# Patient Record
Sex: Male | Born: 1960
Health system: Southern US, Community
[De-identification: ages and names within clinical notes are randomized; demographics above are authoritative.]

## PROBLEM LIST (undated history)

## (undated) ENCOUNTER — Emergency Department (HOSPITAL_COMMUNITY): Admission: EM | Payer: Medicaid Other | Source: Home / Self Care

## (undated) DIAGNOSIS — I10 Essential (primary) hypertension: Secondary | ICD-10-CM

## (undated) DIAGNOSIS — M1990 Unspecified osteoarthritis, unspecified site: Secondary | ICD-10-CM

## (undated) DIAGNOSIS — F1410 Cocaine abuse, uncomplicated: Secondary | ICD-10-CM

## (undated) DIAGNOSIS — G8929 Other chronic pain: Secondary | ICD-10-CM

## (undated) DIAGNOSIS — T783XXA Angioneurotic edema, initial encounter: Secondary | ICD-10-CM

## (undated) DIAGNOSIS — E7800 Pure hypercholesterolemia, unspecified: Secondary | ICD-10-CM

## (undated) DIAGNOSIS — G629 Polyneuropathy, unspecified: Secondary | ICD-10-CM

## (undated) DIAGNOSIS — E1140 Type 2 diabetes mellitus with diabetic neuropathy, unspecified: Secondary | ICD-10-CM

## (undated) DIAGNOSIS — E119 Type 2 diabetes mellitus without complications: Secondary | ICD-10-CM

## (undated) DIAGNOSIS — M549 Dorsalgia, unspecified: Principal | ICD-10-CM

## (undated) HISTORY — PX: INCISION AND DRAINAGE OF WOUND: SHX1803

## (undated) HISTORY — PX: BACK SURGERY: SHX140

## (undated) HISTORY — PX: TONSILLECTOMY: SUR1361

---

## 1998-08-23 ENCOUNTER — Emergency Department (HOSPITAL_COMMUNITY): Admission: EM | Admit: 1998-08-23 | Discharge: 1998-08-23 | Payer: Self-pay | Admitting: Emergency Medicine

## 1998-08-23 ENCOUNTER — Encounter: Payer: Self-pay | Admitting: Emergency Medicine

## 2008-04-21 ENCOUNTER — Emergency Department (HOSPITAL_COMMUNITY): Admission: EM | Admit: 2008-04-21 | Discharge: 2008-04-21 | Payer: Self-pay | Admitting: Emergency Medicine

## 2008-07-11 ENCOUNTER — Ambulatory Visit: Payer: Self-pay | Admitting: Internal Medicine

## 2008-07-12 ENCOUNTER — Ambulatory Visit: Payer: Self-pay | Admitting: Internal Medicine

## 2008-07-12 ENCOUNTER — Ambulatory Visit: Payer: Self-pay | Admitting: *Deleted

## 2008-07-14 ENCOUNTER — Inpatient Hospital Stay (HOSPITAL_COMMUNITY): Admission: EM | Admit: 2008-07-14 | Discharge: 2008-07-20 | Payer: Self-pay | Admitting: Emergency Medicine

## 2008-07-21 ENCOUNTER — Ambulatory Visit: Payer: Self-pay | Admitting: Internal Medicine

## 2008-07-22 ENCOUNTER — Ambulatory Visit (HOSPITAL_COMMUNITY): Admission: RE | Admit: 2008-07-22 | Discharge: 2008-07-22 | Payer: Self-pay | Admitting: Internal Medicine

## 2008-07-22 ENCOUNTER — Ambulatory Visit: Payer: Self-pay | Admitting: Internal Medicine

## 2008-07-25 ENCOUNTER — Ambulatory Visit: Payer: Self-pay | Admitting: Internal Medicine

## 2008-08-10 ENCOUNTER — Ambulatory Visit: Payer: Self-pay | Admitting: Internal Medicine

## 2008-08-25 ENCOUNTER — Ambulatory Visit: Payer: Self-pay | Admitting: Internal Medicine

## 2010-03-19 ENCOUNTER — Emergency Department (HOSPITAL_COMMUNITY)
Admission: EM | Admit: 2010-03-19 | Discharge: 2010-03-19 | Payer: Self-pay | Source: Home / Self Care | Admitting: Emergency Medicine

## 2010-03-26 LAB — COMPREHENSIVE METABOLIC PANEL
ALT: 16 U/L (ref 0–53)
AST: 22 U/L (ref 0–37)
Albumin: 4 g/dL (ref 3.5–5.2)
Alkaline Phosphatase: 86 U/L (ref 39–117)
BUN: 15 mg/dL (ref 6–23)
CO2: 27 mEq/L (ref 19–32)
Calcium: 9.3 mg/dL (ref 8.4–10.5)
Chloride: 109 mEq/L (ref 96–112)
Creatinine, Ser: 1.06 mg/dL (ref 0.4–1.5)
GFR calc Af Amer: >60 mL/min (ref >60)
GFR calc non Af Amer: >60 mL/min (ref >60)
Glucose, Bld: 115 mg/dL — ABNORMAL HIGH (ref 70–99)
Potassium: 4.3 mEq/L (ref 3.5–5.1)
Sodium: 142 mEq/L (ref 135–145)
Total Bilirubin: 0.6 mg/dL (ref 0.3–1.2)
Total Protein: 7.8 g/dL (ref 6.0–8.3)

## 2010-03-26 LAB — URINALYSIS, ROUTINE W REFLEX MICROSCOPIC
Bilirubin Urine: NEGATIVE
Hgb urine dipstick: NEGATIVE
Ketones, ur: NEGATIVE mg/dL
Nitrite: NEGATIVE
Protein, ur: NEGATIVE mg/dL
Specific Gravity, Urine: 1.025 (ref 1.005–1.030)
Urine Glucose, Fasting: NEGATIVE mg/dL
Urobilinogen, UA: 0.2 mg/dL (ref 0.0–1.0)
pH: 5 (ref 5.0–8.0)

## 2010-03-26 LAB — DIFFERENTIAL
Basophils Absolute: 0 10*3/uL (ref 0.0–0.1)
Basophils Relative: 0 % (ref 0–1)
Eosinophils Absolute: 0.2 10*3/uL (ref 0.0–0.7)
Eosinophils Relative: 2 % (ref 0–5)
Lymphocytes Relative: 7 % — ABNORMAL LOW (ref 12–46)
Lymphs Abs: 1 10*3/uL (ref 0.7–4.0)
Monocytes Absolute: 0.7 10*3/uL (ref 0.1–1.0)
Monocytes Relative: 5 % (ref 3–12)
Neutro Abs: 13 10*3/uL — ABNORMAL HIGH (ref 1.7–7.7)
Neutrophils Relative %: 87 % — ABNORMAL HIGH (ref 43–77)

## 2010-03-26 LAB — CBC
HCT: 45.2 % (ref 39.0–52.0)
Hemoglobin: 14.9 g/dL (ref 13.0–17.0)
MCH: 31.3 pg (ref 26.0–34.0)
MCHC: 33 g/dL (ref 30.0–36.0)
MCV: 95 fL (ref 78.0–100.0)
Platelets: 418 10*3/uL — ABNORMAL HIGH (ref 150–400)
RBC: 4.76 MIL/uL (ref 4.22–5.81)
RDW: 13.4 % (ref 11.5–15.5)
WBC: 14.9 10*3/uL — ABNORMAL HIGH (ref 4.0–10.5)

## 2010-03-26 LAB — LIPASE, BLOOD: Lipase: 25 U/L (ref 11–59)

## 2010-03-26 LAB — GLUCOSE, CAPILLARY: Glucose-Capillary: 123 mg/dL — ABNORMAL HIGH (ref 70–99)

## 2010-06-19 LAB — CBC
HCT: 39.4 % (ref 39.0–52.0)
HCT: 40.2 % (ref 39.0–52.0)
Hemoglobin: 13.5 g/dL (ref 13.0–17.0)
MCHC: 34.2 g/dL (ref 30.0–36.0)
MCV: 91.6 fL (ref 78.0–100.0)
MCV: 91.8 fL (ref 78.0–100.0)
Platelets: 279 10*3/uL (ref 150–400)
Platelets: 289 10*3/uL (ref 150–400)
Platelets: 312 10*3/uL (ref 150–400)
RBC: 4.18 MIL/uL — ABNORMAL LOW (ref 4.22–5.81)
RBC: 4.3 MIL/uL (ref 4.22–5.81)
RDW: 13.9 % (ref 11.5–15.5)
RDW: 14.3 % (ref 11.5–15.5)
WBC: 5.1 10*3/uL (ref 4.0–10.5)
WBC: 6 10*3/uL (ref 4.0–10.5)

## 2010-06-19 LAB — COMPREHENSIVE METABOLIC PANEL
ALT: 143 U/L — ABNORMAL HIGH (ref 0–53)
ALT: 152 U/L — ABNORMAL HIGH (ref 0–53)
ALT: 155 U/L — ABNORMAL HIGH (ref 0–53)
ALT: 176 U/L — ABNORMAL HIGH (ref 0–53)
AST: 202 U/L — ABNORMAL HIGH (ref 0–37)
AST: 203 U/L — ABNORMAL HIGH (ref 0–37)
AST: 97 U/L — ABNORMAL HIGH (ref 0–37)
Albumin: 2.9 g/dL — ABNORMAL LOW (ref 3.5–5.2)
Albumin: 2.9 g/dL — ABNORMAL LOW (ref 3.5–5.2)
Albumin: 3.1 g/dL — ABNORMAL LOW (ref 3.5–5.2)
Albumin: 3.2 g/dL — ABNORMAL LOW (ref 3.5–5.2)
Alkaline Phosphatase: 259 U/L — ABNORMAL HIGH (ref 39–117)
Alkaline Phosphatase: 286 U/L — ABNORMAL HIGH (ref 39–117)
Alkaline Phosphatase: 343 U/L — ABNORMAL HIGH (ref 39–117)
Alkaline Phosphatase: 421 U/L — ABNORMAL HIGH (ref 39–117)
BUN: 11 mg/dL (ref 6–23)
BUN: 12 mg/dL (ref 6–23)
BUN: 16 mg/dL (ref 6–23)
CO2: 25 mEq/L (ref 19–32)
CO2: 25 mEq/L (ref 19–32)
CO2: 26 mEq/L (ref 19–32)
CO2: 29 mEq/L (ref 19–32)
Calcium: 9.2 mg/dL (ref 8.4–10.5)
Calcium: 9.4 mg/dL (ref 8.4–10.5)
Chloride: 100 mEq/L (ref 96–112)
Chloride: 103 mEq/L (ref 96–112)
Chloride: 107 mEq/L (ref 96–112)
Chloride: 98 mEq/L (ref 96–112)
Chloride: 99 mEq/L (ref 96–112)
Creatinine, Ser: 0.87 mg/dL (ref 0.4–1.5)
Creatinine, Ser: 0.92 mg/dL (ref 0.4–1.5)
Creatinine, Ser: 0.96 mg/dL (ref 0.4–1.5)
Creatinine, Ser: 1.19 mg/dL (ref 0.4–1.5)
GFR calc Af Amer: >60 mL/min (ref >60)
GFR calc Af Amer: >60 mL/min (ref >60)
GFR calc Af Amer: >60 mL/min (ref >60)
GFR calc non Af Amer: >60 mL/min (ref >60)
GFR calc non Af Amer: >60 mL/min (ref >60)
GFR calc non Af Amer: >60 mL/min (ref >60)
GFR calc non Af Amer: >60 mL/min (ref >60)
Glucose, Bld: 127 mg/dL — ABNORMAL HIGH (ref 70–99)
Glucose, Bld: 141 mg/dL — ABNORMAL HIGH (ref 70–99)
Glucose, Bld: 206 mg/dL — ABNORMAL HIGH (ref 70–99)
Glucose, Bld: 218 mg/dL — ABNORMAL HIGH (ref 70–99)
Potassium: 4.1 mEq/L (ref 3.5–5.1)
Potassium: 4.3 mEq/L (ref 3.5–5.1)
Potassium: 4.5 mEq/L (ref 3.5–5.1)
Potassium: 4.6 mEq/L (ref 3.5–5.1)
Sodium: 133 mEq/L — ABNORMAL LOW (ref 135–145)
Sodium: 138 mEq/L (ref 135–145)
Total Bilirubin: 1.2 mg/dL (ref 0.3–1.2)
Total Bilirubin: 1.8 mg/dL — ABNORMAL HIGH (ref 0.3–1.2)
Total Bilirubin: 1.8 mg/dL — ABNORMAL HIGH (ref 0.3–1.2)
Total Bilirubin: 2.2 mg/dL — ABNORMAL HIGH (ref 0.3–1.2)
Total Bilirubin: 2.4 mg/dL — ABNORMAL HIGH (ref 0.3–1.2)
Total Bilirubin: 2.8 mg/dL — ABNORMAL HIGH (ref 0.3–1.2)
Total Protein: 7.1 g/dL (ref 6.0–8.3)
Total Protein: 7.3 g/dL (ref 6.0–8.3)

## 2010-06-19 LAB — HEPATITIS PANEL, ACUTE
HCV Ab: NEGATIVE
Hep A IgM: NEGATIVE
Hep B C IgM: NEGATIVE
Hepatitis B Surface Ag: NEGATIVE
Hepatitis B Surface Ag: NEGATIVE

## 2010-06-19 LAB — CULTURE, BLOOD (ROUTINE X 2)
Culture: NO GROWTH
Culture: NO GROWTH

## 2010-06-19 LAB — GLUCOSE, CAPILLARY
Glucose-Capillary: 109 mg/dL — ABNORMAL HIGH (ref 70–99)
Glucose-Capillary: 116 mg/dL — ABNORMAL HIGH (ref 70–99)
Glucose-Capillary: 148 mg/dL — ABNORMAL HIGH (ref 70–99)
Glucose-Capillary: 150 mg/dL — ABNORMAL HIGH (ref 70–99)
Glucose-Capillary: 151 mg/dL — ABNORMAL HIGH (ref 70–99)
Glucose-Capillary: 162 mg/dL — ABNORMAL HIGH (ref 70–99)
Glucose-Capillary: 170 mg/dL — ABNORMAL HIGH (ref 70–99)
Glucose-Capillary: 206 mg/dL — ABNORMAL HIGH (ref 70–99)
Glucose-Capillary: 210 mg/dL — ABNORMAL HIGH (ref 70–99)
Glucose-Capillary: 214 mg/dL — ABNORMAL HIGH (ref 70–99)
Glucose-Capillary: 234 mg/dL — ABNORMAL HIGH (ref 70–99)
Glucose-Capillary: 238 mg/dL — ABNORMAL HIGH (ref 70–99)
Glucose-Capillary: 240 mg/dL — ABNORMAL HIGH (ref 70–99)
Glucose-Capillary: 296 mg/dL — ABNORMAL HIGH (ref 70–99)
Glucose-Capillary: 303 mg/dL — ABNORMAL HIGH (ref 70–99)

## 2010-06-19 LAB — HEPATIC FUNCTION PANEL
ALT: 174 U/L — ABNORMAL HIGH (ref 0–53)
AST: 131 U/L — ABNORMAL HIGH (ref 0–37)
Albumin: 2.9 g/dL — ABNORMAL LOW (ref 3.5–5.2)
Bilirubin, Direct: 0.5 mg/dL — ABNORMAL HIGH (ref 0.0–0.3)
Total Bilirubin: 1.4 mg/dL — ABNORMAL HIGH (ref 0.3–1.2)

## 2010-06-19 LAB — DIFFERENTIAL
Basophils Absolute: 0 10*3/uL (ref 0.0–0.1)
Basophils Absolute: 0 10*3/uL (ref 0.0–0.1)
Basophils Absolute: 0 10*3/uL (ref 0.0–0.1)
Basophils Relative: 0 % (ref 0–1)
Basophils Relative: 0 % (ref 0–1)
Eosinophils Absolute: 0 10*3/uL (ref 0.0–0.7)
Eosinophils Absolute: 0 10*3/uL (ref 0.0–0.7)
Eosinophils Relative: 1 % (ref 0–5)
Eosinophils Relative: 1 % (ref 0–5)
Lymphocytes Relative: 18 % (ref 12–46)
Lymphocytes Relative: 44 % (ref 12–46)
Lymphocytes Relative: 8 % — ABNORMAL LOW (ref 12–46)
Lymphs Abs: 0.5 10*3/uL — ABNORMAL LOW (ref 0.7–4.0)
Monocytes Absolute: 0.2 10*3/uL (ref 0.1–1.0)
Monocytes Absolute: 0.5 10*3/uL (ref 0.1–1.0)
Monocytes Absolute: 0.9 10*3/uL (ref 0.1–1.0)
Monocytes Relative: 14 % — ABNORMAL HIGH (ref 3–12)
Monocytes Relative: 4 % (ref 3–12)
Neutro Abs: 2.6 10*3/uL (ref 1.7–7.7)
Neutro Abs: 5.3 10*3/uL (ref 1.7–7.7)
Neutrophils Relative %: 88 % — ABNORMAL HIGH (ref 43–77)

## 2010-06-19 LAB — URINALYSIS, ROUTINE W REFLEX MICROSCOPIC
Glucose, UA: NEGATIVE mg/dL
Ketones, ur: 15 mg/dL — AB
Nitrite: NEGATIVE
Protein, ur: 100 mg/dL — AB
Specific Gravity, Urine: 1.025 (ref 1.005–1.030)
Urobilinogen, UA: 4 mg/dL — ABNORMAL HIGH (ref 0.0–1.0)
pH: 7 (ref 5.0–8.0)

## 2010-06-19 LAB — URINE MICROSCOPIC-ADD ON

## 2010-06-19 LAB — UIFE/LIGHT CHAINS/TP QN, 24-HR UR
Free Kappa Lt Chains,Ur: 6.23 mg/dL — ABNORMAL HIGH (ref 0.04–1.51)
Free Kappa/Lambda Ratio: 15.58 ratio — ABNORMAL HIGH (ref 0.46–4.00)
Free Lambda Excretion/Day: 8.2 mg/d
Free Lt Chn Excr Rate: 127.72 mg/d
Gamma Globulin, Urine: DETECTED — AB
Time: 24 hours
Total Protein, Urine-Ur/day: 146 mg/d — ABNORMAL HIGH (ref 10–140)
Volume, Urine: 2050 mL

## 2010-06-19 LAB — PROTIME-INR: INR: 1 (ref 0.00–1.49)

## 2010-06-19 LAB — PROTEIN ELECTROPH W RFLX QUANT IMMUNOGLOBULINS
Alpha-2-Globulin: 13.8 % — ABNORMAL HIGH (ref 7.1–11.8)
Beta 2: 6 % (ref 3.2–6.5)
Gamma Globulin: 17.7 % (ref 11.1–18.8)
M-Spike, %: NOT DETECTED g/dL

## 2010-06-19 LAB — LIPASE, BLOOD: Lipase: 30 U/L (ref 11–59)

## 2010-07-24 NOTE — H&P (Signed)
David Wyatt, BOTTOMLEY               ACCOUNT NO.:  [id number]   MEDICAL RECORD NO.:  [id number]          PATIENT TYPE:  INP   LOCATION:  4742                         FACILITY:  [facility]   PHYSICIAN:  Monte Fantasia, MD  DATE OF BIRTH:  04-Sep-1960   DATE OF ADMISSION:  07/14/2008  DATE OF DISCHARGE:                              HISTORY & PHYSICAL   PRIMARY CARE PHYSICIAN:  Unassigned.   HISTORY OF PRESENT ILLNESS:  A 50 year old African American male patient  was admitted for complaints of intractable nausea and vomiting.  History  was provided by the patient himself.  The patient stated that he had  been to the [facility] few days back for boil on his back for which he  had undergone an I&D.  The patient was checked for his sugars in the  [facility], which was found to be high of 267 and was told that  he was diabetic.  The patient was prescribed Glucophage for the same.  As per the patient, the patient started having complaints of nausea and  vomiting in past 2 days when he was taking the Glucophage.  The patient  also complains of generalized abdominal pain associated with his nausea  and vomiting and increased heartburn since the symptoms have started.  The patient had multiple episodes of bilious vomiting and no complains  of passing dark colored orange urine.  As per the patient, the patient  said he was febrile today evening when he was visiting his mother and  his temperature was 102 at that time for which his mother brought him to  the hospital.  The patient also complains of cough with productive  phlegm for past few days.  States that the color of the phlegm is brown  to black in color.  No history of loss of weight or loss of appetite.  No history of similar complaints in the past.  The patient stays in the  shelter for quite some time now.  Does not remember he is PPD status at  present.  Denies any complaints of diaphoresis.  Denies any complaints  of chest pain.   Denies any complaints of shortness of breath.  No  complaints of diarrhea; however, complains of constipation for past 5  days.  No complaints of pain on passing urine or suprapubic pain.   REVIEW OF SYSTEMS:  Review of 12-point systems have been negative other  than those mentioned in the HPI.   PAST MEDICAL HISTORY:  Significant for diabetes.   SOCIAL HISTORY:  The patient smokes, has a history of 25 years back per  year history, has been smoking almost a pack of cigarettes, 1 a day and  has been smoking since the age of 14 years, currently at present, but  for past few days he has been smoking 3-4 cigarettes a day.   ALLERGIES:  No known drug allergies.   HOME MEDICATIONS:  Glucophage, dose is not known.   PHYSICAL EXAMINATION:  VITALS:  Temperature of 102.5, blood pressure  129/78, heart rate of 93, respiratory rate of 20, oxygen saturation 100%  on room  air.  GENERAL:  The patient is comfortable, not in any respiratory stress.  Averagely built and nourished.  HEENT:  Pupils equal, reacting to light.  Mild icterus.  No pallor.  NECK:  Supple.  No JVD.  No carotid bruits.  RESPIRATORY:  Air entry is bilaterally equal.  Diffuse expiratory  rhonchi plus.  CARDIOVASCULAR:  S1 and S2.  Regular rate and rhythm.  No murmurs heard.  ABDOMEN:  Soft.  No guarding, no rigidity.  Minimal epigastric  tenderness.  No distention.  No masses felt.  EXTREMITIES:  No edema of feet.  SKIN:  Intact.  No evidence of any skin rashes.  CNS:  The patient is alert, awake, oriented x3.  No focal neurological  deficits.   RADIOLOGICAL INVESTIGATIONS DONE AT THE TIME OF ADMISSION:  1. Abdominal x-ray.  Impression:  No acute cardiopulmonary process      identified.  No pneumoperitoneum or small bowel obstruction seen.      Moderate fecal and gaseous distention of the colon.  2. Ultrasound of the abdomen done on Jul 14, 2008.  Impression:  Small      simple renal cyst.  Gallbladder appears to be  contracted, 3-mm      polyp in the gallbladder more likely simply the presence of fold in      the contracted gallbladder, no pericholecystic fluid.  Sonographic      Murphy sign.  No gallstones identified.   LABS DONE AT THE TIME OF ADMISSION:  Total WBC 6.0, hemoglobin 13.5,  hematocrit 39.4, platelets of 279.  Sodium 133, potassium 4.6, chloride  98, bicarb 25, glucose 218, BUN 11, creatinine 1.19, total bilirubin  2.8, alkaline phosphatase 259, AST 203, ALT 155, total protein 7.1,  albumin 3.2, calcium 9.2, lipase 30.  UA, bilirubin is large, nitrite  negative, leukocytes small, wbc 0-2, bacteria rare.   ASSESSMENT AND PLAN:  1. Intractable nausea and vomiting.  2. Deranged LFTs.  3. Diabetes.  4. Fever to rule out pneumonia.  5. Complains of hemoptysis to rule out tuberculosis.   We will admit the patient to [facility].  We will keep the  patient n.p.o. with D5 half normal saline at 100 mL per hour.  We will  need to check his CBC, CMET in a.m.  Also we will check further his  hepatitis profile.  We will get a CT scan of the abdomen and pelvis with  contrast.   In view of his fever, the patient's white count normal.  The patient has  a temperature of 102.5 on admission.  We would check for respiratory  cultures, blood cultures.  Also we will need to check for his PPD status  and sputum maybe x3.  We will need to keep the patient in reverse  isolation.  We would start the patient on Avelox 500 mg daily.  We would  also need to check his chest x-ray.   In view of his diabetes, we will keep the patient on sliding-scale  insulin without coverage.  If blood sugars more than 150 may need  insulin coverage for the same.   DVT and GI prophylaxis on Lovenox and Protonix.   In view of the nausea and vomiting, the patient is on Reglan and  Phenergan p.r.n. nausea and vomiting.   Total time for admission of 50 minutes.      Monte Fantasia, MD  Electronically  Signed     MP/MEDQ  D:  07/14/2008  T:  07/15/2008  Job:  [id number]

## 2010-07-24 NOTE — Discharge Summary (Signed)
David Wyatt, David Wyatt               ACCOUNT NO.:  [id number]   MEDICAL RECORD NO.:  [id number]          PATIENT TYPE:  INP   LOCATION:  5019                         FACILITY:  [facility]   PHYSICIAN:  Pedro Earls, MD     DATE OF BIRTH:  1960/12/14   DATE OF ADMISSION:  07/14/2008  DATE OF DISCHARGE:  07/20/2008                               DISCHARGE SUMMARY   DISCHARGE DIAGNOSES:  1. Abnormal LFTs.  2. Diabetes.  3. Intractable nausea and vomiting.   HOSPITAL COURSE:  This is a 50 year old African American male patient  with a past medical history significant for diabetes who presented on  Jul 14, 2008, with a chief complaint of intractable nausea and vomiting.  The patient was admitted, was kept n.p.o. initially and was started on  D5 half normal at 100 mL per hour.  The patient continued to have  elevated transaminases, although the numbers were coming down.  On May  7, the bili came down to 2.2, ALT came down to 143, and alkaline  phosphatase was 315.   The patient did not have any acute events overnight on the day of  discharge.  The patient's SPEP was abnormal and recommendation to follow  up with the [facility], also to follow up on the LFTs and repeat the  LFTs in 1 week.  The patient's metformin was stopped during the  hospitalization and was started on Lantus for which the patient will be  given a prescription along with lancet, syringes, glucometer, and test  strips.   DISCHARGE MEDICATIONS:  Lantus 20 units subcu nightly.      Pedro Earls, MD  Electronically Signed     NS/MEDQ  D:  07/20/2008  T:  07/21/2008  Job:  [id number]   cc:   Dala Dock

## 2010-07-27 ENCOUNTER — Emergency Department (HOSPITAL_COMMUNITY)
Admission: EM | Admit: 2010-07-27 | Discharge: 2010-07-27 | Disposition: A | Discharge status: Home / Self Care | Payer: Self-pay | Attending: Emergency Medicine | Admitting: Emergency Medicine

## 2010-07-27 DIAGNOSIS — M545 Low back pain, unspecified: Secondary | ICD-10-CM | POA: Insufficient documentation

## 2010-07-27 DIAGNOSIS — Z794 Long term (current) use of insulin: Secondary | ICD-10-CM | POA: Insufficient documentation

## 2010-07-27 DIAGNOSIS — E119 Type 2 diabetes mellitus without complications: Secondary | ICD-10-CM | POA: Insufficient documentation

## 2010-11-12 ENCOUNTER — Emergency Department (HOSPITAL_COMMUNITY)
Admission: EM | Admit: 2010-11-12 | Discharge: 2010-11-12 | Disposition: A | Discharge status: Home / Self Care | Payer: Self-pay | Attending: Emergency Medicine | Admitting: Emergency Medicine

## 2010-11-12 ENCOUNTER — Emergency Department (HOSPITAL_COMMUNITY): Payer: Self-pay

## 2010-11-12 DIAGNOSIS — R29898 Other symptoms and signs involving the musculoskeletal system: Secondary | ICD-10-CM | POA: Insufficient documentation

## 2010-11-12 DIAGNOSIS — R609 Edema, unspecified: Secondary | ICD-10-CM | POA: Insufficient documentation

## 2010-11-12 DIAGNOSIS — M25469 Effusion, unspecified knee: Secondary | ICD-10-CM | POA: Insufficient documentation

## 2010-11-12 DIAGNOSIS — E119 Type 2 diabetes mellitus without complications: Secondary | ICD-10-CM | POA: Insufficient documentation

## 2010-11-12 DIAGNOSIS — M25569 Pain in unspecified knee: Secondary | ICD-10-CM | POA: Insufficient documentation

## 2010-11-12 LAB — DIFFERENTIAL
Basophils Relative: 1 % (ref 0–1)
Lymphocytes Relative: 30 % (ref 12–46)
Lymphs Abs: 1.6 10*3/uL (ref 0.7–4.0)
Monocytes Absolute: 0.6 10*3/uL (ref 0.1–1.0)
Monocytes Relative: 11 % (ref 3–12)
Neutro Abs: 2.8 10*3/uL (ref 1.7–7.7)
Neutrophils Relative %: 54 % (ref 43–77)

## 2010-11-12 LAB — CBC
HCT: 39 % (ref 39.0–52.0)
Hemoglobin: 12.9 g/dL — ABNORMAL LOW (ref 13.0–17.0)
MCH: 30.3 pg (ref 26.0–34.0)
MCHC: 33.1 g/dL (ref 30.0–36.0)
MCV: 91.5 fL (ref 78.0–100.0)
RBC: 4.26 MIL/uL (ref 4.22–5.81)

## 2010-11-12 LAB — BASIC METABOLIC PANEL
BUN: 9 mg/dL (ref 6–23)
CO2: 29 mEq/L (ref 19–32)
Calcium: 9.1 mg/dL (ref 8.4–10.5)
GFR calc non Af Amer: >60 mL/min (ref >60)
Glucose, Bld: 151 mg/dL — ABNORMAL HIGH (ref 70–99)
Sodium: 142 mEq/L (ref 135–145)

## 2010-12-09 ENCOUNTER — Emergency Department (HOSPITAL_COMMUNITY)
Admission: EM | Admit: 2010-12-09 | Discharge: 2010-12-09 | Disposition: A | Discharge status: Home / Self Care | Payer: Self-pay | Attending: Emergency Medicine | Admitting: Emergency Medicine

## 2010-12-09 DIAGNOSIS — M25569 Pain in unspecified knee: Secondary | ICD-10-CM | POA: Insufficient documentation

## 2010-12-09 DIAGNOSIS — M25469 Effusion, unspecified knee: Secondary | ICD-10-CM | POA: Insufficient documentation

## 2010-12-09 DIAGNOSIS — E119 Type 2 diabetes mellitus without complications: Secondary | ICD-10-CM | POA: Insufficient documentation

## 2011-10-16 ENCOUNTER — Emergency Department (INDEPENDENT_AMBULATORY_CARE_PROVIDER_SITE_OTHER)
Admission: EM | Admit: 2011-10-16 | Discharge: 2011-10-16 | Disposition: A | Discharge status: Home / Self Care | Payer: Self-pay | Source: Home / Self Care | Attending: Emergency Medicine | Admitting: Emergency Medicine

## 2011-10-16 ENCOUNTER — Encounter (HOSPITAL_COMMUNITY): Payer: Self-pay

## 2011-10-16 ENCOUNTER — Emergency Department (INDEPENDENT_AMBULATORY_CARE_PROVIDER_SITE_OTHER): Payer: Self-pay

## 2011-10-16 DIAGNOSIS — S60221A Contusion of right hand, initial encounter: Secondary | ICD-10-CM

## 2011-10-16 DIAGNOSIS — S60229A Contusion of unspecified hand, initial encounter: Secondary | ICD-10-CM

## 2011-10-16 MED ORDER — MELOXICAM 7.5 MG PO TABS: 7.5000 mg | ORAL_TABLET | Freq: Every day | ORAL | Status: AC
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
## 2011-12-17 ENCOUNTER — Emergency Department (HOSPITAL_COMMUNITY): Payer: Self-pay

## 2011-12-17 ENCOUNTER — Encounter (HOSPITAL_COMMUNITY): Payer: Self-pay | Admitting: Physical Medicine and Rehabilitation

## 2011-12-17 ENCOUNTER — Emergency Department (HOSPITAL_COMMUNITY)
Admission: EM | Admit: 2011-12-17 | Discharge: 2011-12-17 | Disposition: A | Discharge status: Home / Self Care | Payer: Self-pay | Attending: Emergency Medicine | Admitting: Emergency Medicine

## 2011-12-17 DIAGNOSIS — F17200 Nicotine dependence, unspecified, uncomplicated: Secondary | ICD-10-CM | POA: Insufficient documentation

## 2011-12-17 DIAGNOSIS — E119 Type 2 diabetes mellitus without complications: Secondary | ICD-10-CM | POA: Insufficient documentation

## 2011-12-17 DIAGNOSIS — M25559 Pain in unspecified hip: Secondary | ICD-10-CM | POA: Insufficient documentation

## 2011-12-17 DIAGNOSIS — M25551 Pain in right hip: Secondary | ICD-10-CM

## 2011-12-17 LAB — COMPREHENSIVE METABOLIC PANEL
ALT: 9 U/L (ref 0–53)
AST: 14 U/L (ref 0–37)
Albumin: 3.7 g/dL (ref 3.5–5.2)
Alkaline Phosphatase: 98 U/L (ref 39–117)
CO2: 25 mEq/L (ref 19–32)
Chloride: 106 mEq/L (ref 96–112)
Creatinine, Ser: 0.87 mg/dL (ref 0.50–1.35)
GFR calc non Af Amer: >90 mL/min (ref >90)
Potassium: 4.3 mEq/L (ref 3.5–5.1)
Sodium: 140 mEq/L (ref 135–145)
Total Bilirubin: 0.3 mg/dL (ref 0.3–1.2)

## 2011-12-17 LAB — CBC WITH DIFFERENTIAL/PLATELET
Basophils Absolute: 0 10*3/uL (ref 0.0–0.1)
Basophils Relative: 1 % (ref 0–1)
HCT: 40.8 % (ref 39.0–52.0)
Lymphocytes Relative: 37 % (ref 12–46)
MCHC: 33.6 g/dL (ref 30.0–36.0)
Monocytes Absolute: 0.4 10*3/uL (ref 0.1–1.0)
Neutro Abs: 3.1 10*3/uL (ref 1.7–7.7)
Neutrophils Relative %: 51 % (ref 43–77)
RDW: 13.5 % (ref 11.5–15.5)
WBC: 6.1 10*3/uL (ref 4.0–10.5)

## 2011-12-17 LAB — GLUCOSE, CAPILLARY

## 2011-12-17 MED ORDER — IBUPROFEN 800 MG PO TABS
800.0000 mg | ORAL_TABLET | Freq: Once | ORAL | Status: AC
  Administered 2011-12-17: 800 mg via ORAL

## 2011-12-17 MED ORDER — IBUPROFEN 800 MG PO TABS
800.0000 mg | ORAL_TABLET | Freq: Once | ORAL | Status: DC
  Filled 2011-12-17: qty 1

## 2011-12-17 MED ORDER — IBUPROFEN 800 MG PO TABS: 800.0000 mg | ORAL_TABLET | Freq: Three times a day (TID) | ORAL | Status: DC

## 2011-12-17 NOTE — ED Notes (Addendum)
Pt presents to department for evaluation of R sided arm, groin and abdominal pain. Onset this morning. Denies urinary symptoms. 8/10 pain at the time. CBG 131 in triage. States "I am a diabetic and I ate a lot of food yesterday."

## 2011-12-17 NOTE — ED Provider Notes (Signed)
History     CSN: [id number]  Arrival date & time 12/17/11  1191   First MD Initiated Contact with Patient 12/17/11 [phone]      Chief Complaint  Patient presents with  . Abdominal Pain  . Groin Pain    (Consider location/radiation/quality/duration/timing/severity/associated sxs/prior treatment) Patient is a 51 y.o. male presenting with hip pain. The history is provided by the patient.  Hip Pain This is a new problem. The current episode started yesterday. The problem occurs constantly. The problem has been unchanged. Associated symptoms include arthralgias. Pertinent negatives include no abdominal pain, anorexia, change in bowel habit, chest pain, coughing, fatigue, fever, headaches, nausea, neck pain, numbness or vomiting. Exacerbated by: lifting leg off bed, full weight bearing of right leg. He has tried nothing for the symptoms. The treatment provided no relief.    Past Medical History  Diagnosis Date  . Diabetes mellitus     No past surgical history on file.  History reviewed. No pertinent family history.  History  Substance Use Topics  . Smoking status: Current Every Day Smoker  . Smokeless tobacco: Not on file  . Alcohol Use: No      Review of Systems  Constitutional: Negative for fever and fatigue.  HENT: Negative for neck pain.   Respiratory: Negative for cough and shortness of breath.   Cardiovascular: Negative for chest pain.  Gastrointestinal: Negative for nausea, vomiting, abdominal pain, diarrhea, anorexia and change in bowel habit.  Musculoskeletal: Positive for back pain (across pelvis) and arthralgias.  Neurological: Negative for numbness and headaches.  All other systems reviewed and are negative.    Allergies  Review of patient's allergies indicates no known allergies.  Home Medications  No current outpatient prescriptions on file.  BP 136/84  Pulse 64  Temp 97.9 F (36.6 C) (Oral)  Resp 20  SpO2 97%  Physical Exam  Nursing note and  vitals reviewed. Constitutional: He is oriented to person, place, and time. He appears well-developed and well-nourished. No distress.  HENT:  Head: Normocephalic and atraumatic.  Eyes: Pupils are equal, round, and reactive to light.  Cardiovascular: Normal rate and normal heart sounds.   Pulmonary/Chest: Effort normal and breath sounds normal. No respiratory distress.  Abdominal: Soft. He exhibits no distension. There is no tenderness.  Musculoskeletal: Normal range of motion. He exhibits no edema.       Pain when lifting right leg off bed though able to do this.   Neurological: He is alert and oriented to person, place, and time. No cranial nerve deficit or sensory deficit. He exhibits normal muscle tone. Coordination normal.  Skin: Skin is warm and dry.  Psychiatric: He has a normal mood and affect.    ED Course  Procedures (including critical care time)  Labs Reviewed  GLUCOSE, CAPILLARY - Abnormal; Notable for the following:    Glucose-Capillary 131 (*)     All other components within normal limits  URINALYSIS, ROUTINE W REFLEX MICROSCOPIC  CBC WITH DIFFERENTIAL  COMPREHENSIVE METABOLIC PANEL   Dg Hip Complete Right  12/17/2011  *RADIOLOGY REPORT*  Clinical Data: Right hip pain.  No injury.  RIGHT HIP - COMPLETE 2+ VIEW  Comparison: None.  Findings: Mild flattening medial aspect of the right femoral head with joint space narrowing consistent with degenerative changes with subchondral cystic changes suspected involving the right hip joint.  No definitive findings of avascular necrosis.  Mild degenerative changes lower lumbar spine.  IMPRESSION: Mild flattening medial aspect of the right femoral head with  joint space narrowing consistent with degenerative changes with subchondral cystic changes suspected involving the right hip joint.   Original Report Authenticated By: Fuller Canada, M.D.      1. Right hip pain       MDM  8:00 AM Pt seen and examined. PT states that he  walked around a lot yesterday and developed right hip/groin/low back pain. Pt denies trauma, but with description, concern for hip fracture. Will get XR. PT also mention pain at right 3/4 knuckles. Feel this is likely arthritis and will not XR hand.   8:53 AM XR shows arthritis. Will place patient on a course of motrin.   Daleen Bo, MD 12/17/11 [phone]

## 2011-12-18 NOTE — ED Provider Notes (Signed)
I saw and evaluated the patient, reviewed the resident's note and I agree with the findings and plan.  Arthritis. Ambulatory, pain treated. Home with pcp follow up  1. Right hip pain    Dg Hip Complete Right  12/17/2011  *RADIOLOGY REPORT*  Clinical Data: Right hip pain.  No injury.  RIGHT HIP - COMPLETE 2+ VIEW  Comparison: None.  Findings: Mild flattening medial aspect of the right femoral head with joint space narrowing consistent with degenerative changes with subchondral cystic changes suspected involving the right hip joint.  No definitive findings of avascular necrosis.  Mild degenerative changes lower lumbar spine.  IMPRESSION: Mild flattening medial aspect of the right femoral head with joint space narrowing consistent with degenerative changes with subchondral cystic changes suspected involving the right hip joint.   Original Report Authenticated By: Fuller Canada, M.D.    I personally reviewed the imaging tests through PACS system  I reviewed available ER/hospitalization records thought the EMR   Lyanne Co, MD 12/18/11 [phone]

## 2012-01-26 ENCOUNTER — Emergency Department (HOSPITAL_COMMUNITY)
Admission: EM | Admit: 2012-01-26 | Discharge: 2012-01-26 | Disposition: A | Discharge status: Home / Self Care | Payer: No Typology Code available for payment source | Attending: Emergency Medicine | Admitting: Emergency Medicine

## 2012-01-26 ENCOUNTER — Encounter (HOSPITAL_COMMUNITY): Payer: Self-pay | Admitting: *Deleted

## 2012-01-26 ENCOUNTER — Emergency Department (HOSPITAL_COMMUNITY): Payer: No Typology Code available for payment source

## 2012-01-26 DIAGNOSIS — S79919A Unspecified injury of unspecified hip, initial encounter: Secondary | ICD-10-CM | POA: Insufficient documentation

## 2012-01-26 DIAGNOSIS — S0993XA Unspecified injury of face, initial encounter: Secondary | ICD-10-CM | POA: Insufficient documentation

## 2012-01-26 DIAGNOSIS — S79929A Unspecified injury of unspecified thigh, initial encounter: Secondary | ICD-10-CM | POA: Insufficient documentation

## 2012-01-26 DIAGNOSIS — S46909A Unspecified injury of unspecified muscle, fascia and tendon at shoulder and upper arm level, unspecified arm, initial encounter: Secondary | ICD-10-CM | POA: Insufficient documentation

## 2012-01-26 DIAGNOSIS — E119 Type 2 diabetes mellitus without complications: Secondary | ICD-10-CM | POA: Insufficient documentation

## 2012-01-26 DIAGNOSIS — F17200 Nicotine dependence, unspecified, uncomplicated: Secondary | ICD-10-CM | POA: Insufficient documentation

## 2012-01-26 DIAGNOSIS — Y92410 Unspecified street and highway as the place of occurrence of the external cause: Secondary | ICD-10-CM | POA: Insufficient documentation

## 2012-01-26 DIAGNOSIS — S4980XA Other specified injuries of shoulder and upper arm, unspecified arm, initial encounter: Secondary | ICD-10-CM | POA: Insufficient documentation

## 2012-01-26 DIAGNOSIS — S199XXA Unspecified injury of neck, initial encounter: Secondary | ICD-10-CM | POA: Insufficient documentation

## 2012-01-26 DIAGNOSIS — Y9389 Activity, other specified: Secondary | ICD-10-CM | POA: Insufficient documentation

## 2012-01-26 NOTE — ED Notes (Signed)
Pt states understanding of discharge instructions 
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
## 2012-01-29 NOTE — ED Notes (Signed)
3:07 PM I left a message for [name] regarding the over-read of his XR from three days ago.    David Munch, MD 01/29/12 [phone]

## 2012-01-30 ENCOUNTER — Emergency Department (HOSPITAL_COMMUNITY)
Admission: EM | Admit: 2012-01-30 | Discharge: 2012-01-30 | Disposition: A | Discharge status: Home / Self Care | Payer: No Typology Code available for payment source | Attending: Emergency Medicine | Admitting: Emergency Medicine

## 2012-01-30 ENCOUNTER — Emergency Department (HOSPITAL_COMMUNITY): Payer: No Typology Code available for payment source

## 2012-01-30 DIAGNOSIS — R0789 Other chest pain: Secondary | ICD-10-CM

## 2012-01-30 DIAGNOSIS — R0602 Shortness of breath: Secondary | ICD-10-CM | POA: Insufficient documentation

## 2012-01-30 DIAGNOSIS — R071 Chest pain on breathing: Secondary | ICD-10-CM | POA: Insufficient documentation

## 2012-01-30 DIAGNOSIS — F17200 Nicotine dependence, unspecified, uncomplicated: Secondary | ICD-10-CM | POA: Insufficient documentation

## 2012-01-30 DIAGNOSIS — E119 Type 2 diabetes mellitus without complications: Secondary | ICD-10-CM | POA: Insufficient documentation

## 2012-01-30 DIAGNOSIS — R42 Dizziness and giddiness: Secondary | ICD-10-CM | POA: Insufficient documentation

## 2012-01-30 MED ORDER — OXYCODONE-ACETAMINOPHEN 5-325 MG PO TABS
2.0000 | ORAL_TABLET | Freq: Once | ORAL | Status: AC
  Administered 2012-01-30: 2 via ORAL
  Filled 2012-01-30: qty 2

## 2012-01-30 MED ORDER — OXYCODONE-ACETAMINOPHEN 5-325 MG PO TABS: 2.0000 | ORAL_TABLET | ORAL | Status: DC | PRN
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
## 2012-01-30 NOTE — ED Notes (Signed)
Pt here for follow up of abnormal CXR.

## 2012-02-21 ENCOUNTER — Emergency Department (HOSPITAL_COMMUNITY)
Admission: EM | Admit: 2012-02-21 | Discharge: 2012-02-21 | Disposition: A | Discharge status: Home / Self Care | Payer: No Typology Code available for payment source | Attending: Emergency Medicine | Admitting: Emergency Medicine

## 2012-02-21 ENCOUNTER — Encounter (HOSPITAL_COMMUNITY): Payer: Self-pay | Admitting: Emergency Medicine

## 2012-02-21 DIAGNOSIS — E119 Type 2 diabetes mellitus without complications: Secondary | ICD-10-CM | POA: Insufficient documentation

## 2012-02-21 DIAGNOSIS — F17200 Nicotine dependence, unspecified, uncomplicated: Secondary | ICD-10-CM | POA: Insufficient documentation

## 2012-02-21 DIAGNOSIS — K4090 Unilateral inguinal hernia, without obstruction or gangrene, not specified as recurrent: Secondary | ICD-10-CM

## 2012-02-21 MED ORDER — OXYCODONE-ACETAMINOPHEN 5-325 MG PO TABS
2.0000 | ORAL_TABLET | Freq: Once | ORAL | Status: AC
  Administered 2012-02-21: 2 via ORAL
  Filled 2012-02-21: qty 2

## 2012-02-21 MED ORDER — KETOROLAC TROMETHAMINE 60 MG/2ML IM SOLN
60.0000 mg | Freq: Once | INTRAMUSCULAR | Status: AC
  Administered 2012-02-21: 60 mg via INTRAMUSCULAR
  Filled 2012-02-21: qty 2

## 2012-02-21 MED ORDER — NAPROXEN 500 MG PO TABS: 500.0000 mg | ORAL_TABLET | Freq: Two times a day (BID) | ORAL | Status: DC

## 2012-02-21 NOTE — ED Notes (Signed)
Pt c/o right groin pain and knot noted today; pt sts increased pain with movement

## 2012-02-21 NOTE — ED Notes (Signed)
No answer

## 2012-02-21 NOTE — ED Provider Notes (Signed)
History     CSN: [id number]  Arrival date & time 02/21/12  1806   First MD Initiated Contact with Patient 02/21/12 2303      Chief Complaint  Patient presents with  . Groin Pain    (Consider location/radiation/quality/duration/timing/severity/associated sxs/prior treatment) HPI Comments: 51 year old male presents with a complaint of right inguinal pain after a fit of heavy coughing today. He states that he has never had this before, it was acute in onset, lasted several hours and he tried to push it back in but could not get them asked to go back in. He finally laid on his back and apply gentle pressure after which time the inguinal mass returned to normal. He denies any testicular pain, urinary symptoms, fevers nausea or vomiting. The symptoms lasted several hours, resolved with mild pressure and time, are not associated with fevers or vomiting. The patient also notes that he had a hard stool in the last couple of days but has not been straining with bowel movements  Patient is a 51 y.o. male presenting with groin pain. The history is provided by the patient.  Groin Pain    Past Medical History  Diagnosis Date  . Diabetes mellitus     History reviewed. No pertinent past surgical history.  History reviewed. No pertinent family history.  History  Substance Use Topics  . Smoking status: Current Every Day Smoker  . Smokeless tobacco: Not on file  . Alcohol Use: No      Review of Systems  All other systems reviewed and are negative.    Allergies  Review of patient's allergies indicates no known allergies.  Home Medications   Current Outpatient Rx  Name  Route  Sig  Dispense  Refill  . NAPROXEN 500 MG PO TABS   Oral   Take 1 tablet (500 mg total) by mouth 2 (two) times daily with a meal.   30 tablet   0     BP 148/90  Pulse 67  Temp 98.4 F (36.9 C) (Oral)  Resp 20  SpO2 96%  Physical Exam  Nursing note and vitals reviewed. Constitutional: He appears  well-developed and well-nourished. No distress.  HENT:  Head: Normocephalic and atraumatic.  Mouth/Throat: Oropharynx is clear and moist. No oropharyngeal exudate.  Eyes: Conjunctivae normal and EOM are normal. Pupils are equal, round, and reactive to light. Right eye exhibits no discharge. Left eye exhibits no discharge. No scleral icterus.  Neck: Normal range of motion. Neck supple. No JVD present. No thyromegaly present.  Cardiovascular: Normal rate, regular rhythm, normal heart sounds and intact distal pulses.  Exam reveals no gallop and no friction rub.   No murmur heard. Pulmonary/Chest: Effort normal and breath sounds normal. No respiratory distress. He has no wheezes. He has no rales.  Abdominal: Soft. Bowel sounds are normal. He exhibits no distension and no mass. There is no tenderness.       No inguinal hernias palpated on exam, normal appearing inguinal region  Genitourinary:       Normal appearing penis testicles and scrotum, no hernias palpated  Musculoskeletal: Normal range of motion. He exhibits no edema and no tenderness.  Lymphadenopathy:    He has no cervical adenopathy.  Neurological: He is alert. Coordination normal.  Skin: Skin is warm and dry. No rash noted. No erythema.  Psychiatric: He has a normal mood and affect. His behavior is normal.    ED Course  Procedures (including critical care time)  Labs Reviewed - No data  to display No results found.   1. Inguinal hernia, right       MDM  At this time the patient does not have any inguinal mass, he has a normal appearing scrotum and testicles, he can be treated with anti-inflammatories and he is now aware of how to reduce this should happen again, general surgery referral given.        Vida Roller, MD 02/21/12 [phone]

## 2012-03-08 ENCOUNTER — Emergency Department (HOSPITAL_COMMUNITY)
Admission: EM | Admit: 2012-03-08 | Discharge: 2012-03-09 | Disposition: A | Discharge status: Home / Self Care | Payer: Self-pay | Attending: Emergency Medicine | Admitting: Emergency Medicine

## 2012-03-08 ENCOUNTER — Encounter (HOSPITAL_COMMUNITY): Payer: Self-pay | Admitting: *Deleted

## 2012-03-08 DIAGNOSIS — E119 Type 2 diabetes mellitus without complications: Secondary | ICD-10-CM | POA: Insufficient documentation

## 2012-03-08 DIAGNOSIS — F17200 Nicotine dependence, unspecified, uncomplicated: Secondary | ICD-10-CM | POA: Insufficient documentation

## 2012-03-08 DIAGNOSIS — K4090 Unilateral inguinal hernia, without obstruction or gangrene, not specified as recurrent: Secondary | ICD-10-CM | POA: Insufficient documentation

## 2012-03-08 NOTE — ED Notes (Signed)
Pt c/o lower right groin pain; states has a hernia

## 2012-03-09 LAB — URINALYSIS, ROUTINE W REFLEX MICROSCOPIC
Bilirubin Urine: NEGATIVE
Glucose, UA: NEGATIVE mg/dL
Hgb urine dipstick: NEGATIVE
Protein, ur: NEGATIVE mg/dL
Urobilinogen, UA: 1 mg/dL (ref 0.0–1.0)

## 2012-03-09 LAB — GLUCOSE, CAPILLARY: Glucose-Capillary: 126 mg/dL — ABNORMAL HIGH (ref 70–99)

## 2012-03-09 MED ORDER — HYDROCODONE-ACETAMINOPHEN 5-325 MG PO TABS
1.0000 | ORAL_TABLET | Freq: Once | ORAL | Status: AC
  Administered 2012-03-09: 1 via ORAL
  Filled 2012-03-09: qty 1

## 2012-03-09 MED ORDER — PROMETHAZINE HCL 25 MG PO TABS: 25.0000 mg | ORAL_TABLET | Freq: Four times a day (QID) | ORAL | Status: DC | PRN

## 2012-03-09 MED ORDER — HYDROCODONE-ACETAMINOPHEN 5-325 MG PO TABS: 1.0000 | ORAL_TABLET | ORAL | Status: DC | PRN

## 2012-03-09 NOTE — ED Provider Notes (Signed)
History     CSN: [id number]  Arrival date & time 03/08/12  2309   First MD Initiated Contact with Patient 03/09/12 0256      Chief Complaint  Patient presents with  . Groin Pain    (Consider location/radiation/quality/duration/timing/severity/associated sxs/prior treatment) HPI History provided by pt.   Per prior chart, pt diagnosed w/ R inguinal hernia in ED on 02/21/12.  Approx 1 week ago, it became painful and is popping out more frequently, particularly when he stands up, coughs or laughs.  He is able to manually reduce.  Associated w/ N/V and constipation, described as hard stools.  Denies fever and GU sx.  Also c/o mass on upper back that has been there for years and is non-painful and stable.  Past Medical History  Diagnosis Date  . Diabetes mellitus     History reviewed. No pertinent past surgical history.  No family history on file.  History  Substance Use Topics  . Smoking status: Current Every Day Smoker  . Smokeless tobacco: Not on file  . Alcohol Use: No      Review of Systems  All other systems reviewed and are negative.    Allergies  Review of patient's allergies indicates no known allergies.  Home Medications  No current outpatient prescriptions on file.  BP 133/77  Pulse 79  Temp 98.2 F (36.8 C) (Oral)  Resp 16  SpO2 98%  Physical Exam  Nursing note and vitals reviewed. Constitutional: He is oriented to person, place, and time. He appears well-developed and well-nourished. No distress.  HENT:  Head: Normocephalic and atraumatic.  Eyes:       Normal appearance  Neck: Normal range of motion.  Cardiovascular: Normal rate and regular rhythm.   Pulmonary/Chest: Effort normal and breath sounds normal. No respiratory distress.  Abdominal: Soft. Bowel sounds are normal. He exhibits no distension and no mass. There is no rebound and no guarding.       Soft, reducible hernia of right groin.  Ttp.  Abdomen otherwise non-tender.   Genitourinary:        Nml genitalia.   Musculoskeletal: Normal range of motion.       Sebaceous cyst center of upper back w/out erythema, induration or ttp.   Neurological: He is alert and oriented to person, place, and time.  Skin: Skin is warm and dry. No rash noted.  Psychiatric: He has a normal mood and affect. His behavior is normal.    ED Course  Procedures (including critical care time)  Labs Reviewed  URINALYSIS, ROUTINE W REFLEX MICROSCOPIC - Abnormal; Notable for the following:    Specific Gravity, Urine 1.035 (*)     Ketones, ur TRACE (*)     All other components within normal limits   No results found.   1. Inguinal hernia       MDM  51yo M presents w/ painful right-sided inguinal hernia.  Reducible on exam and abdomen otherwise benign.  Pt has been reassured that hernia is not strangulated/incarcerated but I advised close f/u and explained concerning symptoms to watch for. Received one vicodin for pain in ED and d/c'd home w/ same + referral to [facility].    Arie Sabina Ginnifer Creelman, PA-C 03/09/12 1650

## 2012-03-09 NOTE — ED Notes (Signed)
Pt states unable to void; cup given

## 2012-03-09 NOTE — ED Provider Notes (Signed)
Medical screening examination/treatment/procedure(s) were performed by non-physician practitioner and as supervising physician I was immediately available for consultation/collaboration.  John-Adam Ruffin Lada, M.D.     John-Adam Oneida Mckamey, MD 03/09/12 1818 

## 2012-03-20 ENCOUNTER — Encounter (INDEPENDENT_AMBULATORY_CARE_PROVIDER_SITE_OTHER): Payer: Self-pay | Admitting: General Surgery

## 2012-03-20 ENCOUNTER — Ambulatory Visit (INDEPENDENT_AMBULATORY_CARE_PROVIDER_SITE_OTHER): Payer: Self-pay | Admitting: Surgery

## 2012-03-20 ENCOUNTER — Encounter (INDEPENDENT_AMBULATORY_CARE_PROVIDER_SITE_OTHER): Payer: Self-pay | Admitting: Surgery

## 2012-03-20 VITALS — BP 152/90 | HR 84 | Temp 97.8°F | Resp 16 | Ht 70.0 in | Wt 171.4 lb

## 2012-03-20 DIAGNOSIS — K4090 Unilateral inguinal hernia, without obstruction or gangrene, not specified as recurrent: Secondary | ICD-10-CM

## 2012-03-20 MED ORDER — HYDROCODONE-ACETAMINOPHEN 5-325 MG PO TABS: 1.0000 | ORAL_TABLET | ORAL | Status: DC | PRN
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
## 2012-03-20 NOTE — Patient Instructions (Signed)
We will schedule surgery to repair the right inguinal hernia.

## 2012-03-25 ENCOUNTER — Encounter (HOSPITAL_COMMUNITY): Payer: Self-pay

## 2012-03-30 ENCOUNTER — Encounter (HOSPITAL_COMMUNITY)
Admission: RE | Admit: 2012-03-30 | Discharge: 2012-03-30 | Disposition: A | Discharge status: Home / Self Care | Payer: Self-pay | Source: Ambulatory Visit | Attending: Surgery | Admitting: Surgery

## 2012-03-30 ENCOUNTER — Encounter (HOSPITAL_COMMUNITY): Payer: Self-pay

## 2012-03-30 ENCOUNTER — Encounter (HOSPITAL_COMMUNITY)
Admission: RE | Admit: 2012-03-30 | Discharge: 2012-03-30 | Disposition: A | Discharge status: Home / Self Care | Payer: No Typology Code available for payment source | Source: Ambulatory Visit | Attending: Anesthesiology | Admitting: Anesthesiology

## 2012-03-30 HISTORY — DX: Essential (primary) hypertension: I10

## 2012-03-30 LAB — COMPREHENSIVE METABOLIC PANEL
ALT: 17 U/L (ref 0–53)
BUN: 16 mg/dL (ref 6–23)
CO2: 29 mEq/L (ref 19–32)
Calcium: 9.9 mg/dL (ref 8.4–10.5)
Creatinine, Ser: 0.93 mg/dL (ref 0.50–1.35)
GFR calc Af Amer: >90 mL/min (ref >90)
GFR calc non Af Amer: >90 mL/min (ref >90)
Glucose, Bld: 167 mg/dL — ABNORMAL HIGH (ref 70–99)

## 2012-03-30 LAB — CBC
Hemoglobin: 13.3 g/dL (ref 13.0–17.0)
MCHC: 32.6 g/dL (ref 30.0–36.0)
RBC: 4.37 MIL/uL (ref 4.22–5.81)

## 2012-03-30 NOTE — Progress Notes (Signed)
Spoke with PJ in Case Management regarding patient's living status and not being able to afford medications post discharge. Patient informed PAT RN that he would be unable to fill mupirocin if needed. I told him we could start medication DOS.

## 2012-03-30 NOTE — Pre-Procedure Instructions (Signed)
David Wyatt  03/30/2012   Your procedure is scheduled on:  January 22  Report to [facility] at 09:30 AM.  Call this number if you have problems the morning of surgery: [phone]   Remember:   Do not eat food or drink liquids after midnight.   Take these medicines the morning of surgery with A SIP OF WATER: Hydrocodone   STOP ibuprofen now  Do not wear jewelry, make-up or nail polish.  Do not wear lotions, powders, or perfumes. You may wear deodorant.  Do not shave 48 hours prior to surgery. Men may shave face and neck.  Do not bring valuables to the hospital.  Contacts, dentures or bridgework may not be worn into surgery.  Leave suitcase in the car. After surgery it may be brought to your room.  For patients admitted to the hospital, checkout time is 11:00 AM the day of discharge.   Patients discharged the day of surgery will not be allowed to drive home.  Name and phone number of your driver: Family/ Friend  Special Instructions: Shower using CHG 2 nights before surgery and the night before surgery.  If you shower the day of surgery use CHG.  Use special wash - you have one bottle of CHG for all showers.  You should use approximately 1/3 of the bottle for each shower.   Please read over the following fact sheets that you were given: Pain Booklet, Coughing and Deep Breathing and Surgical Site Infection Prevention

## 2012-03-31 MED ORDER — CEFAZOLIN SODIUM-DEXTROSE 2-3 GM-% IV SOLR
2.0000 g | INTRAVENOUS | Status: AC
  Administered 2012-04-01: 2 g via INTRAVENOUS
  Filled 2012-03-31: qty 50

## 2012-04-01 ENCOUNTER — Encounter (HOSPITAL_COMMUNITY): Payer: Self-pay | Admitting: Anesthesiology

## 2012-04-01 ENCOUNTER — Encounter (HOSPITAL_COMMUNITY): Payer: Self-pay | Admitting: *Deleted

## 2012-04-01 ENCOUNTER — Encounter (HOSPITAL_COMMUNITY)
Admission: RE | Disposition: A | Discharge status: Home / Self Care | Payer: Self-pay | Source: Ambulatory Visit | Attending: Surgery

## 2012-04-01 ENCOUNTER — Observation Stay (HOSPITAL_COMMUNITY)
Admission: RE | Admit: 2012-04-01 | Discharge: 2012-04-02 | Disposition: A | Discharge status: Home / Self Care | Payer: No Typology Code available for payment source | Source: Ambulatory Visit | Attending: Surgery | Admitting: Surgery

## 2012-04-01 ENCOUNTER — Ambulatory Visit (HOSPITAL_COMMUNITY): Payer: No Typology Code available for payment source | Admitting: Anesthesiology

## 2012-04-01 DIAGNOSIS — Z01810 Encounter for preprocedural cardiovascular examination: Secondary | ICD-10-CM | POA: Insufficient documentation

## 2012-04-01 DIAGNOSIS — E119 Type 2 diabetes mellitus without complications: Secondary | ICD-10-CM | POA: Insufficient documentation

## 2012-04-01 DIAGNOSIS — K4090 Unilateral inguinal hernia, without obstruction or gangrene, not specified as recurrent: Principal | ICD-10-CM | POA: Diagnosis present

## 2012-04-01 DIAGNOSIS — Z5900 Homelessness unspecified: Secondary | ICD-10-CM | POA: Insufficient documentation

## 2012-04-01 DIAGNOSIS — Z23 Encounter for immunization: Secondary | ICD-10-CM | POA: Insufficient documentation

## 2012-04-01 DIAGNOSIS — Z01818 Encounter for other preprocedural examination: Secondary | ICD-10-CM | POA: Insufficient documentation

## 2012-04-01 DIAGNOSIS — I10 Essential (primary) hypertension: Secondary | ICD-10-CM | POA: Insufficient documentation

## 2012-04-01 DIAGNOSIS — Z01812 Encounter for preprocedural laboratory examination: Secondary | ICD-10-CM | POA: Insufficient documentation

## 2012-04-01 HISTORY — PX: HERNIA REPAIR: SHX51

## 2012-04-01 HISTORY — PX: INGUINAL HERNIA REPAIR: SHX194

## 2012-04-01 LAB — CREATININE, SERUM
Creatinine, Ser: 0.92 mg/dL (ref 0.50–1.35)
GFR calc Af Amer: >90 mL/min (ref >90)
GFR calc non Af Amer: >90 mL/min (ref >90)

## 2012-04-01 LAB — CBC
Hemoglobin: 13.3 g/dL (ref 13.0–17.0)
MCH: 31 pg (ref 26.0–34.0)
Platelets: 357 10*3/uL (ref 150–400)
RBC: 4.29 MIL/uL (ref 4.22–5.81)
WBC: 6.2 10*3/uL (ref 4.0–10.5)

## 2012-04-01 LAB — GLUCOSE, CAPILLARY: Glucose-Capillary: 119 mg/dL — ABNORMAL HIGH (ref 70–99)

## 2012-04-01 SURGERY — REPAIR, HERNIA, INGUINAL, ADULT
Anesthesia: General | Site: Abdomen | Laterality: Right | Wound class: Clean

## 2012-04-01 MED ORDER — ARTIFICIAL TEARS OP OINT
TOPICAL_OINTMENT | OPHTHALMIC | Status: DC | PRN
  Administered 2012-04-01: 1 via OPHTHALMIC

## 2012-04-01 MED ORDER — KCL IN DEXTROSE-NACL 20-5-0.45 MEQ/L-%-% IV SOLN
INTRAVENOUS | Status: DC
  Administered 2012-04-01: 18:00:00 via INTRAVENOUS
  Filled 2012-04-01 (×3): qty 1000

## 2012-04-01 MED ORDER — PHENYLEPHRINE HCL 10 MG/ML IJ SOLN
INTRAMUSCULAR | Status: DC | PRN
  Administered 2012-04-01: 80 ug via INTRAVENOUS
  Administered 2012-04-01: 40 ug via INTRAVENOUS

## 2012-04-01 MED ORDER — EPHEDRINE SULFATE 50 MG/ML IJ SOLN
INTRAMUSCULAR | Status: DC | PRN
  Administered 2012-04-01: 10 mg via INTRAVENOUS
  Administered 2012-04-01 (×2): 5 mg via INTRAVENOUS

## 2012-04-01 MED ORDER — IBUPROFEN 400 MG PO TABS
400.0000 mg | ORAL_TABLET | Freq: Three times a day (TID) | ORAL | Status: DC | PRN
  Administered 2012-04-01 – 2012-04-02 (×2): 400 mg via ORAL
  Filled 2012-04-01 (×3): qty 1

## 2012-04-01 MED ORDER — BUPIVACAINE-EPINEPHRINE PF 0.5-1:200000 % IJ SOLN
INTRAMUSCULAR | Status: AC
  Filled 2012-04-01: qty 30

## 2012-04-01 MED ORDER — METFORMIN HCL 500 MG PO TABS
500.0000 mg | ORAL_TABLET | Freq: Two times a day (BID) | ORAL | Status: DC
  Administered 2012-04-01 – 2012-04-02 (×2): 500 mg via ORAL
  Filled 2012-04-01 (×4): qty 1

## 2012-04-01 MED ORDER — BUPIVACAINE-EPINEPHRINE 0.5% -1:200000 IJ SOLN
INTRAMUSCULAR | Status: DC | PRN
  Administered 2012-04-01: 20 mL

## 2012-04-01 MED ORDER — CHLORHEXIDINE GLUCONATE 4 % EX LIQD: 1.0000 "application " | Freq: Once | CUTANEOUS | Status: DC

## 2012-04-01 MED ORDER — LACTATED RINGERS IV SOLN
INTRAVENOUS | Status: DC
  Administered 2012-04-01: 11:00:00 via INTRAVENOUS

## 2012-04-01 MED ORDER — LISINOPRIL 20 MG PO TABS
20.0000 mg | ORAL_TABLET | Freq: Every day | ORAL | Status: DC
  Administered 2012-04-01 – 2012-04-02 (×2): 20 mg via ORAL
  Filled 2012-04-01 (×2): qty 1

## 2012-04-01 MED ORDER — HYDROCODONE-ACETAMINOPHEN 5-325 MG PO TABS
1.0000 | ORAL_TABLET | ORAL | Status: DC | PRN
  Administered 2012-04-01 – 2012-04-02 (×5): 1 via ORAL
  Filled 2012-04-01 (×5): qty 1

## 2012-04-01 MED ORDER — HYDROMORPHONE HCL PF 1 MG/ML IJ SOLN: 0.2500 mg | INTRAMUSCULAR | Status: DC | PRN

## 2012-04-01 MED ORDER — FENTANYL CITRATE 0.05 MG/ML IJ SOLN
INTRAMUSCULAR | Status: DC | PRN
  Administered 2012-04-01: 50 ug via INTRAVENOUS

## 2012-04-01 MED ORDER — LACTATED RINGERS IV SOLN
INTRAVENOUS | Status: DC | PRN
  Administered 2012-04-01 (×2): via INTRAVENOUS

## 2012-04-01 MED ORDER — HEPARIN SODIUM (PORCINE) 5000 UNIT/ML IJ SOLN
5000.0000 [IU] | Freq: Three times a day (TID) | INTRAMUSCULAR | Status: DC
Start: 2012-04-02 — End: 2012-04-02
  Administered 2012-04-02: 5000 [IU] via SUBCUTANEOUS
  Filled 2012-04-01 (×4): qty 1

## 2012-04-01 MED ORDER — MIDAZOLAM HCL 5 MG/5ML IJ SOLN
INTRAMUSCULAR | Status: DC | PRN
  Administered 2012-04-01: 2 mg via INTRAVENOUS

## 2012-04-01 MED ORDER — PNEUMOCOCCAL VAC POLYVALENT 25 MCG/0.5ML IJ INJ
0.5000 mL | INJECTION | INTRAMUSCULAR | Status: AC
  Administered 2012-04-02: 0.5 mL via INTRAMUSCULAR
  Filled 2012-04-01: qty 0.5

## 2012-04-01 MED ORDER — 0.9 % SODIUM CHLORIDE (POUR BTL) OPTIME
TOPICAL | Status: DC | PRN
  Administered 2012-04-01: 1000 mL

## 2012-04-01 MED ORDER — PROPOFOL 10 MG/ML IV BOLUS
INTRAVENOUS | Status: DC | PRN
  Administered 2012-04-01: 250 mg via INTRAVENOUS

## 2012-04-01 MED ORDER — PROMETHAZINE HCL 25 MG PO TABS
25.0000 mg | ORAL_TABLET | Freq: Four times a day (QID) | ORAL | Status: DC | PRN
  Administered 2012-04-01: 25 mg via ORAL
  Filled 2012-04-01: qty 1

## 2012-04-01 SURGICAL SUPPLY — 55 items
ADH SKN CLS APL DERMABOND .7 (GAUZE/BANDAGES/DRESSINGS) ×1
BLADE SURG 10 STRL SS (BLADE) ×2 IMPLANT
BLADE SURG 15 STRL LF DISP TIS (BLADE) ×1 IMPLANT
BLADE SURG 15 STRL SS (BLADE) ×2
BLADE SURG ROTATE 9660 (MISCELLANEOUS) IMPLANT
CANISTER SUCTION 2500CC (MISCELLANEOUS) IMPLANT
CHLORAPREP W/TINT 26ML (MISCELLANEOUS) ×2 IMPLANT
CLOTH BEACON ORANGE TIMEOUT ST (SAFETY) ×2 IMPLANT
COVER SURGICAL LIGHT HANDLE (MISCELLANEOUS) ×2 IMPLANT
DECANTER SPIKE VIAL GLASS SM (MISCELLANEOUS) IMPLANT
DERMABOND ADVANCED (GAUZE/BANDAGES/DRESSINGS) ×1
DERMABOND ADVANCED .7 DNX12 (GAUZE/BANDAGES/DRESSINGS) ×1 IMPLANT
DRAIN PENROSE 1/2X12 LTX STRL (WOUND CARE) IMPLANT
DRAPE LAPAROTOMY TRNSV 102X78 (DRAPE) ×2 IMPLANT
DRAPE UTILITY 15X26 W/TAPE STR (DRAPE) ×4 IMPLANT
ELECT CAUTERY BLADE 6.4 (BLADE) ×2 IMPLANT
ELECT REM PT RETURN 9FT ADLT (ELECTROSURGICAL) ×2
ELECTRODE REM PT RTRN 9FT ADLT (ELECTROSURGICAL) ×1 IMPLANT
GLOVE BIOGEL PI IND STRL 6.5 (GLOVE) IMPLANT
GLOVE BIOGEL PI IND STRL 7.0 (GLOVE) IMPLANT
GLOVE BIOGEL PI INDICATOR 6.5 (GLOVE) ×1
GLOVE BIOGEL PI INDICATOR 7.0 (GLOVE) ×1
GLOVE ECLIPSE 6.5 STRL STRAW (GLOVE) ×1 IMPLANT
GLOVE EUDERMIC 7 POWDERFREE (GLOVE) ×2 IMPLANT
GOWN PREVENTION PLUS XLARGE (GOWN DISPOSABLE) ×2 IMPLANT
GOWN STRL NON-REIN LRG LVL3 (GOWN DISPOSABLE) ×3 IMPLANT
KIT BASIN OR (CUSTOM PROCEDURE TRAY) ×2 IMPLANT
KIT ROOM TURNOVER OR (KITS) ×2 IMPLANT
MESH ULTRAPRO 3X6 7.6X15CM (Mesh General) ×1 IMPLANT
NDL HYPO 25GX1X1/2 BEV (NEEDLE) ×1 IMPLANT
NEEDLE 22X1 1/2 (OR ONLY) (NEEDLE) ×2 IMPLANT
NEEDLE HYPO 25GX1X1/2 BEV (NEEDLE) ×2 IMPLANT
NS IRRIG 1000ML POUR BTL (IV SOLUTION) ×2 IMPLANT
PACK SURGICAL SETUP 50X90 (CUSTOM PROCEDURE TRAY) ×2 IMPLANT
PAD ARMBOARD 7.5X6 YLW CONV (MISCELLANEOUS) ×4 IMPLANT
PENCIL BUTTON HOLSTER BLD 10FT (ELECTRODE) ×2 IMPLANT
SPECIMEN JAR SMALL (MISCELLANEOUS) IMPLANT
SPONGE INTESTINAL PEANUT (DISPOSABLE) ×2 IMPLANT
SPONGE LAP 4X18 X RAY DECT (DISPOSABLE) ×2 IMPLANT
SUT MNCRL AB 4-0 PS2 18 (SUTURE) ×2 IMPLANT
SUT PROLENE 2 0 CT2 30 (SUTURE) ×4 IMPLANT
SUT SILK 2 0 (SUTURE)
SUT SILK 2 0 SH (SUTURE) ×1 IMPLANT
SUT SILK 2-0 18XBRD TIE 12 (SUTURE) IMPLANT
SUT VIC AB 3-0 54X BRD REEL (SUTURE) ×1 IMPLANT
SUT VIC AB 3-0 BRD 54 (SUTURE) ×2
SUT VIC AB 3-0 CT1 27 (SUTURE) ×2
SUT VIC AB 3-0 CT1 TAPERPNT 27 (SUTURE) ×1 IMPLANT
SYR BULB 3OZ (MISCELLANEOUS) ×2 IMPLANT
SYR CONTROL 10ML LL (SYRINGE) ×2 IMPLANT
TOWEL OR 17X24 6PK STRL BLUE (TOWEL DISPOSABLE) ×2 IMPLANT
TOWEL OR 17X26 10 PK STRL BLUE (TOWEL DISPOSABLE) ×2 IMPLANT
TUBE CONNECTING 12X1/4 (SUCTIONS) IMPLANT
WATER STERILE IRR 1000ML POUR (IV SOLUTION) IMPLANT
YANKAUER SUCT BULB TIP NO VENT (SUCTIONS) IMPLANT

## 2012-04-01 NOTE — Interval H&P Note (Signed)
History and Physical Interval Note:  04/01/2012 11:30 AM  David Wyatt  has presented today for surgery, with the diagnosis of right inguinal hernia  The various methods of treatment have been discussed with the patient and family. After consideration of risks, benefits and other options for treatment, the patient has consented to  Procedure(s) (LRB) with comments: HERNIA REPAIR INGUINAL ADULT (Right) as a surgical intervention .  The patient's history has been reviewed, patient examined, no change in status, stable for surgery.  I have reviewed the patient's chart and labs.  Questions were answered to the patient's satisfaction.    The right inguinal area is marked as the operative sideSTRECK,Esteen Delpriore J

## 2012-04-01 NOTE — Progress Notes (Signed)
UR completed 

## 2012-04-01 NOTE — Transfer of Care (Signed)
Immediate Anesthesia Transfer of Care Note  Patient: David Wyatt  Procedure(s) Performed: Procedure(s) (LRB) with comments: HERNIA REPAIR INGUINAL ADULT (Right)  Patient Location: PACU  Anesthesia Type:General  Level of Consciousness: awake  Airway & Oxygen Therapy: Patient Spontanous Breathing and Patient connected to nasal cannula oxygen  Post-op Assessment: Report given to PACU RN and Post -op Vital signs reviewed and stable  Post vital signs: Reviewed and stable  Complications: No apparent anesthesia complications

## 2012-04-01 NOTE — Op Note (Signed)
David Wyatt 01/21/1961 [id number] 03/20/2012  Preoperative diagnosis: Right inguinal hernia, probably indirect  Postoperative diagnosis: Indirect right inguina lhernia  Procedure: Repair of with mesh  Surgeon: Currie Paris, MD, FACS  Anesthesia:General  Clinical History and Indications: The patient has a reducible but symptomatic right inguinal hernial  Description of Procedure:The patient was seen in the holding area and the plans for the procedure as noted above confirmed with the patient. We reviewed again the risks and complications and the patient had no further questions. I then marked the right  as the operative side. This was confirmed with the patient. He wishes to proceed.  The patient was taken to the operating room and after satisfactory general anesthesia was obtained the right  inguinal area was prepped and draped as a sterile field. A time out was done.  I used a of 0.5% plain Marcaine for local anesthesia and to help with postoperative pain management. The area of the incision was infiltrated first as well as a field block going medially and inferiorly. I also injected some below the external oblique aponeurosis at the level of the anterior superior iliac spine.  An oblique incision was made and deepened to the external oblique aponeurosis. Bleeders were either cauterized or tied with 3-0 Vicryl. The external oblique aponeurosis was opened in the line of its fibers and elevated off of the underlying tissue. The ilioinguinal nerve was noted and protected.  The spermatic cord was then dissected up off of the inguinal floor and surrounded with a drain. There was an indirect sac identified, no direct defect. The sac was stripped off the cord and suture ligated at the base with a 2-0 silk  I then took some Ultrapromesh and cut it to shape. It was anchored at the pubic tubercle and a running 2-0 Prolene used to suture it to the edge of the inferior shelving edge of the  external oblique. It was split laterally to go around the cord and then laid gently over the internal oblique medially. Several more sutures of 2-0 Prolene were used to anchor the mesh to the internal oblique. The tails of the mesh were crossed lateral to the cord structures and deep ring and tacked together.  This appeared to produce a nice coverage and repair with no tension. There was adequate space for the cord structures to exit through the mesh and deep ring.  I checked to make sure everything was dry. Additional local had been infiltrated as I was working to be sure we had complete anesthesia of the entire operative field.  The incision was then closed with a running 3-0 Vicryl on the external oblique, closing it over the repair. Scarpa's fascia was closed with a running 3-0 Vicryl and the skin with a running 4-0 Monocryl subcuticular and Dermabond on the skin.  The patient tolerated the procedure well. There were no operative complications. There was minimal blood loss. All counts were correct.  Currie Paris, MD, FACS 04/01/2012 1:07 PM

## 2012-04-01 NOTE — Anesthesia Postprocedure Evaluation (Signed)
  Anesthesia Post-op Note  Patient: David Wyatt  Procedure(s) Performed: Procedure(s) (LRB) with comments: HERNIA REPAIR INGUINAL ADULT (Right)  Patient Location: PACU  Anesthesia Type:General  Level of Consciousness: awake  Airway and Oxygen Therapy: Patient Spontanous Breathing  Post-op Pain: mild  Post-op Assessment: Post-op Vital signs reviewed  Post-op Vital Signs: Reviewed  Complications: No apparent anesthesia complications

## 2012-04-01 NOTE — Anesthesia Preprocedure Evaluation (Addendum)
Anesthesia Evaluation  Patient identified by MRN, date of birth, ID band Patient awake    Reviewed: Allergy & Precautions, H&P , NPO status   Airway Mallampati: II TM Distance: >3 FB Neck ROM: Full    Dental  (+) Edentulous Upper, Poor Dentition and Missing   Pulmonary  breath sounds clear to auscultation        Cardiovascular hypertension, Pt. on medications Rhythm:Regular Rate:Normal     Neuro/Psych Pt admits to crack- cocaine use.   GI/Hepatic negative GI ROS, Neg liver ROS,   Endo/Other  diabetes, Poorly Controlled, Type 2, Oral Hypoglycemic Agents  Renal/GU      Musculoskeletal   Abdominal   Peds  Hematology   Anesthesia Other Findings   Reproductive/Obstetrics                          Anesthesia Physical Anesthesia Plan  ASA: III  Anesthesia Plan: General   Post-op Pain Management:    Induction:   Airway Management Planned: LMA  Additional Equipment:   Intra-op Plan:   Post-operative Plan: Extubation in OR  Informed Consent: I have reviewed the patients History and Physical, chart, labs and discussed the procedure including the risks, benefits and alternatives for the proposed anesthesia with the patient or authorized representative who has indicated his/her understanding and acceptance.   Dental advisory given  Plan Discussed with: CRNA, Anesthesiologist and Surgeon  Anesthesia Plan Comments:         Anesthesia Quick Evaluation
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
## 2012-04-02 MED ORDER — OXYCODONE-ACETAMINOPHEN 5-325 MG PO TABS: 1.0000 | ORAL_TABLET | ORAL | Status: DC | PRN

## 2012-04-02 NOTE — Clinical Social Work Psychosocial (Signed)
     Clinical Social Work Department BRIEF PSYCHOSOCIAL ASSESSMENT 04/02/2012  Patient:  David Wyatt, David Wyatt     Account Number:  [id number]     Admit date:  04/01/2012  Clinical Social Worker:  Hulan Fray  Date/Time:  04/02/2012 10:20 AM  Referred by:  Physician  Date Referred:  04/01/2012 Referred for  Other - See comment   Other Referral:   Return back to Shelter   Interview type:  Patient Other interview type:    PSYCHOSOCIAL DATA Living Status:  FACILITY Admitted from facility:  [facility] Level of care:  Group Home Primary support name:  Kealan Buchan Primary support relationship to patient:  PARENT Degree of support available:   adequate    CURRENT CONCERNS Current Concerns  Post-Acute Placement   Other Concerns:    SOCIAL WORK ASSESSMENT / PLAN Clinical Social Worker received referral for patient being homeless and currently staying in a shelter. CSW introduced self and explained reason for visit. Patient reported that he is residing in [place] and will need bus pass for transportation.    CSW spoke with Cordelia Pen at [facility] and if patient requires bedrest, they will need documentation stating that request. If patient needs dressing changes, they will need a nurse to come by to assist with the dressing changes, because that is something that they do not assist with. CSW notified CM regarding need for home health RN, if appropriate and notified RN regarding documentation needed for bedrest if appropriate. CSW will sign off, as social work intervention is no longer needed.   Assessment/plan status:  No Further Intervention Required Other assessment/ plan:   Information/referral to community resources:   Patient lives in shelter, [facility]    PATIENTS/FAMILYS RESPONSE TO PLAN OF CARE: Patient reported that he plans to return back to [facility] and asked for note stating bedrest he can take back to the shelter.

## 2012-04-02 NOTE — Progress Notes (Signed)
1 Day Post-Op   Assessment: s/p Procedure(s): HERNIA REPAIR INGUINAL ADULT Patient Active Problem List  Diagnosis  . Right inguinal hernia    Doing well post op  Plan: Discharge He states that he has never taken any pills for DM, although they were listed on his "home meds."  Subjective: Feels OK. Having some burning pain at the incision  Objective: Vital signs in last 24 hours: Temp:  [97 F (36.1 C)-98.6 F (37 C)] 98.2 F (36.8 C) (01/23 0130) Pulse Rate:  [60-89] 62  (01/23 0130) Resp:  [12-19] 18  (01/23 0130) BP: (127-152)/(73-95) 127/74 mmHg (01/23 0130) SpO2:  [88 %-100 %] 100 % (01/23 0130) Weight:  [174 lb 11.2 oz (79.243 kg)] 174 lb 11.2 oz (79.243 kg) (01/22 1430)   Intake/Output from previous day: 01/22 0701 - 01/23 0700 In: 2305 [P.O.:480; I.V.:1825] Out: 501 [Urine:501] Intake/Output this shift: Total I/O In: 360 [P.O.:360] Out: 450 [Urine:450]   General appearance: alert, cooperative and no distress Resp: clear to auscultation bilaterally GI: soft, non-tender; bowel sounds normal; no masses,  no organomegaly  Incision: healing well  Lab Results:   Basename 04/01/12 1516 03/30/12 1025  WBC 6.2 6.1  HGB 13.3 13.3  HCT 39.9 40.8  PLT 357 366   BMET  Basename 04/01/12 1516 03/30/12 1025  NA -- 141  K -- 4.2  CL -- 104  CO2 -- 29  GLUCOSE -- 167*  BUN -- 16  CREATININE 0.92 0.93  CALCIUM -- 9.9   PT/INR No results found for this basename: LABPROT:2,INR:2 in the last 72 hours ABG No results found for this basename: PHART:2,PCO2:2,PO2:2,HCO3:2 in the last 72 hours  MEDS, Scheduled    . heparin  5,000 Units Subcutaneous Q8H  . lisinopril  20 mg Oral Daily  . metFORMIN  500 mg Oral BID WC  . pneumococcal 23 valent vaccine  0.5 mL Intramuscular Tomorrow-1000    Studies/Results: No results found.    LOS: 1 day     Currie Paris, MD, [facility], [place] [phone]   04/02/2012 9:50 AM

## 2012-04-03 ENCOUNTER — Encounter (HOSPITAL_COMMUNITY): Payer: Self-pay | Admitting: Surgery

## 2012-04-06 NOTE — Discharge Summary (Signed)
Patient ID: David Wyatt [id number] 51 y.o. 05/08/1960  Admission  Date: 04/01/2012  Discharge date and time: 04/02/2012  3:15 PM  Admitting Physician: Currie Paris  Discharge Physician: Currie Paris  Admission Diagnoses: right inguinal hernia  Discharge Diagnoses: Right inguinalhernia  Operations: Procedure(s): HERNIA REPAIR INGUINAL ADULT  Discharged Condition: good  Hospital Course: The patient had an overnight stay after surgery as he is homeless and we needed to be sure no immediate complications occurred. He did well and was able to be discharged the day after surgery  Consults: None  Significant Diagnostic Studies: None   Disposition: Homeless shelter

## 2012-04-24 ENCOUNTER — Encounter (INDEPENDENT_AMBULATORY_CARE_PROVIDER_SITE_OTHER): Payer: Self-pay | Admitting: Surgery

## 2012-04-29 ENCOUNTER — Ambulatory Visit (INDEPENDENT_AMBULATORY_CARE_PROVIDER_SITE_OTHER): Payer: PRIVATE HEALTH INSURANCE | Admitting: Surgery

## 2012-04-29 ENCOUNTER — Encounter (INDEPENDENT_AMBULATORY_CARE_PROVIDER_SITE_OTHER): Payer: Self-pay | Admitting: Surgery

## 2012-04-29 VITALS — BP 123/81 | HR 83 | Temp 97.7°F | Resp 16 | Ht 70.0 in | Wt 169.6 lb

## 2012-04-29 DIAGNOSIS — Z09 Encounter for follow-up examination after completed treatment for conditions other than malignant neoplasm: Secondary | ICD-10-CM

## 2012-04-29 MED ORDER — HYDROCODONE-ACETAMINOPHEN 5-325 MG PO TABS: 1.0000 | ORAL_TABLET | ORAL | Status: DC | PRN

## 2012-04-29 NOTE — Progress Notes (Signed)
NAME: David Wyatt                                            DOB: 07-Jul-1960 DATE: 04/29/2012                                                  MRN: [id number]  CC:  Chief Complaint  Patient presents with  . Routine Post Op    RIH repair on n [id number]    HPI: This patient comes in for post op follow-up .Heunderwent RIH repair on [id number]. He feels that he is doing well.Stillhaving some incisional pain -   PE:  VITAL SIGNS: BP 123/81  Pulse 83  Temp(Src) 97.7 F (36.5 C) (Temporal)  Resp 16  Ht 5\' 10"  (1.778 m)  Wt 169 lb 9.6 oz (76.93 kg)  BMI 24.34 kg/m2  General: The patient appears to be healthy, NAD Incision: Healing well repair solid  DATA REVIEWED: No new  IMPRESSION: The patient is doing well S/P RIH repair.    PLAN: RTC PRN Gave him Rx of 20 vicodin

## 2012-04-29 NOTE — Patient Instructions (Signed)
We will see you again on an as needed basis. Please call the office at [phone] if you have any questions or concerns. Thank you for allowing us to take care of you.  

## 2012-07-18 ENCOUNTER — Emergency Department (HOSPITAL_COMMUNITY)
Admission: EM | Admit: 2012-07-18 | Discharge: 2012-07-18 | Disposition: A | Discharge status: Home / Self Care | Payer: No Typology Code available for payment source | Attending: Emergency Medicine | Admitting: Emergency Medicine

## 2012-07-18 ENCOUNTER — Encounter (HOSPITAL_COMMUNITY): Payer: Self-pay | Admitting: Physical Medicine and Rehabilitation

## 2012-07-18 DIAGNOSIS — IMO0002 Reserved for concepts with insufficient information to code with codable children: Secondary | ICD-10-CM | POA: Insufficient documentation

## 2012-07-18 DIAGNOSIS — I10 Essential (primary) hypertension: Secondary | ICD-10-CM | POA: Insufficient documentation

## 2012-07-18 DIAGNOSIS — L0390 Cellulitis, unspecified: Secondary | ICD-10-CM

## 2012-07-18 DIAGNOSIS — Z79899 Other long term (current) drug therapy: Secondary | ICD-10-CM | POA: Insufficient documentation

## 2012-07-18 DIAGNOSIS — F17200 Nicotine dependence, unspecified, uncomplicated: Secondary | ICD-10-CM | POA: Insufficient documentation

## 2012-07-18 DIAGNOSIS — E119 Type 2 diabetes mellitus without complications: Secondary | ICD-10-CM | POA: Insufficient documentation

## 2012-07-18 MED ORDER — IBUPROFEN 400 MG PO TABS
800.0000 mg | ORAL_TABLET | Freq: Once | ORAL | Status: AC
  Administered 2012-07-18: 800 mg via ORAL
  Filled 2012-07-18: qty 2

## 2012-07-18 MED ORDER — SULFAMETHOXAZOLE-TRIMETHOPRIM 800-160 MG PO TABS: 1.0000 | ORAL_TABLET | Freq: Two times a day (BID) | ORAL | Status: DC

## 2012-07-18 NOTE — ED Notes (Signed)
Meal given

## 2012-07-18 NOTE — ED Provider Notes (Signed)
History    This chart was scribed for Glade Nurse (PA) non-physician practitioner working with David Cooper III, MD by [name], ED Scribe. This patient was seen in room TR11C/TR11C and the patient's care was started at 6:57PM.    CSN: [id number]   Arrival date & time 07/18/12  1650   First MD Initiated Contact with Patient 07/18/12 1857      Chief Complaint  Patient presents with  . Abscess    (Consider location/radiation/quality/duration/timing/severity/associated sxs/prior treatment) Patient is a 52 y.o. male presenting with abscess. The history is provided by the patient. No language interpreter was used.  Abscess Abscess location: posterior left forearm proxmial to the radial notch. Abscess quality: painful, redness and warmth   Abscess quality: not draining, no fluctuance and no induration   Red streaking: no   Duration:  4 days Progression:  Worsening Pain details:    Quality: stinging.   Severity:  Moderate   Duration:  4 days   Timing:  Constant   Progression:  Worsening Chronicity:  New Context: diabetes and insect bite/sting (Possible spider bite)   Relieved by:  Nothing Worsened by:  Nothing tried Ineffective treatments:  None tried Associated symptoms: no fever, no headaches, no nausea and no vomiting     David Wyatt is a 52 y.o. male , with a hx of diabetes mellitus, hypertension, and inguinal hernia repair (Performed on 04/01/12, by Dr. Jamey Ripa, Surgeon, at [facility] OR) , who presents to the Emergency Department complaining of gradual, progressively worsening, non-fluctuant mass. Pt states he thinks it is a spider bite though he cannot be sure. Says he experienced similar symptoms on the right hand not too long ago which was treated with an unknown antibiotic and resolved. Today's mass is located at the posterior left forearm proxmial to the radial notch, onset four days ago (07/14/12). Associated symptoms include pain, redness, warmth, swelling at the site. Pt reports  the area is now scabbed over as he scratched it until it bled.  The pt is a current everyday smoker, however, he does not drink alcohol.      Past Medical History  Diagnosis Date  . Diabetes mellitus   . Hypertension     Past Surgical History  Procedure Laterality Date  . Inguinal hernia repair  04/01/2012  . Inguinal hernia repair  04/01/2012    Procedure: HERNIA REPAIR INGUINAL ADULT;  Surgeon: Currie Paris, MD;  Location: [facility] OR;  Service: General;  Laterality: Right;    Family History  Problem Relation Age of Onset  . Diabetes Father     History  Substance Use Topics  . Smoking status: Current Every Day Smoker -- 0.25 packs/day for 30 years    Types: Cigarettes  . Smokeless tobacco: Never Used  . Alcohol Use: No      Review of Systems  Constitutional: Negative for fever and diaphoresis.  HENT: Negative for neck pain and neck stiffness.   Eyes: Negative for visual disturbance.  Respiratory: Negative for apnea, chest tightness and shortness of breath.   Cardiovascular: Negative for chest pain and palpitations.  Gastrointestinal: Negative for nausea, vomiting, diarrhea and constipation.  Genitourinary: Negative for dysuria.  Musculoskeletal: Negative for gait problem.  Skin: Positive for wound. Negative for rash.  Neurological: Negative for dizziness, weakness, light-headedness, numbness and headaches.    Allergies  Review of patient's allergies indicates no known allergies.  Home Medications   Current Outpatient Rx  Name  Route  Sig  Dispense  Refill  .  lisinopril (PRINIVIL,ZESTRIL) 20 MG tablet   Oral   Take 20 mg by mouth daily.         . metFORMIN (GLUCOPHAGE) 500 MG tablet   Oral   Take 500 mg by mouth 2 (two) times daily with a meal.            BP 153/98  Pulse 96  Temp(Src) 98.8 F (37.1 C) (Oral)  Resp 20  SpO2 97%  Physical Exam  Nursing note and vitals reviewed. Constitutional: He is oriented to person, place, and time. He  appears well-developed and well-nourished. No distress.  HENT:  Head: Normocephalic and atraumatic.  Eyes: Conjunctivae and EOM are normal.  Neck: Normal range of motion. Neck supple.  No meningeal signs  Cardiovascular: Normal rate, regular rhythm and normal heart sounds.  Exam reveals no gallop and no friction rub.   No murmur heard. Pulmonary/Chest: Effort normal and breath sounds normal. No respiratory distress. He has no wheezes. He has no rales. He exhibits no tenderness.  Abdominal: Soft. Bowel sounds are normal. He exhibits no distension. There is no tenderness. There is no rebound and no guarding.  Musculoskeletal: Normal range of motion. He exhibits no edema and no tenderness.  Neurological: He is alert and oriented to person, place, and time. No cranial nerve deficit.  Skin: Skin is warm and dry. He is not diaphoretic. No erythema.     2 cm tender, non fluctuant mass on posterior left forearm proxmial to the radial notch. Erythema, swelling, scabbing, warmth, tenderness. No pustule discharge noted.   Psychiatric: He has a normal mood and affect.    ED Course  Procedures (including critical care time)  DIAGNOSTIC STUDIES: Oxygen Saturation is 97% on room air, normal by my interpretation.    COORDINATION OF CARE:  7:29 PM- Treatment plan discussed with patient. Pt agrees with treatment.      Labs Reviewed - No data to display No results found.   1. Cellulitis       MDM  Patient with 2 cm tender, non fluctuant mass on posterior left forearm proxmial to the radial notch. Erythema, swelling, scabbing, warmth, tenderness. No pustule discharge noted. Not amenable to incision and drainage. Directed pt to use warm compresses at home. Outlined border with skin marker. Discussed return precautions with pt. Prescribed Bactrim. Pt understands diagnosis and is in agreement with discharge plan.    I personally performed the services described in this documentation, which was  scribed in my presence. The recorded information has been reviewed and is accurate.    Glade Nurse, PA-C 07/19/12 [phone]

## 2012-07-18 NOTE — ED Notes (Signed)
Pt presents to department for evaluation of abscess to L forearm. Ongoing x4 days. States he thinks this is a spider bite. Yellow discharge noted. 10/10 pain.

## 2012-07-18 NOTE — ED Notes (Signed)
The pt requesting something to eat.  Very poor hygeine

## 2012-07-20 NOTE — ED Provider Notes (Signed)
Medical screening examination/treatment/procedure(s) were performed by non-physician practitioner and as supervising physician I was immediately available for consultation/collaboration.   Carleene Cooper III, MD 07/20/12 [phone]

## 2012-07-28 ENCOUNTER — Inpatient Hospital Stay (HOSPITAL_COMMUNITY)
Admission: EM | Admit: 2012-07-28 | Discharge: 2012-07-30 | DRG: 639 | Disposition: A | Discharge status: Home / Self Care | Payer: No Typology Code available for payment source | Attending: Internal Medicine | Admitting: Internal Medicine

## 2012-07-28 ENCOUNTER — Encounter (HOSPITAL_COMMUNITY): Payer: Self-pay | Admitting: Emergency Medicine

## 2012-07-28 DIAGNOSIS — M62838 Other muscle spasm: Secondary | ICD-10-CM | POA: Diagnosis present

## 2012-07-28 DIAGNOSIS — E1165 Type 2 diabetes mellitus with hyperglycemia: Secondary | ICD-10-CM

## 2012-07-28 DIAGNOSIS — M19049 Primary osteoarthritis, unspecified hand: Secondary | ICD-10-CM | POA: Diagnosis present

## 2012-07-28 DIAGNOSIS — R531 Weakness: Secondary | ICD-10-CM | POA: Diagnosis present

## 2012-07-28 DIAGNOSIS — F1490 Cocaine use, unspecified, uncomplicated: Secondary | ICD-10-CM

## 2012-07-28 DIAGNOSIS — T46905A Adverse effect of unspecified agents primarily affecting the cardiovascular system, initial encounter: Secondary | ICD-10-CM | POA: Diagnosis present

## 2012-07-28 DIAGNOSIS — T783XXD Angioneurotic edema, subsequent encounter: Secondary | ICD-10-CM

## 2012-07-28 DIAGNOSIS — E1100 Type 2 diabetes mellitus with hyperosmolarity without nonketotic hyperglycemic-hyperosmolar coma (NKHHC): Principal | ICD-10-CM | POA: Diagnosis present

## 2012-07-28 DIAGNOSIS — F1410 Cocaine abuse, uncomplicated: Secondary | ICD-10-CM | POA: Diagnosis present

## 2012-07-28 DIAGNOSIS — Z9119 Patient's noncompliance with other medical treatment and regimen: Secondary | ICD-10-CM

## 2012-07-28 DIAGNOSIS — Z91199 Patient's noncompliance with other medical treatment and regimen due to unspecified reason: Secondary | ICD-10-CM

## 2012-07-28 DIAGNOSIS — Z79899 Other long term (current) drug therapy: Secondary | ICD-10-CM

## 2012-07-28 DIAGNOSIS — I10 Essential (primary) hypertension: Secondary | ICD-10-CM

## 2012-07-28 DIAGNOSIS — R739 Hyperglycemia, unspecified: Secondary | ICD-10-CM | POA: Diagnosis present

## 2012-07-28 DIAGNOSIS — IMO0002 Reserved for concepts with insufficient information to code with codable children: Secondary | ICD-10-CM | POA: Diagnosis present

## 2012-07-28 DIAGNOSIS — M1990 Unspecified osteoarthritis, unspecified site: Secondary | ICD-10-CM | POA: Diagnosis present

## 2012-07-28 DIAGNOSIS — T783XXA Angioneurotic edema, initial encounter: Secondary | ICD-10-CM

## 2012-07-28 DIAGNOSIS — F17200 Nicotine dependence, unspecified, uncomplicated: Secondary | ICD-10-CM | POA: Diagnosis present

## 2012-07-28 HISTORY — DX: Unspecified osteoarthritis, unspecified site: M19.90

## 2012-07-28 HISTORY — DX: Cocaine abuse, uncomplicated: F14.10

## 2012-07-28 HISTORY — DX: Angioneurotic edema, initial encounter: T78.3XXA

## 2012-07-28 HISTORY — DX: Type 2 diabetes mellitus without complications: E11.9

## 2012-07-28 LAB — CBC WITH DIFFERENTIAL/PLATELET
Basophils Absolute: 0 10*3/uL (ref 0.0–0.1)
Eosinophils Relative: 0 % (ref 0–5)
HCT: 39.5 % (ref 39.0–52.0)
Lymphocytes Relative: 15 % (ref 12–46)
Lymphs Abs: 1.1 10*3/uL (ref 0.7–4.0)
MCV: 90.4 fL (ref 78.0–100.0)
Monocytes Relative: 6 % (ref 3–12)
Neutro Abs: 5.8 10*3/uL (ref 1.7–7.7)
RBC: 4.37 MIL/uL (ref 4.22–5.81)
RDW: 14.6 % (ref 11.5–15.5)
WBC: 7.3 10*3/uL (ref 4.0–10.5)

## 2012-07-28 LAB — URINALYSIS, ROUTINE W REFLEX MICROSCOPIC
Bilirubin Urine: NEGATIVE
Glucose, UA: >1000 mg/dL — AB
Hgb urine dipstick: NEGATIVE
Ketones, ur: NEGATIVE mg/dL
Protein, ur: NEGATIVE mg/dL
Urobilinogen, UA: 0.2 mg/dL (ref 0.0–1.0)

## 2012-07-28 LAB — COMPREHENSIVE METABOLIC PANEL
AST: 102 U/L — ABNORMAL HIGH (ref 0–37)
Albumin: 2.7 g/dL — ABNORMAL LOW (ref 3.5–5.2)
Alkaline Phosphatase: 507 U/L — ABNORMAL HIGH (ref 39–117)
CO2: 19 mEq/L (ref 19–32)
Chloride: 89 mEq/L — ABNORMAL LOW (ref 96–112)
Creatinine, Ser: 0.8 mg/dL (ref 0.50–1.35)
GFR calc non Af Amer: >90 mL/min (ref >90)
Potassium: 5 mEq/L (ref 3.5–5.1)
Total Bilirubin: 1.4 mg/dL — ABNORMAL HIGH (ref 0.3–1.2)

## 2012-07-28 LAB — GLUCOSE, CAPILLARY

## 2012-07-28 LAB — URINE MICROSCOPIC-ADD ON

## 2012-07-28 MED ORDER — SODIUM CHLORIDE 0.9 % IV BOLUS (SEPSIS)
1000.0000 mL | Freq: Once | INTRAVENOUS | Status: AC
  Administered 2012-07-28: 1000 mL via INTRAVENOUS

## 2012-07-28 MED ORDER — INSULIN REGULAR HUMAN 100 UNIT/ML IJ SOLN
INTRAMUSCULAR | Status: DC
  Administered 2012-07-29: 7.9 [IU]/h via INTRAVENOUS
  Administered 2012-07-29: 3.2 [IU]/h via INTRAVENOUS
  Administered 2012-07-29: 5.1 [IU]/h via INTRAVENOUS
  Filled 2012-07-28: qty 1

## 2012-07-28 MED ORDER — DIPHENHYDRAMINE HCL 50 MG/ML IJ SOLN
25.0000 mg | Freq: Once | INTRAMUSCULAR | Status: AC
  Administered 2012-07-28: 25 mg via INTRAVENOUS
  Filled 2012-07-28: qty 1

## 2012-07-28 NOTE — ED Notes (Signed)
CBG Exceeds Measure. Notified Nurse Reita Cliche.

## 2012-07-28 NOTE — ED Provider Notes (Signed)
History     CSN: [id number]  Arrival date & time 07/28/12  2152   First MD Initiated Contact with Patient 07/28/12 2315      Chief Complaint  Patient presents with  . Hyperglycemia    (Consider location/radiation/quality/duration/timing/severity/associated sxs/prior treatment) HPI Comments: Patient with history of DM on metformin but non-compliant.  Also extensive history of crack cocaine use which he recently stopped one week ago.  Since that time, he has had no apetite, feels weak, and found out in triage blood sugar was high.  He is also having swelling to the left upper lip but no airway swelling or difficulty breathing.  Patient is a 52 y.o. male presenting with hyperglycemia. The history is provided by the patient.  Hyperglycemia Severity:  Severe Onset quality:  Unable to specify Timing:  Constant Progression:  Worsening Chronicity:  New Current diabetic treatments: Known diabetic on metformin. Relieved by:  Nothing Ineffective treatments:  None tried Associated symptoms: dehydration, fatigue, polyuria and weakness   Associated symptoms: no abdominal pain, no blurred vision, no chest pain, no confusion, no fever and no shortness of breath     Past Medical History  Diagnosis Date  . Diabetes mellitus   . Hypertension   . Cocaine abuse     Past Surgical History  Procedure Laterality Date  . Inguinal hernia repair  04/01/2012  . Inguinal hernia repair  04/01/2012    Procedure: HERNIA REPAIR INGUINAL ADULT;  Surgeon: Currie Paris, MD;  Location: [facility] OR;  Service: General;  Laterality: Right;    Family History  Problem Relation Age of Onset  . Diabetes Father     History  Substance Use Topics  . Smoking status: Current Every Day Smoker -- 0.25 packs/day for 30 years    Types: Cigarettes  . Smokeless tobacco: Never Used  . Alcohol Use: No      Review of Systems  Constitutional: Positive for activity change, appetite change and fatigue. Negative for  fever and chills.  Eyes: Negative for blurred vision.  Respiratory: Negative for shortness of breath.   Cardiovascular: Negative for chest pain.  Gastrointestinal: Negative for abdominal pain.  Endocrine: Positive for polyuria.  Psychiatric/Behavioral: Negative for confusion.  All other systems reviewed and are negative.    Allergies  Review of patient's allergies indicates no known allergies.  Home Medications   Current Outpatient Rx  Name  Route  Sig  Dispense  Refill  . lisinopril (PRINIVIL,ZESTRIL) 20 MG tablet   Oral   Take 20 mg by mouth daily.         . metFORMIN (GLUCOPHAGE) 500 MG tablet   Oral   Take 500 mg by mouth 2 (two) times daily with a meal.          . sulfamethoxazole-trimethoprim (BACTRIM DS) 800-160 MG per tablet   Oral   Take 1 tablet by mouth 2 (two) times daily.           BP 135/78  Pulse 91  Temp(Src) 98.6 F (37 C) (Oral)  Resp 26  SpO2 89%  Physical Exam  Nursing note and vitals reviewed. Constitutional: He is oriented to person, place, and time. He appears well-developed and well-nourished. No distress.  HENT:  Head: Normocephalic and atraumatic.  MM's dry.  There is significant swelling to the left upper lip.    Eyes: EOM are normal. Pupils are equal, round, and reactive to light.  Neck: Normal range of motion. Neck supple.  Cardiovascular: Normal rate, regular rhythm and  normal heart sounds.   No murmur heard. Pulmonary/Chest: Effort normal and breath sounds normal. No respiratory distress. He has no wheezes.  Abdominal: Soft. Bowel sounds are normal. He exhibits no distension. There is no tenderness.  Musculoskeletal: Normal range of motion. He exhibits no edema.  Lymphadenopathy:    He has no cervical adenopathy.  Neurological: He is alert and oriented to person, place, and time. No cranial nerve deficit. Coordination normal.  Skin: Skin is warm and dry. He is not diaphoretic.    ED Course  Procedures (including critical  care time)  Labs Reviewed  GLUCOSE, CAPILLARY - Abnormal; Notable for the following:    Glucose-Capillary >600 (*)    All other components within normal limits  CBC WITH DIFFERENTIAL - Abnormal; Notable for the following:    Platelets 533 (*)    Neutrophils Relative % 79 (*)    All other components within normal limits  COMPREHENSIVE METABOLIC PANEL - Abnormal; Notable for the following:    Sodium 123 (*)    Chloride 89 (*)    Glucose, Bld 847 (*)    Albumin 2.7 (*)    AST 102 (*)    ALT 218 (*)    Alkaline Phosphatase 507 (*)    Total Bilirubin 1.4 (*)    All other components within normal limits  URINALYSIS, ROUTINE W REFLEX MICROSCOPIC  URINE RAPID DRUG SCREEN (HOSP PERFORMED)   No results found.   No diagnosis found.   Date: 07/28/2012  Rate: 90  Rhythm: normal sinus rhythm  QRS Axis: normal  Intervals: normal  ST/T Wave abnormalities: normal  Conduction Disutrbances:none  Narrative Interpretation:   Old EKG Reviewed: none available    MDM  The patient presents with marked hyperglycemia and what appears to be angioedema of the upper lip.  He was given fluids, benadryl, and started on the glucose stablizer.  I have spoken with Dr. Lovell Sheehan from Medicine who agrees to admit.          Geoffery Lyons, MD 07/29/12 [phone]

## 2012-07-28 NOTE — ED Notes (Signed)
PT. REPORTS ELEVATED BLOOD SUGAR THIS EVENING , READS " HIGH " AT GLUCOMETER . ALSO REPORTS RIGHT UPPER BACK PAIN / LEFT UPPER LIP SWELLING .

## 2012-07-29 ENCOUNTER — Encounter (HOSPITAL_COMMUNITY): Payer: Self-pay | Admitting: General Practice

## 2012-07-29 DIAGNOSIS — R5381 Other malaise: Secondary | ICD-10-CM

## 2012-07-29 DIAGNOSIS — IMO0001 Reserved for inherently not codable concepts without codable children: Secondary | ICD-10-CM

## 2012-07-29 DIAGNOSIS — R739 Hyperglycemia, unspecified: Secondary | ICD-10-CM | POA: Diagnosis present

## 2012-07-29 DIAGNOSIS — M129 Arthropathy, unspecified: Secondary | ICD-10-CM

## 2012-07-29 DIAGNOSIS — T783XXA Angioneurotic edema, initial encounter: Secondary | ICD-10-CM

## 2012-07-29 DIAGNOSIS — R7309 Other abnormal glucose: Secondary | ICD-10-CM

## 2012-07-29 DIAGNOSIS — R531 Weakness: Secondary | ICD-10-CM | POA: Diagnosis present

## 2012-07-29 DIAGNOSIS — M1990 Unspecified osteoarthritis, unspecified site: Secondary | ICD-10-CM | POA: Diagnosis present

## 2012-07-29 DIAGNOSIS — F1410 Cocaine abuse, uncomplicated: Secondary | ICD-10-CM

## 2012-07-29 DIAGNOSIS — IMO0002 Reserved for concepts with insufficient information to code with codable children: Secondary | ICD-10-CM | POA: Diagnosis present

## 2012-07-29 DIAGNOSIS — E1165 Type 2 diabetes mellitus with hyperglycemia: Secondary | ICD-10-CM | POA: Diagnosis present

## 2012-07-29 DIAGNOSIS — F1490 Cocaine use, unspecified, uncomplicated: Secondary | ICD-10-CM

## 2012-07-29 LAB — RAPID URINE DRUG SCREEN, HOSP PERFORMED
Amphetamines: NOT DETECTED
Barbiturates: NOT DETECTED
Tetrahydrocannabinol: NOT DETECTED

## 2012-07-29 LAB — CBC
Hemoglobin: 12 g/dL — ABNORMAL LOW (ref 13.0–17.0)
MCHC: 35.2 g/dL (ref 30.0–36.0)
RDW: 14.1 % (ref 11.5–15.5)
WBC: 8.5 10*3/uL (ref 4.0–10.5)

## 2012-07-29 LAB — BASIC METABOLIC PANEL
BUN: 9 mg/dL (ref 6–23)
GFR calc Af Amer: >90 mL/min (ref >90)
GFR calc non Af Amer: >90 mL/min (ref >90)
Potassium: 4.1 mEq/L (ref 3.5–5.1)

## 2012-07-29 LAB — GLUCOSE, CAPILLARY
Glucose-Capillary: 234 mg/dL — ABNORMAL HIGH (ref 70–99)
Glucose-Capillary: 224 mg/dL — ABNORMAL HIGH (ref 70–99)
Glucose-Capillary: 254 mg/dL — ABNORMAL HIGH (ref 70–99)
Glucose-Capillary: 199 mg/dL — ABNORMAL HIGH (ref 70–99)
Glucose-Capillary: 110 mg/dL — ABNORMAL HIGH (ref 70–99)
Glucose-Capillary: 167 mg/dL — ABNORMAL HIGH (ref 70–99)
Glucose-Capillary: 114 mg/dL — ABNORMAL HIGH (ref 70–99)
Glucose-Capillary: 143 mg/dL — ABNORMAL HIGH (ref 70–99)
Glucose-Capillary: 318 mg/dL — ABNORMAL HIGH (ref 70–99)

## 2012-07-29 LAB — MRSA PCR SCREENING: MRSA by PCR: NEGATIVE

## 2012-07-29 MED ORDER — DEXTROSE 50 % IV SOLN: 25.0000 mL | INTRAVENOUS | Status: DC | PRN

## 2012-07-29 MED ORDER — ACETAMINOPHEN 325 MG PO TABS: 650.0000 mg | ORAL_TABLET | Freq: Four times a day (QID) | ORAL | Status: DC | PRN

## 2012-07-29 MED ORDER — ONDANSETRON HCL 4 MG/2ML IJ SOLN: 4.0000 mg | Freq: Four times a day (QID) | INTRAMUSCULAR | Status: DC | PRN

## 2012-07-29 MED ORDER — HYDROMORPHONE HCL PF 1 MG/ML IJ SOLN: 0.5000 mg | INTRAMUSCULAR | Status: DC | PRN

## 2012-07-29 MED ORDER — DEXTROSE-NACL 5-0.45 % IV SOLN
INTRAVENOUS | Status: DC
  Administered 2012-07-29: 03:00:00 via INTRAVENOUS

## 2012-07-29 MED ORDER — OXYCODONE HCL 5 MG PO TABS
5.0000 mg | ORAL_TABLET | ORAL | Status: DC | PRN
  Administered 2012-07-29 – 2012-07-30 (×6): 5 mg via ORAL
  Filled 2012-07-29 (×6): qty 1

## 2012-07-29 MED ORDER — ACETAMINOPHEN 650 MG RE SUPP: 650.0000 mg | Freq: Four times a day (QID) | RECTAL | Status: DC | PRN

## 2012-07-29 MED ORDER — SODIUM CHLORIDE 0.9 % IV SOLN: INTRAVENOUS | Status: DC

## 2012-07-29 MED ORDER — DIPHENHYDRAMINE HCL 50 MG/ML IJ SOLN: 25.0000 mg | Freq: Four times a day (QID) | INTRAMUSCULAR | Status: DC | PRN

## 2012-07-29 MED ORDER — ADULT MULTIVITAMIN W/MINERALS CH
1.0000 | ORAL_TABLET | Freq: Every day | ORAL | Status: DC
  Administered 2012-07-29 – 2012-07-30 (×2): 1 via ORAL
  Filled 2012-07-29 (×2): qty 1

## 2012-07-29 MED ORDER — GLUCERNA SHAKE PO LIQD
237.0000 mL | ORAL | Status: DC
  Administered 2012-07-29 – 2012-07-30 (×2): 237 mL via ORAL

## 2012-07-29 MED ORDER — INSULIN ASPART 100 UNIT/ML ~~LOC~~ SOLN
0.0000 [IU] | Freq: Every day | SUBCUTANEOUS | Status: DC
  Administered 2012-07-29: 4 [IU] via SUBCUTANEOUS

## 2012-07-29 MED ORDER — ENOXAPARIN SODIUM 40 MG/0.4ML ~~LOC~~ SOLN
40.0000 mg | SUBCUTANEOUS | Status: DC
  Administered 2012-07-29 – 2012-07-30 (×2): 40 mg via SUBCUTANEOUS
  Filled 2012-07-29 (×2): qty 0.4

## 2012-07-29 MED ORDER — METHOCARBAMOL 500 MG PO TABS
500.0000 mg | ORAL_TABLET | Freq: Three times a day (TID) | ORAL | Status: DC | PRN
  Administered 2012-07-29: 500 mg via ORAL
  Filled 2012-07-29: qty 1

## 2012-07-29 MED ORDER — ZOLPIDEM TARTRATE 5 MG PO TABS: 5.0000 mg | ORAL_TABLET | Freq: Every evening | ORAL | Status: DC | PRN

## 2012-07-29 MED ORDER — ENOXAPARIN SODIUM 150 MG/ML ~~LOC~~ SOLN: 1.0000 mg/kg | Freq: Two times a day (BID) | SUBCUTANEOUS | Status: DC

## 2012-07-29 MED ORDER — INSULIN ASPART 100 UNIT/ML ~~LOC~~ SOLN
0.0000 [IU] | Freq: Three times a day (TID) | SUBCUTANEOUS | Status: DC
  Administered 2012-07-29: 2 [IU] via SUBCUTANEOUS
  Administered 2012-07-30: 13:00:00 via SUBCUTANEOUS
  Administered 2012-07-30: 5 [IU] via SUBCUTANEOUS
  Administered 2012-07-30: 8 [IU] via SUBCUTANEOUS

## 2012-07-29 MED ORDER — SODIUM CHLORIDE 0.9 % IV SOLN: INTRAVENOUS | Status: AC

## 2012-07-29 MED ORDER — SODIUM CHLORIDE 0.9 % IV SOLN
INTRAVENOUS | Status: DC
  Filled 2012-07-29: qty 1

## 2012-07-29 MED ORDER — ONDANSETRON HCL 4 MG PO TABS: 4.0000 mg | ORAL_TABLET | Freq: Four times a day (QID) | ORAL | Status: DC | PRN

## 2012-07-29 MED ORDER — ALUM & MAG HYDROXIDE-SIMETH 200-200-20 MG/5ML PO SUSP
30.0000 mL | Freq: Four times a day (QID) | ORAL | Status: DC | PRN
  Filled 2012-07-29: qty 30

## 2012-07-29 MED ORDER — LORAZEPAM 2 MG/ML IJ SOLN: 1.0000 mg | INTRAMUSCULAR | Status: DC | PRN

## 2012-07-29 MED ORDER — SODIUM CHLORIDE 0.9 % IV SOLN
INTRAVENOUS | Status: DC
  Administered 2012-07-29: 10 mL/h via INTRAVENOUS

## 2012-07-29 MED ORDER — INSULIN DETEMIR 100 UNIT/ML ~~LOC~~ SOLN
8.0000 [IU] | Freq: Once | SUBCUTANEOUS | Status: AC
  Administered 2012-07-29: 8 [IU] via SUBCUTANEOUS
  Filled 2012-07-29: qty 0.08

## 2012-07-29 MED ORDER — INSULIN DETEMIR 100 UNIT/ML ~~LOC~~ SOLN
10.0000 [IU] | Freq: Every day | SUBCUTANEOUS | Status: DC
  Administered 2012-07-29: 10 [IU] via SUBCUTANEOUS
  Filled 2012-07-29 (×2): qty 0.1

## 2012-07-29 MED ORDER — HYDROMORPHONE HCL PF 1 MG/ML IJ SOLN
1.0000 mg | Freq: Once | INTRAMUSCULAR | Status: AC
  Administered 2012-07-29: 1 mg via INTRAVENOUS
  Filled 2012-07-29: qty 1

## 2012-07-29 MED ORDER — INSULIN REGULAR BOLUS VIA INFUSION
0.0000 [IU] | Freq: Three times a day (TID) | INTRAVENOUS | Status: DC
  Administered 2012-07-29: 3.5 [IU] via INTRAVENOUS
  Administered 2012-07-29: 8.1 [IU] via INTRAVENOUS
  Filled 2012-07-29: qty 10

## 2012-07-29 NOTE — Progress Notes (Signed)
Pt is complaining of pain in right back/shoulder area. States is new pain to him, pain improves with repositioning and resolves with medication. MD notified. Will continue to monitor.

## 2012-07-29 NOTE — Progress Notes (Signed)
Pt with 4 consecutive CBGs under 180. MD notified. Will continue to monitor.

## 2012-07-29 NOTE — Progress Notes (Signed)
INITIAL NUTRITION ASSESSMENT  DOCUMENTATION CODES Per approved criteria  -Severe  malnutrition in the context of social or environmental circumstances   INTERVENTION: 1. MVI daily 2. Glucerna Shake po daily, each supplement provides 220 kcal and 10 grams of protein. 3. RD to continue to follow nutrition care plan  NUTRITION DIAGNOSIS: Inadequate oral intake related to poor appetite and drug abuse as evidenced by pt report.   Goal: Intake to meet >90% of estimated nutrition needs.  Monitor:  weight trends, lab trends, I/O's, PO intake, supplement tolerance  Reason for Assessment: Malnutrition Screening  52 y.o. male  Admitting Dx: angio-edema of upper lip  ASSESSMENT: Hx of DM2 and cocaine abuse. Admitted with severe weakness, fatigue and malaise x 1 week. Pt reports that he has been a daily cocaine user for 27 years but stopped 1 wk ago and has been sick ever since. Glucose on admit 800.  Pt with 11% wt loss x 4 months. Currently eating 100% of his Carbohydrate Modified Medium. Pt reports that while he did crack he rarely ate. He would drink regular soda throughout the day and rarely felt hungry enough to eat. Now that he has stopped doing drugs he is finding that he has to fuel his body with food which he is not used to doing. After his first meal, he vomited. Now he is eating 100% and has a great appetite. Interested in supplements to keep his weight/muscle mass up. Discouraged Regular sodas.   Pt with obvious temple wasting. Pt also with two large spider bites on his arms.  Pt meets criteria for severe MALNUTRITION in the context of social/environmental circumstances as evidenced by <50% of estimated energy intake x 1 month, 11% wt loss x 4 months, severe muscle mass loss.  Height: Ht Readings from Last 1 Encounters:  07/29/12 5\' 8"  (1.727 m)    Weight: Wt Readings from Last 1 Encounters:  07/29/12 153 lb 3.5 oz (69.5 kg)    Ideal Body Weight: 154 lb  % Ideal Body  Weight: 99%  Wt Readings from Last 10 Encounters:  07/29/12 153 lb 3.5 oz (69.5 kg)  04/29/12 169 lb 9.6 oz (76.93 kg)  04/01/12 174 lb 11.2 oz (79.243 kg)  04/01/12 174 lb 11.2 oz (79.243 kg)  03/30/12 173 lb 1 oz (78.5 kg)  03/20/12 171 lb 6.4 oz (77.747 kg)  01/26/12 160 lb (72.576 kg)    Usual Body Weight: 171 - 174 lb  % Usual Body Weight: 89%; 11% wt loss x 4 months  BMI:  Body mass index is 23.3 kg/(m^2). WNL  Estimated Nutritional Needs: Kcal: 1800 - 2000 Protein: 85 - 95 grams Fluid: 1.8 - 2 liters  Skin: intact  Diet Order: Carb Control Medium (1600 - 2000)  EDUCATION NEEDS: -Education needs addressed - briefly   Intake/Output Summary (Last 24 hours) at 07/29/12 0934 Last data filed at 07/29/12 0644  Gross per 24 hour  Intake 799.93 ml  Output    700 ml  Net  99.93 ml    Last BM: 5/20  Labs:   Recent Labs Lab 07/28/12 2207 07/29/12 0637  NA 123* 136  K 5.0 4.1  CL 89* 102  CO2 19 22  BUN 10 9  CREATININE 0.80 0.73  CALCIUM 9.5 8.8  GLUCOSE 847* 178*    CBG (last 3)   Recent Labs  07/29/12 0640 07/29/12 0737 07/29/12 0840  GLUCAP 110* 184* 167*    Scheduled Meds: . sodium chloride   Intravenous STAT  .  enoxaparin (LOVENOX) injection  40 mg Subcutaneous Q24H  . insulin regular  0-10 Units Intravenous TID WC    Continuous Infusions: . sodium chloride    . sodium chloride    . dextrose 5 % and 0.45% NaCl 100 mL/hr at 07/29/12 0236  . insulin (NOVOLIN-R) infusion 4.3 Units/hr (07/29/12 0839)  . insulin (NOVOLIN-R) infusion      Past Medical History  Diagnosis Date  . Hypertension   . Cocaine abuse   . Type II diabetes mellitus   . Arthritis     "hands" (07/28/2012)  . Angioedema of lips 07/28/2012    left upper (07/29/2012)    Past Surgical History  Procedure Laterality Date  . Inguinal hernia repair  04/01/2012    Procedure: HERNIA REPAIR INGUINAL ADULT;  Surgeon: Currie Paris, MD;  Location: [facility] OR;  Service:  General;  Laterality: Right;  . Hernia repair Right 04/01/2012  . Incision and drainage of wound      boil on back/notes 07/14/2008 (07/29/2012)    Jarold Motto MS, RD, LDN Pager: [phone] After-hours pager: [phone]

## 2012-07-29 NOTE — Progress Notes (Signed)
Utilization review completed.  

## 2012-07-29 NOTE — ED Notes (Signed)
Patients emergency contact is his minister Donaciano Eva [phone]

## 2012-07-29 NOTE — Progress Notes (Signed)
Brief Nutrition Note  RD consulted for nutrition education regarding diabetes. RD completed full assessment earlier today.  Pt tells me that in the past two days he has eaten the following: 1 box of Frosted Flakes with whole milk; an entire carton of oatmeal with syrup; 2 large vanilla milkshakes from [employer]; 1-20 oz grape soda; 1-20 oz orange soda; 1 2-liter sweet tea.  No results found for this basename: HGBA1C    RD provided "Carbohydrate Counting for People with Diabetes" handout from the Academy of Nutrition and Dietetics. Discussed different food groups and their effects on blood sugar, emphasizing carbohydrate-containing foods. Provided list of carbohydrates and recommended serving sizes of common foods.  Discussed importance of controlled and consistent carbohydrate intake throughout the day. Provided examples of ways to balance meals/snacks and encouraged intake of high-fiber, whole grain complex carbohydrates. Teach back method used.  Expect poor compliance.  Body mass index is 23.3 kg/(m^2). Pt meets criteria for normal weight based on current BMI.  Current diet order is Carbohydrate Modified Medium, patient is consuming approximately 100% of meals at this time. Labs and medications reviewed. No further nutrition interventions warranted at this time. RD contact information provided. If additional nutrition issues arise, please re-consult RD.  Jarold Motto MS, RD, LDN Pager: [phone] After-hours pager: [phone]

## 2012-07-29 NOTE — Progress Notes (Signed)
Patient seen and examined. Admitted after midnight secondary to angioedema and hyperglycemia. Please refer to Dr. York Ram H&P for further details on admission.  Plan: -transition to SSI and long acting; > than for CBG's < 180 -complaining of msk back pain will use PRN robaxin -angioedema resolved. Will avoid ACE in the future -further treatment as per orders and per patient evolution.  David Wyatt [phone]

## 2012-07-29 NOTE — Progress Notes (Signed)
Inpatient Diabetes Program Recommendations  AACE/ADA: New Consensus Statement on Inpatient Glycemic Control (2013)  Target Ranges:  Prepandial:   less than 140 mg/dL      Peak postprandial:   less than 180 mg/dL (1-2 hours)      Critically ill patients:  140 - 180 mg/dL    Admitted with hyperglycemia.  Glucose 847 mg/dl on admission.  Patient has history of diabetes.  Supposed to be taking Metformin 500 mg bid at home to control CBGs.  According to patient, he stopped taking crack and cocaine about 7 days ago and wound up in ED with symptoms of severe hyperglycemia.  Spoke with patient about the importance of good CBG control to prevent both acute and chronic complications of diabetes.  Patient was very talkative and kept telling me that he did not understand that his "body was addicted to drugs" as well.  He told me he was ready mentally to quit, but that he didn't know his body would have issues.  Patient went on to tell me that the nurses at the [facility] center) told him he doesn't need a CBG meter at home since he was only taking pills.  Patient also told me he tries to take his Metformin, however, he was not always regular with taking it.  Patient stated that while he was taking illegal drugs, he would forget to eat but when he would eat, he would eat junk food like regular soda and chips, etc.  Discussed the sign and symptoms of hyperglycemia with patient and reviewed basic home care principles with patient. Also discussed what an A1c test is and what it measures.  Reminded patient that we have drawn an A1c test for him and that we are waiting for the results.  Explained to patient that the results of this test will likely help guide what medications he is discharged home with.  Gave patient printed educational material about hyperglycemia, A1c, and how to avoid problems with diabetes.  Have asked patient to please watch the DM videos on the patient education network.  RNs to  provide ongoing DM education with patient at bedside.  Will attempt to make a follow-up appointment for patient at the [facility] since patient will need a PCP to follow with after d/c.    Will follow. Ambrose Finland RN, MSN, CDE Diabetes Coordinator Inpatient Diabetes Program [phone]

## 2012-07-29 NOTE — H&P (Addendum)
[facility] History and Physical  Royston Bekele ZOX:[id number] DOB: 08/09/1960 DOA: 07/28/2012  Referring physician: EDP PCP: Default, Provider, MD Specialists:   Chief Complaint: Weakness, and Lip Swelling  HPI: David Wyatt is a 52 y.o. male with a history of DM2 and Cocaine Abuse who presents to the ED with complaints of severe weakness fatigue and malaise for the past 1 week.  He reports having chills and sweats, but denies having any nausea or vomiting.  He reports that his upper lip began to swell during th e past 24 hours, he denies having any throat swelling or difficulty breathing.  He reports that hi was a daily cocaine abuser for 27 years but stopped 1 week ago and he has been sick ever since.   He was found to have an initial glucose level of 800 in the ED, and he was placed on the Glucostabilizer.      Review of Systems: The patient denies anorexia, fever, chills, headaches, weight loss, vision loss, decreased hearing, hoarseness, chest pain, syncope, dyspnea on exertion, peripheral edema, balance deficits, hemoptysis, abdominal pain, nausea, vomiting, diarrhea, constipation, melena, hematochezia, severe indigestion/heartburn, hematuria, incontinence, dysuria, suspicious skin lesions, transient blindness, difficulty walking, depression, unusual weight change, abnormal bleeding, enlarged lymph nodes, angioedema, and breast masses.    Past Medical History  Diagnosis Date  . Hypertension   . Cocaine abuse   . Type II diabetes mellitus   . Arthritis     "hands" (07/28/2012)  . Angioedema of lips 07/28/2012    left upper (07/29/2012)    Past Surgical History  Procedure Laterality Date  . Inguinal hernia repair  04/01/2012    Procedure: HERNIA REPAIR INGUINAL ADULT;  Surgeon: Currie Paris, MD;  Location: [facility] OR;  Service: General;  Laterality: Right;  . Hernia repair Right 04/01/2012  . Incision and drainage of wound      boil on back/notes 07/14/2008 (07/29/2012)     Medications:  HOME MEDS: Prior to Admission medications   Medication Sig Start Date End Date Taking? Authorizing Provider  lisinopril (PRINIVIL,ZESTRIL) 20 MG tablet Take 20 mg by mouth daily.   Yes Historical Provider, MD  metFORMIN (GLUCOPHAGE) 500 MG tablet Take 500 mg by mouth 2 (two) times daily with a meal.    Yes Historical Provider, MD  sulfamethoxazole-trimethoprim (BACTRIM DS) 800-160 MG per tablet Take 1 tablet by mouth 2 (two) times daily.   Yes Historical Provider, MD    Allergies:  No Known Allergies    Social History:   reports that he has been smoking Cigarettes.  He has a 7.5 pack-year smoking history. He has never used smokeless tobacco. He reports that he uses illicit drugs ("Crack" cocaine and Cocaine). He reports that he does not drink alcohol.    Family History: Family History  Problem Relation Age of Onset  . Diabetes Father      Physical Exam:  GEN:  Pleasant examined  and in no acute distress; cooperative with exam Filed Vitals:   07/29/12 0100 07/29/12 0115 07/29/12 0130 07/29/12 0154  BP: 140/87 148/76 140/85 127/80  Pulse: 91 87 84 87  Temp:    98.1 F (36.7 C)  TempSrc:    Oral  Resp: 21 27 18 18   Height:    5\' 8"  (1.727 m)  Weight:    69.5 kg (153 lb 3.5 oz)  SpO2: 95% 94% 93% 92%   Blood pressure 127/80, pulse 87, temperature 98.1 F (36.7 C), temperature source Oral, resp. rate 18, height  5\' 8"  (1.727 m), weight 69.5 kg (153 lb 3.5 oz), SpO2 92.00%. PSYCH:  He is alert and oriented x4; does not appear anxious does not appear depressed; affect is normal HEENT: Normocephalic and Atraumatic, Mucous membranes pink; PERRLA; EOM intact; Fundi:  Benign;  No scleral icterus, Nares: Patent, Oropharynx: + Edematous Upper Lip 3+,  Clear, Almost Edentulous,  Neck:  FROM, no cervical lymphadenopathy nor thyromegaly or carotid bruit; no JVD; Breasts:: Not examined CHEST WALL: No tenderness CHEST: Normal respiration, clear to auscultation  bilaterally HEART: Regular rate and rhythm; no murmurs rubs or gallops BACK: No kyphosis or scoliosis; no CVA tenderness ABDOMEN: Positive Bowel Sounds, Scaphoid, soft non-tender; no masses, no organomegaly. Rectal Exam: Not done EXTREMITIES: No cyanosis, clubbing or edema; no ulcerations. Genitalia: not examined PULSES: 2+ and symmetric SKIN: Normal hydration no rash or ulceration CNS: Cranial nerves 2-12 grossly intact no focal neurologic deficit    Labs & Imaging Results for orders placed during the hospital encounter of 07/28/12 (from the past 48 hour(s))  GLUCOSE, CAPILLARY     Status: Abnormal   Collection Time    07/28/12 10:02 PM      Result Value Range   Glucose-Capillary >600 (*) 70 - 99 mg/dL   Comment 1 Documented in Chart     Comment 2 Notify RN    CBC WITH DIFFERENTIAL     Status: Abnormal   Collection Time    07/28/12 10:07 PM      Result Value Range   WBC 7.3  4.0 - 10.5 K/uL   RBC 4.37  4.22 - 5.81 MIL/uL   Hemoglobin 13.1  13.0 - 17.0 g/dL   HCT 16.1  09.6 - 04.5 %   MCV 90.4  78.0 - 100.0 fL   MCH 30.0  26.0 - 34.0 pg   MCHC 33.2  30.0 - 36.0 g/dL   RDW 40.9  81.1 - 91.4 %   Platelets 533 (*) 150 - 400 K/uL   Neutrophils Relative % 79 (*) 43 - 77 %   Lymphocytes Relative 15  12 - 46 %   Monocytes Relative 6  3 - 12 %   Eosinophils Relative 0  0 - 5 %   Basophils Relative 0  0 - 1 %   Neutro Abs 5.8  1.7 - 7.7 K/uL   Lymphs Abs 1.1  0.7 - 4.0 K/uL   Monocytes Absolute 0.4  0.1 - 1.0 K/uL   Eosinophils Absolute 0.0  0.0 - 0.7 K/uL   Basophils Absolute 0.0  0.0 - 0.1 K/uL   WBC Morphology FEW ATYPICAL LYMPHS NOTED    COMPREHENSIVE METABOLIC PANEL     Status: Abnormal   Collection Time    07/28/12 10:07 PM      Result Value Range   Sodium 123 (*) 135 - 145 mEq/L   Potassium 5.0  3.5 - 5.1 mEq/L   Chloride 89 (*) 96 - 112 mEq/L   CO2 19  19 - 32 mEq/L   Glucose, Bld 847 (*) 70 - 99 mg/dL   Comment: CRITICAL RESULT CALLED TO, READ BACK BY AND  VERIFIED WITH:     STEELEE A,RN 07/28/12 2325 WAYK   BUN 10  6 - 23 mg/dL   Creatinine, Ser 7.82  0.50 - 1.35 mg/dL   Calcium 9.5  8.4 - 95.6 mg/dL   Total Protein 8.0  6.0 - 8.3 g/dL   Albumin 2.7 (*) 3.5 - 5.2 g/dL   AST 213 (*) 0 -  37 U/L   ALT 218 (*) 0 - 53 U/L   Alkaline Phosphatase 507 (*) 39 - 117 U/L   Total Bilirubin 1.4 (*) 0.3 - 1.2 mg/dL   GFR calc non Af Amer >90  >90 mL/min   GFR calc Af Amer >90  >90 mL/min   Comment:            The eGFR has been calculated     using the CKD EPI equation.     This calculation has not been     validated in all clinical     situations.     eGFR's persistently     <90 mL/min signify     possible Chronic Kidney Disease.  URINALYSIS, ROUTINE W REFLEX MICROSCOPIC     Status: Abnormal   Collection Time    07/28/12 11:04 PM      Result Value Range   Color, Urine YELLOW  YELLOW   APPearance CLEAR  CLEAR   Specific Gravity, Urine 1.031 (*) 1.005 - 1.030   pH 6.0  5.0 - 8.0   Glucose, UA >1000 (*) NEGATIVE mg/dL   Hgb urine dipstick NEGATIVE  NEGATIVE   Bilirubin Urine NEGATIVE  NEGATIVE   Ketones, ur NEGATIVE  NEGATIVE mg/dL   Protein, ur NEGATIVE  NEGATIVE mg/dL   Urobilinogen, UA 0.2  0.0 - 1.0 mg/dL   Nitrite NEGATIVE  NEGATIVE   Leukocytes, UA NEGATIVE  NEGATIVE  URINE RAPID DRUG SCREEN (HOSP PERFORMED)     Status: None   Collection Time    07/28/12 11:04 PM      Result Value Range   Opiates NONE DETECTED  NONE DETECTED   Cocaine NONE DETECTED  NONE DETECTED   Benzodiazepines NONE DETECTED  NONE DETECTED   Amphetamines NONE DETECTED  NONE DETECTED   Tetrahydrocannabinol NONE DETECTED  NONE DETECTED   Barbiturates NONE DETECTED  NONE DETECTED   Comment:            DRUG SCREEN FOR MEDICAL PURPOSES     ONLY.  IF CONFIRMATION IS NEEDED     FOR ANY PURPOSE, NOTIFY LAB     WITHIN 5 DAYS.                LOWEST DETECTABLE LIMITS     FOR URINE DRUG SCREEN     Drug Class       Cutoff (ng/mL)     Amphetamine      1000      Barbiturate      200     Benzodiazepine   200     Tricyclics       300     Opiates          300     Cocaine          300     THC              50  URINE MICROSCOPIC-ADD ON     Status: None   Collection Time    07/28/12 11:04 PM      Result Value Range   Squamous Epithelial / LPF RARE  RARE   Bacteria, UA RARE  RARE  GLUCOSE, CAPILLARY     Status: Abnormal   Collection Time    07/29/12  1:20 AM      Result Value Range   Glucose-Capillary 377 (*) 70 - 99 mg/dL    EKG: Independently reviewed.   Assessment/Plan Active Problems:   Hyperglycemia   Uncontrolled  diabetes mellitus   Angioedema of lips   Crack cocaine use   Arthritis   Weakness generalized   1.  Hyperglycemia- Nonketotic Hyperosmolar- place on IV Insulin Glucostabilizer, and IVFs.  Monitor Glucose levels and electrolytes, transition to SSI coverage and adjust DM home medications.    2.  Uncontrolled DM2-  Issues of Noncompliance, counseling and diabetes education needed.    3.   Angioedema-due to ACE-Inhibitor therapy , discontinued ACE Rx, and monitor, no signs of airway compromise.  IV Benadryl PRN.    4.   Cocaine Abuse-  Continue abstinence, Request Substance Abuse Counseling when medically clear.    5.   Arthritis-  Pain control PRN.     6.   Weakness- due to #1, #2,and withdrawal from #3.   Plans as per each #.       Code Status:   FULL CODE Family Communication:   No Family Present Disposition Plan:   Return to Home on Discharge  Time spent: 74 Minutes  Ron Parker [facility] Pager [phone]  If 7PM-7AM, please contact night-coverage [email or web address] Password [email or web address] 07/29/2012, 3:12 AM

## 2012-07-29 NOTE — Progress Notes (Signed)
Patient admitted to room 6736. Pt alert and oriented, ambulatory. Pt denies any pain. Upper lip swollen. Pt oriented to unit. Bed in lowest position. Call bell within reach.

## 2012-07-30 DIAGNOSIS — I10 Essential (primary) hypertension: Secondary | ICD-10-CM

## 2012-07-30 DIAGNOSIS — Z5189 Encounter for other specified aftercare: Secondary | ICD-10-CM

## 2012-07-30 LAB — GLUCOSE, CAPILLARY
Glucose-Capillary: 237 mg/dL — ABNORMAL HIGH (ref 70–99)
Glucose-Capillary: 274 mg/dL — ABNORMAL HIGH (ref 70–99)

## 2012-07-30 MED ORDER — FREESTYLE SYSTEM KIT: 1.0000 | PACK | Status: DC | PRN

## 2012-07-30 MED ORDER — OXYCODONE HCL 5 MG PO TABS: 5.0000 mg | ORAL_TABLET | ORAL | Status: DC | PRN

## 2012-07-30 MED ORDER — METHOCARBAMOL 500 MG PO TABS: 500.0000 mg | ORAL_TABLET | Freq: Three times a day (TID) | ORAL | Status: DC | PRN

## 2012-07-30 MED ORDER — FREESTYLE LANCETS MISC: Status: DC

## 2012-07-30 MED ORDER — METFORMIN HCL 500 MG PO TABS: 500.0000 mg | ORAL_TABLET | Freq: Two times a day (BID) | ORAL | Status: DC

## 2012-07-30 MED ORDER — GLUCOSE BLOOD VI STRP: ORAL_STRIP | Status: DC

## 2012-07-30 MED ORDER — ISOSORB DINITRATE-HYDRALAZINE 20-37.5 MG PO TABS: 1.0000 | ORAL_TABLET | Freq: Two times a day (BID) | ORAL | Status: DC

## 2012-07-30 MED ORDER — GLIPIZIDE 5 MG PO TABS: 5.0000 mg | ORAL_TABLET | Freq: Two times a day (BID) | ORAL | Status: DC

## 2012-07-30 NOTE — Care Management Note (Addendum)
   CARE MANAGEMENT NOTE 07/30/2012  Patient:  David Wyatt, David Wyatt   Account Number:  [id number]  Date Initiated:  07/29/2012  Documentation initiated by:  Darlyne Russian  Subjective/Objective Assessment:   Patient admitted with glucose 800.     Action/Plan:   Progression of care and discharge planning  07/30/2012 Spoke with diabetes educator, Campbell Lerner who states that she will attempt to setup pt [facility] at [facility] with Dr Laural Benes.  5/22 Per diabetes education, they were unable to arrange an appointment. Message left by this CM asking for appointment. Will continue to follow and setup at [facility] if needed.   Anticipated DC Date:  07/31/2012   Anticipated DC Plan:  HOME/SELF CARE         Choice offered to / List presented to:             Status of service:  In process, will continue to follow Medicare Important Message given?   (If response is "NO", the following Medicare IM given date fields will be blank) Date Medicare IM given:   Date Additional Medicare IM given:    Discharge Disposition:  HOME/SELF CARE  Per UR Regulation:    If discussed at Long Length of Stay Meetings, dates discussed:    Comments:  07/30/2012 Pt for d/c to home, diabetes educator is attempting to set pt up at Adult [facility] to followup with Dr Laural Benes. Johny Shock RN MPH Case Manager [phone]

## 2012-07-30 NOTE — Care Management Note (Addendum)
   CARE MANAGEMENT NOTE 07/30/2012  Patient:  David Wyatt, David Wyatt   Account Number:  [id number]  Date Initiated:  07/29/2012  Documentation initiated by:  Darlyne Russian  Subjective/Objective Assessment:   Patient admitted with glucose 800.     Action/Plan:   Progression of care and discharge planning  07/30/2012 Spoke with diabetes educator, Campbell Lerner who states that she will attempt to setup pt up at [facility] with Dr Laural Benes.   Anticipated DC Date:  07/31/2012   Anticipated DC Plan:  HOME/SELF CARE         Choice offered to / List presented to:             Status of service:  In process, will continue to follow Medicare Important Message given?   (If response is "NO", the following Medicare IM given date fields will be blank) Date Medicare IM given:   Date Additional Medicare IM given:    Discharge Disposition:  HOME/SELF CARE  Per UR Regulation:    If discussed at Long Length of Stay Meetings, dates discussed:    Comments:  07/30/2012 Ongoing efforts to arrange followup for this pt , able to reach [facility] at [facility]. However because this pt is followed by Nurse Practioner at [facility], this is considered his PCP and he is not eligible to be scheduled at another [facility] site. Explained this to pt who will continue to be seen at [facility].Marland Kitchen Pt sponsor here to assist pt and asking for inpatient substance abuse rehab for this pt, referred to CSW. Johny Shock RN MPH Case Manager [phone]  07/30/2012 Pt for d/c to home, diabetes educator is attempting to set pt up at Adult [facility] to followup with Dr Laural Benes. Johny Shock RN MPH Case Manager [phone]

## 2012-07-30 NOTE — Discharge Summary (Signed)
Physician Discharge Summary  David Wyatt ZOX:[id number] DOB: 07/07/1960 DOA: 07/28/2012  PCP: Default, Provider, MD  Admit date: 07/28/2012 Discharge date: 07/30/2012  Time spent: >30 minutes  Recommendations for Outpatient Follow-up:  1. BMET to follow renal function and electrolytes 2. Reassess BP and CBG's; adjust medications as needed  Discharge Diagnoses:  Active Problems:   Hyperglycemia   Uncontrolled diabetes mellitus   Angioedema of lips   Crack cocaine use   Arthritis   Weakness generalized   Discharge Condition: stable and improved. Will discharge home.  Diet recommendation: low carbohydrates and low sodium diet  Filed Weights   07/29/12 0154 07/29/12 2131  Weight: 69.5 kg (153 lb 3.5 oz) 71 kg (156 lb 8.4 oz)    History of present illness:  52 y.o. male with a history of DM2 and Cocaine Abuse who presents to the ED with complaints of severe weakness fatigue and malaise for the past 1 week. He reports having chills and sweats, but denies having any nausea or vomiting. He reports that his upper lip began to swell during th e past 24 hours, he denies having any throat swelling or difficulty breathing. He reports that hi was a daily cocaine abuser for 27 years but stopped 1 week ago and he has been sick ever since. He was found to have an initial glucose level of 800 in the ED, and he was placed on the Glucostabilizer.    Hospital Course:  1-Hyperglycemia-nonketotic hyperosmolar state: treated with IVF's and IV insulin; once sugar stabilized patient transition to SSI and levemir. A1C 7.8 and prior to admission no compliant with medications or diet control. Will discharge on metformin and glipizide; close follow up as an outpatient for adjustments on medications.  2-Diabetes type 2: as mentioned above. A1C 7.8; recommended to folllow low carb diets and to be compliant with medications.  3-Angioedema: due to use of ACE inhibitors. Medication discontinue. At discharge  angioedema resolved  4-HTN: will start patient on bidil and recommend to follow low sodium diet  5-cocaine abuse: quit 1 week prior to admission. No withdrawal symptoms while in the hospital. Resources for outpatient services provided.  6-arthritis: continue PRN pain meds.  7-muscle spasm: continue PRN robaxin.  Procedures: See below for x-ray reports  Consultations:  none  Discharge Exam: Filed Vitals:   07/29/12 1659 07/29/12 2131 07/30/12 0435 07/30/12 0810  BP: 150/99 146/83 138/98 152/81  Pulse:  76 73 98  Temp: 98 F (36.7 C) 98.2 F (36.8 C) 98.2 F (36.8 C) 97.4 F (36.3 C)  TempSrc: Oral Oral Oral Oral  Resp: 18 18 18 18   Height:      Weight:  71 kg (156 lb 8.4 oz)    SpO2: 97% 93% 97% 97%    General: NAD, afebrile; angioedema of lips resolved Cardiovascular: S1 and ?S2, no rubs or gallops; no JVD Respiratory: CTA bilaterally Abdomen: soft, NT, ND positive BS Extremities:no edema or cyanosis Neuro: non focal  Discharge Instructions  Discharge Orders   Future Orders Complete By Expires     Diet - low sodium heart healthy  As directed     Discharge instructions  As directed     Comments:      Take medications as prescribed Follow a low carbohydrates and low sodium diet Follow as instructed for primary care services and medication adjustments        Medication List    STOP taking these medications       lisinopril 20 MG tablet  Commonly known as:  PRINIVIL,ZESTRIL     sulfamethoxazole-trimethoprim 800-160 MG per tablet  Commonly known as:  BACTRIM DS      TAKE these medications       glipiZIDE 5 MG tablet  Commonly known as:  GLUCOTROL  Take 1 tablet (5 mg total) by mouth 2 (two) times daily before a meal.     isosorbide-hydrALAZINE 20-37.5 MG per tablet  Commonly known as:  BIDIL  Take 1 tablet by mouth 2 (two) times daily.     metFORMIN 500 MG tablet  Commonly known as:  GLUCOPHAGE  Take 1 tablet (500 mg total) by mouth 2 (two)  times daily with a meal.     methocarbamol 500 MG tablet  Commonly known as:  ROBAXIN  Take 1 tablet (500 mg total) by mouth every 8 (eight) hours as needed (shoulder pain).     oxyCODONE 5 MG immediate release tablet  Commonly known as:  Oxy IR/ROXICODONE  Take 1 tablet (5 mg total) by mouth every 4 (four) hours as needed.       No Known Allergies    The results of significant diagnostics from this hospitalization (including imaging, microbiology, ancillary and laboratory) are listed below for reference.    Significant Diagnostic Studies: No results found.  Microbiology: Recent Results (from the past 240 hour(s))  MRSA PCR SCREENING     Status: None   Collection Time    07/29/12  3:05 AM      Result Value Range Status   MRSA by PCR NEGATIVE  NEGATIVE Final   Comment:            The GeneXpert MRSA Assay (FDA     approved for NASAL specimens     only), is one component of a     comprehensive MRSA colonization     surveillance program. It is not     intended to diagnose MRSA     infection nor to guide or     monitor treatment for     MRSA infections.     Labs: Basic Metabolic Panel:  Recent Labs Lab 07/28/12 2207 07/29/12 0637  NA 123* 136  K 5.0 4.1  CL 89* 102  CO2 19 22  GLUCOSE 847* 178*  BUN 10 9  CREATININE 0.80 0.73  CALCIUM 9.5 8.8   Liver Function Tests:  Recent Labs Lab 07/28/12 2207  AST 102*  ALT 218*  ALKPHOS 507*  BILITOT 1.4*  PROT 8.0  ALBUMIN 2.7*   CBC:  Recent Labs Lab 07/28/12 2207 07/29/12 0637  WBC 7.3 8.5  NEUTROABS 5.8  --   HGB 13.1 12.0*  HCT 39.5 34.1*  MCV 90.4 86.8  PLT 533* 522*   CBG:  Recent Labs Lab 07/29/12 1406 07/29/12 1652 07/29/12 2137 07/30/12 0803 07/30/12 1131  GLUCAP 132* 143* 318* 237* 274*    Signed:  Micahel Omlor  [facility] 07/30/2012, 2:32 PM

## 2012-07-31 NOTE — Clinical Social Work Psychosocial (Signed)
Clinical Social Work Department BRIEF PSYCHOSOCIAL ASSESSMENT 07/31/2012  Patient:  David Wyatt, David Wyatt     Account Number:  [id number]     Admit date:  07/28/2012  Clinical Social Worker:  Delmer Islam  Date/Time:  07/31/2012 07:47 AM  Referred by:  Physician  Date Referred:  07/30/2012 Referred for  Substance Abuse   Other Referral:   Interview type:  Patient Other interview type:    PSYCHOSOCIAL DATA Living Status:  FRIEND(S) Admitted from facility:   Level of care:   Primary support name:   Primary support relationship to patient:   Degree of support available:   07/30/12: Patient stated that he is homeless, but has recently stayed with a friend in his home. Patient was not sure where he was going at discharge.  ** Patient talked about a pastor whio is very supportive of him. Patient also mentioned that his mother will be very pleased with his decision to give up drugs.    CURRENT CONCERNS Current Concerns  Substance Abuse   Other Concerns:    SOCIAL WORK ASSESSMENT / PLAN On 07/30/12 CSW introduced self and stated purpose of visit. Patient explained that he had used drugs for 29 years and was finally tired of using. Patient reported that he then got physically sick and was diagnosed with diabetes. Patient expressed desire for inpatient rehab facility as he is homeless and wants a place that can help him stay clean and where he can eat as he should due to the diabetes.    CSW gave patient information about [facility] inpatient rehab facility and advised patient that he must call facility to get things started.  Also gave patient two other outpatient resources.   Assessment/plan status:  No Further Intervention Required Other assessment/ plan:   Information/referral to community resources:   Three substance abuse resources    PATIENT'S/FAMILY'S RESPONSE TO PLAN OF CARE: Patient was expecting CSW and very open and communicative regarding his life and drug use. Patient very  expressive in letting CSW that he is finally tired of using and is confident that he can stay clean. CSW signing off as patient discharging from hospital 07/30/12.

## 2012-08-17 ENCOUNTER — Ambulatory Visit: Payer: No Typology Code available for payment source

## 2012-08-29 ENCOUNTER — Encounter (HOSPITAL_BASED_OUTPATIENT_CLINIC_OR_DEPARTMENT_OTHER): Payer: Self-pay | Admitting: *Deleted

## 2012-08-29 ENCOUNTER — Emergency Department (HOSPITAL_BASED_OUTPATIENT_CLINIC_OR_DEPARTMENT_OTHER)
Admission: EM | Admit: 2012-08-29 | Discharge: 2012-08-29 | Disposition: A | Discharge status: Home / Self Care | Payer: No Typology Code available for payment source | Attending: Emergency Medicine | Admitting: Emergency Medicine

## 2012-08-29 DIAGNOSIS — H539 Unspecified visual disturbance: Secondary | ICD-10-CM | POA: Insufficient documentation

## 2012-08-29 DIAGNOSIS — F17200 Nicotine dependence, unspecified, uncomplicated: Secondary | ICD-10-CM | POA: Insufficient documentation

## 2012-08-29 DIAGNOSIS — F1410 Cocaine abuse, uncomplicated: Secondary | ICD-10-CM | POA: Insufficient documentation

## 2012-08-29 DIAGNOSIS — Z8739 Personal history of other diseases of the musculoskeletal system and connective tissue: Secondary | ICD-10-CM | POA: Insufficient documentation

## 2012-08-29 DIAGNOSIS — E119 Type 2 diabetes mellitus without complications: Secondary | ICD-10-CM | POA: Insufficient documentation

## 2012-08-29 DIAGNOSIS — Z79899 Other long term (current) drug therapy: Secondary | ICD-10-CM | POA: Insufficient documentation

## 2012-08-29 DIAGNOSIS — H6122 Impacted cerumen, left ear: Secondary | ICD-10-CM

## 2012-08-29 DIAGNOSIS — I10 Essential (primary) hypertension: Secondary | ICD-10-CM

## 2012-08-29 DIAGNOSIS — H6120 Impacted cerumen, unspecified ear: Secondary | ICD-10-CM | POA: Insufficient documentation

## 2012-08-29 LAB — GLUCOSE, CAPILLARY: Glucose-Capillary: 222 mg/dL — ABNORMAL HIGH (ref 70–99)

## 2012-08-29 MED ORDER — HYDROCHLOROTHIAZIDE 25 MG PO TABS: 25.0000 mg | ORAL_TABLET | Freq: Every day | ORAL | Status: DC

## 2012-08-29 MED ORDER — CARBAMIDE PEROXIDE 6.5 % OT SOLN: 5.0000 [drp] | Freq: Two times a day (BID) | OTIC | Status: DC

## 2012-08-29 MED ORDER — LISINOPRIL 10 MG PO TABS: 20.0000 mg | ORAL_TABLET | Freq: Every day | ORAL | Status: DC

## 2012-08-29 NOTE — ED Notes (Signed)
Pt from [facility]- detoxing from crack cocaine- last use 1 week ago- reports b/p has been elevated- also reports trouble with his vision

## 2012-08-29 NOTE — ED Notes (Signed)
Pt states per [facility], his BP has been over 200 and that he has been having blurred vision off and on.  Pt also c/o loose tooth pain from bottom incisor.

## 2012-08-29 NOTE — ED Provider Notes (Signed)
History    This chart was scribed for Osvaldo Human, MD by Quintella Reichert, ED scribe.  This patient was seen in room MH07/MH07 and the patient's care was started at 5:45 PM.   CSN: [id number]  Arrival date & time 08/29/12  1650      Chief Complaint  Patient presents with  . Hypertension     The history is provided by the patient. No language interpreter was used.    HPI Comments: David Wyatt is a 52 y.o. male with h/o HTN, long-term crack cocaine abuse and DM sent from [facility] to the Emergency Department complaining of elevated BP that began today.  Pt has been at [facility] for detox from crack cocaine.  He last used cocaine one week ago.  On admission his BP is 151/90.  He normally medicates with lisinopril 20 mg tablets (1x/day) for his HTN.  Pt also complains of difficulty seeing, a feeling of a foreign body in his left ear, and intermittent locking up of his hands and legs.  He denies leg swelling, fever, sore throat, SOB, CP, abdominal pain, dysuria, rash, weakness, numbness, or any other associated symptoms.   Past Medical History  Diagnosis Date  . Hypertension   . Cocaine abuse   . Type II diabetes mellitus   . Arthritis     "hands" (07/28/2012)  . Angioedema of lips 07/28/2012    left upper (07/29/2012)    Past Surgical History  Procedure Laterality Date  . Inguinal hernia repair  04/01/2012    Procedure: HERNIA REPAIR INGUINAL ADULT;  Surgeon: Currie Paris, MD;  Location: [facility] OR;  Service: General;  Laterality: Right;  . Hernia repair Right 04/01/2012  . Incision and drainage of wound      boil on back/notes 07/14/2008 (07/29/2012)    Family History  Problem Relation Age of Onset  . Diabetes Father     History  Substance Use Topics  . Smoking status: Current Every Day Smoker -- 0.25 packs/day for 30 years    Types: Cigarettes  . Smokeless tobacco: Never Used  . Alcohol Use: No      Review of Systems  Constitutional: Negative  for fever.       High BP  HENT: Negative for sore throat.   Eyes: Positive for visual disturbance.  Respiratory: Negative for shortness of breath.   Cardiovascular: Negative for chest pain and leg swelling.  Gastrointestinal: Negative for abdominal pain.  Genitourinary: Negative for dysuria.  Skin: Negative for rash.  Neurological: Negative for weakness and numbness.  All other systems reviewed and are negative.    Allergies  Review of patient's allergies indicates no known allergies.  Home Medications   Current Outpatient Rx  Name  Route  Sig  Dispense  Refill  . glipiZIDE (GLUCOTROL) 5 MG tablet   Oral   Take 1 tablet (5 mg total) by mouth 2 (two) times daily before a meal.   60 tablet   1   . glucose blood (FREESTYLE LITE) test strip      Use as instructed   100 each   12   . glucose monitoring kit (FREESTYLE) monitoring kit   Does not apply   1 each by Does not apply route as needed for other.   1 each   0   . Lancets (FREESTYLE) lancets      Use as instructed   100 each   12   . lisinopril (PRINIVIL,ZESTRIL) 20 MG tablet  Oral   Take 20 mg by mouth daily.         . metFORMIN (GLUCOPHAGE) 500 MG tablet   Oral   Take 1 tablet (500 mg total) by mouth 2 (two) times daily with a meal.   60 tablet   1   . isosorbide-hydrALAZINE (BIDIL) 20-37.5 MG per tablet   Oral   Take 1 tablet by mouth 2 (two) times daily.   60 tablet   1   . methocarbamol (ROBAXIN) 500 MG tablet   Oral   Take 1 tablet (500 mg total) by mouth every 8 (eight) hours as needed (shoulder pain).   45 tablet   0   . oxyCODONE (OXY IR/ROXICODONE) 5 MG immediate release tablet   Oral   Take 1 tablet (5 mg total) by mouth every 4 (four) hours as needed.   30 tablet   0     BP 151/90  Pulse 77  Temp(Src) 98.6 F (37 C) (Oral)  Resp 20  Ht 5\' 8"  (1.727 m)  Wt 160 lb (72.576 kg)  BMI 24.33 kg/m2  SpO2 100%  Physical Exam  Nursing note and vitals reviewed. Constitutional:  He is oriented to person, place, and time. He appears well-developed and well-nourished. No distress.  HENT:  Head: Normocephalic and atraumatic.  Right Ear: External ear normal.  Left Ear: External ear normal.  Nose: Nose normal.  Mouth/Throat: Oropharynx is clear and moist. No oropharyngeal exudate.  Cerumen in left ear  Eyes: Conjunctivae and EOM are normal. Pupils are equal, round, and reactive to light.  Neck: Normal range of motion. Neck supple. No tracheal deviation present.  Cardiovascular: Normal rate.   Pulmonary/Chest: Effort normal. No respiratory distress.  Musculoskeletal: Normal range of motion.  Lymphadenopathy:    He has no cervical adenopathy.  Neurological: He is alert and oriented to person, place, and time.  Skin: Skin is warm and dry.  Psychiatric: He has a normal mood and affect. His behavior is normal.    ED Course  Procedures (including critical care time)  DIAGNOSTIC STUDIES: Oxygen Saturation is 100% on room air, normal by my interpretation.    COORDINATION OF CARE: 5:57 PM-Discussed treatment plan which includes ear drops for cerumen in pt's ear, and increasing pt's HTN medication dosage with pt at bedside and pt agreed to plan.   Rx lisinpril 20 mg qd and HCTZ 25 mg qd for hypertension.  Rx Debrox ear drops for cerumen in left ear.  Results for orders placed during the hospital encounter of 08/29/12  GLUCOSE, CAPILLARY      Result Value Range   Glucose-Capillary 222 (*) 70 - 99 mg/dL     1. Hypertension   2. Cerumen impaction, left      I personally performed the services described in this documentation, which was scribed in my presence. The recorded information has been reviewed and is accurate.  Osvaldo Human, MD      Carleene Cooper III, MD 08/29/12 [phone]

## 2012-08-29 NOTE — ED Notes (Signed)
Dr Ignacia Palma in room with patient now.

## 2012-09-02 ENCOUNTER — Emergency Department (HOSPITAL_BASED_OUTPATIENT_CLINIC_OR_DEPARTMENT_OTHER)
Admission: EM | Admit: 2012-09-02 | Discharge: 2012-09-02 | Disposition: A | Discharge status: Home / Self Care | Payer: No Typology Code available for payment source | Attending: Emergency Medicine | Admitting: Emergency Medicine

## 2012-09-02 ENCOUNTER — Encounter (HOSPITAL_BASED_OUTPATIENT_CLINIC_OR_DEPARTMENT_OTHER): Payer: Self-pay | Admitting: Family Medicine

## 2012-09-02 DIAGNOSIS — K029 Dental caries, unspecified: Secondary | ICD-10-CM

## 2012-09-02 DIAGNOSIS — Z8739 Personal history of other diseases of the musculoskeletal system and connective tissue: Secondary | ICD-10-CM | POA: Insufficient documentation

## 2012-09-02 DIAGNOSIS — Z79899 Other long term (current) drug therapy: Secondary | ICD-10-CM | POA: Insufficient documentation

## 2012-09-02 DIAGNOSIS — I10 Essential (primary) hypertension: Secondary | ICD-10-CM | POA: Insufficient documentation

## 2012-09-02 DIAGNOSIS — F17200 Nicotine dependence, unspecified, uncomplicated: Secondary | ICD-10-CM | POA: Insufficient documentation

## 2012-09-02 DIAGNOSIS — E119 Type 2 diabetes mellitus without complications: Secondary | ICD-10-CM | POA: Insufficient documentation

## 2012-09-02 MED ORDER — ACETAMINOPHEN 325 MG PO TABS: 650.0000 mg | ORAL_TABLET | Freq: Four times a day (QID) | ORAL | Status: DC | PRN

## 2012-09-02 NOTE — ED Notes (Signed)
Pt here from [facility]. Pt c/o chronic dental issue.

## 2012-09-02 NOTE — ED Provider Notes (Signed)
History    CSN: [id number] Arrival date & time 09/02/12  4696  First MD Initiated Contact with Patient 09/02/12 [phone]     Chief Complaint  Patient presents with  . Dental Problem   (Consider location/radiation/quality/duration/timing/severity/associated sxs/prior Treatment) HPI PT sent to the ED from [facility] for evaluation of tongue irritation from rubbing on his single remaining tooth. The tooth itself is not bothering him. He does not have any fever or swelling. He reports when he eats or moves his tongue it feels like 'a needle' on the side of his tongue.   Past Medical History  Diagnosis Date  . Hypertension   . Cocaine abuse   . Type II diabetes mellitus   . Arthritis     "hands" (07/28/2012)  . Angioedema of lips 07/28/2012    left upper (07/29/2012)   Past Surgical History  Procedure Laterality Date  . Inguinal hernia repair  04/01/2012    Procedure: HERNIA REPAIR INGUINAL ADULT;  Surgeon: Currie Paris, MD;  Location: [facility] OR;  Service: General;  Laterality: Right;  . Hernia repair Right 04/01/2012  . Incision and drainage of wound      boil on back/notes 07/14/2008 (07/29/2012)   Family History  Problem Relation Age of Onset  . Diabetes Father    History  Substance Use Topics  . Smoking status: Current Every Day Smoker -- 0.25 packs/day for 30 years    Types: Cigarettes  . Smokeless tobacco: Never Used  . Alcohol Use: No    Review of Systems All other systems reviewed and are negative except as noted in HPI.   Allergies  Review of patient's allergies indicates no known allergies.  Home Medications   Current Outpatient Rx  Name  Route  Sig  Dispense  Refill  . carbamide peroxide (DEBROX) 6.5 % otic solution   Left Ear   Place 5 drops into the left ear 2 (two) times daily.   15 mL   0   . glipiZIDE (GLUCOTROL) 5 MG tablet   Oral   Take 1 tablet (5 mg total) by mouth 2 (two) times daily before a meal.   60 tablet   1   . glucose blood (FREESTYLE  LITE) test strip      Use as instructed   100 each   12   . glucose monitoring kit (FREESTYLE) monitoring kit   Does not apply   1 each by Does not apply route as needed for other.   1 each   0   . hydrochlorothiazide (HYDRODIURIL) 25 MG tablet   Oral   Take 1 tablet (25 mg total) by mouth daily.   30 tablet   0   . isosorbide-hydrALAZINE (BIDIL) 20-37.5 MG per tablet   Oral   Take 1 tablet by mouth 2 (two) times daily.   60 tablet   1   . Lancets (FREESTYLE) lancets      Use as instructed   100 each   12   . lisinopril (PRINIVIL,ZESTRIL) 10 MG tablet   Oral   Take 2 tablets (20 mg total) by mouth daily.   30 tablet   0   . lisinopril (PRINIVIL,ZESTRIL) 20 MG tablet   Oral   Take 20 mg by mouth daily.         . metFORMIN (GLUCOPHAGE) 500 MG tablet   Oral   Take 1 tablet (500 mg total) by mouth 2 (two) times daily with a meal.   60 tablet  1   . methocarbamol (ROBAXIN) 500 MG tablet   Oral   Take 1 tablet (500 mg total) by mouth every 8 (eight) hours as needed (shoulder pain).   45 tablet   0   . oxyCODONE (OXY IR/ROXICODONE) 5 MG immediate release tablet   Oral   Take 1 tablet (5 mg total) by mouth every 4 (four) hours as needed.   30 tablet   0    BP 129/94  Pulse 103  Temp(Src) 98.1 F (36.7 C) (Oral)  Resp 20  SpO2 99% Physical Exam  Constitutional: He is oriented to person, place, and time. He appears well-developed and well-nourished.  HENT:  Head: Normocephalic and atraumatic.  Poor dentition with a single remaining L lower canine tooth; there is some mild erythema to the L lateral tongue but no ulceration or signs of infection  Neck: Neck supple.  Pulmonary/Chest: Effort normal.  Neurological: He is alert and oriented to person, place, and time. No cranial nerve deficit.  Psychiatric: He has a normal mood and affect. His behavior is normal.    ED Course  Procedures (including critical care time) Labs Reviewed - No data to  display No results found.  1. Dental caries     MDM  Pt advised that he will need to see a dentist because we do not do tooth extractions in the ED. Advised he could get dental wax to place over the sharp area on the tooth when eating. This is not an acute problem for him.    Charles B. Bernette Mayers, MD 09/02/12 [phone]

## 2012-09-03 ENCOUNTER — Telehealth (HOSPITAL_COMMUNITY): Payer: Self-pay | Admitting: Emergency Medicine

## 2012-10-11 ENCOUNTER — Emergency Department (HOSPITAL_BASED_OUTPATIENT_CLINIC_OR_DEPARTMENT_OTHER)
Admission: EM | Admit: 2012-10-11 | Discharge: 2012-10-12 | Disposition: A | Discharge status: Home / Self Care | Payer: Self-pay | Attending: Emergency Medicine | Admitting: Emergency Medicine

## 2012-10-11 ENCOUNTER — Encounter (HOSPITAL_BASED_OUTPATIENT_CLINIC_OR_DEPARTMENT_OTHER): Payer: Self-pay | Admitting: *Deleted

## 2012-10-11 DIAGNOSIS — F17200 Nicotine dependence, unspecified, uncomplicated: Secondary | ICD-10-CM | POA: Insufficient documentation

## 2012-10-11 DIAGNOSIS — H538 Other visual disturbances: Secondary | ICD-10-CM | POA: Insufficient documentation

## 2012-10-11 DIAGNOSIS — Z79899 Other long term (current) drug therapy: Secondary | ICD-10-CM | POA: Insufficient documentation

## 2012-10-11 DIAGNOSIS — I10 Essential (primary) hypertension: Secondary | ICD-10-CM | POA: Insufficient documentation

## 2012-10-11 DIAGNOSIS — Z8739 Personal history of other diseases of the musculoskeletal system and connective tissue: Secondary | ICD-10-CM | POA: Insufficient documentation

## 2012-10-11 DIAGNOSIS — E1169 Type 2 diabetes mellitus with other specified complication: Secondary | ICD-10-CM | POA: Insufficient documentation

## 2012-10-11 DIAGNOSIS — R739 Hyperglycemia, unspecified: Secondary | ICD-10-CM

## 2012-10-11 MED ORDER — METFORMIN HCL 500 MG PO TABS
500.0000 mg | ORAL_TABLET | Freq: Once | ORAL | Status: AC
  Administered 2012-10-12: 500 mg via ORAL
  Filled 2012-10-11: qty 1

## 2012-10-11 NOTE — ED Notes (Addendum)
Pt concerned that his blood sugar has been elevated today. 300 was the highest. C/o blurred vision. Takes metformin. Concerned that his pills may have been out of date.   Also takes Glucotrol for his blood sugar as well. Pt resides at [facility]

## 2012-10-11 NOTE — ED Provider Notes (Signed)
CSN: [id number]     Arrival date & time 10/11/12  2315 History  This chart was scribed for David Knutzen Smitty Cords, MD by Ardelia Mems, ED Scribe. This patient was seen in room MH07/MH07 and the patient's care was started at 11:44 PM.   First MD Initiated Contact with Patient 10/11/12 2340     Chief Complaint  Patient presents with  . hyperglycemia      Patient is a 52 y.o. male presenting with hyperglycemia. The history is provided by the patient. No language interpreter was used.  Hyperglycemia Blood sugar level PTA:  300 Severity:  Moderate Duration:  1 day Timing:  Constant Progression:  Worsening Chronicity:  New Diabetes status:  Controlled with oral medications Associated symptoms: no abdominal pain, no altered mental status, no blurred vision, no chest pain, no confusion, no dizziness, no fever, no nausea, no shortness of breath and no vomiting    HPI Comments: David Wyatt is a 52 y.o. male who presents to the Emergency Department complaining of hyperglycemia. Pt states that he was sent here from [facility]. He states that he has been taking metformin as prescribed, as well as Glucotrol, and his sugar remained elevated at around 300 despite this all day. He reports some blurred vision today. He states that he is a type II diabetic. He states that he has been told that he ate a lot of carbs today- toast with breakfast, 3 corn dogs at lunch and a tortilla at dinner. He states that he does not know what carbs are. Pt is a current every day smoker of 0.25 packs/day of 30 years. He denies fever, chill, nausea, vomiting, headaches, rash or any other symptoms.  Past Medical History  Diagnosis Date  . Hypertension   . Cocaine abuse   . Type II diabetes mellitus   . Arthritis     "hands" (07/28/2012)  . Angioedema of lips 07/28/2012    left upper (07/29/2012)   Past Surgical History  Procedure Laterality Date  . Inguinal hernia repair  04/01/2012    Procedure: HERNIA REPAIR INGUINAL  ADULT;  Surgeon: Currie Paris, MD;  Location: [facility] OR;  Service: General;  Laterality: Right;  . Hernia repair Right 04/01/2012  . Incision and drainage of wound      boil on back/notes 07/14/2008 (07/29/2012)   Family History  Problem Relation Age of Onset  . Diabetes Father    History  Substance Use Topics  . Smoking status: Current Every Day Smoker -- 0.25 packs/day for 30 years    Types: Cigarettes  . Smokeless tobacco: Never Used  . Alcohol Use: No    Review of Systems  Constitutional: Negative for fever and chills.  Eyes: Negative for blurred vision and visual disturbance.  Respiratory: Negative for shortness of breath.   Cardiovascular: Negative for chest pain.  Gastrointestinal: Negative for nausea, vomiting and abdominal pain.  Skin: Negative for rash.  Neurological: Negative for dizziness and headaches.  Psychiatric/Behavioral: Negative for confusion and altered mental status.  All other systems reviewed and are negative.    Allergies  Review of patient's allergies indicates no known allergies.  Home Medications   Current Outpatient Rx  Name  Route  Sig  Dispense  Refill  . omeprazole (PRILOSEC) 20 MG capsule   Oral   Take 20 mg by mouth daily.         Marland Kitchen acetaminophen (TYLENOL) 325 MG tablet   Oral   Take 2 tablets (650 mg total) by mouth every  6 (six) hours as needed for pain.   30 tablet   0   . carbamide peroxide (DEBROX) 6.5 % otic solution   Left Ear   Place 5 drops into the left ear 2 (two) times daily.   15 mL   0   . glipiZIDE (GLUCOTROL) 5 MG tablet   Oral   Take 1 tablet (5 mg total) by mouth 2 (two) times daily before a meal.   60 tablet   1   . glucose blood (FREESTYLE LITE) test strip      Use as instructed   100 each   12   . glucose monitoring kit (FREESTYLE) monitoring kit   Does not apply   1 each by Does not apply route as needed for other.   1 each   0   . hydrochlorothiazide (HYDRODIURIL) 25 MG tablet   Oral    Take 1 tablet (25 mg total) by mouth daily.   30 tablet   0   . isosorbide-hydrALAZINE (BIDIL) 20-37.5 MG per tablet   Oral   Take 1 tablet by mouth 2 (two) times daily.   60 tablet   1   . Lancets (FREESTYLE) lancets      Use as instructed   100 each   12   . lisinopril (PRINIVIL,ZESTRIL) 10 MG tablet   Oral   Take 2 tablets (20 mg total) by mouth daily.   30 tablet   0   . lisinopril (PRINIVIL,ZESTRIL) 20 MG tablet   Oral   Take 20 mg by mouth daily.         . metFORMIN (GLUCOPHAGE) 500 MG tablet   Oral   Take 1 tablet (500 mg total) by mouth 2 (two) times daily with a meal.   60 tablet   1   . methocarbamol (ROBAXIN) 500 MG tablet   Oral   Take 1 tablet (500 mg total) by mouth every 8 (eight) hours as needed (shoulder pain).   45 tablet   0   . oxyCODONE (OXY IR/ROXICODONE) 5 MG immediate release tablet   Oral   Take 1 tablet (5 mg total) by mouth every 4 (four) hours as needed.   30 tablet   0    Triage Vitals: BP 124/70  Pulse 88  Temp(Src) 98.2 F (36.8 C)  Resp 18  Ht 5\' 8"  (1.727 m)  Wt 187 lb 9 oz (85.078 kg)  BMI 28.53 kg/m2  SpO2 98%  Physical Exam  Nursing note and vitals reviewed. Constitutional: He is oriented to person, place, and time. He appears well-developed and well-nourished.  HENT:  Head: Normocephalic and atraumatic.  Mouth/Throat: Oropharynx is clear and moist.  Eyes: EOM are normal. Pupils are equal, round, and reactive to light.  Neck: Normal range of motion. Neck supple. No tracheal deviation present.  Cardiovascular: Normal rate, regular rhythm and normal heart sounds.   Pulmonary/Chest: Effort normal and breath sounds normal. No respiratory distress.  Abdominal: Soft. Bowel sounds are normal. There is no tenderness.  Musculoskeletal: Normal range of motion. He exhibits no tenderness.  Lymphadenopathy:    He has no cervical adenopathy.  Neurological: He is alert and oriented to person, place, and time. He has normal  reflexes.  Skin: Skin is warm and dry. No rash noted.  Psychiatric: He has a normal mood and affect.    ED Course   Medications  metFORMIN (GLUCOPHAGE) tablet 500 mg (not administered)   Procedures (including critical care time)  DIAGNOSTIC  STUDIES: Oxygen Saturation is 98% on RA, normal by my interpretation.    COORDINATION OF CARE: 11:48 PM- Pt advised of plan for treatment and pt agrees.   Labs Reviewed  URINALYSIS, ROUTINE W REFLEX MICROSCOPIC  BASIC METABOLIC PANEL   No results found.  No diagnosis found.  MDM  Suspect hyperglycemia secondary to dietary indiscretion.  Is eating more carbs than usual at [facility] have advised carb modified diet.  If sugars persist increase metformin to 850 mg BID   I personally performed the services described in this documentation, which was scribed in my presence. The recorded information has been reviewed and is accurate.     Jasmine Awe, MD 10/12/12 [phone]

## 2012-10-12 LAB — URINALYSIS, ROUTINE W REFLEX MICROSCOPIC
Bilirubin Urine: NEGATIVE
Leukocytes, UA: NEGATIVE
Nitrite: NEGATIVE
Specific Gravity, Urine: 1.015 (ref 1.005–1.030)
pH: 5.5 (ref 5.0–8.0)

## 2012-10-12 LAB — BASIC METABOLIC PANEL
Chloride: 100 mEq/L (ref 96–112)
GFR calc Af Amer: >90 mL/min (ref >90)
Potassium: 4 mEq/L (ref 3.5–5.1)
Sodium: 137 mEq/L (ref 135–145)

## 2012-10-12 MED ORDER — METFORMIN HCL 850 MG PO TABS: 850.0000 mg | ORAL_TABLET | Freq: Two times a day (BID) | ORAL | Status: DC

## 2012-12-13 ENCOUNTER — Emergency Department (HOSPITAL_COMMUNITY): Payer: No Typology Code available for payment source | Admitting: Anesthesiology

## 2012-12-13 ENCOUNTER — Encounter (HOSPITAL_COMMUNITY)
Admission: EM | Disposition: A | Discharge status: Home / Self Care | Payer: Self-pay | Source: Home / Self Care | Attending: Emergency Medicine

## 2012-12-13 ENCOUNTER — Encounter (HOSPITAL_COMMUNITY): Payer: Self-pay | Admitting: Anesthesiology

## 2012-12-13 ENCOUNTER — Encounter (HOSPITAL_COMMUNITY): Payer: Self-pay | Admitting: *Deleted

## 2012-12-13 ENCOUNTER — Ambulatory Visit (HOSPITAL_COMMUNITY)
Admission: EM | Admit: 2012-12-13 | Discharge: 2012-12-14 | Disposition: A | Discharge status: Home / Self Care | Payer: Self-pay | Attending: Emergency Medicine | Admitting: Emergency Medicine

## 2012-12-13 ENCOUNTER — Emergency Department (HOSPITAL_COMMUNITY): Payer: No Typology Code available for payment source

## 2012-12-13 DIAGNOSIS — IMO0002 Reserved for concepts with insufficient information to code with codable children: Secondary | ICD-10-CM | POA: Diagnosis present

## 2012-12-13 DIAGNOSIS — I891 Lymphangitis: Secondary | ICD-10-CM | POA: Insufficient documentation

## 2012-12-13 DIAGNOSIS — L02512 Cutaneous abscess of left hand: Secondary | ICD-10-CM

## 2012-12-13 DIAGNOSIS — F1410 Cocaine abuse, uncomplicated: Secondary | ICD-10-CM | POA: Insufficient documentation

## 2012-12-13 DIAGNOSIS — M79609 Pain in unspecified limb: Secondary | ICD-10-CM | POA: Insufficient documentation

## 2012-12-13 DIAGNOSIS — Z79899 Other long term (current) drug therapy: Secondary | ICD-10-CM | POA: Insufficient documentation

## 2012-12-13 DIAGNOSIS — R739 Hyperglycemia, unspecified: Secondary | ICD-10-CM

## 2012-12-13 DIAGNOSIS — L03022 Acute lymphangitis of left finger: Secondary | ICD-10-CM | POA: Diagnosis present

## 2012-12-13 DIAGNOSIS — I10 Essential (primary) hypertension: Secondary | ICD-10-CM | POA: Insufficient documentation

## 2012-12-13 DIAGNOSIS — IMO0001 Reserved for inherently not codable concepts without codable children: Secondary | ICD-10-CM | POA: Insufficient documentation

## 2012-12-13 DIAGNOSIS — E1165 Type 2 diabetes mellitus with hyperglycemia: Secondary | ICD-10-CM | POA: Diagnosis present

## 2012-12-13 HISTORY — PX: I & D EXTREMITY: SHX5045

## 2012-12-13 LAB — CBC WITH DIFFERENTIAL/PLATELET
Hemoglobin: 13.3 g/dL (ref 13.0–17.0)
Lymphs Abs: 1.7 10*3/uL (ref 0.7–4.0)
Monocytes Relative: 16 % — ABNORMAL HIGH (ref 3–12)
Neutro Abs: 2.2 10*3/uL (ref 1.7–7.7)
Neutrophils Relative %: 44 % (ref 43–77)
Platelets: 393 10*3/uL (ref 150–400)
RBC: 4.22 MIL/uL (ref 4.22–5.81)
WBC: 5 10*3/uL (ref 4.0–10.5)

## 2012-12-13 LAB — CREATININE, SERUM
Creatinine, Ser: 0.93 mg/dL (ref 0.50–1.35)
GFR calc Af Amer: >90 mL/min (ref >90)
GFR calc non Af Amer: >90 mL/min (ref >90)

## 2012-12-13 LAB — GLUCOSE, CAPILLARY
Glucose-Capillary: 231 mg/dL — ABNORMAL HIGH (ref 70–99)
Glucose-Capillary: 185 mg/dL — ABNORMAL HIGH (ref 70–99)

## 2012-12-13 SURGERY — IRRIGATION AND DEBRIDEMENT EXTREMITY
Anesthesia: General | Site: Thumb | Laterality: Left | Wound class: Contaminated

## 2012-12-13 MED ORDER — SULFAMETHOXAZOLE-TMP DS 800-160 MG PO TABS
1.0000 | ORAL_TABLET | Freq: Two times a day (BID) | ORAL | Status: DC
  Administered 2012-12-14 (×2): 1 via ORAL
  Filled 2012-12-13 (×3): qty 1

## 2012-12-13 MED ORDER — BUPIVACAINE HCL (PF) 0.25 % IJ SOLN
INTRAMUSCULAR | Status: DC | PRN
  Administered 2012-12-13: 10 mL

## 2012-12-13 MED ORDER — OXYCODONE-ACETAMINOPHEN 5-325 MG PO TABS: ORAL_TABLET | ORAL | Status: DC

## 2012-12-13 MED ORDER — GLIPIZIDE 5 MG PO TABS
5.0000 mg | ORAL_TABLET | Freq: Two times a day (BID) | ORAL | Status: DC
  Administered 2012-12-14: 5 mg via ORAL
  Filled 2012-12-13 (×3): qty 1

## 2012-12-13 MED ORDER — HYDROMORPHONE HCL PF 1 MG/ML IJ SOLN: 0.2500 mg | INTRAMUSCULAR | Status: DC | PRN

## 2012-12-13 MED ORDER — HYDROCHLOROTHIAZIDE 25 MG PO TABS
25.0000 mg | ORAL_TABLET | Freq: Every day | ORAL | Status: DC
  Administered 2012-12-13 – 2012-12-14 (×2): 25 mg via ORAL
  Filled 2012-12-13 (×2): qty 1

## 2012-12-13 MED ORDER — LACTATED RINGERS IV SOLN
INTRAVENOUS | Status: DC | PRN
  Administered 2012-12-13: 18:00:00 via INTRAVENOUS

## 2012-12-13 MED ORDER — ENOXAPARIN SODIUM 40 MG/0.4ML ~~LOC~~ SOLN
40.0000 mg | SUBCUTANEOUS | Status: DC
  Administered 2012-12-13: 40 mg via SUBCUTANEOUS
  Filled 2012-12-13 (×2): qty 0.4

## 2012-12-13 MED ORDER — OXYCODONE-ACETAMINOPHEN 5-325 MG PO TABS
2.0000 | ORAL_TABLET | Freq: Once | ORAL | Status: AC
  Administered 2012-12-13: 2 via ORAL
  Filled 2012-12-13: qty 2

## 2012-12-13 MED ORDER — VANCOMYCIN HCL IN DEXTROSE 1-5 GM/200ML-% IV SOLN
INTRAVENOUS | Status: AC
  Administered 2012-12-13: 1000 mg via INTRAVENOUS
  Filled 2012-12-13: qty 200

## 2012-12-13 MED ORDER — ATROPINE SULFATE 1 MG/ML IJ SOLN
INTRAMUSCULAR | Status: DC | PRN
  Administered 2012-12-13: .5 mg via INTRAVENOUS

## 2012-12-13 MED ORDER — ONDANSETRON HCL 4 MG/2ML IJ SOLN
INTRAMUSCULAR | Status: DC | PRN
  Administered 2012-12-13: 4 mg via INTRAVENOUS

## 2012-12-13 MED ORDER — ONDANSETRON HCL 4 MG PO TABS: 4.0000 mg | ORAL_TABLET | Freq: Four times a day (QID) | ORAL | Status: DC | PRN

## 2012-12-13 MED ORDER — FENTANYL CITRATE 0.05 MG/ML IJ SOLN
INTRAMUSCULAR | Status: DC | PRN
  Administered 2012-12-13: 100 ug via INTRAVENOUS
  Administered 2012-12-13: 50 ug via INTRAVENOUS

## 2012-12-13 MED ORDER — METFORMIN HCL 500 MG PO TABS
500.0000 mg | ORAL_TABLET | Freq: Two times a day (BID) | ORAL | Status: DC
  Administered 2012-12-14: 500 mg via ORAL
  Filled 2012-12-13 (×3): qty 1

## 2012-12-13 MED ORDER — METOCLOPRAMIDE HCL 5 MG/ML IJ SOLN
INTRAMUSCULAR | Status: DC | PRN
  Administered 2012-12-13: 10 mg via INTRAVENOUS

## 2012-12-13 MED ORDER — LISINOPRIL 20 MG PO TABS
20.0000 mg | ORAL_TABLET | Freq: Every day | ORAL | Status: DC
  Administered 2012-12-13 – 2012-12-14 (×2): 20 mg via ORAL
  Filled 2012-12-13 (×2): qty 1

## 2012-12-13 MED ORDER — OXYCODONE-ACETAMINOPHEN 5-325 MG PO TABS
1.0000 | ORAL_TABLET | ORAL | Status: DC | PRN
  Administered 2012-12-13 – 2012-12-14 (×3): 2 via ORAL
  Filled 2012-12-13 (×2): qty 2

## 2012-12-13 MED ORDER — SODIUM CHLORIDE 0.9 % IR SOLN
Status: DC | PRN
  Administered 2012-12-13: 1000 mL

## 2012-12-13 MED ORDER — PROPOFOL 10 MG/ML IV BOLUS
INTRAVENOUS | Status: DC | PRN
  Administered 2012-12-13: 200 mg via INTRAVENOUS

## 2012-12-13 MED ORDER — BUPIVACAINE HCL (PF) 0.25 % IJ SOLN
INTRAMUSCULAR | Status: AC
  Filled 2012-12-13: qty 30

## 2012-12-13 MED ORDER — OXYCODONE HCL 5 MG/5ML PO SOLN: 5.0000 mg | Freq: Once | ORAL | Status: DC | PRN

## 2012-12-13 MED ORDER — OXYCODONE HCL 5 MG PO TABS: 5.0000 mg | ORAL_TABLET | Freq: Once | ORAL | Status: DC | PRN

## 2012-12-13 MED ORDER — ISOSORB DINITRATE-HYDRALAZINE 20-37.5 MG PO TABS
1.0000 | ORAL_TABLET | Freq: Two times a day (BID) | ORAL | Status: DC
  Administered 2012-12-13 – 2012-12-14 (×2): 1 via ORAL
  Filled 2012-12-13 (×3): qty 1

## 2012-12-13 MED ORDER — METOCLOPRAMIDE HCL 5 MG/ML IJ SOLN: 5.0000 mg | Freq: Three times a day (TID) | INTRAMUSCULAR | Status: DC | PRN

## 2012-12-13 MED ORDER — SULFAMETHOXAZOLE-TRIMETHOPRIM 800-160 MG PO TABS: 1.0000 | ORAL_TABLET | Freq: Two times a day (BID) | ORAL | Status: DC

## 2012-12-13 MED ORDER — METOCLOPRAMIDE HCL 10 MG PO TABS: 5.0000 mg | ORAL_TABLET | Freq: Three times a day (TID) | ORAL | Status: DC | PRN

## 2012-12-13 MED ORDER — METHOCARBAMOL 500 MG PO TABS
ORAL_TABLET | ORAL | Status: AC
  Administered 2012-12-13: 500 mg via ORAL
  Filled 2012-12-13: qty 1

## 2012-12-13 MED ORDER — METOCLOPRAMIDE HCL 5 MG/ML IJ SOLN: 10.0000 mg | Freq: Once | INTRAMUSCULAR | Status: DC | PRN

## 2012-12-13 MED ORDER — TEMAZEPAM 15 MG PO CAPS: 15.0000 mg | ORAL_CAPSULE | Freq: Every evening | ORAL | Status: DC | PRN

## 2012-12-13 MED ORDER — SUCCINYLCHOLINE CHLORIDE 20 MG/ML IJ SOLN
INTRAMUSCULAR | Status: DC | PRN
  Administered 2012-12-13: 140 mg via INTRAVENOUS

## 2012-12-13 MED ORDER — OXYCODONE-ACETAMINOPHEN 5-325 MG PO TABS
ORAL_TABLET | ORAL | Status: AC
  Administered 2012-12-13: 2 via ORAL
  Filled 2012-12-13: qty 2

## 2012-12-13 MED ORDER — DIPHENHYDRAMINE HCL 12.5 MG/5ML PO ELIX: 12.5000 mg | ORAL_SOLUTION | ORAL | Status: DC | PRN

## 2012-12-13 MED ORDER — METHOCARBAMOL 500 MG PO TABS
500.0000 mg | ORAL_TABLET | Freq: Three times a day (TID) | ORAL | Status: DC | PRN
  Administered 2012-12-13 – 2012-12-14 (×2): 500 mg via ORAL
  Filled 2012-12-13 (×3): qty 1

## 2012-12-13 MED ORDER — ONDANSETRON HCL 4 MG/2ML IJ SOLN: 4.0000 mg | Freq: Four times a day (QID) | INTRAMUSCULAR | Status: DC | PRN

## 2012-12-13 MED ORDER — PANTOPRAZOLE SODIUM 40 MG PO TBEC
40.0000 mg | DELAYED_RELEASE_TABLET | Freq: Every day | ORAL | Status: DC
  Administered 2012-12-13 – 2012-12-14 (×2): 40 mg via ORAL
  Filled 2012-12-13 (×2): qty 1

## 2012-12-13 MED ORDER — TETANUS-DIPHTH-ACELL PERTUSSIS 5-2.5-18.5 LF-MCG/0.5 IM SUSP
0.5000 mL | Freq: Once | INTRAMUSCULAR | Status: AC
  Administered 2012-12-13: 0.5 mL via INTRAMUSCULAR
  Filled 2012-12-13: qty 0.5

## 2012-12-13 MED ORDER — LACTATED RINGERS IV SOLN
INTRAVENOUS | Status: DC
  Administered 2012-12-13: 20:00:00 via INTRAVENOUS

## 2012-12-13 SURGICAL SUPPLY — 53 items
BANDAGE COBAN STERILE 2 (GAUZE/BANDAGES/DRESSINGS) IMPLANT
BANDAGE CONFORM 2  STR LF (GAUZE/BANDAGES/DRESSINGS) ×1 IMPLANT
BANDAGE ELASTIC 3 VELCRO ST LF (GAUZE/BANDAGES/DRESSINGS) ×1 IMPLANT
BANDAGE ELASTIC 4 VELCRO ST LF (GAUZE/BANDAGES/DRESSINGS) ×1 IMPLANT
BANDAGE GAUZE 4  KLING STR (GAUZE/BANDAGES/DRESSINGS) ×1 IMPLANT
BANDAGE GAUZE ELAST BULKY 4 IN (GAUZE/BANDAGES/DRESSINGS) ×2 IMPLANT
BNDG CMPR 9X4 STRL LF SNTH (GAUZE/BANDAGES/DRESSINGS) ×1
BNDG COHESIVE 1X5 TAN STRL LF (GAUZE/BANDAGES/DRESSINGS) ×1 IMPLANT
BNDG ESMARK 4X9 LF (GAUZE/BANDAGES/DRESSINGS) ×1 IMPLANT
CLOTH BEACON ORANGE TIMEOUT ST (SAFETY) ×1 IMPLANT
CORDS BIPOLAR (ELECTRODE) ×1 IMPLANT
COVER SURGICAL LIGHT HANDLE (MISCELLANEOUS) ×2 IMPLANT
DECANTER SPIKE VIAL GLASS SM (MISCELLANEOUS) ×1 IMPLANT
DRAIN PENROSE 1/4X12 LTX STRL (WOUND CARE) IMPLANT
DRSG ADAPTIC 3X8 NADH LF (GAUZE/BANDAGES/DRESSINGS) IMPLANT
DRSG EMULSION OIL 3X3 NADH (GAUZE/BANDAGES/DRESSINGS) ×1 IMPLANT
DRSG PAD ABDOMINAL 8X10 ST (GAUZE/BANDAGES/DRESSINGS) ×3 IMPLANT
GAUZE XEROFORM 1X8 LF (GAUZE/BANDAGES/DRESSINGS) ×2 IMPLANT
GLOVE BIO SURGEON STRL SZ7.5 (GLOVE) ×2 IMPLANT
GLOVE BIOGEL PI IND STRL 8 (GLOVE) ×1 IMPLANT
GLOVE BIOGEL PI INDICATOR 8 (GLOVE) ×1
GOWN BRE IMP PREV XXLGXLNG (GOWN DISPOSABLE) ×2 IMPLANT
HANDPIECE INTERPULSE COAX TIP (DISPOSABLE)
KIT BASIN OR (CUSTOM PROCEDURE TRAY) ×2 IMPLANT
KIT ROOM TURNOVER OR (KITS) ×2 IMPLANT
LOOP VESSEL MAXI BLUE (MISCELLANEOUS) IMPLANT
LOOP VESSEL MINI RED (MISCELLANEOUS) IMPLANT
MANIFOLD NEPTUNE II (INSTRUMENTS) ×1 IMPLANT
NDL HYPO 25GX1X1/2 BEV (NEEDLE) IMPLANT
NDL HYPO 25X1 1.5 SAFETY (NEEDLE) IMPLANT
NEEDLE HYPO 25GX1X1/2 BEV (NEEDLE) ×2 IMPLANT
NEEDLE HYPO 25X1 1.5 SAFETY (NEEDLE) IMPLANT
NS IRRIG 1000ML POUR BTL (IV SOLUTION) ×2 IMPLANT
PACK ORTHO EXTREMITY (CUSTOM PROCEDURE TRAY) ×2 IMPLANT
PAD ARMBOARD 7.5X6 YLW CONV (MISCELLANEOUS) ×4 IMPLANT
SCRUB BETADINE 4OZ XXX (MISCELLANEOUS) ×2 IMPLANT
SET HNDPC FAN SPRY TIP SCT (DISPOSABLE) IMPLANT
SOLUTION BETADINE 4OZ (MISCELLANEOUS) ×2 IMPLANT
SPONGE GAUZE 4X4 12PLY (GAUZE/BANDAGES/DRESSINGS) ×2 IMPLANT
SPONGE LAP 18X18 X RAY DECT (DISPOSABLE) ×1 IMPLANT
SPONGE LAP 4X18 X RAY DECT (DISPOSABLE) ×1 IMPLANT
SUCTION FRAZIER TIP 10 FR DISP (SUCTIONS) ×1 IMPLANT
SUT ETHILON 4 0 PS 2 18 (SUTURE) ×2 IMPLANT
SUT MON AB 5-0 P3 18 (SUTURE) IMPLANT
SYR CONTROL 10ML LL (SYRINGE) ×1 IMPLANT
TOWEL OR 17X24 6PK STRL BLUE (TOWEL DISPOSABLE) ×2 IMPLANT
TOWEL OR 17X26 10 PK STRL BLUE (TOWEL DISPOSABLE) ×2 IMPLANT
TUBE ANAEROBIC SPECIMEN COL (MISCELLANEOUS) IMPLANT
TUBE CONNECTING 12X1/4 (SUCTIONS) ×2 IMPLANT
TUBE FEEDING 5FR 15 INCH (TUBING) IMPLANT
UNDERPAD 30X30 INCONTINENT (UNDERPADS AND DIAPERS) ×2 IMPLANT
WATER STERILE IRR 1000ML POUR (IV SOLUTION) ×1 IMPLANT
YANKAUER SUCT BULB TIP NO VENT (SUCTIONS) ×2 IMPLANT

## 2012-12-13 NOTE — ED Notes (Signed)
cbg reads 231

## 2012-12-13 NOTE — ED Provider Notes (Signed)
CSN: [id number]     Arrival date & time 12/13/12  1413 History   None    Chief Complaint  Patient presents with  . Hand Pain   (Consider location/radiation/quality/duration/timing/severity/associated sxs/prior Treatment) Patient is a 52 y.o. male presenting with hand pain. The history is provided by the patient. No language interpreter was used.  Hand Pain Pertinent negatives include no chills, fever or numbness.  This pt is a 52 year old male who presents with a sore thumb for the last two days. He reports that he doesn't remember how he injured it, but he thinks he stuck himself with a screw driver. He is worried that he has something in his thumb. He denies any fever or drainage. He also has been having some left sided leg pain that he describes as a shooting pain down his left leg with throbbing pain in his left buttock. He reports that this pain is mostly in the morning when he first gets up and starts walking.   Past Medical History  Diagnosis Date  . Hypertension   . Cocaine abuse   . Type II diabetes mellitus   . Arthritis     "hands" (07/28/2012)  . Angioedema of lips 07/28/2012    left upper (07/29/2012)   Past Surgical History  Procedure Laterality Date  . Inguinal hernia repair  04/01/2012    Procedure: HERNIA REPAIR INGUINAL ADULT;  Surgeon: Currie Paris, MD;  Location: [facility] OR;  Service: General;  Laterality: Right;  . Hernia repair Right 04/01/2012  . Incision and drainage of wound      boil on back/notes 07/14/2008 (07/29/2012)   Family History  Problem Relation Age of Onset  . Diabetes Father    History  Substance Use Topics  . Smoking status: Current Every Day Smoker -- 0.25 packs/day for 30 years    Types: Cigarettes  . Smokeless tobacco: Never Used  . Alcohol Use: No    Review of Systems  Constitutional: Negative for fever and chills.  Skin: Positive for wound.  Neurological: Negative for tremors and numbness.  All other systems reviewed and are  negative.    Allergies  Review of patient's allergies indicates no known allergies.  Home Medications   Current Outpatient Rx  Name  Route  Sig  Dispense  Refill  . acetaminophen (TYLENOL) 325 MG tablet   Oral   Take 2 tablets (650 mg total) by mouth every 6 (six) hours as needed for pain.   30 tablet   0   . carbamide peroxide (DEBROX) 6.5 % otic solution   Left Ear   Place 5 drops into the left ear 2 (two) times daily.   15 mL   0   . glipiZIDE (GLUCOTROL) 5 MG tablet   Oral   Take 1 tablet (5 mg total) by mouth 2 (two) times daily before a meal.   60 tablet   1   . glucose blood (FREESTYLE LITE) test strip      Use as instructed   100 each   12   . glucose monitoring kit (FREESTYLE) monitoring kit   Does not apply   1 each by Does not apply route as needed for other.   1 each   0   . hydrochlorothiazide (HYDRODIURIL) 25 MG tablet   Oral   Take 1 tablet (25 mg total) by mouth daily.   30 tablet   0   . isosorbide-hydrALAZINE (BIDIL) 20-37.5 MG per tablet   Oral  Take 1 tablet by mouth 2 (two) times daily.   60 tablet   1   . Lancets (FREESTYLE) lancets      Use as instructed   100 each   12   . lisinopril (PRINIVIL,ZESTRIL) 10 MG tablet   Oral   Take 2 tablets (20 mg total) by mouth daily.   30 tablet   0   . lisinopril (PRINIVIL,ZESTRIL) 20 MG tablet   Oral   Take 20 mg by mouth daily.         . metFORMIN (GLUCOPHAGE) 500 MG tablet   Oral   Take 1 tablet (500 mg total) by mouth 2 (two) times daily with a meal.   60 tablet   1   . metFORMIN (GLUCOPHAGE) 850 MG tablet   Oral   Take 1 tablet (850 mg total) by mouth 2 (two) times daily with a meal.   30 tablet   0   . methocarbamol (ROBAXIN) 500 MG tablet   Oral   Take 1 tablet (500 mg total) by mouth every 8 (eight) hours as needed (shoulder pain).   45 tablet   0   . omeprazole (PRILOSEC) 20 MG capsule   Oral   Take 20 mg by mouth daily.         Marland Kitchen oxyCODONE (OXY  IR/ROXICODONE) 5 MG immediate release tablet   Oral   Take 1 tablet (5 mg total) by mouth every 4 (four) hours as needed.   30 tablet   0    BP 149/86  Pulse 76  Temp(Src) 98.3 F (36.8 C) (Oral)  Resp 18  Wt 172 lb 9 oz (78.274 kg)  BMI 26.24 kg/m2  SpO2 96% Physical Exam  Vitals reviewed. Constitutional: He is oriented to person, place, and time. He appears well-developed and well-nourished. No distress.  HENT:  Head: Normocephalic and atraumatic.  Eyes: Pupils are equal, round, and reactive to light.  Neck: Normal range of motion. Neck supple.  Cardiovascular: Normal rate, regular rhythm, normal heart sounds and intact distal pulses.   Pulmonary/Chest: Effort normal and breath sounds normal.  Musculoskeletal: Normal range of motion.  Left thumb, edema and tenderness, good sensation. Brisk capillary refill.   Lymphadenopathy:    He has no cervical adenopathy.  Neurological: He is alert and oriented to person, place, and time.  Skin: Skin is warm and dry.  Psychiatric: He has a normal mood and affect. His behavior is normal. Judgment and thought content normal.    ED Course  Procedures (including critical care time) Labs Review Labs Reviewed  GLUCOSE, CAPILLARY - Abnormal; Notable for the following:    Glucose-Capillary 231 (*)    All other components within normal limits   Imaging Review No results found.  MDM   1. Felon, left     Seen by Dr. Merlyn Lot, hand specialist. To OR for I & D of left thumb, felon. Pt is in agreement with plan.      Irish Elders, NP 12/13/12 1720

## 2012-12-13 NOTE — Anesthesia Procedure Notes (Signed)
Procedure Name: Intubation Date/Time: 12/13/2012 5:44 PM Performed by: Alanda Amass A Pre-anesthesia Checklist: Patient identified, Timeout performed, Emergency Drugs available, Suction available and Patient being monitored Patient Re-evaluated:Patient Re-evaluated prior to inductionOxygen Delivery Method: Circle system utilized Preoxygenation: Pre-oxygenation with 100% oxygen Intubation Type: IV induction, Rapid sequence and Cricoid Pressure applied Laryngoscope Size: Mac and 3 Grade View: Grade I Tube type: Oral Tube size: 7.5 mm Number of attempts: 1 Airway Equipment and Method: Stylet Placement Confirmation: ETT inserted through vocal cords under direct vision,  breath sounds checked- equal and bilateral and positive ETCO2 Secured at: 21 cm Tube secured with: Tape Dental Injury: Teeth and Oropharynx as per pre-operative assessment

## 2012-12-13 NOTE — Op Note (Signed)
David Wyatt, David Wyatt NO.:  [id number]  MEDICAL RECORD NO.:  [id number]  LOCATION:  [id number]                        FACILITY:  [facility]  PHYSICIAN:  Betha Loa, MD        DATE OF BIRTH:  1961/01/01  DATE OF PROCEDURE:  12/13/2012 DATE OF DISCHARGE:                              OPERATIVE REPORT   PREOPERATIVE DIAGNOSIS:  Left thumb felon.  POSTOPERATIVE DIAGNOSIS:  Left thumb felon.  PROCEDURE:  Irrigation and debridement of left thumb felon and abscess.  SURGEON:  Betha Loa, MD  ASSISTANT:  None.  ANESTHESIA:  General.  IV FLUIDS:  Per anesthesia flow sheet.  ESTIMATED BLOOD LOSS:  Minimal.  COMPLICATIONS:  None.  SPECIMENS:  Cultures from left thumb to micro.  TOURNIQUET TIME:  13 minutes.  DISPOSITION:  Stable to PACU.  INDICATIONS:  David Wyatt is a 52 year old right-hand dominant male, who states he thinks he poked himself in the thumb with screwdriver few days ago.  He has had increasing pain and swelling in the thumb since.  He presented to the [facility] Emergency Department, where he was evaluated.  He was felt to have a left thumb infection.  I was consulted for management of the injury.  On evaluation, he had a tense and very tender pad of his left thumb.  He did not have visible wound.  There was no active drainage.  No proximal streaking.  It is not tender over the proximal phalanx.  I recommended David Wyatt, incision and drainage of the abscess.  Risks, benefits, and alternatives of surgery were discussed including risk of blood loss, infection, damage to nerves, vessels, tendons, ligaments, bone; failure of surgery; need for additional surgery, complications with wound healing, continued pain, continued infection, need for repeat irrigation and debridement.  He voiced understanding of these risks and elected to proceed.  OPERATIVE COURSE:  After being identified preoperatively by myself, the patient and I agreed upon procedure and site  of procedure.  Surgical site was marked.  Risks, benefits, and alternatives of surgery were reviewed and he wished to proceed.  Surgical consent had been signed. He was transferred to the operating room, placed on the operating table in supine position with left upper extremity on arm board.  General anesthesia was induced by anesthesiologist.  The left upper extremity was prepped and draped in normal sterile orthopedic fashion.  Surgical pause performed between surgeons, anesthesia, operating staff, and all were in agreement as to the patient, procedure, and site of procedure. Tourniquet at the proximal aspect of the extremity was inflated to 250 mmHg after gravity exsanguination of the hand and Esmarch exsanguination of the forearm.  Incision was made at the radial side of the distal phalanx of the thumb and carried into subcutaneous tissues by spreading technique.  All septae were spread.  There was thin edematous fluid expressed and no purulence.  The skin on the volar aspect of the thumb was then entered.  There was purulence underneath the epidermis.  This was cultured for aerobes and anaerobes.  All devitalized skin was excised sharply using the scissors.  The wounds were copiously irrigated with sterile saline.  Quarter-inch iodoform packing was  placed in the wound on the radial side of the thumb.  The wounds were then dressed with sterile Xeroform, 4x4s, and wrapped with a Kling bandage. Alumafoam splint was placed and wrapped with a Coban dressing lightly. Tourniquet deflated at 13 minutes.  Fingertips were pink with brisk capillary refill after deflation of tourniquet.  The operative drapes were broken down.  The patient was awoken from anesthesia safely.  He was transferred back to stretcher and taken to PACU in stable condition. He can be discharged home if he is able to obtain a ride home. Otherwise, he will be kept for extended recovery and allowed to go home tomorrow.  He  will be discharged on Percocet 5/325, 1-2 p.o. q.6 hours p.r.n. pain, dispensed #30 and Bactrim DS 1 p.o. b.i.d. x7 days.     Betha Loa, MD     KK/MEDQ  D:  12/13/2012  T:  12/13/2012  Job:  [id number]

## 2012-12-13 NOTE — Preoperative (Signed)
Beta Blockers   Reason not to administer Beta Blockers:Not on home beta blockers\

## 2012-12-13 NOTE — Anesthesia Preprocedure Evaluation (Addendum)
Anesthesia Evaluation  Patient identified by MRN, date of birth, ID band Patient awake    Reviewed: Allergy & Precautions, H&P , NPO status , Patient's Chart, lab work & pertinent test results, reviewed documented beta blocker date and time   Airway Mallampati: II TM Distance: >3 FB Neck ROM: full    Dental  (+) Edentulous Upper, Edentulous Lower and Dental Advisory Given   Pulmonary Current Smoker,  breath sounds clear to auscultation        Cardiovascular hypertension, On Medications and Pt. on medications Rhythm:regular     Neuro/Psych negative neurological ROS     GI/Hepatic negative GI ROS, (+)     substance abuse  cocaine use,   Endo/Other  diabetes, Poorly Controlled, Type 2, Oral Hypoglycemic Agents  Renal/GU negative Renal ROS  negative genitourinary   Musculoskeletal   Abdominal   Peds  Hematology negative hematology ROS (+)   Anesthesia Other Findings See surgeon's H&P   Reproductive/Obstetrics negative OB ROS                          Anesthesia Physical Anesthesia Plan  ASA: III and emergent  Anesthesia Plan: General   Post-op Pain Management:    Induction: Intravenous, Cricoid pressure planned and Rapid sequence  Airway Management Planned: Oral ETT  Additional Equipment:   Intra-op Plan:   Post-operative Plan: Extubation in OR  Informed Consent: I have reviewed the patients History and Physical, chart, labs and discussed the procedure including the risks, benefits and alternatives for the proposed anesthesia with the patient or authorized representative who has indicated his/her understanding and acceptance.   Dental Advisory Given  Plan Discussed with: CRNA and Surgeon  Anesthesia Plan Comments:         Anesthesia Quick Evaluation

## 2012-12-13 NOTE — H&P (Signed)
David Wyatt is an 52 y.o. male.   Chief Complaint: left thumb felon HPI: 52 yo rhd male states he thinks he poked left thumb with screw driver a few days ago.  Has since hand increased pain and swelling in left thumb.  No fevers, chills, night sweats.  Reports no previous injury to thumb and no other injury at this time.  Past Medical History  Diagnosis Date  . Hypertension   . Cocaine abuse   . Type II diabetes mellitus   . Arthritis     "hands" (07/28/2012)  . Angioedema of lips 07/28/2012    left upper (07/29/2012)    Past Surgical History  Procedure Laterality Date  . Inguinal hernia repair  04/01/2012    Procedure: HERNIA REPAIR INGUINAL ADULT;  Surgeon: Currie Paris, MD;  Location: [facility] OR;  Service: General;  Laterality: Right;  . Hernia repair Right 04/01/2012  . Incision and drainage of wound      boil on back/notes 07/14/2008 (07/29/2012)    Family History  Problem Relation Age of Onset  . Diabetes Father    Social History:  reports that he has been smoking Cigarettes.  He has a 7.5 pack-year smoking history. He has never used smokeless tobacco. He reports that he uses illicit drugs ("Crack" cocaine and Cocaine). He reports that he does not drink alcohol.  Allergies: No Known Allergies  Medications Prior to Admission  Medication Sig Dispense Refill  . acetaminophen (TYLENOL) 325 MG tablet Take 2 tablets (650 mg total) by mouth every 6 (six) hours as needed for pain.  30 tablet  0  . glipiZIDE (GLUCOTROL) 5 MG tablet Take 1 tablet (5 mg total) by mouth 2 (two) times daily before a meal.  60 tablet  1  . hydrochlorothiazide (HYDRODIURIL) 25 MG tablet Take 1 tablet (25 mg total) by mouth daily.  30 tablet  0  . isosorbide-hydrALAZINE (BIDIL) 20-37.5 MG per tablet Take 1 tablet by mouth 2 (two) times daily.  60 tablet  1  . Lancets (FREESTYLE) lancets Use as instructed  100 each  12  . lisinopril (PRINIVIL,ZESTRIL) 10 MG tablet Take 2 tablets (20 mg total) by mouth daily.   30 tablet  0  . metFORMIN (GLUCOPHAGE) 500 MG tablet Take 1 tablet (500 mg total) by mouth 2 (two) times daily with a meal.  60 tablet  1  . methocarbamol (ROBAXIN) 500 MG tablet Take 1 tablet (500 mg total) by mouth every 8 (eight) hours as needed (shoulder pain).  45 tablet  0  . omeprazole (PRILOSEC) 20 MG capsule Take 20 mg by mouth daily.        Results for orders placed during the hospital encounter of 12/13/12 (from the past 48 hour(s))  GLUCOSE, CAPILLARY     Status: Abnormal   Collection Time    12/13/12  2:27 PM      Result Value Range   Glucose-Capillary 231 (*) 70 - 99 mg/dL  CBC WITH DIFFERENTIAL     Status: Abnormal   Collection Time    12/13/12  4:25 PM      Result Value Range   WBC 5.0  4.0 - 10.5 K/uL   RBC 4.22  4.22 - 5.81 MIL/uL   Hemoglobin 13.3  13.0 - 17.0 g/dL   HCT 98.1  19.1 - 47.8 %   MCV 93.4  78.0 - 100.0 fL   MCH 31.5  26.0 - 34.0 pg   MCHC 33.8  30.0 - 36.0 g/dL  RDW 15.3  11.5 - 15.5 %   Platelets 393  150 - 400 K/uL   Neutrophils Relative % 44  43 - 77 %   Neutro Abs 2.2  1.7 - 7.7 K/uL   Lymphocytes Relative 35  12 - 46 %   Lymphs Abs 1.7  0.7 - 4.0 K/uL   Monocytes Relative 16 (*) 3 - 12 %   Monocytes Absolute 0.8  0.1 - 1.0 K/uL   Eosinophils Relative 5  0 - 5 %   Eosinophils Absolute 0.3  0.0 - 0.7 K/uL   Basophils Relative 0  0 - 1 %   Basophils Absolute 0.0  0.0 - 0.1 K/uL    Dg Hand Complete Left  12/13/2012   CLINICAL DATA:  Left thumb pain.  EXAM: LEFT HAND - COMPLETE 3+ VIEW  COMPARISON:  None.  FINDINGS: Minimal degenerative change over the 2nd and 3rd MCP joints. Degenerative change at the 1st MCP joint and 1st interphalangeal joint. No acute fracture or dislocation.  IMPRESSION: No acute findings.   Electronically Signed   By: Elberta Fortis M.D.   On: 12/13/2012 15:34     A comprehensive review of systems was negative.  Blood pressure 149/86, pulse 76, temperature 98.3 F (36.8 C), temperature source Oral, resp. rate 18,  weight 172 lb 9 oz (78.274 kg), SpO2 96.00%.  General appearance: alert, cooperative and appears stated age Head: Normocephalic, without obvious abnormality, atraumatic Neck: supple, symmetrical, trachea midline Resp: clear to auscultation bilaterally Cardio: regular rate and rhythm GI: non tender Extremities: intact sensation and capillary refill all digits.  +epl/fpl/io.  ttp left thumb pad.  pad tense and erythematous.  no proximal swelling.  can flex/extend ip joint with minimal pain.  no proximal streaking.  no tenderness over proximal phalanx. Pulses: 2+ and symmetric Skin: as above Neurologic: Grossly normal Incision/Wound: na  Assessment/Plan Left thumb felon.  Recommend I&D of left thumb abscess.  Risks, benefits, and alternatives of surgery were discussed and the patient agrees with the plan of care.   Jaselynn Tamas R 12/13/2012, 5:31 PM

## 2012-12-13 NOTE — ED Provider Notes (Signed)
Medical screening examination/treatment/procedure(s) were conducted as a shared visit with non-physician practitioner(s) and myself.  I personally evaluated the patient during the encounter  Here with 3 days of L thumb pain. Hit it with a screwdriver previously, no puncture wound seen. Patient has swelling, tenderness, mild erythema to distal phalanx. Non-toxic otherwise, exam benign. Concern for early felon. Ortho consulted and is taking patient to OR.  Dagmar Hait, MD 12/13/12 [phone]

## 2012-12-13 NOTE — Anesthesia Postprocedure Evaluation (Signed)
Anesthesia Post Note  Patient: David Wyatt  Procedure(s) Performed: Procedure(s) (LRB): IRRIGATION AND DEBRIDEMENT LEFT THUMB (Left)  Anesthesia type: General  Patient location: PACU  Post pain: Pain level controlled  Post assessment: Patient's Cardiovascular Status Stable  Last Vitals:  Filed Vitals:   12/13/12 1900  BP: 121/72  Pulse: 70  Temp:   Resp: 18    Post vital signs: Reviewed and stable  Level of consciousness: alert  Complications: No apparent anesthesia complications

## 2012-12-13 NOTE — Brief Op Note (Signed)
12/13/2012  6:15 PM  PATIENT:  David Wyatt  52 y.o. male  PRE-OPERATIVE DIAGNOSIS:  infected left thumb  POST-OPERATIVE DIAGNOSIS:  infected left thumb  PROCEDURE:  Procedure(s): IRRIGATION AND DEBRIDEMENT LEFT THUMB  SURGEON:  Surgeon(s): Tami Ribas, MD  PHYSICIAN ASSISTANT:   ASSISTANTS: none   ANESTHESIA:   general  EBL:     DRAINS: iodoform packing  LOCAL MEDICATIONS USED:  MARCAINE     SPECIMEN:  Source of Specimen:  left thumb  DISPOSITION OF SPECIMEN:  micro  COUNTS:  YES  TOURNIQUET:   Total Tourniquet Time Documented: Upper Arm (Left) - 13 minutes Total: Upper Arm (Left) - 13 minutes   DICTATION: .Other Dictation: Dictation Number [id number]  PLAN OF CARE: Outpatient with extended recovery    Delay start of Pharmacological VTE agent (>24hrs) due to surgical blood loss or risk of bleeding:  no

## 2012-12-13 NOTE — Transfer of Care (Signed)
Immediate Anesthesia Transfer of Care Note  Patient: David Wyatt  Procedure(s) Performed: Procedure(s): IRRIGATION AND DEBRIDEMENT LEFT THUMB (Left)  Patient Location: PACU  Anesthesia Type:General  Level of Consciousness: awake  Airway & Oxygen Therapy: Patient Spontanous Breathing and Patient connected to nasal cannula oxygen  Post-op Assessment: Report given to PACU RN and Post -op Vital signs reviewed and stable  Post vital signs: Reviewed and stable  Complications: No apparent anesthesia complications

## 2012-12-13 NOTE — Op Note (Signed)
[id number] 

## 2012-12-13 NOTE — ED Notes (Signed)
Pt is here with left leg pain for 5 days after he wakes up from hip down.  Pt has left thumb infection where he stuck himself with something.  Pt is diabetic

## 2012-12-13 NOTE — Consult Note (Signed)
[facility] Medical Consultation  Arsh Feutz YQM:[id number] DOB: 28-Jan-1961 DOA: 12/13/2012 PCP: No PCP Per Patient   Requesting physician: Dr. 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 Left Thumb Felon - s/p I+D POD 0, patient awake, eating, and otherwise feeling well, likely home in AM on bactrim DS for 7 days (per ortho's note), have put the order for bactrim into the computer to get this started.  Surgical cultures pending.  Will need to follow up to ensure resolution of abscess and follow cultures to ensure organism is sensitive to the empiric bactrim which is planned at this time. 2. DM2 - CBG checks AC/HS, continuing patient on his home glipizide and metformin while inpatient as he is now eating again.  I will followup again tomorrow. Please contact me if I can be of assistance in the meanwhile. Thank you for this consultation.  Chief Complaint: Left thumb infection  HPI:  52 yo M, accidentally stabbed his L thumb with a screwdriver a few days ago, since then has had erythema, swelling, and pain of the L thumb.  Presented to the ED today where L thumb abscess was diagnosed, patient taken to OR for I+D by ortho.  Medicine consulted for medical management while here.  Review of Systems:  No fevers, chills, night sweats, 12 systems reviewed and otherwise negative.  Past Medical History  Diagnosis Date  . Hypertension   . Cocaine abuse   . Type II diabetes mellitus   . Arthritis     "hands" (07/28/2012)  . Angioedema of lips 07/28/2012    left upper (07/29/2012)   Past Surgical History  Procedure Laterality Date  . Inguinal hernia repair  04/01/2012    Procedure: HERNIA REPAIR INGUINAL ADULT;  Surgeon: Currie Paris, MD;  Location: [facility] OR;  Service: General;  Laterality: Right;   . Hernia repair Right 04/01/2012  . Incision and drainage of wound      boil on back/notes 07/14/2008 (07/29/2012)   Social History:  reports that he has been smoking Cigarettes.  He has a 7.5 pack-year smoking history. He has never used smokeless tobacco. He reports that he uses illicit drugs ("Crack" cocaine and Cocaine). He reports that he does not drink alcohol.  No Known Allergies Family History  Problem Relation Age of Onset  . Diabetes Father     Prior to Admission medications   Medication Sig Start Date End Date Taking? Authorizing Provider  glipiZIDE (GLUCOTROL) 5 MG tablet Take 1 tablet (5 mg total) by mouth 2 (two) times daily before a meal. 07/30/12  Yes Vassie Loll, MD  hydrochlorothiazide (HYDRODIURIL) 25 MG tablet Take 1 tablet (25 mg total) by mouth daily. 08/29/12  Yes Carleene Cooper III, MD  isosorbide-hydrALAZINE (BIDIL) 20-37.5 MG per tablet Take 1 tablet by mouth 2 (two) times daily. 07/30/12  Yes Vassie Loll, MD  Lancets (FREESTYLE) lancets Use as instructed 07/30/12  Yes Vassie Loll, MD  lisinopril (PRINIVIL,ZESTRIL) 10 MG tablet Take 2 tablets (20 mg total) by mouth daily. 08/29/12  Yes Carleene Cooper III, MD  metFORMIN (GLUCOPHAGE) 500 MG tablet Take 1 tablet (500 mg total) by mouth 2 (two) times daily with a meal. 07/30/12  Yes Vassie Loll, MD  methocarbamol (ROBAXIN) 500 MG tablet Take 1 tablet (500 mg total) by mouth every 8 (eight) hours as needed (shoulder pain). 07/30/12  Yes Vassie Loll, MD  omeprazole (PRILOSEC) 20 MG capsule Take 20 mg by mouth daily.   Yes Historical Provider, MD  oxyCODONE-acetaminophen (PERCOCET) 5-325 MG per tablet 1-2 tabs po q6 hours prn pain 12/13/12   Tami Ribas, MD  sulfamethoxazole-trimethoprim (BACTRIM DS) 800-160 MG per tablet Take 1 tablet by mouth 2 (two) times daily. 12/13/12   Tami Ribas, MD   Physical Exam: Blood pressure 111/76, pulse 65, temperature 98.9 F (37.2 C), temperature source Oral, resp. rate 10, weight  78.274 kg (172 lb 9 oz), SpO2 97.00%. Filed Vitals:   12/13/12 1930  BP: 111/76  Pulse: 65  Temp: 98.9 F (37.2 C)  Resp: 10    General:  NAD, resting comfortably in bed Eyes: PEERLA EOMI ENT: mucous membranes moist Neck: supple w/o JVD Cardiovascular: RRR w/o MRG Respiratory: CTA B Abdomen: soft, nt, nd, bs+ Skin: no rash nor lesion Musculoskeletal: L thumb under dressing Psychiatric: normal tone and affect Neurologic: AAOx3, grossly non-focal   Labs on Admission:  Basic Metabolic Panel: No results found for this basename: NA, K, CL, CO2, GLUCOSE, BUN, CREATININE, CALCIUM, MG, PHOS,  in the last 168 hours Liver Function Tests: No results found for this basename: AST, ALT, ALKPHOS, BILITOT, PROT, ALBUMIN,  in the last 168 hours No results found for this basename: LIPASE, AMYLASE,  in the last 168 hours No results found for this basename: AMMONIA,  in the last 168 hours CBC:  Recent Labs Lab 12/13/12 1625  WBC 5.0  NEUTROABS 2.2  HGB 13.3  HCT 39.4  MCV 93.4  PLT 393   Cardiac Enzymes: No results found for this basename: CKTOTAL, CKMB, CKMBINDEX, TROPONINI,  in the last 168 hours BNP: No components found with this basename: POCBNP,  CBG:  Recent Labs Lab 12/13/12 1427 12/13/12 1857  GLUCAP 231* 185*    Radiological Exams on Admission: Dg Hand Complete Left  12/13/2012   CLINICAL DATA:  Left thumb pain.  EXAM: LEFT HAND - COMPLETE 3+ VIEW  COMPARISON:  None.  FINDINGS: Minimal degenerative change over the 2nd and 3rd MCP joints. Degenerative change at the 1st MCP joint and 1st interphalangeal joint. No acute fracture or dislocation.  IMPRESSION: No acute findings.   Electronically Signed   By: Elberta Fortis M.D.   On: 12/13/2012 15:34    EKG: Independently reviewed.  Time spent: 50 min  Kayven Aldaco M. [facility] Pager [phone]  If 7PM-7AM, please contact night-coverage [email or web address] Password [facility] 12/13/2012, 9:57 PM

## 2012-12-14 LAB — GLUCOSE, CAPILLARY: Glucose-Capillary: 128 mg/dL — ABNORMAL HIGH (ref 70–99)

## 2012-12-14 NOTE — Progress Notes (Signed)
12/14/12 Spoke with patient about PCP follow up, made appt with [facility] for 12/912 at 5pm. Appointment information given to patient. Jacquelynn Cree RN, BSN, CCM

## 2012-12-14 NOTE — Discharge Summary (Signed)
Physician Discharge Summary  David Wyatt ZOX:[id number] DOB: 1960/08/23 DOA: 12/13/2012  PCP: No PCP Per Patient  Admit date: 12/13/2012 Discharge date: 12/14/2012  Time spent: 35 minutes  Recommendations for Outpatient Follow-up:  1. Follow up final culture results from abscess.   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    Discharge Condition: Stable  Diet recommendation: Carb modified.   Filed Weights   12/13/12 1421  Weight: 78.274 kg (172 lb 9 oz)    History of present illness:  52 yo rhd male states he thinks he poked left thumb with screw driver a few days ago. Has since hand increased pain and swelling in left thumb. No fevers, chills, night sweats. Reports no previous injury to thumb and no other injury at this time.    Hospital Course:  Left Thumb Felon - s/p I+D by Dr Merlyn Lot on 10-05. Patient will be discharge on bactrim DS for 7 days (per ortho's note). Surgical cultures pending, this will need to be follow up by Dr Merlyn Lot. Patient has an appointment in 2 days with Dr Merlyn Lot.  Chronic Medical problems; continue with current home medications. Need to follow up with PCP. CM consulted.    Procedures:  I and D of left thumb   Consultations:  Dr Merlyn Lot  Discharge Exam: Filed Vitals:   12/14/12 0644  BP: 122/65  Pulse: 66  Temp: 100.9 F (38.3 C)  Resp: 16    General: no distress.  Cardiovascular: S 1, S 2 RRR Respiratory: CTA Extremities: left thumb with dressing/  Discharge Instructions  Discharge Orders   Future Orders Complete By Expires   Diet - low sodium heart healthy  As directed    Increase activity slowly  As directed        Medication List    STOP taking these medications       acetaminophen 325 MG tablet  Commonly known as:  TYLENOL      TAKE these medications       freestyle lancets  Use as instructed     glipiZIDE 5 MG tablet  Commonly known as:  GLUCOTROL  Take 1 tablet (5 mg total) by mouth 2 (two) times daily before a meal.     hydrochlorothiazide 25 MG tablet  Commonly known as:  HYDRODIURIL  Take 1 tablet (25 mg total) by mouth daily.     isosorbide-hydrALAZINE 20-37.5 MG per tablet  Commonly known as:  BIDIL  Take 1 tablet by mouth 2 (two) times daily.     lisinopril 10 MG tablet  Commonly known as:  PRINIVIL,ZESTRIL  Take 2 tablets (20 mg total) by mouth daily.     metFORMIN 500 MG tablet  Commonly known as:  GLUCOPHAGE  Take 1 tablet (500 mg total) by mouth 2 (two) times daily with a meal.     methocarbamol 500 MG tablet  Commonly known as:  ROBAXIN  Take 1 tablet (500 mg total) by mouth every 8 (eight) hours as needed (shoulder pain).     omeprazole 20 MG capsule  Commonly known as:  PRILOSEC  Take 20 mg by mouth daily.     oxyCODONE-acetaminophen 5-325 MG per tablet  Commonly known as:  PERCOCET  1-2 tabs po q6 hours prn pain     sulfamethoxazole-trimethoprim 800-160 MG per tablet  Commonly known as:  BACTRIM DS  Take 1 tablet by  mouth 2 (two) times daily.       No Known Allergies     Follow-up Information   Follow up with Tami Ribas, MD On 12/16/2012.   Specialty:  Orthopedic Surgery   Contact information:   [address] [place] [address] [phone]       Follow up with No PCP Per Patient.   Specialty:  General Practice   Contact information:   [address] [place] [address] [phone]        The results of significant diagnostics from this hospitalization (including imaging, microbiology, ancillary and laboratory) are listed below for reference.    Significant Diagnostic Studies: Dg Hand Complete Left  12/13/2012   CLINICAL DATA:  Left thumb pain.  EXAM: LEFT HAND - COMPLETE 3+ VIEW  COMPARISON:  None.  FINDINGS: Minimal degenerative change over the 2nd and 3rd MCP joints. Degenerative change at the 1st MCP joint and 1st  interphalangeal joint. No acute fracture or dislocation.  IMPRESSION: No acute findings.   Electronically Signed   By: Elberta Fortis M.D.   On: 12/13/2012 15:34    Microbiology: Recent Results (from the past 240 hour(s))  CULTURE, ROUTINE-ABSCESS     Status: None   Collection Time    12/13/12  6:29 PM      Result Value Range Status   Specimen Description ABSCESS LEFT THUMB   Final   Special Requests NONE   Final   Gram Stain PENDING   Incomplete   Culture     Final   Value: Culture reincubated for better growth     Performed at [facility]   Report Status PENDING   Incomplete     Labs: Basic Metabolic Panel:  Recent Labs Lab 12/13/12 2304  CREATININE 0.93   Liver Function Tests: No results found for this basename: AST, ALT, ALKPHOS, BILITOT, PROT, ALBUMIN,  in the last 168 hours No results found for this basename: LIPASE, AMYLASE,  in the last 168 hours No results found for this basename: AMMONIA,  in the last 168 hours CBC:  Recent Labs Lab 12/13/12 1625  WBC 5.0  NEUTROABS 2.2  HGB 13.3  HCT 39.4  MCV 93.4  PLT 393   Cardiac Enzymes: No results found for this basename: CKTOTAL, CKMB, CKMBINDEX, TROPONINI,  in the last 168 hours BNP: BNP (last 3 results) No results found for this basename: PROBNP,  in the last 8760 hours CBG:  Recent Labs Lab 12/13/12 1427 12/13/12 1857 12/13/12 2330 12/14/12 0716  GLUCAP 231* 185* 204* 128*       Signed:  Lennon Boutwell  [facility] 12/14/2012, 8:05 AM

## 2012-12-15 ENCOUNTER — Encounter (HOSPITAL_COMMUNITY): Payer: Self-pay | Admitting: Orthopedic Surgery

## 2012-12-16 LAB — CULTURE, ROUTINE-ABSCESS

## 2012-12-17 ENCOUNTER — Emergency Department (HOSPITAL_COMMUNITY)
Admission: EM | Admit: 2012-12-17 | Discharge: 2012-12-17 | Disposition: A | Discharge status: Home / Self Care | Payer: No Typology Code available for payment source | Attending: Emergency Medicine | Admitting: Emergency Medicine

## 2012-12-17 ENCOUNTER — Inpatient Hospital Stay: Payer: Self-pay

## 2012-12-17 ENCOUNTER — Encounter (HOSPITAL_COMMUNITY): Payer: Self-pay | Admitting: Emergency Medicine

## 2012-12-17 DIAGNOSIS — I10 Essential (primary) hypertension: Secondary | ICD-10-CM | POA: Insufficient documentation

## 2012-12-17 DIAGNOSIS — F17200 Nicotine dependence, unspecified, uncomplicated: Secondary | ICD-10-CM | POA: Insufficient documentation

## 2012-12-17 DIAGNOSIS — M19049 Primary osteoarthritis, unspecified hand: Secondary | ICD-10-CM | POA: Insufficient documentation

## 2012-12-17 DIAGNOSIS — Z79899 Other long term (current) drug therapy: Secondary | ICD-10-CM | POA: Insufficient documentation

## 2012-12-17 DIAGNOSIS — M79609 Pain in unspecified limb: Secondary | ICD-10-CM | POA: Insufficient documentation

## 2012-12-17 DIAGNOSIS — E119 Type 2 diabetes mellitus without complications: Secondary | ICD-10-CM | POA: Insufficient documentation

## 2012-12-17 DIAGNOSIS — R739 Hyperglycemia, unspecified: Secondary | ICD-10-CM

## 2012-12-17 DIAGNOSIS — G8918 Other acute postprocedural pain: Secondary | ICD-10-CM | POA: Insufficient documentation

## 2012-12-17 LAB — POCT I-STAT, CHEM 8
BUN: 11 mg/dL (ref 6–23)
Chloride: 101 mEq/L (ref 96–112)
Glucose, Bld: 304 mg/dL — ABNORMAL HIGH (ref 70–99)
HCT: 50 % (ref 39.0–52.0)
Potassium: 4.1 mEq/L (ref 3.5–5.1)

## 2012-12-17 LAB — GLUCOSE, CAPILLARY
Glucose-Capillary: 271 mg/dL — ABNORMAL HIGH (ref 70–99)
Glucose-Capillary: 165 mg/dL — ABNORMAL HIGH (ref 70–99)

## 2012-12-17 MED ORDER — SODIUM CHLORIDE 0.9 % IV BOLUS (SEPSIS)
1000.0000 mL | Freq: Once | INTRAVENOUS | Status: AC
  Administered 2012-12-17: 1000 mL via INTRAVENOUS

## 2012-12-17 MED ORDER — OXYCODONE-ACETAMINOPHEN 5-325 MG PO TABS
2.0000 | ORAL_TABLET | Freq: Once | ORAL | Status: AC
  Administered 2012-12-17: 2 via ORAL
  Filled 2012-12-17: qty 2

## 2012-12-17 NOTE — ED Provider Notes (Signed)
CSN: [id number]     Arrival date & time 12/17/12  0347 History   First MD Initiated Contact with Patient 12/17/12 0403     Chief Complaint  Patient presents with  . Hyperglycemia   HPI  History provided by the patient. Patient is a 52 year old male with a history of hypertension and diabetes who presents with concerns for elevated blood sugar. Patient states his blood sugar was reading 588 at home. He did take a dose of his metformin and does admit that he has not been taking this regularly as prescribed. Patient additionally complains of some continued pains to his left thumb. He underwent I&D of a felon last week. He does have scheduled followup with orthopedic hand specialist later today. He denies any associated fever, chills or sweats. He denies any recent nausea, vomiting or diarrhea symptoms. He has been eating normally. No other aggravating or alleviating factors. No other associated symptoms.    Past Medical History  Diagnosis Date  . Hypertension   . Cocaine abuse   . Type II diabetes mellitus   . Arthritis     "hands" (07/28/2012)  . Angioedema of lips 07/28/2012    left upper (07/29/2012)   Past Surgical History  Procedure Laterality Date  . Inguinal hernia repair  04/01/2012    Procedure: HERNIA REPAIR INGUINAL ADULT;  Surgeon: Currie Paris, MD;  Location: [facility] OR;  Service: General;  Laterality: Right;  . Hernia repair Right 04/01/2012  . Incision and drainage of wound      boil on back/notes 07/14/2008 (07/29/2012)  . I&d extremity Left 12/13/2012    Procedure: IRRIGATION AND DEBRIDEMENT LEFT THUMB;  Surgeon: Tami Ribas, MD;  Location: [facility] OR;  Service: Orthopedics;  Laterality: Left;   Family History  Problem Relation Age of Onset  . Diabetes Father    History  Substance Use Topics  . Smoking status: Current Every Day Smoker -- 0.25 packs/day for 30 years    Types: Cigarettes  . Smokeless tobacco: Never Used  . Alcohol Use: No    Review of Systems   Constitutional: Negative for fever, chills and diaphoresis.  Gastrointestinal: Negative for vomiting, abdominal pain and diarrhea.  Endocrine: Positive for polydipsia and polyuria.  All other systems reviewed and are negative.    Allergies  Review of patient's allergies indicates no known allergies.  Home Medications   Current Outpatient Rx  Name  Route  Sig  Dispense  Refill  . glipiZIDE (GLUCOTROL) 5 MG tablet   Oral   Take 1 tablet (5 mg total) by mouth 2 (two) times daily before a meal.   60 tablet   1   . hydrochlorothiazide (HYDRODIURIL) 25 MG tablet   Oral   Take 1 tablet (25 mg total) by mouth daily.   30 tablet   0   . isosorbide-hydrALAZINE (BIDIL) 20-37.5 MG per tablet   Oral   Take 1 tablet by mouth 2 (two) times daily.   60 tablet   1   . Lancets (FREESTYLE) lancets      Use as instructed   100 each   12   . lisinopril (PRINIVIL,ZESTRIL) 10 MG tablet   Oral   Take 2 tablets (20 mg total) by mouth daily.   30 tablet   0   . metFORMIN (GLUCOPHAGE) 500 MG tablet   Oral   Take 1 tablet (500 mg total) by mouth 2 (two) times daily with a meal.   60 tablet   1   .  methocarbamol (ROBAXIN) 500 MG tablet   Oral   Take 1 tablet (500 mg total) by mouth every 8 (eight) hours as needed (shoulder pain).   45 tablet   0   . omeprazole (PRILOSEC) 20 MG capsule   Oral   Take 20 mg by mouth daily.         Marland Kitchen oxyCODONE-acetaminophen (PERCOCET) 5-325 MG per tablet      1-2 tabs po q6 hours prn pain   30 tablet   0   . sulfamethoxazole-trimethoprim (BACTRIM DS) 800-160 MG per tablet   Oral   Take 1 tablet by mouth 2 (two) times daily.   14 tablet   0    BP 139/82  Pulse 92  Temp(Src) 98.6 F (37 C) (Oral)  Resp 18  SpO2 94% Physical Exam  Nursing note and vitals reviewed. Constitutional: He is oriented to person, place, and time. He appears well-developed and well-nourished. No distress.  HENT:  Head: Normocephalic.  Eyes: Conjunctivae are  normal.  Cardiovascular: Normal rate and regular rhythm.   Pulmonary/Chest: Effort normal and breath sounds normal. No respiratory distress. He has no wheezes. He has no rales.  Abdominal: Soft.  Musculoskeletal:  Healing wound to the pad of the left thumb. There is a small incision to the lateral aspect with packing in place. There is moderate tenderness around the area of the distal common. No proximal tenderness of the thumb or significant swelling. No erythema.  Neurological: He is alert and oriented to person, place, and time.  Skin: Skin is warm.  Psychiatric: He has a normal mood and affect. His behavior is normal.    ED Course  Procedures   COORDINATION OF CARE:  Nursing notes reviewed. Vital signs reviewed. Initial pt interview and examination performed.  Treatment plan initiated: Medications  sodium chloride 0.9 % bolus 1,000 mL (not administered)    5:03 AM-Discussed treatment plan with pt at bedside, which includes IV fluids to help improve sugar . Pt agrees with plan.  Initial diagnostic testing ordered.    No signs of DKA. IV fluids given for hyperglycemia. Patient has planned followup for continued evaluation of his thumb. This time he will be discharged home.  Results for orders placed during the hospital encounter of 12/17/12  GLUCOSE, CAPILLARY      Result Value Range   Glucose-Capillary 271 (*) 70 - 99 mg/dL  POCT I-STAT, CHEM 8      Result Value Range   Sodium 135  135 - 145 mEq/L   Potassium 4.1  3.5 - 5.1 mEq/L   Chloride 101  96 - 112 mEq/L   BUN 11  6 - 23 mg/dL   Creatinine, Ser 0.45  0.50 - 1.35 mg/dL   Glucose, Bld 409 (*) 70 - 99 mg/dL   Calcium, Ion 8.11  9.14 - 1.23 mmol/L   TCO2 20  0 - 100 mmol/L   Hemoglobin 17.0  13.0 - 17.0 g/dL   HCT 78.2  95.6 - 21.3 %   Anion gap equals 14.    MDM   1. Hyperglycemia        Angus Seller, PA-C 12/17/12 [phone]

## 2012-12-17 NOTE — ED Provider Notes (Signed)
Medical screening examination/treatment/procedure(s) were conducted as a shared visit with non-physician practitioner(s) and myself.  I personally evaluated the patient during the encounter    Brandt Loosen, MD 12/17/12 [phone]

## 2012-12-17 NOTE — ED Provider Notes (Signed)
Medical screening examination/treatment/procedure(s) were conducted as a shared visit with non-physician practitioner(s) and myself.  I personally evaluated the patient during the encounter  Insulin dependent diabetic man presents with report of polydyspsia and mild but generalized weakness. Checked BG at home and it was 528mg /dL so, came to ED. Patient is nontoxic appearing. Normal MS. No anion gap. He has responded well to tx with IVF and we anticipate discharge with plan for close outpatient f/u.   Brandt Loosen, MD 12/17/12 [phone]

## 2012-12-17 NOTE — ED Notes (Signed)
Patient states blood sugar running high for past two days, patient states 588 reading at home, patient states surgery on thumb on Monday and has been having increased urination since that time

## 2012-12-19 LAB — ANAEROBIC CULTURE

## 2012-12-30 ENCOUNTER — Ambulatory Visit: Payer: Self-pay

## 2013-01-18 ENCOUNTER — Inpatient Hospital Stay: Payer: Self-pay | Admitting: Internal Medicine

## 2013-02-10 ENCOUNTER — Other Ambulatory Visit: Payer: Self-pay

## 2013-02-10 ENCOUNTER — Emergency Department (HOSPITAL_COMMUNITY)
Admission: EM | Admit: 2013-02-10 | Discharge: 2013-02-10 | Disposition: A | Discharge status: Home / Self Care | Payer: No Typology Code available for payment source | Attending: Emergency Medicine | Admitting: Emergency Medicine

## 2013-02-10 ENCOUNTER — Encounter (HOSPITAL_COMMUNITY): Payer: Self-pay | Admitting: Emergency Medicine

## 2013-02-10 ENCOUNTER — Emergency Department (HOSPITAL_COMMUNITY): Payer: No Typology Code available for payment source

## 2013-02-10 DIAGNOSIS — R209 Unspecified disturbances of skin sensation: Secondary | ICD-10-CM | POA: Insufficient documentation

## 2013-02-10 DIAGNOSIS — Z79899 Other long term (current) drug therapy: Secondary | ICD-10-CM | POA: Insufficient documentation

## 2013-02-10 DIAGNOSIS — M19049 Primary osteoarthritis, unspecified hand: Secondary | ICD-10-CM | POA: Insufficient documentation

## 2013-02-10 DIAGNOSIS — F17200 Nicotine dependence, unspecified, uncomplicated: Secondary | ICD-10-CM | POA: Insufficient documentation

## 2013-02-10 DIAGNOSIS — M79609 Pain in unspecified limb: Secondary | ICD-10-CM | POA: Insufficient documentation

## 2013-02-10 DIAGNOSIS — R200 Anesthesia of skin: Secondary | ICD-10-CM

## 2013-02-10 DIAGNOSIS — F1410 Cocaine abuse, uncomplicated: Secondary | ICD-10-CM | POA: Insufficient documentation

## 2013-02-10 DIAGNOSIS — M79605 Pain in left leg: Secondary | ICD-10-CM

## 2013-02-10 DIAGNOSIS — I10 Essential (primary) hypertension: Secondary | ICD-10-CM | POA: Insufficient documentation

## 2013-02-10 DIAGNOSIS — M79602 Pain in left arm: Secondary | ICD-10-CM

## 2013-02-10 DIAGNOSIS — R739 Hyperglycemia, unspecified: Secondary | ICD-10-CM

## 2013-02-10 DIAGNOSIS — Z792 Long term (current) use of antibiotics: Secondary | ICD-10-CM | POA: Insufficient documentation

## 2013-02-10 DIAGNOSIS — E119 Type 2 diabetes mellitus without complications: Secondary | ICD-10-CM | POA: Insufficient documentation

## 2013-02-10 LAB — COMPREHENSIVE METABOLIC PANEL
ALT: 88 U/L — ABNORMAL HIGH (ref 0–53)
AST: 50 U/L — ABNORMAL HIGH (ref 0–37)
Alkaline Phosphatase: 101 U/L (ref 39–117)
BUN: 36 mg/dL — ABNORMAL HIGH (ref 6–23)
Calcium: 9 mg/dL (ref 8.4–10.5)
Chloride: 95 mEq/L — ABNORMAL LOW (ref 96–112)
GFR calc Af Amer: 47 mL/min — ABNORMAL LOW (ref >90)
Glucose, Bld: 450 mg/dL — ABNORMAL HIGH (ref 70–99)
Potassium: 5 mEq/L (ref 3.5–5.1)
Sodium: 133 mEq/L — ABNORMAL LOW (ref 135–145)
Total Bilirubin: 0.7 mg/dL (ref 0.3–1.2)
Total Protein: 7.8 g/dL (ref 6.0–8.3)

## 2013-02-10 LAB — GLUCOSE, CAPILLARY
Glucose-Capillary: 426 mg/dL — ABNORMAL HIGH (ref 70–99)
Glucose-Capillary: 111 mg/dL — ABNORMAL HIGH (ref 70–99)

## 2013-02-10 LAB — CBC
HCT: 42.2 % (ref 39.0–52.0)
Hemoglobin: 14.8 g/dL (ref 13.0–17.0)
MCH: 32.5 pg (ref 26.0–34.0)
RBC: 4.56 MIL/uL (ref 4.22–5.81)
RDW: 12.8 % (ref 11.5–15.5)
WBC: 7.2 10*3/uL (ref 4.0–10.5)

## 2013-02-10 MED ORDER — INSULIN ASPART 100 UNIT/ML ~~LOC~~ SOLN: 10.0000 [IU] | Freq: Once | SUBCUTANEOUS | Status: DC

## 2013-02-10 MED ORDER — SODIUM CHLORIDE 0.9 % IV BOLUS (SEPSIS)
1000.0000 mL | Freq: Once | INTRAVENOUS | Status: AC
  Administered 2013-02-10: 1000 mL via INTRAVENOUS

## 2013-02-10 MED ORDER — INSULIN ASPART 100 UNIT/ML ~~LOC~~ SOLN
8.0000 [IU] | Freq: Once | SUBCUTANEOUS | Status: AC
  Administered 2013-02-10: 8 [IU] via INTRAVENOUS
  Filled 2013-02-10: qty 1

## 2013-02-10 NOTE — ED Notes (Signed)
Pt initially c/o hyperglycemia. When pt came to triage not c/o chest pain or arm pain. EKG performed after initial triage when pt c/o left arm pain. Taken at 11 minutes.

## 2013-02-10 NOTE — ED Provider Notes (Signed)
CSN: [id number]     Arrival date & time 02/10/13  1158 History   First MD Initiated Contact with Patient 02/10/13 1424     Chief Complaint  Patient presents with  . Arm Pain  . Leg Pain  . Hyperglycemia   (Consider location/radiation/quality/duration/timing/severity/associated sxs/prior Treatment) HPI Comments: Patient is a 52 year old male with history of diabetes. He presents today with complaints of left arm and leg pain. States his sugars have been elevated the past several days. Also reports discomfort and numbness in the left side of his face his concern he may have had a stroke.  Patient is a 52 y.o. male presenting with hyperglycemia. The history is provided by the patient.  Hyperglycemia Severity:  Moderate Onset quality:  Gradual Duration:  3 days Timing:  Constant Progression:  Worsening Chronicity:  New Diabetes status:  Controlled with oral medications Time since last antidiabetic medication:  3 days Context: not change in medication   Relieved by:  Nothing Ineffective treatments:  None tried   Past Medical History  Diagnosis Date  . Hypertension   . Cocaine abuse   . Type II diabetes mellitus   . Arthritis     "hands" (07/28/2012)  . Angioedema of lips 07/28/2012    left upper (07/29/2012)   Past Surgical History  Procedure Laterality Date  . Inguinal hernia repair  04/01/2012    Procedure: HERNIA REPAIR INGUINAL ADULT;  Surgeon: Currie Paris, MD;  Location: [facility] OR;  Service: General;  Laterality: Right;  . Hernia repair Right 04/01/2012  . Incision and drainage of wound      boil on back/notes 07/14/2008 (07/29/2012)  . I&d extremity Left 12/13/2012    Procedure: IRRIGATION AND DEBRIDEMENT LEFT THUMB;  Surgeon: Tami Ribas, MD;  Location: [facility] OR;  Service: Orthopedics;  Laterality: Left;   Family History  Problem Relation Age of Onset  . Diabetes Father    History  Substance Use Topics  . Smoking status: Current Every Day Smoker -- 0.25 packs/day for  30 years    Types: Cigarettes  . Smokeless tobacco: Never Used  . Alcohol Use: No    Review of Systems  All other systems reviewed and are negative.    Allergies  Review of patient's allergies indicates no known allergies.  Home Medications   Current Outpatient Rx  Name  Route  Sig  Dispense  Refill  . acetaminophen (TYLENOL) 500 MG tablet   Oral   Take 500 mg by mouth every 6 (six) hours as needed for pain.         Marland Kitchen glipiZIDE (GLUCOTROL) 5 MG tablet   Oral   Take 1 tablet (5 mg total) by mouth 2 (two) times daily before a meal.   60 tablet   1   . hydrochlorothiazide (HYDRODIURIL) 25 MG tablet   Oral   Take 1 tablet (25 mg total) by mouth daily.   30 tablet   0   . isosorbide-hydrALAZINE (BIDIL) 20-37.5 MG per tablet   Oral   Take 1 tablet by mouth 2 (two) times daily.   60 tablet   1   . Lancets (FREESTYLE) lancets      Use as instructed   100 each   12   . lisinopril (PRINIVIL,ZESTRIL) 10 MG tablet   Oral   Take 2 tablets (20 mg total) by mouth daily.   30 tablet   0   . metFORMIN (GLUCOPHAGE) 500 MG tablet   Oral   Take 1  tablet (500 mg total) by mouth 2 (two) times daily with a meal.   60 tablet   1   . oxyCODONE-acetaminophen (PERCOCET) 5-325 MG per tablet      1-2 tabs po q6 hours prn pain   30 tablet   0   . sulfamethoxazole-trimethoprim (BACTRIM DS) 800-160 MG per tablet   Oral   Take 1 tablet by mouth 2 (two) times daily.   14 tablet   0    BP 120/68  Pulse 89  Temp(Src) 98.3 F (36.8 C) (Oral)  Resp 16  SpO2 97% Physical Exam  Nursing note and vitals reviewed. Constitutional: He is oriented to person, place, and time. He appears well-developed and well-nourished. No distress.  HENT:  Head: Normocephalic and atraumatic.  Mouth/Throat: Oropharynx is clear and moist.  Eyes: EOM are normal. Pupils are equal, round, and reactive to light.  Neck: Normal range of motion. Neck supple.  Cardiovascular: Normal rate, regular  rhythm and normal heart sounds.   No murmur heard. Pulmonary/Chest: Effort normal and breath sounds normal. No respiratory distress. He has no wheezes. He has no rales.  Abdominal: Soft. Bowel sounds are normal. He exhibits no distension. There is no tenderness.  Musculoskeletal: Normal range of motion. He exhibits no edema.  Neurological: He is alert and oriented to person, place, and time. No cranial nerve deficit. He exhibits normal muscle tone. Coordination normal.  Skin: Skin is warm. He is not diaphoretic.    ED Course  Procedures (including critical care time) Labs Review Labs Reviewed  COMPREHENSIVE METABOLIC PANEL - Abnormal; Notable for the following:    Sodium 133 (*)    Chloride 95 (*)    Glucose, Bld 450 (*)    BUN 36 (*)    Creatinine, Ser 1.82 (*)    AST 50 (*)    ALT 88 (*)    GFR calc non Af Amer 41 (*)    GFR calc Af Amer 47 (*)    All other components within normal limits  GLUCOSE, CAPILLARY - Abnormal; Notable for the following:    Glucose-Capillary 426 (*)    All other components within normal limits  GLUCOSE, CAPILLARY - Abnormal; Notable for the following:    Glucose-Capillary 111 (*)    All other components within normal limits  CBC  POCT I-STAT TROPONIN I   Imaging Review Ct Head Wo Contrast  02/10/2013   CLINICAL DATA:  Right facial drooping.  Left-sided weakness.  EXAM: CT HEAD WITHOUT CONTRAST  TECHNIQUE: Contiguous axial images were obtained from the base of the skull through the vertex without intravenous contrast.  COMPARISON:  None.  FINDINGS: Ventricle size is normal. Negative for acute infarct. Negative for hemorrhage or mass. No edema or midline shift. No skull lesion identified.  IMPRESSION: No acute abnormality.   Electronically Signed   By: Marlan Palau M.D.   On: 02/10/2013 15:48      MDM  No diagnosis found. Patient is a 52 year old male with history of diabetes and hypertension. He presents today with complaints of numbness in his  face and pain in his left arm and left leg. His sugars have been running high for the past several days as well. Workup revealed an elevated glucose of 450 for which she was given insulin. It was rechecked and is now 111. Is also given IV fluids and CT of the head was performed. This was negative. This point I feels that he is stable for discharge. I've advised him to  watch his diet, keep her of his sugars, and followup with his primary Dr. to discuss these results.    Geoffery Lyons, MD 02/10/13 [phone]

## 2013-02-10 NOTE — ED Notes (Signed)
CBG 111 

## 2013-02-10 NOTE — ED Notes (Signed)
Pt reports he has not been taking his Diabetic meds for several months.. Pt stated" I don't take my diabetes seriously.Today I came to visit someone in the hospital. When I was leaving My face was weak and I thought I was having a stroke."

## 2013-02-10 NOTE — ED Notes (Signed)
Pt reports visiting another patient here, states past few days pain to left arm/hand and left leg. Also states blood sugars running in 400's. No chest pain. Also reports voice feeling deep.

## 2013-02-26 ENCOUNTER — Encounter (HOSPITAL_COMMUNITY): Payer: Self-pay | Admitting: Emergency Medicine

## 2013-02-26 ENCOUNTER — Emergency Department (HOSPITAL_COMMUNITY)
Admission: EM | Admit: 2013-02-26 | Discharge: 2013-02-26 | Disposition: A | Discharge status: Home / Self Care | Payer: No Typology Code available for payment source | Attending: Emergency Medicine | Admitting: Emergency Medicine

## 2013-02-26 ENCOUNTER — Emergency Department (HOSPITAL_COMMUNITY): Payer: No Typology Code available for payment source

## 2013-02-26 DIAGNOSIS — Z79899 Other long term (current) drug therapy: Secondary | ICD-10-CM | POA: Insufficient documentation

## 2013-02-26 DIAGNOSIS — E119 Type 2 diabetes mellitus without complications: Secondary | ICD-10-CM | POA: Insufficient documentation

## 2013-02-26 DIAGNOSIS — M19049 Primary osteoarthritis, unspecified hand: Secondary | ICD-10-CM | POA: Insufficient documentation

## 2013-02-26 DIAGNOSIS — M25552 Pain in left hip: Secondary | ICD-10-CM

## 2013-02-26 DIAGNOSIS — F17200 Nicotine dependence, unspecified, uncomplicated: Secondary | ICD-10-CM | POA: Insufficient documentation

## 2013-02-26 DIAGNOSIS — G8929 Other chronic pain: Secondary | ICD-10-CM | POA: Insufficient documentation

## 2013-02-26 DIAGNOSIS — M25559 Pain in unspecified hip: Secondary | ICD-10-CM | POA: Insufficient documentation

## 2013-02-26 DIAGNOSIS — I10 Essential (primary) hypertension: Secondary | ICD-10-CM | POA: Insufficient documentation

## 2013-02-26 MED ORDER — HYDROCODONE-ACETAMINOPHEN 5-325 MG PO TABS
1.0000 | ORAL_TABLET | Freq: Once | ORAL | Status: AC
  Administered 2013-02-26: 1 via ORAL
  Filled 2013-02-26: qty 1

## 2013-02-26 MED ORDER — TRAMADOL HCL 50 MG PO TABS: 50.0000 mg | ORAL_TABLET | Freq: Four times a day (QID) | ORAL | Status: DC | PRN

## 2013-02-26 MED ORDER — HYDROCODONE-ACETAMINOPHEN 5-325 MG PO TABS: 1.0000 | ORAL_TABLET | ORAL | Status: DC | PRN

## 2013-02-26 NOTE — ED Notes (Signed)
Ortho paged for crutches 

## 2013-02-26 NOTE — ED Provider Notes (Signed)
Medical screening examination/treatment/procedure(s) were performed by non-physician practitioner and as supervising physician I was immediately available for consultation/collaboration.  Massa Pe M Prisca Gearing, MD 02/26/13 2034 

## 2013-02-26 NOTE — ED Provider Notes (Signed)
CSN: [id number]     Arrival date & time 02/26/13  0012 History   First MD Initiated Contact with Patient 02/26/13 0030     Chief Complaint  Patient presents with  . Hip Pain   (Consider location/radiation/quality/duration/timing/severity/associated sxs/prior Treatment) Patient is a 52 y.o. male presenting with hip pain. The history is provided by the patient. No language interpreter was used.  Hip Pain This is a chronic problem. The problem occurs constantly. The problem has been gradually worsening. Associated symptoms include arthralgias and myalgias. Pertinent negatives include no fever, joint swelling, numbness, urinary symptoms or weakness. The symptoms are aggravated by walking and standing. He has tried rest for the symptoms. The treatment provided no relief.    Past Medical History  Diagnosis Date  . Hypertension   . Cocaine abuse   . Type II diabetes mellitus   . Arthritis     "hands" (07/28/2012)  . Angioedema of lips 07/28/2012    left upper (07/29/2012)   Past Surgical History  Procedure Laterality Date  . Inguinal hernia repair  04/01/2012    Procedure: HERNIA REPAIR INGUINAL ADULT;  Surgeon: Currie Paris, MD;  Location: [facility] OR;  Service: General;  Laterality: Right;  . Hernia repair Right 04/01/2012  . Incision and drainage of wound      boil on back/notes 07/14/2008 (07/29/2012)  . I&d extremity Left 12/13/2012    Procedure: IRRIGATION AND DEBRIDEMENT LEFT THUMB;  Surgeon: Tami Ribas, MD;  Location: [facility] OR;  Service: Orthopedics;  Laterality: Left;   Family History  Problem Relation Age of Onset  . Diabetes Father    History  Substance Use Topics  . Smoking status: Current Every Day Smoker -- 0.25 packs/day for 30 years    Types: Cigarettes  . Smokeless tobacco: Never Used  . Alcohol Use: No    Review of Systems  Constitutional: Negative for fever.  Musculoskeletal: Positive for arthralgias and myalgias. Negative for joint swelling.  Skin: Negative for  color change.  Neurological: Negative for weakness and numbness.  All other systems reviewed and are negative.    Allergies  Review of patient's allergies indicates no known allergies.  Home Medications   Current Outpatient Rx  Name  Route  Sig  Dispense  Refill  . glipiZIDE (GLUCOTROL) 5 MG tablet   Oral   Take 1 tablet (5 mg total) by mouth 2 (two) times daily before a meal.   60 tablet   1   . metFORMIN (GLUCOPHAGE) 500 MG tablet   Oral   Take 1 tablet (500 mg total) by mouth 2 (two) times daily with a meal.   60 tablet   1   . HYDROcodone-acetaminophen (NORCO/VICODIN) 5-325 MG per tablet   Oral   Take 1 tablet by mouth every 4 (four) hours as needed.   5 tablet   0    BP 116/72  Pulse 72  Temp(Src) 99 F (37.2 C) (Oral)  Resp 18  Wt 164 lb 3.2 oz (74.481 kg)  SpO2 97%  Physical Exam  Nursing note and vitals reviewed. Constitutional: He is oriented to person, place, and time. He appears well-developed and well-nourished. No distress.  HENT:  Head: Normocephalic and atraumatic.  Eyes: Conjunctivae and EOM are normal. No scleral icterus.  Neck: Normal range of motion.  Cardiovascular: Normal rate, regular rhythm and intact distal pulses.   Pulses:      Dorsalis pedis pulses are 2+ on the left side.  Posterior tibial pulses are 2+ on the left side.  Pulmonary/Chest: Effort normal. No respiratory distress.  Musculoskeletal: He exhibits tenderness.       Left hip: He exhibits tenderness. He exhibits normal range of motion, normal strength, no bony tenderness, no swelling, no crepitus and no deformity.       Lumbar back: Normal.       Left upper leg: Normal.  Neurological: He is alert and oriented to person, place, and time. He has normal reflexes. No sensory deficit. He exhibits normal muscle tone. GCS eye subscore is 4. GCS verbal subscore is 5. GCS motor subscore is 6.  Reflex Scores:      Patellar reflexes are 2+ on the right side and 2+ on the left  side.      Achilles reflexes are 2+ on the right side and 2+ on the left side. No numbness, tingling or weakness of the affected extremity. Weight bearing without difficulty. Ambulatory with antalgic gait.  Skin: Skin is warm and dry. No rash noted. He is not diaphoretic. No erythema. No pallor.  Psychiatric: He has a normal mood and affect. His behavior is normal.    ED Course  Procedures (including critical care time) Labs Review Labs Reviewed - No data to display  Imaging Review Dg Hip Complete Left  02/26/2013   CLINICAL DATA:  Left hip pain that shoots down back of left leg  EXAM: LEFT HIP - COMPLETE 2+ VIEW  COMPARISON:  None.  FINDINGS: Hip and SI joint spaces preserved.  Osseous mineralization normal.  No acute fracture, dislocation, or bone destruction.  IMPRESSION: No acute osseous abnormalities.   Electronically Signed   By: Ulyses Southward M.D.   On: 02/26/2013 01:13    EKG Interpretation   None       MDM   1. Hip pain, left    Uncomplicated left hip pain. Patient neurovascularly intact and weightbearing. He is ambulatory with normal gait. Patient denies any trauma or injury inciting his pain. No back pain or signs concerning for cauda equina. X-ray negative for fracture, dislocation, or other bony abnormality. Patient treated in ED with Norco. He will be given crutches for WBAT. Patient discharged with short course of Vicodin and orthopedic follow up advised. Patient agreeable to plan with no unaddressed concerns.    Antony Madura, PA-C 03/08/13 1950

## 2013-02-26 NOTE — ED Notes (Signed)
Pt. reports left hip pain for 3 weeks , denies injury or fall , pt. also stated blurred vision for 1 1/2 weeks . Respirations unlabored .

## 2013-03-06 ENCOUNTER — Emergency Department (HOSPITAL_COMMUNITY): Payer: No Typology Code available for payment source

## 2013-03-06 ENCOUNTER — Encounter (HOSPITAL_COMMUNITY): Payer: Self-pay | Admitting: Emergency Medicine

## 2013-03-06 ENCOUNTER — Emergency Department (HOSPITAL_COMMUNITY)
Admission: EM | Admit: 2013-03-06 | Discharge: 2013-03-06 | Disposition: A | Discharge status: Home / Self Care | Payer: No Typology Code available for payment source | Attending: Emergency Medicine | Admitting: Emergency Medicine

## 2013-03-06 DIAGNOSIS — K5289 Other specified noninfective gastroenteritis and colitis: Secondary | ICD-10-CM | POA: Insufficient documentation

## 2013-03-06 DIAGNOSIS — K529 Noninfective gastroenteritis and colitis, unspecified: Secondary | ICD-10-CM

## 2013-03-06 DIAGNOSIS — Z5900 Homelessness unspecified: Secondary | ICD-10-CM | POA: Insufficient documentation

## 2013-03-06 DIAGNOSIS — I10 Essential (primary) hypertension: Secondary | ICD-10-CM | POA: Insufficient documentation

## 2013-03-06 DIAGNOSIS — M19049 Primary osteoarthritis, unspecified hand: Secondary | ICD-10-CM | POA: Insufficient documentation

## 2013-03-06 DIAGNOSIS — Z79899 Other long term (current) drug therapy: Secondary | ICD-10-CM | POA: Insufficient documentation

## 2013-03-06 DIAGNOSIS — F1410 Cocaine abuse, uncomplicated: Secondary | ICD-10-CM | POA: Insufficient documentation

## 2013-03-06 DIAGNOSIS — Z9889 Other specified postprocedural states: Secondary | ICD-10-CM | POA: Insufficient documentation

## 2013-03-06 DIAGNOSIS — R1084 Generalized abdominal pain: Secondary | ICD-10-CM | POA: Insufficient documentation

## 2013-03-06 DIAGNOSIS — F17200 Nicotine dependence, unspecified, uncomplicated: Secondary | ICD-10-CM | POA: Insufficient documentation

## 2013-03-06 DIAGNOSIS — E119 Type 2 diabetes mellitus without complications: Secondary | ICD-10-CM | POA: Insufficient documentation

## 2013-03-06 LAB — COMPREHENSIVE METABOLIC PANEL
ALT: 104 U/L — ABNORMAL HIGH (ref 0–53)
AST: 69 U/L — ABNORMAL HIGH (ref 0–37)
Albumin: 3.6 g/dL (ref 3.5–5.2)
Alkaline Phosphatase: 93 U/L (ref 39–117)
BUN: 18 mg/dL (ref 6–23)
CO2: 21 mEq/L (ref 19–32)
Calcium: 10 mg/dL (ref 8.4–10.5)
Chloride: 101 mEq/L (ref 96–112)
Creatinine, Ser: 1.39 mg/dL — ABNORMAL HIGH (ref 0.50–1.35)
GFR calc Af Amer: 65 mL/min — ABNORMAL LOW (ref >90)
GFR calc non Af Amer: 56 mL/min — ABNORMAL LOW (ref >90)
Glucose, Bld: 150 mg/dL — ABNORMAL HIGH (ref 70–99)
Potassium: 4 mEq/L (ref 3.5–5.1)
Sodium: 136 mEq/L (ref 135–145)
Total Bilirubin: 0.4 mg/dL (ref 0.3–1.2)
Total Protein: 8.3 g/dL (ref 6.0–8.3)

## 2013-03-06 LAB — CBC WITH DIFFERENTIAL/PLATELET
Basophils Absolute: 0 10*3/uL (ref 0.0–0.1)
Basophils Relative: 0 % (ref 0–1)
Eosinophils Absolute: 0.2 10*3/uL (ref 0.0–0.7)
Eosinophils Relative: 2 % (ref 0–5)
HCT: 44.6 % (ref 39.0–52.0)
Hemoglobin: 15.3 g/dL (ref 13.0–17.0)
Lymphocytes Relative: 14 % (ref 12–46)
Lymphs Abs: 1.5 10*3/uL (ref 0.7–4.0)
MCH: 32 pg (ref 26.0–34.0)
MCHC: 34.3 g/dL (ref 30.0–36.0)
MCV: 93.3 fL (ref 78.0–100.0)
Monocytes Absolute: 0.4 10*3/uL (ref 0.1–1.0)
Monocytes Relative: 3 % (ref 3–12)
Neutro Abs: 8.9 10*3/uL — ABNORMAL HIGH (ref 1.7–7.7)
Neutrophils Relative %: 81 % — ABNORMAL HIGH (ref 43–77)
Platelets: 278 10*3/uL (ref 150–400)
RBC: 4.78 MIL/uL (ref 4.22–5.81)
RDW: 13.2 % (ref 11.5–15.5)
WBC: 11 10*3/uL — ABNORMAL HIGH (ref 4.0–10.5)

## 2013-03-06 LAB — LIPASE, BLOOD: Lipase: 28 U/L (ref 11–59)

## 2013-03-06 MED ORDER — SODIUM CHLORIDE 0.9 % IV BOLUS (SEPSIS)
1000.0000 mL | Freq: Once | INTRAVENOUS | Status: AC
  Administered 2013-03-06: 1000 mL via INTRAVENOUS

## 2013-03-06 MED ORDER — PROMETHAZINE HCL 25 MG PO TABS: 25.0000 mg | ORAL_TABLET | Freq: Three times a day (TID) | ORAL | Status: DC | PRN

## 2013-03-06 MED ORDER — IOHEXOL 300 MG/ML  SOLN
50.0000 mL | Freq: Once | INTRAMUSCULAR | Status: AC | PRN
  Administered 2013-03-06: 50 mL via ORAL

## 2013-03-06 MED ORDER — IOHEXOL 300 MG/ML  SOLN
100.0000 mL | Freq: Once | INTRAMUSCULAR | Status: AC | PRN
  Administered 2013-03-06: 100 mL via INTRAVENOUS

## 2013-03-06 MED ORDER — METOCLOPRAMIDE HCL 5 MG/ML IJ SOLN
10.0000 mg | Freq: Once | INTRAMUSCULAR | Status: AC
  Administered 2013-03-06: 10 mg via INTRAVENOUS
  Filled 2013-03-06: qty 2

## 2013-03-06 NOTE — ED Notes (Signed)
Patient transported to CT 

## 2013-03-06 NOTE — ED Provider Notes (Signed)
CSN: [id number]     Arrival date & time 03/06/13  1036 History   First MD Initiated Contact with Patient 03/06/13 1046     Chief Complaint  Patient presents with  . Emesis   (Consider location/radiation/quality/duration/timing/severity/associated sxs/prior Treatment) HPI Patient presents emergency department with nausea, vomiting, and abdominal pain.  Patient, states, that his symptoms started earlier this morning.  Patient lives in a homeless shelter.  Patient denies chest pain, shortness of breath, headache, blurred vision, weakness, dizziness, fever, rash, back pain or syncope.  Patient, states nothing seems to make his condition, better.  Patient, states, that Past Medical History  Diagnosis Date  . Hypertension   . Cocaine abuse   . Type II diabetes mellitus   . Arthritis     "hands" (07/28/2012)  . Angioedema of lips 07/28/2012    left upper (07/29/2012)   Past Surgical History  Procedure Laterality Date  . Inguinal hernia repair  04/01/2012    Procedure: HERNIA REPAIR INGUINAL ADULT;  Surgeon: Currie Paris, MD;  Location: [facility] OR;  Service: General;  Laterality: Right;  . Hernia repair Right 04/01/2012  . Incision and drainage of wound      boil on back/notes 07/14/2008 (07/29/2012)  . I&d extremity Left 12/13/2012    Procedure: IRRIGATION AND DEBRIDEMENT LEFT THUMB;  Surgeon: Tami Ribas, MD;  Location: [facility] OR;  Service: Orthopedics;  Laterality: Left;   Family History  Problem Relation Age of Onset  . Diabetes Father    History  Substance Use Topics  . Smoking status: Current Every Day Smoker -- 0.25 packs/day for 30 years    Types: Cigarettes  . Smokeless tobacco: Never Used  . Alcohol Use: No    Review of Systems All other systems negative except as documented in the HPI. All pertinent positives and negatives as reviewed in the HPI. Allergies  Review of patient's allergies indicates no known allergies.  Home Medications   Current Outpatient Rx  Name  Route   Sig  Dispense  Refill  . glipiZIDE (GLUCOTROL) 5 MG tablet   Oral   Take 1 tablet (5 mg total) by mouth 2 (two) times daily before a meal.   60 tablet   1   . metFORMIN (GLUCOPHAGE) 500 MG tablet   Oral   Take 1 tablet (500 mg total) by mouth 2 (two) times daily with a meal.   60 tablet   1   . HYDROcodone-acetaminophen (NORCO/VICODIN) 5-325 MG per tablet   Oral   Take 1 tablet by mouth every 4 (four) hours as needed.   5 tablet   0    BP 113/69  Pulse 76  Temp(Src) 97.4 F (36.3 C) (Oral)  Resp 18  SpO2 98% Physical Exam  Constitutional: He is oriented to person, place, and time. He appears well-developed and well-nourished. No distress.  HENT:  Head: Normocephalic and atraumatic.  Mouth/Throat: Oropharynx is clear and moist.  Eyes: Pupils are equal, round, and reactive to light.  Neck: Normal range of motion. Neck supple.  Cardiovascular: Normal rate, regular rhythm and normal heart sounds.  Exam reveals no gallop and no friction rub.   No murmur heard. Pulmonary/Chest: Effort normal and breath sounds normal. No respiratory distress.  Abdominal: Soft. Normal appearance and bowel sounds are normal. He exhibits no distension. There is generalized tenderness. There is no rigidity, no rebound and no guarding.  Neurological: He is alert and oriented to person, place, and time.  Skin: Skin is warm and  dry.    ED Course  Procedures (including critical care time) Labs Review Labs Reviewed  CBC WITH DIFFERENTIAL - Abnormal; Notable for the following:    WBC 11.0 (*)    Neutrophils Relative % 81 (*)    Neutro Abs 8.9 (*)    All other components within normal limits  COMPREHENSIVE METABOLIC PANEL - Abnormal; Notable for the following:    Glucose, Bld 150 (*)    Creatinine, Ser 1.39 (*)    AST 69 (*)    ALT 104 (*)    GFR calc non Af Amer 56 (*)    GFR calc Af Amer 65 (*)    All other components within normal limits  LIPASE, BLOOD  URINALYSIS, ROUTINE W REFLEX  MICROSCOPIC   Imaging Review Ct Abdomen Pelvis W Contrast  03/06/2013   CLINICAL DATA:  Vomiting.  EXAM: CT ABDOMEN AND PELVIS WITH CONTRAST  TECHNIQUE: Multidetector CT imaging of the abdomen and pelvis was performed using the standard protocol following bolus administration of intravenous contrast.  CONTRAST:  50mL OMNIPAQUE IOHEXOL 300 MG/ML SOLN, OMNIPAQUE IOHEXOL 300 MG/ML SOLN  COMPARISON:  07/15/2008  FINDINGS: Circumferential thickening of the wall of the colon is seen beginning immediately superior to the cecum and extending across the hepatic flexure into the proximal transverse colon. Colonic wall thickness is as much as 14 mm on coronal reconstructions. The entire colon also contains a moderate amount of fluid. Findings are suggestive of colitis and enteritis. There is no evidence of associated bowel obstruction, perforation or abscess. Small bowel appears normal.  The liver, gallbladder, pancreas, spleen and adrenal glands are within normal limits. Both kidneys contain small nonobstructing calculi. No soft tissue masses or enlarged lymph nodes are seen. No hernias are identified. The bladder is unremarkable. Bony structures show degenerative disc disease at L5-S1.  IMPRESSION: Thickening of the proximal colon consistent with colitis. A component of diffuse colonic enteritis is also suspected based on a diffusely fluid filled colon. There is no evidence of bowel obstruction, perforation or focal abscess.   Electronically Signed   By: Irish Lack M.D.   On: 03/06/2013 13:37     Patient is feeling improved following IV fluids and antiemetics and CT scan shows.  The patient most likely has enteritis patient's blood sugar is stable, and his vital signs are stable as well.  Patient is eating and drinking without difficulty.  He is advised to return here as needed.  Advised him to followup at the [facility].     Carlyle Dolly, PA-C 03/06/13 1418

## 2013-03-06 NOTE — ED Notes (Signed)
Bed: WA06 Expected date:  Expected time:  Means of arrival:  Comments: EMS 

## 2013-03-06 NOTE — ED Notes (Addendum)
He told EMS that he has had several episodes of emesis today, beginning at ~0700 today.  CBG per EMS was 151.  He also c/o generalized abd. Discomfort and recent "hard stools"; the most recent of which he had this morning.  He was picked up at [facility].  He states he is diabetic.  He states he feels better, and his nausea has resolved after having received IV Zofran en route to hospital.  [place].

## 2013-03-06 NOTE — ED Provider Notes (Signed)
Medical screening examination/treatment/procedure(s) were performed by non-physician practitioner and as supervising physician I was immediately available for consultation/collaboration.  EKG Interpretation   None        Alieu Finnigan F Katarzyna Wolven, MD 03/06/13 1911 

## 2013-03-06 NOTE — ED Notes (Signed)
Pt attempted to urinate but was only able to obtain a amount to small for collection at this time. Pt aware urine still needs to be collected

## 2013-03-08 NOTE — ED Provider Notes (Signed)
Medical screening examination/treatment/procedure(s) were performed by non-physician practitioner and as supervising physician I was immediately available for consultation/collaboration.  Hurman Horn, MD 03/08/13 2059

## 2013-04-04 ENCOUNTER — Emergency Department (HOSPITAL_COMMUNITY)
Admission: EM | Admit: 2013-04-04 | Discharge: 2013-04-04 | Disposition: A | Discharge status: Home / Self Care | Payer: No Typology Code available for payment source | Attending: Emergency Medicine | Admitting: Emergency Medicine

## 2013-04-04 ENCOUNTER — Encounter (HOSPITAL_COMMUNITY): Payer: Self-pay | Admitting: Emergency Medicine

## 2013-04-04 DIAGNOSIS — I10 Essential (primary) hypertension: Secondary | ICD-10-CM | POA: Insufficient documentation

## 2013-04-04 DIAGNOSIS — E1140 Type 2 diabetes mellitus with diabetic neuropathy, unspecified: Secondary | ICD-10-CM

## 2013-04-04 DIAGNOSIS — Z8739 Personal history of other diseases of the musculoskeletal system and connective tissue: Secondary | ICD-10-CM | POA: Insufficient documentation

## 2013-04-04 DIAGNOSIS — F17200 Nicotine dependence, unspecified, uncomplicated: Secondary | ICD-10-CM | POA: Insufficient documentation

## 2013-04-04 DIAGNOSIS — R630 Anorexia: Secondary | ICD-10-CM | POA: Insufficient documentation

## 2013-04-04 DIAGNOSIS — R112 Nausea with vomiting, unspecified: Secondary | ICD-10-CM | POA: Insufficient documentation

## 2013-04-04 DIAGNOSIS — Z87828 Personal history of other (healed) physical injury and trauma: Secondary | ICD-10-CM | POA: Insufficient documentation

## 2013-04-04 DIAGNOSIS — E1149 Type 2 diabetes mellitus with other diabetic neurological complication: Secondary | ICD-10-CM | POA: Insufficient documentation

## 2013-04-04 DIAGNOSIS — R000 Tachycardia, unspecified: Secondary | ICD-10-CM | POA: Insufficient documentation

## 2013-04-04 DIAGNOSIS — M5430 Sciatica, unspecified side: Secondary | ICD-10-CM | POA: Insufficient documentation

## 2013-04-04 DIAGNOSIS — E1142 Type 2 diabetes mellitus with diabetic polyneuropathy: Secondary | ICD-10-CM | POA: Insufficient documentation

## 2013-04-04 DIAGNOSIS — Z79899 Other long term (current) drug therapy: Secondary | ICD-10-CM | POA: Insufficient documentation

## 2013-04-04 DIAGNOSIS — K219 Gastro-esophageal reflux disease without esophagitis: Secondary | ICD-10-CM

## 2013-04-04 LAB — CBC WITH DIFFERENTIAL/PLATELET
BASOS ABS: 0 10*3/uL (ref 0.0–0.1)
BASOS PCT: 0 % (ref 0–1)
Eosinophils Absolute: 0.3 10*3/uL (ref 0.0–0.7)
Eosinophils Relative: 3 % (ref 0–5)
HEMATOCRIT: 41.1 % (ref 39.0–52.0)
Hemoglobin: 14.5 g/dL (ref 13.0–17.0)
Lymphocytes Relative: 16 % (ref 12–46)
Lymphs Abs: 1.4 10*3/uL (ref 0.7–4.0)
MCH: 32.7 pg (ref 26.0–34.0)
MCHC: 35.3 g/dL (ref 30.0–36.0)
MCV: 92.6 fL (ref 78.0–100.0)
MONO ABS: 0.4 10*3/uL (ref 0.1–1.0)
Monocytes Relative: 4 % (ref 3–12)
Neutro Abs: 6.7 10*3/uL (ref 1.7–7.7)
Neutrophils Relative %: 77 % (ref 43–77)
Platelets: 355 10*3/uL (ref 150–400)
RBC: 4.44 MIL/uL (ref 4.22–5.81)
RDW: 13.7 % (ref 11.5–15.5)
WBC: 8.7 10*3/uL (ref 4.0–10.5)

## 2013-04-04 LAB — COMPREHENSIVE METABOLIC PANEL
ALBUMIN: 3.4 g/dL — AB (ref 3.5–5.2)
ALT: 30 U/L (ref 0–53)
AST: 27 U/L (ref 0–37)
Alkaline Phosphatase: 75 U/L (ref 39–117)
BUN: 30 mg/dL — ABNORMAL HIGH (ref 6–23)
CO2: 22 meq/L (ref 19–32)
CREATININE: 1.15 mg/dL (ref 0.50–1.35)
Calcium: 8.8 mg/dL (ref 8.4–10.5)
Chloride: 101 mEq/L (ref 96–112)
GFR calc Af Amer: 82 mL/min — ABNORMAL LOW (ref >90)
GFR calc non Af Amer: 71 mL/min — ABNORMAL LOW (ref >90)
Glucose, Bld: 141 mg/dL — ABNORMAL HIGH (ref 70–99)
Potassium: 4.2 mEq/L (ref 3.7–5.3)
Sodium: 138 mEq/L (ref 137–147)
Total Bilirubin: 0.7 mg/dL (ref 0.3–1.2)
Total Protein: 7.8 g/dL (ref 6.0–8.3)

## 2013-04-04 LAB — LIPASE, BLOOD: LIPASE: 24 U/L (ref 11–59)

## 2013-04-04 MED ORDER — HYDROCODONE-ACETAMINOPHEN 5-325 MG PO TABS: 1.0000 | ORAL_TABLET | Freq: Four times a day (QID) | ORAL | Status: DC | PRN

## 2013-04-04 MED ORDER — MORPHINE SULFATE 4 MG/ML IJ SOLN
4.0000 mg | Freq: Once | INTRAMUSCULAR | Status: AC
  Administered 2013-04-04: 4 mg via INTRAVENOUS
  Filled 2013-04-04: qty 1

## 2013-04-04 MED ORDER — SODIUM CHLORIDE 0.9 % IV BOLUS (SEPSIS)
1000.0000 mL | Freq: Once | INTRAVENOUS | Status: AC
  Administered 2013-04-04: 1000 mL via INTRAVENOUS

## 2013-04-04 MED ORDER — FAMOTIDINE IN NACL 20-0.9 MG/50ML-% IV SOLN
20.0000 mg | Freq: Once | INTRAVENOUS | Status: AC
  Administered 2013-04-04: 20 mg via INTRAVENOUS
  Filled 2013-04-04: qty 50

## 2013-04-04 MED ORDER — GI COCKTAIL ~~LOC~~
30.0000 mL | Freq: Once | ORAL | Status: AC
  Administered 2013-04-04: 30 mL via ORAL
  Filled 2013-04-04: qty 30

## 2013-04-04 MED ORDER — FAMOTIDINE 40 MG PO TABS: 40.0000 mg | ORAL_TABLET | Freq: Every day | ORAL | Status: DC

## 2013-04-04 MED ORDER — ONDANSETRON HCL 4 MG/2ML IJ SOLN
4.0000 mg | Freq: Once | INTRAMUSCULAR | Status: AC
  Administered 2013-04-04: 4 mg via INTRAVENOUS
  Filled 2013-04-04: qty 2

## 2013-04-04 NOTE — ED Notes (Signed)
Pt resting quietly at the time. 5/10 lower abdominal pain at present. Unable to void at the time.

## 2013-04-04 NOTE — Discharge Instructions (Signed)
Back Exercises  Back exercises help treat and prevent back injuries. The goal is to increase your strength in your belly (abdominal) and back muscles. These exercises can also help with flexibility. Start these exercises when told by your doctor.  HOME CARE  Back exercises include:  Pelvic Tilt.  · Lie on your back with your knees bent. Tilt your pelvis until the lower part of your back is against the floor. Hold this position 5 to 10 sec. Repeat this exercise 5 to 10 times.  Knee to Chest.  · Pull 1 knee up against your chest and hold for 20 to 30 seconds. Repeat this with the other knee. This may be done with the other leg straight or bent, whichever feels better. Then, pull both knees up against your chest.  Sit-Ups or Curl-Ups.  · Bend your knees 90 degrees. Start with tilting your pelvis, and do a partial, slow sit-up. Only lift your upper half 30 to 45 degrees off the floor. Take at least 2 to 3 seonds for each sit-up. Do not do sit-ups with your knees out straight. If partial sit-ups are difficult, simply do the above but with only tightening your belly (abdominal) muscles and holding it as told.  Hip-Lift.  · Lie on your back with your knees flexed 90 degrees. Push down with your feet and shoulders as you raise your hips 2 inches off the floor. Hold for 10 seconds, repeat 5 to 10 times.  Back Arches.  · Lie on your stomach. Prop yourself up on bent elbows. Slowly press on your hands, causing an arch in your low back. Repeat 3 to 5 times.  Shoulder-Lifts.  · Lie face down with arms beside your body. Keep hips and belly pressed to floor as you slowly lift your head and shoulders off the floor.  Do not overdo your exercises. Be careful in the beginning. Exercises may cause you some mild back discomfort. If the pain lasts for more than 15 minutes, stop the exercises until you see your doctor. Improvement with exercise for back problems is slow.   Document Released: 03/30/2010 Document Revised: 05/20/2011  Document Reviewed: 12/27/2010  [employer]® Patient Information ©2014 [employer].

## 2013-04-04 NOTE — ED Provider Notes (Addendum)
CSN: [id number]     Arrival date & time 04/04/13  5686 History   First MD Initiated Contact with Patient 04/04/13 [phone]     Chief Complaint  Patient presents with  . Abdominal Pain   (Consider location/radiation/quality/duration/timing/severity/associated sxs/prior Treatment) Patient is a 53 y.o. male presenting with abdominal pain. The history is provided by the patient.  Abdominal Pain Pain location:  Epigastric and periumbilical Pain quality: aching, burning and cramping   Pain radiates to:  Does not radiate Pain severity:  Moderate Onset quality:  Gradual Duration:  16 hours Timing:  Constant Progression:  Waxing and waning Chronicity:  New Context comment:  Started after eating icecream yesterday.  also increase metformin 2 days ago to 2000mg  Relieved by:  None tried Worsened by:  Eating Ineffective treatments:  None tried Associated symptoms: anorexia, nausea and vomiting   Associated symptoms: no cough, no diarrhea, no fever and no shortness of breath   Risk factors: no alcohol abuse and no NSAID use     Past Medical History  Diagnosis Date  . Hypertension   . Cocaine abuse   . Type II diabetes mellitus   . Arthritis     "hands" (07/28/2012)  . Angioedema of lips 07/28/2012    left upper (07/29/2012)   Past Surgical History  Procedure Laterality Date  . Inguinal hernia repair  04/01/2012    Procedure: HERNIA REPAIR INGUINAL ADULT;  Surgeon: Haywood Lasso, MD;  Location: ;  Service: General;  Laterality: Right;  . Hernia repair Right 04/01/2012  . Incision and drainage of wound      boil on back/notes 07/14/2008 (07/29/2012)  . I&d extremity Left 12/13/2012    Procedure: IRRIGATION AND DEBRIDEMENT LEFT THUMB;  Surgeon: Tennis Must, MD;  Location: [place];  Service: Orthopedics;  Laterality: Left;   Family History  Problem Relation Age of Onset  . Diabetes Father    History  Substance Use Topics  . Smoking status: Current Every Day Smoker -- 0.25 packs/day  for 30 years    Types: Cigarettes  . Smokeless tobacco: Never Used  . Alcohol Use: No    Review of Systems  Constitutional: Negative for fever.  Respiratory: Negative for cough and shortness of breath.   Gastrointestinal: Positive for nausea, vomiting, abdominal pain and anorexia. Negative for diarrhea.  Musculoskeletal: Positive for back pain.       Numbness in the fingers and toes that burns and tingles  All other systems reviewed and are negative.    Allergies  Review of patient's allergies indicates no known allergies.  Home Medications   Current Outpatient Rx  Name  Route  Sig  Dispense  Refill  . gabapentin (NEURONTIN) 600 MG tablet   Oral   Take 600 mg by mouth at bedtime.         Marland Kitchen glipiZIDE (GLUCOTROL XL) 10 MG 24 hr tablet   Oral   Take 20 mg by mouth daily with breakfast.         . lisinopril (PRINIVIL,ZESTRIL) 10 MG tablet   Oral   Take 10 mg by mouth daily.         . metFORMIN (GLUCOPHAGE) 1000 MG tablet   Oral   Take 2,000 mg by mouth daily with breakfast.         . pravastatin (PRAVACHOL) 20 MG tablet   Oral   Take 20 mg by mouth at bedtime.          BP 127/61  Pulse  98  Temp(Src) 97.5 F (36.4 C) (Oral)  Resp 16  Ht 5\' 8"  (1.727 m)  Wt 180 lb (81.647 kg)  BMI 27.38 kg/m2  SpO2 98% Physical Exam  Nursing note and vitals reviewed. Constitutional: He is oriented to person, place, and time. He appears well-developed and well-nourished. No distress.  HENT:  Head: Normocephalic and atraumatic.  Mouth/Throat: Oropharynx is clear and moist.  Eyes: Conjunctivae and EOM are normal. Pupils are equal, round, and reactive to light.  Neck: Normal range of motion. Neck supple.  Cardiovascular: Regular rhythm and intact distal pulses.  Tachycardia present.   No murmur heard. Pulmonary/Chest: Effort normal and breath sounds normal. No respiratory distress. He has no wheezes. He has no rales.  Abdominal: Soft. He exhibits no distension. There is  tenderness in the epigastric area. There is no rebound and no guarding.  Soft reducible umbilical hernia  Musculoskeletal: Normal range of motion. He exhibits no edema and no tenderness.  Neurological: He is alert and oriented to person, place, and time.  Skin: Skin is warm and dry. No rash noted. No erythema.  Psychiatric: He has a normal mood and affect. His behavior is normal.    ED Course  Procedures (including critical care time) Labs Review Labs Reviewed  COMPREHENSIVE METABOLIC PANEL - Abnormal; Notable for the following:    Glucose, Bld 141 (*)    BUN 30 (*)    Albumin 3.4 (*)    GFR calc non Af Amer 71 (*)    GFR calc Af Amer 82 (*)    All other components within normal limits  CBC WITH DIFFERENTIAL  LIPASE, BLOOD  URINALYSIS, ROUTINE W REFLEX MICROSCOPIC   Imaging Review No results found.  EKG Interpretation   None       MDM   1. GERD (gastroesophageal reflux disease)   2. Diabetic neuropathy   3. Sciatica     Patient with a history of hypertension, diabetes who presents today with abdominal burning and cramping, one episode of vomiting since yesterday. He states recently he increased his metformin to 2000 mg which he started 2 days ago and after eating ice cream at lunch he has had severe stomach cramping. He denies NSAID use, alcohol use in no prior history of gastric ulcers. He is a soft reducible umbilical hernia on exam but otherwise only mild epigastric tenderness. Low suspicion for cholecystitis, pancreatitis, appendicitis or diverticulitis at this time. Patient has not displayed any symptoms concerning for obstruction. Feel most likely that this is either related to his recent increase in metformin versus GERD.  CBC, CMP, lipase, UA pending. Patient given GI cocktail, Zofran and IV fluids as he is mildly tachycardic upon my exam.  Secondly patient is complaining of months of pain suggestive of lumbar radiculopathy and neuropathy. He is recently started  Neurontin which he does not feel is helping his pain and he is using crutches to walk.  He is neurovascularly intact with normal strength and sensation in the upper and lower extremities except in the fingertips and toes which are decreased sensation which feels most likely due to neuropathy.  9:05 AM Lungs are within normal limits. Patient feels better after GI cocktail and Pepcid. Will start a course of 2 weeks of Pepcid medications reviewed with the patient said he knows what he is supposed to be taking and given pain medication for his sciatica.    Blanchie Dessert, MD 04/04/13 [place], MD 04/04/13 [phone]

## 2013-04-04 NOTE — ED Notes (Signed)
Pt states he has been having abd pain for three days that is burning in nature.  Pt states he thinks the pain might be coming from his back.

## 2013-04-09 ENCOUNTER — Encounter (HOSPITAL_COMMUNITY): Payer: Self-pay | Admitting: Emergency Medicine

## 2013-04-09 ENCOUNTER — Emergency Department (HOSPITAL_COMMUNITY)
Admission: EM | Admit: 2013-04-09 | Discharge: 2013-04-09 | Disposition: A | Discharge status: Home / Self Care | Payer: No Typology Code available for payment source | Attending: Emergency Medicine | Admitting: Emergency Medicine

## 2013-04-09 DIAGNOSIS — Y9389 Activity, other specified: Secondary | ICD-10-CM | POA: Insufficient documentation

## 2013-04-09 DIAGNOSIS — E119 Type 2 diabetes mellitus without complications: Secondary | ICD-10-CM | POA: Insufficient documentation

## 2013-04-09 DIAGNOSIS — Z23 Encounter for immunization: Secondary | ICD-10-CM | POA: Insufficient documentation

## 2013-04-09 DIAGNOSIS — Z79899 Other long term (current) drug therapy: Secondary | ICD-10-CM | POA: Insufficient documentation

## 2013-04-09 DIAGNOSIS — M19049 Primary osteoarthritis, unspecified hand: Secondary | ICD-10-CM | POA: Insufficient documentation

## 2013-04-09 DIAGNOSIS — T1590XA Foreign body on external eye, part unspecified, unspecified eye, initial encounter: Secondary | ICD-10-CM | POA: Insufficient documentation

## 2013-04-09 DIAGNOSIS — H02056 Trichiasis without entropian left eye, unspecified eyelid: Secondary | ICD-10-CM

## 2013-04-09 DIAGNOSIS — L679 Hair color and hair shaft abnormality, unspecified: Secondary | ICD-10-CM | POA: Insufficient documentation

## 2013-04-09 DIAGNOSIS — Y9289 Other specified places as the place of occurrence of the external cause: Secondary | ICD-10-CM | POA: Insufficient documentation

## 2013-04-09 DIAGNOSIS — F17200 Nicotine dependence, unspecified, uncomplicated: Secondary | ICD-10-CM | POA: Insufficient documentation

## 2013-04-09 DIAGNOSIS — I10 Essential (primary) hypertension: Secondary | ICD-10-CM | POA: Insufficient documentation

## 2013-04-09 LAB — GLUCOSE, CAPILLARY: Glucose-Capillary: 155 mg/dL — ABNORMAL HIGH (ref 70–99)

## 2013-04-09 MED ORDER — TOBRAMYCIN 0.3 % OP SOLN: 1.0000 [drp] | OPHTHALMIC | Status: DC

## 2013-04-09 MED ORDER — FLUORESCEIN SODIUM 1 MG OP STRP
1.0000 | ORAL_STRIP | Freq: Once | OPHTHALMIC | Status: AC
  Administered 2013-04-09: 1 via OPHTHALMIC
  Filled 2013-04-09: qty 1

## 2013-04-09 MED ORDER — TETRACAINE HCL 0.5 % OP SOLN
1.0000 [drp] | Freq: Once | OPHTHALMIC | Status: AC
  Administered 2013-04-09: 1 [drp] via OPHTHALMIC
  Filled 2013-04-09: qty 2

## 2013-04-09 MED ORDER — TETANUS-DIPHTH-ACELL PERTUSSIS 5-2.5-18.5 LF-MCG/0.5 IM SUSP
0.5000 mL | Freq: Once | INTRAMUSCULAR | Status: AC
  Administered 2013-04-09: 0.5 mL via INTRAMUSCULAR
  Filled 2013-04-09: qty 0.5

## 2013-04-09 NOTE — Discharge Instructions (Signed)
Your physical exam today did not reveal a foreign body in your left eye or a corneal abrasion or ulcer. Still, recommend you followup with an ophthalmologist in use tobramycin to cover for infection. Your tetanus has been updated today. Return if symptoms worsen.  Eye, Foreign Body A foreign body is an object that should not be there. The object could be near, on, or in the eye. HOME CARE If your doctor prescribes an eye patch:  Keep the eye patch on. Do this until you see your doctor again.  Do not remove the patch to put in medicine unless your doctor tells you.  Retape it as it was before:  When replacing the patch.  If the patch comes loose.  Do not drive or use machinery.  Only take medicine as told by your doctor. If your doctor does not prescribe an eye patch:  Keep the eye closed as much as possible.  Do not rub the eye.  Wear dark glasses in bright light.  Do not wear contact lenses until the eye feels normal, or as told by your doctor.  Wear protective eye covering, especially when using high speed tools.  Only take medicine as told by your doctor. GET HELP RIGHT AWAY IF:   Your pain gets worse.  Your vision changes.  You have problems with the eye patch.  The injury gets larger.  There is fluid (discharge) coming from the eye.  You get puffiness (swelling) and soreness.  You have an oral temperature above 102 F (38.9 C), not controlled by medicine.  Your baby is older than 3 months with a rectal temperature of 102 F (38.9 C) or higher.  Your baby is 27 months old or younger with a rectal temperature of 100.4 F (38 C) or higher. MAKE SURE YOU:   Understand these instructions.  Will watch your condition.  Will get help right away if you are not doing well or get worse. Document Released: 08/15/2009 Document Revised: 05/20/2011 Document Reviewed: 08/15/2009 [facility] Patient Information 2014 [place].

## 2013-04-09 NOTE — ED Provider Notes (Signed)
CSN: [id number]     Arrival date & time 04/09/13  0113 History   First MD Initiated Contact with Patient 04/09/13 0201     Chief Complaint  Patient presents with  . Foreign Body in [place]   (Consider location/radiation/quality/duration/timing/severity/associated sxs/prior Treatment) HPI Comments: Patient is a 53 y/o male who presents for a FB sensation in his L eye. Patient states he was with a friend this evening who was welding metal when one of the sparks flew up and hit him in the eye. Patient states he has had a scratching sensation in this eye since this time. He tried a water eye flush without relief. He denies bleeding from the eye, blurred vision, pain with eye movement, and bleeding from his eye. Patient cannot recall the date of his last tetanus shot.  Patient is a 53 y.o. male presenting with foreign body in eye. The history is provided by the patient. No language interpreter was used.  Foreign Body in Eye Pertinent negatives include no fever or headaches.    Past Medical History  Diagnosis Date  . Hypertension   . Cocaine abuse   . Type II diabetes mellitus   . Arthritis     "hands" (07/28/2012)  . Angioedema of lips 07/28/2012    left upper (07/29/2012)   Past Surgical History  Procedure Laterality Date  . Inguinal hernia repair  04/01/2012    Procedure: HERNIA REPAIR INGUINAL ADULT;  Surgeon: Haywood Lasso, MD;  Location: [place];  Service: General;  Laterality: Right;  . Hernia repair Right 04/01/2012  . Incision and drainage of wound      boil on back/notes 07/14/2008 (07/29/2012)  . I&d extremity Left 12/13/2012    Procedure: IRRIGATION AND DEBRIDEMENT LEFT THUMB;  Surgeon: Tennis Must, MD;  Location: [place];  Service: Orthopedics;  Laterality: Left;   Family History  Problem Relation Age of Onset  . Diabetes Father    History  Substance Use Topics  . Smoking status: Current Every Day Smoker -- 0.25 packs/day for 30 years    Types: Cigarettes  . Smokeless tobacco:  Never Used  . Alcohol Use: No    Review of Systems  Constitutional: Negative for fever.  Eyes: Positive for pain (L eye). Negative for discharge, redness, itching and visual disturbance.  Neurological: Negative for headaches.    Allergies  Review of patient's allergies indicates no known allergies.  Home Medications   Current Outpatient Rx  Name  Route  Sig  Dispense  Refill  . famotidine (PEPCID) 40 MG tablet   Oral   Take 1 tablet (40 mg total) by mouth daily.   14 tablet   1   . gabapentin (NEURONTIN) 600 MG tablet   Oral   Take 600 mg by mouth at bedtime.         Marland Kitchen glipiZIDE (GLUCOTROL XL) 10 MG 24 hr tablet   Oral   Take 20 mg by mouth daily with breakfast.         . HYDROcodone-acetaminophen (NORCO/VICODIN) 5-325 MG per tablet   Oral   Take 1 tablet by mouth every 6 (six) hours as needed for moderate pain.   20 tablet   0   . lisinopril (PRINIVIL,ZESTRIL) 10 MG tablet   Oral   Take 10 mg by mouth daily.         . metFORMIN (GLUCOPHAGE) 1000 MG tablet   Oral   Take 2,000 mg by mouth daily with breakfast.         .  pravastatin (PRAVACHOL) 20 MG tablet   Oral   Take 20 mg by mouth at bedtime.         Marland Kitchen tobramycin (TOBREX) 0.3 % ophthalmic solution   Left Eye   Place 1 drop into the left eye every 4 (four) hours. Place one drop in the affected eye every 4 hours for 7 days   5 mL   0    BP 136/90  Pulse 97  Temp(Src) 97.8 F (36.6 C) (Oral)  Resp 18  SpO2 97%  Physical Exam  Nursing note and vitals reviewed. Constitutional: He is oriented to person, place, and time. He appears well-developed and well-nourished. No distress.  HENT:  Head: Normocephalic and atraumatic.  Right Ear: External ear normal.  Left Ear: External ear normal.  Mouth/Throat: Oropharynx is clear and moist. No oropharyngeal exudate.  Eyes: EOM are normal. Pupils are equal, round, and reactive to light. Right eye exhibits no discharge. Left eye exhibits no discharge.  No scleral icterus.  B/l conjunctival injection. Lash of upper eyelid on L side inverted toward cornea. No fluorescein uptake bilaterally; no evidence of corneal abrasion or ulcer. No pain with EOMs. Vision grossly intact; Snellen 20/30 OS, OD, and each eye. No foreign body appreciated on slit-lamp exam.  Neck: Normal range of motion. Neck supple.  Pulmonary/Chest: Effort normal. No respiratory distress.  Musculoskeletal: Normal range of motion.  Neurological: He is alert and oriented to person, place, and time.  GCS 15. Patient moves extremities without ataxia.  Skin: Skin is warm and dry. No rash noted. He is not diaphoretic. No erythema. No pallor.  Psychiatric: He has a normal mood and affect. His behavior is normal.    ED Course  FOREIGN BODY REMOVAL Date/Time: 04/14/2013 7:16 AM Performed by: Antonietta Breach Authorized by: Antonietta Breach Consent: Verbal consent obtained. written consent not obtained. The procedure was performed in an emergent situation. Risks and benefits: risks, benefits and alternatives were discussed Consent given by: patient Patient understanding: patient states understanding of the procedure being performed Patient consent: the patient's understanding of the procedure matches consent given Procedure consent: procedure consent matches procedure scheduled Relevant documents: relevant documents present and verified Test results: test results available and properly labeled Site marked: the operative site was marked Imaging studies: imaging studies available Required items: required blood products, implants, devices, and special equipment available Patient identity confirmed: verbally with patient and arm band Time out: Immediately prior to procedure a "time out" was called to verify the correct patient, procedure, equipment, support staff and site/side marked as required. Body area: eye Location details: left eyelid Patient sedated: no Patient restrained: no Patient  cooperative: yes Removal mechanism: forceps. Eye examined with fluorescein. No fluorescein uptake. Dressing: antibiotic drops Complexity: simple 1 objects recovered. Objects recovered: eyelash of upper eyelid Post-procedure assessment: foreign body removed Patient tolerance: Patient tolerated the procedure well with no immediate complications.   (including critical care time) Labs Review Labs Reviewed - No data to display  Imaging Review No results found.  EKG Interpretation   None       MDM   1. Trichiasis of left eyelid    Patient presents for a FB sensation in his L eye after a "spark flew in his eye" from a piece of metal his friend was welding. Patient has no pain with EOMs. Pupils equal round and reactive to direct and consensual light. No fluorescein uptake; no evidence of corneal abrasion or ulcer. No foreign body visualized on fundoscopic or slit-lamp  exam. Visual acuity and all visual fields intact. Patient does have an eyelash which appears to be growing inward towards the cornea which explains the irritation of his L eye; eyelash removed with some relief of symptoms. Still, will cover with Tobramycin and have patient f/u with ophthalmologist in AM. Referral provided. Return precautions discussed and patient agreeable to plan with no unaddressed concerns.   Filed Vitals:   04/09/13 0118 04/09/13 0303  BP: 136/90 134/78  Pulse: 97 86  Temp: 97.8 F (36.6 C)   TempSrc: Oral   Resp: 18 16  SpO2: 97% 100%     Antonietta Breach, PA-C 04/14/13 [phone]

## 2013-04-09 NOTE — ED Notes (Signed)
Pt. reports foreign object entered his left eye this evening while cutting metal , no blurred vision .

## 2013-04-13 ENCOUNTER — Encounter (HOSPITAL_COMMUNITY): Payer: Self-pay | Admitting: Emergency Medicine

## 2013-04-13 ENCOUNTER — Emergency Department (HOSPITAL_COMMUNITY)
Admission: EM | Admit: 2013-04-13 | Discharge: 2013-04-13 | Disposition: A | Discharge status: Home / Self Care | Payer: No Typology Code available for payment source | Attending: Emergency Medicine | Admitting: Emergency Medicine

## 2013-04-13 DIAGNOSIS — Z792 Long term (current) use of antibiotics: Secondary | ICD-10-CM | POA: Insufficient documentation

## 2013-04-13 DIAGNOSIS — M545 Low back pain, unspecified: Secondary | ICD-10-CM

## 2013-04-13 DIAGNOSIS — Z79899 Other long term (current) drug therapy: Secondary | ICD-10-CM | POA: Insufficient documentation

## 2013-04-13 DIAGNOSIS — I10 Essential (primary) hypertension: Secondary | ICD-10-CM | POA: Insufficient documentation

## 2013-04-13 DIAGNOSIS — F17200 Nicotine dependence, unspecified, uncomplicated: Secondary | ICD-10-CM | POA: Insufficient documentation

## 2013-04-13 DIAGNOSIS — M19049 Primary osteoarthritis, unspecified hand: Secondary | ICD-10-CM | POA: Insufficient documentation

## 2013-04-13 DIAGNOSIS — E119 Type 2 diabetes mellitus without complications: Secondary | ICD-10-CM | POA: Insufficient documentation

## 2013-04-13 DIAGNOSIS — G8929 Other chronic pain: Secondary | ICD-10-CM | POA: Insufficient documentation

## 2013-04-13 DIAGNOSIS — R32 Unspecified urinary incontinence: Secondary | ICD-10-CM | POA: Insufficient documentation

## 2013-04-13 MED ORDER — METHOCARBAMOL 500 MG PO TABS: 500.0000 mg | ORAL_TABLET | Freq: Two times a day (BID) | ORAL | Status: DC

## 2013-04-13 NOTE — ED Notes (Signed)
Pt given bus pass ?

## 2013-04-13 NOTE — ED Provider Notes (Signed)
Medical screening examination/treatment/procedure(s) were performed by non-physician practitioner and as supervising physician I was immediately available for consultation/collaboration.  Neta Ehlers, MD 04/13/13 [phone]

## 2013-04-13 NOTE — ED Provider Notes (Signed)
CSN: [id number]     Arrival date & time 04/13/13  2044 History  This chart was scribed for David Moras, PA working with Neta Ehlers, MD by Roxan Diesel, ED Scribe. This patient was seen in room TR09C/TR09C and the patient's care was started at 10:14 PM.   Chief Complaint  Patient presents with  . Back Pain    The history is provided by the patient. No language interpreter was used.    HPI Comments: David Wyatt is a 53 y.o. male who presents to the Emergency Department complaining of one day of "tight" right lower back pain.  Pt states he has a "sciatic nerve problem" with chronic associated left lower back pain radiating into his leg, but today he developed a separate pain described as a "tightness" in his right lower back that is worsened by deep breathing and standing up.  He has not attempted to treat pain today.  Pt also notes one episode of urge incontinence today.  He states that he felt the urge to go and was trying to hold his bladder but could not hold it and urinated on himself.  He denies prior experience of incontinence.  He denies fevers, chills, dysuria, hematuria, SOB, lightheadedness, dizziness, or hemoptysis.  He is able to walk.  Pt states that he does not receive regular care for his chronic back pain.  He states he has been referred to an orthopedist but he has not been able to secure an appointment.   Past Medical History  Diagnosis Date  . Hypertension   . Cocaine abuse   . Type II diabetes mellitus   . Arthritis     "hands" (07/28/2012)  . Angioedema of lips 07/28/2012    left upper (07/29/2012)    Past Surgical History  Procedure Laterality Date  . Inguinal hernia repair  04/01/2012    Procedure: HERNIA REPAIR INGUINAL ADULT;  Surgeon: Haywood Lasso, MD;  Location: [place];  Service: General;  Laterality: Right;  . Hernia repair Right 04/01/2012  . Incision and drainage of wound      boil on back/notes 07/14/2008 (07/29/2012)  . I&d extremity Left  12/13/2012    Procedure: IRRIGATION AND DEBRIDEMENT LEFT THUMB;  Surgeon: Tennis Must, MD;  Location: [place];  Service: Orthopedics;  Laterality: Left;    Family History  Problem Relation Age of Onset  . Diabetes Father     History  Substance Use Topics  . Smoking status: Current Every Day Smoker -- 0.25 packs/day for 30 years    Types: Cigarettes  . Smokeless tobacco: Never Used  . Alcohol Use: No     Review of Systems  Constitutional: Negative for fever and chills.  Respiratory: Negative for shortness of breath.   Genitourinary: Negative for dysuria and hematuria.       Urge incontinence  Musculoskeletal: Positive for back pain.  Neurological: Negative for dizziness and light-headedness.     Allergies  Review of patient's allergies indicates no known allergies.  Home Medications   Current Outpatient Rx  Name  Route  Sig  Dispense  Refill  . famotidine (PEPCID) 40 MG tablet   Oral   Take 1 tablet (40 mg total) by mouth daily.   14 tablet   1   . gabapentin (NEURONTIN) 600 MG tablet   Oral   Take 600 mg by mouth at bedtime.         Marland Kitchen glipiZIDE (GLUCOTROL XL) 10 MG 24 hr tablet   Oral  Take 20 mg by mouth daily with breakfast.         . HYDROcodone-acetaminophen (NORCO/VICODIN) 5-325 MG per tablet   Oral   Take 1 tablet by mouth every 6 (six) hours as needed for moderate pain.   20 tablet   0   . lisinopril (PRINIVIL,ZESTRIL) 10 MG tablet   Oral   Take 10 mg by mouth daily.         . metFORMIN (GLUCOPHAGE) 1000 MG tablet   Oral   Take 2,000 mg by mouth daily with breakfast.         . pravastatin (PRAVACHOL) 20 MG tablet   Oral   Take 20 mg by mouth at bedtime.         Marland Kitchen tobramycin (TOBREX) 0.3 % ophthalmic solution   Left Eye   Place 1 drop into the left eye every 4 (four) hours. Place one drop in the affected eye every 4 hours for 7 days   5 mL   0    BP 122/78  Pulse 101  Temp(Src) 97.7 F (36.5 C) (Oral)  Resp 16  SpO2  94%  Physical Exam  Nursing note and vitals reviewed. Constitutional: He is oriented to person, place, and time. He appears well-developed and well-nourished. No distress.  HENT:  Head: Normocephalic and atraumatic.  Eyes: EOM are normal.  Neck: Neck supple. No tracheal deviation present.  Cardiovascular: Normal rate.   Pulmonary/Chest: Effort normal and breath sounds normal. No respiratory distress. He has no wheezes. He has no rales.  Musculoskeletal: Normal range of motion. He exhibits tenderness.  Right paralumbar tenderness, no midline spinal tenderness. No overlying skin changes.  Normal flexion.  Negative straight leg raise.  Neurological: He is alert and oriented to person, place, and time.  Skin: Skin is warm and dry.  Psychiatric: He has a normal mood and affect. His behavior is normal.    ED Course  Procedures (including critical care time)  DIAGNOSTIC STUDIES: Oxygen Saturation is 94% on room air, adequate by my interpretation.    COORDINATION OF CARE: 10:23 PM-low back pain, no red flags.  Likely MSK strain.  Is afebrile, VSS, able to ambulate. Is NVI.  Discussed treatment plan which includes referral to orthopedist with pt at bedside and pt agreed to plan.   Labs Review Labs Reviewed - No data to display  Imaging Review No results found.  EKG Interpretation   None       MDM   1. Low back pain    BP 122/78  Pulse 101  Temp(Src) 97.7 F (36.5 C) (Oral)  Resp 16  SpO2 94%  I personally performed the services described in this documentation, which was scribed in my presence. The recorded information has been reviewed and is accurate.     David Moras, PA-C 04/13/13 2234

## 2013-04-13 NOTE — Discharge Instructions (Signed)
Back Pain, Adult Low back pain is very common. About 1 in 5 people have back pain.The cause of low back pain is rarely dangerous. The pain often gets better over time.About half of people with a sudden onset of back pain feel better in just 2 weeks. About 8 in 10 people feel better by 6 weeks.  CAUSES Some common causes of back pain include:  Strain of the muscles or ligaments supporting the spine.  Wear and tear (degeneration) of the spinal discs.  Arthritis.  Direct injury to the back. DIAGNOSIS Most of the time, the direct cause of low back pain is not known.However, back pain can be treated effectively even when the exact cause of the pain is unknown.Answering your caregiver's questions about your overall health and symptoms is one of the most accurate ways to make sure the cause of your pain is not dangerous. If your caregiver needs more information, he or she may order lab work or imaging tests (X-rays or MRIs).However, even if imaging tests show changes in your back, this usually does not require surgery. HOME CARE INSTRUCTIONS For many people, back pain returns.Since low back pain is rarely dangerous, it is often a condition that people can learn to manageon their own.   Remain active. It is stressful on the back to sit or stand in one place. Do not sit, drive, or stand in one place for more than 30 minutes at a time. Take short walks on level surfaces as soon as pain allows.Try to increase the length of time you walk each day.  Do not stay in bed.Resting more than 1 or 2 days can delay your recovery.  Do not avoid exercise or work.Your body is made to move.It is not dangerous to be active, even though your back may hurt.Your back will likely heal faster if you return to being active before your pain is gone.  Pay attention to your body when you bend and lift. Many people have less discomfortwhen lifting if they bend their knees, keep the load close to their bodies,and  avoid twisting. Often, the most comfortable positions are those that put less stress on your recovering back.  Find a comfortable position to sleep. Use a firm mattress and lie on your side with your knees slightly bent. If you lie on your back, put a pillow under your knees.  Only take over-the-counter or prescription medicines as directed by your caregiver. Over-the-counter medicines to reduce pain and inflammation are often the most helpful.Your caregiver may prescribe muscle relaxant drugs.These medicines help dull your pain so you can more quickly return to your normal activities and healthy exercise.  Put ice on the injured area.  Put ice in a plastic bag.  Place a towel between your skin and the bag.  Leave the ice on for 15-20 minutes, 03-04 times a day for the first 2 to 3 days. After that, ice and heat may be alternated to reduce pain and spasms.  Ask your caregiver about trying back exercises and gentle massage. This may be of some benefit.  Avoid feeling anxious or stressed.Stress increases muscle tension and can worsen back pain.It is important to recognize when you are anxious or stressed and learn ways to manage it.Exercise is a great option. SEEK MEDICAL CARE IF:  You have pain that is not relieved with rest or medicine.  You have pain that does not improve in 1 week.  You have new symptoms.  You are generally not feeling well. SEEK   IMMEDIATE MEDICAL CARE IF:   You have pain that radiates from your back into your legs.  You develop new bowel or bladder control problems.  You have unusual weakness or numbness in your arms or legs.  You develop nausea or vomiting.  You develop abdominal pain.  You feel faint. Document Released: 02/25/2005 Document Revised: 08/27/2011 Document Reviewed: 07/16/2010 [employer] Patient Information 2014 [employer].  

## 2013-04-13 NOTE — ED Notes (Signed)
Pt. reports chronic low back pain for several months worse these past several days , denies injury or fall , pt. stated history of sciatica. Denies urinary incontinence, ambulatory.

## 2013-04-13 NOTE — ED Notes (Signed)
MD at bedside. 

## 2013-04-14 ENCOUNTER — Emergency Department (HOSPITAL_COMMUNITY)
Admission: EM | Admit: 2013-04-14 | Discharge: 2013-04-14 | Disposition: A | Discharge status: Home / Self Care | Payer: No Typology Code available for payment source | Attending: Emergency Medicine | Admitting: Emergency Medicine

## 2013-04-14 ENCOUNTER — Encounter (HOSPITAL_COMMUNITY): Payer: Self-pay | Admitting: Emergency Medicine

## 2013-04-14 DIAGNOSIS — M545 Low back pain, unspecified: Secondary | ICD-10-CM | POA: Insufficient documentation

## 2013-04-14 DIAGNOSIS — E119 Type 2 diabetes mellitus without complications: Secondary | ICD-10-CM | POA: Insufficient documentation

## 2013-04-14 DIAGNOSIS — F1410 Cocaine abuse, uncomplicated: Secondary | ICD-10-CM | POA: Insufficient documentation

## 2013-04-14 DIAGNOSIS — M19049 Primary osteoarthritis, unspecified hand: Secondary | ICD-10-CM | POA: Insufficient documentation

## 2013-04-14 DIAGNOSIS — Z79899 Other long term (current) drug therapy: Secondary | ICD-10-CM | POA: Insufficient documentation

## 2013-04-14 DIAGNOSIS — I10 Essential (primary) hypertension: Secondary | ICD-10-CM | POA: Insufficient documentation

## 2013-04-14 DIAGNOSIS — F17200 Nicotine dependence, unspecified, uncomplicated: Secondary | ICD-10-CM | POA: Insufficient documentation

## 2013-04-14 MED ORDER — METHOCARBAMOL 500 MG PO TABS
500.0000 mg | ORAL_TABLET | Freq: Once | ORAL | Status: AC
  Administered 2013-04-14: 500 mg via ORAL
  Filled 2013-04-14: qty 1

## 2013-04-14 MED ORDER — HYDROCODONE-ACETAMINOPHEN 5-325 MG PO TABS
1.0000 | ORAL_TABLET | Freq: Once | ORAL | Status: AC
  Administered 2013-04-14: 1 via ORAL
  Filled 2013-04-14: qty 1

## 2013-04-14 NOTE — ED Provider Notes (Signed)
CSN: [id number]     Arrival date & time 04/14/13  0502 History   First MD Initiated Contact with Patient 04/14/13 0518     Chief Complaint  Patient presents with  . Back Pain   (Consider location/radiation/quality/duration/timing/severity/associated sxs/prior Treatment) HPI Comments: Patient was just released from [facility] emergency department, with prescription for Robaxin, which he has not filled yet been seen several times for low back pain.  Has been referred to orthopedics on several occasions he states he has been unable to make an appointment because the, doctors.  He's been referred to state they haven't been on call and  will not give him an appointment. He denies any new symptoms, but persistent low back pain, radiating to his posterior left hip and leg  Patient is a 53 y.o. male presenting with back pain. The history is provided by the patient.  Back Pain Location:  Lumbar spine Quality:  Aching Radiates to:  L posterior upper leg Pain severity:  Severe Pain is:  Unable to specify Onset quality:  Unable to specify Timing:  Constant Progression:  Worsening Chronicity:  Recurrent Relieved by:  None tried Worsened by:  Ambulation Associated symptoms: no chest pain, no fever, no numbness and no weakness     Past Medical History  Diagnosis Date  . Hypertension   . Cocaine abuse   . Type II diabetes mellitus   . Arthritis     "hands" (07/28/2012)  . Angioedema of lips 07/28/2012    left upper (07/29/2012)   Past Surgical History  Procedure Laterality Date  . Inguinal hernia repair  04/01/2012    Procedure: HERNIA REPAIR INGUINAL ADULT;  Surgeon: Haywood Lasso, MD;  Location: [place];  Service: General;  Laterality: Right;  . Hernia repair Right 04/01/2012  . Incision and drainage of wound      boil on back/notes 07/14/2008 (07/29/2012)  . I&d extremity Left 12/13/2012    Procedure: IRRIGATION AND DEBRIDEMENT LEFT THUMB;  Surgeon: Tennis Must, MD;  Location: [place];   Service: Orthopedics;  Laterality: Left;   Family History  Problem Relation Age of Onset  . Diabetes Father    History  Substance Use Topics  . Smoking status: Current Every Day Smoker -- 0.25 packs/day for 30 years    Types: Cigarettes  . Smokeless tobacco: Never Used  . Alcohol Use: No    Review of Systems  Constitutional: Negative for fever.  Respiratory: Negative for cough.   Cardiovascular: Negative for chest pain.  Musculoskeletal: Positive for back pain.  Skin: Negative for wound.  Neurological: Negative for dizziness, weakness and numbness.  All other systems reviewed and are negative.    Allergies  Review of patient's allergies indicates no known allergies.  Home Medications   Current Outpatient Rx  Name  Route  Sig  Dispense  Refill  . famotidine (PEPCID) 40 MG tablet   Oral   Take 1 tablet (40 mg total) by mouth daily.   14 tablet   1   . gabapentin (NEURONTIN) 600 MG tablet   Oral   Take 600 mg by mouth at bedtime.         Marland Kitchen glipiZIDE (GLUCOTROL XL) 10 MG 24 hr tablet   Oral   Take 20 mg by mouth daily with breakfast.         . HYDROcodone-acetaminophen (NORCO/VICODIN) 5-325 MG per tablet   Oral   Take 1 tablet by mouth every 6 (six) hours as needed for moderate pain.  20 tablet   0   . lisinopril (PRINIVIL,ZESTRIL) 10 MG tablet   Oral   Take 10 mg by mouth daily.         . metFORMIN (GLUCOPHAGE) 1000 MG tablet   Oral   Take 2,000 mg by mouth daily with breakfast.         . methocarbamol (ROBAXIN) 500 MG tablet   Oral   Take 1 tablet (500 mg total) by mouth 2 (two) times daily.   20 tablet   0   . pravastatin (PRAVACHOL) 20 MG tablet   Oral   Take 20 mg by mouth at bedtime.          BP 129/64  Pulse 76  Temp(Src) 97.8 F (36.6 C) (Oral)  SpO2 97% Physical Exam  Nursing note and vitals reviewed. Constitutional: He appears well-developed and well-nourished.  HENT:  Head: Normocephalic.  Neck: Normal range of motion.   Cardiovascular: Normal rate.   Pulmonary/Chest: Effort normal.  Musculoskeletal: Normal range of motion. He exhibits tenderness. He exhibits no edema.       Back:  Neurological: He is alert.  Skin: Skin is warm.    ED Course  Procedures (including critical care time) Labs Review Labs Reviewed - No data to display Imaging Review No results found.  EKG Interpretation   None       MDM  No diagnosis found.  Will give patient one dose of Robaxin, and Vicodin in the emergency department, and allow him to fill his prescription and make followup appointments    Garald Balding, NP 04/14/13 [phone]

## 2013-04-14 NOTE — ED Notes (Signed)
Patient was seen 5 hours ago for back pain. Has not been able to fill prescription. Here stating pain is still there.

## 2013-04-14 NOTE — ED Provider Notes (Signed)
Medical screening examination/treatment/procedure(s) were performed by non-physician practitioner and as supervising physician I was immediately available for consultation/collaboration.  EKG Interpretation   None         Sharyon Cable, MD 04/14/13 [phone]

## 2013-04-14 NOTE — Discharge Instructions (Signed)
Please fill your prescription, make an appointment for follow up evaluation

## 2013-04-18 NOTE — ED Provider Notes (Signed)
Medical screening examination/treatment/procedure(s) were performed by non-physician practitioner and as supervising physician I was immediately available for consultation/collaboration.  EKG Interpretation   None         Mirna Mires, MD 04/18/13 [phone]

## 2013-05-05 ENCOUNTER — Emergency Department (HOSPITAL_COMMUNITY)
Admission: EM | Admit: 2013-05-05 | Discharge: 2013-05-05 | Disposition: A | Discharge status: Home / Self Care | Payer: No Typology Code available for payment source | Attending: Emergency Medicine | Admitting: Emergency Medicine

## 2013-05-05 ENCOUNTER — Encounter (HOSPITAL_COMMUNITY): Payer: Self-pay | Admitting: Emergency Medicine

## 2013-05-05 DIAGNOSIS — A084 Viral intestinal infection, unspecified: Secondary | ICD-10-CM | POA: Diagnosis present

## 2013-05-05 DIAGNOSIS — E1169 Type 2 diabetes mellitus with other specified complication: Secondary | ICD-10-CM | POA: Insufficient documentation

## 2013-05-05 DIAGNOSIS — Z79899 Other long term (current) drug therapy: Secondary | ICD-10-CM | POA: Insufficient documentation

## 2013-05-05 DIAGNOSIS — A088 Other specified intestinal infections: Secondary | ICD-10-CM | POA: Insufficient documentation

## 2013-05-05 DIAGNOSIS — E162 Hypoglycemia, unspecified: Secondary | ICD-10-CM | POA: Diagnosis present

## 2013-05-05 DIAGNOSIS — I10 Essential (primary) hypertension: Secondary | ICD-10-CM | POA: Insufficient documentation

## 2013-05-05 DIAGNOSIS — Z9889 Other specified postprocedural states: Secondary | ICD-10-CM | POA: Insufficient documentation

## 2013-05-05 DIAGNOSIS — F17200 Nicotine dependence, unspecified, uncomplicated: Secondary | ICD-10-CM | POA: Insufficient documentation

## 2013-05-05 DIAGNOSIS — Z8739 Personal history of other diseases of the musculoskeletal system and connective tissue: Secondary | ICD-10-CM | POA: Insufficient documentation

## 2013-05-05 LAB — COMPREHENSIVE METABOLIC PANEL
ALBUMIN: 3.5 g/dL (ref 3.5–5.2)
ALT: 17 U/L (ref 0–53)
AST: 20 U/L (ref 0–37)
Alkaline Phosphatase: 74 U/L (ref 39–117)
BUN: 21 mg/dL (ref 6–23)
CALCIUM: 9.4 mg/dL (ref 8.4–10.5)
CHLORIDE: 99 meq/L (ref 96–112)
CO2: 20 mEq/L (ref 19–32)
CREATININE: 1.13 mg/dL (ref 0.50–1.35)
GFR calc Af Amer: 84 mL/min — ABNORMAL LOW (ref >90)
GFR calc non Af Amer: 73 mL/min — ABNORMAL LOW (ref >90)
Glucose, Bld: 143 mg/dL — ABNORMAL HIGH (ref 70–99)
Potassium: 4.7 mEq/L (ref 3.7–5.3)
Sodium: 135 mEq/L — ABNORMAL LOW (ref 137–147)
TOTAL PROTEIN: 8.1 g/dL (ref 6.0–8.3)
Total Bilirubin: 0.6 mg/dL (ref 0.3–1.2)

## 2013-05-05 LAB — CBC WITH DIFFERENTIAL/PLATELET
BASOS ABS: 0 10*3/uL (ref 0.0–0.1)
BASOS PCT: 0 % (ref 0–1)
EOS ABS: 0.3 10*3/uL (ref 0.0–0.7)
EOS PCT: 3 % (ref 0–5)
HEMATOCRIT: 41 % (ref 39.0–52.0)
Hemoglobin: 13.8 g/dL (ref 13.0–17.0)
Lymphocytes Relative: 22 % (ref 12–46)
Lymphs Abs: 2 10*3/uL (ref 0.7–4.0)
MCH: 31.8 pg (ref 26.0–34.0)
MCHC: 33.7 g/dL (ref 30.0–36.0)
MCV: 94.5 fL (ref 78.0–100.0)
MONO ABS: 0.6 10*3/uL (ref 0.1–1.0)
Monocytes Relative: 7 % (ref 3–12)
NEUTROS ABS: 6.1 10*3/uL (ref 1.7–7.7)
Neutrophils Relative %: 68 % (ref 43–77)
Platelets: 373 10*3/uL (ref 150–400)
RBC: 4.34 MIL/uL (ref 4.22–5.81)
RDW: 13.2 % (ref 11.5–15.5)
WBC: 9 10*3/uL (ref 4.0–10.5)

## 2013-05-05 LAB — CBG MONITORING, ED
GLUCOSE-CAPILLARY: 55 mg/dL — AB (ref 70–99)
GLUCOSE-CAPILLARY: 125 mg/dL — AB (ref 70–99)
Glucose-Capillary: 138 mg/dL — ABNORMAL HIGH (ref 70–99)

## 2013-05-05 LAB — LIPASE, BLOOD: Lipase: 29 U/L (ref 11–59)

## 2013-05-05 MED ORDER — ONDANSETRON HCL 4 MG/2ML IJ SOLN
4.0000 mg | Freq: Once | INTRAMUSCULAR | Status: AC
  Administered 2013-05-05: 4 mg via INTRAVENOUS
  Filled 2013-05-05: qty 2

## 2013-05-05 MED ORDER — SODIUM CHLORIDE 0.9 % IV BOLUS (SEPSIS)
1000.0000 mL | INTRAVENOUS | Status: AC
  Administered 2013-05-05: 1000 mL via INTRAVENOUS

## 2013-05-05 NOTE — ED Notes (Signed)
Bed: WA18 Expected date:  Expected time:  Means of arrival:  Comments: EMS 

## 2013-05-05 NOTE — ED Notes (Signed)
He mentions having an umbilical hernia; and he underwent right inguinal herniorrhaphy last year.

## 2013-05-05 NOTE — ED Notes (Signed)
He is asymptomatic with his current cbg of 24.  We obtain for him a snack and orange juice which he consumes with alacrity.

## 2013-05-05 NOTE — ED Notes (Signed)
Spoke with staff at [facility], patient with bed in facility. [employer] agrees to provide transportation to said facility. Patient updated.

## 2013-05-05 NOTE — ED Notes (Signed)
He was at a local shelter.  EMS were called as he began to have occasional bouts of n/v/d since this morning.  They found him to have a blood sugar of 68 a short while ago--they called EMS and gave him oral glucose.  He is in no distress.

## 2013-05-05 NOTE — ED Provider Notes (Signed)
CSN: [id number]     Arrival date & time 05/05/13  1831 History   First MD Initiated Contact with Patient 05/05/13 2055     No chief complaint on file.    (Consider location/radiation/quality/duration/timing/severity/associated sxs/prior Treatment) Patient is a 53 y.o. male presenting with vomiting. The history is provided by the patient.  Emesis Severity:  Moderate Duration:  4 hours Timing:  Constant Quality:  Stomach contents Able to tolerate:  Liquids and solids Progression:  Resolved Chronicity:  New Recent urination:  Normal Relieved by:  None tried Worsened by:  Nothing tried Ineffective treatments:  None tried Associated symptoms: abdominal pain and diarrhea   Associated symptoms: no headaches   Abdominal pain:    Location:  Generalized   Quality:  Cramping   Severity:  Mild   Onset quality:  Sudden   Duration:  4 hours   Timing:  Constant   Progression:  Resolved   Chronicity:  New Diarrhea:    Quality:  Watery   Severity:  Moderate   Duration:  4 hours   Timing:  Constant   Progression:  Resolved   Past Medical History  Diagnosis Date  . Hypertension   . Cocaine abuse   . Type II diabetes mellitus   . Arthritis     "hands" (07/28/2012)  . Angioedema of lips 07/28/2012    left upper (07/29/2012)   Past Surgical History  Procedure Laterality Date  . Inguinal hernia repair  04/01/2012    Procedure: HERNIA REPAIR INGUINAL ADULT;  Surgeon: Haywood Lasso, MD;  Location: [facility];  Service: General;  Laterality: Right;  . Hernia repair Right 04/01/2012  . Incision and drainage of wound      boil on back/notes 07/14/2008 (07/29/2012)  . I&d extremity Left 12/13/2012    Procedure: IRRIGATION AND DEBRIDEMENT LEFT THUMB;  Surgeon: Tennis Must, MD;  Location: [facility];  Service: Orthopedics;  Laterality: Left;   Family History  Problem Relation Age of Onset  . Diabetes Father    History  Substance Use Topics  . Smoking status: Current Every Day Smoker -- 0.25  packs/day for 30 years    Types: Cigarettes  . Smokeless tobacco: Never Used  . Alcohol Use: No    Review of Systems  Constitutional: Negative for fever.  HENT: Negative for drooling and rhinorrhea.   Eyes: Negative for pain.  Respiratory: Negative for cough and shortness of breath.   Cardiovascular: Negative for chest pain and leg swelling.  Gastrointestinal: Positive for nausea, vomiting, abdominal pain and diarrhea.  Genitourinary: Negative for dysuria and hematuria.  Musculoskeletal: Negative for gait problem and neck pain.  Skin: Negative for color change.  Neurological: Negative for numbness and headaches.  Hematological: Negative for adenopathy.  Psychiatric/Behavioral: Negative for behavioral problems.  All other systems reviewed and are negative.      Allergies  Review of patient's allergies indicates no known allergies.  Home Medications   Current Outpatient Rx  Name  Route  Sig  Dispense  Refill  . esomeprazole (NEXIUM) 20 MG capsule   Oral   Take 20 mg by mouth daily at 12 noon.         . famotidine (PEPCID) 40 MG tablet   Oral   Take 1 tablet (40 mg total) by mouth daily.   14 tablet   1   . gabapentin (NEURONTIN) 300 MG capsule   Oral   Take 300 mg by mouth 3 (three) times daily.         Marland Kitchen  gabapentin (NEURONTIN) 600 MG tablet   Oral   Take 600 mg by mouth at bedtime.         Marland Kitchen glipiZIDE (GLUCOTROL XL) 10 MG 24 hr tablet   Oral   Take 20 mg by mouth daily with breakfast.         . lisinopril (PRINIVIL,ZESTRIL) 10 MG tablet   Oral   Take 10 mg by mouth daily.         . metFORMIN (GLUCOPHAGE) 1000 MG tablet   Oral   Take 2,000 mg by mouth daily with breakfast.         . methocarbamol (ROBAXIN) 500 MG tablet   Oral   Take 1 tablet (500 mg total) by mouth 2 (two) times daily.   20 tablet   0   . pravastatin (PRAVACHOL) 20 MG tablet   Oral   Take 20 mg by mouth at bedtime.         . sitaGLIPtin (JANUVIA) 100 MG tablet    Oral   Take 100 mg by mouth daily.          BP 126/71  Pulse 86  Temp(Src) 99.3 F (37.4 C) (Oral)  Resp 14  SpO2 91% Physical Exam  Nursing note and vitals reviewed. Constitutional: He is oriented to person, place, and time. He appears well-developed and well-nourished.  HENT:  Head: Normocephalic and atraumatic.  Right Ear: External ear normal.  Left Ear: External ear normal.  Nose: Nose normal.  Mouth/Throat: Oropharynx is clear and moist. No oropharyngeal exudate.  Eyes: Conjunctivae and EOM are normal. Pupils are equal, round, and reactive to light.  Neck: Normal range of motion. Neck supple.  Cardiovascular: Normal rate, regular rhythm, normal heart sounds and intact distal pulses.  Exam reveals no gallop and no friction rub.   No murmur heard. Pulmonary/Chest: Effort normal and breath sounds normal. No respiratory distress. He has no wheezes.  Abdominal: Soft. Bowel sounds are normal. He exhibits no distension. There is no tenderness. There is no rebound and no guarding.  Mild umbilical hernia which is soft and easily reduced.  Musculoskeletal: Normal range of motion. He exhibits no edema and no tenderness.  Neurological: He is alert and oriented to person, place, and time.  Skin: Skin is warm and dry.  Psychiatric: He has a normal mood and affect. His behavior is normal.    ED Course  Procedures (including critical care time) Labs Review Labs Reviewed  COMPREHENSIVE METABOLIC PANEL - Abnormal; Notable for the following:    Sodium 135 (*)    Glucose, Bld 143 (*)    GFR calc non Af Amer 73 (*)    GFR calc Af Amer 84 (*)    All other components within normal limits  CBG MONITORING, ED - Abnormal; Notable for the following:    Glucose-Capillary 55 (*)    All other components within normal limits  CBG MONITORING, ED - Abnormal; Notable for the following:    Glucose-Capillary 125 (*)    All other components within normal limits  CBG MONITORING, ED - Abnormal;  Notable for the following:    Glucose-Capillary 138 (*)    All other components within normal limits  CBC WITH DIFFERENTIAL  LIPASE, BLOOD   Imaging Review No results found.  EKG Interpretation   None       MDM   Final diagnoses:  Hypoglycemia  Viral gastroenteritis    9:06 PM 53 y.o. male who pw N/V/D and abd cramping which began  this morning. The patient was at a local shelter. EMS was called and found his blood sugar to be 68. He was given oral glucose and was brought to the ER for evaluation. He is asymptomatic on my exam and eating crackers and peanut butter. He is feeling much better and denies any nausea, vomiting, diarrhea, or abdominal pain currently. He is hungry. Will get screening labwork, IV fluids, and food.  10:23 PM: Likely viral gastroenteritis. Tolerating po, abd benign. Labs unremarkable. BS remains normal.   I have discussed the diagnosis/risks/treatment options with the patient and believe the pt to be eligible for discharge home to follow-up with pcp as needed. We also discussed returning to the ED immediately if new or worsening sx occur. We discussed the sx which are most concerning (e.g., inability to tolerate po, fever, worsening abd pain) that necessitate immediate return. Medications administered to the patient during their visit and any new prescriptions provided to the patient are listed below.  Medications given during this visit Medications  sodium chloride 0.9 % bolus 1,000 mL (0 mLs Intravenous Stopped 05/05/13 2216)    New Prescriptions   No medications on file     Blanchard Kelch, MD 05/06/13 [phone]

## 2013-05-13 ENCOUNTER — Ambulatory Visit (HOSPITAL_COMMUNITY)
Admission: RE | Admit: 2013-05-13 | Discharge: 2013-05-13 | Disposition: A | Discharge status: Home / Self Care | Payer: No Typology Code available for payment source | Source: Ambulatory Visit | Attending: Orthopedic Surgery | Admitting: Orthopedic Surgery

## 2013-05-13 ENCOUNTER — Other Ambulatory Visit (HOSPITAL_COMMUNITY): Payer: Self-pay | Admitting: Orthopedic Surgery

## 2013-05-13 DIAGNOSIS — M545 Low back pain, unspecified: Secondary | ICD-10-CM

## 2013-05-13 DIAGNOSIS — Z1389 Encounter for screening for other disorder: Secondary | ICD-10-CM | POA: Insufficient documentation

## 2013-05-13 DIAGNOSIS — Z77018 Contact with and (suspected) exposure to other hazardous metals: Secondary | ICD-10-CM | POA: Insufficient documentation

## 2013-05-22 ENCOUNTER — Emergency Department (INDEPENDENT_AMBULATORY_CARE_PROVIDER_SITE_OTHER)
Admission: EM | Admit: 2013-05-22 | Discharge: 2013-05-22 | Disposition: A | Discharge status: Home / Self Care | Payer: No Typology Code available for payment source | Source: Home / Self Care

## 2013-05-22 ENCOUNTER — Encounter (HOSPITAL_COMMUNITY): Payer: Self-pay | Admitting: Emergency Medicine

## 2013-05-22 DIAGNOSIS — M5430 Sciatica, unspecified side: Secondary | ICD-10-CM

## 2013-05-22 DIAGNOSIS — M5432 Sciatica, left side: Secondary | ICD-10-CM

## 2013-05-22 DIAGNOSIS — M549 Dorsalgia, unspecified: Secondary | ICD-10-CM

## 2013-05-22 DIAGNOSIS — G8929 Other chronic pain: Secondary | ICD-10-CM

## 2013-05-22 MED ORDER — HYDROCODONE-ACETAMINOPHEN 5-325 MG PO TABS: 1.0000 | ORAL_TABLET | ORAL | Status: DC | PRN

## 2013-05-22 MED ORDER — PREDNISONE 20 MG PO TABS: ORAL_TABLET | ORAL | Status: DC

## 2013-05-22 MED ORDER — TRAMADOL HCL 50 MG PO TABS: 50.0000 mg | ORAL_TABLET | Freq: Four times a day (QID) | ORAL | Status: DC | PRN

## 2013-05-22 NOTE — Discharge Instructions (Signed)
Chronic Back Pain  When back pain lasts longer than 3 months, it is called chronic back pain.People with chronic back pain often go through certain periods that are more intense (flare-ups).  CAUSES Chronic back pain can be caused by wear and tear (degeneration) on different structures in your back. These structures include:  The bones of your spine (vertebrae) and the joints surrounding your spinal cord and nerve roots (facets).  The strong, fibrous tissues that connect your vertebrae (ligaments). Degeneration of these structures may result in pressure on your nerves. This can lead to constant pain. HOME CARE INSTRUCTIONS  Avoid bending, heavy lifting, prolonged sitting, and activities which make the problem worse.  Take brief periods of rest throughout the day to reduce your pain. Lying down or standing usually is better than sitting while you are resting.  Take over-the-counter or prescription medicines only as directed by your caregiver. SEEK IMMEDIATE MEDICAL CARE IF:   You have weakness or numbness in one of your legs or feet.  You have trouble controlling your bladder or bowels.  You have nausea, vomiting, abdominal pain, shortness of breath, or fainting. Document Released: 04/04/2004 Document Revised: 05/20/2011 Document Reviewed: 02/09/2011 [facility] Patient Information 2014 [place], [place].  Sciatica Sciatica is pain, weakness, numbness, or tingling along the path of the sciatic nerve. The nerve starts in the lower back and runs down the back of each leg. The nerve controls the muscles in the lower leg and in the back of the knee, while also providing sensation to the back of the thigh, lower leg, and the sole of your foot. Sciatica is a symptom of another medical condition. For instance, nerve damage or certain conditions, such as a herniated disk or bone spur on the spine, pinch or put pressure on the sciatic nerve. This causes the pain, weakness, or other sensations normally  associated with sciatica. Generally, sciatica only affects one side of the body. CAUSES   Herniated or slipped disc.  Degenerative disk disease.  A pain disorder involving the narrow muscle in the buttocks (piriformis syndrome).  Pelvic injury or fracture.  Pregnancy.  Tumor (rare). SYMPTOMS  Symptoms can vary from mild to very severe. The symptoms usually travel from the low back to the buttocks and down the back of the leg. Symptoms can include:  Mild tingling or dull aches in the lower back, leg, or hip.  Numbness in the back of the calf or sole of the foot.  Burning sensations in the lower back, leg, or hip.  Sharp pains in the lower back, leg, or hip.  Leg weakness.  Severe back pain inhibiting movement. These symptoms may get worse with coughing, sneezing, laughing, or prolonged sitting or standing. Also, being overweight may worsen symptoms. DIAGNOSIS  Your caregiver will perform a physical exam to look for common symptoms of sciatica. He or she may ask you to do certain movements or activities that would trigger sciatic nerve pain. Other tests may be performed to find the cause of the sciatica. These may include:  Blood tests.  X-rays.  Imaging tests, such as an MRI or CT scan. TREATMENT  Treatment is directed at the cause of the sciatic pain. Sometimes, treatment is not necessary and the pain and discomfort goes away on its own. If treatment is needed, your caregiver may suggest:  Over-the-counter medicines to relieve pain.  Prescription medicines, such as anti-inflammatory medicine, muscle relaxants, or narcotics.  Applying heat or ice to the painful area.  Steroid injections to lessen  pain, irritation, and inflammation around the nerve.  Reducing activity during periods of pain.  Exercising and stretching to strengthen your abdomen and improve flexibility of your spine. Your caregiver may suggest losing weight if the extra weight makes the back pain  worse.  Physical therapy.  Surgery to eliminate what is pressing or pinching the nerve, such as a bone spur or part of a herniated disk. HOME CARE INSTRUCTIONS   Only take over-the-counter or prescription medicines for pain or discomfort as directed by your caregiver.  Apply ice to the affected area for 20 minutes, 3 4 times a day for the first 48 72 hours. Then try heat in the same way.  Exercise, stretch, or perform your usual activities if these do not aggravate your pain.  Attend physical therapy sessions as directed by your caregiver.  Keep all follow-up appointments as directed by your caregiver.  Do not wear high heels or shoes that do not provide proper support.  Check your mattress to see if it is too soft. A firm mattress may lessen your pain and discomfort. SEEK IMMEDIATE MEDICAL CARE IF:   You lose control of your bowel or bladder (incontinence).  You have increasing weakness in the lower back, pelvis, buttocks, or legs.  You have redness or swelling of your back.  You have a burning sensation when you urinate.  You have pain that gets worse when you lie down or awakens you at night.  Your pain is worse than you have experienced in the past.  Your pain is lasting longer than 4 weeks.  You are suddenly losing weight without reason. MAKE SURE YOU:  Understand these instructions.  Will watch your condition.  Will get help right away if you are not doing well or get worse. Document Released: 02/19/2001 Document Revised: 08/27/2011 Document Reviewed: 07/07/2011 [facility] Patient Information 2014 [place].

## 2013-05-22 NOTE — ED Provider Notes (Signed)
Medical screening examination/treatment/procedure(s) were performed by resident physician or non-physician practitioner and as supervising physician I was immediately available for consultation/collaboration.   Pauline Good MD.   Billy Fischer, MD 05/22/13 [phone]

## 2013-05-22 NOTE — ED Notes (Signed)
C/o back and left leg pain Stated he did have an MRI on Monday; not aware of results States he was told he does have arthritis and a pinch nerve States he is in a lot of pain not sure if its because of the weather

## 2013-05-22 NOTE — ED Provider Notes (Signed)
CSN: [id number]     Arrival date & time 05/22/13  1415 History   First MD Initiated Contact with Patient 05/22/13 1542     Chief Complaint  Patient presents with  . Back Pain  . Leg Pain   (Consider location/radiation/quality/duration/timing/severity/associated sxs/prior Treatment) HPI Comments: 53 year old male with a history of chronic left low back pain with radiation to the left hip and posterior thigh presents with acute flareup of this pain. At this emergency department several times and more recently by Dr. Percell Miller who has been treating him with pain medications. 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 He was also told he had nerve pain like sciatica. Last prescription on the [place] substance reporting site was tramadol prescribed approximately a month and a half ago. States the pain, character intensity is similar to past instances.   Past Medical History  Diagnosis Date  . Hypertension   . Cocaine abuse   . Type II diabetes mellitus   . Arthritis     "hands" (07/28/2012)  . Angioedema of lips 07/28/2012    left upper (07/29/2012)   Past Surgical History  Procedure Laterality Date  . Inguinal hernia repair  04/01/2012    Procedure: HERNIA REPAIR INGUINAL ADULT;  Surgeon: Haywood Lasso, MD;  Location: [facility];  Service: General;  Laterality: Right;  . Hernia repair Right 04/01/2012  . Incision and drainage of wound      boil on back/notes 07/14/2008 (07/29/2012)  . I&d extremity Left 12/13/2012    Procedure: IRRIGATION AND DEBRIDEMENT LEFT THUMB;  Surgeon: Tennis Must, MD;  Location: [facility];  Service: Orthopedics;  Laterality: Left;   Family History  Problem Relation Age of Onset  . Diabetes Father    History  Substance Use Topics  . Smoking status: Current Every Day Smoker -- 0.25 packs/day for 30 years    Types: Cigarettes  . Smokeless tobacco: Never Used  . Alcohol Use: No    Review of Systems  Respiratory:  Negative.   Gastrointestinal: Negative.   Genitourinary: Negative.   Musculoskeletal: Positive for back pain, gait problem and myalgias.       As per HPI  Skin: Negative.   Neurological: Negative for dizziness, speech difficulty and headaches.  All other systems reviewed and are negative.    Allergies  Review of patient's allergies indicates no known allergies.  Home Medications   Current Outpatient Rx  Name  Route  Sig  Dispense  Refill  . esomeprazole (NEXIUM) 20 MG capsule   Oral   Take 20 mg by mouth daily at 12 noon.         . famotidine (PEPCID) 40 MG tablet   Oral   Take 1 tablet (40 mg total) by mouth daily.   14 tablet   1   . gabapentin (NEURONTIN) 300 MG capsule   Oral   Take 300 mg by mouth 3 (three) times daily.         Marland Kitchen gabapentin (NEURONTIN) 600 MG tablet   Oral   Take 600 mg by mouth at bedtime.         Marland Kitchen glipiZIDE (GLUCOTROL XL) 10 MG 24 hr tablet   Oral   Take 20 mg by mouth daily with breakfast.         . lisinopril (PRINIVIL,ZESTRIL) 10 MG tablet   Oral   Take 10 mg by mouth daily.         Marland Kitchen  metFORMIN (GLUCOPHAGE) 1000 MG tablet   Oral   Take 2,000 mg by mouth daily with breakfast.         . methocarbamol (ROBAXIN) 500 MG tablet   Oral   Take 1 tablet (500 mg total) by mouth 2 (two) times daily.   20 tablet   0   . pravastatin (PRAVACHOL) 20 MG tablet   Oral   Take 20 mg by mouth at bedtime.         . predniSONE (DELTASONE) 20 MG tablet      3 tabs po x 3 d, 2 tabs for 3 d, 1 tab for 3 d.   18 tablet   0   . sitaGLIPtin (JANUVIA) 100 MG tablet   Oral   Take 100 mg by mouth daily.         . traMADol (ULTRAM) 50 MG tablet   Oral   Take 1 tablet (50 mg total) by mouth every 6 (six) hours as needed.   15 tablet   0    BP 97/61  Pulse 85  Temp(Src) 98.5 F (36.9 C) (Oral)  Resp 17  SpO2 95% Physical Exam  Nursing note and vitals reviewed. Constitutional: He is oriented to person, place, and time. He  appears well-developed and well-nourished.  HENT:  Head: Normocephalic and atraumatic.  Eyes: EOM are normal. Left eye exhibits no discharge.  Neck: Normal range of motion. Neck supple.  Musculoskeletal:  Tenderness in L paralumbar musculature, L hip and buttock. No deformity or direct spinal tenderness. AndAmbulates with a crutch. No visible or palpable deformity.  L lower leg strength nl.  Neurological: He is alert and oriented to person, place, and time. No cranial nerve deficit.  Skin: Skin is warm and dry.  Psychiatric: He has a normal mood and affect.    ED Course  Procedures (including critical care time) Labs Review Labs Reviewed - No data to display Imaging Review No results found.   MDM   1. Chronic back pain   2. Chronic sciatica of left side     Must f/u with your PC Tramadol 50 mg #15     Discontined, pt st does not work and refuses. Norco 5 mg #10  Must see PCP for pain meds. Will not treat chronic pain at Urgent Care. Prednisone taper    Janne Napoleon, NP 05/22/13 [place], NP 05/22/13 1635

## 2013-06-08 ENCOUNTER — Emergency Department (HOSPITAL_COMMUNITY): Payer: No Typology Code available for payment source

## 2013-06-08 ENCOUNTER — Emergency Department (HOSPITAL_COMMUNITY)
Admission: EM | Admit: 2013-06-08 | Discharge: 2013-06-08 | Disposition: A | Discharge status: Home / Self Care | Payer: No Typology Code available for payment source | Attending: Emergency Medicine | Admitting: Emergency Medicine

## 2013-06-08 ENCOUNTER — Encounter (HOSPITAL_COMMUNITY): Payer: Self-pay | Admitting: Emergency Medicine

## 2013-06-08 DIAGNOSIS — I10 Essential (primary) hypertension: Secondary | ICD-10-CM | POA: Insufficient documentation

## 2013-06-08 DIAGNOSIS — M12849 Other specific arthropathies, not elsewhere classified, unspecified hand: Secondary | ICD-10-CM | POA: Insufficient documentation

## 2013-06-08 DIAGNOSIS — M5430 Sciatica, unspecified side: Secondary | ICD-10-CM | POA: Insufficient documentation

## 2013-06-08 DIAGNOSIS — E1149 Type 2 diabetes mellitus with other diabetic neurological complication: Secondary | ICD-10-CM | POA: Insufficient documentation

## 2013-06-08 DIAGNOSIS — IMO0002 Reserved for concepts with insufficient information to code with codable children: Secondary | ICD-10-CM | POA: Insufficient documentation

## 2013-06-08 DIAGNOSIS — F17200 Nicotine dependence, unspecified, uncomplicated: Secondary | ICD-10-CM | POA: Insufficient documentation

## 2013-06-08 DIAGNOSIS — E1142 Type 2 diabetes mellitus with diabetic polyneuropathy: Secondary | ICD-10-CM | POA: Insufficient documentation

## 2013-06-08 DIAGNOSIS — Z79899 Other long term (current) drug therapy: Secondary | ICD-10-CM | POA: Insufficient documentation

## 2013-06-08 DIAGNOSIS — M5417 Radiculopathy, lumbosacral region: Secondary | ICD-10-CM

## 2013-06-08 MED ORDER — HYDROMORPHONE HCL PF 2 MG/ML IJ SOLN
2.0000 mg | Freq: Once | INTRAMUSCULAR | Status: AC
  Administered 2013-06-08: 2 mg via INTRAMUSCULAR
  Filled 2013-06-08: qty 1

## 2013-06-08 MED ORDER — OXYCODONE-ACETAMINOPHEN 5-325 MG PO TABS: 1.0000 | ORAL_TABLET | Freq: Four times a day (QID) | ORAL | Status: DC | PRN

## 2013-06-08 MED ORDER — IBUPROFEN 600 MG PO TABS: 600.0000 mg | ORAL_TABLET | Freq: Four times a day (QID) | ORAL | Status: DC | PRN

## 2013-06-08 MED ORDER — METHOCARBAMOL 500 MG PO TABS: 1000.0000 mg | ORAL_TABLET | Freq: Four times a day (QID) | ORAL | Status: DC

## 2013-06-08 MED ORDER — ONDANSETRON 4 MG PO TBDP
4.0000 mg | ORAL_TABLET | Freq: Once | ORAL | Status: AC
  Administered 2013-06-08: 4 mg via ORAL
  Filled 2013-06-08: qty 1

## 2013-06-08 NOTE — Progress Notes (Signed)
[facility] CL spoke with patient about [employer]. Patient has [place] with [facility] but stated his PCP was [facility] [place]. CL scheduled patient a follow-up apt at PCP on 4/15 at 10 am.

## 2013-06-08 NOTE — ED Notes (Signed)
Per ems pt c/o sciatic pain; lower back pain; bilateral leg pain;

## 2013-06-08 NOTE — ED Provider Notes (Signed)
CSN: [id number]     Arrival date & time 06/08/13  1036 History   First MD Initiated Contact with Patient 06/08/13 1043     Chief Complaint  Patient presents with  . Sciatica   (Consider location/radiation/quality/duration/timing/severity/associated sxs/prior Treatment) HPI Comments: Patient with history of sciatica, history of diabetes -- presents with complaint of back and bilateral lower leg pain. Patient states that he had an injection in his back approximately 4 days ago. This improved his pain temporarily however pain has now returned. Typically he will only have left-sided radicular pain. Now this pain is more severe, more sharp, and radiates into both legs. Patient states the pain feels different than usual. He has baseline diabetic neuropathy in his fingers and toes which is unchanged. He states that he is not able to walk because of the severe pain. He denies fever or chills. The onset of this condition was gradual. The course is worsening. Aggravating factors: none. Alleviating factors: none.    The history is provided by the patient and medical records.    Past Medical History  Diagnosis Date  . Hypertension   . Cocaine abuse   . Type II diabetes mellitus   . Arthritis     "hands" (07/28/2012)  . Angioedema of lips 07/28/2012    left upper (07/29/2012)   Past Surgical History  Procedure Laterality Date  . Inguinal hernia repair  04/01/2012    Procedure: HERNIA REPAIR INGUINAL ADULT;  Surgeon: Haywood Lasso, MD;  Location: [facility];  Service: General;  Laterality: Right;  . Hernia repair Right 04/01/2012  . Incision and drainage of wound      boil on back/notes 07/14/2008 (07/29/2012)  . I&d extremity Left 12/13/2012    Procedure: IRRIGATION AND DEBRIDEMENT LEFT THUMB;  Surgeon: Tennis Must, MD;  Location: [facility];  Service: Orthopedics;  Laterality: Left;   Family History  Problem Relation Age of Onset  . Diabetes Father    History  Substance Use Topics  . Smoking status:  Current Every Day Smoker -- 0.25 packs/day for 30 years    Types: Cigarettes  . Smokeless tobacco: Never Used  . Alcohol Use: No    Review of Systems  Constitutional: Negative for fever and unexpected weight change.  Gastrointestinal: Negative for constipation.       Neg for fecal incontinence  Genitourinary: Negative for hematuria, flank pain and difficulty urinating.       Negative for urinary incontinence or retention  Musculoskeletal: Positive for back pain and myalgias. Negative for joint swelling.  Neurological: Positive for numbness. Negative for weakness.       Negative for saddle paresthesias    Allergies  Review of patient's allergies indicates no known allergies.  Home Medications   Current Outpatient Rx  Name  Route  Sig  Dispense  Refill  . esomeprazole (NEXIUM) 20 MG capsule   Oral   Take 20 mg by mouth daily at 12 noon.         . famotidine (PEPCID) 40 MG tablet   Oral   Take 1 tablet (40 mg total) by mouth daily.   14 tablet   1   . gabapentin (NEURONTIN) 300 MG capsule   Oral   Take 300 mg by mouth 3 (three) times daily.         Marland Kitchen gabapentin (NEURONTIN) 600 MG tablet   Oral   Take 600 mg by mouth at bedtime.         Marland Kitchen glipiZIDE (GLUCOTROL  XL) 10 MG 24 hr tablet   Oral   Take 20 mg by mouth daily with breakfast.         . HYDROcodone-acetaminophen (NORCO/VICODIN) 5-325 MG per tablet   Oral   Take 1 tablet by mouth every 4 (four) hours as needed.   10 tablet   0   . lisinopril (PRINIVIL,ZESTRIL) 10 MG tablet   Oral   Take 10 mg by mouth daily.         . metFORMIN (GLUCOPHAGE) 1000 MG tablet   Oral   Take 2,000 mg by mouth daily with breakfast.         . methocarbamol (ROBAXIN) 500 MG tablet   Oral   Take 1 tablet (500 mg total) by mouth 2 (two) times daily.   20 tablet   0   . pravastatin (PRAVACHOL) 20 MG tablet   Oral   Take 20 mg by mouth at bedtime.         . predniSONE (DELTASONE) 20 MG tablet      3 tabs po x 3  d, 2 tabs for 3 d, 1 tab for 3 d.   18 tablet   0   . sitaGLIPtin (JANUVIA) 100 MG tablet   Oral   Take 100 mg by mouth daily.          BP 132/69  Pulse 78  Temp(Src) 98 F (36.7 C) (Oral)  Resp 16  SpO2 98%  Physical Exam  Nursing note and vitals reviewed. Constitutional: He appears well-developed and well-nourished.  HENT:  Head: Normocephalic and atraumatic.  Eyes: Conjunctivae are normal.  Neck: Normal range of motion.  Abdominal: Soft. There is no tenderness. There is no CVA tenderness.  Musculoskeletal:       Cervical back: Normal.       Thoracic back: Normal.       Lumbar back: He exhibits decreased range of motion, tenderness and bony tenderness.  No step-off noted with palpation of spine. Minimal tenderness to percussion upper L spine. Pain is mostly bilateral paraspinous.   Neurological: He is alert. He has normal reflexes. No sensory deficit. He exhibits normal muscle tone.  5/5 strength in entire upper and lower extremities bilaterally. No sensation deficit. 2+ patellar reflexes. Some decreased sensation of soles and lateral feet.   Skin: Skin is warm and dry.  Psychiatric: He has a normal mood and affect.    ED Course  Procedures (including critical care time) Labs Review Labs Reviewed - No data to display Imaging Review No results found.   EKG Interpretation None      11:03 AM Patient seen and examined. Work-up initiated. Medications ordered. D/w Dr. Rogene Houston. Feel MRI is indicated to r/o abscess given new pain character and distribution, h/o DM, recent injection.   Vital signs reviewed and are as follows: Filed Vitals:   06/08/13 1040  BP: 132/69  Pulse: 78  Temp: 98 F (36.7 C)  Resp: 16   12:43 PM MRI reviewed. Patient informed of results. Exam is unchanged. We discussed the results and need for follow-up.   No red flag s/s of low back pain. Patient was counseled on back pain precautions and told to do activity as tolerated but do not  lift, push, or pull heavy objects more than 10 pounds for the next week.  Patient counseled to use ice or heat on back for no longer than 15 minutes every hour.   Patient prescribed muscle relaxer and counseled on proper use of  muscle relaxant medication.    Patient prescribed narcotic pain medicine and counseled on proper use of narcotic pain medications. Counseled not to combine this medication with others containing tylenol.   Urged patient not to drink alcohol, drive, or perform any other activities that requires focus while taking either of these medications.  Patient urged to follow-up with PCP if pain does not improve with treatment and rest or if pain becomes recurrent. Urged to return with worsening severe pain, loss of bowel or bladder control, trouble walking.   The patient verbalizes understanding and agrees with the plan.  MDM   Final diagnoses:  Lumbosacral radiculopathy   Patient with back pain. MRI ordered due to DM, recent instrumentation, pain not same as before. MRI neg.   No warning symptoms of back pain including: loss of bowel or bladder control, night sweats, waking from sleep with back pain, unexplained fevers or weight loss, h/o cancer, IVDU, recent trauma. No concern for cauda equina, epidural abscess, or other serious cause of back pain. Conservative measures such as rest, ice/heat and pain medicine indicated with PCP follow-up if no improvement with conservative management.    Carlisle Cater, PA-C 06/08/13 1246

## 2013-06-08 NOTE — Discharge Instructions (Signed)
Please read and follow all provided instructions.  Your diagnoses today include:  1. Lumbosacral radiculopathy     Tests performed today include:  Vital signs - see below for your results today  Medications prescribed:   Percocet (oxycodone/acetaminophen) - narcotic pain medication  DO NOT drive or perform any activities that require you to be awake and alert because this medicine can make you drowsy. BE VERY CAREFUL not to take multiple medicines containing Tylenol (also called acetaminophen). Doing so can lead to an overdose which can damage your liver and cause liver failure and possibly death.   Robaxin (methocarbamol) - muscle relaxer medication  DO NOT drive or perform any activities that require you to be awake and alert because this medicine can make you drowsy.    Ibuprofen (Motrin, Advil) - anti-inflammatory pain medication  Do not exceed 600mg  ibuprofen every 6 hours, take with food  You have been prescribed an anti-inflammatory medication or NSAID. Take with food. Take smallest effective dose for the shortest duration needed for your pain. Stop taking if you experience stomach pain or vomiting.   Take any prescribed medications only as directed.  Home care instructions:   Follow any educational materials contained in this packet  Please rest, use ice or heat on your back for the next several days  Do not lift, push, pull anything more than 10 pounds for the next week  Follow-up instructions: Please follow-up with your primary care provider in the next 1 week for further evaluation of your symptoms. If you do not have a primary care doctor -- see below for referral information.   Return instructions:  SEEK IMMEDIATE MEDICAL ATTENTION IF YOU HAVE:  New numbness, tingling, weakness, or problem with the use of your arms or legs  Severe back pain not relieved with medications  Loss control of your bowels or bladder  Increasing pain in any areas of the body  (such as chest or abdominal pain)  Shortness of breath, dizziness, or fainting.   Worsening nausea (feeling sick to your stomach), vomiting, fever, or sweats  Any other emergent concerns regarding your health   Additional Information:  Your vital signs today were: BP 132/69   Pulse 78   Temp(Src) 98 F (36.7 C) (Oral)   Resp 16   SpO2 98% If your blood pressure (BP) was elevated above 135/85 this visit, please have this repeated by your doctor within one month. --------------

## 2013-06-08 NOTE — ED Provider Notes (Signed)
Medical screening examination/treatment/procedure(s) were performed by non-physician practitioner and as supervising physician I was immediately available for consultation/collaboration.   EKG Interpretation None        Mervin Kung, MD 06/08/13 1247

## 2013-06-21 ENCOUNTER — Emergency Department (HOSPITAL_COMMUNITY)
Admission: EM | Admit: 2013-06-21 | Discharge: 2013-06-21 | Disposition: A | Discharge status: Home / Self Care | Payer: No Typology Code available for payment source | Attending: Emergency Medicine | Admitting: Emergency Medicine

## 2013-06-21 ENCOUNTER — Encounter (HOSPITAL_COMMUNITY): Payer: Self-pay | Admitting: Emergency Medicine

## 2013-06-21 DIAGNOSIS — M19049 Primary osteoarthritis, unspecified hand: Secondary | ICD-10-CM | POA: Insufficient documentation

## 2013-06-21 DIAGNOSIS — E119 Type 2 diabetes mellitus without complications: Secondary | ICD-10-CM | POA: Insufficient documentation

## 2013-06-21 DIAGNOSIS — G8929 Other chronic pain: Secondary | ICD-10-CM

## 2013-06-21 DIAGNOSIS — M545 Low back pain, unspecified: Secondary | ICD-10-CM | POA: Insufficient documentation

## 2013-06-21 DIAGNOSIS — F17200 Nicotine dependence, unspecified, uncomplicated: Secondary | ICD-10-CM | POA: Insufficient documentation

## 2013-06-21 DIAGNOSIS — M549 Dorsalgia, unspecified: Secondary | ICD-10-CM

## 2013-06-21 DIAGNOSIS — I10 Essential (primary) hypertension: Secondary | ICD-10-CM | POA: Insufficient documentation

## 2013-06-21 DIAGNOSIS — Z79899 Other long term (current) drug therapy: Secondary | ICD-10-CM | POA: Insufficient documentation

## 2013-06-21 DIAGNOSIS — F1410 Cocaine abuse, uncomplicated: Secondary | ICD-10-CM | POA: Insufficient documentation

## 2013-06-21 MED ORDER — OXYCODONE-ACETAMINOPHEN 5-325 MG PO TABS: 1.0000 | ORAL_TABLET | ORAL | Status: DC | PRN

## 2013-06-21 NOTE — ED Notes (Signed)
Pt reports increased l/leg pain over last 3 days

## 2013-06-21 NOTE — ED Provider Notes (Signed)
CSN: [id number]     Arrival date & time 06/21/13  1138 History  This chart was scribed for non-physician practitioner, Quincy Carnes, PA-C, working with Janice Norrie, MD, by Sydell Axon, ED Scribe. This patient was seen in room WTR8/WTR8 and the patient's care was started at 12:05 PM.  The history is provided by the patient. No language interpreter was used.   HPI Comments: David Wyatt is a 53 y.o. male who was brought to the Emergency Department by EMS with a chief complaint of chronic lower L back pain and L leg pain. Patient states that the pain has been progressively worsening over the past three days. Patient reports recently having had an MRI and was told that surgery was needed. He characterizes the pain as sharp that radiates down to his leg and reports it is made worse with pressure and movement. Patient reports that he has had spinal injections with temporary relief-- lasted only two days. He denies any recent falls or injury to the affected area. Patient confirms he has been taking percocet while "dulls" his pain but does not resolve it. He denies having seen pain management in the past. Patient confirms not currently having medical insurance.  Denies numbness or paresthesias of LE.  No loss of bowel or bladder control.  He has been using a crutch to help him ambulate.  Past Medical History  Diagnosis Date  . Hypertension   . Cocaine abuse   . Type II diabetes mellitus   . Arthritis     "hands" (07/28/2012)  . Angioedema of lips 07/28/2012    left upper (07/29/2012)   Past Surgical History  Procedure Laterality Date  . Inguinal hernia repair  04/01/2012    Procedure: HERNIA REPAIR INGUINAL ADULT;  Surgeon: Haywood Lasso, MD;  Location: [facility];  Service: General;  Laterality: Right;  . Hernia repair Right 04/01/2012  . Incision and drainage of wound      boil on back/notes 07/14/2008 (07/29/2012)  . I&d extremity Left 12/13/2012    Procedure: IRRIGATION AND DEBRIDEMENT LEFT THUMB;   Surgeon: Tennis Must, MD;  Location: [facility];  Service: Orthopedics;  Laterality: Left;   Family History  Problem Relation Age of Onset  . Diabetes Father    History  Substance Use Topics  . Smoking status: Current Every Day Smoker -- 0.25 packs/day for 30 years    Types: Cigarettes  . Smokeless tobacco: Never Used  . Alcohol Use: No    Review of Systems  Constitutional: Negative for chills.  Respiratory: Negative for shortness of breath.   Gastrointestinal: Negative for nausea and vomiting.  Musculoskeletal: Positive for arthralgias and back pain. Negative for joint swelling, neck pain and neck stiffness.  Neurological: Negative for weakness.  All other systems reviewed and are negative.  Allergies  Review of patient's allergies indicates no known allergies.  Home Medications   Current Outpatient Rx  Name  Route  Sig  Dispense  Refill  . gabapentin (NEURONTIN) 600 MG tablet   Oral   Take 600 mg by mouth at bedtime.         Marland Kitchen glipiZIDE (GLUCOTROL XL) 10 MG 24 hr tablet   Oral   Take 20 mg by mouth daily with breakfast.         . ibuprofen (ADVIL,MOTRIN) 600 MG tablet   Oral   Take 1 tablet (600 mg total) by mouth every 6 (six) hours as needed.   20 tablet   0   .  lisinopril (PRINIVIL,ZESTRIL) 10 MG tablet   Oral   Take 10 mg by mouth daily.         . metFORMIN (GLUCOPHAGE) 1000 MG tablet   Oral   Take 2,000 mg by mouth daily with breakfast.         . methocarbamol (ROBAXIN) 500 MG tablet   Oral   Take 2 tablets (1,000 mg total) by mouth 4 (four) times daily.   20 tablet   0   . oxyCODONE-acetaminophen (PERCOCET/ROXICET) 5-325 MG per tablet   Oral   Take 1-2 tablets by mouth every 6 (six) hours as needed for severe pain.   10 tablet   0    Triage Vitals: BP 99/61  Pulse 91  Temp(Src) 98.4 F (36.9 C) (Oral)  Resp 18  SpO2 97%  Physical Exam  Nursing note and vitals reviewed. Constitutional: He is oriented to person, place, and time. He  appears well-developed and well-nourished.  HENT:  Head: Normocephalic and atraumatic.  Mouth/Throat: Oropharynx is clear and moist.  Eyes: Conjunctivae and EOM are normal. Pupils are equal, round, and reactive to light.  Neck: Normal range of motion.  Cardiovascular: Normal rate, regular rhythm and normal heart sounds.   Pulmonary/Chest: Effort normal and breath sounds normal. No respiratory distress. He has no wheezes.  Abdominal: Soft. Bowel sounds are normal.  Musculoskeletal: He exhibits tenderness.  Tenderness over L SI joint. No midline step-off or deformity; no visible signs of trauma.  Positive L straight leg raise. Distal sensation intact. Ambulating assisted with crutch on left side  Neurological: He is alert and oriented to person, place, and time.  Skin: Skin is warm and dry.  Psychiatric: He has a normal mood and affect.    ED Course  Procedures (including critical care time)  DIAGNOSTIC STUDIES: Oxygen Saturation is 97% on room air, adequate by my interpretation.    COORDINATION OF CARE: 12:08 PM-Prescribed oxycodone for patient and recommended seeing pain management to help with the ongoing pain. Treatment plan discussed with patient and patient agrees.  MDM   Final diagnoses:  Chronic back pain   Chronic back pain.  MRI from March 2015 was reviewed, pt does have multilevel disc herniation.  No signs/sx concerning for cauda equina today.  Pt will be given short supply of percocet, recommended FU with pain management to establish care.  Discussed plan with pt, they agreed.  Return precautions given for new or worsening symptoms.  I personally performed the services described in this documentation, which was scribed in my presence. The recorded information has been reviewed and is accurate.  Larene Pickett, PA-C 06/21/13 [phone]

## 2013-06-21 NOTE — ED Notes (Signed)
Per EMS- patient c/o let sciatic pain that radiates to the left leg x 3 days.

## 2013-06-21 NOTE — Progress Notes (Signed)
[facility] CL spoke with patient about David Wyatt. Spoke with patient on last ED visit (3/31) and at that time scheduled a follow-up apt with PCP [facility] on Wed. 4/15 at 10am. Patient stated that he still had apt information.

## 2013-06-21 NOTE — Discharge Instructions (Signed)
Take the prescribed medication as directed. Follow-up with pain management to establish care-- call and schedule appt. Return to the ED for new or worsening symptoms.

## 2013-06-21 NOTE — ED Provider Notes (Signed)
Medical screening examination/treatment/procedure(s) were performed by non-physician practitioner and as supervising physician I was immediately available for consultation/collaboration.   EKG Interpretation None      Rolland Porter, MD, Abram Sander   Janice Norrie, MD 06/21/13 1500

## 2013-06-21 NOTE — ED Notes (Signed)
Bed: WTR8 Expected date:  Expected time:  Means of arrival:  Comments: ems- back pain sciatic pain

## 2013-06-30 ENCOUNTER — Encounter (HOSPITAL_COMMUNITY): Payer: Self-pay | Admitting: Emergency Medicine

## 2013-06-30 ENCOUNTER — Emergency Department (HOSPITAL_COMMUNITY)
Admission: EM | Admit: 2013-06-30 | Discharge: 2013-06-30 | Disposition: A | Discharge status: Home / Self Care | Payer: No Typology Code available for payment source | Attending: Emergency Medicine | Admitting: Emergency Medicine

## 2013-06-30 DIAGNOSIS — M549 Dorsalgia, unspecified: Secondary | ICD-10-CM | POA: Insufficient documentation

## 2013-06-30 DIAGNOSIS — T783XXA Angioneurotic edema, initial encounter: Secondary | ICD-10-CM

## 2013-06-30 DIAGNOSIS — M19049 Primary osteoarthritis, unspecified hand: Secondary | ICD-10-CM | POA: Insufficient documentation

## 2013-06-30 DIAGNOSIS — Z79899 Other long term (current) drug therapy: Secondary | ICD-10-CM | POA: Insufficient documentation

## 2013-06-30 DIAGNOSIS — G8929 Other chronic pain: Secondary | ICD-10-CM

## 2013-06-30 DIAGNOSIS — I10 Essential (primary) hypertension: Secondary | ICD-10-CM | POA: Insufficient documentation

## 2013-06-30 DIAGNOSIS — Z8669 Personal history of other diseases of the nervous system and sense organs: Secondary | ICD-10-CM | POA: Insufficient documentation

## 2013-06-30 DIAGNOSIS — F17200 Nicotine dependence, unspecified, uncomplicated: Secondary | ICD-10-CM | POA: Insufficient documentation

## 2013-06-30 DIAGNOSIS — E119 Type 2 diabetes mellitus without complications: Secondary | ICD-10-CM | POA: Insufficient documentation

## 2013-06-30 DIAGNOSIS — T4995XA Adverse effect of unspecified topical agent, initial encounter: Secondary | ICD-10-CM | POA: Insufficient documentation

## 2013-06-30 HISTORY — DX: Polyneuropathy, unspecified: G62.9

## 2013-06-30 LAB — CBG MONITORING, ED: Glucose-Capillary: 219 mg/dL — ABNORMAL HIGH (ref 70–99)

## 2013-06-30 MED ORDER — FAMOTIDINE IN NACL 20-0.9 MG/50ML-% IV SOLN
20.0000 mg | Freq: Once | INTRAVENOUS | Status: AC
  Administered 2013-06-30: 20 mg via INTRAVENOUS
  Filled 2013-06-30: qty 50

## 2013-06-30 MED ORDER — METHOCARBAMOL 500 MG PO TABS
1000.0000 mg | ORAL_TABLET | Freq: Once | ORAL | Status: AC
  Administered 2013-06-30: 1000 mg via ORAL
  Filled 2013-06-30: qty 2

## 2013-06-30 MED ORDER — METHYLPREDNISOLONE SODIUM SUCC 125 MG IJ SOLR
125.0000 mg | Freq: Once | INTRAMUSCULAR | Status: AC
  Administered 2013-06-30: 125 mg via INTRAVENOUS
  Filled 2013-06-30: qty 2

## 2013-06-30 MED ORDER — KETOROLAC TROMETHAMINE 60 MG/2ML IM SOLN
60.0000 mg | Freq: Once | INTRAMUSCULAR | Status: AC
  Administered 2013-06-30: 60 mg via INTRAMUSCULAR
  Filled 2013-06-30: qty 2

## 2013-06-30 MED ORDER — OXYCODONE-ACETAMINOPHEN 5-325 MG PO TABS
1.0000 | ORAL_TABLET | Freq: Once | ORAL | Status: AC
  Administered 2013-06-30: 1 via ORAL
  Filled 2013-06-30: qty 1

## 2013-06-30 MED ORDER — OXYCODONE-ACETAMINOPHEN 5-325 MG PO TABS: 1.0000 | ORAL_TABLET | ORAL | Status: DC | PRN

## 2013-06-30 MED ORDER — METHOCARBAMOL 500 MG PO TABS: 1000.0000 mg | ORAL_TABLET | Freq: Four times a day (QID) | ORAL | Status: DC

## 2013-06-30 MED ORDER — PREDNISONE 20 MG PO TABS
60.0000 mg | ORAL_TABLET | Freq: Once | ORAL | Status: DC
  Filled 2013-06-30: qty 3

## 2013-06-30 MED ORDER — PREDNISONE 20 MG PO TABS: 40.0000 mg | ORAL_TABLET | Freq: Every day | ORAL | Status: DC

## 2013-06-30 MED ORDER — DIPHENHYDRAMINE HCL 25 MG PO TABS: 25.0000 mg | ORAL_TABLET | Freq: Four times a day (QID) | ORAL | Status: DC

## 2013-06-30 MED ORDER — MELOXICAM 7.5 MG PO TABS: 15.0000 mg | ORAL_TABLET | Freq: Every day | ORAL | Status: DC

## 2013-06-30 NOTE — Progress Notes (Signed)
[facility] CL spoke with patient about [employer]. Patient PCP is [facility] but has been receiving services through [facility]. Patient stated that he had an apt today at 3:00, asked CL to re-schedule. CL rescheduled patient apt for Friday 4/24 at 2:00pm.

## 2013-06-30 NOTE — ED Provider Notes (Signed)
CSN: [id number]     Arrival date & time 06/30/13  1038 History   First MD Initiated Contact with Patient 06/30/13 1047     Chief Complaint  Patient presents with  . sciatica pain   . Oral Swelling     (Consider location/radiation/quality/duration/timing/severity/associated sxs/prior Treatment) HPI Pt is a 53yo male with hx of HTN, cocaine abuse, type II diabetes, arthritis in hands, angioedema of lips (left upper 07/29/12), and neuropathy brought to ED c/o gradually worsening back pain that started last night as well as swelling of upper lip that started this morning. Pt states back pain is constant, cramping, burning over entire back, 10/10, worse with ambulation. States pain made it difficult to sleep last night, hx of left sciatic pain but today pain is over entire back. He was able to walk to breakfast this morning where he had a muffin, cereal, pop-tarts, and apple juice, then went home to nap. When he woke up he noticed swelling of his upper lip. Denies pain or itching. Denies throat or tongue swelling. Denies difficulty breathing. Benadryl given en route to ED via EMS.  Pt denies hx of same, however, medical records indicate similar incident 07/29/12. Pt is on lisinopril for BP.  Denies known allergies.  Past Medical History  Diagnosis Date  . Hypertension   . Cocaine abuse   . Type II diabetes mellitus   . Arthritis     "hands" (07/28/2012)  . Angioedema of lips 07/28/2012    left upper (07/29/2012)  . Neuropathy    Past Surgical History  Procedure Laterality Date  . Inguinal hernia repair  04/01/2012    Procedure: HERNIA REPAIR INGUINAL ADULT;  Surgeon: Haywood Lasso, MD;  Location: [place] [facility];  Service: General;  Laterality: Right;  . Hernia repair Right 04/01/2012  . Incision and drainage of wound      boil on back/notes 07/14/2008 (07/29/2012)  . I&d extremity Left 12/13/2012    Procedure: IRRIGATION AND DEBRIDEMENT LEFT THUMB;  Surgeon: Tennis Must, MD;  Location: [facility];   Service: Orthopedics;  Laterality: Left;   Family History  Problem Relation Age of Onset  . Diabetes Father    History  Substance Use Topics  . Smoking status: Current Every Day Smoker -- 0.25 packs/day for 30 years    Types: Cigarettes  . Smokeless tobacco: Never Used  . Alcohol Use: No    Review of Systems  Constitutional: Negative for fever, chills and fatigue.  HENT: Negative for sore throat and trouble swallowing.   Respiratory: Negative for cough, shortness of breath, wheezing and stridor.   Cardiovascular: Negative for chest pain.  Gastrointestinal: Negative for nausea, vomiting and abdominal pain.  Musculoskeletal: Positive for back pain.  Neurological: Negative for weakness.  All other systems reviewed and are negative.     Allergies  Review of patient's allergies indicates no known allergies.  Home Medications   Prior to Admission medications   Medication Sig Start Date End Date Taking? Authorizing Provider  gabapentin (NEURONTIN) 600 MG tablet Take 600 mg by mouth at bedtime.    Historical Provider, MD  glipiZIDE (GLUCOTROL XL) 10 MG 24 hr tablet Take 20 mg by mouth daily with breakfast.    Historical Provider, MD  ibuprofen (ADVIL,MOTRIN) 600 MG tablet Take 1 tablet (600 mg total) by mouth every 6 (six) hours as needed. 06/08/13   Carlisle Cater, PA-C  lisinopril (PRINIVIL,ZESTRIL) 10 MG tablet Take 10 mg by mouth daily.    Historical Provider, MD  metFORMIN (GLUCOPHAGE) 1000 MG tablet Take 2,000 mg by mouth daily with breakfast.    Historical Provider, MD  methocarbamol (ROBAXIN) 500 MG tablet Take 2 tablets (1,000 mg total) by mouth 4 (four) times daily. 06/08/13   Carlisle Cater, PA-C  oxyCODONE-acetaminophen (PERCOCET/ROXICET) 5-325 MG per tablet Take 1-2 tablets by mouth every 6 (six) hours as needed for severe pain. 06/08/13   Carlisle Cater, PA-C  oxyCODONE-acetaminophen (PERCOCET/ROXICET) 5-325 MG per tablet Take 1 tablet by mouth every 4 (four) hours as needed.  06/21/13   Larene Pickett, PA-C   BP 101/64  Pulse 80  Temp(Src) 98.3 F (36.8 C) (Oral)  Resp 18  SpO2 96% Physical Exam  Nursing note and vitals reviewed. Constitutional: He appears well-developed and well-nourished.  HENT:  Head: Normocephalic and atraumatic.  Right Ear: Hearing, tympanic membrane, external ear and ear canal normal.  Left Ear: Hearing, tympanic membrane, external ear and ear canal normal.  Nose: Nose normal.  Mouth/Throat: Uvula is midline, oropharynx is clear and moist and mucous membranes are normal.  Moderate edema of upper lip  Eyes: Conjunctivae are normal. No scleral icterus.  Neck: Normal range of motion.  Cardiovascular: Normal rate, regular rhythm and normal heart sounds.   Pulmonary/Chest: Effort normal and breath sounds normal. No respiratory distress. He has no wheezes. He has no rales. He exhibits no tenderness.  Abdominal: Soft. Bowel sounds are normal. He exhibits no distension and no mass. There is no tenderness. There is no rebound and no guarding.  Musculoskeletal: Normal range of motion. He exhibits tenderness.  Diffuse tenderness over back musculature. Worse in lumbar region.  No step offs or crepitus.  Antalgic gait, ambulatory with assistance of one crutch.  Neurological: He is alert.  Skin: Skin is warm and dry.  Skin in tact. No ecchymosis, erythema, or warmth. No red streaking, induration, or evidence of underlying infection.    ED Course  Procedures (including critical care time) Labs Review Labs Reviewed  CBG MONITORING, ED - Abnormal; Notable for the following:    Glucose-Capillary 219 (*)    All other components within normal limits    Imaging Review No results found.   EKG Interpretation None      MDM   Final diagnoses:  Chronic back pain  Angioedema    pt presenting to ED via EMS with two separate complaints including back pain and angioedema of upper lip.  Back pain sounds musculoskeletal in nature. No hx of  trauma. No red flag symptoms. Do not believe imaging needed at this time.  Pt also has angioedema of upper lip w/o additional symptoms concerning for anaphylaxis.O2-97% on RA.  Pt is on lisinopril and has hx of same. Medical records also indicate pt had MRI of lumbar spine on 06/08/13 that indicate multiple area of disc herniation.  No red flag symptoms today to suggest cauda equina, including no loss of bowel or bladder, unexplained fevers, night sweats, hx of cancer, IVDU, or recent trauma. Do not believe repeat imaging needed at this time.   Discussed pt with Dr. Eulis Foster, will give PO pain medication for back and IV meds- solumedrol and pepcid for angioedema. Pt was given 50mg  benadryl PO via EMS PTA.  12:35 PM pt sleeping comfortably in exam bed, easily awakened. States his back pain is still 10/10.  Angioedema has improved slightly. Still no evidence of respiratory distress or additional oral swelling.    Pt will be discharged home. Conservative tx discussed with pt for back. Rx: robaxin,  mobic and percocet. Pt also being discharged home with prednisone and benadryl for angioedema. Advised to D/C use of lisinopril and f/u with PCP within 2-3 days for recheck of symptoms and start on new BP medication. Advised to f/u with PCP and neurosurgery for chronic back pain. Return precautions provided. Pt verbalized understanding and agreement with tx plan.        Noland Fordyce, PA-C 06/30/13 1322

## 2013-06-30 NOTE — Discharge Instructions (Signed)
Stop taking your blood pressure medication- Lisinopril as it is likely the cause of upper lip swelling.  Call to schedule an appointment with your family doctor to discuss starting a different blood pressure medication.  Follow up with primary care and neurosurgery for chronic back pain. Only take robaxin and percocet as needed for severe pain. Do not drive while taking as both medications can cause drowsiness. Return to ER for NEW or worsening symptoms, including unable to walk, develop a fever, or change in bowel or bladder habits.  See below for further instructions.    Angioedema Angioedema is sudden puffiness (swelling), often of the skin. It can happen:  On your face or privates (genitals).  In your belly (abdomen) or other body parts. It usually happens quickly and gets better in 1 or 2 days. It often starts at night and is found when you wake up. You may get red, itchy patches of skin (hives). Attacks can be dangerous if your breathing passages get puffy. The condition may happen only once, or it can come back at random times. It may happen for several years before it goes away for good. HOME CARE  Only take medicines as told by your doctor.  Always carry your emergency allergy medicines with you.  Wear a medical bracelet as told by your doctor.  Avoid things that you know will cause attacks (triggers). GET HELP IF:  You have another attack.  Your attacks happen more often or get worse.  The condition was passed to you by your parents and you want to have children. GET HELP RIGHT AWAY IF:   Your mouth, tongue, or lips are very puffy.  You have trouble breathing.  You have trouble swallowing.  You pass out (faint). MAKE SURE YOU:   Understand these instructions.  Will watch your condition.  Will get help right away if you are not doing well or get worse. Document Released: 02/13/2009 Document Revised: 12/16/2012 Document Reviewed: 10/19/2012 [facility] Patient  Information 2014 [place], [place].  Back Exercises Back exercises help treat and prevent back injuries. The goal is to increase your strength in your belly (abdominal) and back muscles. These exercises can also help with flexibility. Start these exercises when told by your doctor. HOME CARE Back exercises include: Pelvic Tilt.  Lie on your back with your knees bent. Tilt your pelvis until the lower part of your back is against the floor. Hold this position 5 to 10 sec. Repeat this exercise 5 to 10 times. Knee to Chest.  Pull 1 knee up against your chest and hold for 20 to 30 seconds. Repeat this with the other knee. This may be done with the other leg straight or bent, whichever feels better. Then, pull both knees up against your chest. Sit-Ups or Curl-Ups.  Bend your knees 90 degrees. Start with tilting your pelvis, and do a partial, slow sit-up. Only lift your upper half 30 to 45 degrees off the floor. Take at least 2 to 3 seonds for each sit-up. Do not do sit-ups with your knees out straight. If partial sit-ups are difficult, simply do the above but with only tightening your belly (abdominal) muscles and holding it as told. Hip-Lift.  Lie on your back with your knees flexed 90 degrees. Push down with your feet and shoulders as you raise your hips 2 inches off the floor. Hold for 10 seconds, repeat 5 to 10 times. Back Arches.  Lie on your stomach. Prop yourself up on bent elbows. Slowly press  on your hands, causing an arch in your low back. Repeat 3 to 5 times. Shoulder-Lifts.  Lie face down with arms beside your body. Keep hips and belly pressed to floor as you slowly lift your head and shoulders off the floor. Do not overdo your exercises. Be careful in the beginning. Exercises may cause you some mild back discomfort. If the pain lasts for more than 15 minutes, stop the exercises until you see your doctor. Improvement with exercise for back problems is slow.  Document Released: 03/30/2010  Document Revised: 05/20/2011 Document Reviewed: 12/27/2010 [facility] Patient Information 2014 [place], [place].

## 2013-06-30 NOTE — ED Notes (Signed)
Bed: WA17 Expected date:  Expected time:  Means of arrival:  Comments: Sciatica 53 y/o

## 2013-06-30 NOTE — ED Notes (Signed)
Patient walked in the hallway without assistance, but hobbled and stated that he still had pain in the right buttock when he walked.

## 2013-06-30 NOTE — ED Notes (Signed)
Per EMS- Patient has a history of sciatica nerve pain, but reports pain from neck down to both legs. Patient states he went to eat breakfast, fell asleep and when he woke up he had upper lip swelling. EMS gave Benadryl 50 mg po. Patient denies any trouble breathing, swallowing, or tongue swelling.

## 2013-06-30 NOTE — ED Provider Notes (Signed)
  Face-to-face evaluation   History: Here for evaluation of  upper lip swelling, and low back pain, which is ongoing. He has been previously evaluated for his back pain with MRI, on 06/08/2013  Physical exam: Alert, calm, cooperative. Mild upper lip swelling. No tongue or oral swelling. Mild, low back pain to light palpation.  Medical screening examination/treatment/procedure(s) were conducted as a shared visit with non-physician practitioner(s) and myself.  I personally evaluated the patient during the encounter  Richarda Blade, MD 06/30/13 1740

## 2013-07-12 ENCOUNTER — Encounter: Payer: Self-pay | Admitting: Internal Medicine

## 2013-07-12 ENCOUNTER — Ambulatory Visit: Payer: No Typology Code available for payment source | Attending: Internal Medicine | Admitting: Internal Medicine

## 2013-07-12 VITALS — BP 135/87 | HR 83 | Temp 98.3°F | Resp 16 | Ht 68.0 in | Wt 180.0 lb

## 2013-07-12 DIAGNOSIS — M5416 Radiculopathy, lumbar region: Secondary | ICD-10-CM

## 2013-07-12 DIAGNOSIS — G629 Polyneuropathy, unspecified: Secondary | ICD-10-CM

## 2013-07-12 DIAGNOSIS — M549 Dorsalgia, unspecified: Secondary | ICD-10-CM

## 2013-07-12 DIAGNOSIS — E119 Type 2 diabetes mellitus without complications: Secondary | ICD-10-CM

## 2013-07-12 DIAGNOSIS — N182 Chronic kidney disease, stage 2 (mild): Secondary | ICD-10-CM

## 2013-07-12 DIAGNOSIS — G8929 Other chronic pain: Secondary | ICD-10-CM

## 2013-07-12 LAB — GLUCOSE, POCT (MANUAL RESULT ENTRY): POC GLUCOSE: 197 mg/dL — AB (ref 70–99)

## 2013-07-12 LAB — POCT GLYCOSYLATED HEMOGLOBIN (HGB A1C): Hemoglobin A1C: 7

## 2013-07-12 MED ORDER — GABAPENTIN 100 MG PO CAPS: 100.0000 mg | ORAL_CAPSULE | Freq: Three times a day (TID) | ORAL | Status: DC

## 2013-07-12 MED ORDER — METFORMIN HCL 1000 MG PO TABS: 1000.0000 mg | ORAL_TABLET | Freq: Two times a day (BID) | ORAL | Status: DC

## 2013-07-12 MED ORDER — GLIPIZIDE ER 10 MG PO TB24: 20.0000 mg | ORAL_TABLET | Freq: Every day | ORAL | Status: DC

## 2013-07-12 MED ORDER — PREDNISONE 20 MG PO TABS: 40.0000 mg | ORAL_TABLET | Freq: Every day | ORAL | Status: DC

## 2013-07-12 MED ORDER — NAPROXEN 250 MG PO TABS: 250.0000 mg | ORAL_TABLET | Freq: Two times a day (BID) | ORAL | Status: DC

## 2013-07-12 NOTE — Progress Notes (Unsigned)
Patient ID: David Wyatt, male   DOB: 02-17-1961, 53 y.o.   MRN: [id number]   HPI: David Wyatt is a 53 y.o. male presenting on 07/12/2013 with lower back pain for 1 yr which at times radiates down his left leg. He has been seen by Percell Miller and Noemi Chapel and received a steroid injection into his back. The effect lasted about 2 days and his pain returned. He is not taking any other medications for it now. He also c/o having diabetic neuropathy and has no feeling in his fingers and feet.     Past Medical History  Diagnosis Date  . Hypertension   . Cocaine abuse   . Type II diabetes mellitus   . Arthritis     "hands" (07/28/2012)  . Angioedema of lips 07/28/2012    left upper (07/29/2012)  . Neuropathy     Past Surgical History  Procedure Laterality Date  . Inguinal hernia repair  04/01/2012    Procedure: HERNIA REPAIR INGUINAL ADULT;  Surgeon: Haywood Lasso, MD;  Location: [place];  Service: General;  Laterality: Right;  . Hernia repair Right 04/01/2012  . Incision and drainage of wound      boil on back/notes 07/14/2008 (07/29/2012)  . I&d extremity Left 12/13/2012    Procedure: IRRIGATION AND DEBRIDEMENT LEFT THUMB;  Surgeon: Tennis Must, MD;  Location: [place] [facility];  Service: Orthopedics;  Laterality: Left;    Current Outpatient Prescriptions  Medication Sig Dispense Refill  . gabapentin (NEURONTIN) 600 MG tablet Take 600 mg by mouth at bedtime.      Marland Kitchen glipiZIDE (GLUCOTROL XL) 10 MG 24 hr tablet Take 2 tablets (20 mg total) by mouth daily with breakfast.  60 tablet  0  . metFORMIN (GLUCOPHAGE) 1000 MG tablet Take 1 tablet (1,000 mg total) by mouth 2 (two) times daily with a meal.  60 tablet  0   No current facility-administered medications for this visit.    No Known Allergies  Family History  Problem Relation Age of Onset  . Diabetes Father     History   Social History  . Marital Status: Legally Separated    Spouse Name: N/A    Number of Children: N/A  . Years of Education: N/A    Occupational History  . Not on file.   Social History Main Topics  . Smoking status: Current Every Day Smoker -- 0.25 packs/day for 30 years    Types: Cigarettes  . Smokeless tobacco: Never Used  . Alcohol Use: No  . Drug Use: Yes    Special: "Crack" cocaine, Cocaine     Comment: no cocaine x 2weeks  . Sexual Activity: No   Other Topics Concern  . Not on file   Social History Narrative  . No narrative on file    Review of Systems  Per HPI  Objective:  BP 135/87  Pulse 83  Temp(Src) 98.3 F (36.8 C) (Oral)  Resp 16  Ht 5\' 8"  (1.727 m)  Wt 180 lb (81.647 kg)  BMI 27.38 kg/m2  SpO2 97% Filed Weights   07/12/13 1130  Weight: 180 lb (81.647 kg)     Physical Exam  Constitutional: Appears well-developed and well-nourished. No distress. HENT: Normocephalic. External right and left ear normal. Oropharynx is clear and moist.  Eyes: Conjunctivae and EOM are normal. PERRLA, no scleral icterus.  Neck: Normal ROM. Neck supple. No JVD. No tracheal deviation. No thyromegaly.  CVS: RRR, S1/S2 +, no murmurs, no gallops, no carotid bruit.  Pulmonary:  Effort and breath sounds normal, no stridor, rhonchi, wheezes, rales.  Abdominal: Soft. BS +,  no distension, tenderness, rebound or guarding.  Musculoskeletal: mild tenderness across lower lumbar spine.  Neuro: Alert. Normal reflexes, muscle tone coordination. No cranial nerve deficit. Skin: Skin is warm and dry. No rash noted. Not diaphoretic. No erythema. No pallor.  Psychiatric: Normal mood and affect. Behavior, judgment, thought content normal.   Lab Results  Component Value Date   WBC 9.0 05/05/2013   HGB 13.8 05/05/2013   HCT 41.0 05/05/2013   MCV 94.5 05/05/2013   PLT 373 05/05/2013   Lab Results  Component Value Date   CREATININE 1.13 05/05/2013   BUN 21 05/05/2013   NA 135* 05/05/2013   K 4.7 05/05/2013   CL 99 05/05/2013   CO2 20 05/05/2013    Lab Results  Component Value Date   HGBA1C 7.0 07/12/2013   Lipid  Panel  No results found for this basename: chol,  trig,  hdl,  cholhdl,  vldl,  ldlcalc        Patient Active Problem List   Diagnosis Date Noted  . Viral gastroenteritis 05/05/2013  . Hypoglycemia 05/05/2013  . Felon of finger of left hand with lymphangitis 12/13/2012  . Hyperglycemia 07/29/2012  . Uncontrolled diabetes mellitus 07/29/2012  . Angioedema of lips 07/29/2012  . Crack cocaine use 07/29/2012  . Arthritis 07/29/2012  . Weakness generalized 07/29/2012     Preventative Medicine:  Health Maintenance  Topic Date Due  . Colonoscopy  10/12/2009  . Influenza Vaccine  10/09/2013  . Tetanus/tdap  04/10/2023    Adult vaccines due  Topic Date Due  . Tetanus/tdap  04/10/2023    LAB WORK:  Metabolic panel: completed recently CBC: completed recently  Follow have been ordered Vitamin D : Lipid Panel: TSH: PSA:    Assessment and plan: Chronic back pain and Radiculopathy of lumbar region  - Plan: Ambulatory referral to Sports Medicine-  Ambulatory referral to Physical Therapy - 5 days of Prednisone at 40 mg daily - Neurontin - start 100 mg BID with breakfast and lunch (already taking 600 mg at bedtime)-- titrate up to 300 mg BID over the next 3 wks-  -start Naproxen   DM type 2 (diabetes mellitus, type 2)  - Hba1c is 7 - cont metformin and glipizide- focus on diet control - he lives in a shelter and has not much food choices  Neuropathy - - diabetic - Neurontin as above  Homeless/ financial constraints - referral to social work to ensure he will be able to obtain the medications prescribed to him - advised to look for a job once back pain improves  CKD (chronic kidney disease) stage 2, GFR 60-89 ml/min   Return in about 2 months (around 09/11/2013).   The patient was given clear instructions to go to ER or return to medical center if symptoms don't improve, worsen or new problems develop. The patient verbalized understanding. The patient was told to call  to get lab results if they haven't heard anything in the next week.     Debbe Odea, MD

## 2013-07-26 ENCOUNTER — Ambulatory Visit: Payer: No Typology Code available for payment source | Attending: Internal Medicine

## 2013-08-09 ENCOUNTER — Ambulatory Visit: Payer: No Typology Code available for payment source | Attending: Internal Medicine | Admitting: Physical Therapy

## 2013-08-09 DIAGNOSIS — M545 Low back pain, unspecified: Secondary | ICD-10-CM | POA: Insufficient documentation

## 2013-08-09 DIAGNOSIS — Z5189 Encounter for other specified aftercare: Secondary | ICD-10-CM | POA: Insufficient documentation

## 2013-08-09 DIAGNOSIS — R262 Difficulty in walking, not elsewhere classified: Secondary | ICD-10-CM | POA: Insufficient documentation

## 2013-08-09 DIAGNOSIS — M2550 Pain in unspecified joint: Secondary | ICD-10-CM | POA: Insufficient documentation

## 2013-08-10 ENCOUNTER — Encounter (HOSPITAL_COMMUNITY): Payer: Self-pay | Admitting: Emergency Medicine

## 2013-08-10 ENCOUNTER — Ambulatory Visit: Payer: No Typology Code available for payment source

## 2013-08-10 ENCOUNTER — Emergency Department (HOSPITAL_COMMUNITY)
Admission: EM | Admit: 2013-08-10 | Discharge: 2013-08-10 | Disposition: A | Discharge status: Home / Self Care | Payer: No Typology Code available for payment source | Attending: Emergency Medicine | Admitting: Emergency Medicine

## 2013-08-10 DIAGNOSIS — M545 Low back pain, unspecified: Secondary | ICD-10-CM | POA: Insufficient documentation

## 2013-08-10 DIAGNOSIS — M549 Dorsalgia, unspecified: Secondary | ICD-10-CM

## 2013-08-10 DIAGNOSIS — F17200 Nicotine dependence, unspecified, uncomplicated: Secondary | ICD-10-CM | POA: Insufficient documentation

## 2013-08-10 DIAGNOSIS — Z87828 Personal history of other (healed) physical injury and trauma: Secondary | ICD-10-CM | POA: Insufficient documentation

## 2013-08-10 DIAGNOSIS — K055 Other periodontal diseases: Secondary | ICD-10-CM | POA: Insufficient documentation

## 2013-08-10 DIAGNOSIS — K068 Other specified disorders of gingiva and edentulous alveolar ridge: Secondary | ICD-10-CM

## 2013-08-10 DIAGNOSIS — M129 Arthropathy, unspecified: Secondary | ICD-10-CM | POA: Insufficient documentation

## 2013-08-10 DIAGNOSIS — G589 Mononeuropathy, unspecified: Secondary | ICD-10-CM | POA: Insufficient documentation

## 2013-08-10 DIAGNOSIS — I10 Essential (primary) hypertension: Secondary | ICD-10-CM | POA: Insufficient documentation

## 2013-08-10 DIAGNOSIS — Z79899 Other long term (current) drug therapy: Secondary | ICD-10-CM | POA: Insufficient documentation

## 2013-08-10 DIAGNOSIS — K08109 Complete loss of teeth, unspecified cause, unspecified class: Secondary | ICD-10-CM | POA: Insufficient documentation

## 2013-08-10 DIAGNOSIS — Z791 Long term (current) use of non-steroidal anti-inflammatories (NSAID): Secondary | ICD-10-CM | POA: Insufficient documentation

## 2013-08-10 DIAGNOSIS — E119 Type 2 diabetes mellitus without complications: Secondary | ICD-10-CM | POA: Insufficient documentation

## 2013-08-10 DIAGNOSIS — IMO0002 Reserved for concepts with insufficient information to code with codable children: Secondary | ICD-10-CM | POA: Insufficient documentation

## 2013-08-10 DIAGNOSIS — G8929 Other chronic pain: Secondary | ICD-10-CM

## 2013-08-10 MED ORDER — NAPROXEN 500 MG PO TABS: 500.0000 mg | ORAL_TABLET | Freq: Two times a day (BID) | ORAL | Status: DC

## 2013-08-10 MED ORDER — PENICILLIN V POTASSIUM 500 MG PO TABS: 500.0000 mg | ORAL_TABLET | Freq: Four times a day (QID) | ORAL | Status: DC

## 2013-08-10 MED ORDER — KETOROLAC TROMETHAMINE 60 MG/2ML IM SOLN
60.0000 mg | Freq: Once | INTRAMUSCULAR | Status: AC
  Administered 2013-08-10: 60 mg via INTRAMUSCULAR
  Filled 2013-08-10: qty 2

## 2013-08-10 NOTE — Discharge Instructions (Signed)
Back Pain: ° ° °Your back pain should be treated with medicines such as ibuprofen or aleve and this back pain should get better over the next 2 weeks.  However if you develop severe or worsening pain, low back pain with fever, numbness, weakness or inability to walk or urinate, you should return to the ER immediately.  Please follow up with your doctor this week for a recheck if still having symptoms. °Low back pain is discomfort in the lower back that may be due to injuries to muscles and ligaments around the spine.  Occasionally, it may be caused by a a problem to a part of the spine called a disc.  The pain may last several days or a week;  However, most patients get completely well in 4 weeks. ° °Self - care:  The application of heat can help soothe the pain.  Maintaining your daily activities, including walking, is encourged, as it will help you get better faster than just staying in bed. ° °Medications are also useful to help with pain control.  A commonly prescribed medications includes acetaminophen.  This medication is generally safe, though you should not take more than 8 of the extra strength (500mg) pills a day. ° °Non steroidal anti inflammatory medications including Ibuprofen and naproxen;  These medications help both pain and swelling and are very useful in treating back pain.  They should be taken with food, as they can cause stomach upset, and more seriously, stomach bleeding.   ° °Muscle relaxants:  These medications can help with muscle tightness that is a cause of lower back pain.  Most of these medications can cause drowsiness, and it is not safe to drive or use dangerous machinery while taking them. ° °You will need to follow up with  Your primary healthcare provider in 1-2 weeks for reassessment. ° °Be aware that if you develop new symptoms, such as a fever, leg weakness, difficulty with or loss of control of your urine or bowels, abdominal pain, or more severe pain, you will need to seek  medical attention and  / or return to the Emergency department. ° °If you do not have a doctor see the list below. ° °RESOURCE GUIDE ° °Chronic Pain Problems: °Contact [facility]  [phone] °Patients need to be referred by their primary care doctor. ° °Insufficient Money for Medicine: °Contact United Way:  call "211" or [facility] 271-5999. ° °No Primary Care Doctor: °- Call [facility]  832-8000 - can help you locate a primary care doctor that  accepts your insurance, provides certain services, etc. °- Physician Referral Service- 1-800-533-3463 ° °Agencies that provide inexpensive medical care: °- [facility]  [phone] °- [facility]  832-7272 °- [facility]  [phone] °- [facility]  832-4777 °- [facility]  [phone] °- [facility]  [phone] ° °Medicaid-accepting [place] Providers: °- [facility]- [address] ° [phone], Mon-Fri 9am-7pm, Sat 9am-1pm °- [facility]- [address], [address] ° [phone] °- [facility]- [address], [address] ° [phone] °- [facility]- [address] [place] [address] ° [phone] °- [facility]- [address], [address], [phone] ° Only accepts [place] Medicaid patients after they have their name  applied to their card ° °Self Pay (no insurance) in [place]: °- Sickle Cell Patients: Dr Eric Dean, [facility] °   [address], [phone] °- [facility]- [address] ° [phone] °      -     [facility]- [address] °      -     [facility]- see information above (Speak to Pam H if you do not have insurance) °      -  [facility]- [address], [phone] °      -  [facility] [place]- [address],  [phone] °      -  [facility]- [address] [place] [address],  [phone] °      -  Dr Osei-Bonsu-  [address], [place], [phone] °      -  [facility]- [address], [phone] °      -  [facility]- [address] [place] [address], [phone], also [address], [phone] °      -    [facility]- [address], [phone], 1st & 3rd Saturday   every month, 10am-1pm ° °1) Find a Doctor and Pay Out of Pocket °Although you won't have to find out who is covered by your insurance plan, it is a good idea to ask around and get recommendations. You will then need to call the office and see if the doctor you have chosen will accept you as a new patient and what types of options they offer for patients who are self-pay. Some doctors offer discounts or will set up payment plans for their patients who do not have insurance, but you will need to ask so you aren't surprised when you get to your appointment. ° °2) Contact Your Local Health Department °Not all health departments have doctors that can see patients for sick visits, but many do, so it is worth a call to see if yours does. If you don't know where your local health department is, you can check in your phone book. The CDC also has a tool to help you locate your state's health department, and many state websites also have listings of all of their local health departments. ° °3) Find a Walk-in Clinic °If your illness is not likely to be very severe or complicated, you may want to try a walk in clinic. These are popping up all over the country in pharmacies, drugstores, and shopping centers. They're usually staffed by nurse practitioners or physician assistants that have been trained to treat common illnesses and complaints. They're usually fairly quick and inexpensive. However, if you have serious medical issues or chronic medical problems, these are probably not your best option ° °STD Testing °- [place] [facility] [place], [facility], [address], [place], phone [phone] or 1-877-539-9860.  Monday - Friday, call for an appointment. °- [place] [place], [facility], [address], [place], phone [phone] or 1-877-539-9860.  Monday - Friday, call for an appointment. ° °Abuse/Neglect: °- [place] Child Abuse Hotline (336) 641-3795 °- [place] Child Abuse Hotline 800-378-5315 (After Hours) ° °Emergency Shelter:  [facility] (336) 271-5985 ° °Maternity Homes: °- [facility] (336) 275-9566 °- [facility] (704) 372-4663 ° °MRSA Hotline #:   832-7006 ° °[place] Resources ° °[facility] [place]  [employer] [place] [facility] °[address]                 [address]         [address]  °[place]                                               [place]                              [place] °Phone:  349-3220                                  Phone:  342-7768                   Phone:  342-8140 ° °[facility], 342-8316 °- [place] [facility] - [facility]- 1-888-581-9988 °      -     [facility] in [place], [address],                                  336-349-4454, Insurance ° °[place] Child Abuse Hotline °(336) 342-1394 or (336) 342-3537 (After Hours) ° ° °Behavioral Health Services ° °Substance Abuse Resources: °- [facility]  336-882-2125 °- [facility] 336-784-9470 °- [facility] 336-285-9073 °- [facility] 336-845-3988 °- Residential & Outpatient Substance Abuse Program  800-659-3381 ° °Psychological Services: °- [facility]  832-9600 °- [facility]  378-7881 °- [facility], [address], [place], ACCESS LINE: 1-800-853-5163 or 336-641-4981, Http://www.[facility]/services/adult.htm ° °Dental Assistance ° °If unable to pay or  uninsured, contact:  [facility] or [facility] to become qualified for the adult dental clinic. ° °Patients with Medicaid: [facility] °[address], [phone] °[address], [phone] ° °If unable to pay, or uninsured, contact [facility] (271-5999) or [place] (641-3152 in [place], 842-7733 in [place]) to become qualified for the adult dental clinic ° °Other Low-Cost Community Dental Services: °- [facility]- [address], [place], [place], [address], 723-1848, Ext. [address], 2nd and 4th Thursday of the month at 6:30am.  10 clients each day by appointment, can sometimes see walk-in patients if someone does not show for an appointment. °- [facility]- [address], [place], , [address], [phone] °- [facility]- [address], [place], , [address], [phone] °- [facility] [place] [facility]- 342-8273 °- [facility]- [phone] °- [facility]- 570-6415 ° ° ° ° ° ° °

## 2013-08-10 NOTE — ED Notes (Signed)
MD at bedside. 

## 2013-08-10 NOTE — ED Notes (Signed)
Patient here for new onset of upper gum pain and chronic lower back pain.  Patient denies having eating anything new or irritating.  He has not taken any meds prior to arrival.  Patient with no teeth.

## 2013-08-10 NOTE — ED Provider Notes (Signed)
CSN: [id number]     Arrival date & time 08/10/13  0516 History   First MD Initiated Contact with Patient 08/10/13 [phone]     Chief Complaint  Patient presents with  . Dental Pain  . Back Pain     (Consider location/radiation/quality/duration/timing/severity/associated sxs/prior Treatment) HPI Comments: The patient is a 53 year old male, he has a history of chronic back pain, history of recent angioedema which has resolved successfully. He presents tonight because of a complaint of ongoing lower back pain which he states he feels everyday and is unchanged from his chronic pain. He denies any focal neurologic deficits or urinary troubles, no history of fever cancer or IV drug use. The patient also complains of upper abdominal pain. He is edentulous, but has developed soreness of his upper gums over the last couple of days. This is persistent, mild and not associated with fever or sore throat.  Patient is a 53 y.o. male presenting with tooth pain and back pain. The history is provided by the patient.  Dental Pain Associated symptoms: no facial swelling and no fever   Back Pain Associated symptoms: no fever     Past Medical History  Diagnosis Date  . Hypertension   . Cocaine abuse   . Type II diabetes mellitus   . Arthritis     "hands" (07/28/2012)  . Angioedema of lips 07/28/2012    left upper (07/29/2012)  . Neuropathy    Past Surgical History  Procedure Laterality Date  . Inguinal hernia repair  04/01/2012    Procedure: HERNIA REPAIR INGUINAL ADULT;  Surgeon: Haywood Lasso, MD;  Location: [facility];  Service: General;  Laterality: Right;  . Hernia repair Right 04/01/2012  . Incision and drainage of wound      boil on back/notes 07/14/2008 (07/29/2012)  . I&d extremity Left 12/13/2012    Procedure: IRRIGATION AND DEBRIDEMENT LEFT THUMB;  Surgeon: Tennis Must, MD;  Location: [facility];  Service: Orthopedics;  Laterality: Left;   Family History  Problem Relation Age of Onset  . Diabetes  Father    History  Substance Use Topics  . Smoking status: Current Every Day Smoker -- 0.25 packs/day for 30 years    Types: Cigarettes  . Smokeless tobacco: Never Used  . Alcohol Use: No    Review of Systems  Constitutional: Negative for fever.  HENT: Negative for facial swelling.   Musculoskeletal: Positive for back pain.      Allergies  Review of patient's allergies indicates no known allergies.  Home Medications   Prior to Admission medications   Medication Sig Start Date End Date Taking? Authorizing Provider  gabapentin (NEURONTIN) 100 MG capsule Take 1 capsule (100 mg total) by mouth 3 (three) times daily. 07/12/13   Debbe Odea, MD  gabapentin (NEURONTIN) 600 MG tablet Take 600 mg by mouth at bedtime.    Historical Provider, MD  glipiZIDE (GLUCOTROL XL) 10 MG 24 hr tablet Take 2 tablets (20 mg total) by mouth daily with breakfast. 07/12/13   Debbe Odea, MD  meloxicam (MOBIC) 7.5 MG tablet Take 2 tablets (15 mg total) by mouth daily. 06/30/13   Noland Fordyce, PA-C  metFORMIN (GLUCOPHAGE) 1000 MG tablet Take 1 tablet (1,000 mg total) by mouth 2 (two) times daily with a meal. 07/12/13   Debbe Odea, MD  methocarbamol (ROBAXIN) 500 MG tablet Take 2 tablets (1,000 mg total) by mouth 4 (four) times daily. 06/30/13   Noland Fordyce, PA-C  naproxen (NAPROSYN) 250 MG tablet Take 1  tablet (250 mg total) by mouth 2 (two) times daily with a meal. 07/12/13   Debbe Odea, MD  naproxen (NAPROSYN) 500 MG tablet Take 1 tablet (500 mg total) by mouth 2 (two) times daily with a meal. 08/10/13   Johnna Acosta, MD  penicillin v potassium (VEETID) 500 MG tablet Take 1 tablet (500 mg total) by mouth 4 (four) times daily. 08/10/13   Johnna Acosta, MD  predniSONE (DELTASONE) 20 MG tablet Take 2 tablets (40 mg total) by mouth daily with breakfast. 07/12/13   Debbe Odea, MD   BP 107/66  Pulse 81  Temp(Src) 98.1 F (36.7 C) (Oral)  Resp 22  Ht 5\' 8"  (1.727 m)  Wt 165 lb (74.844 kg)  BMI 25.09 kg/m2   SpO2 96% Physical Exam  Nursing note and vitals reviewed. Constitutional: He appears well-developed and well-nourished. No distress.  HENT:  Head: Normocephalic and atraumatic.  Mouth/Throat: Oropharynx is clear and moist. No oropharyngeal exudate.  Teeth are absent universally, upper gumline adjacent to buccal mucosa with 2 small ulcerations, no vesicles, no pustules, no petechiae or purpura, no swelling of the lips tongue or intraoral structures.  Eyes: Conjunctivae and EOM are normal. Pupils are equal, round, and reactive to light. Right eye exhibits no discharge. Left eye exhibits no discharge. No scleral icterus.  Neck: Normal range of motion. Neck supple. No JVD present. No thyromegaly present.  Cardiovascular: Normal rate, regular rhythm, normal heart sounds and intact distal pulses.  Exam reveals no gallop and no friction rub.   No murmur heard. Pulmonary/Chest: Effort normal and breath sounds normal. No respiratory distress. He has no wheezes. He has no rales.  Musculoskeletal: Normal range of motion. He exhibits tenderness (tenderness across the lumbar spine). He exhibits no edema.  Lymphadenopathy:    He has no cervical adenopathy.  Neurological: He is alert. Coordination normal.  Ambulatory without difficulty  Skin: Skin is warm and dry. No rash noted. No erythema.  Psychiatric: He has a normal mood and affect. His behavior is normal.    ED Course  Procedures (including critical care time) Labs Review Labs Reviewed - No data to display  Imaging Review No results found.    MDM   Final diagnoses:  Chronic back pain  Gingival ulcer    The patient has signs of possibly early ANUG, but mild - chronic back pain, meds as below -stable for d/c.  Meds given in ED:  Medications  ketorolac (TORADOL) injection 60 mg (not administered)    New Prescriptions   NAPROXEN (NAPROSYN) 500 MG TABLET    Take 1 tablet (500 mg total) by mouth 2 (two) times daily with a meal.    PENICILLIN V POTASSIUM (VEETID) 500 MG TABLET    Take 1 tablet (500 mg total) by mouth 4 (four) times daily.        Johnna Acosta, MD 08/10/13 [phone]

## 2013-08-13 ENCOUNTER — Encounter (HOSPITAL_COMMUNITY): Payer: Self-pay | Admitting: Emergency Medicine

## 2013-08-13 ENCOUNTER — Emergency Department (HOSPITAL_COMMUNITY)
Admission: EM | Admit: 2013-08-13 | Discharge: 2013-08-13 | Disposition: A | Discharge status: Home / Self Care | Payer: No Typology Code available for payment source | Attending: Emergency Medicine | Admitting: Emergency Medicine

## 2013-08-13 DIAGNOSIS — E119 Type 2 diabetes mellitus without complications: Secondary | ICD-10-CM | POA: Insufficient documentation

## 2013-08-13 DIAGNOSIS — I10 Essential (primary) hypertension: Secondary | ICD-10-CM | POA: Insufficient documentation

## 2013-08-13 DIAGNOSIS — Z79899 Other long term (current) drug therapy: Secondary | ICD-10-CM | POA: Insufficient documentation

## 2013-08-13 DIAGNOSIS — F17200 Nicotine dependence, unspecified, uncomplicated: Secondary | ICD-10-CM | POA: Insufficient documentation

## 2013-08-13 DIAGNOSIS — Z792 Long term (current) use of antibiotics: Secondary | ICD-10-CM | POA: Insufficient documentation

## 2013-08-13 DIAGNOSIS — M19049 Primary osteoarthritis, unspecified hand: Secondary | ICD-10-CM | POA: Insufficient documentation

## 2013-08-13 DIAGNOSIS — IMO0002 Reserved for concepts with insufficient information to code with codable children: Secondary | ICD-10-CM | POA: Insufficient documentation

## 2013-08-13 DIAGNOSIS — Z791 Long term (current) use of non-steroidal anti-inflammatories (NSAID): Secondary | ICD-10-CM | POA: Insufficient documentation

## 2013-08-13 DIAGNOSIS — G8929 Other chronic pain: Secondary | ICD-10-CM | POA: Insufficient documentation

## 2013-08-13 DIAGNOSIS — M549 Dorsalgia, unspecified: Secondary | ICD-10-CM | POA: Insufficient documentation

## 2013-08-13 MED ORDER — NAPROXEN 500 MG PO TABS: 500.0000 mg | ORAL_TABLET | Freq: Two times a day (BID) | ORAL | Status: DC

## 2013-08-13 MED ORDER — TRAMADOL HCL 50 MG PO TABS
50.0000 mg | ORAL_TABLET | Freq: Once | ORAL | Status: AC
  Administered 2013-08-13: 50 mg via ORAL
  Filled 2013-08-13: qty 1

## 2013-08-13 NOTE — ED Provider Notes (Signed)
CSN: [id number]     Arrival date & time 08/13/13  1055 History   First MD Initiated Contact with Patient 08/13/13 1131     Chief Complaint  Patient presents with  . Back Pain     (Consider location/radiation/quality/duration/timing/severity/associated sxs/prior Treatment) HPI  Pt presents to the ER with chronic back pain complaining of worsening back pain. He has a history of spinal stenosis and says that over the last few months the pain has been getting worse. He reports he comes here frequently for this and I can look in the computer to see what medications he gets for pain. When I went into exam room I had to wake patient up from to interview and examine him. Easily aroused by verbal stimuli. He reports weakness in both legs, I watched patient ambulate into room without difficulty. He also reports numbness. Denies bowel or bladder control.  Past Medical History  Diagnosis Date  . Hypertension   . Cocaine abuse   . Type II diabetes mellitus   . Arthritis     "hands" (07/28/2012)  . Angioedema of lips 07/28/2012    left upper (07/29/2012)  . Neuropathy    Past Surgical History  Procedure Laterality Date  . Inguinal hernia repair  04/01/2012    Procedure: HERNIA REPAIR INGUINAL ADULT;  Surgeon: Haywood Lasso, MD;  Location: [facility];  Service: General;  Laterality: Right;  . Hernia repair Right 04/01/2012  . Incision and drainage of wound      boil on back/notes 07/14/2008 (07/29/2012)  . I&d extremity Left 12/13/2012    Procedure: IRRIGATION AND DEBRIDEMENT LEFT THUMB;  Surgeon: Tennis Must, MD;  Location: [facility];  Service: Orthopedics;  Laterality: Left;   Family History  Problem Relation Age of Onset  . Diabetes Father    History  Substance Use Topics  . Smoking status: Current Every Day Smoker -- 0.25 packs/day for 30 years    Types: Cigarettes  . Smokeless tobacco: Never Used  . Alcohol Use: No    Review of Systems   Review of Systems  Gen: no weight loss, fevers,  chills, night sweats  Eyes: no discharge or drainage, no occular pain or visual changes  Nose: no epistaxis or rhinorrhea  Mouth: no dental pain, no sore throat  Neck: no neck pain  Lungs:No wheezing, coughing or hemoptysis CV: no chest pain, palpitations, dependent edema or orthopnea  Abd: no abdominal pain, nausea, vomiting, diarrhea GU: no dysuria or gross hematuria  MSK:  No muscle weakness, + back pain Neuro: no headache, no focal neurologic deficits  Skin: no rash or wounds Psyche: no complaints    Allergies  Review of patient's allergies indicates no known allergies.  Home Medications   Prior to Admission medications   Medication Sig Start Date End Date Taking? Authorizing Provider  gabapentin (NEURONTIN) 100 MG capsule Take 1 capsule (100 mg total) by mouth 3 (three) times daily. 07/12/13   Debbe Odea, MD  gabapentin (NEURONTIN) 600 MG tablet Take 600 mg by mouth at bedtime.    Historical Provider, MD  glipiZIDE (GLUCOTROL XL) 10 MG 24 hr tablet Take 2 tablets (20 mg total) by mouth daily with breakfast. 07/12/13   Debbe Odea, MD  meloxicam (MOBIC) 7.5 MG tablet Take 2 tablets (15 mg total) by mouth daily. 06/30/13   Noland Fordyce, PA-C  metFORMIN (GLUCOPHAGE) 1000 MG tablet Take 1 tablet (1,000 mg total) by mouth 2 (two) times daily with a meal. 07/12/13   Saima  Rizwan, MD  methocarbamol (ROBAXIN) 500 MG tablet Take 2 tablets (1,000 mg total) by mouth 4 (four) times daily. 06/30/13   Noland Fordyce, PA-C  naproxen (NAPROSYN) 250 MG tablet Take 1 tablet (250 mg total) by mouth 2 (two) times daily with a meal. 07/12/13   Debbe Odea, MD  naproxen (NAPROSYN) 500 MG tablet Take 1 tablet (500 mg total) by mouth 2 (two) times daily with a meal. 08/10/13   Johnna Acosta, MD  naproxen (NAPROSYN) 500 MG tablet Take 1 tablet (500 mg total) by mouth 2 (two) times daily. 08/13/13   Linus Mako, PA-C  penicillin v potassium (VEETID) 500 MG tablet Take 1 tablet (500 mg total) by mouth 4 (four)  times daily. 08/10/13   Johnna Acosta, MD  predniSONE (DELTASONE) 20 MG tablet Take 2 tablets (40 mg total) by mouth daily with breakfast. 07/12/13   Debbe Odea, MD   BP 112/60  Pulse 76  Temp(Src) 98.3 F (36.8 C) (Oral)  Resp 16  SpO2 98% Physical Exam  Nursing note and vitals reviewed. Constitutional: He appears well-developed and well-nourished. No distress.  HENT:  Head: Normocephalic and atraumatic.  Eyes: Pupils are equal, round, and reactive to light.  Neck: Normal range of motion. Neck supple.  Cardiovascular: Normal rate and regular rhythm.   Pulmonary/Chest: Effort normal.  Abdominal: Soft.  Musculoskeletal:  Pt has equal strength to bilateral lower extremities.  Strength as expected, able to hold both legs off the exam table individually. Neurosensory function adequate to both legs No clonus on dorsiflextion Skin color is normal. Skin is warm and moist.  I see no step off deformity, no midline bony tenderness.  Pt is able to ambulate.  No crepitus, laceration, effusion, induration, lesions, swelling.   Pedal pulses are symmetrical and palpable bilaterally  Mild  tenderness to palpation of paraspinal and midline lumbar muscles   Neurological: He is alert.  Skin: Skin is warm and dry.     ED Course  Procedures (including critical care time) Labs Review Labs Reviewed - No data to display  Imaging Review No results found.   EKG Interpretation None      MDM   Final diagnoses:  Chronic back pain    53 y.o.David Wyatt's  with back pain. No neurological deficits and normal neuro exam. Patient can walk but states is painful. No loss of bowel or bladder control. No concern for cauda equina. No fever, night sweats, weight loss, h/o cancer, IVDU. RICE protocol and pain medicine indicated and discussed with patient.   Patient Plan 1. Medications: pain medication (Naprosyn ) and usual home medications  2. Treatment: rest, drink plenty of fluids, gentle  stretching as discussed, alternate ice and heat  3. Follow Up: Please followup with your primary doctor for discussion of your diagnoses and further evaluation after today's visit; if you do not have a primary care doctor use the resource guide provided to find one   Vital signs are stable at discharge. Filed Vitals:   08/13/13 1110  BP: 112/60  Pulse: 76  Temp: 98.3 F (36.8 C)  Resp: 16    Patient/guardian has voiced understanding and agreed to follow-up with the PCP or specialist.          Linus Mako, PA-C 08/13/13 1216

## 2013-08-13 NOTE — ED Notes (Signed)
PA @ bedside.

## 2013-08-13 NOTE — ED Provider Notes (Signed)
Medical screening examination/treatment/procedure(s) were performed by non-physician practitioner and as supervising physician I was immediately available for consultation/collaboration.   EKG Interpretation None        Ephraim Hamburger, MD 08/13/13 1616

## 2013-08-13 NOTE — ED Notes (Signed)
Pt alert, nda, resp even unlabored, skin pwd, ambulates to discharge

## 2013-08-13 NOTE — Discharge Instructions (Signed)
Back Pain, Adult Low back pain is very common. About 1 in 5 people have back pain.The cause of low back pain is rarely dangerous. The pain often gets better over time.About half of people with a sudden onset of back pain feel better in just 2 weeks. About 8 in 10 people feel better by 6 weeks.  CAUSES Some common causes of back pain include:  Strain of the muscles or ligaments supporting the spine.  Wear and tear (degeneration) of the spinal discs.  Arthritis.  Direct injury to the back. DIAGNOSIS Most of the time, the direct cause of low back pain is not known.However, back pain can be treated effectively even when the exact cause of the pain is unknown.Answering your caregiver's questions about your overall health and symptoms is one of the most accurate ways to make sure the cause of your pain is not dangerous. If your caregiver needs more information, he or she may order lab work or imaging tests (X-rays or MRIs).However, even if imaging tests show changes in your back, this usually does not require surgery. HOME CARE INSTRUCTIONS For many people, back pain returns.Since low back pain is rarely dangerous, it is often a condition that people can learn to manageon their own.   Remain active. It is stressful on the back to sit or stand in one place. Do not sit, drive, or stand in one place for more than 30 minutes at a time. Take short walks on level surfaces as soon as pain allows.Try to increase the length of time you walk each day.  Do not stay in bed.Resting more than 1 or 2 days can delay your recovery.  Do not avoid exercise or work.Your body is made to move.It is not dangerous to be active, even though your back may hurt.Your back will likely heal faster if you return to being active before your pain is gone.  Pay attention to your body when you bend and lift. Many people have less discomfortwhen lifting if they bend their knees, keep the load close to their bodies,and  avoid twisting. Often, the most comfortable positions are those that put less stress on your recovering back.  Find a comfortable position to sleep. Use a firm mattress and lie on your side with your knees slightly bent. If you lie on your back, put a pillow under your knees.  Only take over-the-counter or prescription medicines as directed by your caregiver. Over-the-counter medicines to reduce pain and inflammation are often the most helpful.Your caregiver may prescribe muscle relaxant drugs.These medicines help dull your pain so you can more quickly return to your normal activities and healthy exercise.  Put ice on the injured area.  Put ice in a plastic bag.  Place a towel between your skin and the bag.  Leave the ice on for 15-20 minutes, 03-04 times a day for the first 2 to 3 days. After that, ice and heat may be alternated to reduce pain and spasms.  Ask your caregiver about trying back exercises and gentle massage. This may be of some benefit.  Avoid feeling anxious or stressed.Stress increases muscle tension and can worsen back pain.It is important to recognize when you are anxious or stressed and learn ways to manage it.Exercise is a great option. SEEK MEDICAL CARE IF:  You have pain that is not relieved with rest or medicine.  You have pain that does not improve in 1 week.  You have new symptoms.  You are generally not feeling well. SEEK   IMMEDIATE MEDICAL CARE IF:   You have pain that radiates from your back into your legs.  You develop new bowel or bladder control problems.  You have unusual weakness or numbness in your arms or legs.  You develop nausea or vomiting.  You develop abdominal pain.  You feel faint. Document Released: 02/25/2005 Document Revised: 08/27/2011 Document Reviewed: 07/16/2010 [employer] Patient Information 2014 [employer].  

## 2013-08-13 NOTE — Progress Notes (Signed)
[facility] CL spoke with patient about [facility]. Patient has [place] with [facility], but is currently being seen at [facility]. Patient has an upcoming apt with them on 7/6/ at 10:45 am.

## 2013-08-13 NOTE — ED Notes (Signed)
Pt with hx of spinal stenosis that has had an increase in pain in the last week. Pain in lower back that is radiating down both legs with numbness and weakness in both legs. Denis bowel or bladder problems.

## 2013-08-15 ENCOUNTER — Encounter (HOSPITAL_COMMUNITY): Payer: Self-pay | Admitting: Emergency Medicine

## 2013-08-15 ENCOUNTER — Emergency Department (INDEPENDENT_AMBULATORY_CARE_PROVIDER_SITE_OTHER)
Admission: EM | Admit: 2013-08-15 | Discharge: 2013-08-15 | Disposition: A | Discharge status: Home / Self Care | Payer: Self-pay | Source: Home / Self Care | Attending: Family Medicine | Admitting: Family Medicine

## 2013-08-15 DIAGNOSIS — N189 Chronic kidney disease, unspecified: Secondary | ICD-10-CM

## 2013-08-15 DIAGNOSIS — E119 Type 2 diabetes mellitus without complications: Secondary | ICD-10-CM

## 2013-08-15 DIAGNOSIS — M549 Dorsalgia, unspecified: Secondary | ICD-10-CM

## 2013-08-15 DIAGNOSIS — G8929 Other chronic pain: Secondary | ICD-10-CM

## 2013-08-15 HISTORY — DX: Other chronic pain: G89.29

## 2013-08-15 HISTORY — DX: Dorsalgia, unspecified: M54.9

## 2013-08-15 LAB — POCT I-STAT, CHEM 8
BUN: 20 mg/dL (ref 6–23)
CALCIUM ION: 1.14 mmol/L (ref 1.12–1.23)
Chloride: 109 mEq/L (ref 96–112)
Creatinine, Ser: 1.4 mg/dL — ABNORMAL HIGH (ref 0.50–1.35)
Glucose, Bld: 158 mg/dL — ABNORMAL HIGH (ref 70–99)
HEMATOCRIT: 47 % (ref 39.0–52.0)
HEMOGLOBIN: 16 g/dL (ref 13.0–17.0)
Potassium: 4.2 mEq/L (ref 3.7–5.3)
Sodium: 137 mEq/L (ref 137–147)
TCO2: 25 mmol/L (ref 0–100)

## 2013-08-15 MED ORDER — PREDNISONE (PAK) 10 MG PO TABS: ORAL_TABLET | ORAL | Status: AC

## 2013-08-15 NOTE — ED Notes (Signed)
Pt states he has spinal stenosis and is here because of his low back pain.  When asked if he called his physician this week, states he did not.  Asked about 2 previous ED visits - "Oh, you know about that?".  States the tramadol inj he was given "did nothing for me".  States today his pain is on the right side, and it is usually on the left side.  Pt wheeled himself to bathroom using RLE to push/pull himself along in the wheelchair.  When asked if he has taken any meds to help with pain, states he wasn't given anything.  Asked pt about the Naprosyn Rxs he received - states he never got them filled.

## 2013-08-15 NOTE — ED Provider Notes (Signed)
Medical screening examination/treatment/procedure(s) were performed by a resident physician or non-physician practitioner and as the supervising physician I was immediately available for consultation/collaboration.  Lynne Leader, MD    Gregor Hams, MD 08/15/13 [phone]

## 2013-08-15 NOTE — Discharge Instructions (Signed)
Chronic Back Pain  When back pain lasts longer than 3 months, it is called chronic back pain.People with chronic back pain often go through certain periods that are more intense (flare-ups).  CAUSES Chronic back pain can be caused by wear and tear (degeneration) on different structures in your back. These structures include:  The bones of your spine (vertebrae) and the joints surrounding your spinal cord and nerve roots (facets).  The strong, fibrous tissues that connect your vertebrae (ligaments). Degeneration of these structures may result in pressure on your nerves. This can lead to constant pain. HOME CARE INSTRUCTIONS  Avoid bending, heavy lifting, prolonged sitting, and activities which make the problem worse.  Take brief periods of rest throughout the day to reduce your pain. Lying down or standing usually is better than sitting while you are resting.  Take over-the-counter or prescription medicines only as directed by your caregiver. SEEK IMMEDIATE MEDICAL CARE IF:   You have weakness or numbness in one of your legs or feet.  You have trouble controlling your bladder or bowels.  You have nausea, vomiting, abdominal pain, shortness of breath, or fainting. Document Released: 04/04/2004 Document Revised: 05/20/2011 Document Reviewed: 02/09/2011 [facility] Patient Information 2014 [place], [place].   Discuss further options for you with your primary care physician

## 2013-08-15 NOTE — ED Provider Notes (Signed)
CSN: [id number]     Arrival date & time 08/15/13  1221 History   First MD Initiated Contact with Patient 08/15/13 1302     Chief Complaint  Patient presents with  . Back Pain   (Consider location/radiation/quality/duration/timing/severity/associated sxs/prior Treatment) HPI Comments: David Wyatt is a black male with DM, and CKD who presents with continued chronic low back pain. He has seen [facility] and cannot afford Surgery. He cannot afford a pain clinic. He states that he has not discussed further management with his PCP; which is the [place] [facility]. He is frustrated. He reports chronic weakness in his left lower extremity. No numbness. HE has known spinal stenosis.   Patient is a 54 y.o. male presenting with back pain. The history is provided by the patient.  Back Pain   Past Medical History  Diagnosis Date  . Hypertension   . Cocaine abuse   . Type II diabetes mellitus   . Arthritis     "hands" (07/28/2012)  . Angioedema of lips 07/28/2012    left upper (07/29/2012)  . Neuropathy   . Chronic back pain    Past Surgical History  Procedure Laterality Date  . Inguinal hernia repair  04/01/2012    Procedure: HERNIA REPAIR INGUINAL ADULT;  Surgeon: Haywood Lasso, MD;  Location: [place];  Service: General;  Laterality: Right;  . Hernia repair Right 04/01/2012  . Incision and drainage of wound      boil on back/notes 07/14/2008 (07/29/2012)  . I&d extremity Left 12/13/2012    Procedure: IRRIGATION AND DEBRIDEMENT LEFT THUMB;  Surgeon: Tennis Must, MD;  Location: [place];  Service: Orthopedics;  Laterality: Left;   Family History  Problem Relation Age of Onset  . Diabetes Father    History  Substance Use Topics  . Smoking status: Current Every Day Smoker -- 0.25 packs/day for 30 years    Types: Cigarettes  . Smokeless tobacco: Never Used  . Alcohol Use: No    Review of Systems  Musculoskeletal: Positive for back pain.  All other systems reviewed and are negative.   Allergies   Review of patient's allergies indicates no known allergies.  Home Medications   Prior to Admission medications   Medication Sig Start Date End Date Taking? Authorizing Provider  gabapentin (NEURONTIN) 100 MG capsule Take 1 capsule (100 mg total) by mouth 3 (three) times daily. 07/12/13  Yes Debbe Odea, MD  glipiZIDE (GLUCOTROL XL) 10 MG 24 hr tablet Take 20 mg by mouth daily with breakfast. Per medical record 07/12/13  Yes Debbe Odea, MD  metFORMIN (GLUCOPHAGE) 1000 MG tablet Take 1,000 mg by mouth 2 (two) times daily with a meal. Per medical record 07/12/13  Yes Debbe Odea, MD  gabapentin (NEURONTIN) 600 MG tablet Take 600 mg by mouth at bedtime.    Historical Provider, MD  meloxicam (MOBIC) 7.5 MG tablet Take 2 tablets (15 mg total) by mouth daily. 06/30/13   Noland Fordyce, PA-C  methocarbamol (ROBAXIN) 500 MG tablet Take 2 tablets (1,000 mg total) by mouth 4 (four) times daily. 06/30/13   Noland Fordyce, PA-C  naproxen (NAPROSYN) 250 MG tablet Take 250 mg by mouth 2 (two) times daily with a meal. 07/12/13   Debbe Odea, MD  naproxen (NAPROSYN) 500 MG tablet Take 1 tablet (500 mg total) by mouth 2 (two) times daily with a meal. 08/10/13   Johnna Acosta, MD  naproxen (NAPROSYN) 500 MG tablet Take 1 tablet (500 mg total) by mouth 2 (two) times  daily. 08/13/13   Linus Mako, PA-C  penicillin v potassium (VEETID) 500 MG tablet Take 1 tablet (500 mg total) by mouth 4 (four) times daily. 08/10/13   Johnna Acosta, MD  predniSONE (DELTASONE) 20 MG tablet Take 2 tablets (40 mg total) by mouth daily with breakfast. 07/12/13   Debbe Odea, MD  predniSONE (STERAPRED UNI-PAK) 10 MG tablet Take pack as directed for 12 days 08/15/13 08/26/13  Bjorn Pippin, PA-C   BP 134/82  Pulse 80  Temp(Src) 97.6 F (36.4 C) (Oral)  Resp 18  SpO2 95% Physical Exam  Nursing note and vitals reviewed. Constitutional: He is oriented to person, place, and time. He appears well-developed and well-nourished. No distress.   In a wheelchair. Can stand and walk on demand  HENT:  Head: Normocephalic and atraumatic.  Neck: Normal range of motion.  Pulmonary/Chest: Effort normal.  Musculoskeletal: He exhibits tenderness. He exhibits no edema.  No spasms to palpation. Decreased painful ROM. No obvious motor weakness.   Neurological: He is alert and oriented to person, place, and time. He exhibits normal muscle tone. Coordination normal.  Skin: Skin is warm and dry. He is not diaphoretic.  Psychiatric: His behavior is normal.    ED Course  Procedures (including critical care time) Labs Review Labs Reviewed  POCT I-STAT, CHEM 8 - Abnormal; Notable for the following:    Creatinine, Ser 1.40 (*)    Glucose, Bld 158 (*)    All other components within normal limits    Imaging Review No results found.   MDM   1. Chronic back pain   2. Chronic kidney disease (CKD)   3. Diabetes    Mild renal insufficiency would conclude only limited use of NSAIDs which were given a few days ago. These are contraindicated at this point; however would discuss further with the PCP; instruction given. No narcotics given and I explained that routine PCP should assist with this if that is his ONLY option as he tells me he is not a surgical candidate. Will give a Pred PAck today for acute inflammation however he will need to watch and monitor his glucose regularly on this (he lives in a shelter). We had a long discussion today about follow up.     Bjorn Pippin, PA-C 08/15/13 1423

## 2013-08-16 ENCOUNTER — Ambulatory Visit: Payer: No Typology Code available for payment source

## 2013-08-17 ENCOUNTER — Ambulatory Visit: Payer: No Typology Code available for payment source

## 2013-08-19 ENCOUNTER — Ambulatory Visit: Payer: No Typology Code available for payment source

## 2013-08-24 ENCOUNTER — Ambulatory Visit: Payer: No Typology Code available for payment source | Admitting: Physical Therapy

## 2013-08-26 ENCOUNTER — Ambulatory Visit: Payer: No Typology Code available for payment source

## 2013-08-26 ENCOUNTER — Encounter: Payer: Self-pay | Admitting: Emergency Medicine

## 2013-08-26 ENCOUNTER — Other Ambulatory Visit: Payer: Self-pay | Admitting: Emergency Medicine

## 2013-08-26 ENCOUNTER — Telehealth: Payer: Self-pay | Admitting: Internal Medicine

## 2013-08-26 MED ORDER — TRAMADOL HCL 50 MG PO TABS: 50.0000 mg | ORAL_TABLET | Freq: Three times a day (TID) | ORAL | Status: DC | PRN

## 2013-08-26 NOTE — Telephone Encounter (Signed)
Pt was seen in clinic and prescribed Tramadol for pain. Pt also met with LSW, Ty[place] for resource needs

## 2013-08-26 NOTE — Telephone Encounter (Signed)
Pt has come in today to see if he can get an appointment with the PCP; pt states that he has been going to therapy with no relief for his pain;

## 2013-08-26 NOTE — Progress Notes (Signed)
Pt comes in with chronic back pain radiating to left leg for months with abnormal MRI results. Pt has been to ER multiple times for same sx's. Pt was prescribed Naprosyn for pain management. Pt was told per ER [facility] to stop coming everyday and f/u with pcp Pt has scheduled appt with Dr. Annitta Needs 09/13/13 Pt is unable to afford surgery or pain clinic.

## 2013-08-26 NOTE — Progress Notes (Signed)
Patient ID: David Wyatt, male   DOB: 1960/09/09, 53 y.o.   MRN: [id number] Pt comes in as walk in with c/o lower back pain radiating to left leg which is chronic pain Pt was seen multiple times in ER for same sx's and prescribed Naprosyn 500 mg tab.

## 2013-08-31 ENCOUNTER — Ambulatory Visit: Payer: No Typology Code available for payment source

## 2013-09-02 ENCOUNTER — Ambulatory Visit: Payer: No Typology Code available for payment source

## 2013-09-08 ENCOUNTER — Ambulatory Visit: Payer: No Typology Code available for payment source | Attending: Internal Medicine | Admitting: Physical Therapy

## 2013-09-08 DIAGNOSIS — R262 Difficulty in walking, not elsewhere classified: Secondary | ICD-10-CM | POA: Insufficient documentation

## 2013-09-08 DIAGNOSIS — M2550 Pain in unspecified joint: Secondary | ICD-10-CM | POA: Insufficient documentation

## 2013-09-08 DIAGNOSIS — M545 Low back pain, unspecified: Secondary | ICD-10-CM | POA: Insufficient documentation

## 2013-09-08 DIAGNOSIS — Z5189 Encounter for other specified aftercare: Secondary | ICD-10-CM | POA: Insufficient documentation

## 2013-09-12 ENCOUNTER — Emergency Department (HOSPITAL_COMMUNITY): Payer: No Typology Code available for payment source

## 2013-09-12 ENCOUNTER — Encounter (HOSPITAL_COMMUNITY): Payer: Self-pay | Admitting: Emergency Medicine

## 2013-09-12 ENCOUNTER — Emergency Department (HOSPITAL_COMMUNITY)
Admission: EM | Admit: 2013-09-12 | Discharge: 2013-09-12 | Disposition: A | Discharge status: Home / Self Care | Payer: No Typology Code available for payment source | Attending: Emergency Medicine | Admitting: Emergency Medicine

## 2013-09-12 DIAGNOSIS — Z791 Long term (current) use of non-steroidal anti-inflammatories (NSAID): Secondary | ICD-10-CM | POA: Insufficient documentation

## 2013-09-12 DIAGNOSIS — Z792 Long term (current) use of antibiotics: Secondary | ICD-10-CM | POA: Insufficient documentation

## 2013-09-12 DIAGNOSIS — G8929 Other chronic pain: Secondary | ICD-10-CM | POA: Insufficient documentation

## 2013-09-12 DIAGNOSIS — F17200 Nicotine dependence, unspecified, uncomplicated: Secondary | ICD-10-CM | POA: Insufficient documentation

## 2013-09-12 DIAGNOSIS — Z79899 Other long term (current) drug therapy: Secondary | ICD-10-CM | POA: Insufficient documentation

## 2013-09-12 DIAGNOSIS — M129 Arthropathy, unspecified: Secondary | ICD-10-CM | POA: Insufficient documentation

## 2013-09-12 DIAGNOSIS — E119 Type 2 diabetes mellitus without complications: Secondary | ICD-10-CM | POA: Insufficient documentation

## 2013-09-12 DIAGNOSIS — I10 Essential (primary) hypertension: Secondary | ICD-10-CM | POA: Insufficient documentation

## 2013-09-12 DIAGNOSIS — M5442 Lumbago with sciatica, left side: Secondary | ICD-10-CM

## 2013-09-12 DIAGNOSIS — M5430 Sciatica, unspecified side: Secondary | ICD-10-CM | POA: Insufficient documentation

## 2013-09-12 DIAGNOSIS — IMO0002 Reserved for concepts with insufficient information to code with codable children: Secondary | ICD-10-CM | POA: Insufficient documentation

## 2013-09-12 MED ORDER — OXYCODONE-ACETAMINOPHEN 5-325 MG PO TABS
2.0000 | ORAL_TABLET | Freq: Once | ORAL | Status: AC
  Administered 2013-09-12: 2 via ORAL
  Filled 2013-09-12: qty 2

## 2013-09-12 MED ORDER — CYCLOBENZAPRINE HCL 5 MG PO TABS: 5.0000 mg | ORAL_TABLET | Freq: Three times a day (TID) | ORAL | Status: DC | PRN

## 2013-09-12 MED ORDER — NAPROXEN 500 MG PO TABS: 500.0000 mg | ORAL_TABLET | Freq: Two times a day (BID) | ORAL | Status: DC

## 2013-09-12 NOTE — ED Notes (Signed)
Pt states that he wanted to talk to the PA at this time concerning a prescription

## 2013-09-12 NOTE — ED Notes (Signed)
The pt is c/o lower back pain for years.  He reports that he has spinal stenosis and pain med

## 2013-09-12 NOTE — ED Notes (Signed)
Dr Vanita Panda in to talk to pt

## 2013-09-12 NOTE — ED Provider Notes (Signed)
CSN: [id number]     Arrival date & time 09/12/13  1509 History  This chart was scribed for non-physician practitioner [name], PA-C, working with Carmin Muskrat, MD, by Neta Ehlers, ED Scribe. This patient was seen in room TR07C/TR07C and the patient's care was started at 5:13 PM.   First MD Initiated Contact with Patient 09/12/13 1649     Chief Complaint  Patient presents with  . Back Pain    The history is provided by the patient. No language interpreter was used.   HPI Comments: David Wyatt is a 53 y.o. male, with a h/o chronic back pain, DM, a drug abuse, who presents to the Emergency Department complaining of chronic left lower low back pain which radiates down his left leg. Pain is chronic and unchanged, however, he endorses a new symptom of intermittent isolated weakness to his leg with weight-bearing. States sometimes when he walks his left leg "gives out." He has used gabapentin without relief. He reports he has been using a crutch to assist with ambulation. The pt reports he wants to have back surgery. He denies numbness, paresthesia, urinary/bowel incontinence, difficulty urinating, hip pain, fever, nausea, emesis, abdominal pain, or chest pain. He denies a h/o cancer or IV drug usage. No recent trauma or injuries.   Per medical records, pt had a MRI of his lumbar spine in March of this year.   Past Medical History  Diagnosis Date  . Hypertension   . Cocaine abuse   . Type II diabetes mellitus   . Arthritis     "hands" (07/28/2012)  . Angioedema of lips 07/28/2012    left upper (07/29/2012)  . Neuropathy   . Chronic back pain    Past Surgical History  Procedure Laterality Date  . Inguinal hernia repair  04/01/2012    Procedure: HERNIA REPAIR INGUINAL ADULT;  Surgeon: Haywood Lasso, MD;  Location: [place];  Service: General;  Laterality: Right;  . Hernia repair Right 04/01/2012  . Incision and drainage of wound      boil on back/notes 07/14/2008 (07/29/2012)  .  I&d extremity Left 12/13/2012    Procedure: IRRIGATION AND DEBRIDEMENT LEFT THUMB;  Surgeon: Tennis Must, MD;  Location: [place];  Service: Orthopedics;  Laterality: Left;   Family History  Problem Relation Age of Onset  . Diabetes Father    History  Substance Use Topics  . Smoking status: Current Every Day Smoker -- 0.25 packs/day for 30 years    Types: Cigarettes  . Smokeless tobacco: Never Used  . Alcohol Use: No    Review of Systems  Constitutional: Negative for fever, chills, activity change, appetite change and fatigue.  Respiratory: Negative for shortness of breath.   Cardiovascular: Negative for chest pain.  Gastrointestinal: Negative for nausea, vomiting and abdominal pain.  Genitourinary: Negative for dysuria, urgency and difficulty urinating.  Musculoskeletal: Positive for back pain. Negative for myalgias and neck pain.  Skin: Negative for color change and wound.  Neurological: Positive for weakness. Negative for numbness.    Allergies  Review of patient's allergies indicates no known allergies.  Home Medications   Prior to Admission medications   Medication Sig Start Date End Date Taking? Authorizing Provider  gabapentin (NEURONTIN) 100 MG capsule Take 1 capsule (100 mg total) by mouth 3 (three) times daily. 07/12/13   Debbe Odea, MD  gabapentin (NEURONTIN) 600 MG tablet Take 600 mg by mouth at bedtime.    Historical Provider, MD  glipiZIDE (GLUCOTROL XL) 10  MG 24 hr tablet Take 20 mg by mouth daily with breakfast. Per medical record 07/12/13   Debbe Odea, MD  meloxicam (MOBIC) 7.5 MG tablet Take 2 tablets (15 mg total) by mouth daily. 06/30/13   Noland Fordyce, PA-C  metFORMIN (GLUCOPHAGE) 1000 MG tablet Take 1,000 mg by mouth 2 (two) times daily with a meal. Per medical record 07/12/13   Debbe Odea, MD  methocarbamol (ROBAXIN) 500 MG tablet Take 2 tablets (1,000 mg total) by mouth 4 (four) times daily. 06/30/13   Noland Fordyce, PA-C  naproxen (NAPROSYN) 250 MG tablet  Take 250 mg by mouth 2 (two) times daily with a meal. 07/12/13   Debbe Odea, MD  naproxen (NAPROSYN) 500 MG tablet Take 1 tablet (500 mg total) by mouth 2 (two) times daily with a meal. 08/10/13   Johnna Acosta, MD  naproxen (NAPROSYN) 500 MG tablet Take 1 tablet (500 mg total) by mouth 2 (two) times daily. 08/13/13   Linus Mako, PA-C  penicillin v potassium (VEETID) 500 MG tablet Take 1 tablet (500 mg total) by mouth 4 (four) times daily. 08/10/13   Johnna Acosta, MD  predniSONE (DELTASONE) 20 MG tablet Take 2 tablets (40 mg total) by mouth daily with breakfast. 07/12/13   Debbe Odea, MD  traMADol (ULTRAM) 50 MG tablet Take 1 tablet (50 mg total) by mouth every 8 (eight) hours as needed. 08/26/13   Angelica Chessman, MD    Triage Vitals: BP 147/87  Pulse 70  Temp(Src) 98.5 F (36.9 C)  Resp 18  Ht 5\' 8"  (1.727 m)  Wt 170 lb (77.111 kg)  BMI 25.85 kg/m2  SpO2 100%  Filed Vitals:   09/12/13 1515 09/12/13 2035  BP: 147/87 137/84  Pulse: 70 74  Temp: 98.5 F (36.9 C) 98.2 F (36.8 C)  Resp: 18   Height: 5\' 8"  (1.727 m)   Weight: 170 lb (77.111 kg)   SpO2: 100% 99%    Physical Exam  Nursing note and vitals reviewed. Constitutional: He is oriented to person, place, and time. He appears well-developed and well-nourished. No distress.  HENT:  Head: Normocephalic and atraumatic.  Right Ear: External ear normal.  Left Ear: External ear normal.  Mouth/Throat: Oropharynx is clear and moist.  Eyes: Conjunctivae are normal. Right eye exhibits no discharge. Left eye exhibits no discharge.  Neck: Normal range of motion. Neck supple.  Cardiovascular: Normal rate, regular rhythm, normal heart sounds and intact distal pulses.  Exam reveals no gallop and no friction rub.   No murmur heard. Dorsalis pedis pulses present and equal bilaterally  Pulmonary/Chest: Effort normal and breath sounds normal. No respiratory distress. He has no wheezes. He has no rales. He exhibits no tenderness.   Abdominal: Soft. He exhibits no distension. There is no tenderness.  Musculoskeletal: Normal range of motion. He exhibits tenderness. He exhibits no edema.       Arms: Tenderness to palpation to the left lower lumbar paraspinal region. No left hip or LE tenderness. Positive straight leg raise on the left. No tenderness to palpation to the thoracic or lumbar spinous processes throughout. Strength 5/5 in the upper and lower extremities bilaterally. Patient able to ambulate without difficulty or ataxia. No LE edema or calf tenderness bilaterally  Neurological: He is alert and oriented to person, place, and time.  Sensation intact in the LE bilaterally. Patellar reflexes intact bilaterally  Skin: Skin is warm and dry. He is not diaphoretic.     ED Course  Procedures (  including critical care time)  DIAGNOSTIC STUDIES: Oxygen Saturation is 100% on room air, normal by my interpretation.    COORDINATION OF CARE:  5:21 PM- Discussed treatment plan with patient, and the patient agreed to the plan. The plan includes imaging and pain medication.   Labs Review Labs Reviewed - No data to display  Imaging Review Dg Lumbar Spine Complete  09/12/2013   CLINICAL DATA:  Low back pain.  EXAM: LUMBAR SPINE - COMPLETE 4+ VIEW  COMPARISON:  06/08/2013.  FINDINGS: Normal alignment of the lumbar vertebral bodies. Disc spaces and vertebral bodies are maintained. No acute bony findings or destructive bony changes. The facets are normally aligned. No pars defects. Vascular calcifications are noted. The visualized bony pelvis is intact.  IMPRESSION: Normal alignment and no acute bony findings.  Mild degenerative changes.   Electronically Signed   By: Kalman Jewels M.D.   On: 09/12/2013 17:55     EKG Interpretation None      MDM   Alyxander Kollmann is a 53 y.o. male with a h/o chronic back pain, DM, a drug abuse, who presents to the Emergency Department complaining of chronic low back pain which radiates down  his left leg. Pain likely chronic in nature. Patient has had multiple visits for this in the past. Previous MRI results reviewed. Patient insisted upon x-rays which showed mild degenerative changes with no acute findings. Patient neurovascularly intact. No warning signs or symptoms of back pain including loss of bowel or bladder control, night sweats, waking from sleep with back pain, unexplained fevers or weight loss, history of cancer, or IV drug use. No concern for cauda equina, epidural abscess, or other serious/life threatening cause of back pain. Patient prescribed naprosyn and flexeril for OP pain control. Patient afebrile and non-toxic in appearance. Return precautions, discharge instructions, and follow-up was discussed with the patient before discharge.      Final impressions: 1. Left-sided low back pain with left-sided sciatica      Mercy Moore PA-C           [name], PA-C 09/14/13 [phone]

## 2013-09-12 NOTE — ED Notes (Signed)
Pt offered a wheelchair for discharge which he refused.  Pt ambulated one his own a distance of 20 feet and stated that he felt his back going out.  Pt stated he wanted to talk to the PA again and was not ready to be discharged at this time.  Pt offered a wheelchair a second time which he refused.  Pt ambulated back to room under his own power without issue

## 2013-09-12 NOTE — Discharge Instructions (Signed)
Back Pain, Adult  Low back pain is very common. About 1 in 5 people have back pain. The cause of low back pain is rarely dangerous. The pain often gets better over time. About half of people with a sudden onset of back pain feel better in just 2 weeks. About 8 in 10 people feel better by 6 weeks.   CAUSES  Some common causes of back pain include:  · Strain of the muscles or ligaments supporting the spine.  · Wear and tear (degeneration) of the spinal discs.  · Arthritis.  · Direct injury to the back.  DIAGNOSIS  Most of the time, the direct cause of low back pain is not known. However, back pain can be treated effectively even when the exact cause of the pain is unknown. Answering your caregiver's questions about your overall health and symptoms is one of the most accurate ways to make sure the cause of your pain is not dangerous. If your caregiver needs more information, he or she may order lab work or imaging tests (X-rays or MRIs). However, even if imaging tests show changes in your back, this usually does not require surgery.  HOME CARE INSTRUCTIONS  For many people, back pain returns. Since low back pain is rarely dangerous, it is often a condition that people can learn to manage on their own.   · Remain active. It is stressful on the back to sit or stand in one place. Do not sit, drive, or stand in one place for more than 30 minutes at a time. Take short walks on level surfaces as soon as pain allows. Try to increase the length of time you walk each day.  · Do not stay in bed. Resting more than 1 or 2 days can delay your recovery.  · Do not avoid exercise or work. Your body is made to move. It is not dangerous to be active, even though your back may hurt. Your back will likely heal faster if you return to being active before your pain is gone.  · Pay attention to your body when you  bend and lift. Many people have less discomfort when lifting if they bend their knees, keep the load close to their bodies, and  avoid twisting. Often, the most comfortable positions are those that put less stress on your recovering back.  · Find a comfortable position to sleep. Use a firm mattress and lie on your side with your knees slightly bent. If you lie on your back, put a pillow under your knees.  · Only take over-the-counter or prescription medicines as directed by your caregiver. Over-the-counter medicines to reduce pain and inflammation are often the most helpful. Your caregiver may prescribe muscle relaxant drugs. These medicines help dull your pain so you can more quickly return to your normal activities and healthy exercise.  · Put ice on the injured area.  ¨ Put ice in a plastic bag.  ¨ Place a towel between your skin and the bag.  ¨ Leave the ice on for 15-20 minutes, 03-04 times a day for the first 2 to 3 days. After that, ice and heat may be alternated to reduce pain and spasms.  · Ask your caregiver about trying back exercises and gentle massage. This may be of some benefit.  · Avoid feeling anxious or stressed. Stress increases muscle tension and can worsen back pain. It is important to recognize when you are anxious or stressed and learn ways to manage it. Exercise is a great option.  SEEK MEDICAL CARE IF:  · You have pain that is not relieved with rest or   medicine.  · You have pain that does not improve in 1 week.  · You have new symptoms.  · You are generally not feeling well.  SEEK IMMEDIATE MEDICAL CARE IF:   · You have pain that radiates from your back into your legs.  · You develop new bowel or bladder control problems.  · You have unusual weakness or numbness in your arms or legs.  · You develop nausea or vomiting.  · You develop abdominal pain.  · You feel faint.  Document Released: 02/25/2005 Document Revised: 08/27/2011 Document Reviewed: 07/16/2010  [employer]® Patient Information ©2015 [employer]. This information is not intended to replace advice given to you by your health care provider. Make sure you  discuss any questions you have with your health care provider.

## 2013-09-13 ENCOUNTER — Encounter: Payer: Self-pay | Admitting: Internal Medicine

## 2013-09-13 ENCOUNTER — Ambulatory Visit: Payer: No Typology Code available for payment source | Attending: Internal Medicine | Admitting: Internal Medicine

## 2013-09-13 VITALS — BP 135/91 | HR 76 | Temp 97.9°F | Resp 16

## 2013-09-13 DIAGNOSIS — E089 Diabetes mellitus due to underlying condition without complications: Secondary | ICD-10-CM | POA: Insufficient documentation

## 2013-09-13 DIAGNOSIS — M545 Low back pain, unspecified: Secondary | ICD-10-CM | POA: Insufficient documentation

## 2013-09-13 DIAGNOSIS — M5126 Other intervertebral disc displacement, lumbar region: Secondary | ICD-10-CM | POA: Insufficient documentation

## 2013-09-13 DIAGNOSIS — K429 Umbilical hernia without obstruction or gangrene: Secondary | ICD-10-CM

## 2013-09-13 DIAGNOSIS — E139 Other specified diabetes mellitus without complications: Secondary | ICD-10-CM

## 2013-09-13 DIAGNOSIS — M549 Dorsalgia, unspecified: Secondary | ICD-10-CM

## 2013-09-13 DIAGNOSIS — E119 Type 2 diabetes mellitus without complications: Secondary | ICD-10-CM | POA: Insufficient documentation

## 2013-09-13 DIAGNOSIS — K5900 Constipation, unspecified: Secondary | ICD-10-CM | POA: Insufficient documentation

## 2013-09-13 DIAGNOSIS — G8929 Other chronic pain: Secondary | ICD-10-CM | POA: Insufficient documentation

## 2013-09-13 LAB — GLUCOSE, POCT (MANUAL RESULT ENTRY): POC Glucose: 113 mg/dl — AB (ref 70–99)

## 2013-09-13 MED ORDER — MAGNESIUM HYDROXIDE 400 MG/5ML PO SUSP: 30.0000 mL | Freq: Every day | ORAL | Status: DC | PRN

## 2013-09-13 NOTE — Progress Notes (Signed)
MRN: [id number] Name: David Wyatt  Sex: male Age: 53 y.o. DOB: 05-22-1960  Allergies: Review of patient's allergies indicates no known allergies.  Chief Complaint  Patient presents with  . Follow-up    HPI: Patient is 53 y.o. male who has history of chronic low back pain comes today for followup recently went to the emergency room with worsening symptoms, EMR reviewed patient had x-ray done which reported to have DJD, previously patient has MRI done her her which reported disc herniation at L4/5, patient denies any incontinence reported to have history of constipation, patient was given pain medication and muscle relaxant prescription which he has not filled in yet, as per patient he went to [facility] in the past and was advised to get a surgery done, patient is requesting referral to see a neurosurgeon, he also has history of diabetes which is fairly well controlled denies any hypoglycemic symptoms, he also has umbilical hernia, as per patient sometimes it becomes hard but denies any pain fever chills nausea vomiting.  Past Medical History  Diagnosis Date  . Hypertension   . Cocaine abuse   . Type II diabetes mellitus   . Arthritis     "hands" (07/28/2012)  . Angioedema of lips 07/28/2012    left upper (07/29/2012)  . Neuropathy   . Chronic back pain     Past Surgical History  Procedure Laterality Date  . Inguinal hernia repair  04/01/2012    Procedure: HERNIA REPAIR INGUINAL ADULT;  Surgeon: Haywood Lasso, MD;  Location: [facility];  Service: General;  Laterality: Right;  . Hernia repair Right 04/01/2012  . Incision and drainage of wound      boil on back/notes 07/14/2008 (07/29/2012)  . I&d extremity Left 12/13/2012    Procedure: IRRIGATION AND DEBRIDEMENT LEFT THUMB;  Surgeon: Tennis Must, MD;  Location: [place];  Service: Orthopedics;  Laterality: Left;      Medication List       This list is accurate as of: 09/13/13 10:09 AM.  Always use your most recent med list.               cyclobenzaprine 5 MG tablet  Commonly known as:  FLEXERIL  Take 1 tablet (5 mg total) by mouth 3 (three) times daily as needed for muscle spasms.     gabapentin 600 MG tablet  Commonly known as:  NEURONTIN  Take 600 mg by mouth at bedtime.     gabapentin 100 MG capsule  Commonly known as:  NEURONTIN  Take 1 capsule (100 mg total) by mouth 3 (three) times daily.     glipiZIDE 10 MG 24 hr tablet  Commonly known as:  GLUCOTROL XL  Take 20 mg by mouth daily with breakfast. Per medical record     magnesium hydroxide 400 MG/5ML suspension  Commonly known as:  MILK OF MAGNESIA  Take 30 mLs by mouth daily as needed for mild constipation.     meloxicam 7.5 MG tablet  Commonly known as:  MOBIC  Take 2 tablets (15 mg total) by mouth daily.     metFORMIN 1000 MG tablet  Commonly known as:  GLUCOPHAGE  Take 1,000 mg by mouth 2 (two) times daily with a meal. Per medical record     methocarbamol 500 MG tablet  Commonly known as:  ROBAXIN  Take 2 tablets (1,000 mg total) by mouth 4 (four) times daily.     naproxen 250 MG tablet  Commonly known as:  NAPROSYN  Take 250 mg by mouth 2 (two) times daily with a meal.     naproxen 500 MG tablet  Commonly known as:  NAPROSYN  Take 1 tablet (500 mg total) by mouth 2 (two) times daily with a meal.     naproxen 500 MG tablet  Commonly known as:  NAPROSYN  Take 1 tablet (500 mg total) by mouth 2 (two) times daily.     naproxen 500 MG tablet  Commonly known as:  NAPROSYN  Take 1 tablet (500 mg total) by mouth 2 (two) times daily with a meal.     penicillin v potassium 500 MG tablet  Commonly known as:  VEETID  Take 1 tablet (500 mg total) by mouth 4 (four) times daily.     predniSONE 20 MG tablet  Commonly known as:  DELTASONE  Take 2 tablets (40 mg total) by mouth daily with breakfast.     traMADol 50 MG tablet  Commonly known as:  ULTRAM  Take 1 tablet (50 mg total) by mouth every 8 (eight) hours as needed.        Meds  ordered this encounter  Medications  . magnesium hydroxide (MILK OF MAGNESIA) 400 MG/5ML suspension    Sig: Take 30 mLs by mouth daily as needed for mild constipation.    Dispense:  360 mL    Refill:  0    Immunization History  Administered Date(s) Administered  . Pneumococcal Polysaccharide-23 04/02/2012  . Tdap 12/13/2012, 04/09/2013    Family History  Problem Relation Age of Onset  . Diabetes Father     History  Substance Use Topics  . Smoking status: Current Every Day Smoker -- 0.25 packs/day for 30 years    Types: Cigarettes  . Smokeless tobacco: Never Used  . Alcohol Use: No    Review of Systems   As noted in HPI  Filed Vitals:   09/13/13 0939  BP: 135/91  Pulse: 76  Temp: 97.9 F (36.6 C)  Resp: 16    Physical Exam  Physical Exam  Constitutional: No distress.  Eyes: EOM are normal. Pupils are equal, round, and reactive to light.  Cardiovascular: Normal rate and regular rhythm.   Pulmonary/Chest: Breath sounds normal. No respiratory distress. He has no wheezes. He has no rales.  Abdominal: Soft. There is no tenderness. There is no rebound.  Umbilical hernia non tender   Musculoskeletal:  Lower lumbar paraspinal tenderness     CBC    Component Value Date/Time   WBC 9.0 05/05/2013 2110   RBC 4.34 05/05/2013 2110   HGB 16.0 08/15/2013 1400   HCT 47.0 08/15/2013 1400   PLT 373 05/05/2013 2110   MCV 94.5 05/05/2013 2110   LYMPHSABS 2.0 05/05/2013 2110   MONOABS 0.6 05/05/2013 2110   EOSABS 0.3 05/05/2013 2110   BASOSABS 0.0 05/05/2013 2110    CMP     Component Value Date/Time   NA 137 08/15/2013 1400   K 4.2 08/15/2013 1400   CL 109 08/15/2013 1400   CO2 20 05/05/2013 2110   GLUCOSE 158* 08/15/2013 1400   BUN 20 08/15/2013 1400   CREATININE 1.40* 08/15/2013 1400   CALCIUM 9.4 05/05/2013 2110   PROT 8.1 05/05/2013 2110   ALBUMIN 3.5 05/05/2013 2110   AST 20 05/05/2013 2110   ALT 17 05/05/2013 2110   ALKPHOS 74 05/05/2013 2110   BILITOT 0.6 05/05/2013 2110    GFRNONAA 73* 05/05/2013 2110   GFRAA 84* 05/05/2013 2110    No results found  for this basename: chol, tri, ldl    No components found with this basename: hga1c    Lab Results  Component Value Date/Time   AST 20 05/05/2013  9:10 PM    Assessment and Plan  Diabetes mellitus due to underlying condition without complications - Plan:  Results for orders placed in visit on 09/13/13  GLUCOSE, POCT (MANUAL RESULT ENTRY)      Result Value Ref Range   POC Glucose 113 (*) 70 - 99 mg/dl    fairly well controlled, last A1c was 7.0%, continue with Glucotrol and metformin, will repeat A1c on the next visit.   Chronic back pain /Lumbar disc herniation- Plan:, Pain medication when necessary also advise patient to apply heating pad, Ambulatory referral to Neurosurgery  Umbilical hernia, recurrence not specified -  Plan: Ambulatory referral to General Surgery  Unspecified constipation - Plan:  trial of magnesium hydroxide (MILK OF MAGNESIA) 400 MG/5ML suspension, also advise patient to increase fiber in his diet     Return in about 3 months (around 12/14/2013) for diabetes.  Lorayne Marek, MD

## 2013-09-13 NOTE — Progress Notes (Signed)
Patient here for follow up from the ED Was seen for a pinched nerve in his back Needs referral to surgeon for his hernia as well

## 2013-09-14 ENCOUNTER — Ambulatory Visit: Payer: No Typology Code available for payment source

## 2013-09-16 ENCOUNTER — Encounter: Payer: No Typology Code available for payment source | Admitting: Physical Therapy

## 2013-09-16 NOTE — ED Provider Notes (Signed)
  This was a shared visit with a mid-level provided (NP or PA).  Throughout the patient's course I was available for consultation/collaboration.  I saw the relevant labs and studies - I agree with the interpretation.  On my exam the patient was in no distress. He has a long history of chronic pain. On exam patient was awake alert, distally neurovascularly intact. We had a lengthy conversation about the need for additional evaluation as an outpatient given the absence of acute new findings.      Carmin Muskrat, MD 09/16/13 [phone]

## 2013-09-17 ENCOUNTER — Telehealth: Payer: Self-pay | Admitting: *Deleted

## 2013-09-17 NOTE — Telephone Encounter (Signed)
Returning patients call. Patient phone went straight to voicemail. Unable to leave a message as mailbox is full.

## 2013-09-18 ENCOUNTER — Emergency Department (HOSPITAL_COMMUNITY)
Admission: EM | Admit: 2013-09-18 | Discharge: 2013-09-18 | Disposition: A | Discharge status: Home / Self Care | Payer: No Typology Code available for payment source | Attending: Emergency Medicine | Admitting: Emergency Medicine

## 2013-09-18 ENCOUNTER — Emergency Department (HOSPITAL_COMMUNITY): Payer: No Typology Code available for payment source

## 2013-09-18 ENCOUNTER — Encounter (HOSPITAL_COMMUNITY): Payer: Self-pay | Admitting: Emergency Medicine

## 2013-09-18 DIAGNOSIS — Z79899 Other long term (current) drug therapy: Secondary | ICD-10-CM | POA: Insufficient documentation

## 2013-09-18 DIAGNOSIS — M549 Dorsalgia, unspecified: Secondary | ICD-10-CM | POA: Insufficient documentation

## 2013-09-18 DIAGNOSIS — IMO0002 Reserved for concepts with insufficient information to code with codable children: Secondary | ICD-10-CM | POA: Insufficient documentation

## 2013-09-18 DIAGNOSIS — X58XXXA Exposure to other specified factors, initial encounter: Secondary | ICD-10-CM | POA: Insufficient documentation

## 2013-09-18 DIAGNOSIS — Y939 Activity, unspecified: Secondary | ICD-10-CM | POA: Insufficient documentation

## 2013-09-18 DIAGNOSIS — Z791 Long term (current) use of non-steroidal anti-inflammatories (NSAID): Secondary | ICD-10-CM | POA: Insufficient documentation

## 2013-09-18 DIAGNOSIS — G589 Mononeuropathy, unspecified: Secondary | ICD-10-CM | POA: Insufficient documentation

## 2013-09-18 DIAGNOSIS — M79609 Pain in unspecified limb: Secondary | ICD-10-CM | POA: Insufficient documentation

## 2013-09-18 DIAGNOSIS — G8929 Other chronic pain: Secondary | ICD-10-CM

## 2013-09-18 DIAGNOSIS — E119 Type 2 diabetes mellitus without complications: Secondary | ICD-10-CM | POA: Insufficient documentation

## 2013-09-18 DIAGNOSIS — M129 Arthropathy, unspecified: Secondary | ICD-10-CM | POA: Insufficient documentation

## 2013-09-18 DIAGNOSIS — Y929 Unspecified place or not applicable: Secondary | ICD-10-CM | POA: Insufficient documentation

## 2013-09-18 DIAGNOSIS — I10 Essential (primary) hypertension: Secondary | ICD-10-CM | POA: Insufficient documentation

## 2013-09-18 DIAGNOSIS — S42402A Unspecified fracture of lower end of left humerus, initial encounter for closed fracture: Secondary | ICD-10-CM

## 2013-09-18 DIAGNOSIS — F17200 Nicotine dependence, unspecified, uncomplicated: Secondary | ICD-10-CM | POA: Insufficient documentation

## 2013-09-18 LAB — CBC
HCT: 38.8 % — ABNORMAL LOW (ref 39.0–52.0)
Hemoglobin: 12.8 g/dL — ABNORMAL LOW (ref 13.0–17.0)
MCH: 31.3 pg (ref 26.0–34.0)
MCHC: 33 g/dL (ref 30.0–36.0)
MCV: 94.9 fL (ref 78.0–100.0)
Platelets: 380 10*3/uL (ref 150–400)
RBC: 4.09 MIL/uL — ABNORMAL LOW (ref 4.22–5.81)
RDW: 12.9 % (ref 11.5–15.5)
WBC: 5.3 10*3/uL (ref 4.0–10.5)

## 2013-09-18 LAB — BASIC METABOLIC PANEL
Anion gap: 15 (ref 5–15)
BUN: 14 mg/dL (ref 6–23)
CHLORIDE: 105 meq/L (ref 96–112)
CO2: 25 mEq/L (ref 19–32)
Calcium: 9 mg/dL (ref 8.4–10.5)
Creatinine, Ser: 1 mg/dL (ref 0.50–1.35)
GFR calc non Af Amer: 84 mL/min — ABNORMAL LOW (ref >90)
Glucose, Bld: 158 mg/dL — ABNORMAL HIGH (ref 70–99)
Potassium: 3.6 mEq/L — ABNORMAL LOW (ref 3.7–5.3)
Sodium: 145 mEq/L (ref 137–147)

## 2013-09-18 LAB — CBG MONITORING, ED: Glucose-Capillary: 167 mg/dL — ABNORMAL HIGH (ref 70–99)

## 2013-09-18 MED ORDER — TRAMADOL HCL 50 MG PO TABS: 50.0000 mg | ORAL_TABLET | Freq: Four times a day (QID) | ORAL | Status: DC | PRN

## 2013-09-18 MED ORDER — OXYCODONE-ACETAMINOPHEN 5-325 MG PO TABS
2.0000 | ORAL_TABLET | Freq: Once | ORAL | Status: AC
  Administered 2013-09-18: 2 via ORAL
  Filled 2013-09-18: qty 2

## 2013-09-18 NOTE — ED Notes (Signed)
Ortho paged and called back

## 2013-09-18 NOTE — ED Provider Notes (Signed)
CSN: [id number]     Arrival date & time 09/18/13  3710 History   First MD Initiated Contact with Patient 09/18/13 1021     Chief Complaint  Patient presents with  . Back Pain  . Leg Pain  . Arm Swelling    LT     (Consider location/radiation/quality/duration/timing/severity/associated sxs/prior Treatment) HPI Comments: Pt states that has chronic back pain that is on going. Pt states that it makes is left leg give out when he walks. Denies numbness, weakness or incontinence. States that he is also having pain in the left elbow. No know injury. States that it hurts to rest the elbow down. Denies drainage. Pt states that he is does use cocaine, but has no history of injecting medications  The history is provided by the patient. No language interpreter was used.    Past Medical History  Diagnosis Date  . Hypertension   . Cocaine abuse   . Type II diabetes mellitus   . Arthritis     "hands" (07/28/2012)  . Angioedema of lips 07/28/2012    left upper (07/29/2012)  . Neuropathy   . Chronic back pain    Past Surgical History  Procedure Laterality Date  . Inguinal hernia repair  04/01/2012    Procedure: HERNIA REPAIR INGUINAL ADULT;  Surgeon: Haywood Lasso, MD;  Location: [facility];  Service: General;  Laterality: Right;  . Hernia repair Right 04/01/2012  . Incision and drainage of wound      boil on back/notes 07/14/2008 (07/29/2012)  . I&d extremity Left 12/13/2012    Procedure: IRRIGATION AND DEBRIDEMENT LEFT THUMB;  Surgeon: Tennis Must, MD;  Location: [facility];  Service: Orthopedics;  Laterality: Left;   Family History  Problem Relation Age of Onset  . Diabetes Father    History  Substance Use Topics  . Smoking status: Current Every Day Smoker -- 0.25 packs/day for 30 years    Types: Cigarettes  . Smokeless tobacco: Never Used  . Alcohol Use: No    Review of Systems  Constitutional: Negative.   Respiratory: Negative.       Allergies  Review of patient's allergies  indicates no known allergies.  Home Medications   Prior to Admission medications   Medication Sig Start Date End Date Taking? Authorizing Provider  cyclobenzaprine (FLEXERIL) 5 MG tablet Take 1 tablet (5 mg total) by mouth 3 (three) times daily as needed for muscle spasms. 09/12/13  Yes [name], PA-C  esomeprazole (NEXIUM) 40 MG capsule Take 40 mg by mouth daily as needed (for acid reflux).   Yes Historical Provider, MD  gabapentin (NEURONTIN) 100 MG capsule Take 300 mg by mouth 3 (three) times daily.   Yes Historical Provider, MD  glipiZIDE (GLUCOTROL XL) 10 MG 24 hr tablet Take 20 mg by mouth daily with breakfast. Per medical record 07/12/13  Yes Debbe Odea, MD  lisinopril-hydrochlorothiazide (PRINZIDE,ZESTORETIC) 20-25 MG per tablet Take 1 tablet by mouth daily.   Yes Historical Provider, MD  metFORMIN (GLUCOPHAGE) 1000 MG tablet Take 1,000 mg by mouth 2 (two) times daily with a meal. Per medical record 07/12/13  Yes Debbe Odea, MD  naproxen (NAPROSYN) 500 MG tablet Take 1 tablet (500 mg total) by mouth 2 (two) times daily with a meal. 09/12/13  Yes [name], PA-C   BP 125/82  Pulse 66  Temp(Src) 98 F (36.7 C) (Oral)  Resp 18  Ht 5\' 10"  (1.778 m)  Wt 166 lb (75.297 kg)  BMI 23.82  kg/m2  SpO2 95% Physical Exam  Nursing note and vitals reviewed. Constitutional: He is oriented to person, place, and time. He appears well-developed and well-nourished.  Cardiovascular: Normal rate and regular rhythm.   Pulmonary/Chest: Effort normal and breath sounds normal.  Musculoskeletal: Normal range of motion.  Lumbar spinal and paraspinal tenderness. Moving all extremities without any problem. Equal strength noted. Pt has swelling noted to the posterior left elbow with warmth noted to the area. Pt has full rom  Neurological: He is alert and oriented to person, place, and time. He exhibits normal muscle tone. Coordination normal.    ED Course  Procedures (including critical care  time) Labs Review Labs Reviewed  CBC - Abnormal; Notable for the following:    RBC 4.09 (*)    Hemoglobin 12.8 (*)    HCT 38.8 (*)    All other components within normal limits  CBG MONITORING, ED - Abnormal; Notable for the following:    Glucose-Capillary 167 (*)    All other components within normal limits  BASIC METABOLIC PANEL    Imaging Review Dg Elbow Complete Left  09/18/2013   CLINICAL DATA:  Left elbow pain.  No known injury  EXAM: LEFT ELBOW - COMPLETE 3+ VIEW  COMPARISON:  None.  FINDINGS: There is a osseous irregularity along the medial aspect of the proximal ulna at the articular surface with the medial condyles. No joint effusion.  IMPRESSION: Age indeterminate fracture along the medial aspect of the most ulna radius at the articular surface.   Electronically Signed   By: Suzy Bouchard M.D.   On: 09/18/2013 12:23     EKG Interpretation None      MDM   Final diagnoses:  Chronic back pain  Elbow fracture, left, closed, initial encounter    Pt to follow up with DR. Fredna Dow. Doubt septic joint. Pt has full rom of arm and all extremities. No concern for cauda equina:will give Angelique Holm, NP 09/18/13 1311

## 2013-09-18 NOTE — Progress Notes (Signed)
Orthopedic Tech Progress Note Patient Details:  David Wyatt 1960/08/12 [id number]  Ortho Devices Type of Ortho Device: Long arm splint;Arm sling Ortho Device/Splint Interventions: Application   Cammer, Theodoro Parma 09/18/2013, 4:03 PM

## 2013-09-18 NOTE — ED Provider Notes (Signed)
MSE was initiated and I personally evaluated the patient and placed orders (if any) at  10:51 AM on September 18, 2013.  David Wyatt is a 53 year old male with past medical history of cocaine abuse, hypertension, neuropathy, type 2 diabetes who is not medical compliance presenting to the ED with multiple complaints. Patient reporting back pain-reported that he has a sharp shooting pain radiating down his whole back when walking-reported that he is unable to walk. This provider reviewed patient's chart-patient is been seen is times in ED setting regarding chronic back pain. Patient presents with a new finding, reported is been having swelling to his elbow without injury for the past 2 days. Stated that when he woke up one morning he noticed that his elbow was swollen and hot to the touch. Stated that he has intense pain with motion. Denied fall, injury, numbness, tingling, loss of sensation.  Patient alert and oriented. Swelling noted to the left elbow, with mild erythema noted. Warmth upon palpation. No track marks identified to the antecubital fossa is bilaterally. Pain with range of motion to the left elbow. Full range of motion to the hand and digits of the left hand.  Patient does not meet fast track criteria. Patient presenting to the ED with multiple complaints, patient noncompliant with diabetes. Warmth upon palpation to the elbow and elbow swelling without pain is a septic joint until proven otherwise. Labs and imaging have been ordered. Patient moved further assessment.  The patient appears stable so that the remainder of the MSE may be completed by another provider.  Jamse Mead, PA-C 09/18/13 1831

## 2013-09-18 NOTE — ED Notes (Signed)
Pt reports He was seen 3 days ago for same problem. Pt reports pain in Legs. And back are the same. Pt also reports swelling to Lt arm.

## 2013-09-18 NOTE — ED Notes (Signed)
Pt placed back on monitor upon return to room from radiology. Pt monitored by 5 lead, blood pressure, and pulse ox.

## 2013-09-18 NOTE — Discharge Instructions (Signed)
Chronic Back Pain  When back pain lasts longer than 3 months, it is called chronic back pain.People with chronic back pain often go through certain periods that are more intense (flare-ups).  CAUSES Chronic back pain can be caused by wear and tear (degeneration) on different structures in your back. These structures include:  The bones of your spine (vertebrae) and the joints surrounding your spinal cord and nerve roots (facets).  The strong, fibrous tissues that connect your vertebrae (ligaments). Degeneration of these structures may result in pressure on your nerves. This can lead to constant pain. HOME CARE INSTRUCTIONS  Avoid bending, heavy lifting, prolonged sitting, and activities which make the problem worse.  Take brief periods of rest throughout the day to reduce your pain. Lying down or standing usually is better than sitting while you are resting.  Take over-the-counter or prescription medicines only as directed by your caregiver. SEEK IMMEDIATE MEDICAL CARE IF:   You have weakness or numbness in one of your legs or feet.  You have trouble controlling your bladder or bowels.  You have nausea, vomiting, abdominal pain, shortness of breath, or fainting. Document Released: 04/04/2004 Document Revised: 05/20/2011 Document Reviewed: 02/09/2011 [facility] Patient Information 2015 [place], [place]. This information is not intended to replace advice given to you by your health care provider. Make sure you discuss any questions you have with your health care provider.  Elbow Fracture, Simple A fracture is a break in one of the bones.When fractures are not displaced or separated, they may be treated with only a sling or splint. The sling or splint may only be required for two to three weeks. In these cases, often the elbow is put through early range of motion exercises to prevent the elbow from getting stiff. DIAGNOSIS  The diagnosis (learning what is wrong) of a fractured elbow is made  by x-ray. These may be required before and after the elbow is put into a splint or cast. X-rays are taken after to make sure the bone pieces have not moved. HOME CARE INSTRUCTIONS   Only take over-the-counter or prescription medicines for pain, discomfort, or fever as directed by your caregiver.  If you have a splint held on with an elastic wrap and your hand or fingers become numb or cold and blue, loosen the wrap and reapply more loosely. See your caregiver if there is no relief.  You may use ice for twenty minutes, four times per day, for the first two to three days.  Use your elbow as directed.  See your caregiver as directed. It is very important to keep all follow-up referrals and appointments in order to avoid any long-term problems with your elbow including chronic pain or stiffness. SEEK IMMEDIATE MEDICAL CARE IF:   There is swelling or increasing pain in elbow.  You begin to lose feeling or experience numbness or tingling in your hand or fingers.  You develop swelling of the hand and fingers.  You get a cold or blue hand or fingers on affected side. MAKE SURE YOU:   Understand these instructions.  Will watch your condition.  Will get help right away if you are not doing well or get worse. Document Released: 02/19/2001 Document Revised: 05/20/2011 Document Reviewed: 01/10/2009 [facility] Patient Information 2015 [place], [place]. This information is not intended to replace advice given to you by your health care provider. Make sure you discuss any questions you have with your health care provider.

## 2013-09-18 NOTE — ED Provider Notes (Signed)
Medical screening examination/treatment/procedure(s) were conducted as a shared visit with non-physician practitioner(s) and myself.  I personally evaluated the patient during the encounter. Patient with history of chronic back pain. He presents today with increasing pain in his back, left elbow. He denies any specific injury or trauma. Denies any bowel or bladder complaints.  On exam, vitals are stable the patient is afebrile. Head is atraumatic, normocephalic. Neck is supple. The left elbow appears grossly normal. There is slight warmth, however no palpable effusion. He has full range of motion without any significant discomfort. The ulnar and radial pulses are easily palpable.  There appears to be no emergent cause of his x-rays all look okay. He does have a small avulsion fracture at the elbow which I suspect is old. He will be discharged home with when necessary followup.   EKG Interpretation None       Veryl Speak, MD 09/18/13 1338

## 2013-09-18 NOTE — ED Notes (Signed)
Marissa, PA aware of CBG results

## 2013-09-19 NOTE — ED Provider Notes (Signed)
Medical screening examination/treatment/procedure(s) were performed by non-physician practitioner and as supervising physician I was immediately available for consultation/collaboration.   EKG Interpretation None        Osvaldo Shipper, MD 09/19/13 1524

## 2013-09-21 ENCOUNTER — Encounter: Payer: No Typology Code available for payment source | Admitting: Rehabilitation

## 2013-09-22 ENCOUNTER — Emergency Department (HOSPITAL_COMMUNITY)
Admission: EM | Admit: 2013-09-22 | Discharge: 2013-09-22 | Disposition: A | Discharge status: Home / Self Care | Payer: Self-pay | Attending: Emergency Medicine | Admitting: Emergency Medicine

## 2013-09-22 ENCOUNTER — Emergency Department (HOSPITAL_COMMUNITY): Payer: No Typology Code available for payment source

## 2013-09-22 ENCOUNTER — Encounter (HOSPITAL_COMMUNITY): Payer: Self-pay | Admitting: Emergency Medicine

## 2013-09-22 DIAGNOSIS — I10 Essential (primary) hypertension: Secondary | ICD-10-CM | POA: Insufficient documentation

## 2013-09-22 DIAGNOSIS — Z79899 Other long term (current) drug therapy: Secondary | ICD-10-CM | POA: Insufficient documentation

## 2013-09-22 DIAGNOSIS — G8929 Other chronic pain: Secondary | ICD-10-CM | POA: Insufficient documentation

## 2013-09-22 DIAGNOSIS — F17200 Nicotine dependence, unspecified, uncomplicated: Secondary | ICD-10-CM | POA: Insufficient documentation

## 2013-09-22 DIAGNOSIS — G589 Mononeuropathy, unspecified: Secondary | ICD-10-CM | POA: Insufficient documentation

## 2013-09-22 DIAGNOSIS — M545 Low back pain: Secondary | ICD-10-CM

## 2013-09-22 DIAGNOSIS — M129 Arthropathy, unspecified: Secondary | ICD-10-CM | POA: Insufficient documentation

## 2013-09-22 DIAGNOSIS — Z791 Long term (current) use of non-steroidal anti-inflammatories (NSAID): Secondary | ICD-10-CM | POA: Insufficient documentation

## 2013-09-22 DIAGNOSIS — Z5900 Homelessness unspecified: Secondary | ICD-10-CM | POA: Insufficient documentation

## 2013-09-22 DIAGNOSIS — M5430 Sciatica, unspecified side: Secondary | ICD-10-CM | POA: Insufficient documentation

## 2013-09-22 DIAGNOSIS — E119 Type 2 diabetes mellitus without complications: Secondary | ICD-10-CM | POA: Insufficient documentation

## 2013-09-22 LAB — CBG MONITORING, ED: Glucose-Capillary: 98 mg/dL (ref 70–99)

## 2013-09-22 MED ORDER — ONDANSETRON 4 MG PO TBDP
8.0000 mg | ORAL_TABLET | Freq: Once | ORAL | Status: AC
  Administered 2013-09-22: 8 mg via ORAL
  Filled 2013-09-22: qty 2

## 2013-09-22 MED ORDER — HYDROCODONE-ACETAMINOPHEN 5-325 MG PO TABS
2.0000 | ORAL_TABLET | Freq: Once | ORAL | Status: AC
  Administered 2013-09-22: 2 via ORAL
  Filled 2013-09-22: qty 2

## 2013-09-22 MED ORDER — HYDROCODONE-ACETAMINOPHEN 5-325 MG PO TABS: 1.0000 | ORAL_TABLET | ORAL | Status: DC | PRN

## 2013-09-22 NOTE — ED Provider Notes (Signed)
CSN: [id number]     Arrival date & time 09/22/13  1215 History  This chart was scribed for non-physician practitioner Cleatrice Burke, PA-C, working with Mirna Mires, MD by Ludger Nutting, ED Scribe. This patient was seen in room TR07C/TR07C and the patient's care was started at 1:56 PM.    Chief Complaint  Patient presents with  . Back Pain   The history is provided by the patient. No language interpreter was used.    HPI Comments: David Wyatt is a 53 y.o. male with past medical history of HTN, DM, chronic back pain who presents to the Emergency Department complaining of chronic, constant, gradually worsened lower back pain with radiation to BLE that has been ongoing for several years. He denies recent falls, injury, or trauma. He describes the pain as sharp and is aggravated with ambulation. He has had prior MRI. Patient states he is homeless and is unable to follow up with PCP or specialist. He denies dysuria, urinary urgency, urinary frequency, hematuria, bowel or bladder incontinence. He denies current illicit drug use but states he was using crack cocaine about 1 month ago.   Past Medical History  Diagnosis Date  . Hypertension   . Cocaine abuse   . Type II diabetes mellitus   . Arthritis     "hands" (07/28/2012)  . Angioedema of lips 07/28/2012    left upper (07/29/2012)  . Neuropathy   . Chronic back pain    Past Surgical History  Procedure Laterality Date  . Inguinal hernia repair  04/01/2012    Procedure: HERNIA REPAIR INGUINAL ADULT;  Surgeon: Haywood Lasso, MD;  Location: [facility];  Service: General;  Laterality: Right;  . Hernia repair Right 04/01/2012  . Incision and drainage of wound      boil on back/notes 07/14/2008 (07/29/2012)  . I&d extremity Left 12/13/2012    Procedure: IRRIGATION AND DEBRIDEMENT LEFT THUMB;  Surgeon: Tennis Must, MD;  Location: [facility];  Service: Orthopedics;  Laterality: Left;   Family History  Problem Relation Age of Onset  . Diabetes Father     History  Substance Use Topics  . Smoking status: Current Every Day Smoker -- 0.25 packs/day for 30 years    Types: Cigarettes  . Smokeless tobacco: Never Used  . Alcohol Use: No    Review of Systems  Musculoskeletal: Positive for back pain and myalgias.  Neurological: Negative for weakness and numbness.  All other systems reviewed and are negative.     Allergies  Review of patient's allergies indicates no known allergies.  Home Medications   Prior to Admission medications   Medication Sig Start Date End Date Taking? Authorizing Provider  cyclobenzaprine (FLEXERIL) 5 MG tablet Take 1 tablet (5 mg total) by mouth 3 (three) times daily as needed for muscle spasms. 09/12/13   [name], PA-C  esomeprazole (NEXIUM) 40 MG capsule Take 40 mg by mouth daily as needed (for acid reflux).    Historical Provider, MD  gabapentin (NEURONTIN) 100 MG capsule Take 300 mg by mouth 3 (three) times daily.    Historical Provider, MD  glipiZIDE (GLUCOTROL XL) 10 MG 24 hr tablet Take 20 mg by mouth daily with breakfast. Per medical record 07/12/13   Debbe Odea, MD  lisinopril-hydrochlorothiazide (PRINZIDE,ZESTORETIC) 20-25 MG per tablet Take 1 tablet by mouth daily.    Historical Provider, MD  metFORMIN (GLUCOPHAGE) 1000 MG tablet Take 1,000 mg by mouth 2 (two) times daily with a meal. Per medical record 07/12/13  Debbe Odea, MD  naproxen (NAPROSYN) 500 MG tablet Take 1 tablet (500 mg total) by mouth 2 (two) times daily with a meal. 09/12/13   [name], PA-C  traMADol (ULTRAM) 50 MG tablet Take 1 tablet (50 mg total) by mouth every 6 (six) hours as needed. 09/18/13   Glendell Docker, NP   BP 131/78  Pulse 78  Temp(Src) 97.9 F (36.6 C) (Oral)  Resp 16  Ht 5\' 8"  (1.727 m)  Wt 170 lb (77.111 kg)  BMI 25.85 kg/m2  SpO2 100% Physical Exam  Nursing note and vitals reviewed. Constitutional: He is oriented to person, place, and time. He appears well-developed and well-nourished. No  distress.  HENT:  Head: Normocephalic and atraumatic.  Right Ear: External ear normal.  Left Ear: External ear normal.  Nose: Nose normal.  Eyes: Conjunctivae are normal.  Neck: Normal range of motion. No tracheal deviation present.  Cardiovascular: Normal rate, regular rhythm and normal heart sounds.   Pulses:      Posterior tibial pulses are 2+ on the right side, and 2+ on the left side.  Pulmonary/Chest: Effort normal and breath sounds normal. No stridor.  Abdominal: Soft. He exhibits no distension. There is no tenderness.  Musculoskeletal: Normal range of motion. He exhibits tenderness.  Tenderness to palpation over lumbar spine. SLR negative bilaterally. Log roll negative bilaterally.   Neurological: He is alert and oriented to person, place, and time.  Skin: Skin is warm and dry. He is not diaphoretic.  Psychiatric: He has a normal mood and affect. His behavior is normal.    ED Course  Procedures (including critical care time)  DIAGNOSTIC STUDIES: Oxygen Saturation is 100% on RA, normal by my interpretation.    COORDINATION OF CARE: 2:06 PM Will order XRAY of lumbar spine. Discussed treatment plan with pt at bedside and pt agreed to plan.   Labs Review Labs Reviewed - No data to display  Imaging Review Dg Lumbar Spine Complete  09/22/2013   CLINICAL DATA:  Chronic back pain  EXAM: LUMBAR SPINE - COMPLETE 4+ VIEW  COMPARISON:  Lumbar spine series of September 12, 2013  FINDINGS: The lumbar vertebral bodies are preserved in height. There is mild disc space narrowing at L5-S1 which is stable. There is mild facet joint hypertrophy at L4-5 and at L5-S1. There is no spondylolisthesis. The observed portions of the sacrum are unremarkable.  IMPRESSION: There is mild stable degenerative change of the lower lumbar spine   Electronically Signed   By: David  Martinique   On: 09/22/2013 14:53     EKG Interpretation None      MDM   Final diagnoses:  Bilateral low back pain, with sciatica  presence unspecified   Patient with back pain.  No neurological deficits and normal neuro exam.  Patient can walk but states is painful.  No loss of bowel or bladder control.  No concern for cauda equina.  No fever, night sweats, weight loss, h/o cancer, IVDU.  RICE protocol and pain medicine indicated and discussed with patient.    I personally performed the services described in this documentation, which was scribed in my presence. The recorded information has been reviewed and is accurate.    Elwyn Lade, PA-C 09/23/13 [phone]

## 2013-09-22 NOTE — ED Notes (Signed)
Called case management per patient request to talk with one.

## 2013-09-22 NOTE — ED Notes (Signed)
Patient states chronic back pain.   Patient states no insurance and cant follow up with internal medicine or orthopedic surgery.   Patient states he went to [facility] and they told him he needed surgery a few months ago, but states he can't do it.

## 2013-09-22 NOTE — Discharge Planning (Signed)
[facility] Liaison  Patient is a current orange card holder at the [facility], but wants to change his orange card to the [facility]. Liaison informed patient of his orange card options with his current card. [facility] case manage will follow up with the patient on the phone number pt provided. Resource guide and my contact information was provided for any future questions or concerns. No other Community Liaison needs identified at this time.

## 2013-09-22 NOTE — Discharge Instructions (Signed)
Back Pain, Adult °Low back pain is very common. About 1 in 5 people have back pain. The cause of low back pain is rarely dangerous. The pain often gets better over time. About half of people with a sudden onset of back pain feel better in just 2 weeks. About 8 in 10 people feel better by 6 weeks.  °CAUSES °Some common causes of back pain include: °· Strain of the muscles or ligaments supporting the spine. °· Wear and tear (degeneration) of the spinal discs. °· Arthritis. °· Direct injury to the back. °DIAGNOSIS °Most of the time, the direct cause of low back pain is not known. However, back pain can be treated effectively even when the exact cause of the pain is unknown. Answering your caregiver's questions about your overall health and symptoms is one of the most accurate ways to make sure the cause of your pain is not dangerous. If your caregiver needs more information, he or she may order lab work or imaging tests (X-rays or MRIs). However, even if imaging tests show changes in your back, this usually does not require surgery. °HOME CARE INSTRUCTIONS °For many people, back pain returns. Since low back pain is rarely dangerous, it is often a condition that people can learn to manage on their own.  °· Remain active. It is stressful on the back to sit or stand in one place. Do not sit, drive, or stand in one place for more than 30 minutes at a time. Take short walks on level surfaces as soon as pain allows. Try to increase the length of time you walk each day. °· Do not stay in bed. Resting more than 1 or 2 days can delay your recovery. °· Do not avoid exercise or work. Your body is made to move. It is not dangerous to be active, even though your back may hurt. Your back will likely heal faster if you return to being active before your pain is gone. °· Pay attention to your body when you  bend and lift. Many people have less discomfort when lifting if they bend their knees, keep the load close to their bodies, and  avoid twisting. Often, the most comfortable positions are those that put less stress on your recovering back. °· Find a comfortable position to sleep. Use a firm mattress and lie on your side with your knees slightly bent. If you lie on your back, put a pillow under your knees. °· Only take over-the-counter or prescription medicines as directed by your caregiver. Over-the-counter medicines to reduce pain and inflammation are often the most helpful. Your caregiver may prescribe muscle relaxant drugs. These medicines help dull your pain so you can more quickly return to your normal activities and healthy exercise. °· Put ice on the injured area. °¨ Put ice in a plastic bag. °¨ Place a towel between your skin and the bag. °¨ Leave the ice on for 15-20 minutes, 03-04 times a day for the first 2 to 3 days. After that, ice and heat may be alternated to reduce pain and spasms. °· Ask your caregiver about trying back exercises and gentle massage. This may be of some benefit. °· Avoid feeling anxious or stressed. Stress increases muscle tension and can worsen back pain. It is important to recognize when you are anxious or stressed and learn ways to manage it. Exercise is a great option. °SEEK MEDICAL CARE IF: °· You have pain that is not relieved with rest or medicine. °· You have pain that does not improve in 1 week. °· You have new symptoms. °· You are generally not feeling well. °SEEK   IMMEDIATE MEDICAL CARE IF:   You have pain that radiates from your back into your legs.  You develop new bowel or bladder control problems.  You have unusual weakness or numbness in your arms or legs.  You develop nausea or vomiting.  You develop abdominal pain.  You feel faint. Document Released: 02/25/2005 Document Revised: 08/27/2011 Document Reviewed: 07/16/2010 [facility] Patient Information 2015 [place], [place]. This information is not intended to replace advice given to you by your health care provider. Make sure you  discuss any questions you have with your health care provider.   Emergency Department Resource Guide 1) Find a Doctor and Pay Out of Pocket Although you won't have to find out who is covered by your insurance plan, it is a good idea to ask around and get recommendations. You will then need to call the office and see if the doctor you have chosen will accept you as a new patient and what types of options they offer for patients who are self-pay. Some doctors offer discounts or will set up payment plans for their patients who do not have insurance, but you will need to ask so you aren't surprised when you get to your appointment.  2) Contact Your Local Health Department Not all health departments have doctors that can see patients for sick visits, but many do, so it is worth a call to see if yours does. If you don't know where your local health department is, you can check in your phone book. The CDC also has a tool to help you locate your state's health department, and many state websites also have listings of all of their local health departments.  3) Find a [facility] If your illness is not likely to be very severe or complicated, you may want to try a walk in clinic. These are popping up all over the country in pharmacies, drugstores, and shopping centers. They're usually staffed by nurse practitioners or physician assistants that have been trained to treat common illnesses and complaints. They're usually fairly quick and inexpensive. However, if you have serious medical issues or chronic medical problems, these are probably not your best option.  No Primary Care Doctor: - Call Health Connect at  [phone] - they can help you locate a primary care doctor that  accepts your insurance, provides certain services, etc. - Physician Referral Service- [phone]  Chronic Pain Problems: Organization         Address  Phone   Notes  [facility]  [phone] Patients need to  be referred by their primary care doctor.   Medication Assistance: Organization         Address  Phone   Notes  [facility] Medication [facility] [address] [place], [place] [address] [phone] --Must be a resident of [facility] -- Must have NO insurance coverage whatsoever (no Medicaid/ Medicare, etc.) -- The pt. MUST have a primary care doctor that directs their care regularly and follows them in the community   MedAssist  [phone]   [employer]  [phone]    Agencies that provide inexpensive medical care: Organization         Address  Phone   Notes  [place]  [phone]   [facility]    [phone]   [facility] [place], [place] [address] [phone]   [place] [address],  [place] [phone]   Planned Parenthood    [phone]   [facility]    [phone]   Community Health and [place] [address], [place] Phone:  [phone], Fax:  [phone] Hours of Operation:  9 am - 6 pm, M-F.  Also accepts Medicaid/Medicare and self-pay.  [facility] for [place] [place], [address], [place] Phone: [phone], Fax: [phone]. Hours of Operation:  8:30 am - 5:30 pm, M-F.  Also accepts Medicaid and self-pay.  [facility] [place] [address], [place] Phone: [phone]   [place], [place], [place] [phone], Ext. [phone] Mondays & Thursdays: 7-9 AM.  First 15 patients are seen on a first come, first serve basis.    [place] Providers:  Organization         Address  Phone   Notes  [facility] [address], [place] [phone] Also accepts self-pay patients.  [facility] [address], [place]  [phone]   [place], [address], [place] [phone]   [facility] Family Medicine [address] [place] [phone]   Lucianne Lei [address], [place]   [phone] Only accepts [place] Access [place] patients after they have their name applied to their card.   Self-Pay (no insurance) in [facility]:  Organization         Address  Phone   Notes  Sickle Cell Patients, [facility] Internal Medicine [place] [phone]   [facility] [place] [phone]   [facility] [place]  [place], [place], [place] [phone]   [facility]/Dr. Osei-Bonsu  [address], [place] or [address], [place] [phone] Phone number for both [place] and [place] locations is the same.  [facility] and [facility] [address], [place] [phone]   [facility] [address] [place] or [address] [phone]   [facility] [address] [place] [phone], phone; [phone], fax Sees patients 1st and 3rd Saturday of every month.  Must not qualify for public or private insurance (i.e. Medicaid, Medicare, [place] Health Choice, Veterans' Benefits)  Household income should be no more than 200% of the poverty level The clinic cannot treat you if you are pregnant or think you are pregnant  Sexually transmitted diseases are not treated at the clinic.    Dental Care: Organization         Address  Phone  Notes  [facility] Department of [facility] [place] [phone] Accepts children up to age 110 who are enrolled in [place] or [place]; pregnant women with a Medicaid card; and children who have applied for Medicaid or [place] Health Choice, but were declined, whose parents can  pay a reduced fee at time of service.  [facility] Department of [facility]  [address], [place] [phone] Accepts children up to age 35 who are enrolled in [place] or [place]; pregnant women with a Medicaid card; and children who have applied for Medicaid or [place]  Health Choice, but were declined, whose parents can pay a reduced fee at time of service.  [place] Adult Dental Access PROGRAM  [place] [phone] Patients are seen by appointment only. Walk-ins are not accepted. [place] will see patients 54 years of age and older. Monday - Tuesday (8am-5pm) Most Wednesdays (8:30-5pm) $30 per visit, cash only  [facility] Adult Dental Access PROGRAM  [address], [facility] [phone] Patients are seen by appointment only. Walk-ins are not accepted. [place] will see patients 74 years of age and older. One Wednesday Evening (Monthly: Volunteer Based).  $30 per visit, cash only  [place]  [phone] for adults; Children under age 11, call Graduate Pediatric Dentistry at [phone]. Children aged 13-14, please call [phone] to request a pediatric application.  Dental services are provided in all areas of dental care including fillings, crowns and bridges, complete and partial dentures, implants, gum treatment, root canals, and extractions. Preventive care is also provided. Treatment is provided to both adults and children. Patients are selected via a lottery and there is often a waiting list.   [place] [address], [place]  [phone] [email or web address]   [facility] [address] [place], [place] [phone], Ext. [phone] Second and Fourth Thursday of each month, opens at 6:30 AM; Clinic ends at 9 AM.  Patients are seen on a first-come first-served basis, and a limited number are seen during each clinic.   [facility]  [address] Hillard Danker [place], [place] [phone]   Eligibility Requirements You must have lived in [place], [place], or [place] counties for at least the last three months.   You cannot be eligible for state or federal sponsored [employer], including [employer], [place], or [employer].   You generally cannot be eligible for healthcare insurance through your employer.    How to apply: Eligibility screenings are held every Tuesday and Wednesday afternoon from 1:00 pm until 4:00 pm. You do not need an appointment for the interview!  [facility] [address], [place], [place]   [place]  [place] Department  [place]  [phone]    Behavioral Health Resources in the Community: Intensive Outpatient Programs Organization         Address  Phone  Notes  [facility] [place] [place]. [address] [place], [place] [phone]   [facility] Outpatient [address] [place], [place]   ADS: Alcohol & Drug Svcs [address], [place], [place]   [place] [address]  [place], [place] or [phone]   Substance Abuse Resources Organization         Address  Phone  Notes  Alcohol and Drug Services  [phone]   [place]  [phone]   [facility] [place]   Chinita Pester  [phone]   Residential & Outpatient Substance Abuse Program  [phone]   Psychological Services Organization         Address  Phone  Notes  [facility] [place]  [place]  [phone]   [place] [address] [place] or [phone]    Mobile Crisis Teams Organization         Address  Phone  Notes  [facility], Mobile Crisis Care Unit  [phone]    Assertive Psychotherapeutic Services  [address] [place], [place]   [facility] [address], [place] [phone]    Self-Help/Support Groups Organization         Address  Phone             Notes  [place]. of [place] - variety of support groups  [place] Call for more information  Narcotics Anonymous (NA), [facility] [address], [employer] Muse  2 meetings at this location   Special educational needs teacher         Address  Phone  Notes  ASAP Residential Treatment [place],    [place]  [phone]   [facility]  [address] [place] [address], [place], [place]   [place], [place] [phone] Admissions: 8am-3pm M-F  Incentives Substance [place] [address]    [place], [place] [phone]   [facility] [address] [place], [place], [place]   [facility] [address],  [place], [place]   [facility] Programs - Intensive Outpatient [address], [place], [place]   [facility] [place].,  [place], [place] [phone] or [phone]   [facility]idential Treatment Services (RTS) [address], [place], [place] Accepts Medicaid  [facility] [place] [address]  [place] [phone] Substance Abuse/Addiction Treatment   [facility] Organization         Address  Phone  Notes  [facility]  [phone]   Domenic Schwab, PhD [address] Arlis [place], [place]   [phone] or [phone]   [place] Elbert Ripley [place], [place] [phone]   [facility] [address], [place], [place] [phone] Insurance/Medicaid/sponsorship through [facility] and Families [address], [address]                                     [place], [place] [phone] [facility] [address], [place] [phone]    Dr. Adele Schilder  [phone]   [facility] Dept. 1) [address], [place] 2) [place] 3)  [place] [address], [place] [phone]  [phone]   [place] [phone] or [phone] (After Hours)

## 2013-09-22 NOTE — Progress Notes (Signed)
  CARE MANAGEMENT ED NOTE 09/22/2013  Patient:  David Wyatt, David Wyatt   Account Number:  [id number]  Date Initiated:  09/22/2013  Documentation initiated by:  Edwyna Shell  Subjective/Objective Assessment:   53 yo male presenting to the ED with back pain     Subjective/Objective Assessment Detail:     Action/Plan:   Action/Plan Detail:   Anticipated DC Date:       Status Recommendation to Physician:   Result of Recommendation:  Agreed    DC Planning Services  CM consult  Other    Choice offered to / List presented to:  C-1 Patient          Status of service:  Completed, signed off  ED Comments:   ED Comments Detail:  CM consulted to assist with medical follow up for back pain. CM spoke with patient and he stated that he is homeless and has chronic pain and is having a hard time getting help. The patient has an orange card for the [facility] but he stated that he has not been there for years and prefers to go to the [facility]. The patient has been seen by Dr. Annitta Needs at the [facility]. The patient was on the phone with disability services during part of the visit and they were requesting MRI results to be faxed over. This CM encouraged the patient to go to the [facility] after he gets discharged from the ED for follow up and assist with his disability case. This CM explained to the patient that for chronic management of a condition he needs to remain with a PCP and practice for treatment and follow up. The patient verbalized understanding and he stated that being homeless makes it difficult to follow up. Discussed different shelters and patient stated that he does not want to leave [place]. This CM updated that ED PA. Patient verbalized understanding and had no other questions or concerns.

## 2013-09-29 NOTE — ED Provider Notes (Signed)
Medical screening examination/treatment/procedure(s) were performed by non-physician practitioner and as supervising physician I was immediately available for consultation/collaboration.     Mirna Mires, MD 09/29/13 [phone]

## 2013-10-03 ENCOUNTER — Emergency Department (HOSPITAL_COMMUNITY)
Admission: EM | Admit: 2013-10-03 | Discharge: 2013-10-03 | Disposition: A | Discharge status: Home / Self Care | Payer: No Typology Code available for payment source | Attending: Emergency Medicine | Admitting: Emergency Medicine

## 2013-10-03 ENCOUNTER — Encounter (HOSPITAL_COMMUNITY): Payer: Self-pay | Admitting: Emergency Medicine

## 2013-10-03 DIAGNOSIS — F17200 Nicotine dependence, unspecified, uncomplicated: Secondary | ICD-10-CM | POA: Insufficient documentation

## 2013-10-03 DIAGNOSIS — I10 Essential (primary) hypertension: Secondary | ICD-10-CM | POA: Insufficient documentation

## 2013-10-03 DIAGNOSIS — G8929 Other chronic pain: Secondary | ICD-10-CM | POA: Insufficient documentation

## 2013-10-03 DIAGNOSIS — Z79899 Other long term (current) drug therapy: Secondary | ICD-10-CM | POA: Insufficient documentation

## 2013-10-03 DIAGNOSIS — G589 Mononeuropathy, unspecified: Secondary | ICD-10-CM | POA: Insufficient documentation

## 2013-10-03 DIAGNOSIS — K5900 Constipation, unspecified: Secondary | ICD-10-CM

## 2013-10-03 DIAGNOSIS — R739 Hyperglycemia, unspecified: Secondary | ICD-10-CM

## 2013-10-03 DIAGNOSIS — Z791 Long term (current) use of non-steroidal anti-inflammatories (NSAID): Secondary | ICD-10-CM | POA: Insufficient documentation

## 2013-10-03 DIAGNOSIS — M549 Dorsalgia, unspecified: Secondary | ICD-10-CM | POA: Insufficient documentation

## 2013-10-03 DIAGNOSIS — M129 Arthropathy, unspecified: Secondary | ICD-10-CM | POA: Insufficient documentation

## 2013-10-03 DIAGNOSIS — M25529 Pain in unspecified elbow: Secondary | ICD-10-CM | POA: Insufficient documentation

## 2013-10-03 DIAGNOSIS — E119 Type 2 diabetes mellitus without complications: Secondary | ICD-10-CM | POA: Insufficient documentation

## 2013-10-03 DIAGNOSIS — M25522 Pain in left elbow: Secondary | ICD-10-CM

## 2013-10-03 LAB — CBG MONITORING, ED: Glucose-Capillary: 194 mg/dL — ABNORMAL HIGH (ref 70–99)

## 2013-10-03 MED ORDER — TRAMADOL HCL 50 MG PO TABS: 50.0000 mg | ORAL_TABLET | Freq: Four times a day (QID) | ORAL | Status: DC | PRN

## 2013-10-03 MED ORDER — DOCUSATE SODIUM 100 MG PO CAPS: 100.0000 mg | ORAL_CAPSULE | Freq: Two times a day (BID) | ORAL | Status: DC

## 2013-10-03 NOTE — ED Provider Notes (Signed)
CSN: [id number]     Arrival date & time 10/03/13  1544 History   First MD Initiated Contact with Patient 10/03/13 1709     Chief Complaint  Patient presents with  . Constipation  . Arm Pain  . Hyperglycemia     (Consider location/radiation/quality/duration/timing/severity/associated sxs/prior Treatment) Patient is a 53 y.o. male presenting with constipation, arm pain, and hyperglycemia. The history is provided by the patient.  Constipation Severity:  Mild Time since last bowel movement:  2 days Timing:  Constant Progression:  Unchanged Chronicity:  Recurrent Context: narcotics   Context: not dehydration, not dietary changes and not medication   Stool description:  Hard Relieved by:  Nothing Worsened by:  Nothing tried Associated symptoms: no abdominal pain, no fever and no vomiting   Arm Pain Pertinent negatives include no abdominal pain and no shortness of breath.  Hyperglycemia Associated symptoms: no abdominal pain, no fever, no shortness of breath and no vomiting     Past Medical History  Diagnosis Date  . Hypertension   . Cocaine abuse   . Type II diabetes mellitus   . Arthritis     "hands" (07/28/2012)  . Angioedema of lips 07/28/2012    left upper (07/29/2012)  . Neuropathy   . Chronic back pain    Past Surgical History  Procedure Laterality Date  . Inguinal hernia repair  04/01/2012    Procedure: HERNIA REPAIR INGUINAL ADULT;  Surgeon: Haywood Lasso, MD;  Location: [place];  Service: General;  Laterality: Right;  . Hernia repair Right 04/01/2012  . Incision and drainage of wound      boil on back/notes 07/14/2008 (07/29/2012)  . I&d extremity Left 12/13/2012    Procedure: IRRIGATION AND DEBRIDEMENT LEFT THUMB;  Surgeon: Tennis Must, MD;  Location: [place];  Service: Orthopedics;  Laterality: Left;   Family History  Problem Relation Age of Onset  . Diabetes Father    History  Substance Use Topics  . Smoking status: Current Every Day Smoker -- 0.25 packs/day  for 30 years    Types: Cigarettes  . Smokeless tobacco: Never Used  . Alcohol Use: No    Review of Systems  Constitutional: Negative for fever and chills.  Respiratory: Negative for cough and shortness of breath.   Gastrointestinal: Positive for constipation. Negative for vomiting and abdominal pain.  All other systems reviewed and are negative.     Allergies  Review of patient's allergies indicates no known allergies.  Home Medications   Prior to Admission medications   Medication Sig Start Date End Date Taking? Authorizing Provider  cyclobenzaprine (FLEXERIL) 5 MG tablet Take 1 tablet (5 mg total) by mouth 3 (three) times daily as needed for muscle spasms. 09/12/13  Yes [name], PA-C  esomeprazole (NEXIUM) 40 MG capsule Take 40 mg by mouth daily as needed (for acid reflux).   Yes Historical Provider, MD  gabapentin (NEURONTIN) 100 MG capsule Take 300 mg by mouth 3 (three) times daily.   Yes Historical Provider, MD  glipiZIDE (GLUCOTROL XL) 10 MG 24 hr tablet Take 20 mg by mouth daily with breakfast. Per medical record 07/12/13  Yes Debbe Odea, MD  HYDROcodone-acetaminophen (NORCO/VICODIN) 5-325 MG per tablet Take 1 tablet by mouth every 4 (four) hours as needed for moderate pain or severe pain. 09/22/13  Yes Elwyn Lade, PA-C  lisinopril-hydrochlorothiazide (PRINZIDE,ZESTORETIC) 20-25 MG per tablet Take 1 tablet by mouth daily.   Yes Historical Provider, MD  metFORMIN (GLUCOPHAGE) 1000 MG tablet Take  1,000 mg by mouth 2 (two) times daily with a meal. Per medical record 07/12/13  Yes Debbe Odea, MD  naproxen (NAPROSYN) 500 MG tablet Take 1 tablet (500 mg total) by mouth 2 (two) times daily with a meal. 09/12/13  Yes [name], PA-C  traMADol (ULTRAM) 50 MG tablet Take 1 tablet (50 mg total) by mouth every 6 (six) hours as needed. 09/18/13  Yes Glendell Docker, NP   BP 124/84  Pulse 73  Temp(Src) 98.6 F (37 C) (Oral)  Resp 20  Ht 5\' 8"  (1.727 m)  Wt 165 lb  (74.844 kg)  BMI 25.09 kg/m2  SpO2 96% Physical Exam  Nursing note and vitals reviewed. Constitutional: He is oriented to person, place, and time. He appears well-developed and well-nourished. No distress.  HENT:  Head: Normocephalic and atraumatic.  Mouth/Throat: No oropharyngeal exudate.  Eyes: EOM are normal. Pupils are equal, round, and reactive to light.  Neck: Normal range of motion. Neck supple.  Cardiovascular: Normal rate and regular rhythm.  Exam reveals no friction rub.   No murmur heard. Pulmonary/Chest: Effort normal and breath sounds normal. No respiratory distress. He has no wheezes. He has no rales.  Abdominal: He exhibits no distension. There is no tenderness. There is no rebound.  Musculoskeletal: Normal range of motion. He exhibits no edema.       Left elbow: He exhibits swelling (mild, diffuse). He exhibits normal range of motion, no effusion, no deformity and no laceration. No tenderness found.  Neurological: He is alert and oriented to person, place, and time.  Skin: He is not diaphoretic.    ED Course  Procedures (including critical care time) Labs Review Labs Reviewed  CBG MONITORING, ED - Abnormal; Notable for the following:    Glucose-Capillary 194 (*)    All other components within normal limits    Imaging Review No results found.   EKG Interpretation None      MDM   Final diagnoses:  Constipation, unspecified constipation type  Left elbow pain  Chronic back pain  Hyperglycemia    53 year old male presents with constipation and arm pain. He also reportedly triage scared about his blood sugar and got some back pain. Constipation:. No BM in 2 days, has been straining. No belly pain, vomiting. Informed him he needs to be hydrated and start some stool softeners. He refused a rectal exam. Given prescription for Colace. Back pain: Chronic. States no major changes. He needs surgery but cannot afford it due to lack of insurance. He asked for  Percocet. Will give tramadol. Hyperglycemia: doesn't check his sugars, was 194 today. Takes his medications. No vomiting, no abdominal pain. Normal vitals, well appearing. Doubt DKA, no need for labs, informed him to f/u with his regular doctor. Elbow pain: Hx of same - had ace wrap, wants another one today. Mild L elbow swelling, full ROM, no tenderness, no effusion. No concern for septic arthritis.   Stable for discharge.  Osvaldo Shipper, MD 10/03/13 [phone]

## 2013-10-03 NOTE — Discharge Instructions (Signed)

## 2013-10-03 NOTE — ED Notes (Signed)
Pt requesting social work for transports help, no bus pass available.

## 2013-10-03 NOTE — ED Notes (Addendum)
He has multiple complaints. he states "ive been constipated and my arms been hurting and im scared my blood sugar is up and my back hurts"

## 2013-10-26 ENCOUNTER — Emergency Department (INDEPENDENT_AMBULATORY_CARE_PROVIDER_SITE_OTHER)
Admission: EM | Admit: 2013-10-26 | Discharge: 2013-10-26 | Disposition: A | Discharge status: Home / Self Care | Payer: Self-pay | Source: Home / Self Care

## 2013-10-26 ENCOUNTER — Encounter (HOSPITAL_COMMUNITY): Payer: Self-pay | Admitting: Emergency Medicine

## 2013-10-26 DIAGNOSIS — G8929 Other chronic pain: Secondary | ICD-10-CM

## 2013-10-26 DIAGNOSIS — M545 Low back pain, unspecified: Secondary | ICD-10-CM

## 2013-10-26 MED ORDER — METHYLPREDNISOLONE ACETATE 40 MG/ML IJ SUSP
80.0000 mg | Freq: Once | INTRAMUSCULAR | Status: AC
Start: 2013-10-26 — End: 2013-10-26
  Administered 2013-10-26: 80 mg via INTRAMUSCULAR

## 2013-10-26 MED ORDER — TRIAMCINOLONE ACETONIDE 40 MG/ML IJ SUSP
INTRAMUSCULAR | Status: AC
  Filled 2013-10-26: qty 1

## 2013-10-26 MED ORDER — METHYLPREDNISOLONE ACETATE 80 MG/ML IJ SUSP
INTRAMUSCULAR | Status: AC
  Filled 2013-10-26: qty 1

## 2013-10-26 MED ORDER — TRIAMCINOLONE ACETONIDE 40 MG/ML IJ SUSP
40.0000 mg | Freq: Once | INTRAMUSCULAR | Status: AC
  Administered 2013-10-26: 40 mg via INTRAMUSCULAR

## 2013-10-26 NOTE — Discharge Instructions (Signed)
See your doctor for referral to chronic pain management.

## 2013-10-26 NOTE — ED Notes (Signed)
Pt c/o back pain onset today Hx of spinal stenosis and pinched nerve Denies recent inj/trauma, urinary sx BP today is 214/79; notified Dr. Juventino Slovak Denies CP, SOB, weakness, diaphoresis Alert, talking in complete sentences w/no signs of acute distress.

## 2013-10-26 NOTE — ED Provider Notes (Signed)
CSN: [id number]     Arrival date & time 10/26/13  1241 History   None    Chief Complaint  Patient presents with  . Back Pain   (Consider location/radiation/quality/duration/timing/severity/associated sxs/prior Treatment) Patient is a 53 y.o. male presenting with back pain. The history is provided by the patient.  Back Pain Location:  Lumbar spine Quality:  Shooting Radiates to:  Does not radiate Pain severity:  Moderate Onset quality:  Gradual Progression:  Unchanged Chronicity:  Chronic Context comment:  Numerous er and ucc visits for same, no gi or gu incont., has not had orthoped f/u with dr Percell Miller as advised. Exacerbated by: weather change.   Past Medical History  Diagnosis Date  . Hypertension   . Cocaine abuse   . Type II diabetes mellitus   . Arthritis     "hands" (07/28/2012)  . Angioedema of lips 07/28/2012    left upper (07/29/2012)  . Neuropathy   . Chronic back pain    Past Surgical History  Procedure Laterality Date  . Inguinal hernia repair  04/01/2012    Procedure: HERNIA REPAIR INGUINAL ADULT;  Surgeon: Haywood Lasso, MD;  Location: [place];  Service: General;  Laterality: Right;  . Hernia repair Right 04/01/2012  . Incision and drainage of wound      boil on back/notes 07/14/2008 (07/29/2012)  . I&d extremity Left 12/13/2012    Procedure: IRRIGATION AND DEBRIDEMENT LEFT THUMB;  Surgeon: Tennis Must, MD;  Location: [place];  Service: Orthopedics;  Laterality: Left;   Family History  Problem Relation Age of Onset  . Diabetes Father    History  Substance Use Topics  . Smoking status: Current Every Day Smoker -- 0.25 packs/day for 30 years    Types: Cigarettes  . Smokeless tobacco: Never Used  . Alcohol Use: No    Review of Systems  Constitutional: Negative.   Cardiovascular: Negative for leg swelling.  Gastrointestinal: Negative.   Genitourinary: Negative.   Musculoskeletal: Positive for back pain and gait problem.    Allergies  Review of  patient's allergies indicates no known allergies.  Home Medications   Prior to Admission medications   Medication Sig Start Date End Date Taking? Authorizing Provider  glipiZIDE (GLUCOTROL XL) 10 MG 24 hr tablet Take 20 mg by mouth daily with breakfast. Per medical record 07/12/13  Yes Debbe Odea, MD  HYDROcodone-acetaminophen (NORCO/VICODIN) 5-325 MG per tablet Take 1 tablet by mouth every 4 (four) hours as needed for moderate pain or severe pain. 09/22/13  Yes Elwyn Lade, PA-C  lisinopril-hydrochlorothiazide (PRINZIDE,ZESTORETIC) 20-25 MG per tablet Take 1 tablet by mouth daily.   Yes Historical Provider, MD  metFORMIN (GLUCOPHAGE) 1000 MG tablet Take 1,000 mg by mouth 2 (two) times daily with a meal. Per medical record 07/12/13  Yes Debbe Odea, MD  cyclobenzaprine (FLEXERIL) 5 MG tablet Take 1 tablet (5 mg total) by mouth 3 (three) times daily as needed for muscle spasms. 09/12/13   [name], PA-C  docusate sodium (COLACE) 100 MG capsule Take 1 capsule (100 mg total) by mouth every 12 (twelve) hours. 10/03/13   Evelina Bucy, MD  esomeprazole (NEXIUM) 40 MG capsule Take 40 mg by mouth daily as needed (for acid reflux).    Historical Provider, MD  gabapentin (NEURONTIN) 100 MG capsule Take 300 mg by mouth 3 (three) times daily.    Historical Provider, MD  naproxen (NAPROSYN) 500 MG tablet Take 1 tablet (500 mg total) by mouth 2 (two) times daily  with a meal. 09/12/13   [name], PA-C  traMADol (ULTRAM) 50 MG tablet Take 1 tablet (50 mg total) by mouth every 6 (six) hours as needed. 09/18/13   Glendell Docker, NP  traMADol (ULTRAM) 50 MG tablet Take 1 tablet (50 mg total) by mouth every 6 (six) hours as needed. 10/03/13   Evelina Bucy, MD   BP 122/84  Pulse 73  Temp(Src) 98.9 F (37.2 C) (Oral)  Resp 14  SpO2 100% Physical Exam  Nursing note and vitals reviewed. Constitutional: He is oriented to person, place, and time. He appears well-developed and well-nourished.   Abdominal: Soft. Bowel sounds are normal. He exhibits no distension and no mass. There is no tenderness. There is no rebound and no guarding.  Musculoskeletal:  Neg slr, ambulatory, nl ehl and toe sens.  Neurological: He is alert and oriented to person, place, and time.  Skin: Skin is warm and dry.    ED Course  Procedures (including critical care time) Labs Review Labs Reviewed - No data to display  Imaging Review No results found.   MDM   1. Chronic lower back pain        Billy Fischer, MD 10/26/13 [phone]

## 2013-11-01 ENCOUNTER — Encounter: Payer: Self-pay | Admitting: Internal Medicine

## 2013-11-01 ENCOUNTER — Ambulatory Visit: Payer: Self-pay | Attending: Internal Medicine | Admitting: Internal Medicine

## 2013-11-01 VITALS — BP 128/70 | HR 86 | Temp 98.2°F | Resp 15 | Wt 169.4 lb

## 2013-11-01 DIAGNOSIS — F1410 Cocaine abuse, uncomplicated: Secondary | ICD-10-CM | POA: Insufficient documentation

## 2013-11-01 DIAGNOSIS — E119 Type 2 diabetes mellitus without complications: Secondary | ICD-10-CM | POA: Insufficient documentation

## 2013-11-01 DIAGNOSIS — G8929 Other chronic pain: Secondary | ICD-10-CM | POA: Insufficient documentation

## 2013-11-01 DIAGNOSIS — F17200 Nicotine dependence, unspecified, uncomplicated: Secondary | ICD-10-CM | POA: Insufficient documentation

## 2013-11-01 DIAGNOSIS — M545 Low back pain, unspecified: Secondary | ICD-10-CM | POA: Insufficient documentation

## 2013-11-01 DIAGNOSIS — E139 Other specified diabetes mellitus without complications: Secondary | ICD-10-CM

## 2013-11-01 DIAGNOSIS — Z79899 Other long term (current) drug therapy: Secondary | ICD-10-CM | POA: Insufficient documentation

## 2013-11-01 DIAGNOSIS — E089 Diabetes mellitus due to underlying condition without complications: Secondary | ICD-10-CM

## 2013-11-01 DIAGNOSIS — I10 Essential (primary) hypertension: Secondary | ICD-10-CM | POA: Insufficient documentation

## 2013-11-01 DIAGNOSIS — M19049 Primary osteoarthritis, unspecified hand: Secondary | ICD-10-CM | POA: Insufficient documentation

## 2013-11-01 DIAGNOSIS — M549 Dorsalgia, unspecified: Secondary | ICD-10-CM

## 2013-11-01 LAB — GLUCOSE, POCT (MANUAL RESULT ENTRY): POC Glucose: 119 mg/dl — AB (ref 70–99)

## 2013-11-01 LAB — POCT GLYCOSYLATED HEMOGLOBIN (HGB A1C): Hemoglobin A1C: 7

## 2013-11-01 MED ORDER — CYCLOBENZAPRINE HCL 5 MG PO TABS: 5.0000 mg | ORAL_TABLET | Freq: Every day | ORAL | Status: DC

## 2013-11-01 MED ORDER — TRAMADOL HCL 50 MG PO TABS: 50.0000 mg | ORAL_TABLET | Freq: Two times a day (BID) | ORAL | Status: DC | PRN

## 2013-11-01 NOTE — Progress Notes (Signed)
Patient here for follow up on his diabetes and HTN

## 2013-11-01 NOTE — Progress Notes (Signed)
MRN: [id number] Name: David Wyatt  Sex: male Age: 53 y.o. DOB: 29-Jan-1961  Allergies: Review of patient's allergies indicates no known allergies.  Chief Complaint  Patient presents with  . Follow-up    dm    HPI: Patient is 53 y.o. male who has to of diabetes hypertension chronic lower back pain secondary to lumbar disc herniation comes today for followup, he denies any hypoglycemic symptoms his diabetes is well controlled his hemoglobin A1c is 7.0%, is complaining of lower back pain and requesting some medication, he has already been referred to neurosurgery. Patient denies any fever chills chest and shortness of breath.  Past Medical History  Diagnosis Date  . Hypertension   . Cocaine abuse   . Type II diabetes mellitus   . Arthritis     "hands" (07/28/2012)  . Angioedema of lips 07/28/2012    left upper (07/29/2012)  . Neuropathy   . Chronic back pain     Past Surgical History  Procedure Laterality Date  . Inguinal hernia repair  04/01/2012    Procedure: HERNIA REPAIR INGUINAL ADULT;  Surgeon: Haywood Lasso, MD;  Location: [employer];  Service: General;  Laterality: Right;  . Hernia repair Right 04/01/2012  . Incision and drainage of wound      boil on back/notes 07/14/2008 (07/29/2012)  . I&d extremity Left 12/13/2012    Procedure: IRRIGATION AND DEBRIDEMENT LEFT THUMB;  Surgeon: Tennis Must, MD;  Location: [place];  Service: Orthopedics;  Laterality: Left;      Medication List       This list is accurate as of: 11/01/13 10:27 AM.  Always use your most recent med list.               cyclobenzaprine 5 MG tablet  Commonly known as:  FLEXERIL  Take 1 tablet (5 mg total) by mouth 3 (three) times daily as needed for muscle spasms.     docusate sodium 100 MG capsule  Commonly known as:  COLACE  Take 1 capsule (100 mg total) by mouth every 12 (twelve) hours.     esomeprazole 40 MG capsule  Commonly known as:  NEXIUM  Take 40 mg by mouth daily as needed (for acid  reflux).     gabapentin 100 MG capsule  Commonly known as:  NEURONTIN  Take 300 mg by mouth 3 (three) times daily.     glipiZIDE 10 MG 24 hr tablet  Commonly known as:  GLUCOTROL XL  Take 20 mg by mouth daily with breakfast. Per medical record     HYDROcodone-acetaminophen 5-325 MG per tablet  Commonly known as:  NORCO/VICODIN  Take 1 tablet by mouth every 4 (four) hours as needed for moderate pain or severe pain.     lisinopril-hydrochlorothiazide 20-25 MG per tablet  Commonly known as:  PRINZIDE,ZESTORETIC  Take 1 tablet by mouth daily.     metFORMIN 1000 MG tablet  Commonly known as:  GLUCOPHAGE  Take 1,000 mg by mouth 2 (two) times daily with a meal. Per medical record     naproxen 500 MG tablet  Commonly known as:  NAPROSYN  Take 1 tablet (500 mg total) by mouth 2 (two) times daily with a meal.     traMADol 50 MG tablet  Commonly known as:  ULTRAM  Take 1 tablet (50 mg total) by mouth every 6 (six) hours as needed.     traMADol 50 MG tablet  Commonly known as:  ULTRAM  Take 1 tablet (  50 mg total) by mouth every 6 (six) hours as needed.        No orders of the defined types were placed in this encounter.    Immunization History  Administered Date(s) Administered  . Pneumococcal Polysaccharide-23 04/02/2012  . Tdap 12/13/2012, 04/09/2013    Family History  Problem Relation Age of Onset  . Diabetes Father     History  Substance Use Topics  . Smoking status: Current Every Day Smoker -- 0.25 packs/day for 30 years    Types: Cigarettes  . Smokeless tobacco: Never Used  . Alcohol Use: No    Review of Systems   As noted in HPI  Filed Vitals:   11/01/13 1000  BP: 128/70  Pulse: 86  Temp: 98.2 F (36.8 C)  Resp: 15    Physical Exam  Physical Exam  Constitutional: No distress.  Eyes: EOM are normal. Pupils are equal, round, and reactive to light.  Cardiovascular: Normal rate and regular rhythm.   Pulmonary/Chest: Breath sounds normal. No  respiratory distress. He has no wheezes. He has no rales.  Musculoskeletal:  Lower lumbar spinal and paraspinal tenderness     CBC    Component Value Date/Time   WBC 5.3 09/18/2013 1104   RBC 4.09* 09/18/2013 1104   HGB 12.8* 09/18/2013 1104   HCT 38.8* 09/18/2013 1104   PLT 380 09/18/2013 1104   MCV 94.9 09/18/2013 1104   LYMPHSABS 2.0 05/05/2013 2110   MONOABS 0.6 05/05/2013 2110   EOSABS 0.3 05/05/2013 2110   BASOSABS 0.0 05/05/2013 2110    CMP     Component Value Date/Time   NA 145 09/18/2013 1104   K 3.6* 09/18/2013 1104   CL 105 09/18/2013 1104   CO2 25 09/18/2013 1104   GLUCOSE 158* 09/18/2013 1104   BUN 14 09/18/2013 1104   CREATININE 1.00 09/18/2013 1104   CALCIUM 9.0 09/18/2013 1104   PROT 8.1 05/05/2013 2110   ALBUMIN 3.5 05/05/2013 2110   AST 20 05/05/2013 2110   ALT 17 05/05/2013 2110   ALKPHOS 74 05/05/2013 2110   BILITOT 0.6 05/05/2013 2110   GFRNONAA 84* 09/18/2013 1104   GFRAA >90 09/18/2013 1104    No results found for this basename: chol, tri, ldl    No components found with this basename: hga1c    Lab Results  Component Value Date/Time   AST 20 05/05/2013  9:10 PM    Assessment and Plan  Diabetes mellitus due to underlying condition without complications - Plan:  Results for orders placed in visit on 11/01/13  GLUCOSE, POCT (MANUAL RESULT ENTRY)      Result Value Ref Range   POC Glucose 119 (*) 70 - 99 mg/dl  POCT GLYCOSYLATED HEMOGLOBIN (HGB A1C)      Result Value Ref Range   Hemoglobin A1C 7.0     Diabetes is fairly well controlled her continue with metformin and Glucotrol, advised for diabetes meal planning.  Essential hypertension, benign Blood pressure is well controlled currently on lisinopril/hydrochlorothiazide. Will repeat blood chemistry on the next visit  Chronic back pain I prescribed tramadol and Flexeril, patient has already been referred to neurosurgery, he'll see a Development worker, community.    Return in about 3 months (around 02/01/2014)  for diabetes, hypertension, back pain.  Lorayne Marek, MD

## 2013-11-03 ENCOUNTER — Telehealth: Payer: Self-pay | Admitting: *Deleted

## 2013-11-03 ENCOUNTER — Encounter: Payer: Self-pay | Admitting: *Deleted

## 2013-11-03 ENCOUNTER — Other Ambulatory Visit: Payer: Self-pay

## 2013-11-03 DIAGNOSIS — E119 Type 2 diabetes mellitus without complications: Secondary | ICD-10-CM

## 2013-11-03 DIAGNOSIS — E089 Diabetes mellitus due to underlying condition without complications: Secondary | ICD-10-CM

## 2013-11-03 MED ORDER — GLIPIZIDE ER 10 MG PO TB24: 20.0000 mg | ORAL_TABLET | Freq: Every day | ORAL | Status: DC

## 2013-11-03 MED ORDER — GLUCOSE BLOOD VI STRP: ORAL_STRIP | Status: DC

## 2013-11-03 NOTE — Telephone Encounter (Signed)
If the patient did not get the prescription then it's okay to reprint it.

## 2013-11-03 NOTE — Telephone Encounter (Signed)
Patient in today for Tramadol prescription. I seen that it was printed 11/01/2013 but I cant find it. Is it ok for me to reprint this and you sign this?

## 2013-11-04 NOTE — Telephone Encounter (Signed)
Patient states he did receive the prescription on 11/01/2013, but he did not get it filled because he did not have any money. Explained to patient with that type of medication he will have to pay for it. Patient verbalized understanding. Vivia Birmingham, RN

## 2013-11-08 ENCOUNTER — Telehealth: Payer: Self-pay | Admitting: Internal Medicine

## 2013-11-08 NOTE — Telephone Encounter (Signed)
Pt call requesting medication refill for glipiZIDE (GLUCOTROL XL) 10 MG 24 hr tablet. Pls contact pt.

## 2013-11-09 ENCOUNTER — Encounter: Payer: Self-pay | Admitting: *Deleted

## 2013-11-09 NOTE — Telephone Encounter (Signed)
Pt has refills at his pharmacy

## 2013-11-09 NOTE — Progress Notes (Signed)
Pt called requesting refills of some of his medication. I let the pt know that he had refills waiting in the outpatient pharmacy.

## 2013-11-21 ENCOUNTER — Encounter (HOSPITAL_COMMUNITY): Payer: Self-pay | Admitting: Emergency Medicine

## 2013-11-21 ENCOUNTER — Emergency Department (HOSPITAL_COMMUNITY)
Admission: EM | Admit: 2013-11-21 | Discharge: 2013-11-21 | Disposition: A | Discharge status: Home / Self Care | Payer: Self-pay | Attending: Emergency Medicine | Admitting: Emergency Medicine

## 2013-11-21 DIAGNOSIS — Z79899 Other long term (current) drug therapy: Secondary | ICD-10-CM | POA: Insufficient documentation

## 2013-11-21 DIAGNOSIS — E119 Type 2 diabetes mellitus without complications: Secondary | ICD-10-CM | POA: Insufficient documentation

## 2013-11-21 DIAGNOSIS — I10 Essential (primary) hypertension: Secondary | ICD-10-CM | POA: Insufficient documentation

## 2013-11-21 DIAGNOSIS — Z791 Long term (current) use of non-steroidal anti-inflammatories (NSAID): Secondary | ICD-10-CM | POA: Insufficient documentation

## 2013-11-21 DIAGNOSIS — M129 Arthropathy, unspecified: Secondary | ICD-10-CM | POA: Insufficient documentation

## 2013-11-21 DIAGNOSIS — F17200 Nicotine dependence, unspecified, uncomplicated: Secondary | ICD-10-CM | POA: Insufficient documentation

## 2013-11-21 DIAGNOSIS — M545 Low back pain, unspecified: Secondary | ICD-10-CM | POA: Insufficient documentation

## 2013-11-21 DIAGNOSIS — G589 Mononeuropathy, unspecified: Secondary | ICD-10-CM | POA: Insufficient documentation

## 2013-11-21 DIAGNOSIS — M79609 Pain in unspecified limb: Secondary | ICD-10-CM | POA: Insufficient documentation

## 2013-11-21 DIAGNOSIS — G8929 Other chronic pain: Secondary | ICD-10-CM | POA: Insufficient documentation

## 2013-11-21 MED ORDER — TRAMADOL HCL 50 MG PO TABS
50.0000 mg | ORAL_TABLET | Freq: Once | ORAL | Status: AC
  Administered 2013-11-21: 50 mg via ORAL
  Filled 2013-11-21: qty 1

## 2013-11-21 MED ORDER — METHOCARBAMOL 500 MG PO TABS
1000.0000 mg | ORAL_TABLET | Freq: Once | ORAL | Status: AC
  Administered 2013-11-21: 1000 mg via ORAL
  Filled 2013-11-21: qty 2

## 2013-11-21 NOTE — ED Notes (Signed)
PA now talking with pt. Pt will be given referral for specialist.

## 2013-11-21 NOTE — ED Provider Notes (Signed)
CSN: [id number]     Arrival date & time 11/21/13  1243 History   First MD Initiated Contact with Patient 11/21/13 1334     Chief Complaint  Patient presents with  . Back Pain  . Leg Pain   (Consider location/radiation/quality/duration/timing/severity/associated sxs/prior Treatment) HPI David Wyatt is a 53 yo male with PMH: HTN, chronic back pain, DM, and cocaine abuse, presenting to the ED with c/o left lower back pain.  Pt states this is an ongoing problem, but thinks it got worse yesterday.  He denies any new injury.  He he describes the pain as aching and constant and rates it as 10/10. He also has pain in both legs and it worse when he walks.  He denies fever, worsening pain at night, bowel or bladder incontinence, chest pain, shortness of breath or saddle parasthesia.  Past Medical History  Diagnosis Date  . Hypertension   . Cocaine abuse   . Type II diabetes mellitus   . Arthritis     "hands" (07/28/2012)  . Angioedema of lips 07/28/2012    left upper (07/29/2012)  . Neuropathy   . Chronic back pain    Past Surgical History  Procedure Laterality Date  . Inguinal hernia repair  04/01/2012    Procedure: HERNIA REPAIR INGUINAL ADULT;  Surgeon: Haywood Lasso, MD;  Location: [place];  Service: General;  Laterality: Right;  . Hernia repair Right 04/01/2012  . Incision and drainage of wound      boil on back/notes 07/14/2008 (07/29/2012)  . I&d extremity Left 12/13/2012    Procedure: IRRIGATION AND DEBRIDEMENT LEFT THUMB;  Surgeon: Tennis Must, MD;  Location: [place];  Service: Orthopedics;  Laterality: Left;   Family History  Problem Relation Age of Onset  . Diabetes Father    History  Substance Use Topics  . Smoking status: Current Every Day Smoker -- 0.25 packs/day for 30 years    Types: Cigarettes  . Smokeless tobacco: Never Used  . Alcohol Use: No    Review of Systems  Constitutional: Negative for fever and chills.  HENT: Negative for sore throat.   Eyes: Negative for  visual disturbance.  Respiratory: Negative for cough and shortness of breath.   Cardiovascular: Negative for chest pain and leg swelling.  Gastrointestinal: Negative for nausea, vomiting and diarrhea.  Genitourinary: Negative for dysuria and difficulty urinating.  Musculoskeletal: Positive for back pain and gait problem. Negative for myalgias.  Skin: Negative for rash.  Neurological: Negative for weakness, numbness and headaches.    Allergies  Review of patient's allergies indicates no known allergies.  Home Medications   Prior to Admission medications   Medication Sig Start Date End Date Taking? Authorizing Provider  cyclobenzaprine (FLEXERIL) 5 MG tablet Take 1 tablet (5 mg total) by mouth at bedtime. 11/01/13  Yes Lorayne Marek, MD  esomeprazole (NEXIUM) 40 MG capsule Take 40 mg by mouth daily as needed (for acid reflux).   Yes Historical Provider, MD  gabapentin (NEURONTIN) 100 MG capsule Take 300 mg by mouth 3 (three) times daily.   Yes Historical Provider, MD  glipiZIDE (GLUCOTROL XL) 10 MG 24 hr tablet Take 2 tablets (20 mg total) by mouth daily with breakfast. Per medical record 11/03/13  Yes Lorayne Marek, MD  HYDROcodone-acetaminophen (NORCO/VICODIN) 5-325 MG per tablet Take 1 tablet by mouth every 4 (four) hours as needed for moderate pain or severe pain. 09/22/13  Yes Elwyn Lade, PA-C  lisinopril-hydrochlorothiazide (PRINZIDE,ZESTORETIC) 20-25 MG per tablet Take 1 tablet  by mouth daily.   Yes Historical Provider, MD  metFORMIN (GLUCOPHAGE) 1000 MG tablet Take 1,000 mg by mouth 2 (two) times daily with a meal. Per medical record 07/12/13  Yes Debbe Odea, MD  naproxen (NAPROSYN) 500 MG tablet Take 1 tablet (500 mg total) by mouth 2 (two) times daily with a meal. 09/12/13  Yes [name], PA-C  traMADol (ULTRAM) 50 MG tablet Take 1 tablet (50 mg total) by mouth every 12 (twelve) hours as needed. 11/01/13  Yes Deepak Advani, MD   BP 115/73  Pulse 73  Temp(Src) 97.7 F (36.5  C) (Oral)  Resp 18  SpO2 98% Physical Exam  Nursing note and vitals reviewed. Constitutional: He is oriented to person, place, and time. He appears well-developed and well-nourished. No distress.  HENT:  Head: Normocephalic and atraumatic.  Mouth/Throat: Oropharynx is clear and moist. No oropharyngeal exudate.  Eyes: Conjunctivae are normal.  Neck: Neck supple. No thyromegaly present.  Cardiovascular: Normal rate, regular rhythm and intact distal pulses.   Pulmonary/Chest: Effort normal and breath sounds normal. No respiratory distress. He has no wheezes. He has no rales. He exhibits no tenderness.  Abdominal: Soft. There is no tenderness.  Musculoskeletal: He exhibits tenderness.       Lumbar back: He exhibits pain. He exhibits normal range of motion, no bony tenderness and no swelling.       Back:  Lymphadenopathy:    He has no cervical adenopathy.  Neurological: He is alert and oriented to person, place, and time. He has normal strength. No cranial nerve deficit or sensory deficit. Gait abnormal. GCS eye subscore is 4. GCS verbal subscore is 5. GCS motor subscore is 6.  Able to walk quickly, but with a limp  Skin: Skin is warm and dry. No rash noted. He is not diaphoretic.  Psychiatric: He has a normal mood and affect.    ED Course  Procedures (including critical care time) Labs Review Labs Reviewed - No data to display  Imaging Review No results found.   EKG Interpretation None      MDM   Final diagnoses:  Left-sided low back pain without sciatica   53 yo with recurrent and chronic back pain. No new injury or worsening symptoms, but continued pain. No neurological deficits and normal neuro exam.  Patient can walk but states is painful.  No loss of bowel or bladder control.  No concern for cauda equina.  No fever, night sweats, weight loss, h/o cancer, or report of IVDU.  Pt appears uninterested in answering exam questions.  Discussed with pt the importance of  follow-up with his pcp and back specialist for better pain management.  He stated he had several Medicare/Medicaid/disability cases pending and a referral would not help him. Tramadol and Robaxin given in the ED.  On repeat exam, pt sleeping.  Discharge instructions include the importance of following up with PCP.  Pt did not want prescriptions for meds given in the ED because he already had those at home.  Pt frustrated by not receiving prescription for vicodin or percocet.  Then requested ortho referral, referral and return precautions provided  Filed Vitals:   11/21/13 1248 11/21/13 1258 11/21/13 1439  BP: 115/73  132/76  Pulse: 73  89  Temp:  97.7 F (36.5 C) 98 F (36.7 C)  TempSrc:  Oral   Resp: 18  18  SpO2: 98%  98%    Meds given in ED:  Medications  traMADol (ULTRAM) tablet 50 mg (  50 mg Oral Given 11/21/13 1359)  methocarbamol (ROBAXIN) tablet 1,000 mg (1,000 mg Oral Given 11/21/13 1400)    Discharge Medication List as of 11/21/2013  3:22 PM         Britt Bottom, NP 11/24/13 1712

## 2013-11-21 NOTE — Discharge Instructions (Signed)
Please follow the directions provided.  Be sure to follow up with your primary care provider to help manage this recurrent back pain.   You may use ibuprofen 600 mg by mouth every 6-8 hours for pain. Gentle stretching, heat and ice can also help ease back pain.  SEEK IMMEDIATE MEDICAL ATTENTION IF: New numbness, tingling, weakness, or problem with the use of your arms or legs.  Severe back pain not relieved with medications.  Change in bowel or bladder control.  Increasing pain in any areas of the body (such as chest or abdominal pain).  Shortness of breath, dizziness or fainting.  Nausea (feeling sick to your stomach), vomiting, fever, or sweats.

## 2013-11-21 NOTE — ED Notes (Addendum)
Pt reports chronic lower left back pain and left leg pain x2 years. Reports he is out of pain medicine. 10/10. Ambulatory with cane. Reports he has had MRIs in past and that his "back is curved".

## 2013-11-22 ENCOUNTER — Encounter: Payer: Self-pay | Admitting: *Deleted

## 2013-11-22 ENCOUNTER — Ambulatory Visit: Payer: Self-pay | Attending: Internal Medicine | Admitting: Internal Medicine

## 2013-11-22 ENCOUNTER — Telehealth: Payer: Self-pay | Admitting: Internal Medicine

## 2013-11-22 ENCOUNTER — Ambulatory Visit: Payer: Self-pay | Attending: Internal Medicine

## 2013-11-22 ENCOUNTER — Encounter: Payer: Self-pay | Admitting: Internal Medicine

## 2013-11-22 VITALS — BP 135/84 | HR 70 | Temp 98.8°F | Resp 16 | Wt 165.2 lb

## 2013-11-22 DIAGNOSIS — F17200 Nicotine dependence, unspecified, uncomplicated: Secondary | ICD-10-CM

## 2013-11-22 DIAGNOSIS — I10 Essential (primary) hypertension: Secondary | ICD-10-CM | POA: Insufficient documentation

## 2013-11-22 DIAGNOSIS — Z23 Encounter for immunization: Secondary | ICD-10-CM

## 2013-11-22 DIAGNOSIS — E119 Type 2 diabetes mellitus without complications: Secondary | ICD-10-CM | POA: Insufficient documentation

## 2013-11-22 DIAGNOSIS — E139 Other specified diabetes mellitus without complications: Secondary | ICD-10-CM

## 2013-11-22 DIAGNOSIS — M549 Dorsalgia, unspecified: Secondary | ICD-10-CM

## 2013-11-22 DIAGNOSIS — M539 Dorsopathy, unspecified: Secondary | ICD-10-CM | POA: Insufficient documentation

## 2013-11-22 DIAGNOSIS — E089 Diabetes mellitus due to underlying condition without complications: Secondary | ICD-10-CM

## 2013-11-22 DIAGNOSIS — Z599 Problem related to housing and economic circumstances, unspecified: Secondary | ICD-10-CM

## 2013-11-22 DIAGNOSIS — Z79899 Other long term (current) drug therapy: Secondary | ICD-10-CM | POA: Insufficient documentation

## 2013-11-22 DIAGNOSIS — G8929 Other chronic pain: Secondary | ICD-10-CM

## 2013-11-22 DIAGNOSIS — Z791 Long term (current) use of non-steroidal anti-inflammatories (NSAID): Secondary | ICD-10-CM | POA: Insufficient documentation

## 2013-11-22 LAB — GLUCOSE, POCT (MANUAL RESULT ENTRY): POC Glucose: 162 mg/dl — AB (ref 70–99)

## 2013-11-22 MED ORDER — NAPROXEN 500 MG PO TABS
500.0000 mg | ORAL_TABLET | Freq: Two times a day (BID) | ORAL | Status: DC
Start: 2013-11-22 — End: 2014-02-16

## 2013-11-22 MED ORDER — CYCLOBENZAPRINE HCL 5 MG PO TABS: 5.0000 mg | ORAL_TABLET | Freq: Every day | ORAL | Status: DC

## 2013-11-22 NOTE — Progress Notes (Signed)
Patient complains of lower back pain that radiates Down his legs Denies any injury

## 2013-11-22 NOTE — Progress Notes (Signed)
MRN: [id number] Name: David Wyatt  Sex: male Age: 53 y.o. DOB: 22-Nov-1960  Allergies: Review of patient's allergies indicates no known allergies.  Chief Complaint  Patient presents with  . Back Pain    HPI: Patient is 53 y.o. male who has to of chronic back pain, diabetes, he has already been referred to neurosurgery, patient also has financial issues and could not afford the medication, as per patient he has seen a Development worker, community today and needs medication for pain, chronic low back pain this pain which is unchanged, patient still smokes cigarettes, I have advised patient to quit smoking. Patient denies fever chills chest and shortness of breath any incontinence.  Past Medical History  Diagnosis Date  . Hypertension   . Cocaine abuse   . Type II diabetes mellitus   . Arthritis     "hands" (07/28/2012)  . Angioedema of lips 07/28/2012    left upper (07/29/2012)  . Neuropathy   . Chronic back pain     Past Surgical History  Procedure Laterality Date  . Inguinal hernia repair  04/01/2012    Procedure: HERNIA REPAIR INGUINAL ADULT;  Surgeon: Haywood Lasso, MD;  Location: [place];  Service: General;  Laterality: Right;  . Hernia repair Right 04/01/2012  . Incision and drainage of wound      boil on back/notes 07/14/2008 (07/29/2012)  . I&d extremity Left 12/13/2012    Procedure: IRRIGATION AND DEBRIDEMENT LEFT THUMB;  Surgeon: Tennis Must, MD;  Location: [place];  Service: Orthopedics;  Laterality: Left;      Medication List       This list is accurate as of: 11/22/13  3:11 PM.  Always use your most recent med list.               cyclobenzaprine 5 MG tablet  Commonly known as:  FLEXERIL  Take 1 tablet (5 mg total) by mouth at bedtime.     esomeprazole 40 MG capsule  Commonly known as:  NEXIUM  Take 40 mg by mouth daily as needed (for acid reflux).     gabapentin 100 MG capsule  Commonly known as:  NEURONTIN  Take 300 mg by mouth 3 (three) times daily.     glipiZIDE 10 MG 24 hr tablet  Commonly known as:  GLUCOTROL XL  Take 2 tablets (20 mg total) by mouth daily with breakfast. Per medical record     HYDROcodone-acetaminophen 5-325 MG per tablet  Commonly known as:  NORCO/VICODIN  Take 1 tablet by mouth every 4 (four) hours as needed for moderate pain or severe pain.     lisinopril-hydrochlorothiazide 20-25 MG per tablet  Commonly known as:  PRINZIDE,ZESTORETIC  Take 1 tablet by mouth daily.     metFORMIN 1000 MG tablet  Commonly known as:  GLUCOPHAGE  Take 1,000 mg by mouth 2 (two) times daily with a meal. Per medical record     naproxen 500 MG tablet  Commonly known as:  NAPROSYN  Take 1 tablet (500 mg total) by mouth 2 (two) times daily with a meal.     traMADol 50 MG tablet  Commonly known as:  ULTRAM  Take 1 tablet (50 mg total) by mouth every 12 (twelve) hours as needed.        Meds ordered this encounter  Medications  . naproxen (NAPROSYN) 500 MG tablet    Sig: Take 1 tablet (500 mg total) by mouth 2 (two) times daily with a meal.  Dispense:  30 tablet    Refill:  1  . cyclobenzaprine (FLEXERIL) 5 MG tablet    Sig: Take 1 tablet (5 mg total) by mouth at bedtime.    Dispense:  30 tablet    Refill:  1    Immunization History  Administered Date(s) Administered  . Influenza,inj,Quad PF,36+ Mos 11/22/2013  . Pneumococcal Polysaccharide-23 04/02/2012  . Tdap 12/13/2012, 04/09/2013    Family History  Problem Relation Age of Onset  . Diabetes Father     History  Substance Use Topics  . Smoking status: Current Every Day Smoker -- 0.25 packs/day for 30 years    Types: Cigarettes  . Smokeless tobacco: Never Used  . Alcohol Use: No    Review of Systems   As noted in HPI  Filed Vitals:   11/22/13 1454  BP: 135/84  Pulse: 70  Temp: 98.8 F (37.1 C)  Resp: 16    Physical Exam  Physical Exam  Constitutional: No distress.  Cardiovascular: Normal rate and regular rhythm.   Pulmonary/Chest: Breath  sounds normal. No respiratory distress. He has no wheezes. He has no rales.  Musculoskeletal:  Lower lumbar spinal and paraspinal tenderness, exam unchanged     CBC    Component Value Date/Time   WBC 5.3 09/18/2013 1104   RBC 4.09* 09/18/2013 1104   HGB 12.8* 09/18/2013 1104   HCT 38.8* 09/18/2013 1104   PLT 380 09/18/2013 1104   MCV 94.9 09/18/2013 1104   LYMPHSABS 2.0 05/05/2013 2110   MONOABS 0.6 05/05/2013 2110   EOSABS 0.3 05/05/2013 2110   BASOSABS 0.0 05/05/2013 2110    CMP     Component Value Date/Time   NA 145 09/18/2013 1104   K 3.6* 09/18/2013 1104   CL 105 09/18/2013 1104   CO2 25 09/18/2013 1104   GLUCOSE 158* 09/18/2013 1104   BUN 14 09/18/2013 1104   CREATININE 1.00 09/18/2013 1104   CALCIUM 9.0 09/18/2013 1104   PROT 8.1 05/05/2013 2110   ALBUMIN 3.5 05/05/2013 2110   AST 20 05/05/2013 2110   ALT 17 05/05/2013 2110   ALKPHOS 74 05/05/2013 2110   BILITOT 0.6 05/05/2013 2110   GFRNONAA 84* 09/18/2013 1104   GFRAA >90 09/18/2013 1104    No results found for this basename: chol,  tri,  ldl    No components found with this basename: hga1c    Lab Results  Component Value Date/Time   AST 20 05/05/2013  9:10 PM    Assessment and Plan  Chronic back pain - Plan: I have given patient refill  naproxen (NAPROSYN) 500 MG tablet, cyclobenzaprine (FLEXERIL) 5 MG tablet, he will take the prescription to pharmacy, needs assistance with the medication.  Diabetes mellitus due to underlying condition without complications - Plan: His last A1c 7.0%, patient will continued current meds. Glucose (CBG)  Smoking As per patient he smokes 2-3 cigarettes per day, Advised patient to quit smoking.  Need for prophylactic vaccination and inoculation against influenza Flu shot given today.   Return in about 3 months (around 02/21/2014) for diabetes.  Lorayne Marek, MD

## 2013-11-22 NOTE — Progress Notes (Signed)
LCSW met with patient about need for surgery and current struggles with pain management.  LCSW advised patient that referral coordinator may know more resources for management of back pain and will be here later this week.  LCSW encouraged patient to return to meet with her about a potential recommendation.  LCSW also provided patient with bus passes and food, because he had not eaten due to his appointment today. LCSW will follow as needed.  Christene Lye MSW, LCSW

## 2013-11-22 NOTE — Telephone Encounter (Signed)
Patient has come in today for financial counseling; patient is visibly in a lot of pain and has requested to see a doctor today; Patient was told that we don't have walk-ins for today however he asked me to inquire anyhow; please f/u with patient in the lobby or in the financial counselors office;

## 2013-11-25 NOTE — ED Provider Notes (Signed)
Medical screening examination/treatment/procedure(s) were conducted as a shared visit with non-physician practitioner(s) and myself.  I personally evaluated the patient during the encounter.  Pt c/o low back pain. Constant, dull. Spine nt. abd soft nt.    Mirna Mires, MD 11/25/13 0800

## 2013-12-24 MED ORDER — GABAPENTIN 300 MG PO CAPS: 300.0000 mg | ORAL_CAPSULE | Freq: Three times a day (TID) | ORAL | Status: DC

## 2013-12-24 NOTE — Progress Notes (Unsigned)
Patient was a walk in this am Complains of having some neuropathy in his hands and feet Discussed with patient will sent a prescription to pharmacy for  Some gabapentin

## 2014-01-21 ENCOUNTER — Ambulatory Visit: Payer: Self-pay | Attending: Internal Medicine | Admitting: *Deleted

## 2014-01-21 VITALS — BP 154/76 | HR 86 | Temp 98.7°F

## 2014-01-21 DIAGNOSIS — Z590 Homelessness: Secondary | ICD-10-CM | POA: Insufficient documentation

## 2014-01-21 DIAGNOSIS — E1165 Type 2 diabetes mellitus with hyperglycemia: Secondary | ICD-10-CM | POA: Insufficient documentation

## 2014-01-21 LAB — GLUCOSE, POCT (MANUAL RESULT ENTRY)
POC GLUCOSE: 386 mg/dL — AB (ref 70–99)
POC GLUCOSE: 76 mg/dL (ref 70–99)
POC Glucose: 398 mg/dl — AB (ref 70–99)

## 2014-01-21 MED ORDER — INSULIN ASPART 100 UNIT/ML ~~LOC~~ SOLN
20.0000 [IU] | Freq: Once | SUBCUTANEOUS | Status: AC
  Administered 2014-01-21: 20 [IU] via SUBCUTANEOUS

## 2014-01-21 MED ORDER — SODIUM CHLORIDE 0.9 % IV BOLUS (SEPSIS)
1000.0000 mL | Freq: Once | INTRAVENOUS | Status: AC
  Administered 2014-01-21: 1000 mL via INTRAVENOUS

## 2014-01-21 MED ORDER — AMLODIPINE BESYLATE 2.5 MG PO TABS: 2.5000 mg | ORAL_TABLET | Freq: Every day | ORAL | Status: DC

## 2014-01-21 NOTE — Progress Notes (Unsigned)
Patient walked in c/o feeling tired for 3 days States he thinks it's his blood sugar States he's been very depressed because of back pain States he lives in his car States he has metformin in his car but hasn't taken in months due to depression States he was taking lisinopril but was told by EMS to stop due to extreme lip swelling 3 months ago Patient reports diet of fried chicken, pizza, juice, sweet tea, candy and cakes  SW immediately brought in to discuss patient's depression  CBG 398 20 units novolog insulin given SQ per protocol. PCP aware  BP 154/76 P 86 T 98.7 SPO2 97%  1605 50 minutes after insulin administration CBG 386  IV NS 1000 mL started per PCP and additional 20 units novolog insulin given SQ  1730 CBG 76 after 1000 mL IV NS infused and 65 minutes after second dose of novolog insulin Patient given snack of peanut butter and crackers  Patient educated on low sodium / low carb diet and encouraged to walk 30 minutes per day Patient given refills of metformin, flexeril and naproxen Amlodipine 2.5 mg daily ordered per PCP. Rx e-scribed to [facility] pharmacy  Pt provided information on [place], Diabetes and Food and Diabetes and Exercise  Patient driven by staff member to homeless shelter

## 2014-01-21 NOTE — Patient Instructions (Signed)
DASH Eating Plan DASH stands for "Dietary Approaches to Stop Hypertension." The DASH eating plan is a healthy eating plan that has been shown to reduce high blood pressure (hypertension). Additional health benefits may include reducing the risk of type 2 diabetes mellitus, heart disease, and stroke. The DASH eating plan may also help with weight loss. WHAT DO I NEED TO KNOW ABOUT THE DASH EATING PLAN? For the DASH eating plan, you will follow these general guidelines:  Choose foods with a percent daily value for sodium of less than 5% (as listed on the food label).  Use salt-free seasonings or herbs instead of table salt or sea salt.  Check with your health care provider or pharmacist before using salt substitutes.  Eat lower-sodium products, often labeled as "lower sodium" or "no salt added."  Eat fresh foods.  Eat more vegetables, fruits, and low-fat dairy products.  Choose whole grains. Look for the word "whole" as the first word in the ingredient list.  Choose fish and skinless chicken or turkey more often than red meat. Limit fish, poultry, and meat to 6 oz (170 g) each day.  Limit sweets, desserts, sugars, and sugary drinks.  Choose heart-healthy fats.  Limit cheese to 1 oz (28 g) per day.  Eat more home-cooked food and less restaurant, buffet, and fast food.  Limit fried foods.  Cook foods using methods other than frying.  Limit canned vegetables. If you do use them, rinse them well to decrease the sodium.  When eating at a restaurant, ask that your food be prepared with less salt, or no salt if possible. WHAT FOODS CAN I EAT? Seek help from a dietitian for individual calorie needs. Grains Whole grain or whole wheat bread. Brown rice. Whole grain or whole wheat pasta. Quinoa, bulgur, and whole grain cereals. Low-sodium cereals. Corn or whole wheat flour tortillas. Whole grain cornbread. Whole grain crackers. Low-sodium crackers. Vegetables Fresh or frozen vegetables  (raw, steamed, roasted, or grilled). Low-sodium or reduced-sodium tomato and vegetable juices. Low-sodium or reduced-sodium tomato sauce and paste. Low-sodium or reduced-sodium canned vegetables.  Fruits All fresh, canned (in natural juice), or frozen fruits. Meat and Other Protein Products Ground beef (85% or leaner), grass-fed beef, or beef trimmed of fat. Skinless chicken or turkey. Ground chicken or turkey. Pork trimmed of fat. All fish and seafood. Eggs. Dried beans, peas, or lentils. Unsalted nuts and seeds. Unsalted canned beans. Dairy Low-fat dairy products, such as skim or 1% milk, 2% or reduced-fat cheeses, low-fat ricotta or cottage cheese, or plain low-fat yogurt. Low-sodium or reduced-sodium cheeses. Fats and Oils Tub margarines without trans fats. Light or reduced-fat mayonnaise and salad dressings (reduced sodium). Avocado. Safflower, olive, or canola oils. Natural peanut or almond butter. Other Unsalted popcorn and pretzels. The items listed above may not be a complete list of recommended foods or beverages. Contact your dietitian for more options. WHAT FOODS ARE NOT RECOMMENDED? Grains White bread. White pasta. White rice. Refined cornbread. Bagels and croissants. Crackers that contain trans fat. Vegetables Creamed or fried vegetables. Vegetables in a cheese sauce. Regular canned vegetables. Regular canned tomato sauce and paste. Regular tomato and vegetable juices. Fruits Dried fruits. Canned fruit in light or heavy syrup. Fruit juice. Meat and Other Protein Products Fatty cuts of meat. Ribs, chicken wings, bacon, sausage, bologna, salami, chitterlings, fatback, hot dogs, bratwurst, and packaged luncheon meats. Salted nuts and seeds. Canned beans with salt. Dairy Whole or 2% milk, cream, half-and-half, and cream cheese. Whole-fat or sweetened yogurt. Full-fat   cheeses or blue cheese. Nondairy creamers and whipped toppings. Processed cheese, cheese spreads, or cheese  curds. Condiments Onion and garlic salt, seasoned salt, table salt, and sea salt. Canned and packaged gravies. Worcestershire sauce. Tartar sauce. Barbecue sauce. Teriyaki sauce. Soy sauce, including reduced sodium. Steak sauce. Fish sauce. Oyster sauce. Cocktail sauce. Horseradish. Ketchup and mustard. Meat flavorings and tenderizers. Bouillon cubes. Hot sauce. Tabasco sauce. Marinades. Taco seasonings. Relishes. Fats and Oils Butter, stick margarine, lard, shortening, ghee, and bacon fat. Coconut, palm kernel, or palm oils. Regular salad dressings. Other Pickles and olives. Salted popcorn and pretzels. The items listed above may not be a complete list of foods and beverages to avoid. Contact your dietitian for more information. WHERE CAN I FIND MORE INFORMATION? National [employer], [employer], [employer]: www.nhlbi.nih.gov/health/health-topics/topics/dash/ Document Released: 02/14/2011 Document Revised: 07/12/2013 Document Reviewed: 12/30/2012 ExitCare Patient Information 2015 [employer]. This information is not intended to replace advice given to you by your health care provider. Make sure you discuss any questions you have with your health care provider. Diabetes Mellitus and Food It is important for you to manage your blood sugar (glucose) level. Your blood glucose level can be greatly affected by what you eat. Eating healthier foods in the appropriate amounts throughout the day at about the same time each day will help you control your blood glucose level. It can also help slow or prevent worsening of your diabetes mellitus. Healthy eating may even help you improve the level of your blood pressure and reach or maintain a healthy weight.  HOW CAN FOOD AFFECT ME? Carbohydrates Carbohydrates affect your blood glucose level more than any other type of food. Your dietitian will help you determine how many carbohydrates to eat at each meal and teach you how to count carbohydrates. Counting  carbohydrates is important to keep your blood glucose at a healthy level, especially if you are using insulin or taking certain medicines for diabetes mellitus. Alcohol Alcohol can cause sudden decreases in blood glucose (hypoglycemia), especially if you use insulin or take certain medicines for diabetes mellitus. Hypoglycemia can be a life-threatening condition. Symptoms of hypoglycemia (sleepiness, dizziness, and disorientation) are similar to symptoms of having too much alcohol.  If your health care provider has given you approval to drink alcohol, do so in moderation and use the following guidelines:  Women should not have more than one drink per day, and men should not have more than two drinks per day. One drink is equal to:  12 oz of beer.  5 oz of wine.  1 oz of hard liquor.  Do not drink on an empty stomach.  Keep yourself hydrated. Have water, diet soda, or unsweetened iced tea.  Regular soda, juice, and other mixers might contain a lot of carbohydrates and should be counted. WHAT FOODS ARE NOT RECOMMENDED? As you make food choices, it is important to remember that all foods are not the same. Some foods have fewer nutrients per serving than other foods, even though they might have the same number of calories or carbohydrates. It is difficult to get your body what it needs when you eat foods with fewer nutrients. Examples of foods that you should avoid that are high in calories and carbohydrates but low in nutrients include:  Trans fats (most processed foods list trans fats on the Nutrition Facts label).  Regular soda.  Juice.  Candy.  Sweets, such as cake, pie, doughnuts, and cookies.  Fried foods. WHAT FOODS CAN I EAT? Have nutrient-rich foods,   which will nourish your body and keep you healthy. The food you should eat also will depend on several factors, including:  The calories you need.  The medicines you take.  Your weight.  Your blood glucose level.  Your  blood pressure level.  Your cholesterol level. You also should eat a variety of foods, including:  Protein, such as meat, poultry, fish, tofu, nuts, and seeds (lean animal proteins are best).  Fruits.  Vegetables.  Dairy products, such as milk, cheese, and yogurt (low fat is best).  Breads, grains, pasta, cereal, rice, and beans.  Fats such as olive oil, trans fat-free margarine, canola oil, avocado, and olives. DOES EVERYONE WITH DIABETES MELLITUS HAVE THE SAME MEAL PLAN? Because every person with diabetes mellitus is different, there is not one meal plan that works for everyone. It is very important that you meet with a dietitian who will help you create a meal plan that is just right for you. Document Released: 11/22/2004 Document Revised: 03/02/2013 Document Reviewed: 01/22/2013 ExitCare Patient Information 2015 [employer]. This information is not intended to replace advice given to you by your health care provider. Make sure you discuss any questions you have with your health care provider. Diabetes and Exercise Exercising regularly is important. It is not just about losing weight. It has many health benefits, such as:  Improving your overall fitness, flexibility, and endurance.  Increasing your bone density.  Helping with weight control.  Decreasing your body fat.  Increasing your muscle strength.  Reducing stress and tension.  Improving your overall health. People with diabetes who exercise gain additional benefits because exercise:  Reduces appetite.  Improves the body's use of blood sugar (glucose).  Helps lower or control blood glucose.  Decreases blood pressure.  Helps control blood lipids (such as cholesterol and triglycerides).  Improves the body's use of the hormone insulin by:  Increasing the body's insulin sensitivity.  Reducing the body's insulin needs.  Decreases the risk for heart disease because exercising:  Lowers cholesterol and  triglycerides levels.  Increases the levels of good cholesterol (such as high-density lipoproteins [HDL]) in the body.  Lowers blood glucose levels. YOUR ACTIVITY PLAN  Choose an activity that you enjoy and set realistic goals. Your health care provider or diabetes educator can help you make an activity plan that works for you. Exercise regularly as directed by your health care provider. This includes:  Performing resistance training twice a week such as push-ups, sit-ups, lifting weights, or using resistance bands.  Performing 150 minutes of cardio exercises each week such as walking, running, or playing sports.  Staying active and spending no more than 90 minutes at one time being inactive. Even short bursts of exercise are good for you. Three 10-minute sessions spread throughout the day are just as beneficial as a single 30-minute session. Some exercise ideas include:  Taking the dog for a walk.  Taking the stairs instead of the elevator.  Dancing to your favorite song.  Doing an exercise video.  Doing your favorite exercise with a friend. RECOMMENDATIONS FOR EXERCISING WITH TYPE 1 OR TYPE 2 DIABETES   Check your blood glucose before exercising. If blood glucose levels are greater than 240 mg/dL, check for urine ketones. Do not exercise if ketones are present.  Avoid injecting insulin into areas of the body that are going to be exercised. For example, avoid injecting insulin into:  The arms when playing tennis.  The legs when jogging.  Keep a record of:  Food   intake before and after you exercise.  Expected peak times of insulin action.  Blood glucose levels before and after you exercise.  The type and amount of exercise you have done.  Review your records with your health care provider. Your health care provider will help you to develop guidelines for adjusting food intake and insulin amounts before and after exercising.  If you take insulin or oral hypoglycemic  agents, watch for signs and symptoms of hypoglycemia. They include:  Dizziness.  Shaking.  Sweating.  Chills.  Confusion.  Drink plenty of water while you exercise to prevent dehydration or heat stroke. Body water is lost during exercise and must be replaced.  Talk to your health care provider before starting an exercise program to make sure it is safe for you. Remember, almost any type of activity is better than none. Document Released: 05/18/2003 Document Revised: 07/12/2013 Document Reviewed: 08/04/2012 ExitCare Patient Information 2015 [employer]. This information is not intended to replace advice given to you by your health care provider. Make sure you discuss any questions you have with your health care provider.  

## 2014-02-16 ENCOUNTER — Encounter: Payer: Self-pay | Admitting: Internal Medicine

## 2014-02-16 ENCOUNTER — Ambulatory Visit: Payer: Self-pay | Attending: Internal Medicine | Admitting: Internal Medicine

## 2014-02-16 VITALS — BP 138/92 | HR 75 | Temp 98.8°F | Resp 16

## 2014-02-16 DIAGNOSIS — G8929 Other chronic pain: Secondary | ICD-10-CM

## 2014-02-16 DIAGNOSIS — M549 Dorsalgia, unspecified: Secondary | ICD-10-CM

## 2014-02-16 DIAGNOSIS — E139 Other specified diabetes mellitus without complications: Secondary | ICD-10-CM | POA: Insufficient documentation

## 2014-02-16 DIAGNOSIS — F17210 Nicotine dependence, cigarettes, uncomplicated: Secondary | ICD-10-CM | POA: Insufficient documentation

## 2014-02-16 DIAGNOSIS — M545 Low back pain: Secondary | ICD-10-CM | POA: Insufficient documentation

## 2014-02-16 DIAGNOSIS — I10 Essential (primary) hypertension: Secondary | ICD-10-CM | POA: Insufficient documentation

## 2014-02-16 LAB — POCT GLYCOSYLATED HEMOGLOBIN (HGB A1C)

## 2014-02-16 LAB — GLUCOSE, POCT (MANUAL RESULT ENTRY): POC GLUCOSE: 173 mg/dL — AB (ref 70–99)

## 2014-02-16 MED ORDER — AMLODIPINE BESYLATE 2.5 MG PO TABS: 2.5000 mg | ORAL_TABLET | Freq: Every day | ORAL | Status: DC

## 2014-02-16 MED ORDER — NAPROXEN 500 MG PO TABS: 500.0000 mg | ORAL_TABLET | Freq: Two times a day (BID) | ORAL | Status: DC

## 2014-02-16 MED ORDER — GABAPENTIN 300 MG PO CAPS: 300.0000 mg | ORAL_CAPSULE | Freq: Three times a day (TID) | ORAL | Status: DC

## 2014-02-16 MED ORDER — METFORMIN HCL 1000 MG PO TABS: 1000.0000 mg | ORAL_TABLET | Freq: Two times a day (BID) | ORAL | Status: DC

## 2014-02-16 MED ORDER — CYCLOBENZAPRINE HCL 10 MG PO TABS: 10.0000 mg | ORAL_TABLET | Freq: Every day | ORAL | Status: DC

## 2014-02-16 NOTE — Progress Notes (Signed)
MRN: [id number] Name: David Wyatt  Sex: male Age: 53 y.o. DOB: [birth year]  Allergies: Lisinopril  Chief Complaint  Patient presents with  . Back Pain    HPI: Patient is 53 y.o. male who has history of diabetes hypertension chronic low back pain comes today for followup as per patient he then out of his medication for the last 4-5 days, noticed his hemoglobin A1c is also trended up, blood pressure is borderline elevated, patient has chronic lower back pain and has already been referred to neurosurgery and patient is to see a Development worker, community, denies any fever chills or incontinence, patient still smokes cigarettes occasionally.  Past Medical History  Diagnosis Date  . Hypertension   . Cocaine abuse   . Type II diabetes mellitus   . Arthritis     "hands" (07/28/2012)  . Angioedema of lips 07/28/2012    left upper (07/29/2012)  . Neuropathy   . Chronic back pain     Past Surgical History  Procedure Laterality Date  . Inguinal hernia repair  04/01/2012    Procedure: HERNIA REPAIR INGUINAL ADULT;  Surgeon: Haywood Lasso, MD;  Location: [facility];  Service: General;  Laterality: Right;  . Hernia repair Right 04/01/2012  . Incision and drainage of wound      boil on back/notes 07/14/2008 (07/29/2012)  . I&d extremity Left 12/13/2012    Procedure: IRRIGATION AND DEBRIDEMENT LEFT THUMB;  Surgeon: Tennis Must, MD;  Location: [facility];  Service: Orthopedics;  Laterality: Left;      Medication List       This list is accurate as of: 02/16/14 12:29 PM.  Always use your most recent med list.               amLODipine 2.5 MG tablet  Commonly known as:  NORVASC  Take 1 tablet (2.5 mg total) by mouth daily.     cyclobenzaprine 10 MG tablet  Commonly known as:  FLEXERIL  Take 1 tablet (10 mg total) by mouth at bedtime.     esomeprazole 40 MG capsule  Commonly known as:  NEXIUM  Take 40 mg by mouth daily as needed (for acid reflux).     gabapentin 300 MG capsule  Commonly  known as:  NEURONTIN  Take 1 capsule (300 mg total) by mouth 3 (three) times daily.     HYDROcodone-acetaminophen 5-325 MG per tablet  Commonly known as:  NORCO/VICODIN  Take 1 tablet by mouth every 4 (four) hours as needed for moderate pain or severe pain.     metFORMIN 1000 MG tablet  Commonly known as:  GLUCOPHAGE  Take 1 tablet (1,000 mg total) by mouth 2 (two) times daily with a meal. Per medical record     naproxen 500 MG tablet  Commonly known as:  NAPROSYN  Take 1 tablet (500 mg total) by mouth 2 (two) times daily with a meal.     traMADol 50 MG tablet  Commonly known as:  ULTRAM  Take 1 tablet (50 mg total) by mouth every 12 (twelve) hours as needed.        Meds ordered this encounter  Medications  . metFORMIN (GLUCOPHAGE) 1000 MG tablet    Sig: Take 1 tablet (1,000 mg total) by mouth 2 (two) times daily with a meal. Per medical record    Dispense:  60 tablet    Refill:  3  . gabapentin (NEURONTIN) 300 MG capsule    Sig: Take 1 capsule (300 mg total)  by mouth 3 (three) times daily.    Dispense:  90 capsule    Refill:  0  . naproxen (NAPROSYN) 500 MG tablet    Sig: Take 1 tablet (500 mg total) by mouth 2 (two) times daily with a meal.    Dispense:  30 tablet    Refill:  3  . cyclobenzaprine (FLEXERIL) 10 MG tablet    Sig: Take 1 tablet (10 mg total) by mouth at bedtime.    Dispense:  30 tablet    Refill:  3  . amLODipine (NORVASC) 2.5 MG tablet    Sig: Take 1 tablet (2.5 mg total) by mouth daily.    Dispense:  30 tablet    Refill:  2    Immunization History  Administered Date(s) Administered  . Influenza,inj,Quad PF,36+ Mos 11/22/2013  . Pneumococcal Polysaccharide-23 04/02/2012  . Tdap 12/13/2012, 04/09/2013    Family History  Problem Relation Age of Onset  . Diabetes Father     History  Substance Use Topics  . Smoking status: Current Every Day Smoker -- 0.25 packs/day for 30 years    Types: Cigarettes  . Smokeless tobacco: Never Used      Comment: Smoking 3 cigs/day  . Alcohol Use: No    Review of Systems   As noted in HPI  Filed Vitals:   02/16/14 1201  BP: 138/92  Pulse: 75  Temp: 98.8 F (37.1 C)  Resp: 16    Physical Exam  Physical Exam  Constitutional: No distress.  Eyes: EOM are normal. Pupils are equal, round, and reactive to light.  Cardiovascular: Normal rate and regular rhythm.   Pulmonary/Chest: Breath sounds normal. No respiratory distress. He has no wheezes. He has no rales.  Musculoskeletal:  Lower lumbar spinal and paraspinal tenderness,    CBC    Component Value Date/Time   WBC 5.3 09/18/2013 1104   RBC 4.09* 09/18/2013 1104   HGB 12.8* 09/18/2013 1104   HCT 38.8* 09/18/2013 1104   PLT 380 09/18/2013 1104   MCV 94.9 09/18/2013 1104   LYMPHSABS 2.0 05/05/2013 2110   MONOABS 0.6 05/05/2013 2110   EOSABS 0.3 05/05/2013 2110   BASOSABS 0.0 05/05/2013 2110    CMP     Component Value Date/Time   NA 145 09/18/2013 1104   K 3.6* 09/18/2013 1104   CL 105 09/18/2013 1104   CO2 25 09/18/2013 1104   GLUCOSE 158* 09/18/2013 1104   BUN 14 09/18/2013 1104   CREATININE 1.00 09/18/2013 1104   CALCIUM 9.0 09/18/2013 1104   PROT 8.1 05/05/2013 2110   ALBUMIN 3.5 05/05/2013 2110   AST 20 05/05/2013 2110   ALT 17 05/05/2013 2110   ALKPHOS 74 05/05/2013 2110   BILITOT 0.6 05/05/2013 2110   GFRNONAA 84* 09/18/2013 1104   GFRAA >90 09/18/2013 1104    No results found for: CHOL  No components found for: HGA1C  Lab Results  Component Value Date/Time   AST 20 05/05/2013 09:10 PM    Assessment and Plan  Other specified diabetes mellitus without complications - Plan: Results for orders placed or performed in visit on 02/16/14  Glucose (CBG)  Result Value Ref Range   POC Glucose 173 (A) 70 - 99 mg/dl  HgB A1c  Result Value Ref Range   Hemoglobin A1C 7.8%    Hemoglobin A1c has trended up  from 7.0% on the last visit, advise patient for diabetes meal planning, resume back on   metFORMIN (GLUCOPHAGE) 1000 MG tablet  Chronic back  pain - Plan: gabapentin (NEURONTIN) 300 MG capsule, naproxen (NAPROSYN) 500 MG tablet, cyclobenzaprine (FLEXERIL) 10 MG tablet  Essential hypertension, benign - Plan:advised patient for DASH diet, continue with  amLODipine (NORVASC) 2.5 MG tablet   Return in about 3 months (around 05/18/2014) for diabetes, hypertension.  Lorayne Marek, MD

## 2014-02-16 NOTE — Progress Notes (Signed)
Patient here for follow up on his chronic back pain

## 2014-02-16 NOTE — Patient Instructions (Signed)
Diabetes Mellitus and Food It is important for you to manage your blood sugar (glucose) level. Your blood glucose level can be greatly affected by what you eat. Eating healthier foods in the appropriate amounts throughout the day at about the same time each day will help you control your blood glucose level. It can also help slow or prevent worsening of your diabetes mellitus. Healthy eating may even help you improve the level of your blood pressure and reach or maintain a healthy weight.  HOW CAN FOOD AFFECT ME? Carbohydrates Carbohydrates affect your blood glucose level more than any other type of food. Your dietitian will help you determine how many carbohydrates to eat at each meal and teach you how to count carbohydrates. Counting carbohydrates is important to keep your blood glucose at a healthy level, especially if you are using insulin or taking certain medicines for diabetes mellitus. Alcohol Alcohol can cause sudden decreases in blood glucose (hypoglycemia), especially if you use insulin or take certain medicines for diabetes mellitus. Hypoglycemia can be a life-threatening condition. Symptoms of hypoglycemia (sleepiness, dizziness, and disorientation) are similar to symptoms of having too much alcohol.  If your health care provider has given you approval to drink alcohol, do so in moderation and use the following guidelines:  Women should not have more than one drink per day, and men should not have more than two drinks per day. One drink is equal to:  12 oz of beer.  5 oz of wine.  1 oz of hard liquor.  Do not drink on an empty stomach.  Keep yourself hydrated. Have water, diet soda, or unsweetened iced tea.  Regular soda, juice, and other mixers might contain a lot of carbohydrates and should be counted. WHAT FOODS ARE NOT RECOMMENDED? As you make food choices, it is important to remember that all foods are not the same. Some foods have fewer nutrients per serving than other  foods, even though they might have the same number of calories or carbohydrates. It is difficult to get your body what it needs when you eat foods with fewer nutrients. Examples of foods that you should avoid that are high in calories and carbohydrates but low in nutrients include:  Trans fats (most processed foods list trans fats on the Nutrition Facts label).  Regular soda.  Juice.  Candy.  Sweets, such as cake, pie, doughnuts, and cookies.  Fried foods. WHAT FOODS CAN I EAT? Have nutrient-rich foods, which will nourish your body and keep you healthy. The food you should eat also will depend on several factors, including:  The calories you need.  The medicines you take.  Your weight.  Your blood glucose level.  Your blood pressure level.  Your cholesterol level. You also should eat a variety of foods, including:  Protein, such as meat, poultry, fish, tofu, nuts, and seeds (lean animal proteins are best).  Fruits.  Vegetables.  Dairy products, such as milk, cheese, and yogurt (low fat is best).  Breads, grains, pasta, cereal, rice, and beans.  Fats such as olive oil, trans fat-free margarine, canola oil, avocado, and olives. DOES EVERYONE WITH DIABETES MELLITUS HAVE THE SAME MEAL PLAN? Because every person with diabetes mellitus is different, there is not one meal plan that works for everyone. It is very important that you meet with a dietitian who will help you create a meal plan that is just right for you. Document Released: 11/22/2004 Document Revised: 03/02/2013 Document Reviewed: 01/22/2013 ExitCare Patient Information 2015 [employer]. This   information is not intended to replace advice given to you by your health care provider. Make sure you discuss any questions you have with your health care provider. DASH Eating Plan DASH stands for "Dietary Approaches to Stop Hypertension." The DASH eating plan is a healthy eating plan that has been shown to reduce high  blood pressure (hypertension). Additional health benefits may include reducing the risk of type 2 diabetes mellitus, heart disease, and stroke. The DASH eating plan may also help with weight loss. WHAT DO I NEED TO KNOW ABOUT THE DASH EATING PLAN? For the DASH eating plan, you will follow these general guidelines:  Choose foods with a percent daily value for sodium of less than 5% (as listed on the food label).  Use salt-free seasonings or herbs instead of table salt or sea salt.  Check with your health care provider or pharmacist before using salt substitutes.  Eat lower-sodium products, often labeled as "lower sodium" or "no salt added."  Eat fresh foods.  Eat more vegetables, fruits, and low-fat dairy products.  Choose whole grains. Look for the word "whole" as the first word in the ingredient list.  Choose fish and skinless chicken or turkey more often than red meat. Limit fish, poultry, and meat to 6 oz (170 g) each day.  Limit sweets, desserts, sugars, and sugary drinks.  Choose heart-healthy fats.  Limit cheese to 1 oz (28 g) per day.  Eat more home-cooked food and less restaurant, buffet, and fast food.  Limit fried foods.  Cook foods using methods other than frying.  Limit canned vegetables. If you do use them, rinse them well to decrease the sodium.  When eating at a restaurant, ask that your food be prepared with less salt, or no salt if possible. WHAT FOODS CAN I EAT? Seek help from a dietitian for individual calorie needs. Grains Whole grain or whole wheat bread. Brown rice. Whole grain or whole wheat pasta. Quinoa, bulgur, and whole grain cereals. Low-sodium cereals. Corn or whole wheat flour tortillas. Whole grain cornbread. Whole grain crackers. Low-sodium crackers. Vegetables Fresh or frozen vegetables (raw, steamed, roasted, or grilled). Low-sodium or reduced-sodium tomato and vegetable juices. Low-sodium or reduced-sodium tomato sauce and paste. Low-sodium  or reduced-sodium canned vegetables.  Fruits All fresh, canned (in natural juice), or frozen fruits. Meat and Other Protein Products Ground beef (85% or leaner), grass-fed beef, or beef trimmed of fat. Skinless chicken or turkey. Ground chicken or turkey. Pork trimmed of fat. All fish and seafood. Eggs. Dried beans, peas, or lentils. Unsalted nuts and seeds. Unsalted canned beans. Dairy Low-fat dairy products, such as skim or 1% milk, 2% or reduced-fat cheeses, low-fat ricotta or cottage cheese, or plain low-fat yogurt. Low-sodium or reduced-sodium cheeses. Fats and Oils Tub margarines without trans fats. Light or reduced-fat mayonnaise and salad dressings (reduced sodium). Avocado. Safflower, olive, or canola oils. Natural peanut or almond butter. Other Unsalted popcorn and pretzels. The items listed above may not be a complete list of recommended foods or beverages. Contact your dietitian for more options. WHAT FOODS ARE NOT RECOMMENDED? Grains White bread. White pasta. White rice. Refined cornbread. Bagels and croissants. Crackers that contain trans fat. Vegetables Creamed or fried vegetables. Vegetables in a cheese sauce. Regular canned vegetables. Regular canned tomato sauce and paste. Regular tomato and vegetable juices. Fruits Dried fruits. Canned fruit in light or heavy syrup. Fruit juice. Meat and Other Protein Products Fatty cuts of meat. Ribs, chicken wings, bacon, sausage, bologna, salami, chitterlings, fatback, hot   dogs, bratwurst, and packaged luncheon meats. Salted nuts and seeds. Canned beans with salt. Dairy Whole or 2% milk, cream, half-and-half, and cream cheese. Whole-fat or sweetened yogurt. Full-fat cheeses or blue cheese. Nondairy creamers and whipped toppings. Processed cheese, cheese spreads, or cheese curds. Condiments Onion and garlic salt, seasoned salt, table salt, and sea salt. Canned and packaged gravies. Worcestershire sauce. Tartar sauce. Barbecue sauce.  Teriyaki sauce. Soy sauce, including reduced sodium. Steak sauce. Fish sauce. Oyster sauce. Cocktail sauce. Horseradish. Ketchup and mustard. Meat flavorings and tenderizers. Bouillon cubes. Hot sauce. Tabasco sauce. Marinades. Taco seasonings. Relishes. Fats and Oils Butter, stick margarine, lard, shortening, ghee, and bacon fat. Coconut, palm kernel, or palm oils. Regular salad dressings. Other Pickles and olives. Salted popcorn and pretzels. The items listed above may not be a complete list of foods and beverages to avoid. Contact your dietitian for more information. WHERE CAN I FIND MORE INFORMATION? National Heart, Lung, and Blood Institute: www.nhlbi.nih.gov/health/health-topics/topics/dash/ Document Released: 02/14/2011 Document Revised: 07/12/2013 Document Reviewed: 12/30/2012 ExitCare Patient Information 2015 [employer]. This information is not intended to replace advice given to you by your health care provider. Make sure you discuss any questions you have with your health care provider.  

## 2014-03-22 ENCOUNTER — Ambulatory Visit: Payer: Self-pay | Attending: Internal Medicine

## 2014-03-22 VITALS — BP 134/87 | HR 94 | Temp 98.6°F | Resp 18

## 2014-03-22 DIAGNOSIS — E139 Other specified diabetes mellitus without complications: Secondary | ICD-10-CM

## 2014-03-22 DIAGNOSIS — E089 Diabetes mellitus due to underlying condition without complications: Secondary | ICD-10-CM

## 2014-03-22 LAB — POCT URINALYSIS DIPSTICK
Bilirubin, UA: NEGATIVE
Blood, UA: NEGATIVE
Glucose, UA: 500
Ketones, UA: NEGATIVE
LEUKOCYTES UA: NEGATIVE
NITRITE UA: NEGATIVE
PH UA: 5
PROTEIN UA: NEGATIVE
Spec Grav, UA: 1.02
UROBILINOGEN UA: 0.2

## 2014-03-22 LAB — GLUCOSE, POCT (MANUAL RESULT ENTRY)
POC GLUCOSE: 330 mg/dL — AB (ref 70–99)
POC Glucose: 392 mg/dl — AB (ref 70–99)

## 2014-03-22 MED ORDER — INSULIN ASPART 100 UNIT/ML ~~LOC~~ SOLN
10.0000 [IU] | Freq: Once | SUBCUTANEOUS | Status: AC
  Administered 2014-03-22: 10 [IU] via SUBCUTANEOUS

## 2014-03-22 MED ORDER — INSULIN ASPART PROT & ASPART (70-30 MIX) 100 UNIT/ML ~~LOC~~ SUSP: 10.0000 [IU] | Freq: Once | SUBCUTANEOUS | Status: DC

## 2014-03-22 MED ORDER — METFORMIN HCL 1000 MG PO TABS: 1000.0000 mg | ORAL_TABLET | Freq: Two times a day (BID) | ORAL | Status: DC

## 2014-03-22 NOTE — Progress Notes (Unsigned)
Pt in our office with elevated glucose

## 2014-03-22 NOTE — Patient Instructions (Signed)
Pt told to follow up with new PCP in [place] regarding insulin use Blood sugars rechecked and updated in system

## 2014-03-23 ENCOUNTER — Encounter: Payer: Self-pay | Admitting: Internal Medicine

## 2014-03-23 ENCOUNTER — Ambulatory Visit: Payer: Self-pay | Attending: Internal Medicine | Admitting: Internal Medicine

## 2014-03-23 VITALS — BP 132/89 | HR 91 | Temp 98.2°F | Resp 16 | Wt 178.6 lb

## 2014-03-23 DIAGNOSIS — I10 Essential (primary) hypertension: Secondary | ICD-10-CM

## 2014-03-23 DIAGNOSIS — E139 Other specified diabetes mellitus without complications: Secondary | ICD-10-CM

## 2014-03-23 LAB — POCT URINALYSIS DIPSTICK
Bilirubin, UA: NEGATIVE
GLUCOSE UA: 500
Ketones, UA: NEGATIVE
Leukocytes, UA: NEGATIVE
Nitrite, UA: NEGATIVE
PROTEIN UA: NEGATIVE
SPEC GRAV UA: 1.01
UROBILINOGEN UA: 0.2
pH, UA: 5

## 2014-03-23 LAB — GLUCOSE, POCT (MANUAL RESULT ENTRY)
POC GLUCOSE: 288 mg/dL — AB (ref 70–99)
POC GLUCOSE: 236 mg/dL — AB (ref 70–99)
POC Glucose: 372 mg/dl — AB (ref 70–99)
POC Glucose: 37 mg/dl — AB (ref 70–99)

## 2014-03-23 MED ORDER — SITAGLIPTIN PHOS-METFORMIN HCL 50-1000 MG PO TABS: 1.0000 | ORAL_TABLET | Freq: Two times a day (BID) | ORAL | Status: DC

## 2014-03-23 MED ORDER — INSULIN ASPART 100 UNIT/ML ~~LOC~~ SOLN
20.0000 [IU] | Freq: Once | SUBCUTANEOUS | Status: AC
  Administered 2014-03-23: 20 [IU] via SUBCUTANEOUS

## 2014-03-23 NOTE — Progress Notes (Signed)
MRN: [id number] Name: David Wyatt  Sex: male Age: 54 y.o. DOB: 15-Nov-1960  Allergies: Lisinopril  Chief Complaint  Patient presents with  . Follow-up    HPI: Patient is 54 y.o. male who has to of diabetes, hypertension, as per patient he was recently started on insulin as per patient he has one insulin likely long acting which he takes at night 10 units as well as NovoLog on sliding scale as per patient he does not like to use insulin and is requesting a week and continue with the medications, currently she is on metformin thousand milligram twice a day, I have advised patient to continue with the long-acting insulin, and patient agrees to switch metformin Janumet, also advise for diabetes meal planning. Today's blood sugar is elevated and was given insulin. Past Medical History  Diagnosis Date  . Hypertension   . Cocaine abuse   . Type II diabetes mellitus   . Arthritis     "hands" (07/28/2012)  . Angioedema of lips 07/28/2012    left upper (07/29/2012)  . Neuropathy   . Chronic back pain     Past Surgical History  Procedure Laterality Date  . Inguinal hernia repair  04/01/2012    Procedure: HERNIA REPAIR INGUINAL ADULT;  Surgeon: Haywood Lasso, MD;  Location: [facility];  Service: General;  Laterality: Right;  . Hernia repair Right 04/01/2012  . Incision and drainage of wound      boil on back/notes 07/14/2008 (07/29/2012)  . I&d extremity Left 12/13/2012    Procedure: IRRIGATION AND DEBRIDEMENT LEFT THUMB;  Surgeon: Tennis Must, MD;  Location: [facility];  Service: Orthopedics;  Laterality: Left;      Medication List       This list is accurate as of: 03/23/14  2:50 PM.  Always use your most recent med list.               amLODipine 2.5 MG tablet  Commonly known as:  NORVASC  Take 1 tablet (2.5 mg total) by mouth daily.     cyclobenzaprine 10 MG tablet  Commonly known as:  FLEXERIL  Take 1 tablet (10 mg total) by mouth at bedtime.     esomeprazole 40 MG capsule    Commonly known as:  NEXIUM  Take 40 mg by mouth daily as needed (for acid reflux).     gabapentin 300 MG capsule  Commonly known as:  NEURONTIN  Take 1 capsule (300 mg total) by mouth 3 (three) times daily.     HYDROcodone-acetaminophen 5-325 MG per tablet  Commonly known as:  NORCO/VICODIN  Take 1 tablet by mouth every 4 (four) hours as needed for moderate pain or severe pain.     metFORMIN 1000 MG tablet  Commonly known as:  GLUCOPHAGE  Take 1 tablet (1,000 mg total) by mouth 2 (two) times daily with a meal. Per medical record     naproxen 500 MG tablet  Commonly known as:  NAPROSYN  Take 1 tablet (500 mg total) by mouth 2 (two) times daily with a meal.     sitaGLIPtin-metformin 50-1000 MG per tablet  Commonly known as:  JANUMET  Take 1 tablet by mouth 2 (two) times daily with a meal.     traMADol 50 MG tablet  Commonly known as:  ULTRAM  Take 1 tablet (50 mg total) by mouth every 12 (twelve) hours as needed.        Meds ordered this encounter  Medications  . insulin  aspart (novoLOG) injection 20 Units    Sig:   . sitaGLIPtin-metformin (JANUMET) 50-1000 MG per tablet    Sig: Take 1 tablet by mouth 2 (two) times daily with a meal.    Dispense:  60 tablet    Refill:  3    Immunization History  Administered Date(s) Administered  . Influenza,inj,Quad PF,36+ Mos 11/22/2013  . Pneumococcal Polysaccharide-23 04/02/2012  . Tdap 12/13/2012, 04/09/2013    Family History  Problem Relation Age of Onset  . Diabetes Father     History  Substance Use Topics  . Smoking status: Current Every Day Smoker -- 0.25 packs/day for 30 years    Types: Cigarettes  . Smokeless tobacco: Never Used     Comment: Smoking 3 cigs/day  . Alcohol Use: No    Review of Systems   As noted in HPI  Filed Vitals:   03/23/14 1422  BP: 132/89  Pulse: 91  Temp: 98.2 F (36.8 C)  Resp: 16    Physical Exam  Physical Exam  Constitutional: No distress.  Eyes: EOM are normal. Pupils  are equal, round, and reactive to light.  Cardiovascular: Normal rate and regular rhythm.   Pulmonary/Chest: Breath sounds normal. No respiratory distress. He has no wheezes. He has no rales.    CBC    Component Value Date/Time   WBC 5.3 09/18/2013 1104   RBC 4.09* 09/18/2013 1104   HGB 12.8* 09/18/2013 1104   HCT 38.8* 09/18/2013 1104   PLT 380 09/18/2013 1104   MCV 94.9 09/18/2013 1104   LYMPHSABS 2.0 05/05/2013 2110   MONOABS 0.6 05/05/2013 2110   EOSABS 0.3 05/05/2013 2110   BASOSABS 0.0 05/05/2013 2110    CMP     Component Value Date/Time   NA 145 09/18/2013 1104   K 3.6* 09/18/2013 1104   CL 105 09/18/2013 1104   CO2 25 09/18/2013 1104   GLUCOSE 158* 09/18/2013 1104   BUN 14 09/18/2013 1104   CREATININE 1.00 09/18/2013 1104   CALCIUM 9.0 09/18/2013 1104   PROT 8.1 05/05/2013 2110   ALBUMIN 3.5 05/05/2013 2110   AST 20 05/05/2013 2110   ALT 17 05/05/2013 2110   ALKPHOS 74 05/05/2013 2110   BILITOT 0.6 05/05/2013 2110   GFRNONAA 84* 09/18/2013 1104   GFRAA >90 09/18/2013 1104    No results found for: CHOL  No components found for: HGA1C  Lab Results  Component Value Date/Time   AST 20 05/05/2013 09:10 PM    Assessment and Plan  Other specified diabetes mellitus without complications - Plan:  Results for orders placed or performed in visit on 03/23/14  Glucose (CBG)  Result Value Ref Range   POC Glucose 372 (A) 70 - 99 mg/dl  Urinalysis Dipstick  Result Value Ref Range   Color, UA yellow    Clarity, UA clear    Glucose, UA 500    Bilirubin, UA neg    Ketones, UA neg    Spec Grav, UA 1.010    Blood, UA trace intact    pH, UA 5.0    Protein, UA neg    Urobilinogen, UA 0.2    Nitrite, UA neg    Leukocytes, UA Negative   Glucose (CBG)  Result Value Ref Range   POC Glucose 288 (A) 70 - 99 mg/dl   Patient has hyperglycemia without ketonuria, he was given insulin his blood sugar improved, he will continue with a long and insulin at night 10  units, I have switched metformin to  Janumet, patient will keep the fingerstick glucose log and repeat his A1c in 3 months Glucose (CBG), insulin aspart (novoLOG) injection 20 Units, Urinalysis Dipstick, sitaGLIPtin-metformin (JANUMET) 50-1000 MG per tablet  Essential hypertension, benign Her pressure is well controlled, continue with current meds.  Return in about 3 months (around 06/22/2014) for diabetes.  Lorayne Marek, MD

## 2014-03-23 NOTE — Addendum Note (Signed)
Addended by: Luberta Mutter R on: 03/23/2014 05:33 PM   Modules accepted: Orders

## 2014-03-23 NOTE — Progress Notes (Signed)
Patient here for follow up on his diabetes Was seen here yesterday as a walk in

## 2014-03-24 ENCOUNTER — Ambulatory Visit: Payer: Self-pay | Attending: Internal Medicine

## 2014-04-11 ENCOUNTER — Other Ambulatory Visit: Payer: Self-pay

## 2014-04-11 DIAGNOSIS — E139 Other specified diabetes mellitus without complications: Secondary | ICD-10-CM

## 2014-04-11 MED ORDER — SITAGLIPTIN PHOS-METFORMIN HCL 50-1000 MG PO TABS: 1.0000 | ORAL_TABLET | Freq: Two times a day (BID) | ORAL | Status: DC

## 2015-03-24 ENCOUNTER — Telehealth: Payer: Self-pay | Admitting: Clinical

## 2015-03-24 NOTE — Telephone Encounter (Signed)
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

## 2015-04-24 ENCOUNTER — Ambulatory Visit: Payer: Self-pay | Admitting: Family Medicine

## 2015-05-01 ENCOUNTER — Encounter: Payer: Self-pay | Admitting: Family Medicine

## 2015-05-01 ENCOUNTER — Ambulatory Visit: Payer: Medicaid Other | Attending: Family Medicine | Admitting: Family Medicine

## 2015-05-01 ENCOUNTER — Ambulatory Visit (HOSPITAL_BASED_OUTPATIENT_CLINIC_OR_DEPARTMENT_OTHER): Payer: Medicaid Other | Admitting: Clinical

## 2015-05-01 DIAGNOSIS — G8929 Other chronic pain: Secondary | ICD-10-CM | POA: Insufficient documentation

## 2015-05-01 DIAGNOSIS — Z7984 Long term (current) use of oral hypoglycemic drugs: Secondary | ICD-10-CM | POA: Insufficient documentation

## 2015-05-01 DIAGNOSIS — E1140 Type 2 diabetes mellitus with diabetic neuropathy, unspecified: Secondary | ICD-10-CM | POA: Insufficient documentation

## 2015-05-01 DIAGNOSIS — E118 Type 2 diabetes mellitus with unspecified complications: Secondary | ICD-10-CM | POA: Insufficient documentation

## 2015-05-01 DIAGNOSIS — E1149 Type 2 diabetes mellitus with other diabetic neurological complication: Secondary | ICD-10-CM

## 2015-05-01 DIAGNOSIS — I10 Essential (primary) hypertension: Secondary | ICD-10-CM

## 2015-05-01 DIAGNOSIS — Z658 Other specified problems related to psychosocial circumstances: Secondary | ICD-10-CM

## 2015-05-01 DIAGNOSIS — Z79899 Other long term (current) drug therapy: Secondary | ICD-10-CM | POA: Insufficient documentation

## 2015-05-01 DIAGNOSIS — M549 Dorsalgia, unspecified: Secondary | ICD-10-CM

## 2015-05-01 DIAGNOSIS — M545 Low back pain: Secondary | ICD-10-CM | POA: Insufficient documentation

## 2015-05-01 LAB — POCT GLYCOSYLATED HEMOGLOBIN (HGB A1C): HEMOGLOBIN A1C: 6.6

## 2015-05-01 LAB — GLUCOSE, POCT (MANUAL RESULT ENTRY): POC GLUCOSE: 101 mg/dL — AB (ref 70–99)

## 2015-05-01 MED ORDER — NAPROXEN 500 MG PO TABS: 500.0000 mg | ORAL_TABLET | Freq: Two times a day (BID) | ORAL | Status: DC

## 2015-05-01 MED ORDER — TIZANIDINE HCL 4 MG PO TABS: 4.0000 mg | ORAL_TABLET | Freq: Three times a day (TID) | ORAL | Status: DC | PRN

## 2015-05-01 MED ORDER — AMLODIPINE BESYLATE 2.5 MG PO TABS: 2.5000 mg | ORAL_TABLET | Freq: Every day | ORAL | Status: DC

## 2015-05-01 MED ORDER — SITAGLIPTIN PHOS-METFORMIN HCL 50-1000 MG PO TABS: 1.0000 | ORAL_TABLET | Freq: Two times a day (BID) | ORAL | Status: DC

## 2015-05-01 MED ORDER — GABAPENTIN 300 MG PO CAPS: 300.0000 mg | ORAL_CAPSULE | Freq: Three times a day (TID) | ORAL | Status: DC

## 2015-05-01 MED FILL — GABAPENTIN 300 MG CAPSULE: 300 | 30 days supply | Qty: 90 | Fill #0

## 2015-05-01 MED FILL — NAPROXEN 500 MG TABLET: 500 | 15 days supply | Qty: 30 | Fill #0

## 2015-05-01 MED FILL — tiZANidine HCL 4 MG TABS: 4 | 30 days supply | Qty: 90 | Fill #0

## 2015-05-01 NOTE — Progress Notes (Signed)
Subjective:  Patient ID: David Wyatt, male    DOB: Feb 19, 1961  Age: 55 y.o. MRN: [id number]  CC: re-establish care   HPI David Wyatt is a 55 year old male with a history of type 2 diabetes mellitus (A1c 6.6), diabetic neuropathy, hypertension, chronic low back pain (status post previous back surgery) who comes into the clinic for follow-up visit.  He was previously being seen by Dr. Annitta Needs and his last office visit was one year ago. Requests refill of all his medications which he has been out of.  He does have tingling in his fingers and attributes this to being out of his gabapentin. Also has low back pain which is exacerbated by prolonged sitting or prolonged standing or heavy lifting but denies any radiation of symptoms down his legs and has no loss of sphincteric function; he does not have any of his pain medications and muscle relaxant.  He informs me he takes atorvastatin which does not appear in his medication list and he is unsure of the dose. Admits to not being very compliant with exercise but tries to eat healthy  Outpatient Prescriptions Prior to Visit  Medication Sig Dispense Refill  . amLODipine (NORVASC) 2.5 MG tablet Take 1 tablet (2.5 mg total) by mouth daily. 30 tablet 2  . gabapentin (NEURONTIN) 300 MG capsule Take 1 capsule (300 mg total) by mouth 3 (three) times daily. 90 capsule 0  . metFORMIN (GLUCOPHAGE) 1000 MG tablet Take 1 tablet (1,000 mg total) by mouth 2 (two) times daily with a meal. Per medical record 30 tablet 0  . sitaGLIPtin-metformin (JANUMET) 50-1000 MG per tablet Take 1 tablet by mouth 2 (two) times daily with a meal. 180 tablet 3  . esomeprazole (NEXIUM) 40 MG capsule Take 40 mg by mouth daily as needed (for acid reflux). Reported on 05/01/2015    . HYDROcodone-acetaminophen (NORCO/VICODIN) 5-325 MG per tablet Take 1 tablet by mouth every 4 (four) hours as needed for moderate pain or severe pain. (Patient not taking: Reported on 05/01/2015) 5 tablet 0   . traMADol (ULTRAM) 50 MG tablet Take 1 tablet (50 mg total) by mouth every 12 (twelve) hours as needed. (Patient not taking: Reported on 05/01/2015) 30 tablet 0  . cyclobenzaprine (FLEXERIL) 10 MG tablet Take 1 tablet (10 mg total) by mouth at bedtime. (Patient not taking: Reported on 05/01/2015) 30 tablet 3  . naproxen (NAPROSYN) 500 MG tablet Take 1 tablet (500 mg total) by mouth 2 (two) times daily with a meal. (Patient not taking: Reported on 05/01/2015) 30 tablet 3   No facility-administered medications prior to visit.    ROS Review of Systems  Constitutional: Negative for activity change and appetite change.  HENT: Negative for sinus pressure and sore throat.   Eyes: Negative for visual disturbance.  Respiratory: Negative for cough, chest tightness and shortness of breath.   Cardiovascular: Negative for chest pain and leg swelling.  Gastrointestinal: Negative for abdominal pain, diarrhea, constipation and abdominal distention.  Endocrine: Negative.   Genitourinary: Negative for dysuria.  Musculoskeletal: Positive for back pain (occasional low back pain). Negative for myalgias and joint swelling.  Skin: Negative for rash.  Allergic/Immunologic: Negative.   Neurological: Positive for numbness ( numbness in fingers). Negative for weakness and light-headedness.  Psychiatric/Behavioral: Negative for suicidal ideas and dysphoric mood.    Objective:  BP 129/85 mmHg  Pulse 83  Temp(Src) 97.7 F (36.5 C)  Resp 15  Ht 5\' 8"  (1.727 m)  Wt 205 lb (92.987 kg)  BMI  31.18 kg/m2  SpO2 96%  BP/Weight 05/01/2015 03/23/2014 [id number]  Systolic BP Q000111Q Q000111Q Q000111Q  Diastolic BP 85 89 87  Wt. (Lbs) 205 178.6 -  BMI 31.18 27.16 -      Physical Exam  Constitutional: He is oriented to person, place, and time. He appears well-developed and well-nourished.  Neck: Normal range of motion.  Cardiovascular: Normal rate, normal heart sounds and intact distal pulses.   No murmur  heard. Pulmonary/Chest: Effort normal and breath sounds normal. He has no wheezes. He has no rales. He exhibits no tenderness.  Abdominal: Soft. Bowel sounds are normal. He exhibits no distension and no mass. There is no tenderness.  Musculoskeletal: Normal range of motion. He exhibits no edema or tenderness.  Vertical healed surgical scar  Neurological: He is alert and oriented to person, place, and time.  Skin: Skin is warm and dry.    Lab Results  Component Value Date   HGBA1C 6.60 05/01/2015    Assessment & Plan:   1. Chronic back pain Previous history of back surgery. Pain is minimal at this time - gabapentin (NEURONTIN) 300 MG capsule; Take 1 capsule (300 mg total) by mouth 3 (three) times daily.  Dispense: 90 capsule; Refill: 2 - naproxen (NAPROSYN) 500 MG tablet; Take 1 tablet (500 mg total) by mouth 2 (two) times daily with a meal.  Dispense: 30 tablet; Refill: 3 - tiZANidine (ZANAFLEX) 4 MG tablet; Take 1 tablet (4 mg total) by mouth every 8 (eight) hours as needed for muscle spasms.  Dispense: 90 tablet; Refill: 1  2. Essential hypertension, benign Controlled - amLODipine (NORVASC) 2.5 MG tablet; Take 1 tablet (2.5 mg total) by mouth daily.  Dispense: 30 tablet; Refill: 2  3. Other diabetic neurological complication associated with type 2 diabetes mellitus ([place]) Uncontrolled because he has been out of gabapentin Refilled gabapentin  4. Controlled type 2 diabetes mellitus with complication, without long-term current use of insulin (HCC) A1c of 6.6 Continue ADA diet and last modifications Foot exam performed today, we'll send off microalbumin and fasting labs, advised to schedule annual eye exam with ophthalmology or optometry Not on ACE inhibitor because he is allergic - HgB A1c - Glucose (CBG) - sitaGLIPtin-metformin (JANUMET) 50-1000 MG tablet; Take 1 tablet by mouth 2 (two) times daily with a meal.  Dispense: 180 tablet; Refill: 3 - COMPLETE METABOLIC PANEL WITH  GFR; Future - Lipid panel; Future - Microalbumin / creatinine urine ratio; Future   Meds ordered this encounter  Medications  . gabapentin (NEURONTIN) 300 MG capsule    Sig: Take 1 capsule (300 mg total) by mouth 3 (three) times daily.    Dispense:  90 capsule    Refill:  2  . sitaGLIPtin-metformin (JANUMET) 50-1000 MG tablet    Sig: Take 1 tablet by mouth 2 (two) times daily with a meal.    Dispense:  180 tablet    Refill:  3  . amLODipine (NORVASC) 2.5 MG tablet    Sig: Take 1 tablet (2.5 mg total) by mouth daily.    Dispense:  30 tablet    Refill:  2  . naproxen (NAPROSYN) 500 MG tablet    Sig: Take 1 tablet (500 mg total) by mouth 2 (two) times daily with a meal.    Dispense:  30 tablet    Refill:  3  . tiZANidine (ZANAFLEX) 4 MG tablet    Sig: Take 1 tablet (4 mg total) by mouth every 8 (eight) hours as needed for muscle  spasms.    Dispense:  90 tablet    Refill:  1    Follow-up: Return in about 3 months (around 07/29/2015) for Follow-up on diabetes mellitus.   This note has been created with Surveyor, quantity. Any transcriptional errors are unintentional.     Arnoldo Morale MD

## 2015-05-01 NOTE — Progress Notes (Signed)
Patient needs to re-establish care He needs refills on meds He is taking atorvastatin but is unsure of dose

## 2015-05-01 NOTE — Patient Instructions (Signed)
Diabetes Mellitus and Food It is important for you to manage your blood sugar (glucose) level. Your blood glucose level can be greatly affected by what you eat. Eating healthier foods in the appropriate amounts throughout the day at about the same time each day will help you control your blood glucose level. It can also help slow or prevent worsening of your diabetes mellitus. Healthy eating may even help you improve the level of your blood pressure and reach or maintain a healthy weight.  General recommendations for healthful eating and cooking habits include:  Eating meals and snacks regularly. Avoid going long periods of time without eating to lose weight.  Eating a diet that consists mainly of plant-based foods, such as fruits, vegetables, nuts, legumes, and whole grains.  Using low-heat cooking methods, such as baking, instead of high-heat cooking methods, such as deep frying. Work with your dietitian to make sure you understand how to use the Nutrition Facts information on food labels. HOW CAN FOOD AFFECT ME? Carbohydrates Carbohydrates affect your blood glucose level more than any other type of food. Your dietitian will help you determine how many carbohydrates to eat at each meal and teach you how to count carbohydrates. Counting carbohydrates is important to keep your blood glucose at a healthy level, especially if you are using insulin or taking certain medicines for diabetes mellitus. Alcohol Alcohol can cause sudden decreases in blood glucose (hypoglycemia), especially if you use insulin or take certain medicines for diabetes mellitus. Hypoglycemia can be a life-threatening condition. Symptoms of hypoglycemia (sleepiness, dizziness, and disorientation) are similar to symptoms of having too much alcohol.  If your health care provider has given you approval to drink alcohol, do so in moderation and use the following guidelines:  Women should not have more than one drink per day, and men  should not have more than two drinks per day. One drink is equal to:  12 oz of beer.  5 oz of wine.  1 oz of hard liquor.  Do not drink on an empty stomach.  Keep yourself hydrated. Have water, diet soda, or unsweetened iced tea.  Regular soda, juice, and other mixers might contain a lot of carbohydrates and should be counted. WHAT FOODS ARE NOT RECOMMENDED? As you make food choices, it is important to remember that all foods are not the same. Some foods have fewer nutrients per serving than other foods, even though they might have the same number of calories or carbohydrates. It is difficult to get your body what it needs when you eat foods with fewer nutrients. Examples of foods that you should avoid that are high in calories and carbohydrates but low in nutrients include:  Trans fats (most processed foods list trans fats on the Nutrition Facts label).  Regular soda.  Juice.  Candy.  Sweets, such as cake, pie, doughnuts, and cookies.  Fried foods. WHAT FOODS CAN I EAT? Eat nutrient-rich foods, which will nourish your body and keep you healthy. The food you should eat also will depend on several factors, including:  The calories you need.  The medicines you take.  Your weight.  Your blood glucose level.  Your blood pressure level.  Your cholesterol level. You should eat a variety of foods, including:  Protein.  Lean cuts of meat.  Proteins low in saturated fats, such as fish, egg whites, and beans. Avoid processed meats.  Fruits and vegetables.  Fruits and vegetables that may help control blood glucose levels, such as apples, mangoes, and   yams.  Dairy products.  Choose fat-free or low-fat dairy products, such as milk, yogurt, and cheese.  Grains, bread, pasta, and rice.  Choose whole grain products, such as multigrain bread, whole oats, and brown rice. These foods may help control blood pressure.  Fats.  Foods containing healthful fats, such as nuts,  avocado, olive oil, canola oil, and fish. DOES EVERYONE WITH DIABETES MELLITUS HAVE THE SAME MEAL PLAN? Because every person with diabetes mellitus is different, there is not one meal plan that works for everyone. It is very important that you meet with a dietitian who will help you create a meal plan that is just right for you.   This information is not intended to replace advice given to you by your health care provider. Make sure you discuss any questions you have with your health care provider.   Document Released: 11/22/2004 Document Revised: 03/18/2014 Document Reviewed: 01/22/2013 Elsevier Interactive Patient Education 2016 Elsevier Inc.  

## 2015-05-01 NOTE — Progress Notes (Signed)
ASSESSMENT: Pt currently experiencing pscyosocial stressor, needs to continue with PCP appointment; would benefit from psychoeducation and supportive counseling regarding maintaining behavior change.  Stage of Change: action  PLAN: 1. F/U with behavioral health consultant in as needed 2. Psychiatric Medications: none. 3. Behavioral recommendation(s):   -Remember that a lapse is not a relapse -Consider reading educational material regarding maintaining behavior change -Congratulate self for maintaining change  SUBJECTIVE: Pt. referred by Dr Jarold Song for psychosocial Pt. reports the following symptoms/concerns: Pt states he is doing well, did have one "lapse", but went back to sobriety, is working on finding faith community and continuing to work on improving his health. Pt is looking forward to getting all new teeth in upcoming week.  Duration of problem: Less than one month Severity: mild  OBJECTIVE: Orientation & Cognition: Oriented x3. Thought processes normal and appropriate to situation. Mood: appropriate. Affect: appropriate Appearance: appropriate Risk of harm to self or others: no known risk of harm to self or others Substance use: none current Assessments administered: PHQ2: 0  Diagnosis: Psychosocial stressors CPT Code: Z65.8 -------------------------------------------- Other(s) present in the room: none  Time spent with patient in exam room: 15 minutes, 11:30-11:45am   Depression screen [facility] 2/9 05/01/2015  Decreased Interest 0  Down, Depressed, Hopeless 0  PHQ - 2 Score 0

## 2015-05-02 ENCOUNTER — Other Ambulatory Visit: Payer: Self-pay

## 2015-05-03 ENCOUNTER — Ambulatory Visit: Payer: Medicaid Other | Attending: Internal Medicine

## 2015-05-03 DIAGNOSIS — E118 Type 2 diabetes mellitus with unspecified complications: Secondary | ICD-10-CM

## 2015-05-03 LAB — COMPLETE METABOLIC PANEL WITH GFR
ALT: 10 U/L (ref 9–46)
AST: 18 U/L (ref 10–35)
Albumin: 4.1 g/dL (ref 3.6–5.1)
Alkaline Phosphatase: 83 U/L (ref 40–115)
BUN: 14 mg/dL (ref 7–25)
CALCIUM: 9.8 mg/dL (ref 8.6–10.3)
CO2: 26 mmol/L (ref 20–31)
Chloride: 104 mmol/L (ref 98–110)
Creat: 1.26 mg/dL (ref 0.70–1.33)
GFR, EST AFRICAN AMERICAN: 74 mL/min (ref >=60)
GFR, Est Non African American: 64 mL/min (ref >=60)
Glucose, Bld: 89 mg/dL (ref 65–99)
Potassium: 4.9 mmol/L (ref 3.5–5.3)
Sodium: 140 mmol/L (ref 135–146)
Total Bilirubin: 0.4 mg/dL (ref 0.2–1.2)
Total Protein: 8 g/dL (ref 6.1–8.1)

## 2015-05-03 LAB — LIPID PANEL
CHOLESTEROL: 142 mg/dL (ref 125–200)
HDL: 53 mg/dL (ref >=40)
LDL CALC: 57 mg/dL (ref <130)
TRIGLYCERIDES: 160 mg/dL — AB (ref <150)
Total CHOL/HDL Ratio: 2.7 Ratio (ref <=5.0)
VLDL: 32 mg/dL — AB (ref <30)

## 2015-05-03 NOTE — Patient Instructions (Signed)
Patient aware of receiving FU call with results

## 2015-05-04 ENCOUNTER — Other Ambulatory Visit: Payer: Self-pay | Admitting: Family Medicine

## 2015-05-04 DIAGNOSIS — E089 Diabetes mellitus due to underlying condition without complications: Secondary | ICD-10-CM

## 2015-05-04 LAB — MICROALBUMIN / CREATININE URINE RATIO
CREATININE, URINE: 165 mg/dL (ref 20–370)
Microalb Creat Ratio: 4 mcg/mg creat (ref <30)
Microalb, Ur: 0.7 mg/dL

## 2015-05-04 MED ORDER — METFORMIN HCL 500 MG PO TABS: 1000.0000 mg | ORAL_TABLET | Freq: Two times a day (BID) | ORAL | Status: DC

## 2015-05-04 MED ORDER — SITAGLIPTIN PHOSPHATE 100 MG PO TABS: 100.0000 mg | ORAL_TABLET | Freq: Every day | ORAL | Status: DC

## 2015-05-12 ENCOUNTER — Encounter: Payer: Self-pay | Admitting: Family Medicine

## 2015-05-12 ENCOUNTER — Ambulatory Visit: Payer: Medicaid Other | Attending: Family Medicine | Admitting: Family Medicine

## 2015-05-12 ENCOUNTER — Telehealth: Payer: Self-pay | Admitting: Family Medicine

## 2015-05-12 VITALS — BP 148/98 | HR 83 | Temp 98.2°F | Resp 15 | Ht 68.0 in | Wt 198.0 lb

## 2015-05-12 DIAGNOSIS — E119 Type 2 diabetes mellitus without complications: Secondary | ICD-10-CM | POA: Diagnosis not present

## 2015-05-12 DIAGNOSIS — M1990 Unspecified osteoarthritis, unspecified site: Secondary | ICD-10-CM | POA: Diagnosis not present

## 2015-05-12 DIAGNOSIS — Z888 Allergy status to other drugs, medicaments and biological substances status: Secondary | ICD-10-CM | POA: Insufficient documentation

## 2015-05-12 DIAGNOSIS — G8929 Other chronic pain: Secondary | ICD-10-CM | POA: Diagnosis present

## 2015-05-12 DIAGNOSIS — F1410 Cocaine abuse, uncomplicated: Secondary | ICD-10-CM | POA: Insufficient documentation

## 2015-05-12 DIAGNOSIS — E1149 Type 2 diabetes mellitus with other diabetic neurological complication: Secondary | ICD-10-CM

## 2015-05-12 DIAGNOSIS — I10 Essential (primary) hypertension: Secondary | ICD-10-CM

## 2015-05-12 DIAGNOSIS — F17210 Nicotine dependence, cigarettes, uncomplicated: Secondary | ICD-10-CM | POA: Insufficient documentation

## 2015-05-12 DIAGNOSIS — Z9889 Other specified postprocedural states: Secondary | ICD-10-CM | POA: Diagnosis not present

## 2015-05-12 DIAGNOSIS — M545 Low back pain: Secondary | ICD-10-CM | POA: Diagnosis not present

## 2015-05-12 DIAGNOSIS — M549 Dorsalgia, unspecified: Secondary | ICD-10-CM | POA: Diagnosis not present

## 2015-05-12 LAB — GLUCOSE, POCT (MANUAL RESULT ENTRY): POC GLUCOSE: 89 mg/dL (ref 70–99)

## 2015-05-12 NOTE — Telephone Encounter (Signed)
Patient came in requesting a medication refill for Novalog. Please follow up.

## 2015-05-12 NOTE — Patient Instructions (Signed)

## 2015-05-12 NOTE — Progress Notes (Signed)
Patient needs to pick up his Januvia(prior Josem Kaufmann went through) and his amlodopine He states he is not supposed to be on metformin Reports 5/10 chronic back pain worse when he sits for awhile

## 2015-05-12 NOTE — Progress Notes (Signed)
Subjective:    Patient ID: David Wyatt, male    DOB: 25-Apr-1960, 55 y.o.   MRN: [id number]  HPI He is a 55 year old male with a history of type 2 diabetes mellitus (A1c 6.6), diabetic neuropathy, hypertension, chronic low back pain (status post previous back surgery) who comes into the clinic for follow-up visit.  He was previously being seen by Dr. Annitta Needs and at his last visit I had refilled all of his prescriptions.  He does have tingling in his fingers. Also has low back pain which is exacerbated by prolonged sitting or prolonged standing or heavy lifting but denies any radiation of symptoms down his legs and has no loss of sphincteric function; I had refilled his pain medications and muscle relaxants and but he never picked up this medications as he was unsure of their indications.  Admits to not being very compliant with exercise but tries to eat healthy  Past Medical History  Diagnosis Date  . Hypertension   . Cocaine abuse   . Type II diabetes mellitus ([place])   . Arthritis     "hands" (07/28/2012)  . Angioedema of lips 07/28/2012    left upper (07/29/2012)  . Neuropathy ([place])   . Chronic back pain     Past Surgical History  Procedure Laterality Date  . Inguinal hernia repair  04/01/2012    Procedure: HERNIA REPAIR INGUINAL ADULT;  Surgeon: Haywood Lasso, MD;  Location: [place];  Service: General;  Laterality: Right;  . Hernia repair Right 04/01/2012  . Incision and drainage of wound      boil on back/notes 07/14/2008 (07/29/2012)  . I&d extremity Left 12/13/2012    Procedure: IRRIGATION AND DEBRIDEMENT LEFT THUMB;  Surgeon: Tennis Must, MD;  Location: [place];  Service: Orthopedics;  Laterality: Left;    Social History   Social History  . Marital Status: Legally Separated    Spouse Name: N/A  . Number of Children: N/A  . Years of Education: N/A   Occupational History  . Not on file.   Social History Main Topics  . Smoking status: Current Every Day Smoker -- 0.25  packs/day for 30 years    Types: Cigarettes  . Smokeless tobacco: Never Used     Comment: Smoking 3 cigs/day  . Alcohol Use: No  . Drug Use: No  . Sexual Activity: Not on file   Other Topics Concern  . Not on file   Social History Narrative    Allergies  Allergen Reactions  . Lisinopril Other (See Comments)    Lip Swelling      Review of Systems Constitutional: Negative for activity change and appetite change.  HENT: Negative for sinus pressure and sore throat.   Eyes: Negative for visual disturbance.  Respiratory: Negative for cough, chest tightness and shortness of breath.   Cardiovascular: Negative for chest pain and leg swelling.  Gastrointestinal: Negative for abdominal pain, diarrhea, constipation and abdominal distention.  Endocrine: Negative.   Genitourinary: Negative for dysuria.  Musculoskeletal: Positive for back pain (occasional low back pain). Negative for myalgias and joint swelling.  Skin: Negative for rash.  Allergic/Immunologic: Negative.   Neurological: Positive for numbness ( numbness in fingers). Negative for weakness and light-headedness.  Psychiatric/Behavioral: Negative for suicidal ideas and dysphoric mood.     Objective: Filed Vitals:   05/12/15 1602  BP: 148/98  Pulse: 83  Temp: 98.2 F (36.8 C)  Resp: 15  Height: 5\' 8"  (1.727 m)  Weight: 198 lb (89.812 kg)  SpO2: 95%      Physical Exam Constitutional: He is oriented to person, place, and time. He appears well-developed and well-nourished.  Neck: Normal range of motion.  Cardiovascular: Normal rate, normal heart sounds and intact distal pulses.   No murmur heard. Pulmonary/Chest: Effort normal and breath sounds normal. He has no wheezes. He has no rales. He exhibits no tenderness.  Abdominal: Soft. Bowel sounds are normal. He exhibits no distension and no mass. There is no tenderness.  Musculoskeletal: Normal range of motion. He exhibits no edema or tenderness.  Vertical healed  surgical scar  Neurological: He is alert and oriented to person, place, and time.  Skin: Skin is warm and dry.    Lab Results  Component Value Date   HGBA1C 6.60 05/01/2015    CMP Latest Ref Rng 05/03/2015 09/18/2013 08/15/2013  Glucose 65 - 99 mg/dL 89 158(H) 158(H)  BUN 7 - 25 mg/dL 14 14 20   Creatinine 0.70 - 1.33 mg/dL 1.26 1.00 1.40(H)  Sodium 135 - 146 mmol/L 140 145 137  Potassium 3.5 - 5.3 mmol/L 4.9 3.6(L) 4.2  Chloride 98 - 110 mmol/L 104 105 109  CO2 20 - 31 mmol/L 26 25 -  Calcium 8.6 - 10.3 mg/dL 9.8 9.0 -  Total Protein 6.1 - 8.1 g/dL 8.0 - -  Total Bilirubin 0.2 - 1.2 mg/dL 0.4 - -  Alkaline Phos 40 - 115 U/L 83 - -  AST 10 - 35 U/L 18 - -  ALT 9 - 46 U/L 10 - -     Lipid Panel     Component Value Date/Time   CHOL 142 05/03/2015 0954   TRIG 160* 05/03/2015 0954   HDL 53 05/03/2015 0954   CHOLHDL 2.7 05/03/2015 0954   VLDL 32* 05/03/2015 0954   LDLCALC 57 05/03/2015 0954        Assessment & Plan:  1. Chronic back pain Previous history of back surgery He is yet to commence any of his medications which I have advised him to do and also use heating pad. - gabapentin (NEURONTIN) 300 MG capsule; Take 1 capsule (300 mg total) by mouth 3 (three) times daily.  Dispense: 90 capsule; Refill: 2 - naproxen (NAPROSYN) 500 MG tablet; Take 1 tablet (500 mg total) by mouth 2 (two) times daily with a meal.  Dispense: 30 tablet; Refill: 3 - tiZANidine (ZANAFLEX) 4 MG tablet; Take 1 tablet (4 mg total) by mouth every 8 (eight) hours as needed for muscle spasms.  Dispense: 90 tablet; Refill: 1  2. Essential hypertension, benign Controlled - amLODipine (NORVASC) 2.5 MG tablet; Take 1 tablet (2.5 mg total) by mouth daily.  Dispense: 30 tablet; Refill: 2  3. Other diabetic neurological complication associated with type 2 diabetes mellitus ([place]) Uncontrolled because he has been out of gabapentin Refilled gabapentin  4. Controlled type 2 diabetes mellitus with complication,  without long-term current use of insulin (HCC) A1c of 6.6 Continue ADA diet and last modifications Advised to schedule annual eye exam with ophthalmology or optometry Not on ACE inhibitor because he is allergic - HgB A1c - Glucose (CBG) - sitaGLIPtin-metformin (JANUMET) 50-1000 MG tablet; Take 1 tablet by mouth 2 (two) times daily with a meal.  Dispense: 180 tablet; Refill: 3  This note has been created with Surveyor, quantity. Any transcriptional errors are unintentional.

## 2015-05-15 NOTE — Telephone Encounter (Signed)
Returned call to patient and explained that this RN did not see in his chart where he was taking insulin Patient states he takes 18 units hs of lantus and is on sliding scale novolog-is requesting refills Routing to MD for consideration

## 2015-05-17 NOTE — Telephone Encounter (Signed)
Not sure where he received a prescription for Lantus and NovoLog from but this does not appear on his medication; list we will have to discuss that at an office visit.

## 2015-05-17 NOTE — Telephone Encounter (Signed)
Spoke to patient and explained situation.  [place] up front scheduled him an appointment to discuss DM medications with MD.

## 2015-05-23 ENCOUNTER — Ambulatory Visit: Payer: Medicaid Other | Admitting: Family Medicine

## 2015-05-29 MED FILL — metFORMIN HCL 500 MG TABS: 500 | 30 days supply | Qty: 120 | Fill #0

## 2015-05-29 MED FILL — AMLODIPINE BESYLATE 2.5 MG: 2.5 | 30 days supply | Qty: 30 | Fill #0

## 2015-06-06 ENCOUNTER — Ambulatory Visit: Payer: Medicaid Other | Admitting: Family Medicine

## 2015-06-08 ENCOUNTER — Ambulatory Visit: Payer: Medicaid Other | Admitting: Family Medicine

## 2015-06-13 ENCOUNTER — Encounter: Payer: Self-pay | Admitting: Family Medicine

## 2015-06-13 ENCOUNTER — Ambulatory Visit: Payer: Medicaid Other | Attending: Family Medicine | Admitting: Family Medicine

## 2015-06-13 VITALS — BP 112/70 | HR 82 | Temp 98.0°F | Resp 15 | Ht 68.0 in | Wt 192.0 lb

## 2015-06-13 DIAGNOSIS — K219 Gastro-esophageal reflux disease without esophagitis: Secondary | ICD-10-CM | POA: Insufficient documentation

## 2015-06-13 DIAGNOSIS — I10 Essential (primary) hypertension: Secondary | ICD-10-CM | POA: Insufficient documentation

## 2015-06-13 DIAGNOSIS — M549 Dorsalgia, unspecified: Secondary | ICD-10-CM

## 2015-06-13 DIAGNOSIS — E119 Type 2 diabetes mellitus without complications: Secondary | ICD-10-CM | POA: Insufficient documentation

## 2015-06-13 DIAGNOSIS — Z79899 Other long term (current) drug therapy: Secondary | ICD-10-CM | POA: Diagnosis not present

## 2015-06-13 DIAGNOSIS — G8929 Other chronic pain: Secondary | ICD-10-CM | POA: Diagnosis not present

## 2015-06-13 LAB — GLUCOSE, POCT (MANUAL RESULT ENTRY): POC GLUCOSE: 115 mg/dL — AB (ref 70–99)

## 2015-06-13 MED ORDER — INSULIN ASPART 100 UNIT/ML FLEXPEN: 0.0000 [IU] | PEN_INJECTOR | Freq: Three times a day (TID) | SUBCUTANEOUS | Status: DC

## 2015-06-13 MED ORDER — AMLODIPINE BESYLATE 2.5 MG PO TABS: 2.5000 mg | ORAL_TABLET | Freq: Every day | ORAL | Status: DC

## 2015-06-13 MED ORDER — METFORMIN HCL 500 MG PO TABS: 1000.0000 mg | ORAL_TABLET | Freq: Two times a day (BID) | ORAL | Status: DC

## 2015-06-13 MED ORDER — NAPROXEN 500 MG PO TABS: 500.0000 mg | ORAL_TABLET | Freq: Two times a day (BID) | ORAL | Status: DC

## 2015-06-13 MED ORDER — INSULIN PEN NEEDLE 31G X 5 MM MISC: 1.0000 | Freq: Four times a day (QID) | Status: DC

## 2015-06-13 MED ORDER — GABAPENTIN 300 MG PO CAPS: 300.0000 mg | ORAL_CAPSULE | Freq: Three times a day (TID) | ORAL | Status: DC

## 2015-06-13 MED ORDER — TRAMADOL HCL 50 MG PO TABS: 50.0000 mg | ORAL_TABLET | Freq: Three times a day (TID) | ORAL | Status: DC | PRN

## 2015-06-13 MED ORDER — INSULIN GLARGINE 100 UNIT/ML SOLOSTAR PEN: 18.0000 [IU] | PEN_INJECTOR | Freq: Every day | SUBCUTANEOUS | Status: DC

## 2015-06-13 MED ORDER — SITAGLIPTIN PHOSPHATE 100 MG PO TABS: 100.0000 mg | ORAL_TABLET | Freq: Every day | ORAL | Status: DC

## 2015-06-13 MED ORDER — TIZANIDINE HCL 4 MG PO TABS: 4.0000 mg | ORAL_TABLET | Freq: Three times a day (TID) | ORAL | Status: DC | PRN

## 2015-06-13 MED FILL — BD PEN NDL MINI 31GX5MM: 31G X 5 MM | 25 days supply | Qty: 100 | Fill #0

## 2015-06-13 MED FILL — LANTUS SOLOSTAR 100 UNITS/M: 100 | 33 days supply | Qty: 6 | Fill #0

## 2015-06-13 MED FILL — NAPROXEN 500 MG TABLET: 500 | 15 days supply | Qty: 30 | Fill #0

## 2015-06-13 MED FILL — JANUVIA 100 MG TABLET: 100 | 30 days supply | Qty: 30 | Fill #0

## 2015-06-13 MED FILL — NOVOLOG FLEXPEN SYRINGE: 100 | 33 days supply | Qty: 15 | Fill #0

## 2015-06-13 MED FILL — GABAPENTIN 300 MG CAPSULE: 300 | 30 days supply | Qty: 90 | Fill #0

## 2015-06-13 MED FILL — tiZANidine HCL 4 MG TABS: 4 | 30 days supply | Qty: 90 | Fill #0

## 2015-06-13 NOTE — Progress Notes (Signed)
Patient here to get prescription for insulin that he says he was getting in [facility]

## 2015-06-13 NOTE — Progress Notes (Signed)
Subjective:  Patient ID: David Wyatt, male    DOB: 1960/05/06  Age: 55 y.o. MRN: [id number]  CC: Diabetes   HPI David Wyatt is a 55 year old male with a history of type 2 diabetes mellitus (A1c 6.6), diabetic neuropathy, hypertension, chronic low back pain (status post previous back surgery) who comes into the clinic for follow-up visit.  He is requesting refill on Lantus and NovoLog which he was prescribed at [facility] which he had been taking but was not reflected on his med list in [place].  He also complains of chronic low back pain which is not relieved on his current analgesic regimen; he was previously followed by neurosurgery and orthopedics at [facility] in his care and that once he moved to [place]. He denies radiation down the lower extremities but does have neuropathy in his feet; denies loss of sphincteric function or weakness in his lower extremities. He has to use OTC pain patches to help alleviate pain. Pain is worse whenever he moves.   Outpatient Prescriptions Prior to Visit  Medication Sig Dispense Refill  . esomeprazole (NEXIUM) 40 MG capsule Take 40 mg by mouth daily as needed (for acid reflux). Reported on 05/01/2015    . amLODipine (NORVASC) 2.5 MG tablet Take 1 tablet (2.5 mg total) by mouth daily. 30 tablet 2  . gabapentin (NEURONTIN) 300 MG capsule Take 1 capsule (300 mg total) by mouth 3 (three) times daily. 90 capsule 2  . metFORMIN (GLUCOPHAGE) 500 MG tablet Take 2 tablets (1,000 mg total) by mouth 2 (two) times daily with a meal. 120 tablet 3  . naproxen (NAPROSYN) 500 MG tablet Take 1 tablet (500 mg total) by mouth 2 (two) times daily with a meal. 30 tablet 3  . sitaGLIPtin (JANUVIA) 100 MG tablet Take 1 tablet (100 mg total) by mouth daily. 30 tablet 3  . tiZANidine (ZANAFLEX) 4 MG tablet Take 1 tablet (4 mg total) by mouth every 8 (eight) hours as needed for muscle spasms. 90 tablet 1   No facility-administered medications prior to visit.    ROS Review of  Systems  Constitutional: Negative for activity change and appetite change.  HENT: Negative for sinus pressure and sore throat.   Eyes: Negative for visual disturbance.  Respiratory: Negative for cough, chest tightness and shortness of breath.   Cardiovascular: Negative for chest pain and leg swelling.  Gastrointestinal: Negative for abdominal pain, diarrhea, constipation and abdominal distention.  Endocrine: Negative.   Genitourinary: Negative for dysuria.  Musculoskeletal: Positive for back pain. Negative for myalgias and joint swelling.  Skin: Negative for rash.  Allergic/Immunologic: Negative.   Neurological: Negative for weakness, light-headedness and numbness.  Psychiatric/Behavioral: Negative for suicidal ideas and dysphoric mood.    Objective:  BP 112/70 mmHg  Pulse 82  Temp(Src) 98 F (36.7 C)  Resp 15  Ht 5\' 8"  (1.727 m)  Wt 192 lb (87.091 kg)  BMI 29.20 kg/m2  SpO2 96%  BP/Weight 06/13/2015 05/12/2015 [id number]  Systolic BP [id number]  Diastolic BP 70 98 85  Wt. (Lbs) 192 198 205  BMI 29.2 30.11 31.18      Physical Exam  Constitutional: He is oriented to person, place, and time. He appears well-developed and well-nourished.  Cardiovascular: Normal rate, normal heart sounds and intact distal pulses.   No murmur heard. Pulmonary/Chest: Effort normal and breath sounds normal. He has no wheezes. He has no rales. He exhibits no tenderness.  Abdominal: Soft. Bowel sounds are normal. He exhibits no distension and  no mass. There is no tenderness.  Musculoskeletal: Normal range of motion. He exhibits tenderness (on palpation of lumbar spine; positive straight leg raise on the right).  Neurological: He is alert and oriented to person, place, and time.     Lab Results  Component Value Date   HGBA1C 6.60 05/01/2015    Assessment & Plan:   1. Type 2 diabetes mellitus without complication, without long-term current use of insulin (HCC) Controlled with A1c of 6.6 -  Glucose (CBG) - sitaGLIPtin (JANUVIA) 100 MG tablet; Take 1 tablet (100 mg total) by mouth daily.  Dispense: 30 tablet; Refill: 3 - metFORMIN (GLUCOPHAGE) 500 MG tablet; Take 2 tablets (1,000 mg total) by mouth 2 (two) times daily with a meal.  Dispense: 120 tablet; Refill: 3 - Insulin Glargine (LANTUS SOLOSTAR) 100 UNIT/ML Solostar Pen; Inject 18 Units into the skin daily at 10 pm.  Dispense: 5 pen; Refill: 3 - insulin aspart (NOVOLOG FLEXPEN) 100 UNIT/ML FlexPen; Inject 0-15 Units into the skin 3 (three) times daily with meals. According to sliding scale  Dispense: 15 mL; Refill: 3 - Insulin Pen Needle 31G X 5 MM MISC; 1 each by Does not apply route 4 (four) times daily.  Dispense: 120 each; Refill: 11  2. Chronic back pain Status post back surgery If pain is uncontrolled on current regimen he will need referral to pain management - naproxen (NAPROSYN) 500 MG tablet; Take 1 tablet (500 mg total) by mouth 2 (two) times daily with a meal.  Dispense: 30 tablet; Refill: 3 - tiZANidine (ZANAFLEX) 4 MG tablet; Take 1 tablet (4 mg total) by mouth every 8 (eight) hours as needed for muscle spasms.  Dispense: 90 tablet; Refill: 2 - gabapentin (NEURONTIN) 300 MG capsule; Take 1 capsule (300 mg total) by mouth 3 (three) times daily.  Dispense: 90 capsule; Refill: 2 - traMADol (ULTRAM) 50 MG tablet; Take 1 tablet (50 mg total) by mouth every 8 (eight) hours as needed.  Dispense: 60 tablet; Refill: 1  3. Essential hypertension, benign Controlled - amLODipine (NORVASC) 2.5 MG tablet; Take 1 tablet (2.5 mg total) by mouth daily.  Dispense: 30 tablet; Refill: 2  4. Gastroesophageal reflux disease without esophagitis Controlled on omeprazole   Meds ordered this encounter  Medications  . naproxen (NAPROSYN) 500 MG tablet    Sig: Take 1 tablet (500 mg total) by mouth 2 (two) times daily with a meal.    Dispense:  30 tablet    Refill:  3  . sitaGLIPtin (JANUVIA) 100 MG tablet    Sig: Take 1 tablet (100  mg total) by mouth daily.    Dispense:  30 tablet    Refill:  3    Discontinue Janumet  . tiZANidine (ZANAFLEX) 4 MG tablet    Sig: Take 1 tablet (4 mg total) by mouth every 8 (eight) hours as needed for muscle spasms.    Dispense:  90 tablet    Refill:  2  . amLODipine (NORVASC) 2.5 MG tablet    Sig: Take 1 tablet (2.5 mg total) by mouth daily.    Dispense:  30 tablet    Refill:  2  . metFORMIN (GLUCOPHAGE) 500 MG tablet    Sig: Take 2 tablets (1,000 mg total) by mouth 2 (two) times daily with a meal.    Dispense:  120 tablet    Refill:  3  . gabapentin (NEURONTIN) 300 MG capsule    Sig: Take 1 capsule (300 mg total) by mouth 3 (three)  times daily.    Dispense:  90 capsule    Refill:  2  . Insulin Glargine (LANTUS SOLOSTAR) 100 UNIT/ML Solostar Pen    Sig: Inject 18 Units into the skin daily at 10 pm.    Dispense:  5 pen    Refill:  3  . insulin aspart (NOVOLOG FLEXPEN) 100 UNIT/ML FlexPen    Sig: Inject 0-15 Units into the skin 3 (three) times daily with meals. According to sliding scale    Dispense:  15 mL    Refill:  3  . Insulin Pen Needle 31G X 5 MM MISC    Sig: 1 each by Does not apply route 4 (four) times daily.    Dispense:  120 each    Refill:  11  . traMADol (ULTRAM) 50 MG tablet    Sig: Take 1 tablet (50 mg total) by mouth every 8 (eight) hours as needed.    Dispense:  60 tablet    Refill:  1    Follow-up: Return in about 3 months (around 09/12/2015) for follow up on Diabetes Mellitus.   Arnoldo Morale MD

## 2015-06-22 ENCOUNTER — Telehealth: Payer: Self-pay | Admitting: Clinical

## 2015-06-22 NOTE — Telephone Encounter (Signed)
Termination w pt; David Wyatt aware he may make appointment with [facility] prior to end of April, as needed.

## 2015-06-28 ENCOUNTER — Ambulatory Visit: Payer: Medicaid Other | Attending: Family Medicine | Admitting: Clinical

## 2015-06-28 DIAGNOSIS — Z658 Other specified problems related to psychosocial circumstances: Secondary | ICD-10-CM

## 2015-06-28 NOTE — Progress Notes (Signed)
Less than ten-minute office visit to show his new teeth that he had put in today, and for termination of [facility] services. Pt thankful for services he has received at [facility], including the referral for dental care. Some psychosocial stressors still present, but manageable, no other needs at this time.

## 2015-07-06 ENCOUNTER — Encounter (HOSPITAL_COMMUNITY): Payer: Self-pay | Admitting: *Deleted

## 2015-07-06 ENCOUNTER — Emergency Department (HOSPITAL_COMMUNITY)
Admission: EM | Admit: 2015-07-06 | Discharge: 2015-07-06 | Disposition: A | Discharge status: Home / Self Care | Payer: Medicaid Other | Attending: Emergency Medicine | Admitting: Emergency Medicine

## 2015-07-06 DIAGNOSIS — F17210 Nicotine dependence, cigarettes, uncomplicated: Secondary | ICD-10-CM | POA: Insufficient documentation

## 2015-07-06 DIAGNOSIS — R52 Pain, unspecified: Secondary | ICD-10-CM | POA: Diagnosis present

## 2015-07-06 DIAGNOSIS — E119 Type 2 diabetes mellitus without complications: Secondary | ICD-10-CM | POA: Diagnosis not present

## 2015-07-06 DIAGNOSIS — I10 Essential (primary) hypertension: Secondary | ICD-10-CM | POA: Insufficient documentation

## 2015-07-06 LAB — CBG MONITORING, ED: GLUCOSE-CAPILLARY: 211 mg/dL — AB (ref 65–99)

## 2015-07-06 NOTE — ED Notes (Signed)
Pt called for room, no answer.

## 2015-07-06 NOTE — ED Notes (Signed)
Pt reports generalized pain that started 2 hours ago. Pt states that this happened last when he had high BP and CBG. CBG 211. Pt states that it feels like his body is cramping.

## 2015-07-25 ENCOUNTER — Emergency Department (HOSPITAL_COMMUNITY): Payer: Medicaid Other

## 2015-07-25 ENCOUNTER — Emergency Department (HOSPITAL_COMMUNITY)
Admission: EM | Admit: 2015-07-25 | Discharge: 2015-07-25 | Disposition: A | Discharge status: Home / Self Care | Payer: Medicaid Other | Attending: Emergency Medicine | Admitting: Emergency Medicine

## 2015-07-25 ENCOUNTER — Encounter (HOSPITAL_COMMUNITY): Payer: Self-pay

## 2015-07-25 DIAGNOSIS — Z79899 Other long term (current) drug therapy: Secondary | ICD-10-CM | POA: Insufficient documentation

## 2015-07-25 DIAGNOSIS — E1165 Type 2 diabetes mellitus with hyperglycemia: Secondary | ICD-10-CM | POA: Insufficient documentation

## 2015-07-25 DIAGNOSIS — Z7984 Long term (current) use of oral hypoglycemic drugs: Secondary | ICD-10-CM | POA: Diagnosis not present

## 2015-07-25 DIAGNOSIS — M1990 Unspecified osteoarthritis, unspecified site: Secondary | ICD-10-CM | POA: Insufficient documentation

## 2015-07-25 DIAGNOSIS — G8929 Other chronic pain: Secondary | ICD-10-CM | POA: Insufficient documentation

## 2015-07-25 DIAGNOSIS — G629 Polyneuropathy, unspecified: Secondary | ICD-10-CM | POA: Insufficient documentation

## 2015-07-25 DIAGNOSIS — R0789 Other chest pain: Secondary | ICD-10-CM | POA: Diagnosis not present

## 2015-07-25 DIAGNOSIS — Z791 Long term (current) use of non-steroidal anti-inflammatories (NSAID): Secondary | ICD-10-CM | POA: Diagnosis not present

## 2015-07-25 DIAGNOSIS — R739 Hyperglycemia, unspecified: Secondary | ICD-10-CM

## 2015-07-25 DIAGNOSIS — R079 Chest pain, unspecified: Secondary | ICD-10-CM

## 2015-07-25 DIAGNOSIS — F17210 Nicotine dependence, cigarettes, uncomplicated: Secondary | ICD-10-CM | POA: Diagnosis not present

## 2015-07-25 DIAGNOSIS — Z794 Long term (current) use of insulin: Secondary | ICD-10-CM | POA: Diagnosis not present

## 2015-07-25 DIAGNOSIS — I10 Essential (primary) hypertension: Secondary | ICD-10-CM | POA: Diagnosis not present

## 2015-07-25 LAB — BASIC METABOLIC PANEL
ANION GAP: 9 (ref 5–15)
BUN: 11 mg/dL (ref 6–20)
CALCIUM: 9.1 mg/dL (ref 8.9–10.3)
CO2: 21 mmol/L — AB (ref 22–32)
Chloride: 107 mmol/L (ref 101–111)
Creatinine, Ser: 1.05 mg/dL (ref 0.61–1.24)
Glucose, Bld: 249 mg/dL — ABNORMAL HIGH (ref 65–99)
Potassium: 4.1 mmol/L (ref 3.5–5.1)
Sodium: 137 mmol/L (ref 135–145)

## 2015-07-25 LAB — CBC
HCT: 43 % (ref 39.0–52.0)
HEMOGLOBIN: 14.2 g/dL (ref 13.0–17.0)
MCH: 30.8 pg (ref 26.0–34.0)
MCHC: 33 g/dL (ref 30.0–36.0)
MCV: 93.3 fL (ref 78.0–100.0)
Platelets: 326 10*3/uL (ref 150–400)
RBC: 4.61 MIL/uL (ref 4.22–5.81)
RDW: 14.2 % (ref 11.5–15.5)
WBC: 5.8 10*3/uL (ref 4.0–10.5)

## 2015-07-25 LAB — I-STAT TROPONIN, ED: TROPONIN I, POC: 0 ng/mL (ref 0.00–0.08)

## 2015-07-25 MED ORDER — OXYCODONE-ACETAMINOPHEN 5-325 MG PO TABS
1.0000 | ORAL_TABLET | Freq: Once | ORAL | Status: AC
  Administered 2015-07-25: 1 via ORAL
  Filled 2015-07-25: qty 1

## 2015-07-25 NOTE — Discharge Instructions (Signed)

## 2015-07-25 NOTE — ED Notes (Signed)
Per EMS pt was taking out transmission in car on 07/24/15 and felt like he pulled something in his chest; Pt states when he woke up this morning he had a sharp pain in chest; pt states he does not feel the pain in central chest on arrival but has a burning feeling to left chest at 4/10; Pt is a&ox 4 on arrival; Pt states he has HTN but has not had meds for a couple of months; pt also has hx DM;

## 2015-07-25 NOTE — ED Notes (Signed)
MD at bedside. 

## 2015-07-25 NOTE — ED Notes (Signed)
Patient transported to X-ray 

## 2015-07-25 NOTE — ED Provider Notes (Signed)
CSN: [id number]     Arrival date & time 07/25/15  1938 History   First MD Initiated Contact with Patient 07/25/15 1946     Chief Complaint  Patient presents with  . Chest Pain    Patient is a 55 y.o. male presenting with chest pain. The history is provided by the patient and a significant other.  Chest Pain Pain location:  Substernal area Pain quality: sharp   Pain radiates to:  Does not radiate Pain radiates to the back: no   Pain severity:  Moderate Onset quality:  Gradual Duration:  12 hours Timing:  Intermittent Progression:  Improving Chronicity:  New Relieved by:  None tried Worsened by:  Certain positions and movement Associated symptoms: no diaphoresis, no shortness of breath, no syncope and not vomiting   Risk factors: smoking   Risk factors: no coronary artery disease and no prior DVT/PE   Patient reports heavy lifting yesterday.  He denies any CP at that time This morning he woke up with CP that is only there with movement of upper body or palpation of chest No SOB He does report feeling more tired than usual but no other symptoms He is now feeling improved  Past Medical History  Diagnosis Date  . Hypertension   . Cocaine abuse   . Type II diabetes mellitus ([place])   . Arthritis     "hands" (07/28/2012)  . Angioedema of lips 07/28/2012    left upper (07/29/2012)  . Neuropathy ([place])   . Chronic back pain    Past Surgical History  Procedure Laterality Date  . Inguinal hernia repair  04/01/2012    Procedure: HERNIA REPAIR INGUINAL ADULT;  Surgeon: Haywood Lasso, MD;  Location: [facility];  Service: General;  Laterality: Right;  . Hernia repair Right 04/01/2012  . Incision and drainage of wound      boil on back/notes 07/14/2008 (07/29/2012)  . I&d extremity Left 12/13/2012    Procedure: IRRIGATION AND DEBRIDEMENT LEFT THUMB;  Surgeon: Tennis Must, MD;  Location: [facility];  Service: Orthopedics;  Laterality: Left;   Family History  Problem Relation Age of Onset  .  Diabetes Father    Social History  Substance Use Topics  . Smoking status: Current Every Day Smoker -- 0.25 packs/day for 30 years    Types: Cigarettes  . Smokeless tobacco: Never Used     Comment: Smoking 3 cigs/day  . Alcohol Use: No    Review of Systems  Constitutional: Negative for diaphoresis.  Respiratory: Negative for shortness of breath.   Cardiovascular: Positive for chest pain. Negative for syncope.  Gastrointestinal: Negative for vomiting.  All other systems reviewed and are negative.     Allergies  Lisinopril  Home Medications   Prior to Admission medications   Medication Sig Start Date End Date Taking? Authorizing Provider  amLODipine (NORVASC) 2.5 MG tablet Take 1 tablet (2.5 mg total) by mouth daily. 06/13/15   Arnoldo Morale, MD  esomeprazole (NEXIUM) 40 MG capsule Take 40 mg by mouth daily as needed (for acid reflux). Reported on 05/01/2015    Historical Provider, MD  gabapentin (NEURONTIN) 300 MG capsule Take 1 capsule (300 mg total) by mouth 3 (three) times daily. 06/13/15   Arnoldo Morale, MD  insulin aspart (NOVOLOG FLEXPEN) 100 UNIT/ML FlexPen Inject 0-15 Units into the skin 3 (three) times daily with meals. According to sliding scale 06/13/15   Arnoldo Morale, MD  Insulin Glargine (LANTUS SOLOSTAR) 100 UNIT/ML Solostar Pen Inject 18 Units  into the skin daily at 10 pm. 06/13/15   Arnoldo Morale, MD  Insulin Pen Needle 31G X 5 MM MISC 1 each by Does not apply route 4 (four) times daily. 06/13/15   Arnoldo Morale, MD  metFORMIN (GLUCOPHAGE) 500 MG tablet Take 2 tablets (1,000 mg total) by mouth 2 (two) times daily with a meal. 06/13/15   Arnoldo Morale, MD  naproxen (NAPROSYN) 500 MG tablet Take 1 tablet (500 mg total) by mouth 2 (two) times daily with a meal. 06/13/15   Arnoldo Morale, MD  sitaGLIPtin (JANUVIA) 100 MG tablet Take 1 tablet (100 mg total) by mouth daily. 06/13/15   Arnoldo Morale, MD  tiZANidine (ZANAFLEX) 4 MG tablet Take 1 tablet (4 mg total) by mouth every 8 (eight) hours  as needed for muscle spasms. 06/13/15   Arnoldo Morale, MD  traMADol (ULTRAM) 50 MG tablet Take 1 tablet (50 mg total) by mouth every 8 (eight) hours as needed. 06/13/15   Arnoldo Morale, MD   BP 146/99 mmHg  Pulse 83  Temp(Src) 98.8 F (37.1 C) (Oral)  Resp 18  Ht 5\' 8"  (1.727 m)  Wt 90.719 kg  BMI 30.42 kg/m2  SpO2 92% Physical Exam CONSTITUTIONAL: Well developed/well nourished HEAD: Normocephalic/atraumatic EYES: EOMI/PERRL ENMT: Mucous membranes moist NECK: supple no meningeal signs SPINE/BACK:entire spine nontender CV: S1/S2 noted, no murmurs/rubs/gallops noted Chest - tenderness to lower chest wall.  He has reproduction of pain with movement of upper body LUNGS: Lungs are clear to auscultation bilaterally, no apparent distress ABDOMEN: soft, nontender, no rebound or guarding, bowel sounds noted throughout abdomen GU:no cva tenderness NEURO: Pt is awake/alert/appropriate, moves all extremitiesx4.  No facial droop.   EXTREMITIES: pulses normal/equalx4, full ROM, no LE edema or tenderness SKIN: warm, color normal PSYCH: no abnormalities of mood noted, alert and oriented to situation  ED Course  Procedures  8:21 PM Pt with heavy lifting yesterday (but denies CP/SOB/weakness at that time) Woke up today with CP only with palpation and movement No other symptoms He is well appearing Significant other called EMS because he didn't want to go to hospital 9:43 PM Pt improved No distress He is already requesting something to eat and also discharge home CXR negative He is well appearing CP was reproducible on exam I have low suspicion for ACS On my eval, RA pulse ox >95% and no tachycardia to suggest acute PE Will d/c home  Labs Review Labs Reviewed  BASIC METABOLIC PANEL - Abnormal; Notable for the following:    CO2 21 (*)    Glucose, Bld 249 (*)    All other components within normal limits  CBC  I-STAT TROPOININ, ED    Imaging Review Dg Chest 2 View  07/25/2015   CLINICAL DATA:  55 year old male with chest pain EXAM: CHEST  2 VIEW COMPARISON:  Chest radiograph dated 03/30/2012 FINDINGS: Two views of the chest demonstrate diffuse mild interstitial prominence, likely chronic. There is no consolidation. No pleural effusion or pneumothorax. The cardiac silhouette is within normal limits Mets no acute osseous pathology identified. IMPRESSION: No active cardiopulmonary disease. Electronically Signed   By: Anner Crete M.D.   On: 07/25/2015 21:33   I have personally reviewed and evaluated these images and lab results as part of my medical decision-making.   EKG Interpretation   Date/Time:  Tuesday Jul 25 2015 19:45:39 EDT Ventricular Rate:  75 PR Interval:  154 QRS Duration: 74 QT Interval:  387 QTC Calculation: 432 R Axis:   -10 Text Interpretation:  Sinus rhythm Probable left atrial enlargement No  significant change since last tracing Confirmed by Christy Gentles  MD, Tvisha Schwoerer  [phone]) on 07/25/2015 7:48:23 PM      MDM   Final diagnoses:  Chest pain, unspecified chest pain type  Hyperglycemia    Nursing notes including past medical history and social history reviewed and considered in documentation. xrays/imaging reviewed by myself and considered during evaluation Labs/vital reviewed myself and considered during evaluation     Ripley Fraise, MD 07/25/15 2144

## 2015-08-04 ENCOUNTER — Telehealth: Payer: Self-pay | Admitting: Family Medicine

## 2015-08-04 NOTE — Telephone Encounter (Signed)
Pt. Called requesting a refill on his DM and HTN medication. Please f/u

## 2015-08-08 MED FILL — metFORMIN HCL 500 MG TABS: 500 | 30 days supply | Qty: 120 | Fill #1

## 2015-08-08 MED FILL — tiZANidine HCL 4 MG TABS: 4 | 30 days supply | Qty: 90 | Fill #1

## 2015-08-08 MED FILL — AMLODIPINE BESYLATE 2.5 MG: 2.5 | 30 days supply | Qty: 30 | Fill #0

## 2015-08-21 ENCOUNTER — Telehealth: Payer: Self-pay

## 2015-08-21 ENCOUNTER — Telehealth: Payer: Self-pay | Admitting: Family Medicine

## 2015-08-21 NOTE — Telephone Encounter (Signed)
Writer spoke with patient who states he is having severe back pain and would like a referral to a orthopedic MD.  He also is requesting a refill on his tramadol along with the rest of his meds.

## 2015-08-21 NOTE — Telephone Encounter (Signed)
Patient called requesting medication refill on all current medication and states he is having back pain, please f/up

## 2015-08-21 NOTE — Telephone Encounter (Signed)
Pt  Is calling trying to be seen by you for back pain and was told all of your appointments are booked, the front office wants to know if you want to work pt in or have him go to an urgent care regarding his back pain.

## 2015-08-21 NOTE — Telephone Encounter (Signed)
Appt made per Dr. Jarold Song.  Patient is aware.

## 2015-08-21 NOTE — Telephone Encounter (Signed)
His orthopedic surgeon is at [facility]; Ortho would refer back to the person who did his surgery. I am happy to refer him to pain management if he would like. All his meds were refilled at his last visit with additional refills provided and will not be due until his appointment with me which comes up next month. A prior message from today indicated he wanted a visit with me; you can schedule him for 12 noon tomorrow and I will refill his tramadol then and discuss all of the above with him.

## 2015-08-22 ENCOUNTER — Ambulatory Visit: Payer: Medicaid Other | Attending: Family Medicine | Admitting: Family Medicine

## 2015-08-22 ENCOUNTER — Encounter: Payer: Self-pay | Admitting: Family Medicine

## 2015-08-22 VITALS — BP 132/91 | HR 85 | Temp 98.4°F | Resp 18 | Ht 68.0 in | Wt 185.0 lb

## 2015-08-22 DIAGNOSIS — E118 Type 2 diabetes mellitus with unspecified complications: Secondary | ICD-10-CM | POA: Diagnosis not present

## 2015-08-22 DIAGNOSIS — M549 Dorsalgia, unspecified: Secondary | ICD-10-CM | POA: Diagnosis not present

## 2015-08-22 DIAGNOSIS — G8929 Other chronic pain: Secondary | ICD-10-CM

## 2015-08-22 DIAGNOSIS — M5126 Other intervertebral disc displacement, lumbar region: Secondary | ICD-10-CM | POA: Diagnosis not present

## 2015-08-22 LAB — GLUCOSE, POCT (MANUAL RESULT ENTRY): POC GLUCOSE: 125 mg/dL — AB (ref 70–99)

## 2015-08-22 LAB — POCT GLYCOSYLATED HEMOGLOBIN (HGB A1C): Hemoglobin A1C: 6.6

## 2015-08-22 MED ORDER — TRAMADOL HCL 50 MG PO TABS: 50.0000 mg | ORAL_TABLET | Freq: Three times a day (TID) | ORAL | Status: DC | PRN

## 2015-08-22 MED FILL — LANTUS SOLOSTAR 100 UNITS/M: 100 | 33 days supply | Qty: 6 | Fill #1

## 2015-08-22 MED FILL — traMADol HCL 50 MG TABS: 50 | 20 days supply | Qty: 60 | Fill #0

## 2015-08-22 MED FILL — GABAPENTIN 300 MG CAPSULE: 300 | 30 days supply | Qty: 90 | Fill #1

## 2015-08-22 MED FILL — BD PEN NDL MINI 31GX5MM: 31G X 5 MM | 25 days supply | Qty: 100 | Fill #1

## 2015-08-22 MED FILL — NOVOLOG FLEXPEN SYRINGE: 100 | 33 days supply | Qty: 15 | Fill #1

## 2015-08-22 MED FILL — JANUVIA 100 MG TABLET: 100 | 30 days supply | Qty: 30 | Fill #1

## 2015-08-22 NOTE — Progress Notes (Signed)
Patient here today for severe lower back pain 8/10. Needs medication refills

## 2015-08-22 NOTE — Progress Notes (Signed)
Subjective:  Patient ID: David Wyatt, male    DOB: 27-Nov-1960  Age: 55 y.o. MRN: [id number]  CC: Back Pain and Diabetes   HPI David Wyatt is a 55 year old male with a history of type 2 diabetes mellitus (A1c 6.6), diabetic neuropathy, hypertension, chronic low back pain (status post previous back surgery) who comes into the clinic for follow-up visit. Pain is 8/10 when he moves but is absent at rest; he sometimes has radiation of pain down both lower extremities and pain is aggravated by heavy lifting which he has to do at his place of work. He was previously followed by orthopedics and neurosurgery at [facility] where he had his surgery until he relocated to [place]. Denies loss of sphincteric function, falls or weakness in the lower extremity.   Outpatient Prescriptions Prior to Visit  Medication Sig Dispense Refill  . amLODipine (NORVASC) 2.5 MG tablet Take 1 tablet (2.5 mg total) by mouth daily. 30 tablet 2  . insulin aspart (NOVOLOG FLEXPEN) 100 UNIT/ML FlexPen Inject 0-15 Units into the skin 3 (three) times daily with meals. According to sliding scale 15 mL 3  . Insulin Glargine (LANTUS SOLOSTAR) 100 UNIT/ML Solostar Pen Inject 18 Units into the skin daily at 10 pm. 5 pen 3  . Insulin Pen Needle 31G X 5 MM MISC 1 each by Does not apply route 4 (four) times daily. 120 each 11  . metFORMIN (GLUCOPHAGE) 500 MG tablet Take 2 tablets (1,000 mg total) by mouth 2 (two) times daily with a meal. 120 tablet 3  . tiZANidine (ZANAFLEX) 4 MG tablet Take 1 tablet (4 mg total) by mouth every 8 (eight) hours as needed for muscle spasms. 90 tablet 2  . esomeprazole (NEXIUM) 40 MG capsule Take 40 mg by mouth daily as needed (for acid reflux). Reported on 08/22/2015    . gabapentin (NEURONTIN) 300 MG capsule Take 1 capsule (300 mg total) by mouth 3 (three) times daily. (Patient not taking: Reported on 08/22/2015) 90 capsule 2  . naproxen (NAPROSYN) 500 MG tablet Take 1 tablet (500 mg total) by mouth 2  (two) times daily with a meal. (Patient not taking: Reported on 08/22/2015) 30 tablet 3  . sitaGLIPtin (JANUVIA) 100 MG tablet Take 1 tablet (100 mg total) by mouth daily. (Patient not taking: Reported on 08/22/2015) 30 tablet 3  . traMADol (ULTRAM) 50 MG tablet Take 1 tablet (50 mg total) by mouth every 8 (eight) hours as needed. (Patient not taking: Reported on 08/22/2015) 60 tablet 1   No facility-administered medications prior to visit.    ROS Review of Systems  Constitutional: Negative for activity change and appetite change.  HENT: Negative for sinus pressure and sore throat.   Respiratory: Negative for chest tightness, shortness of breath and wheezing.   Cardiovascular: Negative for chest pain and palpitations.  Gastrointestinal: Negative for abdominal pain, constipation and abdominal distention.  Genitourinary: Negative.   Musculoskeletal: Positive for back pain.  Psychiatric/Behavioral: Negative for behavioral problems and dysphoric mood.    Objective:  BP 132/91 mmHg  Pulse 85  Temp(Src) 98.4 F (36.9 C) (Oral)  Resp 18  Ht 5\' 8"  (1.727 m)  Wt 185 lb (83.915 kg)  BMI 28.14 kg/m2  SpO2 97%  BP/Weight 08/22/2015 07/25/2015 [id number]  Systolic BP Q000111Q [id number]  Diastolic BP 91 94 87  Wt. (Lbs) 185 200 -  BMI 28.14 30.42 -      Physical Exam Constitutional: He is oriented to person, place, and time. He appears well-developed  and well-nourished.  Cardiovascular: Normal rate, normal heart sounds and intact distal pulses.   No murmur heard. Pulmonary/Chest: Effort normal and breath sounds normal. He has no wheezes. He has no rales. He exhibits no tenderness.  Abdominal: Soft. Bowel sounds are normal. He exhibits no distension and no mass. There is no tenderness.  Musculoskeletal: Normal range of motion. Vertical lumbar surgical scar He exhibits tenderness (on palpation of lumbar spine; positive straight leg raise on the right).  Neurological: He is alert and oriented to  person, place, and time.    Assessment & Plan:   1. Controlled type 2 diabetes mellitus with complication, without long-term current use of insulin (HCC) Controlled with A1c of 6.6 - Glucose (CBG) - HgB A1c  2. Chronic back pain No red flags - traMADol (ULTRAM) 50 MG tablet; Take 1 tablet (50 mg total) by mouth every 8 (eight) hours as needed.  Dispense: 60 tablet; Refill: 1 - Ambulatory referral to Pain Clinic  3. Lumbar disc herniation S/p back surgery He will be getting in touch with his Surgeon in [place] for an office visit. - Ambulatory referral to Pain Clinic   Meds ordered this encounter  Medications  . traMADol (ULTRAM) 50 MG tablet    Sig: Take 1 tablet (50 mg total) by mouth every 8 (eight) hours as needed.    Dispense:  60 tablet    Refill:  1    Follow-up: Return in about 1 month (around 09/21/2015) for follow up on Diabetes mellitus.   Arnoldo Morale MD

## 2015-08-31 ENCOUNTER — Other Ambulatory Visit: Payer: Self-pay | Admitting: *Deleted

## 2015-08-31 DIAGNOSIS — M549 Dorsalgia, unspecified: Principal | ICD-10-CM

## 2015-08-31 DIAGNOSIS — I10 Essential (primary) hypertension: Secondary | ICD-10-CM

## 2015-08-31 DIAGNOSIS — G8929 Other chronic pain: Secondary | ICD-10-CM

## 2015-08-31 DIAGNOSIS — E119 Type 2 diabetes mellitus without complications: Secondary | ICD-10-CM

## 2015-08-31 MED ORDER — INSULIN GLARGINE 100 UNIT/ML SOLOSTAR PEN: 18.0000 [IU] | PEN_INJECTOR | Freq: Every day | SUBCUTANEOUS | Status: DC

## 2015-08-31 MED ORDER — AMLODIPINE BESYLATE 2.5 MG PO TABS: 2.5000 mg | ORAL_TABLET | Freq: Every day | ORAL | Status: DC

## 2015-08-31 MED ORDER — INSULIN PEN NEEDLE 31G X 5 MM MISC: 1.0000 | Freq: Four times a day (QID) | Status: DC

## 2015-08-31 MED ORDER — METFORMIN HCL 500 MG PO TABS: 1000.0000 mg | ORAL_TABLET | Freq: Two times a day (BID) | ORAL | Status: DC

## 2015-08-31 MED ORDER — GABAPENTIN 300 MG PO CAPS: 300.0000 mg | ORAL_CAPSULE | Freq: Three times a day (TID) | ORAL | Status: DC

## 2015-08-31 MED ORDER — INSULIN ASPART 100 UNIT/ML FLEXPEN: 0.0000 [IU] | PEN_INJECTOR | Freq: Three times a day (TID) | SUBCUTANEOUS | Status: DC

## 2015-08-31 NOTE — Telephone Encounter (Signed)
Patients medications have been refilled for DM and HTN. Patient was seen on 08/22/15.

## 2015-09-17 ENCOUNTER — Emergency Department (HOSPITAL_COMMUNITY): Payer: Medicaid Other

## 2015-09-17 ENCOUNTER — Emergency Department (HOSPITAL_COMMUNITY)
Admission: EM | Admit: 2015-09-17 | Discharge: 2015-09-17 | Disposition: A | Discharge status: Home / Self Care | Payer: Medicaid Other | Attending: Emergency Medicine | Admitting: Emergency Medicine

## 2015-09-17 ENCOUNTER — Encounter (HOSPITAL_COMMUNITY): Payer: Self-pay

## 2015-09-17 DIAGNOSIS — R079 Chest pain, unspecified: Secondary | ICD-10-CM | POA: Diagnosis present

## 2015-09-17 DIAGNOSIS — Z794 Long term (current) use of insulin: Secondary | ICD-10-CM | POA: Diagnosis not present

## 2015-09-17 DIAGNOSIS — Z7984 Long term (current) use of oral hypoglycemic drugs: Secondary | ICD-10-CM | POA: Diagnosis not present

## 2015-09-17 DIAGNOSIS — Z79899 Other long term (current) drug therapy: Secondary | ICD-10-CM | POA: Insufficient documentation

## 2015-09-17 DIAGNOSIS — F17210 Nicotine dependence, cigarettes, uncomplicated: Secondary | ICD-10-CM | POA: Insufficient documentation

## 2015-09-17 DIAGNOSIS — E1140 Type 2 diabetes mellitus with diabetic neuropathy, unspecified: Secondary | ICD-10-CM | POA: Diagnosis not present

## 2015-09-17 DIAGNOSIS — I10 Essential (primary) hypertension: Secondary | ICD-10-CM | POA: Insufficient documentation

## 2015-09-17 LAB — CBC
HEMATOCRIT: 46.1 % (ref 39.0–52.0)
HEMOGLOBIN: 14.8 g/dL (ref 13.0–17.0)
MCH: 30.6 pg (ref 26.0–34.0)
MCHC: 32.1 g/dL (ref 30.0–36.0)
MCV: 95.4 fL (ref 78.0–100.0)
Platelets: 326 10*3/uL (ref 150–400)
RBC: 4.83 MIL/uL (ref 4.22–5.81)
RDW: 13.5 % (ref 11.5–15.5)
WBC: 5 10*3/uL (ref 4.0–10.5)

## 2015-09-17 LAB — BASIC METABOLIC PANEL
ANION GAP: 8 (ref 5–15)
BUN: 8 mg/dL (ref 6–20)
CALCIUM: 8.9 mg/dL (ref 8.9–10.3)
CO2: 24 mmol/L (ref 22–32)
Chloride: 107 mmol/L (ref 101–111)
Creatinine, Ser: 1.09 mg/dL (ref 0.61–1.24)
GFR calc Af Amer: >60 mL/min (ref >60)
GFR calc non Af Amer: >60 mL/min (ref >60)
GLUCOSE: 174 mg/dL — AB (ref 65–99)
Potassium: 4 mmol/L (ref 3.5–5.1)
Sodium: 139 mmol/L (ref 135–145)

## 2015-09-17 LAB — I-STAT TROPONIN, ED
TROPONIN I, POC: 0 ng/mL (ref 0.00–0.08)
Troponin i, poc: 0 ng/mL (ref 0.00–0.08)

## 2015-09-17 MED ORDER — MORPHINE SULFATE (PF) 4 MG/ML IV SOLN
4.0000 mg | Freq: Once | INTRAVENOUS | Status: AC
  Administered 2015-09-17: 4 mg via INTRAVENOUS
  Filled 2015-09-17: qty 1

## 2015-09-17 MED ORDER — NAPROXEN 500 MG PO TABS: 500.0000 mg | ORAL_TABLET | Freq: Two times a day (BID) | ORAL | Status: DC

## 2015-09-17 NOTE — ED Notes (Signed)
[place] Sandwich and drink provided.Marland KitchenMarland KitchenMarland Kitchen

## 2015-09-17 NOTE — ED Provider Notes (Addendum)
CSN: [id number]     Arrival date & time 09/17/15  1239 History   First MD Initiated Contact with Patient 09/17/15 1317     Chief Complaint  Patient presents with  . Chest Pain     (Consider location/radiation/quality/duration/timing/severity/associated sxs/prior Treatment) HPI   David Wyatt is a 55 y.o M with a pmhx of HTN, cocaine abuse, DM, neuropathy who presents to the ED today c.o left sided chest pain onset 1 week ago. Pt states that the pain is intermittent but is worsened with deep inspiration and with movement. Pt reports similar pain 1 month ago which he was seen in the ED for. He denies any dizziness, diaphoresis, abdominal pain, LOC, cough. Pt has ongoing bilateral hand and feet paresthesias due to neuropathy. He is a current, every day smoker. He denies any cocaine use. No LE swelling. NO recent travel.    Past Medical History  Diagnosis Date  . Hypertension   . Cocaine abuse   . Type II diabetes mellitus ([place])   . Arthritis     "hands" (07/28/2012)  . Angioedema of lips 07/28/2012    left upper (07/29/2012)  . Neuropathy ([place])   . Chronic back pain    Past Surgical History  Procedure Laterality Date  . Inguinal hernia repair  04/01/2012    Procedure: HERNIA REPAIR INGUINAL ADULT;  Surgeon: Haywood Lasso, MD;  Location: [facility];  Service: General;  Laterality: Right;  . Hernia repair Right 04/01/2012  . Incision and drainage of wound      boil on back/notes 07/14/2008 (07/29/2012)  . I&d extremity Left 12/13/2012    Procedure: IRRIGATION AND DEBRIDEMENT LEFT THUMB;  Surgeon: Tennis Must, MD;  Location: [facility];  Service: Orthopedics;  Laterality: Left;   Family History  Problem Relation Age of Onset  . Diabetes Father    Social History  Substance Use Topics  . Smoking status: Current Every Day Smoker -- 0.25 packs/day for 30 years    Types: Cigarettes    Last Attempt to Quit: 08/15/2015  . Smokeless tobacco: Former Systems developer    Quit date: 08/15/2015     Comment: Has  not had a cig in one week  . Alcohol Use: No    Review of Systems  All other systems reviewed and are negative.     Allergies  Lisinopril  Home Medications   Prior to Admission medications   Medication Sig Start Date End Date Taking? Authorizing Provider  amLODipine (NORVASC) 2.5 MG tablet Take 1 tablet (2.5 mg total) by mouth daily. 08/31/15  Yes Arnoldo Morale, MD  gabapentin (NEURONTIN) 300 MG capsule Take 1 capsule (300 mg total) by mouth 3 (three) times daily. 08/31/15  Yes Arnoldo Morale, MD  insulin aspart (NOVOLOG FLEXPEN) 100 UNIT/ML FlexPen Inject 0-15 Units into the skin 3 (three) times daily with meals. According to sliding scale 08/31/15  Yes Arnoldo Morale, MD  Insulin Glargine (LANTUS SOLOSTAR) 100 UNIT/ML Solostar Pen Inject 18 Units into the skin daily at 10 pm. 08/31/15  Yes Arnoldo Morale, MD  Insulin Pen Needle 31G X 5 MM MISC 1 each by Does not apply route 4 (four) times daily. 08/31/15  Yes Arnoldo Morale, MD  metFORMIN (GLUCOPHAGE) 500 MG tablet Take 2 tablets (1,000 mg total) by mouth 2 (two) times daily with a meal. 08/31/15  Yes Arnoldo Morale, MD  naproxen (NAPROSYN) 500 MG tablet Take 1 tablet (500 mg total) by mouth 2 (two) times daily with a meal. 06/13/15  Yes  Arnoldo Morale, MD  tiZANidine (ZANAFLEX) 4 MG tablet Take 1 tablet (4 mg total) by mouth every 8 (eight) hours as needed for muscle spasms. 06/13/15  Yes Arnoldo Morale, MD  traMADol (ULTRAM) 50 MG tablet Take 1 tablet (50 mg total) by mouth every 8 (eight) hours as needed. Patient taking differently: Take 50 mg by mouth every 8 (eight) hours as needed for moderate pain.  08/22/15  Yes Enobong Amao, MD   BP 146/90 mmHg  Pulse 65  Temp(Src) 98 F (36.7 C) (Oral)  Resp 22  Ht 5\' 8"  (1.727 m)  Wt 86.183 kg  BMI 28.90 kg/m2  SpO2 92% Physical Exam  Constitutional: He is oriented to person, place, and time. He appears well-developed and well-nourished. No distress.  HENT:  Head: Normocephalic and atraumatic.   Mouth/Throat: No oropharyngeal exudate.  Eyes: Conjunctivae and EOM are normal. Pupils are equal, round, and reactive to light. Right eye exhibits no discharge. Left eye exhibits no discharge. No scleral icterus.  Neck: No JVD present.  Cardiovascular: Normal rate, regular rhythm, normal heart sounds and intact distal pulses.  Exam reveals no gallop and no friction rub.   No murmur heard. Pulmonary/Chest: Effort normal and breath sounds normal. No respiratory distress. He has no wheezes. He has no rales. He exhibits no tenderness.  Abdominal: Soft. He exhibits no distension. There is no tenderness. There is no guarding.  Musculoskeletal: Normal range of motion. He exhibits no edema.  Neurological: He is alert and oriented to person, place, and time.  Skin: Skin is warm and dry. No rash noted. He is not diaphoretic. No erythema. No pallor.  Psychiatric: He has a normal mood and affect. His behavior is normal.  Nursing note and vitals reviewed.   ED Course  Procedures (including critical care time) Labs Review Labs Reviewed  BASIC METABOLIC PANEL - Abnormal; Notable for the following:    Glucose, Bld 174 (*)    All other components within normal limits  CBC  I-STAT TROPOININ, ED  Randolm Idol, ED    Imaging Review Dg Chest 2 View  09/17/2015  CLINICAL DATA:  Chest pain EXAM: CHEST  2 VIEW COMPARISON:  07/25/2015 FINDINGS: Normal heart size. Bibasilar atelectasis. No pneumothorax. No pleural effusion. IMPRESSION: Bibasilar atelectasis Electronically Signed   By: Marybelle Killings M.D.   On: 09/17/2015 13:34   I have personally reviewed and evaluated these images and lab results as part of my medical decision-making.   EKG Interpretation   Date/Time:  Sunday September 17 2015 12:42:33 EDT Ventricular Rate:  68 PR Interval:  150 QRS Duration: 78 QT Interval:  412 QTC Calculation: 438 R Axis:   11 Text Interpretation:  Normal sinus rhythm Normal ECG No acute changes No  significant  change since last tracing Confirmed by Kathrynn Humble, MD, Thelma Comp  [phone]) on 09/17/2015 1:08:51 PM      MDM   Final diagnoses:  Chest pain, unspecified chest pain type   55 y.o M presents to the ED today c/o intermittent L sided chest pain onset 1 week ago that is worsened with deep inspiration. Pt appears well in ED, in NAD. All VSS. Chest pain is not reproducible on exam. Pt has had similar symptoms 1 month ago and was seen in the ED and discharged with PCP follow up. EKG is unremarkable. NO sign of ischemia. Initial troponin 0.0. Although pain is worsened with deep inspiration, do not suspect PE. Pt is low risk well's criteria and has no risk factors for  PE. NO hypoxia or tachycardia. Pt pain managed in ED and he is now chest pain free. Repeat troponin also 0.0. Pt is low risk HEART score. Low suspicion ACS. NO recent URI or infection, doubt pericarditis. Pain is not positional. Pt is now requesting something to eat and to go home. Feel that he is safe for discharge with PCP follow up.   Case discussed with Dr. Kathrynn Humble who agrees with treatment plan.    [place], PA-C 09/18/15 [place], MD 09/20/15 [place], PA-C 09/20/15 [id number]  Varney Biles, MD 09/21/15 1144

## 2015-09-17 NOTE — Discharge Instructions (Signed)
Chest Wall Pain Chest wall pain is pain in or around the bones and muscles of your chest. Sometimes, an injury causes this pain. Sometimes, the cause may not be known. This pain may take several weeks or longer to get better. HOME CARE INSTRUCTIONS  Pay attention to any changes in your symptoms. Take these actions to help with your pain:   Rest as told by your health care provider.   Avoid activities that cause pain. These include any activities that use your chest muscles or your abdominal and side muscles to lift heavy items.   If directed, apply ice to the painful area:  Put ice in a plastic bag.  Place a towel between your skin and the bag.  Leave the ice on for 20 minutes, 2-3 times per day.  Take over-the-counter and prescription medicines only as told by your health care provider.  Do not use tobacco products, including cigarettes, chewing tobacco, and e-cigarettes. If you need help quitting, ask your health care provider.  Keep all follow-up visits as told by your health care provider. This is important. SEEK MEDICAL CARE IF:  You have a fever.  Your chest pain becomes worse.  You have new symptoms. SEEK IMMEDIATE MEDICAL CARE IF:  You have nausea or vomiting.  You feel sweaty or light-headed.  You have a cough with phlegm (sputum) or you cough up blood.  You develop shortness of breath.   This information is not intended to replace advice given to you by your health care provider. Make sure you discuss any questions you have with your health care provider.   Please follow up with your primary care provider for re-evaluation. Return to the ED if you experience severe worsening of your symptoms, difficulty breathing, dizziness, loss of consciousness.

## 2015-09-17 NOTE — ED Notes (Signed)
Patient here with central chest pain that he states is worse with inspiration and radiation to left arm. Denies trauma, denies cold symptoms

## 2015-09-19 ENCOUNTER — Encounter: Payer: Self-pay | Admitting: Family Medicine

## 2015-09-19 ENCOUNTER — Ambulatory Visit: Payer: Medicaid Other | Attending: Family Medicine | Admitting: Family Medicine

## 2015-09-19 VITALS — BP 132/87 | HR 69 | Temp 97.7°F | Ht 69.75 in | Wt 180.0 lb

## 2015-09-19 DIAGNOSIS — K219 Gastro-esophageal reflux disease without esophagitis: Secondary | ICD-10-CM

## 2015-09-19 DIAGNOSIS — Z794 Long term (current) use of insulin: Secondary | ICD-10-CM | POA: Diagnosis not present

## 2015-09-19 DIAGNOSIS — M549 Dorsalgia, unspecified: Secondary | ICD-10-CM

## 2015-09-19 DIAGNOSIS — M5126 Other intervertebral disc displacement, lumbar region: Secondary | ICD-10-CM

## 2015-09-19 DIAGNOSIS — E119 Type 2 diabetes mellitus without complications: Secondary | ICD-10-CM

## 2015-09-19 DIAGNOSIS — G8929 Other chronic pain: Secondary | ICD-10-CM

## 2015-09-19 DIAGNOSIS — E1149 Type 2 diabetes mellitus with other diabetic neurological complication: Secondary | ICD-10-CM

## 2015-09-19 LAB — POCT GLYCOSYLATED HEMOGLOBIN (HGB A1C): HEMOGLOBIN A1C: 7.1

## 2015-09-19 LAB — GLUCOSE, POCT (MANUAL RESULT ENTRY): POC GLUCOSE: 156 mg/dL — AB (ref 70–99)

## 2015-09-19 MED FILL — traMADol HCL 50 MG TABS: 50 | 20 days supply | Qty: 60 | Fill #1

## 2015-09-19 MED FILL — AMLODIPINE BESYLATE 2.5 MG: 2.5 | 30 days supply | Qty: 30 | Fill #0

## 2015-09-19 NOTE — Progress Notes (Signed)
Subjective:    Patient ID: David Wyatt, male    DOB: [birth year], 55 y.o.   MRN: [id number]  HPI He is a 55 year old male with a history of type 2 diabetes mellitus (A1c 6.6), diabetic neuropathy, hypertension, chronic low back pain (status post previous back surgery) who comes into the clinic for follow-up visit.  He continues to complain of low back pain and had been referred to pain management but he is yet to hear from them.  He has been compliant with his Lantus and adheres to a diabetic diet. He does have numbness in his feet for which he takes Neurontin. He has not had an annual eye exam in a while.  Reflux symptoms are controlled and he denies abdominal pain, diarrhea, nausea or vomiting.  Continues to smoke 2 cigarettes a day and is working on quitting. Past Medical History  Diagnosis Date  . Hypertension   . Cocaine abuse   . Type II diabetes mellitus ([place])   . Arthritis     "hands" (07/28/2012)  . Angioedema of lips 07/28/2012    left upper (07/29/2012)  . Neuropathy ([place])   . Chronic back pain     Past Surgical History  Procedure Laterality Date  . Inguinal hernia repair  04/01/2012    Procedure: HERNIA REPAIR INGUINAL ADULT;  Surgeon: Haywood Lasso, MD;  Location: [facility];  Service: General;  Laterality: Right;  . Hernia repair Right 04/01/2012  . Incision and drainage of wound      boil on back/notes 07/14/2008 (07/29/2012)  . I&d extremity Left 12/13/2012    Procedure: IRRIGATION AND DEBRIDEMENT LEFT THUMB;  Surgeon: Tennis Must, MD;  Location: [facility];  Service: Orthopedics;  Laterality: Left;    Allergies  Allergen Reactions  . Lisinopril Other (See Comments)    Lip Swelling    Current Outpatient Prescriptions on File Prior to Visit  Medication Sig Dispense Refill  . amLODipine (NORVASC) 2.5 MG tablet Take 1 tablet (2.5 mg total) by mouth daily. 30 tablet 2  . gabapentin (NEURONTIN) 300 MG capsule Take 1 capsule (300 mg total) by mouth 3 (three) times  daily. 90 capsule 2  . insulin aspart (NOVOLOG FLEXPEN) 100 UNIT/ML FlexPen Inject 0-15 Units into the skin 3 (three) times daily with meals. According to sliding scale 15 mL 3  . Insulin Glargine (LANTUS SOLOSTAR) 100 UNIT/ML Solostar Pen Inject 18 Units into the skin daily at 10 pm. 5 pen 3  . Insulin Pen Needle 31G X 5 MM MISC 1 each by Does not apply route 4 (four) times daily. 120 each 11  . metFORMIN (GLUCOPHAGE) 500 MG tablet Take 2 tablets (1,000 mg total) by mouth 2 (two) times daily with a meal. 120 tablet 3  . naproxen (NAPROSYN) 500 MG tablet Take 1 tablet (500 mg total) by mouth 2 (two) times daily. 30 tablet 0  . tiZANidine (ZANAFLEX) 4 MG tablet Take 1 tablet (4 mg total) by mouth every 8 (eight) hours as needed for muscle spasms. 90 tablet 2  . traMADol (ULTRAM) 50 MG tablet Take 1 tablet (50 mg total) by mouth every 8 (eight) hours as needed. (Patient taking differently: Take 50 mg by mouth every 8 (eight) hours as needed for moderate pain. ) 60 tablet 1  . naproxen (NAPROSYN) 500 MG tablet Take 1 tablet (500 mg total) by mouth 2 (two) times daily with a meal. (Patient not taking: Reported on 09/19/2015) 30 tablet 3   No current facility-administered medications  on file prior to visit.     Review of Systems  Constitutional: Negative for activity change and appetite change.  HENT: Negative for sinus pressure and sore throat.   Eyes: Negative for visual disturbance.  Respiratory: Negative for cough, chest tightness and shortness of breath.   Cardiovascular: Negative for chest pain and leg swelling.  Gastrointestinal: Negative for abdominal pain, diarrhea, constipation and abdominal distention.  Endocrine: Negative.   Genitourinary: Negative for dysuria.  Musculoskeletal: Positive for back pain. Negative for myalgias and joint swelling.  Skin: Negative for rash.  Allergic/Immunologic: Negative.   Neurological: Negative for weakness, light-headedness and numbness.    Psychiatric/Behavioral: Negative for suicidal ideas and dysphoric mood.       Objective: Filed Vitals:   09/19/15 1203  BP: 132/87  Pulse: 69  Temp: 97.7 F (36.5 C)  TempSrc: Oral  Height: 5' 9.75" (1.772 m)  Weight: 180 lb (81.647 kg)      Physical Exam  Constitutional: He is oriented to person, place, and time. He appears well-developed and well-nourished.  Cardiovascular: Normal rate, normal heart sounds and intact distal pulses.   No murmur heard. Pulmonary/Chest: Effort normal and breath sounds normal. He has no wheezes. He has no rales. He exhibits no tenderness.  Abdominal: Soft. Bowel sounds are normal. He exhibits no distension and no mass. There is no tenderness.  Musculoskeletal: Normal range of motion. Vertical lumbar surgical scar He exhibits tenderness (on palpation of lumbar spine; positive straight leg raise on the right).  Neurological: He is alert and oriented to person, place, and time.      Assessment & Plan:  1. Controlled type 2 diabetes mellitus with complication, without long-term current use of insulin (HCC) Controlled with A1c of 7.1 Continue medication - Glucose (CBG) - HgB A1c  2. Chronic back pain No red flags - traMADol (ULTRAM) 50 MG tablet; Take 1 tablet (50 mg total) by mouth every 8 (eight) hours as needed.  Dispense: 60 tablet; Refill: 1 - Ambulatory referral to Pain Clinic Provided phone number to [facility] so he can call them as he is yet to hear from them.  3. Lumbar disc herniation S/p back surgery He will be getting in touch with his Surgeon in [place] for an office visit. - Ambulatory referral to Pain Clinic  4. Diabetic neuropathy Continue gabapentin  5. GERD Controlled  6. Hypertension Controlled on amlodipine.  7. Tobacco abuse Spent 3 minutes counseling on cessation and he is working on quitting.

## 2015-09-20 ENCOUNTER — Telehealth: Payer: Self-pay | Admitting: Family Medicine

## 2015-09-20 NOTE — Telephone Encounter (Signed)
Patient needs letter stating that hes unable to walk up stairs and needs to use elevator. Patient needs asap Please follow up.

## 2015-09-21 DIAGNOSIS — Z139 Encounter for screening, unspecified: Secondary | ICD-10-CM

## 2015-09-22 ENCOUNTER — Encounter: Payer: Self-pay | Admitting: Family Medicine

## 2015-09-22 NOTE — Telephone Encounter (Signed)
Letter is ready for pick-up.

## 2015-09-22 NOTE — Telephone Encounter (Signed)
Called patient and informed letter ready for pick up at the front desk. Patient was very grateful.

## 2015-10-03 ENCOUNTER — Telehealth: Payer: Self-pay | Admitting: Family Medicine

## 2015-10-03 DIAGNOSIS — Z139 Encounter for screening, unspecified: Secondary | ICD-10-CM

## 2015-10-03 NOTE — Telephone Encounter (Signed)
Patient is needing a glucometer. Please follow up.

## 2015-10-04 MED ORDER — GLUCOSE BLOOD VI STRP: ORAL_STRIP | 12 refills | Status: DC

## 2015-10-04 MED ORDER — ACCU-CHEK SOFTCLIX LANCET DEV MISC: 0 refills | Status: DC

## 2015-10-04 MED ORDER — ACCU-CHEK SOFTCLIX LANCETS MISC
12 refills | Status: DC
Start: 2015-10-04 — End: 2015-11-08

## 2015-10-04 MED ORDER — ACCU-CHEK AVIVA PLUS W/DEVICE KIT: PACK | 0 refills | Status: DC

## 2015-10-04 MED FILL — ACCU-CHEK SOFTCLIX LANCETS: 25 days supply | Qty: 100 | Fill #0

## 2015-10-04 MED FILL — ACCU-CHEK AVIVA PLUS TEST S: 25 days supply | Qty: 100 | Fill #0

## 2015-10-04 MED FILL — ACCU-CHEK AVIVA PLUS METER: W/DEVICE | 1 days supply | Qty: 1 | Fill #0

## 2015-10-04 NOTE — Telephone Encounter (Signed)
Patient with Medicaid, Accu-chek blood glucose monitoring supplies ordered.

## 2015-10-05 DIAGNOSIS — Z139 Encounter for screening, unspecified: Secondary | ICD-10-CM

## 2015-10-13 LAB — GLUCOSE, POCT (MANUAL RESULT ENTRY)
POC Glucose: 163 mg/dl — AB (ref 70–99)
POC Glucose: 156 mg/dl — AB (ref 70–99)
POC Glucose: 250 mg/dl — AB (ref 70–99)

## 2015-10-13 NOTE — Congregational Nurse Program (Signed)
Congregational Nurse Program Note  Date of Encounter: 09/21/2015  Past Medical History: Past Medical History:  Diagnosis Date  . Angioedema of lips 07/28/2012   left upper (07/29/2012)  . Arthritis    "hands" (07/28/2012)  . Chronic back pain   . Cocaine abuse   . Hypertension   . Neuropathy ([place])   . Type II diabetes mellitus [facility])     Encounter Details:     CNP Questionnaire - 10/13/15 2053      Patient Demographics   Is this a new or existing patient? New   Patient is considered a/an Not Applicable   Race African-American/Black     Patient Assistance   Location of Patient Assistance Not Applicable   Patient's financial/insurance status Medicaid;Low Income   Patient referred to apply for the following financial assistance Not Applicable   Food insecurities addressed Provided food supplies   Transportation assistance No   Assistance securing medications No   Educational health offerings Medications;Diabetes;Navigating the healthcare system     Encounter Details   Primary purpose of visit Chronic Illness/Condition Visit;Education/Health Concerns;Other   Was an Emergency Department visit averted? Not Applicable   Does patient have a medical provider? Yes   Patient referred to Not Applicable   Was a mental health screening completed? (GAINS tool) No   Does patient have dental issues? No   Does patient have vision issues? No   Does your patient have an abnormal blood pressure today? No   Since previous encounter, have you referred patient for abnormal blood pressure that resulted in a new diagnosis or medication change? No   Does your patient have an abnormal blood glucose today? Yes   Since previous encounter, have you referred patient for abnormal blood glucose that resulted in a new diagnosis or medication change? No   Was there a life-saving intervention made? No         Amb Nursing Assessment - 09/19/15 1206      Pre-visit preparation   Pre-visit preparation  completed Yes     Pain Assessment   Pain Score 5    Pain Type Chronic pain   Pain Location Back   Pain Descriptors / Indicators Aching   Pain Onset More than a month ago   Pain Frequency Constant     Nutrition Screen   Diabetes Yes   CBG done? Yes   CBG resulted in Enter/ Edit results? Yes   Did pt. bring in CBG monitor from home? No     Functional Status   Activities of Daily Living Independent   Ambulation Independent   Medication Administration Independent   Home Management Independent     Abuse/Neglect Assessment   Do you feel unsafe in your current relationship? No   Do you feel physically threatened by others? No   Anyone hurting you at home, work, or school? No   Unable to ask? No     Patient Literacy   How often do you need to have someone help you when you read instructions, pamphlets, or other written materials from your doctor or pharmacy? 1 - Never     Investment banker, operational Needed? No     States is on a sliding scale insulin.  Does not have a glucometer.  [facility] to arrange for client to receive a glucometer

## 2015-10-13 NOTE — Congregational Nurse Program (Signed)
Congregational Nurse Program Note  Date of Encounter: 10/03/2015  Past Medical History: Past Medical History:  Diagnosis Date  . Angioedema of lips 07/28/2012   left upper (07/29/2012)  . Arthritis    "hands" (07/28/2012)  . Chronic back pain   . Cocaine abuse   . Hypertension   . Neuropathy ([place])   . Type II diabetes mellitus [facility])     Encounter Details:     CNP Questionnaire - 10/13/15 2053      Patient Demographics   Is this a new or existing patient? New   Patient is considered a/an Not Applicable   Race African-American/Black     Patient Assistance   Location of Patient Assistance Not Applicable   Patient's financial/insurance status Medicaid;Low Income   Uninsured Patient No   Patient referred to apply for the following financial assistance Not Applicable   Food insecurities addressed Provided food supplies   Transportation assistance No   Assistance securing medications No   Educational health offerings Medications;Diabetes;Navigating the healthcare system     Encounter Details   Primary purpose of visit Chronic Illness/Condition Visit;Education/Health Concerns;Other   Was an Emergency Department visit averted? Not Applicable   Does patient have a medical provider? Yes   Patient referred to Not Applicable   Was a mental health screening completed? (GAINS tool) No   Does patient have dental issues? No   Does patient have vision issues? No   Does your patient have an abnormal blood pressure today? No   Since previous encounter, have you referred patient for abnormal blood pressure that resulted in a new diagnosis or medication change? No   Does your patient have an abnormal blood glucose today? Yes   Since previous encounter, have you referred patient for abnormal blood glucose that resulted in a new diagnosis or medication change? No   Was there a life-saving intervention made? No         Amb Nursing Assessment - 09/19/15 1206      Pre-visit preparation   Pre-visit preparation completed Yes     Pain Assessment   Pain Score 5    Pain Type Chronic pain   Pain Location Back   Pain Descriptors / Indicators Aching   Pain Onset More than a month ago   Pain Frequency Constant     Nutrition Screen   Diabetes Yes   CBG done? Yes   CBG resulted in Enter/ Edit results? Yes   Did pt. bring in CBG monitor from home? No     Functional Status   Activities of Daily Living Independent   Ambulation Independent   Medication Administration Independent   Home Management Independent     Abuse/Neglect Assessment   Do you feel unsafe in your current relationship? No   Do you feel physically threatened by others? No   Anyone hurting you at home, work, or school? No   Unable to ask? No     Patient Literacy   How often do you need to have someone help you when you read instructions, pamphlets, or other written materials from your doctor or pharmacy? 1 - Never     Investment banker, operational Needed? No     CBG checked.  Still waiting on glucometer.

## 2015-10-13 NOTE — Congregational Nurse Program (Signed)
Congregational Nurse Program Note  Date of Encounter: 10/05/2015  Past Medical History: Past Medical History:  Diagnosis Date  . Angioedema of lips 07/28/2012   left upper (07/29/2012)  . Arthritis    "hands" (07/28/2012)  . Chronic back pain   . Cocaine abuse   . Hypertension   . Neuropathy ([place])   . Type II diabetes mellitus [facility])     Encounter Details:     CNP Questionnaire - 10/13/15 2053      Patient Demographics   Is this a new or existing patient? New   Patient is considered a/an Not Applicable   Race African-American/Black     Patient Assistance   Location of Patient Assistance Not Applicable   Patient's financial/insurance status Medicaid;Low Income   Uninsured Patient No   Patient referred to apply for the following financial assistance Not Applicable   Food insecurities addressed Provided food supplies   Transportation assistance No   Assistance securing medications No   Educational health offerings Medications;Diabetes;Navigating the healthcare system     Encounter Details   Primary purpose of visit Chronic Illness/Condition Visit;Education/Health Concerns;Other   Was an Emergency Department visit averted? Not Applicable   Does patient have a medical provider? Yes   Patient referred to Not Applicable   Was a mental health screening completed? (GAINS tool) No   Does patient have dental issues? No   Does patient have vision issues? No   Does your patient have an abnormal blood pressure today? No   Since previous encounter, have you referred patient for abnormal blood pressure that resulted in a new diagnosis or medication change? No   Does your patient have an abnormal blood glucose today? Yes   Since previous encounter, have you referred patient for abnormal blood glucose that resulted in a new diagnosis or medication change? No   Was there a life-saving intervention made? No         Amb Nursing Assessment - 09/19/15 1206      Pre-visit preparation   Pre-visit preparation completed Yes     Pain Assessment   Pain Score 5    Pain Type Chronic pain   Pain Location Back   Pain Descriptors / Indicators Aching   Pain Onset More than a month ago   Pain Frequency Constant     Nutrition Screen   Diabetes Yes   CBG done? Yes   CBG resulted in Enter/ Edit results? Yes   Did pt. bring in CBG monitor from home? No     Functional Status   Activities of Daily Living Independent   Ambulation Independent   Medication Administration Independent   Home Management Independent     Abuse/Neglect Assessment   Do you feel unsafe in your current relationship? No   Do you feel physically threatened by others? No   Anyone hurting you at home, work, or school? No   Unable to ask? No     Patient Literacy   How often do you need to have someone help you when you read instructions, pamphlets, or other written materials from your doctor or pharmacy? 1 - Never     Investment banker, operational Needed? No     CBG  250 random.  Client has received his glucometer.  Reviewed with him the use of the monitor.

## 2015-10-19 ENCOUNTER — Telehealth: Payer: Self-pay | Admitting: Family Medicine

## 2015-10-19 DIAGNOSIS — M549 Dorsalgia, unspecified: Principal | ICD-10-CM

## 2015-10-19 DIAGNOSIS — Z139 Encounter for screening, unspecified: Secondary | ICD-10-CM

## 2015-10-19 DIAGNOSIS — E119 Type 2 diabetes mellitus without complications: Secondary | ICD-10-CM

## 2015-10-19 DIAGNOSIS — I10 Essential (primary) hypertension: Secondary | ICD-10-CM

## 2015-10-19 DIAGNOSIS — G8929 Other chronic pain: Secondary | ICD-10-CM

## 2015-10-19 LAB — GLUCOSE, POCT (MANUAL RESULT ENTRY): POC Glucose: 192 mg/dl — AB (ref 70–99)
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
## 2015-10-20 ENCOUNTER — Other Ambulatory Visit: Payer: Self-pay | Admitting: Family Medicine

## 2015-10-20 DIAGNOSIS — G8929 Other chronic pain: Secondary | ICD-10-CM

## 2015-10-20 DIAGNOSIS — M549 Dorsalgia, unspecified: Principal | ICD-10-CM

## 2015-10-20 MED ORDER — METFORMIN HCL 500 MG PO TABS: 1000.0000 mg | ORAL_TABLET | Freq: Two times a day (BID) | ORAL | 3 refills | Status: DC

## 2015-10-20 MED ORDER — TRAMADOL HCL 50 MG PO TABS
50.0000 mg | ORAL_TABLET | Freq: Three times a day (TID) | ORAL | 1 refills | Status: DC | PRN
Start: 2015-10-20 — End: 2016-01-03

## 2015-10-20 MED ORDER — GABAPENTIN 300 MG PO CAPS: 300.0000 mg | ORAL_CAPSULE | Freq: Three times a day (TID) | ORAL | 2 refills | Status: DC

## 2015-10-20 MED ORDER — AMLODIPINE BESYLATE 2.5 MG PO TABS: 2.5000 mg | ORAL_TABLET | Freq: Every day | ORAL | 2 refills | Status: DC

## 2015-10-20 MED ORDER — TIZANIDINE HCL 4 MG PO TABS: 4.0000 mg | ORAL_TABLET | Freq: Three times a day (TID) | ORAL | 2 refills | Status: DC | PRN

## 2015-10-20 MED FILL — AMLODIPINE BESYLATE 2.5 MG: 2.5 | 30 days supply | Qty: 30 | Fill #0

## 2015-10-20 MED FILL — GABAPENTIN 300 MG CAPSULE: 300 | 30 days supply | Qty: 90 | Fill #2

## 2015-10-20 MED FILL — metFORMIN HCL 500 MG TABS: 500 | 30 days supply | Qty: 120 | Fill #2

## 2015-10-20 NOTE — Telephone Encounter (Signed)
Patient called by Probation officer and informed that his medications have been filled including his tramadol.

## 2015-10-20 NOTE — Telephone Encounter (Signed)
Done

## 2015-10-30 ENCOUNTER — Encounter: Payer: Self-pay | Admitting: Pediatric Intensive Care

## 2015-10-30 DIAGNOSIS — Z139 Encounter for screening, unspecified: Secondary | ICD-10-CM

## 2015-10-30 LAB — GLUCOSE, POCT (MANUAL RESULT ENTRY): POC Glucose: 144 mg/dl — AB (ref 70–99)

## 2015-11-06 ENCOUNTER — Encounter: Payer: Self-pay | Admitting: Pediatric Intensive Care

## 2015-11-06 DIAGNOSIS — Z139 Encounter for screening, unspecified: Secondary | ICD-10-CM

## 2015-11-06 LAB — GLUCOSE, POCT (MANUAL RESULT ENTRY): POC GLUCOSE: 101 mg/dL — AB (ref 70–99)

## 2015-11-06 NOTE — Congregational Nurse Program (Signed)
Congregational Nurse Program Note  Date of Encounter: 10/30/2015  Past Medical History: Past Medical History:  Diagnosis Date  . Angioedema of lips 07/28/2012   left upper (07/29/2012)  . Arthritis    "hands" (07/28/2012)  . Chronic back pain   . Cocaine abuse   . Hypertension   . Neuropathy ([place])   . Type II diabetes mellitus ([place])     Encounter Details:     CNP Questionnaire - 10/30/15 0930      Patient Demographics   Is this a new or existing patient? New   Patient is considered a/an Not Applicable   Race African-American/Black     Patient Assistance   Location of Patient Assistance [facility]   Patient's financial/insurance status Medicaid;Low Income   Uninsured Patient No   Patient referred to apply for the following financial assistance Not Applicable   Food insecurities addressed Not Applicable   Transportation assistance No   Assistance securing medications No   Educational health offerings Behavioral health;Diabetes;Medications;Navigating the healthcare system     Encounter Details   Primary purpose of visit Chronic Illness/Condition Visit;Education/Health Concerns;Navigating the Healthcare System   Was an Emergency Department visit averted? Not Applicable   Does patient have a medical provider? Yes   Patient referred to Not Applicable   Was a mental health screening completed? (GAINS tool) No   Does patient have dental issues? No   Does patient have vision issues? No   Does your patient have an abnormal blood pressure today? No   Since previous encounter, have you referred patient for abnormal blood pressure that resulted in a new diagnosis or medication change? No   Does your patient have an abnormal blood glucose today? No   Since previous encounter, have you referred patient for abnormal blood glucose that resulted in a new diagnosis or medication change? No   Was there a life-saving intervention made? No     S:Client requests blood pressure, blood sugar check.  States that he has recently seen pain management for chronic back pain and has been prescribed suboxone. He states he is afraid to take suboxone because his friends tell him it's for people with addiction and he doesn't do heroin. O: BP 138/82 BS 144 2 hours PC.  A: K[place]ledge deficit R/T medication for pain management P: Discussed use of suboxone for his pain management. Assured Client that it would not have been prescribed if not appropriate. Advised Client to keep upcoming pain management appointment and to take suboxone as directed.

## 2015-11-06 NOTE — Congregational Nurse Program (Signed)
Congregational Nurse Program Note  Date of Encounter: 11/06/2015  Past Medical History: Past Medical History:  Diagnosis Date  . Angioedema of lips 07/28/2012   left upper (07/29/2012)  . Arthritis    "hands" (07/28/2012)  . Chronic back pain   . Cocaine abuse   . Hypertension   . Neuropathy ([place])   . Type II diabetes mellitus ([place])     Encounter Details:     CNP Questionnaire - 11/06/15 0915      Patient Demographics   Is this a new or existing patient? Existing   Patient is considered a/an Not Applicable   Race African-American/Black     Patient Assistance   Location of Patient Assistance [facility]   Patient's financial/insurance status Medicaid;Low Income   Uninsured Patient No   Patient referred to apply for the following financial assistance Not Applicable   Food insecurities addressed Not Applicable   Transportation assistance No   Assistance securing medications No   Educational health offerings Medications;Navigating the healthcare system     Encounter Details   Primary purpose of visit Chronic Illness/Condition Visit;Education/Health Concerns   Was an Emergency Department visit averted? Not Applicable   Does patient have a medical provider? Yes   Patient referred to Not Applicable   Was a mental health screening completed? (GAINS tool) No   Does patient have dental issues? No   Does patient have vision issues? No   Does your patient have an abnormal blood pressure today? No   Since previous encounter, have you referred patient for abnormal blood pressure that resulted in a new diagnosis or medication change? No   Does your patient have an abnormal blood glucose today? No   Since previous encounter, have you referred patient for abnormal blood glucose that resulted in a new diagnosis or medication change? No   Was there a life-saving intervention made? No     S: Client requests BP/BG screen. States that he has suboxone rx and has been taking it but not according to  the directions. States that he will go to his mothers house to rest but would like bedrest note for facility. Also states that his neuropathy has been very bad recently. States that he had gone to PT appointment as outpatient rehab. O:BP 138/88 BG 101 2 hr PC A: Continued pain R/T back injury/surgery P: CN to provide intermittent bedrest note to facility for 1 week to re-eval after pain management appointment. Client to take suboxone according to rx. CN to contact [facility] for PT appointment.

## 2015-11-07 ENCOUNTER — Encounter: Payer: Self-pay | Admitting: Pediatric Intensive Care

## 2015-11-07 DIAGNOSIS — Z139 Encounter for screening, unspecified: Secondary | ICD-10-CM

## 2015-11-08 ENCOUNTER — Emergency Department (HOSPITAL_COMMUNITY)
Admission: EM | Admit: 2015-11-08 | Discharge: 2015-11-08 | Disposition: A | Discharge status: Home / Self Care | Payer: Medicaid Other | Attending: Emergency Medicine | Admitting: Emergency Medicine

## 2015-11-08 ENCOUNTER — Encounter (HOSPITAL_COMMUNITY): Payer: Self-pay

## 2015-11-08 DIAGNOSIS — F17210 Nicotine dependence, cigarettes, uncomplicated: Secondary | ICD-10-CM | POA: Diagnosis not present

## 2015-11-08 DIAGNOSIS — K529 Noninfective gastroenteritis and colitis, unspecified: Secondary | ICD-10-CM

## 2015-11-08 DIAGNOSIS — E119 Type 2 diabetes mellitus without complications: Secondary | ICD-10-CM | POA: Diagnosis not present

## 2015-11-08 DIAGNOSIS — Z79899 Other long term (current) drug therapy: Secondary | ICD-10-CM | POA: Diagnosis not present

## 2015-11-08 DIAGNOSIS — R197 Diarrhea, unspecified: Secondary | ICD-10-CM | POA: Diagnosis present

## 2015-11-08 DIAGNOSIS — I10 Essential (primary) hypertension: Secondary | ICD-10-CM | POA: Insufficient documentation

## 2015-11-08 DIAGNOSIS — Z7984 Long term (current) use of oral hypoglycemic drugs: Secondary | ICD-10-CM | POA: Insufficient documentation

## 2015-11-08 DIAGNOSIS — Z794 Long term (current) use of insulin: Secondary | ICD-10-CM | POA: Insufficient documentation

## 2015-11-08 DIAGNOSIS — R339 Retention of urine, unspecified: Secondary | ICD-10-CM

## 2015-11-08 LAB — LIPASE, BLOOD: LIPASE: 18 U/L (ref 11–51)

## 2015-11-08 LAB — COMPREHENSIVE METABOLIC PANEL
ALT: 9 U/L — ABNORMAL LOW (ref 17–63)
ANION GAP: 7 (ref 5–15)
AST: 15 U/L (ref 15–41)
Albumin: 4.1 g/dL (ref 3.5–5.0)
Alkaline Phosphatase: 95 U/L (ref 38–126)
BUN: 15 mg/dL (ref 6–20)
CALCIUM: 9.3 mg/dL (ref 8.9–10.3)
CHLORIDE: 104 mmol/L (ref 101–111)
CO2: 28 mmol/L (ref 22–32)
CREATININE: 0.94 mg/dL (ref 0.61–1.24)
GFR calc Af Amer: >60 mL/min (ref >60)
Glucose, Bld: 113 mg/dL — ABNORMAL HIGH (ref 65–99)
POTASSIUM: 3.9 mmol/L (ref 3.5–5.1)
Sodium: 139 mmol/L (ref 135–145)
Total Bilirubin: 0.6 mg/dL (ref 0.3–1.2)
Total Protein: 8.1 g/dL (ref 6.5–8.1)

## 2015-11-08 LAB — URINALYSIS, ROUTINE W REFLEX MICROSCOPIC
Bilirubin Urine: NEGATIVE
Glucose, UA: NEGATIVE mg/dL
HGB URINE DIPSTICK: NEGATIVE
KETONES UR: NEGATIVE mg/dL
LEUKOCYTES UA: NEGATIVE
Nitrite: NEGATIVE
PROTEIN: NEGATIVE mg/dL
Specific Gravity, Urine: 1.025 (ref 1.005–1.030)
pH: 6.5 (ref 5.0–8.0)

## 2015-11-08 LAB — CBC
HCT: 42.4 % (ref 39.0–52.0)
Hemoglobin: 14 g/dL (ref 13.0–17.0)
MCH: 30.7 pg (ref 26.0–34.0)
MCHC: 33 g/dL (ref 30.0–36.0)
MCV: 93 fL (ref 78.0–100.0)
PLATELETS: 377 10*3/uL (ref 150–400)
RBC: 4.56 MIL/uL (ref 4.22–5.81)
RDW: 13.9 % (ref 11.5–15.5)
WBC: 8.3 10*3/uL (ref 4.0–10.5)

## 2015-11-08 LAB — CBG MONITORING, ED
GLUCOSE-CAPILLARY: 110 mg/dL — AB (ref 65–99)
Glucose-Capillary: 114 mg/dL — ABNORMAL HIGH (ref 65–99)

## 2015-11-08 MED ORDER — ONDANSETRON 4 MG PO TBDP: 4.0000 mg | ORAL_TABLET | Freq: Three times a day (TID) | ORAL | 0 refills | Status: DC | PRN

## 2015-11-08 NOTE — ED Triage Notes (Signed)
Patient states that he has vomited about 4-5 times and had 1 diarrhea BM in past 24 hours.

## 2015-11-08 NOTE — ED Notes (Signed)
Bed: [facility]1 Expected date:  Expected time:  Means of arrival:  Comments: 

## 2015-11-08 NOTE — ED Notes (Signed)
Bladder Scan:  <477 ML

## 2015-11-08 NOTE — Discharge Instructions (Signed)
Continue taking your home medications as prescribed. Continue to drink fluids at home to remain hydrated. I recommend eating bland foods for the next 2 days until her symptoms have improved. Follow-up with your primary care provider in the next week for follow-up. I recommend calling the urology clinic listed above to schedule a follow-up appointment in the next week regarding your urinary retention. Please return to the Emergency Department if symptoms worsen or new onset of fever, abdominal pain, vomiting, unable to keep fluids down, blood in emesis or stool, unable to urinate, pain with urination, blood in urine, back/flank pain, rectal pain.

## 2015-11-08 NOTE — ED Provider Notes (Signed)
[facility] DEPT Provider Note   CSN: [id number] Arrival date & time: 11/08/15  0826     History   Chief Complaint Chief Complaint  Patient presents with  . Emesis  . Urinary Retention  . Diarrhea    HPI David Wyatt is a 55 y.o. male.  Patient is a 55 year old male past medical history of hypertension, cocaine abuse and diabetes who presents the ED with multiple complaints. Patient reports he has had nausea and 4 episodes of NBNB vomiting daily over the past 3 days. He also notes he had 4 episodes of nonbloody diarrhea yesterday. Patient states he has also had difficulty urinating. He notes he has not been able to urinate since yesterday morning but notes he has not been eating or drinking anything due to his N/V. Patient reports "I feel like I need to urinate I just can't go". Denies fever, chills, headache, lightheadedness, dizziness, cough, shortness of breath, chest pain, abdominal pain, hemoptysis, hematochezia, urinary symptoms, penile discharge, penile or testicular pain or swelling, rectal pain. Denies any recent antibiotic use. Denies any recent travel outside of the [place] or drinking from fresh water sources. Patient reports he currently is staying at a shelter. Denies any known sick contacts.      Past Medical History:  Diagnosis Date  . Angioedema of lips 07/28/2012   left upper (07/29/2012)  . Arthritis    "hands" (07/28/2012)  . Chronic back pain   . Cocaine abuse   . Hypertension   . Neuropathy ([place])   . Type II diabetes mellitus [facility])     Patient Active Problem List   Diagnosis Date Noted  . GERD (gastroesophageal reflux disease) 06/13/2015  . Controlled type 2 diabetes mellitus with complication, without long-term current use of insulin ([place]) 05/01/2015  . Diabetic neuropathy ([place]) 05/01/2015  . Essential hypertension, benign 11/01/2013  . Lumbar disc herniation 09/13/2013  . Unspecified constipation 09/13/2013  . Diabetes mellitus due to underlying  condition without complications ([place]) [id number]  . Chronic back pain 09/13/2013  . Viral gastroenteritis 05/05/2013  . Hypoglycemia 05/05/2013  . Felon of finger of left hand with lymphangitis 12/13/2012  . Hyperglycemia 07/29/2012  . Angioedema of lips 07/29/2012  . Crack cocaine use 07/29/2012  . Arthritis 07/29/2012  . Weakness generalized 07/29/2012    Past Surgical History:  Procedure Laterality Date  . HERNIA REPAIR Right 04/01/2012  . I&D EXTREMITY Left 12/13/2012   Procedure: IRRIGATION AND DEBRIDEMENT LEFT THUMB;  Surgeon: Tennis Must, MD;  Location: [place];  Service: Orthopedics;  Laterality: Left;  . INCISION AND DRAINAGE OF WOUND     boil on back/notes 07/14/2008 (07/29/2012)  . INGUINAL HERNIA REPAIR  04/01/2012   Procedure: HERNIA REPAIR INGUINAL ADULT;  Surgeon: Haywood Lasso, MD;  Location: [place];  Service: General;  Laterality: Right;       Home Medications    Prior to Admission medications   Medication Sig Start Date End Date Taking? Authorizing Provider  amLODipine (NORVASC) 2.5 MG tablet Take 1 tablet (2.5 mg total) by mouth daily. 10/20/15  Yes Arnoldo Morale, MD  buprenorphine-naloxone (SUBOXONE) 2-0.5 MG SUBL SL tablet Place 1 tablet under the tongue 2 (two) times daily.   Yes Historical Provider, MD  gabapentin (NEURONTIN) 300 MG capsule Take 1 capsule (300 mg total) by mouth 3 (three) times daily. 10/20/15  Yes Arnoldo Morale, MD  insulin aspart (NOVOLOG FLEXPEN) 100 UNIT/ML FlexPen Inject 0-15 Units into the skin 3 (three) times daily with meals. According to  sliding scale 08/31/15  Yes Arnoldo Morale, MD  Insulin Glargine (LANTUS SOLOSTAR) 100 UNIT/ML Solostar Pen Inject 18 Units into the skin daily at 10 pm. 08/31/15  Yes Arnoldo Morale, MD  metFORMIN (GLUCOPHAGE) 500 MG tablet Take 2 tablets (1,000 mg total) by mouth 2 (two) times daily with a meal. 10/20/15  Yes Arnoldo Morale, MD  naproxen (NAPROSYN) 500 MG tablet Take 1 tablet (500 mg total) by mouth 2 (two)  times daily. 09/17/15  Yes Samantha Tripp Dowless, PA-C  tiZANidine (ZANAFLEX) 4 MG tablet Take 1 tablet (4 mg total) by mouth every 8 (eight) hours as needed for muscle spasms. 10/20/15  Yes Arnoldo Morale, MD  traMADol (ULTRAM) 50 MG tablet Take 1 tablet (50 mg total) by mouth every 8 (eight) hours as needed for moderate pain. 10/20/15  Yes Arnoldo Morale, MD    Family History Family History  Problem Relation Age of Onset  . Diabetes Father     Social History Social History  Substance Use Topics  . Smoking status: Current Every Day Smoker    Packs/day: 0.25    Years: 30.00    Types: Cigarettes    Last attempt to quit: 08/15/2015  . Smokeless tobacco: Former Systems developer    Quit date: 08/15/2015     Comment: Has not had a cig in one week  . Alcohol use No     Allergies   Lisinopril   Review of Systems Review of Systems  Gastrointestinal: Positive for diarrhea and nausea.  Genitourinary: Positive for difficulty urinating.  All other systems reviewed and are negative.    Physical Exam Updated Vital Signs SpO2 94%   Physical Exam  Constitutional: He is oriented to person, place, and time. He appears well-developed and well-nourished. No distress.  HENT:  Head: Normocephalic and atraumatic.  Mouth/Throat: Uvula is midline, oropharynx is clear and moist and mucous membranes are normal. No oropharyngeal exudate, posterior oropharyngeal edema, posterior oropharyngeal erythema or tonsillar abscesses. No tonsillar exudate.  Eyes: Conjunctivae and EOM are normal. Right eye exhibits no discharge. Left eye exhibits no discharge. No scleral icterus.  Neck: Normal range of motion. Neck supple.  Cardiovascular: Normal rate, regular rhythm, normal heart sounds and intact distal pulses.   Pulmonary/Chest: Effort normal and breath sounds normal. No respiratory distress. He has no wheezes. He has no rales. He exhibits no tenderness.  Abdominal: Soft. Bowel sounds are normal. He exhibits no distension  and no mass. There is no tenderness. There is no rebound and no guarding. No hernia. Hernia confirmed negative in the right inguinal area and confirmed negative in the left inguinal area.  No CVA tenderness  Genitourinary: Testes normal and penis normal. Circumcised. No phimosis, paraphimosis, hypospadias, penile erythema or penile tenderness. No discharge found.  Musculoskeletal: Normal range of motion. He exhibits no edema.  Lymphadenopathy: No inguinal adenopathy noted on the right or left side.  Neurological: He is alert and oriented to person, place, and time.  Skin: Skin is warm and dry. He is not diaphoretic.  Nursing note and vitals reviewed.    ED Treatments / Results  Labs (all labs ordered are listed, but only abnormal results are displayed) Labs Reviewed  CBG MONITORING, ED - Abnormal; Notable for the following:       Result Value   Glucose-Capillary 110 (*)    All other components within normal limits  CBG MONITORING, ED - Abnormal; Notable for the following:    Glucose-Capillary 114 (*)    All other components within  normal limits  CBC  LIPASE, BLOOD  COMPREHENSIVE METABOLIC PANEL  URINALYSIS, ROUTINE W REFLEX MICROSCOPIC (NOT AT [facility])    EKG  EKG Interpretation None       Radiology No results found.  Procedures Procedures (including critical care time)  Medications Ordered in ED Medications - No data to display   Initial Impression / Assessment and Plan / ED Course  I have reviewed the triage vital signs and the nursing notes.  Pertinent labs & imaging results that were available during my care of the patient were reviewed by me and considered in my medical decision making (see chart for details).  Clinical Course   Patient presents with nausea, vomiting and diarrhea that started 3 days ago. He notes he has been eating and drinking less due to his symptoms. Patient also complains of urinary retention and states he last urinated yesterday afternoon.  Denies fever, abdominal pain or dysuria. VSS. Exam unremarkable, no abdominal tenderness, no CVA tenderness. Moist mucous membranes, patient appears well-hydrated. Pt declined antiemetics and reports he does not have any sxs while sitting in bed.   Labs unremarkable. On reevaluation patient reports she is still unable to urinate. Bladder scan performed by nurse revealed <477. Discussed with pt that if he is unable to urinate we will need to cath him. Nurse reported that patient was able to urinate on his own with standing upright. Urine unremarkable. On reevaluation patient denies any pain or complaints at this time. Patient able to tolerate PO. Suspect pt's GI sxs are likely due to viral gastroenteritis. I do not suspect appendicitis, bowel obstruction, diverticulitis, perforation, or other acute surgical abdomen and do not feel that any further workup or imaging is warranted at this time. Plan to d/c pt home with symptomatic tx. Discussed results and plan for discharge with patient. Patient given information to follow up with urology regarding his urinary retention. Advised patient to follow up with his PCP as needed. Discussed return precautions with patient.  Final Clinical Impressions(s) / ED Diagnoses   Final diagnoses:  None    New Prescriptions New Prescriptions   No medications on file     Nona Dell, PA-C 11/08/15 [place], MD 11/08/15 [phone]

## 2015-11-08 NOTE — ED Triage Notes (Signed)
Per EMS, Pt, from [facility], c/o n/v and difficulty urinating x 1 day.  Pt is currently sleeping in Triage chair.  Pt was assessed by RN at facility and she reported to EMS that the Pt has been taking double his prescribed dose of Suboxone.  No emesis noted en route.

## 2015-11-08 NOTE — ED Notes (Signed)
Bed: WEMS01 Expected date:  Expected time:  Means of arrival:  Comments: EMS- 55yo M, n/v/dysuria

## 2015-11-10 NOTE — Congregational Nurse Program (Signed)
Congregational Nurse Program Note  Date of Encounter: 11/07/2015  Past Medical History: Past Medical History:  Diagnosis Date  . Angioedema of lips 07/28/2012   left upper (07/29/2012)  . Arthritis    "hands" (07/28/2012)  . Chronic back pain   . Cocaine abuse   . Hypertension   . Neuropathy ([place])   . Type II diabetes mellitus ([place])     Encounter Details:     CNP Questionnaire - 11/07/15 0830      Patient Demographics   Is this a new or existing patient? Existing   Patient is considered a/an Not Applicable   Race African-American/Black     Patient Assistance   Location of Patient Assistance [facility]   Patient's financial/insurance status Medicaid;Low Income   Uninsured Patient No   Patient referred to apply for the following financial assistance Not Applicable   Food insecurities addressed Not Applicable   Transportation assistance No   Assistance securing medications No   Educational health offerings Medications;Navigating the healthcare system     Encounter Details   Primary purpose of visit Chronic Illness/Condition Visit;Navigating the Healthcare System   Was an Emergency Department visit averted? Not Applicable   Does patient have a medical provider? Yes   Patient referred to Not Applicable   Was a mental health screening completed? (GAINS tool) No   Does patient have dental issues? No   Does patient have vision issues? No   Does your patient have an abnormal blood pressure today? No   Since previous encounter, have you referred patient for abnormal blood pressure that resulted in a new diagnosis or medication change? No   Does your patient have an abnormal blood glucose today? No   Since previous encounter, have you referred patient for abnormal blood glucose that resulted in a new diagnosis or medication change? No   Was there a life-saving intervention made? No      Client states that he wants CN to review pain management clinic paperwork. Client states that he's  been taking the prescribed suboxone. CN reminded client to only take as ordered. Client now states that he has not had a BM in 4 days. No other complaints.

## 2015-11-14 ENCOUNTER — Ambulatory Visit: Payer: Medicaid Other | Attending: Family Medicine | Admitting: Physical Therapy

## 2015-11-14 DIAGNOSIS — M6281 Muscle weakness (generalized): Secondary | ICD-10-CM | POA: Diagnosis present

## 2015-11-14 DIAGNOSIS — R293 Abnormal posture: Secondary | ICD-10-CM | POA: Diagnosis present

## 2015-11-14 DIAGNOSIS — M5441 Lumbago with sciatica, right side: Secondary | ICD-10-CM

## 2015-11-14 NOTE — Therapy (Addendum)
[place], [place], [address] Phone: [phone]   Fax:  [phone]  Physical Therapy Evaluation/One Time Evaluation  Patient Details  Name: David Wyatt MRN: [id number] Date of Birth: [birth year] Referring Provider: Arnoldo Morale MD  Encounter Date: 11/14/2015      PT End of Session - 11/14/15 0908    Visit Number 1   Number of Visits 1   Date for PT Re-Evaluation 11/14/15   Authorization Type Medicaid [place]/ Medicaid [place] Access   PT Start Time 0900   PT Stop Time 1000   PT Time Calculation (min) 60 min   Activity Tolerance Patient tolerated treatment well   Behavior During Therapy [facility] for tasks assessed/performed      Past Medical History:  Diagnosis Date  . Angioedema of lips 07/28/2012   left upper (07/29/2012)  . Arthritis    "hands" (07/28/2012)  . Chronic back pain   . Cocaine abuse   . Hypertension   . Neuropathy ([place])   . Type II diabetes mellitus ([place])     Past Surgical History:  Procedure Laterality Date  . HERNIA REPAIR Right 04/01/2012  . I&D EXTREMITY Left 12/13/2012   Procedure: IRRIGATION AND DEBRIDEMENT LEFT THUMB;  Surgeon: Tennis Must, MD;  Location: [place];  Service: Orthopedics;  Laterality: Left;  . INCISION AND DRAINAGE OF WOUND     boil on back/notes 07/14/2008 (07/29/2012)  . INGUINAL HERNIA REPAIR  04/01/2012   Procedure: HERNIA REPAIR INGUINAL ADULT;  Surgeon: Haywood Lasso, MD;  Location: [place];  Service: General;  Laterality: Right;    There were no vitals filed for this visit.       Subjective Assessment - 11/14/15 0902    Subjective I cant afford to pay for treatment, I don't have a job and I live in a shelter.  I get a sharp pain when I sit up straight.  I cant stay in any one position too long. I had a spinal fusion last year 07-15-15. I now have a pinched nerve in my back on my right   Pertinent History chronic Hepatitis, hernia repair about 6 months after  back surgery   Limitations Sitting;Standing;Walking;House hold activities   How long can you sit comfortably? 15 min   How long can you stand comfortably? 20 min   How long can you walk comfortably? 20 min   Diagnostic tests MRI, x ray   Currently in Pain? Yes   Pain Score 8    Pain Location Back   Pain Orientation Right   Pain Descriptors / Indicators Aching;Shooting   Pain Type Chronic pain   Pain Onset More than a month ago   Pain Frequency Intermittent   Aggravating Factors  bending over, sitting and standing too long   Pain Relieving Factors lying down, heat and ice            [facility] PT Assessment - 11/14/15 0910      Assessment   Medical Diagnosis low back pain   Referring Provider Arnoldo Morale MD   Onset Date/Surgical Date 12/14/14  spinal fusion L-4/5 07-15-15   Hand Dominance Right   Prior Therapy after spinal fusion     Precautions   Precautions Back  post fusion > year     Restrictions   Weight Bearing Restrictions No     Balance Screen   Has the patient fallen in the past 6 months No   Has the patient had a decrease  in activity level because of a fear of falling?  No   Is the patient reluctant to leave their home because of a fear of falling?  No     Prior Function   Level of Independence Independent   Vocation Unemployed     Cognition   Overall Cognitive Status Within Functional Limits for tasks assessed     Observation/Other Assessments   Observations lateral lean to the right in standing     Posture/Postural Control   Posture/Postural Control Postural limitations   Postural Limitations Rounded Shoulders;Forward head;Anterior pelvic tilt;Weight shift right   Posture Comments well healed scar from PLIF 07-15-14 L4/L5     AROM   Overall AROM  Deficits   Overall AROM Comments Pt is stiffness domininant   Lumbar Flexion 80  able to touch toes in standing   Lumbar Extension 15  mostly in upper lumbar and lower thoracic   Lumbar - Right Side Bend  25   pain in mulitiple pain sites. thoracic and lumbar,    Lumbar - Left Side Bend 16   Lumbar - Right Rotation 50%   Lumbar - Left Rotation 50%     Strength   Overall Strength Deficits   Overall Strength Comments abdominals 3/5, grossly 4/5   Right Hip Flexion 4/5   Right Hip Extension 4-/5   Right Hip ABduction 4-/5   Left Hip Flexion 4+/5   Left Hip Extension 4-/5   Left Hip ABduction 4/5   Right Knee Flexion 4/5   Right Knee Extension 4/5   Left Knee Flexion 4+/5   Left Knee Extension 4+/5     Flexibility   Hamstrings right 60, Left 65 , Hip flexors tight bil   R> L     Palpation   Spinal mobility hypomobile.  fusion L-4 L-5   Palpation comment tenderness over right iliac crest and gluteals.  Pt commented on several tender spots over thoracic and lumbar paraspinals and gluteas.                    [facility] Adult PT Treatment/Exercise - 11/14/15 0910      Self-Care   Self-Care ADL's;Lifting;Heat/Ice Application;Posture   ADL's handout given an explained   Lifting handout given and explained   Posture demo and handout given and explained   Heat/Ice Application Ice for pain reduction      Therapeutic Exercise  Education and instruction on self myofascial release using tennis ball  Information on [place] clinic and financial resources Bilateral knee to chest 5 x hold 10 sec Single knee to chest 5 x 10 sec hold Pelvic tilt 10 x with VC/TC Piriformis stretch in supine with ankle over knee and 30 sec hold x 2 right and left  Hamstring stretch with sheet 30 sec hold x 2 right and left VC/TC Child's pose forward and side to side  30 sec each x 2 each Hip flexor stretch 30 to 60 sec hold with hip flexor off side of bed and opposite knee bent to tolerance Home walking program handout Bridging 20 x in supiine            PT Education - 11/14/15 0952    Education provided Yes   Education Details POC, Explanation of findings, walking program, HEP, community  wellness and financial resources/ posture and body mechanics   Person(s) Educated Patient   Methods Explanation;Demonstration;Verbal cues;Handout;Tactile cues             PT Long Term Goals -  11/14/15 0909      PT LONG TERM GOAL #1   Title Pt will be given information regarding body mechanics, posture and proper lifting for protection of back.   Time 1   Period Days   Status Achieved     PT LONG TERM GOAL #2   Title Pt will be given initial walking program information and intial HEP for back pain and instructed in proper execution   Time 1   Period Days   Status Achieved     PT LONG TERM GOAL #3   Title Pt will be given information regarding financial assistance and pro bono clinic   Time 1   Period Days   Status Achieved               Plan - 11/14/15 [id number]    Clinical Impression Statement 55 yo male presents with low back pain mostly on right side with radiating pain into right buttock.  During evaluation pt reported several different palpable tender spots over bil thoracic and lumbar areas and gluteals.  right side more marked than Left.  Pt had spinal fusion on 07-15-14 L-4 L-5 at [facility] and reports a hernia repair 6 months afterwards. Pt presents wit stiffness dominant  muscle soreness and standing posture with weight shift to the right and trunk with  right lateral sway.  Pt has [employer] and only qualifies for one evaluation/treatment and he did not want to continue treatment as self pay.  Pt  presents with activity intolerance to walking and standing greater than 15 min and can sit for 15 minutes.  Pt now lives in a homeless shelter and is pursuing additional aid.  Pt given intial HEP , education on walking program, posture and body mechanics and was given a tennis ball for self myofascial release .  Will discharge due to finanacial reasons. Pt given handouts for all above, walking program, HEP, posture, body mechanics and lifting.   Pt given  note for [facility] to decrease amount of standing time for meals in order to use energy for exercise and walking.   Rehab Potential Fair   PT Frequency One time visit   PT Treatment/Interventions Patient/family education;Therapeutic exercise   PT Next Visit Plan DC One time evaluation   Consulted and Agree with Plan of Care Patient      Patient will benefit from skilled therapeutic intervention in order to improve the following deficits and impairments:  Pain, Improper body mechanics, Postural dysfunction, Hypomobility, Decreased range of motion, Decreased mobility  Visit Diagnosis: Right-sided low back pain with sciatica, sciatica laterality unspecified  Abnormal posture     Problem List Patient Active Problem List   Diagnosis Date Noted  . GERD (gastroesophageal reflux disease) 06/13/2015  . Controlled type 2 diabetes mellitus with complication, without long-term current use of insulin ([place]) 05/01/2015  . Diabetic neuropathy ([place]) 05/01/2015  . Essential hypertension, benign 11/01/2013  . Lumbar disc herniation 09/13/2013  . Unspecified constipation 09/13/2013  . Diabetes mellitus due to underlying condition without complications ([place]) [id number]  . Chronic back pain 09/13/2013  . Viral gastroenteritis 05/05/2013  . Hypoglycemia 05/05/2013  . Felon of finger of left hand with lymphangitis 12/13/2012  . Hyperglycemia 07/29/2012  . Angioedema of lips 07/29/2012  . Crack cocaine use 07/29/2012  . Arthritis 07/29/2012  . Weakness generalized 07/29/2012    Voncille Lo, PT 11/14/15 10:06 AM Phone: [phone] Fax: [facility]  [place], [place], [address] Phone: [phone]   Fax:  [phone]  Name: David Wyatt MRN: [id number] Date of Birth: 01/05/1961

## 2015-11-14 NOTE — Patient Instructions (Addendum)
Cryotherapy Cryotherapy means treatment with cold. Ice or gel packs can be used to reduce both pain and swelling. Ice is the most helpful within the first 24 to 48 hours after an injury or flare-up from overusing a muscle or joint. Sprains, strains, spasms, burning pain, shooting pain, and aches can all be eased with ice. Ice can also be used when recovering from surgery. Ice is effective, has very few side effects, and is safe for most people to use. PRECAUTIONS  Ice is not a safe treatment option for people with:  Raynaud phenomenon. This is a condition affecting small blood vessels in the extremities. Exposure to cold may cause your problems to return.  Cold hypersensitivity. There are many forms of cold hypersensitivity, including:  Cold urticaria. Red, itchy hives appear on the skin when the tissues begin to warm after being iced.  Cold erythema. This is a red, itchy rash caused by exposure to cold.  Cold hemoglobinuria. Red blood cells break down when the tissues begin to warm after being iced. The hemoglobin that carry oxygen are passed into the urine because they cannot combine with blood proteins fast enough.  Numbness or altered sensitivity in the area being iced. If you have any of the following conditions, do not use ice until you have discussed cryotherapy with your caregiver:  Heart conditions, such as arrhythmia, angina, or chronic heart disease.  High blood pressure.  Healing wounds or open skin in the area being iced.  Current infections.  Rheumatoid arthritis.  Poor circulation.  Diabetes. Ice slows the blood flow in the region it is applied. This is beneficial when trying to stop inflamed tissues from spreading irritating chemicals to surrounding tissues. However, if you expose your skin to cold temperatures for too long or without the proper protection, you can damage your skin or nerves. Watch for signs of skin damage due to cold. HOME CARE INSTRUCTIONS Follow  these tips to use ice and cold packs safely.  Place a dry or damp towel between the ice and skin. A damp towel will cool the skin more quickly, so you may need to shorten the time that the ice is used.  For a more rapid response, add gentle compression to the ice.  Ice for no more than 10 to 20 minutes at a time. The bonier the area you are icing, the less time it will take to get the benefits of ice.  Check your skin after 5 minutes to make sure there are no signs of a poor response to cold or skin damage.  Rest 20 minutes or more between uses.  Once your skin is numb, you can end your treatment. You can test numbness by very lightly touching your skin. The touch should be so light that you do not see the skin dimple from the pressure of your fingertip. When using ice, most people will feel these normal sensations in this order: cold, burning, aching, and numbness.  Do not use ice on someone who cannot communicate their responses to pain, such as small children or people with dementia. HOW TO MAKE AN ICE PACK Ice packs are the most common way to use ice therapy. Other methods include ice massage, ice baths, and cryosprays. Muscle creams that cause a cold, tingly feeling do not offer the same benefits that ice offers and should not be used as a substitute unless recommended by your caregiver. To make an ice pack, do one of the following:  Place crushed ice or a  bag of frozen vegetables in a sealable plastic bag. Squeeze out the excess air. Place this bag inside another plastic bag. Slide the bag into a pillowcase or place a damp towel between your skin and the bag.  Mix 3 parts water with 1 part rubbing alcohol. Freeze the mixture in a sealable plastic bag. When you remove the mixture from the freezer, it will be slushy. Squeeze out the excess air. Place this bag inside another plastic bag. Slide the bag into a pillowcase or place a damp towel between your skin and the bag. SEEK MEDICAL CARE  IF:  You develop white spots on your skin. This may give the skin a blotchy (mottled) appearance.  Your skin turns blue or pale.  Your skin becomes waxy or hard.  Your swelling gets worse. MAKE SURE YOU:   Understand these instructions.  Will watch your condition.  Will get help right away if you are not doing well or get worse. Document Released: 10/22/2010 Document Revised: 07/12/2013 Document Reviewed: 10/22/2010 [facility] Patient Information 2015 [place], [place]. This information is not intended to replace advice given to you by your health care provider. Make sure you discuss any questions you have with your health care provider.  If you get access to a freezer  Sleeping on Back  Place pillow under knees. A pillow with cervical support and a roll around waist are also helpful. Copyright  VHI. All rights reserved.  Sleeping on Side Place pillow between knees. Use cervical support under neck and a roll around waist as needed. Copyright  VHI. All rights reserved.   Sleeping on Stomach   If this is the only desirable sleeping position, place pillow under lower legs, and under stomach or chest as needed.  Posture - Sitting   Sit upright, head facing forward. Try using a roll to support lower back. Keep shoulders relaxed, and avoid rounded back. Keep hips level with knees. Avoid crossing legs for long periods. Stand to Sit / Sit to Stand   To sit: Bend knees to lower self onto front edge of chair, then scoot back on seat. To stand: Reverse sequence by placing one foot forward, and scoot to front of seat. Use rocking motion to stand up.   Work Height and Reach  Ideal work height is no more than 2 to 4 inches below elbow level when standing, and at elbow level when sitting. Reaching should be limited to arm's length, with elbows slightly bent.  Bending  Bend at hips and knees, not back. Keep feet shoulder-width apart.    Posture - Standing   Good posture is  important. Avoid slouching and forward head thrust. Maintain curve in low back and align ears over shoul- ders, hips over ankles.  Alternating Positions   Alternate tasks and change positions frequently to reduce fatigue and muscle tension. Take rest breaks. Computer Work   Position work to Programmer, multimedia. Use proper work and seat height. Keep shoulders back and down, wrists straight, and elbows at right angles. Use chair that provides full back support. Add footrest and lumbar roll as needed.  Getting Into / Out of Car  Lower self onto seat, scoot back, then bring in one leg at a time. Reverse sequence to get out.  Dressing  Lie on back to pull socks or slacks over feet, or sit and bend leg while keeping back straight.    Housework - Sink  Place one foot on ledge of cabinet under sink when standing at sink  for prolonged periods.   Pushing / Pulling  Pushing is preferable to pulling. Keep back in proper alignment, and use leg muscles to do the work.  Deep Squat   Squat and lift with both arms held against upper trunk. Tighten stomach muscles without holding breath. Use smooth movements to avoid jerking.  Avoid Twisting   Avoid twisting or bending back. Pivot around using foot movements, and bend at knees if needed when reaching for articles.  Carrying Luggage   Distribute weight evenly on both sides. Use a cart whenever possible. Do not twist trunk. Move body as a unit.   Lifting Principles .Maintain proper posture and head alignment. .Slide object as close as possible before lifting. .Move obstacles out of the way. .Test before lifting; ask for help if too heavy. .Tighten stomach muscles without holding breath. .Use smooth movements; do not jerk. .Use legs to do the work, and pivot with feet. .Distribute the work load symmetrically and close to the center of trunk. .Push instead of pull whenever possible.   Ask For Help   Ask for help and delegate to others  when possible. Coordinate your movements when lifting together, and maintain the low back curve.  Log Roll   Lying on back, bend left knee and place left arm across chest. Roll all in one movement to the right. Reverse to roll to the left. Always move as one unit. Housework - Sweeping  Use long-handled equipment to avoid stooping.   Housework - Wiping  Position yourself as close as possible to reach work surface. Avoid straining your back.  Laundry - Unloading Wash   To unload small items at bottom of washer, lift leg opposite to arm being used to reach.  [place] close to area to be raked. Use arm movements to do the work. Keep back straight and avoid twisting.     Cart  When reaching into cart with one arm, lift opposite leg to keep back straight.   Getting Into / Out of Bed  Lower self to lie down on one side by raising legs and lowering head at the same time. Use arms to assist moving without twisting. Bend both knees to roll onto back if desired. To sit up, start from lying on side, and use same move-ments in reverse. Housework - Vacuuming  Hold the vacuum with arm held at side. Step back and forth to move it, keeping head up. Avoid twisting.   Laundry - IT consultant so that bending and twisting can be avoided.   Laundry - Unloading Dryer  Squat down to reach into clothes dryer or use a reacher.  Gardening - Weeding / Probation officer or Kneel. Knee pads may be helpful.                    Double Knee to Chest (Flexion)   Gently pull both knees toward chest. Feel stretch in lower back or buttock area. Breathing deeply, Hold _10___ seconds. Repeat _5___ times. Do __2__ sessions per day.  Knee to Chest (Flexion)   Pull knee toward chest. Feel stretch in lower back or buttock area. Breathing deeply, Hold ___10_ seconds. Repeat with other knee. Repeat _5___ times. Do __1-2__ sessions per  day.  Isometric Abdominal   On your back bring belly button to spine and try to flatten you back, Do 10 x   2 x a day to relieve pain  Copyright  VHI. All rights  reserved.  Piriformis (Supine)   Cross legs, right on top. Gently pull other knee toward chest until stretch is felt in buttock/hip of top leg. Hold _30___ seconds. Repeat ___3_ times per set.  Do __2__ sessions per day.  http://orth.exer.us/676     Copyright  VHI. All rights reserved.    Hamstring Step 2   Left foot relaxed, knee straight, other leg bent, foot flat. Raise straight leg further upward to maximal range. Hold _30-60__ seconds. Relax leg completely down. Repeat _2-3__ times.  Copyright  VHI. All rights reserved.  Side Twist, Kneeling   Kneel, buttocks on heels. Fold upper body forward from hips. Then reach to each side as far as possible, keeping chest low toward floor. Hold each position _30__ seconds. Repeat __3_ times per session. Do __2_ sessions per day.  Copyright  VHI. All rights reserved.        Hip Flexor Stretch   Lying on back near edge of bed, bend one leg, foot flat. Hang other leg over edge, relaxed, thigh resting entirely on bed for  30 secs. Repeat ___3_ times. Do __3__ sessions per day. Advanced Exercise: Bend knee back keeping thigh in contact with bed.  Bridging    Slowly raise buttocks from floor, keeping stomach tight. Repeat _20___ times per set. Do ___1_ sets per session. Do __1-2__ sessions per day.  http://orth.exer.us/1096   Copyright  VHI. All rights reserved.    WALKING  Walking is a great form of exercise to increase your strength, endurance and overall fitness.  A walking program can help you start slowly and gradually build endurance as you go.  Everyone's ability is different, so each person's starting point will be different.  You do not have to follow them exactly.  The are just samples. You should simply find out what's right for you and stick to  that program.  Mr. Factor make sure you are moving your arms In the beginning, you'll start off walking 2-3 times a day for short distances.  As you get stronger, you'll be walking further at just 1-2 times per day.  A. You Can Walk For A Certain Length Of Time Each Day    Walk 5 minutes 3 times per day.  Increase 2 minutes every 2 days (3 times per day).  Work up to 25-30 minutes (1-2 times per day).   Example:   Day 1-2 5 minutes 3 times per day   Day 7-8 12 minutes 2-3 times per day   Day 13-14 25 minutes 1-2 times per day  B. You Can Walk For a Certain Distance Each Day     Distance can be substituted for time.    Example:   3 trips to mailbox (at road)   3 trips to corner of block   3 trips around the block   D. Walk __x__ Jog ____ Run ___  Please only do the exercises that your therapist has initialed and dated  Voncille Lo, PT 11/14/15 9:40 AM Phone: [phone] Fax: [phone]

## 2015-11-22 MED FILL — metFORMIN HCL 500 MG TABS: 500 | 30 days supply | Qty: 120 | Fill #0

## 2015-11-22 MED FILL — tiZANidine HCL 4 MG TABS: 4 | 30 days supply | Qty: 90 | Fill #0

## 2015-11-22 MED FILL — AMLODIPINE BESYLATE 2.5 MG: 2.5 | 30 days supply | Qty: 30 | Fill #1

## 2015-11-22 MED FILL — LANTUS SOLOSTAR 100 UNITS/M: 100 | 28 days supply | Qty: 6 | Fill #0

## 2015-11-22 MED FILL — GABAPENTIN 300 MG CAPSULE: 300 | 30 days supply | Qty: 90 | Fill #0

## 2015-12-05 ENCOUNTER — Encounter: Payer: Self-pay | Admitting: Pediatric Intensive Care

## 2015-12-05 DIAGNOSIS — Z136 Encounter for screening for cardiovascular disorders: Secondary | ICD-10-CM

## 2015-12-18 NOTE — Congregational Nurse Program (Signed)
Congregational Nurse Program Note  Date of Encounter: 12/05/2015  Past Medical History: Past Medical History:  Diagnosis Date  . Angioedema of lips 07/28/2012   left upper (07/29/2012)  . Arthritis    "hands" (07/28/2012)  . Chronic back pain   . Cocaine abuse   . Hypertension   . Neuropathy ([place])   . Type II diabetes mellitus ([place])     Encounter Details:     CNP Questionnaire - 12/05/15 1000      Patient Demographics   Is this a new or existing patient? Existing   Patient is considered a/an Not Applicable   Race African-American/Black     Patient Assistance   Location of Patient Assistance [facility]   Patient's financial/insurance status Medicaid   Uninsured Patient No   Patient referred to apply for the following financial assistance Not Applicable   Food insecurities addressed Not Applicable   Transportation assistance No   Assistance securing medications No   Educational health offerings Hypertension;Not Applicable;Navigating the healthcare system     Encounter Details   Primary purpose of visit Chronic Illness/Condition Visit;Navigating the Healthcare System   Was an Emergency Department visit averted? Not Applicable   Does patient have a medical provider? Yes   Patient referred to Not Applicable   Was a mental health screening completed? (GAINS tool) No   Does patient have dental issues? No   Does patient have vision issues? No   Does your patient have an abnormal blood pressure today? No   Since previous encounter, have you referred patient for abnormal blood pressure that resulted in a new diagnosis or medication change? No   Does your patient have an abnormal blood glucose today? No   Since previous encounter, have you referred patient for abnormal blood glucose that resulted in a new diagnosis or medication change? No   Was there a life-saving intervention made? No     Client in for BP check. State back pain is 4/10 in morning but increases to 8/10 by midday.  Taking amlodipine for hypertension. [place] will follow up with CN for BP checks as needed.

## 2016-01-03 ENCOUNTER — Ambulatory Visit: Payer: Medicaid Other | Attending: Family Medicine | Admitting: Family Medicine

## 2016-01-03 ENCOUNTER — Encounter: Payer: Self-pay | Admitting: Family Medicine

## 2016-01-03 VITALS — BP 133/89 | HR 80 | Temp 98.2°F | Ht 68.0 in | Wt 180.6 lb

## 2016-01-03 DIAGNOSIS — M5441 Lumbago with sciatica, right side: Secondary | ICD-10-CM | POA: Diagnosis not present

## 2016-01-03 DIAGNOSIS — M5442 Lumbago with sciatica, left side: Secondary | ICD-10-CM | POA: Diagnosis not present

## 2016-01-03 DIAGNOSIS — I10 Essential (primary) hypertension: Secondary | ICD-10-CM | POA: Diagnosis not present

## 2016-01-03 DIAGNOSIS — Z23 Encounter for immunization: Secondary | ICD-10-CM | POA: Diagnosis not present

## 2016-01-03 DIAGNOSIS — M5126 Other intervertebral disc displacement, lumbar region: Secondary | ICD-10-CM | POA: Insufficient documentation

## 2016-01-03 DIAGNOSIS — Z794 Long term (current) use of insulin: Secondary | ICD-10-CM | POA: Insufficient documentation

## 2016-01-03 DIAGNOSIS — M545 Low back pain: Secondary | ICD-10-CM | POA: Diagnosis present

## 2016-01-03 DIAGNOSIS — E1340 Other specified diabetes mellitus with diabetic neuropathy, unspecified: Secondary | ICD-10-CM | POA: Insufficient documentation

## 2016-01-03 DIAGNOSIS — G8929 Other chronic pain: Secondary | ICD-10-CM | POA: Insufficient documentation

## 2016-01-03 DIAGNOSIS — E1149 Type 2 diabetes mellitus with other diabetic neurological complication: Secondary | ICD-10-CM

## 2016-01-03 DIAGNOSIS — E118 Type 2 diabetes mellitus with unspecified complications: Secondary | ICD-10-CM | POA: Insufficient documentation

## 2016-01-03 LAB — POCT GLYCOSYLATED HEMOGLOBIN (HGB A1C): HEMOGLOBIN A1C: 6.8

## 2016-01-03 LAB — GLUCOSE, POCT (MANUAL RESULT ENTRY): POC GLUCOSE: 120 mg/dL — AB (ref 70–99)

## 2016-01-03 MED ORDER — TIZANIDINE HCL 4 MG PO TABS: 4.0000 mg | ORAL_TABLET | Freq: Three times a day (TID) | ORAL | 2 refills | Status: DC | PRN

## 2016-01-03 MED ORDER — AMLODIPINE BESYLATE 2.5 MG PO TABS: 2.5000 mg | ORAL_TABLET | Freq: Every day | ORAL | 2 refills | Status: DC

## 2016-01-03 MED ORDER — INSULIN GLARGINE 100 UNIT/ML SOLOSTAR PEN: 18.0000 [IU] | PEN_INJECTOR | Freq: Every day | SUBCUTANEOUS | 3 refills | Status: DC

## 2016-01-03 MED ORDER — GABAPENTIN 300 MG PO CAPS: 300.0000 mg | ORAL_CAPSULE | Freq: Three times a day (TID) | ORAL | 2 refills | Status: DC

## 2016-01-03 MED ORDER — METFORMIN HCL 500 MG PO TABS: 1000.0000 mg | ORAL_TABLET | Freq: Two times a day (BID) | ORAL | 3 refills | Status: DC

## 2016-01-03 MED ORDER — ATORVASTATIN CALCIUM 20 MG PO TABS: 20.0000 mg | ORAL_TABLET | Freq: Every day | ORAL | 3 refills | Status: DC

## 2016-01-03 MED ORDER — INSULIN ASPART 100 UNIT/ML FLEXPEN: 0.0000 [IU] | PEN_INJECTOR | Freq: Three times a day (TID) | SUBCUTANEOUS | 3 refills | Status: DC

## 2016-01-03 MED ORDER — NAPROXEN 500 MG PO TABS: 500.0000 mg | ORAL_TABLET | Freq: Two times a day (BID) | ORAL | 0 refills | Status: DC

## 2016-01-03 NOTE — Progress Notes (Signed)
Subjective:  Patient ID: David Wyatt, male    DOB: [birth year]  Age: 55 y.o. MRN: [id number]  CC: Diabetes and Back Pain (lower)   HPI David Wyatt is a 55 year old male with a history of type 2 diabetes mellitus (A1c 6.8), diabetic neuropathy, hypertension, chronic low back pain (status post previous back surgery) who comes into the clinic for follow-up visit.  He continues to complain of low back pain which radiates down his lower extremities and is relieved by bending forward. Currently seeing  [facility] and has been on Suboxone but then 2 some cocaine when he ran out of Suboxone and so has been referred for a reassessment.   He has been compliant with his Lantus and adheres to a diabetic diet. He does have numbness in his feet for which he takes Neurontin. He has not had an annual eye exam in a while.  Reflux symptoms are controlled and he denies abdominal pain, diarrhea, nausea or vomiting.    Past Medical History:  Diagnosis Date  . Angioedema of lips 07/28/2012   left upper (07/29/2012)  . Arthritis    "hands" (07/28/2012)  . Chronic back pain   . Cocaine abuse   . Hypertension   . Neuropathy ([place])   . Type II diabetes mellitus ([place])     Past Surgical History:  Procedure Laterality Date  . HERNIA REPAIR Right 04/01/2012  . I&D EXTREMITY Left 12/13/2012   Procedure: IRRIGATION AND DEBRIDEMENT LEFT THUMB;  Surgeon: Tennis Must, MD;  Location: [place] [facility];  Service: Orthopedics;  Laterality: Left;  . INCISION AND DRAINAGE OF WOUND     boil on back/notes 07/14/2008 (07/29/2012)  . INGUINAL HERNIA REPAIR  04/01/2012   Procedure: HERNIA REPAIR INGUINAL ADULT;  Surgeon: Haywood Lasso, MD;  Location: [facility];  Service: General;  Laterality: Right;    Allergies  Allergen Reactions  . Lisinopril Other (See Comments)    Lip Swelling     Outpatient Medications Prior to Visit  Medication Sig Dispense Refill  . amLODipine (NORVASC) 2.5 MG tablet Take 1 tablet (2.5 mg  total) by mouth daily. 30 tablet 2  . gabapentin (NEURONTIN) 300 MG capsule Take 1 capsule (300 mg total) by mouth 3 (three) times daily. 90 capsule 2  . insulin aspart (NOVOLOG FLEXPEN) 100 UNIT/ML FlexPen Inject 0-15 Units into the skin 3 (three) times daily with meals. According to sliding scale 15 mL 3  . Insulin Glargine (LANTUS SOLOSTAR) 100 UNIT/ML Solostar Pen Inject 18 Units into the skin daily at 10 pm. 5 pen 3  . metFORMIN (GLUCOPHAGE) 500 MG tablet Take 2 tablets (1,000 mg total) by mouth 2 (two) times daily with a meal. 120 tablet 3  . naproxen (NAPROSYN) 500 MG tablet Take 1 tablet (500 mg total) by mouth 2 (two) times daily. 30 tablet 0  . Buprenorphine HCl-Naloxone HCl 4-1 MG FILM Place under the tongue 2 (two) times daily.     . ondansetron (ZOFRAN ODT) 4 MG disintegrating tablet Take 1 tablet (4 mg total) by mouth every 8 (eight) hours as needed for nausea or vomiting. (Patient not taking: Reported on 01/03/2016) 10 tablet 0  . tiZANidine (ZANAFLEX) 4 MG tablet Take 1 tablet (4 mg total) by mouth every 8 (eight) hours as needed for muscle spasms. (Patient not taking: Reported on 01/03/2016) 90 tablet 2  . traMADol (ULTRAM) 50 MG tablet Take 1 tablet (50 mg total) by mouth every 8 (eight) hours as needed for moderate pain. (  Patient not taking: Reported on 01/03/2016) 60 tablet 1   No facility-administered medications prior to visit.     ROS Review of Systems  Constitutional: Negative for activity change and appetite change.  HENT: Negative for sinus pressure and sore throat.   Eyes: Negative for visual disturbance.  Respiratory: Negative for cough, chest tightness and shortness of breath.   Cardiovascular: Negative for chest pain and leg swelling.  Gastrointestinal: Negative for abdominal distention, abdominal pain, constipation and diarrhea.  Endocrine: Negative.   Genitourinary: Negative for dysuria.  Musculoskeletal: Positive for back pain. Negative for joint swelling and  myalgias.  Skin: Negative for rash.  Allergic/Immunologic: Negative.   Neurological: Positive for numbness. Negative for weakness and light-headedness.  Psychiatric/Behavioral: Negative for dysphoric mood and suicidal ideas.    Objective:  BP 133/89 (BP Location: Right Arm, Patient Position: Sitting, Cuff Size: Large)   Pulse 80   Temp 98.2 F (36.8 C) (Oral)   Ht 5\' 8"  (1.727 m)   Wt 180 lb 9.6 oz (81.9 kg)   SpO2 97%   BMI 27.46 kg/m   BP/Weight 01/03/2016 12/05/2015 [id number]  Systolic BP [id number]  Diastolic BP 89 70 91  Wt. (Lbs) 180.6 - -  BMI 27.46 - -      Physical Exam Constitutional: He is oriented to person, place, and time. He appears well-developed and well-nourished.  Cardiovascular: Normal rate, normal heart sounds and intact distal pulses.   No murmur heard. Pulmonary/Chest: Effort normal and breath sounds normal. He has no wheezes. He has no rales. He exhibits no tenderness.  Abdominal: Soft. Bowel sounds are normal. He exhibits no distension and no mass. There is no tenderness.  Musculoskeletal: Palpable bony protrusion on the left side of the pelvic bone which is tender Decreased range of motion. Vertical lumbar surgical scar He exhibits tenderness (on palpation of lumbar spine; positive straight leg raise on the right).  Neurological: He is alert and oriented to person, place, and time, ambulates bending over while holding a cane Psych: Normal  Lab Results  Component Value Date   HGBA1C 6.8 01/03/2016    CMP Latest Ref Rng & Units 11/08/2015 09/17/2015 07/25/2015  Glucose 65 - 99 mg/dL 113(H) 174(H) 249(H)  BUN 6 - 20 mg/dL 15 8 11   Creatinine 0.61 - 1.24 mg/dL 0.94 1.09 1.05  Sodium 135 - 145 mmol/L 139 139 137  Potassium 3.5 - 5.1 mmol/L 3.9 4.0 4.1  Chloride 101 - 111 mmol/L 104 107 107  CO2 22 - 32 mmol/L 28 24 21(L)  Calcium 8.9 - 10.3 mg/dL 9.3 8.9 9.1  Total Protein 6.5 - 8.1 g/dL 8.1 - -  Total Bilirubin 0.3 - 1.2 mg/dL 0.6 - -  Alkaline  Phos 38 - 126 U/L 95 - -  AST 15 - 41 U/L 15 - -  ALT 17 - 63 U/L 9(L) - -    Lipid Panel     Component Value Date/Time   CHOL 142 05/03/2015 0954   TRIG 160 (H) 05/03/2015 0954   HDL 53 05/03/2015 0954   CHOLHDL 2.7 05/03/2015 0954   VLDL 32 (H) 05/03/2015 0954   LDLCALC 57 05/03/2015 0954    Assessment & Plan:   1. Controlled type 2 diabetes mellitus with complication, with long-term current use of insulin (HCC) Controlled with A1c of 6.8 Continue diabetic diet - Glucose (CBG) - HgB A1c - insulin aspart (NOVOLOG FLEXPEN) 100 UNIT/ML FlexPen; Inject 0-15 Units into the skin 3 (three) times daily with  meals. According to sliding scale  Dispense: 15 mL; Refill: 3 - Insulin Glargine (LANTUS SOLOSTAR) 100 UNIT/ML Solostar Pen; Inject 18 Units into the skin daily at 10 pm.  Dispense: 5 pen; Refill: 3 - metFORMIN (GLUCOPHAGE) 500 MG tablet; Take 2 tablets (1,000 mg total) by mouth 2 (two) times daily with a meal.  Dispense: 120 tablet; Refill: 3 - Ambulatory referral to Ophthalmology  2. Essential hypertension, benign Controlled - amLODipine (NORVASC) 2.5 MG tablet; Take 1 tablet (2.5 mg total) by mouth daily.  Dispense: 30 tablet; Refill: 2  3. Chronic bilateral low back pain with bilateral sciatica Uncontrolled Advised to resume tizanidine and naproxen - gabapentin (NEURONTIN) 300 MG capsule; Take 1 capsule (300 mg total) by mouth 3 (three) times daily.  Dispense: 90 capsule; Refill: 2 - tiZANidine (ZANAFLEX) 4 MG tablet; Take 1 tablet (4 mg total) by mouth every 8 (eight) hours as needed for muscle spasms.  Dispense: 90 tablet; Refill: 2  4. Lumbar disc herniation Currently under the care of [facility] and will need to complete an assessment prior to seeing them again due to abnormal drug screen  5. Other diabetic neurological complication associated with type 2 diabetes mellitus ([place]) Continue gabapentin   Meds ordered this encounter  Medications  . amLODipine  (NORVASC) 2.5 MG tablet    Sig: Take 1 tablet (2.5 mg total) by mouth daily.    Dispense:  30 tablet    Refill:  2  . gabapentin (NEURONTIN) 300 MG capsule    Sig: Take 1 capsule (300 mg total) by mouth 3 (three) times daily.    Dispense:  90 capsule    Refill:  2  . insulin aspart (NOVOLOG FLEXPEN) 100 UNIT/ML FlexPen    Sig: Inject 0-15 Units into the skin 3 (three) times daily with meals. According to sliding scale    Dispense:  15 mL    Refill:  3  . Insulin Glargine (LANTUS SOLOSTAR) 100 UNIT/ML Solostar Pen    Sig: Inject 18 Units into the skin daily at 10 pm.    Dispense:  5 pen    Refill:  3  . metFORMIN (GLUCOPHAGE) 500 MG tablet    Sig: Take 2 tablets (1,000 mg total) by mouth 2 (two) times daily with a meal.    Dispense:  120 tablet    Refill:  3  . naproxen (NAPROSYN) 500 MG tablet    Sig: Take 1 tablet (500 mg total) by mouth 2 (two) times daily.    Dispense:  60 tablet    Refill:  0  . tiZANidine (ZANAFLEX) 4 MG tablet    Sig: Take 1 tablet (4 mg total) by mouth every 8 (eight) hours as needed for muscle spasms.    Dispense:  90 tablet    Refill:  2  . atorvastatin (LIPITOR) 20 MG tablet    Sig: Take 1 tablet (20 mg total) by mouth daily.    Dispense:  30 tablet    Refill:  3    Follow-up: Return in about 3 months (around 04/04/2016) for follow up on diabetes mellitus.   Arnoldo Morale MD

## 2016-01-03 NOTE — Progress Notes (Signed)
Cocaine in urine found by pain management- he has to go back for an assessment

## 2016-01-18 MED FILL — NOVOLOG FLEXPEN SYRINGE: 100 | 30 days supply | Qty: 15 | Fill #0

## 2016-01-18 MED FILL — AMLODIPINE BESYLATE 2.5 MG: 2.5 | 30 days supply | Qty: 30 | Fill #0

## 2016-01-18 MED FILL — tiZANidine HCL 4 MG TABS: 4 | 30 days supply | Qty: 90 | Fill #0

## 2016-01-18 MED FILL — LANTUS SOLOSTAR 100 UNITS/M: 100 | 30 days supply | Qty: 6 | Fill #0

## 2016-01-18 MED FILL — ATORVASTATIN 20 MG TABLET: 20 | 30 days supply | Qty: 30 | Fill #0

## 2016-01-18 MED FILL — NAPROXEN 500 MG TABLET: 500 | 30 days supply | Qty: 60 | Fill #0

## 2016-01-18 MED FILL — GABAPENTIN 300 MG CAPSULE: 300 | 30 days supply | Qty: 90 | Fill #0

## 2016-01-18 MED FILL — metFORMIN HCL 500 MG TABS: 500 | 30 days supply | Qty: 120 | Fill #0

## 2016-02-21 ENCOUNTER — Telehealth: Payer: Self-pay | Admitting: Family Medicine

## 2016-02-21 NOTE — Telephone Encounter (Signed)
Pt. Called requesting an appt. With PCP. Pt. Was told he had to call 12/18 to schedule appt. He requested to speak  With his nurse b/c the pain clinic prescribed him suboxone. Pt. States does not know why he was prescribed that medication.  He states it was giving to him for his pain. He stopped taking the medication b/c it makes his sick. Please f/u with pt.

## 2016-02-22 NOTE — Telephone Encounter (Signed)
Writer called patient back and LVM to encourage him to call the [facility] which is where he was prescribed the medication he is having problems with.  Patient is to call on the 18th to schedule a regular F/U with Dr. Jarold Song.

## 2016-02-23 ENCOUNTER — Telehealth: Payer: Self-pay

## 2016-02-23 NOTE — Telephone Encounter (Signed)
Patient called stating he does not like the suboxone that the pain clinic has prescribed for his back pain and he would like another referral to a different pain clinic.  Writer encouraged patient to go back to the clinic and discuss with his physician.

## 2016-03-11 ENCOUNTER — Encounter (HOSPITAL_COMMUNITY): Payer: Self-pay | Admitting: Emergency Medicine

## 2016-03-11 ENCOUNTER — Emergency Department (HOSPITAL_COMMUNITY)
Admission: EM | Admit: 2016-03-11 | Discharge: 2016-03-11 | Disposition: A | Discharge status: Home / Self Care | Payer: Medicaid Other | Attending: Emergency Medicine | Admitting: Emergency Medicine

## 2016-03-11 DIAGNOSIS — Z5321 Procedure and treatment not carried out due to patient leaving prior to being seen by health care provider: Secondary | ICD-10-CM | POA: Insufficient documentation

## 2016-03-11 DIAGNOSIS — R109 Unspecified abdominal pain: Secondary | ICD-10-CM | POA: Diagnosis not present

## 2016-03-11 DIAGNOSIS — R1110 Vomiting, unspecified: Secondary | ICD-10-CM | POA: Diagnosis present

## 2016-03-11 DIAGNOSIS — R197 Diarrhea, unspecified: Secondary | ICD-10-CM | POA: Insufficient documentation

## 2016-03-11 LAB — CBC
HEMATOCRIT: 53.3 % — AB (ref 39.0–52.0)
Hemoglobin: 17.7 g/dL — ABNORMAL HIGH (ref 13.0–17.0)
MCH: 30.8 pg (ref 26.0–34.0)
MCHC: 33.2 g/dL (ref 30.0–36.0)
MCV: 92.9 fL (ref 78.0–100.0)
Platelets: 413 10*3/uL — ABNORMAL HIGH (ref 150–400)
RBC: 5.74 MIL/uL (ref 4.22–5.81)
RDW: 13.5 % (ref 11.5–15.5)
WBC: 7.2 10*3/uL (ref 4.0–10.5)

## 2016-03-11 LAB — COMPREHENSIVE METABOLIC PANEL
ALBUMIN: 4.3 g/dL (ref 3.5–5.0)
ALK PHOS: 100 U/L (ref 38–126)
ALT: 17 U/L (ref 17–63)
AST: 28 U/L (ref 15–41)
Anion gap: 7 (ref 5–15)
BILIRUBIN TOTAL: 0.8 mg/dL (ref 0.3–1.2)
BUN: 16 mg/dL (ref 6–20)
CO2: 28 mmol/L (ref 22–32)
CREATININE: 1.24 mg/dL (ref 0.61–1.24)
Calcium: 9.5 mg/dL (ref 8.9–10.3)
Chloride: 109 mmol/L (ref 101–111)
GFR calc Af Amer: >60 mL/min (ref >60)
GFR calc non Af Amer: >60 mL/min (ref >60)
GLUCOSE: 90 mg/dL (ref 65–99)
POTASSIUM: 3.9 mmol/L (ref 3.5–5.1)
Sodium: 144 mmol/L (ref 135–145)
TOTAL PROTEIN: 7.9 g/dL (ref 6.5–8.1)

## 2016-03-11 LAB — LIPASE, BLOOD: Lipase: 22 U/L (ref 11–51)

## 2016-03-11 NOTE — ED Notes (Addendum)
Patient stating wanting to leave, requesting to have something to eat. Patient refused to give urine sample at this time. This Probation officer explained that we usually refrain from giving patinetns things to eat and drink to prevent further nausea or vomiting. This Probation officer offered to get a nurse to give zofran to patient, they refused. Patient wants to leave. RN Sara Chu made aware so the patient could sign out AMA

## 2016-03-11 NOTE — ED Triage Notes (Signed)
Per EMS, patient is complaining of bilateral lower abdominal, n /v x3 /d x1 for 1 hour. Patient ate [employer] 1 hour and symptoms shortly followed.

## 2016-03-11 NOTE — ED Notes (Signed)
Patient left facility without being seen by a provider. Patient states he "cannot wait to be seen." Patient is conscious, alert, oriented, ambulatory, breathing normally, and in no acute distress.

## 2016-05-06 ENCOUNTER — Ambulatory Visit: Payer: Medicaid Other | Admitting: Family Medicine

## 2016-05-16 ENCOUNTER — Encounter: Payer: Self-pay | Admitting: Family Medicine

## 2016-05-16 ENCOUNTER — Ambulatory Visit: Payer: Medicaid Other | Attending: Family Medicine | Admitting: Family Medicine

## 2016-05-16 VITALS — BP 145/97 | HR 73 | Temp 97.8°F | Ht 68.0 in | Wt 167.4 lb

## 2016-05-16 DIAGNOSIS — G8929 Other chronic pain: Secondary | ICD-10-CM | POA: Diagnosis not present

## 2016-05-16 DIAGNOSIS — E118 Type 2 diabetes mellitus with unspecified complications: Secondary | ICD-10-CM | POA: Insufficient documentation

## 2016-05-16 DIAGNOSIS — I10 Essential (primary) hypertension: Secondary | ICD-10-CM | POA: Diagnosis not present

## 2016-05-16 DIAGNOSIS — Z794 Long term (current) use of insulin: Secondary | ICD-10-CM | POA: Diagnosis not present

## 2016-05-16 DIAGNOSIS — M5442 Lumbago with sciatica, left side: Secondary | ICD-10-CM | POA: Insufficient documentation

## 2016-05-16 DIAGNOSIS — M5441 Lumbago with sciatica, right side: Secondary | ICD-10-CM | POA: Insufficient documentation

## 2016-05-16 LAB — GLUCOSE, POCT (MANUAL RESULT ENTRY): POC Glucose: 195 mg/dl — AB (ref 70–99)

## 2016-05-16 LAB — POCT GLYCOSYLATED HEMOGLOBIN (HGB A1C): Hemoglobin A1C: 7.1

## 2016-05-16 MED ORDER — INSULIN GLARGINE 100 UNIT/ML SOLOSTAR PEN: 18.0000 [IU] | PEN_INJECTOR | Freq: Every day | SUBCUTANEOUS | 3 refills | Status: DC

## 2016-05-16 MED ORDER — ATORVASTATIN CALCIUM 20 MG PO TABS: 20.0000 mg | ORAL_TABLET | Freq: Every day | ORAL | 3 refills | Status: DC

## 2016-05-16 MED ORDER — NAPROXEN 500 MG PO TABS: 500.0000 mg | ORAL_TABLET | Freq: Two times a day (BID) | ORAL | 3 refills | Status: DC

## 2016-05-16 MED ORDER — TIZANIDINE HCL 4 MG PO TABS: 4.0000 mg | ORAL_TABLET | Freq: Three times a day (TID) | ORAL | 3 refills | Status: DC | PRN

## 2016-05-16 MED ORDER — INSULIN ASPART 100 UNIT/ML FLEXPEN: 0.0000 [IU] | PEN_INJECTOR | Freq: Three times a day (TID) | SUBCUTANEOUS | 3 refills | Status: DC

## 2016-05-16 MED ORDER — AMLODIPINE BESYLATE 2.5 MG PO TABS: 2.5000 mg | ORAL_TABLET | Freq: Every day | ORAL | 3 refills | Status: DC

## 2016-05-16 MED ORDER — GABAPENTIN 300 MG PO CAPS: 300.0000 mg | ORAL_CAPSULE | Freq: Three times a day (TID) | ORAL | 3 refills | Status: DC

## 2016-05-16 MED ORDER — METFORMIN HCL 500 MG PO TABS: 1000.0000 mg | ORAL_TABLET | Freq: Two times a day (BID) | ORAL | 3 refills | Status: DC

## 2016-05-16 NOTE — Progress Notes (Signed)
Subjective:  Patient ID: David Wyatt, male    DOB: July 07, 1960  Age: 56 y.o. MRN: [id number]  CC: Hypertension; Diabetes; and Back Pain (lower)   HPI [name] is a 56 year old male with a history of type 2 diabetes mellitus (A1c 6.8), diabetic neuropathy, hypertension, chronic low back pain (status post previous back surgery) who comes into the clinic for follow-up visit.  He continues to complain of low back pain which radiates down his lower extremities and is relieved by bending forward. No longer seeing  [facility] pain management As he was unable to tolerate Suboxone due to jittery feelings while taking it. He remains on a muscle relaxant, naproxen and gabapentin for his back symptoms.  Denies loss of sphincteric function or recent falls  He has been compliant with his Lantus and adheres to a diabetic diet. He does have numbness in his feet for which he takes Neurontin. Denies visual symptoms or hypoglycemia. He has not had an annual eye exam in a while.  Reflux symptoms are controlled and he denies abdominal pain, diarrhea, nausea or vomiting.  Past Medical History:  Diagnosis Date  . Angioedema of lips 07/28/2012   left upper (07/29/2012)  . Arthritis    "hands" (07/28/2012)  . Chronic back pain   . Cocaine abuse   . Hypertension   . Neuropathy ([place])   . Type II diabetes mellitus ([place])     Past Surgical History:  Procedure Laterality Date  . HERNIA REPAIR Right 04/01/2012  . I&D EXTREMITY Left 12/13/2012   Procedure: IRRIGATION AND DEBRIDEMENT LEFT THUMB;  Surgeon: Tennis Must, MD;  Location: [facility];  Service: Orthopedics;  Laterality: Left;  . INCISION AND DRAINAGE OF WOUND     boil on back/notes 07/14/2008 (07/29/2012)  . INGUINAL HERNIA REPAIR  04/01/2012   Procedure: HERNIA REPAIR INGUINAL ADULT;  Surgeon: Haywood Lasso, MD;  Location: [facility];  Service: General;  Laterality: Right;    Allergies  Allergen Reactions  . Lisinopril Other (See Comments)    Lip Swelling        Outpatient Medications Prior to Visit  Medication Sig Dispense Refill  . gabapentin (NEURONTIN) 300 MG capsule Take 1 capsule (300 mg total) by mouth 3 (three) times daily. 90 capsule 2  . insulin aspart (NOVOLOG FLEXPEN) 100 UNIT/ML FlexPen Inject 0-15 Units into the skin 3 (three) times daily with meals. According to sliding scale 15 mL 3  . Insulin Glargine (LANTUS SOLOSTAR) 100 UNIT/ML Solostar Pen Inject 18 Units into the skin daily at 10 pm. 5 pen 3  . metFORMIN (GLUCOPHAGE) 500 MG tablet Take 2 tablets (1,000 mg total) by mouth 2 (two) times daily with a meal. 120 tablet 3  . naproxen (NAPROSYN) 500 MG tablet Take 1 tablet (500 mg total) by mouth 2 (two) times daily. 60 tablet 0  . Buprenorphine HCl-Naloxone HCl 4-1 MG FILM Place under the tongue 2 (two) times daily.     Marland Kitchen amLODipine (NORVASC) 2.5 MG tablet Take 1 tablet (2.5 mg total) by mouth daily. (Patient not taking: Reported on 05/16/2016) 30 tablet 2  . atorvastatin (LIPITOR) 20 MG tablet Take 1 tablet (20 mg total) by mouth daily. (Patient not taking: Reported on 05/16/2016) 30 tablet 3  . tiZANidine (ZANAFLEX) 4 MG tablet Take 1 tablet (4 mg total) by mouth every 8 (eight) hours as needed for muscle spasms. (Patient not taking: Reported on 05/16/2016) 90 tablet 2   No facility-administered medications prior to visit.  ROS Review of Systems Constitutional: Negative for activity change and appetite change.  HENT: Negative for sinus pressure and sore throat.   Eyes: Negative for visual disturbance.  Respiratory: Negative for cough, chest tightness and shortness of breath.   Cardiovascular: Negative for chest pain and leg swelling.  Gastrointestinal: Negative for abdominal distention, abdominal pain, constipation and diarrhea.  Endocrine: Negative.   Genitourinary: Negative for dysuria.  Musculoskeletal: Positive for back pain. Negative for joint swelling and myalgias.  Skin: Negative for rash.  Allergic/Immunologic:  Negative.   Neurological: Positive for numbness. Negative for weakness and light-headedness  Objective:  BP (!) 145/97 (BP Location: Right Arm, Patient Position: Sitting, Cuff Size: Large)   Pulse 73   Temp 97.8 F (36.6 C) (Oral)   Ht 5\' 8"  (1.727 m)   Wt 167 lb 6.4 oz (75.9 kg)   SpO2 97%   BMI 25.45 kg/m   BP/Weight 05/16/2016 03/11/2016 47/11/6281  Systolic BP 662 947 654  Diastolic BP 97 90 89  Wt. (Lbs) 167.4 165 180.6  BMI 25.45 25.09 27.46      Physical Exam Constitutional: He is oriented to person, place, and time. He appears well-developed and well-nourished.  Cardiovascular: Normal rate, normal heart sounds and intact distal pulses.   No murmur heard. Pulmonary/Chest: Effort normal and breath sounds normal. He has no wheezes. He has no rales. He exhibits no tenderness.  Abdominal: Soft. Bowel sounds are normal. He exhibits no distension and no mass. There is no tenderness.  Musculoskeletal: Palpable bony protrusion on the left side of the pelvic bone which is tender Decreased range of motion. Vertical lumbar surgical scar He exhibits tenderness (on palpation of lumbar spine; positive straight leg raise on the right).  Neurological: He is alert and oriented to person, place, and time, ambulates bending over while holding a cane Psych: Normal  Lab Results  Component Value Date   HGBA1C 7.1 05/16/2016    CMP Latest Ref Rng & Units 03/11/2016 11/08/2015 09/17/2015  Glucose 65 - 99 mg/dL 90 113(H) 174(H)  BUN 6 - 20 mg/dL 16 15 8   Creatinine 0.61 - 1.24 mg/dL 1.24 0.94 1.09  Sodium 135 - 145 mmol/L 144 139 139  Potassium 3.5 - 5.1 mmol/L 3.9 3.9 4.0  Chloride 101 - 111 mmol/L 109 104 107  CO2 22 - 32 mmol/L 28 28 24   Calcium 8.9 - 10.3 mg/dL 9.5 9.3 8.9  Total Protein 6.5 - 8.1 g/dL 7.9 8.1 -  Total Bilirubin 0.3 - 1.2 mg/dL 0.8 0.6 -  Alkaline Phos 38 - 126 U/L 100 95 -  AST 15 - 41 U/L 28 15 -  ALT 17 - 63 U/L 17 9(L) -     Assessment & Plan:   1. Controlled  type 2 diabetes mellitus with complication, without long-term current use of insulin (HCC) Controlled with A1c of 7.1 Continue medications Advised to schedule an eye exam - Glucose (CBG) - HgB A1c - Lipid Panel w/reflex Direct LDL; Future - Microalbumin / creatinine urine ratio; Future - COMPLETE METABOLIC PANEL WITH GFR; Future  2. Essential hypertension, benign Slightly elevated due to running out of her antihypertensives which I have refilled Low-sodium diet - amLODipine (NORVASC) 2.5 MG tablet; Take 1 tablet (2.5 mg total) by mouth daily.  Dispense: 30 tablet; Refill: 3  3. Controlled type 2 diabetes mellitus with complication, with long-term current use of insulin (HCC) - insulin aspart (NOVOLOG FLEXPEN) 100 UNIT/ML FlexPen; Inject 0-15 Units into the skin 3 (three) times  daily with meals. According to sliding scale  Dispense: 15 mL; Refill: 3 - Insulin Glargine (LANTUS SOLOSTAR) 100 UNIT/ML Solostar Pen; Inject 18 Units into the skin daily at 10 pm.  Dispense: 5 pen; Refill: 3 - metFORMIN (GLUCOPHAGE) 500 MG tablet; Take 2 tablets (1,000 mg total) by mouth 2 (two) times daily with a meal.  Dispense: 120 tablet; Refill: 3  4. Chronic bilateral low back pain with bilateral sciatica Continue NSAIDs, muscle relaxants and gabapentin. - gabapentin (NEURONTIN) 300 MG capsule; Take 1 capsule (300 mg total) by mouth 3 (three) times daily.  Dispense: 90 capsule; Refill: 3 - tiZANidine (ZANAFLEX) 4 MG tablet; Take 1 tablet (4 mg total) by mouth every 8 (eight) hours as needed for muscle spasms.  Dispense: 90 tablet; Refill: 3   Meds ordered this encounter  Medications  . atorvastatin (LIPITOR) 20 MG tablet    Sig: Take 1 tablet (20 mg total) by mouth daily.    Dispense:  30 tablet    Refill:  3  . naproxen (NAPROSYN) 500 MG tablet    Sig: Take 1 tablet (500 mg total) by mouth 2 (two) times daily.    Dispense:  60 tablet    Refill:  3  . amLODipine (NORVASC) 2.5 MG tablet    Sig: Take  1 tablet (2.5 mg total) by mouth daily.    Dispense:  30 tablet    Refill:  3  . insulin aspart (NOVOLOG FLEXPEN) 100 UNIT/ML FlexPen    Sig: Inject 0-15 Units into the skin 3 (three) times daily with meals. According to sliding scale    Dispense:  15 mL    Refill:  3  . Insulin Glargine (LANTUS SOLOSTAR) 100 UNIT/ML Solostar Pen    Sig: Inject 18 Units into the skin daily at 10 pm.    Dispense:  5 pen    Refill:  3  . metFORMIN (GLUCOPHAGE) 500 MG tablet    Sig: Take 2 tablets (1,000 mg total) by mouth 2 (two) times daily with a meal.    Dispense:  120 tablet    Refill:  3  . gabapentin (NEURONTIN) 300 MG capsule    Sig: Take 1 capsule (300 mg total) by mouth 3 (three) times daily.    Dispense:  90 capsule    Refill:  3  . tiZANidine (ZANAFLEX) 4 MG tablet    Sig: Take 1 tablet (4 mg total) by mouth every 8 (eight) hours as needed for muscle spasms.    Dispense:  90 tablet    Refill:  3    Follow-up: Return in about 3 months (around 08/16/2016) for Follow-up on diabetes mellitus.   Arnoldo Morale MD

## 2016-05-16 NOTE — Patient Instructions (Signed)
Back Pain, Adult Back pain is very common in adults.The cause of back pain is rarely dangerous and the pain often gets better over time.The cause of your back pain may not be known. Some common causes of back pain include:  Strain of the muscles or ligaments supporting the spine.  Wear and tear (degeneration) of the spinal disks.  Arthritis.  Direct injury to the back. For many people, back pain may return. Since back pain is rarely dangerous, most people can learn to manage this condition on their own. Follow these instructions at home: Watch your back pain for any changes. The following actions may help to lessen any discomfort you are feeling:  Remain active. It is stressful on your back to sit or stand in one place for long periods of time. Do not sit, drive, or stand in one place for more than 30 minutes at a time. Take short walks on even surfaces as soon as you are able.Try to increase the length of time you walk each day.  Exercise regularly as directed by your health care provider. Exercise helps your back heal faster. It also helps avoid future injury by keeping your muscles strong and flexible.  Do not stay in bed.Resting more than 1-2 days can delay your recovery.  Pay attention to your body when you bend and lift. The most comfortable positions are those that put less stress on your recovering back. Always use proper lifting techniques, including:  Bending your knees.  Keeping the load close to your body.  Avoiding twisting.  Find a comfortable position to sleep. Use a firm mattress and lie on your side with your knees slightly bent. If you lie on your back, put a pillow under your knees.  Avoid feeling anxious or stressed.Stress increases muscle tension and can worsen back pain.It is important to recognize when you are anxious or stressed and learn ways to manage it, such as with exercise.  Take medicines only as directed by your health care provider.  Over-the-counter medicines to reduce pain and inflammation are often the most helpful.Your health care provider may prescribe muscle relaxant drugs.These medicines help dull your pain so you can more quickly return to your normal activities and healthy exercise.  Apply ice to the injured area:  Put ice in a plastic bag.  Place a towel between your skin and the bag.  Leave the ice on for 20 minutes, 2-3 times a day for the first 2-3 days. After that, ice and heat may be alternated to reduce pain and spasms.  Maintain a healthy weight. Excess weight puts extra stress on your back and makes it difficult to maintain good posture. Contact a health care provider if:  You have pain that is not relieved with rest or medicine.  You have increasing pain going down into the legs or buttocks.  You have pain that does not improve in one week.  You have night pain.  You lose weight.  You have a fever or chills. Get help right away if:  You develop new bowel or bladder control problems.  You have unusual weakness or numbness in your arms or legs.  You develop nausea or vomiting.  You develop abdominal pain.  You feel faint. This information is not intended to replace advice given to you by your health care provider. Make sure you discuss any questions you have with your health care provider. Document Released: 02/25/2005 Document Revised: 07/06/2015 Document Reviewed: 06/29/2013 Elsevier Interactive Patient Education  2017 [employer]  

## 2016-05-16 NOTE — Progress Notes (Signed)
Patient hasn't taken BP meds in 2 days

## 2016-05-17 ENCOUNTER — Other Ambulatory Visit: Payer: Medicaid Other

## 2016-05-22 ENCOUNTER — Ambulatory Visit: Payer: Medicaid Other | Attending: Family Medicine

## 2016-05-22 DIAGNOSIS — E118 Type 2 diabetes mellitus with unspecified complications: Secondary | ICD-10-CM | POA: Insufficient documentation

## 2016-05-22 LAB — COMPLETE METABOLIC PANEL WITH GFR
ALBUMIN: 3.8 g/dL (ref 3.6–5.1)
ALK PHOS: 110 U/L (ref 40–115)
ALT: 8 U/L — AB (ref 9–46)
AST: 12 U/L (ref 10–35)
BUN: 16 mg/dL (ref 7–25)
CO2: 25 mmol/L (ref 20–31)
Calcium: 8.8 mg/dL (ref 8.6–10.3)
Chloride: 106 mmol/L (ref 98–110)
Creat: 1.15 mg/dL (ref 0.70–1.33)
GFR, Est African American: 82 mL/min (ref >=60)
GFR, Est Non African American: 71 mL/min (ref >=60)
GLUCOSE: 96 mg/dL (ref 65–99)
Potassium: 4.2 mmol/L (ref 3.5–5.3)
Sodium: 141 mmol/L (ref 135–146)
Total Bilirubin: 0.3 mg/dL (ref 0.2–1.2)
Total Protein: 6.9 g/dL (ref 6.1–8.1)

## 2016-05-22 LAB — LIPID PANEL W/REFLEX DIRECT LDL
CHOLESTEROL: 211 mg/dL — AB (ref <200)
HDL: 59 mg/dL (ref >40)
LDL-CHOLESTEROL: 128 mg/dL — AB
Non-HDL Cholesterol (Calc): 152 mg/dL — ABNORMAL HIGH (ref <130)
TRIGLYCERIDES: 128 mg/dL (ref <150)
Total CHOL/HDL Ratio: 3.6 Ratio (ref <5.0)

## 2016-05-22 NOTE — Progress Notes (Signed)
Patient here for lab visit only 

## 2016-05-23 ENCOUNTER — Other Ambulatory Visit: Payer: Self-pay | Admitting: Family Medicine

## 2016-05-23 LAB — MICROALBUMIN / CREATININE URINE RATIO
Creatinine, Urine: 300 mg/dL (ref 20–370)
Microalb Creat Ratio: 5 mcg/mg creat (ref <30)
Microalb, Ur: 1.4 mg/dL

## 2016-05-23 MED ORDER — ATORVASTATIN CALCIUM 40 MG PO TABS: 40.0000 mg | ORAL_TABLET | Freq: Every day | ORAL | 3 refills | Status: DC

## 2016-06-11 ENCOUNTER — Telehealth: Payer: Self-pay

## 2016-06-11 NOTE — Telephone Encounter (Signed)
Writer called patient to discuss labs.  Patient stated understanding that his cholesterol meds have been doubled.  Patient admits that he has not taken the atorvastatin "in awhile", patient will pick up new dose of medication today.

## 2016-06-11 NOTE — Telephone Encounter (Signed)
-----   Message from Arnoldo Morale, MD sent at 05/24/2016  4:54 PM EDT ----- Please inform the patient that labs are normal. Thank you.

## 2016-06-16 ENCOUNTER — Encounter (HOSPITAL_COMMUNITY): Payer: Self-pay | Admitting: Emergency Medicine

## 2016-06-16 ENCOUNTER — Emergency Department (HOSPITAL_COMMUNITY): Payer: Medicaid Other

## 2016-06-16 ENCOUNTER — Emergency Department (HOSPITAL_COMMUNITY)
Admission: EM | Admit: 2016-06-16 | Discharge: 2016-06-16 | Disposition: A | Discharge status: Home / Self Care | Payer: Medicaid Other | Attending: Emergency Medicine | Admitting: Emergency Medicine

## 2016-06-16 DIAGNOSIS — Z79899 Other long term (current) drug therapy: Secondary | ICD-10-CM | POA: Insufficient documentation

## 2016-06-16 DIAGNOSIS — F17210 Nicotine dependence, cigarettes, uncomplicated: Secondary | ICD-10-CM | POA: Insufficient documentation

## 2016-06-16 DIAGNOSIS — I10 Essential (primary) hypertension: Secondary | ICD-10-CM | POA: Diagnosis not present

## 2016-06-16 DIAGNOSIS — R112 Nausea with vomiting, unspecified: Secondary | ICD-10-CM

## 2016-06-16 DIAGNOSIS — E1140 Type 2 diabetes mellitus with diabetic neuropathy, unspecified: Secondary | ICD-10-CM | POA: Diagnosis not present

## 2016-06-16 DIAGNOSIS — Z794 Long term (current) use of insulin: Secondary | ICD-10-CM | POA: Insufficient documentation

## 2016-06-16 DIAGNOSIS — K529 Noninfective gastroenteritis and colitis, unspecified: Secondary | ICD-10-CM | POA: Diagnosis not present

## 2016-06-16 DIAGNOSIS — R197 Diarrhea, unspecified: Secondary | ICD-10-CM

## 2016-06-16 LAB — CBC
HEMATOCRIT: 44.6 % (ref 39.0–52.0)
HEMOGLOBIN: 14.7 g/dL (ref 13.0–17.0)
MCH: 30.6 pg (ref 26.0–34.0)
MCHC: 33 g/dL (ref 30.0–36.0)
MCV: 92.9 fL (ref 78.0–100.0)
Platelets: 351 10*3/uL (ref 150–400)
RBC: 4.8 MIL/uL (ref 4.22–5.81)
RDW: 13.6 % (ref 11.5–15.5)
WBC: 12.4 10*3/uL — ABNORMAL HIGH (ref 4.0–10.5)

## 2016-06-16 LAB — COMPREHENSIVE METABOLIC PANEL
ALBUMIN: 3.5 g/dL (ref 3.5–5.0)
ALK PHOS: 103 U/L (ref 38–126)
ALT: 10 U/L — ABNORMAL LOW (ref 17–63)
ANION GAP: 9 (ref 5–15)
AST: 19 U/L (ref 15–41)
BUN: 11 mg/dL (ref 6–20)
CHLORIDE: 106 mmol/L (ref 101–111)
CO2: 23 mmol/L (ref 22–32)
Calcium: 8.7 mg/dL — ABNORMAL LOW (ref 8.9–10.3)
Creatinine, Ser: 1.05 mg/dL (ref 0.61–1.24)
GFR calc Af Amer: >60 mL/min (ref >60)
GFR calc non Af Amer: >60 mL/min (ref >60)
GLUCOSE: 195 mg/dL — AB (ref 65–99)
POTASSIUM: 4.1 mmol/L (ref 3.5–5.1)
SODIUM: 138 mmol/L (ref 135–145)
Total Bilirubin: 1.1 mg/dL (ref 0.3–1.2)
Total Protein: 6.8 g/dL (ref 6.5–8.1)

## 2016-06-16 LAB — LIPASE, BLOOD: LIPASE: 12 U/L (ref 11–51)

## 2016-06-16 LAB — URINALYSIS, MICROSCOPIC (REFLEX): Bacteria, UA: NONE SEEN

## 2016-06-16 LAB — URINALYSIS, ROUTINE W REFLEX MICROSCOPIC
BILIRUBIN URINE: NEGATIVE
Glucose, UA: NEGATIVE mg/dL
Ketones, ur: NEGATIVE mg/dL
Leukocytes, UA: NEGATIVE
Nitrite: NEGATIVE
PH: 5.5 (ref 5.0–8.0)
Protein, ur: NEGATIVE mg/dL
Specific Gravity, Urine: 1.01 (ref 1.005–1.030)

## 2016-06-16 MED ORDER — ONDANSETRON HCL 4 MG/2ML IJ SOLN
4.0000 mg | Freq: Once | INTRAMUSCULAR | Status: AC
  Administered 2016-06-16: 4 mg via INTRAVENOUS
  Filled 2016-06-16: qty 2

## 2016-06-16 MED ORDER — ONDANSETRON 4 MG PO TBDP: 4.0000 mg | ORAL_TABLET | Freq: Three times a day (TID) | ORAL | 0 refills | Status: DC | PRN

## 2016-06-16 MED ORDER — HYDROMORPHONE HCL 1 MG/ML IJ SOLN
1.0000 mg | Freq: Once | INTRAMUSCULAR | Status: AC
  Administered 2016-06-16: 1 mg via INTRAVENOUS
  Filled 2016-06-16: qty 1

## 2016-06-16 MED ORDER — IOPAMIDOL (ISOVUE-300) INJECTION 61%
INTRAVENOUS | Status: AC
  Administered 2016-06-16: 100 mL
  Filled 2016-06-16: qty 100

## 2016-06-16 MED ORDER — SODIUM CHLORIDE 0.9 % IV BOLUS (SEPSIS)
1000.0000 mL | Freq: Once | INTRAVENOUS | Status: AC
Start: 2016-06-16 — End: 2016-06-16
  Administered 2016-06-16: 1000 mL via INTRAVENOUS

## 2016-06-16 MED ORDER — ONDANSETRON 4 MG PO TBDP
4.0000 mg | ORAL_TABLET | Freq: Once | ORAL | Status: AC
  Administered 2016-06-16: 4 mg via ORAL
  Filled 2016-06-16: qty 1

## 2016-06-16 NOTE — ED Notes (Signed)
Pt verbalized understanding discharge instructions and denies any further needs or questions at this time. VS stable, ambulatory and steady gait.   

## 2016-06-16 NOTE — ED Provider Notes (Signed)
[facility] DEPT Provider Note   CSN: [id number] Arrival date & time: 06/16/16  1254     History   Chief Complaint Chief Complaint  Patient presents with  . Emesis    HPI David Wyatt is a 56 y.o. male.  The history is provided by the patient.  Emesis   This is a new problem. The current episode started 6 to 12 hours ago. The problem occurs continuously. The problem has not changed since onset.The emesis has an appearance of stomach contents. There has been no fever. Associated symptoms include abdominal pain, chills, diarrhea and sweats. Pertinent negatives include no arthralgias, no cough, no fever, no headaches, no myalgias and no URI. Risk factors: No sick contacts or recent suspicious food.    Past Medical History:  Diagnosis Date  . Angioedema of lips 07/28/2012   left upper (07/29/2012)  . Arthritis    "hands" (07/28/2012)  . Chronic back pain   . Cocaine abuse   . Hypertension   . Neuropathy ([place])   . Type II diabetes mellitus [facility])     Patient Active Problem List   Diagnosis Date Noted  . GERD (gastroesophageal reflux disease) 06/13/2015  . Controlled type 2 diabetes mellitus with complication, without long-term current use of insulin ([place]) 05/01/2015  . Diabetic neuropathy ([place]) 05/01/2015  . Essential hypertension, benign 11/01/2013  . Lumbar disc herniation 09/13/2013  . Unspecified constipation 09/13/2013  . Diabetes mellitus due to underlying condition without complications ([place]) 96/22/2979  . Chronic back pain 09/13/2013  . Viral gastroenteritis 05/05/2013  . Hypoglycemia 05/05/2013  . Felon of finger of left hand with lymphangitis 12/13/2012  . Hyperglycemia 07/29/2012  . Angioedema of lips 07/29/2012  . Crack cocaine use 07/29/2012  . Arthritis 07/29/2012  . Weakness generalized 07/29/2012    Past Surgical History:  Procedure Laterality Date  . HERNIA REPAIR Right 04/01/2012  . I&D EXTREMITY Left 12/13/2012   Procedure: IRRIGATION AND  DEBRIDEMENT LEFT THUMB;  Surgeon: Tennis Must, MD;  Location: [facility];  Service: Orthopedics;  Laterality: Left;  . INCISION AND DRAINAGE OF WOUND     boil on back/notes 07/14/2008 (07/29/2012)  . INGUINAL HERNIA REPAIR  04/01/2012   Procedure: HERNIA REPAIR INGUINAL ADULT;  Surgeon: Haywood Lasso, MD;  Location: [place];  Service: General;  Laterality: Right;       Home Medications    Prior to Admission medications   Medication Sig Start Date End Date Taking? Authorizing Provider  amLODipine (NORVASC) 2.5 MG tablet Take 1 tablet (2.5 mg total) by mouth daily. 05/16/16   Arnoldo Morale, MD  atorvastatin (LIPITOR) 40 MG tablet Take 1 tablet (40 mg total) by mouth daily. 05/23/16   Arnoldo Morale, MD  gabapentin (NEURONTIN) 300 MG capsule Take 1 capsule (300 mg total) by mouth 3 (three) times daily. 05/16/16   Arnoldo Morale, MD  insulin aspart (NOVOLOG FLEXPEN) 100 UNIT/ML FlexPen Inject 0-15 Units into the skin 3 (three) times daily with meals. According to sliding scale 05/16/16   Arnoldo Morale, MD  Insulin Glargine (LANTUS SOLOSTAR) 100 UNIT/ML Solostar Pen Inject 18 Units into the skin daily at 10 pm. 05/16/16   Arnoldo Morale, MD  metFORMIN (GLUCOPHAGE) 500 MG tablet Take 2 tablets (1,000 mg total) by mouth 2 (two) times daily with a meal. 05/16/16   Arnoldo Morale, MD  naproxen (NAPROSYN) 500 MG tablet Take 1 tablet (500 mg total) by mouth 2 (two) times daily. 05/16/16   Arnoldo Morale, MD  ondansetron (ZOFRAN ODT)  4 MG disintegrating tablet Take 1 tablet (4 mg total) by mouth every 8 (eight) hours as needed for nausea or vomiting. 06/16/16   Esaw Grandchild, MD  tiZANidine (ZANAFLEX) 4 MG tablet Take 1 tablet (4 mg total) by mouth every 8 (eight) hours as needed for muscle spasms. 05/16/16   Arnoldo Morale, MD    Family History Family History  Problem Relation Age of Onset  . Diabetes Father     Social History Social History  Substance Use Topics  . Smoking status: Current Every Day Smoker    Packs/day: 0.25      Years: 30.00    Types: Cigarettes    Last attempt to quit: 08/15/2015  . Smokeless tobacco: Former Systems developer    Quit date: 08/15/2015     Comment: 4 cigs daily  . Alcohol use No     Allergies   Lisinopril   Review of Systems Review of Systems  Constitutional: Positive for chills. Negative for fever.  HENT: Negative for ear pain and sore throat.   Eyes: Negative for pain and visual disturbance.  Respiratory: Negative for cough and shortness of breath.   Cardiovascular: Negative for chest pain and palpitations.  Gastrointestinal: Positive for abdominal pain, diarrhea, nausea and vomiting.  Genitourinary: Negative for dysuria and hematuria.  Musculoskeletal: Negative for arthralgias, back pain and myalgias.  Skin: Negative for color change and rash.  Neurological: Negative for seizures, syncope and headaches.  All other systems reviewed and are negative.    Physical Exam Updated Vital Signs BP 135/90   Pulse 75   Temp 99.3 F (37.4 C) (Oral)   Resp 17   SpO2 98%   Physical Exam  Constitutional: He appears well-developed and well-nourished.  HENT:  Head: Normocephalic and atraumatic.  Eyes: Conjunctivae are normal.  Neck: Neck supple.  Cardiovascular: Normal rate and regular rhythm.   No murmur heard. Pulmonary/Chest: Effort normal and breath sounds normal. No respiratory distress.  Abdominal: Soft. There is generalized tenderness.  Musculoskeletal: He exhibits no edema.  Neurological: He is alert.  Skin: Skin is warm and dry.  Psychiatric: He has a normal mood and affect.  Nursing note and vitals reviewed.    ED Treatments / Results  Labs (all labs ordered are listed, but only abnormal results are displayed) Labs Reviewed  COMPREHENSIVE METABOLIC PANEL - Abnormal; Notable for the following:       Result Value   Glucose, Bld 195 (*)    Calcium 8.7 (*)    ALT 10 (*)    All other components within normal limits  CBC - Abnormal; Notable for the following:     WBC 12.4 (*)    All other components within normal limits  URINALYSIS, ROUTINE W REFLEX MICROSCOPIC - Abnormal; Notable for the following:    Hgb urine dipstick TRACE (*)    All other components within normal limits  URINALYSIS, MICROSCOPIC (REFLEX) - Abnormal; Notable for the following:    Squamous Epithelial / LPF 0-5 (*)    All other components within normal limits  GASTROINTESTINAL PANEL BY PCR, STOOL (REPLACES STOOL CULTURE)  LIPASE, BLOOD    EKG  EKG Interpretation None       Radiology Ct Abdomen Pelvis W Contrast  Result Date: 06/16/2016 CLINICAL DATA:  Diffuse abdominal tenderness with elevated white blood cell count 1 day. Diarrhea. EXAM: CT ABDOMEN AND PELVIS WITH CONTRAST TECHNIQUE: Multidetector CT imaging of the abdomen and pelvis was performed using the standard protocol following bolus administration of intravenous contrast.  CONTRAST:  160mL ISOVUE-300 IOPAMIDOL (ISOVUE-300) INJECTION 61% COMPARISON:  03/06/2013 FINDINGS: Lower chest: Lung bases are within normal. Hepatobiliary: Within normal. Pancreas: Within normal. Spleen: Within normal. Adrenals/Urinary Tract: Adrenal glands are normal. Kidneys normal size without hydronephrosis. Nonobstructing punctate stone over the lower pole of the left kidney as well as 2 nonobstructing small stones over the lower pole of the right kidney. There are a few small left renal cortical hypodensities too small to characterize but likely cysts. There is a 2 cm homogeneously hypodense mass over the medial aspect of the mid to lower pole left kidney with Hounsfield unit measurements 65 increased in size compared to the previous exam. Ureters and bladder are within normal. Stomach/Bowel: Stomach is within normal. Small bowel is normal in caliber. There are a few small bowel loops in the left mid abdomen with nonspecific mild wall thickening. Appendix is normal. Colon is within normal and predominantly fluid-filled. No colonic wall thickening or  adjacent inflammatory change. Vascular/Lymphatic: Mild calcified plaque over the abdominal aorta and iliac arteries. No adenopathy. Reproductive: Within normal. Other: Moderate streak artifact from fusion hardware over the lower lumbar spine. Musculoskeletal: Mild degenerate change of the spine. Posterior fusion hardware at the L4-5 level. Disc space narrowing at the L5-S1 level. IMPRESSION: Minimal wall thickening of several small bowel loops in the left mid abdomen which are otherwise normal in caliber. Findings may be due to a regional enteritis of infectious or inflammatory nature. Indeterminate 2 cm mass over the anterior mid to lower pole left renal cortex increase in size compared to the previous exam. Consider MRI with and without contrast for further evaluation. Several subcentimeter left renal cortical hypodensities too small to characterize but likely cysts. Aortic atherosclerosis. Several bilateral small nonobstructing renal stones. Electronically Signed   By: Marin Olp M.D.   On: 06/16/2016 21:48    Procedures Procedures (including critical care time)  Medications Ordered in ED Medications  sodium chloride 0.9 % bolus 1,000 mL (0 mLs Intravenous Stopped 06/16/16 1941)  ondansetron (ZOFRAN) injection 4 mg (4 mg Intravenous Given 06/16/16 1854)  HYDROmorphone (DILAUDID) injection 1 mg (1 mg Intravenous Given 06/16/16 1955)  iopamidol (ISOVUE-300) 61 % injection (100 mLs  Contrast Given 06/16/16 2054)  ondansetron (ZOFRAN-ODT) disintegrating tablet 4 mg (4 mg Oral Given 06/16/16 2239)     Initial Impression / Assessment and Plan / ED Course  I have reviewed the triage vital signs and the nursing notes.  Pertinent labs & imaging results that were available during my care of the patient were reviewed by me and considered in my medical decision making (see chart for details).     56 year old black male with no significant past medical history presents in the setting of one day of nausea,  vomiting, diarrhea, generalized abdominal pain. Patient reports he awoke with the symptoms and has had continued symptoms throughout the day. Patient denies blood in stool or emesis. Patient denies recent sick contacts or recent travel.  On arrival patient hemodynamically stable and afebrile. On examination he is generalized abdominal tenderness. Labs with slight elevation in white blood cell count but no significant electrolyte abnormalities or kidney dysfunction. Patient given IV fluids and anti-medics with improvement in symptoms. GI pathogen panel sent for analysis but results not available at time of discharge. CT scan obtained which is consistent with enteritis. Likely viral syndrome in the setting of abrupt onset of symptoms. Additionally patient noted to have incidental finding of kidney abnormality. Patient advised to follow with PCP for this  and understands the importance of follow-up.  We'll discharge home a short course of antibiotics and plan see PCP for further management of this condition. Strict return impressions given and patient stable at time of discharge. Patient able to tolerate by mouth intake prior to discharge.  Final Clinical Impressions(s) / ED Diagnoses   Final diagnoses:  Nausea vomiting and diarrhea  Enteritis    New Prescriptions New Prescriptions   ONDANSETRON (ZOFRAN ODT) 4 MG DISINTEGRATING TABLET    Take 1 tablet (4 mg total) by mouth every 8 (eight) hours as needed for nausea or vomiting.     Esaw Grandchild, MD 06/16/16 [place], MD 06/17/16 2003

## 2016-06-16 NOTE — ED Triage Notes (Signed)
States vomiting and diarrhea since this am

## 2016-06-16 NOTE — ED Notes (Signed)
Pt came to staff asking about delay. Pt made aware of the wait and told that his name was placed in a room and we were just waiting on staff to come and take him to the room.

## 2016-06-16 NOTE — ED Notes (Signed)
Pt feels less nauseous but still c/o of abd pain.

## 2016-06-17 LAB — GASTROINTESTINAL PANEL BY PCR, STOOL (REPLACES STOOL CULTURE)
ADENOVIRUS F40/41: NOT DETECTED
ASTROVIRUS: NOT DETECTED
CAMPYLOBACTER SPECIES: NOT DETECTED
Cryptosporidium: NOT DETECTED
Cyclospora cayetanensis: NOT DETECTED
ENTAMOEBA HISTOLYTICA: NOT DETECTED
ENTEROPATHOGENIC E COLI (EPEC): NOT DETECTED
ENTEROTOXIGENIC E COLI (ETEC): NOT DETECTED
Enteroaggregative E coli (EAEC): NOT DETECTED
Giardia lamblia: NOT DETECTED
NOROVIRUS GI/GII: NOT DETECTED
PLESIMONAS SHIGELLOIDES: NOT DETECTED
Rotavirus A: NOT DETECTED
SAPOVIRUS (I, II, IV, AND V): NOT DETECTED
SHIGA LIKE TOXIN PRODUCING E COLI (STEC): NOT DETECTED
Salmonella species: NOT DETECTED
Shigella/Enteroinvasive E coli (EIEC): NOT DETECTED
VIBRIO CHOLERAE: NOT DETECTED
Vibrio species: NOT DETECTED
Yersinia enterocolitica: NOT DETECTED

## 2016-06-17 MED FILL — tiZANidine HCL 4 MG TABS: 4 | 30 days supply | Qty: 90 | Fill #0

## 2016-06-17 MED FILL — LANTUS SOLOSTAR 100 UNITS/M: 100 | 32 days supply | Qty: 6 | Fill #0

## 2016-06-17 MED FILL — GABAPENTIN 300 MG CAPSULE: 300 | 30 days supply | Qty: 90 | Fill #0

## 2016-06-17 MED FILL — metFORMIN HCL 500 MG TABS: 500 | 30 days supply | Qty: 120 | Fill #0

## 2016-06-17 MED FILL — AMLODIPINE BESYLATE 2.5 MG: 2.5 | 30 days supply | Qty: 30 | Fill #0

## 2016-06-17 MED FILL — ONDANSETRON ODT 4 MG TABLET: 4 | 7 days supply | Qty: 20 | Fill #0

## 2016-06-17 MED FILL — NAPROXEN 500 MG TABLET: 500 | 30 days supply | Qty: 60 | Fill #0

## 2016-06-17 MED FILL — NOVOLOG FLEXPEN SYRINGE: 100 | 33 days supply | Qty: 15 | Fill #0

## 2016-06-17 MED FILL — ATORVASTATIN 40 MG TABLET: 40 | 30 days supply | Qty: 30 | Fill #0

## 2016-07-16 ENCOUNTER — Emergency Department (HOSPITAL_COMMUNITY)
Admission: EM | Admit: 2016-07-16 | Discharge: 2016-07-16 | Disposition: A | Discharge status: Home / Self Care | Payer: Medicaid Other | Attending: Emergency Medicine | Admitting: Emergency Medicine

## 2016-07-16 ENCOUNTER — Encounter (HOSPITAL_COMMUNITY): Payer: Self-pay

## 2016-07-16 DIAGNOSIS — K4090 Unilateral inguinal hernia, without obstruction or gangrene, not specified as recurrent: Secondary | ICD-10-CM | POA: Insufficient documentation

## 2016-07-16 DIAGNOSIS — Z794 Long term (current) use of insulin: Secondary | ICD-10-CM | POA: Diagnosis not present

## 2016-07-16 DIAGNOSIS — E1140 Type 2 diabetes mellitus with diabetic neuropathy, unspecified: Secondary | ICD-10-CM | POA: Diagnosis not present

## 2016-07-16 DIAGNOSIS — I10 Essential (primary) hypertension: Secondary | ICD-10-CM | POA: Diagnosis not present

## 2016-07-16 DIAGNOSIS — Z87891 Personal history of nicotine dependence: Secondary | ICD-10-CM | POA: Diagnosis not present

## 2016-07-16 DIAGNOSIS — Z79899 Other long term (current) drug therapy: Secondary | ICD-10-CM | POA: Insufficient documentation

## 2016-07-16 LAB — CBG MONITORING, ED: GLUCOSE-CAPILLARY: 181 mg/dL — AB (ref 65–99)

## 2016-07-16 NOTE — ED Provider Notes (Signed)
[facility] DEPT Provider Note   CSN: [id number] Arrival date & time: 07/16/16  1330  By signing my name below, I, Sonum Patel, attest that this documentation has been prepared under the direction and in the presence of Virgel Manifold, MD. Electronically Signed: Sonum Patel, Education administrator. 07/16/16. 4:04 PM.  History   Chief Complaint Chief Complaint  Patient presents with  . Hernia    The history is provided by the patient. No language interpreter was used.     HPI Comments: Decklyn Hyder is a 56 y.o. male who presents to the Emergency Department complaining of a knot to the left inguinal area that he noticed this morning. He was seen by his PCP who advised he be seen in the ED. Patient believes he had a hernia but states it reduced PTA. He reports a history of a right inguinal hernia and umbilical hernia which have required surgery. He denies any associated symptoms at this time.   Past Medical History:  Diagnosis Date  . Angioedema of lips 07/28/2012   left upper (07/29/2012)  . Arthritis    "hands" (07/28/2012)  . Chronic back pain   . Cocaine abuse   . Hypertension   . Neuropathy   . Type II diabetes mellitus [facility])     Patient Active Problem List   Diagnosis Date Noted  . GERD (gastroesophageal reflux disease) 06/13/2015  . Controlled type 2 diabetes mellitus with complication, without long-term current use of insulin ([place]) 05/01/2015  . Diabetic neuropathy ([place]) 05/01/2015  . Essential hypertension, benign 11/01/2013  . Lumbar disc herniation 09/13/2013  . Unspecified constipation 09/13/2013  . Diabetes mellitus due to underlying condition without complications ([place]) 99/37/1696  . Chronic back pain 09/13/2013  . Viral gastroenteritis 05/05/2013  . Hypoglycemia 05/05/2013  . Felon of finger of left hand with lymphangitis 12/13/2012  . Hyperglycemia 07/29/2012  . Angioedema of lips 07/29/2012  . Crack cocaine use 07/29/2012  . Arthritis 07/29/2012  . Weakness generalized  07/29/2012    Past Surgical History:  Procedure Laterality Date  . BACK SURGERY    . HERNIA REPAIR Right 04/01/2012  . I&D EXTREMITY Left 12/13/2012   Procedure: IRRIGATION AND DEBRIDEMENT LEFT THUMB;  Surgeon: Tennis Must, MD;  Location: [place];  Service: Orthopedics;  Laterality: Left;  . INCISION AND DRAINAGE OF WOUND     boil on back/notes 07/14/2008 (07/29/2012)  . INGUINAL HERNIA REPAIR  04/01/2012   Procedure: HERNIA REPAIR INGUINAL ADULT;  Surgeon: Haywood Lasso, MD;  Location: [place];  Service: General;  Laterality: Right;       Home Medications    Prior to Admission medications   Medication Sig Start Date End Date Taking? Authorizing Provider  amLODipine (NORVASC) 2.5 MG tablet Take 1 tablet (2.5 mg total) by mouth daily. 05/16/16   Arnoldo Morale, MD  atorvastatin (LIPITOR) 40 MG tablet Take 1 tablet (40 mg total) by mouth daily. 05/23/16   Arnoldo Morale, MD  gabapentin (NEURONTIN) 300 MG capsule Take 1 capsule (300 mg total) by mouth 3 (three) times daily. 05/16/16   Arnoldo Morale, MD  insulin aspart (NOVOLOG FLEXPEN) 100 UNIT/ML FlexPen Inject 0-15 Units into the skin 3 (three) times daily with meals. According to sliding scale 05/16/16   Arnoldo Morale, MD  Insulin Glargine (LANTUS SOLOSTAR) 100 UNIT/ML Solostar Pen Inject 18 Units into the skin daily at 10 pm. 05/16/16   Arnoldo Morale, MD  metFORMIN (GLUCOPHAGE) 500 MG tablet Take 2 tablets (1,000 mg total) by mouth 2 (two)  times daily with a meal. 05/16/16   Arnoldo Morale, MD  naproxen (NAPROSYN) 500 MG tablet Take 1 tablet (500 mg total) by mouth 2 (two) times daily. 05/16/16   Arnoldo Morale, MD  ondansetron (ZOFRAN ODT) 4 MG disintegrating tablet Take 1 tablet (4 mg total) by mouth every 8 (eight) hours as needed for nausea or vomiting. 06/16/16   Esaw Grandchild, MD  tiZANidine (ZANAFLEX) 4 MG tablet Take 1 tablet (4 mg total) by mouth every 8 (eight) hours as needed for muscle spasms. 05/16/16   Arnoldo Morale, MD    Family  History Family History  Problem Relation Age of Onset  . Diabetes Father     Social History Social History  Substance Use Topics  . Smoking status: Former Smoker    Packs/day: 0.25    Years: 30.00    Types: Cigarettes    Quit date: 08/15/2015  . Smokeless tobacco: Former Systems developer    Quit date: 08/15/2015     Comment: 4 cigs daily  . Alcohol use No     Allergies   Lisinopril   Review of Systems Review of Systems  All other systems reviewed and are negative for acute change except as noted in the HPI.   Physical Exam Updated Vital Signs BP (!) 155/91 (BP Location: Left Arm)   Pulse 86   Temp 99.1 F (37.3 C) (Oral)   Resp 16   Ht 5\' 8"  (1.727 m)   Wt 160 lb (72.6 kg)   SpO2 99%   BMI 24.33 kg/m   Physical Exam  Constitutional: He is oriented to person, place, and time. He appears well-developed and well-nourished.  HENT:  Head: Normocephalic and atraumatic.  Eyes: EOM are normal.  Neck: Normal range of motion.  Cardiovascular: Normal rate, regular rhythm, normal heart sounds and intact distal pulses.   Pulmonary/Chest: Effort normal and breath sounds normal. No respiratory distress.  Abdominal: Soft. He exhibits no distension. There is no tenderness.  Genitourinary:  Genitourinary Comments: Linear scar to right inguinal crease. Defect felt in the left inguinal canal.   Musculoskeletal: Normal range of motion.  Neurological: He is alert and oriented to person, place, and time.  Skin: Skin is warm and dry.  Psychiatric: He has a normal mood and affect. Judgment normal.  Nursing note and vitals reviewed.    ED Treatments / Results  DIAGNOSTIC STUDIES: Oxygen Saturation is 99% on RA, normal by my interpretation.    COORDINATION OF CARE: 4:04 PM Discussed treatment plan with pt at bedside and pt agreed to plan.   Labs (all labs ordered are listed, but only abnormal results are displayed) Labs Reviewed  CBG MONITORING, ED - Abnormal; Notable for the following:        Result Value   Glucose-Capillary 181 (*)    All other components within normal limits    EKG  EKG Interpretation None       Radiology No results found.  Procedures Procedures (including critical care time)  Medications Ordered in ED Medications - No data to display   Initial Impression / Assessment and Plan / ED Course  I have reviewed the triage vital signs and the nursing notes.  Pertinent labs & imaging results that were available during my care of the patient were reviewed by me and considered in my medical decision making (see chart for details).    77yM with inguinal hernia. Reduces. Return precautions discussed. Outpt surgical Fu otherwise.   Final Clinical Impressions(s) / ED Diagnoses  Final diagnoses:  Unilateral inguinal hernia without obstruction or gangrene, recurrence not specified    New Prescriptions New Prescriptions   No medications on file   I personally preformed the services scribed in my presence. The recorded information has been reviewed is accurate. Virgel Manifold, MD.    Virgel Manifold, MD 07/21/16 [phone]

## 2016-07-16 NOTE — ED Notes (Signed)
Pt states he understands instructions HOme stable with steady gait with cane.

## 2016-07-16 NOTE — ED Triage Notes (Signed)
Per Pt, Pt is coming from home with reports of mass located in left groin that he was able to put back into place. Pt denies pain at this time. Reports hx of surgery 3 years ago.

## 2016-07-30 ENCOUNTER — Telehealth: Payer: Self-pay | Admitting: Family Medicine

## 2016-07-30 NOTE — Telephone Encounter (Signed)
This cannot be done over the phone and will entail an office visit to discuss.

## 2016-07-30 NOTE — Telephone Encounter (Signed)
Please advise on insulin therapy being needed at this time. Patient would like to check into a treatment facility but is unable to due to being treated with insulin at this time.

## 2016-07-30 NOTE — Telephone Encounter (Signed)
Patient will need to discuss with provider at an OV. Please schedule.

## 2016-07-30 NOTE — Telephone Encounter (Signed)
Pt. Called requesting to speak with his PCP. Pt. States that the want to go to a treatment center but the center will not accept him b/c he is taking insulin. Pt. Would like to know if he can take pills. Please f/u with pt.

## 2016-08-13 ENCOUNTER — Encounter: Payer: Self-pay | Admitting: Family Medicine

## 2016-08-13 ENCOUNTER — Ambulatory Visit: Payer: Medicaid Other | Attending: Family Medicine | Admitting: Family Medicine

## 2016-08-13 VITALS — BP 122/66 | HR 76 | Temp 98.0°F | Resp 18 | Ht 68.0 in | Wt 165.0 lb

## 2016-08-13 DIAGNOSIS — K4090 Unilateral inguinal hernia, without obstruction or gangrene, not specified as recurrent: Secondary | ICD-10-CM

## 2016-08-13 DIAGNOSIS — M5441 Lumbago with sciatica, right side: Secondary | ICD-10-CM

## 2016-08-13 DIAGNOSIS — F1410 Cocaine abuse, uncomplicated: Secondary | ICD-10-CM | POA: Diagnosis not present

## 2016-08-13 DIAGNOSIS — M5442 Lumbago with sciatica, left side: Secondary | ICD-10-CM

## 2016-08-13 DIAGNOSIS — Z794 Long term (current) use of insulin: Secondary | ICD-10-CM | POA: Insufficient documentation

## 2016-08-13 DIAGNOSIS — E1140 Type 2 diabetes mellitus with diabetic neuropathy, unspecified: Secondary | ICD-10-CM | POA: Insufficient documentation

## 2016-08-13 DIAGNOSIS — G8929 Other chronic pain: Secondary | ICD-10-CM

## 2016-08-13 DIAGNOSIS — E118 Type 2 diabetes mellitus with unspecified complications: Secondary | ICD-10-CM | POA: Diagnosis not present

## 2016-08-13 DIAGNOSIS — E1149 Type 2 diabetes mellitus with other diabetic neurological complication: Secondary | ICD-10-CM

## 2016-08-13 DIAGNOSIS — I10 Essential (primary) hypertension: Secondary | ICD-10-CM

## 2016-08-13 LAB — POCT GLYCOSYLATED HEMOGLOBIN (HGB A1C): Hemoglobin A1C: 7.1

## 2016-08-13 LAB — GLUCOSE, POCT (MANUAL RESULT ENTRY): POC Glucose: 131 mg/dl — AB (ref 70–99)

## 2016-08-13 MED ORDER — GLIPIZIDE 5 MG PO TABS: 2.5000 mg | ORAL_TABLET | Freq: Every day | ORAL | 3 refills | Status: DC

## 2016-08-13 MED ORDER — TIZANIDINE HCL 4 MG PO TABS: 4.0000 mg | ORAL_TABLET | Freq: Three times a day (TID) | ORAL | 3 refills | Status: DC | PRN

## 2016-08-13 MED ORDER — AMLODIPINE BESYLATE 2.5 MG PO TABS: 2.5000 mg | ORAL_TABLET | Freq: Every day | ORAL | 3 refills | Status: DC

## 2016-08-13 MED ORDER — GABAPENTIN 300 MG PO CAPS: 300.0000 mg | ORAL_CAPSULE | Freq: Three times a day (TID) | ORAL | 3 refills | Status: DC

## 2016-08-13 MED ORDER — METFORMIN HCL 500 MG PO TABS: 1000.0000 mg | ORAL_TABLET | Freq: Two times a day (BID) | ORAL | 3 refills | Status: DC

## 2016-08-13 MED ORDER — ATORVASTATIN CALCIUM 40 MG PO TABS: 40.0000 mg | ORAL_TABLET | Freq: Every day | ORAL | 3 refills | Status: DC

## 2016-08-13 MED FILL — metFORMIN HCL 500 MG TABS: 500 | 30 days supply | Qty: 120 | Fill #0

## 2016-08-13 MED FILL — ATORVASTATIN 40 MG TABLET: 40 | 30 days supply | Qty: 30 | Fill #0

## 2016-08-13 MED FILL — AMLODIPINE BESYLATE 2.5 MG: 2.5 | 30 days supply | Qty: 30 | Fill #0

## 2016-08-13 MED FILL — GABAPENTIN 300 MG CAPSULE: 300 | 30 days supply | Qty: 90 | Fill #0

## 2016-08-13 MED FILL — glipiZIDE 5 MG TABS: 5 | 30 days supply | Qty: 15 | Fill #0

## 2016-08-13 MED FILL — tiZANidine HCL 4 MG TABS: 4 | 30 days supply | Qty: 90 | Fill #0

## 2016-08-13 NOTE — Progress Notes (Signed)
Subjective:  Patient ID: David Wyatt, male    DOB: [birth year]  Age: 56 y.o. MRN: [id number]  CC: Medication Management and Hernia   HPI David Wyatt is a 56 year old male with a history of type 2 diabetes mellitus (A1c 6.8), diabetic neuropathy, hypertension, chronic low back pain (status post previous back surgery) who comes into the clinic for follow-up visit.  Last month he had an ED visit for left inguinal pain where he was diagnosed with left inguinal hernia with recommendations to be referred to general surgery outpatient. He endorses having a lump on his left inguinal region which hurts when he coughs or sneezes but lump is reducible.  With regards to his medications he has not been taking his Lantus or his other medications because [place] states [place].  He is in the process of being admitted into inpatient drug rehabilitation and will need to be taken off insulin to facilitate this.  Past Medical History:  Diagnosis Date  . Angioedema of lips 07/28/2012   left upper (07/29/2012)  . Arthritis    "hands" (07/28/2012)  . Chronic back pain   . Cocaine abuse   . Hypertension   . Neuropathy   . Type II diabetes mellitus ([place])     Past Surgical History:  Procedure Laterality Date  . BACK SURGERY    . HERNIA REPAIR Right 04/01/2012  . I&D EXTREMITY Left 12/13/2012   Procedure: IRRIGATION AND DEBRIDEMENT LEFT THUMB;  Surgeon: Tennis Must, MD;  Location: [facility];  Service: Orthopedics;  Laterality: Left;  . INCISION AND DRAINAGE OF WOUND     boil on back/notes 07/14/2008 (07/29/2012)  . INGUINAL HERNIA REPAIR  04/01/2012   Procedure: HERNIA REPAIR INGUINAL ADULT;  Surgeon: Haywood Lasso, MD;  Location: [facility];  Service: General;  Laterality: Right;    Allergies  Allergen Reactions  . Lisinopril Other (See Comments)    Lip Swelling     Outpatient Medications Prior to Visit  Medication Sig Dispense Refill  . naproxen (NAPROSYN) 500 MG tablet  Take 1 tablet (500 mg total) by mouth 2 (two) times daily. 60 tablet 3  . ondansetron (ZOFRAN ODT) 4 MG disintegrating tablet Take 1 tablet (4 mg total) by mouth every 8 (eight) hours as needed for nausea or vomiting. 20 tablet 0  . amLODipine (NORVASC) 2.5 MG tablet Take 1 tablet (2.5 mg total) by mouth daily. 30 tablet 3  . atorvastatin (LIPITOR) 40 MG tablet Take 1 tablet (40 mg total) by mouth daily. 30 tablet 3  . gabapentin (NEURONTIN) 300 MG capsule Take 1 capsule (300 mg total) by mouth 3 (three) times daily. 90 capsule 3  . insulin aspart (NOVOLOG FLEXPEN) 100 UNIT/ML FlexPen Inject 0-15 Units into the skin 3 (three) times daily with meals. According to sliding scale 15 mL 3  . Insulin Glargine (LANTUS SOLOSTAR) 100 UNIT/ML Solostar Pen Inject 18 Units into the skin daily at 10 pm. 5 pen 3  . metFORMIN (GLUCOPHAGE) 500 MG tablet Take 2 tablets (1,000 mg total) by mouth 2 (two) times daily with a meal. 120 tablet 3  . tiZANidine (ZANAFLEX) 4 MG tablet Take 1 tablet (4 mg total) by mouth every 8 (eight) hours as needed for muscle spasms. 90 tablet 3   No facility-administered medications prior to visit.     ROS Review of Systems Constitutional: Negative for activity change and appetite change.  HENT: Negative for sinus pressure and sore throat.  Eyes: Negative for visual disturbance.  Respiratory: Negative for cough, chest tightness and shortness of breath.   Cardiovascular: Negative for chest pain and leg swelling.  Gastrointestinal: Negative for abdominal distention, abdominal pain, constipation and diarrhea.  Endocrine: Negative.   Genitourinary: Negative for dysuria.  positive for inguinal hernia Musculoskeletal: Positive for back pain. Negative for joint swelling and myalgias.  Skin: Negative for rash.  Allergic/Immunologic: Negative.   Neurological: Positive for numbness. Negative for weakness and light-headedness Psych: Normal  Objective:  BP 122/66 (BP Location: Left  Arm, Patient Position: Sitting, Cuff Size: Normal)   Pulse 76   Temp 98 F (36.7 C) (Oral)   Resp 18   Ht 5\' 8"  (1.727 m)   Wt 165 lb (74.8 kg)   SpO2 97%   BMI 25.09 kg/m   BP/Weight 08/13/2016 03/11/5518 8/0/2233  Systolic BP 612 244 975  Diastolic BP 66 78 90  Wt. (Lbs) 165 160 -  BMI 25.09 24.33 -      Physical Exam Constitutional: He is oriented to person, place, and time. He appears well-developed and well-nourished.  Cardiovascular: Normal rate, normal heart sounds and intact distal pulses.   No murmur heard. Pulmonary/Chest: Effort normal and breath sounds normal. He has no wheezes. He has no rales. He exhibits no tenderness.  Abdominal: Soft. Bowel sounds are normal. He exhibits no distension and no mass. There is no tenderness.  Inguinal: Reducible left inguinal hernia, nontender  Musculoskeletal: Palpable bony protrusion on the left side of the pelvic bone which is tender Decreased range of motion. Vertical lumbar surgical scar He exhibits tenderness (on palpation of lumbar spine; positive straight leg raise on the right).  Neurological: He is alert and oriented to person, place, and time, ambulates bending over while holding a cane Psych: Normal  Lab Results  Component Value Date   HGBA1C 7.1 08/13/2016    CMP Latest Ref Rng & Units 06/16/2016 05/22/2016 03/11/2016  Glucose 65 - 99 mg/dL 195(H) 96 90  BUN 6 - 20 mg/dL 11 16 16   Creatinine 0.61 - 1.24 mg/dL 1.05 1.15 1.24  Sodium 135 - 145 mmol/L 138 141 144  Potassium 3.5 - 5.1 mmol/L 4.1 4.2 3.9  Chloride 101 - 111 mmol/L 106 106 109  CO2 22 - 32 mmol/L 23 25 28   Calcium 8.9 - 10.3 mg/dL 8.7(L) 8.8 9.5  Total Protein 6.5 - 8.1 g/dL 6.8 6.9 7.9  Total Bilirubin 0.3 - 1.2 mg/dL 1.1 0.3 0.8  Alkaline Phos 38 - 126 U/L 103 110 100  AST 15 - 41 U/L 19 12 28   ALT 17 - 63 U/L 10(L) 8(L) 17    Lipid Panel     Component Value Date/Time   CHOL 211 (H) 05/22/2016 0852   TRIG 128 05/22/2016 0852   HDL 59 05/22/2016  0852   CHOLHDL 3.6 05/22/2016 0852   VLDL 32 (H) 05/03/2015 0954   LDLCALC 57 05/03/2015 0954    Assessment & Plan:   1. Controlled type 2 diabetes mellitus with complication, without long-term current use of insulin (HCC) Controlled with A1c of 7.1 Emphasized the need to be compliant with medications I have discontinued his Lantus and NovoLog as he has not been taking them and A1c is 7.1 He will also need to be off insulin to facilitate admission into drug rehabilitation Added glipizide to regimen; discussed hypoglycemic symptoms and he knows to discontinue this if that occurs. - POCT A1C - Glucose (CBG) - metFORMIN (GLUCOPHAGE) 500 MG tablet; Take  2 tablets (1,000 mg total) by mouth 2 (two) times daily with a meal.  Dispense: 120 tablet; Refill: 3 - atorvastatin (LIPITOR) 40 MG tablet; Take 1 tablet (40 mg total) by mouth daily.  Dispense: 30 tablet; Refill: 3 - glipiZIDE (GLUCOTROL) 5 MG tablet; Take 0.5 tablets (2.5 mg total) by mouth daily before breakfast.  Dispense: 30 tablet; Refill: 3  2. Chronic bilateral low back pain with bilateral sciatica Stable - tiZANidine (ZANAFLEX) 4 MG tablet; Take 1 tablet (4 mg total) by mouth every 8 (eight) hours as needed for muscle spasms.  Dispense: 90 tablet; Refill: 3 - gabapentin (NEURONTIN) 300 MG capsule; Take 1 capsule (300 mg total) by mouth 3 (three) times daily.  Dispense: 90 capsule; Refill: 3  3. Essential hypertension, benign Controlled Low-sodium diet - amLODipine (NORVASC) 2.5 MG tablet; Take 1 tablet (2.5 mg total) by mouth daily.  Dispense: 30 tablet; Refill: 3  4. Left inguinal hernia - Ambulatory referral to General Surgery  5. Other diabetic neurological complication associated with type 2 diabetes mellitus ([place]) Continue gabapentin   Meds ordered this encounter  Medications  . tiZANidine (ZANAFLEX) 4 MG tablet    Sig: Take 1 tablet (4 mg total) by mouth every 8 (eight) hours as needed for muscle spasms.     Dispense:  90 tablet    Refill:  3  . metFORMIN (GLUCOPHAGE) 500 MG tablet    Sig: Take 2 tablets (1,000 mg total) by mouth 2 (two) times daily with a meal.    Dispense:  120 tablet    Refill:  3  . gabapentin (NEURONTIN) 300 MG capsule    Sig: Take 1 capsule (300 mg total) by mouth 3 (three) times daily.    Dispense:  90 capsule    Refill:  3  . atorvastatin (LIPITOR) 40 MG tablet    Sig: Take 1 tablet (40 mg total) by mouth daily.    Dispense:  30 tablet    Refill:  3    Discontinue previous dose  . amLODipine (NORVASC) 2.5 MG tablet    Sig: Take 1 tablet (2.5 mg total) by mouth daily.    Dispense:  30 tablet    Refill:  3  . glipiZIDE (GLUCOTROL) 5 MG tablet    Sig: Take 0.5 tablets (2.5 mg total) by mouth daily before breakfast.    Dispense:  30 tablet    Refill:  3    Discontinue Lantus, discontinue  NovoLog    Follow-up: Return in about 3 months (around 11/13/2016) for Follow-up on diabetes mellitus.   This note has been created with Surveyor, quantity. Any transcriptional errors are unintentional.     Arnoldo Morale MD

## 2016-08-13 NOTE — Progress Notes (Signed)
Patient is here for Hernia and Medication Management,  Patient complains of intermittent pain in the abdominal area and a bulge he noticed a few weeks ago. Coughing, laughing and sneezing cause pain.  Patient would like to be checked into a treatment facility and needs management of his diabetic medications.  Patient has not taken medication today. Patient has eaten today.

## 2016-08-13 NOTE — Patient Instructions (Signed)
Diabetic Neuropathy Diabetic neuropathy is a nerve disease or nerve damage that is caused by diabetes mellitus. About half of all people with diabetes mellitus have some form of nerve damage. Nerve damage is more common in those who have had diabetes mellitus for many years and who generally have not had good control of their blood sugar (glucose) level. Diabetic neuropathy is a common complication of diabetes mellitus. There are three common types of diabetic neuropathy and a fourth type that is less common and less understood:  Peripheral neuropathy-This is the most common type of diabetic neuropathy. It causes damage to the nerves of the feet and legs first and then eventually the hands and arms. The damage affects the ability to sense touch.  Autonomic neuropathy-This type causes damage to the autonomic nervous system, which controls the following functions: ? Heartbeat. ? Body temperature. ? Blood pressure. ? Urination. ? Digestion. ? Sweating. ? Sexual function.  Focal neuropathy-Focal neuropathy can be painful and unpredictable and occurs most often in older adults with diabetes mellitus. It involves a specific nerve or one area and often comes on suddenly. It usually does not cause long-term problems.  Radiculoplexus neuropathy- Sometimes called lumbosacral radiculoplexus neuropathy, radiculoplexus neuropathy affects the nerves of the thighs, hips, buttocks, or legs. It is more common in people with type 2 diabetes mellitus and in older men. It is characterized by debilitating pain, weakness, and atrophy, usually in the thigh muscles.  What are the causes? The cause of peripheral, autonomic, and focal neuropathies is diabetes mellitus that is uncontrolled and high glucose levels. The cause of radiculoplexus neuropathy is unknown. However, it is thought to be caused by inflammation related to uncontrolled glucose levels. What are the signs or symptoms? Peripheral Neuropathy Peripheral  neuropathy develops slowly over time. When the nerves of the feet and legs no longer work there may be:  Burning, stabbing, or aching pain in the legs or feet.  Inability to feel pressure or pain in your feet. This can lead to: ? Thick calluses over pressure areas. ? Pressure sores. ? Ulcers.  Foot deformities.  Reduced ability to feel temperature changes.  Muscle weakness.  Autonomic Neuropathy The symptoms of autonomic neuropathy vary depending on which nerves are affected. Symptoms may include:  Problems with digestion, such as: ? Feeling sick to your stomach (nausea). ? Vomiting. ? Bloating. ? Constipation. ? Diarrhea. ? Abdominal pain.  Difficulty with urination. This occurs if you lose your ability to sense when your bladder is full. Problems include: ? Urine leakage (incontinence). ? Inability to empty your bladder completely (retention).  Rapid or irregular heartbeat (palpitations).  Blood pressure drops when you stand up (orthostatic hypotension). When you stand up you may feel: ? Dizzy. ? Weak. ? Faint.  In men, inability to attain and maintain an erection.  In women, vaginal dryness and problems with decreased sexual desire and arousal.  Problems with body temperature regulation.  Increased or decreased sweating.  Focal Neuropathy  Abnormal eye movements or abnormal alignment of both eyes.  Weakness in the wrist.  Foot drop. This results in an inability to lift the foot properly and abnormal walking or foot movement.  Paralysis on one side of your face (Bell palsy).  Chest or abdominal pain. Radiculoplexus Neuropathy  Sudden, severe pain in your hip, thigh, or buttocks.  Weakness and wasting of thigh muscles.  Difficulty rising from a seated position.  Abdominal swelling.  Unexplained weight loss (usually more than 10 lb [4.5 kg]). How is   this diagnosed? Peripheral Neuropathy Your senses may be tested. Sensory function testing can be  done with:  A light touch using a monofilament.  A vibration with tuning fork.  A sharp sensation with a pin prick.  Other tests that can help diagnose neuropathy are:  Nerve conduction velocity. This test checks the transmission of an electrical current through a nerve.  Electromyography. This shows how muscles respond to electrical signals transmitted by nearby nerves.  Quantitative sensory testing. This is used to assess how your nerves respond to vibrations and changes in temperature.  Autonomic Neuropathy Diagnosis is often based on reported symptoms. Tell your health care provider if you experience:  Dizziness.  Constipation.  Diarrhea.  Inappropriate urination or inability to urinate.  Inability to get or maintain an erection.  Tests that may be done include:  Electrocardiography or Holter monitor. These are tests that can help show problems with the heart rate or heart rhythm.  An X-ray exam may be done.  Focal Neuropathy Diagnosis is made based on your symptoms and what your health care provider finds during your exam. Other tests may be done. They may include:  Nerve conduction velocities. This checks the transmission of electrical current through a nerve.  Electromyography. This shows how muscles respond to electrical signals transmitted by nearby nerves.  Quantitative sensory testing. This test is used to assess how your nerves respond to vibration and changes in temperature.  Radiculoplexus Neuropathy  Often the first thing is to eliminate any other issue or problems that might be the cause, as there is no standard test for diagnosis.  X-ray exam of your spine and lumbar region.  Spinal tap to rule out cancer.  MRI to rule out other lesions. How is this treated? Once nerve damage occurs, it cannot be reversed. The goal of treatment is to keep the disease or nerve damage from getting worse and affecting more nerve fibers. Controlling your blood  glucose level is the key. Most people with radiculoplexus neuropathy see at least a partial improvement over time. You will need to keep your blood glucose and HbA1c levels in the target range determined by your health care provider. Things that help control blood glucose levels include:  Blood glucose monitoring.  Meal planning.  Physical activity.  Diabetes medicine.  Over time, maintaining lower blood glucose levels helps lessen symptoms. Sometimes, prescription pain medicine is needed. Follow these instructions at home:  Do not smoke.  Keep your blood glucose level in the range that you and your health care provider have determined acceptable for you.  Keep your blood pressure level in the range that you and your health care provider have determined acceptable for you.  Eat a well-balanced diet.  Be physically active every day. Include strength training and balance exercises.  Protect your feet. ? Check your feet every day for sores, cuts, blisters, or signs of infection. ? Wear padded socks and supportive shoes. Use orthotic inserts, if necessary. ? Regularly check the insides of your shoes for worn spots. Make sure there are no rocks or other items inside your shoes before you put them on. Contact a health care provider if:  You have burning, stabbing, or aching pain in the legs or feet.  You are unable to feel pressure or pain in your feet.  You develop problems with digestion such as: ? Nausea. ? Vomiting. ? Bloating. ? Constipation. ? Diarrhea. ? Abdominal pain.  You have difficulty with urination, such as: ? Incontinence. ? Retention.    You have palpitations.  You develop orthostatic hypotension. When you stand up you may feel: ? Dizzy. ? Weak. ? Faint.  You cannot attain and maintain an erection (in men).  You have vaginal dryness and problems with decreased sexual desire and arousal (in women).  You have severe pain in your thighs, legs, or  buttocks.  You have unexplained weight loss. This information is not intended to replace advice given to you by your health care provider. Make sure you discuss any questions you have with your health care provider. Document Released: 05/06/2001 Document Revised: 08/03/2015 Document Reviewed: 08/06/2012 [employer] Interactive Patient Education  2017 [employer]  

## 2016-08-16 ENCOUNTER — Ambulatory Visit: Payer: Medicaid Other | Admitting: Family Medicine

## 2016-08-22 ENCOUNTER — Ambulatory Visit: Payer: Self-pay | Admitting: General Surgery

## 2016-08-29 ENCOUNTER — Emergency Department (HOSPITAL_COMMUNITY)
Admission: EM | Admit: 2016-08-29 | Discharge: 2016-08-29 | Disposition: A | Discharge status: Home / Self Care | Payer: Medicaid Other | Attending: Emergency Medicine | Admitting: Emergency Medicine

## 2016-08-29 DIAGNOSIS — A084 Viral intestinal infection, unspecified: Secondary | ICD-10-CM

## 2016-08-29 DIAGNOSIS — Z7984 Long term (current) use of oral hypoglycemic drugs: Secondary | ICD-10-CM | POA: Insufficient documentation

## 2016-08-29 DIAGNOSIS — E1140 Type 2 diabetes mellitus with diabetic neuropathy, unspecified: Secondary | ICD-10-CM | POA: Diagnosis not present

## 2016-08-29 DIAGNOSIS — Z79899 Other long term (current) drug therapy: Secondary | ICD-10-CM | POA: Insufficient documentation

## 2016-08-29 DIAGNOSIS — I10 Essential (primary) hypertension: Secondary | ICD-10-CM | POA: Diagnosis not present

## 2016-08-29 DIAGNOSIS — R1110 Vomiting, unspecified: Secondary | ICD-10-CM | POA: Diagnosis not present

## 2016-08-29 DIAGNOSIS — R109 Unspecified abdominal pain: Secondary | ICD-10-CM | POA: Diagnosis present

## 2016-08-29 DIAGNOSIS — Z87891 Personal history of nicotine dependence: Secondary | ICD-10-CM | POA: Insufficient documentation

## 2016-08-29 LAB — URINALYSIS, ROUTINE W REFLEX MICROSCOPIC
BILIRUBIN URINE: NEGATIVE
GLUCOSE, UA: NEGATIVE mg/dL
HGB URINE DIPSTICK: NEGATIVE
KETONES UR: NEGATIVE mg/dL
LEUKOCYTES UA: NEGATIVE
Nitrite: NEGATIVE
PH: 5 (ref 5.0–8.0)
PROTEIN: NEGATIVE mg/dL
Specific Gravity, Urine: 1.021 (ref 1.005–1.030)

## 2016-08-29 LAB — CBG MONITORING, ED: Glucose-Capillary: 112 mg/dL — ABNORMAL HIGH (ref 65–99)

## 2016-08-29 LAB — COMPREHENSIVE METABOLIC PANEL
ALT: 10 U/L — AB (ref 17–63)
AST: 20 U/L (ref 15–41)
Albumin: 3.8 g/dL (ref 3.5–5.0)
Alkaline Phosphatase: 122 U/L (ref 38–126)
Anion gap: 6 (ref 5–15)
BUN: 11 mg/dL (ref 6–20)
CHLORIDE: 110 mmol/L (ref 101–111)
CO2: 25 mmol/L (ref 22–32)
CREATININE: 1.04 mg/dL (ref 0.61–1.24)
Calcium: 9.2 mg/dL (ref 8.9–10.3)
GFR calc non Af Amer: >60 mL/min (ref >60)
Glucose, Bld: 123 mg/dL — ABNORMAL HIGH (ref 65–99)
POTASSIUM: 3.5 mmol/L (ref 3.5–5.1)
SODIUM: 141 mmol/L (ref 135–145)
Total Bilirubin: 0.4 mg/dL (ref 0.3–1.2)
Total Protein: 7 g/dL (ref 6.5–8.1)

## 2016-08-29 LAB — CBC
HCT: 44.8 % (ref 39.0–52.0)
Hemoglobin: 14.7 g/dL (ref 13.0–17.0)
MCH: 30.9 pg (ref 26.0–34.0)
MCHC: 32.8 g/dL (ref 30.0–36.0)
MCV: 94.3 fL (ref 78.0–100.0)
PLATELETS: 302 10*3/uL (ref 150–400)
RBC: 4.75 MIL/uL (ref 4.22–5.81)
RDW: 13.6 % (ref 11.5–15.5)
WBC: 8.7 10*3/uL (ref 4.0–10.5)

## 2016-08-29 LAB — LIPASE, BLOOD: Lipase: 41 U/L (ref 11–51)

## 2016-08-29 MED ORDER — LOPERAMIDE HCL 2 MG PO CAPS: 2.0000 mg | ORAL_CAPSULE | Freq: Four times a day (QID) | ORAL | 0 refills | Status: DC | PRN

## 2016-08-29 MED ORDER — ONDANSETRON HCL 4 MG PO TABS: 4.0000 mg | ORAL_TABLET | Freq: Four times a day (QID) | ORAL | 0 refills | Status: DC

## 2016-08-29 MED ORDER — ONDANSETRON HCL 4 MG/2ML IJ SOLN
4.0000 mg | Freq: Once | INTRAMUSCULAR | Status: AC
  Administered 2016-08-29: 4 mg via INTRAVENOUS
  Filled 2016-08-29: qty 2

## 2016-08-29 MED ORDER — SODIUM CHLORIDE 0.9 % IV BOLUS (SEPSIS)
1000.0000 mL | Freq: Once | INTRAVENOUS | Status: AC
  Administered 2016-08-29: 1000 mL via INTRAVENOUS

## 2016-08-29 MED ORDER — IBUPROFEN 600 MG PO TABS: 600.0000 mg | ORAL_TABLET | Freq: Four times a day (QID) | ORAL | 0 refills | Status: DC | PRN

## 2016-08-29 MED ORDER — MORPHINE SULFATE (PF) 4 MG/ML IV SOLN
4.0000 mg | Freq: Once | INTRAVENOUS | Status: AC
  Administered 2016-08-29: 4 mg via INTRAVENOUS
  Filled 2016-08-29: qty 1

## 2016-08-29 NOTE — ED Provider Notes (Signed)
[facility] DEPT Provider Note   CSN: [id number] Arrival date & time: 08/29/16  1013     History   Chief Complaint Chief Complaint  Patient presents with  . Abdominal Pain  . Emesis    HPI David Wyatt is a 56 y.o. male.  HPI  56 y.o. male with a hx of HTN, DM2, presents to the Emergency Department today complaining of abdominal pain around 0800 this AM. Notes emesis x 2 as well as diarrhea. No sick contacts. No expired food intake. Notes generalized abdominal pain with no focal tenderness. No fevers. No CP/SOB. Pt states that he just took his HTN and DM meds all at once yesterday after being off of the medication x 5-6 months. Rates pain 10/10. Aching sensation. No meds PTA. Of note, pt with inguinal hernia in left groin. Denies tenderness to area. Notes having surgery on Friday for this. Has had hernia for "a while." No other symptoms noted.    Past Medical History:  Diagnosis Date  . Angioedema of lips 07/28/2012   left upper (07/29/2012)  . Arthritis    "hands" (07/28/2012)  . Chronic back pain   . Cocaine abuse   . Hypertension   . Neuropathy   . Type II diabetes mellitus [facility])     Patient Active Problem List   Diagnosis Date Noted  . GERD (gastroesophageal reflux disease) 06/13/2015  . Controlled type 2 diabetes mellitus with complication, without long-term current use of insulin ([place]) 05/01/2015  . Diabetic neuropathy ([place]) 05/01/2015  . Essential hypertension, benign 11/01/2013  . Lumbar disc herniation 09/13/2013  . Unspecified constipation 09/13/2013  . Diabetes mellitus due to underlying condition without complications ([place]) 03/19/3233  . Chronic back pain 09/13/2013  . Viral gastroenteritis 05/05/2013  . Hypoglycemia 05/05/2013  . Felon of finger of left hand with lymphangitis 12/13/2012  . Hyperglycemia 07/29/2012  . Angioedema of lips 07/29/2012  . Crack cocaine use 07/29/2012  . Arthritis 07/29/2012  . Weakness generalized 07/29/2012    Past  Surgical History:  Procedure Laterality Date  . BACK SURGERY    . HERNIA REPAIR Right 04/01/2012  . I&D EXTREMITY Left 12/13/2012   Procedure: IRRIGATION AND DEBRIDEMENT LEFT THUMB;  Surgeon: Tennis Must, MD;  Location: [facility];  Service: Orthopedics;  Laterality: Left;  . INCISION AND DRAINAGE OF WOUND     boil on back/notes 07/14/2008 (07/29/2012)  . INGUINAL HERNIA REPAIR  04/01/2012   Procedure: HERNIA REPAIR INGUINAL ADULT;  Surgeon: Haywood Lasso, MD;  Location: [facility];  Service: General;  Laterality: Right;       Home Medications    Prior to Admission medications   Medication Sig Start Date End Date Taking? Authorizing Provider  amLODipine (NORVASC) 2.5 MG tablet Take 1 tablet (2.5 mg total) by mouth daily. 08/13/16   Arnoldo Morale, MD  atorvastatin (LIPITOR) 40 MG tablet Take 1 tablet (40 mg total) by mouth daily. 08/13/16   Arnoldo Morale, MD  gabapentin (NEURONTIN) 300 MG capsule Take 1 capsule (300 mg total) by mouth 3 (three) times daily. 08/13/16   Arnoldo Morale, MD  glipiZIDE (GLUCOTROL) 5 MG tablet Take 0.5 tablets (2.5 mg total) by mouth daily before breakfast. 08/13/16   Arnoldo Morale, MD  metFORMIN (GLUCOPHAGE) 500 MG tablet Take 2 tablets (1,000 mg total) by mouth 2 (two) times daily with a meal. 08/13/16   Arnoldo Morale, MD  naproxen (NAPROSYN) 500 MG tablet Take 1 tablet (500 mg total) by mouth 2 (two) times daily. 05/16/16  Arnoldo Morale, MD  ondansetron (ZOFRAN ODT) 4 MG disintegrating tablet Take 1 tablet (4 mg total) by mouth every 8 (eight) hours as needed for nausea or vomiting. 06/16/16   Esaw Grandchild, MD  tiZANidine (ZANAFLEX) 4 MG tablet Take 1 tablet (4 mg total) by mouth every 8 (eight) hours as needed for muscle spasms. 08/13/16   Arnoldo Morale, MD    Family History Family History  Problem Relation Age of Onset  . Diabetes Father     Social History Social History  Substance Use Topics  . Smoking status: Former Smoker    Packs/day: 0.25    Years: 30.00     Types: Cigarettes    Quit date: 08/15/2015  . Smokeless tobacco: Former Systems developer    Quit date: 08/15/2015     Comment: 4 cigs daily  . Alcohol use No     Allergies   Lisinopril   Review of Systems Review of Systems ROS reviewed and all are negative for acute change except as noted in the HPI.  Physical Exam Updated Vital Signs BP (!) 156/98 (BP Location: Right Arm)   Pulse 89   Temp 98 F (36.7 C) (Oral)   Resp 13   SpO2 91%   Physical Exam  Constitutional: He is oriented to person, place, and time. Vital signs are normal. He appears well-developed and well-nourished.  HENT:  Head: Normocephalic and atraumatic.  Right Ear: Hearing normal.  Left Ear: Hearing normal.  Eyes: Conjunctivae and EOM are normal. Pupils are equal, round, and reactive to light.  Neck: Normal range of motion. Neck supple.  Cardiovascular: Normal rate, regular rhythm, normal heart sounds and intact distal pulses.   Pulmonary/Chest: Effort normal and breath sounds normal.  Abdominal: Soft. Normal appearance and bowel sounds are normal. There is generalized tenderness.  Abdomen soft  Genitourinary:  Genitourinary Comments: Chaperone present. Left groin with inguinal hernia noted. Easily reducible. No discoloration. Non TTP  Musculoskeletal: Normal range of motion.  Neurological: He is alert and oriented to person, place, and time.  Skin: Skin is warm and dry.  Psychiatric: He has a normal mood and affect. His speech is normal and behavior is normal. Thought content normal.  Nursing note and vitals reviewed.  ED Treatments / Results  Labs (all labs ordered are listed, but only abnormal results are displayed) Labs Reviewed  COMPREHENSIVE METABOLIC PANEL - Abnormal; Notable for the following:       Result Value   Glucose, Bld 123 (*)    ALT 10 (*)    All other components within normal limits  URINALYSIS, ROUTINE W REFLEX MICROSCOPIC - Abnormal; Notable for the following:    APPearance HAZY (*)    All  other components within normal limits  CBG MONITORING, ED - Abnormal; Notable for the following:    Glucose-Capillary 112 (*)    All other components within normal limits  CBC  LIPASE, BLOOD    EKG  EKG Interpretation None       Radiology No results found.  Procedures Procedures (including critical care time)  Medications Ordered in ED Medications  sodium chloride 0.9 % bolus 1,000 mL (1,000 mLs Intravenous New Bag/Given 08/29/16 1041)  ondansetron (ZOFRAN) injection 4 mg (4 mg Intravenous Given 08/29/16 1042)  morphine 4 MG/ML injection 4 mg (4 mg Intravenous Given 08/29/16 1042)     Initial Impression / Assessment and Plan / ED Course  I have reviewed the triage vital signs and the nursing notes.  Pertinent labs & imaging  results that were available during my care of the patient were reviewed by me and considered in my medical decision making (see chart for details).  Final Clinical Impressions(s) / ED Diagnoses  {I have reviewed and evaluated the relevant laboratory values.  {I have reviewed the relevant previous healthcare records.  {I obtained HPI from historian.   ED Course:  Assessment: Patient is a 56 y.o. male hx DM, HTN presents with abdominal pain x this AM. Notes N/V/D. Generalized abdominal pain. No fevers. No CP/SOB. On exam, nontoxic, nonseptic appearing, in no apparent distress. Patient's pain and other symptoms adequately managed in emergency department.  Fluid bolus given.  Labs and vitals reviewed. No evidence of DKA, Glucose 123. Gap 6.  Patient does not meet the SIRS or Sepsis criteria.  On repeat exam patient does not have a surgical abdomen and there are no peritoneal signs.  No indication of appendicitis, bowel obstruction, bowel perforation, cholecystitis, diverticulitis. Pt with existing inguinal hernia on left and scheduled for surgery on Friday. Area is non TTP. No discoloration noted. Easily reducible. Doubt incarcerated hernia given exam. Likely viral  gastroenteritis. Patient discharged home with symptomatic treatment and given strict instructions for follow-up with their primary care physician.  I have also discussed reasons to return immediately to the ER.  Patient expresses understanding and agrees with plan.  Disposition/Plan:  DC Home Additional Verbal discharge instructions given and discussed with patient.  Pt Instructed to f/u with PCP in the next week for evaluation and treatment of symptoms. Return precautions given Pt acknowledges and agrees with plan  Supervising Physician Orlie Dakin, MD  Final diagnoses:  Viral gastroenteritis    New Prescriptions New Prescriptions   No medications on file       Shary Decamp, Hershal Coria 08/29/16 [place], MD 08/29/16 1529

## 2016-08-29 NOTE — ED Triage Notes (Signed)
Pt arrives from home reporting new onset abd pain around 0800 today, reports emesis x2.  Pt reports not having access to meds for past several months, reports restarting meds yesterday.  Pt reports needing to have BM urgently on arrival, taken to restroom.  Resp e/u, NAD noted at this time.

## 2016-08-29 NOTE — Discharge Instructions (Signed)
Please read and follow all provided instructions.  Your diagnoses today include:  1. Viral gastroenteritis     Tests performed today include: Blood counts and electrolytes Blood tests to check liver and kidney function Blood tests to check pancreas function Urine test to look for infection and pregnancy (in women) Vital signs. See below for your results today.   Medications prescribed:   Take any prescribed medications only as directed.  Home care instructions:  Follow any educational materials contained in this packet.  Follow-up instructions: Please follow-up with your primary care provider in the next 2 days for further evaluation of your symptoms.    Return instructions:  SEEK IMMEDIATE MEDICAL ATTENTION IF: The pain does not go away or becomes severe  A temperature above 101F develops  Repeated vomiting occurs (multiple episodes)  The pain becomes localized to portions of the abdomen. The right side could possibly be appendicitis. In an adult, the left lower portion of the abdomen could be colitis or diverticulitis.  Blood is being passed in stools or vomit (bright red or black tarry stools)  You develop chest pain, difficulty breathing, dizziness or fainting, or become confused, poorly responsive, or inconsolable (young children) If you have any other emergent concerns regarding your health  Additional Information: Abdominal (belly) pain can be caused by many things. Your caregiver performed an examination and possibly ordered blood/urine tests and imaging (CT scan, x-rays, ultrasound). Many cases can be observed and treated at home after initial evaluation in the emergency department. Even though you are being discharged home, abdominal pain can be unpredictable. Therefore, you need a repeated exam if your pain does not resolve, returns, or worsens. Most patients with abdominal pain don't have to be admitted to the hospital or have surgery, but serious problems like  appendicitis and gallbladder attacks can start out as nonspecific pain. Many abdominal conditions cannot be diagnosed in one visit, so follow-up evaluations are very important.  Your vital signs today were: BP (!) 156/98 (BP Location: Right Arm)    Pulse 89    Temp 98 F (36.7 C) (Oral)    Resp 13    SpO2 91%  If your blood pressure (bp) was elevated above 135/85 this visit, please have this repeated by your doctor within one month. --------------

## 2016-09-03 NOTE — Pre-Procedure Instructions (Signed)
[name]  09/03/2016      [facility], [address] [place] [address] Phone: [phone] Fax: [phone]    Your procedure is scheduled on June 29  Report to [facility] at [place].  Call this number if you have problems the morning of surgery:  [phone]   Remember:  Do not eat food or drink liquids after midnight.   Take these medicines the morning of surgery with A SIP OF WATER amLODipine (NORVASC),   ondansetron (ZOFRAN ODT), tiZANidine (ZANAFLEX)    7 days prior to surgery STOP taking any Aspirin, Aleve, Naproxen, Ibuprofen, Motrin, Advil, Goody's, BC's, all herbal medications, fish oil, and all vitamins   WHAT DO I DO ABOUT MY DIABETES MEDICATION?   Marland Kitchen Do not take oral diabetes medicines (pills) the morning of surgery. glipiZIDE (GLUCOTROL) and  metFORMIN (GLUCOPHAGE)    How to Manage Your Diabetes Before and After Surgery  Why is it important to control my blood sugar before and after surgery? . Improving blood sugar levels before and after surgery helps healing and can limit problems. . A way of improving blood sugar control is eating a healthy diet by: o  Eating less sugar and carbohydrates o  Increasing activity/exercise o  Talking with your doctor about reaching your blood sugar goals . High blood sugars (greater than 180 mg/dL) can raise your risk of infections and slow your recovery, so you will need to focus on controlling your diabetes during the weeks before surgery. . Make sure that the doctor who takes care of your diabetes knows about your planned surgery including the date and location.  How do I manage my blood sugar before surgery? . Check your blood sugar at least 4 times a day, starting 2 days before surgery, to make sure that the level is not too high or low. o Check your blood sugar the morning of your surgery when you wake up and every 2 hours  until you get to the Short Stay unit. . If your blood sugar is less than 70 mg/dL, you will need to treat for low blood sugar: o Do not take insulin. o Treat a low blood sugar (less than 70 mg/dL) with  cup of clear juice (cranberry or apple), 4 glucose tablets, OR glucose gel. o Recheck blood sugar in 15 minutes after treatment (to make sure it is greater than 70 mg/dL). If your blood sugar is not greater than 70 mg/dL on recheck, call [phone] for further instructions. . Report your blood sugar to the short stay nurse when you get to Short Stay.  . If you are admitted to the hospital after surgery: o Your blood sugar will be checked by the staff and you will probably be given insulin after surgery (instead of oral diabetes medicines) to make sure you have good blood sugar levels. o The goal for blood sugar control after surgery is 80-180 mg/dL.    Do not wear jewelry  Do not wear lotions, powders, or cologne, or deoderant.  Men may shave face and neck.  Do not bring valuables to the hospital.  [facility] is not responsible for any belongings or valuables.  Contacts, dentures or bridgework may not be worn into surgery.  Leave your suitcase in the car.  After surgery it may be brought to your room.  For patients admitted to the hospital, discharge time will be determined by  your treatment team.  Patients discharged the day of surgery will not be allowed to drive home.    Special instructions:   [facility]- Preparing For Surgery  Before surgery, you can play an important role. Because skin is not sterile, your skin needs to be as free of germs as possible. You can reduce the number of germs on your skin by washing with CHG (chlorahexidine gluconate) Soap before surgery.  CHG is an antiseptic cleaner which kills germs and bonds with the skin to continue killing germs even after washing.  Please do not use if you have an allergy to CHG or antibacterial soaps. If your skin becomes  reddened/irritated stop using the CHG.  Do not shave (including legs and underarms) for at least 48 hours prior to first CHG shower. It is OK to shave your face.  Please follow these instructions carefully.   1. Shower the NIGHT BEFORE SURGERY and the MORNING OF SURGERY with CHG.   2. If you chose to wash your hair, wash your hair first as usual with your normal shampoo.  3. After you shampoo, rinse your hair and body thoroughly to remove the shampoo.  4. Use CHG as you would any other liquid soap. You can apply CHG directly to the skin and wash gently with a scrungie or a clean washcloth.   5. Apply the CHG Soap to your body ONLY FROM THE NECK DOWN.  Do not use on open wounds or open sores. Avoid contact with your eyes, ears, mouth and genitals (private parts). Wash genitals (private parts) with your normal soap.  6. Wash thoroughly, paying special attention to the area where your surgery will be performed.  7. Thoroughly rinse your body with warm water from the neck down.  8. DO NOT shower/wash with your normal soap after using and rinsing off the CHG Soap.  9. Pat yourself dry with a CLEAN TOWEL.   10. Wear CLEAN PAJAMAS   11. Place CLEAN SHEETS on your bed the night of your first shower and DO NOT SLEEP WITH PETS.    Day of Surgery: Do not apply any deodorants/lotions. Please wear clean clothes to the hospital/surgery center.      Please read over the following fact sheets that you were given.

## 2016-09-04 ENCOUNTER — Encounter (HOSPITAL_COMMUNITY)
Admission: RE | Admit: 2016-09-04 | Discharge: 2016-09-04 | Disposition: A | Discharge status: Home / Self Care | Payer: Medicaid Other | Source: Ambulatory Visit | Attending: General Surgery | Admitting: General Surgery

## 2016-09-04 ENCOUNTER — Encounter (HOSPITAL_COMMUNITY): Payer: Self-pay

## 2016-09-04 DIAGNOSIS — Z01818 Encounter for other preprocedural examination: Secondary | ICD-10-CM | POA: Diagnosis present

## 2016-09-04 HISTORY — DX: Type 2 diabetes mellitus with diabetic neuropathy, unspecified: E11.40

## 2016-09-04 HISTORY — DX: Pure hypercholesterolemia, unspecified: E78.00

## 2016-09-04 LAB — RAPID URINE DRUG SCREEN, HOSP PERFORMED
Amphetamines: NOT DETECTED
Barbiturates: NOT DETECTED
Benzodiazepines: NOT DETECTED
COCAINE: NOT DETECTED
OPIATES: NOT DETECTED
Tetrahydrocannabinol: NOT DETECTED

## 2016-09-04 LAB — GLUCOSE, CAPILLARY: Glucose-Capillary: 198 mg/dL — ABNORMAL HIGH (ref 65–99)

## 2016-09-04 NOTE — Progress Notes (Signed)
PCP - Arnoldo Morale Cardiologist - denies  Chest x-ray - 09/17/15 EKG - 09/17/15 Stress Test - denies ECHO - denies Cardiac Cath - denes    Fasting Blood Sugar - 120s Checks Blood Sugar __2___ times a day  Sending to anesthesia for review Patient's last use of Cocaine was 08/23/16 - patient stated that he has not used since then Patient ws instructed to make sure that he does not use before surgery Patient was also instructed to not smoke before surgery as well Patient verbalized understanding   Patient denies shortness of breath, fever, cough and chest pain at PAT appointment   Patient verbalized understanding of instructions that were given to them at the PAT appointment. Patient was also instructed that they will need to review over the PAT instructions again at home before surgery.

## 2016-09-05 MED ORDER — ACETAMINOPHEN 500 MG PO TABS
1000.0000 mg | ORAL_TABLET | ORAL | Status: AC
  Administered 2016-09-06: 1000 mg via ORAL
  Filled 2016-09-05: qty 2

## 2016-09-05 MED ORDER — GABAPENTIN 300 MG PO CAPS
300.0000 mg | ORAL_CAPSULE | ORAL | Status: AC
  Administered 2016-09-06: 300 mg via ORAL
  Filled 2016-09-05: qty 1

## 2016-09-05 MED ORDER — CEFAZOLIN SODIUM-DEXTROSE 2-4 GM/100ML-% IV SOLN
2.0000 g | INTRAVENOUS | Status: AC
  Filled 2016-09-05: qty 100

## 2016-09-06 ENCOUNTER — Ambulatory Visit (HOSPITAL_COMMUNITY): Payer: Medicaid Other | Admitting: Emergency Medicine

## 2016-09-06 ENCOUNTER — Encounter (HOSPITAL_COMMUNITY): Payer: Self-pay | Admitting: *Deleted

## 2016-09-06 ENCOUNTER — Ambulatory Visit (HOSPITAL_COMMUNITY)
Admission: RE | Admit: 2016-09-06 | Discharge: 2016-09-06 | Disposition: A | Discharge status: Home / Self Care | Payer: Medicaid Other | Source: Ambulatory Visit | Attending: General Surgery | Admitting: General Surgery

## 2016-09-06 ENCOUNTER — Ambulatory Visit (HOSPITAL_COMMUNITY): Payer: Medicaid Other | Admitting: Certified Registered"

## 2016-09-06 ENCOUNTER — Encounter (HOSPITAL_COMMUNITY)
Admission: RE | Disposition: A | Discharge status: Home / Self Care | Payer: Self-pay | Source: Ambulatory Visit | Attending: General Surgery

## 2016-09-06 DIAGNOSIS — F1419 Cocaine abuse with unspecified cocaine-induced disorder: Secondary | ICD-10-CM | POA: Diagnosis not present

## 2016-09-06 DIAGNOSIS — E7800 Pure hypercholesterolemia, unspecified: Secondary | ICD-10-CM | POA: Insufficient documentation

## 2016-09-06 DIAGNOSIS — M1990 Unspecified osteoarthritis, unspecified site: Secondary | ICD-10-CM | POA: Diagnosis not present

## 2016-09-06 DIAGNOSIS — I10 Essential (primary) hypertension: Secondary | ICD-10-CM | POA: Insufficient documentation

## 2016-09-06 DIAGNOSIS — Z79899 Other long term (current) drug therapy: Secondary | ICD-10-CM | POA: Insufficient documentation

## 2016-09-06 DIAGNOSIS — E119 Type 2 diabetes mellitus without complications: Secondary | ICD-10-CM | POA: Diagnosis not present

## 2016-09-06 DIAGNOSIS — K219 Gastro-esophageal reflux disease without esophagitis: Secondary | ICD-10-CM | POA: Insufficient documentation

## 2016-09-06 DIAGNOSIS — Z7984 Long term (current) use of oral hypoglycemic drugs: Secondary | ICD-10-CM | POA: Diagnosis not present

## 2016-09-06 DIAGNOSIS — K4090 Unilateral inguinal hernia, without obstruction or gangrene, not specified as recurrent: Secondary | ICD-10-CM | POA: Diagnosis present

## 2016-09-06 DIAGNOSIS — F17210 Nicotine dependence, cigarettes, uncomplicated: Secondary | ICD-10-CM | POA: Insufficient documentation

## 2016-09-06 DIAGNOSIS — Z590 Homelessness: Secondary | ICD-10-CM | POA: Diagnosis not present

## 2016-09-06 HISTORY — PX: INSERTION OF MESH: SHX5868

## 2016-09-06 HISTORY — PX: INGUINAL HERNIA REPAIR: SHX194

## 2016-09-06 LAB — GLUCOSE, CAPILLARY
Glucose-Capillary: 127 mg/dL — ABNORMAL HIGH (ref 65–99)
Glucose-Capillary: 111 mg/dL — ABNORMAL HIGH (ref 65–99)

## 2016-09-06 SURGERY — REPAIR, HERNIA, INGUINAL, ADULT
Anesthesia: General | Site: Groin | Laterality: Left

## 2016-09-06 MED ORDER — SUGAMMADEX SODIUM 200 MG/2ML IV SOLN
INTRAVENOUS | Status: AC
  Filled 2016-09-06: qty 2

## 2016-09-06 MED ORDER — LACTATED RINGERS IV SOLN
INTRAVENOUS | Status: DC | PRN
  Administered 2016-09-06 (×2): via INTRAVENOUS

## 2016-09-06 MED ORDER — ROCURONIUM BROMIDE 10 MG/ML (PF) SYRINGE
PREFILLED_SYRINGE | INTRAVENOUS | Status: AC
  Filled 2016-09-06: qty 5

## 2016-09-06 MED ORDER — OXYCODONE HCL 5 MG PO TABS: 5.0000 mg | ORAL_TABLET | ORAL | Status: DC | PRN

## 2016-09-06 MED ORDER — ONDANSETRON HCL 4 MG/2ML IJ SOLN
INTRAMUSCULAR | Status: DC | PRN
  Administered 2016-09-06: 4 mg via INTRAVENOUS

## 2016-09-06 MED ORDER — CEFAZOLIN SODIUM-DEXTROSE 2-3 GM-% IV SOLR
INTRAVENOUS | Status: DC | PRN
  Administered 2016-09-06: 2 g via INTRAVENOUS

## 2016-09-06 MED ORDER — LIDOCAINE 2% (20 MG/ML) 5 ML SYRINGE
INTRAMUSCULAR | Status: AC
  Filled 2016-09-06: qty 5

## 2016-09-06 MED ORDER — FENTANYL CITRATE (PF) 100 MCG/2ML IJ SOLN
INTRAMUSCULAR | Status: DC | PRN
  Administered 2016-09-06: 50 ug via INTRAVENOUS

## 2016-09-06 MED ORDER — KETOROLAC TROMETHAMINE 30 MG/ML IJ SOLN
INTRAMUSCULAR | Status: AC
Start: 2016-09-06 — End: 2016-09-06
  Filled 2016-09-06: qty 1

## 2016-09-06 MED ORDER — MIDAZOLAM HCL 5 MG/5ML IJ SOLN
INTRAMUSCULAR | Status: DC | PRN
  Administered 2016-09-06: 2 mg via INTRAVENOUS

## 2016-09-06 MED ORDER — FENTANYL CITRATE (PF) 250 MCG/5ML IJ SOLN
INTRAMUSCULAR | Status: AC
  Filled 2016-09-06: qty 5

## 2016-09-06 MED ORDER — CHLORHEXIDINE GLUCONATE CLOTH 2 % EX PADS: 6.0000 | MEDICATED_PAD | Freq: Once | CUTANEOUS | Status: DC

## 2016-09-06 MED ORDER — FENTANYL CITRATE (PF) 100 MCG/2ML IJ SOLN
INTRAMUSCULAR | Status: AC
  Filled 2016-09-06: qty 2

## 2016-09-06 MED ORDER — PROPOFOL 10 MG/ML IV BOLUS
INTRAVENOUS | Status: DC | PRN
  Administered 2016-09-06: 160 mg via INTRAVENOUS

## 2016-09-06 MED ORDER — OXYCODONE HCL 5 MG PO TABS: 5.0000 mg | ORAL_TABLET | Freq: Four times a day (QID) | ORAL | 0 refills | Status: DC | PRN

## 2016-09-06 MED ORDER — DEXAMETHASONE SODIUM PHOSPHATE 10 MG/ML IJ SOLN
INTRAMUSCULAR | Status: DC | PRN
  Administered 2016-09-06: 5 mg via INTRAVENOUS

## 2016-09-06 MED ORDER — KETOROLAC TROMETHAMINE 30 MG/ML IJ SOLN
INTRAMUSCULAR | Status: DC | PRN
  Administered 2016-09-06: 30 mg via INTRAVENOUS

## 2016-09-06 MED ORDER — BUPIVACAINE-EPINEPHRINE (PF) 0.25% -1:200000 IJ SOLN
INTRAMUSCULAR | Status: AC
  Filled 2016-09-06: qty 30

## 2016-09-06 MED ORDER — BUPIVACAINE-EPINEPHRINE (PF) 0.5% -1:200000 IJ SOLN
INTRAMUSCULAR | Status: DC | PRN
  Administered 2016-09-06: 30 mL via PERINEURAL

## 2016-09-06 MED ORDER — BUPIVACAINE-EPINEPHRINE 0.25% -1:200000 IJ SOLN
INTRAMUSCULAR | Status: DC | PRN
  Administered 2016-09-06: 3 mL

## 2016-09-06 MED ORDER — SUGAMMADEX SODIUM 200 MG/2ML IV SOLN
INTRAVENOUS | Status: DC | PRN
  Administered 2016-09-06: 200 mg via INTRAVENOUS

## 2016-09-06 MED ORDER — MIDAZOLAM HCL 2 MG/2ML IJ SOLN
INTRAMUSCULAR | Status: AC
  Filled 2016-09-06: qty 2

## 2016-09-06 MED ORDER — PROMETHAZINE HCL 25 MG/ML IJ SOLN: 6.2500 mg | INTRAMUSCULAR | Status: DC | PRN

## 2016-09-06 MED ORDER — PHENYLEPHRINE 40 MCG/ML (10ML) SYRINGE FOR IV PUSH (FOR BLOOD PRESSURE SUPPORT)
PREFILLED_SYRINGE | INTRAVENOUS | Status: AC
  Filled 2016-09-06: qty 20

## 2016-09-06 MED ORDER — PHENYLEPHRINE HCL 10 MG/ML IJ SOLN
INTRAMUSCULAR | Status: DC | PRN
  Administered 2016-09-06 (×3): 80 ug via INTRAVENOUS
  Administered 2016-09-06: 40 ug via INTRAVENOUS
  Administered 2016-09-06 (×6): 80 ug via INTRAVENOUS

## 2016-09-06 MED ORDER — ONDANSETRON HCL 4 MG/2ML IJ SOLN
INTRAMUSCULAR | Status: AC
  Filled 2016-09-06: qty 2

## 2016-09-06 MED ORDER — PROPOFOL 10 MG/ML IV BOLUS
INTRAVENOUS | Status: AC
  Filled 2016-09-06: qty 40

## 2016-09-06 MED ORDER — ROCURONIUM BROMIDE 100 MG/10ML IV SOLN
INTRAVENOUS | Status: DC | PRN
  Administered 2016-09-06: 50 mg via INTRAVENOUS
  Administered 2016-09-06: 20 mg via INTRAVENOUS
  Administered 2016-09-06: 10 mg via INTRAVENOUS

## 2016-09-06 MED ORDER — DEXAMETHASONE SODIUM PHOSPHATE 10 MG/ML IJ SOLN
INTRAMUSCULAR | Status: AC
  Filled 2016-09-06: qty 1

## 2016-09-06 MED ORDER — LIDOCAINE HCL (CARDIAC) 20 MG/ML IV SOLN
INTRAVENOUS | Status: DC | PRN
  Administered 2016-09-06: 80 mg via INTRAVENOUS

## 2016-09-06 MED ORDER — 0.9 % SODIUM CHLORIDE (POUR BTL) OPTIME
TOPICAL | Status: DC | PRN
  Administered 2016-09-06: 1000 mL

## 2016-09-06 MED ORDER — FENTANYL CITRATE (PF) 100 MCG/2ML IJ SOLN
25.0000 ug | INTRAMUSCULAR | Status: DC | PRN
  Administered 2016-09-06 (×2): 25 ug via INTRAVENOUS

## 2016-09-06 MED ORDER — SUCCINYLCHOLINE CHLORIDE 200 MG/10ML IV SOSY
PREFILLED_SYRINGE | INTRAVENOUS | Status: AC
  Filled 2016-09-06: qty 10

## 2016-09-06 SURGICAL SUPPLY — 57 items
ADH SKN CLS APL DERMABOND .7 (GAUZE/BANDAGES/DRESSINGS) ×1
APL SKNCLS STERI-STRIP NONHPOA (GAUZE/BANDAGES/DRESSINGS) ×1
BENZOIN TINCTURE PRP APPL 2/3 (GAUZE/BANDAGES/DRESSINGS) ×3 IMPLANT
BLADE CLIPPER SURG (BLADE) ×2 IMPLANT
BLADE SURG 10 STRL SS (BLADE) ×3 IMPLANT
BLADE SURG 15 STRL LF DISP TIS (BLADE) ×1 IMPLANT
BLADE SURG 15 STRL SS (BLADE) ×3
CANISTER SUCT 3000ML PPV (MISCELLANEOUS) ×2 IMPLANT
CHLORAPREP W/TINT 26ML (MISCELLANEOUS) ×3 IMPLANT
CLOSURE WOUND 1/2 X4 (GAUZE/BANDAGES/DRESSINGS) ×1
COVER SURGICAL LIGHT HANDLE (MISCELLANEOUS) ×3 IMPLANT
DERMABOND ADVANCED (GAUZE/BANDAGES/DRESSINGS) ×2
DERMABOND ADVANCED .7 DNX12 (GAUZE/BANDAGES/DRESSINGS) IMPLANT
DRAIN PENROSE 1/2X12 LTX STRL (WOUND CARE) ×2 IMPLANT
DRAPE LAPAROTOMY 100X72 PEDS (DRAPES) ×3 IMPLANT
DRAPE LAPAROTOMY TRNSV 102X78 (DRAPE) ×2 IMPLANT
DRAPE UTILITY XL STRL (DRAPES) ×6 IMPLANT
DRSG TEGADERM 4X4.75 (GAUZE/BANDAGES/DRESSINGS) ×3 IMPLANT
ELECT CAUTERY BLADE 6.4 (BLADE) ×3 IMPLANT
ELECT REM PT RETURN 9FT ADLT (ELECTROSURGICAL) ×3
ELECTRODE REM PT RTRN 9FT ADLT (ELECTROSURGICAL) ×1 IMPLANT
GAUZE SPONGE 4X4 12PLY STRL (GAUZE/BANDAGES/DRESSINGS) ×3 IMPLANT
GLOVE BIOGEL M STRL SZ7.5 (GLOVE) ×5 IMPLANT
GLOVE BIOGEL PI IND STRL 8 (GLOVE) ×2 IMPLANT
GLOVE BIOGEL PI INDICATOR 8 (GLOVE) ×4
GOWN STRL REUS W/ TWL LRG LVL3 (GOWN DISPOSABLE) ×1 IMPLANT
GOWN STRL REUS W/ TWL XL LVL3 (GOWN DISPOSABLE) IMPLANT
GOWN STRL REUS W/TWL 2XL LVL3 (GOWN DISPOSABLE) ×3 IMPLANT
GOWN STRL REUS W/TWL LRG LVL3 (GOWN DISPOSABLE) ×6
GOWN STRL REUS W/TWL XL LVL3 (GOWN DISPOSABLE) ×3
KIT BASIN OR (CUSTOM PROCEDURE TRAY) ×3 IMPLANT
KIT ROOM TURNOVER OR (KITS) ×3 IMPLANT
MESH ULTRAPRO 3X6 7.6X15CM (Mesh General) ×2 IMPLANT
NDL HYPO 25GX1X1/2 BEV (NEEDLE) ×1 IMPLANT
NEEDLE HYPO 25GX1X1/2 BEV (NEEDLE) ×3 IMPLANT
NS IRRIG 1000ML POUR BTL (IV SOLUTION) ×3 IMPLANT
PACK SURGICAL SETUP 50X90 (CUSTOM PROCEDURE TRAY) ×3 IMPLANT
PAD ARMBOARD 7.5X6 YLW CONV (MISCELLANEOUS) ×3 IMPLANT
PENCIL BUTTON HOLSTER BLD 10FT (ELECTRODE) ×5 IMPLANT
SPECIMEN JAR SMALL (MISCELLANEOUS) IMPLANT
SPONGE INTESTINAL PEANUT (DISPOSABLE) IMPLANT
SPONGE LAP 18X18 X RAY DECT (DISPOSABLE) ×3 IMPLANT
STRIP CLOSURE SKIN 1/2X4 (GAUZE/BANDAGES/DRESSINGS) ×2 IMPLANT
SUT MNCRL AB 4-0 PS2 18 (SUTURE) ×3 IMPLANT
SUT PROLENE 2 0 CT2 30 (SUTURE) ×6 IMPLANT
SUT VIC AB 2-0 CT1 36 (SUTURE) IMPLANT
SUT VIC AB 2-0 SH 27 (SUTURE) ×3
SUT VIC AB 2-0 SH 27XBRD (SUTURE) ×1 IMPLANT
SUT VIC AB 3-0 SH 18 (SUTURE) ×3 IMPLANT
SUT VICRYL AB 3 0 TIES (SUTURE) ×3 IMPLANT
SYR BULB 3OZ (MISCELLANEOUS) ×3 IMPLANT
SYR CONTROL 10ML LL (SYRINGE) ×3 IMPLANT
TOWEL OR 17X24 6PK STRL BLUE (TOWEL DISPOSABLE) ×2 IMPLANT
TOWEL OR 17X26 10 PK STRL BLUE (TOWEL DISPOSABLE) ×1 IMPLANT
TUBE CONNECTING 12'X1/4 (SUCTIONS)
TUBE CONNECTING 12X1/4 (SUCTIONS) IMPLANT
YANKAUER SUCT BULB TIP NO VENT (SUCTIONS) IMPLANT

## 2016-09-06 NOTE — Anesthesia Procedure Notes (Signed)
Procedure Name: Intubation Date/Time: 09/06/2016 7:42 AM Performed by: Freddie Breech Pre-anesthesia Checklist: Patient identified, Emergency Drugs available, Suction available and Patient being monitored Patient Re-evaluated:Patient Re-evaluated prior to inductionOxygen Delivery Method: Circle System Utilized Preoxygenation: Pre-oxygenation with 100% oxygen Intubation Type: IV induction Ventilation: Mask ventilation without difficulty and Oral airway inserted - appropriate to patient size Laryngoscope Size: Mac and 4 Grade View: Grade I Tube type: Oral Tube size: 7.5 mm Number of attempts: 1 Airway Equipment and Method: Stylet and Oral airway Placement Confirmation: ETT inserted through vocal cords under direct vision,  positive ETCO2 and breath sounds checked- equal and bilateral Secured at: 23 cm Tube secured with: Tape Dental Injury: Teeth and Oropharynx as per pre-operative assessment

## 2016-09-06 NOTE — Interval H&P Note (Signed)
History and Physical Interval Note:  09/06/2016 7:23 AM  David Wyatt  has presented today for surgery, with the diagnosis of left inguinal hernia  The various methods of treatment have been discussed with the patient and family. After consideration of risks, benefits and other options for treatment, the patient has consented to  Procedure(s): OPEN REPAIR LEFT INGUINAL HERNIA (Left) INSERTION OF MESH (Left) as a surgical intervention .  The patient's history has been reviewed, patient examined, no change in status, stable for surgery.  I have reviewed the patient's chart and labs.  Questions were answered to the patient's satisfaction.    Leighton Ruff. Redmond Pulling, MD, [place], Bariatric, & Minimally Invasive Surgery [facility], [place]  [facility]

## 2016-09-06 NOTE — Addendum Note (Signed)
Addendum  created 09/06/16 1114 by Freddie Breech, CRNA   Charge Capture section accepted

## 2016-09-06 NOTE — OR Nursing (Signed)
Spoke w/ pt's brother over phone. He confirms that per David Wyatt, arrangement have been made for somewhere to stay and for someone to be with him for 24 hrs.

## 2016-09-06 NOTE — Transfer of Care (Signed)
Immediate Anesthesia Transfer of Care Note  Patient: Hobert Poplaski  Procedure(s) Performed: Procedure(s): OPEN REPAIR LEFT INGUINAL HERNIA (Left) INSERTION OF MESH (Left)  Patient Location: PACU  Anesthesia Type:General  Level of Consciousness: drowsy and patient cooperative  Airway & Oxygen Therapy: Patient Spontanous Breathing and Patient connected to face mask oxygen  Post-op Assessment: Report given to RN and Post -op Vital signs reviewed and stable  Post vital signs: Reviewed and stable  Last Vitals:  Vitals:   09/06/16 0625  BP: (!) 150/97  Pulse: 66  Resp: 18  Temp: 36.6 C    Last Pain:  Vitals:   09/06/16 0625  TempSrc: Oral      Patients Stated Pain Goal: 3 (30/94/07 6808)  Complications: No apparent anesthesia complications

## 2016-09-06 NOTE — Op Note (Signed)
David Wyatt [id number] 1961/01/21 09/06/2016  Open Left Indirect Inguinal Hernia Repair with Mesh Procedure Note  Indications: The patient presented with a history of a left, reducible hernia.    Pre-operative Diagnosis: left reducible inguinal hernia  Post-operative Diagnosis: left indirect inguinal hernia  Surgeon: Gayland Curry   Assistants: none  Anesthesia: General endotracheal anesthesia and TAP block  EBL: minimal  Surgeon: Leighton Ruff. Redmond Pulling, MD, FACS  Procedure Details  The patient was seen again in the Holding Room. The risks, benefits, complications, treatment options, and expected outcomes were discussed with the patient. The possibilities of reaction to medication, pulmonary aspiration, perforation of viscus, bleeding, recurrent infection, the need for additional procedures, and development of a complication requiring transfusion or further operation were discussed with the patient and/or family. The likelihood of success in repairing the hernia and returning the patient to their previous functional status is good.  There was concurrence with the proposed plan, and informed consent was obtained. The site of surgery was properly noted/marked. The patient was taken to the Operating Room, identified as David Wyatt, and the procedure verified as left inguinal hernia repair. A Time Out was held and the above information confirmed.  The patient was placed in the supine position and underwent induction of anesthesia. The lower abdomen and groin was prepped with Chloraprep and draped in the standard fashion, and 0.25% Marcaine with epinephrine was used to anesthetize the skin over the mid-portion of the inguinal canal. An oblique incision was made. Dissection was carried down through the subcutaneous tissue with cautery to the external oblique fascia.  We opened the external oblique fascia along the direction of its fibers to the external ring.  The spermatic cord was circumferentially  dissected bluntly and retracted with a Penrose drain.  The floor of the inguinal canal was inspected and there was no direct defect.  We skeletonized the spermatic cord and isolated a small indirect hernia. He had a large cord lipoma which was separated from the other cord structures and ligated and discarded.  We used a 3 x 6 inch piece of Ultrapro mesh, which was cut into a keyhole shape.  This was secured with 2-0 Prolene, beginning at the pubic tubercle, running this along the shelving edge inferiorly. Superiorly, the mesh was secured to the internal oblique fascia with interrupted 2-0 Prolene sutures.  The tails of the mesh were sutured together behind the spermatic cord.  The mesh was tucked underneath the external oblique fascia laterally.  The external oblique fascia was reapproximated with 2-0 Vicryl.  3-0 Vicryl was used to close the subcutaneous tissues and 4-0 Monocryl was used to close the skin in subcuticular fashion.  Dermabond was used to seal the incision.    The patient was then extubated and brought to the recovery room in stable condition.  All sponge, instrument, and needle counts were correct prior to closure and at the conclusion of the case.   Estimated Blood Loss: Minimal                 Complications: None; patient tolerated the procedure well.         Disposition: PACU - hemodynamically stable.         Condition: stable  Leighton Ruff. Redmond Pulling, MD, FACS General, Bariatric, & Minimally Invasive Surgery [facility], [place]

## 2016-09-06 NOTE — H&P (Signed)
David Wyatt 08/22/2016 3:31 PM Location: [facility] Patient #: [id number] DOB: 1960-05-08 Separated / Language: David Wyatt / Race: Black or African American Male   History of Present Illness David Hiss M. Djuna Frechette MD; 08/22/2016 4:27 PM) The patient is a 56 year old male who presents with an inguinal hernia. He is referred by Dr Jarold Song for evaluation of left inguinal hernia. He noticed it a few weeks ago. It causes him discomfort at certain times. He is able to reduce it. However at times it'll be uncomfortable after standing for prolonged period of time. He reports prior right inguinal hernia repair a few years ago as well as an umbilical hernia. He smokes maybe one cigarette a day. He occasionally uses cocaine if he can afford or obtain some. He denies any chest pain, chest pressure, source of breath, TIAs or amaurosis fugax. He denies any dysuria. He has diabetes. He states that currently he is homeless but is taking his medication. He underwent an open right inguinal hernia repair with mesh with Dr. Margot Chimes a few years ago   Problem List/Past Medical David Hiss M. 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 Atorvastatin Calcium (40MG  Tablet, Oral) Active. Gabapentin (300MG  Capsule, Oral) Active. GlipiZIDE (5MG  Tablet, Oral) Active. MetFORMIN HCl (500MG  Tablet, Oral) Active. Naproxen (500MG  Tablet, Oral) Active. Ondansetron (4MG  Tablet Disint, Oral) Active. TiZANidine HCl (4MG  Tablet, Oral) Active. Medications Reconciled  Social History  Malachy Moan, [place]; 08/22/2016 3:31 PM) Caffeine use  Carbonated beverages. Illicit drug use  Uses socially only. No alcohol use  Tobacco use  Current every day smoker.  Family History Malachy Moan, [place]; 08/22/2016 3:31 PM) Alcohol Abuse  Father. Cerebrovascular Accident  Father. Diabetes Mellitus  Father, Mother. Hypertension  Father, Mother. Kidney Disease  Mother.  Other Problems David Hiss M. Redmond Pulling, MD; 08/22/2016 4:30 PM) Arthritis  Back Pain  Hypercholesterolemia  Inguinal Hernia  Diabetes Mellitus  High blood pressure     Review of Systems Malachy Moan RMA; 08/22/2016 3:31 PM) General Present- Weight Loss. Not Present- Appetite Loss, Chills, Fatigue, Fever, Night Sweats and Weight Gain. Skin Not Present- Change in Wart/Mole, Dryness, Hives, Jaundice, New Lesions, Non-Healing Wounds, Rash and Ulcer. HEENT Present- Visual Disturbances. Not Present- Earache, Hearing Loss, Hoarseness, Nose Bleed, Oral Ulcers, Ringing in the Ears, Seasonal Allergies, Sinus Pain, Sore Throat, Wears glasses/contact lenses and Yellow Eyes. Respiratory Not Present- Bloody sputum, Chronic Cough, Difficulty Breathing, Snoring and Wheezing. Cardiovascular Not Present- Chest Pain, Difficulty Breathing Lying Down, Leg Cramps, Palpitations, Rapid Heart Rate, Shortness of Breath and Swelling of Extremities. Gastrointestinal Present- Abdominal Pain and Chronic diarrhea. Not Present- Bloating, Bloody Stool, Change in Bowel Habits, Constipation, Difficulty Swallowing, Excessive gas, Gets full quickly at meals, Hemorrhoids, Indigestion, Nausea, Rectal Pain and Vomiting. Male Genitourinary Present- Nocturia and Urine Leakage. Not Present- Blood in Urine, Change in Urinary Stream, Frequency, Impotence, Painful Urination and Urgency. Musculoskeletal Present- Back Pain. Not Present- Joint Pain, Joint Stiffness, Muscle Pain, Muscle Weakness and Swelling of Extremities. Neurological Not Present-  Decreased Memory, Fainting, Headaches, Numbness, Seizures, Tingling, Tremor, Trouble walking and Weakness. Psychiatric Present- Depression. Not Present- Anxiety, Bipolar, Change in Sleep Pattern, Fearful  and Frequent crying. Endocrine Not Present- Cold Intolerance, Excessive Hunger, Hair Changes, Heat Intolerance, Hot flashes and New Diabetes.  Vitals Malachy Moan RMA; 08/22/2016 3:33 PM) 08/22/2016 3:32 PM Weight: 164.2 lb Height: 68in Body Surface Area: 1.88 m Body Mass Index: 24.97 kg/m  Temp.: 98.54F  Pulse: 80 (Regular)  BP: 150/84 (Sitting, Left Arm, Standard)       Physical Exam David Hiss M. Makayela Secrest MD; 08/22/2016 4:26 PM) General Mental Status-Alert. General Appearance-Consistent with stated age. Hydration-Well hydrated. Voice-Normal.  Head and Neck Head-normocephalic, atraumatic with no lesions or palpable masses. Trachea-midline. Thyroid Gland Characteristics - normal size and consistency.  Eye Eyeball - Bilateral-Extraocular movements intact. Sclera/Conjunctiva - Bilateral-No scleral icterus.  ENMT Note: normal ext ears lips intact   Chest and Lung Exam Chest and lung exam reveals -quiet, even and easy respiratory effort with no use of accessory muscles and on auscultation, normal breath sounds, no adventitious sounds and normal vocal resonance. Inspection Chest Wall - Normal. Back - normal.  Breast - Did not examine.  Cardiovascular Cardiovascular examination reveals -normal heart sounds, regular rate and rhythm with no murmurs and normal pedal pulses bilaterally.  Abdomen Inspection Inspection of the abdomen reveals - No Hernias. Skin - Scar - no surgical scars. Palpation/Percussion Palpation and Percussion of the abdomen reveal - Soft, Non Tender, No Rebound tenderness, No Rigidity (guarding) and No hepatosplenomegaly. Auscultation Auscultation of the abdomen reveals - Bowel sounds normal.  Male  Genitourinary Note: old right groin incision, both testicles down, +bulge with valsalva on Left; reducible, nontender   Peripheral Vascular Upper Extremity Palpation - Pulses bilaterally normal.  Neurologic Neurologic evaluation reveals -alert and oriented x 3 with no impairment of recent or remote memory. Mental Status-Normal.  Neuropsychiatric The patient's mood and affect are described as -normal. Judgment and Insight-insight is appropriate concerning matters relevant to self.  Musculoskeletal Normal Exam - Left-Upper Extremity Strength Normal and Lower Extremity Strength Normal. Normal Exam - Right-Upper Extremity Strength Normal and Lower Extremity Strength Normal. Note: walks with slight limp; uses cane   Lymphatic Head & Neck  General Head & Neck Lymphatics: Bilateral - Description - Normal. Axillary - Did not examine. Femoral & Inguinal - Did not examine.    Assessment & Plan David Hiss M. Haiden Clucas MD; 08/22/2016 4:30 PM) LEFT GROIN HERNIA (K40.90) Impression: We discussed the etiology of inguinal hernias. We discussed the signs & symptoms of incarceration & strangulation. We discussed non-operative and operative management.  The patient has elected to proceed with OPEN REPAIR OF LEFT INGUINAL HERNIA WITH MESH   I described the procedure in detail. The patient was given educational material. We discussed the risks and benefits including but not limited to bleeding, infection, chronic inguinal pain, nerve entrapment, hernia recurrence, mesh complications, hematoma formation, urinary retention, injury to the testicles, numbness in the groin, blood clots, injury to the surrounding structures, and anesthesia risk. We also discussed the typical post operative recovery course, including no heavy lifting for 4-6 weeks. I explained that the likelihood of improvement of their symptoms is good  In the interim he is waiting to hear on his disability paperwork and trying to  get permanent blotching. He states that he would be able to find somewhere to stay if needed. I advised him that he would need a place to recover from surgery Current Plans Pt Education - Pamphlet Given - Hernia Surgery: discussed with patient and provided information. You are being scheduled for surgery- Our schedulers will call you.  You should hear from  our office's scheduling department within 5 working days about the location, date, and time of surgery. We try to make accommodations for patient's preferences in scheduling surgery, but sometimes the OR schedule or the surgeon's schedule prevents [place] from making those accommodations.  If you have not heard from our office [phone]) in 5 working days, call the office and ask for your surgeon's nurse.  If you have other questions about your diagnosis, plan, or surgery, call the office and ask for your surgeon's nurse.  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 He voiced understanding. We also talked about tobacco cessation. HYPERTENSION, ESSENTIAL (I10) DIABETES MELLITUS, STABLE (E11.9)  Leighton Ruff. Redmond Pulling, MD, FACS General, Bariatric, & Minimally Invasive Surgery [facility], [place]

## 2016-09-06 NOTE — Discharge Instructions (Signed)
[facility]  UMBILICAL OR INGUINAL HERNIA REPAIR: POST OP INSTRUCTIONS  Always review your discharge instruction sheet given to you by the facility where your surgery was performed. IF YOU HAVE DISABILITY OR FAMILY LEAVE FORMS, YOU MUST BRING THEM TO THE OFFICE FOR PROCESSING.   DO NOT GIVE THEM TO YOUR DOCTOR.  1. A  prescription for pain medication may be given to you upon discharge.  Take your pain medication as prescribed, if needed.  If narcotic pain medicine is not needed, then you may take acetaminophen (Tylenol) &/or ibuprofen (Advil) as needed. FOLLOW PACKAGE DIRECTIONS.  ALTERNATE TAKING TYLENOL AND IBUPROFEN 2. Take your usually prescribed medications unless otherwise directed. 3. If you need a refill on your pain medication, please contact your pharmacy.  They will contact our office to request authorization. Prescriptions will not be filled after 5 pm or on week-ends. 4. You should follow a light diet the first 24 hours after arrival home, such as soup and crackers, etc.  Be sure to include lots of fluids daily.  Resume your normal diet the day after surgery. 5. Most patients will experience some swelling and bruising around the umbilicus or in the groin and scrotum.  Ice packs and reclining will help.  Swelling and bruising can take several days to resolve.  6. It is common to experience some constipation if taking pain medication after surgery.  Increasing fluid intake and taking a stool softener (such as Colace) will usually help or prevent this problem from occurring.  A mild laxative (Milk of Magnesia or Miralax) should be taken according to package directions if there are no bowel movements after 48 hours. 7.   If your surgeon used skin glue on the incision, you may shower in 24 hours.  The glue will flake off over the next 2-3 weeks.  Any sutures or staples will be removed at the office during your follow-up visit. 8. ACTIVITIES:  You may resume regular (light)  daily activities beginning the next day--such as daily self-care, walking, climbing stairs--gradually increasing activities as tolerated.  You may have sexual intercourse when it is comfortable.  Refrain from any heavy lifting or straining until approved by your doctor. a. You may drive when you are no longer taking prescription pain medication, you can comfortably wear a seatbelt, and you can safely maneuver your car and apply brakes. b. RETURN TO WORK:  9. You should see your doctor in the office for a follow-up appointment approximately 2-3 weeks after your surgery.  Make sure that you call for this appointment within a day or two after you arrive home to insure a convenient appointment time. 10. OTHER INSTRUCTIONS: DO NOT LIFT/PUSH/PULL ANYTHING GREATER THAN 10 LBS FOR 6 WEEKS   WHEN TO CALL YOUR DOCTOR: 1. Fever over 101.0 2. Inability to urinate 3. Nausea and/or vomiting 4. Extreme swelling or bruising 5. Continued bleeding from incision. 6. Increased pain, redness, or drainage from the incision  The clinic staff is available to answer your questions during regular business hours.  Please dont hesitate to call and ask to speak to one of the nurses for clinical concerns.  If you have a medical emergency, go to the nearest emergency room or call 911.  A surgeon from [facility] is always on call at the hospital   [address], [place], [place], [place]  [address] ?  [address], [place], [place]   [address] [phone] ? [phone] ? FAX [phone]  Web site: [email or web address]

## 2016-09-06 NOTE — Anesthesia Postprocedure Evaluation (Signed)
Anesthesia Post Note  Patient: David Wyatt  Procedure(s) Performed: Procedure(s) (LRB): OPEN REPAIR LEFT INGUINAL HERNIA (Left) INSERTION OF MESH (Left)     Patient location during evaluation: PACU Anesthesia Type: General Level of consciousness: awake and alert Pain management: pain level controlled Vital Signs Assessment: post-procedure vital signs reviewed and stable Respiratory status: spontaneous breathing, nonlabored ventilation, respiratory function stable and patient connected to nasal cannula oxygen Cardiovascular status: blood pressure returned to baseline and stable Postop Assessment: no signs of nausea or vomiting Anesthetic complications: no    Last Vitals:  Vitals:   09/06/16 0955 09/06/16 1011  BP: (!) 150/80 (!) 154/96  Pulse: 72 71  Resp: 13   Temp: 36.6 C     Last Pain:  Vitals:   09/06/16 1011  TempSrc:   PainSc: 3                  Catalina Gravel

## 2016-09-06 NOTE — Anesthesia Preprocedure Evaluation (Addendum)
Anesthesia Evaluation  Patient identified by MRN, date of birth, ID band Patient awake    Reviewed: Allergy & Precautions, NPO status , Patient's Chart, lab work & pertinent test results  Airway Mallampati: II  TM Distance: >3 FB Neck ROM: Full    Dental  (+) Dental Advisory Given, Edentulous Lower, Edentulous Upper   Pulmonary Current Smoker,    Pulmonary exam normal breath sounds clear to auscultation       Cardiovascular hypertension, Pt. on medications Normal cardiovascular exam Rhythm:Regular Rate:Normal     Neuro/Psych negative neurological ROS  negative psych ROS   GI/Hepatic GERD  ,(+)     substance abuse  cocaine use,   Endo/Other  diabetes, Type 2, Oral Hypoglycemic Agents  Renal/GU negative Renal ROS     Musculoskeletal  (+) Arthritis ,   Abdominal   Peds  Hematology negative hematology ROS (+)   Anesthesia Other Findings Day of surgery medications reviewed with the patient.  Reproductive/Obstetrics                           Anesthesia Physical Anesthesia Plan  ASA: II  Anesthesia Plan: General   Post-op Pain Management:  Regional for Post-op pain   Induction: Intravenous  PONV Risk Score and Plan: 1 and Ondansetron and Midazolam  Airway Management Planned: Oral ETT  Additional Equipment:   Intra-op Plan:   Post-operative Plan: Extubation in OR  Informed Consent: I have reviewed the patients History and Physical, chart, labs and discussed the procedure including the risks, benefits and alternatives for the proposed anesthesia with the patient or authorized representative who has indicated his/her understanding and acceptance.   Dental advisory given  Plan Discussed with: CRNA  Anesthesia Plan Comments: (Risks/benefits of general anesthesia discussed with patient including risk of damage to teeth, lips, gum, and tongue, nausea/vomiting, allergic reactions to  medications, and the possibility of heart attack, stroke and death.  All patient questions answered.  Patient wishes to proceed.)        Anesthesia Quick Evaluation

## 2016-09-06 NOTE — Anesthesia Procedure Notes (Signed)
Anesthesia Regional Block: TAP block   Pre-Anesthetic Checklist: ,, timeout performed, Correct Patient, Correct Site, Correct Laterality, Correct Procedure, Correct Position, site marked, Risks and benefits discussed,  Surgical consent,  Pre-op evaluation,  At surgeon's request and post-op pain management  Laterality: Left  Prep: chloraprep       Needles:  Injection technique: Single-shot  Needle Type: Echogenic Needle     Needle Length: 9cm  Needle Gauge: 21     Additional Needles:   Procedures: ultrasound guided,,,,,,,,  Narrative:  Start time: 09/06/2016 7:05 AM End time: 09/06/2016 7:10 AM Injection made incrementally with aspirations every 5 mL.  Performed by: Personally  Anesthesiologist: Catalina Gravel  Additional Notes: No pain on injection. No increased resistance to injection. Injection made in 5cc increments.  Good needle visualization.  Patient tolerated procedure well.

## 2016-09-07 ENCOUNTER — Encounter (HOSPITAL_COMMUNITY): Payer: Self-pay | Admitting: Emergency Medicine

## 2016-09-07 ENCOUNTER — Emergency Department (HOSPITAL_COMMUNITY)
Admission: EM | Admit: 2016-09-07 | Discharge: 2016-09-07 | Disposition: A | Discharge status: Home / Self Care | Payer: Medicaid Other | Attending: Emergency Medicine | Admitting: Emergency Medicine

## 2016-09-07 DIAGNOSIS — E7800 Pure hypercholesterolemia, unspecified: Secondary | ICD-10-CM | POA: Insufficient documentation

## 2016-09-07 DIAGNOSIS — Z9889 Other specified postprocedural states: Secondary | ICD-10-CM | POA: Diagnosis present

## 2016-09-07 DIAGNOSIS — I10 Essential (primary) hypertension: Secondary | ICD-10-CM | POA: Insufficient documentation

## 2016-09-07 DIAGNOSIS — F17210 Nicotine dependence, cigarettes, uncomplicated: Secondary | ICD-10-CM | POA: Insufficient documentation

## 2016-09-07 DIAGNOSIS — E1140 Type 2 diabetes mellitus with diabetic neuropathy, unspecified: Secondary | ICD-10-CM | POA: Insufficient documentation

## 2016-09-07 DIAGNOSIS — Z5189 Encounter for other specified aftercare: Secondary | ICD-10-CM

## 2016-09-07 LAB — COMPREHENSIVE METABOLIC PANEL
ALBUMIN: 3.5 g/dL (ref 3.5–5.0)
ALT: 15 U/L — ABNORMAL LOW (ref 17–63)
AST: 35 U/L (ref 15–41)
Alkaline Phosphatase: 96 U/L (ref 38–126)
Anion gap: 13 (ref 5–15)
BUN: 14 mg/dL (ref 6–20)
CHLORIDE: 104 mmol/L (ref 101–111)
CO2: 24 mmol/L (ref 22–32)
Calcium: 9 mg/dL (ref 8.9–10.3)
Creatinine, Ser: 1.1 mg/dL (ref 0.61–1.24)
GFR calc Af Amer: >60 mL/min (ref >60)
Glucose, Bld: 186 mg/dL — ABNORMAL HIGH (ref 65–99)
POTASSIUM: 3.8 mmol/L (ref 3.5–5.1)
Sodium: 141 mmol/L (ref 135–145)
Total Bilirubin: 0.8 mg/dL (ref 0.3–1.2)
Total Protein: 6.7 g/dL (ref 6.5–8.1)

## 2016-09-07 LAB — CBC WITH DIFFERENTIAL/PLATELET
Basophils Absolute: 0 10*3/uL (ref 0.0–0.1)
Basophils Relative: 0 %
EOS PCT: 0 %
Eosinophils Absolute: 0 10*3/uL (ref 0.0–0.7)
HEMATOCRIT: 39.2 % (ref 39.0–52.0)
HEMOGLOBIN: 12.7 g/dL — AB (ref 13.0–17.0)
LYMPHS ABS: 2.7 10*3/uL (ref 0.7–4.0)
LYMPHS PCT: 16 %
MCH: 30.1 pg (ref 26.0–34.0)
MCHC: 32.4 g/dL (ref 30.0–36.0)
MCV: 92.9 fL (ref 78.0–100.0)
Monocytes Absolute: 1 10*3/uL (ref 0.1–1.0)
Monocytes Relative: 6 %
Neutro Abs: 13 10*3/uL — ABNORMAL HIGH (ref 1.7–7.7)
Neutrophils Relative %: 78 %
PLATELETS: 352 10*3/uL (ref 150–400)
RBC: 4.22 MIL/uL (ref 4.22–5.81)
RDW: 14 % (ref 11.5–15.5)
WBC: 16.7 10*3/uL — AB (ref 4.0–10.5)

## 2016-09-07 NOTE — Discharge Instructions (Signed)
You were seen in the ED today for a post-op wound check. The wound is well appearing. Call your surgeon on Monday to confirm your follow up appointment. Return to the ED with any severe wound pain, fever, chills, redness around the incision, or drainage from the incision.

## 2016-09-07 NOTE — ED Notes (Signed)
Pt states he is still unable to provide urine specimen 

## 2016-09-07 NOTE — ED Triage Notes (Signed)
Pt reports he had hernia to left groin yesterday. Was at home when he noticed swelling to groin. Pt has been using ice pack and has experienced improvement in swelling. Pt also reports area is numb.

## 2016-09-07 NOTE — ED Provider Notes (Signed)
Emergency Department Provider Note   I have reviewed the triage vital signs and the nursing notes.   HISTORY  Chief Complaint Post-op Problem   HPI David Wyatt is a 56 y.o. male with PMH of HTN, HLD, DM, POD 1 s/p left indirect inguinal hernia repair presents to the emergency department for evaluation of swelling in the area of the incision. He denies any pain in that area. No drainage. He was applying ice this morning when he noticed the swelling just over the incision. No redness or warmth. He denies any fevers or shaking chills. No scrotal edema or pain. He has some numbness around the incision site as well but states this seems to be resolving slightly. He is passing gas but has not yet had a bowel movement. No nausea or vomiting. No dysuria, hesitancy, or urgency.    Past Medical History:  Diagnosis Date  . Angioedema of lips 07/28/2012   left upper (07/29/2012)  . Arthritis    hands and back  . Chronic back pain   . Cocaine abuse   . Diabetic neuropathy ([place])   . High cholesterol   . Hypertension   . Neuropathy   . Type II diabetes mellitus [facility])     Patient Active Problem List   Diagnosis Date Noted  . GERD (gastroesophageal reflux disease) 06/13/2015  . Controlled type 2 diabetes mellitus with complication, without Cleatus Gabriel-term current use of insulin ([place]) 05/01/2015  . Diabetic neuropathy ([place]) 05/01/2015  . Essential hypertension, benign 11/01/2013  . Lumbar disc herniation 09/13/2013  . Unspecified constipation 09/13/2013  . Diabetes mellitus due to underlying condition without complications ([place]) 27/05/5007  . Chronic back pain 09/13/2013  . Viral gastroenteritis 05/05/2013  . Hypoglycemia 05/05/2013  . Felon of finger of left hand with lymphangitis 12/13/2012  . Hyperglycemia 07/29/2012  . Angioedema of lips 07/29/2012  . Crack cocaine use 07/29/2012  . Arthritis 07/29/2012  . Weakness generalized 07/29/2012    Past Surgical History:  Procedure  Laterality Date  . BACK SURGERY    . HERNIA REPAIR Right 04/01/2012  . I&D EXTREMITY Left 12/13/2012   Procedure: IRRIGATION AND DEBRIDEMENT LEFT THUMB;  Surgeon: Tennis Must, MD;  Location: [place];  Service: Orthopedics;  Laterality: Left;  . INCISION AND DRAINAGE OF WOUND     boil on back/notes 07/14/2008 (07/29/2012)  . INGUINAL HERNIA REPAIR  04/01/2012   Procedure: HERNIA REPAIR INGUINAL ADULT;  Surgeon: Haywood Lasso, MD;  Location: [place];  Service: General;  Laterality: Right;  . TONSILLECTOMY      Current Outpatient Rx  . Order #: [id number] Class: Normal  . Order #: [id number] Class: Normal  . Order #: [id number] Class: Normal  . Order #: [id number] Class: Normal  . Order #: [id number] Class: Print  . Order #: [id number] Class: Print  . Order #: [id number] Class: Normal  . Order #: [id number] Class: Normal  . Order #: [id number] Class: Print  . Order #: [id number] Class: Print  . Order #: [id number] Class: Normal    Allergies Lisinopril  Family History  Problem Relation Age of Onset  . Diabetes Father     Social History Social History  Substance Use Topics  . Smoking status: Current Every Day Smoker    Packs/day: 0.25    Years: 30.00    Types: Cigarettes  . Smokeless tobacco: Former Systems developer    Quit date: 08/15/2015     Comment: 4 cigs daily  . Alcohol use No    Review of Systems  Constitutional: No fever/chills Eyes: No visual  changes. ENT: No sore throat. Cardiovascular: Denies chest pain. Respiratory: Denies shortness of breath. Gastrointestinal: No abdominal pain.  No nausea, no vomiting.  No diarrhea.  No constipation. Genitourinary: Negative for dysuria. Musculoskeletal: Negative for back pain. Skin: Mild swelling near the left inguinal incision.  Neurological: Negative for headaches, focal weakness or numbness.  10-point ROS otherwise negative.  ____________________________________________   PHYSICAL EXAM:  VITAL SIGNS: ED Triage Vitals  Enc Vitals Group      BP 09/07/16 0849 129/80     Pulse Rate 09/07/16 0849 84     Resp 09/07/16 0849 18     Temp 09/07/16 0849 98.1 F (36.7 C)     Temp Source 09/07/16 0849 Oral     SpO2 09/07/16 0849 94 %     Weight 09/07/16 0849 171 lb (77.6 kg)     Height 09/07/16 0849 5\' 9"  (1.753 m)     Pain Score 09/07/16 0850 0   Constitutional: Alert and oriented. Well appearing and in no acute distress. Eyes: Conjunctivae are normal.  Head: Atraumatic. Nose: No congestion/rhinnorhea. Mouth/Throat: Mucous membranes are moist.  Oropharynx non-erythematous. Neck: No stridor. Cardiovascular: Normal rate, regular rhythm. Good peripheral circulation. Grossly normal heart sounds.   Respiratory: Normal respiratory effort.  No retractions. Lungs CTAB. Gastrointestinal: Soft and nontender. No distention. Well-appearing left inguinal incision without erythema, warmth, drainage, or fluctuance. No appreciable edema. No hernia present.  Musculoskeletal: No lower extremity tenderness nor edema. No gross deformities of extremities. Neurologic:  Normal speech and language. No gross focal neurologic deficits are appreciated.  Skin:  Skin is warm, dry and intact. No rash noted.  ____________________________________________   LABS (all labs ordered are listed, but only abnormal results are displayed)  Labs Reviewed  COMPREHENSIVE METABOLIC PANEL - Abnormal; Notable for the following:       Result Value   Glucose, Bld 186 (*)    ALT 15 (*)    All other components within normal limits  CBC WITH DIFFERENTIAL/PLATELET - Abnormal; Notable for the following:    WBC 16.7 (*)    Hemoglobin 12.7 (*)    Neutro Abs 13.0 (*)    All other components within normal limits  URINALYSIS, ROUTINE W REFLEX MICROSCOPIC   ____________________________________________   PROCEDURES  Procedure(s) performed:   Procedures  None ____________________________________________   INITIAL IMPRESSION / ASSESSMENT AND PLAN / ED  COURSE  Pertinent labs & imaging results that were available during my care of the patient were reviewed by me and considered in my medical decision making (see chart for details).  Patient presents to the emergency department for evaluation of left inguinal incision. He is postop day 1 after indirect left inguinal hernia repair with Dr. Redmond Pulling. The incision is very well-appearing with no erythema, fluctuance, drainage, induration, or other obvious abnormality. There is no hernia. No surrounding cellulitis. No evidence of seroma. Labs performed in triage are normal with the exception of leukocytosis would be expected after surgery. Patient has not had fevers and he is passing flatus. We will make the surgery team aware the patient presented to the emergency department but doubt additional imaging or treatment.   10:20 AM Spoke with the general surgery PA on call. Will message Dr. Redmond Pulling to make him aware. Appreciate assistance.   At this time, I do not feel there is any life-threatening condition present. I have reviewed and discussed all results (EKG, imaging, lab, urine as appropriate), exam findings with patient. I have reviewed nursing notes and appropriate previous records.  I feel the patient is safe to be discharged home without further emergent workup. Discussed usual and customary return precautions. Patient and family (if present) verbalize understanding and are comfortable with this plan.  Patient will follow-up with their primary care provider. If they do not have a primary care provider, information for follow-up has been provided to them. All questions have been answered.  ____________________________________________  FINAL CLINICAL IMPRESSION(S) / ED DIAGNOSES  Final diagnoses:  Visit for wound check     MEDICATIONS GIVEN DURING THIS VISIT:  Medications - No data to display   NEW OUTPATIENT MEDICATIONS STARTED DURING THIS VISIT:  None   Note:  This document was prepared  using Dragon voice recognition software and may include unintentional dictation errors.  Nanda Quinton, MD Emergency Medicine   Natsuko Kelsay, [facility], MD 09/07/16 [phone]

## 2016-09-09 ENCOUNTER — Encounter (HOSPITAL_COMMUNITY): Payer: Self-pay | Admitting: General Surgery

## 2016-11-07 ENCOUNTER — Emergency Department (HOSPITAL_COMMUNITY)
Admission: EM | Admit: 2016-11-07 | Discharge: 2016-11-07 | Disposition: A | Discharge status: Home / Self Care | Payer: Medicaid Other | Attending: Physician Assistant | Admitting: Physician Assistant

## 2016-11-07 ENCOUNTER — Encounter (HOSPITAL_COMMUNITY): Payer: Self-pay | Admitting: Emergency Medicine

## 2016-11-07 ENCOUNTER — Emergency Department (HOSPITAL_COMMUNITY): Payer: Medicaid Other

## 2016-11-07 DIAGNOSIS — Y990 Civilian activity done for income or pay: Secondary | ICD-10-CM | POA: Insufficient documentation

## 2016-11-07 DIAGNOSIS — Y929 Unspecified place or not applicable: Secondary | ICD-10-CM | POA: Diagnosis not present

## 2016-11-07 DIAGNOSIS — Z7984 Long term (current) use of oral hypoglycemic drugs: Secondary | ICD-10-CM | POA: Insufficient documentation

## 2016-11-07 DIAGNOSIS — Z79899 Other long term (current) drug therapy: Secondary | ICD-10-CM | POA: Insufficient documentation

## 2016-11-07 DIAGNOSIS — S61441A Puncture wound with foreign body of right hand, initial encounter: Secondary | ICD-10-CM | POA: Insufficient documentation

## 2016-11-07 DIAGNOSIS — E1140 Type 2 diabetes mellitus with diabetic neuropathy, unspecified: Secondary | ICD-10-CM | POA: Diagnosis not present

## 2016-11-07 DIAGNOSIS — W319XXA Contact with unspecified machinery, initial encounter: Secondary | ICD-10-CM | POA: Diagnosis not present

## 2016-11-07 DIAGNOSIS — Y939 Activity, unspecified: Secondary | ICD-10-CM | POA: Insufficient documentation

## 2016-11-07 DIAGNOSIS — I10 Essential (primary) hypertension: Secondary | ICD-10-CM | POA: Diagnosis not present

## 2016-11-07 DIAGNOSIS — F17210 Nicotine dependence, cigarettes, uncomplicated: Secondary | ICD-10-CM | POA: Diagnosis not present

## 2016-11-07 DIAGNOSIS — S6991XA Unspecified injury of right wrist, hand and finger(s), initial encounter: Secondary | ICD-10-CM | POA: Diagnosis present

## 2016-11-07 MED ORDER — IBUPROFEN 400 MG PO TABS
400.0000 mg | ORAL_TABLET | Freq: Once | ORAL | Status: AC | PRN
  Administered 2016-11-07: 400 mg via ORAL

## 2016-11-07 MED ORDER — IBUPROFEN 400 MG PO TABS
ORAL_TABLET | ORAL | Status: AC
  Filled 2016-11-07: qty 1

## 2016-11-07 MED ORDER — DOXYCYCLINE HYCLATE 100 MG PO CAPS: 100.0000 mg | ORAL_CAPSULE | Freq: Two times a day (BID) | ORAL | 0 refills | Status: DC

## 2016-11-07 MED ORDER — IBUPROFEN 600 MG PO TABS: 600.0000 mg | ORAL_TABLET | Freq: Four times a day (QID) | ORAL | 0 refills | Status: DC | PRN

## 2016-11-07 NOTE — ED Provider Notes (Signed)
[facility] DEPT Provider Note   CSN: [id number] Arrival date & time: 11/07/16  1402     History   Chief Complaint Chief Complaint  Patient presents with  . Hand Injury    HPI David Wyatt is a 56 y.o. male.  HPI   Right handed patient presents with injury to his right hand.  States he was at work and a piece of heavy machinery with a bolt sticking out fell on his hand.  Reports it was difficult to get the machine off.   Denies weakness or numbness of the hand/fingers.   Reports he had a tetanus vx in the past year.    Past Medical History:  Diagnosis Date  . Angioedema of lips 07/28/2012   left upper (07/29/2012)  . Arthritis    hands and back  . Chronic back pain   . Cocaine abuse   . Diabetic neuropathy ([place])   . High cholesterol   . Hypertension   . Neuropathy   . Type II diabetes mellitus [facility])     Patient Active Problem List   Diagnosis Date Noted  . GERD (gastroesophageal reflux disease) 06/13/2015  . Controlled type 2 diabetes mellitus with complication, without long-term current use of insulin ([place]) 05/01/2015  . Diabetic neuropathy ([place]) 05/01/2015  . Essential hypertension, benign 11/01/2013  . Lumbar disc herniation 09/13/2013  . Unspecified constipation 09/13/2013  . Diabetes mellitus due to underlying condition without complications ([place]) 85/88/5027  . Chronic back pain 09/13/2013  . Viral gastroenteritis 05/05/2013  . Hypoglycemia 05/05/2013  . Felon of finger of left hand with lymphangitis 12/13/2012  . Hyperglycemia 07/29/2012  . Angioedema of lips 07/29/2012  . Crack cocaine use 07/29/2012  . Arthritis 07/29/2012  . Weakness generalized 07/29/2012    Past Surgical History:  Procedure Laterality Date  . BACK SURGERY    . HERNIA REPAIR Right 04/01/2012  . I&D EXTREMITY Left 12/13/2012   Procedure: IRRIGATION AND DEBRIDEMENT LEFT THUMB;  Surgeon: Tennis Must, MD;  Location: [place];  Service: Orthopedics;  Laterality: Left;  . INCISION  AND DRAINAGE OF WOUND     boil on back/notes 07/14/2008 (07/29/2012)  . INGUINAL HERNIA REPAIR  04/01/2012   Procedure: HERNIA REPAIR INGUINAL ADULT;  Surgeon: Haywood Lasso, MD;  Location: [place];  Service: General;  Laterality: Right;  . INGUINAL HERNIA REPAIR Left 09/06/2016   Procedure: OPEN REPAIR LEFT INGUINAL HERNIA;  Surgeon: Greer Pickerel, MD;  Location: [place];  Service: General;  Laterality: Left;  . INSERTION OF MESH Left 09/06/2016   Procedure: INSERTION OF MESH;  Surgeon: Greer Pickerel, MD;  Location: [place];  Service: General;  Laterality: Left;  . TONSILLECTOMY         Home Medications    Prior to Admission medications   Medication Sig Start Date End Date Taking? Authorizing Provider  amLODipine (NORVASC) 2.5 MG tablet Take 1 tablet (2.5 mg total) by mouth daily. 08/13/16   Arnoldo Morale, MD  atorvastatin (LIPITOR) 40 MG tablet Take 1 tablet (40 mg total) by mouth daily. 08/13/16   Arnoldo Morale, MD  gabapentin (NEURONTIN) 300 MG capsule Take 1 capsule (300 mg total) by mouth 3 (three) times daily. 08/13/16   Arnoldo Morale, MD  glipiZIDE (GLUCOTROL) 5 MG tablet Take 0.5 tablets (2.5 mg total) by mouth daily before breakfast. 08/13/16   Arnoldo Morale, MD  ibuprofen (ADVIL,MOTRIN) 600 MG tablet Take 1 tablet (600 mg total) by mouth every 6 (six) hours as needed. 08/29/16  Shary Decamp, PA-C  loperamide (IMODIUM) 2 MG capsule Take 1 capsule (2 mg total) by mouth 4 (four) times daily as needed for diarrhea or loose stools. 08/29/16   Shary Decamp, PA-C  metFORMIN (GLUCOPHAGE) 500 MG tablet Take 2 tablets (1,000 mg total) by mouth 2 (two) times daily with a meal. 08/13/16   Arnoldo Morale, MD  naproxen (NAPROSYN) 500 MG tablet Take 1 tablet (500 mg total) by mouth 2 (two) times daily. 05/16/16   Arnoldo Morale, MD  ondansetron (ZOFRAN ODT) 4 MG disintegrating tablet Take 1 tablet (4 mg total) by mouth every 8 (eight) hours as needed for nausea or vomiting. 06/16/16   Esaw Grandchild, MD  oxyCODONE (OXY  IR/ROXICODONE) 5 MG immediate release tablet Take 1-2 tablets (5-10 mg total) by mouth every 6 (six) hours as needed for moderate pain, severe pain or breakthrough pain. 09/06/16   Greer Pickerel, MD  tiZANidine (ZANAFLEX) 4 MG tablet Take 1 tablet (4 mg total) by mouth every 8 (eight) hours as needed for muscle spasms. 08/13/16   Arnoldo Morale, MD    Family History Family History  Problem Relation Age of Onset  . Diabetes Father     Social History Social History  Substance Use Topics  . Smoking status: Current Every Day Smoker    Packs/day: 0.25    Years: 30.00    Types: Cigarettes  . Smokeless tobacco: Former Systems developer    Quit date: 08/15/2015     Comment: 4 cigs daily  . Alcohol use No     Allergies   Lisinopril   Review of Systems Review of Systems  Constitutional: Negative for chills and fever.  Musculoskeletal: Positive for arthralgias.  Skin: Positive for wound. Negative for color change and pallor.  Neurological: Negative for weakness and numbness.  Hematological: Does not bruise/bleed easily.  Psychiatric/Behavioral: Negative for self-injury.     Physical Exam Updated Vital Signs BP 121/79 (BP Location: Right Arm)   Pulse 84   Temp 97.9 F (36.6 C) (Oral)   Resp 16   Ht 5\' 9"  (1.753 m)   Wt 74.8 kg (165 lb)   SpO2 98%   BMI 24.37 kg/m   Physical Exam  Constitutional: He appears well-developed and well-nourished. No distress.  HENT:  Head: Normocephalic and atraumatic.  Neck: Neck supple.  Pulmonary/Chest: Effort normal.  Musculoskeletal:  Right hand with through and through puncture wound through the thenar eminence.  Exit through dorsal hand is tiny.  Entry wound is 1 cm diameter, circular.  No active bleeding.  Full active range of motion of all digits, strength 5/5, sensation intact, capillary refill < 2 seconds.    Neurological: He is alert.  Skin: He is not diaphoretic.  Nursing note and vitals reviewed.    ED Treatments / Results  Labs (all labs  ordered are listed, but only abnormal results are displayed) Labs Reviewed - No data to display  EKG  EKG Interpretation None       Radiology Dg Hand Complete Right  Result Date: 11/07/2016 CLINICAL DATA:  55 year old male with history of injury to the right hand after being struck by a bolt. Puncture wound. EXAM: RIGHT HAND - COMPLETE 3+ VIEW COMPARISON:  Right hand radiograph 10/16/2011. FINDINGS: No acute displaced fracture, subluxation or dislocation. Small soft tissue calcification identified between the fourth and fifth metacarpals, unchanged. New soft tissue calcification adjacent to the ulnar aspect of the base of the first proximal phalanx, which could be external debris or could be retained  radiopaque foreign body. There appears to be some gas in the soft tissues of the palm between the first and second metacarpals appear Mild multifocal degenerative changes of osteoarthritis. IMPRESSION: 1. No acute osseous abnormality of the right hand. 2. Probable small amount of gas in the soft tissues of the palm of the hand between the first and second metacarpals, which could relate to the reported puncture wound. There is also a new radiopaque foreign body in this region, which could be external, or could be within the soft tissues. Clinical correlation is suggested. 3. Mild degenerative changes of osteoarthritis. Electronically Signed   By: Vinnie Langton M.D.   On: 11/07/2016 16:38    Procedures Procedures (including critical care time)  Medications Ordered in ED Medications  ibuprofen (ADVIL,MOTRIN) tablet 400 mg (400 mg Oral Given 11/07/16 1538)     Initial Impression / Assessment and Plan / ED Course  I have reviewed the triage vital signs and the nursing notes.  Pertinent labs & imaging results that were available during my care of the patient were reviewed by me and considered in my medical decision making (see chart for details).    Afebrile, nontoxic patient with injury to  his right hand while helping a friend with his car, pinned by transmission with bolt puncturing his hand.   Xray demonstrates air in tissues and tiny foreign body.  No fracture.  Discussed with hand surgery nurse Jeani Hawking who agrees with wound care, wrapping wound, antibiotics, and f/u in office.   D/C home with doxycycline, hand surgery follow up.  Wound cleansed and dressed by nurse.    Discussed result, findings, treatment, and follow up  with patient.  Pt given return precautions.  Pt verbalizes understanding and agrees with plan.      Final Clinical Impressions(s) / ED Diagnoses   Final diagnoses:  Puncture wound of right hand with foreign body, initial encounter    New Prescriptions New Prescriptions   No medications on file     Clayton Bibles, Hershal Coria 11/07/16 1715    Macarthur Critchley, MD 11/07/16 2328

## 2016-11-07 NOTE — ED Triage Notes (Signed)
Pt states that a bolt fell on his right hand, puncture wound noted, bleeding controlled at this time.

## 2016-11-07 NOTE — Discharge Instructions (Signed)
Read the information below.  Use the prescribed medication as directed.  Please discuss all new medications with your pharmacist.  You may return to the Emergency Department at any time for worsening condition or any new symptoms that concern you.     If you develop redness, swelling, pus draining from the wound, or fevers greater than 100.4, return to the ER immediately for a recheck.   °

## 2016-11-07 NOTE — ED Notes (Signed)
Patient verbalized understanding of discharge instructions and denies any further needs or questions at this time. VS stable. Patient ambulatory with steady gait.  

## 2016-11-19 ENCOUNTER — Ambulatory Visit: Payer: Medicaid Other | Admitting: Family Medicine

## 2017-01-04 ENCOUNTER — Emergency Department (HOSPITAL_COMMUNITY)
Admission: EM | Admit: 2017-01-04 | Discharge: 2017-01-04 | Disposition: A | Discharge status: Home / Self Care | Payer: Medicaid Other | Attending: Emergency Medicine | Admitting: Emergency Medicine

## 2017-01-04 ENCOUNTER — Encounter (HOSPITAL_COMMUNITY): Payer: Self-pay | Admitting: Emergency Medicine

## 2017-01-04 DIAGNOSIS — W57XXXA Bitten or stung by nonvenomous insect and other nonvenomous arthropods, initial encounter: Secondary | ICD-10-CM | POA: Insufficient documentation

## 2017-01-04 DIAGNOSIS — R21 Rash and other nonspecific skin eruption: Secondary | ICD-10-CM | POA: Insufficient documentation

## 2017-01-04 DIAGNOSIS — Z7984 Long term (current) use of oral hypoglycemic drugs: Secondary | ICD-10-CM | POA: Diagnosis not present

## 2017-01-04 DIAGNOSIS — Z79899 Other long term (current) drug therapy: Secondary | ICD-10-CM | POA: Diagnosis not present

## 2017-01-04 DIAGNOSIS — I10 Essential (primary) hypertension: Secondary | ICD-10-CM | POA: Insufficient documentation

## 2017-01-04 DIAGNOSIS — K4090 Unilateral inguinal hernia, without obstruction or gangrene, not specified as recurrent: Secondary | ICD-10-CM | POA: Insufficient documentation

## 2017-01-04 DIAGNOSIS — E119 Type 2 diabetes mellitus without complications: Secondary | ICD-10-CM | POA: Insufficient documentation

## 2017-01-04 DIAGNOSIS — Z8719 Personal history of other diseases of the digestive system: Secondary | ICD-10-CM

## 2017-01-04 DIAGNOSIS — Z9889 Other specified postprocedural states: Secondary | ICD-10-CM

## 2017-01-04 DIAGNOSIS — F17210 Nicotine dependence, cigarettes, uncomplicated: Secondary | ICD-10-CM | POA: Insufficient documentation

## 2017-01-04 NOTE — ED Provider Notes (Signed)
[facility] EMERGENCY DEPARTMENT Provider Note   CSN: [id number] Arrival date & time: 01/04/17  0244     History   Chief Complaint Chief Complaint  Patient presents with  . Rash    HPI David Wyatt is a 56 y.o. male.  Patient presents with sudden onset rash tonight after going to bed. No other symptoms or known allergen contact. The rash itches. No bleeding or drainage. No fever. He states he moved to a new, furnished apartment today. He also complains of a recurrent left inguinal hernia after hernia repair last year. No ongoing symptoms of swelling or bulge.    The history is provided by the patient. No language interpreter was used.  Rash      Past Medical History:  Diagnosis Date  . Angioedema of lips 07/28/2012   left upper (07/29/2012)  . Arthritis    hands and back  . Chronic back pain   . Cocaine abuse ([place])   . Diabetic neuropathy ([place])   . High cholesterol   . Hypertension   . Neuropathy   . Type II diabetes mellitus [facility])     Patient Active Problem List   Diagnosis Date Noted  . GERD (gastroesophageal reflux disease) 06/13/2015  . Controlled type 2 diabetes mellitus with complication, without long-term current use of insulin ([place]) 05/01/2015  . Diabetic neuropathy ([place]) 05/01/2015  . Essential hypertension, benign 11/01/2013  . Lumbar disc herniation 09/13/2013  . Unspecified constipation 09/13/2013  . Diabetes mellitus due to underlying condition without complications ([place]) 02/40/9735  . Chronic back pain 09/13/2013  . Viral gastroenteritis 05/05/2013  . Hypoglycemia 05/05/2013  . Felon of finger of left hand with lymphangitis 12/13/2012  . Hyperglycemia 07/29/2012  . Angioedema of lips 07/29/2012  . Crack cocaine use 07/29/2012  . Arthritis 07/29/2012  . Weakness generalized 07/29/2012    Past Surgical History:  Procedure Laterality Date  . BACK SURGERY    . HERNIA REPAIR Right 04/01/2012  . I&D EXTREMITY Left 12/13/2012   Procedure: IRRIGATION AND DEBRIDEMENT LEFT THUMB;  Surgeon: Tennis Must, MD;  Location: [place];  Service: Orthopedics;  Laterality: Left;  . INCISION AND DRAINAGE OF WOUND     boil on back/notes 07/14/2008 (07/29/2012)  . INGUINAL HERNIA REPAIR  04/01/2012   Procedure: HERNIA REPAIR INGUINAL ADULT;  Surgeon: Haywood Lasso, MD;  Location: [place];  Service: General;  Laterality: Right;  . INGUINAL HERNIA REPAIR Left 09/06/2016   Procedure: OPEN REPAIR LEFT INGUINAL HERNIA;  Surgeon: Greer Pickerel, MD;  Location: [place];  Service: General;  Laterality: Left;  . INSERTION OF MESH Left 09/06/2016   Procedure: INSERTION OF MESH;  Surgeon: Greer Pickerel, MD;  Location: [place];  Service: General;  Laterality: Left;  . TONSILLECTOMY         Home Medications    Prior to Admission medications   Medication Sig Start Date End Date Taking? Authorizing Provider  amLODipine (NORVASC) 2.5 MG tablet Take 1 tablet (2.5 mg total) by mouth daily. 08/13/16   Arnoldo Morale, MD  atorvastatin (LIPITOR) 40 MG tablet Take 1 tablet (40 mg total) by mouth daily. 08/13/16   Arnoldo Morale, MD  doxycycline (VIBRAMYCIN) 100 MG capsule Take 1 capsule (100 mg total) by mouth 2 (two) times daily. One po bid x 7 days 11/07/16   [place]zerba[place], Emily, PA-C  gabapentin (NEURONTIN) 300 MG capsule Take 1 capsule (300 mg total) by mouth 3 (three) times daily. 08/13/16   Arnoldo Morale, MD  glipiZIDE (GLUCOTROL) 5 MG tablet Take 0.5 tablets (2.5 mg total) by mouth daily before breakfast. 08/13/16   Arnoldo Morale, MD  ibuprofen (ADVIL,MOTRIN) 600 MG tablet Take 1 tablet (600 mg total) by mouth every 6 (six) hours as needed for mild pain or moderate pain. 11/07/16   Clayton Bibles, PA-C  loperamide (IMODIUM) 2 MG capsule Take 1 capsule (2 mg total) by mouth 4 (four) times daily as needed for diarrhea or loose stools. 08/29/16   Shary Decamp, PA-C  metFORMIN (GLUCOPHAGE) 500 MG tablet Take 2 tablets (1,000 mg total) by mouth 2 (two) times daily with a meal. 08/13/16    Arnoldo Morale, MD  naproxen (NAPROSYN) 500 MG tablet Take 1 tablet (500 mg total) by mouth 2 (two) times daily. 05/16/16   Arnoldo Morale, MD  ondansetron (ZOFRAN ODT) 4 MG disintegrating tablet Take 1 tablet (4 mg total) by mouth every 8 (eight) hours as needed for nausea or vomiting. 06/16/16   Esaw Grandchild, MD  oxyCODONE (OXY IR/ROXICODONE) 5 MG immediate release tablet Take 1-2 tablets (5-10 mg total) by mouth every 6 (six) hours as needed for moderate pain, severe pain or breakthrough pain. 09/06/16   Greer Pickerel, MD  tiZANidine (ZANAFLEX) 4 MG tablet Take 1 tablet (4 mg total) by mouth every 8 (eight) hours as needed for muscle spasms. 08/13/16   Arnoldo Morale, MD    Family History Family History  Problem Relation Age of Onset  . Diabetes Father     Social History Social History  Substance Use Topics  . Smoking status: Current Every Day Smoker    Packs/day: 0.25    Years: 30.00    Types: Cigarettes  . Smokeless tobacco: Former Systems developer    Quit date: 08/15/2015     Comment: 4 cigs daily  . Alcohol use No     Allergies   Lisinopril   Review of Systems Review of Systems  Constitutional: Negative for fever.  Gastrointestinal: Negative for nausea.       See HPI.  Skin: Positive for rash.     Physical Exam Updated Vital Signs BP (!) 158/96   Pulse 73   Temp 98.2 F (36.8 C) (Oral)   SpO2 96%   Physical Exam  Constitutional: He is oriented to person, place, and time. He appears well-developed and well-nourished.  Neck: Normal range of motion.  Pulmonary/Chest: Effort normal.  Genitourinary:  Genitourinary Comments: Left inguinal area is unremarkable in appearance. Well healed surgical scar. No palpable hernia.   Musculoskeletal: Normal range of motion.  Neurological: He is alert and oriented to person, place, and time.  Skin: Skin is warm and dry.  Singular, raised bumps over neck, lower extremities and arms c/w bug bites, likely bed bugs.   Psychiatric: He has a normal  mood and affect.     ED Treatments / Results  Labs (all labs ordered are listed, but only abnormal results are displayed) Labs Reviewed - No data to display  EKG  EKG Interpretation None       Radiology No results found.  Procedures Procedures (including critical care time)  Medications Ordered in ED Medications - No data to display   Initial Impression / Assessment and Plan / ED Course  I have reviewed the triage vital signs and the nursing notes.  Pertinent labs & imaging results that were available during my care of the patient were reviewed by me and considered in my medical decision making (see chart for details).     Patient  presents with sudden onset rash after going to bed in a new apartment (furnished) tonight. Rash appearance and history c/w bed bugs.  No hernia present. Will refer to surgery if he continues to have symptoms.  Final Clinical Impressions(s) / ED Diagnoses   Final diagnoses:  None   1. Bug bites 2. History of left inguinal hernia.  New Prescriptions New Prescriptions   No medications on file     Charlann Lange, Hershal Coria 01/04/17 0403    Deno Etienne, DO 01/04/17 [id number]
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
## 2017-01-04 NOTE — ED Notes (Signed)
Pt states he does not have a ride home and need cab voucher; pt was informed by RN he is welcoming to sit in waiting room til he can get a ride; pt requested to speak with Charge; Charge notified and requested [employer] to escort pt out of room; Pt was upset that he was being asked to leave exam room; pt has exited room and now sitting in waiting room without incidence.

## 2017-01-04 NOTE — ED Notes (Signed)
ED Provider at bedside. 

## 2017-01-06 ENCOUNTER — Telehealth: Payer: Self-pay | Admitting: Family Medicine

## 2017-01-06 DIAGNOSIS — I10 Essential (primary) hypertension: Secondary | ICD-10-CM

## 2017-01-06 MED ORDER — AMLODIPINE BESYLATE 2.5 MG PO TABS: 2.5000 mg | ORAL_TABLET | Freq: Every day | ORAL | 0 refills | Status: DC

## 2017-01-06 MED FILL — AMLODIPINE BESYLATE 2.5 MG: 2.5 | 30 days supply | Qty: 30 | Fill #0

## 2017-01-06 NOTE — Telephone Encounter (Signed)
Patient needs a refill on BP medication amlodipine please send to [facility]

## 2017-01-06 NOTE — Addendum Note (Signed)
Addended by: Rica Mast on: 01/06/2017 10:45 AM   Modules accepted: Orders

## 2017-02-02 ENCOUNTER — Emergency Department (HOSPITAL_COMMUNITY)
Admission: EM | Admit: 2017-02-02 | Discharge: 2017-02-02 | Disposition: A | Discharge status: Home / Self Care | Payer: Medicaid Other | Attending: Emergency Medicine | Admitting: Emergency Medicine

## 2017-02-02 ENCOUNTER — Emergency Department (HOSPITAL_COMMUNITY): Payer: Medicaid Other

## 2017-02-02 DIAGNOSIS — R202 Paresthesia of skin: Secondary | ICD-10-CM

## 2017-02-02 DIAGNOSIS — R42 Dizziness and giddiness: Secondary | ICD-10-CM | POA: Diagnosis present

## 2017-02-02 DIAGNOSIS — Z7984 Long term (current) use of oral hypoglycemic drugs: Secondary | ICD-10-CM | POA: Diagnosis not present

## 2017-02-02 DIAGNOSIS — F17210 Nicotine dependence, cigarettes, uncomplicated: Secondary | ICD-10-CM | POA: Insufficient documentation

## 2017-02-02 DIAGNOSIS — I10 Essential (primary) hypertension: Secondary | ICD-10-CM | POA: Diagnosis not present

## 2017-02-02 DIAGNOSIS — H8110 Benign paroxysmal vertigo, unspecified ear: Secondary | ICD-10-CM | POA: Diagnosis not present

## 2017-02-02 DIAGNOSIS — Z79899 Other long term (current) drug therapy: Secondary | ICD-10-CM | POA: Insufficient documentation

## 2017-02-02 DIAGNOSIS — E1140 Type 2 diabetes mellitus with diabetic neuropathy, unspecified: Secondary | ICD-10-CM | POA: Insufficient documentation

## 2017-02-02 LAB — BASIC METABOLIC PANEL
Anion gap: 8 (ref 5–15)
BUN: 12 mg/dL (ref 6–20)
CO2: 23 mmol/L (ref 22–32)
Calcium: 9 mg/dL (ref 8.9–10.3)
Chloride: 106 mmol/L (ref 101–111)
Creatinine, Ser: 1.05 mg/dL (ref 0.61–1.24)
Glucose, Bld: 142 mg/dL — ABNORMAL HIGH (ref 65–99)
POTASSIUM: 4.1 mmol/L (ref 3.5–5.1)
SODIUM: 137 mmol/L (ref 135–145)

## 2017-02-02 LAB — CBC
HEMATOCRIT: 44.6 % (ref 39.0–52.0)
Hemoglobin: 14.9 g/dL (ref 13.0–17.0)
MCH: 31.2 pg (ref 26.0–34.0)
MCHC: 33.4 g/dL (ref 30.0–36.0)
MCV: 93.5 fL (ref 78.0–100.0)
PLATELETS: 312 10*3/uL (ref 150–400)
RBC: 4.77 MIL/uL (ref 4.22–5.81)
RDW: 13.6 % (ref 11.5–15.5)
WBC: 6.6 10*3/uL (ref 4.0–10.5)

## 2017-02-02 LAB — I-STAT TROPONIN, ED: Troponin i, poc: 0 ng/mL (ref 0.00–0.08)

## 2017-02-02 MED ORDER — MECLIZINE HCL 12.5 MG PO TABS: 12.5000 mg | ORAL_TABLET | Freq: Three times a day (TID) | ORAL | 0 refills | Status: DC | PRN

## 2017-02-02 MED ORDER — SODIUM CHLORIDE 0.9 % IV BOLUS (SEPSIS)
1000.0000 mL | Freq: Once | INTRAVENOUS | Status: AC
  Administered 2017-02-02: 1000 mL via INTRAVENOUS

## 2017-02-02 MED ORDER — LORAZEPAM 2 MG/ML IJ SOLN
1.0000 mg | Freq: Once | INTRAMUSCULAR | Status: AC
  Administered 2017-02-02: 1 mg via INTRAVENOUS
  Filled 2017-02-02: qty 1

## 2017-02-02 NOTE — ED Notes (Signed)
Pt states that there are bedbugs "where I stay. Look at these bites" bites noted on arms, bilateral.

## 2017-02-02 NOTE — ED Notes (Signed)
Ambulated Pt, Pt has strong and steady gait.

## 2017-02-02 NOTE — ED Provider Notes (Signed)
[facility] EMERGENCY DEPARTMENT Provider Note   CSN: [id number] Arrival date & time: 02/02/17  7829     History   Chief Complaint Chief Complaint  Patient presents with  . Dizziness    HPI  Blood pressure (!) 142/99, pulse 71, temperature 98.7 F (37.1 C), temperature source Oral, resp. rate (!) 22, height 5\' 8"  (1.727 m), SpO2 97 %.  David Wyatt is a 56 y.o. male with past medical history significant for diabetes, hypertension, hyperlipidemia, cocaine abuse complaining of acute onset of dizziness, room spinning when he stood up or when he moves his head onset this a.m.  He feels nauseous with no vomiting, comfortable when he is not moving.  He has had difficulty walking secondary to his dizziness.  He has not eaten anything this morning and he took his medic normal medications.  This patient notes that he has a right sided entire body (face, upper extremity lower extremity paresthesia which he describes as tingling.  Is no numbness or weakness, no change in his vision no difficulty swallowing, dysarthria or ataxia 3 weeks ago with the onset of the paresthesia.  Patient denies any chest pain, shortness of breath, palpitations, melena, hematochezia, diarrhea or nausea or vomiting, ear pressure, rhinorrhea, sinus congestion or tinnitus.  Past Medical History:  Diagnosis Date  . Angioedema of lips 07/28/2012   left upper (07/29/2012)  . Arthritis    hands and back  . Chronic back pain   . Cocaine abuse ([place])   . Diabetic neuropathy ([place])   . High cholesterol   . Hypertension   . Neuropathy   . Type II diabetes mellitus [facility])     Patient Active Problem List   Diagnosis Date Noted  . GERD (gastroesophageal reflux disease) 06/13/2015  . Controlled type 2 diabetes mellitus with complication, without long-term current use of insulin ([place]) 05/01/2015  . Diabetic neuropathy ([place]) 05/01/2015  . Essential hypertension, benign 11/01/2013  . Lumbar disc herniation  09/13/2013  . Unspecified constipation 09/13/2013  . Diabetes mellitus due to underlying condition without complications ([place]) 56/21/3086  . Chronic back pain 09/13/2013  . Viral gastroenteritis 05/05/2013  . Hypoglycemia 05/05/2013  . Felon of finger of left hand with lymphangitis 12/13/2012  . Hyperglycemia 07/29/2012  . Angioedema of lips 07/29/2012  . Crack cocaine use 07/29/2012  . Arthritis 07/29/2012  . Weakness generalized 07/29/2012    Past Surgical History:  Procedure Laterality Date  . BACK SURGERY    . HERNIA REPAIR Right 04/01/2012  . I&D EXTREMITY Left 12/13/2012   Procedure: IRRIGATION AND DEBRIDEMENT LEFT THUMB;  Surgeon: Tennis Must, MD;  Location: [place];  Service: Orthopedics;  Laterality: Left;  . INCISION AND DRAINAGE OF WOUND     boil on back/notes 07/14/2008 (07/29/2012)  . INGUINAL HERNIA REPAIR  04/01/2012   Procedure: HERNIA REPAIR INGUINAL ADULT;  Surgeon: Haywood Lasso, MD;  Location: [place];  Service: General;  Laterality: Right;  . INGUINAL HERNIA REPAIR Left 09/06/2016   Procedure: OPEN REPAIR LEFT INGUINAL HERNIA;  Surgeon: Greer Pickerel, MD;  Location: [facility];  Service: General;  Laterality: Left;  . INSERTION OF MESH Left 09/06/2016   Procedure: INSERTION OF MESH;  Surgeon: Greer Pickerel, MD;  Location: [facility];  Service: General;  Laterality: Left;  . TONSILLECTOMY         Home Medications    Prior to Admission medications   Medication Sig Start Date End Date Taking? Authorizing Provider  amLODipine (NORVASC) 2.5 MG  tablet Take 1 tablet (2.5 mg total) by mouth daily. 01/06/17   Tresa Garter, MD  atorvastatin (LIPITOR) 40 MG tablet Take 1 tablet (40 mg total) by mouth daily. 08/13/16   Arnoldo Morale, MD  doxycycline (VIBRAMYCIN) 100 MG capsule Take 1 capsule (100 mg total) by mouth 2 (two) times daily. One po bid x 7 days 11/07/16   [place], Emily, PA-C  gabapentin (NEURONTIN) 300 MG capsule Take 1 capsule (300 mg total) by mouth 3 (three) times  daily. 08/13/16   Arnoldo Morale, MD  glipiZIDE (GLUCOTROL) 5 MG tablet Take 0.5 tablets (2.5 mg total) by mouth daily before breakfast. 08/13/16   Arnoldo Morale, MD  ibuprofen (ADVIL,MOTRIN) 600 MG tablet Take 1 tablet (600 mg total) by mouth every 6 (six) hours as needed for mild pain or moderate pain. 11/07/16   Clayton Bibles, PA-C  loperamide (IMODIUM) 2 MG capsule Take 1 capsule (2 mg total) by mouth 4 (four) times daily as needed for diarrhea or loose stools. 08/29/16   Shary Decamp, PA-C  meclizine (ANTIVERT) 12.5 MG tablet Take 1 tablet (12.5 mg total) by mouth 3 (three) times daily as needed for dizziness. 02/02/17   Gor Vestal, Elmyra Ricks, PA-C  metFORMIN (GLUCOPHAGE) 500 MG tablet Take 2 tablets (1,000 mg total) by mouth 2 (two) times daily with a meal. 08/13/16   Arnoldo Morale, MD  naproxen (NAPROSYN) 500 MG tablet Take 1 tablet (500 mg total) by mouth 2 (two) times daily. 05/16/16   Arnoldo Morale, MD  ondansetron (ZOFRAN ODT) 4 MG disintegrating tablet Take 1 tablet (4 mg total) by mouth every 8 (eight) hours as needed for nausea or vomiting. 06/16/16   Esaw Grandchild, MD  oxyCODONE (OXY IR/ROXICODONE) 5 MG immediate release tablet Take 1-2 tablets (5-10 mg total) by mouth every 6 (six) hours as needed for moderate pain, severe pain or breakthrough pain. 09/06/16   Greer Pickerel, MD  tiZANidine (ZANAFLEX) 4 MG tablet Take 1 tablet (4 mg total) by mouth every 8 (eight) hours as needed for muscle spasms. 08/13/16   Arnoldo Morale, MD    Family History Family History  Problem Relation Age of Onset  . Diabetes Father     Social History Social History   Tobacco Use  . Smoking status: Current Every Day Smoker    Packs/day: 0.25    Years: 30.00    Pack years: 7.50    Types: Cigarettes  . Smokeless tobacco: Former Systems developer    Quit date: 08/15/2015  . Tobacco comment: 4 cigs daily  Substance Use Topics  . Alcohol use: No    Alcohol/week: 0.0 oz  . Drug use: Yes    Types: "Crack" cocaine    Comment: last use  08/23/16     Allergies   Lisinopril   Review of Systems Review of Systems  A complete review of systems was obtained and all systems are negative except as noted in the HPI and PMH.   Physical Exam Updated Vital Signs BP (!) 141/81   Pulse 68   Temp 98.7 F (37.1 C) (Oral)   Resp 16   Ht 5\' 8"  (1.727 m)   SpO2 100%   BMI 25.09 kg/m   Physical Exam  Constitutional: He is oriented to person, place, and time. He appears well-developed and well-nourished.  HENT:  Head: Normocephalic and atraumatic.  Mouth/Throat: Oropharynx is clear and moist.  No drooling or stridor. Posterior pharynx mildly erythematous no significant tonsillar hypertrophy. No exudate. Soft palate rises symmetrically. No TTP  or induration under tongue.   No tenderness to palpation of frontal or bilateral maxillary sinuses.  No mucosal edema in the nares.  Bilateral cerumen impaction, unable to visualize TMs.   Eyes: Conjunctivae and EOM are normal. Pupils are equal, round, and reactive to light.  Neck: Normal range of motion. Neck supple.  FROM to C-spine. Pt can touch chin to chest without discomfort. No TTP of midline cervical spine.   Cardiovascular: Normal rate, regular rhythm and intact distal pulses.  Pulmonary/Chest: Effort normal and breath sounds normal. No respiratory distress. He has no wheezes. He has no rales. He exhibits no tenderness.  Abdominal: Soft. Bowel sounds are normal. There is no tenderness.  Musculoskeletal: Normal range of motion. He exhibits no edema or tenderness.  Well-healed remote surgical scar to lumbar region  Neurological: He is alert and oriented to person, place, and time. No cranial nerve deficit.  II-Visual fields grossly intact. III/IV/VI-Extraocular movements intact.  Pupils reactive bilaterally. V/VII-Smile symmetric, equal eyebrow raise,  facial sensation intact VIII- Hearing grossly intact IX/X-Normal gag XI-bilateral shoulder shrug XII-midline tongue  extension Motor: 5/5 bilaterally with normal tone and bulk Cerebellar: Normal finger-to-nose  and normal heel-to-shin test.   Romberg negative Ambulates with a coordinated gait   Nursing note and vitals reviewed.    ED Treatments / Results  Labs (all labs ordered are listed, but only abnormal results are displayed) Labs Reviewed  BASIC METABOLIC PANEL - Abnormal; Notable for the following components:      Result Value   Glucose, Bld 142 (*)    All other components within normal limits  CBC  I-STAT TROPONIN, ED    EKG  EKG Interpretation  Date/Time:  Sunday February 02 2017 08:39:53 EST Ventricular Rate:  66 PR Interval:    QRS Duration: 88 QT Interval:  422 QTC Calculation: 443 R Axis:   -2 Text Interpretation:  Sinus rhythm Probable left atrial enlargement Minimal ST elevation, anterior leads No acute changes Confirmed by Brantley Stage [phone]) on 02/02/2017 11:33:01 AM       Radiology Ct Head Wo Contrast  Result Date: 02/02/2017 CLINICAL DATA:  Right-sided numbness.  Dizziness. EXAM: CT HEAD WITHOUT CONTRAST TECHNIQUE: Contiguous axial images were obtained from the base of the skull through the vertex without intravenous contrast. COMPARISON:  None. FINDINGS: Brain: Mild chronic ischemic white matter disease is noted. No mass effect or midline shift is noted. Ventricular size is within normal limits. There is no evidence of mass lesion, hemorrhage or acute infarction. Vascular: No hyperdense vessel or unexpected calcification. Skull: Normal. Negative for fracture or focal lesion. Sinuses/Orbits: No acute finding. Other: None. IMPRESSION: Mild chronic ischemic white matter disease. No acute intracranial abnormality seen. Electronically Signed   By: Marijo Conception, M.D.   On: 02/02/2017 09:45    Procedures Procedures (including critical care time)  Medications Ordered in ED Medications  sodium chloride 0.9 % bolus 1,000 mL (0 mLs Intravenous Stopped 02/02/17 1043)    LORazepam (ATIVAN) injection 1 mg (1 mg Intravenous Given 02/02/17 1006)     Initial Impression / Assessment and Plan / ED Course  I have reviewed the triage vital signs and the nursing notes.  Pertinent labs & imaging results that were available during my care of the patient were reviewed by me and considered in my medical decision making (see chart for details).     Vitals:   02/02/17 1000 02/02/17 1030 02/02/17 1100 02/02/17 1146  BP: 131/89 (!) 144/94 (!) 141/81  Pulse: 61 66 68 68  Resp: 17 18 19 16   Temp:      TempSrc:      SpO2: 98% 99% 97% 100%  Height:        Medications  sodium chloride 0.9 % bolus 1,000 mL (0 mLs Intravenous Stopped 02/02/17 1043)  LORazepam (ATIVAN) injection 1 mg (1 mg Intravenous Given 02/02/17 1006)    David Wyatt is 56 y.o. male presenting with vertiginous sensation upon waking this morning.  States it is worse with standing and head movement.  Eating and drinking normally.  Nonfocal neurologic exam.  This patient also notes paresthesia to the right side of his body onset 3 weeks ago.  He describes it as a pins and needles sensation, is not associated with weakness.  Patient with diabetes, hypertension, hyperlipidemia, will obtain CT, would consider MRI however this patient has hardware in his back.  Patient bolused, will check basic blood work orthostatics and Ativan for vertigo.  CT and blood work negative, patient feels improved after hydration and Ativan, he is ambulating independently without complication at this point.  I will print him out some information on the Epley maneuver, and have advised him to try to determine whether it is worse whether he moves his head to the right of the left so he could use the Epley maneuver appropriately.  He has an appointment with his primary care physician on the 30th.  The paresthesias that he feels on the entire left is nonacute, it started 3 weeks ago.  I do not think there needs to be an emergent workup  for this.  I discussed this with attending physician who agrees with care plan and stability for discharge.  I have given him a as needed referral to neurology but I think his primary care doctor should be consulted first before specialist.  Evaluation does not show pathology that would require ongoing emergent intervention or inpatient treatment. Pt is hemodynamically stable and mentating appropriately. Discussed findings and plan with patient/guardian, who agrees with care plan. All questions answered. Return precautions discussed and outpatient follow up given.      Final Clinical Impressions(s) / ED Diagnoses   Final diagnoses:  Benign paroxysmal positional vertigo, unspecified laterality  Paresthesia of left upper and lower extremity    ED Discharge Orders        Ordered    meclizine (ANTIVERT) 12.5 MG tablet  3 times daily PRN     02/02/17 1233       Essica Kiker, Charna Elizabeth 02/02/17 1238    Forde Dandy, MD 02/03/17 [phone]

## 2017-02-02 NOTE — Discharge Instructions (Signed)
Please follow with your primary care doctor in the next 2 days for a check-up. They must obtain records for further management.  ° °Do not hesitate to return to the Emergency Department for any new, worsening or concerning symptoms.  ° °
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
## 2017-02-05 MED FILL — GABAPENTIN 300 MG CAPSULE: 300 | 30 days supply | Qty: 90 | Fill #1

## 2017-02-07 ENCOUNTER — Encounter: Payer: Self-pay | Admitting: Family Medicine

## 2017-02-07 ENCOUNTER — Ambulatory Visit: Payer: Medicaid Other | Attending: Family Medicine | Admitting: Family Medicine

## 2017-02-07 VITALS — BP 146/84 | HR 86 | Temp 98.0°F | Ht 68.0 in | Wt 176.2 lb

## 2017-02-07 DIAGNOSIS — M5441 Lumbago with sciatica, right side: Secondary | ICD-10-CM | POA: Insufficient documentation

## 2017-02-07 DIAGNOSIS — R42 Dizziness and giddiness: Secondary | ICD-10-CM | POA: Diagnosis not present

## 2017-02-07 DIAGNOSIS — G8929 Other chronic pain: Secondary | ICD-10-CM | POA: Diagnosis not present

## 2017-02-07 DIAGNOSIS — M5442 Lumbago with sciatica, left side: Secondary | ICD-10-CM | POA: Insufficient documentation

## 2017-02-07 DIAGNOSIS — Z7984 Long term (current) use of oral hypoglycemic drugs: Secondary | ICD-10-CM | POA: Insufficient documentation

## 2017-02-07 DIAGNOSIS — Z23 Encounter for immunization: Secondary | ICD-10-CM | POA: Diagnosis not present

## 2017-02-07 DIAGNOSIS — E1140 Type 2 diabetes mellitus with diabetic neuropathy, unspecified: Secondary | ICD-10-CM | POA: Diagnosis not present

## 2017-02-07 DIAGNOSIS — Z79899 Other long term (current) drug therapy: Secondary | ICD-10-CM | POA: Insufficient documentation

## 2017-02-07 DIAGNOSIS — E1149 Type 2 diabetes mellitus with other diabetic neurological complication: Secondary | ICD-10-CM

## 2017-02-07 DIAGNOSIS — E118 Type 2 diabetes mellitus with unspecified complications: Secondary | ICD-10-CM | POA: Diagnosis not present

## 2017-02-07 DIAGNOSIS — Z9889 Other specified postprocedural states: Secondary | ICD-10-CM | POA: Insufficient documentation

## 2017-02-07 DIAGNOSIS — I10 Essential (primary) hypertension: Secondary | ICD-10-CM | POA: Insufficient documentation

## 2017-02-07 LAB — GLUCOSE, POCT (MANUAL RESULT ENTRY): POC Glucose: 198 mg/dl — AB (ref 70–99)

## 2017-02-07 MED ORDER — ATORVASTATIN CALCIUM 40 MG PO TABS: 40.0000 mg | ORAL_TABLET | Freq: Every day | ORAL | 6 refills | Status: DC

## 2017-02-07 MED ORDER — GLIPIZIDE 5 MG PO TABS: 2.5000 mg | ORAL_TABLET | Freq: Every day | ORAL | 6 refills | Status: DC

## 2017-02-07 MED ORDER — GABAPENTIN 300 MG PO CAPS: 300.0000 mg | ORAL_CAPSULE | Freq: Three times a day (TID) | ORAL | 6 refills | Status: DC

## 2017-02-07 MED ORDER — AMLODIPINE BESYLATE 2.5 MG PO TABS: 2.5000 mg | ORAL_TABLET | Freq: Every day | ORAL | 6 refills | Status: DC

## 2017-02-07 MED ORDER — METFORMIN HCL 500 MG PO TABS: 1000.0000 mg | ORAL_TABLET | Freq: Two times a day (BID) | ORAL | 6 refills | Status: DC

## 2017-02-07 NOTE — Progress Notes (Signed)
Pt states that entire right side has a tingling feeling.

## 2017-02-07 NOTE — Patient Instructions (Signed)
Diabetic Neuropathy Diabetic neuropathy is a nerve disease or nerve damage that is caused by diabetes mellitus. About half of all people with diabetes mellitus have some form of nerve damage. Nerve damage is more common in those who have had diabetes mellitus for many years and who generally have not had good control of their blood sugar (glucose) level. Diabetic neuropathy is a common complication of diabetes mellitus. There are three common types of diabetic neuropathy and a fourth type that is less common and less understood:  Peripheral neuropathy-This is the most common type of diabetic neuropathy. It causes damage to the nerves of the feet and legs first and then eventually the hands and arms. The damage affects the ability to sense touch.  Autonomic neuropathy-This type causes damage to the autonomic nervous system, which controls the following functions: ? Heartbeat. ? Body temperature. ? Blood pressure. ? Urination. ? Digestion. ? Sweating. ? Sexual function.  Focal neuropathy-Focal neuropathy can be painful and unpredictable and occurs most often in older adults with diabetes mellitus. It involves a specific nerve or one area and often comes on suddenly. It usually does not cause long-term problems.  Radiculoplexus neuropathy- Sometimes called lumbosacral radiculoplexus neuropathy, radiculoplexus neuropathy affects the nerves of the thighs, hips, buttocks, or legs. It is more common in people with type 2 diabetes mellitus and in older men. It is characterized by debilitating pain, weakness, and atrophy, usually in the thigh muscles.  What are the causes? The cause of peripheral, autonomic, and focal neuropathies is diabetes mellitus that is uncontrolled and high glucose levels. The cause of radiculoplexus neuropathy is unknown. However, it is thought to be caused by inflammation related to uncontrolled glucose levels. What are the signs or symptoms? Peripheral Neuropathy Peripheral  neuropathy develops slowly over time. When the nerves of the feet and legs no longer work there may be:  Burning, stabbing, or aching pain in the legs or feet.  Inability to feel pressure or pain in your feet. This can lead to: ? Thick calluses over pressure areas. ? Pressure sores. ? Ulcers.  Foot deformities.  Reduced ability to feel temperature changes.  Muscle weakness.  Autonomic Neuropathy The symptoms of autonomic neuropathy vary depending on which nerves are affected. Symptoms may include:  Problems with digestion, such as: ? Feeling sick to your stomach (nausea). ? Vomiting. ? Bloating. ? Constipation. ? Diarrhea. ? Abdominal pain.  Difficulty with urination. This occurs if you lose your ability to sense when your bladder is full. Problems include: ? Urine leakage (incontinence). ? Inability to empty your bladder completely (retention).  Rapid or irregular heartbeat (palpitations).  Blood pressure drops when you stand up (orthostatic hypotension). When you stand up you may feel: ? Dizzy. ? Weak. ? Faint.  In men, inability to attain and maintain an erection.  In women, vaginal dryness and problems with decreased sexual desire and arousal.  Problems with body temperature regulation.  Increased or decreased sweating.  Focal Neuropathy  Abnormal eye movements or abnormal alignment of both eyes.  Weakness in the wrist.  Foot drop. This results in an inability to lift the foot properly and abnormal walking or foot movement.  Paralysis on one side of your face (Bell palsy).  Chest or abdominal pain. Radiculoplexus Neuropathy  Sudden, severe pain in your hip, thigh, or buttocks.  Weakness and wasting of thigh muscles.  Difficulty rising from a seated position.  Abdominal swelling.  Unexplained weight loss (usually more than 10 lb [4.5 kg]). How is   this diagnosed? Peripheral Neuropathy Your senses may be tested. Sensory function testing can be  done with:  A light touch using a monofilament.  A vibration with tuning fork.  A sharp sensation with a pin prick.  Other tests that can help diagnose neuropathy are:  Nerve conduction velocity. This test checks the transmission of an electrical current through a nerve.  Electromyography. This shows how muscles respond to electrical signals transmitted by nearby nerves.  Quantitative sensory testing. This is used to assess how your nerves respond to vibrations and changes in temperature.  Autonomic Neuropathy Diagnosis is often based on reported symptoms. Tell your health care provider if you experience:  Dizziness.  Constipation.  Diarrhea.  Inappropriate urination or inability to urinate.  Inability to get or maintain an erection.  Tests that may be done include:  Electrocardiography or Holter monitor. These are tests that can help show problems with the heart rate or heart rhythm.  An X-ray exam may be done.  Focal Neuropathy Diagnosis is made based on your symptoms and what your health care provider finds during your exam. Other tests may be done. They may include:  Nerve conduction velocities. This checks the transmission of electrical current through a nerve.  Electromyography. This shows how muscles respond to electrical signals transmitted by nearby nerves.  Quantitative sensory testing. This test is used to assess how your nerves respond to vibration and changes in temperature.  Radiculoplexus Neuropathy  Often the first thing is to eliminate any other issue or problems that might be the cause, as there is no standard test for diagnosis.  X-ray exam of your spine and lumbar region.  Spinal tap to rule out cancer.  MRI to rule out other lesions. How is this treated? Once nerve damage occurs, it cannot be reversed. The goal of treatment is to keep the disease or nerve damage from getting worse and affecting more nerve fibers. Controlling your blood  glucose level is the key. Most people with radiculoplexus neuropathy see at least a partial improvement over time. You will need to keep your blood glucose and HbA1c levels in the target range determined by your health care provider. Things that help control blood glucose levels include:  Blood glucose monitoring.  Meal planning.  Physical activity.  Diabetes medicine.  Over time, maintaining lower blood glucose levels helps lessen symptoms. Sometimes, prescription pain medicine is needed. Follow these instructions at home:  Do not smoke.  Keep your blood glucose level in the range that you and your health care provider have determined acceptable for you.  Keep your blood pressure level in the range that you and your health care provider have determined acceptable for you.  Eat a well-balanced diet.  Be physically active every day. Include strength training and balance exercises.  Protect your feet. ? Check your feet every day for sores, cuts, blisters, or signs of infection. ? Wear padded socks and supportive shoes. Use orthotic inserts, if necessary. ? Regularly check the insides of your shoes for worn spots. Make sure there are no rocks or other items inside your shoes before you put them on. Contact a health care provider if:  You have burning, stabbing, or aching pain in the legs or feet.  You are unable to feel pressure or pain in your feet.  You develop problems with digestion such as: ? Nausea. ? Vomiting. ? Bloating. ? Constipation. ? Diarrhea. ? Abdominal pain.  You have difficulty with urination, such as: ? Incontinence. ? Retention.    You have palpitations.  You develop orthostatic hypotension. When you stand up you may feel: ? Dizzy. ? Weak. ? Faint.  You cannot attain and maintain an erection (in men).  You have vaginal dryness and problems with decreased sexual desire and arousal (in women).  You have severe pain in your thighs, legs, or  buttocks.  You have unexplained weight loss. This information is not intended to replace advice given to you by your health care provider. Make sure you discuss any questions you have with your health care provider. Document Released: 05/06/2001 Document Revised: 08/03/2015 Document Reviewed: 08/06/2012 [employer] Interactive Patient Education  2017 [employer]  

## 2017-02-08 LAB — HEMOGLOBIN A1C
ESTIMATED AVERAGE GLUCOSE: 180 mg/dL
HEMOGLOBIN A1C: 7.9 % — AB (ref 4.8–5.6)

## 2017-02-08 MED ORDER — GLIPIZIDE 5 MG PO TABS: 5.0000 mg | ORAL_TABLET | Freq: Every day | ORAL | 6 refills | Status: DC

## 2017-02-08 NOTE — Progress Notes (Signed)
Subjective:  Patient ID: David Wyatt, male    DOB: 1961-01-11  Age: 56 y.o. MRN: [id number]  CC: Diabetes and Hypertension   HPI David Wyatt is a 56 year old male with a history of type 2 diabetes mellitus (A1c 7.1), diabetic neuropathy, hypertension, chronic low back pain (status post previous back surgery) who comes into the clinic for follow-up visit.  He has been compliant with his diabetic medications and denies hypoglycemia, visual complaints.  He has not been compliant with a diabetic diet or exercise. He complains of paresthesia on the right side of his body which he describes as feeling like a 'band around his right hand;.  He has also been out of his Neurontin which he was prescribed for diabetic neuropathy.  Denies weakness in any extremity or speech difficulty but 'it feels like a weight on his left shoulder.'  CT head from 02/02/17 performed at an ED visit where he presented with vertigo was negative for acute abnormalities. He just picked up meclizine today.  He denies acute flares of his low back pain and is doing well in that regard.  Compliant with his antihypertensive and denies any adverse effects from his medication.  Past Medical History:  Diagnosis Date  . Angioedema of lips 07/28/2012   left upper (07/29/2012)  . Arthritis    hands and back  . Chronic back pain   . Cocaine abuse ([place])   . Diabetic neuropathy ([place])   . High cholesterol   . Hypertension   . Neuropathy   . Type II diabetes mellitus ([place])     Past Surgical History:  Procedure Laterality Date  . BACK SURGERY    . HERNIA REPAIR Right 04/01/2012  . I&D EXTREMITY Left 12/13/2012   Procedure: IRRIGATION AND DEBRIDEMENT LEFT THUMB;  Surgeon: Tennis Must, MD;  Location: [facility];  Service: Orthopedics;  Laterality: Left;  . INCISION AND DRAINAGE OF WOUND     boil on back/notes 07/14/2008 (07/29/2012)  . INGUINAL HERNIA REPAIR  04/01/2012   Procedure: HERNIA REPAIR INGUINAL ADULT;  Surgeon: Haywood Lasso, MD;  Location: [facility];  Service: General;  Laterality: Right;  . INGUINAL HERNIA REPAIR Left 09/06/2016   Procedure: OPEN REPAIR LEFT INGUINAL HERNIA;  Surgeon: Greer Pickerel, MD;  Location: [facility];  Service: General;  Laterality: Left;  . INSERTION OF MESH Left 09/06/2016   Procedure: INSERTION OF MESH;  Surgeon: Greer Pickerel, MD;  Location: [facility];  Service: General;  Laterality: Left;  . TONSILLECTOMY       Past Medical History:  Diagnosis Date  . Angioedema of lips 07/28/2012   left upper (07/29/2012)  . Arthritis    hands and back  . Chronic back pain   . Cocaine abuse ([place])   . Diabetic neuropathy ([place])   . High cholesterol   . Hypertension   . Neuropathy   . Type II diabetes mellitus ([place])     Past Surgical History:  Procedure Laterality Date  . BACK SURGERY    . HERNIA REPAIR Right 04/01/2012  . I&D EXTREMITY Left 12/13/2012   Procedure: IRRIGATION AND DEBRIDEMENT LEFT THUMB;  Surgeon: Tennis Must, MD;  Location: [facility];  Service: Orthopedics;  Laterality: Left;  . INCISION AND DRAINAGE OF WOUND     boil on back/notes 07/14/2008 (07/29/2012)  . INGUINAL HERNIA REPAIR  04/01/2012   Procedure: HERNIA REPAIR INGUINAL ADULT;  Surgeon: Haywood Lasso, MD;  Location: [facility];  Service: General;  Laterality: Right;  .  INGUINAL HERNIA REPAIR Left 09/06/2016   Procedure: OPEN REPAIR LEFT INGUINAL HERNIA;  Surgeon: Greer Pickerel, MD;  Location: [facility];  Service: General;  Laterality: Left;  . INSERTION OF MESH Left 09/06/2016   Procedure: INSERTION OF MESH;  Surgeon: Greer Pickerel, MD;  Location: [facility];  Service: General;  Laterality: Left;  . TONSILLECTOMY      Allergies  Allergen Reactions  . Lisinopril Other (See Comments)    Lip Swelling     Outpatient Medications Prior to Visit  Medication Sig Dispense Refill  . meclizine (ANTIVERT) 12.5 MG tablet Take 1 tablet (12.5 mg total) by mouth 3 (three) times daily as needed for dizziness. 30 tablet 0  . naproxen (NAPROSYN)  500 MG tablet Take 1 tablet (500 mg total) by mouth 2 (two) times daily. 60 tablet 3  . ondansetron (ZOFRAN ODT) 4 MG disintegrating tablet Take 1 tablet (4 mg total) by mouth every 8 (eight) hours as needed for nausea or vomiting. 20 tablet 0  . amLODipine (NORVASC) 2.5 MG tablet Take 1 tablet (2.5 mg total) by mouth daily. 30 tablet 0  . atorvastatin (LIPITOR) 40 MG tablet Take 1 tablet (40 mg total) by mouth daily. 30 tablet 3  . doxycycline (VIBRAMYCIN) 100 MG capsule Take 1 capsule (100 mg total) by mouth 2 (two) times daily. One po bid x 7 days 14 capsule 0  . glipiZIDE (GLUCOTROL) 5 MG tablet Take 0.5 tablets (2.5 mg total) by mouth daily before breakfast. 30 tablet 3  . ibuprofen (ADVIL,MOTRIN) 600 MG tablet Take 1 tablet (600 mg total) by mouth every 6 (six) hours as needed for mild pain or moderate pain. 15 tablet 0  . loperamide (IMODIUM) 2 MG capsule Take 1 capsule (2 mg total) by mouth 4 (four) times daily as needed for diarrhea or loose stools. 12 capsule 0  . metFORMIN (GLUCOPHAGE) 500 MG tablet Take 2 tablets (1,000 mg total) by mouth 2 (two) times daily with a meal. 120 tablet 3  . tiZANidine (ZANAFLEX) 4 MG tablet Take 1 tablet (4 mg total) by mouth every 8 (eight) hours as needed for muscle spasms. 90 tablet 3  . gabapentin (NEURONTIN) 300 MG capsule Take 1 capsule (300 mg total) by mouth 3 (three) times daily. (Patient not taking: Reported on 02/07/2017) 90 capsule 3  . oxyCODONE (OXY IR/ROXICODONE) 5 MG immediate release tablet Take 1-2 tablets (5-10 mg total) by mouth every 6 (six) hours as needed for moderate pain, severe pain or breakthrough pain. (Patient not taking: Reported on 02/07/2017) 20 tablet 0   No facility-administered medications prior to visit.     ROS Review of Systems  Constitutional: Negative for activity change and appetite change.  HENT: Negative for sinus pressure and sore throat.   Eyes: Negative for visual disturbance.  Respiratory: Negative for  cough, chest tightness and shortness of breath.   Cardiovascular: Negative for chest pain and leg swelling.  Gastrointestinal: Negative for abdominal distention, abdominal pain, constipation and diarrhea.  Endocrine: Negative.   Genitourinary: Negative for dysuria.  Musculoskeletal: Negative for joint swelling and myalgias.  Skin: Negative for rash.  Allergic/Immunologic: Negative.   Neurological: Positive for numbness. Negative for weakness.  Psychiatric/Behavioral: Negative for dysphoric mood and suicidal ideas.    Objective:  BP (!) 146/84   Pulse 86   Temp 98 F (36.7 C) (Oral)   Ht 5\' 8"  (1.727 m)   Wt 176 lb 3.2 oz (79.9 kg)   SpO2 98%   BMI 26.79  kg/m   BP/Weight 02/07/2017 02/02/2017 67/61/9509  Systolic BP 326 712 458  Diastolic BP 84 81 98  Wt. (Lbs) 176.2 - -  BMI 26.79 25.09 -      Physical Exam  Constitutional: He is oriented to person, place, and time. He appears well-developed and well-nourished.  Cardiovascular: Normal rate, normal heart sounds and intact distal pulses.  No murmur heard. Pulmonary/Chest: Effort normal and breath sounds normal. He has no wheezes. He has no rales. He exhibits no tenderness.  Abdominal: Soft. Bowel sounds are normal. He exhibits no distension and no mass. There is no tenderness.  Musculoskeletal: Normal range of motion.  Neurological: He is alert and oriented to person, place, and time. Coordination normal.  Skin: Skin is warm and dry.  Psychiatric: He has a normal mood and affect.      Assessment & Plan:   1. Controlled type 2 diabetes mellitus with complication, without long-term current use of insulin (HCC) Controlled with last A1c of 6.8 Diabetic diet, lifestyle modifications - POCT glucose (manual entry) - Hemoglobin A1c - metFORMIN (GLUCOPHAGE) 500 MG tablet; Take 2 tablets (1,000 mg total) by mouth 2 (two) times daily with a meal.  Dispense: 120 tablet; Refill: 6 - glipiZIDE (GLUCOTROL) 5 MG tablet; Take 0.5  tablets (2.5 mg total) by mouth daily before breakfast.  Dispense: 30 tablet; Refill: 6 - atorvastatin (LIPITOR) 40 MG tablet; Take 1 tablet (40 mg total) by mouth daily.  Dispense: 30 tablet; Refill: 6  2. Chronic bilateral low back pain with bilateral sciatica Acute flare - gabapentin (NEURONTIN) 300 MG capsule; Take 1 capsule (300 mg total) by mouth 3 (three) times daily.  Dispense: 90 capsule; Refill: 6  3. Essential hypertension, benign Mildly elevated Low-sodium diet - amLODipine (NORVASC) 2.5 MG tablet; Take 1 tablet (2.5 mg total) by mouth daily.  Dispense: 30 tablet; Refill: 6  4. Other diabetic neurological complication associated with type 2 diabetes mellitus ([place]) Uncontrolled due to being out of gabapentin This could also explain his glove and stocking paresthesia-head CT was unremarkable from 02/02/2017 Discussed warning signs of stroke and advised to present to the ED if he notices any of these. - gabapentin (NEURONTIN) 300 MG capsule; Take 1 capsule (300 mg total) by mouth 3 (three) times daily.  Dispense: 90 capsule; Refill: 6  5. Vertigo Just picked up meclizine which he is yet to commence  6. Need for influenza vaccination - Flu Vaccine QUAD 36+ mos IM   Meds ordered this encounter  Medications  . metFORMIN (GLUCOPHAGE) 500 MG tablet    Sig: Take 2 tablets (1,000 mg total) by mouth 2 (two) times daily with a meal.    Dispense:  120 tablet    Refill:  6  . glipiZIDE (GLUCOTROL) 5 MG tablet    Sig: Take 0.5 tablets (2.5 mg total) by mouth daily before breakfast.    Dispense:  30 tablet    Refill:  6  . gabapentin (NEURONTIN) 300 MG capsule    Sig: Take 1 capsule (300 mg total) by mouth 3 (three) times daily.    Dispense:  90 capsule    Refill:  6  . atorvastatin (LIPITOR) 40 MG tablet    Sig: Take 1 tablet (40 mg total) by mouth daily.    Dispense:  30 tablet    Refill:  6    Discontinue previous dose  . amLODipine (NORVASC) 2.5 MG tablet    Sig: Take 1  tablet (2.5 mg total) by mouth  daily.    Dispense:  30 tablet    Refill:  6    Must have office visit for refills    Follow-up: Return in about 3 months (around 05/08/2017) for Follow-up of chronic medical conditions.   Arnoldo Morale MD

## 2017-02-10 ENCOUNTER — Telehealth: Payer: Self-pay

## 2017-02-10 NOTE — Telephone Encounter (Signed)
Pt was called and informed of lab results and medication increase.

## 2017-03-31 ENCOUNTER — Encounter: Payer: Self-pay | Admitting: Pediatric Intensive Care

## 2017-03-31 ENCOUNTER — Telehealth: Payer: Self-pay

## 2017-03-31 NOTE — Telephone Encounter (Signed)
Appointment scheduled for 04/24/17 @ 1030.  V. Red Christians, RN notified.

## 2017-03-31 NOTE — Telephone Encounter (Signed)
Call received from [facility], [place].  She reported that the patient's BP was 102/62 this morning and he has not been taking his amlodipine.  She stated that he needs to pick up the medication from the pharmacy but is inquiring if he should still be taking it due to the low BP. She plans to pick up his medication from [facility] today.  Please advise Thanks

## 2017-04-01 ENCOUNTER — Telehealth: Payer: Self-pay

## 2017-04-01 MED FILL — glipiZIDE 5 MG TABS: 5 | 30 days supply | Qty: 30 | Fill #0

## 2017-04-01 MED FILL — AMLODIPINE BESYLATE 2.5 MG: 2.5 | 30 days supply | Qty: 30 | Fill #0

## 2017-04-01 NOTE — Telephone Encounter (Signed)
Call placed to [facility], [place] and informed her that as per Dr Jarold Song, the patient is to hold off on the amlodipine and monitor his BP until his visit with her.  [place] stated that she would contact  the [facility] staff to notify the patient.

## 2017-04-01 NOTE — Telephone Encounter (Signed)
Call received from [place], David Wyatt requesting refills of glipiziden and amlodipine that she will pick up for the patient later today.  As per David Wyatt, the medication would be ready for pick up in about an hour.

## 2017-04-01 NOTE — Telephone Encounter (Signed)
He can hold off on his amlodipine and monitor his blood pressure until his visit with me.

## 2017-04-03 ENCOUNTER — Emergency Department (HOSPITAL_COMMUNITY): Payer: Medicaid Other

## 2017-04-03 ENCOUNTER — Other Ambulatory Visit: Payer: Self-pay

## 2017-04-03 ENCOUNTER — Emergency Department (HOSPITAL_COMMUNITY)
Admission: EM | Admit: 2017-04-03 | Discharge: 2017-04-03 | Disposition: A | Discharge status: Home / Self Care | Payer: Medicaid Other | Attending: Emergency Medicine | Admitting: Emergency Medicine

## 2017-04-03 ENCOUNTER — Encounter (HOSPITAL_COMMUNITY): Payer: Self-pay

## 2017-04-03 DIAGNOSIS — R600 Localized edema: Secondary | ICD-10-CM | POA: Diagnosis not present

## 2017-04-03 DIAGNOSIS — R202 Paresthesia of skin: Secondary | ICD-10-CM | POA: Diagnosis not present

## 2017-04-03 DIAGNOSIS — Z79899 Other long term (current) drug therapy: Secondary | ICD-10-CM | POA: Diagnosis not present

## 2017-04-03 DIAGNOSIS — I10 Essential (primary) hypertension: Secondary | ICD-10-CM | POA: Insufficient documentation

## 2017-04-03 DIAGNOSIS — G8929 Other chronic pain: Secondary | ICD-10-CM

## 2017-04-03 DIAGNOSIS — F17210 Nicotine dependence, cigarettes, uncomplicated: Secondary | ICD-10-CM | POA: Diagnosis not present

## 2017-04-03 DIAGNOSIS — Z7984 Long term (current) use of oral hypoglycemic drugs: Secondary | ICD-10-CM | POA: Diagnosis not present

## 2017-04-03 DIAGNOSIS — E1140 Type 2 diabetes mellitus with diabetic neuropathy, unspecified: Secondary | ICD-10-CM | POA: Insufficient documentation

## 2017-04-03 DIAGNOSIS — M545 Low back pain: Secondary | ICD-10-CM | POA: Diagnosis present

## 2017-04-03 LAB — COMPREHENSIVE METABOLIC PANEL
ALBUMIN: 3.4 g/dL — AB (ref 3.5–5.0)
ALK PHOS: 104 U/L (ref 38–126)
ALT: 7 U/L — ABNORMAL LOW (ref 17–63)
AST: 17 U/L (ref 15–41)
Anion gap: 11 (ref 5–15)
BUN: 11 mg/dL (ref 6–20)
CALCIUM: 9.1 mg/dL (ref 8.9–10.3)
CO2: 22 mmol/L (ref 22–32)
Chloride: 105 mmol/L (ref 101–111)
Creatinine, Ser: 1.16 mg/dL (ref 0.61–1.24)
GLUCOSE: 219 mg/dL — AB (ref 65–99)
POTASSIUM: 4.1 mmol/L (ref 3.5–5.1)
Sodium: 138 mmol/L (ref 135–145)
Total Bilirubin: 0.7 mg/dL (ref 0.3–1.2)
Total Protein: 7 g/dL (ref 6.5–8.1)

## 2017-04-03 LAB — DIFFERENTIAL
Basophils Absolute: 0 10*3/uL (ref 0.0–0.1)
Basophils Relative: 1 %
EOS ABS: 0.2 10*3/uL (ref 0.0–0.7)
Eosinophils Relative: 4 %
LYMPHS ABS: 2.6 10*3/uL (ref 0.7–4.0)
LYMPHS PCT: 50 %
MONO ABS: 0.3 10*3/uL (ref 0.1–1.0)
MONOS PCT: 5 %
NEUTROS PCT: 40 %
Neutro Abs: 2.1 10*3/uL (ref 1.7–7.7)

## 2017-04-03 LAB — CBC
HCT: 42.1 % (ref 39.0–52.0)
HEMOGLOBIN: 13.9 g/dL (ref 13.0–17.0)
MCH: 31.4 pg (ref 26.0–34.0)
MCHC: 33 g/dL (ref 30.0–36.0)
MCV: 95 fL (ref 78.0–100.0)
Platelets: 364 10*3/uL (ref 150–400)
RBC: 4.43 MIL/uL (ref 4.22–5.81)
RDW: 14 % (ref 11.5–15.5)
WBC: 5.1 10*3/uL (ref 4.0–10.5)

## 2017-04-03 LAB — TROPONIN I

## 2017-04-03 MED ORDER — LIDOCAINE 5 % EX PTCH
1.0000 | MEDICATED_PATCH | Freq: Once | CUTANEOUS | Status: DC
  Administered 2017-04-03: 1 via TRANSDERMAL
  Filled 2017-04-03: qty 1

## 2017-04-03 MED ORDER — METHOCARBAMOL 500 MG PO TABS: 1000.0000 mg | ORAL_TABLET | Freq: Four times a day (QID) | ORAL | 0 refills | Status: DC | PRN

## 2017-04-03 MED ORDER — METHOCARBAMOL 500 MG PO TABS
500.0000 mg | ORAL_TABLET | Freq: Once | ORAL | Status: AC
  Administered 2017-04-03: 500 mg via ORAL
  Filled 2017-04-03: qty 1

## 2017-04-03 NOTE — Discharge Instructions (Signed)
For pain control you may take up to 800mg of Motrin (also known as ibuprofen). That is usually 4 over the counter pills,  3 times a day. Take with food to minimize stomach irritation  ° °You can also take  tylenol (acetaminophen) 975mg (this is 3 over the counter pills) four times a day. Do not drink alcohol or combine with other medications that have acetaminophen as an ingredient (Read the labels!).   ° °For breakthrough pain you may take Robaxin. Do not drink alcohol, drive or operate heavy machinery when taking Robaxin. ° °Please follow with your primary care doctor in the next 2 days for a check-up. They must obtain records for further management.  ° °Do not hesitate to return to the Emergency Department for any new, worsening or concerning symptoms.  ° °

## 2017-04-03 NOTE — ED Notes (Signed)
Patient is complaining of back pain towards right flank.  Pt states that everything from his head to his right leg tingles (entire right side).  He says when he stands straight, he has pain shooting to his right leg.  Previous lumbar fusion.  He also states that when he coughs, his legs give way and he falls.  Patient is ambulatory and can move all extremities appropriately.

## 2017-04-03 NOTE — ED Triage Notes (Addendum)
Per Pt, Pt is coming from home with complaints of right back pain that started a month ago with reports of hx of pinched nerve and back surgery. Pt complains of right sided tingling to the right face, arm, right thigh and leg x 1 month. Denies Chest pain or SOB. Swelling noted on the right side of the body.

## 2017-04-03 NOTE — ED Provider Notes (Signed)
[facility] EMERGENCY DEPARTMENT Provider Note   CSN: [id number] Arrival date & time: 04/03/17  1217     History   Chief Complaint Chief Complaint  Patient presents with  . Back Pain    HPI   Blood pressure (!) 141/91, pulse 80, temperature 98.5 F (36.9 C), temperature source Oral, resp. rate 16, height 5\' 8"  (1.727 m), weight 79.8 kg (176 lb), SpO2 99 %.  David Wyatt is a 57 y.o. male past medical history significant for hypertension, high cholesterol, type 2 diabetes and cocaine abuse complaining of three-point tenderness in the right low back, severe in nature, no pain medication taken prior to arrival.  The started months ago.  He is also complaining of a painful paresthesia to the entire right side of his body from the head to arms to toes.  He denies any numbness, weakness, chest pain, dysarthria, difficulty swallowing, difficulty with coordination.  He states that when he coughs he feels such a strong pain in his low back that sometimes it makes him fall.  He also notes some mild swelling to the right upper extremity.   Past Medical History:  Diagnosis Date  . Angioedema of lips 07/28/2012   left upper (07/29/2012)  . Arthritis    hands and back  . Chronic back pain   . Cocaine abuse ([place])   . Diabetic neuropathy ([place])   . High cholesterol   . Hypertension   . Neuropathy   . Type II diabetes mellitus [facility])     Patient Active Problem List   Diagnosis Date Noted  . GERD (gastroesophageal reflux disease) 06/13/2015  . Controlled type 2 diabetes mellitus with complication, without long-term current use of insulin ([place]) 05/01/2015  . Diabetic neuropathy ([place]) 05/01/2015  . Essential hypertension, benign 11/01/2013  . Lumbar disc herniation 09/13/2013  . Unspecified constipation 09/13/2013  . Diabetes mellitus due to underlying condition without complications ([place]) 95/28/4132  . Chronic back pain 09/13/2013  . Viral gastroenteritis 05/05/2013  .  Hypoglycemia 05/05/2013  . Felon of finger of left hand with lymphangitis 12/13/2012  . Hyperglycemia 07/29/2012  . Angioedema of lips 07/29/2012  . Crack cocaine use 07/29/2012  . Arthritis 07/29/2012  . Weakness generalized 07/29/2012    Past Surgical History:  Procedure Laterality Date  . BACK SURGERY    . HERNIA REPAIR Right 04/01/2012  . I&D EXTREMITY Left 12/13/2012   Procedure: IRRIGATION AND DEBRIDEMENT LEFT THUMB;  Surgeon: Tennis Must, MD;  Location: [place];  Service: Orthopedics;  Laterality: Left;  . INCISION AND DRAINAGE OF WOUND     boil on back/notes 07/14/2008 (07/29/2012)  . INGUINAL HERNIA REPAIR  04/01/2012   Procedure: HERNIA REPAIR INGUINAL ADULT;  Surgeon: Haywood Lasso, MD;  Location: [place];  Service: General;  Laterality: Right;  . INGUINAL HERNIA REPAIR Left 09/06/2016   Procedure: OPEN REPAIR LEFT INGUINAL HERNIA;  Surgeon: Greer Pickerel, MD;  Location: [place];  Service: General;  Laterality: Left;  . INSERTION OF MESH Left 09/06/2016   Procedure: INSERTION OF MESH;  Surgeon: Greer Pickerel, MD;  Location: [place];  Service: General;  Laterality: Left;  . TONSILLECTOMY         Home Medications    Prior to Admission medications   Medication Sig Start Date End Date Taking? Authorizing Provider  amLODipine (NORVASC) 2.5 MG tablet Take 1 tablet (2.5 mg total) by mouth daily. 02/07/17   [name], MD  atorvastatin (LIPITOR) 40 MG tablet Take 1 tablet (  40 mg total) by mouth daily. 02/07/17   [name], MD  gabapentin (NEURONTIN) 300 MG capsule Take 1 capsule (300 mg total) by mouth 3 (three) times daily. 02/07/17   [name], MD  glipiZIDE (GLUCOTROL) 5 MG tablet Take 1 tablet (5 mg total) by mouth daily before breakfast. 02/08/17   [name], MD  meclizine (ANTIVERT) 12.5 MG tablet Take 1 tablet (12.5 mg total) by mouth 3 (three) times daily as needed for dizziness. 02/02/17   [place], Elmyra Ricks, PA-C  metFORMIN (GLUCOPHAGE) 500 MG tablet Take  2 tablets (1,000 mg total) by mouth 2 (two) times daily with a meal. 02/07/17   [name], MD  methocarbamol (ROBAXIN) 500 MG tablet Take 2 tablets (1,000 mg total) by mouth 4 (four) times daily as needed (Pain). 04/03/17   Nel Stoneking, Elmyra Ricks, PA-C  naproxen (NAPROSYN) 500 MG tablet Take 1 tablet (500 mg total) by mouth 2 (two) times daily. 05/16/16   [name], MD  ondansetron (ZOFRAN ODT) 4 MG disintegrating tablet Take 1 tablet (4 mg total) by mouth every 8 (eight) hours as needed for nausea or vomiting. 06/16/16   Esaw Grandchild, MD    Family History Family History  Problem Relation Age of Onset  . Diabetes Father     Social History Social History   Tobacco Use  . Smoking status: Current Every Day Smoker    Packs/day: 0.25    Years: 30.00    Pack years: 7.50    Types: Cigarettes  . Smokeless tobacco: Former Systems developer    Quit date: 08/15/2015  . Tobacco comment: 4 cigs daily  Substance Use Topics  . Alcohol use: No    Alcohol/week: 0.0 oz  . Drug use: Yes    Types: "Crack" cocaine    Comment: last use 08/23/16     Allergies   Lisinopril   Review of Systems Review of Systems  A complete review of systems was obtained and all systems are negative except as noted in the HPI and PMH.   Physical Exam Updated Vital Signs BP 126/72 (BP Location: Right Arm)   Pulse 67   Temp 98.5 F (36.9 C) (Oral)   Resp 14   Ht 5\' 8"  (1.727 m)   Wt 79.8 kg (176 lb)   SpO2 96%   BMI 26.76 kg/m   Physical Exam  Constitutional: He is oriented to person, place, and time. He appears well-developed and well-nourished. No distress.  HENT:  Head: Normocephalic and atraumatic.  Mouth/Throat: Oropharynx is clear and moist.  Eyes: Conjunctivae and EOM are normal. Pupils are equal, round, and reactive to light.  Neck: Normal range of motion.  Cardiovascular: Normal rate, regular rhythm and intact distal pulses.  Pulmonary/Chest: Effort normal and breath sounds normal.  Abdominal: Soft.  There is no tenderness.  Musculoskeletal: Normal range of motion. He exhibits tenderness.       Back:  Point tender as diagrammed.  No point tenderness to percussion of lumbar spinal processes. Strength is 5 out of 5 to bilateral lower extremities at hip and knee; extensor hallucis longus 5 out of 5. Ankle strength 5 out of 5, no clonus, neurovascularly intact. No saddle anaesthesia. Patellar reflexes are 2+ bilaterally.     Trace swelling to right hand 2+.  No significant tenderness along the arm.  Neurological: He is alert and oriented to person, place, and time.  II-Visual fields grossly intact. III/IV/VI-Extraocular movements intact.  Pupils reactive bilaterally. V/VII-Smile symmetric, equal eyebrow raise,  facial sensation intact VIII-  Hearing grossly intact IX/X-Normal gag XI-bilateral shoulder shrug XII-midline tongue extension Motor: 5/5 bilaterally with normal tone and bulk Cerebellar: Normal finger-to-nose  and normal heel-to-shin test.   Romberg negative Ambulates with a coordinated gait  Skin: He is not diaphoretic.  Psychiatric: He has a normal mood and affect.  Nursing note and vitals reviewed.    ED Treatments / Results  Labs (all labs ordered are listed, but only abnormal results are displayed) Labs Reviewed  COMPREHENSIVE METABOLIC PANEL - Abnormal; Notable for the following components:      Result Value   Glucose, Bld 219 (*)    Albumin 3.4 (*)    ALT 7 (*)    All other components within normal limits  CBC  DIFFERENTIAL  TROPONIN I    EKG  EKG Interpretation  Date/Time:  Thursday April 03 2017 13:25:08 EST Ventricular Rate:  77 PR Interval:  154 QRS Duration: 80 QT Interval:  414 QTC Calculation: 468 R Axis:   18 Text Interpretation:  Normal sinus rhythm Possible Left atrial enlargement Borderline ECG No significant change since last tracing Confirmed by David Wyatt [phone]) on 04/03/2017 3:37:18 PM       Radiology Ct Head Wo  Contrast  Result Date: 04/03/2017 CLINICAL DATA:  Right facial tingling EXAM: CT HEAD WITHOUT CONTRAST TECHNIQUE: Contiguous axial images were obtained from the base of the skull through the vertex without intravenous contrast. COMPARISON:  Head CT 02/02/2017 FINDINGS: Brain: No mass lesion, intraparenchymal hemorrhage or extra-axial collection. No evidence of acute cortical infarct. There is periventricular hypoattenuation compatible with chronic microvascular disease. Vascular: No hyperdense vessel or unexpected calcification. Skull: Normal visualized skull base, calvarium and extracranial soft tissues. Sinuses/Orbits: No sinus fluid levels or advanced mucosal thickening. No mastoid effusion. Normal orbits. IMPRESSION: Chronic microvascular disease without acute intracranial abnormality. Electronically Signed   By: Ulyses Jarred M.D.   On: 04/03/2017 16:41    Procedures Procedures (including critical care time)  Medications Ordered in ED Medications  methocarbamol (ROBAXIN) tablet 500 mg (500 mg Oral Given 04/03/17 1703)     Initial Impression / Assessment and Plan / ED Course  I have reviewed the triage vital signs and the nursing notes.  Pertinent labs & imaging results that were available during my care of the patient were reviewed by me and considered in my medical decision making (see chart for details).     Vitals:   04/03/17 1430 04/03/17 1500 04/03/17 1600 04/03/17 1650  BP: (!) 144/95 (!) 154/81 129/85 126/72  Pulse: 65 62 66 67  Resp: 10 15 11 14   Temp:      TempSrc:      SpO2: 97% 94% 95% 96%  Weight:      Height:        Medications  methocarbamol (ROBAXIN) tablet 500 mg (500 mg Oral Given 04/03/17 1703)    Kinston Magnan is 57 y.o. male presenting with left paraspinal back pain, this appears to be a muscle spasm Lidoderm patches ordered.  She is also reporting a paresthesia which splits down the middle from face to feet on the right side, neurologic exam is otherwise  nonfocal.  Neurology consult from Dr. Malen Gauze appreciated: Agrees that this patient is stable for discharge and follow-up with outpatient neurology.  Denies any chest pain, unclear why triage obtained EKG.  EKG without acute findings however.  Trace edema to right hand, I doubt this is a upper extremity DVT.  Head CT negative.  Ambulatory referral to neurology given.  Advised patient on smoking/drug cessation.  Evaluation does not show pathology that would require ongoing emergent intervention or inpatient treatment. Pt is hemodynamically stable and mentating appropriately. Discussed findings and plan with patient/guardian, who agrees with care plan. All questions answered. Return precautions discussed and outpatient follow up given.      Final Clinical Impressions(s) / ED Diagnoses   Final diagnoses:  Chronic left-sided low back pain without sciatica  Paresthesias    ED Discharge Orders        Ordered    methocarbamol (ROBAXIN) 500 MG tablet  4 times daily PRN     04/03/17 1648    Ambulatory referral to Neurology    Comments:  An appointment is requested in approximately: 4 weeks   04/03/17 1649       Farley Crooker, Charna Elizabeth 04/03/17 2035    Drenda Freeze, MD 04/04/17 [phone]

## 2017-04-03 NOTE — ED Notes (Signed)
Pt wanting sandwich at this time but was told that he needed to wait for test results prior to eating anything.

## 2017-04-04 ENCOUNTER — Encounter: Payer: Self-pay | Admitting: Pediatric Intensive Care

## 2017-04-04 MED FILL — METHOCARBAMOL 500 MG TABS: 500 | 3 days supply | Qty: 20 | Fill #0

## 2017-04-08 ENCOUNTER — Encounter: Payer: Self-pay | Admitting: Pediatric Intensive Care

## 2017-04-08 DIAGNOSIS — E118 Type 2 diabetes mellitus with unspecified complications: Secondary | ICD-10-CM

## 2017-04-08 LAB — GLUCOSE, POCT (MANUAL RESULT ENTRY): POC Glucose: 254 mg/dl — AB (ref 70–99)

## 2017-04-18 ENCOUNTER — Encounter (HOSPITAL_COMMUNITY): Payer: Self-pay | Admitting: Emergency Medicine

## 2017-04-18 ENCOUNTER — Emergency Department (HOSPITAL_COMMUNITY)
Admission: EM | Admit: 2017-04-18 | Discharge: 2017-04-18 | Disposition: A | Discharge status: Home / Self Care | Payer: Medicaid Other | Attending: Emergency Medicine | Admitting: Emergency Medicine

## 2017-04-18 DIAGNOSIS — Z5321 Procedure and treatment not carried out due to patient leaving prior to being seen by health care provider: Secondary | ICD-10-CM | POA: Diagnosis not present

## 2017-04-18 DIAGNOSIS — M545 Low back pain: Secondary | ICD-10-CM | POA: Insufficient documentation

## 2017-04-18 NOTE — ED Notes (Signed)
Per registration patient left AMA and gave back his stickers.

## 2017-04-18 NOTE — ED Triage Notes (Signed)
Per EMS-states lower back pain and needles and pins on right side-has been worked up at [facility] for the same (1/24)-had appointment next month but states his pain has increased-states [facility] at [facility] told him to stop taking his Metformin until he sees a PCP-CBG 223

## 2017-04-21 ENCOUNTER — Encounter: Payer: Self-pay | Admitting: Pediatric Intensive Care

## 2017-04-21 ENCOUNTER — Telehealth: Payer: Self-pay

## 2017-04-21 DIAGNOSIS — E118 Type 2 diabetes mellitus with unspecified complications: Secondary | ICD-10-CM

## 2017-04-21 DIAGNOSIS — R739 Hyperglycemia, unspecified: Secondary | ICD-10-CM

## 2017-04-21 LAB — GLUCOSE, POCT (MANUAL RESULT ENTRY): POC Glucose: 321 mg/dl — AB (ref 70–99)

## 2017-04-21 NOTE — Telephone Encounter (Signed)
Noted.  I will see him at his office visit.

## 2017-04-21 NOTE — Telephone Encounter (Signed)
Call received from [place], [place].  She wanted to report that the patient has not been taking his metformin at least since 04/01/16 when she picked up his medications for him at the pharmacy.  His blood sugar this morning was 321. He has an appointment with Dr Margarita Rana on 04/24/17.

## 2017-04-24 ENCOUNTER — Encounter: Payer: Self-pay | Admitting: Family Medicine

## 2017-04-24 ENCOUNTER — Ambulatory Visit: Payer: Medicaid Other | Attending: Family Medicine | Admitting: Family Medicine

## 2017-04-24 VITALS — BP 146/82 | HR 91 | Temp 98.0°F | Ht 68.0 in | Wt 189.2 lb

## 2017-04-24 DIAGNOSIS — Z9114 Patient's other noncompliance with medication regimen: Secondary | ICD-10-CM | POA: Diagnosis not present

## 2017-04-24 DIAGNOSIS — Z888 Allergy status to other drugs, medicaments and biological substances status: Secondary | ICD-10-CM | POA: Insufficient documentation

## 2017-04-24 DIAGNOSIS — G8929 Other chronic pain: Secondary | ICD-10-CM | POA: Diagnosis not present

## 2017-04-24 DIAGNOSIS — Z79899 Other long term (current) drug therapy: Secondary | ICD-10-CM | POA: Diagnosis not present

## 2017-04-24 DIAGNOSIS — M5441 Lumbago with sciatica, right side: Secondary | ICD-10-CM | POA: Diagnosis not present

## 2017-04-24 DIAGNOSIS — R202 Paresthesia of skin: Secondary | ICD-10-CM | POA: Diagnosis not present

## 2017-04-24 DIAGNOSIS — M5442 Lumbago with sciatica, left side: Secondary | ICD-10-CM | POA: Diagnosis not present

## 2017-04-24 DIAGNOSIS — E118 Type 2 diabetes mellitus with unspecified complications: Secondary | ICD-10-CM

## 2017-04-24 DIAGNOSIS — E1149 Type 2 diabetes mellitus with other diabetic neurological complication: Secondary | ICD-10-CM | POA: Diagnosis not present

## 2017-04-24 DIAGNOSIS — Z7984 Long term (current) use of oral hypoglycemic drugs: Secondary | ICD-10-CM | POA: Insufficient documentation

## 2017-04-24 DIAGNOSIS — E7800 Pure hypercholesterolemia, unspecified: Secondary | ICD-10-CM

## 2017-04-24 DIAGNOSIS — M1990 Unspecified osteoarthritis, unspecified site: Secondary | ICD-10-CM | POA: Diagnosis not present

## 2017-04-24 DIAGNOSIS — E119 Type 2 diabetes mellitus without complications: Secondary | ICD-10-CM | POA: Diagnosis present

## 2017-04-24 DIAGNOSIS — E1140 Type 2 diabetes mellitus with diabetic neuropathy, unspecified: Secondary | ICD-10-CM | POA: Diagnosis not present

## 2017-04-24 DIAGNOSIS — I10 Essential (primary) hypertension: Secondary | ICD-10-CM | POA: Insufficient documentation

## 2017-04-24 DIAGNOSIS — E785 Hyperlipidemia, unspecified: Secondary | ICD-10-CM | POA: Insufficient documentation

## 2017-04-24 LAB — GLUCOSE, POCT (MANUAL RESULT ENTRY): POC Glucose: 195 mg/dl — AB (ref 70–99)

## 2017-04-24 MED ORDER — METHOCARBAMOL 500 MG PO TABS: 1000.0000 mg | ORAL_TABLET | Freq: Two times a day (BID) | ORAL | 1 refills | Status: DC | PRN

## 2017-04-24 MED ORDER — GABAPENTIN 300 MG PO CAPS: 300.0000 mg | ORAL_CAPSULE | Freq: Three times a day (TID) | ORAL | 6 refills | Status: DC

## 2017-04-24 MED ORDER — AMLODIPINE BESYLATE 2.5 MG PO TABS: 2.5000 mg | ORAL_TABLET | Freq: Every day | ORAL | 6 refills | Status: DC

## 2017-04-24 MED ORDER — METFORMIN HCL 500 MG PO TABS: 1000.0000 mg | ORAL_TABLET | Freq: Two times a day (BID) | ORAL | 6 refills | Status: DC

## 2017-04-24 MED ORDER — ATORVASTATIN CALCIUM 40 MG PO TABS: 40.0000 mg | ORAL_TABLET | Freq: Every day | ORAL | 6 refills | Status: DC

## 2017-04-24 MED ORDER — GLIPIZIDE ER 5 MG PO TB24: 5.0000 mg | ORAL_TABLET | Freq: Every day | ORAL | 6 refills | Status: DC

## 2017-04-24 MED FILL — glipiZIDE XL 5 MG TB24: 5 | 30 days supply | Qty: 30 | Fill #0

## 2017-04-24 NOTE — Congregational Nurse Program (Signed)
Congregational Nurse Program Note  Date of Encounter: 03/31/2017  Past Medical History: Past Medical History:  Diagnosis Date  . Angioedema of lips 07/28/2012   left upper (07/29/2012)  . Arthritis    hands and back  . Chronic back pain   . Cocaine abuse ([place])   . Diabetic neuropathy ([place])   . High cholesterol   . Hypertension   . Neuropathy   . Type II diabetes mellitus ([place])     Encounter Details: CNP Questionnaire - 03/31/17 1020      Questionnaire   Patient Status  Not Applicable    Race  Black or African American    Location Patient [place]  Medicaid    Uninsured  Not Applicable    Food  Within past 12 months, worried food would run out with no money to buy more;Yes, have food insecurities    Housing/Utilities  No permanent housing    Transportation  Yes, need transportation assistance    Interpersonal Safety  Yes, feel physically and emotionally safe where you currently live    Medication  Yes, have medication insecurities;Provided medication assistance    Medical Provider  Yes    Referrals  Primary Care Provider/Clinic    ED Visit Averted  Not Applicable    Life-Saving Intervention Made  Not Applicable     Client states he doesn't not have all of his medication but he is connected to care at [facility]. CN will assist with medication pickup.

## 2017-04-24 NOTE — Patient Instructions (Signed)
Paresthesia Paresthesia is a burning or prickling feeling. This feeling can happen in any part of the body. It often happens in the hands, arms, legs, or feet. Usually, it is not painful. In most cases, the feeling goes away in a short time and is not a sign of a serious problem. Follow these instructions at home:  Avoid drinking alcohol.  Try massage or needle therapy (acupuncture) to help with your problems.  Keep all follow-up visits as told by your doctor. This is important. Contact a doctor if:  You keep on having episodes of paresthesia.  Your burning or prickling feeling gets worse when you walk.  You have pain or cramps.  You feel dizzy.  You have a rash. Get help right away if:  You feel weak.  You have trouble walking or moving.  You have problems speaking, understanding, or seeing.  You feel confused.  You cannot control when you pee (urinate) or poop (bowel movement).  You lose feeling (numbness) after an injury.  You pass out (faint). This information is not intended to replace advice given to you by your health care provider. Make sure you discuss any questions you have with your health care provider. Document Released: 02/08/2008 Document Revised: 08/03/2015 Document Reviewed: 02/21/2014 Elsevier Interactive Patient Education  2018 Elsevier Inc.  

## 2017-04-24 NOTE — Progress Notes (Signed)
Subjective:  Patient ID: David Wyatt, male    DOB: [birth year]  Age: 57 y.o. MRN: [id number]  CC: Diabetes   HPI David Wyatt is a 57 year old male with a history of type 2 diabetes mellitus (A1c 7.9), diabetic neuropathy, hypertension, chronic low back pain (status post previous back surgery) who comes into the clinic for follow-up visit.  He complains of right-sided paresthesia which he describes as feeling like "pins and needles" for the last 1 month for which he was seen at the emergency room on 04/03/17.  He states his right side feels heavy and he thinks his right hand is more swollen than the left. He denies headaches, blurry vision, recent falls, urinary incontinence.  Paresthesia is increased by standing and relieved by lying down.  CT head revealed no acute abnormality and as per ED notes neurology consult deemed the patient stable for discharge with follow-up with neurology which comes up on 05/26/17. He has had previous lumbar spine surgery.  He has chronic low back pain which he complains radiates down his right thigh and when he turns he feels like a knot in his thoracolumbar region on the right.  He has not been compliant with his diabetic medications as he quit metformin for some time due to side effects which he had heard about. He has been compliant with his antihypertensive and statin.  Past Medical History:  Diagnosis Date  . Angioedema of lips 07/28/2012   left upper (07/29/2012)  . Arthritis    hands and back  . Chronic back pain   . Cocaine abuse ([place])   . Diabetic neuropathy ([place])   . High cholesterol   . Hypertension   . Neuropathy   . Type II diabetes mellitus ([place])     Past Surgical History:  Procedure Laterality Date  . BACK SURGERY    . HERNIA REPAIR Right 04/01/2012  . I&D EXTREMITY Left 12/13/2012   Procedure: IRRIGATION AND DEBRIDEMENT LEFT THUMB;  Surgeon: Tennis Must, MD;  Location: [place];  Service: Orthopedics;  Laterality: Left;  . INCISION  AND DRAINAGE OF WOUND     boil on back/notes 07/14/2008 (07/29/2012)  . INGUINAL HERNIA REPAIR  04/01/2012   Procedure: HERNIA REPAIR INGUINAL ADULT;  Surgeon: Haywood Lasso, MD;  Location: [place];  Service: General;  Laterality: Right;  . INGUINAL HERNIA REPAIR Left 09/06/2016   Procedure: OPEN REPAIR LEFT INGUINAL HERNIA;  Surgeon: Greer Pickerel, MD;  Location: [place];  Service: General;  Laterality: Left;  . INSERTION OF MESH Left 09/06/2016   Procedure: INSERTION OF MESH;  Surgeon: Greer Pickerel, MD;  Location: [place];  Service: General;  Laterality: Left;  . TONSILLECTOMY      Allergies  Allergen Reactions  . Lisinopril Other (See Comments)    Lip Swelling     Outpatient Medications Prior to Visit  Medication Sig Dispense Refill  . meclizine (ANTIVERT) 12.5 MG tablet Take 1 tablet (12.5 mg total) by mouth 3 (three) times daily as needed for dizziness. 30 tablet 0  . naproxen (NAPROSYN) 500 MG tablet Take 1 tablet (500 mg total) by mouth 2 (two) times daily. 60 tablet 3  . amLODipine (NORVASC) 2.5 MG tablet Take 1 tablet (2.5 mg total) by mouth daily. 30 tablet 6  . atorvastatin (LIPITOR) 40 MG tablet Take 1 tablet (40 mg total) by mouth daily. 30 tablet 6  . gabapentin (NEURONTIN) 300 MG capsule Take 1 capsule (300 mg total) by mouth 3 (three) times daily. [place]  capsule 6  . glipiZIDE (GLUCOTROL) 5 MG tablet Take 1 tablet (5 mg total) by mouth daily before breakfast. 30 tablet 6  . metFORMIN (GLUCOPHAGE) 500 MG tablet Take 2 tablets (1,000 mg total) by mouth 2 (two) times daily with a meal. 120 tablet 6  . methocarbamol (ROBAXIN) 500 MG tablet Take 2 tablets (1,000 mg total) by mouth 4 (four) times daily as needed (Pain). 20 tablet 0  . ondansetron (ZOFRAN ODT) 4 MG disintegrating tablet Take 1 tablet (4 mg total) by mouth every 8 (eight) hours as needed for nausea or vomiting. 20 tablet 0   No facility-administered medications prior to visit.     ROS Review of Systems  Constitutional:  Negative for activity change and appetite change.  HENT: Negative for sinus pressure and sore throat.   Eyes: Negative for visual disturbance.  Respiratory: Negative for cough, chest tightness and shortness of breath.   Cardiovascular: Negative for chest pain and leg swelling.  Gastrointestinal: Negative for abdominal distention, abdominal pain, constipation and diarrhea.  Endocrine: Negative.   Genitourinary: Negative for dysuria.  Musculoskeletal: Positive for back pain. Negative for joint swelling and myalgias.  Skin: Negative for rash.  Allergic/Immunologic: Negative.   Neurological: Positive for numbness. Negative for weakness and light-headedness.  Psychiatric/Behavioral: Negative for dysphoric mood and suicidal ideas.    Objective:  BP (!) 146/82   Pulse 91   Temp 98 F (36.7 C) (Oral)   Ht 5\' 8"  (1.727 m)   Wt 189 lb 3.2 oz (85.8 kg)   SpO2 96%   BMI 28.77 kg/m   BP/Weight 04/24/2017 04/18/2017 6/31/4970  Systolic BP 263 785 885  Diastolic BP 82 027 72  Wt. (Lbs) 189.2 - 176  BMI 28.77 - 26.76      Physical Exam  Constitutional: He is oriented to person, place, and time. He appears well-developed and well-nourished.  Cardiovascular: Normal rate, normal heart sounds and intact distal pulses.  No murmur heard. Pulmonary/Chest: Effort normal and breath sounds normal. He has no wheezes. He has no rales. He exhibits no tenderness.  Abdominal: Soft. Bowel sounds are normal. He exhibits no distension and no mass. There is no tenderness.  Musculoskeletal: Normal range of motion. He exhibits edema (slight right hand edema).  Neurological: He is alert and oriented to person, place, and time.  Dysesthesia on entire right side  Skin: Skin is warm and dry.  Psychiatric: He has a normal mood and affect.     EXAM: CT HEAD WITHOUT CONTRAST  TECHNIQUE: Contiguous axial images were obtained from the base of the skull through the vertex without intravenous  contrast.  COMPARISON:  Head CT 02/02/2017  FINDINGS: Brain: No mass lesion, intraparenchymal hemorrhage or extra-axial collection. No evidence of acute cortical infarct. There is periventricular hypoattenuation compatible with chronic microvascular disease.  Vascular: No hyperdense vessel or unexpected calcification.  Skull: Normal visualized skull base, calvarium and extracranial soft tissues.  Sinuses/Orbits: No sinus fluid levels or advanced mucosal thickening. No mastoid effusion. Normal orbits.  IMPRESSION: Chronic microvascular disease without acute intracranial abnormality.   Electronically Signed   By: Ulyses Jarred M.D.   On: 04/03/2017 16:41  Lab Results  Component Value Date   HGBA1C 7.9 (H) 02/07/2017     Assessment & Plan:   1. Controlled type 2 diabetes mellitus with complication, without long-term current use of insulin (HCC) Uncontrolled with A1c of 7.9 Advised to be more compliant with his medications Counseled on Diabetic diet, my plate method, 741 minutes  of moderate intensity exercise/week Keep blood sugar logs with fasting goals of 80-120 mg/dl, random of less than 180 and in the event of sugars less than 60 mg/dl or greater than 400 mg/dl please notify the clinic ASAP. It is recommended that you undergo annual eye exams and annual foot exams. Pneumonia vaccine is recommended. - POCT glucose (manual entry) - metFORMIN (GLUCOPHAGE) 500 MG tablet; Take 2 tablets (1,000 mg total) by mouth 2 (two) times daily with a meal.  Dispense: 120 tablet; Refill: 6 - atorvastatin (LIPITOR) 40 MG tablet; Take 1 tablet (40 mg total) by mouth daily.  Dispense: 30 tablet; Refill: 6 - glipiZIDE (GLUCOTROL XL) 5 MG 24 hr tablet; Take 1 tablet (5 mg total) by mouth daily with breakfast.  Dispense: 30 tablet; Refill: 6  2. Chronic bilateral low back pain with bilateral sciatica Uncontrolled Status post previous back surgery Advised to apply heat, continue  gabapentin and Robaxin refilled. - methocarbamol (ROBAXIN) 500 MG tablet; Take 2 tablets (1,000 mg total) by mouth 2 (two) times daily as needed (Pain).  Dispense: 80 tablet; Refill: 1 - gabapentin (NEURONTIN) 300 MG capsule; Take 1 capsule (300 mg total) by mouth 3 (three) times daily.  Dispense: 90 capsule; Refill: 6  3. Other diabetic neurological complication associated with type 2 diabetes mellitus (HCC) Stable - gabapentin (NEURONTIN) 300 MG capsule; Take 1 capsule (300 mg total) by mouth 3 (three) times daily.  Dispense: 90 capsule; Refill: 6  4. Essential hypertension, benign Slightly elevated above goal Counseled on blood pressure goal of less than 130/80, low-sodium, DASH diet, medication compliance, 150 minutes of moderate intensity exercise per week. Discussed medication compliance, adverse effects. - amLODipine (NORVASC) 2.5 MG tablet; Take 1 tablet (2.5 mg total) by mouth daily.  Dispense: 30 tablet; Refill: 6  5. Pure hypercholesterolemia Continue statin Low-cholesterol diet  6. Paresthesia Unknown etiology CT head unrevealing He will need EMG and nerve conduction studies Follow-up with neurology   Meds ordered this encounter  Medications  . methocarbamol (ROBAXIN) 500 MG tablet    Sig: Take 2 tablets (1,000 mg total) by mouth 2 (two) times daily as needed (Pain).    Dispense:  80 tablet    Refill:  1  . metFORMIN (GLUCOPHAGE) 500 MG tablet    Sig: Take 2 tablets (1,000 mg total) by mouth 2 (two) times daily with a meal.    Dispense:  120 tablet    Refill:  6  . atorvastatin (LIPITOR) 40 MG tablet    Sig: Take 1 tablet (40 mg total) by mouth daily.    Dispense:  30 tablet    Refill:  6  . gabapentin (NEURONTIN) 300 MG capsule    Sig: Take 1 capsule (300 mg total) by mouth 3 (three) times daily.    Dispense:  90 capsule    Refill:  6  . amLODipine (NORVASC) 2.5 MG tablet    Sig: Take 1 tablet (2.5 mg total) by mouth daily.    Dispense:  30 tablet    Refill:   6  . glipiZIDE (GLUCOTROL XL) 5 MG 24 hr tablet    Sig: Take 1 tablet (5 mg total) by mouth daily with breakfast.    Dispense:  30 tablet    Refill:  6    Follow-up: Return in about 3 months (around 07/22/2017) for Follow-up of chronic medical conditions.   [name] MD

## 2017-04-26 NOTE — Congregational Nurse Program (Signed)
Congregational Nurse Program Note  Date of Encounter: 04/04/2017  Past Medical History: Past Medical History:  Diagnosis Date  . Angioedema of lips 07/28/2012   left upper (07/29/2012)  . Arthritis    hands and back  . Chronic back pain   . Cocaine abuse ([place])   . Diabetic neuropathy ([place])   . High cholesterol   . Hypertension   . Neuropathy   . Type II diabetes mellitus ([place])     Encounter Details: CNP Questionnaire - 04/04/17 1015      Questionnaire   Patient Status  Not Applicable    Race  Black or African American    Location Patient [place]  Medicaid    Uninsured  Not Applicable    Food  Within past 12 months, worried food would run out with no money to buy more;Yes, have food insecurities    Housing/Utilities  No permanent housing    Transportation  Yes, need transportation assistance    Interpersonal Safety  Yes, feel physically and emotionally safe where you currently live    Medication  Yes, have medication insecurities;Provided medication assistance    Medical Provider  Yes    Referrals  Other    ED Visit Averted  Not Applicable    Life-Saving Intervention Made  Not Applicable      Clinical Intake - 04/24/17 1012      Pre-visit preparation   Pre-visit preparation completed  Yes      Pain   Pain   0-10    Pain Score  10-Worst pain ever    Pain Location  Back    Pain Orientation  Lower      Nutrition Screen   Diabetes  Yes    CBG done?  Yes    CBG resulted in Enter/ Edit results?  Yes    Did pt. bring in CBG monitor from home?  No      Functional Status   Activities of Daily Living  Independent    Ambulation  Independent    Medication Administration  Independent    Home Management  Independent      Abuse/Neglect   Do you feel unsafe in your current relationship?  No    Do you feel physically threatened by others?  No    Anyone hurting you at home, work, or school?  No    Unable to ask?  No      Investment banker, operational Needed?  No     BP check. CN wil pick up Robaxin prescription.

## 2017-04-26 NOTE — Congregational Nurse Program (Signed)
Congregational Nurse Program Note  Date of Encounter: 04/08/2017  Past Medical History: Past Medical History:  Diagnosis Date  . Angioedema of lips 07/28/2012   left upper (07/29/2012)  . Arthritis    hands and back  . Chronic back pain   . Cocaine abuse ([place])   . Diabetic neuropathy ([place])   . High cholesterol   . Hypertension   . Neuropathy   . Type II diabetes mellitus ([place])     Encounter Details: CNP Questionnaire - 04/08/17 1100      Questionnaire   Patient Status  Not Applicable    Race  Black or African American    Location Patient Served At  [facility]    Uninsured  Not Applicable    Food  Yes, have food insecurities;Within past 12 months, worried food would run out with no money to buy more    Housing/Utilities  No permanent housing    Transportation  Yes, need transportation assistance    Interpersonal Safety  Yes, feel physically and emotionally safe where you currently live    Medication  Yes, have medication insecurities    Referrals  Primary Care Provider/Clinic    ED Visit Averted  Not Applicable    Life-Saving Intervention Made  Not Applicable      Clinical Intake - 04/24/17 1012      Pre-visit preparation   Pre-visit preparation completed  Yes      Pain   Pain   0-10    Pain Score  10-Worst pain ever    Pain Location  Back    Pain Orientation  Lower      Nutrition Screen   Diabetes  Yes    CBG done?  Yes    CBG resulted in Enter/ Edit results?  Yes    Did pt. bring in CBG monitor from home?  No      Functional Status   Activities of Daily Living  Independent    Ambulation  Independent    Medication Administration  Independent    Home Management  Independent      Abuse/Neglect   Do you feel unsafe in your current relationship?  No    Do you feel physically threatened by others?  No    Anyone hurting you at home, work, or school?  No    Unable to ask?  No      Investment banker, operational Needed?  No     BG/BP  check. SCAT application given

## 2017-04-28 ENCOUNTER — Encounter: Payer: Self-pay | Admitting: Pediatric Intensive Care

## 2017-04-28 DIAGNOSIS — E1140 Type 2 diabetes mellitus with diabetic neuropathy, unspecified: Secondary | ICD-10-CM

## 2017-04-28 LAB — GLUCOSE, POCT (MANUAL RESULT ENTRY): POC Glucose: 139 mg/dl — AB (ref 70–99)

## 2017-04-29 ENCOUNTER — Encounter: Payer: Self-pay | Admitting: Diagnostic Neuroimaging

## 2017-04-29 ENCOUNTER — Ambulatory Visit: Payer: Medicaid Other | Admitting: Diagnostic Neuroimaging

## 2017-04-29 VITALS — BP 131/80 | HR 91 | Ht 68.0 in | Wt 186.0 lb

## 2017-04-29 DIAGNOSIS — R202 Paresthesia of skin: Secondary | ICD-10-CM | POA: Diagnosis not present

## 2017-04-29 DIAGNOSIS — R200 Anesthesia of skin: Secondary | ICD-10-CM | POA: Diagnosis not present

## 2017-04-29 NOTE — Patient Instructions (Signed)
-   check MRI brain, ultrasound of carotids and heart  - continue aspirin, statin, BP control, diabetes control

## 2017-04-29 NOTE — Progress Notes (Signed)
[facility]  PATIENT: David Wyatt DOB: [birth year]  REFERRING CLINICIAN: ER and Margarita Rana, E HISTORY FROM: patient  REASON FOR VISIT: new consult    HISTORICAL  CHIEF COMPLAINT:  Chief Complaint  Patient presents with  . Paresthesia    rm 6, New Pt , "back pain; right side of body tingles, w/needle sensation x 3 months"    HISTORY OF PRESENT ILLNESS:   57 year old male with hypertension, diabetes, hypercholesterolemia, here for evaluation of right face, arm, leg tingling sensation.  Symptoms started approximately 2 months ago.  Symptoms have gradually improved over time.  Now he has needle sensation mainly in his right hand.  He has a pulling sensation in his right hand and wrist.  He has mild sensation in his right face and right leg as well.  Patient was evaluated in the emergency room in November 2018 for dizziness.  He was in emergency room in January 2019 for low back pain and right hemibody painful paresthesia.  He was evaluated with CT of the head which was negative for acute findings.  He was recommended follow-up with PCP and neurology.  Patient has been having trouble with his blood sugars ranging from 200-300 lately.  Patient lives in a shelter and has limited access for healthy food.  Patient has history of lumbar spine surgery in the past.   REVIEW OF SYSTEMS: Full 14 system review of systems performed and negative with exception of: Blurred vision depression.  ALLERGIES: Allergies  Allergen Reactions  . Lisinopril Other (See Comments)    Lip Swelling, angioedema    HOME MEDICATIONS: Outpatient Medications Prior to Visit  Medication Sig Dispense Refill  . amLODipine (NORVASC) 2.5 MG tablet Take 1 tablet (2.5 mg total) by mouth daily. 30 tablet 6  . atorvastatin (LIPITOR) 40 MG tablet Take 1 tablet (40 mg total) by mouth daily. 30 tablet 6  . gabapentin (NEURONTIN) 300 MG capsule Take 1 capsule (300 mg total) by mouth 3 (three) times daily.  90 capsule 6  . glipiZIDE (GLUCOTROL XL) 5 MG 24 hr tablet Take 1 tablet (5 mg total) by mouth daily with breakfast. 30 tablet 6  . meclizine (ANTIVERT) 12.5 MG tablet Take 1 tablet (12.5 mg total) by mouth 3 (three) times daily as needed for dizziness. 30 tablet 0  . metFORMIN (GLUCOPHAGE) 500 MG tablet Take 2 tablets (1,000 mg total) by mouth 2 (two) times daily with a meal. 120 tablet 6  . methocarbamol (ROBAXIN) 500 MG tablet Take 2 tablets (1,000 mg total) by mouth 2 (two) times daily as needed (Pain). 80 tablet 1  . naproxen (NAPROSYN) 500 MG tablet Take 1 tablet (500 mg total) by mouth 2 (two) times daily. 60 tablet 3   No facility-administered medications prior to visit.     PAST MEDICAL HISTORY: Past Medical History:  Diagnosis Date  . Angioedema of lips 07/28/2012   left upper (07/29/2012)  . Arthritis    hands and back  . Chronic back pain   . Cocaine abuse ([place])   . Diabetic neuropathy ([place])   . High cholesterol   . Hypertension   . Neuropathy   . Type II diabetes mellitus ([place])     PAST SURGICAL HISTORY: Past Surgical History:  Procedure Laterality Date  . BACK SURGERY    . HERNIA REPAIR Right 04/01/2012  . I&D EXTREMITY Left 12/13/2012   Procedure: IRRIGATION AND DEBRIDEMENT LEFT THUMB;  Surgeon: Tennis Must, MD;  Location: [facility];  Service: Orthopedics;  Laterality: Left;  . INCISION AND DRAINAGE OF WOUND     boil on back/notes 07/14/2008 (07/29/2012)  . INGUINAL HERNIA REPAIR  04/01/2012   Procedure: HERNIA REPAIR INGUINAL ADULT;  Surgeon: Haywood Lasso, MD;  Location: [facility];  Service: General;  Laterality: Right;  . INGUINAL HERNIA REPAIR Left 09/06/2016   Procedure: OPEN REPAIR LEFT INGUINAL HERNIA;  Surgeon: Greer Pickerel, MD;  Location: [facility];  Service: General;  Laterality: Left;  . INSERTION OF MESH Left 09/06/2016   Procedure: INSERTION OF MESH;  Surgeon: Greer Pickerel, MD;  Location: [facility];  Service: General;  Laterality: Left;  . TONSILLECTOMY       FAMILY HISTORY: Family History  Problem Relation Age of Onset  . Kidney disease Mother   . Diabetes Father     SOCIAL HISTORY:  Social History   Socioeconomic History  . Marital status: Divorced    Spouse name: Not on file  . Number of children: 3  . Years of education: 75  . Highest education level: Not on file  Social Needs  . Financial resource strain: Not on file  . Food insecurity - worry: Not on file  . Food insecurity - inability: Not on file  . Transportation needs - medical: Not on file  . Transportation needs - non-medical: Not on file  Occupational History    Comment: disabled  Tobacco Use  . Smoking status: Current Every Day Smoker    Packs/day: 0.25    Years: 30.00    Pack years: 7.50    Types: Cigarettes  . Smokeless tobacco: Former Systems developer    Quit date: 08/15/2015  . Tobacco comment: 04/29/16 2  cigs daily  Substance and Sexual Activity  . Alcohol use: No    Alcohol/week: 0.0 oz  . Drug use: Yes    Types: "Crack" cocaine    Comment: last use 08/23/16  . Sexual activity: Not on file  Other Topics Concern  . Not on file  Social History Narrative   04/29/17   Lives in shelter   Caffeine- a lot of  tea, coffee     PHYSICAL EXAM  GENERAL EXAM/CONSTITUTIONAL: Vitals:  Vitals:   04/29/17 1317  BP: 131/80  Pulse: 91  Weight: 186 lb (84.4 kg)  Height: 5\' 8"  (1.727 m)     Body mass index is 28.28 kg/m.  Visual Acuity Screening   Right eye Left eye Both eyes  Without correction: 20/40 20/30   With correction:        Patient is in no distress; well developed, nourished and groomed; neck is supple  CARDIOVASCULAR:  Examination of carotid arteries is normal; no carotid bruits  Regular rate and rhythm, no murmurs  Examination of peripheral vascular system by observation and palpation is normal  EYES:  Ophthalmoscopic exam of optic discs and posterior segments is normal; no papilledema or hemorrhages  MUSCULOSKELETAL:  Gait,  strength, tone, movements noted in Neurologic exam below  NEUROLOGIC: MENTAL STATUS:  No flowsheet data found.  awake, alert, oriented to person, place and time  recent and remote memory intact  normal attention and concentration  language fluent, comprehension intact, naming intact,   fund of knowledge appropriate  CRANIAL NERVE:   2nd - no papilledema on fundoscopic exam  2nd, 3rd, 4th, 6th - pupils equal and reactive to light, visual fields full to confrontation, extraocular muscles intact, no nystagmus  5th - facial sensation symmetric  7th - facial strength symmetric  8th - hearing intact  9th - palate elevates symmetrically, uvula midline  11th - shoulder shrug symmetric  12th - tongue protrusion midline  MOTOR:   normal bulk and tone, full strength in the BUE, BLE  SENSORY:   normal and symmetric to light touch, temperature, vibration  COORDINATION:   finger-nose-finger, fine finger movements normal  REFLEXES:   deep tendon reflexes TRACE and symmetric  GAIT/STATION:   narrow based gait    DIAGNOSTIC DATA (LABS, IMAGING, TESTING) - I reviewed patient records, labs, notes, testing and imaging myself where available.  Lab Results  Component Value Date   WBC 5.1 04/03/2017   HGB 13.9 04/03/2017   HCT 42.1 04/03/2017   MCV 95.0 04/03/2017   PLT 364 04/03/2017      Component Value Date/Time   NA 138 04/03/2017 1253   K 4.1 04/03/2017 1253   CL 105 04/03/2017 1253   CO2 22 04/03/2017 1253   GLUCOSE 219 (H) 04/03/2017 1253   BUN 11 04/03/2017 1253   CREATININE 1.16 04/03/2017 1253   CREATININE 1.15 05/22/2016 0852   CALCIUM 9.1 04/03/2017 1253   PROT 7.0 04/03/2017 1253   ALBUMIN 3.4 (L) 04/03/2017 1253   AST 17 04/03/2017 1253   ALT 7 (L) 04/03/2017 1253   ALKPHOS 104 04/03/2017 1253   BILITOT 0.7 04/03/2017 1253   GFRNONAA >60 04/03/2017 1253   GFRNONAA 71 05/22/2016 0852   GFRAA >60 04/03/2017 1253   GFRAA 82 05/22/2016 0852    Lab Results  Component Value Date   CHOL 211 (H) 05/22/2016   HDL 59 05/22/2016   LDLCALC 57 05/03/2015   TRIG 128 05/22/2016   CHOLHDL 3.6 05/22/2016   Lab Results  Component Value Date   HGBA1C 7.9 (H) 02/07/2017   No results found for: VITAMINB12 No results found for: TSH   04/03/17 CT head [I reviewed images myself and agree with interpretation. -VRP]  - Chronic microvascular disease without acute intracranial abnormality.      ASSESSMENT AND PLAN  57 y.o. year old male here with new onset right face, arm, leg tingling painful sensation starting December 2018 and January 2019.  Symptoms localized to left brain subcortical region.  Given patient's medical history I would be concerned for left brain subcortical ischemic infarction, lacunar type.  We will proceed with further workup.   Ddx: right face / arm / leg tingling --> ? Left brain subcortical stroke  1. Right arm and right face tingling   2. Numbness and tingling of right leg      PLAN: - MRI brain  - carotid ultrasound - echocardiogram - continue aspirin 81mg , diabetes treatment, high blood pressure treatment, statin  Orders Placed This Encounter  Procedures  . MR BRAIN WO CONTRAST  . ECHOCARDIOGRAM COMPLETE   Return in about 4 months (around 08/27/2017) for with NP or Gleen Ripberger.    Penni Bombard, MD 4/40/3474, 2:59 PM Certified in Neurology, Neurophysiology and Neuroimaging  [facility] [address] [place], [place] [address] [phone]

## 2017-05-01 ENCOUNTER — Telehealth: Payer: Self-pay | Admitting: Diagnostic Neuroimaging

## 2017-05-01 NOTE — Telephone Encounter (Signed)
Charmain from [facility] called stating PA is needed for Carotid CPT [phone] please make facility [facility]  [place] can be reached at [phone]

## 2017-05-05 ENCOUNTER — Encounter: Payer: Self-pay | Admitting: Pediatric Intensive Care

## 2017-05-05 ENCOUNTER — Telehealth: Payer: Self-pay

## 2017-05-05 DIAGNOSIS — E118 Type 2 diabetes mellitus with unspecified complications: Secondary | ICD-10-CM

## 2017-05-05 LAB — GLUCOSE, POCT (MANUAL RESULT ENTRY): POC Glucose: 109 mg/dl — AB (ref 70–99)

## 2017-05-05 MED FILL — glipiZIDE XL 5 MG TB24: 5 | 30 days supply | Qty: 30 | Fill #1

## 2017-05-05 NOTE — Telephone Encounter (Signed)
Call received from [place], [place] noting that the patient is out of glipizide. Acquanetta Belling [place], [place] notified of the need for a refill. [place] to pick up the prescription later today.

## 2017-05-06 ENCOUNTER — Ambulatory Visit
Admission: RE | Admit: 2017-05-06 | Discharge: 2017-05-06 | Disposition: A | Discharge status: Home / Self Care | Payer: Medicaid Other | Source: Ambulatory Visit | Attending: Diagnostic Neuroimaging | Admitting: Diagnostic Neuroimaging

## 2017-05-06 ENCOUNTER — Encounter: Payer: Self-pay | Admitting: Pediatric Intensive Care

## 2017-05-06 DIAGNOSIS — R202 Paresthesia of skin: Secondary | ICD-10-CM

## 2017-05-06 DIAGNOSIS — R200 Anesthesia of skin: Secondary | ICD-10-CM

## 2017-05-06 NOTE — Telephone Encounter (Signed)
Charmaine with [facility] calling to get PA# for Carotid CPT 93880.

## 2017-05-07 NOTE — Telephone Encounter (Signed)
05-07-17 still in nurse review  Case approved [place] [id number]  05/06/17-06/05/2017 David Wyatt

## 2017-05-07 NOTE — Telephone Encounter (Signed)
Echocardiogram No PA needed reference # [id number]

## 2017-05-07 NOTE — Telephone Encounter (Signed)
PA is still Pending with [employer] . I have talked to [facility] . I am waiting for Denial or approval . I called and checked case its still in review.

## 2017-05-07 NOTE — Telephone Encounter (Signed)
Chastity with [facility] called stating they are needing PA for Echo [phone]. Any questions contact at [phone]

## 2017-05-08 ENCOUNTER — Ambulatory Visit (HOSPITAL_COMMUNITY): Admission: RE | Admit: 2017-05-08 | Payer: Medicaid Other | Source: Ambulatory Visit

## 2017-05-08 ENCOUNTER — Other Ambulatory Visit (HOSPITAL_COMMUNITY): Payer: Medicaid Other

## 2017-05-13 ENCOUNTER — Telehealth: Payer: Self-pay | Admitting: *Deleted

## 2017-05-13 NOTE — Telephone Encounter (Signed)
LMVM for pt to return call [phone]). (may give unremarkable MRI tests results if he calls back).

## 2017-05-13 NOTE — Telephone Encounter (Signed)
-----   Message from Penni Bombard, MD sent at 05/12/2017  5:56 PM EST ----- Unremarkable imaging results. Please call patient. Continue current plan. -VRP

## 2017-05-15 NOTE — Congregational Nurse Program (Signed)
Congregational Nurse Program Note  Date of Encounter: 04/21/2017  Past Medical History: Past Medical History:  Diagnosis Date  . Angioedema of lips 07/28/2012   left upper (07/29/2012)  . Arthritis    hands and back  . Chronic back pain   . Cocaine abuse ([place])   . Diabetic neuropathy ([place])   . High cholesterol   . Hypertension   . Neuropathy   . Type II diabetes mellitus [facility])     Encounter Details: CNP Questionnaire - 04/21/17 0945      Questionnaire   Patient Status  Not Applicable    Race  Black or African American    Location Patient Served At  [employer]    Uninsured  Not Applicable    Food  Yes, have food insecurities;Within past 12 months, worried food would run out with no money to buy more    Housing/Utilities  No permanent housing    Transportation  Yes, need transportation assistance    Interpersonal Safety  Yes, feel physically and emotionally safe where you currently live    Medication  Yes, have medication insecurities    Medical Provider  Yes    Referrals  Not Applicable    ED Visit Averted  Not Applicable    Life-Saving Intervention Made  Not Applicable      Clinical Intake - 04/29/17 1323      Functional Status   Activities of Daily Living  Independent    Ambulation  Independent with device- listed below    [place] Management  Independent     Client states that he was in ED but LWBS. Continues to have right arm numbness and tingling. He has neurology appointment on 3/18. States that he does not have his metformin. States he has an appointment with PCP on Thursday. CN advised speaking with PCP regarding continued arm numbness. Advised making sure to pick up metformin at PCP visit. Advised take robaxin as ordered. Bus passes given. Client agress to plan.

## 2017-05-16 ENCOUNTER — Encounter (HOSPITAL_COMMUNITY): Payer: Self-pay | Admitting: Diagnostic Neuroimaging

## 2017-05-16 ENCOUNTER — Ambulatory Visit (HOSPITAL_BASED_OUTPATIENT_CLINIC_OR_DEPARTMENT_OTHER)
Admission: RE | Admit: 2017-05-16 | Discharge: 2017-05-16 | Disposition: A | Discharge status: Home / Self Care | Payer: Medicaid Other | Source: Ambulatory Visit | Attending: Diagnostic Neuroimaging | Admitting: Diagnostic Neuroimaging

## 2017-05-16 ENCOUNTER — Ambulatory Visit (HOSPITAL_COMMUNITY)
Admission: RE | Admit: 2017-05-16 | Discharge: 2017-05-16 | Disposition: A | Discharge status: Home / Self Care | Payer: Medicaid Other | Source: Ambulatory Visit | Attending: Diagnostic Neuroimaging | Admitting: Diagnostic Neuroimaging

## 2017-05-16 DIAGNOSIS — I119 Hypertensive heart disease without heart failure: Secondary | ICD-10-CM | POA: Insufficient documentation

## 2017-05-16 DIAGNOSIS — R202 Paresthesia of skin: Secondary | ICD-10-CM

## 2017-05-16 DIAGNOSIS — R200 Anesthesia of skin: Secondary | ICD-10-CM | POA: Diagnosis not present

## 2017-05-16 DIAGNOSIS — E119 Type 2 diabetes mellitus without complications: Secondary | ICD-10-CM | POA: Insufficient documentation

## 2017-05-16 NOTE — Progress Notes (Signed)
Bilateral carotid duplex completed. There is no evidence of a significant ICA stenosis. Vertebral artery flow is antegrade. [facility], [place]  05/16/2017 11:55 AM

## 2017-05-16 NOTE — Progress Notes (Signed)
  Echocardiogram 2D Echocardiogram has been performed.  David Wyatt 05/16/2017, 10:49 AM

## 2017-05-16 NOTE — Telephone Encounter (Signed)
-----   Message from Penni Bombard, MD sent at 05/12/2017  5:56 PM EST ----- Unremarkable imaging results. Please call patient. Continue current plan. -VRP

## 2017-05-16 NOTE — Telephone Encounter (Signed)
LMVM mobile that MRI results were unremarkable, to continue current plan.   Pt to call back as needed.

## 2017-05-17 NOTE — Congregational Nurse Program (Signed)
Congregational Nurse Program Note  Date of Encounter: 04/28/2017  Past Medical History: Past Medical History:  Diagnosis Date  . Angioedema of lips 07/28/2012   left upper (07/29/2012)  . Arthritis    hands and back  . Chronic back pain   . Cocaine abuse ([place])   . Diabetic neuropathy ([place])   . High cholesterol   . Hypertension   . Neuropathy   . Type II diabetes mellitus ([place])     Encounter Details: CNP Questionnaire - 04/28/17 0930      Questionnaire   Patient Status  Not Applicable    Race  Black or African American    Location Patient [place]  Medicaid    Uninsured  Not Applicable    Food  Yes, have food insecurities;Within past 12 months, worried food would run out with no money to buy more    Housing/Utilities  No permanent housing    Transportation  Yes, need transportation assistance    Interpersonal Safety  Yes, feel physically and emotionally safe where you currently live    Medication  No medication insecurities    Medical Provider  Yes    Referrals  Not Applicable    ED Visit Averted  Not Applicable    Life-Saving Intervention Made  Not Applicable      Clinical Intake - 04/29/17 1323      Functional Status   Activities of Daily Living  Independent    Ambulation  Independent with device- listed below    [place] Management  Independent     Client has upcoming neuro appointment. States he re-started metformin 5 days ago and has been mindful about serving sizes. BG/BP check.

## 2017-05-17 NOTE — Congregational Nurse Program (Signed)
Congregational Nurse Program Note  Date of Encounter: 05/05/2017  Past Medical History: Past Medical History:  Diagnosis Date  . Angioedema of lips 07/28/2012   left upper (07/29/2012)  . Arthritis    hands and back  . Chronic back pain   . Cocaine abuse ([place])   . Diabetic neuropathy ([place])   . High cholesterol   . Hypertension   . Neuropathy   . Type II diabetes mellitus ([place])     Encounter Details: CNP Questionnaire - 05/05/17 1000      Questionnaire   Patient Status  Not Applicable    Race  Black or African American    Location Patient [place]  Medicaid    Uninsured  Not Applicable    Food  Within past 12 months, worried food would run out with no money to buy more;Yes, have food insecurities    Housing/Utilities  No permanent housing    Transportation  Yes, need transportation assistance;Provided transportation assistance (bus pass, taxi voucher, etc.)    Interpersonal Safety  Yes, feel physically and emotionally safe where you currently live    Medication  Yes, have medication insecurities;Provided medication assistance    Medical Provider  Yes    Referrals  Other    ED Visit Averted  Not Applicable    Life-Saving Intervention Made  Not Applicable      Clinical Intake - 04/29/17 1323      Functional Status   Activities of Daily Living  Independent    Ambulation  Independent with device- listed below    [place] Management  Independent     BG/BP check. Needs bus passes for MRI. States he is out of glipizide. CN will check on refills at [place].

## 2017-05-18 NOTE — Congregational Nurse Program (Signed)
Congregational Nurse Program Note  Date of Encounter: 05/06/2017  Past Medical History: Past Medical History:  Diagnosis Date  . Angioedema of lips 07/28/2012   left upper (07/29/2012)  . Arthritis    hands and back  . Chronic back pain   . Cocaine abuse ([place])   . Diabetic neuropathy ([place])   . High cholesterol   . Hypertension   . Neuropathy   . Type II diabetes mellitus ([place])     Encounter Details: CNP Questionnaire - 05/06/17 0930      Questionnaire   Patient Status  Not Applicable    Race  Black or African American    Location Patient [place]  Medicaid    Uninsured  Not Applicable    Food  Within past 12 months, worried food would run out with no money to buy more;Yes, have food insecurities    Housing/Utilities  No permanent housing    Transportation  Yes, need transportation assistance;Provided transportation assistance (bus pass, taxi voucher, etc.)    Interpersonal Safety  Yes, feel physically and emotionally safe where you currently live    Medication  Yes, have medication insecurities;Provided medication assistance    Medical Provider  Yes    Referrals  Medication Assistance    ED Visit Averted  Not Applicable    Life-Saving Intervention Made  Not Applicable      Clinical Intake - 04/29/17 1323      Functional Status   Activities of Daily Living  Independent    Ambulation  Independent with device- listed below    [place] Management  Independent     Hand off medication from [facility]. Bus passes for MRI today.

## 2017-05-19 ENCOUNTER — Encounter: Payer: Self-pay | Admitting: Pediatric Intensive Care

## 2017-05-19 LAB — POCT CBG (FASTING - GLUCOSE)-MANUAL ENTRY: Glucose Fasting, POC: 383 mg/dL — AB (ref 70–99)

## 2017-05-19 NOTE — Congregational Nurse Program (Signed)
Congregational Nurse Program Note  Date of Encounter: 05/19/2017  Past Medical History: Past Medical History:  Diagnosis Date  . Angioedema of lips 07/28/2012   left upper (07/29/2012)  . Arthritis    hands and back  . Chronic back pain   . Cocaine abuse ([place])   . Diabetic neuropathy ([place])   . High cholesterol   . Hypertension   . Neuropathy   . Type II diabetes mellitus ([place])     Encounter Details: CNP Questionnaire - 05/19/17 1015      Questionnaire   Patient Status  Not Applicable    Race  Black or African American    Location Patient Served At  [employer]  Within past 12 months, worried food would run out with no money to buy more;Yes, have food insecurities    Housing/Utilities  No permanent housing    Transportation  Yes, need transportation assistance    Interpersonal Safety  Yes, feel physically and emotionally safe where you currently live    Medication  Yes, have medication insecurities    Medical Provider  Yes    Referrals  Not Applicable    ED Visit Averted  Not Applicable    Life-Saving Intervention Made  Not Applicable      Clinical Intake - 04/29/17 1323      Functional Status   Activities of Daily Living  Independent    Ambulation  Independent with device- listed below    [place]     BP/BG check. Client states he has not taken metformin or glipizide in 3 days as he ran out of medication. He has been mindful about serving sizes in cafeteria as he is aware that the meals are starch-heavy. Client states that he is going to [facility] tomorrow to pick up medication. CN will provide bus passes.

## 2017-05-19 NOTE — Congregational Nurse Program (Signed)
Congregational Nurse Program Note  Date of Encounter: 04/21/2017  Past Medical History: Past Medical History:  Diagnosis Date  . Angioedema of lips 07/28/2012   left upper (07/29/2012)  . Arthritis    hands and back  . Chronic back pain   . Cocaine abuse ([place])   . Diabetic neuropathy ([place])   . High cholesterol   . Hypertension   . Neuropathy   . Type II diabetes mellitus ([place])     Encounter Details: CNP Questionnaire - 05/06/17 0930      Questionnaire   Patient Status  Not Applicable    Race  Black or African American    Location Patient [place]  Medicaid    Uninsured  Not Applicable    Food  Within past 12 months, worried food would run out with no money to buy more;Yes, have food insecurities    Housing/Utilities  No permanent housing    Transportation  Yes, need transportation assistance;Provided transportation assistance (bus pass, taxi voucher, etc.)    Interpersonal Safety  Yes, feel physically and emotionally safe where you currently live    Medication  Yes, have medication insecurities;Provided medication assistance    Medical Provider  Yes    Referrals  Medication Assistance    ED Visit Averted  Not Applicable    Life-Saving Intervention Made  Not Applicable      Clinical Intake - 04/29/17 1323      Functional Status   Activities of Daily Living  Independent    Ambulation  Independent with device- listed below    Home Assistive Devices/Equipment  Cane    Medication Administration  Independent    Home Management  Independent     Requested CBG.  Non- fasting 383.  Will see provider tomorrow

## 2017-05-20 ENCOUNTER — Telehealth: Payer: Self-pay | Admitting: Diagnostic Neuroimaging

## 2017-05-20 NOTE — Telephone Encounter (Signed)
Spoke with pt and gave him the results of his echo and carotid doppler (unremarkable, no major findings).  He wanted to know what the next step was.  He was still c/o of numbness tingling (but also back pain with sharp pains going down both legs when he made a step).  You did MRI brain w/no acute findings.  He stated that he has not taken his meds for several days he needs to pick up his meds.  Please advise.

## 2017-05-20 NOTE — Telephone Encounter (Signed)
Patient calling stating he has had all his tests done and now wants to know the next step.

## 2017-05-20 NOTE — Telephone Encounter (Signed)
He should follow up with PCP for diabetes treatment. No further recs from neuro standpoint. -VRP

## 2017-05-20 NOTE — Telephone Encounter (Signed)
LMVM for pt that per Dr. Leta Baptist to f/u with pcp for diabetes treatment, no further recommendations from neuro standpoint.  Pt is to call back if questions.

## 2017-05-26 ENCOUNTER — Ambulatory Visit: Payer: Self-pay | Admitting: Neurology

## 2017-05-28 MED FILL — METHOCARBAMOL 500 MG TABLET: 500 | 20 days supply | Qty: 80 | Fill #0

## 2017-05-28 MED FILL — GABAPENTIN 300 MG CAPSULE: 300 | 30 days supply | Qty: 90 | Fill #0

## 2017-05-28 MED FILL — ATORVASTATIN 40 MG TABLET: 40 | 30 days supply | Qty: 30 | Fill #0

## 2017-05-28 MED FILL — metFORMIN HCL 500 MG TABS: 500 | 30 days supply | Qty: 120 | Fill #0

## 2017-05-28 MED FILL — AMLODIPINE BESYLATE 2.5 MG: 2.5 | 30 days supply | Qty: 30 | Fill #0

## 2017-06-03 ENCOUNTER — Encounter (HOSPITAL_COMMUNITY): Payer: Self-pay

## 2017-06-03 ENCOUNTER — Emergency Department (HOSPITAL_COMMUNITY)
Admission: EM | Admit: 2017-06-03 | Discharge: 2017-06-03 | Disposition: A | Discharge status: Home / Self Care | Payer: Medicaid Other | Attending: Emergency Medicine | Admitting: Emergency Medicine

## 2017-06-03 ENCOUNTER — Other Ambulatory Visit: Payer: Self-pay

## 2017-06-03 DIAGNOSIS — G8929 Other chronic pain: Secondary | ICD-10-CM | POA: Diagnosis not present

## 2017-06-03 DIAGNOSIS — M545 Low back pain: Secondary | ICD-10-CM | POA: Insufficient documentation

## 2017-06-03 DIAGNOSIS — I10 Essential (primary) hypertension: Secondary | ICD-10-CM | POA: Diagnosis not present

## 2017-06-03 DIAGNOSIS — H81391 Other peripheral vertigo, right ear: Secondary | ICD-10-CM

## 2017-06-03 DIAGNOSIS — E119 Type 2 diabetes mellitus without complications: Secondary | ICD-10-CM | POA: Diagnosis not present

## 2017-06-03 DIAGNOSIS — F17210 Nicotine dependence, cigarettes, uncomplicated: Secondary | ICD-10-CM | POA: Insufficient documentation

## 2017-06-03 DIAGNOSIS — Z79899 Other long term (current) drug therapy: Secondary | ICD-10-CM | POA: Insufficient documentation

## 2017-06-03 DIAGNOSIS — R42 Dizziness and giddiness: Secondary | ICD-10-CM | POA: Diagnosis present

## 2017-06-03 DIAGNOSIS — J0100 Acute maxillary sinusitis, unspecified: Secondary | ICD-10-CM | POA: Insufficient documentation

## 2017-06-03 DIAGNOSIS — J320 Chronic maxillary sinusitis: Secondary | ICD-10-CM

## 2017-06-03 LAB — CBC WITH DIFFERENTIAL/PLATELET
BASOS PCT: 0 %
Basophils Absolute: 0 10*3/uL (ref 0.0–0.1)
EOS ABS: 0.4 10*3/uL (ref 0.0–0.7)
EOS PCT: 7 %
HCT: 41.1 % (ref 39.0–52.0)
Hemoglobin: 13.5 g/dL (ref 13.0–17.0)
LYMPHS ABS: 1.9 10*3/uL (ref 0.7–4.0)
Lymphocytes Relative: 36 %
MCH: 31.2 pg (ref 26.0–34.0)
MCHC: 32.8 g/dL (ref 30.0–36.0)
MCV: 94.9 fL (ref 78.0–100.0)
MONOS PCT: 8 %
Monocytes Absolute: 0.4 10*3/uL (ref 0.1–1.0)
Neutro Abs: 2.6 10*3/uL (ref 1.7–7.7)
Neutrophils Relative %: 49 %
PLATELETS: 336 10*3/uL (ref 150–400)
RBC: 4.33 MIL/uL (ref 4.22–5.81)
RDW: 13.8 % (ref 11.5–15.5)
WBC: 5.3 10*3/uL (ref 4.0–10.5)

## 2017-06-03 LAB — COMPREHENSIVE METABOLIC PANEL
ALK PHOS: 84 U/L (ref 38–126)
ALT: 12 U/L — ABNORMAL LOW (ref 17–63)
AST: 21 U/L (ref 15–41)
Albumin: 3.5 g/dL (ref 3.5–5.0)
Anion gap: 7 (ref 5–15)
BUN: 13 mg/dL (ref 6–20)
CALCIUM: 8.8 mg/dL — AB (ref 8.9–10.3)
CHLORIDE: 109 mmol/L (ref 101–111)
CO2: 24 mmol/L (ref 22–32)
Creatinine, Ser: 1.01 mg/dL (ref 0.61–1.24)
GFR calc non Af Amer: >60 mL/min (ref >60)
Glucose, Bld: 135 mg/dL — ABNORMAL HIGH (ref 65–99)
Potassium: 4.2 mmol/L (ref 3.5–5.1)
SODIUM: 140 mmol/L (ref 135–145)
Total Bilirubin: 0.6 mg/dL (ref 0.3–1.2)
Total Protein: 7 g/dL (ref 6.5–8.1)

## 2017-06-03 MED ORDER — LORATADINE 10 MG PO TABS: 10.0000 mg | ORAL_TABLET | Freq: Every day | ORAL | 0 refills | Status: DC

## 2017-06-03 MED ORDER — MECLIZINE HCL 25 MG PO TABS
25.0000 mg | ORAL_TABLET | Freq: Once | ORAL | Status: AC
  Administered 2017-06-03: 25 mg via ORAL
  Filled 2017-06-03: qty 1

## 2017-06-03 MED ORDER — FLUTICASONE PROPIONATE 50 MCG/ACT NA SUSP: 2.0000 | Freq: Every day | NASAL | 0 refills | Status: DC

## 2017-06-03 MED ORDER — SODIUM CHLORIDE 0.9 % IV BOLUS
500.0000 mL | Freq: Once | INTRAVENOUS | Status: AC
  Administered 2017-06-03: 500 mL via INTRAVENOUS

## 2017-06-03 MED ORDER — MECLIZINE HCL 25 MG PO TABS: 25.0000 mg | ORAL_TABLET | Freq: Three times a day (TID) | ORAL | 0 refills | Status: DC | PRN

## 2017-06-03 NOTE — ED Provider Notes (Signed)
[facility] DEPT Provider Note   CSN: [id number] Arrival date & time: 06/03/17  0759     History   Chief Complaint Chief Complaint  Patient presents with  . Near Syncope  . Back Pain    HPI David Wyatt is a 57 y.o. male.  HPI Patient states he was getting up out of bed, became dizzy and fell back into the bed.  Denies hitting his head.  Denies loss of consciousness.  States he has chronic low back pain. This is unchanged.  Patient has chronic right-sided paresthesias for which he is being worked up by neurology.  This is unchanged.  Recent MRI with no acute findings.  Patient states dizziness is worse with position change.  Denies any nausea or vomiting.  Denies focal weakness.  Denies urinary incontinence. Past Medical History:  Diagnosis Date  . Angioedema of lips 07/28/2012   left upper (07/29/2012)  . Arthritis    hands and back  . Chronic back pain   . Cocaine abuse ([place])   . Diabetic neuropathy ([place])   . High cholesterol   . Hypertension   . Neuropathy   . Type II diabetes mellitus [facility])     Patient Active Problem List   Diagnosis Date Noted  . Hyperlipidemia 04/24/2017  . GERD (gastroesophageal reflux disease) 06/13/2015  . Controlled type 2 diabetes mellitus with complication, without long-term current use of insulin ([place]) 05/01/2015  . Diabetic neuropathy ([place]) 05/01/2015  . Essential hypertension, benign 11/01/2013  . Lumbar disc herniation 09/13/2013  . Unspecified constipation 09/13/2013  . Diabetes mellitus due to underlying condition without complications ([place]) 71/08/2692  . Chronic back pain 09/13/2013  . Viral gastroenteritis 05/05/2013  . Hypoglycemia 05/05/2013  . Felon of finger of left hand with lymphangitis 12/13/2012  . Hyperglycemia 07/29/2012  . Angioedema of lips 07/29/2012  . Crack cocaine use 07/29/2012  . Arthritis 07/29/2012  . Weakness generalized 07/29/2012    Past Surgical History:  Procedure  Laterality Date  . BACK SURGERY    . HERNIA REPAIR Right 04/01/2012  . I&D EXTREMITY Left 12/13/2012   Procedure: IRRIGATION AND DEBRIDEMENT LEFT THUMB;  Surgeon: Tennis Must, MD;  Location: [facility];  Service: Orthopedics;  Laterality: Left;  . INCISION AND DRAINAGE OF WOUND     boil on back/notes 07/14/2008 (07/29/2012)  . INGUINAL HERNIA REPAIR  04/01/2012   Procedure: HERNIA REPAIR INGUINAL ADULT;  Surgeon: Haywood Lasso, MD;  Location: [facility];  Service: General;  Laterality: Right;  . INGUINAL HERNIA REPAIR Left 09/06/2016   Procedure: OPEN REPAIR LEFT INGUINAL HERNIA;  Surgeon: Greer Pickerel, MD;  Location: [facility];  Service: General;  Laterality: Left;  . INSERTION OF MESH Left 09/06/2016   Procedure: INSERTION OF MESH;  Surgeon: Greer Pickerel, MD;  Location: [facility];  Service: General;  Laterality: Left;  . TONSILLECTOMY          Home Medications    Prior to Admission medications   Medication Sig Start Date End Date Taking? Authorizing Provider  amLODipine (NORVASC) 2.5 MG tablet Take 1 tablet (2.5 mg total) by mouth daily. 04/24/17  Yes [name], MD  atorvastatin (LIPITOR) 40 MG tablet Take 1 tablet (40 mg total) by mouth daily. 04/24/17  Yes [name], MD  gabapentin (NEURONTIN) 300 MG capsule Take 1 capsule (300 mg total) by mouth 3 (three) times daily. 04/24/17  Yes Newlin, Charlane Ferretti, MD  glipiZIDE (GLUCOTROL XL) 5 MG 24 hr tablet Take 1 tablet (5  mg total) by mouth daily with breakfast. 04/24/17  Yes [name], MD  metFORMIN (GLUCOPHAGE) 500 MG tablet Take 2 tablets (1,000 mg total) by mouth 2 (two) times daily with a meal. 04/24/17  Yes Newlin, Enobong, MD  methocarbamol (ROBAXIN) 500 MG tablet Take 2 tablets (1,000 mg total) by mouth 2 (two) times daily as needed (Pain). 04/24/17  Yes [name], MD  naproxen (NAPROSYN) 500 MG tablet Take 1 tablet (500 mg total) by mouth 2 (two) times daily. 05/16/16  Yes Newlin, Charlane Ferretti, MD  fluticasone (FLONASE) 50 MCG/ACT nasal  spray Place 2 sprays into both nostrils daily. 06/03/17   [name], MD  loratadine (CLARITIN) 10 MG tablet Take 1 tablet (10 mg total) by mouth daily. 06/03/17   [name], MD  meclizine (ANTIVERT) 25 MG tablet Take 1 tablet (25 mg total) by mouth 3 (three) times daily as needed for dizziness. 06/03/17   [name], MD    Family History Family History  Problem Relation Age of Onset  . Kidney disease Mother   . Diabetes Father     Social History Social History   Tobacco Use  . Smoking status: Current Every Day Smoker    Packs/day: 0.25    Years: 30.00    Pack years: 7.50    Types: Cigarettes  . Smokeless tobacco: Former Systems developer    Quit date: 08/15/2015  . Tobacco comment: 04/29/16 2  cigs daily  Substance Use Topics  . Alcohol use: No    Alcohol/week: 0.0 oz  . Drug use: Yes    Types: "Crack" cocaine    Comment: last use 08/23/16     Allergies   Lisinopril   Review of Systems Review of Systems  Constitutional: Negative for chills and fever.  HENT: Positive for tinnitus. Negative for sore throat and trouble swallowing.   Eyes: Negative for visual disturbance.  Respiratory: Negative for cough and shortness of breath.   Cardiovascular: Negative for chest pain, palpitations and leg swelling.  Gastrointestinal: Negative for abdominal pain, diarrhea, nausea and vomiting.  Genitourinary: Negative for dysuria, flank pain, frequency and hematuria.  Musculoskeletal: Negative for arthralgias, back pain, myalgias, neck pain and neck stiffness.  Skin: Negative for rash and wound.  Neurological: Positive for dizziness. Negative for syncope, speech difficulty, weakness, light-headedness, numbness and headaches.  All other systems reviewed and are negative.    Physical Exam Updated Vital Signs BP (!) 143/111   Pulse (!) 56   Temp 98.4 F (36.9 C)   Resp 15   Ht 5\' 8"  (1.727 m)   Wt 84.4 kg (186 lb)   SpO2 98%   BMI 28.28 kg/m   Physical Exam    Constitutional: He is oriented to person, place, and time. He appears well-developed and well-nourished. No distress.  Patient is resting and in no distress.  HENT:  Head: Normocephalic and atraumatic.  Mouth/Throat: Oropharynx is clear and moist.  Patient with right maxillary sinus tenderness to percussion.  Eyes: Pupils are equal, round, and reactive to light. Conjunctivae and EOM are normal.  Fatigable horizontal nystagmus.  Neck: Normal range of motion. Neck supple.  Cardiovascular: Normal rate and regular rhythm. Exam reveals no gallop and no friction rub.  No murmur heard. Pulmonary/Chest: Effort normal and breath sounds normal. No stridor. No respiratory distress. He has no wheezes. He has no rales. He exhibits no tenderness.  Abdominal: Soft. Bowel sounds are normal. There is no tenderness. There is no rebound and no guarding.  Musculoskeletal: Normal range  of motion. He exhibits no edema or tenderness.  Patient has a right sided lumbar paraspinal muscular tenderness to palpation.  No midline thoracic or lumbar tenderness.  No step-offs.  No lower extremity swelling or asymmetry.  Distal pulses are 2+.  Neurological: He is alert and oriented to person, place, and time.  Moves all extremities without focal deficit.  Sensation fully intact.  To light touch intact.  Sensation to light touch intact.  No saddle anesthesia.  Skin: Skin is warm and dry. Capillary refill takes less than 2 seconds. No rash noted. He is not diaphoretic. No erythema.  Psychiatric: He has a normal mood and affect. His behavior is normal.  Nursing note and vitals reviewed.    ED Treatments / Results  Labs (all labs ordered are listed, but only abnormal results are displayed) Labs Reviewed  COMPREHENSIVE METABOLIC PANEL - Abnormal; Notable for the following components:      Result Value   Glucose, Bld 135 (*)    Calcium 8.8 (*)    ALT 12 (*)    All other components within normal limits  CBC WITH  DIFFERENTIAL/PLATELET    EKG EKG Interpretation  Date/Time:  Tuesday June 03 2017 08:58:47 EDT Ventricular Rate:  60 PR Interval:    QRS Duration: 90 QT Interval:  451 QTC Calculation: 451 R Axis:   31 Text Interpretation:  Sinus rhythm Probable left atrial enlargement Borderline ST elevation, anterolateral leads Confirmed by [name] [phone]) on 06/03/2017 11:16:38 AM   Radiology No results found.  Procedures Procedures (including critical care time)  Medications Ordered in ED Medications  meclizine (ANTIVERT) tablet 25 mg (25 mg Oral Given 06/03/17 0854)  sodium chloride 0.9 % bolus 500 mL (500 mLs Intravenous Given 06/03/17 0854)     Initial Impression / Assessment and Plan / ED Course  I have reviewed the triage vital signs and the nursing notes.  Pertinent labs & imaging results that were available during my care of the patient were reviewed by me and considered in my medical decision making (see chart for details).     Dizziness is improved with medication and fluids.  Ambulating with cane.  Nonfocal neurologic exam.  Suspect sinus disease related to peripheral vertigo.  Will start on Flonase and Claritin in addition to meclizine as needed for dizziness.  Return precautions given.  Final Clinical Impressions(s) / ED Diagnoses   Final diagnoses:  Peripheral vertigo involving right ear  Right maxillary sinusitis  Chronic right-sided low back pain without sciatica    ED Discharge Orders        Ordered    meclizine (ANTIVERT) 25 MG tablet  3 times daily PRN     06/03/17 1113    loratadine (CLARITIN) 10 MG tablet  Daily     06/03/17 1113    fluticasone (FLONASE) 50 MCG/ACT nasal spray  Daily     06/03/17 1113       [name], MD 06/03/17 1117

## 2017-06-03 NOTE — ED Notes (Signed)
RN went in to discharge pt. Pt was very rude to Therapist, sports. Pt yelled at RN because there were no bus passes to give out. Pt stated "Then how the hell am I supposed to get home?" RN replied that she was sorry but she was not sure.  Pt was angry and said that "I was served cold breakfast and only given cold damn food. Give me some hot food now." Pt was given a sandwich and a cheese stick before discharge.

## 2017-06-03 NOTE — ED Triage Notes (Signed)
EMS reports from [facility], call for near syncope, Pt lost balance with dizziness and fell back onto bed. Hx of back surgery with chronic back pain.   BP 153/112 HR 92 Resp 16 Spo2 97 RA CBG 124  20ga R forearm

## 2017-06-03 NOTE — ED Notes (Signed)
Bed: WA09 Expected date: 06/03/17 Expected time: 7:59 AM Means of arrival: Ambulance Comments: EMS-syncope

## 2017-06-03 NOTE — ED Notes (Signed)
Pt ambulated with cane assistance to bathroom and back

## 2017-06-05 ENCOUNTER — Encounter: Payer: Self-pay | Admitting: Family Medicine

## 2017-06-05 ENCOUNTER — Ambulatory Visit: Payer: Medicaid Other | Attending: Family Medicine | Admitting: Family Medicine

## 2017-06-05 VITALS — BP 154/91 | HR 71 | Temp 97.8°F | Ht 68.0 in | Wt 180.0 lb

## 2017-06-05 DIAGNOSIS — E7800 Pure hypercholesterolemia, unspecified: Secondary | ICD-10-CM | POA: Insufficient documentation

## 2017-06-05 DIAGNOSIS — M5126 Other intervertebral disc displacement, lumbar region: Secondary | ICD-10-CM | POA: Diagnosis not present

## 2017-06-05 DIAGNOSIS — Z7984 Long term (current) use of oral hypoglycemic drugs: Secondary | ICD-10-CM | POA: Insufficient documentation

## 2017-06-05 DIAGNOSIS — G8929 Other chronic pain: Secondary | ICD-10-CM | POA: Insufficient documentation

## 2017-06-05 DIAGNOSIS — M5442 Lumbago with sciatica, left side: Secondary | ICD-10-CM | POA: Diagnosis not present

## 2017-06-05 DIAGNOSIS — E118 Type 2 diabetes mellitus with unspecified complications: Secondary | ICD-10-CM | POA: Diagnosis not present

## 2017-06-05 DIAGNOSIS — Z79899 Other long term (current) drug therapy: Secondary | ICD-10-CM | POA: Diagnosis not present

## 2017-06-05 DIAGNOSIS — E119 Type 2 diabetes mellitus without complications: Secondary | ICD-10-CM | POA: Diagnosis present

## 2017-06-05 DIAGNOSIS — Z23 Encounter for immunization: Secondary | ICD-10-CM

## 2017-06-05 DIAGNOSIS — I10 Essential (primary) hypertension: Secondary | ICD-10-CM | POA: Insufficient documentation

## 2017-06-05 DIAGNOSIS — E1140 Type 2 diabetes mellitus with diabetic neuropathy, unspecified: Secondary | ICD-10-CM | POA: Diagnosis not present

## 2017-06-05 DIAGNOSIS — E1149 Type 2 diabetes mellitus with other diabetic neurological complication: Secondary | ICD-10-CM

## 2017-06-05 DIAGNOSIS — F1410 Cocaine abuse, uncomplicated: Secondary | ICD-10-CM | POA: Insufficient documentation

## 2017-06-05 DIAGNOSIS — M5441 Lumbago with sciatica, right side: Secondary | ICD-10-CM | POA: Insufficient documentation

## 2017-06-05 DIAGNOSIS — Z1211 Encounter for screening for malignant neoplasm of colon: Secondary | ICD-10-CM

## 2017-06-05 DIAGNOSIS — M549 Dorsalgia, unspecified: Secondary | ICD-10-CM | POA: Diagnosis present

## 2017-06-05 LAB — GLUCOSE, POCT (MANUAL RESULT ENTRY): POC Glucose: 181 mg/dl — AB (ref 70–99)

## 2017-06-05 LAB — POCT GLYCOSYLATED HEMOGLOBIN (HGB A1C): Hemoglobin A1C: 8

## 2017-06-05 MED ORDER — GLIPIZIDE ER 10 MG PO TB24: 10.0000 mg | ORAL_TABLET | Freq: Every day | ORAL | 3 refills | Status: DC

## 2017-06-05 MED ORDER — DULOXETINE HCL 60 MG PO CPEP: 60.0000 mg | ORAL_CAPSULE | Freq: Every day | ORAL | 3 refills | Status: DC

## 2017-06-05 MED ORDER — GABAPENTIN 300 MG PO CAPS
600.0000 mg | ORAL_CAPSULE | Freq: Three times a day (TID) | ORAL | 3 refills | Status: DC
Start: 2017-06-05 — End: 2018-07-24

## 2017-06-05 MED ORDER — LIDOCAINE 5 % EX PTCH: 1.0000 | MEDICATED_PATCH | CUTANEOUS | 0 refills | Status: DC

## 2017-06-05 MED FILL — glipiZIDE ER 10 MG TB24: 10 | 30 days supply | Qty: 30 | Fill #0

## 2017-06-05 MED FILL — DULoxetine HCL 60 MG CPEP: 60 | 30 days supply | Qty: 30 | Fill #0

## 2017-06-05 NOTE — Patient Instructions (Signed)
Back Pain, Adult Many adults have back pain from time to time. Common causes of back pain include:  A strained muscle or ligament.  Wear and tear (degeneration) of the spinal disks.  Arthritis.  A hit to the back.  Back pain can be short-lived (acute) or last a long time (chronic). A physical exam, lab tests, and imaging studies may be done to find the cause of your pain. Follow these instructions at home: Managing pain and stiffness  Take over-the-counter and prescription medicines only as told by your health care provider.  If directed, apply heat to the affected area as often as told by your health care provider. Use the heat source that your health care provider recommends, such as a moist heat pack or a heating pad. ? Place a towel between your skin and the heat source. ? Leave the heat on for 20-30 minutes. ? Remove the heat if your skin turns bright red. This is especially important if you are unable to feel pain, heat, or cold. You have a greater risk of getting burned.  If directed, apply ice to the injured area: ? Put ice in a plastic bag. ? Place a towel between your skin and the bag. ? Leave the ice on for 20 minutes, 2-3 times a day for the first 2-3 days. Activity  Do not stay in bed. Resting more than 1-2 days can delay your recovery.  Take short walks on even surfaces as soon as you are able. Try to increase the length of time you walk each day.  Do not sit, drive, or stand in one place for more than 30 minutes at a time. Sitting or standing for long periods of time can put stress on your back.  Use proper lifting techniques. When you bend and lift, use positions that put less stress on your back: ? Bend your knees. ? Keep the load close to your body. ? Avoid twisting.  Exercise regularly as told by your health care provider. Exercising will help your back heal faster. This also helps prevent back injuries by keeping muscles strong and flexible.  Your health  care provider may recommend that you see a physical therapist. This person can help you come up with a safe exercise program. Do any exercises as told by your physical therapist. Lifestyle  Maintain a healthy weight. Extra weight puts stress on your back and makes it difficult to have good posture.  Avoid activities or situations that make you feel anxious or stressed. Learn ways to manage anxiety and stress. One way to manage stress is through exercise. Stress and anxiety increase muscle tension and can make back pain worse. General instructions  Sleep on a firm mattress in a comfortable position. Try lying on your side with your knees slightly bent. If you lie on your back, put a pillow under your knees.  Follow your treatment plan as told by your health care provider. This may include: ? Cognitive or behavioral therapy. ? Acupuncture or massage therapy. ? Meditation or yoga. Contact a health care provider if:  You have pain that is not relieved with rest or medicine.  You have increasing pain going down into your legs or buttocks.  Your pain does not improve in 2 weeks.  You have pain at night.  You lose weight.  You have a fever or chills. Get help right away if:  You develop new bowel or bladder control problems.  You have unusual weakness or numbness in your arms   or legs.  You develop nausea or vomiting.  You develop abdominal pain.  You feel faint. Summary  Many adults have back pain from time to time. A physical exam, lab tests, and imaging studies may be done to find the cause of your pain.  Use proper lifting techniques. When you bend and lift, use positions that put less stress on your back.  Take over-the-counter and prescription medicines and apply heat or ice as directed by your health care provider. This information is not intended to replace advice given to you by your health care provider. Make sure you discuss any questions you have with your health care  provider. Document Released: 02/25/2005 Document Revised: 04/01/2016 Document Reviewed: 04/01/2016 Elsevier Interactive Patient Education  2018 Elsevier Inc.  

## 2017-06-05 NOTE — Progress Notes (Signed)
Subjective:  Patient ID: David Wyatt, male    DOB: 1960-03-18  Age: 57 y.o. MRN: [id number]  CC: Back Pain and Diabetes   HPI David Wyatt  is a 57 year old male with a history of type 2 diabetes mellitus (A1c 8.0), diabetic neuropathy, hypertension, chronic low back pain (status post previous back surgery) who comes into the clinic for a follow-up visit.  His lumbar pain has worsened lately radiating down both lower extremities causing him to be unable to stand up straight and is only relieved by lying down.  He had lumbar spine surgery in 2017 and has hardware in his back which precludes him from having an MRI. Had referred him to pain management previously and he was given Suboxone which he did not do well on.  States whenever he takes a step he feels shocklike sensation go through his body; denies loss of sphincteric function.  Pain is described as severe. Requests a letter to his shelter allowing him to rest during the day.  With regard to his diabetes mellitus he has been compliant with his medications and denies hypoglycemia, visual concerns, neuropathy is controlled on gabapentin.  His blood pressure is elevated and he endorses compliance with his antihypertensive. He was seen at the ED yesterday for vertigo which was thought to be secondary to sinus symptoms for which she was prescribed an antihistamine and Flonase.  Past Medical History:  Diagnosis Date  . Angioedema of lips 07/28/2012   left upper (07/29/2012)  . Arthritis    hands and back  . Chronic back pain   . Cocaine abuse ([place])   . Diabetic neuropathy ([place])   . High cholesterol   . Hypertension   . Neuropathy   . Type II diabetes mellitus ([place])     Past Surgical History:  Procedure Laterality Date  . BACK SURGERY    . HERNIA REPAIR Right 04/01/2012  . I&D EXTREMITY Left 12/13/2012   Procedure: IRRIGATION AND DEBRIDEMENT LEFT THUMB;  Surgeon: Tennis Must, MD;  Location: [facility];  Service: Orthopedics;  Laterality:  Left;  . INCISION AND DRAINAGE OF WOUND     boil on back/notes 07/14/2008 (07/29/2012)  . INGUINAL HERNIA REPAIR  04/01/2012   Procedure: HERNIA REPAIR INGUINAL ADULT;  Surgeon: Haywood Lasso, MD;  Location: [facility];  Service: General;  Laterality: Right;  . INGUINAL HERNIA REPAIR Left 09/06/2016   Procedure: OPEN REPAIR LEFT INGUINAL HERNIA;  Surgeon: Greer Pickerel, MD;  Location: [facility];  Service: General;  Laterality: Left;  . INSERTION OF MESH Left 09/06/2016   Procedure: INSERTION OF MESH;  Surgeon: Greer Pickerel, MD;  Location: [facility];  Service: General;  Laterality: Left;  . TONSILLECTOMY      Allergies  Allergen Reactions  . Lisinopril Other (See Comments)    Lip Swelling, angioedema     Outpatient Medications Prior to Visit  Medication Sig Dispense Refill  . amLODipine (NORVASC) 2.5 MG tablet Take 1 tablet (2.5 mg total) by mouth daily. 30 tablet 6  . atorvastatin (LIPITOR) 40 MG tablet Take 1 tablet (40 mg total) by mouth daily. 30 tablet 6  . fluticasone (FLONASE) 50 MCG/ACT nasal spray Place 2 sprays into both nostrils daily. 16 g 0  . loratadine (CLARITIN) 10 MG tablet Take 1 tablet (10 mg total) by mouth daily. 30 tablet 0  . meclizine (ANTIVERT) 25 MG tablet Take 1 tablet (25 mg total) by mouth 3 (three) times daily as needed for dizziness. 30 tablet 0  .  metFORMIN (GLUCOPHAGE) 500 MG tablet Take 2 tablets (1,000 mg total) by mouth 2 (two) times daily with a meal. 120 tablet 6  . methocarbamol (ROBAXIN) 500 MG tablet Take 2 tablets (1,000 mg total) by mouth 2 (two) times daily as needed (Pain). 80 tablet 1  . naproxen (NAPROSYN) 500 MG tablet Take 1 tablet (500 mg total) by mouth 2 (two) times daily. 60 tablet 3  . gabapentin (NEURONTIN) 300 MG capsule Take 1 capsule (300 mg total) by mouth 3 (three) times daily. 90 capsule 6  . glipiZIDE (GLUCOTROL XL) 5 MG 24 hr tablet Take 1 tablet (5 mg total) by mouth daily with breakfast. 30 tablet 6   No facility-administered  medications prior to visit.     ROS Review of Systems  Constitutional: Negative for activity change and appetite change.  HENT: Negative for sinus pressure and sore throat.   Eyes: Negative for visual disturbance.  Respiratory: Negative for cough, chest tightness and shortness of breath.   Cardiovascular: Negative for chest pain and leg swelling.  Gastrointestinal: Negative for abdominal distention, abdominal pain, constipation and diarrhea.  Endocrine: Negative.   Genitourinary: Negative for dysuria.  Musculoskeletal: Positive for back pain. Negative for joint swelling and myalgias.  Skin: Negative for rash.  Allergic/Immunologic: Negative.   Neurological: Negative for weakness, light-headedness and numbness.  Psychiatric/Behavioral: Negative for dysphoric mood and suicidal ideas.    Objective:  BP (!) 154/91   Pulse 71   Temp 97.8 F (36.6 C) (Oral)   Ht 5\' 8"  (1.727 m)   Wt 180 lb (81.6 kg)   SpO2 94%   BMI 27.37 kg/m   BP/Weight 06/05/2017 06/03/2017 2/77/8242  Systolic BP 353 614 431  Diastolic BP 91 83 72  Wt. (Lbs) 180 186 -  BMI 27.37 28.28 -      Physical Exam  Constitutional: He is oriented to person, place, and time. He appears well-developed and well-nourished.  Cardiovascular: Normal rate, normal heart sounds and intact distal pulses.  No murmur heard. Pulmonary/Chest: Effort normal and breath sounds normal. He has no wheezes. He has no rales. He exhibits no tenderness.  Abdominal: Soft. Bowel sounds are normal. He exhibits no distension and no mass. There is no tenderness.  Musculoskeletal: Normal range of motion. He exhibits tenderness (TTP of lumbar spine and on lying supine; neg straight leg raise b/l).  Neurological: He is alert and oriented to person, place, and time.  Skin: Skin is warm and dry.  Psychiatric: He has a normal mood and affect.    Lab Results  Component Value Date   HGBA1C 8.0 06/05/2017    Assessment & Plan:   1. Controlled  type 2 diabetes mellitus with complication, without long-term current use of insulin (HCC) Controlled with A1c of 8.0 Increase glipizide from 5 mg to 10 mg twice daily Diabetic diet, lifestyle modifications - POCT glucose (manual entry) - POCT glycosylated hemoglobin (Hb A1C) - glipiZIDE (GLUCOTROL XL) 10 MG 24 hr tablet; Take 1 tablet (10 mg total) by mouth daily with breakfast.  Dispense: 30 tablet; Refill: 3 - Lipid panel; Future - Microalbumin/Creatinine Ratio, Urine; Future  2. Chronic bilateral low back pain with bilateral sciatica Unable to have an MRI as he has metal hardware in his lumbar spine We will add lidocaine patch and Cymbalta Previously referred to pain management however he was treated with Suboxone which he did not do well on - Ambulatory referral to Spine Surgery - gabapentin (NEURONTIN) 300 MG capsule; Take 2 capsules (  600 mg total) by mouth 3 (three) times daily.  Dispense: 180 capsule; Refill: 3 - lidocaine (LIDODERM) 5 %; Place 1 patch onto the skin daily. Remove & Discard patch within 12 hours or as directed by MD  Dispense: 30 patch; Refill: 0 - DULoxetine (CYMBALTA) 60 MG capsule; Take 1 capsule (60 mg total) by mouth daily.  Dispense: 30 capsule; Refill: 3  3. Other diabetic neurological complication associated with type 2 diabetes mellitus (HCC) Stable - gabapentin (NEURONTIN) 300 MG capsule; Take 2 capsules (600 mg total) by mouth 3 (three) times daily.  Dispense: 180 capsule; Refill: 3  4. Screening for colon cancer - Ambulatory referral to Gastroenterology  5. Need for vaccination for Strep pneumoniae - Pneumococcal polysaccharide vaccine 23-valent greater than or equal to 2yo subcutaneous/IM  6. Lumbar disc herniation S/p lumbar spine surgery - lidocaine (LIDODERM) 5 %; Place 1 patch onto the skin daily. Remove & Discard patch within 12 hours or as directed by MD  Dispense: 30 patch; Refill: 0 - DULoxetine (CYMBALTA) 60 MG capsule; Take 1 capsule (60  mg total) by mouth daily.  Dispense: 30 capsule; Refill: 3  7.  Hypertension Elevated blood pressure Low-sodium, DASH diet We will adjust hypertensive regimen at next visit if still elevated  Meds ordered this encounter  Medications  . gabapentin (NEURONTIN) 300 MG capsule    Sig: Take 2 capsules (600 mg total) by mouth 3 (three) times daily.    Dispense:  180 capsule    Refill:  3    Discontinue previous dose  . lidocaine (LIDODERM) 5 %    Sig: Place 1 patch onto the skin daily. Remove & Discard patch within 12 hours or as directed by MD    Dispense:  30 patch    Refill:  0  . DULoxetine (CYMBALTA) 60 MG capsule    Sig: Take 1 capsule (60 mg total) by mouth daily.    Dispense:  30 capsule    Refill:  3  . glipiZIDE (GLUCOTROL XL) 10 MG 24 hr tablet    Sig: Take 1 tablet (10 mg total) by mouth daily with breakfast.    Dispense:  30 tablet    Refill:  3    Discontinue previous dose    Follow-up: Return in about 3 months (around 09/05/2017) for Follow-up of chronic medical conditions.   [name] MD

## 2017-06-06 ENCOUNTER — Ambulatory Visit: Payer: Medicaid Other | Attending: Family Medicine

## 2017-06-06 ENCOUNTER — Encounter: Payer: Self-pay | Admitting: Family Medicine

## 2017-06-06 ENCOUNTER — Other Ambulatory Visit: Payer: Self-pay | Admitting: Family Medicine

## 2017-06-06 DIAGNOSIS — E118 Type 2 diabetes mellitus with unspecified complications: Secondary | ICD-10-CM | POA: Diagnosis present

## 2017-06-06 NOTE — Progress Notes (Signed)
Patient here for lab visit only 

## 2017-06-07 LAB — LIPID PANEL
CHOLESTEROL TOTAL: 164 mg/dL (ref 100–199)
Chol/HDL Ratio: 2.4 ratio (ref 0.0–5.0)
HDL: 69 mg/dL (ref >39)
LDL CALC: 76 mg/dL (ref 0–99)
TRIGLYCERIDES: 94 mg/dL (ref 0–149)
VLDL Cholesterol Cal: 19 mg/dL (ref 5–40)

## 2017-06-09 ENCOUNTER — Telehealth: Payer: Self-pay

## 2017-06-09 ENCOUNTER — Encounter: Payer: Self-pay | Admitting: Pharmacist

## 2017-06-09 NOTE — Progress Notes (Signed)
PA submitted and approved for Lidocaine patches. Approval #[id number]

## 2017-06-09 NOTE — Telephone Encounter (Signed)
Patient was called and informed of lab results. 

## 2017-06-10 NOTE — Congregational Nurse Program (Signed)
Congregational Nurse Program Note  Date of Encounter: 05/19/2017  Past Medical History: Past Medical History:  Diagnosis Date  . Angioedema of lips 07/28/2012   left upper (07/29/2012)  . Arthritis    hands and back  . Chronic back pain   . Cocaine abuse ([place])   . Diabetic neuropathy ([place])   . High cholesterol   . Hypertension   . Neuropathy   . Type II diabetes mellitus ([place])     Encounter Details: CNP Questionnaire - 06/10/17 1703      Questionnaire   Patient Status  Not Applicable    Race  Black or African American    Location Patient [place]  Medicaid    Uninsured  Not Applicable    Food  Within past 12 months, worried food would run out with no money to buy more    Housing/Utilities  No permanent housing    Transportation  Provided transportation assistance (bus pass, taxi voucher, etc.)    Interpersonal Safety  Yes, feel physically and emotionally safe where you currently live    Medication  Yes, have medication insecurities;Provided medication assistance    Medical Provider  Yes    Referrals  Other;Medication Assistance    ED Visit Averted  Not Applicable    Life-Saving Intervention Made  Not Applicable      Clinical Intake - 06/05/17 8413      Pre-visit preparation   Pre-visit preparation completed  Yes      Pain   Pain   0-10    Pain Score  6     Pain Location  Back    Pain Orientation  Lower      Nutrition Screen   Diabetes  Yes    CBG done?  Yes    CBG resulted in Enter/ Edit results?  Yes    Did pt. bring in CBG monitor from home?  No      Functional Status   Activities of Daily Living  Independent    Ambulation  Independent    Medication Administration  Independent    Home Management  Independent      Abuse/Neglect   Do you feel unsafe in your current relationship?  No    Do you feel physically threatened by others?  No    Anyone hurting you at home, work, or school?  No    Unable to ask?  No      Arboriculturist Needed?  No     Client needed bus passes.

## 2017-06-19 ENCOUNTER — Telehealth: Payer: Self-pay | Admitting: Family Medicine

## 2017-06-19 NOTE — Telephone Encounter (Signed)
Sent Referral to [facility] ph. # [phone] .They will contact the patient to schedule an appointment and I will follow up.

## 2017-06-19 NOTE — Telephone Encounter (Signed)
Pt called to request an update on his referral to the Neurosurgery please follow up

## 2017-06-23 ENCOUNTER — Encounter: Payer: Self-pay | Admitting: Pediatric Intensive Care

## 2017-07-17 NOTE — Congregational Nurse Program (Signed)
Congregational Nurse Program Note  Date of Encounter: 06/23/2017  Past Medical History: Past Medical History:  Diagnosis Date  . Angioedema of lips 07/28/2012   left upper (07/29/2012)  . Arthritis    hands and back  . Chronic back pain   . Cocaine abuse ([place])   . Diabetic neuropathy ([place])   . High cholesterol   . Hypertension   . Neuropathy   . Type II diabetes mellitus ([place])     Encounter Details: CNP Questionnaire - 06/23/17 1045      Questionnaire   Patient Status  Not Applicable    Race  Black or African American    Location Patient [place]  Medicaid    Uninsured  Not Applicable    Food  Within past 12 months, worried food would run out with no money to buy more    Housing/Utilities  No permanent housing    Transportation  Yes, need transportation assistance    Interpersonal Safety  Yes, feel physically and emotionally safe where you currently live    Medication  Yes, have medication insecurities    Medical Provider  Yes    Referrals  Other    ED Visit Averted  Not Applicable    Life-Saving Intervention Made  Not Applicable     Requests assistance from CN to check on status of referral to [facility]. CN left message with client for referral coordinator.

## 2017-09-04 ENCOUNTER — Ambulatory Visit: Payer: Medicaid Other | Admitting: Family Medicine

## 2017-09-08 ENCOUNTER — Ambulatory Visit: Payer: Medicaid Other | Admitting: Family Medicine

## 2017-10-27 ENCOUNTER — Ambulatory Visit: Payer: Medicaid Other | Admitting: Diagnostic Neuroimaging

## 2017-10-27 ENCOUNTER — Encounter: Payer: Self-pay | Admitting: Diagnostic Neuroimaging

## 2017-10-27 VITALS — BP 125/75 | HR 66 | Ht 68.0 in | Wt 167.4 lb

## 2017-10-27 DIAGNOSIS — R202 Paresthesia of skin: Secondary | ICD-10-CM

## 2017-10-27 DIAGNOSIS — R200 Anesthesia of skin: Secondary | ICD-10-CM

## 2017-10-27 NOTE — Progress Notes (Addendum)
[facility]  PATIENT: David Wyatt DOB: 11-15-1960  REFERRING CLINICIAN: ER and Margarita Rana, E HISTORY FROM: patient  REASON FOR VISIT: follow up    HISTORICAL  CHIEF COMPLAINT:  Chief Complaint  Patient presents with  . R arm, face tingling    rm 7, David Wyatt- sig other, "no change in tingling; low back pain that shoots down left leg to ankle; not taking ASA"  . Follow-up    HISTORY OF PRESENT ILLNESS:   UPDATE (10/27/17, VRP): Since last visit, doing about the same. Symptoms are stable; right sided tingling. Severity is moderate. No alleviating or aggravating factors. Tolerating meds.   TIA/stroke workup completed. Now with low back pain radiating to left leg --> saw [facility] (patient not sure of results).   PRIOR HPI (04/29/17): 57 year old male with hypertension, diabetes, hypercholesterolemia, here for evaluation of right face, arm, leg tingling sensation.  Symptoms started approximately 2 months ago.  Symptoms have gradually improved over time.  Now he has needle sensation mainly in his right hand.  He has a pulling sensation in his right hand and wrist.  He has mild sensation in his right face and right leg as well.  Patient was evaluated in the emergency room in November 2018 for dizziness.  He was in emergency room in January 2019 for low back pain and right hemibody painful paresthesia.  He was evaluated with CT of the head which was negative for acute findings.  He was recommended follow-up with PCP and neurology.  Patient has been having trouble with his blood sugars ranging from 200-300 lately.  Patient lives in a shelter and has limited access for healthy food.  Patient has history of lumbar spine surgery in the past.   REVIEW OF SYSTEMS: Full 14 system review of systems performed and negative with exception of: agitation confusion depression pain blurred vision.   ALLERGIES: Allergies  Allergen Reactions  . Lisinopril Other (See Comments)    Lip  Swelling, angioedema    HOME MEDICATIONS: Outpatient Medications Prior to Visit  Medication Sig Dispense Refill  . amLODipine (NORVASC) 2.5 MG tablet Take 1 tablet (2.5 mg total) by mouth daily. 30 tablet 6  . atorvastatin (LIPITOR) 40 MG tablet Take 1 tablet (40 mg total) by mouth daily. 30 tablet 6  . DULoxetine (CYMBALTA) 60 MG capsule Take 1 capsule (60 mg total) by mouth daily. 30 capsule 3  . fluticasone (FLONASE) 50 MCG/ACT nasal spray Place 2 sprays into both nostrils daily. 16 g 0  . gabapentin (NEURONTIN) 300 MG capsule Take 2 capsules (600 mg total) by mouth 3 (three) times daily. 180 capsule 3  . glipiZIDE (GLUCOTROL XL) 10 MG 24 hr tablet Take 1 tablet (10 mg total) by mouth daily with breakfast. 30 tablet 3  . lidocaine (LIDODERM) 5 % Place 1 patch onto the skin daily. Remove & Discard patch within 12 hours or as directed by MD 30 patch 0  . loratadine (CLARITIN) 10 MG tablet Take 1 tablet (10 mg total) by mouth daily. 30 tablet 0  . meclizine (ANTIVERT) 25 MG tablet Take 1 tablet (25 mg total) by mouth 3 (three) times daily as needed for dizziness. 30 tablet 0  . metFORMIN (GLUCOPHAGE) 500 MG tablet Take 2 tablets (1,000 mg total) by mouth 2 (two) times daily with a meal. 120 tablet 6  . methocarbamol (ROBAXIN) 500 MG tablet Take 2 tablets (1,000 mg total) by mouth 2 (two) times daily as needed (Pain). 80 tablet 1  .  naproxen (NAPROSYN) 500 MG tablet Take 1 tablet (500 mg total) by mouth 2 (two) times daily. 60 tablet 3   No facility-administered medications prior to visit.     PAST MEDICAL HISTORY: Past Medical History:  Diagnosis Date  . Angioedema of lips 07/28/2012   left upper (07/29/2012)  . Arthritis    hands and back  . Chronic back pain   . Cocaine abuse ([place])   . Diabetic neuropathy ([place])   . High cholesterol   . Hypertension   . Neuropathy   . Type II diabetes mellitus ([place])     PAST SURGICAL HISTORY: Past Surgical History:  Procedure Laterality Date    . BACK SURGERY    . HERNIA REPAIR Right 04/01/2012  . I&D EXTREMITY Left 12/13/2012   Procedure: IRRIGATION AND DEBRIDEMENT LEFT THUMB;  Surgeon: Tennis Must, MD;  Location: [place];  Service: Orthopedics;  Laterality: Left;  . INCISION AND DRAINAGE OF WOUND     boil on back/notes 07/14/2008 (07/29/2012)  . INGUINAL HERNIA REPAIR  04/01/2012   Procedure: HERNIA REPAIR INGUINAL ADULT;  Surgeon: Haywood Lasso, MD;  Location: [place];  Service: General;  Laterality: Right;  . INGUINAL HERNIA REPAIR Left 09/06/2016   Procedure: OPEN REPAIR LEFT INGUINAL HERNIA;  Surgeon: Greer Pickerel, MD;  Location: [place];  Service: General;  Laterality: Left;  . INSERTION OF MESH Left 09/06/2016   Procedure: INSERTION OF MESH;  Surgeon: Greer Pickerel, MD;  Location: [place];  Service: General;  Laterality: Left;  . TONSILLECTOMY      FAMILY HISTORY: Family History  Problem Relation Age of Onset  . Kidney disease Mother   . Diabetes Father     SOCIAL HISTORY:  Social History   Socioeconomic History  . Marital status: Divorced    Spouse name: Not on file  . Number of children: 3  . Years of education: 53  . Highest education level: Not on file  Occupational History    Comment: disabled  Social Needs  . Financial resource strain: Not on file  . Food insecurity:    Worry: Not on file    Inability: Not on file  . Transportation needs:    Medical: Not on file    Non-medical: Not on file  Tobacco Use  . Smoking status: Current Every Day Smoker    Packs/day: 0.25    Years: 30.00    Pack years: 7.50    Types: Cigarettes  . Smokeless tobacco: Former Systems developer    Quit date: 08/15/2015  . Tobacco comment: 04/29/16 2  cigs daily, 10/27/17 sometimes < .25 PPD  Substance and Sexual Activity  . Alcohol use: No    Alcohol/week: 0.0 standard drinks  . Drug use: Yes    Types: "Crack" cocaine    Comment: last use 08/23/16  . Sexual activity: Not on file  Lifestyle  . Physical activity:    Days per week: Not on  file    Minutes per session: Not on file  . Stress: Not on file  Relationships  . Social connections:    Talks on phone: Not on file    Gets together: Not on file    Attends religious service: Not on file    Active member of club or organization: Not on file    Attends meetings of clubs or organizations: Not on file    Relationship status: Not on file  . Intimate partner violence:    Fear of current or ex partner: Not  on file    Emotionally abused: Not on file    Physically abused: Not on file    Forced sexual activity: Not on file  Other Topics Concern  . Not on file  Social History Narrative   04/29/17   Lives in shelter   Caffeine- a lot of  tea, coffee     PHYSICAL EXAM  GENERAL EXAM/CONSTITUTIONAL: Vitals:  Vitals:   10/27/17 1413  BP: 125/75  Pulse: 66  Weight: 167 lb 6.4 oz (75.9 kg)  Height: 5\' 8"  (1.727 m)     Body mass index is 25.45 kg/m. Wt Readings from Last 3 Encounters:  10/27/17 167 lb 6.4 oz (75.9 kg)  06/05/17 180 lb (81.6 kg)  06/03/17 186 lb (84.4 kg)     Patient is in no distress; well developed, nourished and groomed; neck is supple  SLOW MOVEMENTS; TIRED APPEARING  CARDIOVASCULAR:  Examination of carotid arteries is normal; no carotid bruits  Regular rate and rhythm, no murmurs  Examination of peripheral vascular system by observation and palpation is normal  EYES:  Ophthalmoscopic exam of optic discs and posterior segments is normal; no papilledema or hemorrhages  No exam data present  MUSCULOSKELETAL:  Gait, strength, tone, movements noted in Neurologic exam below  NEUROLOGIC: MENTAL STATUS:  No flowsheet data found.  awake, alert, oriented to person, place and time  recent and remote memory intact  normal attention and concentration  language fluent, comprehension intact, naming intact  fund of knowledge appropriate  CRANIAL NERVE:   2nd - no papilledema on fundoscopic exam  2nd, 3rd, 4th, 6th - pupils  equal and reactive to light, visual fields full to confrontation, extraocular muscles intact, no nystagmus  5th - facial sensation symmetric  7th - facial strength symmetric  8th - hearing intact  9th - palate elevates symmetrically, uvula midline  11th - shoulder shrug symmetric  12th - tongue protrusion midline  MOTOR:   normal bulk and tone, full strength in the BUE, BLE  SENSORY:   normal and symmetric to light touch  COORDINATION:   finger-nose-finger, fine finger movements normal  REFLEXES:   deep tendon reflexes trace and symmetric  GAIT/STATION:   narrow based gait     DIAGNOSTIC DATA (LABS, IMAGING, TESTING) - I reviewed patient records, labs, notes, testing and imaging myself where available.  Lab Results  Component Value Date   WBC 5.3 06/03/2017   HGB 13.5 06/03/2017   HCT 41.1 06/03/2017   MCV 94.9 06/03/2017   PLT 336 06/03/2017      Component Value Date/Time   NA 140 06/03/2017 0840   K 4.2 06/03/2017 0840   CL 109 06/03/2017 0840   CO2 24 06/03/2017 0840   GLUCOSE 135 (H) 06/03/2017 0840   BUN 13 06/03/2017 0840   CREATININE 1.01 06/03/2017 0840   CREATININE 1.15 05/22/2016 0852   CALCIUM 8.8 (L) 06/03/2017 0840   PROT 7.0 06/03/2017 0840   ALBUMIN 3.5 06/03/2017 0840   AST 21 06/03/2017 0840   ALT 12 (L) 06/03/2017 0840   ALKPHOS 84 06/03/2017 0840   BILITOT 0.6 06/03/2017 0840   GFRNONAA >60 06/03/2017 0840   GFRNONAA 71 05/22/2016 0852   GFRAA >60 06/03/2017 0840   GFRAA 82 05/22/2016 0852   Lab Results  Component Value Date   CHOL 164 06/06/2017   HDL 69 06/06/2017   LDLCALC 76 06/06/2017   TRIG 94 06/06/2017   CHOLHDL 2.4 06/06/2017   Lab Results  Component Value Date  HGBA1C 8.0 06/05/2017   No results found for: VITAMINB12 No results found for: TSH   04/03/17 CT head [I reviewed images myself and agree with interpretation. -VRP]  - Chronic microvascular disease without acute intracranial  abnormality.  05/06/17 MRI brain (without) [I reviewed images myself and agree with interpretation. -VRP]  1. Mild periventricular, subcortical and pontine foci of chronic small vessel ischemic disease. 2. No acute findings.  05/16/17 TTE - Normal LV systolic function; mild LVH; trace MR and TR.  05/16/17 carotid u/s Right Carotid: There is no evidence of stenosis in the right ICA. Left Carotid: There is no evidence of stenosis in the left ICA. Vertebrals: Both vertebral arteries were patent with antegrade flow. Subclavians: Normal flow hemodynamics were seen in bilateral subclavian arteries.     ASSESSMENT AND PLAN  57 y.o. year old male here with new onset right face, arm, leg tingling painful sensation starting December 2018 and January 2019.  Symptoms localized to left brain subcortical region.  Given patient's medical history I would be concerned for left brain subcortical ischemic infarction, lacunar type. However MRI was negative for infarct.    Ddx: right face / arm / leg tingling  1. Right arm and right face tingling   2. Numbness and tingling of right leg      PLAN:  RIGHT FACE, ARM , LEG NUMBNESS; SUSPECTED MRI NEGATIVE STROKE - stable; continue medical mgmt: start aspirin 81mg  daily; continue diabetes treatment, high blood pressure treatment, statin  LOW BACK PAIN / leg pain - consider PT evaluation; pain mgmt per PCP and spine surgery clinic  Return if symptoms worsen or fail to improve, for return to PCP.    Penni Bombard, MD 3/83/2919, 1:66 PM Certified in Neurology, Neurophysiology and Neuroimaging  [facility] [address] [place], [place] [address] [phone]

## 2017-12-21 ENCOUNTER — Emergency Department (HOSPITAL_COMMUNITY): Payer: Medicaid Other

## 2017-12-21 ENCOUNTER — Other Ambulatory Visit: Payer: Self-pay

## 2017-12-21 ENCOUNTER — Encounter (HOSPITAL_COMMUNITY): Payer: Self-pay | Admitting: Emergency Medicine

## 2017-12-21 ENCOUNTER — Emergency Department (HOSPITAL_COMMUNITY)
Admission: EM | Admit: 2017-12-21 | Discharge: 2017-12-21 | Disposition: A | Discharge status: Home / Self Care | Payer: Medicaid Other | Attending: Emergency Medicine | Admitting: Emergency Medicine

## 2017-12-21 DIAGNOSIS — M51369 Other intervertebral disc degeneration, lumbar region without mention of lumbar back pain or lower extremity pain: Secondary | ICD-10-CM

## 2017-12-21 DIAGNOSIS — M5136 Other intervertebral disc degeneration, lumbar region: Secondary | ICD-10-CM

## 2017-12-21 DIAGNOSIS — F17210 Nicotine dependence, cigarettes, uncomplicated: Secondary | ICD-10-CM | POA: Diagnosis not present

## 2017-12-21 DIAGNOSIS — M545 Low back pain: Secondary | ICD-10-CM | POA: Diagnosis present

## 2017-12-21 DIAGNOSIS — I10 Essential (primary) hypertension: Secondary | ICD-10-CM | POA: Insufficient documentation

## 2017-12-21 DIAGNOSIS — M436 Torticollis: Secondary | ICD-10-CM | POA: Diagnosis not present

## 2017-12-21 DIAGNOSIS — Z79899 Other long term (current) drug therapy: Secondary | ICD-10-CM | POA: Insufficient documentation

## 2017-12-21 DIAGNOSIS — E119 Type 2 diabetes mellitus without complications: Secondary | ICD-10-CM | POA: Diagnosis not present

## 2017-12-21 LAB — CBC WITH DIFFERENTIAL/PLATELET
ABS IMMATURE GRANULOCYTES: 0.01 10*3/uL (ref 0.00–0.07)
BASOS PCT: 1 %
Basophils Absolute: 0 10*3/uL (ref 0.0–0.1)
Eosinophils Absolute: 0.1 10*3/uL (ref 0.0–0.5)
Eosinophils Relative: 2 %
HCT: 40.6 % (ref 39.0–52.0)
Hemoglobin: 13.3 g/dL (ref 13.0–17.0)
IMMATURE GRANULOCYTES: 0 %
Lymphocytes Relative: 26 %
Lymphs Abs: 1.7 10*3/uL (ref 0.7–4.0)
MCH: 30.9 pg (ref 26.0–34.0)
MCHC: 32.8 g/dL (ref 30.0–36.0)
MCV: 94.4 fL (ref 80.0–100.0)
MONOS PCT: 7 %
Monocytes Absolute: 0.4 10*3/uL (ref 0.1–1.0)
NEUTROS ABS: 4.1 10*3/uL (ref 1.7–7.7)
NEUTROS PCT: 64 %
PLATELETS: 352 10*3/uL (ref 150–400)
RBC: 4.3 MIL/uL (ref 4.22–5.81)
RDW: 13.2 % (ref 11.5–15.5)
WBC: 6.4 10*3/uL (ref 4.0–10.5)
nRBC: 0 % (ref 0.0–0.2)

## 2017-12-21 LAB — BASIC METABOLIC PANEL
ANION GAP: 7 (ref 5–15)
BUN: 11 mg/dL (ref 6–20)
CO2: 24 mmol/L (ref 22–32)
Calcium: 9 mg/dL (ref 8.9–10.3)
Chloride: 108 mmol/L (ref 98–111)
Creatinine, Ser: 1.11 mg/dL (ref 0.61–1.24)
GFR calc Af Amer: >60 mL/min (ref >60)
GLUCOSE: 234 mg/dL — AB (ref 70–99)
POTASSIUM: 3.5 mmol/L (ref 3.5–5.1)
Sodium: 139 mmol/L (ref 135–145)

## 2017-12-21 MED ORDER — ACETAMINOPHEN 325 MG PO TABS
650.0000 mg | ORAL_TABLET | Freq: Once | ORAL | Status: AC
  Administered 2017-12-21: 650 mg via ORAL
  Filled 2017-12-21: qty 2

## 2017-12-21 MED ORDER — OXYCODONE-ACETAMINOPHEN 5-325 MG PO TABS
1.0000 | ORAL_TABLET | Freq: Once | ORAL | Status: DC
Start: 2017-12-21 — End: 2017-12-21

## 2017-12-21 MED ORDER — OXYCODONE-ACETAMINOPHEN 5-325 MG PO TABS
1.0000 | ORAL_TABLET | Freq: Once | ORAL | Status: AC
  Administered 2017-12-21: 1 via ORAL
  Filled 2017-12-21: qty 1

## 2017-12-21 MED ORDER — NAPROXEN 500 MG PO TABS: 500.0000 mg | ORAL_TABLET | Freq: Two times a day (BID) | ORAL | 0 refills | Status: DC

## 2017-12-21 MED ORDER — CYCLOBENZAPRINE HCL 10 MG PO TABS
10.0000 mg | ORAL_TABLET | Freq: Once | ORAL | Status: AC
  Administered 2017-12-21: 10 mg via ORAL
  Filled 2017-12-21: qty 1

## 2017-12-21 MED ORDER — GADOBUTROL 1 MMOL/ML IV SOLN
7.0000 mL | Freq: Once | INTRAVENOUS | Status: AC | PRN
  Administered 2017-12-21: 7 mL via INTRAVENOUS

## 2017-12-21 MED ORDER — CYCLOBENZAPRINE HCL 10 MG PO TABS: 10.0000 mg | ORAL_TABLET | Freq: Two times a day (BID) | ORAL | 0 refills | Status: DC | PRN

## 2017-12-21 NOTE — ED Provider Notes (Signed)
[facility] EMERGENCY DEPARTMENT Provider Note   CSN: [id number] Arrival date & time: 12/21/17  1033     History   Chief Complaint Chief Complaint  Patient presents with  . Back Pain  . Neck Pain    HPI David Wyatt is a 57 y.o. male past medical history of cocaine abuse, arthritis, diabetes, neuropathy, hypertension, status post L4-L5 fusion in 2016 who presents to ED for multiple complaints. His first complaint is a 3-day history of lower back pain.  States that the pain is located in the middle of his lower back.  States that "last time I got this pain, they ended up having to do surgery."  No improvement with his home gabapentin.  He does state that for the past 3 days he feels like he is going to "pee on myself."  He denies any injuries, falls.  States that the pain will radiate down both legs.  He does have a history of "tingling" to entire right side of his body for the past several months that he is being evaluated by neurology for.  He denies any fevers, chest pain, abdominal pain, vomiting, dysuria, hematuria, history of cancer. His next complaint is neck pain.  He states that he sleeps in a [place] and believes he may have slept in a different position.  He denies any fever, injuries, stiffness.  HPI  Past Medical History:  Diagnosis Date  . Angioedema of lips 07/28/2012   left upper (07/29/2012)  . Arthritis    hands and back  . Chronic back pain   . Cocaine abuse ([place])   . Diabetic neuropathy ([place])   . High cholesterol   . Hypertension   . Neuropathy   . Type II diabetes mellitus [facility])     Patient Active Problem List   Diagnosis Date Noted  . Hyperlipidemia 04/24/2017  . GERD (gastroesophageal reflux disease) 06/13/2015  . Controlled type 2 diabetes mellitus with complication, without long-term current use of insulin ([place]) 05/01/2015  . Diabetic neuropathy ([place]) 05/01/2015  . Essential hypertension, benign 11/01/2013  . Lumbar disc herniation  09/13/2013  . Unspecified constipation 09/13/2013  . Diabetes mellitus due to underlying condition without complications ([place]) 90/24/0973  . Chronic back pain 09/13/2013  . Viral gastroenteritis 05/05/2013  . Hypoglycemia 05/05/2013  . Felon of finger of left hand with lymphangitis 12/13/2012  . Hyperglycemia 07/29/2012  . Angioedema of lips 07/29/2012  . Crack cocaine use 07/29/2012  . Arthritis 07/29/2012  . Weakness generalized 07/29/2012    Past Surgical History:  Procedure Laterality Date  . BACK SURGERY    . HERNIA REPAIR Right 04/01/2012  . I&D EXTREMITY Left 12/13/2012   Procedure: IRRIGATION AND DEBRIDEMENT LEFT THUMB;  Surgeon: Tennis Must, MD;  Location: [place];  Service: Orthopedics;  Laterality: Left;  . INCISION AND DRAINAGE OF WOUND     boil on back/notes 07/14/2008 (07/29/2012)  . INGUINAL HERNIA REPAIR  04/01/2012   Procedure: HERNIA REPAIR INGUINAL ADULT;  Surgeon: Haywood Lasso, MD;  Location: [facility];  Service: General;  Laterality: Right;  . INGUINAL HERNIA REPAIR Left 09/06/2016   Procedure: OPEN REPAIR LEFT INGUINAL HERNIA;  Surgeon: Greer Pickerel, MD;  Location: [place];  Service: General;  Laterality: Left;  . INSERTION OF MESH Left 09/06/2016   Procedure: INSERTION OF MESH;  Surgeon: Greer Pickerel, MD;  Location: [place];  Service: General;  Laterality: Left;  . TONSILLECTOMY          Home Medications  Prior to Admission medications   Medication Sig Start Date End Date Taking? Authorizing Provider  amLODipine (NORVASC) 2.5 MG tablet Take 1 tablet (2.5 mg total) by mouth daily. 04/24/17   [name], MD  atorvastatin (LIPITOR) 40 MG tablet Take 1 tablet (40 mg total) by mouth daily. 04/24/17   [name], MD  cyclobenzaprine (FLEXERIL) 10 MG tablet Take 1 tablet (10 mg total) by mouth 2 (two) times daily as needed for muscle spasms. 12/21/17   Zollie Ellery, PA-C  DULoxetine (CYMBALTA) 60 MG capsule Take 1 capsule (60 mg total) by mouth daily. 06/05/17    [name], MD  fluticasone (FLONASE) 50 MCG/ACT nasal spray Place 2 sprays into both nostrils daily. 06/03/17   [name], MD  gabapentin (NEURONTIN) 300 MG capsule Take 2 capsules (600 mg total) by mouth 3 (three) times daily. 06/05/17   [name], MD  glipiZIDE (GLUCOTROL XL) 10 MG 24 hr tablet Take 1 tablet (10 mg total) by mouth daily with breakfast. 06/05/17   [name], MD  lidocaine (LIDODERM) 5 % Place 1 patch onto the skin daily. Remove & Discard patch within 12 hours or as directed by MD 06/05/17   [name], MD  loratadine (CLARITIN) 10 MG tablet Take 1 tablet (10 mg total) by mouth daily. 06/03/17   [name], MD  meclizine (ANTIVERT) 25 MG tablet Take 1 tablet (25 mg total) by mouth 3 (three) times daily as needed for dizziness. 06/03/17   [name], MD  metFORMIN (GLUCOPHAGE) 500 MG tablet Take 2 tablets (1,000 mg total) by mouth 2 (two) times daily with a meal. 04/24/17   [name], MD  methocarbamol (ROBAXIN) 500 MG tablet Take 2 tablets (1,000 mg total) by mouth 2 (two) times daily as needed (Pain). 04/24/17   [name], MD  naproxen (NAPROSYN) 500 MG tablet Take 1 tablet (500 mg total) by mouth 2 (two) times daily. 12/21/17   Delia Heady, PA-C    Family History Family History  Problem Relation Age of Onset  . Kidney disease Mother   . Diabetes Father     Social History Social History   Tobacco Use  . Smoking status: Current Every Day Smoker    Packs/day: 0.25    Years: 30.00    Pack years: 7.50    Types: Cigarettes  . Smokeless tobacco: Former Systems developer    Quit date: 08/15/2015  . Tobacco comment: 04/29/16 2  cigs daily, 10/27/17 sometimes < .25 PPD  Substance Use Topics  . Alcohol use: No    Alcohol/week: 0.0 standard drinks  . Drug use: Yes    Types: "Crack" cocaine    Comment: last use 08/23/16     Allergies   Lisinopril   Review of Systems Review of Systems  Constitutional: Negative for appetite change,  chills and fever.  HENT: Negative for ear pain, rhinorrhea, sneezing and sore throat.   Eyes: Negative for photophobia and visual disturbance.  Respiratory: Negative for cough, chest tightness, shortness of breath and wheezing.   Cardiovascular: Negative for chest pain and palpitations.  Gastrointestinal: Negative for abdominal pain, blood in stool, constipation, diarrhea, nausea and vomiting.  Genitourinary: Negative for dysuria, hematuria and urgency.  Musculoskeletal: Positive for back pain and neck pain. Negative for myalgias.  Skin: Negative for rash.  Neurological: Negative for dizziness, weakness and light-headedness.     Physical Exam Updated Vital Signs BP (!) 177/103 (BP Location: Right Arm)   Pulse 75   Temp 97.6 F (36.4 C) (  Oral)   Resp 13   Ht 5\' 8"  (1.727 m)   SpO2 100%   BMI 25.45 kg/m   Physical Exam  Constitutional: He appears well-developed and well-nourished. No distress.  HENT:  Head: Normocephalic and atraumatic.  Nose: Nose normal.  Eyes: Conjunctivae and EOM are normal. Right eye exhibits no discharge. Left eye exhibits no discharge. No scleral icterus.  Neck: Normal range of motion. Neck supple. Muscular tenderness present. No neck rigidity. Normal range of motion present.    Cardiovascular: Normal rate, regular rhythm, normal heart sounds and intact distal pulses. Exam reveals no gallop and no friction rub.  No murmur heard. Pulmonary/Chest: Effort normal and breath sounds normal. No respiratory distress.  Abdominal: Soft. Bowel sounds are normal. He exhibits no distension. There is no tenderness. There is no guarding.  Musculoskeletal: Normal range of motion. He exhibits no edema.       Back:  Tenderness to palpation of the indicated area.  Well-healing surgical scar noted over area.  Patient reports "different" sensation to right lower extremity to light touch.  No midline spinal tenderness present in thoracic or cervical spine. No step-off  palpated. No visible bruising, edema or temperature change noted. No objective signs of numbness present. No saddle anesthesia. 2+ DP pulses bilaterally. Sensation intact to light touch. Strength 5/5 in bilateral lower extremities.  Neurological: He is alert. He exhibits normal muscle tone. Coordination normal.  Skin: Skin is warm and dry. No rash noted.  Psychiatric: He has a normal mood and affect.  Nursing note and vitals reviewed.    ED Treatments / Results  Labs (all labs ordered are listed, but only abnormal results are displayed) Labs Reviewed  BASIC METABOLIC PANEL - Abnormal; Notable for the following components:      Result Value   Glucose, Bld 234 (*)    All other components within normal limits  CBC WITH DIFFERENTIAL/PLATELET    EKG None  Radiology Mr Lumbar Spine W Wo Contrast  Result Date: 12/21/2017 CLINICAL DATA:  Chronic back pain, acutely radiating to the left leg 2 days ago. History of prior surgery. EXAM: MRI LUMBAR SPINE WITHOUT AND WITH CONTRAST TECHNIQUE: Multiplanar and multiecho pulse sequences of the lumbar spine were obtained without and with intravenous contrast. CONTRAST:  7 mL Gadavist intravenous contrast. COMPARISON:  CT abdomen pelvis dated June 16, 2016. MRI lumbar spine dated June 08, 2013. FINDINGS: Segmentation:  Standard. Alignment:  Unchanged 4 mm anterolisthesis at L4-L5. Vertebrae: Prior L4-L5 PLIF. No fracture, evidence of discitis, or focal bone lesion. Conus medullaris and cauda equina: Conus extends to the T12-L1 level. Conus and cauda equina appear normal. No intradural enhancement. Paraspinal and other soft tissues: Small bilateral renal cysts. Old bone harvest graft site of the left posterior iliac bone. Disc levels: T11-T12: Only seen on the sagittal images. Unchanged mild disc bulging. No stenosis. T12-L1:  Only seen on the sagittal images.  Negative. L1-L2:  Negative. L2-L3:  Minimal diffuse disc bulge.  No stenosis. L3-L4: Progressive  small diffuse disc bulge, prominent ligamentum flavum hypertrophy, and mild bilateral facet arthropathy resulting in new moderate to severe central spinal canal stenosis and moderate bilateral neuroforaminal stenosis. L4-L5:  Prior PLIF.  No residual or recurrent stenosis. L5-S1: Unchanged broad-based central disc protrusion and annular fissure. Unchanged mild bilateral facet arthropathy. Moderate right and mild left lateral recess stenosis. No spinal canal or neuroforaminal stenosis. IMPRESSION: 1. Prior L4-L5 PLIF with progressive adjacent segment degenerative disease at L3-L4 with new moderate to severe  central spinal canal stenosis and moderate bilateral neuroforaminal stenosis. 2. Unchanged broad-based central disc protrusion and annular fissure at L5-S1 with moderate right and mild left lateral recess stenosis which could affect either S1 nerve root. Electronically Signed   By: Titus Dubin M.D.   On: 12/21/2017 14:58    Procedures Procedures (including critical care time)  Medications Ordered in ED Medications  acetaminophen (TYLENOL) tablet 650 mg (650 mg Oral Given 12/21/17 1219)  cyclobenzaprine (FLEXERIL) tablet 10 mg (10 mg Oral Given 12/21/17 1219)  gadobutrol (GADAVIST) 1 MMOL/ML injection 7 mL (7 mLs Intravenous Contrast Given 12/21/17 1445)  oxyCODONE-acetaminophen (PERCOCET/ROXICET) 5-325 MG per tablet 1 tablet (1 tablet Oral Given 12/21/17 1526)     Initial Impression / Assessment and Plan / ED Course  I have reviewed the triage vital signs and the nursing notes.  Pertinent labs & imaging results that were available during my care of the patient were reviewed by me and considered in my medical decision making (see chart for details).     57 year old male with past medical history of cocaine abuse, arthritis, diabetes, neuropathy, hypertension, status post L4-L5 fusion in 2016 who presents to ED for multiple complaints. 1.  Lower back pain.  States that the pain is located  in the middle of his lower back.  Is been going on for 3 days.  He has had a history of "tingling" to the entire right side of his body for the past several months for which he is being evaluated by neurology for.  States that he has feelings of urinary urgency as well.  Denies any fever, injuries or falls, loss of sensation of bowels or bladder.  States that the pain feels similar to the symptoms that he had prior to his surgery.  On exam there is tenderness to palpation at the midline of the lumbar spine as noted in the image.  Reports "different" sensation to right lower extremity compared to left to light touch.  He is ambulatory.  He is afebrile.  However, due to his risk factors, MRI of the lumbar spine was obtained.  He does have a history of possible abscess from the procedure in his lumbar spine.  MRI shows degenerative changes with stenosis.  He remains ambulatory.  Will advise him to follow-up with his neurosurgeon. 2.  Neck pain.  He has had neck pain for the past day.  He states that he sleeps in a [place] and is unsure if he has slept in a different position.  On exam there is tenderness palpation of bilateral paraspinal musculature, with trapezius muscle spasm noted.  No stiffness, meningismus, history of fever or rash noted.  No history of trauma to the area.  Doubt meningitis.  Will treat for possible torticollis with muscle relaxer and anti-inflammatories.  Advised him to return to ED for any severe worsening symptoms.  Portions of this note were generated with [employer]. Dictation errors may occur despite best attempts at proofreading.   Final Clinical Impressions(s) / ED Diagnoses   Final diagnoses:  Degenerative disc disease, lumbar  Torticollis, acute    ED Discharge Orders         Ordered    cyclobenzaprine (FLEXERIL) 10 MG tablet  2 times daily PRN     12/21/17 1539    naproxen (NAPROSYN) 500 MG tablet  2 times daily     12/21/17 [place],  Nahum Sherrer, PA-C 12/21/17 1545  Daleen Bo, MD 12/21/17 2145

## 2017-12-21 NOTE — ED Notes (Signed)
Warm blanket and drink given to pt.  Asked pt again for urine specimen

## 2017-12-21 NOTE — Discharge Instructions (Addendum)
Return to ED for worsening symptoms, injuries or falls, losing sensation of your bowels or bladder, fever, chest pain.

## 2017-12-21 NOTE — ED Notes (Signed)
Pt reports chronic back pain, he started to have radiating pain to his L leg x 2 days ago.  He endorses urinary frequency but denies incontinence or tingling and numbness.  He also reports waking up with torticollis x 2 days ago.  Hx of back surgeries.

## 2017-12-21 NOTE — ED Notes (Signed)
Per Hina EDPA pt will need an MRI d/t extensive back hx.

## 2017-12-21 NOTE — ED Triage Notes (Signed)
Pt. Stated, David Wyatt had back surgery a year and all my pain is back. My neck and back, I can't hardly move my neck.

## 2017-12-21 NOTE — ED Notes (Signed)
Warm compress placed on pt's R side of neck and shoulder for comfort.  Waiting for another blood result then MRI

## 2017-12-21 NOTE — ED Notes (Signed)
Patient transported to MRI 

## 2017-12-21 NOTE — ED Notes (Signed)
Pt is back from MRI.

## 2018-01-03 ENCOUNTER — Encounter (HOSPITAL_COMMUNITY): Payer: Self-pay | Admitting: Family Medicine

## 2018-01-03 ENCOUNTER — Ambulatory Visit (HOSPITAL_COMMUNITY)
Admission: EM | Admit: 2018-01-03 | Discharge: 2018-01-03 | Disposition: A | Discharge status: Home / Self Care | Payer: Medicaid Other | Attending: Family Medicine | Admitting: Family Medicine

## 2018-01-03 ENCOUNTER — Other Ambulatory Visit: Payer: Self-pay

## 2018-01-03 DIAGNOSIS — R202 Paresthesia of skin: Secondary | ICD-10-CM | POA: Diagnosis not present

## 2018-01-03 DIAGNOSIS — M5010 Cervical disc disorder with radiculopathy, unspecified cervical region: Secondary | ICD-10-CM

## 2018-01-03 DIAGNOSIS — E119 Type 2 diabetes mellitus without complications: Secondary | ICD-10-CM

## 2018-01-03 LAB — GLUCOSE, CAPILLARY: Glucose-Capillary: 188 mg/dL — ABNORMAL HIGH (ref 70–99)

## 2018-01-03 MED ORDER — PREDNISONE 20 MG PO TABS: 20.0000 mg | ORAL_TABLET | Freq: Every day | ORAL | 0 refills | Status: DC

## 2018-01-03 NOTE — ED Provider Notes (Signed)
David Wyatt    CSN: [id number] Arrival date & time: 01/03/18  1327     History   Chief Complaint Chief Complaint  Patient presents with  . Torticollis    HPI David Wyatt is a 57 y.o. male.   This is an initial urgent care visit for this patient  The patient presented to the [facility] with a complaint of neck stiffness and tingling in his right arm and hand. The patient reported that he has not checked his CBG in months.  Discomfort has been worse the last several days     Past Medical History:  Diagnosis Date  . Angioedema of lips 07/28/2012   left upper (07/29/2012)  . Arthritis    hands and back  . Chronic back pain   . Cocaine abuse ([place])   . Diabetic neuropathy ([place])   . High cholesterol   . Hypertension   . Neuropathy   . Type II diabetes mellitus [facility])     Patient Active Problem List   Diagnosis Date Noted  . Hyperlipidemia 04/24/2017  . GERD (gastroesophageal reflux disease) 06/13/2015  . Controlled type 2 diabetes mellitus with complication, without long-term current use of insulin ([place]) 05/01/2015  . Diabetic neuropathy ([place]) 05/01/2015  . Essential hypertension, benign 11/01/2013  . Lumbar disc herniation 09/13/2013  . Unspecified constipation 09/13/2013  . Diabetes mellitus due to underlying condition without complications ([place]) 25/42/7062  . Chronic back pain 09/13/2013  . Viral gastroenteritis 05/05/2013  . Hypoglycemia 05/05/2013  . Felon of finger of left hand with lymphangitis 12/13/2012  . Hyperglycemia 07/29/2012  . Angioedema of lips 07/29/2012  . Crack cocaine use 07/29/2012  . Arthritis 07/29/2012  . Weakness generalized 07/29/2012    Past Surgical History:  Procedure Laterality Date  . BACK SURGERY    . HERNIA REPAIR Right 04/01/2012  . I&D EXTREMITY Left 12/13/2012   Procedure: IRRIGATION AND DEBRIDEMENT LEFT THUMB;  Surgeon: Tennis Must, MD;  Location: [place];  Service: Orthopedics;  Laterality: Left;  . INCISION AND  DRAINAGE OF WOUND     boil on back/notes 07/14/2008 (07/29/2012)  . INGUINAL HERNIA REPAIR  04/01/2012   Procedure: HERNIA REPAIR INGUINAL ADULT;  Surgeon: Haywood Lasso, MD;  Location: [place];  Service: General;  Laterality: Right;  . INGUINAL HERNIA REPAIR Left 09/06/2016   Procedure: OPEN REPAIR LEFT INGUINAL HERNIA;  Surgeon: Greer Pickerel, MD;  Location: [place];  Service: General;  Laterality: Left;  . INSERTION OF MESH Left 09/06/2016   Procedure: INSERTION OF MESH;  Surgeon: Greer Pickerel, MD;  Location: [place];  Service: General;  Laterality: Left;  . TONSILLECTOMY         Home Medications    Prior to Admission medications   Medication Sig Start Date End Date Taking? Authorizing Provider  amLODipine (NORVASC) 2.5 MG tablet Take 1 tablet (2.5 mg total) by mouth daily. 04/24/17   [name], MD  atorvastatin (LIPITOR) 40 MG tablet Take 1 tablet (40 mg total) by mouth daily. 04/24/17   [name], MD  cyclobenzaprine (FLEXERIL) 10 MG tablet Take 1 tablet (10 mg total) by mouth 2 (two) times daily as needed for muscle spasms. 12/21/17   Khatri, Hina, PA-C  DULoxetine (CYMBALTA) 60 MG capsule Take 1 capsule (60 mg total) by mouth daily. 06/05/17   [name], MD  fluticasone (FLONASE) 50 MCG/ACT nasal spray Place 2 sprays into both nostrils daily. 06/03/17   [name], MD  gabapentin (NEURONTIN) 300 MG capsule Take  2 capsules (600 mg total) by mouth 3 (three) times daily. 06/05/17   [name], MD  glipiZIDE (GLUCOTROL XL) 10 MG 24 hr tablet Take 1 tablet (10 mg total) by mouth daily with breakfast. 06/05/17   [name], MD  lidocaine (LIDODERM) 5 % Place 1 patch onto the skin daily. Remove & Discard patch within 12 hours or as directed by MD 06/05/17   [name], MD  loratadine (CLARITIN) 10 MG tablet Take 1 tablet (10 mg total) by mouth daily. 06/03/17   [name], MD  meclizine (ANTIVERT) 25 MG tablet Take 1 tablet (25 mg total) by mouth 3 (three)  times daily as needed for dizziness. 06/03/17   [name], MD  metFORMIN (GLUCOPHAGE) 500 MG tablet Take 2 tablets (1,000 mg total) by mouth 2 (two) times daily with a meal. 04/24/17   [name], MD  methocarbamol (ROBAXIN) 500 MG tablet Take 2 tablets (1,000 mg total) by mouth 2 (two) times daily as needed (Pain). 04/24/17   [name], MD  naproxen (NAPROSYN) 500 MG tablet Take 1 tablet (500 mg total) by mouth 2 (two) times daily. 12/21/17   Khatri, Hina, PA-C  predniSONE (DELTASONE) 20 MG tablet Take 1 tablet (20 mg total) by mouth daily with breakfast. Two daily with food 01/03/18   Robyn Haber, MD    Family History Family History  Problem Relation Age of Onset  . Kidney disease Mother   . Diabetes Father     Social History Social History   Tobacco Use  . Smoking status: Current Every Day Smoker    Packs/day: 0.25    Years: 30.00    Pack years: 7.50    Types: Cigarettes  . Smokeless tobacco: Former Systems developer    Quit date: 08/15/2015  . Tobacco comment: 04/29/16 2  cigs daily, 10/27/17 sometimes < .25 PPD  Substance Use Topics  . Alcohol use: No    Alcohol/week: 0.0 standard drinks  . Drug use: Yes    Types: "Crack" cocaine    Comment: last use 08/23/16     Allergies   Lisinopril   Review of Systems Review of Systems   Physical Exam Triage Vital Signs ED Triage Vitals [01/03/18 1412]  Enc Vitals Group     BP (!) 145/84     Pulse Rate 82     Resp 18     Temp 98 F (36.7 C)     Temp Source Oral     SpO2 98 %     Weight      Height      Head Circumference      Peak Flow      Pain Score      Pain Loc      Pain Edu?      Excl. in [place]?    No data found.  Updated Vital Signs BP (!) 145/84 (BP Location: Right Arm)   Pulse 82   Temp 98 F (36.7 C) (Oral)   Resp 18   SpO2 98%    Physical Exam  Constitutional: He is oriented to person, place, and time. He appears well-developed and well-nourished.  HENT:  Right Ear: External ear normal.    Left Ear: External ear normal.  edentulous  Eyes: Conjunctivae are normal.  Neck: Normal range of motion. Neck supple.  nontender  Cardiovascular: Normal rate, regular rhythm and normal heart sounds.  Pulmonary/Chest: Effort normal and breath sounds normal.  Musculoskeletal: Normal range of motion. He exhibits no tenderness.  Neurological: He is alert and oriented to person, place, and time. No cranial nerve deficit.  Skin: Skin is warm and dry.  Nursing note and vitals reviewed.    UC Treatments / Results  Labs (all labs ordered are listed, but only abnormal results are displayed) Labs Reviewed  GLUCOSE, CAPILLARY - Abnormal; Notable for the following components:      Result Value   Glucose-Capillary 188 (*)    All other components within normal limits    EKG None  Radiology No results found.  Procedures Procedures (including critical care time)  Medications Ordered in UC Medications - No data to display  Initial Impression / Assessment and Plan / UC Course  I have reviewed the triage vital signs and the nursing notes.  Pertinent labs & imaging results that were available during my care of the patient were reviewed by me and considered in my medical decision making (see chart for details).    Final Clinical Impressions(s) / UC Diagnoses   Final diagnoses:  Cervical disc disorder with radiculopathy of cervical region     Discharge Instructions     Monitor your blood sugar.  Avoid sweets.  Return if neck and arm pain are not improving in 3 days  Go to [place] for the cyst on your back    ED Prescriptions    Medication Sig Dispense Auth. Provider   predniSONE (DELTASONE) 20 MG tablet Take 1 tablet (20 mg total) by mouth daily with breakfast. Two daily with food 5 tablet Robyn Haber, MD     Controlled Substance Prescriptions  Controlled Substance Registry consulted? Not Applicable   Robyn Haber, MD 01/03/18 1441

## 2018-01-03 NOTE — Discharge Instructions (Addendum)
Monitor your blood sugar.  Avoid sweets.  Return if neck and arm pain are not improving in 3 days  Go to [place] for the cyst on your back

## 2018-01-03 NOTE — ED Triage Notes (Signed)
The patient presented to the [facility] with a complaint of neck stiffness and tingling in his right arm and hand. The patient reported that he has not checked his CBG in months.

## 2018-01-04 ENCOUNTER — Encounter (HOSPITAL_COMMUNITY): Payer: Self-pay

## 2018-01-04 ENCOUNTER — Emergency Department (HOSPITAL_COMMUNITY)
Admission: EM | Admit: 2018-01-04 | Discharge: 2018-01-04 | Disposition: A | Discharge status: Home / Self Care | Payer: Medicaid Other | Attending: Emergency Medicine | Admitting: Emergency Medicine

## 2018-01-04 DIAGNOSIS — R112 Nausea with vomiting, unspecified: Secondary | ICD-10-CM

## 2018-01-04 DIAGNOSIS — R197 Diarrhea, unspecified: Secondary | ICD-10-CM | POA: Diagnosis not present

## 2018-01-04 DIAGNOSIS — F17210 Nicotine dependence, cigarettes, uncomplicated: Secondary | ICD-10-CM | POA: Diagnosis not present

## 2018-01-04 DIAGNOSIS — I10 Essential (primary) hypertension: Secondary | ICD-10-CM | POA: Insufficient documentation

## 2018-01-04 DIAGNOSIS — E1140 Type 2 diabetes mellitus with diabetic neuropathy, unspecified: Secondary | ICD-10-CM | POA: Diagnosis not present

## 2018-01-04 LAB — COMPREHENSIVE METABOLIC PANEL
ALT: 13 U/L (ref 0–44)
ANION GAP: 8 (ref 5–15)
AST: 19 U/L (ref 15–41)
Albumin: 3.5 g/dL (ref 3.5–5.0)
Alkaline Phosphatase: 92 U/L (ref 38–126)
BILIRUBIN TOTAL: 0.9 mg/dL (ref 0.3–1.2)
BUN: 17 mg/dL (ref 6–20)
CO2: 23 mmol/L (ref 22–32)
Calcium: 9 mg/dL (ref 8.9–10.3)
Chloride: 105 mmol/L (ref 98–111)
Creatinine, Ser: 1.05 mg/dL (ref 0.61–1.24)
GFR calc Af Amer: >60 mL/min (ref >60)
Glucose, Bld: 174 mg/dL — ABNORMAL HIGH (ref 70–99)
POTASSIUM: 4.4 mmol/L (ref 3.5–5.1)
Sodium: 136 mmol/L (ref 135–145)
TOTAL PROTEIN: 7.4 g/dL (ref 6.5–8.1)

## 2018-01-04 LAB — CBC
HCT: 48.2 % (ref 39.0–52.0)
HEMOGLOBIN: 15.5 g/dL (ref 13.0–17.0)
MCH: 30.6 pg (ref 26.0–34.0)
MCHC: 32.2 g/dL (ref 30.0–36.0)
MCV: 95.3 fL (ref 80.0–100.0)
Platelets: 377 10*3/uL (ref 150–400)
RBC: 5.06 MIL/uL (ref 4.22–5.81)
RDW: 13 % (ref 11.5–15.5)
WBC: 14 10*3/uL — ABNORMAL HIGH (ref 4.0–10.5)
nRBC: 0 % (ref 0.0–0.2)

## 2018-01-04 LAB — URINALYSIS, ROUTINE W REFLEX MICROSCOPIC
BILIRUBIN URINE: NEGATIVE
GLUCOSE, UA: NEGATIVE mg/dL
Hgb urine dipstick: NEGATIVE
Ketones, ur: NEGATIVE mg/dL
LEUKOCYTES UA: NEGATIVE
Nitrite: NEGATIVE
PROTEIN: NEGATIVE mg/dL
Specific Gravity, Urine: 1.02 (ref 1.005–1.030)
pH: 5 (ref 5.0–8.0)

## 2018-01-04 LAB — LIPASE, BLOOD: LIPASE: 29 U/L (ref 11–51)

## 2018-01-04 LAB — CBG MONITORING, ED: Glucose-Capillary: 147 mg/dL — ABNORMAL HIGH (ref 70–99)

## 2018-01-04 MED ORDER — ONDANSETRON 4 MG PO TBDP: 4.0000 mg | ORAL_TABLET | Freq: Three times a day (TID) | ORAL | 0 refills | Status: DC | PRN

## 2018-01-04 MED ORDER — ONDANSETRON HCL 4 MG/2ML IJ SOLN
4.0000 mg | Freq: Once | INTRAMUSCULAR | Status: AC
  Administered 2018-01-04: 4 mg via INTRAVENOUS
  Filled 2018-01-04: qty 2

## 2018-01-04 MED ORDER — SODIUM CHLORIDE 0.9 % IV BOLUS
1000.0000 mL | Freq: Once | INTRAVENOUS | Status: AC
  Administered 2018-01-04: 1000 mL via INTRAVENOUS

## 2018-01-04 NOTE — ED Notes (Signed)
Pt stable, ambulatory, and verbalizes understanding of d/c instructions.  

## 2018-01-04 NOTE — ED Notes (Signed)
Gave pt Diet Sprite, tolerating well.  Denies nausea.

## 2018-01-04 NOTE — Discharge Instructions (Signed)
1. Medications: Take your home medicines as prescribed including your metformin.  Take Zofran as needed for nausea.  Wait around 20 minutes before eating or drinking after taking this medication. 2. Treatment: rest, drink plenty of fluids, advance diet slowly.  Start with water and broth then advance to bland foods that will not upset your stomach such as crackers, mashed potatoes, and peanut butter. 3. Follow Up: Please followup with your primary doctor in tomorrow as scheduled for reevaluation; Please return to the ER for persistent vomiting, high fevers or worsening symptoms

## 2018-01-04 NOTE — ED Triage Notes (Signed)
Patient reports that he has some nausea and vomiting following eating bag of chocolate last night. Concerned because he is diabetic and hasn't taken metformin for months, NAD

## 2018-01-04 NOTE — ED Notes (Signed)
Pt has urinal at bedside. Pt is aware that we need a urine sample. Unable to give sample at this time. Will try again.

## 2018-01-04 NOTE — ED Notes (Signed)
Pt aware we need a urine specimen. 

## 2018-01-04 NOTE — ED Provider Notes (Signed)
[facility] EMERGENCY DEPARTMENT Provider Note   CSN: [id number] Arrival date & time: 01/04/18  1019     History   Chief Complaint No chief complaint on file.   HPI David Wyatt is a 57 y.o. male with history of cocaine abuse, chronic back pain, hypertension, type 2 diabetes mellitus, peripheral neuropathy presents for evaluation of acute onset, intermittent abdominal pain with nausea, vomiting, and diarrhea since this morning.  He states that yesterday all he had to eat was a large bag of chocolate candy because he could not afford any other food.  This morning when he awoke he endorsed burning pain "deep" in his abdomen.  Pain did not radiate.  Pain was intermittent.  He notes he has had multiple episodes of nonbloody nonbilious emesis and multiple episodes of loose stools.  Denies melena or hematochezia.  He denies urinary symptoms, fevers, chills, chest pain, or shortness of breath.  He has not tried anything for his symptoms.  He states he has not taken his metformin in months because he has not "felt like it".  He states that he frequently eats food from the [employer] because it is free and is unsure if anyone else has had similar symptoms.  The history is provided by the patient.    Past Medical History:  Diagnosis Date  . Angioedema of lips 07/28/2012   left upper (07/29/2012)  . Arthritis    hands and back  . Chronic back pain   . Cocaine abuse ([place])   . Diabetic neuropathy ([place])   . High cholesterol   . Hypertension   . Neuropathy   . Type II diabetes mellitus [facility])     Patient Active Problem List   Diagnosis Date Noted  . Hyperlipidemia 04/24/2017  . GERD (gastroesophageal reflux disease) 06/13/2015  . Controlled type 2 diabetes mellitus with complication, without long-term current use of insulin ([place]) 05/01/2015  . Diabetic neuropathy ([place]) 05/01/2015  . Essential hypertension, benign 11/01/2013  . Lumbar disc herniation 09/13/2013  .  Unspecified constipation 09/13/2013  . Diabetes mellitus due to underlying condition without complications ([place]) 54/00/8676  . Chronic back pain 09/13/2013  . Viral gastroenteritis 05/05/2013  . Hypoglycemia 05/05/2013  . Felon of finger of left hand with lymphangitis 12/13/2012  . Hyperglycemia 07/29/2012  . Angioedema of lips 07/29/2012  . Crack cocaine use 07/29/2012  . Arthritis 07/29/2012  . Weakness generalized 07/29/2012    Past Surgical History:  Procedure Laterality Date  . BACK SURGERY    . HERNIA REPAIR Right 04/01/2012  . I&D EXTREMITY Left 12/13/2012   Procedure: IRRIGATION AND DEBRIDEMENT LEFT THUMB;  Surgeon: Tennis Must, MD;  Location: [facility];  Service: Orthopedics;  Laterality: Left;  . INCISION AND DRAINAGE OF WOUND     boil on back/notes 07/14/2008 (07/29/2012)  . INGUINAL HERNIA REPAIR  04/01/2012   Procedure: HERNIA REPAIR INGUINAL ADULT;  Surgeon: Haywood Lasso, MD;  Location: [place];  Service: General;  Laterality: Right;  . INGUINAL HERNIA REPAIR Left 09/06/2016   Procedure: OPEN REPAIR LEFT INGUINAL HERNIA;  Surgeon: Greer Pickerel, MD;  Location: [facility];  Service: General;  Laterality: Left;  . INSERTION OF MESH Left 09/06/2016   Procedure: INSERTION OF MESH;  Surgeon: Greer Pickerel, MD;  Location: [facility];  Service: General;  Laterality: Left;  . TONSILLECTOMY          Home Medications    Prior to Admission medications   Medication Sig Start Date End Date  Taking? Authorizing Provider  amLODipine (NORVASC) 2.5 MG tablet Take 1 tablet (2.5 mg total) by mouth daily. Patient not taking: Reported on 01/04/2018 04/24/17   [name], MD  atorvastatin (LIPITOR) 40 MG tablet Take 1 tablet (40 mg total) by mouth daily. Patient not taking: Reported on 01/04/2018 04/24/17   [name], MD  cyclobenzaprine (FLEXERIL) 10 MG tablet Take 1 tablet (10 mg total) by mouth 2 (two) times daily as needed for muscle spasms. Patient not taking: Reported on 01/04/2018  12/21/17   Delia Heady, PA-C  DULoxetine (CYMBALTA) 60 MG capsule Take 1 capsule (60 mg total) by mouth daily. Patient not taking: Reported on 01/04/2018 06/05/17   [name], MD  fluticasone (FLONASE) 50 MCG/ACT nasal spray Place 2 sprays into both nostrils daily. Patient not taking: Reported on 01/04/2018 06/03/17   [name], MD  gabapentin (NEURONTIN) 300 MG capsule Take 2 capsules (600 mg total) by mouth 3 (three) times daily. Patient not taking: Reported on 01/04/2018 06/05/17   [name], MD  glipiZIDE (GLUCOTROL XL) 10 MG 24 hr tablet Take 1 tablet (10 mg total) by mouth daily with breakfast. Patient not taking: Reported on 01/04/2018 06/05/17   [name], MD  lidocaine (LIDODERM) 5 % Place 1 patch onto the skin daily. Remove & Discard patch within 12 hours or as directed by MD Patient not taking: Reported on 01/04/2018 06/05/17   [name], MD  loratadine (CLARITIN) 10 MG tablet Take 1 tablet (10 mg total) by mouth daily. Patient not taking: Reported on 01/04/2018 06/03/17   [name], MD  meclizine (ANTIVERT) 25 MG tablet Take 1 tablet (25 mg total) by mouth 3 (three) times daily as needed for dizziness. Patient not taking: Reported on 01/04/2018 06/03/17   [name], MD  metFORMIN (GLUCOPHAGE) 500 MG tablet Take 2 tablets (1,000 mg total) by mouth 2 (two) times daily with a meal. Patient not taking: Reported on 01/04/2018 04/24/17   [name], MD  methocarbamol (ROBAXIN) 500 MG tablet Take 2 tablets (1,000 mg total) by mouth 2 (two) times daily as needed (Pain). Patient not taking: Reported on 01/04/2018 04/24/17   [name], MD  naproxen (NAPROSYN) 500 MG tablet Take 1 tablet (500 mg total) by mouth 2 (two) times daily. Patient not taking: Reported on 01/04/2018 12/21/17   Delia Heady, PA-C  ondansetron (ZOFRAN ODT) 4 MG disintegrating tablet Take 1 tablet (4 mg total) by mouth every 8 (eight) hours as needed for nausea or vomiting.  01/04/18   Nils Flack, Valeta Paz A, PA-C  predniSONE (DELTASONE) 20 MG tablet Take 1 tablet (20 mg total) by mouth daily with breakfast. Two daily with food Patient not taking: Reported on 01/04/2018 01/03/18   Robyn Haber, MD    Family History Family History  Problem Relation Age of Onset  . Kidney disease Mother   . Diabetes Father     Social History Social History   Tobacco Use  . Smoking status: Current Every Day Smoker    Packs/day: 0.25    Years: 30.00    Pack years: 7.50    Types: Cigarettes  . Smokeless tobacco: Former Systems developer    Quit date: 08/15/2015  . Tobacco comment: 04/29/16 2  cigs daily, 10/27/17 sometimes < .25 PPD  Substance Use Topics  . Alcohol use: No    Alcohol/week: 0.0 standard drinks  . Drug use: Yes    Types: "Crack" cocaine    Comment: last use 08/23/16     Allergies   Lisinopril  Review of Systems Review of Systems  Constitutional: Negative for chills and fever.  Respiratory: Negative for shortness of breath.   Cardiovascular: Negative for chest pain.  Gastrointestinal: Positive for abdominal pain, diarrhea, nausea and vomiting. Negative for constipation.  Genitourinary: Negative for dysuria and hematuria.  All other systems reviewed and are negative.    Physical Exam Updated Vital Signs BP (!) 159/99   Pulse 82   Temp 99.1 F (37.3 C) (Oral)   Resp 16   SpO2 98%   Physical Exam  Constitutional: He appears well-developed and well-nourished. No distress.  Pleasant, resting comfortably in bed  HENT:  Head: Normocephalic and atraumatic.  Eyes: Conjunctivae are normal. Right eye exhibits no discharge. Left eye exhibits no discharge.  Neck: No JVD present. No tracheal deviation present.  Cardiovascular: Normal rate, regular rhythm, normal heart sounds and intact distal pulses.  Pulmonary/Chest: Effort normal and breath sounds normal.  Abdominal: Soft. Bowel sounds are normal. He exhibits no distension and no mass. There is no tenderness.  There is no guarding.  Musculoskeletal: He exhibits no edema.  Neurological: He is alert.  Skin: Skin is warm and dry. No erythema.  Psychiatric: He has a normal mood and affect. His behavior is normal.  Nursing note and vitals reviewed.    ED Treatments / Results  Labs (all labs ordered are listed, but only abnormal results are displayed) Labs Reviewed  COMPREHENSIVE METABOLIC PANEL - Abnormal; Notable for the following components:      Result Value   Glucose, Bld 174 (*)    All other components within normal limits  CBC - Abnormal; Notable for the following components:   WBC 14.0 (*)    All other components within normal limits  CBG MONITORING, ED - Abnormal; Notable for the following components:   Glucose-Capillary 147 (*)    All other components within normal limits  LIPASE, BLOOD  URINALYSIS, ROUTINE W REFLEX MICROSCOPIC    EKG None  Radiology No results found.  Procedures Procedures (including critical care time)  Medications Ordered in ED Medications  sodium chloride 0.9 % bolus 1,000 mL (0 mLs Intravenous Stopped 01/04/18 1503)  ondansetron (ZOFRAN) injection 4 mg (4 mg Intravenous Given 01/04/18 1254)     Initial Impression / Assessment and Plan / ED Course  I have reviewed the triage vital signs and the nursing notes.  Pertinent labs & imaging results that were available during my care of the patient were reviewed by me and considered in my medical decision making (see chart for details).     Patient presenting with nausea, vomiting, and diarrhea after ingesting a large amount of chocolate yesterday.  He is afebrile, mildly hypertensive but this appears to be his baseline.  He is nontoxic in appearance.  Abdomen is soft and nontender on my examination, no peritoneal signs.  Lab work reviewed by me shows mild nonspecific leukocytosis, could be secondary to emesis.  No metabolic derangements.  LFTs, lipase, and creatinine within normal limits.  UA is not  suggestive of UTI or nephrolithiasis.  No glucosuria noted.  Blood sugar today is elevated at 174.  No evidence of DKA.  He was given IV fluids and Zofran in the ED.  On reevaluation he is resting comfortably no apparent distress.  Serial abdominal examinations remain benign and he is tolerating p.o. food and fluids without difficulty.  Doubt acute surgical abdominal pathology including obstruction, perforation, appendicitis, colitis, cholecystitis, or nephrolithiasis.  I do not see need for emergent imaging at  this time given reassuring lab work and benign physical examination.  Will discharge with Zofran.  He has an appointment with his PCP tomorrow.  Discussed strict ED return precautions. Pt verbalized understanding of and agreement with plan and is safe for discharge home at this time.   Final Clinical Impressions(s) / ED Diagnoses   Final diagnoses:  Nausea vomiting and diarrhea    ED Discharge Orders         Ordered    ondansetron (ZOFRAN ODT) 4 MG disintegrating tablet  Every 8 hours PRN     01/04/18 1500           Ashauna Bertholf, [place] A, PA-C 01/04/18 [place], [name], DO 01/04/18 1608

## 2018-02-02 ENCOUNTER — Other Ambulatory Visit: Payer: Self-pay | Admitting: Family Medicine

## 2018-02-02 DIAGNOSIS — M5441 Lumbago with sciatica, right side: Secondary | ICD-10-CM

## 2018-02-02 DIAGNOSIS — I10 Essential (primary) hypertension: Secondary | ICD-10-CM

## 2018-02-02 DIAGNOSIS — G8929 Other chronic pain: Secondary | ICD-10-CM

## 2018-02-02 DIAGNOSIS — M5442 Lumbago with sciatica, left side: Secondary | ICD-10-CM

## 2018-02-02 DIAGNOSIS — E118 Type 2 diabetes mellitus with unspecified complications: Secondary | ICD-10-CM

## 2018-02-02 NOTE — Telephone Encounter (Signed)
1) Medication(s) Requested (by name): metformin bp High cholesteral Muscle relaxer 2) Pharmacy of Choice: [facility]

## 2018-02-03 MED ORDER — METFORMIN HCL 500 MG PO TABS: 1000.0000 mg | ORAL_TABLET | Freq: Two times a day (BID) | ORAL | 0 refills | Status: DC

## 2018-02-03 MED ORDER — METHOCARBAMOL 500 MG PO TABS: 1000.0000 mg | ORAL_TABLET | Freq: Two times a day (BID) | ORAL | 0 refills | Status: DC | PRN

## 2018-02-03 MED ORDER — AMLODIPINE BESYLATE 2.5 MG PO TABS: 2.5000 mg | ORAL_TABLET | Freq: Every day | ORAL | 0 refills | Status: DC

## 2018-02-03 MED ORDER — ATORVASTATIN CALCIUM 40 MG PO TABS: 40.0000 mg | ORAL_TABLET | Freq: Every day | ORAL | 0 refills | Status: DC

## 2018-02-03 MED FILL — ATORVASTATIN CALCIUM 40 MG: 40 | 30 days supply | Qty: 30 | Fill #0

## 2018-02-03 MED FILL — AMLODIPINE 2.5 MG TABLET: 2.5 | 30 days supply | Qty: 30 | Fill #0

## 2018-02-03 MED FILL — metFORMIN HCL 500 MG TABS: 500 | 30 days supply | Qty: 120 | Fill #0

## 2018-02-03 MED FILL — METHOCARBAMOL 500 MG TABS: 500 | 20 days supply | Qty: 80 | Fill #0

## 2018-02-03 NOTE — Telephone Encounter (Signed)
This patient was last seen 06/05/17, he was seen in the ED on 01/04/18 and reported not taking all of his medications. If appropriate please refill.

## 2018-03-07 ENCOUNTER — Emergency Department (HOSPITAL_COMMUNITY)
Admission: EM | Admit: 2018-03-07 | Discharge: 2018-03-07 | Disposition: A | Discharge status: Home / Self Care | Payer: Medicaid Other | Attending: Emergency Medicine | Admitting: Emergency Medicine

## 2018-03-07 ENCOUNTER — Other Ambulatory Visit: Payer: Self-pay

## 2018-03-07 ENCOUNTER — Encounter (HOSPITAL_COMMUNITY): Payer: Self-pay | Admitting: Emergency Medicine

## 2018-03-07 DIAGNOSIS — F17210 Nicotine dependence, cigarettes, uncomplicated: Secondary | ICD-10-CM | POA: Insufficient documentation

## 2018-03-07 DIAGNOSIS — E119 Type 2 diabetes mellitus without complications: Secondary | ICD-10-CM | POA: Diagnosis not present

## 2018-03-07 DIAGNOSIS — R202 Paresthesia of skin: Secondary | ICD-10-CM | POA: Insufficient documentation

## 2018-03-07 DIAGNOSIS — X58XXXA Exposure to other specified factors, initial encounter: Secondary | ICD-10-CM | POA: Insufficient documentation

## 2018-03-07 DIAGNOSIS — M542 Cervicalgia: Secondary | ICD-10-CM

## 2018-03-07 DIAGNOSIS — Y929 Unspecified place or not applicable: Secondary | ICD-10-CM | POA: Diagnosis not present

## 2018-03-07 DIAGNOSIS — Z79899 Other long term (current) drug therapy: Secondary | ICD-10-CM | POA: Insufficient documentation

## 2018-03-07 DIAGNOSIS — R51 Headache: Secondary | ICD-10-CM | POA: Diagnosis not present

## 2018-03-07 DIAGNOSIS — S199XXA Unspecified injury of neck, initial encounter: Secondary | ICD-10-CM | POA: Diagnosis present

## 2018-03-07 DIAGNOSIS — S161XXA Strain of muscle, fascia and tendon at neck level, initial encounter: Secondary | ICD-10-CM | POA: Diagnosis not present

## 2018-03-07 DIAGNOSIS — Y939 Activity, unspecified: Secondary | ICD-10-CM | POA: Insufficient documentation

## 2018-03-07 DIAGNOSIS — Z7984 Long term (current) use of oral hypoglycemic drugs: Secondary | ICD-10-CM | POA: Diagnosis not present

## 2018-03-07 DIAGNOSIS — R3915 Urgency of urination: Secondary | ICD-10-CM | POA: Diagnosis not present

## 2018-03-07 DIAGNOSIS — Y999 Unspecified external cause status: Secondary | ICD-10-CM | POA: Diagnosis not present

## 2018-03-07 DIAGNOSIS — M79604 Pain in right leg: Secondary | ICD-10-CM | POA: Diagnosis not present

## 2018-03-07 DIAGNOSIS — I10 Essential (primary) hypertension: Secondary | ICD-10-CM | POA: Diagnosis not present

## 2018-03-07 LAB — URINALYSIS, ROUTINE W REFLEX MICROSCOPIC
Bilirubin Urine: NEGATIVE
Glucose, UA: NEGATIVE mg/dL
Hgb urine dipstick: NEGATIVE
KETONES UR: 5 mg/dL — AB
LEUKOCYTES UA: NEGATIVE
NITRITE: NEGATIVE
PH: 6 (ref 5.0–8.0)
Protein, ur: NEGATIVE mg/dL
Specific Gravity, Urine: 1.023 (ref 1.005–1.030)

## 2018-03-07 MED ORDER — CYCLOBENZAPRINE HCL 10 MG PO TABS: 10.0000 mg | ORAL_TABLET | Freq: Two times a day (BID) | ORAL | 0 refills | Status: DC | PRN

## 2018-03-07 MED ORDER — CYCLOBENZAPRINE HCL 10 MG PO TABS
10.0000 mg | ORAL_TABLET | Freq: Once | ORAL | Status: AC
  Administered 2018-03-07: 10 mg via ORAL
  Filled 2018-03-07: qty 1

## 2018-03-07 MED ORDER — DEXAMETHASONE 4 MG PO TABS
10.0000 mg | ORAL_TABLET | Freq: Once | ORAL | Status: AC
  Administered 2018-03-07: 10 mg via ORAL
  Filled 2018-03-07: qty 2

## 2018-03-07 NOTE — ED Notes (Signed)
Pt continues to sleep without any noted distress, Resp even, skin warm and dry.

## 2018-03-07 NOTE — ED Triage Notes (Signed)
Pt with bilateral posterior leg pain and neck / back pain x several months.

## 2018-03-07 NOTE — ED Provider Notes (Addendum)
[facility] DEPT Provider Note   CSN: [id number] Arrival date & time: 03/07/18  0800     History   Chief Complaint Chief Complaint  Patient presents with  . Leg Pain  . Neck Pain    HPI David Wyatt is a 57 y.o. male.  HPI   57 year old male with a history of diabetes, hypertension, hyperlipidemia, cocaine abuse, chronic back pain, presents with concern for neck pain and right-sided pain and tingling.  Patient initially reports that the right-sided tingling and painful sensation started 6 months ago, however has outpatient neurology notes for this problem that report that it started December 2018 in January 2019.  He does not have any weakness, change in vision, difficulty speaking and reports that these symptoms of right sided tingling pain have been constant. Reports he has not been taking his gabapentin that was prescribed. Denies trauma, fevers, loss of control of bowels. Reports urinary urgency, feeling like "when I got to go, I've got to go" and sometimes cannot make it to bathroom in time.  No retention.   Dr. Leta [facility]:  "57 y.o. year old male here with new onset right face, arm, leg tingling painful sensation starting December 2018 and January 2019.  Symptoms localized to left brain subcortical region.  Given patient's medical history I would be concerned for left brain subcortical ischemic infarction, lacunar type. However MRI was negative for infarct. "   Past Medical History:  Diagnosis Date  . Angioedema of lips 07/28/2012   left upper (07/29/2012)  . Arthritis    hands and back  . Chronic back pain   . Cocaine abuse ([place])   . Diabetic neuropathy ([place])   . High cholesterol   . Hypertension   . Neuropathy   . Type II diabetes mellitus [facility])     Patient Active Problem List   Diagnosis Date Noted  . Hyperlipidemia 04/24/2017  . GERD (gastroesophageal reflux disease) 06/13/2015  . Controlled type 2 diabetes mellitus with  complication, without long-term current use of insulin ([place]) 05/01/2015  . Diabetic neuropathy ([place]) 05/01/2015  . Essential hypertension, benign 11/01/2013  . Lumbar disc herniation 09/13/2013  . Unspecified constipation 09/13/2013  . Diabetes mellitus due to underlying condition without complications ([place]) 56/81/2751  . Chronic back pain 09/13/2013  . Viral gastroenteritis 05/05/2013  . Hypoglycemia 05/05/2013  . Felon of finger of left hand with lymphangitis 12/13/2012  . Hyperglycemia 07/29/2012  . Angioedema of lips 07/29/2012  . Crack cocaine use 07/29/2012  . Arthritis 07/29/2012  . Weakness generalized 07/29/2012    Past Surgical History:  Procedure Laterality Date  . BACK SURGERY    . HERNIA REPAIR Right 04/01/2012  . I&D EXTREMITY Left 12/13/2012   Procedure: IRRIGATION AND DEBRIDEMENT LEFT THUMB;  Surgeon: Tennis Must, MD;  Location: [place];  Service: Orthopedics;  Laterality: Left;  . INCISION AND DRAINAGE OF WOUND     boil on back/notes 07/14/2008 (07/29/2012)  . INGUINAL HERNIA REPAIR  04/01/2012   Procedure: HERNIA REPAIR INGUINAL ADULT;  Surgeon: Haywood Lasso, MD;  Location: [facility];  Service: General;  Laterality: Right;  . INGUINAL HERNIA REPAIR Left 09/06/2016   Procedure: OPEN REPAIR LEFT INGUINAL HERNIA;  Surgeon: Greer Pickerel, MD;  Location: [facility];  Service: General;  Laterality: Left;  . INSERTION OF MESH Left 09/06/2016   Procedure: INSERTION OF MESH;  Surgeon: Greer Pickerel, MD;  Location: [facility];  Service: General;  Laterality: Left;  . TONSILLECTOMY  Home Medications    Prior to Admission medications   Medication Sig Start Date End Date Taking? Authorizing Provider  amLODipine (NORVASC) 2.5 MG tablet Take 1 tablet (2.5 mg total) by mouth daily. 02/03/18   [name], MD  atorvastatin (LIPITOR) 40 MG tablet Take 1 tablet (40 mg total) by mouth daily. 02/03/18   [name], MD  cyclobenzaprine (FLEXERIL) 10 MG tablet Take 1 tablet (10  mg total) by mouth 2 (two) times daily as needed for muscle spasms. 03/07/18   Gareth Morgan, MD  DULoxetine (CYMBALTA) 60 MG capsule Take 1 capsule (60 mg total) by mouth daily. Patient not taking: Reported on 01/04/2018 06/05/17   [name], MD  fluticasone (FLONASE) 50 MCG/ACT nasal spray Place 2 sprays into both nostrils daily. Patient not taking: Reported on 01/04/2018 06/03/17   [name], MD  gabapentin (NEURONTIN) 300 MG capsule Take 2 capsules (600 mg total) by mouth 3 (three) times daily. Patient not taking: Reported on 01/04/2018 06/05/17   [name], MD  glipiZIDE (GLUCOTROL XL) 10 MG 24 hr tablet Take 1 tablet (10 mg total) by mouth daily with breakfast. Patient not taking: Reported on 01/04/2018 06/05/17   [name], MD  lidocaine (LIDODERM) 5 % Place 1 patch onto the skin daily. Remove & Discard patch within 12 hours or as directed by MD Patient not taking: Reported on 01/04/2018 06/05/17   [name], MD  loratadine (CLARITIN) 10 MG tablet Take 1 tablet (10 mg total) by mouth daily. Patient not taking: Reported on 01/04/2018 06/03/17   [name], MD  meclizine (ANTIVERT) 25 MG tablet Take 1 tablet (25 mg total) by mouth 3 (three) times daily as needed for dizziness. Patient not taking: Reported on 01/04/2018 06/03/17   [name], MD  metFORMIN (GLUCOPHAGE) 500 MG tablet Take 2 tablets (1,000 mg total) by mouth 2 (two) times daily with a meal. 02/03/18   [name], MD  methocarbamol (ROBAXIN) 500 MG tablet Take 2 tablets (1,000 mg total) by mouth 2 (two) times daily as needed (Pain). 02/03/18   [name], MD  naproxen (NAPROSYN) 500 MG tablet Take 1 tablet (500 mg total) by mouth 2 (two) times daily. Patient not taking: Reported on 01/04/2018 12/21/17   Delia Heady, PA-C  ondansetron (ZOFRAN ODT) 4 MG disintegrating tablet Take 1 tablet (4 mg total) by mouth every 8 (eight) hours as needed for nausea or vomiting. 01/04/18   Nils Flack,  [place] A, PA-C  predniSONE (DELTASONE) 20 MG tablet Take 1 tablet (20 mg total) by mouth daily with breakfast. Two daily with food Patient not taking: Reported on 01/04/2018 01/03/18   Robyn Haber, MD    Family History Family History  Problem Relation Age of Onset  . Kidney disease Mother   . Diabetes Father     Social History Social History   Tobacco Use  . Smoking status: Current Every Day Smoker    Packs/day: 0.25    Years: 30.00    Pack years: 7.50    Types: Cigarettes  . Smokeless tobacco: Former Systems developer    Quit date: 08/15/2015  . Tobacco comment: 04/29/16 2  cigs daily, 10/27/17 sometimes < .25 PPD  Substance Use Topics  . Alcohol use: No    Alcohol/week: 0.0 standard drinks  . Drug use: Yes    Types: "Crack" cocaine    Comment: last use 08/23/16     Allergies   Lisinopril   Review of Systems Review of Systems  Constitutional: Negative for fever.  Eyes: Negative for visual disturbance.  Respiratory: Negative for shortness of breath.   Cardiovascular: Negative for chest pain.  Gastrointestinal: Negative for nausea and vomiting.  Musculoskeletal: Positive for arthralgias, back pain, myalgias and neck pain.  Skin: Negative for rash.  Neurological: Positive for numbness (tingling). Negative for syncope, facial asymmetry, speech difficulty, weakness and headaches.     Physical Exam Updated Vital Signs BP (!) 136/94 (BP Location: Left Arm)   Pulse 71   Temp 97.9 F (36.6 C) (Oral)   Resp 16   SpO2 98%   Physical Exam Vitals signs and nursing note reviewed.  Constitutional:      General: He is not in acute distress.    Appearance: He is well-developed. He is not diaphoretic.  HENT:     Head: Normocephalic and atraumatic.  Eyes:     Conjunctiva/sclera: Conjunctivae normal.  Neck:     Musculoskeletal: Normal range of motion.  Cardiovascular:     Rate and Rhythm: Normal rate and regular rhythm.     Heart sounds: Normal heart sounds. No murmur. No  friction rub. No gallop.   Pulmonary:     Effort: Pulmonary effort is normal. No respiratory distress.     Breath sounds: Normal breath sounds. No wheezing or rales.  Musculoskeletal:     Cervical back: He exhibits tenderness. He exhibits no bony tenderness.  Skin:    General: Skin is warm and dry.  Neurological:     Mental Status: He is alert and oriented to person, place, and time.     Sensory: Sensory deficit: reports tingling to right side (chronic)     Motor: No weakness, tremor, abnormal muscle tone or pronator drift.     Coordination: Coordination normal.      ED Treatments / Results  Labs (all labs ordered are listed, but only abnormal results are displayed) Labs Reviewed  URINALYSIS, ROUTINE W REFLEX MICROSCOPIC - Abnormal; Notable for the following components:      Result Value   Ketones, ur 5 (*)    All other components within normal limits    EKG None  Radiology No results found.  Procedures Procedures (including critical care time)  Medications Ordered in ED Medications  dexamethasone (DECADRON) tablet 10 mg (10 mg Oral Given 03/07/18 1331)  cyclobenzaprine (FLEXERIL) tablet 10 mg (10 mg Oral Given 03/07/18 1331)     Initial Impression / Assessment and Plan / ED Course  I have reviewed the triage vital signs and the nursing notes.  Pertinent labs & imaging results that were available during my care of the patient were reviewed by me and considered in my medical decision making (see chart for details).     57 year old male with a history of diabetes, hypertension, hyperlipidemia, cocaine abuse, chronic back pain, presents with concern for neck pain and right-sided pain and tingling.  Patient initially reports that the right-sided tingling and painful sensation started 6 months ago, however has outpatient neurology notes for this problem that report that it started December 2018 in January 2019.  Given chronicity of tingling pain with prior negative MRI  and Neurology evaluation, and no new neurologic symptoms, doubt acute CVA.  Patient has no weakness and denies any urinary retention or stool incontinence fever, trauma, chronic and have low suspicion suspicion for cord compression, fracture, epidural abscess, or vertebral osteomyelitis.  Suspect likely disc disease or cervical muscle strain. UA without infection. Given decadron, flexeril. Patient discharged in stable condition with understanding of reasons to return.  Final Clinical Impressions(s) / ED Diagnoses   Final diagnoses:  Neck pain  Musculoskeletal neck pain  Strain of neck muscle, initial encounter    ED Discharge Orders         Ordered    cyclobenzaprine (FLEXERIL) 10 MG tablet  2 times daily PRN     03/07/18 1402           Gareth Morgan, MD 03/08/18 5625    Gareth Morgan, MD 03/30/18 2237

## 2018-03-07 NOTE — ED Notes (Signed)
Pt come back to hall bed, immediately went to sleep. Will continue to observe. No s/s of distress.

## 2018-06-12 ENCOUNTER — Ambulatory Visit: Payer: Medicaid Other | Admitting: Family Medicine

## 2018-07-24 ENCOUNTER — Other Ambulatory Visit: Payer: Self-pay

## 2018-07-24 ENCOUNTER — Encounter: Payer: Self-pay | Admitting: Family Medicine

## 2018-07-24 ENCOUNTER — Ambulatory Visit: Payer: Medicaid Other | Attending: Family Medicine | Admitting: Family Medicine

## 2018-07-24 ENCOUNTER — Other Ambulatory Visit: Payer: Self-pay | Admitting: Pharmacist

## 2018-07-24 VITALS — BP 145/91 | HR 77 | Temp 98.0°F | Ht 68.0 in | Wt 160.2 lb

## 2018-07-24 DIAGNOSIS — M62830 Muscle spasm of back: Secondary | ICD-10-CM

## 2018-07-24 DIAGNOSIS — M5442 Lumbago with sciatica, left side: Secondary | ICD-10-CM

## 2018-07-24 DIAGNOSIS — G8929 Other chronic pain: Secondary | ICD-10-CM

## 2018-07-24 DIAGNOSIS — I10 Essential (primary) hypertension: Secondary | ICD-10-CM

## 2018-07-24 DIAGNOSIS — E1165 Type 2 diabetes mellitus with hyperglycemia: Secondary | ICD-10-CM | POA: Diagnosis not present

## 2018-07-24 DIAGNOSIS — M5441 Lumbago with sciatica, right side: Secondary | ICD-10-CM

## 2018-07-24 DIAGNOSIS — E7800 Pure hypercholesterolemia, unspecified: Secondary | ICD-10-CM | POA: Diagnosis not present

## 2018-07-24 DIAGNOSIS — E1149 Type 2 diabetes mellitus with other diabetic neurological complication: Secondary | ICD-10-CM

## 2018-07-24 DIAGNOSIS — M542 Cervicalgia: Secondary | ICD-10-CM

## 2018-07-24 LAB — POCT GLYCOSYLATED HEMOGLOBIN (HGB A1C): Hemoglobin A1C: 8.2 % — AB (ref 4.0–5.6)

## 2018-07-24 LAB — GLUCOSE, POCT (MANUAL RESULT ENTRY): POC Glucose: 155 mg/dL — AB (ref 70–99)

## 2018-07-24 MED ORDER — METFORMIN HCL 500 MG PO TABS: 1000.0000 mg | ORAL_TABLET | Freq: Two times a day (BID) | ORAL | 0 refills | Status: DC

## 2018-07-24 MED ORDER — TIZANIDINE HCL 4 MG PO TABS: 4.0000 mg | ORAL_TABLET | Freq: Every day | ORAL | 5 refills | Status: DC

## 2018-07-24 MED ORDER — PREDNISONE 20 MG PO TABS: ORAL_TABLET | ORAL | 0 refills | Status: DC

## 2018-07-24 MED ORDER — AMLODIPINE BESYLATE 5 MG PO TABS: 5.0000 mg | ORAL_TABLET | Freq: Every day | ORAL | 5 refills | Status: DC

## 2018-07-24 MED ORDER — TRUE METRIX METER W/DEVICE KIT: PACK | 0 refills | Status: DC

## 2018-07-24 MED ORDER — GLUCOSE BLOOD VI STRP: ORAL_STRIP | 6 refills | Status: DC

## 2018-07-24 MED ORDER — TRUEPLUS LANCETS 28G MISC: 6 refills | Status: DC

## 2018-07-24 MED ORDER — GLIPIZIDE ER 10 MG PO TB24: 10.0000 mg | ORAL_TABLET | Freq: Every day | ORAL | 3 refills | Status: DC

## 2018-07-24 MED ORDER — GABAPENTIN 300 MG PO CAPS: 600.0000 mg | ORAL_CAPSULE | Freq: Three times a day (TID) | ORAL | 3 refills | Status: DC

## 2018-07-24 MED ORDER — ATORVASTATIN CALCIUM 40 MG PO TABS: 40.0000 mg | ORAL_TABLET | Freq: Every day | ORAL | 5 refills | Status: DC

## 2018-07-24 MED FILL — predniSONE 20 MG TABS: 20 | 5 days supply | Qty: 10 | Fill #0

## 2018-07-24 MED FILL — tiZANidine HCL 4 MG TABS: 4 | 30 days supply | Qty: 30 | Fill #0

## 2018-07-24 MED FILL — AMLODIPINE BESYLATE 5 MG TA: 5 | 30 days supply | Qty: 30 | Fill #0

## 2018-07-24 MED FILL — metFORMIN HCL 500 MG TABS: 500 | 30 days supply | Qty: 120 | Fill #0

## 2018-07-24 MED FILL — ACCU-CHEK FASTCLIX LANCETS: 51 days supply | Qty: 102 | Fill #0

## 2018-07-24 MED FILL — ACCU-CHEK GUIDE TEST STRIP: 50 days supply | Qty: 100 | Fill #0

## 2018-07-24 MED FILL — ACCU-CHEK GUIDE W/DEVICE KI: W/DEVICE | 1 days supply | Qty: 1 | Fill #0

## 2018-07-24 MED FILL — ATORVASTATIN CALCIUM 40 MG: 40 | 30 days supply | Qty: 30 | Fill #0

## 2018-07-24 MED FILL — GABAPENTIN 300 MG CAPSULE: 300 | 30 days supply | Qty: 180 | Fill #0

## 2018-07-24 MED FILL — glipiZIDE XL 10 MG TB24: 10 | 30 days supply | Qty: 30 | Fill #0

## 2018-07-24 NOTE — Progress Notes (Signed)
Established Patient Office Visit  Subjective:  Patient ID: David Wyatt, male    DOB: 05-18-1960  Age: 58 y.o. MRN: [id number]  CC: No chief complaint on file.   HPI David Wyatt presents for follow-up of chronic medical conditions including type 2 diabetes which has not been well controlled, hypertension, hypercholesterolemia and patient has had issues with chronic low back pain with radiation down both legs as well as neck pain.  Patient also reports that his back often feels very tight and stiff.  Patient states that his back pain is always about a 6 on a 0-to-10 scale and sometimes greater than a 10.  Back pain occurs in the midline and is stabbing and patient has radiation of sharp pain down both legs.  Patient also occasionally has sensation of tingling in his right arm and face in addition to the radiation of pain/numbness and tingling down the right leg.  He reports he has been seen by neurology in the past and had ED visit at the end of last year secondary to the neck pain and numbness and tingling.  Patient reports that he is taking duloxetine and gabapentin to help with back pain as well as sensation of numbness/tingling.  He is also taken muscle relaxants in the past to help with back pain and muscle spasm.       He reports that he is taking his blood pressure medication daily and has had no headaches or dizziness related to his blood pressure.  He continues to take atorvastatin and denies any increased muscle or joint pain related to use of this medication.  He continues to take his medications for diabetes but admits that he does not monitor his blood sugars regularly.  He occasionally has increased thirst and sometimes urinary frequency when his sugars are higher.  Otherwise he denies any urinary frequency or dysuria and no loss of bowel or bladder function.  Blood sugars are around the 160s fasting and sometimes higher after meals.  Patient overall feels fairly well if it were not for  his chronic back pain issues.  He also does have fatigue related to poor sleep secondary to his chronic neck and back pain.  Past Medical History:  Diagnosis Date  . Angioedema of lips 07/28/2012   left upper (07/29/2012)  . Arthritis    hands and back  . Chronic back pain   . Cocaine abuse ([place])   . Diabetic neuropathy ([place])   . High cholesterol   . Hypertension   . Neuropathy   . Type II diabetes mellitus ([place])     Past Surgical History:  Procedure Laterality Date  . BACK SURGERY    . HERNIA REPAIR Right 04/01/2012  . I&D EXTREMITY Left 12/13/2012   Procedure: IRRIGATION AND DEBRIDEMENT LEFT THUMB;  Surgeon: Tennis Must, MD;  Location: [facility];  Service: Orthopedics;  Laterality: Left;  . INCISION AND DRAINAGE OF WOUND     boil on back/notes 07/14/2008 (07/29/2012)  . INGUINAL HERNIA REPAIR  04/01/2012   Procedure: HERNIA REPAIR INGUINAL ADULT;  Surgeon: Haywood Lasso, MD;  Location: [facility];  Service: General;  Laterality: Right;  . INGUINAL HERNIA REPAIR Left 09/06/2016   Procedure: OPEN REPAIR LEFT INGUINAL HERNIA;  Surgeon: Greer Pickerel, MD;  Location: [facility];  Service: General;  Laterality: Left;  . INSERTION OF MESH Left 09/06/2016   Procedure: INSERTION OF MESH;  Surgeon: Greer Pickerel, MD;  Location: [facility];  Service: General;  Laterality: Left;  .  TONSILLECTOMY      Family History  Problem Relation Age of Onset  . Kidney disease Mother   . Diabetes Father     Social History   Socioeconomic History  . Marital status: Divorced    Spouse name: Not on file  . Number of children: 3  . Years of education: 43  . Highest education level: Not on file  Occupational History    Comment: disabled  Social Needs  . Financial resource strain: Not on file  . Food insecurity:    Worry: Not on file    Inability: Not on file  . Transportation needs:    Medical: Not on file    Non-medical: Not on file  Tobacco Use  . Smoking status: Current Every Day Smoker    Packs/day: 0.25      Years: 30.00    Pack years: 7.50    Types: Cigarettes  . Smokeless tobacco: Former Systems developer    Quit date: 08/15/2015  . Tobacco comment: 04/29/16 2  cigs daily, 10/27/17 sometimes < .25 PPD  Substance and Sexual Activity  . Alcohol use: No    Alcohol/week: 0.0 standard drinks  . Drug use: Yes    Types: "Crack" cocaine    Comment: last use 08/23/16  . Sexual activity: Not on file  Lifestyle  . Physical activity:    Days per week: Not on file    Minutes per session: Not on file  . Stress: Not on file  Relationships  . Social connections:    Talks on phone: Not on file    Gets together: Not on file    Attends religious service: Not on file    Active member of club or organization: Not on file    Attends meetings of clubs or organizations: Not on file    Relationship status: Not on file  . Intimate partner violence:    Fear of current or ex partner: Not on file    Emotionally abused: Not on file    Physically abused: Not on file    Forced sexual activity: Not on file  Other Topics Concern  . Not on file  Social History Narrative   04/29/17   Lives in shelter   Caffeine- a lot of  tea, coffee    Outpatient Medications Prior to Visit  Medication Sig Dispense Refill  . amLODipine (NORVASC) 2.5 MG tablet Take 1 tablet (2.5 mg total) by mouth daily. 30 tablet 0  . atorvastatin (LIPITOR) 40 MG tablet Take 1 tablet (40 mg total) by mouth daily. 30 tablet 0  . cyclobenzaprine (FLEXERIL) 10 MG tablet Take 1 tablet (10 mg total) by mouth 2 (two) times daily as needed for muscle spasms. 20 tablet 0  . DULoxetine (CYMBALTA) 60 MG capsule Take 1 capsule (60 mg total) by mouth daily. (Patient not taking: Reported on 01/04/2018) 30 capsule 3  . fluticasone (FLONASE) 50 MCG/ACT nasal spray Place 2 sprays into both nostrils daily. (Patient not taking: Reported on 01/04/2018) 16 g 0  . gabapentin (NEURONTIN) 300 MG capsule Take 2 capsules (600 mg total) by mouth 3 (three) times daily. (Patient not  taking: Reported on 01/04/2018) 180 capsule 3  . glipiZIDE (GLUCOTROL XL) 10 MG 24 hr tablet Take 1 tablet (10 mg total) by mouth daily with breakfast. (Patient not taking: Reported on 01/04/2018) 30 tablet 3  . lidocaine (LIDODERM) 5 % Place 1 patch onto the skin daily. Remove & Discard patch within 12 hours or as directed by  MD (Patient not taking: Reported on 01/04/2018) 30 patch 0  . loratadine (CLARITIN) 10 MG tablet Take 1 tablet (10 mg total) by mouth daily. (Patient not taking: Reported on 01/04/2018) 30 tablet 0  . meclizine (ANTIVERT) 25 MG tablet Take 1 tablet (25 mg total) by mouth 3 (three) times daily as needed for dizziness. (Patient not taking: Reported on 01/04/2018) 30 tablet 0  . metFORMIN (GLUCOPHAGE) 500 MG tablet Take 2 tablets (1,000 mg total) by mouth 2 (two) times daily with a meal. 120 tablet 0  . methocarbamol (ROBAXIN) 500 MG tablet Take 2 tablets (1,000 mg total) by mouth 2 (two) times daily as needed (Pain). 80 tablet 0  . naproxen (NAPROSYN) 500 MG tablet Take 1 tablet (500 mg total) by mouth 2 (two) times daily. (Patient not taking: Reported on 01/04/2018) 30 tablet 0  . ondansetron (ZOFRAN ODT) 4 MG disintegrating tablet Take 1 tablet (4 mg total) by mouth every 8 (eight) hours as needed for nausea or vomiting. 9 tablet 0  . predniSONE (DELTASONE) 20 MG tablet Take 1 tablet (20 mg total) by mouth daily with breakfast. Two daily with food (Patient not taking: Reported on 01/04/2018) 5 tablet 0   No facility-administered medications prior to visit.     Allergies  Allergen Reactions  . Lisinopril Other (See Comments)    Lip Swelling, angioedema    ROS Review of Systems  Constitutional: Positive for fatigue. Negative for chills and fever.  HENT: Negative for sore throat and trouble swallowing.   Eyes: Negative for photophobia and visual disturbance.  Respiratory: Negative for cough and shortness of breath.   Cardiovascular: Negative for chest pain, palpitations  and leg swelling.  Gastrointestinal: Negative for abdominal pain, blood in stool, constipation, diarrhea and nausea.  Endocrine: Negative for cold intolerance, heat intolerance, polydipsia, polyphagia and polyuria.       Occasional urinary frequency and increased thirst when blood sugars are high but no current symptoms  Genitourinary: Negative for dysuria and flank pain.  Musculoskeletal: Positive for arthralgias, back pain, neck pain and neck stiffness.  Neurological: Positive for numbness. Negative for dizziness and headaches.  Hematological: Negative for adenopathy. Does not bruise/bleed easily.  Psychiatric/Behavioral: Positive for sleep disturbance. Negative for self-injury.      Objective:    Physical Exam  Constitutional: He is oriented to person, place, and time. He appears well-developed and well-nourished.  Neck: Neck supple. No JVD present.  Cardiovascular: Normal rate and regular rhythm.  Abdominal: Soft. There is no abdominal tenderness. There is no rebound and no guarding.  Musculoskeletal:        General: Tenderness present.     Comments: Lumbosacral tenderness to palpation as well as bilateral thoracolumbar paraspinous spasm  Lymphadenopathy:    He has no cervical adenopathy.  Neurological: He is alert and oriented to person, place, and time.  Skin: Skin is warm and dry.  No active skin breakdown on the feet  Psychiatric: He has a normal mood and affect. His behavior is normal.  Nursing note and vitals reviewed.   BP (!) 145/91 (BP Location: Right Arm, Patient Position: Sitting, Cuff Size: Large)   Pulse 77   Temp 98 F (36.7 C) (Oral)   Ht 5' 8"  (1.727 m)   Wt 160 lb 3.2 oz (72.7 kg)   SpO2 97%   BMI 24.36 kg/m  Wt Readings from Last 3 Encounters:  10/27/17 167 lb 6.4 oz (75.9 kg)  06/05/17 180 lb (81.6 kg)  06/03/17 186 lb (  84.4 kg)     Health Maintenance Due  Topic Date Due  . OPHTHALMOLOGY EXAM  10/12/1969  . COLONOSCOPY  10/12/2009  . URINE  MICROALBUMIN  05/22/2017  . HEMOGLOBIN A1C  12/06/2017  . FOOT EXAM  02/07/2018    There are no preventive care reminders to display for this patient.  No results found for: TSH Lab Results  Component Value Date   WBC 14.0 (H) 01/04/2018   HGB 15.5 01/04/2018   HCT 48.2 01/04/2018   MCV 95.3 01/04/2018   PLT 377 01/04/2018   Lab Results  Component Value Date   NA 136 01/04/2018   K 4.4 01/04/2018   CO2 23 01/04/2018   GLUCOSE 174 (H) 01/04/2018   BUN 17 01/04/2018   CREATININE 1.05 01/04/2018   BILITOT 0.9 01/04/2018   ALKPHOS 92 01/04/2018   AST 19 01/04/2018   ALT 13 01/04/2018   PROT 7.4 01/04/2018   ALBUMIN 3.5 01/04/2018   CALCIUM 9.0 01/04/2018   ANIONGAP 8 01/04/2018   Lab Results  Component Value Date   CHOL 164 06/06/2017   Lab Results  Component Value Date   HDL 69 06/06/2017   Lab Results  Component Value Date   LDLCALC 76 06/06/2017   Lab Results  Component Value Date   TRIG 94 06/06/2017   Lab Results  Component Value Date   CHOLHDL 2.4 06/06/2017   Lab Results  Component Value Date   HGBA1C 8.0 06/05/2017      Assessment & Plan:  1. Uncontrolled type 2 diabetes mellitus with hyperglycemia (HCC) Patient's last hemoglobin A1c was 8.0 and discussed A1c goal of 7 or less.  Patient will have CMP, microalbumin creatinine ratio, hemoglobin A1c and lipid panel done at today's visit.  Patient was provided with prescription for glucometer for home monitoring of his blood sugar as he states that his monitor had stopped working.  Patient is provided with refills of medications.  Low carbohydrate diet as well as exercise as tolerated recommended - Comprehensive metabolic panel - Microalbumin/Creatinine Ratio, Urine - HgB A1c - Lipid panel - Glucose (CBG) - Blood Glucose Monitoring Suppl (TRUE METRIX METER) w/Device KIT; Use as directed to check blood sugars up to twice per day  Dispense: 1 kit; Refill: 0 - atorvastatin (LIPITOR) 40 MG tablet; Take  1 tablet (40 mg total) by mouth daily.  Dispense: 30 tablet; Refill: 5 - glipiZIDE (GLUCOTROL XL) 10 MG 24 hr tablet; Take 1 tablet (10 mg total) by mouth daily with breakfast.  Dispense: 30 tablet; Refill: 3 - metFORMIN (GLUCOPHAGE) 500 MG tablet; Take 2 tablets (1,000 mg total) by mouth 2 (two) times daily with a meal.  Dispense: 120 tablet; Refill: 0 - glucose blood (TRUE METRIX BLOOD GLUCOSE TEST) test strip; Use as instructed to check blood sugar up to twice per day  Dispense: 60 each; Refill: 6 - TRUEplus Lancets 28G MISC; Use once or twice per day to check blood sugar  Dispense: 60 each; Refill: 6  2. Chronic bilateral low back pain with bilateral sciatica Gabapentin refilled for patient's chronic low back pain - gabapentin (NEURONTIN) 300 MG capsule; Take 2 capsules (600 mg total) by mouth 3 (three) times daily.  Dispense: 180 capsule; Refill: 3  3. Essential hypertension, benign Continue use of amlodipine.  Patient is not on a ACE inhibitor despite his diabetes as patient has had prior angioedema with the use of lisinopril - amLODipine (NORVASC) 5 MG tablet; Take 1 tablet (5 mg total) by mouth  daily. To lower blood pressure  Dispense: 30 tablet; Refill: 5  4. Pure hypercholesterolemia Patient with hyper cholesterolemia.  Patient is currently on atorvastatin which will be refilled and patient will also have lipid panel done at today's visit.  Patient will have CMP in follow-up of use of statin medication.  5. Neck pain Patient with his usual chronic neck pain and chronic back pain.  Continue duloxetine which will help with chronic pain as well as new prescription for Zanaflex to help with muscle spasm.  Patient may use warm moist heat to the affected areas as well  6. Muscle spasm of back Prescription for tizanidine to take at bedtime as needed for muscle spasm and hopefully this will also help patient with initiation of sleep which can hopefully reduce his fatigue. - tiZANidine  (ZANAFLEX) 4 MG tablet; Take 1 tablet (4 mg total) by mouth at bedtime. As needed for muscle spasm  Dispense: 30 tablet; Refill: 4   An After Visit Summary was printed and given to the patient.  Follow-up: Return in about 3 weeks (around 08/14/2018) for DM/HTN/Back pain.   Antony Blackbird, MD

## 2018-07-25 LAB — COMPREHENSIVE METABOLIC PANEL WITH GFR
ALT: 13 IU/L (ref 0–44)
AST: 15 IU/L (ref 0–40)
Albumin/Globulin Ratio: 1.6 (ref 1.2–2.2)
Albumin: 4.6 g/dL (ref 3.8–4.9)
Alkaline Phosphatase: 105 IU/L (ref 39–117)
BUN/Creatinine Ratio: 17 (ref 9–20)
BUN: 21 mg/dL (ref 6–24)
Bilirubin Total: 0.5 mg/dL (ref 0.0–1.2)
CO2: 21 mmol/L (ref 20–29)
Calcium: 9.9 mg/dL (ref 8.7–10.2)
Chloride: 105 mmol/L (ref 96–106)
Creatinine, Ser: 1.23 mg/dL (ref 0.76–1.27)
GFR calc Af Amer: 74 mL/min/1.73
GFR calc non Af Amer: 64 mL/min/1.73
Globulin, Total: 2.9 g/dL (ref 1.5–4.5)
Glucose: 159 mg/dL — ABNORMAL HIGH (ref 65–99)
Potassium: 4.8 mmol/L (ref 3.5–5.2)
Sodium: 140 mmol/L (ref 134–144)
Total Protein: 7.5 g/dL (ref 6.0–8.5)

## 2018-07-25 LAB — MICROALBUMIN / CREATININE URINE RATIO
Creatinine, Urine: 176.7 mg/dL
Microalb/Creat Ratio: 18 mg/g{creat} (ref 0–29)
Microalbumin, Urine: 31.8 ug/mL

## 2018-07-25 LAB — LIPID PANEL
Chol/HDL Ratio: 3 ratio (ref 0.0–5.0)
Cholesterol, Total: 215 mg/dL — ABNORMAL HIGH (ref 100–199)
HDL: 72 mg/dL
LDL Calculated: 129 mg/dL — ABNORMAL HIGH (ref 0–99)
Triglycerides: 68 mg/dL (ref 0–149)
VLDL Cholesterol Cal: 14 mg/dL (ref 5–40)

## 2018-08-03 ENCOUNTER — Encounter: Payer: Self-pay | Admitting: Family Medicine

## 2018-08-19 ENCOUNTER — Other Ambulatory Visit: Payer: Self-pay

## 2018-08-19 ENCOUNTER — Encounter: Payer: Self-pay | Admitting: Family Medicine

## 2018-08-19 ENCOUNTER — Ambulatory Visit: Payer: Medicaid Other | Attending: Family Medicine | Admitting: Family Medicine

## 2018-08-19 DIAGNOSIS — M5441 Lumbago with sciatica, right side: Secondary | ICD-10-CM | POA: Diagnosis present

## 2018-08-19 DIAGNOSIS — M5442 Lumbago with sciatica, left side: Secondary | ICD-10-CM | POA: Diagnosis not present

## 2018-08-19 DIAGNOSIS — Z720 Tobacco use: Secondary | ICD-10-CM | POA: Insufficient documentation

## 2018-08-19 DIAGNOSIS — G8929 Other chronic pain: Secondary | ICD-10-CM | POA: Diagnosis not present

## 2018-08-19 DIAGNOSIS — M5126 Other intervertebral disc displacement, lumbar region: Secondary | ICD-10-CM

## 2018-08-19 DIAGNOSIS — E1140 Type 2 diabetes mellitus with diabetic neuropathy, unspecified: Secondary | ICD-10-CM | POA: Diagnosis not present

## 2018-08-19 DIAGNOSIS — E7800 Pure hypercholesterolemia, unspecified: Secondary | ICD-10-CM | POA: Diagnosis not present

## 2018-08-19 DIAGNOSIS — Z79899 Other long term (current) drug therapy: Secondary | ICD-10-CM | POA: Insufficient documentation

## 2018-08-19 DIAGNOSIS — I10 Essential (primary) hypertension: Secondary | ICD-10-CM | POA: Diagnosis not present

## 2018-08-19 DIAGNOSIS — Z7984 Long term (current) use of oral hypoglycemic drugs: Secondary | ICD-10-CM | POA: Insufficient documentation

## 2018-08-19 DIAGNOSIS — Z888 Allergy status to other drugs, medicaments and biological substances status: Secondary | ICD-10-CM | POA: Insufficient documentation

## 2018-08-19 MED ORDER — NAPROXEN 500 MG PO TABS: 500.0000 mg | ORAL_TABLET | Freq: Two times a day (BID) | ORAL | 3 refills | Status: DC

## 2018-08-19 MED ORDER — CYCLOBENZAPRINE HCL 10 MG PO TABS: 10.0000 mg | ORAL_TABLET | Freq: Two times a day (BID) | ORAL | 0 refills | Status: DC | PRN

## 2018-08-19 MED ORDER — DULOXETINE HCL 60 MG PO CPEP: 60.0000 mg | ORAL_CAPSULE | Freq: Every day | ORAL | 3 refills | Status: DC

## 2018-08-19 NOTE — Progress Notes (Signed)
Patient has been called and DOB has been verified. Patient has been screened and transferred to PCP to start phone visit.     

## 2018-08-19 NOTE — Progress Notes (Signed)
Virtual Visit via Telephone Note  I connected with David Wyatt, on 08/19/2018 at 10:20 AM by telephone due to the COVID-19 pandemic and verified that I am speaking with the correct person using two identifiers.   Consent: I discussed the limitations, risks, security and privacy concerns of performing an evaluation and management service by telephone and the availability of in person appointments. I also discussed with the patient that there may be a patient responsible charge related to this service. The patient expressed understanding and agreed to proceed.   Location of Patient: Home  Location of Provider: Clinic  Persons participating in Telemedicine visit: Ulyess Blossom Farrington-CMA Dr. Felecia Shelling     History of Present Illness: David Wyatt  is a 58 year old male with a history of type 2 diabetes mellitus (A1c 8.2), diabetic neuropathy, hypertension, chronic low back pain (status post previous back surgery) who comes into the clinic for a follow-up visit.  Today he complains of a 10/10 back pain and describes this as " my back is killing me".  Pain radiates down his left lower extremity it is uncontrolled on gabapentin.  He does not have other additional pain medications to take even though his med list reveals NSAID, muscle relaxant and Cymbalta.  He denies recent falls, loss of sphincteric function but does have intermittent numbness in his legs.  Unsure if pain has been exacerbated by his sleeping on the couch recently. He had his diabetic follow-up with Dr. Chapman Fitch last month.   Past Medical History:  Diagnosis Date  . Angioedema of lips 07/28/2012   left upper (07/29/2012)  . Arthritis    hands and back  . Chronic back pain   . Cocaine abuse ([place])   . Diabetic neuropathy ([place])   . High cholesterol   . Hypertension   . Neuropathy   . Type II diabetes mellitus ([facility])    Allergies  Allergen Reactions  . Lisinopril Other (See Comments)    Lip Swelling,  angioedema    Current Outpatient Medications on File Prior to Visit  Medication Sig Dispense Refill  . amLODipine (NORVASC) 5 MG tablet Take 1 tablet (5 mg total) by mouth daily. To lower blood pressure 30 tablet 5  . atorvastatin (LIPITOR) 40 MG tablet Take 1 tablet (40 mg total) by mouth daily. 30 tablet 5  . Blood Glucose Monitoring Suppl (TRUE METRIX METER) w/Device KIT Use as directed to check blood sugars up to twice per day 1 kit 0  . gabapentin (NEURONTIN) 300 MG capsule Take 2 capsules (600 mg total) by mouth 3 (three) times daily. 180 capsule 3  . glipiZIDE (GLUCOTROL XL) 10 MG 24 hr tablet Take 1 tablet (10 mg total) by mouth daily with breakfast. 30 tablet 3  . glucose blood (TRUE METRIX BLOOD GLUCOSE TEST) test strip Use as instructed to check blood sugar up to twice per day 60 each 6  . metFORMIN (GLUCOPHAGE) 500 MG tablet Take 2 tablets (1,000 mg total) by mouth 2 (two) times daily with a meal. 120 tablet 0  . tiZANidine (ZANAFLEX) 4 MG tablet Take 1 tablet (4 mg total) by mouth at bedtime. As needed for muscle spasm 30 tablet 5  . TRUEplus Lancets 28G MISC Use once or twice per day to check blood sugar 60 each 6  . cyclobenzaprine (FLEXERIL) 10 MG tablet Take 1 tablet (10 mg total) by mouth 2 (two) times daily as needed for muscle spasms. (Patient not taking: Reported on 07/24/2018) 20 tablet 0  .  DULoxetine (CYMBALTA) 60 MG capsule Take 1 capsule (60 mg total) by mouth daily. (Patient not taking: Reported on 01/04/2018) 30 capsule 3  . fluticasone (FLONASE) 50 MCG/ACT nasal spray Place 2 sprays into both nostrils daily. (Patient not taking: Reported on 01/04/2018) 16 g 0  . lidocaine (LIDODERM) 5 % Place 1 patch onto the skin daily. Remove & Discard patch within 12 hours or as directed by MD (Patient not taking: Reported on 01/04/2018) 30 patch 0  . loratadine (CLARITIN) 10 MG tablet Take 1 tablet (10 mg total) by mouth daily. (Patient not taking: Reported on 01/04/2018) 30 tablet 0   . meclizine (ANTIVERT) 25 MG tablet Take 1 tablet (25 mg total) by mouth 3 (three) times daily as needed for dizziness. (Patient not taking: Reported on 01/04/2018) 30 tablet 0  . methocarbamol (ROBAXIN) 500 MG tablet Take 2 tablets (1,000 mg total) by mouth 2 (two) times daily as needed (Pain). (Patient not taking: Reported on 07/24/2018) 80 tablet 0  . naproxen (NAPROSYN) 500 MG tablet Take 1 tablet (500 mg total) by mouth 2 (two) times daily. (Patient not taking: Reported on 01/04/2018) 30 tablet 0  . ondansetron (ZOFRAN ODT) 4 MG disintegrating tablet Take 1 tablet (4 mg total) by mouth every 8 (eight) hours as needed for nausea or vomiting. (Patient not taking: Reported on 07/24/2018) 9 tablet 0  . predniSONE (DELTASONE) 20 MG tablet 2 pill once per day x 5 days; take after eating (Patient not taking: Reported on 08/19/2018) 10 tablet 0   No current facility-administered medications on file prior to visit.     Observations/Objective: Awake, alert, oriented x3 Not in acute distress   CMP Latest Ref Rng & Units 07/24/2018 01/04/2018 12/21/2017  Glucose 65 - 99 mg/dL 159(H) 174(H) 234(H)  BUN 6 - 24 mg/dL 21 17 11  Creatinine 0.76 - 1.27 mg/dL 1.23 1.05 1.11  Sodium 134 - 144 mmol/L 140 136 139  Potassium 3.5 - 5.2 mmol/L 4.8 4.4 3.5  Chloride 96 - 106 mmol/L 105 105 108  CO2 20 - 29 mmol/L 21 23 24  Calcium 8.7 - 10.2 mg/dL 9.9 9.0 9.0  Total Protein 6.0 - 8.5 g/dL 7.5 7.4 -  Total Bilirubin 0.0 - 1.2 mg/dL 0.5 0.9 -  Alkaline Phos 39 - 117 IU/L 105 92 -  AST 0 - 40 IU/L 15 19 -  ALT 0 - 44 IU/L 13 13 -    Lipid Panel     Component Value Date/Time   CHOL 215 (H) 07/24/2018 1157   TRIG 68 07/24/2018 1157   HDL 72 07/24/2018 1157   CHOLHDL 3.0 07/24/2018 1157   CHOLHDL 3.6 05/22/2016 0852   VLDL 32 (H) 05/03/2015 0954   LDLCALC 129 (H) 07/24/2018 1157    Lab Results  Component Value Date   HGBA1C 8.2 (A) 07/24/2018    Assessment and Plan: 1. Chronic bilateral low back  pain with bilateral sciatica Uncontrolled Refilled Cymbalta, naproxen and Flexeril He will benefit from PT-I have referred him Apply heat - DULoxetine (CYMBALTA) 60 MG capsule; Take 1 capsule (60 mg total) by mouth daily.  Dispense: 30 capsule; Refill: 3 - naproxen (NAPROSYN) 500 MG tablet; Take 1 tablet (500 mg total) by mouth 2 (two) times daily with a meal.  Dispense: 60 tablet; Refill: 3 - Ambulatory referral to Physical Therapy - cyclobenzaprine (FLEXERIL) 10 MG tablet; Take 1 tablet (10 mg total) by mouth 2 (two) times daily as needed for muscle spasms.  Dispense: 20   tablet; Refill: 0  2. Lumbar disc herniation See #1 above - DULoxetine (CYMBALTA) 60 MG capsule; Take 1 capsule (60 mg total) by mouth daily.  Dispense: 30 capsule; Refill: 3 - naproxen (NAPROSYN) 500 MG tablet; Take 1 tablet (500 mg total) by mouth 2 (two) times daily with a meal.  Dispense: 60 tablet; Refill: 3 - Ambulatory referral to Physical Therapy - cyclobenzaprine (FLEXERIL) 10 MG tablet; Take 1 tablet (10 mg total) by mouth 2 (two) times daily as needed for muscle spasms.  Dispense: 20 tablet; Refill: 0  3. Tobacco abuse Spent 3 minutes counseling on cessation and hazardous effect of smoking and he is uninterested in quitting at this time.   Follow Up Instructions: 3 months   I discussed the assessment and treatment plan with the patient. The patient was provided an opportunity to ask questions and all were answered. The patient agreed with the plan and demonstrated an understanding of the instructions.   The patient was advised to call back or seek an in-person evaluation if the symptoms worsen or if the condition fails to improve as anticipated.     I provided 16 minutes total of non-face-to-face time during this encounter including median intraservice time, reviewing previous notes, labs, imaging, medications, management and patient verbalized understanding.      , MD, FAAFP. [facility] [place], [place] [phone]   08/19/2018, 10:20 AM  

## 2018-08-26 ENCOUNTER — Ambulatory Visit: Payer: Medicaid Other | Attending: Family Medicine | Admitting: Physical Therapy

## 2018-08-26 ENCOUNTER — Encounter: Payer: Self-pay | Admitting: Physical Therapy

## 2018-08-26 ENCOUNTER — Other Ambulatory Visit: Payer: Self-pay

## 2018-08-26 DIAGNOSIS — M5442 Lumbago with sciatica, left side: Secondary | ICD-10-CM | POA: Diagnosis present

## 2018-08-26 DIAGNOSIS — G8929 Other chronic pain: Secondary | ICD-10-CM | POA: Insufficient documentation

## 2018-08-26 DIAGNOSIS — R2689 Other abnormalities of gait and mobility: Secondary | ICD-10-CM

## 2018-08-26 DIAGNOSIS — M5126 Other intervertebral disc displacement, lumbar region: Secondary | ICD-10-CM | POA: Diagnosis present

## 2018-08-26 DIAGNOSIS — R293 Abnormal posture: Secondary | ICD-10-CM | POA: Diagnosis present

## 2018-08-26 DIAGNOSIS — M5441 Lumbago with sciatica, right side: Secondary | ICD-10-CM | POA: Insufficient documentation

## 2018-08-26 NOTE — Therapy (Signed)
[place], [place], [address] Phone: [phone]   Fax:  [phone]  Physical Therapy Evaluation  Patient Details  Name: David Wyatt MRN: [id number] Date of Birth: 1960/04/03 Referring Provider (PT): [name], MD    Encounter Date: 08/26/2018  PT End of Session - 08/26/18 1536    Visit Number  1    Number of Visits  13    Date for PT Re-Evaluation  10/21/18    PT Start Time  1536    PT Stop Time  1620    PT Time Calculation (min)  44 min    Activity Tolerance  Patient tolerated treatment well    Behavior During Therapy  [facility] for tasks assessed/performed       Past Medical History:  Diagnosis Date  . Angioedema of lips 07/28/2012   left upper (07/29/2012)  . Arthritis    hands and back  . Chronic back pain   . Cocaine abuse ([place])   . Diabetic neuropathy ([place])   . High cholesterol   . Hypertension   . Neuropathy   . Type II diabetes mellitus ([place])     Past Surgical History:  Procedure Laterality Date  . BACK SURGERY    . HERNIA REPAIR Right 04/01/2012  . I&D EXTREMITY Left 12/13/2012   Procedure: IRRIGATION AND DEBRIDEMENT LEFT THUMB;  Surgeon: Tennis Must, MD;  Location: [place];  Service: Orthopedics;  Laterality: Left;  . INCISION AND DRAINAGE OF WOUND     boil on back/notes 07/14/2008 (07/29/2012)  . INGUINAL HERNIA REPAIR  04/01/2012   Procedure: HERNIA REPAIR INGUINAL ADULT;  Surgeon: Haywood Lasso, MD;  Location: [place];  Service: General;  Laterality: Right;  . INGUINAL HERNIA REPAIR Left 09/06/2016   Procedure: OPEN REPAIR LEFT INGUINAL HERNIA;  Surgeon: Greer Pickerel, MD;  Location: [place];  Service: General;  Laterality: Left;  . INSERTION OF MESH Left 09/06/2016   Procedure: INSERTION OF MESH;  Surgeon: Greer Pickerel, MD;  Location: [facility];  Service: General;  Laterality: Left;  . TONSILLECTOMY      There were no vitals filed for this visit.   Subjective Assessment - 08/26/18  1546    Subjective  pt is a 58 y.o M with CC of low back pain that goes down both legs and has been going on for over a year, and reports no specific MOI. pt reports intermittent loss of bladder that has been going on for a while and denies all red flags. since onset he reports the pain has been gradually worsening. He does have hx of a lumbar fusion but is unable to report when it was done.    Limitations  Sitting;Lifting;Standing;Walking;House hold activities    How long can you sit comfortably?  unlimited    How long can you stand comfortably?  maybe 5 min (unable to state)    How long can you walk comfortably?  5-10 min (unable to state)    Diagnostic tests  x-ray, MRI 12/21/2017    Patient Stated Goals  to decrease pain    Currently in Pain?  Yes    Pain Score  6    at worst 10/10   Pain Location  Back    Pain Orientation  Left;Right;Mid    Pain Descriptors / Indicators  Aching;Sore;Tightness;Shooting    Pain Type  Chronic pain    Pain Radiating Towards  down both legs legs R>L    Pain Onset  More  than a month ago    Pain Frequency  Intermittent    Aggravating Factors   prolonged standing, walking,    Pain Relieving Factors  leaning up against the wall.    Effect of Pain on Daily Activities  limited walking/ standing         [facility] PT Assessment - 08/26/18 1536      Assessment   Medical Diagnosis  Chronic bilateral low back pain with bilateral sciatica, Lumbar disc herniation      Referring Provider (PT)  [name], MD     Onset Date/Surgical Date  --   around 1 year ago   Hand Dominance  Right    Next MD Visit  unsure    Prior Therapy  yes      Precautions   Precautions  None      Restrictions   Weight Bearing Restrictions  No      Balance Screen   Has the patient fallen in the past 6 months  No    Has the patient had a decrease in activity level because of a fear of falling?   No    Is the patient reluctant to leave their home because of a fear of falling?    No      Home Film/video editor residence    Living Arrangements  Spouse/significant other    Available Help at Discharge  Family    Type of [place] to enter    Entrance Stairs-Number of Steps  2    Entrance Stairs-Rails  None    Home Layout  Two level    Alternate Level Stairs-Number of Steps  13    Alternate Level Stairs-Rails  Right   ascending   Home Equipment  Cane - single point      Prior Function   Level of Independence  Independent with basic ADLs    Vocation  Unemployed      Cognition   Overall Cognitive Status  Within Functional Limits for tasks assessed      Posture/Postural Control   Posture/Postural Control  Postural limitations    Postural Limitations  Rounded Shoulders;Forward head;Flexed trunk;Increased lumbar lordosis      ROM / Strength   AROM / PROM / Strength  AROM;Strength      AROM   Overall AROM Comments  neutral position is 10 degrees of flexion    AROM Assessment Site  Lumbar    Lumbar Flexion  100   reported pain int he back of the thighs in neutral pos.   Lumbar Extension  13    Lumbar - Right Side Bend  10    Lumbar - Left Side Bend  20      Strength   Strength Assessment Site  Hip;Knee    Right/Left Hip  Right;Left    Right Hip Flexion  4+/5    Right Hip ABduction  4-/5    Right Hip ADduction  4+/5    Left Hip Flexion  4+/5    Left Hip ABduction  4-/5    Left Hip ADduction  4+/5    Right/Left Knee  Right;Left    Right Knee Flexion  5/5    Right Knee Extension  5/5    Left Knee Flexion  5/5    Left Knee Extension  5/5      Palpation   Spinal mobility  hypomobility PAIVM L5-S1 with concodrdant pain noted  during testing    Palpation comment  TTP along R lumbar parapsionals and along the L4-L5 and L5-S1       Special Tests    Special Tests  Lumbar    Lumbar Tests  Slump Test;Prone Knee Bend Test;Straight Leg Raise      Ambulation/Gait   Ambulation/Gait  Yes    Gait Pattern   Step-through pattern;Decreased stride length;Decreased stance time - right;Decreased step length - left;Trendelenburg;Antalgic;Trunk flexed;Wide base of support    Gait Comments  keeps left knee flexed throughout gait                 Objective measurements completed on examination: See above findings.      [facility] Adult PT Treatment/Exercise - 08/26/18 1536      Exercises   Exercises  Lumbar      Lumbar Exercises: Stretches   Lower Trunk Rotation Limitations  2 x 10    Prone on Elbows Stretch  --   2 x 15     Lumbar Exercises: Standing   Other Standing Lumbar Exercises  repeated trunk extension avoiding extreme end ranges 2 x 10      Lumbar Exercises: Supine   Bridge  10 reps             PT Education - 08/26/18 1541    Education Details  evaluation findings, POC, goals, HEP with proper form / rationale, anatomy of the low back and benefits of proper posture avoiding forward flexion position.    Person(s) Educated  Patient    Methods  Explanation;Verbal cues;Handout    Comprehension  Verbalized understanding;Verbal cues required       PT Short Term Goals - 08/26/18 1634      PT SHORT TERM GOAL #1   Title  pt to be I with inital HEP    Baseline  no previous HEP    Time  3    Period  Weeks    Status  New    Target Date  09/16/18      PT SHORT TERM GOAL #2   Title  pt to verbalize and demo proper posture and lifting mechanics to reduce and prevent low back pain    Baseline  no knowledge of HEP    Time  3    Period  Weeks    Status  New    Target Date  09/16/18        PT Long Term Goals - 08/26/18 1635      PT LONG TERM GOAL #1   Title  pt to increase trunk extension by >/= 10 degrees to promote functional mobility with </= 2/10 pain    Baseline  trunk extension 13 with pain at 5/10    Time  6    Period  Weeks    Status  New    Target Date  10/21/18      PT LONG TERM GOAL #2   Title  pt to be able to walk/ stand >/= 30 min for functional  endurance required for community ambulation with </= 2/10 pain    Baseline  5-10 min max for both walking/ standing    Time  6    Period  Weeks    Status  New    Target Date  10/21/18      PT LONG TERM GOAL #3   Title  pt to report decrease bil LE referral for >/= 1 week for functional improvement in symptoms  Baseline  continues bil LE referred pain    Time  6    Period  Weeks    Status  New    Target Date  10/21/18      PT LONG TERM GOAL #4   Title  pt to be I with all HEP given as of last visit to maintain and progress current level of function    Baseline  no previous HEP    Time  6    Period  Weeks    Status  New    Target Date  10/21/18             Plan - 08/26/18 1626    Clinical Impression Statement  pt presents to OPPT with CC of chronic low back pain with bil LE referral. He stays in a forward flexion position staring aorund 10 degreees and demonstrates concordant pain with forward flexion, and limited trunk extension. He noted centralization with repeated extension testing. He has funcitonal strength and demonstrates an antalgic gait with sustained forward flexion and limited stide bil. pt would benefit from physical therapy to decrease low back pain, reduce referred symptoms, promote efficient posture and maximize his overall function by addressing the deficits listed.    Personal Factors and Comorbidities  Age;Comorbidity 3+    Comorbidities  hx of spinal fusion, hx or DM, and neuropathy    Examination-Activity Limitations  Lift;Bend;Sit    Stability/Clinical Decision Making  Evolving/Moderate complexity    Clinical Decision Making  Moderate    Rehab Potential  Good    PT Frequency  2x / week    PT Duration  6 weeks   inital Authorization 1 x a week for 3 weeks.   PT Treatment/Interventions  ADLs/Self Care Home Management;Cryotherapy;Electrical Stimulation;Iontophoresis 4mg /ml Dexamethasone;Moist Heat;Traction;Ultrasound;Therapeutic activities;Therapeutic  exercise;Balance training;Manual techniques;Passive range of motion;Dry needling;Taping    PT Next Visit Plan  review/ update HEP PRN, extension biased (avoid extreme end range due to hx of lumbar fusion L3-L5), core strengthening, posture enducation, gait training, modalitites PRN    PT Home Exercise Plan  prone on elbows, standing trunk extension, lower trunk rotation, bridges    Consulted and Agree with Plan of Care  Patient       Patient will benefit from skilled therapeutic intervention in order to improve the following deficits and impairments:  Abnormal gait, Decreased activity tolerance, Decreased endurance, Decreased range of motion, Decreased strength, Increased muscle spasms, Improper body mechanics, Postural dysfunction, Pain, Increased fascial restricitons  Visit Diagnosis: 1. Lumbar disc herniation   2. Chronic bilateral low back pain with bilateral sciatica   3. Abnormal posture   4. Other abnormalities of gait and mobility        Problem List Patient Active Problem List   Diagnosis Date Noted  . Tobacco abuse 08/19/2018  . Hyperlipidemia 04/24/2017  . GERD (gastroesophageal reflux disease) 06/13/2015  . Controlled type 2 diabetes mellitus with complication, without long-term current use of insulin ([place]) 05/01/2015  . Diabetic neuropathy ([place]) 05/01/2015  . Essential hypertension, benign 11/01/2013  . Lumbar disc herniation 09/13/2013  . Unspecified constipation 09/13/2013  . Diabetes mellitus due to underlying condition without complications ([place]) 66/59/9357  . Chronic back pain 09/13/2013  . Viral gastroenteritis 05/05/2013  . Hypoglycemia 05/05/2013  . Felon of finger of left hand with lymphangitis 12/13/2012  . Hyperglycemia 07/29/2012  . Angioedema of lips 07/29/2012  . Crack cocaine use 07/29/2012  . Arthritis 07/29/2012  . Weakness generalized 07/29/2012   Starr [place] PT,  DPT, LAT, ATC  08/26/18  4:43 PM      [facility] [address] [place], [place], [address] Phone: [phone]   Fax:  [phone]  Name: David Wyatt MRN: [id number] Date of Birth: 11/26/1960

## 2018-09-03 ENCOUNTER — Telehealth: Payer: Self-pay | Admitting: Physical Therapy

## 2018-09-03 ENCOUNTER — Ambulatory Visit: Payer: Medicaid Other | Admitting: Physical Therapy

## 2018-09-03 NOTE — Telephone Encounter (Signed)
called pt regarding missed appointment today, but he did not answer. Unable to leave voicemail due to inbox being full.

## 2018-09-10 ENCOUNTER — Ambulatory Visit: Payer: Medicaid Other | Admitting: Physical Therapy

## 2018-09-16 ENCOUNTER — Other Ambulatory Visit: Payer: Self-pay

## 2018-09-16 ENCOUNTER — Ambulatory Visit: Payer: Medicaid Other | Attending: Family Medicine | Admitting: Physical Therapy

## 2018-09-16 ENCOUNTER — Encounter: Payer: Self-pay | Admitting: Physical Therapy

## 2018-09-16 DIAGNOSIS — M5441 Lumbago with sciatica, right side: Secondary | ICD-10-CM | POA: Diagnosis present

## 2018-09-16 DIAGNOSIS — M5442 Lumbago with sciatica, left side: Secondary | ICD-10-CM | POA: Diagnosis present

## 2018-09-16 DIAGNOSIS — R293 Abnormal posture: Secondary | ICD-10-CM

## 2018-09-16 DIAGNOSIS — M5126 Other intervertebral disc displacement, lumbar region: Secondary | ICD-10-CM

## 2018-09-16 DIAGNOSIS — G8929 Other chronic pain: Secondary | ICD-10-CM

## 2018-09-16 DIAGNOSIS — R2689 Other abnormalities of gait and mobility: Secondary | ICD-10-CM

## 2018-09-16 NOTE — Therapy (Signed)
[place], [place], [address] Phone: [phone]   Fax:  [phone]  Physical Therapy Treatment  Patient Details  Name: David Wyatt MRN: [id number] Date of Birth: 1960-06-18 Referring Provider (PT): [name], MD    Encounter Date: 09/16/2018  PT End of Session - 09/16/18 1623    Visit Number  2    Number of Visits  13    Date for PT Re-Evaluation  10/21/18    Authorization Type  MCD , 3 visits    Authorization Time Period  6/24-7/14/20    Authorization - Visit Number  1    Authorization - Number of Visits  3    PT Start Time  0400    PT Stop Time  9735    PT Time Calculation (min)  45 min       Past Medical History:  Diagnosis Date  . Angioedema of lips 07/28/2012   left upper (07/29/2012)  . Arthritis    hands and back  . Chronic back pain   . Cocaine abuse ([place])   . Diabetic neuropathy ([place])   . High cholesterol   . Hypertension   . Neuropathy   . Type II diabetes mellitus ([place])     Past Surgical History:  Procedure Laterality Date  . BACK SURGERY    . HERNIA REPAIR Right 04/01/2012  . I&D EXTREMITY Left 12/13/2012   Procedure: IRRIGATION AND DEBRIDEMENT LEFT THUMB;  Surgeon: Tennis Must, MD;  Location: [facility];  Service: Orthopedics;  Laterality: Left;  . INCISION AND DRAINAGE OF WOUND     boil on back/notes 07/14/2008 (07/29/2012)  . INGUINAL HERNIA REPAIR  04/01/2012   Procedure: HERNIA REPAIR INGUINAL ADULT;  Surgeon: Haywood Lasso, MD;  Location: [facility];  Service: General;  Laterality: Right;  . INGUINAL HERNIA REPAIR Left 09/06/2016   Procedure: OPEN REPAIR LEFT INGUINAL HERNIA;  Surgeon: Greer Pickerel, MD;  Location: [facility];  Service: General;  Laterality: Left;  . INSERTION OF MESH Left 09/06/2016   Procedure: INSERTION OF MESH;  Surgeon: Greer Pickerel, MD;  Location: [facility];  Service: General;  Laterality: Left;  . TONSILLECTOMY      There were no vitals filed for this  visit.  Subjective Assessment - 09/16/18 1601    Subjective  When I reach it hurts my back.    Currently in Pain?  No/denies    Aggravating Factors   prolonged standing and walking, reaching    Pain Relieving Factors  lean against the wall                       [facility] Adult PT Treatment/Exercise - 09/16/18 0001      Lumbar Exercises: Stretches   Single Knee to Chest Stretch  20 seconds;2 reps    Lower Trunk Rotation Limitations  2 x 10    Standing Extension  10 reps    Prone on Elbows Stretch  10 seconds;60 seconds    Quadruped Mid Back Stretch  1 rep;60 seconds    Quadruped Mid Back Stretch Limitations  with laterals       Lumbar Exercises: Aerobic   Nustep  L5 LE only       Lumbar Exercises: Standing   Other Standing Lumbar Exercises  step and reach up wall with opp arm, gentle side bends in doorway      Lumbar Exercises: Seated   Other Seated Lumbar Exercises  seated lumbar flexion  stretch to floor       Lumbar Exercises: Supine   Bridge  10 reps    Bridge Limitations  causes left calf pain.       Lumbar Exercises: Prone   Straight Leg Raise  10 reps    Straight Leg Raises Limitations  alternating    causes left hamstring pain, no change with pillow               PT Short Term Goals - 09/16/18 1653      PT SHORT TERM GOAL #1   Title  pt to be I with inital HEP    Baseline  performs half of his HEP    Time  3    Period  Weeks    Status  On-going      PT SHORT TERM GOAL #2   Title  pt to verbalize and demo proper posture and lifting mechanics to reduce and prevent low back pain    Baseline  no knowledge of lifting mechanics    Time  3    Period  Weeks    Status  On-going        PT Long Term Goals - 08/26/18 1635      PT LONG TERM GOAL #1   Title  pt to increase trunk extension by >/= 10 degrees to promote functional mobility with </= 2/10 pain    Baseline  trunk extension 13 with pain at 5/10    Time  6    Period  Weeks     Status  New    Target Date  10/21/18      PT LONG TERM GOAL #2   Title  pt to be able to walk/ stand >/= 30 min for functional endurance required for community ambulation with </= 2/10 pain    Baseline  5-10 min max for both walking/ standing    Time  6    Period  Weeks    Status  New    Target Date  10/21/18      PT LONG TERM GOAL #3   Title  pt to report decrease bil LE referral for >/= 1 week for functional improvement in symptoms    Baseline  continues bil LE referred pain    Time  6    Period  Weeks    Status  New    Target Date  10/21/18      PT LONG TERM GOAL #4   Title  pt to be I with all HEP given as of last visit to maintain and progress current level of function    Baseline  no previous HEP    Time  6    Period  Weeks    Status  New    Target Date  10/21/18            Plan - 09/16/18 1611    Clinical Impression Statement  Pt presents after missing first 2 covered treatments he reports due to forgetting his appointments. He is unable to make available appointments in authorization period. He will need ERO at next visit. He reports compliance with 2 out of 4 HEP exercises. He has increased sx with over extension so encouraged again not to over extend. No pain with flexion today. He reports difficulty with reaching overhead though he was unable to reproduce pain in clinic. Seems to have some side bend tightness due to standing with weight to LLE. Added childs pose with lateral which he  stated helped his lumbar relax and did not increase symptoms.    PT Next Visit Plan  ERO; review/ update HEP PRN, extension biased (avoid extreme end range due to hx of lumbar fusion L3-L5), core strengthening, posture enducation, gait training, modalitites PRN    PT Home Exercise Plan  prone on elbows, standing trunk extension, lower trunk rotation, bridges, child pose with laterals       Patient will benefit from skilled therapeutic intervention in order to improve the following  deficits and impairments:  Abnormal gait, Decreased activity tolerance, Decreased endurance, Decreased range of motion, Decreased strength, Increased muscle spasms, Improper body mechanics, Postural dysfunction, Pain, Increased fascial restricitons  Visit Diagnosis: 1. Lumbar disc herniation   2. Chronic bilateral low back pain with bilateral sciatica   3. Abnormal posture   4. Other abnormalities of gait and mobility        Problem List Patient Active Problem List   Diagnosis Date Noted  . Tobacco abuse 08/19/2018  . Hyperlipidemia 04/24/2017  . GERD (gastroesophageal reflux disease) 06/13/2015  . Controlled type 2 diabetes mellitus with complication, without long-term current use of insulin ([place]) 05/01/2015  . Diabetic neuropathy ([place]) 05/01/2015  . Essential hypertension, benign 11/01/2013  . Lumbar disc herniation 09/13/2013  . Unspecified constipation 09/13/2013  . Diabetes mellitus due to underlying condition without complications ([place]) 76/80/8811  . Chronic back pain 09/13/2013  . Viral gastroenteritis 05/05/2013  . Hypoglycemia 05/05/2013  . Felon of finger of left hand with lymphangitis 12/13/2012  . Hyperglycemia 07/29/2012  . Angioedema of lips 07/29/2012  . Crack cocaine use 07/29/2012  . Arthritis 07/29/2012  . Weakness generalized 07/29/2012    Dorene Ar, PTA 09/16/2018, 4:56 PM  [place], [place], [address] Phone: [phone]   Fax:  [phone]  Name: David Wyatt MRN: [id number] Date of Birth: [birth year]

## 2018-09-29 ENCOUNTER — Ambulatory Visit: Payer: Medicaid Other | Admitting: Physical Therapy

## 2018-09-29 ENCOUNTER — Encounter: Payer: Self-pay | Admitting: Physical Therapy

## 2018-09-29 ENCOUNTER — Other Ambulatory Visit: Payer: Self-pay

## 2018-09-29 DIAGNOSIS — R293 Abnormal posture: Secondary | ICD-10-CM

## 2018-09-29 DIAGNOSIS — M5126 Other intervertebral disc displacement, lumbar region: Secondary | ICD-10-CM

## 2018-09-29 DIAGNOSIS — R2689 Other abnormalities of gait and mobility: Secondary | ICD-10-CM

## 2018-09-29 DIAGNOSIS — M5442 Lumbago with sciatica, left side: Secondary | ICD-10-CM

## 2018-09-29 DIAGNOSIS — G8929 Other chronic pain: Secondary | ICD-10-CM

## 2018-09-29 NOTE — Patient Instructions (Signed)

## 2018-09-29 NOTE — Therapy (Addendum)
[place], [place], [address] Phone: [phone]   Fax:  [phone]  Physical Therapy Treatment / Re-evaluation / Discharge summary  Patient Details  Name: David Wyatt MRN: [id number] Date of Birth: 1960-09-29 Referring Provider (PT): [name], MD    Encounter Date: 09/29/2018  PT End of Session - 09/29/18 1605    Visit Number  3    Number of Visits  13    Date for PT Re-Evaluation  10/21/18    Authorization Type  MCD resubmitted on 3 visit    Authorization - Visit Number  1    Authorization - Number of Visits  3    PT Start Time  9371    PT Stop Time  1640    PT Time Calculation (min)  35 min    Activity Tolerance  Patient tolerated treatment well       Past Medical History:  Diagnosis Date  . Angioedema of lips 07/28/2012   left upper (07/29/2012)  . Arthritis    hands and back  . Chronic back pain   . Cocaine abuse ([place])   . Diabetic neuropathy ([place])   . High cholesterol   . Hypertension   . Neuropathy   . Type II diabetes mellitus ([place])     Past Surgical History:  Procedure Laterality Date  . BACK SURGERY    . HERNIA REPAIR Right 04/01/2012  . I&D EXTREMITY Left 12/13/2012   Procedure: IRRIGATION AND DEBRIDEMENT LEFT THUMB;  Surgeon: Tennis Must, MD;  Location: [facility];  Service: Orthopedics;  Laterality: Left;  . INCISION AND DRAINAGE OF WOUND     boil on back/notes 07/14/2008 (07/29/2012)  . INGUINAL HERNIA REPAIR  04/01/2012   Procedure: HERNIA REPAIR INGUINAL ADULT;  Surgeon: Haywood Lasso, MD;  Location: [facility];  Service: General;  Laterality: Right;  . INGUINAL HERNIA REPAIR Left 09/06/2016   Procedure: OPEN REPAIR LEFT INGUINAL HERNIA;  Surgeon: Greer Pickerel, MD;  Location: [facility];  Service: General;  Laterality: Left;  . INSERTION OF MESH Left 09/06/2016   Procedure: INSERTION OF MESH;  Surgeon: Greer Pickerel, MD;  Location: [facility];  Service: General;  Laterality: Left;  .  TONSILLECTOMY      There were no vitals filed for this visit.  Subjective Assessment - 09/29/18 1607    Subjective  " I feel like I am doing better, I was able tie my shoes and I feel like I am walkng better"    Currently in Pain?  Yes    Pain Score  3     Pain Location  Back    Pain Orientation  Right    Pain Descriptors / Indicators  Aching;Sore    Pain Type  Chronic pain    Pain Radiating Towards  down the R leg calf    Pain Onset  More than a month ago    Pain Frequency  Intermittent    Aggravating Factors   lifting up, bending forward    Pain Relieving Factors  exercies, tying shoes         [facility] PT Assessment - 09/29/18 0001      Assessment   Medical Diagnosis  Chronic bilateral low back pain with bilateral sciatica, Lumbar disc herniation      Referring Provider (PT)  [name], MD     Hand Dominance  Right    Next MD Visit  unsure      AROM   Lumbar  Flexion  100    Lumbar Extension  20    Lumbar - Right Side Bend  20    Lumbar - Left Side Bend  20      Strength   Right Hip Flexion  4+/5    Right Hip ABduction  4-/5    Right Hip ADduction  4+/5    Left Hip Flexion  4+/5    Left Hip ABduction  4-/5    Left Hip ADduction  4+/5                   [facility] Adult PT Treatment/Exercise - 09/29/18 0001      Self-Care   Self-Care  Posture    Posture  proper posture and lifting mechanics with assocaited handout      Lumbar Exercises: Stretches   Single Knee to Chest Stretch  2 reps;30 seconds    Lower Trunk Rotation Limitations  2 x 10    Prone on Elbows Stretch Limitations  3 x 30 sec      Lumbar Exercises: Standing   Other Standing Lumbar Exercises  repeated standing trunk extension 2 x 20    Other Standing Lumbar Exercises  standing pulling core tight and reaching into middle cabinet 2 x 10   frequent cues to keep core tight              PT Short Term Goals - 09/29/18 1611      PT SHORT TERM GOAL #1   Title  pt to be I with  inital HEP    Baseline  doing them when he has pain    Time  3    Period  Weeks      PT SHORT TERM GOAL #2   Title  pt to verbalize and demo proper posture and lifting mechanics to reduce and prevent low back pain    Time  3    Period  Weeks        PT Long Term Goals - 09/29/18 1614      PT LONG TERM GOAL #1   Title  pt to increase trunk extension by >/= 10 degrees to promote functional mobility with </= 2/10 pain    Baseline  increased to 20 degrees, intially was 13 degrees    Period  Weeks    Status  On-going    Target Date  10/21/18      PT LONG TERM GOAL #2   Title  pt to be able to walk/ stand >/= 30 min for functional endurance required for community ambulation with </= 2/10 pain    Baseline  reports able stand and walk 15 min pain gets on average around 3/10    Period  Weeks    Status  On-going      PT LONG TERM GOAL #3   Title  pt to report decrease bil LE referral for >/= 1 week for functional improvement in symptoms    Baseline  continues bil LE referred pain R>L    Period  Weeks    Status  On-going      PT LONG TERM GOAL #4   Title  pt to be I with all HEP given as of last visit to maintain and progress current level of function    Baseline  independent with current HEP and progress as able.    Period  Weeks    Status  On-going            Plan -  09/29/18 1637    Clinical Impression Statement  pt reports he has been consistent with his HEP noting improvement in the low back but continues to have bil LE referral with R>L. He was only able to attend 1 out of the 3 approved visits due to complications with transportation. He met all short term goals, is making progress toward his long term goals and demonstrats motivation to continue with treatment to improve his strength and funciton. He would benefit from continued physical therapy to work on core/ hip strength, and maximize his function by addressing the deficits listed and working toward independent  exercise.    Rehab Potential  Good    PT Frequency  2x / week    PT Duration  6 weeks    PT Treatment/Interventions  ADLs/Self Care Home Management;Cryotherapy;Electrical Stimulation;Iontophoresis 70m/ml Dexamethasone;Moist Heat;Traction;Ultrasound;Therapeutic activities;Therapeutic exercise;Balance training;Manual techniques;Passive range of motion;Dry needling;Taping    PT Next Visit Plan  extension biased (avoid extreme end range due to hx of lumbar fusion L3-L5), core strengthening gait training, modalitites PRN, core strengthening with reaching    PT Home Exercise Plan  prone on elbows, standing trunk extension, lower trunk rotation, bridges, child pose with laterals, posture education    Consulted and Agree with Plan of Care  Patient       Patient will benefit from skilled therapeutic intervention in order to improve the following deficits and impairments:  Abnormal gait, Decreased activity tolerance, Decreased endurance, Decreased range of motion, Decreased strength, Increased muscle spasms, Improper body mechanics, Postural dysfunction, Pain, Increased fascial restricitons  Visit Diagnosis: 1. Lumbar disc herniation   2. Chronic bilateral low back pain with bilateral sciatica   3. Abnormal posture   4. Other abnormalities of gait and mobility        Problem List Patient Active Problem List   Diagnosis Date Noted  . Tobacco abuse 08/19/2018  . Hyperlipidemia 04/24/2017  . GERD (gastroesophageal reflux disease) 06/13/2015  . Controlled type 2 diabetes mellitus with complication, without long-term current use of insulin ([facility] 05/01/2015  . Diabetic neuropathy ([facility] [facility] 05/01/2015  . Essential hypertension, benign 11/01/2013  . Lumbar disc herniation 09/13/2013  . Unspecified constipation 09/13/2013  . Diabetes mellitus due to underlying condition without complications ([facility] 032/02/2481 . Chronic back pain 09/13/2013  . Viral gastroenteritis 05/05/2013  . Hypoglycemia 05/05/2013   . [place] of finger of left hand with lymphangitis 12/13/2012  . Hyperglycemia 07/29/2012  . Angioedema of lips 07/29/2012  . Crack cocaine use 07/29/2012  . Arthritis 07/29/2012  . Weakness generalized 07/29/2012   KStarr LakePT, DPT, LAT, ATC  09/29/18  4:41 PM      [facility] [address]GBottineau N[place] [address]Phone: [phone]  Fax:  [phone] Name: GGiovanny DugalMRN: [id number] of Birth: [birth year]      PHYSICAL THERAPY DISCHARGE SUMMARY  Visits from Start of Care: 3  Current functional level related to goals / functional outcomes: See goals   Remaining deficits: Unknown due to pt missing multiple visits   Education / Equipment: HEP  Plan: Patient agrees to discharge.  Patient goals were not met. Patient is being discharged due to not returning since the last visit.  ?????         Mileydi Milsap PT, DPT, LAT, ATC  10/09/18  11:32 AM

## 2018-10-07 ENCOUNTER — Telehealth: Payer: Self-pay | Admitting: Physical Therapy

## 2018-10-07 ENCOUNTER — Ambulatory Visit: Payer: Medicaid Other | Admitting: Physical Therapy

## 2018-10-07 NOTE — Telephone Encounter (Signed)
LVM regarding 2nd missed appointment today at 3:30. With it being his second missed appointment that we can continue to schedule visits but it can only be one at a time per clinic policy. If he cannot make the next scheduled appointment (which day and time was provided) to call us and we can cancel or reschedule this appointment. If he misses 3 visits that is grounds for discharge.

## 2018-10-09 ENCOUNTER — Ambulatory Visit: Payer: Medicaid Other | Admitting: Physical Therapy

## 2018-10-09 ENCOUNTER — Telehealth: Payer: Self-pay | Admitting: Physical Therapy

## 2018-10-09 NOTE — Telephone Encounter (Signed)
Spoke with pt regarding today being his 3rd missed appointment. Discussed the message I left on the last missed appointment that if he missed the next appointment it would make 3 which per clinic policy is grounds for discharge. I state that perhaps if he is having difficulty making it into treatment that perhaps physical therapy isn't appropriate at this point in time. He stated he understood that he was going to be discharged from PT today.

## 2018-10-12 ENCOUNTER — Ambulatory Visit: Payer: Medicaid Other | Admitting: Physical Therapy

## 2018-10-14 ENCOUNTER — Ambulatory Visit: Payer: Medicaid Other | Admitting: Physical Therapy

## 2018-10-19 ENCOUNTER — Ambulatory Visit: Payer: Medicaid Other | Admitting: Physical Therapy

## 2018-10-21 ENCOUNTER — Encounter: Payer: Medicaid Other | Admitting: Physical Therapy

## 2018-10-26 ENCOUNTER — Encounter: Payer: Medicaid Other | Admitting: Physical Therapy

## 2018-10-28 ENCOUNTER — Ambulatory Visit: Payer: Medicaid Other | Admitting: Physical Therapy

## 2018-11-17 ENCOUNTER — Encounter: Payer: Self-pay | Admitting: Family Medicine

## 2018-11-17 ENCOUNTER — Other Ambulatory Visit: Payer: Self-pay

## 2018-11-17 ENCOUNTER — Ambulatory Visit: Payer: Medicaid Other | Attending: Family Medicine | Admitting: Family Medicine

## 2018-11-17 VITALS — BP 126/73 | HR 79 | Temp 98.2°F | Ht 68.0 in | Wt 158.6 lb

## 2018-11-17 DIAGNOSIS — F1411 Cocaine abuse, in remission: Secondary | ICD-10-CM | POA: Insufficient documentation

## 2018-11-17 DIAGNOSIS — E1165 Type 2 diabetes mellitus with hyperglycemia: Secondary | ICD-10-CM | POA: Diagnosis not present

## 2018-11-17 DIAGNOSIS — M62830 Muscle spasm of back: Secondary | ICD-10-CM | POA: Diagnosis not present

## 2018-11-17 DIAGNOSIS — Z23 Encounter for immunization: Secondary | ICD-10-CM | POA: Diagnosis not present

## 2018-11-17 DIAGNOSIS — G8929 Other chronic pain: Secondary | ICD-10-CM | POA: Diagnosis not present

## 2018-11-17 DIAGNOSIS — E1149 Type 2 diabetes mellitus with other diabetic neurological complication: Secondary | ICD-10-CM

## 2018-11-17 DIAGNOSIS — Z888 Allergy status to other drugs, medicaments and biological substances status: Secondary | ICD-10-CM | POA: Diagnosis not present

## 2018-11-17 DIAGNOSIS — M19049 Primary osteoarthritis, unspecified hand: Secondary | ICD-10-CM | POA: Insufficient documentation

## 2018-11-17 DIAGNOSIS — M5441 Lumbago with sciatica, right side: Secondary | ICD-10-CM

## 2018-11-17 DIAGNOSIS — M5442 Lumbago with sciatica, left side: Secondary | ICD-10-CM

## 2018-11-17 DIAGNOSIS — Z0000 Encounter for general adult medical examination without abnormal findings: Secondary | ICD-10-CM

## 2018-11-17 DIAGNOSIS — I10 Essential (primary) hypertension: Secondary | ICD-10-CM

## 2018-11-17 DIAGNOSIS — M5126 Other intervertebral disc displacement, lumbar region: Secondary | ICD-10-CM | POA: Diagnosis not present

## 2018-11-17 DIAGNOSIS — M47819 Spondylosis without myelopathy or radiculopathy, site unspecified: Secondary | ICD-10-CM | POA: Diagnosis not present

## 2018-11-17 DIAGNOSIS — E7800 Pure hypercholesterolemia, unspecified: Secondary | ICD-10-CM | POA: Diagnosis not present

## 2018-11-17 DIAGNOSIS — Z833 Family history of diabetes mellitus: Secondary | ICD-10-CM | POA: Insufficient documentation

## 2018-11-17 DIAGNOSIS — Z791 Long term (current) use of non-steroidal anti-inflammatories (NSAID): Secondary | ICD-10-CM | POA: Diagnosis not present

## 2018-11-17 DIAGNOSIS — Z7984 Long term (current) use of oral hypoglycemic drugs: Secondary | ICD-10-CM | POA: Insufficient documentation

## 2018-11-17 DIAGNOSIS — Z79899 Other long term (current) drug therapy: Secondary | ICD-10-CM | POA: Insufficient documentation

## 2018-11-17 DIAGNOSIS — E1140 Type 2 diabetes mellitus with diabetic neuropathy, unspecified: Secondary | ICD-10-CM | POA: Insufficient documentation

## 2018-11-17 LAB — GLUCOSE, POCT (MANUAL RESULT ENTRY): POC Glucose: 195 mg/dl — AB (ref 70–99)

## 2018-11-17 LAB — POCT GLYCOSYLATED HEMOGLOBIN (HGB A1C): HbA1c, POC (controlled diabetic range): 8.1 % — AB (ref 0.0–7.0)

## 2018-11-17 MED ORDER — GLIPIZIDE ER 10 MG PO TB24: 10.0000 mg | ORAL_TABLET | Freq: Every day | ORAL | 6 refills | Status: DC

## 2018-11-17 MED ORDER — NAPROXEN 500 MG PO TABS: 500.0000 mg | ORAL_TABLET | Freq: Two times a day (BID) | ORAL | 3 refills | Status: DC

## 2018-11-17 MED ORDER — AMLODIPINE BESYLATE 5 MG PO TABS: 5.0000 mg | ORAL_TABLET | Freq: Every day | ORAL | 5 refills | Status: DC

## 2018-11-17 MED ORDER — TIZANIDINE HCL 4 MG PO TABS: 4.0000 mg | ORAL_TABLET | Freq: Every day | ORAL | 5 refills | Status: DC

## 2018-11-17 MED ORDER — GABAPENTIN 300 MG PO CAPS: 600.0000 mg | ORAL_CAPSULE | Freq: Three times a day (TID) | ORAL | 3 refills | Status: DC

## 2018-11-17 MED ORDER — METFORMIN HCL 500 MG PO TABS: 1000.0000 mg | ORAL_TABLET | Freq: Two times a day (BID) | ORAL | 6 refills | Status: DC

## 2018-11-17 MED ORDER — ATORVASTATIN CALCIUM 40 MG PO TABS: 40.0000 mg | ORAL_TABLET | Freq: Every day | ORAL | 5 refills | Status: DC

## 2018-11-17 MED ORDER — DULOXETINE HCL 60 MG PO CPEP: 60.0000 mg | ORAL_CAPSULE | Freq: Every day | ORAL | 3 refills | Status: DC

## 2018-11-17 NOTE — Progress Notes (Signed)
Patient is having pain from neck down back.

## 2018-11-17 NOTE — Progress Notes (Signed)
Subjective:  Patient ID: David Wyatt, male    DOB: October 01, 1960  Age: 58 y.o. MRN: [id number]  CC: Diabetes   HPI David Wyatt is a 58 year old male with a history of type 2 diabetes mellitus (A1c 8.1), diabetic neuropathy, hypertension, chronic low back pain (status post previous back surgery) who comes into the clinic for a follow-up visit. He complains of right-sided neck pain which radiates down the right side of his back with associated tingling and numbness which also occurs in both feet.  He has been out of all his medications including gabapentin and Cymbalta.  Symptoms are chronic. He was seen by neurology Dr. Leta Baptist on 10/27/2017 for the same symptoms and recommendations were to continue aspirin, PT and pain medications.  MRI from 12/2017 revealed: IMPRESSION: 1. 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 2. Unchanged broad-based central disc protrusion and annular fissure at L5-S1 with moderate right and mild left lateral recess stenosis which could affect either S1 nerve root.   With regards to his diabetes mellitus he has been out of glipizide and metformin and his A1c is elevated at 8.1.  He is not up-to-date on annual eye exam, he has not been checking his blood sugars. Also not compliant with his statin and antihypertensive.  Past Medical History:  Diagnosis Date  . Angioedema of lips 07/28/2012   left upper (07/29/2012)  . Arthritis    hands and back  . Chronic back pain   . Cocaine abuse ([facility])   . Diabetic neuropathy ([place])   . High cholesterol   . Hypertension   . Neuropathy   . Type II diabetes mellitus ([place])     Past Surgical History:  Procedure Laterality Date  . BACK SURGERY    . HERNIA REPAIR Right 04/01/2012  . I&D EXTREMITY Left 12/13/2012   Procedure: IRRIGATION AND DEBRIDEMENT LEFT THUMB;  Surgeon: Tennis Must, MD;  Location: [facility];  Service: Orthopedics;  Laterality: Left;  . INCISION AND DRAINAGE OF WOUND     boil on back/notes 07/14/2008 (07/29/2012)  . INGUINAL HERNIA REPAIR  04/01/2012   Procedure: HERNIA REPAIR INGUINAL ADULT;  Surgeon: Haywood Lasso, MD;  Location: [facility];  Service: General;  Laterality: Right;  . INGUINAL HERNIA REPAIR Left 09/06/2016   Procedure: OPEN REPAIR LEFT INGUINAL HERNIA;  Surgeon: Greer Pickerel, MD;  Location: [facility];  Service: General;  Laterality: Left;  . INSERTION OF MESH Left 09/06/2016   Procedure: INSERTION OF MESH;  Surgeon: Greer Pickerel, MD;  Location: [facility];  Service: General;  Laterality: Left;  . TONSILLECTOMY      Family History  Problem Relation Age of Onset  . Kidney disease Mother   . Diabetes Father     Allergies  Allergen Reactions  . Lisinopril Other (See Comments)    Lip Swelling, angioedema    Outpatient Medications Prior to Visit  Medication Sig Dispense Refill  . Blood Glucose Monitoring Suppl (TRUE METRIX METER) w/Device KIT Use as directed to check blood sugars up to twice per day 1 kit 0  . glucose blood (TRUE METRIX BLOOD GLUCOSE TEST) test strip Use as instructed to check blood sugar up to twice per day 60 each 6  . TRUEplus Lancets 28G MISC Use once or twice per day to check blood sugar 60 each 6  . amLODipine (NORVASC) 5 MG tablet Take 1 tablet (5 mg  total) by mouth daily. To lower blood pressure 30 tablet 5  . atorvastatin (LIPITOR) 40 MG tablet Take 1 tablet (40 mg total) by mouth daily. 30 tablet 5  . DULoxetine (CYMBALTA) 60 MG capsule Take 1 capsule (60 mg total) by mouth daily. 30 capsule 3  . gabapentin (NEURONTIN) 300 MG capsule Take 2 capsules (600 mg total) by mouth 3 (three) times daily. 180 capsule 3  . glipiZIDE (GLUCOTROL XL) 10 MG 24 hr tablet Take 1 tablet (10 mg total) by mouth daily with breakfast. 30 tablet 3  . metFORMIN (GLUCOPHAGE) 500 MG tablet Take 2 tablets (1,000 mg total) by mouth 2 (two) times  daily with a meal. 120 tablet 0  . naproxen (NAPROSYN) 500 MG tablet Take 1 tablet (500 mg total) by mouth 2 (two) times daily with a meal. 60 tablet 3  . tiZANidine (ZANAFLEX) 4 MG tablet Take 1 tablet (4 mg total) by mouth at bedtime. As needed for muscle spasm 30 tablet 5  . fluticasone (FLONASE) 50 MCG/ACT nasal spray Place 2 sprays into both nostrils daily. (Patient not taking: Reported on 08/26/2018) 16 g 0  . lidocaine (LIDODERM) 5 % Place 1 patch onto the skin daily. Remove & Discard patch within 12 hours or as directed by MD (Patient not taking: Reported on 08/26/2018) 30 patch 0  . loratadine (CLARITIN) 10 MG tablet Take 1 tablet (10 mg total) by mouth daily. (Patient not taking: Reported on 11/17/2018) 30 tablet 0  . meclizine (ANTIVERT) 25 MG tablet Take 1 tablet (25 mg total) by mouth 3 (three) times daily as needed for dizziness. (Patient not taking: Reported on 08/26/2018) 30 tablet 0  . ondansetron (ZOFRAN ODT) 4 MG disintegrating tablet Take 1 tablet (4 mg total) by mouth every 8 (eight) hours as needed for nausea or vomiting. (Patient not taking: Reported on 08/26/2018) 9 tablet 0  . cyclobenzaprine (FLEXERIL) 10 MG tablet Take 1 tablet (10 mg total) by mouth 2 (two) times daily as needed for muscle spasms. (Patient not taking: Reported on 11/17/2018) 20 tablet 0   No facility-administered medications prior to visit.      ROS Review of Systems  Constitutional: Negative for activity change and appetite change.  HENT: Negative for sinus pressure and sore throat.   Eyes: Negative for visual disturbance.  Respiratory: Negative for cough, chest tightness and shortness of breath.   Cardiovascular: Negative for chest pain and leg swelling.  Gastrointestinal: Negative for abdominal distention, abdominal pain, constipation and diarrhea.  Endocrine: Negative.   Genitourinary: Negative for dysuria.  Musculoskeletal: Positive for back pain and neck pain. Negative for joint swelling and  myalgias.  Skin: Negative for rash.  Allergic/Immunologic: Negative.   Neurological: Positive for numbness. Negative for weakness and light-headedness.  Psychiatric/Behavioral: Negative for dysphoric mood and suicidal ideas.    Objective:  BP 126/73   Pulse 79   Temp 98.2 F (36.8 C) (Oral)   Ht 5' 8" (1.727 m)   Wt 158 lb 9.6 oz (71.9 kg)   SpO2 96%   BMI 24.12 kg/m   BP/Weight 11/17/2018 07/24/2018 25/36/6440  Systolic BP 347 425 956  Diastolic BP 73 91 94  Wt. (Lbs) 158.6 160.2 -  BMI 24.12 24.36 -      Physical Exam Constitutional:      Appearance: He is well-developed.  Cardiovascular:     Rate and Rhythm: Normal rate.     Heart sounds: Normal heart sounds. No murmur.  Pulmonary:  Effort: Pulmonary effort is normal.     Breath sounds: Normal breath sounds. No wheezing or rales.  Chest:     Chest wall: No tenderness.  Abdominal:     General: Bowel sounds are normal. There is no distension.     Palpations: Abdomen is soft. There is no mass.     Tenderness: There is no abdominal tenderness.  Musculoskeletal:     Comments: Tenderness on palpation of right side of neck and right side of posterior thorax Normal range of motion of both upper extremities Negative straight leg raise bilaterally  Neurological:     Mental Status: He is alert and oriented to person, place, and time.  Psychiatric:        Mood and Affect: Mood normal.     CMP Latest Ref Rng & Units 07/24/2018 01/04/2018 12/21/2017  Glucose 65 - 99 mg/dL 159(H) 174(H) 234(H)  BUN 6 - 24 mg/dL _0 Creatinine 0.76 - 1.27 mg/dL 1.23 1.05 1.11  Sodium 134 - 144 mmol/L 140 136 139  Potassium 3.5 - 5.2 mmol/L 4.8 4.4 3.5  Chloride 96 - 106 mmol/L 105 105 108  CO2 20 - 29 mmol/L _1 Calcium 8.7 - 10.2 mg/dL 9.9 9.0 9.0  Total Protein 6.0 - 8.5 g/dL 7.5 7.4 -  Total Bilirubin 0.0 - 1.2 mg/dL 0.5 0.9 -  Alkaline Phos 39 - 117 IU/L 105 92 -  AST 0 - 40 IU/L 15 19 -  ALT 0 - 44 IU/L 13 13 -     Lipid Panel     Component Value Date/Time   CHOL 215 (H) 07/24/2018 1157   TRIG 68 07/24/2018 1157   HDL 72 07/24/2018 1157   CHOLHDL 3.0 07/24/2018 1157   CHOLHDL 3.6 05/22/2016 0852   VLDL 32 (H) 05/03/2015 0954   LDLCALC 129 (H) 07/24/2018 1157    CBC    Component Value Date/Time   WBC 14.0 (H) 01/04/2018 1052   RBC 5.06 01/04/2018 1052   HGB 15.5 01/04/2018 1052   HCT 48.2 01/04/2018 1052   PLT 377 01/04/2018 1052   MCV 95.3 01/04/2018 1052   MCH 30.6 01/04/2018 1052   MCHC 32.2 01/04/2018 1052   RDW 13.0 01/04/2018 1052   LYMPHSABS 1.7 12/21/2017 1128   MONOABS 0.4 12/21/2017 1128   EOSABS 0.1 12/21/2017 1128   BASOSABS 0.0 12/21/2017 1128    Lab Results  Component Value Date   HGBA1C 8.1 (A) 11/17/2018    Assessment & Plan:   1. Chronic bilateral low back pain with bilateral sciatica Uncontrolled due to running out of medications which have refilled Status post previous back surgery Will benefit from PT - gabapentin (NEURONTIN) 300 MG capsule; Take 2 capsules (600 mg total) by mouth 3 (three) times daily.  Dispense: 180 capsule; Refill: 3 - DULoxetine (CYMBALTA) 60 MG capsule; Take 1 capsule (60 mg total) by mouth daily.  Dispense: 30 capsule; Refill: 3 - naproxen (NAPROSYN) 500 MG tablet; Take 1 tablet (500 mg total) by mouth 2 (two) times daily with a meal.  Dispense: 60 tablet; Refill: 3  2. Other diabetic neurological complication associated with type 2 diabetes mellitus ([place]) Uncontrolled due to running out of gabapentin - gabapentin (NEURONTIN) 300 MG capsule; Take 2 capsules (600 mg total) by mouth 3 (three) times daily.  Dispense: 180 capsule; Refill: 3  3. Lumbar disc herniation See #1 above - Ambulatory referral to Physical Therapy - DULoxetine (CYMBALTA) 60 MG capsule; Take 1  capsule (60 mg total) by mouth daily.  Dispense: 30 capsule; Refill: 3 - naproxen (NAPROSYN) 500 MG tablet; Take 1 tablet (500 mg total) by mouth 2 (two) times daily with  a meal.  Dispense: 60 tablet; Refill: 3  4. Essential hypertension, benign Controlled Counseled on blood pressure goal of less than 130/80, low-sodium, DASH diet, medication compliance, 150 minutes of moderate intensity exercise per week. Discussed medication compliance, adverse effects. - amLODipine (NORVASC) 5 MG tablet; Take 1 tablet (5 mg total) by mouth daily. To lower blood pressure  Dispense: 30 tablet; Refill: 5  5. Uncontrolled type 2 diabetes mellitus with hyperglycemia ([facility]) Uncontrolled with A1c of 8.1 He has been out of his medications which have refilled Counseled on Diabetic diet, my plate method, 606 minutes of moderate intensity exercise/week Keep blood sugar logs with fasting goals of 80-120 mg/dl, random of less than 180 and in the event of sugars less than 60 mg/dl or greater than 400 mg/dl please notify the clinic ASAP. It is recommended that you undergo annual eye exams and annual foot exams. Pneumonia vaccine is recommended. - POCT glucose (manual entry) - POCT glycosylated hemoglobin (Hb A1C) - Ambulatory referral to Ophthalmology - atorvastatin (LIPITOR) 40 MG tablet; Take 1 tablet (40 mg total) by mouth daily.  Dispense: 30 tablet; Refill: 5 - glipiZIDE (GLUCOTROL XL) 10 MG 24 hr tablet; Take 1 tablet (10 mg total) by mouth daily with breakfast.  Dispense: 30 tablet; Refill: 6 - metFORMIN (GLUCOPHAGE) 500 MG tablet; Take 2 tablets (1,000 mg total) by mouth 2 (two) times daily with a meal.  Dispense: 120 tablet; Refill: 6  6. Muscle spasm of back - Ambulatory referral to Physical Therapy - tiZANidine (ZANAFLEX) 4 MG tablet; Take 1 tablet (4 mg total) by mouth at bedtime. As needed for muscle spasm  Dispense: 30 tablet; Refill: 5  7. Healthcare maintenance - Flu Vaccine QUAD 36+ mos IM   Health Care Maintenance: Colonoscopy referral at next visit Meds ordered this encounter  Medications  . gabapentin (NEURONTIN) 300 MG capsule    Sig: Take 2 capsules (600 mg  total) by mouth 3 (three) times daily.    Dispense:  180 capsule    Refill:  3    Discontinue previous dose  . DULoxetine (CYMBALTA) 60 MG capsule    Sig: Take 1 capsule (60 mg total) by mouth daily.    Dispense:  30 capsule    Refill:  3  . naproxen (NAPROSYN) 500 MG tablet    Sig: Take 1 tablet (500 mg total) by mouth 2 (two) times daily with a meal.    Dispense:  60 tablet    Refill:  3  . amLODipine (NORVASC) 5 MG tablet    Sig: Take 1 tablet (5 mg total) by mouth daily. To lower blood pressure    Dispense:  30 tablet    Refill:  5  . atorvastatin (LIPITOR) 40 MG tablet    Sig: Take 1 tablet (40 mg total) by mouth daily.    Dispense:  30 tablet    Refill:  5  . tiZANidine (ZANAFLEX) 4 MG tablet    Sig: Take 1 tablet (4 mg total) by mouth at bedtime. As needed for muscle spasm    Dispense:  30 tablet    Refill:  5  . glipiZIDE (GLUCOTROL XL) 10 MG 24 hr tablet    Sig: Take 1 tablet (10 mg total) by mouth daily with breakfast.    Dispense:  30  tablet    Refill:  6  . metFORMIN (GLUCOPHAGE) 500 MG tablet    Sig: Take 2 tablets (1,000 mg total) by mouth 2 (two) times daily with a meal.    Dispense:  120 tablet    Refill:  6    Follow-up: Return in about 3 months (around 02/16/2019) for Medical conditions.       [name], MD, FAAFP. [facility] and [place], [place]   11/17/2018, 3:52 PM

## 2018-11-18 MED FILL — glipiZIDE XL 10 MG TB24: 10 | 30 days supply | Qty: 30 | Fill #0

## 2018-11-18 MED FILL — AMLODIPINE BESYLATE 5 MG TA: 5 | 30 days supply | Qty: 30 | Fill #0

## 2018-11-18 MED FILL — tiZANidine HCL 4 MG TABS: 4 | 30 days supply | Qty: 30 | Fill #0

## 2018-11-18 MED FILL — NAPROXEN 500 MG TABLET: 500 | 30 days supply | Qty: 60 | Fill #0

## 2018-11-18 MED FILL — metFORMIN HCL 500 MG TABS: 500 | 30 days supply | Qty: 120 | Fill #0

## 2018-11-18 MED FILL — ATORVASTATIN CALCIUM 40 MG: 40 | 30 days supply | Qty: 30 | Fill #0

## 2018-11-18 MED FILL — DULoxetine HCL 60 MG CPEP: 60 | 30 days supply | Qty: 30 | Fill #0

## 2018-11-18 MED FILL — GABAPENTIN 300 MG CAPSULE: 300 | 30 days supply | Qty: 180 | Fill #0

## 2018-12-03 ENCOUNTER — Encounter: Payer: Self-pay | Admitting: Physical Therapy

## 2018-12-03 ENCOUNTER — Ambulatory Visit: Payer: Medicaid Other | Attending: Family Medicine | Admitting: Physical Therapy

## 2018-12-03 ENCOUNTER — Other Ambulatory Visit: Payer: Self-pay

## 2018-12-03 DIAGNOSIS — M5442 Lumbago with sciatica, left side: Secondary | ICD-10-CM | POA: Insufficient documentation

## 2018-12-03 DIAGNOSIS — G8929 Other chronic pain: Secondary | ICD-10-CM

## 2018-12-03 DIAGNOSIS — R2689 Other abnormalities of gait and mobility: Secondary | ICD-10-CM | POA: Insufficient documentation

## 2018-12-03 DIAGNOSIS — M62830 Muscle spasm of back: Secondary | ICD-10-CM

## 2018-12-03 DIAGNOSIS — M5441 Lumbago with sciatica, right side: Secondary | ICD-10-CM | POA: Insufficient documentation

## 2018-12-03 DIAGNOSIS — R293 Abnormal posture: Secondary | ICD-10-CM

## 2018-12-03 DIAGNOSIS — M5126 Other intervertebral disc displacement, lumbar region: Secondary | ICD-10-CM

## 2018-12-03 NOTE — Therapy (Signed)
[place], [place], [address] Phone: [phone]   Fax:  [phone]  Physical Therapy Note  Patient Details  Name: Beni Kubas MRN: [id number] Date of Birth: 06/18/1960 Referring Provider (PT): [name], MD    Encounter Date: 12/03/2018  PT End of Session - 12/03/18 1628    Visit Number  --    Number of Visits  --    Date for PT Re-Evaluation  --    Authorization Type  --    PT Start Time  1628    Activity Tolerance  Patient tolerated treatment well    Behavior During Therapy  [facility] for tasks assessed/performed       Past Medical History:  Diagnosis Date  . Angioedema of lips 07/28/2012   left upper (07/29/2012)  . Arthritis    hands and back  . Chronic back pain   . Cocaine abuse ([place])   . Diabetic neuropathy ([place])   . High cholesterol   . Hypertension   . Neuropathy   . Type II diabetes mellitus ([place])     Past Surgical History:  Procedure Laterality Date  . BACK SURGERY    . HERNIA REPAIR Right 04/01/2012  . I&D EXTREMITY Left 12/13/2012   Procedure: IRRIGATION AND DEBRIDEMENT LEFT THUMB;  Surgeon: Tennis Must, MD;  Location: [place];  Service: Orthopedics;  Laterality: Left;  . INCISION AND DRAINAGE OF WOUND     boil on back/notes 07/14/2008 (07/29/2012)  . INGUINAL HERNIA REPAIR  04/01/2012   Procedure: HERNIA REPAIR INGUINAL ADULT;  Surgeon: Haywood Lasso, MD;  Location: [place];  Service: General;  Laterality: Right;  . INGUINAL HERNIA REPAIR Left 09/06/2016   Procedure: OPEN REPAIR LEFT INGUINAL HERNIA;  Surgeon: Greer Pickerel, MD;  Location: [place];  Service: General;  Laterality: Left;  . INSERTION OF MESH Left 09/06/2016   Procedure: INSERTION OF MESH;  Surgeon: Greer Pickerel, MD;  Location: [place];  Service: General;  Laterality: Left;  . TONSILLECTOMY      There were no vitals filed for this visit.   Subjective Assessment - 12/03/18 1634    Subjective  pt reports that he has  low back pain that has increased since he was seen here last. He does report shooting pain down bil LE and reports that is increasing in frequency and severity. He also reports he recently has had issues with holding his bladder which has began within the last 2 weeks and has constant trouble with that.         [facility] PT Assessment - 12/03/18 0001      Assessment   Medical Diagnosis  Lumbar disc herniation, Muscle spasm of back     Referring Provider (PT)  [name], MD       ROM / Strength   AROM / PROM / Strength  AROM;PROM;Strength      Strength   Strength Assessment Site  Hip;Knee                Objective measurements completed on examination: See above findings.              PT Education - 12/03/18 1615    Education Details  evaluation findings, POC, goals, HEP with proper form/ rationale,    Person(s) Educated  Patient    Methods  Explanation;Verbal cues    Comprehension  Verbalized understanding;Verbal cues required       PT Short Term Goals - 09/29/18 1611  PT SHORT TERM GOAL #1   Title  pt to be I with inital HEP    Baseline  doing them when he has pain    Time  3    Period  Weeks      PT SHORT TERM GOAL #2   Title  pt to verbalize and demo proper posture and lifting mechanics to reduce and prevent low back pain    Time  3    Period  Weeks        PT Long Term Goals - 09/29/18 1614      PT LONG TERM GOAL #1   Title  pt to increase trunk extension by >/= 10 degrees to promote functional mobility with </= 2/10 pain    Baseline  increased to 20 degrees, intially was 13 degrees    Period  Weeks    Status  On-going    Target Date  10/21/18      PT LONG TERM GOAL #2   Title  pt to be able to walk/ stand >/= 30 min for functional endurance required for community ambulation with </= 2/10 pain    Baseline  reports able stand and walk 15 min pain gets on average around 3/10    Period  Weeks    Status  On-going      PT LONG TERM  GOAL #3   Title  pt to report decrease bil LE referral for >/= 1 week for functional improvement in symptoms    Baseline  continues bil LE referred pain R>L    Period  Weeks    Status  On-going      PT LONG TERM GOAL #4   Title  pt to be I with all HEP given as of last visit to maintain and progress current level of function    Baseline  independent with current HEP and progress as able.    Period  Weeks    Status  On-going             Plan - 12/03/18 1651    Clinical Impression Statement  Based on subjective history of worsening symptoms in theback that radiates down bil LE and the new symtpom of changes in bowel/ bladder retention which he stated he did not mention to his PCP. per patient report opted to hold PT evaluation due to red flags. educated on anatomy and what could happening. I  highly suggested that he needs to go to the emergency department for further assesment. he stated he would go to the ED and would drive himself.       Patient will benefit from skilled therapeutic intervention in order to improve the following deficits and impairments:     Visit Diagnosis: Lumbar disc herniation  Chronic bilateral low back pain with bilateral sciatica  Abnormal posture  Other abnormalities of gait and mobility  Muscle spasm of back     Problem List Patient Active Problem List   Diagnosis Date Noted  . Tobacco abuse 08/19/2018  . Hyperlipidemia 04/24/2017  . GERD (gastroesophageal reflux disease) 06/13/2015  . Controlled type 2 diabetes mellitus with complication, without long-term current use of insulin ([place]) 05/01/2015  . Diabetic neuropathy ([place]) 05/01/2015  . Essential hypertension, benign 11/01/2013  . Lumbar disc herniation 09/13/2013  . Unspecified constipation 09/13/2013  . Diabetes mellitus due to underlying condition without complications ([place]) [id number]  . Chronic back pain 09/13/2013  . Viral gastroenteritis 05/05/2013  . Hypoglycemia 05/05/2013   . Felon of finger  of left hand with lymphangitis 12/13/2012  . Hyperglycemia 07/29/2012  . Angioedema of lips 07/29/2012  . Crack cocaine use 07/29/2012  . Arthritis 07/29/2012  . Weakness generalized 07/29/2012    Starr Lake PT, DPT, LAT, ATC  12/03/18  4:56 PM      [facility] [address] [place], [place], [address] Phone: [phone]   Fax:  [phone]  Name: Damius Knicely MRN: [id number] Date of Birth: [birth year]

## 2019-01-05 ENCOUNTER — Encounter (HOSPITAL_COMMUNITY): Payer: Self-pay | Admitting: Emergency Medicine

## 2019-01-05 ENCOUNTER — Other Ambulatory Visit: Payer: Self-pay

## 2019-01-05 ENCOUNTER — Emergency Department (HOSPITAL_COMMUNITY)
Admission: EM | Admit: 2019-01-05 | Discharge: 2019-01-05 | Disposition: A | Discharge status: Home / Self Care | Payer: Medicaid Other | Attending: Emergency Medicine | Admitting: Emergency Medicine

## 2019-01-05 DIAGNOSIS — Z888 Allergy status to other drugs, medicaments and biological substances status: Secondary | ICD-10-CM | POA: Diagnosis not present

## 2019-01-05 DIAGNOSIS — R350 Frequency of micturition: Secondary | ICD-10-CM | POA: Diagnosis not present

## 2019-01-05 DIAGNOSIS — M549 Dorsalgia, unspecified: Secondary | ICD-10-CM | POA: Diagnosis present

## 2019-01-05 DIAGNOSIS — E782 Mixed hyperlipidemia: Secondary | ICD-10-CM | POA: Insufficient documentation

## 2019-01-05 DIAGNOSIS — M5126 Other intervertebral disc displacement, lumbar region: Secondary | ICD-10-CM

## 2019-01-05 DIAGNOSIS — G894 Chronic pain syndrome: Secondary | ICD-10-CM | POA: Diagnosis not present

## 2019-01-05 DIAGNOSIS — F17210 Nicotine dependence, cigarettes, uncomplicated: Secondary | ICD-10-CM | POA: Insufficient documentation

## 2019-01-05 DIAGNOSIS — I10 Essential (primary) hypertension: Secondary | ICD-10-CM | POA: Insufficient documentation

## 2019-01-05 DIAGNOSIS — E1149 Type 2 diabetes mellitus with other diabetic neurological complication: Secondary | ICD-10-CM

## 2019-01-05 DIAGNOSIS — Z7984 Long term (current) use of oral hypoglycemic drugs: Secondary | ICD-10-CM | POA: Diagnosis not present

## 2019-01-05 DIAGNOSIS — R200 Anesthesia of skin: Secondary | ICD-10-CM | POA: Insufficient documentation

## 2019-01-05 DIAGNOSIS — M5442 Lumbago with sciatica, left side: Secondary | ICD-10-CM

## 2019-01-05 DIAGNOSIS — E119 Type 2 diabetes mellitus without complications: Secondary | ICD-10-CM | POA: Diagnosis not present

## 2019-01-05 DIAGNOSIS — G8929 Other chronic pain: Secondary | ICD-10-CM

## 2019-01-05 DIAGNOSIS — Z79899 Other long term (current) drug therapy: Secondary | ICD-10-CM | POA: Insufficient documentation

## 2019-01-05 LAB — URINALYSIS, ROUTINE W REFLEX MICROSCOPIC
Bilirubin Urine: NEGATIVE
Glucose, UA: NEGATIVE mg/dL
Hgb urine dipstick: NEGATIVE
Ketones, ur: NEGATIVE mg/dL
Leukocytes,Ua: NEGATIVE
Nitrite: NEGATIVE
Protein, ur: NEGATIVE mg/dL
Specific Gravity, Urine: 1.025 (ref 1.005–1.030)
pH: 5 (ref 5.0–8.0)

## 2019-01-05 LAB — BASIC METABOLIC PANEL
Anion gap: 10 (ref 5–15)
BUN: 15 mg/dL (ref 6–20)
CO2: 22 mmol/L (ref 22–32)
Calcium: 9.1 mg/dL (ref 8.9–10.3)
Chloride: 106 mmol/L (ref 98–111)
Creatinine, Ser: 0.91 mg/dL (ref 0.61–1.24)
GFR calc Af Amer: >60 mL/min (ref >60)
GFR calc non Af Amer: >60 mL/min (ref >60)
Glucose, Bld: 150 mg/dL — ABNORMAL HIGH (ref 70–99)
Potassium: 4.4 mmol/L (ref 3.5–5.1)
Sodium: 138 mmol/L (ref 135–145)

## 2019-01-05 LAB — CBC
HCT: 42 % (ref 39.0–52.0)
Hemoglobin: 13.7 g/dL (ref 13.0–17.0)
MCH: 31.1 pg (ref 26.0–34.0)
MCHC: 32.6 g/dL (ref 30.0–36.0)
MCV: 95.5 fL (ref 80.0–100.0)
Platelets: 361 10*3/uL (ref 150–400)
RBC: 4.4 MIL/uL (ref 4.22–5.81)
RDW: 12.8 % (ref 11.5–15.5)
WBC: 6.7 10*3/uL (ref 4.0–10.5)
nRBC: 0 % (ref 0.0–0.2)

## 2019-01-05 LAB — CBG MONITORING, ED: Glucose-Capillary: 138 mg/dL — ABNORMAL HIGH (ref 70–99)

## 2019-01-05 MED ORDER — DULOXETINE HCL 60 MG PO CPEP: 60.0000 mg | ORAL_CAPSULE | Freq: Every day | ORAL | 3 refills | Status: DC

## 2019-01-05 MED ORDER — GABAPENTIN 300 MG PO CAPS: 600.0000 mg | ORAL_CAPSULE | Freq: Three times a day (TID) | ORAL | 3 refills | Status: DC

## 2019-01-05 MED ORDER — PREDNISONE 20 MG PO TABS: 40.0000 mg | ORAL_TABLET | Freq: Every day | ORAL | 0 refills | Status: DC

## 2019-01-05 NOTE — ED Provider Notes (Signed)
[facility] EMERGENCY DEPARTMENT Provider Note   CSN: [id number] Arrival date & time: 01/05/19  1127     History   Chief Complaint Chief Complaint  Patient presents with  . Back Pain  . Urinary Frequency    HPI David Wyatt is a 58 y.o. male.     HPI Patient presents with pain in his back and neck.  Acute on chronic.  States he also has numbness and tingling on the whole right side of his body.  Involves his face chest arm and right leg.  Has had this for a while and has seen neurology for.  Has had reported extensive work-up and MRI.  No cause seen.  Has been also dealing with chronic back pain.  Recently saw PCP for and started back on his medicines.  However patient states he did not get any of them filled due to financial reasons.  No loss of bladder or bowel control.  Some mild urinary frequency.  No dysuria.  No accidents. Past Medical History:  Diagnosis Date  . Angioedema of lips 07/28/2012   left upper (07/29/2012)  . Arthritis    hands and back  . Chronic back pain   . Cocaine abuse ([place])   . Diabetic neuropathy ([place])   . High cholesterol   . Hypertension   . Neuropathy   . Type II diabetes mellitus [facility])     Patient Active Problem List   Diagnosis Date Noted  . Tobacco abuse 08/19/2018  . Hyperlipidemia 04/24/2017  . GERD (gastroesophageal reflux disease) 06/13/2015  . Controlled type 2 diabetes mellitus with complication, without long-term current use of insulin ([place]) 05/01/2015  . Diabetic neuropathy ([name]) 05/01/2015  . Essential hypertension, benign 11/01/2013  . Lumbar disc herniation 09/13/2013  . Unspecified constipation 09/13/2013  . Diabetes mellitus due to underlying condition without complications ([place]) 63/33/5456  . Chronic back pain 09/13/2013  . Viral gastroenteritis 05/05/2013  . Hypoglycemia 05/05/2013  . Felon of finger of left hand with lymphangitis 12/13/2012  . Hyperglycemia 07/29/2012  . Angioedema of lips  07/29/2012  . Crack cocaine use 07/29/2012  . Arthritis 07/29/2012  . Weakness generalized 07/29/2012    Past Surgical History:  Procedure Laterality Date  . BACK SURGERY    . HERNIA REPAIR Right 04/01/2012  . I&D EXTREMITY Left 12/13/2012   Procedure: IRRIGATION AND DEBRIDEMENT LEFT THUMB;  Surgeon: Tennis Must, MD;  Location: [place];  Service: Orthopedics;  Laterality: Left;  . INCISION AND DRAINAGE OF WOUND     boil on back/notes 07/14/2008 (07/29/2012)  . INGUINAL HERNIA REPAIR  04/01/2012   Procedure: HERNIA REPAIR INGUINAL ADULT;  Surgeon: Haywood Lasso, MD;  Location: [place];  Service: General;  Laterality: Right;  . INGUINAL HERNIA REPAIR Left 09/06/2016   Procedure: OPEN REPAIR LEFT INGUINAL HERNIA;  Surgeon: Greer Pickerel, MD;  Location: [place];  Service: General;  Laterality: Left;  . INSERTION OF MESH Left 09/06/2016   Procedure: INSERTION OF MESH;  Surgeon: Greer Pickerel, MD;  Location: [place];  Service: General;  Laterality: Left;  . TONSILLECTOMY          Home Medications    Prior to Admission medications   Medication Sig Start Date End Date Taking? Authorizing Provider  amLODipine (NORVASC) 5 MG tablet Take 1 tablet (5 mg total) by mouth daily. To lower blood pressure 11/17/18   [name], MD  atorvastatin (LIPITOR) 40 MG tablet Take 1 tablet (40 mg total) by mouth daily.  11/17/18   [name], MD  Blood Glucose Monitoring Suppl (TRUE METRIX METER) w/Device KIT Use as directed to check blood sugars up to twice per day 07/24/18   Fulp, Cammie, MD  DULoxetine (CYMBALTA) 60 MG capsule Take 1 capsule (60 mg total) by mouth daily. 01/05/19   Davonna Belling, MD  fluticasone (FLONASE) 50 MCG/ACT nasal spray Place 2 sprays into both nostrils daily. 06/03/17   [name], MD  gabapentin (NEURONTIN) 300 MG capsule Take 2 capsules (600 mg total) by mouth 3 (three) times daily. 01/05/19   Davonna Belling, MD  glipiZIDE (GLUCOTROL XL) 10 MG 24 hr tablet Take 1 tablet  (10 mg total) by mouth daily with breakfast. 11/17/18   [name], MD  glucose blood (TRUE METRIX BLOOD GLUCOSE TEST) test strip Use as instructed to check blood sugar up to twice per day 07/24/18   Fulp, Cammie, MD  lidocaine (LIDODERM) 5 % Place 1 patch onto the skin daily. Remove & Discard patch within 12 hours or as directed by MD 06/05/17   [name], MD  loratadine (CLARITIN) 10 MG tablet Take 1 tablet (10 mg total) by mouth daily. 06/03/17   [name], MD  meclizine (ANTIVERT) 25 MG tablet Take 1 tablet (25 mg total) by mouth 3 (three) times daily as needed for dizziness. 06/03/17   [name], MD  metFORMIN (GLUCOPHAGE) 500 MG tablet Take 2 tablets (1,000 mg total) by mouth 2 (two) times daily with a meal. 11/17/18   [name], MD  naproxen (NAPROSYN) 500 MG tablet Take 1 tablet (500 mg total) by mouth 2 (two) times daily with a meal. 11/17/18   Newlin, Enobong, MD  ondansetron (ZOFRAN ODT) 4 MG disintegrating tablet Take 1 tablet (4 mg total) by mouth every 8 (eight) hours as needed for nausea or vomiting. 01/04/18   Fawze, Mina A, PA-C  predniSONE (DELTASONE) 20 MG tablet Take 2 tablets (40 mg total) by mouth daily. 01/05/19   Davonna Belling, MD  tiZANidine (ZANAFLEX) 4 MG tablet Take 1 tablet (4 mg total) by mouth at bedtime. As needed for muscle spasm 11/17/18   [name], MD  TRUEplus Lancets 28G MISC Use once or twice per day to check blood sugar 07/24/18   Antony Blackbird, MD    Family History Family History  Problem Relation Age of Onset  . Kidney disease Mother   . Diabetes Father     Social History Social History   Tobacco Use  . Smoking status: Current Every Day Smoker    Packs/day: 0.25    Years: 30.00    Pack years: 7.50    Types: Cigarettes  . Smokeless tobacco: Former Systems developer    Quit date: 08/15/2015  . Tobacco comment: 04/29/16 2  cigs daily, 10/27/17 sometimes < .25 PPD  Substance Use Topics  . Alcohol use: No    Alcohol/week: 0.0 standard  drinks  . Drug use: Yes    Types: "Crack" cocaine    Comment: last use 08/23/16     Allergies   Lisinopril   Review of Systems Review of Systems  Constitutional: Negative for appetite change.  Respiratory: Negative for shortness of breath.   Genitourinary: Negative for flank pain.  Musculoskeletal: Positive for back pain and neck pain.  Skin: Negative for rash and wound.  Neurological: Positive for numbness. Negative for weakness.  Psychiatric/Behavioral: Negative for confusion.     Physical Exam Updated Vital Signs BP 132/78   Pulse 73   Temp 98.5 F (36.9  C) (Oral)   Resp 16   Wt 71.9 kg   SpO2 97%   BMI 24.10 kg/m   Physical Exam Vitals signs and nursing note reviewed.  Constitutional:      Appearance: Normal appearance.  HENT:     Head: Atraumatic.     Left Ear: Tympanic membrane normal.  Neck:     Musculoskeletal: Neck supple.  Cardiovascular:     Rate and Rhythm: Regular rhythm.  Pulmonary:     Breath sounds: Normal breath sounds.  Abdominal:     Tenderness: There is no abdominal tenderness.  Musculoskeletal:     Right lower leg: No edema.     Left lower leg: No edema.     Comments: Tenderness over right trapezius.  Paresthesias over right face right chest right arm and right lower extremity.  Sensation grossly intact.  Strength intact.  Skin:    General: Skin is warm.     Capillary Refill: Capillary refill takes less than 2 seconds.  Neurological:     Mental Status: He is oriented to person, place, and time.      ED Treatments / Results  Labs (all labs ordered are listed, but only abnormal results are displayed) Labs Reviewed  BASIC METABOLIC PANEL - Abnormal; Notable for the following components:      Result Value   Glucose, Bld 150 (*)    All other components within normal limits  CBG MONITORING, ED - Abnormal; Notable for the following components:   Glucose-Capillary 138 (*)    All other components within normal limits  URINALYSIS,  ROUTINE W REFLEX MICROSCOPIC  CBC    EKG None  Radiology No results found.  Procedures Procedures (including critical care time)  Medications Ordered in ED Medications - No data to display   Initial Impression / Assessment and Plan / ED Course  I have reviewed the triage vital signs and the nursing notes.  Pertinent labs & imaging results that were available during my care of the patient were reviewed by me and considered in my medical decision making (see chart for details).        Patient with acute on chronic pain.  States he has been having some bladder issues.  Although does not have a high postvoid residual.  Perineal sensation intact.  Has paresthesias in the right side of body and face but has been worked up for this by neurology.  Will have patient follow-up with PCP.  Will give prednisone to help with symptoms.  Will watch sugars more closely.  Refilled some of his chronic pain medicines.  Seen by social work and given resources and instructions to help get his medicines.  Discharge home  Final Clinical Impressions(s) / ED Diagnoses   Final diagnoses:  Chronic pain syndrome    ED Discharge Orders         Ordered    DULoxetine (CYMBALTA) 60 MG capsule  Daily     01/05/19 2131    gabapentin (NEURONTIN) 300 MG capsule  3 times daily    Note to Pharmacy: Discontinue previous dose   01/05/19 2131    predniSONE (DELTASONE) 20 MG tablet  Daily     01/05/19 2131           Davonna Belling, MD 01/05/19 2133

## 2019-01-05 NOTE — Discharge Instructions (Addendum)
You do not appear to have urinary retention with your back pain.  The numbness is previously been worked up by neurology.  Follow-up with your primary doctor and potentially neurology again.  You will be given steroids to help with the inflammation and hopefully the pain.  Keep a closer watch on your sugars while you are on the steroids

## 2019-01-05 NOTE — ED Notes (Signed)
Patient bladder scanned and had 48 ml in bladder.

## 2019-01-05 NOTE — ED Triage Notes (Signed)
Pt in with c/o low back pain x 2 mo's, hx of CBP and bilateral sciatica. States problems with urinary frequency x 2 mo's as well. Back pain worse today, denies any numbness/tingling to LE's. Able to ambulate

## 2019-01-06 MED FILL — GABAPENTIN 300 MG CAPSULE: 300 | 30 days supply | Qty: 180 | Fill #0

## 2019-01-06 MED FILL — DULoxetine HCL 60 MG CPEP: 60 | 30 days supply | Qty: 30 | Fill #0

## 2019-01-06 MED FILL — predniSONE 20 MG TABS: 20 | 3 days supply | Qty: 6 | Fill #0

## 2019-02-15 ENCOUNTER — Other Ambulatory Visit: Payer: Self-pay | Admitting: Family Medicine

## 2019-02-15 DIAGNOSIS — E1165 Type 2 diabetes mellitus with hyperglycemia: Secondary | ICD-10-CM

## 2019-02-15 MED FILL — ACCU-CHEK FASTCLIX LANCETS: 51 days supply | Qty: 102 | Fill #1

## 2019-02-15 MED FILL — ACCU-CHEK GUIDE TEST STRIP: 50 days supply | Qty: 100 | Fill #1

## 2019-02-16 ENCOUNTER — Ambulatory Visit: Payer: Medicaid Other | Attending: Family Medicine | Admitting: Family Medicine

## 2019-02-16 ENCOUNTER — Encounter: Payer: Self-pay | Admitting: Family Medicine

## 2019-02-16 ENCOUNTER — Other Ambulatory Visit: Payer: Self-pay

## 2019-02-16 ENCOUNTER — Encounter (HOSPITAL_COMMUNITY): Payer: Self-pay | Admitting: Emergency Medicine

## 2019-02-16 ENCOUNTER — Emergency Department (HOSPITAL_COMMUNITY)
Admission: EM | Admit: 2019-02-16 | Discharge: 2019-02-17 | Disposition: A | Discharge status: Home / Self Care | Payer: Medicaid Other | Attending: Emergency Medicine | Admitting: Emergency Medicine

## 2019-02-16 VITALS — BP 144/97 | HR 104 | Temp 98.4°F | Ht 68.0 in | Wt 170.0 lb

## 2019-02-16 DIAGNOSIS — Z794 Long term (current) use of insulin: Secondary | ICD-10-CM | POA: Insufficient documentation

## 2019-02-16 DIAGNOSIS — M1990 Unspecified osteoarthritis, unspecified site: Secondary | ICD-10-CM | POA: Diagnosis not present

## 2019-02-16 DIAGNOSIS — Z590 Homelessness: Secondary | ICD-10-CM | POA: Insufficient documentation

## 2019-02-16 DIAGNOSIS — N179 Acute kidney failure, unspecified: Secondary | ICD-10-CM

## 2019-02-16 DIAGNOSIS — R350 Frequency of micturition: Secondary | ICD-10-CM | POA: Diagnosis not present

## 2019-02-16 DIAGNOSIS — E1140 Type 2 diabetes mellitus with diabetic neuropathy, unspecified: Secondary | ICD-10-CM | POA: Diagnosis not present

## 2019-02-16 DIAGNOSIS — Z91199 Patient's noncompliance with other medical treatment and regimen due to unspecified reason: Secondary | ICD-10-CM

## 2019-02-16 DIAGNOSIS — I10 Essential (primary) hypertension: Secondary | ICD-10-CM | POA: Insufficient documentation

## 2019-02-16 DIAGNOSIS — Z79899 Other long term (current) drug therapy: Secondary | ICD-10-CM | POA: Insufficient documentation

## 2019-02-16 DIAGNOSIS — R631 Polydipsia: Secondary | ICD-10-CM | POA: Diagnosis not present

## 2019-02-16 DIAGNOSIS — F17210 Nicotine dependence, cigarettes, uncomplicated: Secondary | ICD-10-CM | POA: Diagnosis not present

## 2019-02-16 DIAGNOSIS — Z9114 Patient's other noncompliance with medication regimen: Secondary | ICD-10-CM | POA: Diagnosis not present

## 2019-02-16 DIAGNOSIS — E7800 Pure hypercholesterolemia, unspecified: Secondary | ICD-10-CM | POA: Diagnosis not present

## 2019-02-16 DIAGNOSIS — Z9119 Patient's noncompliance with other medical treatment and regimen: Secondary | ICD-10-CM

## 2019-02-16 DIAGNOSIS — R358 Other polyuria: Secondary | ICD-10-CM | POA: Insufficient documentation

## 2019-02-16 DIAGNOSIS — E1165 Type 2 diabetes mellitus with hyperglycemia: Secondary | ICD-10-CM | POA: Diagnosis not present

## 2019-02-16 DIAGNOSIS — R739 Hyperglycemia, unspecified: Secondary | ICD-10-CM

## 2019-02-16 LAB — CBC WITH DIFFERENTIAL/PLATELET
Abs Immature Granulocytes: 0.06 10*3/uL (ref 0.00–0.07)
Basophils Absolute: 0 10*3/uL (ref 0.0–0.1)
Basophils Relative: 1 %
Eosinophils Absolute: 0.1 10*3/uL (ref 0.0–0.5)
Eosinophils Relative: 1 %
HCT: 44 % (ref 39.0–52.0)
Hemoglobin: 14.7 g/dL (ref 13.0–17.0)
Immature Granulocytes: 1 %
Lymphocytes Relative: 30 %
Lymphs Abs: 1.7 10*3/uL (ref 0.7–4.0)
MCH: 31.3 pg (ref 26.0–34.0)
MCHC: 33.4 g/dL (ref 30.0–36.0)
MCV: 93.8 fL (ref 80.0–100.0)
Monocytes Absolute: 0.8 10*3/uL (ref 0.1–1.0)
Monocytes Relative: 14 %
Neutro Abs: 3 10*3/uL (ref 1.7–7.7)
Neutrophils Relative %: 53 %
Platelets: 313 10*3/uL (ref 150–400)
RBC: 4.69 MIL/uL (ref 4.22–5.81)
RDW: 12.6 % (ref 11.5–15.5)
WBC: 5.7 10*3/uL (ref 4.0–10.5)
nRBC: 0 % (ref 0.0–0.2)

## 2019-02-16 LAB — POCT URINALYSIS DIPSTICK
Appearance: NORMAL
Glucose, UA: POSITIVE — AB
Leukocytes, UA: NEGATIVE
Nitrite, UA: NEGATIVE
Protein, UA: POSITIVE — AB
Spec Grav, UA: 1.015 (ref 1.010–1.025)
Urobilinogen, UA: 0.2 E.U./dL
pH, UA: 5 (ref 5.0–8.0)

## 2019-02-16 LAB — COMPREHENSIVE METABOLIC PANEL
ALT: 22 U/L (ref 0–44)
AST: 23 U/L (ref 15–41)
Albumin: 3.6 g/dL (ref 3.5–5.0)
Alkaline Phosphatase: 117 U/L (ref 38–126)
Anion gap: 12 (ref 5–15)
BUN: 22 mg/dL — ABNORMAL HIGH (ref 6–20)
CO2: 24 mmol/L (ref 22–32)
Calcium: 9.2 mg/dL (ref 8.9–10.3)
Chloride: 96 mmol/L — ABNORMAL LOW (ref 98–111)
Creatinine, Ser: 1.63 mg/dL — ABNORMAL HIGH (ref 0.61–1.24)
GFR calc Af Amer: 53 mL/min — ABNORMAL LOW (ref >60)
GFR calc non Af Amer: 45 mL/min — ABNORMAL LOW (ref >60)
Glucose, Bld: 602 mg/dL (ref 70–99)
Potassium: 4.6 mmol/L (ref 3.5–5.1)
Sodium: 132 mmol/L — ABNORMAL LOW (ref 135–145)
Total Bilirubin: 0.4 mg/dL (ref 0.3–1.2)
Total Protein: 7.6 g/dL (ref 6.5–8.1)

## 2019-02-16 LAB — POCT GLYCOSYLATED HEMOGLOBIN (HGB A1C): Hemoglobin A1C: 11.9 % — AB (ref 4.0–5.6)

## 2019-02-16 LAB — CBG MONITORING, ED
Glucose-Capillary: 579 mg/dL (ref 70–99)
Glucose-Capillary: 399 mg/dL — ABNORMAL HIGH (ref 70–99)
Glucose-Capillary: 363 mg/dL — ABNORMAL HIGH (ref 70–99)
Glucose-Capillary: 327 mg/dL — ABNORMAL HIGH (ref 70–99)

## 2019-02-16 LAB — POCT I-STAT EG7
Acid-Base Excess: 2 mmol/L (ref 0.0–2.0)
Bicarbonate: 27.5 mmol/L (ref 20.0–28.0)
Calcium, Ion: 1.18 mmol/L (ref 1.15–1.40)
HCT: 45 % (ref 39.0–52.0)
Hemoglobin: 15.3 g/dL (ref 13.0–17.0)
O2 Saturation: 71 %
Potassium: 4.3 mmol/L (ref 3.5–5.1)
Sodium: 131 mmol/L — ABNORMAL LOW (ref 135–145)
TCO2: 29 mmol/L (ref 22–32)
pCO2, Ven: 46 mmHg (ref 44.0–60.0)
pH, Ven: 7.384 (ref 7.250–7.430)
pO2, Ven: 38 mmHg (ref 32.0–45.0)

## 2019-02-16 LAB — GLUCOSE, POCT (MANUAL RESULT ENTRY): POC Glucose: >600 mg/dl (ref 70–99)

## 2019-02-16 MED ORDER — INSULIN ASPART 100 UNIT/ML ~~LOC~~ SOLN
5.0000 [IU] | Freq: Once | SUBCUTANEOUS | Status: AC
  Administered 2019-02-17: 5 [IU] via SUBCUTANEOUS

## 2019-02-16 MED ORDER — SODIUM CHLORIDE 0.9 % IV BOLUS
1000.0000 mL | Freq: Once | INTRAVENOUS | Status: AC
  Administered 2019-02-16: 1000 mL via INTRAVENOUS

## 2019-02-16 MED ORDER — INSULIN REGULAR(HUMAN) IN NACL 100-0.9 UT/100ML-% IV SOLN: INTRAVENOUS | Status: DC

## 2019-02-16 MED ORDER — ACCU-CHEK GUIDE VI STRP: ORAL_STRIP | 12 refills | Status: DC

## 2019-02-16 MED ORDER — SODIUM CHLORIDE 0.9 % IV SOLN: INTRAVENOUS | Status: DC

## 2019-02-16 MED ORDER — DEXTROSE-NACL 5-0.45 % IV SOLN: INTRAVENOUS | Status: DC

## 2019-02-16 MED ORDER — INSULIN ASPART 100 UNIT/ML ~~LOC~~ SOLN
10.0000 [IU] | Freq: Once | SUBCUTANEOUS | Status: AC
  Administered 2019-02-16: 10 [IU] via SUBCUTANEOUS

## 2019-02-16 MED ORDER — ACCU-CHEK GUIDE ME W/DEVICE KIT: 1.0000 | PACK | Freq: Three times a day (TID) | 0 refills | Status: DC

## 2019-02-16 MED ORDER — DEXTROSE 50 % IV SOLN: 0.0000 mL | INTRAVENOUS | Status: DC | PRN

## 2019-02-16 NOTE — Progress Notes (Signed)
Subjective:  Patient ID: David Wyatt, male    DOB: 02/01/1961  Age: 58 y.o. MRN: [id number]  CC: Follow-up (Pt. is here for 3 months follow up. )   HPI David Wyatt  is a 58 year old male with a history of type 2 diabetes mellitus (A1c 11.9), diabetic neuropathy, hypertension, chronic low back pain (status post previous back surgery) who comes into the clinic for a follow-up visit. He was found to have a blood sugar greater than 600 in the clinic today, urinalysis positive for glucosuria and ketonuria and A1c is 11.9 which is up from 8.1 previously.  He has been without his medications for the last 3 months due to being homeless and living in his car. At his last office visit, 3 months ago I had prescribed all his chronic medications and provided 6 refills. He endorses urinary frequency and polyuria but no abdominal pain, no confusion. Referred to the ED stat for IV fluids and insulin.  Past Medical History:  Diagnosis Date  . Angioedema of lips 07/28/2012   left upper (07/29/2012)  . Arthritis    hands and back  . Chronic back pain   . Cocaine abuse ([place])   . Diabetic neuropathy ([place])   . High cholesterol   . Hypertension   . Neuropathy   . Type II diabetes mellitus ([place])     Past Surgical History:  Procedure Laterality Date  . BACK SURGERY    . HERNIA REPAIR Right 04/01/2012  . I&D EXTREMITY Left 12/13/2012   Procedure: IRRIGATION AND DEBRIDEMENT LEFT THUMB;  Surgeon: Tennis Must, MD;  Location: [place] [facility];  Service: Orthopedics;  Laterality: Left;  . INCISION AND DRAINAGE OF WOUND     boil on back/notes 07/14/2008 (07/29/2012)  . INGUINAL HERNIA REPAIR  04/01/2012   Procedure: HERNIA REPAIR INGUINAL ADULT;  Surgeon: Haywood Lasso, MD;  Location: [place];  Service: General;  Laterality: Right;  . INGUINAL HERNIA REPAIR Left 09/06/2016   Procedure: OPEN REPAIR LEFT INGUINAL HERNIA;  Surgeon: Greer Pickerel, MD;  Location: [place];  Service: General;  Laterality: Left;  . INSERTION OF  MESH Left 09/06/2016   Procedure: INSERTION OF MESH;  Surgeon: Greer Pickerel, MD;  Location: [place];  Service: General;  Laterality: Left;  . TONSILLECTOMY      Family History  Problem Relation Age of Onset  . Kidney disease Mother   . Diabetes Father     Allergies  Allergen Reactions  . Lisinopril Other (See Comments)    Lip Swelling, angioedema    Outpatient Medications Prior to Visit  Medication Sig Dispense Refill  . amLODipine (NORVASC) 5 MG tablet Take 1 tablet (5 mg total) by mouth daily. To lower blood pressure 30 tablet 5  . atorvastatin (LIPITOR) 40 MG tablet Take 1 tablet (40 mg total) by mouth daily. 30 tablet 5  . gabapentin (NEURONTIN) 300 MG capsule Take 2 capsules (600 mg total) by mouth 3 (three) times daily. 180 capsule 3  . glipiZIDE (GLUCOTROL XL) 10 MG 24 hr tablet Take 1 tablet (10 mg total) by mouth daily with breakfast. 30 tablet 6  . glucose blood (TRUE METRIX BLOOD GLUCOSE TEST) test strip Use as instructed to check blood sugar up to twice per day 60 each 6  . lidocaine (LIDODERM) 5 % Place 1 patch onto the skin daily. Remove & Discard patch within 12 hours or as directed by MD 30 patch 0  . loratadine (CLARITIN) 10 MG tablet Take 1 tablet (10  mg total) by mouth daily. 30 tablet 0  . metFORMIN (GLUCOPHAGE) 500 MG tablet Take 2 tablets (1,000 mg total) by mouth 2 (two) times daily with a meal. 120 tablet 6  . naproxen (NAPROSYN) 500 MG tablet Take 1 tablet (500 mg total) by mouth 2 (two) times daily with a meal. 60 tablet 3  . ondansetron (ZOFRAN ODT) 4 MG disintegrating tablet Take 1 tablet (4 mg total) by mouth every 8 (eight) hours as needed for nausea or vomiting. 9 tablet 0  . tiZANidine (ZANAFLEX) 4 MG tablet Take 1 tablet (4 mg total) by mouth at bedtime. As needed for muscle spasm 30 tablet 5  . TRUEplus Lancets 28G MISC Use once or twice per day to check blood sugar 60 each 6  . Blood Glucose Monitoring Suppl (TRUE METRIX METER) w/Device KIT Use as  directed to check blood sugars up to twice per day (Patient not taking: Reported on 02/16/2019) 1 kit 0  . DULoxetine (CYMBALTA) 60 MG capsule Take 1 capsule (60 mg total) by mouth daily. (Patient not taking: Reported on 02/16/2019) 30 capsule 3  . fluticasone (FLONASE) 50 MCG/ACT nasal spray Place 2 sprays into both nostrils daily. (Patient not taking: Reported on 02/16/2019) 16 g 0  . meclizine (ANTIVERT) 25 MG tablet Take 1 tablet (25 mg total) by mouth 3 (three) times daily as needed for dizziness. (Patient not taking: Reported on 02/16/2019) 30 tablet 0  . predniSONE (DELTASONE) 20 MG tablet Take 2 tablets (40 mg total) by mouth daily. (Patient not taking: Reported on 02/16/2019) 6 tablet 0   No facility-administered medications prior to visit.      ROS Review of Systems  Constitutional: Negative for activity change and appetite change.  HENT: Negative for sinus pressure and sore throat.   Eyes: Negative for visual disturbance.  Respiratory: Negative for cough, chest tightness and shortness of breath.   Cardiovascular: Negative for chest pain and leg swelling.  Gastrointestinal: Negative for abdominal distention, abdominal pain, constipation and diarrhea.  Endocrine: Negative.   Genitourinary: Positive for frequency. Negative for dysuria.  Musculoskeletal: Negative for joint swelling and myalgias.  Skin: Negative for rash.  Allergic/Immunologic: Negative.   Neurological: Negative for weakness, light-headedness and numbness.  Psychiatric/Behavioral: Negative for dysphoric mood and suicidal ideas.    Objective:  BP (!) 144/97 (BP Location: Left Arm, Patient Position: Sitting, Cuff Size: Normal)   Pulse (!) 104   Temp 98.4 F (36.9 C) (Oral)   Ht 5' 8"  (1.727 m)   Wt 170 lb (77.1 kg)   SpO2 96%   BMI 25.85 kg/m   BP/Weight 02/16/2019 14/97/0263 09/16/5883  Systolic BP 027 741 287  Diastolic BP 97 78 73  Wt. (Lbs) 170 158.51 158.6  BMI 25.85 24.1 24.12      Physical Exam  Constitutional:      Appearance: He is well-developed.  Neck:     Vascular: No JVD.  Cardiovascular:     Rate and Rhythm: Normal rate.     Heart sounds: Normal heart sounds. No murmur.  Pulmonary:     Effort: Pulmonary effort is normal.     Breath sounds: Normal breath sounds. No wheezing or rales.  Chest:     Chest wall: No tenderness.  Abdominal:     General: Bowel sounds are normal. There is no distension.     Palpations: Abdomen is soft. There is no mass.     Tenderness: There is no abdominal tenderness.  Musculoskeletal: Normal range of motion.  Right lower leg: No edema.     Left lower leg: No edema.  Neurological:     Mental Status: He is alert and oriented to person, place, and time.  Psychiatric:        Mood and Affect: Mood normal.     CMP Latest Ref Rng & Units 01/05/2019 07/24/2018 01/04/2018  Glucose 70 - 99 mg/dL 150(H) 159(H) 174(H)  BUN 6 - 20 mg/dL 15 21 17   Creatinine 0.61 - 1.24 mg/dL 0.91 1.23 1.05  Sodium 135 - 145 mmol/L 138 140 136  Potassium 3.5 - 5.1 mmol/L 4.4 4.8 4.4  Chloride 98 - 111 mmol/L 106 105 105  CO2 22 - 32 mmol/L 22 21 23   Calcium 8.9 - 10.3 mg/dL 9.1 9.9 9.0  Total Protein 6.0 - 8.5 g/dL - 7.5 7.4  Total Bilirubin 0.0 - 1.2 mg/dL - 0.5 0.9  Alkaline Phos 39 - 117 IU/L - 105 92  AST 0 - 40 IU/L - 15 19  ALT 0 - 44 IU/L - 13 13    Lipid Panel     Component Value Date/Time   CHOL 215 (H) 07/24/2018 1157   TRIG 68 07/24/2018 1157   HDL 72 07/24/2018 1157   CHOLHDL 3.0 07/24/2018 1157   CHOLHDL 3.6 05/22/2016 0852   VLDL 32 (H) 05/03/2015 0954   LDLCALC 129 (H) 07/24/2018 1157    CBC    Component Value Date/Time   WBC 6.7 01/05/2019 1201   RBC 4.40 01/05/2019 1201   HGB 13.7 01/05/2019 1201   HCT 42.0 01/05/2019 1201   PLT 361 01/05/2019 1201   MCV 95.5 01/05/2019 1201   MCH 31.1 01/05/2019 1201   MCHC 32.6 01/05/2019 1201   RDW 12.8 01/05/2019 1201   LYMPHSABS 1.7 12/21/2017 1128   MONOABS 0.4 12/21/2017 1128    EOSABS 0.1 12/21/2017 1128   BASOSABS 0.0 12/21/2017 1128    Lab Results  Component Value Date   HGBA1C 11.9 (A) 02/16/2019      Assessment & Plan:   1. Uncontrolled type 2 diabetes mellitus with hyperglycemia (HCC) Uncontrolled with severe hyperglycemia of greater than 600 in the clinic, ketones present in urine, A1c of 11.9 due to noncompliance Referred to the ED stat for impending DKA as he will need IV fluids, insulin Compliance has been emphasized He does have refills in the pharmacy and just needs to pick them up I will see him back to follow-up on his blood sugar also on adjust regimen accordingly We have discussed insulin initiation which he is not open to at this time and promises to get back on his oral medications - Glucose (CBG) - HgB A1c - POCT urinalysis dipstick - Blood Glucose Monitoring Suppl (ACCU-CHEK GUIDE ME) w/Device KIT; 1 each by Does not apply route 3 (three) times daily before meals. Dx: Type 2 diabetes mellitus  Dispense: 1 kit; Refill: 0 - glucose blood (ACCU-CHEK GUIDE) test strip; Use as instructed 3 times daily.  Diagnosis-type 2 diabetes mellitus  Dispense: 100 each; Refill: 12  2. Non-compliance Poor compliance due to homelessness He has 6 refills of his medications which are written previously Discussed implications of noncompliance and complications of uncontrolled diabetes mellitus    Meds ordered this encounter  Medications  . Blood Glucose Monitoring Suppl (ACCU-CHEK GUIDE ME) w/Device KIT    Sig: 1 each by Does not apply route 3 (three) times daily before meals. Dx: Type 2 diabetes mellitus    Dispense:  1 kit  Refill:  0    Please dispense lancets  . glucose blood (ACCU-CHEK GUIDE) test strip    Sig: Use as instructed 3 times daily.  Diagnosis-type 2 diabetes mellitus    Dispense:  100 each    Refill:  12    Follow-up: Return in about 1 month (around 03/19/2019) for follow up on Hyperglycemia - in person.       [name], MD, FAAFP. [facility] [place], [place]   02/16/2019, 4:26 PM

## 2019-02-16 NOTE — ED Provider Notes (Signed)
[facility] EMERGENCY DEPARTMENT Provider Note   CSN: [id number] Arrival date & time: 02/16/19  1653     History   Chief Complaint Chief Complaint  Patient presents with  . Hyperglycemia    HPI David Wyatt is a 58 y.o. male with PMHx HTN, high cholesterol, Type II diabetes, GERD, who presents to the ED today for hyperglycemia.  Patient had a follow-up visit with his PCP today and was noted to be hyperglycemic greater than 600 with a1c 11.9%.  He was sent here for further evaluation with concern for DKA.  Patient has been out of his medications for the past 3 months due to homelessness.  He states he is currently residing at a group home for the past week and is planning to be there for the next 3 months.  He has prescriptions that are active and 6 refills of all of his medications.  States he is ready to be back on his medications. He does endorse that he has been eating "a lot of sweets" lately. He also endorses soda use and pastas. He has no physical complaints at this time besides polyuria and polydipsia.  Denies fever, chills, chest pain, shortness of breath, abdominal pain, nausea, vomiting, diarrhea, dysuria, hematuria, any other associated symptoms.       Past Medical History:  Diagnosis Date  . Angioedema of lips 07/28/2012   left upper (07/29/2012)  . Arthritis    hands and back  . Chronic back pain   . Cocaine abuse ([place])   . Diabetic neuropathy ([place])   . High cholesterol   . Hypertension   . Neuropathy   . Type II diabetes mellitus [facility])     Patient Active Problem List   Diagnosis Date Noted  . Tobacco abuse 08/19/2018  . Hyperlipidemia 04/24/2017  . GERD (gastroesophageal reflux disease) 06/13/2015  . Controlled type 2 diabetes mellitus with complication, without long-term current use of insulin ([place]) 05/01/2015  . Diabetic neuropathy ([place]) 05/01/2015  . Essential hypertension, benign 11/01/2013  . Lumbar disc herniation 09/13/2013  .  Unspecified constipation 09/13/2013  . Diabetes mellitus due to underlying condition without complications ([place]) 68/10/8108  . Chronic back pain 09/13/2013  . Viral gastroenteritis 05/05/2013  . Hypoglycemia 05/05/2013  . [place]on of finger of left hand with lymphangitis 12/13/2012  . Hyperglycemia 07/29/2012  . Angioedema of lips 07/29/2012  . Crack cocaine use 07/29/2012  . Arthritis 07/29/2012  . Weakness generalized 07/29/2012    Past Surgical History:  Procedure Laterality Date  . BACK SURGERY    . HERNIA REPAIR Right 04/01/2012  . I&D EXTREMITY Left 12/13/2012   Procedure: IRRIGATION AND DEBRIDEMENT LEFT THUMB;  Surgeon: Tennis Must, MD;  Location: [place];  Service: Orthopedics;  Laterality: Left;  . INCISION AND DRAINAGE OF WOUND     boil on back/notes 07/14/2008 (07/29/2012)  . INGUINAL HERNIA REPAIR  04/01/2012   Procedure: HERNIA REPAIR INGUINAL ADULT;  Surgeon: Haywood Lasso, MD;  Location: [facility];  Service: General;  Laterality: Right;  . INGUINAL HERNIA REPAIR Left 09/06/2016   Procedure: OPEN REPAIR LEFT INGUINAL HERNIA;  Surgeon: Greer Pickerel, MD;  Location: [place];  Service: General;  Laterality: Left;  . INSERTION OF MESH Left 09/06/2016   Procedure: INSERTION OF MESH;  Surgeon: Greer Pickerel, MD;  Location: [facility];  Service: General;  Laterality: Left;  . TONSILLECTOMY          Home Medications    Prior to Admission medications  Medication Sig Start Date End Date Taking? Authorizing Provider  amLODipine (NORVASC) 5 MG tablet Take 1 tablet (5 mg total) by mouth daily. To lower blood pressure 11/17/18  Yes Newlin, Enobong, MD  atorvastatin (LIPITOR) 40 MG tablet Take 1 tablet (40 mg total) by mouth daily. 11/17/18  Yes [name], MD  Blood Glucose Monitoring Suppl (ACCU-CHEK GUIDE ME) w/Device KIT 1 each by Does not apply route 3 (three) times daily before meals. Dx: Type 2 diabetes mellitus 02/16/19  Yes Newlin, Enobong, MD  gabapentin (NEURONTIN) 300 MG capsule Take  2 capsules (600 mg total) by mouth 3 (three) times daily. 01/05/19  Yes Davonna Belling, MD  glipiZIDE (GLUCOTROL XL) 10 MG 24 hr tablet Take 1 tablet (10 mg total) by mouth daily with breakfast. 11/17/18  Yes Newlin, Enobong, MD  glucose blood (ACCU-CHEK GUIDE) test strip Use as instructed 3 times daily.  Diagnosis-type 2 diabetes mellitus 02/16/19  Yes Newlin, Charlane Ferretti, MD  glucose blood (TRUE METRIX BLOOD GLUCOSE TEST) test strip Use as instructed to check blood sugar up to twice per day 07/24/18  Yes Fulp, Cammie, MD  metFORMIN (GLUCOPHAGE) 500 MG tablet Take 2 tablets (1,000 mg total) by mouth 2 (two) times daily with a meal. 11/17/18  Yes [name], MD  TRUEplus Lancets 28G MISC Use once or twice per day to check blood sugar 07/24/18  Yes Fulp, Cammie, MD  Blood Glucose Monitoring Suppl (TRUE METRIX METER) w/Device KIT Use as directed to check blood sugars up to twice per day Patient not taking: Reported on 02/16/2019 07/24/18   Fulp, Cammie, MD  DULoxetine (CYMBALTA) 60 MG capsule Take 1 capsule (60 mg total) by mouth daily. Patient not taking: Reported on 02/16/2019 01/05/19   Davonna Belling, MD  fluticasone [facility]) 50 MCG/ACT nasal spray Place 2 sprays into both nostrils daily. Patient not taking: Reported on 02/16/2019 06/03/17   [name], MD  lidocaine (LIDODERM) 5 % Place 1 patch onto the skin daily. Remove & Discard patch within 12 hours or as directed by MD Patient not taking: Reported on 02/16/2019 06/05/17   [name], MD  loratadine (CLARITIN) 10 MG tablet Take 1 tablet (10 mg total) by mouth daily. Patient not taking: Reported on 02/16/2019 06/03/17   [name], MD  meclizine (ANTIVERT) 25 MG tablet Take 1 tablet (25 mg total) by mouth 3 (three) times daily as needed for dizziness. Patient not taking: Reported on 02/16/2019 06/03/17   [name], MD  naproxen (NAPROSYN) 500 MG tablet Take 1 tablet (500 mg total) by mouth 2 (two) times daily with a meal.  Patient not taking: Reported on 02/16/2019 11/17/18   [name], MD  ondansetron (ZOFRAN ODT) 4 MG disintegrating tablet Take 1 tablet (4 mg total) by mouth every 8 (eight) hours as needed for nausea or vomiting. Patient not taking: Reported on 02/16/2019 01/04/18   Rodell Perna A, PA-C  predniSONE (DELTASONE) 20 MG tablet Take 2 tablets (40 mg total) by mouth daily. Patient not taking: Reported on 02/16/2019 01/05/19   Davonna Belling, MD  tiZANidine (ZANAFLEX) 4 MG tablet Take 1 tablet (4 mg total) by mouth at bedtime. As needed for muscle spasm Patient not taking: Reported on 02/16/2019 11/17/18   [name], MD    Family History Family History  Problem Relation Age of Onset  . Kidney disease Mother   . Diabetes Father     Social History Social History   Tobacco Use  . Smoking status: Current Every Day Smoker  Packs/day: 0.25    Years: 30.00    Pack years: 7.50    Types: Cigarettes  . Smokeless tobacco: Former Systems developer    Quit date: 08/15/2015  . Tobacco comment: 04/29/16 2  cigs daily, 10/27/17 sometimes < .25 PPD  Substance Use Topics  . Alcohol use: No    Alcohol/week: 0.0 standard drinks  . Drug use: Yes    Types: "Crack" cocaine    Comment: last use 08/23/16     Allergies   Lisinopril   Review of Systems Review of Systems  Constitutional: Negative for chills and fever.  HENT: Negative for congestion.   Eyes: Negative for visual disturbance.  Respiratory: Negative for cough and shortness of breath.   Cardiovascular: Negative for chest pain.  Gastrointestinal: Negative for abdominal pain, nausea and vomiting.  Endocrine: Positive for polydipsia and polyuria.  Genitourinary: Negative for difficulty urinating.  Musculoskeletal: Negative for myalgias.  Skin: Negative for rash.  Neurological: Negative for headaches.     Physical Exam Updated Vital Signs BP (!) 145/102   Pulse 94   Temp 98.1 F (36.7 C) (Oral)   Resp 16   Ht _0  (1.727 m)   Wt 77.1 kg    SpO2 97%   BMI 25.85 kg/m   Physical Exam Vitals signs and nursing note reviewed.  Constitutional:      Appearance: He is not ill-appearing or diaphoretic.  HENT:     Head: Normocephalic and atraumatic.     Mouth/Throat:     Mouth: Mucous membranes are dry.  Eyes:     Conjunctiva/sclera: Conjunctivae normal.  Neck:     Musculoskeletal: Neck supple.  Cardiovascular:     Rate and Rhythm: Normal rate and regular rhythm.     Pulses: Normal pulses.  Pulmonary:     Effort: Pulmonary effort is normal.     Breath sounds: Normal breath sounds. No wheezing, rhonchi or rales.  Abdominal:     Palpations: Abdomen is soft.     Tenderness: There is no abdominal tenderness. There is no guarding or rebound.  Musculoskeletal:     Right lower leg: No edema.     Left lower leg: No edema.  Skin:    General: Skin is warm and dry.  Neurological:     Mental Status: He is alert.      ED Treatments / Results  Labs (all labs ordered are listed, but only abnormal results are displayed) Labs Reviewed  COMPREHENSIVE METABOLIC PANEL - Abnormal; Notable for the following components:      Result Value   Sodium 132 (*)    Chloride 96 (*)    Glucose, Bld 602 (*)    BUN 22 (*)    Creatinine, Ser 1.63 (*)    GFR calc non Af Amer 45 (*)    GFR calc Af Amer 53 (*)    All other components within normal limits  CBG MONITORING, ED - Abnormal; Notable for the following components:   Glucose-Capillary 579 (*)    All other components within normal limits  POCT I-STAT EG7 - Abnormal; Notable for the following components:   Sodium 131 (*)    All other components within normal limits  CBG MONITORING, ED - Abnormal; Notable for the following components:   Glucose-Capillary 399 (*)    All other components within normal limits  CBG MONITORING, ED - Abnormal; Notable for the following components:   Glucose-Capillary 363 (*)    All other components within normal limits  CBG MONITORING,  ED - Abnormal;  Notable for the following components:   Glucose-Capillary 327 (*)    All other components within normal limits  CBC WITH DIFFERENTIAL/PLATELET  I-STAT VENOUS BLOOD GAS, ED    EKG None  Radiology No results found.  Procedures Procedures (including critical care time)  Medications Ordered in ED Medications  insulin aspart (novoLOG) injection 5 Units (has no administration in time range)  sodium chloride 0.9 % bolus 1,000 mL (1,000 mLs Intravenous New Bag/Given 02/16/19 2350)  insulin aspart (novoLOG) injection 10 Units (10 Units Subcutaneous Given 02/16/19 2227)     Initial Impression / Assessment and Plan / ED Course  I have reviewed the triage vital signs and the nursing notes.  Pertinent labs & imaging results that were available during my care of the patient were reviewed by me and considered in my medical decision making (see chart for details).    58 year old male presents the ED for hyperglycemia.  Sent from PCPs office blood glucose greater than 600, has been out of his medications including Metformin and glipizide for the past 3 months due to homelessness.  Arrival patient CBG 579.   Blood work obtained prior to being seen -BC without leukocytosis.  Hemoglobin stable.  CMP with sodium 132 although with correction 140.  Glucose 602, creatinine elevated 1.63 (baseline ~ 1.0) BUN 22.  Bicarb within normal limits at 24.  Gap of 20 with sodium correction. VBG with normal pH.   Repeat CBG 363 as patient brought back from the waiting room.  Has not received any of the medications currently.  Initially glucose stabilizer ordered.  Given patient does not appear to be in DKA and glucose has been slowly decreasing without medication feel that he would benefit from IV fluids as well as subcutaneous insulin with recheck. Discussed case with attending physician Dr. Vanita Panda who agrees with plan.    12:03 AM Pt has just recently received IV fluids; repeat CBG 327 prior to fluids. Will  order another 5 units subque insulin.  At shift change case signed out to Quincy Carnes, PA-C, who will dispo patient accordingly.       Final Clinical Impressions(s) / ED Diagnoses   Final diagnoses:  Hyperglycemia  Noncompliance  AKI (acute kidney injury) [facility])    ED Discharge Orders    None       Eustaquio Maize, PA-C 02/17/19 0012    Carmin Muskrat, MD 02/17/19 [phone]

## 2019-02-16 NOTE — Patient Instructions (Signed)
Hyperglycemia Hyperglycemia is when the sugar (glucose) level in your blood is too high. It may not cause symptoms. If you do have symptoms, they may include warning signs, such as:  Feeling more thirsty than normal.  Hunger.  Feeling tired.  Needing to pee (urinate) more than normal.  Blurry eyesight (vision). You may get other symptoms as it gets worse, such as:  Dry mouth.  Not being hungry (loss of appetite).  Fruity-smelling breath.  Weakness.  Weight gain or loss that is not planned. Weight loss may be fast.  A tingling or numb feeling in your hands or feet.  Headache.  Skin that does not bounce back quickly when it is lightly pinched and released (poor skin turgor).  Pain in your belly (abdomen).  Cuts or bruises that heal slowly. High blood sugar can happen to people who do or do not have diabetes. High blood sugar can happen slowly or quickly, and it can be an emergency. Follow these instructions at home: General instructions  Take over-the-counter and prescription medicines only as told by your doctor.  Do not use products that contain nicotine or tobacco, such as cigarettes and e-cigarettes. If you need help quitting, ask your doctor.  Limit alcohol intake to no more than 1 drink per day for nonpregnant women and 2 drinks per day for men. One drink equals 12 oz of beer, 5 oz of wine, or 1 oz of hard liquor.  Manage stress. If you need help with this, ask your doctor.  Keep all follow-up visits as told by your doctor. This is important. Eating and drinking   Stay at a healthy weight.  Exercise regularly, as told by your doctor.  Drink enough fluid, especially when you: ? Exercise. ? Get sick. ? Are in hot temperatures.  Eat healthy foods, such as: ? Low-fat (lean) proteins. ? Complex carbs (complex carbohydrates), such as whole wheat bread or brown rice. ? Fresh fruits and vegetables. ? Low-fat dairy products. ? Healthy fats.  Drink enough  fluid to keep your pee (urine) clear or pale yellow. If you have diabetes:   Make sure you know the symptoms of hyperglycemia.  Follow your diabetes management plan, as told by your doctor. Make sure you: ? Take insulin and medicines as told. ? Follow your exercise plan. ? Follow your meal plan. Eat on time. Do not skip meals. ? Check your blood sugar as often as told. Make sure to check before and after exercise. If you exercise longer or in a different way than you normally do, check your blood sugar more often. ? Follow your sick day plan whenever you cannot eat or drink normally. Make this plan ahead of time with your doctor.  Share your diabetes management plan with people in your workplace, school, and household.  Check your urine for ketones when you are ill and as told by your doctor.  Carry a card or wear jewelry that says that you have diabetes. Contact a doctor if:  Your blood sugar level is higher than 240 mg/dL (13.3 mmol/L) for 2 days in a row.  You have problems keeping your blood sugar in your target range.  High blood sugar happens often for you. Get help right away if:  You have trouble breathing.  You have a change in how you think, feel, or act (mental status).  You feel sick to your stomach (nauseous), and that feeling does not go away.  You cannot stop throwing up (vomiting). These symptoms may   be an emergency. Do not wait to see if the symptoms will go away. Get medical help right away. Call your local emergency services (911 in the [place] Do not drive yourself to the hospital. Summary  Hyperglycemia is when the sugar (glucose) level in your blood is too high.  High blood sugar can happen to people who do or do not have diabetes.  Make sure you drink enough fluids, eat healthy foods, and exercise regularly.  Contact your doctor if you have problems keeping your blood sugar in your target range. This information is not intended to replace advice given  to you by your health care provider. Make sure you discuss any questions you have with your health care provider. Document Released: 12/23/2008 Document Revised: 11/13/2015 Document Reviewed: 11/13/2015 Elsevier Patient Education  2020 [employer].

## 2019-02-16 NOTE — ED Triage Notes (Signed)
Pt from [place] with hyperglycemia. Reports he has not been taking his medications for a month.

## 2019-02-16 NOTE — ED Provider Notes (Signed)
MSE was initiated and I personally evaluated the patient and placed orders (if any) at  5:03 PM on February 16, 2019.  The patient appears stable so that the remainder of the MSE may be completed by another provider.  Patient presents today for evaluation of hyperglycemia.  He was originally seen at [facility] where a CBG was over 600.  UA was obtained they and he was referred here.  He reports that he has been out of his medications for approximately 1 month and has been eating candy and sweets.  He reports polydipsia and polyuria.  Labs were ordered.  Patient is mildly tachycardic however is generally well-appearing  He is aware that his care has been initiated.  We discussed the importance of staying for entire evaluation and risks of failing to do so and he states his understanding.   Lorin Glass, PA-C 02/16/19 1705    Lajean Saver, MD 02/16/19 2302

## 2019-02-16 NOTE — ED Notes (Signed)
GLUCOSE   602

## 2019-02-17 LAB — CBG MONITORING, ED: Glucose-Capillary: 218 mg/dL — ABNORMAL HIGH (ref 70–99)

## 2019-02-17 MED FILL — AMLODIPINE BESYLATE 5 MG TA: 5 | 30 days supply | Qty: 30 | Fill #0

## 2019-02-17 NOTE — Discharge Instructions (Addendum)
Please pick up all of your medications at the pharmacy and take as prescribed Increase water intake for the next week and have your kidney levels rechecked by PCP in 1-2 weeks  Return here for any new/acute changes.

## 2019-02-17 NOTE — ED Provider Notes (Signed)
Assumed care from David Wyatt at shift change.  See prior notes for full H&P.  58 y.o. M here with hyperglycemia.  Has been out of medications for the past 3 months, saw PCP who did refill medications and just needs to pick them up from the pharmacy.  Hyperglycemia here without findings of DKA.  Given some SQ insulin with improvement.  Plan:  Given IVF and additional 5 units insulin.  Will re-check sugars and likely discharge.  3:00 AM After additional fluids and insulin CBG improved to 218.  No signs of DKA.  Patient resting comfortably, VSS.  Appears stable for discharge.  PCP has already sent medications to pharmacy for pick up.  Will need close monitoring of CBG, increase water intake from mild AKI.  Will follow-up with PCP for recheck of renal function in 1-2 weeks.  May return here for any new/acute changes.   David Pickett, PA-C 02/17/19 David Wyatt, April, MD 02/17/19 [phone]

## 2019-02-24 MED FILL — glipiZIDE XL 10 MG TB24: 10 | 30 days supply | Qty: 30 | Fill #0

## 2019-02-26 MED FILL — AMLODIPINE BESYLATE 5 MG TA: 5 | 30 days supply | Qty: 30 | Fill #0

## 2019-03-01 ENCOUNTER — Telehealth: Payer: Self-pay | Admitting: Family Medicine

## 2019-03-01 NOTE — Telephone Encounter (Signed)
Pt called to request a change in medications, he states his sugar levels have continued to remain high and would like to be switched over to insulin as previously discussed with provider. Please follow up  Pt no longer wants to take -metFORMIN (GLUCOPHAGE) 500 MG tablet  As he does not think its working

## 2019-03-01 NOTE — Telephone Encounter (Signed)
Will route to PCP for review. 

## 2019-03-01 NOTE — Telephone Encounter (Signed)
Please obtain his blood sugar readings from him so I can make a decision. Please advise against discontinuing Metformin and Glipizide for now as insulin initiation requires an in person instruction.He has an upcoming appointment with me. Thanks

## 2019-03-02 NOTE — Telephone Encounter (Signed)
Patient was called and a voicemail was left informing patient to return phone call.  Patient is needing to report recent blood sugars.

## 2019-03-17 MED FILL — ACCU-CHEK GUIDE TEST STRIP: 33 days supply | Qty: 100 | Fill #0

## 2019-03-23 ENCOUNTER — Ambulatory Visit: Payer: Medicaid Other | Admitting: Family Medicine

## 2019-04-12 ENCOUNTER — Telehealth: Payer: Self-pay

## 2019-04-12 ENCOUNTER — Other Ambulatory Visit: Payer: Self-pay

## 2019-04-12 ENCOUNTER — Encounter: Payer: Self-pay | Admitting: Family Medicine

## 2019-04-12 ENCOUNTER — Ambulatory Visit: Payer: Medicaid Other | Attending: Family Medicine | Admitting: Family Medicine

## 2019-04-12 VITALS — BP 148/104 | HR 87 | Temp 97.6°F | Resp 18 | Ht 68.0 in | Wt 174.0 lb

## 2019-04-12 DIAGNOSIS — L723 Sebaceous cyst: Secondary | ICD-10-CM | POA: Diagnosis not present

## 2019-04-12 DIAGNOSIS — M1990 Unspecified osteoarthritis, unspecified site: Secondary | ICD-10-CM | POA: Insufficient documentation

## 2019-04-12 DIAGNOSIS — E1165 Type 2 diabetes mellitus with hyperglycemia: Secondary | ICD-10-CM | POA: Insufficient documentation

## 2019-04-12 DIAGNOSIS — Z833 Family history of diabetes mellitus: Secondary | ICD-10-CM | POA: Insufficient documentation

## 2019-04-12 DIAGNOSIS — Z1211 Encounter for screening for malignant neoplasm of colon: Secondary | ICD-10-CM | POA: Diagnosis not present

## 2019-04-12 DIAGNOSIS — I10 Essential (primary) hypertension: Secondary | ICD-10-CM | POA: Diagnosis not present

## 2019-04-12 DIAGNOSIS — E7800 Pure hypercholesterolemia, unspecified: Secondary | ICD-10-CM | POA: Insufficient documentation

## 2019-04-12 DIAGNOSIS — E1140 Type 2 diabetes mellitus with diabetic neuropathy, unspecified: Secondary | ICD-10-CM | POA: Insufficient documentation

## 2019-04-12 DIAGNOSIS — M62830 Muscle spasm of back: Secondary | ICD-10-CM | POA: Insufficient documentation

## 2019-04-12 DIAGNOSIS — Z79899 Other long term (current) drug therapy: Secondary | ICD-10-CM | POA: Diagnosis not present

## 2019-04-12 DIAGNOSIS — Z7984 Long term (current) use of oral hypoglycemic drugs: Secondary | ICD-10-CM | POA: Diagnosis not present

## 2019-04-12 DIAGNOSIS — M5126 Other intervertebral disc displacement, lumbar region: Secondary | ICD-10-CM

## 2019-04-12 DIAGNOSIS — E1149 Type 2 diabetes mellitus with other diabetic neurological complication: Secondary | ICD-10-CM

## 2019-04-12 LAB — GLUCOSE, POCT (MANUAL RESULT ENTRY): POC Glucose: 163 mg/dl — AB (ref 70–99)

## 2019-04-12 MED ORDER — DULOXETINE HCL 60 MG PO CPEP: 60.0000 mg | ORAL_CAPSULE | Freq: Every day | ORAL | 6 refills | Status: DC

## 2019-04-12 MED ORDER — GLIPIZIDE ER 10 MG PO TB24: 10.0000 mg | ORAL_TABLET | Freq: Every day | ORAL | 6 refills | Status: DC

## 2019-04-12 MED ORDER — ASPIRIN EC 81 MG PO TBEC: 81.0000 mg | DELAYED_RELEASE_TABLET | Freq: Every day | ORAL | 6 refills | Status: DC

## 2019-04-12 MED ORDER — TIZANIDINE HCL 4 MG PO TABS: 4.0000 mg | ORAL_TABLET | Freq: Every day | ORAL | 5 refills | Status: DC

## 2019-04-12 MED ORDER — GABAPENTIN 300 MG PO CAPS: 600.0000 mg | ORAL_CAPSULE | Freq: Every day | ORAL | 6 refills | Status: DC

## 2019-04-12 MED ORDER — JARDIANCE 10 MG PO TABS: 10.0000 mg | ORAL_TABLET | Freq: Every day | ORAL | 6 refills | Status: DC

## 2019-04-12 MED ORDER — AMLODIPINE BESYLATE 10 MG PO TABS: 10.0000 mg | ORAL_TABLET | Freq: Every day | ORAL | 6 refills | Status: DC

## 2019-04-12 MED ORDER — ATORVASTATIN CALCIUM 40 MG PO TABS: 40.0000 mg | ORAL_TABLET | Freq: Every day | ORAL | 6 refills | Status: DC

## 2019-04-12 MED FILL — DULoxetine HCL 60 MG CPEP: 60 | 30 days supply | Qty: 30 | Fill #0

## 2019-04-12 MED FILL — JARDIANCE 10 MG TABLET: 10 | 30 days supply | Qty: 30 | Fill #0

## 2019-04-12 MED FILL — ATORVASTATIN CALCIUM 40 MG: 40 | 30 days supply | Qty: 30 | Fill #0

## 2019-04-12 MED FILL — GABAPENTIN 300 MG CAPSULE: 300 | 30 days supply | Qty: 60 | Fill #0

## 2019-04-12 MED FILL — tiZANidine HCL 4 MG TABS: 4 | 30 days supply | Qty: 30 | Fill #0

## 2019-04-12 MED FILL — AMLODIPINE BESYLATE 10 MG T: 10 | 30 days supply | Qty: 30 | Fill #0

## 2019-04-12 MED FILL — glipiZIDE XL 10 MG TB24: 10 | 30 days supply | Qty: 30 | Fill #0

## 2019-04-12 NOTE — Telephone Encounter (Signed)
Met with the patient when he was in the clinic today.  He explained that he has been in an apartment for 2 months and only has 1 more month there. He said that he found this place from a sign in the front of the yard and housing would be provided free for 3 months. He said that it was not a part of any type of service/support program that he is aware of.  He had stayed with friends or in a car prior to this housing and he no longer has a car.  He receives $790/month disability.   Provided him with the housing listing from socialserve.com as well as the phone numbers for [facility], [facility] and the [employer]. He has a phone to make calls.

## 2019-04-12 NOTE — Patient Instructions (Signed)

## 2019-04-12 NOTE — Progress Notes (Signed)
Patient is needing assistance with housing.

## 2019-04-12 NOTE — Progress Notes (Signed)
Subjective:  Patient ID: David Wyatt, male    DOB: [birth year]  Age: 59 y.o. MRN: [id number]  CC: Diabetes   HPI David Wyatt is a 59 year old male with a history of type 2 diabetes mellitus (A1c 11.9), diabetic neuropathy, hypertension, chronic low back pain (status post previous back surgery) who comes into the clinic for a follow-up visit. At his last visit I had referred him to the ED for hyperglycemia of 600 and ketonuria after he had been out of medications for 3 months prior to that visit. He was treated with IV fluids and insulin with improvement in his blood sugars.  Today he endorses resuming his medications but has not been taking Metformin due to GI side effects.  Fasting blood sugars have ranged between 130 and 169 and his highest random sugar was 233. He complains of right-sided numbness in his hand and his leg but denies presence of weakness.  Symptoms have been present for a couple of months he states.  He had a CT head in 03/2017 which was unremarkable.  Denies presence of slurred speech or abnormal gait. Of note he had been on gabapentin and Cymbalta for diabetic neuropathy as well as neuropathic symptoms associated with lumbar disc herniation but he has not been taking these.  He stopped gabapentin due to the fact that it made him dizzy. His blood pressure is elevated today and he endorses taking his antihypertensive.  Past Medical History:  Diagnosis Date  . Angioedema of lips 07/28/2012   left upper (07/29/2012)  . Arthritis    hands and back  . Chronic back pain   . Cocaine abuse ([place])   . Diabetic neuropathy ([place])   . High cholesterol   . Hypertension   . Neuropathy   . Type II diabetes mellitus ([place])     Past Surgical History:  Procedure Laterality Date  . BACK SURGERY    . HERNIA REPAIR Right 04/01/2012  . I & D EXTREMITY Left 12/13/2012   Procedure: IRRIGATION AND DEBRIDEMENT LEFT THUMB;  Surgeon: Tennis Must, MD;  Location: [facility];  Service: Orthopedics;   Laterality: Left;  . INCISION AND DRAINAGE OF WOUND     boil on back/notes 07/14/2008 (07/29/2012)  . INGUINAL HERNIA REPAIR  04/01/2012   Procedure: HERNIA REPAIR INGUINAL ADULT;  Surgeon: Haywood Lasso, MD;  Location: [place] [facility];  Service: General;  Laterality: Right;  . INGUINAL HERNIA REPAIR Left 09/06/2016   Procedure: OPEN REPAIR LEFT INGUINAL HERNIA;  Surgeon: Greer Pickerel, MD;  Location: [place];  Service: General;  Laterality: Left;  . INSERTION OF MESH Left 09/06/2016   Procedure: INSERTION OF MESH;  Surgeon: Greer Pickerel, MD;  Location: [facility];  Service: General;  Laterality: Left;  . TONSILLECTOMY      Family History  Problem Relation Age of Onset  . Kidney disease Mother   . Diabetes Father     Allergies  Allergen Reactions  . Lisinopril Other (See Comments)    Lip Swelling, angioedema    Outpatient Medications Prior to Visit  Medication Sig Dispense Refill  . Blood Glucose Monitoring Suppl (ACCU-CHEK GUIDE ME) w/Device KIT 1 each by Does not apply route 3 (three) times daily before meals. Dx: Type 2 diabetes mellitus 1 kit 0  . Blood Glucose Monitoring Suppl (ACCU-CHEK GUIDE) w/Device KIT USE AS DIRECTED TO CHECK BLOOD SUGARS UP TO TWICE PER DAY 1 kit 0  . fluticasone (FLONASE) 50 MCG/ACT nasal spray Place 2 sprays into both nostrils daily.  16 g 0  . glucose blood (ACCU-CHEK GUIDE) test strip Use as instructed 3 times daily.  Diagnosis-type 2 diabetes mellitus 100 each 12  . glucose blood (TRUE METRIX BLOOD GLUCOSE TEST) test strip Use as instructed to check blood sugar up to twice per day 60 each 6  . lidocaine (LIDODERM) 5 % Place 1 patch onto the skin daily. Remove & Discard patch within 12 hours or as directed by MD 30 patch 0  . loratadine (CLARITIN) 10 MG tablet Take 1 tablet (10 mg total) by mouth daily. 30 tablet 0  . TRUEplus Lancets 28G MISC Use once or twice per day to check blood sugar 60 each 6  . amLODipine (NORVASC) 5 MG tablet Take 1 tablet (5 mg total) by mouth  daily. To lower blood pressure 30 tablet 5  . atorvastatin (LIPITOR) 40 MG tablet Take 1 tablet (40 mg total) by mouth daily. 30 tablet 5  . DULoxetine (CYMBALTA) 60 MG capsule Take 1 capsule (60 mg total) by mouth daily. 30 capsule 3  . gabapentin (NEURONTIN) 300 MG capsule Take 2 capsules (600 mg total) by mouth 3 (three) times daily. 180 capsule 3  . glipiZIDE (GLUCOTROL XL) 10 MG 24 hr tablet Take 1 tablet (10 mg total) by mouth daily with breakfast. 30 tablet 6  . metFORMIN (GLUCOPHAGE) 500 MG tablet Take 2 tablets (1,000 mg total) by mouth 2 (two) times daily with a meal. 120 tablet 6  . meclizine (ANTIVERT) 25 MG tablet Take 1 tablet (25 mg total) by mouth 3 (three) times daily as needed for dizziness. (Patient not taking: Reported on 02/16/2019) 30 tablet 0  . naproxen (NAPROSYN) 500 MG tablet Take 1 tablet (500 mg total) by mouth 2 (two) times daily with a meal. (Patient not taking: Reported on 02/16/2019) 60 tablet 3  . ondansetron (ZOFRAN ODT) 4 MG disintegrating tablet Take 1 tablet (4 mg total) by mouth every 8 (eight) hours as needed for nausea or vomiting. (Patient not taking: Reported on 02/16/2019) 9 tablet 0  . predniSONE (DELTASONE) 20 MG tablet Take 2 tablets (40 mg total) by mouth daily. (Patient not taking: Reported on 02/16/2019) 6 tablet 0  . tiZANidine (ZANAFLEX) 4 MG tablet Take 1 tablet (4 mg total) by mouth at bedtime. As needed for muscle spasm (Patient not taking: Reported on 02/16/2019) 30 tablet 5   No facility-administered medications prior to visit.     ROS Review of Systems  Constitutional: Negative for activity change and appetite change.  HENT: Negative for sinus pressure and sore throat.   Eyes: Negative for visual disturbance.  Respiratory: Negative for cough, chest tightness and shortness of breath.   Cardiovascular: Negative for chest pain and leg swelling.  Gastrointestinal: Negative for abdominal distention, abdominal pain, constipation and diarrhea.    Endocrine: Negative.   Genitourinary: Negative for dysuria.  Musculoskeletal: Negative for joint swelling and myalgias.  Skin: Negative for rash.  Allergic/Immunologic: Negative.   Neurological: Negative for weakness, light-headedness and numbness.  Psychiatric/Behavioral: Negative for dysphoric mood and suicidal ideas.    Objective:  BP (!) 148/104 (BP Location: Left Arm, Patient Position: Sitting, Cuff Size: Large)   Pulse 87   Temp 97.6 F (36.4 C) (Oral)   Resp 18   Ht 5' 8"  (1.727 m)   Wt 174 lb (78.9 kg)   SpO2 100%   BMI 26.46 kg/m   BP/Weight 04/12/2019 02/17/2019 89/05/8099  Systolic BP 751 025 -  Diastolic BP 852 75 -  Wt. (  Lbs) 174 - 170  BMI 26.46 - 25.85      Physical Exam Constitutional:      Appearance: He is well-developed.  Neck:     Vascular: No JVD.  Cardiovascular:     Rate and Rhythm: Normal rate.     Heart sounds: Normal heart sounds. No murmur.  Pulmonary:     Effort: Pulmonary effort is normal.     Breath sounds: Normal breath sounds. No wheezing or rales.  Chest:     Chest wall: No tenderness.  Abdominal:     General: Bowel sounds are normal. There is no distension.     Palpations: Abdomen is soft. There is no mass.     Tenderness: There is no abdominal tenderness.  Musculoskeletal:        General: Normal range of motion.     Right lower leg: No edema.     Left lower leg: No edema.  Skin:    Comments: Nontender cyst on mid upper back which is fluctuant  Neurological:     Mental Status: He is alert and oriented to person, place, and time.  Psychiatric:        Mood and Affect: Mood normal.     CMP Latest Ref Rng & Units 02/16/2019 02/16/2019 01/05/2019  Glucose 70 - 99 mg/dL - 602(HH) 150(H)  BUN 6 - 20 mg/dL - 22(H) 15  Creatinine 0.61 - 1.24 mg/dL - 1.63(H) 0.91  Sodium 135 - 145 mmol/L 131(L) 132(L) 138  Potassium 3.5 - 5.1 mmol/L 4.3 4.6 4.4  Chloride 98 - 111 mmol/L - 96(L) 106  CO2 22 - 32 mmol/L - 24 22  Calcium 8.9 - 10.3  mg/dL - 9.2 9.1  Total Protein 6.5 - 8.1 g/dL - 7.6 -  Total Bilirubin 0.3 - 1.2 mg/dL - 0.4 -  Alkaline Phos 38 - 126 U/L - 117 -  AST 15 - 41 U/L - 23 -  ALT 0 - 44 U/L - 22 -    Lipid Panel     Component Value Date/Time   CHOL 215 (H) 07/24/2018 1157   TRIG 68 07/24/2018 1157   HDL 72 07/24/2018 1157   CHOLHDL 3.0 07/24/2018 1157   CHOLHDL 3.6 05/22/2016 0852   VLDL 32 (H) 05/03/2015 0954   LDLCALC 129 (H) 07/24/2018 1157    CBC    Component Value Date/Time   WBC 5.7 02/16/2019 1709   RBC 4.69 02/16/2019 1709   HGB 15.3 02/16/2019 1719   HCT 45.0 02/16/2019 1719   PLT 313 02/16/2019 1709   MCV 93.8 02/16/2019 1709   MCH 31.3 02/16/2019 1709   MCHC 33.4 02/16/2019 1709   RDW 12.6 02/16/2019 1709   LYMPHSABS 1.7 02/16/2019 1709   MONOABS 0.8 02/16/2019 1709   EOSABS 0.1 02/16/2019 1709   BASOSABS 0.0 02/16/2019 1709    Lab Results  Component Value Date   HGBA1C 11.9 (A) 02/16/2019    Assessment & Plan:  1. Uncontrolled type 2 diabetes mellitus with hyperglycemia (HCC) Uncontrolled with A1c of 11.9; goal is less than 7 (he had been out of his medications for 3 months prior to last A1c last month) Blood sugars reveal improvement He has not been taking Metformin due to GI side effects and I have discontinued this We will add on Jardiance; continue glipizide Counseled on Diabetic diet, my plate method, 086 minutes of moderate intensity exercise/week Blood sugar logs with fasting goals of 80-120 mg/dl, random of less than 180 and in the  event of sugars less than 60 mg/dl or greater than 400 mg/dl encouraged to notify the clinic. Advised on the need for annual eye exams, annual foot exams, Pneumonia vaccine. - Glucose (CBG) - glipiZIDE (GLUCOTROL XL) 10 MG 24 hr tablet; Take 1 tablet (10 mg total) by mouth daily with breakfast.  Dispense: 30 tablet; Refill: 6 - atorvastatin (LIPITOR) 40 MG tablet; Take 1 tablet (40 mg total) by mouth daily.  Dispense: 30 tablet;  Refill: 6  2. Encounter for screening colonoscopy - Ambulatory referral to Gastroenterology  3. Lumbar disc herniation He does have radiculopathy symptoms Has been off Gabapentin which I have refilled -advised to take this at night due to sedating side effects. Commence ASA 81 prophylactically - DULoxetine (CYMBALTA) 60 MG capsule; Take 1 capsule (60 mg total) by mouth daily.  Dispense: 30 capsule; Refill: 6 - gabapentin (NEURONTIN) 300 MG capsule; Take 2 capsules (600 mg total) by mouth at bedtime.  Dispense: 60 capsule; Refill: 6  4. Sebaceous cyst Discussed the option of referral to general surgery for excision but he would like to hold off at this time He knows to notify the clinic if he changes his mind.   5. Muscle spasm of back Stable - tiZANidine (ZANAFLEX) 4 MG tablet; Take 1 tablet (4 mg total) by mouth at bedtime. As needed for muscle spasm  Dispense: 30 tablet; Refill: 5  6. Other diabetic neurological complication associated with type 2 diabetes mellitus ([place]) Uncontrolled Uncontrolled Advised to resume gabapentin - gabapentin (NEURONTIN) 300 MG capsule; Take 2 capsules (600 mg total) by mouth at bedtime.  Dispense: 60 capsule; Refill: 6  7. Essential hypertension, benign Uncontrolled Amlodipine dose increase Counseled on blood pressure goal of less than 130/80, low-sodium, DASH diet, medication compliance, 150 minutes of moderate intensity exercise per week. Discussed medication compliance, adverse effects. - amLODipine (NORVASC) 10 MG tablet; Take 1 tablet (10 mg total) by mouth daily.  Dispense: 30 tablet; Refill: 6 - Basic Metabolic Panel    Health Care Maintenance: Offered FIT test which he declined; will refer to GI for colonoscopy Meds ordered this encounter  Medications  . aspirin EC 81 MG tablet    Sig: Take 1 tablet (81 mg total) by mouth daily.    Dispense:  30 tablet    Refill:  6  . DULoxetine (CYMBALTA) 60 MG capsule    Sig: Take 1 capsule (60 mg  total) by mouth daily.    Dispense:  30 capsule    Refill:  6  . tiZANidine (ZANAFLEX) 4 MG tablet    Sig: Take 1 tablet (4 mg total) by mouth at bedtime. As needed for muscle spasm    Dispense:  30 tablet    Refill:  5  . glipiZIDE (GLUCOTROL XL) 10 MG 24 hr tablet    Sig: Take 1 tablet (10 mg total) by mouth daily with breakfast.    Dispense:  30 tablet    Refill:  6  . gabapentin (NEURONTIN) 300 MG capsule    Sig: Take 2 capsules (600 mg total) by mouth at bedtime.    Dispense:  60 capsule    Refill:  6    Discontinue previous dose  . atorvastatin (LIPITOR) 40 MG tablet    Sig: Take 1 tablet (40 mg total) by mouth daily.    Dispense:  30 tablet    Refill:  6  . amLODipine (NORVASC) 10 MG tablet    Sig: Take 1 tablet (10 mg total) by mouth  daily.    Dispense:  30 tablet    Refill:  6    Follow-up: Return in about 3 months (around 07/10/2019) for Chronic medical conditions-in person.       [name], MD, FAAFP. [facility] [place], [place]   04/12/2019, 10:05 AM

## 2019-04-13 LAB — BASIC METABOLIC PANEL
BUN/Creatinine Ratio: 13 (ref 9–20)
BUN: 15 mg/dL (ref 6–24)
CO2: 21 mmol/L (ref 20–29)
Calcium: 10.3 mg/dL — ABNORMAL HIGH (ref 8.7–10.2)
Chloride: 101 mmol/L (ref 96–106)
Creatinine, Ser: 1.18 mg/dL (ref 0.76–1.27)
GFR calc Af Amer: 78 mL/min/{1.73_m2} (ref >59)
GFR calc non Af Amer: 67 mL/min/{1.73_m2} (ref >59)
Glucose: 152 mg/dL — ABNORMAL HIGH (ref 65–99)
Potassium: 4.8 mmol/L (ref 3.5–5.2)
Sodium: 141 mmol/L (ref 134–144)

## 2019-04-15 ENCOUNTER — Telehealth: Payer: Self-pay

## 2019-04-15 NOTE — Telephone Encounter (Signed)
Patient was called and a voicemail was left informing patient to return phone call for lab results. 

## 2019-04-15 NOTE — Telephone Encounter (Signed)
-----   Message from [name], MD sent at 04/13/2019  9:28 AM EST ----- Labs are stable

## 2019-04-16 NOTE — Telephone Encounter (Signed)
Patient was called and a voicemail was left informing patient to return phone call for lab results. 

## 2019-04-22 NOTE — Telephone Encounter (Signed)
Patient was called and a voicemail was left informing patient to return phone call for lab results. 

## 2019-04-22 NOTE — Telephone Encounter (Signed)
-----   Message from [name], MD sent at 04/13/2019  9:28 AM EST ----- Labs are stable

## 2019-04-23 NOTE — Telephone Encounter (Signed)
Patient called and was informed of lab result note. Patient verbalized understanding and had no further questions.  Patient stated that his PCP requested to speak with him but has not been able to reach her. Please fu at your earliest convenience.

## 2019-05-25 ENCOUNTER — Encounter: Payer: Self-pay | Admitting: Family Medicine

## 2019-06-15 MED FILL — AMLODIPINE BESYLATE 10 MG T: 10 | 30 days supply | Qty: 30 | Fill #1

## 2019-06-15 MED FILL — glipiZIDE XL 10 MG TB24: 10 | 30 days supply | Qty: 30 | Fill #1

## 2019-06-15 MED FILL — GABAPENTIN 300 MG CAPSULE: 300 | 30 days supply | Qty: 60 | Fill #1

## 2019-06-15 MED FILL — JARDIANCE 10 MG TABLET: 10 | 30 days supply | Qty: 30 | Fill #1

## 2019-06-15 MED FILL — ACCU-CHEK GUIDE TEST STRIP: 33 days supply | Qty: 100 | Fill #1

## 2019-07-12 ENCOUNTER — Ambulatory Visit: Payer: Medicaid Other | Admitting: Family Medicine

## 2019-09-01 ENCOUNTER — Encounter (HOSPITAL_COMMUNITY): Payer: Self-pay

## 2019-09-01 ENCOUNTER — Emergency Department (HOSPITAL_COMMUNITY)
Admission: EM | Admit: 2019-09-01 | Discharge: 2019-09-01 | Disposition: A | Discharge status: Home / Self Care | Payer: Medicaid Other | Attending: Emergency Medicine | Admitting: Emergency Medicine

## 2019-09-01 DIAGNOSIS — H5713 Ocular pain, bilateral: Secondary | ICD-10-CM | POA: Diagnosis not present

## 2019-09-01 DIAGNOSIS — Z5321 Procedure and treatment not carried out due to patient leaving prior to being seen by health care provider: Secondary | ICD-10-CM | POA: Insufficient documentation

## 2019-09-01 NOTE — ED Notes (Signed)
Pt informed that he has had back surgery in the past and is unable to sit in a chair for a long period time and has decided to go home. Pt encouraged to stay, but did was in too much pain.

## 2019-09-01 NOTE — ED Triage Notes (Signed)
Pt reports that he cant open his eyes since last night because it hurts.

## 2019-10-13 ENCOUNTER — Ambulatory Visit: Payer: Medicaid Other | Admitting: Physician Assistant

## 2019-11-03 ENCOUNTER — Encounter: Payer: Self-pay | Admitting: Physician Assistant

## 2019-11-03 ENCOUNTER — Telehealth: Payer: Self-pay

## 2019-11-03 ENCOUNTER — Ambulatory Visit: Payer: Medicaid Other | Attending: Physician Assistant | Admitting: Physician Assistant

## 2019-11-03 ENCOUNTER — Other Ambulatory Visit: Payer: Self-pay

## 2019-11-03 VITALS — BP 153/91 | HR 64 | Temp 97.5°F | Resp 17 | Wt 164.0 lb

## 2019-11-03 DIAGNOSIS — M1990 Unspecified osteoarthritis, unspecified site: Secondary | ICD-10-CM | POA: Diagnosis not present

## 2019-11-03 DIAGNOSIS — Z888 Allergy status to other drugs, medicaments and biological substances status: Secondary | ICD-10-CM | POA: Insufficient documentation

## 2019-11-03 DIAGNOSIS — G8929 Other chronic pain: Secondary | ICD-10-CM

## 2019-11-03 DIAGNOSIS — Z9119 Patient's noncompliance with other medical treatment and regimen: Secondary | ICD-10-CM | POA: Diagnosis not present

## 2019-11-03 DIAGNOSIS — M5441 Lumbago with sciatica, right side: Secondary | ICD-10-CM | POA: Diagnosis not present

## 2019-11-03 DIAGNOSIS — E1149 Type 2 diabetes mellitus with other diabetic neurological complication: Secondary | ICD-10-CM

## 2019-11-03 DIAGNOSIS — E1165 Type 2 diabetes mellitus with hyperglycemia: Secondary | ICD-10-CM | POA: Diagnosis not present

## 2019-11-03 DIAGNOSIS — Z7984 Long term (current) use of oral hypoglycemic drugs: Secondary | ICD-10-CM | POA: Insufficient documentation

## 2019-11-03 DIAGNOSIS — Z1211 Encounter for screening for malignant neoplasm of colon: Secondary | ICD-10-CM | POA: Diagnosis not present

## 2019-11-03 DIAGNOSIS — Z79899 Other long term (current) drug therapy: Secondary | ICD-10-CM | POA: Diagnosis not present

## 2019-11-03 DIAGNOSIS — Z7982 Long term (current) use of aspirin: Secondary | ICD-10-CM | POA: Insufficient documentation

## 2019-11-03 DIAGNOSIS — M5442 Lumbago with sciatica, left side: Secondary | ICD-10-CM

## 2019-11-03 DIAGNOSIS — I10 Essential (primary) hypertension: Secondary | ICD-10-CM

## 2019-11-03 DIAGNOSIS — M5126 Other intervertebral disc displacement, lumbar region: Secondary | ICD-10-CM

## 2019-11-03 DIAGNOSIS — E1140 Type 2 diabetes mellitus with diabetic neuropathy, unspecified: Secondary | ICD-10-CM | POA: Diagnosis not present

## 2019-11-03 DIAGNOSIS — E7800 Pure hypercholesterolemia, unspecified: Secondary | ICD-10-CM | POA: Insufficient documentation

## 2019-11-03 DIAGNOSIS — Z91199 Patient's noncompliance with other medical treatment and regimen due to unspecified reason: Secondary | ICD-10-CM

## 2019-11-03 LAB — POCT GLYCOSYLATED HEMOGLOBIN (HGB A1C): Hemoglobin A1C: 8.3 % — AB (ref 4.0–5.6)

## 2019-11-03 LAB — GLUCOSE, POCT (MANUAL RESULT ENTRY): POC Glucose: 349 mg/dl — AB (ref 70–99)

## 2019-11-03 MED ORDER — AMLODIPINE BESYLATE 10 MG PO TABS: 10.0000 mg | ORAL_TABLET | Freq: Every day | ORAL | 6 refills | Status: DC

## 2019-11-03 MED ORDER — EMPAGLIFLOZIN 10 MG PO TABS: 10.0000 mg | ORAL_TABLET | Freq: Every day | ORAL | 6 refills | Status: DC

## 2019-11-03 MED ORDER — FLUTICASONE PROPIONATE 50 MCG/ACT NA SUSP: 2.0000 | Freq: Every day | NASAL | 0 refills | Status: DC

## 2019-11-03 MED ORDER — GABAPENTIN 300 MG PO CAPS: 600.0000 mg | ORAL_CAPSULE | Freq: Every day | ORAL | 6 refills | Status: DC

## 2019-11-03 MED ORDER — GLIPIZIDE ER 10 MG PO TB24: 10.0000 mg | ORAL_TABLET | Freq: Every day | ORAL | 6 refills | Status: DC

## 2019-11-03 MED ORDER — DULOXETINE HCL 60 MG PO CPEP: 60.0000 mg | ORAL_CAPSULE | Freq: Every day | ORAL | 6 refills | Status: DC

## 2019-11-03 MED ORDER — ATORVASTATIN CALCIUM 40 MG PO TABS: 40.0000 mg | ORAL_TABLET | Freq: Every day | ORAL | 6 refills | Status: DC

## 2019-11-03 MED FILL — glipiZIDE XL 10 MG TB24: 10 | 30 days supply | Qty: 30 | Fill #0

## 2019-11-03 MED FILL — JARDIANCE 10 MG TABLET: 10 | 30 days supply | Qty: 30 | Fill #0

## 2019-11-03 MED FILL — GABAPENTIN 300 MG CAPSULE: 300 | 30 days supply | Qty: 60 | Fill #0

## 2019-11-03 MED FILL — DULoxetine HCL 60 MG CPEP: 60 | 30 days supply | Qty: 30 | Fill #0

## 2019-11-03 MED FILL — AMLODIPINE BESYLATE 10 MG T: 10 | 30 days supply | Qty: 30 | Fill #0

## 2019-11-03 MED FILL — ATORVASTATIN CALCIUM 40 MG: 40 | 30 days supply | Qty: 30 | Fill #0

## 2019-11-03 MED FILL — FLUTICASONE PROP 50 MCG SPR: 50 | 30 days supply | Qty: 16 | Fill #0

## 2019-11-03 NOTE — Telephone Encounter (Signed)
PA FOR JARDIANCE APPROVED THRU 11/02/20

## 2019-11-03 NOTE — Progress Notes (Signed)
Patient ID: David Wyatt, male   DOB: [birth year], 59 y.o.   MRN: [id number]   David Wyatt, is a 59 y.o. male  [id number]:[id number]  BJS:[id number]  DOB - 1960-07-29  Subjective:  Chief Complaint and HPI: David Wyatt is a 59 y.o. male here today to get med RF.  Out of everything for several months.  Just ate 2 honey buns and a soda.   No new concerns or issues.  Tells me his blood sugars have been fine at home but then tells me he isn't checking them.  ROS:   Constitutional:  No f/c, No night sweats, No unexplained weight loss. EENT:  No vision changes, No blurry vision, No hearing changes. No mouth, throat, or ear problems.  Respiratory: No cough, No SOB Cardiac: No CP, no palpitations GI:  No abd pain, No N/V/D. GU: No Urinary s/sx Musculoskeletal: back and R arm pain/chronic Neuro: No headache, no dizziness, no motor weakness.  Skin: No rash Endocrine:  No polydipsia. No polyuria.  Psych: Denies SI/HI  No problems updated.  ALLERGIES: Allergies  Allergen Reactions   Lisinopril Other (See Comments)    Lip Swelling, angioedema    PAST MEDICAL HISTORY: Past Medical History:  Diagnosis Date   Angioedema of lips 07/28/2012   left upper (07/29/2012)   Arthritis    hands and back   Chronic back pain    Cocaine abuse (HCC)    Diabetic neuropathy (HCC)    High cholesterol    Hypertension    Neuropathy    Type II diabetes mellitus ([place])     MEDICATIONS AT HOME: Prior to Admission medications   Medication Sig Start Date End Date Taking? Authorizing Provider  amLODipine (NORVASC) 10 MG tablet Take 1 tablet (10 mg total) by mouth daily. 11/03/19   Argentina Donovan, PA-C  aspirin EC 81 MG tablet Take 1 tablet (81 mg total) by mouth daily. Patient not taking: Reported on 11/03/2019 04/12/19   [name], MD  atorvastatin (LIPITOR) 40 MG tablet Take 1 tablet (40 mg total) by mouth daily. 11/03/19   Argentina Donovan, PA-C  Blood Glucose Monitoring Suppl (ACCU-CHEK  GUIDE ME) w/Device KIT 1 each by Does not apply route 3 (three) times daily before meals. Dx: Type 2 diabetes mellitus Patient not taking: Reported on 11/03/2019 02/16/19   [name], MD  Blood Glucose Monitoring Suppl (ACCU-CHEK GUIDE) w/Device KIT USE AS DIRECTED TO CHECK BLOOD SUGARS UP TO TWICE PER DAY Patient not taking: Reported on 11/03/2019 02/17/19   [name], MD  DULoxetine (CYMBALTA) 60 MG capsule Take 1 capsule (60 mg total) by mouth daily. 11/03/19   Argentina Donovan, PA-C  empagliflozin (JARDIANCE) 10 MG TABS tablet Take 1 tablet (10 mg total) by mouth daily before breakfast. 11/03/19   Argentina Donovan, PA-C  fluticasone (FLONASE) 50 MCG/ACT nasal spray Place 2 sprays into both nostrils daily. 11/03/19   Argentina Donovan, PA-C  gabapentin (NEURONTIN) 300 MG capsule Take 2 capsules (600 mg total) by mouth at bedtime. 11/03/19   Argentina Donovan, PA-C  glipiZIDE (GLUCOTROL XL) 10 MG 24 hr tablet Take 1 tablet (10 mg total) by mouth daily with breakfast. 11/03/19   Argentina Donovan, PA-C  glucose blood (ACCU-CHEK GUIDE) test strip Use as instructed 3 times daily.  Diagnosis-type 2 diabetes mellitus Patient not taking: Reported on 11/03/2019 02/16/19   [name], MD  glucose blood (TRUE METRIX BLOOD GLUCOSE TEST) test strip Use as instructed to check blood sugar  up to twice per day Patient not taking: Reported on 11/03/2019 07/24/18   Fulp, Cammie, MD  lidocaine (LIDODERM) 5 % Place 1 patch onto the skin daily. Remove & Discard patch within 12 hours or as directed by MD Patient not taking: Reported on 11/03/2019 06/05/17   [name], MD  loratadine (CLARITIN) 10 MG tablet Take 1 tablet (10 mg total) by mouth daily. Patient not taking: Reported on 11/03/2019 06/03/17   [name], MD  tiZANidine (ZANAFLEX) 4 MG tablet Take 1 tablet (4 mg total) by mouth at bedtime. As needed for muscle spasm Patient not taking: Reported on 11/03/2019 04/12/19   [name], MD    TRUEplus Lancets 28G MISC Use once or twice per day to check blood sugar Patient not taking: Reported on 11/03/2019 07/24/18   Fulp, Ander Gaster, MD     Objective:  EXAM:   Vitals:   11/03/19 0945  BP: (!) 153/91  Pulse: 64  Resp: 17  Temp: (!) 97.5 F (36.4 C)  TempSrc: Temporal  SpO2: 98%  Weight: 164 lb (74.4 kg)    General appearance : A&OX3. NAD. Non-toxic-appearing HEENT: Atraumatic and Normocephalic.  PERRLA. EOM intact. Chest/Lungs:  Breathing-non-labored, Good air entry bilaterally, breath sounds normal without rales, rhonchi, or wheezing  CVS: S1 S2 regular, no murmurs, gallops, rubs  Extremities: Bilateral Lower Ext shows no edema, both legs are warm to touch with = pulse throughout Neurology:  CN II-XII grossly intact, Non focal.   Psych:  TP linear. J/I WNL. Normal speech. Appropriate eye contact and affect.  Skin:  No Rash  Data Review Lab Results  Component Value Date   HGBA1C 8.3 (A) 11/03/2019   HGBA1C 11.9 (A) 02/16/2019   HGBA1C 8.1 (A) 11/17/2018     Assessment & Plan   1. Uncontrolled type 2 diabetes mellitus with hyperglycemia (HCC) Resume medications - Glucose (CBG) - POCT glycosylated hemoglobin (Hb A1C) - Microalbumin/Creatinine Ratio, Urine - Comprehensive metabolic panel - Lipid panel - atorvastatin (LIPITOR) 40 MG tablet; Take 1 tablet (40 mg total) by mouth daily.  Dispense: 30 tablet; Refill: 6 - empagliflozin (JARDIANCE) 10 MG TABS tablet; Take 1 tablet (10 mg total) by mouth daily before breakfast.  Dispense: 30 tablet; Refill: 6 - glipiZIDE (GLUCOTROL XL) 10 MG 24 hr tablet; Take 1 tablet (10 mg total) by mouth daily with breakfast.  Dispense: 30 tablet; Refill: 6  2. Chronic bilateral low back pain with bilateral sciatica RF cymbalta and gabapentin   3. Colon cancer screening - Ambulatory referral to Gastroenterology  4. Essential hypertension, benign Uncontrolled, out of meds - Comprehensive metabolic panel - CBC with  Differential/Platelet - amLODipine (NORVASC) 10 MG tablet; Take 1 tablet (10 mg total) by mouth daily.  Dispense: 30 tablet; Refill: 6  5. Lumbar disc herniation - gabapentin (NEURONTIN) 300 MG capsule; Take 2 capsules (600 mg total) by mouth at bedtime.  Dispense: 60 capsule; Refill: 6 - DULoxetine (CYMBALTA) 60 MG capsule; Take 1 capsule (60 mg total) by mouth daily.  Dispense: 30 capsule; Refill: 6  6. Other diabetic neurological complication associated with type 2 diabetes mellitus (HCC) - gabapentin (NEURONTIN) 300 MG capsule; Take 2 capsules (600 mg total) by mouth at bedtime.  Dispense: 60 capsule; Refill: 6  7. Non-compliance Discussed at length  Patient have been counseled extensively about nutrition and exercise  Return for 3 weeks with Lurena Joiner for Diabetes teaching and 3 months with PCP.  The patient was given clear instructions to go to ER or  return to medical center if symptoms don't improve, worsen or new problems develop. The patient verbalized understanding. The patient was told to call to get lab results if they haven't heard anything in the next week.     Freeman Caldron, PA-C [facility] and [facility] [place], [place]   11/03/2019, 10:08 AM

## 2019-11-03 NOTE — Patient Instructions (Signed)
Check blood sugars fasting and at bedtime and record and ring to next visit.

## 2019-11-04 LAB — CBC WITH DIFFERENTIAL/PLATELET
Basophils Absolute: 0.1 10*3/uL (ref 0.0–0.2)
Basos: 1 %
EOS (ABSOLUTE): 0.3 10*3/uL (ref 0.0–0.4)
Eos: 4 %
Hematocrit: 43.4 % (ref 37.5–51.0)
Hemoglobin: 14.3 g/dL (ref 13.0–17.7)
Immature Grans (Abs): 0 10*3/uL (ref 0.0–0.1)
Immature Granulocytes: 0 %
Lymphocytes Absolute: 1.9 10*3/uL (ref 0.7–3.1)
Lymphs: 28 %
MCH: 31 pg (ref 26.6–33.0)
MCHC: 32.9 g/dL (ref 31.5–35.7)
MCV: 94 fL (ref 79–97)
Monocytes Absolute: 0.4 10*3/uL (ref 0.1–0.9)
Monocytes: 7 %
Neutrophils Absolute: 4 10*3/uL (ref 1.4–7.0)
Neutrophils: 60 %
Platelets: 295 10*3/uL (ref 150–450)
RBC: 4.62 x10E6/uL (ref 4.14–5.80)
RDW: 12.4 % (ref 11.6–15.4)
WBC: 6.6 10*3/uL (ref 3.4–10.8)

## 2019-11-04 LAB — COMPREHENSIVE METABOLIC PANEL
ALT: 10 IU/L (ref 0–44)
AST: 17 IU/L (ref 0–40)
Albumin/Globulin Ratio: 1.5 (ref 1.2–2.2)
Albumin: 4.1 g/dL (ref 3.8–4.9)
Alkaline Phosphatase: 110 IU/L (ref 48–121)
BUN/Creatinine Ratio: 9 — ABNORMAL LOW (ref 10–24)
BUN: 9 mg/dL (ref 8–27)
Bilirubin Total: 0.6 mg/dL (ref 0.0–1.2)
CO2: 22 mmol/L (ref 20–29)
Calcium: 9.3 mg/dL (ref 8.6–10.2)
Chloride: 102 mmol/L (ref 96–106)
Creatinine, Ser: 1.03 mg/dL (ref 0.76–1.27)
GFR calc Af Amer: 91 mL/min/{1.73_m2} (ref >59)
GFR calc non Af Amer: 79 mL/min/{1.73_m2} (ref >59)
Globulin, Total: 2.7 g/dL (ref 1.5–4.5)
Glucose: 253 mg/dL — ABNORMAL HIGH (ref 65–99)
Potassium: 4.3 mmol/L (ref 3.5–5.2)
Sodium: 142 mmol/L (ref 134–144)
Total Protein: 6.8 g/dL (ref 6.0–8.5)

## 2019-11-04 LAB — LIPID PANEL
Chol/HDL Ratio: 3.2 ratio (ref 0.0–5.0)
Cholesterol, Total: 194 mg/dL (ref 100–199)
HDL: 60 mg/dL (ref >39)
LDL Chol Calc (NIH): 99 mg/dL (ref 0–99)
Triglycerides: 204 mg/dL — ABNORMAL HIGH (ref 0–149)
VLDL Cholesterol Cal: 35 mg/dL (ref 5–40)

## 2019-11-04 LAB — MICROALBUMIN / CREATININE URINE RATIO
Creatinine, Urine: 161.6 mg/dL
Microalb/Creat Ratio: 6 mg/g creat (ref 0–29)
Microalbumin, Urine: 10.3 ug/mL

## 2020-01-04 ENCOUNTER — Ambulatory Visit: Payer: Medicaid Other | Admitting: Family Medicine

## 2020-01-04 MED FILL — ATORVASTATIN CALCIUM 40 MG: 40 | 90 days supply | Qty: 90 | Fill #0

## 2020-01-04 MED FILL — GABAPENTIN 300 MG CAPSULE: 300 | 30 days supply | Qty: 60 | Fill #0

## 2020-01-04 MED FILL — glipiZIDE XL 10 MG TB24: 10 | 90 days supply | Qty: 90 | Fill #0

## 2020-01-04 MED FILL — AMLODIPINE BESYLATE 10 MG T: 10 | 90 days supply | Qty: 90 | Fill #0

## 2020-01-04 MED FILL — JARDIANCE 10 MG TABLET: 10 | 90 days supply | Qty: 90 | Fill #0

## 2020-02-14 ENCOUNTER — Ambulatory Visit: Payer: Medicaid Other | Admitting: Family Medicine

## 2020-07-20 ENCOUNTER — Ambulatory Visit (HOSPITAL_COMMUNITY)
Admission: EM | Admit: 2020-07-20 | Discharge: 2020-07-20 | Disposition: A | Discharge status: Home / Self Care | Payer: Medicaid Other | Attending: Student | Admitting: Student

## 2020-07-20 ENCOUNTER — Encounter (HOSPITAL_COMMUNITY): Payer: Self-pay

## 2020-07-20 DIAGNOSIS — Z7984 Long term (current) use of oral hypoglycemic drugs: Secondary | ICD-10-CM

## 2020-07-20 DIAGNOSIS — E1142 Type 2 diabetes mellitus with diabetic polyneuropathy: Secondary | ICD-10-CM | POA: Diagnosis not present

## 2020-07-20 DIAGNOSIS — S39012A Strain of muscle, fascia and tendon of lower back, initial encounter: Secondary | ICD-10-CM

## 2020-07-20 DIAGNOSIS — Z9889 Other specified postprocedural states: Secondary | ICD-10-CM | POA: Diagnosis not present

## 2020-07-20 DIAGNOSIS — E118 Type 2 diabetes mellitus with unspecified complications: Secondary | ICD-10-CM | POA: Diagnosis not present

## 2020-07-20 DIAGNOSIS — E119 Type 2 diabetes mellitus without complications: Secondary | ICD-10-CM

## 2020-07-20 MED ORDER — TIZANIDINE HCL 2 MG PO TABS
2.0000 mg | ORAL_TABLET | Freq: Three times a day (TID) | ORAL | 0 refills | Status: DC
  Filled 2020-07-20 – 2020-11-01 (×2): qty 21, 7d supply, fill #0

## 2020-07-20 NOTE — ED Provider Notes (Signed)
[place]    CSN: [id number] Arrival date & time: 07/20/20  1704      History   Chief Complaint Chief Complaint  Patient presents with  . Back Pain    HPI David Wyatt is a 60 y.o. male presenting for lower back pain for about 1 month.    Medical history DDD, 2020 Lumbar disc herniation with subsequent L3-L5 fusion, chronic back pain, back arthritis, diabetic neuropathy, poorly controlled type 2 diabetes.  This patient notes bilateral lumbar paraspinous muscle tenderness for about 1 month, unchanged.  Pain radiating down the back of both legs right worse than left, also unchanged.  Decrsed sensation in hands and feet, which is his baseline given diabetic neuropathy.  Denies absolutely any new trauma, overuse.  States these are his typical symptoms when his back acts up.  He has not taken any medications for his symptoms.  States he has not been monitoring his sugars at home, but last he checked they were "high".  Denies urinary symptoms including retention, dysuria, frequency. denies numbness in arms/legs, denies weakness in arms/legs, denies saddle anesthesia, denies bowel/bladder incontinence.      HPI  Past Medical History:  Diagnosis Date  . Angioedema of lips 07/28/2012   left upper (07/29/2012)  . Arthritis    hands and back  . Chronic back pain   . Cocaine abuse ([place])   . Diabetic neuropathy ([place])   . High cholesterol   . Hypertension   . Neuropathy   . Type II diabetes mellitus [facility])     Patient Active Problem List   Diagnosis Date Noted  . Tobacco abuse 08/19/2018  . Hyperlipidemia 04/24/2017  . GERD (gastroesophageal reflux disease) 06/13/2015  . Controlled type 2 diabetes mellitus with complication, without long-term current use of insulin ([place]) 05/01/2015  . Diabetic neuropathy ([place]) 05/01/2015  . Essential hypertension, benign 11/01/2013  . Lumbar disc herniation 09/13/2013  . Unspecified constipation 09/13/2013  . Diabetes mellitus due to  underlying condition without complications ([place]) 65/46/5035  . Chronic back pain 09/13/2013  . Viral gastroenteritis 05/05/2013  . [place] of finger of left hand with lymphangitis 12/13/2012  . Hyperglycemia 07/29/2012  . Angioedema of lips 07/29/2012  . Crack cocaine use 07/29/2012  . Arthritis 07/29/2012  . Weakness generalized 07/29/2012    Past Surgical History:  Procedure Laterality Date  . BACK SURGERY    . HERNIA REPAIR Right 04/01/2012  . I & D EXTREMITY Left 12/13/2012   Procedure: IRRIGATION AND DEBRIDEMENT LEFT THUMB;  Surgeon: Tennis Must, MD;  Location: [place];  Service: Orthopedics;  Laterality: Left;  . INCISION AND DRAINAGE OF WOUND     boil on back/notes 07/14/2008 (07/29/2012)  . INGUINAL HERNIA REPAIR  04/01/2012   Procedure: HERNIA REPAIR INGUINAL ADULT;  Surgeon: Haywood Lasso, MD;  Location: [place];  Service: General;  Laterality: Right;  . INGUINAL HERNIA REPAIR Left 09/06/2016   Procedure: OPEN REPAIR LEFT INGUINAL HERNIA;  Surgeon: Greer Pickerel, MD;  Location: [place];  Service: General;  Laterality: Left;  . INSERTION OF MESH Left 09/06/2016   Procedure: INSERTION OF MESH;  Surgeon: Greer Pickerel, MD;  Location: [place];  Service: General;  Laterality: Left;  . TONSILLECTOMY         Home Medications    Prior to Admission medications   Medication Sig Start Date End Date Taking? Authorizing Provider  tizanidine (ZANAFLEX) 2 MG capsule Take 1 capsule (2 mg total) by mouth 3 (three) times daily.  07/20/20  Yes Hazel Sams, PA-C  amLODipine (NORVASC) 10 MG tablet Take 1 tablet (10 mg total) by mouth daily. 11/03/19   Argentina Donovan, PA-C  atorvastatin (LIPITOR) 40 MG tablet Take 1 tablet (40 mg total) by mouth daily. 11/03/19   Argentina Donovan, PA-C  Blood Glucose Monitoring Suppl (ACCU-CHEK GUIDE) w/Device KIT USE AS DIRECTED TO CHECK BLOOD SUGARS UP TO TWICE PER DAY Patient not taking: No sig reported 02/17/19   [name], MD  DULoxetine (CYMBALTA) 60 MG  capsule Take 1 capsule (60 mg total) by mouth daily. 11/03/19   Argentina Donovan, PA-C  empagliflozin (JARDIANCE) 10 MG TABS tablet Take 1 tablet (10 mg total) by mouth daily before breakfast. 11/03/19   Argentina Donovan, PA-C  fluticasone (FLONASE) 50 MCG/ACT nasal spray Place 2 sprays into both nostrils daily. 11/03/19   Argentina Donovan, PA-C  gabapentin (NEURONTIN) 300 MG capsule Take 2 capsules (600 mg total) by mouth at bedtime. 11/03/19   Argentina Donovan, PA-C  glipiZIDE (GLUCOTROL XL) 10 MG 24 hr tablet Take 1 tablet (10 mg total) by mouth daily with breakfast. 11/03/19   Argentina Donovan, PA-C  loratadine (CLARITIN) 10 MG tablet Take 1 tablet (10 mg total) by mouth daily. Patient not taking: No sig reported 06/03/17 07/20/20  [name], MD    Family History Family History  Problem Relation Age of Onset  . Kidney disease Mother   . Diabetes Father     Social History Social History   Tobacco Use  . Smoking status: Current Every Day Smoker    Packs/day: 0.25    Years: 30.00    Pack years: 7.50    Types: Cigarettes  . Smokeless tobacco: Former Systems developer    Quit date: 08/15/2015  . Tobacco comment: 04/29/16 2  cigs daily, 10/27/17 sometimes < .25 PPD  Vaping Use  . Vaping Use: Never used  Substance Use Topics  . Alcohol use: No    Alcohol/week: 0.0 standard drinks  . Drug use: Yes    Types: "Crack" cocaine    Comment: last use 08/23/16     Allergies   Lisinopril   Review of Systems Review of Systems  Constitutional: Negative for chills, fever and unexpected weight change.  Respiratory: Negative for chest tightness and shortness of breath.   Cardiovascular: Negative for chest pain and palpitations.  Gastrointestinal: Negative for abdominal pain, diarrhea, nausea and vomiting.  Genitourinary: Negative for decreased urine volume, difficulty urinating and frequency.  Musculoskeletal: Positive for back pain. Negative for arthralgias, gait problem, joint swelling,  myalgias, neck pain and neck stiffness.  Skin: Negative for wound.  Neurological: Negative for dizziness, tremors, seizures, syncope, facial asymmetry, speech difficulty, weakness, light-headedness, numbness and headaches.  All other systems reviewed and are negative.    Physical Exam Triage Vital Signs ED Triage Vitals  Enc Vitals Group     BP 07/20/20 1835 (!) 165/94     Pulse Rate 07/20/20 1835 84     Resp 07/20/20 1835 20     Temp 07/20/20 1835 99.1 F (37.3 C)     Temp Source 07/20/20 1835 Oral     SpO2 07/20/20 1835 97 %     Weight --      Height --      Head Circumference --      Peak Flow --      Pain Score 07/20/20 1829 10     Pain Loc --  Pain Edu? --      Excl. in [place]? --    No data found.  Updated Vital Signs BP (!) 165/94 (BP Location: Right Arm)   Pulse 84   Temp 99.1 F (37.3 C) (Oral)   Resp 20   SpO2 97%   Visual Acuity Right Eye Distance:   Left Eye Distance:   Bilateral Distance:    Right Eye Near:   Left Eye Near:    Bilateral Near:     Physical Exam Vitals reviewed.  Constitutional:      General: He is not in acute distress.    Appearance: Normal appearance. He is not ill-appearing.  HENT:     Head: Normocephalic and atraumatic.  Eyes:     Extraocular Movements: Extraocular movements intact.     Pupils: Pupils are equal, round, and reactive to light.  Cardiovascular:     Rate and Rhythm: Normal rate and regular rhythm.     Heart sounds: Normal heart sounds.  Pulmonary:     Effort: Pulmonary effort is normal.     Breath sounds: Normal breath sounds and air entry.  Abdominal:     Tenderness: There is no abdominal tenderness. There is no right CVA tenderness, left CVA tenderness, guarding or rebound.  Musculoskeletal:     Cervical back: Normal range of motion. No swelling, deformity, signs of trauma, rigidity, spasms, tenderness, bony tenderness or crepitus. No pain with movement.     Thoracic back: No swelling, deformity, signs  of trauma, spasms, tenderness or bony tenderness. Normal range of motion. No scoliosis.     Lumbar back: Spasms and tenderness present. No swelling, deformity, signs of trauma or bony tenderness. Normal range of motion. Negative right straight leg raise test and negative left straight leg raise test. No scoliosis.     Comments: Bilateral lumbar paraspinous muscle tenderness to palpation.  Well-healed surgical scar over lumbar spine.  No spinous tenderness, deformity, step-off.  No cervical or thoracic paraspinous muscle tenderness.  Strength 5 out of 5 in upper and lower extremities.  Gait intact but patient leaning forward due to discomfort.  Positive straight leg raise bilaterally.  Sensation intact.  Absolutely no other injury, deformity, tenderness, ecchymosis, abrasion.  Skin:    Capillary Refill: Capillary refill takes less than 2 seconds.  Neurological:     General: No focal deficit present.     Mental Status: He is alert.     Cranial Nerves: No cranial nerve deficit.  Psychiatric:        Mood and Affect: Mood normal.        Behavior: Behavior normal.        Thought Content: Thought content normal.        Judgment: Judgment normal.      UC Treatments / Results  Labs (all labs ordered are listed, but only abnormal results are displayed) Labs Reviewed - No data to display  EKG   Radiology No results found.  Procedures Procedures (including critical care time)  Medications Ordered in UC Medications - No data to display  Initial Impression / Assessment and Plan / UC Course  I have reviewed the triage vital signs and the nursing notes.  Pertinent labs & imaging results that were available during my care of the patient were reviewed by me and considered in my medical decision making (see chart for details).     This patient is a 60 year old male presenting with acute exacerbation of chronic back pain.  He did have a  lumbar fusion in 2020 following disc herniation.  Today  he was without red flag symptoms or new trauma.  Patient is a diabetic, so we will avoid treatment with prednisone at this time in favor of muscle relaxer and Tylenol/ibuprofen.  Heat, range of motion exercises.  Recommended follow-up with his spine specialist, but he states this is too far of a drive so I provided him information of a local spine specialist as well.  ED return precautions discussed.  Final Clinical Impressions(s) / UC Diagnoses   Final diagnoses:  Strain of lumbar region, initial encounter  History of spinal surgery  Diabetic polyneuropathy associated with type 2 diabetes mellitus ([place])  Type 2 diabetes mellitus with diabetic polyneuropathy, without long-term current use of insulin (HCC)  Diabetes mellitus treated with oral medication ([place])     Discharge Instructions     -Start the muscle relaxer-Zanaflex (tizanidine), up to 3 times daily for muscle spasms and pain.  This can make you drowsy, so take at bedtime or when you do not need to drive or operate machinery. -Take Tylenol 1000 mg 3 times daily, and ibuprofen 800 mg 3 times daily with food.  You can take these together, or alternate every 3-4 hours. -Rest, heading pad, gentle range of motion exercises -Follow-up with spine specialist if symptoms persist, information below or you can follow-up with the doctor that did your surgery. -If you develop new symptoms like new weakness or numbness in your legs, urinary retention, constipation, the worst pain of your life that she had straight to the emergency department.    ED Prescriptions    Medication Sig Dispense Auth. Provider   tizanidine (ZANAFLEX) 2 MG capsule Take 1 capsule (2 mg total) by mouth 3 (three) times daily. 21 capsule Hazel Sams, PA-C     I have reviewed the PDMP during this encounter.   Hazel Sams, PA-C 07/20/20 1949

## 2020-07-20 NOTE — Discharge Instructions (Signed)
-  Start the muscle relaxer-Zanaflex (tizanidine), up to 3 times daily for muscle spasms and pain.  This can make you drowsy, so take at bedtime or when you do not need to drive or operate machinery. -Take Tylenol 1000 mg 3 times daily, and ibuprofen 800 mg 3 times daily with food.  You can take these together, or alternate every 3-4 hours. -Rest, heading pad, gentle range of motion exercises -Follow-up with spine specialist if symptoms persist, information below or you can follow-up with the doctor that did your surgery. -If you develop new symptoms like new weakness or numbness in your legs, urinary retention, constipation, the worst pain of your life that she had straight to the emergency department.

## 2020-07-20 NOTE — ED Triage Notes (Signed)
Pt reports lower back pain, bilateral back pain x 1 month. Pain is worse when pt try to stand straight or turn to the side. Pt has not taken any OTC meds for complaints.

## 2020-07-21 ENCOUNTER — Other Ambulatory Visit: Payer: Self-pay

## 2020-07-28 ENCOUNTER — Other Ambulatory Visit: Payer: Self-pay

## 2020-08-30 ENCOUNTER — Ambulatory Visit: Payer: Medicaid Other | Admitting: Family Medicine

## 2020-10-24 ENCOUNTER — Emergency Department (HOSPITAL_BASED_OUTPATIENT_CLINIC_OR_DEPARTMENT_OTHER)
Admission: EM | Admit: 2020-10-24 | Discharge: 2020-10-24 | Disposition: A | Discharge status: Home / Self Care | Payer: Medicaid Other | Attending: Emergency Medicine | Admitting: Emergency Medicine

## 2020-10-24 ENCOUNTER — Encounter (HOSPITAL_BASED_OUTPATIENT_CLINIC_OR_DEPARTMENT_OTHER): Payer: Self-pay

## 2020-10-24 DIAGNOSIS — F17210 Nicotine dependence, cigarettes, uncomplicated: Secondary | ICD-10-CM | POA: Diagnosis not present

## 2020-10-24 DIAGNOSIS — Z79899 Other long term (current) drug therapy: Secondary | ICD-10-CM | POA: Insufficient documentation

## 2020-10-24 DIAGNOSIS — E1140 Type 2 diabetes mellitus with diabetic neuropathy, unspecified: Secondary | ICD-10-CM | POA: Diagnosis not present

## 2020-10-24 DIAGNOSIS — M545 Low back pain, unspecified: Secondary | ICD-10-CM | POA: Diagnosis present

## 2020-10-24 DIAGNOSIS — E1165 Type 2 diabetes mellitus with hyperglycemia: Secondary | ICD-10-CM | POA: Insufficient documentation

## 2020-10-24 DIAGNOSIS — I10 Essential (primary) hypertension: Secondary | ICD-10-CM | POA: Diagnosis not present

## 2020-10-24 DIAGNOSIS — G8929 Other chronic pain: Secondary | ICD-10-CM

## 2020-10-24 DIAGNOSIS — Z7984 Long term (current) use of oral hypoglycemic drugs: Secondary | ICD-10-CM | POA: Diagnosis not present

## 2020-10-24 DIAGNOSIS — M5442 Lumbago with sciatica, left side: Secondary | ICD-10-CM | POA: Insufficient documentation

## 2020-10-24 DIAGNOSIS — R739 Hyperglycemia, unspecified: Secondary | ICD-10-CM

## 2020-10-24 DIAGNOSIS — M5441 Lumbago with sciatica, right side: Secondary | ICD-10-CM | POA: Diagnosis not present

## 2020-10-24 LAB — CBC WITH DIFFERENTIAL/PLATELET
Abs Immature Granulocytes: 0.04 10*3/uL (ref 0.00–0.07)
Basophils Absolute: 0.1 10*3/uL (ref 0.0–0.1)
Basophils Relative: 1 %
Eosinophils Absolute: 0.3 10*3/uL (ref 0.0–0.5)
Eosinophils Relative: 4 %
HCT: 39.6 % (ref 39.0–52.0)
Hemoglobin: 13.2 g/dL (ref 13.0–17.0)
Immature Granulocytes: 1 %
Lymphocytes Relative: 21 %
Lymphs Abs: 1.7 10*3/uL (ref 0.7–4.0)
MCH: 30.8 pg (ref 26.0–34.0)
MCHC: 33.3 g/dL (ref 30.0–36.0)
MCV: 92.5 fL (ref 80.0–100.0)
Monocytes Absolute: 0.4 10*3/uL (ref 0.1–1.0)
Monocytes Relative: 6 %
Neutro Abs: 5.5 10*3/uL (ref 1.7–7.7)
Neutrophils Relative %: 67 %
Platelets: 294 10*3/uL (ref 150–400)
RBC: 4.28 MIL/uL (ref 4.22–5.81)
RDW: 12.8 % (ref 11.5–15.5)
WBC: 8 10*3/uL (ref 4.0–10.5)
nRBC: 0 % (ref 0.0–0.2)

## 2020-10-24 LAB — CBG MONITORING, ED: Glucose-Capillary: 397 mg/dL — ABNORMAL HIGH (ref 70–99)

## 2020-10-24 LAB — URINALYSIS, ROUTINE W REFLEX MICROSCOPIC
Bilirubin Urine: NEGATIVE
Glucose, UA: >1000 mg/dL — AB
Hgb urine dipstick: NEGATIVE
Ketones, ur: NEGATIVE mg/dL
Leukocytes,Ua: NEGATIVE
Nitrite: NEGATIVE
Protein, ur: NEGATIVE mg/dL
Specific Gravity, Urine: 1.031 — ABNORMAL HIGH (ref 1.005–1.030)
pH: 6 (ref 5.0–8.0)

## 2020-10-24 LAB — BASIC METABOLIC PANEL
Anion gap: 9 (ref 5–15)
BUN: 11 mg/dL (ref 8–23)
CO2: 26 mmol/L (ref 22–32)
Calcium: 9 mg/dL (ref 8.9–10.3)
Chloride: 100 mmol/L (ref 98–111)
Creatinine, Ser: 0.89 mg/dL (ref 0.61–1.24)
GFR, Estimated: >60 mL/min (ref >60)
Glucose, Bld: 440 mg/dL — ABNORMAL HIGH (ref 70–99)
Potassium: 4.4 mmol/L (ref 3.5–5.1)
Sodium: 135 mmol/L (ref 135–145)

## 2020-10-24 MED ORDER — CYCLOBENZAPRINE HCL 5 MG PO TABS
5.0000 mg | ORAL_TABLET | Freq: Once | ORAL | Status: AC
  Administered 2020-10-24: 5 mg via ORAL
  Filled 2020-10-24: qty 1

## 2020-10-24 MED ORDER — CYCLOBENZAPRINE HCL 10 MG PO TABS: 10.0000 mg | ORAL_TABLET | Freq: Two times a day (BID) | ORAL | 0 refills | Status: DC | PRN

## 2020-10-24 MED ORDER — FENTANYL CITRATE (PF) 100 MCG/2ML IJ SOLN
50.0000 ug | Freq: Once | INTRAMUSCULAR | Status: AC
Start: 2020-10-24 — End: 2020-10-24
  Administered 2020-10-24: 50 ug via INTRAVENOUS
  Filled 2020-10-24: qty 2

## 2020-10-24 NOTE — ED Notes (Signed)
Bladder scan performed with 45 mL post void residual

## 2020-10-24 NOTE — ED Notes (Signed)
CBG 397 mg/dL reported to [employer] RN

## 2020-10-24 NOTE — ED Triage Notes (Addendum)
Per EMS, Pt, from [facility], c/o chronic back pain and urinary urgency/incontenience x "several months."  Pain score 10/10 w/ movement.  Pt reports pain shoots down BLE w/ pain.  Sts he can't stand straight.  Hx of back surgery.    Reports PCP appointment in the next few weeks.

## 2020-10-24 NOTE — ED Provider Notes (Signed)
[facility]ERG[facility] DEPT Provider Note   CSN: [id number] Arrival date & time: 10/24/20  0800     History Chief Complaint  Patient presents with   Back Pain    David Wyatt is a 60 y.o. male.  Presents ER with concern for back pain.  Patient reports that he has suffered from low back pain for a very long time.  A few years ago had a spine surgery performed in [place].  Does not recall details or exact timing.  States that his pain comes and goes, seems to be worse over the last few weeks to couple months.  States that pain normally radiates down left side but now is shooting down both sides.  Is able to walk but has difficulty standing up straight and walking, states that this induces back spasm.  Pain is currently minimal at rest but is up to 10 out of 10 with movements.  No numbness or weakness in his arms or legs.  Denies urinary incontinence, says that he may have bowel incontinence, has a hard time holding it for very long time.  No urinary retention.  No saddle anesthesia.  Past medical history of cocaine, hyperlipidemia, hypertension, type 2 diabetes, tobacco abuse, chronic back pain, lumbar disc herniation    HPI     Past Medical History:  Diagnosis Date   Angioedema of lips 07/28/2012   left upper (07/29/2012)   Arthritis    hands and back   Chronic back pain    Cocaine abuse ([place])    Diabetic neuropathy (H[facility])    High cholesterol    Hypertension    Neuropathy    Type II diabetes mellitus ([place])     Patient Active Problem List   Diagnosis Date Noted   Tobacco abuse 08/19/2018   Hyperlipidemia 04/24/2017   GERD (gastroesophageal reflux disease) 06/13/2015   Controlled type 2 diabetes mellitus with complication, without long-term current use of insulin ([place]) 05/01/2015   Diabetic neuropathy ([place]) 05/01/2015   Essential hypertension, benign 11/01/2013   Lumbar disc herniation 09/13/2013   Unspecified constipation 09/13/2013   Diabetes mellitus due to  underlying condition without complications ([place]) 40/98/1191   Chronic back pain 09/13/2013   Viral gastroenteritis 05/05/2013   Felon of finger of left hand with lymphangitis 12/13/2012   Hyperglycemia 07/29/2012   Angioedema of lips 07/29/2012   Crack cocaine use 07/29/2012   Arthritis 07/29/2012   Weakness generalized 07/29/2012    Past Surgical History:  Procedure Laterality Date   BACK SURGERY     HERNIA REPAIR Right 04/01/2012   I & D EXTREMITY Left 12/13/2012   Procedure: IRRIGATION AND DEBRIDEMENT LEFT THUMB;  Surgeon: Tennis Must, MD;  Location: [facility];  Service: Orthopedics;  Laterality: Left;   INCISION AND DRAINAGE OF WOUND     boil on back/notes 07/14/2008 (07/29/2012)   INGUINAL HERNIA REPAIR  04/01/2012   Procedure: HERNIA REPAIR INGUINAL ADULT;  Surgeon: Haywood Lasso, MD;  Location: [facility];  Service: General;  Laterality: Right;   INGUINAL HERNIA REPAIR Left 09/06/2016   Procedure: OPEN REPAIR LEFT INGUINAL HERNIA;  Surgeon: Greer Pickerel, MD;  Location: [name];  Service: General;  Laterality: Left;   INSERTION OF MESH Left 09/06/2016   Procedure: INSERTION OF MESH;  Surgeon: Greer Pickerel, MD;  Location[facility] OR;  Service: General;  Laterality: Left;   TONSILLECTOMY         Family History  Problem Relation Age of Onset   Kidney disease Mother  Diabetes Father     Social History   Tobacco Use   Smoking status: Every Day    Packs/day: 0.25    Years: 30.00    Pack years: 7.50    Types: Cigarettes   Smokeless tobacco: Former    Quit date: 08/15/2015   Tobacco comments:    04/29/16 2  cigs daily, 10/27/17 sometimes < .25 PPD  Vaping Use   Vaping Use: Never used  Substance Use Topics   Alcohol use: No    Alcohol/week: 0.0 standard drinks   Drug use: Yes    Types: "Crack" cocaine    Home Medications Prior to Admission medications   Medication Sig Start Date End Date Taking? Authorizing Provider  cyclobenzaprine (FLEXERIL) 10 MG tablet Take 1 tablet (10 mg  total) by mouth 2 (two) times daily as needed for muscle spasms. 10/24/20  Yes Lucrezia Starch, MD  amLODipine (NORVASC) 10 MG tablet Take 1 tablet (10 mg total) by mouth daily. 11/03/19   Argentina Donovan, PA-C  atorvastatin (LIPITOR) 40 MG tablet Take 1 tablet (40 mg total) by mouth daily. 11/03/19   Argentina Donovan, PA-C  Blood Glucose Monitoring Suppl (ACCU-CHEK GUIDE) w/Device KIT USE AS DIRECTED TO CHECK BLOOD SUGARS UP TO TWICE PER DAY Patient not taking: No sig reported 02/17/19   [name], MD  DULoxetine (CYMBALTA) 60 MG capsule Take 1 capsule (60 mg total) by mouth daily. 11/03/19   Argentina Donovan, PA-C  empagliflozin (JARDIANCE) 10 MG TABS tablet Take 1 tablet (10 mg total) by mouth daily before breakfast. 11/03/19   Argentina Donovan, PA-C  fluticasone (FLONASE) 50 MCG/ACT nasal spray Place 2 sprays into both nostrils daily. 11/03/19   Argentina Donovan, PA-C  gabapentin (NEURONTIN) 300 MG capsule Take 2 capsules (600 mg total) by mouth at bedtime. 11/03/19   Argentina Donovan, PA-C  glipiZIDE (GLUCOTROL XL) 10 MG 24 hr tablet Take 1 tablet (10 mg total) by mouth daily with breakfast. 11/03/19   Argentina Donovan, PA-C  tiZANidine (ZANAFLEX) 2 MG tablet Take 1 tablet (2 mg total) by mouth 3 (three) times daily. 07/20/20   Hazel Sams, PA-C  loratadine (CLARITIN) 10 MG tablet Take 1 tablet (10 mg total) by mouth daily. Patient not taking: No sig reported 06/03/17 07/20/20  [name], MD    Allergies    Lisinopril  Review of Systems   Review of Systems  Constitutional:  Negative for chills and fever.  HENT:  Negative for ear pain and sore throat.   Eyes:  Negative for pain and visual disturbance.  Respiratory:  Negative for cough and shortness of breath.   Cardiovascular:  Negative for chest pain and palpitations.  Gastrointestinal:  Negative for abdominal pain and vomiting.  Genitourinary:  Negative for dysuria and hematuria.  Musculoskeletal:  Positive for back  pain. Negative for arthralgias.  Skin:  Negative for color change and rash.  Neurological:  Negative for seizures and syncope.  All other systems reviewed and are negative.  Physical Exam Updated Vital Signs BP (!) 157/97 (BP Location: Right Arm)   Pulse 67   Temp 98.4 F (36.9 C) (Oral)   Resp 17   Ht 5' 8" (1.727 m)   Wt 74.8 kg   SpO2 97%   BMI 25.09 kg/m   Physical Exam Vitals and nursing note reviewed.  Constitutional:      Appearance: He is well-developed.  HENT:     Head: Normocephalic and atraumatic.  Eyes:     Conjunctiva/sclera: Conjunctivae normal.  Cardiovascular:     Rate and Rhythm: Normal rate and regular rhythm.     Heart sounds: No murmur heard. Pulmonary:     Effort: Pulmonary effort is normal. No respiratory distress.     Breath sounds: Normal breath sounds.  Abdominal:     Palpations: Abdomen is soft.     Tenderness: There is no abdominal tenderness.  Genitourinary:    Comments: Allen RN chaperone  Normal-appearing rectum, normal rectal tone, brown stool Musculoskeletal:     Cervical back: Neck supple.  Skin:    General: Skin is warm and dry.  Neurological:     General: No focal deficit present.     Mental Status: He is alert.     Comments: 5 and 5 strength in bilateral lower extremities, sensation to light touch intact in lower extremities    ED Results / Procedures / Treatments   Labs (all labs ordered are listed, but only abnormal results are displayed) Labs Reviewed  BASIC METABOLIC PANEL - Abnormal; Notable for the following components:      Result Value   Glucose, Bld 440 (*)    All other components within normal limits  URINALYSIS, ROUTINE W REFLEX MICROSCOPIC - Abnormal; Notable for the following components:   Specific Gravity, Urine 1.031 (*)    Glucose, UA >1,000 (*)    All other components within normal limits  CBG MONITORING, ED - Abnormal; Notable for the following components:   Glucose-Capillary 397 (*)    All other  components within normal limits  CBC WITH DIFFERENTIAL/PLATELET    EKG None  Radiology No results found.  Procedures Procedures   Medications Ordered in ED Medications  fentaNYL (SUBLIMAZE) injection 50 mcg (50 mcg Intravenous Given 10/24/20 0831)  cyclobenzaprine (FLEXERIL) tablet 5 mg (5 mg Oral Given 10/24/20 0830)    ED Course  I have reviewed the triage vital signs and the nursing notes.  Pertinent labs & imaging results that were available during my care of the patient were reviewed by me and considered in my medical decision making (see chart for details).    MDM Rules/Calculators/A&P                           60 year old gentleman with history of chronic low back pain, past history of lumbar disc herniation, past spine surgery presenting to ER with worsening of his chronic back pain.  On exam he appears well in no distress.  Normal strength and sensation throughout lower extremities.  He endorsed bowel incontinence however has a completely normal rectal tone.  Check postvoid residual which was normal.  Current exam/presentation not consistent with cauda equina.  EMS also reported concern for elevated blood sugars.  His sugars are elevated in the 300s.  Remainder of electrolytes are stable.  For his hyperglycemia, recommend patient follow-up with primary care for better long-term management.  For his back pain, recommend patient follow-up with neurosurgery.  Given his poorly controlled diabetes, will defer steroid burst.  Provided Rx for muscle relaxers, pain well controlled in ER, discharged home.    After the discussed management above, the patient was determined to be safe for discharge.  The patient was in agreement with this plan and all questions regarding their care were answered.  ED return precautions were discussed and the patient will return to the ED with any significant worsening of condition.   Final Clinical Impression(s) / ED  Diagnoses Final diagnoses:   Hyperglycemia  Chronic bilateral low back pain with bilateral sciatica    Rx / DC Orders ED Discharge Orders          Ordered    cyclobenzaprine (FLEXERIL) 10 MG tablet  2 times daily PRN        10/24/20 0930             Lucrezia Starch, MD 10/24/20 [phone]

## 2020-10-24 NOTE — Discharge Instructions (Addendum)
Please follow-up with your primary care doctor and with the spine specialist.  Take the muscle relaxer as needed.  If pain becomes unbearable, you develop bladder or bowel incontinence, urinary retention, fever or other new concerning symptom, come back to ER for reassessment.

## 2020-10-25 ENCOUNTER — Emergency Department (HOSPITAL_COMMUNITY)
Admission: EM | Admit: 2020-10-25 | Discharge: 2020-10-26 | Disposition: A | Discharge status: Home / Self Care | Payer: Medicaid Other | Attending: Physician Assistant | Admitting: Physician Assistant

## 2020-10-25 ENCOUNTER — Encounter (HOSPITAL_COMMUNITY): Payer: Self-pay

## 2020-10-25 ENCOUNTER — Other Ambulatory Visit: Payer: Self-pay

## 2020-10-25 DIAGNOSIS — M791 Myalgia, unspecified site: Secondary | ICD-10-CM | POA: Insufficient documentation

## 2020-10-25 DIAGNOSIS — Z5321 Procedure and treatment not carried out due to patient leaving prior to being seen by health care provider: Secondary | ICD-10-CM | POA: Insufficient documentation

## 2020-10-25 NOTE — ED Provider Notes (Signed)
Emergency Medicine Provider Triage Evaluation Note  David Wyatt , a 60 y.o. male  was evaluated in triage.  Pt complains of body aches.  [facility] and started getting body wide cramps.  He just wants to get checked out for it. Cramps are in both arms, legs, and feet.  Started about an hour PTA. He states his pain is only when he lays down, none when he is sitting in the wheelchair now in triage.   Review of Systems  Positive: Body pain Negative: Fever  Sugar 378 per EMS  Physical Exam  There were no vitals taken for this visit. Gen:   Awake, no distress   Resp:  Normal effort  MSK:   Moves extremities without difficulty  Other:  Patient awake and alert. Answers questions with out slurred speech.   Medical Decision Making  Medically screening exam initiated at 11:20 PM.  Appropriate orders placed.  [name] was informed that the remainder of the evaluation will be completed by another provider, this initial triage assessment does not replace that evaluation, and the importance of remaining in the ED until their evaluation is complete.  Note: Portions of this report may have been transcribed using voice recognition software. Every effort was made to ensure accuracy; however, inadvertent computerized transcription errors may be present    Ollen Gross 10/25/20 2323    [place], Jenny Reichmann, MD 10/26/20 [phone]

## 2020-10-25 NOTE — ED Triage Notes (Signed)
Pt BIB EMS. Pt is coming in from [employer] with body cramps all over his body. This started tonight when pt tried to lie down for bed.

## 2020-10-26 ENCOUNTER — Telehealth: Payer: Self-pay | Admitting: Critical Care Medicine

## 2020-10-26 ENCOUNTER — Encounter: Payer: Self-pay | Admitting: *Deleted

## 2020-10-26 ENCOUNTER — Other Ambulatory Visit: Payer: Self-pay

## 2020-10-26 ENCOUNTER — Other Ambulatory Visit: Payer: Self-pay | Admitting: *Deleted

## 2020-10-26 DIAGNOSIS — I10 Essential (primary) hypertension: Secondary | ICD-10-CM

## 2020-10-26 DIAGNOSIS — E1149 Type 2 diabetes mellitus with other diabetic neurological complication: Secondary | ICD-10-CM

## 2020-10-26 DIAGNOSIS — M5126 Other intervertebral disc displacement, lumbar region: Secondary | ICD-10-CM

## 2020-10-26 DIAGNOSIS — E1165 Type 2 diabetes mellitus with hyperglycemia: Secondary | ICD-10-CM

## 2020-10-26 LAB — COMPREHENSIVE METABOLIC PANEL
ALT: 14 U/L (ref 0–44)
AST: 17 U/L (ref 15–41)
Albumin: 4.1 g/dL (ref 3.5–5.0)
Alkaline Phosphatase: 118 U/L (ref 38–126)
Anion gap: 8 (ref 5–15)
BUN: 17 mg/dL (ref 8–23)
CO2: 26 mmol/L (ref 22–32)
Calcium: 9 mg/dL (ref 8.9–10.3)
Chloride: 101 mmol/L (ref 98–111)
Creatinine, Ser: 1.31 mg/dL — ABNORMAL HIGH (ref 0.61–1.24)
GFR, Estimated: >60 mL/min (ref >60)
Glucose, Bld: 414 mg/dL — ABNORMAL HIGH (ref 70–99)
Potassium: 4 mmol/L (ref 3.5–5.1)
Sodium: 135 mmol/L (ref 135–145)
Total Bilirubin: 0.7 mg/dL (ref 0.3–1.2)
Total Protein: 7.6 g/dL (ref 6.5–8.1)

## 2020-10-26 LAB — CBC WITH DIFFERENTIAL/PLATELET
Abs Immature Granulocytes: 0.05 10*3/uL (ref 0.00–0.07)
Basophils Absolute: 0 10*3/uL (ref 0.0–0.1)
Basophils Relative: 0 %
Eosinophils Absolute: 0.2 10*3/uL (ref 0.0–0.5)
Eosinophils Relative: 2 %
HCT: 41.1 % (ref 39.0–52.0)
Hemoglobin: 13.5 g/dL (ref 13.0–17.0)
Immature Granulocytes: 1 %
Lymphocytes Relative: 21 %
Lymphs Abs: 2.1 10*3/uL (ref 0.7–4.0)
MCH: 30.9 pg (ref 26.0–34.0)
MCHC: 32.8 g/dL (ref 30.0–36.0)
MCV: 94.1 fL (ref 80.0–100.0)
Monocytes Absolute: 0.7 10*3/uL (ref 0.1–1.0)
Monocytes Relative: 7 %
Neutro Abs: 6.9 10*3/uL (ref 1.7–7.7)
Neutrophils Relative %: 69 %
Platelets: 338 10*3/uL (ref 150–400)
RBC: 4.37 MIL/uL (ref 4.22–5.81)
RDW: 12.5 % (ref 11.5–15.5)
WBC: 9.9 10*3/uL (ref 4.0–10.5)
nRBC: 0 % (ref 0.0–0.2)

## 2020-10-26 LAB — CK: Total CK: 225 U/L (ref 49–397)

## 2020-10-26 MED ORDER — DULOXETINE HCL 60 MG PO CPEP
60.0000 mg | ORAL_CAPSULE | Freq: Every day | ORAL | 6 refills | Status: DC
Start: 2020-10-26 — End: 2021-03-22
  Filled 2020-10-26: qty 30, 30d supply, fill #0

## 2020-10-26 MED ORDER — ATORVASTATIN CALCIUM 40 MG PO TABS
40.0000 mg | ORAL_TABLET | Freq: Every day | ORAL | 6 refills | Status: DC
  Filled 2020-10-26: qty 30, 30d supply, fill #0

## 2020-10-26 MED ORDER — GLIPIZIDE ER 10 MG PO TB24
10.0000 mg | ORAL_TABLET | Freq: Every day | ORAL | 6 refills | Status: DC
  Filled 2020-10-26: qty 30, 30d supply, fill #0

## 2020-10-26 MED ORDER — CYCLOBENZAPRINE HCL 10 MG PO TABS: 10.0000 mg | ORAL_TABLET | Freq: Two times a day (BID) | ORAL | 0 refills | Status: DC | PRN

## 2020-10-26 MED ORDER — AMLODIPINE BESYLATE 10 MG PO TABS
10.0000 mg | ORAL_TABLET | Freq: Every day | ORAL | 6 refills | Status: DC
Start: 2020-10-26 — End: 2021-03-22
  Filled 2020-10-26: qty 30, 30d supply, fill #0

## 2020-10-26 MED ORDER — EMPAGLIFLOZIN 10 MG PO TABS
10.0000 mg | ORAL_TABLET | Freq: Every day | ORAL | 6 refills | Status: DC
  Filled 2020-10-26: qty 30, 30d supply, fill #0

## 2020-10-26 NOTE — Telephone Encounter (Signed)
-----   Message from Gaylyn Rong, RN sent at 10/26/2020 12:28 PM EDT ----- Regarding: appointment Hi all Per dr Joya Gaskins pt needs appt 8/30, uncontrolled diab, back pain, no meds in appr 1 year. Has medicaid. Thanks, helen

## 2020-10-30 ENCOUNTER — Telehealth: Payer: Self-pay | Admitting: *Deleted

## 2020-10-30 NOTE — Telephone Encounter (Signed)
Copied from [place] [id number]. Topic: General - Other >> Oct 27, 2020  9:07 AM David Wyatt wrote: Reason for CRM: Patient called in to infrom Dr Margarita Rana that he recently had back surgery and is needing to be seen ASAP. Asking for Wyatt call back because he stated that he need to get X rays and other things done. Please call patient at Ph# [phone]

## 2020-10-31 NOTE — Telephone Encounter (Signed)
Pt has upcoming appointment 8/30 with Dr.wright.

## 2020-11-01 ENCOUNTER — Other Ambulatory Visit: Payer: Self-pay | Admitting: Critical Care Medicine

## 2020-11-01 ENCOUNTER — Encounter: Payer: Self-pay | Admitting: Physician Assistant

## 2020-11-01 ENCOUNTER — Other Ambulatory Visit: Payer: Self-pay

## 2020-11-01 DIAGNOSIS — E1149 Type 2 diabetes mellitus with other diabetic neurological complication: Secondary | ICD-10-CM

## 2020-11-01 DIAGNOSIS — M5126 Other intervertebral disc displacement, lumbar region: Secondary | ICD-10-CM

## 2020-11-01 MED ORDER — GABAPENTIN 300 MG PO CAPS
600.0000 mg | ORAL_CAPSULE | Freq: Three times a day (TID) | ORAL | 6 refills | Status: DC
  Filled 2020-11-01: qty 180, 30d supply, fill #0

## 2020-11-01 NOTE — Progress Notes (Signed)
Pt seen by Dr Joya Gaskins.  Pt w/ significant back problems.  Has had surgery in the past, fusion with bone graft at [facility].  He has not used cocaine recently, continues to smoke a few cigs per day.  Has HTN, DM, HLD, but has only recently started meds, has some meds but has not taken any medications since last year. However, is taking the medications that Bonnita Nasuti got last week.  ER visit 08/16 for pain and hyperglycemia, left AMA.  Got meds from the [facility]. Meds delivered by [facility].  Has appt w/ Dr Joya Gaskins on 08/30.  Has lots of pm back pain. Got fentanyl in the ER and it helped.  Had Suboxone and it caused N&V, did not help the pain. Gabapentin helped. Does not remember taking Flexeril.  Has not been on diabetes meds. Does not check his sugars.  144/86, 63, 96%, unable to get CBG.  Mult surgical scars on his back, all well-healed.  Lipoma noted on his back.   Pt needs a pill organizer. Need to clarify what meds he has, BP has improved. Needs to monitor CBG and make sure is on DM meds.  Needs to continue the meds given recently: see list below w/ mult meds started 08/18.   Prior to Admission medications   Medication Sig Start Date End Date Taking? Authorizing Provider  amLODipine (NORVASC) 10 MG tablet Take 1 tablet (10 mg total) by mouth daily. 10/26/20   Elsie Stain, MD  atorvastatin (LIPITOR) 40 MG tablet Take 1 tablet (40 mg total) by mouth daily. 10/26/20   Elsie Stain, MD  Blood Glucose Monitoring Suppl (ACCU-CHEK GUIDE) w/Device KIT USE AS DIRECTED TO CHECK BLOOD SUGARS UP TO TWICE PER DAY Patient not taking: No sig reported 02/17/19   [name], MD  cyclobenzaprine (FLEXERIL) 10 MG tablet Take 1 tablet (10 mg total) by mouth 2 (two) times daily as needed for muscle spasms. 10/26/20   Lucrezia Starch, MD  DULoxetine (CYMBALTA) 60 MG capsule Take 1 capsule (60 mg total) by mouth daily. 10/26/20   Elsie Stain, MD  empagliflozin (JARDIANCE) 10 MG  TABS tablet Take 1 tablet (10 mg total) by mouth daily before breakfast. 10/26/20   Elsie Stain, MD  fluticasone [facility]) 50 MCG/ACT nasal spray Place 2 sprays into both nostrils daily. 11/03/19   Argentina Donovan, PA-C  glipiZIDE (GLUCOTROL XL) 10 MG 24 hr tablet Take 1 tablet (10 mg total) by mouth daily with breakfast. 10/26/20   Elsie Stain, MD  tiZANidine (ZANAFLEX) 2 MG tablet Take 1 tablet (2 mg total) by mouth 3 (three) times daily. 07/20/20   Hazel Sams, PA-C  loratadine (CLARITIN) 10 MG tablet Take 1 tablet (10 mg total) by mouth daily. Patient not taking: No sig reported 06/03/17 07/20/20  [name], MD   Dr Joya Gaskins to restart the gabapentin.  He went to another clinic, [place], NP.    It was explained that he needs to stick to one PCP. He can go to the [facility] if he wishes, he went there yesterday because his back was so bad. However, it may be more convenient for him to stick to Dr Joya Gaskins. He states that is preferable for him to see Dr Joya Gaskins.  He wears size 11 1/2 shoes, his shoes are old and may be contributing to his back pain. Current shoes are size 11, but his toes are cramped and toenail fungus was  seen.  We will get him shoes.  He was given diabetic socks, rx sent in for gabapentin. He was given Glucerna to use as a meal or a snack.  He is encouraged to keep his appt next week in the clinic.  MRI from 2019 and OP note from 2016 at [facility] reviewed. He has lumbar spinal stenosis. He may need treatment from a Neuro surgeon, such as injections or therapies.   Rosaria Ferries, PA-C 11/01/2020 2:32 PM

## 2020-11-02 ENCOUNTER — Other Ambulatory Visit: Payer: Self-pay

## 2020-11-03 ENCOUNTER — Other Ambulatory Visit (HOSPITAL_COMMUNITY): Payer: Self-pay

## 2020-11-03 ENCOUNTER — Encounter: Payer: Self-pay | Admitting: Critical Care Medicine

## 2020-11-03 MED ORDER — LIRAGLUTIDE 18 MG/3ML ~~LOC~~ SOPN
PEN_INJECTOR | SUBCUTANEOUS | 0 refills | Status: DC
  Filled 2020-11-03: qty 6, 60d supply, fill #0

## 2020-11-03 MED ORDER — GLUCOSE BLOOD VI STRP
ORAL_STRIP | 6 refills | Status: DC
  Filled 2020-11-03: qty 100, 50d supply, fill #0

## 2020-11-03 MED ORDER — METFORMIN HCL 1000 MG PO TABS
1000.0000 mg | ORAL_TABLET | Freq: Two times a day (BID) | ORAL | 0 refills | Status: DC
  Filled 2020-11-03: qty 60, 30d supply, fill #0

## 2020-11-03 MED ORDER — INSULIN PEN NEEDLE 32G X 4 MM MISC
0 refills | Status: DC
  Filled 2020-11-03: qty 100, 90d supply, fill #0

## 2020-11-03 MED ORDER — ACCU-CHEK SOFTCLIX LANCETS MISC
6 refills | Status: DC
  Filled 2020-11-03: qty 100, 50d supply, fill #0

## 2020-11-03 MED ORDER — ACCU-CHEK GUIDE W/DEVICE KIT
PACK | 0 refills | Status: DC
  Filled 2020-11-03: qty 1, 1d supply, fill #0

## 2020-11-05 ENCOUNTER — Encounter: Payer: Self-pay | Admitting: Critical Care Medicine

## 2020-11-05 NOTE — Progress Notes (Signed)
Patient ID: David Wyatt, male   DOB: [birth year], 60 y.o.   MRN: [id number] 60 y.o.M  seen at [facility] on a work in visit with c/o polyuria and weakness  CBG obtained 515.   Pt on ony jardiance '10mg'$  daily ,glipizide '10mg'$  daily  I elected to DC glipizide, stay on jardiance and add metformin '1000mg'$  bid and add Victoza 0.'6mg'$  sq daily  I obtained for the pt the metformin and the victoza from [facility] OP pharm  I also obtained CBG testing equip an supply  When I returned his repeat CBG 414  I asked him to hydrate.  He will likely need LA insulin  I am seeing him next Tuesday Aug 30 in my clinic

## 2020-11-07 ENCOUNTER — Ambulatory Visit: Payer: Medicaid Other | Admitting: Critical Care Medicine

## 2020-11-16 ENCOUNTER — Ambulatory Visit: Payer: Medicaid Other | Admitting: Critical Care Medicine

## 2020-12-07 ENCOUNTER — Other Ambulatory Visit: Payer: Self-pay

## 2020-12-18 ENCOUNTER — Other Ambulatory Visit: Payer: Self-pay

## 2020-12-18 ENCOUNTER — Emergency Department (HOSPITAL_COMMUNITY)
Admission: EM | Admit: 2020-12-18 | Discharge: 2020-12-18 | Disposition: A | Discharge status: Home / Self Care | Payer: Medicaid Other | Attending: Emergency Medicine | Admitting: Emergency Medicine

## 2020-12-18 ENCOUNTER — Emergency Department (HOSPITAL_COMMUNITY): Payer: Medicaid Other

## 2020-12-18 DIAGNOSIS — S4991XA Unspecified injury of right shoulder and upper arm, initial encounter: Secondary | ICD-10-CM | POA: Insufficient documentation

## 2020-12-18 DIAGNOSIS — W19XXXA Unspecified fall, initial encounter: Secondary | ICD-10-CM

## 2020-12-18 DIAGNOSIS — F17210 Nicotine dependence, cigarettes, uncomplicated: Secondary | ICD-10-CM | POA: Diagnosis not present

## 2020-12-18 DIAGNOSIS — E1140 Type 2 diabetes mellitus with diabetic neuropathy, unspecified: Secondary | ICD-10-CM | POA: Insufficient documentation

## 2020-12-18 DIAGNOSIS — Z79899 Other long term (current) drug therapy: Secondary | ICD-10-CM | POA: Insufficient documentation

## 2020-12-18 DIAGNOSIS — W010XXA Fall on same level from slipping, tripping and stumbling without subsequent striking against object, initial encounter: Secondary | ICD-10-CM | POA: Diagnosis not present

## 2020-12-18 DIAGNOSIS — M25511 Pain in right shoulder: Secondary | ICD-10-CM

## 2020-12-18 DIAGNOSIS — S46001A Unspecified injury of muscle(s) and tendon(s) of the rotator cuff of right shoulder, initial encounter: Secondary | ICD-10-CM

## 2020-12-18 DIAGNOSIS — Z7984 Long term (current) use of oral hypoglycemic drugs: Secondary | ICD-10-CM | POA: Diagnosis not present

## 2020-12-18 DIAGNOSIS — I10 Essential (primary) hypertension: Secondary | ICD-10-CM | POA: Diagnosis not present

## 2020-12-18 MED ORDER — DICLOFENAC SODIUM 1 % EX GEL
2.0000 g | Freq: Four times a day (QID) | CUTANEOUS | Status: DC
  Filled 2020-12-18: qty 100

## 2020-12-18 MED ORDER — HYDROCODONE-ACETAMINOPHEN 5-325 MG PO TABS
1.0000 | ORAL_TABLET | Freq: Once | ORAL | Status: AC
  Administered 2020-12-18: 1 via ORAL
  Filled 2020-12-18: qty 1

## 2020-12-18 MED ORDER — NAPROXEN 375 MG PO TABS
375.0000 mg | ORAL_TABLET | Freq: Two times a day (BID) | ORAL | 0 refills | Status: DC
  Filled 2020-12-18: qty 20, 10d supply, fill #0

## 2020-12-18 NOTE — ED Notes (Signed)
Patient was given a [place] bag, w/ [employer].

## 2020-12-18 NOTE — ED Notes (Signed)
Patient was given a [place] bag and Ginger Ale to take with him when left.

## 2020-12-18 NOTE — ED Provider Notes (Signed)
[facility] EMERGENCY DEPARTMENT Provider Note   CSN: [id number] Arrival date & time: 12/18/20  0631     History Chief Complaint  Patient presents with   Fall   Shoulder Pain    David Wyatt is a 60 y.o. male.  The history is provided by the patient.  Fall This is a new problem. Episode onset: 4-5 days ago. The problem occurs constantly. The problem has not changed since onset.Associated symptoms comments: Patient reports that he had a fall 4 to 5 days ago and his shoulder has been hurting ever since.  The pain radiates down to his fingers and it is made significantly worse when he tries to move his shoulder.  He has been trying to put lidocaine patches on it but it is not helped.  He cannot recall what caused him to fall 4 to 5 days ago but he reports the fall is what initially started the issue.  He did not have issues with his shoulder before this.  He has not sought care before today but reports a friend who had had a shoulder dislocation in the past thought it was dislocated and was pulling on it and trying to pop it back in place.. The symptoms are aggravated by bending and twisting (lifting the arm is difficult and reports pain and weakness when lifting). The symptoms are relieved by rest. Treatments tried: Lidocaine patches. The treatment provided no relief.  Shoulder Pain     Past Medical History:  Diagnosis Date   Angioedema of lips 07/28/2012   left upper (07/29/2012)   Arthritis    hands and back   Chronic back pain    Cocaine abuse ([place])    Diabetic neuropathy (HCC)    High cholesterol    Hypertension    Neuropathy    Type II diabetes mellitus ([place])     Patient Active Problem List   Diagnosis Date Noted   Tobacco abuse 08/19/2018   Hyperlipidemia 04/24/2017   GERD (gastroesophageal reflux disease) 06/13/2015   Controlled type 2 diabetes mellitus with complication, without long-term current use of insulin ([place]) 05/01/2015   Diabetic  neuropathy ([place]) 05/01/2015   Essential hypertension, benign 11/01/2013   Lumbar disc herniation 09/13/2013   Unspecified constipation 09/13/2013   Diabetes mellitus due to underlying condition without complications ([place]) 67/02/4579   Chronic back pain 09/13/2013   Viral gastroenteritis 05/05/2013   Felon of finger of left hand with lymphangitis 12/13/2012   Hyperglycemia 07/29/2012   Angioedema of lips 07/29/2012   Crack cocaine use 07/29/2012   Arthritis 07/29/2012   Weakness generalized 07/29/2012    Past Surgical History:  Procedure Laterality Date   BACK SURGERY     HERNIA REPAIR Right 04/01/2012   I & D EXTREMITY Left 12/13/2012   Procedure: IRRIGATION AND DEBRIDEMENT LEFT THUMB;  Surgeon: Tennis Must, MD;  Location: [place];  Service: Orthopedics;  Laterality: Left;   INCISION AND DRAINAGE OF WOUND     boil on back/notes 07/14/2008 (07/29/2012)   INGUINAL HERNIA REPAIR  04/01/2012   Procedure: HERNIA REPAIR INGUINAL ADULT;  Surgeon: Haywood Lasso, MD;  Location: [place];  Service: General;  Laterality: Right;   INGUINAL HERNIA REPAIR Left 09/06/2016   Procedure: OPEN REPAIR LEFT INGUINAL HERNIA;  Surgeon: Greer Pickerel, MD;  Location: [place];  Service: General;  Laterality: Left;   INSERTION OF MESH Left 09/06/2016   Procedure: INSERTION OF MESH;  Surgeon: Greer Pickerel, MD;  Location: [place];  Service:  General;  Laterality: Left;   TONSILLECTOMY         Family History  Problem Relation Age of Onset   Kidney disease Mother    Diabetes Father     Social History   Tobacco Use   Smoking status: Every Day    Packs/day: 0.25    Years: 30.00    Pack years: 7.50    Types: Cigarettes   Smokeless tobacco: Former    Quit date: 08/15/2015   Tobacco comments:    04/29/16 2  cigs daily, 10/27/17 sometimes < .25 PPD  Vaping Use   Vaping Use: Never used  Substance Use Topics   Alcohol use: No    Alcohol/week: 0.0 standard drinks   Drug use: Yes    Types: "Crack" cocaine     Home Medications Prior to Admission medications   Medication Sig Start Date End Date Taking? Authorizing Provider  naproxen (NAPROSYN) 375 MG tablet Take 1 tablet (375 mg total) by mouth 2 (two) times daily. 12/18/20  Yes Blanchie Dessert, MD  Accu-Chek Softclix Lancets lancets Test blood sugar 2 times daily 11/03/20   Elsie Stain, MD  amLODipine (NORVASC) 10 MG tablet Take 1 tablet (10 mg total) by mouth daily. 10/26/20   Elsie Stain, MD  atorvastatin (LIPITOR) 40 MG tablet Take 1 tablet (40 mg total) by mouth daily. 10/26/20   Elsie Stain, MD  Blood Glucose Monitoring Suppl (ACCU-CHEK GUIDE) w/Device KIT USE AS DIRECTED TO CHECK BLOOD SUGARS UP TO TWICE PER DAY Patient not taking: No sig reported 02/17/19   [name], MD  Blood Glucose Monitoring Suppl (ACCU-CHEK GUIDE) w/Device KIT Use as directed 11/03/20   Elsie Stain, MD  cyclobenzaprine (FLEXERIL) 10 MG tablet Take 1 tablet (10 mg total) by mouth 2 (two) times daily as needed for muscle spasms. 10/26/20   Lucrezia Starch, MD  DULoxetine (CYMBALTA) 60 MG capsule Take 1 capsule (60 mg total) by mouth daily. 10/26/20   Elsie Stain, MD  empagliflozin (JARDIANCE) 10 MG TABS tablet Take 1 tablet (10 mg total) by mouth daily before breakfast. 10/26/20   Elsie Stain, MD  fluticasone [facility]) 50 MCG/ACT nasal spray Place 2 sprays into both nostrils daily. 11/03/19   Argentina Donovan, PA-C  gabapentin (NEURONTIN) 300 MG capsule Take 2 capsules (600 mg total) by mouth 3 (three) times daily. 11/01/20   Elsie Stain, MD  glipiZIDE (GLUCOTROL XL) 10 MG 24 hr tablet Take 1 tablet (10 mg total) by mouth daily with breakfast. 10/26/20   Elsie Stain, MD  glucose blood test strip Check blood sugar 2 times daily 11/03/20   Elsie Stain, MD  Insulin Pen Needle 32G X 4 MM MISC Use with Victoza 11/03/20   Elsie Stain, MD  liraglutide (VICTOZA) 18 MG/3ML SOPN Inject 0.6 mg once daily 11/03/20   Elsie Stain, MD  metFORMIN (GLUCOPHAGE) 1000 MG tablet Take 1 tablet (1,000 mg total) by mouth 2 (two) times daily. 11/03/20   Elsie Stain, MD  loratadine (CLARITIN) 10 MG tablet Take 1 tablet (10 mg total) by mouth daily. Patient not taking: No sig reported 06/03/17 07/20/20  [name], MD    Allergies    Lisinopril  Review of Systems   Review of Systems  All other systems reviewed and are negative.  Physical Exam Updated Vital Signs BP (!) 147/89   Pulse 65   Temp 97.6 F (36.4 C) (Oral)   Resp  16   Ht 5' 8"  (1.727 m)   Wt 72.6 kg   SpO2 100%   BMI 24.33 kg/m   Physical Exam Vitals and nursing note reviewed.  Constitutional:      General: He is not in acute distress.    Appearance: Normal appearance. He is normal weight.  HENT:     Head: Normocephalic.  Cardiovascular:     Rate and Rhythm: Normal rate.     Pulses: Normal pulses.  Pulmonary:     Effort: Pulmonary effort is normal.  Musculoskeletal:        General: Tenderness present.     Right shoulder: Swelling, tenderness and bony tenderness present. Decreased range of motion. Decreased strength. Normal pulse.     Left shoulder: Normal.     Cervical back: Normal range of motion and neck supple.     Comments: Tenderness over the right AC joint and swelling noted.  Full passive range of motion but is painful.  Unable to abduct at the shoulder or internally and externally rotate.  Unclear if this is related to pain or weakness.  2+ right radial pulse and normal capillary refill.  No clavicle tenderness.  Skin:    General: Skin is warm and dry.  Neurological:     Mental Status: He is alert and oriented to person, place, and time. Mental status is at baseline.     Comments: Sensation intact in the hand and hand grip normal.  However patient unable to abduct the shoulder.    ED Results / Procedures / Treatments   Labs (all labs ordered are listed, but only abnormal results are displayed) Labs Reviewed - No  data to display  EKG None  Radiology DG Shoulder Right  Result Date: 12/18/2020 CLINICAL DATA:  60 year old male status post fall 3-4 days ago with continued pain and decreased range of motion. EXAM: RIGHT SHOULDER - 2+ VIEW COMPARISON:  Right shoulder series 01/26/2012. FINDINGS: Bone mineralization is within normal limits for age. No glenohumeral joint dislocation. Proximal right humerus, right clavicle and scapula appear to remain intact. Mild degenerative changes for age. Negative visible right ribs and chest. IMPRESSION: No acute fracture or dislocation identified about the right shoulder. Electronically Signed   By: Genevie Ann M.D.   On: 12/18/2020 07:26    Procedures Procedures   Medications Ordered in ED Medications  diclofenac Sodium (VOLTAREN) 1 % topical gel 2 g (has no administration in time range)  HYDROcodone-acetaminophen (NORCO/VICODIN) 5-325 MG per tablet 1 tablet (1 tablet Oral Given 12/18/20 1045)    ED Course  I have reviewed the triage vital signs and the nursing notes.  Pertinent labs & imaging results that were available during my care of the patient were reviewed by me and considered in my medical decision making (see chart for details).    MDM Rules/Calculators/A&P                           Patient with a fall 4 to 5 days ago with ongoing right shoulder pain that radiates down the arm.  Patient is vascularly intact.  Unable to abduct at the shoulder but unclear if this is related to pain or weakness.  Concern for possible ligament disruption of the rotator cuff.  No evidence of dislocation or fracture based on x-ray today.  Patient placed in a sling given anti-inflammatories and pain control but will need follow-up with orthopedics.  MDM   Amount and/or  Complexity of Data Reviewed Tests in the radiology section of CPT: ordered and reviewed Independent visualization of images, tracings, or specimens: yes    Final Clinical Impression(s) / ED  Diagnoses Final diagnoses:  Fall, initial encounter  Acute pain of right shoulder  Rotator cuff injury, right, initial encounter    Rx / DC Orders ED Discharge Orders          Ordered    naproxen (NAPROSYN) 375 MG tablet  2 times daily        12/18/20 1117             Blanchie Dessert, MD 12/18/20 1117

## 2020-12-18 NOTE — Discharge Instructions (Addendum)
There is no sign of your shoulder being dislocated today but you have probably injured your rotator cuff.  If your symptoms do not improve in 1 week you need to see the specialist because they may have to do more evaluation and fix her rotator cuff.

## 2020-12-18 NOTE — ED Triage Notes (Addendum)
Pt reports he tripped and fell 4 days ago and is having right shoulder pain. Pt also c/o back pain.

## 2020-12-22 ENCOUNTER — Ambulatory Visit: Payer: Medicaid Other | Admitting: Surgical

## 2020-12-25 ENCOUNTER — Other Ambulatory Visit: Payer: Self-pay

## 2020-12-26 ENCOUNTER — Other Ambulatory Visit: Payer: Self-pay

## 2020-12-26 ENCOUNTER — Encounter: Payer: Self-pay | Admitting: Family Medicine

## 2020-12-26 ENCOUNTER — Telehealth: Payer: Self-pay

## 2020-12-26 ENCOUNTER — Ambulatory Visit: Payer: Medicaid Other | Attending: Family Medicine | Admitting: Family Medicine

## 2020-12-26 VITALS — BP 122/76 | HR 81 | Ht 68.0 in | Wt 151.6 lb

## 2020-12-26 DIAGNOSIS — I152 Hypertension secondary to endocrine disorders: Secondary | ICD-10-CM

## 2020-12-26 DIAGNOSIS — Z1211 Encounter for screening for malignant neoplasm of colon: Secondary | ICD-10-CM | POA: Diagnosis not present

## 2020-12-26 DIAGNOSIS — E1159 Type 2 diabetes mellitus with other circulatory complications: Secondary | ICD-10-CM | POA: Insufficient documentation

## 2020-12-26 DIAGNOSIS — I10 Essential (primary) hypertension: Secondary | ICD-10-CM | POA: Insufficient documentation

## 2020-12-26 DIAGNOSIS — Z794 Long term (current) use of insulin: Secondary | ICD-10-CM | POA: Insufficient documentation

## 2020-12-26 DIAGNOSIS — Z833 Family history of diabetes mellitus: Secondary | ICD-10-CM | POA: Insufficient documentation

## 2020-12-26 DIAGNOSIS — Z5900 Homelessness unspecified: Secondary | ICD-10-CM

## 2020-12-26 DIAGNOSIS — E1165 Type 2 diabetes mellitus with hyperglycemia: Secondary | ICD-10-CM | POA: Insufficient documentation

## 2020-12-26 DIAGNOSIS — R195 Other fecal abnormalities: Secondary | ICD-10-CM | POA: Insufficient documentation

## 2020-12-26 DIAGNOSIS — E1169 Type 2 diabetes mellitus with other specified complication: Secondary | ICD-10-CM | POA: Insufficient documentation

## 2020-12-26 DIAGNOSIS — Z713 Dietary counseling and surveillance: Secondary | ICD-10-CM | POA: Diagnosis not present

## 2020-12-26 DIAGNOSIS — E1140 Type 2 diabetes mellitus with diabetic neuropathy, unspecified: Secondary | ICD-10-CM | POA: Insufficient documentation

## 2020-12-26 DIAGNOSIS — M7501 Adhesive capsulitis of right shoulder: Secondary | ICD-10-CM | POA: Diagnosis not present

## 2020-12-26 DIAGNOSIS — Z5902 Unsheltered homelessness: Secondary | ICD-10-CM | POA: Diagnosis not present

## 2020-12-26 DIAGNOSIS — Z888 Allergy status to other drugs, medicaments and biological substances status: Secondary | ICD-10-CM | POA: Insufficient documentation

## 2020-12-26 DIAGNOSIS — Z7985 Long-term (current) use of injectable non-insulin antidiabetic drugs: Secondary | ICD-10-CM | POA: Insufficient documentation

## 2020-12-26 LAB — POCT GLYCOSYLATED HEMOGLOBIN (HGB A1C): HbA1c, POC (controlled diabetic range): 11.5 % — AB (ref 0.0–7.0)

## 2020-12-26 LAB — GLUCOSE, POCT (MANUAL RESULT ENTRY): POC Glucose: 259 mg/dl — AB (ref 70–99)

## 2020-12-26 MED ORDER — LIRAGLUTIDE 18 MG/3ML ~~LOC~~ SOPN
PEN_INJECTOR | SUBCUTANEOUS | 6 refills | Status: DC
  Filled 2020-12-26: qty 9, 90d supply, fill #0

## 2020-12-26 MED ORDER — CYCLOBENZAPRINE HCL 10 MG PO TABS
10.0000 mg | ORAL_TABLET | Freq: Two times a day (BID) | ORAL | 6 refills | Status: DC | PRN
Start: 2020-12-26 — End: 2021-10-15
  Filled 2020-12-26: qty 40, 20d supply, fill #0

## 2020-12-26 MED ORDER — KETOROLAC TROMETHAMINE 60 MG/2ML IM SOLN
60.0000 mg | Freq: Once | INTRAMUSCULAR | Status: AC
  Administered 2020-12-26: 60 mg via INTRAMUSCULAR

## 2020-12-26 MED ORDER — METFORMIN HCL 1000 MG PO TABS
1000.0000 mg | ORAL_TABLET | Freq: Two times a day (BID) | ORAL | 6 refills | Status: DC
  Filled 2020-12-26: qty 60, 30d supply, fill #0
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
## 2020-12-26 NOTE — Patient Instructions (Signed)
Diabetes Mellitus and Foot Care ?Foot care is an important part of your health, especially when you have diabetes. Diabetes may cause you to have problems because of poor blood flow (circulation) to your feet and legs, which can cause your skin to: ?Become thinner and drier. ?Break more easily. ?Heal more slowly. ?Peel and crack. ?You may also have nerve damage (neuropathy) in your legs and feet, causing decreased feeling in them. This means that you may not notice minor injuries to your feet that could lead to more serious problems. Noticing and addressing any potential problems early is the best way to prevent future foot problems. ?How to care for your feet ?Foot hygiene ? ?Wash your feet daily with warm water and mild soap. Do not use hot water. Then, pat your feet and the areas between your toes until they are completely dry. Do not soak your feet as this can dry your skin. ?Trim your toenails straight across. Do not dig under them or around the cuticle. File the edges of your nails with an emery board or nail file. ?Apply a moisturizing lotion or petroleum jelly to the skin on your feet and to dry, brittle toenails. Use lotion that does not contain alcohol and is unscented. Do not apply lotion between your toes. ?Shoes and socks ?Wear clean socks or stockings every day. Make sure they are not too tight. Do not wear knee-high stockings since they may decrease blood flow to your legs. ?Wear shoes that fit properly and have enough cushioning. Always look in your shoes before you put them on to be sure there are no objects inside. ?To break in new shoes, wear them for just a few hours a day. This prevents injuries on your feet. ?Wounds, scrapes, corns, and calluses ? ?Check your feet daily for blisters, cuts, bruises, sores, and redness. If you cannot see the bottom of your feet, use a mirror or ask someone for help. ?Do not cut corns or calluses or try to remove them with medicine. ?If you find a minor scrape,  cut, or break in the skin on your feet, keep it and the skin around it clean and dry. You may clean these areas with mild soap and water. Do not clean the area with peroxide, alcohol, or iodine. ?If you have a wound, scrape, corn, or callus on your foot, look at it several times a day to make sure it is healing and not infected. Check for: ?Redness, swelling, or pain. ?Fluid or blood. ?Warmth. ?Pus or a bad smell. ?General tips ?Do not cross your legs. This may decrease blood flow to your feet. ?Do not use heating pads or hot water bottles on your feet. They may burn your skin. If you have lost feeling in your feet or legs, you may not know this is happening until it is too late. ?Protect your feet from hot and cold by wearing shoes, such as at the beach or on hot pavement. ?Schedule a complete foot exam at least once a year (annually) or more often if you have foot problems. Report any cuts, sores, or bruises to your health care provider immediately. ?Where to find more information ?American Diabetes Association: www.diabetes.org ?Association of Diabetes Care & Education Specialists: www.diabeteseducator.org ?Contact a health care provider if: ?You have a medical condition that increases your risk of infection and you have any cuts, sores, or bruises on your feet. ?You have an injury that is not healing. ?You have redness on your legs or feet. ?You   feel burning or tingling in your legs or feet. ?You have pain or cramps in your legs and feet. ?Your legs or feet are numb. ?Your feet always feel cold. ?You have pain around any toenails. ?Get help right away if: ?You have a wound, scrape, corn, or callus on your foot and: ?You have pain, swelling, or redness that gets worse. ?You have fluid or blood coming from the wound, scrape, corn, or callus. ?Your wound, scrape, corn, or callus feels warm to the touch. ?You have pus or a bad smell coming from the wound, scrape, corn, or callus. ?You have a fever. ?You have a red  line going up your leg. ?Summary ?Check your feet every day for blisters, cuts, bruises, sores, and redness. ?Apply a moisturizing lotion or petroleum jelly to the skin on your feet and to dry, brittle toenails. ?Wear shoes that fit properly and have enough cushioning. ?If you have foot problems, report any cuts, sores, or bruises to your health care provider immediately. ?Schedule a complete foot exam at least once a year (annually) or more often if you have foot problems. ?This information is not intended to replace advice given to you by your health care provider. Make sure you discuss any questions you have with your health care provider. ?Document Revised: 09/16/2019 Document Reviewed: 09/16/2019 ?Elsevier Patient Education ? 2022 Elsevier Inc. ? ?

## 2020-12-27 ENCOUNTER — Ambulatory Visit: Payer: Medicaid Other | Admitting: Family Medicine

## 2020-12-27 LAB — MICROALBUMIN / CREATININE URINE RATIO
Creatinine, Urine: 115.4 mg/dL
Microalb/Creat Ratio: 10 mg/g creat (ref 0–29)
Microalbumin, Urine: 11.7 ug/mL

## 2020-12-30 ENCOUNTER — Emergency Department (HOSPITAL_COMMUNITY): Payer: Medicaid Other

## 2020-12-30 ENCOUNTER — Encounter (HOSPITAL_COMMUNITY): Payer: Self-pay

## 2020-12-30 ENCOUNTER — Other Ambulatory Visit: Payer: Self-pay

## 2020-12-30 ENCOUNTER — Emergency Department (HOSPITAL_COMMUNITY)
Admission: EM | Admit: 2020-12-30 | Discharge: 2020-12-30 | Disposition: A | Discharge status: Home / Self Care | Payer: Medicaid Other | Attending: Emergency Medicine | Admitting: Emergency Medicine

## 2020-12-30 DIAGNOSIS — E1165 Type 2 diabetes mellitus with hyperglycemia: Secondary | ICD-10-CM | POA: Diagnosis not present

## 2020-12-30 DIAGNOSIS — Z7984 Long term (current) use of oral hypoglycemic drugs: Secondary | ICD-10-CM | POA: Diagnosis not present

## 2020-12-30 DIAGNOSIS — F17210 Nicotine dependence, cigarettes, uncomplicated: Secondary | ICD-10-CM | POA: Insufficient documentation

## 2020-12-30 DIAGNOSIS — Z794 Long term (current) use of insulin: Secondary | ICD-10-CM | POA: Diagnosis not present

## 2020-12-30 DIAGNOSIS — M25511 Pain in right shoulder: Secondary | ICD-10-CM | POA: Diagnosis not present

## 2020-12-30 DIAGNOSIS — I10 Essential (primary) hypertension: Secondary | ICD-10-CM | POA: Insufficient documentation

## 2020-12-30 DIAGNOSIS — G8929 Other chronic pain: Secondary | ICD-10-CM

## 2020-12-30 DIAGNOSIS — R739 Hyperglycemia, unspecified: Secondary | ICD-10-CM

## 2020-12-30 LAB — BASIC METABOLIC PANEL
Anion gap: 10 (ref 5–15)
BUN: 11 mg/dL (ref 8–23)
CO2: 25 mmol/L (ref 22–32)
Calcium: 9.5 mg/dL (ref 8.9–10.3)
Chloride: 101 mmol/L (ref 98–111)
Creatinine, Ser: 0.93 mg/dL (ref 0.61–1.24)
GFR, Estimated: >60 mL/min (ref >60)
Glucose, Bld: 449 mg/dL — ABNORMAL HIGH (ref 70–99)
Potassium: 4.4 mmol/L (ref 3.5–5.1)
Sodium: 136 mmol/L (ref 135–145)

## 2020-12-30 LAB — CBC WITH DIFFERENTIAL/PLATELET
Abs Immature Granulocytes: 0.02 10*3/uL (ref 0.00–0.07)
Basophils Absolute: 0.1 10*3/uL (ref 0.0–0.1)
Basophils Relative: 1 %
Eosinophils Absolute: 0.1 10*3/uL (ref 0.0–0.5)
Eosinophils Relative: 3 %
HCT: 46 % (ref 39.0–52.0)
Hemoglobin: 15 g/dL (ref 13.0–17.0)
Immature Granulocytes: 0 %
Lymphocytes Relative: 27 %
Lymphs Abs: 1.5 10*3/uL (ref 0.7–4.0)
MCH: 31.1 pg (ref 26.0–34.0)
MCHC: 32.6 g/dL (ref 30.0–36.0)
MCV: 95.4 fL (ref 80.0–100.0)
Monocytes Absolute: 0.4 10*3/uL (ref 0.1–1.0)
Monocytes Relative: 8 %
Neutro Abs: 3.4 10*3/uL (ref 1.7–7.7)
Neutrophils Relative %: 61 %
Platelets: 408 10*3/uL — ABNORMAL HIGH (ref 150–400)
RBC: 4.82 MIL/uL (ref 4.22–5.81)
RDW: 12.6 % (ref 11.5–15.5)
WBC: 5.4 10*3/uL (ref 4.0–10.5)
nRBC: 0 % (ref 0.0–0.2)

## 2020-12-30 LAB — CBG MONITORING, ED
Glucose-Capillary: 505 mg/dL (ref 70–99)
Glucose-Capillary: 285 mg/dL — ABNORMAL HIGH (ref 70–99)

## 2020-12-30 MED ORDER — INSULIN ASPART 100 UNIT/ML IJ SOLN
10.0000 [IU] | Freq: Once | INTRAMUSCULAR | Status: AC
  Administered 2020-12-30: 10 [IU] via SUBCUTANEOUS

## 2020-12-30 MED ORDER — SODIUM CHLORIDE 0.9 % IV BOLUS: 1000.0000 mL | Freq: Once | INTRAVENOUS | Status: DC

## 2020-12-30 MED ORDER — SODIUM CHLORIDE 0.9 % IV BOLUS
1000.0000 mL | Freq: Once | INTRAVENOUS | Status: AC
  Administered 2020-12-30: 1000 mL via INTRAVENOUS
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
## 2020-12-30 NOTE — ED Notes (Signed)
Pt verbalizes understanding of discharge instructions. Opportunity for questions and answers were provided. Pt discharged from the ED.   ?

## 2020-12-30 NOTE — Discharge Instructions (Addendum)
Your imaging today was negative for acute injury in the neck, head, or shoulder. Please keep your sling on. Take Tylenol and Motrin for pain in the meantime. Follow up with orthopedic surgery. A referral was placed, they should call you to schedule an appointment. You need an MRI and further testing from them Take all medications as prescribed including your insulin and other diabetes medications. Return to the ED with new or worsening symptoms.

## 2020-12-30 NOTE — ED Triage Notes (Signed)
Pt bib [facility] with complaints of Right shoulder and Right arm pain. Pt does have a follow up on Wednesday for arm pain with medications that he hasn't taken since receiving them. Upon arrival, pt cbg was 498. AOx4. EMS vitals: 140/90, 90HR, 18R,96% RA,

## 2020-12-31 LAB — I-STAT VENOUS BLOOD GAS, ED
Acid-Base Excess: 2 mmol/L (ref 0.0–2.0)
Bicarbonate: 27.1 mmol/L (ref 20.0–28.0)
Calcium, Ion: 1.11 mmol/L — ABNORMAL LOW (ref 1.15–1.40)
HCT: 43 % (ref 39.0–52.0)
Hemoglobin: 14.6 g/dL (ref 13.0–17.0)
O2 Saturation: 100 %
Potassium: 4.3 mmol/L (ref 3.5–5.1)
Sodium: 137 mmol/L (ref 135–145)
TCO2: 28 mmol/L (ref 22–32)
pCO2, Ven: 44.8 mmHg (ref 44.0–60.0)
pH, Ven: 7.39 (ref 7.250–7.430)
pO2, Ven: 199 mmHg — ABNORMAL HIGH (ref 32.0–45.0)

## 2021-01-03 ENCOUNTER — Ambulatory Visit: Payer: Medicaid Other

## 2021-01-04 ENCOUNTER — Ambulatory Visit: Payer: Medicaid Other | Admitting: Orthopaedic Surgery

## 2021-01-31 ENCOUNTER — Ambulatory Visit: Payer: Medicaid Other | Admitting: Family Medicine

## 2021-02-05 ENCOUNTER — Ambulatory Visit: Payer: Medicaid Other | Admitting: Physician Assistant

## 2021-02-17 ENCOUNTER — Emergency Department (HOSPITAL_COMMUNITY)
Admission: EM | Admit: 2021-02-17 | Discharge: 2021-02-17 | Disposition: A | Discharge status: Home / Self Care | Payer: Medicaid Other | Source: Home / Self Care | Attending: Emergency Medicine | Admitting: Emergency Medicine

## 2021-02-17 ENCOUNTER — Encounter (HOSPITAL_COMMUNITY): Payer: Self-pay | Admitting: Emergency Medicine

## 2021-02-17 ENCOUNTER — Emergency Department (HOSPITAL_COMMUNITY): Payer: Medicaid Other

## 2021-02-17 ENCOUNTER — Emergency Department (HOSPITAL_COMMUNITY)
Admission: EM | Admit: 2021-02-17 | Discharge: 2021-02-17 | Disposition: A | Discharge status: Home / Self Care | Payer: Medicaid Other | Attending: Emergency Medicine | Admitting: Emergency Medicine

## 2021-02-17 ENCOUNTER — Other Ambulatory Visit: Payer: Self-pay

## 2021-02-17 DIAGNOSIS — Y900 Blood alcohol level of less than 20 mg/100 ml: Secondary | ICD-10-CM | POA: Insufficient documentation

## 2021-02-17 DIAGNOSIS — N23 Unspecified renal colic: Secondary | ICD-10-CM | POA: Insufficient documentation

## 2021-02-17 DIAGNOSIS — M545 Low back pain, unspecified: Secondary | ICD-10-CM | POA: Insufficient documentation

## 2021-02-17 DIAGNOSIS — Z7984 Long term (current) use of oral hypoglycemic drugs: Secondary | ICD-10-CM | POA: Insufficient documentation

## 2021-02-17 DIAGNOSIS — I10 Essential (primary) hypertension: Secondary | ICD-10-CM | POA: Insufficient documentation

## 2021-02-17 DIAGNOSIS — Z79899 Other long term (current) drug therapy: Secondary | ICD-10-CM | POA: Insufficient documentation

## 2021-02-17 DIAGNOSIS — Z794 Long term (current) use of insulin: Secondary | ICD-10-CM | POA: Insufficient documentation

## 2021-02-17 DIAGNOSIS — F17210 Nicotine dependence, cigarettes, uncomplicated: Secondary | ICD-10-CM | POA: Insufficient documentation

## 2021-02-17 DIAGNOSIS — Z5321 Procedure and treatment not carried out due to patient leaving prior to being seen by health care provider: Secondary | ICD-10-CM | POA: Insufficient documentation

## 2021-02-17 DIAGNOSIS — E1140 Type 2 diabetes mellitus with diabetic neuropathy, unspecified: Secondary | ICD-10-CM | POA: Diagnosis not present

## 2021-02-17 LAB — COMPREHENSIVE METABOLIC PANEL
ALT: 23 U/L (ref 0–44)
ALT: 25 U/L (ref 0–44)
AST: 23 U/L (ref 15–41)
AST: 23 U/L (ref 15–41)
Albumin: 3.8 g/dL (ref 3.5–5.0)
Albumin: 4.8 g/dL (ref 3.5–5.0)
Alkaline Phosphatase: 123 U/L (ref 38–126)
Alkaline Phosphatase: 125 U/L (ref 38–126)
Anion gap: 12 (ref 5–15)
Anion gap: 14 (ref 5–15)
BUN: 24 mg/dL — ABNORMAL HIGH (ref 8–23)
BUN: 26 mg/dL — ABNORMAL HIGH (ref 8–23)
CO2: 22 mmol/L (ref 22–32)
CO2: 24 mmol/L (ref 22–32)
Calcium: 9.3 mg/dL (ref 8.9–10.3)
Calcium: 10 mg/dL (ref 8.9–10.3)
Chloride: 92 mmol/L — ABNORMAL LOW (ref 98–111)
Chloride: 94 mmol/L — ABNORMAL LOW (ref 98–111)
Creatinine, Ser: 1.6 mg/dL — ABNORMAL HIGH (ref 0.61–1.24)
Creatinine, Ser: 1.54 mg/dL — ABNORMAL HIGH (ref 0.61–1.24)
GFR, Estimated: 49 mL/min — ABNORMAL LOW (ref >60)
GFR, Estimated: 51 mL/min — ABNORMAL LOW (ref >60)
Glucose, Bld: 648 mg/dL (ref 70–99)
Glucose, Bld: 407 mg/dL — ABNORMAL HIGH (ref 70–99)
Potassium: 4.7 mmol/L (ref 3.5–5.1)
Potassium: 5.2 mmol/L — ABNORMAL HIGH (ref 3.5–5.1)
Sodium: 126 mmol/L — ABNORMAL LOW (ref 135–145)
Sodium: 132 mmol/L — ABNORMAL LOW (ref 135–145)
Total Bilirubin: 0.9 mg/dL (ref 0.3–1.2)
Total Bilirubin: 1.8 mg/dL — ABNORMAL HIGH (ref 0.3–1.2)
Total Protein: 7.7 g/dL (ref 6.5–8.1)
Total Protein: 9.4 g/dL — ABNORMAL HIGH (ref 6.5–8.1)

## 2021-02-17 LAB — URINALYSIS, ROUTINE W REFLEX MICROSCOPIC
Bacteria, UA: NONE SEEN
Bilirubin Urine: NEGATIVE
Glucose, UA: >=500 mg/dL — AB
Ketones, ur: 5 mg/dL — AB
Leukocytes,Ua: NEGATIVE
Nitrite: NEGATIVE
Protein, ur: NEGATIVE mg/dL
Specific Gravity, Urine: 1.028 (ref 1.005–1.030)
pH: 5 (ref 5.0–8.0)

## 2021-02-17 LAB — I-STAT VENOUS BLOOD GAS, ED
Acid-Base Excess: 2 mmol/L (ref 0.0–2.0)
Bicarbonate: 26.7 mmol/L (ref 20.0–28.0)
Calcium, Ion: 1.13 mmol/L — ABNORMAL LOW (ref 1.15–1.40)
HCT: 48 % (ref 39.0–52.0)
Hemoglobin: 16.3 g/dL (ref 13.0–17.0)
O2 Saturation: 45 %
Patient temperature: 37
Potassium: 4.8 mmol/L (ref 3.5–5.1)
Sodium: 127 mmol/L — ABNORMAL LOW (ref 135–145)
TCO2: 28 mmol/L (ref 22–32)
pCO2, Ven: 42.7 mmHg — ABNORMAL LOW (ref 44.0–60.0)
pH, Ven: 7.403 (ref 7.250–7.430)
pO2, Ven: 25 mmHg — CL (ref 32.0–45.0)

## 2021-02-17 LAB — CBC WITH DIFFERENTIAL/PLATELET
Abs Immature Granulocytes: 0.05 10*3/uL (ref 0.00–0.07)
Abs Immature Granulocytes: 0.03 10*3/uL (ref 0.00–0.07)
Basophils Absolute: 0.1 10*3/uL (ref 0.0–0.1)
Basophils Absolute: 0.1 10*3/uL (ref 0.0–0.1)
Basophils Relative: 1 %
Basophils Relative: 1 %
Eosinophils Absolute: 0.1 10*3/uL (ref 0.0–0.5)
Eosinophils Absolute: 0 10*3/uL (ref 0.0–0.5)
Eosinophils Relative: 1 %
Eosinophils Relative: 0 %
HCT: 44.4 % (ref 39.0–52.0)
HCT: 51.6 % (ref 39.0–52.0)
Hemoglobin: 15.2 g/dL (ref 13.0–17.0)
Hemoglobin: 17.4 g/dL — ABNORMAL HIGH (ref 13.0–17.0)
Immature Granulocytes: 1 %
Immature Granulocytes: 0 %
Lymphocytes Relative: 11 %
Lymphocytes Relative: 16 %
Lymphs Abs: 1.1 10*3/uL (ref 0.7–4.0)
Lymphs Abs: 1.5 10*3/uL (ref 0.7–4.0)
MCH: 31.3 pg (ref 26.0–34.0)
MCH: 31.2 pg (ref 26.0–34.0)
MCHC: 34.2 g/dL (ref 30.0–36.0)
MCHC: 33.7 g/dL (ref 30.0–36.0)
MCV: 91.4 fL (ref 80.0–100.0)
MCV: 92.5 fL (ref 80.0–100.0)
Monocytes Absolute: 0.6 10*3/uL (ref 0.1–1.0)
Monocytes Absolute: 0.7 10*3/uL (ref 0.1–1.0)
Monocytes Relative: 6 %
Monocytes Relative: 7 %
Neutro Abs: 7.4 10*3/uL (ref 1.7–7.7)
Neutro Abs: 7.2 10*3/uL (ref 1.7–7.7)
Neutrophils Relative %: 80 %
Neutrophils Relative %: 76 %
Platelets: 387 10*3/uL (ref 150–400)
Platelets: 376 10*3/uL (ref 150–400)
RBC: 4.86 MIL/uL (ref 4.22–5.81)
RBC: 5.58 MIL/uL (ref 4.22–5.81)
RDW: 12 % (ref 11.5–15.5)
RDW: 12.3 % (ref 11.5–15.5)
WBC: 9.2 10*3/uL (ref 4.0–10.5)
WBC: 9.6 10*3/uL (ref 4.0–10.5)
nRBC: 0 % (ref 0.0–0.2)
nRBC: 0 % (ref 0.0–0.2)

## 2021-02-17 LAB — I-STAT CHEM 8, ED
BUN: 31 mg/dL — ABNORMAL HIGH (ref 8–23)
Calcium, Ion: 1.13 mmol/L — ABNORMAL LOW (ref 1.15–1.40)
Chloride: 93 mmol/L — ABNORMAL LOW (ref 98–111)
Creatinine, Ser: 1.5 mg/dL — ABNORMAL HIGH (ref 0.61–1.24)
Glucose, Bld: 632 mg/dL (ref 70–99)
HCT: 51 % (ref 39.0–52.0)
Hemoglobin: 17.3 g/dL — ABNORMAL HIGH (ref 13.0–17.0)
Potassium: 4.9 mmol/L (ref 3.5–5.1)
Sodium: 127 mmol/L — ABNORMAL LOW (ref 135–145)
TCO2: 26 mmol/L (ref 22–32)

## 2021-02-17 LAB — RAPID URINE DRUG SCREEN, HOSP PERFORMED
Amphetamines: NOT DETECTED
Barbiturates: NOT DETECTED
Benzodiazepines: NOT DETECTED
Cocaine: POSITIVE — AB
Opiates: NOT DETECTED
Tetrahydrocannabinol: NOT DETECTED

## 2021-02-17 LAB — BETA-HYDROXYBUTYRIC ACID: Beta-Hydroxybutyric Acid: 0.54 mmol/L — ABNORMAL HIGH (ref 0.05–0.27)

## 2021-02-17 LAB — LIPASE, BLOOD: Lipase: 34 U/L (ref 11–51)

## 2021-02-17 LAB — CBG MONITORING, ED
Glucose-Capillary: >600 mg/dL (ref 70–99)
Glucose-Capillary: 405 mg/dL — ABNORMAL HIGH (ref 70–99)

## 2021-02-17 LAB — ETHANOL: Alcohol, Ethyl (B): <10 mg/dL (ref <10)

## 2021-02-17 MED ORDER — KETOROLAC TROMETHAMINE 15 MG/ML IJ SOLN
15.0000 mg | Freq: Once | INTRAMUSCULAR | Status: AC
  Administered 2021-02-17: 15 mg via INTRAVENOUS
  Filled 2021-02-17: qty 1

## 2021-02-17 MED ORDER — NAPROXEN 250 MG PO TABS
500.0000 mg | ORAL_TABLET | Freq: Once | ORAL | Status: AC
  Administered 2021-02-17: 500 mg via ORAL
  Filled 2021-02-17: qty 2

## 2021-02-17 MED ORDER — MORPHINE SULFATE (PF) 4 MG/ML IV SOLN
4.0000 mg | Freq: Once | INTRAVENOUS | Status: AC
  Administered 2021-02-17: 4 mg via INTRAVENOUS
  Filled 2021-02-17: qty 1

## 2021-02-17 MED ORDER — HYDROCODONE-ACETAMINOPHEN 5-325 MG PO TABS: 1.0000 | ORAL_TABLET | Freq: Four times a day (QID) | ORAL | 0 refills | Status: DC | PRN

## 2021-02-17 MED ORDER — SODIUM CHLORIDE 0.9 % IV BOLUS
1000.0000 mL | Freq: Once | INTRAVENOUS | Status: AC
  Administered 2021-02-17: 1000 mL via INTRAVENOUS

## 2021-02-17 MED ORDER — ONDANSETRON HCL 4 MG PO TABS: 4.0000 mg | ORAL_TABLET | Freq: Four times a day (QID) | ORAL | 0 refills | Status: DC

## 2021-02-17 NOTE — ED Triage Notes (Signed)
Patient reports right flank pain starting last night, rated 10/10. Says EMS took him to [facility] but he was tired of waiting so he came to [facility]. Denies injury.

## 2021-02-17 NOTE — ED Notes (Signed)
Pt laid blanket in the floor of triage room and lie screaming in the floor for pain medication. Pt given ordered naproxen and pt assisted to wheelchair to the lobby to wait for room.  Explained to patient triage process and reason for wait time at this time.  Pt cursed repeatedly "fuck this shit" "don't play with me" .  Pt wheeled to lobby in NAD

## 2021-02-17 NOTE — ED Notes (Signed)
Called pt x3 for room, no response. This NT witness pt walking outside.

## 2021-02-17 NOTE — ED Triage Notes (Signed)
BIB EMS from home.  Hx of back sx.  Chronic back pain. Hurting tonight not like his usual pain.  BP 198/120.  Hx of same per patient.  98% on RA.  HR 74.  Back pain radiates down right side.

## 2021-02-17 NOTE — ED Provider Notes (Signed)
Emergency Medicine Provider Triage Evaluation Note  David Wyatt , a 60 y.o. male  was evaluated in triage.  Pt complains of constant right low back pain onset today. Always has low back pain with pain radiating down the RLE, but this feels different.  Pain worse with palpation.  No medications taken PTA for symptoms.  Denies trauma, injury, fall, IVDU, fevers.  Review of Systems  Positive: As above Negative: As above  Physical Exam  There were no vitals taken for this visit. Gen:   Awake, no distress   Resp:  Normal effort  MSK:   Moves extremities without difficulty  Other:  TTP to the right lumbar paraspinal muscles. No TTP to the lumbosacral midline.  No bony deformities, step-offs, crepitus.  Medical Decision Making  Medically screening exam initiated at 3:56 AM.  Appropriate orders placed.  [name] was informed that the remainder of the evaluation will be completed by another provider, this initial triage assessment does not replace that evaluation, and the importance of remaining in the ED until their evaluation is complete.  R low back pain   Antonietta Breach, PA-C 02/17/21 0409    Isla Pence, MD 02/17/21 [phone]

## 2021-02-17 NOTE — Discharge Instructions (Addendum)
Return for any problem.   Take ibuprofen - 600 mg every 8 hours - for initial control pain.  If pain is not controlled you may use prescribed pain medications.  Zofran is  prescribed for nausea if needed.

## 2021-02-17 NOTE — ED Provider Notes (Signed)
[facility] DEPT Provider Note   CSN: [id number] Arrival date & time: 02/17/21  1133     History Chief Complaint  Patient presents with   Flank Pain    David Wyatt is a 60 y.o. male.  60 year old male with prior medical history as detailed below presents for evaluation.  Patient with right flank discomfort that started yesterday.  Patient denies associated nausea or vomiting.  Denies trauma.  He denies fever.  He did not take anything for his pain.  Apparently, patient was triaged at [facility] earlier this morning.  Secondary to prolonged wait time he decided to come to [facility] for evaluation.  The history is provided by the patient.  Flank Pain This is a new problem. The current episode started yesterday. The problem occurs constantly. The problem has not changed since onset.Pertinent negatives include no chest pain and no abdominal pain. Nothing aggravates the symptoms. Nothing relieves the symptoms.      Past Medical History:  Diagnosis Date   Angioedema of lips 07/28/2012   left upper (07/29/2012)   Arthritis    hands and back   Chronic back pain    Cocaine abuse ([place])    Diabetic neuropathy ([facility])    High cholesterol    Hypertension    Neuropathy    Type II diabetes mellitus ([place])     Patient Active Problem List   Diagnosis Date Noted   Tobacco abuse 08/19/2018   Hyperlipidemia 04/24/2017   GERD (gastroesophageal reflux disease) 06/13/2015   Controlled type 2 diabetes mellitus with complication, without long-term current use of insulin ([place]) 05/01/2015   Diabetic neuropathy ([place]) 05/01/2015   Essential hypertension, benign 11/01/2013   Lumbar disc herniation 09/13/2013   Unspecified constipation 09/13/2013   Diabetes mellitus due to underlying condition without complications ([place]) 25/07/3974   Chronic back pain 09/13/2013   Viral gastroenteritis 05/05/2013   Felon of finger of left hand with lymphangitis 12/13/2012    Hyperglycemia 07/29/2012   Angioedema of lips 07/29/2012   Crack cocaine use 07/29/2012   Arthritis 07/29/2012   Weakness generalized 07/29/2012    Past Surgical History:  Procedure Laterality Date   BACK SURGERY     HERNIA REPAIR Right 04/01/2012   I & D EXTREMITY Left 12/13/2012   Procedure: IRRIGATION AND DEBRIDEMENT LEFT THUMB;  Surgeon: Tennis Must, MD;  Location: [facility];  Service: Orthopedics;  Laterality: Left;   INCISION AND DRAINAGE OF WOUND     boil on back/notes 07/14/2008 (07/29/2012)   INGUINAL HERNIA REPAIR  04/01/2012   Procedure: HERNIA REPAIR INGUINAL ADULT;  Surgeon: Haywood Lasso, MD;  Location: [facility];  Service: General;  Laterality: Right;   INGUINAL HERNIA REPAIR Left 09/06/2016   Procedure: OPEN REPAIR LEFT INGUINAL HERNIA;  Surgeon: Greer Pickerel, MD;  Location: [place];  Service: General;  Laterality: Left;   INSERTION OF MESH Left 09/06/2016   Procedure: INSERTION OF MESH;  Surgeon: Greer Pickerel, MD;  Location: [place];  Service: General;  Laterality: Left;   TONSILLECTOMY         Family History  Problem Relation Age of Onset   Kidney disease Mother    Diabetes Father     Social History   Tobacco Use   Smoking status: Every Day    Packs/day: 0.25    Years: 30.00    Pack years: 7.50    Types: Cigarettes   Smokeless tobacco: Former    Quit date: 08/15/2015   Tobacco comments:  04/29/16 2  cigs daily, 10/27/17 sometimes < .25 PPD  Vaping Use   Vaping Use: Never used  Substance Use Topics   Alcohol use: No    Alcohol/week: 0.0 standard drinks   Drug use: Yes    Types: "Crack" cocaine    Home Medications Prior to Admission medications   Medication Sig Start Date End Date Taking? Authorizing Provider  Accu-Chek Softclix Lancets lancets Test blood sugar 2 times daily Patient taking differently: 1 each by Other route See admin instructions. Test blood sugar 2 times daily 11/03/20   Elsie Stain, MD  amLODipine (NORVASC) 10 MG tablet Take 1 tablet  (10 mg total) by mouth daily. 10/26/20   Elsie Stain, MD  atorvastatin (LIPITOR) 40 MG tablet Take 1 tablet (40 mg total) by mouth daily. 10/26/20   Elsie Stain, MD  Blood Glucose Monitoring Suppl (ACCU-CHEK GUIDE) w/Device KIT USE AS DIRECTED TO CHECK BLOOD SUGARS UP TO TWICE PER DAY Patient taking differently: 1 each by Other route 2 (two) times daily. 02/17/19   [name], MD  Blood Glucose Monitoring Suppl (ACCU-CHEK GUIDE) w/Device KIT Use as directed Patient taking differently: 1 each by Other route 2 (two) times daily. 11/03/20   Elsie Stain, MD  cyclobenzaprine (FLEXERIL) 10 MG tablet Take 1 tablet (10 mg total) by mouth 2 (two) times daily as needed for muscle spasms. 12/26/20   [name], MD  DULoxetine (CYMBALTA) 60 MG capsule Take 1 capsule (60 mg total) by mouth daily. 10/26/20   Elsie Stain, MD  empagliflozin (JARDIANCE) 10 MG TABS tablet Take 1 tablet (10 mg total) by mouth daily before breakfast. 10/26/20   Elsie Stain, MD  fluticasone [facility]) 50 MCG/ACT nasal spray Place 2 sprays into both nostrils daily. 11/03/19   Argentina Donovan, PA-C  gabapentin (NEURONTIN) 300 MG capsule Take 2 capsules (600 mg total) by mouth 3 (three) times daily. 11/01/20   Elsie Stain, MD  glipiZIDE (GLUCOTROL XL) 10 MG 24 hr tablet Take 1 tablet (10 mg total) by mouth daily with breakfast. 10/26/20   Elsie Stain, MD  glucose blood test strip Check blood sugar 2 times daily Patient taking differently: 1 each by Other route 2 (two) times daily. 11/03/20   Elsie Stain, MD  Insulin Pen Needle 32G X 4 MM MISC Use with Victoza Patient taking differently: 1 each by Other route See admin instructions. Use with Victoza 11/03/20   Elsie Stain, MD  liraglutide (VICTOZA) 18 MG/3ML SOPN Inject 0.6 mg once daily Patient not taking: Reported on 12/30/2020 12/26/20   [name], MD  metFORMIN (GLUCOPHAGE) 1000 MG tablet Take 1 tablet (1,000 mg total) by  mouth 2 (two) times daily. 12/26/20   [name], MD  naproxen (NAPROSYN) 375 MG tablet Take 1 tablet (375 mg total) by mouth 2 (two) times daily. 12/18/20   Blanchie Dessert, MD  loratadine (CLARITIN) 10 MG tablet Take 1 tablet (10 mg total) by mouth daily. Patient not taking: No sig reported 06/03/17 07/20/20  [name], MD    Allergies    Lisinopril  Review of Systems   Review of Systems  Cardiovascular:  Negative for chest pain.  Gastrointestinal:  Negative for abdominal pain.  Genitourinary:  Positive for flank pain.  All other systems reviewed and are negative.  Physical Exam Updated Vital Signs BP (!) 175/124 (BP Location: Left Arm)   Pulse 67   Temp 98.1 F (36.7 C) (Oral)  Resp 16   SpO2 100%   Physical Exam Vitals and nursing note reviewed.  Constitutional:      General: He is not in acute distress.    Appearance: Normal appearance. He is well-developed.  HENT:     Head: Normocephalic and atraumatic.  Eyes:     Conjunctiva/sclera: Conjunctivae normal.     Pupils: Pupils are equal, round, and reactive to light.  Cardiovascular:     Rate and Rhythm: Normal rate and regular rhythm.     Heart sounds: Normal heart sounds.  Pulmonary:     Effort: Pulmonary effort is normal. No respiratory distress.     Breath sounds: Normal breath sounds.  Abdominal:     General: There is no distension.     Palpations: Abdomen is soft.     Tenderness: There is no abdominal tenderness.  Genitourinary:    Comments: Mild right CVAT  Musculoskeletal:        General: No deformity. Normal range of motion.     Cervical back: Normal range of motion and neck supple.  Skin:    General: Skin is warm and dry.  Neurological:     General: No focal deficit present.     Mental Status: He is alert and oriented to person, place, and time.    ED Results / Procedures / Treatments   Labs (all labs ordered are listed, but only abnormal results are displayed) Labs Reviewed   URINALYSIS, ROUTINE W REFLEX MICROSCOPIC - Abnormal; Notable for the following components:      Result Value   Color, Urine STRAW (*)    Glucose, UA >=500 (*)    Hgb urine dipstick MODERATE (*)    Ketones, ur 5 (*)    All other components within normal limits  CBC WITH DIFFERENTIAL/PLATELET - Abnormal; Notable for the following components:   Hemoglobin 17.4 (*)    All other components within normal limits  RAPID URINE DRUG SCREEN, HOSP PERFORMED - Abnormal; Notable for the following components:   Cocaine POSITIVE (*)    All other components within normal limits  CBG MONITORING, ED - Abnormal; Notable for the following components:   Glucose-Capillary 405 (*)    All other components within normal limits  ETHANOL  COMPREHENSIVE METABOLIC PANEL  LIPASE, BLOOD  CBG MONITORING, ED    EKG None  Radiology DG Lumbar Spine Complete  Result Date: 02/17/2021 CLINICAL DATA:  60 year old male with acute on chronic low back pain radiating down the right lower extremity. EXAM: LUMBAR SPINE - COMPLETE 4+ VIEW COMPARISON:  Lumbar radiographs 09/22/2013 and earlier. Lumbar MRI 12/21/2017. FINDINGS: Normal lumbar segmentation. Since 2015 posterior and interbody fusion at L4-L5, present on the 2019 MRI. Hardware appears intact. Posterior decompression at that level. Mild straightening of lordosis since 2015. Moderate lumbar disc space loss at L3-L4 and mild to moderate facet hypertrophy at that level since 2015. No acute osseous abnormality identified. Osteopenia. Negative visible chest and abdominal visceral contours. IMPRESSION: 1. 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 2. No acute osseous abnormality identified. Electronically Signed   By: Genevie Ann M.D.   On: 02/17/2021 07:22   CT Renal Stone Study  Result Date: 02/17/2021 CLINICAL DATA:  Flank pain, kidney stone suspected; right flank pain EXAM: CT ABDOMEN AND PELVIS WITHOUT CONTRAST  TECHNIQUE: Multidetector CT imaging of the abdomen and pelvis was performed following the standard protocol without IV contrast. COMPARISON:  April 2018 FINDINGS: Lower chest: No acute abnormality.  Hepatobiliary: No focal liver abnormality is seen. No gallstones, gallbladder wall thickening, or biliary dilatation. Pancreas: Unremarkable. Spleen: Unremarkable. Adrenals/Urinary Tract: Adrenals are unremarkable. 4 mm periphery calcified lesion of the interpolar left kidney may reflect a stone or calcification associated with a cyst. Bilateral 2 mm nonobstructing renal calculi. There is right hydronephrosis. An obstructing 6 mm calculus is present within the proximal right ureter. Bladder is adequately distended for evaluation and is unremarkable. Stomach/Bowel: Stomach is within normal limits. Bowel is normal in caliber. Vascular/Lymphatic: Aortic atherosclerosis. No enlarged lymph nodes identified. Reproductive: Unremarkable. Other: No ascites.  Abdominal wall is unremarkable. Musculoskeletal: Postoperative changes of anterior and posterior fusion at L4-L5. No acute osseous abnormality. IMPRESSION: Obstructing 6 mm calculus of the proximal right ureter. Mild right hydronephrosis. Small bilateral nonobstructing renal calculi. Electronically Signed   By: Macy Mis M.D.   On: 02/17/2021 13:51    Procedures Procedures   Medications Ordered in ED Medications  morphine 4 MG/ML injection 4 mg (has no administration in time range)  ketorolac (TORADOL) 15 MG/ML injection 15 mg (has no administration in time range)  sodium chloride 0.9 % bolus 1,000 mL (1,000 mLs Intravenous New Bag/Given 02/17/21 1335)    ED Course  I have reviewed the triage vital signs and the nursing notes.  Pertinent labs & imaging results that were available during my care of the patient were reviewed by me and considered in my medical decision making (see chart for details).    MDM Rules/Calculators/A&P                            MDM  MSE complete  David Wyatt was evaluated in Emergency Department on 02/17/2021 for the symptoms described in the history of present illness. He was evaluated in the context of the global COVID-19 pandemic, which necessitated consideration that the patient might be at risk for infection with the SARS-CoV-2 virus that causes COVID-19. Institutional protocols and algorithms that pertain to the evaluation of patients at risk for COVID-19 are in a state of rapid change based on information released by regulatory bodies including the CDC and federal and state organizations. These policies and algorithms were followed during the patient's care in the ED.   Patient presented with complaint of right flank pain.  Work-up demonstrates presence of 6 mm right ureteral stone with mild hydronephrosis.  Patient significantly improved after administration of IV fluids, Toradol, and morphine.  He now desires DC home.  He does understand need for close follow-up with urology.  Strict return precautions given and understood.   Final Clinical Impression(s) / ED Diagnoses Final diagnoses:  Renal colic    Rx / DC Orders ED Discharge Orders          Ordered    HYDROcodone-acetaminophen (NORCO/VICODIN) 5-325 MG tablet  Every 6 hours PRN        02/17/21 1536    ondansetron (ZOFRAN) 4 MG tablet  Every 6 hours        02/17/21 1536             Valarie Merino, MD 02/17/21 1538

## 2021-02-19 ENCOUNTER — Emergency Department (HOSPITAL_COMMUNITY)
Admission: EM | Admit: 2021-02-19 | Discharge: 2021-02-19 | Disposition: A | Discharge status: Home / Self Care | Payer: Medicaid Other | Attending: Emergency Medicine | Admitting: Emergency Medicine

## 2021-02-19 ENCOUNTER — Other Ambulatory Visit: Payer: Self-pay

## 2021-02-19 ENCOUNTER — Ambulatory Visit: Payer: Self-pay

## 2021-02-19 ENCOUNTER — Encounter (HOSPITAL_COMMUNITY): Payer: Self-pay

## 2021-02-19 DIAGNOSIS — E1165 Type 2 diabetes mellitus with hyperglycemia: Secondary | ICD-10-CM | POA: Insufficient documentation

## 2021-02-19 DIAGNOSIS — F17210 Nicotine dependence, cigarettes, uncomplicated: Secondary | ICD-10-CM | POA: Diagnosis not present

## 2021-02-19 DIAGNOSIS — Z794 Long term (current) use of insulin: Secondary | ICD-10-CM | POA: Diagnosis not present

## 2021-02-19 DIAGNOSIS — Z79899 Other long term (current) drug therapy: Secondary | ICD-10-CM | POA: Insufficient documentation

## 2021-02-19 DIAGNOSIS — Z7984 Long term (current) use of oral hypoglycemic drugs: Secondary | ICD-10-CM | POA: Diagnosis not present

## 2021-02-19 DIAGNOSIS — I10 Essential (primary) hypertension: Secondary | ICD-10-CM | POA: Insufficient documentation

## 2021-02-19 DIAGNOSIS — N200 Calculus of kidney: Secondary | ICD-10-CM | POA: Diagnosis not present

## 2021-02-19 DIAGNOSIS — E1140 Type 2 diabetes mellitus with diabetic neuropathy, unspecified: Secondary | ICD-10-CM | POA: Insufficient documentation

## 2021-02-19 DIAGNOSIS — N23 Unspecified renal colic: Secondary | ICD-10-CM

## 2021-02-19 LAB — URINALYSIS, ROUTINE W REFLEX MICROSCOPIC
Bilirubin Urine: NEGATIVE
Glucose, UA: >=500 mg/dL — AB
Ketones, ur: 80 mg/dL — AB
Leukocytes,Ua: NEGATIVE
Nitrite: NEGATIVE
Protein, ur: NEGATIVE mg/dL
Specific Gravity, Urine: 1.025 (ref 1.005–1.030)
pH: 5 (ref 5.0–8.0)

## 2021-02-19 LAB — CBC WITH DIFFERENTIAL/PLATELET
Abs Immature Granulocytes: 0.07 10*3/uL (ref 0.00–0.07)
Basophils Absolute: 0.1 10*3/uL (ref 0.0–0.1)
Basophils Relative: 0 %
Eosinophils Absolute: 0 10*3/uL (ref 0.0–0.5)
Eosinophils Relative: 0 %
HCT: 45.3 % (ref 39.0–52.0)
Hemoglobin: 15.7 g/dL (ref 13.0–17.0)
Immature Granulocytes: 1 %
Lymphocytes Relative: 9 %
Lymphs Abs: 1.2 10*3/uL (ref 0.7–4.0)
MCH: 31.5 pg (ref 26.0–34.0)
MCHC: 34.7 g/dL (ref 30.0–36.0)
MCV: 90.8 fL (ref 80.0–100.0)
Monocytes Absolute: 0.9 10*3/uL (ref 0.1–1.0)
Monocytes Relative: 7 %
Neutro Abs: 11 10*3/uL — ABNORMAL HIGH (ref 1.7–7.7)
Neutrophils Relative %: 83 %
Platelets: 358 10*3/uL (ref 150–400)
RBC: 4.99 MIL/uL (ref 4.22–5.81)
RDW: 12.3 % (ref 11.5–15.5)
WBC: 13.1 10*3/uL — ABNORMAL HIGH (ref 4.0–10.5)
nRBC: 0 % (ref 0.0–0.2)

## 2021-02-19 LAB — BASIC METABOLIC PANEL
Anion gap: 13 (ref 5–15)
BUN: 31 mg/dL — ABNORMAL HIGH (ref 8–23)
CO2: 21 mmol/L — ABNORMAL LOW (ref 22–32)
Calcium: 9.1 mg/dL (ref 8.9–10.3)
Chloride: 96 mmol/L — ABNORMAL LOW (ref 98–111)
Creatinine, Ser: 1.66 mg/dL — ABNORMAL HIGH (ref 0.61–1.24)
GFR, Estimated: 47 mL/min — ABNORMAL LOW (ref >60)
Glucose, Bld: 332 mg/dL — ABNORMAL HIGH (ref 70–99)
Potassium: 4.8 mmol/L (ref 3.5–5.1)
Sodium: 130 mmol/L — ABNORMAL LOW (ref 135–145)

## 2021-02-19 LAB — CBG MONITORING, ED
Glucose-Capillary: 374 mg/dL — ABNORMAL HIGH (ref 70–99)
Glucose-Capillary: 334 mg/dL — ABNORMAL HIGH (ref 70–99)
Glucose-Capillary: 183 mg/dL — ABNORMAL HIGH (ref 70–99)

## 2021-02-19 MED ORDER — HYDROMORPHONE HCL 1 MG/ML IJ SOLN
1.0000 mg | Freq: Once | INTRAMUSCULAR | Status: AC
  Administered 2021-02-19: 1 mg via INTRAVENOUS
  Filled 2021-02-19: qty 1

## 2021-02-19 MED ORDER — INSULIN ASPART 100 UNIT/ML IJ SOLN
10.0000 [IU] | Freq: Once | INTRAMUSCULAR | Status: AC
  Administered 2021-02-19: 10 [IU] via SUBCUTANEOUS
  Filled 2021-02-19: qty 0.1

## 2021-02-19 MED ORDER — LACTATED RINGERS IV SOLN: INTRAVENOUS | Status: DC

## 2021-02-19 MED ORDER — ONDANSETRON HCL 4 MG/2ML IJ SOLN
4.0000 mg | Freq: Once | INTRAMUSCULAR | Status: AC
  Administered 2021-02-19: 4 mg via INTRAVENOUS
  Filled 2021-02-19: qty 2

## 2021-02-19 NOTE — ED Triage Notes (Addendum)
Per EMS- patient was diagnosed with a kidney stone 2 days ago. Patient c/o right flank pain.   EMS gave Fentanyl 100 mg IV prior to arrival to the ED.   Patient states he has not had his Metformin in 5 days.

## 2021-02-19 NOTE — ED Provider Notes (Signed)
[facility] DEPT Provider Note   CSN: [id number] Arrival date & time: 02/19/21  0857     History Chief Complaint  Patient presents with   Flank Pain   Hyperglycemia    David Wyatt is a 60 y.o. male.  60 year old male who presents with increasing pain to his right flank area.  Diagnosed with 6 mm kidney stone yesterday.  Was prescribed analgesics which she did not get filled.  Continues to note colicky flank pain.  Denies any hematuria or fever.  Called EMS and was transported here      Past Medical History:  Diagnosis Date   Angioedema of lips 07/28/2012   left upper (07/29/2012)   Arthritis    hands and back   Chronic back pain    Cocaine abuse ([place])    Diabetic neuropathy (HCC)    High cholesterol    Hypertension    Neuropathy    Type II diabetes mellitus ([place])     Patient Active Problem List   Diagnosis Date Noted   Tobacco abuse 08/19/2018   Hyperlipidemia 04/24/2017   GERD (gastroesophageal reflux disease) 06/13/2015   Controlled type 2 diabetes mellitus with complication, without long-term current use of insulin ([place]) 05/01/2015   Diabetic neuropathy ([place]) 05/01/2015   Essential hypertension, benign 11/01/2013   Lumbar disc herniation 09/13/2013   Unspecified constipation 09/13/2013   Diabetes mellitus due to underlying condition without complications ([place]) 81/03/7508   Chronic back pain 09/13/2013   Viral gastroenteritis 05/05/2013   Felon of finger of left hand with lymphangitis 12/13/2012   Hyperglycemia 07/29/2012   Angioedema of lips 07/29/2012   Crack cocaine use 07/29/2012   Arthritis 07/29/2012   Weakness generalized 07/29/2012    Past Surgical History:  Procedure Laterality Date   BACK SURGERY     HERNIA REPAIR Right 04/01/2012   I & D EXTREMITY Left 12/13/2012   Procedure: IRRIGATION AND DEBRIDEMENT LEFT THUMB;  Surgeon: Tennis Must, MD;  Location: [facility];  Service: Orthopedics;  Laterality: Left;   INCISION  AND DRAINAGE OF WOUND     boil on back/notes 07/14/2008 (07/29/2012)   INGUINAL HERNIA REPAIR  04/01/2012   Procedure: HERNIA REPAIR INGUINAL ADULT;  Surgeon: Haywood Lasso, MD;  Location: [facility];  Service: General;  Laterality: Right;   INGUINAL HERNIA REPAIR Left 09/06/2016   Procedure: OPEN REPAIR LEFT INGUINAL HERNIA;  Surgeon: Greer Pickerel, MD;  Location: [facility];  Service: General;  Laterality: Left;   INSERTION OF MESH Left 09/06/2016   Procedure: INSERTION OF MESH;  Surgeon: Greer Pickerel, MD;  Location: [facility];  Service: General;  Laterality: Left;   TONSILLECTOMY         Family History  Problem Relation Age of Onset   Kidney disease Mother    Diabetes Father     Social History   Tobacco Use   Smoking status: Every Day    Packs/day: 0.25    Years: 30.00    Pack years: 7.50    Types: Cigarettes   Smokeless tobacco: Former    Quit date: 08/15/2015   Tobacco comments:    04/29/16 2  cigs daily, 10/27/17 sometimes < .25 PPD  Vaping Use   Vaping Use: Never used  Substance Use Topics   Alcohol use: No    Alcohol/week: 0.0 standard drinks   Drug use: Yes    Types: "Crack" cocaine    Home Medications Prior to Admission medications   Medication Sig Start Date End Date  Taking? Authorizing Provider  Accu-Chek Softclix Lancets lancets Test blood sugar 2 times daily Patient taking differently: 1 each by Other route See admin instructions. Test blood sugar 2 times daily 11/03/20   Elsie Stain, MD  amLODipine (NORVASC) 10 MG tablet Take 1 tablet (10 mg total) by mouth daily. 10/26/20   Elsie Stain, MD  atorvastatin (LIPITOR) 40 MG tablet Take 1 tablet (40 mg total) by mouth daily. 10/26/20   Elsie Stain, MD  Blood Glucose Monitoring Suppl (ACCU-CHEK GUIDE) w/Device KIT USE AS DIRECTED TO CHECK BLOOD SUGARS UP TO TWICE PER DAY Patient taking differently: 1 each by Other route 2 (two) times daily. 02/17/19   [name], MD  Blood Glucose Monitoring Suppl (ACCU-CHEK  GUIDE) w/Device KIT Use as directed Patient taking differently: 1 each by Other route 2 (two) times daily. 11/03/20   Elsie Stain, MD  cyclobenzaprine (FLEXERIL) 10 MG tablet Take 1 tablet (10 mg total) by mouth 2 (two) times daily as needed for muscle spasms. 12/26/20   [name], MD  DULoxetine (CYMBALTA) 60 MG capsule Take 1 capsule (60 mg total) by mouth daily. 10/26/20   Elsie Stain, MD  empagliflozin (JARDIANCE) 10 MG TABS tablet Take 1 tablet (10 mg total) by mouth daily before breakfast. 10/26/20   Elsie Stain, MD  fluticasone [facility]) 50 MCG/ACT nasal spray Place 2 sprays into both nostrils daily. 11/03/19   Argentina Donovan, PA-C  gabapentin (NEURONTIN) 300 MG capsule Take 2 capsules (600 mg total) by mouth 3 (three) times daily. 11/01/20   Elsie Stain, MD  glipiZIDE (GLUCOTROL XL) 10 MG 24 hr tablet Take 1 tablet (10 mg total) by mouth daily with breakfast. 10/26/20   Elsie Stain, MD  glucose blood test strip Check blood sugar 2 times daily Patient taking differently: 1 each by Other route 2 (two) times daily. 11/03/20   Elsie Stain, MD  HYDROcodone-acetaminophen (NORCO/VICODIN) 5-325 MG tablet Take 1 tablet by mouth every 6 (six) hours as needed. 02/17/21   Valarie Merino, MD  Insulin Pen Needle 32G X 4 MM MISC Use with Victoza Patient taking differently: 1 each by Other route See admin instructions. Use with Victoza 11/03/20   Elsie Stain, MD  liraglutide (VICTOZA) 18 MG/3ML SOPN Inject 0.6 mg once daily Patient not taking: Reported on 12/30/2020 12/26/20   [name], MD  metFORMIN (GLUCOPHAGE) 1000 MG tablet Take 1 tablet (1,000 mg total) by mouth 2 (two) times daily. 12/26/20   [name], MD  naproxen (NAPROSYN) 375 MG tablet Take 1 tablet (375 mg total) by mouth 2 (two) times daily. 12/18/20   Blanchie Dessert, MD  ondansetron (ZOFRAN) 4 MG tablet Take 1 tablet (4 mg total) by mouth every 6 (six) hours. 02/17/21   Valarie Merino, MD  loratadine (CLARITIN) 10 MG tablet Take 1 tablet (10 mg total) by mouth daily. Patient not taking: No sig reported 06/03/17 07/20/20  [name], MD    Allergies    Lisinopril  Review of Systems   Review of Systems  All other systems reviewed and are negative.  Physical Exam Updated Vital Signs BP (!) 156/106 (BP Location: Left Arm)   Pulse 71   Temp 97.9 F (36.6 C) (Oral)   Resp 18   Ht 1.727 m ($Remove'5\' 8"'[id number]$ )   Wt 68 kg   SpO2 99%   BMI 22.81 kg/m   Physical Exam Vitals and nursing note reviewed.  Constitutional:  General: He is not in acute distress.    Appearance: Normal appearance. He is well-developed. He is not toxic-appearing.  HENT:     Head: Normocephalic and atraumatic.  Eyes:     General: Lids are normal.     Conjunctiva/sclera: Conjunctivae normal.     Pupils: Pupils are equal, round, and reactive to light.  Neck:     Thyroid: No thyroid mass.     Trachea: No tracheal deviation.  Cardiovascular:     Rate and Rhythm: Normal rate and regular rhythm.     Heart sounds: Normal heart sounds. No murmur heard.   No gallop.  Pulmonary:     Effort: Pulmonary effort is normal. No respiratory distress.     Breath sounds: Normal breath sounds. No stridor. No decreased breath sounds, wheezing, rhonchi or rales.  Abdominal:     General: There is no distension.     Palpations: Abdomen is soft.     Tenderness: There is no abdominal tenderness. There is no rebound.  Musculoskeletal:        General: No tenderness. Normal range of motion.     Cervical back: Normal range of motion and neck supple.  Skin:    General: Skin is warm and dry.     Findings: No abrasion or rash.  Neurological:     Mental Status: He is alert and oriented to person, place, and time. Mental status is at baseline.     GCS: GCS eye subscore is 4. GCS verbal subscore is 5. GCS motor subscore is 6.     Cranial Nerves: No cranial nerve deficit.     Sensory: No sensory deficit.      Motor: Motor function is intact.  Psychiatric:        Attention and Perception: Attention normal.        Speech: Speech normal.        Behavior: Behavior normal.    ED Results / Procedures / Treatments   Labs (all labs ordered are listed, but only abnormal results are displayed) Labs Reviewed  CBG MONITORING, ED - Abnormal; Notable for the following components:      Result Value   Glucose-Capillary 374 (*)    All other components within normal limits  URINALYSIS, ROUTINE W REFLEX MICROSCOPIC  CBC WITH DIFFERENTIAL/PLATELET  BASIC METABOLIC PANEL    EKG None  Radiology CT Renal Stone Study  Result Date: 02/17/2021 CLINICAL DATA:  Flank pain, kidney stone suspected; right flank pain EXAM: CT ABDOMEN AND PELVIS WITHOUT CONTRAST TECHNIQUE: Multidetector CT imaging of the abdomen and pelvis was performed following the standard protocol without IV contrast. COMPARISON:  April 2018 FINDINGS: Lower chest: No acute abnormality. Hepatobiliary: No focal liver abnormality is seen. No gallstones, gallbladder wall thickening, or biliary dilatation. Pancreas: Unremarkable. Spleen: Unremarkable. Adrenals/Urinary Tract: Adrenals are unremarkable. 4 mm periphery calcified lesion of the interpolar left kidney may reflect a stone or calcification associated with a cyst. Bilateral 2 mm nonobstructing renal calculi. There is right hydronephrosis. An obstructing 6 mm calculus is present within the proximal right ureter. Bladder is adequately distended for evaluation and is unremarkable. Stomach/Bowel: Stomach is within normal limits. Bowel is normal in caliber. Vascular/Lymphatic: Aortic atherosclerosis. No enlarged lymph nodes identified. Reproductive: Unremarkable. Other: No ascites.  Abdominal wall is unremarkable. Musculoskeletal: Postoperative changes of anterior and posterior fusion at L4-L5. No acute osseous abnormality. IMPRESSION: Obstructing 6 mm calculus of the proximal right ureter. Mild right  hydronephrosis. Small bilateral nonobstructing renal calculi. Electronically Signed  By: Macy Mis M.D.   On: 02/17/2021 13:51    Procedures Procedures   Medications Ordered in ED Medications  lactated ringers infusion (has no administration in time range)  HYDROmorphone (DILAUDID) injection 1 mg (has no administration in time range)  ondansetron (ZOFRAN) injection 4 mg (has no administration in time range)    ED Course  I have reviewed the triage vital signs and the nursing notes.  Pertinent labs & imaging results that were available during my care of the patient were reviewed by me and considered in my medical decision making (see chart for details).    MDM Rules/Calculators/A&P                           Patient medicated for pain here as well as given treatment for his hyperglycemia.  Was also treated with IV fluids for his dehydration.  His anion gap is normal.  Patient informed that he needs to follow-up with urology as already instructed as well as take his medications as directed.  He was given orals here which he is able to take without emesis. Final Clinical Impression(s) / ED Diagnoses Final diagnoses:  None    Rx / DC Orders ED Discharge Orders     None        Lacretia Leigh, MD 02/19/21 1301

## 2021-02-19 NOTE — Discharge Instructions (Addendum)
Take your medications as directed

## 2021-02-19 NOTE — ED Notes (Signed)
EDP at the bedside to re-evaluate.

## 2021-02-19 NOTE — Progress Notes (Signed)
Inpatient Diabetes Program Recommendations  AACE/ADA: New Consensus Statement on Inpatient Glycemic Control (2015)  Target Ranges:  Prepandial:   less than 140 mg/dL      Peak postprandial:   less than 180 mg/dL (1-2 hours)      Critically ill patients:  140 - 180 mg/dL   Lab Results  Component Value Date   GLUCAP 183 (H) 02/19/2021   HGBA1C 11.5 (A) 12/26/2020    Review of Glycemic Control  Latest Reference Range & Units 02/19/21 10:54 02/19/21 13:11  Glucose-Capillary 70 - 99 mg/dL 334 (H) Novolog 10 units 183 (H)  (H): Data is abnormally high  Diabetes history: DM2 Outpatient Diabetes medications: Jardiance 10 mg (not taking), Glipizide 10 mg QD, Victoza (not taking), Metformin 1000 mg BID Current orders for Inpatient glycemic control: Novolog x1  Spoke with patient at bedside regarding DM management at home.  He lives alone.  He states he does not take Jardiance because he did not like the warnings on commercials.  He does not take Victoza.  He states he has short term memory problems and often forgets to take his medications.  He checks his blood sugar at home when he remembers. Discussed CHOs, importance of eliminating sugary beverages and The Plate Method.  He really does not have much understanding on DM management.  He has Medicaid; might benefit from social work visits.  Will continue to follow while inpatient.  Thank you, David Dixon, RN, BSN Diabetes Coordinator Inpatient Diabetes Program [phone] (team pager from 8a-5p)

## 2021-02-19 NOTE — ED Notes (Signed)
Pt ambulatory to the bathroom with standby assist.

## 2021-02-19 NOTE — Telephone Encounter (Signed)
Pt. States he was in ED for "kidney stone." Screaming he is in pain right side. Told pt. Because of his screaming he needs to return to ED for this level of pain. States "they put me in the waiting room. Just forget it." Pt. Disconnected the call. Was unable to fully triage pt.

## 2021-02-21 ENCOUNTER — Emergency Department (HOSPITAL_COMMUNITY)
Admission: EM | Admit: 2021-02-21 | Discharge: 2021-02-21 | Disposition: A | Discharge status: Home / Self Care | Payer: Medicaid Other | Attending: Emergency Medicine | Admitting: Emergency Medicine

## 2021-02-21 ENCOUNTER — Other Ambulatory Visit: Payer: Self-pay

## 2021-02-21 ENCOUNTER — Emergency Department (HOSPITAL_COMMUNITY): Payer: Medicaid Other

## 2021-02-21 ENCOUNTER — Ambulatory Visit: Payer: Self-pay

## 2021-02-21 DIAGNOSIS — E1165 Type 2 diabetes mellitus with hyperglycemia: Secondary | ICD-10-CM | POA: Diagnosis not present

## 2021-02-21 DIAGNOSIS — R739 Hyperglycemia, unspecified: Secondary | ICD-10-CM

## 2021-02-21 DIAGNOSIS — Z79899 Other long term (current) drug therapy: Secondary | ICD-10-CM | POA: Diagnosis not present

## 2021-02-21 DIAGNOSIS — E1140 Type 2 diabetes mellitus with diabetic neuropathy, unspecified: Secondary | ICD-10-CM | POA: Insufficient documentation

## 2021-02-21 DIAGNOSIS — Z7984 Long term (current) use of oral hypoglycemic drugs: Secondary | ICD-10-CM | POA: Insufficient documentation

## 2021-02-21 DIAGNOSIS — N201 Calculus of ureter: Secondary | ICD-10-CM | POA: Diagnosis not present

## 2021-02-21 DIAGNOSIS — I10 Essential (primary) hypertension: Secondary | ICD-10-CM | POA: Diagnosis not present

## 2021-02-21 DIAGNOSIS — Z794 Long term (current) use of insulin: Secondary | ICD-10-CM | POA: Diagnosis not present

## 2021-02-21 DIAGNOSIS — F17210 Nicotine dependence, cigarettes, uncomplicated: Secondary | ICD-10-CM | POA: Diagnosis not present

## 2021-02-21 DIAGNOSIS — R109 Unspecified abdominal pain: Secondary | ICD-10-CM | POA: Diagnosis present

## 2021-02-21 LAB — CBC WITH DIFFERENTIAL/PLATELET
Abs Immature Granulocytes: 0.03 10*3/uL (ref 0.00–0.07)
Basophils Absolute: 0.1 10*3/uL (ref 0.0–0.1)
Basophils Relative: 1 %
Eosinophils Absolute: 0.1 10*3/uL (ref 0.0–0.5)
Eosinophils Relative: 1 %
HCT: 46.4 % (ref 39.0–52.0)
Hemoglobin: 15.8 g/dL (ref 13.0–17.0)
Immature Granulocytes: 1 %
Lymphocytes Relative: 24 %
Lymphs Abs: 1.3 10*3/uL (ref 0.7–4.0)
MCH: 31.4 pg (ref 26.0–34.0)
MCHC: 34.1 g/dL (ref 30.0–36.0)
MCV: 92.2 fL (ref 80.0–100.0)
Monocytes Absolute: 0.3 10*3/uL (ref 0.1–1.0)
Monocytes Relative: 6 %
Neutro Abs: 3.8 10*3/uL (ref 1.7–7.7)
Neutrophils Relative %: 67 %
Platelets: 404 10*3/uL — ABNORMAL HIGH (ref 150–400)
RBC: 5.03 MIL/uL (ref 4.22–5.81)
RDW: 12.3 % (ref 11.5–15.5)
WBC: 5.6 10*3/uL (ref 4.0–10.5)
nRBC: 0 % (ref 0.0–0.2)

## 2021-02-21 LAB — URINALYSIS, ROUTINE W REFLEX MICROSCOPIC
Bacteria, UA: NONE SEEN
Bilirubin Urine: NEGATIVE
Glucose, UA: >=500 mg/dL — AB
Ketones, ur: 15 mg/dL — AB
Leukocytes,Ua: NEGATIVE
Nitrite: NEGATIVE
Protein, ur: NEGATIVE mg/dL
Specific Gravity, Urine: 1.015 (ref 1.005–1.030)
pH: 5.5 (ref 5.0–8.0)

## 2021-02-21 LAB — CBG MONITORING, ED
Glucose-Capillary: 520 mg/dL (ref 70–99)
Glucose-Capillary: 495 mg/dL — ABNORMAL HIGH (ref 70–99)
Glucose-Capillary: 270 mg/dL — ABNORMAL HIGH (ref 70–99)

## 2021-02-21 LAB — BASIC METABOLIC PANEL
Anion gap: 12 (ref 5–15)
BUN: 27 mg/dL — ABNORMAL HIGH (ref 8–23)
CO2: 23 mmol/L (ref 22–32)
Calcium: 9.2 mg/dL (ref 8.9–10.3)
Chloride: 96 mmol/L — ABNORMAL LOW (ref 98–111)
Creatinine, Ser: 1.14 mg/dL (ref 0.61–1.24)
GFR, Estimated: >60 mL/min (ref >60)
Glucose, Bld: 349 mg/dL — ABNORMAL HIGH (ref 70–99)
Potassium: 4.7 mmol/L (ref 3.5–5.1)
Sodium: 131 mmol/L — ABNORMAL LOW (ref 135–145)

## 2021-02-21 MED ORDER — INSULIN ASPART 100 UNIT/ML IJ SOLN
10.0000 [IU] | Freq: Once | INTRAMUSCULAR | Status: AC
  Administered 2021-02-21: 22:00:00 10 [IU] via SUBCUTANEOUS
  Filled 2021-02-21: qty 0.1

## 2021-02-21 MED ORDER — SODIUM CHLORIDE 0.9 % IV BOLUS
1000.0000 mL | Freq: Once | INTRAVENOUS | Status: AC
  Administered 2021-02-21: 20:00:00 1000 mL via INTRAVENOUS

## 2021-02-21 MED ORDER — ONDANSETRON HCL 4 MG/2ML IJ SOLN
4.0000 mg | Freq: Once | INTRAMUSCULAR | Status: AC
  Administered 2021-02-21: 20:00:00 4 mg via INTRAVENOUS
  Filled 2021-02-21: qty 2

## 2021-02-21 MED ORDER — MORPHINE SULFATE (PF) 4 MG/ML IV SOLN
4.0000 mg | Freq: Once | INTRAVENOUS | Status: AC
  Administered 2021-02-21: 20:00:00 4 mg via INTRAVENOUS
  Filled 2021-02-21: qty 1

## 2021-02-21 MED ORDER — OXYCODONE-ACETAMINOPHEN 5-325 MG PO TABS
1.0000 | ORAL_TABLET | Freq: Once | ORAL | Status: AC
  Administered 2021-02-21: 13:00:00 1 via ORAL
  Filled 2021-02-21: qty 1

## 2021-02-21 NOTE — Telephone Encounter (Signed)
This encounter was created in error - please disregard.

## 2021-02-21 NOTE — ED Triage Notes (Signed)
Patient BIBA from hotel c/o right flank pain. Was d/c recently for same complaint after dx with kidney stone. Hx DM II, HTN, cocaine abuse. Prescribed oxycodone  for flank pain, has not F/U with urology.   BP 150/74 HR 60 98% RA  20 g LAC  100 mcg fentanyl given

## 2021-02-21 NOTE — Discharge Instructions (Addendum)
Please call the urology office listed on your paperwork first thing tomorrow morning they would like to see you in the office tomorrow.  In the meantime use the pain medication you were prescribed 2 days ago to help continue to control your pain.  Please continue taking your diabetes medications as prescribed to help better control your blood sugars.  Follow-up with your primary care doctor for continued management of your diabetes.

## 2021-02-21 NOTE — ED Notes (Signed)
Patient upset about getting discharged. Pt stated I dont know how to go home , I don't have a ride. RN stated I can give you bus pass so you can use it in the morning. Pt stated Im not gonna wait in the lobby until in the morning. RN escorted patient out in the ED. Pt refused ED Discharge signed.

## 2021-02-21 NOTE — Telephone Encounter (Signed)
°  Chief Complaint: side pain Symptoms: L side pain and back pain 10/10 Frequency: 1 week Pertinent Negatives: Patient denies pain or burning with urination Disposition: [] ED /[] Urgent Care (no appt availability in office) / [] Appointment(In office/virtual)/ []  [facility]/ [] Home Care/ [] Refused Recommended Disposition  Additional Notes: Pt called in stating he is having L sided pain. Went to ED on 02/19/21. Pt was agitated and cussing on the phone. I was asking pt about ED visit and pt states "I'm going to call the hospital" and starts cussing again and disconnected call. Care advice was not given.     Answer Assessment - Initial Assessment Questions 1. LOCATION: "Where does it hurt?" (e.g., left, right)     L sided pain 2. ONSET: "When did the pain start?"     1 week ago  3. SEVERITY: "How bad is the pain?" (e.g., Scale 1-10; mild, moderate, or severe)   - MILD (1-3): doesn't interfere with normal activities    - MODERATE (4-7): interferes with normal activities or awakens from sleep    - SEVERE (8-10): excruciating pain and patient unable to do normal activities (stays in bed)       10 4. PATTERN: "Does the pain come and go, or is it constant?"      constant 5. CAUSE: "What do you think is causing the pain?"     unsure 6. OTHER SYMPTOMS:  "Do you have any other symptoms?" (e.g., fever, abdominal pain, vomiting, leg weakness, burning with urination, blood in urine)     Back pain  Protocols used: Flank Pain-A-AH

## 2021-02-21 NOTE — ED Provider Notes (Signed)
Emergency Medicine Provider Triage Evaluation Note  David Wyatt , a 60 y.o. male  was evaluated in triage.  Pt complains of ongoing right flank pain despite taking oxycodone for known 71mm stone in right ureter. Third visit for same issue. Given 171mcg in route. Hx htn, dm2, cocaine abuse. Denies nausea, vomiting at this time, but 10/10 pain. Denies hematuria.  Review of Systems  Positive: As above Negative: As above  Physical Exam  BP (!) 147/115 (BP Location: Right Arm)    Pulse (!) 52    Temp (!) 97.5 F (36.4 C) (Oral)    Resp 16    SpO2 99%  Gen:   Awake, distress Resp:  Normal effort  MSK:   Moves extremities without difficulty  Other:  Right CVA tenderness  Medical Decision Making  Medically screening exam initiated at 12:57 PM.  Appropriate orders placed.  David Wyatt was informed that the remainder of the evaluation will be completed by another provider, this initial triage assessment does not replace that evaluation, and the importance of remaining in the ED until their evaluation is complete.  Known kidney stone, ongoing pain, failure of passage   David Wyatt 02/21/21 1259    David Ferguson, MD 02/21/21 1623

## 2021-02-21 NOTE — ED Provider Notes (Signed)
David Wyatt   CSN: [id number] Arrival date & time: 02/21/21  1210     History Chief Complaint  Patient presents with   Flank Pain    David Wyatt is a 60 y.o. male.  David Wyatt is a 60 y.o. male with a history of hypertension, hyperlipidemia, diabetes, cocaine abuse, who returns to the emergency department with continued right flank pain. This is his 3rd visit in the ED this week. Diagnosed with 6 mm right proximal ureteral stone with mild hydronephrosis on 12/10. States he continues to have pain on his right abdomen and right side of his lower back despite taking oxycodone. Denies fevers, chills, urinary symptoms, or gross hematuria. Also reports feeling constipated for the last few days.  He has been given information for urology follow-up at his last 2 visits but has not called to schedule follow-up appointment.  The history is provided by the patient and medical records.      Past Medical History:  Diagnosis Date   Angioedema of lips 07/28/2012   left upper (07/29/2012)   Arthritis    hands and back   Chronic back pain    Cocaine abuse ([place])    Diabetic neuropathy ([facility]CC)    High cholesterol    Hypertension    Neuropathy    Type II diabetes mellitus ([place])     Patient Active Problem List   Diagnosis Date Noted   Tobacco abuse 08/19/2018   Hyperlipidemia 04/24/2017   GERD (gastroesophageal reflux disease) 06/13/2015   Controlled type 2 diabetes mellitus with complication, without long-term current use of insulin ([place]) 05/01/2015   Diabetic neuropathy ([place]) 05/01/2015   Essential hypertension, benign 11/01/2013   Lumbar disc herniation 09/13/2013   Unspecified constipation 09/13/2013   Diabetes mellitus due to underlying condition without complications ([place]) 45/80/9983   Chronic back pain 09/13/2013   Viral gastroenteritis 05/05/2013   Felon of finger of left hand with lymphangitis 12/13/2012   Hyperglycemia  07/29/2012   Angioedema of lips 07/29/2012   Crack cocaine use 07/29/2012   Arthritis 07/29/2012   Weakness generalized 07/29/2012    Past Surgical History:  Procedure Laterality Date   BACK SURGERY     HERNIA REPAIR Right 04/01/2012   I & D EXTREMITY Left 12/13/2012   Procedure: IRRIGATION AND DEBRIDEMENT LEFT THUMB;  Surgeon: Tennis Must, MD;  Location: [facility];  Service: Orthopedics;  Laterality: Left;   INCISION AND DRAINAGE OF WOUND     boil on back/notes 07/14/2008 (07/29/2012)   INGUINAL HERNIA REPAIR  04/01/2012   Procedure: HERNIA REPAIR INGUINAL ADULT;  Surgeon: Haywood Lasso, MD;  Location: [facility];  Service: General;  Laterality: Right;   INGUINAL HERNIA REPAIR Left 09/06/2016   Procedure: OPEN REPAIR LEFT INGUINAL HERNIA;  Surgeon: Greer Pickerel, MD;  Location: [facility];  Service: General;  Laterality: Left;   INSERTION OF MESH Left 09/06/2016   Procedure: INSERTION OF MESH;  Surgeon: Greer Pickerel, MD;  Location: [facility];  Service: General;  Laterality: Left;   TONSILLECTOMY         Family History  Problem Relation Age of Onset   Kidney disease Mother    Diabetes Father     Social History   Tobacco Use   Smoking status: Every Day    Packs/day: 0.25    Years: 30.00    Pack years: 7.50    Types: Cigarettes   Smokeless tobacco: Former    Quit date: 08/15/2015   Tobacco  comments:    04/29/16 2  cigs daily, 10/27/17 sometimes < .25 PPD  Vaping Use   Vaping Use: Never used  Substance Use Topics   Alcohol use: No    Alcohol/week: 0.0 standard drinks   Drug use: Yes    Types: "Crack" cocaine    Home Medications Prior to Admission medications   Medication Sig Start Date End Date Taking? Authorizing Provider  Accu-Chek Softclix Lancets lancets Test blood sugar 2 times daily Patient taking differently: 1 each by Other route See admin instructions. Test blood sugar 2 times daily 11/03/20   Elsie Stain, MD  amLODipine (NORVASC) 10 MG tablet Take 1 tablet (10 mg total)  by mouth daily. 10/26/20   Elsie Stain, MD  atorvastatin (LIPITOR) 40 MG tablet Take 1 tablet (40 mg total) by mouth daily. 10/26/20   Elsie Stain, MD  Blood Glucose Monitoring Suppl (ACCU-CHEK GUIDE) w/Device KIT USE AS DIRECTED TO CHECK BLOOD SUGARS UP TO TWICE PER DAY Patient taking differently: 1 each by Other route 2 (two) times daily. 02/17/19   [name], MD  Blood Glucose Monitoring Suppl (ACCU-CHEK GUIDE) w/Device KIT Use as directed Patient taking differently: 1 each by Other route 2 (two) times daily. 11/03/20   Elsie Stain, MD  cyclobenzaprine (FLEXERIL) 10 MG tablet Take 1 tablet (10 mg total) by mouth 2 (two) times daily as needed for muscle spasms. 12/26/20   [name], MD  DULoxetine (CYMBALTA) 60 MG capsule Take 1 capsule (60 mg total) by mouth daily. 10/26/20   Elsie Stain, MD  empagliflozin (JARDIANCE) 10 MG TABS tablet Take 1 tablet (10 mg total) by mouth daily before breakfast. 10/26/20   Elsie Stain, MD  fluticasone [facility]) 50 MCG/ACT nasal spray Place 2 sprays into both nostrils daily. 11/03/19   Argentina Donovan, PA-C  gabapentin (NEURONTIN) 300 MG capsule Take 2 capsules (600 mg total) by mouth 3 (three) times daily. 11/01/20   Elsie Stain, MD  glipiZIDE (GLUCOTROL XL) 10 MG 24 hr tablet Take 1 tablet (10 mg total) by mouth daily with breakfast. 10/26/20   Elsie Stain, MD  glucose blood test strip Check blood sugar 2 times daily Patient taking differently: 1 each by Other route 2 (two) times daily. 11/03/20   Elsie Stain, MD  HYDROcodone-acetaminophen (NORCO/VICODIN) 5-325 MG tablet Take 1 tablet by mouth every 6 (six) hours as needed. 02/17/21   Valarie Merino, MD  Insulin Pen Needle 32G X 4 MM MISC Use with Victoza Patient taking differently: 1 each by Other route See admin instructions. Use with Victoza 11/03/20   Elsie Stain, MD  liraglutide (VICTOZA) 18 MG/3ML SOPN Inject 0.6 mg once daily Patient not taking:  Reported on 12/30/2020 12/26/20   [name], MD  metFORMIN (GLUCOPHAGE) 1000 MG tablet Take 1 tablet (1,000 mg total) by mouth 2 (two) times daily. 12/26/20   [name], MD  naproxen (NAPROSYN) 375 MG tablet Take 1 tablet (375 mg total) by mouth 2 (two) times daily. 12/18/20   Blanchie Dessert, MD  ondansetron (ZOFRAN) 4 MG tablet Take 1 tablet (4 mg total) by mouth every 6 (six) hours. 02/17/21   Valarie Merino, MD  loratadine (CLARITIN) 10 MG tablet Take 1 tablet (10 mg total) by mouth daily. Patient not taking: No sig reported 06/03/17 07/20/20  [name], MD    Allergies    Lisinopril  Review of Systems   Review of Systems  Constitutional:  Negative for chills and fever.  HENT: Negative.    Respiratory:  Negative for cough and shortness of breath.   Cardiovascular:  Negative for chest pain.  Gastrointestinal:  Positive for abdominal pain and constipation. Negative for nausea and vomiting.  Genitourinary:  Positive for flank pain. Negative for dysuria, frequency and hematuria.  Musculoskeletal:  Negative for arthralgias and myalgias.  Skin:  Negative for color change and rash.  Neurological:  Negative for dizziness, syncope and light-headedness.  All other systems reviewed and are negative.  Physical Exam Updated Vital Signs BP (!) 140/102    Pulse 75    Temp (!) 97.5 F (36.4 C) (Oral)    Resp 18    SpO2 95%   Physical Exam Vitals and nursing Wyatt reviewed.  Constitutional:      General: He is not in acute distress.    Appearance: Normal appearance. He is well-developed and normal weight. He is not ill-appearing or diaphoretic.  HENT:     Head: Normocephalic and atraumatic.     Mouth/Throat:     Mouth: Mucous membranes are moist.     Pharynx: Oropharynx is clear.  Eyes:     General:        Right eye: No discharge.        Left eye: No discharge.  Cardiovascular:     Rate and Rhythm: Normal rate and regular rhythm.     Pulses: Normal pulses.      Heart sounds: Normal heart sounds. No murmur heard.   No friction rub. No gallop.  Pulmonary:     Effort: Pulmonary effort is normal. No respiratory distress.     Breath sounds: Normal breath sounds. No wheezing or rales.     Comments: Respirations equal and unlabored, patient able to speak in full sentences, lungs clear to auscultation bilaterally  Abdominal:     General: Bowel sounds are normal. There is no distension.     Palpations: Abdomen is soft. There is no mass.     Tenderness: There is abdominal tenderness. There is right CVA tenderness. There is no guarding.     Comments: Abdomen is soft, nondistended, bowel sounds present throughout.  Patient has diffuse tenderness throughout the right side of the abdomen with voluntary guarding as well as tenderness over the right flank but with distraction does not exhibit guarding or significant pain.+  Musculoskeletal:        General: No deformity.     Cervical back: Neck supple.  Skin:    General: Skin is warm and dry.     Capillary Refill: Capillary refill takes less than 2 seconds.  Neurological:     Mental Status: He is alert and oriented to person, place, and time.     Coordination: Coordination normal.     Comments: Speech is clear, able to follow commands Moves extremities without ataxia, coordination intact  Psychiatric:        Mood and Affect: Mood normal.        Behavior: Behavior normal.    ED Results / Procedures / Treatments   Labs (all labs ordered are listed, but only abnormal results are displayed) Labs Reviewed  CBC WITH DIFFERENTIAL/PLATELET - Abnormal; Notable for the following components:      Result Value   Platelets 404 (*)    All other components within normal limits  BASIC METABOLIC PANEL - Abnormal; Notable for the following components:   Sodium 131 (*)    Chloride 96 (*)    Glucose, Bld  349 (*)    BUN 27 (*)    All other components within normal limits  URINALYSIS, ROUTINE W REFLEX MICROSCOPIC -  Abnormal; Notable for the following components:   Glucose, UA >=500 (*)    Hgb urine dipstick MODERATE (*)    Ketones, ur 15 (*)    All other components within normal limits  CBG MONITORING, ED - Abnormal; Notable for the following components:   Glucose-Capillary 520 (*)    All other components within normal limits  CBG MONITORING, ED - Abnormal; Notable for the following components:   Glucose-Capillary 495 (*)    All other components within normal limits  CBG MONITORING, ED - Abnormal; Notable for the following components:   Glucose-Capillary 270 (*)    All other components within normal limits    EKG None  Radiology DG Abdomen 1 View  Result Date: 02/21/2021 CLINICAL DATA:  Right-sided flank pain and constipation EXAM: ABDOMEN - 1 VIEW COMPARISON:  02/17/2021 FINDINGS: The amount of gas and stool within the colon is within the range of normal. Small bowel pattern is normal. Small bilateral renal calculi remain visible. Previous lumbosacral discectomy and fusion. IMPRESSION: Gas pattern within normal limits presently. Small renal calculi. Electronically Signed   By: Nelson Chimes M.D.   On: 02/21/2021 13:13   CT Renal Stone Study  Result Date: 02/21/2021 CLINICAL DATA:  Right flank pain with recent CT showing a right ureteral stone. EXAM: CT ABDOMEN AND PELVIS WITHOUT CONTRAST TECHNIQUE: Multidetector CT imaging of the abdomen and pelvis was performed following the standard protocol without IV contrast. COMPARISON:  Recent CT no contrast 02/17/2021, older CT with contrast 06/16/2016 FINDINGS: Lower chest: There are few linear scar-like opacities in the lung bases without infiltrates. The cardiac size is normal. Hepatobiliary: Unremarkable noncontrast appearance of the liver, gallbladder bile ducts. Pancreas: Not adequately visualized due to a general paucity of body fat. No obvious mass or ductal dilatation. Spleen: Unremarkable without contrast.  No splenomegaly. Adrenals/Urinary Tract:  Stable 2.1 cm left adrenal nodule. No right adrenal lesions. Tandem proximal right ureteral stones are again noted measuring 5 mm and 3 mm, largest one on top of the smaller stone, both now at the level of the L4 superior endplate previously at the level the L3 lower plate having moved inferiorly no more than 1 cm. There is a 4 mm nonobstructive caliceal stone in the right lower pole, 3 mm nonobstructing stone in the left lower pole collecting system. A small cortical calcification in the outer left kidney is again shown. There is a 2.4 cm mass of the lower anterior left kidney of 33 Hounsfield units above the usual density of simple fluid, in 2018 was 1.9 cm. Ultrasound or MRI recommended to assess for internal complexity. The unenhanced renal cortex is otherwise unremarkable. No significant bladder thickening is seen. Stomach/Bowel: Not well evaluated due to lack of contrast and minimal body fat. The appendix is visible and unremarkable, no dilated or frankly thickened small bowel or stomach lining is seen. There is moderate to severe fecal stasis. There are colonic diverticula without wall thickening or adjacent inflammation. Vascular/Lymphatic: Aortic atherosclerosis. No enlarged abdominal or pelvic lymph nodes. Please Wyatt, assessment for adenopathy limited due to unopacified bowel and minimal body fat. Reproductive: The prostate is slightly enlarged. There are calcifications in the central prostate. Mild impression on the posterior bladder. Pelvic phleboliths. Other: Trace ascites in the posterior deep pelvis. No free air, hemorrhage or abscess , no ventral hernia. Musculoskeletal: Old fusion  hardware, laminectomy and interbody disc space apparatus L4-5. Degenerative changes lumbar spine mild osteopenia. Bone graft harvest site chronically noted posterior left ilium. IMPRESSION: 1. Tandem 5 mm and 3 mm proximal right ureteral stones at the level of the superior endplate of L4, only about 1 cm more inferior to  the position of the stones 4 days ago. 2. Nonobstructive nephrolithiasis. 3. 2.3 cm indeterminate low-density lesion, slightly larger than in 2018, low anterior left kidney. Most likely this is a hyperdense cyst but ultrasound or MRI follow-up recommended to assess for internal complexity. 4. Moderate to severe fecal stasis, with uncomplicated colonic diverticula. 5. Trace ascites. 6. Slightly prominent prostate. Electronically Signed   By: Telford Nab M.D.   On: 02/21/2021 20:12    Procedures Procedures   Medications Ordered in ED Medications  oxyCODONE-acetaminophen (PERCOCET/ROXICET) 5-325 MG per tablet 1 tablet (1 tablet Oral Given 02/21/21 1308)  sodium chloride 0.9 % bolus 1,000 mL (0 mLs Intravenous Stopped 02/21/21 2142)  ondansetron (ZOFRAN) injection 4 mg (4 mg Intravenous Given 02/21/21 2025)  morphine 4 MG/ML injection 4 mg (4 mg Intravenous Given 02/21/21 2025)  insulin aspart (novoLOG) injection 10 Units (10 Units Subcutaneous Given 02/21/21 2202)    ED Course  I have reviewed the triage vital signs and the nursing notes.  Pertinent labs & imaging results that were available during my care of the patient were reviewed by me and considered in my medical decision making (see chart for details).    MDM Rules/Calculators/A&P                           Patient presents to the ED with complaints of  continued flank/abdominal pain. Patient nontoxic appearing, vitals without significant abnormalities. On exam patient is tender over the right side of the abdomen and right flank with voluntary guarding, pain seems to be improved with distraction., no peritoneal signs.  Patient was diagnosed with a 6 mm right proximal ureteral stone on 12/10, this is his third visit to the ED since initial diagnosis reporting continued pain.  He has not called to schedule follow-up with urology.  Given persistent pain will repeat labs and CT renal study, analgesics, anti-emetics, and fluids ordered.    No leukocytosis, anemia, or significant electrolyte derangements.  CT renal study with tandem 5 mm and 3 mm proximal right ureteral stones, only about 1 cm more inferior than the position of stones 4 days ago.  Bilateral nonobstructive nephrolithiasis.  2.3 cm indeterminate low-density lesion slightly larger than 2018 in the low anterior left kidney, likely a hyperdense cyst.  Evidence of constipation is also noted. Renal function preserved. Urinalysis without appearance of superimposed infection.   Patient also noted to be hyperglycemic with blood sugar in the 400s, with IV fluids and subcu insulin blood sugar improved to 270.  Given very little movement of stones and continued pain will discuss with urology.  Case discussed with Dr. Wendy Poet, given that patient's pain is currently well controlled he recommends discharge and that the patient call for same-day follow-up in the office tomorrow for further evaluation.  Patient tolerating PO in the ER with pain well controlled. Will discharge home with instructions to continue with pain medication that was prescribed at last ED visit. I discussed results, treatment plan, need for urology follow-up, and return precautions with the patient. Provided opportunity for questions, patient confirmed understanding and is in agreement with plan.    Final Clinical Impression(s) / ED Diagnoses  Final diagnoses:  Ureterolithiasis  Hyperglycemia    Rx / DC Orders ED Discharge Orders     None        Janet Berlin 02/21/21 2328    Dorie Rank, MD 02/25/21 1718

## 2021-02-22 ENCOUNTER — Telehealth: Payer: Self-pay

## 2021-02-22 NOTE — Telephone Encounter (Signed)
Dr Margarita Rana -   I received this message from Marylouise Stacks, EMT. He is not part  of the community paramedicine program but she saw him for a routine EMS call after he called 911 for flank pain/ kidney stones. Carilyn Goodpasture tried to reach him today but the mailbox was full. His hospital follow up appointment is with Levada Dy 03/22/2021. I don't see a referral to urology.    He said he has been trying to contact urology to make his f/u appointment but I think hes just been in so much pain he cant really avoid having to call 911 or go to ER for pain management.

## 2021-02-22 NOTE — Telephone Encounter (Signed)
Unable to reach. Mailbox is full. No message left.

## 2021-02-23 ENCOUNTER — Encounter (HOSPITAL_COMMUNITY)
Admission: EM | Disposition: A | Discharge status: Home / Self Care | Payer: Self-pay | Source: Home / Self Care | Attending: Emergency Medicine

## 2021-02-23 ENCOUNTER — Observation Stay (HOSPITAL_COMMUNITY): Payer: Medicaid Other

## 2021-02-23 ENCOUNTER — Observation Stay (HOSPITAL_COMMUNITY): Payer: Medicaid Other | Admitting: Certified Registered Nurse Anesthetist

## 2021-02-23 ENCOUNTER — Observation Stay (HOSPITAL_COMMUNITY)
Admission: EM | Admit: 2021-02-23 | Discharge: 2021-02-25 | Disposition: A | Discharge status: Home / Self Care | Payer: Medicaid Other | Attending: Emergency Medicine | Admitting: Emergency Medicine

## 2021-02-23 ENCOUNTER — Encounter (HOSPITAL_COMMUNITY): Payer: Self-pay

## 2021-02-23 ENCOUNTER — Other Ambulatory Visit: Payer: Self-pay

## 2021-02-23 ENCOUNTER — Emergency Department (HOSPITAL_COMMUNITY): Payer: Medicaid Other

## 2021-02-23 DIAGNOSIS — N132 Hydronephrosis with renal and ureteral calculous obstruction: Principal | ICD-10-CM | POA: Insufficient documentation

## 2021-02-23 DIAGNOSIS — E1140 Type 2 diabetes mellitus with diabetic neuropathy, unspecified: Secondary | ICD-10-CM | POA: Diagnosis present

## 2021-02-23 DIAGNOSIS — E119 Type 2 diabetes mellitus without complications: Secondary | ICD-10-CM | POA: Insufficient documentation

## 2021-02-23 DIAGNOSIS — M549 Dorsalgia, unspecified: Secondary | ICD-10-CM | POA: Diagnosis present

## 2021-02-23 DIAGNOSIS — Z7984 Long term (current) use of oral hypoglycemic drugs: Secondary | ICD-10-CM | POA: Diagnosis not present

## 2021-02-23 DIAGNOSIS — R109 Unspecified abdominal pain: Secondary | ICD-10-CM

## 2021-02-23 DIAGNOSIS — G8929 Other chronic pain: Secondary | ICD-10-CM | POA: Diagnosis present

## 2021-02-23 DIAGNOSIS — Z79899 Other long term (current) drug therapy: Secondary | ICD-10-CM | POA: Insufficient documentation

## 2021-02-23 DIAGNOSIS — F17210 Nicotine dependence, cigarettes, uncomplicated: Secondary | ICD-10-CM | POA: Diagnosis not present

## 2021-02-23 DIAGNOSIS — E1165 Type 2 diabetes mellitus with hyperglycemia: Secondary | ICD-10-CM | POA: Diagnosis present

## 2021-02-23 DIAGNOSIS — I10 Essential (primary) hypertension: Secondary | ICD-10-CM | POA: Diagnosis present

## 2021-02-23 DIAGNOSIS — E785 Hyperlipidemia, unspecified: Secondary | ICD-10-CM | POA: Diagnosis present

## 2021-02-23 DIAGNOSIS — N201 Calculus of ureter: Secondary | ICD-10-CM

## 2021-02-23 DIAGNOSIS — Z20822 Contact with and (suspected) exposure to covid-19: Secondary | ICD-10-CM | POA: Insufficient documentation

## 2021-02-23 DIAGNOSIS — N1330 Unspecified hydronephrosis: Secondary | ICD-10-CM | POA: Diagnosis not present

## 2021-02-23 HISTORY — PX: CYSTOSCOPY W/ URETERAL STENT PLACEMENT: SHX1429

## 2021-02-23 LAB — URINALYSIS, ROUTINE W REFLEX MICROSCOPIC
Bilirubin Urine: NEGATIVE
Glucose, UA: >=500 mg/dL — AB
Ketones, ur: 5 mg/dL — AB
Leukocytes,Ua: NEGATIVE
Nitrite: NEGATIVE
Protein, ur: NEGATIVE mg/dL
Specific Gravity, Urine: 1.01 (ref 1.005–1.030)
pH: 6 (ref 5.0–8.0)

## 2021-02-23 LAB — CBC
HCT: 43.6 % (ref 39.0–52.0)
Hemoglobin: 14.6 g/dL (ref 13.0–17.0)
MCH: 30.9 pg (ref 26.0–34.0)
MCHC: 33.5 g/dL (ref 30.0–36.0)
MCV: 92.4 fL (ref 80.0–100.0)
Platelets: 421 10*3/uL — ABNORMAL HIGH (ref 150–400)
RBC: 4.72 MIL/uL (ref 4.22–5.81)
RDW: 12.1 % (ref 11.5–15.5)
WBC: 9.1 10*3/uL (ref 4.0–10.5)
nRBC: 0 % (ref 0.0–0.2)

## 2021-02-23 LAB — BASIC METABOLIC PANEL
Anion gap: 9 (ref 5–15)
BUN: 26 mg/dL — ABNORMAL HIGH (ref 8–23)
CO2: 23 mmol/L (ref 22–32)
Calcium: 9.2 mg/dL (ref 8.9–10.3)
Chloride: 95 mmol/L — ABNORMAL LOW (ref 98–111)
Creatinine, Ser: 1.37 mg/dL — ABNORMAL HIGH (ref 0.61–1.24)
GFR, Estimated: 59 mL/min — ABNORMAL LOW (ref >60)
Glucose, Bld: 488 mg/dL — ABNORMAL HIGH (ref 70–99)
Potassium: 4.1 mmol/L (ref 3.5–5.1)
Sodium: 127 mmol/L — ABNORMAL LOW (ref 135–145)

## 2021-02-23 LAB — GLUCOSE, CAPILLARY
Glucose-Capillary: 132 mg/dL — ABNORMAL HIGH (ref 70–99)
Glucose-Capillary: 176 mg/dL — ABNORMAL HIGH (ref 70–99)

## 2021-02-23 LAB — RESP PANEL BY RT-PCR (FLU A&B, COVID) ARPGX2
Influenza A by PCR: NEGATIVE
Influenza B by PCR: NEGATIVE
SARS Coronavirus 2 by RT PCR: NEGATIVE

## 2021-02-23 LAB — CBG MONITORING, ED: Glucose-Capillary: 321 mg/dL — ABNORMAL HIGH (ref 70–99)

## 2021-02-23 SURGERY — CYSTOSCOPY, WITH RETROGRADE PYELOGRAM AND URETERAL STENT INSERTION
Anesthesia: General | Laterality: Right

## 2021-02-23 MED ORDER — LACTATED RINGERS IV BOLUS
1000.0000 mL | Freq: Once | INTRAVENOUS | Status: AC
  Administered 2021-02-23: 1000 mL via INTRAVENOUS

## 2021-02-23 MED ORDER — AMLODIPINE BESYLATE 10 MG PO TABS
10.0000 mg | ORAL_TABLET | Freq: Every day | ORAL | Status: DC
  Administered 2021-02-23 – 2021-02-25 (×3): 10 mg via ORAL
  Filled 2021-02-23 (×3): qty 1

## 2021-02-23 MED ORDER — PROPOFOL 10 MG/ML IV BOLUS
INTRAVENOUS | Status: DC | PRN
  Administered 2021-02-23: 150 mg via INTRAVENOUS

## 2021-02-23 MED ORDER — GLIPIZIDE ER 5 MG PO TB24
10.0000 mg | ORAL_TABLET | Freq: Every day | ORAL | Status: DC
  Administered 2021-02-24 – 2021-02-25 (×2): 10 mg via ORAL
  Filled 2021-02-23 (×2): qty 2

## 2021-02-23 MED ORDER — HYDROMORPHONE HCL 1 MG/ML IJ SOLN
1.0000 mg | Freq: Once | INTRAMUSCULAR | Status: AC
  Administered 2021-02-23: 1 mg via INTRAVENOUS
  Filled 2021-02-23: qty 1

## 2021-02-23 MED ORDER — PROMETHAZINE HCL 25 MG/ML IJ SOLN: 6.2500 mg | INTRAMUSCULAR | Status: DC | PRN

## 2021-02-23 MED ORDER — GADOBUTROL 1 MMOL/ML IV SOLN
7.0000 mL | Freq: Once | INTRAVENOUS | Status: AC | PRN
  Administered 2021-02-23: 7 mL via INTRAVENOUS

## 2021-02-23 MED ORDER — CHLORHEXIDINE GLUCONATE 0.12 % MT SOLN
15.0000 mL | Freq: Once | OROMUCOSAL | Status: AC
  Administered 2021-02-23: 15 mL via OROMUCOSAL

## 2021-02-23 MED ORDER — METFORMIN HCL 500 MG PO TABS
1000.0000 mg | ORAL_TABLET | Freq: Two times a day (BID) | ORAL | Status: DC
  Administered 2021-02-24 – 2021-02-25 (×4): 1000 mg via ORAL
  Filled 2021-02-23 (×4): qty 2

## 2021-02-23 MED ORDER — INSULIN ASPART 100 UNIT/ML IJ SOLN
5.0000 [IU] | Freq: Once | INTRAMUSCULAR | Status: AC
  Administered 2021-02-23: 5 [IU] via SUBCUTANEOUS
  Filled 2021-02-23: qty 0.05

## 2021-02-23 MED ORDER — SUCCINYLCHOLINE CHLORIDE 200 MG/10ML IV SOSY
PREFILLED_SYRINGE | INTRAVENOUS | Status: DC | PRN
Start: 2021-02-23 — End: 2021-02-23
  Administered 2021-02-23: 120 mg via INTRAVENOUS

## 2021-02-23 MED ORDER — SODIUM CHLORIDE 0.9 % IR SOLN
Status: DC | PRN
  Administered 2021-02-23: 3000 mL

## 2021-02-23 MED ORDER — EPHEDRINE SULFATE-NACL 50-0.9 MG/10ML-% IV SOSY
PREFILLED_SYRINGE | INTRAVENOUS | Status: DC | PRN
  Administered 2021-02-23 (×3): 5 mg via INTRAVENOUS

## 2021-02-23 MED ORDER — LACTATED RINGERS IV SOLN: INTRAVENOUS | Status: DC

## 2021-02-23 MED ORDER — ACETAMINOPHEN 325 MG PO TABS: 650.0000 mg | ORAL_TABLET | Freq: Four times a day (QID) | ORAL | Status: DC | PRN

## 2021-02-23 MED ORDER — ONDANSETRON HCL 4 MG/2ML IJ SOLN
4.0000 mg | Freq: Four times a day (QID) | INTRAMUSCULAR | Status: DC | PRN
  Administered 2021-02-23 (×2): 4 mg via INTRAVENOUS
  Filled 2021-02-23: qty 2

## 2021-02-23 MED ORDER — ACETAMINOPHEN 650 MG RE SUPP: 650.0000 mg | Freq: Four times a day (QID) | RECTAL | Status: DC | PRN

## 2021-02-23 MED ORDER — FENTANYL CITRATE PF 50 MCG/ML IJ SOSY: 25.0000 ug | PREFILLED_SYRINGE | INTRAMUSCULAR | Status: DC | PRN

## 2021-02-23 MED ORDER — IOHEXOL 300 MG/ML  SOLN
INTRAMUSCULAR | Status: DC | PRN
  Administered 2021-02-23: 15 mL via URETHRAL

## 2021-02-23 MED ORDER — AMISULPRIDE (ANTIEMETIC) 5 MG/2ML IV SOLN: 10.0000 mg | Freq: Once | INTRAVENOUS | Status: DC | PRN

## 2021-02-23 MED ORDER — CEFAZOLIN SODIUM-DEXTROSE 2-3 GM-%(50ML) IV SOLR
INTRAVENOUS | Status: DC | PRN
  Administered 2021-02-23: 2 g via INTRAVENOUS

## 2021-02-23 MED ORDER — CYCLOBENZAPRINE HCL 10 MG PO TABS
10.0000 mg | ORAL_TABLET | Freq: Two times a day (BID) | ORAL | Status: DC | PRN
  Administered 2021-02-24 (×2): 10 mg via ORAL
  Filled 2021-02-23 (×2): qty 1

## 2021-02-23 MED ORDER — DEXAMETHASONE 0.5 MG PO TABS
1.0000 mg | ORAL_TABLET | Freq: Once | ORAL | Status: AC
  Administered 2021-02-23: 1 mg via ORAL
  Filled 2021-02-23 (×2): qty 2

## 2021-02-23 MED ORDER — DEXAMETHASONE SODIUM PHOSPHATE 10 MG/ML IJ SOLN
INTRAMUSCULAR | Status: DC | PRN
  Administered 2021-02-23: 4 mg via INTRAVENOUS

## 2021-02-23 MED ORDER — PHENYLEPHRINE 40 MCG/ML (10ML) SYRINGE FOR IV PUSH (FOR BLOOD PRESSURE SUPPORT)
PREFILLED_SYRINGE | INTRAVENOUS | Status: DC | PRN
  Administered 2021-02-23: 80 ug via INTRAVENOUS

## 2021-02-23 MED ORDER — MORPHINE SULFATE (PF) 4 MG/ML IV SOLN
4.0000 mg | Freq: Once | INTRAVENOUS | Status: AC
  Administered 2021-02-23: 4 mg via INTRAVENOUS
  Filled 2021-02-23: qty 1

## 2021-02-23 MED ORDER — OXYCODONE HCL 5 MG PO TABS: 5.0000 mg | ORAL_TABLET | Freq: Once | ORAL | Status: DC | PRN

## 2021-02-23 MED ORDER — OXYCODONE HCL 5 MG/5ML PO SOLN: 5.0000 mg | Freq: Once | ORAL | Status: DC | PRN

## 2021-02-23 MED ORDER — SODIUM CHLORIDE 0.9 % IV BOLUS
1000.0000 mL | Freq: Once | INTRAVENOUS | Status: AC
  Administered 2021-02-23: 1000 mL via INTRAVENOUS

## 2021-02-23 MED ORDER — HYDROMORPHONE HCL 1 MG/ML IJ SOLN
1.0000 mg | INTRAMUSCULAR | Status: DC | PRN
  Administered 2021-02-23 (×2): 1 mg via INTRAVENOUS
  Filled 2021-02-23 (×3): qty 1

## 2021-02-23 MED ORDER — LIDOCAINE HCL (PF) 2 % IJ SOLN
INTRAMUSCULAR | Status: DC | PRN
  Administered 2021-02-23: 80 mg via INTRADERMAL

## 2021-02-23 MED ORDER — SODIUM CHLORIDE 0.9 % IV SOLN: INTRAVENOUS | Status: DC

## 2021-02-23 SURGICAL SUPPLY — 14 items
BAG URO CATCHER STRL LF (MISCELLANEOUS) ×3 IMPLANT
CATH URETL OPEN 5X70 (CATHETERS) ×2 IMPLANT
CLOTH BEACON ORANGE TIMEOUT ST (SAFETY) ×3 IMPLANT
GLOVE SURG ENC TEXT LTX SZ7 (GLOVE) ×3 IMPLANT
GOWN STRL REUS W/TWL LRG LVL3 (GOWN DISPOSABLE) ×3 IMPLANT
GUIDEWIRE STR DUAL SENSOR (WIRE) ×3 IMPLANT
GUIDEWIRE ZIPWRE .038 STRAIGHT (WIRE) IMPLANT
MANIFOLD NEPTUNE II (INSTRUMENTS) ×3 IMPLANT
PACK CYSTO (CUSTOM PROCEDURE TRAY) ×3 IMPLANT
STENT URET 6FRX26 CONTOUR (STENTS) ×2 IMPLANT
SYR 10ML LL (SYRINGE) ×3 IMPLANT
TUBING CONNECTING 10 (TUBING) ×2 IMPLANT
TUBING CONNECTING 10' (TUBING) ×1
TUBING UROLOGY SET (TUBING) IMPLANT

## 2021-02-23 NOTE — Transfer of Care (Signed)
Immediate Anesthesia Transfer of Care Note  Patient: David Wyatt  Procedure(s) Performed: CYSTOSCOPY WITH RETROGRADE PYELOGRAM/URETERAL STENT PLACEMENT (Right)  Patient Location: PACU  Anesthesia Type:General  Level of Consciousness: awake  Airway & Oxygen Therapy: Patient Spontanous Breathing and Patient connected to face mask oxygen  Post-op Assessment: Report given to RN and Post -op Vital signs reviewed and stable  Post vital signs: Reviewed and stable  Last Vitals:  Vitals Value Taken Time  BP 151/97 02/23/21 1719  Temp 36.5 C 02/23/21 1719  Pulse 91 02/23/21 1722  Resp 18 02/23/21 1722  SpO2 98 % 02/23/21 1722  Vitals shown include unvalidated device data.  Last Pain:  Vitals:   02/23/21 1719  TempSrc:   PainSc: 0-No pain         Complications: No notable events documented.

## 2021-02-23 NOTE — Anesthesia Procedure Notes (Signed)
Procedure Name: Intubation Date/Time: 02/23/2021 4:47 PM Performed by: Milford Cage, CRNA Pre-anesthesia Checklist: Patient identified, Emergency Drugs available, Suction available and Patient being monitored Patient Re-evaluated:Patient Re-evaluated prior to induction Oxygen Delivery Method: Circle system utilized Preoxygenation: Pre-oxygenation with 100% oxygen Induction Type: IV induction and Rapid sequence Laryngoscope Size: Mac and 3 Grade View: Grade I Tube type: Oral Tube size: 7.0 mm Number of attempts: 1 Airway Equipment and Method: Stylet Placement Confirmation: ETT inserted through vocal cords under direct vision, positive ETCO2 and breath sounds checked- equal and bilateral Secured at: 24 cm Tube secured with: Tape Dental Injury: Teeth and Oropharynx as per pre-operative assessment

## 2021-02-23 NOTE — Telephone Encounter (Signed)
noted 

## 2021-02-23 NOTE — Consult Note (Signed)
Urology Consult   Reason for consult: Tennis Must, MD  History of Present Illness: David Wyatt is a 60 y.o. who presents as his third ED admission in 4 days with complaints of persistent right flank pain for approximately 1 week.  He denies fevers or chills.  He denies dysuria or gross hematuria.  He states he has had severe intermittent right flank pain.  He denies history of prior urolithiasis.  He has no exacerbating or alleviating factors.  CT A/P 02/23/2021 with 5 mm and 3 mm proximal right ureteral stones at the L4 level with moderate proximal right hydroureteronephrosis.  He also has additional nonobstructing stones bilaterally.  He was also incidentally found to have a 2.4 cm indeterminate mass within the low anterior left kidney. He denies a family history of urologic malignancy.  He also has a stable left adrenal nodule measuring 2.1 cm.  He denies a history of voiding or storage urinary symptoms, hematuria, UTIs, STDs, urolithiasis, GU malignancy/trauma/surgery.  Past Medical History:  Diagnosis Date   Angioedema of lips 07/28/2012   left upper (07/29/2012)   Arthritis    hands and back   Chronic back pain    Cocaine abuse ([place])    Diabetic neuropathy (HCC)    High cholesterol    Hypertension    Neuropathy    Type II diabetes mellitus ([place])     Past Surgical History:  Procedure Laterality Date   BACK SURGERY     HERNIA REPAIR Right 04/01/2012   I & D EXTREMITY Left 12/13/2012   Procedure: IRRIGATION AND DEBRIDEMENT LEFT THUMB;  Surgeon: Tennis Must, MD;  Location: [facility];  Service: Orthopedics;  Laterality: Left;   INCISION AND DRAINAGE OF WOUND     boil on back/notes 07/14/2008 (07/29/2012)   INGUINAL HERNIA REPAIR  04/01/2012   Procedure: HERNIA REPAIR INGUINAL ADULT;  Surgeon: Haywood Lasso, MD;  Location: [place];  Service: General;  Laterality: Right;   INGUINAL HERNIA REPAIR Left 09/06/2016   Procedure: OPEN REPAIR LEFT INGUINAL HERNIA;  Surgeon: Greer Pickerel,  MD;  Location: [place];  Service: General;  Laterality: Left;   INSERTION OF MESH Left 09/06/2016   Procedure: INSERTION OF MESH;  Surgeon: Greer Pickerel, MD;  Location: [facility] OR;  Service: General;  Laterality: Left;   TONSILLECTOMY      Medications:  Home meds:  No current facility-administered medications on file prior to encounter.   Current Outpatient Medications on File Prior to Encounter  Medication Sig Dispense Refill   amLODipine (NORVASC) 10 MG tablet Take 1 tablet (10 mg total) by mouth daily. 30 tablet 6   atorvastatin (LIPITOR) 40 MG tablet Take 1 tablet (40 mg total) by mouth daily. 30 tablet 6   cyclobenzaprine (FLEXERIL) 10 MG tablet Take 1 tablet (10 mg total) by mouth 2 (two) times daily as needed for muscle spasms. 40 tablet 6   DULoxetine (CYMBALTA) 60 MG capsule Take 1 capsule (60 mg total) by mouth daily. 30 capsule 6   empagliflozin (JARDIANCE) 10 MG TABS tablet Take 1 tablet (10 mg total) by mouth daily before breakfast. 30 tablet 6   gabapentin (NEURONTIN) 300 MG capsule Take 2 capsules (600 mg total) by mouth 3 (three) times daily. 180 capsule 6   glipiZIDE (GLUCOTROL XL) 10 MG 24 hr tablet Take 1 tablet (10 mg total) by mouth daily with breakfast. 30 tablet 6   metFORMIN (GLUCOPHAGE) 1000 MG tablet Take 1 tablet (1,000 mg total) by mouth 2 (two) times  daily. 60 tablet 6   ondansetron (ZOFRAN) 4 MG tablet Take 1 tablet (4 mg total) by mouth every 6 (six) hours. (Patient taking differently: Take 4 mg by mouth every 6 (six) hours as needed for nausea or vomiting.) 12 tablet 0   Accu-Chek Softclix Lancets lancets Test blood sugar 2 times daily (Patient taking differently: 1 each by Other route See admin instructions. Test blood sugar 2 times daily) 100 each 6   Blood Glucose Monitoring Suppl (ACCU-CHEK GUIDE) w/Device KIT USE AS DIRECTED TO CHECK BLOOD SUGARS UP TO TWICE PER DAY (Patient taking differently: 1 each by Other route 2 (two) times daily.) 1 kit 0   Blood Glucose  Monitoring Suppl (ACCU-CHEK GUIDE) w/Device KIT Use as directed (Patient taking differently: 1 each by Other route 2 (two) times daily.) 1 kit 0   fluticasone (FLONASE) 50 MCG/ACT nasal spray Place 2 sprays into both nostrils daily. (Patient not taking: Reported on 02/23/2021) 16 g 0   glucose blood test strip Check blood sugar 2 times daily (Patient taking differently: 1 each by Other route 2 (two) times daily.) 100 each 6   HYDROcodone-acetaminophen (NORCO/VICODIN) 5-325 MG tablet Take 1 tablet by mouth every 6 (six) hours as needed. (Patient not taking: Reported on 02/23/2021) 8 tablet 0   Insulin Pen Needle 32G X 4 MM MISC Use with Victoza (Patient taking differently: 1 each by Other route See admin instructions. Use with Victoza) 100 each 0   liraglutide (VICTOZA) 18 MG/3ML SOPN Inject 0.6 mg once daily (Patient not taking: Reported on 12/30/2020) 36 mL 6   naproxen (NAPROSYN) 375 MG tablet Take 1 tablet (375 mg total) by mouth 2 (two) times daily. (Patient not taking: Reported on 02/23/2021) 20 tablet 0   [DISCONTINUED] loratadine (CLARITIN) 10 MG tablet Take 1 tablet (10 mg total) by mouth daily. (Patient not taking: No sig reported) 30 tablet 0     Scheduled Meds: Continuous Infusions:  sodium chloride 100 mL/hr at 02/23/21 1311   PRN Meds:.acetaminophen **OR** acetaminophen, HYDROmorphone (DILAUDID) injection, ondansetron (ZOFRAN) IV  Allergies:  Allergies  Allergen Reactions   Lisinopril Other (See Comments)    Lip Swelling, angioedema    Family History  Problem Relation Age of Onset   Diabetes Mother    Kidney disease Mother    Hyperlipidemia Mother    Hyperlipidemia Father    Diabetes Father     Social History:  reports that he has been smoking cigarettes. He has a 7.50 pack-year smoking history. He quit smokeless tobacco use about 5 years ago. He reports current drug use. Drug: "Crack" cocaine. He reports that he does not drink alcohol.  ROS: A complete review of  systems was performed.  All systems are negative except for pertinent findings as noted.  Physical Exam:  Vital signs in last 24 hours: Temp:  [97.9 F (36.6 C)] 97.9 F (36.6 C) (12/16 0506) Pulse Rate:  [50-67] 53 (12/16 1330) Resp:  [18-20] 18 (12/16 1030) BP: (146-206)/(86-98) 181/95 (12/16 1330) SpO2:  [95 %-100 %] 98 % (12/16 1330) Weight:  [68 kg] 68 kg (12/16 1015) Constitutional:  Alert and oriented, No acute distress Cardiovascular: Regular rate and rhythm Respiratory: Normal respiratory effort, Lungs clear bilaterally GI: Abdomen is soft, nontender, nondistended, no abdominal masses Genitourinary: No CVAT. Normal male phallus, testes are descended bilaterally and non-tender and without masses, scrotum is normal in appearance without lesions or masses, perineum is normal on inspection. Neurologic: Grossly intact, no focal deficits Psychiatric: Normal mood and  affect  Laboratory Data:  Recent Labs    02/21/21 1258 02/23/21 0548  WBC 5.6 9.1  HGB 15.8 14.6  HCT 46.4 43.6  PLT 404* 421*    Recent Labs    02/21/21 1258 02/23/21 0548  NA 131* 127*  K 4.7 4.1  CL 96* 95*  GLUCOSE 349* 488*  BUN 27* 26*  CALCIUM 9.2 9.2  CREATININE 1.14 1.37*     Results for orders placed or performed during the hospital encounter of 02/23/21 (from the past 24 hour(s))  CBC     Status: Abnormal   Collection Time: 02/23/21  5:48 AM  Result Value Ref Range   WBC 9.1 4.0 - 10.5 K/uL   RBC 4.72 4.22 - 5.81 MIL/uL   Hemoglobin 14.6 13.0 - 17.0 g/dL   HCT 43.6 39.0 - 52.0 %   MCV 92.4 80.0 - 100.0 fL   MCH 30.9 26.0 - 34.0 pg   MCHC 33.5 30.0 - 36.0 g/dL   RDW 12.1 11.5 - 15.5 %   Platelets 421 (H) 150 - 400 K/uL   nRBC 0.0 0.0 - 0.2 %  Basic metabolic panel     Status: Abnormal   Collection Time: 02/23/21  5:48 AM  Result Value Ref Range   Sodium 127 (L) 135 - 145 mmol/L   Potassium 4.1 3.5 - 5.1 mmol/L   Chloride 95 (L) 98 - 111 mmol/L   CO2 23 22 - 32 mmol/L    Glucose, Bld 488 (H) 70 - 99 mg/dL   BUN 26 (H) 8 - 23 mg/dL   Creatinine, Ser 1.37 (H) 0.61 - 1.24 mg/dL   Calcium 9.2 8.9 - 10.3 mg/dL   GFR, Estimated 59 (L) >60 mL/min   Anion gap 9 5 - 15  Urinalysis, Routine w reflex microscopic Urine, Clean Catch     Status: Abnormal   Collection Time: 02/23/21  5:48 AM  Result Value Ref Range   Color, Urine YELLOW YELLOW   APPearance CLEAR CLEAR   Specific Gravity, Urine 1.010 1.005 - 1.030   pH 6.0 5.0 - 8.0   Glucose, UA >=500 (A) NEGATIVE mg/dL   Hgb urine dipstick SMALL (A) NEGATIVE   Bilirubin Urine NEGATIVE NEGATIVE   Ketones, ur 5 (A) NEGATIVE mg/dL   Protein, ur NEGATIVE NEGATIVE mg/dL   Nitrite NEGATIVE NEGATIVE   Leukocytes,Ua NEGATIVE NEGATIVE   RBC / HPF 0-5 0 - 5 RBC/hpf   WBC, UA 0-5 0 - 5 WBC/hpf   Bacteria, UA RARE (A) NONE SEEN   Squamous Epithelial / LPF 0-5 0 - 5  CBG monitoring, ED     Status: Abnormal   Collection Time: 02/23/21 11:27 AM  Result Value Ref Range   Glucose-Capillary 321 (H) 70 - 99 mg/dL   No results found for this or any previous visit (from the past 240 hour(s)).  Renal Function: Recent Labs    02/17/21 0433 02/17/21 0456 02/17/21 1301 02/19/21 1052 02/21/21 1258 02/23/21 0548  CREATININE 1.60* 1.50* 1.54* 1.66* 1.14 1.37*   Estimated Creatinine Clearance: 54.5 mL/min (A) (by C-G formula based on SCr of 1.37 mg/dL (H)).  Radiologic Imaging: CT RENAL STONE STUDY  Result Date: 02/23/2021 CLINICAL DATA:  Right flank pain with kidney stone. Increasing pain. EXAM: CT ABDOMEN AND PELVIS WITHOUT CONTRAST TECHNIQUE: Multidetector CT imaging of the abdomen and pelvis was performed following the standard protocol without IV contrast. COMPARISON:  CT without contrast 02/21/2021. FINDINGS: Lower chest: No acute abnormality. Hepatobiliary: No focal liver abnormality  is seen. No gallstones, gallbladder wall thickening, or biliary dilatation. Pancreas: Not well seen due to the patient having very low body  fat. No obvious mass or ductal dilatation. Spleen: Unremarkable. Adrenals/Urinary Tract: 2.1 cm left adrenal stable nodule. Normal right adrenal gland. There is continued moderate right hydroureteronephrosis above 2 tandem right ureteral stones measuring 5 mm and 3 mm at the level of the L4 superior endplate, unchanged. No other ureteral stones are seen. Nonobstructive nephrolithiasis within both kidneys is unchanged. 2.4 cm indeterminate mass again noted low anterior left kidney. Mild bladder thickening versus underdistention. Stomach/Bowel: Also not well seen due to lack of contrast and body fat. No small bowel dilatation is evident. Moderate to severe retained stool is again shown, normal appendix and no appreciable wall thickening of the colon. Scattered sigmoid diverticula. Vascular/Lymphatic: Aortic atherosclerosis. No enlarged abdominal or pelvic lymph nodes. Reproductive: Slight prostatomegaly with central calcifications. Pelvic phleboliths. Other: Trace posterior pelvic ascites unchanged. Musculoskeletal: Degenerative and postsurgical change of the spine. IMPRESSION: 1. Unchanged positioning of tandem 5 mm and 3 mm proximal right ureteral stones at the superior L4 level, with continued moderate upstream hydroureteronephrosis. Correlate clinically for infectious complication. 2. Additional nonobstructive nephrolithiasis and stable left adrenal nodule. 3. 2.3 cm indeterminate low-density left renal lesion. MRI or ultrasound follow-up recommended. 1. Constipation. 2. Persistent trace ascites. 3. Prostatomegaly with mild bladder thickening versus nondistention. Electronically Signed   By: Telford Nab M.D.   On: 02/23/2021 05:57   CT Renal Stone Study  Result Date: 02/21/2021 CLINICAL DATA:  Right flank pain with recent CT showing a right ureteral stone. EXAM: CT ABDOMEN AND PELVIS WITHOUT CONTRAST TECHNIQUE: Multidetector CT imaging of the abdomen and pelvis was performed following the standard protocol  without IV contrast. COMPARISON:  Recent CT no contrast 02/17/2021, older CT with contrast 06/16/2016 FINDINGS: Lower chest: There are few linear scar-like opacities in the lung bases without infiltrates. The cardiac size is normal. Hepatobiliary: Unremarkable noncontrast appearance of the liver, gallbladder bile ducts. Pancreas: Not adequately visualized due to a general paucity of body fat. No obvious mass or ductal dilatation. Spleen: Unremarkable without contrast.  No splenomegaly. Adrenals/Urinary Tract: Stable 2.1 cm left adrenal nodule. No right adrenal lesions. Tandem proximal right ureteral stones are again noted measuring 5 mm and 3 mm, largest one on top of the smaller stone, both now at the level of the L4 superior endplate previously at the level the L3 lower plate having moved inferiorly no more than 1 cm. There is a 4 mm nonobstructive caliceal stone in the right lower pole, 3 mm nonobstructing stone in the left lower pole collecting system. A small cortical calcification in the outer left kidney is again shown. There is a 2.4 cm mass of the lower anterior left kidney of 33 Hounsfield units above the usual density of simple fluid, in 2018 was 1.9 cm. Ultrasound or MRI recommended to assess for internal complexity. The unenhanced renal cortex is otherwise unremarkable. No significant bladder thickening is seen. Stomach/Bowel: Not well evaluated due to lack of contrast and minimal body fat. The appendix is visible and unremarkable, no dilated or frankly thickened small bowel or stomach lining is seen. There is moderate to severe fecal stasis. There are colonic diverticula without wall thickening or adjacent inflammation. Vascular/Lymphatic: Aortic atherosclerosis. No enlarged abdominal or pelvic lymph nodes. Please note, assessment for adenopathy limited due to unopacified bowel and minimal body fat. Reproductive: The prostate is slightly enlarged. There are calcifications in the central prostate. Mild  impression on the posterior bladder. Pelvic phleboliths. Other: Trace ascites in the posterior deep pelvis. No free air, hemorrhage or abscess , no ventral hernia. Musculoskeletal: Old fusion hardware, laminectomy and interbody disc space apparatus L4-5. Degenerative changes lumbar spine mild osteopenia. Bone graft harvest site chronically noted posterior left ilium. IMPRESSION: 1. Tandem 5 mm and 3 mm proximal right ureteral stones at the level of the superior endplate of L4, only about 1 cm more inferior to the position of the stones 4 days ago. 2. Nonobstructive nephrolithiasis. 3. 2.3 cm indeterminate low-density lesion, slightly larger than in 2018, low anterior left kidney. Most likely this is a hyperdense cyst but ultrasound or MRI follow-up recommended to assess for internal complexity. 4. Moderate to severe fecal stasis, with uncomplicated colonic diverticula. 5. Trace ascites. 6. Slightly prominent prostate. Electronically Signed   By: Telford Nab M.D.   On: 02/21/2021 20:12    I independently reviewed the above imaging studies.  Impression/Recommendation Right renal colic: CT A/P 37/12/6267 with 5 mm and 3 mm proximal right ureteral stones with proximal right hydroureteronephrosis.  No sign of infection.  He has had persistent pain refractory to medication requiring 3 ED visits within the past week. AKI: Creatinine 1.37 from baseline of 1. Indeterminate left lower anterior mass measuring 2.4 cm on CT A/P Left adrenal nodule measuring 2.1 cm  -We discussed options including pain management, ureteral stent today with delayed definitive treatment of the stones.  He elects to proceed with ureteral stent placement.  He understands risk and benefits. -We will also plan for MRI of the kidney to further evaluate his indeterminate renal mass.  We will plan for this during this admission. -I recommend adrenal labs to evaluate for his adrenal nodule.  -The risks, benefits and alternatives of  cystoscopy with right JJ stent placement was discussed with the patient.  Risks include, but are not limited to: bleeding, urinary tract infection, ureteral injury, ureteral stricture disease, chronic pain, urinary symptoms, bladder injury, stent migration, the need for nephrostomy tube placement, MI, CVA, DVT, PE and the inherent risks with general anesthesia.  The patient voices understanding and wishes to proceed.    Matt R. Telford Archambeau MD 02/23/2021, 1:43 PM  [facility]  Pager: [phone]   CC: Tennis Must, MD

## 2021-02-23 NOTE — Op Note (Signed)
Operative Note  Preoperative diagnosis:  1.  Right ureteral stones 2. Right hydronephrosis 3. Renal colic  Postoperative diagnosis: 1.  Right ureteral stones 2. Right hydronephrosis 3. Renal colic  Procedure(s): 1.  Cystoscopy 2. Right retrograde pyelogram with interpretation 3. Right ureteral stent placement 4. Fluoroscopy <1 hour with intraoperative interpretation  Surgeon: Rexene Alberts, MD  Assistants:  None  Anesthesia:  General  Complications:  None  EBL:  Minimal  Specimens: 1. None  Drains/Catheters: 1.  Right 6Fr x 26cm ureteral stent  Intraoperative findings:   Cystoscopy demonstrated no suspicious lesions, masses, stones or other pathology. 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 Right retrograde pyelogram demonstrated severe right hydronephrosis. Successful right  ureteral stent placement with curl in the renal pelvis and bladder respectively.  Indication:  David Wyatt is a 60 y.o. male with 2 separate right proximal ureteral stones each measuring 5 mm and 3 mm with proximal right hydroureteronephrosis.  He presented the ED 3 times this week for the same complaints of right renal colic.  He had no sign of infection.  After reviewing the management options for treatment, he elected to proceed with the above surgical procedure(s). We have discussed the potential benefits and risks of the procedure, side effects of the proposed treatment, the likelihood of the patient achieving the goals of the procedure, and any potential problems that might occur during the procedure or recuperation. Informed consent has been obtained.  Description of procedure: The patient was taken to the operating room and general anesthesia was induced.  The patient was placed in the dorsal lithotomy position, prepped and draped in the usual sterile fashion, and preoperative antibiotics were administered. A preoperative time-out was performed.    Cystourethroscopy was performed.  The patients urethra was examined and identified wide caliber bulbar urethral stenosis that was passed to dilate with the scope. There was some mild bilobar prostatic hypertrophy. The bladder was then systematically examined in its entirety. There was no evidence for any bladder tumors, stones, or other mucosal pathology.    Attention then turned to the right ureteral orifice. A 0.038 zip wire was passed through the right orifice and over the wire a 5 Fr open ended catheter was inserted and passed up to the level of the renal pelvis. Omnipaque contrast was injected through the ureteral catheter and a retrograde pyelogram was performed with findings as dictated above. The wire was then replaced and the open ended catheter was removed.   A 6Fr x 26cm ureteral stent was advance over the wire. The stent was positioned appropriately under fluoroscopic and cystoscopic guidance.  The wire was then removed with an adequate stent curl noted in the renal pelvis as well as in the bladder.  The bladder was then emptied and the procedure ended.  The patient appeared to tolerate the procedure well and without complications.  The patient was able to be awakened and transferred to the recovery unit in satisfactory condition.   Plan: He will be admitted overnight for pain control.  We will plan for MRI to evaluate his indeterminate left renal lesion.  Further, or adrenal labs.  I discussed with him the plan and we will arrange follow-up outpatient to discuss definitive treatment of his right ureteral stones.  We did discuss the stent is temporary and must be removed or exchanged at the time of the next operation to definitively treat his stones.  Matt R. [facility]  Pager:  [phone]  °

## 2021-02-23 NOTE — ED Provider Notes (Signed)
[facility] DEPT Provider Note   CSN: [id number] Arrival date & time: 02/23/21  9485     History Chief Complaint  Patient presents with   Flank Pain    David Wyatt is a 60 y.o. male with a past medical history of diabetes, hypertension, who presents to the ED complaining of right flank pain x1 week.  Patient reports he has been seen in the ED multiple times for this with no resolution of his symptoms.  Reports he had an appointment with urology today however was not able to make it due to pain.  He has associated dysuria and right lower abdominal pain.  He has not tried any medications for his symptoms.  He denies fever, chills, chest pain, shortness of breath, nausea, vomiting, hematuria, urgency, frequency.  The history is provided by the patient. No language interpreter was used.  Flank Pain This is a new problem. The current episode started more than 1 week ago. The problem occurs constantly. The problem has been gradually worsening. Associated symptoms include abdominal pain (RLQ). Pertinent negatives include no chest pain, no headaches and no shortness of breath. Exacerbated by: movement. Nothing relieves the symptoms. Treatments tried: Rx meds. The treatment provided no relief.      Past Medical History:  Diagnosis Date   Angioedema of lips 07/28/2012   left upper (07/29/2012)   Arthritis    hands and back   Chronic back pain    Cocaine abuse ([place])    Diabetic neuropathy (HCC)    High cholesterol    Hypertension    Neuropathy    Type II diabetes mellitus ([place])     Patient Active Problem List   Diagnosis Date Noted   Hydronephrosis of right kidney 02/23/2021   Tobacco abuse 08/19/2018   Hyperlipidemia 04/24/2017   GERD (gastroesophageal reflux disease) 06/13/2015   Controlled type 2 diabetes mellitus with complication, without long-term current use of insulin ([place]) 05/01/2015   Diabetic neuropathy ([place]) 05/01/2015   Essential  hypertension, benign 11/01/2013   Lumbar disc herniation 09/13/2013   Unspecified constipation 09/13/2013   Diabetes mellitus due to underlying condition without complications ([place]) 46/27/0350   Chronic back pain 09/13/2013   Viral gastroenteritis 05/05/2013   Felon of finger of left hand with lymphangitis 12/13/2012   Hyperglycemia 07/29/2012   Angioedema of lips 07/29/2012   Crack cocaine use 07/29/2012   Arthritis 07/29/2012   Weakness generalized 07/29/2012    Past Surgical History:  Procedure Laterality Date   BACK SURGERY     HERNIA REPAIR Right 04/01/2012   I & D EXTREMITY Left 12/13/2012   Procedure: IRRIGATION AND DEBRIDEMENT LEFT THUMB;  Surgeon: Tennis Must, MD;  Location: [facility];  Service: Orthopedics;  Laterality: Left;   INCISION AND DRAINAGE OF WOUND     boil on back/notes 07/14/2008 (07/29/2012)   INGUINAL HERNIA REPAIR  04/01/2012   Procedure: HERNIA REPAIR INGUINAL ADULT;  Surgeon: Haywood Lasso, MD;  Location: [facility];  Service: General;  Laterality: Right;   INGUINAL HERNIA REPAIR Left 09/06/2016   Procedure: OPEN REPAIR LEFT INGUINAL HERNIA;  Surgeon: Greer Pickerel, MD;  Location: [facility];  Service: General;  Laterality: Left;   INSERTION OF MESH Left 09/06/2016   Procedure: INSERTION OF MESH;  Surgeon: Greer Pickerel, MD;  Location[facility] OR;  Service: General;  Laterality: Left;   TONSILLECTOMY         Family History  Problem Relation Age of Onset   Kidney disease Mother  Diabetes Father     Social History   Tobacco Use   Smoking status: Every Day    Packs/day: 0.25    Years: 30.00    Pack years: 7.50    Types: Cigarettes   Smokeless tobacco: Former    Quit date: 08/15/2015   Tobacco comments:    04/29/16 2  cigs daily, 10/27/17 sometimes < .25 PPD  Vaping Use   Vaping Use: Never used  Substance Use Topics   Alcohol use: No    Alcohol/week: 0.0 standard drinks   Drug use: Yes    Types: "Crack" cocaine    Home Medications Prior to Admission  medications   Medication Sig Start Date End Date Taking? Authorizing Provider  Accu-Chek Softclix Lancets lancets Test blood sugar 2 times daily Patient taking differently: 1 each by Other route See admin instructions. Test blood sugar 2 times daily 11/03/20   Elsie Stain, MD  amLODipine (NORVASC) 10 MG tablet Take 1 tablet (10 mg total) by mouth daily. 10/26/20   Elsie Stain, MD  atorvastatin (LIPITOR) 40 MG tablet Take 1 tablet (40 mg total) by mouth daily. 10/26/20   Elsie Stain, MD  Blood Glucose Monitoring Suppl (ACCU-CHEK GUIDE) w/Device KIT USE AS DIRECTED TO CHECK BLOOD SUGARS UP TO TWICE PER DAY Patient taking differently: 1 each by Other route 2 (two) times daily. 02/17/19   [name], MD  Blood Glucose Monitoring Suppl (ACCU-CHEK GUIDE) w/Device KIT Use as directed Patient taking differently: 1 each by Other route 2 (two) times daily. 11/03/20   Elsie Stain, MD  cyclobenzaprine (FLEXERIL) 10 MG tablet Take 1 tablet (10 mg total) by mouth 2 (two) times daily as needed for muscle spasms. 12/26/20   [name], MD  DULoxetine (CYMBALTA) 60 MG capsule Take 1 capsule (60 mg total) by mouth daily. 10/26/20   Elsie Stain, MD  empagliflozin (JARDIANCE) 10 MG TABS tablet Take 1 tablet (10 mg total) by mouth daily before breakfast. 10/26/20   Elsie Stain, MD  fluticasone [facility]) 50 MCG/ACT nasal spray Place 2 sprays into both nostrils daily. 11/03/19   Argentina Donovan, PA-C  gabapentin (NEURONTIN) 300 MG capsule Take 2 capsules (600 mg total) by mouth 3 (three) times daily. 11/01/20   Elsie Stain, MD  glipiZIDE (GLUCOTROL XL) 10 MG 24 hr tablet Take 1 tablet (10 mg total) by mouth daily with breakfast. 10/26/20   Elsie Stain, MD  glucose blood test strip Check blood sugar 2 times daily Patient taking differently: 1 each by Other route 2 (two) times daily. 11/03/20   Elsie Stain, MD  HYDROcodone-acetaminophen (NORCO/VICODIN) 5-325 MG tablet  Take 1 tablet by mouth every 6 (six) hours as needed. 02/17/21   Valarie Merino, MD  Insulin Pen Needle 32G X 4 MM MISC Use with Victoza Patient taking differently: 1 each by Other route See admin instructions. Use with Victoza 11/03/20   Elsie Stain, MD  liraglutide (VICTOZA) 18 MG/3ML SOPN Inject 0.6 mg once daily Patient not taking: Reported on 12/30/2020 12/26/20   [name], MD  metFORMIN (GLUCOPHAGE) 1000 MG tablet Take 1 tablet (1,000 mg total) by mouth 2 (two) times daily. 12/26/20   [name], MD  naproxen (NAPROSYN) 375 MG tablet Take 1 tablet (375 mg total) by mouth 2 (two) times daily. 12/18/20   Blanchie Dessert, MD  ondansetron (ZOFRAN) 4 MG tablet Take 1 tablet (4 mg total) by mouth every 6 (six) hours. 02/17/21  Valarie Merino, MD  loratadine (CLARITIN) 10 MG tablet Take 1 tablet (10 mg total) by mouth daily. Patient not taking: No sig reported 06/03/17 07/20/20  [name], MD    Allergies    Lisinopril  Review of Systems   Review of Systems  Constitutional:  Negative for chills and fever.  Respiratory:  Negative for shortness of breath.   Cardiovascular:  Negative for chest pain.  Gastrointestinal:  Positive for abdominal pain (RLQ). Negative for nausea and vomiting.  Genitourinary:  Positive for flank pain (right).  Skin:  Negative for rash.  Neurological:  Negative for headaches.  All other systems reviewed and are negative.  Physical Exam Updated Vital Signs BP (!) 168/95 (BP Location: Right Arm)    Pulse 67    Temp 97.9 F (36.6 C) (Oral)    Resp 20    SpO2 100%   Physical Exam Vitals and nursing note reviewed.  Constitutional:      General: He is not in acute distress.    Appearance: He is not diaphoretic.  HENT:     Head: Normocephalic and atraumatic.     Mouth/Throat:     Pharynx: No oropharyngeal exudate.  Eyes:     General: No scleral icterus.    Conjunctiva/sclera: Conjunctivae normal.  Cardiovascular:     Rate and  Rhythm: Normal rate and regular rhythm.     Pulses: Normal pulses.     Heart sounds: Normal heart sounds.  Pulmonary:     Effort: Pulmonary effort is normal. No respiratory distress.     Breath sounds: Normal breath sounds. No wheezing.  Abdominal:     General: Bowel sounds are normal.     Palpations: Abdomen is soft. There is no mass.     Tenderness: There is abdominal tenderness in the right lower quadrant. There is right CVA tenderness. There is no left CVA tenderness, guarding or rebound.     Comments: Right CVA tenderness to palpation.  No left CVA tenderness to palpation.  Mild right lower quadrant abdominal tenderness to palpation.  No rigidity, guarding, rebound.  No overlying skin changes.  Musculoskeletal:        General: Normal range of motion.     Cervical back: Normal range of motion and neck supple.     Comments: No C, T, L, S spinal tenderness to palpation.  Skin:    General: Skin is warm and dry.  Neurological:     Mental Status: He is alert.  Psychiatric:        Behavior: Behavior normal.    ED Results / Procedures / Treatments   Labs (all labs ordered are listed, but only abnormal results are displayed) Labs Reviewed  CBC - Abnormal; Notable for the following components:      Result Value   Platelets 421 (*)    All other components within normal limits  BASIC METABOLIC PANEL - Abnormal; Notable for the following components:   Sodium 127 (*)    Chloride 95 (*)    Glucose, Bld 488 (*)    BUN 26 (*)    Creatinine, Ser 1.37 (*)    GFR, Estimated 59 (*)    All other components within normal limits  URINALYSIS, ROUTINE W REFLEX MICROSCOPIC - Abnormal; Notable for the following components:   Glucose, UA >=500 (*)    Hgb urine dipstick SMALL (*)    Ketones, ur 5 (*)    Bacteria, UA RARE (*)    All other components within normal  limits    EKG None  Radiology DG Abdomen 1 View  Result Date: 02/21/2021 CLINICAL DATA:  Right-sided flank pain and  constipation EXAM: ABDOMEN - 1 VIEW COMPARISON:  02/17/2021 FINDINGS: The amount of gas and stool within the colon is within the range of normal. Small bowel pattern is normal. Small bilateral renal calculi remain visible. Previous lumbosacral discectomy and fusion. IMPRESSION: Gas pattern within normal limits presently. Small renal calculi. Electronically Signed   By: Nelson Chimes M.D.   On: 02/21/2021 13:13   CT RENAL STONE STUDY  Result Date: 02/23/2021 CLINICAL DATA:  Right flank pain with kidney stone. Increasing pain. EXAM: CT ABDOMEN AND PELVIS WITHOUT CONTRAST TECHNIQUE: Multidetector CT imaging of the abdomen and pelvis was performed following the standard protocol without IV contrast. COMPARISON:  CT without contrast 02/21/2021. FINDINGS: Lower chest: No acute abnormality. Hepatobiliary: No focal liver abnormality is seen. No gallstones, gallbladder wall thickening, or biliary dilatation. Pancreas: Not well seen due to the patient having very low body fat. No obvious mass or ductal dilatation. Spleen: Unremarkable. Adrenals/Urinary Tract: 2.1 cm left adrenal stable nodule. Normal right adrenal gland. There is continued moderate right hydroureteronephrosis above 2 tandem right ureteral stones measuring 5 mm and 3 mm at the level of the L4 superior endplate, unchanged. No other ureteral stones are seen. Nonobstructive nephrolithiasis within both kidneys is unchanged. 2.4 cm indeterminate mass again noted low anterior left kidney. Mild bladder thickening versus underdistention. Stomach/Bowel: Also not well seen due to lack of contrast and body fat. No small bowel dilatation is evident. Moderate to severe retained stool is again shown, normal appendix and no appreciable wall thickening of the colon. Scattered sigmoid diverticula. Vascular/Lymphatic: Aortic atherosclerosis. No enlarged abdominal or pelvic lymph nodes. Reproductive: Slight prostatomegaly with central calcifications. Pelvic phleboliths.  Other: Trace posterior pelvic ascites unchanged. Musculoskeletal: Degenerative and postsurgical change of the spine. IMPRESSION: 1. Unchanged positioning of tandem 5 mm and 3 mm proximal right ureteral stones at the superior L4 level, with continued moderate upstream hydroureteronephrosis. Correlate clinically for infectious complication. 2. Additional nonobstructive nephrolithiasis and stable left adrenal nodule. 3. 2.3 cm indeterminate low-density left renal lesion. MRI or ultrasound follow-up recommended. 1. Constipation. 2. Persistent trace ascites. 3. Prostatomegaly with mild bladder thickening versus nondistention. Electronically Signed   By: Telford Nab M.D.   On: 02/23/2021 05:57   CT Renal Stone Study  Result Date: 02/21/2021 CLINICAL DATA:  Right flank pain with recent CT showing a right ureteral stone. EXAM: CT ABDOMEN AND PELVIS WITHOUT CONTRAST TECHNIQUE: Multidetector CT imaging of the abdomen and pelvis was performed following the standard protocol without IV contrast. COMPARISON:  Recent CT no contrast 02/17/2021, older CT with contrast 06/16/2016 FINDINGS: Lower chest: There are few linear scar-like opacities in the lung bases without infiltrates. The cardiac size is normal. Hepatobiliary: Unremarkable noncontrast appearance of the liver, gallbladder bile ducts. Pancreas: Not adequately visualized due to a general paucity of body fat. No obvious mass or ductal dilatation. Spleen: Unremarkable without contrast.  No splenomegaly. Adrenals/Urinary Tract: Stable 2.1 cm left adrenal nodule. No right adrenal lesions. Tandem proximal right ureteral stones are again noted measuring 5 mm and 3 mm, largest one on top of the smaller stone, both now at the level of the L4 superior endplate previously at the level the L3 lower plate having moved inferiorly no more than 1 cm. There is a 4 mm nonobstructive caliceal stone in the right lower pole, 3 mm nonobstructing stone in the left lower  pole collecting  system. A small cortical calcification in the outer left kidney is again shown. There is a 2.4 cm mass of the lower anterior left kidney of 33 Hounsfield units above the usual density of simple fluid, in 2018 was 1.9 cm. Ultrasound or MRI recommended to assess for internal complexity. The unenhanced renal cortex is otherwise unremarkable. No significant bladder thickening is seen. Stomach/Bowel: Not well evaluated due to lack of contrast and minimal body fat. The appendix is visible and unremarkable, no dilated or frankly thickened small bowel or stomach lining is seen. There is moderate to severe fecal stasis. There are colonic diverticula without wall thickening or adjacent inflammation. Vascular/Lymphatic: Aortic atherosclerosis. No enlarged abdominal or pelvic lymph nodes. Please note, assessment for adenopathy limited due to unopacified bowel and minimal body fat. Reproductive: The prostate is slightly enlarged. There are calcifications in the central prostate. Mild impression on the posterior bladder. Pelvic phleboliths. Other: Trace ascites in the posterior deep pelvis. No free air, hemorrhage or abscess , no ventral hernia. Musculoskeletal: Old fusion hardware, laminectomy and interbody disc space apparatus L4-5. Degenerative changes lumbar spine mild osteopenia. Bone graft harvest site chronically noted posterior left ilium. IMPRESSION: 1. Tandem 5 mm and 3 mm proximal right ureteral stones at the level of the superior endplate of L4, only about 1 cm more inferior to the position of the stones 4 days ago. 2. Nonobstructive nephrolithiasis. 3. 2.3 cm indeterminate low-density lesion, slightly larger than in 2018, low anterior left kidney. Most likely this is a hyperdense cyst but ultrasound or MRI follow-up recommended to assess for internal complexity. 4. Moderate to severe fecal stasis, with uncomplicated colonic diverticula. 5. Trace ascites. 6. Slightly prominent prostate. Electronically Signed   By:  Telford Nab M.D.   On: 02/21/2021 20:12    Procedures Procedures   Medications Ordered in ED Medications  HYDROmorphone (DILAUDID) injection 1 mg (1 mg Intravenous Given 02/23/21 1100)  ondansetron (ZOFRAN) injection 4 mg (4 mg Intravenous Given 02/23/21 1100)  sodium chloride 0.9 % bolus 1,000 mL (0 mLs Intravenous Stopped 02/23/21 0924)  morphine 4 MG/ML injection 4 mg (4 mg Intravenous Given 02/23/21 0805)  HYDROmorphone (DILAUDID) injection 1 mg (1 mg Intravenous Given 02/23/21 0926)  lactated ringers bolus 1,000 mL (1,000 mLs Intravenous New Bag/Given 02/23/21 1100)    ED Course  I have reviewed the triage vital signs and the nursing notes.  Pertinent labs & imaging results that were available during my care of the patient were reviewed by me and considered in my medical decision making (see chart for details).  Clinical Course as of 02/23/21 1121  Fri Feb 23, 2021  0801 Patient noted to be in pain, not answering questions fully. Will give patient pain meds and re-eval. [SB]  1601 Patient re-evaluated and noted to still be in pain following morphine. Discussed case with Attending who recommends dilaudid and urology consult [SB]  [phone] Consult with Urologist, Dr. Ezequiel Kayser who discussed this can either be treated with a stent or outpatient follow-up.  Dr. Gregery Na noted to notify him via secure chat the decision that patient makes. [SB]  [id number] Discussed with patient recommendations from Urologist, patient agreeable at this time for stent placement.  Secure chat sent to urologist. [SB]  1046 Consult with Hospitalist, Dr. Olevia Bowens who agrees to accept patient and admit at this time. [SB]    Clinical Course User Index [SB] Ethen Bannan A, PA-C   MDM Rules/Calculators/A&P  Patient presents to the ED with right flank pain x1 week.  He has been seen in the ED multiple times for his symptoms within the past week.  He had an appoint with urology, however unable to make  it due to increasing pain.  Vital signs stable, patient afebrile. On exam patient with right lower quadrant tenderness to palpation and right CVA tenderness.  Otherwise no acute cardiovascular, respiratory, abdominal exam findings.  Differential diagnosis includes acute cystitis, nephrolithiasis, pyelonephritis. CBC unremarkable.  BMP with hyperglycemia, BUN and creatinine stable.  Urinalysis notable for small amount of hemoglobin and ketones.  No signs of infection at this time.  CT renal stone study significant for unchanged positioning of 10.5 mm and 3 mm proximal right ureteral stones at the superior L4 level with continued moderate upstream hydroureteronephrosis.   Patient given morphine for initial pain control with no relief of his symptoms.  Patient given Dilaudid with improvement of his symptoms.  This is likely ureterolithiasis with nonobstructing nephrolithiasis.  Concerns due to increasing uncontrollable right flank pain since being seen 2 days ago in the ED. Case discussed with attending who recommends urology consult.  Consult with urologist, Dr. Rexene Alberts who recommends stent placement.  Discussed treatment plan with patient regarding stent placement, patient agreeable at this time.  Consult with hospitalist, Dr. Olevia Bowens, who accepts the patient in admission. COVID and flu swab ordered and pending at time of admission. Discussed with patient who agrees with admission at this time.    Final Clinical Impression(s) / ED Diagnoses Final diagnoses:  Right flank pain  Ureterolithiasis    Rx / DC Orders ED Discharge Orders     None        Mykiah Schmuck A, PA-C 02/23/21 1320    Jeanell Sparrow, DO 02/23/21 1635

## 2021-02-23 NOTE — ED Notes (Signed)
EMT here from OR to transport pt.

## 2021-02-23 NOTE — Telephone Encounter (Signed)
He is currently hospitalized so I will hold off on placing a referral as Urology may get to see him while he is there.  If he gets discharged and has no urology referral I am happy to place one.

## 2021-02-23 NOTE — Anesthesia Preprocedure Evaluation (Addendum)
Anesthesia Evaluation   Patient awake    Reviewed: Allergy & Precautions, NPO status , Patient's Chart, lab work & pertinent test results  Airway Mallampati: II  TM Distance: >3 FB Neck ROM: Full    Dental  (+) Edentulous Lower, Edentulous Upper   Pulmonary Current Smoker,    Pulmonary exam normal breath sounds clear to auscultation       Cardiovascular Exercise Tolerance: Good hypertension,  Rhythm:Regular Rate:Bradycardia     Neuro/Psych negative neurological ROS  negative psych ROS   GI/Hepatic GERD  ,(+)     substance abuse  cocaine use,   Endo/Other  diabeteshypercholesterolemia  Renal/GU Renal disease (kidney stone)     Musculoskeletal  (+) Arthritis , Osteoarthritis,    Abdominal   Peds negative pediatric ROS (+)  Hematology negative hematology ROS (+)   Anesthesia Other Findings   Reproductive/Obstetrics                            Anesthesia Physical Anesthesia Plan  ASA: 3 and emergent  Anesthesia Plan: General   Post-op Pain Management:    Induction: Intravenous  PONV Risk Score and Plan: 1 and Treatment may vary due to age or medical condition, Ondansetron, Dexamethasone and Midazolam  Airway Management Planned: Oral ETT  Additional Equipment: None  Intra-op Plan:   Post-operative Plan: Extubation in OR  Informed Consent: I have reviewed the patients History and Physical, chart, labs and discussed the procedure including the risks, benefits and alternatives for the proposed anesthesia with the patient or authorized representative who has indicated his/her understanding and acceptance.     Dental advisory given  Plan Discussed with: CRNA, Anesthesiologist and Surgeon  Anesthesia Plan Comments:         Anesthesia Quick Evaluation

## 2021-02-23 NOTE — ED Triage Notes (Addendum)
Patient BIB EMS with complaint of right flank pain. Diagnosed with kidney stone x 6 days ago. Pt states he has appointment with urology today but could not wait to be soon due to increasing pain.

## 2021-02-23 NOTE — H&P (Signed)
History and Physical    David Wyatt KXF:[id number] DOB: October 02, 1960 DOA: 02/23/2021  PCP: [name], MD   Patient coming from: Home.  I have personally briefly reviewed patient's old medical records in [place]  Chief Complaint: Right back pain.  HPI: David Wyatt is a 60 y.o. male with medical history significant of ACE inhibitor induced angioedema, osteoarthritis, chronic back pain, history of cocaine abuse, tobacco use, diabetic neuropathy, type II DM, hyperlipidemia, hypertension who is coming to the emergency department due to right flank pain for the fourth time in the past 7 days.  He denies dysuria, frequency or hematuria.  No fever, chills, but feels tired.  No rhinorrhea, sore throat, productive cough, wheezing or hemoptysis.  No chest pain, palpitations, diaphoresis, PND, orthopnea or pitting edema of the lower extremities.  Denied epigastric pain, nausea, emesis, diarrhea, constipation, melena or hematochezia.  He has had some polyuria, polydipsia, but no blurred vision  ED Course: Initial vital signs were temperature 97.9 F, pulse 67, respirations 20, BP 168/95 mmHg and O2 sat 100% on room air.  The patient received 1 L of NS bolus, 1 L of LR bolus, morphine 4 mg IVP and hydromorphone 1 mg IVP.  Lab work: Urinalysis showed glucosuria more than 500 and ketonuria 5 mg/dL.  There was small hemoglobinuria.  Rare bacteria microscopic semination.  CBC showed a white count of 9.1, hemoglobin 14.6 g/dL and platelets 421.  Sodium was 127 and chloride 95 minimal/L.  Potassium, CO2 and calcium were normal.  Glucose was 488, BUN 26 and creatinine 1.37 mg/dL.  Imaging: There was a kanban of 5 mm and 3 mm proximal right ureteral stones at the level of the superior endplate of L4 with continue upstream hydroureteronephrosis.  Please see images and full radiology report for further details.  Review of Systems: As per HPI otherwise all other systems reviewed and are negative.  Past  Medical History:  Diagnosis Date   Angioedema of lips 07/28/2012   left upper (07/29/2012)   Arthritis    hands and back   Chronic back pain    Cocaine abuse ([place])    Diabetic neuropathy (HCC)    High cholesterol    Hypertension    Neuropathy    Type II diabetes mellitus ([place])    Past Surgical History:  Procedure Laterality Date   BACK SURGERY     HERNIA REPAIR Right 04/01/2012   I & D EXTREMITY Left 12/13/2012   Procedure: IRRIGATION AND DEBRIDEMENT LEFT THUMB;  Surgeon: Tennis Must, MD;  Location: [place];  Service: Orthopedics;  Laterality: Left;   INCISION AND DRAINAGE OF WOUND     boil on back/notes 07/14/2008 (07/29/2012)   INGUINAL HERNIA REPAIR  04/01/2012   Procedure: HERNIA REPAIR INGUINAL ADULT;  Surgeon: Haywood Lasso, MD;  Location: [place];  Service: General;  Laterality: Right;   INGUINAL HERNIA REPAIR Left 09/06/2016   Procedure: OPEN REPAIR LEFT INGUINAL HERNIA;  Surgeon: Greer Pickerel, MD;  Location: [place];  Service: General;  Laterality: Left;   INSERTION OF MESH Left 09/06/2016   Procedure: INSERTION OF MESH;  Surgeon: Greer Pickerel, MD;  Location: [place];  Service: General;  Laterality: Left;   TONSILLECTOMY     Social History  reports that he has been smoking cigarettes. He has a 7.50 pack-year smoking history. He quit smokeless tobacco use about 5 years ago. He reports current drug use. Drug: "Crack" cocaine. He reports that he does not drink alcohol.  Allergies  Allergen Reactions   Lisinopril Other (See Comments)    Lip Swelling, angioedema   Family History  Problem Relation Age of Onset   Diabetes Mother    Kidney disease Mother    Hyperlipidemia Mother    Hyperlipidemia Father    Diabetes Father    Prior to Admission medications   Medication Sig Start Date End Date Taking? Authorizing Provider  amLODipine (NORVASC) 10 MG tablet Take 1 tablet (10 mg total) by mouth daily. 10/26/20  Yes Elsie Stain, MD  atorvastatin (LIPITOR) 40 MG tablet Take 1  tablet (40 mg total) by mouth daily. 10/26/20  Yes Elsie Stain, MD  cyclobenzaprine (FLEXERIL) 10 MG tablet Take 1 tablet (10 mg total) by mouth 2 (two) times daily as needed for muscle spasms. 12/26/20  Yes [name], MD  DULoxetine (CYMBALTA) 60 MG capsule Take 1 capsule (60 mg total) by mouth daily. 10/26/20  Yes Elsie Stain, MD  empagliflozin (JARDIANCE) 10 MG TABS tablet Take 1 tablet (10 mg total) by mouth daily before breakfast. 10/26/20  Yes Elsie Stain, MD  gabapentin (NEURONTIN) 300 MG capsule Take 2 capsules (600 mg total) by mouth 3 (three) times daily. 11/01/20  Yes Elsie Stain, MD  glipiZIDE (GLUCOTROL XL) 10 MG 24 hr tablet Take 1 tablet (10 mg total) by mouth daily with breakfast. 10/26/20  Yes Elsie Stain, MD  metFORMIN (GLUCOPHAGE) 1000 MG tablet Take 1 tablet (1,000 mg total) by mouth 2 (two) times daily. 12/26/20  Yes Newlin, Charlane Ferretti, MD  ondansetron (ZOFRAN) 4 MG tablet Take 1 tablet (4 mg total) by mouth every 6 (six) hours. Patient taking differently: Take 4 mg by mouth every 6 (six) hours as needed for nausea or vomiting. 02/17/21  Yes Valarie Merino, MD  Accu-Chek Softclix Lancets lancets Test blood sugar 2 times daily Patient taking differently: 1 each by Other route See admin instructions. Test blood sugar 2 times daily 11/03/20   Elsie Stain, MD  Blood Glucose Monitoring Suppl (ACCU-CHEK GUIDE) w/Device KIT USE AS DIRECTED TO CHECK BLOOD SUGARS UP TO TWICE PER DAY Patient taking differently: 1 each by Other route 2 (two) times daily. 02/17/19   [name], MD  Blood Glucose Monitoring Suppl (ACCU-CHEK GUIDE) w/Device KIT Use as directed Patient taking differently: 1 each by Other route 2 (two) times daily. 11/03/20   Elsie Stain, MD  fluticasone (FLONASE) 50 MCG/ACT nasal spray Place 2 sprays into both nostrils daily. Patient not taking: Reported on 02/23/2021 11/03/19   Argentina Donovan, PA-C  glucose blood test strip Check  blood sugar 2 times daily Patient taking differently: 1 each by Other route 2 (two) times daily. 11/03/20   Elsie Stain, MD  HYDROcodone-acetaminophen (NORCO/VICODIN) 5-325 MG tablet Take 1 tablet by mouth every 6 (six) hours as needed. Patient not taking: Reported on 02/23/2021 02/17/21   Valarie Merino, MD  Insulin Pen Needle 32G X 4 MM MISC Use with Victoza Patient taking differently: 1 each by Other route See admin instructions. Use with Victoza 11/03/20   Elsie Stain, MD  liraglutide (VICTOZA) 18 MG/3ML SOPN Inject 0.6 mg once daily Patient not taking: Reported on 12/30/2020 12/26/20   [name], MD  naproxen (NAPROSYN) 375 MG tablet Take 1 tablet (375 mg total) by mouth 2 (two) times daily. Patient not taking: Reported on 02/23/2021 12/18/20   Blanchie Dessert, MD  loratadine (CLARITIN) 10 MG tablet Take 1 tablet (10 mg total) by mouth  daily. Patient not taking: No sig reported 06/03/17 07/20/20  [name], MD   Physical Exam: Vitals:   02/23/21 1000 02/23/21 1015 02/23/21 1030 02/23/21 1100  BP: (!) 183/98  (!) 170/88 (!) 180/96  Pulse: 61  (!) 55 67  Resp:   18   Temp:      TempSrc:      SpO2: 97%  95% 97%  Weight:  68 kg    Height:  5' 8"  (1.727 m)     Constitutional: NAD, calm, comfortable Eyes: PERRL, lids and conjunctivae normal ENMT: Mucous membranes are moist. Posterior pharynx clear of any exudate or lesions. Neck: normal, supple, no masses, no thyromegaly Respiratory: clear to auscultation bilaterally, no wheezing, no crackles. Normal respiratory effort. No accessory muscle use.  Cardiovascular: Regular rate and rhythm, no murmurs / rubs / gallops. No extremity edema. 2+ pedal pulses. No carotid bruits.  Abdomen: Right CVA and RLQ tenderness.  Soft, no tenderness, no guarding or rebound, no masses palpated. No hepatosplenomegaly. Bowel sounds positive.  Musculoskeletal: no clubbing / cyanosis. Good ROM, no contractures. Normal muscle tone.   Skin: no rashes, lesions, ulcers on limited dermatological examination. Neurologic: CN 2-12 grossly intact. Sensation intact, DTR normal. Strength 5/5 in all 4.  Psychiatric: Normal judgment and insight. Alert and oriented x 3. Normal mood.   Labs on Admission: I have personally reviewed following labs and imaging studies  CBC: Recent Labs  Lab 02/17/21 0433 02/17/21 0456 02/17/21 1301 02/19/21 1052 02/21/21 1258 02/23/21 0548  WBC 9.2  --  9.6 13.1* 5.6 9.1  NEUTROABS 7.4  --  7.2 11.0* 3.8  --   HGB 15.2 17.3*   16.3 17.4* 15.7 15.8 14.6  HCT 44.4 51.0   48.0 51.6 45.3 46.4 43.6  MCV 91.4  --  92.5 90.8 92.2 92.4  PLT 387  --  376 358 404* 161*   Basic Metabolic Panel: Recent Labs  Lab 02/17/21 0433 02/17/21 0456 02/17/21 1301 02/19/21 1052 02/21/21 1258 02/23/21 0548  NA 126* 127*   127* 132* 130* 131* 127*  K 4.7 4.9   4.8 5.2* 4.8 4.7 4.1  CL 92* 93* 94* 96* 96* 95*  CO2 22  --  24 21* 23 23  GLUCOSE 648* 632* 407* 332* 349* 488*  BUN 24* 31* 26* 31* 27* 26*  CREATININE 1.60* 1.50* 1.54* 1.66* 1.14 1.37*  CALCIUM 9.3  --  10.0 9.1 9.2 9.2   GFR: Estimated Creatinine Clearance: 54.5 mL/min (A) (by C-G formula based on SCr of 1.37 mg/dL (H)).  Liver Function Tests: Recent Labs  Lab 02/17/21 0433 02/17/21 1301  AST 23 23  ALT 23 25  ALKPHOS 123 125  BILITOT 0.9 1.8*  PROT 7.7 9.4*  ALBUMIN 3.8 4.8   Urine analysis:    Component Value Date/Time   COLORURINE YELLOW 02/23/2021 [place] 02/23/2021 0548   LABSPEC 1.010 02/23/2021 0548   PHURINE 6.0 02/23/2021 0548   GLUCOSEU >=500 (A) 02/23/2021 0548   HGBUR SMALL (A) 02/23/2021 0548   BILIRUBINUR NEGATIVE 02/23/2021 0548        KETONESUR 5 (A) 02/23/2021 0548   PROTEINUR NEGATIVE 02/23/2021 0548   UROBILINOGEN 0.2 02/16/2019 1618        NITRITE NEGATIVE 02/23/2021 0548   LEUKOCYTESUR NEGATIVE 02/23/2021 0548    Radiological Exams on Admission: DG Abdomen 1 View  Result  Date: 02/21/2021 CLINICAL DATA:  Right-sided flank pain and constipation EXAM: ABDOMEN - 1 VIEW COMPARISON:  02/17/2021  FINDINGS: The amount of gas and stool within the colon is within the range of normal. Small bowel pattern is normal. Small bilateral renal calculi remain visible. Previous lumbosacral discectomy and fusion. IMPRESSION: Gas pattern within normal limits presently. Small renal calculi. Electronically Signed   By: Nelson Chimes M.D.   On: 02/21/2021 13:13   CT RENAL STONE STUDY  Result Date: 02/23/2021 CLINICAL DATA:  Right flank pain with kidney stone. Increasing pain. EXAM: CT ABDOMEN AND PELVIS WITHOUT CONTRAST TECHNIQUE: Multidetector CT imaging of the abdomen and pelvis was performed following the standard protocol without IV contrast. COMPARISON:  CT without contrast 02/21/2021. FINDINGS: Lower chest: No acute abnormality. Hepatobiliary: No focal liver abnormality is seen. No gallstones, gallbladder wall thickening, or biliary dilatation. Pancreas: Not well seen due to the patient having very low body fat. No obvious mass or ductal dilatation. Spleen: Unremarkable. Adrenals/Urinary Tract: 2.1 cm left adrenal stable nodule. Normal right adrenal gland. There is continued moderate right hydroureteronephrosis above 2 tandem right ureteral stones measuring 5 mm and 3 mm at the level of the L4 superior endplate, unchanged. No other ureteral stones are seen. Nonobstructive nephrolithiasis within both kidneys is unchanged. 2.4 cm indeterminate mass again noted low anterior left kidney. Mild bladder thickening versus underdistention. Stomach/Bowel: Also not well seen due to lack of contrast and body fat. No small bowel dilatation is evident. Moderate to severe retained stool is again shown, normal appendix and no appreciable wall thickening of the colon. Scattered sigmoid diverticula. Vascular/Lymphatic: Aortic atherosclerosis. No enlarged abdominal or pelvic lymph nodes. Reproductive: Slight  prostatomegaly with central calcifications. Pelvic phleboliths. Other: Trace posterior pelvic ascites unchanged. Musculoskeletal: Degenerative and postsurgical change of the spine. IMPRESSION: 1. Unchanged positioning of tandem 5 mm and 3 mm proximal right ureteral stones at the superior L4 level, with continued moderate upstream hydroureteronephrosis. Correlate clinically for infectious complication. 2. Additional nonobstructive nephrolithiasis and stable left adrenal nodule. 3. 2.3 cm indeterminate low-density left renal lesion. MRI or ultrasound follow-up recommended. 1. Constipation. 2. Persistent trace ascites. 3. Prostatomegaly with mild bladder thickening versus nondistention. Electronically Signed   By: Telford Nab M.D.   On: 02/23/2021 05:57   CT Renal Stone Study  Result Date: 02/21/2021 CLINICAL DATA:  Right flank pain with recent CT showing a right ureteral stone. EXAM: CT ABDOMEN AND PELVIS WITHOUT CONTRAST TECHNIQUE: Multidetector CT imaging of the abdomen and pelvis was performed following the standard protocol without IV contrast. COMPARISON:  Recent CT no contrast 02/17/2021, older CT with contrast 06/16/2016 FINDINGS: Lower chest: There are few linear scar-like opacities in the lung bases without infiltrates. The cardiac size is normal. Hepatobiliary: Unremarkable noncontrast appearance of the liver, gallbladder bile ducts. Pancreas: Not adequately visualized due to a general paucity of body fat. No obvious mass or ductal dilatation. Spleen: Unremarkable without contrast.  No splenomegaly. Adrenals/Urinary Tract: Stable 2.1 cm left adrenal nodule. No right adrenal lesions. Tandem proximal right ureteral stones are again noted measuring 5 mm and 3 mm, largest one on top of the smaller stone, both now at the level of the L4 superior endplate previously at the level the L3 lower plate having moved inferiorly no more than 1 cm. There is a 4 mm nonobstructive caliceal stone in the right lower  pole, 3 mm nonobstructing stone in the left lower pole collecting system. A small cortical calcification in the outer left kidney is again shown. There is a 2.4 cm mass of the lower anterior left kidney of 33 Hounsfield units above  the usual density of simple fluid, in 2018 was 1.9 cm. Ultrasound or MRI recommended to assess for internal complexity. The unenhanced renal cortex is otherwise unremarkable. No significant bladder thickening is seen. Stomach/Bowel: Not well evaluated due to lack of contrast and minimal body fat. The appendix is visible and unremarkable, no dilated or frankly thickened small bowel or stomach lining is seen. There is moderate to severe fecal stasis. There are colonic diverticula without wall thickening or adjacent inflammation. Vascular/Lymphatic: Aortic atherosclerosis. No enlarged abdominal or pelvic lymph nodes. Please note, assessment for adenopathy limited due to unopacified bowel and minimal body fat. Reproductive: The prostate is slightly enlarged. There are calcifications in the central prostate. Mild impression on the posterior bladder. Pelvic phleboliths. Other: Trace ascites in the posterior deep pelvis. No free air, hemorrhage or abscess , no ventral hernia. Musculoskeletal: Old fusion hardware, laminectomy and interbody disc space apparatus L4-5. Degenerative changes lumbar spine mild osteopenia. Bone graft harvest site chronically noted posterior left ilium. IMPRESSION: 1. Tandem 5 mm and 3 mm proximal right ureteral stones at the level of the superior endplate of L4, only about 1 cm more inferior to the position of the stones 4 days ago. 2. Nonobstructive nephrolithiasis. 3. 2.3 cm indeterminate low-density lesion, slightly larger than in 2018, low anterior left kidney. Most likely this is a hyperdense cyst but ultrasound or MRI follow-up recommended to assess for internal complexity. 4. Moderate to severe fecal stasis, with uncomplicated colonic diverticula. 5. Trace  ascites. 6. Slightly prominent prostate. Electronically Signed   By: Telford Nab M.D.   On: 02/21/2021 20:12    EKG: Independently reviewed.   Assessment/Plan Principal Problem:   Hydronephrosis of right kidney Observation/MedSurg. Keep NPO. Continue IV fluids. Analgesics as needed. Antiemetics as needed. No signs of infection/antibiotics deferred at this time. MRI to kidney to evaluate indeterminate mass. Check metanephrines and cortisol.  Active Problems:   Type 2 diabetes mellitus with hyperglycemia (HCC) Currently NPO. Continue IV fluids. RI 5 units x 1 dose. Carbohydrate modified diet postop. CBG monitoring with RI SS once cleared for oral intake.    Chronic back pain Analgesics as needed. Muscle relaxants as needed.    Essential hypertension, benign Resume amlodipine 10 mg p.o. daily.    Diabetic neuropathy ([place]) Has not been taking gabapentin.    Hyperlipidemia Currently not on any medication.    DVT prophylaxis: SCDs. Code Status:   Full code. Family Communication:   Disposition Plan:   Patient is from:  Home.  Anticipated DC to:  Home.  Anticipated DC date:  02/24/2021 or 02/25/2021.  Anticipated DC barriers: Clinical status.  Consults called:  Urology Rexene Alberts, MD). Admission status:  Observation/MedSurg.    Severity of Illness:High severity due to development of right hydronephrosis secondary to nephrolithiasis.  The patient will need to be seen by urology for a stone removal and urinary stent placement.  Reubin Milan MD [facility]  How to contact the [facility] Attending or Consulting provider [place] or covering provider during after hours [place], for this patient?   Check the care team in [facility] and look for a) attending/consulting [facility] provider listed and b) the [facility] team listed Log into [email or web address] and use [place]'s universal password to access. If you do not have the password, please contact the hospital operator. Locate the  [facility] provider you are looking for under [facility] and page to a number that you can be directly reached. If you still have difficulty reaching  the provider, please page the [facility] (Director on Call) for the Hospitalists listed on amion for assistance.  02/23/2021, 12:24 PM   This document was prepared using [employer] and may contain some unintended transcription errors.

## 2021-02-23 NOTE — Anesthesia Postprocedure Evaluation (Signed)
Anesthesia Post Note  Patient: Gianmarco Roye  Procedure(s) Performed: CYSTOSCOPY WITH RETROGRADE PYELOGRAM/URETERAL STENT PLACEMENT (Right)     Patient location during evaluation: PACU Anesthesia Type: General Level of consciousness: awake and alert Pain management: pain level controlled Vital Signs Assessment: post-procedure vital signs reviewed and stable Respiratory status: spontaneous breathing, nonlabored ventilation, respiratory function stable and patient connected to nasal cannula oxygen Cardiovascular status: blood pressure returned to baseline and stable Postop Assessment: no apparent nausea or vomiting Anesthetic complications: no   No notable events documented.  Last Vitals:  Vitals:   02/23/21 1719 02/23/21 1730  BP: (!) 151/97 (!) 147/88  Pulse: 94 81  Resp: 19 17  Temp: 36.5 C   SpO2: 98% 98%    Last Pain:  Vitals:   02/23/21 1730  TempSrc:   PainSc: 0-No pain                 Merlinda Frederick

## 2021-02-23 NOTE — ED Notes (Signed)
Attempted blood pressure several times. Pt continuously moving and unable to remain still. Pt informed of the need for accurate bp. Will reevaluate upon return from the bathroom.

## 2021-02-24 ENCOUNTER — Encounter (HOSPITAL_COMMUNITY): Payer: Self-pay | Admitting: Urology

## 2021-02-24 DIAGNOSIS — I10 Essential (primary) hypertension: Secondary | ICD-10-CM

## 2021-02-24 DIAGNOSIS — E0842 Diabetes mellitus due to underlying condition with diabetic polyneuropathy: Secondary | ICD-10-CM

## 2021-02-24 DIAGNOSIS — N1330 Unspecified hydronephrosis: Secondary | ICD-10-CM | POA: Diagnosis not present

## 2021-02-24 LAB — CBC
HCT: 40.6 % (ref 39.0–52.0)
Hemoglobin: 13.6 g/dL (ref 13.0–17.0)
MCH: 30.6 pg (ref 26.0–34.0)
MCHC: 33.5 g/dL (ref 30.0–36.0)
MCV: 91.2 fL (ref 80.0–100.0)
Platelets: 435 10*3/uL — ABNORMAL HIGH (ref 150–400)
RBC: 4.45 MIL/uL (ref 4.22–5.81)
RDW: 12.1 % (ref 11.5–15.5)
WBC: 7.4 10*3/uL (ref 4.0–10.5)
nRBC: 0 % (ref 0.0–0.2)

## 2021-02-24 LAB — COMPREHENSIVE METABOLIC PANEL
ALT: 13 U/L (ref 0–44)
AST: 14 U/L — ABNORMAL LOW (ref 15–41)
Albumin: 3.1 g/dL — ABNORMAL LOW (ref 3.5–5.0)
Alkaline Phosphatase: 74 U/L (ref 38–126)
Anion gap: 10 (ref 5–15)
BUN: 20 mg/dL (ref 8–23)
CO2: 24 mmol/L (ref 22–32)
Calcium: 9 mg/dL (ref 8.9–10.3)
Chloride: 99 mmol/L (ref 98–111)
Creatinine, Ser: 0.93 mg/dL (ref 0.61–1.24)
GFR, Estimated: >60 mL/min (ref >60)
Glucose, Bld: 360 mg/dL — ABNORMAL HIGH (ref 70–99)
Potassium: 4.8 mmol/L (ref 3.5–5.1)
Sodium: 133 mmol/L — ABNORMAL LOW (ref 135–145)
Total Bilirubin: 0.6 mg/dL (ref 0.3–1.2)
Total Protein: 7 g/dL (ref 6.5–8.1)

## 2021-02-24 LAB — CORTISOL-AM, BLOOD: Cortisol - AM: 3.2 ug/dL — ABNORMAL LOW (ref 6.7–22.6)

## 2021-02-24 LAB — GLUCOSE, CAPILLARY: Glucose-Capillary: 306 mg/dL — ABNORMAL HIGH (ref 70–99)

## 2021-02-24 LAB — HIV ANTIBODY (ROUTINE TESTING W REFLEX): HIV Screen 4th Generation wRfx: NONREACTIVE

## 2021-02-24 MED ORDER — INSULIN ASPART 100 UNIT/ML IJ SOLN
0.0000 [IU] | Freq: Three times a day (TID) | INTRAMUSCULAR | Status: DC
  Administered 2021-02-25: 13:00:00 2 [IU] via SUBCUTANEOUS
  Administered 2021-02-25: 09:00:00 11 [IU] via SUBCUTANEOUS

## 2021-02-24 MED ORDER — INSULIN ASPART 100 UNIT/ML IJ SOLN
0.0000 [IU] | Freq: Every day | INTRAMUSCULAR | Status: DC
  Administered 2021-02-24: 4 [IU] via SUBCUTANEOUS

## 2021-02-24 NOTE — Progress Notes (Signed)
PROGRESS NOTE    David Wyatt  KLK:[id number] DOB: [birth year] DOA: 02/23/2021 PCP: [name], MD    Brief Narrative:   60 y.o. male with medical history significant of ACE inhibitor induced angioedema, osteoarthritis, chronic back pain, history of cocaine abuse, tobacco use, diabetic neuropathy, type II DM, hyperlipidemia, hypertension who is coming to the emergency department due to right flank pain for the fourth time in the past 7 days.Pt was found to have ureteral stones on imaging.  Urology was consulted.  Assessment & Plan:   Principal Problem:   Hydronephrosis of right kidney Active Problems:   Chronic back pain   Essential hypertension, benign   Diabetic neuropathy ([place])   Hyperlipidemia   Type 2 diabetes mellitus with hyperglycemia (HCC)  Principal Problem:   Hydronephrosis of right kidney Pt is continued on IVF Continue with analgesics as needed Antiemetics as needed. No signs of infection/antibiotics deferred at this time. MRI to kidney reviewed, findings concerning for renal mass, see below   Active Problems:   Type 2 diabetes mellitus with hyperglycemia (HCC) Continue IV fluids. Carbohydrate modified diet postop. Continue sliding scale insulin     Chronic back pain Analgesics as needed. Muscle relaxants as needed. Discussed with nursing, concerns for unsteadiness on feet.  Staff concerned that patient will be considered possible fall risk Will consult physical therapy     Essential hypertension, benign Continue amlodipine 10 mg p.o. daily.     Diabetic neuropathy ([place]) Has not been taking gabapentin.     Hyperlipidemia Currently not on any medication.  Renal mass -MRI reviewed.  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 -Would have patient follow-up closely with urology  DVT prophylaxis: SCD's Code Status: Full Family  Communication: Pt in room, family not at bedside  Status is: Observation  The patient remains OBS appropriate and will d/c before 2 midnights.  Consultants:  Urology  Procedures:  Status post cystoscopy, right retrograde pyelogram, right ureteral stent placement  Antimicrobials: Anti-infectives (From admission, onward)    None       Subjective: Complaining of continued discomfort  Objective: Vitals:   02/24/21 0150 02/24/21 0452 02/24/21 0933 02/24/21 1233  BP: (!) 151/83 (!) 164/83 134/79 (!) 152/92  Pulse: 69 64 65 81  Resp: 18 18 18 18   Temp: 98 F (36.7 C) 97.7 F (36.5 C) 98.1 F (36.7 C)   TempSrc: Oral Oral Oral   SpO2: 95% 99% 99% 100%  Weight:      Height:        Intake/Output Summary (Last 24 hours) at 02/24/2021 1657 Last data filed at 02/24/2021 1254 Gross per 24 hour  Intake 4806.48 ml  Output 800 ml  Net 4006.48 ml   Filed Weights   02/23/21 1015  Weight: 68 kg    Examination: General exam: Awake, laying in bed, in nad Respiratory system: Normal respiratory effort, no wheezing Cardiovascular system: regular rate, s1, s2 Gastrointestinal system: Soft, nondistended, positive BS Central nervous system: CN2-12 grossly intact, strength intact Extremities: Perfused, no clubbing Skin: Normal skin turgor, no notable skin lesions seen Psychiatry: Mood normal // no visual hallucinations   Data Reviewed: I have personally reviewed following labs and imaging studies  CBC: Recent Labs  Lab 02/19/21 1052 02/21/21 1258 02/23/21 0548 02/24/21 0524  WBC 13.1* 5.6 9.1 7.4  NEUTROABS 11.0* 3.8  --   --   HGB 15.7  15.8 14.6 13.6  HCT 45.3 46.4 43.6 40.6  MCV 90.8 92.2 92.4 91.2  PLT 358 404* 421* 412*   Basic Metabolic Panel: Recent Labs  Lab 02/19/21 1052 02/21/21 1258 02/23/21 0548 02/24/21 0524  NA 130* 131* 127* 133*  K 4.8 4.7 4.1 4.8  CL 96* 96* 95* 99  CO2 21* 23 23 24   GLUCOSE 332* 349* 488* 360*  BUN 31* 27* 26* 20   CREATININE 1.66* 1.14 1.37* 0.93  CALCIUM 9.1 9.2 9.2 9.0   GFR: Estimated Creatinine Clearance: 80.2 mL/min (by C-G formula based on SCr of 0.93 mg/dL). Liver Function Tests: Recent Labs  Lab 02/24/21 0524  AST 14*  ALT 13  ALKPHOS 74  BILITOT 0.6  PROT 7.0  ALBUMIN 3.1*   No results for input(s): LIPASE, AMYLASE in the last 168 hours. No results for input(s): AMMONIA in the last 168 hours. Coagulation Profile: No results for input(s): INR, PROTIME in the last 168 hours. Cardiac Enzymes: No results for input(s): CKTOTAL, CKMB, CKMBINDEX, TROPONINI in the last 168 hours. BNP (last 3 results) No results for input(s): PROBNP in the last 8760 hours. HbA1C: No results for input(s): HGBA1C in the last 72 hours. CBG: Recent Labs  Lab 02/21/21 2147 02/21/21 2259 02/23/21 1127 02/23/21 1552 02/23/21 1728  GLUCAP 495* 270* 321* 132* 176*   Lipid Profile: No results for input(s): CHOL, HDL, LDLCALC, TRIG, CHOLHDL, LDLDIRECT in the last 72 hours. Thyroid Function Tests: No results for input(s): TSH, T4TOTAL, FREET4, T3FREE, THYROIDAB in the last 72 hours. Anemia Panel: No results for input(s): VITAMINB12, FOLATE, FERRITIN, TIBC, IRON, RETICCTPCT in the last 72 hours. Sepsis Labs: No results for input(s): PROCALCITON, LATICACIDVEN in the last 168 hours.  Recent Results (from the past 240 hour(s))  Resp Panel by RT-PCR (Flu A&B, Covid) Nasopharyngeal Swab     Status: None   Collection Time: 02/23/21  1:21 PM   Specimen: Nasopharyngeal Swab; Nasopharyngeal(NP) swabs in vial transport medium  Result Value Ref Range Status   SARS Coronavirus 2 by RT PCR NEGATIVE NEGATIVE Final    Comment: (NOTE) SARS-CoV-2 target nucleic acids are NOT DETECTED.  The SARS-CoV-2 RNA is generally detectable in upper respiratory specimens during the acute phase of infection. The lowest concentration of SARS-CoV-2 viral copies this assay can detect is 138 copies/mL. A negative result does not  preclude SARS-Cov-2 infection and should not be used as the sole basis for treatment or other patient management decisions. A negative result may occur with  improper specimen collection/handling, submission of specimen other than nasopharyngeal swab, presence of viral mutation(s) within the areas targeted by this assay, and inadequate number of viral copies(<138 copies/mL). A negative result must be combined with clinical observations, patient history, and epidemiological information. The expected result is Negative.  Fact Sheet for Patients:  EntrepreneurPulse.com.au  Fact Sheet for Healthcare Providers:  IncredibleEmployment.be  This test is no t yet approved or cleared by the [place] FDA and  has been authorized for detection and/or diagnosis of SARS-CoV-2 by FDA under an Emergency Use Authorization (EUA). This EUA will remain  in effect (meaning this test can be used) for the duration of the COVID-19 declaration under Section 564(b)(1) of the Act, 21 U.S.C.section 360bbb-3(b)(1), unless the authorization is terminated  or revoked sooner.       Influenza A by PCR NEGATIVE NEGATIVE Final   Influenza B by PCR NEGATIVE NEGATIVE Final    Comment: (NOTE) The Xpert Xpress SARS-CoV-2/FLU/RSV plus assay is intended as  an aid in the diagnosis of influenza from Nasopharyngeal swab specimens and should not be used as a sole basis for treatment. Nasal washings and aspirates are unacceptable for Xpert Xpress SARS-CoV-2/FLU/RSV testing.  Fact Sheet for Patients: EntrepreneurPulse.com.au  Fact Sheet for Healthcare Providers: IncredibleEmployment.be  This test is not yet approved or cleared by the [place] FDA and has been authorized for detection and/or diagnosis of SARS-CoV-2 by FDA under an Emergency Use Authorization (EUA). This EUA will remain in effect (meaning this test can be used) for the  duration of the COVID-19 declaration under Section 564(b)(1) of the Act, 21 U.S.C. section 360bbb-3(b)(1), unless the authorization is terminated or revoked.  Performed at [facility], [employer] [address], [place], [place] [address]      Radiology Studies: MR ABDOMEN W WO CONTRAST  Result Date: 02/23/2021 CLINICAL DATA:  Indeterminate renal mass EXAM: MRI ABDOMEN WITHOUT AND WITH CONTRAST TECHNIQUE: Multiplanar multisequence MR imaging of the abdomen was performed both before and after the administration of intravenous contrast. CONTRAST:  66mL GADAVIST GADOBUTROL 1 MMOL/ML IV SOLN COMPARISON:  CT 02/23/2021, CT 06/16/2016 FINDINGS: Lower chest: Mild cardiomegaly.  Bibasilar atelectasis. Hepatobiliary: No mass or other parenchymal abnormality identified. No intra or extrahepatic biliary ductal dilation. Gallbladder unremarkable. Pancreas: No mass, inflammatory changes, or other parenchymal abnormality identified. Spleen:  Within normal limits in size and appearance. Adrenals/Urinary Tract: The adrenal glands are unremarkable. The kidneys are normal in size and position. No hydronephrosis. The lesion in question within the medial, lower pole of the left kidney represents a 3.2 x 2.9 cm mass which demonstrates heterogeneous areas of low signal intensity on T1 and T2 weighted imaging suggesting internal hemorrhagic components, heterogeneous areas of internal enhancement, best appreciated on delayed phase imaging, and subtle areas of restricted diffusion internally. Altogether, this is most in keeping with a solid renal mass and a primary renal neoplasm is suspected. This demonstrates interval increase in size since remote priorl examination of 06/16/2016 where this measured 2.1 cm in greatest dimension. No involvement of the adjacent left renal pelvicalyceal system. No involvement of the left renal vein. No adjacent pathologic adenopathy. 16 mm intracortical lesion demonstrates high T2 and  low T1 intrinsic signal intensity without restricted diffusion or significant enhancement is most compatible with a simple cortical cyst. Multiple additional tiny simple cortical cysts are identified bilaterally. No enhancing intrarenal masses are identified. Proximal portion of a double-J ureteral stent identified within the right renal pelvis which appears minimally distended. Stomach/Bowel: Visualized portions within the abdomen are unremarkable. Vascular/Lymphatic: No pathologically enlarged lymph nodes identified. No abdominal aortic aneurysm demonstrated. Other: Artifact related to spinal fusion hardware noted within the lower lumbar spine. Musculoskeletal: No suspicious bone lesions identified. 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 No involvement of the left renal collecting system or left renal vein. No adjacent pathologic adenopathy. Electronically Signed   By: Fidela Salisbury M.D.   On: 02/23/2021 19:40   DG C-Arm 1-60 Min-No Report  Result Date: 02/23/2021 Fluoroscopy was utilized by the requesting physician.  No radiographic interpretation.   CT RENAL STONE STUDY  Result Date: 02/23/2021 CLINICAL DATA:  Right flank pain with kidney stone. Increasing pain. EXAM: CT ABDOMEN AND PELVIS WITHOUT CONTRAST TECHNIQUE: Multidetector CT imaging of the abdomen and pelvis was performed following the standard protocol without IV contrast. COMPARISON:  CT without contrast 02/21/2021. FINDINGS: Lower chest: No acute abnormality. Hepatobiliary:  No focal liver abnormality is seen. No gallstones, gallbladder wall thickening, or biliary dilatation. Pancreas: Not well seen due to the patient having very low body fat. No obvious mass or ductal dilatation. Spleen: Unremarkable. Adrenals/Urinary Tract: 2.1 cm left adrenal stable nodule. Normal right adrenal gland. There is continued moderate right  hydroureteronephrosis above 2 tandem right ureteral stones measuring 5 mm and 3 mm at the level of the L4 superior endplate, unchanged. No other ureteral stones are seen. Nonobstructive nephrolithiasis within both kidneys is unchanged. 2.4 cm indeterminate mass again noted low anterior left kidney. Mild bladder thickening versus underdistention. Stomach/Bowel: Also not well seen due to lack of contrast and body fat. No small bowel dilatation is evident. Moderate to severe retained stool is again shown, normal appendix and no appreciable wall thickening of the colon. Scattered sigmoid diverticula. Vascular/Lymphatic: Aortic atherosclerosis. No enlarged abdominal or pelvic lymph nodes. Reproductive: Slight prostatomegaly with central calcifications. Pelvic phleboliths. Other: Trace posterior pelvic ascites unchanged. Musculoskeletal: Degenerative and postsurgical change of the spine. IMPRESSION: 1. Unchanged positioning of tandem 5 mm and 3 mm proximal right ureteral stones at the superior L4 level, with continued moderate upstream hydroureteronephrosis. Correlate clinically for infectious complication. 2. Additional nonobstructive nephrolithiasis and stable left adrenal nodule. 3. 2.3 cm indeterminate low-density left renal lesion. MRI or ultrasound follow-up recommended. 1. Constipation. 2. Persistent trace ascites. 3. Prostatomegaly with mild bladder thickening versus nondistention. Electronically Signed   By: Telford Nab M.D.   On: 02/23/2021 05:57    Scheduled Meds:  amLODipine  10 mg Oral Daily   glipiZIDE  10 mg Oral Q breakfast   metFORMIN  1,000 mg Oral BID WC   Continuous Infusions:  sodium chloride 100 mL/hr at 02/24/21 9163     LOS: 0 days   Marylu Lund, MD [facility] Pager On Amion  If 7PM-7AM, please contact night-coverage 02/24/2021, 4:57 PM

## 2021-02-24 NOTE — Discharge Instructions (Addendum)
[facility], multimedia. Comments  [facility] [address]  [place] [phone]  DV [place], [place], [place] [address] [phone]  Men/Woman  [facility] [place] [phone]  DV Shelter  [facility] [phone] N. [place] [phone]  Families' w/Children   [facility] [address] [phone]  Men/Women/Families   [facility] [place].  [place] [phone] Men and Women  [facility] [place] [phone]    pregnant/parenting girls and women  [place] [phone]  ages [place] [phone]  Youth ages 11-17  [facility] [address] [phone]  Men  [place] [place]  [place] ([address]  [place] [phone]  Single Women and Women w/Children  [employer] [address] [phone] Men/Women/Families   Family [place] [phone]  DV Shelter  [facility] [address]  [place] [phone] Men and Women  [employer] [address] [place] [phone] Men  [facility] [place] [phone] [place] [phone]  Single Women and Families  Room at the [facility]. [place] [phone] or  [phone]  Pregnant Women  [facility] [place].  [place]  [phone]  Men/Women and Families           Outpatient Substance Use Treatment [facility]  [facility] [address] [place], [place] [address]  [phone] Private  insurance, Medicare A&B, and [facility]   ADS (Alcohol and Drug Services)  [address]  [place], [place] [address] [phone] Medicaid, [place] [address] [place]  [place], [place] Medicaid and [facility], Self Pay   [facility] [address]  [place], [place] [facility], and Self Pay  [facility] [place]    [place], [place] [address]  [phone] or [phone] Private Insurance Only   [facility] [place] [facility] [address]  [place], [place] [phone] Medicaid, Medicare, [place] at the [facility] [address] [place], [place] [address] [phone] Services are free or reduced  [facility]  [address] [phone]  Self Pay only, sliding scale  [facility]  [address]  [place], [place] [address] [phone] ([facility]) Self Pay, Medicaid Only   [facility] [place], [place] [address] [phone] Medicaid, Medicare, [place]  Residential Substance Use Treatment Services   [facility] ([place]  [address]  [place],  [address]  [phone] or [phone] Detox (Medicare, Medicaid, private insurance, and self pay)  Residential Rehab 14 days (Medicare, [place], private insurance, and self pay)   RTS (Residential Treatment Services)  [place], [place]  Male and Male Detox (  Self Pay and Medicaid limited availability)  Rehab only Male (Medicaid and self pay only)   [facility] [place]  [place], [place] [address]  [phone] or [phone] Detox and [place]  [place].  [place], [place] [address]  [phone]   Treatment Only, must make assessment appointment, and must be sober for assessment appointment.  Self Pay Only, Medicare A&B, [facility], [place] ID only! *Transportation assistance offered from [place] on [address] [place], [place] [address] Walk in interviews M-Sat 8-4p No pending legal charges [phone]  ADATC:  [facility] Referral  [address] [place], [place] (Self Pay, [facility])  [facility] [address] [place], [place] [address] [phone] Detox and Residential Treatment Medicare and [place].  [place], [place] [address] [place]: [place]: [phone] Long-term Residential Program:  [phone] Males 25 and Over (No Insurance, upfront fee)  [place], [place] [address] [phone] Private Insurance with [place], [place] [employer], [place] [address] Local ([place].  [place], [place] [address]  [phone] (Males, upfront fee)  [place] [facility] [place]  [place], [place] Locations  [facility]  [address]  [place], [place] Dar[place] Based Program for individuals experiencing homelessness Self Pay, No insurance  [place]  Men's program: [facility] [address] [place], [place] [address] [phone]  [place] Women's program: [facility] [address] [place], [place] [address] [phone] Christian Based Program for individuals experiencing homelessness Self Pay, No insurance  [facility] Men's Division [address] [place], [place] [address]  [place] for individuals experiencing  homelessness Self Pay, No insurance  [facility] Women's Division [place], [place] [address] [place] for individuals experiencing homelessness Self Pay, No insurance  [facility] [place], [place] for males experiencing homelessness Self Pay, No insurance

## 2021-02-24 NOTE — Progress Notes (Signed)
1 Day Post-Op Subjective: Denies abdominal pain.  No nausea or emesis.  Tolerating diet.  Voiding without difficulty.  Objective: Vital signs in last 24 hours: Temp:  [97.5 F (36.4 C)-98.2 F (36.8 C)] 97.7 F (36.5 C) (12/17 0452) Pulse Rate:  [50-94] 64 (12/17 0452) Resp:  [16-20] 18 (12/17 0452) BP: (133-191)/(74-98) 164/83 (12/17 0452) SpO2:  [91 %-100 %] 99 % (12/17 0452) Weight:  [68 kg] 68 kg (12/16 1015)  Intake/Output from previous day: 12/16 0701 - 12/17 0700 In: 4966.5 [P.O.:2280; I.V.:1686.5; IV Piggyback:1000] Out: 200 [Urine:200] Intake/Output this shift: Total I/O In: 240 [P.O.:240] Out: -   Physical Exam:  General: Alert and oriented CV: RRR Lungs: Clear Abdomen: Soft, ND, NT Ext: NT, No erythema  Lab Results: Recent Labs    02/21/21 1258 02/23/21 0548 02/24/21 0524  HGB 15.8 14.6 13.6  HCT 46.4 43.6 40.6   BMET Recent Labs    02/23/21 0548 02/24/21 0524  NA 127* 133*  K 4.1 4.8  CL 95* 99  CO2 23 24  GLUCOSE 488* 360*  BUN 26* 20  CREATININE 1.37* 0.93  CALCIUM 9.2 9.0     Studies/Results: MR ABDOMEN W WO CONTRAST  Result Date: 02/23/2021 CLINICAL DATA:  Indeterminate renal mass EXAM: MRI ABDOMEN WITHOUT AND WITH CONTRAST TECHNIQUE: Multiplanar multisequence MR imaging of the abdomen was performed both before and after the administration of intravenous contrast. CONTRAST:  67mL GADAVIST GADOBUTROL 1 MMOL/ML IV SOLN COMPARISON:  CT 02/23/2021, CT 06/16/2016 FINDINGS: Lower chest: Mild cardiomegaly.  Bibasilar atelectasis. Hepatobiliary: No mass or other parenchymal abnormality identified. No intra or extrahepatic biliary ductal dilation. Gallbladder unremarkable. Pancreas: No mass, inflammatory changes, or other parenchymal abnormality identified. Spleen:  Within normal limits in size and appearance. Adrenals/Urinary Tract: The adrenal glands are unremarkable. The kidneys are normal in size and position. No hydronephrosis. The lesion in  question within the medial, lower pole of the left kidney represents a 3.2 x 2.9 cm mass which demonstrates heterogeneous areas of low signal intensity on T1 and T2 weighted imaging suggesting internal hemorrhagic components, heterogeneous areas of internal enhancement, best appreciated on delayed phase imaging, and subtle areas of restricted diffusion internally. Altogether, this is most in keeping with a solid renal mass and a primary renal neoplasm is suspected. This demonstrates interval increase in size since remote priorl examination of 06/16/2016 where this measured 2.1 cm in greatest dimension. No involvement of the adjacent left renal pelvicalyceal system. No involvement of the left renal vein. No adjacent pathologic adenopathy. 16 mm intracortical lesion demonstrates high T2 and low T1 intrinsic signal intensity without restricted diffusion or significant enhancement is most compatible with a simple cortical cyst. Multiple additional tiny simple cortical cysts are identified bilaterally. No enhancing intrarenal masses are identified. Proximal portion of a double-J ureteral stent identified within the right renal pelvis which appears minimally distended. Stomach/Bowel: Visualized portions within the abdomen are unremarkable. Vascular/Lymphatic: No pathologically enlarged lymph nodes identified. No abdominal aortic aneurysm demonstrated. Other: Artifact related to spinal fusion hardware noted within the lower lumbar spine. Musculoskeletal: No suspicious bone lesions identified. 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 No involvement of the left renal collecting system or left renal vein. No adjacent pathologic adenopathy. Electronically Signed   By: Fidela Salisbury M.D.   On: 02/23/2021 19:40   DG C-Arm 1-60 Min-No Report  Result Date: 02/23/2021 Fluoroscopy was utilized by the requesting  physician.  No radiographic interpretation.   CT RENAL STONE STUDY  Result Date: 02/23/2021 CLINICAL DATA:  Right flank pain with kidney stone. Increasing pain. EXAM: CT ABDOMEN AND PELVIS WITHOUT CONTRAST TECHNIQUE: Multidetector CT imaging of the abdomen and pelvis was performed following the standard protocol without IV contrast. COMPARISON:  CT without contrast 02/21/2021. FINDINGS: Lower chest: No acute abnormality. Hepatobiliary: No focal liver abnormality is seen. No gallstones, gallbladder wall thickening, or biliary dilatation. Pancreas: Not well seen due to the patient having very low body fat. No obvious mass or ductal dilatation. Spleen: Unremarkable. Adrenals/Urinary Tract: 2.1 cm left adrenal stable nodule. Normal right adrenal gland. There is continued moderate right hydroureteronephrosis above 2 tandem right ureteral stones measuring 5 mm and 3 mm at the level of the L4 superior endplate, unchanged. No other ureteral stones are seen. Nonobstructive nephrolithiasis within both kidneys is unchanged. 2.4 cm indeterminate mass again noted low anterior left kidney. Mild bladder thickening versus underdistention. Stomach/Bowel: Also not well seen due to lack of contrast and body fat. No small bowel dilatation is evident. Moderate to severe retained stool is again shown, normal appendix and no appreciable wall thickening of the colon. Scattered sigmoid diverticula. Vascular/Lymphatic: Aortic atherosclerosis. No enlarged abdominal or pelvic lymph nodes. Reproductive: Slight prostatomegaly with central calcifications. Pelvic phleboliths. Other: Trace posterior pelvic ascites unchanged. Musculoskeletal: Degenerative and postsurgical change of the spine. IMPRESSION: 1. Unchanged positioning of tandem 5 mm and 3 mm proximal right ureteral stones at the superior L4 level, with continued moderate upstream hydroureteronephrosis. Correlate clinically for infectious complication. 2. Additional nonobstructive  nephrolithiasis and stable left adrenal nodule. 3. 2.3 cm indeterminate low-density left renal lesion. MRI or ultrasound follow-up recommended. 1. Constipation. 2. Persistent trace ascites. 3. Prostatomegaly with mild bladder thickening versus nondistention. Electronically Signed   By: Telford Nab M.D.   On: 02/23/2021 05:57    Assessment/Plan: Right renal colic: CT A/P 63/33/5456 with 5 mm and 3 mm proximal right ureteral stones with proximal right hydroureteronephrosis.  No sign of infection.  He has had persistent pain refractory to medication requiring 3 ED visits within the past week.  S/p cystoscopy, right retrograde pyelogram, right ureteral stent placement 02/23/2021 AKI: Resolved Left renal mass: MRI 02/24/2019 with 3.2 cm heterogeneously enhancing mass within the medial lower pole of the left kidney.  No involvement of the left renal vein.  1 artery, 1 vein.  Mass is abutting the left renal vein. Left adrenal nodule measuring 2.1 cm  -I reviewed operative case and recent MRI. -Appropriate for discharge home with pain control. -He understands he needs to follow-up at [facility] urology to schedule definitive treatment of his right ureteral stones as well as his left renal mass.  I discussed plan of treating with cystoscopy, right retrograde pyelogram, right ureteroscopy with laser lithotripsy. -I did discuss treatment options including left robotic assisted laparoscopic partial nephrectomy versus radical nephrectomy. -He understands risks and benefits. -I messaged schedulers to arrange follow-up.   LOS: 0 days   Matt R. Ajanay Farve MD 02/24/2021, 9:27 AM [facility]  Pager: [phone]

## 2021-02-24 NOTE — TOC Transition Note (Signed)
Transition of Care [facility]) - CM/SW Discharge Note   Patient Details  Name: David Wyatt MRN: [id number] Date of Birth: 1961-02-17  Transition of Care [facility]) CM/SW Contact:  Ross Ludwig, LCSW Phone Number: 02/24/2021, 4:04 PM   Clinical Narrative:     Patient is discharging today, CSW was informed that patient is homeless.  Per chart review, patient has been staying in a hotel.  Patient has a follow up appointment with [facility] for March 22, 2021.  Per Urology message was sent to their schedulers to arrange a follow up appointment.  CSW provided substance abuse resources and list of shelters on his AVS.  CSW signing off, please reconsult for other social work needs.   Final next level of care: Home/Self Care Barriers to Discharge: Barriers Resolved   Patient Goals and CMS Choice Patient states their goals for this hospitalization and ongoing recovery are:: To return back home.      Discharge Placement                       Discharge Plan and Services                                     Social Determinants of Health (SDOH) Interventions     Readmission Risk Interventions No flowsheet data found.

## 2021-02-25 DIAGNOSIS — N1330 Unspecified hydronephrosis: Secondary | ICD-10-CM | POA: Diagnosis not present

## 2021-02-25 DIAGNOSIS — I10 Essential (primary) hypertension: Secondary | ICD-10-CM | POA: Diagnosis not present

## 2021-02-25 LAB — COMPREHENSIVE METABOLIC PANEL
ALT: 10 U/L (ref 0–44)
AST: 10 U/L — ABNORMAL LOW (ref 15–41)
Albumin: 2.7 g/dL — ABNORMAL LOW (ref 3.5–5.0)
Alkaline Phosphatase: 65 U/L (ref 38–126)
Anion gap: 6 (ref 5–15)
BUN: 18 mg/dL (ref 8–23)
CO2: 25 mmol/L (ref 22–32)
Calcium: 8.4 mg/dL — ABNORMAL LOW (ref 8.9–10.3)
Chloride: 103 mmol/L (ref 98–111)
Creatinine, Ser: 0.84 mg/dL (ref 0.61–1.24)
GFR, Estimated: >60 mL/min (ref >60)
Glucose, Bld: 246 mg/dL — ABNORMAL HIGH (ref 70–99)
Potassium: 3.8 mmol/L (ref 3.5–5.1)
Sodium: 134 mmol/L — ABNORMAL LOW (ref 135–145)
Total Bilirubin: 0.6 mg/dL (ref 0.3–1.2)
Total Protein: 6.1 g/dL — ABNORMAL LOW (ref 6.5–8.1)

## 2021-02-25 LAB — CBC
HCT: 36.7 % — ABNORMAL LOW (ref 39.0–52.0)
Hemoglobin: 12.4 g/dL — ABNORMAL LOW (ref 13.0–17.0)
MCH: 31.3 pg (ref 26.0–34.0)
MCHC: 33.8 g/dL (ref 30.0–36.0)
MCV: 92.7 fL (ref 80.0–100.0)
Platelets: 418 10*3/uL — ABNORMAL HIGH (ref 150–400)
RBC: 3.96 MIL/uL — ABNORMAL LOW (ref 4.22–5.81)
RDW: 12.3 % (ref 11.5–15.5)
WBC: 7.6 10*3/uL (ref 4.0–10.5)
nRBC: 0 % (ref 0.0–0.2)

## 2021-02-25 LAB — GLUCOSE, CAPILLARY
Glucose-Capillary: 350 mg/dL — ABNORMAL HIGH (ref 70–99)
Glucose-Capillary: 138 mg/dL — ABNORMAL HIGH (ref 70–99)
Glucose-Capillary: 210 mg/dL — ABNORMAL HIGH (ref 70–99)

## 2021-02-25 MED ORDER — KETOROLAC TROMETHAMINE 10 MG PO TABS: 10.0000 mg | ORAL_TABLET | Freq: Four times a day (QID) | ORAL | 0 refills | Status: DC | PRN

## 2021-02-25 NOTE — TOC Transition Note (Signed)
Transition of Care [facility]) - CM/SW Discharge Note   Patient Details  Name: David Wyatt MRN: [id number] Date of Birth: April 18, 1960  Transition [facility] Care [facility]) CM/SW Contact:  Kaden Dunkel, Marta Lamas, LCSW Phone Number: 02/25/2021, 1:39 PM   Clinical Narrative:     Acknowledged request to speak directly with patient regarding homeless issues.  Noted, TOC CM/SW provided list of shelters on 02/24/2021.  In conversing with patient, he reported no needs, other than to know of affirmative discharge date.  CSW explained plan for discharge today.  Patient then stated, "You need to get your shit together", then terminated the call.  Final next level of care: Home/Self Care Barriers to Discharge: Barriers Resolved   Patient Goals and CMS Choice Patient states their goals for this hospitalization and ongoing recovery are:: Return home.      Discharge Placement               N/A        Discharge Plan and Services                  Discharge Home w/ Self Care.                   Social Determinants of Health (SDOH) Interventions     Readmission Risk Interventions No flowsheet data found.

## 2021-02-25 NOTE — Evaluation (Signed)
Physical Therapy Evaluation Patient Details Name: David Wyatt MRN: [id number] DOB: [birth year] Today's Date: 02/25/2021  History of Present Illness  60 yo male admitted with hydronephrosis of kidney. S/P R ureteral stent placement 12/16. Imaging (+) renal mass. Hx of angioedema, OA, chronic pain, neuropathy, DM, inguinal hernia repair 2018, drug use.  Clinical Impression  On eval, pt was Min guard assist for mobility. He walked ~150 feet with a RW. Pt c/o general weakness and R shoulder pain during session. He reports hx of R rotator cuff injury. He stated he would like to have a walker. He expressed some frustrations with his stay. He does not seem to have a d/c plan in place-would recommend a TOC consult. Recommend daily ambulation in hallway with nursing supervision. He requested not to be told at last minute if he will be discharged. Encouraged him to speak with his RN/MD concerning discharge. Will plan to follow during hospital stay. Recommend f/u with OP PT for R shoulder if symptoms persist.      Recommendations for follow up therapy are one component of a multi-disciplinary discharge planning process, led by the attending physician.  Recommendations may be updated based on patient status, additional functional criteria and insurance authorization.  Follow Up Recommendations No PT follow up    Assistance Recommended at Discharge Intermittent Supervision/Assistance  Functional Status Assessment Patient has had a recent decline in their functional status and demonstrates the ability to make significant improvements in function in a reasonable and predictable amount of time.   Equipment Recommendations  Rolling walker (2 wheels)    Recommendations for Other Services       Precautions / Restrictions Precautions Precautions: Fall Restrictions Weight Bearing Restrictions: No      Mobility  Bed Mobility Overal bed mobility: Modified Independent                   Transfers Overall transfer level: Needs assistance Equipment used: Rolling walker (2 wheels) Transfers: Sit to/from Stand Sit to Stand: Supervision           General transfer comment: Supv for cues-hand placement    Ambulation/Gait Ambulation/Gait assistance: Min guard Gait Distance (Feet): 150 Feet Assistive device: Rolling walker (2 wheels) Gait Pattern/deviations: Step-through pattern;Decreased stride length       General Gait Details: Min guard for safety. Cues for posture, RW proximity. Pt c/o R shoulder pain with RW use-encouraged him to weightbear more through legs instead of UEs.  Stairs            Wheelchair Mobility    Modified Rankin (Stroke Patients Only)       Balance Overall balance assessment: Needs assistance         Standing balance support: Bilateral upper extremity supported;Reliant on assistive device for balance Standing balance-Leahy Scale: Fair                               Pertinent Vitals/Pain Pain Assessment: Faces Faces Pain Scale: Hurts even more Pain Location: R shoulder, low back Pain Descriptors / Indicators: Grimacing;Discomfort Pain Intervention(s): Monitored during session    Home Living Family/patient expects to be discharged to:: Shelter/Homeless Living Arrangements: Alone               Home Equipment: None      Prior Function Prior Level of Function : Independent/Modified Independent  Hand Dominance        Extremity/Trunk Assessment   Upper Extremity Assessment Upper Extremity Assessment:  (pt reports hx of R rotator cuff injury)    Lower Extremity Assessment Lower Extremity Assessment: Generalized weakness    Cervical / Trunk Assessment Cervical / Trunk Assessment: Normal  Communication   Communication: No difficulties  Cognition Arousal/Alertness: Awake/alert Behavior During Therapy: WFL for tasks assessed/performed Overall Cognitive Status:  Within Functional Limits for tasks assessed                                          General Comments      Exercises     Assessment/Plan    PT Assessment Patient needs continued PT services  PT Problem List Decreased mobility;Decreased activity tolerance;Decreased balance;Decreased knowledge of use of DME;Pain       PT Treatment Interventions DME instruction;Gait training;Therapeutic activities;Therapeutic exercise;Patient/family education;Balance training;Functional mobility training    PT Goals (Current goals can be found in the Care Plan section)  Acute Rehab PT Goals Patient Stated Goal: "if they are discharging me, don't tell me at the last minute so I can get a ride" PT Goal Formulation: With patient Time For Goal Achievement: 03/11/21 Potential to Achieve Goals: Good    Frequency Min 3X/week   Barriers to discharge Decreased caregiver support      Co-evaluation               AM-PAC PT "6 Clicks" Mobility  Outcome Measure Help needed turning from your back to your side while in a flat bed without using bedrails?: None Help needed moving from lying on your back to sitting on the side of a flat bed without using bedrails?: None Help needed moving to and from a bed to a chair (including a wheelchair)?: A Little Help needed standing up from a chair using your arms (e.g., wheelchair or bedside chair)?: A Little Help needed to walk in hospital room?: A Little Help needed climbing 3-5 steps with a railing? : A Little 6 Click Score: 20    End of Session Equipment Utilized During Treatment: Gait belt Activity Tolerance: Patient tolerated treatment well Patient left: in bed;with call bell/phone within reach   PT Visit Diagnosis: Unsteadiness on feet (R26.81);Difficulty in walking, not elsewhere classified (R26.2)    Time: 5465-0354 PT Time Calculation (min) (ACUTE ONLY): 24 min   Charges:   PT Evaluation $PT Eval Moderate Complexity: 1  Mod PT Treatments $Gait Training: 8-22 mins           Doreatha Massed, PT Acute Rehabilitation  Office: [phone] Pager: [phone]

## 2021-02-25 NOTE — Progress Notes (Signed)
2 Days Post-Op Subjective: Denies pain.  No nausea or emesis.  Tolerating diet.  Voiding without difficulty.  Afebrile  Objective: Vital signs in last 24 hours: Temp:  [97.6 F (36.4 C)-98.5 F (36.9 C)] 98.5 F (36.9 C) (12/18 0551) Pulse Rate:  [54-81] 54 (12/18 0551) Resp:  [18] 18 (12/18 0551) BP: (134-154)/(79-94) 154/94 (12/18 0551) SpO2:  [99 %-100 %] 100 % (12/18 0551)  Intake/Output from previous day: 12/17 0701 - 12/18 0700 In: 1680 [P.O.:1680] Out: 3225 [Urine:3225] Intake/Output this shift: No intake/output data recorded. UOP: 3.2L  Physical Exam:  General: Alert and oriented CV: RRR Lungs: Clear Abdomen: Soft, ND, NT Ext: NT, No erythema  Lab Results: Recent Labs    02/23/21 0548 02/24/21 0524 02/25/21 0439  HGB 14.6 13.6 12.4*  HCT 43.6 40.6 36.7*   BMET Recent Labs    02/24/21 0524 02/25/21 0439  NA 133* 134*  K 4.8 3.8  CL 99 103  CO2 24 25  GLUCOSE 360* 246*  BUN 20 18  CREATININE 0.93 0.84  CALCIUM 9.0 8.4*     Studies/Results: MR ABDOMEN W WO CONTRAST  Result Date: 02/23/2021 CLINICAL DATA:  Indeterminate renal mass EXAM: MRI ABDOMEN WITHOUT AND WITH CONTRAST TECHNIQUE: Multiplanar multisequence MR imaging of the abdomen was performed both before and after the administration of intravenous contrast. CONTRAST:  49mL GADAVIST GADOBUTROL 1 MMOL/ML IV SOLN COMPARISON:  CT 02/23/2021, CT 06/16/2016 FINDINGS: Lower chest: Mild cardiomegaly.  Bibasilar atelectasis. Hepatobiliary: No mass or other parenchymal abnormality identified. No intra or extrahepatic biliary ductal dilation. Gallbladder unremarkable. Pancreas: No mass, inflammatory changes, or other parenchymal abnormality identified. Spleen:  Within normal limits in size and appearance. Adrenals/Urinary Tract: The adrenal glands are unremarkable. The kidneys are normal in size and position. No hydronephrosis. The lesion in question within the medial, lower pole of the left kidney represents  a 3.2 x 2.9 cm mass which demonstrates heterogeneous areas of low signal intensity on T1 and T2 weighted imaging suggesting internal hemorrhagic components, heterogeneous areas of internal enhancement, best appreciated on delayed phase imaging, and subtle areas of restricted diffusion internally. Altogether, this is most in keeping with a solid renal mass and a primary renal neoplasm is suspected. This demonstrates interval increase in size since remote priorl examination of 06/16/2016 where this measured 2.1 cm in greatest dimension. No involvement of the adjacent left renal pelvicalyceal system. No involvement of the left renal vein. No adjacent pathologic adenopathy. 16 mm intracortical lesion demonstrates high T2 and low T1 intrinsic signal intensity without restricted diffusion or significant enhancement is most compatible with a simple cortical cyst. Multiple additional tiny simple cortical cysts are identified bilaterally. No enhancing intrarenal masses are identified. Proximal portion of a double-J ureteral stent identified within the right renal pelvis which appears minimally distended. Stomach/Bowel: Visualized portions within the abdomen are unremarkable. Vascular/Lymphatic: No pathologically enlarged lymph nodes identified. No abdominal aortic aneurysm demonstrated. Other: Artifact related to spinal fusion hardware noted within the lower lumbar spine. Musculoskeletal: No suspicious bone lesions identified. 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 No involvement of the left renal collecting system or left renal vein. No adjacent pathologic adenopathy. Electronically Signed   By: Fidela Salisbury M.D.   On: 02/23/2021 19:40   DG C-Arm 1-60 Min-No Report  Result Date: 02/23/2021 Fluoroscopy was utilized by the requesting physician.  No radiographic interpretation.    Assessment/Plan: Right  renal  colic: CT A/P 02/23/2021 with 5 mm and 3 mm proximal right ureteral stones with proximal right hydroureteronephrosis.  No sign of infection.  He has had persistent pain refractory to medication requiring 3 ED visits within the past week.  S/p cystoscopy, right retrograde pyelogram, right ureteral stent placement 02/23/2021 AKI: Resolved Left renal mass: MRI 02/24/2019 with 3.2 cm heterogeneously enhancing mass within the medial lower pole of the left kidney.  No involvement of the left renal vein.  1 artery, 1 vein.  Mass is abutting the left renal vein. Left adrenal nodule measuring 2.1 cm   -Appropriate for discharge home with pain control. -He understands he needs to follow-up at [facility] urology to schedule definitive treatment of his right ureteral stones as well as his left renal mass.  I discussed plan of treating with cystoscopy, right retrograde pyelogram, right ureteroscopy with laser lithotripsy. -I did discuss treatment options including left robotic assisted laparoscopic partial nephrectomy versus radical nephrectomy. -He understands risks and benefits. -Adrenal labs still pending. AM cortisol appropriately low with dexamethasone suppression -I messaged schedulers to arrange follow-up.   LOS: 0 days   Matt R. Marvion Bastidas MD 02/25/2021, 9:14 AM [facility]  Pager: [phone]

## 2021-02-25 NOTE — TOC Progression Note (Signed)
Transition of Care [facility]) - Progression Note    Patient Details  Name: David Wyatt MRN: [id number] Date of Birth: 1960-09-26  Transition of Care [facility]) CM/SW Contact  Ross Ludwig, [place] Phone Number: 02/25/2021, 4:55 PM  Clinical Narrative:     Patient informed CSW that he needed a ride back to his hotel at [facility] and [place] at [place], [place], [place] [address].  CSW arranged ride for patient through [place] ride share program.  Patient's ride should be here around 5pm.    Barriers to Discharge: Barriers Resolved  Expected Discharge Plan and Services           Expected Discharge Date: 02/25/21                                     Social Determinants of Health (SDOH) Interventions    Readmission Risk Interventions No flowsheet data found.

## 2021-02-25 NOTE — Discharge Summary (Signed)
Physician Discharge Summary  David Wyatt CBS:[id number] DOB: 1961-02-24 DOA: 02/23/2021  PCP: David Rakes, MD  Admit date: 02/23/2021 Discharge date: 02/25/2021  Admitted From: Home Disposition:  Home  Recommendations for Outpatient Follow-up:  Follow up with PCP in 1-2 weeks Follow up with Urology as scheduled  Discharge Condition:Stable CODE STATUS:Full Diet recommendation: Diabetic   Brief/Interim Summary:  60 y.o. male with medical history significant of ACE inhibitor induced angioedema, osteoarthritis, chronic back pain, history of cocaine abuse, tobacco use, diabetic neuropathy, type II DM, hyperlipidemia, hypertension who is coming to the emergency department due to right flank pain for the fourth time in the past 7 days.Pt was found to have ureteral stones on imaging.  Urology was consulted.  Discharge Diagnoses:  Principal Problem:   Hydronephrosis of right kidney Active Problems:   Chronic back pain   Essential hypertension, benign   Diabetic neuropathy ([place])   Hyperlipidemia   Type 2 diabetes mellitus with hyperglycemia (HCC)  Principal Problem:   Hydronephrosis of right kidney Pt was continued with IVF Continued with analgesics as needed. Nursing documentation reviewed. Pt did not require much narcotic post-procedure No signs of infection/antibiotics deferred at this time. MRI to kidney reviewed, findings concerning for renal mass, see below   Active Problems:   Type 2 diabetes mellitus with hyperglycemia (HCC) Continue IV fluids. Carbohydrate modified diet postop.     Chronic back pain Analgesics as needed. Muscle relaxants as needed. Discussed with nursing, concerns for unsteadiness on feet.  Staff concerned that patient will be considered possible fall risk Cleared by PT     Essential hypertension, benign Continued amlodipine 10 mg p.o. daily.     Diabetic neuropathy ([place]) Has not been taking gabapentin.     Hyperlipidemia Currently not on  any medication.   Renal mass -MRI reviewed.  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 -Would have patient follow-up closely with urology    Discharge Instructions   Allergies as of 02/25/2021       Reactions   Lisinopril Other (See Comments)   Lip Swelling, angioedema        Medication List     STOP taking these medications    fluticasone 50 MCG/ACT nasal spray Commonly known as: FLONASE   HYDROcodone-acetaminophen 5-325 MG tablet Commonly known as: NORCO/VICODIN   naproxen 375 MG tablet Commonly known as: NAPROSYN       TAKE these medications    Accu-Chek Guide test strip Generic drug: glucose blood Check blood sugar 2 times daily What changed:  how much to take how to take this when to take this additional instructions   Accu-Chek Guide w/Device Kit USE AS DIRECTED TO CHECK BLOOD SUGARS UP TO TWICE PER DAY What changed: See the new instructions.   Accu-Chek Guide w/Device Kit Use as directed What changed:  how much to take how to take this when to take this   Accu-Chek Softclix Lancets lancets Test blood sugar 2 times daily What changed:  how much to take how to take this when to take this   amLODipine 10 MG tablet Commonly known as: NORVASC Take 1 tablet (10 mg total) by mouth daily.   atorvastatin 40 MG tablet Commonly known as: LIPITOR Take 1 tablet (40 mg total) by mouth daily.   BD Pen Needle Nano U/F 32G X 4 MM Misc Generic drug: Insulin Pen Needle Use with Victoza  What changed:  how much to take how to take this when to take this additional instructions   cyclobenzaprine 10 MG tablet Commonly known as: FLEXERIL Take 1 tablet (10 mg total) by mouth 2 (two) times daily as needed for muscle spasms.   DULoxetine 60 MG capsule Commonly known as: Cymbalta Take 1 capsule (60 mg total) by mouth daily.    empagliflozin 10 MG Tabs tablet Commonly known as: Jardiance Take 1 tablet (10 mg total) by mouth daily before breakfast.   gabapentin 300 MG capsule Commonly known as: NEURONTIN Take 2 capsules (600 mg total) by mouth 3 (three) times daily.   glipiZIDE 10 MG 24 hr tablet Commonly known as: GLUCOTROL XL Take 1 tablet (10 mg total) by mouth daily with breakfast.   ketorolac 10 MG tablet Commonly known as: TORADOL Take 1 tablet (10 mg total) by mouth every 6 (six) hours as needed.   metFORMIN 1000 MG tablet Commonly known as: GLUCOPHAGE Take 1 tablet (1,000 mg total) by mouth 2 (two) times daily.   ondansetron 4 MG tablet Commonly known as: ZOFRAN Take 1 tablet (4 mg total) by mouth every 6 (six) hours. What changed:  when to take this reasons to take this   Victoza 18 MG/3ML Sopn Generic drug: liraglutide Inject 0.6 mg once daily        Follow-up Information     David Rakes, MD Follow up in 2 week(s).   Specialty: Family Medicine Why: Hospital follow up Contact information: [place] [address] [phone]         David Lima, MD Follow up.   Specialty: Urology Why: as scheduled, Hospital follow up Contact information: [place] [address] [phone]                Allergies  Allergen Reactions   Lisinopril Other (See Comments)    Lip Swelling, angioedema    Consultations: Urology  Procedures/Studies: DG Lumbar Spine Complete  Result Date: 02/17/2021 CLINICAL DATA:  60 year old male with acute on chronic low back pain radiating down the right lower extremity. EXAM: LUMBAR SPINE - COMPLETE 4+ VIEW COMPARISON:  Lumbar radiographs 09/22/2013 and earlier. Lumbar MRI 12/21/2017. FINDINGS: Normal lumbar segmentation. Since 2015 posterior and interbody fusion at L4-L5, present on the 2019 MRI. Hardware appears intact. Posterior decompression at that level. Mild straightening of lordosis since 2015.  Moderate lumbar disc space loss at L3-L4 and mild to moderate facet hypertrophy at that level since 2015. No acute osseous abnormality identified. Osteopenia. Negative visible chest and abdominal visceral contours. IMPRESSION: 1. 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 2. No acute osseous abnormality identified. Electronically Signed   By: Genevie Ann M.D.   On: 02/17/2021 07:22   DG Abdomen 1 View  Result Date: 02/21/2021 CLINICAL DATA:  Right-sided flank pain and constipation EXAM: ABDOMEN - 1 VIEW COMPARISON:  02/17/2021 FINDINGS: The amount of gas and stool within the colon is within the range of normal. Small bowel pattern is normal. Small bilateral renal calculi remain visible. Previous lumbosacral discectomy and fusion. IMPRESSION: Gas pattern within normal limits presently. Small renal calculi. Electronically Signed   By: Nelson Chimes M.D.   On: 02/21/2021 13:13   MR ABDOMEN W WO CONTRAST  Result Date: 02/23/2021 CLINICAL DATA:  Indeterminate renal mass EXAM: MRI ABDOMEN WITHOUT AND WITH CONTRAST TECHNIQUE: Multiplanar multisequence MR imaging of the abdomen was performed both before and after the  administration of intravenous contrast. CONTRAST:  40m GADAVIST GADOBUTROL 1 MMOL/ML IV SOLN COMPARISON:  CT 02/23/2021, CT 06/16/2016 FINDINGS: Lower chest: Mild cardiomegaly.  Bibasilar atelectasis. Hepatobiliary: No mass or other parenchymal abnormality identified. No intra or extrahepatic biliary ductal dilation. Gallbladder unremarkable. Pancreas: No mass, inflammatory changes, or other parenchymal abnormality identified. Spleen:  Within normal limits in size and appearance. Adrenals/Urinary Tract: The adrenal glands are unremarkable. The kidneys are normal in size and position. No hydronephrosis. The lesion in question within the medial, lower pole of the left kidney represents a 3.2 x 2.9 cm mass which demonstrates heterogeneous areas of low  signal intensity on T1 and T2 weighted imaging suggesting internal hemorrhagic components, heterogeneous areas of internal enhancement, best appreciated on delayed phase imaging, and subtle areas of restricted diffusion internally. Altogether, this is most in keeping with a solid renal mass and a primary renal neoplasm is suspected. This demonstrates interval increase in size since remote priorl examination of 06/16/2016 where this measured 2.1 cm in greatest dimension. No involvement of the adjacent left renal pelvicalyceal system. No involvement of the left renal vein. No adjacent pathologic adenopathy. 16 mm intracortical lesion demonstrates high T2 and low T1 intrinsic signal intensity without restricted diffusion or significant enhancement is most compatible with a simple cortical cyst. Multiple additional tiny simple cortical cysts are identified bilaterally. No enhancing intrarenal masses are identified. Proximal portion of a double-J ureteral stent identified within the right renal pelvis which appears minimally distended. Stomach/Bowel: Visualized portions within the abdomen are unremarkable. Vascular/Lymphatic: No pathologically enlarged lymph nodes identified. No abdominal aortic aneurysm demonstrated. Other: Artifact related to spinal fusion hardware noted within the lower lumbar spine. Musculoskeletal: No suspicious bone lesions identified. 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 No involvement of the left renal collecting system or left renal vein. No adjacent pathologic adenopathy. Electronically Signed   By: AFidela SalisburyM.D.   On: 02/23/2021 19:40   DG C-Arm 1-60 Min-No Report  Result Date: 02/23/2021 Fluoroscopy was utilized by the requesting physician.  No radiographic interpretation.   CT RENAL STONE STUDY  Result Date: 02/23/2021 CLINICAL DATA:  Right flank pain with  kidney stone. Increasing pain. EXAM: CT ABDOMEN AND PELVIS WITHOUT CONTRAST TECHNIQUE: Multidetector CT imaging of the abdomen and pelvis was performed following the standard protocol without IV contrast. COMPARISON:  CT without contrast 02/21/2021. FINDINGS: Lower chest: No acute abnormality. Hepatobiliary: No focal liver abnormality is seen. No gallstones, gallbladder wall thickening, or biliary dilatation. Pancreas: Not well seen due to the patient having very low body fat. No obvious mass or ductal dilatation. Spleen: Unremarkable. Adrenals/Urinary Tract: 2.1 cm left adrenal stable nodule. Normal right adrenal gland. There is continued moderate right hydroureteronephrosis above 2 tandem right ureteral stones measuring 5 mm and 3 mm at the level of the L4 superior endplate, unchanged. No other ureteral stones are seen. Nonobstructive nephrolithiasis within both kidneys is unchanged. 2.4 cm indeterminate mass again noted low anterior left kidney. Mild bladder thickening versus underdistention. Stomach/Bowel: Also not well seen due to lack of contrast and body fat. No small bowel dilatation is evident. Moderate to severe retained stool is again shown, normal appendix and no appreciable wall thickening of the colon. Scattered sigmoid diverticula. Vascular/Lymphatic: Aortic atherosclerosis. No enlarged abdominal or pelvic lymph nodes. Reproductive: Slight prostatomegaly with central calcifications. Pelvic phleboliths. Other: Trace posterior pelvic ascites unchanged. Musculoskeletal: Degenerative and postsurgical change of  the spine. IMPRESSION: 1. Unchanged positioning of tandem 5 mm and 3 mm proximal right ureteral stones at the superior L4 level, with continued moderate upstream hydroureteronephrosis. Correlate clinically for infectious complication. 2. Additional nonobstructive nephrolithiasis and stable left adrenal nodule. 3. 2.3 cm indeterminate low-density left renal lesion. MRI or ultrasound follow-up  recommended. 1. Constipation. 2. Persistent trace ascites. 3. Prostatomegaly with mild bladder thickening versus nondistention. Electronically Signed   By: Telford Nab M.D.   On: 02/23/2021 05:57   CT Renal Stone Study  Result Date: 02/21/2021 CLINICAL DATA:  Right flank pain with recent CT showing a right ureteral stone. EXAM: CT ABDOMEN AND PELVIS WITHOUT CONTRAST TECHNIQUE: Multidetector CT imaging of the abdomen and pelvis was performed following the standard protocol without IV contrast. COMPARISON:  Recent CT no contrast 02/17/2021, older CT with contrast 06/16/2016 FINDINGS: Lower chest: There are few linear scar-like opacities in the lung bases without infiltrates. The cardiac size is normal. Hepatobiliary: Unremarkable noncontrast appearance of the liver, gallbladder bile ducts. Pancreas: Not adequately visualized due to a general paucity of body fat. No obvious mass or ductal dilatation. Spleen: Unremarkable without contrast.  No splenomegaly. Adrenals/Urinary Tract: Stable 2.1 cm left adrenal nodule. No right adrenal lesions. Tandem proximal right ureteral stones are again noted measuring 5 mm and 3 mm, largest one on top of the smaller stone, both now at the level of the L4 superior endplate previously at the level the L3 lower plate having moved inferiorly no more than 1 cm. There is a 4 mm nonobstructive caliceal stone in the right lower pole, 3 mm nonobstructing stone in the left lower pole collecting system. A small cortical calcification in the outer left kidney is again shown. There is a 2.4 cm mass of the lower anterior left kidney of 33 Hounsfield units above the usual density of simple fluid, in 2018 was 1.9 cm. Ultrasound or MRI recommended to assess for internal complexity. The unenhanced renal cortex is otherwise unremarkable. No significant bladder thickening is seen. Stomach/Bowel: Not well evaluated due to lack of contrast and minimal body fat. The appendix is visible and  unremarkable, no dilated or frankly thickened small bowel or stomach lining is seen. There is moderate to severe fecal stasis. There are colonic diverticula without wall thickening or adjacent inflammation. Vascular/Lymphatic: Aortic atherosclerosis. No enlarged abdominal or pelvic lymph nodes. Please note, assessment for adenopathy limited due to unopacified bowel and minimal body fat. Reproductive: The prostate is slightly enlarged. There are calcifications in the central prostate. Mild impression on the posterior bladder. Pelvic phleboliths. Other: Trace ascites in the posterior deep pelvis. No free air, hemorrhage or abscess , no ventral hernia. Musculoskeletal: Old fusion hardware, laminectomy and interbody disc space apparatus L4-5. Degenerative changes lumbar spine mild osteopenia. Bone graft harvest site chronically noted posterior left ilium. IMPRESSION: 1. Tandem 5 mm and 3 mm proximal right ureteral stones at the level of the superior endplate of L4, only about 1 cm more inferior to the position of the stones 4 days ago. 2. Nonobstructive nephrolithiasis. 3. 2.3 cm indeterminate low-density lesion, slightly larger than in 2018, low anterior left kidney. Most likely this is a hyperdense cyst but ultrasound or MRI follow-up recommended to assess for internal complexity. 4. Moderate to severe fecal stasis, with uncomplicated colonic diverticula. 5. Trace ascites. 6. Slightly prominent prostate. Electronically Signed   By: Telford Nab M.D.   On: 02/21/2021 20:12   CT Renal Stone Study  Result Date: 02/17/2021 CLINICAL DATA:  Flank  pain, kidney stone suspected; right flank pain EXAM: CT ABDOMEN AND PELVIS WITHOUT CONTRAST TECHNIQUE: Multidetector CT imaging of the abdomen and pelvis was performed following the standard protocol without IV contrast. COMPARISON:  April 2018 FINDINGS: Lower chest: No acute abnormality. Hepatobiliary: No focal liver abnormality is seen. No gallstones, gallbladder wall  thickening, or biliary dilatation. Pancreas: Unremarkable. Spleen: Unremarkable. Adrenals/Urinary Tract: Adrenals are unremarkable. 4 mm periphery calcified lesion of the interpolar left kidney may reflect a stone or calcification associated with a cyst. Bilateral 2 mm nonobstructing renal calculi. There is right hydronephrosis. An obstructing 6 mm calculus is present within the proximal right ureter. Bladder is adequately distended for evaluation and is unremarkable. Stomach/Bowel: Stomach is within normal limits. Bowel is normal in caliber. Vascular/Lymphatic: Aortic atherosclerosis. No enlarged lymph nodes identified. Reproductive: Unremarkable. Other: No ascites.  Abdominal wall is unremarkable. Musculoskeletal: Postoperative changes of anterior and posterior fusion at L4-L5. No acute osseous abnormality. IMPRESSION: Obstructing 6 mm calculus of the proximal right ureter. Mild right hydronephrosis. Small bilateral nonobstructing renal calculi. Electronically Signed   By: Macy Mis M.D.   On: 02/17/2021 13:51    Subjective: Without complaints today  Discharge Exam: Vitals:   02/25/21 0551 02/25/21 1242  BP: (!) 154/94 127/75  Pulse: (!) 54 71  Resp: 18 18  Temp: 98.5 F (36.9 C) 98.6 F (37 C)  SpO2: 100% 99%   Vitals:   02/24/21 1233 02/24/21 2124 02/25/21 0551 02/25/21 1242  BP: (!) 152/92 (!) 154/85 (!) 154/94 127/75  Pulse: 81 69 (!) 54 71  Resp: 18 18 18 18   Temp:  97.6 F (36.4 C) 98.5 F (36.9 C) 98.6 F (37 C)  TempSrc:  Oral Oral Oral  SpO2: 100% 100% 100% 99%  Weight:      Height:        General: Pt is alert, awake, not in acute distress Cardiovascular: RRR, S1/S2 + Respiratory: CTA bilaterally, no wheezing, no rhonchi Abdominal: Soft, NT, ND, bowel sounds + Extremities: no edema, no cyanosis   The results of significant diagnostics from this hospitalization (including imaging, microbiology, ancillary and laboratory) are listed below for reference.      Microbiology: Recent Results (from the past 240 hour(s))  Resp Panel by RT-PCR (Flu A&B, Covid) Nasopharyngeal Swab     Status: None   Collection Time: 02/23/21  1:21 PM   Specimen: Nasopharyngeal Swab; Nasopharyngeal(NP) swabs in vial transport medium  Result Value Ref Range Status   SARS Coronavirus 2 by RT PCR NEGATIVE NEGATIVE Final    Comment: (NOTE) SARS-CoV-2 target nucleic acids are NOT DETECTED.  The SARS-CoV-2 RNA is generally detectable in upper respiratory specimens during the acute phase of infection. The lowest concentration of SARS-CoV-2 viral copies this assay can detect is 138 copies/mL. A negative result does not preclude SARS-Cov-2 infection and should not be used as the sole basis for treatment or other patient management decisions. A negative result may occur with  improper specimen collection/handling, submission of specimen other than nasopharyngeal swab, presence of viral mutation(s) within the areas targeted by this assay, and inadequate number of viral copies(<138 copies/mL). A negative result must be combined with clinical observations, patient history, and epidemiological information. The expected result is Negative.  Fact Sheet for Patients:  EntrepreneurPulse.com.au  Fact Sheet for Healthcare Providers:  IncredibleEmployment.be  This test is no t yet approved or cleared by the [place] FDA and  has been authorized for detection and/or diagnosis of SARS-CoV-2 by FDA under an Emergency  Use Authorization (EUA). This EUA will remain  in effect (meaning this test can be used) for the duration of the COVID-19 declaration under Section 564(b)(1) of the Act, 21 U.S.C.section 360bbb-3(b)(1), unless the authorization is terminated  or revoked sooner.       Influenza A by PCR NEGATIVE NEGATIVE Final   Influenza B by PCR NEGATIVE NEGATIVE Final    Comment: (NOTE) The Xpert Xpress SARS-CoV-2/FLU/RSV plus assay is  intended as an aid in the diagnosis of influenza from Nasopharyngeal swab specimens and should not be used as a sole basis for treatment. Nasal washings and aspirates are unacceptable for Xpert Xpress SARS-CoV-2/FLU/RSV testing.  Fact Sheet for Patients: EntrepreneurPulse.com.au  Fact Sheet for Healthcare Providers: IncredibleEmployment.be  This test is not yet approved or cleared by the [place] FDA and has been authorized for detection and/or diagnosis of SARS-CoV-2 by FDA under an Emergency Use Authorization (EUA). This EUA will remain in effect (meaning this test can be used) for the duration of the COVID-19 declaration under Section 564(b)(1) of the Act, 21 U.S.C. section 360bbb-3(b)(1), unless the authorization is terminated or revoked.  Performed at [facility], [place] [address], [place], [place] [address]      Labs: BNP (last 3 results) No results for input(s): BNP in the last 8760 hours. Basic Metabolic Panel: Recent Labs  Lab 02/19/21 1052 02/21/21 1258 02/23/21 0548 02/24/21 0524 02/25/21 0439  NA 130* 131* 127* 133* 134*  K 4.8 4.7 4.1 4.8 3.8  CL 96* 96* 95* 99 103  CO2 21* 23 23 24 25   GLUCOSE 332* 349* 488* 360* 246*  BUN 31* 27* 26* 20 18  CREATININE 1.66* 1.14 1.37* 0.93 0.84  CALCIUM 9.1 9.2 9.2 9.0 8.4*   Liver Function Tests: Recent Labs  Lab 02/24/21 0524 02/25/21 0439  AST 14* 10*  ALT 13 10  ALKPHOS 74 65  BILITOT 0.6 0.6  PROT 7.0 6.1*  ALBUMIN 3.1* 2.7*   No results for input(s): LIPASE, AMYLASE in the last 168 hours. No results for input(s): AMMONIA in the last 168 hours. CBC: Recent Labs  Lab 02/19/21 1052 02/21/21 1258 02/23/21 0548 02/24/21 0524 02/25/21 0439  WBC 13.1* 5.6 9.1 7.4 7.6  NEUTROABS 11.0* 3.8  --   --   --   HGB 15.7 15.8 14.6 13.6 12.4*  HCT 45.3 46.4 43.6 40.6 36.7*  MCV 90.8 92.2 92.4 91.2 92.7  PLT 358 404* 421* 435* 418*   Cardiac  Enzymes: No results for input(s): CKTOTAL, CKMB, CKMBINDEX, TROPONINI in the last 168 hours. BNP: Invalid input(s): POCBNP CBG: Recent Labs  Lab 02/23/21 1552 02/23/21 1728 02/24/21 2127 02/25/21 0816 02/25/21 1205  GLUCAP 132* 176* 306* 350* 138*   D-Dimer No results for input(s): DDIMER in the last 72 hours. Hgb A1c No results for input(s): HGBA1C in the last 72 hours. Lipid Profile No results for input(s): CHOL, HDL, LDLCALC, TRIG, CHOLHDL, LDLDIRECT in the last 72 hours. Thyroid function studies No results for input(s): TSH, T4TOTAL, T3FREE, THYROIDAB in the last 72 hours.  Invalid input(s): FREET3 Anemia work up No results for input(s): VITAMINB12, FOLATE, FERRITIN, TIBC, IRON, RETICCTPCT in the last 72 hours. Urinalysis    Component Value Date/Time   COLORURINE YELLOW 02/23/2021 0548   APPEARANCEUR CLEAR 02/23/2021 0548   LABSPEC 1.010 02/23/2021 0548   PHURINE 6.0 02/23/2021 0548   GLUCOSEU >=500 (A) 02/23/2021 0548   HGBUR SMALL (A) 02/23/2021 0548   BILIRUBINUR NEGATIVE 02/23/2021 0548   BILIRUBINUR  small 02/16/2019 1618   KETONESUR 5 (A) 02/23/2021 0548   PROTEINUR NEGATIVE 02/23/2021 0548   UROBILINOGEN 0.2 02/16/2019 1618   UROBILINOGEN 1.0 10/12/2012 0149   NITRITE NEGATIVE 02/23/2021 0548   LEUKOCYTESUR NEGATIVE 02/23/2021 0548   Sepsis Labs Invalid input(s): PROCALCITONIN,  WBC,  LACTICIDVEN Microbiology Recent Results (from the past 240 hour(s))  Resp Panel by RT-PCR (Flu A&B, Covid) Nasopharyngeal Swab     Status: None   Collection Time: 02/23/21  1:21 PM   Specimen: Nasopharyngeal Swab; Nasopharyngeal(NP) swabs in vial transport medium  Result Value Ref Range Status   SARS Coronavirus 2 by RT PCR NEGATIVE NEGATIVE Final    Comment: (NOTE) SARS-CoV-2 target nucleic acids are NOT DETECTED.  The SARS-CoV-2 RNA is generally detectable in upper respiratory specimens during the acute phase of infection. The lowest concentration of SARS-CoV-2 viral  copies this assay can detect is 138 copies/mL. A negative result does not preclude SARS-Cov-2 infection and should not be used as the sole basis for treatment or other patient management decisions. A negative result may occur with  improper specimen collection/handling, submission of specimen other than nasopharyngeal swab, presence of viral mutation(s) within the areas targeted by this assay, and inadequate number of viral copies(<138 copies/mL). A negative result must be combined with clinical observations, patient history, and epidemiological information. The expected result is Negative.  Fact Sheet for Patients:  EntrepreneurPulse.com.au  Fact Sheet for Healthcare Providers:  IncredibleEmployment.be  This test is no t yet approved or cleared by the [place] FDA and  has been authorized for detection and/or diagnosis of SARS-CoV-2 by FDA under an Emergency Use Authorization (EUA). This EUA will remain  in effect (meaning this test can be used) for the duration of the COVID-19 declaration under Section 564(b)(1) of the Act, 21 U.S.C.section 360bbb-3(b)(1), unless the authorization is terminated  or revoked sooner.       Influenza A by PCR NEGATIVE NEGATIVE Final   Influenza B by PCR NEGATIVE NEGATIVE Final    Comment: (NOTE) The Xpert Xpress SARS-CoV-2/FLU/RSV plus assay is intended as an aid in the diagnosis of influenza from Nasopharyngeal swab specimens and should not be used as a sole basis for treatment. Nasal washings and aspirates are unacceptable for Xpert Xpress SARS-CoV-2/FLU/RSV testing.  Fact Sheet for Patients: EntrepreneurPulse.com.au  Fact Sheet for Healthcare Providers: IncredibleEmployment.be  This test is not yet approved or cleared by the [place] FDA and has been authorized for detection and/or diagnosis of SARS-CoV-2 by FDA under an Emergency Use Authorization (EUA). This  EUA will remain in effect (meaning this test can be used) for the duration of the COVID-19 declaration under Section 564(b)(1) of the Act, 21 U.S.C. section 360bbb-3(b)(1), unless the authorization is terminated or revoked.  Performed at [facility], [place] [address], [place], [place] [address]    Time spent: 33mn  SIGNED:   SMarylu Lund MD  [facility] 02/25/2021, 1:00 PM  If 7PM-7AM, please contact night-coverage

## 2021-02-25 NOTE — Progress Notes (Signed)
Assessment unchanged. Pt verbalized understanding of dc instructions. Discharged via wc to font entrance to meet [facility].

## 2021-02-26 ENCOUNTER — Telehealth: Payer: Self-pay

## 2021-02-26 LAB — HEMOGLOBIN A1C
Hgb A1c MFr Bld: >15.5 % — ABNORMAL HIGH (ref 4.8–5.6)
Mean Plasma Glucose: >398 mg/dL

## 2021-02-26 NOTE — Telephone Encounter (Signed)
Transition Care Management Follow-up Telephone Call Date of discharge and from where: 02/25/2021, [facility]  How have you been since you were released from the hospital? He said he is " all right" but is upset with how he was cared for at [facility]  Any questions or concerns? Yes- noted above.   Items Reviewed: Did the pt receive and understand the discharge instructions provided? Yes  Medications obtained and verified? No - he has no medications. He understands that he needs to pick up the ketorolac at [facility] and he needs to call his pharmacy to have other medications refilled. Instructed him to call this CM back if he has any questions about the medications or has difficulty getting them filled.  Other? No  Any new allergies since your discharge? No  Dietary orders reviewed? No Do you have support at home?  Homeless but currently staying at [facility] . Had been living on the streets or in the woods.   Home Care and Equipment/Supplies: Were home health services ordered? no If so, what is the name of the agency? N/a  Has the agency set up a time to come to the patient's home? not applicable Were any new equipment or medical supplies ordered?  No What is the name of the medical supply agency? N/a Were you able to get the supplies/equipment? not applicable Do you have any questions related to the use of the equipment or supplies? No  He has a glucometer.  He does not have a RW.   Functional Questionnaire: (I = Independent and D = Dependent) ADLs: independent   Follow up appointments reviewed:  PCP Hospital f/u appt confirmed? Yes  Scheduled to see Dr Margarita Rana  on 03/14/2021 @ 1050. [facility] f/u appt confirmed?  He needs to call urology.  The phone number is on his AVS   Are transportation arrangements needed?  He said he will need a ride. Provided him with the phone number for [employer] and instructed him to call this CM if he has any difficulty  scheduling his rides. The phone number for [employer] and his appointment information for 03/14/2021 was text to him as he requested.  If their condition worsens, is the pt aware to call PCP or go to the Emergency Dept.? Yes Was the patient provided with contact information for the PCP's office or ED? Yes Was to pt encouraged to call back with questions or concerns? Yes

## 2021-02-26 NOTE — Telephone Encounter (Signed)
From the discharge call:   He said he is " all right" but is upset with how he was cared for at [facility]   he has no medications. He understands that he needs to pick up the ketorolac at [facility] and he needs to call his pharmacy to have other medications refilled. Instructed him to call this CM back if he has any questions about the medications or has difficulty getting them filled.   Homeless but currently staying at [facility] . Had been living on the streets or in the woods.   Scheduled to see Dr Margarita Rana  on 03/14/2021 @ 1050.  He needs to call urology for an appointment.  The phone number is on his AVS     He said he will need a ride to appointments. Provided him with the phone number for [employer] and instructed him to call this CM if he has any difficulty scheduling his rides. The phone number for [employer] and his appointment information for 03/14/2021 was text to him as he requested

## 2021-03-01 LAB — ALDOSTERONE + RENIN ACTIVITY W/ RATIO
ALDO / PRA Ratio: 5.5 (ref 0.0–30.0)
Aldosterone: 3.3 ng/dL (ref 0.0–30.0)
PRA LC/MS/MS: 0.603 ng/mL/hr (ref 0.167–5.380)

## 2021-03-01 LAB — METANEPHRINES, PLASMA
Metanephrine, Free: 29 pg/mL (ref 0.0–88.0)
Normetanephrine, Free: 69.8 pg/mL (ref 0.0–285.2)

## 2021-03-08 NOTE — Telephone Encounter (Signed)
Pt called back and was provided the number for [employer].

## 2021-03-08 NOTE — Telephone Encounter (Signed)
Attempted to contact patient # [phone] to confirm that he has transportation arranged to his appointment at [facility] 03/14/2021.  A ride can be arranged through [employer] if needed.  Voicemail was full, unable to leave a message.

## 2021-03-09 ENCOUNTER — Ambulatory Visit: Payer: Self-pay

## 2021-03-09 ENCOUNTER — Other Ambulatory Visit: Payer: Self-pay | Admitting: *Deleted

## 2021-03-09 ENCOUNTER — Other Ambulatory Visit: Payer: Self-pay

## 2021-03-09 MED ORDER — GLUCOSE BLOOD VI STRP
1.0000 | ORAL_STRIP | Freq: Two times a day (BID) | 2 refills | Status: DC
Start: 2021-03-09 — End: 2021-03-22
  Filled 2021-03-09: qty 50, 25d supply, fill #0

## 2021-03-09 NOTE — Telephone Encounter (Signed)
Meets criteria for refill on strips with no refills.  Attempt to call patient to schedule DM f/u appt since last OV was 12/2020. No answer or voicemail set up.

## 2021-03-09 NOTE — Telephone Encounter (Signed)
°  Chief Complaint: BS > 500 Symptoms: BS >500, feeling hot Frequency: today Pertinent Negatives: NA Disposition: [x] ED /[] Urgent Care (no appt availability in office) / [] Appointment(In office/virtual)/ []  [place] [facility]/ [] Home Care/ [] Refused Recommended Disposition /[] [place] [facility]/ []  Follow-up with PCP Additional Notes: Pt was advised ED but he refused stating he doesn't have a way there. Explained EMS can transport to ED but pt stated he wouldn't worried about that he just needed a refill on his test strips. Encouraged getting assistance was more important and explained dangers of DKA but pt still just asking for refill.    Reason for Disposition  Blood glucose > 500 mg/dL (27.8 mmol/L)  Answer Assessment - Initial Assessment Questions 1. BLOOD GLUCOSE: "What is your blood glucose level?"      Over 500 2. ONSET: "When did you check the blood glucose?"     Longer than an hr 3. USUAL RANGE: "What is your glucose level usually?" (e.g., usual fasting morning value, usual evening value)     high 5. TYPE 1 or 2:  "Do you know what type of diabetes you have?"  (e.g., Type 1, Type 2, Gestational; doesn't know)      DM2 6. INSULIN: "Do you take insulin?" "What type of insulin(s) do you use? What is the mode of delivery? (syringe, pen; injection or pump)?"      Yes  7. DIABETES PILLS: "Do you take any pills for your diabetes?" If yes, ask: "Have you missed taking any pills recently?"     Yes  8. OTHER SYMPTOMS: "Do you have any symptoms?" (e.g., fever, frequent urination, difficulty breathing, dizziness, weakness, vomiting)     Feeling hot  Protocols used: Diabetes - High Blood Sugar-A-AH

## 2021-03-12 ENCOUNTER — Other Ambulatory Visit: Payer: Self-pay

## 2021-03-13 ENCOUNTER — Telehealth: Payer: Self-pay

## 2021-03-13 NOTE — Telephone Encounter (Signed)
Call placed to patient to inquire if has a ride to his appointment at [facility] tomorrow and does not have a ride.  Ride then arranged through [employer].  Ride ID # [id number]. Informed the patient that he will be picked up at 1008 at his home address - [place]. He said he would be ready by 1000.   Regarding his test strips, informed him that the order has been sent to [facility].

## 2021-03-14 ENCOUNTER — Ambulatory Visit: Payer: Medicaid Other | Admitting: Family Medicine

## 2021-03-14 NOTE — Telephone Encounter (Signed)
Call returned to patient # [phone]. Message left with call back requested to this CM. This CM received a message from [employer] noting that the patient was a " no show" for his ride this morning.

## 2021-03-14 NOTE — Telephone Encounter (Signed)
Pt called back very upset stated the uber for his appointment never showed up and he missed the appointment because of it. Offered to reschedule appointment pt stated he only wanted to see PCP Margarita Rana, PCP did not have an appointment available soon.  Pt declined to reschedule.  Pt requested call back from [place].

## 2021-03-14 NOTE — Telephone Encounter (Signed)
Pt called back.  He would like a call back from [place]. He said the driver never showed up today.

## 2021-03-15 NOTE — Telephone Encounter (Signed)
Attempted to contact patient to discuss rescheduling follow up.  Call placed to # [phone] and voicemail was full, unable to leave a message.

## 2021-03-21 ENCOUNTER — Ambulatory Visit: Payer: Self-pay | Admitting: *Deleted

## 2021-03-21 ENCOUNTER — Telehealth: Payer: Self-pay | Admitting: Family Medicine

## 2021-03-21 NOTE — Telephone Encounter (Signed)
°  Chief Complaint: reports high blood sugar, frequent urination , weight loss. And no transportation to come to OV  Symptoms: frequent urination , weight loss . Right side numbness that has been present for "a while". Has been seen for right side numbness per patient  Frequency: x 1 week  Pertinent Negatives: Patient denies abdominal pain , burning urination, odor . No headache, dizziness, blurred vision at this time . Double vision 3 days ago  Disposition: [x] ED /[] Urgent Care (no appt availability in office) / [] Appointment(In office/virtual)/ []  [place] [facility]/ [] Home Care/ [] Refused Recommended Disposition /[]  Mobile Bus/ []  Follow-up with PCP Additional Notes:  Recommended patient go to ED or call 911 to check blood sugar and assess other sx . Patient declined and said he would come to mobile bus if he could get transportation.  Reports he has not taken blood sugar and has not been taking medications. Please advise        Reason for Disposition  [1] Symptoms of high blood sugar (e.g., frequent urination, weak, weight loss) AND [2] not able to test blood glucose  Answer Assessment - Initial Assessment Questions 1. BLOOD GLUCOSE: "What is your blood glucose level?"      Patient did not check blood sugar 2. ONSET: "When did you check the blood glucose?"     Na  3. USUAL RANGE: "What is your glucose level usually?" (e.g., usual fasting morning value, usual evening value)     na 4. KETONES: "Do you check for ketones (urine or blood test strips)?" If yes, ask: "What does the test show now?"      na 5. TYPE 1 or 2:  "Do you know what type of diabetes you have?"  (e.g., Type 1, Type 2, Gestational; doesn't know)      na 6. INSULIN: "Do you take insulin?" "What type of insulin(s) do you use? What is the mode of delivery? (syringe, pen; injection or pump)?"      Not taking medication did not report how long  7. DIABETES PILLS: "Do you take any pills for your diabetes?" If  yes, ask: "Have you missed taking any pills recently?"     Did not report how long not taken 8. OTHER SYMPTOMS: "Do you have any symptoms?" (e.g., fever, frequent urination, difficulty breathing, dizziness, weakness, vomiting)     Frequent urination approx. Every 15 minutes  9. PREGNANCY: "Is there any chance you are pregnant?" "When was your last menstrual period?"     na  Protocols used: Diabetes - High Blood Sugar-A-AH

## 2021-03-21 NOTE — Telephone Encounter (Signed)
error 

## 2021-03-22 ENCOUNTER — Telehealth: Payer: Self-pay

## 2021-03-22 ENCOUNTER — Other Ambulatory Visit: Payer: Self-pay

## 2021-03-22 ENCOUNTER — Ambulatory Visit: Payer: Medicaid Other | Admitting: Physician Assistant

## 2021-03-22 ENCOUNTER — Telehealth: Payer: Self-pay | Admitting: Family Medicine

## 2021-03-22 ENCOUNTER — Inpatient Hospital Stay: Payer: Medicaid Other | Admitting: Physician Assistant

## 2021-03-22 VITALS — BP 148/91 | HR 84 | Temp 98.2°F | Resp 18 | Ht 68.0 in | Wt 141.0 lb

## 2021-03-22 DIAGNOSIS — M5126 Other intervertebral disc displacement, lumbar region: Secondary | ICD-10-CM

## 2021-03-22 DIAGNOSIS — E1165 Type 2 diabetes mellitus with hyperglycemia: Secondary | ICD-10-CM | POA: Diagnosis not present

## 2021-03-22 DIAGNOSIS — N2889 Other specified disorders of kidney and ureter: Secondary | ICD-10-CM

## 2021-03-22 DIAGNOSIS — E1149 Type 2 diabetes mellitus with other diabetic neurological complication: Secondary | ICD-10-CM | POA: Diagnosis not present

## 2021-03-22 DIAGNOSIS — I10 Essential (primary) hypertension: Secondary | ICD-10-CM | POA: Diagnosis not present

## 2021-03-22 LAB — POCT URINALYSIS DIP (CLINITEK)
Bilirubin, UA: NEGATIVE
Glucose, UA: >=1000 mg/dL — AB
Ketones, POC UA: NEGATIVE mg/dL
Leukocytes, UA: NEGATIVE
Nitrite, UA: NEGATIVE
POC PROTEIN,UA: NEGATIVE
Spec Grav, UA: 1.015 (ref 1.010–1.025)
Urobilinogen, UA: 0.2 E.U./dL
pH, UA: 5.5 (ref 5.0–8.0)

## 2021-03-22 LAB — GLUCOSE, POCT (MANUAL RESULT ENTRY): POC Glucose: 512 mg/dl — AB (ref 70–99)

## 2021-03-22 MED ORDER — METFORMIN HCL 1000 MG PO TABS
1000.0000 mg | ORAL_TABLET | Freq: Two times a day (BID) | ORAL | 6 refills | Status: DC
  Filled 2021-03-22: qty 180, 90d supply, fill #0

## 2021-03-22 MED ORDER — ATORVASTATIN CALCIUM 40 MG PO TABS
40.0000 mg | ORAL_TABLET | Freq: Every day | ORAL | 6 refills | Status: DC
  Filled 2021-03-22: qty 90, 90d supply, fill #0
  Filled 2021-08-23: qty 90, 90d supply, fill #1

## 2021-03-22 MED ORDER — GLUCOSE BLOOD VI STRP
1.0000 | ORAL_STRIP | Freq: Two times a day (BID) | 2 refills | Status: DC
  Filled 2021-03-22: qty 100, 34d supply, fill #0

## 2021-03-22 MED ORDER — AMLODIPINE BESYLATE 10 MG PO TABS
10.0000 mg | ORAL_TABLET | Freq: Every day | ORAL | 6 refills | Status: DC
  Filled 2021-03-22: qty 90, 90d supply, fill #0

## 2021-03-22 MED ORDER — ACCU-CHEK SOFTCLIX LANCETS MISC
6 refills | Status: DC
  Filled 2021-03-22: qty 100, 30d supply, fill #0

## 2021-03-22 MED ORDER — INSULIN ASPART 100 UNIT/ML IJ SOLN: 20.0000 [IU] | Freq: Once | INTRAMUSCULAR | Status: DC

## 2021-03-22 MED ORDER — ONDANSETRON HCL 4 MG PO TABS
4.0000 mg | ORAL_TABLET | Freq: Four times a day (QID) | ORAL | 0 refills | Status: DC
  Filled 2021-03-22: qty 12, 3d supply, fill #0

## 2021-03-22 MED ORDER — GLIPIZIDE ER 10 MG PO TB24
10.0000 mg | ORAL_TABLET | Freq: Every day | ORAL | 6 refills | Status: DC
  Filled 2021-03-22: qty 90, 90d supply, fill #0

## 2021-03-22 MED ORDER — GABAPENTIN 300 MG PO CAPS
600.0000 mg | ORAL_CAPSULE | Freq: Three times a day (TID) | ORAL | 6 refills | Status: DC
  Filled 2021-03-22: qty 540, 90d supply, fill #0
  Filled 2021-08-23: qty 540, 90d supply, fill #1

## 2021-03-22 MED ORDER — EMPAGLIFLOZIN 10 MG PO TABS
10.0000 mg | ORAL_TABLET | Freq: Every day | ORAL | 6 refills | Status: DC
  Filled 2021-03-22: qty 30, 30d supply, fill #0

## 2021-03-22 MED ORDER — DULOXETINE HCL 60 MG PO CPEP
60.0000 mg | ORAL_CAPSULE | Freq: Every day | ORAL | 6 refills | Status: DC
  Filled 2021-03-22: qty 90, 90d supply, fill #0
  Filled 2021-08-23: qty 90, 90d supply, fill #1

## 2021-03-22 MED ORDER — LIRAGLUTIDE 18 MG/3ML ~~LOC~~ SOPN
PEN_INJECTOR | SUBCUTANEOUS | 6 refills | Status: DC
Start: 2021-03-22 — End: 2021-06-23
  Filled 2021-03-22: qty 6, 30d supply, fill #0

## 2021-03-22 MED ORDER — INSULIN PEN NEEDLE 32G X 4 MM MISC
0 refills | Status: DC
  Filled 2021-03-22: qty 100, 30d supply, fill #0

## 2021-03-22 NOTE — Telephone Encounter (Signed)
Requesting referral to Nutrition.

## 2021-03-22 NOTE — Telephone Encounter (Signed)
Error

## 2021-03-22 NOTE — Progress Notes (Signed)
Patient has eaten and taken medication today. Patient reports homelessness and needing education on diet and medication pickup.

## 2021-03-22 NOTE — Progress Notes (Signed)
Established Patient Office Visit  Subjective:  Patient ID: David Wyatt, male    DOB: 1960-09-20  Age: 61 y.o. MRN: [id number]  CC:  Chief Complaint  Patient presents with   Hyperglycemia    HPI David Wyatt reports that he was recently hospitalized from December 16 through February 25, 2021.  Hospital course:  Recommendations for Outpatient Follow-up:  Follow up with PCP in 1-2 weeks Follow up with Urology as scheduled   Discharge Condition:Stable CODE STATUS:Full Diet recommendation: Diabetic    Brief/Interim Summary:  61 y.o. male with medical history significant of ACE inhibitor induced angioedema, osteoarthritis, chronic back pain, history of cocaine abuse, tobacco use, diabetic neuropathy, type II DM, hyperlipidemia, hypertension who is coming to the emergency department due to right flank pain for the fourth time in the past 7 days.Pt was found to have ureteral stones on imaging.  Urology was consulted.   Discharge Diagnoses:  Principal Problem:   Hydronephrosis of right kidney Active Problems:   Chronic back pain   Essential hypertension, benign   Diabetic neuropathy ([place])   Hyperlipidemia   Type 2 diabetes mellitus with hyperglycemia (HCC)   Principal Problem:   Hydronephrosis of right kidney Pt was continued with IVF Continued with analgesics as needed. Nursing documentation reviewed. Pt did not require much narcotic post-procedure No signs of infection/antibiotics deferred at this time. MRI to kidney reviewed, findings concerning for renal mass, see below   Active Problems:   Type 2 diabetes mellitus with hyperglycemia (HCC) Continue IV fluids. Carbohydrate modified diet postop.     Chronic back pain Analgesics as needed. Muscle relaxants as needed. Discussed with nursing, concerns for unsteadiness on feet.  Staff concerned that patient will be considered possible fall risk Cleared by PT     Essential hypertension, benign Continued amlodipine 10 mg  p.o. daily.     Diabetic neuropathy ([place]) Has not been taking gabapentin.     Hyperlipidemia Currently not on any medication.   Renal mass -MRI reviewed.  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 -Would have patient follow-up closely with urology    States today that he has not had any medications since he left the hospital, states he is unable to afford the co-pay, and has difficulty with transportation.  States that he has not followed up with urology.  States that he feels fatigued, but otherwise denies any hyperglycemic symptoms   Past Medical History:  Diagnosis Date   Angioedema of lips 07/28/2012   left upper (07/29/2012)   Arthritis    hands and back   Chronic back pain    Cocaine abuse (HCC)    Diabetic neuropathy (HCC)    High cholesterol    Hypertension    Neuropathy    Type II diabetes mellitus ([place])     Past Surgical History:  Procedure Laterality Date   BACK SURGERY     CYSTOSCOPY W/ URETERAL STENT PLACEMENT Right 02/23/2021   Procedure: CYSTOSCOPY WITH RETROGRADE PYELOGRAM/URETERAL STENT PLACEMENT;  Surgeon: Janith Lima, MD;  Location: [facility];  Service: Urology;  Laterality: Right;   HERNIA REPAIR Right 04/01/2012   I & D EXTREMITY Left 12/13/2012   Procedure: IRRIGATION AND DEBRIDEMENT LEFT THUMB;  Surgeon: Tennis Must, MD;  Location: [place];  Service: Orthopedics;  Laterality: Left;   INCISION AND DRAINAGE OF WOUND     boil on back/notes 07/14/2008 (  07/29/2012)   INGUINAL HERNIA REPAIR  04/01/2012   Procedure: HERNIA REPAIR INGUINAL ADULT;  Surgeon: Haywood Lasso, MD;  Location: [place];  Service: General;  Laterality: Right;   INGUINAL HERNIA REPAIR Left 09/06/2016   Procedure: OPEN REPAIR LEFT INGUINAL HERNIA;  Surgeon: Greer Pickerel, MD;  Location: [place];  Service: General;  Laterality: Left;   INSERTION OF MESH Left 09/06/2016    Procedure: INSERTION OF MESH;  Surgeon: Greer Pickerel, MD;  Location: [place];  Service: General;  Laterality: Left;   TONSILLECTOMY      Family History  Problem Relation Age of Onset   Diabetes Mother    Kidney disease Mother    Hyperlipidemia Mother    Hyperlipidemia Father    Diabetes Father     Social History   Socioeconomic History   Marital status: Divorced    Spouse name: Not on file   Number of children: 3   Years of education: 12   Highest education level: Not on file  Occupational History    Comment: disabled  Tobacco Use   Smoking status: Every Day    Packs/day: 0.25    Years: 30.00    Pack years: 7.50    Types: Cigarettes   Smokeless tobacco: Former    Quit date: 08/15/2015   Tobacco comments:    04/29/16 2  cigs daily, 10/27/17 sometimes < .25 PPD  Vaping Use   Vaping Use: Never used  Substance and Sexual Activity   Alcohol use: No    Alcohol/week: 0.0 standard drinks   Drug use: Yes    Types: "Crack" cocaine   Sexual activity: Not on file  Other Topics Concern   Not on file  Social History Narrative   04/29/17   Lives in shelter   Caffeine- a lot of  tea, coffee   Social Determinants of Health   Financial Resource Strain: Not on file  Food Insecurity: Not on file  Transportation Needs: Not on file  Physical Activity: Not on file  Stress: Not on file  Social Connections: Not on file  Intimate Partner Violence: Not on file    Outpatient Medications Prior to Visit  Medication Sig Dispense Refill   Blood Glucose Monitoring Suppl (ACCU-CHEK GUIDE) w/Device KIT USE AS DIRECTED TO CHECK BLOOD SUGARS UP TO TWICE PER DAY (Patient taking differently: 1 each by Other route 2 (two) times daily.) 1 kit 0   Blood Glucose Monitoring Suppl (ACCU-CHEK GUIDE) w/Device KIT Use as directed (Patient taking differently: 1 each by Other route 2 (two) times daily.) 1 kit 0   cyclobenzaprine (FLEXERIL) 10 MG tablet Take 1 tablet (10 mg total) by mouth 2 (two) times daily as  needed for muscle spasms. 40 tablet 6   Accu-Chek Softclix Lancets lancets Test blood sugar 2 times daily (Patient taking differently: 1 each by Other route See admin instructions. Test blood sugar 2 times daily) 100 each 6   amLODipine (NORVASC) 10 MG tablet Take 1 tablet (10 mg total) by mouth daily. 30 tablet 6   atorvastatin (LIPITOR) 40 MG tablet Take 1 tablet (40 mg total) by mouth daily. 30 tablet 6   DULoxetine (CYMBALTA) 60 MG capsule Take 1 capsule (60 mg total) by mouth daily. 30 capsule 6   empagliflozin (JARDIANCE) 10 MG TABS tablet Take 1 tablet (10 mg total) by mouth daily before breakfast. 30 tablet 6   gabapentin (NEURONTIN) 300 MG capsule Take 2 capsules (600 mg total) by mouth 3 (three) times daily.  180 capsule 6   glipiZIDE (GLUCOTROL XL) 10 MG 24 hr tablet Take 1 tablet (10 mg total) by mouth daily with breakfast. 30 tablet 6   glucose blood test strip Test twice daily 100 each 2   Insulin Pen Needle 32G X 4 MM MISC Use with Victoza (Patient taking differently: 1 each by Other route See admin instructions. Use with Victoza) 100 each 0   ketorolac (TORADOL) 10 MG tablet Take 1 tablet (10 mg total) by mouth every 6 (six) hours as needed. 20 tablet 0   liraglutide (VICTOZA) 18 MG/3ML SOPN Inject 0.6 mg once daily (Patient not taking: Reported on 12/30/2020) 36 mL 6   metFORMIN (GLUCOPHAGE) 1000 MG tablet Take 1 tablet (1,000 mg total) by mouth 2 (two) times daily. 60 tablet 6   ondansetron (ZOFRAN) 4 MG tablet Take 1 tablet (4 mg total) by mouth every 6 (six) hours. (Patient taking differently: Take 4 mg by mouth every 6 (six) hours as needed for nausea or vomiting.) 12 tablet 0   No facility-administered medications prior to visit.    Allergies  Allergen Reactions   Lisinopril Other (See Comments)    Lip Swelling, angioedema    ROS Review of Systems  Constitutional:  Positive for fatigue. Negative for chills and fever.  HENT: Negative.    Eyes: Negative.   Respiratory:   Negative for shortness of breath.   Cardiovascular:  Negative for chest pain.  Gastrointestinal: Negative.   Endocrine: Negative.   Genitourinary: Negative.   Musculoskeletal: Negative.   Skin: Negative.   Allergic/Immunologic: Negative.   Neurological:  Negative for syncope, weakness and headaches.  Hematological: Negative.   Psychiatric/Behavioral: Negative.       Objective:    Physical Exam Vitals and nursing note reviewed.  Constitutional:      General: He is not in acute distress.    Appearance: He is not ill-appearing.  HENT:     Head: Normocephalic and atraumatic.     Right Ear: External ear normal.     Left Ear: External ear normal.     Nose: Nose normal.     Mouth/Throat:     Mouth: Mucous membranes are moist.     Pharynx: Oropharynx is clear.  Eyes:     Extraocular Movements: Extraocular movements intact.     Conjunctiva/sclera: Conjunctivae normal.     Pupils: Pupils are equal, round, and reactive to light.  Cardiovascular:     Rate and Rhythm: Normal rate and regular rhythm.     Pulses: Normal pulses.     Heart sounds: Normal heart sounds.  Pulmonary:     Effort: Pulmonary effort is normal.     Breath sounds: Normal breath sounds.  Musculoskeletal:        General: Normal range of motion.     Cervical back: Normal range of motion and neck supple.  Skin:    General: Skin is warm and dry.  Neurological:     General: No focal deficit present.     Mental Status: He is alert and oriented to person, place, and time.  Psychiatric:        Mood and Affect: Mood normal.        Behavior: Behavior normal.        Thought Content: Thought content normal.        Judgment: Judgment normal.    BP (!) 148/91 (BP Location: Left Arm, Patient Position: Sitting, Cuff Size: Normal)    Pulse 84    Temp  98.2 F (36.8 C) (Oral)    Resp 18    Ht 5' 8"  (1.727 m)    Wt 141 lb (64 kg)    BMI 21.44 kg/m  Wt Readings from Last 3 Encounters:  03/22/21 141 lb (64 kg)  02/23/21  149 lb 14.6 oz (68 kg)  02/19/21 150 lb (68 kg)     Health Maintenance Due  Topic Date Due   COVID-19 Vaccine (1) Never done   OPHTHALMOLOGY EXAM  Never done   COLONOSCOPY (Pts 45-67yr Insurance coverage will need to be confirmed)  Never done   Zoster Vaccines- Shingrix (1 of 2) Never done   Pneumococcal Vaccine [id number] old (2 - PCV) 06/06/2018   INFLUENZA VACCINE  10/09/2020    There are no preventive care reminders to display for this patient.  No results found for: TSH Lab Results  Component Value Date   WBC 7.6 02/25/2021   HGB 12.4 (L) 02/25/2021   HCT 36.7 (L) 02/25/2021   MCV 92.7 02/25/2021   PLT 418 (H) 02/25/2021   Lab Results  Component Value Date   NA 134 (L) 02/25/2021   K 3.8 02/25/2021   CO2 25 02/25/2021   GLUCOSE 246 (H) 02/25/2021   BUN 18 02/25/2021   CREATININE 0.84 02/25/2021   BILITOT 0.6 02/25/2021   ALKPHOS 65 02/25/2021   AST 10 (L) 02/25/2021   ALT 10 02/25/2021   PROT 6.1 (L) 02/25/2021   ALBUMIN 2.7 (L) 02/25/2021   CALCIUM 8.4 (L) 02/25/2021   ANIONGAP 6 02/25/2021   Lab Results  Component Value Date   CHOL 194 11/03/2019   Lab Results  Component Value Date   HDL 60 11/03/2019   Lab Results  Component Value Date   LDLCALC 99 11/03/2019   Lab Results  Component Value Date   TRIG 204 (H) 11/03/2019   Lab Results  Component Value Date   CHOLHDL 3.2 11/03/2019   Lab Results  Component Value Date   HGBA1C >15.5 (H) 02/24/2021      Assessment & Plan:   Problem List Items Addressed This Visit       Cardiovascular and Mediastinum   Essential hypertension, benign   Relevant Medications   amLODipine (NORVASC) 10 MG tablet   atorvastatin (LIPITOR) 40 MG tablet     Endocrine   Diabetic neuropathy (HCC)   Relevant Medications   atorvastatin (LIPITOR) 40 MG tablet   empagliflozin (JARDIANCE) 10 MG TABS tablet   gabapentin (NEURONTIN) 300 MG capsule   glipiZIDE (GLUCOTROL XL) 10 MG 24 hr tablet   liraglutide  (VICTOZA) 18 MG/3ML SOPN   metFORMIN (GLUCOPHAGE) 1000 MG tablet   Type 2 diabetes mellitus with hyperglycemia (HCC) - Primary   Relevant Medications   atorvastatin (LIPITOR) 40 MG tablet   empagliflozin (JARDIANCE) 10 MG TABS tablet   glipiZIDE (GLUCOTROL XL) 10 MG 24 hr tablet   liraglutide (VICTOZA) 18 MG/3ML SOPN   metFORMIN (GLUCOPHAGE) 1000 MG tablet   Other Relevant Orders   POCT URINALYSIS DIP (CLINITEK) (Completed)   Glucose (CBG) (Completed)     Musculoskeletal and Integument   Lumbar disc herniation   Relevant Medications   DULoxetine (CYMBALTA) 60 MG capsule   gabapentin (NEURONTIN) 300 MG capsule   Other Visit Diagnoses     Uncontrolled type 2 diabetes mellitus with hyperglycemia (HCC)       Relevant Medications   atorvastatin (LIPITOR) 40 MG tablet   empagliflozin (JARDIANCE) 10 MG TABS tablet   glipiZIDE (GLUCOTROL XL)  10 MG 24 hr tablet   liraglutide (VICTOZA) 18 MG/3ML SOPN   metFORMIN (GLUCOPHAGE) 1000 MG tablet       Meds ordered this encounter  Medications   Accu-Chek Softclix Lancets lancets    Sig: Test blood sugar 2 times daily    Dispense:  100 each    Refill:  6    Order Specific Question:   Supervising Provider    Answer:   Asencion Noble E [[id number]]   amLODipine (NORVASC) 10 MG tablet    Sig: Take 1 tablet (10 mg total) by mouth daily.    Dispense:  30 tablet    Refill:  6    Order Specific Question:   Supervising Provider    Answer:   Asencion Noble E [[id number]]   atorvastatin (LIPITOR) 40 MG tablet    Sig: Take 1 tablet (40 mg total) by mouth daily.    Dispense:  30 tablet    Refill:  6    Order Specific Question:   Supervising Provider    Answer:   Asencion Noble E [[id number]]   DULoxetine (CYMBALTA) 60 MG capsule    Sig: Take 1 capsule (60 mg total) by mouth daily.    Dispense:  30 capsule    Refill:  6    Order Specific Question:   Supervising Provider    Answer:   Elsie Stain [[id number]]   empagliflozin (JARDIANCE) 10 MG TABS tablet     Sig: Take 1 tablet (10 mg total) by mouth daily before breakfast.    Dispense:  30 tablet    Refill:  6    Order Specific Question:   Supervising Provider    Answer:   Asencion Noble E [[id number]]   gabapentin (NEURONTIN) 300 MG capsule    Sig: Take 2 capsules (600 mg total) by mouth 3 (three) times daily.    Dispense:  180 capsule    Refill:  6    Discontinue previous dose    Order Specific Question:   Supervising Provider    Answer:   Asencion Noble E [[id number]]   glipiZIDE (GLUCOTROL XL) 10 MG 24 hr tablet    Sig: Take 1 tablet (10 mg total) by mouth daily with breakfast.    Dispense:  30 tablet    Refill:  6    Order Specific Question:   Supervising Provider    Answer:   Joya Gaskins, PATRICK E [[id number]]   glucose blood test strip    Sig: Test twice daily    Dispense:  100 each    Refill:  2    Order Specific Question:   Supervising Provider    Answer:   Asencion Noble E [[id number]]   Insulin Pen Needle 32G X 4 MM MISC    Sig: Use with Victoza    Dispense:  100 each    Refill:  0    Order Specific Question:   Supervising Provider    Answer:   Joya Gaskins, PATRICK E [[id number]]   liraglutide (VICTOZA) 18 MG/3ML SOPN    Sig: Inject 0.6 mg once daily    Dispense:  36 mL    Refill:  6    Order Specific Question:   Supervising Provider    Answer:   Elsie Stain [[id number]]   metFORMIN (GLUCOPHAGE) 1000 MG tablet    Sig: Take 1 tablet (1,000 mg total) by mouth 2 (two) times daily.    Dispense:  60 tablet    Refill:  6  Order Specific Question:   Supervising Provider    Answer:   Asencion Noble E [[id number]]   ondansetron (ZOFRAN) 4 MG tablet    Sig: Take 1 tablet (4 mg total) by mouth every 6 (six) hours.    Dispense:  12 tablet    Refill:  0    Order Specific Question:   Supervising Provider    Answer:   WRIGHT, PATRICK E [[id number]]   1. Uncontrolled type 2 diabetes mellitus with hyperglycemia (HCC) Restart all medications.  This provider went through all medications with patient, wrote down when to  take them and what they were for.  [facility] with [facility] constraints.  Red flags given for prompt reevaluation - atorvastatin (LIPITOR) 40 MG tablet; Take 1 tablet (40 mg total) by mouth daily.  Dispense: 30 tablet; Refill: 6 - empagliflozin (JARDIANCE) 10 MG TABS tablet; Take 1 tablet (10 mg total) by mouth daily before breakfast.  Dispense: 30 tablet; Refill: 6 - glipiZIDE (GLUCOTROL XL) 10 MG 24 hr tablet; Take 1 tablet (10 mg total) by mouth daily with breakfast.  Dispense: 30 tablet; Refill: 6 - liraglutide (VICTOZA) 18 MG/3ML SOPN; Inject 0.6 mg once daily  Dispense: 36 mL; Refill: 6 - metFORMIN (GLUCOPHAGE) 1000 MG tablet; Take 1 tablet (1,000 mg total) by mouth 2 (two) times daily.  Dispense: 60 tablet; Refill: 6 - insulin aspart (novoLOG) injection 20 Units  2. Type 2 diabetes mellitus with hyperglycemia, unspecified whether long term insulin use (HCC)  - POCT URINALYSIS DIP (CLINITEK) - Glucose (CBG)  3. Other diabetic neurological complication associated with type 2 diabetes mellitus (HCC)  - gabapentin (NEURONTIN) 300 MG capsule; Take 2 capsules (600 mg total) by mouth 3 (three) times daily.  Dispense: 180 capsule; Refill: 6  4. Essential hypertension, benign  - amLODipine (NORVASC) 10 MG tablet; Take 1 tablet (10 mg total) by mouth daily.  Dispense: 30 tablet; Refill: 6  5. Lumbar disc herniation  - DULoxetine (CYMBALTA) 60 MG capsule; Take 1 capsule (60 mg total) by mouth daily.  Dispense: 30 capsule; Refill: 6 - gabapentin (NEURONTIN) 300 MG capsule; Take 2 capsules (600 mg total) by mouth 3 (three) times daily.  Dispense: 180 capsule; Refill: 6  6. Left kidney mass  - Ambulatory referral to Urology   I have reviewed the patient's medical history (PMH, PSH, Social History, Family History, Medications, and allergies) , and have been updated if relevant. I spent 30 minutes reviewing chart and  face to face  time with patient.    Follow-up: No follow-ups on file.    Loraine Grip Mayers, PA-C

## 2021-03-22 NOTE — Telephone Encounter (Signed)
Left message on voicemail to return call.  Trying to assist patient with an appt. Today.

## 2021-03-22 NOTE — Telephone Encounter (Signed)
Pt request referral be sent to a nutritionist  . He said he not sure of what he should be eating to keep his blood sugar down. Advised PCP may be able to suggest diet.

## 2021-03-23 ENCOUNTER — Other Ambulatory Visit: Payer: Self-pay

## 2021-03-23 NOTE — Telephone Encounter (Signed)
Pt was called and a VM was left informing patient to return phone call.

## 2021-03-27 ENCOUNTER — Emergency Department (HOSPITAL_COMMUNITY)
Admission: EM | Admit: 2021-03-27 | Discharge: 2021-03-27 | Disposition: A | Discharge status: Home / Self Care | Payer: Medicaid Other | Attending: Emergency Medicine | Admitting: Emergency Medicine

## 2021-03-27 ENCOUNTER — Encounter (HOSPITAL_COMMUNITY): Payer: Self-pay | Admitting: Emergency Medicine

## 2021-03-27 ENCOUNTER — Other Ambulatory Visit: Payer: Self-pay

## 2021-03-27 ENCOUNTER — Ambulatory Visit: Payer: Self-pay | Admitting: *Deleted

## 2021-03-27 ENCOUNTER — Telehealth: Payer: Self-pay

## 2021-03-27 DIAGNOSIS — I10 Essential (primary) hypertension: Secondary | ICD-10-CM | POA: Insufficient documentation

## 2021-03-27 DIAGNOSIS — E1165 Type 2 diabetes mellitus with hyperglycemia: Secondary | ICD-10-CM | POA: Diagnosis present

## 2021-03-27 DIAGNOSIS — R739 Hyperglycemia, unspecified: Secondary | ICD-10-CM

## 2021-03-27 DIAGNOSIS — Z79899 Other long term (current) drug therapy: Secondary | ICD-10-CM | POA: Insufficient documentation

## 2021-03-27 DIAGNOSIS — Z7984 Long term (current) use of oral hypoglycemic drugs: Secondary | ICD-10-CM | POA: Diagnosis not present

## 2021-03-27 DIAGNOSIS — R42 Dizziness and giddiness: Secondary | ICD-10-CM

## 2021-03-27 DIAGNOSIS — Z794 Long term (current) use of insulin: Secondary | ICD-10-CM | POA: Diagnosis not present

## 2021-03-27 LAB — BASIC METABOLIC PANEL
Anion gap: 13 (ref 5–15)
BUN: 20 mg/dL (ref 8–23)
CO2: 21 mmol/L — ABNORMAL LOW (ref 22–32)
Calcium: 9.5 mg/dL (ref 8.9–10.3)
Chloride: 101 mmol/L (ref 98–111)
Creatinine, Ser: 1.23 mg/dL (ref 0.61–1.24)
GFR, Estimated: >60 mL/min (ref >60)
Glucose, Bld: 240 mg/dL — ABNORMAL HIGH (ref 70–99)
Potassium: 4.8 mmol/L (ref 3.5–5.1)
Sodium: 135 mmol/L (ref 135–145)

## 2021-03-27 LAB — CBC
HCT: 46.6 % (ref 39.0–52.0)
Hemoglobin: 15.4 g/dL (ref 13.0–17.0)
MCH: 31.4 pg (ref 26.0–34.0)
MCHC: 33 g/dL (ref 30.0–36.0)
MCV: 94.9 fL (ref 80.0–100.0)
Platelets: 376 10*3/uL (ref 150–400)
RBC: 4.91 MIL/uL (ref 4.22–5.81)
RDW: 12.8 % (ref 11.5–15.5)
WBC: 6.3 10*3/uL (ref 4.0–10.5)
nRBC: 0 % (ref 0.0–0.2)

## 2021-03-27 LAB — CBG MONITORING, ED
Glucose-Capillary: 201 mg/dL — ABNORMAL HIGH (ref 70–99)
Glucose-Capillary: 200 mg/dL — ABNORMAL HIGH (ref 70–99)

## 2021-03-27 NOTE — ED Triage Notes (Signed)
Patient who is well known to [facility] for evaluation of hyperglycemia for the last several months. Patient states he has not changed any routine with his diabetic medications but his blood sugars are still elevated. Patient alert, oriented, and in no apparent distress at this time. Patient denies dizziness, denies vision changes. EMS Vitals 156/104 HR 100 RR 18 SpO2 100% CBG 249 Oral temp 98.4 F

## 2021-03-27 NOTE — Telephone Encounter (Signed)
Patient currently in ED. Call placed to Fuller Mandril, RN CM and inquired if patient can be given the jardiance and farxiga from the [facility] when he is ready for discharge.  She will see what can be done to assist patient.

## 2021-03-27 NOTE — Telephone Encounter (Signed)
Patient called back: Summary: blood sugar   The patient would like to speak with a member of staff about their blood sugar   It is currently fluctuating between 218 and 280   Please contact further when available     Patient is concerned about his glucose- he is taking medication and his sugar is still high- 218 now and dizzy. Patient is very anxious and worried. Advised patient go to ED if he is having symptoms and feels faint-he agrees and will go to ED either by EMS or other transportation.

## 2021-03-27 NOTE — Care Management (Signed)
ED CM met with patient in [facility]022 to discussed issue concerning medications which were according to the [facility] mailed to [place].  Patient reports he picked up medication last week form the [facility] pharmacy and was waiting for  prior auth on 2 others  jardiance and farxiga.   RNCM  explained that these were sent out today according to the pharmacist, so he should look out for them sometime tomorrow.  Patient explained that he was living in a motel, RNCM asked if they were working on more permanent housing, patient stated he has not heard anything. RNCM advised him to contact his Case Worker at [employer]. Patient stated that he does not have transportation.  Patient was given 2 bus passed to get there tomorrow. Also discussed prescription co-pays according to [facility] Medicaid. Patient states he is aware and he will pay the balance when he receives his check .   Updated EDP on plan and patient will be discharged and transported back to the [facility] by [employer] transportation. Updated Kathlee Nations RN in the ED.  No further ED RNCM needs identified.

## 2021-03-27 NOTE — ED Provider Notes (Signed)
[facility] EMERGENCY DEPARTMENT Provider Note   CSN: [id number] Arrival date & time: 03/27/21  1257     History  Chief Complaint  Patient presents with   Hyperglycemia    David Wyatt is a 61 y.o. male history of diabetes, hypertension here presenting with hyperglycemia and dizziness.  Patient states that he is on multiple meds for diabetes.  He is not exactly clear what he is on but per chart review, he is on Jardiance and glipizide and metformin and Victoza.  Patient states that his blood sugar has been elevated around low 200s.  He states that he feels dizzy all the time.  He felt that it was side effect that his medicines. He denies any trouble speaking.  Denies any episodes of syncope.  [facility] and apparently was supposed to get his medications mailed to him.  Patient states that he is still taking his medicines and wants to talk to a diabetic educator.  The history is provided by the patient.      Home Medications Prior to Admission medications   Medication Sig Start Date End Date Taking? Authorizing Provider  Accu-Chek Softclix Lancets lancets Test blood sugar 2 times daily 03/22/21   Mayers, Cari S, PA-C  amLODipine (NORVASC) 10 MG tablet Take 1 tablet (10 mg total) by mouth daily. 03/22/21   Mayers, Cari S, PA-C  atorvastatin (LIPITOR) 40 MG tablet Take 1 tablet (40 mg total) by mouth daily. 03/22/21   Mayers, Cari S, PA-C  Blood Glucose Monitoring Suppl (ACCU-CHEK GUIDE) w/Device KIT USE AS DIRECTED TO CHECK BLOOD SUGARS UP TO TWICE PER DAY Patient taking differently: 1 each by Other route 2 (two) times daily. 02/17/19   [name], MD  Blood Glucose Monitoring Suppl (ACCU-CHEK GUIDE) w/Device KIT Use as directed Patient taking differently: 1 each by Other route 2 (two) times daily. 11/03/20   Elsie Stain, MD  cyclobenzaprine (FLEXERIL) 10 MG tablet Take 1 tablet (10 mg total) by mouth 2 (two) times daily  as needed for muscle spasms. 12/26/20   [name], MD  DULoxetine (CYMBALTA) 60 MG capsule Take 1 capsule (60 mg total) by mouth daily. 03/22/21   Mayers, Cari S, PA-C  empagliflozin (JARDIANCE) 10 MG TABS tablet Take 1 tablet (10 mg total) by mouth daily before breakfast. 03/22/21   Mayers, Cari S, PA-C  gabapentin (NEURONTIN) 300 MG capsule Take 2 capsules (600 mg total) by mouth 3 (three) times daily. 03/22/21   Mayers, Cari S, PA-C  glipiZIDE (GLUCOTROL XL) 10 MG 24 hr tablet Take 1 tablet (10 mg total) by mouth daily with breakfast. 03/22/21   Mayers, Cari S, PA-C  glucose blood test strip Test twice daily 03/22/21   Mayers, Cari S, PA-C  Insulin Pen Needle 32G X 4 MM MISC Use with Victoza 03/22/21   Mayers, Cari S, PA-C  liraglutide (VICTOZA) 18 MG/3ML SOPN Inject 0.6 mg once daily 03/22/21   Mayers, Cari S, PA-C  metFORMIN (GLUCOPHAGE) 1000 MG tablet Take 1 tablet (1,000 mg total) by mouth 2 (two) times daily. 03/22/21   Mayers, Cari S, PA-C  ondansetron (ZOFRAN) 4 MG tablet Take 1 tablet (4 mg total) by mouth every 6 (six) hours. 03/22/21   Mayers, Cari S, PA-C  loratadine (CLARITIN) 10 MG tablet Take 1 tablet (10 mg total) by mouth daily. Patient not taking: No sig reported 06/03/17 07/20/20  [name], MD      Allergies  Lisinopril    Review of Systems   Review of Systems  Neurological:  Positive for dizziness.  All other systems reviewed and are negative.  Physical Exam Updated Vital Signs BP 127/85 (BP Location: Left Arm)    Pulse 77    Temp 97.9 F (36.6 C) (Oral)    Resp 17    SpO2 97%  Physical Exam Vitals and nursing note reviewed.  Constitutional:      Appearance: Normal appearance.  HENT:     Head: Normocephalic.     Nose: Nose normal.     Mouth/Throat:     Mouth: Mucous membranes are moist.  Eyes:     Extraocular Movements: Extraocular movements intact.     Pupils: Pupils are equal, round, and reactive to light.  Cardiovascular:     Rate and Rhythm:  Normal rate and regular rhythm.     Pulses: Normal pulses.     Heart sounds: Normal heart sounds.  Pulmonary:     Effort: Pulmonary effort is normal.     Breath sounds: Normal breath sounds.  Abdominal:     General: Abdomen is flat.     Palpations: Abdomen is soft.  Musculoskeletal:        General: Normal range of motion.     Cervical back: Normal range of motion and neck supple.  Skin:    General: Skin is warm.     Capillary Refill: Capillary refill takes less than 2 seconds.  Neurological:     General: No focal deficit present.     Mental Status: He is alert and oriented to person, place, and time.     Comments: Patient has no obvious facial droop.  Patient has normal finger-to-nose bilaterally.  Normal strength and sensation bilaterally.  Patient has normal gait.  No pronator drift and negative Romberg sign  Psychiatric:        Mood and Affect: Mood normal.        Behavior: Behavior normal.    ED Results / Procedures / Treatments   Labs (all labs ordered are listed, but only abnormal results are displayed) Labs Reviewed  BASIC METABOLIC PANEL - Abnormal; Notable for the following components:      Result Value   CO2 21 (*)    Glucose, Bld 240 (*)    All other components within normal limits  CBG MONITORING, ED - Abnormal; Notable for the following components:   Glucose-Capillary 201 (*)    All other components within normal limits  CBG MONITORING, ED - Abnormal; Notable for the following components:   Glucose-Capillary 200 (*)    All other components within normal limits  CBC  URINALYSIS, ROUTINE W REFLEX MICROSCOPIC    EKG None  Radiology No results found.  Procedures Procedures    Medications Ordered in ED Medications - No data to display  ED Course/ Medical Decision Making/ A&P  MDM Number of Diagnoses or Management Options Diagnosis management comments: David Wyatt is a 61 y.o. male here with hyperglycemia.  Patient is already on appropriate meds for  hyperglycemia.  However he wants to talk to a diabetic educator.  Patient had labs drawn in the ED.  His glucose is 240 and his anion gap is normal.  Patient is very anxious about his meds.  I had case management go over his medications with him and make sure his medicines gets delivered.     Amount and/or Complexity of Data Reviewed Clinical lab tests: ordered and reviewed Tests in the medicine section  of CPT: ordered and reviewed Decide to obtain previous medical records or to obtain history from someone other than the patient: yes Review and summarize past medical records: yes  Risk of Complications, Morbidity, and/or Mortality Presenting problems: moderate Management options: low  Patient Progress Patient progress: improved                           Final Clinical Impression(s) / ED Diagnoses Final diagnoses:  None    Rx / DC Orders ED Discharge Orders     None         Drenda Freeze, MD 03/27/21 1631

## 2021-03-27 NOTE — Telephone Encounter (Signed)
°  Chief Complaint: elevated glucose Symptoms: dizziness, blurred vision Frequency: on/off Pertinent Negatives: Patient denies fever, vomiting, difficulty breathing Disposition: [] ED /[] Urgent Care (no appt availability in office) / [] Appointment(In office/virtual)/ []  [facility]/ [] Home Care/ [] Refused Recommended Disposition /[]  Mobile Bus/ []  Follow-up with PCP Additional Notes: Patient states he can not go to appointment anywhere- no transportation. Call to office -they will call patient and set up appointment and transportation. Patient advised 911 for increased symptoms

## 2021-03-27 NOTE — Telephone Encounter (Signed)
Summary: blood sugar   The patient would like to speak with a member of staff about their blood sugar   It is currently fluctuating between 218 and 280   Please contact further when available     See previous call- advised ED- patient very anxious and can not get level to decrease with medications.

## 2021-03-27 NOTE — Telephone Encounter (Signed)
Pt is needing transportation to pick up medications.

## 2021-03-27 NOTE — Discharge Planning (Signed)
RNCM received call from [facility] Case Manager regarding pt Rx ready for pickup.  Second shift RNCM will pick up and bring in Rx when she reports for work today.  RX will be delivered to pt at bedside prior to discharge home today.

## 2021-03-27 NOTE — Telephone Encounter (Signed)
Call placed to patient and VM was left informing patient to return phone call. 

## 2021-03-27 NOTE — Telephone Encounter (Signed)
Call returned to patient # [phone], message left with call back requested to this CM.  Per Georgiann Mohs, [place], patient has jardiance and farxiga ready for pick up.  They needed to obtain a prior auth. If patient is not able to pick them up, they can be mailed to him.

## 2021-03-27 NOTE — Telephone Encounter (Signed)
Call placed to patient and a VM was left informing patient to return phone call.

## 2021-03-27 NOTE — ED Provider Triage Note (Signed)
Emergency Medicine Provider Triage Evaluation Note  David Wyatt , a 61 y.o. male  was evaluated in triage.  Pt complains of hyperglycemia for several months.  Patient states that he is normally on metformin, glipizide, and "a shot" for his diabetes.  He states he has not changed his routine with his medications, but his blood sugar continues to be elevated up into the 500s.  He also states he has had some right-sided flank pain from a kidney stone that he has had for about a month.  Review of Systems  Positive: Hyperglycemia, dizziness, nausea, flank pain Negative: Fever, chills, vomiting  Physical Exam  BP 125/85 (BP Location: Left Arm)    Pulse 86    Temp 97.7 F (36.5 C) (Oral)    Resp 14    SpO2 100%  Gen:   Awake, no distress   Resp:  Normal effort  MSK:   Moves extremities without difficulty  Other:    Medical Decision Making  Medically screening exam initiated at 1:12 PM.  Appropriate orders placed.  [name] was informed that the remainder of the evaluation will be completed by another provider, this initial triage assessment does not replace that evaluation, and the importance of remaining in the ED until their evaluation is complete.     Nicolle Heward T, PA-C 03/27/21 1313

## 2021-03-27 NOTE — Discharge Instructions (Addendum)
Our case manager has seen you to verify that you get your medications delivered.  You will need to see a diabetic educator at the wellness clinic  Follow-up with your doctor at the wellness clinic  Return to ER if you have worse dizziness, glucose more than 600 or less than 60, vomiting, passing out

## 2021-03-27 NOTE — Telephone Encounter (Signed)
Reason for Disposition  [1] MODERATE dizziness (e.g., interferes with normal activities) AND [2] has NOT been evaluated by physician for this  (Exception: dizziness caused by heat exposure, sudden standing, or poor fluid intake)  [1] Caller has URGENT medication or insulin pump question AND [2] triager unable to answer question  Answer Assessment - Initial Assessment Questions 1. DESCRIPTION: "Describe your dizziness."     Off balance, blurred vision- patient thinks medication related 2. LIGHTHEADED: "Do you feel lightheaded?" (e.g., somewhat faint, woozy, weak upon standing)     Sometimes- patient has just started taking glucose medication- fasting has been over 200 fasting can be over 300 3. VERTIGO: "Do you feel like either you or the room is spinning or tilting?" (i.e. vertigo)     no 4. SEVERITY: "How bad is it?"  "Do you feel like you are going to faint?" "Can you stand and walk?"   - MILD: Feels slightly dizzy, but walking normally.   - MODERATE: Feels unsteady when walking, but not falling; interferes with normal activities (e.g., school, work).   - SEVERE: Unable to walk without falling, or requires assistance to walk without falling; feels like passing out now.      moderate 5. ONSET:  "When did the dizziness begin?"     When started the medication 6. AGGRAVATING FACTORS: "Does anything make it worse?" (e.g., standing, change in head position)     *No Answer* 7. HEART RATE: "Can you tell me your heart rate?" "How many beats in 15 seconds?"  (Note: not all patients can do this)       *No Answer* 8. CAUSE: "What do you think is causing the dizziness?"     *No Answer* 9. RECURRENT SYMPTOM: "Have you had dizziness before?" If Yes, ask: "When was the last time?" "What happened that time?"     *No Answer* 10. OTHER SYMPTOMS: "Do you have any other symptoms?" (e.g., fever, chest pain, vomiting, diarrhea, bleeding)       *No Answer* 11. PREGNANCY: "Is there any chance you are pregnant?"  "When was your last menstrual period?"       *No Answer*  Answer Assessment - Initial Assessment Questions 1. BLOOD GLUCOSE: "What is your blood glucose level?"      278 2. ONSET: "When did you check the blood glucose?"     8:27 3. USUAL RANGE: "What is your glucose level usually?" (e.g., usual fasting morning value, usual evening value)     200-300- just started back on medications 4. KETONES: "Do you check for ketones (urine or blood test strips)?" If yes, ask: "What does the test show now?"      no 5. TYPE 1 or 2:  "Do you know what type of diabetes you have?"  (e.g., Type 1, Type 2, Gestational; doesn't know)      Type 2 6. INSULIN: "Do you take insulin?" "What type of insulin(s) do you use? What is the mode of delivery? (syringe, pen; injection or pump)?"      Yes-victoza 7. DIABETES PILLS: "Do you take any pills for your diabetes?" If yes, ask: "Have you missed taking any pills recently?"     Yes- metformin, glipizide 8. OTHER SYMPTOMS: "Do you have any symptoms?" (e.g., fever, frequent urination, difficulty breathing, dizziness, weakness, vomiting)     Dizziness, blurred vision at times 9. PREGNANCY: "Is there any chance you are pregnant?" "When was your last menstrual period?"     *No Answer*  Protocols used: Dizziness - Lightheadedness-A-AH, Diabetes -  High Blood Sugar-A-AH

## 2021-03-27 NOTE — Telephone Encounter (Signed)
Informed Fuller Mandril, RN CM that Per Georgiann Mohs, [facility], the 2 medications that patient needs have already been mailed to him and he should be receiving them tomorrow.

## 2021-03-30 ENCOUNTER — Other Ambulatory Visit (HOSPITAL_COMMUNITY): Payer: Self-pay

## 2021-04-02 ENCOUNTER — Other Ambulatory Visit: Payer: Self-pay

## 2021-04-03 ENCOUNTER — Other Ambulatory Visit: Payer: Self-pay

## 2021-04-11 ENCOUNTER — Encounter (HOSPITAL_COMMUNITY): Payer: Self-pay

## 2021-04-11 ENCOUNTER — Emergency Department (HOSPITAL_COMMUNITY)
Admission: EM | Admit: 2021-04-11 | Discharge: 2021-04-12 | Disposition: A | Discharge status: Home / Self Care | Payer: Medicaid Other | Attending: Emergency Medicine | Admitting: Emergency Medicine

## 2021-04-11 DIAGNOSIS — R1084 Generalized abdominal pain: Secondary | ICD-10-CM | POA: Insufficient documentation

## 2021-04-11 DIAGNOSIS — Z794 Long term (current) use of insulin: Secondary | ICD-10-CM | POA: Insufficient documentation

## 2021-04-11 DIAGNOSIS — Z7984 Long term (current) use of oral hypoglycemic drugs: Secondary | ICD-10-CM | POA: Insufficient documentation

## 2021-04-11 DIAGNOSIS — E119 Type 2 diabetes mellitus without complications: Secondary | ICD-10-CM | POA: Insufficient documentation

## 2021-04-11 DIAGNOSIS — R112 Nausea with vomiting, unspecified: Secondary | ICD-10-CM | POA: Diagnosis not present

## 2021-04-11 DIAGNOSIS — I10 Essential (primary) hypertension: Secondary | ICD-10-CM | POA: Diagnosis not present

## 2021-04-11 DIAGNOSIS — Z79899 Other long term (current) drug therapy: Secondary | ICD-10-CM | POA: Diagnosis not present

## 2021-04-11 DIAGNOSIS — R1110 Vomiting, unspecified: Secondary | ICD-10-CM | POA: Insufficient documentation

## 2021-04-11 DIAGNOSIS — K529 Noninfective gastroenteritis and colitis, unspecified: Secondary | ICD-10-CM | POA: Insufficient documentation

## 2021-04-11 DIAGNOSIS — Z5321 Procedure and treatment not carried out due to patient leaving prior to being seen by health care provider: Secondary | ICD-10-CM | POA: Insufficient documentation

## 2021-04-11 DIAGNOSIS — R197 Diarrhea, unspecified: Secondary | ICD-10-CM | POA: Insufficient documentation

## 2021-04-11 LAB — COMPREHENSIVE METABOLIC PANEL
ALT: 16 U/L (ref 0–44)
AST: 20 U/L (ref 15–41)
Albumin: 4 g/dL (ref 3.5–5.0)
Alkaline Phosphatase: 78 U/L (ref 38–126)
Anion gap: 12 (ref 5–15)
BUN: 28 mg/dL — ABNORMAL HIGH (ref 8–23)
CO2: 24 mmol/L (ref 22–32)
Calcium: 9.3 mg/dL (ref 8.9–10.3)
Chloride: 100 mmol/L (ref 98–111)
Creatinine, Ser: 1.54 mg/dL — ABNORMAL HIGH (ref 0.61–1.24)
GFR, Estimated: 51 mL/min — ABNORMAL LOW (ref >60)
Glucose, Bld: 303 mg/dL — ABNORMAL HIGH (ref 70–99)
Potassium: 4.1 mmol/L (ref 3.5–5.1)
Sodium: 136 mmol/L (ref 135–145)
Total Bilirubin: 0.9 mg/dL (ref 0.3–1.2)
Total Protein: 7.9 g/dL (ref 6.5–8.1)

## 2021-04-11 LAB — CBC WITH DIFFERENTIAL/PLATELET
Abs Immature Granulocytes: 0.23 10*3/uL — ABNORMAL HIGH (ref 0.00–0.07)
Basophils Absolute: 0.1 10*3/uL (ref 0.0–0.1)
Basophils Relative: 0 %
Eosinophils Absolute: 0.2 10*3/uL (ref 0.0–0.5)
Eosinophils Relative: 1 %
HCT: 51.9 % (ref 39.0–52.0)
Hemoglobin: 17.1 g/dL — ABNORMAL HIGH (ref 13.0–17.0)
Immature Granulocytes: 1 %
Lymphocytes Relative: 10 %
Lymphs Abs: 2 10*3/uL (ref 0.7–4.0)
MCH: 31.4 pg (ref 26.0–34.0)
MCHC: 32.9 g/dL (ref 30.0–36.0)
MCV: 95.2 fL (ref 80.0–100.0)
Monocytes Absolute: 0.8 10*3/uL (ref 0.1–1.0)
Monocytes Relative: 4 %
Neutro Abs: 17.2 10*3/uL — ABNORMAL HIGH (ref 1.7–7.7)
Neutrophils Relative %: 84 %
Platelets: 371 10*3/uL (ref 150–400)
RBC: 5.45 MIL/uL (ref 4.22–5.81)
RDW: 13.1 % (ref 11.5–15.5)
WBC: 20.4 10*3/uL — ABNORMAL HIGH (ref 4.0–10.5)
nRBC: 0 % (ref 0.0–0.2)

## 2021-04-11 LAB — LIPASE, BLOOD: Lipase: 39 U/L (ref 11–51)

## 2021-04-11 NOTE — ED Provider Triage Note (Signed)
Emergency Medicine Provider Triage Evaluation Note  David Wyatt , a 61 y.o. male  was evaluated in triage.  Pt complains of here for adb pain, Generalized. Has had previously. Not taking home meds. Admits to crack cocaine use, last use last night. Some NBNB emesis and 1 episode of diarrhea without melena or BRBPR. No HA, falls, CP, SOB, LE edema  Review of Systems  Positive: Abd pain Negative: Fever, cough, CP, SOB  Physical Exam  There were no vitals taken for this visit. Gen:   Awake, no distress   Resp:  Normal effort  ABD:  Generalized tenderness. No focal pain MSK:   Moves extremities without difficulty  Other:    Medical Decision Making  Medically screening exam initiated at 3:24 PM.  Appropriate orders placed.  David Wyatt was informed that the remainder of the evaluation will be completed by another provider, this initial triage assessment does not replace that evaluation, and the importance of remaining in the ED until their evaluation is complete.  Abd pain, N/V/D   Sherrise Liberto A, PA-C 04/11/21 1524

## 2021-04-11 NOTE — ED Triage Notes (Signed)
Pt arrived via EMS, from home, diffuse abd pain.

## 2021-04-11 NOTE — ED Notes (Signed)
I attempted to collect labs and was unsuccessful patient kept moving

## 2021-04-12 ENCOUNTER — Emergency Department (HOSPITAL_COMMUNITY): Payer: Medicaid Other

## 2021-04-12 ENCOUNTER — Encounter (HOSPITAL_COMMUNITY): Payer: Self-pay

## 2021-04-12 ENCOUNTER — Emergency Department (HOSPITAL_COMMUNITY)
Admission: EM | Admit: 2021-04-12 | Discharge: 2021-04-12 | Disposition: A | Discharge status: Home / Self Care | Payer: Medicaid Other | Source: Home / Self Care | Attending: Emergency Medicine | Admitting: Emergency Medicine

## 2021-04-12 ENCOUNTER — Ambulatory Visit (HOSPITAL_COMMUNITY): Admission: RE | Admit: 2021-04-12 | Payer: Medicaid Other | Source: Ambulatory Visit

## 2021-04-12 DIAGNOSIS — K529 Noninfective gastroenteritis and colitis, unspecified: Secondary | ICD-10-CM

## 2021-04-12 DIAGNOSIS — E119 Type 2 diabetes mellitus without complications: Secondary | ICD-10-CM | POA: Insufficient documentation

## 2021-04-12 DIAGNOSIS — I10 Essential (primary) hypertension: Secondary | ICD-10-CM | POA: Insufficient documentation

## 2021-04-12 DIAGNOSIS — Z7984 Long term (current) use of oral hypoglycemic drugs: Secondary | ICD-10-CM | POA: Insufficient documentation

## 2021-04-12 DIAGNOSIS — Z79899 Other long term (current) drug therapy: Secondary | ICD-10-CM | POA: Insufficient documentation

## 2021-04-12 DIAGNOSIS — R112 Nausea with vomiting, unspecified: Secondary | ICD-10-CM | POA: Insufficient documentation

## 2021-04-12 DIAGNOSIS — Z794 Long term (current) use of insulin: Secondary | ICD-10-CM | POA: Insufficient documentation

## 2021-04-12 LAB — CBC WITH DIFFERENTIAL/PLATELET
Abs Immature Granulocytes: 0.05 10*3/uL (ref 0.00–0.07)
Basophils Absolute: 0.1 10*3/uL (ref 0.0–0.1)
Basophils Relative: 1 %
Eosinophils Absolute: 0.1 10*3/uL (ref 0.0–0.5)
Eosinophils Relative: 1 %
HCT: 41.2 % (ref 39.0–52.0)
Hemoglobin: 13.8 g/dL (ref 13.0–17.0)
Immature Granulocytes: 1 %
Lymphocytes Relative: 20 %
Lymphs Abs: 2.2 10*3/uL (ref 0.7–4.0)
MCH: 31.4 pg (ref 26.0–34.0)
MCHC: 33.5 g/dL (ref 30.0–36.0)
MCV: 93.6 fL (ref 80.0–100.0)
Monocytes Absolute: 0.6 10*3/uL (ref 0.1–1.0)
Monocytes Relative: 5 %
Neutro Abs: 8.1 10*3/uL — ABNORMAL HIGH (ref 1.7–7.7)
Neutrophils Relative %: 72 %
Platelets: 337 10*3/uL (ref 150–400)
RBC: 4.4 MIL/uL (ref 4.22–5.81)
RDW: 13.1 % (ref 11.5–15.5)
WBC: 11.1 10*3/uL — ABNORMAL HIGH (ref 4.0–10.5)
nRBC: 0 % (ref 0.0–0.2)

## 2021-04-12 LAB — LIPASE, BLOOD: Lipase: 44 U/L (ref 11–51)

## 2021-04-12 LAB — CBC
HCT: 39.3 % (ref 39.0–52.0)
Hemoglobin: 13.2 g/dL (ref 13.0–17.0)
MCH: 31.6 pg (ref 26.0–34.0)
MCHC: 33.6 g/dL (ref 30.0–36.0)
MCV: 94 fL (ref 80.0–100.0)
Platelets: 304 10*3/uL (ref 150–400)
RBC: 4.18 MIL/uL — ABNORMAL LOW (ref 4.22–5.81)
RDW: 13.2 % (ref 11.5–15.5)
WBC: 11 10*3/uL — ABNORMAL HIGH (ref 4.0–10.5)
nRBC: 0 % (ref 0.0–0.2)

## 2021-04-12 LAB — COMPREHENSIVE METABOLIC PANEL
ALT: 16 U/L (ref 0–44)
AST: 17 U/L (ref 15–41)
Albumin: 3.8 g/dL (ref 3.5–5.0)
Alkaline Phosphatase: 72 U/L (ref 38–126)
Anion gap: 10 (ref 5–15)
BUN: 36 mg/dL — ABNORMAL HIGH (ref 8–23)
CO2: 22 mmol/L (ref 22–32)
Calcium: 8.9 mg/dL (ref 8.9–10.3)
Chloride: 97 mmol/L — ABNORMAL LOW (ref 98–111)
Creatinine, Ser: 1.59 mg/dL — ABNORMAL HIGH (ref 0.61–1.24)
GFR, Estimated: 49 mL/min — ABNORMAL LOW (ref >60)
Glucose, Bld: 342 mg/dL — ABNORMAL HIGH (ref 70–99)
Potassium: 4.8 mmol/L (ref 3.5–5.1)
Sodium: 129 mmol/L — ABNORMAL LOW (ref 135–145)
Total Bilirubin: 1 mg/dL (ref 0.3–1.2)
Total Protein: 7.4 g/dL (ref 6.5–8.1)

## 2021-04-12 LAB — LACTIC ACID, PLASMA: Lactic Acid, Venous: 1.2 mmol/L (ref 0.5–1.9)

## 2021-04-12 LAB — POC OCCULT BLOOD, ED: Fecal Occult Bld: POSITIVE — AB

## 2021-04-12 LAB — CK: Total CK: 97 U/L (ref 49–397)

## 2021-04-12 MED ORDER — SODIUM CHLORIDE 0.9 % IV BOLUS
1000.0000 mL | Freq: Once | INTRAVENOUS | Status: AC
  Administered 2021-04-12: 1000 mL via INTRAVENOUS

## 2021-04-12 MED ORDER — AMOXICILLIN-POT CLAVULANATE 875-125 MG PO TABS
1.0000 | ORAL_TABLET | Freq: Once | ORAL | Status: AC
  Administered 2021-04-12: 1 via ORAL
  Filled 2021-04-12: qty 1

## 2021-04-12 MED ORDER — IOHEXOL 350 MG/ML SOLN
80.0000 mL | Freq: Once | INTRAVENOUS | Status: AC | PRN
  Administered 2021-04-12: 80 mL via INTRAVENOUS

## 2021-04-12 MED ORDER — AMOXICILLIN-POT CLAVULANATE 875-125 MG PO TABS: 1.0000 | ORAL_TABLET | Freq: Two times a day (BID) | ORAL | 0 refills | Status: AC

## 2021-04-12 MED ORDER — SODIUM CHLORIDE (PF) 0.9 % IJ SOLN
INTRAMUSCULAR | Status: AC
  Filled 2021-04-12: qty 50

## 2021-04-12 MED ORDER — ONDANSETRON HCL 4 MG/2ML IJ SOLN
4.0000 mg | Freq: Once | INTRAMUSCULAR | Status: AC
  Administered 2021-04-12: 4 mg via INTRAVENOUS
  Filled 2021-04-12: qty 2

## 2021-04-12 MED ORDER — SODIUM CHLORIDE 0.9 % IV BOLUS
500.0000 mL | Freq: Once | INTRAVENOUS | Status: AC
  Administered 2021-04-12: 500 mL via INTRAVENOUS

## 2021-04-12 NOTE — ED Provider Notes (Signed)
[facility] DEPT Provider Note   CSN: [id number] Arrival date & time: 04/12/21  0541     History  Chief Complaint  Patient presents with   Blood In Stools   Abdominal Pain    David Wyatt is a 61 y.o. male.  The history is provided by the patient.  Abdominal Pain Pain location:  Generalized Pain quality: aching and cramping   Pain radiates to:  Does not radiate Pain severity:  Moderate Onset quality:  Gradual Duration:  1 day Timing:  Intermittent Progression:  Waxing and waning Chronicity:  New Context comment:  HTN, cocaine abuse, DM history, presents with abdominal cramping and blood in stool x 1. Has had n/v/d for 1 day. Admit to recent cociane use. No fever or chills. Checked in yesterday and left and came back after saw blood in stool. Relieved by:  Nothing Worsened by:  Nothing Associated symptoms: diarrhea, fatigue, hematochezia, nausea and vomiting   Associated symptoms: no anorexia, no belching, no chest pain, no chills, no constipation, no cough, no dysuria, no fever, no hematemesis, no hematuria, no melena, no shortness of breath and no sore throat       Home Medications Prior to Admission medications   Medication Sig Start Date End Date Taking? Authorizing Provider  amoxicillin-clavulanate (AUGMENTIN) 875-125 MG tablet Take 1 tablet by mouth 2 (two) times daily for 10 days. 04/12/21 04/22/21 Yes Hartlyn Reigel, DO  Accu-Chek Softclix Lancets lancets Test blood sugar 2 times daily 03/22/21   Mayers, Cari S, PA-C  amLODipine (NORVASC) 10 MG tablet Take 1 tablet (10 mg total) by mouth daily. 03/22/21   Mayers, Cari S, PA-C  atorvastatin (LIPITOR) 40 MG tablet Take 1 tablet (40 mg total) by mouth daily. 03/22/21   Mayers, Cari S, PA-C  Blood Glucose Monitoring Suppl (ACCU-CHEK GUIDE) w/Device KIT USE AS DIRECTED TO CHECK BLOOD SUGARS UP TO TWICE PER DAY Patient taking differently: 1 each by Other route 2 (two) times daily. 02/17/19    [name], MD  Blood Glucose Monitoring Suppl (ACCU-CHEK GUIDE) w/Device KIT Use as directed Patient taking differently: 1 each by Other route 2 (two) times daily. 11/03/20   Elsie Stain, MD  cyclobenzaprine (FLEXERIL) 10 MG tablet Take 1 tablet (10 mg total) by mouth 2 (two) times daily as needed for muscle spasms. 12/26/20   [name], MD  DULoxetine (CYMBALTA) 60 MG capsule Take 1 capsule (60 mg total) by mouth daily. 03/22/21   Mayers, Cari S, PA-C  empagliflozin (JARDIANCE) 10 MG TABS tablet Take 1 tablet (10 mg total) by mouth daily before breakfast. 03/22/21   Mayers, Cari S, PA-C  gabapentin (NEURONTIN) 300 MG capsule Take 2 capsules (600 mg total) by mouth 3 (three) times daily. 03/22/21   Mayers, Cari S, PA-C  glipiZIDE (GLUCOTROL XL) 10 MG 24 hr tablet Take 1 tablet (10 mg total) by mouth daily with breakfast. 03/22/21   Mayers, Cari S, PA-C  glucose blood test strip Test twice daily 03/22/21   Mayers, Cari S, PA-C  Insulin Pen Needle 32G X 4 MM MISC Use with Victoza 03/22/21   Mayers, Cari S, PA-C  liraglutide (VICTOZA) 18 MG/3ML SOPN Inject 0.6 mg once daily 03/22/21   Mayers, Cari S, PA-C  metFORMIN (GLUCOPHAGE) 1000 MG tablet Take 1 tablet (1,000 mg total) by mouth 2 (two) times daily. 03/22/21   Mayers, Cari S, PA-C  ondansetron (ZOFRAN) 4 MG tablet Take 1 tablet (4 mg total) by mouth every 6 (six)  hours. 03/22/21   Mayers, Cari S, PA-C  loratadine (CLARITIN) 10 MG tablet Take 1 tablet (10 mg total) by mouth daily. Patient not taking: No sig reported 06/03/17 07/20/20  [name], MD      Allergies    Lisinopril    Review of Systems   Review of Systems  Constitutional:  Positive for fatigue. Negative for chills and fever.  HENT:  Negative for sore throat.   Respiratory:  Negative for cough and shortness of breath.   Cardiovascular:  Negative for chest pain.  Gastrointestinal:  Positive for abdominal pain, diarrhea, hematochezia, nausea and vomiting. Negative for  anorexia, constipation, hematemesis and melena.  Genitourinary:  Negative for dysuria and hematuria.   Physical Exam Updated Vital Signs BP (!) 165/95    Pulse 69    Temp 98.8 F (37.1 C) (Oral)    Resp 18    SpO2 95%  Physical Exam Vitals and nursing note reviewed.  Constitutional:      General: He is not in acute distress.    Appearance: He is well-developed.  HENT:     Head: Normocephalic and atraumatic.     Nose: Nose normal.     Mouth/Throat:     Mouth: Mucous membranes are dry.  Eyes:     Extraocular Movements: Extraocular movements intact.     Conjunctiva/sclera: Conjunctivae normal.     Pupils: Pupils are equal, round, and reactive to light.  Cardiovascular:     Rate and Rhythm: Normal rate and regular rhythm.     Pulses: Normal pulses.     Heart sounds: Normal heart sounds. No murmur heard. Pulmonary:     Effort: Pulmonary effort is normal. No respiratory distress.     Breath sounds: Normal breath sounds.  Abdominal:     Palpations: Abdomen is soft.     Tenderness: There is abdominal tenderness (diffuse).  Genitourinary:    Rectum: Guaiac result positive (brown stool intermixed with some trace red blood).  Musculoskeletal:        General: No swelling.     Cervical back: Neck supple.  Skin:    General: Skin is warm and dry.     Capillary Refill: Capillary refill takes less than 2 seconds.  Neurological:     General: No focal deficit present.     Mental Status: He is alert.  Psychiatric:        Mood and Affect: Mood normal.    ED Results / Procedures / Treatments   Labs (all labs ordered are listed, but only abnormal results are displayed) Labs Reviewed  CBC WITH DIFFERENTIAL/PLATELET - Abnormal; Notable for the following components:      Result Value   WBC 11.1 (*)    Neutro Abs 8.1 (*)    All other components within normal limits  COMPREHENSIVE METABOLIC PANEL - Abnormal; Notable for the following components:   Sodium 129 (*)    Chloride 97 (*)     Glucose, Bld 342 (*)    BUN 36 (*)    Creatinine, Ser 1.59 (*)    GFR, Estimated 49 (*)    All other components within normal limits  CBC - Abnormal; Notable for the following components:   WBC 11.0 (*)    RBC 4.18 (*)    All other components within normal limits  POC OCCULT BLOOD, ED - Abnormal; Notable for the following components:   Fecal Occult Bld POSITIVE (*)    All other components within normal limits  LIPASE, BLOOD  LACTIC ACID, PLASMA  CK    EKG None  Radiology CT Angio Abd/Pel W and/or Wo Contrast  Result Date: 04/12/2021 CLINICAL DATA:  pain, colitis, blood in stool EXAM: CTA ABDOMEN AND PELVIS WITHOUT AND WITH CONTRAST TECHNIQUE: Multidetector CT imaging of the abdomen and pelvis was performed using the standard protocol during bolus administration of intravenous contrast. Multiplanar reconstructed images and MIPs were obtained and reviewed to evaluate the vascular anatomy. RADIATION DOSE REDUCTION: This exam was performed according to the departmental dose-optimization program which includes automated exposure control, adjustment of the mA and/or kV according to patient size and/or use of iterative reconstruction technique. CONTRAST:  45m OMNIPAQUE IOHEXOL 350 MG/ML SOLN COMPARISON:  Multiple priors FINDINGS: VASCULAR Aorta: Normal caliber aorta without aneurysm, dissection, vasculitis or significant stenosis. Celiac: Patent without evidence of aneurysm, dissection, vasculitis or significant stenosis. SMA: Patent without evidence of aneurysm, dissection, vasculitis or significant stenosis. IMA: Patent. Renals: Both renal arteries are patent without evidence of aneurysm, dissection, vasculitis, fibromuscular dysplasia or significant stenosis. Inflow: Patent without evidence of aneurysm or dissection. Note is made of a tortuous course of the left internal iliac artery with proximal calcifications resulting in at least moderate stenosis. Veins: No obvious venous abnormality within  the limitations of this arterial phase study. NON-VASCULAR Inferior chest: Subsegmental atelectasis in the dependent lung bases. Hepatobiliary: The liver is normal in size without focal abnormality. No intrahepatic or extrahepatic biliary ductal dilation. The gallbladder appears normal. Spleen: Normal in size without focal abnormality. Pancreas: No pancreatic ductal dilatation or surrounding inflammatory changes. Adrenals/Urinary Tract: Adrenal glands are unremarkable. The kidneys are normal in size. No focal lesion. Bilateral punctate nonobstructive nephrolithiasis. A right ureteral stent is in appropriate position. Bladder is unremarkable. Stomach/Bowel: The sigmoid wall appears thickened and edematous with mucosal hyperenhancement on arterial phase imaging. No findings of active arterial extravasation. The small and large bowel are normal in caliber without obstruction. No pneumoperitoneum. Reproductive: Prostate is unremarkable. Lymphatic: No enlarged lymph nodes in the abdomen or pelvis. Other: No abdominopelvic ascites. Musculoskeletal: No aggressive osseous lesions. L4-L5 posterior lumbar fusion. The soft tissues are unremarkable. IMPRESSION: VASCULAR 1. No findings of active arterial gastrointestinal hemorrhage. NON-VASCULAR 1. Inflammatory changes of the sigmoid colon compatible with colitis. 2. Bilateral nonobstructive nephrolithiasis with right ureteral stent in place. Electronically Signed   By: YAlbin FellingM.D.   On: 04/12/2021 09:41    Procedures Procedures    Medications Ordered in ED Medications  sodium chloride 0.9 % bolus 1,000 mL (1,000 mLs Intravenous New Bag/Given 04/12/21 0825)  ondansetron (ZOFRAN) injection 4 mg (4 mg Intravenous Given 04/12/21 0826)  sodium chloride (PF) 0.9 % injection (  Given by Other 04/12/21 0904)  iohexol (OMNIPAQUE) 350 MG/ML injection 80 mL (80 mLs Intravenous Contrast Given 04/12/21 0842)  amoxicillin-clavulanate (AUGMENTIN) 875-125 MG per tablet 1 tablet (1  tablet Oral Given 04/12/21 1020)  sodium chloride 0.9 % bolus 500 mL (500 mLs Intravenous New Bag/Given 04/12/21 1019)    ED Course/ Medical Decision Making/ A&P                           Medical Decision Making Amount and/or Complexity of Data Reviewed Labs: ordered. Radiology: ordered.  Risk Prescription drug management.   David Wyatt a 61year old male with history of hypertension, diabetes, high cholesterol, cocaine abuse who presents to the ED with abdominal pain, red blood in stool.  Patient with unremarkable vitals.  No fever.  States he  was seen here yesterday but left without being fully evaluated.  He came in with abdominal cramping, nausea, vomiting, diarrhea.  He left after having lab work drawn in triage where he had labs significant for white count of 20 upon my review.  Creatinine was mildly elevated at 1.5.  Otherwise lab work was unremarkable.  He states he came back after having an episode of some blood in his stool.  He continues to have lower abdominal cramping with nausea, vomiting, diarrhea.  He does admit to recent cocaine abuse.  Has not had much to eat or drink and feels dehydrated.  Overall his abdominal exam has tenderness throughout but no obvious peritonitis.  His rectal exam shows no obvious hemorrhoids but there is some dry red blood intermixed with brown stool.  Does not admit to any large-volume blood.  No blood clots.  Had 1 episode of blood in his stool around midnight.  Differential diagnosis includes colitis versus viral gastroenteritis versus electrolyte abnormality/dehydration versus hemorrhoid.  Given history and physical have a lower concern for diverticular bleeding or significant GI bleed but will further evaluate.  Patient not on blood thinners.  Will pursue work-up with CBC, CMP, lipase, lactic acid, CT scan abdomen pelvis.  Labs have been reviewed and per my interpretation there is no significant leukocytosis.  White count down to 11.  Hemoglobin is  14.  Suspect some hemoconcentration yesterday with a hemoglobin above his baseline at 17.  14 appears to be his baseline.  We will check a additional CBC but have a lower suspicion for GI bleed.  Creatinine is stable at 1.5.  No evidence of DKA.  Lactic acid is normal.  CK is normal.  CT angio of the abdomen and pelvis shows per radiology read inflammatory changes of the sigmoid colon compatible with colitis.  There is no active bleeding.  He is not having any large amount of rectal bleeding not passing clots.  My suspicion is that this is inflammatory process but will treat with Augmentin.  He is feeling better after IV fluids.  We will recheck CBC anticipate discharge with Augmentin.  Repeat CBC is stable.  He is feeling much better.  We will discharge him with Augmentin and GI follow-up.  He understands return to the ED if he has large amount of rectal bleeding or other concerning symptoms.  This chart was dictated using voice recognition software.  Despite best efforts to proofread,  errors can occur which can change the documentation meaning.         Final Clinical Impression(s) / ED Diagnoses Final diagnoses:  Colitis    Rx / DC Orders ED Discharge Orders          Ordered    amoxicillin-clavulanate (AUGMENTIN) 875-125 MG tablet  2 times daily        04/12/21 1048              Reia Viernes, DO 04/12/21 1100

## 2021-04-12 NOTE — Discharge Instructions (Addendum)
Overall there is some inflammation to your lower colon.  Take antibiotic as prescribed.  Please return if you have significant/heavy rectal bleeding as discussed.  Follow-up with gastroenterologist.

## 2021-04-12 NOTE — ED Triage Notes (Signed)
Patient states he has had abdominal pain over the last day, tonight began having bright red blood in his stool.

## 2021-04-12 NOTE — ED Notes (Signed)
Patient frustrated that he was informed that we would not get him an [employer], but instead a bus pass. Security was called to escort patient out.

## 2021-04-16 ENCOUNTER — Other Ambulatory Visit: Payer: Self-pay

## 2021-04-23 ENCOUNTER — Ambulatory Visit: Payer: Self-pay

## 2021-04-23 NOTE — Telephone Encounter (Signed)
[facility] agent was going to transfer pt. To triage and pt. Disconnected call. Left pt. Message to call back and discuss symptoms.

## 2021-04-30 ENCOUNTER — Other Ambulatory Visit: Payer: Self-pay

## 2021-05-15 ENCOUNTER — Other Ambulatory Visit: Payer: Self-pay

## 2021-05-15 ENCOUNTER — Encounter: Payer: Self-pay | Admitting: Family Medicine

## 2021-05-15 ENCOUNTER — Ambulatory Visit: Payer: Medicaid Other | Attending: Family Medicine | Admitting: Family Medicine

## 2021-05-15 ENCOUNTER — Ambulatory Visit: Payer: Self-pay | Admitting: *Deleted

## 2021-05-15 ENCOUNTER — Telehealth: Payer: Self-pay | Admitting: Family Medicine

## 2021-05-15 VITALS — BP 145/97 | HR 78 | Ht 68.0 in | Wt 143.0 lb

## 2021-05-15 DIAGNOSIS — M5442 Lumbago with sciatica, left side: Secondary | ICD-10-CM

## 2021-05-15 DIAGNOSIS — Z9114 Patient's other noncompliance with medication regimen: Secondary | ICD-10-CM

## 2021-05-15 DIAGNOSIS — Z5901 Sheltered homelessness: Secondary | ICD-10-CM | POA: Diagnosis not present

## 2021-05-15 DIAGNOSIS — Z91199 Patient's noncompliance with other medical treatment and regimen due to unspecified reason: Secondary | ICD-10-CM

## 2021-05-15 DIAGNOSIS — M7501 Adhesive capsulitis of right shoulder: Secondary | ICD-10-CM | POA: Diagnosis not present

## 2021-05-15 DIAGNOSIS — E1165 Type 2 diabetes mellitus with hyperglycemia: Secondary | ICD-10-CM

## 2021-05-15 DIAGNOSIS — E1159 Type 2 diabetes mellitus with other circulatory complications: Secondary | ICD-10-CM | POA: Diagnosis not present

## 2021-05-15 DIAGNOSIS — N2889 Other specified disorders of kidney and ureter: Secondary | ICD-10-CM

## 2021-05-15 DIAGNOSIS — I152 Hypertension secondary to endocrine disorders: Secondary | ICD-10-CM

## 2021-05-15 DIAGNOSIS — G8929 Other chronic pain: Secondary | ICD-10-CM

## 2021-05-15 DIAGNOSIS — R443 Hallucinations, unspecified: Secondary | ICD-10-CM

## 2021-05-15 DIAGNOSIS — Z5900 Homelessness unspecified: Secondary | ICD-10-CM

## 2021-05-15 DIAGNOSIS — D235 Other benign neoplasm of skin of trunk: Secondary | ICD-10-CM

## 2021-05-15 DIAGNOSIS — M5441 Lumbago with sciatica, right side: Secondary | ICD-10-CM

## 2021-05-15 LAB — POCT GLYCOSYLATED HEMOGLOBIN (HGB A1C): HbA1c POC (<> result, manual entry): >15 % (ref 4.0–5.6)

## 2021-05-15 LAB — GLUCOSE, POCT (MANUAL RESULT ENTRY)
POC Glucose: 462 mg/dl — AB (ref 70–99)
POC Glucose: 543 mg/dl — AB (ref 70–99)

## 2021-05-15 MED ORDER — INSULIN ASPART 100 UNIT/ML IJ SOLN
20.0000 [IU] | Freq: Once | INTRAMUSCULAR | Status: AC
  Administered 2021-05-15: 20 [IU] via SUBCUTANEOUS

## 2021-05-15 NOTE — Telephone Encounter (Signed)
?  Chief Complaint: back/leg pain ?Symptoms: severe ?Frequency: constant ?Pertinent Negatives: Patient denies fall/injury ?Disposition: '[]'$ ED /'[]'$ Urgent Care (no appt availability in office) / '[x]'$ Appointment(In office/virtual)/ '[]'$  [facility]/ '[]'$ Home Care/ '[]'$ Refused Recommended Disposition /'[]'$ [facility]/ '[]'$  Follow-up with PCP ?Additional Notes: Pt has appt today at 3:50 but does not have transportation. He states he was told [facility] was sending transportation and he never heard anything about it since. He is going to call back at 2 to see if transportation has been worked out. ?Kaitlyn, [facility] has arranged for transportation to pick up at 3pm. His appt is 3:50pm ? ?Reason for Disposition ? [1] SEVERE back pain (e.g., excruciating, unable to do any normal activities) AND [2] not improved 2 hours after pain medicine ? ?Answer Assessment - Initial Assessment Questions ?1. ONSET: "When did the pain begin?"  ?    A long time ago ?2. LOCATION: "Where does it hurt?" (upper, mid or lower back) ?    Back and legs ?3. SEVERITY: "How bad is the pain?"  (e.g., Scale 1-10; mild, moderate, or severe) ?  - MILD (1-3): doesn't interfere with normal activities  ?  - MODERATE (4-7): interferes with normal activities or awakens from sleep  ?  - SEVERE (8-10): excruciating pain, unable to do any normal activities  ?    10 ?4. PATTERN: "Is the pain constant?" (e.g., yes, no; constant, intermittent)  ?    constant ?5. RADIATION: "Does the pain shoot into your legs or elsewhere?" ?    Shoots down leg ?6. CAUSE:  "What do you think is causing the back pain?"  ?    unknown ?7. BACK OVERUSE:  "Any recent lifting of heavy objects, strenuous work or exercise?" ?    na ?8. MEDICATIONS: "What have you taken so far for the pain?" (e.g., nothing, acetaminophen, NSAIDS) ?    na ?9. NEUROLOGIC SYMPTOMS: "Do you have any weakness, numbness, or problems with bowel/bladder control?" ?    na ?10. OTHER SYMPTOMS: "Do you have any other  symptoms?" (e.g., fever, abdominal pain, burning with urination, blood in urine) ?      na ?11. PREGNANCY: "Is there any chance you are pregnant?" (e.g., yes, no; LMP) ?      na ? ?Protocols used: Back Pain-A-AH ? ?

## 2021-05-15 NOTE — Progress Notes (Signed)
States that after he takes his medication he feel weird and sees things. ? ?Stopped taking all medications. ?

## 2021-05-15 NOTE — Progress Notes (Addendum)
Subjective:  Patient ID: David Wyatt, male    DOB: [birth year]  Age: 61 y.o. MRN: [id number]  CC: Diabetes   HPI David Wyatt is a 61 y.o. year old male with a history of  type 2 diabetes mellitus (A1c >15),  Homelessness, diabetic neuropathy, hypertension, chronic low back pain (status post previous back surgery), medication nonadherence here for chronic disease management.  Interval History: He Complains of 'seeing things and seeing colors'. He was seeing things crawling on the floor. This led him to discontinue all his medications and then symptoms resolved per patient. His mouth is dry he states and his CBG is 462 in the clinic with A1c greater than 15.  Complains he is hungry and is asking for ginger ale and a cracker.  States he has no transportation to go to his appointments. Complains of a lesion on his neck which is getting bigger and he would like this excised.  His low back pain is getting worse and preventing him from seeping.  He now ambulates with a walker due to pain and has not been taking any of his prescribed medications-Cymbalta, Flexeril or gabapentin.   He is also loosing weight. Smokes 1 cig/week. 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 No involvement of the left renal collecting system or left renal vein. No adjacent pathologic adenopathy.   He was referred to urology but review of his chart indicates he declined referral due to lack of transportation. Past Medical History:  Diagnosis Date   Angioedema of lips 07/28/2012   left upper (07/29/2012)   Arthritis    hands and back   Chronic back pain    Cocaine abuse (HCC)    Diabetic neuropathy (HCC)    High cholesterol    Hypertension    Neuropathy    Type II diabetes mellitus ([place])     Past Surgical History:  Procedure Laterality Date    BACK SURGERY     CYSTOSCOPY W/ URETERAL STENT PLACEMENT Right 02/23/2021   Procedure: CYSTOSCOPY WITH RETROGRADE PYELOGRAM/URETERAL STENT PLACEMENT;  Surgeon: Janith Lima, MD;  Location: [facility];  Service: Urology;  Laterality: Right;   HERNIA REPAIR Right 04/01/2012   I & D EXTREMITY Left 12/13/2012   Procedure: IRRIGATION AND DEBRIDEMENT LEFT THUMB;  Surgeon: Tennis Must, MD;  Location: [facility];  Service: Orthopedics;  Laterality: Left;   INCISION AND DRAINAGE OF WOUND     boil on back/notes 07/14/2008 (07/29/2012)   INGUINAL HERNIA REPAIR  04/01/2012   Procedure: HERNIA REPAIR INGUINAL ADULT;  Surgeon: Haywood Lasso, MD;  Location: [facility];  Service: General;  Laterality: Right;   INGUINAL HERNIA REPAIR Left 09/06/2016   Procedure: OPEN REPAIR LEFT INGUINAL HERNIA;  Surgeon: Greer Pickerel, MD;  Location: [facility];  Service: General;  Laterality: Left;   INSERTION OF MESH Left 09/06/2016   Procedure: INSERTION OF MESH;  Surgeon: Greer Pickerel, MD;  Location: [facility];  Service: General;  Laterality: Left;   TONSILLECTOMY      Family History  Problem Relation Age of Onset   Diabetes Mother    Kidney disease Mother    Hyperlipidemia Mother    Hyperlipidemia Father    Diabetes Father     Allergies  Allergen Reactions   Lisinopril Other (See Comments)    Lip Swelling, angioedema    Outpatient Medications Prior  to Visit  Medication Sig Dispense Refill   Accu-Chek Softclix Lancets lancets Test blood sugar 2 times daily 100 each 6   amLODipine (NORVASC) 10 MG tablet Take 1 tablet (10 mg total) by mouth daily. 30 tablet 6   atorvastatin (LIPITOR) 40 MG tablet Take 1 tablet (40 mg total) by mouth daily. 30 tablet 6   Blood Glucose Monitoring Suppl (ACCU-CHEK GUIDE) w/Device KIT USE AS DIRECTED TO CHECK BLOOD SUGARS UP TO TWICE PER DAY (Patient taking differently: 1 each by Other route 2 (two) times daily.) 1 kit 0   Blood Glucose Monitoring Suppl (ACCU-CHEK GUIDE) w/Device KIT Use as directed  (Patient taking differently: 1 each by Other route 2 (two) times daily.) 1 kit 0   cyclobenzaprine (FLEXERIL) 10 MG tablet Take 1 tablet (10 mg total) by mouth 2 (two) times daily as needed for muscle spasms. 40 tablet 6   DULoxetine (CYMBALTA) 60 MG capsule Take 1 capsule (60 mg total) by mouth daily. 30 capsule 6   empagliflozin (JARDIANCE) 10 MG TABS tablet Take 1 tablet (10 mg total) by mouth daily before breakfast. 30 tablet 6   gabapentin (NEURONTIN) 300 MG capsule Take 2 capsules (600 mg total) by mouth 3 (three) times daily. 180 capsule 6   glipiZIDE (GLUCOTROL XL) 10 MG 24 hr tablet Take 1 tablet (10 mg total) by mouth daily with breakfast. 30 tablet 6   glucose blood test strip Test twice daily 100 each 2   Insulin Pen Needle 32G X 4 MM MISC Use with Victoza 100 each 0   liraglutide (VICTOZA) 18 MG/3ML SOPN Inject 0.6 mg once daily 36 mL 6   metFORMIN (GLUCOPHAGE) 1000 MG tablet Take 1 tablet (1,000 mg total) by mouth 2 (two) times daily. 60 tablet 6   ondansetron (ZOFRAN) 4 MG tablet Take 1 tablet (4 mg total) by mouth every 6 (six) hours. 12 tablet 0   Facility-Administered Medications Prior to Visit  Medication Dose Route Frequency Provider Last Rate Last Admin   insulin aspart (novoLOG) injection 20 Units  20 Units Subcutaneous Once Mayers, Cari S, PA-C         ROS Review of Systems  Constitutional:  Negative for activity change and appetite change.  HENT:  Negative for sinus pressure and sore throat.   Eyes:  Negative for visual disturbance.  Respiratory:  Negative for cough, chest tightness and shortness of breath.   Cardiovascular:  Negative for chest pain and leg swelling.  Gastrointestinal:  Negative for abdominal distention, abdominal pain, constipation and diarrhea.  Endocrine: Negative.   Genitourinary:  Negative for dysuria.  Musculoskeletal:  Positive for back pain. Negative for joint swelling and myalgias.  Skin:  Negative for rash.  Allergic/Immunologic:  Negative.   Neurological:  Negative for weakness, light-headedness and numbness.  Psychiatric/Behavioral:  Positive for hallucinations. Negative for dysphoric mood and suicidal ideas.    Objective:  BP (!) 145/97    Pulse 78    Ht 5' 8"  (1.727 m)    Wt 143 lb (64.9 kg)    SpO2 96%    BMI 21.74 kg/m   BP/Weight 05/15/2021 10/17/2798 05/12/9177  Systolic BP 150 569 794  Diastolic BP 97 82 98  Wt. (Lbs) 143 - -  BMI 21.74 - -      Physical Exam Constitutional:      Appearance: He is well-developed.  Cardiovascular:     Rate and Rhythm: Normal rate.     Heart sounds: Normal heart sounds. No murmur  heard. Pulmonary:     Effort: Pulmonary effort is normal.     Breath sounds: Normal breath sounds. No wheezing or rales.  Chest:     Chest wall: No tenderness.  Abdominal:     General: Bowel sounds are normal. There is no distension.     Palpations: Abdomen is soft. There is no mass.     Tenderness: There is no abdominal tenderness.  Musculoskeletal:     Right lower leg: No edema.     Left lower leg: No edema.     Comments: Right shoulder abduction restricted to 30 degrees; tenderness on range of motion and palpation.     Skin:    Comments: Mid upper back with a hard lump, immobile  Neurological:     Mental Status: He is alert and oriented to person, place, and time.  Psychiatric:        Mood and Affect: Mood normal.    CMP Latest Ref Rng & Units 04/12/2021 04/11/2021 03/27/2021  Glucose 70 - 99 mg/dL 342(H) 303(H) 240(H)  BUN 8 - 23 mg/dL 36(H) 28(H) 20  Creatinine 0.61 - 1.24 mg/dL 1.59(H) 1.54(H) 1.23  Sodium 135 - 145 mmol/L 129(L) 136 135  Potassium 3.5 - 5.1 mmol/L 4.8 4.1 4.8  Chloride 98 - 111 mmol/L 97(L) 100 101  CO2 22 - 32 mmol/L 22 24 21(L)  Calcium 8.9 - 10.3 mg/dL 8.9 9.3 9.5  Total Protein 6.5 - 8.1 g/dL 7.4 7.9 -  Total Bilirubin 0.3 - 1.2 mg/dL 1.0 0.9 -  Alkaline Phos 38 - 126 U/L 72 78 -  AST 15 - 41 U/L 17 20 -  ALT 0 - 44 U/L 16 16 -    Lipid Panel      Component Value Date/Time   CHOL 194 11/03/2019 1003   TRIG 204 (H) 11/03/2019 1003   HDL 60 11/03/2019 1003   CHOLHDL 3.2 11/03/2019 1003   CHOLHDL 3.6 05/22/2016 0852   VLDL 32 (H) 05/03/2015 0954   LDLCALC 99 11/03/2019 1003    CBC    Component Value Date/Time   WBC 11.0 (H) 04/12/2021 0946   RBC 4.18 (L) 04/12/2021 0946   HGB 13.2 04/12/2021 0946   HGB 14.3 11/03/2019 1003   HCT 39.3 04/12/2021 0946   HCT 43.4 11/03/2019 1003   PLT 304 04/12/2021 0946   PLT 295 11/03/2019 1003   MCV 94.0 04/12/2021 0946   MCV 94 11/03/2019 1003   MCH 31.6 04/12/2021 0946   MCHC 33.6 04/12/2021 0946   RDW 13.2 04/12/2021 0946   RDW 12.4 11/03/2019 1003   LYMPHSABS 2.2 04/12/2021 0828   LYMPHSABS 1.9 11/03/2019 1003   MONOABS 0.6 04/12/2021 0828   EOSABS 0.1 04/12/2021 0828   EOSABS 0.3 11/03/2019 1003   BASOSABS 0.1 04/12/2021 0828   BASOSABS 0.1 11/03/2019 1003    Lab Results  Component Value Date   HGBA1C >15 05/15/2021    Assessment & Plan:  1. Uncontrolled type 2 diabetes mellitus with hyperglycemia (HCC) Severe hyperglycemia with CBG of 462 due to noncompliance with medication regimen NovoLog 20 units administered and oral hydration provided A1c is greater than 15 Review of his chart indicates he was previously on Lantus 18 units a while back but he has not been adherent with his medications and is difficult to know what he is really on I will have him come into the clinic tomorrow with all his medications so we can perform medication reconciliation. Advised to take his medications as  soon as he gets home tonight - POCT glucose (manual entry) - POCT glycosylated hemoglobin (Hb A1C) - insulin aspart (novoLOG) injection 20 Units - POCT glucose (manual entry)  2. Hypertension associated with diabetes ([place]) Uncontrolled due to medication nonadherence Discussed the implications of his actions and complications of uncontrolled hypertension  3.  Living in homeless  shelter Unfortunately he is plagued by social determinants of health We have provided him with some canned food to take home with him He has no cell phone and we unable to reach him when he leaves the clinic We obtain transportation for him today and he has been advised to come back to the clinic tomorrow so we can assist with resources for him  4. Adhesive capsulitis of right shoulder Uncontrolled Would like to refer to orthopedic for cortisone injection but he declines because he has not transportation  5. Chronic bilateral low back pain with bilateral sciatica Uncontrolled Nonadherence with medication Advised to restart medication Will benefit from spine specialist evaluation  6. Non-compliance Medication nonadherence has been a major challenge  7. Left kidney mass Suspicious for malignancy This could also explain his weight loss He needs to see urology ASAP We will have the case manager coordinate with him tomorrow to set up transportation for him to get to see urology  8. Dermoid cyst of skin of back - Ambulatory referral to General Surgery  9. Hallucinations - Ambulatory referral to Psychiatry   I have reviewed his medications and he has refills in the pharmacy   Meds ordered this encounter  Medications   insulin aspart (novoLOG) injection 20 Units   60 minutes of total face to face time spent including median intraservice time reviewing previous notes and test results, counseling patient on diagnosis and work up of weight loss, hyperglycemia in addition to management of chronic medical conditions.Time also spent ordering medications, investigations and documenting in the chart.  All questions were answered to the patient's satisfaction   Follow-up: Return in about 1 week (around 05/22/2021) for Medication reconciliation with [facility].       [name], MD, FAAFP. [facility] [place], [place]   05/15/2021, 6:20  PM

## 2021-05-15 NOTE — Telephone Encounter (Signed)
Pt returned call and was told that transportation would be outside at 3:00pm at [address] Pt verbalized understanding and appreciation. ?

## 2021-05-15 NOTE — Telephone Encounter (Signed)
He complains of possible hallucinations and stopped his medications as a result.  Can you please connect him with [facility] behavioral health?  Thank you. ?

## 2021-05-16 ENCOUNTER — Telehealth: Payer: Self-pay | Admitting: Family Medicine

## 2021-05-16 ENCOUNTER — Ambulatory Visit: Payer: Medicaid Other | Admitting: Dietician

## 2021-05-16 NOTE — Telephone Encounter (Signed)
I saw him yesterday and he is having transportation challenges which has kept him from following up with his appointments.  He needs to see urology ASAP due to findings concerning for renal carcinoma.  When urology called him to schedule he canceled due to lack of transportation.  I also need him to return to the clinic for medication reconciliation as I am unable to verify what medications he is actually taking.  He also complains of difficulty with food.  Can you please assist?  Thank you ?

## 2021-05-17 NOTE — Telephone Encounter (Signed)
Call placed to patient, left message and he called right back.  He said he found his phone. ? ?Discussed the issues he has with transportation to medical appointments. Provided him with the phone number for [facility] # [phone] and instructed him to use this number to call and schedule rides to his appointments and to call this CM if he has any questions. Then provided him with the phone number to re-schedule his appointment with the diabetic educator.  ?He also needs to re-schedule his appointment with urology.  ? ?Explained to him the need for medication reconciliation.  There are no appointments available with [facility] Halsted, [place] until April. The earliest appointment with Dr Margarita Rana is 05/29/2021.  Informed him that I will check with Dr Margarita Rana to see if she can see him any sooner.  ? ?Regarding food, he receives 3 meals/day at the [facility].  A referral could be made to [facility] but they provide food to prepare meals and he only has a microwave.  Will need to check additional resources. ? ?He explained that he would like to speak with someone about his depression. Informed him that our LCSW is trying to reach him and I will let her know that his phone is now working.  He was very appreciative of the assistance.  ? ?I will check back with him next week about the status of his appointments/rides.  ?

## 2021-05-17 NOTE — Telephone Encounter (Signed)
David Wyatt can you assist me with this? ?

## 2021-05-17 NOTE — Telephone Encounter (Signed)
He has no phone number and is currently homeless residing in a shelter.  David Wyatt does have a means of contacting him and might be able to share the appropriate contact info with you.  Thanks ?

## 2021-05-17 NOTE — Telephone Encounter (Signed)
Pt called back regarding info in my chart from yesterday. Please call back ?

## 2021-05-17 NOTE — Telephone Encounter (Signed)
I attempted to call this pt, no answer, left vm

## 2021-05-18 NOTE — Telephone Encounter (Signed)
I can work him into my schedule.  Please work with Angelina Sheriff to work him in sooner but I will need him to bring all his medications in for that appointment.  Thank you. ?

## 2021-05-18 NOTE — Telephone Encounter (Signed)
Patient called in stated that he was given a number for transportation on 05/17/21 but he have misplaced the number asking if he can get a call back with that number please. Can be reached at Ph# [phone] ?

## 2021-05-18 NOTE — Telephone Encounter (Signed)
I attempted to call pt again, no answer, left vm

## 2021-05-21 NOTE — Telephone Encounter (Signed)
Pt calling back requesting a call back. Pt requested call back at [phone] ? ?Please advise.  ?

## 2021-05-21 NOTE — Telephone Encounter (Signed)
Call returned to patient. He explained that he is having a hard time remembering things. He misplaced the phone number for [facility]. Explained to him that I will speak to Dr Smitty Pluck nurse to schedule him for an appointment with her and then we will arrange transportation.  He was very pleased. Informed him that I will call him back after scheduling that appointment.  ?

## 2021-05-22 NOTE — Telephone Encounter (Signed)
Pt called back and requested to speak with Opal Sidles, please advise.  ?

## 2021-05-22 NOTE — Telephone Encounter (Signed)
Call placed to patient and scheduled him to see Dr Margarita Rana for med reconciliation tomorrow - 05/23/2021 @ 0910. Transportation arranged through [facility] # [id number] and informed patient that he needs to be ready for pick up at the front of his motel by 0900 at the latest and he said he would be there by 0845. He repeated the time of pick up multiple times.  I instructed him to bring all of his medications with him tomorrow and also repeated this instruction multiple times.  ?

## 2021-05-22 NOTE — Telephone Encounter (Signed)
I contacted this pt. He mentioned that he has been going 3-4 days without sleeping and that's when he noticed the visual hallucinations. I explained to him that lack of sleep can cause him to experience hallucinations. He shared that he recently got a few hours of sleep and noticed the hallucinations decrease. He has an appt with you in the morning. I will touch base with him then.

## 2021-05-23 ENCOUNTER — Other Ambulatory Visit: Payer: Self-pay

## 2021-05-23 ENCOUNTER — Ambulatory Visit (HOSPITAL_BASED_OUTPATIENT_CLINIC_OR_DEPARTMENT_OTHER): Payer: Medicaid Other | Admitting: Clinical

## 2021-05-23 ENCOUNTER — Encounter: Payer: Self-pay | Admitting: Family Medicine

## 2021-05-23 ENCOUNTER — Ambulatory Visit: Payer: Medicaid Other | Attending: Family Medicine | Admitting: Family Medicine

## 2021-05-23 ENCOUNTER — Telehealth: Payer: Self-pay

## 2021-05-23 VITALS — BP 129/87 | HR 96 | Ht 68.0 in | Wt 142.0 lb

## 2021-05-23 DIAGNOSIS — F333 Major depressive disorder, recurrent, severe with psychotic symptoms: Secondary | ICD-10-CM | POA: Diagnosis not present

## 2021-05-23 DIAGNOSIS — R443 Hallucinations, unspecified: Secondary | ICD-10-CM

## 2021-05-23 DIAGNOSIS — Z1211 Encounter for screening for malignant neoplasm of colon: Secondary | ICD-10-CM

## 2021-05-23 DIAGNOSIS — T148XXA Other injury of unspecified body region, initial encounter: Secondary | ICD-10-CM

## 2021-05-23 DIAGNOSIS — E1165 Type 2 diabetes mellitus with hyperglycemia: Secondary | ICD-10-CM | POA: Diagnosis not present

## 2021-05-23 DIAGNOSIS — G4709 Other insomnia: Secondary | ICD-10-CM | POA: Diagnosis not present

## 2021-05-23 LAB — GLUCOSE, POCT (MANUAL RESULT ENTRY): POC Glucose: 246 mg/dl — AB (ref 70–99)

## 2021-05-23 MED ORDER — HYDROXYZINE HCL 25 MG PO TABS
25.0000 mg | ORAL_TABLET | Freq: Every day | ORAL | 2 refills | Status: DC
  Filled 2021-05-23: qty 30, 30d supply, fill #0

## 2021-05-23 NOTE — Patient Instructions (Addendum)
[facility] they will contact the patient to schedule an appointment  ?Ph.# [phone] Fax # [phone] address [place] [place]  ?[address]  ?

## 2021-05-23 NOTE — Progress Notes (Incomplete)
Error

## 2021-05-23 NOTE — Progress Notes (Signed)
[place] [address] out of bed this morning. ? ?

## 2021-05-23 NOTE — Progress Notes (Signed)
? ?Subjective:  ?Patient ID: David Wyatt, male    DOB: May 17, 1960  Age: 61 y.o. MRN: [id number] ? ?CC: Medication Management ? ? ?HPI ?David Wyatt is a 61 y.o. year old male with a history of  type 2 diabetes mellitus (A1c >15),  Homelessness, diabetic neuropathy, hypertension, chronic low back pain (status post previous back surgery), medication nonadherence ? ?Interval History: ?I referred him to behavioral health at his last visit due to his hallucinations and LCSW spoke to him and he endorsed hallucinations occurring after he had not had adequate sleep and symptoms resolved when he got some sleep. ? ?Complains of insomnia and tossing and turning in bed. ?[place] out of bed while he was tossing and turning this morning and states he was too close to bed so he fell off and hit his L ear. ?At his last visit his glucose was severely elevated and he had stopped all his medications and was unsure what he was taking.  This visit was scheduled to review his medications and help him get back on track.  Blood sugar in the clinic is 246, he is yet to take any medications of his and is fasting today. ?Past Medical History:  ?Diagnosis Date  ? Angioedema of lips 07/28/2012  ? left upper (07/29/2012)  ? Arthritis   ? hands and back  ? Chronic back pain   ? Cocaine abuse ([place])   ? Diabetic neuropathy ([place])   ? High cholesterol   ? Hypertension   ? Neuropathy   ? Type II diabetes mellitus ([place])   ? ? ?Past Surgical History:  ?Procedure Laterality Date  ? BACK SURGERY    ? CYSTOSCOPY W/ URETERAL STENT PLACEMENT Right 02/23/2021  ? Procedure: CYSTOSCOPY WITH RETROGRADE PYELOGRAM/URETERAL STENT PLACEMENT;  Surgeon: Janith Lima, MD;  Location: [facility];  Service: Urology;  Laterality: Right;  ? HERNIA REPAIR Right 04/01/2012  ? I & D EXTREMITY Left 12/13/2012  ? Procedure: IRRIGATION AND DEBRIDEMENT LEFT THUMB;  Surgeon: Tennis Must, MD;  Location: [facility];  Service: Orthopedics;  Laterality: Left;  ? INCISION AND DRAINAGE OF WOUND     ? boil on back/notes 07/14/2008 (07/29/2012)  ? INGUINAL HERNIA REPAIR  04/01/2012  ? Procedure: HERNIA REPAIR INGUINAL ADULT;  Surgeon: Haywood Lasso, MD;  Location: [facility];  Service: General;  Laterality: Right;  ? INGUINAL HERNIA REPAIR Left 09/06/2016  ? Procedure: OPEN REPAIR LEFT INGUINAL HERNIA;  Surgeon: Greer Pickerel, MD;  Location: [facility];  Service: General;  Laterality: Left;  ? INSERTION OF MESH Left 09/06/2016  ? Procedure: INSERTION OF MESH;  Surgeon: Greer Pickerel, MD;  Location: [name];  Service: General;  Laterality: Left;  ? TONSILLECTOMY    ? ? ?Family History  ?Problem Relation Age of Onset  ? Diabetes Mother   ? Kidney disease Mother   ? Hyperlipidemia Mother   ? Hyperlipidemia Father   ? Diabetes Father   ? ? ?Social History  ? ?Socioeconomic History  ? Marital status: Divorced  ?  Spouse name: Not on file  ? Number of children: 3  ? Years of education: 4  ? Highest education level: Not on file  ?Occupational History  ?  Comment: disabled  ?Tobacco Use  ? Smoking status: Every Day  ?  Packs/day: 0.25  ?  Years: 30.00  ?  Pack years: 7.50  ?  Types: Cigarettes  ? Smokeless tobacco: Former  ?  Quit date: 08/15/2015  ? Tobacco comments:  ?  04/29/16 2  cigs daily, 10/27/17 sometimes < .25 PPD  ?Vaping Use  ? Vaping Use: Never used  ?Substance and Sexual Activity  ? Alcohol use: No  ?  Alcohol/week: 0.0 standard drinks  ? Drug use: Yes  ?  Types: "Crack" cocaine  ? Sexual activity: Not on file  ?Other Topics Concern  ? Not on file  ?Social History Narrative  ? 04/29/17   Lives in shelter  ? Caffeine- a lot of  tea, coffee  ? ?Social Determinants of Health  ? ?Financial Resource Strain: Not on file  ?Food Insecurity: Not on file  ?Transportation Needs: Not on file  ?Physical Activity: Not on file  ?Stress: Not on file  ?Social Connections: Not on file  ? ? ?Allergies  ?Allergen Reactions  ? Lisinopril Other (See Comments)  ?  Lip Swelling, angioedema  ? ? ?Outpatient Medications Prior to Visit  ?Medication  Sig Dispense Refill  ? amLODipine (NORVASC) 10 MG tablet Take 1 tablet (10 mg total) by mouth daily. 30 tablet 6  ? atorvastatin (LIPITOR) 40 MG tablet Take 1 tablet (40 mg total) by mouth daily. 30 tablet 6  ? cyclobenzaprine (FLEXERIL) 10 MG tablet Take 1 tablet (10 mg total) by mouth 2 (two) times daily as needed for muscle spasms. 40 tablet 6  ? DULoxetine (CYMBALTA) 60 MG capsule Take 1 capsule (60 mg total) by mouth daily. 30 capsule 6  ? empagliflozin (JARDIANCE) 10 MG TABS tablet Take 1 tablet (10 mg total) by mouth daily before breakfast. 30 tablet 6  ? gabapentin (NEURONTIN) 300 MG capsule Take 2 capsules (600 mg total) by mouth 3 (three) times daily. 180 capsule 6  ? glipiZIDE (GLUCOTROL XL) 10 MG 24 hr tablet Take 1 tablet (10 mg total) by mouth daily with breakfast. 30 tablet 6  ? glucose blood test strip Test twice daily 100 each 2  ? Insulin Pen Needle 32G X 4 MM MISC Use with Victoza 100 each 0  ? liraglutide (VICTOZA) 18 MG/3ML SOPN Inject 0.6 mg once daily 36 mL 6  ? metFORMIN (GLUCOPHAGE) 1000 MG tablet Take 1 tablet (1,000 mg total) by mouth 2 (two) times daily. 60 tablet 6  ? ondansetron (ZOFRAN) 4 MG tablet Take 1 tablet (4 mg total) by mouth every 6 (six) hours. 12 tablet 0  ? Accu-Chek Softclix Lancets lancets Test blood sugar 2 times daily 100 each 6  ? Blood Glucose Monitoring Suppl (ACCU-CHEK GUIDE) w/Device KIT USE AS DIRECTED TO CHECK BLOOD SUGARS UP TO TWICE PER DAY (Patient taking differently: 1 each by Other route 2 (two) times daily.) 1 kit 0  ? Blood Glucose Monitoring Suppl (ACCU-CHEK GUIDE) w/Device KIT Use as directed (Patient taking differently: 1 each by Other route 2 (two) times daily.) 1 kit 0  ? ?Facility-Administered Medications Prior to Visit  ?Medication Dose Route Frequency Provider Last Rate Last Admin  ? insulin aspart (novoLOG) injection 20 Units  20 Units Subcutaneous Once Mayers, Cari S, PA-C      ? ? ? ?ROS ?Review of Systems  ?Constitutional:  Negative for  activity change and appetite change.  ?HENT:  Negative for sinus pressure and sore throat.   ?Eyes:  Negative for visual disturbance.  ?Respiratory:  Negative for cough, chest tightness, shortness of breath and wheezing.   ?Cardiovascular:  Negative for chest pain, palpitations and leg swelling.  ?Gastrointestinal:  Negative for abdominal distention, abdominal pain, constipation and diarrhea.  ?Endocrine: Negative.   ?Genitourinary: Negative.  Negative  for dysuria.  ?Musculoskeletal:   ?     See HPI  ?Skin:  Negative for rash.  ?Allergic/Immunologic: Negative.   ?Neurological:  Negative for weakness, light-headedness and numbness.  ?Psychiatric/Behavioral:  Negative for behavioral problems, dysphoric mood and suicidal ideas.   ? ?Objective:  ?BP 129/87   Pulse 96   Ht 5' 8"  (1.727 m)   Wt 142 lb (64.4 kg)   SpO2 99%   BMI 21.59 kg/m?  ? ?BP/Weight 05/23/2021 05/15/2021 04/12/2021  ?Systolic BP 536 644 034  ?Diastolic BP 87 97 82  ?Wt. (Lbs) 142 143 -  ?BMI 21.59 21.74 -  ? ? ? ? ?Physical Exam ?Constitutional:   ?   Appearance: He is well-developed.  ?HENT:  ?   Ears:  ?   Comments: Slight tenderness on palpation of superior aspect of R auricle ?Cardiovascular:  ?   Rate and Rhythm: Normal rate.  ?   Heart sounds: Normal heart sounds. No murmur heard. ?Pulmonary:  ?   Effort: Pulmonary effort is normal.  ?   Breath sounds: Normal breath sounds. No wheezing or rales.  ?Chest:  ?   Chest wall: No tenderness.  ?Abdominal:  ?   General: Bowel sounds are normal. There is no distension.  ?   Palpations: Abdomen is soft. There is no mass.  ?   Tenderness: There is no abdominal tenderness.  ?Musculoskeletal:  ?   Right lower leg: No edema.  ?   Left lower leg: No edema.  ?   Comments: Severely restricted right shoulder abduction  ?Neurological:  ?   Mental Status: He is alert and oriented to person, place, and time.  ?Psychiatric:     ?   Mood and Affect: Mood normal.  ? ? ?CMP Latest Ref Rng & Units 04/12/2021 04/11/2021  03/27/2021  ?Glucose 70 - 99 mg/dL 342(H) 303(H) 240(H)  ?BUN 8 - 23 mg/dL 36(H) 28(H) 20  ?Creatinine 0.61 - 1.24 mg/dL 1.59(H) 1.54(H) 1.23  ?Sodium 135 - 145 mmol/L 129(L) 136 135  ?Potassium 3.5 - 5.1 mmol/L 4.8 4.1

## 2021-05-23 NOTE — Telephone Encounter (Signed)
Met with the patient when he was in the clinic today. Provided him with the phone number for [facility] [employer] to schedule rides to his medical appointments.  ?Doloris Hall, CMA assisted him with organizing and labeling his medications.  ?Arranged for [place] transportation to take him back home.  ?

## 2021-05-24 ENCOUNTER — Telehealth (INDEPENDENT_AMBULATORY_CARE_PROVIDER_SITE_OTHER): Payer: Self-pay | Admitting: Family Medicine

## 2021-05-24 LAB — LP+NON-HDL CHOLESTEROL
Cholesterol, Total: 213 mg/dL — ABNORMAL HIGH (ref 100–199)
HDL: 71 mg/dL (ref >39)
LDL Chol Calc (NIH): 122 mg/dL — ABNORMAL HIGH (ref 0–99)
Total Non-HDL-Chol (LDL+VLDL): 142 mg/dL — ABNORMAL HIGH (ref 0–129)
Triglycerides: 115 mg/dL (ref 0–149)
VLDL Cholesterol Cal: 20 mg/dL (ref 5–40)

## 2021-05-24 LAB — CMP14+EGFR
ALT: 15 IU/L (ref 0–44)
AST: 12 IU/L (ref 0–40)
Albumin/Globulin Ratio: 1.4 (ref 1.2–2.2)
Albumin: 4.2 g/dL (ref 3.8–4.8)
Alkaline Phosphatase: 90 IU/L (ref 44–121)
BUN/Creatinine Ratio: 25 — ABNORMAL HIGH (ref 10–24)
BUN: 27 mg/dL (ref 8–27)
Bilirubin Total: 0.9 mg/dL (ref 0.0–1.2)
CO2: 20 mmol/L (ref 20–29)
Calcium: 9.8 mg/dL (ref 8.6–10.2)
Chloride: 101 mmol/L (ref 96–106)
Creatinine, Ser: 1.1 mg/dL (ref 0.76–1.27)
Globulin, Total: 3 g/dL (ref 1.5–4.5)
Glucose: 237 mg/dL — ABNORMAL HIGH (ref 70–99)
Potassium: 4.7 mmol/L (ref 3.5–5.2)
Sodium: 141 mmol/L (ref 134–144)
Total Protein: 7.2 g/dL (ref 6.0–8.5)
eGFR: 76 mL/min/{1.73_m2} (ref >59)

## 2021-05-24 LAB — MICROALBUMIN / CREATININE URINE RATIO
Creatinine, Urine: 59 mg/dL
Microalb/Creat Ratio: 154 mg/g creat — ABNORMAL HIGH (ref 0–29)
Microalbumin, Urine: 90.6 ug/mL

## 2021-05-24 NOTE — Telephone Encounter (Signed)
Copied from [place] [id number]. Topic: General - Other >> May 22, 2021  5:15 PM Yvette Rack wrote: Reason for CRM: Pt called once again requesting to speak with Opal Sidles to confirm time his ride should pick him up for his appt.

## 2021-05-24 NOTE — Telephone Encounter (Signed)
This message is from 05/22/2021 and the patient was seen in the clinic for his appointment yesterday - 05/23/2021.  ?

## 2021-05-25 NOTE — BH Specialist Note (Signed)
Integrated Behavioral Health Initial In-Person Visit ? ?MRN: [id number] ?Name: David Wyatt ? ?Number of [facility] Clinician visits: 1- Initial Visit ? ?Session Start time: 8003 ?   ?Session End time: 4917 ? ?Total time in minutes: 30 ? ? ?Types of Service: Individual psychotherapy ? ?Interpretor:No. Interpretor Name and Language: N/a ? ? Warm Hand Off Completed. ?  ? ?  ? ? ?Subjective: ?David Wyatt is a 61 y.o. male accompanied by  self ?Patient was referred by David Rakes, MD for hallucinations. ?Patient reports the following symptoms/concerns: Reports feeling depressed and anxious. Reports that he has not been sleeping. Reports gong 3-4 days without sleeping. Reports that he has been worrying excessively about housing. Reports that he has never experienced this. Reports that he recently started experiencing visual hallucinations. Reports difficulty with memory. Reports that he has to be out of the [facility] by 06/08/21 due to the weather changing. Reports that he doesn't have anywhere else to go. ?Duration of problem: 2 weeks; Severity of problem: severe ? ?Objective: ?Mood: Anxious and Depressed and Affect: Appropriate ?Risk of harm to self or others: No plan to harm self or others ? ?Life Context: ?Family and Social: No reports on support. ?School/Work: Pt receives disability. ?Self-Care: Pt currently taking cymbalta and hydroxyzine. ?Life Changes: Pt is staying at the [facility] and has to leave by 06/08/21 due to it getting warm outside. Pt is worried that he won't have anywhere to sleep. ? ?Patient and/or Family's Strengths/Protective Factors: ?Social connections ? ?Goals Addressed: ?Patient will: ?Reduce symptoms of: anxiety, depression, and insomnia ?Increase knowledge and/or ability of: coping skills  ?Demonstrate ability to: Increase healthy adjustment to current life circumstances and Increase adequate support systems for patient/family ? ?Progress towards  Goals: ?Ongoing ? ?Interventions: ?Interventions utilized: Optician, dispensing, CBT Cognitive Behavioral Therapy, and Supportive Counseling  ?Standardized Assessments completed: Not Needed ? ?Patient and/or Family Response: Pt receptive to psychoeducation on insomnia and depression. Pt receptive to mindfulness and incorporating this daily to assist with sleep. Pt will adhere to medication. ? ?Patient Centered Plan: ?Patient is on the following Treatment Plan(s):  Depression, insomnia ? ?Assessment: ?Denies SI/HI. Endorses visual hallucinations. Denies auditory hallucinations. Patient currently experiencing depression and insomnia. Pt appears to be experiencing hallucinations due to lack of sleep. Pt is worrying about housing and physical health. ?  ?Patient may benefit from mindfulness. LCSW provided psychoeducation on depression and insomnia. LCSW utilized mindfulness to assist pt. LCSW encouraged pt to incorporate mindfulness and adhere to medication. LCSW provided resources on housing and pt is requesting assisted living. LCSW will fu with pt. ? ?Plan: ?Follow up with behavioral health clinician on : 06/18/21 ?Behavioral recommendations: Utilize mindfulness and adhere to medication. ?Referral(s): [place] (In Clinic) ?"From scale of 1-10, how likely are you to follow plan?": 10 ? ?Cherilyn Sautter C Amaziah Ghosh, LCSW ? ? ? ? ? ? ? ? ?

## 2021-05-29 ENCOUNTER — Ambulatory Visit: Payer: Medicaid Other | Admitting: Family Medicine

## 2021-06-04 ENCOUNTER — Encounter: Payer: Self-pay | Admitting: *Deleted

## 2021-06-04 NOTE — Congregational Nurse Program (Signed)
?  Dept: [phone] ? ? ?Congregational Nurse Program Note ? ?Date of Encounter: 06/01/2021 ? ?Past Medical History: ?Past Medical History:  ?Diagnosis Date  ? Angioedema of lips 07/28/2012  ? left upper (07/29/2012)  ? Arthritis   ? hands and back  ? Chronic back pain   ? Cocaine abuse ([place])   ? Diabetic neuropathy ([place])   ? High cholesterol   ? Hypertension   ? Neuropathy   ? Type II diabetes mellitus ([place])   ? ? ?Encounter Details: ? CNP Questionnaire - 06/01/21 1000   ? ?  ? Questionnaire  ? Do you give verbal consent to treat you today? Yes   ? Location Patient Served  [facility]   ? Visit Setting Church or Organization   ? Patient Status Homeless   ? Insurance Medicaid   ? Insurance Referral N/A   ? Medication N/A   ? Medical Provider Yes   ? Screening Referrals N/A   ? Medical Referral N/A   ? Medical Appointment Made N/A   ? Food N/A   ? Transportation N/A   ? Housing/Utilities No permanent housing   ? Interpersonal Safety N/A   ? Intervention Blood glucose   ? ED Visit Averted N/A   ? Life-Saving Intervention Made N/A   ? ?  ?  ? ?  ? ?Client seen at [place] by [facility]. Client had a cbg check. CBG elevated at 284. Encouraged client to take medications as prescribed and follow up with PCP.  ?Elaisha Zahniser W RN CN ? ? ?

## 2021-06-05 ENCOUNTER — Telehealth: Payer: Self-pay

## 2021-06-05 NOTE — Telephone Encounter (Signed)
Attempted to contact patient # [phone] to discuss the option of ALF. There is a bed available at [facility].  Voicemail was full, unable to leave a message.  ? ?Message left for Desma Paganini, CNP requesting a call back to discuss this option for patient  ?

## 2021-06-06 NOTE — Telephone Encounter (Signed)
Call received from patient.  ?David McCoy, LCSW present for the call.  ? ?He explained that they have extended his stay at [facility] for another week.  He said he is doing better and is planning to move into a place of his own with the rent about $450/month. Explained to him that I was calling him about ALF and bed availability at [facility].  ? ?I discussed living arrangements including sharing a room, receiving 3 meals/day, assistance with medication management and that the facility will keep all but $30-60 of his monthly check to pay toward his care.  ? ?He was not interested in this option after hearing that he would have to relinquish his money. He said he is planning to purchase a car and will need money for gas, insurance and he really wants his own place.  ?  ?He appreciated the call with the information but is not interested in ALF at this time.  ?

## 2021-06-06 NOTE — Telephone Encounter (Signed)
Attempted to contact patient # [phone] to discuss the option of ALF. There is a bed available at [facility].  Voicemail message left with call back requested.  ?

## 2021-06-18 ENCOUNTER — Ambulatory Visit: Payer: Medicaid Other | Admitting: Clinical

## 2021-06-22 ENCOUNTER — Encounter (HOSPITAL_COMMUNITY): Payer: Self-pay

## 2021-06-22 ENCOUNTER — Emergency Department (HOSPITAL_COMMUNITY)
Admission: EM | Admit: 2021-06-22 | Discharge: 2021-06-23 | Disposition: A | Discharge status: Home / Self Care | Payer: Medicaid Other | Attending: Emergency Medicine | Admitting: Emergency Medicine

## 2021-06-22 ENCOUNTER — Other Ambulatory Visit: Payer: Self-pay

## 2021-06-22 ENCOUNTER — Emergency Department (HOSPITAL_COMMUNITY): Payer: Medicaid Other

## 2021-06-22 DIAGNOSIS — Z960 Presence of urogenital implants: Secondary | ICD-10-CM | POA: Diagnosis not present

## 2021-06-22 DIAGNOSIS — Z79899 Other long term (current) drug therapy: Secondary | ICD-10-CM | POA: Diagnosis not present

## 2021-06-22 DIAGNOSIS — I10 Essential (primary) hypertension: Secondary | ICD-10-CM | POA: Insufficient documentation

## 2021-06-22 DIAGNOSIS — N2889 Other specified disorders of kidney and ureter: Secondary | ICD-10-CM | POA: Insufficient documentation

## 2021-06-22 DIAGNOSIS — D72829 Elevated white blood cell count, unspecified: Secondary | ICD-10-CM | POA: Diagnosis not present

## 2021-06-22 DIAGNOSIS — E1140 Type 2 diabetes mellitus with diabetic neuropathy, unspecified: Secondary | ICD-10-CM | POA: Diagnosis not present

## 2021-06-22 DIAGNOSIS — Z7984 Long term (current) use of oral hypoglycemic drugs: Secondary | ICD-10-CM | POA: Insufficient documentation

## 2021-06-22 DIAGNOSIS — R319 Hematuria, unspecified: Secondary | ICD-10-CM | POA: Diagnosis present

## 2021-06-22 LAB — COMPREHENSIVE METABOLIC PANEL
ALT: 44 U/L (ref 0–44)
AST: 29 U/L (ref 15–41)
Albumin: 3.5 g/dL (ref 3.5–5.0)
Alkaline Phosphatase: 83 U/L (ref 38–126)
Anion gap: 10 (ref 5–15)
BUN: 25 mg/dL — ABNORMAL HIGH (ref 8–23)
CO2: 22 mmol/L (ref 22–32)
Calcium: 8.8 mg/dL — ABNORMAL LOW (ref 8.9–10.3)
Chloride: 106 mmol/L (ref 98–111)
Creatinine, Ser: 1.11 mg/dL (ref 0.61–1.24)
GFR, Estimated: >60 mL/min (ref >60)
Glucose, Bld: 261 mg/dL — ABNORMAL HIGH (ref 70–99)
Potassium: 4.4 mmol/L (ref 3.5–5.1)
Sodium: 138 mmol/L (ref 135–145)
Total Bilirubin: 1.1 mg/dL (ref 0.3–1.2)
Total Protein: 7.2 g/dL (ref 6.5–8.1)

## 2021-06-22 LAB — URINALYSIS, ROUTINE W REFLEX MICROSCOPIC
Bacteria, UA: NONE SEEN
Bilirubin Urine: NEGATIVE
Glucose, UA: >=500 mg/dL — AB
Ketones, ur: 5 mg/dL — AB
Nitrite: NEGATIVE
Protein, ur: 100 mg/dL — AB
RBC / HPF: >50 RBC/hpf — ABNORMAL HIGH (ref 0–5)
Specific Gravity, Urine: 1.029 (ref 1.005–1.030)
pH: 5 (ref 5.0–8.0)

## 2021-06-22 LAB — CBC WITH DIFFERENTIAL/PLATELET
Abs Immature Granulocytes: 0.13 10*3/uL — ABNORMAL HIGH (ref 0.00–0.07)
Basophils Absolute: 0 10*3/uL (ref 0.0–0.1)
Basophils Relative: 0 %
Eosinophils Absolute: 0.4 10*3/uL (ref 0.0–0.5)
Eosinophils Relative: 3 %
HCT: 43.9 % (ref 39.0–52.0)
Hemoglobin: 14.4 g/dL (ref 13.0–17.0)
Immature Granulocytes: 1 %
Lymphocytes Relative: 17 %
Lymphs Abs: 1.9 10*3/uL (ref 0.7–4.0)
MCH: 31.9 pg (ref 26.0–34.0)
MCHC: 32.8 g/dL (ref 30.0–36.0)
MCV: 97.1 fL (ref 80.0–100.0)
Monocytes Absolute: 0.8 10*3/uL (ref 0.1–1.0)
Monocytes Relative: 7 %
Neutro Abs: 8 10*3/uL — ABNORMAL HIGH (ref 1.7–7.7)
Neutrophils Relative %: 72 %
Platelets: 378 10*3/uL (ref 150–400)
RBC: 4.52 MIL/uL (ref 4.22–5.81)
RDW: 12.5 % (ref 11.5–15.5)
WBC: 11.2 10*3/uL — ABNORMAL HIGH (ref 4.0–10.5)
nRBC: 0 % (ref 0.0–0.2)

## 2021-06-22 NOTE — ED Provider Triage Note (Signed)
Emergency Medicine Provider Triage Evaluation Note ? ?David Wyatt , a 61 y.o. male  was evaluated in triage.  Patient has a history of hyperglycemia, diabetes mellitus, hyperlipidemia, homelessness.  Pt complains of right flank pain, right-sided abdominal pain, hematuria for the past week.  Reports difficulty urinating but no dysuria.  No fevers or chills.  He does note an episode of vomiting earlier today.  States he does not drink alcohol but does occasionally smoke crack cocaine which he lasted about 2 weeks ago. ? ?Per records, patient had a cystoscopy with retrograde pyelogram/ureteral stent placement on February 23, 2021 which was performed by Dr. Abner Greenspan.  Patient states that he has not followed up with urology since his procedure and believes that he still has the stent in place. ? ?Physical Exam  ?BP 124/76   Pulse (!) 112   Temp 99.1 ?F (37.3 ?C) (Oral)   Resp 16   SpO2 96%  ?Gen:   Awake, no distress   ?Resp:  Normal effort  ?MSK:   Moves extremities without difficulty  ?Other:  Mild to moderate tenderness noted to the right flank and right lateral abdomen. ? ?Medical Decision Making  ?Medically screening exam initiated at 5:21 PM.  Appropriate orders placed.  David Wyatt was informed that the remainder of the evaluation will be completed by another provider, this initial triage assessment does not replace that evaluation, and the importance of remaining in the ED until their evaluation is complete. ?  ?Rayna Sexton, PA-C ?06/22/21 1724 ? ?

## 2021-06-22 NOTE — ED Triage Notes (Signed)
Pt BIB EMS from hotel. Pt endorses right flank pain, dysuria, and hematuria x1 week. Reports being diagnosed with kidney stones a few weeks ago. Endorses N/V with eating. Denies fever.  ? ?HR 120 ?BP 120/70 ?RR 20 ?95% RA ?CBG 319 ? ?

## 2021-06-23 LAB — CBG MONITORING, ED: Glucose-Capillary: 283 mg/dL — ABNORMAL HIGH (ref 70–99)

## 2021-06-23 MED ORDER — OXYCODONE-ACETAMINOPHEN 5-325 MG PO TABS
2.0000 | ORAL_TABLET | Freq: Once | ORAL | Status: AC
  Administered 2021-06-23: 2 via ORAL
  Filled 2021-06-23: qty 2

## 2021-06-23 NOTE — ED Provider Notes (Signed)
?[facility] DEPT ?Provider Note ? ? ?CSN: [id number] ?Arrival date & time: 06/22/21  1705 ? ?  ? ?History ? ?Chief Complaint  ?Patient presents with  ? Hematuria  ?   ?  ? ? ?David Wyatt is a 61 y.o. male. ? ?The history is provided by the patient and medical records.  ?Hematuria ? ?61 year old male with history of chronic back pain, diabetes, neuropathy, hypertension, GERD, presenting to the ED with right flank pain and hematuria.  Patient states he had stent put in several months ago due to a kidney stone but lost his paperwork and never made it to urology office for follow-up.  He states he has intermittent hematuria.  Sometimes he also feels like he needs to urinate but cannot pass urine.  He has not had any trouble urinating in the past several days.  He denies any fevers.  Has not had any nausea or vomiting. ? ?Per chart review, has ureteral stent for large, obstructing right kidney stones.  Recommended for possible laser lithotripsy.  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  He has not had any follow-up for either of these aforementioned problems. ? ?Home Medications ?Prior to Admission medications   ?Medication Sig Start Date End Date Taking? Authorizing Provider  ?amLODipine (NORVASC) 10 MG tablet Take 1 tablet (10 mg total) by mouth daily. 03/22/21  Yes Mayers, Cari S, PA-C  ?atorvastatin (LIPITOR) 40 MG tablet Take 1 tablet (40 mg total) by mouth daily. 03/22/21  Yes Mayers, Cari S, PA-C  ?cyclobenzaprine (FLEXERIL) 10 MG tablet Take 1 tablet (10 mg total) by mouth 2 (two) times daily as needed for muscle spasms. 12/26/20  Yes [name], MD  ?DULoxetine (CYMBALTA) 60 MG capsule Take 1 capsule (60 mg total) by mouth daily. 03/22/21  Yes Mayers, Cari S, PA-C  ?gabapentin (NEURONTIN) 300 MG capsule Take 2 capsules (600 mg total) by mouth 3 (three) times daily. 03/22/21  Yes Mayers, Cari S, PA-C  ?glipiZIDE (GLUCOTROL XL) 10 MG 24 hr tablet  Take 1 tablet (10 mg total) by mouth daily with breakfast. 03/22/21  Yes Mayers, Cari S, PA-C  ?hydrOXYzine (ATARAX) 25 MG tablet Take 1 tablet (25 mg total) by mouth at bedtime. 05/23/21  Yes [name], MD  ?metFORMIN (GLUCOPHAGE) 1000 MG tablet Take 1 tablet (1,000 mg total) by mouth 2 (two) times daily. 03/22/21  Yes Mayers, Cari S, PA-C  ?ondansetron (ZOFRAN) 4 MG tablet Take 1 tablet (4 mg total) by mouth every 6 (six) hours. 03/22/21  Yes Mayers, Cari S, PA-C  ?Accu-Chek Softclix Lancets lancets Test blood sugar 2 times daily 03/22/21   Mayers, Loraine Grip, PA-C  ?Blood Glucose Monitoring Suppl (ACCU-CHEK GUIDE) w/Device KIT USE AS DIRECTED TO CHECK BLOOD SUGARS UP TO TWICE PER DAY ?Patient taking differently: 1 each by Other route 2 (two) times daily. 02/17/19   [name], MD  ?Blood Glucose Monitoring Suppl (ACCU-CHEK GUIDE) w/Device KIT Use as directed ?Patient taking differently: 1 each by Other route 2 (two) times daily. 11/03/20   Elsie Stain, MD  ?empagliflozin (JARDIANCE) 10 MG TABS tablet Take 1 tablet (10 mg total) by mouth daily before breakfast. ?Patient not taking: Reported on 06/23/2021 03/22/21   Mayers, Cari S, PA-C  ?glucose blood test strip Test twice daily 03/22/21   Mayers, Cari S, PA-C  ?loratadine (CLARITIN) 10 MG tablet Take 1 tablet (10 mg total) by mouth daily. ?Patient not taking: No sig reported 06/03/17 07/20/20  [name], MD  ?   ? ?Allergies    ?Lisinopril   ? ?Review of Systems   ?Review of Systems  ?Genitourinary:  Positive for hematuria.  ?All other systems reviewed and are negative. ? ?Physical Exam ?Updated Vital Signs ?BP 115/81   Pulse 88   Temp 99.1 ?F (37.3 ?C) (Oral)   Resp 16   SpO2 99%  ? ?Physical Exam ?Vitals and nursing note reviewed.  ?Constitutional:   ?   Appearance: He is well-developed.  ?HENT:  ?   Head: Normocephalic and atraumatic.  ?Eyes:  ?   Conjunctiva/sclera: Conjunctivae normal.  ?   Pupils: Pupils are equal, round, and reactive to light.   ?Cardiovascular:  ?   Rate and Rhythm: Normal rate and regular rhythm.  ?   Heart sounds: Normal heart sounds.  ?Pulmonary:  ?   Effort: Pulmonary effort is normal. No respiratory distress.  ?   Breath sounds: Normal breath sounds. No rhonchi.  ?Abdominal:  ?   General: Bowel sounds are normal.  ?   Palpations: Abdomen is soft.  ?   Tenderness: There is no abdominal tenderness. There is no rebound.  ?Musculoskeletal:     ?   General: Normal range of motion.  ?   Cervical back: Normal range of motion.  ?Skin: ?   General: Skin is warm and dry.  ?Neurological:  ?   Mental Status: He is alert and oriented to person, place, and time.  ? ? ?ED Results / Procedures / Treatments   ?Labs ?(all labs ordered are listed, but only abnormal results are displayed) ?Labs Reviewed  ?COMPREHENSIVE METABOLIC PANEL - Abnormal; Notable for the following components:  ?    Result Value  ? Glucose, Bld 261 (*)   ? BUN 25 (*)   ? Calcium 8.8 (*)   ? All other components within normal limits  ?CBC WITH DIFFERENTIAL/PLATELET - Abnormal; Notable for the following components:  ? WBC 11.2 (*)   ? Neutro Abs 8.0 (*)   ? Abs Immature Granulocytes 0.13 (*)   ? All other components within normal limits  ?URINALYSIS, ROUTINE W REFLEX MICROSCOPIC - Abnormal; Notable for the following components:  ? Color, Urine AMBER (*)   ? APPearance CLOUDY (*)   ? Glucose, UA >=500 (*)   ? Hgb urine dipstick LARGE (*)   ? Ketones, ur 5 (*)   ? Protein, ur 100 (*)   ? Leukocytes,Ua TRACE (*)   ? RBC / HPF >50 (*)   ? All other components within normal limits  ?CBG MONITORING, ED - Abnormal; Notable for the following components:  ? Glucose-Capillary 283 (*)   ? All other components within normal limits  ?URINE CULTURE  ? ? ?EKG ?None ? ?Radiology ?CT Renal Stone Study ? ?Result Date: 06/22/2021 ?CLINICAL DATA:  Flank pain, kidney stone suspected. Right flank pain, dysuria and hematuria for 1 week. EXAM: CT ABDOMEN AND PELVIS WITHOUT CONTRAST TECHNIQUE: Multidetector  CT imaging of the abdomen and pelvis was performed following the standard protocol without IV contrast. RADIATION DOSE REDUCTION: This exam was performed according to the departmental dose-optimization program which includes automated exposure control, adjustment of the mA and/or kV according to patient size and/or use of iterative reconstruction technique. COMPARISON:  CT examination dated April 12, 2021 FINDINGS: Lower chest: Smooth interlobular septal thickening with mild atelectasis representing mild fibrotic changes. Hepatobiliary: No focal liver abnormality is seen. No gallstones, gallbladder wall thickening, or biliary dilatation. Pancreas: Unremarkable. No  pancreatic ductal dilatation or surrounding inflammatory changes. Spleen: Normal in size without focal abnormality. Adrenals/Urinary Tract: Adrenal glands are unremarkable. Nonobstructing calculus in the lower pole of the right kidney. Right double-J ureteral stent in place. Nonobstructing calculus in the lower pole of the left kidney. No left ureteral calculus or hydronephrosis. Stomach/Bowel: Stomach is distended with ingested food. Bowel loops are normal in caliber. No evidence of colitis or diverticulitis. Appendix not visualized. Vascular/Lymphatic: Aortic atherosclerosis. No enlarged abdominal or pelvic lymph nodes. Reproductive: Prostate is normal in size with central coarse calcifications. Other: No abdominal wall hernia or abnormality. No abdominopelvic ascites. Musculoskeletal: Laminectomy and posterior spinal fusion at L4-L5. No acute osseous abnormality. IMPRESSION: 1. Bilateral nonobstructing renal calculi. Right ureteral double-J stent in place. Urinary bladder is unremarkable. If there is clinical concern for ongoing urinary tract infection, urinalysis could be obtained for further evaluation. 2. Bowel loops are normal in caliber. No evidence of obstruction, colitis or diverticulitis. 3. Degenerate disc disease of the lumbar spine with  posterior spinal fusion at L4-L5 with intact hardware. 4.  Additional chronic findings as above. Electronically Signed   By: Keane Police D.O.   On: 06/22/2021 18:00   ? ?Procedures ?Procedures  ? ? ?Medic

## 2021-06-23 NOTE — Discharge Instructions (Signed)
Please follow-up with the urology office as soon as possible. ?I have left message for the social work team to try to help facilitate this.   ?Return here for any new/acute changes. ?

## 2021-06-24 LAB — URINE CULTURE: Culture: <10000 — AB

## 2021-07-02 ENCOUNTER — Ambulatory Visit (HOSPITAL_COMMUNITY)
Admission: EM | Admit: 2021-07-02 | Discharge: 2021-07-02 | Disposition: A | Discharge status: Home / Self Care | Payer: Medicaid Other | Attending: Licensed Clinical Social Worker | Admitting: Licensed Clinical Social Worker

## 2021-07-02 DIAGNOSIS — Z5902 Unsheltered homelessness: Secondary | ICD-10-CM | POA: Insufficient documentation

## 2021-07-02 DIAGNOSIS — Z634 Disappearance and death of family member: Secondary | ICD-10-CM | POA: Insufficient documentation

## 2021-07-02 DIAGNOSIS — F321 Major depressive disorder, single episode, moderate: Secondary | ICD-10-CM

## 2021-07-02 NOTE — Discharge Instructions (Addendum)
Take all medications as prescribed. ?Keep all follow-up appointments as scheduled.  ?Do not consume alcohol or use illegal drugs while on prescription medications. ?Report any adverse effects from your medications to your primary care provider promptly.  ?In the event of recurrent symptoms or worsening symptoms, call 911, a crisis hotline, or go to the nearest emergency department for evaluation.   ? ?You are encouraged to follow up with [facility] for outpatient treatment. ? ?Walk in/ Open Access Hours: ?Monday - Friday 8AM - 11AM (To see provider and therapist) ?Friday - 1PM - 4PM (To see therapist only) ? ?[facility] ?[place], [place] ?[phone] ? ?

## 2021-07-02 NOTE — BH Assessment (Addendum)
Comprehensive Clinical Assessment (CCA) Note ? ?07/02/2021 ?[name] ?[id number] ? ?Disposition: Per Ricky Ala, NP outpatient treatment is recommended.  Disposition SW to pursue appropriate inpatient options. Patient plans to present to the [facility] upon d/c.  He has been provided with [facility] walk in information.   ? ?The patient demonstrates the following risk factors for suicide: Chronic risk factors for suicide include: psychiatric disorder of Situational Depression and demographic factors (male, >45 y/o). Acute risk factors for suicide include: loss (financial, interpersonal, professional). Protective factors for this patient include: positive social support, coping skills, and hope for the future. Considering these factors, the overall suicide risk at this point appears to be low. Patient is appropriate for outpatient follow up. ? ?Patient is a 61 year old male with a history of Situational Depression who presents voluntarily to [facility] for assessment.  Patient presents via [employer] voluntarily from the [facility] (for the homeless).  He has been there several months and he is at the end of his allotted time this week. Patient states he had planned to stay in his [place], however he owes "this guy" $250 by the end of the week to keep his [employer].  This is not a valid loan, and patient feels "he will take my [employer] [place]i and I'll be homeless again, on the streets Friday."  Patient has pleaded with the man to give him until Friday when he gets his disability check. He was feeling depressed about his circumstances this morning and told [facility] staff on-site at [facility] that he is feeling depressed and suicidal.  He reports he hasn't come out of his room for weeks, however he does report he found Jesus and is attending church "and praying."  Patient mentioned having suicidal thoughts this morning, however upon discussion of his belief about suicide, he laughed and joked, "You got me. I'm doing right now, and  I'm going to heaven." Patient states he came out of his hotel room and "people are willing to help when I ask."  He shares the [facility] staff are working on other shelter options today.  He is able to contract for safety and states he will go to "[facility], main office and see if they can help.  If they can't help me by Friday?  I can't be back on the streets, I'll be depressed and I'll come right back here."  Current depressive episode appears situational and he is willing to take action by going to [facility] to speak to case management. ? ? ?Chief Complaint: No chief complaint on file. ? ?Visit Diagnosis: Situational Depression  ?[facility] ED from 07/02/2021 in [facility] Office Visit from 12/26/2020 in [place] Office Visit from 11/17/2018 in [place]  ?Thoughts that you would be better off dead, or of hurting yourself in some way Several days Not at all Not at all  ?PHQ-9 Total Score 7 10 0  ? ?  ? ?[facility] ED from 07/02/2021 in [facility] ED from 06/22/2021 in [place] DEPT ED from 04/12/2021 in [place] DEPT  ?C-SSRS RISK CATEGORY Error: Q7 should not be populated when Q6 is No No Risk No Risk  ? ?  ? ?Risk=low today ? ?CCA Screening, Triage and Referral (STR) ? ?Patient Reported Information ?How did you hear about [place]? Self ? ?What Is the Reason for Your Visit/Call Today? Patient presents via [employer]  voluntarily from the [place] (for the homeless).  He has been there several months and he is at the end of his allotted time this week. Patient states he had planned to stay in his [place], however he owes "this guy" $250 by the end of the week to keep his [employer].  This is not a valid loan, and patient feels "he will take my [employer]i and I'll be homeless again, on the streets Friday."  Patient has pleaded with the man to give him until Friday when  he gets his disability check. He was feeling depressed about his circumstances this morning and told [facility] staff on-site at [facility] that he is feeling depressed and suicidal.  He reports he hasn't come out of his room for weeks, however he does report he found Jesus and is attending church "and praying."  Patient mentioned having suicidal thoughts this morning, however upon discussion of his belief about suicide, he laughed and joked, "You got me. I'm doing right now, and I'm going to heaven." Patient states he came out of his hotel room and "people are willing to help when I ask."  He shares the [facility] staff are working on other shelter options today.  He is able to contract for safety and states he will go to "[facility], main office and see if they can help.  If they can't help me by Friday?  I can't be back on the streets, I'll be depressed and I'll come right back here."  Current depressive episode appears situational and he is willing to take action by going to [facility] to speak to case management. ? ?How Long Has This Been Causing You Problems? <Week ? ?What Do You Feel Would Help You the Most Today? Treatment for Depression or other mood problem ? ? ?Have You Recently Had Any Thoughts About Hurting Yourself? Yes ? ?Are You Planning to Commit Suicide/Harm Yourself At This time? No ? ? ?Have you Recently Had Thoughts About [place]? No ? ?Are You Planning to Harm Someone at This Time? No ? ?Explanation: No data recorded ? ?Have You Used Any Alcohol or Drugs in the Past 24 Hours? No ? ?How Long Ago Did You Use Drugs or Alcohol? No data recorded ?What Did You Use and How Much? No data recorded ? ?Do You Currently Have a Therapist/Psychiatrist? No ? ?Name of Therapist/Psychiatrist: No data recorded ? ?Have You Been Recently Discharged From Any Office Practice or Programs? No ? ?Explanation of Discharge From Practice/Program: No data recorded ? ?  ?CCA Screening Triage Referral Assessment ?Type of Contact:  Face-to-Face ? ?Telemedicine Service Delivery:   ?Is this Initial or Reassessment? No data recorded ?Date Telepsych consult ordered in [facility]:  No data recorded ?Time Telepsych consult ordered in [facility]:  No data recorded ?Location of Assessment: [facility] Assessment Services ? ?Provider Location: [facility] Assessment Services ? ? ?Collateral Involvement: N/A ? ? ?Does Patient Have a Stage manager Guardian? No data recorded ?Name and Contact of Legal Guardian: No data recorded ?If Minor and Not Living with Parent(s), Who has Custody? No data recorded ?Is CPS involved or ever been involved? Never ? ?Is APS involved or ever been involved? Never ? ? ?Patient Determined To Be At Risk for Harm To Self or Others Based on Review of Patient Reported Information or Presenting Complaint? No ? ?Method: No data recorded ?Availability of Means: No data recorded ?Intent: No data recorded ?Notification Required: No data recorded ?Additional Information for Danger  to Others Potential: No data recorded ?Additional Comments for Danger to Others Potential: No data recorded ?Are There Guns or Other Weapons in [place]? No data recorded ?Types of Guns/Weapons: No data recorded ?Are These Weapons Safely Secured?                            No data recorded ?Who Could Verify You Are Able To Have These Secured: No data recorded ?Do You Have any Outstanding Charges, Pending Court Dates, Parole/Probation? No data recorded ?Contacted To Inform of Risk of Harm To Self or Others: No data recorded ? ? ?Does Patient Present under Involuntary Commitment? No ? ?IVC Papers Initial File Date: No data recorded ? ?[place] of Residence: Kathleen Argue ? ? ?Patient Currently Receiving the Following Services: Not Receiving Services ? ? ?Determination of Need: Urgent (48 hours) ? ? ?Options For Referral: Outpatient Therapy ? ? ? ? ?CCA Biopsychosocial ?Patient Reported Schizophrenia/Schizoaffective Diagnosis in Past: No ? ? ?Strengths: Resourceful, motivated towards  treatment ? ? ?Mental Health Symptoms ?Depression:   ?Difficulty Concentrating; Hopelessness ?  ?Duration of Depressive symptoms:  ?Duration of Depressive Symptoms: Less than two weeks ?  ?Mania:   ?None

## 2021-07-02 NOTE — Progress Notes (Signed)
?   07/02/21 1150  ?[place] Triage Screening (Walk-ins at [facility] only)  ?How Did You Hear About [place]? Self  ?What Is the Reason for Your Visit/Call Today? Patient presents via [employer] voluntarily from the [facility] (for the homeless).  He has been there several months and he is at the end of his allotted time this week. Patient states he had planned to stay in his [place], however he owes "this guy" $250 by the end of the week to keep his [employer].  This is not a valid loan, and patient feels "he will take my [employer] Lei and I'll be homeless again, on the streets Friday."  Patient has pleaded with the man to give him until Friday when he gets his disability check. He was feeling depressed about his circumstances this morning and told [facility] staff on-site at [facility] that he is feeling depressed and suicidal.  He reports he hasn't come out of his room for weeks, however he does report he found Jesus and is attending church "and praying."  Patient mentioned having suicidal thoughts this morning, however upon discussion of his belief about suicide, he laughed and joked, "You got me. I'm doing right now, and I'm going to heaven." Patient states he came out of his hotel room and "people are willing to help when I ask."  He shares the [facility] staff are working on other shelter options today.  He is able to contract for safety and states he will go to "[facility], main office and see if they can help.  If they can't help me by Friday?  I can't be back on the streets, I'll be depressed and I'll come right back here."  Current depressive episode appears situational and he is willing to take action by going to [facility] to speak to case management.  ?How Long Has This Been Causing You Problems? <Week  ?Have You Recently Had Any Thoughts About Hurting Yourself? Yes  ?How long ago did you have thoughts about hurting yourself? passive SI today - no plan and contracts for safety  ?Are You Planning to Commit Suicide/Harm Yourself At This time? No  ?Have you Recently  Had Thoughts About [place]? No  ?Are You Planning To Harm Someone At This Time? No  ?Are you currently experiencing any auditory, visual or other hallucinations? No  ?Have You Used Any Alcohol or Drugs in the Past 24 Hours? No  ?Do you have any current medical co-morbidities that require immediate attention? No  ?Clinician description of patient physical appearance/behavior: Patient is disheveled, malodorous, states he hasn't showered in weeks.  He is calm, cooperative [id number]  ?What Do You Feel Would Help You the Most Today? Treatment for Depression or other mood problem  ?If access to [facility] Urgent Care was not available, would you have sought care in the Emergency Department? No  ?Determination of Need Urgent (48 hours)  ?Options For Referral Outpatient Therapy  ? ? ?

## 2021-07-02 NOTE — ED Provider Notes (Signed)
Behavioral Health Urgent Care Medical Screening Exam ? ?Patient Name: David Wyatt ?MRN: [id number] ?Date of Evaluation: 07/02/21 ?Chief Complaint:   ?Diagnosis:  ?Final diagnoses:  ?None  ? ? ?History of Present illness: David Wyatt is a 61 y.o. male.  Presents to [name] urgent care accompanied by [name] with a a requesting someone to talk someone.  He reports passing of his mother a year ago sometimes he just needs someone to talk to.  States he has been [place], stated that recently he found out that he is going to have to move to a new location and is physically unable to lift his belongings. He reported that  "time is running out"and I will have to move from his current location. ? ?[employer] stated that patient was requesting to talk to somebody due to his "thoughts of ending it all". ? ?David Wyatt was seen and evaluated where he is requesting food to eat.  States he needs therapy and psychiatry services. Counselor reported that suicidal statements were made for secondary gain for housing and food.  Case management to provide transportation to [name] for additional outpatient resources.  Chart review patient is connected with integrative behavioral services currently. ? ?During evaluation David Wyatt is sitting in no acute distress.  He is alert/oriented x 4; calm/cooperative; and mood congruent with affect.  He is speaking in a clear tone at moderate volume, and normal pace; with good eye contact.  His thought process is coherent and relevant; There is no indication that he is currently responding to internal/external stimuli or experiencing delusional thought content; and he has denied suicidal/self-harm/homicidal ideation, psychosis, and paranoia.  Patient has remained calm throughout assessment and has answered questions appropriately.   ? ?At this time David Wyatt is educated and verbalizes understanding of mental health resources and  other crisis services in the community. He is instructed to call 911 and present to the nearest emergency room should he experience any suicidal/homicidal ideation, auditory/visual/hallucinations, or detrimental worsening of his mental health condition.  He was a also advised by Probation officer that he could call the toll-free phone on insurance card to assist with identifying in network counselors and agencies or number on back of Medicaid card to speak with care coordinator.  ?  ? ? ? ? ? ?Psychiatric Specialty Exam ? ?Presentation  ?General Appearance:Appropriate for Environment ? ?Eye Contact:Good ? ?Speech:Clear and Coherent ? ?Speech Volume:Normal ? ?Handedness:Right ? ? ?Mood and Affect  ?Mood:Anxious; Depressed ? ?Affect:Congruent ? ? ?Thought Process  ?Thought Processes:Coherent ? ?Descriptions of Associations:Intact ? ?Orientation:Full (Time, Place and Person) ? ?Thought Content:Logical ?   Hallucinations:None ? ?Ideas of Reference:None ? ?Suicidal Thoughts:Yes, Passive ?With Intent; With Means to Carry Out ? ?Homicidal Thoughts:No ? ? ?Sensorium  ?Memory:Immediate Good; Remote Good ? ?Judgment:Fair ? ?Insight:Fair ? ? ?Executive Functions  ?Concentration:Good ? ?Attention Span:Good ? ?Recall:Fair ? ?[place] ? ?Language:Fair ? ? ?Psychomotor Activity  ?Psychomotor Activity:Normal ? ? ?Assets  ?Assets:Housing; Social Support; Resilience ? ? ?Sleep  ?Sleep:Fair ? ?Number of hours: No data recorded ? ?Nutritional Assessment (For OBS and FBC admissions only) ?Has the patient had a weight loss or gain of 10 pounds or more in the last 3 months?: No ?Has the patient had a decrease in food intake/or appetite?: No ?Does the patient have dental problems?: No ?Does the patient have eating habits or behaviors that may be indicators of an eating disorder including binging or inducing vomiting?: No ?Has the  patient recently lost weight without trying?: 0 ?Has the patient been eating poorly because of a decreased  appetite?: 0 ?Malnutrition Screening Tool Score: 0 ? ? ? ?Physical Exam: ?Physical Exam ?Vitals reviewed.  ?Cardiovascular:  ?   Rate and Rhythm: Normal rate and regular rhythm.  ?Psychiatric:     ?   Attention and Perception: Attention and perception normal.     ?   Mood and Affect: Mood is depressed.     ?   Speech: Speech normal.     ?   Behavior: Behavior normal.     ?   Thought Content: Thought content normal.     ?   Cognition and Memory: Cognition and memory normal.     ?   Judgment: Judgment normal.  ? ?Review of Systems  ?HENT: Negative.    ?Cardiovascular: Negative.   ?Genitourinary: Negative.   ?Psychiatric/Behavioral:  Positive for depression. Negative for suicidal ideas. The patient is nervous/anxious.   ?All other systems reviewed and are negative. ?Blood pressure 124/84, pulse 93, temperature 98.4 ?F (36.9 ?C), temperature source Oral, resp. rate 18, SpO2 97 %. There is no height or weight on file to calculate BMI. ? ?Musculoskeletal: ?Strength & Muscle Tone: within normal limits ?Gait & Station: normal ?Patient leans: N/A ? ? ?[facility] MSE Discharge Disposition for Follow up and Recommendations: ?Based on my evaluation the patient does not appear to have an emergency medical condition and can be discharged with resources and follow up care in outpatient services for Medication Management and Partial Hospitalization Program ? ? ?Derrill Center, NP ?07/02/2021, 11:50 AM ? ?

## 2021-07-02 NOTE — Discharge Summary (Signed)
[name] to be D/C'd Home per NP order. Discussed with the patient and all questions fully answered. An After Visit Summary was printed and given to the patient. Patient escorted out and D/C home via safe transport.  ?Georg Ang  Kathlen Brunswick  ?07/02/2021 12:31 PM ?  ?   ?

## 2021-07-04 ENCOUNTER — Other Ambulatory Visit: Payer: Self-pay | Admitting: Physician Assistant

## 2021-07-04 ENCOUNTER — Encounter: Payer: Self-pay | Admitting: Physician Assistant

## 2021-07-04 ENCOUNTER — Emergency Department (HOSPITAL_COMMUNITY)
Admission: EM | Admit: 2021-07-04 | Discharge: 2021-07-04 | Disposition: A | Discharge status: Home / Self Care | Payer: Medicaid Other | Attending: Emergency Medicine | Admitting: Emergency Medicine

## 2021-07-04 ENCOUNTER — Other Ambulatory Visit (HOSPITAL_COMMUNITY): Payer: Self-pay

## 2021-07-04 ENCOUNTER — Encounter (HOSPITAL_COMMUNITY): Payer: Self-pay

## 2021-07-04 DIAGNOSIS — R319 Hematuria, unspecified: Secondary | ICD-10-CM | POA: Diagnosis not present

## 2021-07-04 DIAGNOSIS — D75839 Thrombocytosis, unspecified: Secondary | ICD-10-CM | POA: Diagnosis not present

## 2021-07-04 DIAGNOSIS — I10 Essential (primary) hypertension: Secondary | ICD-10-CM | POA: Diagnosis not present

## 2021-07-04 DIAGNOSIS — R739 Hyperglycemia, unspecified: Secondary | ICD-10-CM | POA: Diagnosis present

## 2021-07-04 DIAGNOSIS — E1165 Type 2 diabetes mellitus with hyperglycemia: Secondary | ICD-10-CM | POA: Diagnosis not present

## 2021-07-04 DIAGNOSIS — Z7984 Long term (current) use of oral hypoglycemic drugs: Secondary | ICD-10-CM | POA: Diagnosis not present

## 2021-07-04 DIAGNOSIS — Z79899 Other long term (current) drug therapy: Secondary | ICD-10-CM | POA: Insufficient documentation

## 2021-07-04 LAB — CBC WITH DIFFERENTIAL/PLATELET
Abs Immature Granulocytes: 0.12 10*3/uL — ABNORMAL HIGH (ref 0.00–0.07)
Basophils Absolute: 0.1 10*3/uL (ref 0.0–0.1)
Basophils Relative: 1 %
Eosinophils Absolute: 0.3 10*3/uL (ref 0.0–0.5)
Eosinophils Relative: 3 %
HCT: 39.6 % (ref 39.0–52.0)
Hemoglobin: 13.8 g/dL (ref 13.0–17.0)
Immature Granulocytes: 1 %
Lymphocytes Relative: 23 %
Lymphs Abs: 2.3 10*3/uL (ref 0.7–4.0)
MCH: 32.4 pg (ref 26.0–34.0)
MCHC: 34.8 g/dL (ref 30.0–36.0)
MCV: 93 fL (ref 80.0–100.0)
Monocytes Absolute: 0.6 10*3/uL (ref 0.1–1.0)
Monocytes Relative: 6 %
Neutro Abs: 6.5 10*3/uL (ref 1.7–7.7)
Neutrophils Relative %: 66 %
Platelets: 421 10*3/uL — ABNORMAL HIGH (ref 150–400)
RBC: 4.26 MIL/uL (ref 4.22–5.81)
RDW: 12 % (ref 11.5–15.5)
WBC: 10.1 10*3/uL (ref 4.0–10.5)
nRBC: 0 % (ref 0.0–0.2)

## 2021-07-04 LAB — BLOOD GAS, VENOUS
Acid-Base Excess: 5.8 mmol/L — ABNORMAL HIGH (ref 0.0–2.0)
Bicarbonate: 31.2 mmol/L — ABNORMAL HIGH (ref 20.0–28.0)
O2 Saturation: 95.7 %
Patient temperature: 37
pCO2, Ven: 47 mmHg (ref 44–60)
pH, Ven: 7.43 (ref 7.25–7.43)
pO2, Ven: 72 mmHg — ABNORMAL HIGH (ref 32–45)

## 2021-07-04 LAB — COMPREHENSIVE METABOLIC PANEL
ALT: 31 U/L (ref 0–44)
AST: 20 U/L (ref 15–41)
Albumin: 3.8 g/dL (ref 3.5–5.0)
Alkaline Phosphatase: 123 U/L (ref 38–126)
Anion gap: 10 (ref 5–15)
BUN: 31 mg/dL — ABNORMAL HIGH (ref 8–23)
CO2: 26 mmol/L (ref 22–32)
Calcium: 9.8 mg/dL (ref 8.9–10.3)
Chloride: 95 mmol/L — ABNORMAL LOW (ref 98–111)
Creatinine, Ser: 1.07 mg/dL (ref 0.61–1.24)
GFR, Estimated: >60 mL/min (ref >60)
Glucose, Bld: 585 mg/dL (ref 70–99)
Potassium: 4.3 mmol/L (ref 3.5–5.1)
Sodium: 131 mmol/L — ABNORMAL LOW (ref 135–145)
Total Bilirubin: 0.7 mg/dL (ref 0.3–1.2)
Total Protein: 8 g/dL (ref 6.5–8.1)

## 2021-07-04 LAB — URINALYSIS, ROUTINE W REFLEX MICROSCOPIC
Bacteria, UA: NONE SEEN
Bilirubin Urine: NEGATIVE
Glucose, UA: >=500 mg/dL — AB
Ketones, ur: NEGATIVE mg/dL
Leukocytes,Ua: NEGATIVE
Nitrite: NEGATIVE
Protein, ur: NEGATIVE mg/dL
Specific Gravity, Urine: 1.024 (ref 1.005–1.030)
pH: 6 (ref 5.0–8.0)

## 2021-07-04 LAB — CBG MONITORING, ED
Glucose-Capillary: >600 mg/dL (ref 70–99)
Glucose-Capillary: 336 mg/dL — ABNORMAL HIGH (ref 70–99)

## 2021-07-04 MED ORDER — METFORMIN HCL 500 MG PO TABS
1000.0000 mg | ORAL_TABLET | Freq: Once | ORAL | Status: AC
  Administered 2021-07-04: 1000 mg via ORAL
  Filled 2021-07-04: qty 2

## 2021-07-04 MED ORDER — ACCU-CHEK GUIDE VI STRP
ORAL_STRIP | 0 refills | Status: DC
  Filled 2021-07-04 – 2021-07-05 (×2): qty 50, 12d supply, fill #0

## 2021-07-04 MED ORDER — INSULIN ASPART 100 UNIT/ML IJ SOLN
8.0000 [IU] | Freq: Once | INTRAMUSCULAR | Status: AC
Start: 2021-07-04 — End: 2021-07-04
  Administered 2021-07-04: 8 [IU] via INTRAVENOUS
  Filled 2021-07-04: qty 0.08

## 2021-07-04 MED ORDER — ACCU-CHEK SOFTCLIX LANCETS MISC
0 refills | Status: DC
  Filled 2021-07-04 – 2021-07-05 (×2): qty 100, 25d supply, fill #0

## 2021-07-04 MED ORDER — BLOOD GLUCOSE MONITOR SYSTEM W/DEVICE KIT
PACK | 0 refills | Status: DC
Start: 2021-07-04 — End: 2021-07-05
  Filled 2021-07-04: qty 1, 1d supply, fill #0
  Filled 2021-07-05: qty 1, 30d supply, fill #0

## 2021-07-04 MED ORDER — INSULIN GLARGINE 100 UNIT/ML SOLOSTAR PEN
12.0000 [IU] | PEN_INJECTOR | Freq: Every morning | SUBCUTANEOUS | 11 refills | Status: DC
  Filled 2021-07-04: qty 3, 25d supply, fill #0

## 2021-07-04 MED ORDER — CARBAMIDE PEROXIDE 6.5 % OT SOLN
5.0000 [drp] | Freq: Two times a day (BID) | OTIC | 2 refills | Status: DC
  Filled 2021-07-04: qty 15, 30d supply, fill #0

## 2021-07-04 MED ORDER — LACTATED RINGERS IV BOLUS
2000.0000 mL | Freq: Once | INTRAVENOUS | Status: AC
  Administered 2021-07-04: 2000 mL via INTRAVENOUS

## 2021-07-04 NOTE — ED Provider Notes (Signed)
?[place] DEPT ?Provider Note ? ? ?CSN: [id number] ?Arrival date & time: 07/04/21  0419 ? ?  ? ?History ? ?Chief Complaint  ?Patient presents with  ? Hyperglycemia  ? ? ?David Wyatt is a 61 y.o. male with a hx of T2DM, HTN, hyperlipidemia, and kidney stones who presents to the emergency department with complaints of hyperglycemia.  Patient states he was feeling thirsty with some increased urination over the past couple of days therefore he came to get checked out, with EMS his blood sugar was elevated in the 400s.  He states he has been taking his metformin and his injectable diabetes medication sometimes but not necessarily consistently.  He reports that he does have a stent in for a kidney stone, he was previously having problems with hematuria and dysuria, however these resolved a while ago, denies dysuria or hematuria or flank pain or abdominal pain at present.  He reports some continued urinary frequency.  He denies fever, chills, nausea, vomiting, abdominal pain, cough, dyspnea, chest pain, or syncope. ?HPI ? ?  ? ?Home Medications ?Prior to Admission medications   ?Medication Sig Start Date End Date Taking? Authorizing Provider  ?Accu-Chek Softclix Lancets lancets Test blood sugar 2 times daily 03/22/21   Mayers, Cari S, PA-C  ?amLODipine (NORVASC) 10 MG tablet Take 1 tablet (10 mg total) by mouth daily. 03/22/21   Mayers, Cari S, PA-C  ?atorvastatin (LIPITOR) 40 MG tablet Take 1 tablet (40 mg total) by mouth daily. 03/22/21   Mayers, Loraine Grip, PA-C  ?Blood Glucose Monitoring Suppl (ACCU-CHEK GUIDE) w/Device KIT USE AS DIRECTED TO CHECK BLOOD SUGARS UP TO TWICE PER DAY ?Patient taking differently: 1 each by Other route 2 (two) times daily. 02/17/19   [name], MD  ?Blood Glucose Monitoring Suppl (ACCU-CHEK GUIDE) w/Device KIT Use as directed ?Patient taking differently: 1 each by Other route 2 (two) times daily. 11/03/20   Elsie Stain, MD  ?cyclobenzaprine (FLEXERIL) 10  MG tablet Take 1 tablet (10 mg total) by mouth 2 (two) times daily as needed for muscle spasms. 12/26/20   [name], MD  ?DULoxetine (CYMBALTA) 60 MG capsule Take 1 capsule (60 mg total) by mouth daily. 03/22/21   Mayers, Cari S, PA-C  ?empagliflozin (JARDIANCE) 10 MG TABS tablet Take 1 tablet (10 mg total) by mouth daily before breakfast. ?Patient not taking: Reported on 06/23/2021 03/22/21   Mayers, Loraine Grip, PA-C  ?gabapentin (NEURONTIN) 300 MG capsule Take 2 capsules (600 mg total) by mouth 3 (three) times daily. 03/22/21   Mayers, Cari S, PA-C  ?glipiZIDE (GLUCOTROL XL) 10 MG 24 hr tablet Take 1 tablet (10 mg total) by mouth daily with breakfast. 03/22/21   Mayers, Cari S, PA-C  ?glucose blood test strip Test twice daily 03/22/21   Mayers, Cari S, PA-C  ?hydrOXYzine (ATARAX) 25 MG tablet Take 1 tablet (25 mg total) by mouth at bedtime. 05/23/21   [name], MD  ?metFORMIN (GLUCOPHAGE) 1000 MG tablet Take 1 tablet (1,000 mg total) by mouth 2 (two) times daily. 03/22/21   Mayers, Cari S, PA-C  ?ondansetron (ZOFRAN) 4 MG tablet Take 1 tablet (4 mg total) by mouth every 6 (six) hours. 03/22/21   Mayers, Cari S, PA-C  ?loratadine (CLARITIN) 10 MG tablet Take 1 tablet (10 mg total) by mouth daily. ?Patient not taking: No sig reported 06/03/17 07/20/20  [name], MD  ?   ? ?Allergies    ?Lisinopril   ? ?Review of Systems   ?  Review of Systems  ?Constitutional:  Negative for chills and fever.  ?Respiratory:  Negative for cough and shortness of breath.   ?Cardiovascular:  Negative for chest pain.  ?Gastrointestinal:  Negative for abdominal pain, nausea and vomiting.  ?Endocrine: Positive for polydipsia and polyuria.  ?Genitourinary:  Positive for frequency. Negative for flank pain.  ?Neurological:  Negative for syncope.  ?All other systems reviewed and are negative. ? ?Physical Exam ?Updated Vital Signs ?BP (!) 145/100 (BP Location: Right Arm)   Pulse 82   Temp 97.9 ?F (36.6 ?C) (Oral)   Resp 17   Ht 5' 8"  (1.727 m)   Wt 74.8 kg   SpO2 96%   BMI 25.09 kg/m?  ?Physical Exam ?Vitals and nursing note reviewed.  ?Constitutional:   ?   General: He is not in acute distress. ?   Appearance: He is well-developed. He is not toxic-appearing.  ?HENT:  ?   Head: Normocephalic and atraumatic.  ?   Mouth/Throat:  ?   Mouth: Mucous membranes are dry.  ?Eyes:  ?   General:     ?   Right eye: No discharge.     ?   Left eye: No discharge.  ?   Conjunctiva/sclera: Conjunctivae normal.  ?Cardiovascular:  ?   Rate and Rhythm: Normal rate and regular rhythm.  ?Pulmonary:  ?   Effort: No respiratory distress.  ?   Breath sounds: Normal breath sounds. No wheezing or rales.  ?Abdominal:  ?   General: There is no distension.  ?   Palpations: Abdomen is soft.  ?   Tenderness: There is no abdominal tenderness. There is no guarding or rebound.  ?Musculoskeletal:  ?   Cervical back: Neck supple.  ?Skin: ?   General: Skin is warm and dry.  ?Neurological:  ?   Mental Status: He is alert.  ?   Comments: Clear speech.   ?Psychiatric:     ?   Behavior: Behavior normal.  ? ? ?ED Results / Procedures / Treatments   ?Labs ?(all labs ordered are listed, but only abnormal results are displayed) ?Labs Reviewed  ?CBG MONITORING, ED - Abnormal; Notable for the following components:  ?    Result Value  ? Glucose-Capillary >600 (*)   ? All other components within normal limits  ?CBG MONITORING, ED - Abnormal; Notable for the following components:  ? Glucose-Capillary 517 (*)   ? All other components within normal limits  ?COMPREHENSIVE METABOLIC PANEL  ?CBC WITH DIFFERENTIAL/PLATELET  ?URINALYSIS, ROUTINE W REFLEX MICROSCOPIC  ?BLOOD GAS, VENOUS  ? ? ?EKG ?None ? ?Radiology ?No results found. ? ?Procedures ?Procedures  ? ? ?Medications Ordered in ED ?Medications  ?lactated ringers bolus 2,000 mL (2,000 mLs Intravenous New Bag/Given 07/04/21 0446)  ? ? ?ED Course/ Medical Decision Making/ A&P ?  ?                        ?Medical Decision Making ?Amount and/or  Complexity of Data Reviewed ?Labs: ordered. ? ?Risk ?Prescription drug management. ? ?Patient presents to the ED with complaints of hyperglycemia, this involves an extensive number of treatment options, and is a complaint that carries with it a high risk of complications and morbidity. Nontoxic, vitals with hypertension, low suspension for hypertensive emergency.  Patient's mucous membranes are extremely dry, exam is otherwise fairly unremarkable..  ? ?Additional history obtained:  ?Chart & nursing note reviewed.  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   ? ?Lab Tests:  ?I viewed & interpreted labs including:  ?CBG: >600, 517 ?CBC: mildly elevated plt count ?CMP: Hyperglycemia with an elevated BUN, no acidosis or anion gap elevation ?Urinalysis: Hematuria, no obvious infection.  Glucosuria, however no ketonuria. ? ?ED Course:  ?Patient with findings of hyperglycemia and suspected degree of dehydration.  Presentation and laboratory testing not consistent with DKA/HHS at this time.  No critical electrolyte derangement otherwise.  Urine analysis with hematuria, still has stent in place, no obvious infection at this time.  Patient given IV fluids as well as a dose of insulin in the emergency department with some improvement in his blood sugar as well as his symptoms.  He is feeling better. He reports he does not need medication refills at this time-  I discussed the importance of taking his diabetes medications as prescribed. I discussed results, treatment plan, need for follow-up, and return precautions with the patient. Provided opportunity for questions, patient confirmed understanding and is in agreement with plan.  ? ? ?Based on patient's chief complaint, I considered admission might be  necessary, however after reassuring ED workup feel patient is reasonable for discharge.  ?Portions of this note were generated with [employer]. Dictation errors may occur despite best attempts at proofr

## 2021-07-04 NOTE — Progress Notes (Addendum)
Seen at [facility] ?61 yo with hx depression, DM, GERD, crack cocaine use seen in the ED this morning for hyperglycemia.  He notes SI on Monday.  Looks like he was seen in ED for this and cleared for discharge, he's currently denying SI.  He notes suboptimal compliance with meds.  Has R shoulder pain and stiffness, he says rotator cuff issues.  Medications reviewed in his bag. ? ?VS BP 152/97, 92, 97% RA ?NAD ?RRR ?Unlabored ?No LEE ? ?AP ?DM: A1c >15.  Start lantus 12 units.  Continue metformin.  Continue gabapentin (he has plenty of this).  He didn't have jardiance or glipizide, will not restart these now. ?HTN: BP up, hasn't taken meds today,  Restart amlodipine (has this in bag). ?Dyslipidemia: lipitor ?Depression/Chronic Pain: cymbalta ?Hard of hearing: cerumen impaction, debrox drops ?R shoulder pain: voltaren ?Helen to help with pill box ? ?Fayrene Helper, MD ? ?------------------------------------------- ? ? ?Pt seen by Dr Florene Glen. ? ?ER visit 04/26 for elevated CBG, 517 at one point. D/c after IVF and IV insulin ? ?He was admitted to [facility], d/c 07/02/2021 ? ?Sunday, he was ready to commit suicide. He is not sure why he didn't, but went to the social worker on Monday morning. He got some help and feels a little better now.  ? ?He gets disability, $900/month, but cannot find somewhere to stay.  ? ?He is afraid he will die.  ? ?He prays, but is still depressed, feels he is not getting anywhere.  ? ?He forgets to take his meds, Bonnita Nasuti will do a pillbox. ? ?152/97, 92, 97%  148.4 lbs. ? ?Ears are stopped up and has trouble hearing per his report, both ears are completely stopped up by wax. Debrox ordered ? ?He does not have a meter, no insulin. We will restart Lantus at 12 u/day and get him a meter.  ? ?Has R shoulder pain, was given Voltaren get.  ? ? ?Drugs of Abuse  ?   ?Component Value Date/Time  ? [place] DETECTED 02/17/2021 1301  ? COCAINSCRNUR POSITIVE (A) 02/17/2021  1301  ? [place] DETECTED 02/17/2021 1301  ? AMPHETMU NONE DETECTED 02/17/2021 1301  ? THCU NONE DETECTED 02/17/2021 1301  ? LABBARB NONE DETECTED 02/17/2021 1301  ?  ?Rosaria Ferries, PA-C ?07/04/2021 ?4:28 PM ? ? ? ?

## 2021-07-04 NOTE — Discharge Instructions (Addendum)
You were seen in the emergency department today for high blood sugar.  This improved with fluids and insulin however was still elevated in the 330s, we gave you your morning dose of metformin in the emergency department.  Please be sure to monitor your blood sugar at home and to take all of your diabetes medications as prescribed.  Follow-up with primary care soon as possible.  Please also follow-up with outpatient urology in regards to your stent.  Return to the emergency department for new or worsening symptoms including but not limited to new or worsening pain, inability to keep fluids down, fever, trouble breathing, passing out, or any other concerns.  ?

## 2021-07-04 NOTE — ED Triage Notes (Signed)
CBG with EMS 459 ?

## 2021-07-04 NOTE — ED Triage Notes (Signed)
Pt BIB [facility] for increased urination the last several days. Has not had DM medication for a few days before last night. Endorses pain with urination and some hematuria but was supposed to have kidney surgery Monday. Denies pain. ? ?152/108 ?HR 102 ?97% RA ?RR 16 ?

## 2021-07-05 ENCOUNTER — Other Ambulatory Visit: Payer: Self-pay

## 2021-07-05 ENCOUNTER — Other Ambulatory Visit: Payer: Self-pay | Admitting: Pharmacist

## 2021-07-05 ENCOUNTER — Other Ambulatory Visit: Payer: Self-pay | Admitting: *Deleted

## 2021-07-05 ENCOUNTER — Telehealth: Payer: Self-pay

## 2021-07-05 ENCOUNTER — Other Ambulatory Visit (HOSPITAL_COMMUNITY): Payer: Self-pay

## 2021-07-05 ENCOUNTER — Other Ambulatory Visit: Payer: Self-pay | Admitting: Critical Care Medicine

## 2021-07-05 DIAGNOSIS — E1165 Type 2 diabetes mellitus with hyperglycemia: Secondary | ICD-10-CM

## 2021-07-05 DIAGNOSIS — I10 Essential (primary) hypertension: Secondary | ICD-10-CM

## 2021-07-05 DIAGNOSIS — G4709 Other insomnia: Secondary | ICD-10-CM

## 2021-07-05 LAB — CBG MONITORING, ED: Glucose-Capillary: 517 mg/dL (ref 70–99)

## 2021-07-05 MED ORDER — ACCU-CHEK GUIDE VI STRP
ORAL_STRIP | 3 refills | Status: DC
  Filled 2021-07-05: qty 100, 33d supply, fill #0
  Filled 2021-07-05: qty 100, fill #0

## 2021-07-05 MED ORDER — HYDROXYZINE HCL 25 MG PO TABS
25.0000 mg | ORAL_TABLET | Freq: Every day | ORAL | 2 refills | Status: DC
  Filled 2021-07-05: qty 30, 30d supply, fill #0

## 2021-07-05 MED ORDER — INSULIN PEN NEEDLE 31G X 8 MM MISC
1 refills | Status: DC
Start: 2021-07-05 — End: 2021-10-04
  Filled 2021-07-05 (×2): qty 100, 34d supply, fill #0

## 2021-07-05 MED ORDER — INSULIN GLARGINE SOLOSTAR 100 UNIT/ML ~~LOC~~ SOPN
12.0000 [IU] | PEN_INJECTOR | Freq: Every day | SUBCUTANEOUS | 2 refills | Status: DC
  Filled 2021-07-05: qty 3, 25d supply, fill #0

## 2021-07-05 MED ORDER — INSULIN GLARGINE SOLOSTAR 100 UNIT/ML ~~LOC~~ SOPN
12.0000 [IU] | PEN_INJECTOR | Freq: Every day | SUBCUTANEOUS | 2 refills | Status: DC
  Filled 2021-07-05: qty 15, 125d supply, fill #0

## 2021-07-05 MED ORDER — BASAGLAR KWIKPEN 100 UNIT/ML ~~LOC~~ SOPN
12.0000 [IU] | PEN_INJECTOR | Freq: Every day | SUBCUTANEOUS | 3 refills | Status: DC
  Filled 2021-07-05: qty 6, 50d supply, fill #0
  Filled 2021-07-05: qty 3, 25d supply, fill #0

## 2021-07-05 MED ORDER — AMLODIPINE BESYLATE 10 MG PO TABS
10.0000 mg | ORAL_TABLET | Freq: Every day | ORAL | 6 refills | Status: DC
  Filled 2021-07-05: qty 30, 30d supply, fill #0

## 2021-07-05 MED ORDER — ACCU-CHEK GUIDE W/DEVICE KIT
PACK | 0 refills | Status: DC
  Filled 2021-07-05: qty 1, fill #0
  Filled 2021-07-05: qty 1, 30d supply, fill #0

## 2021-07-05 MED ORDER — METFORMIN HCL 1000 MG PO TABS
1000.0000 mg | ORAL_TABLET | Freq: Two times a day (BID) | ORAL | 6 refills | Status: DC
  Filled 2021-07-05: qty 60, 30d supply, fill #0

## 2021-07-05 NOTE — Telephone Encounter (Signed)
Dr Margarita Rana spoke to me about the patient's need to get his medications and a glucometer. He had 2 visits to ED in the past 4 days.  ?The patient is currently at [facility].  ? ?I spoke to [employer], [facility] and he had patient's medications transferred to [facility].  He clarified the insulin orders and also ordered a new glucometer for patient. He said they will be ready for pick up today.  ? ?I spoke to Freddy Finner, [place] and informed her that the medications and glucometer will be ready for pick up at the pharmacy today and she said she will pick them up for patient.  We also discussed the possibility of ALF. The patient has been very resistant but she will talk to him again about it as he really needs the assistance with managing his medical care and medications. She said that he left [facility] because he did not feel safe there.  ? ?I also discussed the option of ALF with Billie Ruddy, [place] and informed her that we can help with FL2 and contacting a facility if needed.  ?

## 2021-07-06 ENCOUNTER — Encounter (HOSPITAL_COMMUNITY): Payer: Self-pay

## 2021-07-06 ENCOUNTER — Other Ambulatory Visit: Payer: Self-pay

## 2021-07-06 ENCOUNTER — Emergency Department (HOSPITAL_COMMUNITY)
Admission: EM | Admit: 2021-07-06 | Discharge: 2021-07-07 | Disposition: A | Discharge status: Home / Self Care | Payer: Medicaid Other | Attending: Emergency Medicine | Admitting: Emergency Medicine

## 2021-07-06 DIAGNOSIS — E1165 Type 2 diabetes mellitus with hyperglycemia: Secondary | ICD-10-CM | POA: Insufficient documentation

## 2021-07-06 DIAGNOSIS — Z716 Tobacco abuse counseling: Secondary | ICD-10-CM | POA: Insufficient documentation

## 2021-07-06 DIAGNOSIS — Z91198 Patient's noncompliance with other medical treatment and regimen for other reason: Secondary | ICD-10-CM | POA: Insufficient documentation

## 2021-07-06 DIAGNOSIS — I10 Essential (primary) hypertension: Secondary | ICD-10-CM | POA: Insufficient documentation

## 2021-07-06 DIAGNOSIS — E1140 Type 2 diabetes mellitus with diabetic neuropathy, unspecified: Secondary | ICD-10-CM | POA: Insufficient documentation

## 2021-07-06 DIAGNOSIS — Z79899 Other long term (current) drug therapy: Secondary | ICD-10-CM | POA: Diagnosis not present

## 2021-07-06 DIAGNOSIS — F17210 Nicotine dependence, cigarettes, uncomplicated: Secondary | ICD-10-CM | POA: Insufficient documentation

## 2021-07-06 DIAGNOSIS — Z794 Long term (current) use of insulin: Secondary | ICD-10-CM | POA: Insufficient documentation

## 2021-07-06 DIAGNOSIS — R350 Frequency of micturition: Secondary | ICD-10-CM | POA: Diagnosis present

## 2021-07-06 DIAGNOSIS — Z7984 Long term (current) use of oral hypoglycemic drugs: Secondary | ICD-10-CM | POA: Diagnosis not present

## 2021-07-06 DIAGNOSIS — R739 Hyperglycemia, unspecified: Secondary | ICD-10-CM

## 2021-07-06 LAB — CBC
HCT: 38.8 % — ABNORMAL LOW (ref 39.0–52.0)
Hemoglobin: 13 g/dL (ref 13.0–17.0)
MCH: 31.9 pg (ref 26.0–34.0)
MCHC: 33.5 g/dL (ref 30.0–36.0)
MCV: 95.1 fL (ref 80.0–100.0)
Platelets: 418 10*3/uL — ABNORMAL HIGH (ref 150–400)
RBC: 4.08 MIL/uL — ABNORMAL LOW (ref 4.22–5.81)
RDW: 12.2 % (ref 11.5–15.5)
WBC: 8.5 10*3/uL (ref 4.0–10.5)
nRBC: 0 % (ref 0.0–0.2)

## 2021-07-06 LAB — COMPREHENSIVE METABOLIC PANEL
ALT: 24 U/L (ref 0–44)
AST: 17 U/L (ref 15–41)
Albumin: 3.4 g/dL — ABNORMAL LOW (ref 3.5–5.0)
Alkaline Phosphatase: 102 U/L (ref 38–126)
Anion gap: 9 (ref 5–15)
BUN: 18 mg/dL (ref 8–23)
CO2: 26 mmol/L (ref 22–32)
Calcium: 9 mg/dL (ref 8.9–10.3)
Chloride: 100 mmol/L (ref 98–111)
Creatinine, Ser: 1.05 mg/dL (ref 0.61–1.24)
GFR, Estimated: >60 mL/min (ref >60)
Glucose, Bld: 488 mg/dL — ABNORMAL HIGH (ref 70–99)
Potassium: 4.1 mmol/L (ref 3.5–5.1)
Sodium: 135 mmol/L (ref 135–145)
Total Bilirubin: 0.5 mg/dL (ref 0.3–1.2)
Total Protein: 7.1 g/dL (ref 6.5–8.1)

## 2021-07-06 LAB — URINALYSIS, ROUTINE W REFLEX MICROSCOPIC
Bacteria, UA: NONE SEEN
Bilirubin Urine: NEGATIVE
Glucose, UA: >=500 mg/dL — AB
Ketones, ur: NEGATIVE mg/dL
Leukocytes,Ua: NEGATIVE
Nitrite: NEGATIVE
Protein, ur: NEGATIVE mg/dL
Specific Gravity, Urine: 1.026 (ref 1.005–1.030)
pH: 6 (ref 5.0–8.0)

## 2021-07-06 LAB — CBG MONITORING, ED
Glucose-Capillary: 228 mg/dL — ABNORMAL HIGH (ref 70–99)
Glucose-Capillary: 412 mg/dL — ABNORMAL HIGH (ref 70–99)
Glucose-Capillary: 585 mg/dL (ref 70–99)

## 2021-07-06 LAB — BLOOD GAS, VENOUS
Acid-Base Excess: 4.4 mmol/L — ABNORMAL HIGH (ref 0.0–2.0)
Bicarbonate: 29.2 mmol/L — ABNORMAL HIGH (ref 20.0–28.0)
O2 Saturation: 96 %
Patient temperature: 37
pCO2, Ven: 43 mmHg — ABNORMAL LOW (ref 44–60)
pH, Ven: 7.44 — ABNORMAL HIGH (ref 7.25–7.43)
pO2, Ven: 71 mmHg — ABNORMAL HIGH (ref 32–45)

## 2021-07-06 MED ORDER — LACTATED RINGERS IV BOLUS
1000.0000 mL | Freq: Once | INTRAVENOUS | Status: AC
Start: 2021-07-06 — End: 2021-07-06
  Administered 2021-07-06: 1000 mL via INTRAVENOUS

## 2021-07-06 MED ORDER — SODIUM CHLORIDE 0.9 % IV BOLUS
1000.0000 mL | Freq: Once | INTRAVENOUS | Status: AC
Start: 2021-07-06 — End: 2021-07-06
  Administered 2021-07-06: 1000 mL via INTRAVENOUS

## 2021-07-06 MED ORDER — INSULIN ASPART 100 UNIT/ML IJ SOLN
10.0000 [IU] | Freq: Once | INTRAMUSCULAR | Status: AC
Start: 2021-07-06 — End: 2021-07-06
  Administered 2021-07-06: 10 [IU] via SUBCUTANEOUS
  Filled 2021-07-06: qty 0.1

## 2021-07-06 NOTE — ED Provider Notes (Signed)
?  Physical Exam  ?BP (!) 136/92 (BP Location: Left Arm)   Pulse 75   Temp 98 ?F (36.7 ?C) (Oral)   Resp 13   Ht '5\' 8"'$  (1.727 m)   Wt 71.7 kg   SpO2 93%   BMI 24.02 kg/m?  ? ?Physical Exam ? ?Procedures  ?Procedures ? ?ED Course / MDM  ?  ?Medical Decision Making ?Amount and/or Complexity of Data Reviewed ?Labs: ordered. ? ?Risk ?Prescription drug management. ? ? ? ?Care of this patient assumed from preceding ED provider [place], PA-C at time of shift change.  Please see her associated note for further insight into the patient's ED course.  In brief patient is a homeless 61 year old male with history of type 2 diabetes, hypertension,hyperlipidemia who presents for the second visit in the last week for hyperglycemia.  He has not been taking his medications consistently which include glipizide, metformin, and Jardiance.  He presents with polyuria and polydipsia.  Currently residing [facility]. ? ?Patient was without DKA or HHS on laboratory studies but glucose was initially 488.  Improving to 412 after fluid bolus.  Plan is to continue to trend his blood sugars to less than or equal to 300 following administration of insulin and LR bolus.  Once blood sugars have improved patient may be discharged back to his shelter with instructions to resume his medications. ? ?Repeat CBG  under 300.  Patient reevaluated, feeling much improved.  Patient has a place to stay at [facility] and has a provider there who is dispensing his medications. ? ?No further work-up warranted near this time.  David Wyatt voiced understanding of his medical evaluation and treatment plan. Each of their questions answered to their expressed satisfaction.  Return precautions were given.  Patient is well-appearing, stable, and was discharged in good condition. ? ?This chart was dictated using voice recognition software, Dragon. Despite the best efforts of this provider to proofread and correct errors, errors may still occur which  can change documentation meaning. ? ?  ?Emeline Darling, PA-C ?07/07/21 0036 ? ?  ?Drenda Freeze, MD ?07/07/21 1450 ? ?

## 2021-07-06 NOTE — ED Provider Notes (Signed)
?[facility] DEPT ?Provider Note ? ? ?CSN: [id number] ?Arrival date & time: 07/06/21  0814 ? ?  ? ?History ? ?Chief Complaint  ?Patient presents with  ? Hyperglycemia  ? ? ?David Wyatt is a 61 y.o. male with a past medical history of hypertension, hyperlipidemia, nephrolithiasis and type 2 diabetes presenting today due to hyperglycemia.  He reports that for the past day he has been urinating "every 5 seconds" and having severely dry mouth.  He checked his blood sugar and it was 600 so he called 911.  Says that he has not taken his Jardiance, glipizide and metformin for at least 6 days and prior to that he takes them inconsistently.  Says he has not had his medication since he was discharged from the department 2 days ago either.  Denies any abdominal pain, nausea or vomiting.  No dysuria however endorsing urinary frequency.  Currently homeless however is staying at [facility]. ? ? ?Hyperglycemia ? ?  ? ?Home Medications ?Prior to Admission medications   ?Medication Sig Start Date End Date Taking? Authorizing Provider  ?Accu-Chek Softclix Lancets lancets Test blood sugar 2 times daily 03/22/21   Mayers, Loraine Grip, PA-C  ?Accu-Chek Softclix Lancets lancets Use up to 4 times daily as directed 07/04/21   Barrett, Evelene Croon, PA-C  ?amLODipine (NORVASC) 10 MG tablet Take 1 tablet (10 mg total) by mouth daily. 07/05/21   Elsie Stain, MD  ?atorvastatin (LIPITOR) 40 MG tablet Take 1 tablet (40 mg total) by mouth daily. 03/22/21   Mayers, Loraine Grip, PA-C  ?Blood Glucose Monitoring Suppl (ACCU-CHEK GUIDE) w/Device KIT USE AS DIRECTED TO CHECK BLOOD SUGARS UP TO TWICE PER DAY 07/05/21   [name], MD  ?carbamide peroxide (DEBROX) 6.5 % OTIC solution Place 5 drops into the right ear 2 (two) times daily. 07/04/21 08/03/22  Barrett, Evelene Croon, PA-C  ?cyclobenzaprine (FLEXERIL) 10 MG tablet Take 1 tablet (10 mg total) by mouth 2 (two) times daily as needed for muscle spasms. 12/26/20   [name], MD  ?DULoxetine (CYMBALTA) 60 MG capsule Take 1 capsule (60 mg total) by mouth daily. 03/22/21   Mayers, Cari S, PA-C  ?empagliflozin (JARDIANCE) 10 MG TABS tablet Take 1 tablet (10 mg total) by mouth daily before breakfast. 03/22/21   Mayers, Cari S, PA-C  ?gabapentin (NEURONTIN) 300 MG capsule Take 2 capsules (600 mg total) by mouth 3 (three) times daily. 03/22/21   Mayers, Cari S, PA-C  ?glipiZIDE (GLUCOTROL XL) 10 MG 24 hr tablet Take 1 tablet (10 mg total) by mouth daily with breakfast. 03/22/21   Mayers, Cari S, PA-C  ?glucose blood (ACCU-CHEK GUIDE) test strip Use upto 3 times daily as directed 07/05/21   [name], MD  ?hydrOXYzine (ATARAX) 25 MG tablet Take 1 tablet (25 mg total) by mouth at bedtime. 07/05/21   Elsie Stain, MD  ?Insulin Glargine Solostar (LANTUS) 100 UNIT/ML Solostar Pen Inject 12 Units into the skin daily. 07/05/21   Elsie Stain, MD  ?Insulin Pen Needle 31G X 8 MM MISC Use with insulin pen 07/05/21   Elsie Stain, MD  ?metFORMIN (GLUCOPHAGE) 1000 MG tablet Take 1 tablet (1,000 mg total) by mouth 2 (two) times daily. 07/05/21   Elsie Stain, MD  ?loratadine (CLARITIN) 10 MG tablet Take 1 tablet (10 mg total) by mouth daily. ?Patient not taking: No sig reported 06/03/17 07/20/20  [name], MD  ?   ? ?Allergies    ?Lisinopril   ? ?  Review of Systems   ?Review of Systems ? ?Physical Exam ?Updated Vital Signs ?BP (!) 136/92 (BP Location: Left Arm)   Pulse 75   Temp 98 ?F (36.7 ?C) (Oral)   Resp 13   Ht 5' 8" (1.727 m)   Wt 71.7 kg   SpO2 93%   BMI 24.02 kg/m?  ?Physical Exam ?Vitals and nursing note reviewed.  ?Constitutional:   ?   Appearance: Normal appearance.  ?HENT:  ?   Head: Normocephalic and atraumatic.  ?   Mouth/Throat:  ?   Mouth: Mucous membranes are dry.  ?Eyes:  ?   General: No scleral icterus. ?   Conjunctiva/sclera: Conjunctivae normal.  ?Cardiovascular:  ?   Rate and Rhythm: Normal rate and regular rhythm.  ?Pulmonary:  ?   Effort:  Pulmonary effort is normal. No respiratory distress.  ?Abdominal:  ?   General: Abdomen is flat.  ?   Palpations: Abdomen is soft.  ?   Tenderness: There is no abdominal tenderness.  ?Skin: ?   Findings: No rash.  ?Neurological:  ?   Mental Status: He is alert.  ?Psychiatric:     ?   Mood and Affect: Mood normal.  ? ? ?ED Results / Procedures / Treatments   ?Labs ?(all labs ordered are listed, but only abnormal results are displayed) ?Labs Reviewed  ?COMPREHENSIVE METABOLIC PANEL - Abnormal; Notable for the following components:  ?    Result Value  ? Glucose, Bld 488 (*)   ? Albumin 3.4 (*)   ? All other components within normal limits  ?CBC - Abnormal; Notable for the following components:  ? RBC 4.08 (*)   ? HCT 38.8 (*)   ? Platelets 418 (*)   ? All other components within normal limits  ?URINALYSIS, ROUTINE W REFLEX MICROSCOPIC - Abnormal; Notable for the following components:  ? Color, Urine STRAW (*)   ? Glucose, UA >=500 (*)   ? Hgb urine dipstick SMALL (*)   ? All other components within normal limits  ?BLOOD GAS, VENOUS - Abnormal; Notable for the following components:  ? pH, Ven 7.44 (*)   ? pCO2, Ven 43 (*)   ? pO2, Ven 71 (*)   ? Bicarbonate 29.2 (*)   ? Acid-Base Excess 4.4 (*)   ? All other components within normal limits  ?CBG MONITORING, ED - Abnormal; Notable for the following components:  ? Glucose-Capillary 585 (*)   ? All other components within normal limits  ?I-STAT VENOUS BLOOD GAS, ED  ?I-STAT VENOUS BLOOD GAS, ED  ? ? ?EKG ?None ? ?Radiology ?No results found. ? ?Procedures ?Procedures  ? ?Medications Ordered in ED ?Medications  ?sodium chloride 0.9 % bolus 1,000 mL (1,000 mLs Intravenous New Bag/Given 07/06/21 2020)  ?lactated ringers bolus 1,000 mL (1,000 mLs Intravenous New Bag/Given 07/06/21 2136)  ?insulin aspart (novoLOG) injection 10 Units (10 Units Subcutaneous Given 07/06/21 2137)  ? ? ?ED Course/ Medical Decision Making/ A&P ?  ?                        ?Medical Decision Making ?Amount  and/or Complexity of Data Reviewed ?Labs: ordered. ? ?Risk ?Prescription drug management. ? ? ?This patient presents to the ED for concern of hyperglycemia.  Blood sugar was 600 at home.  Differential includes DKA and HHS. ?  ?This is not an exhaustive differential.  ?  ?Past Medical History / Co-morbidities / Social History: ?Hypertension, hyperlipidemia and diabetes, homelessness ?  ?  Additional history: ?Additional history obtained from chart review.  Patient has had multiple visits to multiple offices due to uncontrolled type 2 diabetes. ?  ?Physical Exam: ?Physical exam performed. The pertinent findings include: Patient is chronically ill-appearing ? ?Lab Tests: ?I ordered, and personally interpreted labs.  The pertinent results include:  ?-POC glucose 585, CMP 488 ?-Normal anion gap ?-Venous blood gas with pH of 7.44, CO2 43 and bicarb of 29.2.  (2 days ago, pH is 7.43, CO2 47 and bicarb was 31.2) ?-No electrolyte abnormalities ? ?  ?Medications: ?I ordered medication including lactated Ringer's and 10 units of NovoLog. ? ?Disposition: ?Patient's fluids are still running and he has not yet been given insulin.  At this time, patient will be signed out to oncoming PA, [employer].  Please see her note for further work-up and ultimate disposition.  I suspect if the patient's blood sugar comes down in the 300s he will be discharged, otherwise he will likely be admitted. ?  ?I discussed this case with my attending physician Dr. Darl Householder who cosigned this note including patient's presenting symptoms, physical exam, and planned diagnostics and interventions. Attending physician stated agreement with plan or made changes to plan which were implemented.    ? ?Final Clinical Impression(s) / ED Diagnoses ?Final diagnoses:  ?Hyperglycemia  ? ?  ?Rhae Hammock, PA-C ?07/06/21 2208 ? ?  ?Drenda Freeze, MD ?07/06/21 2317 ? ?

## 2021-07-06 NOTE — ED Triage Notes (Signed)
BIB EMS from [facility] for hyperglycemia blood sugar read high 20G in RFA 500 ml bolus fluid after fluids blood sugar was 545. ?

## 2021-07-07 ENCOUNTER — Emergency Department (HOSPITAL_COMMUNITY)
Admission: EM | Admit: 2021-07-07 | Discharge: 2021-07-07 | Disposition: A | Discharge status: Home / Self Care | Payer: Medicaid Other | Source: Home / Self Care | Attending: Emergency Medicine | Admitting: Emergency Medicine

## 2021-07-07 DIAGNOSIS — Z79899 Other long term (current) drug therapy: Secondary | ICD-10-CM | POA: Insufficient documentation

## 2021-07-07 DIAGNOSIS — Z91198 Patient's noncompliance with other medical treatment and regimen for other reason: Secondary | ICD-10-CM | POA: Insufficient documentation

## 2021-07-07 DIAGNOSIS — Z91148 Patient's other noncompliance with medication regimen for other reason: Secondary | ICD-10-CM

## 2021-07-07 DIAGNOSIS — Z7984 Long term (current) use of oral hypoglycemic drugs: Secondary | ICD-10-CM | POA: Insufficient documentation

## 2021-07-07 DIAGNOSIS — Z794 Long term (current) use of insulin: Secondary | ICD-10-CM | POA: Insufficient documentation

## 2021-07-07 DIAGNOSIS — Z716 Tobacco abuse counseling: Secondary | ICD-10-CM | POA: Insufficient documentation

## 2021-07-07 DIAGNOSIS — I10 Essential (primary) hypertension: Secondary | ICD-10-CM | POA: Insufficient documentation

## 2021-07-07 DIAGNOSIS — E1140 Type 2 diabetes mellitus with diabetic neuropathy, unspecified: Secondary | ICD-10-CM | POA: Insufficient documentation

## 2021-07-07 DIAGNOSIS — F17210 Nicotine dependence, cigarettes, uncomplicated: Secondary | ICD-10-CM | POA: Insufficient documentation

## 2021-07-07 DIAGNOSIS — R739 Hyperglycemia, unspecified: Secondary | ICD-10-CM

## 2021-07-07 DIAGNOSIS — E1165 Type 2 diabetes mellitus with hyperglycemia: Secondary | ICD-10-CM | POA: Insufficient documentation

## 2021-07-07 LAB — BASIC METABOLIC PANEL
Anion gap: 10 (ref 5–15)
BUN: 10 mg/dL (ref 8–23)
CO2: 23 mmol/L (ref 22–32)
Calcium: 9.4 mg/dL (ref 8.9–10.3)
Chloride: 104 mmol/L (ref 98–111)
Creatinine, Ser: 0.88 mg/dL (ref 0.61–1.24)
GFR, Estimated: >60 mL/min (ref >60)
Glucose, Bld: 297 mg/dL — ABNORMAL HIGH (ref 70–99)
Potassium: 4.5 mmol/L (ref 3.5–5.1)
Sodium: 137 mmol/L (ref 135–145)

## 2021-07-07 LAB — CBG MONITORING, ED
Glucose-Capillary: 349 mg/dL — ABNORMAL HIGH (ref 70–99)
Glucose-Capillary: 286 mg/dL — ABNORMAL HIGH (ref 70–99)
Glucose-Capillary: 394 mg/dL — ABNORMAL HIGH (ref 70–99)

## 2021-07-07 LAB — I-STAT VENOUS BLOOD GAS, ED
Acid-Base Excess: 3 mmol/L — ABNORMAL HIGH (ref 0.0–2.0)
Bicarbonate: 25.8 mmol/L (ref 20.0–28.0)
Calcium, Ion: 1.12 mmol/L — ABNORMAL LOW (ref 1.15–1.40)
HCT: 41 % (ref 39.0–52.0)
Hemoglobin: 13.9 g/dL (ref 13.0–17.0)
O2 Saturation: 99 %
Potassium: 4.4 mmol/L (ref 3.5–5.1)
Sodium: 135 mmol/L (ref 135–145)
TCO2: 27 mmol/L (ref 22–32)
pCO2, Ven: 34.5 mmHg — ABNORMAL LOW (ref 44–60)
pH, Ven: 7.482 — ABNORMAL HIGH (ref 7.25–7.43)
pO2, Ven: 142 mmHg — ABNORMAL HIGH (ref 32–45)

## 2021-07-07 LAB — CBC
HCT: 40.7 % (ref 39.0–52.0)
Hemoglobin: 13.5 g/dL (ref 13.0–17.0)
MCH: 31.7 pg (ref 26.0–34.0)
MCHC: 33.2 g/dL (ref 30.0–36.0)
MCV: 95.5 fL (ref 80.0–100.0)
Platelets: 417 10*3/uL — ABNORMAL HIGH (ref 150–400)
RBC: 4.26 MIL/uL (ref 4.22–5.81)
RDW: 12.1 % (ref 11.5–15.5)
WBC: 8.4 10*3/uL (ref 4.0–10.5)
nRBC: 0 % (ref 0.0–0.2)

## 2021-07-07 LAB — OSMOLALITY: Osmolality: 296 mOsm/kg — ABNORMAL HIGH (ref 275–295)

## 2021-07-07 MED ORDER — LACTATED RINGERS IV BOLUS
1000.0000 mL | Freq: Once | INTRAVENOUS | Status: AC
  Administered 2021-07-07: 1000 mL via INTRAVENOUS

## 2021-07-07 NOTE — Discharge Instructions (Signed)
Please resume taking your medications as previously prescribed. Return to the ER with any new severe symptoms.  ?

## 2021-07-07 NOTE — ED Triage Notes (Signed)
Pt to triage via [facility] from [facility] for hyperglcyemia.  Pt discharged from ED this morning.  Took his insulin and blood sugar 540. ? ?Took 6 units this morning as instructed. ? ?IV- 20g LAC ?NS 453m bolus ? ?*Pt has walker with him. ? ? ?

## 2021-07-07 NOTE — Discharge Instructions (Addendum)
It was a pleasure caring for you today in the emergency department. ? ?Please return to the emergency department for any worsening or worrisome symptoms. ? ? ?Please take your home medications as prescribed. Please follow up with PCP regarding your diabetes ?

## 2021-07-07 NOTE — ED Provider Notes (Signed)
?[facility] ?Provider Note ? ? ?CSN: [id number] ?Arrival date & time: 07/07/21  1019 ? ?  ? ?History ? ?No chief complaint on file. ? ? ?David Wyatt is a 61 y.o. male. ? ? Patient as above with significant medical history as below, including controlled insulin-dependent diabetes, hypertension, hyperlipidemia, cocaine use who presents to the ED with complaint of hyperglycemia. ? ?She was seen yesterday with similar complaint.  No evidence of DKA at that time.  Patient took his blood sugar this morning it was over 500, he took insulin as prescribed and came to the ER. ? ?Upon arrival to the ER patient's blood sugar has improved.  He does report some mild polydipsia and polyuria.  No nausea or vomiting.  No fevers or chills.  No abdominal pain, chest pain.  No recent medication or diet changes.  No dysuria or hematuria.  No trauma. ? ? ? ? ?Past Medical History: ?07/28/2012: Angioedema of lips ?    Comment:  left upper (07/29/2012) ?No date: Arthritis ?    Comment:  hands and back ?No date: Chronic back pain ?No date: Cocaine abuse ([place]) ?No date: Diabetic neuropathy ([place]) ?No date: High cholesterol ?No date: Hypertension ?No date: Neuropathy ?No date: Type II diabetes mellitus ([place]) ? ?Past Surgical History: ?No date: BACK SURGERY ?02/23/2021: CYSTOSCOPY W/ URETERAL STENT PLACEMENT; Right ?    Comment:  Procedure: CYSTOSCOPY WITH RETROGRADE PYELOGRAM/URETERAL ?             STENT PLACEMENT;  Surgeon: Janith Lima, MD;  Location: ?             [facility];  Service: Urology;  Laterality: Right; ?04/01/2012: HERNIA REPAIR; Right ?12/13/2012: I & D EXTREMITY; Left ?    Comment:  Procedure: IRRIGATION AND DEBRIDEMENT LEFT THUMB;   ?             Surgeon: Tennis Must, MD;  Location: [facility];  Service:  ?             Orthopedics;  Laterality: Left; ?No date: INCISION AND DRAINAGE OF WOUND ?    Comment:  boil on back/notes 07/14/2008 (07/29/2012) ?04/01/2012: INGUINAL HERNIA REPAIR ?    Comment:   Procedure: HERNIA REPAIR INGUINAL ADULT;  Surgeon:  ?             Haywood Lasso, MD;  Location: [facility];  Service:  ?             General;  Laterality: Right; ?09/06/2016: INGUINAL HERNIA REPAIR; Left ?    Comment:  Procedure: OPEN REPAIR LEFT INGUINAL HERNIA;  Surgeon:  ?             Greer Pickerel, MD;  Location: [facility];  Service: General;   ?             Laterality: Left; ?09/06/2016: INSERTION OF MESH; Left ?    Comment:  Procedure: INSERTION OF MESH;  Surgeon: Greer Pickerel,  ?             MD;  Location: [facility];  Service: General;  Laterality:  ?             Left; ?No date: TONSILLECTOMY  ? ? ?The history is provided by the patient. No language interpreter was used.  ? ?  ? ?Home Medications ?Prior to Admission medications   ?Medication Sig Start Date End Date Taking? Authorizing Provider  ?amLODipine (NORVASC) 10 MG tablet Take 1  tablet (10 mg total) by mouth daily. 07/05/21  Yes Elsie Stain, MD  ?atorvastatin (LIPITOR) 40 MG tablet Take 1 tablet (40 mg total) by mouth daily. 03/22/21  Yes Mayers, Cari S, PA-C  ?carbamide peroxide (DEBROX) 6.5 % OTIC solution Place 5 drops into the right ear 2 (two) times daily. 07/04/21 08/03/22 Yes Barrett, Evelene Croon, PA-C  ?cyclobenzaprine (FLEXERIL) 10 MG tablet Take 1 tablet (10 mg total) by mouth 2 (two) times daily as needed for muscle spasms. 12/26/20  Yes [name], MD  ?DULoxetine (CYMBALTA) 60 MG capsule Take 1 capsule (60 mg total) by mouth daily. 03/22/21  Yes Mayers, Cari S, PA-C  ?empagliflozin (JARDIANCE) 10 MG TABS tablet Take 1 tablet (10 mg total) by mouth daily before breakfast. 03/22/21  Yes Mayers, Cari S, PA-C  ?gabapentin (NEURONTIN) 300 MG capsule Take 2 capsules (600 mg total) by mouth 3 (three) times daily. 03/22/21  Yes Mayers, Cari S, PA-C  ?glipiZIDE (GLUCOTROL XL) 10 MG 24 hr tablet Take 1 tablet (10 mg total) by mouth daily with breakfast. 03/22/21  Yes Mayers, Cari S, PA-C  ?hydrOXYzine (ATARAX) 25 MG tablet Take 1 tablet (25 mg total) by mouth  at bedtime. 07/05/21  Yes Elsie Stain, MD  ?Insulin Glargine Solostar (LANTUS) 100 UNIT/ML Solostar Pen Inject 12 Units into the skin daily. 07/05/21  Yes Elsie Stain, MD  ?metFORMIN (GLUCOPHAGE) 1000 MG tablet Take 1 tablet (1,000 mg total) by mouth 2 (two) times daily. 07/05/21  Yes Elsie Stain, MD  ?Accu-Chek Softclix Lancets lancets Test blood sugar 2 times daily 03/22/21   Mayers, Loraine Grip, PA-C  ?Accu-Chek Softclix Lancets lancets Use up to 4 times daily as directed 07/04/21   Barrett, Evelene Croon, PA-C  ?Blood Glucose Monitoring Suppl (ACCU-CHEK GUIDE) w/Device KIT USE AS DIRECTED TO CHECK BLOOD SUGARS UP TO TWICE PER DAY 07/05/21   [name], MD  ?glucose blood (ACCU-CHEK GUIDE) test strip Use upto 3 times daily as directed 07/05/21   [name], MD  ?Insulin Pen Needle 31G X 8 MM MISC Use with insulin pen 07/05/21   Elsie Stain, MD  ?loratadine (CLARITIN) 10 MG tablet Take 1 tablet (10 mg total) by mouth daily. ?Patient not taking: No sig reported 06/03/17 07/20/20  [name], MD  ?   ? ?Allergies    ?Lisinopril   ? ?Review of Systems   ?Review of Systems  ?Constitutional:  Negative for chills and fever.  ?HENT:  Negative for facial swelling and trouble swallowing.   ?Eyes:  Negative for photophobia and visual disturbance.  ?Respiratory:  Negative for cough and shortness of breath.   ?Cardiovascular:  Negative for chest pain and palpitations.  ?Gastrointestinal:  Negative for abdominal pain, nausea and vomiting.  ?Endocrine: Positive for polydipsia and polyuria.  ?Genitourinary:  Negative for difficulty urinating and hematuria.  ?Musculoskeletal:  Negative for gait problem and joint swelling.  ?Skin:  Negative for pallor and rash.  ?Neurological:  Negative for syncope and headaches.  ?Psychiatric/Behavioral:  Negative for agitation and confusion.   ? ?Physical Exam ?Updated Vital Signs ?BP 138/89 (BP Location: Right Arm)   Pulse 70   Temp 98.5 ?F (36.9 ?C)   Resp 18   SpO2 95%   ?Physical Exam ?Vitals and nursing note reviewed.  ?Constitutional:   ?   General: He is not in acute distress. ?   Appearance: Normal appearance. He is well-developed. He is not ill-appearing, toxic-appearing or diaphoretic.  ?HENT:  ?  Head: Normocephalic and atraumatic.  ?   Right Ear: External ear normal.  ?   Left Ear: External ear normal.  ?   Mouth/Throat:  ?   Mouth: Mucous membranes are moist.  ?Eyes:  ?   General: No scleral icterus. ?Cardiovascular:  ?   Rate and Rhythm: Normal rate and regular rhythm.  ?   Pulses: Normal pulses.  ?   Heart sounds: Normal heart sounds.  ?Pulmonary:  ?   Effort: Pulmonary effort is normal. No respiratory distress.  ?   Breath sounds: Normal breath sounds. No stridor.  ?Abdominal:  ?   General: Abdomen is flat.  ?   Palpations: Abdomen is soft.  ?   Tenderness: There is no abdominal tenderness. There is no guarding or rebound.  ?Musculoskeletal:     ?   General: Normal range of motion.  ?   Cervical back: Normal range of motion.  ?   Right lower leg: No edema.  ?   Left lower leg: No edema.  ?Skin: ?   General: Skin is warm and dry.  ?   Capillary Refill: Capillary refill takes less than 2 seconds.  ?Neurological:  ?   Mental Status: He is alert and oriented to person, place, and time.  ?Psychiatric:     ?   Mood and Affect: Mood normal.     ?   Behavior: Behavior normal.  ? ? ?ED Results / Procedures / Treatments   ?Labs ?(all labs ordered are listed, but only abnormal results are displayed) ?Labs Reviewed  ?CBC - Abnormal; Notable for the following components:  ?    Result Value  ? Platelets 417 (*)   ? All other components within normal limits  ?BASIC METABOLIC PANEL - Abnormal; Notable for the following components:  ? Glucose, Bld 297 (*)   ? All other components within normal limits  ?OSMOLALITY - Abnormal; Notable for the following components:  ? Osmolality 296 (*)   ? All other components within normal limits  ?CBG MONITORING, ED - Abnormal; Notable for the  following components:  ? Glucose-Capillary 349 (*)   ? All other components within normal limits  ?I-STAT VENOUS BLOOD GAS, ED - Abnormal; Notable for the following components:  ? pH, Ven 7.482 (*)   ? pCO2, Lawson Fiscal

## 2021-07-11 ENCOUNTER — Encounter: Payer: Self-pay | Admitting: Physician Assistant

## 2021-07-11 ENCOUNTER — Other Ambulatory Visit: Payer: Self-pay | Admitting: Critical Care Medicine

## 2021-07-11 NOTE — Progress Notes (Signed)
Pt seen by Dr Joya Gaskins ? ?He was having problems using the insulin pen. He tried to push it down after turning it, but that did not work.  ? ?325 this am.  ? ?317 236 ? ?400s the day he went to the ER ? ?05/02, 130, 138, 324 ? ?He gave his insulin to a staffer, now they cannot find it. Bonnita Nasuti has looked high and low. Then, THEY FOUND IT! It was on a back shelf in the refrigerator.  ? ?His CBG now is 301, has not had insulin today. He had lost his glipizide, but found it. He does not have Jardiance.  ? ?Nason gave him instructions on giving himself his insulin, helped him successfully take a dose.  ? ?Insulin dose increased to 14 u/day.  ? ?He cannot hear, used the Debrox drops but they ran out, did not bring any wax out with it. Will have to get lavage. ? ?Rosaria Ferries, PA-C ?07/11/2021 ?3:51 PM ? ? ? ? ? ?

## 2021-07-12 ENCOUNTER — Ambulatory Visit: Payer: Medicaid Other

## 2021-07-13 ENCOUNTER — Other Ambulatory Visit: Payer: Self-pay

## 2021-07-18 ENCOUNTER — Encounter: Payer: Self-pay | Admitting: Critical Care Medicine

## 2021-07-18 ENCOUNTER — Other Ambulatory Visit: Payer: Self-pay | Admitting: Critical Care Medicine

## 2021-07-18 ENCOUNTER — Other Ambulatory Visit (HOSPITAL_COMMUNITY): Payer: Self-pay

## 2021-07-18 ENCOUNTER — Encounter: Payer: Self-pay | Admitting: *Deleted

## 2021-07-18 DIAGNOSIS — E1165 Type 2 diabetes mellitus with hyperglycemia: Secondary | ICD-10-CM

## 2021-07-18 MED ORDER — INSULIN LISPRO (1 UNIT DIAL) 100 UNIT/ML (KWIKPEN)
5.0000 [IU] | PEN_INJECTOR | Freq: Two times a day (BID) | SUBCUTANEOUS | 6 refills | Status: DC
  Filled 2021-07-18: qty 3, 30d supply, fill #0
  Filled 2021-08-16: qty 3, 28d supply, fill #1
  Filled 2021-09-19: qty 3, 28d supply, fill #2

## 2021-07-18 MED ORDER — METFORMIN HCL 1000 MG PO TABS
1000.0000 mg | ORAL_TABLET | Freq: Two times a day (BID) | ORAL | 6 refills | Status: DC
  Filled 2021-07-18: qty 60, 30d supply, fill #0
  Filled 2021-08-23: qty 60, 30d supply, fill #1

## 2021-07-18 MED ORDER — INSULIN GLARGINE SOLOSTAR 100 UNIT/ML ~~LOC~~ SOPN: 24.0000 [IU] | PEN_INJECTOR | Freq: Every day | SUBCUTANEOUS | 2 refills | Status: DC

## 2021-07-18 MED ORDER — EMPAGLIFLOZIN 10 MG PO TABS
10.0000 mg | ORAL_TABLET | Freq: Every day | ORAL | 6 refills | Status: DC
  Filled 2021-07-18: qty 30, 30d supply, fill #0
  Filled 2021-08-23: qty 30, 30d supply, fill #1

## 2021-07-18 NOTE — [facility]regational Nurse Program (Signed)
Pt states he has used a months worth of insulin in 6 days, will see dr Joya Gaskins today in clinic. ?

## 2021-07-19 ENCOUNTER — Telehealth (HOSPITAL_COMMUNITY): Payer: Self-pay | Admitting: Licensed Clinical Social Worker

## 2021-07-19 ENCOUNTER — Other Ambulatory Visit (HOSPITAL_COMMUNITY): Payer: Self-pay

## 2021-07-19 ENCOUNTER — Ambulatory Visit (HOSPITAL_COMMUNITY): Payer: Medicaid Other | Admitting: Licensed Clinical Social Worker

## 2021-07-19 ENCOUNTER — Encounter (HOSPITAL_COMMUNITY): Payer: Self-pay

## 2021-07-19 NOTE — Progress Notes (Signed)
Patient ID: David Wyatt, male   DOB: May 15, 1960, 61 y.o.   MRN: [id number] ? ?This is a 61 year old male seen at the [facility].  [facility]  Patient states he is having elevated blood sugars. ? ?He is normally on insulin glargine 12 units daily.  He has been increasing the dose and taking an extra dose daily because his blood sugars have been greater than 300.  He missed his follow-up appointment last week.  He is also got bilateral cerumen impaction needs to have his ears cleaned. ? ?Blood sugar today obtained is greater than 300. ? ?Plan is to increase his insulin glargine to 24 units daily and to begin Humalog KwikPen 5 units twice daily before lunch and dinner.  He is instructed to come to the [facility] clinic 1:30 PM on Monday, May 15 where I will see him we will get him reconnected to our clinical pharmacist for his insulin management we will have his ears cleaned at that visit. ?

## 2021-07-19 NOTE — Telephone Encounter (Signed)
Pt has no active phone number in epic system. There were not active notes from administrative staff to call different number, and no release of information on file for emergency contact. LCSW did send links to patient e-mail that is listed with no response. Pt will be marked as a no show for intake appointment  ?

## 2021-07-20 ENCOUNTER — Emergency Department (HOSPITAL_COMMUNITY)
Admission: EM | Admit: 2021-07-20 | Discharge: 2021-07-21 | Disposition: A | Discharge status: Home / Self Care | Payer: Medicaid Other | Attending: Emergency Medicine | Admitting: Emergency Medicine

## 2021-07-20 ENCOUNTER — Emergency Department (HOSPITAL_COMMUNITY): Payer: Medicaid Other

## 2021-07-20 ENCOUNTER — Encounter (HOSPITAL_COMMUNITY): Payer: Self-pay

## 2021-07-20 ENCOUNTER — Other Ambulatory Visit: Payer: Self-pay

## 2021-07-20 DIAGNOSIS — R7401 Elevation of levels of liver transaminase levels: Secondary | ICD-10-CM | POA: Insufficient documentation

## 2021-07-20 DIAGNOSIS — D72829 Elevated white blood cell count, unspecified: Secondary | ICD-10-CM | POA: Diagnosis not present

## 2021-07-20 DIAGNOSIS — R739 Hyperglycemia, unspecified: Secondary | ICD-10-CM | POA: Insufficient documentation

## 2021-07-20 DIAGNOSIS — M47896 Other spondylosis, lumbar region: Secondary | ICD-10-CM | POA: Diagnosis not present

## 2021-07-20 DIAGNOSIS — R252 Cramp and spasm: Secondary | ICD-10-CM | POA: Diagnosis present

## 2021-07-20 DIAGNOSIS — Z794 Long term (current) use of insulin: Secondary | ICD-10-CM | POA: Insufficient documentation

## 2021-07-20 DIAGNOSIS — M19011 Primary osteoarthritis, right shoulder: Secondary | ICD-10-CM | POA: Diagnosis not present

## 2021-07-20 DIAGNOSIS — R310 Gross hematuria: Secondary | ICD-10-CM | POA: Diagnosis not present

## 2021-07-20 DIAGNOSIS — M47816 Spondylosis without myelopathy or radiculopathy, lumbar region: Secondary | ICD-10-CM

## 2021-07-20 LAB — CBC WITH DIFFERENTIAL/PLATELET
Abs Immature Granulocytes: 0.19 10*3/uL — ABNORMAL HIGH (ref 0.00–0.07)
Basophils Absolute: 0.1 10*3/uL (ref 0.0–0.1)
Basophils Relative: 1 %
Eosinophils Absolute: 0.4 10*3/uL (ref 0.0–0.5)
Eosinophils Relative: 3 %
HCT: 42.3 % (ref 39.0–52.0)
Hemoglobin: 14.3 g/dL (ref 13.0–17.0)
Immature Granulocytes: 2 %
Lymphocytes Relative: 24 %
Lymphs Abs: 2.6 10*3/uL (ref 0.7–4.0)
MCH: 32.1 pg (ref 26.0–34.0)
MCHC: 33.8 g/dL (ref 30.0–36.0)
MCV: 95.1 fL (ref 80.0–100.0)
Monocytes Absolute: 0.8 10*3/uL (ref 0.1–1.0)
Monocytes Relative: 7 %
Neutro Abs: 6.7 10*3/uL (ref 1.7–7.7)
Neutrophils Relative %: 63 %
Platelets: 409 10*3/uL — ABNORMAL HIGH (ref 150–400)
RBC: 4.45 MIL/uL (ref 4.22–5.81)
RDW: 12.5 % (ref 11.5–15.5)
WBC: 10.8 10*3/uL — ABNORMAL HIGH (ref 4.0–10.5)
nRBC: 0 % (ref 0.0–0.2)

## 2021-07-20 LAB — COMPREHENSIVE METABOLIC PANEL
ALT: 53 U/L — ABNORMAL HIGH (ref 0–44)
AST: 44 U/L — ABNORMAL HIGH (ref 15–41)
Albumin: 3.6 g/dL (ref 3.5–5.0)
Alkaline Phosphatase: 98 U/L (ref 38–126)
Anion gap: 10 (ref 5–15)
BUN: 15 mg/dL (ref 8–23)
CO2: 25 mmol/L (ref 22–32)
Calcium: 9.5 mg/dL (ref 8.9–10.3)
Chloride: 102 mmol/L (ref 98–111)
Creatinine, Ser: 1.25 mg/dL — ABNORMAL HIGH (ref 0.61–1.24)
GFR, Estimated: >60 mL/min (ref >60)
Glucose, Bld: 289 mg/dL — ABNORMAL HIGH (ref 70–99)
Potassium: 4.4 mmol/L (ref 3.5–5.1)
Sodium: 137 mmol/L (ref 135–145)
Total Bilirubin: 0.5 mg/dL (ref 0.3–1.2)
Total Protein: 7.2 g/dL (ref 6.5–8.1)

## 2021-07-20 LAB — URINALYSIS, ROUTINE W REFLEX MICROSCOPIC
Bilirubin Urine: NEGATIVE
Glucose, UA: >=500 mg/dL — AB
Ketones, ur: NEGATIVE mg/dL
Leukocytes,Ua: NEGATIVE
Nitrite: NEGATIVE
Protein, ur: NEGATIVE mg/dL
Specific Gravity, Urine: 1.029 (ref 1.005–1.030)
pH: 6 (ref 5.0–8.0)

## 2021-07-20 LAB — CBG MONITORING, ED: Glucose-Capillary: 213 mg/dL — ABNORMAL HIGH (ref 70–99)

## 2021-07-20 LAB — CK: Total CK: 212 U/L (ref 49–397)

## 2021-07-20 MED ORDER — METHOCARBAMOL 500 MG PO TABS
500.0000 mg | ORAL_TABLET | Freq: Two times a day (BID) | ORAL | 0 refills | Status: DC | PRN
  Filled 2021-07-20: qty 20, 7d supply, fill #0

## 2021-07-20 MED ORDER — METHOCARBAMOL 500 MG PO TABS
500.0000 mg | ORAL_TABLET | Freq: Once | ORAL | Status: AC
  Administered 2021-07-20: 500 mg via ORAL
  Filled 2021-07-20: qty 1

## 2021-07-20 NOTE — ED Triage Notes (Signed)
Pt states, my right arm and leg keeps locking on mex2 days." Pt states he puts his arm up, but his arm is hard to bring back down. Pt states he took new insulin yesterday. Pt was able to walk to bathroom well. ?

## 2021-07-20 NOTE — Discharge Instructions (Signed)
You may try taking an over the counter magnesium supplement for cramps. You may ask the pharmacist for recommendations.  ?Your xrays showed  bad arthritis. Please follow up as directed. ?Take the medication I have ordered for cramps or spasms.  ? ?Contact a health care provider if: ?Your leg cramps get more severe or more frequent, or they do not improve over time. ?Your foot becomes cold, numb, or blue. ?

## 2021-07-20 NOTE — ED Provider Triage Note (Signed)
Emergency Medicine Provider Triage Evaluation Note ? ?[name] , a 61 y.o. male  was evaluated in triage.  Pt complains of bilateral arm pain, left worse than the right that comes in waves.  States the pain is sharp and strong, and waxes and wanes.  Feels like he is unable to sit still.  States 1 week ago he noticed he was "peeing red", and has not been paying attention to the color of his urine since.  Also endorses mild right flank tenderness.  Denies dysuria, nausea, vomiting, fever, diarrhea, constipation.  Denies chest pain or shortness of breath.  Hx of HTN, cocaine abuse, DMT2 with neuropathy, chronic tobacco use, GERD.  Denies recent alcohol or drug use. ? ?Review of Systems  ?Positive: As above ?Negative: As above ? ?Physical Exam  ?BP (!) 151/98 (BP Location: Right Arm)   Pulse (!) 110   Temp 98.1 ?F (36.7 ?C) (Oral)   Resp 18   Ht '5\' 8"'$  (1.727 m)   Wt 71.7 kg   SpO2 97%   BMI 24.03 kg/m?  ?Gen:   Awake, no distress, unable to clinically sit still due to subjective tenderness ?Resp:  Normal effort, CTAB ?MSK:   Moves extremities with some difficulty, due to subjective tenderness of muscle spasms ?Other:  Pulses 2+ of radial, DP, PT.  CRT less than 2 seconds.  Sensation intact of upper and lower extremities.  Pronator drift negative.  No facial asymmetry or aphasia.  Coordination and gait grossly intact.  Gaze aligned appropriately.  Right flank tenderness. ? ?Medical Decision Making  ?Medically screening exam initiated at 1:07 PM.  Appropriate orders placed.  [name] was informed that the remainder of the evaluation will be completed by another provider, this initial triage assessment does not replace that evaluation, and the importance of remaining in the ED until their evaluation is complete. ? ?Labs ordered ?  ?Prince Rome, PA-C ?51/70/01 1326 ? ?

## 2021-07-20 NOTE — ED Provider Notes (Signed)
?[facility] ?Provider Note ? ? ?CSN: [id number] ?Arrival date & time: 07/20/21  1205 ? ?  ? ?History ? ?Chief Complaint  ?Patient presents with  ? Arm Problem  ? ? ?David Wyatt is a 61 y.o. male who presents emergency department with a chief complaint of leg cramps and shoulder pain.  Patient states that he feet and legs cramp up on him and his feet will turn inward.  This occurs at rest and sometimes when he is walking.  He has a history of previous back surgery.  He denies any new weakness.  He is not using any illicit drugs or drinking alcohol.  He is not on any diuretics.  Patient also complains of severe pain in his right shoulder which sometimes gets stuck in certain positions and he has trouble getting it back down.  He denies any new or worsening symptoms.  He denies numbness or tingling in the upper extremity. ? ?HPI ? ?  ? ?Home Medications ?Prior to Admission medications   ?Medication Sig Start Date End Date Taking? Authorizing Provider  ?methocarbamol (ROBAXIN) 500 MG tablet Take 1 tablet (500 mg total) by mouth 3 times/day as needed-between meals & bedtime for muscle spasms. 07/20/21  Yes Margarita Mail, PA-C  ?Accu-Chek Softclix Lancets lancets Test blood sugar 2 times daily 03/22/21   Mayers, Loraine Grip, PA-C  ?Accu-Chek Softclix Lancets lancets Use up to 4 times daily as directed 07/04/21   Barrett, Evelene Croon, PA-C  ?amLODipine (NORVASC) 10 MG tablet Take 1 tablet (10 mg total) by mouth daily. 07/05/21   Elsie Stain, MD  ?atorvastatin (LIPITOR) 40 MG tablet Take 1 tablet (40 mg total) by mouth daily. 03/22/21   Mayers, Loraine Grip, PA-C  ?Blood Glucose Monitoring Suppl (ACCU-CHEK GUIDE) w/Device KIT USE AS DIRECTED TO CHECK BLOOD SUGARS UP TO TWICE PER DAY 07/05/21   [name], MD  ?carbamide peroxide (DEBROX) 6.5 % OTIC solution Place 5 drops into the right ear 2 (two) times daily. 07/04/21 08/03/22  Barrett, Evelene Croon, PA-C  ?cyclobenzaprine (FLEXERIL) 10 MG tablet  Take 1 tablet (10 mg total) by mouth 2 (two) times daily as needed for muscle spasms. 12/26/20   [name], MD  ?DULoxetine (CYMBALTA) 60 MG capsule Take 1 capsule (60 mg total) by mouth daily. 03/22/21   Mayers, Cari S, PA-C  ?empagliflozin (JARDIANCE) 10 MG TABS tablet Take 1 tablet (10 mg total) by mouth daily before breakfast. 07/18/21   Elsie Stain, MD  ?gabapentin (NEURONTIN) 300 MG capsule Take 2 capsules (600 mg total) by mouth 3 (three) times daily. 03/22/21   Mayers, Cari S, PA-C  ?glipiZIDE (GLUCOTROL XL) 10 MG 24 hr tablet Take 1 tablet (10 mg total) by mouth daily with breakfast. 03/22/21   Mayers, Cari S, PA-C  ?glucose blood (ACCU-CHEK GUIDE) test strip Use upto 3 times daily as directed 07/05/21   [name], MD  ?hydrOXYzine (ATARAX) 25 MG tablet Take 1 tablet (25 mg total) by mouth at bedtime. 07/05/21   Elsie Stain, MD  ?Insulin Glargine Solostar (LANTUS) 100 UNIT/ML Solostar Pen Inject 24 Units into the skin daily. 07/18/21   Elsie Stain, MD  ?insulin lispro (HUMALOG) 100 UNIT/ML KwikPen Inject 5 Units into the skin 2 (two) times daily before lunch and supper. 07/18/21   Elsie Stain, MD  ?Insulin Pen Needle 31G X 8 MM MISC Use with insulin pen 07/05/21   Elsie Stain, MD  ?metFORMIN (GLUCOPHAGE) 1000  MG tablet Take 1 tablet (1,000 mg total) by mouth 2 (two) times daily. 07/18/21   Elsie Stain, MD  ?loratadine (CLARITIN) 10 MG tablet Take 1 tablet (10 mg total) by mouth daily. ?Patient not taking: No sig reported 06/03/17 07/20/20  [name], MD  ?   ? ?Allergies    ?Lisinopril   ? ?Review of Systems   ?Review of Systems ? ?Physical Exam ?Updated Vital Signs ?BP 128/82 (BP Location: Right Arm)   Pulse 76   Temp 98.3 ?F (36.8 ?C) (Oral)   Resp 18   Ht 5' 8"  (1.727 m)   Wt 71.7 kg   SpO2 98%   BMI 24.03 kg/m?  ?Physical Exam ?Vitals and nursing note reviewed.  ?Constitutional:   ?   General: He is not in acute distress. ?   Appearance: He is  well-developed. He is not diaphoretic.  ?HENT:  ?   Head: Normocephalic and atraumatic.  ?Eyes:  ?   General: No scleral icterus. ?   Conjunctiva/sclera: Conjunctivae normal.  ?Cardiovascular:  ?   Rate and Rhythm: Normal rate and regular rhythm.  ?   Pulses:     ?     Dorsalis pedis pulses are 2+ on the right side and 2+ on the left side.  ?   Heart sounds: Normal heart sounds.  ?Pulmonary:  ?   Effort: Pulmonary effort is normal. No respiratory distress.  ?   Breath sounds: Normal breath sounds.  ?Abdominal:  ?   Palpations: Abdomen is soft.  ?   Tenderness: There is no abdominal tenderness.  ?Musculoskeletal:  ?   Cervical back: Normal range of motion and neck supple.  ?Skin: ?   General: Skin is warm and dry.  ?Neurological:  ?   Mental Status: He is alert.  ?Psychiatric:     ?   Behavior: Behavior normal.  ? ? ?ED Results / Procedures / Treatments   ?Labs ?(all labs ordered are listed, but only abnormal results are displayed) ?Labs Reviewed  ?COMPREHENSIVE METABOLIC PANEL - Abnormal; Notable for the following components:  ?    Result Value  ? Glucose, Bld 289 (*)   ? Creatinine, Ser 1.25 (*)   ? AST 44 (*)   ? ALT 53 (*)   ? All other components within normal limits  ?CBC WITH DIFFERENTIAL/PLATELET - Abnormal; Notable for the following components:  ? WBC 10.8 (*)   ? Platelets 409 (*)   ? Abs Immature Granulocytes 0.19 (*)   ? All other components within normal limits  ?URINALYSIS, ROUTINE W REFLEX MICROSCOPIC - Abnormal; Notable for the following components:  ? Glucose, UA >=500 (*)   ? Hgb urine dipstick SMALL (*)   ? Bacteria, UA RARE (*)   ? All other components within normal limits  ?CBG MONITORING, ED - Abnormal; Notable for the following components:  ? Glucose-Capillary 213 (*)   ? All other components within normal limits  ?CK  ? ? ?EKG ?None ? ?Radiology ?DG Lumbar Spine Complete ? ?Result Date: 07/20/2021 ?CLINICAL DATA:  Lower back pain. EXAM: LUMBAR SPINE - COMPLETE 4+ VIEW COMPARISON:  February 17, 2021 FINDINGS: There is no evidence of lumbar spine fracture. Bilateral radiopaque pedicle screws are seen at the levels of L4 and L5. Alignment is normal. Mild to moderate severity endplate sclerosis is seen at the levels of L1-L2, L2-L3 and L3-L4. Marked severity endplate sclerosis is seen at the level of L5-S1. Moderate severity intervertebral disc space narrowing  is seen at L5-S1. IMPRESSION: 1. Stable postoperative changes at L4-L5. 2. Moderate to marked severity degenerative disc disease at L5-S1. Electronically Signed   By: Virgina Norfolk M.D.   On: 07/20/2021 21:17  ? ?DG Shoulder Right ? ?Result Date: 07/20/2021 ?CLINICAL DATA:  Lower back pain and unable to move his right arm. EXAM: RIGHT SHOULDER - 2+ VIEW COMPARISON:  December 18, 2020 FINDINGS: There is no evidence of fracture or dislocation. Mild to moderate severity degenerative changes are seen involving the right acromioclavicular joint and right glenohumeral articulation. Soft tissues are unremarkable. IMPRESSION: Degenerative changes involving the right acromioclavicular joint and right glenohumeral articulation. Electronically Signed   By: Virgina Norfolk M.D.   On: 07/20/2021 21:19   ? ?Procedures ?Procedures  ? ? ?Medications Ordered in ED ?Medications  ?methocarbamol (ROBAXIN) tablet 500 mg (500 mg Oral Given 07/20/21 2319)  ? ? ?ED Course/ Medical Decision Making/ A&P ?  ?                        ?Medical Decision Making ?61 year old male here with leg cramps, shoulder pain. ?Differential diagnosis includes electrolyte disturbance, neurogenic claudication, claudication, radiculopathy.  Patient has a history of back pain and surgery.  Imaging shows no acute changes.  Will refer to neurosurgery. ?Patient complaining of "peeing red.  Labs reviewed.  He has some hematuria.  Referred to his PCP for further management.  Patient have a chronic right shoulder pain.  Shoulder is locking up.  Imaging shows advanced degenerative changes.  Will refer to  PCP/Ortho for further management.  Patient does not appear to have any other acute findings and appears reasonably medically screened.  Will discharge with suggestion to take oral magnesium and Robaxin.  He may take

## 2021-07-21 NOTE — Progress Notes (Signed)
? ?Established Patient Office Visit ? ?Subjective   ?Patient ID: David Wyatt, male    DOB: 25-Sep-1960  Age: 61 y.o. MRN: [id number] ? ?Chief Complaint  ?Patient presents with  ? Ear Fullness  ? ? ?PCP [facility] ? ?[facility]: ?This is a 61 year old male seen at the [place] [facility].  [facility]  Patient states he is having elevated blood sugars. ? ?He is normally on insulin glargine 12 units daily.  He has been increasing the dose and taking an extra dose daily because his blood sugars have been greater than 300.  He missed his follow-up appointment last week.  He is also got bilateral cerumen impaction needs to have his ears cleaned. ? ?Blood sugar today obtained is greater than 300. ? ?Plan is to increase his insulin glargine to 24 units daily and to begin Humalog KwikPen 5 units twice daily before lunch and dinner.  He is instructed to come to the [facility] 1:30 PM on Monday, May 15 where I will see him we will get him reconnected to our clinical pharmacist for his insulin management we will have his ears cleaned at that visit. ? ?Patient comes into the clinic today and I saw the patient for Dr. Margarita Rana ? ?Patient has had difficulty with cerumen impaction and we tried Debrox in the shelter clinic but did not resolve the problem.  Another issue is that he has type 2 diabetes and he has had difficulty managing his sugar in the shelter environment.  Also blood pressure has been elevated on arrival was 144/90 and on recheck was 135/85 ? ?Blood sugar on arrival was 210 A1c of 11 ? ?Patient was supposed to increase insulin glargine to 24 units but he is kept the 12 unit daily dose.  The patient also was to start Humalog and he has been taking at 5 units twice daily.  He does take the Jardiance and the metformin. ? ?Another issue is that he has had hematuria and has had a prior work-up showing stone disease for which she has a  ureteral stent in his right kidney also has a renal mass as well.  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 ? ? ? ?Past history reviewed no changes ? ?Review of Systems  ?Constitutional:  Negative for chills, diaphoresis, fever, malaise/fatigue and weight loss.  ?HENT:  Positive for hearing loss. Negative for congestion, nosebleeds, sore throat and tinnitus.   ?Eyes:  Negative for blurred vision, photophobia and redness.  ?Respiratory:  Negative for cough, hemoptysis, sputum production, shortness of breath, wheezing and stridor.   ?Cardiovascular:  Negative for chest pain, palpitations, orthopnea, claudication, leg swelling and PND.  ?Gastrointestinal:  Negative for abdominal pain, blood in stool, constipation, diarrhea, heartburn, nausea and vomiting.  ?Genitourinary:  Positive for hematuria. Negative for dysuria, flank pain, frequency and urgency.  ?Musculoskeletal:  Positive for joint pain. Negative for back pain, falls, myalgias and neck pain.  ?Skin:  Negative for itching and rash.  ?Neurological:  Negative for dizziness, tingling, tremors, sensory change, speech change, focal weakness, seizures, loss of consciousness, weakness and headaches.  ?Endo/Heme/Allergies:  Positive for polydipsia. Negative for environmental allergies. Does not bruise/bleed easily.  ?Psychiatric/Behavioral:  Negative for depression, memory loss, substance abuse and suicidal ideas. The patient is  not nervous/anxious and does not have insomnia.   ? ?  ?Objective:  ?  ? ?BP (!) 144/90   Pulse 95   Wt 158 lb 12.8 oz (72 kg)   SpO2 94%   BMI 24.15 kg/m?  ? ? ?Physical Exam ?Vitals reviewed.  ?Constitutional:   ?   Appearance: Normal appearance. He is well-developed. He is not diaphoretic.  ?HENT:  ?   Head: Normocephalic and atraumatic.  ?   Right Ear: There is impacted cerumen.  ?   Left Ear:  There is impacted cerumen.  ?   Nose: No nasal deformity, septal deviation, mucosal edema or rhinorrhea.  ?   Right Sinus: No maxillary sinus tenderness or frontal sinus tenderness.  ?   Left Sinus: No maxillary sinus tenderness or frontal sinus tenderness.  ?   Mouth/Throat:  ?   Pharynx: No oropharyngeal exudate.  ?Eyes:  ?   General: No scleral icterus. ?   Conjunctiva/sclera: Conjunctivae normal.  ?   Pupils: Pupils are equal, round, and reactive to light.  ?Neck:  ?   Thyroid: No thyromegaly.  ?   Vascular: No carotid bruit or JVD.  ?   Trachea: Trachea normal. No tracheal tenderness or tracheal deviation.  ?Cardiovascular:  ?   Rate and Rhythm: Normal rate and regular rhythm.  ?   Chest Wall: PMI is not displaced.  ?   Pulses: Normal pulses. No decreased pulses.  ?   Heart sounds: Normal heart sounds, S1 normal and S2 normal. Heart sounds not distant. No murmur heard. ?No systolic murmur is present.  ?No diastolic murmur is present.  ?  No friction rub. No gallop. No S3 or S4 sounds.  ?Pulmonary:  ?   Effort: No tachypnea, accessory muscle usage or respiratory distress.  ?   Breath sounds: No stridor. No decreased breath sounds, wheezing, rhonchi or rales.  ?Chest:  ?   Chest wall: No tenderness.  ?Abdominal:  ?   General: Bowel sounds are normal. There is no distension.  ?   Palpations: Abdomen is soft. Abdomen is not rigid.  ?   Tenderness: There is no abdominal tenderness. There is no guarding or rebound.  ?Musculoskeletal:     ?   General: Tenderness present.  ?   Cervical back: Normal range of motion and neck supple. No edema, erythema or rigidity. No muscular tenderness. Normal range of motion.  ?   Comments: Tender right shoulder difficulty with full and complete abduction  ?Lymphadenopathy:  ?   Head:  ?   Right side of head: No submental or submandibular adenopathy.  ?   Left side of head: No submental or submandibular adenopathy.  ?   Cervical: No cervical adenopathy.  ?Skin: ?   General: Skin is  warm and dry.  ?   Coloration: Skin is not pale.  ?   Findings: No rash.  ?   Nails: There is no clubbing.  ?   Comments: Dermoid cyst upper back midline  ?Neurological:  ?   Mental Status: He is alert and oriented to person, place, and time.  ?   Sensory: No sensory deficit.  ?Psychiatric:     ?   Speech: Speech normal.     ?   Behavior: Behavior normal.  ? ?The patient underwent lavage of both ears with Debrox prior to same.  Partial resolution of impaction seen in both ears ? ?Results for orders placed or performed in visit on 07/23/21  ?POCT glucose (  manual entry)  ?Result Value Ref Range  ? POC Glucose 210 (A) 70 - 99 mg/dl  ? ? ? ? ?The 10-year ASCVD risk score (Arnett DK, et al., 2019) is: 43.3% ? ?  ?Assessment & Plan:  ? ?Problem List Items Addressed This Visit   ? ?  ? Cardiovascular and Mediastinum  ? Essential hypertension, benign  ?  Blood pressure at goal no changes made ? ?  ?  ? Relevant Medications  ? amLODipine (NORVASC) 10 MG tablet  ?  ? Endocrine  ? Uncontrolled type 2 diabetes mellitus with hyperglycemia (HCC) - Primary  ?  Uncontrolled type 2 diabetes with hyperglycemia ? ?Plan is to increase insulin glargine 24 units daily continue Humalog 5 units twice daily continue metformin and Jardiance as prescribed ? ?Patient return in short-term follow-up with clinical pharmacy ? ?  ?  ? Relevant Medications  ? glipiZIDE (GLUCOTROL XL) 10 MG 24 hr tablet  ? Other Relevant Orders  ? POCT glucose (manual entry) (Completed)  ?  ? Nervous and Auditory  ? Bilateral impacted cerumen  ?  Status post ear lavage we will follow-up in the clinic may need ENT ? ?  ?  ? Relevant Orders  ? Ear Lavage  ?  ? Musculoskeletal and Integument  ? Dermoid cyst of skin of back  ?  Referral to surgery made ? ?  ?  ? Relevant Orders  ? Ambulatory referral to General Surgery  ?  ? Genitourinary  ? Renal stones  ?  Renal stones with partial hydronephrosis right kidney plan is to have patient follow-up with urology ? ?  ?  ?  Relevant Orders  ? Ambulatory referral to Urology  ?  ? Other  ? Left kidney mass  ?  Patient needs follow-up with urology ? ?  ?  ? Chronic right shoulder pain  ? Relevant Medications  ? meloxicam (MOBIC) 15 MG tab

## 2021-07-23 ENCOUNTER — Other Ambulatory Visit: Payer: Self-pay

## 2021-07-23 ENCOUNTER — Ambulatory Visit: Payer: Medicaid Other | Attending: Critical Care Medicine | Admitting: Critical Care Medicine

## 2021-07-23 ENCOUNTER — Encounter: Payer: Self-pay | Admitting: Critical Care Medicine

## 2021-07-23 VITALS — BP 135/85 | HR 95 | Wt 158.8 lb

## 2021-07-23 DIAGNOSIS — H6123 Impacted cerumen, bilateral: Secondary | ICD-10-CM | POA: Insufficient documentation

## 2021-07-23 DIAGNOSIS — I10 Essential (primary) hypertension: Secondary | ICD-10-CM

## 2021-07-23 DIAGNOSIS — E1165 Type 2 diabetes mellitus with hyperglycemia: Secondary | ICD-10-CM

## 2021-07-23 DIAGNOSIS — N2889 Other specified disorders of kidney and ureter: Secondary | ICD-10-CM

## 2021-07-23 DIAGNOSIS — N200 Calculus of kidney: Secondary | ICD-10-CM

## 2021-07-23 DIAGNOSIS — G8929 Other chronic pain: Secondary | ICD-10-CM | POA: Insufficient documentation

## 2021-07-23 DIAGNOSIS — M25511 Pain in right shoulder: Secondary | ICD-10-CM

## 2021-07-23 DIAGNOSIS — F1491 Cocaine use, unspecified, in remission: Secondary | ICD-10-CM | POA: Insufficient documentation

## 2021-07-23 DIAGNOSIS — D235 Other benign neoplasm of skin of trunk: Secondary | ICD-10-CM

## 2021-07-23 LAB — GLUCOSE, POCT (MANUAL RESULT ENTRY): POC Glucose: 210 mg/dl — AB (ref 70–99)

## 2021-07-23 MED ORDER — MELOXICAM 15 MG PO TABS
15.0000 mg | ORAL_TABLET | Freq: Every day | ORAL | 1 refills | Status: DC
  Filled 2021-07-23: qty 30, 30d supply, fill #0
  Filled 2021-08-23: qty 30, 30d supply, fill #1

## 2021-07-23 MED ORDER — AMLODIPINE BESYLATE 10 MG PO TABS
10.0000 mg | ORAL_TABLET | Freq: Every day | ORAL | 6 refills | Status: DC
  Filled 2021-07-23: qty 30, 30d supply, fill #0
  Filled 2021-08-23: qty 30, 30d supply, fill #1

## 2021-07-23 MED ORDER — GLIPIZIDE ER 10 MG PO TB24
10.0000 mg | ORAL_TABLET | Freq: Every day | ORAL | 6 refills | Status: DC
  Filled 2021-07-23: qty 30, 30d supply, fill #0
  Filled 2021-08-23: qty 30, 30d supply, fill #1

## 2021-07-23 NOTE — Assessment & Plan Note (Signed)
Status post ear lavage we will follow-up in the clinic may need ENT ?

## 2021-07-23 NOTE — Assessment & Plan Note (Signed)
Referral to surgery made ?

## 2021-07-23 NOTE — Assessment & Plan Note (Signed)
Patient needs follow-up with urology ?

## 2021-07-23 NOTE — Assessment & Plan Note (Signed)
Renal stones with partial hydronephrosis right kidney plan is to have patient follow-up with urology ?

## 2021-07-23 NOTE — Patient Instructions (Signed)
Begin meloxicam 1 daily ? ?Get your methocarbamol filled ? ?Refills on Glucotrol amlodipine were sent to the pharmacy downstairs ? ?Make sure you are taking 24 units of the insulin glargine once a day in the morning ? ?Stay on the insulin lispro Humalog 5 units twice a day at lunch and dinner ? ?Try to follow a healthier diet if you can with more vegetables in your diet ? ?Your ears were washed out at this visit continue to use the Debrox we gave you at the shelter in your ears daily ? ?Referral to orthopedic surgery made for your right shoulder arthritis and referral back to [facility] of urology was made for your kidney stones and mass on the kidney ? ?Return to see Dr. Margarita Rana for follow-up in 2 months ?

## 2021-07-23 NOTE — Assessment & Plan Note (Signed)
Uncontrolled type 2 diabetes with hyperglycemia ? ?Plan is to increase insulin glargine 24 units daily continue Humalog 5 units twice daily continue metformin and Jardiance as prescribed ? ?Patient return in short-term follow-up with clinical pharmacy ?

## 2021-07-23 NOTE — Assessment & Plan Note (Signed)
Blood pressure at goal no changes made 

## 2021-07-25 ENCOUNTER — Other Ambulatory Visit: Payer: Self-pay

## 2021-07-25 ENCOUNTER — Encounter: Payer: Self-pay | Admitting: Critical Care Medicine

## 2021-07-25 ENCOUNTER — Other Ambulatory Visit: Payer: Self-pay | Admitting: Critical Care Medicine

## 2021-07-25 MED ORDER — INSULIN GLARGINE SOLOSTAR 100 UNIT/ML ~~LOC~~ SOPN
24.0000 [IU] | PEN_INJECTOR | Freq: Every day | SUBCUTANEOUS | 2 refills | Status: DC
  Filled 2021-07-25: qty 15, 62d supply, fill #0
  Filled 2021-08-16 – 2021-09-19 (×2): qty 15, 62d supply, fill #1

## 2021-07-26 ENCOUNTER — Other Ambulatory Visit: Payer: Self-pay

## 2021-07-26 NOTE — Progress Notes (Signed)
Is a 61 year old male primary care patient of Dr. Margarita Rana here at the [facility] and we see him today for follow-up.  On exam blood pressure 172/98 blood sugar is 120 I did examine his ears he has improvement in both ears with regards to cerumen impaction.  He does have Debrox he will continue to use this to clear the ears out.  Unfortunately he was rejected for evaluation at [facility] surgery because he has been no showing on his appointments and will not answer his phone.  He is nearly out of insulin.  He wasted a fair amount of insulin and one of his pens when he did not change the needles out the needle was bent and that leaked around the device when he was self administering.  Patient does have an upcoming appointment with orthopedics which he will attend.  Plan is to obtain new insulin for this patient and we will try to see how we can get him into general surgery for the sebaceous cyst on his upper back.  I also gave the patient a supply of hydrocortisone cream for the back of his neck due to a rash that is present

## 2021-07-31 ENCOUNTER — Ambulatory Visit: Payer: Medicaid Other | Admitting: Orthopaedic Surgery

## 2021-08-11 ENCOUNTER — Emergency Department (HOSPITAL_COMMUNITY)
Admission: EM | Admit: 2021-08-11 | Discharge: 2021-08-11 | Disposition: A | Discharge status: Home / Self Care | Payer: Medicaid Other | Attending: Emergency Medicine | Admitting: Emergency Medicine

## 2021-08-11 ENCOUNTER — Emergency Department (HOSPITAL_COMMUNITY): Payer: Medicaid Other

## 2021-08-11 ENCOUNTER — Encounter (HOSPITAL_COMMUNITY): Payer: Self-pay

## 2021-08-11 ENCOUNTER — Other Ambulatory Visit: Payer: Self-pay

## 2021-08-11 DIAGNOSIS — R319 Hematuria, unspecified: Secondary | ICD-10-CM | POA: Diagnosis not present

## 2021-08-11 DIAGNOSIS — R7989 Other specified abnormal findings of blood chemistry: Secondary | ICD-10-CM | POA: Diagnosis not present

## 2021-08-11 DIAGNOSIS — D72829 Elevated white blood cell count, unspecified: Secondary | ICD-10-CM | POA: Insufficient documentation

## 2021-08-11 DIAGNOSIS — R1030 Lower abdominal pain, unspecified: Secondary | ICD-10-CM | POA: Insufficient documentation

## 2021-08-11 DIAGNOSIS — Z79899 Other long term (current) drug therapy: Secondary | ICD-10-CM | POA: Insufficient documentation

## 2021-08-11 DIAGNOSIS — Z794 Long term (current) use of insulin: Secondary | ICD-10-CM | POA: Insufficient documentation

## 2021-08-11 LAB — URINALYSIS, ROUTINE W REFLEX MICROSCOPIC
Bacteria, UA: NONE SEEN
Bilirubin Urine: NEGATIVE
Glucose, UA: >=500 mg/dL — AB
Ketones, ur: NEGATIVE mg/dL
Nitrite: NEGATIVE
Protein, ur: 100 mg/dL — AB
RBC / HPF: >50 RBC/hpf — ABNORMAL HIGH (ref 0–5)
Specific Gravity, Urine: 1.02 (ref 1.005–1.030)
pH: 5 (ref 5.0–8.0)

## 2021-08-11 LAB — BASIC METABOLIC PANEL
Anion gap: 9 (ref 5–15)
BUN: 20 mg/dL (ref 8–23)
CO2: 23 mmol/L (ref 22–32)
Calcium: 9.1 mg/dL (ref 8.9–10.3)
Chloride: 107 mmol/L (ref 98–111)
Creatinine, Ser: 1.35 mg/dL — ABNORMAL HIGH (ref 0.61–1.24)
GFR, Estimated: 60 mL/min — ABNORMAL LOW (ref >60)
Glucose, Bld: 109 mg/dL — ABNORMAL HIGH (ref 70–99)
Potassium: 3.6 mmol/L (ref 3.5–5.1)
Sodium: 139 mmol/L (ref 135–145)

## 2021-08-11 LAB — CBC
HCT: 40.2 % (ref 39.0–52.0)
Hemoglobin: 12.8 g/dL — ABNORMAL LOW (ref 13.0–17.0)
MCH: 31.2 pg (ref 26.0–34.0)
MCHC: 31.8 g/dL (ref 30.0–36.0)
MCV: 98 fL (ref 80.0–100.0)
Platelets: 340 10*3/uL (ref 150–400)
RBC: 4.1 MIL/uL — ABNORMAL LOW (ref 4.22–5.81)
RDW: 13.4 % (ref 11.5–15.5)
WBC: 12.7 10*3/uL — ABNORMAL HIGH (ref 4.0–10.5)
nRBC: 0 % (ref 0.0–0.2)

## 2021-08-11 NOTE — Discharge Instructions (Signed)
Your work-up today was reassuring.  CT scan did not show any concerning cause of your blood in the urine.  Your urine was sent for culture to evaluate and make sure there is no urine infection.  If this returns positive you will receive a phone call and you will be treated at that time.  Recommend you follow-up with your primary care provider and urology.  I have attached urology referral above for you.  If any worsening symptoms you can return to the emergency room.

## 2021-08-11 NOTE — ED Provider Notes (Signed)
61 year old male presents today for evaluation of 2-week duration of hematuria.  At signout patient is awaiting CT renal stone study.  Work-up so far reveals CBC with mild leukocytosis, hemoglobin of 12.8.  Vital stable.  Without concern for significant blood loss anemia.  BMP with mild renal insufficiency otherwise electrolytes within normal limits.  UA shows large hemoglobin, moderate amount of leukocytes otherwise without significant concern for UTI.  Urine culture ordered.  Physical Exam  BP (!) 152/90   Pulse 90   Temp 98 F (36.7 C) (Oral)   Resp 18   Ht '5\' 8"'$  (1.727 m) Comment: Simultaneous filing. User may not have seen previous data.  Wt 70.3 kg Comment: Simultaneous filing. User may not have seen previous data.  SpO2 92%   BMI 23.57 kg/m     Procedures  Procedures  ED Course / MDM    Medical Decision Making Amount and/or Complexity of Data Reviewed Labs: ordered. Radiology: ordered.   CT renal stone study without acute findings concerning for patient's symptoms.  Urology referral given.  Return precautions discussed.  Discussed follow-up with PCP.  Patient voices understanding and is in agreement with plan.       Evlyn Courier, PA-C 08/11/21 0830    Milton Ferguson, MD 08/11/21 1730

## 2021-08-11 NOTE — ED Notes (Signed)
Patient attempted providing a urine sample. Unsuccessful.

## 2021-08-11 NOTE — ED Provider Notes (Signed)
[place] DEPT Provider Note   CSN: [id number] Arrival date & time: 08/11/21  0865     History  Chief Complaint  Patient presents with   Hematuria    David Wyatt is a 61 y.o. male homeless man who presents from shelter with concern for "rotor cup  pain for a long time", as well as concern that he has been urinating blood.  When asked for the duration of his hematuria he had for states that it just started, then subsequently states that he has never seen any blood in his urine, then subsequently states that he has had blood in his urine for the last month.  Unclear the duration of his symptoms.  Does also endorse pain in his lower abdomen when his bladder is moderately full and discomfort when he urinates.  I personally reviewed this patient's medical records.  He has history of hypertension, type 2 diabetes, polysubstance abuse.  Level 5 caveat due to patient's poor cooperation with the interview. Exceedingly difficult keep the patient engaged in discussing the problem at hand.  HPI     Home Medications Prior to Admission medications   Medication Sig Start Date End Date Taking? Authorizing Provider  DULoxetine (CYMBALTA) 60 MG capsule Take 1 capsule (60 mg total) by mouth daily. 03/22/21  Yes Mayers, Cari S, PA-C  empagliflozin (JARDIANCE) 10 MG TABS tablet Take 1 tablet (10 mg total) by mouth daily before breakfast. 07/18/21  Yes Elsie Stain, MD  gabapentin (NEURONTIN) 300 MG capsule Take 2 capsules (600 mg total) by mouth 3 (three) times daily. 03/22/21  Yes Mayers, Cari S, PA-C  glipiZIDE (GLUCOTROL XL) 10 MG 24 hr tablet Take 1 tablet (10 mg total) by mouth daily with breakfast. 07/23/21  Yes Elsie Stain, MD  Insulin Glargine Solostar (LANTUS) 100 UNIT/ML Solostar Pen Inject 24 Units into the skin daily. 07/25/21  Yes Elsie Stain, MD  insulin lispro (HUMALOG) 100 UNIT/ML KwikPen Inject 5 Units into the skin 2 (two) times daily before  lunch and supper. 07/18/21  Yes Elsie Stain, MD  meloxicam (MOBIC) 15 MG tablet Take 1 tablet (15 mg total) by mouth daily. 07/23/21  Yes Elsie Stain, MD  metFORMIN (GLUCOPHAGE) 1000 MG tablet Take 1 tablet (1,000 mg total) by mouth 2 (two) times daily. 07/18/21  Yes Elsie Stain, MD  Accu-Chek Softclix Lancets lancets Test blood sugar 2 times daily 03/22/21   Mayers, Cari S, PA-C  Accu-Chek Softclix Lancets lancets Use up to 4 times daily as directed 07/04/21   Barrett, Evelene Croon, PA-C  amLODipine (NORVASC) 10 MG tablet Take 1 tablet (10 mg total) by mouth daily. 07/23/21   Elsie Stain, MD  atorvastatin (LIPITOR) 40 MG tablet Take 1 tablet (40 mg total) by mouth daily. 03/22/21   Mayers, Cari S, PA-C  Blood Glucose Monitoring Suppl (ACCU-CHEK GUIDE) w/Device KIT USE AS DIRECTED TO CHECK BLOOD SUGARS UP TO TWICE PER DAY 07/05/21   [name], MD  carbamide peroxide (DEBROX) 6.5 % OTIC solution Place 5 drops into the right ear 2 (two) times daily. 07/04/21 08/03/22  Barrett, Evelene Croon, PA-C  cyclobenzaprine (FLEXERIL) 10 MG tablet Take 1 tablet (10 mg total) by mouth 2 (two) times daily as needed for muscle spasms. 12/26/20   [name], MD  glucose blood (ACCU-CHEK GUIDE) test strip Use upto 3 times daily as directed 07/05/21   [name], MD  hydrOXYzine (ATARAX) 25 MG tablet Take 1 tablet (25  mg total) by mouth at bedtime. 07/05/21   Elsie Stain, MD  Insulin Pen Needle 31G X 8 MM MISC Use with insulin pen 07/05/21   Elsie Stain, MD  methocarbamol (ROBAXIN) 500 MG tablet Take 1 tablet (500 mg total) by mouth 3 times/day as needed-between meals & bedtime for muscle spasms. 07/20/21   Harris, Vernie Shanks, PA-C  loratadine (CLARITIN) 10 MG tablet Take 1 tablet (10 mg total) by mouth daily. Patient not taking: No sig reported 06/03/17 07/20/20  [name], MD      Allergies    Lisinopril    Review of Systems   Review of Systems  Genitourinary:  Positive for  hematuria.  Musculoskeletal:  Positive for arthralgias.   Physical Exam Updated Vital Signs BP 129/80   Pulse 77   Temp 98 F (36.7 C) (Oral)   Resp 18   Ht _0  (1.727 m) Comment: Simultaneous filing. User may not have seen previous data.  Wt 70.3 kg Comment: Simultaneous filing. User may not have seen previous data.  SpO2 91%   BMI 23.57 kg/m  Physical Exam Vitals and nursing note reviewed.  HENT:     Head: Normocephalic and atraumatic.     Mouth/Throat:     Mouth: Mucous membranes are moist.     Pharynx: No oropharyngeal exudate or posterior oropharyngeal erythema.  Eyes:     General:        Right eye: No discharge.        Left eye: No discharge.     Conjunctiva/sclera: Conjunctivae normal.  Cardiovascular:     Rate and Rhythm: Normal rate and regular rhythm.     Pulses: Normal pulses.     Heart sounds: Normal heart sounds. No murmur heard. Pulmonary:     Effort: Pulmonary effort is normal. No respiratory distress.     Breath sounds: Normal breath sounds. No wheezing or rales.  Abdominal:     General: Bowel sounds are normal. There is no distension.     Palpations: Abdomen is soft.     Tenderness: There is abdominal tenderness in the suprapubic area. There is no right CVA tenderness, left CVA tenderness, guarding or rebound.  Musculoskeletal:        General: No deformity.     Cervical back: Neck supple.  Skin:    General: Skin is warm and dry.     Capillary Refill: Capillary refill takes less than 2 seconds.  Neurological:     Mental Status: He is alert. Mental status is at baseline.     GCS: GCS eye subscore is 3. GCS verbal subscore is 4. GCS motor subscore is 6.  Psychiatric:        Mood and Affect: Mood normal.    ED Results / Procedures / Treatments   Labs (all labs ordered are listed, but only abnormal results are displayed) Labs Reviewed  BASIC METABOLIC PANEL - Abnormal; Notable for the following components:      Result Value   Glucose, Bld 109 (*)     Creatinine, Ser 1.35 (*)    GFR, Estimated 60 (*)    All other components within normal limits  CBC - Abnormal; Notable for the following components:   WBC 12.7 (*)    RBC 4.10 (*)    Hemoglobin 12.8 (*)    All other components within normal limits  URINALYSIS, ROUTINE W REFLEX MICROSCOPIC - Abnormal; Notable for the following components:   APPearance CLOUDY (*)    Glucose, UA >=  500 (*)    Hgb urine dipstick LARGE (*)    Protein, ur 100 (*)    Leukocytes,Ua MODERATE (*)    RBC / HPF >50 (*)    All other components within normal limits    EKG None  Radiology No results found.  Procedures Procedures   Medications Ordered in ED Medications - No data to display  ED Course/ Medical Decision Making/ A&P                           Medical Decision Making 61 year old male presents with concern for hematuria times suspected 2 weeks.  Mild lower abdominal pain  with urination.  Hypertensive and mildly tachycardic intake, vital signs normalized subsequently.  Cardiopulmonary exam is normal, abdominal exam with mild suprapubic tenderness palpation.  Neurovascular intact in all extremities.  Does not appear to be responding to internal stimuli at this time.   Amount and/or Complexity of Data Reviewed Labs: ordered.    Details: CBC with leukocytosis of 12.7, anemia with hemoglobin of 12.8 decreased from patient's baseline near 13.  BMP with mild elevated creatinine to 1.3 from patient's baseline near 1.  UA with large amount of hemoglobin, proteinuria, no bacteria , WBC 11-2 white count.   Patient signed out to oncoming ED provider A. Ali PA-C at time of shift change, pending CT renal study for further evaluation of hematuria.   Alexavier voiced understanding of her medical evaluation and treatment plan. Each of their questions answered to their expressed satisfaction.    This chart was dictated using voice recognition software, Dragon. Despite the best efforts of this provider to  proofread and correct errors, errors may still occur which can change documentation meaning.  Final Clinical Impression(s) / ED Diagnoses Final diagnoses:  None    Rx / DC Orders ED Discharge Orders     None         Aura Dials 08/11/21 0708    Merryl Hacker, MD 08/11/21 2256

## 2021-08-11 NOTE — ED Triage Notes (Addendum)
Patient has been urinating blood for a couple days. Said there were no clots in urine. Also stated he is diabetic, has not eaten all day, but got a cinnamon bun and coke in the lobby and was eating it now.

## 2021-08-12 LAB — URINE CULTURE: Culture: NO GROWTH

## 2021-08-14 ENCOUNTER — Ambulatory Visit: Payer: Medicaid Other | Admitting: Orthopaedic Surgery

## 2021-08-15 ENCOUNTER — Encounter: Payer: Self-pay | Admitting: Physician Assistant

## 2021-08-15 ENCOUNTER — Other Ambulatory Visit (HOSPITAL_COMMUNITY): Payer: Self-pay

## 2021-08-15 ENCOUNTER — Other Ambulatory Visit: Payer: Self-pay | Admitting: Critical Care Medicine

## 2021-08-15 MED ORDER — NEOMYCIN-POLYMYXIN-HC 1 % OT SOLN
3.0000 [drp] | Freq: Four times a day (QID) | OTIC | 0 refills | Status: DC
  Filled 2021-08-15: qty 10, 8d supply, fill #0
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
## 2021-08-16 ENCOUNTER — Other Ambulatory Visit: Payer: Self-pay | Admitting: Critical Care Medicine

## 2021-08-16 ENCOUNTER — Other Ambulatory Visit (HOSPITAL_COMMUNITY): Payer: Self-pay

## 2021-08-16 ENCOUNTER — Telehealth: Payer: Self-pay | Admitting: Family Medicine

## 2021-08-16 DIAGNOSIS — N200 Calculus of kidney: Secondary | ICD-10-CM

## 2021-08-16 DIAGNOSIS — R310 Gross hematuria: Secondary | ICD-10-CM

## 2021-08-16 DIAGNOSIS — H6122 Impacted cerumen, left ear: Secondary | ICD-10-CM

## 2021-08-16 NOTE — Telephone Encounter (Signed)
Patient seen at the shelter by Dr. Joya Gaskins with recommendations for ENT and urology referral.

## 2021-08-21 ENCOUNTER — Ambulatory Visit (INDEPENDENT_AMBULATORY_CARE_PROVIDER_SITE_OTHER): Payer: Medicaid Other | Admitting: Physician Assistant

## 2021-08-21 ENCOUNTER — Telehealth: Payer: Self-pay | Admitting: Family Medicine

## 2021-08-21 ENCOUNTER — Other Ambulatory Visit: Payer: Self-pay

## 2021-08-21 DIAGNOSIS — G8929 Other chronic pain: Secondary | ICD-10-CM

## 2021-08-21 DIAGNOSIS — M25511 Pain in right shoulder: Secondary | ICD-10-CM

## 2021-08-21 MED ORDER — TRAMADOL HCL 50 MG PO TABS
50.0000 mg | ORAL_TABLET | Freq: Two times a day (BID) | ORAL | 2 refills | Status: DC | PRN
  Filled 2021-08-21: qty 10, 5d supply, fill #0

## 2021-08-21 NOTE — Telephone Encounter (Signed)
Pt was called and he states that he was seen in the ED for blood in his urine and wanted to know the results of all lab work. Patient was informed that he has been referred to Urology, attempted to give patient the phone number so that he can call and make an appointment, patient did not want information to contact office.

## 2021-08-21 NOTE — Progress Notes (Signed)
Office Visit Note   Patient: David Wyatt           Date of Birth: [birth year]           MRN: [id number] Visit Date: 08/21/2021              Requested by: Elsie Stain, MD [address] [place],  [place] [id number] PCP: [name], MD   Assessment & Plan: Visit Diagnoses:  1. Chronic right shoulder pain     Plan: Impression is right shoulder glenohumeral joint osteoarthritis.  At this point, would like to make a referral to Dr. Ernestina Patches for intra-articular cortisone injection.  Have also sent in a prescription of tramadol.  He will follow-up with [place] as needed.  Call with concerns or questions.  Follow-Up Instructions: Return if symptoms worsen or fail to improve.   Orders:  Orders Placed This Encounter  Procedures   Ambulatory referral to Physical Medicine Rehab   No orders of the defined types were placed in this encounter.     Procedures: No procedures performed   Clinical Data: No additional findings.   Subjective: Chief Complaint  Patient presents with   Right Shoulder - Pain    HPI patient is a pleasant 61 year old gentleman who comes in today with right shoulder pain for the past few months.  He denies any injury or change in activity but notes her symptoms have progressively worsened.  The pain he has is to the entire aspect of the right shoulder.  Lying on the right side as well as shoulder abduction seems to worsen his symptoms.  He notes slight weakness to the right shoulder.  He does not take any medication for this.  He does note paresthesias to the right hand.  No previous cortisone injection to the right shoulder.  Review of Systems as detailed in HPI.  All others reviewed and are negative.   Objective: Vital Signs: There were no vitals taken for this visit.  Physical Exam well-developed well-nourished gentleman in no acute distress.  Alert and oriented x3.  Ortho Exam right shoulder exam reveals forward flexion to approximately  150 degrees.  Internal rotation to L5.  About 80 degrees of external rotation.  He does not have pain with empty can test but does have a positive speeds and O'Brien's test.  Minimal tenderness to the [facility] joint.  He is neurovascularly intact distally.  Specialty Comments:  No specialty comments available.  Imaging: X-rays reviewed by me in canopy show moderate degenerative changes the glenohumeral joint   PMFS History: Patient Active Problem List   Diagnosis Date Noted   History of cocaine use 07/23/2021   Chronic right shoulder pain 07/23/2021   Renal stones 07/23/2021   Dermoid cyst of skin of back 07/23/2021   Bilateral impacted cerumen 07/23/2021   Uncontrolled type 2 diabetes mellitus with hyperglycemia ([place]) 03/22/2021   Left kidney mass 03/22/2021   Hydronephrosis of right kidney 02/23/2021   Tobacco abuse 08/19/2018   Hyperlipidemia 04/24/2017   GERD (gastroesophageal reflux disease) 06/13/2015   Diabetic neuropathy ([place]) 05/01/2015   Essential hypertension, benign 11/01/2013   Lumbar disc herniation 09/13/2013   Unspecified constipation 09/13/2013   Chronic back pain 09/13/2013   Felon of finger of left hand with lymphangitis 12/13/2012   Crack cocaine use 07/29/2012   Arthritis 07/29/2012   Weakness generalized 07/29/2012   Past Medical History:  Diagnosis Date   Angioedema of lips 07/28/2012   left upper (07/29/2012)  Arthritis    hands and back   Chronic back pain    Cocaine abuse ([facility])    Diabetic neuropathy ([facility])    High cholesterol    Hypertension    Neuropathy    Type II diabetes mellitus ([place])     Family History  Problem Relation Age of Onset   Diabetes Mother    Kidney disease Mother    Hyperlipidemia Mother    Hyperlipidemia Father    Diabetes Father     Past Surgical History:  Procedure Laterality Date   BACK SURGERY     CYSTOSCOPY W/ URETERAL STENT PLACEMENT Right 02/23/2021   Procedure: CYSTOSCOPY WITH RETROGRADE PYELOGRAM/URETERAL STENT  PLACEMENT;  Surgeon: Janith Lima, MD;  Location: [facility];  Service: Urology;  Laterality: Right;   HERNIA REPAIR Right 04/01/2012   I & D EXTREMITY Left 12/13/2012   Procedure: IRRIGATION AND DEBRIDEMENT LEFT THUMB;  Surgeon: Tennis Must, MD;  Location: [facility];  Service: Orthopedics;  Laterality: Left;   INCISION AND DRAINAGE OF WOUND     boil on back/notes 07/14/2008 (07/29/2012)   INGUINAL HERNIA REPAIR  04/01/2012   Procedure: HERNIA REPAIR INGUINAL ADULT;  Surgeon: Haywood Lasso, MD;  Location: [facility];  Service: General;  Laterality: Right;   INGUINAL HERNIA REPAIR Left 09/06/2016   Procedure: OPEN REPAIR LEFT INGUINAL HERNIA;  Surgeon: Greer Pickerel, MD;  Location: [facility];  Service: General;  Laterality: Left;   INSERTION OF MESH Left 09/06/2016   Procedure: INSERTION OF MESH;  Surgeon: Greer Pickerel, MD;  Location: [facility];  Service: General;  Laterality: Left;   TONSILLECTOMY     Social History   Occupational History    Comment: disabled  Tobacco Use   Smoking status: Every Day    Packs/day: 0.25    Years: 30.00    Total pack years: 7.50    Types: Cigarettes   Smokeless tobacco: Former    Quit date: 08/15/2015   Tobacco comments:    04/29/16 2  cigs daily, 10/27/17 sometimes < .25 PPD  Vaping Use   Vaping Use: Never used  Substance and Sexual Activity   Alcohol use: No    Alcohol/week: 0.0 standard drinks of alcohol   Drug use: Not Currently    Types: "Crack" cocaine   Sexual activity: Not on file

## 2021-08-21 NOTE — Telephone Encounter (Signed)
Copied from [place] [id number]. Topic: General - Other >> Aug 20, 2021  5:30 PM Ja-Kwan M wrote: Reason for CRM: Pt called for an update and asked to be contacted to advise him of the next step

## 2021-08-21 NOTE — Telephone Encounter (Signed)
Patient requesting a call back for 08-11-2021 lab results done at [facility]  Please follow up w/ patient

## 2021-08-22 ENCOUNTER — Encounter: Payer: Self-pay | Admitting: Physician Assistant

## 2021-08-22 NOTE — Telephone Encounter (Signed)
Called pt made aware of MD message. Appt given

## 2021-08-22 NOTE — Progress Notes (Signed)
Pt has been itching all over. He has lesions in various stages of healing easily visible on his R arm. He also has other lesions on his legs and back. He says they have been going on for about a week.   He has no medicine. He has been having trouble getting in with Dr Margarita Rana, but she has a phone note in the chart dated 06/13, regarding the need for him to see Urology and f/u with her.   The Orthopedic PA saw him 06/13 and sent in rx for Tramadol. She also recommends Dr Ernestina Patches do an injection.   He has a phone: [phone]  143/88, 76, 94%  CBG 203 Numerous bites on legs as well as arms, no signs of infection. His toenails are unevenly trimmed, but not long enough to cause problems.    He is out of the meds listed below. Refills were ordered by Dr Joya Gaskins.  Prior to Admission medications   Medication Sig Start Date End Date Taking? Authorizing Provider  amLODipine (NORVASC) 10 MG tablet Take 1 tablet (10 mg total) by mouth daily. 07/23/21   Elsie Stain, MD  atorvastatin (LIPITOR) 40 MG tablet Take 1 tablet (40 mg total) by mouth daily. 03/22/21   Mayers, Cari S, PA-C  carbamide peroxide (DEBROX) 6.5 % OTIC solution Place 5 drops into the right ear 2 (two) times daily. 07/04/21 08/03/22  Trusten Hume, Evelene Croon, PA-C  cyclobenzaprine (FLEXERIL) 10 MG tablet Take 1 tablet (10 mg total) by mouth 2 (two) times daily as needed for muscle spasms. 12/26/20   [name], MD  DULoxetine (CYMBALTA) 60 MG capsule Take 1 capsule (60 mg total) by mouth daily. 03/22/21   Mayers, Cari S, PA-C  empagliflozin (JARDIANCE) 10 MG TABS tablet Take 1 tablet (10 mg total) by mouth daily before breakfast. 07/18/21   Elsie Stain, MD  gabapentin (NEURONTIN) 300 MG capsule Take 2 capsules (600 mg total) by mouth 3 (three) times daily. 03/22/21   Mayers, Cari S, PA-C  glipiZIDE (GLUCOTROL XL) 10 MG 24 hr tablet Take 1 tablet (10 mg total) by mouth daily with breakfast. 07/23/21   Elsie Stain, MD  hydrOXYzine  (ATARAX) 25 MG tablet Take 1 tablet (25 mg total) by mouth at bedtime. 07/05/21   Elsie Stain, MD  insulin lispro (HUMALOG) 100 UNIT/ML KwikPen Inject 5 Units into the skin 2 (two) times daily before lunch and supper. 07/18/21   Elsie Stain, MD  meloxicam (MOBIC) 15 MG tablet Take 1 tablet (15 mg total) by mouth daily. 07/23/21   Elsie Stain, MD  metFORMIN (GLUCOPHAGE) 1000 MG tablet Take 1 tablet (1,000 mg total) by mouth 2 (two) times daily. 07/18/21   Elsie Stain, MD  methocarbamol (ROBAXIN) 500 MG tablet Take 1 tablet (500 mg total) by mouth 3 times/day as needed-between meals & bedtime for muscle spasms. 07/20/21   Harris, Abigail, PA-C  NEOMYCIN-POLYMYXIN-HYDROCORTISONE (CORTISPORIN) 1 % SOLN OTIC solution Place 3 drops into both ears 4 (four) times daily. 08/15/21   Elsie Stain, MD  traMADol (ULTRAM) 50 MG tablet Take 1 tablet (50 mg total) by mouth every 12 (twelve) hours as needed. 08/21/21   Aundra Dubin, PA-C  loratadine (CLARITIN) 10 MG tablet Take 1 tablet (10 mg total) by mouth daily. Patient not taking: No sig reported 06/03/17 07/20/20  [name], MD   Dr Joya Gaskins will work on the Urology appt, which was denied for unknown reasons, and the ENT appt.  Rosaria Ferries, PA-C 08/22/2021 3:52 PM

## 2021-08-22 NOTE — Telephone Encounter (Signed)
He was seen at the shelter by Dr. Joya Gaskins shortly after his ED visit and that was discussed with him.  I received this report from Dr. Joya Gaskins with request for urology referral due to finding of kidney stones which is most likely cause of his blood in urine. Urine culture did not grow any bacteria, kidney function was slightly abnormal. Bottom line is he needs to see urology.  He also needs to schedule a follow-up visit with me in the clinic as he is due for his routine follow-up for his chronic medical conditions and kidney function.

## 2021-08-23 ENCOUNTER — Telehealth: Payer: Self-pay | Admitting: Critical Care Medicine

## 2021-08-23 ENCOUNTER — Other Ambulatory Visit: Payer: Self-pay

## 2021-08-23 NOTE — Telephone Encounter (Signed)
Thanks Dr. Joya Gaskins.  Please see my message from 08/22/2021 where I indicated the need for an office visit for chronic disease management. The message has already been conveyed to him by nursing staff.

## 2021-08-23 NOTE — Telephone Encounter (Signed)
Dr Margarita Rana  and David Wyatt.     The patient does not hear well and does not respond on his phone.  Ty for sending in his referrals for urology and ENT    I cannot tell if they have called him as of yet.  He sees me weekly at the shelter and I encourage him to see his PCP . We try to only do medication bridging and try to avoid any chronic dz management of clients at [facility] if they have PCPs  He is coming to pick up refills of his meds today at [facility] pharm     He did not know how to call to get meds refilled  Any way you can f/u on his appts with [facility] and ENT?  He did see ortho for shoulder pain this week and they plan injections on shoulder and sent tramadol to [facility] pharmacy  Also he would benefit from a PCP f/u OV in near future in next 1- 2 months

## 2021-08-27 ENCOUNTER — Other Ambulatory Visit: Payer: Self-pay

## 2021-08-28 ENCOUNTER — Ambulatory Visit: Payer: Self-pay

## 2021-08-28 ENCOUNTER — Ambulatory Visit (INDEPENDENT_AMBULATORY_CARE_PROVIDER_SITE_OTHER): Payer: Medicaid Other | Admitting: Physical Medicine and Rehabilitation

## 2021-08-28 ENCOUNTER — Encounter: Payer: Self-pay | Admitting: Physical Medicine and Rehabilitation

## 2021-08-28 DIAGNOSIS — G8929 Other chronic pain: Secondary | ICD-10-CM | POA: Diagnosis not present

## 2021-08-28 DIAGNOSIS — M25511 Pain in right shoulder: Secondary | ICD-10-CM | POA: Diagnosis not present

## 2021-08-28 NOTE — Progress Notes (Unsigned)
Pt state right shoulder pain. Pt state any movement with his right arm makes the pain worse. Pt state he takes over the counter pain meds to help ease his pain.  Numeric Pain Rating Scale and Functional Assessment Average Pain 7   In the last MONTH (on 0-10 scale) has pain interfered with the following?  1. General activity like being  able to carry out your everyday physical activities such as walking, climbing stairs, carrying groceries, or moving a chair?  Rating(10)   +Driver, -BT, -Dye Allergies.

## 2021-08-29 ENCOUNTER — Encounter: Payer: Medicaid Other | Admitting: Physical Medicine and Rehabilitation

## 2021-09-03 ENCOUNTER — Telehealth: Payer: Self-pay | Admitting: Physical Medicine and Rehabilitation

## 2021-09-03 MED ORDER — BUPIVACAINE HCL 0.25 % IJ SOLN
5.0000 mL | INTRAMUSCULAR | Status: AC | PRN
  Administered 2021-08-28: 5 mL via INTRA_ARTICULAR

## 2021-09-03 MED ORDER — TRIAMCINOLONE ACETONIDE 40 MG/ML IJ SUSP
40.0000 mg | INTRAMUSCULAR | Status: AC | PRN
  Administered 2021-08-28: 40 mg via INTRA_ARTICULAR

## 2021-09-03 NOTE — Telephone Encounter (Signed)
David Wyatt is calling to schedule another injection in his R shoulder. Please advise

## 2021-09-03 NOTE — Progress Notes (Signed)
   David Wyatt - 61 y.o. male MRN [id number]  Date of birth: 1960/05/11  Office Visit Note: Visit Date: 08/28/2021 PCP: Hoy Register, MD Referred by: Hoy Register, MD  Subjective: Chief Complaint  Patient presents with   Right Shoulder - Pain   HPI:  David Wyatt is a 61 y.o. male who comes in today at the request of Tessa Lerner, PA-C for planned Right anesthetic glenohumeral arthrogram with fluoroscopic guidance.  The patient has failed conservative care including home exercise, medications, time and activity modification.  This injection will be diagnostic and hopefully therapeutic.  Please see requesting physician notes for further details and justification.    ROS Otherwise per HPI.  Assessment & Plan: Visit Diagnoses:    ICD-10-CM   1. Chronic right shoulder pain  M25.511 XR C-ARM NO REPORT   G89.29       Plan: No additional findings.   Meds & Orders: No orders of the defined types were placed in this encounter.   Orders Placed This Encounter  Procedures   Large Joint Inj   XR C-ARM NO REPORT    Follow-up: Return for visit to requesting provider as needed.   Procedures: Large Joint Inj: R glenohumeral on 08/28/2021 3:00 PM Indications: pain and diagnostic evaluation Details: 22 G 3.5 in needle, fluoroscopy-guided anteromedial approach  Arthrogram: No  Medications: 40 mg triamcinolone acetonide 40 MG/ML; 5 mL bupivacaine 0.25 % Outcome: tolerated well, no immediate complications  There was excellent flow of contrast producing a partial arthrogram of the glenohumeral joint. The patient did have relief of symptoms during the anesthetic phase of the injection. Procedure, treatment alternatives, risks and benefits explained, specific risks discussed. Consent was given by the patient. Immediately prior to procedure a time out was called to verify the correct patient, procedure, equipment, support staff and site/side marked as required. Patient was prepped and  draped in the usual sterile fashion.          Clinical History: No specialty comments available.     Objective:  VS:  HT:    WT:   BMI:     BP:   HR: bpm  TEMP: ( )  RESP:  Physical Exam   Imaging: No results found.

## 2021-09-05 ENCOUNTER — Ambulatory Visit: Payer: Medicaid Other | Admitting: Physician Assistant

## 2021-09-05 ENCOUNTER — Other Ambulatory Visit: Payer: Self-pay

## 2021-09-05 ENCOUNTER — Encounter: Payer: Self-pay | Admitting: Physician Assistant

## 2021-09-05 VITALS — BP 122/81 | HR 88 | Resp 18 | Ht 68.0 in | Wt 170.0 lb

## 2021-09-05 DIAGNOSIS — H6123 Impacted cerumen, bilateral: Secondary | ICD-10-CM

## 2021-09-05 DIAGNOSIS — G8929 Other chronic pain: Secondary | ICD-10-CM

## 2021-09-05 DIAGNOSIS — M1990 Unspecified osteoarthritis, unspecified site: Secondary | ICD-10-CM | POA: Diagnosis not present

## 2021-09-05 DIAGNOSIS — M25511 Pain in right shoulder: Secondary | ICD-10-CM | POA: Diagnosis not present

## 2021-09-05 MED ORDER — MELOXICAM 15 MG PO TABS
15.0000 mg | ORAL_TABLET | Freq: Every day | ORAL | 1 refills | Status: DC
  Filled 2021-09-05: qty 30, 30d supply, fill #0

## 2021-09-05 NOTE — Progress Notes (Signed)
Pt seen by Dr Joya Gaskins.  Ear MD has received the referral, has not been contacted.   Because of his back, when he walks, he is bent over.  The pain goes down both legs.   He has 5/5 strength to leg lift and leg straighten. He has tenderness to R lumbar region. Lipoma on upper mid-back, no change in size.   Has ortho appt 07/06 at 9:15  Has PCP appt 07/17 at 2:30 pm  [phone] >> number put in system  CBG 131 today Today's Vitals   09/05/21 1602  BP: 127/85  Pulse: 89  SpO2: 93%   Ayron Fillinger, PA-C 09/05/2021 4:05 PM   There is no height or weight on file to calculate BMI.

## 2021-09-05 NOTE — Patient Instructions (Addendum)
I sent a refill of the mobic to your pharmacy.  This is the medication the Dr. Joya Gaskins prescribed for you to help with your arthritic pain.  Your referral was sent to   [facility] ph. # [place]   Please call them to schedule your appointment for ear wax removal.  I hope that you feel better soon, please let us know if there is anything else we can do for you.  Kennieth Rad, PA-C Physician Assistant [place] [email or web address]   Earwax Buildup, Adult The ears produce a substance called earwax that helps keep bacteria out of the ear and protects the skin in the ear canal. Occasionally, earwax can build up in the ear and cause discomfort or hearing loss. What are the causes? This condition is caused by a buildup of earwax. Ear canals are self-cleaning. Ear wax is made in the outer part of the ear canal and generally falls out in small amounts over time. When the self-cleaning mechanism is not working, earwax builds up and can cause decreased hearing and discomfort. Attempting to clean ears with cotton swabs can push the earwax deep into the ear canal and cause decreased hearing and pain. What increases the risk? This condition is more likely to develop in people who: Clean their ears often with cotton swabs. Pick at their ears. Use earplugs or in-ear headphones often, or wear hearing aids. The following factors may also make you more likely to develop this condition: Being male. Being of older age. Naturally producing more earwax. Having narrow ear canals. Having earwax that is overly thick or sticky. Having excess hair in the ear canal. Having eczema. Being dehydrated. What are the signs or symptoms? Symptoms of this condition include: Reduced or muffled hearing. A feeling of fullness in the ear or feeling that the ear is plugged. Fluid coming from the  ear. Ear pain or an itchy ear. Ringing in the ear. Coughing. Balance problems. An obvious piece of earwax that can be seen inside the ear canal. How is this diagnosed? This condition may be diagnosed based on: Your symptoms. Your medical history. An ear exam. During the exam, your health care provider will look into your ear with an instrument called an otoscope. You may have tests, including a hearing test. How is this treated? This condition may be treated by: Using ear drops to soften the earwax. Having the earwax removed by a health care provider. The health care provider may: Flush the ear with water. Use an instrument that has a loop on the end (curette). Use a suction device. Having surgery to remove the wax buildup. This may be done in severe cases. Follow these instructions at home:  Take over-the-counter and prescription medicines only as told by your health care provider. Do not put any objects, including cotton swabs, into your ear. You can clean the opening of your ear canal with a washcloth or facial tissue. Follow instructions from your health care provider about cleaning your ears. Do not overclean your ears. Drink enough fluid to keep your urine pale yellow. This will help to thin the earwax. Keep all follow-up visits as told. If earwax builds up in your ears often or if you use hearing aids, consider seeing your health care provider for routine, preventive ear cleanings. Ask your health care provider how often you should schedule your cleanings. If you have hearing aids, clean them according to instructions from  the manufacturer and your health care provider. Contact a health care provider if: You have ear pain. You develop a fever. You have pus or other fluid coming from your ear. You have hearing loss. You have ringing in your ears that does not go away. You feel like the room is spinning (vertigo). Your symptoms do not improve with treatment. Get help right  away if: You have bleeding from the affected ear. You have severe ear pain. Summary Earwax can build up in the ear and cause discomfort or hearing loss. The most common symptoms of this condition include reduced or muffled hearing, a feeling of fullness in the ear, or feeling that the ear is plugged. This condition may be diagnosed based on your symptoms, your medical history, and an ear exam. This condition may be treated by using ear drops to soften the earwax or by having the earwax removed by a health care provider. Do not put any objects, including cotton swabs, into your ear. You can clean the opening of your ear canal with a washcloth or facial tissue. This information is not intended to replace advice given to you by your health care provider. Make sure you discuss any questions you have with your health care provider. Document Revised: 06/15/2019 Document Reviewed: 06/15/2019 Elsevier Patient Education  [place].
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
## 2021-09-06 ENCOUNTER — Encounter: Payer: Self-pay | Admitting: *Deleted

## 2021-09-06 NOTE — Congregational Nurse Program (Signed)
Ears continue to cause discomfort, has appt at [facility] 09/24/2021 at 1330 Dr Erlinda Hong 09/13/2021 at 0900 Dr Margarita Rana 7/17  /2023 at 1400 Will call and reschedule PCP appt

## 2021-09-12 ENCOUNTER — Encounter: Payer: Self-pay | Admitting: Physician Assistant

## 2021-09-12 NOTE — Progress Notes (Signed)
Pt seen by Dr Joya Gaskins.  He has a HA, has not seen ENT yet. Has appt w/ [facility] is  July 17th at 1:30   He has not seen Dr Margarita Rana recently, someone here is trying to get him to see someone different.   He has problems w/ numbness in his R arm, could be from his DM, or from his shoulder has known DJD R acromioclavicular joint.   He is otherwise, getting along all right.   He has not been checking his sugars. CBG today 155.   Bilateral tympanic membrane occlusion from wax. Importance of keeping appt w/ ENT emphasized.   Today's Vitals   09/12/21 1507  BP: (!) 144/85  Pulse: 83  SpO2: 98%    Rosaria Ferries, PA-C 09/12/2021 3:15 PM

## 2021-09-13 ENCOUNTER — Encounter: Payer: Self-pay | Admitting: Orthopaedic Surgery

## 2021-09-13 ENCOUNTER — Ambulatory Visit (INDEPENDENT_AMBULATORY_CARE_PROVIDER_SITE_OTHER): Payer: Medicaid Other | Admitting: Orthopaedic Surgery

## 2021-09-13 DIAGNOSIS — M19011 Primary osteoarthritis, right shoulder: Secondary | ICD-10-CM | POA: Diagnosis not present

## 2021-09-13 NOTE — Progress Notes (Signed)
Office Visit Note   Patient: David Wyatt           Date of Birth: 1960/05/24           MRN: [id number] Visit Date: 09/13/2021              Requested by: [name], MD [place],  [place] [address] PCP: [name], MD   Assessment & Plan: Visit Diagnoses:  1. Primary osteoarthritis, right shoulder     Plan: Impression is right shoulder glenohumeral osteoarthritis.  Patient had good but temporary relief following intra-articular cortisone injection.  We have discussed referral to Dr. Marlou Sa for consultation for possible shoulder replacement surgery.  Follow-up with [place] as needed.  Follow-Up Instructions: Return for f/u with Dr. Marlou Sa.   Orders:  No orders of the defined types were placed in this encounter.  No orders of the defined types were placed in this encounter.     Procedures: No procedures performed   Clinical Data: No additional findings.   Subjective: Chief Complaint  Patient presents with   Right Shoulder - Pain, Follow-up    HPI patient is a 61 year old gentleman who comes in today with recurrent right shoulder pain.  He was seen in our office about 3 weeks ago for this.  He was noted at the time he had moderate degenerative changes to the glenohumeral joint was referred to Dr. Ernestina Patches for intra-articular cortisone injection.  He had significant relief following the injection but unfortunately this only lasted for about 3 days.  His symptoms have returned.  Pain is constant and occurs at rest but is aggravated with sleeping.  He has been taking tramadol with mild relief.  Review of Systems as detailed in HPI.  All others reviewed are negative.   Objective: Vital Signs: There were no vitals taken for this visit.  Physical Exam well-developed well-nourished gentleman in no acute distress.  Alert and oriented x3.  Ortho Exam right shoulder exam reveals full active range of motion in all planes.  Minimal pain with empty can  test.  Full strength throughout.  He is neurovascular intact distally.  Specialty Comments:  No specialty comments available.  Imaging: No new imaging   PMFS History: Patient Active Problem List   Diagnosis Date Noted   History of cocaine use 07/23/2021   Chronic right shoulder pain 07/23/2021   Renal stones 07/23/2021   Dermoid cyst of skin of back 07/23/2021   Bilateral impacted cerumen 07/23/2021   Uncontrolled type 2 diabetes mellitus with hyperglycemia ([place]) 03/22/2021   Left kidney mass 03/22/2021   Hydronephrosis of right kidney 02/23/2021   Tobacco abuse 08/19/2018   Hyperlipidemia 04/24/2017   GERD (gastroesophageal reflux disease) 06/13/2015   Diabetic neuropathy ([place]) 05/01/2015   Essential hypertension, benign 11/01/2013   Lumbar disc herniation 09/13/2013   Unspecified constipation 09/13/2013   Chronic back pain 09/13/2013   Felon of finger of left hand with lymphangitis 12/13/2012   Crack cocaine use 07/29/2012   Arthritis 07/29/2012   Weakness generalized 07/29/2012   Past Medical History:  Diagnosis Date   Angioedema of lips 07/28/2012   left upper (07/29/2012)   Arthritis    hands and back   Chronic back pain    Cocaine abuse (HCC)    Diabetic neuropathy (HCC)    High cholesterol    Hypertension    Neuropathy    Type II diabetes mellitus ([place])     Family History  Problem Relation Age of  Onset   Diabetes Mother    Kidney disease Mother    Hyperlipidemia Mother    Hyperlipidemia Father    Diabetes Father     Past Surgical History:  Procedure Laterality Date   BACK SURGERY     CYSTOSCOPY W/ URETERAL STENT PLACEMENT Right 02/23/2021   Procedure: CYSTOSCOPY WITH RETROGRADE PYELOGRAM/URETERAL STENT PLACEMENT;  Surgeon: Janith Lima, MD;  Location: [facility];  Service: Urology;  Laterality: Right;   HERNIA REPAIR Right 04/01/2012   I & D EXTREMITY Left 12/13/2012   Procedure: IRRIGATION AND DEBRIDEMENT LEFT THUMB;  Surgeon: Tennis Must, MD;   Location: No Name;  Service: Orthopedics;  Laterality: Left;   INCISION AND DRAINAGE OF WOUND     boil on back/notes 07/14/2008 (07/29/2012)   INGUINAL HERNIA REPAIR  04/01/2012   Procedure: HERNIA REPAIR INGUINAL ADULT;  Surgeon: Haywood Lasso, MD;  Location: [place];  Service: General;  Laterality: Right;   INGUINAL HERNIA REPAIR Left 09/06/2016   Procedure: OPEN REPAIR LEFT INGUINAL HERNIA;  Surgeon: Greer Pickerel, MD;  Location: [place];  Service: General;  Laterality: Left;   INSERTION OF MESH Left 09/06/2016   Procedure: INSERTION OF MESH;  Surgeon: Greer Pickerel, MD;  Location: [facility];  Service: General;  Laterality: Left;   TONSILLECTOMY     Social History   Occupational History    Comment: disabled  Tobacco Use   Smoking status: Every Day    Packs/day: 0.25    Years: 30.00    Total pack years: 7.50    Types: Cigarettes   Smokeless tobacco: Former    Quit date: 08/15/2015   Tobacco comments:    04/29/16 2  cigs daily, 10/27/17 sometimes < .25 PPD  Vaping Use   Vaping Use: Never used  Substance and Sexual Activity   Alcohol use: No    Alcohol/week: 0.0 standard drinks of alcohol   Drug use: Not Currently    Types: "Crack" cocaine   Sexual activity: Not on file

## 2021-09-19 ENCOUNTER — Other Ambulatory Visit: Payer: Self-pay

## 2021-09-19 ENCOUNTER — Other Ambulatory Visit: Payer: Self-pay | Admitting: *Deleted

## 2021-09-19 ENCOUNTER — Encounter: Payer: Self-pay | Admitting: Physician Assistant

## 2021-09-19 DIAGNOSIS — E1165 Type 2 diabetes mellitus with hyperglycemia: Secondary | ICD-10-CM

## 2021-09-19 DIAGNOSIS — G8929 Other chronic pain: Secondary | ICD-10-CM

## 2021-09-19 DIAGNOSIS — M1990 Unspecified osteoarthritis, unspecified site: Secondary | ICD-10-CM

## 2021-09-19 DIAGNOSIS — E1149 Type 2 diabetes mellitus with other diabetic neurological complication: Secondary | ICD-10-CM

## 2021-09-19 DIAGNOSIS — G4709 Other insomnia: Secondary | ICD-10-CM

## 2021-09-19 DIAGNOSIS — M5126 Other intervertebral disc displacement, lumbar region: Secondary | ICD-10-CM

## 2021-09-19 MED ORDER — ATORVASTATIN CALCIUM 40 MG PO TABS
40.0000 mg | ORAL_TABLET | Freq: Every day | ORAL | 6 refills | Status: DC
  Filled 2021-09-19: qty 30, 30d supply, fill #0

## 2021-09-19 MED ORDER — GABAPENTIN 300 MG PO CAPS
600.0000 mg | ORAL_CAPSULE | Freq: Three times a day (TID) | ORAL | 6 refills | Status: DC
  Filled 2021-09-19: qty 180, 30d supply, fill #0

## 2021-09-19 MED ORDER — INSULIN LISPRO (1 UNIT DIAL) 100 UNIT/ML (KWIKPEN)
5.0000 [IU] | PEN_INJECTOR | Freq: Two times a day (BID) | SUBCUTANEOUS | 6 refills | Status: DC
Start: 2021-09-19 — End: 2021-10-04
  Filled 2021-09-19: qty 6, 60d supply, fill #0

## 2021-09-19 MED ORDER — EMPAGLIFLOZIN 10 MG PO TABS
10.0000 mg | ORAL_TABLET | Freq: Every day | ORAL | 6 refills | Status: DC
  Filled 2021-09-19: qty 30, 30d supply, fill #0

## 2021-09-19 MED ORDER — GLIPIZIDE ER 10 MG PO TB24
10.0000 mg | ORAL_TABLET | Freq: Every day | ORAL | 6 refills | Status: DC
  Filled 2021-09-19: qty 30, 30d supply, fill #0

## 2021-09-19 MED ORDER — DULOXETINE HCL 60 MG PO CPEP
60.0000 mg | ORAL_CAPSULE | Freq: Every day | ORAL | 6 refills | Status: DC
  Filled 2021-09-19: qty 30, 30d supply, fill #0

## 2021-09-19 MED ORDER — INSULIN GLARGINE SOLOSTAR 100 UNIT/ML ~~LOC~~ SOPN
24.0000 [IU] | PEN_INJECTOR | Freq: Every day | SUBCUTANEOUS | 2 refills | Status: DC
  Filled 2021-09-19: qty 15, 62d supply, fill #0

## 2021-09-19 MED ORDER — HYDROXYZINE HCL 25 MG PO TABS
25.0000 mg | ORAL_TABLET | Freq: Every day | ORAL | 2 refills | Status: DC
  Filled 2021-09-19: qty 30, 30d supply, fill #0

## 2021-09-19 NOTE — Progress Notes (Signed)
Pt seen by Dr Joya Gaskins.  He has appt w/ ENT on 07/17 at 1:30 pm, aware of day/time of appt.  He has Urology appt 07/19 at 8:30 am  July 31st PCP appt at 11:10 am.  He has problems w/ urination, urge incontinence. When he has to go, he HAS to go and may not make it. Urination is frequent, he has had hematuria, but is not having it now.   He was given instructions on getting there by bus.   He saw the orthopedist, was told he has OA R shoulder, refer to Dr Marlou Sa for possible shoulder replacement.   Had CT renal stone study on 06/03, nephrolithiasis noted, R dbl  J ureteral stent is appropriately located. 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   The cream he was given works on the itching, the bug bites are healing, but he needs more.   CBG 266  Today's Vitals   09/19/21 1508  BP: 133/87  Pulse: 90  Resp: 20  SpO2: 94%     Rosaria Ferries, PA-C 09/19/2021 3:33 PM

## 2021-09-20 ENCOUNTER — Other Ambulatory Visit (HOSPITAL_COMMUNITY): Payer: Self-pay

## 2021-09-20 ENCOUNTER — Other Ambulatory Visit: Payer: Self-pay | Admitting: *Deleted

## 2021-09-20 ENCOUNTER — Encounter: Payer: Self-pay | Admitting: *Deleted

## 2021-09-20 ENCOUNTER — Other Ambulatory Visit: Payer: Self-pay

## 2021-09-20 DIAGNOSIS — E1165 Type 2 diabetes mellitus with hyperglycemia: Secondary | ICD-10-CM

## 2021-09-20 MED ORDER — ACCU-CHEK GUIDE VI STRP
ORAL_STRIP | 3 refills | Status: DC
  Filled 2021-09-20: qty 100, 33d supply, fill #0

## 2021-09-20 NOTE — Congregational Nurse Program (Signed)
Meds obtained at pharm Appts listed on index card given to pt and copy left for angel

## 2021-09-24 ENCOUNTER — Ambulatory Visit: Payer: Medicaid Other | Admitting: Family Medicine

## 2021-09-24 ENCOUNTER — Telehealth: Payer: Self-pay | Admitting: Pharmacist

## 2021-09-24 NOTE — Telephone Encounter (Signed)
Patient attempted to be outreached by Berdie Ogren, PharmD Candidate on 09/21/21 to discuss hypertension. Left voicemail for patient to return our call at their convenience.   Catie Hedwig Morton, PharmD, [facility]  [phone]

## 2021-09-26 ENCOUNTER — Encounter: Payer: Self-pay | Admitting: Orthopedic Surgery

## 2021-09-26 ENCOUNTER — Ambulatory Visit (INDEPENDENT_AMBULATORY_CARE_PROVIDER_SITE_OTHER): Payer: Medicaid Other | Admitting: Orthopedic Surgery

## 2021-09-26 DIAGNOSIS — M19011 Primary osteoarthritis, right shoulder: Secondary | ICD-10-CM

## 2021-09-26 NOTE — Progress Notes (Signed)
Office Visit Note   Patient: David Wyatt           Date of Birth: January 05, 1961           MRN: [id number] Visit Date: 09/26/2021 Requested by: [name], MD [place],  [place] [id number] PCP: [name], MD  Subjective: Chief Complaint  Patient presents with   Right Shoulder - Pain    HPI: David Wyatt is a 61 year old patient with shoulder pain.  Has chronic pain.  Reports some weakness which is a bigger problem than his pain.  Had a CT scan done a year ago which did show some superior migration of the humeral head.  Injection helped his right shoulder.  Using a walker for prior back surgery.  Left shoulder is asymptomatic.  Has occasional numbness and tingling in the right hand but denies any neck pain.              ROS: All systems reviewed are negative as they relate to the chief complaint within the history of present illness.  Patient denies  fevers or chills.   Assessment & Plan: Visit Diagnoses:  1. Primary osteoarthritis, right shoulder     Plan: Impression is right shoulder rotator cuff arthropathy but with maintained forward flexion and abduction both above 90 degrees.  Plan is episodic injections about 1 every 4 months.  Come back in 3 months for another glenohumeral/subacromial injection.  We will think he is a great candidate for shoulder replacement yet because it is maintained overhead function.  Follow-Up Instructions: No follow-ups on file.   Orders:  No orders of the defined types were placed in this encounter.  No orders of the defined types were placed in this encounter.     Procedures: No procedures performed   Clinical Data: No additional findings.  Objective: Vital Signs: There were no vitals taken for this visit.  Physical Exam:   Constitutional: Patient appears well-developed HEENT:  Head: Normocephalic Eyes:EOM are normal Neck: Normal range of motion Cardiovascular: Normal rate Pulmonary/chest: Effort  normal Neurologic: Patient is alert Skin: Skin is warm Psychiatric: Patient has normal mood and affect   Ortho Exam: Ortho exam demonstrates active forward flexion and abduction both above 90 degrees on the right-hand side.  He does have external rotation weakness on the right 4 out of 5 compared to the left at 5 out of 5.  Passive range of motion bilaterally is 80/100/165.  Neck range of motion full.  Specialty Comments:  No specialty comments available.  Imaging: No results found.   PMFS History: Patient Active Problem List   Diagnosis Date Noted   History of cocaine use 07/23/2021   Chronic right shoulder pain 07/23/2021   Renal stones 07/23/2021   Dermoid cyst of skin of back 07/23/2021   Bilateral impacted cerumen 07/23/2021   Uncontrolled type 2 diabetes mellitus with hyperglycemia ([facility]) 03/22/2021   Left kidney mass 03/22/2021   Hydronephrosis of right kidney 02/23/2021   Tobacco abuse 08/19/2018   Hyperlipidemia 04/24/2017   GERD (gastroesophageal reflux disease) 06/13/2015   Diabetic neuropathy ([facility]) 05/01/2015   Essential hypertension, benign 11/01/2013   Lumbar disc herniation 09/13/2013   Unspecified constipation 09/13/2013   Chronic back pain 09/13/2013   Felon of finger of left hand with lymphangitis 12/13/2012   Crack cocaine use 07/29/2012   Arthritis 07/29/2012   Weakness generalized 07/29/2012   Past Medical History:  Diagnosis Date   Angioedema of lips 07/28/2012   left upper (  07/29/2012)   Arthritis    hands and back   Chronic back pain    Cocaine abuse ([facility])    Diabetic neuropathy ([facility])    High cholesterol    Hypertension    Neuropathy    Type II diabetes mellitus ([facility])     Family History  Problem Relation Age of Onset   Diabetes Mother    Kidney disease Mother    Hyperlipidemia Mother    Hyperlipidemia Father    Diabetes Father     Past Surgical History:  Procedure Laterality Date   BACK SURGERY     CYSTOSCOPY W/ URETERAL STENT  PLACEMENT Right 02/23/2021   Procedure: CYSTOSCOPY WITH RETROGRADE PYELOGRAM/URETERAL STENT PLACEMENT;  Surgeon: Janith Lima, MD;  Location: [facility];  Service: Urology;  Laterality: Right;   HERNIA REPAIR Right 04/01/2012   I & D EXTREMITY Left 12/13/2012   Procedure: IRRIGATION AND DEBRIDEMENT LEFT THUMB;  Surgeon: Tennis Must, MD;  Location: [facility];  Service: Orthopedics;  Laterality: Left;   INCISION AND DRAINAGE OF WOUND     boil on back/notes 07/14/2008 (07/29/2012)   INGUINAL HERNIA REPAIR  04/01/2012   Procedure: HERNIA REPAIR INGUINAL ADULT;  Surgeon: Haywood Lasso, MD;  Location: [facility];  Service: General;  Laterality: Right;   INGUINAL HERNIA REPAIR Left 09/06/2016   Procedure: OPEN REPAIR LEFT INGUINAL HERNIA;  Surgeon: Greer Pickerel, MD;  Location: [facility];  Service: General;  Laterality: Left;   INSERTION OF MESH Left 09/06/2016   Procedure: INSERTION OF MESH;  Surgeon: Greer Pickerel, MD;  Location: [facility];  Service: General;  Laterality: Left;   TONSILLECTOMY     Social History   Occupational History    Comment: disabled  Tobacco Use   Smoking status: Every Day    Packs/day: 0.25    Years: 30.00    Total pack years: 7.50    Types: Cigarettes   Smokeless tobacco: Former    Quit date: 08/15/2015   Tobacco comments:    04/29/16 2  cigs daily, 10/27/17 sometimes < .25 PPD  Vaping Use   Vaping Use: Never used  Substance and Sexual Activity   Alcohol use: No    Alcohol/week: 0.0 standard drinks of alcohol   Drug use: Not Currently    Types: "Crack" cocaine   Sexual activity: Not on file

## 2021-10-03 ENCOUNTER — Encounter: Payer: Self-pay | Admitting: Physician Assistant

## 2021-10-03 NOTE — Progress Notes (Signed)
Pt seen by Dr Joya Gaskins.  Pt saw Dr Marlou Sa w/ ortho, get shoulder injections q 4 months, come back in 3 months.  No hematuria, has appt w/ Urology 07/27. Will cancel this, pt advised that occasional hematuria when he has a stent is not unusual.   Earlier today, pt c/o feeling bad. SBP 100s, HR 150, O2 sats 98%, CBG 172  Pt encouraged to do hydration, VS greatly improved.  CBG now 162. Today's Vitals   10/03/21 1456  BP: 121/80  Pulse: 86  Resp: 20  SpO2: 95%   Pt says still being bothered by bedbugs, needs housing. Was given more cream for the bedbugs.    He does not yet have a voucher for housing.   He has a [employer]ianne Lei, has 3 tickets so it is not legal. He wants to take care of these before he goes to court.   His walker is broken, he says something got bad and he tried to bend it off and it broke.   Ears are completely clear, hearing is better.  Rosaria Ferries, PA-C 10/03/2021 3:11 PM

## 2021-10-04 ENCOUNTER — Other Ambulatory Visit: Payer: Self-pay

## 2021-10-04 ENCOUNTER — Emergency Department (HOSPITAL_COMMUNITY)
Admission: EM | Admit: 2021-10-04 | Discharge: 2021-10-04 | Discharge status: Against Medical Advice | Payer: Medicaid Other | Attending: Emergency Medicine | Admitting: Emergency Medicine

## 2021-10-04 ENCOUNTER — Telehealth: Payer: Self-pay | Admitting: Emergency Medicine

## 2021-10-04 ENCOUNTER — Other Ambulatory Visit: Payer: Self-pay | Admitting: *Deleted

## 2021-10-04 ENCOUNTER — Encounter (HOSPITAL_COMMUNITY): Payer: Self-pay

## 2021-10-04 ENCOUNTER — Ambulatory Visit: Payer: Self-pay | Admitting: *Deleted

## 2021-10-04 DIAGNOSIS — M5126 Other intervertebral disc displacement, lumbar region: Secondary | ICD-10-CM

## 2021-10-04 DIAGNOSIS — Z5900 Homelessness unspecified: Secondary | ICD-10-CM | POA: Diagnosis not present

## 2021-10-04 DIAGNOSIS — X30XXXA Exposure to excessive natural heat, initial encounter: Secondary | ICD-10-CM | POA: Insufficient documentation

## 2021-10-04 DIAGNOSIS — M1990 Unspecified osteoarthritis, unspecified site: Secondary | ICD-10-CM

## 2021-10-04 DIAGNOSIS — Z5321 Procedure and treatment not carried out due to patient leaving prior to being seen by health care provider: Secondary | ICD-10-CM | POA: Insufficient documentation

## 2021-10-04 DIAGNOSIS — E1165 Type 2 diabetes mellitus with hyperglycemia: Secondary | ICD-10-CM

## 2021-10-04 DIAGNOSIS — I10 Essential (primary) hypertension: Secondary | ICD-10-CM

## 2021-10-04 DIAGNOSIS — M7501 Adhesive capsulitis of right shoulder: Secondary | ICD-10-CM

## 2021-10-04 DIAGNOSIS — E860 Dehydration: Secondary | ICD-10-CM | POA: Diagnosis present

## 2021-10-04 DIAGNOSIS — E1149 Type 2 diabetes mellitus with other diabetic neurological complication: Secondary | ICD-10-CM

## 2021-10-04 DIAGNOSIS — G4709 Other insomnia: Secondary | ICD-10-CM

## 2021-10-04 DIAGNOSIS — G8929 Other chronic pain: Secondary | ICD-10-CM

## 2021-10-04 LAB — CBC
HCT: 41.5 % (ref 39.0–52.0)
Hemoglobin: 13.4 g/dL (ref 13.0–17.0)
MCH: 31.5 pg (ref 26.0–34.0)
MCHC: 32.3 g/dL (ref 30.0–36.0)
MCV: 97.4 fL (ref 80.0–100.0)
Platelets: 367 10*3/uL (ref 150–400)
RBC: 4.26 MIL/uL (ref 4.22–5.81)
RDW: 13.6 % (ref 11.5–15.5)
WBC: 9.3 10*3/uL (ref 4.0–10.5)
nRBC: 0 % (ref 0.0–0.2)

## 2021-10-04 LAB — BASIC METABOLIC PANEL
Anion gap: 9 (ref 5–15)
BUN: 13 mg/dL (ref 8–23)
CO2: 22 mmol/L (ref 22–32)
Calcium: 8.9 mg/dL (ref 8.9–10.3)
Chloride: 107 mmol/L (ref 98–111)
Creatinine, Ser: 1.15 mg/dL (ref 0.61–1.24)
GFR, Estimated: >60 mL/min (ref >60)
Glucose, Bld: 236 mg/dL — ABNORMAL HIGH (ref 70–99)
Potassium: 4.4 mmol/L (ref 3.5–5.1)
Sodium: 138 mmol/L (ref 135–145)

## 2021-10-04 MED ORDER — INSULIN PEN NEEDLE 31G X 8 MM MISC
1 refills | Status: DC
  Filled 2021-10-04: qty 100, 25d supply, fill #0

## 2021-10-04 MED ORDER — DULOXETINE HCL 60 MG PO CPEP
60.0000 mg | ORAL_CAPSULE | Freq: Every day | ORAL | 6 refills | Status: DC
  Filled 2021-10-04: qty 30, 30d supply, fill #0

## 2021-10-04 MED ORDER — GABAPENTIN 300 MG PO CAPS
600.0000 mg | ORAL_CAPSULE | Freq: Three times a day (TID) | ORAL | 6 refills | Status: DC
  Filled 2021-10-04: qty 180, 30d supply, fill #0

## 2021-10-04 MED ORDER — EMPAGLIFLOZIN 10 MG PO TABS
10.0000 mg | ORAL_TABLET | Freq: Every day | ORAL | 6 refills | Status: DC
  Filled 2021-10-04: qty 30, 30d supply, fill #0

## 2021-10-04 MED ORDER — GLIPIZIDE ER 10 MG PO TB24
10.0000 mg | ORAL_TABLET | Freq: Every day | ORAL | 6 refills | Status: DC
  Filled 2021-10-04: qty 30, 30d supply, fill #0

## 2021-10-04 MED ORDER — INSULIN LISPRO (1 UNIT DIAL) 100 UNIT/ML (KWIKPEN)
5.0000 [IU] | PEN_INJECTOR | Freq: Two times a day (BID) | SUBCUTANEOUS | 6 refills | Status: DC
Start: 2021-10-04 — End: 2021-11-29
  Filled 2021-10-04: qty 6, 60d supply, fill #0

## 2021-10-04 MED ORDER — ATORVASTATIN CALCIUM 40 MG PO TABS
40.0000 mg | ORAL_TABLET | Freq: Every day | ORAL | 6 refills | Status: DC
  Filled 2021-10-04: qty 30, 30d supply, fill #0

## 2021-10-04 MED ORDER — METFORMIN HCL 1000 MG PO TABS
1000.0000 mg | ORAL_TABLET | Freq: Two times a day (BID) | ORAL | 6 refills | Status: DC
  Filled 2021-10-04: qty 60, 30d supply, fill #0

## 2021-10-04 MED ORDER — AMLODIPINE BESYLATE 10 MG PO TABS
10.0000 mg | ORAL_TABLET | Freq: Every day | ORAL | 6 refills | Status: DC
  Filled 2021-10-04: qty 30, 30d supply, fill #0

## 2021-10-04 MED ORDER — HYDROXYZINE HCL 25 MG PO TABS
25.0000 mg | ORAL_TABLET | Freq: Every day | ORAL | 2 refills | Status: DC
  Filled 2021-10-04: qty 30, 30d supply, fill #0

## 2021-10-04 MED ORDER — INSULIN GLARGINE SOLOSTAR 100 UNIT/ML ~~LOC~~ SOPN
24.0000 [IU] | PEN_INJECTOR | Freq: Every day | SUBCUTANEOUS | 2 refills | Status: DC
  Filled 2021-10-04: qty 15, 62d supply, fill #0

## 2021-10-04 NOTE — Telephone Encounter (Signed)
Message copied from previous encounter. Requesting call from SW. Pls see below:  Patient got hot yesterday and started feeling bad. Patient states he is homeless- but was rushed into building to cool- he did have elevated BP- does not remember exact numbers -but estimate is 154/45. Patient does not have way to check BP today. Reviewed symptoms of high BP -cardiac/neurological with patient, advised hydration and avoid direct sunlight. Patient states he will see if he can get it checked today and will call office back. Patient also request call from Education officer, museum.

## 2021-10-04 NOTE — Telephone Encounter (Signed)
Copied from [place] [phone]. Topic: General - Other >> Oct 04, 2021  8:11 AM Chapman Fitch wrote: Reason for CRM: pt is homeless and needs assistance and asked to speak with a case worker./ please advise

## 2021-10-04 NOTE — ED Provider Triage Note (Signed)
Emergency Medicine Provider Triage Evaluation Note  David Wyatt , a 61 y.o. male  was evaluated in triage.  Pt complains of heat exposure. States that he is homeless and has been outside in the heat all day yesterday and today and feels dehydration. Positive orthostatics with EMS. Denies any chest pain, shortness of breath, nausea, or vomiting  Review of Systems  Positive:  Negative:   Physical Exam  BP 126/80   Pulse (!) 102   Temp 99.3 F (37.4 C) (Oral)   Resp 16   Ht '5\' 8"'$  (1.727 m)   Wt 72.6 kg   SpO2 98%   BMI 24.33 kg/m  Gen:   Awake, no distress   Resp:  Normal effort  MSK:   Moves extremities without difficulty  Other:    Medical Decision Making  Medically screening exam initiated at 1:28 PM.  Appropriate orders placed.  [name] was informed that the remainder of the evaluation will be completed by another provider, this initial triage assessment does not replace that evaluation, and the importance of remaining in the ED until their evaluation is complete.     Bud Face, PA-C 10/04/21 1329

## 2021-10-04 NOTE — Telephone Encounter (Signed)
Patient reports he was out in the sun and patient got hot and did not feel well. Patient states he was rushed inside with elevated BP reading. BP 154/45 Reason for Disposition  Systolic BP  >= 387 OR Diastolic >= 564  Answer Assessment - Initial Assessment Questions 1. BLOOD PRESSURE: "What is the blood pressure?" "Did you take at least two measurements 5 minutes apart?"     Not sure what BP is today 2. ONSET: "When did you take your blood pressure?"     yesterday 3. HOW: "How did you take your blood pressure?" (e.g., automatic home BP monitor, visiting nurse)     *No Answer* 4. HISTORY: "Do you have a history of high blood pressure?"     yes 5. MEDICINES: "Are you taking any medicines for blood pressure?" "Have you missed any doses recently?"     Yes- no missed medication 6. OTHER SYMPTOMS: "Do you have any symptoms?" (e.g., blurred vision, chest pain, difficulty breathing, headache, weakness)     Patient feels fine today 7. PREGNANCY: "Is there any chance you are pregnant?" "When was your last menstrual period?"     *No Answer*  Protocols used: Blood Pressure - High-A-AH

## 2021-10-04 NOTE — ED Triage Notes (Signed)
Per EMS- Patient was picked up from the [employer]. BP-130/70 sitting and BP-110/60  standing,  Patient was given 500 ml NS by EMS prior to EMS arrival.

## 2021-10-04 NOTE — Telephone Encounter (Signed)
  Chief Complaint: elevated BP yesterday Symptoms: patient got hot yesterday and started feeling bad. Patient states he is homeless- but was rushed into building to cool- he did have elevated BP- does not remember exact numbers -but estimate is 154/45. Patient does not have way to check BP today. Reviewed symptoms of high BP -cardiac/neurological with patient, advised hydration and avoid direct sunlight. Patient states he will see if he can get it checked today and will call office back. Patient also request call from Education officer, museum. Frequency: yesterday Pertinent Negatives: Patient denies symptoms now Disposition: '[]'$ ED /'[]'$ Urgent Care (no appt availability in office) / '[]'$ Appointment(In office/virtual)/ '[]'$  [facility]/ '[]'$ Home Care/ '[]'$ Refused Recommended Disposition /'[]'$ [facility]/ '[x]'$  Follow-up with PCP Additional Notes: Patient has appointment next week with provider- advised call back if he gets BP chacked and has elevated reading or has any of the signs discussed

## 2021-10-04 NOTE — ED Notes (Signed)
Patient told registration that he wanted to leave and needed his IV removed. Writer began removing the tape and was in the motion of pulling the IV out and patient states I need a note.  Writer told patient that he could not get a note until he was seen and discharged. Patient immediately turned around and walked really fast out of the ED Lobby. Patient asked to  come back to have the IV removed and patient kept walking out of the ED.

## 2021-10-07 ENCOUNTER — Emergency Department (HOSPITAL_COMMUNITY): Payer: Medicaid Other

## 2021-10-07 ENCOUNTER — Encounter (HOSPITAL_COMMUNITY): Payer: Self-pay

## 2021-10-07 ENCOUNTER — Emergency Department (HOSPITAL_COMMUNITY)
Admission: EM | Admit: 2021-10-07 | Discharge: 2021-10-07 | Disposition: A | Discharge status: Home / Self Care | Payer: Medicaid Other | Attending: Emergency Medicine | Admitting: Emergency Medicine

## 2021-10-07 ENCOUNTER — Other Ambulatory Visit: Payer: Self-pay

## 2021-10-07 DIAGNOSIS — M25511 Pain in right shoulder: Secondary | ICD-10-CM | POA: Diagnosis present

## 2021-10-07 DIAGNOSIS — Z794 Long term (current) use of insulin: Secondary | ICD-10-CM | POA: Insufficient documentation

## 2021-10-07 DIAGNOSIS — G8929 Other chronic pain: Secondary | ICD-10-CM | POA: Insufficient documentation

## 2021-10-07 DIAGNOSIS — R339 Retention of urine, unspecified: Secondary | ICD-10-CM | POA: Insufficient documentation

## 2021-10-07 DIAGNOSIS — R519 Headache, unspecified: Secondary | ICD-10-CM | POA: Insufficient documentation

## 2021-10-07 LAB — CBC WITH DIFFERENTIAL/PLATELET
Abs Immature Granulocytes: 0.04 10*3/uL (ref 0.00–0.07)
Basophils Absolute: 0 10*3/uL (ref 0.0–0.1)
Basophils Relative: 0 %
Eosinophils Absolute: 0.5 10*3/uL (ref 0.0–0.5)
Eosinophils Relative: 5 %
HCT: 41.9 % (ref 39.0–52.0)
Hemoglobin: 13.7 g/dL (ref 13.0–17.0)
Immature Granulocytes: 0 %
Lymphocytes Relative: 17 %
Lymphs Abs: 1.5 10*3/uL (ref 0.7–4.0)
MCH: 31.5 pg (ref 26.0–34.0)
MCHC: 32.7 g/dL (ref 30.0–36.0)
MCV: 96.3 fL (ref 80.0–100.0)
Monocytes Absolute: 1.7 10*3/uL — ABNORMAL HIGH (ref 0.1–1.0)
Monocytes Relative: 19 %
Neutro Abs: 5.2 10*3/uL (ref 1.7–7.7)
Neutrophils Relative %: 59 %
Platelets: 376 10*3/uL (ref 150–400)
RBC: 4.35 MIL/uL (ref 4.22–5.81)
RDW: 13.4 % (ref 11.5–15.5)
WBC: 8.9 10*3/uL (ref 4.0–10.5)
nRBC: 0 % (ref 0.0–0.2)

## 2021-10-07 LAB — URINALYSIS, ROUTINE W REFLEX MICROSCOPIC
Bacteria, UA: NONE SEEN
Bilirubin Urine: NEGATIVE
Glucose, UA: >=500 mg/dL — AB
Ketones, ur: NEGATIVE mg/dL
Nitrite: NEGATIVE
Protein, ur: 30 mg/dL — AB
Specific Gravity, Urine: 1.01 (ref 1.005–1.030)
WBC, UA: >50 WBC/hpf — ABNORMAL HIGH (ref 0–5)
pH: 5 (ref 5.0–8.0)

## 2021-10-07 LAB — COMPREHENSIVE METABOLIC PANEL
ALT: 10 U/L (ref 0–44)
AST: 12 U/L — ABNORMAL LOW (ref 15–41)
Albumin: 3.5 g/dL (ref 3.5–5.0)
Alkaline Phosphatase: 73 U/L (ref 38–126)
Anion gap: 10 (ref 5–15)
BUN: 14 mg/dL (ref 8–23)
CO2: 24 mmol/L (ref 22–32)
Calcium: 9.4 mg/dL (ref 8.9–10.3)
Chloride: 107 mmol/L (ref 98–111)
Creatinine, Ser: 1.19 mg/dL (ref 0.61–1.24)
GFR, Estimated: >60 mL/min (ref >60)
Glucose, Bld: 81 mg/dL (ref 70–99)
Potassium: 4 mmol/L (ref 3.5–5.1)
Sodium: 141 mmol/L (ref 135–145)
Total Bilirubin: 0.7 mg/dL (ref 0.3–1.2)
Total Protein: 7.9 g/dL (ref 6.5–8.1)

## 2021-10-07 MED ORDER — CEPHALEXIN 500 MG PO CAPS
500.0000 mg | ORAL_CAPSULE | Freq: Once | ORAL | Status: AC
  Administered 2021-10-07: 500 mg via ORAL
  Filled 2021-10-07: qty 1

## 2021-10-07 MED ORDER — IBUPROFEN 200 MG PO TABS
600.0000 mg | ORAL_TABLET | Freq: Once | ORAL | Status: AC
  Administered 2021-10-07: 600 mg via ORAL
  Filled 2021-10-07: qty 3

## 2021-10-07 MED ORDER — CYCLOBENZAPRINE HCL 10 MG PO TABS
10.0000 mg | ORAL_TABLET | Freq: Two times a day (BID) | ORAL | 0 refills | Status: DC | PRN
  Filled 2021-10-07: qty 14, 7d supply, fill #0

## 2021-10-07 MED ORDER — ACETAMINOPHEN 325 MG PO TABS
650.0000 mg | ORAL_TABLET | Freq: Once | ORAL | Status: AC
  Administered 2021-10-07: 650 mg via ORAL
  Filled 2021-10-07: qty 2

## 2021-10-07 MED ORDER — CYCLOBENZAPRINE HCL 10 MG PO TABS
10.0000 mg | ORAL_TABLET | Freq: Once | ORAL | Status: AC
  Administered 2021-10-07: 10 mg via ORAL
  Filled 2021-10-07: qty 1

## 2021-10-07 MED ORDER — CEPHALEXIN 500 MG PO CAPS
500.0000 mg | ORAL_CAPSULE | Freq: Three times a day (TID) | ORAL | 0 refills | Status: DC
  Filled 2021-10-07 – 2021-10-08 (×3): qty 21, 7d supply, fill #0

## 2021-10-07 MED ORDER — IBUPROFEN 600 MG PO TABS
600.0000 mg | ORAL_TABLET | Freq: Three times a day (TID) | ORAL | 0 refills | Status: DC | PRN
  Filled 2021-10-07: qty 21, 7d supply, fill #0

## 2021-10-07 NOTE — ED Provider Notes (Signed)
[facility] DEPT Provider Note   CSN: [id number] Arrival date & time: 10/07/21  7026     History  Chief Complaint  Patient presents with   Shoulder Pain   Urinary Retention    David Wyatt is a 61 y.o. male present emerged department multiple complaints.  The patient reports that he has right shoulder pain which is ongoing for many months, that he wants an orthopedist who provided steroid injections which improved his pain.  He also reports he has diffuse headache which is on and off at different times of the day, is having urinary frequency and hesitation.  HPI     Home Medications Prior to Admission medications   Medication Sig Start Date End Date Taking? Authorizing Provider  cephALEXin (KEFLEX) 500 MG capsule Take 1 capsule (500 mg total) by mouth 3 (three) times daily for 7 days. 10/07/21 10/14/21 Yes Deborrah Mabin, Carola Rhine, MD  cyclobenzaprine (FLEXERIL) 10 MG tablet Take 1 tablet (10 mg total) by mouth 2 (two) times daily as needed for up to 14 doses for muscle spasms. 10/07/21  Yes Makylie Rivere, Carola Rhine, MD  ibuprofen (ADVIL) 600 MG tablet Take 1 tablet (600 mg total) by mouth every 8 (eight) hours as needed for up to 21 doses for mild pain or moderate pain. 10/07/21  Yes Wyvonnia Dusky, MD  Accu-Chek Softclix Lancets lancets Test blood sugar 2 times daily 03/22/21   Mayers, Cari S, PA-C  Accu-Chek Softclix Lancets lancets Use up to 4 times daily as directed 07/04/21   Barrett, Evelene Croon, PA-C  amLODipine (NORVASC) 10 MG tablet Take 1 tablet (10 mg total) by mouth daily. 10/04/21   Elsie Stain, MD  atorvastatin (LIPITOR) 40 MG tablet Take 1 tablet (40 mg total) by mouth daily. 10/04/21   Elsie Stain, MD  Blood Glucose Monitoring Suppl (ACCU-CHEK GUIDE) w/Device KIT USE AS DIRECTED TO CHECK BLOOD SUGARS UP TO TWICE PER DAY 07/05/21   [name], MD  carbamide peroxide (DEBROX) 6.5 % OTIC solution Place 5 drops into the right ear 2 (two) times  daily. Patient not taking: Reported on 09/05/2021 07/04/21 08/03/22  Barrett, Evelene Croon, PA-C  cyclobenzaprine (FLEXERIL) 10 MG tablet Take 1 tablet (10 mg total) by mouth 2 (two) times daily as needed for muscle spasms. 12/26/20   [name], MD  DULoxetine (CYMBALTA) 60 MG capsule Take 1 capsule (60 mg total) by mouth daily. 10/04/21   Elsie Stain, MD  empagliflozin (JARDIANCE) 10 MG TABS tablet Take 1 tablet (10 mg total) by mouth daily before breakfast. 10/04/21   Elsie Stain, MD  gabapentin (NEURONTIN) 300 MG capsule Take 2 capsules (600 mg total) by mouth 3 (three) times daily. 10/04/21   Elsie Stain, MD  glipiZIDE (GLUCOTROL XL) 10 MG 24 hr tablet Take 1 tablet (10 mg total) by mouth daily with breakfast. 10/04/21   Elsie Stain, MD  glucose blood (ACCU-CHEK GUIDE) test strip Use up to 3 times daily as directed 09/20/21   Elsie Stain, MD  hydrOXYzine (ATARAX) 25 MG tablet Take 1 tablet (25 mg total) by mouth at bedtime. 10/04/21   Elsie Stain, MD  Insulin Glargine Solostar (LANTUS) 100 UNIT/ML Solostar Pen Inject 24 Units into the skin daily. 10/04/21   Elsie Stain, MD  insulin lispro (HUMALOG) 100 UNIT/ML KwikPen Inject 5 Units into the skin 2 (two) times daily before lunch and supper. 10/04/21   Elsie Stain, MD  Insulin Pen  Needle 31G X 8 MM MISC Use with insulin pen 10/04/21   Elsie Stain, MD  meloxicam (MOBIC) 15 MG tablet Take 1 tablet (15 mg total) by mouth daily. 09/05/21   Mayers, Cari S, PA-C  metFORMIN (GLUCOPHAGE) 1000 MG tablet Take 1 tablet (1,000 mg total) by mouth 2 (two) times daily. 10/04/21   Elsie Stain, MD  methocarbamol (ROBAXIN) 500 MG tablet Take 1 tablet (500 mg total) by mouth 3 times/day as needed-between meals & bedtime for muscle spasms. 07/20/21   Harris, Abigail, PA-C  NEOMYCIN-POLYMYXIN-HYDROCORTISONE (CORTISPORIN) 1 % SOLN OTIC solution Place 3 drops into both ears 4 (four) times daily. 08/15/21   Elsie Stain,  MD  loratadine (CLARITIN) 10 MG tablet Take 1 tablet (10 mg total) by mouth daily. Patient not taking: No sig reported 06/03/17 07/20/20  [name], MD      Allergies    Lisinopril    Review of Systems   Review of Systems  Physical Exam Updated Vital Signs BP 115/79 (BP Location: Left Arm)   Pulse 88   Temp 99.8 F (37.7 C) (Oral)   Resp 16   Ht _0  (1.727 m)   Wt 72.6 kg   SpO2 98%   BMI 24.33 kg/m  Physical Exam Constitutional:      General: He is not in acute distress. HENT:     Head: Normocephalic and atraumatic.  Eyes:     Conjunctiva/sclera: Conjunctivae normal.     Pupils: Pupils are equal, round, and reactive to light.  Cardiovascular:     Rate and Rhythm: Normal rate and regular rhythm.  Pulmonary:     Effort: Pulmonary effort is normal. No respiratory distress.  Abdominal:     General: There is no distension.     Tenderness: There is no abdominal tenderness.  Musculoskeletal:     Comments: Full Rom of bilateral shoulders/arms  Skin:    General: Skin is warm and dry.  Neurological:     General: No focal deficit present.     Mental Status: He is alert. Mental status is at baseline.  Psychiatric:        Mood and Affect: Mood normal.        Behavior: Behavior normal.     ED Results / Procedures / Treatments   Labs (all labs ordered are listed, but only abnormal results are displayed) Labs Reviewed  URINALYSIS, ROUTINE W REFLEX MICROSCOPIC - Abnormal; Notable for the following components:      Result Value   APPearance HAZY (*)    Glucose, UA >=500 (*)    Hgb urine dipstick MODERATE (*)    Protein, ur 30 (*)    Leukocytes,Ua LARGE (*)    WBC, UA >50 (*)    All other components within normal limits  CBC WITH DIFFERENTIAL/PLATELET - Abnormal; Notable for the following components:   Monocytes Absolute 1.7 (*)    All other components within normal limits  COMPREHENSIVE METABOLIC PANEL - Abnormal; Notable for the following components:   AST  12 (*)    All other components within normal limits  URINE CULTURE    EKG None  Radiology DG Shoulder Right  Result Date: 10/07/2021 CLINICAL DATA:  Acute RIGHT shoulder pain for 2 months. No known injury. EXAM: RIGHT SHOULDER - 3 VIEW COMPARISON:  07/20/2021 radiograph, 12/30/2020 CT and prior studies FINDINGS: There is no evidence of acute fracture, subluxation or dislocation. Mild degenerative changes at the [facility] joint again noted. No suspicious focal  bony lesions are identified. IMPRESSION: No evidence of acute abnormality. Mild AC joint degenerative changes. If there is clinical suspicion for rotator cuff abnormality, recommend elective MRI. Electronically Signed   By: Margarette Canada M.D.   On: 10/07/2021 11:09   CT Head Wo Contrast  Result Date: 10/07/2021 CLINICAL DATA:  61 year old male presenting for evaluation of headache, new or worsening symptoms. EXAM: CT HEAD WITHOUT CONTRAST TECHNIQUE: Contiguous axial images were obtained from the base of the skull through the vertex without intravenous contrast. RADIATION DOSE REDUCTION: This exam was performed according to the departmental dose-optimization program which includes automated exposure control, adjustment of the mA and/or kV according to patient size and/or use of iterative reconstruction technique. COMPARISON:  12/30/2020. FINDINGS: Brain: No evidence of acute infarction, hemorrhage, hydrocephalus, extra-axial collection or mass lesion/mass effect. Signs of atrophy and patchy areas of hypoattenuation in deep white matter with similar distribution compared to previous imaging. Vascular: No hyperdense vessel or unexpected calcification. Skull: Normal. Negative for fracture or focal lesion. Sinuses/Orbits: Visualized paranasal sinuses and orbits without acute process. Other: None IMPRESSION: 1. No acute intracranial abnormality. 2. Signs of atrophy and patchy areas of hypoattenuation in deep white matter with similar distribution compared to  previous imaging, most commonly due to chronic microvascular ischemic changes. Electronically Signed   By: Zetta Bills M.D.   On: 10/07/2021 10:41    Procedures Procedures    Medications Ordered in ED Medications  ibuprofen (ADVIL) tablet 600 mg (has no administration in time range)  acetaminophen (TYLENOL) tablet 650 mg (has no administration in time range)  cyclobenzaprine (FLEXERIL) tablet 10 mg (has no administration in time range)  cephALEXin (KEFLEX) capsule 500 mg (has no administration in time range)    ED Course/ Medical Decision Making/ A&P                           Medical Decision Making Amount and/or Complexity of Data Reviewed Labs: ordered.  Risk OTC drugs. Prescription drug management.   Patient is here with multiple complaints.  He reports urinary hesitation and frequency, multiple joint pain and shoulder pain  Doubt acute fx - shoulder xray reviewed with no acute fx  Headache sounds tension/type - CTH ordered in triage, personally reviewed, no acute CVA or intracranial mass   Labs reviewed - CMP, CBC unremarkable.  UA with large leuks, +hgb - ddx includes UTI vs ureteral stone.  Doubt sepsis  Will start on keflex x 7 days for male UTI Added on urine cx  Recommended he f/u with his prior orthopedic office about his shoulder - may be rotator cuff injury vs frozen shoulder        Final Clinical Impression(s) / ED Diagnoses Final diagnoses:  Urinary retention  Chronic right shoulder pain    Rx / DC Orders ED Discharge Orders          Ordered    cephALEXin (KEFLEX) 500 MG capsule  3 times daily        10/07/21 1303    ibuprofen (ADVIL) 600 MG tablet  Every 8 hours PRN        10/07/21 1303    cyclobenzaprine (FLEXERIL) 10 MG tablet  2 times daily PRN        10/07/21 1303              Wyvonnia Dusky, MD 10/07/21 1304

## 2021-10-07 NOTE — ED Triage Notes (Addendum)
Per EMS- patient states he has trouble urinating. Patient denies hematuria, dysuria, or abdominal pain. Patient reports a history of kidney stones. Patient also c/o right shoulder pain.   Patient added in triage that he has urinary frequency.

## 2021-10-07 NOTE — ED Provider Triage Note (Signed)
Emergency Medicine Provider Triage Evaluation Note  David Wyatt , a 61 y.o. male  was evaluated in triage.  Pt complains of multiple things. He reports a constant  diffuse headache for the past week. Denies any nausea, or vomiting. Additionally, he reports that he has been having urinary frequency. Was seen here almost two months ago for hematuria. Denies any dysuria, abdominal pain, or fever. Reports of his chronic right shoulder pain as well.  Review of Systems  Positive:  Negative:   Physical Exam  BP 115/79 (BP Location: Left Arm)   Pulse 88   Temp 99.8 F (37.7 C) (Oral)   Resp 16   Ht '5\' 8"'$  (1.727 m)   Wt 72.6 kg   SpO2 98%   BMI 24.33 kg/m  Gen:   Awake, no distress   Resp:  Normal effort  MSK:   Moves extremities without difficulty  Other:    Medical Decision Making  Medically screening exam initiated at 10:15 AM.  Appropriate orders placed.  David Wyatt was informed that the remainder of the evaluation will be completed by another provider, this initial triage assessment does not replace that evaluation, and the importance of remaining in the ED until their evaluation is complete.  CT head and labs ordered.    Sherrell Puller, PA-C 10/07/21 1017

## 2021-10-08 ENCOUNTER — Telehealth: Payer: Self-pay | Admitting: Family Medicine

## 2021-10-08 ENCOUNTER — Ambulatory Visit: Payer: Medicaid Other | Attending: Family Medicine | Admitting: Family Medicine

## 2021-10-08 ENCOUNTER — Encounter: Payer: Self-pay | Admitting: Family Medicine

## 2021-10-08 ENCOUNTER — Other Ambulatory Visit: Payer: Self-pay

## 2021-10-08 VITALS — BP 130/80 | HR 103 | Temp 98.2°F | Ht 68.0 in | Wt 174.0 lb

## 2021-10-08 DIAGNOSIS — N200 Calculus of kidney: Secondary | ICD-10-CM | POA: Insufficient documentation

## 2021-10-08 DIAGNOSIS — M19011 Primary osteoarthritis, right shoulder: Secondary | ICD-10-CM | POA: Insufficient documentation

## 2021-10-08 DIAGNOSIS — Z1211 Encounter for screening for malignant neoplasm of colon: Secondary | ICD-10-CM

## 2021-10-08 DIAGNOSIS — E1165 Type 2 diabetes mellitus with hyperglycemia: Secondary | ICD-10-CM | POA: Diagnosis not present

## 2021-10-08 DIAGNOSIS — Z91148 Patient's other noncompliance with medication regimen for other reason: Secondary | ICD-10-CM

## 2021-10-08 DIAGNOSIS — Z91141 Patient's other noncompliance with medication regimen due to financial hardship: Secondary | ICD-10-CM | POA: Insufficient documentation

## 2021-10-08 DIAGNOSIS — Z5901 Sheltered homelessness: Secondary | ICD-10-CM | POA: Diagnosis not present

## 2021-10-08 DIAGNOSIS — L729 Follicular cyst of the skin and subcutaneous tissue, unspecified: Secondary | ICD-10-CM | POA: Diagnosis not present

## 2021-10-08 DIAGNOSIS — N390 Urinary tract infection, site not specified: Secondary | ICD-10-CM | POA: Diagnosis not present

## 2021-10-08 LAB — POCT GLYCOSYLATED HEMOGLOBIN (HGB A1C): HbA1c, POC (controlled diabetic range): 9.3 % — AB (ref 0.0–7.0)

## 2021-10-08 LAB — URINE CULTURE: Culture: 40000 — AB

## 2021-10-08 LAB — GLUCOSE, POCT (MANUAL RESULT ENTRY): POC Glucose: 321 mg/dl — AB (ref 70–99)

## 2021-10-08 MED ORDER — TAMSULOSIN HCL 0.4 MG PO CAPS
0.4000 mg | ORAL_CAPSULE | Freq: Every day | ORAL | 3 refills | Status: DC
  Filled 2021-10-08: qty 30, 30d supply, fill #0

## 2021-10-08 NOTE — Progress Notes (Signed)
Pain in right shoulder. 

## 2021-10-08 NOTE — Progress Notes (Signed)
Subjective:  Patient ID: David Wyatt, male    DOB: Jun 22, 1960  Age: 61 y.o. MRN: [id number]  CC: Diabetes   HPI David Wyatt is a 61 y.o. year old male with a history of type 2 diabetes mellitus (A1c 9.3),  Homelessness, diabetic neuropathy, hypertension, chronic low back pain (status post previous back surgery), right shoulder osteoarthritis, renal calculi medication nonadherence. He resides at [facility].  Interval History: A1C is 9.3 down from 15 previously. Fasting sugars are around 120 He drank orange juice and his sugar is 321 in the clinic today.  He had an ED visit yesterday for urinary retention and was diagnosed with UTI for which he was placed on Keflex. He is yet to pick this up due to lack of finances. He does have renal calculi evident from his CT scan from 08/2021 and was scheduled to see urology but he informs me Dr. Joya Gaskins had canceled this when he had said he did not have gross hematuria.  Followed by Dr. Marlou Sa of orthopedics for right shoulder osteoarthritis with his last visit 2 weeks ago when he received a steroid injection.  Symptoms improved for 3 days but then returned afterwards.  Notes reviewed with plans for episodic injection every 4 months. He complains of an upper back swelling which she would like evaluated He would like to see the LCSW as he resides at [facility] and they are having him sit outside in the sun for most the day.  He would like another place of residence. Past Medical History:  Diagnosis Date   Angioedema of lips 07/28/2012   left upper (07/29/2012)   Arthritis    hands and back   Chronic back pain    Cocaine abuse (HCC)    Diabetic neuropathy (HCC)    High cholesterol    Hypertension    Neuropathy    Type II diabetes mellitus ([place])     Past Surgical History:  Procedure Laterality Date   BACK SURGERY     CYSTOSCOPY W/ URETERAL STENT PLACEMENT Right 02/23/2021   Procedure: CYSTOSCOPY WITH RETROGRADE PYELOGRAM/URETERAL STENT  PLACEMENT;  Surgeon: Janith Lima, MD;  Location: [facility];  Service: Urology;  Laterality: Right;   HERNIA REPAIR Right 04/01/2012   I & D EXTREMITY Left 12/13/2012   Procedure: IRRIGATION AND DEBRIDEMENT LEFT THUMB;  Surgeon: Tennis Must, MD;  Location: [place];  Service: Orthopedics;  Laterality: Left;   INCISION AND DRAINAGE OF WOUND     boil on back/notes 07/14/2008 (07/29/2012)   INGUINAL HERNIA REPAIR  04/01/2012   Procedure: HERNIA REPAIR INGUINAL ADULT;  Surgeon: Haywood Lasso, MD;  Location: [place];  Service: General;  Laterality: Right;   INGUINAL HERNIA REPAIR Left 09/06/2016   Procedure: OPEN REPAIR LEFT INGUINAL HERNIA;  Surgeon: Greer Pickerel, MD;  Location: [place];  Service: General;  Laterality: Left;   INSERTION OF MESH Left 09/06/2016   Procedure: INSERTION OF MESH;  Surgeon: Greer Pickerel, MD;  Location: [place];  Service: General;  Laterality: Left;   TONSILLECTOMY      Family History  Problem Relation Age of Onset   Diabetes Mother    Kidney disease Mother    Hyperlipidemia Mother    Hyperlipidemia Father    Diabetes Father     Social History   Socioeconomic History   Marital status: Divorced    Spouse name: Not on file   Number of children: 3   Years of education: 12   Highest education level:  Not on file  Occupational History    Comment: disabled  Tobacco Use   Smoking status: Every Day    Packs/day: 0.25    Years: 30.00    Total pack years: 7.50    Types: Cigarettes   Smokeless tobacco: Former    Quit date: 08/15/2015   Tobacco comments:    04/29/16 2  cigs daily, 10/27/17 sometimes < .25 PPD  Vaping Use   Vaping Use: Never used  Substance and Sexual Activity   Alcohol use: No    Alcohol/week: 0.0 standard drinks of alcohol   Drug use: Not Currently    Types: "Crack" cocaine   Sexual activity: Not on file  Other Topics Concern   Not on file  Social History Narrative   04/29/17   Lives in shelter   Caffeine- a lot of  tea, coffee   Social  Determinants of Health   Financial Resource Strain: Not on file  Food Insecurity: Not on file  Transportation Needs: Not on file  Physical Activity: Not on file  Stress: Not on file  Social Connections: Not on file    Allergies  Allergen Reactions   Lisinopril Other (See Comments)    Lip Swelling, angioedema    Outpatient Medications Prior to Visit  Medication Sig Dispense Refill   Accu-Chek Softclix Lancets lancets Test blood sugar 2 times daily 100 each 6   Accu-Chek Softclix Lancets lancets Use up to 4 times daily as directed 100 each 0   amLODipine (NORVASC) 10 MG tablet Take 1 tablet (10 mg total) by mouth daily. 30 tablet 6   atorvastatin (LIPITOR) 40 MG tablet Take 1 tablet (40 mg total) by mouth daily. 30 tablet 6   Blood Glucose Monitoring Suppl (ACCU-CHEK GUIDE) w/Device KIT USE AS DIRECTED TO CHECK BLOOD SUGARS UP TO TWICE PER DAY 1 kit 0   cephALEXin (KEFLEX) 500 MG capsule Take 1 capsule (500 mg total) by mouth 3 (three) times daily for 7 days. 21 capsule 0   cyclobenzaprine (FLEXERIL) 10 MG tablet Take 1 tablet (10 mg total) by mouth 2 (two) times daily as needed for muscle spasms. 40 tablet 6   cyclobenzaprine (FLEXERIL) 10 MG tablet Take 1 tablet (10 mg total) by mouth 2 (two) times daily as needed for up to 14 doses for muscle spasms. 14 tablet 0   DULoxetine (CYMBALTA) 60 MG capsule Take 1 capsule (60 mg total) by mouth daily. 30 capsule 6   empagliflozin (JARDIANCE) 10 MG TABS tablet Take 1 tablet (10 mg total) by mouth daily before breakfast. 30 tablet 6   gabapentin (NEURONTIN) 300 MG capsule Take 2 capsules (600 mg total) by mouth 3 (three) times daily. 180 capsule 6   glipiZIDE (GLUCOTROL XL) 10 MG 24 hr tablet Take 1 tablet (10 mg total) by mouth daily with breakfast. 30 tablet 6   glucose blood (ACCU-CHEK GUIDE) test strip Use up to 3 times daily as directed 100 each 3   hydrOXYzine (ATARAX) 25 MG tablet Take 1 tablet (25 mg total) by mouth at bedtime. 30  tablet 2   ibuprofen (ADVIL) 600 MG tablet Take 1 tablet (600 mg total) by mouth every 8 (eight) hours as needed for up to 21 doses for mild pain or moderate pain. 21 tablet 0   Insulin Glargine Solostar (LANTUS) 100 UNIT/ML Solostar Pen Inject 24 Units into the skin daily. 15 mL 2   insulin lispro (HUMALOG) 100 UNIT/ML KwikPen Inject 5 Units into the skin 2 (two)  times daily before lunch and supper. 6 mL 6   Insulin Pen Needle 31G X 8 MM MISC Use with insulin pen 100 each 1   meloxicam (MOBIC) 15 MG tablet Take 1 tablet (15 mg total) by mouth daily. 30 tablet 1   metFORMIN (GLUCOPHAGE) 1000 MG tablet Take 1 tablet (1,000 mg total) by mouth 2 (two) times daily. 60 tablet 6   methocarbamol (ROBAXIN) 500 MG tablet Take 1 tablet (500 mg total) by mouth 3 times/day as needed-between meals & bedtime for muscle spasms. 20 tablet 0   NEOMYCIN-POLYMYXIN-HYDROCORTISONE (CORTISPORIN) 1 % SOLN OTIC solution Place 3 drops into both ears 4 (four) times daily. 10 mL 0   carbamide peroxide (DEBROX) 6.5 % OTIC solution Place 5 drops into the right ear 2 (two) times daily. (Patient not taking: Reported on 09/05/2021) 15 mL 2   Facility-Administered Medications Prior to Visit  Medication Dose Route Frequency Provider Last Rate Last Admin   insulin aspart (novoLOG) injection 20 Units  20 Units Subcutaneous Once Mayers, Cari S, PA-C         ROS Review of Systems  Constitutional:  Negative for activity change and appetite change.  HENT:  Negative for sinus pressure and sore throat.   Respiratory:  Negative for chest tightness, shortness of breath and wheezing.   Cardiovascular:  Negative for chest pain and palpitations.  Gastrointestinal:  Negative for abdominal distention, abdominal pain and constipation.  Genitourinary: Negative.   Musculoskeletal:        See HPI  Psychiatric/Behavioral:  Negative for behavioral problems and dysphoric mood.     Objective:  BP 130/80   Pulse (!) 103   Temp 98.2 F (36.8  C) (Oral)   Ht 5' 8"  (1.727 m)   Wt 174 lb (78.9 kg)   SpO2 96%   BMI 26.46 kg/m      10/08/2021    8:55 AM 10/07/2021    1:17 PM 10/07/2021    9:54 AM  BP/Weight  Systolic BP 450 388   Diastolic BP 80 76   Wt. (Lbs) 174  160  BMI 26.46 kg/m2  24.33 kg/m2      Physical Exam Constitutional:      Appearance: He is well-developed.  Cardiovascular:     Rate and Rhythm: Tachycardia present.     Heart sounds: Normal heart sounds. No murmur heard. Pulmonary:     Effort: Pulmonary effort is normal.     Breath sounds: Normal breath sounds. No wheezing or rales.  Chest:     Chest wall: No tenderness.  Abdominal:     General: Bowel sounds are normal. There is no distension.     Palpations: Abdomen is soft. There is no mass.     Tenderness: There is no abdominal tenderness.  Musculoskeletal:        General: Normal range of motion.     Right lower leg: No edema.     Left lower leg: No edema.     Comments: Abduction of RIGHT UPPER EXTREMITY restricted to 60 degrees  Skin:    Comments: Non tender fluctuant cyst of upper back  Neurological:     Mental Status: He is alert and oriented to person, place, and time.  Psychiatric:        Mood and Affect: Mood normal.        Latest Ref Rng & Units 10/07/2021   10:34 AM 10/04/2021    1:40 PM 08/11/2021    3:31 AM  CMP  Glucose  70 - 99 mg/dL 81  236  109   BUN 8 - 23 mg/dL 14  13  20    Creatinine 0.61 - 1.24 mg/dL 1.19  1.15  1.35   Sodium 135 - 145 mmol/L 141  138  139   Potassium 3.5 - 5.1 mmol/L 4.0  4.4  3.6   Chloride 98 - 111 mmol/L 107  107  107   CO2 22 - 32 mmol/L 24  22  23    Calcium 8.9 - 10.3 mg/dL 9.4  8.9  9.1   Total Protein 6.5 - 8.1 g/dL 7.9     Total Bilirubin 0.3 - 1.2 mg/dL 0.7     Alkaline Phos 38 - 126 U/L 73     AST 15 - 41 U/L 12     ALT 0 - 44 U/L 10       Lipid Panel     Component Value Date/Time   CHOL 213 (H) 05/23/2021 0954   TRIG 115 05/23/2021 0954   HDL 71 05/23/2021 0954   CHOLHDL 3.2  11/03/2019 1003   CHOLHDL 3.6 05/22/2016 0852   VLDL 32 (H) 05/03/2015 0954   LDLCALC 122 (H) 05/23/2021 0954    CBC    Component Value Date/Time   WBC 8.9 10/07/2021 1034   RBC 4.35 10/07/2021 1034   HGB 13.7 10/07/2021 1034   HGB 14.3 11/03/2019 1003   HCT 41.9 10/07/2021 1034   HCT 43.4 11/03/2019 1003   PLT 376 10/07/2021 1034   PLT 295 11/03/2019 1003   MCV 96.3 10/07/2021 1034   MCV 94 11/03/2019 1003   MCH 31.5 10/07/2021 1034   MCHC 32.7 10/07/2021 1034   RDW 13.4 10/07/2021 1034   RDW 12.4 11/03/2019 1003   LYMPHSABS 1.5 10/07/2021 1034   LYMPHSABS 1.9 11/03/2019 1003   MONOABS 1.7 (H) 10/07/2021 1034   EOSABS 0.5 10/07/2021 1034   EOSABS 0.3 11/03/2019 1003   BASOSABS 0.0 10/07/2021 1034   BASOSABS 0.1 11/03/2019 1003    Lab Results  Component Value Date   HGBA1C 9.3 (A) 10/08/2021    Assessment & Plan:  1. Uncontrolled type 2 diabetes mellitus with hyperglycemia (HCC) A1c of 9.3 Medication adherence cannot be ascertained as he sometimes is unable to pick up his medications until he gets paid. I will make no regimen changes today but reassess at next visit especially since he says his fasting sugars have been in the low 120s Counseled on Diabetic diet, my plate method, 941 minutes of moderate intensity exercise/week Blood sugar logs with fasting goals of 80-120 mg/dl, random of less than 180 and in the event of sugars less than 60 mg/dl or greater than 400 mg/dl encouraged to notify the clinic. Advised on the need for annual eye exams, annual foot exams, Pneumonia vaccine. - POCT glucose (manual entry) - POCT glycosylated hemoglobin (Hb A1C)  2. Renal stones He has had superimposed UTIs We will initiate Flomax - Ambulatory referral to Urology - tamsulosin (FLOMAX) 0.4 MG CAPS capsule; Take 1 capsule (0.4 mg total) by mouth daily. For renal calculi  Dispense: 30 capsule; Refill: 3  3. Screening for colon cancer - Ambulatory referral to  Gastroenterology  4. Subcutaneous cyst - Ambulatory referral to General Surgery  5. Nonadherence to medication Strongly encouraged to adhere to medication Unfortunately finances are contributing to this    Meds ordered this encounter  Medications   tamsulosin (FLOMAX) 0.4 MG CAPS capsule    Sig: Take 1 capsule (0.4 mg total)  by mouth daily. For renal calculi    Dispense:  30 capsule    Refill:  3    Follow-up: Return in about 3 months (around 01/08/2022) for Chronic medical conditions.       [name], MD, FAAFP. [facility] and [place], [place]   10/08/2021, 10:05 AM

## 2021-10-08 NOTE — Patient Instructions (Signed)

## 2021-10-08 NOTE — Telephone Encounter (Signed)
error 

## 2021-10-09 ENCOUNTER — Telehealth: Payer: Self-pay | Admitting: Emergency Medicine

## 2021-10-09 ENCOUNTER — Telehealth: Payer: Self-pay | Admitting: Licensed Clinical Social Worker

## 2021-10-09 NOTE — Telephone Encounter (Signed)
Integrated Behavioral Health Case Management Referral Note  10/08/21 Name: David Wyatt MRN: [id number] DOB: 1960/12/12 David Wyatt is a 61 y.o. year old male who sees [name], MD for primary care. LCSWA was consulted to assess patient's needs and assist the patient with [employer] .  Interpreter: No.   Interpreter Name & Language: n/a  Assessment: Patient experiencing Limited social support, Housing barriers, Literacy concerns, and Lacks knowledge of community resource: Pt is currently homeless and reports to [employer]. Pt does not like they way he is being treated and would like more stable housing. Patient states that he receives food stamps, SSI and has Medicaid.  Intervention: Pt was given contact information for: Housing Authority- [phone] and [phone] Department of Social Services- [phone] LCSWA will follow up with pt via phone to provide more resources.  SDOH (Social Determinants of Health) assessments performed: No  Review of patient status, including review of consultants reports, relevant laboratory and other test results, and collaboration with appropriate care team members and the patient's provider was performed as part of comprehensive patient evaluation and provision of services.    Rosana Hoes, MSW, [employer] and [facility]

## 2021-10-09 NOTE — Telephone Encounter (Signed)
Post ED Visit - Positive Culture Follow-up  Culture report reviewed by antimicrobial stewardship pharmacist: [place] Team '[]'$  Elenor Quinones, Pharm.D. '[]'$  Heide Guile, Pharm.D., BCPS AQ-ID '[]'$  Parks Neptune, Pharm.D., BCPS '[]'$  Alycia Rossetti, Pharm.D., BCPS '[]'$  [place], Pharm.D., BCPS, AAHIVP '[]'$  Legrand Como, Pharm.D., BCPS, AAHIVP '[]'$  Salome Arnt, PharmD, BCPS '[]'$  Johnnette Gourd, PharmD, BCPS '[]'$  Hughes Better, PharmD, BCPS '[]'$  Leeroy Cha, PharmD '[]'$  Laqueta Linden, PharmD, BCPS '[]'$  Albertina Parr, PharmD  [place] Team '[]'$  Leodis Sias, PharmD '[]'$  Lindell Spar, PharmD '[]'$  Royetta Asal, PharmD '[]'$  Graylin Shiver, Rph '[]'$  Rema Fendt) Glennon Mac, PharmD '[]'$  Arlyn Dunning, PharmD '[]'$  Netta Cedars, PharmD '[]'$  Dia Sitter, PharmD '[]'$  Leone Haven, PharmD '[]'$  Gretta Arab, PharmD '[]'$  Theodis Shove, PharmD '[]'$  Peggyann Juba, PharmD '[]'$  Reuel Boom, PharmD   Positive urine culture Treated with cephalexin, organism sensitive to the same and no further patient follow-up is required at this time.  Hazle Nordmann 10/09/2021, 8:54 AM

## 2021-10-10 ENCOUNTER — Other Ambulatory Visit: Payer: Self-pay

## 2021-10-11 ENCOUNTER — Other Ambulatory Visit (HOSPITAL_COMMUNITY): Payer: Self-pay

## 2021-10-11 NOTE — Telephone Encounter (Signed)
Noted. Patient spoke to [facility], [place] on 10/09/2021.

## 2021-10-13 ENCOUNTER — Encounter (HOSPITAL_COMMUNITY): Payer: Self-pay | Admitting: Family Medicine

## 2021-10-13 ENCOUNTER — Observation Stay (HOSPITAL_COMMUNITY)
Admission: EM | Admit: 2021-10-13 | Discharge: 2021-10-15 | Disposition: A | Discharge status: Home / Self Care | Payer: Medicaid Other | Attending: Internal Medicine | Admitting: Internal Medicine

## 2021-10-13 ENCOUNTER — Other Ambulatory Visit: Payer: Self-pay

## 2021-10-13 ENCOUNTER — Observation Stay (HOSPITAL_COMMUNITY): Payer: Medicaid Other

## 2021-10-13 ENCOUNTER — Emergency Department (HOSPITAL_COMMUNITY): Payer: Medicaid Other

## 2021-10-13 DIAGNOSIS — Z20822 Contact with and (suspected) exposure to covid-19: Secondary | ICD-10-CM | POA: Insufficient documentation

## 2021-10-13 DIAGNOSIS — F17210 Nicotine dependence, cigarettes, uncomplicated: Secondary | ICD-10-CM | POA: Insufficient documentation

## 2021-10-13 DIAGNOSIS — Z79899 Other long term (current) drug therapy: Secondary | ICD-10-CM | POA: Insufficient documentation

## 2021-10-13 DIAGNOSIS — F1490 Cocaine use, unspecified, uncomplicated: Secondary | ICD-10-CM | POA: Diagnosis present

## 2021-10-13 DIAGNOSIS — I10 Essential (primary) hypertension: Secondary | ICD-10-CM | POA: Diagnosis not present

## 2021-10-13 DIAGNOSIS — Z7984 Long term (current) use of oral hypoglycemic drugs: Secondary | ICD-10-CM | POA: Diagnosis not present

## 2021-10-13 DIAGNOSIS — E785 Hyperlipidemia, unspecified: Secondary | ICD-10-CM | POA: Diagnosis present

## 2021-10-13 DIAGNOSIS — J189 Pneumonia, unspecified organism: Secondary | ICD-10-CM | POA: Insufficient documentation

## 2021-10-13 DIAGNOSIS — R5383 Other fatigue: Secondary | ICD-10-CM | POA: Diagnosis present

## 2021-10-13 DIAGNOSIS — Z794 Long term (current) use of insulin: Secondary | ICD-10-CM | POA: Diagnosis not present

## 2021-10-13 DIAGNOSIS — E1165 Type 2 diabetes mellitus with hyperglycemia: Secondary | ICD-10-CM | POA: Diagnosis not present

## 2021-10-13 DIAGNOSIS — Z720 Tobacco use: Secondary | ICD-10-CM | POA: Diagnosis present

## 2021-10-13 DIAGNOSIS — J9601 Acute respiratory failure with hypoxia: Secondary | ICD-10-CM | POA: Diagnosis not present

## 2021-10-13 LAB — COMPREHENSIVE METABOLIC PANEL
ALT: 10 U/L (ref 0–44)
AST: 13 U/L — ABNORMAL LOW (ref 15–41)
Albumin: 3 g/dL — ABNORMAL LOW (ref 3.5–5.0)
Alkaline Phosphatase: 84 U/L (ref 38–126)
Anion gap: 8 (ref 5–15)
BUN: 14 mg/dL (ref 8–23)
CO2: 24 mmol/L (ref 22–32)
Calcium: 9 mg/dL (ref 8.9–10.3)
Chloride: 104 mmol/L (ref 98–111)
Creatinine, Ser: 1.15 mg/dL (ref 0.61–1.24)
GFR, Estimated: >60 mL/min (ref >60)
Glucose, Bld: 296 mg/dL — ABNORMAL HIGH (ref 70–99)
Potassium: 4.3 mmol/L (ref 3.5–5.1)
Sodium: 136 mmol/L (ref 135–145)
Total Bilirubin: 0.5 mg/dL (ref 0.3–1.2)
Total Protein: 7.5 g/dL (ref 6.5–8.1)

## 2021-10-13 LAB — I-STAT VENOUS BLOOD GAS, ED
Acid-Base Excess: 3 mmol/L — ABNORMAL HIGH (ref 0.0–2.0)
Bicarbonate: 26.9 mmol/L (ref 20.0–28.0)
Calcium, Ion: 1.1 mmol/L — ABNORMAL LOW (ref 1.15–1.40)
HCT: 40 % (ref 39.0–52.0)
Hemoglobin: 13.6 g/dL (ref 13.0–17.0)
O2 Saturation: 100 %
Potassium: 4.3 mmol/L (ref 3.5–5.1)
Sodium: 137 mmol/L (ref 135–145)
TCO2: 28 mmol/L (ref 22–32)
pCO2, Ven: 38.1 mmHg — ABNORMAL LOW (ref 44–60)
pH, Ven: 7.457 — ABNORMAL HIGH (ref 7.25–7.43)
pO2, Ven: 174 mmHg — ABNORMAL HIGH (ref 32–45)

## 2021-10-13 LAB — URINALYSIS, ROUTINE W REFLEX MICROSCOPIC
Bilirubin Urine: NEGATIVE
Glucose, UA: >=500 mg/dL — AB
Ketones, ur: NEGATIVE mg/dL
Nitrite: NEGATIVE
Protein, ur: 30 mg/dL — AB
RBC / HPF: >50 RBC/hpf — ABNORMAL HIGH (ref 0–5)
Specific Gravity, Urine: 1.022 (ref 1.005–1.030)
WBC, UA: >50 WBC/hpf — ABNORMAL HIGH (ref 0–5)
pH: 5 (ref 5.0–8.0)

## 2021-10-13 LAB — CBC WITH DIFFERENTIAL/PLATELET
Abs Immature Granulocytes: 0.11 10*3/uL — ABNORMAL HIGH (ref 0.00–0.07)
Basophils Absolute: 0.1 10*3/uL (ref 0.0–0.1)
Basophils Relative: 1 %
Eosinophils Absolute: 0.3 10*3/uL (ref 0.0–0.5)
Eosinophils Relative: 3 %
HCT: 40.4 % (ref 39.0–52.0)
Hemoglobin: 13 g/dL (ref 13.0–17.0)
Immature Granulocytes: 1 %
Lymphocytes Relative: 18 %
Lymphs Abs: 1.7 10*3/uL (ref 0.7–4.0)
MCH: 30.7 pg (ref 26.0–34.0)
MCHC: 32.2 g/dL (ref 30.0–36.0)
MCV: 95.5 fL (ref 80.0–100.0)
Monocytes Absolute: 0.8 10*3/uL (ref 0.1–1.0)
Monocytes Relative: 9 %
Neutro Abs: 6.5 10*3/uL (ref 1.7–7.7)
Neutrophils Relative %: 68 %
Platelets: 551 10*3/uL — ABNORMAL HIGH (ref 150–400)
RBC: 4.23 MIL/uL (ref 4.22–5.81)
RDW: 13.4 % (ref 11.5–15.5)
WBC: 9.5 10*3/uL (ref 4.0–10.5)
nRBC: 0 % (ref 0.0–0.2)

## 2021-10-13 LAB — RAPID URINE DRUG SCREEN, HOSP PERFORMED
Amphetamines: NOT DETECTED
Barbiturates: NOT DETECTED
Benzodiazepines: NOT DETECTED
Cocaine: POSITIVE — AB
Opiates: NOT DETECTED
Tetrahydrocannabinol: NOT DETECTED

## 2021-10-13 LAB — CBG MONITORING, ED: Glucose-Capillary: 388 mg/dL — ABNORMAL HIGH (ref 70–99)

## 2021-10-13 LAB — GLUCOSE, CAPILLARY
Glucose-Capillary: 281 mg/dL — ABNORMAL HIGH (ref 70–99)
Glucose-Capillary: 399 mg/dL — ABNORMAL HIGH (ref 70–99)

## 2021-10-13 LAB — PROCALCITONIN: Procalcitonin: <0.1 ng/mL

## 2021-10-13 LAB — BRAIN NATRIURETIC PEPTIDE: B Natriuretic Peptide: 23.6 pg/mL (ref 0.0–100.0)

## 2021-10-13 LAB — RESP PANEL BY RT-PCR (FLU A&B, COVID) ARPGX2
Influenza A by PCR: NEGATIVE
Influenza B by PCR: NEGATIVE
SARS Coronavirus 2 by RT PCR: NEGATIVE

## 2021-10-13 LAB — STREP PNEUMONIAE URINARY ANTIGEN: Strep Pneumo Urinary Antigen: NEGATIVE

## 2021-10-13 MED ORDER — ACETAMINOPHEN 325 MG PO TABS
650.0000 mg | ORAL_TABLET | Freq: Once | ORAL | Status: AC
  Administered 2021-10-13: 650 mg via ORAL
  Filled 2021-10-13: qty 2

## 2021-10-13 MED ORDER — AZITHROMYCIN 250 MG PO TABS
500.0000 mg | ORAL_TABLET | Freq: Once | ORAL | Status: AC
  Administered 2021-10-13: 500 mg via ORAL
  Filled 2021-10-13: qty 2

## 2021-10-13 MED ORDER — SODIUM CHLORIDE 0.9 % IV SOLN: INTRAVENOUS | Status: DC

## 2021-10-13 MED ORDER — AZITHROMYCIN 250 MG PO TABS
250.0000 mg | ORAL_TABLET | Freq: Every day | ORAL | Status: DC
  Administered 2021-10-14 – 2021-10-15 (×2): 250 mg via ORAL
  Filled 2021-10-13 (×2): qty 1

## 2021-10-13 MED ORDER — IOHEXOL 350 MG/ML SOLN
75.0000 mL | Freq: Once | INTRAVENOUS | Status: AC | PRN
  Administered 2021-10-13: 75 mL via INTRAVENOUS

## 2021-10-13 MED ORDER — SODIUM CHLORIDE 0.9 % IV SOLN: 1.0000 g | INTRAVENOUS | Status: DC

## 2021-10-13 MED ORDER — INSULIN GLARGINE SOLOSTAR 100 UNIT/ML ~~LOC~~ SOPN: 10.0000 [IU] | PEN_INJECTOR | Freq: Every day | SUBCUTANEOUS | Status: DC

## 2021-10-13 MED ORDER — INSULIN ASPART 100 UNIT/ML IJ SOLN
0.0000 [IU] | Freq: Three times a day (TID) | INTRAMUSCULAR | Status: DC
  Administered 2021-10-13 – 2021-10-14 (×2): 5 [IU] via SUBCUTANEOUS
  Administered 2021-10-14: 7 [IU] via SUBCUTANEOUS
  Administered 2021-10-14: 3 [IU] via SUBCUTANEOUS

## 2021-10-13 MED ORDER — SODIUM CHLORIDE 0.9 % IV SOLN: INTRAVENOUS | Status: AC

## 2021-10-13 MED ORDER — ACETAMINOPHEN 325 MG PO TABS
650.0000 mg | ORAL_TABLET | Freq: Four times a day (QID) | ORAL | Status: DC | PRN
  Administered 2021-10-14: 650 mg via ORAL
  Filled 2021-10-13 (×2): qty 2

## 2021-10-13 MED ORDER — SODIUM CHLORIDE 0.9 % IV SOLN
1.0000 g | Freq: Once | INTRAVENOUS | Status: AC
  Administered 2021-10-13: 1 g via INTRAVENOUS
  Filled 2021-10-13: qty 10

## 2021-10-13 MED ORDER — EMPAGLIFLOZIN 10 MG PO TABS
10.0000 mg | ORAL_TABLET | Freq: Every day | ORAL | Status: DC
  Administered 2021-10-14 – 2021-10-15 (×2): 10 mg via ORAL
  Filled 2021-10-13 (×3): qty 1

## 2021-10-13 MED ORDER — ATORVASTATIN CALCIUM 40 MG PO TABS
40.0000 mg | ORAL_TABLET | Freq: Every day | ORAL | Status: DC
  Administered 2021-10-13 – 2021-10-15 (×3): 40 mg via ORAL
  Filled 2021-10-13 (×3): qty 1

## 2021-10-13 MED ORDER — INSULIN GLARGINE-YFGN 100 UNIT/ML ~~LOC~~ SOLN
10.0000 [IU] | Freq: Every day | SUBCUTANEOUS | Status: DC
  Administered 2021-10-13 – 2021-10-15 (×3): 10 [IU] via SUBCUTANEOUS
  Filled 2021-10-13 (×3): qty 0.1

## 2021-10-13 MED ORDER — ENOXAPARIN SODIUM 40 MG/0.4ML IJ SOSY
40.0000 mg | PREFILLED_SYRINGE | INTRAMUSCULAR | Status: DC
  Administered 2021-10-14: 40 mg via SUBCUTANEOUS
  Filled 2021-10-13 (×2): qty 0.4

## 2021-10-13 MED ORDER — ACETAMINOPHEN 650 MG RE SUPP: 650.0000 mg | Freq: Four times a day (QID) | RECTAL | Status: DC | PRN

## 2021-10-13 NOTE — Assessment & Plan Note (Addendum)
Very well controlled, unsure If taking medication Hold norvasc '10mg'$  daily

## 2021-10-13 NOTE — Progress Notes (Signed)
Patient refused Lovenox shot. MD Rogers Blocker, notified

## 2021-10-13 NOTE — H&P (Signed)
History and Physical    Patient: David Wyatt HYI:[id number] DOB: 1960/12/05 DOA: 10/13/2021 DOS: the patient was seen and examined on 10/13/2021 PCP: [name], MD  Patient coming from:  homeless  at shelter    Chief Complaint: weak,fatigue   HPI: [name] is a 61 y.o. male with medical history significant of chronic back pain, HLD, HTN, T2DM, hx of cocaine abuse who presented to Ed with complaints of fatigue. He is a poor historian. Cant really recall why he is here. States he just doesn't feel good. Denies any fevers, shortness of breath or cough. He doesn't remember anything. He did use crack cocaine yesterday. Can't recall if he felt bad after this or today. He can't remember why he called 911. Does feel fatigued.  When EMS arrived oxygen was 88% on RA and was put on 4L oxygen.    Denies any fever/chills, vision changes/headaches, chest pain or palpitations, shortness of breath or cough, abdominal pain, N/V/D, dysuria or leg swelling.    He smokes, does not drink alcohol. +crack cocaine, last used yesterday.   ER Course:  vitals: afebrile, bp: 120/72, HR; 86, RR: 16, oxygen: 94%RA Pertinent labs: platelets 551, covid negative,  CXR: bilateral patchy airspace opacities with lower lobe and central predominance. May represent atypical or viral pneumonia or inflammatory ILD.  CTA chest:  In ED: given rocephin and azithromax. TRH asked to admit.   Review of Systems: As mentioned in the history of present illness. All other systems reviewed and are negative. Past Medical History:  Diagnosis Date   Angioedema of lips 07/28/2012   left upper (07/29/2012)   Arthritis    hands and back   Chronic back pain    Cocaine abuse (HCC)    Diabetic neuropathy (HCC)    High cholesterol    Hypertension    Neuropathy    Type II diabetes mellitus ([place])    Past Surgical History:  Procedure Laterality Date   BACK SURGERY     CYSTOSCOPY W/ URETERAL STENT PLACEMENT Right 02/23/2021    Procedure: CYSTOSCOPY WITH RETROGRADE PYELOGRAM/URETERAL STENT PLACEMENT;  Surgeon: Janith Lima, MD;  Location: [facility];  Service: Urology;  Laterality: Right;   HERNIA REPAIR Right 04/01/2012   I & D EXTREMITY Left 12/13/2012   Procedure: IRRIGATION AND DEBRIDEMENT LEFT THUMB;  Surgeon: Tennis Must, MD;  Location: [place];  Service: Orthopedics;  Laterality: Left;   INCISION AND DRAINAGE OF WOUND     boil on back/notes 07/14/2008 (07/29/2012)   INGUINAL HERNIA REPAIR  04/01/2012   Procedure: HERNIA REPAIR INGUINAL ADULT;  Surgeon: Haywood Lasso, MD;  Location: [place];  Service: General;  Laterality: Right;   INGUINAL HERNIA REPAIR Left 09/06/2016   Procedure: OPEN REPAIR LEFT INGUINAL HERNIA;  Surgeon: Greer Pickerel, MD;  Location: [place];  Service: General;  Laterality: Left;   INSERTION OF MESH Left 09/06/2016   Procedure: INSERTION OF MESH;  Surgeon: Greer Pickerel, MD;  Location: [place];  Service: General;  Laterality: Left;   TONSILLECTOMY     Social History:  reports that he has been smoking cigarettes. He has a 7.50 pack-year smoking history. He quit smokeless tobacco use about 6 years ago. He reports that he does not currently use drugs after having used the following drugs: "Crack" cocaine. He reports that he does not drink alcohol.  Allergies  Allergen Reactions   Lisinopril Other (See Comments)    Lip Swelling, angioedema    Family History  Problem Relation  Age of Onset   Diabetes Mother    Kidney disease Mother    Hyperlipidemia Mother    Hyperlipidemia Father    Diabetes Father     Prior to Admission medications   Medication Sig Start Date End Date Taking? Authorizing Provider  Accu-Chek Softclix Lancets lancets Test blood sugar 2 times daily 03/22/21   Mayers, Cari S, PA-C  Accu-Chek Softclix Lancets lancets Use up to 4 times daily as directed 07/04/21   Barrett, Evelene Croon, PA-C  amLODipine (NORVASC) 10 MG tablet Take 1 tablet (10 mg total) by mouth daily. 10/04/21   Elsie Stain, MD  atorvastatin (LIPITOR) 40 MG tablet Take 1 tablet (40 mg total) by mouth daily. 10/04/21   Elsie Stain, MD  Blood Glucose Monitoring Suppl (ACCU-CHEK GUIDE) w/Device KIT USE AS DIRECTED TO CHECK BLOOD SUGARS UP TO TWICE PER DAY 07/05/21   [name], MD  carbamide peroxide (DEBROX) 6.5 % OTIC solution Place 5 drops into the right ear 2 (two) times daily. Patient not taking: Reported on 09/05/2021 07/04/21 08/03/22  Barrett, Evelene Croon, PA-C  cephALEXin (KEFLEX) 500 MG capsule Take 1 capsule (500 mg total) by mouth 3 (three) times daily for 7 days. 10/07/21 10/17/21  Wyvonnia Dusky, MD  cyclobenzaprine (FLEXERIL) 10 MG tablet Take 1 tablet (10 mg total) by mouth 2 (two) times daily as needed for muscle spasms. 12/26/20   [name], MD  cyclobenzaprine (FLEXERIL) 10 MG tablet Take 1 tablet (10 mg total) by mouth 2 (two) times daily as needed for up to 14 doses for muscle spasms. 10/07/21   Wyvonnia Dusky, MD  DULoxetine (CYMBALTA) 60 MG capsule Take 1 capsule (60 mg total) by mouth daily. 10/04/21   Elsie Stain, MD  empagliflozin (JARDIANCE) 10 MG TABS tablet Take 1 tablet (10 mg total) by mouth daily before breakfast. 10/04/21   Elsie Stain, MD  gabapentin (NEURONTIN) 300 MG capsule Take 2 capsules (600 mg total) by mouth 3 (three) times daily. 10/04/21   Elsie Stain, MD  glipiZIDE (GLUCOTROL XL) 10 MG 24 hr tablet Take 1 tablet (10 mg total) by mouth daily with breakfast. 10/04/21   Elsie Stain, MD  glucose blood (ACCU-CHEK GUIDE) test strip Use up to 3 times daily as directed 09/20/21   Elsie Stain, MD  hydrOXYzine (ATARAX) 25 MG tablet Take 1 tablet (25 mg total) by mouth at bedtime. 10/04/21   Elsie Stain, MD  ibuprofen (ADVIL) 600 MG tablet Take 1 tablet (600 mg total) by mouth every 8 (eight) hours as needed for up to 21 doses for mild pain or moderate pain. 10/07/21   Wyvonnia Dusky, MD  Insulin Glargine Solostar (LANTUS) 100 UNIT/ML  Solostar Pen Inject 24 Units into the skin daily. 10/04/21   Elsie Stain, MD  insulin lispro (HUMALOG) 100 UNIT/ML KwikPen Inject 5 Units into the skin 2 (two) times daily before lunch and supper. 10/04/21   Elsie Stain, MD  Insulin Pen Needle 31G X 8 MM MISC Use with insulin pen 10/04/21   Elsie Stain, MD  meloxicam (MOBIC) 15 MG tablet Take 1 tablet (15 mg total) by mouth daily. 09/05/21   Mayers, Cari S, PA-C  metFORMIN (GLUCOPHAGE) 1000 MG tablet Take 1 tablet (1,000 mg total) by mouth 2 (two) times daily. 10/04/21   Elsie Stain, MD  methocarbamol (ROBAXIN) 500 MG tablet Take 1 tablet (500 mg total) by mouth 3 times/day as needed-between  meals & bedtime for muscle spasms. 07/20/21   Harris, Abigail, PA-C  NEOMYCIN-POLYMYXIN-HYDROCORTISONE (CORTISPORIN) 1 % SOLN OTIC solution Place 3 drops into both ears 4 (four) times daily. 08/15/21   Elsie Stain, MD  tamsulosin (FLOMAX) 0.4 MG CAPS capsule Take 1 capsule (0.4 mg total) by mouth daily. For renal calculi 10/08/21   [name], MD  loratadine (CLARITIN) 10 MG tablet Take 1 tablet (10 mg total) by mouth daily. Patient not taking: No sig reported 06/03/17 07/20/20  [name], MD    Physical Exam: Vitals:   10/13/21 1611 10/13/21 1626 10/13/21 1707 10/13/21 1709  BP: 111/63  121/81 121/81  Pulse: 85  79 79  Resp: (!) 28  18   Temp:  98.6 F (37 C) 97.6 F (36.4 C) 97.6 F (36.4 C)  TempSrc:  Oral Oral Oral  SpO2: 99%  96% 96%  Weight:      Height:       General:  Appears calm and comfortable and is in NAD Eyes:  PERRL, EOMI, normal lids, iris ENT:  grossly normal hearing, lips & tongue, mmm; appropriate dentition Neck:  no LAD, masses or thyromegaly; no carotid bruits Cardiovascular:  RRR, no m/r/g. No LE edema.  Respiratory:   CTA bilaterally with no wheezes/rales/rhonchi.  Normal respiratory effort. Abdomen:  soft, NT, ND, NABS Back:   normal alignment, no CVAT Skin:  no rash or induration seen on  limited exam Musculoskeletal:  grossly normal tone BUE/BLE, good ROM, no bony abnormality Lower extremity:  No LE edema.  Limited foot exam with no ulcerations.  2+ distal pulses. Psychiatric:  grossly normal mood and affect, speech fluent and appropriate, AOx3 Neurologic:  CN 2-12 grossly intact, moves all extremities in coordinated fashion, sensation intact   Radiological Exams on Admission: Independently reviewed - see discussion in A/P where applicable  DG CHEST PORT 1 VIEW  Result Date: 10/13/2021 CLINICAL DATA:  Shortness of breath on exertion.  Extreme weakness. EXAM: PORTABLE CHEST 1 VIEW COMPARISON:  Chest radiograph Jul 25, 2015 FINDINGS: Tortuosity and calcific atherosclerotic disease of the aorta. Cardiomediastinal silhouette is normal. Mediastinal contours appear intact. Bilateral patchy airspace opacities with lower lobe and central predominance. No pneumothorax or pleural effusion seen. Osseous structures are without acute abnormality. Soft tissues are grossly normal. IMPRESSION: Bilateral patchy airspace opacities with lower lobe and central predominance. This may represent atypical/viral pneumonia or inflammatory interstitial lung disease. If no improvement with empiric treatment, recommend further evaluation with high-resolution chest CT. Electronically Signed   By: Fidela Salisbury M.D.   On: 10/13/2021 13:02    EKG: Independently reviewed.  NSR with rate 85; nonspecific ST changes with no evidence of acute ischemia   Labs on Admission: I have personally reviewed the available labs and imaging studies at the time of the admission.  Pertinent labs:   Platelets 515   Assessment and Plan: Principal Problem:   Acute respiratory failure with hypoxia (HCC) Active Problems:   Uncontrolled type 2 diabetes mellitus with hyperglycemia (HCC)   Essential hypertension, benign   Hyperlipidemia   Crack cocaine use   Tobacco abuse    Assessment and Plan: * Acute respiratory  failure with hypoxia ([place]) 61 year old presenting with acute respiratory failure with oxygen to 88% on RA with complaints of fatigue, weakness and requiring 4L oxygen to maintain oxygenation>2L in setting of cocaine use.  -obs to med surg -CXR with patchy bilateral opacities. Viral pneumonia vs. Inflammatory ILD. Low suspicion for pneumonia. -CTA chest  pending -check RVP, covid negative -check urinary studies -check PCT -he has no cough or leukocytosis  -continue antibiotics for now until labs result  -gentle IVF  -feels better and admits to cocaine use which could be contributing   Uncontrolled type 2 diabetes mellitus with hyperglycemia (HCC) Poorly controlled. Last A1C in July was 9.3 Hold oral meds: glipizide, metformin. Continue jardiance  continue lantus 24 (unsure how compliant he is. States he has taken in awhile. Will start him at 10 units, written for 24)   SSI with accuchecks qac/hs    Essential hypertension, benign Very well controlled, unsure If taking medication Hold norvasc 55m daily   Hyperlipidemia Continue lipitor 481mdaily   Crack cocaine use Check UDS admits to using yesterday.  Could be contributing to his SOB.   Tobacco abuse Declines nicotine patch.  Encouraged to quit     Advance Care Planning:   Code Status: Full Code   Consults: none   DVT Prophylaxis: lovenox   Family Communication: none   Severity of Illness: The appropriate patient status for this patient is OBSERVATION. Observation status is judged to be reasonable and necessary in order to provide the required intensity of service to ensure the patient's safety. The patient's presenting symptoms, physical exam findings, and initial radiographic and laboratory data in the context of their medical condition is felt to place them at decreased risk for further clinical deterioration. Furthermore, it is anticipated that the patient will be medically stable for discharge from the hospital  within 2 midnights of admission.   Author: AlOrma FlamingMD 10/13/2021 6:00 PM  For on call review [email or web address]

## 2021-10-13 NOTE — Assessment & Plan Note (Addendum)
61 year old presenting with acute respiratory failure with oxygen to 88% on RA with complaints of fatigue, weakness and requiring 4L oxygen to maintain oxygenation>2L in setting of cocaine use.  -obs to med surg -CXR with patchy bilateral opacities. Viral pneumonia vs. Inflammatory ILD. Low suspicion for pneumonia. -CTA chest pending -check RVP, covid negative -check urinary studies -check PCT -he has no cough or leukocytosis  -continue antibiotics for now until labs result  -gentle IVF  -feels better and admits to cocaine use which could be contributing

## 2021-10-13 NOTE — Assessment & Plan Note (Addendum)
Poorly controlled. Last A1C in July was 9.3 Hold oral meds: glipizide, metformin. Continue jardiance  continue lantus 24 (unsure how compliant he is. States he has taken in awhile. Will start him at 10 units, written for 24)   SSI with accuchecks qac/hs

## 2021-10-13 NOTE — Assessment & Plan Note (Signed)
Continue lipitor 40mg daily. 

## 2021-10-13 NOTE — ED Triage Notes (Addendum)
Pt BIB EMS from outside of [facility] for bilateral lower extremity weakness and "not feeling like himself" & Just tired. Pt was able to move legs with EMS.  Denies SOB, CP and pain   O2 RA was 88%, and 96% 4L  Cbg 496 16 RR  84 NSR

## 2021-10-13 NOTE — ED Provider Notes (Signed)
[facility] EMERGENCY DEPARTMENT Provider Note   CSN: [id number] Arrival date & time: 10/13/21  1156     History  Chief Complaint  Patient presents with   Extremity Weakness    David Wyatt is a 61 y.o. male with a past medical history of chronic shoulder pain, crack cocaine abuse, diabetes, hypertension, hyperlipidemia, renal mass, renal stones, poorly controlled diabetes who is experiencing homelessness and is currently a resident of the [facility].  Patient states that he began feeling fatigued yesterday.  When he awoke he had associated arthralgias" just feel extremely tired."  He is living in a communal setting.  He is unsure if others have been ill.  He denies fever or cough.  He denies any head injuries, urinary symptoms, abdominal pain, nausea or vomiting.  Of note patient was found to be hypoxic with oxygen saturations at 88% on room air now 96% on 4 L.  He does not have a history of need for oxygen supplementation.  He is a daily smoker.  Does not feel significantly short of breath   Extremity Weakness       Home Medications Prior to Admission medications   Medication Sig Start Date End Date Taking? Authorizing Provider  Accu-Chek Softclix Lancets lancets Test blood sugar 2 times daily 03/22/21   Mayers, Cari S, PA-C  Accu-Chek Softclix Lancets lancets Use up to 4 times daily as directed 07/04/21   Barrett, Evelene Croon, PA-C  amLODipine (NORVASC) 10 MG tablet Take 1 tablet (10 mg total) by mouth daily. 10/04/21   Elsie Stain, MD  atorvastatin (LIPITOR) 40 MG tablet Take 1 tablet (40 mg total) by mouth daily. 10/04/21   Elsie Stain, MD  Blood Glucose Monitoring Suppl (ACCU-CHEK GUIDE) w/Device KIT USE AS DIRECTED TO CHECK BLOOD SUGARS UP TO TWICE PER DAY 07/05/21   [name], MD  carbamide peroxide (DEBROX) 6.5 % OTIC solution Place 5 drops into the right ear 2 (two) times daily. Patient not taking: Reported on 09/05/2021 07/04/21  08/03/22  Barrett, Evelene Croon, PA-C  cephALEXin (KEFLEX) 500 MG capsule Take 1 capsule (500 mg total) by mouth 3 (three) times daily for 7 days. 10/07/21 10/17/21  Wyvonnia Dusky, MD  cyclobenzaprine (FLEXERIL) 10 MG tablet Take 1 tablet (10 mg total) by mouth 2 (two) times daily as needed for muscle spasms. 12/26/20   [name], MD  cyclobenzaprine (FLEXERIL) 10 MG tablet Take 1 tablet (10 mg total) by mouth 2 (two) times daily as needed for up to 14 doses for muscle spasms. 10/07/21   Wyvonnia Dusky, MD  DULoxetine (CYMBALTA) 60 MG capsule Take 1 capsule (60 mg total) by mouth daily. 10/04/21   Elsie Stain, MD  empagliflozin (JARDIANCE) 10 MG TABS tablet Take 1 tablet (10 mg total) by mouth daily before breakfast. 10/04/21   Elsie Stain, MD  gabapentin (NEURONTIN) 300 MG capsule Take 2 capsules (600 mg total) by mouth 3 (three) times daily. 10/04/21   Elsie Stain, MD  glipiZIDE (GLUCOTROL XL) 10 MG 24 hr tablet Take 1 tablet (10 mg total) by mouth daily with breakfast. 10/04/21   Elsie Stain, MD  glucose blood (ACCU-CHEK GUIDE) test strip Use up to 3 times daily as directed 09/20/21   Elsie Stain, MD  hydrOXYzine (ATARAX) 25 MG tablet Take 1 tablet (25 mg total) by mouth at bedtime. 10/04/21   Elsie Stain, MD  ibuprofen (ADVIL) 600 MG tablet Take 1  tablet (600 mg total) by mouth every 8 (eight) hours as needed for up to 21 doses for mild pain or moderate pain. 10/07/21   Wyvonnia Dusky, MD  Insulin Glargine Solostar (LANTUS) 100 UNIT/ML Solostar Pen Inject 24 Units into the skin daily. 10/04/21   Elsie Stain, MD  insulin lispro (HUMALOG) 100 UNIT/ML KwikPen Inject 5 Units into the skin 2 (two) times daily before lunch and supper. 10/04/21   Elsie Stain, MD  Insulin Pen Needle 31G X 8 MM MISC Use with insulin pen 10/04/21   Elsie Stain, MD  meloxicam (MOBIC) 15 MG tablet Take 1 tablet (15 mg total) by mouth daily. 09/05/21   Mayers, Cari S, PA-C   metFORMIN (GLUCOPHAGE) 1000 MG tablet Take 1 tablet (1,000 mg total) by mouth 2 (two) times daily. 10/04/21   Elsie Stain, MD  methocarbamol (ROBAXIN) 500 MG tablet Take 1 tablet (500 mg total) by mouth 3 times/day as needed-between meals & bedtime for muscle spasms. 07/20/21   Desirea Mizrahi, PA-C  NEOMYCIN-POLYMYXIN-HYDROCORTISONE (CORTISPORIN) 1 % SOLN OTIC solution Place 3 drops into both ears 4 (four) times daily. 08/15/21   Elsie Stain, MD  tamsulosin (FLOMAX) 0.4 MG CAPS capsule Take 1 capsule (0.4 mg total) by mouth daily. For renal calculi 10/08/21   [name], MD  loratadine (CLARITIN) 10 MG tablet Take 1 tablet (10 mg total) by mouth daily. Patient not taking: No sig reported 06/03/17 07/20/20  [name], MD      Allergies    Lisinopril    Review of Systems   Review of Systems  Musculoskeletal:  Positive for extremity weakness.    Physical Exam Updated Vital Signs BP 111/63   Pulse 85   Temp 98.6 F (37 C) (Oral)   Resp (!) 28   Ht _0  (1.727 m)   Wt 77.1 kg   SpO2 99%   BMI 25.85 kg/m  Physical Exam Vitals and nursing note reviewed.  Constitutional:      General: He is not in acute distress.    Appearance: He is well-developed and underweight. He is not ill-appearing or diaphoretic.     Interventions: Nasal cannula in place.  HENT:     Head: Normocephalic and atraumatic.     Mouth/Throat:     Comments: Extremely malodorous exhalation Eyes:     General: No scleral icterus.    Conjunctiva/sclera: Conjunctivae normal.  Cardiovascular:     Rate and Rhythm: Normal rate and regular rhythm.     Heart sounds: Normal heart sounds.  Pulmonary:     Effort: Pulmonary effort is normal. No tachypnea, accessory muscle usage, prolonged expiration or respiratory distress.     Breath sounds: No wheezing or rhonchi.  Abdominal:     Palpations: Abdomen is soft.     Tenderness: There is no abdominal tenderness.  Musculoskeletal:     Cervical back:  Normal range of motion and neck supple.  Skin:    General: Skin is warm and dry.  Neurological:     Mental Status: He is alert.  Psychiatric:        Behavior: Behavior normal.     ED Results / Procedures / Treatments   Labs (all labs ordered are listed, but only abnormal results are displayed) Labs Reviewed  CBC WITH DIFFERENTIAL/PLATELET - Abnormal; Notable for the following components:      Result Value   Platelets 551 (*)    Abs Immature Granulocytes 0.11 (*)  All other components within normal limits  COMPREHENSIVE METABOLIC PANEL - Abnormal; Notable for the following components:   Glucose, Bld 296 (*)    Albumin 3.0 (*)    AST 13 (*)    All other components within normal limits  URINALYSIS, ROUTINE W REFLEX MICROSCOPIC - Abnormal; Notable for the following components:   APPearance CLOUDY (*)    Glucose, UA >=500 (*)    Hgb urine dipstick LARGE (*)    Protein, ur 30 (*)    Leukocytes,Ua LARGE (*)    RBC / HPF >50 (*)    WBC, UA >50 (*)    Bacteria, UA RARE (*)    Non Squamous Epithelial 0-5 (*)    All other components within normal limits  CBG MONITORING, ED - Abnormal; Notable for the following components:   Glucose-Capillary 388 (*)    All other components within normal limits  I-STAT VENOUS BLOOD GAS, ED - Abnormal; Notable for the following components:   pH, Ven 7.457 (*)    pCO2, Ven 38.1 (*)    pO2, Ven 174 (*)    Acid-Base Excess 3.0 (*)    Calcium, Ion 1.10 (*)    All other components within normal limits  RESP PANEL BY RT-PCR (FLU A&B, COVID) ARPGX2  RESPIRATORY PANEL BY PCR  RAPID URINE DRUG SCREEN, HOSP PERFORMED  PROCALCITONIN  BRAIN NATRIURETIC PEPTIDE  LEGIONELLA PNEUMOPHILA SEROGP 1 UR AG  STREP PNEUMONIAE URINARY ANTIGEN    EKG None  Radiology DG CHEST PORT 1 VIEW  Result Date: 10/13/2021 CLINICAL DATA:  Shortness of breath on exertion.  Extreme weakness. EXAM: PORTABLE CHEST 1 VIEW COMPARISON:  Chest radiograph Jul 25, 2015 FINDINGS:  Tortuosity and calcific atherosclerotic disease of the aorta. Cardiomediastinal silhouette is normal. Mediastinal contours appear intact. Bilateral patchy airspace opacities with lower lobe and central predominance. No pneumothorax or pleural effusion seen. Osseous structures are without acute abnormality. Soft tissues are grossly normal. IMPRESSION: Bilateral patchy airspace opacities with lower lobe and central predominance. This may represent atypical/viral pneumonia or inflammatory interstitial lung disease. If no improvement with empiric treatment, recommend further evaluation with high-resolution chest CT. Electronically Signed   By: Fidela Salisbury M.D.   On: 10/13/2021 13:02    Procedures .Critical Care  Performed by: Margarita Mail, PA-C Authorized by: Margarita Mail, PA-C   Critical care provider statement:    Critical care time (minutes):  30   Critical care time was exclusive of:  Separately billable procedures and treating other patients   Critical care was necessary to treat or prevent imminent or life-threatening deterioration of the following conditions:  Respiratory failure   Critical care was time spent personally by me on the following activities:  Development of treatment plan with patient or surrogate, discussions with consultants, evaluation of patient's response to treatment, examination of patient, ordering and review of laboratory studies, ordering and review of radiographic studies, ordering and performing treatments and interventions, pulse oximetry, re-evaluation of patient's condition and review of old charts     Medications Ordered in ED Medications  insulin aspart (novoLOG) injection 0-9 Units (has no administration in time range)  enoxaparin (LOVENOX) injection 40 mg (has no administration in time range)  0.9 %  sodium chloride infusion (has no administration in time range)  acetaminophen (TYLENOL) tablet 650 mg (has no administration in time range)    Or   acetaminophen (TYLENOL) suppository 650 mg (has no administration in time range)  acetaminophen (TYLENOL) tablet 650 mg (650 mg Oral Given  10/13/21 1242)  cefTRIAXone (ROCEPHIN) 1 g in sodium chloride 0.9 % 100 mL IVPB (1 g Intravenous New Bag/Given 10/13/21 1546)  azithromycin (ZITHROMAX) tablet 500 mg (500 mg Oral Given 10/13/21 1546)    ED Course/ Medical Decision Making/ A&P                           Medical Decision Making 61 year old male who presents with weakness. The differential diagnosis of weakness includes but is not limited to neurologic causes (GBS, myasthenia gravis, CVA, MS, ALS, transverse myelitis, spinal cord injury, CVA, botulism, ) and other causes: ACS, Arrhythmia, syncope, orthostatic hypotension, sepsis, hypoglycemia, electrolyte disturbance, hypothyroidism, respiratory failure, symptomatic anemia, dehydration, heat injury, polypharmacy, malignancy.  Initially I had high suspicion that patient may have COVID pneumonia given acute hypoxia and generalized fatigue.  After review of all labs and data points although patient does have diffuse interstitial lung findings on chest x-ray which I visualized and personally interpreted , he is COVID-negative. We will treat for community-acquired pneumonia.  He is still requiring oxygen.  He has no focal weakness.  Patient does have a history of crack cocaine abuse and could have a pneumonitis.  Discussed the case with Dr. Eliberto Ivory who will admit the patient. He is not requiring significant respiratory support outside of nasal cannula at this time. Urine appears to be significantly contaminated this because it could also be contributing to his findings and review of his past medical history shows he has a renal mass which could be part of what is going on here.  CBC without elevated white blood cell count, CMP shows a blood glucose of 296.  Amount and/or Complexity of Data Reviewed Labs: ordered.    Details: As discussed in the good  decision making Radiology: ordered and independent interpretation performed. Discussion of management or test interpretation with external provider(s): EKG shows normal sinus rhythm  Risk OTC drugs. Prescription drug management. Decision regarding hospitalization.           Final Clinical Impression(s) / ED Diagnoses Final diagnoses:  Community acquired pneumonia, unspecified laterality  Acute respiratory failure with hypoxia [facility])    Rx / DC Orders ED Discharge Orders     None         Margarita Mail, PA-C 10/13/21 [place], [place], DO 10/14/21 [phone]

## 2021-10-13 NOTE — ED Notes (Signed)
Tele tracking placed 

## 2021-10-13 NOTE — Assessment & Plan Note (Signed)
Declines nicotine patch.  Encouraged to quit

## 2021-10-13 NOTE — Assessment & Plan Note (Addendum)
Check UDS admits to using yesterday.  Could be contributing to his SOB.

## 2021-10-13 NOTE — ED Notes (Signed)
Pt on 2L for sleep apnea

## 2021-10-14 DIAGNOSIS — J9601 Acute respiratory failure with hypoxia: Secondary | ICD-10-CM

## 2021-10-14 LAB — CBC
HCT: 38.8 % — ABNORMAL LOW (ref 39.0–52.0)
Hemoglobin: 12.6 g/dL — ABNORMAL LOW (ref 13.0–17.0)
MCH: 30.6 pg (ref 26.0–34.0)
MCHC: 32.5 g/dL (ref 30.0–36.0)
MCV: 94.2 fL (ref 80.0–100.0)
Platelets: 556 10*3/uL — ABNORMAL HIGH (ref 150–400)
RBC: 4.12 MIL/uL — ABNORMAL LOW (ref 4.22–5.81)
RDW: 13.4 % (ref 11.5–15.5)
WBC: 9.6 10*3/uL (ref 4.0–10.5)
nRBC: 0 % (ref 0.0–0.2)

## 2021-10-14 LAB — BASIC METABOLIC PANEL
Anion gap: 7 (ref 5–15)
BUN: 14 mg/dL (ref 8–23)
CO2: 25 mmol/L (ref 22–32)
Calcium: 8.9 mg/dL (ref 8.9–10.3)
Chloride: 106 mmol/L (ref 98–111)
Creatinine, Ser: 0.98 mg/dL (ref 0.61–1.24)
GFR, Estimated: >60 mL/min (ref >60)
Glucose, Bld: 286 mg/dL — ABNORMAL HIGH (ref 70–99)
Potassium: 4.3 mmol/L (ref 3.5–5.1)
Sodium: 138 mmol/L (ref 135–145)

## 2021-10-14 LAB — RESPIRATORY PANEL BY PCR

## 2021-10-14 LAB — GLUCOSE, CAPILLARY
Glucose-Capillary: 239 mg/dL — ABNORMAL HIGH (ref 70–99)
Glucose-Capillary: 345 mg/dL — ABNORMAL HIGH (ref 70–99)
Glucose-Capillary: 383 mg/dL — ABNORMAL HIGH (ref 70–99)

## 2021-10-14 MED ORDER — INSULIN ASPART 100 UNIT/ML IJ SOLN
0.0000 [IU] | Freq: Every day | INTRAMUSCULAR | Status: DC
  Administered 2021-10-14: 5 [IU] via SUBCUTANEOUS

## 2021-10-14 MED ORDER — AMOXICILLIN-POT CLAVULANATE 875-125 MG PO TABS: 1.0000 | ORAL_TABLET | Freq: Two times a day (BID) | ORAL | 0 refills | Status: AC

## 2021-10-14 MED ORDER — INSULIN ASPART 100 UNIT/ML IJ SOLN
0.0000 [IU] | Freq: Three times a day (TID) | INTRAMUSCULAR | Status: DC
  Administered 2021-10-15: 2 [IU] via SUBCUTANEOUS
  Administered 2021-10-15: 7 [IU] via SUBCUTANEOUS
  Administered 2021-10-15: 2 [IU] via SUBCUTANEOUS

## 2021-10-14 MED ORDER — AMOXICILLIN-POT CLAVULANATE 875-125 MG PO TABS
1.0000 | ORAL_TABLET | Freq: Two times a day (BID) | ORAL | Status: DC
  Administered 2021-10-14 – 2021-10-15 (×3): 1 via ORAL
  Filled 2021-10-14 (×3): qty 1

## 2021-10-14 NOTE — Progress Notes (Signed)
Case manager informed nursing staff that patient informed her of suicidal ideations. RN assessed patient. Patient states "I am going to kill myself right now if I don't speak to [facility]. They are trying to make me go back to that place." Anderson Malta is Financial controller). Patient asked suicidal screening questions. Patient informs staff that he will not answer any of my questions at this time.   RN notified Avon Gully, MD of statement made to case manager. Avon Gully, MD placed order for psychiatry consult.   High risk suicidal protocol implemented.

## 2021-10-14 NOTE — Progress Notes (Signed)
   10/14/21 1315  Clinical Encounter Type  Visited With Health care provider  Visit Type Initial  Referral From Nurse  Consult/Referral To Chaplain  Spiritual Encounters  Spiritual Needs Emotional   Chaplain received called from Nurse Anderson Malta because of patient's state of mind. When Chaplain arrived, patient had threatened suicide and sitter protocol was implemented.  Melody Haver, Resident Chaplain [phone]

## 2021-10-14 NOTE — Progress Notes (Signed)
   10/14/21 1315  Clinical Encounter Type  Visited With Health care provider  Visit Type Initial  Referral From Nurse  Consult/Referral To [place] followed up on call from nurse  Anderson Malta) who indicated that patient was ready to give up on life. Follow-up protocols were implemented, following patient's threat to commit suicide. Chaplain unable to speak with patient.   Melody Haver, Resident Chaplain [phone]

## 2021-10-14 NOTE — Discharge Summary (Addendum)
Physician Discharge Summary  David Wyatt ERX:[id number] DOB: 14-May-1960 DOA: 10/13/2021  PCP: [name], MD  Admit date: 10/13/2021 Discharge date: 10/14/2021  Admitted From: Home Disposition: Home  Recommendations for Outpatient Follow-up:  Follow up with PCP in 1-2 weeks  Equipment/Devices: Walker  Discharge Condition: Stable CODE STATUS: Full Diet recommendation: As tolerated  Brief/Interim Summary: David Wyatt is a 61 y.o. male with medical history significant of chronic back pain, HLD, HTN, T2DM, hx of cocaine abuse who presented to [facility] with complaints of fatigue.  Patient was noted to be minimally hypoxic at intake initially placed on 4 L nasal cannula and able to wean to room air over the subsequent 12 hours.  Patient's admission was somewhat complicated given his mental status and inability to recall recent events.  His UDS was cocaine positive concerning for acute intoxication which appears to have resolved at this point he is otherwise back to baseline denies nausea vomiting diarrhea constipation headache fevers chills or chest pain.  He does have some weakness and mild dyspnea with exertion but is without overt hypoxia or respiratory distress, as such we will discharge patient with walker and remainder of antibiotics to treat his likely community-acquired pneumonia versus aspiration in the setting of intoxication.  He does have PCP locally which we recommended that he follow-up in the next few days for complaints of chronic right shoulder pain.  Patient otherwise stable for discharge.  **Late evening  update** Patient stated "I am going to kill myself if you make me leave" - psych consulted; 1:1 placed - medically stable for discharge pending psych evaluation/clearance.  Discharge Diagnoses:  Principal Problem:   Acute respiratory failure with hypoxia ([place]) Active Problems:   Uncontrolled type 2 diabetes mellitus with hyperglycemia (HCC)   Essential hypertension, benign    Hyperlipidemia   Crack cocaine use   Tobacco abuse    Discharge Instructions   Allergies as of 10/14/2021       Reactions   Lisinopril Other (See Comments)   Lip Swelling, angioedema        Medication List     STOP taking these medications    carbamide peroxide 6.5 % OTIC solution Commonly known as: DEBROX   cyclobenzaprine 10 MG tablet Commonly known as: FLEXERIL   ibuprofen 600 MG tablet Commonly known as: ADVIL       TAKE these medications    Accu-Chek Guide test strip Generic drug: glucose blood Use up to 3 times daily as directed   Accu-Chek Guide w/Device Kit USE AS DIRECTED TO CHECK BLOOD SUGARS UP TO TWICE PER DAY   Accu-Chek Softclix Lancets lancets Use up to 4 times daily as directed What changed: Another medication with the same name was removed. Continue taking this medication, and follow the directions you see here.   amLODipine 10 MG tablet Commonly known as: NORVASC Take 1 tablet (10 mg total) by mouth daily.   amoxicillin-clavulanate 875-125 MG tablet Commonly known as: AUGMENTIN Take 1 tablet by mouth every 12 (twelve) hours for 4 days.   atorvastatin 40 MG tablet Commonly known as: LIPITOR Take 1 tablet (40 mg total) by mouth daily.   B-D ULTRAFINE III SHORT PEN 31G X 8 MM Misc Generic drug: Insulin Pen Needle Use with insulin pen   DULoxetine 60 MG capsule Commonly known as: Cymbalta Take 1 capsule (60 mg total) by mouth daily.   empagliflozin 10 MG Tabs tablet Commonly known as: Jardiance Take 1 tablet (10 mg total) by mouth daily before breakfast.  gabapentin 300 MG capsule Commonly known as: NEURONTIN Take 2 capsules (600 mg total) by mouth 3 (three) times daily.   glipiZIDE 10 MG 24 hr tablet Commonly known as: GLUCOTROL XL Take 1 tablet (10 mg total) by mouth daily with breakfast.   hydrOXYzine 25 MG tablet Commonly known as: ATARAX Take 1 tablet (25 mg total) by mouth at bedtime.   Insulin Glargine Solostar  100 UNIT/ML Solostar Pen Commonly known as: LANTUS Inject 24 Units into the skin daily.   insulin lispro 100 UNIT/ML KwikPen Commonly known as: HUMALOG Inject 5 Units into the skin 2 (two) times daily before lunch and supper.   meloxicam 15 MG tablet Commonly known as: MOBIC Take 1 tablet (15 mg total) by mouth daily.   metFORMIN 1000 MG tablet Commonly known as: GLUCOPHAGE Take 1 tablet (1,000 mg total) by mouth 2 (two) times daily.   methocarbamol 500 MG tablet Commonly known as: ROBAXIN Take 1 tablet (500 mg total) by mouth 3 times/day as needed-between meals & bedtime for muscle spasms.   neomycin-polymyxin-hydrocortisone OTIC solution Commonly known as: CORTISPORIN Place 3 drops into both ears 4 (four) times daily.   tamsulosin 0.4 MG Caps capsule Commonly known as: FLOMAX Take 1 capsule (0.4 mg total) by mouth daily. For renal calculi               Durable Medical Equipment  (From admission, onward)           Start     Ordered   10/14/21 1152  For home use only DME Walker rolling  Once       Question Answer Comment  Walker: With 5 Inch Wheels   Patient needs a walker to treat with the following condition Weakness      10/14/21 1151            Allergies  Allergen Reactions   Lisinopril Other (See Comments)    Lip Swelling, angioedema    Consultations: None  Procedures/Studies: CT Angio Chest PE W and/or Wo Contrast  Result Date: 10/13/2021 CLINICAL DATA:  Chest pain, short of breath EXAM: CT ANGIOGRAPHY CHEST WITH CONTRAST TECHNIQUE: Multidetector CT imaging of the chest was performed using the standard protocol during bolus administration of intravenous contrast. Multiplanar CT image reconstructions and MIPs were obtained to evaluate the vascular anatomy. RADIATION DOSE REDUCTION: This exam was performed according to the departmental dose-optimization program which includes automated exposure control, adjustment of the mA and/or kV according  to patient size and/or use of iterative reconstruction technique. CONTRAST:  26m OMNIPAQUE IOHEXOL 350 MG/ML SOLN COMPARISON:  10/13/2021, 01/30/2012 FINDINGS: Cardiovascular: There is technically adequate opacification of the pulmonary vasculature. No filling defects or pulmonary emboli. The heart is unremarkable without pericardial effusion. No evidence of thoracic aortic aneurysm or dissection. Mediastinum/Nodes: No enlarged mediastinal, hilar, or axillary lymph nodes. Thyroid gland, trachea, and esophagus demonstrate no significant findings. Lungs/Pleura: Mild upper lobe predominant emphysematous change. Basilar predominant subpleural scarring has progressed since prior exam. No acute airspace disease, effusion, or pneumothorax. Central airways are patent. Upper Abdomen: No acute abnormality. Musculoskeletal: No acute or destructive bony lesions. Reconstructed images demonstrate no additional findings. Review of the MIP images confirms the above findings. IMPRESSION: 1. No acute intrathoracic describes that no evidence of pulmonary embolus. 2. Mild emphysema, with progressive bibasilar subpleural scarring. If further evaluation is desired, nonemergent outpatient high-resolution chest CT could be considered. 3. No acute intrathoracic process. Electronically Signed   By: MRanda NgoM.D.   On:  10/13/2021 18:31   DG CHEST PORT 1 VIEW  Result Date: 10/13/2021 CLINICAL DATA:  Shortness of breath on exertion.  Extreme weakness. EXAM: PORTABLE CHEST 1 VIEW COMPARISON:  Chest radiograph Jul 25, 2015 FINDINGS: Tortuosity and calcific atherosclerotic disease of the aorta. Cardiomediastinal silhouette is normal. Mediastinal contours appear intact. Bilateral patchy airspace opacities with lower lobe and central predominance. No pneumothorax or pleural effusion seen. Osseous structures are without acute abnormality. Soft tissues are grossly normal. IMPRESSION: Bilateral patchy airspace opacities with lower lobe and  central predominance. This may represent atypical/viral pneumonia or inflammatory interstitial lung disease. If no improvement with empiric treatment, recommend further evaluation with high-resolution chest CT. Electronically Signed   By: Fidela Salisbury M.D.   On: 10/13/2021 13:02   DG Shoulder Right  Result Date: 10/07/2021 CLINICAL DATA:  Acute RIGHT shoulder pain for 2 months. No known injury. EXAM: RIGHT SHOULDER - 3 VIEW COMPARISON:  07/20/2021 radiograph, 12/30/2020 CT and prior studies FINDINGS: There is no evidence of acute fracture, subluxation or dislocation. Mild degenerative changes at the [facility] joint again noted. No suspicious focal bony lesions are identified. IMPRESSION: No evidence of acute abnormality. Mild AC joint degenerative changes. If there is clinical suspicion for rotator cuff abnormality, recommend elective MRI. Electronically Signed   By: Margarette Canada M.D.   On: 10/07/2021 11:09   CT Head Wo Contrast  Result Date: 10/07/2021 CLINICAL DATA:  61 year old male presenting for evaluation of headache, new or worsening symptoms. EXAM: CT HEAD WITHOUT CONTRAST TECHNIQUE: Contiguous axial images were obtained from the base of the skull through the vertex without intravenous contrast. RADIATION DOSE REDUCTION: This exam was performed according to the departmental dose-optimization program which includes automated exposure control, adjustment of the mA and/or kV according to patient size and/or use of iterative reconstruction technique. COMPARISON:  12/30/2020. FINDINGS: Brain: No evidence of acute infarction, hemorrhage, hydrocephalus, extra-axial collection or mass lesion/mass effect. Signs of atrophy and patchy areas of hypoattenuation in deep white matter with similar distribution compared to previous imaging. Vascular: No hyperdense vessel or unexpected calcification. Skull: Normal. Negative for fracture or focal lesion. Sinuses/Orbits: Visualized paranasal sinuses and orbits without  acute process. Other: None IMPRESSION: 1. No acute intracranial abnormality. 2. Signs of atrophy and patchy areas of hypoattenuation in deep white matter with similar distribution compared to previous imaging, most commonly due to chronic microvascular ischemic changes. Electronically Signed   By: Zetta Bills M.D.   On: 10/07/2021 10:41     Subjective: No acute issues or events overnight denies nausea vomiting diarrhea constipation headache fevers chills shortness of breath or chest pain.  Complaint of right shoulder pain with range of motion, chronic.   Discharge Exam: Vitals:   10/14/21 0837 10/14/21 1100  BP: 108/68   Pulse: 81   Resp: 17   Temp: 98.3 F (36.8 C)   SpO2: 93% 97%   Vitals:   10/13/21 1709 10/13/21 2100 10/14/21 0837 10/14/21 1100  BP: 121/81 108/62 108/68   Pulse: 79 74 81   Resp:   17   Temp: 97.6 F (36.4 C) 98 F (36.7 C) 98.3 F (36.8 C)   TempSrc: Oral Oral Oral   SpO2: 96% 97% 93% 97%  Weight:      Height:        General: Pt is alert, awake, not in acute distress Cardiovascular: RRR, S1/S2 +, no rubs, no gallops Respiratory: CTA bilaterally, no wheezing, no rhonchi Abdominal: Soft, NT, ND, bowel sounds + Extremities: no edema,  no cyanosis, right shoulder tender to palpate anteriorly at the [facility] joint    The results of significant diagnostics from this hospitalization (including imaging, microbiology, ancillary and laboratory) are listed below for reference.     Microbiology: Recent Results (from the past 240 hour(s))  Urine Culture     Status: Abnormal   Collection Time: 10/07/21 12:54 PM   Specimen: Urine, Clean Catch  Result Value Ref Range Status   Specimen Description   Final    URINE, CLEAN CATCH Performed at [facility], [place] [address] [place], [place] [address]    Special Requests   Final    NONE Performed at [facility], [place] [address], [place], [place] [address]    Culture (A)  Final     40,000 COLONIES/mL YEAST 20,000 COLONIES/mL LACTOBACILLUS SPECIES Standardized susceptibility testing for this organism is not available. Performed at [facility], [place] [address] [place], [place] [address]    Report Status 10/08/2021 FINAL  Final  Resp Panel by RT-PCR (Flu A&B, Covid) Anterior Nasal Swab     Status: None   Collection Time: 10/13/21 12:37 PM   Specimen: Anterior Nasal Swab  Result Value Ref Range Status   SARS Coronavirus 2 by RT PCR NEGATIVE NEGATIVE Final    Comment: (NOTE) SARS-CoV-2 target nucleic acids are NOT DETECTED.  The SARS-CoV-2 RNA is generally detectable in upper respiratory specimens during the acute phase of infection. The lowest concentration of SARS-CoV-2 viral copies this assay can detect is 138 copies/mL. A negative result does not preclude SARS-Cov-2 infection and should not be used as the sole basis for treatment or other patient management decisions. A negative result may occur with  improper specimen collection/handling, submission of specimen other than nasopharyngeal swab, presence of viral mutation(s) within the areas targeted by this assay, and inadequate number of viral copies(<138 copies/mL). A negative result must be combined with clinical observations, patient history, and epidemiological information. The expected result is Negative.  Fact Sheet for Patients:  EntrepreneurPulse.com.au  Fact Sheet for Healthcare Providers:  IncredibleEmployment.be  This test is no t yet approved or cleared by the [place] FDA and  has been authorized for detection and/or diagnosis of SARS-CoV-2 by FDA under an Emergency Use Authorization (EUA). This EUA will remain  in effect (meaning this test can be used) for the duration of the COVID-19 declaration under Section 564(b)(1) of the Act, 21 U.S.C.section 360bbb-3(b)(1), unless the authorization is terminated  or revoked sooner.       Influenza A  by PCR NEGATIVE NEGATIVE Final   Influenza B by PCR NEGATIVE NEGATIVE Final    Comment: (NOTE) The Xpert Xpress SARS-CoV-2/FLU/RSV plus assay is intended as an aid in the diagnosis of influenza from Nasopharyngeal swab specimens and should not be used as a sole basis for treatment. Nasal washings and aspirates are unacceptable for Xpert Xpress SARS-CoV-2/FLU/RSV testing.  Fact Sheet for Patients: EntrepreneurPulse.com.au  Fact Sheet for Healthcare Providers: IncredibleEmployment.be  This test is not yet approved or cleared by the [place] FDA and has been authorized for detection and/or diagnosis of SARS-CoV-2 by FDA under an Emergency Use Authorization (EUA). This EUA will remain in effect (meaning this test can be used) for the duration of the COVID-19 declaration under Section 564(b)(1) of the Act, 21 U.S.C. section 360bbb-3(b)(1), unless the authorization is terminated or revoked.  Performed at [facility], [place] [address] [place], [place] [address]      Labs:  BNP (last 3 results) Recent Labs    10/13/21 1237  BNP 83.4   Basic Metabolic Panel: Recent Labs  Lab 10/13/21 1237 10/13/21 1314 10/14/21 0552  NA 136 137 138  K 4.3 4.3 4.3  CL 104  --  106  CO2 24  --  25  GLUCOSE 296*  --  286*  BUN 14  --  14  CREATININE 1.15  --  0.98  CALCIUM 9.0  --  8.9   Liver Function Tests: Recent Labs  Lab 10/13/21 1237  AST 13*  ALT 10  ALKPHOS 84  BILITOT 0.5  PROT 7.5  ALBUMIN 3.0*   No results for input(s): "LIPASE", "AMYLASE" in the last 168 hours. No results for input(s): "AMMONIA" in the last 168 hours. CBC: Recent Labs  Lab 10/13/21 1237 10/13/21 1314 10/14/21 0552  WBC 9.5  --  9.6  NEUTROABS 6.5  --   --   HGB 13.0 13.6 12.6*  HCT 40.4 40.0 38.8*  MCV 95.5  --  94.2  PLT 551*  --  556*   Cardiac Enzymes: No results for input(s): "CKTOTAL", "CKMB", "CKMBINDEX", "TROPONINI" in the last 168  hours. BNP: Invalid input(s): "POCBNP" CBG: Recent Labs  Lab 10/13/21 1159 10/13/21 1712 10/13/21 2144 10/14/21 0759  GLUCAP 388* 281* 399* 239*   D-Dimer No results for input(s): "DDIMER" in the last 72 hours. Hgb A1c No results for input(s): "HGBA1C" in the last 72 hours. Lipid Profile No results for input(s): "CHOL", "HDL", "LDLCALC", "TRIG", "CHOLHDL", "LDLDIRECT" in the last 72 hours. Thyroid function studies No results for input(s): "TSH", "T4TOTAL", "T3FREE", "THYROIDAB" in the last 72 hours.  Invalid input(s): "FREET3" Anemia work up No results for input(s): "VITAMINB12", "FOLATE", "FERRITIN", "TIBC", "IRON", "RETICCTPCT" in the last 72 hours. Urinalysis    Component Value Date/Time   COLORURINE YELLOW 10/13/2021 1245   APPEARANCEUR CLOUDY (A) 10/13/2021 1245   LABSPEC 1.022 10/13/2021 1245   PHURINE 5.0 10/13/2021 1245   GLUCOSEU >=500 (A) 10/13/2021 1245   HGBUR LARGE (A) 10/13/2021 1245   BILIRUBINUR NEGATIVE 10/13/2021 1245   BILIRUBINUR negative 03/22/2021 1327   BILIRUBINUR small 02/16/2019 1618   KETONESUR NEGATIVE 10/13/2021 1245   PROTEINUR 30 (A) 10/13/2021 1245   UROBILINOGEN 0.2 03/22/2021 1327   UROBILINOGEN 1.0 10/12/2012 0149   NITRITE NEGATIVE 10/13/2021 1245   LEUKOCYTESUR LARGE (A) 10/13/2021 1245   Sepsis Labs Recent Labs  Lab 10/13/21 1237 10/14/21 0552  WBC 9.5 9.6   Microbiology Recent Results (from the past 240 hour(s))  Urine Culture     Status: Abnormal   Collection Time: 10/07/21 12:54 PM   Specimen: Urine, Clean Catch  Result Value Ref Range Status   Specimen Description   Final    URINE, CLEAN CATCH Performed at [facility], [place] [address] [place], [place] [address]    Special Requests   Final    NONE Performed at [facility], [place] [address] [place], [place] [address]    Culture (A)  Final    40,000 COLONIES/mL YEAST 20,000 COLONIES/mL LACTOBACILLUS SPECIES Standardized  susceptibility testing for this organism is not available. Performed at [facility], [place] [address], [place], [place] [address]    Report Status 10/08/2021 FINAL  Final  Resp Panel by RT-PCR (Flu A&B, Covid) Anterior Nasal Swab     Status: None   Collection Time: 10/13/21 12:37 PM   Specimen: Anterior Nasal Swab  Result Value Ref Range Status   SARS  Coronavirus 2 by RT PCR NEGATIVE NEGATIVE Final    Comment: (NOTE) SARS-CoV-2 target nucleic acids are NOT DETECTED.  The SARS-CoV-2 RNA is generally detectable in upper respiratory specimens during the acute phase of infection. The lowest concentration of SARS-CoV-2 viral copies this assay can detect is 138 copies/mL. A negative result does not preclude SARS-Cov-2 infection and should not be used as the sole basis for treatment or other patient management decisions. A negative result may occur with  improper specimen collection/handling, submission of specimen other than nasopharyngeal swab, presence of viral mutation(s) within the areas targeted by this assay, and inadequate number of viral copies(<138 copies/mL). A negative result must be combined with clinical observations, patient history, and epidemiological information. The expected result is Negative.  Fact Sheet for Patients:  EntrepreneurPulse.com.au  Fact Sheet for Healthcare Providers:  IncredibleEmployment.be  This test is no t yet approved or cleared by the [place] FDA and  has been authorized for detection and/or diagnosis of SARS-CoV-2 by FDA under an Emergency Use Authorization (EUA). This EUA will remain  in effect (meaning this test can be used) for the duration of the COVID-19 declaration under Section 564(b)(1) of the Act, 21 U.S.C.section 360bbb-3(b)(1), unless the authorization is terminated  or revoked sooner.       Influenza A by PCR NEGATIVE NEGATIVE Final   Influenza B by PCR NEGATIVE NEGATIVE Final     Comment: (NOTE) The Xpert Xpress SARS-CoV-2/FLU/RSV plus assay is intended as an aid in the diagnosis of influenza from Nasopharyngeal swab specimens and should not be used as a sole basis for treatment. Nasal washings and aspirates are unacceptable for Xpert Xpress SARS-CoV-2/FLU/RSV testing.  Fact Sheet for Patients: EntrepreneurPulse.com.au  Fact Sheet for Healthcare Providers: IncredibleEmployment.be  This test is not yet approved or cleared by the [place] FDA and has been authorized for detection and/or diagnosis of SARS-CoV-2 by FDA under an Emergency Use Authorization (EUA). This EUA will remain in effect (meaning this test can be used) for the duration of the COVID-19 declaration under Section 564(b)(1) of the Act, 21 U.S.C. section 360bbb-3(b)(1), unless the authorization is terminated or revoked.  Performed at [facility], [place] [address] [place], [place] [address]      Time coordinating discharge: Over 30 minutes  SIGNED:   Little Ishikawa, DO [facility] 10/14/2021, 12:02 PM Pager   If 7PM-7AM, please contact night-coverage [email or web address]

## 2021-10-14 NOTE — TOC Initial Note (Addendum)
Transition of Care [facility]) - Initial/Assessment Note    Patient Details  Name: David Wyatt MRN: [id number] Date of Birth: Jan 06, 1961  Transition of Care [facility]) CM/SW Contact:    Carles Collet, RN Phone Number: 10/14/2021, 8:22 AM  Clinical Narrative:                 Patient follows at [facility], last visit 10/08/21.  Screened by [facility] LCSW 8/1, chart review shows that he is homeless and reports to [employer],  receives [employer], SSI and has Medicaid.  Patient positive for cocaine on admission, admits to using, and Medicaid is only payor source listed; therefore SNF placement quite unlikely.  Patient will need to ambulate as tolerates and possible PT eval and assistance while hospitalized to maximize baseline function.   RW ordered to be delivered to the room.  12:20 Spoke w patient at bedside to discuss transportation needs for potential DC. He states he will not go back to [employer] and if he has to go back there he will kill himself. Both the bedside RN and the attending Dr Avon Gully have been immediately notified of these statements. Patient resting calmly in bed at this time, handed off to nurse.  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)                                      Prior Living Arrangements/Services Living arrangements for the past 2 months: Homeless Naval architect) Lives with:: Self                   Activities of Daily Living Home Assistive Devices/Equipment: Environmental consultant (specify type) ADL Screening (condition at time of admission) Patient's cognitive ability adequate to safely complete daily activities?: No Is the patient deaf  or have difficulty hearing?: No Does the patient have difficulty seeing, even when wearing glasses/contacts?: No Does the patient have difficulty concentrating, remembering, or making decisions?: No Patient able to express need for assistance with ADLs?: Yes Does the patient have difficulty dressing or bathing?: Yes Independently performs ADLs?: No Communication: Independent Dressing (OT): Independent Grooming: Independent Feeding: Independent Bathing: Needs assistance Is this a change from baseline?: Pre-admission baseline (Per patient) Toileting: Needs assistance Is this a change from baseline?: Pre-admission baseline In/Out Bed: Appropriate for developmental age 51 in Home: Appropriate for developmental age Does the patient have difficulty walking or climbing stairs?: Yes Weakness of Legs: Both Weakness of Arms/Hands: Right  Permission Sought/Granted                  Emotional Assessment              Admission diagnosis:  Acute respiratory failure with hypoxia ([place]) [J96.01] Community acquired pneumonia, unspecified laterality [J18.9] Patient Active Problem List   Diagnosis Date Noted   Acute respiratory failure with hypoxia ([place]) 10/13/2021   History of cocaine use 07/23/2021   Chronic right shoulder pain 07/23/2021   Renal stones 07/23/2021   Dermoid cyst of skin of back 07/23/2021   Uncontrolled type 2 diabetes mellitus with hyperglycemia ([place]) 03/22/2021   Left kidney mass 03/22/2021  Hydronephrosis of right kidney 02/23/2021   Tobacco abuse 08/19/2018   Hyperlipidemia 04/24/2017   GERD (gastroesophageal reflux disease) 06/13/2015   Diabetic neuropathy ([place]) 05/01/2015   Essential hypertension, benign 11/01/2013   Lumbar disc herniation 09/13/2013   Unspecified constipation 09/13/2013   Chronic back pain 09/13/2013   Felon of finger of left hand with lymphangitis 12/13/2012   Crack cocaine use 07/29/2012   Arthritis 07/29/2012   Weakness  generalized 07/29/2012   PCP:  [name], MD Pharmacy:   [facility]. [address], [place] [address] Phone: [phone] Fax: [phone]  [facility] [place], [place] [address] Phone: [phone] Fax: [phone]  [facility] [address] [place] [address] Phone: [phone] Fax: [phone]     Social Determinants of Health (SDOH) Interventions    Readmission Risk Interventions     No data to display

## 2021-10-15 LAB — GLUCOSE, CAPILLARY
Glucose-Capillary: 194 mg/dL — ABNORMAL HIGH (ref 70–99)
Glucose-Capillary: 161 mg/dL — ABNORMAL HIGH (ref 70–99)
Glucose-Capillary: 312 mg/dL — ABNORMAL HIGH (ref 70–99)

## 2021-10-15 LAB — LEGIONELLA PNEUMOPHILA SEROGP 1 UR AG: L. pneumophila Serogp 1 Ur Ag: NEGATIVE

## 2021-10-15 MED ORDER — INSULIN GLARGINE-YFGN 100 UNIT/ML ~~LOC~~ SOLN
24.0000 [IU] | Freq: Every day | SUBCUTANEOUS | Status: DC
  Filled 2021-10-15: qty 0.24

## 2021-10-15 MED ORDER — INSULIN GLARGINE-YFGN 100 UNIT/ML ~~LOC~~ SOLN
14.0000 [IU] | Freq: Once | SUBCUTANEOUS | Status: AC
  Administered 2021-10-15: 14 [IU] via SUBCUTANEOUS
  Filled 2021-10-15: qty 0.14

## 2021-10-15 NOTE — Consult Note (Signed)
[facility] Consult   Reason for Consult:  Suicidal ideations Referring Physician:  Dr. Avon Gully Patient Identification: Calib Wadhwa MRN:  [id number] Principal Diagnosis: Acute respiratory failure with hypoxia [facility]) Diagnosis:  Principal Problem:   Acute respiratory failure with hypoxia ([place]) Active Problems:   Crack cocaine use   Essential hypertension, benign   Hyperlipidemia   Tobacco abuse   Uncontrolled type 2 diabetes mellitus with hyperglycemia ([place])   Total Time spent with patient: 1 hour  Subjective:   Neilan Rizzo is a 61 y.o. male patient admitted with acute respiratory failure with hypoxia.  Psych consult placed for suicidal ideations.  Patient admits to making a suicidal statement of " I am going to kill myself if I have to go back there."  He denies any true intent to self-harm when making that statement at discharge, and or upon discharge.  Patient reports he has been dealing with homeless majority of this year, and needs assistance with housing.  He is adamant about [facility].  He ruminates about his housing situation to include living in the woods, sleeping in his [place], and shelters.  Patient reports being open to assistance from hospital to assist with housing and relocation.  Patient states " I just need someone to do it for me." ;  He states due to his medical limitations which include chronic pain, respiratory, hernias have limited his ability to complete his paperwork and requests assistance.  This nurse practitioner did review services offered in the area, however patient will need to initiate accountability if he wishes to be successful.  He does verbalize understanding, and agrees that he will need to complete paperwork independently of others if he wants to pursue additional housing options.  During evaluation Saheed Carrington is sitting in no acute distress.  He is alert/oriented x 4; calm/cooperative; and mood congruent with affect.   He is speaking in a clear tone at moderate volume, and normal pace; with good eye contact.  His thought process is coherent and relevant; There is no indication that he is currently responding to internal/external stimuli or experiencing delusional thought content; and he has denied suicidal/self-harm/homicidal ideation, psychosis, and paranoia.  Patient has remained calm throughout assessment and has answered questions appropriately.    Derrious was seen and evaluated where he is observed to be lying in bed.  After introducing self and reason for visit, patient blatantly states " I just said that.  I am not a person to try to harm myself.  I just cannot go back there."  Patient denies any acute psychiatric concerns to include depression, anxiety, trauma, psychosis, paranoia, and or mood lability.  He further denies any history of suicide attempt, suicidal ideation, and or non suicidal self-injurious behavior.  He denies any current outpatient psychiatric providers, services, and or resources.  He denies any history of inpatient psychiatric admission.  Chart review does show patient presented to [facility] April 2023, with chief complaint of" thoughts of ending it all.";  During that admission he was determined to have made suicidal statements for secondary gain for housing and food.  He was subsequently [facility] for additional outpatient resources.  At this time Mr. Tino is educated and reminded of additional mental health resources and other crisis services available in the community.  He denies any additional services at this time from a psychiatric standpoint.  Patient does not meet inpatient psychiatric criteria.  Will psychiatrically clear at this time,  and discharged with outpatient resources as noted above.  The patient's statement that he will attempt suicide if discharged appears to be an expression of unmet needs (housing, pain  management) that is representative of limited and often-maladaptive coping and skills, rather than an indicator of imminent risk of death. The patient is unwilling to participate in any interventions. Patient's treatment goals of housing are unobtainable in the short-term inpatient setting. Any safety benefit of hospitalization is mitigated by patient's lack of collaboration, accountability, and engagement with the treatment team. Continued treatment in the hospital is delaying patient's engagement with outpatient services and the period during which patient must demonstrate outpatient stability to be eligible for housing.    HPI:  Derran Sear is a 61 y.o. male with medical history significant of chronic back pain, HLD, HTN, T2DM, hx of cocaine abuse who presented to [facility] with complaints of fatigue.  Patient was noted to be minimally hypoxic at intake initially placed on 4 L nasal cannula and able to wean to room air over the subsequent 12 hours.  Patient's admission was somewhat complicated given his mental status and inability to recall recent events.  His UDS was cocaine positive concerning for acute intoxication which appears to have resolved at this point he is otherwise back to baseline denies nausea vomiting diarrhea constipation headache fevers chills or chest pain.  He does have some weakness and mild dyspnea with exertion but is without overt hypoxia or respiratory distress, as such we will discharge patient with walker and remainder of antibiotics to treat his likely community-acquired pneumonia versus aspiration in the setting of intoxication.  He does have PCP locally which we recommended that he follow-up in the next few days for complaints of chronic right shoulder pain.  Patient otherwise stable for discharge.  Past Psychiatric History: Polysubstance use, most recent cocaine use. Pt denies ever been hospitalized for mental health concerns in the past. Denies any previous history of suicidal  thoughts, suicidal ideations, and or non suicidal self injurious behaviors. Pt denies history of aggression, agitation, violent behavior, and or history of homicidal ideations/thoughts.  Patient further denies any current, previous legal charges.  Patient further denies access to guns, weapons, or any engagement with the legal system.      Risk to Self:  Denies Risk to Others:  Denies Prior Inpatient Therapy:   Denies Prior Outpatient Therapy:  Denies  Past Medical History:  Past Medical History:  Diagnosis Date   Angioedema of lips 07/28/2012   left upper (07/29/2012)   Arthritis    hands and back   Chronic back pain    Cocaine abuse ([place])    Diabetic neuropathy ([facility])    High cholesterol    Hypertension    Neuropathy    Type II diabetes mellitus ([place])     Past Surgical History:  Procedure Laterality Date   BACK SURGERY     CYSTOSCOPY W/ URETERAL STENT PLACEMENT Right 02/23/2021   Procedure: CYSTOSCOPY WITH RETROGRADE PYELOGRAM/URETERAL STENT PLACEMENT;  Surgeon: Janith Lima, MD;  Location: [facility];  Service: Urology;  Laterality: Right;   HERNIA REPAIR Right 04/01/2012   I & D EXTREMITY Left 12/13/2012   Procedure: IRRIGATION AND DEBRIDEMENT LEFT THUMB;  Surgeon: Tennis Must, MD;  Location: [facility];  Service: Orthopedics;  Laterality: Left;   INCISION AND DRAINAGE OF WOUND     boil on back/notes 07/14/2008 (07/29/2012)   INGUINAL HERNIA REPAIR  04/01/2012   Procedure: HERNIA REPAIR INGUINAL ADULT;  Surgeon: Darrick Meigs  Fanny Bien, MD;  Location: [facility];  Service: General;  Laterality: Right;   INGUINAL HERNIA REPAIR Left 09/06/2016   Procedure: OPEN REPAIR LEFT INGUINAL HERNIA;  Surgeon: Greer Pickerel, MD;  Location: [facility];  Service: General;  Laterality: Left;   INSERTION OF MESH Left 09/06/2016   Procedure: INSERTION OF MESH;  Surgeon: Greer Pickerel, MD;  Location: [facility];  Service: General;  Laterality: Left;   TONSILLECTOMY     Family History:  Family History  Problem Relation Age of  Onset   Diabetes Mother    Kidney disease Mother    Hyperlipidemia Mother    Hyperlipidemia Father    Diabetes Father    Family Psychiatric  History: Denies Social History:  Social History   Substance and Sexual Activity  Alcohol Use No   Alcohol/week: 0.0 standard drinks of alcohol     Social History   Substance and Sexual Activity  Drug Use Not Currently   Types: "Crack" cocaine    Social History   Socioeconomic History   Marital status: Divorced    Spouse name: Not on file   Number of children: 3   Years of education: 12   Highest education level: Not on file  Occupational History    Comment: disabled  Tobacco Use   Smoking status: Every Day    Packs/day: 0.25    Years: 30.00    Total pack years: 7.50    Types: Cigarettes   Smokeless tobacco: Former    Quit date: 08/15/2015   Tobacco comments:    04/29/16 2  cigs daily, 10/27/17 sometimes < .25 PPD  Vaping Use   Vaping Use: Never used  Substance and Sexual Activity   Alcohol use: No    Alcohol/week: 0.0 standard drinks of alcohol   Drug use: Not Currently    Types: "Crack" cocaine   Sexual activity: Not on file  Other Topics Concern   Not on file  Social History Narrative   04/29/17   Lives in shelter   Caffeine- a lot of  tea, coffee   Social Determinants of Health   Financial Resource Strain: Not on file  Food Insecurity: Not on file  Transportation Needs: Not on file  Physical Activity: Not on file  Stress: Not on file  Social Connections: Not on file   Additional Social History:    Allergies:   Allergies  Allergen Reactions   Lisinopril Other (See Comments)    Lip Swelling, angioedema    Labs:  Results for orders placed or performed during the hospital encounter of 10/13/21 (from the past 48 hour(s))  Glucose, capillary     Status: Abnormal   Collection Time: 10/13/21  5:12 PM  Result Value Ref Range   Glucose-Capillary 281 (H) 70 - 99 mg/dL    Comment: Glucose reference range applies  only to samples taken after fasting for at least 8 hours.  Glucose, capillary     Status: Abnormal   Collection Time: 10/13/21  9:44 PM  Result Value Ref Range   Glucose-Capillary 399 (H) 70 - 99 mg/dL    Comment: Glucose reference range applies only to samples taken after fasting for at least 8 hours.  Basic metabolic panel     Status: Abnormal   Collection Time: 10/14/21  5:52 AM  Result Value Ref Range   Sodium 138 135 - 145 mmol/L   Potassium 4.3 3.5 - 5.1 mmol/L   Chloride 106 98 - 111 mmol/L   CO2 25 22 -  32 mmol/L   Glucose, Bld 286 (H) 70 - 99 mg/dL    Comment: Glucose reference range applies only to samples taken after fasting for at least 8 hours.   BUN 14 8 - 23 mg/dL   Creatinine, Ser 0.98 0.61 - 1.24 mg/dL   Calcium 8.9 8.9 - 10.3 mg/dL   GFR, Estimated >60 >60 mL/min    Comment: (NOTE) Calculated using the CKD-EPI Creatinine Equation (2021)    Anion gap 7 5 - 15    Comment: Performed at [facility] [address] [place], [place] [address]  CBC     Status: Abnormal   Collection Time: 10/14/21  5:52 AM  Result Value Ref Range   WBC 9.6 4.0 - 10.5 K/uL   RBC 4.12 (L) 4.22 - 5.81 MIL/uL   Hemoglobin 12.6 (L) 13.0 - 17.0 g/dL   HCT 38.8 (L) 39.0 - 52.0 %   MCV 94.2 80.0 - 100.0 fL   MCH 30.6 26.0 - 34.0 pg   MCHC 32.5 30.0 - 36.0 g/dL   RDW 13.4 11.5 - 15.5 %   Platelets 556 (H) 150 - 400 K/uL   nRBC 0.0 0.0 - 0.2 %    Comment: Performed at [facility], [place] [address] [place], [place] [address]  Glucose, capillary     Status: Abnormal   Collection Time: 10/14/21  7:59 AM  Result Value Ref Range   Glucose-Capillary 239 (H) 70 - 99 mg/dL    Comment: Glucose reference range applies only to samples taken after fasting for at least 8 hours.  Glucose, capillary     Status: Abnormal   Collection Time: 10/14/21  4:22 PM  Result Value Ref Range   Glucose-Capillary 345 (H) 70 - 99 mg/dL    Comment: Glucose reference range applies only to samples taken  after fasting for at least 8 hours.  Glucose, capillary     Status: Abnormal   Collection Time: 10/14/21  8:43 PM  Result Value Ref Range   Glucose-Capillary 383 (H) 70 - 99 mg/dL    Comment: Glucose reference range applies only to samples taken after fasting for at least 8 hours.  Glucose, capillary     Status: Abnormal   Collection Time: 10/15/21  6:52 AM  Result Value Ref Range   Glucose-Capillary 194 (H) 70 - 99 mg/dL    Comment: Glucose reference range applies only to samples taken after fasting for at least 8 hours.  Glucose, capillary     Status: Abnormal   Collection Time: 10/15/21 11:35 AM  Result Value Ref Range   Glucose-Capillary 161 (H) 70 - 99 mg/dL    Comment: Glucose reference range applies only to samples taken after fasting for at least 8 hours.  Glucose, capillary     Status: Abnormal   Collection Time: 10/15/21  4:11 PM  Result Value Ref Range   Glucose-Capillary 312 (H) 70 - 99 mg/dL    Comment: Glucose reference range applies only to samples taken after fasting for at least 8 hours.    Current Facility-Administered Medications  Medication Dose Route Frequency Provider Last Rate Last Admin   acetaminophen (TYLENOL) tablet 650 mg  650 mg Oral Q6H PRN Orma Flaming, MD   650 mg at 10/14/21 1653   Or   acetaminophen (TYLENOL) suppository 650 mg  650 mg Rectal Q6H PRN Orma Flaming, MD       amoxicillin-clavulanate (AUGMENTIN) 875-125 MG per tablet 1 tablet  1 tablet Oral Q12H  Little Ishikawa, MD   1 tablet at 10/15/21 1054   atorvastatin (LIPITOR) tablet 40 mg  40 mg Oral Daily Orma Flaming, MD   40 mg at 10/15/21 1054   azithromycin (ZITHROMAX) tablet 250 mg  250 mg Oral Daily Orma Flaming, MD   250 mg at 10/15/21 1054   empagliflozin (JARDIANCE) tablet 10 mg  10 mg Oral QAC breakfast Orma Flaming, MD   10 mg at 10/15/21 0604   enoxaparin (LOVENOX) injection 40 mg  40 mg Subcutaneous Q24H Orma Flaming, MD   40 mg at 10/14/21 1630   insulin aspart  (novoLOG) injection 0-5 Units  0-5 Units Subcutaneous QHS Etta Quill, DO   5 Units at 10/14/21 2107   insulin aspart (novoLOG) injection 0-9 Units  0-9 Units Subcutaneous TID WC Etta Quill, DO   2 Units at 10/15/21 1208   insulin glargine-yfgn (SEMGLEE) injection 14 Units  14 Units Subcutaneous Once Little Ishikawa, MD       insulin glargine-yfgn [facility]) injection 24 Units  24 Units Subcutaneous Daily Little Ishikawa, MD        Musculoskeletal: Strength & Muscle Tone: within normal limits Gait & Station: normal Patient leans: N/A            Psychiatric Specialty Exam:  Presentation  General Appearance: Appropriate for Environment; Casual  Eye Contact:Fair  Speech:Clear and Coherent; Normal Rate  Speech Volume:Normal  Handedness:Right   Mood and Affect  Mood:Anxious; Depressed  Affect:Appropriate; Congruent   Thought Process  Thought Processes:Coherent; Linear  Descriptions of Associations:Intact  Orientation:Full (Time, Place and Person)  Thought Content:Logical  History of Schizophrenia/Schizoaffective disorder:No  Duration of Psychotic Symptoms:No data recorded Hallucinations:Hallucinations: None  Ideas of Reference:None  Suicidal Thoughts:Suicidal Thoughts: No  Homicidal Thoughts:Homicidal Thoughts: No   Sensorium  Memory:Immediate Good; Recent Good; Remote Good  Judgment:Good  Insight:Good   Executive Functions  Concentration:Good  Attention Span:Good  [place] of Knowledge:Good  Language:Good   Psychomotor Activity  Psychomotor Activity:Psychomotor Activity: Normal   Assets  Assets:Social Support; Resilience; Communication Skills; Desire for Improvement   Sleep  Sleep:Sleep: Fair   Physical Exam: Physical Exam ROS Blood pressure 137/89, pulse 72, temperature 98.6 F (37 C), temperature source Oral, resp. rate 16, height '5\' 8"'$  (1.727 m), weight 77.1 kg, SpO2 96 %. Body mass index is  25.85 kg/m.   Harold Moncus is a 61 y.o. history significant for acute respiratory failure, polysubstance abuse who was admitted for hypoxia.  After close observation from hospitalist team, patient began to endorse suicidal ideation if discharged to [employer].   Patient denied active SI yesterday and at the mention of discharge by his primary team he suddenly endorsed active SI w a plan but on reassessment by Psychiatry later in the AM patient denied SI. Patient reported that his SI is contingent on him being in the hospital and has already mentioned that his homelessness is a large contribution to his stress. Provider has also noticed that patient's affect appears more positive now and patient speak more on assessment and is more interactive. Patient is determined to be stable for discharge as he appears to be  reporting SI to his primary team which is in charge of his dispo, but denying the same SI w/ psychiatry.  Patient was able to identify that his lack of involvement and accountability for housing, were not helping his current situation and endorsed that he would try to be more motivated and assist with his own  housing.     Treatment Plan Summary: Plan Psych cleared.  -TOC consult for additional housing resources and assistance with housing paperwork if available.  -Patient declined any additional outpatient psych resources for substance use(cocaine).  -DC 1:1 safety sitter Disposition: No evidence of imminent risk to self or others at present.   Patient does not meet criteria for psychiatric inpatient admission. Supportive therapy provided about ongoing stressors. Discussed crisis plan, support from social network, calling 911, coming to the Emergency Department, and calling Suicide Hotline.  Suella Broad, FNP 10/15/2021 4:44 PM

## 2021-10-15 NOTE — Progress Notes (Signed)
  Mobility Specialist Criteria Algorithm Info.    10/15/21 1430  Mobility  Activity Refused mobility   Patient in bed asleep on arrival. Deferred hallway ambulation for unspecified reasons.  10/15/2021 02:30 PM  David Wyatt, [place], [place]  PBHEB:[phone] Office: [phone]

## 2021-10-15 NOTE — Discharge Summary (Signed)
Physician Discharge Summary  David Wyatt QQP:[id number] DOB: May 23, 1960 DOA: 10/13/2021  PCP: [name], MD  Admit date: 10/13/2021 Discharge date: 10/15/2021  Admitted From: Home Disposition: Home  Recommendations for Outpatient Follow-up:  Follow up with PCP in 1-2 weeks  Equipment/Devices: Walker  Discharge Condition: Stable CODE STATUS: Full Diet recommendation: As tolerated  Brief/Interim Summary: David Wyatt is a 61 y.o. male with medical history significant of chronic back pain, HLD, HTN, T2DM, hx of cocaine abuse who presented to [facility] with complaints of fatigue.  Patient was noted to be minimally hypoxic at intake initially placed on 4 L nasal cannula and able to wean to room air over the subsequent 12 hours.  Patient's admission was somewhat complicated given his mental status and inability to recall recent events.  His UDS was cocaine positive concerning for acute intoxication which appears to have resolved at this point he is otherwise back to baseline denies nausea vomiting diarrhea constipation headache fevers chills or chest pain.  He does have some weakness and mild dyspnea with exertion but is without overt hypoxia or respiratory distress, as such we will discharge patient with walker and remainder of antibiotics to treat his likely community-acquired pneumonia versus aspiration in the setting of intoxication.  He does have PCP locally which we recommended that he follow-up in the next few days for complaints of chronic right shoulder pain.  Patient otherwise stable for discharge.  Patient medically stable for discharge, awaiting for psych evaluation to ensure safe disposition   Discharge Diagnoses:  Principal Problem:   Acute respiratory failure with hypoxia ([place]) Active Problems:   Uncontrolled type 2 diabetes mellitus with hyperglycemia ([place])   Essential hypertension, benign   Hyperlipidemia   Crack cocaine use   Tobacco abuse  Discharge  Instructions   Allergies as of 10/15/2021       Reactions   Lisinopril Other (See Comments)   Lip Swelling, angioedema        Medication List     STOP taking these medications    carbamide peroxide 6.5 % OTIC solution Commonly known as: DEBROX   cyclobenzaprine 10 MG tablet Commonly known as: FLEXERIL   ibuprofen 600 MG tablet Commonly known as: ADVIL       TAKE these medications    Accu-Chek Guide test strip Generic drug: glucose blood Use up to 3 times daily as directed   Accu-Chek Guide w/Device Kit USE AS DIRECTED TO CHECK BLOOD SUGARS UP TO TWICE PER DAY   Accu-Chek Softclix Lancets lancets Use up to 4 times daily as directed What changed: Another medication with the same name was removed. Continue taking this medication, and follow the directions you see here.   amLODipine 10 MG tablet Commonly known as: NORVASC Take 1 tablet (10 mg total) by mouth daily.   amoxicillin-clavulanate 875-125 MG tablet Commonly known as: AUGMENTIN Take 1 tablet by mouth every 12 (twelve) hours for 4 days.   atorvastatin 40 MG tablet Commonly known as: LIPITOR Take 1 tablet (40 mg total) by mouth daily.   B-D ULTRAFINE III SHORT PEN 31G X 8 MM Misc Generic drug: Insulin Pen Needle Use with insulin pen   DULoxetine 60 MG capsule Commonly known as: Cymbalta Take 1 capsule (60 mg total) by mouth daily.   empagliflozin 10 MG Tabs tablet Commonly known as: Jardiance Take 1 tablet (10 mg total) by mouth daily before breakfast.   gabapentin 300 MG capsule Commonly known as: NEURONTIN Take 2 capsules (600 mg total) by mouth  3 (three) times daily.   glipiZIDE 10 MG 24 hr tablet Commonly known as: GLUCOTROL XL Take 1 tablet (10 mg total) by mouth daily with breakfast.   hydrOXYzine 25 MG tablet Commonly known as: ATARAX Take 1 tablet (25 mg total) by mouth at bedtime.   Insulin Glargine Solostar 100 UNIT/ML Solostar Pen Commonly known as: LANTUS Inject 24 Units  into the skin daily.   insulin lispro 100 UNIT/ML KwikPen Commonly known as: HUMALOG Inject 5 Units into the skin 2 (two) times daily before lunch and supper.   meloxicam 15 MG tablet Commonly known as: MOBIC Take 1 tablet (15 mg total) by mouth daily.   metFORMIN 1000 MG tablet Commonly known as: GLUCOPHAGE Take 1 tablet (1,000 mg total) by mouth 2 (two) times daily.   methocarbamol 500 MG tablet Commonly known as: ROBAXIN Take 1 tablet (500 mg total) by mouth 3 times/day as needed-between meals & bedtime for muscle spasms.   neomycin-polymyxin-hydrocortisone OTIC solution Commonly known as: CORTISPORIN Place 3 drops into both ears 4 (four) times daily.   tamsulosin 0.4 MG Caps capsule Commonly known as: FLOMAX Take 1 capsule (0.4 mg total) by mouth daily. For renal calculi               Durable Medical Equipment  (From admission, onward)           Start     Ordered   10/14/21 1152  For home use only DME Walker rolling  Once       Question Answer Comment  Walker: With 5 Inch Wheels   Patient needs a walker to treat with the following condition Weakness      10/14/21 1151            Allergies  Allergen Reactions   Lisinopril Other (See Comments)    Lip Swelling, angioedema    Consultations: None  Procedures/Studies: CT Angio Chest PE W and/or Wo Contrast  Result Date: 10/13/2021 CLINICAL DATA:  Chest pain, short of breath EXAM: CT ANGIOGRAPHY CHEST WITH CONTRAST TECHNIQUE: Multidetector CT imaging of the chest was performed using the standard protocol during bolus administration of intravenous contrast. Multiplanar CT image reconstructions and MIPs were obtained to evaluate the vascular anatomy. RADIATION DOSE REDUCTION: This exam was performed according to the departmental dose-optimization program which includes automated exposure control, adjustment of the mA and/or kV according to patient size and/or use of iterative reconstruction technique.  CONTRAST:  22m OMNIPAQUE IOHEXOL 350 MG/ML SOLN COMPARISON:  10/13/2021, 01/30/2012 FINDINGS: Cardiovascular: There is technically adequate opacification of the pulmonary vasculature. No filling defects or pulmonary emboli. The heart is unremarkable without pericardial effusion. No evidence of thoracic aortic aneurysm or dissection. Mediastinum/Nodes: No enlarged mediastinal, hilar, or axillary lymph nodes. Thyroid gland, trachea, and esophagus demonstrate no significant findings. Lungs/Pleura: Mild upper lobe predominant emphysematous change. Basilar predominant subpleural scarring has progressed since prior exam. No acute airspace disease, effusion, or pneumothorax. Central airways are patent. Upper Abdomen: No acute abnormality. Musculoskeletal: No acute or destructive bony lesions. Reconstructed images demonstrate no additional findings. Review of the MIP images confirms the above findings. IMPRESSION: 1. No acute intrathoracic describes that no evidence of pulmonary embolus. 2. Mild emphysema, with progressive bibasilar subpleural scarring. If further evaluation is desired, nonemergent outpatient high-resolution chest CT could be considered. 3. No acute intrathoracic process. Electronically Signed   By: MRanda NgoM.D.   On: 10/13/2021 18:31   DG CHEST PORT 1 VIEW  Result Date: 10/13/2021 CLINICAL DATA:  Shortness of breath on exertion.  Extreme weakness. EXAM: PORTABLE CHEST 1 VIEW COMPARISON:  Chest radiograph Jul 25, 2015 FINDINGS: Tortuosity and calcific atherosclerotic disease of the aorta. Cardiomediastinal silhouette is normal. Mediastinal contours appear intact. Bilateral patchy airspace opacities with lower lobe and central predominance. No pneumothorax or pleural effusion seen. Osseous structures are without acute abnormality. Soft tissues are grossly normal. IMPRESSION: Bilateral patchy airspace opacities with lower lobe and central predominance. This may represent atypical/viral pneumonia or  inflammatory interstitial lung disease. If no improvement with empiric treatment, recommend further evaluation with high-resolution chest CT. Electronically Signed   By: Fidela Salisbury M.D.   On: 10/13/2021 13:02   DG Shoulder Right  Result Date: 10/07/2021 CLINICAL DATA:  Acute RIGHT shoulder pain for 2 months. No known injury. EXAM: RIGHT SHOULDER - 3 VIEW COMPARISON:  07/20/2021 radiograph, 12/30/2020 CT and prior studies FINDINGS: There is no evidence of acute fracture, subluxation or dislocation. Mild degenerative changes at the [facility] joint again noted. No suspicious focal bony lesions are identified. IMPRESSION: No evidence of acute abnormality. Mild AC joint degenerative changes. If there is clinical suspicion for rotator cuff abnormality, recommend elective MRI. Electronically Signed   By: Margarette Canada M.D.   On: 10/07/2021 11:09   CT Head Wo Contrast  Result Date: 10/07/2021 CLINICAL DATA:  61 year old male presenting for evaluation of headache, new or worsening symptoms. EXAM: CT HEAD WITHOUT CONTRAST TECHNIQUE: Contiguous axial images were obtained from the base of the skull through the vertex without intravenous contrast. RADIATION DOSE REDUCTION: This exam was performed according to the departmental dose-optimization program which includes automated exposure control, adjustment of the mA and/or kV according to patient size and/or use of iterative reconstruction technique. COMPARISON:  12/30/2020. FINDINGS: Brain: No evidence of acute infarction, hemorrhage, hydrocephalus, extra-axial collection or mass lesion/mass effect. Signs of atrophy and patchy areas of hypoattenuation in deep white matter with similar distribution compared to previous imaging. Vascular: No hyperdense vessel or unexpected calcification. Skull: Normal. Negative for fracture or focal lesion. Sinuses/Orbits: Visualized paranasal sinuses and orbits without acute process. Other: None IMPRESSION: 1. No acute intracranial  abnormality. 2. Signs of atrophy and patchy areas of hypoattenuation in deep white matter with similar distribution compared to previous imaging, most commonly due to chronic microvascular ischemic changes. Electronically Signed   By: Zetta Bills M.D.   On: 10/07/2021 10:41     Subjective: No acute issues or events overnight denies nausea vomiting diarrhea constipation headache fevers chills shortness of breath or chest pain.  Complaint of right shoulder pain with range of motion, chronic.   Discharge Exam: Vitals:   10/14/21 2037 10/15/21 0747  BP: (!) 133/94 137/89  Pulse: 82 72  Resp: 16 16  Temp: 98.7 F (37.1 C) 98.6 F (37 C)  SpO2: 95% 96%   Vitals:   10/14/21 1100 10/14/21 1533 10/14/21 2037 10/15/21 0747  BP:  (!) 143/84 (!) 133/94 137/89  Pulse:  76 82 72  Resp:  _0 Temp:  99 F (37.2 C) 98.7 F (37.1 C) 98.6 F (37 C)  TempSrc:  Oral Oral Oral  SpO2: 97% 95% 95% 96%  Weight:      Height:        General: Pt is alert, awake, not in acute distress Cardiovascular: RRR, S1/S2 +, no rubs, no gallops Respiratory: CTA bilaterally, no wheezing, no rhonchi Abdominal: Soft, NT, ND, bowel sounds + Extremities: no edema, no cyanosis, right shoulder tender to palpate anteriorly at the  AC joint    The results of significant diagnostics from this hospitalization (including imaging, microbiology, ancillary and laboratory) are listed below for reference.     Microbiology: Recent Results (from the past 240 hour(s))  Urine Culture     Status: Abnormal   Collection Time: 10/07/21 12:54 PM   Specimen: Urine, Clean Catch  Result Value Ref Range Status   Specimen Description   Final    URINE, CLEAN CATCH Performed at [facility], [place] [address], [place], [place] [address]    Special Requests   Final    NONE Performed at [facility], [place] [address], [place], [place] [address]    Culture (A)  Final    40,000 COLONIES/mL  YEAST 20,000 COLONIES/mL LACTOBACILLUS SPECIES Standardized susceptibility testing for this organism is not available. Performed at [facility], [place] [address] [place], [place] [address]    Report Status 10/08/2021 FINAL  Final  Resp Panel by RT-PCR (Flu A&B, Covid) Anterior Nasal Swab     Status: None   Collection Time: 10/13/21 12:37 PM   Specimen: Anterior Nasal Swab  Result Value Ref Range Status   SARS Coronavirus 2 by RT PCR NEGATIVE NEGATIVE Final    Comment: (NOTE) SARS-CoV-2 target nucleic acids are NOT DETECTED.  The SARS-CoV-2 RNA is generally detectable in upper respiratory specimens during the acute phase of infection. The lowest concentration of SARS-CoV-2 viral copies this assay can detect is 138 copies/mL. A negative result does not preclude SARS-Cov-2 infection and should not be used as the sole basis for treatment or other patient management decisions. A negative result may occur with  improper specimen collection/handling, submission of specimen other than nasopharyngeal swab, presence of viral mutation(s) within the areas targeted by this assay, and inadequate number of viral copies(<138 copies/mL). A negative result must be combined with clinical observations, patient history, and epidemiological information. The expected result is Negative.  Fact Sheet for Patients:  EntrepreneurPulse.com.au  Fact Sheet for Healthcare Providers:  IncredibleEmployment.be  This test is no t yet approved or cleared by the [place] FDA and  has been authorized for detection and/or diagnosis of SARS-CoV-2 by FDA under an Emergency Use Authorization (EUA). This EUA will remain  in effect (meaning this test can be used) for the duration of the COVID-19 declaration under Section 564(b)(1) of the Act, 21 U.S.C.section 360bbb-3(b)(1), unless the authorization is terminated  or revoked sooner.       Influenza A by PCR NEGATIVE  NEGATIVE Final   Influenza B by PCR NEGATIVE NEGATIVE Final    Comment: (NOTE) The Xpert Xpress SARS-CoV-2/FLU/RSV plus assay is intended as an aid in the diagnosis of influenza from Nasopharyngeal swab specimens and should not be used as a sole basis for treatment. Nasal washings and aspirates are unacceptable for Xpert Xpress SARS-CoV-2/FLU/RSV testing.  Fact Sheet for Patients: EntrepreneurPulse.com.au  Fact Sheet for Healthcare Providers: IncredibleEmployment.be  This test is not yet approved or cleared by the [place] FDA and has been authorized for detection and/or diagnosis of SARS-CoV-2 by FDA under an Emergency Use Authorization (EUA). This EUA will remain in effect (meaning this test can be used) for the duration of the COVID-19 declaration under Section 564(b)(1) of the Act, 21 U.S.C. section 360bbb-3(b)(1), unless the authorization is terminated or revoked.  Performed at [facility], [place] [address], [place], [place] [address]   Respiratory (~20 pathogens) panel by PCR     Status: None  Collection Time: 10/13/21 12:37 PM   Specimen: Nasopharyngeal Swab; Respiratory  Result Value Ref Range Status   Adenovirus NOT DETECTED NOT DETECTED Final   Coronavirus 229E NOT DETECTED NOT DETECTED Final    Comment: (NOTE) The Coronavirus on the Respiratory Panel, DOES NOT test for the novel  Coronavirus (2019 nCoV)    Coronavirus HKU1 NOT DETECTED NOT DETECTED Final   Coronavirus NL63 NOT DETECTED NOT DETECTED Final   Coronavirus OC43 NOT DETECTED NOT DETECTED Final   Metapneumovirus NOT DETECTED NOT DETECTED Final   Rhinovirus / Enterovirus NOT DETECTED NOT DETECTED Final   Influenza A NOT DETECTED NOT DETECTED Final   Influenza B NOT DETECTED NOT DETECTED Final   Parainfluenza Virus 1 NOT DETECTED NOT DETECTED Final   Parainfluenza Virus 2 NOT DETECTED NOT DETECTED Final   Parainfluenza Virus 3 NOT DETECTED NOT DETECTED Final    Parainfluenza Virus 4 NOT DETECTED NOT DETECTED Final   Respiratory Syncytial Virus NOT DETECTED NOT DETECTED Final   Bordetella pertussis NOT DETECTED NOT DETECTED Final   Bordetella Parapertussis NOT DETECTED NOT DETECTED Final   Chlamydophila pneumoniae NOT DETECTED NOT DETECTED Final   Mycoplasma pneumoniae NOT DETECTED NOT DETECTED Final    Comment: Performed at [facility], [place]. [address] [place], [place] [address]     Labs: BNP (last 3 results) Recent Labs    10/13/21 1237  BNP 94.4   Basic Metabolic Panel: Recent Labs  Lab 10/13/21 1237 10/13/21 1314 10/14/21 0552  NA 136 137 138  K 4.3 4.3 4.3  CL 104  --  106  CO2 24  --  25  GLUCOSE 296*  --  286*  BUN 14  --  14  CREATININE 1.15  --  0.98  CALCIUM 9.0  --  8.9   Liver Function Tests: Recent Labs  Lab 10/13/21 1237  AST 13*  ALT 10  ALKPHOS 84  BILITOT 0.5  PROT 7.5  ALBUMIN 3.0*   No results for input(s): "LIPASE", "AMYLASE" in the last 168 hours. No results for input(s): "AMMONIA" in the last 168 hours. CBC: Recent Labs  Lab 10/13/21 1237 10/13/21 1314 10/14/21 0552  WBC 9.5  --  9.6  NEUTROABS 6.5  --   --   HGB 13.0 13.6 12.6*  HCT 40.4 40.0 38.8*  MCV 95.5  --  94.2  PLT 551*  --  556*   Cardiac Enzymes: No results for input(s): "CKTOTAL", "CKMB", "CKMBINDEX", "TROPONINI" in the last 168 hours. BNP: Invalid input(s): "POCBNP" CBG: Recent Labs  Lab 10/14/21 0759 10/14/21 1622 10/14/21 2043 10/15/21 0652 10/15/21 1135  GLUCAP 239* 345* 383* 194* 161*   D-Dimer No results for input(s): "DDIMER" in the last 72 hours. Hgb A1c No results for input(s): "HGBA1C" in the last 72 hours. Lipid Profile No results for input(s): "CHOL", "HDL", "LDLCALC", "TRIG", "CHOLHDL", "LDLDIRECT" in the last 72 hours. Thyroid function studies No results for input(s): "TSH", "T4TOTAL", "T3FREE", "THYROIDAB" in the last 72 hours.  Invalid input(s): "FREET3" Anemia work up No results for  input(s): "VITAMINB12", "FOLATE", "FERRITIN", "TIBC", "IRON", "RETICCTPCT" in the last 72 hours. Urinalysis    Component Value Date/Time   COLORURINE YELLOW 10/13/2021 1245   APPEARANCEUR CLOUDY (A) 10/13/2021 1245   LABSPEC 1.022 10/13/2021 1245   PHURINE 5.0 10/13/2021 1245   GLUCOSEU >=500 (A) 10/13/2021 1245   HGBUR LARGE (A) 10/13/2021 1245   BILIRUBINUR NEGATIVE 10/13/2021 1245   BILIRUBINUR negative 03/22/2021 1327   BILIRUBINUR small 02/16/2019 1618  KETONESUR NEGATIVE 10/13/2021 1245   PROTEINUR 30 (A) 10/13/2021 1245   UROBILINOGEN 0.2 03/22/2021 1327   UROBILINOGEN 1.0 10/12/2012 0149   NITRITE NEGATIVE 10/13/2021 1245   LEUKOCYTESUR LARGE (A) 10/13/2021 1245   Sepsis Labs Recent Labs  Lab 10/13/21 1237 10/14/21 0552  WBC 9.5 9.6   Microbiology Recent Results (from the past 240 hour(s))  Urine Culture     Status: Abnormal   Collection Time: 10/07/21 12:54 PM   Specimen: Urine, Clean Catch  Result Value Ref Range Status   Specimen Description   Final    URINE, CLEAN CATCH Performed at [facility], [place] [address], [place], [place] [address]    Special Requests   Final    NONE Performed at [facility], [place] [address], [place], [place] [address]    Culture (A)  Final    40,000 COLONIES/mL YEAST 20,000 COLONIES/mL LACTOBACILLUS SPECIES Standardized susceptibility testing for this organism is not available. Performed at [facility], [place] [address] [place], [place] [address]    Report Status 10/08/2021 FINAL  Final  Resp Panel by RT-PCR (Flu A&B, Covid) Anterior Nasal Swab     Status: None   Collection Time: 10/13/21 12:37 PM   Specimen: Anterior Nasal Swab  Result Value Ref Range Status   SARS Coronavirus 2 by RT PCR NEGATIVE NEGATIVE Final    Comment: (NOTE) SARS-CoV-2 target nucleic acids are NOT DETECTED.  The SARS-CoV-2 RNA is generally detectable in upper respiratory specimens during the acute phase of  infection. The lowest concentration of SARS-CoV-2 viral copies this assay can detect is 138 copies/mL. A negative result does not preclude SARS-Cov-2 infection and should not be used as the sole basis for treatment or other patient management decisions. A negative result may occur with  improper specimen collection/handling, submission of specimen other than nasopharyngeal swab, presence of viral mutation(s) within the areas targeted by this assay, and inadequate number of viral copies(<138 copies/mL). A negative result must be combined with clinical observations, patient history, and epidemiological information. The expected result is Negative.  Fact Sheet for Patients:  EntrepreneurPulse.com.au  Fact Sheet for Healthcare Providers:  IncredibleEmployment.be  This test is no t yet approved or cleared by the [place] FDA and  has been authorized for detection and/or diagnosis of SARS-CoV-2 by FDA under an Emergency Use Authorization (EUA). This EUA will remain  in effect (meaning this test can be used) for the duration of the COVID-19 declaration under Section 564(b)(1) of the Act, 21 U.S.C.section 360bbb-3(b)(1), unless the authorization is terminated  or revoked sooner.       Influenza A by PCR NEGATIVE NEGATIVE Final   Influenza B by PCR NEGATIVE NEGATIVE Final    Comment: (NOTE) The Xpert Xpress SARS-CoV-2/FLU/RSV plus assay is intended as an aid in the diagnosis of influenza from Nasopharyngeal swab specimens and should not be used as a sole basis for treatment. Nasal washings and aspirates are unacceptable for Xpert Xpress SARS-CoV-2/FLU/RSV testing.  Fact Sheet for Patients: EntrepreneurPulse.com.au  Fact Sheet for Healthcare Providers: IncredibleEmployment.be  This test is not yet approved or cleared by the [place] FDA and has been authorized for detection and/or diagnosis of SARS-CoV-2  by FDA under an Emergency Use Authorization (EUA). This EUA will remain in effect (meaning this test can be used) for the duration of the COVID-19 declaration under Section 564(b)(1) of the Act, 21 U.S.C. section 360bbb-3(b)(1), unless the authorization is terminated or revoked.  Performed at [facility], [place] [address] [place], [place] [address]   Respiratory (~20 pathogens) panel by PCR     Status: None   Collection Time: 10/13/21 12:37 PM   Specimen: Nasopharyngeal Swab; Respiratory  Result Value Ref Range Status   Adenovirus NOT DETECTED NOT DETECTED Final   Coronavirus 229E NOT DETECTED NOT DETECTED Final    Comment: (NOTE) The Coronavirus on the Respiratory Panel, DOES NOT test for the novel  Coronavirus (2019 nCoV)    Coronavirus HKU1 NOT DETECTED NOT DETECTED Final   Coronavirus NL63 NOT DETECTED NOT DETECTED Final   Coronavirus OC43 NOT DETECTED NOT DETECTED Final   Metapneumovirus NOT DETECTED NOT DETECTED Final   Rhinovirus / Enterovirus NOT DETECTED NOT DETECTED Final   Influenza A NOT DETECTED NOT DETECTED Final   Influenza B NOT DETECTED NOT DETECTED Final   Parainfluenza Virus 1 NOT DETECTED NOT DETECTED Final   Parainfluenza Virus 2 NOT DETECTED NOT DETECTED Final   Parainfluenza Virus 3 NOT DETECTED NOT DETECTED Final   Parainfluenza Virus 4 NOT DETECTED NOT DETECTED Final   Respiratory Syncytial Virus NOT DETECTED NOT DETECTED Final   Bordetella pertussis NOT DETECTED NOT DETECTED Final   Bordetella Parapertussis NOT DETECTED NOT DETECTED Final   Chlamydophila pneumoniae NOT DETECTED NOT DETECTED Final   Mycoplasma pneumoniae NOT DETECTED NOT DETECTED Final    Comment: Performed at [facility], [place]. [address] [place], [place] [address]     Time coordinating discharge: Over 30 minutes  SIGNED:   Little Ishikawa, DO [facility] 10/15/2021, 12:47 PM Pager   If 7PM-7AM, please contact night-coverage [email or web address]

## 2021-10-16 ENCOUNTER — Telehealth: Payer: Self-pay

## 2021-10-16 ENCOUNTER — Ambulatory Visit: Payer: Self-pay

## 2021-10-16 NOTE — Telephone Encounter (Signed)
[phone]  pt as called back again, he says he is not hearing the phone but I told him not to cb anymore this evening and turn phone on high, I think maybe he is on the phone calling back again and missing the calls.

## 2021-10-16 NOTE — Telephone Encounter (Signed)
Spoke with pt. Declined NT assistance. He states he just dc'd from hospital last night and Bonnita Nasuti is suppose to be coming tomorrow to assistance him with medications and he will just wait for her. I advised him if he needs further assistance while she is there to just call back and we would be glad to help. Pt verbalized understanding.

## 2021-10-16 NOTE — Telephone Encounter (Signed)
Pt returned call, waiting for cb

## 2021-10-16 NOTE — Telephone Encounter (Signed)
Pt called, unable to LVM d/t VM full, will try again later.   Summary: Med questions   [phone] Pt is requesting a call back from the nurses, pt needs advice on what medicine to take because he has not taken any of his Rxs since he left the hospital

## 2021-10-16 NOTE — Telephone Encounter (Signed)
I spoke to the patient since receiving this message.  The information is documented in a TOC call today

## 2021-10-16 NOTE — Telephone Encounter (Signed)
2nd attempt, pt called, unable to LVM d/t VM full.

## 2021-10-16 NOTE — Telephone Encounter (Signed)
Transition Care Management Follow-up Telephone Call Date of discharge and from where: 10/15/2021, [facility] How have you been since you were released from the hospital? He said he is at [facility] and does not want to be there.  He said he is feeling all right but adamant that he needs to get out of the shelter. He said he has not taken any of his medications, he doesn't know where to start.  He has all of the medications and AVS but is confused.  He said he is not going to take anything tonight and will start taking the medications in the morning. He plans to see Bonnita Nasuti, RN and Dr Joya Gaskins  tomorrow at [facility] and he will ask them to review the medications with him.  He said he is working with the case Freight forwarder at [place] to help get him out of the shelter.  He explained that he told the hospital staff that he would kill himself if he went back to [facility]. He does not have any suicidal ideation at this time he just states that he wants to get out of there and find a place to live. He was confident that the caseworker at [facility] is helping him secure housing.  I strongly encouraged him to consider ALF. I understand that he does not want to relinquish his monthly SSA check to the facility but I explained to him that money is for his care. He needs the assistance with med management and could benefit from  the assistance provided at the facility. I also explained to him that it does not mean he has to stay there forever but he should strongly consider that option now. He agreed that he needs the help and will think about ALF.  Any questions or concerns?         Yes- noted above Items Reviewed: Did the pt receive and understand the discharge instructions provided?  He has them but has not reviewed them Medications obtained and verified?  He said he has all of the medications but is overwhelmed and has not taken anything.  Other? No  Any new allergies since your discharge? No  Dietary  orders reviewed? No Do you have support at home?  Currently staying at the [place] and Equipment/Supplies: Were home health services ordered? no If so, what is the name of the agency? N/a  Has the agency set up a time to come to the patient's home? not applicable Were any new equipment or medical supplies ordered?  No What is the name of the medical supply agency? N/a Were you able to get the supplies/equipment? not applicable Do you have any questions related to the use of the equipment or supplies? No  Functional Questionnaire: (I = Independent and D = Dependent) ADLs: independent with personal care, needs assistance with med management. Has walker to use with ambulation but said he would like a rollator so he can sit down.    Follow up appointments reviewed:  PCP Hospital f/u appt confirmed?  Needs to be scheduled   patient plans to see Dr Joya Gaskins tomorrow - 10/18/2030 at [facility].  [facility] f/u appt confirmed?  None scheduled    Are transportation arrangements needed? Yes  If their condition worsens, is the pt aware to call PCP or go to the Emergency Dept.? Yes Was the patient provided with contact information for the PCP's office or ED? Yes Was to pt encouraged to call back with  questions or concerns? Yes

## 2021-10-16 NOTE — Telephone Encounter (Signed)
Transition Care Management Unsuccessful Follow-up Telephone Call  Date of discharge and from where:  [facility]15/[facility]  Attempts:  1st Attempt  Reason for unsuccessful TCM follow-up call:  Voice mail full # [phone].

## 2021-10-16 NOTE — Telephone Encounter (Signed)
Call returned.

## 2021-10-16 NOTE — Telephone Encounter (Signed)
Patient returned call and would like a follow up call

## 2021-10-17 LAB — GLUCOSE, CAPILLARY: Glucose-Capillary: 279 mg/dL — ABNORMAL HIGH (ref 70–99)

## 2021-10-17 NOTE — Telephone Encounter (Signed)
Pt called asking when Rosemarie Beath is coming today. Reassured pt that Pamala Duffel time because its the start of a work day.

## 2021-10-18 NOTE — Telephone Encounter (Signed)
The patient left the shelter in his [facility] yesterday afternoon around 3pm  We had started our clinic at 2p but he was not around.  Apparently earlier he had called EMS himself on his phone.  When they arrived he refused transport.  He exhibits a lot of attention seeking behaviors.  Unfortunately he did not return at the close of our clinic at [facility]: perhaps you can call him today?  We have emphasized before it is best to have one provider to avoid confusion and duplication of services.  VS were obtained earlier by helen  His CBGs BP and O2 Sats were normal  Asencion Noble

## 2021-10-19 ENCOUNTER — Telehealth: Payer: Self-pay | Admitting: Emergency Medicine

## 2021-10-19 NOTE — Telephone Encounter (Signed)
Spoke with Rep from [employer]. They will fax over the CMN for Dr.Newlin to sign. Patient has received his walker.

## 2021-10-19 NOTE — Telephone Encounter (Signed)
Copied from [place] [phone]. Topic: General - Other >> Oct 19, 2021  9:11 AM Everette C wrote: Reason for CRM: Roderic Palau with [employer] has called to check on the status of an [place] DME form for a walker submitted on 10/16/21  Please contact further when possible

## 2021-10-24 ENCOUNTER — Encounter: Payer: Self-pay | Admitting: Critical Care Medicine

## 2021-10-25 ENCOUNTER — Other Ambulatory Visit (HOSPITAL_COMMUNITY): Payer: Self-pay

## 2021-10-25 ENCOUNTER — Encounter: Payer: Self-pay | Admitting: *Deleted

## 2021-10-25 NOTE — Progress Notes (Signed)
This patient seen in the [place] clinic he had tried to call EMS within refused transport last week.  He said he was just agitated and anxious over his current condition and wants out of the shelter and housing.  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  Patient continues to complain of chronic low back pain and shoulder pain.  He would like a new rollator the current 1 has a broken wheel apparatus that cannot be repaired.  Patient recently was admitted for pneumonia he is finished his course of antibiotics he has no longer any shortness of breath or cough  On exam today blood pressure 139/90 saturation 93% room air blood sugar 143 pulse 106  Patient appears to be compliant with all medications.  We will obtain for the patient a new rollator  I contacted Dr. Smitty Pluck nurse to see if we can find out the status of his urology appointment  Patient has follow-up appointments with his primary care provider urged to continue to follow through on these  He has all his medications and we are providing him a pill organizer of the nurses filling this every week so that he can remain compliant with his medications  He did relapse with his cocaine use we offered him connection with our new social worker for drug treatment he declined at this time  The social worker has all his paperwork to apply for a housing voucher and this was turned down yesterday  Hopefully the patient will receive housing soon

## 2021-10-25 NOTE — Congregational Nurse Program (Signed)
Checked CBG 235, pt did not take midday insulin also not eating appropriately.  Went over correct med dosing Filled pillbox Made appt w/ urology wed aug 31 at 42, will send to case worker

## 2021-10-25 NOTE — Progress Notes (Signed)
noted 

## 2021-11-01 ENCOUNTER — Encounter: Payer: Self-pay | Admitting: *Deleted

## 2021-11-01 DIAGNOSIS — E1165 Type 2 diabetes mellitus with hyperglycemia: Secondary | ICD-10-CM

## 2021-11-09 ENCOUNTER — Other Ambulatory Visit: Payer: Self-pay

## 2021-11-09 ENCOUNTER — Other Ambulatory Visit: Payer: Self-pay | Admitting: *Deleted

## 2021-11-09 DIAGNOSIS — E1165 Type 2 diabetes mellitus with hyperglycemia: Secondary | ICD-10-CM

## 2021-11-09 DIAGNOSIS — G4709 Other insomnia: Secondary | ICD-10-CM

## 2021-11-09 MED ORDER — METFORMIN HCL 1000 MG PO TABS
1000.0000 mg | ORAL_TABLET | Freq: Two times a day (BID) | ORAL | 6 refills | Status: DC
  Filled 2021-11-09: qty 60, 30d supply, fill #0

## 2021-11-09 MED ORDER — HYDROXYZINE HCL 25 MG PO TABS
25.0000 mg | ORAL_TABLET | Freq: Every day | ORAL | 2 refills | Status: DC
  Filled 2021-11-09: qty 30, 30d supply, fill #0

## 2021-11-11 ENCOUNTER — Emergency Department (HOSPITAL_COMMUNITY)
Admission: EM | Admit: 2021-11-11 | Discharge: 2021-11-11 | Disposition: A | Discharge status: Home / Self Care | Payer: Medicaid Other | Attending: Emergency Medicine | Admitting: Emergency Medicine

## 2021-11-11 ENCOUNTER — Other Ambulatory Visit: Payer: Self-pay

## 2021-11-11 ENCOUNTER — Encounter (HOSPITAL_COMMUNITY): Payer: Self-pay | Admitting: Emergency Medicine

## 2021-11-11 DIAGNOSIS — Z794 Long term (current) use of insulin: Secondary | ICD-10-CM | POA: Diagnosis not present

## 2021-11-11 DIAGNOSIS — M5442 Lumbago with sciatica, left side: Secondary | ICD-10-CM | POA: Insufficient documentation

## 2021-11-11 DIAGNOSIS — L84 Corns and callosities: Secondary | ICD-10-CM | POA: Insufficient documentation

## 2021-11-11 DIAGNOSIS — M545 Low back pain, unspecified: Secondary | ICD-10-CM | POA: Diagnosis present

## 2021-11-11 DIAGNOSIS — G8929 Other chronic pain: Secondary | ICD-10-CM | POA: Diagnosis not present

## 2021-11-11 MED ORDER — IBUPROFEN 800 MG PO TABS
800.0000 mg | ORAL_TABLET | Freq: Once | ORAL | Status: AC
  Administered 2021-11-11: 800 mg via ORAL
  Filled 2021-11-11: qty 1

## 2021-11-11 MED ORDER — ACETAMINOPHEN 500 MG PO TABS
1000.0000 mg | ORAL_TABLET | Freq: Once | ORAL | Status: AC
  Administered 2021-11-11: 1000 mg via ORAL
  Filled 2021-11-11: qty 2

## 2021-11-11 NOTE — ED Provider Notes (Signed)
[facility] EMERGENCY DEPARTMENT Provider Note   CSN: [id number] Arrival date & time: 11/11/21  6734     History  Chief Complaint  Patient presents with   Back Pain    David Wyatt is a 61 y.o. male.  61 yo M with 2 complaints.  Tells me that he has pain on his foot he thinks that maybe he stepped on something.  He also has pain to his left low back that radiates down the leg.  He has had that pain off and on for some time but worsening over the past 3 to 4 days.  He thinks maybe he lift something heavy but is not sure.  Denies trauma denies loss of bowel or bladder denies loss of rectal sensation denies numbness or weakness to the legs.   Back Pain      Home Medications Prior to Admission medications   Medication Sig Start Date End Date Taking? Authorizing Provider  Accu-Chek Softclix Lancets lancets Use up to 4 times daily as directed 07/04/21   Barrett, Evelene Croon, PA-C  amLODipine (NORVASC) 10 MG tablet Take 1 tablet (10 mg total) by mouth daily. 10/04/21   Elsie Stain, MD  atorvastatin (LIPITOR) 40 MG tablet Take 1 tablet (40 mg total) by mouth daily. 10/04/21   Elsie Stain, MD  Blood Glucose Monitoring Suppl (ACCU-CHEK GUIDE) w/Device KIT USE AS DIRECTED TO CHECK BLOOD SUGARS UP TO TWICE PER DAY 07/05/21   [name], MD  DULoxetine (CYMBALTA) 60 MG capsule Take 1 capsule (60 mg total) by mouth daily. 10/04/21   Elsie Stain, MD  empagliflozin (JARDIANCE) 10 MG TABS tablet Take 1 tablet (10 mg total) by mouth daily before breakfast. 10/04/21   Elsie Stain, MD  gabapentin (NEURONTIN) 300 MG capsule Take 2 capsules (600 mg total) by mouth 3 (three) times daily. 10/04/21   Elsie Stain, MD  glipiZIDE (GLUCOTROL XL) 10 MG 24 hr tablet Take 1 tablet (10 mg total) by mouth daily with breakfast. 10/04/21   Elsie Stain, MD  glucose blood (ACCU-CHEK GUIDE) test strip Use up to 3 times daily as directed 09/20/21   Elsie Stain, MD   hydrOXYzine (ATARAX) 25 MG tablet Take 1 tablet (25 mg total) by mouth at bedtime. 11/09/21   Elsie Stain, MD  Insulin Glargine Solostar (LANTUS) 100 UNIT/ML Solostar Pen Inject 24 Units into the skin daily. 10/04/21   Elsie Stain, MD  insulin lispro (HUMALOG) 100 UNIT/ML KwikPen Inject 5 Units into the skin 2 (two) times daily before lunch and supper. 10/04/21   Elsie Stain, MD  Insulin Pen Needle 31G X 8 MM MISC Use with insulin pen 10/04/21   Elsie Stain, MD  meloxicam (MOBIC) 15 MG tablet Take 1 tablet (15 mg total) by mouth daily. 09/05/21   Mayers, Cari S, PA-C  metFORMIN (GLUCOPHAGE) 1000 MG tablet Take 1 tablet (1,000 mg total) by mouth 2 (two) times daily. 11/09/21   Elsie Stain, MD  methocarbamol (ROBAXIN) 500 MG tablet Take 1 tablet (500 mg total) by mouth 3 times/day as needed-between meals & bedtime for muscle spasms. 07/20/21   Harris, Abigail, PA-C  NEOMYCIN-POLYMYXIN-HYDROCORTISONE (CORTISPORIN) 1 % SOLN OTIC solution Place 3 drops into both ears 4 (four) times daily. 08/15/21   Elsie Stain, MD  tamsulosin (FLOMAX) 0.4 MG CAPS capsule Take 1 capsule (0.4 mg total) by mouth daily. For renal calculi 10/08/21   [name], MD  loratadine (CLARITIN) 10 MG tablet Take 1 tablet (10 mg total) by mouth daily. Patient not taking: No sig reported 06/03/17 07/20/20  [name], MD      Allergies    Lisinopril    Review of Systems   Review of Systems  Musculoskeletal:  Positive for back pain.    Physical Exam Updated Vital Signs BP 131/86 (BP Location: Right Arm)   Pulse (!) 106   Temp 98.4 F (36.9 C) (Oral)   Resp (!) 21   SpO2 95%  Physical Exam Vitals and nursing note reviewed.  Constitutional:      Appearance: He is well-developed.  HENT:     Head: Normocephalic and atraumatic.  Eyes:     Pupils: Pupils are equal, round, and reactive to light.  Neck:     Vascular: No JVD.  Cardiovascular:     Rate and Rhythm: Normal rate and regular  rhythm.     Heart sounds: No murmur heard.    No friction rub. No gallop.  Pulmonary:     Effort: No respiratory distress.     Breath sounds: No wheezing.  Abdominal:     General: There is no distension.     Tenderness: There is no abdominal tenderness. There is no guarding or rebound.  Musculoskeletal:        General: Normal range of motion.     Cervical back: Normal range of motion and neck supple.     Comments: Pulse motor and sensation intact to the left lower extremity.  Reflexes are 2+ and equal.  No clonus.  On the plantar aspect of the foot the patient has hypertrophy of the skin overlying the heel.  Area is tender.  No obvious foreign body.  Skin:    Coloration: Skin is not pale.     Findings: No rash.  Neurological:     Mental Status: He is alert and oriented to person, place, and time.  Psychiatric:        Behavior: Behavior normal.     ED Results / Procedures / Treatments   Labs (all labs ordered are listed, but only abnormal results are displayed) Labs Reviewed - No data to display  EKG None  Radiology No results found.  Procedures Procedures    Medications Ordered in ED Medications  acetaminophen (TYLENOL) tablet 1,000 mg (has no administration in time range)  ibuprofen (ADVIL) tablet 800 mg (has no administration in time range)    ED Course/ Medical Decision Making/ A&P                           Medical Decision Making Risk OTC drugs. Prescription drug management.   61 yo M with a chief complaints of left-sided low back pain that radiates to the leg and pain on the bottom of his foot when he tries to walk.  This has been going on for a couple days now.  Clinically the patient has a corn.  Discussed local therapy for the same.  Patient has a history of chronic low back pain.  Reviewed his most recent clinic note and had a surgery previously and unfortunately suffers from ongoing pain.  We will have him take Tylenol and ibuprofen or naproxen.   Have him follow-up with his PCP.  6:10 AM:  I have discussed the diagnosis/risks/treatment options with the patient.  Evaluation and diagnostic testing in the emergency department does not suggest an emergent condition requiring admission or immediate intervention  beyond what has been performed at this time.  They will follow up with  PCP. We also discussed returning to the ED immediately if new or worsening sx occur. We discussed the sx which are most concerning (e.g., sudden worsening pain, fever, inability to tolerate by mouth, cauda equina s/sx) that necessitate immediate return. Medications administered to the patient during their visit and any new prescriptions provided to the patient are listed below.  Medications given during this visit Medications  acetaminophen (TYLENOL) tablet 1,000 mg (has no administration in time range)  ibuprofen (ADVIL) tablet 800 mg (has no administration in time range)     The patient appears reasonably screen and/or stabilized for discharge and I doubt any other medical condition or other [facility] requiring further screening, evaluation, or treatment in the ED at this time prior to discharge.          Final Clinical Impression(s) / ED Diagnoses Final diagnoses:  Corn of foot  Chronic left-sided low back pain with left-sided sciatica    Rx / DC Orders ED Discharge Orders     None         Deno Etienne, DO 11/11/21 [phone]

## 2021-11-11 NOTE — ED Triage Notes (Signed)
Pt reported to ED with c/o lower back pain and sciatic nerve pain that radiates down left leg that has been ongoing x3 days.

## 2021-11-11 NOTE — Discharge Instructions (Addendum)
To treat corns and calluses, dermatologists recommend the following tips:  Soak the corn or callus in warm water. Do this for about five to 10 minutes or until the skin softens.  File the corn or callus with a pumice stone. First dip the pumice stone in warm water, and then use the stone to gently file the corn or callus. Use circular or sideways motions to remove dead skin.  Be careful not to take off too much skin. Doing so could cause bleeding and infection.  Apply moisturizing lotion or cream to the area daily. Look for a moisturizing lotion or cream with salicylic acid, ammonium lactate, or urea. These ingredients will help gradually soften hard corns and calluses.  Use padding. To protect calluses from further irritation during activity, cut a piece of moleskin - available at your local drugstore - into two half-moon shapes and place around the callus. To prevent a corn from making contact with your shoe, surround the corn with donut-shaped adhesive pads - also available at drugstores.  Wear shoes that properly fit. A common cause of corns is a shoe that isn't the right size and shape for your foot. To get the right fit, shop for shoes at the end of the day, when your feet may be slightly swollen. In addition, ask a clerk to measure your foot, and choose shoes that aren't too loose or tight.  Keep your toenails trimmed. Toenails that are too long can force the toes to push up against your shoe, causing a corn to form over time. To remove this pressure, keep your toenails trimmed.  Your back pain is most likely due to a muscular strain.  There is been a lot of research on back pain, unfortunately the only thing that seems to really help is Tylenol and ibuprofen.  Relative rest is also important to not lift greater than 10 pounds bending or twisting at the waist.  Please follow-up with your family physician.  The other thing that really seems to benefit patients is physical therapy which your  doctor may send you for.  Please return to the emergency department for new numbness or weakness to your arms or legs. Difficulty with urinating or urinating or pooping on yourself.  Also if you cannot feel toilet paper when you wipe or get a fever.   Take 4 over the counter ibuprofen tablets 3 times a day or 2 over-the-counter naproxen tablets twice a day for pain. Also take tylenol '1000mg'$ (2 extra strength) four times a day.

## 2021-11-16 ENCOUNTER — Other Ambulatory Visit: Payer: Self-pay

## 2021-11-27 ENCOUNTER — Other Ambulatory Visit: Payer: Self-pay

## 2021-11-27 LAB — GLUCOSE, POCT (MANUAL RESULT ENTRY): POC Glucose: 301 mg/dl — AB (ref 70–99)

## 2021-11-27 NOTE — Congregational Nurse Program (Signed)
  Dept: [phone]   Congregational Nurse Program Note  Date of Encounter: 11/01/2021  Clinic visit for blood pressure and blood glucose check, BP 128/76, pulse 80.  Blood glucose 301 one hour after lunch. Has not been taking insulin as prescribed, living out of van and not eating regular meals.  To return to clinic on 9/20 to see D. Joya Gaskins.  Past Medical History: Past Medical History:  Diagnosis Date   Angioedema of lips 07/28/2012   left upper (07/29/2012)   Arthritis    hands and back   Chronic back pain    Cocaine abuse (HCC)    Diabetic neuropathy (HCC)    High cholesterol    Hypertension    Neuropathy    Type II diabetes mellitus ([place])     Encounter Details:  CNP Questionnaire - 11/27/21 1240       Questionnaire   Do you give verbal consent to treat you today? Yes    Location Patient [place] or Organization    Patient Status Unknown    Insurance [facility]    Insurance Referral N/A    Medication N/A    Medical Provider Yes    Screening Referrals N/A    Medical Referral N/A    Medical Appointment Made N/A    Food Have Food Insecurities    Transportation N/A    Housing/Utilities N/A    Interpersonal Safety N/A    Intervention Blood pressure;Support;Counsel    ED Visit Averted N/A    Life-Saving Intervention Made N/A

## 2021-11-29 ENCOUNTER — Other Ambulatory Visit: Payer: Self-pay | Admitting: *Deleted

## 2021-11-29 ENCOUNTER — Other Ambulatory Visit: Payer: Self-pay

## 2021-11-29 ENCOUNTER — Encounter: Payer: Self-pay | Admitting: *Deleted

## 2021-11-29 DIAGNOSIS — N200 Calculus of kidney: Secondary | ICD-10-CM

## 2021-11-29 DIAGNOSIS — E1165 Type 2 diabetes mellitus with hyperglycemia: Secondary | ICD-10-CM

## 2021-11-29 MED ORDER — TAMSULOSIN HCL 0.4 MG PO CAPS
0.4000 mg | ORAL_CAPSULE | Freq: Every day | ORAL | 3 refills | Status: DC
  Filled 2021-11-29: qty 30, 30d supply, fill #0

## 2021-11-29 MED ORDER — INSULIN GLARGINE SOLOSTAR 100 UNIT/ML ~~LOC~~ SOPN
24.0000 [IU] | PEN_INJECTOR | Freq: Every day | SUBCUTANEOUS | 2 refills | Status: DC
Start: 2021-11-29 — End: 2022-01-23
  Filled 2021-11-29: qty 9, 37d supply, fill #0

## 2021-11-29 MED ORDER — INSULIN LISPRO (1 UNIT DIAL) 100 UNIT/ML (KWIKPEN)
5.0000 [IU] | PEN_INJECTOR | Freq: Two times a day (BID) | SUBCUTANEOUS | 6 refills | Status: DC
  Filled 2021-11-29: qty 3, 30d supply, fill #0

## 2021-11-29 MED ORDER — GLIPIZIDE ER 10 MG PO TB24
10.0000 mg | ORAL_TABLET | Freq: Every day | ORAL | 6 refills | Status: DC
  Filled 2021-11-29: qty 30, 30d supply, fill #0

## 2021-11-29 MED ORDER — METFORMIN HCL 1000 MG PO TABS
1000.0000 mg | ORAL_TABLET | Freq: Two times a day (BID) | ORAL | 6 refills | Status: DC
  Filled 2021-11-29: qty 60, 30d supply, fill #0

## 2021-11-29 MED ORDER — EMPAGLIFLOZIN 10 MG PO TABS
10.0000 mg | ORAL_TABLET | Freq: Every day | ORAL | 6 refills | Status: DC
  Filled 2021-11-29: qty 30, 30d supply, fill #0

## 2021-11-29 NOTE — Congregational Nurse Program (Signed)
CBG 327, 5 units humalog given. Encouraged h20 consumption and to stop eating sweets- cakes, cookies, candy, crackers.states he will try

## 2021-11-29 NOTE — Congregational Nurse Program (Signed)
Pt has been out of shelter for 5 days, he informs RN he has been using crack cocaine. No meds in 5 days.  Will fill pillbox and continue to counsel and monitor cbg and meds Cbg 317

## 2021-11-30 ENCOUNTER — Other Ambulatory Visit: Payer: Self-pay

## 2021-12-05 ENCOUNTER — Other Ambulatory Visit: Payer: Self-pay

## 2021-12-06 ENCOUNTER — Encounter: Payer: Self-pay | Admitting: *Deleted

## 2021-12-06 ENCOUNTER — Other Ambulatory Visit: Payer: Self-pay

## 2021-12-06 ENCOUNTER — Other Ambulatory Visit: Payer: Self-pay | Admitting: *Deleted

## 2021-12-06 DIAGNOSIS — E1165 Type 2 diabetes mellitus with hyperglycemia: Secondary | ICD-10-CM

## 2021-12-06 DIAGNOSIS — M5126 Other intervertebral disc displacement, lumbar region: Secondary | ICD-10-CM

## 2021-12-06 DIAGNOSIS — N200 Calculus of kidney: Secondary | ICD-10-CM

## 2021-12-06 MED ORDER — DULOXETINE HCL 60 MG PO CPEP
60.0000 mg | ORAL_CAPSULE | Freq: Every day | ORAL | 6 refills | Status: DC
  Filled 2021-12-06: qty 30, 30d supply, fill #0

## 2021-12-06 MED ORDER — TAMSULOSIN HCL 0.4 MG PO CAPS
0.4000 mg | ORAL_CAPSULE | Freq: Every day | ORAL | 3 refills | Status: DC
  Filled 2021-12-06: qty 30, 30d supply, fill #0

## 2021-12-06 MED ORDER — EMPAGLIFLOZIN 10 MG PO TABS
10.0000 mg | ORAL_TABLET | Freq: Every day | ORAL | 6 refills | Status: DC
  Filled 2021-12-06: qty 30, 30d supply, fill #0

## 2021-12-06 MED ORDER — METFORMIN HCL 1000 MG PO TABS
1000.0000 mg | ORAL_TABLET | Freq: Two times a day (BID) | ORAL | 6 refills | Status: DC
  Filled 2021-12-06: qty 60, 30d supply, fill #0

## 2021-12-06 NOTE — Congregational Nurse Program (Signed)
Pillbox filled, picked up insulin from pharmacy. Pt is nowhere to be found when his turn to see RN comes up.

## 2021-12-07 ENCOUNTER — Other Ambulatory Visit: Payer: Self-pay

## 2021-12-12 ENCOUNTER — Encounter: Payer: Self-pay | Admitting: Physician Assistant

## 2021-12-12 NOTE — Progress Notes (Signed)
Pt seen by Dr Joya Gaskins.  He is complaining that he has not seen Ortho, has appt 10/20. He is to get an injection in his shoulder at that visit. +crepitus on exam. Is not using Voltaren cream or Tylenol.  He is complaining about not having his ID.  He is having severe back and leg pain, as well as shoulder pain.  He is not aware of any hematuria, significant incontinence. Poor bladder control. + frequency. Dr Abner Greenspan is his urologist. We will make an appt.   He is not coughing.   He is not checking his blood sugars. Is taking his insulin and metformin. CBG is 148.   Bonnita Nasuti does a pill Environmental education officer for him. The compartments are inconsistently emptied. He is missing doses of his meds at times.   He is encouraged to take the gabapentin consistently.   He is using his walker, when he falls he cannot get up.    Rosaria Ferries, PA-C 12/12/2021 2:47 PM

## 2021-12-13 ENCOUNTER — Other Ambulatory Visit: Payer: Self-pay | Admitting: *Deleted

## 2021-12-13 DIAGNOSIS — E1165 Type 2 diabetes mellitus with hyperglycemia: Secondary | ICD-10-CM

## 2021-12-13 MED ORDER — GLIPIZIDE ER 10 MG PO TB24
10.0000 mg | ORAL_TABLET | Freq: Every day | ORAL | 6 refills | Status: DC
  Filled 2021-12-13: qty 30, 30d supply, fill #0

## 2021-12-14 ENCOUNTER — Other Ambulatory Visit: Payer: Self-pay

## 2021-12-14 ENCOUNTER — Encounter: Payer: Self-pay | Admitting: *Deleted

## 2021-12-14 NOTE — Congregational Nurse Program (Signed)
Dr Joya Gaskins saw [name] complete

## 2021-12-21 ENCOUNTER — Other Ambulatory Visit: Payer: Self-pay

## 2021-12-28 ENCOUNTER — Ambulatory Visit: Payer: Medicaid Other | Admitting: Orthopedic Surgery

## 2022-01-02 ENCOUNTER — Emergency Department (HOSPITAL_COMMUNITY)
Admission: EM | Admit: 2022-01-02 | Discharge: 2022-01-02 | Disposition: A | Discharge status: Home / Self Care | Payer: Medicaid Other | Attending: Emergency Medicine | Admitting: Emergency Medicine

## 2022-01-02 ENCOUNTER — Encounter (HOSPITAL_COMMUNITY): Payer: Self-pay | Admitting: Emergency Medicine

## 2022-01-02 DIAGNOSIS — Z7984 Long term (current) use of oral hypoglycemic drugs: Secondary | ICD-10-CM | POA: Diagnosis not present

## 2022-01-02 DIAGNOSIS — Z794 Long term (current) use of insulin: Secondary | ICD-10-CM | POA: Insufficient documentation

## 2022-01-02 DIAGNOSIS — E119 Type 2 diabetes mellitus without complications: Secondary | ICD-10-CM | POA: Diagnosis not present

## 2022-01-02 DIAGNOSIS — I10 Essential (primary) hypertension: Secondary | ICD-10-CM | POA: Diagnosis not present

## 2022-01-02 DIAGNOSIS — Z79899 Other long term (current) drug therapy: Secondary | ICD-10-CM | POA: Insufficient documentation

## 2022-01-02 DIAGNOSIS — Z5900 Homelessness unspecified: Secondary | ICD-10-CM | POA: Diagnosis present

## 2022-01-02 LAB — COMPREHENSIVE METABOLIC PANEL
ALT: 11 U/L (ref 0–44)
AST: 12 U/L — ABNORMAL LOW (ref 15–41)
Albumin: 3.4 g/dL — ABNORMAL LOW (ref 3.5–5.0)
Alkaline Phosphatase: 90 U/L (ref 38–126)
Anion gap: 9 (ref 5–15)
BUN: 11 mg/dL (ref 8–23)
CO2: 25 mmol/L (ref 22–32)
Calcium: 9.5 mg/dL (ref 8.9–10.3)
Chloride: 104 mmol/L (ref 98–111)
Creatinine, Ser: 0.98 mg/dL (ref 0.61–1.24)
GFR, Estimated: >60 mL/min (ref >60)
Glucose, Bld: 216 mg/dL — ABNORMAL HIGH (ref 70–99)
Potassium: 4.1 mmol/L (ref 3.5–5.1)
Sodium: 138 mmol/L (ref 135–145)
Total Bilirubin: 0.9 mg/dL (ref 0.3–1.2)
Total Protein: 7.4 g/dL (ref 6.5–8.1)

## 2022-01-02 LAB — CBC WITH DIFFERENTIAL/PLATELET
Abs Immature Granulocytes: 0.02 10*3/uL (ref 0.00–0.07)
Basophils Absolute: 0.1 10*3/uL (ref 0.0–0.1)
Basophils Relative: 1 %
Eosinophils Absolute: 0.3 10*3/uL (ref 0.0–0.5)
Eosinophils Relative: 4 %
HCT: 41.4 % (ref 39.0–52.0)
Hemoglobin: 13.9 g/dL (ref 13.0–17.0)
Immature Granulocytes: 0 %
Lymphocytes Relative: 28 %
Lymphs Abs: 1.9 10*3/uL (ref 0.7–4.0)
MCH: 30.6 pg (ref 26.0–34.0)
MCHC: 33.6 g/dL (ref 30.0–36.0)
MCV: 91.2 fL (ref 80.0–100.0)
Monocytes Absolute: 0.5 10*3/uL (ref 0.1–1.0)
Monocytes Relative: 8 %
Neutro Abs: 4.1 10*3/uL (ref 1.7–7.7)
Neutrophils Relative %: 59 %
Platelets: 369 10*3/uL (ref 150–400)
RBC: 4.54 MIL/uL (ref 4.22–5.81)
RDW: 14.1 % (ref 11.5–15.5)
WBC: 7 10*3/uL (ref 4.0–10.5)
nRBC: 0 % (ref 0.0–0.2)

## 2022-01-02 NOTE — Discharge Instructions (Signed)
You were seen today due to feeling weak due to possible hunger and food insecurity.  I have attached social services resources that she may contact for help with nutrition as needed.  I have also attached resources for shelters in the area.  Your lab work overall today was unremarkable.  Please follow-up with the resources provided or contact your primary care provider for help with chronic complaints and issues.

## 2022-01-02 NOTE — ED Provider Triage Note (Signed)
Emergency Medicine Provider Triage Evaluation Note  David Wyatt , a 61 y.o. male  was evaluated in triage.  Pt complains of generalized feeling of malaise.  Patient is homeless.  States that he has not taken his medications in a week due to feeling depressed.  He states he does have his medications in a van.  Patient has no specific complaints at this time.  He does endorse not having any medical care in some time.  Concerned about his diabetes  Review of Systems  Positive: As above Negative: As above  Physical Exam  BP (!) 136/98 (BP Location: Left Arm)   Pulse 72   Temp 97.6 F (36.4 C) (Oral)   Resp 16   SpO2 98%  Gen:   Awake, no distress   Resp:  Normal effort  MSK:   Moves extremities without difficulty  Other:    Medical Decision Making  Medically screening exam initiated at 11:18 AM.  Appropriate orders placed.  David Wyatt was informed that the remainder of the evaluation will be completed by another provider, this initial triage assessment does not replace that evaluation, and the importance of remaining in the ED until their evaluation is complete.     Dorothyann Peng, PA-C 01/02/22 1123

## 2022-01-02 NOTE — ED Triage Notes (Signed)
Pt here with c/o not feeling well , pt states that he is homeless living in his [employer] and not taking his meds for about 1 week

## 2022-01-02 NOTE — ED Provider Notes (Signed)
[facility] EMERGENCY DEPARTMENT Provider Note   CSN: [id number] Arrival date & time: 01/02/22  1028     History  Chief Complaint  Patient presents with   Homeless    David Wyatt is a 61 y.o. male.  Patient presents to the hospital with a chief complaint of difficulty with food access.  Patient states he has not had any medical care in a while would like his labs checked.  He states that he is homeless and has not had much to eat recently.  He endorses having medication in his Lucianne Lei but states he has not been taking him due to feeling somewhat depressed.  He denies any suicidal ideation, hallucinations, homicidal ideation.  Denies any specific complaints at this time other than hunger.  Past medical history significant for type 2 diabetes, hypertension, neuropathy, history of cocaine abuse  HPI     Home Medications Prior to Admission medications   Medication Sig Start Date End Date Taking? Authorizing Provider  Accu-Chek Softclix Lancets lancets Use up to 4 times daily as directed 07/04/21   Barrett, Evelene Croon, PA-C  amLODipine (NORVASC) 10 MG tablet Take 1 tablet (10 mg total) by mouth daily. 10/04/21   Elsie Stain, MD  atorvastatin (LIPITOR) 40 MG tablet Take 1 tablet (40 mg total) by mouth daily. 10/04/21   Elsie Stain, MD  Blood Glucose Monitoring Suppl (ACCU-CHEK GUIDE) w/Device KIT USE AS DIRECTED TO CHECK BLOOD SUGARS UP TO TWICE PER DAY 07/05/21   [name], MD  DULoxetine (CYMBALTA) 60 MG capsule Take 1 capsule (60 mg total) by mouth daily. 12/06/21   Elsie Stain, MD  empagliflozin (JARDIANCE) 10 MG TABS tablet Take 1 tablet (10 mg total) by mouth daily before breakfast. 12/06/21   Elsie Stain, MD  gabapentin (NEURONTIN) 300 MG capsule Take 2 capsules (600 mg total) by mouth 3 (three) times daily. 10/04/21   Elsie Stain, MD  glipiZIDE (GLUCOTROL XL) 10 MG 24 hr tablet Take 1 tablet (10 mg total) by mouth daily with breakfast.  12/13/21   Elsie Stain, MD  glucose blood (ACCU-CHEK GUIDE) test strip Use up to 3 times daily as directed 09/20/21   Elsie Stain, MD  hydrOXYzine (ATARAX) 25 MG tablet Take 1 tablet (25 mg total) by mouth at bedtime. 11/09/21   Elsie Stain, MD  Insulin Glargine Solostar (LANTUS) 100 UNIT/ML Solostar Pen Inject 24 Units into the skin daily. 11/29/21   Elsie Stain, MD  insulin lispro (HUMALOG) 100 UNIT/ML KwikPen Inject 5 Units into the skin 2 (two) times daily before lunch and supper. 11/29/21   Elsie Stain, MD  Insulin Pen Needle 31G X 8 MM MISC Use with insulin pen 10/04/21   Elsie Stain, MD  meloxicam (MOBIC) 15 MG tablet Take 1 tablet (15 mg total) by mouth daily. 09/05/21   Mayers, Cari S, PA-C  metFORMIN (GLUCOPHAGE) 1000 MG tablet Take 1 tablet (1,000 mg total) by mouth 2 (two) times daily. 12/06/21   Elsie Stain, MD  methocarbamol (ROBAXIN) 500 MG tablet Take 1 tablet (500 mg total) by mouth 3 times/day as needed-between meals & bedtime for muscle spasms. 07/20/21   Harris, Abigail, PA-C  NEOMYCIN-POLYMYXIN-HYDROCORTISONE (CORTISPORIN) 1 % SOLN OTIC solution Place 3 drops into both ears 4 (four) times daily. 08/15/21   Elsie Stain, MD  tamsulosin (FLOMAX) 0.4 MG CAPS capsule Take 1 capsule (0.4 mg total) by mouth daily. For renal calculi 12/06/21  Elsie Stain, MD  loratadine (CLARITIN) 10 MG tablet Take 1 tablet (10 mg total) by mouth daily. Patient not taking: No sig reported 06/03/17 07/20/20  [name], MD      Allergies    Lisinopril    Review of Systems   Review of Systems  Respiratory:  Negative for shortness of breath.   Cardiovascular:  Negative for chest pain.  Gastrointestinal:  Negative for abdominal pain, nausea and vomiting.    Physical Exam Updated Vital Signs BP (!) 136/98 (BP Location: Left Arm)   Pulse 72   Temp 97.6 F (36.4 C) (Oral)   Resp 16   SpO2 98%  Physical Exam Vitals and nursing note reviewed.   Constitutional:      General: He is not in acute distress.    Appearance: He is well-developed.  HENT:     Head: Normocephalic and atraumatic.     Mouth/Throat:     Mouth: Mucous membranes are moist.  Eyes:     Conjunctiva/sclera: Conjunctivae normal.  Cardiovascular:     Rate and Rhythm: Normal rate and regular rhythm.     Heart sounds: No murmur heard. Pulmonary:     Effort: Pulmonary effort is normal. No respiratory distress.     Breath sounds: Normal breath sounds.  Abdominal:     Palpations: Abdomen is soft.     Tenderness: There is no abdominal tenderness.  Musculoskeletal:        General: No swelling.     Cervical back: Neck supple.  Skin:    General: Skin is warm and dry.     Capillary Refill: Capillary refill takes less than 2 seconds.  Neurological:     Mental Status: He is alert.  Psychiatric:        Mood and Affect: Mood normal.     ED Results / Procedures / Treatments   Labs (all labs ordered are listed, but only abnormal results are displayed) Labs Reviewed  COMPREHENSIVE METABOLIC PANEL - Abnormal; Notable for the following components:      Result Value   Glucose, Bld 216 (*)    Albumin 3.4 (*)    AST 12 (*)    All other components within normal limits  CBC WITH DIFFERENTIAL/PLATELET    EKG None  Radiology No results found.  Procedures Procedures    Medications Ordered in ED Medications - No data to display  ED Course/ Medical Decision Making/ A&P                           Medical Decision Making Amount and/or Complexity of Data Reviewed Labs: ordered.   Patient presents to the hospital with chief complaint of hunger.  No other complaints at this time other than requesting his labs rechecked.  I reviewed the patient's past medical history including cardiology notes from earlier this month with complaints about chronic back, leg, and shoulder pain.  I ordered and reviewed basic labs.  Pertinent results include glucose 216, albumin  3.4 (improved over 2 months ago)  There is no indication at this time for imaging  I provided some food for the patient including a sandwich and applesauce.  I believe the patient is here today mostly due to hunger and due to his homelessness.  He has no specific medical complaints at this time.  Basic labs are unremarkable.  There is no indication for admission.  Plan to discharge patient home and have him continue follow-up with outpatient  providers as needed       Final Clinical Impression(s) / ED Diagnoses Final diagnoses:  Homelessness    Rx / DC Orders ED Discharge Orders     None         Ronny Bacon 01/02/22 1305    Pattricia Boss, MD 01/03/22 1723

## 2022-01-07 ENCOUNTER — Encounter (HOSPITAL_COMMUNITY): Payer: Self-pay | Admitting: *Deleted

## 2022-01-07 ENCOUNTER — Emergency Department (HOSPITAL_COMMUNITY)
Admission: EM | Admit: 2022-01-07 | Discharge: 2022-01-07 | Disposition: A | Discharge status: Home / Self Care | Payer: Medicaid Other | Attending: Emergency Medicine | Admitting: Emergency Medicine

## 2022-01-07 ENCOUNTER — Other Ambulatory Visit: Payer: Self-pay

## 2022-01-07 DIAGNOSIS — Z79899 Other long term (current) drug therapy: Secondary | ICD-10-CM | POA: Insufficient documentation

## 2022-01-07 DIAGNOSIS — Z7984 Long term (current) use of oral hypoglycemic drugs: Secondary | ICD-10-CM | POA: Insufficient documentation

## 2022-01-07 DIAGNOSIS — G8929 Other chronic pain: Secondary | ICD-10-CM | POA: Diagnosis not present

## 2022-01-07 DIAGNOSIS — Z5900 Homelessness unspecified: Secondary | ICD-10-CM

## 2022-01-07 DIAGNOSIS — E119 Type 2 diabetes mellitus without complications: Secondary | ICD-10-CM | POA: Insufficient documentation

## 2022-01-07 DIAGNOSIS — Z794 Long term (current) use of insulin: Secondary | ICD-10-CM | POA: Insufficient documentation

## 2022-01-07 DIAGNOSIS — J00 Acute nasopharyngitis [common cold]: Secondary | ICD-10-CM | POA: Diagnosis not present

## 2022-01-07 DIAGNOSIS — M545 Low back pain, unspecified: Secondary | ICD-10-CM | POA: Diagnosis not present

## 2022-01-07 LAB — CBG MONITORING, ED: Glucose-Capillary: 267 mg/dL — ABNORMAL HIGH (ref 70–99)

## 2022-01-07 MED ORDER — IBUPROFEN 800 MG PO TABS: 800.0000 mg | ORAL_TABLET | Freq: Three times a day (TID) | ORAL | 0 refills | Status: DC

## 2022-01-07 MED ORDER — IBUPROFEN 800 MG PO TABS
800.0000 mg | ORAL_TABLET | Freq: Once | ORAL | Status: AC
  Administered 2022-01-07: 800 mg via ORAL
  Filled 2022-01-07: qty 1

## 2022-01-07 MED ORDER — METHOCARBAMOL 500 MG PO TABS
500.0000 mg | ORAL_TABLET | Freq: Once | ORAL | Status: AC
  Administered 2022-01-07: 500 mg via ORAL
  Filled 2022-01-07: qty 1

## 2022-01-07 MED ORDER — METHOCARBAMOL 500 MG PO TABS: 500.0000 mg | ORAL_TABLET | Freq: Two times a day (BID) | ORAL | 0 refills | Status: DC | PRN

## 2022-01-07 NOTE — ED Provider Notes (Signed)
[place] EMERGENCY DEPARTMENT Provider Note   CSN: [id number] Arrival date & time: 01/07/22  0421     History  Chief Complaint  Patient presents with   Sciatica, cold, hungry    David Wyatt is a 61 y.o. male history of homelessness, diabetes, sciatica here presenting with back pain and feeling cold.  Patient came in at 4 AM this morning feeling cold and hungry.  Patient is homeless and on the street.  Patient has chronic back pain and has not been taking any medications.  Patient is feeling cold as well.  Patient wants a sandwich.  He states that he is homeless and sleeps at the gas station  The history is provided by the patient.       Home Medications Prior to Admission medications   Medication Sig Start Date End Date Taking? Authorizing Provider  Accu-Chek Softclix Lancets lancets Use up to 4 times daily as directed 07/04/21   Barrett, Evelene Croon, PA-C  amLODipine (NORVASC) 10 MG tablet Take 1 tablet (10 mg total) by mouth daily. 10/04/21   Elsie Stain, MD  atorvastatin (LIPITOR) 40 MG tablet Take 1 tablet (40 mg total) by mouth daily. 10/04/21   Elsie Stain, MD  Blood Glucose Monitoring Suppl (ACCU-CHEK GUIDE) w/Device KIT USE AS DIRECTED TO CHECK BLOOD SUGARS UP TO TWICE PER DAY 07/05/21   [name], MD  DULoxetine (CYMBALTA) 60 MG capsule Take 1 capsule (60 mg total) by mouth daily. 12/06/21   Elsie Stain, MD  empagliflozin (JARDIANCE) 10 MG TABS tablet Take 1 tablet (10 mg total) by mouth daily before breakfast. 12/06/21   Elsie Stain, MD  gabapentin (NEURONTIN) 300 MG capsule Take 2 capsules (600 mg total) by mouth 3 (three) times daily. 10/04/21   Elsie Stain, MD  glipiZIDE (GLUCOTROL XL) 10 MG 24 hr tablet Take 1 tablet (10 mg total) by mouth daily with breakfast. 12/13/21   Elsie Stain, MD  glucose blood (ACCU-CHEK GUIDE) test strip Use up to 3 times daily as directed 09/20/21   Elsie Stain, MD  hydrOXYzine  (ATARAX) 25 MG tablet Take 1 tablet (25 mg total) by mouth at bedtime. 11/09/21   Elsie Stain, MD  Insulin Glargine Solostar (LANTUS) 100 UNIT/ML Solostar Pen Inject 24 Units into the skin daily. 11/29/21   Elsie Stain, MD  insulin lispro (HUMALOG) 100 UNIT/ML KwikPen Inject 5 Units into the skin 2 (two) times daily before lunch and supper. 11/29/21   Elsie Stain, MD  Insulin Pen Needle 31G X 8 MM MISC Use with insulin pen 10/04/21   Elsie Stain, MD  meloxicam (MOBIC) 15 MG tablet Take 1 tablet (15 mg total) by mouth daily. 09/05/21   Mayers, Cari S, PA-C  metFORMIN (GLUCOPHAGE) 1000 MG tablet Take 1 tablet (1,000 mg total) by mouth 2 (two) times daily. 12/06/21   Elsie Stain, MD  methocarbamol (ROBAXIN) 500 MG tablet Take 1 tablet (500 mg total) by mouth 3 times/day as needed-between meals & bedtime for muscle spasms. 07/20/21   Harris, Abigail, PA-C  NEOMYCIN-POLYMYXIN-HYDROCORTISONE (CORTISPORIN) 1 % SOLN OTIC solution Place 3 drops into both ears 4 (four) times daily. 08/15/21   Elsie Stain, MD  tamsulosin (FLOMAX) 0.4 MG CAPS capsule Take 1 capsule (0.4 mg total) by mouth daily. For renal calculi 12/06/21   Elsie Stain, MD  loratadine (CLARITIN) 10 MG tablet Take 1 tablet (10 mg total) by mouth daily. Patient  not taking: No sig reported 06/03/17 07/20/20  [name], MD      Allergies    Lisinopril    Review of Systems   Review of Systems  Musculoskeletal:  Positive for back pain.  All other systems reviewed and are negative.   Physical Exam Updated Vital Signs BP (!) 141/80 (BP Location: Right Arm)   Pulse 61   Temp 97.8 F (36.6 C)   Resp 15   SpO2 97%  Physical Exam Vitals and nursing note reviewed.  Constitutional:      Comments: Disheveled  HENT:     Head: Normocephalic.     Nose: Nose normal.     Mouth/Throat:     Mouth: Mucous membranes are dry.  Eyes:     Extraocular Movements: Extraocular movements intact.     Pupils: Pupils  are equal, round, and reactive to light.  Cardiovascular:     Rate and Rhythm: Normal rate and regular rhythm.     Pulses: Normal pulses.     Heart sounds: Normal heart sounds.  Pulmonary:     Effort: Pulmonary effort is normal.     Breath sounds: Normal breath sounds.  Abdominal:     General: Abdomen is flat.  Musculoskeletal:     Cervical back: Normal range of motion and neck supple.     Comments: Back brace in place. Mild lower paralumbar tenderness  Skin:    General: Skin is warm.     Capillary Refill: Capillary refill takes less than 2 seconds.  Neurological:     General: No focal deficit present.     Mental Status: He is oriented to person, place, and time.     Comments: No saddle anesthesia.  Patient has normal reflexes bilateral knees.  Patient is able to ambulate by himself  Psychiatric:        Mood and Affect: Mood normal.        Behavior: Behavior normal.     ED Results / Procedures / Treatments   Labs (all labs ordered are listed, but only abnormal results are displayed) Labs Reviewed  CBG MONITORING, ED - Abnormal; Notable for the following components:      Result Value   Glucose-Capillary 267 (*)    All other components within normal limits    EKG None  Radiology No results found.  Procedures Procedures    Medications Ordered in ED Medications  methocarbamol (ROBAXIN) tablet 500 mg (has no administration in time range)  ibuprofen (ADVIL) tablet 800 mg (has no administration in time range)    ED Course/ Medical Decision Making/ A&P                           Medical Decision Making David Wyatt is a 61 y.o. male here presenting with back pain.  Patient came in at 4 AM was feeling cold.  Patient has chronic back pain.  Patient has nonfocal neuro exam.  Patient is homeless and given a sandwich.  Stable for discharge and gave a list of resources.   Problems Addressed: Chronic low back pain without sciatica, unspecified back pain laterality:  chronic illness or injury Homelessness: chronic illness or injury  Risk Prescription drug management.    Final Clinical Impression(s) / ED Diagnoses Final diagnoses:  None    Rx / DC Orders ED Discharge Orders     None         Drenda Freeze, MD 01/07/22 1520

## 2022-01-07 NOTE — ED Triage Notes (Signed)
Pt arrived by [facility] after called out from gas station where patient was trying to sleep.  He was complaining of chronic sciatica pain, hungry, and cold. Pt is diabetic and [facility]G 310 for EMS.

## 2022-01-07 NOTE — ED Provider Triage Note (Signed)
Emergency Medicine Provider Triage Evaluation Note  David Wyatt , a 61 y.o. male  was evaluated in triage.  Pt complains of being cold and hungry.  Patient states he is tired walking all day, this is aggravated his chronic sciatica.  No acute change in his back pain.  No recent injuries.  He was trying to sleep at a gas station and someone called EMS..  Review of Systems  Per above  Physical Exam  BP 131/89 (BP Location: Left Arm)   Pulse 88   Temp 98.4 F (36.9 C) (Oral)   Resp 18   SpO2 98%  Gen:   Awake, no distress   Resp:  Normal effort  MSK:   Moves extremities without difficulty   Medical Decision Making  Medically screening exam initiated at 4:38 AM.  Appropriate orders placed.  [name] was informed that the remainder of the evaluation will be completed by another provider, this initial triage assessment does not replace that evaluation, and the importance of remaining in the ED until their evaluation is complete.  Cold exposure   Amaryllis Dyke, PA-C 01/07/22 0762

## 2022-01-07 NOTE — ED Notes (Signed)
Patient currently eating [place] sandwich.

## 2022-01-07 NOTE — Discharge Instructions (Signed)
Take Tylenol or Motrin for pain.  Take Robaxin for muscle spasm  Please call shelters and follow-up with your doctor  Return to ER if you have worse back pain, unable to walk

## 2022-01-08 ENCOUNTER — Other Ambulatory Visit: Payer: Self-pay

## 2022-01-08 ENCOUNTER — Emergency Department (HOSPITAL_COMMUNITY)
Admission: EM | Admit: 2022-01-08 | Discharge: 2022-01-08 | Disposition: A | Discharge status: Home / Self Care | Payer: Medicaid Other | Attending: Emergency Medicine | Admitting: Emergency Medicine

## 2022-01-08 ENCOUNTER — Encounter (HOSPITAL_COMMUNITY): Payer: Self-pay

## 2022-01-08 DIAGNOSIS — G8929 Other chronic pain: Secondary | ICD-10-CM | POA: Insufficient documentation

## 2022-01-08 DIAGNOSIS — L905 Scar conditions and fibrosis of skin: Secondary | ICD-10-CM | POA: Insufficient documentation

## 2022-01-08 DIAGNOSIS — Z7984 Long term (current) use of oral hypoglycemic drugs: Secondary | ICD-10-CM | POA: Insufficient documentation

## 2022-01-08 DIAGNOSIS — E1140 Type 2 diabetes mellitus with diabetic neuropathy, unspecified: Secondary | ICD-10-CM | POA: Insufficient documentation

## 2022-01-08 DIAGNOSIS — Z794 Long term (current) use of insulin: Secondary | ICD-10-CM | POA: Insufficient documentation

## 2022-01-08 DIAGNOSIS — M545 Low back pain, unspecified: Secondary | ICD-10-CM | POA: Diagnosis not present

## 2022-01-08 MED ORDER — ACETAMINOPHEN 500 MG PO TABS
1000.0000 mg | ORAL_TABLET | Freq: Once | ORAL | Status: AC
  Administered 2022-01-08: 1000 mg via ORAL
  Filled 2022-01-08: qty 2

## 2022-01-08 NOTE — ED Provider Notes (Signed)
[facility] EMERGENCY DEPARTMENT Provider Note   CSN: [id number] Arrival date & time: 01/08/22  0336     History  Chief Complaint  Patient presents with   Back Pain    David Wyatt is a 61 y.o. male.   Back Pain    Patient with medical history of unstable housing, cocaine abuse, diabetes, diabetic neuropathy, chronic back pain presents today due to low back pain.  The back pain is been going on for years but worse in the last few days.  It is down his left lower extremity when he walks.  No pain at rest.  Denies any fevers, urinary tension, fecal incontinence, saddle anesthesia, recent IV drug use.  He has not had any medicine at home for the pain.  Home Medications Prior to Admission medications   Medication Sig Start Date End Date Taking? Authorizing Provider  Accu-Chek Softclix Lancets lancets Use up to 4 times daily as directed 07/04/21   Barrett, Rhonda G, PA-C  amLODipine (NORVASC) 10 MG tablet Take 1 tablet (10 mg total) by mouth daily. 10/04/21   Wright, Patrick E, MD  atorvastatin (LIPITOR) 40 MG tablet Take 1 tablet (40 mg total) by mouth daily. 10/04/21   Wright, Patrick E, MD  Blood Glucose Monitoring Suppl (ACCU-CHEK GUIDE) w/Device KIT USE AS DIRECTED TO CHECK BLOOD SUGARS UP TO TWICE PER DAY 07/05/21   Newlin, Enobong, MD  DULoxetine (CYMBALTA) 60 MG capsule Take 1 capsule (60 mg total) by mouth daily. 12/06/21   Wright, Patrick E, MD  empagliflozin (JARDIANCE) 10 MG TABS tablet Take 1 tablet (10 mg total) by mouth daily before breakfast. 12/06/21   Wright, Patrick E, MD  gabapentin (NEURONTIN) 300 MG capsule Take 2 capsules (600 mg total) by mouth 3 (three) times daily. 10/04/21   Wright, Patrick E, MD  glipiZIDE (GLUCOTROL XL) 10 MG 24 hr tablet Take 1 tablet (10 mg total) by mouth daily with breakfast. 12/13/21   Wright, Patrick E, MD  glucose blood (ACCU-CHEK GUIDE) test strip Use up to 3 times daily as directed 09/20/21   Wright, Patrick E, MD   hydrOXYzine (ATARAX) 25 MG tablet Take 1 tablet (25 mg total) by mouth at bedtime. 11/09/21   Wright, Patrick E, MD  ibuprofen (ADVIL) 800 MG tablet Take 1 tablet (800 mg total) by mouth 3 (three) times daily. 01/07/22   Yao, David Hsienta, MD  Insulin Glargine Solostar (LANTUS) 100 UNIT/ML Solostar Pen Inject 24 Units into the skin daily. 11/29/21   Wright, Patrick E, MD  insulin lispro (HUMALOG) 100 UNIT/ML KwikPen Inject 5 Units into the skin 2 (two) times daily before lunch and supper. 11/29/21   Wright, Patrick E, MD  Insulin Pen Needle 31G X 8 MM MISC Use with insulin pen 10/04/21   Wright, Patrick E, MD  meloxicam (MOBIC) 15 MG tablet Take 1 tablet (15 mg total) by mouth daily. 09/05/21   Mayers, Cari S, PA-C  metFORMIN (GLUCOPHAGE) 1000 MG tablet Take 1 tablet (1,000 mg total) by mouth 2 (two) times daily. 12/06/21   Wright, Patrick E, MD  methocarbamol (ROBAXIN) 500 MG tablet Take 1 tablet (500 mg total) by mouth 3 times/day as needed-between meals & bedtime for muscle spasms. 01/07/22   Yao, David Hsienta, MD  NEOMYCIN-POLYMYXIN-HYDROCORTISONE (CORTISPORIN) 1 % SOLN OTIC solution Place 3 drops into both ears 4 (four) times daily. 08/15/21   Wright, Patrick E, MD  tamsulosin (FLOMAX) 0.4 MG CAPS capsule Take 1 capsule (0.4 mg total) by   mouth daily. For renal calculi 12/06/21   Wright, Patrick E, MD  loratadine (CLARITIN) 10 MG tablet Take 1 tablet (10 mg total) by mouth daily. Patient not taking: No sig reported 06/03/17 07/20/20  Yelverton, David, MD      Allergies    Lisinopril    Review of Systems   Review of Systems  Musculoskeletal:  Positive for back pain.    Physical Exam Updated Vital Signs BP (!) 134/102 (BP Location: Right Arm)   Pulse 66   Temp 97.9 F (36.6 C)   Resp 17   SpO2 99%  Physical Exam Vitals and nursing note reviewed. Exam conducted with a chaperone present.  Constitutional:      Appearance: Normal appearance.     Comments: Disheveled appearance, malodorous   HENT:     Head: Normocephalic and atraumatic.  Eyes:     General: No scleral icterus.       Right eye: No discharge.        Left eye: No discharge.     Extraocular Movements: Extraocular movements intact.     Pupils: Pupils are equal, round, and reactive to light.  Cardiovascular:     Rate and Rhythm: Normal rate and regular rhythm.     Pulses: Normal pulses.     Heart sounds: Normal heart sounds. No murmur heard.    No friction rub. No gallop.  Pulmonary:     Effort: Pulmonary effort is normal. No respiratory distress.     Breath sounds: Normal breath sounds.  Abdominal:     General: Abdomen is flat. Bowel sounds are normal. There is no distension.     Palpations: Abdomen is soft.     Tenderness: There is no abdominal tenderness.  Musculoskeletal:        General: Tenderness present.     Comments: Surgical scar over lumbar spine.  No midline lumbar tenderness.  Skin:    General: Skin is warm and dry.     Coloration: Skin is not jaundiced.  Neurological:     Mental Status: He is alert. Mental status is at baseline.     Coordination: Coordination normal.     Comments: Cranial nerves II through XII are grossly intact.  Upper and lower extremity strength symmetric bilaterally.  Able to raise lower extremities against gravity and resistance.  Dorsiflexion plantarflexion 5/5 bilaterally.     ED Results / Procedures / Treatments   Labs (all labs ordered are listed, but only abnormal results are displayed) Labs Reviewed - No data to display  EKG None  Radiology No results found.  Procedures Procedures    Medications Ordered in ED Medications  acetaminophen (TYLENOL) tablet 1,000 mg (has no administration in time range)    ED Course/ Medical Decision Making/ A&P                           Medical Decision Making  This is a 61-year-old male presenting today with acute on chronic low back pain.  On exam he has no focal deficits, he is ambulatory with a steady gait.   Denies any recent trauma or injuries.  Considered imaging but given lack of recent injury I do not think it is likely.  There is no red flag symptoms that be suggestive of cauda equina.  He is afebrile, doubt epidural abscess.  Additionally is not having any systemic illness.  I do think unfortunately his homelessness is playing a role to the ED visit   today.  Requesting food and blankets states it is cold outside.  Food was provided.  I also ordered Tylenol for the pain.  I reevaluated the patient who is feeling improved.  I do not think additional work-up or imaging is indicated.  Patient is stable and appropriate for outpatient follow-up.        Final Clinical Impression(s) / ED Diagnoses Final diagnoses:  None    Rx / DC Orders ED Discharge Orders     None         Sherrill Raring, [place] 01/08/22 1113    Tegeler, Gwenyth Allegra, MD 01/08/22 1329

## 2022-01-08 NOTE — ED Triage Notes (Signed)
[place] the streets with lower back pain. Hx of sciatica.

## 2022-01-08 NOTE — Discharge Instructions (Signed)
Continue taking your home medications.  Follow-up with your primary doctor.  If you continue having back pain follow-up with orthopedics for additional evaluation.  Return to the ED for loss of bladder or bowel function, new or concerning symptoms, fevers.

## 2022-01-08 NOTE — ED Provider Triage Note (Signed)
  Emergency Medicine Provider Triage Evaluation Note  MRN:  [id number]  Arrival date & time: 01/08/22    Medically screening exam initiated at 3:59 AM.   CC:   Back Pain   HPI:  Lilburn Straw is a 61 y.o. year-old male presents to the ED with chief complaint of homelessness and being cold.  He states that he was just seen, but is cold and doesn't have anywhere to stay.  States that he gets some money later today and will get his medications.  History provided by patient. ROS:  -As included in HPI PE:   Vitals:   01/08/22 0339  BP: (!) 142/96  Pulse: 81  Resp: 16  Temp: 98.2 F (36.8 C)  SpO2: 97%    Non-toxic appearing No respiratory distress  MDM:  Based on signs and symptoms, malingering is highest on my differential, followed by .   Patient was informed that the remainder of the evaluation will be completed by another provider, this initial triage assessment does not replace that evaluation, and the importance of remaining in the ED until their evaluation is complete.    Montine Circle, PA-C 01/08/22 0401

## 2022-01-15 ENCOUNTER — Ambulatory Visit: Payer: Medicaid Other | Admitting: Family Medicine

## 2022-01-18 ENCOUNTER — Emergency Department (HOSPITAL_COMMUNITY)
Admission: EM | Admit: 2022-01-18 | Discharge: 2022-01-19 | Disposition: A | Discharge status: Home / Self Care | Payer: Medicaid Other | Attending: Emergency Medicine | Admitting: Emergency Medicine

## 2022-01-18 ENCOUNTER — Other Ambulatory Visit: Payer: Self-pay

## 2022-01-18 ENCOUNTER — Encounter (HOSPITAL_COMMUNITY): Payer: Self-pay | Admitting: Emergency Medicine

## 2022-01-18 DIAGNOSIS — F1910 Other psychoactive substance abuse, uncomplicated: Secondary | ICD-10-CM

## 2022-01-18 DIAGNOSIS — R739 Hyperglycemia, unspecified: Secondary | ICD-10-CM

## 2022-01-18 DIAGNOSIS — Z760 Encounter for issue of repeat prescription: Secondary | ICD-10-CM | POA: Insufficient documentation

## 2022-01-18 DIAGNOSIS — E1165 Type 2 diabetes mellitus with hyperglycemia: Secondary | ICD-10-CM | POA: Insufficient documentation

## 2022-01-18 DIAGNOSIS — Z79899 Other long term (current) drug therapy: Secondary | ICD-10-CM | POA: Insufficient documentation

## 2022-01-18 DIAGNOSIS — Z794 Long term (current) use of insulin: Secondary | ICD-10-CM | POA: Insufficient documentation

## 2022-01-18 DIAGNOSIS — I10 Essential (primary) hypertension: Secondary | ICD-10-CM | POA: Diagnosis not present

## 2022-01-18 DIAGNOSIS — Z5900 Homelessness unspecified: Secondary | ICD-10-CM | POA: Insufficient documentation

## 2022-01-18 DIAGNOSIS — F1410 Cocaine abuse, uncomplicated: Secondary | ICD-10-CM | POA: Insufficient documentation

## 2022-01-18 DIAGNOSIS — Z7984 Long term (current) use of oral hypoglycemic drugs: Secondary | ICD-10-CM | POA: Diagnosis not present

## 2022-01-18 LAB — CBC
HCT: 41 % (ref 39.0–52.0)
Hemoglobin: 13.7 g/dL (ref 13.0–17.0)
MCH: 30.6 pg (ref 26.0–34.0)
MCHC: 33.4 g/dL (ref 30.0–36.0)
MCV: 91.7 fL (ref 80.0–100.0)
Platelets: 445 10*3/uL — ABNORMAL HIGH (ref 150–400)
RBC: 4.47 MIL/uL (ref 4.22–5.81)
RDW: 14.2 % (ref 11.5–15.5)
WBC: 5.3 10*3/uL (ref 4.0–10.5)
nRBC: 0 % (ref 0.0–0.2)

## 2022-01-18 LAB — URINALYSIS, ROUTINE W REFLEX MICROSCOPIC
Bilirubin Urine: NEGATIVE
Glucose, UA: >=500 mg/dL — AB
Hgb urine dipstick: NEGATIVE
Ketones, ur: NEGATIVE mg/dL
Nitrite: NEGATIVE
Protein, ur: NEGATIVE mg/dL
Specific Gravity, Urine: 1.025 (ref 1.005–1.030)
pH: 6 (ref 5.0–8.0)

## 2022-01-18 LAB — COMPREHENSIVE METABOLIC PANEL
ALT: 9 U/L (ref 0–44)
AST: 18 U/L (ref 15–41)
Albumin: 3.2 g/dL — ABNORMAL LOW (ref 3.5–5.0)
Alkaline Phosphatase: 95 U/L (ref 38–126)
Anion gap: 14 (ref 5–15)
BUN: 9 mg/dL (ref 8–23)
CO2: 22 mmol/L (ref 22–32)
Calcium: 9.1 mg/dL (ref 8.9–10.3)
Chloride: 100 mmol/L (ref 98–111)
Creatinine, Ser: 1.23 mg/dL (ref 0.61–1.24)
GFR, Estimated: >60 mL/min (ref >60)
Glucose, Bld: 499 mg/dL — ABNORMAL HIGH (ref 70–99)
Potassium: 4.3 mmol/L (ref 3.5–5.1)
Sodium: 136 mmol/L (ref 135–145)
Total Bilirubin: 0.3 mg/dL (ref 0.3–1.2)
Total Protein: 6.6 g/dL (ref 6.5–8.1)

## 2022-01-18 LAB — RAPID URINE DRUG SCREEN, HOSP PERFORMED
Amphetamines: NOT DETECTED
Barbiturates: NOT DETECTED
Benzodiazepines: NOT DETECTED
Cocaine: POSITIVE — AB
Opiates: NOT DETECTED
Tetrahydrocannabinol: NOT DETECTED

## 2022-01-18 LAB — CBG MONITORING, ED: Glucose-Capillary: 483 mg/dL — ABNORMAL HIGH (ref 70–99)

## 2022-01-18 NOTE — ED Triage Notes (Signed)
Patient reports he ran out of all of his medications for > 1 month , intermittent low back pain with generalized weakness/fatigue . Patient also requesting detox for crack addiction .

## 2022-01-18 NOTE — ED Provider Triage Note (Signed)
Emergency Medicine Provider Triage Evaluation Note  Agostino Gorin , a 61 y.o. male  was evaluated in triage.  Pt complains of needing his medication for HTN and diabetes, also requesting rehab for substance abuse. Has been out of his medicine for 1 month. Last used crack cocaine yesterday. Complaining of pain in his lower back, right arm/hand, and bilateral legs. Supposed to be amlodipine and insulin.   Review of Systems  Positive: As above, fatigue Negative: Headache, CP, N/V/D  Physical Exam  BP (!) 167/93   Pulse 82   Temp 99.6 F (37.6 C) (Oral)   Resp 18   SpO2 97%  Gen:   Awake, no distress   Resp:  Normal effort  MSK:   Moves extremities without difficulty  Other:    Medical Decision Making  Medically screening exam initiated at 8:07 PM.  Appropriate orders placed.  [name] was informed that the remainder of the evaluation will be completed by another provider, this initial triage assessment does not replace that evaluation, and the importance of remaining in the ED until their evaluation is complete.     Kateri Plummer, Hershal Coria 01/18/22 2007

## 2022-01-19 LAB — CBG MONITORING, ED
Glucose-Capillary: 164 mg/dL — ABNORMAL HIGH (ref 70–99)
Glucose-Capillary: 456 mg/dL — ABNORMAL HIGH (ref 70–99)
Glucose-Capillary: 515 mg/dL (ref 70–99)
Glucose-Capillary: 586 mg/dL (ref 70–99)

## 2022-01-19 MED ORDER — SODIUM CHLORIDE 0.9 % IV BOLUS
1000.0000 mL | Freq: Once | INTRAVENOUS | Status: AC
  Administered 2022-01-19: 1000 mL via INTRAVENOUS

## 2022-01-19 MED ORDER — AMLODIPINE BESYLATE 5 MG PO TABS
10.0000 mg | ORAL_TABLET | Freq: Every day | ORAL | Status: DC
  Administered 2022-01-19: 10 mg via ORAL
  Filled 2022-01-19: qty 2

## 2022-01-19 MED ORDER — INSULIN ASPART 100 UNIT/ML IJ SOLN
5.0000 [IU] | Freq: Once | INTRAMUSCULAR | Status: AC
  Administered 2022-01-19: 5 [IU] via SUBCUTANEOUS

## 2022-01-19 MED ORDER — SODIUM CHLORIDE 0.9 % IV BOLUS
500.0000 mL | Freq: Once | INTRAVENOUS | Status: AC
  Administered 2022-01-19: 500 mL via INTRAVENOUS

## 2022-01-19 MED ORDER — GLIPIZIDE ER 10 MG PO TB24
10.0000 mg | ORAL_TABLET | Freq: Every day | ORAL | Status: DC
  Administered 2022-01-19: 10 mg via ORAL
  Filled 2022-01-19 (×2): qty 1

## 2022-01-19 MED ORDER — INSULIN ASPART PROT & ASPART (70-30 MIX) 100 UNIT/ML ~~LOC~~ SUSP
5.0000 [IU] | Freq: Once | SUBCUTANEOUS | Status: AC
  Administered 2022-01-19: 5 [IU] via SUBCUTANEOUS
  Filled 2022-01-19: qty 10

## 2022-01-19 MED ORDER — METFORMIN HCL 500 MG PO TABS
1000.0000 mg | ORAL_TABLET | Freq: Two times a day (BID) | ORAL | Status: DC
  Administered 2022-01-19: 1000 mg via ORAL
  Filled 2022-01-19: qty 2

## 2022-01-19 NOTE — ED Provider Notes (Signed)
  Physical Exam  BP (!) 165/92   Pulse 77   Temp 98.4 F (36.9 C) (Oral)   Resp 18   SpO2 100%     Procedures  Procedures  ED Course / MDM    Medical Decision Making Risk Prescription drug management.   50M presenting with hyperglycemia, crack cocaine use, social work issues. Pt has not been able to afford his medications or transportation to a pharmacy. Social work consulted, CBG elevated, not in DKA.  On repeat assessment, the patient was hemodynamically stable.  He complained of bilateral foot pain "for as long as I have had diabetes."  He endorses some claudication.  He endorses a smoking history.  He is not sure if he has ever been diagnosed with peripheral vascular disease.  He has 1+ DP pulses in the bilateral lower extremities.  I do not see an acute vascular emergency at this time.  The patient's CBG was notably 164 on recheck. Overall improved, plan for outpatient follow-up. Referral to internal medicine placed as the patient currently does not have a PCP. Social work assisted with recommendations, placed resources for the patient to follow-up with for drug rehabilitation.        Regan Lemming, MD 01/19/22 [phone]

## 2022-01-19 NOTE — Care Management (Signed)
Patient presented with not feeling well. Has not picked up medications ( has refills) to control BP and blood sugars. He uses cocaine daily.  And is interested in detox programs.  SA resources added to AVS. The patient has Medicaid therefore does not qualify for [facility] medication assistance.

## 2022-01-19 NOTE — Discharge Instructions (Addendum)
                  Intensive Outpatient Programs  [facility]    [facility] [address]     [address] [place],  [place]     [place], [place] [phone]      [phone]  [facility]   [facility]  (Inpatient and outpatient)  [phone] (Suboxone and Methadone) [address]           [phone]           [facility]    [facility] - Intensive Outpatient [address]     [address] [place], [place] [address]     [place], [place]  [phone]      [phone]  [facility] (Outpatient, Inpatient, [facility]  [facility] (Groups and Residental) (insurance only) [phone]    [place], [place]          [phone]       [facility]    [facility] (for caregivers and family) [address]     [address] [place], [place]     [place], [place] [phone]      [phone]  Residential Treatment Programs  [facility]  [facility](2 years) Residential: 90 days)  [facility] [address]      [address] [place], [place]     [place], [place] [phone]      [phone] or [phone]  [facility]    [facility] [address]      [address] [place], [place]     [place], [place] [phone]      [phone]  [facility]   [facility] ([facility] [address]     [address] [place], [place] [address]     [place], [place] [phone]      [phone] Admissions: 8am-3pm M-F  [facility]: Residential Program (90 Days)              [facility]: [facility]  [place], [place]     [place], [place]  [phone] or [phone]    (Walk in Hours over the weekend or by referral)   [facility]: [facility]:[phone] (for crisis  response 24 hours a day) 

## 2022-01-19 NOTE — Progress Notes (Signed)
CSW spoke with patient's sister [name] to discuss discharge plan - she is aware that residential substance abuse resources were placed on patient's AVS to follow up. [name] states she is out of town right now but will assist her brother in locating placement if he desires to do so. All questions answered - no additional needs at this time.  Madilyn Fireman, MSW, LCSW Transitions of Care  Clinical Social Worker II [phone]
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
## 2022-01-20 ENCOUNTER — Other Ambulatory Visit: Payer: Self-pay

## 2022-01-20 ENCOUNTER — Emergency Department (HOSPITAL_COMMUNITY)
Admission: EM | Admit: 2022-01-20 | Discharge: 2022-01-21 | Disposition: A | Discharge status: Home / Self Care | Payer: Medicaid Other | Attending: Emergency Medicine | Admitting: Emergency Medicine

## 2022-01-20 ENCOUNTER — Encounter (HOSPITAL_COMMUNITY): Payer: Self-pay

## 2022-01-20 DIAGNOSIS — Z1152 Encounter for screening for COVID-19: Secondary | ICD-10-CM | POA: Diagnosis not present

## 2022-01-20 DIAGNOSIS — I10 Essential (primary) hypertension: Secondary | ICD-10-CM | POA: Diagnosis not present

## 2022-01-20 DIAGNOSIS — R45851 Suicidal ideations: Secondary | ICD-10-CM | POA: Diagnosis not present

## 2022-01-20 DIAGNOSIS — F17210 Nicotine dependence, cigarettes, uncomplicated: Secondary | ICD-10-CM | POA: Insufficient documentation

## 2022-01-20 DIAGNOSIS — E1165 Type 2 diabetes mellitus with hyperglycemia: Secondary | ICD-10-CM | POA: Insufficient documentation

## 2022-01-20 DIAGNOSIS — Z7984 Long term (current) use of oral hypoglycemic drugs: Secondary | ICD-10-CM | POA: Diagnosis not present

## 2022-01-20 DIAGNOSIS — Z794 Long term (current) use of insulin: Secondary | ICD-10-CM | POA: Insufficient documentation

## 2022-01-20 DIAGNOSIS — F1490 Cocaine use, unspecified, uncomplicated: Secondary | ICD-10-CM | POA: Diagnosis present

## 2022-01-20 DIAGNOSIS — R739 Hyperglycemia, unspecified: Secondary | ICD-10-CM

## 2022-01-20 DIAGNOSIS — Z79899 Other long term (current) drug therapy: Secondary | ICD-10-CM | POA: Diagnosis not present

## 2022-01-20 DIAGNOSIS — R4589 Other symptoms and signs involving emotional state: Secondary | ICD-10-CM

## 2022-01-20 LAB — CBG MONITORING, ED
Glucose-Capillary: 344 mg/dL — ABNORMAL HIGH (ref 70–99)
Glucose-Capillary: 479 mg/dL — ABNORMAL HIGH (ref 70–99)

## 2022-01-20 LAB — COMPREHENSIVE METABOLIC PANEL
ALT: 10 U/L (ref 0–44)
AST: 14 U/L — ABNORMAL LOW (ref 15–41)
Albumin: 3.4 g/dL — ABNORMAL LOW (ref 3.5–5.0)
Alkaline Phosphatase: 101 U/L (ref 38–126)
Anion gap: 9 (ref 5–15)
BUN: 12 mg/dL (ref 8–23)
CO2: 24 mmol/L (ref 22–32)
Calcium: 9.4 mg/dL (ref 8.9–10.3)
Chloride: 103 mmol/L (ref 98–111)
Creatinine, Ser: 1.01 mg/dL (ref 0.61–1.24)
GFR, Estimated: >60 mL/min (ref >60)
Glucose, Bld: 360 mg/dL — ABNORMAL HIGH (ref 70–99)
Potassium: 4 mmol/L (ref 3.5–5.1)
Sodium: 136 mmol/L (ref 135–145)
Total Bilirubin: 0.3 mg/dL (ref 0.3–1.2)
Total Protein: 7.3 g/dL (ref 6.5–8.1)

## 2022-01-20 LAB — CBC WITH DIFFERENTIAL/PLATELET
Abs Immature Granulocytes: 0.01 10*3/uL (ref 0.00–0.07)
Basophils Absolute: 0 10*3/uL (ref 0.0–0.1)
Basophils Relative: 1 %
Eosinophils Absolute: 0.1 10*3/uL (ref 0.0–0.5)
Eosinophils Relative: 2 %
HCT: 43.1 % (ref 39.0–52.0)
Hemoglobin: 14.3 g/dL (ref 13.0–17.0)
Immature Granulocytes: 0 %
Lymphocytes Relative: 29 %
Lymphs Abs: 1.5 10*3/uL (ref 0.7–4.0)
MCH: 30.2 pg (ref 26.0–34.0)
MCHC: 33.2 g/dL (ref 30.0–36.0)
MCV: 91.1 fL (ref 80.0–100.0)
Monocytes Absolute: 0.4 10*3/uL (ref 0.1–1.0)
Monocytes Relative: 7 %
Neutro Abs: 3.2 10*3/uL (ref 1.7–7.7)
Neutrophils Relative %: 61 %
Platelets: 444 10*3/uL — ABNORMAL HIGH (ref 150–400)
RBC: 4.73 MIL/uL (ref 4.22–5.81)
RDW: 14.1 % (ref 11.5–15.5)
WBC: 5.2 10*3/uL (ref 4.0–10.5)
nRBC: 0 % (ref 0.0–0.2)

## 2022-01-20 LAB — RAPID URINE DRUG SCREEN, HOSP PERFORMED
Amphetamines: NOT DETECTED
Barbiturates: NOT DETECTED
Benzodiazepines: NOT DETECTED
Cocaine: POSITIVE — AB
Opiates: NOT DETECTED
Tetrahydrocannabinol: NOT DETECTED

## 2022-01-20 LAB — ETHANOL: Alcohol, Ethyl (B): <10 mg/dL (ref <10)

## 2022-01-20 LAB — RESP PANEL BY RT-PCR (FLU A&B, COVID) ARPGX2
Influenza A by PCR: NEGATIVE
Influenza B by PCR: NEGATIVE
SARS Coronavirus 2 by RT PCR: NEGATIVE

## 2022-01-20 LAB — SALICYLATE LEVEL: Salicylate Lvl: <7 mg/dL — ABNORMAL LOW (ref 7.0–30.0)

## 2022-01-20 LAB — ACETAMINOPHEN LEVEL: Acetaminophen (Tylenol), Serum: <10 ug/mL — ABNORMAL LOW (ref 10–30)

## 2022-01-20 MED ORDER — GLIPIZIDE ER 10 MG PO TB24
10.0000 mg | ORAL_TABLET | Freq: Every day | ORAL | Status: DC
  Administered 2022-01-21: 10 mg via ORAL
  Filled 2022-01-20 (×2): qty 1

## 2022-01-20 MED ORDER — LIP MEDEX EX OINT: 1.0000 | TOPICAL_OINTMENT | Freq: Once | CUTANEOUS | Status: DC

## 2022-01-20 MED ORDER — HYDROXYZINE HCL 25 MG PO TABS
25.0000 mg | ORAL_TABLET | Freq: Every day | ORAL | Status: DC
  Administered 2022-01-20: 25 mg via ORAL
  Filled 2022-01-20: qty 1

## 2022-01-20 MED ORDER — AMLODIPINE BESYLATE 5 MG PO TABS
10.0000 mg | ORAL_TABLET | Freq: Every day | ORAL | Status: DC
  Administered 2022-01-20 – 2022-01-21 (×2): 10 mg via ORAL
  Filled 2022-01-20 (×2): qty 2

## 2022-01-20 MED ORDER — ACETAMINOPHEN 325 MG PO TABS
650.0000 mg | ORAL_TABLET | ORAL | Status: DC | PRN
  Administered 2022-01-20: 650 mg via ORAL
  Filled 2022-01-20: qty 2

## 2022-01-20 MED ORDER — EMPAGLIFLOZIN 10 MG PO TABS
10.0000 mg | ORAL_TABLET | Freq: Every day | ORAL | Status: DC
  Administered 2022-01-21: 10 mg via ORAL
  Filled 2022-01-20 (×2): qty 1

## 2022-01-20 MED ORDER — GABAPENTIN 300 MG PO CAPS
600.0000 mg | ORAL_CAPSULE | Freq: Three times a day (TID) | ORAL | Status: DC
  Administered 2022-01-20 – 2022-01-21 (×3): 600 mg via ORAL
  Filled 2022-01-20 (×3): qty 2

## 2022-01-20 MED ORDER — ONDANSETRON HCL 4 MG PO TABS: 4.0000 mg | ORAL_TABLET | Freq: Three times a day (TID) | ORAL | Status: DC | PRN

## 2022-01-20 MED ORDER — DULOXETINE HCL 60 MG PO CPEP
60.0000 mg | ORAL_CAPSULE | Freq: Every day | ORAL | Status: DC
  Administered 2022-01-20 – 2022-01-21 (×2): 60 mg via ORAL
  Filled 2022-01-20 (×2): qty 2

## 2022-01-20 MED ORDER — NICOTINE 21 MG/24HR TD PT24
21.0000 mg | MEDICATED_PATCH | Freq: Every day | TRANSDERMAL | Status: DC
  Administered 2022-01-20 – 2022-01-21 (×2): 21 mg via TRANSDERMAL
  Filled 2022-01-20 (×2): qty 1

## 2022-01-20 MED ORDER — METFORMIN HCL 500 MG PO TABS
1000.0000 mg | ORAL_TABLET | Freq: Two times a day (BID) | ORAL | Status: DC
  Administered 2022-01-20 – 2022-01-21 (×2): 1000 mg via ORAL
  Filled 2022-01-20 (×2): qty 2

## 2022-01-20 NOTE — ED Notes (Signed)
Spoke with RN regarding our NP wanting patient to come to [facility]

## 2022-01-20 NOTE — ED Notes (Addendum)
Patients belongings inventoried and placed in locker #5 Patient's wallet and phone in bags, declined for them to be locked with security.

## 2022-01-20 NOTE — ED Provider Notes (Signed)
[facility] EMERGENCY DEPARTMENT Provider Note   CSN: [id number] Arrival date & time: 01/20/22  1314     History {Add pertinent medical, surgical, social history, OB history to HPI:1} Chief Complaint  Patient presents with   Psychiatric Evaluation    David Wyatt is a 61 y.o. male.  Patient presenting today with complaint of suicidal ideation.  Not have a specific plan.  But is adamant that he is suicidal he is fed up with being homeless.  Patient was seen on November 10 for hyperglycemia social worker was involved.  Patient states he does not have any of his diabetic meds and has not taking any of them.  Patient seen October 31 for chronic low back pain and was discharged at that time.  Also seen October 30 for chronic low back pain.  Seen October 25 for homelessness.  Past medical history significant for hypertension cocaine abuse type 2 diabetes neuropathy chronic back pain high cholesterol and diabetic neuropathy.  Surgical history is significant for inguinal hernia repair in 2014 follow-up inguinal hernia repair on the left in 2018 with insertion of mesh.  And cystoscopy with ureteral stent placement December 2022.  Patient is an everyday smoker.       Home Medications Prior to Admission medications   Medication Sig Start Date End Date Taking? Authorizing Provider  Accu-Chek Softclix Lancets lancets Use up to 4 times daily as directed 07/04/21   Barrett, Evelene Croon, PA-C  amLODipine (NORVASC) 10 MG tablet Take 1 tablet (10 mg total) by mouth daily. 10/04/21   Elsie Stain, MD  atorvastatin (LIPITOR) 40 MG tablet Take 1 tablet (40 mg total) by mouth daily. 10/04/21   Elsie Stain, MD  Blood Glucose Monitoring Suppl (ACCU-CHEK GUIDE) w/Device KIT USE AS DIRECTED TO CHECK BLOOD SUGARS UP TO TWICE PER DAY 07/05/21   [name], MD  DULoxetine (CYMBALTA) 60 MG capsule Take 1 capsule (60 mg total) by mouth daily. 12/06/21   Elsie Stain, MD   empagliflozin (JARDIANCE) 10 MG TABS tablet Take 1 tablet (10 mg total) by mouth daily before breakfast. 12/06/21   Elsie Stain, MD  gabapentin (NEURONTIN) 300 MG capsule Take 2 capsules (600 mg total) by mouth 3 (three) times daily. 10/04/21   Elsie Stain, MD  glipiZIDE (GLUCOTROL XL) 10 MG 24 hr tablet Take 1 tablet (10 mg total) by mouth daily with breakfast. 12/13/21   Elsie Stain, MD  glucose blood (ACCU-CHEK GUIDE) test strip Use up to 3 times daily as directed 09/20/21   Elsie Stain, MD  hydrOXYzine (ATARAX) 25 MG tablet Take 1 tablet (25 mg total) by mouth at bedtime. 11/09/21   Elsie Stain, MD  ibuprofen (ADVIL) 800 MG tablet Take 1 tablet (800 mg total) by mouth 3 (three) times daily. 01/07/22   Drenda Freeze, MD  Insulin Glargine Solostar (LANTUS) 100 UNIT/ML Solostar Pen Inject 24 Units into the skin daily. 11/29/21   Elsie Stain, MD  insulin lispro (HUMALOG) 100 UNIT/ML KwikPen Inject 5 Units into the skin 2 (two) times daily before lunch and supper. 11/29/21   Elsie Stain, MD  Insulin Pen Needle 31G X 8 MM MISC Use with insulin pen 10/04/21   Elsie Stain, MD  meloxicam (MOBIC) 15 MG tablet Take 1 tablet (15 mg total) by mouth daily. 09/05/21   Mayers, Cari S, PA-C  metFORMIN (GLUCOPHAGE) 1000 MG tablet Take 1 tablet (1,000 mg total) by mouth 2 (  two) times daily. 12/06/21   Elsie Stain, MD  methocarbamol (ROBAXIN) 500 MG tablet Take 1 tablet (500 mg total) by mouth 3 times/day as needed-between meals & bedtime for muscle spasms. 01/07/22   Drenda Freeze, MD  NEOMYCIN-POLYMYXIN-HYDROCORTISONE (CORTISPORIN) 1 % SOLN OTIC solution Place 3 drops into both ears 4 (four) times daily. 08/15/21   Elsie Stain, MD  tamsulosin (FLOMAX) 0.4 MG CAPS capsule Take 1 capsule (0.4 mg total) by mouth daily. For renal calculi 12/06/21   Elsie Stain, MD  loratadine (CLARITIN) 10 MG tablet Take 1 tablet (10 mg total) by mouth daily. Patient not  taking: No sig reported 06/03/17 07/20/20  [name], MD      Allergies    Lisinopril    Review of Systems   Review of Systems  Constitutional:  Negative for chills and fever.  HENT:  Negative for rhinorrhea and sore throat.   Eyes:  Negative for visual disturbance.  Respiratory:  Negative for cough and shortness of breath.   Cardiovascular:  Negative for chest pain and leg swelling.  Gastrointestinal:  Negative for abdominal pain, diarrhea, nausea and vomiting.  Genitourinary:  Negative for dysuria.  Musculoskeletal:  Negative for back pain and neck pain.  Skin:  Negative for rash.  Neurological:  Negative for dizziness, light-headedness and headaches.  Hematological:  Does not bruise/bleed easily.  Psychiatric/Behavioral:  Positive for suicidal ideas. Negative for confusion.     Physical Exam Updated Vital Signs BP (!) 167/93   Pulse 78   Temp 98.2 F (36.8 C)   Resp 16   Ht 1.727 m (_0 )   SpO2 98%   BMI 25.85 kg/m  Physical Exam Vitals and nursing note reviewed.  Constitutional:      General: He is not in acute distress.    Appearance: Normal appearance. He is well-developed.  HENT:     Head: Normocephalic and atraumatic.  Eyes:     Conjunctiva/sclera: Conjunctivae normal.     Pupils: Pupils are equal, round, and reactive to light.  Cardiovascular:     Rate and Rhythm: Normal rate and regular rhythm.     Heart sounds: No murmur heard. Pulmonary:     Effort: Pulmonary effort is normal. No respiratory distress.     Breath sounds: Normal breath sounds.  Abdominal:     Palpations: Abdomen is soft.     Tenderness: There is no abdominal tenderness.  Musculoskeletal:        General: No swelling.     Cervical back: Normal range of motion and neck supple. No rigidity.  Skin:    General: Skin is warm and dry.     Capillary Refill: Capillary refill takes less than 2 seconds.  Neurological:     General: No focal deficit present.     Mental Status: He is  alert and oriented to person, place, and time.     Cranial Nerves: No cranial nerve deficit.     Sensory: No sensory deficit.     Motor: No weakness.  Psychiatric:        Mood and Affect: Mood normal.     ED Results / Procedures / Treatments   Labs (all labs ordered are listed, but only abnormal results are displayed) Labs Reviewed  COMPREHENSIVE METABOLIC PANEL - Abnormal; Notable for the following components:      Result Value   Glucose, Bld 360 (*)    Albumin 3.4 (*)    AST 14 (*)  All other components within normal limits  CBC WITH DIFFERENTIAL/PLATELET - Abnormal; Notable for the following components:   Platelets 444 (*)    All other components within normal limits  SALICYLATE LEVEL - Abnormal; Notable for the following components:   Salicylate Lvl <9.6 (*)    All other components within normal limits  ACETAMINOPHEN LEVEL - Abnormal; Notable for the following components:   Acetaminophen (Tylenol), Serum <10 (*)    All other components within normal limits  CBG MONITORING, ED - Abnormal; Notable for the following components:   Glucose-Capillary 344 (*)    All other components within normal limits  RESP PANEL BY RT-PCR (FLU A&B, COVID) ARPGX2  ETHANOL  RAPID URINE DRUG SCREEN, HOSP PERFORMED    EKG None  Radiology No results found.  Procedures Procedures    Medications Ordered in ED Medications - No data to display  ED Course/ Medical Decision Making/ A&P                           Medical Decision Making  Patient adamant about suicidal ideations.  States he is willing to stay.  Patient keeps saying no one will help him.  His complete metabolic panel here blood sugar was 360 CO2 24 so no evidence of any acidosis.  Liver function test was normal renal function test was normal with GFR greater than 60.  No anion gap.  Alcohol was less than 10 CBC no leukocytosis hemoglobin 14.2.  Salicylate level and Tylenol levels were all normal.  Urine drug screen is still  pending.  Along with urinalysis.  Consult has been placed for TTS to evaluate the patient for the suicidal ideation.  We will hold off on doing IVC at this point in time because patient is voluntary.  In addition patient has not been taking any of his diabetic medicines.  Certainly not toxic with the hyperglycemia at this time requiring medical admission.  Social work notes were placed when he was seen just the other day with his sister.  Seems if patient may be is not wanting to seek help with his medications.  He has not picked up his most recent medications.  Currently cleared medically for behavioral health evaluation.   Final Clinical Impression(s) / ED Diagnoses Final diagnoses:  Suicidal ideation  Hyperglycemia    Rx / DC Orders ED Discharge Orders     None

## 2022-01-20 NOTE — ED Triage Notes (Signed)
Pt arrived POV stating he is homeless and he cannot take anymore of living on the streets and not being able to take care of himself. Pt states he is close to being suicidal but does not have a plan.

## 2022-01-20 NOTE — ED Notes (Signed)
IVC paperwork completed and is at the [facility]. PJ

## 2022-01-20 NOTE — Consult Note (Signed)
Telepsych Consultation   Reason for Consult:  Telepsychiatry Assessment for SI Referring Physician:  Fredia Sorrow, MD  Location of Patient:   [facility] Location of Provider: Other: virtual home office  Patient Identification: David Wyatt MRN:  [id number] Principal Diagnosis: Passive suicidal ideations Diagnosis:  Principal Problem:   Passive suicidal ideations Active Problems:   Crack cocaine use   Total Time spent with patient: 30 minutes  Subjective:   David Wyatt is a 61 y.o. male patient admitted for psychiatric evaluation for suicidal ideations, no plan to intent verbalized.  Per note review, appears patient is triggered by homelessness.  His presentation is complicated by chronic low back pain and cocaine abuse. Patient has been seen at the emergency department 3x since October 25th; 2x for chronic back pain and 1x for homelessness.  Most recently, he was seen yesterday,  Nov 11th for prescription drug management.  Pt was medically cleared and discharged home with homelessness and substance abuse resources from [facility].     HPI:   Patient seen via telepsych by this provider; chart reviewed and consulted with Dr. Dwyane Dee on 01/20/22.  On evaluation David Wyatt reports , "I smoked crack for years and I am tired of it.  I just lost 2 brother in laws, lost my mother and father year ago. " Pt reports he is tired of using drugs and staying on the streets and states that he wants to make a change.  He's requesting inpatient rehabilitation; Pt reports he stays in an abandoned truck with the window bused out.  At present he endorses passive suicidal ideations with no plan or intent for self harm.    He reports long standing history of crack cocaine use that dates back  20+ years.  He's been to rehab once but sadly relapsed shortly afterwards.  Pt denies alcohol, marijuana or other illicit drug usage.   He reports he's been homeless for most of his life after he lost family members. He  reports [facility] but was made to leave 4 months ago because he decided to do laundry after hours.  He was aware of the house rules and was made to leave [facility] afterwards.  Pt reports he was told he could return after he sobered up from drug use.     Pt states he has does not any local support.  He as a sister who lives in [place] who agrees to help him if he's able to stop using drugs.  He has 2 children who live in [place] but he has not spoken with them in over 40 years, "because I was out there in the streets." He is currently married but has separated for over 40 years.   Pt reports he is not working but he lives off his disability check.  He is 100% disabled but when asked what caused him to be disabled he can only state, "surgery."  He is unsure of the diagnosis.    During evaluation David Wyatt is laying laterally in the hospital bed, facing the camera.  He is alert/oriented x 4; anxious but otherwise cooperative; and mood congruent with affect.  Patient is speaking in a clear tone at moderate volume, and normal pace; with fair eye contact.  His thought process is coherent and relevant; There is no indication that he is currently responding to internal/external stimuli or experiencing delusional thought content.  Patient endorses passive suicidal ideations but no plan or intent for  self harm.  He denies homicidal ideation, psychosis, and paranoia. Patient has remained cooperative throughout assessment and has answered questions appropriately.    Per ED Provider Admission Assessment 01/20/2022: Chief Complaint  Patient presents with   Psychiatric Evaluation      David Wyatt is a 61 y.o. male.   Patient presenting today with complaint of suicidal ideation.  Not have a specific plan.  But is adamant that he is suicidal he is fed up with being homeless.  Patient was seen on November 10 for hyperglycemia social worker was involved.  Patient states he does  not have any of his diabetic meds and has not taking any of them.  Patient seen October 31 for chronic low back pain and was discharged at that time.  Also seen October 30 for chronic low back pain.  Seen October 25 for homelessness.   Past medical history significant for hypertension cocaine abuse type 2 diabetes neuropathy chronic back pain high cholesterol and diabetic neuropathy.  Surgical history is significant for inguinal hernia repair in 2014 follow-up inguinal hernia repair on the left in 2018 with insertion of mesh.  And cystoscopy with ureteral stent placement December 2022.  Patient is an everyday smoker.   Past Psychiatric History: crack cocaine dependence  Risk to Self:  no Risk to Others: no  Prior Inpatient Therapy: yes  Prior Outpatient Therapy:  no  Past Medical History:         Past Medical History:  Diagnosis Date   Angioedema of lips 07/28/2012   left upper (07/29/2012)   Arthritis    hands and back   Chronic back pain    Cocaine abuse (HCC)    Diabetic neuropathy (HCC)    High cholesterol    Hypertension    Neuropathy    Type II diabetes mellitus ([facility] [place] [facility])     Past Surgical History:  Procedure Laterality Date   BACK SURGERY     CYSTOSCOPY W/ URETERAL STENT PLACEMENT Right 02/23/2021   Procedure: CYSTOSCOPY WITH RETROGRADE PYELOGRAM/URETERAL STENT PLACEMENT;  Surgeon: Janith Lima, MD;  Location: [facility];  Service: Urology;  Laterality: Right;   HERNIA REPAIR Right 04/01/2012   I & D EXTREMITY Left 12/13/2012   Procedure: IRRIGATION AND DEBRIDEMENT LEFT THUMB;  Surgeon: Tennis Must, MD;  Location: [facility];  Service: Orthopedics;  Laterality: Left;   INCISION AND DRAINAGE OF WOUND     boil on back/notes 07/14/2008 (07/29/2012)   INGUINAL HERNIA REPAIR  04/01/2012   Procedure: HERNIA REPAIR INGUINAL ADULT;  Surgeon: Haywood Lasso, MD;  Location: [facility];  Service: General;  Laterality: Right;   INGUINAL HERNIA REPAIR Left 09/06/2016   Procedure: OPEN REPAIR LEFT  INGUINAL HERNIA;  Surgeon: Greer Pickerel, MD;  Location: [facility];  Service: General;  Laterality: Left;   INSERTION OF MESH Left 09/06/2016   Procedure: INSERTION OF MESH;  Surgeon: Greer Pickerel, MD;  Location: [facility];  Service: General;  Laterality: Left;   TONSILLECTOMY     Family History:  Family History  Problem Relation Age of Onset   Diabetes Mother    Kidney disease Mother    Hyperlipidemia Mother    Hyperlipidemia Father    Diabetes Father    Family Psychiatric  History: deferred Social History:  Social History   Substance and Sexual Activity  Alcohol Use No   Alcohol/week: 0.0 standard drinks of alcohol     Social History   Substance and Sexual Activity  Drug Use Not Currently  Types: "Crack" cocaine    Social History   Socioeconomic History   Marital status: Divorced    Spouse name: Not on file   Number of children: 3   Years of education: 12   Highest education level: Not on file  Occupational History    Comment: disabled  Tobacco Use   Smoking status: Every Day    Packs/day: 0.25    Years: 30.00    Total pack years: 7.50    Types: Cigarettes   Smokeless tobacco: Former    Quit date: 08/15/2015   Tobacco comments:    04/29/16 2  cigs daily, 10/27/17 sometimes < .25 PPD  Vaping Use   Vaping Use: Never used  Substance and Sexual Activity   Alcohol use: No    Alcohol/week: 0.0 standard drinks of alcohol   Drug use: Not Currently    Types: "Crack" cocaine   Sexual activity: Not on file  Other Topics Concern   Not on file  Social History Narrative   04/29/17   Lives in shelter   Caffeine- a lot of  tea, coffee   Social Determinants of Health   Financial Resource Strain: Not on file  Food Insecurity: Not on file  Transportation Needs: Not on file  Physical Activity: Not on file  Stress: Not on file  Social Connections: Not on file   Additional Social History:    Allergies:   Allergies  Allergen Reactions   Lisinopril Other (See Comments) and  Swelling    Lip Swelling, angioedema  angioedema  Lip Swelling  Lip Swelling, angioedema    Labs:  Results for orders placed or performed during the hospital encounter of 01/20/22 (from the past 48 hour(s))  Urine rapid drug screen (hosp performed)     Status: Abnormal   Collection Time: 01/20/22  1:45 PM  Result Value Ref Range   Opiates NONE DETECTED NONE DETECTED   Cocaine POSITIVE (A) NONE DETECTED   Benzodiazepines NONE DETECTED NONE DETECTED   Amphetamines NONE DETECTED NONE DETECTED   Tetrahydrocannabinol NONE DETECTED NONE DETECTED   Barbiturates NONE DETECTED NONE DETECTED    Comment: (NOTE) DRUG SCREEN FOR MEDICAL PURPOSES ONLY.  IF CONFIRMATION IS NEEDED FOR ANY PURPOSE, NOTIFY LAB WITHIN 5 DAYS.  LOWEST DETECTABLE LIMITS FOR URINE DRUG SCREEN Drug Class                     Cutoff (ng/mL) Amphetamine and metabolites    1000 Barbiturate and metabolites    200 Benzodiazepine                 200 Opiates and metabolites        300 Cocaine and metabolites        300 THC                            50 Performed at [facility], [place] [address], [place], [place] [address]   POC CBG, ED     Status: Abnormal   Collection Time: 01/20/22  1:48 PM  Result Value Ref Range   Glucose-Capillary 344 (H) 70 - 99 mg/dL    Comment: Glucose reference range applies only to samples taken after fasting for at least 8 hours.   Comment 1 Notify RN    Comment 2 Document in Chart   Comprehensive metabolic panel     Status: Abnormal   Collection Time: 01/20/22  2:32 PM  Result  Value Ref Range   Sodium 136 135 - 145 mmol/L   Potassium 4.0 3.5 - 5.1 mmol/L   Chloride 103 98 - 111 mmol/L   CO2 24 22 - 32 mmol/L   Glucose, Bld 360 (H) 70 - 99 mg/dL    Comment: Glucose reference range applies only to samples taken after fasting for at least 8 hours.   BUN 12 8 - 23 mg/dL   Creatinine, Ser 1.01 0.61 - 1.24 mg/dL   Calcium 9.4 8.9 - 10.3 mg/dL   Total Protein 7.3 6.5 - 8.1 g/dL    Albumin 3.4 (L) 3.5 - 5.0 g/dL   AST 14 (L) 15 - 41 U/L   ALT 10 0 - 44 U/L   Alkaline Phosphatase 101 38 - 126 U/L   Total Bilirubin 0.3 0.3 - 1.2 mg/dL   GFR, Estimated >60 >60 mL/min    Comment: (NOTE) Calculated using the CKD-EPI Creatinine Equation (2021)    Anion gap 9 5 - 15    Comment: Performed at [facility] [address], [place], [place] [address]  Ethanol     Status: None   Collection Time: 01/20/22  2:32 PM  Result Value Ref Range   Alcohol, Ethyl (B) <10 <10 mg/dL    Comment: (NOTE) Lowest detectable limit for serum alcohol is 10 mg/dL.  For medical purposes only. Performed at [facility], [place] [address], [place], [place] [address]   CBC with Diff     Status: Abnormal   Collection Time: 01/20/22  2:32 PM  Result Value Ref Range   WBC 5.2 4.0 - 10.5 K/uL   RBC 4.73 4.22 - 5.81 MIL/uL   Hemoglobin 14.3 13.0 - 17.0 g/dL   HCT 43.1 39.0 - 52.0 %   MCV 91.1 80.0 - 100.0 fL   MCH 30.2 26.0 - 34.0 pg   MCHC 33.2 30.0 - 36.0 g/dL   RDW 14.1 11.5 - 15.5 %   Platelets 444 (H) 150 - 400 K/uL   nRBC 0.0 0.0 - 0.2 %   Neutrophils Relative % 61 %   Neutro Abs 3.2 1.7 - 7.7 K/uL   Lymphocytes Relative 29 %   Lymphs Abs 1.5 0.7 - 4.0 K/uL   Monocytes Relative 7 %   Monocytes Absolute 0.4 0.1 - 1.0 K/uL   Eosinophils Relative 2 %   Eosinophils Absolute 0.1 0.0 - 0.5 K/uL   Basophils Relative 1 %   Basophils Absolute 0.0 0.0 - 0.1 K/uL   Immature Granulocytes 0 %   Abs Immature Granulocytes 0.01 0.00 - 0.07 K/uL    Comment: Performed at [facility] [address] [place], [place] [address]  Salicylate level     Status: Abnormal   Collection Time: 01/20/22  2:32 PM  Result Value Ref Range   Salicylate Lvl <8.1 (L) 7.0 - 30.0 mg/dL    Comment: Performed at [facility] [address], [place],  [address]  Acetaminophen level     Status: Abnormal   Collection Time: 01/20/22  2:32 PM  Result Value Ref Range   Acetaminophen  (Tylenol), Serum <10 (L) 10 - 30 ug/mL    Comment: (NOTE) Therapeutic concentrations vary significantly. A range of 10-30 ug/mL  may be an effective concentration for many patients. However, some  are best treated at concentrations outside of this range. Acetaminophen concentrations >150 ug/mL at 4 hours after ingestion  and >50 ug/mL at 12 hours after ingestion are  often associated with  toxic reactions.  Performed at [facility], [place] [address], [place], [place] [address]   Resp Panel by RT-PCR (Flu A&B, Covid) Anterior Nasal Swab     Status: None   Collection Time: 01/20/22  7:36 PM   Specimen: Anterior Nasal Swab  Result Value Ref Range   SARS Coronavirus 2 by RT PCR NEGATIVE NEGATIVE    Comment: (NOTE) SARS-CoV-2 target nucleic acids are NOT DETECTED.  The SARS-CoV-2 RNA is generally detectable in upper respiratory specimens during the acute phase of infection. The lowest concentration of SARS-CoV-2 viral copies this assay can detect is 138 copies/mL. A negative result does not preclude SARS-Cov-2 infection and should not be used as the sole basis for treatment or other patient management decisions. A negative result may occur with  improper specimen collection/handling, submission of specimen other than nasopharyngeal swab, presence of viral mutation(s) within the areas targeted by this assay, and inadequate number of viral copies(<138 copies/mL). A negative result must be combined with clinical observations, patient history, and epidemiological information. The expected result is Negative.  Fact Sheet for Patients:  EntrepreneurPulse.com.au  Fact Sheet for Healthcare Providers:  IncredibleEmployment.be  This test is no t yet approved or cleared by the [place] FDA and  has been authorized for detection and/or diagnosis of SARS-CoV-2 by FDA under an Emergency Use Authorization (EUA). This EUA will remain  in effect  (meaning this test can be used) for the duration of the COVID-19 declaration under Section 564(b)(1) of the Act, 21 U.S.C.section 360bbb-3(b)(1), unless the authorization is terminated  or revoked sooner.       Influenza A by PCR NEGATIVE NEGATIVE   Influenza B by PCR NEGATIVE NEGATIVE    Comment: (NOTE) The Xpert Xpress SARS-CoV-2/FLU/RSV plus assay is intended as an aid in the diagnosis of influenza from Nasopharyngeal swab specimens and should not be used as a sole basis for treatment. Nasal washings and aspirates are unacceptable for Xpert Xpress SARS-CoV-2/FLU/RSV testing.  Fact Sheet for Patients: EntrepreneurPulse.com.au  Fact Sheet for Healthcare Providers: IncredibleEmployment.be  This test is not yet approved or cleared by the [place] FDA and has been authorized for detection and/or diagnosis of SARS-CoV-2 by FDA under an Emergency Use Authorization (EUA). This EUA will remain in effect (meaning this test can be used) for the duration of the COVID-19 declaration under Section 564(b)(1) of the Act, 21 U.S.C. section 360bbb-3(b)(1), unless the authorization is terminated or revoked.  Performed at [facility], [place] [address] [place], [place] [address]     Medications:  Current Facility-Administered Medications  Medication Dose Route Frequency Provider Last Rate Last Admin   acetaminophen (TYLENOL) tablet 650 mg  650 mg Oral Q4H PRN Fredia Sorrow, MD       amLODipine (NORVASC) tablet 10 mg  10 mg Oral Daily Fredia Sorrow, MD   10 mg at 01/20/22 1708   DULoxetine (CYMBALTA) DR capsule 60 mg  60 mg Oral Daily Fredia Sorrow, MD   60 mg at 01/20/22 1708   [START ON 01/21/2022] empagliflozin (JARDIANCE) tablet 10 mg  10 mg Oral QAC breakfast Fredia Sorrow, MD       gabapentin (NEURONTIN) capsule 600 mg  600 mg Oral TID Fredia Sorrow, MD   600 mg at 01/20/22 1708   [START ON 01/21/2022] glipiZIDE (GLUCOTROL XL)  24 hr tablet 10 mg  10 mg Oral Q breakfast Fredia Sorrow, MD       hydrOXYzine (ATARAX) tablet 25  mg  25 mg Oral QHS Fredia Sorrow, MD       metFORMIN (GLUCOPHAGE) tablet 1,000 mg  1,000 mg Oral BID Fredia Sorrow, MD       nicotine (NICODERM CQ - dosed in mg/24 hours) patch 21 mg  21 mg Transdermal Daily Fredia Sorrow, MD   21 mg at 01/20/22 1709   ondansetron (ZOFRAN) tablet 4 mg  4 mg Oral Q8H PRN Fredia Sorrow, MD       Current Outpatient Medications  Medication Sig Dispense Refill   Accu-Chek Softclix Lancets lancets Use up to 4 times daily as directed 100 each 0   amLODipine (NORVASC) 10 MG tablet Take 1 tablet (10 mg total) by mouth daily. (Patient not taking: Reported on 01/20/2022) 30 tablet 6   atorvastatin (LIPITOR) 40 MG tablet Take 1 tablet (40 mg total) by mouth daily. (Patient not taking: Reported on 01/20/2022) 30 tablet 6   Blood Glucose Monitoring Suppl (ACCU-CHEK GUIDE) w/Device KIT USE AS DIRECTED TO CHECK BLOOD SUGARS UP TO TWICE PER DAY 1 kit 0   DULoxetine (CYMBALTA) 60 MG capsule Take 1 capsule (60 mg total) by mouth daily. (Patient not taking: Reported on 01/20/2022) 30 capsule 6   empagliflozin (JARDIANCE) 10 MG TABS tablet Take 1 tablet (10 mg total) by mouth daily before breakfast. (Patient not taking: Reported on 01/20/2022) 30 tablet 6   gabapentin (NEURONTIN) 300 MG capsule Take 2 capsules (600 mg total) by mouth 3 (three) times daily. (Patient not taking: Reported on 01/20/2022) 180 capsule 6   glipiZIDE (GLUCOTROL XL) 10 MG 24 hr tablet Take 1 tablet (10 mg total) by mouth daily with breakfast. (Patient not taking: Reported on 01/20/2022) 30 tablet 6   glucose blood (ACCU-CHEK GUIDE) test strip Use up to 3 times daily as directed 100 each 3   hydrOXYzine (ATARAX) 25 MG tablet Take 1 tablet (25 mg total) by mouth at bedtime. (Patient not taking: Reported on 01/20/2022) 30 tablet 2   ibuprofen (ADVIL) 800 MG tablet Take 1 tablet (800 mg total) by mouth 3  (three) times daily. (Patient not taking: Reported on 01/20/2022) 21 tablet 0   Insulin Glargine Solostar (LANTUS) 100 UNIT/ML Solostar Pen Inject 24 Units into the skin daily. (Patient not taking: Reported on 01/20/2022) 15 mL 2   insulin lispro (HUMALOG) 100 UNIT/ML KwikPen Inject 5 Units into the skin 2 (two) times daily before lunch and supper. (Patient not taking: Reported on 01/20/2022) 6 mL 6   Insulin Pen Needle 31G X 8 MM MISC Use with insulin pen 100 each 1   meloxicam (MOBIC) 15 MG tablet Take 1 tablet (15 mg total) by mouth daily. (Patient not taking: Reported on 01/20/2022) 30 tablet 1   metFORMIN (GLUCOPHAGE) 1000 MG tablet Take 1 tablet (1,000 mg total) by mouth 2 (two) times daily. (Patient not taking: Reported on 01/20/2022) 60 tablet 6   methocarbamol (ROBAXIN) 500 MG tablet Take 1 tablet (500 mg total) by mouth 3 times/day as needed-between meals & bedtime for muscle spasms. (Patient not taking: Reported on 01/20/2022) 20 tablet 0   NEOMYCIN-POLYMYXIN-HYDROCORTISONE (CORTISPORIN) 1 % SOLN OTIC solution Place 3 drops into both ears 4 (four) times daily. (Patient not taking: Reported on 01/20/2022) 10 mL 0   tamsulosin (FLOMAX) 0.4 MG CAPS capsule Take 1 capsule (0.4 mg total) by mouth daily. For renal calculi (Patient not taking: Reported on 01/20/2022) 30 capsule 3    Musculoskeletal: pt moves all extremities and ambulates independently. Strength & Muscle Tone:  within normal limits Gait & Station: within normal limits Patient leans: N/A   Psychiatric Specialty Exam:  Presentation  General Appearance:  Fairly Groomed  Eye Contact: Fair  Speech: Clear and Coherent  Speech Volume: Normal  Handedness: Right   Mood and Affect  Mood: Depressed; Anxious  Affect: Congruent   Thought Process  Thought Processes: Coherent; Goal Directed  Descriptions of Associations:Intact  Orientation:Full (Time, Place and Person)  Thought Content:Logical  History of  Schizophrenia/Schizoaffective disorder:No  Duration of Psychotic Symptoms:No data recorded Hallucinations:Hallucinations: None  Ideas of Reference:None  Suicidal Thoughts:Suicidal Thoughts: Yes, Passive SI Passive Intent and/or Plan: Without Intent; Without Plan  Homicidal Thoughts:Homicidal Thoughts: No   Sensorium  Memory: Immediate Good; Recent Good; Remote Good  Judgment: -- (impulsive AEB illicit substance usage)  Insight: Lacking; Fair   Community education officer  Concentration: Fair  Attention Span: Fair  Recall: Roel Cluck of Knowledge: Fair  Language: Good   Psychomotor Activity  Psychomotor Activity:Psychomotor Activity: Normal   Assets  Assets: Communication Skills; Desire for Improvement   Sleep  Sleep:Sleep: Fair Number of Hours of Sleep: 5    Physical Exam: Physical Exam Vitals and nursing note reviewed.  Cardiovascular:     Rate and Rhythm: Normal rate.  Pulmonary:     Effort: Pulmonary effort is normal.  Musculoskeletal:        General: Normal range of motion.     Cervical back: Normal range of motion.  Neurological:     Mental Status: He is alert and oriented to person, place, and time.  Psychiatric:        Attention and Perception: Attention and perception normal.        Mood and Affect: Mood is anxious and depressed. Affect is blunt.        Speech: Speech normal.        Behavior: Behavior is cooperative.        Thought Content: Thought content includes suicidal (passive suicidal ideations, no plan or intent) ideation. Thought content does not include suicidal plan.        Cognition and Memory: Cognition and memory normal.        Judgment: Judgment is impulsive.    Review of Systems  Gastrointestinal: Negative.   Neurological: Negative.   Endo/Heme/Allergies: Negative.   Psychiatric/Behavioral:  Positive for depression, substance abuse and suicidal ideas (passive SI, no plan or intent for self harm). The patient is  nervous/anxious.    Blood pressure (!) 165/99, pulse 78, temperature 98.2 F (36.8 C), resp. rate 18, height _0  (1.727 m), SpO2 98 %. Body mass index is 25.85 kg/m.  Treatment Plan Summary: Pt with passive suicidal ideations, no plan or intent for self harm triggered by chronic homelessness and crack cocaine use.  Patient states he was given resources for residential treatment facilities but is requesting assistance with making contact.  He's concerned if he leaves the hospital he will be more inclined to resort back to substance use. Pt does not meet criteria for psychiatric admission but may be considered at [facility] who may be able to assist with transfer to longer term residential treatment facility.    As per above, patient does not meet criteria for involuntary psychiatric admissions and states he is not interested in voluntary admissions.  He is psych cleared.  We reviewed the importance of substance abuse abstinence; potential negative impact substance abuse can have on relationships and level of functioning.  Also reviewed the importance of medication compliance.   Pt's  home medications have already been restarted.   Disposition: Patient does not meet criteria for psychiatric inpatient admission.  Spoke with Ronelle Nigh, NP at [facility] who accepts patient for transfer pending negative covid test.    This service was provided via telemedicine using a 2-way, interactive audio and video technology.  Names of all persons participating in this telemedicine service and their role in this encounter. Name: [name] Role: Patient   Name: Merlyn Lot Role: [place]  Name: Hampton Abbot Role: Psychiatrist   Mallie Darting, NP 01/20/2022 10:27 PM

## 2022-01-20 NOTE — ED Provider Triage Note (Signed)
Emergency Medicine Provider Triage Evaluation Note  Kolten Ryback , a 61 y.o. male  was evaluated in triage.  Pt complains of for evaluation of SI without plan.  Patient states he is homeless and life is not worth living anymore.  He admits using crack cocaine.  He is not taking his meds at home.  He was actually discharged for hyperglycemia from the ED yesterday.  Review of Systems  Positive: Si without plan Negative:   Physical Exam  BP (!) 167/93   Pulse 78   Temp 98.2 F (36.8 C)   Resp 16   Ht '5\' 8"'$  (1.727 m)   SpO2 98%   BMI 25.85 kg/m  Gen:   Awake, no distress   Resp:  Normal effort  MSK:   Moves extremities without difficulty  Other:    Medical Decision Making  Medically screening exam initiated at 1:44 PM.  Appropriate orders placed.  [name] was informed that the remainder of the evaluation will be completed by another provider, this initial triage assessment does not replace that evaluation, and the importance of remaining in the ED until their evaluation is complete.  Si without plan   Ziona Wickens A, PA-C 01/20/22 1348

## 2022-01-20 NOTE — ED Notes (Signed)
Pt not answering to be triaged

## 2022-01-20 NOTE — ED Notes (Signed)
Pt completing TTS in room 36.

## 2022-01-21 ENCOUNTER — Other Ambulatory Visit (HOSPITAL_COMMUNITY)
Admission: EM | Admit: 2022-01-21 | Discharge: 2022-02-06 | Disposition: A | Discharge status: Other Acute Inpatient Hospital | Payer: Medicaid Other | Attending: Psychiatry | Admitting: Psychiatry

## 2022-01-21 DIAGNOSIS — M5126 Other intervertebral disc displacement, lumbar region: Secondary | ICD-10-CM

## 2022-01-21 DIAGNOSIS — Z720 Tobacco use: Secondary | ICD-10-CM | POA: Diagnosis not present

## 2022-01-21 DIAGNOSIS — N200 Calculus of kidney: Secondary | ICD-10-CM

## 2022-01-21 DIAGNOSIS — E1165 Type 2 diabetes mellitus with hyperglycemia: Secondary | ICD-10-CM | POA: Diagnosis present

## 2022-01-21 DIAGNOSIS — R45851 Suicidal ideations: Secondary | ICD-10-CM

## 2022-01-21 DIAGNOSIS — E1140 Type 2 diabetes mellitus with diabetic neuropathy, unspecified: Secondary | ICD-10-CM | POA: Insufficient documentation

## 2022-01-21 DIAGNOSIS — E785 Hyperlipidemia, unspecified: Secondary | ICD-10-CM | POA: Diagnosis not present

## 2022-01-21 DIAGNOSIS — E1142 Type 2 diabetes mellitus with diabetic polyneuropathy: Secondary | ICD-10-CM

## 2022-01-21 DIAGNOSIS — E1149 Type 2 diabetes mellitus with other diabetic neurological complication: Secondary | ICD-10-CM

## 2022-01-21 DIAGNOSIS — F1420 Cocaine dependence, uncomplicated: Secondary | ICD-10-CM

## 2022-01-21 DIAGNOSIS — I10 Essential (primary) hypertension: Secondary | ICD-10-CM | POA: Diagnosis not present

## 2022-01-21 DIAGNOSIS — D235 Other benign neoplasm of skin of trunk: Secondary | ICD-10-CM | POA: Diagnosis present

## 2022-01-21 LAB — HEMOGLOBIN A1C
Hgb A1c MFr Bld: 10.4 % — ABNORMAL HIGH (ref 4.8–5.6)
Mean Plasma Glucose: 251.78 mg/dL

## 2022-01-21 LAB — CBG MONITORING, ED
Glucose-Capillary: 243 mg/dL — ABNORMAL HIGH (ref 70–99)
Glucose-Capillary: 224 mg/dL — ABNORMAL HIGH (ref 70–99)
Glucose-Capillary: 282 mg/dL — ABNORMAL HIGH (ref 70–99)

## 2022-01-21 LAB — TSH: TSH: 0.248 u[IU]/mL — ABNORMAL LOW (ref 0.350–4.500)

## 2022-01-21 LAB — GLUCOSE, CAPILLARY
Glucose-Capillary: 374 mg/dL — ABNORMAL HIGH (ref 70–99)
Glucose-Capillary: 289 mg/dL — ABNORMAL HIGH (ref 70–99)

## 2022-01-21 MED ORDER — ATORVASTATIN CALCIUM 40 MG PO TABS
40.0000 mg | ORAL_TABLET | Freq: Every day | ORAL | Status: DC
  Administered 2022-01-21: 40 mg via ORAL
  Filled 2022-01-21: qty 1

## 2022-01-21 MED ORDER — EMPAGLIFLOZIN 10 MG PO TABS
10.0000 mg | ORAL_TABLET | Freq: Every day | ORAL | Status: DC
  Administered 2022-01-22 – 2022-02-06 (×16): 10 mg via ORAL
  Filled 2022-01-21 (×6): qty 1
  Filled 2022-01-21: qty 7
  Filled 2022-01-21 (×3): qty 1
  Filled 2022-01-21: qty 7
  Filled 2022-01-21 (×5): qty 1

## 2022-01-21 MED ORDER — INSULIN ASPART 100 UNIT/ML IJ SOLN
0.0000 [IU] | Freq: Three times a day (TID) | INTRAMUSCULAR | Status: DC
  Administered 2022-01-22: 1 [IU] via SUBCUTANEOUS
  Administered 2022-01-23: 3 [IU] via SUBCUTANEOUS
  Administered 2022-01-23 – 2022-01-24 (×3): 1 [IU] via SUBCUTANEOUS
  Administered 2022-01-24: 3 [IU] via SUBCUTANEOUS
  Administered 2022-01-25: 2 [IU] via SUBCUTANEOUS
  Administered 2022-01-25 – 2022-01-28 (×9): 1 [IU] via SUBCUTANEOUS
  Administered 2022-01-29: 2 [IU] via SUBCUTANEOUS
  Administered 2022-01-29 – 2022-01-30 (×2): 1 [IU] via SUBCUTANEOUS
  Administered 2022-01-30 (×2): 2 [IU] via SUBCUTANEOUS
  Administered 2022-01-31: 3 [IU] via SUBCUTANEOUS
  Administered 2022-01-31 – 2022-02-01 (×3): 1 [IU] via SUBCUTANEOUS
  Administered 2022-02-02: 2 [IU] via SUBCUTANEOUS
  Administered 2022-02-02: 3 [IU] via SUBCUTANEOUS
  Administered 2022-02-03: 1 [IU] via SUBCUTANEOUS
  Administered 2022-02-04: 2 [IU] via SUBCUTANEOUS
  Administered 2022-02-04: 1 [IU] via SUBCUTANEOUS
  Administered 2022-02-05: 3 [IU] via SUBCUTANEOUS
  Administered 2022-02-05: 1 [IU] via SUBCUTANEOUS

## 2022-01-21 MED ORDER — ALUM & MAG HYDROXIDE-SIMETH 200-200-20 MG/5ML PO SUSP: 30.0000 mL | ORAL | Status: DC | PRN

## 2022-01-21 MED ORDER — INSULIN ASPART 100 UNIT/ML IJ SOLN
5.0000 [IU] | Freq: Two times a day (BID) | INTRAMUSCULAR | Status: DC
  Administered 2022-01-22 – 2022-01-27 (×12): 5 [IU] via SUBCUTANEOUS
  Filled 2022-01-21: qty 0.05

## 2022-01-21 MED ORDER — METHOCARBAMOL 500 MG PO TABS: 500.0000 mg | ORAL_TABLET | Freq: Two times a day (BID) | ORAL | Status: DC | PRN

## 2022-01-21 MED ORDER — INSULIN GLARGINE 100 UNIT/ML ~~LOC~~ SOLN
24.0000 [IU] | Freq: Every day | SUBCUTANEOUS | Status: DC
  Administered 2022-01-21: 24 [IU] via SUBCUTANEOUS
  Filled 2022-01-21 (×2): qty 0.24

## 2022-01-21 MED ORDER — ACETAMINOPHEN 325 MG PO TABS
650.0000 mg | ORAL_TABLET | Freq: Four times a day (QID) | ORAL | Status: DC | PRN
  Administered 2022-01-25 – 2022-02-03 (×8): 650 mg via ORAL
  Filled 2022-01-21 (×8): qty 2

## 2022-01-21 MED ORDER — NICOTINE POLACRILEX 2 MG MT GUM: 4.0000 mg | CHEWING_GUM | OROMUCOSAL | Status: DC | PRN

## 2022-01-21 MED ORDER — METFORMIN HCL 500 MG PO TABS
1000.0000 mg | ORAL_TABLET | Freq: Two times a day (BID) | ORAL | Status: DC
  Administered 2022-01-21 – 2022-02-06 (×32): 1000 mg via ORAL
  Filled 2022-01-21 (×3): qty 2
  Filled 2022-01-21: qty 28
  Filled 2022-01-21 (×9): qty 2
  Filled 2022-01-21: qty 28
  Filled 2022-01-21: qty 2
  Filled 2022-01-21: qty 28
  Filled 2022-01-21: qty 2
  Filled 2022-01-21: qty 28
  Filled 2022-01-21 (×5): qty 2
  Filled 2022-01-21: qty 28
  Filled 2022-01-21: qty 2
  Filled 2022-01-21: qty 28
  Filled 2022-01-21 (×8): qty 2
  Filled 2022-01-21: qty 28
  Filled 2022-01-21: qty 2
  Filled 2022-01-21: qty 28
  Filled 2022-01-21 (×3): qty 2
  Filled 2022-01-21 (×2): qty 28

## 2022-01-21 MED ORDER — GLIPIZIDE ER 5 MG PO TB24
10.0000 mg | ORAL_TABLET | Freq: Every day | ORAL | Status: DC
  Administered 2022-01-22 – 2022-02-06 (×16): 10 mg via ORAL
  Filled 2022-01-21 (×3): qty 2
  Filled 2022-01-21: qty 14
  Filled 2022-01-21: qty 4
  Filled 2022-01-21: qty 2
  Filled 2022-01-21: qty 14
  Filled 2022-01-21 (×3): qty 2
  Filled 2022-01-21: qty 4
  Filled 2022-01-21 (×2): qty 2
  Filled 2022-01-21: qty 14
  Filled 2022-01-21 (×3): qty 2

## 2022-01-21 MED ORDER — GABAPENTIN 300 MG PO CAPS
600.0000 mg | ORAL_CAPSULE | Freq: Three times a day (TID) | ORAL | Status: DC
  Administered 2022-01-21 – 2022-01-31 (×30): 600 mg via ORAL
  Filled 2022-01-21 (×5): qty 2
  Filled 2022-01-21 (×2): qty 42
  Filled 2022-01-21: qty 2
  Filled 2022-01-21: qty 42
  Filled 2022-01-21 (×3): qty 2
  Filled 2022-01-21: qty 42
  Filled 2022-01-21 (×2): qty 2
  Filled 2022-01-21: qty 42
  Filled 2022-01-21 (×5): qty 2
  Filled 2022-01-21: qty 42
  Filled 2022-01-21 (×2): qty 2
  Filled 2022-01-21: qty 42
  Filled 2022-01-21: qty 2
  Filled 2022-01-21 (×2): qty 42
  Filled 2022-01-21 (×2): qty 2
  Filled 2022-01-21: qty 42
  Filled 2022-01-21 (×4): qty 2
  Filled 2022-01-21: qty 42
  Filled 2022-01-21 (×2): qty 2
  Filled 2022-01-21 (×4): qty 42
  Filled 2022-01-21 (×3): qty 2

## 2022-01-21 MED ORDER — MAGNESIUM HYDROXIDE 400 MG/5ML PO SUSP: 30.0000 mL | Freq: Every day | ORAL | Status: DC | PRN

## 2022-01-21 MED ORDER — ATORVASTATIN CALCIUM 40 MG PO TABS
40.0000 mg | ORAL_TABLET | Freq: Every day | ORAL | Status: DC
  Administered 2022-01-22 – 2022-02-06 (×16): 40 mg via ORAL
  Filled 2022-01-21: qty 1
  Filled 2022-01-21: qty 7
  Filled 2022-01-21 (×2): qty 1
  Filled 2022-01-21: qty 7
  Filled 2022-01-21 (×3): qty 1
  Filled 2022-01-21: qty 7
  Filled 2022-01-21 (×3): qty 1
  Filled 2022-01-21: qty 7
  Filled 2022-01-21 (×7): qty 1
  Filled 2022-01-21: qty 7

## 2022-01-21 MED ORDER — TAMSULOSIN HCL 0.4 MG PO CAPS
0.4000 mg | ORAL_CAPSULE | Freq: Every day | ORAL | Status: DC
  Administered 2022-01-21 – 2022-01-30 (×10): 0.4 mg via ORAL
  Filled 2022-01-21 (×2): qty 1
  Filled 2022-01-21: qty 7
  Filled 2022-01-21 (×2): qty 1
  Filled 2022-01-21: qty 7
  Filled 2022-01-21 (×4): qty 1
  Filled 2022-01-21: qty 7
  Filled 2022-01-21: qty 1
  Filled 2022-01-21: qty 7
  Filled 2022-01-21: qty 1
  Filled 2022-01-21: qty 7

## 2022-01-21 MED ORDER — AMLODIPINE BESYLATE 10 MG PO TABS
10.0000 mg | ORAL_TABLET | Freq: Every day | ORAL | Status: DC
  Administered 2022-01-21 – 2022-02-06 (×17): 10 mg via ORAL
  Filled 2022-01-21 (×3): qty 1
  Filled 2022-01-21: qty 7
  Filled 2022-01-21 (×6): qty 1
  Filled 2022-01-21: qty 7
  Filled 2022-01-21 (×3): qty 1
  Filled 2022-01-21: qty 7
  Filled 2022-01-21 (×4): qty 1
  Filled 2022-01-21: qty 7
  Filled 2022-01-21: qty 1
  Filled 2022-01-21: qty 7

## 2022-01-21 MED ORDER — NICOTINE 21 MG/24HR TD PT24
21.0000 mg | MEDICATED_PATCH | Freq: Every day | TRANSDERMAL | Status: DC
  Administered 2022-01-22 – 2022-02-06 (×15): 21 mg via TRANSDERMAL
  Filled 2022-01-21 (×8): qty 1
  Filled 2022-01-21 (×2): qty 7
  Filled 2022-01-21: qty 1
  Filled 2022-01-21: qty 7
  Filled 2022-01-21 (×2): qty 1
  Filled 2022-01-21 (×2): qty 7
  Filled 2022-01-21 (×5): qty 1

## 2022-01-21 MED ORDER — TAMSULOSIN HCL 0.4 MG PO CAPS
0.4000 mg | ORAL_CAPSULE | Freq: Every day | ORAL | Status: DC
  Administered 2022-01-21: 0.4 mg via ORAL
  Filled 2022-01-21: qty 1

## 2022-01-21 MED ORDER — INSULIN GLARGINE 100 UNIT/ML ~~LOC~~ SOLN
24.0000 [IU] | Freq: Every day | SUBCUTANEOUS | Status: DC
  Administered 2022-01-21: 24 [IU] via SUBCUTANEOUS
  Filled 2022-01-21: qty 0.24

## 2022-01-21 MED ORDER — INSULIN ASPART 100 UNIT/ML IJ SOLN
5.0000 [IU] | Freq: Two times a day (BID) | INTRAMUSCULAR | Status: DC
  Administered 2022-01-21: 5 [IU] via SUBCUTANEOUS

## 2022-01-21 MED ORDER — DULOXETINE HCL 60 MG PO CPEP
60.0000 mg | ORAL_CAPSULE | Freq: Every day | ORAL | Status: DC
  Administered 2022-01-21 – 2022-02-06 (×17): 60 mg via ORAL
  Filled 2022-01-21: qty 1
  Filled 2022-01-21: qty 7
  Filled 2022-01-21 (×10): qty 1
  Filled 2022-01-21: qty 7
  Filled 2022-01-21: qty 1
  Filled 2022-01-21: qty 7
  Filled 2022-01-21: qty 1
  Filled 2022-01-21 (×2): qty 7
  Filled 2022-01-21 (×4): qty 1

## 2022-01-21 MED ORDER — INSULIN ASPART 100 UNIT/ML IJ SOLN
6.0000 [IU] | INTRAMUSCULAR | Status: AC
  Administered 2022-01-21: 6 [IU] via SUBCUTANEOUS

## 2022-01-21 NOTE — ED Notes (Signed)
Patient admitted to [facility] from [facility].  He

## 2022-01-21 NOTE — Progress Notes (Signed)
Patient admitted to [facility] form [facility] for detox from cocaine use.  Patient dysphoric with constricted affect however he has denied avh shi or plan.  Patient has unsteady gait from what he reports as surgery several years ago.  He says it might also be arthritis or sciatica. Patient appears disheveled and is malodorous.  Patient was IVC'd at [facility] however it was rescinded once he arrived here.  Patient was calm and cooperative with admission process.  He was oriented to unit and shown to his room.  He is now eating a snack.  Will monitor and provide safe environment for him.

## 2022-01-21 NOTE — ED Notes (Signed)
Called [employer] to escort patient to [facility].

## 2022-01-21 NOTE — Progress Notes (Signed)
[facility] Psych ED Progress Note  01/21/2022 10:22 AM David Wyatt  MRN:  [id number]   Subjective:   Patient seen this morning in his room for face-to-face reevaluation.  Patient tells me about his struggles with cocaine, and has been using almost daily for a few years now.  Patient reiterates how he has lost a lot of family members recently and has been feeling very depressed,hopeless, and isolated.  Patient stated he does not want to die, and he knows his substance abuse is risky for his life.  He tells me he is motivated to remain sober.  He knows he needs to be sober to reunite with other family members who are still around, and for shelters.  He was previously staying at [facility] but did his laundry past curfew and was asked to leave.  He tells me he has spoken with them and he can return after he is detoxed.  Today he denies any suicidal ideations or homicidal ideations.  He denies any auditory visual hallucinations.  He denies any other illicit substances.  Denies alcohol use. His sleep and appetite have improved.  I spoke with patient about possible [facility] admission.  He is willing and agreeable to this treatment plan.  Will reach out to [facility] for possible admission/bed availability.   Principal Problem: Passive suicidal ideations Diagnosis:  Principal Problem:   Passive suicidal ideations Active Problems:   Crack cocaine use   ED Assessment Time Calculation: Start Time: 0900 Stop Time: 0925 Total Time in Minutes (Assessment Completion): Marshall Scale:  [place] ED from 01/20/2022 in [place] ED from 01/18/2022 in [place] ED from 01/08/2022 in [place] Risk No Risk No Risk       Past Medical History:  Past Medical History:  Diagnosis Date   Angioedema of lips 07/28/2012   left upper (07/29/2012)   Arthritis    hands  and back   Chronic back pain    Cocaine abuse ([place])    Diabetic neuropathy (HCC)    High cholesterol    Hypertension    Neuropathy    Type II diabetes mellitus ([place])     Past Surgical History:  Procedure Laterality Date   BACK SURGERY     CYSTOSCOPY W/ URETERAL STENT PLACEMENT Right 02/23/2021   Procedure: CYSTOSCOPY WITH RETROGRADE PYELOGRAM/URETERAL STENT PLACEMENT;  Surgeon: Janith Lima, MD;  Location: [facility];  Service: Urology;  Laterality: Right;   HERNIA REPAIR Right 04/01/2012   I & D EXTREMITY Left 12/13/2012   Procedure: IRRIGATION AND DEBRIDEMENT LEFT THUMB;  Surgeon: Tennis Must, MD;  Location: [place];  Service: Orthopedics;  Laterality: Left;   INCISION AND DRAINAGE OF WOUND     boil on back/notes 07/14/2008 (07/29/2012)   INGUINAL HERNIA REPAIR  04/01/2012   Procedure: HERNIA REPAIR INGUINAL ADULT;  Surgeon: Haywood Lasso, MD;  Location: [place];  Service: General;  Laterality: Right;   INGUINAL HERNIA REPAIR Left 09/06/2016   Procedure: OPEN REPAIR LEFT INGUINAL HERNIA;  Surgeon: Greer Pickerel, MD;  Location: [place];  Service: General;  Laterality: Left;   INSERTION OF MESH Left 09/06/2016   Procedure: INSERTION OF MESH;  Surgeon: Greer Pickerel, MD;  Location: [place];  Service: General;  Laterality: Left;   TONSILLECTOMY     Family History:  Family History  Problem Relation Age of Onset   Diabetes Mother  Kidney disease Mother    Hyperlipidemia Mother    Hyperlipidemia Father    Diabetes Father     Social History:  Social History   Substance and Sexual Activity  Alcohol Use No   Alcohol/week: 0.0 standard drinks of alcohol     Social History   Substance and Sexual Activity  Drug Use Not Currently   Types: "Crack" cocaine    Social History   Socioeconomic History   Marital status: Divorced    Spouse name: Not on file   Number of children: 3   Years of education: 12   Highest education level: Not on file  Occupational History    Comment: disabled   Tobacco Use   Smoking status: Every Day    Packs/day: 0.25    Years: 30.00    Total pack years: 7.50    Types: Cigarettes   Smokeless tobacco: Former    Quit date: 08/15/2015   Tobacco comments:    04/29/16 2  cigs daily, 10/27/17 sometimes < .25 PPD  Vaping Use   Vaping Use: Never used  Substance and Sexual Activity   Alcohol use: No    Alcohol/week: 0.0 standard drinks of alcohol   Drug use: Not Currently    Types: "Crack" cocaine   Sexual activity: Not on file  Other Topics Concern   Not on file  Social History Narrative   04/29/17   Lives in shelter   Caffeine- a lot of  tea, coffee   Social Determinants of Health   Financial Resource Strain: Not on file  Food Insecurity: Not on file  Transportation Needs: Not on file  Physical Activity: Not on file  Stress: Not on file  Social Connections: Not on file    Sleep: Fair  Appetite:  Fair  Current Medications: Current Facility-Administered Medications  Medication Dose Route Frequency Provider Last Rate Last Admin   acetaminophen (TYLENOL) tablet 650 mg  650 mg Oral Q4H PRN Fredia Sorrow, MD   650 mg at 01/20/22 2256   amLODipine (NORVASC) tablet 10 mg  10 mg Oral Daily Fredia Sorrow, MD   10 mg at 01/21/22 0923   atorvastatin (LIPITOR) tablet 40 mg  40 mg Oral Daily Palumbo, April, MD   40 mg at 01/21/22 2035   DULoxetine (CYMBALTA) DR capsule 60 mg  60 mg Oral Daily Fredia Sorrow, MD   60 mg at 01/21/22 5974   empagliflozin (JARDIANCE) tablet 10 mg  10 mg Oral QAC breakfast Fredia Sorrow, MD   10 mg at 01/21/22 1638   gabapentin (NEURONTIN) capsule 600 mg  600 mg Oral TID Fredia Sorrow, MD   600 mg at 01/21/22 4536   glipiZIDE (GLUCOTROL XL) 24 hr tablet 10 mg  10 mg Oral Q breakfast Fredia Sorrow, MD   10 mg at 01/21/22 4680   hydrOXYzine (ATARAX) tablet 25 mg  25 mg Oral QHS Fredia Sorrow, MD   25 mg at 01/20/22 2254   insulin aspart (novoLOG) injection 5 Units  5 Units Subcutaneous BID AC Palumbo,  April, MD       insulin glargine (LANTUS) injection 24 Units  24 Units Subcutaneous Daily Palumbo, April, MD   24 Units at 01/21/22 0924   metFORMIN (GLUCOPHAGE) tablet 1,000 mg  1,000 mg Oral BID Fredia Sorrow, MD   1,000 mg at 01/21/22 0923   methocarbamol (ROBAXIN) tablet 500 mg  500 mg Oral BID BM & HS PRN Palumbo, April, MD       nicotine (NICODERM CQ -  dosed in mg/24 hours) patch 21 mg  21 mg Transdermal Daily Fredia Sorrow, MD   21 mg at 01/21/22 0943   ondansetron (ZOFRAN) tablet 4 mg  4 mg Oral Q8H PRN Fredia Sorrow, MD       tamsulosin [facility]) capsule 0.4 mg  0.4 mg Oral Daily Palumbo, April, MD   0.4 mg at 01/21/22 6195   Current Outpatient Medications  Medication Sig Dispense Refill   Accu-Chek Softclix Lancets lancets Use up to 4 times daily as directed 100 each 0   amLODipine (NORVASC) 10 MG tablet Take 1 tablet (10 mg total) by mouth daily. (Patient not taking: Reported on 01/20/2022) 30 tablet 6   atorvastatin (LIPITOR) 40 MG tablet Take 1 tablet (40 mg total) by mouth daily. (Patient not taking: Reported on 01/20/2022) 30 tablet 6   Blood Glucose Monitoring Suppl (ACCU-CHEK GUIDE) w/Device KIT USE AS DIRECTED TO CHECK BLOOD SUGARS UP TO TWICE PER DAY 1 kit 0   DULoxetine (CYMBALTA) 60 MG capsule Take 1 capsule (60 mg total) by mouth daily. (Patient not taking: Reported on 01/20/2022) 30 capsule 6   empagliflozin (JARDIANCE) 10 MG TABS tablet Take 1 tablet (10 mg total) by mouth daily before breakfast. (Patient not taking: Reported on 01/20/2022) 30 tablet 6   gabapentin (NEURONTIN) 300 MG capsule Take 2 capsules (600 mg total) by mouth 3 (three) times daily. (Patient not taking: Reported on 01/20/2022) 180 capsule 6   glipiZIDE (GLUCOTROL XL) 10 MG 24 hr tablet Take 1 tablet (10 mg total) by mouth daily with breakfast. (Patient not taking: Reported on 01/20/2022) 30 tablet 6   glucose blood (ACCU-CHEK GUIDE) test strip Use up to 3 times daily as directed 100 each 3    hydrOXYzine (ATARAX) 25 MG tablet Take 1 tablet (25 mg total) by mouth at bedtime. (Patient not taking: Reported on 01/20/2022) 30 tablet 2   ibuprofen (ADVIL) 800 MG tablet Take 1 tablet (800 mg total) by mouth 3 (three) times daily. (Patient not taking: Reported on 01/20/2022) 21 tablet 0   Insulin Glargine Solostar (LANTUS) 100 UNIT/ML Solostar Pen Inject 24 Units into the skin daily. (Patient not taking: Reported on 01/20/2022) 15 mL 2   insulin lispro (HUMALOG) 100 UNIT/ML KwikPen Inject 5 Units into the skin 2 (two) times daily before lunch and supper. (Patient not taking: Reported on 01/20/2022) 6 mL 6   Insulin Pen Needle 31G X 8 MM MISC Use with insulin pen 100 each 1   meloxicam (MOBIC) 15 MG tablet Take 1 tablet (15 mg total) by mouth daily. (Patient not taking: Reported on 01/20/2022) 30 tablet 1   metFORMIN (GLUCOPHAGE) 1000 MG tablet Take 1 tablet (1,000 mg total) by mouth 2 (two) times daily. (Patient not taking: Reported on 01/20/2022) 60 tablet 6   methocarbamol (ROBAXIN) 500 MG tablet Take 1 tablet (500 mg total) by mouth 3 times/day as needed-between meals & bedtime for muscle spasms. (Patient not taking: Reported on 01/20/2022) 20 tablet 0   NEOMYCIN-POLYMYXIN-HYDROCORTISONE (CORTISPORIN) 1 % SOLN OTIC solution Place 3 drops into both ears 4 (four) times daily. (Patient not taking: Reported on 01/20/2022) 10 mL 0   tamsulosin (FLOMAX) 0.4 MG CAPS capsule Take 1 capsule (0.4 mg total) by mouth daily. For renal calculi (Patient not taking: Reported on 01/20/2022) 30 capsule 3    Lab Results:  Results for orders placed or performed during the hospital encounter of 01/20/22 (from the past 48 hour(s))  Urine rapid drug screen (hosp performed)  Status: Abnormal   Collection Time: 01/20/22  1:45 PM  Result Value Ref Range   Opiates NONE DETECTED NONE DETECTED   Cocaine POSITIVE (A) NONE DETECTED   Benzodiazepines NONE DETECTED NONE DETECTED   Amphetamines NONE DETECTED NONE  DETECTED   Tetrahydrocannabinol NONE DETECTED NONE DETECTED   Barbiturates NONE DETECTED NONE DETECTED    Comment: (NOTE) DRUG SCREEN FOR MEDICAL PURPOSES ONLY.  IF CONFIRMATION IS NEEDED FOR ANY PURPOSE, NOTIFY LAB WITHIN 5 DAYS.  LOWEST DETECTABLE LIMITS FOR URINE DRUG SCREEN Drug Class                     Cutoff (ng/mL) Amphetamine and metabolites    1000 Barbiturate and metabolites    200 Benzodiazepine                 200 Opiates and metabolites        300 Cocaine and metabolites        300 THC                            50 Performed at [facility], [place] [address], [place], [place] [address]   POC CBG, ED     Status: Abnormal   Collection Time: 01/20/22  1:48 PM  Result Value Ref Range   Glucose-Capillary 344 (H) 70 - 99 mg/dL    Comment: Glucose reference range applies only to samples taken after fasting for at least 8 hours.   Comment 1 Notify RN    Comment 2 Document in Chart   Comprehensive metabolic panel     Status: Abnormal   Collection Time: 01/20/22  2:32 PM  Result Value Ref Range   Sodium 136 135 - 145 mmol/L   Potassium 4.0 3.5 - 5.1 mmol/L   Chloride 103 98 - 111 mmol/L   CO2 24 22 - 32 mmol/L   Glucose, Bld 360 (H) 70 - 99 mg/dL    Comment: Glucose reference range applies only to samples taken after fasting for at least 8 hours.   BUN 12 8 - 23 mg/dL   Creatinine, Ser 1.01 0.61 - 1.24 mg/dL   Calcium 9.4 8.9 - 10.3 mg/dL   Total Protein 7.3 6.5 - 8.1 g/dL   Albumin 3.4 (L) 3.5 - 5.0 g/dL   AST 14 (L) 15 - 41 U/L   ALT 10 0 - 44 U/L   Alkaline Phosphatase 101 38 - 126 U/L   Total Bilirubin 0.3 0.3 - 1.2 mg/dL   GFR, Estimated >60 >60 mL/min    Comment: (NOTE) Calculated using the CKD-EPI Creatinine Equation (2021)    Anion gap 9 5 - 15    Comment: Performed at [facility] [address], [place],  [address]  Ethanol     Status: None   Collection Time: 01/20/22  2:32 PM  Result Value Ref Range   Alcohol, Ethyl (B) <10 <10  mg/dL    Comment: (NOTE) Lowest detectable limit for serum alcohol is 10 mg/dL.  For medical purposes only. Performed at [facility], [place] [address], [place],  [address]   CBC with Diff     Status: Abnormal   Collection Time: 01/20/22  2:32 PM  Result Value Ref Range   WBC 5.2 4.0 - 10.5 K/uL   RBC 4.73 4.22 - 5.81 MIL/uL   Hemoglobin 14.3 13.0 - 17.0 g/dL   HCT 43.1 39.0 -  52.0 %   MCV 91.1 80.0 - 100.0 fL   MCH 30.2 26.0 - 34.0 pg   MCHC 33.2 30.0 - 36.0 g/dL   RDW 14.1 11.5 - 15.5 %   Platelets 444 (H) 150 - 400 K/uL   nRBC 0.0 0.0 - 0.2 %   Neutrophils Relative % 61 %   Neutro Abs 3.2 1.7 - 7.7 K/uL   Lymphocytes Relative 29 %   Lymphs Abs 1.5 0.7 - 4.0 K/uL   Monocytes Relative 7 %   Monocytes Absolute 0.4 0.1 - 1.0 K/uL   Eosinophils Relative 2 %   Eosinophils Absolute 0.1 0.0 - 0.5 K/uL   Basophils Relative 1 %   Basophils Absolute 0.0 0.0 - 0.1 K/uL   Immature Granulocytes 0 %   Abs Immature Granulocytes 0.01 0.00 - 0.07 K/uL    Comment: Performed at [facility] [address] [place], [place] [address]  Salicylate level     Status: Abnormal   Collection Time: 01/20/22  2:32 PM  Result Value Ref Range   Salicylate Lvl <2.7 (L) 7.0 - 30.0 mg/dL    Comment: Performed at [facility] [address], [place], [place] [address]  Acetaminophen level     Status: Abnormal   Collection Time: 01/20/22  2:32 PM  Result Value Ref Range   Acetaminophen (Tylenol), Serum <10 (L) 10 - 30 ug/mL    Comment: (NOTE) Therapeutic concentrations vary significantly. A range of 10-30 ug/mL  may be an effective concentration for many patients. However, some  are best treated at concentrations outside of this range. Acetaminophen concentrations >150 ug/mL at 4 hours after ingestion  and >50 ug/mL at 12 hours after ingestion are often associated with  toxic reactions.  Performed at [facility], [place] [address] [place], [place] [address]   Resp  Panel by RT-PCR (Flu A&B, Covid) Anterior Nasal Swab     Status: None   Collection Time: 01/20/22  7:36 PM   Specimen: Anterior Nasal Swab  Result Value Ref Range   SARS Coronavirus 2 by RT PCR NEGATIVE NEGATIVE    Comment: (NOTE) SARS-CoV-2 target nucleic acids are NOT DETECTED.  The SARS-CoV-2 RNA is generally detectable in upper respiratory specimens during the acute phase of infection. The lowest concentration of SARS-CoV-2 viral copies this assay can detect is 138 copies/mL. A negative result does not preclude SARS-Cov-2 infection and should not be used as the sole basis for treatment or other patient management decisions. A negative result may occur with  improper specimen collection/handling, submission of specimen other than nasopharyngeal swab, presence of viral mutation(s) within the areas targeted by this assay, and inadequate number of viral copies(<138 copies/mL). A negative result must be combined with clinical observations, patient history, and epidemiological information. The expected result is Negative.  Fact Sheet for Patients:  EntrepreneurPulse.com.au  Fact Sheet for Healthcare Providers:  IncredibleEmployment.be  This test is no t yet approved or cleared by the [place] FDA and  has been authorized for detection and/or diagnosis of SARS-CoV-2 by FDA under an Emergency Use Authorization (EUA). This EUA will remain  in effect (meaning this test can be used) for the duration of the COVID-19 declaration under Section 564(b)(1) of the Act, 21 U.S.C.section 360bbb-3(b)(1), unless the authorization is terminated  or revoked sooner.       Influenza A by PCR NEGATIVE NEGATIVE   Influenza B by PCR NEGATIVE NEGATIVE    Comment: (NOTE) The Xpert Xpress  SARS-CoV-2/FLU/RSV plus assay is intended as an aid in the diagnosis of influenza from Nasopharyngeal swab specimens and should not be used as a sole basis for treatment. Nasal  washings and aspirates are unacceptable for Xpert Xpress SARS-CoV-2/FLU/RSV testing.  Fact Sheet for Patients: EntrepreneurPulse.com.au  Fact Sheet for Healthcare Providers: IncredibleEmployment.be  This test is not yet approved or cleared by the [place] FDA and has been authorized for detection and/or diagnosis of SARS-CoV-2 by FDA under an Emergency Use Authorization (EUA). This EUA will remain in effect (meaning this test can be used) for the duration of the COVID-19 declaration under Section 564(b)(1) of the Act, 21 U.S.C. section 360bbb-3(b)(1), unless the authorization is terminated or revoked.  Performed at [facility], [place] [address], [place], [place] [address]   CBG monitoring, ED     Status: Abnormal   Collection Time: 01/20/22 10:58 PM  Result Value Ref Range   Glucose-Capillary 479 (H) 70 - 99 mg/dL    Comment: Glucose reference range applies only to samples taken after fasting for at least 8 hours.  CBG monitoring, ED     Status: Abnormal   Collection Time: 01/21/22  2:42 AM  Result Value Ref Range   Glucose-Capillary 243 (H) 70 - 99 mg/dL    Comment: Glucose reference range applies only to samples taken after fasting for at least 8 hours.  CBG monitoring, ED     Status: Abnormal   Collection Time: 01/21/22  7:52 AM  Result Value Ref Range   Glucose-Capillary 224 (H) 70 - 99 mg/dL    Comment: Glucose reference range applies only to samples taken after fasting for at least 8 hours.    Blood Alcohol level:  Lab Results  Component Value Date   [facility] <10 01/20/2022   ETH <10 02/17/2021   Psychiatric Specialty Exam:  Presentation  General Appearance:  Appropriate for Environment  Eye Contact: Fair  Speech: Clear and Coherent  Speech Volume: Normal  Handedness: Right   Mood and Affect  Mood: Hopeless; Depressed  Affect: Congruent   Thought Process  Thought Processes: Coherent  Descriptions  of Associations:Intact  Orientation:Full (Time, Place and Person)  Thought Content:Logical  History of Schizophrenia/Schizoaffective disorder:No  Duration of Psychotic Symptoms:No data recorded Hallucinations:Hallucinations: None  Ideas of Reference:None  Suicidal Thoughts:Suicidal Thoughts: No SI Passive Intent and/or Plan: Without Intent; Without Plan  Homicidal Thoughts:Homicidal Thoughts: No   Sensorium  Memory: Immediate Good; Recent Good; Remote Good  Judgment: Fair  Insight: Fair   Community education officer  Concentration: Good  Attention Span: Good  Recall: Good  Fund of Knowledge: Good  Language: Good   Psychomotor Activity  Psychomotor Activity: Psychomotor Activity: Normal   Assets  Assets: Communication Skills; Desire for Improvement; Leisure Time   Sleep  Sleep: Sleep: Fair Number of Hours of Sleep: 5    Physical Exam: Physical Exam Neurological:     Mental Status: He is alert and oriented to person, place, and time.  Psychiatric:        Attention and Perception: Attention normal.        Mood and Affect: Mood is depressed.        Speech: Speech normal.        Behavior: Behavior is cooperative.        Thought Content: Thought content normal.        Cognition and Memory: Cognition normal.    Review of Systems  Psychiatric/Behavioral:  Positive for depression and substance abuse.   All  other systems reviewed and are negative.  Blood pressure (!) 146/112, pulse 87, temperature 98 F (36.7 C), temperature source Oral, resp. rate 17, height _0  (1.727 m), SpO2 96 %. Body mass index is 25.85 kg/m.   Medical Decision Making: Pt case reviewed and discussed with Dr. Dwyane Dee. Pt does not meet criteria for IVC or inpatient psychiatric admission. Pt does qualify for possible [facility] admission. Will reach out to [facility] team for bed availability. Will update ED team as soon as possible.   - continue current medications  Vesta Mixer,  NP 01/21/2022, 10:22 AM

## 2022-01-21 NOTE — ED Notes (Signed)
Pt sleeping in room , easily aroused. A/O x 4. Denies Si/HI/AVH. Denies s/s of withdrawal. No noted resp distress. Will continue to monitor for safety

## 2022-01-21 NOTE — ED Notes (Signed)
Review of IVC paperwork in [facility] indicates patient was IVC'd on 01/20/22 with an expiration of 01/27/22.

## 2022-01-21 NOTE — ED Notes (Signed)
IVC docs taken to yellow/purple zone.

## 2022-01-21 NOTE — ED Notes (Signed)
Called to cancel [employer] escort due to patient being unaccepted at [facility].

## 2022-01-21 NOTE — ED Provider Notes (Signed)
Facility Based Crisis Admission H&P  Date: 01/21/22 Patient Name: David Wyatt Age: 61 y.o.  DOB: May 15, 1960  MRN: [id number]  Chief Complaint:  No chief complaint on file.    Diagnoses:  Final diagnoses:  None    HPI:  David Wyatt is a 61 y.o. male with PMH cocaine use disorder, tobacco use disorder, housing instability, DM2 + neuropathy, HTN, HLD, no inpatient psych admission, no suicide attempts, who initially presented to [facility] (01/20/2022) for passive SI and assistance with rehab for crack cocaine use.  Patient was then IVC by EDP, which was rescinded on 01/21/2022 by Jearld Fenton, MD.  He was then admitted Voluntary to [facility] (01/21/2022) via EMS for assistance with residential placement.     Patient Narrative:  Patient stated that he presented to [facility] for assistance with quitting cocaine and tobacco use.  Stated that he has been using cocaine daily for the past 3-4 years.  Prior to that patient stated his longest bout of sobriety was greater than 1 year after stay at [facility].  Reported that he smokes crack cocaine, denied IVDU.  Denied IVDU ever.  However patient was amenable to high risk workup, gave verbal consent for HIV, RPR, hepatitis panel.  Stated that cocaine does not help with anything, he does not know why he continues to use it.  Denied using it to help self-medicate for anxiety or depression.  Stated he is ready to quit tobacco as well.  Stated he smokes about 1 PPD for greater than 10 years.  He was amenable to starting medication to help with smoking cessation.  Stated that he has been homeless for years and that he is tired of it.  Stated that his goal is to find a place of his own.  Reported that he is currently on disabilities, getting about $900 a month.  [place]iel he is on disability for his back.  Stated that he is not interested in finding a job, and that his ultimate goal is to find a place of his own and "chill".  Stated that for step to achieving that goal is  to quit using substances per above.  Patient denied alcohol use, said he cannot remember the last time he use it.  Also denied other illicit stimulants, benzodiazepine, cannabis, hallucinogens.  Patient denied depressed mood, anxious mood.  Reported that his sleep is poor because of housing instability, stated that otherwise he has no difficulty sleeping.  Does state that his energy is low.  Once starting on his home medications, patient stated that his appetite has improved.  Denied feelings of guilt or hopelessness.  Denied decreased interest.  Denied anxiety or panic attacks.  Denied nightmares, flashbacks.  Denied suicide attempt.  Denied inpatient psych admission.  Stated that he has been to residential in the past, and found them helpful.  Reported that he has no family here in [place].  Stated that his his sister and children are in [place].  Stated that he cannot live with a sister, as there is no space.  Stated that he attends church about 3 times a week.  That his other goal is to read the Bible more.  WG:[id number] Thoughts: No (Last time was 11/12.  Contracted to safety.) MV:HQIONGEXB Thoughts: No MWU:[id number]: None (Denied AVH) Ideas of UYQ:IHKV   Mood:  (Tired) Sleep:Good Appetite: Good, much improved after restarting home diabetic medications.  Review of Systems  Constitutional:  Positive for malaise/fatigue.  Respiratory:  Negative for shortness of breath.  Cardiovascular:  Negative for chest pain.  Gastrointestinal:  Negative for abdominal pain, constipation, diarrhea, nausea and vomiting.  Genitourinary:        Polyuria  Musculoskeletal:  Positive for back pain.  Neurological:  Negative for dizziness, tremors and headaches.    PHQ 2-9:  [employer] Office Visit from 10/08/2021 in [facility] Office Visit from 09/05/2021 in [facility] ED from 07/02/2021 in [facility]  Thoughts that you  would be better off dead, or of hurting yourself in some way Not at all Not at all Several days  PHQ-9 Total Score 23 11 7        Flowsheet Row ED from 01/21/2022 in [facility] ED from 01/20/2022 in [facility] EMERGENCY DEPARTMENT ED from 01/18/2022 in [facility] EMERGENCY DEPARTMENT  C-SSRS RISK CATEGORY Low Risk Low Risk No Risk        Musculoskeletal  Strength & Muscle Tone: within normal limits Gait & Station: Patient laying down, unable to assess Patient leans: Patient laying down, unable to assess  Physical Exam Vitals and nursing note reviewed.  Constitutional:      General: He is not in acute distress.    Appearance: He is not ill-appearing, toxic-appearing or diaphoretic.  HENT:     Head: Normocephalic.  Pulmonary:     Effort: Pulmonary effort is normal. No respiratory distress.  Neurological:     Mental Status: He is alert.     Psychiatric Specialty Exam  BP (!) 105/103 (BP Location: Right Arm) Comment: Annemarie RN Notified  Pulse (!) 101 Comment: Annemarie RN Notified  Temp 97.6 F (36.4 C)   Resp 19   SpO2 98%   Presentation  General Appearance:Disheveled Eye Contact:None Speech:Slow (Mostly clear and coherent.  Mumbling at times.) Volume:Decreased Handedness:Right  Mood and Affect  Mood: (Tired) Affect:Appropriate, Congruent, Depressed, Restricted  Thought Process  Thought Process:Coherent, Goal Directed, Linear Descriptions of Associations:Intact  Thought Content Suicidal Thoughts:Suicidal Thoughts: No (Last time was 11/12.  Contracted to safety.) Homicidal Thoughts:Homicidal Thoughts: No Hallucinations:Hallucinations: None (Denied AVH) Ideas of Reference:None Thought Content:WDL  Sensorium  Memory:Immediate Good Judgment:Fair Insight:Fair  Executive Functions  Orientation:Full (Time, Place and Person) Language:Good Concentration:Good Attention:Good Recall:Good Fund of  Knowledge:Good  Psychomotor Activity  Psychomotor Activity:Psychomotor Activity: Psychomotor Retardation  Assets  Assets:Communication Skills, Desire for Improvement, Leisure Time  Sleep  Quality:Good    Nutritional Assessment (For OBS and [facility] admissions only) Has the patient had a weight loss or gain of 10 pounds or more in the last 3 months?: Yes Has the patient had a decrease in food intake/or appetite?: Yes Does the patient have dental problems?: Yes Does the patient have eating habits or behaviors that may be indicators of an eating disorder including binging or inducing vomiting?: No Has the patient recently lost weight without trying?: 1 Has the patient been eating poorly because of a decreased appetite?: 1 Malnutrition Screening Tool Score: 2      Is the patient at risk to self? No  Has the patient been a risk to self in the past 6 months? No .    Has the patient been a risk to self within the distant past? No   Is the patient a risk to others? No   Has the patient been a risk to others in the past 6 months? No   Has the patient been a risk to others within the distant past? No  Past Medical History:  Past Medical History:  Diagnosis Date  . Angioedema of lips 07/28/2012   left upper (07/29/2012)  . Arthritis    hands and back  . Chronic back pain   . Cocaine abuse (HCC)   . Diabetic neuropathy (HCC)   . High cholesterol   . Hypertension   . Neuropathy   . Type II diabetes mellitus (HCC)     Past Surgical History:  Procedure Laterality Date  . BACK SURGERY    . CYSTOSCOPY W/ URETERAL STENT PLACEMENT Right 02/23/2021   Procedure: CYSTOSCOPY WITH RETROGRADE PYELOGRAM/URETERAL STENT PLACEMENT;  Surgeon: Jannifer Hick, MD;  Location: [facility] ORS;  Service: Urology;  Laterality: Right;  . HERNIA REPAIR Right 04/01/2012  . I & D EXTREMITY Left 12/13/2012   Procedure: IRRIGATION AND DEBRIDEMENT LEFT THUMB;  Surgeon: Tami Ribas, MD;  Location: [facility] OR;  Service:  Orthopedics;  Laterality: Left;  . INCISION AND DRAINAGE OF WOUND     boil on back/notes 07/14/2008 (07/29/2012)  . INGUINAL HERNIA REPAIR  04/01/2012   Procedure: HERNIA REPAIR INGUINAL ADULT;  Surgeon: Currie Paris, MD;  Location: [facility] OR;  Service: General;  Laterality: Right;  . INGUINAL HERNIA REPAIR Left 09/06/2016   Procedure: OPEN REPAIR LEFT INGUINAL HERNIA;  Surgeon: Gaynelle Adu, MD;  Location: [facility] OR;  Service: General;  Laterality: Left;  . INSERTION OF MESH Left 09/06/2016   Procedure: INSERTION OF MESH;  Surgeon: Gaynelle Adu, MD;  Location: [facility] OR;  Service: General;  Laterality: Left;  . TONSILLECTOMY     Family History:  Family History  Problem Relation Age of Onset  . Diabetes Mother   . Kidney disease Mother   . Hyperlipidemia Mother   . Hyperlipidemia Father   . Diabetes Father    Social History:  Social History   Socioeconomic History  . Marital status: Divorced    Spouse name: Not on file  . Number of children: 3  . Years of education: 12  . Highest education level: Not on file  Occupational History    Comment: disabled  Tobacco Use  . Smoking status: Every Day    Packs/day: 0.25    Years: 30.00    Total pack years: 7.50    Types: Cigarettes  . Smokeless tobacco: Former    Quit date: 08/15/2015  . Tobacco comments:    04/29/16 2  cigs daily, 10/27/17 sometimes < .25 PPD  Vaping Use  . Vaping Use: Never used  Substance and Sexual Activity  . Alcohol use: No    Alcohol/week: 0.0 standard drinks of alcohol  . Drug use: Not Currently    Types: "Crack" cocaine  . Sexual activity: Not on file  Other Topics Concern  . Not on file  Social History Narrative   04/29/17   Lives in shelter   Caffeine- a lot of  tea, coffee   Social Determinants of Health   Financial Resource Strain: Not on file  Food Insecurity: Not on file  Transportation Needs: Not on file  Physical Activity: Not on file  Stress: Not on file  Social Connections: Not on file  Intimate  Partner Violence: Not on file    SDOH:  SDOH Screenings   Depression (PHQ2-9): High Risk (10/08/2021)  Tobacco Use: High Risk (01/20/2022)   Last Labs:  Admission on 01/21/2022  Component Date Value Ref Range Status  . Glucose-Capillary 01/21/2022 374 (H)  70 - 99 mg/dL Final  Admission on 30/86/5784, Discharged on 01/21/2022  Component Date Value Ref Range Status  . SARS Coronavirus 2 by RT PCR 01/20/2022 NEGATIVE  NEGATIVE Final  . Influenza A by PCR 01/20/2022 NEGATIVE  NEGATIVE Final  . Influenza B by PCR 01/20/2022 NEGATIVE  NEGATIVE Final  . Sodium 01/20/2022 136  135 - 145 mmol/L Final  . Potassium 01/20/2022 4.0  3.5 - 5.1 mmol/L Final  . Chloride 01/20/2022 103  98 - 111 mmol/L Final  . CO2 01/20/2022 24  22 - 32 mmol/L Final  . Glucose, Bld 01/20/2022 360 (H)  70 - 99 mg/dL Final  . BUN 45/40/9811 12  8 - 23 mg/dL Final  . Creatinine, Ser 01/20/2022 1.01  0.61 - 1.24 mg/dL Final  . Calcium 91/47/8295 9.4  8.9 - 10.3 mg/dL Final  . Total Protein 01/20/2022 7.3  6.5 - 8.1 g/dL Final  . Albumin 62/13/0865 3.4 (L)  3.5 - 5.0 g/dL Final  . AST 78/46/9629 14 (L)  15 - 41 U/L Final  . ALT 01/20/2022 10  0 - 44 U/L Final  . Alkaline Phosphatase 01/20/2022 101  38 - 126 U/L Final  . Total Bilirubin 01/20/2022 0.3  0.3 - 1.2 mg/dL Final  . GFR, Estimated 01/20/2022 >60  >60 mL/min Final  . Anion gap 01/20/2022 9  5 - 15 Final  . Alcohol, Ethyl (B) 01/20/2022 <10  <10 mg/dL Final  . Opiates 52/84/1324 NONE DETECTED  NONE DETECTED Final  . Cocaine 01/20/2022 POSITIVE (A)  NONE DETECTED Final  . Benzodiazepines 01/20/2022 NONE DETECTED  NONE DETECTED Final  . Amphetamines 01/20/2022 NONE DETECTED  NONE DETECTED Final  . Tetrahydrocannabinol 01/20/2022 NONE DETECTED  NONE DETECTED Final  . Barbiturates 01/20/2022 NONE DETECTED  NONE DETECTED Final  . WBC 01/20/2022 5.2  4.0 - 10.5 K/uL Final  . RBC 01/20/2022 4.73  4.22 - 5.81 MIL/uL Final  . Hemoglobin 01/20/2022 14.3  13.0 -  17.0 g/dL Final  . HCT 40/12/2723 43.1  39.0 - 52.0 % Final  . MCV 01/20/2022 91.1  80.0 - 100.0 fL Final  . MCH 01/20/2022 30.2  26.0 - 34.0 pg Final  . MCHC 01/20/2022 33.2  30.0 - 36.0 g/dL Final  . RDW 36/64/4034 14.1  11.5 - 15.5 % Final  . Platelets 01/20/2022 444 (H)  150 - 400 K/uL Final  . nRBC 01/20/2022 0.0  0.0 - 0.2 % Final  . Neutrophils Relative % 01/20/2022 61  % Final  . Neutro Abs 01/20/2022 3.2  1.7 - 7.7 K/uL Final  . Lymphocytes Relative 01/20/2022 29  % Final  . Lymphs Abs 01/20/2022 1.5  0.7 - 4.0 K/uL Final  . Monocytes Relative 01/20/2022 7  % Final  . Monocytes Absolute 01/20/2022 0.4  0.1 - 1.0 K/uL Final  . Eosinophils Relative 01/20/2022 2  % Final  . Eosinophils Absolute 01/20/2022 0.1  0.0 - 0.5 K/uL Final  . Basophils Relative 01/20/2022 1  % Final  . Basophils Absolute 01/20/2022 0.0  0.0 - 0.1 K/uL Final  . Immature Granulocytes 01/20/2022 0  % Final  . Abs Immature Granulocytes 01/20/2022 0.01  0.00 - 0.07 K/uL Final  . Salicylate Lvl 01/20/2022 <7.0 (L)  7.0 - 30.0 mg/dL Final  . Acetaminophen (Tylenol), Serum 01/20/2022 <10 (L)  10 - 30 ug/mL Final  . Glucose-Capillary 01/20/2022 344 (H)  70 - 99 mg/dL Final  . Comment 1 74/25/9563 Notify RN   Final  . Comment 2 01/20/2022 Document in Chart   Final  . Glucose-Capillary 01/20/2022 479 (  H)  70 - 99 mg/dL Final  . Glucose-Capillary 01/21/2022 243 (H)  70 - 99 mg/dL Final  . Glucose-Capillary 01/21/2022 224 (H)  70 - 99 mg/dL Final  . Glucose-Capillary 01/21/2022 282 (H)  70 - 99 mg/dL Final  Admission on 16/12/9602, Discharged on 01/19/2022  Component Date Value Ref Range Status  . WBC 01/18/2022 5.3  4.0 - 10.5 K/uL Final  . RBC 01/18/2022 4.47  4.22 - 5.81 MIL/uL Final  . Hemoglobin 01/18/2022 13.7  13.0 - 17.0 g/dL Final  . HCT 54/11/8117 41.0  39.0 - 52.0 % Final  . MCV 01/18/2022 91.7  80.0 - 100.0 fL Final  . MCH 01/18/2022 30.6  26.0 - 34.0 pg Final  . MCHC 01/18/2022 33.4  30.0 - 36.0  g/dL Final  . RDW 14/78/2956 14.2  11.5 - 15.5 % Final  . Platelets 01/18/2022 445 (H)  150 - 400 K/uL Final  . nRBC 01/18/2022 0.0  0.0 - 0.2 % Final  . Sodium 01/18/2022 136  135 - 145 mmol/L Final  . Potassium 01/18/2022 4.3  3.5 - 5.1 mmol/L Final  . Chloride 01/18/2022 100  98 - 111 mmol/L Final  . CO2 01/18/2022 22  22 - 32 mmol/L Final  . Glucose, Bld 01/18/2022 499 (H)  70 - 99 mg/dL Final  . BUN 21/30/8657 9  8 - 23 mg/dL Final  . Creatinine, Ser 01/18/2022 1.23  0.61 - 1.24 mg/dL Final  . Calcium 84/69/6295 9.1  8.9 - 10.3 mg/dL Final  . Total Protein 01/18/2022 6.6  6.5 - 8.1 g/dL Final  . Albumin 28/41/3244 3.2 (L)  3.5 - 5.0 g/dL Final  . AST 03/13/7251 18  15 - 41 U/L Final  . ALT 01/18/2022 9  0 - 44 U/L Final  . Alkaline Phosphatase 01/18/2022 95  38 - 126 U/L Final  . Total Bilirubin 01/18/2022 0.3  0.3 - 1.2 mg/dL Final  . GFR, Estimated 01/18/2022 >60  >60 mL/min Final  . Anion gap 01/18/2022 14  5 - 15 Final  . Color, Urine 01/18/2022 YELLOW  YELLOW Final  . APPearance 01/18/2022 CLEAR  CLEAR Final  . Specific Gravity, Urine 01/18/2022 1.025  1.005 - 1.030 Final  . pH 01/18/2022 6.0  5.0 - 8.0 Final  . Glucose, UA 01/18/2022 >=500 (A)  NEGATIVE mg/dL Final  . Hgb urine dipstick 01/18/2022 NEGATIVE  NEGATIVE Final  . Bilirubin Urine 01/18/2022 NEGATIVE  NEGATIVE Final  . Ketones, ur 01/18/2022 NEGATIVE  NEGATIVE mg/dL Final  . Protein, ur 66/44/0347 NEGATIVE  NEGATIVE mg/dL Final  . Nitrite 42/59/5638 NEGATIVE  NEGATIVE Final  . Glori Luis 01/18/2022 SMALL (A)  NEGATIVE Final  . RBC / HPF 01/18/2022 0-5  0 - 5 RBC/hpf Final  . WBC, UA 01/18/2022 21-50  0 - 5 WBC/hpf Final  . Bacteria, UA 01/18/2022 RARE (A)  NONE SEEN Final  . Squamous Epithelial / LPF 01/18/2022 0-5  0 - 5 Final  . Budding Yeast 01/18/2022 PRESENT   Final  . Opiates 01/18/2022 NONE DETECTED  NONE DETECTED Final  . Cocaine 01/18/2022 POSITIVE (A)  NONE DETECTED Final  . Benzodiazepines  01/18/2022 NONE DETECTED  NONE DETECTED Final  . Amphetamines 01/18/2022 NONE DETECTED  NONE DETECTED Final  . Tetrahydrocannabinol 01/18/2022 NONE DETECTED  NONE DETECTED Final  . Barbiturates 01/18/2022 NONE DETECTED  NONE DETECTED Final  . Glucose-Capillary 01/18/2022 483 (H)  70 - 99 mg/dL Final  . Glucose-Capillary 01/19/2022 586 (HH)  70 - 99 mg/dL Final  .  Glucose-Capillary 01/19/2022 456 (H)  70 - 99 mg/dL Final  . Glucose-Capillary 01/19/2022 515 (HH)  70 - 99 mg/dL Final  . Comment 1 16/12/9602 Notify RN   Final  . Glucose-Capillary 01/19/2022 164 (H)  70 - 99 mg/dL Final  Admission on 54/11/8117, Discharged on 01/07/2022  Component Date Value Ref Range Status  . Glucose-Capillary 01/07/2022 267 (H)  70 - 99 mg/dL Final  Admission on 14/78/2956, Discharged on 01/02/2022  Component Date Value Ref Range Status  . WBC 01/02/2022 7.0  4.0 - 10.5 K/uL Final  . RBC 01/02/2022 4.54  4.22 - 5.81 MIL/uL Final  . Hemoglobin 01/02/2022 13.9  13.0 - 17.0 g/dL Final  . HCT 21/30/8657 41.4  39.0 - 52.0 % Final  . MCV 01/02/2022 91.2  80.0 - 100.0 fL Final  . MCH 01/02/2022 30.6  26.0 - 34.0 pg Final  . MCHC 01/02/2022 33.6  30.0 - 36.0 g/dL Final  . RDW 84/69/6295 14.1  11.5 - 15.5 % Final  . Platelets 01/02/2022 369  150 - 400 K/uL Final  . nRBC 01/02/2022 0.0  0.0 - 0.2 % Final  . Neutrophils Relative % 01/02/2022 59  % Final  . Neutro Abs 01/02/2022 4.1  1.7 - 7.7 K/uL Final  . Lymphocytes Relative 01/02/2022 28  % Final  . Lymphs Abs 01/02/2022 1.9  0.7 - 4.0 K/uL Final  . Monocytes Relative 01/02/2022 8  % Final  . Monocytes Absolute 01/02/2022 0.5  0.1 - 1.0 K/uL Final  . Eosinophils Relative 01/02/2022 4  % Final  . Eosinophils Absolute 01/02/2022 0.3  0.0 - 0.5 K/uL Final  . Basophils Relative 01/02/2022 1  % Final  . Basophils Absolute 01/02/2022 0.1  0.0 - 0.1 K/uL Final  . Immature Granulocytes 01/02/2022 0  % Final  . Abs Immature Granulocytes 01/02/2022 0.02  0.00 - 0.07  K/uL Final  . Sodium 01/02/2022 138  135 - 145 mmol/L Final  . Potassium 01/02/2022 4.1  3.5 - 5.1 mmol/L Final  . Chloride 01/02/2022 104  98 - 111 mmol/L Final  . CO2 01/02/2022 25  22 - 32 mmol/L Final  . Glucose, Bld 01/02/2022 216 (H)  70 - 99 mg/dL Final  . BUN 28/41/3244 11  8 - 23 mg/dL Final  . Creatinine, Ser 01/02/2022 0.98  0.61 - 1.24 mg/dL Final  . Calcium 03/13/7251 9.5  8.9 - 10.3 mg/dL Final  . Total Protein 01/02/2022 7.4  6.5 - 8.1 g/dL Final  . Albumin 66/44/0347 3.4 (L)  3.5 - 5.0 g/dL Final  . AST 42/59/5638 12 (L)  15 - 41 U/L Final  . ALT 01/02/2022 11  0 - 44 U/L Final  . Alkaline Phosphatase 01/02/2022 90  38 - 126 U/L Final  . Total Bilirubin 01/02/2022 0.9  0.3 - 1.2 mg/dL Final  . GFR, Estimated 01/02/2022 >60  >60 mL/min Final  . Anion gap 01/02/2022 9  5 - 15 Final  [facility] on 11/01/2021  Component Date Value Ref Range Status  . POC Glucose 11/27/2021 301 (A)  70 - 99 mg/dl Final  Admission on 75/64/3329, Discharged on 10/15/2021  Component Date Value Ref Range Status  . Glucose-Capillary 10/13/2021 388 (H)  70 - 99 mg/dL Final  . SARS Coronavirus 2 by RT PCR 10/13/2021 NEGATIVE  NEGATIVE Final  . Influenza A by PCR 10/13/2021 NEGATIVE  NEGATIVE Final  . Influenza B by PCR 10/13/2021 NEGATIVE  NEGATIVE Final  . WBC 10/13/2021 9.5  4.0 -  10.5 K/uL Final  . RBC 10/13/2021 4.23  4.22 - 5.81 MIL/uL Final  . Hemoglobin 10/13/2021 13.0  13.0 - 17.0 g/dL Final  . HCT 62/95/2841 40.4  39.0 - 52.0 % Final  . MCV 10/13/2021 95.5  80.0 - 100.0 fL Final  . MCH 10/13/2021 30.7  26.0 - 34.0 pg Final  . MCHC 10/13/2021 32.2  30.0 - 36.0 g/dL Final  . RDW 32/44/0102 13.4  11.5 - 15.5 % Final  . Platelets 10/13/2021 551 (H)  150 - 400 K/uL Final  . nRBC 10/13/2021 0.0  0.0 - 0.2 % Final  . Neutrophils Relative % 10/13/2021 68  % Final  . Neutro Abs 10/13/2021 6.5  1.7 - 7.7 K/uL Final  . Lymphocytes Relative 10/13/2021 18  % Final  . Lymphs  Abs 10/13/2021 1.7  0.7 - 4.0 K/uL Final  . Monocytes Relative 10/13/2021 9  % Final  . Monocytes Absolute 10/13/2021 0.8  0.1 - 1.0 K/uL Final  . Eosinophils Relative 10/13/2021 3  % Final  . Eosinophils Absolute 10/13/2021 0.3  0.0 - 0.5 K/uL Final  . Basophils Relative 10/13/2021 1  % Final  . Basophils Absolute 10/13/2021 0.1  0.0 - 0.1 K/uL Final  . Immature Granulocytes 10/13/2021 1  % Final  . Abs Immature Granulocytes 10/13/2021 0.11 (H)  0.00 - 0.07 K/uL Final  . Sodium 10/13/2021 136  135 - 145 mmol/L Final  . Potassium 10/13/2021 4.3  3.5 - 5.1 mmol/L Final  . Chloride 10/13/2021 104  98 - 111 mmol/L Final  . CO2 10/13/2021 24  22 - 32 mmol/L Final  . Glucose, Bld 10/13/2021 296 (H)  70 - 99 mg/dL Final  . BUN 72/53/6644 14  8 - 23 mg/dL Final  . Creatinine, Ser 10/13/2021 1.15  0.61 - 1.24 mg/dL Final  . Calcium 03/47/4259 9.0  8.9 - 10.3 mg/dL Final  . Total Protein 10/13/2021 7.5  6.5 - 8.1 g/dL Final  . Albumin 56/38/7564 3.0 (L)  3.5 - 5.0 g/dL Final  . AST 33/29/5188 13 (L)  15 - 41 U/L Final  . ALT 10/13/2021 10  0 - 44 U/L Final  . Alkaline Phosphatase 10/13/2021 84  38 - 126 U/L Final  . Total Bilirubin 10/13/2021 0.5  0.3 - 1.2 mg/dL Final  . GFR, Estimated 10/13/2021 >60  >60 mL/min Final  . Anion gap 10/13/2021 8  5 - 15 Final  . Color, Urine 10/13/2021 YELLOW  YELLOW Final  . APPearance 10/13/2021 CLOUDY (A)  CLEAR Final  . Specific Gravity, Urine 10/13/2021 1.022  1.005 - 1.030 Final  . pH 10/13/2021 5.0  5.0 - 8.0 Final  . Glucose, UA 10/13/2021 >=500 (A)  NEGATIVE mg/dL Final  . Hgb urine dipstick 10/13/2021 LARGE (A)  NEGATIVE Final  . Bilirubin Urine 10/13/2021 NEGATIVE  NEGATIVE Final  . Ketones, ur 10/13/2021 NEGATIVE  NEGATIVE mg/dL Final  . Protein, ur 41/66/0630 30 (A)  NEGATIVE mg/dL Final  . Nitrite 16/03/930 NEGATIVE  NEGATIVE Final  . Glori Luis 10/13/2021 LARGE (A)  NEGATIVE Final  . RBC / HPF 10/13/2021 >50 (H)  0 - 5 RBC/hpf Final  .  WBC, UA 10/13/2021 >50 (H)  0 - 5 WBC/hpf Final  . Bacteria, UA 10/13/2021 RARE (A)  NONE SEEN Final  . Squamous Epithelial / LPF 10/13/2021 0-5  0 - 5 Final  . WBC Clumps 10/13/2021 PRESENT   Final  . Mucus 10/13/2021 PRESENT   Final  . Budding Yeast 10/13/2021 PRESENT  Final  . Non Squamous Epithelial 10/13/2021 0-5 (A)  NONE SEEN Final  . pH, Ven 10/13/2021 7.457 (H)  7.25 - 7.43 Final  . pCO2, Ven 10/13/2021 38.1 (L)  44 - 60 mmHg Final  . pO2, Ven 10/13/2021 174 (H)  32 - 45 mmHg Final  . Bicarbonate 10/13/2021 26.9  20.0 - 28.0 mmol/L Final  . TCO2 10/13/2021 28  22 - 32 mmol/L Final  . O2 Saturation 10/13/2021 100  % Final  . Acid-Base Excess 10/13/2021 3.0 (H)  0.0 - 2.0 mmol/L Final  . Sodium 10/13/2021 137  135 - 145 mmol/L Final  . Potassium 10/13/2021 4.3  3.5 - 5.1 mmol/L Final  . Calcium, Ion 10/13/2021 1.10 (L)  1.15 - 1.40 mmol/L Final  . HCT 10/13/2021 40.0  39.0 - 52.0 % Final  . Hemoglobin 10/13/2021 13.6  13.0 - 17.0 g/dL Final  . Sample type 16/12/9602 VENOUS   Final  . Opiates 10/13/2021 NONE DETECTED  NONE DETECTED Final  . Cocaine 10/13/2021 POSITIVE (A)  NONE DETECTED Final  . Benzodiazepines 10/13/2021 NONE DETECTED  NONE DETECTED Final  . Amphetamines 10/13/2021 NONE DETECTED  NONE DETECTED Final  . Tetrahydrocannabinol 10/13/2021 NONE DETECTED  NONE DETECTED Final  . Barbiturates 10/13/2021 NONE DETECTED  NONE DETECTED Final  . Adenovirus 10/13/2021 NOT DETECTED  NOT DETECTED Final  . Coronavirus 229E 10/13/2021 NOT DETECTED  NOT DETECTED Final  . Coronavirus HKU1 10/13/2021 NOT DETECTED  NOT DETECTED Final  . Coronavirus NL63 10/13/2021 NOT DETECTED  NOT DETECTED Final  . Coronavirus OC43 10/13/2021 NOT DETECTED  NOT DETECTED Final  . Metapneumovirus 10/13/2021 NOT DETECTED  NOT DETECTED Final  . Rhinovirus / Enterovirus 10/13/2021 NOT DETECTED  NOT DETECTED Final  . Influenza A 10/13/2021 NOT DETECTED  NOT DETECTED Final  . Influenza B 10/13/2021 NOT  DETECTED  NOT DETECTED Final  . Parainfluenza Virus 1 10/13/2021 NOT DETECTED  NOT DETECTED Final  . Parainfluenza Virus 2 10/13/2021 NOT DETECTED  NOT DETECTED Final  . Parainfluenza Virus 3 10/13/2021 NOT DETECTED  NOT DETECTED Final  . Parainfluenza Virus 4 10/13/2021 NOT DETECTED  NOT DETECTED Final  . Respiratory Syncytial Virus 10/13/2021 NOT DETECTED  NOT DETECTED Final  . Bordetella pertussis 10/13/2021 NOT DETECTED  NOT DETECTED Final  . Bordetella Parapertussis 10/13/2021 NOT DETECTED  NOT DETECTED Final  . Chlamydophila pneumoniae 10/13/2021 NOT DETECTED  NOT DETECTED Final  . Mycoplasma pneumoniae 10/13/2021 NOT DETECTED  NOT DETECTED Final  . Procalcitonin 10/13/2021 <0.10  ng/mL Final  . B Natriuretic Peptide 10/13/2021 23.6  0.0 - 100.0 pg/mL Final  . L. pneumophila Serogp 1 Ur Ag 10/13/2021 Negative  Negative Final  . Source of Sample 10/13/2021 URINE, RANDOM   Final  . Strep Pneumo Urinary Antigen 10/13/2021 NEGATIVE  NEGATIVE Final  . Sodium 10/14/2021 138  135 - 145 mmol/L Final  . Potassium 10/14/2021 4.3  3.5 - 5.1 mmol/L Final  . Chloride 10/14/2021 106  98 - 111 mmol/L Final  . CO2 10/14/2021 25  22 - 32 mmol/L Final  . Glucose, Bld 10/14/2021 286 (H)  70 - 99 mg/dL Final  . BUN 54/11/8117 14  8 - 23 mg/dL Final  . Creatinine, Ser 10/14/2021 0.98  0.61 - 1.24 mg/dL Final  . Calcium 14/78/2956 8.9  8.9 - 10.3 mg/dL Final  . GFR, Estimated 10/14/2021 >60  >60 mL/min Final  . Anion gap 10/14/2021 7  5 - 15 Final  . WBC 10/14/2021 9.6  4.0 -  10.5 K/uL Final  . RBC 10/14/2021 4.12 (L)  4.22 - 5.81 MIL/uL Final  . Hemoglobin 10/14/2021 12.6 (L)  13.0 - 17.0 g/dL Final  . HCT 40/98/1191 38.8 (L)  39.0 - 52.0 % Final  . MCV 10/14/2021 94.2  80.0 - 100.0 fL Final  . MCH 10/14/2021 30.6  26.0 - 34.0 pg Final  . MCHC 10/14/2021 32.5  30.0 - 36.0 g/dL Final  . RDW 47/82/9562 13.4  11.5 - 15.5 % Final  . Platelets 10/14/2021 556 (H)  150 - 400 K/uL Final  . nRBC 10/14/2021  0.0  0.0 - 0.2 % Final  . Glucose-Capillary 10/13/2021 281 (H)  70 - 99 mg/dL Final  . Glucose-Capillary 10/13/2021 399 (H)  70 - 99 mg/dL Final  . Glucose-Capillary 10/14/2021 239 (H)  70 - 99 mg/dL Final  . Glucose-Capillary 10/14/2021 345 (H)  70 - 99 mg/dL Final  . Glucose-Capillary 10/14/2021 383 (H)  70 - 99 mg/dL Final  . Glucose-Capillary 10/15/2021 194 (H)  70 - 99 mg/dL Final  . Glucose-Capillary 10/15/2021 161 (H)  70 - 99 mg/dL Final  . Glucose-Capillary 10/15/2021 312 (H)  70 - 99 mg/dL Final  . Glucose-Capillary 10/14/2021 279 (H)  70 - 99 mg/dL Final  Office Visit on 10/08/2021  Component Date Value Ref Range Status  . POC Glucose 10/08/2021 321 (A)  70 - 99 mg/dl Final  . ZHY8M, POC (controlled diabetic ra* 10/08/2021 9.3 (A)  0.0 - 7.0 % Final  Admission on 10/07/2021, Discharged on 10/07/2021  Component Date Value Ref Range Status  . Color, Urine 10/07/2021 YELLOW  YELLOW Final  . APPearance 10/07/2021 HAZY (A)  CLEAR Final  . Specific Gravity, Urine 10/07/2021 1.010  1.005 - 1.030 Final  . pH 10/07/2021 5.0  5.0 - 8.0 Final  . Glucose, UA 10/07/2021 >=500 (A)  NEGATIVE mg/dL Final  . Hgb urine dipstick 10/07/2021 MODERATE (A)  NEGATIVE Final  . Bilirubin Urine 10/07/2021 NEGATIVE  NEGATIVE Final  . Ketones, ur 10/07/2021 NEGATIVE  NEGATIVE mg/dL Final  . Protein, ur 57/84/6962 30 (A)  NEGATIVE mg/dL Final  . Nitrite 95/28/4132 NEGATIVE  NEGATIVE Final  . Glori Luis 10/07/2021 LARGE (A)  NEGATIVE Final  . RBC / HPF 10/07/2021 21-50  0 - 5 RBC/hpf Final  . WBC, UA 10/07/2021 >50 (H)  0 - 5 WBC/hpf Final  . Bacteria, UA 10/07/2021 NONE SEEN  NONE SEEN Final  . WBC Clumps 10/07/2021 PRESENT   Final  . Mucus 10/07/2021 PRESENT   Final  . Budding Yeast 10/07/2021 PRESENT   Final  . WBC 10/07/2021 8.9  4.0 - 10.5 K/uL Final  . RBC 10/07/2021 4.35  4.22 - 5.81 MIL/uL Final  . Hemoglobin 10/07/2021 13.7  13.0 - 17.0 g/dL Final  . HCT 44/03/270 41.9  39.0 - 52.0 %  Final  . MCV 10/07/2021 96.3  80.0 - 100.0 fL Final  . MCH 10/07/2021 31.5  26.0 - 34.0 pg Final  . MCHC 10/07/2021 32.7  30.0 - 36.0 g/dL Final  . RDW 53/66/4403 13.4  11.5 - 15.5 % Final  . Platelets 10/07/2021 376  150 - 400 K/uL Final  . nRBC 10/07/2021 0.0  0.0 - 0.2 % Final  . Neutrophils Relative % 10/07/2021 59  % Final  . Neutro Abs 10/07/2021 5.2  1.7 - 7.7 K/uL Final  . Lymphocytes Relative 10/07/2021 17  % Final  . Lymphs Abs 10/07/2021 1.5  0.7 - 4.0 K/uL Final  . Monocytes Relative  10/07/2021 19  % Final  . Monocytes Absolute 10/07/2021 1.7 (H)  0.1 - 1.0 K/uL Final  . Eosinophils Relative 10/07/2021 5  % Final  . Eosinophils Absolute 10/07/2021 0.5  0.0 - 0.5 K/uL Final  . Basophils Relative 10/07/2021 0  % Final  . Basophils Absolute 10/07/2021 0.0  0.0 - 0.1 K/uL Final  . Immature Granulocytes 10/07/2021 0  % Final  . Abs Immature Granulocytes 10/07/2021 0.04  0.00 - 0.07 K/uL Final  . Sodium 10/07/2021 141  135 - 145 mmol/L Final  . Potassium 10/07/2021 4.0  3.5 - 5.1 mmol/L Final  . Chloride 10/07/2021 107  98 - 111 mmol/L Final  . CO2 10/07/2021 24  22 - 32 mmol/L Final  . Glucose, Bld 10/07/2021 81  70 - 99 mg/dL Final  . BUN 53/66/4403 14  8 - 23 mg/dL Final  . Creatinine, Ser 10/07/2021 1.19  0.61 - 1.24 mg/dL Final  . Calcium 47/42/5956 9.4  8.9 - 10.3 mg/dL Final  . Total Protein 10/07/2021 7.9  6.5 - 8.1 g/dL Final  . Albumin 38/75/6433 3.5  3.5 - 5.0 g/dL Final  . AST 29/51/8841 12 (L)  15 - 41 U/L Final  . ALT 10/07/2021 10  0 - 44 U/L Final  . Alkaline Phosphatase 10/07/2021 73  38 - 126 U/L Final  . Total Bilirubin 10/07/2021 0.7  0.3 - 1.2 mg/dL Final  . GFR, Estimated 10/07/2021 >60  >60 mL/min Final  . Anion gap 10/07/2021 10  5 - 15 Final  . Specimen Description 10/07/2021    Final                   Value:URINE, CLEAN CATCH Performed at [facility], [address] [place], [place] [address]   . Special Requests 10/07/2021     Final                   Value:NONE Performed at [facility], [address] [place], [place] [address]   . Culture 10/07/2021  (A)   Final                   Value:40,000 COLONIES/mL YEAST 20,000 COLONIES/mL LACTOBACILLUS SPECIES Standardized susceptibility testing for this organism is not available. Performed at [facility], [address] [place], [place] [address]   . Report Status 10/07/2021 10/08/2021 FINAL   Final  Admission on 10/04/2021, Discharged on 10/04/2021  Component Date Value Ref Range Status  . WBC 10/04/2021 9.3  4.0 - 10.5 K/uL Final  . RBC 10/04/2021 4.26  4.22 - 5.81 MIL/uL Final  . Hemoglobin 10/04/2021 13.4  13.0 - 17.0 g/dL Final  . HCT 55/73/2202 41.5  39.0 - 52.0 % Final  . MCV 10/04/2021 97.4  80.0 - 100.0 fL Final  . MCH 10/04/2021 31.5  26.0 - 34.0 pg Final  . MCHC 10/04/2021 32.3  30.0 - 36.0 g/dL Final  . RDW 54/27/0623 13.6  11.5 - 15.5 % Final  . Platelets 10/04/2021 367  150 - 400 K/uL Final  . nRBC 10/04/2021 0.0  0.0 - 0.2 % Final  . Sodium 10/04/2021 138  135 - 145 mmol/L Final  . Potassium 10/04/2021 4.4  3.5 - 5.1 mmol/L Final  . Chloride 10/04/2021 107  98 - 111 mmol/L Final  . CO2 10/04/2021 22  22 - 32 mmol/L Final  . Glucose, Bld 10/04/2021 236 (H)  70 - 99 mg/dL Final  .  BUN 10/04/2021 13  8 - 23 mg/dL Final  . Creatinine, Ser 10/04/2021 1.15  0.61 - 1.24 mg/dL Final  . Calcium 16/12/9602 8.9  8.9 - 10.3 mg/dL Final  . GFR, Estimated 10/04/2021 >60  >60 mL/min Final  . Anion gap 10/04/2021 9  5 - 15 Final  There may be more visits with results that are not included.   Allergies: Lisinopril  PTA Medications: (Not in a hospital admission)  Long Term Goals: Improvement in symptoms so as ready for discharge Short Term Goals: Patient will verbalize feelings in meetings with treatment team members., Patient will attend at least of 50% of the groups daily., Pt will complete the PHQ9 on admission, day 3 and  discharge., Patient will participate in completing the [place] Suicide Severity Rating Scale, Patient will score a low risk of violence for 24 hours prior to discharge, and Patient will take medications as prescribed daily.  Medical Decision Making  David Wyatt is a 61 y.o. male with PMH cocaine use disorder, tobacco use disorder, housing instability, DM2 + neuropathy, HTN, HLD, no inpatient psych admission, no suicide attempts, who initially presented to [facility] (01/20/2022) for passive SI and assistance with rehab for crack cocaine use.  Patient was then IVC by EDP, which was rescinded on 01/21/2022 by Jearld Fenton, MD.  He was then admitted Voluntary to [facility] (01/21/2022) via EMS for assistance with residential placement.     Clinical Course as of 01/29/22 0844  Tue Jan 22, 2022  0850 TSH(!): 0.248 [JN]  0850 Hemoglobin A1C(!): 10.4 [JN]  Wed Jan 23, 2022  5409 T4,Free(Direct): 0.72 [JN]  Thu Jan 24, 2022  1420 Triiodothyronine,Free,Serum: 2.2 [JN]  Tue Jan 29, 2022  8119 Triiodothyronine,Free,Serum: 2.2 [JN]    Clinical Course User Index [JN] Princess Bruins, DO   Total Time spent with patient: 30 minutes Recommendations  Based on my evaluation the patient does not appear to have an emergency medical condition.  Labs at [facility]: UDS+cocaine. CBC showed plt 444, otherwise within normal limits CMP showed glucose of 360, otherwise unremarkable.  AST/ALT wnl. BAL <10, salicylate level <7  Additional labs: Lab Orders         Hemoglobin A1c         TSH         RPR         HIV Antibody (routine testing w rflx)         Hepatitis panel, acute         Glucose, capillary     Meds ordered this encounter  Medications  . acetaminophen (TYLENOL) tablet 650 mg  . alum & mag hydroxide-simeth (MAALOX/MYLANTA) 200-200-20 MG/5ML suspension 30 mL  . magnesium hydroxide (MILK OF MAGNESIA) suspension 30 mL  . nicotine (NICODERM CQ - dosed in mg/24 hours) patch 21 mg  . nicotine polacrilex (NICORETTE)  gum 4 mg  . amLODipine (NORVASC) tablet 10 mg  . atorvastatin (LIPITOR) tablet 40 mg  . DULoxetine (CYMBALTA) DR capsule 60 mg  . empagliflozin (JARDIANCE) tablet 10 mg  . gabapentin (NEURONTIN) capsule 600 mg  . glipiZIDE (GLUCOTROL XL) 24 hr tablet 10 mg  . insulin glargine (LANTUS) injection 24 Units  . insulin aspart (novoLOG) injection 5 Units  . metFORMIN (GLUCOPHAGE) tablet 1,000 mg  . tamsulosin (FLOMAX) capsule 0.4 mg  . insulin aspart (novoLOG) injection 0-6 Units    Order Specific Question:   Correction coverage:    Answer:   Very Sensitive (ESRD/Dialysis)    Order Specific Question:  CBG < 70:    Answer:   Implement Hypoglycemia Standing Orders and refer to Hypoglycemia Standing Orders sidebar report    Order Specific Question:   CBG 70 - 120:    Answer:   0 units    Order Specific Question:   CBG 121 - 150:    Answer:   0 units    Order Specific Question:   CBG 151 - 200:    Answer:   1 unit    Order Specific Question:   CBG 201-250:    Answer:   2 units    Order Specific Question:   CBG 251-300:    Answer:   3 units    Order Specific Question:   CBG 301-350:    Answer:   4 units    Order Specific Question:   CBG 351-400:    Answer:   5 units    Order Specific Question:   CBG > 400    Answer:   Give 6 units and call MD     Dispo:  Residential rehab Barrier: hyperglycemia, bed availability  Signed: Princess Bruins, DO Psychiatry Resident, PGY-2 [facility] 01/21/2022, 6:47 PM

## 2022-01-21 NOTE — ED Notes (Signed)
Snacks given 

## 2022-01-21 NOTE — ED Notes (Addendum)
Evette Georges, NP made aware of pt's CBG 289. No further orders received.

## 2022-01-21 NOTE — ED Notes (Signed)
Pt sleeping. No noted resp distress. Will continue to monitor for safety 

## 2022-01-21 NOTE — ED Notes (Addendum)
Staffing called about getting a replacement sitter for pt as she is currently leaving.  David Wyatt and Reggie in staffing stated they currently do not have anyone available at this time.  Charge RN today Gilberto Better notified and aware as well.  No new orders at this time.

## 2022-01-21 NOTE — ED Notes (Signed)
NP at [facility] has said the patient's BS has to be under 250 for 24 hours before consideration for admission - RN made aware - admission to [facility] has to be cancelled - will continue to eval for [facility] after BS treated

## 2022-01-21 NOTE — ED Notes (Signed)
Pt has sitter at bedside

## 2022-01-22 ENCOUNTER — Encounter (HOSPITAL_COMMUNITY): Payer: Self-pay

## 2022-01-22 DIAGNOSIS — I10 Essential (primary) hypertension: Secondary | ICD-10-CM | POA: Diagnosis not present

## 2022-01-22 DIAGNOSIS — Z720 Tobacco use: Secondary | ICD-10-CM | POA: Diagnosis not present

## 2022-01-22 DIAGNOSIS — E785 Hyperlipidemia, unspecified: Secondary | ICD-10-CM | POA: Diagnosis not present

## 2022-01-22 DIAGNOSIS — F1410 Cocaine abuse, uncomplicated: Secondary | ICD-10-CM | POA: Diagnosis not present

## 2022-01-22 DIAGNOSIS — F1420 Cocaine dependence, uncomplicated: Secondary | ICD-10-CM | POA: Diagnosis not present

## 2022-01-22 LAB — GLUCOSE, CAPILLARY
Glucose-Capillary: 137 mg/dL — ABNORMAL HIGH (ref 70–99)
Glucose-Capillary: 297 mg/dL — ABNORMAL HIGH (ref 70–99)
Glucose-Capillary: 94 mg/dL (ref 70–99)

## 2022-01-22 MED ORDER — INSULIN GLARGINE-YFGN 100 UNIT/ML ~~LOC~~ SOLN
24.0000 [IU] | Freq: Every day | SUBCUTANEOUS | Status: DC
  Administered 2022-01-22 – 2022-02-06 (×16): 24 [IU] via SUBCUTANEOUS
  Filled 2022-01-22 (×5): qty 10

## 2022-01-22 NOTE — Progress Notes (Signed)
Patient came to nursing station stating he was tremulous and felt his blood sugar was low.  [name] checked and = 59 patient given 2 cups of orange juice and shortly after he stated he felt better.  Thomes Lolling NP made aware.  Will endorse to night shift to retake f.s.

## 2022-01-22 NOTE — ED Provider Notes (Signed)
[facility] Progress Note  Date & Time: 01/22/2022 12:30 PM Name: David Wyatt Age: 61 y.o.  DOB: [birth year]  MRN: [id number]  Diagnosis:  Final diagnoses:  None    Reason for presentation: No chief complaint on file.  Brief HPI  David Wyatt is a 61 y.o. male, with PMH cocaine use disorder, tobacco use disorder, housing instability, DM2 + neuropathy, HTN, HLD, no inpatient psych admission, no suicide attempts, who initially presented to [facility] (01/20/2022) for passive SI and assistance with rehab for crack cocaine use.  Patient was then IVC by EDP, which was rescinded on 01/21/2022 by Mayra Neer, MD.  He was then admitted Voluntary to [facility] (01/21/2022) via EMS for assistance with residential placement.       Interval Hx   Patient Narrative:   Patient reported still feeling tired, same as yesterday.  Reported continued improvement in appetite.  Discussed the patient's tiredness during the day is likely due to high blood sugars.  Patient said that he would make an attempt to cut back on carbs to normalize CBGs.  Discussed with patient that his insulin is a barrier to residential rehab, said he is interested in assistance with housing.  Patient stated that he has no other questions or concerns.  XA:JOINOMVE Thoughts: No (Last time was 11/12.  Contracted to safety.) HM:[id number] Thoughts: No ZMO:[id number]: None (Denied AVH)  Mood:  (Tired) Sleep:Good Appetite: Good Review of Systems  Constitutional:  Positive for malaise/fatigue.  Respiratory:  Negative for shortness of breath.   Cardiovascular:  Negative for chest pain.  Gastrointestinal:  Negative for nausea and vomiting.  Neurological:  Negative for dizziness and headaches.     Past History   Psychiatric History:  Dx: cocaine use disorder, tobacco use disorder Prior Rx: Patient unsure OP psychiatrist: Denied OP therapist: Denied PCP: None Suicide Attempt: Denied  Inpatient  psych: Denied  Psychiatric Family History: Patient unsure  Social History:   Living: Housing instability Income: Disabilities, $900/mo Social support: Sister in [place], none in [place]  EtOH:  reports no history of alcohol use.  Tobacco:  reports that he has been smoking cigarettes. He has a 7.50 pack-year smoking history. He quit smokeless tobacco use about 6 years ago.  Cannabis: Denied Opiates: Denied Stimulants: Crack cocaine - smokes BZO/hypnotics: Denied Seizure/DT: Denied Treatments: Yes IVDU: Denied  Past Medical History:  Past Medical History:  Diagnosis Date   Angioedema of lips 07/28/2012   left upper (07/29/2012)   Arthritis    hands and back   Chronic back pain    Cocaine abuse (HCC)    Diabetic neuropathy (HCC)    High cholesterol    Hypertension    Neuropathy    Type II diabetes mellitus ([place])     Past Surgical History:  Procedure Laterality Date   BACK SURGERY     CYSTOSCOPY W/ URETERAL STENT PLACEMENT Right 02/23/2021   Procedure: CYSTOSCOPY WITH RETROGRADE PYELOGRAM/URETERAL STENT PLACEMENT;  Surgeon: Janith Lima, MD;  Location: [facility];  Service: Urology;  Laterality: Right;   HERNIA REPAIR Right 04/01/2012   I & D EXTREMITY Left 12/13/2012   Procedure: IRRIGATION AND DEBRIDEMENT LEFT THUMB;  Surgeon: Tennis Must, MD;  Location: [facility];  Service: Orthopedics;  Laterality: Left;   INCISION AND DRAINAGE OF WOUND     boil on back/notes 07/14/2008 (07/29/2012)   INGUINAL HERNIA REPAIR  04/01/2012   Procedure: HERNIA REPAIR INGUINAL ADULT;  Surgeon: Haywood Lasso, MD;  Location: [facility]  OR;  Service: General;  Laterality: Right;   INGUINAL HERNIA REPAIR Left 09/06/2016   Procedure: OPEN REPAIR LEFT INGUINAL HERNIA;  Surgeon: Greer Pickerel, MD;  Location: [facility];  Service: General;  Laterality: Left;   INSERTION OF MESH Left 09/06/2016   Procedure: INSERTION OF MESH;  Surgeon: Greer Pickerel, MD;  Location: [facility] OR;  Service: General;  Laterality: Left;   TONSILLECTOMY      Family History:  Family History  Problem Relation Age of Onset   Diabetes Mother    Kidney disease Mother    Hyperlipidemia Mother    Hyperlipidemia Father    Diabetes Father    Social History   Substance and Sexual Activity  Alcohol Use No   Alcohol/week: 0.0 standard drinks of alcohol    Social History   Substance and Sexual Activity  Drug Use Not Currently   Types: "Crack" cocaine    Social History   Socioeconomic History   Marital status: Divorced    Spouse name: Not on file   Number of children: 3   Years of education: 12   Highest education level: Not on file  Occupational History    Comment: disabled  Tobacco Use   Smoking status: Every Day    Packs/day: 0.25    Years: 30.00    Total pack years: 7.50    Types: Cigarettes   Smokeless tobacco: Former    Quit date: 08/15/2015   Tobacco comments:    04/29/16 2  cigs daily, 10/27/17 sometimes < .25 PPD  Vaping Use   Vaping Use: Never used  Substance and Sexual Activity   Alcohol use: No    Alcohol/week: 0.0 standard drinks of alcohol   Drug use: Not Currently    Types: "Crack" cocaine   Sexual activity: Not on file  Other Topics Concern   Not on file  Social History Narrative   04/29/17   Lives in shelter   Caffeine- a lot of  tea, coffee   Social Determinants of Health   Financial Resource Strain: Not on file  Food Insecurity: Not on file  Transportation Needs: Not on file  Physical Activity: Not on file  Stress: Not on file  Social Connections: Not on file   SDOH: SDOH Screenings   Depression (PHQ2-9): High Risk (10/08/2021)  Tobacco Use: High Risk (01/20/2022)   Additional Social History:   Current Medications   Current Facility-Administered Medications  Medication Dose Route Frequency Provider Last Rate Last Admin   acetaminophen (TYLENOL) tablet 650 mg  650 mg Oral Q6H PRN Merrily Brittle, DO       alum & mag hydroxide-simeth (MAALOX/MYLANTA) 200-200-20 MG/5ML suspension 30 mL  30 mL Oral  Q4H PRN Merrily Brittle, DO       amLODipine (NORVASC) tablet 10 mg  10 mg Oral Daily Merrily Brittle, DO   10 mg at 01/22/22 0949   atorvastatin (LIPITOR) tablet 40 mg  40 mg Oral Daily Merrily Brittle, DO   40 mg at 01/22/22 0949   DULoxetine (CYMBALTA) DR capsule 60 mg  60 mg Oral Daily Merrily Brittle, DO   60 mg at 01/22/22 0949   empagliflozin (JARDIANCE) tablet 10 mg  10 mg Oral QAC breakfast Merrily Brittle, DO   10 mg at 01/22/22 0950   gabapentin (NEURONTIN) capsule 600 mg  600 mg Oral TID Merrily Brittle, DO   600 mg at 01/22/22 0949   glipiZIDE (GLUCOTROL XL) 24 hr tablet 10 mg  10 mg Oral Q breakfast Merrily Brittle,  DO   10 mg at 01/22/22 0813   insulin aspart (novoLOG) injection 0-6 Units  0-6 Units Subcutaneous TID WC Merrily Brittle, DO       insulin aspart (novoLOG) injection 5 Units  5 Units Subcutaneous BID AC Merrily Brittle, DO   5 Units at 01/22/22 1202   insulin glargine-yfgn (SEMGLEE) injection 24 Units  24 Units Subcutaneous Daily Minda Ditto, RPH   24 Units at 01/22/22 1002   magnesium hydroxide (MILK OF MAGNESIA) suspension 30 mL  30 mL Oral Daily PRN Merrily Brittle, DO       metFORMIN (GLUCOPHAGE) tablet 1,000 mg  1,000 mg Oral BID WC Merrily Brittle, DO   1,000 mg at 01/22/22 0813   nicotine (NICODERM CQ - dosed in mg/24 hours) patch 21 mg  21 mg Transdermal Q0600 Merrily Brittle, DO   21 mg at 01/22/22 0537   nicotine polacrilex (NICORETTE) gum 4 mg  4 mg Oral PRN Merrily Brittle, DO       tamsulosin [facility]) capsule 0.4 mg  0.4 mg Oral Daily Merrily Brittle, DO   0.4 mg at 01/22/22 5625   Current Outpatient Medications  Medication Sig Dispense Refill   Accu-Chek Softclix Lancets lancets Use up to 4 times daily as directed 100 each 0   amLODipine (NORVASC) 10 MG tablet Take 1 tablet (10 mg total) by mouth daily. (Patient not taking: Reported on 01/20/2022) 30 tablet 6   atorvastatin (LIPITOR) 40 MG tablet Take 1 tablet (40 mg total) by mouth daily. (Patient not taking: Reported on  01/20/2022) 30 tablet 6   Blood Glucose Monitoring Suppl (ACCU-CHEK GUIDE) w/Device KIT USE AS DIRECTED TO CHECK BLOOD SUGARS UP TO TWICE PER DAY 1 kit 0   DULoxetine (CYMBALTA) 60 MG capsule Take 1 capsule (60 mg total) by mouth daily. (Patient not taking: Reported on 01/20/2022) 30 capsule 6   empagliflozin (JARDIANCE) 10 MG TABS tablet Take 1 tablet (10 mg total) by mouth daily before breakfast. (Patient not taking: Reported on 01/20/2022) 30 tablet 6   gabapentin (NEURONTIN) 300 MG capsule Take 2 capsules (600 mg total) by mouth 3 (three) times daily. (Patient not taking: Reported on 01/20/2022) 180 capsule 6   glipiZIDE (GLUCOTROL XL) 10 MG 24 hr tablet Take 1 tablet (10 mg total) by mouth daily with breakfast. (Patient not taking: Reported on 01/20/2022) 30 tablet 6   glucose blood (ACCU-CHEK GUIDE) test strip Use up to 3 times daily as directed 100 each 3   hydrOXYzine (ATARAX) 25 MG tablet Take 1 tablet (25 mg total) by mouth at bedtime. (Patient not taking: Reported on 01/20/2022) 30 tablet 2   Insulin Glargine Solostar (LANTUS) 100 UNIT/ML Solostar Pen Inject 24 Units into the skin daily. (Patient not taking: Reported on 01/20/2022) 15 mL 2   insulin lispro (HUMALOG) 100 UNIT/ML KwikPen Inject 5 Units into the skin 2 (two) times daily before lunch and supper. (Patient not taking: Reported on 01/20/2022) 6 mL 6   Insulin Pen Needle 31G X 8 MM MISC Use with insulin pen 100 each 1   metFORMIN (GLUCOPHAGE) 1000 MG tablet Take 1 tablet (1,000 mg total) by mouth 2 (two) times daily. (Patient not taking: Reported on 01/20/2022) 60 tablet 6   methocarbamol (ROBAXIN) 500 MG tablet Take 1 tablet (500 mg total) by mouth 3 times/day as needed-between meals & bedtime for muscle spasms. (Patient not taking: Reported on 01/20/2022) 20 tablet 0   tamsulosin (FLOMAX) 0.4 MG CAPS capsule Take 1 capsule (0.4  mg total) by mouth daily. For renal calculi (Patient not taking: Reported on 01/20/2022) 30 capsule 3     Labs / Images  Lab Results:  Admission on 01/21/2022  Component Date Value Ref Range Status   Hgb A1c MFr Bld 01/21/2022 10.4 (H)  4.8 - 5.6 % Final   Comment: (NOTE) Pre diabetes:          5.7%-6.4%  Diabetes:              >6.4%  Glycemic control for   <7.0% adults with diabetes    Mean Plasma Glucose 01/21/2022 251.78  mg/dL Final   Performed at [facility], [place] [address] [place], [place] [address]   TSH 01/21/2022 0.248 (L)  0.350 - 4.500 uIU/mL Final   Comment: Performed by a 3rd Generation assay with a functional sensitivity of <=0.01 uIU/mL. Performed at [facility], [place] [address], [place], [place] [address]    Glucose-Capillary 01/21/2022 374 (H)  70 - 99 mg/dL Final   Glucose reference range applies only to samples taken after fasting for at least 8 hours.   Glucose-Capillary 01/21/2022 289 (H)  70 - 99 mg/dL Final   Glucose reference range applies only to samples taken after fasting for at least 8 hours.   Glucose-Capillary 01/22/2022 137 (H)  70 - 99 mg/dL Final   Glucose reference range applies only to samples taken after fasting for at least 8 hours.   Glucose-Capillary 01/22/2022 297 (H)  70 - 99 mg/dL Final   Glucose reference range applies only to samples taken after fasting for at least 8 hours.  Admission on 01/20/2022, Discharged on 01/21/2022  Component Date Value Ref Range Status   SARS Coronavirus 2 by RT PCR 01/20/2022 NEGATIVE  NEGATIVE Final   Comment: (NOTE) SARS-CoV-2 target nucleic acids are NOT DETECTED.  The SARS-CoV-2 RNA is generally detectable in upper respiratory specimens during the acute phase of infection. The lowest concentration of SARS-CoV-2 viral copies this assay can detect is 138 copies/mL. A negative result does not preclude SARS-Cov-2 infection and should not be used as the sole basis for treatment or other patient management decisions. A negative result may occur with  improper specimen collection/handling,  submission of specimen other than nasopharyngeal swab, presence of viral mutation(s) within the areas targeted by this assay, and inadequate number of viral copies(<138 copies/mL). A negative result must be combined with clinical observations, patient history, and epidemiological information. The expected result is Negative.  Fact Sheet for Patients:  EntrepreneurPulse.com.au  Fact Sheet for Healthcare Providers:  IncredibleEmployment.be  This test is no                          t yet approved or cleared by the [place] FDA and  has been authorized for detection and/or diagnosis of SARS-CoV-2 by FDA under an Emergency Use Authorization (EUA). This EUA will remain  in effect (meaning this test can be used) for the duration of the COVID-19 declaration under Section 564(b)(1) of the Act, 21 U.S.C.section 360bbb-3(b)(1), unless the authorization is terminated  or revoked sooner.       Influenza A by PCR 01/20/2022 NEGATIVE  NEGATIVE Final   Influenza B by PCR 01/20/2022 NEGATIVE  NEGATIVE Final   Comment: (NOTE) The Xpert Xpress SARS-CoV-2/FLU/RSV plus assay is intended as an aid in the diagnosis of influenza from Nasopharyngeal swab specimens and should not be used as a sole basis for treatment.  Nasal washings and aspirates are unacceptable for Xpert Xpress SARS-CoV-2/FLU/RSV testing.  Fact Sheet for Patients: EntrepreneurPulse.com.au  Fact Sheet for Healthcare Providers: IncredibleEmployment.be  This test is not yet approved or cleared by the [place] FDA and has been authorized for detection and/or diagnosis of SARS-CoV-2 by FDA under an Emergency Use Authorization (EUA). This EUA will remain in effect (meaning this test can be used) for the duration of the COVID-19 declaration under Section 564(b)(1) of the Act, 21 U.S.C. section 360bbb-3(b)(1), unless the authorization is terminated  or revoked.  Performed at [facility], [place] [address], [place], [place] [address]    Sodium 01/20/2022 136  135 - 145 mmol/L Final   Potassium 01/20/2022 4.0  3.5 - 5.1 mmol/L Final   Chloride 01/20/2022 103  98 - 111 mmol/L Final   CO2 01/20/2022 24  22 - 32 mmol/L Final   Glucose, Bld 01/20/2022 360 (H)  70 - 99 mg/dL Final   Glucose reference range applies only to samples taken after fasting for at least 8 hours.   BUN 01/20/2022 12  8 - 23 mg/dL Final   Creatinine, Ser 01/20/2022 1.01  0.61 - 1.24 mg/dL Final   Calcium 01/20/2022 9.4  8.9 - 10.3 mg/dL Final   Total Protein 01/20/2022 7.3  6.5 - 8.1 g/dL Final   Albumin 01/20/2022 3.4 (L)  3.5 - 5.0 g/dL Final   AST 01/20/2022 14 (L)  15 - 41 U/L Final   ALT 01/20/2022 10  0 - 44 U/L Final   Alkaline Phosphatase 01/20/2022 101  38 - 126 U/L Final   Total Bilirubin 01/20/2022 0.3  0.3 - 1.2 mg/dL Final   GFR, Estimated 01/20/2022 >60  >60 mL/min Final   Comment: (NOTE) Calculated using the CKD-EPI Creatinine Equation (2021)    Anion gap 01/20/2022 9  5 - 15 Final   Performed at [facility] [address], [place], [place] [address]   Alcohol, Ethyl (B) 01/20/2022 <10  <10 mg/dL Final   Comment: (NOTE) Lowest detectable limit for serum alcohol is 10 mg/dL.  For medical purposes only. Performed at [facility], [place] [address], [place],  [address]    Opiates 01/20/2022 NONE DETECTED  NONE DETECTED Final   Cocaine 01/20/2022 POSITIVE (A)  NONE DETECTED Final   Benzodiazepines 01/20/2022 NONE DETECTED  NONE DETECTED Final   Amphetamines 01/20/2022 NONE DETECTED  NONE DETECTED Final   Tetrahydrocannabinol 01/20/2022 NONE DETECTED  NONE DETECTED Final   Barbiturates 01/20/2022 NONE DETECTED  NONE DETECTED Final   Comment: (NOTE) DRUG SCREEN FOR MEDICAL PURPOSES ONLY.  IF CONFIRMATION IS NEEDED FOR ANY PURPOSE, NOTIFY LAB WITHIN 5 DAYS.  LOWEST DETECTABLE LIMITS FOR URINE DRUG SCREEN Drug Class                      Cutoff (ng/mL) Amphetamine and metabolites    1000 Barbiturate and metabolites    200 Benzodiazepine                 200 Opiates and metabolites        300 Cocaine and metabolites        300 THC                            50 Performed at [facility], [place] [address] [place], [place] [address]    WBC 01/20/2022 5.2  4.0 - 10.5  K/uL Final   RBC 01/20/2022 4.73  4.22 - 5.81 MIL/uL Final   Hemoglobin 01/20/2022 14.3  13.0 - 17.0 g/dL Final   HCT 01/20/2022 43.1  39.0 - 52.0 % Final   MCV 01/20/2022 91.1  80.0 - 100.0 fL Final   MCH 01/20/2022 30.2  26.0 - 34.0 pg Final   MCHC 01/20/2022 33.2  30.0 - 36.0 g/dL Final   RDW 01/20/2022 14.1  11.5 - 15.5 % Final   Platelets 01/20/2022 444 (H)  150 - 400 K/uL Final   nRBC 01/20/2022 0.0  0.0 - 0.2 % Final   Neutrophils Relative % 01/20/2022 61  % Final   Neutro Abs 01/20/2022 3.2  1.7 - 7.7 K/uL Final   Lymphocytes Relative 01/20/2022 29  % Final   Lymphs Abs 01/20/2022 1.5  0.7 - 4.0 K/uL Final   Monocytes Relative 01/20/2022 7  % Final   Monocytes Absolute 01/20/2022 0.4  0.1 - 1.0 K/uL Final   Eosinophils Relative 01/20/2022 2  % Final   Eosinophils Absolute 01/20/2022 0.1  0.0 - 0.5 K/uL Final   Basophils Relative 01/20/2022 1  % Final   Basophils Absolute 01/20/2022 0.0  0.0 - 0.1 K/uL Final   Immature Granulocytes 01/20/2022 0  % Final   Abs Immature Granulocytes 01/20/2022 0.01  0.00 - 0.07 K/uL Final   Performed at [facility], [place] [address], [place], [place] [address]   Salicylate Lvl 63/03/6008 <7.0 (L)  7.0 - 30.0 mg/dL Final   Performed at [facility] [address], [place], [place] [address]   Acetaminophen (Tylenol), Serum 01/20/2022 <10 (L)  10 - 30 ug/mL Final   Comment: (NOTE) Therapeutic concentrations vary significantly. A range of 10-30 ug/mL  may be an effective concentration for many patients. However, some  are best treated at concentrations outside of this  range. Acetaminophen concentrations >150 ug/mL at 4 hours after ingestion  and >50 ug/mL at 12 hours after ingestion are often associated with  toxic reactions.  Performed at [facility], [place] [address], [place], [place] [address]    Glucose-Capillary 01/20/2022 344 (H)  70 - 99 mg/dL Final   Glucose reference range applies only to samples taken after fasting for at least 8 hours.   Comment 1 01/20/2022 Notify RN   Final   Comment 2 01/20/2022 Document in Chart   Final   Glucose-Capillary 01/20/2022 479 (H)  70 - 99 mg/dL Final   Glucose reference range applies only to samples taken after fasting for at least 8 hours.   Glucose-Capillary 01/21/2022 243 (H)  70 - 99 mg/dL Final   Glucose reference range applies only to samples taken after fasting for at least 8 hours.   Glucose-Capillary 01/21/2022 224 (H)  70 - 99 mg/dL Final   Glucose reference range applies only to samples taken after fasting for at least 8 hours.   Glucose-Capillary 01/21/2022 282 (H)  70 - 99 mg/dL Final   Glucose reference range applies only to samples taken after fasting for at least 8 hours.  Admission on 01/18/2022, Discharged on 01/19/2022  Component Date Value Ref Range Status   WBC 01/18/2022 5.3  4.0 - 10.5 K/uL Final   RBC 01/18/2022 4.47  4.22 - 5.81 MIL/uL Final   Hemoglobin 01/18/2022 13.7  13.0 - 17.0 g/dL Final   HCT 01/18/2022 41.0  39.0 - 52.0 % Final   MCV 01/18/2022 91.7  80.0 - 100.0 fL Final   MCH  01/18/2022 30.6  26.0 - 34.0 pg Final   MCHC 01/18/2022 33.4  30.0 - 36.0 g/dL Final   RDW 01/18/2022 14.2  11.5 - 15.5 % Final   Platelets 01/18/2022 445 (H)  150 - 400 K/uL Final   nRBC 01/18/2022 0.0  0.0 - 0.2 % Final   Performed at [facility], [place] [address], [place], [place] [address]   Sodium 01/18/2022 136  135 - 145 mmol/L Final   Potassium 01/18/2022 4.3  3.5 - 5.1 mmol/L Final   Chloride 01/18/2022 100  98 - 111 mmol/L Final   CO2 01/18/2022 22  22 - 32 mmol/L Final    Glucose, Bld 01/18/2022 499 (H)  70 - 99 mg/dL Final   Glucose reference range applies only to samples taken after fasting for at least 8 hours.   BUN 01/18/2022 9  8 - 23 mg/dL Final   Creatinine, Ser 01/18/2022 1.23  0.61 - 1.24 mg/dL Final   Calcium 01/18/2022 9.1  8.9 - 10.3 mg/dL Final   Total Protein 01/18/2022 6.6  6.5 - 8.1 g/dL Final   Albumin 01/18/2022 3.2 (L)  3.5 - 5.0 g/dL Final   AST 01/18/2022 18  15 - 41 U/L Final   ALT 01/18/2022 9  0 - 44 U/L Final   Alkaline Phosphatase 01/18/2022 95  38 - 126 U/L Final   Total Bilirubin 01/18/2022 0.3  0.3 - 1.2 mg/dL Final   GFR, Estimated 01/18/2022 >60  >60 mL/min Final   Comment: (NOTE) Calculated using the CKD-EPI Creatinine Equation (2021)    Anion gap 01/18/2022 14  5 - 15 Final   Performed at [facility] [address], [place], [place] [address]   Color, Urine 01/18/2022 YELLOW  YELLOW Final   APPearance 01/18/2022 CLEAR  CLEAR Final   Specific Gravity, Urine 01/18/2022 1.025  1.005 - 1.030 Final   pH 01/18/2022 6.0  5.0 - 8.0 Final   Glucose, UA 01/18/2022 >=500 (A)  NEGATIVE mg/dL Final   Hgb urine dipstick 01/18/2022 NEGATIVE  NEGATIVE Final   Bilirubin Urine 01/18/2022 NEGATIVE  NEGATIVE Final   Ketones, ur 01/18/2022 NEGATIVE  NEGATIVE mg/dL Final   Protein, ur 01/18/2022 NEGATIVE  NEGATIVE mg/dL Final   Nitrite 01/18/2022 NEGATIVE  NEGATIVE Final   Leukocytes,Ua 01/18/2022 SMALL (A)  NEGATIVE Final   RBC / HPF 01/18/2022 0-5  0 - 5 RBC/hpf Final   WBC, UA 01/18/2022 21-50  0 - 5 WBC/hpf Final   Bacteria, UA 01/18/2022 RARE (A)  NONE SEEN Final   Squamous Epithelial / LPF 01/18/2022 0-5  0 - 5 Final   Budding Yeast 01/18/2022 PRESENT   Final   Performed at [facility], [place] [address], [place], [place] [address]   Opiates 01/18/2022 NONE DETECTED  NONE DETECTED Final   Cocaine 01/18/2022 POSITIVE (A)  NONE DETECTED Final   Benzodiazepines 01/18/2022 NONE DETECTED  NONE DETECTED Final   Amphetamines  01/18/2022 NONE DETECTED  NONE DETECTED Final   Tetrahydrocannabinol 01/18/2022 NONE DETECTED  NONE DETECTED Final   Barbiturates 01/18/2022 NONE DETECTED  NONE DETECTED Final   Comment: (NOTE) DRUG SCREEN FOR MEDICAL PURPOSES ONLY.  IF CONFIRMATION IS NEEDED FOR ANY PURPOSE, NOTIFY LAB WITHIN 5 DAYS.  LOWEST DETECTABLE LIMITS FOR URINE DRUG SCREEN Drug Class                     Cutoff (ng/mL) Amphetamine and metabolites    1000 Barbiturate and metabolites  200 Benzodiazepine                 200 Opiates and metabolites        300 Cocaine and metabolites        300 THC                            50 Performed at [facility], [place] [address] [place], [place] [address]    Glucose-Capillary 01/18/2022 483 (H)  70 - 99 mg/dL Final   Glucose reference range applies only to samples taken after fasting for at least 8 hours.   Glucose-Capillary 01/19/2022 586 (HH)  70 - 99 mg/dL Final   Glucose reference range applies only to samples taken after fasting for at least 8 hours.   Glucose-Capillary 01/19/2022 456 (H)  70 - 99 mg/dL Final   Glucose reference range applies only to samples taken after fasting for at least 8 hours.   Glucose-Capillary 01/19/2022 515 (HH)  70 - 99 mg/dL Final   Glucose reference range applies only to samples taken after fasting for at least 8 hours.   Comment 1 01/19/2022 Notify RN   Final   Glucose-Capillary 01/19/2022 164 (H)  70 - 99 mg/dL Final   Glucose reference range applies only to samples taken after fasting for at least 8 hours.  Admission on 01/07/2022, Discharged on 01/07/2022  Component Date Value Ref Range Status   Glucose-Capillary 01/07/2022 267 (H)  70 - 99 mg/dL Final   Glucose reference range applies only to samples taken after fasting for at least 8 hours.  Admission on 01/02/2022, Discharged on 01/02/2022  Component Date Value Ref Range Status   WBC 01/02/2022 7.0  4.0 - 10.5 K/uL Final   RBC 01/02/2022 4.54  4.22 - 5.81 MIL/uL Final    Hemoglobin 01/02/2022 13.9  13.0 - 17.0 g/dL Final   HCT 01/02/2022 41.4  39.0 - 52.0 % Final   MCV 01/02/2022 91.2  80.0 - 100.0 fL Final   MCH 01/02/2022 30.6  26.0 - 34.0 pg Final   MCHC 01/02/2022 33.6  30.0 - 36.0 g/dL Final   RDW 01/02/2022 14.1  11.5 - 15.5 % Final   Platelets 01/02/2022 369  150 - 400 K/uL Final   nRBC 01/02/2022 0.0  0.0 - 0.2 % Final   Neutrophils Relative % 01/02/2022 59  % Final   Neutro Abs 01/02/2022 4.1  1.7 - 7.7 K/uL Final   Lymphocytes Relative 01/02/2022 28  % Final   Lymphs Abs 01/02/2022 1.9  0.7 - 4.0 K/uL Final   Monocytes Relative 01/02/2022 8  % Final   Monocytes Absolute 01/02/2022 0.5  0.1 - 1.0 K/uL Final   Eosinophils Relative 01/02/2022 4  % Final   Eosinophils Absolute 01/02/2022 0.3  0.0 - 0.5 K/uL Final   Basophils Relative 01/02/2022 1  % Final   Basophils Absolute 01/02/2022 0.1  0.0 - 0.1 K/uL Final   Immature Granulocytes 01/02/2022 0  % Final   Abs Immature Granulocytes 01/02/2022 0.02  0.00 - 0.07 K/uL Final   Performed at [facility], [place] [address] [place], [place] [address]   Sodium 01/02/2022 138  135 - 145 mmol/L Final   Potassium 01/02/2022 4.1  3.5 - 5.1 mmol/L Final   Chloride 01/02/2022 104  98 - 111 mmol/L Final   CO2 01/02/2022 25  22 - 32 mmol/L Final   Glucose, Bld 01/02/2022 216 (  H)  70 - 99 mg/dL Final   Glucose reference range applies only to samples taken after fasting for at least 8 hours.   BUN 01/02/2022 11  8 - 23 mg/dL Final   Creatinine, Ser 01/02/2022 0.98  0.61 - 1.24 mg/dL Final   Calcium 01/02/2022 9.5  8.9 - 10.3 mg/dL Final   Total Protein 01/02/2022 7.4  6.5 - 8.1 g/dL Final   Albumin 01/02/2022 3.4 (L)  3.5 - 5.0 g/dL Final   AST 01/02/2022 12 (L)  15 - 41 U/L Final   ALT 01/02/2022 11  0 - 44 U/L Final   Alkaline Phosphatase 01/02/2022 90  38 - 126 U/L Final   Total Bilirubin 01/02/2022 0.9  0.3 - 1.2 mg/dL Final   GFR, Estimated 01/02/2022 >60  >60 mL/min Final   Comment:  (NOTE) Calculated using the CKD-EPI Creatinine Equation (2021)    Anion gap 01/02/2022 9  5 - 15 Final   Performed at [place] [address], [place],  [address]  [facility] on 11/01/2021  Component Date Value Ref Range Status   POC Glucose 11/27/2021 301 (A)  70 - 99 mg/dl Final   1 hr after lunch, had not taken insulin  Admission on 10/13/2021, Discharged on 10/15/2021  Component Date Value Ref Range Status   Glucose-Capillary 10/13/2021 388 (H)  70 - 99 mg/dL Final   Glucose reference range applies only to samples taken after fasting for at least 8 hours.   SARS Coronavirus 2 by RT PCR 10/13/2021 NEGATIVE  NEGATIVE Final   Comment: (NOTE) SARS-CoV-2 target nucleic acids are NOT DETECTED.  The SARS-CoV-2 RNA is generally detectable in upper respiratory specimens during the acute phase of infection. The lowest concentration of SARS-CoV-2 viral copies this assay can detect is 138 copies/mL. A negative result does not preclude SARS-Cov-2 infection and should not be used as the sole basis for treatment or other patient management decisions. A negative result may occur with  improper specimen collection/handling, submission of specimen other than nasopharyngeal swab, presence of viral mutation(s) within the areas targeted by this assay, and inadequate number of viral copies(<138 copies/mL). A negative result must be combined with clinical observations, patient history, and epidemiological information. The expected result is Negative.  Fact Sheet for Patients:  EntrepreneurPulse.com.au  Fact Sheet for Healthcare Providers:  IncredibleEmployment.be  This test is no                          t yet approved or cleared by the [place] FDA and  has been authorized for detection and/or diagnosis of SARS-CoV-2 by FDA under an Emergency Use Authorization (EUA). This EUA will remain  in effect (meaning this test  can be used) for the duration of the COVID-19 declaration under Section 564(b)(1) of the Act, 21 U.S.C.section 360bbb-3(b)(1), unless the authorization is terminated  or revoked sooner.       Influenza A by PCR 10/13/2021 NEGATIVE  NEGATIVE Final   Influenza B by PCR 10/13/2021 NEGATIVE  NEGATIVE Final   Comment: (NOTE) The Xpert Xpress SARS-CoV-2/FLU/RSV plus assay is intended as an aid in the diagnosis of influenza from Nasopharyngeal swab specimens and should not be used as a sole basis for treatment. Nasal washings and aspirates are unacceptable for Xpert Xpress SARS-CoV-2/FLU/RSV testing.  Fact Sheet for Patients: EntrepreneurPulse.com.au  Fact Sheet for Healthcare Providers: IncredibleEmployment.be  This test is not yet approved or cleared by the [place]  States FDA and has been authorized for detection and/or diagnosis of SARS-CoV-2 by FDA under an Emergency Use Authorization (EUA). This EUA will remain in effect (meaning this test can be used) for the duration of the COVID-19 declaration under Section 564(b)(1) of the Act, 21 U.S.C. section 360bbb-3(b)(1), unless the authorization is terminated or revoked.  Performed at [facility], [place] [address], [place], [place] [address]    WBC 10/13/2021 9.5  4.0 - 10.5 K/uL Final   RBC 10/13/2021 4.23  4.22 - 5.81 MIL/uL Final   Hemoglobin 10/13/2021 13.0  13.0 - 17.0 g/dL Final   HCT 10/13/2021 40.4  39.0 - 52.0 % Final   MCV 10/13/2021 95.5  80.0 - 100.0 fL Final   MCH 10/13/2021 30.7  26.0 - 34.0 pg Final   MCHC 10/13/2021 32.2  30.0 - 36.0 g/dL Final   RDW 10/13/2021 13.4  11.5 - 15.5 % Final   Platelets 10/13/2021 551 (H)  150 - 400 K/uL Final   nRBC 10/13/2021 0.0  0.0 - 0.2 % Final   Neutrophils Relative % 10/13/2021 68  % Final   Neutro Abs 10/13/2021 6.5  1.7 - 7.7 K/uL Final   Lymphocytes Relative 10/13/2021 18  % Final   Lymphs Abs 10/13/2021 1.7  0.7 - 4.0 K/uL Final    Monocytes Relative 10/13/2021 9  % Final   Monocytes Absolute 10/13/2021 0.8  0.1 - 1.0 K/uL Final   Eosinophils Relative 10/13/2021 3  % Final   Eosinophils Absolute 10/13/2021 0.3  0.0 - 0.5 K/uL Final   Basophils Relative 10/13/2021 1  % Final   Basophils Absolute 10/13/2021 0.1  0.0 - 0.1 K/uL Final   Immature Granulocytes 10/13/2021 1  % Final   Abs Immature Granulocytes 10/13/2021 0.11 (H)  0.00 - 0.07 K/uL Final   Performed at [facility], [place] [address] [place], [place] [address]   Sodium 10/13/2021 136  135 - 145 mmol/L Final   Potassium 10/13/2021 4.3  3.5 - 5.1 mmol/L Final   Chloride 10/13/2021 104  98 - 111 mmol/L Final   CO2 10/13/2021 24  22 - 32 mmol/L Final   Glucose, Bld 10/13/2021 296 (H)  70 - 99 mg/dL Final   Glucose reference range applies only to samples taken after fasting for at least 8 hours.   BUN 10/13/2021 14  8 - 23 mg/dL Final   Creatinine, Ser 10/13/2021 1.15  0.61 - 1.24 mg/dL Final   Calcium 10/13/2021 9.0  8.9 - 10.3 mg/dL Final   Total Protein 10/13/2021 7.5  6.5 - 8.1 g/dL Final   Albumin 10/13/2021 3.0 (L)  3.5 - 5.0 g/dL Final   AST 10/13/2021 13 (L)  15 - 41 U/L Final   ALT 10/13/2021 10  0 - 44 U/L Final   Alkaline Phosphatase 10/13/2021 84  38 - 126 U/L Final   Total Bilirubin 10/13/2021 0.5  0.3 - 1.2 mg/dL Final   GFR, Estimated 10/13/2021 >60  >60 mL/min Final   Comment: (NOTE) Calculated using the CKD-EPI Creatinine Equation (2021)    Anion gap 10/13/2021 8  5 - 15 Final   Performed at [facility], [place] [address] [place], [place] [address]   Color, Urine 10/13/2021 YELLOW  YELLOW Final   APPearance 10/13/2021 CLOUDY (A)  CLEAR Final   Specific Gravity, Urine 10/13/2021 1.022  1.005 - 1.030 Final   pH 10/13/2021 5.0  5.0 - 8.0 Final   Glucose, UA 10/13/2021 >=500 (  A)  NEGATIVE mg/dL Final   Hgb urine dipstick 10/13/2021 LARGE (A)  NEGATIVE Final   Bilirubin Urine 10/13/2021 NEGATIVE  NEGATIVE Final   Ketones, ur  10/13/2021 NEGATIVE  NEGATIVE mg/dL Final   Protein, ur 10/13/2021 30 (A)  NEGATIVE mg/dL Final   Nitrite 10/13/2021 NEGATIVE  NEGATIVE Final   Leukocytes,Ua 10/13/2021 LARGE (A)  NEGATIVE Final   RBC / HPF 10/13/2021 >50 (H)  0 - 5 RBC/hpf Final   WBC, UA 10/13/2021 >50 (H)  0 - 5 WBC/hpf Final   Bacteria, UA 10/13/2021 RARE (A)  NONE SEEN Final   Squamous Epithelial / LPF 10/13/2021 0-5  0 - 5 Final   WBC Clumps 10/13/2021 PRESENT   Final   Mucus 10/13/2021 PRESENT   Final   Budding Yeast 10/13/2021 PRESENT   Final   Non Squamous Epithelial 10/13/2021 0-5 (A)  NONE SEEN Final   Performed at [facility], [place] [address], [place], [place] [address]   pH, Ven 10/13/2021 7.457 (H)  7.25 - 7.43 Final   pCO2, Ven 10/13/2021 38.1 (L)  44 - 60 mmHg Final   pO2, Ven 10/13/2021 174 (H)  32 - 45 mmHg Final   Bicarbonate 10/13/2021 26.9  20.0 - 28.0 mmol/L Final   TCO2 10/13/2021 28  22 - 32 mmol/L Final   O2 Saturation 10/13/2021 100  % Final   Acid-Base Excess 10/13/2021 3.0 (H)  0.0 - 2.0 mmol/L Final   Sodium 10/13/2021 137  135 - 145 mmol/L Final   Potassium 10/13/2021 4.3  3.5 - 5.1 mmol/L Final   Calcium, Ion 10/13/2021 1.10 (L)  1.15 - 1.40 mmol/L Final   HCT 10/13/2021 40.0  39.0 - 52.0 % Final   Hemoglobin 10/13/2021 13.6  13.0 - 17.0 g/dL Final   Sample type 10/13/2021 VENOUS   Final   Opiates 10/13/2021 NONE DETECTED  NONE DETECTED Final   Cocaine 10/13/2021 POSITIVE (A)  NONE DETECTED Final   Benzodiazepines 10/13/2021 NONE DETECTED  NONE DETECTED Final   Amphetamines 10/13/2021 NONE DETECTED  NONE DETECTED Final   Tetrahydrocannabinol 10/13/2021 NONE DETECTED  NONE DETECTED Final   Barbiturates 10/13/2021 NONE DETECTED  NONE DETECTED Final   Comment: (NOTE) DRUG SCREEN FOR MEDICAL PURPOSES ONLY.  IF CONFIRMATION IS NEEDED FOR ANY PURPOSE, NOTIFY LAB WITHIN 5 DAYS.  LOWEST DETECTABLE LIMITS FOR URINE DRUG SCREEN Drug Class                     Cutoff  (ng/mL) Amphetamine and metabolites    1000 Barbiturate and metabolites    200 Benzodiazepine                 264 Tricyclics and metabolites     300 Opiates and metabolites        300 Cocaine and metabolites        300 THC                            50 Performed at [facility], [place] [address], [place],  [address]    Adenovirus 10/13/2021 NOT DETECTED  NOT DETECTED Final   Coronavirus 229E 10/13/2021 NOT DETECTED  NOT DETECTED Final   Comment: (NOTE) The Coronavirus on the Respiratory Panel, DOES NOT test for the novel  Coronavirus (2019 nCoV)    Coronavirus HKU1 10/13/2021 NOT DETECTED  NOT DETECTED Final   Coronavirus NL63 10/13/2021 NOT DETECTED  NOT DETECTED  Final   Coronavirus OC43 10/13/2021 NOT DETECTED  NOT DETECTED Final   Metapneumovirus 10/13/2021 NOT DETECTED  NOT DETECTED Final   Rhinovirus / Enterovirus 10/13/2021 NOT DETECTED  NOT DETECTED Final   Influenza A 10/13/2021 NOT DETECTED  NOT DETECTED Final   Influenza B 10/13/2021 NOT DETECTED  NOT DETECTED Final   Parainfluenza Virus 1 10/13/2021 NOT DETECTED  NOT DETECTED Final   Parainfluenza Virus 2 10/13/2021 NOT DETECTED  NOT DETECTED Final   Parainfluenza Virus 3 10/13/2021 NOT DETECTED  NOT DETECTED Final   Parainfluenza Virus 4 10/13/2021 NOT DETECTED  NOT DETECTED Final   Respiratory Syncytial Virus 10/13/2021 NOT DETECTED  NOT DETECTED Final   Bordetella pertussis 10/13/2021 NOT DETECTED  NOT DETECTED Final   Bordetella Parapertussis 10/13/2021 NOT DETECTED  NOT DETECTED Final   Chlamydophila pneumoniae 10/13/2021 NOT DETECTED  NOT DETECTED Final   Mycoplasma pneumoniae 10/13/2021 NOT DETECTED  NOT DETECTED Final   Performed at [facility], [place] [address], [place], [place] [address]   Procalcitonin 10/13/2021 <0.10  ng/mL Final   Comment:        Interpretation: PCT (Procalcitonin) <= 0.5 ng/mL: Systemic infection (sepsis) is not likely. Local bacterial infection is possible. (NOTE)        Sepsis PCT Algorithm           Lower Respiratory Tract                                      Infection PCT Algorithm    ----------------------------     ----------------------------         PCT < 0.25 ng/mL                PCT < 0.10 ng/mL          Strongly encourage             Strongly discourage   discontinuation of antibiotics    initiation of antibiotics    ----------------------------     -----------------------------       PCT 0.25 - 0.50 ng/mL            PCT 0.10 - 0.25 ng/mL               OR       >80% decrease in PCT            Discourage initiation of                                            antibiotics      Encourage discontinuation           of antibiotics    ----------------------------     -----------------------------         PCT >= 0.50 ng/mL              PCT 0.26 - 0.50 ng/mL               AND                                 <80% decrease in PCT             Encourage initiation of  antibiotics       Encourage continuation           of antibiotics    ----------------------------     -----------------------------        PCT >= 0.50 ng/mL                  PCT > 0.50 ng/mL               AND         increase in PCT                  Strongly encourage                                      initiation of antibiotics    Strongly encourage escalation           of antibiotics                                     -----------------------------                                           PCT <= 0.25 ng/mL                                                 OR                                        > 80% decrease in PCT                                      Discontinue / Do not initiate                                             antibiotics  Performed at [facility], [address] [place], [place] [address]    B Natriuretic Peptide 10/13/2021 23.6  0.0 - 100.0 pg/mL Final   Performed at [place] [address], [place], [place] [address]   L. pneumophila Serogp 1 Ur Ag 10/13/2021 Negative  Negative Final   Comment: (NOTE) Presumptive negative for L. pneumophila serogroup 1 antigen in urine, suggesting no recent or current infection. Legionnaires' disease cannot be ruled out since other serogroups and species may also cause disease. Performed At: [facility] [place], [place] [id number] Rush Farmer MD GY:[id number]    Source of Sample 10/13/2021 URINE, RANDOM   Final   Performed at [facility], [place] [address], [place], [place] [address]   Strep Pneumo Urinary Antigen 10/13/2021 NEGATIVE  NEGATIVE Final   Comment:        Infection due to S. pneumoniae cannot be absolutely ruled out since the antigen present may be below  the detection limit of the test. Performed at [facility], [place] [address] [place], [place] [address]    Sodium 10/14/2021 138  135 - 145 mmol/L Final   Potassium 10/14/2021 4.3  3.5 - 5.1 mmol/L Final   Chloride 10/14/2021 106  98 - 111 mmol/L Final   CO2 10/14/2021 25  22 - 32 mmol/L Final   Glucose, Bld 10/14/2021 286 (H)  70 - 99 mg/dL Final   Glucose reference range applies only to samples taken after fasting for at least 8 hours.   BUN 10/14/2021 14  8 - 23 mg/dL Final   Creatinine, Ser 10/14/2021 0.98  0.61 - 1.24 mg/dL Final   Calcium 10/14/2021 8.9  8.9 - 10.3 mg/dL Final   GFR, Estimated 10/14/2021 >60  >60 mL/min Final   Comment: (NOTE) Calculated using the CKD-EPI Creatinine Equation (2021)    Anion gap 10/14/2021 7  5 - 15 Final   Performed at [facility], [place] [address] [place], [place] [address]   WBC 10/14/2021 9.6  4.0 - 10.5 K/uL Final   RBC 10/14/2021 4.12 (L)  4.22 - 5.81 MIL/uL Final   Hemoglobin 10/14/2021 12.6 (L)  13.0 - 17.0 g/dL Final   HCT 10/14/2021 38.8 (L)  39.0 - 52.0 % Final   MCV 10/14/2021 94.2  80.0 - 100.0 fL Final   MCH 10/14/2021 30.6  26.0 - 34.0 pg Final   MCHC 10/14/2021 32.5   30.0 - 36.0 g/dL Final   RDW 10/14/2021 13.4  11.5 - 15.5 % Final   Platelets 10/14/2021 556 (H)  150 - 400 K/uL Final   nRBC 10/14/2021 0.0  0.0 - 0.2 % Final   Performed at [facility], [place] [address], [place], [place] [address]   Glucose-Capillary 10/13/2021 281 (H)  70 - 99 mg/dL Final   Glucose reference range applies only to samples taken after fasting for at least 8 hours.   Glucose-Capillary 10/13/2021 399 (H)  70 - 99 mg/dL Final   Glucose reference range applies only to samples taken after fasting for at least 8 hours.   Glucose-Capillary 10/14/2021 239 (H)  70 - 99 mg/dL Final   Glucose reference range applies only to samples taken after fasting for at least 8 hours.   Glucose-Capillary 10/14/2021 345 (H)  70 - 99 mg/dL Final   Glucose reference range applies only to samples taken after fasting for at least 8 hours.   Glucose-Capillary 10/14/2021 383 (H)  70 - 99 mg/dL Final   Glucose reference range applies only to samples taken after fasting for at least 8 hours.   Glucose-Capillary 10/15/2021 194 (H)  70 - 99 mg/dL Final   Glucose reference range applies only to samples taken after fasting for at least 8 hours.   Glucose-Capillary 10/15/2021 161 (H)  70 - 99 mg/dL Final   Glucose reference range applies only to samples taken after fasting for at least 8 hours.   Glucose-Capillary 10/15/2021 312 (H)  70 - 99 mg/dL Final   Glucose reference range applies only to samples taken after fasting for at least 8 hours.   Glucose-Capillary 10/14/2021 279 (H)  70 - 99 mg/dL Final   Glucose reference range applies only to samples taken after fasting for at least 8 hours.  Office Visit on 10/08/2021  Component Date Value Ref Range Status   POC Glucose 10/08/2021 321 (A)  70 - 99 mg/dl Final   HbA1c, POC (controlled diabetic ra* 10/08/2021  9.3 (A)  0.0 - 7.0 % Final  Admission on 10/07/2021, Discharged on 10/07/2021  Component Date Value Ref Range Status   Color, Urine 10/07/2021  YELLOW  YELLOW Final   APPearance 10/07/2021 HAZY (A)  CLEAR Final   Specific Gravity, Urine 10/07/2021 1.010  1.005 - 1.030 Final   pH 10/07/2021 5.0  5.0 - 8.0 Final   Glucose, UA 10/07/2021 >=500 (A)  NEGATIVE mg/dL Final   Hgb urine dipstick 10/07/2021 MODERATE (A)  NEGATIVE Final   Bilirubin Urine 10/07/2021 NEGATIVE  NEGATIVE Final   Ketones, ur 10/07/2021 NEGATIVE  NEGATIVE mg/dL Final   Protein, ur 10/07/2021 30 (A)  NEGATIVE mg/dL Final   Nitrite 10/07/2021 NEGATIVE  NEGATIVE Final   Leukocytes,Ua 10/07/2021 LARGE (A)  NEGATIVE Final   RBC / HPF 10/07/2021 21-50  0 - 5 RBC/hpf Final   WBC, UA 10/07/2021 >50 (H)  0 - 5 WBC/hpf Final   Bacteria, UA 10/07/2021 NONE SEEN  NONE SEEN Final   WBC Clumps 10/07/2021 PRESENT   Final   Mucus 10/07/2021 PRESENT   Final   Budding Yeast 10/07/2021 PRESENT   Final   Performed at [facility], [place] [address], [place], [place] [address]   WBC 10/07/2021 8.9  4.0 - 10.5 K/uL Final   RBC 10/07/2021 4.35  4.22 - 5.81 MIL/uL Final   Hemoglobin 10/07/2021 13.7  13.0 - 17.0 g/dL Final   HCT 10/07/2021 41.9  39.0 - 52.0 % Final   MCV 10/07/2021 96.3  80.0 - 100.0 fL Final   MCH 10/07/2021 31.5  26.0 - 34.0 pg Final   MCHC 10/07/2021 32.7  30.0 - 36.0 g/dL Final   RDW 10/07/2021 13.4  11.5 - 15.5 % Final   Platelets 10/07/2021 376  150 - 400 K/uL Final   nRBC 10/07/2021 0.0  0.0 - 0.2 % Final   Neutrophils Relative % 10/07/2021 59  % Final   Neutro Abs 10/07/2021 5.2  1.7 - 7.7 K/uL Final   Lymphocytes Relative 10/07/2021 17  % Final   Lymphs Abs 10/07/2021 1.5  0.7 - 4.0 K/uL Final   Monocytes Relative 10/07/2021 19  % Final   Monocytes Absolute 10/07/2021 1.7 (H)  0.1 - 1.0 K/uL Final   Eosinophils Relative 10/07/2021 5  % Final   Eosinophils Absolute 10/07/2021 0.5  0.0 - 0.5 K/uL Final   Basophils Relative 10/07/2021 0  % Final   Basophils Absolute 10/07/2021 0.0  0.0 - 0.1 K/uL Final   Immature Granulocytes 10/07/2021 0   % Final   Abs Immature Granulocytes 10/07/2021 0.04  0.00 - 0.07 K/uL Final   Performed at [facility], [place] [address] [place], [place] [address]   Sodium 10/07/2021 141  135 - 145 mmol/L Final   Potassium 10/07/2021 4.0  3.5 - 5.1 mmol/L Final   Chloride 10/07/2021 107  98 - 111 mmol/L Final   CO2 10/07/2021 24  22 - 32 mmol/L Final   Glucose, Bld 10/07/2021 81  70 - 99 mg/dL Final   Glucose reference range applies only to samples taken after fasting for at least 8 hours.   BUN 10/07/2021 14  8 - 23 mg/dL Final   Creatinine, Ser 10/07/2021 1.19  0.61 - 1.24 mg/dL Final   Calcium 10/07/2021 9.4  8.9 - 10.3 mg/dL Final   Total Protein 10/07/2021 7.9  6.5 - 8.1 g/dL Final   Albumin 10/07/2021 3.5  3.5 - 5.0 g/dL Final   AST 10/07/2021 12 (  L)  15 - 41 U/L Final   ALT 10/07/2021 10  0 - 44 U/L Final   Alkaline Phosphatase 10/07/2021 73  38 - 126 U/L Final   Total Bilirubin 10/07/2021 0.7  0.3 - 1.2 mg/dL Final   GFR, Estimated 10/07/2021 >60  >60 mL/min Final   Comment: (NOTE) Calculated using the CKD-EPI Creatinine Equation (2021)    Anion gap 10/07/2021 10  5 - 15 Final   Performed at [facility], [place] [address], [place], [place] [address]   Specimen Description 10/07/2021    Final                   Value:URINE, CLEAN CATCH Performed at [facility], [place] [address], [place], [place] [address]    Special Requests 10/07/2021    Final                   Value:NONE Performed at [facility], [place] [address], [place], [place] [address]    Culture 10/07/2021  (A)   Final                   Value:40,000 COLONIES/mL YEAST 20,000 COLONIES/mL LACTOBACILLUS SPECIES Standardized susceptibility testing for this organism is not available. Performed at [facility], [place] [address] [place], [place] [address]    Report Status 10/07/2021 10/08/2021 FINAL   Final  Admission on 10/04/2021, Discharged on 10/04/2021   Component Date Value Ref Range Status   WBC 10/04/2021 9.3  4.0 - 10.5 K/uL Final   RBC 10/04/2021 4.26  4.22 - 5.81 MIL/uL Final   Hemoglobin 10/04/2021 13.4  13.0 - 17.0 g/dL Final   HCT 10/04/2021 41.5  39.0 - 52.0 % Final   MCV 10/04/2021 97.4  80.0 - 100.0 fL Final   MCH 10/04/2021 31.5  26.0 - 34.0 pg Final   MCHC 10/04/2021 32.3  30.0 - 36.0 g/dL Final   RDW 10/04/2021 13.6  11.5 - 15.5 % Final   Platelets 10/04/2021 367  150 - 400 K/uL Final   nRBC 10/04/2021 0.0  0.0 - 0.2 % Final   Performed at [facility], [place] [address], [place], [place] [address]   Sodium 10/04/2021 138  135 - 145 mmol/L Final   Potassium 10/04/2021 4.4  3.5 - 5.1 mmol/L Final   Chloride 10/04/2021 107  98 - 111 mmol/L Final   CO2 10/04/2021 22  22 - 32 mmol/L Final   Glucose, Bld 10/04/2021 236 (H)  70 - 99 mg/dL Final   Glucose reference range applies only to samples taken after fasting for at least 8 hours.   BUN 10/04/2021 13  8 - 23 mg/dL Final   Creatinine, Ser 10/04/2021 1.15  0.61 - 1.24 mg/dL Final   Calcium 10/04/2021 8.9  8.9 - 10.3 mg/dL Final   GFR, Estimated 10/04/2021 >60  >60 mL/min Final   Comment: (NOTE) Calculated using the CKD-EPI Creatinine Equation (2021)    Anion gap 10/04/2021 9  5 - 15 Final   Performed at [facility], [place] [address], [place], [place] [address]  There may be more visits with results that are not included.   Blood Alcohol level:  Lab Results  Component Value Date   [facility] <10 01/20/2022   ETH <10 97/35/3299   Metabolic Disorder Labs: Lab Results  Component Value Date   HGBA1C 10.4 (H) 01/21/2022   MPG 251.78 01/21/2022   MPG >398 02/24/2021  No results found for: "PROLACTIN" Lab Results  Component Value Date   CHOL 213 (H) 05/23/2021   TRIG 115 05/23/2021   HDL 71 05/23/2021   CHOLHDL 3.2 11/03/2019   VLDL 32 (H) 05/03/2015   LDLCALC 122 (H) 05/23/2021   LDLCALC 99 11/03/2019   Therapeutic Lab Levels: No  results found for: "LITHIUM" No results found for: "VALPROATE" No results found for: "CBMZ" Physical Findings   GAD-7    Flowsheet Row Office Visit from 10/08/2021 in [place] Office Visit from 09/05/2021 in [place] [facility] Office Visit from 12/26/2020 in [facility] [employer] Office Visit from 11/17/2018 in [place] Office Visit from 06/05/2017 in [place]  Total GAD-7 Score _0 0 12      PHQ2-9    [place] Office Visit from 10/08/2021 in [place] Office Visit from 09/05/2021 in [place] [facility] ED from 07/02/2021 in [facility] Office Visit from 12/26/2020 in [place] Office Visit from 11/17/2018 in [place]  PHQ-2 Total Score _1 0  PHQ-9 Total Score _2 0      Flowsheet Row ED from 01/21/2022 in [facility] ED from 01/20/2022 in [place] [facility] [place] ED from 01/18/2022 in [place] CATEGORY Low Risk Low Risk No Risk       Musculoskeletal  Strength & Muscle Tone: within normal limits Gait & Station: normal Patient leans: forward   Psychiatric Specialty Exam   Presentation  General Appearance:Disheveled Eye Contact:None Speech:Slow (Mostly clear and coherent.  Mumbling at times.) Volume:Decreased Handedness:Right  Mood and Affect  Mood: (Tired) Affect:Appropriate, Congruent, Depressed, Restricted  Thought Process  Thought Process:Coherent, Goal Directed, Linear Descriptions of Associations:Intact  Thought Content Suicidal Thoughts:Suicidal Thoughts: No (Last time was 11/12.  Contracted to safety.) Homicidal Thoughts:Homicidal Thoughts: No Hallucinations:Hallucinations: None (Denied AVH) Ideas  of Reference:None Thought Content:WDL  Sensorium  Memory:Immediate Good Judgment:Fair Insight:Fair  Executive Functions  Orientation:Full (Time, Place and Person) Language:Good Concentration:Good Attention:Good [place] of Knowledge:Good  Psychomotor Activity  Psychomotor Activity:Psychomotor Activity: Psychomotor Retardation  Assets  Assets:Communication Skills, Desire for Improvement, Leisure Time  Sleep  Quality:Good  Physical Exam  BP 120/75 (BP Location: Left Arm)   Pulse 93   Temp 98.4 F (36.9 C) (Oral)   Resp 18   SpO2 99%  Physical Exam Vitals and nursing note reviewed.  Constitutional:      General: He is not in acute distress.    Appearance: He is ill-appearing. He is not toxic-appearing or diaphoretic.  HENT:     Head: Normocephalic.  Pulmonary:     Effort: Pulmonary effort is normal. No respiratory distress.  Skin:    Comments: Lipoma on upper back  Neurological:     Mental Status: He is alert.      Assessment / Plan  Total Time spent with patient: 15 minutes Treatment Plan Summary: Daily contact with patient to assess and evaluate symptoms and progress in treatment and Medication management  Principal Problem:   Cocaine use disorder, severe, dependence (HCC)   David Wyatt is a 61 y.o. male with PMH cocaine use disorder, tobacco use disorder, housing instability, DM2 + neuropathy, HTN, HLD, no inpatient psych admission, no suicide attempts, who initially presented  to [facility] (01/20/2022) for passive SI and assistance with rehab for crack cocaine use.  Patient was then IVC by EDP, which was rescinded on 01/21/2022 by Mayra Neer, MD.  He was then admitted Voluntary to [facility] (01/21/2022) via EMS for assistance with residential placement.      Patient presented as lethargic, but able to attend meals and groups. This was likely due to uncontrolled CGBs, which were initially ~300s. His biggest concern was housing to stay away from cocaine.  Total  duration of encounter: 1 day  Cocaine use d/o  Tobacco use d/o Encouraged cessation Comfort PRNs   Substance induced mood d/o Continued home Cymbalta 60 mg daily  DM2-insulin-dependent, with neuropathy A1c 10.4 (01/21/2022), from 9.7 earlier this year. Goal CBGs <180. CBGs 300, no hypoglycemia  Continued home metformin 1000 mg twice daily Continued home glipizide 10 mg with breakfast Continued home empagliflozin 10 mg with breakfast Continued home insulin Long-acting 24 units daily Sensitive sliding scale Gabapentin 600 mg TID and Cymbalta per above for neuropathy  Low TSH Follow up T3 and T4  Clinical Course as of 01/22/22 1235  Tue Jan 22, 2022  0850 TSH(!): 0.248 [JN]  0850 Hemoglobin A1C(!): 10.4 [JN]    Clinical Course User Index [JN] Merrily Brittle, DO  Labs at [facility]: UDS+cocaine. CBC showed plt 444, otherwise within normal limits CMP showed glucose of 360, otherwise unremarkable.  AST/ALT wnl. BAL <79, salicylate level <7   Considerations for follow-up: Recommend high risk screening every 6-12 months with HIV, RPR, hepatitis panel Recommend monitoring LFT every 6-12 months while on naltrexone PCP for insulin dependent DM2 - uncontrolled  DISPO: Tentative date: TBA Location: [facility] vs. [facility] vs [facility] [employer] Unable to go to residential rehab due to insulin dependency   Signed: Merrily Brittle, DO Psychiatry Resident, PGY-2 01/22/2022, 12:30 PM   [facility] [place], [place] [address] Dept: [phone] Dept Fax: [phone]

## 2022-01-22 NOTE — ED Notes (Signed)
Pt is in the bed resting. Respirations are even and unlabored. No acute distress noted. Will continue to monitor for safety 

## 2022-01-22 NOTE — ED Notes (Signed)
Pt is in the bed sleeping. Respirations are even and unlabored. No acute distress noted. Will continue to monitor for safety. 

## 2022-01-22 NOTE — Progress Notes (Signed)
Patient has not received sliding scale insulin due to not meeting order parameters.  This morning f.s. =137 at lunch f.s. = 97.

## 2022-01-22 NOTE — BH IP Treatment Plan (Addendum)
Interdisciplinary Treatment and Diagnostic Plan Update  01/22/2022 Time of Session: 8:34AM David Wyatt MRN: [id number]  Diagnosis:  Final diagnoses:  None     Current Medications:  Current Facility-Administered Medications  Medication Dose Route Frequency Provider Last Rate Last Admin   acetaminophen (TYLENOL) tablet 650 mg  650 mg Oral Q6H PRN Merrily Brittle, DO       alum & mag hydroxide-simeth (MAALOX/MYLANTA) 200-200-20 MG/5ML suspension 30 mL  30 mL Oral Q4H PRN Merrily Brittle, DO       amLODipine (NORVASC) tablet 10 mg  10 mg Oral Daily Merrily Brittle, DO   10 mg at 01/22/22 0949   atorvastatin (LIPITOR) tablet 40 mg  40 mg Oral Daily Merrily Brittle, DO   40 mg at 01/22/22 0949   DULoxetine (CYMBALTA) DR capsule 60 mg  60 mg Oral Daily Merrily Brittle, DO   60 mg at 01/22/22 0949   empagliflozin (JARDIANCE) tablet 10 mg  10 mg Oral QAC breakfast Merrily Brittle, DO   10 mg at 01/22/22 0950   gabapentin (NEURONTIN) capsule 600 mg  600 mg Oral TID Merrily Brittle, DO   600 mg at 01/22/22 0949   glipiZIDE (GLUCOTROL XL) 24 hr tablet 10 mg  10 mg Oral Q breakfast Merrily Brittle, DO   10 mg at 01/22/22 0813   insulin aspart (novoLOG) injection 0-6 Units  0-6 Units Subcutaneous TID WC Merrily Brittle, DO       insulin aspart (novoLOG) injection 5 Units  5 Units Subcutaneous BID AC Merrily Brittle, DO       insulin glargine-yfgn [facility]) injection 24 Units  24 Units Subcutaneous Daily Minda Ditto, RPH   24 Units at 01/22/22 1002   magnesium hydroxide (MILK OF MAGNESIA) suspension 30 mL  30 mL Oral Daily PRN Merrily Brittle, DO       metFORMIN (GLUCOPHAGE) tablet 1,000 mg  1,000 mg Oral BID WC Merrily Brittle, DO   1,000 mg at 01/22/22 0813   nicotine (NICODERM CQ - dosed in mg/24 hours) patch 21 mg  21 mg Transdermal Q0600 Merrily Brittle, DO   21 mg at 01/22/22 0537   nicotine polacrilex (NICORETTE) gum 4 mg  4 mg Oral PRN Merrily Brittle, DO       tamsulosin [facility]) capsule 0.4 mg  0.4 mg Oral Daily  Merrily Brittle, DO   0.4 mg at 01/22/22 5631   Current Outpatient Medications  Medication Sig Dispense Refill   Accu-Chek Softclix Lancets lancets Use up to 4 times daily as directed 100 each 0   amLODipine (NORVASC) 10 MG tablet Take 1 tablet (10 mg total) by mouth daily. (Patient not taking: Reported on 01/20/2022) 30 tablet 6   atorvastatin (LIPITOR) 40 MG tablet Take 1 tablet (40 mg total) by mouth daily. (Patient not taking: Reported on 01/20/2022) 30 tablet 6   Blood Glucose Monitoring Suppl (ACCU-CHEK GUIDE) w/Device KIT USE AS DIRECTED TO CHECK BLOOD SUGARS UP TO TWICE PER DAY 1 kit 0   DULoxetine (CYMBALTA) 60 MG capsule Take 1 capsule (60 mg total) by mouth daily. (Patient not taking: Reported on 01/20/2022) 30 capsule 6   empagliflozin (JARDIANCE) 10 MG TABS tablet Take 1 tablet (10 mg total) by mouth daily before breakfast. (Patient not taking: Reported on 01/20/2022) 30 tablet 6   gabapentin (NEURONTIN) 300 MG capsule Take 2 capsules (600 mg total) by mouth 3 (three) times daily. (Patient not taking: Reported on 01/20/2022) 180 capsule 6   glipiZIDE (GLUCOTROL XL) 10 MG 24  hr tablet Take 1 tablet (10 mg total) by mouth daily with breakfast. (Patient not taking: Reported on 01/20/2022) 30 tablet 6   glucose blood (ACCU-CHEK GUIDE) test strip Use up to 3 times daily as directed 100 each 3   hydrOXYzine (ATARAX) 25 MG tablet Take 1 tablet (25 mg total) by mouth at bedtime. (Patient not taking: Reported on 01/20/2022) 30 tablet 2   Insulin Glargine Solostar (LANTUS) 100 UNIT/ML Solostar Pen Inject 24 Units into the skin daily. (Patient not taking: Reported on 01/20/2022) 15 mL 2   insulin lispro (HUMALOG) 100 UNIT/ML KwikPen Inject 5 Units into the skin 2 (two) times daily before lunch and supper. (Patient not taking: Reported on 01/20/2022) 6 mL 6   Insulin Pen Needle 31G X 8 MM MISC Use with insulin pen 100 each 1   metFORMIN (GLUCOPHAGE) 1000 MG tablet Take 1 tablet (1,000 mg total) by  mouth 2 (two) times daily. (Patient not taking: Reported on 01/20/2022) 60 tablet 6   methocarbamol (ROBAXIN) 500 MG tablet Take 1 tablet (500 mg total) by mouth 3 times/day as needed-between meals & bedtime for muscle spasms. (Patient not taking: Reported on 01/20/2022) 20 tablet 0   tamsulosin (FLOMAX) 0.4 MG CAPS capsule Take 1 capsule (0.4 mg total) by mouth daily. For renal calculi (Patient not taking: Reported on 01/20/2022) 30 capsule 3   PTA Medications: Prior to Admission medications   Medication Sig Start Date End Date Taking? Authorizing Provider  Accu-Chek Softclix Lancets lancets Use up to 4 times daily as directed 07/04/21   Barrett, Evelene Croon, PA-C  amLODipine (NORVASC) 10 MG tablet Take 1 tablet (10 mg total) by mouth daily. Patient not taking: Reported on 01/20/2022 10/04/21   Elsie Stain, MD  atorvastatin (LIPITOR) 40 MG tablet Take 1 tablet (40 mg total) by mouth daily. Patient not taking: Reported on 01/20/2022 10/04/21   Elsie Stain, MD  Blood Glucose Monitoring Suppl (ACCU-CHEK GUIDE) w/Device KIT USE AS DIRECTED TO CHECK BLOOD SUGARS UP TO TWICE PER DAY 07/05/21   [name], MD  DULoxetine (CYMBALTA) 60 MG capsule Take 1 capsule (60 mg total) by mouth daily. Patient not taking: Reported on 01/20/2022 12/06/21   Elsie Stain, MD  empagliflozin (JARDIANCE) 10 MG TABS tablet Take 1 tablet (10 mg total) by mouth daily before breakfast. Patient not taking: Reported on 01/20/2022 12/06/21   Elsie Stain, MD  gabapentin (NEURONTIN) 300 MG capsule Take 2 capsules (600 mg total) by mouth 3 (three) times daily. Patient not taking: Reported on 01/20/2022 10/04/21   Elsie Stain, MD  glipiZIDE (GLUCOTROL XL) 10 MG 24 hr tablet Take 1 tablet (10 mg total) by mouth daily with breakfast. Patient not taking: Reported on 01/20/2022 12/13/21   Elsie Stain, MD  glucose blood (ACCU-CHEK GUIDE) test strip Use up to 3 times daily as directed 09/20/21   Elsie Stain, MD  hydrOXYzine (ATARAX) 25 MG tablet Take 1 tablet (25 mg total) by mouth at bedtime. Patient not taking: Reported on 01/20/2022 11/09/21   Elsie Stain, MD  Insulin Glargine Solostar (LANTUS) 100 UNIT/ML Solostar Pen Inject 24 Units into the skin daily. Patient not taking: Reported on 01/20/2022 11/29/21   Elsie Stain, MD  insulin lispro (HUMALOG) 100 UNIT/ML KwikPen Inject 5 Units into the skin 2 (two) times daily before lunch and supper. Patient not taking: Reported on 01/20/2022 11/29/21   Elsie Stain, MD  Insulin Pen Needle 31G X 8  MM MISC Use with insulin pen 10/04/21   Elsie Stain, MD  metFORMIN (GLUCOPHAGE) 1000 MG tablet Take 1 tablet (1,000 mg total) by mouth 2 (two) times daily. Patient not taking: Reported on 01/20/2022 12/06/21   Elsie Stain, MD  methocarbamol (ROBAXIN) 500 MG tablet Take 1 tablet (500 mg total) by mouth 3 times/day as needed-between meals & bedtime for muscle spasms. Patient not taking: Reported on 01/20/2022 01/07/22   Drenda Freeze, MD  tamsulosin (FLOMAX) 0.4 MG CAPS capsule Take 1 capsule (0.4 mg total) by mouth daily. For renal calculi Patient not taking: Reported on 01/20/2022 12/06/21   Elsie Stain, MD  loratadine (CLARITIN) 10 MG tablet Take 1 tablet (10 mg total) by mouth daily. Patient not taking: No sig reported 06/03/17 07/20/20  [name], MD    Patient Stressors: Health problems   Substance abuse   Other: homelessness    Patient Strengths: Capable of independent living   Treatment Modalities: Medication Management, Group therapy, Case management,  1 to 1 session with clinician, Psychoeducation, Recreational therapy.   Physician Treatment Plan for Primary and Secondary Diagnosis:  Final diagnoses:  None   Long Term Goal(s):    Short Term Goals:    Medication Management: Evaluate patient's response, side effects, and tolerance of medication regimen.  Therapeutic Interventions: 1 to 1  sessions, Unit Group sessions and Medication administration.  Evaluation of Outcomes: Progressing  LCSW Treatment Plan for Primary Diagnosis:  Final diagnoses:  None    Long Term Goal(s): Safe transition to appropriate next level of care at discharge.  Short Term Goals: Facilitate acceptance of mental health diagnosis and concerns through verbal commitment to aftercare plan and appointments at discharge., Patient will identify one social support prior to discharge to aid in patient's recovery., Patient will attend AA/NA groups as scheduled., Identify minimum of 2 triggers associated with mental health/substance abuse issues with treatment team members., and Increase skills for wellness and recovery by attending 50% of scheduled groups.  Therapeutic Interventions: Assess for all discharge needs, 1 to 1 time with Education officer, museum, Explore available resources and support systems, Assess for adequacy in community support network, Educate family and significant other(s) on suicide prevention, Complete Psychosocial Assessment, Interpersonal group therapy.  Evaluation of Outcomes: Progressing   Progress in Treatment: Attending groups: No. Patient is new to unit and has been encouraged to participate in all scheduled activities.  Participating in groups: No.Patient is new to unit and has been encouraged to participate in all scheduled activities.  Taking medication as prescribed: Yes. Toleration medication: Yes. Family/Significant other contact made: No, will contact:  Patient provided permission for LCSW to follow up with his Sister David Wyatt [phone] Patient understands diagnosis: No. Discussing patient identified problems/goals with staff: Yes. Medical problems stabilized or resolved: Yes. Denies suicidal/homicidal ideation: Yes. Issues/concerns per patient self-inventory: Yes. Other: homelessness and substance use  New problem(s) identified: No, Describe:  none other than reported on  admission  New Short Term/Long Term Goal(s): Safe transition to appropriate next level of care at discharge, Engage patient in therapeutic group addressing interpersonal concerns. Engage patient in aftercare planning with referrals and resources, Increase ability to appropriately verbalize feelings, Facilitate acceptance of mental health diagnosis and concerns and Identify triggers associated with mental health/substance abuse issues.   Patient Goals:  Patient is seeking residential placement or sober living at this time.   Discharge Plan or Barriers: Patient has been accepted to [facility] in [place], [place]. LCSW currently awaiting  confirmation on date to admit.   Reason for Continuation of Hospitalization: Medication stabilization  Estimated Length of Stay: 3-5 days  Last 3 [place] Suicide Severity Risk Score: [facility] from 01/21/2022 in [facility] ED from 01/20/2022 in [place] ED from 01/18/2022 in [place] Low Risk Low Risk No Risk       Last PHQ 2/9 Scores:    10/08/2021    9:04 AM 09/05/2021    9:17 AM 07/02/2021   12:22 PM  Depression screen PHQ 2/9  Decreased Interest _0 Down, Depressed, Hopeless _1 PHQ - 2 Score _2 Altered sleeping _3 Tired, decreased energy _4 Change in appetite 2 2 0  Feeling bad or failure about yourself  3 0 1  Trouble concentrating _5 Moving slowly or fidgety/restless 3 0 0  Suicidal thoughts 0 0 1  PHQ-9 Score _6 Difficult doing work/chores  Not difficult at all Somewhat difficult    Scribe for Treatment Team: Gigi Gin 01/22/2022 10:46 AM

## 2022-01-22 NOTE — Progress Notes (Signed)
Patient has been on the phone calling resources to secure aftercare.  Patient is now sitting in day room eating a snack.  No complaints.  Will monitor.

## 2022-01-22 NOTE — Progress Notes (Signed)
[name] at dinner =177 he received 5units standing novolog and 1 unit sliding scale

## 2022-01-22 NOTE — Discharge Planning (Signed)
LCSW followed up with the following facilities regarding bed availability and requirements: [place]: Spoke with [facility] who reports they are able to accommodate patients on insulin injectables. Referral to be faxed over once completed by patient.  [place] with [facility] who reports they are able to accommodate patients on insulin injectables. Referral to be faxed over once completed by patient.  [facility]: Spoke with Representative Joe who reports he will follow up with his clinical supervisor regarding accommodating a patient that requires insulin injectables. Update will be provided once received. Agency currently has a wait list of about 6 applicants. It will be 3-5 days before answer can be provided regarding admission. Referral to be faxed over once completed by patient.  [facility]: Spoke with [employer] [facility] who reports there are currently no male beds available at this time.   Patient was provided the following numbers for his follow up:  [facility]: [place]: [phone] [facility]: [phone] ext [phone].  List of [employer] in [place] with availability   Patient informed LCSW that he would make phone calls and complete applications. Patient aware that LCSW will fax referrals once received.   Lucius Conn, LCSW Clinical Social Worker [facility] Ph: [phone]

## 2022-01-22 NOTE — ED Notes (Addendum)
Pt CBG rechecked at 1917.  Pt Glu is 94 mg/dL.

## 2022-01-22 NOTE — ED Notes (Signed)
Patient attended group with social worker, they watched a video with discussion.

## 2022-01-22 NOTE — ED Notes (Signed)
Pt asleep in bed. Respirations even and unlabored. Monitoring for safety. 

## 2022-01-22 NOTE — ED Notes (Signed)
Patient is awake and alert resting in bed.  He is calm and without withdrawal or somatic distress.  Patient f.s. this morning = 137.  Denies avh shi ro plan and will seek staff if overwhelmed by thoughts or feelings.

## 2022-01-22 NOTE — B[facility] Group Notes (Signed)
[facility] LCSW Group Therapy Note  Date/Time: 01/22/2022 at 3:15pm  Type of Therapy/Topic:  Group Therapy:  Balance in Life  Participation Level:  Active  Description of Group:   This group will address the importance of considering the journey and not just the destination. Patients will be encouraged to process areas in their lives where they found it hard to focus on the good rather than the bad, and identify reasons for maintaining such thought pattern. Facilitator will guide patients utilizing problem- solving interventions to address and improve the way each patient views themselves and their situation. Patients will work through understanding and applying humility when it comes to life changes and the interactions we have with others. Patients will be encouraged to explore ways that they will move forward throughout life's journey, and make healthier decisions for themselves.   Therapeutic Goals: Patient will identify two or more emotions or situations that they observed or felt throughout watching the video clip. Patient will identify signs where they may have responded to someone or situation due to their current circumstance.  Patient will identify two ways to set better habits in order to achieve more peace in their lives. Patient will demonstrate ability to communicate their needs through discussion and/or role plays.  Summary of Patient Progress: Patient actively participated in group on today. Patient was able to identify areas or times in his life where he felt like it was harder to focus on the good rather than the negative. Patient reports that the video clip definitely made him reflect on the way he has probably treated others in the past. Patient reported that at times it is hard think about how your reactions may have affect another person. Patient reports feeling encouraged from watching the video, and his was able to provide feedback to staff and peers.    Therapeutic Modalities:    Cognitive Behavioral Therapy Solution-Focused Therapy Assertiveness Training  David Wyatt, [place] Clinical Social Worker [facility] Ph: [phone]

## 2022-01-22 NOTE — Tx Team (Signed)
LCSW met with patient to assess current mood, affect, physical state, and inquire about needs/goals while here in [facility] and after discharge. Patient reports he presented due to homelessness and crack cocaine use. Patient reports he has been living on the streets for the last 3-4 weeks and reports prior to that he was at [facility]. Patient reports every since he has been back on the streets, he has been using $100 worth of crack per day. Patient reports he receives a disability check each month. Patient denies having access to transportation, and reports having limited social support. Patient initially stated that he has no family within the area, however when further examined patient reported that he has a sister in [place] that is supportive. Patient reports due to his health problems, he is unable to continuing living on the streets and would like to receive residential placement at discharge. Patient did not equate the need for residential placement being due to the crack cocaine use. Patient reports he has been to the [facility] in the past. Patient reports he would also be interested in [facility] facilities in the area. Patient aware that LCSW will follow up with him to provide a list of agencies he could follow up with regarding bed availability. LCSW will also send his clinicals out to residential programs in the area to explore placement options. Current barrier for the patient will be the required insulin injections. LCSW will follow up and provide updates as received. Patient expressed understanding and appreciation of LCSW assistance. No other needs were reported at this time by patient.   LCSW followed up with Admissions Coordinator Sharyn Lull at [facility] to explore requirements regarding insulin injectables. Per Sharyn Lull, patient would not be appropriate for their facility if injectables is required. LCSW will extend residential bed search for facilities within the  area.   Lucius Conn, LCSW Clinical Social Worker [facility] Ph: [phone]

## 2022-01-23 ENCOUNTER — Encounter (HOSPITAL_COMMUNITY): Payer: Self-pay | Admitting: Student

## 2022-01-23 DIAGNOSIS — I10 Essential (primary) hypertension: Secondary | ICD-10-CM | POA: Diagnosis not present

## 2022-01-23 DIAGNOSIS — E785 Hyperlipidemia, unspecified: Secondary | ICD-10-CM | POA: Diagnosis not present

## 2022-01-23 DIAGNOSIS — Z720 Tobacco use: Secondary | ICD-10-CM | POA: Diagnosis not present

## 2022-01-23 DIAGNOSIS — F1420 Cocaine dependence, uncomplicated: Secondary | ICD-10-CM | POA: Diagnosis not present

## 2022-01-23 LAB — TSH: TSH: 0.203 u[IU]/mL — ABNORMAL LOW (ref 0.350–4.500)

## 2022-01-23 LAB — GLUCOSE, CAPILLARY
Glucose-Capillary: 177 mg/dL — ABNORMAL HIGH (ref 70–99)
Glucose-Capillary: 59 mg/dL — ABNORMAL LOW (ref 70–99)
Glucose-Capillary: 132 mg/dL — ABNORMAL HIGH (ref 70–99)
Glucose-Capillary: 284 mg/dL — ABNORMAL HIGH (ref 70–99)
Glucose-Capillary: 188 mg/dL — ABNORMAL HIGH (ref 70–99)
Glucose-Capillary: 294 mg/dL — ABNORMAL HIGH (ref 70–99)

## 2022-01-23 LAB — T4, FREE: Free T4: 0.72 ng/dL (ref 0.61–1.12)

## 2022-01-23 MED ORDER — ACCU-CHEK GUIDE W/DEVICE KIT: PACK | 0 refills | Status: DC

## 2022-01-23 MED ORDER — TAMSULOSIN HCL 0.4 MG PO CAPS: 0.4000 mg | ORAL_CAPSULE | Freq: Every day | ORAL | 0 refills | Status: DC

## 2022-01-23 MED ORDER — GLUCERNA SHAKE PO LIQD
237.0000 mL | Freq: Three times a day (TID) | ORAL | Status: DC
  Administered 2022-01-23 – 2022-02-06 (×40): 237 mL via ORAL
  Filled 2022-01-23 (×38): qty 237

## 2022-01-23 MED ORDER — ATORVASTATIN CALCIUM 40 MG PO TABS: 40.0000 mg | ORAL_TABLET | Freq: Every day | ORAL | 0 refills | Status: DC

## 2022-01-23 MED ORDER — DULOXETINE HCL 60 MG PO CPEP: 60.0000 mg | ORAL_CAPSULE | Freq: Every day | ORAL | 0 refills | Status: DC

## 2022-01-23 MED ORDER — INSULIN PEN NEEDLE 31G X 8 MM MISC: 1 refills | Status: DC

## 2022-01-23 MED ORDER — METFORMIN HCL 1000 MG PO TABS: 1000.0000 mg | ORAL_TABLET | Freq: Two times a day (BID) | ORAL | 0 refills | Status: DC

## 2022-01-23 MED ORDER — AMLODIPINE BESYLATE 10 MG PO TABS: 10.0000 mg | ORAL_TABLET | Freq: Every day | ORAL | 0 refills | Status: DC

## 2022-01-23 MED ORDER — GLIPIZIDE ER 10 MG PO TB24: 10.0000 mg | ORAL_TABLET | Freq: Every day | ORAL | 0 refills | Status: DC

## 2022-01-23 MED ORDER — INSULIN GLARGINE SOLOSTAR 100 UNIT/ML ~~LOC~~ SOPN: 24.0000 [IU] | PEN_INJECTOR | Freq: Every day | SUBCUTANEOUS | 2 refills | Status: DC

## 2022-01-23 MED ORDER — EMPAGLIFLOZIN 10 MG PO TABS: 10.0000 mg | ORAL_TABLET | Freq: Every day | ORAL | 0 refills | Status: AC

## 2022-01-23 MED ORDER — ACCU-CHEK GUIDE VI STRP: ORAL_STRIP | 3 refills | Status: DC

## 2022-01-23 MED ORDER — GABAPENTIN 300 MG PO CAPS: 600.0000 mg | ORAL_CAPSULE | Freq: Three times a day (TID) | ORAL | 0 refills | Status: DC

## 2022-01-23 MED ORDER — ACCU-CHEK SOFTCLIX LANCETS MISC: 0 refills | Status: DC

## 2022-01-23 MED ORDER — LUBRIDERM SERIOUSLY SENSITIVE EX LOTN
1.0000 | TOPICAL_LOTION | Freq: Two times a day (BID) | CUTANEOUS | Status: DC
  Administered 2022-01-23 – 2022-01-31 (×12): 1 via TOPICAL
  Filled 2022-01-23 (×2): qty 562

## 2022-01-23 NOTE — ED Provider Notes (Signed)
[facility] Progress Note  Date & Time: 01/23/2022 11:29 AM Name: David Wyatt Age: 61 y.o.  DOB: [birth year]  MRN: [id number]  Diagnosis:  Final diagnoses:  Dermoid cyst of skin of back  Tobacco abuse  Diabetic polyneuropathy associated with type 2 diabetes mellitus ([place])  Cocaine use disorder, severe, dependence ([place])    Reason for presentation: No chief complaint on file.  Brief HPI  David Wyatt is a 61 y.o. male, with PMH cocaine use disorder, tobacco use disorder, housing instability, DM2 + neuropathy, HTN, HLD, no inpatient psych admission, no suicide attempts, who initially presented to [facility] (01/20/2022) for passive SI and assistance with rehab for crack cocaine use.  Patient was then IVC by EDP, which was rescinded on 01/21/2022 by Mayra Neer, MD.  He was then admitted Voluntary to [facility] (01/21/2022) via EMS for assistance with residential placement.       Interval Hx   Patient Narrative:   Patient was upset about not being able to have cornflakes this AM. After educating patients about side effects of uncontrolled diabetes, patient stated that he will try to eat more protein and fiber, decreased carbs.  He was amenable to starting Glucerna in between meals.  Stated that he does now how to administer insulin to himself.  He denied side effects to current medications.  He had no other questions or concerns.  XL:[id number] Thoughts: No (Last time was 11/12.  Contracted to safety.) OZ:[id number] Thoughts: No QQV:[id number]: None (Denied AVH)  Mood:  (Tired) Sleep:Good Appetite: Good Review of Systems  Constitutional:  Negative for malaise/fatigue.  Respiratory:  Negative for shortness of breath.   Cardiovascular:  Negative for chest pain.  Gastrointestinal:  Negative for abdominal pain, constipation, diarrhea, nausea and vomiting.  Genitourinary:  Negative for frequency.  Neurological:  Negative for dizziness, tremors and headaches.   Endo/Heme/Allergies:  Negative for polydipsia.     Past History   Psychiatric History:  Dx: cocaine use disorder, tobacco use disorder Prior Rx: Patient unsure OP psychiatrist: Denied OP therapist: Denied PCP: None Suicide Attempt: Denied  Inpatient psych: Denied  Psychiatric Family History: Patient unsure  Social History:   Living: Housing instability Income: Disabilities, $900/mo Social support: Sister in [place], none in [place]  EtOH:  reports no history of alcohol use.  Tobacco:  reports that he has been smoking cigarettes. He has a 7.50 pack-year smoking history. He quit smokeless tobacco use about 6 years ago.  Cannabis: Denied Opiates: Denied Stimulants: Crack cocaine - smokes BZO/hypnotics: Denied Seizure/DT: Denied Treatments: Yes IVDU: Denied  Past Medical History:  Past Medical History:  Diagnosis Date  . Angioedema of lips 07/28/2012   left upper (07/29/2012)  . Arthritis    hands and back  . Chronic back pain   . Cocaine abuse ([place])   . Diabetic neuropathy ([place])   . High cholesterol   . Hypertension   . Neuropathy   . Type II diabetes mellitus ([place])     Past Surgical History:  Procedure Laterality Date  . BACK SURGERY    . CYSTOSCOPY W/ URETERAL STENT PLACEMENT Right 02/23/2021   Procedure: CYSTOSCOPY WITH RETROGRADE PYELOGRAM/URETERAL STENT PLACEMENT;  Surgeon: Janith Lima, MD;  Location: [facility];  Service: Urology;  Laterality: Right;  . HERNIA REPAIR Right 04/01/2012  . I & D EXTREMITY Left 12/13/2012   Procedure: IRRIGATION AND DEBRIDEMENT LEFT THUMB;  Surgeon: Tennis Must, MD;  Location: [place];  Service: Orthopedics;  Laterality: Left;  .  INCISION AND DRAINAGE OF WOUND     boil on back/notes 07/14/2008 (07/29/2012)  . INGUINAL HERNIA REPAIR  04/01/2012   Procedure: HERNIA REPAIR INGUINAL ADULT;  Surgeon: Haywood Lasso, MD;  Location: [place];  Service: General;  Laterality: Right;  . INGUINAL HERNIA REPAIR Left 09/06/2016   Procedure: OPEN REPAIR  LEFT INGUINAL HERNIA;  Surgeon: Greer Pickerel, MD;  Location: [place];  Service: General;  Laterality: Left;  . INSERTION OF MESH Left 09/06/2016   Procedure: INSERTION OF MESH;  Surgeon: Greer Pickerel, MD;  Location: [place];  Service: General;  Laterality: Left;  . TONSILLECTOMY     Family History:  Family History  Problem Relation Age of Onset  . Diabetes Mother   . Kidney disease Mother   . Hyperlipidemia Mother   . Hyperlipidemia Father   . Diabetes Father    Social History   Substance and Sexual Activity  Alcohol Use No  . Alcohol/week: 0.0 standard drinks of alcohol    Social History   Substance and Sexual Activity  Drug Use Not Currently  . Types: "Crack" cocaine    Social History   Socioeconomic History  . Marital status: Divorced    Spouse name: Not on file  . Number of children: 3  . Years of education: 33  . Highest education level: Not on file  Occupational History    Comment: disabled  Tobacco Use  . Smoking status: Every Day    Packs/day: 0.25    Years: 30.00    Total pack years: 7.50    Types: Cigarettes  . Smokeless tobacco: Former    Quit date: 08/15/2015  . Tobacco comments:    04/29/16 2  cigs daily, 10/27/17 sometimes < .25 PPD  Vaping Use  . Vaping Use: Never used  Substance and Sexual Activity  . Alcohol use: No    Alcohol/week: 0.0 standard drinks of alcohol  . Drug use: Not Currently    Types: "Crack" cocaine  . Sexual activity: Not on file  Other Topics Concern  . Not on file  Social History Narrative   04/29/17   Lives in shelter   Caffeine- a lot of  tea, coffee   Social Determinants of Health   Financial Resource Strain: Not on file  Food Insecurity: Not on file  Transportation Needs: Not on file  Physical Activity: Not on file  Stress: Not on file  Social Connections: Not on file   SDOH: SDOH Screenings   Depression (PHQ2-9): Low Risk  (01/22/2022)  Tobacco Use: High Risk (01/20/2022)   Additional Social History:   Current  Medications   Current Facility-Administered Medications  Medication Dose Route Frequency Provider Last Rate Last Admin  . acetaminophen (TYLENOL) tablet 650 mg  650 mg Oral Q6H PRN Merrily Brittle, DO      . alum & mag hydroxide-simeth (MAALOX/MYLANTA) 200-200-20 MG/5ML suspension 30 mL  30 mL Oral Q4H PRN Merrily Brittle, DO      . amLODipine (NORVASC) tablet 10 mg  10 mg Oral Daily Merrily Brittle, DO   10 mg at 01/23/22 0912  . atorvastatin (LIPITOR) tablet 40 mg  40 mg Oral Daily Merrily Brittle, DO   40 mg at 01/23/22 0913  . cetaphil lotion   Topical BID Merrily Brittle, DO      . DULoxetine (CYMBALTA) DR capsule 60 mg  60 mg Oral Daily Merrily Brittle, DO   60 mg at 01/23/22 0912  . empagliflozin (JARDIANCE) tablet 10 mg  10 mg  Oral QAC breakfast Merrily Brittle, DO   10 mg at 01/23/22 0912  . feeding supplement (GLUCERNA SHAKE) (GLUCERNA SHAKE) liquid 237 mL  237 mL Oral TID BM Merrily Brittle, DO   237 mL at 01/23/22 1020  . gabapentin (NEURONTIN) capsule 600 mg  600 mg Oral TID Merrily Brittle, DO   600 mg at 01/23/22 0912  . glipiZIDE (GLUCOTROL XL) 24 hr tablet 10 mg  10 mg Oral Q breakfast Merrily Brittle, DO   10 mg at 01/23/22 0913  . insulin aspart (novoLOG) injection 0-6 Units  0-6 Units Subcutaneous TID WC Merrily Brittle, DO   1 Units at 01/22/22 1601  . insulin aspart (novoLOG) injection 5 Units  5 Units Subcutaneous BID AC Merrily Brittle, DO   5 Units at 01/22/22 1601  . insulin glargine-yfgn (SEMGLEE) injection 24 Units  24 Units Subcutaneous Daily Minda Ditto, RPH   24 Units at 01/23/22 0913  . magnesium hydroxide (MILK OF MAGNESIA) suspension 30 mL  30 mL Oral Daily PRN Merrily Brittle, DO      . metFORMIN (GLUCOPHAGE) tablet 1,000 mg  1,000 mg Oral BID WC Merrily Brittle, DO   1,000 mg at 01/23/22 0912  . nicotine (NICODERM CQ - dosed in mg/24 hours) patch 21 mg  21 mg Transdermal Q0600 Merrily Brittle, DO   21 mg at 01/23/22 0522  . nicotine polacrilex (NICORETTE) gum 4 mg  4 mg Oral PRN Merrily Brittle, DO      . tamsulosin [facility]) capsule 0.4 mg  0.4 mg Oral Daily Merrily Brittle, DO   0.4 mg at 01/23/22 0962   Current Outpatient Medications  Medication Sig Dispense Refill  . Accu-Chek Softclix Lancets lancets Use up to 4 times daily as directed 100 each 0  . amLODipine (NORVASC) 10 MG tablet Take 1 tablet (10 mg total) by mouth daily. 30 tablet 0  . atorvastatin (LIPITOR) 40 MG tablet Take 1 tablet (40 mg total) by mouth daily. 30 tablet 0  . Blood Glucose Monitoring Suppl (ACCU-CHEK GUIDE) w/Device KIT USE AS DIRECTED TO CHECK BLOOD SUGARS UP TO TWICE PER DAY 1 kit 0  . DULoxetine (CYMBALTA) 60 MG capsule Take 1 capsule (60 mg total) by mouth daily. 30 capsule 0  . empagliflozin (JARDIANCE) 10 MG TABS tablet Take 1 tablet (10 mg total) by mouth daily before breakfast. 30 tablet 0  . gabapentin (NEURONTIN) 300 MG capsule Take 2 capsules (600 mg total) by mouth 3 (three) times daily. 180 capsule 0  . glipiZIDE (GLUCOTROL XL) 10 MG 24 hr tablet Take 1 tablet (10 mg total) by mouth daily with breakfast. 30 tablet 0  . glucose blood (ACCU-CHEK GUIDE) test strip Use up to 3 times daily as directed 100 each 3  . Insulin Glargine Solostar (LANTUS) 100 UNIT/ML Solostar Pen Inject 24 Units into the skin daily. 15 mL 2  . insulin lispro (HUMALOG) 100 UNIT/ML KwikPen Inject 5 Units into the skin 2 (two) times daily before lunch and supper. (Patient not taking: Reported on 01/20/2022) 6 mL 6  . Insulin Pen Needle 31G X 8 MM MISC Use with insulin pen 100 each 1  . metFORMIN (GLUCOPHAGE) 1000 MG tablet Take 1 tablet (1,000 mg total) by mouth 2 (two) times daily. 60 tablet 0  . tamsulosin (FLOMAX) 0.4 MG CAPS capsule Take 1 capsule (0.4 mg total) by mouth daily. For renal calculi 30 capsule 0    Labs / Images  Lab Results:  Admission on 01/21/2022  Component Date Value Ref Range Status  . Hgb A1c MFr Bld 01/21/2022 10.4 (H)  4.8 - 5.6 % Final   Comment: (NOTE) Pre diabetes:           5.7%-6.4%  Diabetes:              >6.4%  Glycemic control for   <7.0% adults with diabetes   . Mean Plasma Glucose 01/21/2022 251.78  mg/dL Final   Performed at [facility] [address], [place], [place] [address]  . TSH 01/21/2022 0.248 (L)  0.350 - 4.500 uIU/mL Final   Comment: Performed by a 3rd Generation assay with a functional sensitivity of <=0.01 uIU/mL. Performed at [facility], [place] [address] [place], [place] [address]   . Glucose-Capillary 01/21/2022 374 (H)  70 - 99 mg/dL Final   Glucose reference range applies only to samples taken after fasting for at least 8 hours.  . Glucose-Capillary 01/21/2022 289 (H)  70 - 99 mg/dL Final   Glucose reference range applies only to samples taken after fasting for at least 8 hours.  . Glucose-Capillary 01/22/2022 137 (H)  70 - 99 mg/dL Final   Glucose reference range applies only to samples taken after fasting for at least 8 hours.  . Glucose-Capillary 01/22/2022 297 (H)  70 - 99 mg/dL Final   Glucose reference range applies only to samples taken after fasting for at least 8 hours.  . Glucose-Capillary 01/22/2022 94  70 - 99 mg/dL Final   Glucose reference range applies only to samples taken after fasting for at least 8 hours.  . Free T4 01/23/2022 0.72  0.61 - 1.12 ng/dL Final   Comment: (NOTE) Biotin ingestion may interfere with free T4 tests. If the results are inconsistent with the TSH level, previous test results, or the clinical presentation, then consider biotin interference. If needed, order repeat testing after stopping biotin. Performed at [facility], [place] [address], [place], [place] [address]   . TSH 01/23/2022 0.203 (L)  0.350 - 4.500 uIU/mL Final   Comment: Performed by a 3rd Generation assay with a functional sensitivity of <=0.01 uIU/mL. Performed at [facility], [place] [address] [place], [place] [address]   . Glucose-Capillary 01/23/2022 132 (H)  70 - 99 mg/dL Final   Glucose  reference range applies only to samples taken after fasting for at least 8 hours.  . Glucose-Capillary 01/22/2022 59 (L)  70 - 99 mg/dL Final   Glucose reference range applies only to samples taken after fasting for at least 8 hours.  . Glucose-Capillary 01/22/2022 177 (H)  70 - 99 mg/dL Final   Glucose reference range applies only to samples taken after fasting for at least 8 hours.  Admission on 01/20/2022, Discharged on 01/21/2022  Component Date Value Ref Range Status  . SARS Coronavirus 2 by RT PCR 01/20/2022 NEGATIVE  NEGATIVE Final   Comment: (NOTE) SARS-CoV-2 target nucleic acids are NOT DETECTED.  The SARS-CoV-2 RNA is generally detectable in upper respiratory specimens during the acute phase of infection. The lowest concentration of SARS-CoV-2 viral copies this assay can detect is 138 copies/mL. A negative result does not preclude SARS-Cov-2 infection and should not be used as the sole basis for treatment or other patient management decisions. A negative result may occur with  improper specimen collection/handling, submission of specimen other than nasopharyngeal swab, presence of viral mutation(s) within the areas targeted by this assay, and inadequate number of viral copies(<138 copies/mL).  A negative result must be combined with clinical observations, patient history, and epidemiological information. The expected result is Negative.  Fact Sheet for Patients:  EntrepreneurPulse.com.au  Fact Sheet for Healthcare Providers:  IncredibleEmployment.be  This test is no                          t yet approved or cleared by the [place] FDA and  has been authorized for detection and/or diagnosis of SARS-CoV-2 by FDA under an Emergency Use Authorization (EUA). This EUA will remain  in effect (meaning this test can be used) for the duration of the COVID-19 declaration under Section 564(b)(1) of the Act, 21 U.S.C.section 360bbb-3(b)(1),  unless the authorization is terminated  or revoked sooner.      . Influenza A by PCR 01/20/2022 NEGATIVE  NEGATIVE Final  . Influenza B by PCR 01/20/2022 NEGATIVE  NEGATIVE Final   Comment: (NOTE) The Xpert Xpress SARS-CoV-2/FLU/RSV plus assay is intended as an aid in the diagnosis of influenza from Nasopharyngeal swab specimens and should not be used as a sole basis for treatment. Nasal washings and aspirates are unacceptable for Xpert Xpress SARS-CoV-2/FLU/RSV testing.  Fact Sheet for Patients: EntrepreneurPulse.com.au  Fact Sheet for Healthcare Providers: IncredibleEmployment.be  This test is not yet approved or cleared by the [place] FDA and has been authorized for detection and/or diagnosis of SARS-CoV-2 by FDA under an Emergency Use Authorization (EUA). This EUA will remain in effect (meaning this test can be used) for the duration of the COVID-19 declaration under Section 564(b)(1) of the Act, 21 U.S.C. section 360bbb-3(b)(1), unless the authorization is terminated or revoked.  Performed at [facility], [place] [address], [place], [place] [address]   . Sodium 01/20/2022 136  135 - 145 mmol/L Final  . Potassium 01/20/2022 4.0  3.5 - 5.1 mmol/L Final  . Chloride 01/20/2022 103  98 - 111 mmol/L Final  . CO2 01/20/2022 24  22 - 32 mmol/L Final  . Glucose, Bld 01/20/2022 360 (H)  70 - 99 mg/dL Final   Glucose reference range applies only to samples taken after fasting for at least 8 hours.  . BUN 01/20/2022 12  8 - 23 mg/dL Final  . Creatinine, Ser 01/20/2022 1.01  0.61 - 1.24 mg/dL Final  . Calcium 01/20/2022 9.4  8.9 - 10.3 mg/dL Final  . Total Protein 01/20/2022 7.3  6.5 - 8.1 g/dL Final  . Albumin 01/20/2022 3.4 (L)  3.5 - 5.0 g/dL Final  . AST 01/20/2022 14 (L)  15 - 41 U/L Final  . ALT 01/20/2022 10  0 - 44 U/L Final  . Alkaline Phosphatase 01/20/2022 101  38 - 126 U/L Final  . Total Bilirubin 01/20/2022 0.3  0.3 - 1.2  mg/dL Final  . GFR, Estimated 01/20/2022 >60  >60 mL/min Final   Comment: (NOTE) Calculated using the CKD-EPI Creatinine Equation (2021)   . Anion gap 01/20/2022 9  5 - 15 Final   Performed at [facility], [place] [address] [place], [place] [address]  . Alcohol, Ethyl (B) 01/20/2022 <10  <10 mg/dL Final   Comment: (NOTE) Lowest detectable limit for serum alcohol is 10 mg/dL.  For medical purposes only. Performed at [facility], [place] [address] [place], [place] [address]   . Opiates 01/20/2022 NONE DETECTED  NONE DETECTED Final  . Cocaine 01/20/2022 POSITIVE (A)  NONE DETECTED Final  . Benzodiazepines 01/20/2022 NONE DETECTED  NONE DETECTED  Final  . Amphetamines 01/20/2022 NONE DETECTED  NONE DETECTED Final  . Tetrahydrocannabinol 01/20/2022 NONE DETECTED  NONE DETECTED Final  . Barbiturates 01/20/2022 NONE DETECTED  NONE DETECTED Final   Comment: (NOTE) DRUG SCREEN FOR MEDICAL PURPOSES ONLY.  IF CONFIRMATION IS NEEDED FOR ANY PURPOSE, NOTIFY LAB WITHIN 5 DAYS.  LOWEST DETECTABLE LIMITS FOR URINE DRUG SCREEN Drug Class                     Cutoff (ng/mL) Amphetamine and metabolites    1000 Barbiturate and metabolites    200 Benzodiazepine                 200 Opiates and metabolites        300 Cocaine and metabolites        300 THC                            50 Performed at [facility], [place] [address], [place], [place] [address]   . WBC 01/20/2022 5.2  4.0 - 10.5 K/uL Final  . RBC 01/20/2022 4.73  4.22 - 5.81 MIL/uL Final  . Hemoglobin 01/20/2022 14.3  13.0 - 17.0 g/dL Final  . HCT 01/20/2022 43.1  39.0 - 52.0 % Final  . MCV 01/20/2022 91.1  80.0 - 100.0 fL Final  . MCH 01/20/2022 30.2  26.0 - 34.0 pg Final  . MCHC 01/20/2022 33.2  30.0 - 36.0 g/dL Final  . RDW 01/20/2022 14.1  11.5 - 15.5 % Final  . Platelets 01/20/2022 444 (H)  150 - 400 K/uL Final  . nRBC 01/20/2022 0.0  0.0 - 0.2 % Final  . Neutrophils Relative % 01/20/2022 61  % Final  . Neutro  Abs 01/20/2022 3.2  1.7 - 7.7 K/uL Final  . Lymphocytes Relative 01/20/2022 29  % Final  . Lymphs Abs 01/20/2022 1.5  0.7 - 4.0 K/uL Final  . Monocytes Relative 01/20/2022 7  % Final  . Monocytes Absolute 01/20/2022 0.4  0.1 - 1.0 K/uL Final  . Eosinophils Relative 01/20/2022 2  % Final  . Eosinophils Absolute 01/20/2022 0.1  0.0 - 0.5 K/uL Final  . Basophils Relative 01/20/2022 1  % Final  . Basophils Absolute 01/20/2022 0.0  0.0 - 0.1 K/uL Final  . Immature Granulocytes 01/20/2022 0  % Final  . Abs Immature Granulocytes 01/20/2022 0.01  0.00 - 0.07 K/uL Final   Performed at [facility], [place] [address] [place], [place] [address]  . Salicylate Lvl 73/53/2992 <7.0 (L)  7.0 - 30.0 mg/dL Final   Performed at [facility] [address], [place], [place] [address]  . Acetaminophen (Tylenol), Serum 01/20/2022 <10 (L)  10 - 30 ug/mL Final   Comment: (NOTE) Therapeutic concentrations vary significantly. A range of 10-30 ug/mL  may be an effective concentration for many patients. However, some  are best treated at concentrations outside of this range. Acetaminophen concentrations >150 ug/mL at 4 hours after ingestion  and >50 ug/mL at 12 hours after ingestion are often associated with  toxic reactions.  Performed at [facility], [place] [address] [place], [place] [address]   . Glucose-Capillary 01/20/2022 344 (H)  70 - 99 mg/dL Final   Glucose reference range applies only to samples taken after fasting for at least 8 hours.  . Comment 1 01/20/2022 Notify RN   Final  . Comment 2 01/20/2022 Document in  Chart   Final  . Glucose-Capillary 01/20/2022 479 (H)  70 - 99 mg/dL Final   Glucose reference range applies only to samples taken after fasting for at least 8 hours.  . Glucose-Capillary 01/21/2022 243 (H)  70 - 99 mg/dL Final   Glucose reference range applies only to samples taken after fasting for at least 8 hours.  . Glucose-Capillary 01/21/2022 224 (H)  70 - 99 mg/dL  Final   Glucose reference range applies only to samples taken after fasting for at least 8 hours.  . Glucose-Capillary 01/21/2022 282 (H)  70 - 99 mg/dL Final   Glucose reference range applies only to samples taken after fasting for at least 8 hours.  Admission on 01/18/2022, Discharged on 01/19/2022  Component Date Value Ref Range Status  . WBC 01/18/2022 5.3  4.0 - 10.5 K/uL Final  . RBC 01/18/2022 4.47  4.22 - 5.81 MIL/uL Final  . Hemoglobin 01/18/2022 13.7  13.0 - 17.0 g/dL Final  . HCT 01/18/2022 41.0  39.0 - 52.0 % Final  . MCV 01/18/2022 91.7  80.0 - 100.0 fL Final  . MCH 01/18/2022 30.6  26.0 - 34.0 pg Final  . MCHC 01/18/2022 33.4  30.0 - 36.0 g/dL Final  . RDW 01/18/2022 14.2  11.5 - 15.5 % Final  . Platelets 01/18/2022 445 (H)  150 - 400 K/uL Final  . nRBC 01/18/2022 0.0  0.0 - 0.2 % Final   Performed at [facility], [place] [address] [place], [place] [address]  . Sodium 01/18/2022 136  135 - 145 mmol/L Final  . Potassium 01/18/2022 4.3  3.5 - 5.1 mmol/L Final  . Chloride 01/18/2022 100  98 - 111 mmol/L Final  . CO2 01/18/2022 22  22 - 32 mmol/L Final  . Glucose, Bld 01/18/2022 499 (H)  70 - 99 mg/dL Final   Glucose reference range applies only to samples taken after fasting for at least 8 hours.  . BUN 01/18/2022 9  8 - 23 mg/dL Final  . Creatinine, Ser 01/18/2022 1.23  0.61 - 1.24 mg/dL Final  . Calcium 01/18/2022 9.1  8.9 - 10.3 mg/dL Final  . Total Protein 01/18/2022 6.6  6.5 - 8.1 g/dL Final  . Albumin 01/18/2022 3.2 (L)  3.5 - 5.0 g/dL Final  . AST 01/18/2022 18  15 - 41 U/L Final  . ALT 01/18/2022 9  0 - 44 U/L Final  . Alkaline Phosphatase 01/18/2022 95  38 - 126 U/L Final  . Total Bilirubin 01/18/2022 0.3  0.3 - 1.2 mg/dL Final  . GFR, Estimated 01/18/2022 >60  >60 mL/min Final   Comment: (NOTE) Calculated using the CKD-EPI Creatinine Equation (2021)   . Anion gap 01/18/2022 14  5 - 15 Final   Performed at [facility], [place] [address],  [place], [place] [address]  . Color, Urine 01/18/2022 YELLOW  YELLOW Final  . APPearance 01/18/2022 CLEAR  CLEAR Final  . Specific Gravity, Urine 01/18/2022 1.025  1.005 - 1.030 Final  . pH 01/18/2022 6.0  5.0 - 8.0 Final  . Glucose, UA 01/18/2022 >=500 (A)  NEGATIVE mg/dL Final  . Hgb urine dipstick 01/18/2022 NEGATIVE  NEGATIVE Final  . Bilirubin Urine 01/18/2022 NEGATIVE  NEGATIVE Final  . Ketones, ur 01/18/2022 NEGATIVE  NEGATIVE mg/dL Final  . Protein, ur 01/18/2022 NEGATIVE  NEGATIVE mg/dL Final  . Nitrite 01/18/2022 NEGATIVE  NEGATIVE Final  . Chalmers Guest 01/18/2022 SMALL (A)  NEGATIVE Final  . RBC / HPF 01/18/2022  0-5  0 - 5 RBC/hpf Final  . WBC, UA 01/18/2022 21-50  0 - 5 WBC/hpf Final  . Bacteria, UA 01/18/2022 RARE (A)  NONE SEEN Final  . Squamous Epithelial / LPF 01/18/2022 0-5  0 - 5 Final  . Budding Yeast 01/18/2022 PRESENT   Final   Performed at [facility], [place] [address] [place], [place] [address]  . Opiates 01/18/2022 NONE DETECTED  NONE DETECTED Final  . Cocaine 01/18/2022 POSITIVE (A)  NONE DETECTED Final  . Benzodiazepines 01/18/2022 NONE DETECTED  NONE DETECTED Final  . Amphetamines 01/18/2022 NONE DETECTED  NONE DETECTED Final  . Tetrahydrocannabinol 01/18/2022 NONE DETECTED  NONE DETECTED Final  . Barbiturates 01/18/2022 NONE DETECTED  NONE DETECTED Final   Comment: (NOTE) DRUG SCREEN FOR MEDICAL PURPOSES ONLY.  IF CONFIRMATION IS NEEDED FOR ANY PURPOSE, NOTIFY LAB WITHIN 5 DAYS.  LOWEST DETECTABLE LIMITS FOR URINE DRUG SCREEN Drug Class                     Cutoff (ng/mL) Amphetamine and metabolites    1000 Barbiturate and metabolites    200 Benzodiazepine                 200 Opiates and metabolites        300 Cocaine and metabolites        300 THC                            50 Performed at [facility], [place] [address] [place], [place] [address]   . Glucose-Capillary 01/18/2022 483 (H)  70 - 99 mg/dL Final   Glucose reference range applies  only to samples taken after fasting for at least 8 hours.  . Glucose-Capillary 01/19/2022 586 (HH)  70 - 99 mg/dL Final   Glucose reference range applies only to samples taken after fasting for at least 8 hours.  . Glucose-Capillary 01/19/2022 456 (H)  70 - 99 mg/dL Final   Glucose reference range applies only to samples taken after fasting for at least 8 hours.  . Glucose-Capillary 01/19/2022 515 (HH)  70 - 99 mg/dL Final   Glucose reference range applies only to samples taken after fasting for at least 8 hours.  . Comment 1 01/19/2022 Notify RN   Final  . Glucose-Capillary 01/19/2022 164 (H)  70 - 99 mg/dL Final   Glucose reference range applies only to samples taken after fasting for at least 8 hours.  Admission on 01/07/2022, Discharged on 01/07/2022  Component Date Value Ref Range Status  . Glucose-Capillary 01/07/2022 267 (H)  70 - 99 mg/dL Final   Glucose reference range applies only to samples taken after fasting for at least 8 hours.  Admission on 01/02/2022, Discharged on 01/02/2022  Component Date Value Ref Range Status  . WBC 01/02/2022 7.0  4.0 - 10.5 K/uL Final  . RBC 01/02/2022 4.54  4.22 - 5.81 MIL/uL Final  . Hemoglobin 01/02/2022 13.9  13.0 - 17.0 g/dL Final  . HCT 01/02/2022 41.4  39.0 - 52.0 % Final  . MCV 01/02/2022 91.2  80.0 - 100.0 fL Final  . MCH 01/02/2022 30.6  26.0 - 34.0 pg Final  . MCHC 01/02/2022 33.6  30.0 - 36.0 g/dL Final  . RDW 01/02/2022 14.1  11.5 - 15.5 % Final  . Platelets 01/02/2022 369  150 - 400 K/uL Final  . nRBC 01/02/2022 0.0  0.0 - 0.2 %  Final  . Neutrophils Relative % 01/02/2022 59  % Final  . Neutro Abs 01/02/2022 4.1  1.7 - 7.7 K/uL Final  . Lymphocytes Relative 01/02/2022 28  % Final  . Lymphs Abs 01/02/2022 1.9  0.7 - 4.0 K/uL Final  . Monocytes Relative 01/02/2022 8  % Final  . Monocytes Absolute 01/02/2022 0.5  0.1 - 1.0 K/uL Final  . Eosinophils Relative 01/02/2022 4  % Final  . Eosinophils Absolute 01/02/2022 0.3  0.0 - 0.5 K/uL  Final  . Basophils Relative 01/02/2022 1  % Final  . Basophils Absolute 01/02/2022 0.1  0.0 - 0.1 K/uL Final  . Immature Granulocytes 01/02/2022 0  % Final  . Abs Immature Granulocytes 01/02/2022 0.02  0.00 - 0.07 K/uL Final   Performed at [facility] [address], [place], [place] [address]  . Sodium 01/02/2022 138  135 - 145 mmol/L Final  . Potassium 01/02/2022 4.1  3.5 - 5.1 mmol/L Final  . Chloride 01/02/2022 104  98 - 111 mmol/L Final  . CO2 01/02/2022 25  22 - 32 mmol/L Final  . Glucose, Bld 01/02/2022 216 (H)  70 - 99 mg/dL Final   Glucose reference range applies only to samples taken after fasting for at least 8 hours.  . BUN 01/02/2022 11  8 - 23 mg/dL Final  . Creatinine, Ser 01/02/2022 0.98  0.61 - 1.24 mg/dL Final  . Calcium 01/02/2022 9.5  8.9 - 10.3 mg/dL Final  . Total Protein 01/02/2022 7.4  6.5 - 8.1 g/dL Final  . Albumin 01/02/2022 3.4 (L)  3.5 - 5.0 g/dL Final  . AST 01/02/2022 12 (L)  15 - 41 U/L Final  . ALT 01/02/2022 11  0 - 44 U/L Final  . Alkaline Phosphatase 01/02/2022 90  38 - 126 U/L Final  . Total Bilirubin 01/02/2022 0.9  0.3 - 1.2 mg/dL Final  . GFR, Estimated 01/02/2022 >60  >60 mL/min Final   Comment: (NOTE) Calculated using the CKD-EPI Creatinine Equation (2021)   . Anion gap 01/02/2022 9  5 - 15 Final   Performed at [facility], [place] [address] [place], [place] [address]  [facility] on 11/01/2021  Component Date Value Ref Range Status  . POC Glucose 11/27/2021 301 (A)  70 - 99 mg/dl Final   1 hr after lunch, had not taken insulin  Admission on 10/13/2021, Discharged on 10/15/2021  Component Date Value Ref Range Status  . Glucose-Capillary 10/13/2021 388 (H)  70 - 99 mg/dL Final   Glucose reference range applies only to samples taken after fasting for at least 8 hours.  Marland Kitchen SARS Coronavirus 2 by RT PCR 10/13/2021 NEGATIVE  NEGATIVE Final   Comment: (NOTE) SARS-CoV-2 target nucleic acids are NOT DETECTED.  The  SARS-CoV-2 RNA is generally detectable in upper respiratory specimens during the acute phase of infection. The lowest concentration of SARS-CoV-2 viral copies this assay can detect is 138 copies/mL. A negative result does not preclude SARS-Cov-2 infection and should not be used as the sole basis for treatment or other patient management decisions. A negative result may occur with  improper specimen collection/handling, submission of specimen other than nasopharyngeal swab, presence of viral mutation(s) within the areas targeted by this assay, and inadequate number of viral copies(<138 copies/mL). A negative result must be combined with clinical observations, patient history, and epidemiological information. The expected result is Negative.  Fact Sheet for Patients:  EntrepreneurPulse.com.au  Fact Sheet for Healthcare Providers:  IncredibleEmployment.be  This test is no                          t yet approved or cleared by the [place] and  has been authorized for detection and/or diagnosis of SARS-CoV-2 by FDA under an Emergency Use Authorization (EUA). This EUA will remain  in effect (meaning this test can be used) for the duration of the COVID-19 declaration under Section 564(b)(1) of the Act, 21 U.S.C.section 360bbb-3(b)(1), unless the authorization is terminated  or revoked sooner.      . Influenza A by PCR 10/13/2021 NEGATIVE  NEGATIVE Final  . Influenza B by PCR 10/13/2021 NEGATIVE  NEGATIVE Final   Comment: (NOTE) The Xpert Xpress SARS-CoV-2/FLU/RSV plus assay is intended as an aid in the diagnosis of influenza from Nasopharyngeal swab specimens and should not be used as a sole basis for treatment. Nasal washings and aspirates are unacceptable for Xpert Xpress SARS-CoV-2/FLU/RSV testing.  Fact Sheet for Patients: EntrepreneurPulse.com.au  Fact Sheet for Healthcare  Providers: IncredibleEmployment.be  This test is not yet approved or cleared by the [place] FDA and has been authorized for detection and/or diagnosis of SARS-CoV-2 by FDA under an Emergency Use Authorization (EUA). This EUA will remain in effect (meaning this test can be used) for the duration of the COVID-19 declaration under Section 564(b)(1) of the Act, 21 U.S.C. section 360bbb-3(b)(1), unless the authorization is terminated or revoked.  Performed at [facility], [place] [address] [place], [place] [address]   . WBC 10/13/2021 9.5  4.0 - 10.5 K/uL Final  . RBC 10/13/2021 4.23  4.22 - 5.81 MIL/uL Final  . Hemoglobin 10/13/2021 13.0  13.0 - 17.0 g/dL Final  . HCT 10/13/2021 40.4  39.0 - 52.0 % Final  . MCV 10/13/2021 95.5  80.0 - 100.0 fL Final  . MCH 10/13/2021 30.7  26.0 - 34.0 pg Final  . MCHC 10/13/2021 32.2  30.0 - 36.0 g/dL Final  . RDW 10/13/2021 13.4  11.5 - 15.5 % Final  . Platelets 10/13/2021 551 (H)  150 - 400 K/uL Final  . nRBC 10/13/2021 0.0  0.0 - 0.2 % Final  . Neutrophils Relative % 10/13/2021 68  % Final  . Neutro Abs 10/13/2021 6.5  1.7 - 7.7 K/uL Final  . Lymphocytes Relative 10/13/2021 18  % Final  . Lymphs Abs 10/13/2021 1.7  0.7 - 4.0 K/uL Final  . Monocytes Relative 10/13/2021 9  % Final  . Monocytes Absolute 10/13/2021 0.8  0.1 - 1.0 K/uL Final  . Eosinophils Relative 10/13/2021 3  % Final  . Eosinophils Absolute 10/13/2021 0.3  0.0 - 0.5 K/uL Final  . Basophils Relative 10/13/2021 1  % Final  . Basophils Absolute 10/13/2021 0.1  0.0 - 0.1 K/uL Final  . Immature Granulocytes 10/13/2021 1  % Final  . Abs Immature Granulocytes 10/13/2021 0.11 (H)  0.00 - 0.07 K/uL Final   Performed at [facility], [place] [address], [place], [place] [address]  . Sodium 10/13/2021 136  135 - 145 mmol/L Final  . Potassium 10/13/2021 4.3  3.5 - 5.1 mmol/L Final  . Chloride 10/13/2021 104  98 - 111 mmol/L Final  . CO2 10/13/2021 24  22 - 32  mmol/L Final  . Glucose, Bld 10/13/2021 296 (H)  70 - 99 mg/dL Final   Glucose reference range applies only to samples taken after fasting for at least 8 hours.  . BUN 10/13/2021 14  8 - 23 mg/dL Final  . Creatinine, Ser 10/13/2021 1.15  0.61 - 1.24 mg/dL Final  . Calcium 10/13/2021 9.0  8.9 - 10.3 mg/dL Final  . Total Protein 10/13/2021 7.5  6.5 - 8.1 g/dL Final  . Albumin 10/13/2021 3.0 (L)  3.5 - 5.0 g/dL Final  . AST 10/13/2021 13 (L)  15 - 41 U/L Final  . ALT 10/13/2021 10  0 - 44 U/L Final  . Alkaline Phosphatase 10/13/2021 84  38 - 126 U/L Final  . Total Bilirubin 10/13/2021 0.5  0.3 - 1.2 mg/dL Final  . GFR, Estimated 10/13/2021 >60  >60 mL/min Final   Comment: (NOTE) Calculated using the CKD-EPI Creatinine Equation (2021)   . Anion gap 10/13/2021 8  5 - 15 Final   Performed at [facility] [address], [place], [place] [address]  . Color, Urine 10/13/2021 YELLOW  YELLOW Final  . APPearance 10/13/2021 CLOUDY (A)  CLEAR Final  . Specific Gravity, Urine 10/13/2021 1.022  1.005 - 1.030 Final  . pH 10/13/2021 5.0  5.0 - 8.0 Final  . Glucose, UA 10/13/2021 >=500 (A)  NEGATIVE mg/dL Final  . Hgb urine dipstick 10/13/2021 LARGE (A)  NEGATIVE Final  . Bilirubin Urine 10/13/2021 NEGATIVE  NEGATIVE Final  . Ketones, ur 10/13/2021 NEGATIVE  NEGATIVE mg/dL Final  . Protein, ur 10/13/2021 30 (A)  NEGATIVE mg/dL Final  . Nitrite 10/13/2021 NEGATIVE  NEGATIVE Final  . Chalmers Guest 10/13/2021 LARGE (A)  NEGATIVE Final  . RBC / HPF 10/13/2021 >50 (H)  0 - 5 RBC/hpf Final  . WBC, UA 10/13/2021 >50 (H)  0 - 5 WBC/hpf Final  . Bacteria, UA 10/13/2021 RARE (A)  NONE SEEN Final  . Squamous Epithelial / LPF 10/13/2021 0-5  0 - 5 Final  . WBC Clumps 10/13/2021 PRESENT   Final  . Mucus 10/13/2021 PRESENT   Final  . Budding Yeast 10/13/2021 PRESENT   Final  . Non Squamous Epithelial 10/13/2021 0-5 (A)  NONE SEEN Final   Performed at [facility], [place] [address] [place],  [place] [address]  . pH, Ven 10/13/2021 7.457 (H)  7.25 - 7.43 Final  . pCO2, Ven 10/13/2021 38.1 (L)  44 - 60 mmHg Final  . pO2, Ven 10/13/2021 174 (H)  32 - 45 mmHg Final  . Bicarbonate 10/13/2021 26.9  20.0 - 28.0 mmol/L Final  . TCO2 10/13/2021 28  22 - 32 mmol/L Final  . O2 Saturation 10/13/2021 100  % Final  . Acid-Base Excess 10/13/2021 3.0 (H)  0.0 - 2.0 mmol/L Final  . Sodium 10/13/2021 137  135 - 145 mmol/L Final  . Potassium 10/13/2021 4.3  3.5 - 5.1 mmol/L Final  . Calcium, Ion 10/13/2021 1.10 (L)  1.15 - 1.40 mmol/L Final  . HCT 10/13/2021 40.0  39.0 - 52.0 % Final  . Hemoglobin 10/13/2021 13.6  13.0 - 17.0 g/dL Final  . Sample type 10/13/2021 VENOUS   Final  . Opiates 10/13/2021 NONE DETECTED  NONE DETECTED Final  . Cocaine 10/13/2021 POSITIVE (A)  NONE DETECTED Final  . Benzodiazepines 10/13/2021 NONE DETECTED  NONE DETECTED Final  . Amphetamines 10/13/2021 NONE DETECTED  NONE DETECTED Final  . Tetrahydrocannabinol 10/13/2021 NONE DETECTED  NONE DETECTED Final  . Barbiturates 10/13/2021 NONE DETECTED  NONE DETECTED Final   Comment: (NOTE) DRUG SCREEN FOR MEDICAL PURPOSES ONLY.  IF CONFIRMATION IS NEEDED FOR ANY PURPOSE, NOTIFY LAB WITHIN 5 DAYS.  LOWEST DETECTABLE LIMITS FOR URINE DRUG SCREEN  Drug Class                     Cutoff (ng/mL) Amphetamine and metabolites    1000 Barbiturate and metabolites    200 Benzodiazepine                 106 Tricyclics and metabolites     300 Opiates and metabolites        300 Cocaine and metabolites        300 THC                            50 Performed at [facility], [place] [address], [place], [place] [address]   . Adenovirus 10/13/2021 NOT DETECTED  NOT DETECTED Final  . Coronavirus 229E 10/13/2021 NOT DETECTED  NOT DETECTED Final   Comment: (NOTE) The Coronavirus on the Respiratory Panel, DOES NOT test for the novel  Coronavirus (2019 nCoV)   . Coronavirus HKU1 10/13/2021 NOT DETECTED  NOT DETECTED Final  . Coronavirus  NL63 10/13/2021 NOT DETECTED  NOT DETECTED Final  . Coronavirus OC43 10/13/2021 NOT DETECTED  NOT DETECTED Final  . Metapneumovirus 10/13/2021 NOT DETECTED  NOT DETECTED Final  . Rhinovirus / Enterovirus 10/13/2021 NOT DETECTED  NOT DETECTED Final  . Influenza A 10/13/2021 NOT DETECTED  NOT DETECTED Final  . Influenza B 10/13/2021 NOT DETECTED  NOT DETECTED Final  . Parainfluenza Virus 1 10/13/2021 NOT DETECTED  NOT DETECTED Final  . Parainfluenza Virus 2 10/13/2021 NOT DETECTED  NOT DETECTED Final  . Parainfluenza Virus 3 10/13/2021 NOT DETECTED  NOT DETECTED Final  . Parainfluenza Virus 4 10/13/2021 NOT DETECTED  NOT DETECTED Final  . Respiratory Syncytial Virus 10/13/2021 NOT DETECTED  NOT DETECTED Final  . Bordetella pertussis 10/13/2021 NOT DETECTED  NOT DETECTED Final  . Bordetella Parapertussis 10/13/2021 NOT DETECTED  NOT DETECTED Final  . Chlamydophila pneumoniae 10/13/2021 NOT DETECTED  NOT DETECTED Final  . Mycoplasma pneumoniae 10/13/2021 NOT DETECTED  NOT DETECTED Final   Performed at [facility] [address] [place], [place] [address]  . Procalcitonin 10/13/2021 <0.10  ng/mL Final   Comment:        Interpretation: PCT (Procalcitonin) <= 0.5 ng/mL: Systemic infection (sepsis) is not likely. Local bacterial infection is possible. (NOTE)       Sepsis PCT Algorithm           Lower Respiratory Tract                                      Infection PCT Algorithm    ----------------------------     ----------------------------         PCT < 0.25 ng/mL                PCT < 0.10 ng/mL          Strongly encourage             Strongly discourage   discontinuation of antibiotics    initiation of antibiotics    ----------------------------     -----------------------------       PCT 0.25 - 0.50 ng/mL            PCT 0.10 - 0.25 ng/mL               OR       >80%  decrease in PCT            Discourage initiation of                                            antibiotics       Encourage discontinuation           of antibiotics    ----------------------------     -----------------------------         PCT >= 0.50 ng/mL              PCT 0.26 - 0.50 ng/mL               AND                                 <80% decrease in PCT             Encourage initiation of                                             antibiotics       Encourage continuation           of antibiotics    ----------------------------     -----------------------------        PCT >= 0.50 ng/mL                  PCT > 0.50 ng/mL               AND         increase in PCT                  Strongly encourage                                      initiation of antibiotics    Strongly encourage escalation           of antibiotics                                     -----------------------------                                           PCT <= 0.25 ng/mL                                                 OR                                        > 80% decrease in PCT  Discontinue / Do not initiate                                             antibiotics  Performed at [facility], [place] [address] [place], [place] [address]   . B Natriuretic Peptide 10/13/2021 23.6  0.0 - 100.0 pg/mL Final   Performed at [facility], [place] [address], [place], [place] [address]  . L. pneumophila Serogp 1 Ur Ag 10/13/2021 Negative  Negative Final   Comment: (NOTE) Presumptive negative for L. pneumophila serogroup 1 antigen in urine, suggesting no recent or current infection. Legionnaires' disease cannot be ruled out since other serogroups and species may also cause disease. Performed At: [facility] [place], [place] [id number] Rush Farmer MD HU:[id number]   . Source of Sample 10/13/2021 URINE, RANDOM   Final   Performed at [facility], [place] [address] [place], [place] [address]  . Strep Pneumo Urinary Antigen 10/13/2021 NEGATIVE  NEGATIVE  Final   Comment:        Infection due to S. pneumoniae cannot be absolutely ruled out since the antigen present may be below the detection limit of the test. Performed at [facility], [address], [place], [place] [address]   . Sodium 10/14/2021 138  135 - 145 mmol/L Final  . Potassium 10/14/2021 4.3  3.5 - 5.1 mmol/L Final  . Chloride 10/14/2021 106  98 - 111 mmol/L Final  . CO2 10/14/2021 25  22 - 32 mmol/L Final  . Glucose, Bld 10/14/2021 286 (H)  70 - 99 mg/dL Final   Glucose reference range applies only to samples taken after fasting for at least 8 hours.  . BUN 10/14/2021 14  8 - 23 mg/dL Final  . Creatinine, Ser 10/14/2021 0.98  0.61 - 1.24 mg/dL Final  . Calcium 10/14/2021 8.9  8.9 - 10.3 mg/dL Final  . GFR, Estimated 10/14/2021 >60  >60 mL/min Final   Comment: (NOTE) Calculated using the CKD-EPI Creatinine Equation (2021)   . Anion gap 10/14/2021 7  5 - 15 Final   Performed at [facility] [address] [place], [place] [address]  . WBC 10/14/2021 9.6  4.0 - 10.5 K/uL Final  . RBC 10/14/2021 4.12 (L)  4.22 - 5.81 MIL/uL Final  . Hemoglobin 10/14/2021 12.6 (L)  13.0 - 17.0 g/dL Final  . HCT 10/14/2021 38.8 (L)  39.0 - 52.0 % Final  . MCV 10/14/2021 94.2  80.0 - 100.0 fL Final  . MCH 10/14/2021 30.6  26.0 - 34.0 pg Final  . MCHC 10/14/2021 32.5  30.0 - 36.0 g/dL Final  . RDW 10/14/2021 13.4  11.5 - 15.5 % Final  . Platelets 10/14/2021 556 (H)  150 - 400 K/uL Final  . nRBC 10/14/2021 0.0  0.0 - 0.2 % Final   Performed at [facility] [address], [place], [place] [address]  . Glucose-Capillary 10/13/2021 281 (H)  70 - 99 mg/dL Final   Glucose reference range applies only to samples taken after fasting for at least 8 hours.  . Glucose-Capillary 10/13/2021 399 (H)  70 - 99 mg/dL Final   Glucose reference range applies only to samples taken after fasting for at least 8 hours.  . Glucose-Capillary 10/14/2021 239 (H)  70 - 99 mg/dL Final   Glucose  reference range applies only to samples taken after fasting for at least 8 hours.  . Glucose-Capillary 10/14/2021 345 (H)  70 - 99 mg/dL Final   Glucose reference range applies only to samples taken after fasting for at least 8 hours.  . Glucose-Capillary 10/14/2021 383 (H)  70 - 99 mg/dL Final   Glucose reference range applies only to samples taken after fasting for at least 8 hours.  . Glucose-Capillary 10/15/2021 194 (H)  70 - 99 mg/dL Final   Glucose reference range applies only to samples taken after fasting for at least 8 hours.  . Glucose-Capillary 10/15/2021 161 (H)  70 - 99 mg/dL Final   Glucose reference range applies only to samples taken after fasting for at least 8 hours.  . Glucose-Capillary 10/15/2021 312 (H)  70 - 99 mg/dL Final   Glucose reference range applies only to samples taken after fasting for at least 8 hours.  . Glucose-Capillary 10/14/2021 279 (H)  70 - 99 mg/dL Final   Glucose reference range applies only to samples taken after fasting for at least 8 hours.  Office Visit on 10/08/2021  Component Date Value Ref Range Status  . POC Glucose 10/08/2021 321 (A)  70 - 99 mg/dl Final  . HbA1c, POC (controlled diabetic ra* 10/08/2021 9.3 (A)  0.0 - 7.0 % Final  Admission on 10/07/2021, Discharged on 10/07/2021  Component Date Value Ref Range Status  . Color, Urine 10/07/2021 YELLOW  YELLOW Final  . APPearance 10/07/2021 HAZY (A)  CLEAR Final  . Specific Gravity, Urine 10/07/2021 1.010  1.005 - 1.030 Final  . pH 10/07/2021 5.0  5.0 - 8.0 Final  . Glucose, UA 10/07/2021 >=500 (A)  NEGATIVE mg/dL Final  . Hgb urine dipstick 10/07/2021 MODERATE (A)  NEGATIVE Final  . Bilirubin Urine 10/07/2021 NEGATIVE  NEGATIVE Final  . Ketones, ur 10/07/2021 NEGATIVE  NEGATIVE mg/dL Final  . Protein, ur 10/07/2021 30 (A)  NEGATIVE mg/dL Final  . Nitrite 10/07/2021 NEGATIVE  NEGATIVE Final  . Chalmers Guest 10/07/2021 LARGE (A)  NEGATIVE Final  . RBC / HPF 10/07/2021 21-50  0 - 5 RBC/hpf  Final  . WBC, UA 10/07/2021 >50 (H)  0 - 5 WBC/hpf Final  . Bacteria, UA 10/07/2021 NONE SEEN  NONE SEEN Final  . WBC Clumps 10/07/2021 PRESENT   Final  . Mucus 10/07/2021 PRESENT   Final  . Budding Yeast 10/07/2021 PRESENT   Final   Performed at [facility], [place] [address] [place], [place] [address]  . WBC 10/07/2021 8.9  4.0 - 10.5 K/uL Final  . RBC 10/07/2021 4.35  4.22 - 5.81 MIL/uL Final  . Hemoglobin 10/07/2021 13.7  13.0 - 17.0 g/dL Final  . HCT 10/07/2021 41.9  39.0 - 52.0 % Final  . MCV 10/07/2021 96.3  80.0 - 100.0 fL Final  . MCH 10/07/2021 31.5  26.0 - 34.0 pg Final  . MCHC 10/07/2021 32.7  30.0 - 36.0 g/dL Final  . RDW 10/07/2021 13.4  11.5 - 15.5 % Final  . Platelets 10/07/2021 376  150 - 400 K/uL Final  . nRBC 10/07/2021 0.0  0.0 - 0.2 % Final  . Neutrophils Relative % 10/07/2021 59  % Final  . Neutro Abs 10/07/2021 5.2  1.7 - 7.7 K/uL Final  . Lymphocytes Relative 10/07/2021 17  % Final  . Lymphs Abs 10/07/2021 1.5  0.7 - 4.0 K/uL Final  . Monocytes Relative 10/07/2021 19  % Final  . Monocytes Absolute 10/07/2021 1.7 (H)  0.1 - 1.0 K/uL Final  . Eosinophils Relative 10/07/2021 5  % Final  . Eosinophils Absolute 10/07/2021 0.5  0.0 - 0.5 K/uL Final  . Basophils Relative 10/07/2021 0  % Final  . Basophils Absolute 10/07/2021 0.0  0.0 - 0.1 K/uL Final  . Immature Granulocytes 10/07/2021 0  % Final  . Abs Immature Granulocytes 10/07/2021 0.04  0.00 - 0.07 K/uL Final   Performed at [facility], [place] [address] [place], [place] [address]  . Sodium 10/07/2021 141  135 - 145 mmol/L Final  . Potassium 10/07/2021 4.0  3.5 - 5.1 mmol/L Final  . Chloride 10/07/2021 107  98 - 111 mmol/L Final  . CO2 10/07/2021 24  22 - 32 mmol/L Final  . Glucose, Bld 10/07/2021 81  70 - 99 mg/dL Final   Glucose reference range applies only to samples taken after fasting for at least 8 hours.  . BUN 10/07/2021 14  8 - 23 mg/dL Final  . Creatinine, Ser  10/07/2021 1.19  0.61 - 1.24 mg/dL Final  . Calcium 10/07/2021 9.4  8.9 - 10.3 mg/dL Final  . Total Protein 10/07/2021 7.9  6.5 - 8.1 g/dL Final  . Albumin 10/07/2021 3.5  3.5 - 5.0 g/dL Final  . AST 10/07/2021 12 (L)  15 - 41 U/L Final  . ALT 10/07/2021 10  0 - 44 U/L Final  . Alkaline Phosphatase 10/07/2021 73  38 - 126 U/L Final  . Total Bilirubin 10/07/2021 0.7  0.3 - 1.2 mg/dL Final  . GFR, Estimated 10/07/2021 >60  >60 mL/min Final   Comment: (NOTE) Calculated using the CKD-EPI Creatinine Equation (2021)   . Anion gap 10/07/2021 10  5 - 15 Final   Performed at [facility], [place] [address], [place], [place] [address]  . Specimen Description 10/07/2021    Final                   Value:URINE, CLEAN CATCH Performed at [facility], [place] [address] [place], [place] [address]   . Special Requests 10/07/2021    Final                   Value:NONE Performed at [facility], [place] [address] [place], [place] [address]   . Culture 10/07/2021  (A)   Final                   Value:40,000 COLONIES/mL YEAST 20,000 COLONIES/mL LACTOBACILLUS SPECIES Standardized susceptibility testing for this organism is not available. Performed at [facility], [place] [address] [place], [place] [address]   . Report Status 10/07/2021 10/08/2021 FINAL   Final  Admission on 10/04/2021, Discharged on 10/04/2021  Component Date Value Ref Range Status  . WBC 10/04/2021 9.3  4.0 - 10.5 K/uL Final  . RBC 10/04/2021 4.26  4.22 - 5.81 MIL/uL Final  . Hemoglobin 10/04/2021 13.4  13.0 - 17.0 g/dL Final  . HCT 10/04/2021 41.5  39.0 - 52.0 % Final  . MCV 10/04/2021 97.4  80.0 - 100.0 fL Final  . MCH 10/04/2021 31.5  26.0 - 34.0 pg Final  . MCHC 10/04/2021 32.3  30.0 - 36.0 g/dL Final  . RDW 10/04/2021 13.6  11.5 - 15.5 % Final  . Platelets 10/04/2021 367  150 - 400 K/uL Final  . nRBC 10/04/2021 0.0  0.0 - 0.2 % Final   Performed at [facility], [place] [address]  [place], [place] [address]  . Sodium 10/04/2021 138  135 - 145 mmol/L Final  . Potassium 10/04/2021 4.4  3.5 - 5.1 mmol/L Final  . Chloride 10/04/2021 107  98 - 111 mmol/L Final  . CO2 10/04/2021 22  22 - 32 mmol/L Final  . Glucose, Bld 10/04/2021 236 (H)  70 - 99 mg/dL Final   Glucose reference range applies only to samples taken after fasting for at least 8 hours.  . BUN 10/04/2021 13  8 - 23 mg/dL Final  . Creatinine, Ser 10/04/2021 1.15  0.61 - 1.24 mg/dL Final  . Calcium 10/04/2021 8.9  8.9 - 10.3 mg/dL Final  . GFR, Estimated 10/04/2021 >60  >60 mL/min Final   Comment: (NOTE) Calculated using the CKD-EPI Creatinine Equation (2021)   . Anion gap 10/04/2021 9  5 - 15 Final   Performed at [facility], [place] [address] [place], [place] [address]  There may be more visits with results that are not included.   Blood Alcohol level:  Lab Results  Component Value Date   ETH <10 01/20/2022   ETH <10 61/47/0929   Metabolic Disorder Labs: Lab Results  Component Value Date   HGBA1C 10.4 (H) 01/21/2022   MPG 251.78 01/21/2022   MPG >398 02/24/2021   No results found for: "PROLACTIN" Lab Results  Component Value Date   CHOL 213 (H) 05/23/2021   TRIG 115 05/23/2021   HDL 71 05/23/2021   CHOLHDL 3.2 11/03/2019   VLDL 32 (H) 05/03/2015   LDLCALC 122 (H) 05/23/2021   LDLCALC 99 11/03/2019   Therapeutic Lab Levels: No results found for: "LITHIUM" No results found for: "VALPROATE" No results found for: "CBMZ" Physical Findings   GAD-7    Flowsheet Row Office Visit from 10/08/2021 in [place] Office Visit from 09/05/2021 in [place] Office Visit from 12/26/2020 in [place] Office Visit from 11/17/2018 in [place] Office Visit from 06/05/2017 in [place] [facility]  Total GAD-7 Score _0 0 12       PHQ2-9    [place] ED from 01/21/2022 in [facility] Office Visit from 10/08/2021 in [place] Office Visit from 09/05/2021 in [place] [facility] ED from 07/02/2021 in [facility] Office Visit from 12/26/2020 in [place]  PHQ-2 Total Score _1 PHQ-9 Total Score -- _2 Flowsheet Row ED from 01/21/2022 in [facility] ED from 01/20/2022 in [place] ED from 01/18/2022 in [facility] Risk Low Risk No Risk       Musculoskeletal  Strength & Muscle Tone: within normal limits Gait & Station: normal Patient leans: forward   Psychiatric Specialty Exam   Presentation  General Appearance:Disheveled Eye Contact:None Speech:Slow (Mostly clear and coherent.  Mumbling at times.) Volume:Decreased Handedness:Right  Mood and Affect  Mood: (Tired) Affect:Appropriate, Congruent, Depressed, Restricted  Thought Process  Thought Process:Coherent, Goal Directed, Linear Descriptions of Associations:Intact  Thought Content Suicidal Thoughts:Suicidal Thoughts: No (Last time was 11/12.  Contracted to safety.) Homicidal Thoughts:Homicidal Thoughts: No Hallucinations:Hallucinations: None (Denied AVH) Ideas of Reference:None Thought Content:WDL  Sensorium  Memory:Immediate Good Judgment:Fair Insight:Fair  Executive Functions  Orientation:Full (Time, Place and  Person) Language:Good Concentration:Good [place] of Knowledge:Good  Psychomotor Activity  Psychomotor Activity:Psychomotor Activity: Psychomotor Retardation  Assets  Assets:Communication Skills, Desire for Improvement, Leisure Time  Sleep  Quality:Good  Physical Exam  BP 122/86 (BP Location: Right Arm)   Pulse 76   Temp 98.1  F (36.7 C) (Tympanic)   Resp 16   SpO2 97%  Physical Exam Vitals and nursing note reviewed.  Constitutional:      General: He is not in acute distress.    Appearance: He is ill-appearing. He is not toxic-appearing or diaphoretic.  HENT:     Head: Normocephalic.  Pulmonary:     Effort: Pulmonary effort is normal. No respiratory distress.  Feet:     Right foot:     Protective Sensation: 3 sites tested.  2 sites sensed.     Skin integrity: Callus and dry skin present. No ulcer, blister, skin breakdown or fissure.     Toenail Condition: Right toenails are normal.     Left foot:     Protective Sensation: 3 sites tested.  2 sites sensed.     Skin integrity: Callus and dry skin present. No ulcer, blister, skin breakdown or fissure.     Toenail Condition: Left toenails are normal.  Neurological:     Mental Status: He is alert.      Assessment / Plan  Total Time spent with patient: 15 minutes Treatment Plan Summary: Daily contact with patient to assess and evaluate symptoms and progress in treatment and Medication management  Principal Problem:   Cocaine use disorder, severe, dependence ([place]) Active Problems:   Essential hypertension, benign   Hyperlipidemia   Tobacco abuse   Uncontrolled type 2 diabetes mellitus with hyperglycemia ([place])   Dermoid cyst of skin of back   David Wyatt is a 61 y.o. male with PMH cocaine use disorder, tobacco use disorder, housing instability, DM2 + neuropathy, HTN, HLD, no inpatient psych admission, no suicide attempts, who initially presented to [place] (01/20/2022) for passive SI and assistance with rehab for crack cocaine use.  Patient was then IVC by EDP, which was rescinded on 01/21/2022 by Mayra Neer, MD.  He was then admitted Voluntary to [facility] (01/21/2022) via EMS for assistance with residential placement.      Patient presented as lethargic, but able to attend meals and groups. This was likely due to uncontrolled CGBs, which were initially ~300s.  His biggest concern was housing to stay away from cocaine. Barrier to residential placement has been his insulin-dependency.  Total duration of encounter: 2 days  Cocaine use d/o  Tobacco use d/o  Action stage. Encouraged cessation Comfort PRNs   Substance induced mood d/o Continued home Cymbalta 60 mg daily  DM2-insulin-dependent with neuropathy, IMPROVING A1c 10.4 (01/21/2022), from 9.7 earlier this year. Goal CBGs <180. CBGs <200s Continued home metformin 1000 mg twice daily Continued home glipizide 10 mg with breakfast Continued home empagliflozin 10 mg with breakfast Continued home insulin Long-acting 24 units daily Sensitive sliding scale Gabapentin 600 mg TID and Cymbalta per above for neuropathy Educated on self foot exam twice daily  Low TSH For T4 within normal limits Follow up T3 pending   Clinical Course as of 01/24/22 1420  Tue Jan 22, 2022  0850 TSH(!): 0.248 [JN]  0850 Hemoglobin A1C(!): 10.4 [JN]  Wed Jan 23, 2022  0758 T4,Free(Direct): 0.72 [JN]  Thu Jan 24, 2022  1420 Triiodothyronine,Free,Serum: 2.2 [JN]    Clinical Course User Index [JN] Merrily Brittle, DO  Labs at  [facility]: UDS+cocaine. CBC showed plt 445, otherwise within normal limits CMP showed glucose of 360, otherwise unremarkable.  AST/ALT wnl. BAL <43, salicylate level <7   Considerations for follow-up: Recommend high risk screening every 6-12 months with HIV, RPR, hepatitis panel PCP for insulin dependent DM2 - uncontrolled  DISPO: Tentative date: TBA Location: [place] or in [place] or [place] Unable to go to residential rehab due to insulin dependency  Signed: Merrily Brittle, DO Psychiatry Resident, PGY-2 01/23/2022, 11:29 AM   [facility] [place], [place] [address] Dept: [phone] Dept Fax: [phone]

## 2022-01-23 NOTE — ED Notes (Signed)
Pt resting quietly, breathing is even and unlabored.  Pt denies SI, HI, pain and VH.  Pt stated this morning he heard a voice calling his name but could not tell where it was coming from.  He states the voice comes and goes.  Pt also reported difficulty laying down in bed due to a rotator injury, additional pillows given. Will continue to monitor for safety.

## 2022-01-23 NOTE — Discharge Instructions (Addendum)
Dear David Wyatt,  Most effective treatment for your mental health disease involves BOTH a psychiatrist AND a therapist Psychiatrist to manage medications Therapist to help identify personal goals, barriers from those goals, and plan to achieve those goals by understanding emotions Please make regular appointments with an outpatient psychiatrist and other doctors once you leave the hospital (if any, otherwise, please see below for resources to make an appointment).  For therapy outside the hospital, please ask for these specific types of therapy: DBT ________________________________________________________  SAFETY CRISIS  Dial 988 for [place]    Text [phone] for Crisis Text Line:     [facility]:  [address] FIRST FLOOR.  [place], [place] [address].  [phone]  Mobile Crisis Response Teams Listed by counties in vicinity of [place]. [phone] [place] [phone] [place] [phone] [facility] [phone] [place] [phone] [place]. [phone] [place].  [place] [phone] ________________________________________________________  To see which pharmacy near you is the CHEAPEST for certain medications, please use GoodRx. It is free website and has a free phone app.    Also consider looking at [facility] $4.00 or [facility]'s $7.00 prescription list. Both are free to view if googled "[facility] $4 prescription" and "public's $7 prescription". These are set prices, no insurance required. [facility]'s low cost medications: $4-$15 for 30days  prescriptions or $10-$38 for 90days prescriptions  ________________________________________________________  Difficulties with sleep?   Can also use this free app for insomnia called CBT-I. Let your doctors and therapists know so they can help with extra tips and tricks or for guidance and accountability. NO ADDS on the app.     ________________________________________________________  Non-Emergent / Urgent  [facility] [address], [place], [place] [address] [phone] OUTPATIENT Walk-in information: Please note, all walk-ins are first come & first serve, with limited number of availability.  Please note that to be eligible for services you must bring: ID or a piece of mail with your name [facility] address  Therapist for therapy:  Monday & Wednesdays: Please ARRIVE at 7:15 AM for registration Will START at 8:00 AM Every 1st & 2nd Friday of the month: Please ARRIVE at 10:15 AM for registration Will START at 1 PM - 5 PM  Psychiatrist for medication management: Monday - Friday:  Please ARRIVE at 7:15 AM for registration Will START at 8:00 AM  Regretfully, due to limited availability, please be aware that you may not been seen on the same day as walk-in. Please consider making an appoint or try again. Thank you for your patience and understanding.

## 2022-01-23 NOTE — Discharge Planning (Signed)
LCSW sat with patient to complete application for [employer]. Patient reported an interest in going to [facility], however was provided an update that he would be responsible for administering his own insulin and patient stated "I believe I can do that". LCSW provided brief supportive counseling to the patient regarding the importance of being compliant with medication and how the preference would be to have a facility that could likely assist with that. Patient expressed understanding and stated he is willing to do whatever necessary to help himself. Application for [facility] was completed with the patient and faxed off.   LCSW contacted [name] in Admission at [facility] for insurance to be verified. Per [name], they are contracted with his medicaid. Application will be sent to the medical director for review and [name] will follow up with updates on tomorrow or Friday. No other needs were reported at this time.   LCSW will continue to follow and provide support to patient while in hospital.   Lucius Conn, [place] Worker [place] [facility] Ph: [phone]

## 2022-01-23 NOTE — Discharge Planning (Addendum)
LCSW received update from patient on yesterday that he followed up with the [facility] [phone] and was told that he could come to their facility when ready for discharge. Patient made aware that LCSW will follow up to confirm and provide updates as received.   LCSW contacted [facility] and spoke with Hnery at the front desk, who reports if the patient meets all of their criteria then he would be able to come to their facility when discharged. Agency does accept patients on insulin injectables, however must be able to administer himself. Patient would need to have a [place] ID, be willing to participate in 4 church services a week, able to work a 64 hour assignment, be able to care for himself, and cannot be a registered sex offender. Patient reports completing the phone screening on yesterday and reports that he meets the requirements for their agency. LCSW provided update to MD. Patient also encouraged to complete applications provided for other agencies so that they could be faxed over for review on today. Plan would be to discharge the patient to a facility that could assist with administering his medicaiton. No other needs to report at this time.    Lucius Conn, LCSW Clinical Social Worker [place] [facility] Ph: [phone]

## 2022-01-23 NOTE — Inpatient Diabetes Management (Signed)
Inpatient Diabetes Program Recommendations  AACE/ADA: New Consensus Statement on Inpatient Glycemic Control (2015)  Target Ranges:  Prepandial:   less than 140 mg/dL      Peak postprandial:   less than 180 mg/dL (1-2 hours)      Critically ill patients:  140 - 180 mg/dL   Lab Results  Component Value Date   GLUCAP 284 (H) 01/23/2022   HGBA1C 10.4 (H) 01/21/2022    Review of Glycemic Control  Latest Reference Range & Units 01/22/22 07:58 01/22/22 11:53 01/22/22 15:57 01/22/22 18:34 01/22/22 19:17 01/23/22 07:48 01/23/22 11:07  Glucose-Capillary 70 - 99 mg/dL 137 (H) No Novolog coverage; Glipizide 10 mg & Jardiance 297 (H)  Novolog 5 units 177 (H)  Novolog 6 units  59 (L) 94 132 (H)  No Novolog coverage 284 (H)  Novolog 8 units  (H): Data is abnormally high (L): Data is abnormally low  Diabetes history: DM2 Outpatient Diabetes medications:  Lantus 24 units QD Jardiance 10 mg QD Metformin 1000 mg BID Glipizide 10 mg QAM Humalog 5 units BID before lunch and supper  Current orders for Inpatient glycemic control:  Semgle 24 units QD Novolog 0-6 units TID  Novolog 5 units BID with lunch and dinner Glipizide 10 mg QAM Jardiance 10 mg QD  Inpatient Diabetes Program Recommendations:    He is consistently hyperglycemic at lunch.  Had a low yesterday evening.    Please consider:  Discontinue glipizide 10 mg Add Novolog 3 units TID with meals if consumes at least 50%  Will continue to follow while inpatient.  Thank you, Reche Dixon, MSN, [place] Diabetes Coordinator Inpatient Diabetes Program [phone] (team pager from 8a-5p)

## 2022-01-23 NOTE — ED Notes (Addendum)
Leandro Reasoner, NP notified of CBG 294.

## 2022-01-23 NOTE — ED Notes (Signed)
Pt is in the bed sleeping. Respirations are even and unlabored. No acute distress noted. Will continue to monitor for safety. 

## 2022-01-23 NOTE — ED Notes (Signed)
Pt voiced concerns about checking his sugar once discharged.  He states he currently does not have a glucometer and would like to get one.  Encouraged pt to reach out to his pharmacy and notified provider of pt's concerns.

## 2022-01-23 NOTE — ED Notes (Signed)
Pt provided sliding scale for insulin and asked what dose would he give himself based on a CBG of 284.  Pt correctly identified he would need to take 3 units.  Will continue to educated pt on sliding scale for discharge planning.

## 2022-01-23 NOTE — ED Notes (Signed)
Pt sleeping in no acute distress. RR even and unlabored. Environment secured. Will continue to monitor for safety. 

## 2022-01-23 NOTE — ED Notes (Signed)
Pt sitting in dining room watching TV. A&O x4, calm and cooperative. Denies current SI/HI/AVH. No signs of distress noted. Monitoring for safety.

## 2022-01-23 NOTE — ED Notes (Signed)
Pt asleep in bed. Respirations even and unlabored. Monitoring for safety. 

## 2022-01-24 DIAGNOSIS — E785 Hyperlipidemia, unspecified: Secondary | ICD-10-CM | POA: Diagnosis not present

## 2022-01-24 DIAGNOSIS — F1410 Cocaine abuse, uncomplicated: Secondary | ICD-10-CM | POA: Diagnosis not present

## 2022-01-24 DIAGNOSIS — F1420 Cocaine dependence, uncomplicated: Secondary | ICD-10-CM | POA: Diagnosis not present

## 2022-01-24 DIAGNOSIS — I10 Essential (primary) hypertension: Secondary | ICD-10-CM | POA: Diagnosis not present

## 2022-01-24 DIAGNOSIS — Z720 Tobacco use: Secondary | ICD-10-CM | POA: Diagnosis not present

## 2022-01-24 LAB — GLUCOSE, CAPILLARY
Glucose-Capillary: 180 mg/dL — ABNORMAL HIGH (ref 70–99)
Glucose-Capillary: 275 mg/dL — ABNORMAL HIGH (ref 70–99)
Glucose-Capillary: 146 mg/dL — ABNORMAL HIGH (ref 70–99)

## 2022-01-24 LAB — T3, FREE: T3, Free: 2.2 pg/mL (ref 2.0–4.4)

## 2022-01-24 MED ORDER — TRAZODONE HCL 50 MG PO TABS
50.0000 mg | ORAL_TABLET | Freq: Every evening | ORAL | Status: DC | PRN
  Administered 2022-01-24 – 2022-01-30 (×8): 50 mg via ORAL
  Filled 2022-01-24 (×9): qty 1

## 2022-01-24 NOTE — ED Provider Notes (Signed)
[facility] Based Crisis Behavioral Health Progress Note  Date & Time: 01/24/2022 2:17 PM Name: David Wyatt Age: 61 y.o.  DOB: 01-21-1961  MRN: [id number]  Diagnosis:  Final diagnoses:  Dermoid cyst of skin of back  Tobacco abuse  Diabetic polyneuropathy associated with type 2 diabetes mellitus ([place])  Cocaine use disorder, severe, dependence ([place])    Reason for presentation: Suicidal and Addiction Problem  Brief HPI  David Wyatt is a 61 y.o. male, with PMH cocaine use disorder, tobacco use disorder, housing instability, DM2 + neuropathy, HTN, HLD, no inpatient psych admission, no suicide attempts, who initially presented to [facility] (01/20/2022) for passive SI and assistance with rehab for crack cocaine use.  Patient was then IVC by EDP, which was rescinded on 01/21/2022 by Mayra Neer, MD.  He was then admitted Voluntary to [facility] (01/21/2022) via EMS for assistance with residential placement.       Interval Hx   Patient Narrative:   Patient stated that his energy level and mood are improved. Stated that he is no longer interested in [facility] because he would have to self-administer his insulin. Stated that he has completed the since that [facility] presented to him.  Otherwise he has no questions or concerns, and is amenable to plan for below.  BW:[id number] Thoughts: No (Last time was 11/12.  Contracted to safety.) AJ:GOTLXBWIO Thoughts: No MBT:[id number]: None (Denied AVH)  Mood:  (Tired) Sleep:Good Appetite: Good Review of Systems  Constitutional:  Negative for malaise/fatigue.  Respiratory:  Negative for shortness of breath.   Cardiovascular:  Negative for chest pain.  Gastrointestinal:  Negative for abdominal pain, constipation, diarrhea, nausea and vomiting.  Genitourinary:  Negative for frequency.  Neurological:  Negative for dizziness, tremors and headaches.  Endo/Heme/Allergies:  Negative for polydipsia.     Past History   Psychiatric History:   Dx: cocaine use disorder, tobacco use disorder Prior Rx: Patient unsure OP psychiatrist: Denied OP therapist: Denied PCP: None Suicide Attempt: Denied  Inpatient psych: Denied  Psychiatric Family History: Patient unsure  Social History:   Living: Housing instability Income: Disabilities, $900/mo Social support: Sister in [place], none in [place]  EtOH:  reports no history of alcohol use.  Tobacco:  reports that he has been smoking cigarettes. He has a 7.50 pack-year smoking history. He quit smokeless tobacco use about 6 years ago.  Cannabis: Denied Opiates: Denied Stimulants: Crack cocaine - smokes BZO/hypnotics: Denied Seizure/DT: Denied Treatments: Yes IVDU: Denied  Past Medical History:  Past Medical History:  Diagnosis Date   Angioedema of lips 07/28/2012   left upper (07/29/2012)   Arthritis    hands and back   Chronic back pain    Cocaine abuse (HCC)    Diabetic neuropathy (HCC)    High cholesterol    Hypertension    Neuropathy    Type II diabetes mellitus ([place])     Past Surgical History:  Procedure Laterality Date   BACK SURGERY     CYSTOSCOPY W/ URETERAL STENT PLACEMENT Right 02/23/2021   Procedure: CYSTOSCOPY WITH RETROGRADE PYELOGRAM/URETERAL STENT PLACEMENT;  Surgeon: Janith Lima, MD;  Location: [facility];  Service: Urology;  Laterality: Right;   HERNIA REPAIR Right 04/01/2012   I & D EXTREMITY Left 12/13/2012   Procedure: IRRIGATION AND DEBRIDEMENT LEFT THUMB;  Surgeon: Tennis Must, MD;  Location: [facility];  Service: Orthopedics;  Laterality: Left;   INCISION AND DRAINAGE OF WOUND     boil on back/notes 07/14/2008 (07/29/2012)   INGUINAL HERNIA  REPAIR  04/01/2012   Procedure: HERNIA REPAIR INGUINAL ADULT;  Surgeon: Haywood Lasso, MD;  Location: [facility];  Service: General;  Laterality: Right;   INGUINAL HERNIA REPAIR Left 09/06/2016   Procedure: OPEN REPAIR LEFT INGUINAL HERNIA;  Surgeon: Greer Pickerel, MD;  Location: [facility];  Service: General;  Laterality: Left;    INSERTION OF MESH Left 09/06/2016   Procedure: INSERTION OF MESH;  Surgeon: Greer Pickerel, MD;  Location[facility];  Service: General;  Laterality: Left;   TONSILLECTOMY     Family History:  Family History  Problem Relation Age of Onset   Diabetes Mother    Kidney disease Mother    Hyperlipidemia Mother    Hyperlipidemia Father    Diabetes Father    Social History   Substance and Sexual Activity  Alcohol Use No   Alcohol/week: 0.0 standard drinks of alcohol    Social History   Substance and Sexual Activity  Drug Use Not Currently   Types: "Crack" cocaine    Social History   Socioeconomic History   Marital status: Divorced    Spouse name: Not on file   Number of children: 3   Years of education: 12   Highest education level: Not on file  Occupational History    Comment: disabled  Tobacco Use   Smoking status: Every Day    Packs/day: 0.25    Years: 30.00    Total pack years: 7.50    Types: Cigarettes   Smokeless tobacco: Former    Quit date: 08/15/2015   Tobacco comments:    04/29/16 2  cigs daily, 10/27/17 sometimes < .25 PPD  Vaping Use   Vaping Use: Never used  Substance and Sexual Activity   Alcohol use: No    Alcohol/week: 0.0 standard drinks of alcohol   Drug use: Not Currently    Types: "Crack" cocaine   Sexual activity: Not on file  Other Topics Concern   Not on file  Social History Narrative   04/29/17   Lives in shelter   Caffeine- a lot of  tea, coffee   Social Determinants of Health   Financial Resource Strain: Not on file  Food Insecurity: Not on file  Transportation Needs: Not on file  Physical Activity: Not on file  Stress: Not on file  Social Connections: Not on file   SDOH: SDOH Screenings   Depression (PHQ2-9): Low Risk  (01/22/2022)  Tobacco Use: High Risk (01/23/2022)   Additional Social History:   Current Medications   Current Facility-Administered Medications  Medication Dose Route Frequency Provider Last Rate Last Admin    acetaminophen (TYLENOL) tablet 650 mg  650 mg Oral Q6H PRN Merrily Brittle, DO       alum & mag hydroxide-simeth (MAALOX/MYLANTA) 200-200-20 MG/5ML suspension 30 mL  30 mL Oral Q4H PRN Merrily Brittle, DO       amLODipine (NORVASC) tablet 10 mg  10 mg Oral Daily Merrily Brittle, DO   10 mg at 01/24/22 1025   atorvastatin (LIPITOR) tablet 40 mg  40 mg Oral Daily Merrily Brittle, DO   40 mg at 01/24/22 1024   DULoxetine (CYMBALTA) DR capsule 60 mg  60 mg Oral Daily Merrily Brittle, DO   60 mg at 01/24/22 1025   empagliflozin (JARDIANCE) tablet 10 mg  10 mg Oral QAC breakfast Merrily Brittle, DO   10 mg at 01/24/22 0850   feeding supplement (GLUCERNA SHAKE) (GLUCERNA SHAKE) liquid 237 mL  237 mL Oral TID BM Merrily Brittle, DO  237 mL at 01/24/22 1324   gabapentin (NEURONTIN) capsule 600 mg  600 mg Oral TID Merrily Brittle, DO   600 mg at 01/24/22 1024   glipiZIDE (GLUCOTROL XL) 24 hr tablet 10 mg  10 mg Oral Q breakfast Merrily Brittle, DO   10 mg at 01/24/22 0916   insulin aspart (novoLOG) injection 0-6 Units  0-6 Units Subcutaneous TID WC Merrily Brittle, DO   3 Units at 01/24/22 1236   insulin aspart (novoLOG) injection 5 Units  5 Units Subcutaneous BID AC Merrily Brittle, DO   5 Units at 01/24/22 1237   insulin glargine-yfgn (SEMGLEE) injection 24 Units  24 Units Subcutaneous Daily Minda Ditto, RPH   24 Units at 01/24/22 1023   lubriderm seriously sensitive lotion 1 Application  1 Application Topical BID Merrily Brittle, DO   1 Application at 71/06/26 1026   magnesium hydroxide (MILK OF MAGNESIA) suspension 30 mL  30 mL Oral Daily PRN Merrily Brittle, DO       metFORMIN (GLUCOPHAGE) tablet 1,000 mg  1,000 mg Oral BID WC Merrily Brittle, DO   1,000 mg at 01/24/22 0849   nicotine (NICODERM CQ - dosed in mg/24 hours) patch 21 mg  21 mg Transdermal Q0600 Merrily Brittle, DO   21 mg at 01/24/22 9485   nicotine polacrilex (NICORETTE) gum 4 mg  4 mg Oral PRN Merrily Brittle, DO       tamsulosin [facility]) capsule 0.4 mg  0.4 mg Oral  Daily Merrily Brittle, DO   0.4 mg at 01/24/22 1025   traZODone (DESYREL) tablet 50 mg  50 mg Oral QHS PRN Evette Georges, NP   50 mg at 01/24/22 0240   Current Outpatient Medications  Medication Sig Dispense Refill   Accu-Chek Softclix Lancets lancets Use up to 4 times daily as directed 100 each 0   amLODipine (NORVASC) 10 MG tablet Take 1 tablet (10 mg total) by mouth daily. 30 tablet 0   atorvastatin (LIPITOR) 40 MG tablet Take 1 tablet (40 mg total) by mouth daily. 30 tablet 0   Blood Glucose Monitoring Suppl (ACCU-CHEK GUIDE) w/Device KIT USE AS DIRECTED TO CHECK BLOOD SUGARS UP TO TWICE PER DAY 1 kit 0   DULoxetine (CYMBALTA) 60 MG capsule Take 1 capsule (60 mg total) by mouth daily. 30 capsule 0   empagliflozin (JARDIANCE) 10 MG TABS tablet Take 1 tablet (10 mg total) by mouth daily before breakfast. 30 tablet 0   gabapentin (NEURONTIN) 300 MG capsule Take 2 capsules (600 mg total) by mouth 3 (three) times daily. 180 capsule 0   glipiZIDE (GLUCOTROL XL) 10 MG 24 hr tablet Take 1 tablet (10 mg total) by mouth daily with breakfast. 30 tablet 0   glucose blood (ACCU-CHEK GUIDE) test strip Use up to 3 times daily as directed 100 each 3   Insulin Glargine Solostar (LANTUS) 100 UNIT/ML Solostar Pen Inject 24 Units into the skin daily. 15 mL 2   insulin lispro (HUMALOG) 100 UNIT/ML KwikPen Inject 5 Units into the skin 2 (two) times daily before lunch and supper. (Patient not taking: Reported on 01/20/2022) 6 mL 6   Insulin Pen Needle 31G X 8 MM MISC Use with insulin pen 100 each 1   metFORMIN (GLUCOPHAGE) 1000 MG tablet Take 1 tablet (1,000 mg total) by mouth 2 (two) times daily. 60 tablet 0   tamsulosin (FLOMAX) 0.4 MG CAPS capsule Take 1 capsule (0.4 mg total) by mouth daily. For renal calculi 30 capsule 0  Labs / Images  Lab Results:  Admission on 01/21/2022  Component Date Value Ref Range Status   Hgb A1c MFr Bld 01/21/2022 10.4 (H)  4.8 - 5.6 % Final   Comment: (NOTE) Pre diabetes:           5.7%-6.4%  Diabetes:              >6.4%  Glycemic control for   <7.0% adults with diabetes    Mean Plasma Glucose 01/21/2022 251.78  mg/dL Final   Performed at [facility], [place] [address], [place], [place] [address]   TSH 01/21/2022 0.248 (L)  0.350 - 4.500 uIU/mL Final   Comment: Performed by a 3rd Generation assay with a functional sensitivity of <=0.01 uIU/mL. Performed at [facility], [place] [address], [place], [place] [address]    Glucose-Capillary 01/21/2022 374 (H)  70 - 99 mg/dL Final   Glucose reference range applies only to samples taken after fasting for at least 8 hours.   Glucose-Capillary 01/21/2022 289 (H)  70 - 99 mg/dL Final   Glucose reference range applies only to samples taken after fasting for at least 8 hours.   Glucose-Capillary 01/22/2022 137 (H)  70 - 99 mg/dL Final   Glucose reference range applies only to samples taken after fasting for at least 8 hours.   Glucose-Capillary 01/22/2022 297 (H)  70 - 99 mg/dL Final   Glucose reference range applies only to samples taken after fasting for at least 8 hours.   T3, Free 01/23/2022 2.2  2.0 - 4.4 pg/mL Final   Comment: (NOTE) Performed At: [facility] [place], [place] [id number] Rush Farmer MD [id number]    Glucose-Capillary 01/22/2022 94  70 - 99 mg/dL Final   Glucose reference range applies only to samples taken after fasting for at least 8 hours.   Free T4 01/23/2022 0.72  0.61 - 1.12 ng/dL Final   Comment: (NOTE) Biotin ingestion may interfere with free T4 tests. If the results are inconsistent with the TSH level, previous test results, or the clinical presentation, then consider biotin interference. If needed, order repeat testing after stopping biotin. Performed at [facility], [place] [address] [place], [place] [address]    TSH 01/23/2022 0.203 (L)  0.350 - 4.500 uIU/mL Final   Comment: Performed by a 3rd Generation assay with a functional  sensitivity of <=0.01 uIU/mL. Performed at [facility], [place] [address] [place], [place] [address]    Glucose-Capillary 01/23/2022 132 (H)  70 - 99 mg/dL Final   Glucose reference range applies only to samples taken after fasting for at least 8 hours.   Glucose-Capillary 01/22/2022 59 (L)  70 - 99 mg/dL Final   Glucose reference range applies only to samples taken after fasting for at least 8 hours.   Glucose-Capillary 01/22/2022 177 (H)  70 - 99 mg/dL Final   Glucose reference range applies only to samples taken after fasting for at least 8 hours.   Glucose-Capillary 01/23/2022 284 (H)  70 - 99 mg/dL Final   Glucose reference range applies only to samples taken after fasting for at least 8 hours.   Glucose-Capillary 01/23/2022 188 (H)  70 - 99 mg/dL Final   Glucose reference range applies only to samples taken after fasting for at least 8 hours.   Glucose-Capillary 01/23/2022 294 (H)  70 - 99 mg/dL Final   Glucose reference range applies only to samples taken after fasting for at least  8 hours.   Glucose-Capillary 01/24/2022 180 (H)  70 - 99 mg/dL Final   Glucose reference range applies only to samples taken after fasting for at least 8 hours.   Glucose-Capillary 01/24/2022 275 (H)  70 - 99 mg/dL Final   Glucose reference range applies only to samples taken after fasting for at least 8 hours.  Admission on 01/20/2022, Discharged on 01/21/2022  Component Date Value Ref Range Status   SARS Coronavirus 2 by RT PCR 01/20/2022 NEGATIVE  NEGATIVE Final   Comment: (NOTE) SARS-CoV-2 target nucleic acids are NOT DETECTED.  The SARS-CoV-2 RNA is generally detectable in upper respiratory specimens during the acute phase of infection. The lowest concentration of SARS-CoV-2 viral copies this assay can detect is 138 copies/mL. A negative result does not preclude SARS-Cov-2 infection and should not be used as the sole basis for treatment or other patient management decisions. A negative result  may occur with  improper specimen collection/handling, submission of specimen other than nasopharyngeal swab, presence of viral mutation(s) within the areas targeted by this assay, and inadequate number of viral copies(<138 copies/mL). A negative result must be combined with clinical observations, patient history, and epidemiological information. The expected result is Negative.  Fact Sheet for Patients:  EntrepreneurPulse.com.au  Fact Sheet for Healthcare Providers:  IncredibleEmployment.be  This test is no                          t yet approved or cleared by the [place] FDA and  has been authorized for detection and/or diagnosis of SARS-CoV-2 by FDA under an Emergency Use Authorization (EUA). This EUA will remain  in effect (meaning this test can be used) for the duration of the COVID-19 declaration under Section 564(b)(1) of the Act, 21 U.S.C.section 360bbb-3(b)(1), unless the authorization is terminated  or revoked sooner.       Influenza A by PCR 01/20/2022 NEGATIVE  NEGATIVE Final   Influenza B by PCR 01/20/2022 NEGATIVE  NEGATIVE Final   Comment: (NOTE) The Xpert Xpress SARS-CoV-2/FLU/RSV plus assay is intended as an aid in the diagnosis of influenza from Nasopharyngeal swab specimens and should not be used as a sole basis for treatment. Nasal washings and aspirates are unacceptable for Xpert Xpress SARS-CoV-2/FLU/RSV testing.  Fact Sheet for Patients: EntrepreneurPulse.com.au  Fact Sheet for Healthcare Providers: IncredibleEmployment.be  This test is not yet approved or cleared by the [place] FDA and has been authorized for detection and/or diagnosis of SARS-CoV-2 by FDA under an Emergency Use Authorization (EUA). This EUA will remain in effect (meaning this test can be used) for the duration of the COVID-19 declaration under Section 564(b)(1) of the Act, 21 U.S.C. section  360bbb-3(b)(1), unless the authorization is terminated or revoked.  Performed at [facility], [place] [address] [place], [place] [address]    Sodium 01/20/2022 136  135 - 145 mmol/L Final   Potassium 01/20/2022 4.0  3.5 - 5.1 mmol/L Final   Chloride 01/20/2022 103  98 - 111 mmol/L Final   CO2 01/20/2022 24  22 - 32 mmol/L Final   Glucose, Bld 01/20/2022 360 (H)  70 - 99 mg/dL Final   Glucose reference range applies only to samples taken after fasting for at least 8 hours.   BUN 01/20/2022 12  8 - 23 mg/dL Final   Creatinine, Ser 01/20/2022 1.01  0.61 - 1.24 mg/dL Final   Calcium 01/20/2022 9.4  8.9 - 10.3 mg/dL Final   Total  Protein 01/20/2022 7.3  6.5 - 8.1 g/dL Final   Albumin 01/20/2022 3.4 (L)  3.5 - 5.0 g/dL Final   AST 01/20/2022 14 (L)  15 - 41 U/L Final   ALT 01/20/2022 10  0 - 44 U/L Final   Alkaline Phosphatase 01/20/2022 101  38 - 126 U/L Final   Total Bilirubin 01/20/2022 0.3  0.3 - 1.2 mg/dL Final   GFR, Estimated 01/20/2022 >60  >60 mL/min Final   Comment: (NOTE) Calculated using the CKD-EPI Creatinine Equation (2021)    Anion gap 01/20/2022 9  5 - 15 Final   Performed at [facility], [place] [address], [place], [place] [address]   Alcohol, Ethyl (B) 01/20/2022 <10  <10 mg/dL Final   Comment: (NOTE) Lowest detectable limit for serum alcohol is 10 mg/dL.  For medical purposes only. Performed at [facility], [place] [address], [place], [place] [address]    Opiates 01/20/2022 NONE DETECTED  NONE DETECTED Final   Cocaine 01/20/2022 POSITIVE (A)  NONE DETECTED Final   Benzodiazepines 01/20/2022 NONE DETECTED  NONE DETECTED Final   Amphetamines 01/20/2022 NONE DETECTED  NONE DETECTED Final   Tetrahydrocannabinol 01/20/2022 NONE DETECTED  NONE DETECTED Final   Barbiturates 01/20/2022 NONE DETECTED  NONE DETECTED Final   Comment: (NOTE) DRUG SCREEN FOR MEDICAL PURPOSES ONLY.  IF CONFIRMATION IS NEEDED FOR ANY PURPOSE, NOTIFY LAB WITHIN 5 DAYS.  LOWEST  DETECTABLE LIMITS FOR URINE DRUG SCREEN Drug Class                     Cutoff (ng/mL) Amphetamine and metabolites    1000 Barbiturate and metabolites    200 Benzodiazepine                 200 Opiates and metabolites        300 Cocaine and metabolites        300 THC                            50 Performed at [facility], [place] [address], [place], [place] [address]    WBC 01/20/2022 5.2  4.0 - 10.5 K/uL Final   RBC 01/20/2022 4.73  4.22 - 5.81 MIL/uL Final   Hemoglobin 01/20/2022 14.3  13.0 - 17.0 g/dL Final   HCT 01/20/2022 43.1  39.0 - 52.0 % Final   MCV 01/20/2022 91.1  80.0 - 100.0 fL Final   MCH 01/20/2022 30.2  26.0 - 34.0 pg Final   MCHC 01/20/2022 33.2  30.0 - 36.0 g/dL Final   RDW 01/20/2022 14.1  11.5 - 15.5 % Final   Platelets 01/20/2022 444 (H)  150 - 400 K/uL Final   nRBC 01/20/2022 0.0  0.0 - 0.2 % Final   Neutrophils Relative % 01/20/2022 61  % Final   Neutro Abs 01/20/2022 3.2  1.7 - 7.7 K/uL Final   Lymphocytes Relative 01/20/2022 29  % Final   Lymphs Abs 01/20/2022 1.5  0.7 - 4.0 K/uL Final   Monocytes Relative 01/20/2022 7  % Final   Monocytes Absolute 01/20/2022 0.4  0.1 - 1.0 K/uL Final   Eosinophils Relative 01/20/2022 2  % Final   Eosinophils Absolute 01/20/2022 0.1  0.0 - 0.5 K/uL Final   Basophils Relative 01/20/2022 1  % Final   Basophils Absolute 01/20/2022 0.0  0.0 - 0.1 K/uL Final   Immature Granulocytes 01/20/2022 0  % Final  Abs Immature Granulocytes 01/20/2022 0.01  0.00 - 0.07 K/uL Final   Performed at [facility], [place] [address], [place], [place] [address]   Salicylate Lvl 19/37/9024 <7.0 (L)  7.0 - 30.0 mg/dL Final   Performed at [place] [address] [place], [place] [address]   Acetaminophen (Tylenol), Serum 01/20/2022 <10 (L)  10 - 30 ug/mL Final   Comment: (NOTE) Therapeutic concentrations vary significantly. A range of 10-30 ug/mL  may be an effective concentration for many patients. However, some  are best  treated at concentrations outside of this range. Acetaminophen concentrations >150 ug/mL at 4 hours after ingestion  and >50 ug/mL at 12 hours after ingestion are often associated with  toxic reactions.  Performed at [facility], [place] [address], [place], [place] [address]    Glucose-Capillary 01/20/2022 344 (H)  70 - 99 mg/dL Final   Glucose reference range applies only to samples taken after fasting for at least 8 hours.   Comment 1 01/20/2022 Notify RN   Final   Comment 2 01/20/2022 Document in Chart   Final   Glucose-Capillary 01/20/2022 479 (H)  70 - 99 mg/dL Final   Glucose reference range applies only to samples taken after fasting for at least 8 hours.   Glucose-Capillary 01/21/2022 243 (H)  70 - 99 mg/dL Final   Glucose reference range applies only to samples taken after fasting for at least 8 hours.   Glucose-Capillary 01/21/2022 224 (H)  70 - 99 mg/dL Final   Glucose reference range applies only to samples taken after fasting for at least 8 hours.   Glucose-Capillary 01/21/2022 282 (H)  70 - 99 mg/dL Final   Glucose reference range applies only to samples taken after fasting for at least 8 hours.  Admission on 01/18/2022, Discharged on 01/19/2022  Component Date Value Ref Range Status   WBC 01/18/2022 5.3  4.0 - 10.5 K/uL Final   RBC 01/18/2022 4.47  4.22 - 5.81 MIL/uL Final   Hemoglobin 01/18/2022 13.7  13.0 - 17.0 g/dL Final   HCT 01/18/2022 41.0  39.0 - 52.0 % Final   MCV 01/18/2022 91.7  80.0 - 100.0 fL Final   MCH 01/18/2022 30.6  26.0 - 34.0 pg Final   MCHC 01/18/2022 33.4  30.0 - 36.0 g/dL Final   RDW 01/18/2022 14.2  11.5 - 15.5 % Final   Platelets 01/18/2022 445 (H)  150 - 400 K/uL Final   nRBC 01/18/2022 0.0  0.0 - 0.2 % Final   Performed at [facility], [place] [address], [place], [place] [address]   Sodium 01/18/2022 136  135 - 145 mmol/L Final   Potassium 01/18/2022 4.3  3.5 - 5.1 mmol/L Final   Chloride 01/18/2022 100  98 - 111 mmol/L Final   CO2  01/18/2022 22  22 - 32 mmol/L Final   Glucose, Bld 01/18/2022 499 (H)  70 - 99 mg/dL Final   Glucose reference range applies only to samples taken after fasting for at least 8 hours.   BUN 01/18/2022 9  8 - 23 mg/dL Final   Creatinine, Ser 01/18/2022 1.23  0.61 - 1.24 mg/dL Final   Calcium 01/18/2022 9.1  8.9 - 10.3 mg/dL Final   Total Protein 01/18/2022 6.6  6.5 - 8.1 g/dL Final   Albumin 01/18/2022 3.2 (L)  3.5 - 5.0 g/dL Final   AST 01/18/2022 18  15 - 41 U/L Final   ALT 01/18/2022 9  0 -  44 U/L Final   Alkaline Phosphatase 01/18/2022 95  38 - 126 U/L Final   Total Bilirubin 01/18/2022 0.3  0.3 - 1.2 mg/dL Final   GFR, Estimated 01/18/2022 >60  >60 mL/min Final   Comment: (NOTE) Calculated using the CKD-EPI Creatinine Equation (2021)    Anion gap 01/18/2022 14  5 - 15 Final   Performed at [place] [address] [place], [place] [address]   Color, Urine 01/18/2022 YELLOW  YELLOW Final   APPearance 01/18/2022 CLEAR  CLEAR Final   Specific Gravity, Urine 01/18/2022 1.025  1.005 - 1.030 Final   pH 01/18/2022 6.0  5.0 - 8.0 Final   Glucose, UA 01/18/2022 >=500 (A)  NEGATIVE mg/dL Final   Hgb urine dipstick 01/18/2022 NEGATIVE  NEGATIVE Final   Bilirubin Urine 01/18/2022 NEGATIVE  NEGATIVE Final   Ketones, ur 01/18/2022 NEGATIVE  NEGATIVE mg/dL Final   Protein, ur 01/18/2022 NEGATIVE  NEGATIVE mg/dL Final   Nitrite 01/18/2022 NEGATIVE  NEGATIVE Final   Leukocytes,Ua 01/18/2022 SMALL (A)  NEGATIVE Final   RBC / HPF 01/18/2022 0-5  0 - 5 RBC/hpf Final   WBC, UA 01/18/2022 21-50  0 - 5 WBC/hpf Final   Bacteria, UA 01/18/2022 RARE (A)  NONE SEEN Final   Squamous Epithelial / LPF 01/18/2022 0-5  0 - 5 Final   Budding Yeast 01/18/2022 PRESENT   Final   Performed at [facility], [place] [address] [place], [place] [address]   Opiates 01/18/2022 NONE DETECTED  NONE DETECTED Final   Cocaine 01/18/2022 POSITIVE (A)  NONE DETECTED Final   Benzodiazepines 01/18/2022 NONE  DETECTED  NONE DETECTED Final   Amphetamines 01/18/2022 NONE DETECTED  NONE DETECTED Final   Tetrahydrocannabinol 01/18/2022 NONE DETECTED  NONE DETECTED Final   Barbiturates 01/18/2022 NONE DETECTED  NONE DETECTED Final   Comment: (NOTE) DRUG SCREEN FOR MEDICAL PURPOSES ONLY.  IF CONFIRMATION IS NEEDED FOR ANY PURPOSE, NOTIFY LAB WITHIN 5 DAYS.  LOWEST DETECTABLE LIMITS FOR URINE DRUG SCREEN Drug Class                     Cutoff (ng/mL) Amphetamine and metabolites    1000 Barbiturate and metabolites    200 Benzodiazepine                 200 Opiates and metabolites        300 Cocaine and metabolites        300 THC                            50 Performed at [facility], [place] [address] [place], [place] [address]    Glucose-Capillary 01/18/2022 483 (H)  70 - 99 mg/dL Final   Glucose reference range applies only to samples taken after fasting for at least 8 hours.   Glucose-Capillary 01/19/2022 586 (HH)  70 - 99 mg/dL Final   Glucose reference range applies only to samples taken after fasting for at least 8 hours.   Glucose-Capillary 01/19/2022 456 (H)  70 - 99 mg/dL Final   Glucose reference range applies only to samples taken after fasting for at least 8 hours.   Glucose-Capillary 01/19/2022 515 (HH)  70 - 99 mg/dL Final   Glucose reference range applies only to samples taken after fasting for at least 8 hours.   Comment 1 01/19/2022 Notify RN   Final   Glucose-Capillary 01/19/2022 164 (H)  70 - 99 mg/dL Final   Glucose reference range applies only to samples taken after fasting for at least 8 hours.  Admission on 01/07/2022, Discharged on 01/07/2022  Component Date Value Ref Range Status   Glucose-Capillary 01/07/2022 267 (H)  70 - 99 mg/dL Final   Glucose reference range applies only to samples taken after fasting for at least 8 hours.  Admission on 01/02/2022, Discharged on 01/02/2022  Component Date Value Ref Range Status   WBC 01/02/2022 7.0  4.0 - 10.5 K/uL Final    RBC 01/02/2022 4.54  4.22 - 5.81 MIL/uL Final   Hemoglobin 01/02/2022 13.9  13.0 - 17.0 g/dL Final   HCT 01/02/2022 41.4  39.0 - 52.0 % Final   MCV 01/02/2022 91.2  80.0 - 100.0 fL Final   MCH 01/02/2022 30.6  26.0 - 34.0 pg Final   MCHC 01/02/2022 33.6  30.0 - 36.0 g/dL Final   RDW 01/02/2022 14.1  11.5 - 15.5 % Final   Platelets 01/02/2022 369  150 - 400 K/uL Final   nRBC 01/02/2022 0.0  0.0 - 0.2 % Final   Neutrophils Relative % 01/02/2022 59  % Final   Neutro Abs 01/02/2022 4.1  1.7 - 7.7 K/uL Final   Lymphocytes Relative 01/02/2022 28  % Final   Lymphs Abs 01/02/2022 1.9  0.7 - 4.0 K/uL Final   Monocytes Relative 01/02/2022 8  % Final   Monocytes Absolute 01/02/2022 0.5  0.1 - 1.0 K/uL Final   Eosinophils Relative 01/02/2022 4  % Final   Eosinophils Absolute 01/02/2022 0.3  0.0 - 0.5 K/uL Final   Basophils Relative 01/02/2022 1  % Final   Basophils Absolute 01/02/2022 0.1  0.0 - 0.1 K/uL Final   Immature Granulocytes 01/02/2022 0  % Final   Abs Immature Granulocytes 01/02/2022 0.02  0.00 - 0.07 K/uL Final   Performed at [facility], [place] [address] [place], [place] [address]   Sodium 01/02/2022 138  135 - 145 mmol/L Final   Potassium 01/02/2022 4.1  3.5 - 5.1 mmol/L Final   Chloride 01/02/2022 104  98 - 111 mmol/L Final   CO2 01/02/2022 25  22 - 32 mmol/L Final   Glucose, Bld 01/02/2022 216 (H)  70 - 99 mg/dL Final   Glucose reference range applies only to samples taken after fasting for at least 8 hours.   BUN 01/02/2022 11  8 - 23 mg/dL Final   Creatinine, Ser 01/02/2022 0.98  0.61 - 1.24 mg/dL Final   Calcium 01/02/2022 9.5  8.9 - 10.3 mg/dL Final   Total Protein 01/02/2022 7.4  6.5 - 8.1 g/dL Final   Albumin 01/02/2022 3.4 (L)  3.5 - 5.0 g/dL Final   AST 01/02/2022 12 (L)  15 - 41 U/L Final   ALT 01/02/2022 11  0 - 44 U/L Final   Alkaline Phosphatase 01/02/2022 90  38 - 126 U/L Final   Total Bilirubin 01/02/2022 0.9  0.3 - 1.2 mg/dL Final   GFR, Estimated  01/02/2022 >60  >60 mL/min Final   Comment: (NOTE) Calculated using the CKD-EPI Creatinine Equation (2021)    Anion gap 01/02/2022 9  5 - 15 Final   Performed at [facility] [address], [place], [place] [address]  [facility] on 11/01/2021  Component Date Value Ref Range Status   POC Glucose 11/27/2021 301 (A)  70 - 99 mg/dl Final   1 hr after lunch, had not taken insulin  Admission on  10/13/2021, Discharged on 10/15/2021  Component Date Value Ref Range Status   Glucose-Capillary 10/13/2021 388 (H)  70 - 99 mg/dL Final   Glucose reference range applies only to samples taken after fasting for at least 8 hours.   SARS Coronavirus 2 by RT PCR 10/13/2021 NEGATIVE  NEGATIVE Final   Comment: (NOTE) SARS-CoV-2 target nucleic acids are NOT DETECTED.  The SARS-CoV-2 RNA is generally detectable in upper respiratory specimens during the acute phase of infection. The lowest concentration of SARS-CoV-2 viral copies this assay can detect is 138 copies/mL. A negative result does not preclude SARS-Cov-2 infection and should not be used as the sole basis for treatment or other patient management decisions. A negative result may occur with  improper specimen collection/handling, submission of specimen other than nasopharyngeal swab, presence of viral mutation(s) within the areas targeted by this assay, and inadequate number of viral copies(<138 copies/mL). A negative result must be combined with clinical observations, patient history, and epidemiological information. The expected result is Negative.  Fact Sheet for Patients:  EntrepreneurPulse.com.au  Fact Sheet for Healthcare Providers:  IncredibleEmployment.be  This test is no                          t yet approved or cleared by the [place] FDA and  has been authorized for detection and/or diagnosis of SARS-CoV-2 by FDA under an Emergency Use Authorization (EUA). This EUA  will remain  in effect (meaning this test can be used) for the duration of the COVID-19 declaration under Section 564(b)(1) of the Act, 21 U.S.C.section 360bbb-3(b)(1), unless the authorization is terminated  or revoked sooner.       Influenza A by PCR 10/13/2021 NEGATIVE  NEGATIVE Final   Influenza B by PCR 10/13/2021 NEGATIVE  NEGATIVE Final   Comment: (NOTE) The Xpert Xpress SARS-CoV-2/FLU/RSV plus assay is intended as an aid in the diagnosis of influenza from Nasopharyngeal swab specimens and should not be used as a sole basis for treatment. Nasal washings and aspirates are unacceptable for Xpert Xpress SARS-CoV-2/FLU/RSV testing.  Fact Sheet for Patients: EntrepreneurPulse.com.au  Fact Sheet for Healthcare Providers: IncredibleEmployment.be  This test is not yet approved or cleared by the [place] FDA and has been authorized for detection and/or diagnosis of SARS-CoV-2 by FDA under an Emergency Use Authorization (EUA). This EUA will remain in effect (meaning this test can be used) for the duration of the COVID-19 declaration under Section 564(b)(1) of the Act, 21 U.S.C. section 360bbb-3(b)(1), unless the authorization is terminated or revoked.  Performed at [facility], [place] [address] [place], [place] [address]    WBC 10/13/2021 9.5  4.0 - 10.5 K/uL Final   RBC 10/13/2021 4.23  4.22 - 5.81 MIL/uL Final   Hemoglobin 10/13/2021 13.0  13.0 - 17.0 g/dL Final   HCT 10/13/2021 40.4  39.0 - 52.0 % Final   MCV 10/13/2021 95.5  80.0 - 100.0 fL Final   MCH 10/13/2021 30.7  26.0 - 34.0 pg Final   MCHC 10/13/2021 32.2  30.0 - 36.0 g/dL Final   RDW 10/13/2021 13.4  11.5 - 15.5 % Final   Platelets 10/13/2021 551 (H)  150 - 400 K/uL Final   nRBC 10/13/2021 0.0  0.0 - 0.2 % Final   Neutrophils Relative % 10/13/2021 68  % Final   Neutro Abs 10/13/2021 6.5  1.7 - 7.7 K/uL Final   Lymphocytes Relative 10/13/2021 18  % Final   Lymphs Abs  10/13/2021 1.7  0.7 - 4.0 K/uL Final   Monocytes Relative 10/13/2021 9  % Final   Monocytes Absolute 10/13/2021 0.8  0.1 - 1.0 K/uL Final   Eosinophils Relative 10/13/2021 3  % Final   Eosinophils Absolute 10/13/2021 0.3  0.0 - 0.5 K/uL Final   Basophils Relative 10/13/2021 1  % Final   Basophils Absolute 10/13/2021 0.1  0.0 - 0.1 K/uL Final   Immature Granulocytes 10/13/2021 1  % Final   Abs Immature Granulocytes 10/13/2021 0.11 (H)  0.00 - 0.07 K/uL Final   Performed at [facility], [place] [address], [place], [place] [address]   Sodium 10/13/2021 136  135 - 145 mmol/L Final   Potassium 10/13/2021 4.3  3.5 - 5.1 mmol/L Final   Chloride 10/13/2021 104  98 - 111 mmol/L Final   CO2 10/13/2021 24  22 - 32 mmol/L Final   Glucose, Bld 10/13/2021 296 (H)  70 - 99 mg/dL Final   Glucose reference range applies only to samples taken after fasting for at least 8 hours.   BUN 10/13/2021 14  8 - 23 mg/dL Final   Creatinine, Ser 10/13/2021 1.15  0.61 - 1.24 mg/dL Final   Calcium 10/13/2021 9.0  8.9 - 10.3 mg/dL Final   Total Protein 10/13/2021 7.5  6.5 - 8.1 g/dL Final   Albumin 10/13/2021 3.0 (L)  3.5 - 5.0 g/dL Final   AST 10/13/2021 13 (L)  15 - 41 U/L Final   ALT 10/13/2021 10  0 - 44 U/L Final   Alkaline Phosphatase 10/13/2021 84  38 - 126 U/L Final   Total Bilirubin 10/13/2021 0.5  0.3 - 1.2 mg/dL Final   GFR, Estimated 10/13/2021 >60  >60 mL/min Final   Comment: (NOTE) Calculated using the CKD-EPI Creatinine Equation (2021)    Anion gap 10/13/2021 8  5 - 15 Final   Performed at [facility], [place] [address], [place], [place] [address]   Color, Urine 10/13/2021 YELLOW  YELLOW Final   APPearance 10/13/2021 CLOUDY (A)  CLEAR Final   Specific Gravity, Urine 10/13/2021 1.022  1.005 - 1.030 Final   pH 10/13/2021 5.0  5.0 - 8.0 Final   Glucose, UA 10/13/2021 >=500 (A)  NEGATIVE mg/dL Final   Hgb urine dipstick 10/13/2021 LARGE (A)  NEGATIVE Final   Bilirubin Urine 10/13/2021  NEGATIVE  NEGATIVE Final   Ketones, ur 10/13/2021 NEGATIVE  NEGATIVE mg/dL Final   Protein, ur 10/13/2021 30 (A)  NEGATIVE mg/dL Final   Nitrite 10/13/2021 NEGATIVE  NEGATIVE Final   Leukocytes,Ua 10/13/2021 LARGE (A)  NEGATIVE Final   RBC / HPF 10/13/2021 >50 (H)  0 - 5 RBC/hpf Final   WBC, UA 10/13/2021 >50 (H)  0 - 5 WBC/hpf Final   Bacteria, UA 10/13/2021 RARE (A)  NONE SEEN Final   Squamous Epithelial / LPF 10/13/2021 0-5  0 - 5 Final   WBC Clumps 10/13/2021 PRESENT   Final   Mucus 10/13/2021 PRESENT   Final   Budding Yeast 10/13/2021 PRESENT   Final   Non Squamous Epithelial 10/13/2021 0-5 (A)  NONE SEEN Final   Performed at [facility], [place] [address], [place], [place] [address]   pH, Ven 10/13/2021 7.457 (H)  7.25 - 7.43 Final   pCO2, Ven 10/13/2021 38.1 (L)  44 - 60 mmHg Final   pO2, Ven 10/13/2021 174 (H)  32 - 45 mmHg Final   Bicarbonate 10/13/2021 26.9  20.0 - 28.0 mmol/L Final  TCO2 10/13/2021 28  22 - 32 mmol/L Final   O2 Saturation 10/13/2021 100  % Final   Acid-Base Excess 10/13/2021 3.0 (H)  0.0 - 2.0 mmol/L Final   Sodium 10/13/2021 137  135 - 145 mmol/L Final   Potassium 10/13/2021 4.3  3.5 - 5.1 mmol/L Final   Calcium, Ion 10/13/2021 1.10 (L)  1.15 - 1.40 mmol/L Final   HCT 10/13/2021 40.0  39.0 - 52.0 % Final   Hemoglobin 10/13/2021 13.6  13.0 - 17.0 g/dL Final   Sample type 10/13/2021 VENOUS   Final   Opiates 10/13/2021 NONE DETECTED  NONE DETECTED Final   Cocaine 10/13/2021 POSITIVE (A)  NONE DETECTED Final   Benzodiazepines 10/13/2021 NONE DETECTED  NONE DETECTED Final   Amphetamines 10/13/2021 NONE DETECTED  NONE DETECTED Final   Tetrahydrocannabinol 10/13/2021 NONE DETECTED  NONE DETECTED Final   Barbiturates 10/13/2021 NONE DETECTED  NONE DETECTED Final   Comment: (NOTE) DRUG SCREEN FOR MEDICAL PURPOSES ONLY.  IF CONFIRMATION IS NEEDED FOR ANY PURPOSE, NOTIFY LAB WITHIN 5 DAYS.  LOWEST DETECTABLE LIMITS FOR URINE DRUG SCREEN Drug Class                      Cutoff (ng/mL) Amphetamine and metabolites    1000 Barbiturate and metabolites    200 Benzodiazepine                 810 Tricyclics and metabolites     300 Opiates and metabolites        300 Cocaine and metabolites        300 THC                            50 Performed at [facility], [place] [address], [place], [place] [address]    Adenovirus 10/13/2021 NOT DETECTED  NOT DETECTED Final   Coronavirus 229E 10/13/2021 NOT DETECTED  NOT DETECTED Final   Comment: (NOTE) The Coronavirus on the Respiratory Panel, DOES NOT test for the novel  Coronavirus (2019 nCoV)    Coronavirus HKU1 10/13/2021 NOT DETECTED  NOT DETECTED Final   Coronavirus NL63 10/13/2021 NOT DETECTED  NOT DETECTED Final   Coronavirus OC43 10/13/2021 NOT DETECTED  NOT DETECTED Final   Metapneumovirus 10/13/2021 NOT DETECTED  NOT DETECTED Final   Rhinovirus / Enterovirus 10/13/2021 NOT DETECTED  NOT DETECTED Final   Influenza A 10/13/2021 NOT DETECTED  NOT DETECTED Final   Influenza B 10/13/2021 NOT DETECTED  NOT DETECTED Final   Parainfluenza Virus 1 10/13/2021 NOT DETECTED  NOT DETECTED Final   Parainfluenza Virus 2 10/13/2021 NOT DETECTED  NOT DETECTED Final   Parainfluenza Virus 3 10/13/2021 NOT DETECTED  NOT DETECTED Final   Parainfluenza Virus 4 10/13/2021 NOT DETECTED  NOT DETECTED Final   Respiratory Syncytial Virus 10/13/2021 NOT DETECTED  NOT DETECTED Final   Bordetella pertussis 10/13/2021 NOT DETECTED  NOT DETECTED Final   Bordetella Parapertussis 10/13/2021 NOT DETECTED  NOT DETECTED Final   Chlamydophila pneumoniae 10/13/2021 NOT DETECTED  NOT DETECTED Final   Mycoplasma pneumoniae 10/13/2021 NOT DETECTED  NOT DETECTED Final   Performed at [facility], [place] [address] [place], [place] [address]   Procalcitonin 10/13/2021 <0.10  ng/mL Final   Comment:        Interpretation: PCT (Procalcitonin) <= 0.5 ng/mL: Systemic infection (sepsis) is not likely. Local bacterial infection is  possible. (NOTE)       Sepsis  PCT Algorithm           Lower Respiratory Tract                                      Infection PCT Algorithm    ----------------------------     ----------------------------         PCT < 0.25 ng/mL                PCT < 0.10 ng/mL          Strongly encourage             Strongly discourage   discontinuation of antibiotics    initiation of antibiotics    ----------------------------     -----------------------------       PCT 0.25 - 0.50 ng/mL            PCT 0.10 - 0.25 ng/mL               OR       >80% decrease in PCT            Discourage initiation of                                            antibiotics      Encourage discontinuation           of antibiotics    ----------------------------     -----------------------------         PCT >= 0.50 ng/mL              PCT 0.26 - 0.50 ng/mL               AND                                 <80% decrease in PCT             Encourage initiation of                                             antibiotics       Encourage continuation           of antibiotics    ----------------------------     -----------------------------        PCT >= 0.50 ng/mL                  PCT > 0.50 ng/mL               AND         increase in PCT                  Strongly encourage                                      initiation of antibiotics    Strongly encourage escalation           of antibiotics                                     -----------------------------  PCT <= 0.25 ng/mL                                                 OR                                        > 80% decrease in PCT                                      Discontinue / Do not initiate                                             antibiotics  Performed at [facility], [place] [address] [place], [place] [address]    B Natriuretic Peptide 10/13/2021 23.6  0.0 - 100.0 pg/mL Final   Performed at [facility] [facility] [address] [place], [place] [address]   L. pneumophila Serogp 1 Ur Ag 10/13/2021 Negative  Negative Final   Comment: (NOTE) Presumptive negative for L. pneumophila serogroup 1 antigen in urine, suggesting no recent or current infection. Legionnaires' disease cannot be ruled out since other serogroups and species may also cause disease. Performed At: [facility] [place], [place] [id number] Rush Farmer MD OM:[id number]    Source of Sample 10/13/2021 URINE, RANDOM   Final   Performed at [facility], [place] [address], [place], [place] [address]   Strep Pneumo Urinary Antigen 10/13/2021 NEGATIVE  NEGATIVE Final   Comment:        Infection due to S. pneumoniae cannot be absolutely ruled out since the antigen present may be below the detection limit of the test. Performed at [facility], [address] [place], [place] [address]    Sodium 10/14/2021 138  135 - 145 mmol/L Final   Potassium 10/14/2021 4.3  3.5 - 5.1 mmol/L Final   Chloride 10/14/2021 106  98 - 111 mmol/L Final   CO2 10/14/2021 25  22 - 32 mmol/L Final   Glucose, Bld 10/14/2021 286 (H)  70 - 99 mg/dL Final   Glucose reference range applies only to samples taken after fasting for at least 8 hours.   BUN 10/14/2021 14  8 - 23 mg/dL Final   Creatinine, Ser 10/14/2021 0.98  0.61 - 1.24 mg/dL Final   Calcium 10/14/2021 8.9  8.9 - 10.3 mg/dL Final   GFR, Estimated 10/14/2021 >60  >60 mL/min Final   Comment: (NOTE) Calculated using the CKD-EPI Creatinine Equation (2021)    Anion gap 10/14/2021 7  5 - 15 Final   Performed at [facility], [place] [address] [place], [place] [address]   WBC 10/14/2021 9.6  4.0 - 10.5 K/uL Final   RBC 10/14/2021 4.12 (L)  4.22 - 5.81 MIL/uL Final   Hemoglobin 10/14/2021 12.6 (L)  13.0 - 17.0 g/dL Final   HCT 10/14/2021 38.8 (L)  39.0 - 52.0 % Final   MCV 10/14/2021 94.2  80.0 - 100.0 fL Final   MCH 10/14/2021 30.6  26.0 - 34.0 pg  Final    MCHC 10/14/2021 32.5  30.0 - 36.0 g/dL Final   RDW 10/14/2021 13.4  11.5 - 15.5 % Final   Platelets 10/14/2021 556 (H)  150 - 400 K/uL Final   nRBC 10/14/2021 0.0  0.0 - 0.2 % Final   Performed at [facility], [place] [address], [place], [place] [address]   Glucose-Capillary 10/13/2021 281 (H)  70 - 99 mg/dL Final   Glucose reference range applies only to samples taken after fasting for at least 8 hours.   Glucose-Capillary 10/13/2021 399 (H)  70 - 99 mg/dL Final   Glucose reference range applies only to samples taken after fasting for at least 8 hours.   Glucose-Capillary 10/14/2021 239 (H)  70 - 99 mg/dL Final   Glucose reference range applies only to samples taken after fasting for at least 8 hours.   Glucose-Capillary 10/14/2021 345 (H)  70 - 99 mg/dL Final   Glucose reference range applies only to samples taken after fasting for at least 8 hours.   Glucose-Capillary 10/14/2021 383 (H)  70 - 99 mg/dL Final   Glucose reference range applies only to samples taken after fasting for at least 8 hours.   Glucose-Capillary 10/15/2021 194 (H)  70 - 99 mg/dL Final   Glucose reference range applies only to samples taken after fasting for at least 8 hours.   Glucose-Capillary 10/15/2021 161 (H)  70 - 99 mg/dL Final   Glucose reference range applies only to samples taken after fasting for at least 8 hours.   Glucose-Capillary 10/15/2021 312 (H)  70 - 99 mg/dL Final   Glucose reference range applies only to samples taken after fasting for at least 8 hours.   Glucose-Capillary 10/14/2021 279 (H)  70 - 99 mg/dL Final   Glucose reference range applies only to samples taken after fasting for at least 8 hours.  Office Visit on 10/08/2021  Component Date Value Ref Range Status   POC Glucose 10/08/2021 321 (A)  70 - 99 mg/dl Final   HbA1c, POC (controlled diabetic ra* 10/08/2021 9.3 (A)  0.0 - 7.0 % Final  Admission on 10/07/2021, Discharged on 10/07/2021  Component Date Value Ref Range Status    Color, Urine 10/07/2021 YELLOW  YELLOW Final   APPearance 10/07/2021 HAZY (A)  CLEAR Final   Specific Gravity, Urine 10/07/2021 1.010  1.005 - 1.030 Final   pH 10/07/2021 5.0  5.0 - 8.0 Final   Glucose, UA 10/07/2021 >=500 (A)  NEGATIVE mg/dL Final   Hgb urine dipstick 10/07/2021 MODERATE (A)  NEGATIVE Final   Bilirubin Urine 10/07/2021 NEGATIVE  NEGATIVE Final   Ketones, ur 10/07/2021 NEGATIVE  NEGATIVE mg/dL Final   Protein, ur 10/07/2021 30 (A)  NEGATIVE mg/dL Final   Nitrite 10/07/2021 NEGATIVE  NEGATIVE Final   Leukocytes,Ua 10/07/2021 LARGE (A)  NEGATIVE Final   RBC / HPF 10/07/2021 21-50  0 - 5 RBC/hpf Final   WBC, UA 10/07/2021 >50 (H)  0 - 5 WBC/hpf Final   Bacteria, UA 10/07/2021 NONE SEEN  NONE SEEN Final   WBC Clumps 10/07/2021 PRESENT   Final   Mucus 10/07/2021 PRESENT   Final   Budding Yeast 10/07/2021 PRESENT   Final   Performed at [facility], [place] [address], [place], [place] [address]   WBC 10/07/2021 8.9  4.0 - 10.5 K/uL Final   RBC 10/07/2021 4.35  4.22 - 5.81 MIL/uL Final   Hemoglobin 10/07/2021 13.7  13.0 - 17.0 g/dL Final  HCT 10/07/2021 41.9  39.0 - 52.0 % Final   MCV 10/07/2021 96.3  80.0 - 100.0 fL Final   MCH 10/07/2021 31.5  26.0 - 34.0 pg Final   MCHC 10/07/2021 32.7  30.0 - 36.0 g/dL Final   RDW 10/07/2021 13.4  11.5 - 15.5 % Final   Platelets 10/07/2021 376  150 - 400 K/uL Final   nRBC 10/07/2021 0.0  0.0 - 0.2 % Final   Neutrophils Relative % 10/07/2021 59  % Final   Neutro Abs 10/07/2021 5.2  1.7 - 7.7 K/uL Final   Lymphocytes Relative 10/07/2021 17  % Final   Lymphs Abs 10/07/2021 1.5  0.7 - 4.0 K/uL Final   Monocytes Relative 10/07/2021 19  % Final   Monocytes Absolute 10/07/2021 1.7 (H)  0.1 - 1.0 K/uL Final   Eosinophils Relative 10/07/2021 5  % Final   Eosinophils Absolute 10/07/2021 0.5  0.0 - 0.5 K/uL Final   Basophils Relative 10/07/2021 0  % Final   Basophils Absolute 10/07/2021 0.0  0.0 - 0.1 K/uL Final   Immature  Granulocytes 10/07/2021 0  % Final   Abs Immature Granulocytes 10/07/2021 0.04  0.00 - 0.07 K/uL Final   Performed at [facility], [place] [address], [place], [place] [address]   Sodium 10/07/2021 141  135 - 145 mmol/L Final   Potassium 10/07/2021 4.0  3.5 - 5.1 mmol/L Final   Chloride 10/07/2021 107  98 - 111 mmol/L Final   CO2 10/07/2021 24  22 - 32 mmol/L Final   Glucose, Bld 10/07/2021 81  70 - 99 mg/dL Final   Glucose reference range applies only to samples taken after fasting for at least 8 hours.   BUN 10/07/2021 14  8 - 23 mg/dL Final   Creatinine, Ser 10/07/2021 1.19  0.61 - 1.24 mg/dL Final   Calcium 10/07/2021 9.4  8.9 - 10.3 mg/dL Final   Total Protein 10/07/2021 7.9  6.5 - 8.1 g/dL Final   Albumin 10/07/2021 3.5  3.5 - 5.0 g/dL Final   AST 10/07/2021 12 (L)  15 - 41 U/L Final   ALT 10/07/2021 10  0 - 44 U/L Final   Alkaline Phosphatase 10/07/2021 73  38 - 126 U/L Final   Total Bilirubin 10/07/2021 0.7  0.3 - 1.2 mg/dL Final   GFR, Estimated 10/07/2021 >60  >60 mL/min Final   Comment: (NOTE) Calculated using the CKD-EPI Creatinine Equation (2021)    Anion gap 10/07/2021 10  5 - 15 Final   Performed at [facility], [place] [address] [place], [place] [address]   Specimen Description 10/07/2021    Final                   Value:URINE, CLEAN CATCH Performed at [facility], [place] [address] [place], [place] [address]    Special Requests 10/07/2021    Final                   Value:NONE Performed at [facility], [place] [address] [place], [place] [address]    Culture 10/07/2021  (A)   Final                   Value:40,000 COLONIES/mL YEAST 20,000 COLONIES/mL LACTOBACILLUS SPECIES Standardized susceptibility testing for this organism is not available. Performed at [facility], [place] [address] [place], [place] [address]    Report Status 10/07/2021 10/08/2021 FINAL   Final  Admission  on 10/04/2021,  Discharged on 10/04/2021  Component Date Value Ref Range Status   WBC 10/04/2021 9.3  4.0 - 10.5 K/uL Final   RBC 10/04/2021 4.26  4.22 - 5.81 MIL/uL Final   Hemoglobin 10/04/2021 13.4  13.0 - 17.0 g/dL Final   HCT 10/04/2021 41.5  39.0 - 52.0 % Final   MCV 10/04/2021 97.4  80.0 - 100.0 fL Final   MCH 10/04/2021 31.5  26.0 - 34.0 pg Final   MCHC 10/04/2021 32.3  30.0 - 36.0 g/dL Final   RDW 10/04/2021 13.6  11.5 - 15.5 % Final   Platelets 10/04/2021 367  150 - 400 K/uL Final   nRBC 10/04/2021 0.0  0.0 - 0.2 % Final   Performed at [facility], [place] [address], [place], [place] [address]   Sodium 10/04/2021 138  135 - 145 mmol/L Final   Potassium 10/04/2021 4.4  3.5 - 5.1 mmol/L Final   Chloride 10/04/2021 107  98 - 111 mmol/L Final   CO2 10/04/2021 22  22 - 32 mmol/L Final   Glucose, Bld 10/04/2021 236 (H)  70 - 99 mg/dL Final   Glucose reference range applies only to samples taken after fasting for at least 8 hours.   BUN 10/04/2021 13  8 - 23 mg/dL Final   Creatinine, Ser 10/04/2021 1.15  0.61 - 1.24 mg/dL Final   Calcium 10/04/2021 8.9  8.9 - 10.3 mg/dL Final   GFR, Estimated 10/04/2021 >60  >60 mL/min Final   Comment: (NOTE) Calculated using the CKD-EPI Creatinine Equation (2021)    Anion gap 10/04/2021 9  5 - 15 Final   Performed at [facility], [place] [address] [place], [place] [address]  There may be more visits with results that are not included.   Blood Alcohol level:  Lab Results  Component Value Date   ETH <10 01/20/2022   ETH <10 01/05/2535   Metabolic Disorder Labs: Lab Results  Component Value Date   HGBA1C 10.4 (H) 01/21/2022   MPG 251.78 01/21/2022   MPG >398 02/24/2021   No results found for: "PROLACTIN" Lab Results  Component Value Date   CHOL 213 (H) 05/23/2021   TRIG 115 05/23/2021   HDL 71 05/23/2021   CHOLHDL 3.2 11/03/2019   VLDL 32 (H) 05/03/2015   LDLCALC 122 (H) 05/23/2021   LDLCALC 99 11/03/2019    Therapeutic Lab Levels: No results found for: "LITHIUM" No results found for: "VALPROATE" No results found for: "CBMZ" Physical Findings   GAD-7    Flowsheet Row Office Visit from 10/08/2021 in [place] Office Visit from 09/05/2021 in [place] [facility] Office Visit from 12/26/2020 in [place] Office Visit from 11/17/2018 in [place] Office Visit from 06/05/2017 in [place]  Total GAD-7 Score _0 0 12      PHQ2-9    [place] ED from 01/21/2022 in [facility] Office Visit from 10/08/2021 in [place] Office Visit from 09/05/2021 in [place] [facility] ED from 07/02/2021 in [facility] Office Visit from 12/26/2020 in [place]  PHQ-2 Total Score _1 PHQ-9 Total Score -- _2 Flowsheet Row ED from 01/21/2022 in [facility] ED from 01/20/2022 in [facility]  [place] ED from 01/18/2022 in [place] CATEGORY Low Risk Low Risk No Risk       Musculoskeletal  Strength & Muscle Tone: within normal limits Gait & Station: normal Patient leans: forward   Psychiatric Specialty Exam   Presentation  General Appearance:Disheveled Eye Contact:None Speech:Slow (Mostly clear and coherent.  Mumbling at times.) Volume:Decreased Handedness:Right  Mood and Affect  Mood: (Tired) Affect:Appropriate, Congruent, Depressed, Restricted  Thought Process  Thought Process:Coherent, Goal Directed, Linear Descriptions of Associations:Intact  Thought Content Suicidal Thoughts:Suicidal Thoughts: No (Last time was 11/12.  Contracted to safety.) Homicidal Thoughts:Homicidal Thoughts: No Hallucinations:Hallucinations:  None (Denied AVH) Ideas of Reference:None Thought Content:WDL  Sensorium  Memory:Immediate Good Judgment:Fair Insight:Fair  Executive Functions  Orientation:Full (Time, Place and Person) Language:Good Concentration:Good Attention:Good [place] of Knowledge:Good  Psychomotor Activity  Psychomotor Activity:Psychomotor Activity: Psychomotor Retardation  Assets  Assets:Communication Skills, Desire for Improvement, Leisure Time  Sleep  Quality:Good  Physical Exam  BP 99/61 (BP Location: Right Arm)   Pulse 66   Temp 98.2 F (36.8 C) (Oral)   Resp 16   SpO2 95%  Physical Exam Vitals and nursing note reviewed.  Constitutional:      General: He is not in acute distress.    Appearance: He is ill-appearing. He is not toxic-appearing or diaphoretic.  HENT:     Head: Normocephalic.  Pulmonary:     Effort: Pulmonary effort is normal. No respiratory distress.  Neurological:     Mental Status: He is alert.      Assessment / Plan  Total Time spent with patient: 15 minutes Treatment Plan Summary: Daily contact with patient to assess and evaluate symptoms and progress in treatment and Medication management  Principal Problem:   Cocaine use disorder, severe, dependence ([place]) Active Problems:   Essential hypertension, benign   Hyperlipidemia   Tobacco abuse   Uncontrolled type 2 diabetes mellitus with hyperglycemia ([place])   Dermoid cyst of skin of back   David Wyatt is a 61 y.o. male with PMH cocaine use disorder, tobacco use disorder, housing instability, DM2 + neuropathy, HTN, HLD, no inpatient psych admission, no suicide attempts, who initially presented to [place] (01/20/2022) for passive SI and assistance with rehab for crack cocaine use.  Patient was then IVC by EDP, which was rescinded on 01/21/2022 by Mayra Neer, MD.  He was then admitted Voluntary to [facility] (01/21/2022) via EMS for assistance with residential placement.      Patient presented as lethargic, but able  to attend meals and groups. This was likely due to uncontrolled CGBs, which were initially ~300s. His biggest concern was housing to stay away from cocaine. Barrier to residential placement has been his insulin-dependency.  Total duration of encounter: 3 days  Cocaine use d/o  Tobacco use d/o  Action stage. Encouraged cessation Comfort PRNs   Substance induced mood d/o Continued home Cymbalta 60 mg daily  DM2-insulin-dependent with neuropathy, IMPROVING A1c 10.4 (01/21/2022), from 9.7 earlier this year. Goal CBGs <180. CBGs <200s, no hypoglycemia. Per RN, patient was noted to eat packets of sugars in between meals. This has now been removed from patient access.  Continued home metformin 1000 mg twice daily Continued home glipizide 10 mg with breakfast Continued home empagliflozin 10 mg with breakfast Continued home insulin Long-acting 24 units daily Sensitive sliding scale Continued Gabapentin 600 mg TID and Cymbalta per above for neuropathy Educated on self foot exam twice daily  Low TSH T3 and T4  within normal limits   Clinical Course as of 01/24/22 1421  Tue Jan 22, 2022  0850 TSH(!): 0.248 [JN]  0850 Hemoglobin A1C(!): 10.4 [JN]  Wed Jan 23, 2022  0758 T4,Free(Direct): 0.72 [JN]  Thu Jan 24, 2022  1420 Triiodothyronine,Free,Serum: 2.2 [JN]    Clinical Course User Index [JN] Merrily Brittle, DO  Labs at [facility]: UDS+cocaine. CBC showed plt 445, otherwise within normal limits CMP showed glucose of 360, otherwise unremarkable.  AST/ALT wnl. BAL <71, salicylate level <7   Considerations for follow-up: Recommend high risk screening every 6-12 months with HIV, RPR, hepatitis panel PCP for insulin dependent DM2 - uncontrolled  DISPO: Tentative date: TBA Location: Likely [facility] in [place] or [place] Unable to go to residential rehab due to insulin dependency  Signed: Merrily Brittle, DO Psychiatry Resident, PGY-2 01/24/2022, 2:17 PM   [facility] [place], [place] [address] Dept: [phone] Dept Fax: [phone]

## 2022-01-24 NOTE — ED Notes (Signed)
Pt asleep in bed. Respirations even and unlabored. Monitoring for safety. 

## 2022-01-24 NOTE — ED Notes (Signed)
Patient observed/assessed in bed/chair resting quietly appearing with no distress and verbalizing no complaints at this time. Will continue to monitor.

## 2022-01-24 NOTE — ED Notes (Addendum)
Pt up to nurse's station requesting water, a snack, and medication to help him sleep. Pt states he has been tossing and turning all night. Evette Georges, NP made aware. Pt also states he does not want to go to [facility] because it is 1 year long, he would rather go to residential treatment for about a month. RN advised will make care team aware.

## 2022-01-24 NOTE — ED Notes (Signed)
Patient observed/assessed on unit in dining room watching TV. Patient alert and oriented x 4. Patient denies pain and anxiety. Patient states that appetite has been good throughout the day and that he last had a BM this morning without issue. He Denies SI/H/I, A/V/H, and anxiety. Will continue to monitor/support.

## 2022-01-24 NOTE — ED Notes (Signed)
In a group with [place]

## 2022-01-24 NOTE — ED Notes (Signed)
SPIRITUALITY GROUP NOTE  Spirituality group facilitated by Simone Curia, MDiv, [place].  Group Description: Group focused on topic of hope. Patients participated in facilitated discussion around topic, connecting with one another around experiences and definitions for hope. Group engaged in discussion around how their definitions of hope are present today in hospital.  Modalities: Psycho-social ed, Adlerian, Narrative, MI  Patient Progress: David Wyatt was present throughout group.  Actively engaged in discussion.

## 2022-01-24 NOTE — ED Notes (Signed)
1 Gold fish snacks given

## 2022-01-24 NOTE — Discharge Planning (Signed)
LCSW contacted [name] in Admission at [facility]  regarding referral. Per [name], only half of the referral was received. Email provided for full referral to be sent to [name]. Per [name], she will follow up on tomorrow regarding updates. Patient was provided update and reports he is appreciative of LCSW assistance with treatment at this time. No other needs were reported at this time.    LCSW will continue to follow and provide support to patient while in hospital.    David Wyatt, [place] Worker [facility] Ph: [phone]

## 2022-01-25 DIAGNOSIS — Z720 Tobacco use: Secondary | ICD-10-CM | POA: Diagnosis not present

## 2022-01-25 DIAGNOSIS — I10 Essential (primary) hypertension: Secondary | ICD-10-CM | POA: Diagnosis not present

## 2022-01-25 DIAGNOSIS — E785 Hyperlipidemia, unspecified: Secondary | ICD-10-CM | POA: Diagnosis not present

## 2022-01-25 DIAGNOSIS — F1410 Cocaine abuse, uncomplicated: Secondary | ICD-10-CM | POA: Diagnosis not present

## 2022-01-25 DIAGNOSIS — F1420 Cocaine dependence, uncomplicated: Secondary | ICD-10-CM | POA: Diagnosis not present

## 2022-01-25 LAB — GLUCOSE, CAPILLARY
Glucose-Capillary: 137 mg/dL — ABNORMAL HIGH (ref 70–99)
Glucose-Capillary: 201 mg/dL — ABNORMAL HIGH (ref 70–99)
Glucose-Capillary: 195 mg/dL — ABNORMAL HIGH (ref 70–99)
Glucose-Capillary: 149 mg/dL — ABNORMAL HIGH (ref 70–99)
Glucose-Capillary: 147 mg/dL — ABNORMAL HIGH (ref 70–99)

## 2022-01-25 NOTE — ED Notes (Signed)
Patient observed/assessed in room in bed appearing in no immediate distress resting peacefully. Q15 minute checks continued by MHT and nursing staff. Will continue to monitor and support. 

## 2022-01-25 NOTE — ED Provider Notes (Signed)
[facility] Based Crisis Behavioral Health Progress Note  Date & Time: 01/25/2022 12:08 PM Name: David Wyatt Age: 61 y.o.  DOB: 10-29-1960  MRN: [id number]  Diagnosis:  Final diagnoses:  Dermoid cyst of skin of back  Tobacco abuse  Diabetic polyneuropathy associated with type 2 diabetes mellitus ([place])  Cocaine use disorder, severe, dependence ([place])    Reason for presentation: Suicidal and Addiction Problem  Brief HPI  David Wyatt is a 61 y.o. male, with PMH cocaine use disorder, tobacco use disorder, housing instability, DM2 + neuropathy, HTN, HLD, no inpatient psych admission, no suicide attempts, who initially presented to [facility] (01/20/2022) for passive SI and assistance with rehab for crack cocaine use.  Patient was then IVC by EDP, which was rescinded on 01/21/2022 by Mayra Neer, MD.  He was then admitted Voluntary to [facility] (01/21/2022) via EMS for assistance with residential placement.       Interval Hx   Patient Narrative:   Patient stated that his energy continues to improve.  Denied any questions or concerns.  Stated that he has been practicing self administering his insulin.  CH:[id number] Thoughts: No (Last time was 11/12.  Contracted to safety.) JO:[id number] Thoughts: No NOB:[id number]: None (Denied AVH)  Mood:  (Tired) Sleep:Good Appetite: Good Review of Systems  Constitutional:  Negative for malaise/fatigue.  Respiratory:  Negative for shortness of breath.   Cardiovascular:  Negative for chest pain.  Gastrointestinal:  Negative for abdominal pain, constipation, diarrhea, nausea and vomiting.  Genitourinary:  Negative for frequency.  Neurological:  Negative for dizziness, tremors and headaches.  Endo/Heme/Allergies:  Negative for polydipsia.     Past History   Psychiatric History:  Dx: cocaine use disorder, tobacco use disorder Prior Rx: Patient unsure OP psychiatrist: Denied OP therapist: Denied PCP: None Suicide Attempt: Denied  Inpatient  psych: Denied  Psychiatric Family History: Patient unsure  Social History:   Living: Housing instability Income: Disabilities, $900/mo Social support: Sister in [place], none in [place]  EtOH:  reports no history of alcohol use.  Tobacco:  reports that he has been smoking cigarettes. He has a 7.50 pack-year smoking history. He quit smokeless tobacco use about 6 years ago.  Cannabis: Denied Opiates: Denied Stimulants: Crack cocaine - smokes BZO/hypnotics: Denied Seizure/DT: Denied Treatments: Yes IVDU: Denied  Past Medical History:  Past Medical History:  Diagnosis Date   Angioedema of lips 07/28/2012   left upper (07/29/2012)   Arthritis    hands and back   Chronic back pain    Cocaine abuse (HCC)    Diabetic neuropathy (HCC)    High cholesterol    Hypertension    Neuropathy    Type II diabetes mellitus ([place])     Past Surgical History:  Procedure Laterality Date   BACK SURGERY     CYSTOSCOPY W/ URETERAL STENT PLACEMENT Right 02/23/2021   Procedure: CYSTOSCOPY WITH RETROGRADE PYELOGRAM/URETERAL STENT PLACEMENT;  Surgeon: Janith Lima, MD;  Location: [facility];  Service: Urology;  Laterality: Right;   HERNIA REPAIR Right 04/01/2012   I & D EXTREMITY Left 12/13/2012   Procedure: IRRIGATION AND DEBRIDEMENT LEFT THUMB;  Surgeon: Tennis Must, MD;  Location: [facility];  Service: Orthopedics;  Laterality: Left;   INCISION AND DRAINAGE OF WOUND     boil on back/notes 07/14/2008 (07/29/2012)   INGUINAL HERNIA REPAIR  04/01/2012   Procedure: HERNIA REPAIR INGUINAL ADULT;  Surgeon: Haywood Lasso, MD;  Location: [facility];  Service: General;  Laterality: Right;   INGUINAL HERNIA REPAIR  Left 09/06/2016   Procedure: OPEN REPAIR LEFT INGUINAL HERNIA;  Surgeon: Greer Pickerel, MD;  Location: [facility];  Service: General;  Laterality: Left;   INSERTION OF MESH Left 09/06/2016   Procedure: INSERTION OF MESH;  Surgeon: Greer Pickerel, MD;  Location: [facility];  Service: General;  Laterality: Left;   TONSILLECTOMY      Family History:  Family History  Problem Relation Age of Onset   Diabetes Mother    Kidney disease Mother    Hyperlipidemia Mother    Hyperlipidemia Father    Diabetes Father    Social History   Substance and Sexual Activity  Alcohol Use No   Alcohol/week: 0.0 standard drinks of alcohol    Social History   Substance and Sexual Activity  Drug Use Not Currently   Types: "Crack" cocaine    Social History   Socioeconomic History   Marital status: Divorced    Spouse name: Not on file   Number of children: 3   Years of education: 12   Highest education level: Not on file  Occupational History    Comment: disabled  Tobacco Use   Smoking status: Every Day    Packs/day: 0.25    Years: 30.00    Total pack years: 7.50    Types: Cigarettes   Smokeless tobacco: Former    Quit date: 08/15/2015   Tobacco comments:    04/29/16 2  cigs daily, 10/27/17 sometimes < .25 PPD  Vaping Use   Vaping Use: Never used  Substance and Sexual Activity   Alcohol use: No    Alcohol/week: 0.0 standard drinks of alcohol   Drug use: Not Currently    Types: "Crack" cocaine   Sexual activity: Not on file  Other Topics Concern   Not on file  Social History Narrative   04/29/17   Lives in shelter   Caffeine- a lot of  tea, coffee   Social Determinants of Health   Financial Resource Strain: Not on file  Food Insecurity: Not on file  Transportation Needs: Not on file  Physical Activity: Not on file  Stress: Not on file  Social Connections: Not on file   SDOH: SDOH Screenings   Depression (PHQ2-9): Low Risk  (01/22/2022)  Tobacco Use: High Risk (01/23/2022)   Additional Social History:   Current Medications   Current Facility-Administered Medications  Medication Dose Route Frequency Provider Last Rate Last Admin   acetaminophen (TYLENOL) tablet 650 mg  650 mg Oral Q6H PRN Merrily Brittle, DO       alum & mag hydroxide-simeth (MAALOX/MYLANTA) 200-200-20 MG/5ML suspension 30 mL  30 mL  Oral Q4H PRN Merrily Brittle, DO       amLODipine (NORVASC) tablet 10 mg  10 mg Oral Daily Merrily Brittle, DO   10 mg at 01/25/22 1043   atorvastatin (LIPITOR) tablet 40 mg  40 mg Oral Daily Merrily Brittle, DO   40 mg at 01/25/22 1043   DULoxetine (CYMBALTA) DR capsule 60 mg  60 mg Oral Daily Merrily Brittle, DO   60 mg at 01/25/22 1043   empagliflozin (JARDIANCE) tablet 10 mg  10 mg Oral QAC breakfast Merrily Brittle, DO   10 mg at 01/25/22 0820   feeding supplement (GLUCERNA SHAKE) (GLUCERNA SHAKE) liquid 237 mL  237 mL Oral TID BM Merrily Brittle, DO   237 mL at 01/25/22 1044   gabapentin (NEURONTIN) capsule 600 mg  600 mg Oral TID Merrily Brittle, DO   600 mg at 01/25/22 1042  glipiZIDE (GLUCOTROL XL) 24 hr tablet 10 mg  10 mg Oral Q breakfast Merrily Brittle, DO   10 mg at 01/25/22 0830   insulin aspart (novoLOG) injection 0-6 Units  0-6 Units Subcutaneous TID WC Merrily Brittle, DO   2 Units at 01/25/22 0820   insulin aspart (novoLOG) injection 5 Units  5 Units Subcutaneous BID AC Merrily Brittle, DO   5 Units at 01/24/22 1708   insulin glargine-yfgn (SEMGLEE) injection 24 Units  24 Units Subcutaneous Daily Minda Ditto, RPH   24 Units at 01/25/22 1042   lubriderm seriously sensitive lotion 1 Application  1 Application Topical BID Merrily Brittle, DO   1 Application at 28/41/32 2214   magnesium hydroxide (MILK OF MAGNESIA) suspension 30 mL  30 mL Oral Daily PRN Merrily Brittle, DO       metFORMIN (GLUCOPHAGE) tablet 1,000 mg  1,000 mg Oral BID WC Merrily Brittle, DO   1,000 mg at 01/25/22 0820   nicotine (NICODERM CQ - dosed in mg/24 hours) patch 21 mg  21 mg Transdermal Q0600 Merrily Brittle, DO   21 mg at 01/24/22 4401   nicotine polacrilex (NICORETTE) gum 4 mg  4 mg Oral PRN Merrily Brittle, DO       tamsulosin [facility]) capsule 0.4 mg  0.4 mg Oral Daily Merrily Brittle, DO   0.4 mg at 01/25/22 1043   traZODone (DESYREL) tablet 50 mg  50 mg Oral QHS PRN Evette Georges, NP   50 mg at 01/24/22 2115   Current Outpatient  Medications  Medication Sig Dispense Refill   Accu-Chek Softclix Lancets lancets Use up to 4 times daily as directed 100 each 0   amLODipine (NORVASC) 10 MG tablet Take 1 tablet (10 mg total) by mouth daily. 30 tablet 0   atorvastatin (LIPITOR) 40 MG tablet Take 1 tablet (40 mg total) by mouth daily. 30 tablet 0   Blood Glucose Monitoring Suppl (ACCU-CHEK GUIDE) w/Device KIT USE AS DIRECTED TO CHECK BLOOD SUGARS UP TO TWICE PER DAY 1 kit 0   DULoxetine (CYMBALTA) 60 MG capsule Take 1 capsule (60 mg total) by mouth daily. 30 capsule 0   empagliflozin (JARDIANCE) 10 MG TABS tablet Take 1 tablet (10 mg total) by mouth daily before breakfast. 30 tablet 0   gabapentin (NEURONTIN) 300 MG capsule Take 2 capsules (600 mg total) by mouth 3 (three) times daily. 180 capsule 0   glipiZIDE (GLUCOTROL XL) 10 MG 24 hr tablet Take 1 tablet (10 mg total) by mouth daily with breakfast. 30 tablet 0   glucose blood (ACCU-CHEK GUIDE) test strip Use up to 3 times daily as directed 100 each 3   Insulin Glargine Solostar (LANTUS) 100 UNIT/ML Solostar Pen Inject 24 Units into the skin daily. 15 mL 2   insulin lispro (HUMALOG) 100 UNIT/ML KwikPen Inject 5 Units into the skin 2 (two) times daily before lunch and supper. (Patient not taking: Reported on 01/20/2022) 6 mL 6   Insulin Pen Needle 31G X 8 MM MISC Use with insulin pen 100 each 1   metFORMIN (GLUCOPHAGE) 1000 MG tablet Take 1 tablet (1,000 mg total) by mouth 2 (two) times daily. 60 tablet 0   tamsulosin (FLOMAX) 0.4 MG CAPS capsule Take 1 capsule (0.4 mg total) by mouth daily. For renal calculi 30 capsule 0    Labs / Images  Lab Results:  Admission on 01/21/2022  Component Date Value Ref Range Status   Hgb A1c MFr Bld 01/21/2022 10.4 (H)  4.8 -  5.6 % Final   Comment: (NOTE) Pre diabetes:          5.7%-6.4%  Diabetes:              >6.4%  Glycemic control for   <7.0% adults with diabetes    Mean Plasma Glucose 01/21/2022 251.78  mg/dL Final   Performed  at [facility], [place] [address] [place], [place] [address]   TSH 01/21/2022 0.248 (L)  0.350 - 4.500 uIU/mL Final   Comment: Performed by a 3rd Generation assay with a functional sensitivity of <=0.01 uIU/mL. Performed at [facility], [place] [address] [place], [place] [address]    Glucose-Capillary 01/21/2022 374 (H)  70 - 99 mg/dL Final   Glucose reference range applies only to samples taken after fasting for at least 8 hours.   Glucose-Capillary 01/21/2022 289 (H)  70 - 99 mg/dL Final   Glucose reference range applies only to samples taken after fasting for at least 8 hours.   Glucose-Capillary 01/22/2022 137 (H)  70 - 99 mg/dL Final   Glucose reference range applies only to samples taken after fasting for at least 8 hours.   Glucose-Capillary 01/22/2022 297 (H)  70 - 99 mg/dL Final   Glucose reference range applies only to samples taken after fasting for at least 8 hours.   T3, Free 01/23/2022 2.2  2.0 - 4.4 pg/mL Final   Comment: (NOTE) Performed At: [facility] [place], [place] [id number] Rush Farmer MD ZS:[id number]    Glucose-Capillary 01/22/2022 94  70 - 99 mg/dL Final   Glucose reference range applies only to samples taken after fasting for at least 8 hours.   Free T4 01/23/2022 0.72  0.61 - 1.12 ng/dL Final   Comment: (NOTE) Biotin ingestion may interfere with free T4 tests. If the results are inconsistent with the TSH level, previous test results, or the clinical presentation, then consider biotin interference. If needed, order repeat testing after stopping biotin. Performed at [facility], [place] [address], [place], [place] [address]    TSH 01/23/2022 0.203 (L)  0.350 - 4.500 uIU/mL Final   Comment: Performed by a 3rd Generation assay with a functional sensitivity of <=0.01 uIU/mL. Performed at [facility], [place] [address] [place], [place] [address]    Glucose-Capillary 01/23/2022 132 (H)  70 - 99 mg/dL Final   Glucose  reference range applies only to samples taken after fasting for at least 8 hours.   Glucose-Capillary 01/22/2022 59 (L)  70 - 99 mg/dL Final   Glucose reference range applies only to samples taken after fasting for at least 8 hours.   Glucose-Capillary 01/22/2022 177 (H)  70 - 99 mg/dL Final   Glucose reference range applies only to samples taken after fasting for at least 8 hours.   Glucose-Capillary 01/23/2022 284 (H)  70 - 99 mg/dL Final   Glucose reference range applies only to samples taken after fasting for at least 8 hours.   Glucose-Capillary 01/23/2022 188 (H)  70 - 99 mg/dL Final   Glucose reference range applies only to samples taken after fasting for at least 8 hours.   Glucose-Capillary 01/23/2022 294 (H)  70 - 99 mg/dL Final   Glucose reference range applies only to samples taken after fasting for at least 8 hours.   Glucose-Capillary 01/24/2022 180 (H)  70 - 99 mg/dL Final   Glucose reference range applies only to samples taken after fasting for at least  8 hours.   Glucose-Capillary 01/24/2022 275 (H)  70 - 99 mg/dL Final   Glucose reference range applies only to samples taken after fasting for at least 8 hours.   Glucose-Capillary 01/24/2022 146 (H)  70 - 99 mg/dL Final   Glucose reference range applies only to samples taken after fasting for at least 8 hours.   Glucose-Capillary 01/24/2022 137 (H)  70 - 99 mg/dL Final   Glucose reference range applies only to samples taken after fasting for at least 8 hours.   Glucose-Capillary 01/25/2022 201 (H)  70 - 99 mg/dL Final   Glucose reference range applies only to samples taken after fasting for at least 8 hours.  Admission on 01/20/2022, Discharged on 01/21/2022  Component Date Value Ref Range Status   SARS Coronavirus 2 by RT PCR 01/20/2022 NEGATIVE  NEGATIVE Final   Comment: (NOTE) SARS-CoV-2 target nucleic acids are NOT DETECTED.  The SARS-CoV-2 RNA is generally detectable in upper respiratory specimens during the acute  phase of infection. The lowest concentration of SARS-CoV-2 viral copies this assay can detect is 138 copies/mL. A negative result does not preclude SARS-Cov-2 infection and should not be used as the sole basis for treatment or other patient management decisions. A negative result may occur with  improper specimen collection/handling, submission of specimen other than nasopharyngeal swab, presence of viral mutation(s) within the areas targeted by this assay, and inadequate number of viral copies(<138 copies/mL). A negative result must be combined with clinical observations, patient history, and epidemiological information. The expected result is Negative.  Fact Sheet for Patients:  EntrepreneurPulse.com.au  Fact Sheet for Healthcare Providers:  IncredibleEmployment.be  This test is no                          t yet approved or cleared by the [place] FDA and  has been authorized for detection and/or diagnosis of SARS-CoV-2 by FDA under an Emergency Use Authorization (EUA). This EUA will remain  in effect (meaning this test can be used) for the duration of the COVID-19 declaration under Section 564(b)(1) of the Act, 21 U.S.C.section 360bbb-3(b)(1), unless the authorization is terminated  or revoked sooner.       Influenza A by PCR 01/20/2022 NEGATIVE  NEGATIVE Final   Influenza B by PCR 01/20/2022 NEGATIVE  NEGATIVE Final   Comment: (NOTE) The Xpert Xpress SARS-CoV-2/FLU/RSV plus assay is intended as an aid in the diagnosis of influenza from Nasopharyngeal swab specimens and should not be used as a sole basis for treatment. Nasal washings and aspirates are unacceptable for Xpert Xpress SARS-CoV-2/FLU/RSV testing.  Fact Sheet for Patients: EntrepreneurPulse.com.au  Fact Sheet for Healthcare Providers: IncredibleEmployment.be  This test is not yet approved or cleared by the [place] FDA and has  been authorized for detection and/or diagnosis of SARS-CoV-2 by FDA under an Emergency Use Authorization (EUA). This EUA will remain in effect (meaning this test can be used) for the duration of the COVID-19 declaration under Section 564(b)(1) of the Act, 21 U.S.C. section 360bbb-3(b)(1), unless the authorization is terminated or revoked.  Performed at [facility], [place] [address] [place], [place] [address]    Sodium 01/20/2022 136  135 - 145 mmol/L Final   Potassium 01/20/2022 4.0  3.5 - 5.1 mmol/L Final   Chloride 01/20/2022 103  98 - 111 mmol/L Final   CO2 01/20/2022 24  22 - 32 mmol/L Final   Glucose, Bld 01/20/2022 360 (H)  70 -  99 mg/dL Final   Glucose reference range applies only to samples taken after fasting for at least 8 hours.   BUN 01/20/2022 12  8 - 23 mg/dL Final   Creatinine, Ser 01/20/2022 1.01  0.61 - 1.24 mg/dL Final   Calcium 01/20/2022 9.4  8.9 - 10.3 mg/dL Final   Total Protein 01/20/2022 7.3  6.5 - 8.1 g/dL Final   Albumin 01/20/2022 3.4 (L)  3.5 - 5.0 g/dL Final   AST 01/20/2022 14 (L)  15 - 41 U/L Final   ALT 01/20/2022 10  0 - 44 U/L Final   Alkaline Phosphatase 01/20/2022 101  38 - 126 U/L Final   Total Bilirubin 01/20/2022 0.3  0.3 - 1.2 mg/dL Final   GFR, Estimated 01/20/2022 >60  >60 mL/min Final   Comment: (NOTE) Calculated using the CKD-EPI Creatinine Equation (2021)    Anion gap 01/20/2022 9  5 - 15 Final   Performed at [place] [address], [place], [place] [address]   Alcohol, Ethyl (B) 01/20/2022 <10  <10 mg/dL Final   Comment: (NOTE) Lowest detectable limit for serum alcohol is 10 mg/dL.  For medical purposes only. Performed at [facility], [place] [address] [place], [place] [address]    Opiates 01/20/2022 NONE DETECTED  NONE DETECTED Final   Cocaine 01/20/2022 POSITIVE (A)  NONE DETECTED Final   Benzodiazepines 01/20/2022 NONE DETECTED  NONE DETECTED Final   Amphetamines 01/20/2022 NONE DETECTED  NONE DETECTED  Final   Tetrahydrocannabinol 01/20/2022 NONE DETECTED  NONE DETECTED Final   Barbiturates 01/20/2022 NONE DETECTED  NONE DETECTED Final   Comment: (NOTE) DRUG SCREEN FOR MEDICAL PURPOSES ONLY.  IF CONFIRMATION IS NEEDED FOR ANY PURPOSE, NOTIFY LAB WITHIN 5 DAYS.  LOWEST DETECTABLE LIMITS FOR URINE DRUG SCREEN Drug Class                     Cutoff (ng/mL) Amphetamine and metabolites    1000 Barbiturate and metabolites    200 Benzodiazepine                 200 Opiates and metabolites        300 Cocaine and metabolites        300 THC                            50 Performed at [facility], [place] [address] [place], [place] [address]    WBC 01/20/2022 5.2  4.0 - 10.5 K/uL Final   RBC 01/20/2022 4.73  4.22 - 5.81 MIL/uL Final   Hemoglobin 01/20/2022 14.3  13.0 - 17.0 g/dL Final   HCT 01/20/2022 43.1  39.0 - 52.0 % Final   MCV 01/20/2022 91.1  80.0 - 100.0 fL Final   MCH 01/20/2022 30.2  26.0 - 34.0 pg Final   MCHC 01/20/2022 33.2  30.0 - 36.0 g/dL Final   RDW 01/20/2022 14.1  11.5 - 15.5 % Final   Platelets 01/20/2022 444 (H)  150 - 400 K/uL Final   nRBC 01/20/2022 0.0  0.0 - 0.2 % Final   Neutrophils Relative % 01/20/2022 61  % Final   Neutro Abs 01/20/2022 3.2  1.7 - 7.7 K/uL Final   Lymphocytes Relative 01/20/2022 29  % Final   Lymphs Abs 01/20/2022 1.5  0.7 - 4.0 K/uL Final   Monocytes Relative 01/20/2022 7  % Final   Monocytes Absolute 01/20/2022 0.4  0.1 -  1.0 K/uL Final   Eosinophils Relative 01/20/2022 2  % Final   Eosinophils Absolute 01/20/2022 0.1  0.0 - 0.5 K/uL Final   Basophils Relative 01/20/2022 1  % Final   Basophils Absolute 01/20/2022 0.0  0.0 - 0.1 K/uL Final   Immature Granulocytes 01/20/2022 0  % Final   Abs Immature Granulocytes 01/20/2022 0.01  0.00 - 0.07 K/uL Final   Performed at [facility], [place] [address], [place], [place] [address]   Salicylate Lvl 57/84/6962 <7.0 (L)  7.0 - 30.0 mg/dL Final   Performed at [place] [address] [place], [place] [address]   Acetaminophen (Tylenol), Serum 01/20/2022 <10 (L)  10 - 30 ug/mL Final   Comment: (NOTE) Therapeutic concentrations vary significantly. A range of 10-30 ug/mL  may be an effective concentration for many patients. However, some  are best treated at concentrations outside of this range. Acetaminophen concentrations >150 ug/mL at 4 hours after ingestion  and >50 ug/mL at 12 hours after ingestion are often associated with  toxic reactions.  Performed at [facility], [place] [address] [place], [place] [address]    Glucose-Capillary 01/20/2022 344 (H)  70 - 99 mg/dL Final   Glucose reference range applies only to samples taken after fasting for at least 8 hours.   Comment 1 01/20/2022 Notify RN   Final   Comment 2 01/20/2022 Document in Chart   Final   Glucose-Capillary 01/20/2022 479 (H)  70 - 99 mg/dL Final   Glucose reference range applies only to samples taken after fasting for at least 8 hours.   Glucose-Capillary 01/21/2022 243 (H)  70 - 99 mg/dL Final   Glucose reference range applies only to samples taken after fasting for at least 8 hours.   Glucose-Capillary 01/21/2022 224 (H)  70 - 99 mg/dL Final   Glucose reference range applies only to samples taken after fasting for at least 8 hours.   Glucose-Capillary 01/21/2022 282 (H)  70 - 99 mg/dL Final   Glucose reference range applies only to samples taken after fasting for at least 8 hours.  Admission on 01/18/2022, Discharged on 01/19/2022  Component Date Value Ref Range Status   WBC 01/18/2022 5.3  4.0 - 10.5 K/uL Final   RBC 01/18/2022 4.47  4.22 - 5.81 MIL/uL Final   Hemoglobin 01/18/2022 13.7  13.0 - 17.0 g/dL Final   HCT 01/18/2022 41.0  39.0 - 52.0 % Final   MCV 01/18/2022 91.7  80.0 - 100.0 fL Final   MCH 01/18/2022 30.6  26.0 - 34.0 pg Final   MCHC 01/18/2022 33.4  30.0 - 36.0 g/dL Final   RDW 01/18/2022 14.2  11.5 - 15.5 % Final   Platelets 01/18/2022 445 (H)  150 - 400 K/uL Final    nRBC 01/18/2022 0.0  0.0 - 0.2 % Final   Performed at [facility], [place] [address], [place], [place] [address]   Sodium 01/18/2022 136  135 - 145 mmol/L Final   Potassium 01/18/2022 4.3  3.5 - 5.1 mmol/L Final   Chloride 01/18/2022 100  98 - 111 mmol/L Final   CO2 01/18/2022 22  22 - 32 mmol/L Final   Glucose, Bld 01/18/2022 499 (H)  70 - 99 mg/dL Final   Glucose reference range applies only to samples taken after fasting for at least 8 hours.   BUN 01/18/2022 9  8 - 23 mg/dL Final   Creatinine, Ser 01/18/2022 1.23  0.61 -  1.24 mg/dL Final   Calcium 01/18/2022 9.1  8.9 - 10.3 mg/dL Final   Total Protein 01/18/2022 6.6  6.5 - 8.1 g/dL Final   Albumin 01/18/2022 3.2 (L)  3.5 - 5.0 g/dL Final   AST 01/18/2022 18  15 - 41 U/L Final   ALT 01/18/2022 9  0 - 44 U/L Final   Alkaline Phosphatase 01/18/2022 95  38 - 126 U/L Final   Total Bilirubin 01/18/2022 0.3  0.3 - 1.2 mg/dL Final   GFR, Estimated 01/18/2022 >60  >60 mL/min Final   Comment: (NOTE) Calculated using the CKD-EPI Creatinine Equation (2021)    Anion gap 01/18/2022 14  5 - 15 Final   Performed at [place] [address] [place], [place] [address]   Color, Urine 01/18/2022 YELLOW  YELLOW Final   APPearance 01/18/2022 CLEAR  CLEAR Final   Specific Gravity, Urine 01/18/2022 1.025  1.005 - 1.030 Final   pH 01/18/2022 6.0  5.0 - 8.0 Final   Glucose, UA 01/18/2022 >=500 (A)  NEGATIVE mg/dL Final   Hgb urine dipstick 01/18/2022 NEGATIVE  NEGATIVE Final   Bilirubin Urine 01/18/2022 NEGATIVE  NEGATIVE Final   Ketones, ur 01/18/2022 NEGATIVE  NEGATIVE mg/dL Final   Protein, ur 01/18/2022 NEGATIVE  NEGATIVE mg/dL Final   Nitrite 01/18/2022 NEGATIVE  NEGATIVE Final   Leukocytes,Ua 01/18/2022 SMALL (A)  NEGATIVE Final   RBC / HPF 01/18/2022 0-5  0 - 5 RBC/hpf Final   WBC, UA 01/18/2022 21-50  0 - 5 WBC/hpf Final   Bacteria, UA 01/18/2022 RARE (A)  NONE SEEN Final   Squamous Epithelial / LPF 01/18/2022 0-5  0 - 5  Final   Budding Yeast 01/18/2022 PRESENT   Final   Performed at [facility], [place] [address], [place], [place] [address]   Opiates 01/18/2022 NONE DETECTED  NONE DETECTED Final   Cocaine 01/18/2022 POSITIVE (A)  NONE DETECTED Final   Benzodiazepines 01/18/2022 NONE DETECTED  NONE DETECTED Final   Amphetamines 01/18/2022 NONE DETECTED  NONE DETECTED Final   Tetrahydrocannabinol 01/18/2022 NONE DETECTED  NONE DETECTED Final   Barbiturates 01/18/2022 NONE DETECTED  NONE DETECTED Final   Comment: (NOTE) DRUG SCREEN FOR MEDICAL PURPOSES ONLY.  IF CONFIRMATION IS NEEDED FOR ANY PURPOSE, NOTIFY LAB WITHIN 5 DAYS.  LOWEST DETECTABLE LIMITS FOR URINE DRUG SCREEN Drug Class                     Cutoff (ng/mL) Amphetamine and metabolites    1000 Barbiturate and metabolites    200 Benzodiazepine                 200 Opiates and metabolites        300 Cocaine and metabolites        300 THC                            50 Performed at [facility], [place] [address] [place], [place] [address]    Glucose-Capillary 01/18/2022 483 (H)  70 - 99 mg/dL Final   Glucose reference range applies only to samples taken after fasting for at least 8 hours.   Glucose-Capillary 01/19/2022 586 (HH)  70 - 99 mg/dL Final   Glucose reference range applies only to samples taken after fasting for at least 8 hours.   Glucose-Capillary 01/19/2022 456 (H)  70 - 99 mg/dL Final   Glucose reference range applies  only to samples taken after fasting for at least 8 hours.   Glucose-Capillary 01/19/2022 515 (HH)  70 - 99 mg/dL Final   Glucose reference range applies only to samples taken after fasting for at least 8 hours.   Comment 1 01/19/2022 Notify RN   Final   Glucose-Capillary 01/19/2022 164 (H)  70 - 99 mg/dL Final   Glucose reference range applies only to samples taken after fasting for at least 8 hours.  Admission on 01/07/2022, Discharged on 01/07/2022  Component Date Value Ref Range Status    Glucose-Capillary 01/07/2022 267 (H)  70 - 99 mg/dL Final   Glucose reference range applies only to samples taken after fasting for at least 8 hours.  Admission on 01/02/2022, Discharged on 01/02/2022  Component Date Value Ref Range Status   WBC 01/02/2022 7.0  4.0 - 10.5 K/uL Final   RBC 01/02/2022 4.54  4.22 - 5.81 MIL/uL Final   Hemoglobin 01/02/2022 13.9  13.0 - 17.0 g/dL Final   HCT 01/02/2022 41.4  39.0 - 52.0 % Final   MCV 01/02/2022 91.2  80.0 - 100.0 fL Final   MCH 01/02/2022 30.6  26.0 - 34.0 pg Final   MCHC 01/02/2022 33.6  30.0 - 36.0 g/dL Final   RDW 01/02/2022 14.1  11.5 - 15.5 % Final   Platelets 01/02/2022 369  150 - 400 K/uL Final   nRBC 01/02/2022 0.0  0.0 - 0.2 % Final   Neutrophils Relative % 01/02/2022 59  % Final   Neutro Abs 01/02/2022 4.1  1.7 - 7.7 K/uL Final   Lymphocytes Relative 01/02/2022 28  % Final   Lymphs Abs 01/02/2022 1.9  0.7 - 4.0 K/uL Final   Monocytes Relative 01/02/2022 8  % Final   Monocytes Absolute 01/02/2022 0.5  0.1 - 1.0 K/uL Final   Eosinophils Relative 01/02/2022 4  % Final   Eosinophils Absolute 01/02/2022 0.3  0.0 - 0.5 K/uL Final   Basophils Relative 01/02/2022 1  % Final   Basophils Absolute 01/02/2022 0.1  0.0 - 0.1 K/uL Final   Immature Granulocytes 01/02/2022 0  % Final   Abs Immature Granulocytes 01/02/2022 0.02  0.00 - 0.07 K/uL Final   Performed at [facility], [place] [address], [place], [place] [address]   Sodium 01/02/2022 138  135 - 145 mmol/L Final   Potassium 01/02/2022 4.1  3.5 - 5.1 mmol/L Final   Chloride 01/02/2022 104  98 - 111 mmol/L Final   CO2 01/02/2022 25  22 - 32 mmol/L Final   Glucose, Bld 01/02/2022 216 (H)  70 - 99 mg/dL Final   Glucose reference range applies only to samples taken after fasting for at least 8 hours.   BUN 01/02/2022 11  8 - 23 mg/dL Final   Creatinine, Ser 01/02/2022 0.98  0.61 - 1.24 mg/dL Final   Calcium 01/02/2022 9.5  8.9 - 10.3 mg/dL Final   Total Protein 01/02/2022 7.4  6.5 -  8.1 g/dL Final   Albumin 01/02/2022 3.4 (L)  3.5 - 5.0 g/dL Final   AST 01/02/2022 12 (L)  15 - 41 U/L Final   ALT 01/02/2022 11  0 - 44 U/L Final   Alkaline Phosphatase 01/02/2022 90  38 - 126 U/L Final   Total Bilirubin 01/02/2022 0.9  0.3 - 1.2 mg/dL Final   GFR, Estimated 01/02/2022 >60  >60 mL/min Final   Comment: (NOTE) Calculated using the CKD-EPI Creatinine Equation (2021)    Anion gap 01/02/2022 9  5 -  15 Final   Performed at [facility], [place] [address] [place], [place] [address]  [facility] on 11/01/2021  Component Date Value Ref Range Status   POC Glucose 11/27/2021 301 (A)  70 - 99 mg/dl Final   1 hr after lunch, had not taken insulin  Admission on 10/13/2021, Discharged on 10/15/2021  Component Date Value Ref Range Status   Glucose-Capillary 10/13/2021 388 (H)  70 - 99 mg/dL Final   Glucose reference range applies only to samples taken after fasting for at least 8 hours.   SARS Coronavirus 2 by RT PCR 10/13/2021 NEGATIVE  NEGATIVE Final   Comment: (NOTE) SARS-CoV-2 target nucleic acids are NOT DETECTED.  The SARS-CoV-2 RNA is generally detectable in upper respiratory specimens during the acute phase of infection. The lowest concentration of SARS-CoV-2 viral copies this assay can detect is 138 copies/mL. A negative result does not preclude SARS-Cov-2 infection and should not be used as the sole basis for treatment or other patient management decisions. A negative result may occur with  improper specimen collection/handling, submission of specimen other than nasopharyngeal swab, presence of viral mutation(s) within the areas targeted by this assay, and inadequate number of viral copies(<138 copies/mL). A negative result must be combined with clinical observations, patient history, and epidemiological information. The expected result is Negative.  Fact Sheet for Patients:  EntrepreneurPulse.com.au  Fact Sheet for Healthcare  Providers:  IncredibleEmployment.be  This test is no                          t yet approved or cleared by the [place] FDA and  has been authorized for detection and/or diagnosis of SARS-CoV-2 by FDA under an Emergency Use Authorization (EUA). This EUA will remain  in effect (meaning this test can be used) for the duration of the COVID-19 declaration under Section 564(b)(1) of the Act, 21 U.S.C.section 360bbb-3(b)(1), unless the authorization is terminated  or revoked sooner.       Influenza A by PCR 10/13/2021 NEGATIVE  NEGATIVE Final   Influenza B by PCR 10/13/2021 NEGATIVE  NEGATIVE Final   Comment: (NOTE) The Xpert Xpress SARS-CoV-2/FLU/RSV plus assay is intended as an aid in the diagnosis of influenza from Nasopharyngeal swab specimens and should not be used as a sole basis for treatment. Nasal washings and aspirates are unacceptable for Xpert Xpress SARS-CoV-2/FLU/RSV testing.  Fact Sheet for Patients: EntrepreneurPulse.com.au  Fact Sheet for Healthcare Providers: IncredibleEmployment.be  This test is not yet approved or cleared by the [place] FDA and has been authorized for detection and/or diagnosis of SARS-CoV-2 by FDA under an Emergency Use Authorization (EUA). This EUA will remain in effect (meaning this test can be used) for the duration of the COVID-19 declaration under Section 564(b)(1) of the Act, 21 U.S.C. section 360bbb-3(b)(1), unless the authorization is terminated or revoked.  Performed at [facility], [place] [address], [place], [place] [address]    WBC 10/13/2021 9.5  4.0 - 10.5 K/uL Final   RBC 10/13/2021 4.23  4.22 - 5.81 MIL/uL Final   Hemoglobin 10/13/2021 13.0  13.0 - 17.0 g/dL Final   HCT 10/13/2021 40.4  39.0 - 52.0 % Final   MCV 10/13/2021 95.5  80.0 - 100.0 fL Final   MCH 10/13/2021 30.7  26.0 - 34.0 pg Final   MCHC 10/13/2021 32.2  30.0 - 36.0 g/dL Final   RDW 10/13/2021  13.4  11.5 - 15.5 % Final  Platelets 10/13/2021 551 (H)  150 - 400 K/uL Final   nRBC 10/13/2021 0.0  0.0 - 0.2 % Final   Neutrophils Relative % 10/13/2021 68  % Final   Neutro Abs 10/13/2021 6.5  1.7 - 7.7 K/uL Final   Lymphocytes Relative 10/13/2021 18  % Final   Lymphs Abs 10/13/2021 1.7  0.7 - 4.0 K/uL Final   Monocytes Relative 10/13/2021 9  % Final   Monocytes Absolute 10/13/2021 0.8  0.1 - 1.0 K/uL Final   Eosinophils Relative 10/13/2021 3  % Final   Eosinophils Absolute 10/13/2021 0.3  0.0 - 0.5 K/uL Final   Basophils Relative 10/13/2021 1  % Final   Basophils Absolute 10/13/2021 0.1  0.0 - 0.1 K/uL Final   Immature Granulocytes 10/13/2021 1  % Final   Abs Immature Granulocytes 10/13/2021 0.11 (H)  0.00 - 0.07 K/uL Final   Performed at [facility], [place] [address], [place], [place] [address]   Sodium 10/13/2021 136  135 - 145 mmol/L Final   Potassium 10/13/2021 4.3  3.5 - 5.1 mmol/L Final   Chloride 10/13/2021 104  98 - 111 mmol/L Final   CO2 10/13/2021 24  22 - 32 mmol/L Final   Glucose, Bld 10/13/2021 296 (H)  70 - 99 mg/dL Final   Glucose reference range applies only to samples taken after fasting for at least 8 hours.   BUN 10/13/2021 14  8 - 23 mg/dL Final   Creatinine, Ser 10/13/2021 1.15  0.61 - 1.24 mg/dL Final   Calcium 10/13/2021 9.0  8.9 - 10.3 mg/dL Final   Total Protein 10/13/2021 7.5  6.5 - 8.1 g/dL Final   Albumin 10/13/2021 3.0 (L)  3.5 - 5.0 g/dL Final   AST 10/13/2021 13 (L)  15 - 41 U/L Final   ALT 10/13/2021 10  0 - 44 U/L Final   Alkaline Phosphatase 10/13/2021 84  38 - 126 U/L Final   Total Bilirubin 10/13/2021 0.5  0.3 - 1.2 mg/dL Final   GFR, Estimated 10/13/2021 >60  >60 mL/min Final   Comment: (NOTE) Calculated using the CKD-EPI Creatinine Equation (2021)    Anion gap 10/13/2021 8  5 - 15 Final   Performed at [facility], [place] [address] [place], [place] [address]   Color, Urine 10/13/2021 YELLOW  YELLOW Final   APPearance  10/13/2021 CLOUDY (A)  CLEAR Final   Specific Gravity, Urine 10/13/2021 1.022  1.005 - 1.030 Final   pH 10/13/2021 5.0  5.0 - 8.0 Final   Glucose, UA 10/13/2021 >=500 (A)  NEGATIVE mg/dL Final   Hgb urine dipstick 10/13/2021 LARGE (A)  NEGATIVE Final   Bilirubin Urine 10/13/2021 NEGATIVE  NEGATIVE Final   Ketones, ur 10/13/2021 NEGATIVE  NEGATIVE mg/dL Final   Protein, ur 10/13/2021 30 (A)  NEGATIVE mg/dL Final   Nitrite 10/13/2021 NEGATIVE  NEGATIVE Final   Leukocytes,Ua 10/13/2021 LARGE (A)  NEGATIVE Final   RBC / HPF 10/13/2021 >50 (H)  0 - 5 RBC/hpf Final   WBC, UA 10/13/2021 >50 (H)  0 - 5 WBC/hpf Final   Bacteria, UA 10/13/2021 RARE (A)  NONE SEEN Final   Squamous Epithelial / LPF 10/13/2021 0-5  0 - 5 Final   WBC Clumps 10/13/2021 PRESENT   Final   Mucus 10/13/2021 PRESENT   Final   Budding Yeast 10/13/2021 PRESENT   Final   Non Squamous Epithelial 10/13/2021 0-5 (A)  NONE SEEN Final   Performed at [facility], [address] [place], [place] [address]   pH, Ven 10/13/2021 7.457 (H)  7.25 - 7.43 Final   pCO2, Ven 10/13/2021 38.1 (L)  44 - 60 mmHg Final   pO2, Ven 10/13/2021 174 (H)  32 - 45 mmHg Final   Bicarbonate 10/13/2021 26.9  20.0 - 28.0 mmol/L Final   TCO2 10/13/2021 28  22 - 32 mmol/L Final   O2 Saturation 10/13/2021 100  % Final   Acid-Base Excess 10/13/2021 3.0 (H)  0.0 - 2.0 mmol/L Final   Sodium 10/13/2021 137  135 - 145 mmol/L Final   Potassium 10/13/2021 4.3  3.5 - 5.1 mmol/L Final   Calcium, Ion 10/13/2021 1.10 (L)  1.15 - 1.40 mmol/L Final   HCT 10/13/2021 40.0  39.0 - 52.0 % Final   Hemoglobin 10/13/2021 13.6  13.0 - 17.0 g/dL Final   Sample type 10/13/2021 VENOUS   Final   Opiates 10/13/2021 NONE DETECTED  NONE DETECTED Final   Cocaine 10/13/2021 POSITIVE (A)  NONE DETECTED Final   Benzodiazepines 10/13/2021 NONE DETECTED  NONE DETECTED Final   Amphetamines 10/13/2021 NONE DETECTED  NONE DETECTED Final   Tetrahydrocannabinol 10/13/2021 NONE  DETECTED  NONE DETECTED Final   Barbiturates 10/13/2021 NONE DETECTED  NONE DETECTED Final   Comment: (NOTE) DRUG SCREEN FOR MEDICAL PURPOSES ONLY.  IF CONFIRMATION IS NEEDED FOR ANY PURPOSE, NOTIFY LAB WITHIN 5 DAYS.  LOWEST DETECTABLE LIMITS FOR URINE DRUG SCREEN Drug Class                     Cutoff (ng/mL) Amphetamine and metabolites    1000 Barbiturate and metabolites    200 Benzodiazepine                 035 Tricyclics and metabolites     300 Opiates and metabolites        300 Cocaine and metabolites        300 THC                            50 Performed at [facility], [place] [address] [place], [place] [address]    Adenovirus 10/13/2021 NOT DETECTED  NOT DETECTED Final   Coronavirus 229E 10/13/2021 NOT DETECTED  NOT DETECTED Final   Comment: (NOTE) The Coronavirus on the Respiratory Panel, DOES NOT test for the novel  Coronavirus (2019 nCoV)    Coronavirus HKU1 10/13/2021 NOT DETECTED  NOT DETECTED Final   Coronavirus NL63 10/13/2021 NOT DETECTED  NOT DETECTED Final   Coronavirus OC43 10/13/2021 NOT DETECTED  NOT DETECTED Final   Metapneumovirus 10/13/2021 NOT DETECTED  NOT DETECTED Final   Rhinovirus / Enterovirus 10/13/2021 NOT DETECTED  NOT DETECTED Final   Influenza A 10/13/2021 NOT DETECTED  NOT DETECTED Final   Influenza B 10/13/2021 NOT DETECTED  NOT DETECTED Final   Parainfluenza Virus 1 10/13/2021 NOT DETECTED  NOT DETECTED Final   Parainfluenza Virus 2 10/13/2021 NOT DETECTED  NOT DETECTED Final   Parainfluenza Virus 3 10/13/2021 NOT DETECTED  NOT DETECTED Final   Parainfluenza Virus 4 10/13/2021 NOT DETECTED  NOT DETECTED Final   Respiratory Syncytial Virus 10/13/2021 NOT DETECTED  NOT DETECTED Final   Bordetella pertussis 10/13/2021 NOT DETECTED  NOT DETECTED Final   Bordetella Parapertussis 10/13/2021 NOT DETECTED  NOT DETECTED Final   Chlamydophila pneumoniae 10/13/2021 NOT DETECTED  NOT DETECTED Final   Mycoplasma pneumoniae 10/13/2021 NOT  DETECTED  NOT DETECTED Final  Performed at [facility], [place] [address] [place], [place] [address]   Procalcitonin 10/13/2021 <0.10  ng/mL Final   Comment:        Interpretation: PCT (Procalcitonin) <= 0.5 ng/mL: Systemic infection (sepsis) is not likely. Local bacterial infection is possible. (NOTE)       Sepsis PCT Algorithm           Lower Respiratory Tract                                      Infection PCT Algorithm    ----------------------------     ----------------------------         PCT < 0.25 ng/mL                PCT < 0.10 ng/mL          Strongly encourage             Strongly discourage   discontinuation of antibiotics    initiation of antibiotics    ----------------------------     -----------------------------       PCT 0.25 - 0.50 ng/mL            PCT 0.10 - 0.25 ng/mL               OR       >80% decrease in PCT            Discourage initiation of                                            antibiotics      Encourage discontinuation           of antibiotics    ----------------------------     -----------------------------         PCT >= 0.50 ng/mL              PCT 0.26 - 0.50 ng/mL               AND                                 <80% decrease in PCT             Encourage initiation of                                             antibiotics       Encourage continuation           of antibiotics    ----------------------------     -----------------------------        PCT >= 0.50 ng/mL                  PCT > 0.50 ng/mL               AND         increase in PCT                  Strongly encourage  initiation of antibiotics    Strongly encourage escalation           of antibiotics                                     -----------------------------                                           PCT <= 0.25 ng/mL                                                 OR                                        > 80% decrease in PCT                                       Discontinue / Do not initiate                                             antibiotics  Performed at [facility], [address] [place], [place] [address]    B Natriuretic Peptide 10/13/2021 23.6  0.0 - 100.0 pg/mL Final   Performed at [place] [address] [place], [place] [address]   L. pneumophila Serogp 1 Ur Ag 10/13/2021 Negative  Negative Final   Comment: (NOTE) Presumptive negative for L. pneumophila serogroup 1 antigen in urine, suggesting no recent or current infection. Legionnaires' disease cannot be ruled out since other serogroups and species may also cause disease. Performed At: [facility] [place], [place] [id number] Rush Farmer MD ST:[id number]    Source of Sample 10/13/2021 URINE, RANDOM   Final   Performed at [facility], [place] [address], [place], [place] [address]   Strep Pneumo Urinary Antigen 10/13/2021 NEGATIVE  NEGATIVE Final   Comment:        Infection due to S. pneumoniae cannot be absolutely ruled out since the antigen present may be below the detection limit of the test. Performed at [facility], [address], [place], [place] [address]    Sodium 10/14/2021 138  135 - 145 mmol/L Final   Potassium 10/14/2021 4.3  3.5 - 5.1 mmol/L Final   Chloride 10/14/2021 106  98 - 111 mmol/L Final   CO2 10/14/2021 25  22 - 32 mmol/L Final   Glucose, Bld 10/14/2021 286 (H)  70 - 99 mg/dL Final   Glucose reference range applies only to samples taken after fasting for at least 8 hours.   BUN 10/14/2021 14  8 - 23 mg/dL Final   Creatinine, Ser 10/14/2021 0.98  0.61 - 1.24 mg/dL Final   Calcium 10/14/2021 8.9  8.9 - 10.3 mg/dL Final   GFR, Estimated 10/14/2021 >60  >60 mL/min Final   Comment: (NOTE) Calculated using  the CKD-EPI Creatinine Equation (2021)    Anion gap 10/14/2021 7  5 - 15 Final   Performed at [facility], [place] [address], [place], [place] [address]    WBC 10/14/2021 9.6  4.0 - 10.5 K/uL Final   RBC 10/14/2021 4.12 (L)  4.22 - 5.81 MIL/uL Final   Hemoglobin 10/14/2021 12.6 (L)  13.0 - 17.0 g/dL Final   HCT 10/14/2021 38.8 (L)  39.0 - 52.0 % Final   MCV 10/14/2021 94.2  80.0 - 100.0 fL Final   MCH 10/14/2021 30.6  26.0 - 34.0 pg Final   MCHC 10/14/2021 32.5  30.0 - 36.0 g/dL Final   RDW 10/14/2021 13.4  11.5 - 15.5 % Final   Platelets 10/14/2021 556 (H)  150 - 400 K/uL Final   nRBC 10/14/2021 0.0  0.0 - 0.2 % Final   Performed at [facility], [place] [address] [place], [place] [address]   Glucose-Capillary 10/13/2021 281 (H)  70 - 99 mg/dL Final   Glucose reference range applies only to samples taken after fasting for at least 8 hours.   Glucose-Capillary 10/13/2021 399 (H)  70 - 99 mg/dL Final   Glucose reference range applies only to samples taken after fasting for at least 8 hours.   Glucose-Capillary 10/14/2021 239 (H)  70 - 99 mg/dL Final   Glucose reference range applies only to samples taken after fasting for at least 8 hours.   Glucose-Capillary 10/14/2021 345 (H)  70 - 99 mg/dL Final   Glucose reference range applies only to samples taken after fasting for at least 8 hours.   Glucose-Capillary 10/14/2021 383 (H)  70 - 99 mg/dL Final   Glucose reference range applies only to samples taken after fasting for at least 8 hours.   Glucose-Capillary 10/15/2021 194 (H)  70 - 99 mg/dL Final   Glucose reference range applies only to samples taken after fasting for at least 8 hours.   Glucose-Capillary 10/15/2021 161 (H)  70 - 99 mg/dL Final   Glucose reference range applies only to samples taken after fasting for at least 8 hours.   Glucose-Capillary 10/15/2021 312 (H)  70 - 99 mg/dL Final   Glucose reference range applies only to samples taken after fasting for at least 8 hours.   Glucose-Capillary 10/14/2021 279 (H)  70 - 99 mg/dL Final   Glucose reference range applies only to samples taken after fasting for at least 8 hours.   Office Visit on 10/08/2021  Component Date Value Ref Range Status   POC Glucose 10/08/2021 321 (A)  70 - 99 mg/dl Final   HbA1c, POC (controlled diabetic ra* 10/08/2021 9.3 (A)  0.0 - 7.0 % Final  Admission on 10/07/2021, Discharged on 10/07/2021  Component Date Value Ref Range Status   Color, Urine 10/07/2021 YELLOW  YELLOW Final   APPearance 10/07/2021 HAZY (A)  CLEAR Final   Specific Gravity, Urine 10/07/2021 1.010  1.005 - 1.030 Final   pH 10/07/2021 5.0  5.0 - 8.0 Final   Glucose, UA 10/07/2021 >=500 (A)  NEGATIVE mg/dL Final   Hgb urine dipstick 10/07/2021 MODERATE (A)  NEGATIVE Final   Bilirubin Urine 10/07/2021 NEGATIVE  NEGATIVE Final   Ketones, ur 10/07/2021 NEGATIVE  NEGATIVE mg/dL Final   Protein, ur 10/07/2021 30 (A)  NEGATIVE mg/dL Final   Nitrite 10/07/2021 NEGATIVE  NEGATIVE Final   Leukocytes,Ua 10/07/2021 LARGE (A)  NEGATIVE Final   RBC / HPF 10/07/2021 21-50  0 - 5 RBC/hpf  Final   WBC, UA 10/07/2021 >50 (H)  0 - 5 WBC/hpf Final   Bacteria, UA 10/07/2021 NONE SEEN  NONE SEEN Final   WBC Clumps 10/07/2021 PRESENT   Final   Mucus 10/07/2021 PRESENT   Final   Budding Yeast 10/07/2021 PRESENT   Final   Performed at [facility], [place] [address], [place], [place] [address]   WBC 10/07/2021 8.9  4.0 - 10.5 K/uL Final   RBC 10/07/2021 4.35  4.22 - 5.81 MIL/uL Final   Hemoglobin 10/07/2021 13.7  13.0 - 17.0 g/dL Final   HCT 10/07/2021 41.9  39.0 - 52.0 % Final   MCV 10/07/2021 96.3  80.0 - 100.0 fL Final   MCH 10/07/2021 31.5  26.0 - 34.0 pg Final   MCHC 10/07/2021 32.7  30.0 - 36.0 g/dL Final   RDW 10/07/2021 13.4  11.5 - 15.5 % Final   Platelets 10/07/2021 376  150 - 400 K/uL Final   nRBC 10/07/2021 0.0  0.0 - 0.2 % Final   Neutrophils Relative % 10/07/2021 59  % Final   Neutro Abs 10/07/2021 5.2  1.7 - 7.7 K/uL Final   Lymphocytes Relative 10/07/2021 17  % Final   Lymphs Abs 10/07/2021 1.5  0.7 - 4.0 K/uL Final   Monocytes Relative 10/07/2021 19   % Final   Monocytes Absolute 10/07/2021 1.7 (H)  0.1 - 1.0 K/uL Final   Eosinophils Relative 10/07/2021 5  % Final   Eosinophils Absolute 10/07/2021 0.5  0.0 - 0.5 K/uL Final   Basophils Relative 10/07/2021 0  % Final   Basophils Absolute 10/07/2021 0.0  0.0 - 0.1 K/uL Final   Immature Granulocytes 10/07/2021 0  % Final   Abs Immature Granulocytes 10/07/2021 0.04  0.00 - 0.07 K/uL Final   Performed at [facility], [place] [address], [place], [place] [address]   Sodium 10/07/2021 141  135 - 145 mmol/L Final   Potassium 10/07/2021 4.0  3.5 - 5.1 mmol/L Final   Chloride 10/07/2021 107  98 - 111 mmol/L Final   CO2 10/07/2021 24  22 - 32 mmol/L Final   Glucose, Bld 10/07/2021 81  70 - 99 mg/dL Final   Glucose reference range applies only to samples taken after fasting for at least 8 hours.   BUN 10/07/2021 14  8 - 23 mg/dL Final   Creatinine, Ser 10/07/2021 1.19  0.61 - 1.24 mg/dL Final   Calcium 10/07/2021 9.4  8.9 - 10.3 mg/dL Final   Total Protein 10/07/2021 7.9  6.5 - 8.1 g/dL Final   Albumin 10/07/2021 3.5  3.5 - 5.0 g/dL Final   AST 10/07/2021 12 (L)  15 - 41 U/L Final   ALT 10/07/2021 10  0 - 44 U/L Final   Alkaline Phosphatase 10/07/2021 73  38 - 126 U/L Final   Total Bilirubin 10/07/2021 0.7  0.3 - 1.2 mg/dL Final   GFR, Estimated 10/07/2021 >60  >60 mL/min Final   Comment: (NOTE) Calculated using the CKD-EPI Creatinine Equation (2021)    Anion gap 10/07/2021 10  5 - 15 Final   Performed at [facility], [place] [address], [place], [place] [address]   Specimen Description 10/07/2021    Final                   Value:URINE, CLEAN CATCH Performed at [facility], [place] [address], [place],  [address]    Special Requests 10/07/2021    Final  Value:NONE Performed at [facility], [place] [address], [place], [place] [address]    Culture 10/07/2021  (A)   Final                   Value:40,000  COLONIES/mL YEAST 20,000 COLONIES/mL LACTOBACILLUS SPECIES Standardized susceptibility testing for this organism is not available. Performed at [facility], [place] [address] [place], [place] [address]    Report Status 10/07/2021 10/08/2021 FINAL   Final  Admission on 10/04/2021, Discharged on 10/04/2021  Component Date Value Ref Range Status   WBC 10/04/2021 9.3  4.0 - 10.5 K/uL Final   RBC 10/04/2021 4.26  4.22 - 5.81 MIL/uL Final   Hemoglobin 10/04/2021 13.4  13.0 - 17.0 g/dL Final   HCT 10/04/2021 41.5  39.0 - 52.0 % Final   MCV 10/04/2021 97.4  80.0 - 100.0 fL Final   MCH 10/04/2021 31.5  26.0 - 34.0 pg Final   MCHC 10/04/2021 32.3  30.0 - 36.0 g/dL Final   RDW 10/04/2021 13.6  11.5 - 15.5 % Final   Platelets 10/04/2021 367  150 - 400 K/uL Final   nRBC 10/04/2021 0.0  0.0 - 0.2 % Final   Performed at [facility], [place] [address], [place], [place] [address]   Sodium 10/04/2021 138  135 - 145 mmol/L Final   Potassium 10/04/2021 4.4  3.5 - 5.1 mmol/L Final   Chloride 10/04/2021 107  98 - 111 mmol/L Final   CO2 10/04/2021 22  22 - 32 mmol/L Final   Glucose, Bld 10/04/2021 236 (H)  70 - 99 mg/dL Final   Glucose reference range applies only to samples taken after fasting for at least 8 hours.   BUN 10/04/2021 13  8 - 23 mg/dL Final   Creatinine, Ser 10/04/2021 1.15  0.61 - 1.24 mg/dL Final   Calcium 10/04/2021 8.9  8.9 - 10.3 mg/dL Final   GFR, Estimated 10/04/2021 >60  >60 mL/min Final   Comment: (NOTE) Calculated using the CKD-EPI Creatinine Equation (2021)    Anion gap 10/04/2021 9  5 - 15 Final   Performed at [facility], [place] [address] [place],  [address]  There may be more visits with results that are not included.   Blood Alcohol level:  Lab Results  Component Value Date   ETH <10 01/20/2022   ETH <10 63/14/9702   Metabolic Disorder Labs: Lab Results  Component Value Date   HGBA1C 10.4 (H) 01/21/2022   MPG 251.78  01/21/2022   MPG >398 02/24/2021   No results found for: "PROLACTIN" Lab Results  Component Value Date   CHOL 213 (H) 05/23/2021   TRIG 115 05/23/2021   HDL 71 05/23/2021   CHOLHDL 3.2 11/03/2019   VLDL 32 (H) 05/03/2015   LDLCALC 122 (H) 05/23/2021   LDLCALC 99 11/03/2019   Therapeutic Lab Levels: No results found for: "LITHIUM" No results found for: "VALPROATE" No results found for: "CBMZ" Physical Findings   GAD-7    Flowsheet Row Office Visit from 10/08/2021 in [place] Office Visit from 09/05/2021 in [place] [facility] Office Visit from 12/26/2020 in [place] Office Visit from 11/17/2018 in [place] Office Visit from 06/05/2017 in [place]  Total GAD-7 Score _0 0 12      PHQ2-9    [place] ED from 01/21/2022 in [place]  [place] Office Visit from 10/08/2021 in [place] Office Visit from 09/05/2021 in [place] [facility] ED from 07/02/2021 in [facility] Office Visit from 12/26/2020 in [place]  PHQ-2 Total Score _0 PHQ-9 Total Score -- _1 Flowsheet Row ED from 01/21/2022 in [facility] ED from 01/20/2022 in [place] ED from 01/18/2022 in [place] CATEGORY Low Risk Low Risk No Risk       Musculoskeletal  Strength & Muscle Tone: within normal limits Gait & Station: normal Patient leans: forward   Psychiatric Specialty Exam   Presentation  General Appearance:Disheveled Eye Contact:None Speech:Slow (Mostly clear and coherent.  Mumbling at times.) Volume:Decreased Handedness:Right  Mood and Affect  Mood: (Tired) Affect:Appropriate, Congruent, Depressed,  Restricted  Thought Process  Thought Process:Coherent, Goal Directed, Linear Descriptions of Associations:Intact  Thought Content Suicidal Thoughts:Suicidal Thoughts: No (Last time was 11/12.  Contracted to safety.) Homicidal Thoughts:Homicidal Thoughts: No Hallucinations:Hallucinations: None (Denied AVH) Ideas of Reference:None Thought Content:WDL  Sensorium  Memory:Immediate Good Judgment:Fair Insight:Fair  Executive Functions  Orientation:Full (Time, Place and Person) Language:Good Concentration:Good Attention:Good [place] of Knowledge:Good  Psychomotor Activity  Psychomotor Activity:Psychomotor Activity: Psychomotor Retardation  Assets  Assets:Communication Skills, Desire for Improvement, Leisure Time  Sleep  Quality:Good  Physical Exam  BP 130/83 (BP Location: Left Arm)   Pulse 83   Temp 98.5 F (36.9 C) (Oral)   Resp 18   SpO2 97%  Physical Exam Vitals and nursing note reviewed.  Constitutional:      General: He is not in acute distress.    Appearance: He is ill-appearing. He is not toxic-appearing or diaphoretic.  HENT:     Head: Normocephalic.  Pulmonary:     Effort: Pulmonary effort is normal. No respiratory distress.  Neurological:     Mental Status: He is alert.      Assessment / Plan  Total Time spent with patient: 15 minutes Treatment Plan Summary: Daily contact with patient to assess and evaluate symptoms and progress in treatment and Medication management  Principal Problem:   Cocaine use disorder, severe, dependence ([place]) Active Problems:   Essential hypertension, benign   Hyperlipidemia   Tobacco abuse   Uncontrolled type 2 diabetes mellitus with hyperglycemia ([place])   Dermoid cyst of skin of back   David Wyatt is a 61 y.o. male with PMH cocaine use disorder, tobacco use disorder, housing instability, DM2 + neuropathy, HTN, HLD, no inpatient psych admission, no suicide attempts, who initially presented to [facility]  (01/20/2022) for passive SI and assistance with rehab for crack cocaine use.  Patient was then IVC by EDP, which was rescinded on 01/21/2022 by Mayra Neer, MD.  He was then admitted Voluntary to [facility] (01/21/2022) via EMS for assistance with residential placement.      Patient presented as lethargic, but able to attend meals and groups. This was likely due to uncontrolled CGBs, which were initially ~300s. His biggest concern was housing to stay away from cocaine. Barrier to residential placement has been his insulin-dependency.  Total duration of encounter: 4 days  Cocaine use d/o  Tobacco use d/o  Action stage. Encouraged cessation Comfort PRNs   Substance induced mood d/o Continued home Cymbalta 60 mg daily  DM2-insulin-dependent with neuropathy, IMPROVING A1c 10.4 (01/21/2022), from 9.7 earlier this year.  Goal CBGs <180. CBGs still intermittently >200, however are downtrending. No hypoglycemia Continued home metformin 1000 mg twice daily Continued home glipizide 10 mg with breakfast Continued home empagliflozin 10 mg with breakfast Continued home insulin Long-acting 24 units daily Sensitive sliding scale Continued Gabapentin 600 mg TID and Cymbalta per above for neuropathy Educated on self foot exam twice daily Working with RN in to practice insulin self-administration and SSI    Clinical Course as of 01/25/22 1208  Tue Jan 22, 2022  0850 TSH(!): 0.248 [JN]  0850 Hemoglobin A1C(!): 10.4 [JN]  Wed Jan 23, 2022  0758 T4,Free(Direct): 0.72 [JN]  Thu Jan 24, 2022  1420 Triiodothyronine,Free,Serum: 2.2 [JN]    Clinical Course User Index [JN] Merrily Brittle, DO  Labs at [facility]: UDS+cocaine. CBC showed plt 445, otherwise within normal limits CMP showed glucose of 360, otherwise unremarkable.  AST/ALT wnl. BAL <52, salicylate level <7   Considerations for follow-up: Recommend high risk screening every 6-12 months with HIV, RPR, hepatitis panel PCP for insulin dependent DM2 -  uncontrolled  DISPO: Tentative date: 11/20 or 11/21 Location: Likely [place] in [place] or [place]  Unable to go to residential rehab due to insulin dependency  Signed: Merrily Brittle, DO Psychiatry Resident, PGY-2 01/25/2022, 12:08 PM   [facility] [place], [place] [address] Dept: [phone] Dept Fax: [phone]

## 2022-01-25 NOTE — Progress Notes (Signed)
Received David Wyatt this AM asleep in his bed, he woke up on his own and received breakfast. CBG performed before his meals.He has been compliant with his medications and giving his own insulin.

## 2022-01-25 NOTE — ED Notes (Signed)
Pt took shower °

## 2022-01-25 NOTE — ED Notes (Signed)
Snacks given 

## 2022-01-25 NOTE — Progress Notes (Signed)
Iam ate dinner, received his medications and administered his own insulin. He continues to have a hearty appetite.

## 2022-01-25 NOTE — ED Notes (Signed)
In peers group meeting

## 2022-01-26 DIAGNOSIS — E785 Hyperlipidemia, unspecified: Secondary | ICD-10-CM | POA: Diagnosis not present

## 2022-01-26 DIAGNOSIS — Z720 Tobacco use: Secondary | ICD-10-CM | POA: Diagnosis not present

## 2022-01-26 DIAGNOSIS — F1410 Cocaine abuse, uncomplicated: Secondary | ICD-10-CM | POA: Diagnosis not present

## 2022-01-26 DIAGNOSIS — F1420 Cocaine dependence, uncomplicated: Secondary | ICD-10-CM | POA: Diagnosis not present

## 2022-01-26 DIAGNOSIS — I10 Essential (primary) hypertension: Secondary | ICD-10-CM | POA: Diagnosis not present

## 2022-01-26 LAB — GLUCOSE, CAPILLARY
Glucose-Capillary: 123 mg/dL — ABNORMAL HIGH (ref 70–99)
Glucose-Capillary: 176 mg/dL — ABNORMAL HIGH (ref 70–99)
Glucose-Capillary: 84 mg/dL (ref 70–99)
Glucose-Capillary: 177 mg/dL — ABNORMAL HIGH (ref 70–99)

## 2022-01-26 MED ORDER — FAMOTIDINE 10 MG PO TABS
10.0000 mg | ORAL_TABLET | Freq: Two times a day (BID) | ORAL | Status: DC
  Administered 2022-01-26 – 2022-01-31 (×11): 10 mg via ORAL
  Filled 2022-01-26 (×11): qty 1

## 2022-01-26 NOTE — ED Notes (Signed)
Patient in day room - denies SI, HI and AVH - will continue to monitor for safety

## 2022-01-26 NOTE — Progress Notes (Signed)
Patient is awake and making needs known.  He was educated earlier and given handouts regarding how to eat with diabetes.  Articles reviewed with him.  He demonstrates a desire to learn about diabetes and about diet.  He is struggling with feeling hungry and wanting sugar.  Education provided.  He is calm, cooperative and denies desire to use cocaine.  Patient has been adhering to diabetic diet with education provided and CBG's have normalized.  Denies avh shi or plan.  Will continue to monitor and provide safe supportive environment.

## 2022-01-26 NOTE — ED Notes (Signed)
Pt came to the nurses desk and stated, "I think my blood sugar is getting low" as he was shaking his hand. CBG checked, 84. Gave Pt PB&J sandwich, peanut butter and crackers, cheese stick and milk carton. Informed Pt that this should sustain him for the evening. Will inform oncoming shift.

## 2022-01-26 NOTE — ED Notes (Signed)
Patient woke up with mild irritability however he was able to improve his outlook with staff attention and support.  He is now in a good mood laughing and smiling with staff.  Patient expressed interest in learning about diabetes and diet.  RN printed some informational pamphlets for him and reviewed them with him.  Educational teaching went well with moderate intake by patient.  He was given flyers and encouraged to review on his own also.  No withdrawal or somatic complaint.  Denies avh shi or plan.  Will monitor and provide safe environment.

## 2022-01-26 NOTE — ED Provider Notes (Signed)
Behavioral Health Progress Note  Date and Time: 01/26/2022 10:35 AM Name: David Wyatt MRN:  [id number]  Subjective:  David Wyatt is a 61 y.o. male, with PMH cocaine use disorder, tobacco use disorder, housing instability, DM2 + neuropathy, HTN, HLD, no inpatient psych admission, no suicide attempts, who initially presented to [place] (01/20/2022) for passive SI and assistance with rehab for crack cocaine use.  Patient was then IVC by EDP, which was rescinded on 01/21/2022 by Mayra Neer, MD.  He was then admitted Voluntary to [facility] (01/21/2022) via EMS for assistance with residential placement.    On assessment today patient reports that he is "alright." Patient reports that he is struggling with an intense craving for sugar and endorses that he constantly feels hungry. Patient reports that he is doing better about not eating snacks to better control his blood glucose despite this. Patient reports that otherwise he is sleeping well with the Trazodone and is not having any adverse side effects from his medications. Patient reports that he is still not having cravings for cocaine and is adamant that he wants to get help to maintain sobriety.  Patient denies SI, HI, and AVH.  MD Therapy session (group): Patient attended and participated in group. Patient continues to display very linear, restricted thought process. Patient is also very preoccupied with food. Patient struggled to pay attention and required redirection from provider and other group members. Patient struggled with application of distress tolerance practices and coping skills, patient continues to be pessimistic in thought overall, but did continue to participate. Patient struggled with appearing to feel as though he was being accused of "not trying" hard enough to solve his problems, but group continued to support patient, and patient remained pleasant.   Diagnosis:  Final diagnoses:  Dermoid cyst of skin of back  Tobacco abuse  Diabetic  polyneuropathy associated with type 2 diabetes mellitus (HCC)  Cocaine use disorder, severe, dependence ([place])    Total Time spent with patient: 1 hour  Past Psychiatric History:  See H&P Past Medical History:  Past Medical History:  Diagnosis Date   Angioedema of lips 07/28/2012   left upper (07/29/2012)   Arthritis    hands and back   Chronic back pain    Cocaine abuse (HCC)    Diabetic neuropathy (HCC)    High cholesterol    Hypertension    Neuropathy    Type II diabetes mellitus ([place])     Past Surgical History:  Procedure Laterality Date   BACK SURGERY     CYSTOSCOPY W/ URETERAL STENT PLACEMENT Right 02/23/2021   Procedure: CYSTOSCOPY WITH RETROGRADE PYELOGRAM/URETERAL STENT PLACEMENT;  Surgeon: Janith Lima, MD;  Location: [facility];  Service: Urology;  Laterality: Right;   HERNIA REPAIR Right 04/01/2012   I & D EXTREMITY Left 12/13/2012   Procedure: IRRIGATION AND DEBRIDEMENT LEFT THUMB;  Surgeon: Tennis Must, MD;  Location: [place];  Service: Orthopedics;  Laterality: Left;   INCISION AND DRAINAGE OF WOUND     boil on back/notes 07/14/2008 (07/29/2012)   INGUINAL HERNIA REPAIR  04/01/2012   Procedure: HERNIA REPAIR INGUINAL ADULT;  Surgeon: Haywood Lasso, MD;  Location: [place];  Service: General;  Laterality: Right;   INGUINAL HERNIA REPAIR Left 09/06/2016   Procedure: OPEN REPAIR LEFT INGUINAL HERNIA;  Surgeon: Greer Pickerel, MD;  Location: [place];  Service: General;  Laterality: Left;   INSERTION OF MESH Left 09/06/2016   Procedure: INSERTION OF MESH;  Surgeon: Greer Pickerel, MD;  Location:  [place] [facility];  Service: General;  Laterality: Left;   TONSILLECTOMY     Family History:  Family History  Problem Relation Age of Onset   Diabetes Mother    Kidney disease Mother    Hyperlipidemia Mother    Hyperlipidemia Father    Diabetes Father    Family Psychiatric  History: See H&P Social History:  Social History   Substance and Sexual Activity  Alcohol Use No   Alcohol/week: 0.0  standard drinks of alcohol     Social History   Substance and Sexual Activity  Drug Use Not Currently   Types: "Crack" cocaine    Social History   Socioeconomic History   Marital status: Divorced    Spouse name: Not on file   Number of children: 3   Years of education: 12   Highest education level: Not on file  Occupational History    Comment: disabled  Tobacco Use   Smoking status: Every Day    Packs/day: 0.25    Years: 30.00    Total pack years: 7.50    Types: Cigarettes   Smokeless tobacco: Former    Quit date: 08/15/2015   Tobacco comments:    04/29/16 2  cigs daily, 10/27/17 sometimes < .25 PPD  Vaping Use   Vaping Use: Never used  Substance and Sexual Activity   Alcohol use: No    Alcohol/week: 0.0 standard drinks of alcohol   Drug use: Not Currently    Types: "Crack" cocaine   Sexual activity: Not on file  Other Topics Concern   Not on file  Social History Narrative   04/29/17   Lives in shelter   Caffeine- a lot of  tea, coffee   Social Determinants of Health   Financial Resource Strain: Not on file  Food Insecurity: Not on file  Transportation Needs: Not on file  Physical Activity: Not on file  Stress: Not on file  Social Connections: Not on file   SDOH:  SDOH Screenings   Depression (PHQ2-9): Low Risk  (01/22/2022)  Tobacco Use: High Risk (01/23/2022)   Additional Social History:                         Sleep: Good  Appetite:   Elevated, constantly talking and thinking about food  Current Medications:  Current Facility-Administered Medications  Medication Dose Route Frequency Provider Last Rate Last Admin   acetaminophen (TYLENOL) tablet 650 mg  650 mg Oral Q6H PRN Merrily Brittle, DO   650 mg at 01/25/22 1750   alum & mag hydroxide-simeth (MAALOX/MYLANTA) 200-200-20 MG/5ML suspension 30 mL  30 mL Oral Q4H PRN Merrily Brittle, DO       amLODipine (NORVASC) tablet 10 mg  10 mg Oral Daily Merrily Brittle, DO   10 mg at 01/26/22 0932    atorvastatin (LIPITOR) tablet 40 mg  40 mg Oral Daily Merrily Brittle, DO   40 mg at 01/26/22 0932   DULoxetine (CYMBALTA) DR capsule 60 mg  60 mg Oral Daily Merrily Brittle, DO   60 mg at 01/26/22 0932   empagliflozin (JARDIANCE) tablet 10 mg  10 mg Oral QAC breakfast Merrily Brittle, DO   10 mg at 01/26/22 0747   feeding supplement (GLUCERNA SHAKE) (GLUCERNA SHAKE) liquid 237 mL  237 mL Oral TID BM Merrily Brittle, DO   237 mL at 01/26/22 0932   gabapentin (NEURONTIN) capsule 600 mg  600 mg Oral TID Merrily Brittle, DO   600 mg at  01/26/22 0932   glipiZIDE (GLUCOTROL XL) 24 hr tablet 10 mg  10 mg Oral Q breakfast Merrily Brittle, DO   10 mg at 01/26/22 0747   insulin aspart (novoLOG) injection 0-6 Units  0-6 Units Subcutaneous TID WC Merrily Brittle, DO   1 Units at 01/25/22 1232   insulin aspart (novoLOG) injection 5 Units  5 Units Subcutaneous BID AC Merrily Brittle, DO   5 Units at 01/25/22 1716   insulin glargine-yfgn (SEMGLEE) injection 24 Units  24 Units Subcutaneous Daily Minda Ditto, RPH   24 Units at 01/26/22 4259   lubriderm seriously sensitive lotion 1 Application  1 Application Topical BID Merrily Brittle, DO   1 Application at 56/38/75 6433   magnesium hydroxide (MILK OF MAGNESIA) suspension 30 mL  30 mL Oral Daily PRN Merrily Brittle, DO       metFORMIN (GLUCOPHAGE) tablet 1,000 mg  1,000 mg Oral BID WC Merrily Brittle, DO   1,000 mg at 01/26/22 0747   nicotine (NICODERM CQ - dosed in mg/24 hours) patch 21 mg  21 mg Transdermal Q0600 Merrily Brittle, DO   21 mg at 01/26/22 2951   nicotine polacrilex (NICORETTE) gum 4 mg  4 mg Oral PRN Merrily Brittle, DO       tamsulosin [facility]) capsule 0.4 mg  0.4 mg Oral Daily Merrily Brittle, DO   0.4 mg at 01/26/22 0932   traZODone (DESYREL) tablet 50 mg  50 mg Oral QHS PRN Evette Georges, NP   50 mg at 01/25/22 2118   Current Outpatient Medications  Medication Sig Dispense Refill   Accu-Chek Softclix Lancets lancets Use up to 4 times daily as directed 100 each 0    amLODipine (NORVASC) 10 MG tablet Take 1 tablet (10 mg total) by mouth daily. 30 tablet 0   atorvastatin (LIPITOR) 40 MG tablet Take 1 tablet (40 mg total) by mouth daily. 30 tablet 0   Blood Glucose Monitoring Suppl (ACCU-CHEK GUIDE) w/Device KIT USE AS DIRECTED TO CHECK BLOOD SUGARS UP TO TWICE PER DAY 1 kit 0   DULoxetine (CYMBALTA) 60 MG capsule Take 1 capsule (60 mg total) by mouth daily. 30 capsule 0   empagliflozin (JARDIANCE) 10 MG TABS tablet Take 1 tablet (10 mg total) by mouth daily before breakfast. 30 tablet 0   gabapentin (NEURONTIN) 300 MG capsule Take 2 capsules (600 mg total) by mouth 3 (three) times daily. 180 capsule 0   glipiZIDE (GLUCOTROL XL) 10 MG 24 hr tablet Take 1 tablet (10 mg total) by mouth daily with breakfast. 30 tablet 0   glucose blood (ACCU-CHEK GUIDE) test strip Use up to 3 times daily as directed 100 each 3   Insulin Glargine Solostar (LANTUS) 100 UNIT/ML Solostar Pen Inject 24 Units into the skin daily. 15 mL 2   insulin lispro (HUMALOG) 100 UNIT/ML KwikPen Inject 5 Units into the skin 2 (two) times daily before lunch and supper. (Patient not taking: Reported on 01/20/2022) 6 mL 6   Insulin Pen Needle 31G X 8 MM MISC Use with insulin pen 100 each 1   metFORMIN (GLUCOPHAGE) 1000 MG tablet Take 1 tablet (1,000 mg total) by mouth 2 (two) times daily. 60 tablet 0   tamsulosin (FLOMAX) 0.4 MG CAPS capsule Take 1 capsule (0.4 mg total) by mouth daily. For renal calculi 30 capsule 0    Labs  Lab Results:  Admission on 01/21/2022  Component Date Value Ref Range Status   Hgb A1c MFr Bld 01/21/2022 10.4 (H)  4.8 - 5.6 % Final   Comment: (NOTE) Pre diabetes:          5.7%-6.4%  Diabetes:              >6.4%  Glycemic control for   <7.0% adults with diabetes    Mean Plasma Glucose 01/21/2022 251.78  mg/dL Final   Performed at [facility], [place] [address], [place], [place] [address]   TSH 01/21/2022 0.248 (L)  0.350 - 4.500 uIU/mL Final   Comment: Performed  by a 3rd Generation assay with a functional sensitivity of <=0.01 uIU/mL. Performed at [facility], [place] [address], [place], [place] [address]    Glucose-Capillary 01/21/2022 374 (H)  70 - 99 mg/dL Final   Glucose reference range applies only to samples taken after fasting for at least 8 hours.   Glucose-Capillary 01/21/2022 289 (H)  70 - 99 mg/dL Final   Glucose reference range applies only to samples taken after fasting for at least 8 hours.   Glucose-Capillary 01/22/2022 137 (H)  70 - 99 mg/dL Final   Glucose reference range applies only to samples taken after fasting for at least 8 hours.   Glucose-Capillary 01/22/2022 297 (H)  70 - 99 mg/dL Final   Glucose reference range applies only to samples taken after fasting for at least 8 hours.   T3, Free 01/23/2022 2.2  2.0 - 4.4 pg/mL Final   Comment: (NOTE) Performed At: [facility] [place], [place] [id number] Rush Farmer MD XL:[id number]    Glucose-Capillary 01/22/2022 94  70 - 99 mg/dL Final   Glucose reference range applies only to samples taken after fasting for at least 8 hours.   Free T4 01/23/2022 0.72  0.61 - 1.12 ng/dL Final   Comment: (NOTE) Biotin ingestion may interfere with free T4 tests. If the results are inconsistent with the TSH level, previous test results, or the clinical presentation, then consider biotin interference. If needed, order repeat testing after stopping biotin. Performed at [facility], [place] [address], [place], [place] [address]    TSH 01/23/2022 0.203 (L)  0.350 - 4.500 uIU/mL Final   Comment: Performed by a 3rd Generation assay with a functional sensitivity of <=0.01 uIU/mL. Performed at [facility], [place] [address], [place], [place] [address]    Glucose-Capillary 01/23/2022 132 (H)  70 - 99 mg/dL Final   Glucose reference range applies only to samples taken after fasting for at least 8 hours.   Glucose-Capillary 01/22/2022 59 (L)  70 - 99 mg/dL Final    Glucose reference range applies only to samples taken after fasting for at least 8 hours.   Glucose-Capillary 01/22/2022 177 (H)  70 - 99 mg/dL Final   Glucose reference range applies only to samples taken after fasting for at least 8 hours.   Glucose-Capillary 01/23/2022 284 (H)  70 - 99 mg/dL Final   Glucose reference range applies only to samples taken after fasting for at least 8 hours.   Glucose-Capillary 01/23/2022 188 (H)  70 - 99 mg/dL Final   Glucose reference range applies only to samples taken after fasting for at least 8 hours.   Glucose-Capillary 01/23/2022 294 (H)  70 - 99 mg/dL Final   Glucose reference range applies only to samples taken after fasting for at least 8 hours.   Glucose-Capillary 01/24/2022 180 (H)  70 - 99 mg/dL Final   Glucose reference range applies only to samples taken after fasting for  at least 8 hours.   Glucose-Capillary 01/24/2022 275 (H)  70 - 99 mg/dL Final   Glucose reference range applies only to samples taken after fasting for at least 8 hours.   Glucose-Capillary 01/24/2022 146 (H)  70 - 99 mg/dL Final   Glucose reference range applies only to samples taken after fasting for at least 8 hours.   Glucose-Capillary 01/24/2022 137 (H)  70 - 99 mg/dL Final   Glucose reference range applies only to samples taken after fasting for at least 8 hours.   Glucose-Capillary 01/25/2022 201 (H)  70 - 99 mg/dL Final   Glucose reference range applies only to samples taken after fasting for at least 8 hours.   Glucose-Capillary 01/25/2022 195 (H)  70 - 99 mg/dL Final   Glucose reference range applies only to samples taken after fasting for at least 8 hours.   Glucose-Capillary 01/25/2022 149 (H)  70 - 99 mg/dL Final   Glucose reference range applies only to samples taken after fasting for at least 8 hours.   Glucose-Capillary 01/25/2022 147 (H)  70 - 99 mg/dL Final   Glucose reference range applies only to samples taken after fasting for at least 8 hours.   Admission on 01/20/2022, Discharged on 01/21/2022  Component Date Value Ref Range Status   SARS Coronavirus 2 by RT PCR 01/20/2022 NEGATIVE  NEGATIVE Final   Comment: (NOTE) SARS-CoV-2 target nucleic acids are NOT DETECTED.  The SARS-CoV-2 RNA is generally detectable in upper respiratory specimens during the acute phase of infection. The lowest concentration of SARS-CoV-2 viral copies this assay can detect is 138 copies/mL. A negative result does not preclude SARS-Cov-2 infection and should not be used as the sole basis for treatment or other patient management decisions. A negative result may occur with  improper specimen collection/handling, submission of specimen other than nasopharyngeal swab, presence of viral mutation(s) within the areas targeted by this assay, and inadequate number of viral copies(<138 copies/mL). A negative result must be combined with clinical observations, patient history, and epidemiological information. The expected result is Negative.  Fact Sheet for Patients:  EntrepreneurPulse.com.au  Fact Sheet for Healthcare Providers:  IncredibleEmployment.be  This test is no                          t yet approved or cleared by the [place] FDA and  has been authorized for detection and/or diagnosis of SARS-CoV-2 by FDA under an Emergency Use Authorization (EUA). This EUA will remain  in effect (meaning this test can be used) for the duration of the COVID-19 declaration under Section 564(b)(1) of the Act, 21 U.S.C.section 360bbb-3(b)(1), unless the authorization is terminated  or revoked sooner.       Influenza A by PCR 01/20/2022 NEGATIVE  NEGATIVE Final   Influenza B by PCR 01/20/2022 NEGATIVE  NEGATIVE Final   Comment: (NOTE) The Xpert Xpress SARS-CoV-2/FLU/RSV plus assay is intended as an aid in the diagnosis of influenza from Nasopharyngeal swab specimens and should not be used as a sole basis for treatment.  Nasal washings and aspirates are unacceptable for Xpert Xpress SARS-CoV-2/FLU/RSV testing.  Fact Sheet for Patients: EntrepreneurPulse.com.au  Fact Sheet for Healthcare Providers: IncredibleEmployment.be  This test is not yet approved or cleared by the [place] FDA and has been authorized for detection and/or diagnosis of SARS-CoV-2 by FDA under an Emergency Use Authorization (EUA). This EUA will remain in effect (meaning this test can be used) for the duration  of the COVID-19 declaration under Section 564(b)(1) of the Act, 21 U.S.C. section 360bbb-3(b)(1), unless the authorization is terminated or revoked.  Performed at [facility], [place] [address], [place], [place] [address]    Sodium 01/20/2022 136  135 - 145 mmol/L Final   Potassium 01/20/2022 4.0  3.5 - 5.1 mmol/L Final   Chloride 01/20/2022 103  98 - 111 mmol/L Final   CO2 01/20/2022 24  22 - 32 mmol/L Final   Glucose, Bld 01/20/2022 360 (H)  70 - 99 mg/dL Final   Glucose reference range applies only to samples taken after fasting for at least 8 hours.   BUN 01/20/2022 12  8 - 23 mg/dL Final   Creatinine, Ser 01/20/2022 1.01  0.61 - 1.24 mg/dL Final   Calcium 01/20/2022 9.4  8.9 - 10.3 mg/dL Final   Total Protein 01/20/2022 7.3  6.5 - 8.1 g/dL Final   Albumin 01/20/2022 3.4 (L)  3.5 - 5.0 g/dL Final   AST 01/20/2022 14 (L)  15 - 41 U/L Final   ALT 01/20/2022 10  0 - 44 U/L Final   Alkaline Phosphatase 01/20/2022 101  38 - 126 U/L Final   Total Bilirubin 01/20/2022 0.3  0.3 - 1.2 mg/dL Final   GFR, Estimated 01/20/2022 >60  >60 mL/min Final   Comment: (NOTE) Calculated using the CKD-EPI Creatinine Equation (2021)    Anion gap 01/20/2022 9  5 - 15 Final   Performed at [facility] [address] [place], [place] [address]   Alcohol, Ethyl (B) 01/20/2022 <10  <10 mg/dL Final   Comment: (NOTE) Lowest detectable limit for serum alcohol is 10 mg/dL.  For medical  purposes only. Performed at [facility], [place] [address] [place], [place] [address]    Opiates 01/20/2022 NONE DETECTED  NONE DETECTED Final   Cocaine 01/20/2022 POSITIVE (A)  NONE DETECTED Final   Benzodiazepines 01/20/2022 NONE DETECTED  NONE DETECTED Final   Amphetamines 01/20/2022 NONE DETECTED  NONE DETECTED Final   Tetrahydrocannabinol 01/20/2022 NONE DETECTED  NONE DETECTED Final   Barbiturates 01/20/2022 NONE DETECTED  NONE DETECTED Final   Comment: (NOTE) DRUG SCREEN FOR MEDICAL PURPOSES ONLY.  IF CONFIRMATION IS NEEDED FOR ANY PURPOSE, NOTIFY LAB WITHIN 5 DAYS.  LOWEST DETECTABLE LIMITS FOR URINE DRUG SCREEN Drug Class                     Cutoff (ng/mL) Amphetamine and metabolites    1000 Barbiturate and metabolites    200 Benzodiazepine                 200 Opiates and metabolites        300 Cocaine and metabolites        300 THC                            50 Performed at [facility], [place] [address] [place], [place] [address]    WBC 01/20/2022 5.2  4.0 - 10.5 K/uL Final   RBC 01/20/2022 4.73  4.22 - 5.81 MIL/uL Final   Hemoglobin 01/20/2022 14.3  13.0 - 17.0 g/dL Final   HCT 01/20/2022 43.1  39.0 - 52.0 % Final   MCV 01/20/2022 91.1  80.0 - 100.0 fL Final   MCH 01/20/2022 30.2  26.0 - 34.0 pg Final   MCHC 01/20/2022 33.2  30.0 - 36.0 g/dL Final   RDW 01/20/2022  14.1  11.5 - 15.5 % Final   Platelets 01/20/2022 444 (H)  150 - 400 K/uL Final   nRBC 01/20/2022 0.0  0.0 - 0.2 % Final   Neutrophils Relative % 01/20/2022 61  % Final   Neutro Abs 01/20/2022 3.2  1.7 - 7.7 K/uL Final   Lymphocytes Relative 01/20/2022 29  % Final   Lymphs Abs 01/20/2022 1.5  0.7 - 4.0 K/uL Final   Monocytes Relative 01/20/2022 7  % Final   Monocytes Absolute 01/20/2022 0.4  0.1 - 1.0 K/uL Final   Eosinophils Relative 01/20/2022 2  % Final   Eosinophils Absolute 01/20/2022 0.1  0.0 - 0.5 K/uL Final   Basophils Relative 01/20/2022 1  % Final   Basophils Absolute 01/20/2022  0.0  0.0 - 0.1 K/uL Final   Immature Granulocytes 01/20/2022 0  % Final   Abs Immature Granulocytes 01/20/2022 0.01  0.00 - 0.07 K/uL Final   Performed at [facility], [place] [address], [place], [place] [address]   Salicylate Lvl 32/35/5732 <7.0 (L)  7.0 - 30.0 mg/dL Final   Performed at [facility] [address], [place], [place] [address]   Acetaminophen (Tylenol), Serum 01/20/2022 <10 (L)  10 - 30 ug/mL Final   Comment: (NOTE) Therapeutic concentrations vary significantly. A range of 10-30 ug/mL  may be an effective concentration for many patients. However, some  are best treated at concentrations outside of this range. Acetaminophen concentrations >150 ug/mL at 4 hours after ingestion  and >50 ug/mL at 12 hours after ingestion are often associated with  toxic reactions.  Performed at [facility], [place] [address], [place], [place] [address]    Glucose-Capillary 01/20/2022 344 (H)  70 - 99 mg/dL Final   Glucose reference range applies only to samples taken after fasting for at least 8 hours.   Comment 1 01/20/2022 Notify RN   Final   Comment 2 01/20/2022 Document in Chart   Final   Glucose-Capillary 01/20/2022 479 (H)  70 - 99 mg/dL Final   Glucose reference range applies only to samples taken after fasting for at least 8 hours.   Glucose-Capillary 01/21/2022 243 (H)  70 - 99 mg/dL Final   Glucose reference range applies only to samples taken after fasting for at least 8 hours.   Glucose-Capillary 01/21/2022 224 (H)  70 - 99 mg/dL Final   Glucose reference range applies only to samples taken after fasting for at least 8 hours.   Glucose-Capillary 01/21/2022 282 (H)  70 - 99 mg/dL Final   Glucose reference range applies only to samples taken after fasting for at least 8 hours.  Admission on 01/18/2022, Discharged on 01/19/2022  Component Date Value Ref Range Status   WBC 01/18/2022 5.3  4.0 - 10.5 K/uL Final   RBC 01/18/2022 4.47  4.22 - 5.81 MIL/uL Final    Hemoglobin 01/18/2022 13.7  13.0 - 17.0 g/dL Final   HCT 01/18/2022 41.0  39.0 - 52.0 % Final   MCV 01/18/2022 91.7  80.0 - 100.0 fL Final   MCH 01/18/2022 30.6  26.0 - 34.0 pg Final   MCHC 01/18/2022 33.4  30.0 - 36.0 g/dL Final   RDW 01/18/2022 14.2  11.5 - 15.5 % Final   Platelets 01/18/2022 445 (H)  150 - 400 K/uL Final   nRBC 01/18/2022 0.0  0.0 - 0.2 % Final   Performed at [facility], [place] [address] [place], [place] [address]   Sodium  01/18/2022 136  135 - 145 mmol/L Final   Potassium 01/18/2022 4.3  3.5 - 5.1 mmol/L Final   Chloride 01/18/2022 100  98 - 111 mmol/L Final   CO2 01/18/2022 22  22 - 32 mmol/L Final   Glucose, Bld 01/18/2022 499 (H)  70 - 99 mg/dL Final   Glucose reference range applies only to samples taken after fasting for at least 8 hours.   BUN 01/18/2022 9  8 - 23 mg/dL Final   Creatinine, Ser 01/18/2022 1.23  0.61 - 1.24 mg/dL Final   Calcium 01/18/2022 9.1  8.9 - 10.3 mg/dL Final   Total Protein 01/18/2022 6.6  6.5 - 8.1 g/dL Final   Albumin 01/18/2022 3.2 (L)  3.5 - 5.0 g/dL Final   AST 01/18/2022 18  15 - 41 U/L Final   ALT 01/18/2022 9  0 - 44 U/L Final   Alkaline Phosphatase 01/18/2022 95  38 - 126 U/L Final   Total Bilirubin 01/18/2022 0.3  0.3 - 1.2 mg/dL Final   GFR, Estimated 01/18/2022 >60  >60 mL/min Final   Comment: (NOTE) Calculated using the CKD-EPI Creatinine Equation (2021)    Anion gap 01/18/2022 14  5 - 15 Final   Performed at [facility] [address] [place], [place] [address]   Color, Urine 01/18/2022 YELLOW  YELLOW Final   APPearance 01/18/2022 CLEAR  CLEAR Final   Specific Gravity, Urine 01/18/2022 1.025  1.005 - 1.030 Final   pH 01/18/2022 6.0  5.0 - 8.0 Final   Glucose, UA 01/18/2022 >=500 (A)  NEGATIVE mg/dL Final   Hgb urine dipstick 01/18/2022 NEGATIVE  NEGATIVE Final   Bilirubin Urine 01/18/2022 NEGATIVE  NEGATIVE Final   Ketones, ur 01/18/2022 NEGATIVE  NEGATIVE mg/dL Final   Protein, ur 01/18/2022  NEGATIVE  NEGATIVE mg/dL Final   Nitrite 01/18/2022 NEGATIVE  NEGATIVE Final   Leukocytes,Ua 01/18/2022 SMALL (A)  NEGATIVE Final   RBC / HPF 01/18/2022 0-5  0 - 5 RBC/hpf Final   WBC, UA 01/18/2022 21-50  0 - 5 WBC/hpf Final   Bacteria, UA 01/18/2022 RARE (A)  NONE SEEN Final   Squamous Epithelial / LPF 01/18/2022 0-5  0 - 5 Final   Budding Yeast 01/18/2022 PRESENT   Final   Performed at [facility], [place] [address], [place], [place] [address]   Opiates 01/18/2022 NONE DETECTED  NONE DETECTED Final   Cocaine 01/18/2022 POSITIVE (A)  NONE DETECTED Final   Benzodiazepines 01/18/2022 NONE DETECTED  NONE DETECTED Final   Amphetamines 01/18/2022 NONE DETECTED  NONE DETECTED Final   Tetrahydrocannabinol 01/18/2022 NONE DETECTED  NONE DETECTED Final   Barbiturates 01/18/2022 NONE DETECTED  NONE DETECTED Final   Comment: (NOTE) DRUG SCREEN FOR MEDICAL PURPOSES ONLY.  IF CONFIRMATION IS NEEDED FOR ANY PURPOSE, NOTIFY LAB WITHIN 5 DAYS.  LOWEST DETECTABLE LIMITS FOR URINE DRUG SCREEN Drug Class                     Cutoff (ng/mL) Amphetamine and metabolites    1000 Barbiturate and metabolites    200 Benzodiazepine                 200 Opiates and metabolites        300 Cocaine and metabolites        300 THC  50 Performed at [facility], [place] [address] [place], [place] [address]    Glucose-Capillary 01/18/2022 483 (H)  70 - 99 mg/dL Final   Glucose reference range applies only to samples taken after fasting for at least 8 hours.   Glucose-Capillary 01/19/2022 586 (HH)  70 - 99 mg/dL Final   Glucose reference range applies only to samples taken after fasting for at least 8 hours.   Glucose-Capillary 01/19/2022 456 (H)  70 - 99 mg/dL Final   Glucose reference range applies only to samples taken after fasting for at least 8 hours.   Glucose-Capillary 01/19/2022 515 (HH)  70 - 99 mg/dL Final   Glucose reference range applies only to samples taken after  fasting for at least 8 hours.   Comment 1 01/19/2022 Notify RN   Final   Glucose-Capillary 01/19/2022 164 (H)  70 - 99 mg/dL Final   Glucose reference range applies only to samples taken after fasting for at least 8 hours.  Admission on 01/07/2022, Discharged on 01/07/2022  Component Date Value Ref Range Status   Glucose-Capillary 01/07/2022 267 (H)  70 - 99 mg/dL Final   Glucose reference range applies only to samples taken after fasting for at least 8 hours.  Admission on 01/02/2022, Discharged on 01/02/2022  Component Date Value Ref Range Status   WBC 01/02/2022 7.0  4.0 - 10.5 K/uL Final   RBC 01/02/2022 4.54  4.22 - 5.81 MIL/uL Final   Hemoglobin 01/02/2022 13.9  13.0 - 17.0 g/dL Final   HCT 01/02/2022 41.4  39.0 - 52.0 % Final   MCV 01/02/2022 91.2  80.0 - 100.0 fL Final   MCH 01/02/2022 30.6  26.0 - 34.0 pg Final   MCHC 01/02/2022 33.6  30.0 - 36.0 g/dL Final   RDW 01/02/2022 14.1  11.5 - 15.5 % Final   Platelets 01/02/2022 369  150 - 400 K/uL Final   nRBC 01/02/2022 0.0  0.0 - 0.2 % Final   Neutrophils Relative % 01/02/2022 59  % Final   Neutro Abs 01/02/2022 4.1  1.7 - 7.7 K/uL Final   Lymphocytes Relative 01/02/2022 28  % Final   Lymphs Abs 01/02/2022 1.9  0.7 - 4.0 K/uL Final   Monocytes Relative 01/02/2022 8  % Final   Monocytes Absolute 01/02/2022 0.5  0.1 - 1.0 K/uL Final   Eosinophils Relative 01/02/2022 4  % Final   Eosinophils Absolute 01/02/2022 0.3  0.0 - 0.5 K/uL Final   Basophils Relative 01/02/2022 1  % Final   Basophils Absolute 01/02/2022 0.1  0.0 - 0.1 K/uL Final   Immature Granulocytes 01/02/2022 0  % Final   Abs Immature Granulocytes 01/02/2022 0.02  0.00 - 0.07 K/uL Final   Performed at [facility], [place] [address], [place], [place] [address]   Sodium 01/02/2022 138  135 - 145 mmol/L Final   Potassium 01/02/2022 4.1  3.5 - 5.1 mmol/L Final   Chloride 01/02/2022 104  98 - 111 mmol/L Final   CO2 01/02/2022 25  22 - 32 mmol/L Final   Glucose, Bld  01/02/2022 216 (H)  70 - 99 mg/dL Final   Glucose reference range applies only to samples taken after fasting for at least 8 hours.   BUN 01/02/2022 11  8 - 23 mg/dL Final   Creatinine, Ser 01/02/2022 0.98  0.61 - 1.24 mg/dL Final   Calcium 01/02/2022 9.5  8.9 - 10.3 mg/dL Final   Total Protein 01/02/2022 7.4  6.5 - 8.1  g/dL Final   Albumin 01/02/2022 3.4 (L)  3.5 - 5.0 g/dL Final   AST 01/02/2022 12 (L)  15 - 41 U/L Final   ALT 01/02/2022 11  0 - 44 U/L Final   Alkaline Phosphatase 01/02/2022 90  38 - 126 U/L Final   Total Bilirubin 01/02/2022 0.9  0.3 - 1.2 mg/dL Final   GFR, Estimated 01/02/2022 >60  >60 mL/min Final   Comment: (NOTE) Calculated using the CKD-EPI Creatinine Equation (2021)    Anion gap 01/02/2022 9  5 - 15 Final   Performed at [facility], [place] [address] [place], [place] [address]  [facility] on 11/01/2021  Component Date Value Ref Range Status   POC Glucose 11/27/2021 301 (A)  70 - 99 mg/dl Final   1 hr after lunch, had not taken insulin  Admission on 10/13/2021, Discharged on 10/15/2021  Component Date Value Ref Range Status   Glucose-Capillary 10/13/2021 388 (H)  70 - 99 mg/dL Final   Glucose reference range applies only to samples taken after fasting for at least 8 hours.   SARS Coronavirus 2 by RT PCR 10/13/2021 NEGATIVE  NEGATIVE Final   Comment: (NOTE) SARS-CoV-2 target nucleic acids are NOT DETECTED.  The SARS-CoV-2 RNA is generally detectable in upper respiratory specimens during the acute phase of infection. The lowest concentration of SARS-CoV-2 viral copies this assay can detect is 138 copies/mL. A negative result does not preclude SARS-Cov-2 infection and should not be used as the sole basis for treatment or other patient management decisions. A negative result may occur with  improper specimen collection/handling, submission of specimen other than nasopharyngeal swab, presence of viral mutation(s) within the areas  targeted by this assay, and inadequate number of viral copies(<138 copies/mL). A negative result must be combined with clinical observations, patient history, and epidemiological information. The expected result is Negative.  Fact Sheet for Patients:  EntrepreneurPulse.com.au  Fact Sheet for Healthcare Providers:  IncredibleEmployment.be  This test is no                          t yet approved or cleared by the [place] FDA and  has been authorized for detection and/or diagnosis of SARS-CoV-2 by FDA under an Emergency Use Authorization (EUA). This EUA will remain  in effect (meaning this test can be used) for the duration of the COVID-19 declaration under Section 564(b)(1) of the Act, 21 U.S.C.section 360bbb-3(b)(1), unless the authorization is terminated  or revoked sooner.       Influenza A by PCR 10/13/2021 NEGATIVE  NEGATIVE Final   Influenza B by PCR 10/13/2021 NEGATIVE  NEGATIVE Final   Comment: (NOTE) The Xpert Xpress SARS-CoV-2/FLU/RSV plus assay is intended as an aid in the diagnosis of influenza from Nasopharyngeal swab specimens and should not be used as a sole basis for treatment. Nasal washings and aspirates are unacceptable for Xpert Xpress SARS-CoV-2/FLU/RSV testing.  Fact Sheet for Patients: EntrepreneurPulse.com.au  Fact Sheet for Healthcare Providers: IncredibleEmployment.be  This test is not yet approved or cleared by the [place] FDA and has been authorized for detection and/or diagnosis of SARS-CoV-2 by FDA under an Emergency Use Authorization (EUA). This EUA will remain in effect (meaning this test can be used) for the duration of the COVID-19 declaration under Section 564(b)(1) of the Act, 21 U.S.C. section 360bbb-3(b)(1), unless the authorization is terminated or revoked.  Performed at [facility], [place] [address] [place], [place]  [address]    WBC 10/13/2021  9.5  4.0 - 10.5 K/uL Final   RBC 10/13/2021 4.23  4.22 - 5.81 MIL/uL Final   Hemoglobin 10/13/2021 13.0  13.0 - 17.0 g/dL Final   HCT 10/13/2021 40.4  39.0 - 52.0 % Final   MCV 10/13/2021 95.5  80.0 - 100.0 fL Final   MCH 10/13/2021 30.7  26.0 - 34.0 pg Final   MCHC 10/13/2021 32.2  30.0 - 36.0 g/dL Final   RDW 10/13/2021 13.4  11.5 - 15.5 % Final   Platelets 10/13/2021 551 (H)  150 - 400 K/uL Final   nRBC 10/13/2021 0.0  0.0 - 0.2 % Final   Neutrophils Relative % 10/13/2021 68  % Final   Neutro Abs 10/13/2021 6.5  1.7 - 7.7 K/uL Final   Lymphocytes Relative 10/13/2021 18  % Final   Lymphs Abs 10/13/2021 1.7  0.7 - 4.0 K/uL Final   Monocytes Relative 10/13/2021 9  % Final   Monocytes Absolute 10/13/2021 0.8  0.1 - 1.0 K/uL Final   Eosinophils Relative 10/13/2021 3  % Final   Eosinophils Absolute 10/13/2021 0.3  0.0 - 0.5 K/uL Final   Basophils Relative 10/13/2021 1  % Final   Basophils Absolute 10/13/2021 0.1  0.0 - 0.1 K/uL Final   Immature Granulocytes 10/13/2021 1  % Final   Abs Immature Granulocytes 10/13/2021 0.11 (H)  0.00 - 0.07 K/uL Final   Performed at [facility], [place] [address], [place], [place] [address]   Sodium 10/13/2021 136  135 - 145 mmol/L Final   Potassium 10/13/2021 4.3  3.5 - 5.1 mmol/L Final   Chloride 10/13/2021 104  98 - 111 mmol/L Final   CO2 10/13/2021 24  22 - 32 mmol/L Final   Glucose, Bld 10/13/2021 296 (H)  70 - 99 mg/dL Final   Glucose reference range applies only to samples taken after fasting for at least 8 hours.   BUN 10/13/2021 14  8 - 23 mg/dL Final   Creatinine, Ser 10/13/2021 1.15  0.61 - 1.24 mg/dL Final   Calcium 10/13/2021 9.0  8.9 - 10.3 mg/dL Final   Total Protein 10/13/2021 7.5  6.5 - 8.1 g/dL Final   Albumin 10/13/2021 3.0 (L)  3.5 - 5.0 g/dL Final   AST 10/13/2021 13 (L)  15 - 41 U/L Final   ALT 10/13/2021 10  0 - 44 U/L Final   Alkaline Phosphatase 10/13/2021 84  38 - 126 U/L Final   Total Bilirubin 10/13/2021 0.5  0.3 - 1.2  mg/dL Final   GFR, Estimated 10/13/2021 >60  >60 mL/min Final   Comment: (NOTE) Calculated using the CKD-EPI Creatinine Equation (2021)    Anion gap 10/13/2021 8  5 - 15 Final   Performed at [facility], [place] [address] [place], [place] [address]   Color, Urine 10/13/2021 YELLOW  YELLOW Final   APPearance 10/13/2021 CLOUDY (A)  CLEAR Final   Specific Gravity, Urine 10/13/2021 1.022  1.005 - 1.030 Final   pH 10/13/2021 5.0  5.0 - 8.0 Final   Glucose, UA 10/13/2021 >=500 (A)  NEGATIVE mg/dL Final   Hgb urine dipstick 10/13/2021 LARGE (A)  NEGATIVE Final   Bilirubin Urine 10/13/2021 NEGATIVE  NEGATIVE Final   Ketones, ur 10/13/2021 NEGATIVE  NEGATIVE mg/dL Final   Protein, ur 10/13/2021 30 (A)  NEGATIVE mg/dL Final   Nitrite 10/13/2021 NEGATIVE  NEGATIVE Final   Leukocytes,Ua 10/13/2021 LARGE (A)  NEGATIVE Final   RBC / HPF  10/13/2021 >50 (H)  0 - 5 RBC/hpf Final   WBC, UA 10/13/2021 >50 (H)  0 - 5 WBC/hpf Final   Bacteria, UA 10/13/2021 RARE (A)  NONE SEEN Final   Squamous Epithelial / LPF 10/13/2021 0-5  0 - 5 Final   WBC Clumps 10/13/2021 PRESENT   Final   Mucus 10/13/2021 PRESENT   Final   Budding Yeast 10/13/2021 PRESENT   Final   Non Squamous Epithelial 10/13/2021 0-5 (A)  NONE SEEN Final   Performed at [facility], [place] [address], [place], [place] [address]   pH, Ven 10/13/2021 7.457 (H)  7.25 - 7.43 Final   pCO2, Ven 10/13/2021 38.1 (L)  44 - 60 mmHg Final   pO2, Ven 10/13/2021 174 (H)  32 - 45 mmHg Final   Bicarbonate 10/13/2021 26.9  20.0 - 28.0 mmol/L Final   TCO2 10/13/2021 28  22 - 32 mmol/L Final   O2 Saturation 10/13/2021 100  % Final   Acid-Base Excess 10/13/2021 3.0 (H)  0.0 - 2.0 mmol/L Final   Sodium 10/13/2021 137  135 - 145 mmol/L Final   Potassium 10/13/2021 4.3  3.5 - 5.1 mmol/L Final   Calcium, Ion 10/13/2021 1.10 (L)  1.15 - 1.40 mmol/L Final   HCT 10/13/2021 40.0  39.0 - 52.0 % Final   Hemoglobin 10/13/2021 13.6  13.0 - 17.0 g/dL Final    Sample type 10/13/2021 VENOUS   Final   Opiates 10/13/2021 NONE DETECTED  NONE DETECTED Final   Cocaine 10/13/2021 POSITIVE (A)  NONE DETECTED Final   Benzodiazepines 10/13/2021 NONE DETECTED  NONE DETECTED Final   Amphetamines 10/13/2021 NONE DETECTED  NONE DETECTED Final   Tetrahydrocannabinol 10/13/2021 NONE DETECTED  NONE DETECTED Final   Barbiturates 10/13/2021 NONE DETECTED  NONE DETECTED Final   Comment: (NOTE) DRUG SCREEN FOR MEDICAL PURPOSES ONLY.  IF CONFIRMATION IS NEEDED FOR ANY PURPOSE, NOTIFY LAB WITHIN 5 DAYS.  LOWEST DETECTABLE LIMITS FOR URINE DRUG SCREEN Drug Class                     Cutoff (ng/mL) Amphetamine and metabolites    1000 Barbiturate and metabolites    200 Benzodiazepine                 254 Tricyclics and metabolites     300 Opiates and metabolites        300 Cocaine and metabolites        300 THC                            50 Performed at [facility], [place] [address] [place], [place] [address]    Adenovirus 10/13/2021 NOT DETECTED  NOT DETECTED Final   Coronavirus 229E 10/13/2021 NOT DETECTED  NOT DETECTED Final   Comment: (NOTE) The Coronavirus on the Respiratory Panel, DOES NOT test for the novel  Coronavirus (2019 nCoV)    Coronavirus HKU1 10/13/2021 NOT DETECTED  NOT DETECTED Final   Coronavirus NL63 10/13/2021 NOT DETECTED  NOT DETECTED Final   Coronavirus OC43 10/13/2021 NOT DETECTED  NOT DETECTED Final   Metapneumovirus 10/13/2021 NOT DETECTED  NOT DETECTED Final   Rhinovirus / Enterovirus 10/13/2021 NOT DETECTED  NOT DETECTED Final   Influenza A 10/13/2021 NOT DETECTED  NOT DETECTED Final   Influenza B 10/13/2021 NOT DETECTED  NOT DETECTED Final   Parainfluenza Virus 1 10/13/2021 NOT DETECTED  NOT DETECTED Final  Parainfluenza Virus 2 10/13/2021 NOT DETECTED  NOT DETECTED Final   Parainfluenza Virus 3 10/13/2021 NOT DETECTED  NOT DETECTED Final   Parainfluenza Virus 4 10/13/2021 NOT DETECTED  NOT DETECTED Final   Respiratory  Syncytial Virus 10/13/2021 NOT DETECTED  NOT DETECTED Final   Bordetella pertussis 10/13/2021 NOT DETECTED  NOT DETECTED Final   Bordetella Parapertussis 10/13/2021 NOT DETECTED  NOT DETECTED Final   Chlamydophila pneumoniae 10/13/2021 NOT DETECTED  NOT DETECTED Final   Mycoplasma pneumoniae 10/13/2021 NOT DETECTED  NOT DETECTED Final   Performed at [facility], [place] [address], [place], [place] [address]   Procalcitonin 10/13/2021 <0.10  ng/mL Final   Comment:        Interpretation: PCT (Procalcitonin) <= 0.5 ng/mL: Systemic infection (sepsis) is not likely. Local bacterial infection is possible. (NOTE)       Sepsis PCT Algorithm           Lower Respiratory Tract                                      Infection PCT Algorithm    ----------------------------     ----------------------------         PCT < 0.25 ng/mL                PCT < 0.10 ng/mL          Strongly encourage             Strongly discourage   discontinuation of antibiotics    initiation of antibiotics    ----------------------------     -----------------------------       PCT 0.25 - 0.50 ng/mL            PCT 0.10 - 0.25 ng/mL               OR       >80% decrease in PCT            Discourage initiation of                                            antibiotics      Encourage discontinuation           of antibiotics    ----------------------------     -----------------------------         PCT >= 0.50 ng/mL              PCT 0.26 - 0.50 ng/mL               AND                                 <80% decrease in PCT             Encourage initiation of                                             antibiotics       Encourage continuation           of antibiotics    ----------------------------     -----------------------------  PCT >= 0.50 ng/mL                  PCT > 0.50 ng/mL               AND         increase in PCT                  Strongly encourage                                      initiation of  antibiotics    Strongly encourage escalation           of antibiotics                                     -----------------------------                                           PCT <= 0.25 ng/mL                                                 OR                                        > 80% decrease in PCT                                      Discontinue / Do not initiate                                             antibiotics  Performed at [facility], [address], [place], [place] [address]    B Natriuretic Peptide 10/13/2021 23.6  0.0 - 100.0 pg/mL Final   Performed at [place] [address], [place], [place] [address]   L. pneumophila Serogp 1 Ur Ag 10/13/2021 Negative  Negative Final   Comment: (NOTE) Presumptive negative for L. pneumophila serogroup 1 antigen in urine, suggesting no recent or current infection. Legionnaires' disease cannot be ruled out since other serogroups and species may also cause disease. Performed At: [facility] [place], [place] [id number] Rush Farmer MD EX:[id number]    Source of Sample 10/13/2021 URINE, RANDOM   Final   Performed at [facility], [place] [address], [place], [place] [address]   Strep Pneumo Urinary Antigen 10/13/2021 NEGATIVE  NEGATIVE Final   Comment:        Infection due to S. pneumoniae cannot be absolutely ruled out since the antigen present may be below the detection limit of the test. Performed at [facility], [address] [place], [place] [address]    Sodium 10/14/2021 138  135 - 145 mmol/L Final   Potassium 10/14/2021 4.3  3.5 - 5.1 mmol/L Final   Chloride 10/14/2021 106  98 - 111 mmol/L Final   CO2 10/14/2021 25  22 - 32 mmol/L Final   Glucose, Bld 10/14/2021 286 (H)  70 - 99 mg/dL Final   Glucose reference range applies only to samples taken after fasting for at least 8 hours.   BUN 10/14/2021 14  8 - 23 mg/dL Final   Creatinine, Ser 10/14/2021 0.98  0.61  - 1.24 mg/dL Final   Calcium 10/14/2021 8.9  8.9 - 10.3 mg/dL Final   GFR, Estimated 10/14/2021 >60  >60 mL/min Final   Comment: (NOTE) Calculated using the CKD-EPI Creatinine Equation (2021)    Anion gap 10/14/2021 7  5 - 15 Final   Performed at [facility], [place] [address], [place], [place] [address]   WBC 10/14/2021 9.6  4.0 - 10.5 K/uL Final   RBC 10/14/2021 4.12 (L)  4.22 - 5.81 MIL/uL Final   Hemoglobin 10/14/2021 12.6 (L)  13.0 - 17.0 g/dL Final   HCT 10/14/2021 38.8 (L)  39.0 - 52.0 % Final   MCV 10/14/2021 94.2  80.0 - 100.0 fL Final   MCH 10/14/2021 30.6  26.0 - 34.0 pg Final   MCHC 10/14/2021 32.5  30.0 - 36.0 g/dL Final   RDW 10/14/2021 13.4  11.5 - 15.5 % Final   Platelets 10/14/2021 556 (H)  150 - 400 K/uL Final   nRBC 10/14/2021 0.0  0.0 - 0.2 % Final   Performed at [facility], [place] [address] [place], [place] [address]   Glucose-Capillary 10/13/2021 281 (H)  70 - 99 mg/dL Final   Glucose reference range applies only to samples taken after fasting for at least 8 hours.   Glucose-Capillary 10/13/2021 399 (H)  70 - 99 mg/dL Final   Glucose reference range applies only to samples taken after fasting for at least 8 hours.   Glucose-Capillary 10/14/2021 239 (H)  70 - 99 mg/dL Final   Glucose reference range applies only to samples taken after fasting for at least 8 hours.   Glucose-Capillary 10/14/2021 345 (H)  70 - 99 mg/dL Final   Glucose reference range applies only to samples taken after fasting for at least 8 hours.   Glucose-Capillary 10/14/2021 383 (H)  70 - 99 mg/dL Final   Glucose reference range applies only to samples taken after fasting for at least 8 hours.   Glucose-Capillary 10/15/2021 194 (H)  70 - 99 mg/dL Final   Glucose reference range applies only to samples taken after fasting for at least 8 hours.   Glucose-Capillary 10/15/2021 161 (H)  70 - 99 mg/dL Final   Glucose reference range applies only to samples taken after fasting for at least 8  hours.   Glucose-Capillary 10/15/2021 312 (H)  70 - 99 mg/dL Final   Glucose reference range applies only to samples taken after fasting for at least 8 hours.   Glucose-Capillary 10/14/2021 279 (H)  70 - 99 mg/dL Final   Glucose reference range applies only to samples taken after fasting for at least 8 hours.  Office Visit on 10/08/2021  Component Date Value Ref Range Status   POC Glucose 10/08/2021 321 (A)  70 - 99 mg/dl Final   HbA1c, POC (controlled diabetic ra* 10/08/2021 9.3 (A)  0.0 - 7.0 % Final  Admission on 10/07/2021, Discharged on 10/07/2021  Component Date Value Ref Range Status   Color, Urine 10/07/2021 YELLOW  YELLOW Final   APPearance 10/07/2021 HAZY (  A)  CLEAR Final   Specific Gravity, Urine 10/07/2021 1.010  1.005 - 1.030 Final   pH 10/07/2021 5.0  5.0 - 8.0 Final   Glucose, UA 10/07/2021 >=500 (A)  NEGATIVE mg/dL Final   Hgb urine dipstick 10/07/2021 MODERATE (A)  NEGATIVE Final   Bilirubin Urine 10/07/2021 NEGATIVE  NEGATIVE Final   Ketones, ur 10/07/2021 NEGATIVE  NEGATIVE mg/dL Final   Protein, ur 10/07/2021 30 (A)  NEGATIVE mg/dL Final   Nitrite 10/07/2021 NEGATIVE  NEGATIVE Final   Leukocytes,Ua 10/07/2021 LARGE (A)  NEGATIVE Final   RBC / HPF 10/07/2021 21-50  0 - 5 RBC/hpf Final   WBC, UA 10/07/2021 >50 (H)  0 - 5 WBC/hpf Final   Bacteria, UA 10/07/2021 NONE SEEN  NONE SEEN Final   WBC Clumps 10/07/2021 PRESENT   Final   Mucus 10/07/2021 PRESENT   Final   Budding Yeast 10/07/2021 PRESENT   Final   Performed at [facility], [place] [address], [place], [place] [address]   WBC 10/07/2021 8.9  4.0 - 10.5 K/uL Final   RBC 10/07/2021 4.35  4.22 - 5.81 MIL/uL Final   Hemoglobin 10/07/2021 13.7  13.0 - 17.0 g/dL Final   HCT 10/07/2021 41.9  39.0 - 52.0 % Final   MCV 10/07/2021 96.3  80.0 - 100.0 fL Final   MCH 10/07/2021 31.5  26.0 - 34.0 pg Final   MCHC 10/07/2021 32.7  30.0 - 36.0 g/dL Final   RDW 10/07/2021 13.4  11.5 - 15.5 % Final    Platelets 10/07/2021 376  150 - 400 K/uL Final   nRBC 10/07/2021 0.0  0.0 - 0.2 % Final   Neutrophils Relative % 10/07/2021 59  % Final   Neutro Abs 10/07/2021 5.2  1.7 - 7.7 K/uL Final   Lymphocytes Relative 10/07/2021 17  % Final   Lymphs Abs 10/07/2021 1.5  0.7 - 4.0 K/uL Final   Monocytes Relative 10/07/2021 19  % Final   Monocytes Absolute 10/07/2021 1.7 (H)  0.1 - 1.0 K/uL Final   Eosinophils Relative 10/07/2021 5  % Final   Eosinophils Absolute 10/07/2021 0.5  0.0 - 0.5 K/uL Final   Basophils Relative 10/07/2021 0  % Final   Basophils Absolute 10/07/2021 0.0  0.0 - 0.1 K/uL Final   Immature Granulocytes 10/07/2021 0  % Final   Abs Immature Granulocytes 10/07/2021 0.04  0.00 - 0.07 K/uL Final   Performed at [facility], [place] [address], [place], [place] [address]   Sodium 10/07/2021 141  135 - 145 mmol/L Final   Potassium 10/07/2021 4.0  3.5 - 5.1 mmol/L Final   Chloride 10/07/2021 107  98 - 111 mmol/L Final   CO2 10/07/2021 24  22 - 32 mmol/L Final   Glucose, Bld 10/07/2021 81  70 - 99 mg/dL Final   Glucose reference range applies only to samples taken after fasting for at least 8 hours.   BUN 10/07/2021 14  8 - 23 mg/dL Final   Creatinine, Ser 10/07/2021 1.19  0.61 - 1.24 mg/dL Final   Calcium 10/07/2021 9.4  8.9 - 10.3 mg/dL Final   Total Protein 10/07/2021 7.9  6.5 - 8.1 g/dL Final   Albumin 10/07/2021 3.5  3.5 - 5.0 g/dL Final   AST 10/07/2021 12 (L)  15 - 41 U/L Final   ALT 10/07/2021 10  0 - 44 U/L Final   Alkaline Phosphatase 10/07/2021 73  38 - 126 U/L Final   Total Bilirubin 10/07/2021 0.7  0.3 - 1.2 mg/dL Final   GFR, Estimated 10/07/2021 >60  >60 mL/min Final   Comment: (NOTE) Calculated using the CKD-EPI Creatinine Equation (2021)    Anion gap 10/07/2021 10  5 - 15 Final   Performed at [facility], [place] [address], [place], [place] [address]   Specimen Description 10/07/2021    Final                   Value:URINE, CLEAN  CATCH Performed at [facility], [place] [address], [place], [place] [address]    Special Requests 10/07/2021    Final                   Value:NONE Performed at [facility], [place] [address], [place], [place] [address]    Culture 10/07/2021  (A)   Final                   Value:40,000 COLONIES/mL YEAST 20,000 COLONIES/mL LACTOBACILLUS SPECIES Standardized susceptibility testing for this organism is not available. Performed at [facility], [place] [address] [place], [place] [address]    Report Status 10/07/2021 10/08/2021 FINAL   Final  Admission on 10/04/2021, Discharged on 10/04/2021  Component Date Value Ref Range Status   WBC 10/04/2021 9.3  4.0 - 10.5 K/uL Final   RBC 10/04/2021 4.26  4.22 - 5.81 MIL/uL Final   Hemoglobin 10/04/2021 13.4  13.0 - 17.0 g/dL Final   HCT 10/04/2021 41.5  39.0 - 52.0 % Final   MCV 10/04/2021 97.4  80.0 - 100.0 fL Final   MCH 10/04/2021 31.5  26.0 - 34.0 pg Final   MCHC 10/04/2021 32.3  30.0 - 36.0 g/dL Final   RDW 10/04/2021 13.6  11.5 - 15.5 % Final   Platelets 10/04/2021 367  150 - 400 K/uL Final   nRBC 10/04/2021 0.0  0.0 - 0.2 % Final   Performed at [facility], [place] [address], [place], [place] [address]   Sodium 10/04/2021 138  135 - 145 mmol/L Final   Potassium 10/04/2021 4.4  3.5 - 5.1 mmol/L Final   Chloride 10/04/2021 107  98 - 111 mmol/L Final   CO2 10/04/2021 22  22 - 32 mmol/L Final   Glucose, Bld 10/04/2021 236 (H)  70 - 99 mg/dL Final   Glucose reference range applies only to samples taken after fasting for at least 8 hours.   BUN 10/04/2021 13  8 - 23 mg/dL Final   Creatinine, Ser 10/04/2021 1.15  0.61 - 1.24 mg/dL Final   Calcium 10/04/2021 8.9  8.9 - 10.3 mg/dL Final   GFR, Estimated 10/04/2021 >60  >60 mL/min Final   Comment: (NOTE) Calculated using the CKD-EPI Creatinine Equation (2021)    Anion gap 10/04/2021 9  5 - 15 Final   Performed at [facility], [place] [address] [place], [place] [address]  There may be more visits with results that are not included.    Blood Alcohol level:  Lab Results  Component Value Date   ETH <10 01/20/2022   ETH <10 81/85/6314    Metabolic Disorder Labs: Lab Results  Component Value Date   HGBA1C 10.4 (H) 01/21/2022   MPG 251.78 01/21/2022   MPG >398 02/24/2021   No results found for: "PROLACTIN" Lab Results  Component Value Date   CHOL 213 (H) 05/23/2021   TRIG 115 05/23/2021   HDL 71 05/23/2021   CHOLHDL 3.2 11/03/2019  VLDL 32 (H) 05/03/2015   LDLCALC 122 (H) 05/23/2021   [place] 99 11/03/2019    Therapeutic Lab Levels: No results found for: "LITHIUM" No results found for: "VALPROATE" No results found for: "CBMZ"  Physical Findings   GAD-7    [place] Office Visit from 10/08/2021 in [place] Office Visit from 09/05/2021 in [place] [facility] Office Visit from 12/26/2020 in [place] Office Visit from 11/17/2018 in [place] Office Visit from 06/05/2017 in [place]  Total GAD-7 Score _0 0 12      PHQ2-9    [place] ED from 01/21/2022 in [facility] Office Visit from 10/08/2021 in [place] Office Visit from 09/05/2021 in [place] 1 ED from 07/02/2021 in [facility] Office Visit from 12/26/2020 in [place]  PHQ-2 Total Score _1 PHQ-9 Total Score -- _2 Flowsheet Row ED from 01/21/2022 in [facility] ED from 01/20/2022 in [place] ED from 01/18/2022 in [place] CATEGORY Low Risk Low Risk No Risk        Musculoskeletal  Strength & Muscle Tone: within normal  limits Gait & Station: normal Patient leans: N/A  Psychiatric Specialty Exam  Presentation  General Appearance:  Appropriate for Environment  Eye Contact: Good  Speech: Clear and Coherent  Speech Volume: Normal  Handedness: Right   Mood and Affect  Mood: Euthymic  Affect: Appropriate   Thought Process  Thought Processes: Linear (mostly thinking about food, but is redirectable)  Descriptions of Associations:Intact  Orientation:Full (Time, Place and Person)  Thought Content:Logical  Diagnosis of Schizophrenia or Schizoaffective disorder in past: No    Hallucinations:Hallucinations: None  Ideas of Reference:None  Suicidal Thoughts:Suicidal Thoughts: No  Homicidal Thoughts:Homicidal Thoughts: No   Sensorium  Memory: Immediate Fair; Recent Fair  Judgment: -- (Improving)  Insight: Good   Executive Functions  Concentration: Fair  Attention Span: Good  Recall: Roel Cluck of Knowledge: Fair  Language: Fair   Psychomotor Activity  Psychomotor Activity:Psychomotor Activity: Decreased   Assets  Assets: Armed forces logistics/support/administrative officer; Resilience; Desire for Improvement; Financial Resources/Insurance   Sleep  Sleep:Sleep: Good   No data recorded  Physical Exam  Physical Exam HENT:     Head: Normocephalic and atraumatic.  Pulmonary:     Effort: Pulmonary effort is normal.  Neurological:     Mental Status: He is alert and oriented to person, place, and time.    Review of Systems  Psychiatric/Behavioral:  Negative for depression and suicidal ideas. The patient does not have insomnia.    Blood pressure (!) 101/58, pulse 67, temperature 98.3 F (36.8 C), temperature source Tympanic, resp. rate 18, SpO2 97 %. There is no height or weight on file to calculate BMI.  Treatment Plan Summary: Daily contact with patient to assess and evaluate symptoms and progress in treatment and Medication management   ON assessment today patient appears  to be in the preparation stage for his stimulant use disorder. Patient displayed very good insight into recognizing that environment is is his trigger, and that he needs to use the resources available to help change his environment. Patient also made comments about how living on the streets  negatively impacts his mood and again leads to cocaine use to remedy the low mood.   Patient's constant hunger may indicate patient needs a higher protein diet; however it could also be a sign that patient may have some gastritis due to his known hx of GERD. Will start Pepcid in hopes to target this possible cause and will also seek a higher protein diet for patient while in [facility]. Patient also appears to be educating himself on a T2DM diet for when he is discharged.   Cocaine use d/o  Tobacco use d/o  Action stage. Encouraged cessation Comfort PRNs    Substance induced mood d/o Continued home Cymbalta 60 mg daily   DM2-insulin-dependent with neuropathy, IMPROVING A1c 10.4 (01/21/2022), from 9.7 earlier this year. Goal CBGs <180. CBGs still intermittently >200, however are downtrending. No hypoglycemia Continued home metformin 1000 mg twice daily Continued home glipizide 10 mg with breakfast Continued home empagliflozin 10 mg with breakfast Continued home insulin Long-acting 24 units daily Sensitive sliding scale Continued Gabapentin 600 mg TID and Cymbalta per above for neuropathy Educated on self foot exam twice daily Continue insulin self-administration and SSI    Increased appetite Hx of GERD - Start pepcid 52m BID  Considerations for follow-up: Recommend high risk screening every 6-12 months with HIV, RPR, hepatitis panel PCP for insulin dependent DM2 - uncontrolled   DISPO: Tentative date: 11/20 or 11/21 [place] [place] Unable to go to residential rehab due to insulin dependency           PGY-3 JFreida Busman MD 01/26/2022 10:35  AM

## 2022-01-26 NOTE — ED Notes (Signed)
Patient observed/assessed in room in bed appearing in no immediate distress resting peacefully. Q15 minute checks continued by MHT and nursing staff. Will continue to monitor and support. 

## 2022-01-26 NOTE — ED Notes (Signed)
Patient resting with no sxs of distress noted - will continue to monitor for safety

## 2022-01-27 DIAGNOSIS — I10 Essential (primary) hypertension: Secondary | ICD-10-CM | POA: Diagnosis not present

## 2022-01-27 DIAGNOSIS — F1410 Cocaine abuse, uncomplicated: Secondary | ICD-10-CM | POA: Diagnosis not present

## 2022-01-27 DIAGNOSIS — F1420 Cocaine dependence, uncomplicated: Secondary | ICD-10-CM | POA: Diagnosis not present

## 2022-01-27 DIAGNOSIS — Z720 Tobacco use: Secondary | ICD-10-CM | POA: Diagnosis not present

## 2022-01-27 DIAGNOSIS — E785 Hyperlipidemia, unspecified: Secondary | ICD-10-CM | POA: Diagnosis not present

## 2022-01-27 LAB — GLUCOSE, CAPILLARY
Glucose-Capillary: 150 mg/dL — ABNORMAL HIGH (ref 70–99)
Glucose-Capillary: 171 mg/dL — ABNORMAL HIGH (ref 70–99)
Glucose-Capillary: 159 mg/dL — ABNORMAL HIGH (ref 70–99)
Glucose-Capillary: 67 mg/dL — ABNORMAL LOW (ref 70–99)
Glucose-Capillary: 107 mg/dL — ABNORMAL HIGH (ref 70–99)
Glucose-Capillary: 144 mg/dL — ABNORMAL HIGH (ref 70–99)

## 2022-01-27 NOTE — ED Notes (Signed)
Pt resting in room in no acute distress. RR even and unlabored. Denies concerns at present. Informed pt to notify staff with any needs or concerns. Environment secured. Will continue to monitor for safety.

## 2022-01-27 NOTE — ED Provider Notes (Signed)
Behavioral Health Progress Note  Date and Time: 01/27/2022 11:42 AM Name: David Wyatt MRN:  [id number]  Subjective:   David Wyatt is a 61 y.o. male, with PMH cocaine use disorder, tobacco use disorder, housing instability, DM2 + neuropathy, HTN, HLD, no inpatient psych admission, no suicide attempts, who initially presented to [facility] (01/20/2022) for passive SI and assistance with rehab for crack cocaine use.  Patient was then IVC by EDP, which was rescinded on 01/21/2022 by Mayra Neer, MD.  He was then admitted Voluntary to [facility] (01/21/2022) via EMS for assistance with residential placement.     On assessment today, patient reports that he is in a lot more physical pain. Patient endorses that his lower back is bothering him and he is having sciatica. Patient denies SI, HI, and AVH but reports that his mood is a bit lower today, because he is still very concerned about his health and disposition. Patient endorses that he is not only trying to do as told regarding his T2DM, but he is constantly hungry and also endorsing urge  urinary incontinence. Patient reports that he has frequent bathroom breaks at night and is also struggling to sleep at night. Patient reports that once he had trazodone he was able to fall asleep, and could get up to use the bathroom as needed.    Patient CBG was in the 80s last night and patient did endorse some hypoglycemic symptoms, but was felt better with a snack.   MD Group Therapy: Patient attended and was engaged. Patient was more insightful today; although he struggled with near monopolizing group to talk about his homelessness. However, patient did appear to have a decent grasp about coping skills related to the topic of Distress Tolerance.  Diagnosis:  Final diagnoses:  Dermoid cyst of skin of back  Tobacco abuse  Diabetic polyneuropathy associated with type 2 diabetes mellitus (HCC)  Cocaine use disorder, severe, dependence ([place])    Total Time spent with patient:  1 hour  Past Psychiatric History: See H&P Past Medical History:  Past Medical History:  Diagnosis Date   Angioedema of lips 07/28/2012   left upper (07/29/2012)   Arthritis    hands and back   Chronic back pain    Cocaine abuse (HCC)    Diabetic neuropathy (HCC)    High cholesterol    Hypertension    Neuropathy    Type II diabetes mellitus ([place])     Past Surgical History:  Procedure Laterality Date   BACK SURGERY     CYSTOSCOPY W/ URETERAL STENT PLACEMENT Right 02/23/2021   Procedure: CYSTOSCOPY WITH RETROGRADE PYELOGRAM/URETERAL STENT PLACEMENT;  Surgeon: Janith Lima, MD;  Location: [facility];  Service: Urology;  Laterality: Right;   HERNIA REPAIR Right 04/01/2012   I & D EXTREMITY Left 12/13/2012   Procedure: IRRIGATION AND DEBRIDEMENT LEFT THUMB;  Surgeon: Tennis Must, MD;  Location: [place];  Service: Orthopedics;  Laterality: Left;   INCISION AND DRAINAGE OF WOUND     boil on back/notes 07/14/2008 (07/29/2012)   INGUINAL HERNIA REPAIR  04/01/2012   Procedure: HERNIA REPAIR INGUINAL ADULT;  Surgeon: Haywood Lasso, MD;  Location: [place];  Service: General;  Laterality: Right;   INGUINAL HERNIA REPAIR Left 09/06/2016   Procedure: OPEN REPAIR LEFT INGUINAL HERNIA;  Surgeon: Greer Pickerel, MD;  Location: [place];  Service: General;  Laterality: Left;   INSERTION OF MESH Left 09/06/2016   Procedure: INSERTION OF MESH;  Surgeon: Greer Pickerel, MD;  Location: [facility];  Service: General;  Laterality: Left;   TONSILLECTOMY     Family History:  Family History  Problem Relation Age of Onset   Diabetes Mother    Kidney disease Mother    Hyperlipidemia Mother    Hyperlipidemia Father    Diabetes Father    Family Psychiatric  History: See H&P Social History:  Social History   Substance and Sexual Activity  Alcohol Use No   Alcohol/week: 0.0 standard drinks of alcohol     Social History   Substance and Sexual Activity  Drug Use Not Currently   Types: "Crack" cocaine    Social  History   Socioeconomic History   Marital status: Divorced    Spouse name: Not on file   Number of children: 3   Years of education: 12   Highest education level: Not on file  Occupational History    Comment: disabled  Tobacco Use   Smoking status: Every Day    Packs/day: 0.25    Years: 30.00    Total pack years: 7.50    Types: Cigarettes   Smokeless tobacco: Former    Quit date: 08/15/2015   Tobacco comments:    04/29/16 2  cigs daily, 10/27/17 sometimes < .25 PPD  Vaping Use   Vaping Use: Never used  Substance and Sexual Activity   Alcohol use: No    Alcohol/week: 0.0 standard drinks of alcohol   Drug use: Not Currently    Types: "Crack" cocaine   Sexual activity: Not on file  Other Topics Concern   Not on file  Social History Narrative   04/29/17   Lives in shelter   Caffeine- a lot of  tea, coffee   Social Determinants of Health   Financial Resource Strain: Not on file  Food Insecurity: Not on file  Transportation Needs: Not on file  Physical Activity: Not on file  Stress: Not on file  Social Connections: Not on file   SDOH:  SDOH Screenings   Depression (PHQ2-9): Low Risk  (01/22/2022)  Tobacco Use: High Risk (01/23/2022)   Additional Social History:                         Sleep: Fair  Appetite:   Excessive   Current Medications:  Current Facility-Administered Medications  Medication Dose Route Frequency Provider Last Rate Last Admin   acetaminophen (TYLENOL) tablet 650 mg  650 mg Oral Q6H PRN Merrily Brittle, DO   650 mg at 01/27/22 1127   alum & mag hydroxide-simeth (MAALOX/MYLANTA) 200-200-20 MG/5ML suspension 30 mL  30 mL Oral Q4H PRN Merrily Brittle, DO       amLODipine (NORVASC) tablet 10 mg  10 mg Oral Daily Merrily Brittle, DO   10 mg at 01/27/22 0956   atorvastatin (LIPITOR) tablet 40 mg  40 mg Oral Daily Merrily Brittle, DO   40 mg at 01/27/22 0956   DULoxetine (CYMBALTA) DR capsule 60 mg  60 mg Oral Daily Merrily Brittle, DO   60 mg at  01/27/22 0956   empagliflozin (JARDIANCE) tablet 10 mg  10 mg Oral QAC breakfast Merrily Brittle, DO   10 mg at 01/27/22 0815   famotidine (PEPCID) tablet 10 mg  10 mg Oral BID Damita Dunnings B, MD   10 mg at 01/27/22 0956   feeding supplement (GLUCERNA SHAKE) (GLUCERNA SHAKE) liquid 237 mL  237 mL Oral TID BM Merrily Brittle, DO   237 mL at 01/27/22 1002   gabapentin (  NEURONTIN) capsule 600 mg  600 mg Oral TID Merrily Brittle, DO   600 mg at 01/27/22 0956   glipiZIDE (GLUCOTROL XL) 24 hr tablet 10 mg  10 mg Oral Q breakfast Merrily Brittle, DO   10 mg at 01/27/22 7530   insulin aspart (novoLOG) injection 0-6 Units  0-6 Units Subcutaneous TID WC Merrily Brittle, DO   1 Units at 01/27/22 0816   insulin aspart (novoLOG) injection 5 Units  5 Units Subcutaneous BID AC Merrily Brittle, DO   5 Units at 01/26/22 1619   insulin glargine-yfgn (SEMGLEE) injection 24 Units  24 Units Subcutaneous Daily Minda Ditto, RPH   24 Units at 01/27/22 1038   lubriderm seriously sensitive lotion 1 Application  1 Application Topical BID Merrily Brittle, DO   1 Application at 07/19/00 1002   magnesium hydroxide (MILK OF MAGNESIA) suspension 30 mL  30 mL Oral Daily PRN Merrily Brittle, DO       metFORMIN (GLUCOPHAGE) tablet 1,000 mg  1,000 mg Oral BID WC Merrily Brittle, DO   1,000 mg at 01/27/22 0815   nicotine (NICODERM CQ - dosed in mg/24 hours) patch 21 mg  21 mg Transdermal Q0600 Merrily Brittle, DO   21 mg at 01/27/22 0954   nicotine polacrilex (NICORETTE) gum 4 mg  4 mg Oral PRN Merrily Brittle, DO       tamsulosin [facility]) capsule 0.4 mg  0.4 mg Oral Daily Merrily Brittle, DO   0.4 mg at 01/27/22 1001   traZODone (DESYREL) tablet 50 mg  50 mg Oral QHS PRN Evette Georges, NP   50 mg at 01/26/22 2117   Current Outpatient Medications  Medication Sig Dispense Refill   Accu-Chek Softclix Lancets lancets Use up to 4 times daily as directed 100 each 0   amLODipine (NORVASC) 10 MG tablet Take 1 tablet (10 mg total) by mouth daily. 30 tablet 0    atorvastatin (LIPITOR) 40 MG tablet Take 1 tablet (40 mg total) by mouth daily. 30 tablet 0   Blood Glucose Monitoring Suppl (ACCU-CHEK GUIDE) w/Device KIT USE AS DIRECTED TO CHECK BLOOD SUGARS UP TO TWICE PER DAY 1 kit 0   DULoxetine (CYMBALTA) 60 MG capsule Take 1 capsule (60 mg total) by mouth daily. 30 capsule 0   empagliflozin (JARDIANCE) 10 MG TABS tablet Take 1 tablet (10 mg total) by mouth daily before breakfast. 30 tablet 0   gabapentin (NEURONTIN) 300 MG capsule Take 2 capsules (600 mg total) by mouth 3 (three) times daily. 180 capsule 0   glipiZIDE (GLUCOTROL XL) 10 MG 24 hr tablet Take 1 tablet (10 mg total) by mouth daily with breakfast. 30 tablet 0   glucose blood (ACCU-CHEK GUIDE) test strip Use up to 3 times daily as directed 100 each 3   Insulin Glargine Solostar (LANTUS) 100 UNIT/ML Solostar Pen Inject 24 Units into the skin daily. 15 mL 2   insulin lispro (HUMALOG) 100 UNIT/ML KwikPen Inject 5 Units into the skin 2 (two) times daily before lunch and supper. (Patient not taking: Reported on 01/20/2022) 6 mL 6   Insulin Pen Needle 31G X 8 MM MISC Use with insulin pen 100 each 1   metFORMIN (GLUCOPHAGE) 1000 MG tablet Take 1 tablet (1,000 mg total) by mouth 2 (two) times daily. 60 tablet 0   tamsulosin (FLOMAX) 0.4 MG CAPS capsule Take 1 capsule (0.4 mg total) by mouth daily. For renal calculi 30 capsule 0    Labs  Lab Results:  Admission on 01/21/2022  Component Date Value Ref Range Status   Hgb A1c MFr Bld 01/21/2022 10.4 (H)  4.8 - 5.6 % Final   Comment: (NOTE) Pre diabetes:          5.7%-6.4%  Diabetes:              >6.4%  Glycemic control for   <7.0% adults with diabetes    Mean Plasma Glucose 01/21/2022 251.78  mg/dL Final   Performed at [facility] [address] [place], [place] [address]   TSH 01/21/2022 0.248 (L)  0.350 - 4.500 uIU/mL Final   Comment: Performed by a 3rd Generation assay with a functional sensitivity of <=0.01 uIU/mL. Performed at  [facility], [place] [address] [place], [place] [address]    Glucose-Capillary 01/21/2022 374 (H)  70 - 99 mg/dL Final   Glucose reference range applies only to samples taken after fasting for at least 8 hours.   Glucose-Capillary 01/21/2022 289 (H)  70 - 99 mg/dL Final   Glucose reference range applies only to samples taken after fasting for at least 8 hours.   Glucose-Capillary 01/22/2022 137 (H)  70 - 99 mg/dL Final   Glucose reference range applies only to samples taken after fasting for at least 8 hours.   Glucose-Capillary 01/22/2022 297 (H)  70 - 99 mg/dL Final   Glucose reference range applies only to samples taken after fasting for at least 8 hours.   T3, Free 01/23/2022 2.2  2.0 - 4.4 pg/mL Final   Comment: (NOTE) Performed At: [facility] [address], [place] [id number] Rush Farmer MD VQ:[id number]    Glucose-Capillary 01/22/2022 94  70 - 99 mg/dL Final   Glucose reference range applies only to samples taken after fasting for at least 8 hours.   Free T4 01/23/2022 0.72  0.61 - 1.12 ng/dL Final   Comment: (NOTE) Biotin ingestion may interfere with free T4 tests. If the results are inconsistent with the TSH level, previous test results, or the clinical presentation, then consider biotin interference. If needed, order repeat testing after stopping biotin. Performed at [facility], [place] [address], [place], [place] [address]    TSH 01/23/2022 0.203 (L)  0.350 - 4.500 uIU/mL Final   Comment: Performed by a 3rd Generation assay with a functional sensitivity of <=0.01 uIU/mL. Performed at [facility], [place] [address] [place],  [address]    Glucose-Capillary 01/23/2022 132 (H)  70 - 99 mg/dL Final   Glucose reference range applies only to samples taken after fasting for at least 8 hours.   Glucose-Capillary 01/22/2022 59 (L)  70 - 99 mg/dL Final   Glucose reference range applies only to samples taken after fasting for at least 8  hours.   Glucose-Capillary 01/22/2022 177 (H)  70 - 99 mg/dL Final   Glucose reference range applies only to samples taken after fasting for at least 8 hours.   Glucose-Capillary 01/23/2022 284 (H)  70 - 99 mg/dL Final   Glucose reference range applies only to samples taken after fasting for at least 8 hours.   Glucose-Capillary 01/23/2022 188 (H)  70 - 99 mg/dL Final   Glucose reference range applies only to samples taken after fasting for at least 8 hours.   Glucose-Capillary 01/23/2022 294 (H)  70 - 99 mg/dL Final   Glucose reference range applies only to samples taken after fasting for at least 8 hours.   Glucose-Capillary 01/24/2022 180 (H)  70 -  99 mg/dL Final   Glucose reference range applies only to samples taken after fasting for at least 8 hours.   Glucose-Capillary 01/24/2022 275 (H)  70 - 99 mg/dL Final   Glucose reference range applies only to samples taken after fasting for at least 8 hours.   Glucose-Capillary 01/24/2022 146 (H)  70 - 99 mg/dL Final   Glucose reference range applies only to samples taken after fasting for at least 8 hours.   Glucose-Capillary 01/24/2022 137 (H)  70 - 99 mg/dL Final   Glucose reference range applies only to samples taken after fasting for at least 8 hours.   Glucose-Capillary 01/25/2022 201 (H)  70 - 99 mg/dL Final   Glucose reference range applies only to samples taken after fasting for at least 8 hours.   Glucose-Capillary 01/25/2022 195 (H)  70 - 99 mg/dL Final   Glucose reference range applies only to samples taken after fasting for at least 8 hours.   Glucose-Capillary 01/25/2022 149 (H)  70 - 99 mg/dL Final   Glucose reference range applies only to samples taken after fasting for at least 8 hours.   Glucose-Capillary 01/25/2022 147 (H)  70 - 99 mg/dL Final   Glucose reference range applies only to samples taken after fasting for at least 8 hours.   Glucose-Capillary 01/26/2022 123 (H)  70 - 99 mg/dL Final   Glucose reference range  applies only to samples taken after fasting for at least 8 hours.   Glucose-Capillary 01/26/2022 176 (H)  70 - 99 mg/dL Final   Glucose reference range applies only to samples taken after fasting for at least 8 hours.   Glucose-Capillary 01/26/2022 84  70 - 99 mg/dL Final   Glucose reference range applies only to samples taken after fasting for at least 8 hours.   Glucose-Capillary 01/26/2022 177 (H)  70 - 99 mg/dL Final   Glucose reference range applies only to samples taken after fasting for at least 8 hours.   Glucose-Capillary 01/27/2022 150 (H)  70 - 99 mg/dL Final   Glucose reference range applies only to samples taken after fasting for at least 8 hours.   Glucose-Capillary 01/27/2022 171 (H)  70 - 99 mg/dL Final   Glucose reference range applies only to samples taken after fasting for at least 8 hours.  Admission on 01/20/2022, Discharged on 01/21/2022  Component Date Value Ref Range Status   SARS Coronavirus 2 by RT PCR 01/20/2022 NEGATIVE  NEGATIVE Final   Comment: (NOTE) SARS-CoV-2 target nucleic acids are NOT DETECTED.  The SARS-CoV-2 RNA is generally detectable in upper respiratory specimens during the acute phase of infection. The lowest concentration of SARS-CoV-2 viral copies this assay can detect is 138 copies/mL. A negative result does not preclude SARS-Cov-2 infection and should not be used as the sole basis for treatment or other patient management decisions. A negative result may occur with  improper specimen collection/handling, submission of specimen other than nasopharyngeal swab, presence of viral mutation(s) within the areas targeted by this assay, and inadequate number of viral copies(<138 copies/mL). A negative result must be combined with clinical observations, patient history, and epidemiological information. The expected result is Negative.  Fact Sheet for Patients:  EntrepreneurPulse.com.au  Fact Sheet for Healthcare Providers:   IncredibleEmployment.be  This test is no                          t yet approved or cleared by the [place] FDA  and  has been authorized for detection and/or diagnosis of SARS-CoV-2 by FDA under an Emergency Use Authorization (EUA). This EUA will remain  in effect (meaning this test can be used) for the duration of the COVID-19 declaration under Section 564(b)(1) of the Act, 21 U.S.C.section 360bbb-3(b)(1), unless the authorization is terminated  or revoked sooner.       Influenza A by PCR 01/20/2022 NEGATIVE  NEGATIVE Final   Influenza B by PCR 01/20/2022 NEGATIVE  NEGATIVE Final   Comment: (NOTE) The Xpert Xpress SARS-CoV-2/FLU/RSV plus assay is intended as an aid in the diagnosis of influenza from Nasopharyngeal swab specimens and should not be used as a sole basis for treatment. Nasal washings and aspirates are unacceptable for Xpert Xpress SARS-CoV-2/FLU/RSV testing.  Fact Sheet for Patients: EntrepreneurPulse.com.au  Fact Sheet for Healthcare Providers: IncredibleEmployment.be  This test is not yet approved or cleared by the [place] FDA and has been authorized for detection and/or diagnosis of SARS-CoV-2 by FDA under an Emergency Use Authorization (EUA). This EUA will remain in effect (meaning this test can be used) for the duration of the COVID-19 declaration under Section 564(b)(1) of the Act, 21 U.S.C. section 360bbb-3(b)(1), unless the authorization is terminated or revoked.  Performed at [facility], [place] [address] [place], [place] [address]    Sodium 01/20/2022 136  135 - 145 mmol/L Final   Potassium 01/20/2022 4.0  3.5 - 5.1 mmol/L Final   Chloride 01/20/2022 103  98 - 111 mmol/L Final   CO2 01/20/2022 24  22 - 32 mmol/L Final   Glucose, Bld 01/20/2022 360 (H)  70 - 99 mg/dL Final   Glucose reference range applies only to samples taken after fasting for at least 8 hours.   BUN  01/20/2022 12  8 - 23 mg/dL Final   Creatinine, Ser 01/20/2022 1.01  0.61 - 1.24 mg/dL Final   Calcium 01/20/2022 9.4  8.9 - 10.3 mg/dL Final   Total Protein 01/20/2022 7.3  6.5 - 8.1 g/dL Final   Albumin 01/20/2022 3.4 (L)  3.5 - 5.0 g/dL Final   AST 01/20/2022 14 (L)  15 - 41 U/L Final   ALT 01/20/2022 10  0 - 44 U/L Final   Alkaline Phosphatase 01/20/2022 101  38 - 126 U/L Final   Total Bilirubin 01/20/2022 0.3  0.3 - 1.2 mg/dL Final   GFR, Estimated 01/20/2022 >60  >60 mL/min Final   Comment: (NOTE) Calculated using the CKD-EPI Creatinine Equation (2021)    Anion gap 01/20/2022 9  5 - 15 Final   Performed at [facility] [address], [place], [place] [address]   Alcohol, Ethyl (B) 01/20/2022 <10  <10 mg/dL Final   Comment: (NOTE) Lowest detectable limit for serum alcohol is 10 mg/dL.  For medical purposes only. Performed at [facility], [place] [address] [place],  [address]    Opiates 01/20/2022 NONE DETECTED  NONE DETECTED Final   Cocaine 01/20/2022 POSITIVE (A)  NONE DETECTED Final   Benzodiazepines 01/20/2022 NONE DETECTED  NONE DETECTED Final   Amphetamines 01/20/2022 NONE DETECTED  NONE DETECTED Final   Tetrahydrocannabinol 01/20/2022 NONE DETECTED  NONE DETECTED Final   Barbiturates 01/20/2022 NONE DETECTED  NONE DETECTED Final   Comment: (NOTE) DRUG SCREEN FOR MEDICAL PURPOSES ONLY.  IF CONFIRMATION IS NEEDED FOR ANY PURPOSE, NOTIFY LAB WITHIN 5 DAYS.  LOWEST DETECTABLE LIMITS FOR URINE DRUG SCREEN Drug Class  Cutoff (ng/mL) Amphetamine and metabolites    1000 Barbiturate and metabolites    200 Benzodiazepine                 200 Opiates and metabolites        300 Cocaine and metabolites        300 THC                            50 Performed at [facility], [place] [address] [place], [place] [address]    WBC 01/20/2022 5.2  4.0 - 10.5 K/uL Final   RBC 01/20/2022 4.73  4.22 - 5.81 MIL/uL Final   Hemoglobin  01/20/2022 14.3  13.0 - 17.0 g/dL Final   HCT 01/20/2022 43.1  39.0 - 52.0 % Final   MCV 01/20/2022 91.1  80.0 - 100.0 fL Final   MCH 01/20/2022 30.2  26.0 - 34.0 pg Final   MCHC 01/20/2022 33.2  30.0 - 36.0 g/dL Final   RDW 01/20/2022 14.1  11.5 - 15.5 % Final   Platelets 01/20/2022 444 (H)  150 - 400 K/uL Final   nRBC 01/20/2022 0.0  0.0 - 0.2 % Final   Neutrophils Relative % 01/20/2022 61  % Final   Neutro Abs 01/20/2022 3.2  1.7 - 7.7 K/uL Final   Lymphocytes Relative 01/20/2022 29  % Final   Lymphs Abs 01/20/2022 1.5  0.7 - 4.0 K/uL Final   Monocytes Relative 01/20/2022 7  % Final   Monocytes Absolute 01/20/2022 0.4  0.1 - 1.0 K/uL Final   Eosinophils Relative 01/20/2022 2  % Final   Eosinophils Absolute 01/20/2022 0.1  0.0 - 0.5 K/uL Final   Basophils Relative 01/20/2022 1  % Final   Basophils Absolute 01/20/2022 0.0  0.0 - 0.1 K/uL Final   Immature Granulocytes 01/20/2022 0  % Final   Abs Immature Granulocytes 01/20/2022 0.01  0.00 - 0.07 K/uL Final   Performed at [facility], [place] [address], [place], [place] [address]   Salicylate Lvl 88/87/5797 <7.0 (L)  7.0 - 30.0 mg/dL Final   Performed at [facility] [address] [place], [place] [address]   Acetaminophen (Tylenol), Serum 01/20/2022 <10 (L)  10 - 30 ug/mL Final   Comment: (NOTE) Therapeutic concentrations vary significantly. A range of 10-30 ug/mL  may be an effective concentration for many patients. However, some  are best treated at concentrations outside of this range. Acetaminophen concentrations >150 ug/mL at 4 hours after ingestion  and >50 ug/mL at 12 hours after ingestion are often associated with  toxic reactions.  Performed at [facility], [place] [address], [place], [place] [address]    Glucose-Capillary 01/20/2022 344 (H)  70 - 99 mg/dL Final   Glucose reference range applies only to samples taken after fasting for at least 8 hours.   Comment 1 01/20/2022 Notify RN   Final   Comment  2 01/20/2022 Document in Chart   Final   Glucose-Capillary 01/20/2022 479 (H)  70 - 99 mg/dL Final   Glucose reference range applies only to samples taken after fasting for at least 8 hours.   Glucose-Capillary 01/21/2022 243 (H)  70 - 99 mg/dL Final   Glucose reference range applies only to samples taken after fasting for at least 8 hours.   Glucose-Capillary 01/21/2022 224 (H)  70 - 99 mg/dL Final   Glucose reference range applies only to samples  taken after fasting for at least 8 hours.   Glucose-Capillary 01/21/2022 282 (H)  70 - 99 mg/dL Final   Glucose reference range applies only to samples taken after fasting for at least 8 hours.  Admission on 01/18/2022, Discharged on 01/19/2022  Component Date Value Ref Range Status   WBC 01/18/2022 5.3  4.0 - 10.5 K/uL Final   RBC 01/18/2022 4.47  4.22 - 5.81 MIL/uL Final   Hemoglobin 01/18/2022 13.7  13.0 - 17.0 g/dL Final   HCT 01/18/2022 41.0  39.0 - 52.0 % Final   MCV 01/18/2022 91.7  80.0 - 100.0 fL Final   MCH 01/18/2022 30.6  26.0 - 34.0 pg Final   MCHC 01/18/2022 33.4  30.0 - 36.0 g/dL Final   RDW 01/18/2022 14.2  11.5 - 15.5 % Final   Platelets 01/18/2022 445 (H)  150 - 400 K/uL Final   nRBC 01/18/2022 0.0  0.0 - 0.2 % Final   Performed at [facility], [place] [address], [place], [place] [address]   Sodium 01/18/2022 136  135 - 145 mmol/L Final   Potassium 01/18/2022 4.3  3.5 - 5.1 mmol/L Final   Chloride 01/18/2022 100  98 - 111 mmol/L Final   CO2 01/18/2022 22  22 - 32 mmol/L Final   Glucose, Bld 01/18/2022 499 (H)  70 - 99 mg/dL Final   Glucose reference range applies only to samples taken after fasting for at least 8 hours.   BUN 01/18/2022 9  8 - 23 mg/dL Final   Creatinine, Ser 01/18/2022 1.23  0.61 - 1.24 mg/dL Final   Calcium 01/18/2022 9.1  8.9 - 10.3 mg/dL Final   Total Protein 01/18/2022 6.6  6.5 - 8.1 g/dL Final   Albumin 01/18/2022 3.2 (L)  3.5 - 5.0 g/dL Final   AST 01/18/2022 18  15 - 41 U/L Final   ALT  01/18/2022 9  0 - 44 U/L Final   Alkaline Phosphatase 01/18/2022 95  38 - 126 U/L Final   Total Bilirubin 01/18/2022 0.3  0.3 - 1.2 mg/dL Final   GFR, Estimated 01/18/2022 >60  >60 mL/min Final   Comment: (NOTE) Calculated using the CKD-EPI Creatinine Equation (2021)    Anion gap 01/18/2022 14  5 - 15 Final   Performed at [facility] [address], [place], [place] [address]   Color, Urine 01/18/2022 YELLOW  YELLOW Final   APPearance 01/18/2022 CLEAR  CLEAR Final   Specific Gravity, Urine 01/18/2022 1.025  1.005 - 1.030 Final   pH 01/18/2022 6.0  5.0 - 8.0 Final   Glucose, UA 01/18/2022 >=500 (A)  NEGATIVE mg/dL Final   Hgb urine dipstick 01/18/2022 NEGATIVE  NEGATIVE Final   Bilirubin Urine 01/18/2022 NEGATIVE  NEGATIVE Final   Ketones, ur 01/18/2022 NEGATIVE  NEGATIVE mg/dL Final   Protein, ur 01/18/2022 NEGATIVE  NEGATIVE mg/dL Final   Nitrite 01/18/2022 NEGATIVE  NEGATIVE Final   Leukocytes,Ua 01/18/2022 SMALL (A)  NEGATIVE Final   RBC / HPF 01/18/2022 0-5  0 - 5 RBC/hpf Final   WBC, UA 01/18/2022 21-50  0 - 5 WBC/hpf Final   Bacteria, UA 01/18/2022 RARE (A)  NONE SEEN Final   Squamous Epithelial / LPF 01/18/2022 0-5  0 - 5 Final   Budding Yeast 01/18/2022 PRESENT   Final   Performed at [facility], [place] [address] [place], [place] [address]   Opiates 01/18/2022 NONE DETECTED  NONE DETECTED Final   Cocaine 01/18/2022 POSITIVE (A)  NONE DETECTED Final   Benzodiazepines 01/18/2022 NONE DETECTED  NONE DETECTED Final   Amphetamines 01/18/2022 NONE DETECTED  NONE DETECTED Final   Tetrahydrocannabinol 01/18/2022 NONE DETECTED  NONE DETECTED Final   Barbiturates 01/18/2022 NONE DETECTED  NONE DETECTED Final   Comment: (NOTE) DRUG SCREEN FOR MEDICAL PURPOSES ONLY.  IF CONFIRMATION IS NEEDED FOR ANY PURPOSE, NOTIFY LAB WITHIN 5 DAYS.  LOWEST DETECTABLE LIMITS FOR URINE DRUG SCREEN Drug Class                     Cutoff (ng/mL) Amphetamine and metabolites     1000 Barbiturate and metabolites    200 Benzodiazepine                 200 Opiates and metabolites        300 Cocaine and metabolites        300 THC                            50 Performed at [facility], [place] [address] [place], [place] [address]    Glucose-Capillary 01/18/2022 483 (H)  70 - 99 mg/dL Final   Glucose reference range applies only to samples taken after fasting for at least 8 hours.   Glucose-Capillary 01/19/2022 586 (HH)  70 - 99 mg/dL Final   Glucose reference range applies only to samples taken after fasting for at least 8 hours.   Glucose-Capillary 01/19/2022 456 (H)  70 - 99 mg/dL Final   Glucose reference range applies only to samples taken after fasting for at least 8 hours.   Glucose-Capillary 01/19/2022 515 (HH)  70 - 99 mg/dL Final   Glucose reference range applies only to samples taken after fasting for at least 8 hours.   Comment 1 01/19/2022 Notify RN   Final   Glucose-Capillary 01/19/2022 164 (H)  70 - 99 mg/dL Final   Glucose reference range applies only to samples taken after fasting for at least 8 hours.  Admission on 01/07/2022, Discharged on 01/07/2022  Component Date Value Ref Range Status   Glucose-Capillary 01/07/2022 267 (H)  70 - 99 mg/dL Final   Glucose reference range applies only to samples taken after fasting for at least 8 hours.  Admission on 01/02/2022, Discharged on 01/02/2022  Component Date Value Ref Range Status   WBC 01/02/2022 7.0  4.0 - 10.5 K/uL Final   RBC 01/02/2022 4.54  4.22 - 5.81 MIL/uL Final   Hemoglobin 01/02/2022 13.9  13.0 - 17.0 g/dL Final   HCT 01/02/2022 41.4  39.0 - 52.0 % Final   MCV 01/02/2022 91.2  80.0 - 100.0 fL Final   MCH 01/02/2022 30.6  26.0 - 34.0 pg Final   MCHC 01/02/2022 33.6  30.0 - 36.0 g/dL Final   RDW 01/02/2022 14.1  11.5 - 15.5 % Final   Platelets 01/02/2022 369  150 - 400 K/uL Final   nRBC 01/02/2022 0.0  0.0 - 0.2 % Final   Neutrophils Relative % 01/02/2022 59  % Final   Neutro Abs  01/02/2022 4.1  1.7 - 7.7 K/uL Final   Lymphocytes Relative 01/02/2022 28  % Final   Lymphs Abs 01/02/2022 1.9  0.7 - 4.0 K/uL Final   Monocytes Relative 01/02/2022 8  % Final   Monocytes Absolute 01/02/2022 0.5  0.1 - 1.0 K/uL Final   Eosinophils Relative 01/02/2022 4  % Final   Eosinophils Absolute 01/02/2022 0.3  0.0 -  0.5 K/uL Final   Basophils Relative 01/02/2022 1  % Final   Basophils Absolute 01/02/2022 0.1  0.0 - 0.1 K/uL Final   Immature Granulocytes 01/02/2022 0  % Final   Abs Immature Granulocytes 01/02/2022 0.02  0.00 - 0.07 K/uL Final   Performed at [facility], [place] [address], [place], [place] [address]   Sodium 01/02/2022 138  135 - 145 mmol/L Final   Potassium 01/02/2022 4.1  3.5 - 5.1 mmol/L Final   Chloride 01/02/2022 104  98 - 111 mmol/L Final   CO2 01/02/2022 25  22 - 32 mmol/L Final   Glucose, Bld 01/02/2022 216 (H)  70 - 99 mg/dL Final   Glucose reference range applies only to samples taken after fasting for at least 8 hours.   BUN 01/02/2022 11  8 - 23 mg/dL Final   Creatinine, Ser 01/02/2022 0.98  0.61 - 1.24 mg/dL Final   Calcium 01/02/2022 9.5  8.9 - 10.3 mg/dL Final   Total Protein 01/02/2022 7.4  6.5 - 8.1 g/dL Final   Albumin 01/02/2022 3.4 (L)  3.5 - 5.0 g/dL Final   AST 01/02/2022 12 (L)  15 - 41 U/L Final   ALT 01/02/2022 11  0 - 44 U/L Final   Alkaline Phosphatase 01/02/2022 90  38 - 126 U/L Final   Total Bilirubin 01/02/2022 0.9  0.3 - 1.2 mg/dL Final   GFR, Estimated 01/02/2022 >60  >60 mL/min Final   Comment: (NOTE) Calculated using the CKD-EPI Creatinine Equation (2021)    Anion gap 01/02/2022 9  5 - 15 Final   Performed at [facility] [address] [place], [place] [address]  [facility] on 11/01/2021  Component Date Value Ref Range Status   POC Glucose 11/27/2021 301 (A)  70 - 99 mg/dl Final   1 hr after lunch, had not taken insulin  Admission on 10/13/2021, Discharged on 10/15/2021  Component Date Value  Ref Range Status   Glucose-Capillary 10/13/2021 388 (H)  70 - 99 mg/dL Final   Glucose reference range applies only to samples taken after fasting for at least 8 hours.   SARS Coronavirus 2 by RT PCR 10/13/2021 NEGATIVE  NEGATIVE Final   Comment: (NOTE) SARS-CoV-2 target nucleic acids are NOT DETECTED.  The SARS-CoV-2 RNA is generally detectable in upper respiratory specimens during the acute phase of infection. The lowest concentration of SARS-CoV-2 viral copies this assay can detect is 138 copies/mL. A negative result does not preclude SARS-Cov-2 infection and should not be used as the sole basis for treatment or other patient management decisions. A negative result may occur with  improper specimen collection/handling, submission of specimen other than nasopharyngeal swab, presence of viral mutation(s) within the areas targeted by this assay, and inadequate number of viral copies(<138 copies/mL). A negative result must be combined with clinical observations, patient history, and epidemiological information. The expected result is Negative.  Fact Sheet for Patients:  EntrepreneurPulse.com.au  Fact Sheet for Healthcare Providers:  IncredibleEmployment.be  This test is no                          t yet approved or cleared by the [place] FDA and  has been authorized for detection and/or diagnosis of SARS-CoV-2 by FDA under an Emergency Use Authorization (EUA). This EUA will remain  in effect (meaning this test can be used) for the duration of the COVID-19 declaration under Section 564(b)(1) of the  Act, 21 U.S.C.section 360bbb-3(b)(1), unless the authorization is terminated  or revoked sooner.       Influenza A by PCR 10/13/2021 NEGATIVE  NEGATIVE Final   Influenza B by PCR 10/13/2021 NEGATIVE  NEGATIVE Final   Comment: (NOTE) The Xpert Xpress SARS-CoV-2/FLU/RSV plus assay is intended as an aid in the diagnosis of influenza from  Nasopharyngeal swab specimens and should not be used as a sole basis for treatment. Nasal washings and aspirates are unacceptable for Xpert Xpress SARS-CoV-2/FLU/RSV testing.  Fact Sheet for Patients: EntrepreneurPulse.com.au  Fact Sheet for Healthcare Providers: IncredibleEmployment.be  This test is not yet approved or cleared by the [place] FDA and has been authorized for detection and/or diagnosis of SARS-CoV-2 by FDA under an Emergency Use Authorization (EUA). This EUA will remain in effect (meaning this test can be used) for the duration of the COVID-19 declaration under Section 564(b)(1) of the Act, 21 U.S.C. section 360bbb-3(b)(1), unless the authorization is terminated or revoked.  Performed at [facility], [place] [address], [place], [place] [address]    WBC 10/13/2021 9.5  4.0 - 10.5 K/uL Final   RBC 10/13/2021 4.23  4.22 - 5.81 MIL/uL Final   Hemoglobin 10/13/2021 13.0  13.0 - 17.0 g/dL Final   HCT 10/13/2021 40.4  39.0 - 52.0 % Final   MCV 10/13/2021 95.5  80.0 - 100.0 fL Final   MCH 10/13/2021 30.7  26.0 - 34.0 pg Final   MCHC 10/13/2021 32.2  30.0 - 36.0 g/dL Final   RDW 10/13/2021 13.4  11.5 - 15.5 % Final   Platelets 10/13/2021 551 (H)  150 - 400 K/uL Final   nRBC 10/13/2021 0.0  0.0 - 0.2 % Final   Neutrophils Relative % 10/13/2021 68  % Final   Neutro Abs 10/13/2021 6.5  1.7 - 7.7 K/uL Final   Lymphocytes Relative 10/13/2021 18  % Final   Lymphs Abs 10/13/2021 1.7  0.7 - 4.0 K/uL Final   Monocytes Relative 10/13/2021 9  % Final   Monocytes Absolute 10/13/2021 0.8  0.1 - 1.0 K/uL Final   Eosinophils Relative 10/13/2021 3  % Final   Eosinophils Absolute 10/13/2021 0.3  0.0 - 0.5 K/uL Final   Basophils Relative 10/13/2021 1  % Final   Basophils Absolute 10/13/2021 0.1  0.0 - 0.1 K/uL Final   Immature Granulocytes 10/13/2021 1  % Final   Abs Immature Granulocytes 10/13/2021 0.11 (H)  0.00 - 0.07 K/uL Final    Performed at [facility], [place] [address] [place], [place] [address]   Sodium 10/13/2021 136  135 - 145 mmol/L Final   Potassium 10/13/2021 4.3  3.5 - 5.1 mmol/L Final   Chloride 10/13/2021 104  98 - 111 mmol/L Final   CO2 10/13/2021 24  22 - 32 mmol/L Final   Glucose, Bld 10/13/2021 296 (H)  70 - 99 mg/dL Final   Glucose reference range applies only to samples taken after fasting for at least 8 hours.   BUN 10/13/2021 14  8 - 23 mg/dL Final   Creatinine, Ser 10/13/2021 1.15  0.61 - 1.24 mg/dL Final   Calcium 10/13/2021 9.0  8.9 - 10.3 mg/dL Final   Total Protein 10/13/2021 7.5  6.5 - 8.1 g/dL Final   Albumin 10/13/2021 3.0 (L)  3.5 - 5.0 g/dL Final   AST 10/13/2021 13 (L)  15 - 41 U/L Final   ALT 10/13/2021 10  0 - 44 U/L Final   Alkaline Phosphatase 10/13/2021 84  38 - 126 U/L Final   Total Bilirubin 10/13/2021 0.5  0.3 - 1.2 mg/dL Final   GFR, Estimated 10/13/2021 >60  >60 mL/min Final   Comment: (NOTE) Calculated using the CKD-EPI Creatinine Equation (2021)    Anion gap 10/13/2021 8  5 - 15 Final   Performed at [facility] [address] [place], [place] [address]   Color, Urine 10/13/2021 YELLOW  YELLOW Final   APPearance 10/13/2021 CLOUDY (A)  CLEAR Final   Specific Gravity, Urine 10/13/2021 1.022  1.005 - 1.030 Final   pH 10/13/2021 5.0  5.0 - 8.0 Final   Glucose, UA 10/13/2021 >=500 (A)  NEGATIVE mg/dL Final   Hgb urine dipstick 10/13/2021 LARGE (A)  NEGATIVE Final   Bilirubin Urine 10/13/2021 NEGATIVE  NEGATIVE Final   Ketones, ur 10/13/2021 NEGATIVE  NEGATIVE mg/dL Final   Protein, ur 10/13/2021 30 (A)  NEGATIVE mg/dL Final   Nitrite 10/13/2021 NEGATIVE  NEGATIVE Final   Leukocytes,Ua 10/13/2021 LARGE (A)  NEGATIVE Final   RBC / HPF 10/13/2021 >50 (H)  0 - 5 RBC/hpf Final   WBC, UA 10/13/2021 >50 (H)  0 - 5 WBC/hpf Final   Bacteria, UA 10/13/2021 RARE (A)  NONE SEEN Final   Squamous Epithelial / LPF 10/13/2021 0-5  0 - 5 Final   WBC Clumps 10/13/2021  PRESENT   Final   Mucus 10/13/2021 PRESENT   Final   Budding Yeast 10/13/2021 PRESENT   Final   Non Squamous Epithelial 10/13/2021 0-5 (A)  NONE SEEN Final   Performed at [facility], [place] [address], [place], [place] [address]   pH, Ven 10/13/2021 7.457 (H)  7.25 - 7.43 Final   pCO2, Ven 10/13/2021 38.1 (L)  44 - 60 mmHg Final   pO2, Ven 10/13/2021 174 (H)  32 - 45 mmHg Final   Bicarbonate 10/13/2021 26.9  20.0 - 28.0 mmol/L Final   TCO2 10/13/2021 28  22 - 32 mmol/L Final   O2 Saturation 10/13/2021 100  % Final   Acid-Base Excess 10/13/2021 3.0 (H)  0.0 - 2.0 mmol/L Final   Sodium 10/13/2021 137  135 - 145 mmol/L Final   Potassium 10/13/2021 4.3  3.5 - 5.1 mmol/L Final   Calcium, Ion 10/13/2021 1.10 (L)  1.15 - 1.40 mmol/L Final   HCT 10/13/2021 40.0  39.0 - 52.0 % Final   Hemoglobin 10/13/2021 13.6  13.0 - 17.0 g/dL Final   Sample type 10/13/2021 VENOUS   Final   Opiates 10/13/2021 NONE DETECTED  NONE DETECTED Final   Cocaine 10/13/2021 POSITIVE (A)  NONE DETECTED Final   Benzodiazepines 10/13/2021 NONE DETECTED  NONE DETECTED Final   Amphetamines 10/13/2021 NONE DETECTED  NONE DETECTED Final   Tetrahydrocannabinol 10/13/2021 NONE DETECTED  NONE DETECTED Final   Barbiturates 10/13/2021 NONE DETECTED  NONE DETECTED Final   Comment: (NOTE) DRUG SCREEN FOR MEDICAL PURPOSES ONLY.  IF CONFIRMATION IS NEEDED FOR ANY PURPOSE, NOTIFY LAB WITHIN 5 DAYS.  LOWEST DETECTABLE LIMITS FOR URINE DRUG SCREEN Drug Class                     Cutoff (ng/mL) Amphetamine and metabolites    1000 Barbiturate and metabolites    200 Benzodiazepine                 338 Tricyclics and metabolites     300 Opiates and metabolites        300 Cocaine and metabolites  300 THC                            50 Performed at [facility], [place] [address], [place], [place] [address]    Adenovirus 10/13/2021 NOT DETECTED  NOT DETECTED Final   Coronavirus 229E 10/13/2021 NOT DETECTED  NOT DETECTED  Final   Comment: (NOTE) The Coronavirus on the Respiratory Panel, DOES NOT test for the novel  Coronavirus (2019 nCoV)    Coronavirus HKU1 10/13/2021 NOT DETECTED  NOT DETECTED Final   Coronavirus NL63 10/13/2021 NOT DETECTED  NOT DETECTED Final   Coronavirus OC43 10/13/2021 NOT DETECTED  NOT DETECTED Final   Metapneumovirus 10/13/2021 NOT DETECTED  NOT DETECTED Final   Rhinovirus / Enterovirus 10/13/2021 NOT DETECTED  NOT DETECTED Final   Influenza A 10/13/2021 NOT DETECTED  NOT DETECTED Final   Influenza B 10/13/2021 NOT DETECTED  NOT DETECTED Final   Parainfluenza Virus 1 10/13/2021 NOT DETECTED  NOT DETECTED Final   Parainfluenza Virus 2 10/13/2021 NOT DETECTED  NOT DETECTED Final   Parainfluenza Virus 3 10/13/2021 NOT DETECTED  NOT DETECTED Final   Parainfluenza Virus 4 10/13/2021 NOT DETECTED  NOT DETECTED Final   Respiratory Syncytial Virus 10/13/2021 NOT DETECTED  NOT DETECTED Final   Bordetella pertussis 10/13/2021 NOT DETECTED  NOT DETECTED Final   Bordetella Parapertussis 10/13/2021 NOT DETECTED  NOT DETECTED Final   Chlamydophila pneumoniae 10/13/2021 NOT DETECTED  NOT DETECTED Final   Mycoplasma pneumoniae 10/13/2021 NOT DETECTED  NOT DETECTED Final   Performed at [facility], [place] [address], [place], [place] [address]   Procalcitonin 10/13/2021 <0.10  ng/mL Final   Comment:        Interpretation: PCT (Procalcitonin) <= 0.5 ng/mL: Systemic infection (sepsis) is not likely. Local bacterial infection is possible. (NOTE)       Sepsis PCT Algorithm           Lower Respiratory Tract                                      Infection PCT Algorithm    ----------------------------     ----------------------------         PCT < 0.25 ng/mL                PCT < 0.10 ng/mL          Strongly encourage             Strongly discourage   discontinuation of antibiotics    initiation of antibiotics    ----------------------------     -----------------------------       PCT 0.25 -  0.50 ng/mL            PCT 0.10 - 0.25 ng/mL               OR       >80% decrease in PCT            Discourage initiation of                                            antibiotics      Encourage discontinuation           of antibiotics    ----------------------------     -----------------------------  PCT >= 0.50 ng/mL              PCT 0.26 - 0.50 ng/mL               AND                                 <80% decrease in PCT             Encourage initiation of                                             antibiotics       Encourage continuation           of antibiotics    ----------------------------     -----------------------------        PCT >= 0.50 ng/mL                  PCT > 0.50 ng/mL               AND         increase in PCT                  Strongly encourage                                      initiation of antibiotics    Strongly encourage escalation           of antibiotics                                     -----------------------------                                           PCT <= 0.25 ng/mL                                                 OR                                        > 80% decrease in PCT                                      Discontinue / Do not initiate                                             antibiotics  Performed at [facility], [address] [place], [place] [address]    B Natriuretic Peptide 10/13/2021 23.6  0.0 - 100.0 pg/mL Final   Performed at [facility] [address]  [place], [place] [address]   L. pneumophila Serogp 1 Ur Ag 10/13/2021 Negative  Negative Final   Comment: (NOTE) Presumptive negative for L. pneumophila serogroup 1 antigen in urine, suggesting no recent or current infection. Legionnaires' disease cannot be ruled out since other serogroups and species may also cause disease. Performed At: [facility], [place] [id number] Rush Farmer MD JT:[id number]    Source  of Sample 10/13/2021 URINE, RANDOM   Final   Performed at [facility], [place] [address] [place], [place] [address]   Strep Pneumo Urinary Antigen 10/13/2021 NEGATIVE  NEGATIVE Final   Comment:        Infection due to S. pneumoniae cannot be absolutely ruled out since the antigen present may be below the detection limit of the test. Performed at [facility], [address], [place], [place] [address]    Sodium 10/14/2021 138  135 - 145 mmol/L Final   Potassium 10/14/2021 4.3  3.5 - 5.1 mmol/L Final   Chloride 10/14/2021 106  98 - 111 mmol/L Final   CO2 10/14/2021 25  22 - 32 mmol/L Final   Glucose, Bld 10/14/2021 286 (H)  70 - 99 mg/dL Final   Glucose reference range applies only to samples taken after fasting for at least 8 hours.   BUN 10/14/2021 14  8 - 23 mg/dL Final   Creatinine, Ser 10/14/2021 0.98  0.61 - 1.24 mg/dL Final   Calcium 10/14/2021 8.9  8.9 - 10.3 mg/dL Final   GFR, Estimated 10/14/2021 >60  >60 mL/min Final   Comment: (NOTE) Calculated using the CKD-EPI Creatinine Equation (2021)    Anion gap 10/14/2021 7  5 - 15 Final   Performed at [facility], [place] [address] [place], [place] [address]   WBC 10/14/2021 9.6  4.0 - 10.5 K/uL Final   RBC 10/14/2021 4.12 (L)  4.22 - 5.81 MIL/uL Final   Hemoglobin 10/14/2021 12.6 (L)  13.0 - 17.0 g/dL Final   HCT 10/14/2021 38.8 (L)  39.0 - 52.0 % Final   MCV 10/14/2021 94.2  80.0 - 100.0 fL Final   MCH 10/14/2021 30.6  26.0 - 34.0 pg Final   MCHC 10/14/2021 32.5  30.0 - 36.0 g/dL Final   RDW 10/14/2021 13.4  11.5 - 15.5 % Final   Platelets 10/14/2021 556 (H)  150 - 400 K/uL Final   nRBC 10/14/2021 0.0  0.0 - 0.2 % Final   Performed at [facility], [place] [address] [place], [place] [address]   Glucose-Capillary 10/13/2021 281 (H)  70 - 99 mg/dL Final   Glucose reference range applies only to samples taken after fasting for at least 8 hours.   Glucose-Capillary 10/13/2021 399 (H)  70 - 99 mg/dL Final    Glucose reference range applies only to samples taken after fasting for at least 8 hours.   Glucose-Capillary 10/14/2021 239 (H)  70 - 99 mg/dL Final   Glucose reference range applies only to samples taken after fasting for at least 8 hours.   Glucose-Capillary 10/14/2021 345 (H)  70 - 99 mg/dL Final   Glucose reference range applies only to samples taken after fasting for at least 8 hours.   Glucose-Capillary 10/14/2021 383 (H)  70 - 99 mg/dL Final   Glucose reference range applies only to samples taken after fasting for at least 8 hours.   Glucose-Capillary 10/15/2021 194 (H)  70 - 99 mg/dL Final   Glucose reference range  applies only to samples taken after fasting for at least 8 hours.   Glucose-Capillary 10/15/2021 161 (H)  70 - 99 mg/dL Final   Glucose reference range applies only to samples taken after fasting for at least 8 hours.   Glucose-Capillary 10/15/2021 312 (H)  70 - 99 mg/dL Final   Glucose reference range applies only to samples taken after fasting for at least 8 hours.   Glucose-Capillary 10/14/2021 279 (H)  70 - 99 mg/dL Final   Glucose reference range applies only to samples taken after fasting for at least 8 hours.  Office Visit on 10/08/2021  Component Date Value Ref Range Status   POC Glucose 10/08/2021 321 (A)  70 - 99 mg/dl Final   HbA1c, POC (controlled diabetic ra* 10/08/2021 9.3 (A)  0.0 - 7.0 % Final  Admission on 10/07/2021, Discharged on 10/07/2021  Component Date Value Ref Range Status   Color, Urine 10/07/2021 YELLOW  YELLOW Final   APPearance 10/07/2021 HAZY (A)  CLEAR Final   Specific Gravity, Urine 10/07/2021 1.010  1.005 - 1.030 Final   pH 10/07/2021 5.0  5.0 - 8.0 Final   Glucose, UA 10/07/2021 >=500 (A)  NEGATIVE mg/dL Final   Hgb urine dipstick 10/07/2021 MODERATE (A)  NEGATIVE Final   Bilirubin Urine 10/07/2021 NEGATIVE  NEGATIVE Final   Ketones, ur 10/07/2021 NEGATIVE  NEGATIVE mg/dL Final   Protein, ur 10/07/2021 30 (A)  NEGATIVE mg/dL Final    Nitrite 10/07/2021 NEGATIVE  NEGATIVE Final   Leukocytes,Ua 10/07/2021 LARGE (A)  NEGATIVE Final   RBC / HPF 10/07/2021 21-50  0 - 5 RBC/hpf Final   WBC, UA 10/07/2021 >50 (H)  0 - 5 WBC/hpf Final   Bacteria, UA 10/07/2021 NONE SEEN  NONE SEEN Final   WBC Clumps 10/07/2021 PRESENT   Final   Mucus 10/07/2021 PRESENT   Final   Budding Yeast 10/07/2021 PRESENT   Final   Performed at [facility], [place] [address] [place], [place] [address]   WBC 10/07/2021 8.9  4.0 - 10.5 K/uL Final   RBC 10/07/2021 4.35  4.22 - 5.81 MIL/uL Final   Hemoglobin 10/07/2021 13.7  13.0 - 17.0 g/dL Final   HCT 10/07/2021 41.9  39.0 - 52.0 % Final   MCV 10/07/2021 96.3  80.0 - 100.0 fL Final   MCH 10/07/2021 31.5  26.0 - 34.0 pg Final   MCHC 10/07/2021 32.7  30.0 - 36.0 g/dL Final   RDW 10/07/2021 13.4  11.5 - 15.5 % Final   Platelets 10/07/2021 376  150 - 400 K/uL Final   nRBC 10/07/2021 0.0  0.0 - 0.2 % Final   Neutrophils Relative % 10/07/2021 59  % Final   Neutro Abs 10/07/2021 5.2  1.7 - 7.7 K/uL Final   Lymphocytes Relative 10/07/2021 17  % Final   Lymphs Abs 10/07/2021 1.5  0.7 - 4.0 K/uL Final   Monocytes Relative 10/07/2021 19  % Final   Monocytes Absolute 10/07/2021 1.7 (H)  0.1 - 1.0 K/uL Final   Eosinophils Relative 10/07/2021 5  % Final   Eosinophils Absolute 10/07/2021 0.5  0.0 - 0.5 K/uL Final   Basophils Relative 10/07/2021 0  % Final   Basophils Absolute 10/07/2021 0.0  0.0 - 0.1 K/uL Final   Immature Granulocytes 10/07/2021 0  % Final   Abs Immature Granulocytes 10/07/2021 0.04  0.00 - 0.07 K/uL Final   Performed at [facility], [place] [address] [place], [place] [address]   Sodium 10/07/2021  141  135 - 145 mmol/L Final   Potassium 10/07/2021 4.0  3.5 - 5.1 mmol/L Final   Chloride 10/07/2021 107  98 - 111 mmol/L Final   CO2 10/07/2021 24  22 - 32 mmol/L Final   Glucose, Bld 10/07/2021 81  70 - 99 mg/dL Final   Glucose reference range applies only to  samples taken after fasting for at least 8 hours.   BUN 10/07/2021 14  8 - 23 mg/dL Final   Creatinine, Ser 10/07/2021 1.19  0.61 - 1.24 mg/dL Final   Calcium 10/07/2021 9.4  8.9 - 10.3 mg/dL Final   Total Protein 10/07/2021 7.9  6.5 - 8.1 g/dL Final   Albumin 10/07/2021 3.5  3.5 - 5.0 g/dL Final   AST 10/07/2021 12 (L)  15 - 41 U/L Final   ALT 10/07/2021 10  0 - 44 U/L Final   Alkaline Phosphatase 10/07/2021 73  38 - 126 U/L Final   Total Bilirubin 10/07/2021 0.7  0.3 - 1.2 mg/dL Final   GFR, Estimated 10/07/2021 >60  >60 mL/min Final   Comment: (NOTE) Calculated using the CKD-EPI Creatinine Equation (2021)    Anion gap 10/07/2021 10  5 - 15 Final   Performed at [facility], [place] [address], [place], [place] [address]   Specimen Description 10/07/2021    Final                   Value:URINE, CLEAN CATCH Performed at [facility], [place] [address] [place], [place] [address]    Special Requests 10/07/2021    Final                   Value:NONE Performed at [facility], [place] [address], [place], [place] [address]    Culture 10/07/2021  (A)   Final                   Value:40,000 COLONIES/mL YEAST 20,000 COLONIES/mL LACTOBACILLUS SPECIES Standardized susceptibility testing for this organism is not available. Performed at [facility], [place] [address], [place], [place] [address]    Report Status 10/07/2021 10/08/2021 FINAL   Final  Admission on 10/04/2021, Discharged on 10/04/2021  Component Date Value Ref Range Status   WBC 10/04/2021 9.3  4.0 - 10.5 K/uL Final   RBC 10/04/2021 4.26  4.22 - 5.81 MIL/uL Final   Hemoglobin 10/04/2021 13.4  13.0 - 17.0 g/dL Final   HCT 10/04/2021 41.5  39.0 - 52.0 % Final   MCV 10/04/2021 97.4  80.0 - 100.0 fL Final   MCH 10/04/2021 31.5  26.0 - 34.0 pg Final   MCHC 10/04/2021 32.3  30.0 - 36.0 g/dL Final   RDW 10/04/2021 13.6  11.5 - 15.5 % Final   Platelets 10/04/2021 367  150 - 400 K/uL  Final   nRBC 10/04/2021 0.0  0.0 - 0.2 % Final   Performed at [facility], [place] [address], [place], [place] [address]   Sodium 10/04/2021 138  135 - 145 mmol/L Final   Potassium 10/04/2021 4.4  3.5 - 5.1 mmol/L Final   Chloride 10/04/2021 107  98 - 111 mmol/L Final   CO2 10/04/2021 22  22 - 32 mmol/L Final   Glucose, Bld 10/04/2021 236 (H)  70 - 99 mg/dL Final   Glucose reference range applies only to samples taken after fasting for at least 8 hours.   BUN 10/04/2021 13  8 - 23 mg/dL Final  Creatinine, Ser 10/04/2021 1.15  0.61 - 1.24 mg/dL Final   Calcium 10/04/2021 8.9  8.9 - 10.3 mg/dL Final   GFR, Estimated 10/04/2021 >60  >60 mL/min Final   Comment: (NOTE) Calculated using the CKD-EPI Creatinine Equation (2021)    Anion gap 10/04/2021 9  5 - 15 Final   Performed at [facility], [place] [address] [place], [place] [address]  There may be more visits with results that are not included.    Blood Alcohol level:  Lab Results  Component Value Date   ETH <10 01/20/2022   ETH <10 09/32/6712    Metabolic Disorder Labs: Lab Results  Component Value Date   HGBA1C 10.4 (H) 01/21/2022   MPG 251.78 01/21/2022   MPG >398 02/24/2021   No results found for: "PROLACTIN" Lab Results  Component Value Date   CHOL 213 (H) 05/23/2021   TRIG 115 05/23/2021   HDL 71 05/23/2021   CHOLHDL 3.2 11/03/2019   VLDL 32 (H) 05/03/2015   LDLCALC 122 (H) 05/23/2021   LDLCALC 99 11/03/2019    Therapeutic Lab Levels: No results found for: "LITHIUM" No results found for: "VALPROATE" No results found for: "CBMZ"  Physical Findings   GAD-7    Flowsheet Row Office Visit from 10/08/2021 in [place] Office Visit from 09/05/2021 in [place] [facility] Office Visit from 12/26/2020 in [place] Office Visit from 11/17/2018 in [place] Office Visit from 06/05/2017 in [place]  Total GAD-7 Score _0 0 12      PHQ2-9    [place] [facility] from 01/21/2022 in [facility] Office Visit from 10/08/2021 in [place] Office Visit from 09/05/2021 in [place] [facility] ED from 07/02/2021 in [facility] Office Visit from 12/26/2020 in [place]  PHQ-2 Total Score _1 PHQ-9 Total Score -- _2 Flowsheet Row ED from 01/21/2022 in [facility] ED from 01/20/2022 in [place] ED from 01/18/2022 in [place] CATEGORY Low Risk Low Risk No Risk        Musculoskeletal  Strength & Muscle Tone: within normal limits Gait & Station: normal Patient leans: Front  Psychiatric Specialty Exam  Presentation  General Appearance:  Appropriate for Environment; Casual  Eye Contact: Good  Speech: Clear and Coherent  Speech Volume: Normal  Handedness: Right   Mood and Affect  Mood: Dysphoric  Affect: Appropriate; Congruent   Thought Process  Thought Processes: Linear  Descriptions of Associations:Circumstantial  Orientation:Full (Time, Place and Person)  Thought Content:Logical  Diagnosis of Schizophrenia or Schizoaffective disorder in past: No    Hallucinations:Hallucinations: None  Ideas of Reference:None  Suicidal Thoughts:Suicidal Thoughts: No  Homicidal Thoughts:Homicidal Thoughts: No   Sensorium  Memory: Immediate Good; Recent Good  Judgment: Fair  Insight: Fair   Community education officer  Concentration: Poor  Attention Span: Poor  Recall: Good  Fund of Knowledge: Fair  Language: Fair   Psychomotor Activity  Psychomotor Activity: Psychomotor Activity: Decreased (appears in pain)   Assets  Assets: Communication Skills;  Resilience   Sleep  Sleep: Sleep: Fair   No data recorded  Physical Exam  Physical Exam HENT:     Head:  Atraumatic.  Pulmonary:     Effort: Pulmonary effort is normal.  Skin:    General: Skin is warm and dry.  Neurological:     Mental Status: He is alert.    Review of Systems  Gastrointestinal:        Still very hungry  Musculoskeletal:  Positive for back pain and joint pain.  Psychiatric/Behavioral:  Positive for depression. Negative for hallucinations and suicidal ideas.    Blood pressure 133/77, pulse 83, temperature 98 F (36.7 C), temperature source Oral, resp. rate 16, SpO2 99 %. There is no height or weight on file to calculate BMI.  Treatment Plan Summary: Daily contact with patient to assess and evaluate symptoms and progress in treatment and Medication management  Patient does appear to have heard some of the lesson in therapy yesterday, as he is practicing some of the "radical acceptance" however patient mood is still dysphoric. Patient is able to communicate decent insight into his situation and although he has linear thoughts, he also appears to be trying very hard to take care of his health, once educated.   Patient is still constantly asking for food, but objectively this appears slightly better from yesterday, where patient is occasionally able to talk to staff and other patient's about things other than food. Patient is trying the higher protein snacks as instructed.   Cocaine use d/o  Tobacco use d/o  Action stage. Encouraged cessation Comfort PRNs    Substance induced mood d/o Continued home Cymbalta 60 mg daily  Chronic Pain -Continue gabapentin 633m TID - Continue PRN Tylenol   DM2-insulin-dependent with neuropathy, IMPROVING Urge Incontinence A1c 10.4 (01/21/2022), from 9.7 earlier this year. Goal CBGs <180. <180 the last 48h+.  In regards to urge incontinence patient has been able to make it to bathroom every time during this  stay. Continued home metformin 1000 mg twice daily Continued home glipizide 10 mg with breakfast Continued home empagliflozin 10 mg with breakfast Continued home insulin Long-acting 24 units daily Sensitive sliding scale Continued Gabapentin 600 mg TID and Cymbalta per above for neuropathy Educated on self foot exam twice daily Continue insulin self-administration and SSI     Increased appetite Hx of GERD - Continue pepcid 120mBID  Considerations for follow-up: Recommend high risk screening every 6-12 months with HIV, RPR, hepatitis panel PCP for insulin dependent DM2 - uncontrolled   DISPO: Tentative date: 11/21 [place] [facility] [place] due to [place] dependency       PGY-3 JaFreida BusmanMD 01/27/2022 11:42 AM

## 2022-01-27 NOTE — ED Notes (Signed)
Pt sleeping in no acute distress. RR even and unlabored. Environment secured. Will continue to monitor for safety. 

## 2022-01-27 NOTE — ED Notes (Signed)
Pt requested for trazodone to help him sleep. Trazodone was administered by the Probation officer as requested.

## 2022-01-27 NOTE — ED Notes (Signed)
Patient A&Ox4. Denies intent to harm self/others when asked. Denies A/VH. Patient c/o back ache 5/10. Tylenol given for sx relief. Support and encouragement provided. Routine safety checks conducted according to facility protocol. Encouraged patient to notify staff if thoughts of harm toward self or others arise. Patient verbalize understanding and agreement. Will continue to monitor for safety.

## 2022-01-27 NOTE — ED Notes (Signed)
Pt is in his room resting in bed. Pt denies SI/HI/AVH. No acute distress noted. Will continue to monitor for safety. 

## 2022-01-27 NOTE — ED Notes (Signed)
Patient continues to rest with no sxs of distress noted - will continue to monitor for safety

## 2022-01-27 NOTE — ED Notes (Signed)
Pt is sleeping. No distress noted. Will continue to monitor safety. 

## 2022-01-28 ENCOUNTER — Telehealth: Payer: Self-pay | Admitting: Emergency Medicine

## 2022-01-28 DIAGNOSIS — E785 Hyperlipidemia, unspecified: Secondary | ICD-10-CM | POA: Diagnosis not present

## 2022-01-28 DIAGNOSIS — F1410 Cocaine abuse, uncomplicated: Secondary | ICD-10-CM | POA: Diagnosis not present

## 2022-01-28 DIAGNOSIS — I10 Essential (primary) hypertension: Secondary | ICD-10-CM | POA: Diagnosis not present

## 2022-01-28 DIAGNOSIS — F1420 Cocaine dependence, uncomplicated: Secondary | ICD-10-CM | POA: Diagnosis not present

## 2022-01-28 DIAGNOSIS — Z720 Tobacco use: Secondary | ICD-10-CM | POA: Diagnosis not present

## 2022-01-28 LAB — GLUCOSE, CAPILLARY
Glucose-Capillary: 175 mg/dL — ABNORMAL HIGH (ref 70–99)
Glucose-Capillary: 198 mg/dL — ABNORMAL HIGH (ref 70–99)
Glucose-Capillary: 186 mg/dL — ABNORMAL HIGH (ref 70–99)
Glucose-Capillary: 198 mg/dL — ABNORMAL HIGH (ref 70–99)

## 2022-01-28 MED ORDER — INSULIN ASPART 100 UNIT/ML IJ SOLN
3.0000 [IU] | Freq: Two times a day (BID) | INTRAMUSCULAR | Status: DC
  Administered 2022-01-28 – 2022-02-05 (×18): 3 [IU] via SUBCUTANEOUS

## 2022-01-28 NOTE — Telephone Encounter (Signed)
Copied from [place] [phone]. Topic: General - Other >> Jan 28, 2022  9:56 AM Leitha Schuller wrote: Pt requesting a cb from provider regarding medication prescribed at current rehab facility  Phone [phone]

## 2022-01-28 NOTE — ED Notes (Signed)
Patient A&Ox4. Denies intent to harm self/others when asked. Denies A/VH. Patient denies any physical complaints when asked. No acute distress noted. Education provided on proper diabetic diet as pt continue to be noncompliant with diet restrictions. Pt verbalized understanding of education provided. Support and encouragement provided. Routine safety checks conducted according to facility protocol. Encouraged patient to notify staff if thoughts of harm toward self or others arise. Patient verbalize understanding and agreement. Will continue to monitor for safety.

## 2022-01-28 NOTE — ED Notes (Signed)
Pt walks fine and get around unit without any assistance.

## 2022-01-28 NOTE — ED Notes (Signed)
Pt sleeping@this time. Breathing even and unlabored. Will continue to monitor for safety 

## 2022-01-28 NOTE — Telephone Encounter (Signed)
Patient has requested a callback from [facility] via [place], contacted patient back and he is wanting PCP to contact him, would not explain what his issue is other than the has been living in the streets and is bettering himself..patient currently at [facility] and demanding Dr. Margarita Rana to call him because he has a question to ask her, would not disclose what the question is and patient wants an appointment, when attempting to schedule patient he got upset and stated "No he must not be that important to her because she has yet to call him and I want her to call me 1st and for her to make me and appointment" informed patient that his previous calls from today have been sent forward to the medical staff and that the medical staff busy seeing patient's that have appointments' today and that he would have to wait until Dr. Margarita Rana was available for her to call him back. See Telephone encounters from today.

## 2022-01-28 NOTE — ED Notes (Signed)
Pt is in the room resting. Respirations are even and unlabored. No acute distress noted. Will continue to monitor for safety.

## 2022-01-28 NOTE — ED Notes (Signed)
Patient states he has not been able to sleep - has been resting with eyes closed - had prn trazodone - provider consulted

## 2022-01-28 NOTE — Discharge Planning (Signed)
LCSW received phone call from [place] at [facility] in [place], [place]. Per [name], patient has been accepted into their program for further treatment. [name] reports that the medical director requested that the patient have a handicap room, so per request, patient may not be able to admit until next week after the holiday. LCSW went and spoke with patient and RN who reports the patient would likely be fine without a handicap room. Update was provided to [facility] who reports if the patient does not require a handicap room then they may be able to admit on Wednesday 11/22. Per [name], she will follow up in the morning regarding decision once update has been received. LCSW clarified if it was safe to inform patient of acceptance and [name] confirmed.   LCSW went and spoke with patient to inform of acceptance. Patient was appreciative of LCSW assistance with the discharge process and finding him somewhere safe to go. LCSW provided supportive counseling to the patient and informed him that LCSW is awaiting confirmation on when the patient can admit to the facility. Patient expressed understanding. No other needs were reported at this time by patient.   MD and RN provided update. LCSW will continue to follow and provide updates as received.   Lucius Conn, LCSW Clinical Social Worker [facility] Ph: [phone]

## 2022-01-28 NOTE — Telephone Encounter (Signed)
Copied from [place] [phone]. Topic: General - Other >> Jan 28, 2022  1:15 PM Sabas Sous wrote: Reason for CRM: Pt called requesting for PCP to call this number and ask for Nathanyal Ashmead, best contact: [phone]   Declined to state why, does not want appt just a phone call

## 2022-01-28 NOTE — Telephone Encounter (Signed)
I spoke to the patient on the phone and he stated he needed an appointment with me for the end of December when he gets discharged from rehab.  Thank you for your help.

## 2022-01-28 NOTE — ED Notes (Signed)
Patient's BS down to 67 - shaky and sweaty - OJ and a snack given - provider consulted re: hypoglycemic orders -

## 2022-01-28 NOTE — ED Notes (Signed)
Pt is sleeping. No distress noted. Will continue to monitor safety. 

## 2022-01-28 NOTE — ED Notes (Signed)
No pain or discomfort noted/ reported. Pt alert, oriented, and ambulatory.  Breathing is even and  unlabored.  Will continue to monitor for safety.

## 2022-01-28 NOTE — ED Provider Notes (Addendum)
Behavioral Health Progress Note  Date and Time: 01/28/2022 2:59 PM Name: David Wyatt MRN:  [id number]  Subjective:   David Wyatt is a 61 y.o. male, with PMH cocaine use disorder, tobacco use disorder, housing instability, DM2 + neuropathy, HTN, HLD, no inpatient psych admission, no suicide attempts, who initially presented to [facility] (01/20/2022) for passive SI and assistance with rehab for crack cocaine use. Patient was then IVC by EDP, which was rescinded on 01/21/2022 by Mayra Neer, MD.  He was then admitted Voluntary to [facility] (01/21/2022) via EMS for assistance with residential placement.     Patient reported poor sleep, where he was having difficulties staying asleep. Denied nightmares. Reported needing to get up to urinate about 3 times, no change from previous days. Denied difficulty stopping or starting urination stream. Denied sensation of urinating through a straw. Also endorsed urge incontinence, able to make it to the restroom.   Commended patient for improving diet and keeping CBGs within goal ranges. Did experience 1x hypoglycemic event, relieved with juice.  Patient denied SI/HI/AVH.   On second encounter, patient was having difficulties remembering the "AVH" portion of psych ROS from this AM. Stated that he tried to call his PCP to talk about his insulin regimen because of the needle barrier at [facility], but is amenable to plan per below.  Suicidal Thoughts: No (Last time was 11/12.  Contracted to safety.) Homicidal Thoughts: No Hallucinations: None (Denied AVH)  Mood:  (Fine) Sleep:Poor Appetite: Excessive, slowly normalizing  Diagnosis:  Final diagnoses:  Dermoid cyst of skin of back  Tobacco abuse  Diabetic polyneuropathy associated with type 2 diabetes mellitus (HCC)  Cocaine use disorder, severe, dependence ([place])    Total Time spent with patient: 30 minutes  Past Psychiatric History: See H&P Past Medical History:  Past Medical History:  Diagnosis Date   Angioedema  of lips 07/28/2012   left upper (07/29/2012)   Arthritis    hands and back   Chronic back pain    Cocaine abuse (HCC)    Diabetic neuropathy (HCC)    High cholesterol    Hypertension    Neuropathy    Type II diabetes mellitus ([place])     Past Surgical History:  Procedure Laterality Date   BACK SURGERY     CYSTOSCOPY W/ URETERAL STENT PLACEMENT Right 02/23/2021   Procedure: CYSTOSCOPY WITH RETROGRADE PYELOGRAM/URETERAL STENT PLACEMENT;  Surgeon: Janith Lima, MD;  Location: [facility];  Service: Urology;  Laterality: Right;   HERNIA REPAIR Right 04/01/2012   I & D EXTREMITY Left 12/13/2012   Procedure: IRRIGATION AND DEBRIDEMENT LEFT THUMB;  Surgeon: Tennis Must, MD;  Location: [facility];  Service: Orthopedics;  Laterality: Left;   INCISION AND DRAINAGE OF WOUND     boil on back/notes 07/14/2008 (07/29/2012)   INGUINAL HERNIA REPAIR  04/01/2012   Procedure: HERNIA REPAIR INGUINAL ADULT;  Surgeon: Haywood Lasso, MD;  Location: [facility];  Service: General;  Laterality: Right;   INGUINAL HERNIA REPAIR Left 09/06/2016   Procedure: OPEN REPAIR LEFT INGUINAL HERNIA;  Surgeon: Greer Pickerel, MD;  Location: [facility];  Service: General;  Laterality: Left;   INSERTION OF MESH Left 09/06/2016   Procedure: INSERTION OF MESH;  Surgeon: Greer Pickerel, MD;  Location: [facility] OR;  Service: General;  Laterality: Left;   TONSILLECTOMY     Family History:  Family History  Problem Relation Age of Onset   Diabetes Mother    Kidney disease Mother    Hyperlipidemia Mother  Hyperlipidemia Father    Diabetes Father    Family Psychiatric  History: See H&P Social History:  Social History   Substance and Sexual Activity  Alcohol Use No   Alcohol/week: 0.0 standard drinks of alcohol     Social History   Substance and Sexual Activity  Drug Use Not Currently   Types: "Crack" cocaine    Social History   Socioeconomic History   Marital status: Divorced    Spouse name: Not on file   Number of children: 3   Years  of education: 12   Highest education level: Not on file  Occupational History    Comment: disabled  Tobacco Use   Smoking status: Every Day    Packs/day: 0.25    Years: 30.00    Total pack years: 7.50    Types: Cigarettes   Smokeless tobacco: Former    Quit date: 08/15/2015   Tobacco comments:    04/29/16 2  cigs daily, 10/27/17 sometimes < .25 PPD  Vaping Use   Vaping Use: Never used  Substance and Sexual Activity   Alcohol use: No    Alcohol/week: 0.0 standard drinks of alcohol   Drug use: Not Currently    Types: "Crack" cocaine   Sexual activity: Not on file  Other Topics Concern   Not on file  Social History Narrative   04/29/17   Lives in shelter   Caffeine- a lot of  tea, coffee   Social Determinants of Health   Financial Resource Strain: Not on file  Food Insecurity: Not on file  Transportation Needs: Not on file  Physical Activity: Not on file  Stress: Not on file  Social Connections: Not on file   SDOH:  SDOH Screenings   Depression (PHQ2-9): Low Risk  (01/22/2022)  Tobacco Use: High Risk (01/23/2022)   Additional Social History:                          Current Medications:  Current Facility-Administered Medications  Medication Dose Route Frequency Provider Last Rate Last Admin   acetaminophen (TYLENOL) tablet 650 mg  650 mg Oral Q6H PRN Merrily Brittle, DO   650 mg at 01/27/22 1903   alum & mag hydroxide-simeth (MAALOX/MYLANTA) 200-200-20 MG/5ML suspension 30 mL  30 mL Oral Q4H PRN Merrily Brittle, DO       amLODipine (NORVASC) tablet 10 mg  10 mg Oral Daily Merrily Brittle, DO   10 mg at 01/28/22 0920   atorvastatin (LIPITOR) tablet 40 mg  40 mg Oral Daily Merrily Brittle, DO   40 mg at 01/28/22 0919   DULoxetine (CYMBALTA) DR capsule 60 mg  60 mg Oral Daily Merrily Brittle, DO   60 mg at 01/28/22 0919   empagliflozin (JARDIANCE) tablet 10 mg  10 mg Oral QAC breakfast Merrily Brittle, DO   10 mg at 01/28/22 0900   famotidine (PEPCID) tablet 10 mg  10 mg  Oral BID Damita Dunnings B, MD   10 mg at 01/28/22 0921   feeding supplement (GLUCERNA SHAKE) (GLUCERNA SHAKE) liquid 237 mL  237 mL Oral TID BM Merrily Brittle, DO   237 mL at 01/28/22 1315   gabapentin (NEURONTIN) capsule 600 mg  600 mg Oral TID Merrily Brittle, DO   600 mg at 01/28/22 0920   glipiZIDE (GLUCOTROL XL) 24 hr tablet 10 mg  10 mg Oral Q breakfast Merrily Brittle, DO   10 mg at 01/28/22 0900   insulin aspart (novoLOG) injection  0-6 Units  0-6 Units Subcutaneous TID WC Merrily Brittle, DO   1 Units at 01/28/22 1151   insulin aspart (novoLOG) injection 3 Units  3 Units Subcutaneous BID AC Merrily Brittle, DO   3 Units at 01/28/22 1152   insulin glargine-yfgn (SEMGLEE) injection 24 Units  24 Units Subcutaneous Daily Minda Ditto, RPH   24 Units at 01/28/22 9021   lubriderm seriously sensitive lotion 1 Application  1 Application Topical BID Merrily Brittle, DO   1 Application at 11/55/20 0919   magnesium hydroxide (MILK OF MAGNESIA) suspension 30 mL  30 mL Oral Daily PRN Merrily Brittle, DO       metFORMIN (GLUCOPHAGE) tablet 1,000 mg  1,000 mg Oral BID WC Merrily Brittle, DO   1,000 mg at 01/28/22 0900   nicotine (NICODERM CQ - dosed in mg/24 hours) patch 21 mg  21 mg Transdermal Q0600 Merrily Brittle, DO   21 mg at 01/28/22 0900   nicotine polacrilex (NICORETTE) gum 4 mg  4 mg Oral PRN Merrily Brittle, DO       tamsulosin [facility]) capsule 0.4 mg  0.4 mg Oral Daily Merrily Brittle, DO   0.4 mg at 01/28/22 0919   traZODone (DESYREL) tablet 50 mg  50 mg Oral QHS PRN Evette Georges, NP   50 mg at 01/27/22 2125   Current Outpatient Medications  Medication Sig Dispense Refill   Accu-Chek Softclix Lancets lancets Use up to 4 times daily as directed 100 each 0   amLODipine (NORVASC) 10 MG tablet Take 1 tablet (10 mg total) by mouth daily. 30 tablet 0   atorvastatin (LIPITOR) 40 MG tablet Take 1 tablet (40 mg total) by mouth daily. 30 tablet 0   Blood Glucose Monitoring Suppl (ACCU-CHEK GUIDE) w/Device KIT USE AS  DIRECTED TO CHECK BLOOD SUGARS UP TO TWICE PER DAY 1 kit 0   DULoxetine (CYMBALTA) 60 MG capsule Take 1 capsule (60 mg total) by mouth daily. 30 capsule 0   empagliflozin (JARDIANCE) 10 MG TABS tablet Take 1 tablet (10 mg total) by mouth daily before breakfast. 30 tablet 0   gabapentin (NEURONTIN) 300 MG capsule Take 2 capsules (600 mg total) by mouth 3 (three) times daily. 180 capsule 0   glipiZIDE (GLUCOTROL XL) 10 MG 24 hr tablet Take 1 tablet (10 mg total) by mouth daily with breakfast. 30 tablet 0   glucose blood (ACCU-CHEK GUIDE) test strip Use up to 3 times daily as directed 100 each 3   Insulin Glargine Solostar (LANTUS) 100 UNIT/ML Solostar Pen Inject 24 Units into the skin daily. 15 mL 2   insulin lispro (HUMALOG) 100 UNIT/ML KwikPen Inject 5 Units into the skin 2 (two) times daily before lunch and supper. (Patient not taking: Reported on 01/20/2022) 6 mL 6   Insulin Pen Needle 31G X 8 MM MISC Use with insulin pen 100 each 1   metFORMIN (GLUCOPHAGE) 1000 MG tablet Take 1 tablet (1,000 mg total) by mouth 2 (two) times daily. 60 tablet 0   tamsulosin (FLOMAX) 0.4 MG CAPS capsule Take 1 capsule (0.4 mg total) by mouth daily. For renal calculi 30 capsule 0    Labs  Lab Results:  Admission on 01/21/2022  Component Date Value Ref Range Status   Hgb A1c MFr Bld 01/21/2022 10.4 (H)  4.8 - 5.6 % Final   Comment: (NOTE) Pre diabetes:          5.7%-6.4%  Diabetes:              >  6.4%  Glycemic control for   <7.0% adults with diabetes    Mean Plasma Glucose 01/21/2022 251.78  mg/dL Final   Performed at [facility] [address] [place], [place] [address]   TSH 01/21/2022 0.248 (L)  0.350 - 4.500 uIU/mL Final   Comment: Performed by a 3rd Generation assay with a functional sensitivity of <=0.01 uIU/mL. Performed at [facility], [place] [address] [place], [place] [address]    Glucose-Capillary 01/21/2022 374 (H)  70 - 99 mg/dL Final   Glucose reference range applies only to  samples taken after fasting for at least 8 hours.   Glucose-Capillary 01/21/2022 289 (H)  70 - 99 mg/dL Final   Glucose reference range applies only to samples taken after fasting for at least 8 hours.   Glucose-Capillary 01/22/2022 137 (H)  70 - 99 mg/dL Final   Glucose reference range applies only to samples taken after fasting for at least 8 hours.   Glucose-Capillary 01/22/2022 297 (H)  70 - 99 mg/dL Final   Glucose reference range applies only to samples taken after fasting for at least 8 hours.   T3, Free 01/23/2022 2.2  2.0 - 4.4 pg/mL Final   Comment: (NOTE) Performed At: [facility] [place], [place] [id number] Rush Farmer MD IP:[phone]    Glucose-Capillary 01/22/2022 94  70 - 99 mg/dL Final   Glucose reference range applies only to samples taken after fasting for at least 8 hours.   Free T4 01/23/2022 0.72  0.61 - 1.12 ng/dL Final   Comment: (NOTE) Biotin ingestion may interfere with free T4 tests. If the results are inconsistent with the TSH level, previous test results, or the clinical presentation, then consider biotin interference. If needed, order repeat testing after stopping biotin. Performed at [facility], [place] [address] [place], [place] [address]    TSH 01/23/2022 0.203 (L)  0.350 - 4.500 uIU/mL Final   Comment: Performed by a 3rd Generation assay with a functional sensitivity of <=0.01 uIU/mL. Performed at [facility], [place] [address] [place], [place] [address]    Glucose-Capillary 01/23/2022 132 (H)  70 - 99 mg/dL Final   Glucose reference range applies only to samples taken after fasting for at least 8 hours.   Glucose-Capillary 01/22/2022 59 (L)  70 - 99 mg/dL Final   Glucose reference range applies only to samples taken after fasting for at least 8 hours.   Glucose-Capillary 01/22/2022 177 (H)  70 - 99 mg/dL Final   Glucose reference range applies only to samples taken after fasting for at least 8 hours.    Glucose-Capillary 01/23/2022 284 (H)  70 - 99 mg/dL Final   Glucose reference range applies only to samples taken after fasting for at least 8 hours.   Glucose-Capillary 01/23/2022 188 (H)  70 - 99 mg/dL Final   Glucose reference range applies only to samples taken after fasting for at least 8 hours.   Glucose-Capillary 01/23/2022 294 (H)  70 - 99 mg/dL Final   Glucose reference range applies only to samples taken after fasting for at least 8 hours.   Glucose-Capillary 01/24/2022 180 (H)  70 - 99 mg/dL Final   Glucose reference range applies only to samples taken after fasting for at least 8 hours.   Glucose-Capillary 01/24/2022 275 (H)  70 - 99 mg/dL Final   Glucose reference range applies only to samples taken after fasting for at least 8 hours.   Glucose-Capillary  01/24/2022 146 (H)  70 - 99 mg/dL Final   Glucose reference range applies only to samples taken after fasting for at least 8 hours.   Glucose-Capillary 01/24/2022 137 (H)  70 - 99 mg/dL Final   Glucose reference range applies only to samples taken after fasting for at least 8 hours.   Glucose-Capillary 01/25/2022 201 (H)  70 - 99 mg/dL Final   Glucose reference range applies only to samples taken after fasting for at least 8 hours.   Glucose-Capillary 01/25/2022 195 (H)  70 - 99 mg/dL Final   Glucose reference range applies only to samples taken after fasting for at least 8 hours.   Glucose-Capillary 01/25/2022 149 (H)  70 - 99 mg/dL Final   Glucose reference range applies only to samples taken after fasting for at least 8 hours.   Glucose-Capillary 01/25/2022 147 (H)  70 - 99 mg/dL Final   Glucose reference range applies only to samples taken after fasting for at least 8 hours.   Glucose-Capillary 01/26/2022 123 (H)  70 - 99 mg/dL Final   Glucose reference range applies only to samples taken after fasting for at least 8 hours.   Glucose-Capillary 01/26/2022 176 (H)  70 - 99 mg/dL Final   Glucose reference range applies only to  samples taken after fasting for at least 8 hours.   Glucose-Capillary 01/26/2022 84  70 - 99 mg/dL Final   Glucose reference range applies only to samples taken after fasting for at least 8 hours.   Glucose-Capillary 01/26/2022 177 (H)  70 - 99 mg/dL Final   Glucose reference range applies only to samples taken after fasting for at least 8 hours.   Glucose-Capillary 01/27/2022 150 (H)  70 - 99 mg/dL Final   Glucose reference range applies only to samples taken after fasting for at least 8 hours.   Glucose-Capillary 01/27/2022 171 (H)  70 - 99 mg/dL Final   Glucose reference range applies only to samples taken after fasting for at least 8 hours.   Glucose-Capillary 01/27/2022 159 (H)  70 - 99 mg/dL Final   Glucose reference range applies only to samples taken after fasting for at least 8 hours.   Glucose-Capillary 01/27/2022 67 (L)  70 - 99 mg/dL Final   Glucose reference range applies only to samples taken after fasting for at least 8 hours.   Glucose-Capillary 01/27/2022 107 (H)  70 - 99 mg/dL Final   Glucose reference range applies only to samples taken after fasting for at least 8 hours.   Glucose-Capillary 01/27/2022 144 (H)  70 - 99 mg/dL Final   Glucose reference range applies only to samples taken after fasting for at least 8 hours.   Glucose-Capillary 01/28/2022 175 (H)  70 - 99 mg/dL Final   Glucose reference range applies only to samples taken after fasting for at least 8 hours.   Glucose-Capillary 01/28/2022 198 (H)  70 - 99 mg/dL Final   Glucose reference range applies only to samples taken after fasting for at least 8 hours.  Admission on 01/20/2022, Discharged on 01/21/2022  Component Date Value Ref Range Status   SARS Coronavirus 2 by RT PCR 01/20/2022 NEGATIVE  NEGATIVE Final   Comment: (NOTE) SARS-CoV-2 target nucleic acids are NOT DETECTED.  The SARS-CoV-2 RNA is generally detectable in upper respiratory specimens during the acute phase of infection. The  lowest concentration of SARS-CoV-2 viral copies this assay can detect is 138 copies/mL. A negative result does not preclude SARS-Cov-2 infection and should not  be used as the sole basis for treatment or other patient management decisions. A negative result may occur with  improper specimen collection/handling, submission of specimen other than nasopharyngeal swab, presence of viral mutation(s) within the areas targeted by this assay, and inadequate number of viral copies(<138 copies/mL). A negative result must be combined with clinical observations, patient history, and epidemiological information. The expected result is Negative.  Fact Sheet for Patients:  EntrepreneurPulse.com.au  Fact Sheet for Healthcare Providers:  IncredibleEmployment.be  This test is no                          t yet approved or cleared by the [place] FDA and  has been authorized for detection and/or diagnosis of SARS-CoV-2 by FDA under an Emergency Use Authorization (EUA). This EUA will remain  in effect (meaning this test can be used) for the duration of the COVID-19 declaration under Section 564(b)(1) of the Act, 21 U.S.C.section 360bbb-3(b)(1), unless the authorization is terminated  or revoked sooner.       Influenza A by PCR 01/20/2022 NEGATIVE  NEGATIVE Final   Influenza B by PCR 01/20/2022 NEGATIVE  NEGATIVE Final   Comment: (NOTE) The Xpert Xpress SARS-CoV-2/FLU/RSV plus assay is intended as an aid in the diagnosis of influenza from Nasopharyngeal swab specimens and should not be used as a sole basis for treatment. Nasal washings and aspirates are unacceptable for Xpert Xpress SARS-CoV-2/FLU/RSV testing.  Fact Sheet for Patients: EntrepreneurPulse.com.au  Fact Sheet for Healthcare Providers: IncredibleEmployment.be  This test is not yet approved or cleared by the [place] FDA and has been authorized for  detection and/or diagnosis of SARS-CoV-2 by FDA under an Emergency Use Authorization (EUA). This EUA will remain in effect (meaning this test can be used) for the duration of the COVID-19 declaration under Section 564(b)(1) of the Act, 21 U.S.C. section 360bbb-3(b)(1), unless the authorization is terminated or revoked.  Performed at [facility], [place] [address], [place], [place] [address]    Sodium 01/20/2022 136  135 - 145 mmol/L Final   Potassium 01/20/2022 4.0  3.5 - 5.1 mmol/L Final   Chloride 01/20/2022 103  98 - 111 mmol/L Final   CO2 01/20/2022 24  22 - 32 mmol/L Final   Glucose, Bld 01/20/2022 360 (H)  70 - 99 mg/dL Final   Glucose reference range applies only to samples taken after fasting for at least 8 hours.   BUN 01/20/2022 12  8 - 23 mg/dL Final   Creatinine, Ser 01/20/2022 1.01  0.61 - 1.24 mg/dL Final   Calcium 01/20/2022 9.4  8.9 - 10.3 mg/dL Final   Total Protein 01/20/2022 7.3  6.5 - 8.1 g/dL Final   Albumin 01/20/2022 3.4 (L)  3.5 - 5.0 g/dL Final   AST 01/20/2022 14 (L)  15 - 41 U/L Final   ALT 01/20/2022 10  0 - 44 U/L Final   Alkaline Phosphatase 01/20/2022 101  38 - 126 U/L Final   Total Bilirubin 01/20/2022 0.3  0.3 - 1.2 mg/dL Final   GFR, Estimated 01/20/2022 >60  >60 mL/min Final   Comment: (NOTE) Calculated using the CKD-EPI Creatinine Equation (2021)    Anion gap 01/20/2022 9  5 - 15 Final   Performed at [facility] [address], [place], [place] [address]   Alcohol, Ethyl (B) 01/20/2022 <10  <10 mg/dL Final   Comment: (NOTE) Lowest detectable limit for serum alcohol is 10 mg/dL.  For medical purposes only. Performed at [facility], [place] [address], [place], [place] [address]    Opiates 01/20/2022 NONE DETECTED  NONE DETECTED Final   Cocaine 01/20/2022 POSITIVE (A)  NONE DETECTED Final   Benzodiazepines 01/20/2022 NONE DETECTED  NONE DETECTED Final   Amphetamines 01/20/2022 NONE DETECTED  NONE DETECTED Final    Tetrahydrocannabinol 01/20/2022 NONE DETECTED  NONE DETECTED Final   Barbiturates 01/20/2022 NONE DETECTED  NONE DETECTED Final   Comment: (NOTE) DRUG SCREEN FOR MEDICAL PURPOSES ONLY.  IF CONFIRMATION IS NEEDED FOR ANY PURPOSE, NOTIFY LAB WITHIN 5 DAYS.  LOWEST DETECTABLE LIMITS FOR URINE DRUG SCREEN Drug Class                     Cutoff (ng/mL) Amphetamine and metabolites    1000 Barbiturate and metabolites    200 Benzodiazepine                 200 Opiates and metabolites        300 Cocaine and metabolites        300 THC                            50 Performed at [facility], [place] [address], [place], [place] [address]    WBC 01/20/2022 5.2  4.0 - 10.5 K/uL Final   RBC 01/20/2022 4.73  4.22 - 5.81 MIL/uL Final   Hemoglobin 01/20/2022 14.3  13.0 - 17.0 g/dL Final   HCT 01/20/2022 43.1  39.0 - 52.0 % Final   MCV 01/20/2022 91.1  80.0 - 100.0 fL Final   MCH 01/20/2022 30.2  26.0 - 34.0 pg Final   MCHC 01/20/2022 33.2  30.0 - 36.0 g/dL Final   RDW 01/20/2022 14.1  11.5 - 15.5 % Final   Platelets 01/20/2022 444 (H)  150 - 400 K/uL Final   nRBC 01/20/2022 0.0  0.0 - 0.2 % Final   Neutrophils Relative % 01/20/2022 61  % Final   Neutro Abs 01/20/2022 3.2  1.7 - 7.7 K/uL Final   Lymphocytes Relative 01/20/2022 29  % Final   Lymphs Abs 01/20/2022 1.5  0.7 - 4.0 K/uL Final   Monocytes Relative 01/20/2022 7  % Final   Monocytes Absolute 01/20/2022 0.4  0.1 - 1.0 K/uL Final   Eosinophils Relative 01/20/2022 2  % Final   Eosinophils Absolute 01/20/2022 0.1  0.0 - 0.5 K/uL Final   Basophils Relative 01/20/2022 1  % Final   Basophils Absolute 01/20/2022 0.0  0.0 - 0.1 K/uL Final   Immature Granulocytes 01/20/2022 0  % Final   Abs Immature Granulocytes 01/20/2022 0.01  0.00 - 0.07 K/uL Final   Performed at [facility], [place] [address] [place], [place] [address]   Salicylate Lvl 44/05/4740 <7.0 (L)  7.0 - 30.0 mg/dL Final   Performed at [facility] [address] [place], [place] [address]   Acetaminophen (Tylenol), Serum 01/20/2022 <10 (L)  10 - 30 ug/mL Final   Comment: (NOTE) Therapeutic concentrations vary significantly. A range of 10-30 ug/mL  may be an effective concentration for many patients. However, some  are best treated at concentrations outside of this range. Acetaminophen concentrations >150 ug/mL at 4 hours after ingestion  and >50 ug/mL at 12 hours after ingestion are often associated with  toxic reactions.  Performed at [facility], [place] [address] [place], [place]  [address]    Glucose-Capillary 01/20/2022 344 (H)  70 - 99 mg/dL Final   Glucose reference range applies only to samples taken after fasting for at least 8 hours.   Comment 1 01/20/2022 Notify RN   Final   Comment 2 01/20/2022 Document in Chart   Final   Glucose-Capillary 01/20/2022 479 (H)  70 - 99 mg/dL Final   Glucose reference range applies only to samples taken after fasting for at least 8 hours.   Glucose-Capillary 01/21/2022 243 (H)  70 - 99 mg/dL Final   Glucose reference range applies only to samples taken after fasting for at least 8 hours.   Glucose-Capillary 01/21/2022 224 (H)  70 - 99 mg/dL Final   Glucose reference range applies only to samples taken after fasting for at least 8 hours.   Glucose-Capillary 01/21/2022 282 (H)  70 - 99 mg/dL Final   Glucose reference range applies only to samples taken after fasting for at least 8 hours.  Admission on 01/18/2022, Discharged on 01/19/2022  Component Date Value Ref Range Status   WBC 01/18/2022 5.3  4.0 - 10.5 K/uL Final   RBC 01/18/2022 4.47  4.22 - 5.81 MIL/uL Final   Hemoglobin 01/18/2022 13.7  13.0 - 17.0 g/dL Final   HCT 01/18/2022 41.0  39.0 - 52.0 % Final   MCV 01/18/2022 91.7  80.0 - 100.0 fL Final   MCH 01/18/2022 30.6  26.0 - 34.0 pg Final   MCHC 01/18/2022 33.4  30.0 - 36.0 g/dL Final   RDW 01/18/2022 14.2  11.5 - 15.5 % Final   Platelets 01/18/2022 445 (H)  150 - 400 K/uL Final   nRBC  01/18/2022 0.0  0.0 - 0.2 % Final   Performed at [facility], [place] [address] [place], [place] [address]   Sodium 01/18/2022 136  135 - 145 mmol/L Final   Potassium 01/18/2022 4.3  3.5 - 5.1 mmol/L Final   Chloride 01/18/2022 100  98 - 111 mmol/L Final   CO2 01/18/2022 22  22 - 32 mmol/L Final   Glucose, Bld 01/18/2022 499 (H)  70 - 99 mg/dL Final   Glucose reference range applies only to samples taken after fasting for at least 8 hours.   BUN 01/18/2022 9  8 - 23 mg/dL Final   Creatinine, Ser 01/18/2022 1.23  0.61 - 1.24 mg/dL Final   Calcium 01/18/2022 9.1  8.9 - 10.3 mg/dL Final   Total Protein 01/18/2022 6.6  6.5 - 8.1 g/dL Final   Albumin 01/18/2022 3.2 (L)  3.5 - 5.0 g/dL Final   AST 01/18/2022 18  15 - 41 U/L Final   ALT 01/18/2022 9  0 - 44 U/L Final   Alkaline Phosphatase 01/18/2022 95  38 - 126 U/L Final   Total Bilirubin 01/18/2022 0.3  0.3 - 1.2 mg/dL Final   GFR, Estimated 01/18/2022 >60  >60 mL/min Final   Comment: (NOTE) Calculated using the CKD-EPI Creatinine Equation (2021)    Anion gap 01/18/2022 14  5 - 15 Final   Performed at [facility] [address] [place], [place] [address]   Color, Urine 01/18/2022 YELLOW  YELLOW Final   APPearance 01/18/2022 CLEAR  CLEAR Final   Specific Gravity, Urine 01/18/2022 1.025  1.005 - 1.030 Final   pH 01/18/2022 6.0  5.0 - 8.0 Final   Glucose, UA 01/18/2022 >=500 (A)  NEGATIVE mg/dL Final   Hgb urine dipstick 01/18/2022 NEGATIVE  NEGATIVE Final   Bilirubin Urine 01/18/2022  NEGATIVE  NEGATIVE Final   Ketones, ur 01/18/2022 NEGATIVE  NEGATIVE mg/dL Final   Protein, ur 01/18/2022 NEGATIVE  NEGATIVE mg/dL Final   Nitrite 01/18/2022 NEGATIVE  NEGATIVE Final   Leukocytes,Ua 01/18/2022 SMALL (A)  NEGATIVE Final   RBC / HPF 01/18/2022 0-5  0 - 5 RBC/hpf Final   WBC, UA 01/18/2022 21-50  0 - 5 WBC/hpf Final   Bacteria, UA 01/18/2022 RARE (A)  NONE SEEN Final   Squamous Epithelial / LPF 01/18/2022 0-5  0 - 5 Final    Budding Yeast 01/18/2022 PRESENT   Final   Performed at [facility], [place] [address], [place], [place] [address]   Opiates 01/18/2022 NONE DETECTED  NONE DETECTED Final   Cocaine 01/18/2022 POSITIVE (A)  NONE DETECTED Final   Benzodiazepines 01/18/2022 NONE DETECTED  NONE DETECTED Final   Amphetamines 01/18/2022 NONE DETECTED  NONE DETECTED Final   Tetrahydrocannabinol 01/18/2022 NONE DETECTED  NONE DETECTED Final   Barbiturates 01/18/2022 NONE DETECTED  NONE DETECTED Final   Comment: (NOTE) DRUG SCREEN FOR MEDICAL PURPOSES ONLY.  IF CONFIRMATION IS NEEDED FOR ANY PURPOSE, NOTIFY LAB WITHIN 5 DAYS.  LOWEST DETECTABLE LIMITS FOR URINE DRUG SCREEN Drug Class                     Cutoff (ng/mL) Amphetamine and metabolites    1000 Barbiturate and metabolites    200 Benzodiazepine                 200 Opiates and metabolites        300 Cocaine and metabolites        300 THC                            50 Performed at [facility], [place] [address] [place],  [address]    Glucose-Capillary 01/18/2022 483 (H)  70 - 99 mg/dL Final   Glucose reference range applies only to samples taken after fasting for at least 8 hours.   Glucose-Capillary 01/19/2022 586 (HH)  70 - 99 mg/dL Final   Glucose reference range applies only to samples taken after fasting for at least 8 hours.   Glucose-Capillary 01/19/2022 456 (H)  70 - 99 mg/dL Final   Glucose reference range applies only to samples taken after fasting for at least 8 hours.   Glucose-Capillary 01/19/2022 515 (HH)  70 - 99 mg/dL Final   Glucose reference range applies only to samples taken after fasting for at least 8 hours.   Comment 1 01/19/2022 Notify RN   Final   Glucose-Capillary 01/19/2022 164 (H)  70 - 99 mg/dL Final   Glucose reference range applies only to samples taken after fasting for at least 8 hours.  Admission on 01/07/2022, Discharged on 01/07/2022  Component Date Value Ref Range Status   Glucose-Capillary  01/07/2022 267 (H)  70 - 99 mg/dL Final   Glucose reference range applies only to samples taken after fasting for at least 8 hours.  Admission on 01/02/2022, Discharged on 01/02/2022  Component Date Value Ref Range Status   WBC 01/02/2022 7.0  4.0 - 10.5 K/uL Final   RBC 01/02/2022 4.54  4.22 - 5.81 MIL/uL Final   Hemoglobin 01/02/2022 13.9  13.0 - 17.0 g/dL Final   HCT 01/02/2022 41.4  39.0 - 52.0 % Final   MCV 01/02/2022 91.2  80.0 - 100.0 fL Final   MCH 01/02/2022 30.6  26.0 - 34.0 pg Final   MCHC 01/02/2022 33.6  30.0 - 36.0 g/dL Final   RDW 01/02/2022 14.1  11.5 - 15.5 % Final   Platelets 01/02/2022 369  150 - 400 K/uL Final   nRBC 01/02/2022 0.0  0.0 - 0.2 % Final   Neutrophils Relative % 01/02/2022 59  % Final   Neutro Abs 01/02/2022 4.1  1.7 - 7.7 K/uL Final   Lymphocytes Relative 01/02/2022 28  % Final   Lymphs Abs 01/02/2022 1.9  0.7 - 4.0 K/uL Final   Monocytes Relative 01/02/2022 8  % Final   Monocytes Absolute 01/02/2022 0.5  0.1 - 1.0 K/uL Final   Eosinophils Relative 01/02/2022 4  % Final   Eosinophils Absolute 01/02/2022 0.3  0.0 - 0.5 K/uL Final   Basophils Relative 01/02/2022 1  % Final   Basophils Absolute 01/02/2022 0.1  0.0 - 0.1 K/uL Final   Immature Granulocytes 01/02/2022 0  % Final   Abs Immature Granulocytes 01/02/2022 0.02  0.00 - 0.07 K/uL Final   Performed at [facility], [place] [address], [place], [place] [address]   Sodium 01/02/2022 138  135 - 145 mmol/L Final   Potassium 01/02/2022 4.1  3.5 - 5.1 mmol/L Final   Chloride 01/02/2022 104  98 - 111 mmol/L Final   CO2 01/02/2022 25  22 - 32 mmol/L Final   Glucose, Bld 01/02/2022 216 (H)  70 - 99 mg/dL Final   Glucose reference range applies only to samples taken after fasting for at least 8 hours.   BUN 01/02/2022 11  8 - 23 mg/dL Final   Creatinine, Ser 01/02/2022 0.98  0.61 - 1.24 mg/dL Final   Calcium 01/02/2022 9.5  8.9 - 10.3 mg/dL Final   Total Protein 01/02/2022 7.4  6.5 - 8.1 g/dL Final    Albumin 01/02/2022 3.4 (L)  3.5 - 5.0 g/dL Final   AST 01/02/2022 12 (L)  15 - 41 U/L Final   ALT 01/02/2022 11  0 - 44 U/L Final   Alkaline Phosphatase 01/02/2022 90  38 - 126 U/L Final   Total Bilirubin 01/02/2022 0.9  0.3 - 1.2 mg/dL Final   GFR, Estimated 01/02/2022 >60  >60 mL/min Final   Comment: (NOTE) Calculated using the CKD-EPI Creatinine Equation (2021)    Anion gap 01/02/2022 9  5 - 15 Final   Performed at [facility] [address] [place], [place] [address]  [facility] on 11/01/2021  Component Date Value Ref Range Status   POC Glucose 11/27/2021 301 (A)  70 - 99 mg/dl Final   1 hr after lunch, had not taken insulin  Admission on 10/13/2021, Discharged on 10/15/2021  Component Date Value Ref Range Status   Glucose-Capillary 10/13/2021 388 (H)  70 - 99 mg/dL Final   Glucose reference range applies only to samples taken after fasting for at least 8 hours.   SARS Coronavirus 2 by RT PCR 10/13/2021 NEGATIVE  NEGATIVE Final   Comment: (NOTE) SARS-CoV-2 target nucleic acids are NOT DETECTED.  The SARS-CoV-2 RNA is generally detectable in upper respiratory specimens during the acute phase of infection. The lowest concentration of SARS-CoV-2 viral copies this assay can detect is 138 copies/mL. A negative result does not preclude SARS-Cov-2 infection and should not be used as the sole basis for treatment or other patient management decisions. A negative result may occur with  improper specimen collection/handling, submission of specimen other than nasopharyngeal swab, presence of viral mutation(s) within  the areas targeted by this assay, and inadequate number of viral copies(<138 copies/mL). A negative result must be combined with clinical observations, patient history, and epidemiological information. The expected result is Negative.  Fact Sheet for Patients:  EntrepreneurPulse.com.au  Fact Sheet for Healthcare Providers:   IncredibleEmployment.be  This test is no                          t yet approved or cleared by the [place] FDA and  has been authorized for detection and/or diagnosis of SARS-CoV-2 by FDA under an Emergency Use Authorization (EUA). This EUA will remain  in effect (meaning this test can be used) for the duration of the COVID-19 declaration under Section 564(b)(1) of the Act, 21 U.S.C.section 360bbb-3(b)(1), unless the authorization is terminated  or revoked sooner.       Influenza A by PCR 10/13/2021 NEGATIVE  NEGATIVE Final   Influenza B by PCR 10/13/2021 NEGATIVE  NEGATIVE Final   Comment: (NOTE) The Xpert Xpress SARS-CoV-2/FLU/RSV plus assay is intended as an aid in the diagnosis of influenza from Nasopharyngeal swab specimens and should not be used as a sole basis for treatment. Nasal washings and aspirates are unacceptable for Xpert Xpress SARS-CoV-2/FLU/RSV testing.  Fact Sheet for Patients: EntrepreneurPulse.com.au  Fact Sheet for Healthcare Providers: IncredibleEmployment.be  This test is not yet approved or cleared by the [place] FDA and has been authorized for detection and/or diagnosis of SARS-CoV-2 by FDA under an Emergency Use Authorization (EUA). This EUA will remain in effect (meaning this test can be used) for the duration of the COVID-19 declaration under Section 564(b)(1) of the Act, 21 U.S.C. section 360bbb-3(b)(1), unless the authorization is terminated or revoked.  Performed at [facility], [place] [address], [place], [place] [address]    WBC 10/13/2021 9.5  4.0 - 10.5 K/uL Final   RBC 10/13/2021 4.23  4.22 - 5.81 MIL/uL Final   Hemoglobin 10/13/2021 13.0  13.0 - 17.0 g/dL Final   HCT 10/13/2021 40.4  39.0 - 52.0 % Final   MCV 10/13/2021 95.5  80.0 - 100.0 fL Final   MCH 10/13/2021 30.7  26.0 - 34.0 pg Final   MCHC 10/13/2021 32.2  30.0 - 36.0 g/dL Final   RDW 10/13/2021 13.4  11.5  - 15.5 % Final   Platelets 10/13/2021 551 (H)  150 - 400 K/uL Final   nRBC 10/13/2021 0.0  0.0 - 0.2 % Final   Neutrophils Relative % 10/13/2021 68  % Final   Neutro Abs 10/13/2021 6.5  1.7 - 7.7 K/uL Final   Lymphocytes Relative 10/13/2021 18  % Final   Lymphs Abs 10/13/2021 1.7  0.7 - 4.0 K/uL Final   Monocytes Relative 10/13/2021 9  % Final   Monocytes Absolute 10/13/2021 0.8  0.1 - 1.0 K/uL Final   Eosinophils Relative 10/13/2021 3  % Final   Eosinophils Absolute 10/13/2021 0.3  0.0 - 0.5 K/uL Final   Basophils Relative 10/13/2021 1  % Final   Basophils Absolute 10/13/2021 0.1  0.0 - 0.1 K/uL Final   Immature Granulocytes 10/13/2021 1  % Final   Abs Immature Granulocytes 10/13/2021 0.11 (H)  0.00 - 0.07 K/uL Final   Performed at [facility], [place] [address], [place], [place] [address]   Sodium 10/13/2021 136  135 - 145 mmol/L Final   Potassium 10/13/2021 4.3  3.5 - 5.1 mmol/L Final   Chloride 10/13/2021 104  98 - 111  mmol/L Final   CO2 10/13/2021 24  22 - 32 mmol/L Final   Glucose, Bld 10/13/2021 296 (H)  70 - 99 mg/dL Final   Glucose reference range applies only to samples taken after fasting for at least 8 hours.   BUN 10/13/2021 14  8 - 23 mg/dL Final   Creatinine, Ser 10/13/2021 1.15  0.61 - 1.24 mg/dL Final   Calcium 10/13/2021 9.0  8.9 - 10.3 mg/dL Final   Total Protein 10/13/2021 7.5  6.5 - 8.1 g/dL Final   Albumin 10/13/2021 3.0 (L)  3.5 - 5.0 g/dL Final   AST 10/13/2021 13 (L)  15 - 41 U/L Final   ALT 10/13/2021 10  0 - 44 U/L Final   Alkaline Phosphatase 10/13/2021 84  38 - 126 U/L Final   Total Bilirubin 10/13/2021 0.5  0.3 - 1.2 mg/dL Final   GFR, Estimated 10/13/2021 >60  >60 mL/min Final   Comment: (NOTE) Calculated using the CKD-EPI Creatinine Equation (2021)    Anion gap 10/13/2021 8  5 - 15 Final   Performed at [facility], [place] [address], [place], [place] [address]   Color, Urine 10/13/2021 YELLOW  YELLOW Final   APPearance 10/13/2021 CLOUDY  (A)  CLEAR Final   Specific Gravity, Urine 10/13/2021 1.022  1.005 - 1.030 Final   pH 10/13/2021 5.0  5.0 - 8.0 Final   Glucose, UA 10/13/2021 >=500 (A)  NEGATIVE mg/dL Final   Hgb urine dipstick 10/13/2021 LARGE (A)  NEGATIVE Final   Bilirubin Urine 10/13/2021 NEGATIVE  NEGATIVE Final   Ketones, ur 10/13/2021 NEGATIVE  NEGATIVE mg/dL Final   Protein, ur 10/13/2021 30 (A)  NEGATIVE mg/dL Final   Nitrite 10/13/2021 NEGATIVE  NEGATIVE Final   Leukocytes,Ua 10/13/2021 LARGE (A)  NEGATIVE Final   RBC / HPF 10/13/2021 >50 (H)  0 - 5 RBC/hpf Final   WBC, UA 10/13/2021 >50 (H)  0 - 5 WBC/hpf Final   Bacteria, UA 10/13/2021 RARE (A)  NONE SEEN Final   Squamous Epithelial / LPF 10/13/2021 0-5  0 - 5 Final   WBC Clumps 10/13/2021 PRESENT   Final   Mucus 10/13/2021 PRESENT   Final   Budding Yeast 10/13/2021 PRESENT   Final   Non Squamous Epithelial 10/13/2021 0-5 (A)  NONE SEEN Final   Performed at [facility], [place] [address], [place], [place] [address]   pH, Ven 10/13/2021 7.457 (H)  7.25 - 7.43 Final   pCO2, Ven 10/13/2021 38.1 (L)  44 - 60 mmHg Final   pO2, Ven 10/13/2021 174 (H)  32 - 45 mmHg Final   Bicarbonate 10/13/2021 26.9  20.0 - 28.0 mmol/L Final   TCO2 10/13/2021 28  22 - 32 mmol/L Final   O2 Saturation 10/13/2021 100  % Final   Acid-Base Excess 10/13/2021 3.0 (H)  0.0 - 2.0 mmol/L Final   Sodium 10/13/2021 137  135 - 145 mmol/L Final   Potassium 10/13/2021 4.3  3.5 - 5.1 mmol/L Final   Calcium, Ion 10/13/2021 1.10 (L)  1.15 - 1.40 mmol/L Final   HCT 10/13/2021 40.0  39.0 - 52.0 % Final   Hemoglobin 10/13/2021 13.6  13.0 - 17.0 g/dL Final   Sample type 10/13/2021 VENOUS   Final   Opiates 10/13/2021 NONE DETECTED  NONE DETECTED Final   Cocaine 10/13/2021 POSITIVE (A)  NONE DETECTED Final   Benzodiazepines 10/13/2021 NONE DETECTED  NONE DETECTED Final   Amphetamines 10/13/2021 NONE DETECTED  NONE DETECTED Final  Tetrahydrocannabinol 10/13/2021 NONE DETECTED  NONE DETECTED  Final   Barbiturates 10/13/2021 NONE DETECTED  NONE DETECTED Final   Comment: (NOTE) DRUG SCREEN FOR MEDICAL PURPOSES ONLY.  IF CONFIRMATION IS NEEDED FOR ANY PURPOSE, NOTIFY LAB WITHIN 5 DAYS.  LOWEST DETECTABLE LIMITS FOR URINE DRUG SCREEN Drug Class                     Cutoff (ng/mL) Amphetamine and metabolites    1000 Barbiturate and metabolites    200 Benzodiazepine                 937 Tricyclics and metabolites     300 Opiates and metabolites        300 Cocaine and metabolites        300 THC                            50 Performed at [facility], [place] [address] [place], [place] [address]    Adenovirus 10/13/2021 NOT DETECTED  NOT DETECTED Final   Coronavirus 229E 10/13/2021 NOT DETECTED  NOT DETECTED Final   Comment: (NOTE) The Coronavirus on the Respiratory Panel, DOES NOT test for the novel  Coronavirus (2019 nCoV)    Coronavirus HKU1 10/13/2021 NOT DETECTED  NOT DETECTED Final   Coronavirus NL63 10/13/2021 NOT DETECTED  NOT DETECTED Final   Coronavirus OC43 10/13/2021 NOT DETECTED  NOT DETECTED Final   Metapneumovirus 10/13/2021 NOT DETECTED  NOT DETECTED Final   Rhinovirus / Enterovirus 10/13/2021 NOT DETECTED  NOT DETECTED Final   Influenza A 10/13/2021 NOT DETECTED  NOT DETECTED Final   Influenza B 10/13/2021 NOT DETECTED  NOT DETECTED Final   Parainfluenza Virus 1 10/13/2021 NOT DETECTED  NOT DETECTED Final   Parainfluenza Virus 2 10/13/2021 NOT DETECTED  NOT DETECTED Final   Parainfluenza Virus 3 10/13/2021 NOT DETECTED  NOT DETECTED Final   Parainfluenza Virus 4 10/13/2021 NOT DETECTED  NOT DETECTED Final   Respiratory Syncytial Virus 10/13/2021 NOT DETECTED  NOT DETECTED Final   Bordetella pertussis 10/13/2021 NOT DETECTED  NOT DETECTED Final   Bordetella Parapertussis 10/13/2021 NOT DETECTED  NOT DETECTED Final   Chlamydophila pneumoniae 10/13/2021 NOT DETECTED  NOT DETECTED Final   Mycoplasma pneumoniae 10/13/2021 NOT DETECTED  NOT DETECTED Final    Performed at [facility], [place] [address], [place], [place] [address]   Procalcitonin 10/13/2021 <0.10  ng/mL Final   Comment:        Interpretation: PCT (Procalcitonin) <= 0.5 ng/mL: Systemic infection (sepsis) is not likely. Local bacterial infection is possible. (NOTE)       Sepsis PCT Algorithm           Lower Respiratory Tract                                      Infection PCT Algorithm    ----------------------------     ----------------------------         PCT < 0.25 ng/mL                PCT < 0.10 ng/mL          Strongly encourage             Strongly discourage   discontinuation of antibiotics    initiation of antibiotics    ----------------------------     -----------------------------  PCT 0.25 - 0.50 ng/mL            PCT 0.10 - 0.25 ng/mL               OR       >80% decrease in PCT            Discourage initiation of                                            antibiotics      Encourage discontinuation           of antibiotics    ----------------------------     -----------------------------         PCT >= 0.50 ng/mL              PCT 0.26 - 0.50 ng/mL               AND                                 <80% decrease in PCT             Encourage initiation of                                             antibiotics       Encourage continuation           of antibiotics    ----------------------------     -----------------------------        PCT >= 0.50 ng/mL                  PCT > 0.50 ng/mL               AND         increase in PCT                  Strongly encourage                                      initiation of antibiotics    Strongly encourage escalation           of antibiotics                                     -----------------------------                                           PCT <= 0.25 ng/mL                                                 OR                                        >  80% decrease in PCT                                       Discontinue / Do not initiate                                             antibiotics  Performed at [facility], [place] [address], [place], [place] [address]    B Natriuretic Peptide 10/13/2021 23.6  0.0 - 100.0 pg/mL Final   Performed at [facility] [address] [place], [place] [address]   L. pneumophila Serogp 1 Ur Ag 10/13/2021 Negative  Negative Final   Comment: (NOTE) Presumptive negative for L. pneumophila serogroup 1 antigen in urine, suggesting no recent or current infection. Legionnaires' disease cannot be ruled out since other serogroups and species may also cause disease. Performed At: [facility] [place], [place] [id number] Rush Farmer MD [id number]    Source of Sample 10/13/2021 URINE, RANDOM   Final   Performed at [facility], [place] [address], [place], [place] [address]   Strep Pneumo Urinary Antigen 10/13/2021 NEGATIVE  NEGATIVE Final   Comment:        Infection due to S. pneumoniae cannot be absolutely ruled out since the antigen present may be below the detection limit of the test. Performed at [facility], [address], [place], [place] [address]    Sodium 10/14/2021 138  135 - 145 mmol/L Final   Potassium 10/14/2021 4.3  3.5 - 5.1 mmol/L Final   Chloride 10/14/2021 106  98 - 111 mmol/L Final   CO2 10/14/2021 25  22 - 32 mmol/L Final   Glucose, Bld 10/14/2021 286 (H)  70 - 99 mg/dL Final   Glucose reference range applies only to samples taken after fasting for at least 8 hours.   BUN 10/14/2021 14  8 - 23 mg/dL Final   Creatinine, Ser 10/14/2021 0.98  0.61 - 1.24 mg/dL Final   Calcium 10/14/2021 8.9  8.9 - 10.3 mg/dL Final   GFR, Estimated 10/14/2021 >60  >60 mL/min Final   Comment: (NOTE) Calculated using the CKD-EPI Creatinine Equation (2021)    Anion gap 10/14/2021 7  5 - 15 Final   Performed at [facility], [place] [address] [place], [place] [address]   WBC 10/14/2021 9.6  4.0 - 10.5  K/uL Final   RBC 10/14/2021 4.12 (L)  4.22 - 5.81 MIL/uL Final   Hemoglobin 10/14/2021 12.6 (L)  13.0 - 17.0 g/dL Final   HCT 10/14/2021 38.8 (L)  39.0 - 52.0 % Final   MCV 10/14/2021 94.2  80.0 - 100.0 fL Final   MCH 10/14/2021 30.6  26.0 - 34.0 pg Final   MCHC 10/14/2021 32.5  30.0 - 36.0 g/dL Final   RDW 10/14/2021 13.4  11.5 - 15.5 % Final   Platelets 10/14/2021 556 (H)  150 - 400 K/uL Final   nRBC 10/14/2021 0.0  0.0 - 0.2 % Final   Performed at [facility], [place] [address], [place], [place] [address]   Glucose-Capillary 10/13/2021 281 (H)  70 - 99 mg/dL Final   Glucose reference range applies only to samples taken after fasting for at least 8 hours.  Glucose-Capillary 10/13/2021 399 (H)  70 - 99 mg/dL Final   Glucose reference range applies only to samples taken after fasting for at least 8 hours.   Glucose-Capillary 10/14/2021 239 (H)  70 - 99 mg/dL Final   Glucose reference range applies only to samples taken after fasting for at least 8 hours.   Glucose-Capillary 10/14/2021 345 (H)  70 - 99 mg/dL Final   Glucose reference range applies only to samples taken after fasting for at least 8 hours.   Glucose-Capillary 10/14/2021 383 (H)  70 - 99 mg/dL Final   Glucose reference range applies only to samples taken after fasting for at least 8 hours.   Glucose-Capillary 10/15/2021 194 (H)  70 - 99 mg/dL Final   Glucose reference range applies only to samples taken after fasting for at least 8 hours.   Glucose-Capillary 10/15/2021 161 (H)  70 - 99 mg/dL Final   Glucose reference range applies only to samples taken after fasting for at least 8 hours.   Glucose-Capillary 10/15/2021 312 (H)  70 - 99 mg/dL Final   Glucose reference range applies only to samples taken after fasting for at least 8 hours.   Glucose-Capillary 10/14/2021 279 (H)  70 - 99 mg/dL Final   Glucose reference range applies only to samples taken after fasting for at least 8 hours.  Office Visit on 10/08/2021   Component Date Value Ref Range Status   POC Glucose 10/08/2021 321 (A)  70 - 99 mg/dl Final   HbA1c, POC (controlled diabetic ra* 10/08/2021 9.3 (A)  0.0 - 7.0 % Final  Admission on 10/07/2021, Discharged on 10/07/2021  Component Date Value Ref Range Status   Color, Urine 10/07/2021 YELLOW  YELLOW Final   APPearance 10/07/2021 HAZY (A)  CLEAR Final   Specific Gravity, Urine 10/07/2021 1.010  1.005 - 1.030 Final   pH 10/07/2021 5.0  5.0 - 8.0 Final   Glucose, UA 10/07/2021 >=500 (A)  NEGATIVE mg/dL Final   Hgb urine dipstick 10/07/2021 MODERATE (A)  NEGATIVE Final   Bilirubin Urine 10/07/2021 NEGATIVE  NEGATIVE Final   Ketones, ur 10/07/2021 NEGATIVE  NEGATIVE mg/dL Final   Protein, ur 10/07/2021 30 (A)  NEGATIVE mg/dL Final   Nitrite 10/07/2021 NEGATIVE  NEGATIVE Final   Leukocytes,Ua 10/07/2021 LARGE (A)  NEGATIVE Final   RBC / HPF 10/07/2021 21-50  0 - 5 RBC/hpf Final   WBC, UA 10/07/2021 >50 (H)  0 - 5 WBC/hpf Final   Bacteria, UA 10/07/2021 NONE SEEN  NONE SEEN Final   WBC Clumps 10/07/2021 PRESENT   Final   Mucus 10/07/2021 PRESENT   Final   Budding Yeast 10/07/2021 PRESENT   Final   Performed at [facility], [place] [address], [place], [place] [address]   WBC 10/07/2021 8.9  4.0 - 10.5 K/uL Final   RBC 10/07/2021 4.35  4.22 - 5.81 MIL/uL Final   Hemoglobin 10/07/2021 13.7  13.0 - 17.0 g/dL Final   HCT 10/07/2021 41.9  39.0 - 52.0 % Final   MCV 10/07/2021 96.3  80.0 - 100.0 fL Final   MCH 10/07/2021 31.5  26.0 - 34.0 pg Final   MCHC 10/07/2021 32.7  30.0 - 36.0 g/dL Final   RDW 10/07/2021 13.4  11.5 - 15.5 % Final   Platelets 10/07/2021 376  150 - 400 K/uL Final   nRBC 10/07/2021 0.0  0.0 - 0.2 % Final   Neutrophils Relative % 10/07/2021 59  % Final   Neutro Abs 10/07/2021 5.2  1.7 - 7.7 K/uL Final   Lymphocytes Relative 10/07/2021 17  % Final   Lymphs Abs 10/07/2021 1.5  0.7 - 4.0 K/uL Final   Monocytes Relative 10/07/2021 19  % Final   Monocytes  Absolute 10/07/2021 1.7 (H)  0.1 - 1.0 K/uL Final   Eosinophils Relative 10/07/2021 5  % Final   Eosinophils Absolute 10/07/2021 0.5  0.0 - 0.5 K/uL Final   Basophils Relative 10/07/2021 0  % Final   Basophils Absolute 10/07/2021 0.0  0.0 - 0.1 K/uL Final   Immature Granulocytes 10/07/2021 0  % Final   Abs Immature Granulocytes 10/07/2021 0.04  0.00 - 0.07 K/uL Final   Performed at [facility], [place] [address] [place], [place] [address]   Sodium 10/07/2021 141  135 - 145 mmol/L Final   Potassium 10/07/2021 4.0  3.5 - 5.1 mmol/L Final   Chloride 10/07/2021 107  98 - 111 mmol/L Final   CO2 10/07/2021 24  22 - 32 mmol/L Final   Glucose, Bld 10/07/2021 81  70 - 99 mg/dL Final   Glucose reference range applies only to samples taken after fasting for at least 8 hours.   BUN 10/07/2021 14  8 - 23 mg/dL Final   Creatinine, Ser 10/07/2021 1.19  0.61 - 1.24 mg/dL Final   Calcium 10/07/2021 9.4  8.9 - 10.3 mg/dL Final   Total Protein 10/07/2021 7.9  6.5 - 8.1 g/dL Final   Albumin 10/07/2021 3.5  3.5 - 5.0 g/dL Final   AST 10/07/2021 12 (L)  15 - 41 U/L Final   ALT 10/07/2021 10  0 - 44 U/L Final   Alkaline Phosphatase 10/07/2021 73  38 - 126 U/L Final   Total Bilirubin 10/07/2021 0.7  0.3 - 1.2 mg/dL Final   GFR, Estimated 10/07/2021 >60  >60 mL/min Final   Comment: (NOTE) Calculated using the CKD-EPI Creatinine Equation (2021)    Anion gap 10/07/2021 10  5 - 15 Final   Performed at [facility], [place] [address], [place], [place] [address]   Specimen Description 10/07/2021    Final                   Value:URINE, CLEAN CATCH Performed at [facility], [place] [address], [place], [place] [address]    Special Requests 10/07/2021    Final                   Value:NONE Performed at [facility], [place] [address], [place], [place] [address]    Culture 10/07/2021  (A)   Final                   Value:40,000 COLONIES/mL  YEAST 20,000 COLONIES/mL LACTOBACILLUS SPECIES Standardized susceptibility testing for this organism is not available. Performed at [facility], [place] [address], [place], [place] [address]    Report Status 10/07/2021 10/08/2021 FINAL   Final  Admission on 10/04/2021, Discharged on 10/04/2021  Component Date Value Ref Range Status   WBC 10/04/2021 9.3  4.0 - 10.5 K/uL Final   RBC 10/04/2021 4.26  4.22 - 5.81 MIL/uL Final   Hemoglobin 10/04/2021 13.4  13.0 - 17.0 g/dL Final   HCT 10/04/2021 41.5  39.0 - 52.0 % Final   MCV 10/04/2021 97.4  80.0 - 100.0 fL Final   MCH 10/04/2021 31.5  26.0 - 34.0 pg Final   MCHC 10/04/2021 32.3  30.0 - 36.0 g/dL Final  RDW 10/04/2021 13.6  11.5 - 15.5 % Final   Platelets 10/04/2021 367  150 - 400 K/uL Final   nRBC 10/04/2021 0.0  0.0 - 0.2 % Final   Performed at [facility], [place] [address], [place], [place] [address]   Sodium 10/04/2021 138  135 - 145 mmol/L Final   Potassium 10/04/2021 4.4  3.5 - 5.1 mmol/L Final   Chloride 10/04/2021 107  98 - 111 mmol/L Final   CO2 10/04/2021 22  22 - 32 mmol/L Final   Glucose, Bld 10/04/2021 236 (H)  70 - 99 mg/dL Final   Glucose reference range applies only to samples taken after fasting for at least 8 hours.   BUN 10/04/2021 13  8 - 23 mg/dL Final   Creatinine, Ser 10/04/2021 1.15  0.61 - 1.24 mg/dL Final   Calcium 10/04/2021 8.9  8.9 - 10.3 mg/dL Final   GFR, Estimated 10/04/2021 >60  >60 mL/min Final   Comment: (NOTE) Calculated using the CKD-EPI Creatinine Equation (2021)    Anion gap 10/04/2021 9  5 - 15 Final   Performed at [facility], [place] [address] [place], [place] [address]  There may be more visits with results that are not included.    Blood Alcohol level:  Lab Results  Component Value Date   ETH <10 01/20/2022   ETH <10 76/54/6503    Metabolic Disorder Labs: Lab Results  Component Value Date   HGBA1C 10.4 (H) 01/21/2022   MPG 251.78  01/21/2022   MPG >398 02/24/2021   No results found for: "PROLACTIN" Lab Results  Component Value Date   CHOL 213 (H) 05/23/2021   TRIG 115 05/23/2021   HDL 71 05/23/2021   CHOLHDL 3.2 11/03/2019   VLDL 32 (H) 05/03/2015   LDLCALC 122 (H) 05/23/2021   LDLCALC 99 11/03/2019    Therapeutic Lab Levels: No results found for: "LITHIUM" No results found for: "VALPROATE" No results found for: "CBMZ"  Physical Findings   GAD-7    Flowsheet Row Office Visit from 10/08/2021 in [place] Office Visit from 09/05/2021 in [place] [facility] Office Visit from 12/26/2020 in [place] Office Visit from 11/17/2018 in [place] Office Visit from 06/05/2017 in [place]  Total GAD-7 Score _0 0 12      PHQ2-9    [place] [facility] from 01/21/2022 in [facility] Office Visit from 10/08/2021 in [facility] Office Visit from 09/05/2021 in [facility] [place] [facility] ED from 07/02/2021 in [facility] Office Visit from 12/26/2020 in [facility]  PHQ-2 Total Score _1 PHQ-9 Total Score -- _2 Flowsheet Row ED from 01/21/2022 in [facility] ED from 01/20/2022 in [facility] ED from 01/18/2022 in [facility] CATEGORY Low Risk Low Risk No Risk        Musculoskeletal  Strength & Muscle Tone: within normal limits Gait & Station: normal Patient leans: Front  Psychiatric Specialty Exam  Presentation  General Appearance:  Appropriate for Environment; Casual  Eye Contact: Good  Speech: Clear and Coherent  Speech Volume: Normal  Handedness: Right   Mood and Affect  Mood: -- (Fine)  Affect: Appropriate;  Congruent   Thought Process  Thought Processes: Linear  Descriptions of Associations:Circumstantial  Orientation:Full (Time, Place and Person)  Thought Content:Logical  Diagnosis of Schizophrenia or Schizoaffective disorder in past: No    Hallucinations:Hallucinations: None  Ideas of Reference:None  Suicidal Thoughts:Suicidal Thoughts: No  Homicidal Thoughts:Homicidal Thoughts: No   Sensorium  Memory: Immediate Good; Recent Good  Judgment: Fair  Insight: Fair   Community education officer  Concentration: Poor  Attention Span: Poor  Recall: Poor  Fund of Knowledge: Fair  Language: Fair   Psychomotor Activity  Psychomotor Activity: Psychomotor Activity: Decreased (appears in pain)   Assets  Assets: Communication Skills; Resilience   Sleep  Sleep: Sleep: Poor   Physical Exam  Physical Exam HENT:     Head: Atraumatic.  Pulmonary:     Effort: Pulmonary effort is normal.  Skin:    General: Skin is warm and dry.  Neurological:     Mental Status: He is alert.    Review of Systems  Respiratory:  Negative for shortness of breath.   Cardiovascular:  Negative for chest pain.  Gastrointestinal:  Positive for nausea. Negative for abdominal pain.       Still very hungry, subjectively improving  Genitourinary:        Mild urinary incontinence  Musculoskeletal:  Positive for back pain and joint pain.  Neurological:  Negative for dizziness.   Blood pressure 132/86, pulse 97, temperature 98.6 F (37 C), temperature source Oral, resp. rate 16, SpO2 97 %. There is no height or weight on file to calculate BMI.  Treatment Plan Summary: Daily contact with patient to assess and evaluate symptoms and progress in treatment and Medication management  Dreyden Rohrman is a 61 y.o. male, with PMH cocaine use disorder, tobacco use disorder, housing instability, DM2 + neuropathy, HTN, HLD, no inpatient psych admission, no suicide attempts, who initially presented to  [facility] (01/20/2022) for passive SI and assistance with rehab for crack cocaine use.  Patient was then IVC by EDP, which was rescinded on 01/21/2022 by Mayra Neer, MD.  He was then admitted [facility]untary to [facility] (01/21/2022) via EMS for assistance with residential placement.      Cocaine use d/o  Tobacco use d/o  Action stage. Encouraged cessation Comfort PRNs    Substance induced mood d/o Continued home Cymbalta 60 mg daily  Chronic Pain - Continue gabapentin 671m TID - Continue PRN Tylenol   DM2-insulin-dependent with neuropathy, IMPROVING Urge Incontinence A1c 10.4 (01/21/2022), from 9.7 earlier this year. Goal CBGs <180. <180 the last 48h+, but had 1 episode of symptomatic hypoglycemia, relieved with juice. After educating on uncontrolled DM2, patient continues to work diligently on diet, self administering insulin and foot exam. In regards to urge incontinence patient has been able to make it to bathroom every time during this stay. Continued home metformin 1000 mg twice daily Continued home glipizide 10 mg with breakfast Continued home empagliflozin 10 mg with breakfast Continued home long acting insulin, self administered - 24 units daily  Continued sensitive sliding scale  Decreased short acting 5 units BID to 3 units BID for hypoglycemia per above Continued Gabapentin 600 mg TID and Cymbalta per above for neuropathy Educated on self foot exam twice daily   Increased appetite Hx of GERD Improving, still very hungry Continue pepcid 192mBID  Considerations for follow-up: Recommend high risk screening every 6-12 months with HIV, RPR, hepatitis panel PCP for insulin dependent DM2 - uncontrolled [facility]CP [facility]   DISPO: Tentative date: 11/21 or  11/22 Location: Likely [facility] in [place] - pending call back Unable to go to residential rehab due to insulin dependency     PGY-2 Merrily Brittle, DO 01/28/2022 2:59 PM

## 2022-01-28 NOTE — ED Notes (Signed)
Pt CBG rechecked 198 mg/dL

## 2022-01-29 DIAGNOSIS — F1420 Cocaine dependence, uncomplicated: Secondary | ICD-10-CM | POA: Diagnosis not present

## 2022-01-29 DIAGNOSIS — E785 Hyperlipidemia, unspecified: Secondary | ICD-10-CM | POA: Diagnosis not present

## 2022-01-29 DIAGNOSIS — I10 Essential (primary) hypertension: Secondary | ICD-10-CM | POA: Diagnosis not present

## 2022-01-29 DIAGNOSIS — F1410 Cocaine abuse, uncomplicated: Secondary | ICD-10-CM | POA: Diagnosis not present

## 2022-01-29 DIAGNOSIS — Z720 Tobacco use: Secondary | ICD-10-CM | POA: Diagnosis not present

## 2022-01-29 LAB — GLUCOSE, CAPILLARY
Glucose-Capillary: 111 mg/dL — ABNORMAL HIGH (ref 70–99)
Glucose-Capillary: 160 mg/dL — ABNORMAL HIGH (ref 70–99)
Glucose-Capillary: 209 mg/dL — ABNORMAL HIGH (ref 70–99)
Glucose-Capillary: 155 mg/dL — ABNORMAL HIGH (ref 70–99)

## 2022-01-29 NOTE — ED Provider Notes (Signed)
Patient is complaining of pain to his right great toe; he rate pain at 5/10. He initially reported he might have a foreign object in his right great toe. This NP presented to patient's bedside to assess his foot. On assessment, no open wound, puncture site, redness, signs of infection noted. Patient says his shoe is tight and pain may be related to wearing tight fitting shoes. This NP educated patient on the importance of proper foot care and wearing good fitting shoes. Patient was given tylenol for pain, he rates current pain at 3/10.

## 2022-01-29 NOTE — Progress Notes (Signed)
Patient demanded sleep medication be given to him at 5:30pm.  When he was informed that he could not get sleep meds at that time he became angry, hostile yelling at Probation officer.  Patient was informed he could get sleep meds one hour earlier than ordered.  He stormed off and went to his room.

## 2022-01-29 NOTE — Progress Notes (Signed)
Patient became agitated and angry when told he could only have one snack.  Patient yelling and shouting then went to his room and slammed the door.  RN attempted to speak to him and he began yelling again.  RN was able to calm patient down temporarily.  He then slammed his door again.  MHT called for security who came and spoke to patient.  He was also spoken to by Education officer, museum.  He was informed about what he can eat and when.  Patient is having difficulty understanding what he can eat as part of his diabetic diet.  RN again attempted to educate patient however, his ability to absorb information is limited.  Will continue to attempt to teach patient and work with him to assist him in understanding his dietary limitations.  Patient is calm now and resting in his room.

## 2022-01-29 NOTE — ED Notes (Signed)
Pt is in the bed sleeping. Respirations are even and unlabored. No acute distress noted. Will continue to monitor for safety. 

## 2022-01-29 NOTE — Telephone Encounter (Signed)
See previous message

## 2022-01-29 NOTE — Discharge Planning (Signed)
LCSW followed up with Admissions Coordinator [name] at [facility] regarding update. Per [name], patient will not be able to admit into their facility until next week. Per [name], she was not provided an update regarding room preference for patient, so plan will remain for a handicap room on next week. Patient was provided an update. MD and RN to be provided an update. No other updates were provided at this time.   LCSW spoke with patient about his behavior and response to staff regarding snacks and meals. Patient reported that he was triggered by the response of staff only giving him a bag of chips. LCSW provided brief counseling to the patient regarding restrictions and diet plan due to sugar levels. Patient expressed understanding and stated that he wanted to apologize to staff for the way he responded. LCSW encouraged patient to do so, and to be mindful of how he responds to others. Patient was appreciative of LCSW conversation and update. No other needs to report at this time.   David Conn, LCSW Clinical Social Worker [facility] Ph: [phone]

## 2022-01-29 NOTE — ED Notes (Signed)
Pt sleeping@this time. Breathign even and unlabored. Will continue to monitor for safety 

## 2022-01-29 NOTE — Progress Notes (Signed)
Patient returned to nursing station again demanding sleep meds and now pain meds.  Patient offered and given '650mg'$  tylenol PO PRN now.

## 2022-01-29 NOTE — ED Provider Notes (Addendum)
Behavioral Health Progress Note  Date and Time: 01/29/2022 12:50 PM Name: David Wyatt MRN:  [id number]  Subjective:   David Wyatt is a 62 y.o. male, with PMH cocaine use disorder, tobacco use disorder, housing instability, DM2 + neuropathy, HTN, HLD, no inpatient psych admission, no suicide attempts, who initially presented to [facility] (01/20/2022) for passive SI and assistance with rehab for crack cocaine use. Patient was then IVC by EDP, which was rescinded on 01/21/2022 by Mayra Neer, MD.  He was then admitted Voluntary to [facility] (01/21/2022) via EMS for assistance with residential placement.     Patient reported much better sleep last night and feeling "good" today.  He requested again if he could have half of a muffin. Discussed that he needs to talk to RNs about that. Excessive hunger still present but feels like it is improving. He agreed to not call his PCP 3-4 times yesterday may not have been necessary and that he won't do that again. He had no other questions or concerns at this time. Informed patient of acceptance to [facility] in [place] with be availability next week.   Suicidal Thoughts: No (Contracted to safety) Homicidal Thoughts: No Hallucinations: None (Denied AVH)  Mood:  (good) Sleep:Good Appetite: Excessive, slowly normalizing  Diagnosis:  Final diagnoses:  Dermoid cyst of skin of back  Tobacco abuse  Diabetic polyneuropathy associated with type 2 diabetes mellitus (HCC)  Cocaine use disorder, severe, dependence ([place])    Total Time spent with patient: 30 minutes  Past Psychiatric History: See H&P Past Medical History:  Past Medical History:  Diagnosis Date   Angioedema of lips 07/28/2012   left upper (07/29/2012)   Arthritis    hands and back   Chronic back pain    Cocaine abuse (HCC)    Diabetic neuropathy (HCC)    High cholesterol    Hypertension    Neuropathy    Type II diabetes mellitus ([place])     Past Surgical History:  Procedure Laterality Date   BACK SURGERY      CYSTOSCOPY W/ URETERAL STENT PLACEMENT Right 02/23/2021   Procedure: CYSTOSCOPY WITH RETROGRADE PYELOGRAM/URETERAL STENT PLACEMENT;  Surgeon: Janith Lima, MD;  Location: [facility];  Service: Urology;  Laterality: Right;   HERNIA REPAIR Right 04/01/2012   I & D EXTREMITY Left 12/13/2012   Procedure: IRRIGATION AND DEBRIDEMENT LEFT THUMB;  Surgeon: Tennis Must, MD;  Location: [facility];  Service: Orthopedics;  Laterality: Left;   INCISION AND DRAINAGE OF WOUND     boil on back/notes 07/14/2008 (07/29/2012)   INGUINAL HERNIA REPAIR  04/01/2012   Procedure: HERNIA REPAIR INGUINAL ADULT;  Surgeon: Haywood Lasso, MD;  Location: [facility];  Service: General;  Laterality: Right;   INGUINAL HERNIA REPAIR Left 09/06/2016   Procedure: OPEN REPAIR LEFT INGUINAL HERNIA;  Surgeon: Greer Pickerel, MD;  Location: [facility];  Service: General;  Laterality: Left;   INSERTION OF MESH Left 09/06/2016   Procedure: INSERTION OF MESH;  Surgeon: Greer Pickerel, MD;  Location: [facility];  Service: General;  Laterality: Left;   TONSILLECTOMY     Family History:  Family History  Problem Relation Age of Onset   Diabetes Mother    Kidney disease Mother    Hyperlipidemia Mother    Hyperlipidemia Father    Diabetes Father    Family Psychiatric  History: See H&P Social History:  Social History   Substance and Sexual Activity  Alcohol Use No   Alcohol/week: 0.0 standard drinks of alcohol  Social History   Substance and Sexual Activity  Drug Use Not Currently   Types: "Crack" cocaine    Social History   Socioeconomic History   Marital status: Divorced    Spouse name: Not on file   Number of children: 3   Years of education: 12   Highest education level: Not on file  Occupational History    Comment: disabled  Tobacco Use   Smoking status: Every Day    Packs/day: 0.25    Years: 30.00    Total pack years: 7.50    Types: Cigarettes   Smokeless tobacco: Former    Quit date: 08/15/2015   Tobacco comments:     04/29/16 2  cigs daily, 10/27/17 sometimes < .25 PPD  Vaping Use   Vaping Use: Never used  Substance and Sexual Activity   Alcohol use: No    Alcohol/week: 0.0 standard drinks of alcohol   Drug use: Not Currently    Types: "Crack" cocaine   Sexual activity: Not on file  Other Topics Concern   Not on file  Social History Narrative   04/29/17   Lives in shelter   Caffeine- a lot of  tea, coffee   Social Determinants of Health   Financial Resource Strain: Not on file  Food Insecurity: Not on file  Transportation Needs: Not on file  Physical Activity: Not on file  Stress: Not on file  Social Connections: Not on file   SDOH:  SDOH Screenings   Depression (PHQ2-9): Low Risk  (01/22/2022)  Tobacco Use: High Risk (01/23/2022)   Additional Social History:                          Current Medications:  Current Facility-Administered Medications  Medication Dose Route Frequency Provider Last Rate Last Admin   acetaminophen (TYLENOL) tablet 650 mg  650 mg Oral Q6H PRN Merrily Brittle, DO   650 mg at 01/27/22 1903   alum & mag hydroxide-simeth (MAALOX/MYLANTA) 200-200-20 MG/5ML suspension 30 mL  30 mL Oral Q4H PRN Merrily Brittle, DO       amLODipine (NORVASC) tablet 10 mg  10 mg Oral Daily Merrily Brittle, DO   10 mg at 01/29/22 1019   atorvastatin (LIPITOR) tablet 40 mg  40 mg Oral Daily Merrily Brittle, DO   40 mg at 01/29/22 1019   DULoxetine (CYMBALTA) DR capsule 60 mg  60 mg Oral Daily Merrily Brittle, DO   60 mg at 01/29/22 1018   empagliflozin (JARDIANCE) tablet 10 mg  10 mg Oral QAC breakfast Merrily Brittle, DO   10 mg at 01/29/22 0802   famotidine (PEPCID) tablet 10 mg  10 mg Oral BID Damita Dunnings B, MD   10 mg at 01/29/22 1019   feeding supplement (GLUCERNA SHAKE) (GLUCERNA SHAKE) liquid 237 mL  237 mL Oral TID BM Merrily Brittle, DO   237 mL at 01/29/22 1019   gabapentin (NEURONTIN) capsule 600 mg  600 mg Oral TID Merrily Brittle, DO   600 mg at 01/29/22 1019   glipiZIDE  (GLUCOTROL XL) 24 hr tablet 10 mg  10 mg Oral Q breakfast Merrily Brittle, DO   10 mg at 01/29/22 0802   insulin aspart (novoLOG) injection 0-6 Units  0-6 Units Subcutaneous TID WC Merrily Brittle, DO   1 Units at 01/29/22 1151   insulin aspart (novoLOG) injection 3 Units  3 Units Subcutaneous BID AC Merrily Brittle, DO   3 Units at 01/29/22 1152  insulin glargine-yfgn (SEMGLEE) injection 24 Units  24 Units Subcutaneous Daily Minda Ditto, RPH   24 Units at 01/29/22 1026   lubriderm seriously sensitive lotion 1 Application  1 Application Topical BID Merrily Brittle, DO   1 Application at 33/35/45 1019   magnesium hydroxide (MILK OF MAGNESIA) suspension 30 mL  30 mL Oral Daily PRN Merrily Brittle, DO       metFORMIN (GLUCOPHAGE) tablet 1,000 mg  1,000 mg Oral BID WC Merrily Brittle, DO   1,000 mg at 01/29/22 0802   nicotine (NICODERM CQ - dosed in mg/24 hours) patch 21 mg  21 mg Transdermal Q0600 Merrily Brittle, DO   21 mg at 01/29/22 0630   nicotine polacrilex (NICORETTE) gum 4 mg  4 mg Oral PRN Merrily Brittle, DO       tamsulosin Presance [facility]) capsule 0.4 mg  0.4 mg Oral Daily Merrily Brittle, DO   0.4 mg at 01/29/22 1019   traZODone (DESYREL) tablet 50 mg  50 mg Oral QHS PRN Evette Georges, NP   50 mg at 01/28/22 2100   Current Outpatient Medications  Medication Sig Dispense Refill   Accu-Chek Softclix Lancets lancets Use up to 4 times daily as directed 100 each 0   amLODipine (NORVASC) 10 MG tablet Take 1 tablet (10 mg total) by mouth daily. 30 tablet 0   atorvastatin (LIPITOR) 40 MG tablet Take 1 tablet (40 mg total) by mouth daily. 30 tablet 0   Blood Glucose Monitoring Suppl (ACCU-CHEK GUIDE) w/Device KIT USE AS DIRECTED TO CHECK BLOOD SUGARS UP TO TWICE PER DAY 1 kit 0   DULoxetine (CYMBALTA) 60 MG capsule Take 1 capsule (60 mg total) by mouth daily. 30 capsule 0   empagliflozin (JARDIANCE) 10 MG TABS tablet Take 1 tablet (10 mg total) by mouth daily before breakfast. 30 tablet 0   gabapentin (NEURONTIN) 300  MG capsule Take 2 capsules (600 mg total) by mouth 3 (three) times daily. 180 capsule 0   glipiZIDE (GLUCOTROL XL) 10 MG 24 hr tablet Take 1 tablet (10 mg total) by mouth daily with breakfast. 30 tablet 0   glucose blood (ACCU-CHEK GUIDE) test strip Use up to 3 times daily as directed 100 each 3   Insulin Glargine Solostar (LANTUS) 100 UNIT/ML Solostar Pen Inject 24 Units into the skin daily. 15 mL 2   insulin lispro (HUMALOG) 100 UNIT/ML KwikPen Inject 5 Units into the skin 2 (two) times daily before lunch and supper. (Patient not taking: Reported on 01/20/2022) 6 mL 6   Insulin Pen Needle 31G X 8 MM MISC Use with insulin pen 100 each 1   metFORMIN (GLUCOPHAGE) 1000 MG tablet Take 1 tablet (1,000 mg total) by mouth 2 (two) times daily. 60 tablet 0   tamsulosin (FLOMAX) 0.4 MG CAPS capsule Take 1 capsule (0.4 mg total) by mouth daily. For renal calculi 30 capsule 0    Labs  Lab Results:  Admission on 01/21/2022  Component Date Value Ref Range Status   Hgb A1c MFr Bld 01/21/2022 10.4 (H)  4.8 - 5.6 % Final   Comment: (NOTE) Pre diabetes:          5.7%-6.4%  Diabetes:              >6.4%  Glycemic control for   <7.0% adults with diabetes    Mean Plasma Glucose 01/21/2022 251.78  mg/dL Final   Performed at [facility], [place] [address], [place], [place] [address]   TSH 01/21/2022 0.248 (  L)  0.350 - 4.500 uIU/mL Final   Comment: Performed by a 3rd Generation assay with a functional sensitivity of <=0.01 uIU/mL. Performed at [facility], [place] [address] [place], [place] [address]    Glucose-Capillary 01/21/2022 374 (H)  70 - 99 mg/dL Final   Glucose reference range applies only to samples taken after fasting for at least 8 hours.   Glucose-Capillary 01/21/2022 289 (H)  70 - 99 mg/dL Final   Glucose reference range applies only to samples taken after fasting for at least 8 hours.   Glucose-Capillary 01/22/2022 137 (H)  70 - 99 mg/dL Final   Glucose reference range applies only  to samples taken after fasting for at least 8 hours.   Glucose-Capillary 01/22/2022 297 (H)  70 - 99 mg/dL Final   Glucose reference range applies only to samples taken after fasting for at least 8 hours.   T3, Free 01/23/2022 2.2  2.0 - 4.4 pg/mL Final   Comment: (NOTE) Performed At: [facility] [place], [place] [id number] Rush Farmer MD [id number]    Glucose-Capillary 01/22/2022 94  70 - 99 mg/dL Final   Glucose reference range applies only to samples taken after fasting for at least 8 hours.   Free T4 01/23/2022 0.72  0.61 - 1.12 ng/dL Final   Comment: (NOTE) Biotin ingestion may interfere with free T4 tests. If the results are inconsistent with the TSH level, previous test results, or the clinical presentation, then consider biotin interference. If needed, order repeat testing after stopping biotin. Performed at [facility], [place] [address] [place], [place] [address]    TSH 01/23/2022 0.203 (L)  0.350 - 4.500 uIU/mL Final   Comment: Performed by a 3rd Generation assay with a functional sensitivity of <=0.01 uIU/mL. Performed at [facility], [place] [address] [place], [place] [address]    Glucose-Capillary 01/23/2022 132 (H)  70 - 99 mg/dL Final   Glucose reference range applies only to samples taken after fasting for at least 8 hours.   Glucose-Capillary 01/22/2022 59 (L)  70 - 99 mg/dL Final   Glucose reference range applies only to samples taken after fasting for at least 8 hours.   Glucose-Capillary 01/22/2022 177 (H)  70 - 99 mg/dL Final   Glucose reference range applies only to samples taken after fasting for at least 8 hours.   Glucose-Capillary 01/23/2022 284 (H)  70 - 99 mg/dL Final   Glucose reference range applies only to samples taken after fasting for at least 8 hours.   Glucose-Capillary 01/23/2022 188 (H)  70 - 99 mg/dL Final   Glucose reference range applies only to samples taken after fasting for at least 8 hours.    Glucose-Capillary 01/23/2022 294 (H)  70 - 99 mg/dL Final   Glucose reference range applies only to samples taken after fasting for at least 8 hours.   Glucose-Capillary 01/24/2022 180 (H)  70 - 99 mg/dL Final   Glucose reference range applies only to samples taken after fasting for at least 8 hours.   Glucose-Capillary 01/24/2022 275 (H)  70 - 99 mg/dL Final   Glucose reference range applies only to samples taken after fasting for at least 8 hours.   Glucose-Capillary 01/24/2022 146 (H)  70 - 99 mg/dL Final   Glucose reference range applies only to samples taken after fasting for at least 8 hours.   Glucose-Capillary 01/24/2022 137 (H)  70 - 99 mg/dL Final   Glucose reference  range applies only to samples taken after fasting for at least 8 hours.   Glucose-Capillary 01/25/2022 201 (H)  70 - 99 mg/dL Final   Glucose reference range applies only to samples taken after fasting for at least 8 hours.   Glucose-Capillary 01/25/2022 195 (H)  70 - 99 mg/dL Final   Glucose reference range applies only to samples taken after fasting for at least 8 hours.   Glucose-Capillary 01/25/2022 149 (H)  70 - 99 mg/dL Final   Glucose reference range applies only to samples taken after fasting for at least 8 hours.   Glucose-Capillary 01/25/2022 147 (H)  70 - 99 mg/dL Final   Glucose reference range applies only to samples taken after fasting for at least 8 hours.   Glucose-Capillary 01/26/2022 123 (H)  70 - 99 mg/dL Final   Glucose reference range applies only to samples taken after fasting for at least 8 hours.   Glucose-Capillary 01/26/2022 176 (H)  70 - 99 mg/dL Final   Glucose reference range applies only to samples taken after fasting for at least 8 hours.   Glucose-Capillary 01/26/2022 84  70 - 99 mg/dL Final   Glucose reference range applies only to samples taken after fasting for at least 8 hours.   Glucose-Capillary 01/26/2022 177 (H)  70 - 99 mg/dL Final   Glucose reference range applies only to  samples taken after fasting for at least 8 hours.   Glucose-Capillary 01/27/2022 150 (H)  70 - 99 mg/dL Final   Glucose reference range applies only to samples taken after fasting for at least 8 hours.   Glucose-Capillary 01/27/2022 171 (H)  70 - 99 mg/dL Final   Glucose reference range applies only to samples taken after fasting for at least 8 hours.   Glucose-Capillary 01/27/2022 159 (H)  70 - 99 mg/dL Final   Glucose reference range applies only to samples taken after fasting for at least 8 hours.   Glucose-Capillary 01/27/2022 67 (L)  70 - 99 mg/dL Final   Glucose reference range applies only to samples taken after fasting for at least 8 hours.   Glucose-Capillary 01/27/2022 107 (H)  70 - 99 mg/dL Final   Glucose reference range applies only to samples taken after fasting for at least 8 hours.   Glucose-Capillary 01/27/2022 144 (H)  70 - 99 mg/dL Final   Glucose reference range applies only to samples taken after fasting for at least 8 hours.   Glucose-Capillary 01/28/2022 175 (H)  70 - 99 mg/dL Final   Glucose reference range applies only to samples taken after fasting for at least 8 hours.   Glucose-Capillary 01/28/2022 198 (H)  70 - 99 mg/dL Final   Glucose reference range applies only to samples taken after fasting for at least 8 hours.   Glucose-Capillary 01/28/2022 186 (H)  70 - 99 mg/dL Final   Glucose reference range applies only to samples taken after fasting for at least 8 hours.   Glucose-Capillary 01/28/2022 198 (H)  70 - 99 mg/dL Final   Glucose reference range applies only to samples taken after fasting for at least 8 hours.   Glucose-Capillary 01/29/2022 111 (H)  70 - 99 mg/dL Final   Glucose reference range applies only to samples taken after fasting for at least 8 hours.  Admission on 01/20/2022, Discharged on 01/21/2022  Component Date Value Ref Range Status   SARS Coronavirus 2 by RT PCR 01/20/2022 NEGATIVE  NEGATIVE Final   Comment: (NOTE) SARS-CoV-2 target nucleic  acids  are NOT DETECTED.  The SARS-CoV-2 RNA is generally detectable in upper respiratory specimens during the acute phase of infection. The lowest concentration of SARS-CoV-2 viral copies this assay can detect is 138 copies/mL. A negative result does not preclude SARS-Cov-2 infection and should not be used as the sole basis for treatment or other patient management decisions. A negative result may occur with  improper specimen collection/handling, submission of specimen other than nasopharyngeal swab, presence of viral mutation(s) within the areas targeted by this assay, and inadequate number of viral copies(<138 copies/mL). A negative result must be combined with clinical observations, patient history, and epidemiological information. The expected result is Negative.  Fact Sheet for Patients:  EntrepreneurPulse.com.au  Fact Sheet for Healthcare Providers:  IncredibleEmployment.be  This test is no                          t yet approved or cleared by the [place] FDA and  has been authorized for detection and/or diagnosis of SARS-CoV-2 by FDA under an Emergency Use Authorization (EUA). This EUA will remain  in effect (meaning this test can be used) for the duration of the COVID-19 declaration under Section 564(b)(1) of the Act, 21 U.S.C.section 360bbb-3(b)(1), unless the authorization is terminated  or revoked sooner.       Influenza A by PCR 01/20/2022 NEGATIVE  NEGATIVE Final   Influenza B by PCR 01/20/2022 NEGATIVE  NEGATIVE Final   Comment: (NOTE) The Xpert Xpress SARS-CoV-2/FLU/RSV plus assay is intended as an aid in the diagnosis of influenza from Nasopharyngeal swab specimens and should not be used as a sole basis for treatment. Nasal washings and aspirates are unacceptable for Xpert Xpress SARS-CoV-2/FLU/RSV testing.  Fact Sheet for Patients: EntrepreneurPulse.com.au  Fact Sheet for Healthcare  Providers: IncredibleEmployment.be  This test is not yet approved or cleared by the [place] FDA and has been authorized for detection and/or diagnosis of SARS-CoV-2 by FDA under an Emergency Use Authorization (EUA). This EUA will remain in effect (meaning this test can be used) for the duration of the COVID-19 declaration under Section 564(b)(1) of the Act, 21 U.S.C. section 360bbb-3(b)(1), unless the authorization is terminated or revoked.  Performed at [facility], [place] [address] [place], [place] [address]    Sodium 01/20/2022 136  135 - 145 mmol/L Final   Potassium 01/20/2022 4.0  3.5 - 5.1 mmol/L Final   Chloride 01/20/2022 103  98 - 111 mmol/L Final   CO2 01/20/2022 24  22 - 32 mmol/L Final   Glucose, Bld 01/20/2022 360 (H)  70 - 99 mg/dL Final   Glucose reference range applies only to samples taken after fasting for at least 8 hours.   BUN 01/20/2022 12  8 - 23 mg/dL Final   Creatinine, Ser 01/20/2022 1.01  0.61 - 1.24 mg/dL Final   Calcium 01/20/2022 9.4  8.9 - 10.3 mg/dL Final   Total Protein 01/20/2022 7.3  6.5 - 8.1 g/dL Final   Albumin 01/20/2022 3.4 (L)  3.5 - 5.0 g/dL Final   AST 01/20/2022 14 (L)  15 - 41 U/L Final   ALT 01/20/2022 10  0 - 44 U/L Final   Alkaline Phosphatase 01/20/2022 101  38 - 126 U/L Final   Total Bilirubin 01/20/2022 0.3  0.3 - 1.2 mg/dL Final   GFR, Estimated 01/20/2022 >60  >60 mL/min Final   Comment: (NOTE) Calculated using the CKD-EPI Creatinine Equation (2021)    Anion gap 01/20/2022 9  5 - 15 Final   Performed at [facility], [place] [address] [place], [place] [address]   Alcohol, Ethyl (B) 01/20/2022 <10  <10 mg/dL Final   Comment: (NOTE) Lowest detectable limit for serum alcohol is 10 mg/dL.  For medical purposes only. Performed at [facility], [place] [address] [place], [place] [address]    Opiates 01/20/2022 NONE DETECTED  NONE DETECTED Final   Cocaine 01/20/2022 POSITIVE (A)  NONE  DETECTED Final   Benzodiazepines 01/20/2022 NONE DETECTED  NONE DETECTED Final   Amphetamines 01/20/2022 NONE DETECTED  NONE DETECTED Final   Tetrahydrocannabinol 01/20/2022 NONE DETECTED  NONE DETECTED Final   Barbiturates 01/20/2022 NONE DETECTED  NONE DETECTED Final   Comment: (NOTE) DRUG SCREEN FOR MEDICAL PURPOSES ONLY.  IF CONFIRMATION IS NEEDED FOR ANY PURPOSE, NOTIFY LAB WITHIN 5 DAYS.  LOWEST DETECTABLE LIMITS FOR URINE DRUG SCREEN Drug Class                     Cutoff (ng/mL) Amphetamine and metabolites    1000 Barbiturate and metabolites    200 Benzodiazepine                 200 Opiates and metabolites        300 Cocaine and metabolites        300 THC                            50 Performed at [facility], [place] [address] [place], [place] [address]    WBC 01/20/2022 5.2  4.0 - 10.5 K/uL Final   RBC 01/20/2022 4.73  4.22 - 5.81 MIL/uL Final   Hemoglobin 01/20/2022 14.3  13.0 - 17.0 g/dL Final   HCT 01/20/2022 43.1  39.0 - 52.0 % Final   MCV 01/20/2022 91.1  80.0 - 100.0 fL Final   MCH 01/20/2022 30.2  26.0 - 34.0 pg Final   MCHC 01/20/2022 33.2  30.0 - 36.0 g/dL Final   RDW 01/20/2022 14.1  11.5 - 15.5 % Final   Platelets 01/20/2022 444 (H)  150 - 400 K/uL Final   nRBC 01/20/2022 0.0  0.0 - 0.2 % Final   Neutrophils Relative % 01/20/2022 61  % Final   Neutro Abs 01/20/2022 3.2  1.7 - 7.7 K/uL Final   Lymphocytes Relative 01/20/2022 29  % Final   Lymphs Abs 01/20/2022 1.5  0.7 - 4.0 K/uL Final   Monocytes Relative 01/20/2022 7  % Final   Monocytes Absolute 01/20/2022 0.4  0.1 - 1.0 K/uL Final   Eosinophils Relative 01/20/2022 2  % Final   Eosinophils Absolute 01/20/2022 0.1  0.0 - 0.5 K/uL Final   Basophils Relative 01/20/2022 1  % Final   Basophils Absolute 01/20/2022 0.0  0.0 - 0.1 K/uL Final   Immature Granulocytes 01/20/2022 0  % Final   Abs Immature Granulocytes 01/20/2022 0.01  0.00 - 0.07 K/uL Final   Performed at [facility], [place]  [address] [place], [place] [address]   Salicylate Lvl 51/88/4166 <7.0 (L)  7.0 - 30.0 mg/dL Final   Performed at [place] [address], [place], [place] [address]   Acetaminophen (Tylenol), Serum 01/20/2022 <10 (L)  10 - 30 ug/mL Final   Comment: (NOTE) Therapeutic concentrations vary significantly. A range of 10-30 ug/mL  may be an effective concentration for many patients. However, some  are best treated  at concentrations outside of this range. Acetaminophen concentrations >150 ug/mL at 4 hours after ingestion  and >50 ug/mL at 12 hours after ingestion are often associated with  toxic reactions.  Performed at [facility], [place] [address] [place], [place] [address]    Glucose-Capillary 01/20/2022 344 (H)  70 - 99 mg/dL Final   Glucose reference range applies only to samples taken after fasting for at least 8 hours.   Comment 1 01/20/2022 Notify RN   Final   Comment 2 01/20/2022 Document in Chart   Final   Glucose-Capillary 01/20/2022 479 (H)  70 - 99 mg/dL Final   Glucose reference range applies only to samples taken after fasting for at least 8 hours.   Glucose-Capillary 01/21/2022 243 (H)  70 - 99 mg/dL Final   Glucose reference range applies only to samples taken after fasting for at least 8 hours.   Glucose-Capillary 01/21/2022 224 (H)  70 - 99 mg/dL Final   Glucose reference range applies only to samples taken after fasting for at least 8 hours.   Glucose-Capillary 01/21/2022 282 (H)  70 - 99 mg/dL Final   Glucose reference range applies only to samples taken after fasting for at least 8 hours.  Admission on 01/18/2022, Discharged on 01/19/2022  Component Date Value Ref Range Status   WBC 01/18/2022 5.3  4.0 - 10.5 K/uL Final   RBC 01/18/2022 4.47  4.22 - 5.81 MIL/uL Final   Hemoglobin 01/18/2022 13.7  13.0 - 17.0 g/dL Final   HCT 01/18/2022 41.0  39.0 - 52.0 % Final   MCV 01/18/2022 91.7  80.0 - 100.0 fL Final   MCH 01/18/2022 30.6  26.0 - 34.0 pg Final   MCHC  01/18/2022 33.4  30.0 - 36.0 g/dL Final   RDW 01/18/2022 14.2  11.5 - 15.5 % Final   Platelets 01/18/2022 445 (H)  150 - 400 K/uL Final   nRBC 01/18/2022 0.0  0.0 - 0.2 % Final   Performed at [facility], [place] [address], [place], [place] [address]   Sodium 01/18/2022 136  135 - 145 mmol/L Final   Potassium 01/18/2022 4.3  3.5 - 5.1 mmol/L Final   Chloride 01/18/2022 100  98 - 111 mmol/L Final   CO2 01/18/2022 22  22 - 32 mmol/L Final   Glucose, Bld 01/18/2022 499 (H)  70 - 99 mg/dL Final   Glucose reference range applies only to samples taken after fasting for at least 8 hours.   BUN 01/18/2022 9  8 - 23 mg/dL Final   Creatinine, Ser 01/18/2022 1.23  0.61 - 1.24 mg/dL Final   Calcium 01/18/2022 9.1  8.9 - 10.3 mg/dL Final   Total Protein 01/18/2022 6.6  6.5 - 8.1 g/dL Final   Albumin 01/18/2022 3.2 (L)  3.5 - 5.0 g/dL Final   AST 01/18/2022 18  15 - 41 U/L Final   ALT 01/18/2022 9  0 - 44 U/L Final   Alkaline Phosphatase 01/18/2022 95  38 - 126 U/L Final   Total Bilirubin 01/18/2022 0.3  0.3 - 1.2 mg/dL Final   GFR, Estimated 01/18/2022 >60  >60 mL/min Final   Comment: (NOTE) Calculated using the CKD-EPI Creatinine Equation (2021)    Anion gap 01/18/2022 14  5 - 15 Final   Performed at [facility] [address] [place],  [address]   Color, Urine 01/18/2022 YELLOW  YELLOW Final   APPearance 01/18/2022 CLEAR  CLEAR Final   Specific Gravity,  Urine 01/18/2022 1.025  1.005 - 1.030 Final   pH 01/18/2022 6.0  5.0 - 8.0 Final   Glucose, UA 01/18/2022 >=500 (A)  NEGATIVE mg/dL Final   Hgb urine dipstick 01/18/2022 NEGATIVE  NEGATIVE Final   Bilirubin Urine 01/18/2022 NEGATIVE  NEGATIVE Final   Ketones, ur 01/18/2022 NEGATIVE  NEGATIVE mg/dL Final   Protein, ur 01/18/2022 NEGATIVE  NEGATIVE mg/dL Final   Nitrite 01/18/2022 NEGATIVE  NEGATIVE Final   Leukocytes,Ua 01/18/2022 SMALL (A)  NEGATIVE Final   RBC / HPF 01/18/2022 0-5  0 - 5 RBC/hpf Final   WBC, UA  01/18/2022 21-50  0 - 5 WBC/hpf Final   Bacteria, UA 01/18/2022 RARE (A)  NONE SEEN Final   Squamous Epithelial / LPF 01/18/2022 0-5  0 - 5 Final   Budding Yeast 01/18/2022 PRESENT   Final   Performed at [facility], [place] [address] [place], [place] [address]   Opiates 01/18/2022 NONE DETECTED  NONE DETECTED Final   Cocaine 01/18/2022 POSITIVE (A)  NONE DETECTED Final   Benzodiazepines 01/18/2022 NONE DETECTED  NONE DETECTED Final   Amphetamines 01/18/2022 NONE DETECTED  NONE DETECTED Final   Tetrahydrocannabinol 01/18/2022 NONE DETECTED  NONE DETECTED Final   Barbiturates 01/18/2022 NONE DETECTED  NONE DETECTED Final   Comment: (NOTE) DRUG SCREEN FOR MEDICAL PURPOSES ONLY.  IF CONFIRMATION IS NEEDED FOR ANY PURPOSE, NOTIFY LAB WITHIN 5 DAYS.  LOWEST DETECTABLE LIMITS FOR URINE DRUG SCREEN Drug Class                     Cutoff (ng/mL) Amphetamine and metabolites    1000 Barbiturate and metabolites    200 Benzodiazepine                 200 Opiates and metabolites        300 Cocaine and metabolites        300 THC                            50 Performed at [facility], [place] [address] [place], [place] [address]    Glucose-Capillary 01/18/2022 483 (H)  70 - 99 mg/dL Final   Glucose reference range applies only to samples taken after fasting for at least 8 hours.   Glucose-Capillary 01/19/2022 586 (HH)  70 - 99 mg/dL Final   Glucose reference range applies only to samples taken after fasting for at least 8 hours.   Glucose-Capillary 01/19/2022 456 (H)  70 - 99 mg/dL Final   Glucose reference range applies only to samples taken after fasting for at least 8 hours.   Glucose-Capillary 01/19/2022 515 (HH)  70 - 99 mg/dL Final   Glucose reference range applies only to samples taken after fasting for at least 8 hours.   Comment 1 01/19/2022 Notify RN   Final   Glucose-Capillary 01/19/2022 164 (H)  70 - 99 mg/dL Final   Glucose reference range applies only to samples taken  after fasting for at least 8 hours.  Admission on 01/07/2022, Discharged on 01/07/2022  Component Date Value Ref Range Status   Glucose-Capillary 01/07/2022 267 (H)  70 - 99 mg/dL Final   Glucose reference range applies only to samples taken after fasting for at least 8 hours.  Admission on 01/02/2022, Discharged on 01/02/2022  Component Date Value Ref Range Status   WBC 01/02/2022 7.0  4.0 - 10.5 K/uL Final   RBC 01/02/2022 4.54  4.22 - 5.81 MIL/uL Final   Hemoglobin 01/02/2022 13.9  13.0 - 17.0 g/dL Final   HCT 01/02/2022 41.4  39.0 - 52.0 % Final   MCV 01/02/2022 91.2  80.0 - 100.0 fL Final   MCH 01/02/2022 30.6  26.0 - 34.0 pg Final   MCHC 01/02/2022 33.6  30.0 - 36.0 g/dL Final   RDW 01/02/2022 14.1  11.5 - 15.5 % Final   Platelets 01/02/2022 369  150 - 400 K/uL Final   nRBC 01/02/2022 0.0  0.0 - 0.2 % Final   Neutrophils Relative % 01/02/2022 59  % Final   Neutro Abs 01/02/2022 4.1  1.7 - 7.7 K/uL Final   Lymphocytes Relative 01/02/2022 28  % Final   Lymphs Abs 01/02/2022 1.9  0.7 - 4.0 K/uL Final   Monocytes Relative 01/02/2022 8  % Final   Monocytes Absolute 01/02/2022 0.5  0.1 - 1.0 K/uL Final   Eosinophils Relative 01/02/2022 4  % Final   Eosinophils Absolute 01/02/2022 0.3  0.0 - 0.5 K/uL Final   Basophils Relative 01/02/2022 1  % Final   Basophils Absolute 01/02/2022 0.1  0.0 - 0.1 K/uL Final   Immature Granulocytes 01/02/2022 0  % Final   Abs Immature Granulocytes 01/02/2022 0.02  0.00 - 0.07 K/uL Final   Performed at [facility], [place] [address] [place], [place] [address]   Sodium 01/02/2022 138  135 - 145 mmol/L Final   Potassium 01/02/2022 4.1  3.5 - 5.1 mmol/L Final   Chloride 01/02/2022 104  98 - 111 mmol/L Final   CO2 01/02/2022 25  22 - 32 mmol/L Final   Glucose, Bld 01/02/2022 216 (H)  70 - 99 mg/dL Final   Glucose reference range applies only to samples taken after fasting for at least 8 hours.   BUN 01/02/2022 11  8 - 23 mg/dL Final   Creatinine, Ser  01/02/2022 0.98  0.61 - 1.24 mg/dL Final   Calcium 01/02/2022 9.5  8.9 - 10.3 mg/dL Final   Total Protein 01/02/2022 7.4  6.5 - 8.1 g/dL Final   Albumin 01/02/2022 3.4 (L)  3.5 - 5.0 g/dL Final   AST 01/02/2022 12 (L)  15 - 41 U/L Final   ALT 01/02/2022 11  0 - 44 U/L Final   Alkaline Phosphatase 01/02/2022 90  38 - 126 U/L Final   Total Bilirubin 01/02/2022 0.9  0.3 - 1.2 mg/dL Final   GFR, Estimated 01/02/2022 >60  >60 mL/min Final   Comment: (NOTE) Calculated using the CKD-EPI Creatinine Equation (2021)    Anion gap 01/02/2022 9  5 - 15 Final   Performed at [facility] [address], [place], [place] [address]  [facility] on 11/01/2021  Component Date Value Ref Range Status   POC Glucose 11/27/2021 301 (A)  70 - 99 mg/dl Final   1 hr after lunch, had not taken insulin  Admission on 10/13/2021, Discharged on 10/15/2021  Component Date Value Ref Range Status   Glucose-Capillary 10/13/2021 388 (H)  70 - 99 mg/dL Final   Glucose reference range applies only to samples taken after fasting for at least 8 hours.   SARS Coronavirus 2 by RT PCR 10/13/2021 NEGATIVE  NEGATIVE Final   Comment: (NOTE) SARS-CoV-2 target nucleic acids are NOT DETECTED.  The SARS-CoV-2 RNA is generally detectable in upper respiratory specimens during the acute phase of infection. The lowest concentration of SARS-CoV-2 viral copies this assay can detect is 138 copies/mL. A negative  result does not preclude SARS-Cov-2 infection and should not be used as the sole basis for treatment or other patient management decisions. A negative result may occur with  improper specimen collection/handling, submission of specimen other than nasopharyngeal swab, presence of viral mutation(s) within the areas targeted by this assay, and inadequate number of viral copies(<138 copies/mL). A negative result must be combined with clinical observations, patient history, and epidemiological information. The  expected result is Negative.  Fact Sheet for Patients:  EntrepreneurPulse.com.au  Fact Sheet for Healthcare Providers:  IncredibleEmployment.be  This test is no                          t yet approved or cleared by the [place] FDA and  has been authorized for detection and/or diagnosis of SARS-CoV-2 by FDA under an Emergency Use Authorization (EUA). This EUA will remain  in effect (meaning this test can be used) for the duration of the COVID-19 declaration under Section 564(b)(1) of the Act, 21 U.S.C.section 360bbb-3(b)(1), unless the authorization is terminated  or revoked sooner.       Influenza A by PCR 10/13/2021 NEGATIVE  NEGATIVE Final   Influenza B by PCR 10/13/2021 NEGATIVE  NEGATIVE Final   Comment: (NOTE) The Xpert Xpress SARS-CoV-2/FLU/RSV plus assay is intended as an aid in the diagnosis of influenza from Nasopharyngeal swab specimens and should not be used as a sole basis for treatment. Nasal washings and aspirates are unacceptable for Xpert Xpress SARS-CoV-2/FLU/RSV testing.  Fact Sheet for Patients: EntrepreneurPulse.com.au  Fact Sheet for Healthcare Providers: IncredibleEmployment.be  This test is not yet approved or cleared by the [place] FDA and has been authorized for detection and/or diagnosis of SARS-CoV-2 by FDA under an Emergency Use Authorization (EUA). This EUA will remain in effect (meaning this test can be used) for the duration of the COVID-19 declaration under Section 564(b)(1) of the Act, 21 U.S.C. section 360bbb-3(b)(1), unless the authorization is terminated or revoked.  Performed at [facility], [place] [address] [place], [place] [address]    WBC 10/13/2021 9.5  4.0 - 10.5 K/uL Final   RBC 10/13/2021 4.23  4.22 - 5.81 MIL/uL Final   Hemoglobin 10/13/2021 13.0  13.0 - 17.0 g/dL Final   HCT 10/13/2021 40.4  39.0 - 52.0 % Final   MCV 10/13/2021 95.5   80.0 - 100.0 fL Final   MCH 10/13/2021 30.7  26.0 - 34.0 pg Final   MCHC 10/13/2021 32.2  30.0 - 36.0 g/dL Final   RDW 10/13/2021 13.4  11.5 - 15.5 % Final   Platelets 10/13/2021 551 (H)  150 - 400 K/uL Final   nRBC 10/13/2021 0.0  0.0 - 0.2 % Final   Neutrophils Relative % 10/13/2021 68  % Final   Neutro Abs 10/13/2021 6.5  1.7 - 7.7 K/uL Final   Lymphocytes Relative 10/13/2021 18  % Final   Lymphs Abs 10/13/2021 1.7  0.7 - 4.0 K/uL Final   Monocytes Relative 10/13/2021 9  % Final   Monocytes Absolute 10/13/2021 0.8  0.1 - 1.0 K/uL Final   Eosinophils Relative 10/13/2021 3  % Final   Eosinophils Absolute 10/13/2021 0.3  0.0 - 0.5 K/uL Final   Basophils Relative 10/13/2021 1  % Final   Basophils Absolute 10/13/2021 0.1  0.0 - 0.1 K/uL Final   Immature Granulocytes 10/13/2021 1  % Final   Abs Immature Granulocytes 10/13/2021 0.11 (H)  0.00 - 0.07 K/uL Final  Performed at [facility], [place] [address] [place], [place] [address]   Sodium 10/13/2021 136  135 - 145 mmol/L Final   Potassium 10/13/2021 4.3  3.5 - 5.1 mmol/L Final   Chloride 10/13/2021 104  98 - 111 mmol/L Final   CO2 10/13/2021 24  22 - 32 mmol/L Final   Glucose, Bld 10/13/2021 296 (H)  70 - 99 mg/dL Final   Glucose reference range applies only to samples taken after fasting for at least 8 hours.   BUN 10/13/2021 14  8 - 23 mg/dL Final   Creatinine, Ser 10/13/2021 1.15  0.61 - 1.24 mg/dL Final   Calcium 10/13/2021 9.0  8.9 - 10.3 mg/dL Final   Total Protein 10/13/2021 7.5  6.5 - 8.1 g/dL Final   Albumin 10/13/2021 3.0 (L)  3.5 - 5.0 g/dL Final   AST 10/13/2021 13 (L)  15 - 41 U/L Final   ALT 10/13/2021 10  0 - 44 U/L Final   Alkaline Phosphatase 10/13/2021 84  38 - 126 U/L Final   Total Bilirubin 10/13/2021 0.5  0.3 - 1.2 mg/dL Final   GFR, Estimated 10/13/2021 >60  >60 mL/min Final   Comment: (NOTE) Calculated using the CKD-EPI Creatinine Equation (2021)    Anion gap 10/13/2021 8  5 - 15 Final   Performed at  [facility], [place] [address] [place], [place] [address]   Color, Urine 10/13/2021 YELLOW  YELLOW Final   APPearance 10/13/2021 CLOUDY (A)  CLEAR Final   Specific Gravity, Urine 10/13/2021 1.022  1.005 - 1.030 Final   pH 10/13/2021 5.0  5.0 - 8.0 Final   Glucose, UA 10/13/2021 >=500 (A)  NEGATIVE mg/dL Final   Hgb urine dipstick 10/13/2021 LARGE (A)  NEGATIVE Final   Bilirubin Urine 10/13/2021 NEGATIVE  NEGATIVE Final   Ketones, ur 10/13/2021 NEGATIVE  NEGATIVE mg/dL Final   Protein, ur 10/13/2021 30 (A)  NEGATIVE mg/dL Final   Nitrite 10/13/2021 NEGATIVE  NEGATIVE Final   Leukocytes,Ua 10/13/2021 LARGE (A)  NEGATIVE Final   RBC / HPF 10/13/2021 >50 (H)  0 - 5 RBC/hpf Final   WBC, UA 10/13/2021 >50 (H)  0 - 5 WBC/hpf Final   Bacteria, UA 10/13/2021 RARE (A)  NONE SEEN Final   Squamous Epithelial / LPF 10/13/2021 0-5  0 - 5 Final   WBC Clumps 10/13/2021 PRESENT   Final   Mucus 10/13/2021 PRESENT   Final   Budding Yeast 10/13/2021 PRESENT   Final   Non Squamous Epithelial 10/13/2021 0-5 (A)  NONE SEEN Final   Performed at [facility], [place] [address], [place], [place] [address]   pH, Ven 10/13/2021 7.457 (H)  7.25 - 7.43 Final   pCO2, Ven 10/13/2021 38.1 (L)  44 - 60 mmHg Final   pO2, Ven 10/13/2021 174 (H)  32 - 45 mmHg Final   Bicarbonate 10/13/2021 26.9  20.0 - 28.0 mmol/L Final   TCO2 10/13/2021 28  22 - 32 mmol/L Final   O2 Saturation 10/13/2021 100  % Final   Acid-Base Excess 10/13/2021 3.0 (H)  0.0 - 2.0 mmol/L Final   Sodium 10/13/2021 137  135 - 145 mmol/L Final   Potassium 10/13/2021 4.3  3.5 - 5.1 mmol/L Final   Calcium, Ion 10/13/2021 1.10 (L)  1.15 - 1.40 mmol/L Final   HCT 10/13/2021 40.0  39.0 - 52.0 % Final   Hemoglobin 10/13/2021 13.6  13.0 - 17.0 g/dL Final   Sample type 10/13/2021 VENOUS  Final   Opiates 10/13/2021 NONE DETECTED  NONE DETECTED Final   Cocaine 10/13/2021 POSITIVE (A)  NONE DETECTED Final   Benzodiazepines 10/13/2021 NONE DETECTED  NONE  DETECTED Final   Amphetamines 10/13/2021 NONE DETECTED  NONE DETECTED Final   Tetrahydrocannabinol 10/13/2021 NONE DETECTED  NONE DETECTED Final   Barbiturates 10/13/2021 NONE DETECTED  NONE DETECTED Final   Comment: (NOTE) DRUG SCREEN FOR MEDICAL PURPOSES ONLY.  IF CONFIRMATION IS NEEDED FOR ANY PURPOSE, NOTIFY LAB WITHIN 5 DAYS.  LOWEST DETECTABLE LIMITS FOR URINE DRUG SCREEN Drug Class                     Cutoff (ng/mL) Amphetamine and metabolites    1000 Barbiturate and metabolites    200 Benzodiazepine                 888 Tricyclics and metabolites     300 Opiates and metabolites        300 Cocaine and metabolites        300 THC                            50 Performed at [facility], [place] [address], [place], [place] [address]    Adenovirus 10/13/2021 NOT DETECTED  NOT DETECTED Final   Coronavirus 229E 10/13/2021 NOT DETECTED  NOT DETECTED Final   Comment: (NOTE) The Coronavirus on the Respiratory Panel, DOES NOT test for the novel  Coronavirus (2019 nCoV)    Coronavirus HKU1 10/13/2021 NOT DETECTED  NOT DETECTED Final   Coronavirus NL63 10/13/2021 NOT DETECTED  NOT DETECTED Final   Coronavirus OC43 10/13/2021 NOT DETECTED  NOT DETECTED Final   Metapneumovirus 10/13/2021 NOT DETECTED  NOT DETECTED Final   Rhinovirus / Enterovirus 10/13/2021 NOT DETECTED  NOT DETECTED Final   Influenza A 10/13/2021 NOT DETECTED  NOT DETECTED Final   Influenza B 10/13/2021 NOT DETECTED  NOT DETECTED Final   Parainfluenza Virus 1 10/13/2021 NOT DETECTED  NOT DETECTED Final   Parainfluenza Virus 2 10/13/2021 NOT DETECTED  NOT DETECTED Final   Parainfluenza Virus 3 10/13/2021 NOT DETECTED  NOT DETECTED Final   Parainfluenza Virus 4 10/13/2021 NOT DETECTED  NOT DETECTED Final   Respiratory Syncytial Virus 10/13/2021 NOT DETECTED  NOT DETECTED Final   Bordetella pertussis 10/13/2021 NOT DETECTED  NOT DETECTED Final   Bordetella Parapertussis 10/13/2021 NOT DETECTED  NOT DETECTED Final    Chlamydophila pneumoniae 10/13/2021 NOT DETECTED  NOT DETECTED Final   Mycoplasma pneumoniae 10/13/2021 NOT DETECTED  NOT DETECTED Final   Performed at [facility], [place] [address] [place], [place] [address]   Procalcitonin 10/13/2021 <0.10  ng/mL Final   Comment:        Interpretation: PCT (Procalcitonin) <= 0.5 ng/mL: Systemic infection (sepsis) is not likely. Local bacterial infection is possible. (NOTE)       Sepsis PCT Algorithm           Lower Respiratory Tract                                      Infection PCT Algorithm    ----------------------------     ----------------------------         PCT < 0.25 ng/mL                PCT < 0.10 ng/mL  Strongly encourage             Strongly discourage   discontinuation of antibiotics    initiation of antibiotics    ----------------------------     -----------------------------       PCT 0.25 - 0.50 ng/mL            PCT 0.10 - 0.25 ng/mL               OR       >80% decrease in PCT            Discourage initiation of                                            antibiotics      Encourage discontinuation           of antibiotics    ----------------------------     -----------------------------         PCT >= 0.50 ng/mL              PCT 0.26 - 0.50 ng/mL               AND                                 <80% decrease in PCT             Encourage initiation of                                             antibiotics       Encourage continuation           of antibiotics    ----------------------------     -----------------------------        PCT >= 0.50 ng/mL                  PCT > 0.50 ng/mL               AND         increase in PCT                  Strongly encourage                                      initiation of antibiotics    Strongly encourage escalation           of antibiotics                                     -----------------------------                                           PCT <= 0.25 ng/mL  OR                                        > 80% decrease in PCT                                      Discontinue / Do not initiate                                             antibiotics  Performed at [facility], [place] [address], [place], [place] [address]    B Natriuretic Peptide 10/13/2021 23.6  0.0 - 100.0 pg/mL Final   Performed at [facility] [address], [place], [place] [address]   L. pneumophila Serogp 1 Ur Ag 10/13/2021 Negative  Negative Final   Comment: (NOTE) Presumptive negative for L. pneumophila serogroup 1 antigen in urine, suggesting no recent or current infection. Legionnaires' disease cannot be ruled out since other serogroups and species may also cause disease. Performed At: [facility] [place], [place] [id number] Rush Farmer MD HQ:[id number]    Source of Sample 10/13/2021 URINE, RANDOM   Final   Performed at [facility], [place] [address], [place], [place] [address]   Strep Pneumo Urinary Antigen 10/13/2021 NEGATIVE  NEGATIVE Final   Comment:        Infection due to S. pneumoniae cannot be absolutely ruled out since the antigen present may be below the detection limit of the test. Performed at [facility], [address] [place], [place] [address]    Sodium 10/14/2021 138  135 - 145 mmol/L Final   Potassium 10/14/2021 4.3  3.5 - 5.1 mmol/L Final   Chloride 10/14/2021 106  98 - 111 mmol/L Final   CO2 10/14/2021 25  22 - 32 mmol/L Final   Glucose, Bld 10/14/2021 286 (H)  70 - 99 mg/dL Final   Glucose reference range applies only to samples taken after fasting for at least 8 hours.   BUN 10/14/2021 14  8 - 23 mg/dL Final   Creatinine, Ser 10/14/2021 0.98  0.61 - 1.24 mg/dL Final   Calcium 10/14/2021 8.9  8.9 - 10.3 mg/dL Final   GFR, Estimated 10/14/2021 >60  >60 mL/min Final   Comment: (NOTE) Calculated using the CKD-EPI Creatinine Equation (2021)    Anion gap  10/14/2021 7  5 - 15 Final   Performed at [facility], [place] [address] [place], [place] [address]   WBC 10/14/2021 9.6  4.0 - 10.5 K/uL Final   RBC 10/14/2021 4.12 (L)  4.22 - 5.81 MIL/uL Final   Hemoglobin 10/14/2021 12.6 (L)  13.0 - 17.0 g/dL Final   HCT 10/14/2021 38.8 (L)  39.0 - 52.0 % Final   MCV 10/14/2021 94.2  80.0 - 100.0 fL Final   MCH 10/14/2021 30.6  26.0 - 34.0 pg Final   MCHC 10/14/2021 32.5  30.0 - 36.0 g/dL Final   RDW 10/14/2021 13.4  11.5 - 15.5 % Final   Platelets 10/14/2021 556 (H)  150 - 400 K/uL Final   nRBC 10/14/2021 0.0  0.0 - 0.2 % Final   Performed at [facility], [place] [address] [place], [place] [address]   Glucose-Capillary 10/13/2021 281 (H)  70 - 99 mg/dL Final   Glucose reference range applies only to samples taken after fasting for at least 8 hours.   Glucose-Capillary 10/13/2021 399 (H)  70 - 99 mg/dL Final   Glucose reference range applies only to samples taken after fasting for at least 8 hours.   Glucose-Capillary 10/14/2021 239 (H)  70 - 99 mg/dL Final   Glucose reference range applies only to samples taken after fasting for at least 8 hours.   Glucose-Capillary 10/14/2021 345 (H)  70 - 99 mg/dL Final   Glucose reference range applies only to samples taken after fasting for at least 8 hours.   Glucose-Capillary 10/14/2021 383 (H)  70 - 99 mg/dL Final   Glucose reference range applies only to samples taken after fasting for at least 8 hours.   Glucose-Capillary 10/15/2021 194 (H)  70 - 99 mg/dL Final   Glucose reference range applies only to samples taken after fasting for at least 8 hours.   Glucose-Capillary 10/15/2021 161 (H)  70 - 99 mg/dL Final   Glucose reference range applies only to samples taken after fasting for at least 8 hours.   Glucose-Capillary 10/15/2021 312 (H)  70 - 99 mg/dL Final   Glucose reference range applies only to samples taken after fasting for at least 8 hours.   Glucose-Capillary 10/14/2021 279 (H)  70 - 99  mg/dL Final   Glucose reference range applies only to samples taken after fasting for at least 8 hours.  Office Visit on 10/08/2021  Component Date Value Ref Range Status   POC Glucose 10/08/2021 321 (A)  70 - 99 mg/dl Final   HbA1c, POC (controlled diabetic ra* 10/08/2021 9.3 (A)  0.0 - 7.0 % Final  Admission on 10/07/2021, Discharged on 10/07/2021  Component Date Value Ref Range Status   Color, Urine 10/07/2021 YELLOW  YELLOW Final   APPearance 10/07/2021 HAZY (A)  CLEAR Final   Specific Gravity, Urine 10/07/2021 1.010  1.005 - 1.030 Final   pH 10/07/2021 5.0  5.0 - 8.0 Final   Glucose, UA 10/07/2021 >=500 (A)  NEGATIVE mg/dL Final   Hgb urine dipstick 10/07/2021 MODERATE (A)  NEGATIVE Final   Bilirubin Urine 10/07/2021 NEGATIVE  NEGATIVE Final   Ketones, ur 10/07/2021 NEGATIVE  NEGATIVE mg/dL Final   Protein, ur 10/07/2021 30 (A)  NEGATIVE mg/dL Final   Nitrite 10/07/2021 NEGATIVE  NEGATIVE Final   Leukocytes,Ua 10/07/2021 LARGE (A)  NEGATIVE Final   RBC / HPF 10/07/2021 21-50  0 - 5 RBC/hpf Final   WBC, UA 10/07/2021 >50 (H)  0 - 5 WBC/hpf Final   Bacteria, UA 10/07/2021 NONE SEEN  NONE SEEN Final   WBC Clumps 10/07/2021 PRESENT   Final   Mucus 10/07/2021 PRESENT   Final   Budding Yeast 10/07/2021 PRESENT   Final   Performed at [facility], [place] [address] [place], [place] [address]   WBC 10/07/2021 8.9  4.0 - 10.5 K/uL Final   RBC 10/07/2021 4.35  4.22 - 5.81 MIL/uL Final   Hemoglobin 10/07/2021 13.7  13.0 - 17.0 g/dL Final   HCT 10/07/2021 41.9  39.0 - 52.0 % Final   MCV 10/07/2021 96.3  80.0 - 100.0 fL Final   MCH 10/07/2021 31.5  26.0 - 34.0 pg Final   MCHC 10/07/2021 32.7  30.0 - 36.0 g/dL Final   RDW 10/07/2021 13.4  11.5 - 15.5 %  Final   Platelets 10/07/2021 376  150 - 400 K/uL Final   nRBC 10/07/2021 0.0  0.0 - 0.2 % Final   Neutrophils Relative % 10/07/2021 59  % Final   Neutro Abs 10/07/2021 5.2  1.7 - 7.7 K/uL Final   Lymphocytes Relative  10/07/2021 17  % Final   Lymphs Abs 10/07/2021 1.5  0.7 - 4.0 K/uL Final   Monocytes Relative 10/07/2021 19  % Final   Monocytes Absolute 10/07/2021 1.7 (H)  0.1 - 1.0 K/uL Final   Eosinophils Relative 10/07/2021 5  % Final   Eosinophils Absolute 10/07/2021 0.5  0.0 - 0.5 K/uL Final   Basophils Relative 10/07/2021 0  % Final   Basophils Absolute 10/07/2021 0.0  0.0 - 0.1 K/uL Final   Immature Granulocytes 10/07/2021 0  % Final   Abs Immature Granulocytes 10/07/2021 0.04  0.00 - 0.07 K/uL Final   Performed at [facility], [place] [address], [place], [place] [address]   Sodium 10/07/2021 141  135 - 145 mmol/L Final   Potassium 10/07/2021 4.0  3.5 - 5.1 mmol/L Final   Chloride 10/07/2021 107  98 - 111 mmol/L Final   CO2 10/07/2021 24  22 - 32 mmol/L Final   Glucose, Bld 10/07/2021 81  70 - 99 mg/dL Final   Glucose reference range applies only to samples taken after fasting for at least 8 hours.   BUN 10/07/2021 14  8 - 23 mg/dL Final   Creatinine, Ser 10/07/2021 1.19  0.61 - 1.24 mg/dL Final   Calcium 10/07/2021 9.4  8.9 - 10.3 mg/dL Final   Total Protein 10/07/2021 7.9  6.5 - 8.1 g/dL Final   Albumin 10/07/2021 3.5  3.5 - 5.0 g/dL Final   AST 10/07/2021 12 (L)  15 - 41 U/L Final   ALT 10/07/2021 10  0 - 44 U/L Final   Alkaline Phosphatase 10/07/2021 73  38 - 126 U/L Final   Total Bilirubin 10/07/2021 0.7  0.3 - 1.2 mg/dL Final   GFR, Estimated 10/07/2021 >60  >60 mL/min Final   Comment: (NOTE) Calculated using the CKD-EPI Creatinine Equation (2021)    Anion gap 10/07/2021 10  5 - 15 Final   Performed at [facility], [place] [address], [place], [place] [address]   Specimen Description 10/07/2021    Final                   Value:URINE, CLEAN CATCH Performed at [facility], [place] [address] [place], [place] [address]    Special Requests 10/07/2021    Final                   Value:NONE Performed at [facility],  [place] [address] [place], [place] [address]    Culture 10/07/2021  (A)   Final                   Value:40,000 COLONIES/mL YEAST 20,000 COLONIES/mL LACTOBACILLUS SPECIES Standardized susceptibility testing for this organism is not available. Performed at [facility], [place] [address] [place], [place] [address]    Report Status 10/07/2021 10/08/2021 FINAL   Final  Admission on 10/04/2021, Discharged on 10/04/2021  Component Date Value Ref Range Status   WBC 10/04/2021 9.3  4.0 - 10.5 K/uL Final   RBC 10/04/2021 4.26  4.22 - 5.81 MIL/uL Final   Hemoglobin 10/04/2021 13.4  13.0 - 17.0 g/dL Final   HCT 10/04/2021 41.5  39.0 - 52.0 % Final   MCV 10/04/2021 97.4  80.0 - 100.0 fL Final   MCH 10/04/2021 31.5  26.0 - 34.0 pg Final   MCHC 10/04/2021 32.3  30.0 - 36.0 g/dL Final   RDW 10/04/2021 13.6  11.5 - 15.5 % Final   Platelets 10/04/2021 367  150 - 400 K/uL Final   nRBC 10/04/2021 0.0  0.0 - 0.2 % Final   Performed at [facility], [place] [address], [place], [place] [address]   Sodium 10/04/2021 138  135 - 145 mmol/L Final   Potassium 10/04/2021 4.4  3.5 - 5.1 mmol/L Final   Chloride 10/04/2021 107  98 - 111 mmol/L Final   CO2 10/04/2021 22  22 - 32 mmol/L Final   Glucose, Bld 10/04/2021 236 (H)  70 - 99 mg/dL Final   Glucose reference range applies only to samples taken after fasting for at least 8 hours.   BUN 10/04/2021 13  8 - 23 mg/dL Final   Creatinine, Ser 10/04/2021 1.15  0.61 - 1.24 mg/dL Final   Calcium 10/04/2021 8.9  8.9 - 10.3 mg/dL Final   GFR, Estimated 10/04/2021 >60  >60 mL/min Final   Comment: (NOTE) Calculated using the CKD-EPI Creatinine Equation (2021)    Anion gap 10/04/2021 9  5 - 15 Final   Performed at [facility], [place] [address], [place], [place] [address]  There may be more visits with results that are not included.    Blood Alcohol level:  Lab Results  Component Value Date   ETH <10 01/20/2022   ETH <10  28/41/3244    Metabolic Disorder Labs: Lab Results  Component Value Date   HGBA1C 10.4 (H) 01/21/2022   MPG 251.78 01/21/2022   MPG >398 02/24/2021   No results found for: "PROLACTIN" Lab Results  Component Value Date   CHOL 213 (H) 05/23/2021   TRIG 115 05/23/2021   HDL 71 05/23/2021   CHOLHDL 3.2 11/03/2019   VLDL 32 (H) 05/03/2015   LDLCALC 122 (H) 05/23/2021   LDLCALC 99 11/03/2019    Therapeutic Lab Levels: No results found for: "LITHIUM" No results found for: "VALPROATE" No results found for: "CBMZ"  Physical Findings   GAD-7    Flowsheet Row Office Visit from 10/08/2021 in [place] Office Visit from 09/05/2021 in [place] [facility] Office Visit from 12/26/2020 in [place] Office Visit from 11/17/2018 in [place] Office Visit from 06/05/2017 in [place]  Total GAD-7 Score _0 0 12      PHQ2-9    [place] ED from 01/21/2022 in [facility] Office Visit from 10/08/2021 in [place] Office Visit from 09/05/2021 in [place] [facility] ED from 07/02/2021 in [facility] Office Visit from 12/26/2020 in [place]  PHQ-2 Total Score _1 PHQ-9 Total Score -- _2 Flowsheet Row ED from 01/21/2022 in [facility] ED from 01/20/2022 in [place] ED from 01/18/2022 in [place] CATEGORY Low Risk Low Risk No Risk        Musculoskeletal  Strength & Muscle Tone: within normal limits Gait & Station: normal Patient leans: [place]  Specialty Exam  Presentation  General Appearance:  Appropriate for Environment; Casual; Fairly Groomed  Eye Contact: Good  Speech: Clear  and Coherent; Normal Rate  Speech Volume: Normal  Handedness: Right   Mood and Affect  Mood: -- (good)  Affect: Appropriate; Congruent; Full Range   Thought Process  Thought Processes: Coherent; Goal Directed  Descriptions of Associations:Circumstantial  Orientation:Full (Time, Place and Person)  Thought Content:Logical  Diagnosis of Schizophrenia or Schizoaffective disorder in past: No    Hallucinations:Hallucinations: None (Denied AVH)   Ideas of Reference:None  Suicidal Thoughts:Suicidal Thoughts: No (Contracted to safety)   Homicidal Thoughts:Homicidal Thoughts: No    Sensorium  Memory: Immediate Good; Recent Fair  Judgment: Fair  Insight: Fair   Community education officer  Concentration: Fair  Attention Span: Fair  Recall: [place] of Knowledge: Fair  Language: Good   Psychomotor Activity  Psychomotor Activity: Psychomotor Activity: Normal    Assets  Assets: Communication Skills; Desire for Improvement; Resilience   Sleep  Sleep: Sleep: Good   Physical Exam  Physical Exam HENT:     Head: Atraumatic.  Pulmonary:     Effort: Pulmonary effort is normal.  Skin:    General: Skin is warm and dry.  Neurological:     Mental Status: He is alert.    Review of Systems  Respiratory:  Negative for shortness of breath.   Cardiovascular:  Negative for chest pain.  Gastrointestinal:  Negative for abdominal pain and nausea.       Still very hungry, subjectively improving  Genitourinary:        Mild urinary incontinence  Musculoskeletal:  Positive for back pain and joint pain.  Neurological:  Negative for dizziness.   Blood pressure (!) 127/99, pulse 82, temperature (!) 97.5 F (36.4 C), temperature source Oral, resp. rate 18, SpO2 98 %. There is no height or weight on file to calculate BMI.  Treatment Plan Summary: Daily contact with patient to assess and evaluate symptoms and progress in treatment and Medication  management  Frisco Cordts is a 61 y.o. male, with PMH cocaine use disorder, tobacco use disorder, housing instability, DM2 + neuropathy, HTN, HLD, no inpatient psych admission, no suicide attempts, who initially presented to [facility] (01/20/2022) for passive SI and assistance with rehab for crack cocaine use.  Patient was then IVC by EDP, which was rescinded on 01/21/2022 by Mayra Neer, MD.  He was then admitted Voluntary to [facility] (01/21/2022) via EMS for assistance with residential placement.      Cocaine use d/o  Tobacco use d/o  Action stage. Encouraged cessation Comfort PRNs    Substance induced mood d/o Continued home Cymbalta 60 mg daily  Chronic Pain - Continue gabapentin 63m TID - Continue PRN Tylenol   DM2-insulin-dependent with neuropathy, IMPROVING Urge Incontinence A1c 10.4 (01/21/2022), from 9.7 earlier this year. Goal CBGs <180. CBGs <200 with no hypoglycemic events.  After educating on uncontrolled DM2, patient continues to work diligently on diet, self administering insulin and foot exam. In regards to urge incontinence patient has been able to make it to bathroom every time during this stay. Urge incontinence has been improving with better glycemic control.  Continued home metformin 1000 mg twice daily Continued home glipizide 10 mg with breakfast Continued home empagliflozin 10 mg with breakfast Continued home long acting insulin, self administered - 24 units daily  Continued sensitive sliding scale  Continued short acting 3 units BID for hypoglycemia per above (home dose was 5 units BID) Continued Gabapentin 600 mg  TID and Cymbalta per above for neuropathy Educated on self foot exam twice daily   Increased appetite Hx of GERD Improving, still very hungry Continue pepcid 4m BID  Considerations for follow-up: Recommend high risk screening every 6-12 months with HIV, RPR, hepatitis panel PCP for insulin dependent DM2 - uncontrolled [facility]    DISPO: Tentative date: 11/27 [facility] [place] [facility] for disability bed  [facility] for insulin dependency   PGY-2 JMerrily Brittle DO 01/29/2022 12:50 PM

## 2022-01-29 NOTE — ED Notes (Signed)
Pt requested for trazodone to help him sleep.

## 2022-01-29 NOTE — ED Notes (Signed)
Pt has c/o of right big toe pain. Pt states it feels like a nail is in his toe. Pt is limping. Pt states it was purple he states he peeled it. Foot is looking dry no nail is in it. NP Ene  notified

## 2022-01-29 NOTE — ED Notes (Signed)
Patient is comfortable on unit.  He can be irritable at time with mood lability.  He can be intrusive and demanding.  Patient has difficulty with delay of gratification and requires needs to be met immediately.  Patient has improved his dietary intake with assistance from staff and cbg this morning was 111.  Patient is awaiting placement to rehab facility.  Denies avh shi or plan.  Insight is fair.  Will monitor and provide safe environment for him and support around maintaining dietary limits.

## 2022-01-29 NOTE — Telephone Encounter (Signed)
See previous message patient spoke with Dr.Newlin

## 2022-01-30 DIAGNOSIS — E785 Hyperlipidemia, unspecified: Secondary | ICD-10-CM | POA: Diagnosis not present

## 2022-01-30 DIAGNOSIS — F1420 Cocaine dependence, uncomplicated: Secondary | ICD-10-CM | POA: Diagnosis not present

## 2022-01-30 DIAGNOSIS — Z720 Tobacco use: Secondary | ICD-10-CM | POA: Diagnosis not present

## 2022-01-30 DIAGNOSIS — F1410 Cocaine abuse, uncomplicated: Secondary | ICD-10-CM | POA: Diagnosis not present

## 2022-01-30 DIAGNOSIS — I10 Essential (primary) hypertension: Secondary | ICD-10-CM | POA: Diagnosis not present

## 2022-01-30 LAB — GLUCOSE, CAPILLARY
Glucose-Capillary: 157 mg/dL — ABNORMAL HIGH (ref 70–99)
Glucose-Capillary: 168 mg/dL — ABNORMAL HIGH (ref 70–99)
Glucose-Capillary: 217 mg/dL — ABNORMAL HIGH (ref 70–99)
Glucose-Capillary: 246 mg/dL — ABNORMAL HIGH (ref 70–99)
Glucose-Capillary: 287 mg/dL — ABNORMAL HIGH (ref 70–99)

## 2022-01-30 MED ORDER — TAMSULOSIN HCL 0.4 MG PO CAPS
0.8000 mg | ORAL_CAPSULE | Freq: Every day | ORAL | Status: DC
  Administered 2022-01-31 – 2022-02-06 (×7): 0.8 mg via ORAL
  Filled 2022-01-30 (×7): qty 2

## 2022-01-30 NOTE — ED Notes (Addendum)
Patient a&o x 3 Routine conducted according to faculty protocol .Encourge patient to notify  staff with any needs or concerns. Patient verbalized agreement .Will continue to monitor for safety.

## 2022-01-30 NOTE — ED Notes (Signed)
Patient continues to split staff.  He is irritable and intrusive.  Attention seeking and defiant.  Patient has poor insight into illness and is resistant to redirection or education.  Patient has difficulty expressing himself appropriately or making his needs known clearly.  He has poor boundaries and responds angrily when staff attempt to establish them.  Patient verbally aggressive towards staff.  Security, Education officer, museum and M.D. spoke with him at length.  Patient also resistant to adhering to diabetic diet.  He becomes angry when informed that he cannot eat certain food items and overeats at each meal despite staff attempting to educated him or to limit intake.  Will monitor and continue to provide safety on unit.

## 2022-01-30 NOTE — ED Notes (Signed)
Pt given permission by facility manager to allow outside food to be brought to facility tomorrow.

## 2022-01-30 NOTE — ED Notes (Signed)
Pt states he cannot hold his urine. He is urinating in cups and pouring down the toilet.

## 2022-01-30 NOTE — ED Notes (Signed)
Pt is in the bed sleeping. Respirations are even and unlabored. No acute distress noted. Will continue to monitor for safety. 

## 2022-01-30 NOTE — Progress Notes (Signed)
Patient is attending group run by Education officer, museum.

## 2022-01-30 NOTE — ED Notes (Signed)
Pt sleeping@this time. Breathing even and unlabored. Will continue to monitor for safety 

## 2022-01-30 NOTE — ED Provider Notes (Cosign Needed Addendum)
Behavioral Health Progress Note  Date and Time: 01/30/2022 7:10 PM Name: David Wyatt MRN:  [id number]  Subjective:   David Wyatt is a 61 y.o. male, with PMH cocaine use disorder, tobacco use disorder, housing instability, DM2 + neuropathy, HTN, HLD, no inpatient psych admission, no suicide attempts, who initially presented to [facility] (01/20/2022) for passive SI and assistance with rehab for crack cocaine use. Patient was then IVC by EDP, which was rescinded on 01/21/2022 by Mayra Neer, MD.  He was then admitted Voluntary to [facility] (01/21/2022) via EMS for assistance with residential placement.     Patient stated that his big toe pain has much improved, stated that he believes it was from his tight shoe.  In regards to his incontinence, he continues to endorse improvement, however did urinate in cups last night because he was tucked into bed too tightly. Denied having to do so today. He had no other questions or concerns and is still amenable to residential rehab.  Suicidal Thoughts: No (Contracted to safety) Homicidal Thoughts: No Hallucinations: None (Denied AVH)  Mood:  (good) Sleep:Good Appetite: Excessive, slowly normalizing  Diagnosis:  Final diagnoses:  Dermoid cyst of skin of back  Tobacco abuse  Diabetic polyneuropathy associated with type 2 diabetes mellitus (HCC)  Cocaine use disorder, severe, dependence ([place])    Total Time spent with patient: 30 minutes  Past Psychiatric History: See H&P Past Medical History:  Past Medical History:  Diagnosis Date   Angioedema of lips 07/28/2012   left upper (07/29/2012)   Arthritis    hands and back   Chronic back pain    Cocaine abuse (HCC)    Diabetic neuropathy (HCC)    High cholesterol    Hypertension    Neuropathy    Type II diabetes mellitus ([place])     Past Surgical History:  Procedure Laterality Date   BACK SURGERY     CYSTOSCOPY W/ URETERAL STENT PLACEMENT Right 02/23/2021   Procedure: CYSTOSCOPY WITH RETROGRADE  PYELOGRAM/URETERAL STENT PLACEMENT;  Surgeon: Janith Lima, MD;  Location: [facility];  Service: Urology;  Laterality: Right;   HERNIA REPAIR Right 04/01/2012   I & D EXTREMITY Left 12/13/2012   Procedure: IRRIGATION AND DEBRIDEMENT LEFT THUMB;  Surgeon: Tennis Must, MD;  Location: [place];  Service: Orthopedics;  Laterality: Left;   INCISION AND DRAINAGE OF WOUND     boil on back/notes 07/14/2008 (07/29/2012)   INGUINAL HERNIA REPAIR  04/01/2012   Procedure: HERNIA REPAIR INGUINAL ADULT;  Surgeon: Haywood Lasso, MD;  Location: [place];  Service: General;  Laterality: Right;   INGUINAL HERNIA REPAIR Left 09/06/2016   Procedure: OPEN REPAIR LEFT INGUINAL HERNIA;  Surgeon: Greer Pickerel, MD;  Location: [place];  Service: General;  Laterality: Left;   INSERTION OF MESH Left 09/06/2016   Procedure: INSERTION OF MESH;  Surgeon: Greer Pickerel, MD;  Location: [place];  Service: General;  Laterality: Left;   TONSILLECTOMY     Family History:  Family History  Problem Relation Age of Onset   Diabetes Mother    Kidney disease Mother    Hyperlipidemia Mother    Hyperlipidemia Father    Diabetes Father    Family Psychiatric  History: See H&P Social History:  Social History   Substance and Sexual Activity  Alcohol Use No   Alcohol/week: 0.0 standard drinks of alcohol     Social History   Substance and Sexual Activity  Drug Use Not Currently   Types: "Crack" cocaine  Social History   Socioeconomic History   Marital status: Divorced    Spouse name: Not on file   Number of children: 3   Years of education: 12   Highest education level: Not on file  Occupational History    Comment: disabled  Tobacco Use   Smoking status: Every Day    Packs/day: 0.25    Years: 30.00    Total pack years: 7.50    Types: Cigarettes   Smokeless tobacco: Former    Quit date: 08/15/2015   Tobacco comments:    04/29/16 2  cigs daily, 10/27/17 sometimes < .25 PPD  Vaping Use   Vaping Use: Never used  Substance and  Sexual Activity   Alcohol use: No    Alcohol/week: 0.0 standard drinks of alcohol   Drug use: Not Currently    Types: "Crack" cocaine   Sexual activity: Not on file  Other Topics Concern   Not on file  Social History Narrative   04/29/17   Lives in shelter   Caffeine- a lot of  tea, coffee   Social Determinants of Health   Financial Resource Strain: Not on file  Food Insecurity: Not on file  Transportation Needs: Not on file  Physical Activity: Not on file  Stress: Not on file  Social Connections: Not on file   SDOH:  SDOH Screenings   Depression (PHQ2-9): Low Risk  (01/22/2022)  Tobacco Use: High Risk (01/23/2022)   Additional Social History:                          Current Medications:  Current Facility-Administered Medications  Medication Dose Route Frequency Provider Last Rate Last Admin   acetaminophen (TYLENOL) tablet 650 mg  650 mg Oral Q6H PRN Merrily Brittle, DO   650 mg at 01/29/22 1841   alum & mag hydroxide-simeth (MAALOX/MYLANTA) 200-200-20 MG/5ML suspension 30 mL  30 mL Oral Q4H PRN Merrily Brittle, DO       amLODipine (NORVASC) tablet 10 mg  10 mg Oral Daily Merrily Brittle, DO   10 mg at 01/30/22 0926   atorvastatin (LIPITOR) tablet 40 mg  40 mg Oral Daily Merrily Brittle, DO   40 mg at 01/30/22 0926   DULoxetine (CYMBALTA) DR capsule 60 mg  60 mg Oral Daily Merrily Brittle, DO   60 mg at 01/30/22 0926   empagliflozin (JARDIANCE) tablet 10 mg  10 mg Oral QAC breakfast Merrily Brittle, DO   10 mg at 01/30/22 0831   famotidine (PEPCID) tablet 10 mg  10 mg Oral BID Damita Dunnings B, MD   10 mg at 01/30/22 0926   feeding supplement (GLUCERNA SHAKE) (GLUCERNA SHAKE) liquid 237 mL  237 mL Oral TID BM Merrily Brittle, DO   237 mL at 01/30/22 1810   gabapentin (NEURONTIN) capsule 600 mg  600 mg Oral TID Merrily Brittle, DO   600 mg at 01/30/22 1631   glipiZIDE (GLUCOTROL XL) 24 hr tablet 10 mg  10 mg Oral Q breakfast Merrily Brittle, DO   10 mg at 01/30/22 0831   insulin  aspart (novoLOG) injection 0-6 Units  0-6 Units Subcutaneous TID WC Merrily Brittle, DO   2 Units at 01/30/22 1631   insulin aspart (novoLOG) injection 3 Units  3 Units Subcutaneous BID AC Merrily Brittle, DO   3 Units at 01/30/22 1631   insulin glargine-yfgn (SEMGLEE) injection 24 Units  24 Units Subcutaneous Daily Green, Terri L, RPH   24 Units at  01/30/22 0928   lubriderm seriously sensitive lotion 1 Application  1 Application Topical BID Merrily Brittle, DO   1 Application at 36/64/40 3474   magnesium hydroxide (MILK OF MAGNESIA) suspension 30 mL  30 mL Oral Daily PRN Merrily Brittle, DO       metFORMIN (GLUCOPHAGE) tablet 1,000 mg  1,000 mg Oral BID WC Merrily Brittle, DO   1,000 mg at 01/30/22 1631   nicotine (NICODERM CQ - dosed in mg/24 hours) patch 21 mg  21 mg Transdermal Q0600 Merrily Brittle, DO   21 mg at 01/30/22 2595   nicotine polacrilex (NICORETTE) gum 4 mg  4 mg Oral PRN Merrily Brittle, DO       [START ON 01/31/2022] tamsulosin (FLOMAX) capsule 0.8 mg  0.8 mg Oral Daily Merrily Brittle, DO       traZODone (DESYREL) tablet 50 mg  50 mg Oral QHS PRN Evette Georges, NP   50 mg at 01/29/22 2120   Current Outpatient Medications  Medication Sig Dispense Refill   Accu-Chek Softclix Lancets lancets Use up to 4 times daily as directed 100 each 0   amLODipine (NORVASC) 10 MG tablet Take 1 tablet (10 mg total) by mouth daily. 30 tablet 0   atorvastatin (LIPITOR) 40 MG tablet Take 1 tablet (40 mg total) by mouth daily. 30 tablet 0   Blood Glucose Monitoring Suppl (ACCU-CHEK GUIDE) w/Device KIT USE AS DIRECTED TO CHECK BLOOD SUGARS UP TO TWICE PER DAY 1 kit 0   DULoxetine (CYMBALTA) 60 MG capsule Take 1 capsule (60 mg total) by mouth daily. 30 capsule 0   empagliflozin (JARDIANCE) 10 MG TABS tablet Take 1 tablet (10 mg total) by mouth daily before breakfast. 30 tablet 0   gabapentin (NEURONTIN) 300 MG capsule Take 2 capsules (600 mg total) by mouth 3 (three) times daily. 180 capsule 0   glipiZIDE (GLUCOTROL  XL) 10 MG 24 hr tablet Take 1 tablet (10 mg total) by mouth daily with breakfast. 30 tablet 0   glucose blood (ACCU-CHEK GUIDE) test strip Use up to 3 times daily as directed 100 each 3   Insulin Glargine Solostar (LANTUS) 100 UNIT/ML Solostar Pen Inject 24 Units into the skin daily. 15 mL 2   insulin lispro (HUMALOG) 100 UNIT/ML KwikPen Inject 5 Units into the skin 2 (two) times daily before lunch and supper. (Patient not taking: Reported on 01/20/2022) 6 mL 6   Insulin Pen Needle 31G X 8 MM MISC Use with insulin pen 100 each 1   metFORMIN (GLUCOPHAGE) 1000 MG tablet Take 1 tablet (1,000 mg total) by mouth 2 (two) times daily. 60 tablet 0   tamsulosin (FLOMAX) 0.4 MG CAPS capsule Take 1 capsule (0.4 mg total) by mouth daily. For renal calculi 30 capsule 0    Labs  Lab Results:  Admission on 01/21/2022  Component Date Value Ref Range Status   Hgb A1c MFr Bld 01/21/2022 10.4 (H)  4.8 - 5.6 % Final   Comment: (NOTE) Pre diabetes:          5.7%-6.4%  Diabetes:              >6.4%  Glycemic control for   <7.0% adults with diabetes    Mean Plasma Glucose 01/21/2022 251.78  mg/dL Final   Performed at [facility], [place] [address], [place], [place] [address]   TSH 01/21/2022 0.248 (L)  0.350 - 4.500 uIU/mL Final   Comment: Performed by a 3rd Generation assay with a functional sensitivity  of <=0.01 uIU/mL. Performed at [facility], [place] [address], [place], [place] [address]    Glucose-Capillary 01/21/2022 374 (H)  70 - 99 mg/dL Final   Glucose reference range applies only to samples taken after fasting for at least 8 hours.   Glucose-Capillary 01/21/2022 289 (H)  70 - 99 mg/dL Final   Glucose reference range applies only to samples taken after fasting for at least 8 hours.   Glucose-Capillary 01/22/2022 137 (H)  70 - 99 mg/dL Final   Glucose reference range applies only to samples taken after fasting for at least 8 hours.   Glucose-Capillary 01/22/2022 297 (H)  70 - 99 mg/dL  Final   Glucose reference range applies only to samples taken after fasting for at least 8 hours.   T3, Free 01/23/2022 2.2  2.0 - 4.4 pg/mL Final   Comment: (NOTE) Performed At: [facility] [place], [place] [id number] Rush Farmer MD YT:[id number]    Glucose-Capillary 01/22/2022 94  70 - 99 mg/dL Final   Glucose reference range applies only to samples taken after fasting for at least 8 hours.   Free T4 01/23/2022 0.72  0.61 - 1.12 ng/dL Final   Comment: (NOTE) Biotin ingestion may interfere with free T4 tests. If the results are inconsistent with the TSH level, previous test results, or the clinical presentation, then consider biotin interference. If needed, order repeat testing after stopping biotin. Performed at [facility], [place] [address], [place], [place] [address]    TSH 01/23/2022 0.203 (L)  0.350 - 4.500 uIU/mL Final   Comment: Performed by a 3rd Generation assay with a functional sensitivity of <=0.01 uIU/mL. Performed at [facility], [place] [address], [place], [place] [address]    Glucose-Capillary 01/23/2022 132 (H)  70 - 99 mg/dL Final   Glucose reference range applies only to samples taken after fasting for at least 8 hours.   Glucose-Capillary 01/22/2022 59 (L)  70 - 99 mg/dL Final   Glucose reference range applies only to samples taken after fasting for at least 8 hours.   Glucose-Capillary 01/22/2022 177 (H)  70 - 99 mg/dL Final   Glucose reference range applies only to samples taken after fasting for at least 8 hours.   Glucose-Capillary 01/23/2022 284 (H)  70 - 99 mg/dL Final   Glucose reference range applies only to samples taken after fasting for at least 8 hours.   Glucose-Capillary 01/23/2022 188 (H)  70 - 99 mg/dL Final   Glucose reference range applies only to samples taken after fasting for at least 8 hours.   Glucose-Capillary 01/23/2022 294 (H)  70 - 99 mg/dL Final   Glucose reference range applies only to samples  taken after fasting for at least 8 hours.   Glucose-Capillary 01/24/2022 180 (H)  70 - 99 mg/dL Final   Glucose reference range applies only to samples taken after fasting for at least 8 hours.   Glucose-Capillary 01/24/2022 275 (H)  70 - 99 mg/dL Final   Glucose reference range applies only to samples taken after fasting for at least 8 hours.   Glucose-Capillary 01/24/2022 146 (H)  70 - 99 mg/dL Final   Glucose reference range applies only to samples taken after fasting for at least 8 hours.   Glucose-Capillary 01/24/2022 137 (H)  70 - 99 mg/dL Final   Glucose reference range applies only to samples taken after fasting for at least 8 hours.   Glucose-Capillary 01/25/2022 201 (H)  70 - 99 mg/dL Final   Glucose reference range applies only to samples taken after fasting for at least 8 hours.   Glucose-Capillary 01/25/2022 195 (H)  70 - 99 mg/dL Final   Glucose reference range applies only to samples taken after fasting for at least 8 hours.   Glucose-Capillary 01/25/2022 149 (H)  70 - 99 mg/dL Final   Glucose reference range applies only to samples taken after fasting for at least 8 hours.   Glucose-Capillary 01/25/2022 147 (H)  70 - 99 mg/dL Final   Glucose reference range applies only to samples taken after fasting for at least 8 hours.   Glucose-Capillary 01/26/2022 123 (H)  70 - 99 mg/dL Final   Glucose reference range applies only to samples taken after fasting for at least 8 hours.   Glucose-Capillary 01/26/2022 176 (H)  70 - 99 mg/dL Final   Glucose reference range applies only to samples taken after fasting for at least 8 hours.   Glucose-Capillary 01/26/2022 84  70 - 99 mg/dL Final   Glucose reference range applies only to samples taken after fasting for at least 8 hours.   Glucose-Capillary 01/26/2022 177 (H)  70 - 99 mg/dL Final   Glucose reference range applies only to samples taken after fasting for at least 8 hours.   Glucose-Capillary 01/27/2022 150 (H)  70 - 99 mg/dL Final    Glucose reference range applies only to samples taken after fasting for at least 8 hours.   Glucose-Capillary 01/27/2022 171 (H)  70 - 99 mg/dL Final   Glucose reference range applies only to samples taken after fasting for at least 8 hours.   Glucose-Capillary 01/27/2022 159 (H)  70 - 99 mg/dL Final   Glucose reference range applies only to samples taken after fasting for at least 8 hours.   Glucose-Capillary 01/27/2022 67 (L)  70 - 99 mg/dL Final   Glucose reference range applies only to samples taken after fasting for at least 8 hours.   Glucose-Capillary 01/27/2022 107 (H)  70 - 99 mg/dL Final   Glucose reference range applies only to samples taken after fasting for at least 8 hours.   Glucose-Capillary 01/27/2022 144 (H)  70 - 99 mg/dL Final   Glucose reference range applies only to samples taken after fasting for at least 8 hours.   Glucose-Capillary 01/28/2022 175 (H)  70 - 99 mg/dL Final   Glucose reference range applies only to samples taken after fasting for at least 8 hours.   Glucose-Capillary 01/28/2022 198 (H)  70 - 99 mg/dL Final   Glucose reference range applies only to samples taken after fasting for at least 8 hours.   Glucose-Capillary 01/28/2022 186 (H)  70 - 99 mg/dL Final   Glucose reference range applies only to samples taken after fasting for at least 8 hours.   Glucose-Capillary 01/28/2022 198 (H)  70 - 99 mg/dL Final   Glucose reference range applies only to samples taken after fasting for at least 8 hours.   Glucose-Capillary 01/29/2022 111 (H)  70 - 99 mg/dL Final   Glucose reference range applies only to samples taken after fasting for at least 8 hours.   Glucose-Capillary 01/29/2022 160 (H)  70 - 99 mg/dL Final   Glucose reference range applies only to samples taken after fasting for at least 8 hours.   Glucose-Capillary 01/29/2022 209 (H)  70 - 99 mg/dL Final   Glucose reference range applies only to samples taken after fasting for at least  8 hours.    Glucose-Capillary 01/29/2022 155 (H)  70 - 99 mg/dL Final   Glucose reference range applies only to samples taken after fasting for at least 8 hours.   Glucose-Capillary 01/30/2022 157 (H)  70 - 99 mg/dL Final   Glucose reference range applies only to samples taken after fasting for at least 8 hours.   Glucose-Capillary 01/30/2022 168 (H)  70 - 99 mg/dL Final   Glucose reference range applies only to samples taken after fasting for at least 8 hours.   Glucose-Capillary 01/30/2022 217 (H)  70 - 99 mg/dL Final   Glucose reference range applies only to samples taken after fasting for at least 8 hours.   Glucose-Capillary 01/30/2022 246 (H)  70 - 99 mg/dL Final   Glucose reference range applies only to samples taken after fasting for at least 8 hours.  Admission on 01/20/2022, Discharged on 01/21/2022  Component Date Value Ref Range Status   SARS Coronavirus 2 by RT PCR 01/20/2022 NEGATIVE  NEGATIVE Final   Comment: (NOTE) SARS-CoV-2 target nucleic acids are NOT DETECTED.  The SARS-CoV-2 RNA is generally detectable in upper respiratory specimens during the acute phase of infection. The lowest concentration of SARS-CoV-2 viral copies this assay can detect is 138 copies/mL. A negative result does not preclude SARS-Cov-2 infection and should not be used as the sole basis for treatment or other patient management decisions. A negative result may occur with  improper specimen collection/handling, submission of specimen other than nasopharyngeal swab, presence of viral mutation(s) within the areas targeted by this assay, and inadequate number of viral copies(<138 copies/mL). A negative result must be combined with clinical observations, patient history, and epidemiological information. The expected result is Negative.  Fact Sheet for Patients:  EntrepreneurPulse.com.au  Fact Sheet for Healthcare Providers:  IncredibleEmployment.be  This test is no                           t yet approved or cleared by the [place] FDA and  has been authorized for detection and/or diagnosis of SARS-CoV-2 by FDA under an Emergency Use Authorization (EUA). This EUA will remain  in effect (meaning this test can be used) for the duration of the COVID-19 declaration under Section 564(b)(1) of the Act, 21 U.S.C.section 360bbb-3(b)(1), unless the authorization is terminated  or revoked sooner.       Influenza A by PCR 01/20/2022 NEGATIVE  NEGATIVE Final   Influenza B by PCR 01/20/2022 NEGATIVE  NEGATIVE Final   Comment: (NOTE) The Xpert Xpress SARS-CoV-2/FLU/RSV plus assay is intended as an aid in the diagnosis of influenza from Nasopharyngeal swab specimens and should not be used as a sole basis for treatment. Nasal washings and aspirates are unacceptable for Xpert Xpress SARS-CoV-2/FLU/RSV testing.  Fact Sheet for Patients: EntrepreneurPulse.com.au  Fact Sheet for Healthcare Providers: IncredibleEmployment.be  This test is not yet approved or cleared by the [place] FDA and has been authorized for detection and/or diagnosis of SARS-CoV-2 by FDA under an Emergency Use Authorization (EUA). This EUA will remain in effect (meaning this test can be used) for the duration of the COVID-19 declaration under Section 564(b)(1) of the Act, 21 U.S.C. section 360bbb-3(b)(1), unless the authorization is terminated or revoked.  Performed at [facility], [place] [address] [place], [place] [address]    Sodium 01/20/2022 136  135 - 145 mmol/L Final   Potassium 01/20/2022 4.0  3.5 - 5.1 mmol/L Final  Chloride 01/20/2022 103  98 - 111 mmol/L Final   CO2 01/20/2022 24  22 - 32 mmol/L Final   Glucose, Bld 01/20/2022 360 (H)  70 - 99 mg/dL Final   Glucose reference range applies only to samples taken after fasting for at least 8 hours.   BUN 01/20/2022 12  8 - 23 mg/dL Final   Creatinine, Ser 01/20/2022 1.01  0.61 - 1.24  mg/dL Final   Calcium 01/20/2022 9.4  8.9 - 10.3 mg/dL Final   Total Protein 01/20/2022 7.3  6.5 - 8.1 g/dL Final   Albumin 01/20/2022 3.4 (L)  3.5 - 5.0 g/dL Final   AST 01/20/2022 14 (L)  15 - 41 U/L Final   ALT 01/20/2022 10  0 - 44 U/L Final   Alkaline Phosphatase 01/20/2022 101  38 - 126 U/L Final   Total Bilirubin 01/20/2022 0.3  0.3 - 1.2 mg/dL Final   GFR, Estimated 01/20/2022 >60  >60 mL/min Final   Comment: (NOTE) Calculated using the CKD-EPI Creatinine Equation (2021)    Anion gap 01/20/2022 9  5 - 15 Final   Performed at [facility] [address], [place], [place] [address]   Alcohol, Ethyl (B) 01/20/2022 <10  <10 mg/dL Final   Comment: (NOTE) Lowest detectable limit for serum alcohol is 10 mg/dL.  For medical purposes only. Performed at [facility], [place] [address] [place], [place] [address]    Opiates 01/20/2022 NONE DETECTED  NONE DETECTED Final   Cocaine 01/20/2022 POSITIVE (A)  NONE DETECTED Final   Benzodiazepines 01/20/2022 NONE DETECTED  NONE DETECTED Final   Amphetamines 01/20/2022 NONE DETECTED  NONE DETECTED Final   Tetrahydrocannabinol 01/20/2022 NONE DETECTED  NONE DETECTED Final   Barbiturates 01/20/2022 NONE DETECTED  NONE DETECTED Final   Comment: (NOTE) DRUG SCREEN FOR MEDICAL PURPOSES ONLY.  IF CONFIRMATION IS NEEDED FOR ANY PURPOSE, NOTIFY LAB WITHIN 5 DAYS.  LOWEST DETECTABLE LIMITS FOR URINE DRUG SCREEN Drug Class                     Cutoff (ng/mL) Amphetamine and metabolites    1000 Barbiturate and metabolites    200 Benzodiazepine                 200 Opiates and metabolites        300 Cocaine and metabolites        300 THC                            50 Performed at [facility], [place] [address] [place], [place] [address]    WBC 01/20/2022 5.2  4.0 - 10.5 K/uL Final   RBC 01/20/2022 4.73  4.22 - 5.81 MIL/uL Final   Hemoglobin 01/20/2022 14.3  13.0 - 17.0 g/dL Final   HCT 01/20/2022 43.1  39.0 - 52.0 % Final    MCV 01/20/2022 91.1  80.0 - 100.0 fL Final   MCH 01/20/2022 30.2  26.0 - 34.0 pg Final   MCHC 01/20/2022 33.2  30.0 - 36.0 g/dL Final   RDW 01/20/2022 14.1  11.5 - 15.5 % Final   Platelets 01/20/2022 444 (H)  150 - 400 K/uL Final   nRBC 01/20/2022 0.0  0.0 - 0.2 % Final   Neutrophils Relative % 01/20/2022 61  % Final   Neutro Abs 01/20/2022 3.2  1.7 - 7.7 K/uL Final   Lymphocytes Relative 01/20/2022 29  %  Final   Lymphs Abs 01/20/2022 1.5  0.7 - 4.0 K/uL Final   Monocytes Relative 01/20/2022 7  % Final   Monocytes Absolute 01/20/2022 0.4  0.1 - 1.0 K/uL Final   Eosinophils Relative 01/20/2022 2  % Final   Eosinophils Absolute 01/20/2022 0.1  0.0 - 0.5 K/uL Final   Basophils Relative 01/20/2022 1  % Final   Basophils Absolute 01/20/2022 0.0  0.0 - 0.1 K/uL Final   Immature Granulocytes 01/20/2022 0  % Final   Abs Immature Granulocytes 01/20/2022 0.01  0.00 - 0.07 K/uL Final   Performed at [facility], [place] [address] [place], [place] [address]   Salicylate Lvl 35/36/1443 <7.0 (L)  7.0 - 30.0 mg/dL Final   Performed at [facility] [address] [place], [place] [address]   Acetaminophen (Tylenol), Serum 01/20/2022 <10 (L)  10 - 30 ug/mL Final   Comment: (NOTE) Therapeutic concentrations vary significantly. A range of 10-30 ug/mL  may be an effective concentration for many patients. However, some  are best treated at concentrations outside of this range. Acetaminophen concentrations >150 ug/mL at 4 hours after ingestion  and >50 ug/mL at 12 hours after ingestion are often associated with  toxic reactions.  Performed at [facility], [place] [address], [name], [place] [address]    Glucose-Capillary 01/20/2022 344 (H)  70 - 99 mg/dL Final   Glucose reference range applies only to samples taken after fasting for at least 8 hours.   Comment 1 01/20/2022 Notify RN   Final   Comment 2 01/20/2022 Document in Chart   Final   Glucose-Capillary 01/20/2022 479 (H)  70 - 99  mg/dL Final   Glucose reference range applies only to samples taken after fasting for at least 8 hours.   Glucose-Capillary 01/21/2022 243 (H)  70 - 99 mg/dL Final   Glucose reference range applies only to samples taken after fasting for at least 8 hours.   Glucose-Capillary 01/21/2022 224 (H)  70 - 99 mg/dL Final   Glucose reference range applies only to samples taken after fasting for at least 8 hours.   Glucose-Capillary 01/21/2022 282 (H)  70 - 99 mg/dL Final   Glucose reference range applies only to samples taken after fasting for at least 8 hours.  Admission on 01/18/2022, Discharged on 01/19/2022  Component Date Value Ref Range Status   WBC 01/18/2022 5.3  4.0 - 10.5 K/uL Final   RBC 01/18/2022 4.47  4.22 - 5.81 MIL/uL Final   Hemoglobin 01/18/2022 13.7  13.0 - 17.0 g/dL Final   HCT 01/18/2022 41.0  39.0 - 52.0 % Final   MCV 01/18/2022 91.7  80.0 - 100.0 fL Final   MCH 01/18/2022 30.6  26.0 - 34.0 pg Final   MCHC 01/18/2022 33.4  30.0 - 36.0 g/dL Final   RDW 01/18/2022 14.2  11.5 - 15.5 % Final   Platelets 01/18/2022 445 (H)  150 - 400 K/uL Final   nRBC 01/18/2022 0.0  0.0 - 0.2 % Final   Performed at [facility], [place] [address], [place], [place] [address]   Sodium 01/18/2022 136  135 - 145 mmol/L Final   Potassium 01/18/2022 4.3  3.5 - 5.1 mmol/L Final   Chloride 01/18/2022 100  98 - 111 mmol/L Final   CO2 01/18/2022 22  22 - 32 mmol/L Final   Glucose, Bld 01/18/2022 499 (H)  70 - 99 mg/dL Final   Glucose reference range applies only  to samples taken after fasting for at least 8 hours.   BUN 01/18/2022 9  8 - 23 mg/dL Final   Creatinine, Ser 01/18/2022 1.23  0.61 - 1.24 mg/dL Final   Calcium 01/18/2022 9.1  8.9 - 10.3 mg/dL Final   Total Protein 01/18/2022 6.6  6.5 - 8.1 g/dL Final   Albumin 01/18/2022 3.2 (L)  3.5 - 5.0 g/dL Final   AST 01/18/2022 18  15 - 41 U/L Final   ALT 01/18/2022 9  0 - 44 U/L Final   Alkaline Phosphatase 01/18/2022 95  38 - 126 U/L Final    Total Bilirubin 01/18/2022 0.3  0.3 - 1.2 mg/dL Final   GFR, Estimated 01/18/2022 >60  >60 mL/min Final   Comment: (NOTE) Calculated using the CKD-EPI Creatinine Equation (2021)    Anion gap 01/18/2022 14  5 - 15 Final   Performed at [place] [address], [place], [place] [address]   Color, Urine 01/18/2022 YELLOW  YELLOW Final   APPearance 01/18/2022 CLEAR  CLEAR Final   Specific Gravity, Urine 01/18/2022 1.025  1.005 - 1.030 Final   pH 01/18/2022 6.0  5.0 - 8.0 Final   Glucose, UA 01/18/2022 >=500 (A)  NEGATIVE mg/dL Final   Hgb urine dipstick 01/18/2022 NEGATIVE  NEGATIVE Final   Bilirubin Urine 01/18/2022 NEGATIVE  NEGATIVE Final   Ketones, ur 01/18/2022 NEGATIVE  NEGATIVE mg/dL Final   Protein, ur 01/18/2022 NEGATIVE  NEGATIVE mg/dL Final   Nitrite 01/18/2022 NEGATIVE  NEGATIVE Final   Leukocytes,Ua 01/18/2022 SMALL (A)  NEGATIVE Final   RBC / HPF 01/18/2022 0-5  0 - 5 RBC/hpf Final   WBC, UA 01/18/2022 21-50  0 - 5 WBC/hpf Final   Bacteria, UA 01/18/2022 RARE (A)  NONE SEEN Final   Squamous Epithelial / LPF 01/18/2022 0-5  0 - 5 Final   Budding Yeast 01/18/2022 PRESENT   Final   Performed at [facility], [place] [address], [place], [place] [address]   Opiates 01/18/2022 NONE DETECTED  NONE DETECTED Final   Cocaine 01/18/2022 POSITIVE (A)  NONE DETECTED Final   Benzodiazepines 01/18/2022 NONE DETECTED  NONE DETECTED Final   Amphetamines 01/18/2022 NONE DETECTED  NONE DETECTED Final   Tetrahydrocannabinol 01/18/2022 NONE DETECTED  NONE DETECTED Final   Barbiturates 01/18/2022 NONE DETECTED  NONE DETECTED Final   Comment: (NOTE) DRUG SCREEN FOR MEDICAL PURPOSES ONLY.  IF CONFIRMATION IS NEEDED FOR ANY PURPOSE, NOTIFY LAB WITHIN 5 DAYS.  LOWEST DETECTABLE LIMITS FOR URINE DRUG SCREEN Drug Class                     Cutoff (ng/mL) Amphetamine and metabolites    1000 Barbiturate and metabolites    200 Benzodiazepine                 200 Opiates and  metabolites        300 Cocaine and metabolites        300 THC                            50 Performed at [facility], [place] [address], [place], [place] [address]    Glucose-Capillary 01/18/2022 483 (H)  70 - 99 mg/dL Final   Glucose reference range applies only to samples taken after fasting for at least 8 hours.   Glucose-Capillary 01/19/2022 586 (HH)  70 - 99 mg/dL Final   Glucose reference  range applies only to samples taken after fasting for at least 8 hours.   Glucose-Capillary 01/19/2022 456 (H)  70 - 99 mg/dL Final   Glucose reference range applies only to samples taken after fasting for at least 8 hours.   Glucose-Capillary 01/19/2022 515 (HH)  70 - 99 mg/dL Final   Glucose reference range applies only to samples taken after fasting for at least 8 hours.   Comment 1 01/19/2022 Notify RN   Final   Glucose-Capillary 01/19/2022 164 (H)  70 - 99 mg/dL Final   Glucose reference range applies only to samples taken after fasting for at least 8 hours.  Admission on 01/07/2022, Discharged on 01/07/2022  Component Date Value Ref Range Status   Glucose-Capillary 01/07/2022 267 (H)  70 - 99 mg/dL Final   Glucose reference range applies only to samples taken after fasting for at least 8 hours.  Admission on 01/02/2022, Discharged on 01/02/2022  Component Date Value Ref Range Status   WBC 01/02/2022 7.0  4.0 - 10.5 K/uL Final   RBC 01/02/2022 4.54  4.22 - 5.81 MIL/uL Final   Hemoglobin 01/02/2022 13.9  13.0 - 17.0 g/dL Final   HCT 01/02/2022 41.4  39.0 - 52.0 % Final   MCV 01/02/2022 91.2  80.0 - 100.0 fL Final   MCH 01/02/2022 30.6  26.0 - 34.0 pg Final   MCHC 01/02/2022 33.6  30.0 - 36.0 g/dL Final   RDW 01/02/2022 14.1  11.5 - 15.5 % Final   Platelets 01/02/2022 369  150 - 400 K/uL Final   nRBC 01/02/2022 0.0  0.0 - 0.2 % Final   Neutrophils Relative % 01/02/2022 59  % Final   Neutro Abs 01/02/2022 4.1  1.7 - 7.7 K/uL Final   Lymphocytes Relative 01/02/2022 28  % Final   Lymphs  Abs 01/02/2022 1.9  0.7 - 4.0 K/uL Final   Monocytes Relative 01/02/2022 8  % Final   Monocytes Absolute 01/02/2022 0.5  0.1 - 1.0 K/uL Final   Eosinophils Relative 01/02/2022 4  % Final   Eosinophils Absolute 01/02/2022 0.3  0.0 - 0.5 K/uL Final   Basophils Relative 01/02/2022 1  % Final   Basophils Absolute 01/02/2022 0.1  0.0 - 0.1 K/uL Final   Immature Granulocytes 01/02/2022 0  % Final   Abs Immature Granulocytes 01/02/2022 0.02  0.00 - 0.07 K/uL Final   Performed at [facility], [place] [address] [place], [place] [address]   Sodium 01/02/2022 138  135 - 145 mmol/L Final   Potassium 01/02/2022 4.1  3.5 - 5.1 mmol/L Final   Chloride 01/02/2022 104  98 - 111 mmol/L Final   CO2 01/02/2022 25  22 - 32 mmol/L Final   Glucose, Bld 01/02/2022 216 (H)  70 - 99 mg/dL Final   Glucose reference range applies only to samples taken after fasting for at least 8 hours.   BUN 01/02/2022 11  8 - 23 mg/dL Final   Creatinine, Ser 01/02/2022 0.98  0.61 - 1.24 mg/dL Final   Calcium 01/02/2022 9.5  8.9 - 10.3 mg/dL Final   Total Protein 01/02/2022 7.4  6.5 - 8.1 g/dL Final   Albumin 01/02/2022 3.4 (L)  3.5 - 5.0 g/dL Final   AST 01/02/2022 12 (L)  15 - 41 U/L Final   ALT 01/02/2022 11  0 - 44 U/L Final   Alkaline Phosphatase 01/02/2022 90  38 - 126 U/L Final   Total Bilirubin 01/02/2022 0.9  0.3 - 1.2 mg/dL Final  GFR, Estimated 01/02/2022 >60  >60 mL/min Final   Comment: (NOTE) Calculated using the CKD-EPI Creatinine Equation (2021)    Anion gap 01/02/2022 9  5 - 15 Final   Performed at [facility], [place] [address] [place], [place] [address]  [facility] on 11/01/2021  Component Date Value Ref Range Status   POC Glucose 11/27/2021 301 (A)  70 - 99 mg/dl Final   1 hr after lunch, had not taken insulin  Admission on 10/13/2021, Discharged on 10/15/2021  Component Date Value Ref Range Status   Glucose-Capillary 10/13/2021 388 (H)  70 - 99 mg/dL Final   Glucose  reference range applies only to samples taken after fasting for at least 8 hours.   SARS Coronavirus 2 by RT PCR 10/13/2021 NEGATIVE  NEGATIVE Final   Comment: (NOTE) SARS-CoV-2 target nucleic acids are NOT DETECTED.  The SARS-CoV-2 RNA is generally detectable in upper respiratory specimens during the acute phase of infection. The lowest concentration of SARS-CoV-2 viral copies this assay can detect is 138 copies/mL. A negative result does not preclude SARS-Cov-2 infection and should not be used as the sole basis for treatment or other patient management decisions. A negative result may occur with  improper specimen collection/handling, submission of specimen other than nasopharyngeal swab, presence of viral mutation(s) within the areas targeted by this assay, and inadequate number of viral copies(<138 copies/mL). A negative result must be combined with clinical observations, patient history, and epidemiological information. The expected result is Negative.  Fact Sheet for Patients:  EntrepreneurPulse.com.au  Fact Sheet for Healthcare Providers:  IncredibleEmployment.be  This test is no                          t yet approved or cleared by the [place] FDA and  has been authorized for detection and/or diagnosis of SARS-CoV-2 by FDA under an Emergency Use Authorization (EUA). This EUA will remain  in effect (meaning this test can be used) for the duration of the COVID-19 declaration under Section 564(b)(1) of the Act, 21 U.S.C.section 360bbb-3(b)(1), unless the authorization is terminated  or revoked sooner.       Influenza A by PCR 10/13/2021 NEGATIVE  NEGATIVE Final   Influenza B by PCR 10/13/2021 NEGATIVE  NEGATIVE Final   Comment: (NOTE) The Xpert Xpress SARS-CoV-2/FLU/RSV plus assay is intended as an aid in the diagnosis of influenza from Nasopharyngeal swab specimens and should not be used as a sole basis for treatment. Nasal  washings and aspirates are unacceptable for Xpert Xpress SARS-CoV-2/FLU/RSV testing.  Fact Sheet for Patients: EntrepreneurPulse.com.au  Fact Sheet for Healthcare Providers: IncredibleEmployment.be  This test is not yet approved or cleared by the [place] FDA and has been authorized for detection and/or diagnosis of SARS-CoV-2 by FDA under an Emergency Use Authorization (EUA). This EUA will remain in effect (meaning this test can be used) for the duration of the COVID-19 declaration under Section 564(b)(1) of the Act, 21 U.S.C. section 360bbb-3(b)(1), unless the authorization is terminated or revoked.  Performed at [facility], [place] [address] [place], [place] [address]    WBC 10/13/2021 9.5  4.0 - 10.5 K/uL Final   RBC 10/13/2021 4.23  4.22 - 5.81 MIL/uL Final   Hemoglobin 10/13/2021 13.0  13.0 - 17.0 g/dL Final   HCT 10/13/2021 40.4  39.0 - 52.0 % Final   MCV 10/13/2021 95.5  80.0 - 100.0 fL Final   MCH 10/13/2021 30.7  26.0 - 34.0 pg Final   MCHC 10/13/2021 32.2  30.0 - 36.0 g/dL Final   RDW 10/13/2021 13.4  11.5 - 15.5 % Final   Platelets 10/13/2021 551 (H)  150 - 400 K/uL Final   nRBC 10/13/2021 0.0  0.0 - 0.2 % Final   Neutrophils Relative % 10/13/2021 68  % Final   Neutro Abs 10/13/2021 6.5  1.7 - 7.7 K/uL Final   Lymphocytes Relative 10/13/2021 18  % Final   Lymphs Abs 10/13/2021 1.7  0.7 - 4.0 K/uL Final   Monocytes Relative 10/13/2021 9  % Final   Monocytes Absolute 10/13/2021 0.8  0.1 - 1.0 K/uL Final   Eosinophils Relative 10/13/2021 3  % Final   Eosinophils Absolute 10/13/2021 0.3  0.0 - 0.5 K/uL Final   Basophils Relative 10/13/2021 1  % Final   Basophils Absolute 10/13/2021 0.1  0.0 - 0.1 K/uL Final   Immature Granulocytes 10/13/2021 1  % Final   Abs Immature Granulocytes 10/13/2021 0.11 (H)  0.00 - 0.07 K/uL Final   Performed at [facility], [place] [address], [place], [place] [address]   Sodium 10/13/2021  136  135 - 145 mmol/L Final   Potassium 10/13/2021 4.3  3.5 - 5.1 mmol/L Final   Chloride 10/13/2021 104  98 - 111 mmol/L Final   CO2 10/13/2021 24  22 - 32 mmol/L Final   Glucose, Bld 10/13/2021 296 (H)  70 - 99 mg/dL Final   Glucose reference range applies only to samples taken after fasting for at least 8 hours.   BUN 10/13/2021 14  8 - 23 mg/dL Final   Creatinine, Ser 10/13/2021 1.15  0.61 - 1.24 mg/dL Final   Calcium 10/13/2021 9.0  8.9 - 10.3 mg/dL Final   Total Protein 10/13/2021 7.5  6.5 - 8.1 g/dL Final   Albumin 10/13/2021 3.0 (L)  3.5 - 5.0 g/dL Final   AST 10/13/2021 13 (L)  15 - 41 U/L Final   ALT 10/13/2021 10  0 - 44 U/L Final   Alkaline Phosphatase 10/13/2021 84  38 - 126 U/L Final   Total Bilirubin 10/13/2021 0.5  0.3 - 1.2 mg/dL Final   GFR, Estimated 10/13/2021 >60  >60 mL/min Final   Comment: (NOTE) Calculated using the CKD-EPI Creatinine Equation (2021)    Anion gap 10/13/2021 8  5 - 15 Final   Performed at [facility], [place] [address], [place], [place] [address]   Color, Urine 10/13/2021 YELLOW  YELLOW Final   APPearance 10/13/2021 CLOUDY (A)  CLEAR Final   Specific Gravity, Urine 10/13/2021 1.022  1.005 - 1.030 Final   pH 10/13/2021 5.0  5.0 - 8.0 Final   Glucose, UA 10/13/2021 >=500 (A)  NEGATIVE mg/dL Final   Hgb urine dipstick 10/13/2021 LARGE (A)  NEGATIVE Final   Bilirubin Urine 10/13/2021 NEGATIVE  NEGATIVE Final   Ketones, ur 10/13/2021 NEGATIVE  NEGATIVE mg/dL Final   Protein, ur 10/13/2021 30 (A)  NEGATIVE mg/dL Final   Nitrite 10/13/2021 NEGATIVE  NEGATIVE Final   Leukocytes,Ua 10/13/2021 LARGE (A)  NEGATIVE Final   RBC / HPF 10/13/2021 >50 (H)  0 - 5 RBC/hpf Final   WBC, UA 10/13/2021 >50 (H)  0 - 5 WBC/hpf Final   Bacteria, UA 10/13/2021 RARE (A)  NONE SEEN Final   Squamous Epithelial / LPF 10/13/2021 0-5  0 - 5 Final   WBC Clumps 10/13/2021 PRESENT   Final   Mucus 10/13/2021 PRESENT   Final  Budding Yeast 10/13/2021 PRESENT   Final    Non Squamous Epithelial 10/13/2021 0-5 (A)  NONE SEEN Final   Performed at [facility], [place] [address] [place], [place] [address]   pH, Ven 10/13/2021 7.457 (H)  7.25 - 7.43 Final   pCO2, Ven 10/13/2021 38.1 (L)  44 - 60 mmHg Final   pO2, Ven 10/13/2021 174 (H)  32 - 45 mmHg Final   Bicarbonate 10/13/2021 26.9  20.0 - 28.0 mmol/L Final   TCO2 10/13/2021 28  22 - 32 mmol/L Final   O2 Saturation 10/13/2021 100  % Final   Acid-Base Excess 10/13/2021 3.0 (H)  0.0 - 2.0 mmol/L Final   Sodium 10/13/2021 137  135 - 145 mmol/L Final   Potassium 10/13/2021 4.3  3.5 - 5.1 mmol/L Final   Calcium, Ion 10/13/2021 1.10 (L)  1.15 - 1.40 mmol/L Final   HCT 10/13/2021 40.0  39.0 - 52.0 % Final   Hemoglobin 10/13/2021 13.6  13.0 - 17.0 g/dL Final   Sample type 10/13/2021 VENOUS   Final   Opiates 10/13/2021 NONE DETECTED  NONE DETECTED Final   Cocaine 10/13/2021 POSITIVE (A)  NONE DETECTED Final   Benzodiazepines 10/13/2021 NONE DETECTED  NONE DETECTED Final   Amphetamines 10/13/2021 NONE DETECTED  NONE DETECTED Final   Tetrahydrocannabinol 10/13/2021 NONE DETECTED  NONE DETECTED Final   Barbiturates 10/13/2021 NONE DETECTED  NONE DETECTED Final   Comment: (NOTE) DRUG SCREEN FOR MEDICAL PURPOSES ONLY.  IF CONFIRMATION IS NEEDED FOR ANY PURPOSE, NOTIFY LAB WITHIN 5 DAYS.  LOWEST DETECTABLE LIMITS FOR URINE DRUG SCREEN Drug Class                     Cutoff (ng/mL) Amphetamine and metabolites    1000 Barbiturate and metabolites    200 Benzodiazepine                 423 Tricyclics and metabolites     300 Opiates and metabolites        300 Cocaine and metabolites        300 THC                            50 Performed at [facility], [place] [address], [place], [place] [address]    Adenovirus 10/13/2021 NOT DETECTED  NOT DETECTED Final   Coronavirus 229E 10/13/2021 NOT DETECTED  NOT DETECTED Final   Comment: (NOTE) The Coronavirus on the Respiratory Panel, DOES NOT test for the novel   Coronavirus (2019 nCoV)    Coronavirus HKU1 10/13/2021 NOT DETECTED  NOT DETECTED Final   Coronavirus NL63 10/13/2021 NOT DETECTED  NOT DETECTED Final   Coronavirus OC43 10/13/2021 NOT DETECTED  NOT DETECTED Final   Metapneumovirus 10/13/2021 NOT DETECTED  NOT DETECTED Final   Rhinovirus / Enterovirus 10/13/2021 NOT DETECTED  NOT DETECTED Final   Influenza A 10/13/2021 NOT DETECTED  NOT DETECTED Final   Influenza B 10/13/2021 NOT DETECTED  NOT DETECTED Final   Parainfluenza Virus 1 10/13/2021 NOT DETECTED  NOT DETECTED Final   Parainfluenza Virus 2 10/13/2021 NOT DETECTED  NOT DETECTED Final   Parainfluenza Virus 3 10/13/2021 NOT DETECTED  NOT DETECTED Final   Parainfluenza Virus 4 10/13/2021 NOT DETECTED  NOT DETECTED Final   Respiratory Syncytial Virus 10/13/2021 NOT DETECTED  NOT DETECTED Final   Bordetella pertussis 10/13/2021 NOT DETECTED  NOT DETECTED Final   Bordetella Parapertussis 10/13/2021 NOT  DETECTED  NOT DETECTED Final   Chlamydophila pneumoniae 10/13/2021 NOT DETECTED  NOT DETECTED Final   Mycoplasma pneumoniae 10/13/2021 NOT DETECTED  NOT DETECTED Final   Performed at [facility], [place] [address], [place], [place] [address]   Procalcitonin 10/13/2021 <0.10  ng/mL Final   Comment:        Interpretation: PCT (Procalcitonin) <= 0.5 ng/mL: Systemic infection (sepsis) is not likely. Local bacterial infection is possible. (NOTE)       Sepsis PCT Algorithm           Lower Respiratory Tract                                      Infection PCT Algorithm    ----------------------------     ----------------------------         PCT < 0.25 ng/mL                PCT < 0.10 ng/mL          Strongly encourage             Strongly discourage   discontinuation of antibiotics    initiation of antibiotics    ----------------------------     -----------------------------       PCT 0.25 - 0.50 ng/mL            PCT 0.10 - 0.25 ng/mL               OR       >80% decrease in PCT             Discourage initiation of                                            antibiotics      Encourage discontinuation           of antibiotics    ----------------------------     -----------------------------         PCT >= 0.50 ng/mL              PCT 0.26 - 0.50 ng/mL               AND                                 <80% decrease in PCT             Encourage initiation of                                             antibiotics       Encourage continuation           of antibiotics    ----------------------------     -----------------------------        PCT >= 0.50 ng/mL                  PCT > 0.50 ng/mL               AND         increase in PCT  Strongly encourage                                      initiation of antibiotics    Strongly encourage escalation           of antibiotics                                     -----------------------------                                           PCT <= 0.25 ng/mL                                                 OR                                        > 80% decrease in PCT                                      Discontinue / Do not initiate                                             antibiotics  Performed at [facility], [place] [address] [place], [place] [address]    B Natriuretic Peptide 10/13/2021 23.6  0.0 - 100.0 pg/mL Final   Performed at [place] [address] [place], [place] [address]   L. pneumophila Serogp 1 Ur Ag 10/13/2021 Negative  Negative Final   Comment: (NOTE) Presumptive negative for L. pneumophila serogroup 1 antigen in urine, suggesting no recent or current infection. Legionnaires' disease cannot be ruled out since other serogroups and species may also cause disease. Performed At: [facility] [place], [place] [id number] Rush Farmer MD KN:[id number]    Source of Sample 10/13/2021 URINE, RANDOM   Final   Performed at [facility], [place] [address], [place], [place] [address]   Strep Pneumo Urinary Antigen 10/13/2021 NEGATIVE  NEGATIVE Final   Comment:        Infection due to S. pneumoniae cannot be absolutely ruled out since the antigen present may be below the detection limit of the test. Performed at [facility], [address] [place], [place] [address]    Sodium 10/14/2021 138  135 - 145 mmol/L Final   Potassium 10/14/2021 4.3  3.5 - 5.1 mmol/L Final   Chloride 10/14/2021 106  98 - 111 mmol/L Final   CO2 10/14/2021 25  22 - 32 mmol/L Final   Glucose, Bld 10/14/2021 286 (H)  70 - 99 mg/dL Final   Glucose reference range applies only to samples taken after fasting for at least 8 hours.   BUN 10/14/2021 14  8 - 23 mg/dL Final  Creatinine, Ser 10/14/2021 0.98  0.61 - 1.24 mg/dL Final   Calcium 10/14/2021 8.9  8.9 - 10.3 mg/dL Final   GFR, Estimated 10/14/2021 >60  >60 mL/min Final   Comment: (NOTE) Calculated using the CKD-EPI Creatinine Equation (2021)    Anion gap 10/14/2021 7  5 - 15 Final   Performed at [place] [address] [place], [place] [address]   WBC 10/14/2021 9.6  4.0 - 10.5 K/uL Final   RBC 10/14/2021 4.12 (L)  4.22 - 5.81 MIL/uL Final   Hemoglobin 10/14/2021 12.6 (L)  13.0 - 17.0 g/dL Final   HCT 10/14/2021 38.8 (L)  39.0 - 52.0 % Final   MCV 10/14/2021 94.2  80.0 - 100.0 fL Final   MCH 10/14/2021 30.6  26.0 - 34.0 pg Final   MCHC 10/14/2021 32.5  30.0 - 36.0 g/dL Final   RDW 10/14/2021 13.4  11.5 - 15.5 % Final   Platelets 10/14/2021 556 (H)  150 - 400 K/uL Final   nRBC 10/14/2021 0.0  0.0 - 0.2 % Final   Performed at [facility], [place] [address] [place], [place] [address]   Glucose-Capillary 10/13/2021 281 (H)  70 - 99 mg/dL Final   Glucose reference range applies only to samples taken after fasting for at least 8 hours.   Glucose-Capillary 10/13/2021 399 (H)  70 - 99 mg/dL Final   Glucose reference range applies only to samples taken after fasting for at least 8 hours.    Glucose-Capillary 10/14/2021 239 (H)  70 - 99 mg/dL Final   Glucose reference range applies only to samples taken after fasting for at least 8 hours.   Glucose-Capillary 10/14/2021 345 (H)  70 - 99 mg/dL Final   Glucose reference range applies only to samples taken after fasting for at least 8 hours.   Glucose-Capillary 10/14/2021 383 (H)  70 - 99 mg/dL Final   Glucose reference range applies only to samples taken after fasting for at least 8 hours.   Glucose-Capillary 10/15/2021 194 (H)  70 - 99 mg/dL Final   Glucose reference range applies only to samples taken after fasting for at least 8 hours.   Glucose-Capillary 10/15/2021 161 (H)  70 - 99 mg/dL Final   Glucose reference range applies only to samples taken after fasting for at least 8 hours.   Glucose-Capillary 10/15/2021 312 (H)  70 - 99 mg/dL Final   Glucose reference range applies only to samples taken after fasting for at least 8 hours.   Glucose-Capillary 10/14/2021 279 (H)  70 - 99 mg/dL Final   Glucose reference range applies only to samples taken after fasting for at least 8 hours.  Office Visit on 10/08/2021  Component Date Value Ref Range Status   POC Glucose 10/08/2021 321 (A)  70 - 99 mg/dl Final   HbA1c, POC (controlled diabetic ra* 10/08/2021 9.3 (A)  0.0 - 7.0 % Final  Admission on 10/07/2021, Discharged on 10/07/2021  Component Date Value Ref Range Status   Color, Urine 10/07/2021 YELLOW  YELLOW Final   APPearance 10/07/2021 HAZY (A)  CLEAR Final   Specific Gravity, Urine 10/07/2021 1.010  1.005 - 1.030 Final   pH 10/07/2021 5.0  5.0 - 8.0 Final   Glucose, UA 10/07/2021 >=500 (A)  NEGATIVE mg/dL Final   Hgb urine dipstick 10/07/2021 MODERATE (A)  NEGATIVE Final   Bilirubin Urine 10/07/2021 NEGATIVE  NEGATIVE Final   Ketones, ur 10/07/2021 NEGATIVE  NEGATIVE mg/dL Final   Protein, ur  10/07/2021 30 (A)  NEGATIVE mg/dL Final   Nitrite 10/07/2021 NEGATIVE  NEGATIVE Final   Leukocytes,Ua 10/07/2021 LARGE (A)  NEGATIVE  Final   RBC / HPF 10/07/2021 21-50  0 - 5 RBC/hpf Final   WBC, UA 10/07/2021 >50 (H)  0 - 5 WBC/hpf Final   Bacteria, UA 10/07/2021 NONE SEEN  NONE SEEN Final   WBC Clumps 10/07/2021 PRESENT   Final   Mucus 10/07/2021 PRESENT   Final   Budding Yeast 10/07/2021 PRESENT   Final   Performed at [facility], [place] [address], [place], [place] [address]   WBC 10/07/2021 8.9  4.0 - 10.5 K/uL Final   RBC 10/07/2021 4.35  4.22 - 5.81 MIL/uL Final   Hemoglobin 10/07/2021 13.7  13.0 - 17.0 g/dL Final   HCT 10/07/2021 41.9  39.0 - 52.0 % Final   MCV 10/07/2021 96.3  80.0 - 100.0 fL Final   MCH 10/07/2021 31.5  26.0 - 34.0 pg Final   MCHC 10/07/2021 32.7  30.0 - 36.0 g/dL Final   RDW 10/07/2021 13.4  11.5 - 15.5 % Final   Platelets 10/07/2021 376  150 - 400 K/uL Final   nRBC 10/07/2021 0.0  0.0 - 0.2 % Final   Neutrophils Relative % 10/07/2021 59  % Final   Neutro Abs 10/07/2021 5.2  1.7 - 7.7 K/uL Final   Lymphocytes Relative 10/07/2021 17  % Final   Lymphs Abs 10/07/2021 1.5  0.7 - 4.0 K/uL Final   Monocytes Relative 10/07/2021 19  % Final   Monocytes Absolute 10/07/2021 1.7 (H)  0.1 - 1.0 K/uL Final   Eosinophils Relative 10/07/2021 5  % Final   Eosinophils Absolute 10/07/2021 0.5  0.0 - 0.5 K/uL Final   Basophils Relative 10/07/2021 0  % Final   Basophils Absolute 10/07/2021 0.0  0.0 - 0.1 K/uL Final   Immature Granulocytes 10/07/2021 0  % Final   Abs Immature Granulocytes 10/07/2021 0.04  0.00 - 0.07 K/uL Final   Performed at [facility], [place] [address], [place], [place] [address]   Sodium 10/07/2021 141  135 - 145 mmol/L Final   Potassium 10/07/2021 4.0  3.5 - 5.1 mmol/L Final   Chloride 10/07/2021 107  98 - 111 mmol/L Final   CO2 10/07/2021 24  22 - 32 mmol/L Final   Glucose, Bld 10/07/2021 81  70 - 99 mg/dL Final   Glucose reference range applies only to samples taken after fasting for at least 8 hours.   BUN 10/07/2021 14  8 - 23 mg/dL Final    Creatinine, Ser 10/07/2021 1.19  0.61 - 1.24 mg/dL Final   Calcium 10/07/2021 9.4  8.9 - 10.3 mg/dL Final   Total Protein 10/07/2021 7.9  6.5 - 8.1 g/dL Final   Albumin 10/07/2021 3.5  3.5 - 5.0 g/dL Final   AST 10/07/2021 12 (L)  15 - 41 U/L Final   ALT 10/07/2021 10  0 - 44 U/L Final   Alkaline Phosphatase 10/07/2021 73  38 - 126 U/L Final   Total Bilirubin 10/07/2021 0.7  0.3 - 1.2 mg/dL Final   GFR, Estimated 10/07/2021 >60  >60 mL/min Final   Comment: (NOTE) Calculated using the CKD-EPI Creatinine Equation (2021)    Anion gap 10/07/2021 10  5 - 15 Final   Performed at [facility], [place] [address] [place], [place] [address]   Specimen Description 10/07/2021    Final  Value:URINE, CLEAN CATCH Performed at [facility], [place] [address], [place], [place] [address]    Special Requests 10/07/2021    Final                   Value:NONE Performed at [facility], [place] [address], [place], [place] [address]    Culture 10/07/2021  (A)   Final                   Value:40,000 COLONIES/mL YEAST 20,000 COLONIES/mL LACTOBACILLUS SPECIES Standardized susceptibility testing for this organism is not available. Performed at [facility], [place] [address] [place], [place] [address]    Report Status 10/07/2021 10/08/2021 FINAL   Final  Admission on 10/04/2021, Discharged on 10/04/2021  Component Date Value Ref Range Status   WBC 10/04/2021 9.3  4.0 - 10.5 K/uL Final   RBC 10/04/2021 4.26  4.22 - 5.81 MIL/uL Final   Hemoglobin 10/04/2021 13.4  13.0 - 17.0 g/dL Final   HCT 10/04/2021 41.5  39.0 - 52.0 % Final   MCV 10/04/2021 97.4  80.0 - 100.0 fL Final   MCH 10/04/2021 31.5  26.0 - 34.0 pg Final   MCHC 10/04/2021 32.3  30.0 - 36.0 g/dL Final   RDW 10/04/2021 13.6  11.5 - 15.5 % Final   Platelets 10/04/2021 367  150 - 400 K/uL Final   nRBC 10/04/2021 0.0  0.0 - 0.2 % Final   Performed at [facility],  [place] [address] [place], [place] [address]   Sodium 10/04/2021 138  135 - 145 mmol/L Final   Potassium 10/04/2021 4.4  3.5 - 5.1 mmol/L Final   Chloride 10/04/2021 107  98 - 111 mmol/L Final   CO2 10/04/2021 22  22 - 32 mmol/L Final   Glucose, Bld 10/04/2021 236 (H)  70 - 99 mg/dL Final   Glucose reference range applies only to samples taken after fasting for at least 8 hours.   BUN 10/04/2021 13  8 - 23 mg/dL Final   Creatinine, Ser 10/04/2021 1.15  0.61 - 1.24 mg/dL Final   Calcium 10/04/2021 8.9  8.9 - 10.3 mg/dL Final   GFR, Estimated 10/04/2021 >60  >60 mL/min Final   Comment: (NOTE) Calculated using the CKD-EPI Creatinine Equation (2021)    Anion gap 10/04/2021 9  5 - 15 Final   Performed at [facility], [place] [address], [place], [place] [address]  There may be more visits with results that are not included.    Blood Alcohol level:  Lab Results  Component Value Date   ETH <10 01/20/2022   ETH <10 13/10/6576    Metabolic Disorder Labs: Lab Results  Component Value Date   HGBA1C 10.4 (H) 01/21/2022   MPG 251.78 01/21/2022   MPG >398 02/24/2021   No results found for: "PROLACTIN" Lab Results  Component Value Date   CHOL 213 (H) 05/23/2021   TRIG 115 05/23/2021   HDL 71 05/23/2021   CHOLHDL 3.2 11/03/2019   VLDL 32 (H) 05/03/2015   LDLCALC 122 (H) 05/23/2021   LDLCALC 99 11/03/2019    Therapeutic Lab Levels: No results found for: "LITHIUM" No results found for: "VALPROATE" No results found for: "CBMZ"  Physical Findings   GAD-7    Flowsheet Row Office Visit from 10/08/2021 in [place] Office Visit from 09/05/2021 in [place] [facility] Office Visit from 12/26/2020 in [place]  Visit from 11/17/2018 in [place] Office Visit from 06/05/2017 in [place]  Total GAD-7 Score _0 0 12      PHQ2-9     Flowsheet Row ED from 01/21/2022 in [facility] Office Visit from 10/08/2021 in [place] Office Visit from 09/05/2021 in [place] 1 ED from 07/02/2021 in [facility] Office Visit from 12/26/2020 in [place]  PHQ-2 Total Score _1 PHQ-9 Total Score -- _2 Flowsheet Row ED from 01/21/2022 in [facility] ED from 01/20/2022 in [place] ED from 01/18/2022 in [place] CATEGORY Low Risk Low Risk No Risk        Musculoskeletal  Strength & Muscle Tone: within normal limits Gait & Station: normal Patient leans: Front  Psychiatric Specialty Exam  Presentation  General Appearance:  Appropriate for Environment; Casual; Fairly Groomed  Eye Contact: Good  Speech: Clear and Coherent; Normal Rate  Speech Volume: Normal  Handedness: Right   Mood and Affect  Mood: -- (good)  Affect: Appropriate; Congruent; Full Range   Thought Process  Thought Processes: Coherent; Goal Directed  Descriptions of Associations:Circumstantial  Orientation:Full (Time, Place and Person)  Thought Content:Logical  Diagnosis of Schizophrenia or Schizoaffective disorder in past: No    Hallucinations:Hallucinations: None (Denied AVH)   Ideas of Reference:None  Suicidal Thoughts:Suicidal Thoughts: No (Contracted to safety)   Homicidal Thoughts:Homicidal Thoughts: No    Sensorium  Memory: Immediate Good; Recent Fair  Judgment: Fair  Insight: Fair   Community education officer  Concentration: Fair  Attention Span: Fair  Recall: [place] of Knowledge: Fair  Language: Good   Psychomotor Activity  Psychomotor Activity: Psychomotor Activity: Normal    Assets  Assets: Communication Skills; Desire for  Improvement; Resilience   Sleep  Sleep: Sleep: Good   Physical Exam  Physical Exam HENT:     Head: Atraumatic.  Pulmonary:     Effort: Pulmonary effort is normal.  Skin:    General: Skin is warm and dry.  Neurological:     Mental Status: He is alert.    Review of Systems  Respiratory:  Negative for shortness of breath.   Cardiovascular:  Negative for chest pain.  Gastrointestinal:  Negative for abdominal pain and nausea.       Still very hungry, subjectively improving  Genitourinary:        Mild urinary incontinence  Musculoskeletal:  Positive for back pain and joint pain.  Neurological:  Negative for dizziness.   Blood pressure (!) 140/88, pulse 97, temperature 98.1 F (36.7 C), temperature source Temporal, resp. rate 18, SpO2 97 %. There is no height or weight on file to calculate BMI.  Treatment Plan Summary: Daily contact with patient to assess and evaluate symptoms and progress in treatment and Medication management  David Wyatt is a 61 y.o. male, with PMH cocaine use disorder, tobacco use disorder, housing instability, DM2 + neuropathy, HTN, HLD, no inpatient psych admission, no suicide attempts, who initially presented to [place] [facility] (01/20/2022) for passive SI and assistance with rehab for crack cocaine use.  Patient was then IVC by EDP, which was rescinded on 01/21/2022 by Mayra Neer, MD.  He was then admitted Voluntary to [facility] (01/21/2022) via EMS for assistance  with residential placement.      Cocaine use d/o  Tobacco use d/o  Action stage. Encouraged cessation Comfort PRNs    Substance induced mood d/o Continued home Cymbalta 60 mg daily  Chronic Pain - Continued gabapentin 670m TID - Continued PRN Tylenol   DM2-insulin-dependent with neuropathy, IMPROVING Urge Incontinence  BPH A1c 10.4 (01/21/2022), from 9.7 earlier this year. Goal CBGs <180. CBGs mostly <180 with no hypoglycemic events.  After educating on uncontrolled DM2, patient continues to work  diligently on diet, self administering insulin and foot exam. In regards to urge incontinence patient has been able to make it to bathroom every time during this stay. Urge incontinence has been improving with better glycemic control.  Continued home metformin 1000 mg twice daily Continued home glipizide 10 mg with breakfast Continued home empagliflozin 10 mg with breakfast Continued home long acting insulin, self administered - 24 units daily  Continued sensitive sliding scale  Continued short acting 3 units BID for hypoglycemia per above (home dose was 5 units BID) Continued Gabapentin 600 mg TID and Cymbalta per above for neuropathy Educated on self foot exam twice daily Increased home tamsulosin 0.417mdaily to 0.49m40maily   Increased appetite Hx of GERD Improving, still hungry Continue pepcid 24m77mD  Considerations for follow-up: Recommend high risk screening every 6-12 months with HIV, RPR, hepatitis panel PCP for insulin dependent DM2 - uncontrolled [facility]   DISPO: Tentative date: 11/27 [place]  [facility] or [employer] to needles required for insulin dependency   PGY-2 JuliMerrily Brittle 01/30/2022 7:10 PM

## 2022-01-30 NOTE — ED Notes (Signed)
Patient  sleeping in no acute stress. RR even and unlabored .Environment secured .Will continue to monitor for safely.

## 2022-01-30 NOTE — ED Notes (Signed)
Pt sitting in dining room watching TV. A&O x4, calm and cooperative. Denies current SI/HI/AVH. No signs of distress noted. Monitoring for safety.

## 2022-01-30 NOTE — ED Notes (Signed)
Pt has c/o of kidney stones

## 2022-01-31 DIAGNOSIS — E785 Hyperlipidemia, unspecified: Secondary | ICD-10-CM | POA: Diagnosis not present

## 2022-01-31 DIAGNOSIS — F1410 Cocaine abuse, uncomplicated: Secondary | ICD-10-CM | POA: Diagnosis not present

## 2022-01-31 DIAGNOSIS — Z720 Tobacco use: Secondary | ICD-10-CM | POA: Diagnosis not present

## 2022-01-31 DIAGNOSIS — I10 Essential (primary) hypertension: Secondary | ICD-10-CM | POA: Diagnosis not present

## 2022-01-31 DIAGNOSIS — F1420 Cocaine dependence, uncomplicated: Secondary | ICD-10-CM | POA: Diagnosis not present

## 2022-01-31 LAB — GLUCOSE, CAPILLARY
Glucose-Capillary: 138 mg/dL — ABNORMAL HIGH (ref 70–99)
Glucose-Capillary: 193 mg/dL — ABNORMAL HIGH (ref 70–99)
Glucose-Capillary: 274 mg/dL — ABNORMAL HIGH (ref 70–99)
Glucose-Capillary: 320 mg/dL — ABNORMAL HIGH (ref 70–99)

## 2022-01-31 MED ORDER — TRAZODONE HCL 100 MG PO TABS
100.0000 mg | ORAL_TABLET | Freq: Every evening | ORAL | Status: DC | PRN
  Administered 2022-01-31 – 2022-02-05 (×6): 100 mg via ORAL
  Filled 2022-01-31 (×5): qty 1
  Filled 2022-01-31: qty 7
  Filled 2022-01-31: qty 1

## 2022-01-31 MED ORDER — GABAPENTIN 300 MG PO CAPS
600.0000 mg | ORAL_CAPSULE | Freq: Three times a day (TID) | ORAL | Status: DC
  Administered 2022-01-31 – 2022-02-06 (×17): 600 mg via ORAL
  Filled 2022-01-31 (×17): qty 2

## 2022-01-31 MED ORDER — FAMOTIDINE 10 MG PO TABS
10.0000 mg | ORAL_TABLET | Freq: Two times a day (BID) | ORAL | Status: DC
  Administered 2022-01-31 – 2022-02-06 (×12): 10 mg via ORAL
  Filled 2022-01-31 (×12): qty 1

## 2022-01-31 MED ORDER — LUBRIDERM SERIOUSLY SENSITIVE EX LOTN
1.0000 | TOPICAL_LOTION | Freq: Two times a day (BID) | CUTANEOUS | Status: DC
  Administered 2022-01-31 – 2022-02-06 (×10): 1 via TOPICAL
  Filled 2022-01-31: qty 562

## 2022-01-31 MED ORDER — INSULIN ASPART 100 UNIT/ML IJ SOLN
0.0000 [IU] | Freq: Every day | INTRAMUSCULAR | Status: DC
  Administered 2022-01-31: 4 [IU] via SUBCUTANEOUS
  Administered 2022-02-04: 2 [IU] via SUBCUTANEOUS

## 2022-01-31 MED ORDER — INSULIN ASPART 100 UNIT/ML IJ SOLN: 0.0000 [IU] | Freq: Three times a day (TID) | INTRAMUSCULAR | Status: DC

## 2022-01-31 NOTE — ED Notes (Signed)
Pt sleeping@this time. Breathing even and unlabored. Will continue to monitor for safety 

## 2022-01-31 NOTE — ED Notes (Addendum)
Patient sitting in dayroom. Respirations equal and unlabored, skin warm and dry, NAD. No change in assessment or acuity. Q 15 minute safety checks remain in place.

## 2022-01-31 NOTE — ED Provider Notes (Signed)
Behavioral Health Progress Note  Date and Time: 01/31/2022 11:33 AM Name: David Wyatt MRN:  [id number]  Subjective:   David Wyatt is a 61 y.o. male, with PMH cocaine use disorder, tobacco use disorder, housing instability, DM2 + neuropathy, HTN, HLD, no inpatient psych admission, no suicide attempts, who initially presented to [facility] (01/20/2022) for passive SI and assistance with rehab for crack cocaine use. Patient was then IVC by EDP, which was rescinded on 01/21/2022 by Mayra Neer, MD.  He was then admitted Voluntary to [facility] (01/21/2022) via EMS for assistance with residential placement.     On assessment this a.m. patient reports that he is doing "good."  Patient reports that he is feeling better than he did over the weekend and endorses that he is very appreciative for the treatment that he has received.  Patient reports that yesterday he did get a bit upset due to an interaction with a staff member however, he reports that he had a conversation with the social worker endorsing that he finds it extremely beneficial.  Patient reports that he is apologetic for his behavior and endorses that he is working on self-control.  Patient reports that he understands that the behaviors that he uses when he is "on the streets" do not translate appropriately in this facility.  Patient endorses recognizing that he will have to make changes in his own behaviors in order to break his cycle of use and homelessness.  Patient reports that he will start by using coping skills such as walking away and taking the time to reflect on the situation.  Patient reports that although it is difficult for him to adhere to his diet, and admits that at times he would try to sneak snacks he understands that the diet is for his benefit and he will continue to work to maintain what is being recommended.  Patient reports that overall he slept well but does endorse that he feels like it takes a significant amount of time to fall asleep.   Patient reports that sometimes he will have negative thoughts keeping him up however, he has realized the last few nights that his last thought before falling asleep is more positive.  Patient denies any SI, HI and AVH.  Diagnosis:  Final diagnoses:  Dermoid cyst of skin of back  Tobacco abuse  Diabetic polyneuropathy associated with type 2 diabetes mellitus (HCC)  Cocaine use disorder, severe, dependence ([place])    Total Time spent with patient: 20 minutes  Past Psychiatric History: See H&P Past Medical History:  Past Medical History:  Diagnosis Date   Angioedema of lips 07/28/2012   left upper (07/29/2012)   Arthritis    hands and back   Chronic back pain    Cocaine abuse ([place])    Diabetic neuropathy (HCC)    High cholesterol    Hypertension    Neuropathy    Type II diabetes mellitus ([place])     Past Surgical History:  Procedure Laterality Date   BACK SURGERY     CYSTOSCOPY W/ URETERAL STENT PLACEMENT Right 02/23/2021   Procedure: CYSTOSCOPY WITH RETROGRADE PYELOGRAM/URETERAL STENT PLACEMENT;  Surgeon: Janith Lima, MD;  Location: [facility];  Service: Urology;  Laterality: Right;   HERNIA REPAIR Right 04/01/2012   I & D EXTREMITY Left 12/13/2012   Procedure: IRRIGATION AND DEBRIDEMENT LEFT THUMB;  Surgeon: Tennis Must, MD;  Location: [facility];  Service: Orthopedics;  Laterality: Left;   INCISION AND DRAINAGE OF WOUND     boil  on back/notes 07/14/2008 (07/29/2012)   INGUINAL HERNIA REPAIR  04/01/2012   Procedure: HERNIA REPAIR INGUINAL ADULT;  Surgeon: Haywood Lasso, MD;  Location: [facility];  Service: General;  Laterality: Right;   INGUINAL HERNIA REPAIR Left 09/06/2016   Procedure: OPEN REPAIR LEFT INGUINAL HERNIA;  Surgeon: Greer Pickerel, MD;  Location: [facility];  Service: General;  Laterality: Left;   INSERTION OF MESH Left 09/06/2016   Procedure: INSERTION OF MESH;  Surgeon: Greer Pickerel, MD;  Location: [facility];  Service: General;  Laterality: Left;   TONSILLECTOMY     Family History:   Family History  Problem Relation Age of Onset   Diabetes Mother    Kidney disease Mother    Hyperlipidemia Mother    Hyperlipidemia Father    Diabetes Father    Family Psychiatric  History: See H&P Social History:  Social History   Substance and Sexual Activity  Alcohol Use No   Alcohol/week: 0.0 standard drinks of alcohol     Social History   Substance and Sexual Activity  Drug Use Not Currently   Types: "Crack" cocaine    Social History   Socioeconomic History   Marital status: Divorced    Spouse name: Not on file   Number of children: 3   Years of education: 12   Highest education level: Not on file  Occupational History    Comment: disabled  Tobacco Use   Smoking status: Every Day    Packs/day: 0.25    Years: 30.00    Total pack years: 7.50    Types: Cigarettes   Smokeless tobacco: Former    Quit date: 08/15/2015   Tobacco comments:    04/29/16 2  cigs daily, 10/27/17 sometimes < .25 PPD  Vaping Use   Vaping Use: Never used  Substance and Sexual Activity   Alcohol use: No    Alcohol/week: 0.0 standard drinks of alcohol   Drug use: Not Currently    Types: "Crack" cocaine   Sexual activity: Not on file  Other Topics Concern   Not on file  Social History Narrative   04/29/17   Lives in shelter   Caffeine- a lot of  tea, coffee   Social Determinants of Health   Financial Resource Strain: Not on file  Food Insecurity: Not on file  Transportation Needs: Not on file  Physical Activity: Not on file  Stress: Not on file  Social Connections: Not on file   SDOH:  SDOH Screenings   Depression (PHQ2-9): Low Risk  (01/22/2022)  Tobacco Use: High Risk (01/23/2022)   Additional Social History:                         Sleep: Good  Appetite:  Good  Current Medications:  Current Facility-Administered Medications  Medication Dose Route Frequency Provider Last Rate Last Admin   acetaminophen (TYLENOL) tablet 650 mg  650 mg Oral Q6H PRN Merrily Brittle, DO   650 mg at 01/29/22 1841   alum & mag hydroxide-simeth (MAALOX/MYLANTA) 200-200-20 MG/5ML suspension 30 mL  30 mL Oral Q4H PRN Merrily Brittle, DO       amLODipine (NORVASC) tablet 10 mg  10 mg Oral Daily Merrily Brittle, DO   10 mg at 01/31/22 0942   atorvastatin (LIPITOR) tablet 40 mg  40 mg Oral Daily Merrily Brittle, DO   40 mg at 01/31/22 0942   DULoxetine (CYMBALTA) DR capsule 60 mg  60 mg Oral Daily Merrily Brittle, DO  60 mg at 01/31/22 0942   empagliflozin (JARDIANCE) tablet 10 mg  10 mg Oral QAC breakfast Merrily Brittle, DO   10 mg at 01/31/22 4742   famotidine (PEPCID) tablet 10 mg  10 mg Oral BID Damita Dunnings B, MD   10 mg at 01/31/22 0941   feeding supplement (GLUCERNA SHAKE) (GLUCERNA SHAKE) liquid 237 mL  237 mL Oral TID BM Merrily Brittle, DO   237 mL at 01/31/22 0946   gabapentin (NEURONTIN) capsule 600 mg  600 mg Oral TID Merrily Brittle, DO   600 mg at 01/31/22 0942   glipiZIDE (GLUCOTROL XL) 24 hr tablet 10 mg  10 mg Oral Q breakfast Merrily Brittle, DO   10 mg at 01/31/22 5956   insulin aspart (novoLOG) injection 0-6 Units  0-6 Units Subcutaneous TID WC Merrily Brittle, DO   2 Units at 01/30/22 1631   insulin aspart (novoLOG) injection 3 Units  3 Units Subcutaneous BID AC Merrily Brittle, DO   3 Units at 01/30/22 1631   insulin glargine-yfgn (SEMGLEE) injection 24 Units  24 Units Subcutaneous Daily Minda Ditto, RPH   24 Units at 01/31/22 3875   lubriderm seriously sensitive lotion 1 Application  1 Application Topical BID Merrily Brittle, DO   1 Application at 64/33/29 0946   magnesium hydroxide (MILK OF MAGNESIA) suspension 30 mL  30 mL Oral Daily PRN Merrily Brittle, DO       metFORMIN (GLUCOPHAGE) tablet 1,000 mg  1,000 mg Oral BID WC Merrily Brittle, DO   1,000 mg at 01/31/22 5188   nicotine (NICODERM CQ - dosed in mg/24 hours) patch 21 mg  21 mg Transdermal Q0600 Merrily Brittle, DO   21 mg at 01/31/22 0554   nicotine polacrilex (NICORETTE) gum 4 mg  4 mg Oral PRN Merrily Brittle, DO        tamsulosin [facility]) capsule 0.8 mg  0.8 mg Oral Daily Merrily Brittle, DO   0.8 mg at 01/31/22 0941   traZODone (DESYREL) tablet 100 mg  100 mg Oral QHS PRN Freida Busman, MD       Current Outpatient Medications  Medication Sig Dispense Refill   Accu-Chek Softclix Lancets lancets Use up to 4 times daily as directed 100 each 0   amLODipine (NORVASC) 10 MG tablet Take 1 tablet (10 mg total) by mouth daily. 30 tablet 0   atorvastatin (LIPITOR) 40 MG tablet Take 1 tablet (40 mg total) by mouth daily. 30 tablet 0   Blood Glucose Monitoring Suppl (ACCU-CHEK GUIDE) w/Device KIT USE AS DIRECTED TO CHECK BLOOD SUGARS UP TO TWICE PER DAY 1 kit 0   DULoxetine (CYMBALTA) 60 MG capsule Take 1 capsule (60 mg total) by mouth daily. 30 capsule 0   empagliflozin (JARDIANCE) 10 MG TABS tablet Take 1 tablet (10 mg total) by mouth daily before breakfast. 30 tablet 0   gabapentin (NEURONTIN) 300 MG capsule Take 2 capsules (600 mg total) by mouth 3 (three) times daily. 180 capsule 0   glipiZIDE (GLUCOTROL XL) 10 MG 24 hr tablet Take 1 tablet (10 mg total) by mouth daily with breakfast. 30 tablet 0   glucose blood (ACCU-CHEK GUIDE) test strip Use up to 3 times daily as directed 100 each 3   Insulin Glargine Solostar (LANTUS) 100 UNIT/ML Solostar Pen Inject 24 Units into the skin daily. 15 mL 2   insulin lispro (HUMALOG) 100 UNIT/ML KwikPen Inject 5 Units into the skin 2 (two) times daily before lunch and supper. (Patient not taking:  Reported on 01/20/2022) 6 mL 6   Insulin Pen Needle 31G X 8 MM MISC Use with insulin pen 100 each 1   metFORMIN (GLUCOPHAGE) 1000 MG tablet Take 1 tablet (1,000 mg total) by mouth 2 (two) times daily. 60 tablet 0   tamsulosin (FLOMAX) 0.4 MG CAPS capsule Take 1 capsule (0.4 mg total) by mouth daily. For renal calculi 30 capsule 0    Labs  Lab Results:  Admission on 01/21/2022  Component Date Value Ref Range Status   Hgb A1c MFr Bld 01/21/2022 10.4 (H)  4.8 - 5.6 % Final   Comment:  (NOTE) Pre diabetes:          5.7%-6.4%  Diabetes:              >6.4%  Glycemic control for   <7.0% adults with diabetes    Mean Plasma Glucose 01/21/2022 251.78  mg/dL Final   Performed at [facility], [place] [address] [place], [place] [address]   TSH 01/21/2022 0.248 (L)  0.350 - 4.500 uIU/mL Final   Comment: Performed by a 3rd Generation assay with a functional sensitivity of <=0.01 uIU/mL. Performed at [facility], [place] [address], [place], [place] [address]    Glucose-Capillary 01/21/2022 374 (H)  70 - 99 mg/dL Final   Glucose reference range applies only to samples taken after fasting for at least 8 hours.   Glucose-Capillary 01/21/2022 289 (H)  70 - 99 mg/dL Final   Glucose reference range applies only to samples taken after fasting for at least 8 hours.   Glucose-Capillary 01/22/2022 137 (H)  70 - 99 mg/dL Final   Glucose reference range applies only to samples taken after fasting for at least 8 hours.   Glucose-Capillary 01/22/2022 297 (H)  70 - 99 mg/dL Final   Glucose reference range applies only to samples taken after fasting for at least 8 hours.   T3, Free 01/23/2022 2.2  2.0 - 4.4 pg/mL Final   Comment: (NOTE) Performed At: [facility] [place], [place] [id number] Rush Farmer MD UL:[id number]    Glucose-Capillary 01/22/2022 94  70 - 99 mg/dL Final   Glucose reference range applies only to samples taken after fasting for at least 8 hours.   Free T4 01/23/2022 0.72  0.61 - 1.12 ng/dL Final   Comment: (NOTE) Biotin ingestion may interfere with free T4 tests. If the results are inconsistent with the TSH level, previous test results, or the clinical presentation, then consider biotin interference. If needed, order repeat testing after stopping biotin. Performed at [facility], [place] [address] [place], [place] [address]    TSH 01/23/2022 0.203 (L)  0.350 - 4.500 uIU/mL Final   Comment: Performed by a 3rd Generation assay  with a functional sensitivity of <=0.01 uIU/mL. Performed at [facility], [place] [address] [place], [place] [address]    Glucose-Capillary 01/23/2022 132 (H)  70 - 99 mg/dL Final   Glucose reference range applies only to samples taken after fasting for at least 8 hours.   Glucose-Capillary 01/22/2022 59 (L)  70 - 99 mg/dL Final   Glucose reference range applies only to samples taken after fasting for at least 8 hours.   Glucose-Capillary 01/22/2022 177 (H)  70 - 99 mg/dL Final   Glucose reference range applies only to samples taken after fasting for at least 8 hours.   Glucose-Capillary 01/23/2022 284 (H)  70 - 99 mg/dL Final   Glucose reference  range applies only to samples taken after fasting for at least 8 hours.   Glucose-Capillary 01/23/2022 188 (H)  70 - 99 mg/dL Final   Glucose reference range applies only to samples taken after fasting for at least 8 hours.   Glucose-Capillary 01/23/2022 294 (H)  70 - 99 mg/dL Final   Glucose reference range applies only to samples taken after fasting for at least 8 hours.   Glucose-Capillary 01/24/2022 180 (H)  70 - 99 mg/dL Final   Glucose reference range applies only to samples taken after fasting for at least 8 hours.   Glucose-Capillary 01/24/2022 275 (H)  70 - 99 mg/dL Final   Glucose reference range applies only to samples taken after fasting for at least 8 hours.   Glucose-Capillary 01/24/2022 146 (H)  70 - 99 mg/dL Final   Glucose reference range applies only to samples taken after fasting for at least 8 hours.   Glucose-Capillary 01/24/2022 137 (H)  70 - 99 mg/dL Final   Glucose reference range applies only to samples taken after fasting for at least 8 hours.   Glucose-Capillary 01/25/2022 201 (H)  70 - 99 mg/dL Final   Glucose reference range applies only to samples taken after fasting for at least 8 hours.   Glucose-Capillary 01/25/2022 195 (H)  70 - 99 mg/dL Final   Glucose reference range applies only to samples taken after  fasting for at least 8 hours.   Glucose-Capillary 01/25/2022 149 (H)  70 - 99 mg/dL Final   Glucose reference range applies only to samples taken after fasting for at least 8 hours.   Glucose-Capillary 01/25/2022 147 (H)  70 - 99 mg/dL Final   Glucose reference range applies only to samples taken after fasting for at least 8 hours.   Glucose-Capillary 01/26/2022 123 (H)  70 - 99 mg/dL Final   Glucose reference range applies only to samples taken after fasting for at least 8 hours.   Glucose-Capillary 01/26/2022 176 (H)  70 - 99 mg/dL Final   Glucose reference range applies only to samples taken after fasting for at least 8 hours.   Glucose-Capillary 01/26/2022 84  70 - 99 mg/dL Final   Glucose reference range applies only to samples taken after fasting for at least 8 hours.   Glucose-Capillary 01/26/2022 177 (H)  70 - 99 mg/dL Final   Glucose reference range applies only to samples taken after fasting for at least 8 hours.   Glucose-Capillary 01/27/2022 150 (H)  70 - 99 mg/dL Final   Glucose reference range applies only to samples taken after fasting for at least 8 hours.   Glucose-Capillary 01/27/2022 171 (H)  70 - 99 mg/dL Final   Glucose reference range applies only to samples taken after fasting for at least 8 hours.   Glucose-Capillary 01/27/2022 159 (H)  70 - 99 mg/dL Final   Glucose reference range applies only to samples taken after fasting for at least 8 hours.   Glucose-Capillary 01/27/2022 67 (L)  70 - 99 mg/dL Final   Glucose reference range applies only to samples taken after fasting for at least 8 hours.   Glucose-Capillary 01/27/2022 107 (H)  70 - 99 mg/dL Final   Glucose reference range applies only to samples taken after fasting for at least 8 hours.   Glucose-Capillary 01/27/2022 144 (H)  70 - 99 mg/dL Final   Glucose reference range applies only to samples taken after fasting for at least 8 hours.   Glucose-Capillary 01/28/2022 175 (H)  70 - 99 mg/dL Final   Glucose  reference range applies only to samples taken after fasting for at least 8 hours.   Glucose-Capillary 01/28/2022 198 (H)  70 - 99 mg/dL Final   Glucose reference range applies only to samples taken after fasting for at least 8 hours.   Glucose-Capillary 01/28/2022 186 (H)  70 - 99 mg/dL Final   Glucose reference range applies only to samples taken after fasting for at least 8 hours.   Glucose-Capillary 01/28/2022 198 (H)  70 - 99 mg/dL Final   Glucose reference range applies only to samples taken after fasting for at least 8 hours.   Glucose-Capillary 01/29/2022 111 (H)  70 - 99 mg/dL Final   Glucose reference range applies only to samples taken after fasting for at least 8 hours.   Glucose-Capillary 01/29/2022 160 (H)  70 - 99 mg/dL Final   Glucose reference range applies only to samples taken after fasting for at least 8 hours.   Glucose-Capillary 01/29/2022 209 (H)  70 - 99 mg/dL Final   Glucose reference range applies only to samples taken after fasting for at least 8 hours.   Glucose-Capillary 01/29/2022 155 (H)  70 - 99 mg/dL Final   Glucose reference range applies only to samples taken after fasting for at least 8 hours.   Glucose-Capillary 01/30/2022 157 (H)  70 - 99 mg/dL Final   Glucose reference range applies only to samples taken after fasting for at least 8 hours.   Glucose-Capillary 01/30/2022 168 (H)  70 - 99 mg/dL Final   Glucose reference range applies only to samples taken after fasting for at least 8 hours.   Glucose-Capillary 01/30/2022 217 (H)  70 - 99 mg/dL Final   Glucose reference range applies only to samples taken after fasting for at least 8 hours.   Glucose-Capillary 01/30/2022 246 (H)  70 - 99 mg/dL Final   Glucose reference range applies only to samples taken after fasting for at least 8 hours.   Glucose-Capillary 01/30/2022 287 (H)  70 - 99 mg/dL Final   Glucose reference range applies only to samples taken after fasting for at least 8 hours.    Glucose-Capillary 01/31/2022 138 (H)  70 - 99 mg/dL Final   Glucose reference range applies only to samples taken after fasting for at least 8 hours.  Admission on 01/20/2022, Discharged on 01/21/2022  Component Date Value Ref Range Status   SARS Coronavirus 2 by RT PCR 01/20/2022 NEGATIVE  NEGATIVE Final   Comment: (NOTE) SARS-CoV-2 target nucleic acids are NOT DETECTED.  The SARS-CoV-2 RNA is generally detectable in upper respiratory specimens during the acute phase of infection. The lowest concentration of SARS-CoV-2 viral copies this assay can detect is 138 copies/mL. A negative result does not preclude SARS-Cov-2 infection and should not be used as the sole basis for treatment or other patient management decisions. A negative result may occur with  improper specimen collection/handling, submission of specimen other than nasopharyngeal swab, presence of viral mutation(s) within the areas targeted by this assay, and inadequate number of viral copies(<138 copies/mL). A negative result must be combined with clinical observations, patient history, and epidemiological information. The expected result is Negative.  Fact Sheet for Patients:  EntrepreneurPulse.com.au  Fact Sheet for Healthcare Providers:  IncredibleEmployment.be  This test is no                          t yet approved or cleared by the  [place] Architectural technologist and  has been authorized for detection and/or diagnosis of SARS-CoV-2 by FDA under an Print production planner (EUA). This EUA will remain  in effect (meaning this test can be used) for the duration of the COVID-19 declaration under Section 564(b)(1) of the Act, 21 U.S.C.section 360bbb-3(b)(1), unless the authorization is terminated  or revoked sooner.       Influenza A by PCR 01/20/2022 NEGATIVE  NEGATIVE Final   Influenza B by PCR 01/20/2022 NEGATIVE  NEGATIVE Final   Comment: (NOTE) The Xpert Xpress SARS-CoV-2/FLU/RSV plus  assay is intended as an aid in the diagnosis of influenza from Nasopharyngeal swab specimens and should not be used as a sole basis for treatment. Nasal washings and aspirates are unacceptable for Xpert Xpress SARS-CoV-2/FLU/RSV testing.  Fact Sheet for Patients: EntrepreneurPulse.com.au  Fact Sheet for Healthcare Providers: IncredibleEmployment.be  This test is not yet approved or cleared by the [place] FDA and has been authorized for detection and/or diagnosis of SARS-CoV-2 by FDA under an Emergency Use Authorization (EUA). This EUA will remain in effect (meaning this test can be used) for the duration of the COVID-19 declaration under Section 564(b)(1) of the Act, 21 U.S.C. section 360bbb-3(b)(1), unless the authorization is terminated or revoked.  Performed at [facility], [place] [address] [place], [place] [address]    Sodium 01/20/2022 136  135 - 145 mmol/L Final   Potassium 01/20/2022 4.0  3.5 - 5.1 mmol/L Final   Chloride 01/20/2022 103  98 - 111 mmol/L Final   CO2 01/20/2022 24  22 - 32 mmol/L Final   Glucose, Bld 01/20/2022 360 (H)  70 - 99 mg/dL Final   Glucose reference range applies only to samples taken after fasting for at least 8 hours.   BUN 01/20/2022 12  8 - 23 mg/dL Final   Creatinine, Ser 01/20/2022 1.01  0.61 - 1.24 mg/dL Final   Calcium 01/20/2022 9.4  8.9 - 10.3 mg/dL Final   Total Protein 01/20/2022 7.3  6.5 - 8.1 g/dL Final   Albumin 01/20/2022 3.4 (L)  3.5 - 5.0 g/dL Final   AST 01/20/2022 14 (L)  15 - 41 U/L Final   ALT 01/20/2022 10  0 - 44 U/L Final   Alkaline Phosphatase 01/20/2022 101  38 - 126 U/L Final   Total Bilirubin 01/20/2022 0.3  0.3 - 1.2 mg/dL Final   GFR, Estimated 01/20/2022 >60  >60 mL/min Final   Comment: (NOTE) Calculated using the CKD-EPI Creatinine Equation (2021)    Anion gap 01/20/2022 9  5 - 15 Final   Performed at [facility] [address] [place], [place] [address]    Alcohol, Ethyl (B) 01/20/2022 <10  <10 mg/dL Final   Comment: (NOTE) Lowest detectable limit for serum alcohol is 10 mg/dL.  For medical purposes only. Performed at [facility], [place] [address], [place], [place] [address]    Opiates 01/20/2022 NONE DETECTED  NONE DETECTED Final   Cocaine 01/20/2022 POSITIVE (A)  NONE DETECTED Final   Benzodiazepines 01/20/2022 NONE DETECTED  NONE DETECTED Final   Amphetamines 01/20/2022 NONE DETECTED  NONE DETECTED Final   Tetrahydrocannabinol 01/20/2022 NONE DETECTED  NONE DETECTED Final   Barbiturates 01/20/2022 NONE DETECTED  NONE DETECTED Final   Comment: (NOTE) DRUG SCREEN FOR MEDICAL PURPOSES ONLY.  IF CONFIRMATION IS NEEDED FOR ANY PURPOSE, NOTIFY LAB WITHIN 5 DAYS.  LOWEST DETECTABLE LIMITS FOR URINE DRUG SCREEN Drug Class  Cutoff (ng/mL) Amphetamine and metabolites    1000 Barbiturate and metabolites    200 Benzodiazepine                 200 Opiates and metabolites        300 Cocaine and metabolites        300 THC                            50 Performed at [facility], [place] [address] [place], [place] [address]    WBC 01/20/2022 5.2  4.0 - 10.5 K/uL Final   RBC 01/20/2022 4.73  4.22 - 5.81 MIL/uL Final   Hemoglobin 01/20/2022 14.3  13.0 - 17.0 g/dL Final   HCT 01/20/2022 43.1  39.0 - 52.0 % Final   MCV 01/20/2022 91.1  80.0 - 100.0 fL Final   MCH 01/20/2022 30.2  26.0 - 34.0 pg Final   MCHC 01/20/2022 33.2  30.0 - 36.0 g/dL Final   RDW 01/20/2022 14.1  11.5 - 15.5 % Final   Platelets 01/20/2022 444 (H)  150 - 400 K/uL Final   nRBC 01/20/2022 0.0  0.0 - 0.2 % Final   Neutrophils Relative % 01/20/2022 61  % Final   Neutro Abs 01/20/2022 3.2  1.7 - 7.7 K/uL Final   Lymphocytes Relative 01/20/2022 29  % Final   Lymphs Abs 01/20/2022 1.5  0.7 - 4.0 K/uL Final   Monocytes Relative 01/20/2022 7  % Final   Monocytes Absolute 01/20/2022 0.4  0.1 - 1.0 K/uL Final   Eosinophils Relative 01/20/2022 2  %  Final   Eosinophils Absolute 01/20/2022 0.1  0.0 - 0.5 K/uL Final   Basophils Relative 01/20/2022 1  % Final   Basophils Absolute 01/20/2022 0.0  0.0 - 0.1 K/uL Final   Immature Granulocytes 01/20/2022 0  % Final   Abs Immature Granulocytes 01/20/2022 0.01  0.00 - 0.07 K/uL Final   Performed at [facility], [place] [address], [place], [place] [address]   Salicylate Lvl 53/61/4431 <7.0 (L)  7.0 - 30.0 mg/dL Final   Performed at [facility] [address] [place], [place] [address]   Acetaminophen (Tylenol), Serum 01/20/2022 <10 (L)  10 - 30 ug/mL Final   Comment: (NOTE) Therapeutic concentrations vary significantly. A range of 10-30 ug/mL  may be an effective concentration for many patients. However, some  are best treated at concentrations outside of this range. Acetaminophen concentrations >150 ug/mL at 4 hours after ingestion  and >50 ug/mL at 12 hours after ingestion are often associated with  toxic reactions.  Performed at [facility], [place] [address] [place], [place] [address]    Glucose-Capillary 01/20/2022 344 (H)  70 - 99 mg/dL Final   Glucose reference range applies only to samples taken after fasting for at least 8 hours.   Comment 1 01/20/2022 Notify RN   Final   Comment 2 01/20/2022 Document in Chart   Final   Glucose-Capillary 01/20/2022 479 (H)  70 - 99 mg/dL Final   Glucose reference range applies only to samples taken after fasting for at least 8 hours.   Glucose-Capillary 01/21/2022 243 (H)  70 - 99 mg/dL Final   Glucose reference range applies only to samples taken after fasting for at least 8 hours.   Glucose-Capillary 01/21/2022 224 (H)  70 - 99 mg/dL Final   Glucose reference range applies only to samples taken  after fasting for at least 8 hours.   Glucose-Capillary 01/21/2022 282 (H)  70 - 99 mg/dL Final   Glucose reference range applies only to samples taken after fasting for at least 8 hours.  Admission on 01/18/2022, Discharged on  01/19/2022  Component Date Value Ref Range Status   WBC 01/18/2022 5.3  4.0 - 10.5 K/uL Final   RBC 01/18/2022 4.47  4.22 - 5.81 MIL/uL Final   Hemoglobin 01/18/2022 13.7  13.0 - 17.0 g/dL Final   HCT 01/18/2022 41.0  39.0 - 52.0 % Final   MCV 01/18/2022 91.7  80.0 - 100.0 fL Final   MCH 01/18/2022 30.6  26.0 - 34.0 pg Final   MCHC 01/18/2022 33.4  30.0 - 36.0 g/dL Final   RDW 01/18/2022 14.2  11.5 - 15.5 % Final   Platelets 01/18/2022 445 (H)  150 - 400 K/uL Final   nRBC 01/18/2022 0.0  0.0 - 0.2 % Final   Performed at [facility], [place] [address], [place], [place] [address]   Sodium 01/18/2022 136  135 - 145 mmol/L Final   Potassium 01/18/2022 4.3  3.5 - 5.1 mmol/L Final   Chloride 01/18/2022 100  98 - 111 mmol/L Final   CO2 01/18/2022 22  22 - 32 mmol/L Final   Glucose, Bld 01/18/2022 499 (H)  70 - 99 mg/dL Final   Glucose reference range applies only to samples taken after fasting for at least 8 hours.   BUN 01/18/2022 9  8 - 23 mg/dL Final   Creatinine, Ser 01/18/2022 1.23  0.61 - 1.24 mg/dL Final   Calcium 01/18/2022 9.1  8.9 - 10.3 mg/dL Final   Total Protein 01/18/2022 6.6  6.5 - 8.1 g/dL Final   Albumin 01/18/2022 3.2 (L)  3.5 - 5.0 g/dL Final   AST 01/18/2022 18  15 - 41 U/L Final   ALT 01/18/2022 9  0 - 44 U/L Final   Alkaline Phosphatase 01/18/2022 95  38 - 126 U/L Final   Total Bilirubin 01/18/2022 0.3  0.3 - 1.2 mg/dL Final   GFR, Estimated 01/18/2022 >60  >60 mL/min Final   Comment: (NOTE) Calculated using the CKD-EPI Creatinine Equation (2021)    Anion gap 01/18/2022 14  5 - 15 Final   Performed at [facility] [address] [place], [place] [address]   Color, Urine 01/18/2022 YELLOW  YELLOW Final   APPearance 01/18/2022 CLEAR  CLEAR Final   Specific Gravity, Urine 01/18/2022 1.025  1.005 - 1.030 Final   pH 01/18/2022 6.0  5.0 - 8.0 Final   Glucose, UA 01/18/2022 >=500 (A)  NEGATIVE mg/dL Final   Hgb urine dipstick 01/18/2022 NEGATIVE  NEGATIVE  Final   Bilirubin Urine 01/18/2022 NEGATIVE  NEGATIVE Final   Ketones, ur 01/18/2022 NEGATIVE  NEGATIVE mg/dL Final   Protein, ur 01/18/2022 NEGATIVE  NEGATIVE mg/dL Final   Nitrite 01/18/2022 NEGATIVE  NEGATIVE Final   Leukocytes,Ua 01/18/2022 SMALL (A)  NEGATIVE Final   RBC / HPF 01/18/2022 0-5  0 - 5 RBC/hpf Final   WBC, UA 01/18/2022 21-50  0 - 5 WBC/hpf Final   Bacteria, UA 01/18/2022 RARE (A)  NONE SEEN Final   Squamous Epithelial / LPF 01/18/2022 0-5  0 - 5 Final   Budding Yeast 01/18/2022 PRESENT   Final   Performed at [facility], [place] [address] [place], [place] [address]   Opiates 01/18/2022 NONE DETECTED  NONE DETECTED Final   Cocaine 01/18/2022 POSITIVE (A)  NONE DETECTED Final   Benzodiazepines 01/18/2022 NONE DETECTED  NONE DETECTED Final   Amphetamines 01/18/2022 NONE DETECTED  NONE DETECTED Final   Tetrahydrocannabinol 01/18/2022 NONE DETECTED  NONE DETECTED Final   Barbiturates 01/18/2022 NONE DETECTED  NONE DETECTED Final   Comment: (NOTE) DRUG SCREEN FOR MEDICAL PURPOSES ONLY.  IF CONFIRMATION IS NEEDED FOR ANY PURPOSE, NOTIFY LAB WITHIN 5 DAYS.  LOWEST DETECTABLE LIMITS FOR URINE DRUG SCREEN Drug Class                     Cutoff (ng/mL) Amphetamine and metabolites    1000 Barbiturate and metabolites    200 Benzodiazepine                 200 Opiates and metabolites        300 Cocaine and metabolites        300 THC                            50 Performed at [facility], [place] [address], [place], [place] [address]    Glucose-Capillary 01/18/2022 483 (H)  70 - 99 mg/dL Final   Glucose reference range applies only to samples taken after fasting for at least 8 hours.   Glucose-Capillary 01/19/2022 586 (HH)  70 - 99 mg/dL Final   Glucose reference range applies only to samples taken after fasting for at least 8 hours.   Glucose-Capillary 01/19/2022 456 (H)  70 - 99 mg/dL Final   Glucose reference range applies only to samples taken after fasting for  at least 8 hours.   Glucose-Capillary 01/19/2022 515 (HH)  70 - 99 mg/dL Final   Glucose reference range applies only to samples taken after fasting for at least 8 hours.   Comment 1 01/19/2022 Notify RN   Final   Glucose-Capillary 01/19/2022 164 (H)  70 - 99 mg/dL Final   Glucose reference range applies only to samples taken after fasting for at least 8 hours.  Admission on 01/07/2022, Discharged on 01/07/2022  Component Date Value Ref Range Status   Glucose-Capillary 01/07/2022 267 (H)  70 - 99 mg/dL Final   Glucose reference range applies only to samples taken after fasting for at least 8 hours.  Admission on 01/02/2022, Discharged on 01/02/2022  Component Date Value Ref Range Status   WBC 01/02/2022 7.0  4.0 - 10.5 K/uL Final   RBC 01/02/2022 4.54  4.22 - 5.81 MIL/uL Final   Hemoglobin 01/02/2022 13.9  13.0 - 17.0 g/dL Final   HCT 01/02/2022 41.4  39.0 - 52.0 % Final   MCV 01/02/2022 91.2  80.0 - 100.0 fL Final   MCH 01/02/2022 30.6  26.0 - 34.0 pg Final   MCHC 01/02/2022 33.6  30.0 - 36.0 g/dL Final   RDW 01/02/2022 14.1  11.5 - 15.5 % Final   Platelets 01/02/2022 369  150 - 400 K/uL Final   nRBC 01/02/2022 0.0  0.0 - 0.2 % Final   Neutrophils Relative % 01/02/2022 59  % Final   Neutro Abs 01/02/2022 4.1  1.7 - 7.7 K/uL Final   Lymphocytes Relative 01/02/2022 28  % Final   Lymphs Abs 01/02/2022 1.9  0.7 - 4.0 K/uL Final   Monocytes Relative 01/02/2022 8  % Final   Monocytes Absolute 01/02/2022 0.5  0.1 - 1.0 K/uL Final   Eosinophils Relative 01/02/2022 4  % Final   Eosinophils Absolute 01/02/2022 0.3  0.0 -  0.5 K/uL Final   Basophils Relative 01/02/2022 1  % Final   Basophils Absolute 01/02/2022 0.1  0.0 - 0.1 K/uL Final   Immature Granulocytes 01/02/2022 0  % Final   Abs Immature Granulocytes 01/02/2022 0.02  0.00 - 0.07 K/uL Final   Performed at [facility], [place] [address] [place], [place] [address]   Sodium 01/02/2022 138  135 - 145 mmol/L Final   Potassium  01/02/2022 4.1  3.5 - 5.1 mmol/L Final   Chloride 01/02/2022 104  98 - 111 mmol/L Final   CO2 01/02/2022 25  22 - 32 mmol/L Final   Glucose, Bld 01/02/2022 216 (H)  70 - 99 mg/dL Final   Glucose reference range applies only to samples taken after fasting for at least 8 hours.   BUN 01/02/2022 11  8 - 23 mg/dL Final   Creatinine, Ser 01/02/2022 0.98  0.61 - 1.24 mg/dL Final   Calcium 01/02/2022 9.5  8.9 - 10.3 mg/dL Final   Total Protein 01/02/2022 7.4  6.5 - 8.1 g/dL Final   Albumin 01/02/2022 3.4 (L)  3.5 - 5.0 g/dL Final   AST 01/02/2022 12 (L)  15 - 41 U/L Final   ALT 01/02/2022 11  0 - 44 U/L Final   Alkaline Phosphatase 01/02/2022 90  38 - 126 U/L Final   Total Bilirubin 01/02/2022 0.9  0.3 - 1.2 mg/dL Final   GFR, Estimated 01/02/2022 >60  >60 mL/min Final   Comment: (NOTE) Calculated using the CKD-EPI Creatinine Equation (2021)    Anion gap 01/02/2022 9  5 - 15 Final   Performed at [facility] [address], [place], [place] [address]  [facility] on 11/01/2021  Component Date Value Ref Range Status   POC Glucose 11/27/2021 301 (A)  70 - 99 mg/dl Final   1 hr after lunch, had not taken insulin  Admission on 10/13/2021, Discharged on 10/15/2021  Component Date Value Ref Range Status   Glucose-Capillary 10/13/2021 388 (H)  70 - 99 mg/dL Final   Glucose reference range applies only to samples taken after fasting for at least 8 hours.   SARS Coronavirus 2 by RT PCR 10/13/2021 NEGATIVE  NEGATIVE Final   Comment: (NOTE) SARS-CoV-2 target nucleic acids are NOT DETECTED.  The SARS-CoV-2 RNA is generally detectable in upper respiratory specimens during the acute phase of infection. The lowest concentration of SARS-CoV-2 viral copies this assay can detect is 138 copies/mL. A negative result does not preclude SARS-Cov-2 infection and should not be used as the sole basis for treatment or other patient management decisions. A negative result may occur with   improper specimen collection/handling, submission of specimen other than nasopharyngeal swab, presence of viral mutation(s) within the areas targeted by this assay, and inadequate number of viral copies(<138 copies/mL). A negative result must be combined with clinical observations, patient history, and epidemiological information. The expected result is Negative.  Fact Sheet for Patients:  EntrepreneurPulse.com.au  Fact Sheet for Healthcare Providers:  IncredibleEmployment.be  This test is no                          t yet approved or cleared by the [place] FDA and  has been authorized for detection and/or diagnosis of SARS-CoV-2 by FDA under an Emergency Use Authorization (EUA). This EUA will remain  in effect (meaning this test can be used) for the duration of the COVID-19 declaration under Section 564(b)(1) of  the Act, 21 U.S.C.section 360bbb-3(b)(1), unless the authorization is terminated  or revoked sooner.       Influenza A by PCR 10/13/2021 NEGATIVE  NEGATIVE Final   Influenza B by PCR 10/13/2021 NEGATIVE  NEGATIVE Final   Comment: (NOTE) The Xpert Xpress SARS-CoV-2/FLU/RSV plus assay is intended as an aid in the diagnosis of influenza from Nasopharyngeal swab specimens and should not be used as a sole basis for treatment. Nasal washings and aspirates are unacceptable for Xpert Xpress SARS-CoV-2/FLU/RSV testing.  Fact Sheet for Patients: EntrepreneurPulse.com.au  Fact Sheet for Healthcare Providers: IncredibleEmployment.be  This test is not yet approved or cleared by the [place] FDA and has been authorized for detection and/or diagnosis of SARS-CoV-2 by FDA under an Emergency Use Authorization (EUA). This EUA will remain in effect (meaning this test can be used) for the duration of the COVID-19 declaration under Section 564(b)(1) of the Act, 21 U.S.C. section 360bbb-3(b)(1), unless  the authorization is terminated or revoked.  Performed at [facility], [place] [address], [place], [place] [address]    WBC 10/13/2021 9.5  4.0 - 10.5 K/uL Final   RBC 10/13/2021 4.23  4.22 - 5.81 MIL/uL Final   Hemoglobin 10/13/2021 13.0  13.0 - 17.0 g/dL Final   HCT 10/13/2021 40.4  39.0 - 52.0 % Final   MCV 10/13/2021 95.5  80.0 - 100.0 fL Final   MCH 10/13/2021 30.7  26.0 - 34.0 pg Final   MCHC 10/13/2021 32.2  30.0 - 36.0 g/dL Final   RDW 10/13/2021 13.4  11.5 - 15.5 % Final   Platelets 10/13/2021 551 (H)  150 - 400 K/uL Final   nRBC 10/13/2021 0.0  0.0 - 0.2 % Final   Neutrophils Relative % 10/13/2021 68  % Final   Neutro Abs 10/13/2021 6.5  1.7 - 7.7 K/uL Final   Lymphocytes Relative 10/13/2021 18  % Final   Lymphs Abs 10/13/2021 1.7  0.7 - 4.0 K/uL Final   Monocytes Relative 10/13/2021 9  % Final   Monocytes Absolute 10/13/2021 0.8  0.1 - 1.0 K/uL Final   Eosinophils Relative 10/13/2021 3  % Final   Eosinophils Absolute 10/13/2021 0.3  0.0 - 0.5 K/uL Final   Basophils Relative 10/13/2021 1  % Final   Basophils Absolute 10/13/2021 0.1  0.0 - 0.1 K/uL Final   Immature Granulocytes 10/13/2021 1  % Final   Abs Immature Granulocytes 10/13/2021 0.11 (H)  0.00 - 0.07 K/uL Final   Performed at [facility], [place] [address], [place], [place] [address]   Sodium 10/13/2021 136  135 - 145 mmol/L Final   Potassium 10/13/2021 4.3  3.5 - 5.1 mmol/L Final   Chloride 10/13/2021 104  98 - 111 mmol/L Final   CO2 10/13/2021 24  22 - 32 mmol/L Final   Glucose, Bld 10/13/2021 296 (H)  70 - 99 mg/dL Final   Glucose reference range applies only to samples taken after fasting for at least 8 hours.   BUN 10/13/2021 14  8 - 23 mg/dL Final   Creatinine, Ser 10/13/2021 1.15  0.61 - 1.24 mg/dL Final   Calcium 10/13/2021 9.0  8.9 - 10.3 mg/dL Final   Total Protein 10/13/2021 7.5  6.5 - 8.1 g/dL Final   Albumin 10/13/2021 3.0 (L)  3.5 - 5.0 g/dL Final   AST 10/13/2021 13 (L)  15 - 41 U/L  Final   ALT 10/13/2021 10  0 - 44 U/L Final   Alkaline Phosphatase 10/13/2021 84  38 - 126 U/L Final   Total Bilirubin 10/13/2021 0.5  0.3 - 1.2 mg/dL Final   GFR, Estimated 10/13/2021 >60  >60 mL/min Final   Comment: (NOTE) Calculated using the CKD-EPI Creatinine Equation (2021)    Anion gap 10/13/2021 8  5 - 15 Final   Performed at [facility] [address] [place], [place] [address]   Color, Urine 10/13/2021 YELLOW  YELLOW Final   APPearance 10/13/2021 CLOUDY (A)  CLEAR Final   Specific Gravity, Urine 10/13/2021 1.022  1.005 - 1.030 Final   pH 10/13/2021 5.0  5.0 - 8.0 Final   Glucose, UA 10/13/2021 >=500 (A)  NEGATIVE mg/dL Final   Hgb urine dipstick 10/13/2021 LARGE (A)  NEGATIVE Final   Bilirubin Urine 10/13/2021 NEGATIVE  NEGATIVE Final   Ketones, ur 10/13/2021 NEGATIVE  NEGATIVE mg/dL Final   Protein, ur 10/13/2021 30 (A)  NEGATIVE mg/dL Final   Nitrite 10/13/2021 NEGATIVE  NEGATIVE Final   Leukocytes,Ua 10/13/2021 LARGE (A)  NEGATIVE Final   RBC / HPF 10/13/2021 >50 (H)  0 - 5 RBC/hpf Final   WBC, UA 10/13/2021 >50 (H)  0 - 5 WBC/hpf Final   Bacteria, UA 10/13/2021 RARE (A)  NONE SEEN Final   Squamous Epithelial / LPF 10/13/2021 0-5  0 - 5 Final   WBC Clumps 10/13/2021 PRESENT   Final   Mucus 10/13/2021 PRESENT   Final   Budding Yeast 10/13/2021 PRESENT   Final   Non Squamous Epithelial 10/13/2021 0-5 (A)  NONE SEEN Final   Performed at [facility], [place] [address], [place], [place] [address]   pH, Ven 10/13/2021 7.457 (H)  7.25 - 7.43 Final   pCO2, Ven 10/13/2021 38.1 (L)  44 - 60 mmHg Final   pO2, Ven 10/13/2021 174 (H)  32 - 45 mmHg Final   Bicarbonate 10/13/2021 26.9  20.0 - 28.0 mmol/L Final   TCO2 10/13/2021 28  22 - 32 mmol/L Final   O2 Saturation 10/13/2021 100  % Final   Acid-Base Excess 10/13/2021 3.0 (H)  0.0 - 2.0 mmol/L Final   Sodium 10/13/2021 137  135 - 145 mmol/L Final   Potassium 10/13/2021 4.3  3.5 - 5.1 mmol/L Final   Calcium, Ion  10/13/2021 1.10 (L)  1.15 - 1.40 mmol/L Final   HCT 10/13/2021 40.0  39.0 - 52.0 % Final   Hemoglobin 10/13/2021 13.6  13.0 - 17.0 g/dL Final   Sample type 10/13/2021 VENOUS   Final   Opiates 10/13/2021 NONE DETECTED  NONE DETECTED Final   Cocaine 10/13/2021 POSITIVE (A)  NONE DETECTED Final   Benzodiazepines 10/13/2021 NONE DETECTED  NONE DETECTED Final   Amphetamines 10/13/2021 NONE DETECTED  NONE DETECTED Final   Tetrahydrocannabinol 10/13/2021 NONE DETECTED  NONE DETECTED Final   Barbiturates 10/13/2021 NONE DETECTED  NONE DETECTED Final   Comment: (NOTE) DRUG SCREEN FOR MEDICAL PURPOSES ONLY.  IF CONFIRMATION IS NEEDED FOR ANY PURPOSE, NOTIFY LAB WITHIN 5 DAYS.  LOWEST DETECTABLE LIMITS FOR URINE DRUG SCREEN Drug Class                     Cutoff (ng/mL) Amphetamine and metabolites    1000 Barbiturate and metabolites    200 Benzodiazepine                 440 Tricyclics and metabolites     300 Opiates and metabolites        300 Cocaine and metabolites  300 THC                            50 Performed at [facility], [place] [address], [place], [place] [address]    Adenovirus 10/13/2021 NOT DETECTED  NOT DETECTED Final   Coronavirus 229E 10/13/2021 NOT DETECTED  NOT DETECTED Final   Comment: (NOTE) The Coronavirus on the Respiratory Panel, DOES NOT test for the novel  Coronavirus (2019 nCoV)    Coronavirus HKU1 10/13/2021 NOT DETECTED  NOT DETECTED Final   Coronavirus NL63 10/13/2021 NOT DETECTED  NOT DETECTED Final   Coronavirus OC43 10/13/2021 NOT DETECTED  NOT DETECTED Final   Metapneumovirus 10/13/2021 NOT DETECTED  NOT DETECTED Final   Rhinovirus / Enterovirus 10/13/2021 NOT DETECTED  NOT DETECTED Final   Influenza A 10/13/2021 NOT DETECTED  NOT DETECTED Final   Influenza B 10/13/2021 NOT DETECTED  NOT DETECTED Final   Parainfluenza Virus 1 10/13/2021 NOT DETECTED  NOT DETECTED Final   Parainfluenza Virus 2 10/13/2021 NOT DETECTED  NOT DETECTED Final    Parainfluenza Virus 3 10/13/2021 NOT DETECTED  NOT DETECTED Final   Parainfluenza Virus 4 10/13/2021 NOT DETECTED  NOT DETECTED Final   Respiratory Syncytial Virus 10/13/2021 NOT DETECTED  NOT DETECTED Final   Bordetella pertussis 10/13/2021 NOT DETECTED  NOT DETECTED Final   Bordetella Parapertussis 10/13/2021 NOT DETECTED  NOT DETECTED Final   Chlamydophila pneumoniae 10/13/2021 NOT DETECTED  NOT DETECTED Final   Mycoplasma pneumoniae 10/13/2021 NOT DETECTED  NOT DETECTED Final   Performed at [facility], [place] [address] [place], [place] [address]   Procalcitonin 10/13/2021 <0.10  ng/mL Final   Comment:        Interpretation: PCT (Procalcitonin) <= 0.5 ng/mL: Systemic infection (sepsis) is not likely. Local bacterial infection is possible. (NOTE)       Sepsis PCT Algorithm           Lower Respiratory Tract                                      Infection PCT Algorithm    ----------------------------     ----------------------------         PCT < 0.25 ng/mL                PCT < 0.10 ng/mL          Strongly encourage             Strongly discourage   discontinuation of antibiotics    initiation of antibiotics    ----------------------------     -----------------------------       PCT 0.25 - 0.50 ng/mL            PCT 0.10 - 0.25 ng/mL               OR       >80% decrease in PCT            Discourage initiation of                                            antibiotics      Encourage discontinuation           of antibiotics    ----------------------------     -----------------------------  PCT >= 0.50 ng/mL              PCT 0.26 - 0.50 ng/mL               AND                                 <80% decrease in PCT             Encourage initiation of                                             antibiotics       Encourage continuation           of antibiotics    ----------------------------     -----------------------------        PCT >= 0.50 ng/mL                  PCT >  0.50 ng/mL               AND         increase in PCT                  Strongly encourage                                      initiation of antibiotics    Strongly encourage escalation           of antibiotics                                     -----------------------------                                           PCT <= 0.25 ng/mL                                                 OR                                        > 80% decrease in PCT                                      Discontinue / Do not initiate                                             antibiotics  Performed at [facility], [address] [place], [place] [address]    B Natriuretic Peptide 10/13/2021 23.6  0.0 - 100.0 pg/mL Final   Performed at [facility]  [address] [place], [place] [address]   L. pneumophila Serogp 1 Ur Ag 10/13/2021 Negative  Negative Final   Comment: (NOTE) Presumptive negative for L. pneumophila serogroup 1 antigen in urine, suggesting no recent or current infection. Legionnaires' disease cannot be ruled out since other serogroups and species may also cause disease. Performed At: [facility] [place], [place] [id number] Rush Farmer MD [id number]    Source of Sample 10/13/2021 URINE, RANDOM   Final   Performed at [facility], [place] [address] [place], [place] [address]   Strep Pneumo Urinary Antigen 10/13/2021 NEGATIVE  NEGATIVE Final   Comment:        Infection due to S. pneumoniae cannot be absolutely ruled out since the antigen present may be below the detection limit of the test. Performed at [facility], [address], [place], [place] [address]    Sodium 10/14/2021 138  135 - 145 mmol/L Final   Potassium 10/14/2021 4.3  3.5 - 5.1 mmol/L Final   Chloride 10/14/2021 106  98 - 111 mmol/L Final   CO2 10/14/2021 25  22 - 32 mmol/L Final   Glucose, Bld 10/14/2021 286 (H)  70 - 99 mg/dL Final   Glucose reference range  applies only to samples taken after fasting for at least 8 hours.   BUN 10/14/2021 14  8 - 23 mg/dL Final   Creatinine, Ser 10/14/2021 0.98  0.61 - 1.24 mg/dL Final   Calcium 10/14/2021 8.9  8.9 - 10.3 mg/dL Final   GFR, Estimated 10/14/2021 >60  >60 mL/min Final   Comment: (NOTE) Calculated using the CKD-EPI Creatinine Equation (2021)    Anion gap 10/14/2021 7  5 - 15 Final   Performed at [facility], [place] [address], [place], [place] [address]   WBC 10/14/2021 9.6  4.0 - 10.5 K/uL Final   RBC 10/14/2021 4.12 (L)  4.22 - 5.81 MIL/uL Final   Hemoglobin 10/14/2021 12.6 (L)  13.0 - 17.0 g/dL Final   HCT 10/14/2021 38.8 (L)  39.0 - 52.0 % Final   MCV 10/14/2021 94.2  80.0 - 100.0 fL Final   MCH 10/14/2021 30.6  26.0 - 34.0 pg Final   MCHC 10/14/2021 32.5  30.0 - 36.0 g/dL Final   RDW 10/14/2021 13.4  11.5 - 15.5 % Final   Platelets 10/14/2021 556 (H)  150 - 400 K/uL Final   nRBC 10/14/2021 0.0  0.0 - 0.2 % Final   Performed at [facility], [place] [address], [place],  [address]   Glucose-Capillary 10/13/2021 281 (H)  70 - 99 mg/dL Final   Glucose reference range applies only to samples taken after fasting for at least 8 hours.   Glucose-Capillary 10/13/2021 399 (H)  70 - 99 mg/dL Final   Glucose reference range applies only to samples taken after fasting for at least 8 hours.   Glucose-Capillary 10/14/2021 239 (H)  70 - 99 mg/dL Final   Glucose reference range applies only to samples taken after fasting for at least 8 hours.   Glucose-Capillary 10/14/2021 345 (H)  70 - 99 mg/dL Final   Glucose reference range applies only to samples taken after fasting for at least 8 hours.   Glucose-Capillary 10/14/2021 383 (H)  70 - 99 mg/dL Final   Glucose reference range applies only to samples taken after fasting for at least 8 hours.   Glucose-Capillary 10/15/2021 194 (H)  70 - 99 mg/dL Final   Glucose reference range  applies only to samples taken after fasting for at least 8  hours.   Glucose-Capillary 10/15/2021 161 (H)  70 - 99 mg/dL Final   Glucose reference range applies only to samples taken after fasting for at least 8 hours.   Glucose-Capillary 10/15/2021 312 (H)  70 - 99 mg/dL Final   Glucose reference range applies only to samples taken after fasting for at least 8 hours.   Glucose-Capillary 10/14/2021 279 (H)  70 - 99 mg/dL Final   Glucose reference range applies only to samples taken after fasting for at least 8 hours.  Office Visit on 10/08/2021  Component Date Value Ref Range Status   POC Glucose 10/08/2021 321 (A)  70 - 99 mg/dl Final   HbA1c, POC (controlled diabetic ra* 10/08/2021 9.3 (A)  0.0 - 7.0 % Final  Admission on 10/07/2021, Discharged on 10/07/2021  Component Date Value Ref Range Status   Color, Urine 10/07/2021 YELLOW  YELLOW Final   APPearance 10/07/2021 HAZY (A)  CLEAR Final   Specific Gravity, Urine 10/07/2021 1.010  1.005 - 1.030 Final   pH 10/07/2021 5.0  5.0 - 8.0 Final   Glucose, UA 10/07/2021 >=500 (A)  NEGATIVE mg/dL Final   Hgb urine dipstick 10/07/2021 MODERATE (A)  NEGATIVE Final   Bilirubin Urine 10/07/2021 NEGATIVE  NEGATIVE Final   Ketones, ur 10/07/2021 NEGATIVE  NEGATIVE mg/dL Final   Protein, ur 10/07/2021 30 (A)  NEGATIVE mg/dL Final   Nitrite 10/07/2021 NEGATIVE  NEGATIVE Final   Leukocytes,Ua 10/07/2021 LARGE (A)  NEGATIVE Final   RBC / HPF 10/07/2021 21-50  0 - 5 RBC/hpf Final   WBC, UA 10/07/2021 >50 (H)  0 - 5 WBC/hpf Final   Bacteria, UA 10/07/2021 NONE SEEN  NONE SEEN Final   WBC Clumps 10/07/2021 PRESENT   Final   Mucus 10/07/2021 PRESENT   Final   Budding Yeast 10/07/2021 PRESENT   Final   Performed at [facility], [place] [address] [place], [place] [address]   WBC 10/07/2021 8.9  4.0 - 10.5 K/uL Final   RBC 10/07/2021 4.35  4.22 - 5.81 MIL/uL Final   Hemoglobin 10/07/2021 13.7  13.0 - 17.0 g/dL Final   HCT 10/07/2021 41.9  39.0 - 52.0 % Final   MCV 10/07/2021 96.3  80.0 - 100.0 fL  Final   MCH 10/07/2021 31.5  26.0 - 34.0 pg Final   MCHC 10/07/2021 32.7  30.0 - 36.0 g/dL Final   RDW 10/07/2021 13.4  11.5 - 15.5 % Final   Platelets 10/07/2021 376  150 - 400 K/uL Final   nRBC 10/07/2021 0.0  0.0 - 0.2 % Final   Neutrophils Relative % 10/07/2021 59  % Final   Neutro Abs 10/07/2021 5.2  1.7 - 7.7 K/uL Final   Lymphocytes Relative 10/07/2021 17  % Final   Lymphs Abs 10/07/2021 1.5  0.7 - 4.0 K/uL Final   Monocytes Relative 10/07/2021 19  % Final   Monocytes Absolute 10/07/2021 1.7 (H)  0.1 - 1.0 K/uL Final   Eosinophils Relative 10/07/2021 5  % Final   Eosinophils Absolute 10/07/2021 0.5  0.0 - 0.5 K/uL Final   Basophils Relative 10/07/2021 0  % Final   Basophils Absolute 10/07/2021 0.0  0.0 - 0.1 K/uL Final   Immature Granulocytes 10/07/2021 0  % Final   Abs Immature Granulocytes 10/07/2021 0.04  0.00 - 0.07 K/uL Final   Performed at [facility], [place] [address], [place],  [address]   Sodium  10/07/2021 141  135 - 145 mmol/L Final   Potassium 10/07/2021 4.0  3.5 - 5.1 mmol/L Final   Chloride 10/07/2021 107  98 - 111 mmol/L Final   CO2 10/07/2021 24  22 - 32 mmol/L Final   Glucose, Bld 10/07/2021 81  70 - 99 mg/dL Final   Glucose reference range applies only to samples taken after fasting for at least 8 hours.   BUN 10/07/2021 14  8 - 23 mg/dL Final   Creatinine, Ser 10/07/2021 1.19  0.61 - 1.24 mg/dL Final   Calcium 10/07/2021 9.4  8.9 - 10.3 mg/dL Final   Total Protein 10/07/2021 7.9  6.5 - 8.1 g/dL Final   Albumin 10/07/2021 3.5  3.5 - 5.0 g/dL Final   AST 10/07/2021 12 (L)  15 - 41 U/L Final   ALT 10/07/2021 10  0 - 44 U/L Final   Alkaline Phosphatase 10/07/2021 73  38 - 126 U/L Final   Total Bilirubin 10/07/2021 0.7  0.3 - 1.2 mg/dL Final   GFR, Estimated 10/07/2021 >60  >60 mL/min Final   Comment: (NOTE) Calculated using the CKD-EPI Creatinine Equation (2021)    Anion gap 10/07/2021 10  5 - 15 Final   Performed at [facility], [place] [address], [place], [place] [address]   Specimen Description 10/07/2021    Final                   Value:URINE, CLEAN CATCH Performed at [facility] [place] [address], [place], [place] [address]    Special Requests 10/07/2021    Final                   Value:NONE Performed at [facility], [place] [address], [place], [place] [address]    Culture 10/07/2021  (A)   Final                   Value:40,000 COLONIES/mL YEAST 20,000 COLONIES/mL LACTOBACILLUS SPECIES Standardized susceptibility testing for this organism is not available. Performed at [facility], [place] [address], [place],  [address]    Report Status 10/07/2021 10/08/2021 FINAL   Final  Admission on 10/04/2021, Discharged on 10/04/2021  Component Date Value Ref Range Status   WBC 10/04/2021 9.3  4.0 - 10.5 K/uL Final   RBC 10/04/2021 4.26  4.22 - 5.81 MIL/uL Final   Hemoglobin 10/04/2021 13.4  13.0 - 17.0 g/dL Final   HCT 10/04/2021 41.5  39.0 - 52.0 % Final   MCV 10/04/2021 97.4  80.0 - 100.0 fL Final   MCH 10/04/2021 31.5  26.0 - 34.0 pg Final   MCHC 10/04/2021 32.3  30.0 - 36.0 g/dL Final   RDW 10/04/2021 13.6  11.5 - 15.5 % Final   Platelets 10/04/2021 367  150 - 400 K/uL Final   nRBC 10/04/2021 0.0  0.0 - 0.2 % Final   Performed at [facility], [place] [address] [place], [place] [address]   Sodium 10/04/2021 138  135 - 145 mmol/L Final   Potassium 10/04/2021 4.4  3.5 - 5.1 mmol/L Final   Chloride 10/04/2021 107  98 - 111 mmol/L Final   CO2 10/04/2021 22  22 - 32 mmol/L Final   Glucose, Bld 10/04/2021 236 (H)  70 - 99 mg/dL Final   Glucose reference range applies only to samples taken after fasting for at least 8 hours.   BUN 10/04/2021 13  8 - 23 mg/dL Final  Creatinine, Ser 10/04/2021 1.15  0.61 - 1.24 mg/dL Final   Calcium 10/04/2021 8.9  8.9 - 10.3 mg/dL Final   GFR, Estimated 10/04/2021 >60  >60 mL/min Final   Comment:  (NOTE) Calculated using the CKD-EPI Creatinine Equation (2021)    Anion gap 10/04/2021 9  5 - 15 Final   Performed at [facility], [place] [address], [place], [place] [address]  There may be more visits with results that are not included.    Blood Alcohol level:  Lab Results  Component Value Date   ETH <10 01/20/2022   ETH <10 31/49/7026    Metabolic Disorder Labs: Lab Results  Component Value Date   HGBA1C 10.4 (H) 01/21/2022   MPG 251.78 01/21/2022   MPG >398 02/24/2021   No results found for: "PROLACTIN" Lab Results  Component Value Date   CHOL 213 (H) 05/23/2021   TRIG 115 05/23/2021   HDL 71 05/23/2021   CHOLHDL 3.2 11/03/2019   VLDL 32 (H) 05/03/2015   LDLCALC 122 (H) 05/23/2021   LDLCALC 99 11/03/2019    Therapeutic Lab Levels: No results found for: "LITHIUM" No results found for: "VALPROATE" No results found for: "CBMZ"  Physical Findings   GAD-7    Flowsheet Row Office Visit from 10/08/2021 in [facility] Office Visit from 09/05/2021 in [facility] Office Visit from 12/26/2020 in [place] Office Visit from 11/17/2018 in [facility] Office Visit from 06/05/2017 in [place]  Total GAD-7 Score _0 0 12      PHQ2-9    [facility] from 01/21/2022 in [facility] Office Visit from 10/08/2021 in [place] Office Visit from 09/05/2021 in [facility] ED from 07/02/2021 in [facility] Office Visit from 12/26/2020 in [facility]  PHQ-2 Total Score _1 PHQ-9 Total Score -- _2 Flowsheet Row ED from 01/21/2022 in [facility] ED from 01/20/2022 in [place] [facility] ED from 01/18/2022 in [place] Risk No Risk        Musculoskeletal  Strength & Muscle Tone: within normal limits Gait & Station:  walks in some pain, but appears steady Patient leans: Front  Psychiatric Specialty Exam  Presentation  General Appearance:  Appropriate for Environment  Eye Contact: Good  Speech: Clear and Coherent  Speech Volume: Normal  Handedness: Right   Mood and Affect  Mood: Euthymic ("good")  Affect: Appropriate; Congruent   Thought Process  Thought Processes: Coherent  Descriptions of Associations:Intact  Orientation:Full (Time, Place and Person)  Thought Content:Logical  Diagnosis of Schizophrenia or Schizoaffective disorder in past: No    Hallucinations:Hallucinations: None  Ideas of Reference:None  Suicidal Thoughts:Suicidal Thoughts: No  Homicidal Thoughts:Homicidal Thoughts: No   Sensorium  Memory: Immediate Fair; Recent Good; Remote Fair  Judgment: Good  Insight: Fair (Significant improvement)   Executive Functions  Concentration: Fair  Attention Span: Fair  Recall: [employer] of Knowledge: Fair  Language: Fair   Psychomotor Activity  Psychomotor Activity:Psychomotor Activity: Decreased   Assets  Assets: Armed forces logistics/support/administrative officer; Desire for Improvement   Sleep  Sleep:Sleep: Good   No data recorded  Physical Exam  Physical Exam Constitutional:      Appearance: Normal appearance.  HENT:     Head: Normocephalic and atraumatic.  Pulmonary:     Effort: Pulmonary effort is normal.  Neurological:     Mental Status: He is alert and oriented to person, place, and time.    Review of Systems  Psychiatric/Behavioral:  Negative for hallucinations and suicidal ideas. The patient is not nervous/anxious and does not have insomnia.    Blood pressure (!) 138/94, pulse 90, temperature 97.8 F (36.6 C), temperature source Oral, resp. rate 18, SpO2 100 %. There is no  height or weight on file to calculate BMI.  Treatment Plan Summary:  Yurem Viner is a 61 y.o. male, with PMH cocaine use disorder, tobacco use disorder, housing instability, DM2 + neuropathy, HTN, HLD, no inpatient psych admission, no suicide attempts, who initially presented to [facility] (01/20/2022) for passive SI and assistance with rehab for crack cocaine use. Patient was then IVC by EDP, which was rescinded on 01/21/2022 by Mayra Neer, MD.  He was then admitted [facility] to [facility] (01/21/2022) via EMS for assistance with residential placement.     On assessment today, patient insight appears to be significantly improved and his judgement continues to improve. Patient has been compliant with medications and appears to be practicing positive coping skills. Patient appetite continues to be large, but patient is redirecting himself more and spending less time asking for food. Patient appears to be trying to plan ahead for his discharge and has already reached out to his PCP about follow-up for his medical care. Overall patient mood is stable, patient is not having cravings for Etoh but he could benefit from increase in trazodone to address prolonged time to falling asleep.    Cocaine use d/o  Tobacco use d/o  Action stage. Encouraged cessation Comfort PRNs    Substance induced mood d/o Continued home Cymbalta 60 mg daily - Increase Trazodone to 140m QHS PRN   Chronic Pain - Continued gabapentin 6048mTID - Continued PRN Tylenol   DM2-insulin-dependent with neuropathy, IMPROVING Urge Incontinence  BPH A1c 10.4 (01/21/2022), from 9.7 earlier this year. Goal CBGs <180. CBGs mostly <180 with no hypoglycemic events.  After educating on uncontrolled DM2, patient continues to work diligently on diet, self administering insulin and foot exam. In regards to urge incontinence patient has been able to make it to bathroom every time during this stay. Urge incontinence has been improving with better  glycemic control.  Continued home metformin 1000 mg twice daily Continued home glipizide 10 mg with breakfast Continued home empagliflozin 10 mg with breakfast Continued home long acting insulin, self administered - 24 units daily  Continued sensitive sliding scale  Continued short acting 3 units BID for hypoglycemia per above (home dose was 5 units BID) Continued Gabapentin 600 mg TID and Cymbalta per above for neuropathy Educated on self foot exam twice daily Increased home tamsulosin 0.15m66maily to 0.8mg315mily Pt request info getting a glucometer   Increased appetite Hx of GERD Improving, still hungry Continue pepcid 10mg9m  Considerations for follow-up: Recommend high risk screening every 6-12 months with HIV, RPR, hepatitis panel PCP for insulin dependent DM2 - uncontrolled [facility]   DISPO: Tentative date: 11/27 [place]  [facility] required for insulin dependency    PGY-3 Myosha Cuadras BFreida Busman11/23/2023 11:33 AM

## 2022-01-31 NOTE — ED Notes (Signed)
Patient sitting on couch in hallway. Patient voices no complaints or concerns. Respirations equal and unlabored, skin warm and dry, NAD. No change in assessment or acuity. Q 15 minute safety checks remain in place.

## 2022-01-31 NOTE — ED Notes (Signed)
Patient A&Ox4. Patient denies SI/Hi and AVH.  Patient denies any physical complaints when asked. No acute distress noted. Support and encouragement provided. Routine safety checks conducted according to facility protocol. Encouraged patient to notify staff if thoughts of harm toward self or others arise. Patient verbalize understanding and agreement. Will continue to monitor for safety.    

## 2022-01-31 NOTE — ED Notes (Addendum)
Pt is in the hallway. Respirations are even and unlabored. No acute distress noted. Will continue to monitor for safety.

## 2022-01-31 NOTE — ED Notes (Signed)
Pt is in the bed sleeping. Respirations are even and unlabored. No acute distress noted. Will continue to monitor for safety. 

## 2022-02-01 ENCOUNTER — Other Ambulatory Visit (HOSPITAL_COMMUNITY): Payer: Self-pay

## 2022-02-01 ENCOUNTER — Ambulatory Visit: Payer: Self-pay

## 2022-02-01 DIAGNOSIS — E785 Hyperlipidemia, unspecified: Secondary | ICD-10-CM | POA: Diagnosis not present

## 2022-02-01 DIAGNOSIS — Z720 Tobacco use: Secondary | ICD-10-CM | POA: Diagnosis not present

## 2022-02-01 DIAGNOSIS — F1410 Cocaine abuse, uncomplicated: Secondary | ICD-10-CM | POA: Diagnosis not present

## 2022-02-01 DIAGNOSIS — I10 Essential (primary) hypertension: Secondary | ICD-10-CM | POA: Diagnosis not present

## 2022-02-01 DIAGNOSIS — F1420 Cocaine dependence, uncomplicated: Secondary | ICD-10-CM | POA: Diagnosis not present

## 2022-02-01 LAB — GLUCOSE, CAPILLARY
Glucose-Capillary: 124 mg/dL — ABNORMAL HIGH (ref 70–99)
Glucose-Capillary: 138 mg/dL — ABNORMAL HIGH (ref 70–99)
Glucose-Capillary: 160 mg/dL — ABNORMAL HIGH (ref 70–99)
Glucose-Capillary: 171 mg/dL — ABNORMAL HIGH (ref 70–99)
Glucose-Capillary: 184 mg/dL — ABNORMAL HIGH (ref 70–99)

## 2022-02-01 MED ORDER — ACCU-CHEK GUIDE VI STRP
ORAL_STRIP | 0 refills | Status: DC
  Filled 2022-02-01: qty 100, 30d supply, fill #0

## 2022-02-01 MED ORDER — ACCU-CHEK GUIDE W/DEVICE KIT: 3.0000 | PACK | Freq: Three times a day (TID) | Status: DC

## 2022-02-01 MED ORDER — GLUCOSE BLOOD VI STRP: 1.0000 | ORAL_STRIP | Freq: Three times a day (TID) | Status: DC

## 2022-02-01 MED ORDER — ACCU-CHEK SOFTCLIX LANCETS MISC: 3.0000 | Freq: Three times a day (TID) | Status: DC

## 2022-02-01 MED ORDER — ACCU-CHEK FASTCLIX LANCETS MISC
0 refills | Status: DC
  Filled 2022-02-01: qty 100, 30d supply, fill #0

## 2022-02-01 MED ORDER — ACCU-CHEK GUIDE W/DEVICE KIT
PACK | 0 refills | Status: DC
  Filled 2022-02-01: qty 1, 30d supply, fill #0

## 2022-02-01 NOTE — Inpatient Diabetes Management (Signed)
Paged by Dr. Candie Chroman regarding need for [facility] for this pt.  MD told me they would be filling pt's meds prior to d/c through the [facility] but that the [facility] they use does not carry CBG meters.  I called [facility] pharmacy at [facility] and they do have CBG meters and can fill this request if the MD sends Rx to them.  Called Dr. Candie Chroman back and asked her to send the Rx for CBG meter and supplies through the [facility] at [place] and they can courier the meter to the urgent care [facility] center.      Wyn Quaker RN, MSN, [place] Diabetes Coordinator Inpatient Glycemic Control Team Team Pager: [phone] (8a-5p)

## 2022-02-01 NOTE — ED Notes (Signed)
Patient remains angry, irritable, labile and intrusive.  He is often demanding and entitled.  Patient has poor insight into illness.  He is non compliant with diabetic diet despite staff attempting to educate and limit intake to appropriate items.  He is resistant to limits and requires firm redirection.  Will monitor.

## 2022-02-01 NOTE — ED Notes (Signed)
Patient observed/assessed at nursing station. Patient alert and oriented x 4. Affect is bright. Patient denies pain and anxiety. He denies A/V/H. He denies having any thoughts/plan of self harm and harm towards others. Fluid and snack offered. Patient states that appetite has been good throughout the day. Last BM was 02/01/22. Verbalizes no further complaints at this time. Will continue to monitor and support.

## 2022-02-01 NOTE — Telephone Encounter (Signed)
Summary: diabetes   Pt called saying he is living in a rehab center right now.  He has diabetes.  He said he doesn't feel like they are giving him enough insulin or food.  He says he feels like he is hungry all the time.  CB@ [phone]      Called pt - [email or web address]

## 2022-02-01 NOTE — ED Notes (Signed)
Pt is in the bed sleeping. Respirations are even and unlabored. No acute distress noted. Will continue to monitor for safety. 

## 2022-02-01 NOTE — Progress Notes (Signed)
Patient ate oatmeal, a chocolate muffin, yohurt and a low sugar gatorade for breakfast this morning.  For snack he was given a yoghurt, cheese it crackers, and a glucerna.

## 2022-02-01 NOTE — Telephone Encounter (Signed)
  Chief Complaint: Hungry - Unsure if getting proper care for diabetes. Symptoms:  Frequency:  Pertinent Negatives: Patient denies  Disposition: '[]'$ ED /'[]'$ Urgent Care (no appt availability in office) / '[]'$ Appointment(In office/virtual)/ '[]'$  [facility]/ '[]'$ Home Care/ '[]'$ Refused Recommended Disposition /'[]'$ [facility]/ '[x]'$  Follow-up with PCP Additional Notes: Pt is currently at [facility]. He is being transferred to rehab next week. Pt states that they are feeding him very little and he is hungry all the time. Pt is receiving blood glucose checks and insulin. Pt states that he is hungry all the time.      Summary: diabetes   Pt called saying he is living in a rehab center right now.  He has diabetes.  He said he doesn't feel like they are giving him enough insulin or food.  He says he feels like he is hungry all the time.  CB@ [phone]         Reason for Disposition  [1] Caller requesting NON-URGENT health information AND [2] PCP's office is the best resource  Answer Assessment - Initial Assessment Questions 1. REASON FOR CALL or QUESTION: "What is your reason for calling today?" or "How can I best help you?" or "What question do you have that I can help answer?"     Pt states that he is hungry all of the time. He is unsure if he is getting proper treatment for his diabetes.  Protocols used: Information Only Call - No Triage-A-AH

## 2022-02-01 NOTE — ED Provider Notes (Signed)
Behavioral Health Progress Note  Date and Time: 02/01/2022 1:37 PM Name: David Wyatt MRN:  [id number]  Subjective:   David Wyatt is a 62 y.o. male, with PMH cocaine use disorder, tobacco use disorder, housing instability, DM2 + neuropathy, HTN, HLD, no inpatient psych admission, no suicide attempts, who initially presented to [facility] (01/20/2022) for passive SI and assistance with rehab for crack cocaine use. Patient was then IVC by EDP, which was rescinded on 01/21/2022 by Mayra Neer, MD.  He was then admitted Voluntary to [facility] (01/21/2022) via EMS for assistance with residential placement.     On assessment today, patient reports that he is doing well overall. Patient reports he slept well and is making to the bathroom to urinate on time. Patient reports that he is still a bit confused about his diet and has cravings for sugar. Patient and provider discuss this and come up with a plan. Patient endorses that he will do his best to stick to the plan "it is best for me, I'm only harming myself." Patient endorses he is having no cravings and looking forward to rehab. Patient denies SI, HI, and AVH endorsing that he has been in a good mood overall.   Diagnosis:  Final diagnoses:  Dermoid cyst of skin of back  Tobacco abuse  Diabetic polyneuropathy associated with type 2 diabetes mellitus (HCC)  Cocaine use disorder, severe, dependence ([place])    Total Time spent with patient: 20 minutes  Past Psychiatric History: See H&P Past Medical History:  Past Medical History:  Diagnosis Date   Angioedema of lips 07/28/2012   left upper (07/29/2012)   Arthritis    hands and back   Chronic back pain    Cocaine abuse ([place])    Diabetic neuropathy (HCC)    High cholesterol    Hypertension    Neuropathy    Type II diabetes mellitus ([place])     Past Surgical History:  Procedure Laterality Date   BACK SURGERY     CYSTOSCOPY W/ URETERAL STENT PLACEMENT Right 02/23/2021   Procedure: CYSTOSCOPY WITH RETROGRADE  PYELOGRAM/URETERAL STENT PLACEMENT;  Surgeon: Janith Lima, MD;  Location: [facility];  Service: Urology;  Laterality: Right;   HERNIA REPAIR Right 04/01/2012   I & D EXTREMITY Left 12/13/2012   Procedure: IRRIGATION AND DEBRIDEMENT LEFT THUMB;  Surgeon: Tennis Must, MD;  Location: [place];  Service: Orthopedics;  Laterality: Left;   INCISION AND DRAINAGE OF WOUND     boil on back/notes 07/14/2008 (07/29/2012)   INGUINAL HERNIA REPAIR  04/01/2012   Procedure: HERNIA REPAIR INGUINAL ADULT;  Surgeon: Haywood Lasso, MD;  Location: [place];  Service: General;  Laterality: Right;   INGUINAL HERNIA REPAIR Left 09/06/2016   Procedure: OPEN REPAIR LEFT INGUINAL HERNIA;  Surgeon: Greer Pickerel, MD;  Location: [place];  Service: General;  Laterality: Left;   INSERTION OF MESH Left 09/06/2016   Procedure: INSERTION OF MESH;  Surgeon: Greer Pickerel, MD;  Location: [place];  Service: General;  Laterality: Left;   TONSILLECTOMY     Family History:  Family History  Problem Relation Age of Onset   Diabetes Mother    Kidney disease Mother    Hyperlipidemia Mother    Hyperlipidemia Father    Diabetes Father    Family Psychiatric  History:  See H&P Social History:  Social History   Substance and Sexual Activity  Alcohol Use No   Alcohol/week: 0.0 standard drinks of alcohol     Social History  Substance and Sexual Activity  Drug Use Not Currently   Types: "Crack" cocaine    Social History   Socioeconomic History   Marital status: Divorced    Spouse name: Not on file   Number of children: 3   Years of education: 12   Highest education level: Not on file  Occupational History    Comment: disabled  Tobacco Use   Smoking status: Every Day    Packs/day: 0.25    Years: 30.00    Total pack years: 7.50    Types: Cigarettes   Smokeless tobacco: Former    Quit date: 08/15/2015   Tobacco comments:    04/29/16 2  cigs daily, 10/27/17 sometimes < .25 PPD  Vaping Use   Vaping Use: Never used  Substance and  Sexual Activity   Alcohol use: No    Alcohol/week: 0.0 standard drinks of alcohol   Drug use: Not Currently    Types: "Crack" cocaine   Sexual activity: Not on file  Other Topics Concern   Not on file  Social History Narrative   04/29/17   Lives in shelter   Caffeine- a lot of  tea, coffee   Social Determinants of Health   Financial Resource Strain: Not on file  Food Insecurity: Not on file  Transportation Needs: Not on file  Physical Activity: Not on file  Stress: Not on file  Social Connections: Not on file   SDOH:  SDOH Screenings   Depression (PHQ2-9): Low Risk  (01/22/2022)  Tobacco Use: High Risk (01/23/2022)   Additional Social History:                         Sleep: Good  Appetite:  Good  Current Medications:  Current Facility-Administered Medications  Medication Dose Route Frequency Provider Last Rate Last Admin   acetaminophen (TYLENOL) tablet 650 mg  650 mg Oral Q6H PRN Merrily Brittle, DO   650 mg at 01/29/22 1841   alum & mag hydroxide-simeth (MAALOX/MYLANTA) 200-200-20 MG/5ML suspension 30 mL  30 mL Oral Q4H PRN Merrily Brittle, DO       amLODipine (NORVASC) tablet 10 mg  10 mg Oral Daily Merrily Brittle, DO   10 mg at 02/01/22 1015   atorvastatin (LIPITOR) tablet 40 mg  40 mg Oral Daily Merrily Brittle, DO   40 mg at 02/01/22 1014   DULoxetine (CYMBALTA) DR capsule 60 mg  60 mg Oral Daily Merrily Brittle, DO   60 mg at 02/01/22 1014   empagliflozin (JARDIANCE) tablet 10 mg  10 mg Oral QAC breakfast Merrily Brittle, DO   10 mg at 02/01/22 0748   famotidine (PEPCID) tablet 10 mg  10 mg Oral BID Cinderella, Margaret A   10 mg at 02/01/22 1015   feeding supplement (GLUCERNA SHAKE) ([place]) liquid 237 mL  237 mL Oral TID BM Merrily Brittle, DO   237 mL at 02/01/22 1014   gabapentin (NEURONTIN) capsule 600 mg  600 mg Oral TID Cinderella, Margaret A   600 mg at 02/01/22 0748   glipiZIDE (GLUCOTROL XL) 24 hr tablet 10 mg  10 mg Oral Q breakfast Merrily Brittle,  DO   10 mg at 02/01/22 0748   insulin aspart (novoLOG) injection 0-5 Units  0-5 Units Subcutaneous QHS Onu[place], Chinwendu V, NP   4 Units at 01/31/22 2027   insulin aspart (novoLOG) injection 0-6 Units  0-6 Units Subcutaneous TID WC Merrily Brittle, DO   1 Units at 02/01/22 [phone]  insulin aspart (novoLOG) injection 3 Units  3 Units Subcutaneous BID AC Merrily Brittle, DO   3 Units at 02/01/22 1212   insulin glargine-yfgn (SEMGLEE) injection 24 Units  24 Units Subcutaneous Daily Minda Ditto, RPH   24 Units at 02/01/22 1019   lubriderm seriously sensitive lotion 1 Application  1 Application Topical BID Cinderella, Margaret A   1 Application at 97/58/83 1019   magnesium hydroxide (MILK OF MAGNESIA) suspension 30 mL  30 mL Oral Daily PRN Merrily Brittle, DO       metFORMIN (GLUCOPHAGE) tablet 1,000 mg  1,000 mg Oral BID WC Merrily Brittle, DO   1,000 mg at 02/01/22 0747   nicotine (NICODERM CQ - dosed in mg/24 hours) patch 21 mg  21 mg Transdermal Q0600 Merrily Brittle, DO   21 mg at 02/01/22 0503   nicotine polacrilex (NICORETTE) gum 4 mg  4 mg Oral PRN Merrily Brittle, DO       tamsulosin [facility]) capsule 0.8 mg  0.8 mg Oral Daily Merrily Brittle, DO   0.8 mg at 02/01/22 1014   traZODone (DESYREL) tablet 100 mg  100 mg Oral QHS PRN Freida Busman, MD   100 mg at 01/31/22 2019   Current Outpatient Medications  Medication Sig Dispense Refill   Accu-Chek FastClix Lancets MISC Use as directed 100 each 0   Accu-Chek Softclix Lancets lancets Use up to 4 times daily as directed 100 each 0   amLODipine (NORVASC) 10 MG tablet Take 1 tablet (10 mg total) by mouth daily. 30 tablet 0   atorvastatin (LIPITOR) 40 MG tablet Take 1 tablet (40 mg total) by mouth daily. 30 tablet 0   Blood Glucose Monitoring Suppl (ACCU-CHEK GUIDE) w/Device KIT USE AS DIRECTED TO CHECK BLOOD SUGARS UP TO TWICE PER DAY 1 kit 0   Blood Glucose Monitoring Suppl (ACCU-CHEK GUIDE) w/Device KIT Use as directed 1 kit 0   DULoxetine (CYMBALTA) 60 MG  capsule Take 1 capsule (60 mg total) by mouth daily. 30 capsule 0   empagliflozin (JARDIANCE) 10 MG TABS tablet Take 1 tablet (10 mg total) by mouth daily before breakfast. 30 tablet 0   gabapentin (NEURONTIN) 300 MG capsule Take 2 capsules (600 mg total) by mouth 3 (three) times daily. 180 capsule 0   glipiZIDE (GLUCOTROL XL) 10 MG 24 hr tablet Take 1 tablet (10 mg total) by mouth daily with breakfast. 30 tablet 0   glucose blood (ACCU-CHEK GUIDE) test strip Use up to 3 times daily as directed 100 each 3   glucose blood (ACCU-CHEK GUIDE) test strip Use as directed 100 each 0   Insulin Glargine Solostar (LANTUS) 100 UNIT/ML Solostar Pen Inject 24 Units into the skin daily. 15 mL 2   insulin lispro (HUMALOG) 100 UNIT/ML KwikPen Inject 5 Units into the skin 2 (two) times daily before lunch and supper. (Patient not taking: Reported on 01/20/2022) 6 mL 6   Insulin Pen Needle 31G X 8 MM MISC Use with insulin pen 100 each 1   metFORMIN (GLUCOPHAGE) 1000 MG tablet Take 1 tablet (1,000 mg total) by mouth 2 (two) times daily. 60 tablet 0   tamsulosin (FLOMAX) 0.4 MG CAPS capsule Take 1 capsule (0.4 mg total) by mouth daily. For renal calculi 30 capsule 0    Labs  Lab Results:  Admission on 01/21/2022  Component Date Value Ref Range Status   Hgb A1c MFr Bld 01/21/2022 10.4 (H)  4.8 - 5.6 % Final   Comment: (NOTE) Pre diabetes:  5.7%-6.4%  Diabetes:              >6.4%  Glycemic control for   <7.0% adults with diabetes    Mean Plasma Glucose 01/21/2022 251.78  mg/dL Final   Performed at [facility] [address] [place], [place] [address]   TSH 01/21/2022 0.248 (L)  0.350 - 4.500 uIU/mL Final   Comment: Performed by a 3rd Generation assay with a functional sensitivity of <=0.01 uIU/mL. Performed at [facility], [place] [address] [place], [place] [address]    Glucose-Capillary 01/21/2022 374 (H)  70 - 99 mg/dL Final   Glucose reference range applies only to samples taken  after fasting for at least 8 hours.   Glucose-Capillary 01/21/2022 289 (H)  70 - 99 mg/dL Final   Glucose reference range applies only to samples taken after fasting for at least 8 hours.   Glucose-Capillary 01/22/2022 137 (H)  70 - 99 mg/dL Final   Glucose reference range applies only to samples taken after fasting for at least 8 hours.   Glucose-Capillary 01/22/2022 297 (H)  70 - 99 mg/dL Final   Glucose reference range applies only to samples taken after fasting for at least 8 hours.   T3, Free 01/23/2022 2.2  2.0 - 4.4 pg/mL Final   Comment: (NOTE) Performed At: [facility] [place], [place] [id number] Rush Farmer MD HU:[id number]    Glucose-Capillary 01/22/2022 94  70 - 99 mg/dL Final   Glucose reference range applies only to samples taken after fasting for at least 8 hours.   Free T4 01/23/2022 0.72  0.61 - 1.12 ng/dL Final   Comment: (NOTE) Biotin ingestion may interfere with free T4 tests. If the results are inconsistent with the TSH level, previous test results, or the clinical presentation, then consider biotin interference. If needed, order repeat testing after stopping biotin. Performed at [facility], [place] [address], [place], [place] [address]    TSH 01/23/2022 0.203 (L)  0.350 - 4.500 uIU/mL Final   Comment: Performed by a 3rd Generation assay with a functional sensitivity of <=0.01 uIU/mL. Performed at [facility], [place] [address], [place], [place] [address]    Glucose-Capillary 01/23/2022 132 (H)  70 - 99 mg/dL Final   Glucose reference range applies only to samples taken after fasting for at least 8 hours.   Glucose-Capillary 01/22/2022 59 (L)  70 - 99 mg/dL Final   Glucose reference range applies only to samples taken after fasting for at least 8 hours.   Glucose-Capillary 01/22/2022 177 (H)  70 - 99 mg/dL Final   Glucose reference range applies only to samples taken after fasting for at least 8 hours.   Glucose-Capillary  01/23/2022 284 (H)  70 - 99 mg/dL Final   Glucose reference range applies only to samples taken after fasting for at least 8 hours.   Glucose-Capillary 01/23/2022 188 (H)  70 - 99 mg/dL Final   Glucose reference range applies only to samples taken after fasting for at least 8 hours.   Glucose-Capillary 01/23/2022 294 (H)  70 - 99 mg/dL Final   Glucose reference range applies only to samples taken after fasting for at least 8 hours.   Glucose-Capillary 01/24/2022 180 (H)  70 - 99 mg/dL Final   Glucose reference range applies only to samples taken after fasting for at least 8 hours.   Glucose-Capillary 01/24/2022 275 (H)  70 - 99 mg/dL Final   Glucose reference  range applies only to samples taken after fasting for at least 8 hours.   Glucose-Capillary 01/24/2022 146 (H)  70 - 99 mg/dL Final   Glucose reference range applies only to samples taken after fasting for at least 8 hours.   Glucose-Capillary 01/24/2022 137 (H)  70 - 99 mg/dL Final   Glucose reference range applies only to samples taken after fasting for at least 8 hours.   Glucose-Capillary 01/25/2022 201 (H)  70 - 99 mg/dL Final   Glucose reference range applies only to samples taken after fasting for at least 8 hours.   Glucose-Capillary 01/25/2022 195 (H)  70 - 99 mg/dL Final   Glucose reference range applies only to samples taken after fasting for at least 8 hours.   Glucose-Capillary 01/25/2022 149 (H)  70 - 99 mg/dL Final   Glucose reference range applies only to samples taken after fasting for at least 8 hours.   Glucose-Capillary 01/25/2022 147 (H)  70 - 99 mg/dL Final   Glucose reference range applies only to samples taken after fasting for at least 8 hours.   Glucose-Capillary 01/26/2022 123 (H)  70 - 99 mg/dL Final   Glucose reference range applies only to samples taken after fasting for at least 8 hours.   Glucose-Capillary 01/26/2022 176 (H)  70 - 99 mg/dL Final   Glucose reference range applies only to samples taken  after fasting for at least 8 hours.   Glucose-Capillary 01/26/2022 84  70 - 99 mg/dL Final   Glucose reference range applies only to samples taken after fasting for at least 8 hours.   Glucose-Capillary 01/26/2022 177 (H)  70 - 99 mg/dL Final   Glucose reference range applies only to samples taken after fasting for at least 8 hours.   Glucose-Capillary 01/27/2022 150 (H)  70 - 99 mg/dL Final   Glucose reference range applies only to samples taken after fasting for at least 8 hours.   Glucose-Capillary 01/27/2022 171 (H)  70 - 99 mg/dL Final   Glucose reference range applies only to samples taken after fasting for at least 8 hours.   Glucose-Capillary 01/27/2022 159 (H)  70 - 99 mg/dL Final   Glucose reference range applies only to samples taken after fasting for at least 8 hours.   Glucose-Capillary 01/27/2022 67 (L)  70 - 99 mg/dL Final   Glucose reference range applies only to samples taken after fasting for at least 8 hours.   Glucose-Capillary 01/27/2022 107 (H)  70 - 99 mg/dL Final   Glucose reference range applies only to samples taken after fasting for at least 8 hours.   Glucose-Capillary 01/27/2022 144 (H)  70 - 99 mg/dL Final   Glucose reference range applies only to samples taken after fasting for at least 8 hours.   Glucose-Capillary 01/28/2022 175 (H)  70 - 99 mg/dL Final   Glucose reference range applies only to samples taken after fasting for at least 8 hours.   Glucose-Capillary 01/28/2022 198 (H)  70 - 99 mg/dL Final   Glucose reference range applies only to samples taken after fasting for at least 8 hours.   Glucose-Capillary 01/28/2022 186 (H)  70 - 99 mg/dL Final   Glucose reference range applies only to samples taken after fasting for at least 8 hours.   Glucose-Capillary 01/28/2022 198 (H)  70 - 99 mg/dL Final   Glucose reference range applies only to samples taken after fasting for at least 8 hours.   Glucose-Capillary 01/29/2022 111 (H)  70 - 99 mg/dL Final   Glucose  reference range applies only to samples taken after fasting for at least 8 hours.   Glucose-Capillary 01/29/2022 160 (H)  70 - 99 mg/dL Final   Glucose reference range applies only to samples taken after fasting for at least 8 hours.   Glucose-Capillary 01/29/2022 209 (H)  70 - 99 mg/dL Final   Glucose reference range applies only to samples taken after fasting for at least 8 hours.   Glucose-Capillary 01/29/2022 155 (H)  70 - 99 mg/dL Final   Glucose reference range applies only to samples taken after fasting for at least 8 hours.   Glucose-Capillary 01/30/2022 157 (H)  70 - 99 mg/dL Final   Glucose reference range applies only to samples taken after fasting for at least 8 hours.   Glucose-Capillary 01/30/2022 168 (H)  70 - 99 mg/dL Final   Glucose reference range applies only to samples taken after fasting for at least 8 hours.   Glucose-Capillary 01/30/2022 217 (H)  70 - 99 mg/dL Final   Glucose reference range applies only to samples taken after fasting for at least 8 hours.   Glucose-Capillary 01/30/2022 246 (H)  70 - 99 mg/dL Final   Glucose reference range applies only to samples taken after fasting for at least 8 hours.   Glucose-Capillary 01/30/2022 287 (H)  70 - 99 mg/dL Final   Glucose reference range applies only to samples taken after fasting for at least 8 hours.   Glucose-Capillary 01/31/2022 138 (H)  70 - 99 mg/dL Final   Glucose reference range applies only to samples taken after fasting for at least 8 hours.   Glucose-Capillary 01/31/2022 193 (H)  70 - 99 mg/dL Final   Glucose reference range applies only to samples taken after fasting for at least 8 hours.   Glucose-Capillary 01/31/2022 274 (H)  70 - 99 mg/dL Final   Glucose reference range applies only to samples taken after fasting for at least 8 hours.   Glucose-Capillary 01/31/2022 320 (H)  70 - 99 mg/dL Final   Glucose reference range applies only to samples taken after fasting for at least 8 hours.    Glucose-Capillary 01/31/2022 160 (H)  70 - 99 mg/dL Final   Glucose reference range applies only to samples taken after fasting for at least 8 hours.   Glucose-Capillary 02/01/2022 184 (H)  70 - 99 mg/dL Final   Glucose reference range applies only to samples taken after fasting for at least 8 hours.   Glucose-Capillary 02/01/2022 138 (H)  70 - 99 mg/dL Final   Glucose reference range applies only to samples taken after fasting for at least 8 hours.  Admission on 01/20/2022, Discharged on 01/21/2022  Component Date Value Ref Range Status   SARS Coronavirus 2 by RT PCR 01/20/2022 NEGATIVE  NEGATIVE Final   Comment: (NOTE) SARS-CoV-2 target nucleic acids are NOT DETECTED.  The SARS-CoV-2 RNA is generally detectable in upper respiratory specimens during the acute phase of infection. The lowest concentration of SARS-CoV-2 viral copies this assay can detect is 138 copies/mL. A negative result does not preclude SARS-Cov-2 infection and should not be used as the sole basis for treatment or other patient management decisions. A negative result may occur with  improper specimen collection/handling, submission of specimen other than nasopharyngeal swab, presence of viral mutation(s) within the areas targeted by this assay, and inadequate number of viral copies(<138 copies/mL). A negative result must be combined with clinical observations, patient history, and epidemiological  information. The expected result is Negative.  Fact Sheet for Patients:  EntrepreneurPulse.com.au  Fact Sheet for Healthcare Providers:  IncredibleEmployment.be  This test is no                          t yet approved or cleared by the [place] FDA and  has been authorized for detection and/or diagnosis of SARS-CoV-2 by FDA under an Emergency Use Authorization (EUA). This EUA will remain  in effect (meaning this test can be used) for the duration of the COVID-19 declaration under  Section 564(b)(1) of the Act, 21 U.S.C.section 360bbb-3(b)(1), unless the authorization is terminated  or revoked sooner.       Influenza A by PCR 01/20/2022 NEGATIVE  NEGATIVE Final   Influenza B by PCR 01/20/2022 NEGATIVE  NEGATIVE Final   Comment: (NOTE) The Xpert Xpress SARS-CoV-2/FLU/RSV plus assay is intended as an aid in the diagnosis of influenza from Nasopharyngeal swab specimens and should not be used as a sole basis for treatment. Nasal washings and aspirates are unacceptable for Xpert Xpress SARS-CoV-2/FLU/RSV testing.  Fact Sheet for Patients: EntrepreneurPulse.com.au  Fact Sheet for Healthcare Providers: IncredibleEmployment.be  This test is not yet approved or cleared by the [place] FDA and has been authorized for detection and/or diagnosis of SARS-CoV-2 by FDA under an Emergency Use Authorization (EUA). This EUA will remain in effect (meaning this test can be used) for the duration of the COVID-19 declaration under Section 564(b)(1) of the Act, 21 U.S.C. section 360bbb-3(b)(1), unless the authorization is terminated or revoked.  Performed at [facility], [place] [address] [place], [place] [address]    Sodium 01/20/2022 136  135 - 145 mmol/L Final   Potassium 01/20/2022 4.0  3.5 - 5.1 mmol/L Final   Chloride 01/20/2022 103  98 - 111 mmol/L Final   CO2 01/20/2022 24  22 - 32 mmol/L Final   Glucose, Bld 01/20/2022 360 (H)  70 - 99 mg/dL Final   Glucose reference range applies only to samples taken after fasting for at least 8 hours.   BUN 01/20/2022 12  8 - 23 mg/dL Final   Creatinine, Ser 01/20/2022 1.01  0.61 - 1.24 mg/dL Final   Calcium 01/20/2022 9.4  8.9 - 10.3 mg/dL Final   Total Protein 01/20/2022 7.3  6.5 - 8.1 g/dL Final   Albumin 01/20/2022 3.4 (L)  3.5 - 5.0 g/dL Final   AST 01/20/2022 14 (L)  15 - 41 U/L Final   ALT 01/20/2022 10  0 - 44 U/L Final   Alkaline Phosphatase 01/20/2022 101  38 - 126 U/L  Final   Total Bilirubin 01/20/2022 0.3  0.3 - 1.2 mg/dL Final   GFR, Estimated 01/20/2022 >60  >60 mL/min Final   Comment: (NOTE) Calculated using the CKD-EPI Creatinine Equation (2021)    Anion gap 01/20/2022 9  5 - 15 Final   Performed at [facility] [address] [place], [place] [address]   Alcohol, Ethyl (B) 01/20/2022 <10  <10 mg/dL Final   Comment: (NOTE) Lowest detectable limit for serum alcohol is 10 mg/dL.  For medical purposes only. Performed at [facility], [place] [address], [place], [place] [address]    Opiates 01/20/2022 NONE DETECTED  NONE DETECTED Final   Cocaine 01/20/2022 POSITIVE (A)  NONE DETECTED Final   Benzodiazepines 01/20/2022 NONE DETECTED  NONE DETECTED Final   Amphetamines 01/20/2022 NONE DETECTED  NONE DETECTED Final  Tetrahydrocannabinol 01/20/2022 NONE DETECTED  NONE DETECTED Final   Barbiturates 01/20/2022 NONE DETECTED  NONE DETECTED Final   Comment: (NOTE) DRUG SCREEN FOR MEDICAL PURPOSES ONLY.  IF CONFIRMATION IS NEEDED FOR ANY PURPOSE, NOTIFY LAB WITHIN 5 DAYS.  LOWEST DETECTABLE LIMITS FOR URINE DRUG SCREEN Drug Class                     Cutoff (ng/mL) Amphetamine and metabolites    1000 Barbiturate and metabolites    200 Benzodiazepine                 200 Opiates and metabolites        300 Cocaine and metabolites        300 THC                            50 Performed at [facility], [place] [address] [place], [place] [address]    WBC 01/20/2022 5.2  4.0 - 10.5 K/uL Final   RBC 01/20/2022 4.73  4.22 - 5.81 MIL/uL Final   Hemoglobin 01/20/2022 14.3  13.0 - 17.0 g/dL Final   HCT 01/20/2022 43.1  39.0 - 52.0 % Final   MCV 01/20/2022 91.1  80.0 - 100.0 fL Final   MCH 01/20/2022 30.2  26.0 - 34.0 pg Final   MCHC 01/20/2022 33.2  30.0 - 36.0 g/dL Final   RDW 01/20/2022 14.1  11.5 - 15.5 % Final   Platelets 01/20/2022 444 (H)  150 - 400 K/uL Final   nRBC 01/20/2022 0.0  0.0 - 0.2 % Final   Neutrophils Relative %  01/20/2022 61  % Final   Neutro Abs 01/20/2022 3.2  1.7 - 7.7 K/uL Final   Lymphocytes Relative 01/20/2022 29  % Final   Lymphs Abs 01/20/2022 1.5  0.7 - 4.0 K/uL Final   Monocytes Relative 01/20/2022 7  % Final   Monocytes Absolute 01/20/2022 0.4  0.1 - 1.0 K/uL Final   Eosinophils Relative 01/20/2022 2  % Final   Eosinophils Absolute 01/20/2022 0.1  0.0 - 0.5 K/uL Final   Basophils Relative 01/20/2022 1  % Final   Basophils Absolute 01/20/2022 0.0  0.0 - 0.1 K/uL Final   Immature Granulocytes 01/20/2022 0  % Final   Abs Immature Granulocytes 01/20/2022 0.01  0.00 - 0.07 K/uL Final   Performed at [facility], [place] [address] [place], [place] [address]   Salicylate Lvl 54/00/8676 <7.0 (L)  7.0 - 30.0 mg/dL Final   Performed at [facility] [address], [place], [place] [address]   Acetaminophen (Tylenol), Serum 01/20/2022 <10 (L)  10 - 30 ug/mL Final   Comment: (NOTE) Therapeutic concentrations vary significantly. A range of 10-30 ug/mL  may be an effective concentration for many patients. However, some  are best treated at concentrations outside of this range. Acetaminophen concentrations >150 ug/mL at 4 hours after ingestion  and >50 ug/mL at 12 hours after ingestion are often associated with  toxic reactions.  Performed at [facility], [place] [address], [place], [place] [address]    Glucose-Capillary 01/20/2022 344 (H)  70 - 99 mg/dL Final   Glucose reference range applies only to samples taken after fasting for at least 8 hours.   Comment 1 01/20/2022 Notify RN   Final   Comment 2 01/20/2022 Document in Chart   Final   Glucose-Capillary 01/20/2022 479 (H)  70 -  99 mg/dL Final   Glucose reference range applies only to samples taken after fasting for at least 8 hours.   Glucose-Capillary 01/21/2022 243 (H)  70 - 99 mg/dL Final   Glucose reference range applies only to samples taken after fasting for at least 8 hours.   Glucose-Capillary 01/21/2022 224 (H)   70 - 99 mg/dL Final   Glucose reference range applies only to samples taken after fasting for at least 8 hours.   Glucose-Capillary 01/21/2022 282 (H)  70 - 99 mg/dL Final   Glucose reference range applies only to samples taken after fasting for at least 8 hours.  Admission on 01/18/2022, Discharged on 01/19/2022  Component Date Value Ref Range Status   WBC 01/18/2022 5.3  4.0 - 10.5 K/uL Final   RBC 01/18/2022 4.47  4.22 - 5.81 MIL/uL Final   Hemoglobin 01/18/2022 13.7  13.0 - 17.0 g/dL Final   HCT 01/18/2022 41.0  39.0 - 52.0 % Final   MCV 01/18/2022 91.7  80.0 - 100.0 fL Final   MCH 01/18/2022 30.6  26.0 - 34.0 pg Final   MCHC 01/18/2022 33.4  30.0 - 36.0 g/dL Final   RDW 01/18/2022 14.2  11.5 - 15.5 % Final   Platelets 01/18/2022 445 (H)  150 - 400 K/uL Final   nRBC 01/18/2022 0.0  0.0 - 0.2 % Final   Performed at [facility], [place] [address] [place], [place] [address]   Sodium 01/18/2022 136  135 - 145 mmol/L Final   Potassium 01/18/2022 4.3  3.5 - 5.1 mmol/L Final   Chloride 01/18/2022 100  98 - 111 mmol/L Final   CO2 01/18/2022 22  22 - 32 mmol/L Final   Glucose, Bld 01/18/2022 499 (H)  70 - 99 mg/dL Final   Glucose reference range applies only to samples taken after fasting for at least 8 hours.   BUN 01/18/2022 9  8 - 23 mg/dL Final   Creatinine, Ser 01/18/2022 1.23  0.61 - 1.24 mg/dL Final   Calcium 01/18/2022 9.1  8.9 - 10.3 mg/dL Final   Total Protein 01/18/2022 6.6  6.5 - 8.1 g/dL Final   Albumin 01/18/2022 3.2 (L)  3.5 - 5.0 g/dL Final   AST 01/18/2022 18  15 - 41 U/L Final   ALT 01/18/2022 9  0 - 44 U/L Final   Alkaline Phosphatase 01/18/2022 95  38 - 126 U/L Final   Total Bilirubin 01/18/2022 0.3  0.3 - 1.2 mg/dL Final   GFR, Estimated 01/18/2022 >60  >60 mL/min Final   Comment: (NOTE) Calculated using the CKD-EPI Creatinine Equation (2021)    Anion gap 01/18/2022 14  5 - 15 Final   Performed at [place] [address], [place], [place] [address]    Color, Urine 01/18/2022 YELLOW  YELLOW Final   APPearance 01/18/2022 CLEAR  CLEAR Final   Specific Gravity, Urine 01/18/2022 1.025  1.005 - 1.030 Final   pH 01/18/2022 6.0  5.0 - 8.0 Final   Glucose, UA 01/18/2022 >=500 (A)  NEGATIVE mg/dL Final   Hgb urine dipstick 01/18/2022 NEGATIVE  NEGATIVE Final   Bilirubin Urine 01/18/2022 NEGATIVE  NEGATIVE Final   Ketones, ur 01/18/2022 NEGATIVE  NEGATIVE mg/dL Final   Protein, ur 01/18/2022 NEGATIVE  NEGATIVE mg/dL Final   Nitrite 01/18/2022 NEGATIVE  NEGATIVE Final   Leukocytes,Ua 01/18/2022 SMALL (A)  NEGATIVE Final   RBC / HPF 01/18/2022 0-5  0 - 5 RBC/hpf Final   WBC, UA 01/18/2022 21-50  0 - 5 WBC/hpf Final   Bacteria, UA 01/18/2022 RARE (A)  NONE SEEN Final   Squamous Epithelial / LPF 01/18/2022 0-5  0 - 5 Final   Budding Yeast 01/18/2022 PRESENT   Final   Performed at [facility], [place] [address] [place], [place] [address]   Opiates 01/18/2022 NONE DETECTED  NONE DETECTED Final   Cocaine 01/18/2022 POSITIVE (A)  NONE DETECTED Final   Benzodiazepines 01/18/2022 NONE DETECTED  NONE DETECTED Final   Amphetamines 01/18/2022 NONE DETECTED  NONE DETECTED Final   Tetrahydrocannabinol 01/18/2022 NONE DETECTED  NONE DETECTED Final   Barbiturates 01/18/2022 NONE DETECTED  NONE DETECTED Final   Comment: (NOTE) DRUG SCREEN FOR MEDICAL PURPOSES ONLY.  IF CONFIRMATION IS NEEDED FOR ANY PURPOSE, NOTIFY LAB WITHIN 5 DAYS.  LOWEST DETECTABLE LIMITS FOR URINE DRUG SCREEN Drug Class                     Cutoff (ng/mL) Amphetamine and metabolites    1000 Barbiturate and metabolites    200 Benzodiazepine                 200 Opiates and metabolites        300 Cocaine and metabolites        300 THC                            50 Performed at [facility], [place] [address] [place], [place] [address]    Glucose-Capillary 01/18/2022 483 (H)  70 - 99 mg/dL Final   Glucose reference range applies only to samples taken after fasting for at  least 8 hours.   Glucose-Capillary 01/19/2022 586 (HH)  70 - 99 mg/dL Final   Glucose reference range applies only to samples taken after fasting for at least 8 hours.   Glucose-Capillary 01/19/2022 456 (H)  70 - 99 mg/dL Final   Glucose reference range applies only to samples taken after fasting for at least 8 hours.   Glucose-Capillary 01/19/2022 515 (HH)  70 - 99 mg/dL Final   Glucose reference range applies only to samples taken after fasting for at least 8 hours.   Comment 1 01/19/2022 Notify RN   Final   Glucose-Capillary 01/19/2022 164 (H)  70 - 99 mg/dL Final   Glucose reference range applies only to samples taken after fasting for at least 8 hours.  Admission on 01/07/2022, Discharged on 01/07/2022  Component Date Value Ref Range Status   Glucose-Capillary 01/07/2022 267 (H)  70 - 99 mg/dL Final   Glucose reference range applies only to samples taken after fasting for at least 8 hours.  Admission on 01/02/2022, Discharged on 01/02/2022  Component Date Value Ref Range Status   WBC 01/02/2022 7.0  4.0 - 10.5 K/uL Final   RBC 01/02/2022 4.54  4.22 - 5.81 MIL/uL Final   Hemoglobin 01/02/2022 13.9  13.0 - 17.0 g/dL Final   HCT 01/02/2022 41.4  39.0 - 52.0 % Final   MCV 01/02/2022 91.2  80.0 - 100.0 fL Final   MCH 01/02/2022 30.6  26.0 - 34.0 pg Final   MCHC 01/02/2022 33.6  30.0 - 36.0 g/dL Final   RDW 01/02/2022 14.1  11.5 - 15.5 % Final   Platelets 01/02/2022 369  150 - 400 K/uL Final   nRBC 01/02/2022 0.0  0.0 - 0.2 % Final   Neutrophils Relative % 01/02/2022 59  % Final  Neutro Abs 01/02/2022 4.1  1.7 - 7.7 K/uL Final   Lymphocytes Relative 01/02/2022 28  % Final   Lymphs Abs 01/02/2022 1.9  0.7 - 4.0 K/uL Final   Monocytes Relative 01/02/2022 8  % Final   Monocytes Absolute 01/02/2022 0.5  0.1 - 1.0 K/uL Final   Eosinophils Relative 01/02/2022 4  % Final   Eosinophils Absolute 01/02/2022 0.3  0.0 - 0.5 K/uL Final   Basophils Relative 01/02/2022 1  % Final   Basophils  Absolute 01/02/2022 0.1  0.0 - 0.1 K/uL Final   Immature Granulocytes 01/02/2022 0  % Final   Abs Immature Granulocytes 01/02/2022 0.02  0.00 - 0.07 K/uL Final   Performed at [facility] [address], [place], [place] [address]   Sodium 01/02/2022 138  135 - 145 mmol/L Final   Potassium 01/02/2022 4.1  3.5 - 5.1 mmol/L Final   Chloride 01/02/2022 104  98 - 111 mmol/L Final   CO2 01/02/2022 25  22 - 32 mmol/L Final   Glucose, Bld 01/02/2022 216 (H)  70 - 99 mg/dL Final   Glucose reference range applies only to samples taken after fasting for at least 8 hours.   BUN 01/02/2022 11  8 - 23 mg/dL Final   Creatinine, Ser 01/02/2022 0.98  0.61 - 1.24 mg/dL Final   Calcium 01/02/2022 9.5  8.9 - 10.3 mg/dL Final   Total Protein 01/02/2022 7.4  6.5 - 8.1 g/dL Final   Albumin 01/02/2022 3.4 (L)  3.5 - 5.0 g/dL Final   AST 01/02/2022 12 (L)  15 - 41 U/L Final   ALT 01/02/2022 11  0 - 44 U/L Final   Alkaline Phosphatase 01/02/2022 90  38 - 126 U/L Final   Total Bilirubin 01/02/2022 0.9  0.3 - 1.2 mg/dL Final   GFR, Estimated 01/02/2022 >60  >60 mL/min Final   Comment: (NOTE) Calculated using the CKD-EPI Creatinine Equation (2021)    Anion gap 01/02/2022 9  5 - 15 Final   Performed at [facility] [address], [place], [place] [address]  [facility] on 11/01/2021  Component Date Value Ref Range Status   POC Glucose 11/27/2021 301 (A)  70 - 99 mg/dl Final   1 hr after lunch, had not taken insulin  Admission on 10/13/2021, Discharged on 10/15/2021  Component Date Value Ref Range Status   Glucose-Capillary 10/13/2021 388 (H)  70 - 99 mg/dL Final   Glucose reference range applies only to samples taken after fasting for at least 8 hours.   SARS Coronavirus 2 by RT PCR 10/13/2021 NEGATIVE  NEGATIVE Final   Comment: (NOTE) SARS-CoV-2 target nucleic acids are NOT DETECTED.  The SARS-CoV-2 RNA is generally detectable in upper respiratory specimens during the acute  phase of infection. The lowest concentration of SARS-CoV-2 viral copies this assay can detect is 138 copies/mL. A negative result does not preclude SARS-Cov-2 infection and should not be used as the sole basis for treatment or other patient management decisions. A negative result may occur with  improper specimen collection/handling, submission of specimen other than nasopharyngeal swab, presence of viral mutation(s) within the areas targeted by this assay, and inadequate number of viral copies(<138 copies/mL). A negative result must be combined with clinical observations, patient history, and epidemiological information. The expected result is Negative.  Fact Sheet for Patients:  EntrepreneurPulse.com.au  Fact Sheet for Healthcare Providers:  IncredibleEmployment.be  This test is no  t yet approved or cleared by the [place] and  has been authorized for detection and/or diagnosis of SARS-CoV-2 by FDA under an Emergency Use Authorization (EUA). This EUA will remain  in effect (meaning this test can be used) for the duration of the COVID-19 declaration under Section 564(b)(1) of the Act, 21 U.S.C.section 360bbb-3(b)(1), unless the authorization is terminated  or revoked sooner.       Influenza A by PCR 10/13/2021 NEGATIVE  NEGATIVE Final   Influenza B by PCR 10/13/2021 NEGATIVE  NEGATIVE Final   Comment: (NOTE) The Xpert Xpress SARS-CoV-2/FLU/RSV plus assay is intended as an aid in the diagnosis of influenza from Nasopharyngeal swab specimens and should not be used as a sole basis for treatment. Nasal washings and aspirates are unacceptable for Xpert Xpress SARS-CoV-2/FLU/RSV testing.  Fact Sheet for Patients: EntrepreneurPulse.com.au  Fact Sheet for Healthcare Providers: IncredibleEmployment.be  This test is not yet approved or cleared by the [place] FDA and has  been authorized for detection and/or diagnosis of SARS-CoV-2 by FDA under an Emergency Use Authorization (EUA). This EUA will remain in effect (meaning this test can be used) for the duration of the COVID-19 declaration under Section 564(b)(1) of the Act, 21 U.S.C. section 360bbb-3(b)(1), unless the authorization is terminated or revoked.  Performed at [facility], [place] [address] [place], [place] [address]    WBC 10/13/2021 9.5  4.0 - 10.5 K/uL Final   RBC 10/13/2021 4.23  4.22 - 5.81 MIL/uL Final   Hemoglobin 10/13/2021 13.0  13.0 - 17.0 g/dL Final   HCT 10/13/2021 40.4  39.0 - 52.0 % Final   MCV 10/13/2021 95.5  80.0 - 100.0 fL Final   MCH 10/13/2021 30.7  26.0 - 34.0 pg Final   MCHC 10/13/2021 32.2  30.0 - 36.0 g/dL Final   RDW 10/13/2021 13.4  11.5 - 15.5 % Final   Platelets 10/13/2021 551 (H)  150 - 400 K/uL Final   nRBC 10/13/2021 0.0  0.0 - 0.2 % Final   Neutrophils Relative % 10/13/2021 68  % Final   Neutro Abs 10/13/2021 6.5  1.7 - 7.7 K/uL Final   Lymphocytes Relative 10/13/2021 18  % Final   Lymphs Abs 10/13/2021 1.7  0.7 - 4.0 K/uL Final   Monocytes Relative 10/13/2021 9  % Final   Monocytes Absolute 10/13/2021 0.8  0.1 - 1.0 K/uL Final   Eosinophils Relative 10/13/2021 3  % Final   Eosinophils Absolute 10/13/2021 0.3  0.0 - 0.5 K/uL Final   Basophils Relative 10/13/2021 1  % Final   Basophils Absolute 10/13/2021 0.1  0.0 - 0.1 K/uL Final   Immature Granulocytes 10/13/2021 1  % Final   Abs Immature Granulocytes 10/13/2021 0.11 (H)  0.00 - 0.07 K/uL Final   Performed at [facility], [place] [address], [place], [place] [address]   Sodium 10/13/2021 136  135 - 145 mmol/L Final   Potassium 10/13/2021 4.3  3.5 - 5.1 mmol/L Final   Chloride 10/13/2021 104  98 - 111 mmol/L Final   CO2 10/13/2021 24  22 - 32 mmol/L Final   Glucose, Bld 10/13/2021 296 (H)  70 - 99 mg/dL Final   Glucose reference range applies only to samples taken after fasting for at least 8  hours.   BUN 10/13/2021 14  8 - 23 mg/dL Final   Creatinine, Ser 10/13/2021 1.15  0.61 - 1.24 mg/dL Final   Calcium 10/13/2021 9.0  8.9 - 10.3 mg/dL Final  Total Protein 10/13/2021 7.5  6.5 - 8.1 g/dL Final   Albumin 10/13/2021 3.0 (L)  3.5 - 5.0 g/dL Final   AST 10/13/2021 13 (L)  15 - 41 U/L Final   ALT 10/13/2021 10  0 - 44 U/L Final   Alkaline Phosphatase 10/13/2021 84  38 - 126 U/L Final   Total Bilirubin 10/13/2021 0.5  0.3 - 1.2 mg/dL Final   GFR, Estimated 10/13/2021 >60  >60 mL/min Final   Comment: (NOTE) Calculated using the CKD-EPI Creatinine Equation (2021)    Anion gap 10/13/2021 8  5 - 15 Final   Performed at [facility], [place] [address] [place], [place] [address]   Color, Urine 10/13/2021 YELLOW  YELLOW Final   APPearance 10/13/2021 CLOUDY (A)  CLEAR Final   Specific Gravity, Urine 10/13/2021 1.022  1.005 - 1.030 Final   pH 10/13/2021 5.0  5.0 - 8.0 Final   Glucose, UA 10/13/2021 >=500 (A)  NEGATIVE mg/dL Final   Hgb urine dipstick 10/13/2021 LARGE (A)  NEGATIVE Final   Bilirubin Urine 10/13/2021 NEGATIVE  NEGATIVE Final   Ketones, ur 10/13/2021 NEGATIVE  NEGATIVE mg/dL Final   Protein, ur 10/13/2021 30 (A)  NEGATIVE mg/dL Final   Nitrite 10/13/2021 NEGATIVE  NEGATIVE Final   Leukocytes,Ua 10/13/2021 LARGE (A)  NEGATIVE Final   RBC / HPF 10/13/2021 >50 (H)  0 - 5 RBC/hpf Final   WBC, UA 10/13/2021 >50 (H)  0 - 5 WBC/hpf Final   Bacteria, UA 10/13/2021 RARE (A)  NONE SEEN Final   Squamous Epithelial / LPF 10/13/2021 0-5  0 - 5 Final   WBC Clumps 10/13/2021 PRESENT   Final   Mucus 10/13/2021 PRESENT   Final   Budding Yeast 10/13/2021 PRESENT   Final   Non Squamous Epithelial 10/13/2021 0-5 (A)  NONE SEEN Final   Performed at [facility], [place] [address], [place], [place] [address]   pH, Ven 10/13/2021 7.457 (H)  7.25 - 7.43 Final   pCO2, Ven 10/13/2021 38.1 (L)  44 - 60 mmHg Final   pO2, Ven 10/13/2021 174 (H)  32 - 45 mmHg Final   Bicarbonate  10/13/2021 26.9  20.0 - 28.0 mmol/L Final   TCO2 10/13/2021 28  22 - 32 mmol/L Final   O2 Saturation 10/13/2021 100  % Final   Acid-Base Excess 10/13/2021 3.0 (H)  0.0 - 2.0 mmol/L Final   Sodium 10/13/2021 137  135 - 145 mmol/L Final   Potassium 10/13/2021 4.3  3.5 - 5.1 mmol/L Final   Calcium, Ion 10/13/2021 1.10 (L)  1.15 - 1.40 mmol/L Final   HCT 10/13/2021 40.0  39.0 - 52.0 % Final   Hemoglobin 10/13/2021 13.6  13.0 - 17.0 g/dL Final   Sample type 10/13/2021 VENOUS   Final   Opiates 10/13/2021 NONE DETECTED  NONE DETECTED Final   Cocaine 10/13/2021 POSITIVE (A)  NONE DETECTED Final   Benzodiazepines 10/13/2021 NONE DETECTED  NONE DETECTED Final   Amphetamines 10/13/2021 NONE DETECTED  NONE DETECTED Final   Tetrahydrocannabinol 10/13/2021 NONE DETECTED  NONE DETECTED Final   Barbiturates 10/13/2021 NONE DETECTED  NONE DETECTED Final   Comment: (NOTE) DRUG SCREEN FOR MEDICAL PURPOSES ONLY.  IF CONFIRMATION IS NEEDED FOR ANY PURPOSE, NOTIFY LAB WITHIN 5 DAYS.  LOWEST DETECTABLE LIMITS FOR URINE DRUG SCREEN Drug Class                     Cutoff (ng/mL) Amphetamine and metabolites  1000 Barbiturate and metabolites    200 Benzodiazepine                 767 Tricyclics and metabolites     300 Opiates and metabolites        300 Cocaine and metabolites        300 THC                            50 Performed at [facility], [place] [address] [place], [place] [address]    Adenovirus 10/13/2021 NOT DETECTED  NOT DETECTED Final   Coronavirus 229E 10/13/2021 NOT DETECTED  NOT DETECTED Final   Comment: (NOTE) The Coronavirus on the Respiratory Panel, DOES NOT test for the novel  Coronavirus (2019 nCoV)    Coronavirus HKU1 10/13/2021 NOT DETECTED  NOT DETECTED Final   Coronavirus NL63 10/13/2021 NOT DETECTED  NOT DETECTED Final   Coronavirus OC43 10/13/2021 NOT DETECTED  NOT DETECTED Final   Metapneumovirus 10/13/2021 NOT DETECTED  NOT DETECTED Final   Rhinovirus / Enterovirus  10/13/2021 NOT DETECTED  NOT DETECTED Final   Influenza A 10/13/2021 NOT DETECTED  NOT DETECTED Final   Influenza B 10/13/2021 NOT DETECTED  NOT DETECTED Final   Parainfluenza Virus 1 10/13/2021 NOT DETECTED  NOT DETECTED Final   Parainfluenza Virus 2 10/13/2021 NOT DETECTED  NOT DETECTED Final   Parainfluenza Virus 3 10/13/2021 NOT DETECTED  NOT DETECTED Final   Parainfluenza Virus 4 10/13/2021 NOT DETECTED  NOT DETECTED Final   Respiratory Syncytial Virus 10/13/2021 NOT DETECTED  NOT DETECTED Final   Bordetella pertussis 10/13/2021 NOT DETECTED  NOT DETECTED Final   Bordetella Parapertussis 10/13/2021 NOT DETECTED  NOT DETECTED Final   Chlamydophila pneumoniae 10/13/2021 NOT DETECTED  NOT DETECTED Final   Mycoplasma pneumoniae 10/13/2021 NOT DETECTED  NOT DETECTED Final   Performed at [facility], [place] [address] [place], [place] [address]   Procalcitonin 10/13/2021 <0.10  ng/mL Final   Comment:        Interpretation: PCT (Procalcitonin) <= 0.5 ng/mL: Systemic infection (sepsis) is not likely. Local bacterial infection is possible. (NOTE)       Sepsis PCT Algorithm           Lower Respiratory Tract                                      Infection PCT Algorithm    ----------------------------     ----------------------------         PCT < 0.25 ng/mL                PCT < 0.10 ng/mL          Strongly encourage             Strongly discourage   discontinuation of antibiotics    initiation of antibiotics    ----------------------------     -----------------------------       PCT 0.25 - 0.50 ng/mL            PCT 0.10 - 0.25 ng/mL               OR       >80% decrease in PCT            Discourage initiation of  antibiotics      Encourage discontinuation           of antibiotics    ----------------------------     -----------------------------         PCT >= 0.50 ng/mL              PCT 0.26 - 0.50 ng/mL               AND                                  <80% decrease in PCT             Encourage initiation of                                             antibiotics       Encourage continuation           of antibiotics    ----------------------------     -----------------------------        PCT >= 0.50 ng/mL                  PCT > 0.50 ng/mL               AND         increase in PCT                  Strongly encourage                                      initiation of antibiotics    Strongly encourage escalation           of antibiotics                                     -----------------------------                                           PCT <= 0.25 ng/mL                                                 OR                                        > 80% decrease in PCT                                      Discontinue / Do not initiate                                             antibiotics  Performed  at [facility], [place] [address], [place], [place] [address]    B Natriuretic Peptide 10/13/2021 23.6  0.0 - 100.0 pg/mL Final   Performed at [facility] [address], [place], [place] [address]   L. pneumophila Serogp 1 Ur Ag 10/13/2021 Negative  Negative Final   Comment: (NOTE) Presumptive negative for L. pneumophila serogroup 1 antigen in urine, suggesting no recent or current infection. Legionnaires' disease cannot be ruled out since other serogroups and species may also cause disease. Performed At: [facility] [place], [place] [id number] Rush Farmer MD [id number]    Source of Sample 10/13/2021 URINE, RANDOM   Final   Performed at [facility], [place] [address], [place], [place] [address]   Strep Pneumo Urinary Antigen 10/13/2021 NEGATIVE  NEGATIVE Final   Comment:        Infection due to S. pneumoniae cannot be absolutely ruled out since the antigen present may be below the detection limit of the test. Performed at [facility], [address],  [place], [place] [address]    Sodium 10/14/2021 138  135 - 145 mmol/L Final   Potassium 10/14/2021 4.3  3.5 - 5.1 mmol/L Final   Chloride 10/14/2021 106  98 - 111 mmol/L Final   CO2 10/14/2021 25  22 - 32 mmol/L Final   Glucose, Bld 10/14/2021 286 (H)  70 - 99 mg/dL Final   Glucose reference range applies only to samples taken after fasting for at least 8 hours.   BUN 10/14/2021 14  8 - 23 mg/dL Final   Creatinine, Ser 10/14/2021 0.98  0.61 - 1.24 mg/dL Final   Calcium 10/14/2021 8.9  8.9 - 10.3 mg/dL Final   GFR, Estimated 10/14/2021 >60  >60 mL/min Final   Comment: (NOTE) Calculated using the CKD-EPI Creatinine Equation (2021)    Anion gap 10/14/2021 7  5 - 15 Final   Performed at [facility], [place] [address], [place], [place] [address]   WBC 10/14/2021 9.6  4.0 - 10.5 K/uL Final   RBC 10/14/2021 4.12 (L)  4.22 - 5.81 MIL/uL Final   Hemoglobin 10/14/2021 12.6 (L)  13.0 - 17.0 g/dL Final   HCT 10/14/2021 38.8 (L)  39.0 - 52.0 % Final   MCV 10/14/2021 94.2  80.0 - 100.0 fL Final   MCH 10/14/2021 30.6  26.0 - 34.0 pg Final   MCHC 10/14/2021 32.5  30.0 - 36.0 g/dL Final   RDW 10/14/2021 13.4  11.5 - 15.5 % Final   Platelets 10/14/2021 556 (H)  150 - 400 K/uL Final   nRBC 10/14/2021 0.0  0.0 - 0.2 % Final   Performed at [facility], [place] [address], [place], [place] [address]   Glucose-Capillary 10/13/2021 281 (H)  70 - 99 mg/dL Final   Glucose reference range applies only to samples taken after fasting for at least 8 hours.   Glucose-Capillary 10/13/2021 399 (H)  70 - 99 mg/dL Final   Glucose reference range applies only to samples taken after fasting for at least 8 hours.   Glucose-Capillary 10/14/2021 239 (H)  70 - 99 mg/dL Final   Glucose reference range applies only to samples taken after fasting for at least 8 hours.   Glucose-Capillary 10/14/2021 345 (H)  70 - 99 mg/dL Final   Glucose reference range applies only to samples taken after fasting for at least 8 hours.    Glucose-Capillary 10/14/2021 383 (H)  70 -  99 mg/dL Final   Glucose reference range applies only to samples taken after fasting for at least 8 hours.   Glucose-Capillary 10/15/2021 194 (H)  70 - 99 mg/dL Final   Glucose reference range applies only to samples taken after fasting for at least 8 hours.   Glucose-Capillary 10/15/2021 161 (H)  70 - 99 mg/dL Final   Glucose reference range applies only to samples taken after fasting for at least 8 hours.   Glucose-Capillary 10/15/2021 312 (H)  70 - 99 mg/dL Final   Glucose reference range applies only to samples taken after fasting for at least 8 hours.   Glucose-Capillary 10/14/2021 279 (H)  70 - 99 mg/dL Final   Glucose reference range applies only to samples taken after fasting for at least 8 hours.  Office Visit on 10/08/2021  Component Date Value Ref Range Status   POC Glucose 10/08/2021 321 (A)  70 - 99 mg/dl Final   HbA1c, POC (controlled diabetic ra* 10/08/2021 9.3 (A)  0.0 - 7.0 % Final  Admission on 10/07/2021, Discharged on 10/07/2021  Component Date Value Ref Range Status   Color, Urine 10/07/2021 YELLOW  YELLOW Final   APPearance 10/07/2021 HAZY (A)  CLEAR Final   Specific Gravity, Urine 10/07/2021 1.010  1.005 - 1.030 Final   pH 10/07/2021 5.0  5.0 - 8.0 Final   Glucose, UA 10/07/2021 >=500 (A)  NEGATIVE mg/dL Final   Hgb urine dipstick 10/07/2021 MODERATE (A)  NEGATIVE Final   Bilirubin Urine 10/07/2021 NEGATIVE  NEGATIVE Final   Ketones, ur 10/07/2021 NEGATIVE  NEGATIVE mg/dL Final   Protein, ur 10/07/2021 30 (A)  NEGATIVE mg/dL Final   Nitrite 10/07/2021 NEGATIVE  NEGATIVE Final   Leukocytes,Ua 10/07/2021 LARGE (A)  NEGATIVE Final   RBC / HPF 10/07/2021 21-50  0 - 5 RBC/hpf Final   WBC, UA 10/07/2021 >50 (H)  0 - 5 WBC/hpf Final   Bacteria, UA 10/07/2021 NONE SEEN  NONE SEEN Final   WBC Clumps 10/07/2021 PRESENT   Final   Mucus 10/07/2021 PRESENT   Final   Budding Yeast 10/07/2021 PRESENT   Final   Performed at [facility], [facility] [address], [place], [place] [address]   WBC 10/07/2021 8.9  4.0 - 10.5 K/uL Final   RBC 10/07/2021 4.35  4.22 - 5.81 MIL/uL Final   Hemoglobin 10/07/2021 13.7  13.0 - 17.0 g/dL Final   HCT 10/07/2021 41.9  39.0 - 52.0 % Final   MCV 10/07/2021 96.3  80.0 - 100.0 fL Final   MCH 10/07/2021 31.5  26.0 - 34.0 pg Final   MCHC 10/07/2021 32.7  30.0 - 36.0 g/dL Final   RDW 10/07/2021 13.4  11.5 - 15.5 % Final   Platelets 10/07/2021 376  150 - 400 K/uL Final   nRBC 10/07/2021 0.0  0.0 - 0.2 % Final   Neutrophils Relative % 10/07/2021 59  % Final   Neutro Abs 10/07/2021 5.2  1.7 - 7.7 K/uL Final   Lymphocytes Relative 10/07/2021 17  % Final   Lymphs Abs 10/07/2021 1.5  0.7 - 4.0 K/uL Final   Monocytes Relative 10/07/2021 19  % Final   Monocytes Absolute 10/07/2021 1.7 (H)  0.1 - 1.0 K/uL Final   Eosinophils Relative 10/07/2021 5  % Final   Eosinophils Absolute 10/07/2021 0.5  0.0 - 0.5 K/uL Final   Basophils Relative 10/07/2021 0  % Final   Basophils Absolute 10/07/2021 0.0  0.0 - 0.1 K/uL Final   Immature Granulocytes  10/07/2021 0  % Final   Abs Immature Granulocytes 10/07/2021 0.04  0.00 - 0.07 K/uL Final   Performed at [facility] [address], [place], [place] [address]   Sodium 10/07/2021 141  135 - 145 mmol/L Final   Potassium 10/07/2021 4.0  3.5 - 5.1 mmol/L Final   Chloride 10/07/2021 107  98 - 111 mmol/L Final   CO2 10/07/2021 24  22 - 32 mmol/L Final   Glucose, Bld 10/07/2021 81  70 - 99 mg/dL Final   Glucose reference range applies only to samples taken after fasting for at least 8 hours.   BUN 10/07/2021 14  8 - 23 mg/dL Final   Creatinine, Ser 10/07/2021 1.19  0.61 - 1.24 mg/dL Final   Calcium 10/07/2021 9.4  8.9 - 10.3 mg/dL Final   Total Protein 10/07/2021 7.9  6.5 - 8.1 g/dL Final   Albumin 10/07/2021 3.5  3.5 - 5.0 g/dL Final   AST 10/07/2021 12 (L)  15 - 41 U/L Final   ALT 10/07/2021 10  0 - 44 U/L Final   Alkaline  Phosphatase 10/07/2021 73  38 - 126 U/L Final   Total Bilirubin 10/07/2021 0.7  0.3 - 1.2 mg/dL Final   GFR, Estimated 10/07/2021 >60  >60 mL/min Final   Comment: (NOTE) Calculated using the CKD-EPI Creatinine Equation (2021)    Anion gap 10/07/2021 10  5 - 15 Final   Performed at [facility], [place] [address] [place], [place] [address]   Specimen Description 10/07/2021    Final                   Value:URINE, CLEAN CATCH Performed at [facility], [place] [address] [place], [place] [address]    Special Requests 10/07/2021    Final                   Value:NONE Performed at [facility], [place] [address] [place], [place] [address]    Culture 10/07/2021  (A)   Final                   Value:40,000 COLONIES/mL YEAST 20,000 COLONIES/mL LACTOBACILLUS SPECIES Standardized susceptibility testing for this organism is not available. Performed at [facility], [place] [address], [place], [place] [address]    Report Status 10/07/2021 10/08/2021 FINAL   Final  Admission on 10/04/2021, Discharged on 10/04/2021  Component Date Value Ref Range Status   WBC 10/04/2021 9.3  4.0 - 10.5 K/uL Final   RBC 10/04/2021 4.26  4.22 - 5.81 MIL/uL Final   Hemoglobin 10/04/2021 13.4  13.0 - 17.0 g/dL Final   HCT 10/04/2021 41.5  39.0 - 52.0 % Final   MCV 10/04/2021 97.4  80.0 - 100.0 fL Final   MCH 10/04/2021 31.5  26.0 - 34.0 pg Final   MCHC 10/04/2021 32.3  30.0 - 36.0 g/dL Final   RDW 10/04/2021 13.6  11.5 - 15.5 % Final   Platelets 10/04/2021 367  150 - 400 K/uL Final   nRBC 10/04/2021 0.0  0.0 - 0.2 % Final   Performed at [facility] [place] [address] [place], [place] [address]   Sodium 10/04/2021 138  135 - 145 mmol/L Final   Potassium 10/04/2021 4.4  3.5 - 5.1 mmol/L Final   Chloride 10/04/2021 107  98 - 111 mmol/L Final   CO2 10/04/2021 22  22 - 32 mmol/L Final   Glucose, Bld 10/04/2021  236 (H)  70 - 99 mg/dL Final    Glucose reference range applies only to samples taken after fasting for at least 8 hours.   BUN 10/04/2021 13  8 - 23 mg/dL Final   Creatinine, Ser 10/04/2021 1.15  0.61 - 1.24 mg/dL Final   Calcium 10/04/2021 8.9  8.9 - 10.3 mg/dL Final   GFR, Estimated 10/04/2021 >60  >60 mL/min Final   Comment: (NOTE) Calculated using the CKD-EPI Creatinine Equation (2021)    Anion gap 10/04/2021 9  5 - 15 Final   Performed at [facility], [place] [address], [place], [place] [address]  There may be more visits with results that are not included.    Blood Alcohol level:  Lab Results  Component Value Date   ETH <10 01/20/2022   ETH <10 81/12/3157    Metabolic Disorder Labs: Lab Results  Component Value Date   HGBA1C 10.4 (H) 01/21/2022   MPG 251.78 01/21/2022   MPG >398 02/24/2021   No results found for: "PROLACTIN" Lab Results  Component Value Date   CHOL 213 (H) 05/23/2021   TRIG 115 05/23/2021   HDL 71 05/23/2021   CHOLHDL 3.2 11/03/2019   VLDL 32 (H) 05/03/2015   LDLCALC 122 (H) 05/23/2021   LDLCALC 99 11/03/2019    Therapeutic Lab Levels: No results found for: "LITHIUM" No results found for: "VALPROATE" No results found for: "CBMZ"  Physical Findings   GAD-7    [place] Office Visit from 10/08/2021 in [place] Office Visit from 09/05/2021 in [place] [facility] Office Visit from 12/26/2020 in [place] Office Visit from 11/17/2018 in [place] Office Visit from 06/05/2017 in [place]  Total GAD-7 Score _0 0 12      PHQ2-9    [place] ED from 01/21/2022 in [facility] Office Visit from 10/08/2021 in [place] Office Visit from 09/05/2021 in [place] [facility] ED from 07/02/2021 in [facility] Office Visit from 12/26/2020 in [place]  PHQ-2 Total Score _1 PHQ-9 Total Score -- _2 Flowsheet Row ED from 01/21/2022 in [facility] ED from 01/20/2022 in [place] ED from 01/18/2022 in [place] Risk Low Risk No Risk        Musculoskeletal  Strength & Muscle Tone: within normal limits Gait & Station:  walks with some limp, but is steady Patient leans: Front  Psychiatric Specialty Exam  Presentation  General Appearance:  Appropriate for Environment; Casual  Eye Contact: Good  Speech: Clear and Coherent  Speech Volume: Normal  Handedness: Right   Mood and Affect  Mood: Euthymic  Affect: Appropriate   Thought Process  Thought Processes: Coherent  Descriptions of Associations:Intact  Orientation:Full (Time, Place and Person)  Thought Content:Logical  Diagnosis of Schizophrenia or Schizoaffective disorder in past: No    Hallucinations:Hallucinations: None  Ideas of Reference:None  Suicidal Thoughts:Suicidal Thoughts: No  Homicidal Thoughts:Homicidal Thoughts: No   Sensorium  Memory: Immediate Good; Recent Good  Judgment: Fair  Insight: Good   Executive Functions  Concentration: Fair  Attention Span: Fair  Recall: [place] of Knowledge: Fair  LanguageKermit Balo  Psychomotor Activity  Psychomotor Activity: Psychomotor Activity: Decreased   Assets  Assets: Desire for Improvement; Communication Skills; Resilience   Sleep  Sleep: Sleep: Good   No data recorded  Physical Exam  Physical Exam HENT:     Head: Normocephalic and atraumatic.  Pulmonary:     Effort: Pulmonary effort is normal.  Neurological:     Mental Status: He is alert and oriented to person, place, and time.    Review of Systems  Neurological:        R LE shooting pain   Psychiatric/Behavioral:  Negative for depression, hallucinations and suicidal ideas. The patient does not have insomnia.    Blood pressure 121/84, pulse 78, temperature 97.6 F (36.4 C), temperature source Oral, resp. rate 18, SpO2 99 %. There is no height or weight on file to calculate BMI.  Treatment Plan Summary: Daily contact with patient to assess and evaluate symptoms and progress in treatment and Medication management   Patient appears to be doing well, with stable mood. He continues to crave sugar, and being around others eating what he cannot, is difficult but patient insight has improved. Did acquire glucometer for patient, should be coming for patient prior to his discharge.    Cocaine use d/o  Tobacco use d/o  Action stage. Encouraged cessation Comfort PRNs    Substance induced mood d/o Continued home Cymbalta 60 mg daily - Continue Trazodone to 11m QHS PRN     Chronic Pain - Continued gabapentin 6088mTID - Continued PRN Tylenol   DM2-insulin-dependent with neuropathy, IMPROVING Urge Incontinence  BPH A1c 10.4 (01/21/2022), from 9.7 earlier this year. Goal CBGs <180. CBGs mostly <180 with no hypoglycemic events.  After educating on uncontrolled DM2, patient continues to work diligently on diet, self administering insulin and foot exam. In regards to urge incontinence patient has been able to make it to bathroom every time during this stay. Urge incontinence has been improving with better glycemic control.  Continued home metformin 1000 mg twice daily Continued home glipizide 10 mg with breakfast Continued home empagliflozin 10 mg with breakfast Continued home long acting insulin, self administered - 24 units daily  Continued sensitive sliding scale  Continued short acting 3 units BID for hypoglycemia per above (home dose was 5 units BID) Continued Gabapentin 600 mg TID and Cymbalta per above for neuropathy Educated on self foot exam twice daily Increased home  tamsulosin 0.19m46maily to 0.8mg35mily Glucometer , lancets, and strips [facility] Hx of GERD Improving, still hungry Continue pepcid 10mg70m  Considerations for follow-up: Recommend high risk screening every 6-12 months with HIV, RPR, hepatitis panel PCP for insulin dependent DM2 - uncontrolled PCP for [facility] [place] [facility]   DISPO: Tentative date: 11/27 [place] [place]  [facility] required for insulin dependency      PGY-3  BFreida Busman11/24/2023 1:37 PM

## 2022-02-01 NOTE — ED Notes (Signed)
Patient observed/assessed in room in bed appearing in no immediate distress resting peacefully. Q15 minute checks continued by MHT and nursing staff. Will continue to monitor and support. 

## 2022-02-01 NOTE — Progress Notes (Signed)
LCSW spoke with patient on this morning for routine check in. Patient reported a good mood on this morning. Patient reported that he has been doing better with controlling his anger and impulses to others. Patient reports he is able to walk away or isolate himself to keep himself from becoming anger. Patient reports appreciation with LCSW assistance with finding him a placement for treatment, and reports he is ready to transfer. Patient made aware that we are still currently awaiting bed availability regarding day of admit. LCSW attempted to contact the facility on today to follow up, however phone went to voicemail. Patient aware that LCSW will follow up Monday morning regarding admission date and will provide update as received. No other needs were reported at this time.   Patient has been accepted to [facility] in [place], [place] however day of admission has not been confirmed. Awaiting update from facility. LCSW will continue to follow and provide support to patient while here on the [facility] unit.   Lucius Conn, LCSW Clinical Social Worker [facility] Ph: [phone]

## 2022-02-02 DIAGNOSIS — I10 Essential (primary) hypertension: Secondary | ICD-10-CM | POA: Diagnosis not present

## 2022-02-02 DIAGNOSIS — F1410 Cocaine abuse, uncomplicated: Secondary | ICD-10-CM | POA: Diagnosis not present

## 2022-02-02 DIAGNOSIS — E785 Hyperlipidemia, unspecified: Secondary | ICD-10-CM | POA: Diagnosis not present

## 2022-02-02 DIAGNOSIS — F1420 Cocaine dependence, uncomplicated: Secondary | ICD-10-CM

## 2022-02-02 DIAGNOSIS — Z720 Tobacco use: Secondary | ICD-10-CM | POA: Diagnosis not present

## 2022-02-02 LAB — GLUCOSE, CAPILLARY
Glucose-Capillary: 112 mg/dL — ABNORMAL HIGH (ref 70–99)
Glucose-Capillary: 125 mg/dL — ABNORMAL HIGH (ref 70–99)
Glucose-Capillary: 274 mg/dL — ABNORMAL HIGH (ref 70–99)
Glucose-Capillary: 239 mg/dL — ABNORMAL HIGH (ref 70–99)
Glucose-Capillary: 190 mg/dL — ABNORMAL HIGH (ref 70–99)
Glucose-Capillary: 183 mg/dL — ABNORMAL HIGH (ref 70–99)

## 2022-02-02 NOTE — ED Notes (Signed)
Dinner was given

## 2022-02-02 NOTE — ED Notes (Signed)
Pt was observed giving a male pt a hug in front of nurse's station and then kissing the male pt on the forehead. Pt then sat down on the couch in front of the nurse's station with the male pt. RN instructed pt to separate from the male pt and reminded pt of unit rules, which consist of no physical contact between pts. Pt irritable but verbalized understanding and went to dining room. David Ran, NP notified. Monitoring for safety.

## 2022-02-02 NOTE — ED Notes (Signed)
Patient A&Ox4. Patient denies SI/I and AVH. Writer had to continue to educate patient on medication and glucose checks. Patient denies any physical complaints when asked. No acute distress noted. Support and encouragement provided. Routine safety checks conducted according to facility protocol. Encouraged patient to notify staff if thoughts of harm toward self or others arise. Patient verbalize understanding and agreement. Will continue to monitor for safety.

## 2022-02-02 NOTE — ED Provider Notes (Signed)
Behavioral Health Progress Note  Date and Time: 02/02/2022 2:03 PM Name: David Wyatt MRN:  [id number]  Subjective:   Draycen Leichter is a 61 y.o. male, with PMH cocaine use disorder, tobacco use disorder, housing instability, DM2 + neuropathy, HTN, HLD, no inpatient psych admission, no suicide attempts, who initially presented to [facility] (01/20/2022) for passive SI and assistance with rehab for crack cocaine use. Patient was then IVC by EDP, which was rescinded on 01/21/2022 by Mayra Neer, MD.  He was then admitted Voluntary to [facility] (01/21/2022) via EMS for assistance with residential placement.       He reports that he is doing well today.  He reports he is tolerating detox well and that he feels alive again.  He reports that he was unable to keep up with his medications and so feels much better now that he is back on them.  He reports no symptoms of withdrawal and no cravings today.  He reports that his blood sugars have been better with watching what he eats.  He reports no issues with his medications.  He reports that he is looking forward to residential rehab on Monday.  He reports no SI, HI, or AVH.  He reports his sleep is good.  He reports appetite is doing good.  He reports no other concerns at present.   Diagnosis:  Final diagnoses:  Dermoid cyst of skin of back  Tobacco abuse  Diabetic polyneuropathy associated with type 2 diabetes mellitus (HCC)  Cocaine use disorder, severe, dependence ([place])    Total Time spent with patient: 20 minutes  Past Psychiatric History:  cocaine use disorder, tobacco use disorder, no Psychiatric Hospitalizations or Suicide Attempts Past Medical History:  Past Medical History:  Diagnosis Date   Angioedema of lips 07/28/2012   left upper (07/29/2012)   Arthritis    hands and back   Chronic back pain    Cocaine abuse (HCC)    Diabetic neuropathy (HCC)    High cholesterol    Hypertension    Neuropathy    Type II diabetes mellitus ([place])     Past  Surgical History:  Procedure Laterality Date   BACK SURGERY     CYSTOSCOPY W/ URETERAL STENT PLACEMENT Right 02/23/2021   Procedure: CYSTOSCOPY WITH RETROGRADE PYELOGRAM/URETERAL STENT PLACEMENT;  Surgeon: Janith Lima, MD;  Location: [facility];  Service: Urology;  Laterality: Right;   HERNIA REPAIR Right 04/01/2012   I & D EXTREMITY Left 12/13/2012   Procedure: IRRIGATION AND DEBRIDEMENT LEFT THUMB;  Surgeon: Tennis Must, MD;  [facility];  Service: Orthopedics;  Laterality: Left;   INCISION AND DRAINAGE OF WOUND     boil on back/notes 07/14/2008 (07/29/2012)   INGUINAL HERNIA REPAIR  04/01/2012   Procedure: HERNIA REPAIR INGUINAL ADULT;  Surgeon: Haywood Lasso, MD;  Location: [place];  Service: General;  Laterality: Right;   INGUINAL HERNIA REPAIR Left 09/06/2016   Procedure: OPEN REPAIR LEFT INGUINAL HERNIA;  Surgeon: Greer Pickerel, MD;  Location: [facility];  Service: General;  Laterality: Left;   INSERTION OF MESH Left 09/06/2016   Procedure: INSERTION OF MESH;  Surgeon: Greer Pickerel, MD;  Location: [facility] OR;  Service: General;  Laterality: Left;   TONSILLECTOMY     Family History:  Family History  Problem Relation Age of Onset   Diabetes Mother    Kidney disease Mother    Hyperlipidemia Mother    Hyperlipidemia Father    Diabetes Father    Family Psychiatric  History:  None Reported Social History:  Social History   Substance and Sexual Activity  Alcohol Use No   Alcohol/week: 0.0 standard drinks of alcohol     Social History   Substance and Sexual Activity  Drug Use Not Currently   Types: "Crack" cocaine    Social History   Socioeconomic History   Marital status: Divorced    Spouse name: Not on file   Number of children: 3   Years of education: 12   Highest education level: Not on file  Occupational History    Comment: disabled  Tobacco Use   Smoking status: Every Day    Packs/day: 0.25    Years: 30.00    Total pack years: 7.50    Types: Cigarettes   Smokeless  tobacco: Former    Quit date: 08/15/2015   Tobacco comments:    04/29/16 2  cigs daily, 10/27/17 sometimes < .25 PPD  Vaping Use   Vaping Use: Never used  Substance and Sexual Activity   Alcohol use: No    Alcohol/week: 0.0 standard drinks of alcohol   Drug use: Not Currently    Types: "Crack" cocaine   Sexual activity: Not on file  Other Topics Concern   Not on file  Social History Narrative   04/29/17   Lives in shelter   Caffeine- a lot of  tea, coffee   Social Determinants of Health   Financial Resource Strain: Not on file  Food Insecurity: Not on file  Transportation Needs: Not on file  Physical Activity: Not on file  Stress: Not on file  Social Connections: Not on file   SDOH:  SDOH Screenings   Depression (PHQ2-9): Low Risk  (01/22/2022)  Tobacco Use: High Risk (01/23/2022)   Additional Social History:                         Sleep: Good  Appetite:  Good  Current Medications:  Current Facility-Administered Medications  Medication Dose Route Frequency Provider Last Rate Last Admin   acetaminophen (TYLENOL) tablet 650 mg  650 mg Oral Q6H PRN Merrily Brittle, DO   650 mg at 01/29/22 1841   alum & mag hydroxide-simeth (MAALOX/MYLANTA) 200-200-20 MG/5ML suspension 30 mL  30 mL Oral Q4H PRN Merrily Brittle, DO       amLODipine (NORVASC) tablet 10 mg  10 mg Oral Daily Merrily Brittle, DO   10 mg at 02/02/22 0912   atorvastatin (LIPITOR) tablet 40 mg  40 mg Oral Daily Merrily Brittle, DO   40 mg at 02/02/22 0911   DULoxetine (CYMBALTA) DR capsule 60 mg  60 mg Oral Daily Merrily Brittle, DO   60 mg at 02/02/22 0912   empagliflozin (JARDIANCE) tablet 10 mg  10 mg Oral QAC breakfast Merrily Brittle, DO   10 mg at 02/02/22 0841   famotidine (PEPCID) tablet 10 mg  10 mg Oral BID Cinderella, Margaret A   10 mg at 02/02/22 0912   feeding supplement (GLUCERNA SHAKE) (GLUCERNA SHAKE) liquid 237 mL  237 mL Oral TID BM Merrily Brittle, DO   237 mL at 02/02/22 0914   gabapentin  (NEURONTIN) capsule 600 mg  600 mg Oral TID Cinderella, Margaret A   600 mg at 02/02/22 0841   glipiZIDE (GLUCOTROL XL) 24 hr tablet 10 mg  10 mg Oral Q breakfast Merrily Brittle, DO   10 mg at 02/02/22 0841   insulin aspart (novoLOG) injection 0-5 Units  0-5 Units Subcutaneous QHS Onu[place], Chinwendu  V, NP   4 Units at 01/31/22 2027   insulin aspart (novoLOG) injection 0-6 Units  0-6 Units Subcutaneous TID WC Merrily Brittle, DO   3 Units at 02/02/22 1149   insulin aspart (novoLOG) injection 3 Units  3 Units Subcutaneous BID AC Merrily Brittle, DO   3 Units at 02/02/22 1148   insulin glargine-yfgn (SEMGLEE) injection 24 Units  24 Units Subcutaneous Daily Minda Ditto, RPH   24 Units at 02/02/22 2774   lubriderm seriously sensitive lotion 1 Application  1 Application Topical BID Cinderella, Margaret A   1 Application at 12/87/86 0915   magnesium hydroxide (MILK OF MAGNESIA) suspension 30 mL  30 mL Oral Daily PRN Merrily Brittle, DO       metFORMIN (GLUCOPHAGE) tablet 1,000 mg  1,000 mg Oral BID WC Merrily Brittle, DO   1,000 mg at 02/02/22 0841   nicotine (NICODERM CQ - dosed in mg/24 hours) patch 21 mg  21 mg Transdermal Q0600 Merrily Brittle, DO   21 mg at 02/02/22 0549   nicotine polacrilex (NICORETTE) gum 4 mg  4 mg Oral PRN Merrily Brittle, DO       tamsulosin [facility]) capsule 0.8 mg  0.8 mg Oral Daily Merrily Brittle, DO   0.8 mg at 02/02/22 0911   traZODone (DESYREL) tablet 100 mg  100 mg Oral QHS PRN Freida Busman, MD   100 mg at 02/01/22 1941   Current Outpatient Medications  Medication Sig Dispense Refill   Accu-Chek FastClix Lancets MISC Use as directed 100 each 0   Accu-Chek Softclix Lancets lancets Use up to 4 times daily as directed 100 each 0   amLODipine (NORVASC) 10 MG tablet Take 1 tablet (10 mg total) by mouth daily. 30 tablet 0   atorvastatin (LIPITOR) 40 MG tablet Take 1 tablet (40 mg total) by mouth daily. 30 tablet 0   Blood Glucose Monitoring Suppl (ACCU-CHEK GUIDE) w/Device KIT USE AS  DIRECTED TO CHECK BLOOD SUGARS UP TO TWICE PER DAY 1 kit 0   Blood Glucose Monitoring Suppl (ACCU-CHEK GUIDE) w/Device KIT Use as directed 1 kit 0   DULoxetine (CYMBALTA) 60 MG capsule Take 1 capsule (60 mg total) by mouth daily. 30 capsule 0   empagliflozin (JARDIANCE) 10 MG TABS tablet Take 1 tablet (10 mg total) by mouth daily before breakfast. 30 tablet 0   gabapentin (NEURONTIN) 300 MG capsule Take 2 capsules (600 mg total) by mouth 3 (three) times daily. 180 capsule 0   glipiZIDE (GLUCOTROL XL) 10 MG 24 hr tablet Take 1 tablet (10 mg total) by mouth daily with breakfast. 30 tablet 0   glucose blood (ACCU-CHEK GUIDE) test strip Use up to 3 times daily as directed 100 each 3   glucose blood (ACCU-CHEK GUIDE) test strip Use as directed 100 each 0   Insulin Glargine Solostar (LANTUS) 100 UNIT/ML Solostar Pen Inject 24 Units into the skin daily. 15 mL 2   insulin lispro (HUMALOG) 100 UNIT/ML KwikPen Inject 5 Units into the skin 2 (two) times daily before lunch and supper. (Patient not taking: Reported on 01/20/2022) 6 mL 6   Insulin Pen Needle 31G X 8 MM MISC Use with insulin pen 100 each 1   metFORMIN (GLUCOPHAGE) 1000 MG tablet Take 1 tablet (1,000 mg total) by mouth 2 (two) times daily. 60 tablet 0   tamsulosin (FLOMAX) 0.4 MG CAPS capsule Take 1 capsule (0.4 mg total) by mouth daily. For renal calculi 30 capsule 0    Labs  Lab Results:  Admission on 01/21/2022  Component Date Value Ref Range Status   Hgb A1c MFr Bld 01/21/2022 10.4 (H)  4.8 - 5.6 % Final   Comment: (NOTE) Pre diabetes:          5.7%-6.4%  Diabetes:              >6.4%  Glycemic control for   <7.0% adults with diabetes    Mean Plasma Glucose 01/21/2022 251.78  mg/dL Final   Performed at [facility], [place] [address], [place], [place] [address]   TSH 01/21/2022 0.248 (L)  0.350 - 4.500 uIU/mL Final   Comment: Performed by a 3rd Generation assay with a functional sensitivity of <=0.01 uIU/mL. Performed at [facility], [place] [address] [place], [place] [address]    Glucose-Capillary 01/21/2022 374 (H)  70 - 99 mg/dL Final   Glucose reference range applies only to samples taken after fasting for at least 8 hours.   Glucose-Capillary 01/21/2022 289 (H)  70 - 99 mg/dL Final   Glucose reference range applies only to samples taken after fasting for at least 8 hours.   Glucose-Capillary 01/22/2022 137 (H)  70 - 99 mg/dL Final   Glucose reference range applies only to samples taken after fasting for at least 8 hours.   Glucose-Capillary 01/22/2022 297 (H)  70 - 99 mg/dL Final   Glucose reference range applies only to samples taken after fasting for at least 8 hours.   T3, Free 01/23/2022 2.2  2.0 - 4.4 pg/mL Final   Comment: (NOTE) Performed At: [facility] [place], [place] [id number] Rush Farmer MD DX:[id number]    Glucose-Capillary 01/22/2022 94  70 - 99 mg/dL Final   Glucose reference range applies only to samples taken after fasting for at least 8 hours.   Free T4 01/23/2022 0.72  0.61 - 1.12 ng/dL Final   Comment: (NOTE) Biotin ingestion may interfere with free T4 tests. If the results are inconsistent with the TSH level, previous test results, or the clinical presentation, then consider biotin interference. If needed, order repeat testing after stopping biotin. Performed at [facility], [place] [address], [place], [place] [address]    TSH 01/23/2022 0.203 (L)  0.350 - 4.500 uIU/mL Final   Comment: Performed by a 3rd Generation assay with a functional sensitivity of <=0.01 uIU/mL. Performed at [facility], [place] [address] [place], [place] [address]    Glucose-Capillary 01/23/2022 132 (H)  70 - 99 mg/dL Final   Glucose reference range applies only to samples taken after fasting for at least 8 hours.   Glucose-Capillary 01/22/2022 59 (L)  70 - 99 mg/dL Final   Glucose reference range applies only to samples taken after fasting for at least 8 hours.    Glucose-Capillary 01/22/2022 177 (H)  70 - 99 mg/dL Final   Glucose reference range applies only to samples taken after fasting for at least 8 hours.   Glucose-Capillary 01/23/2022 284 (H)  70 - 99 mg/dL Final   Glucose reference range applies only to samples taken after fasting for at least 8 hours.   Glucose-Capillary 01/23/2022 188 (H)  70 - 99 mg/dL Final   Glucose reference range applies only to samples taken after fasting for at least 8 hours.   Glucose-Capillary 01/23/2022 294 (H)  70 - 99 mg/dL Final   Glucose reference range applies only to samples taken after fasting for at least 8 hours.  Glucose-Capillary 01/24/2022 180 (H)  70 - 99 mg/dL Final   Glucose reference range applies only to samples taken after fasting for at least 8 hours.   Glucose-Capillary 01/24/2022 275 (H)  70 - 99 mg/dL Final   Glucose reference range applies only to samples taken after fasting for at least 8 hours.   Glucose-Capillary 01/24/2022 146 (H)  70 - 99 mg/dL Final   Glucose reference range applies only to samples taken after fasting for at least 8 hours.   Glucose-Capillary 01/24/2022 137 (H)  70 - 99 mg/dL Final   Glucose reference range applies only to samples taken after fasting for at least 8 hours.   Glucose-Capillary 01/25/2022 201 (H)  70 - 99 mg/dL Final   Glucose reference range applies only to samples taken after fasting for at least 8 hours.   Glucose-Capillary 01/25/2022 195 (H)  70 - 99 mg/dL Final   Glucose reference range applies only to samples taken after fasting for at least 8 hours.   Glucose-Capillary 01/25/2022 149 (H)  70 - 99 mg/dL Final   Glucose reference range applies only to samples taken after fasting for at least 8 hours.   Glucose-Capillary 01/25/2022 147 (H)  70 - 99 mg/dL Final   Glucose reference range applies only to samples taken after fasting for at least 8 hours.   Glucose-Capillary 01/26/2022 123 (H)  70 - 99 mg/dL Final   Glucose reference range applies only to  samples taken after fasting for at least 8 hours.   Glucose-Capillary 01/26/2022 176 (H)  70 - 99 mg/dL Final   Glucose reference range applies only to samples taken after fasting for at least 8 hours.   Glucose-Capillary 01/26/2022 84  70 - 99 mg/dL Final   Glucose reference range applies only to samples taken after fasting for at least 8 hours.   Glucose-Capillary 01/26/2022 177 (H)  70 - 99 mg/dL Final   Glucose reference range applies only to samples taken after fasting for at least 8 hours.   Glucose-Capillary 01/27/2022 150 (H)  70 - 99 mg/dL Final   Glucose reference range applies only to samples taken after fasting for at least 8 hours.   Glucose-Capillary 01/27/2022 171 (H)  70 - 99 mg/dL Final   Glucose reference range applies only to samples taken after fasting for at least 8 hours.   Glucose-Capillary 01/27/2022 159 (H)  70 - 99 mg/dL Final   Glucose reference range applies only to samples taken after fasting for at least 8 hours.   Glucose-Capillary 01/27/2022 67 (L)  70 - 99 mg/dL Final   Glucose reference range applies only to samples taken after fasting for at least 8 hours.   Glucose-Capillary 01/27/2022 107 (H)  70 - 99 mg/dL Final   Glucose reference range applies only to samples taken after fasting for at least 8 hours.   Glucose-Capillary 01/27/2022 144 (H)  70 - 99 mg/dL Final   Glucose reference range applies only to samples taken after fasting for at least 8 hours.   Glucose-Capillary 01/28/2022 175 (H)  70 - 99 mg/dL Final   Glucose reference range applies only to samples taken after fasting for at least 8 hours.   Glucose-Capillary 01/28/2022 198 (H)  70 - 99 mg/dL Final   Glucose reference range applies only to samples taken after fasting for at least 8 hours.   Glucose-Capillary 01/28/2022 186 (H)  70 - 99 mg/dL Final   Glucose reference range applies only to samples taken  after fasting for at least 8 hours.   Glucose-Capillary 01/28/2022 198 (H)  70 - 99 mg/dL  Final   Glucose reference range applies only to samples taken after fasting for at least 8 hours.   Glucose-Capillary 01/29/2022 111 (H)  70 - 99 mg/dL Final   Glucose reference range applies only to samples taken after fasting for at least 8 hours.   Glucose-Capillary 01/29/2022 160 (H)  70 - 99 mg/dL Final   Glucose reference range applies only to samples taken after fasting for at least 8 hours.   Glucose-Capillary 01/29/2022 209 (H)  70 - 99 mg/dL Final   Glucose reference range applies only to samples taken after fasting for at least 8 hours.   Glucose-Capillary 01/29/2022 155 (H)  70 - 99 mg/dL Final   Glucose reference range applies only to samples taken after fasting for at least 8 hours.   Glucose-Capillary 01/30/2022 157 (H)  70 - 99 mg/dL Final   Glucose reference range applies only to samples taken after fasting for at least 8 hours.   Glucose-Capillary 01/30/2022 168 (H)  70 - 99 mg/dL Final   Glucose reference range applies only to samples taken after fasting for at least 8 hours.   Glucose-Capillary 01/30/2022 217 (H)  70 - 99 mg/dL Final   Glucose reference range applies only to samples taken after fasting for at least 8 hours.   Glucose-Capillary 01/30/2022 246 (H)  70 - 99 mg/dL Final   Glucose reference range applies only to samples taken after fasting for at least 8 hours.   Glucose-Capillary 01/30/2022 287 (H)  70 - 99 mg/dL Final   Glucose reference range applies only to samples taken after fasting for at least 8 hours.   Glucose-Capillary 01/31/2022 138 (H)  70 - 99 mg/dL Final   Glucose reference range applies only to samples taken after fasting for at least 8 hours.   Glucose-Capillary 01/31/2022 193 (H)  70 - 99 mg/dL Final   Glucose reference range applies only to samples taken after fasting for at least 8 hours.   Glucose-Capillary 01/31/2022 274 (H)  70 - 99 mg/dL Final   Glucose reference range applies only to samples taken after fasting for at least 8 hours.    Glucose-Capillary 01/31/2022 320 (H)  70 - 99 mg/dL Final   Glucose reference range applies only to samples taken after fasting for at least 8 hours.   Glucose-Capillary 01/31/2022 160 (H)  70 - 99 mg/dL Final   Glucose reference range applies only to samples taken after fasting for at least 8 hours.   Glucose-Capillary 02/01/2022 184 (H)  70 - 99 mg/dL Final   Glucose reference range applies only to samples taken after fasting for at least 8 hours.   Glucose-Capillary 02/01/2022 138 (H)  70 - 99 mg/dL Final   Glucose reference range applies only to samples taken after fasting for at least 8 hours.   Glucose-Capillary 02/01/2022 171 (H)  70 - 99 mg/dL Final   Glucose reference range applies only to samples taken after fasting for at least 8 hours.   Glucose-Capillary 02/01/2022 124 (H)  70 - 99 mg/dL Final   Glucose reference range applies only to samples taken after fasting for at least 8 hours.   Glucose-Capillary 02/02/2022 112 (H)  70 - 99 mg/dL Final   Glucose reference range applies only to samples taken after fasting for at least 8 hours.   Glucose-Capillary 02/02/2022 125 (H)  70 - 99 mg/dL  Final   Glucose reference range applies only to samples taken after fasting for at least 8 hours.   Glucose-Capillary 02/02/2022 274 (H)  70 - 99 mg/dL Final   Glucose reference range applies only to samples taken after fasting for at least 8 hours.  Admission on 01/20/2022, Discharged on 01/21/2022  Component Date Value Ref Range Status   SARS Coronavirus 2 by RT PCR 01/20/2022 NEGATIVE  NEGATIVE Final   Comment: (NOTE) SARS-CoV-2 target nucleic acids are NOT DETECTED.  The SARS-CoV-2 RNA is generally detectable in upper respiratory specimens during the acute phase of infection. The lowest concentration of SARS-CoV-2 viral copies this assay can detect is 138 copies/mL. A negative result does not preclude SARS-Cov-2 infection and should not be used as the sole basis for treatment or other  patient management decisions. A negative result may occur with  improper specimen collection/handling, submission of specimen other than nasopharyngeal swab, presence of viral mutation(s) within the areas targeted by this assay, and inadequate number of viral copies(<138 copies/mL). A negative result must be combined with clinical observations, patient history, and epidemiological information. The expected result is Negative.  Fact Sheet for Patients:  EntrepreneurPulse.com.au  Fact Sheet for Healthcare Providers:  IncredibleEmployment.be  This test is no                          t yet approved or cleared by the [place] FDA and  has been authorized for detection and/or diagnosis of SARS-CoV-2 by FDA under an Emergency Use Authorization (EUA). This EUA will remain  in effect (meaning this test can be used) for the duration of the COVID-19 declaration under Section 564(b)(1) of the Act, 21 U.S.C.section 360bbb-3(b)(1), unless the authorization is terminated  or revoked sooner.       Influenza A by PCR 01/20/2022 NEGATIVE  NEGATIVE Final   Influenza B by PCR 01/20/2022 NEGATIVE  NEGATIVE Final   Comment: (NOTE) The Xpert Xpress SARS-CoV-2/FLU/RSV plus assay is intended as an aid in the diagnosis of influenza from Nasopharyngeal swab specimens and should not be used as a sole basis for treatment. Nasal washings and aspirates are unacceptable for Xpert Xpress SARS-CoV-2/FLU/RSV testing.  Fact Sheet for Patients: EntrepreneurPulse.com.au  Fact Sheet for Healthcare Providers: IncredibleEmployment.be  This test is not yet approved or cleared by the [place] FDA and has been authorized for detection and/or diagnosis of SARS-CoV-2 by FDA under an Emergency Use Authorization (EUA). This EUA will remain in effect (meaning this test can be used) for the duration of the COVID-19 declaration under Section  564(b)(1) of the Act, 21 U.S.C. section 360bbb-3(b)(1), unless the authorization is terminated or revoked.  Performed at [facility], [place] [address], [place], [place] [address]    Sodium 01/20/2022 136  135 - 145 mmol/L Final   Potassium 01/20/2022 4.0  3.5 - 5.1 mmol/L Final   Chloride 01/20/2022 103  98 - 111 mmol/L Final   CO2 01/20/2022 24  22 - 32 mmol/L Final   Glucose, Bld 01/20/2022 360 (H)  70 - 99 mg/dL Final   Glucose reference range applies only to samples taken after fasting for at least 8 hours.   BUN 01/20/2022 12  8 - 23 mg/dL Final   Creatinine, Ser 01/20/2022 1.01  0.61 - 1.24 mg/dL Final   Calcium 01/20/2022 9.4  8.9 - 10.3 mg/dL Final   Total Protein 01/20/2022 7.3  6.5 - 8.1 g/dL Final   Albumin 01/20/2022  3.4 (L)  3.5 - 5.0 g/dL Final   AST 01/20/2022 14 (L)  15 - 41 U/L Final   ALT 01/20/2022 10  0 - 44 U/L Final   Alkaline Phosphatase 01/20/2022 101  38 - 126 U/L Final   Total Bilirubin 01/20/2022 0.3  0.3 - 1.2 mg/dL Final   GFR, Estimated 01/20/2022 >60  >60 mL/min Final   Comment: (NOTE) Calculated using the CKD-EPI Creatinine Equation (2021)    Anion gap 01/20/2022 9  5 - 15 Final   Performed at [facility], [place] [address] [place], [place] [address]   Alcohol, Ethyl (B) 01/20/2022 <10  <10 mg/dL Final   Comment: (NOTE) Lowest detectable limit for serum alcohol is 10 mg/dL.  For medical purposes only. Performed at [facility], [place] [address] [place], [place] [address]    Opiates 01/20/2022 NONE DETECTED  NONE DETECTED Final   Cocaine 01/20/2022 POSITIVE (A)  NONE DETECTED Final   Benzodiazepines 01/20/2022 NONE DETECTED  NONE DETECTED Final   Amphetamines 01/20/2022 NONE DETECTED  NONE DETECTED Final   Tetrahydrocannabinol 01/20/2022 NONE DETECTED  NONE DETECTED Final   Barbiturates 01/20/2022 NONE DETECTED  NONE DETECTED Final   Comment: (NOTE) DRUG SCREEN FOR MEDICAL PURPOSES ONLY.  IF CONFIRMATION IS NEEDED FOR ANY  PURPOSE, NOTIFY LAB WITHIN 5 DAYS.  LOWEST DETECTABLE LIMITS FOR URINE DRUG SCREEN Drug Class                     Cutoff (ng/mL) Amphetamine and metabolites    1000 Barbiturate and metabolites    200 Benzodiazepine                 200 Opiates and metabolites        300 Cocaine and metabolites        300 THC                            50 Performed at [facility], [place] [address], [place], [place] [address]    WBC 01/20/2022 5.2  4.0 - 10.5 K/uL Final   RBC 01/20/2022 4.73  4.22 - 5.81 MIL/uL Final   Hemoglobin 01/20/2022 14.3  13.0 - 17.0 g/dL Final   HCT 01/20/2022 43.1  39.0 - 52.0 % Final   MCV 01/20/2022 91.1  80.0 - 100.0 fL Final   MCH 01/20/2022 30.2  26.0 - 34.0 pg Final   MCHC 01/20/2022 33.2  30.0 - 36.0 g/dL Final   RDW 01/20/2022 14.1  11.5 - 15.5 % Final   Platelets 01/20/2022 444 (H)  150 - 400 K/uL Final   nRBC 01/20/2022 0.0  0.0 - 0.2 % Final   Neutrophils Relative % 01/20/2022 61  % Final   Neutro Abs 01/20/2022 3.2  1.7 - 7.7 K/uL Final   Lymphocytes Relative 01/20/2022 29  % Final   Lymphs Abs 01/20/2022 1.5  0.7 - 4.0 K/uL Final   Monocytes Relative 01/20/2022 7  % Final   Monocytes Absolute 01/20/2022 0.4  0.1 - 1.0 K/uL Final   Eosinophils Relative 01/20/2022 2  % Final   Eosinophils Absolute 01/20/2022 0.1  0.0 - 0.5 K/uL Final   Basophils Relative 01/20/2022 1  % Final   Basophils Absolute 01/20/2022 0.0  0.0 - 0.1 K/uL Final   Immature Granulocytes 01/20/2022 0  % Final   Abs Immature Granulocytes 01/20/2022 0.01  0.00 - 0.07 K/uL Final  Performed at [facility], [place] [address], [place], [place] [address]   Salicylate Lvl 27/25/3664 <7.0 (L)  7.0 - 30.0 mg/dL Final   Performed at [facility] [address], [place], [place] [address]   Acetaminophen (Tylenol), Serum 01/20/2022 <10 (L)  10 - 30 ug/mL Final   Comment: (NOTE) Therapeutic concentrations vary significantly. A range of 10-30 ug/mL  may be an effective concentration  for many patients. However, some  are best treated at concentrations outside of this range. Acetaminophen concentrations >150 ug/mL at 4 hours after ingestion  and >50 ug/mL at 12 hours after ingestion are often associated with  toxic reactions.  Performed at [facility], [place] [address], [place], [place] [address]    Glucose-Capillary 01/20/2022 344 (H)  70 - 99 mg/dL Final   Glucose reference range applies only to samples taken after fasting for at least 8 hours.   Comment 1 01/20/2022 Notify RN   Final   Comment 2 01/20/2022 Document in Chart   Final   Glucose-Capillary 01/20/2022 479 (H)  70 - 99 mg/dL Final   Glucose reference range applies only to samples taken after fasting for at least 8 hours.   Glucose-Capillary 01/21/2022 243 (H)  70 - 99 mg/dL Final   Glucose reference range applies only to samples taken after fasting for at least 8 hours.   Glucose-Capillary 01/21/2022 224 (H)  70 - 99 mg/dL Final   Glucose reference range applies only to samples taken after fasting for at least 8 hours.   Glucose-Capillary 01/21/2022 282 (H)  70 - 99 mg/dL Final   Glucose reference range applies only to samples taken after fasting for at least 8 hours.  Admission on 01/18/2022, Discharged on 01/19/2022  Component Date Value Ref Range Status   WBC 01/18/2022 5.3  4.0 - 10.5 K/uL Final   RBC 01/18/2022 4.47  4.22 - 5.81 MIL/uL Final   Hemoglobin 01/18/2022 13.7  13.0 - 17.0 g/dL Final   HCT 01/18/2022 41.0  39.0 - 52.0 % Final   MCV 01/18/2022 91.7  80.0 - 100.0 fL Final   MCH 01/18/2022 30.6  26.0 - 34.0 pg Final   MCHC 01/18/2022 33.4  30.0 - 36.0 g/dL Final   RDW 01/18/2022 14.2  11.5 - 15.5 % Final   Platelets 01/18/2022 445 (H)  150 - 400 K/uL Final   nRBC 01/18/2022 0.0  0.0 - 0.2 % Final   Performed at [facility], [place] [address] [place], [place] [address]   Sodium 01/18/2022 136  135 - 145 mmol/L Final   Potassium 01/18/2022 4.3  3.5 - 5.1 mmol/L Final   Chloride  01/18/2022 100  98 - 111 mmol/L Final   CO2 01/18/2022 22  22 - 32 mmol/L Final   Glucose, Bld 01/18/2022 499 (H)  70 - 99 mg/dL Final   Glucose reference range applies only to samples taken after fasting for at least 8 hours.   BUN 01/18/2022 9  8 - 23 mg/dL Final   Creatinine, Ser 01/18/2022 1.23  0.61 - 1.24 mg/dL Final   Calcium 01/18/2022 9.1  8.9 - 10.3 mg/dL Final   Total Protein 01/18/2022 6.6  6.5 - 8.1 g/dL Final   Albumin 01/18/2022 3.2 (L)  3.5 - 5.0 g/dL Final   AST 01/18/2022 18  15 - 41 U/L Final   ALT 01/18/2022 9  0 - 44 U/L Final   Alkaline Phosphatase 01/18/2022 95  38 -  126 U/L Final   Total Bilirubin 01/18/2022 0.3  0.3 - 1.2 mg/dL Final   GFR, Estimated 01/18/2022 >60  >60 mL/min Final   Comment: (NOTE) Calculated using the CKD-EPI Creatinine Equation (2021)    Anion gap 01/18/2022 14  5 - 15 Final   Performed at [facility], [place] [address] [place], [place] [address]   Color, Urine 01/18/2022 YELLOW  YELLOW Final   APPearance 01/18/2022 CLEAR  CLEAR Final   Specific Gravity, Urine 01/18/2022 1.025  1.005 - 1.030 Final   pH 01/18/2022 6.0  5.0 - 8.0 Final   Glucose, UA 01/18/2022 >=500 (A)  NEGATIVE mg/dL Final   Hgb urine dipstick 01/18/2022 NEGATIVE  NEGATIVE Final   Bilirubin Urine 01/18/2022 NEGATIVE  NEGATIVE Final   Ketones, ur 01/18/2022 NEGATIVE  NEGATIVE mg/dL Final   Protein, ur 01/18/2022 NEGATIVE  NEGATIVE mg/dL Final   Nitrite 01/18/2022 NEGATIVE  NEGATIVE Final   Leukocytes,Ua 01/18/2022 SMALL (A)  NEGATIVE Final   RBC / HPF 01/18/2022 0-5  0 - 5 RBC/hpf Final   WBC, UA 01/18/2022 21-50  0 - 5 WBC/hpf Final   Bacteria, UA 01/18/2022 RARE (A)  NONE SEEN Final   Squamous Epithelial / LPF 01/18/2022 0-5  0 - 5 Final   Budding Yeast 01/18/2022 PRESENT   Final   Performed at [facility], [place] [address], [place], [place] [address]   Opiates 01/18/2022 NONE DETECTED  NONE DETECTED Final   Cocaine 01/18/2022 POSITIVE (A)  NONE DETECTED  Final   Benzodiazepines 01/18/2022 NONE DETECTED  NONE DETECTED Final   Amphetamines 01/18/2022 NONE DETECTED  NONE DETECTED Final   Tetrahydrocannabinol 01/18/2022 NONE DETECTED  NONE DETECTED Final   Barbiturates 01/18/2022 NONE DETECTED  NONE DETECTED Final   Comment: (NOTE) DRUG SCREEN FOR MEDICAL PURPOSES ONLY.  IF CONFIRMATION IS NEEDED FOR ANY PURPOSE, NOTIFY LAB WITHIN 5 DAYS.  LOWEST DETECTABLE LIMITS FOR URINE DRUG SCREEN Drug Class                     Cutoff (ng/mL) Amphetamine and metabolites    1000 Barbiturate and metabolites    200 Benzodiazepine                 200 Opiates and metabolites        300 Cocaine and metabolites        300 THC                            50 Performed at [facility], [place] [address], [place], [place] [address]    Glucose-Capillary 01/18/2022 483 (H)  70 - 99 mg/dL Final   Glucose reference range applies only to samples taken after fasting for at least 8 hours.   Glucose-Capillary 01/19/2022 586 (HH)  70 - 99 mg/dL Final   Glucose reference range applies only to samples taken after fasting for at least 8 hours.   Glucose-Capillary 01/19/2022 456 (H)  70 - 99 mg/dL Final   Glucose reference range applies only to samples taken after fasting for at least 8 hours.   Glucose-Capillary 01/19/2022 515 (HH)  70 - 99 mg/dL Final   Glucose reference range applies only to samples taken after fasting for at least 8 hours.   Comment 1 01/19/2022 Notify RN   Final   Glucose-Capillary 01/19/2022 164 (H)  70 - 99 mg/dL Final   Glucose reference range applies only  to samples taken after fasting for at least 8 hours.  Admission on 01/07/2022, Discharged on 01/07/2022  Component Date Value Ref Range Status   Glucose-Capillary 01/07/2022 267 (H)  70 - 99 mg/dL Final   Glucose reference range applies only to samples taken after fasting for at least 8 hours.  Admission on 01/02/2022, Discharged on 01/02/2022  Component Date Value Ref Range Status   WBC  01/02/2022 7.0  4.0 - 10.5 K/uL Final   RBC 01/02/2022 4.54  4.22 - 5.81 MIL/uL Final   Hemoglobin 01/02/2022 13.9  13.0 - 17.0 g/dL Final   HCT 01/02/2022 41.4  39.0 - 52.0 % Final   MCV 01/02/2022 91.2  80.0 - 100.0 fL Final   MCH 01/02/2022 30.6  26.0 - 34.0 pg Final   MCHC 01/02/2022 33.6  30.0 - 36.0 g/dL Final   RDW 01/02/2022 14.1  11.5 - 15.5 % Final   Platelets 01/02/2022 369  150 - 400 K/uL Final   nRBC 01/02/2022 0.0  0.0 - 0.2 % Final   Neutrophils Relative % 01/02/2022 59  % Final   Neutro Abs 01/02/2022 4.1  1.7 - 7.7 K/uL Final   Lymphocytes Relative 01/02/2022 28  % Final   Lymphs Abs 01/02/2022 1.9  0.7 - 4.0 K/uL Final   Monocytes Relative 01/02/2022 8  % Final   Monocytes Absolute 01/02/2022 0.5  0.1 - 1.0 K/uL Final   Eosinophils Relative 01/02/2022 4  % Final   Eosinophils Absolute 01/02/2022 0.3  0.0 - 0.5 K/uL Final   Basophils Relative 01/02/2022 1  % Final   Basophils Absolute 01/02/2022 0.1  0.0 - 0.1 K/uL Final   Immature Granulocytes 01/02/2022 0  % Final   Abs Immature Granulocytes 01/02/2022 0.02  0.00 - 0.07 K/uL Final   Performed at [facility], [place] [address] [place], [place] [address]   Sodium 01/02/2022 138  135 - 145 mmol/L Final   Potassium 01/02/2022 4.1  3.5 - 5.1 mmol/L Final   Chloride 01/02/2022 104  98 - 111 mmol/L Final   CO2 01/02/2022 25  22 - 32 mmol/L Final   Glucose, Bld 01/02/2022 216 (H)  70 - 99 mg/dL Final   Glucose reference range applies only to samples taken after fasting for at least 8 hours.   BUN 01/02/2022 11  8 - 23 mg/dL Final   Creatinine, Ser 01/02/2022 0.98  0.61 - 1.24 mg/dL Final   Calcium 01/02/2022 9.5  8.9 - 10.3 mg/dL Final   Total Protein 01/02/2022 7.4  6.5 - 8.1 g/dL Final   Albumin 01/02/2022 3.4 (L)  3.5 - 5.0 g/dL Final   AST 01/02/2022 12 (L)  15 - 41 U/L Final   ALT 01/02/2022 11  0 - 44 U/L Final   Alkaline Phosphatase 01/02/2022 90  38 - 126 U/L Final   Total Bilirubin 01/02/2022 0.9  0.3 -  1.2 mg/dL Final   GFR, Estimated 01/02/2022 >60  >60 mL/min Final   Comment: (NOTE) Calculated using the CKD-EPI Creatinine Equation (2021)    Anion gap 01/02/2022 9  5 - 15 Final   Performed at [facility] [address] [place], [place] [address]  [facility] on 11/01/2021  Component Date Value Ref Range Status   POC Glucose 11/27/2021 301 (A)  70 - 99 mg/dl Final   1 hr after lunch, had not taken insulin  Admission on 10/13/2021, Discharged on 10/15/2021  Component Date Value Ref Range Status  Glucose-Capillary 10/13/2021 388 (H)  70 - 99 mg/dL Final   Glucose reference range applies only to samples taken after fasting for at least 8 hours.   SARS Coronavirus 2 by RT PCR 10/13/2021 NEGATIVE  NEGATIVE Final   Comment: (NOTE) SARS-CoV-2 target nucleic acids are NOT DETECTED.  The SARS-CoV-2 RNA is generally detectable in upper respiratory specimens during the acute phase of infection. The lowest concentration of SARS-CoV-2 viral copies this assay can detect is 138 copies/mL. A negative result does not preclude SARS-Cov-2 infection and should not be used as the sole basis for treatment or other patient management decisions. A negative result may occur with  improper specimen collection/handling, submission of specimen other than nasopharyngeal swab, presence of viral mutation(s) within the areas targeted by this assay, and inadequate number of viral copies(<138 copies/mL). A negative result must be combined with clinical observations, patient history, and epidemiological information. The expected result is Negative.  Fact Sheet for Patients:  EntrepreneurPulse.com.au  Fact Sheet for Healthcare Providers:  IncredibleEmployment.be  This test is no                          t yet approved or cleared by the [place] FDA and  has been authorized for detection and/or diagnosis of SARS-CoV-2 by FDA under an Emergency  Use Authorization (EUA). This EUA will remain  in effect (meaning this test can be used) for the duration of the COVID-19 declaration under Section 564(b)(1) of the Act, 21 U.S.C.section 360bbb-3(b)(1), unless the authorization is terminated  or revoked sooner.       Influenza A by PCR 10/13/2021 NEGATIVE  NEGATIVE Final   Influenza B by PCR 10/13/2021 NEGATIVE  NEGATIVE Final   Comment: (NOTE) The Xpert Xpress SARS-CoV-2/FLU/RSV plus assay is intended as an aid in the diagnosis of influenza from Nasopharyngeal swab specimens and should not be used as a sole basis for treatment. Nasal washings and aspirates are unacceptable for Xpert Xpress SARS-CoV-2/FLU/RSV testing.  Fact Sheet for Patients: EntrepreneurPulse.com.au  Fact Sheet for Healthcare Providers: IncredibleEmployment.be  This test is not yet approved or cleared by the [place] FDA and has been authorized for detection and/or diagnosis of SARS-CoV-2 by FDA under an Emergency Use Authorization (EUA). This EUA will remain in effect (meaning this test can be used) for the duration of the COVID-19 declaration under Section 564(b)(1) of the Act, 21 U.S.C. section 360bbb-3(b)(1), unless the authorization is terminated or revoked.  Performed at [facility], [place] [address], [place], [place] [address]    WBC 10/13/2021 9.5  4.0 - 10.5 K/uL Final   RBC 10/13/2021 4.23  4.22 - 5.81 MIL/uL Final   Hemoglobin 10/13/2021 13.0  13.0 - 17.0 g/dL Final   HCT 10/13/2021 40.4  39.0 - 52.0 % Final   MCV 10/13/2021 95.5  80.0 - 100.0 fL Final   MCH 10/13/2021 30.7  26.0 - 34.0 pg Final   MCHC 10/13/2021 32.2  30.0 - 36.0 g/dL Final   RDW 10/13/2021 13.4  11.5 - 15.5 % Final   Platelets 10/13/2021 551 (H)  150 - 400 K/uL Final   nRBC 10/13/2021 0.0  0.0 - 0.2 % Final   Neutrophils Relative % 10/13/2021 68  % Final   Neutro Abs 10/13/2021 6.5  1.7 - 7.7 K/uL Final   Lymphocytes Relative  10/13/2021 18  % Final   Lymphs Abs 10/13/2021 1.7  0.7 - 4.0 K/uL Final   Monocytes Relative  10/13/2021 9  % Final   Monocytes Absolute 10/13/2021 0.8  0.1 - 1.0 K/uL Final   Eosinophils Relative 10/13/2021 3  % Final   Eosinophils Absolute 10/13/2021 0.3  0.0 - 0.5 K/uL Final   Basophils Relative 10/13/2021 1  % Final   Basophils Absolute 10/13/2021 0.1  0.0 - 0.1 K/uL Final   Immature Granulocytes 10/13/2021 1  % Final   Abs Immature Granulocytes 10/13/2021 0.11 (H)  0.00 - 0.07 K/uL Final   Performed at [facility], [place] [address] [place], [place] [address]   Sodium 10/13/2021 136  135 - 145 mmol/L Final   Potassium 10/13/2021 4.3  3.5 - 5.1 mmol/L Final   Chloride 10/13/2021 104  98 - 111 mmol/L Final   CO2 10/13/2021 24  22 - 32 mmol/L Final   Glucose, Bld 10/13/2021 296 (H)  70 - 99 mg/dL Final   Glucose reference range applies only to samples taken after fasting for at least 8 hours.   BUN 10/13/2021 14  8 - 23 mg/dL Final   Creatinine, Ser 10/13/2021 1.15  0.61 - 1.24 mg/dL Final   Calcium 10/13/2021 9.0  8.9 - 10.3 mg/dL Final   Total Protein 10/13/2021 7.5  6.5 - 8.1 g/dL Final   Albumin 10/13/2021 3.0 (L)  3.5 - 5.0 g/dL Final   AST 10/13/2021 13 (L)  15 - 41 U/L Final   ALT 10/13/2021 10  0 - 44 U/L Final   Alkaline Phosphatase 10/13/2021 84  38 - 126 U/L Final   Total Bilirubin 10/13/2021 0.5  0.3 - 1.2 mg/dL Final   GFR, Estimated 10/13/2021 >60  >60 mL/min Final   Comment: (NOTE) Calculated using the CKD-EPI Creatinine Equation (2021)    Anion gap 10/13/2021 8  5 - 15 Final   Performed at [facility], [place] [address], [place], [place] [address]   Color, Urine 10/13/2021 YELLOW  YELLOW Final   APPearance 10/13/2021 CLOUDY (A)  CLEAR Final   Specific Gravity, Urine 10/13/2021 1.022  1.005 - 1.030 Final   pH 10/13/2021 5.0  5.0 - 8.0 Final   Glucose, UA 10/13/2021 >=500 (A)  NEGATIVE mg/dL Final   Hgb urine dipstick 10/13/2021 LARGE (A)  NEGATIVE Final    Bilirubin Urine 10/13/2021 NEGATIVE  NEGATIVE Final   Ketones, ur 10/13/2021 NEGATIVE  NEGATIVE mg/dL Final   Protein, ur 10/13/2021 30 (A)  NEGATIVE mg/dL Final   Nitrite 10/13/2021 NEGATIVE  NEGATIVE Final   Leukocytes,Ua 10/13/2021 LARGE (A)  NEGATIVE Final   RBC / HPF 10/13/2021 >50 (H)  0 - 5 RBC/hpf Final   WBC, UA 10/13/2021 >50 (H)  0 - 5 WBC/hpf Final   Bacteria, UA 10/13/2021 RARE (A)  NONE SEEN Final   Squamous Epithelial / LPF 10/13/2021 0-5  0 - 5 Final   WBC Clumps 10/13/2021 PRESENT   Final   Mucus 10/13/2021 PRESENT   Final   Budding Yeast 10/13/2021 PRESENT   Final   Non Squamous Epithelial 10/13/2021 0-5 (A)  NONE SEEN Final   Performed at [facility], [place] [address] [place], [place] [address]   pH, Ven 10/13/2021 7.457 (H)  7.25 - 7.43 Final   pCO2, Ven 10/13/2021 38.1 (L)  44 - 60 mmHg Final   pO2, Ven 10/13/2021 174 (H)  32 - 45 mmHg Final   Bicarbonate 10/13/2021 26.9  20.0 - 28.0 mmol/L Final   TCO2 10/13/2021 28  22 - 32 mmol/L Final  O2 Saturation 10/13/2021 100  % Final   Acid-Base Excess 10/13/2021 3.0 (H)  0.0 - 2.0 mmol/L Final   Sodium 10/13/2021 137  135 - 145 mmol/L Final   Potassium 10/13/2021 4.3  3.5 - 5.1 mmol/L Final   Calcium, Ion 10/13/2021 1.10 (L)  1.15 - 1.40 mmol/L Final   HCT 10/13/2021 40.0  39.0 - 52.0 % Final   Hemoglobin 10/13/2021 13.6  13.0 - 17.0 g/dL Final   Sample type 10/13/2021 VENOUS   Final   Opiates 10/13/2021 NONE DETECTED  NONE DETECTED Final   Cocaine 10/13/2021 POSITIVE (A)  NONE DETECTED Final   Benzodiazepines 10/13/2021 NONE DETECTED  NONE DETECTED Final   Amphetamines 10/13/2021 NONE DETECTED  NONE DETECTED Final   Tetrahydrocannabinol 10/13/2021 NONE DETECTED  NONE DETECTED Final   Barbiturates 10/13/2021 NONE DETECTED  NONE DETECTED Final   Comment: (NOTE) DRUG SCREEN FOR MEDICAL PURPOSES ONLY.  IF CONFIRMATION IS NEEDED FOR ANY PURPOSE, NOTIFY LAB WITHIN 5 DAYS.  LOWEST DETECTABLE LIMITS FOR URINE  DRUG SCREEN Drug Class                     Cutoff (ng/mL) Amphetamine and metabolites    1000 Barbiturate and metabolites    200 Benzodiazepine                 574 Tricyclics and metabolites     300 Opiates and metabolites        300 Cocaine and metabolites        300 THC                            50 Performed at [facility], [place] [address], [place], [place] [address]    Adenovirus 10/13/2021 NOT DETECTED  NOT DETECTED Final   Coronavirus 229E 10/13/2021 NOT DETECTED  NOT DETECTED Final   Comment: (NOTE) The Coronavirus on the Respiratory Panel, DOES NOT test for the novel  Coronavirus (2019 nCoV)    Coronavirus HKU1 10/13/2021 NOT DETECTED  NOT DETECTED Final   Coronavirus NL63 10/13/2021 NOT DETECTED  NOT DETECTED Final   Coronavirus OC43 10/13/2021 NOT DETECTED  NOT DETECTED Final   Metapneumovirus 10/13/2021 NOT DETECTED  NOT DETECTED Final   Rhinovirus / Enterovirus 10/13/2021 NOT DETECTED  NOT DETECTED Final   Influenza A 10/13/2021 NOT DETECTED  NOT DETECTED Final   Influenza B 10/13/2021 NOT DETECTED  NOT DETECTED Final   Parainfluenza Virus 1 10/13/2021 NOT DETECTED  NOT DETECTED Final   Parainfluenza Virus 2 10/13/2021 NOT DETECTED  NOT DETECTED Final   Parainfluenza Virus 3 10/13/2021 NOT DETECTED  NOT DETECTED Final   Parainfluenza Virus 4 10/13/2021 NOT DETECTED  NOT DETECTED Final   Respiratory Syncytial Virus 10/13/2021 NOT DETECTED  NOT DETECTED Final   Bordetella pertussis 10/13/2021 NOT DETECTED  NOT DETECTED Final   Bordetella Parapertussis 10/13/2021 NOT DETECTED  NOT DETECTED Final   Chlamydophila pneumoniae 10/13/2021 NOT DETECTED  NOT DETECTED Final   Mycoplasma pneumoniae 10/13/2021 NOT DETECTED  NOT DETECTED Final   Performed at [facility], [place] [address], [place], [place] [address]   Procalcitonin 10/13/2021 <0.10  ng/mL Final   Comment:        Interpretation: PCT (Procalcitonin) <= 0.5 ng/mL: Systemic infection (sepsis) is not  likely. Local bacterial infection is possible. (NOTE)       Sepsis PCT Algorithm  Lower Respiratory Tract                                      Infection PCT Algorithm    ----------------------------     ----------------------------         PCT < 0.25 ng/mL                PCT < 0.10 ng/mL          Strongly encourage             Strongly discourage   discontinuation of antibiotics    initiation of antibiotics    ----------------------------     -----------------------------       PCT 0.25 - 0.50 ng/mL            PCT 0.10 - 0.25 ng/mL               OR       >80% decrease in PCT            Discourage initiation of                                            antibiotics      Encourage discontinuation           of antibiotics    ----------------------------     -----------------------------         PCT >= 0.50 ng/mL              PCT 0.26 - 0.50 ng/mL               AND                                 <80% decrease in PCT             Encourage initiation of                                             antibiotics       Encourage continuation           of antibiotics    ----------------------------     -----------------------------        PCT >= 0.50 ng/mL                  PCT > 0.50 ng/mL               AND         increase in PCT                  Strongly encourage                                      initiation of antibiotics    Strongly encourage escalation           of antibiotics                                     -----------------------------  PCT <= 0.25 ng/mL                                                 OR                                        > 80% decrease in PCT                                      Discontinue / Do not initiate                                             antibiotics  Performed at [facility], [place] [address] [place], [place] [address]    B Natriuretic Peptide 10/13/2021 23.6  0.0 - 100.0 pg/mL  Final   Performed at [facility] [address] [place], [place] [address]   L. pneumophila Serogp 1 Ur Ag 10/13/2021 Negative  Negative Final   Comment: (NOTE) Presumptive negative for L. pneumophila serogroup 1 antigen in urine, suggesting no recent or current infection. Legionnaires' disease cannot be ruled out since other serogroups and species may also cause disease. Performed At: [facility] [place], [place] [id number] Rush Farmer MD TD:[id number]    Source of Sample 10/13/2021 URINE, RANDOM   Final   Performed at [facility], [place] [address] [place], [place] [address]   Strep Pneumo Urinary Antigen 10/13/2021 NEGATIVE  NEGATIVE Final   Comment:        Infection due to S. pneumoniae cannot be absolutely ruled out since the antigen present may be below the detection limit of the test. Performed at [facility], [address], [place], [place] [address]    Sodium 10/14/2021 138  135 - 145 mmol/L Final   Potassium 10/14/2021 4.3  3.5 - 5.1 mmol/L Final   Chloride 10/14/2021 106  98 - 111 mmol/L Final   CO2 10/14/2021 25  22 - 32 mmol/L Final   Glucose, Bld 10/14/2021 286 (H)  70 - 99 mg/dL Final   Glucose reference range applies only to samples taken after fasting for at least 8 hours.   BUN 10/14/2021 14  8 - 23 mg/dL Final   Creatinine, Ser 10/14/2021 0.98  0.61 - 1.24 mg/dL Final   Calcium 10/14/2021 8.9  8.9 - 10.3 mg/dL Final   GFR, Estimated 10/14/2021 >60  >60 mL/min Final   Comment: (NOTE) Calculated using the CKD-EPI Creatinine Equation (2021)    Anion gap 10/14/2021 7  5 - 15 Final   Performed at [facility], [place], [place] [address] [place], [place] [address]   WBC 10/14/2021 9.6  4.0 - 10.5 K/uL Final   RBC 10/14/2021 4.12 (L)  4.22 - 5.81 MIL/uL Final   Hemoglobin 10/14/2021 12.6 (L)  13.0 - 17.0 g/dL Final   HCT 10/14/2021 38.8 (L)  39.0 - 52.0 % Final   MCV 10/14/2021 94.2  80.0 - 100.0 fL Final   MCH  10/14/2021 30.6  26.0 - 34.0  pg Final   MCHC 10/14/2021 32.5  30.0 - 36.0 g/dL Final   RDW 10/14/2021 13.4  11.5 - 15.5 % Final   Platelets 10/14/2021 556 (H)  150 - 400 K/uL Final   nRBC 10/14/2021 0.0  0.0 - 0.2 % Final   Performed at [facility], [place] [address], [place], [place] [address]   Glucose-Capillary 10/13/2021 281 (H)  70 - 99 mg/dL Final   Glucose reference range applies only to samples taken after fasting for at least 8 hours.   Glucose-Capillary 10/13/2021 399 (H)  70 - 99 mg/dL Final   Glucose reference range applies only to samples taken after fasting for at least 8 hours.   Glucose-Capillary 10/14/2021 239 (H)  70 - 99 mg/dL Final   Glucose reference range applies only to samples taken after fasting for at least 8 hours.   Glucose-Capillary 10/14/2021 345 (H)  70 - 99 mg/dL Final   Glucose reference range applies only to samples taken after fasting for at least 8 hours.   Glucose-Capillary 10/14/2021 383 (H)  70 - 99 mg/dL Final   Glucose reference range applies only to samples taken after fasting for at least 8 hours.   Glucose-Capillary 10/15/2021 194 (H)  70 - 99 mg/dL Final   Glucose reference range applies only to samples taken after fasting for at least 8 hours.   Glucose-Capillary 10/15/2021 161 (H)  70 - 99 mg/dL Final   Glucose reference range applies only to samples taken after fasting for at least 8 hours.   Glucose-Capillary 10/15/2021 312 (H)  70 - 99 mg/dL Final   Glucose reference range applies only to samples taken after fasting for at least 8 hours.   Glucose-Capillary 10/14/2021 279 (H)  70 - 99 mg/dL Final   Glucose reference range applies only to samples taken after fasting for at least 8 hours.  Office Visit on 10/08/2021  Component Date Value Ref Range Status   POC Glucose 10/08/2021 321 (A)  70 - 99 mg/dl Final   HbA1c, POC (controlled diabetic ra* 10/08/2021 9.3 (A)  0.0 - 7.0 % Final  Admission on 10/07/2021, Discharged on 10/07/2021   Component Date Value Ref Range Status   Color, Urine 10/07/2021 YELLOW  YELLOW Final   APPearance 10/07/2021 HAZY (A)  CLEAR Final   Specific Gravity, Urine 10/07/2021 1.010  1.005 - 1.030 Final   pH 10/07/2021 5.0  5.0 - 8.0 Final   Glucose, UA 10/07/2021 >=500 (A)  NEGATIVE mg/dL Final   Hgb urine dipstick 10/07/2021 MODERATE (A)  NEGATIVE Final   Bilirubin Urine 10/07/2021 NEGATIVE  NEGATIVE Final   Ketones, ur 10/07/2021 NEGATIVE  NEGATIVE mg/dL Final   Protein, ur 10/07/2021 30 (A)  NEGATIVE mg/dL Final   Nitrite 10/07/2021 NEGATIVE  NEGATIVE Final   Leukocytes,Ua 10/07/2021 LARGE (A)  NEGATIVE Final   RBC / HPF 10/07/2021 21-50  0 - 5 RBC/hpf Final   WBC, UA 10/07/2021 >50 (H)  0 - 5 WBC/hpf Final   Bacteria, UA 10/07/2021 NONE SEEN  NONE SEEN Final   WBC Clumps 10/07/2021 PRESENT   Final   Mucus 10/07/2021 PRESENT   Final   Budding Yeast 10/07/2021 PRESENT   Final   Performed at [facility], [place] [address], [place], [place] [address]   WBC 10/07/2021 8.9  4.0 - 10.5 K/uL Final   RBC 10/07/2021 4.35  4.22 - 5.81 MIL/uL Final   Hemoglobin 10/07/2021 13.7  13.0 - 17.0 g/dL Final  HCT 10/07/2021 41.9  39.0 - 52.0 % Final   MCV 10/07/2021 96.3  80.0 - 100.0 fL Final   MCH 10/07/2021 31.5  26.0 - 34.0 pg Final   MCHC 10/07/2021 32.7  30.0 - 36.0 g/dL Final   RDW 10/07/2021 13.4  11.5 - 15.5 % Final   Platelets 10/07/2021 376  150 - 400 K/uL Final   nRBC 10/07/2021 0.0  0.0 - 0.2 % Final   Neutrophils Relative % 10/07/2021 59  % Final   Neutro Abs 10/07/2021 5.2  1.7 - 7.7 K/uL Final   Lymphocytes Relative 10/07/2021 17  % Final   Lymphs Abs 10/07/2021 1.5  0.7 - 4.0 K/uL Final   Monocytes Relative 10/07/2021 19  % Final   Monocytes Absolute 10/07/2021 1.7 (H)  0.1 - 1.0 K/uL Final   Eosinophils Relative 10/07/2021 5  % Final   Eosinophils Absolute 10/07/2021 0.5  0.0 - 0.5 K/uL Final   Basophils Relative 10/07/2021 0  % Final   Basophils Absolute  10/07/2021 0.0  0.0 - 0.1 K/uL Final   Immature Granulocytes 10/07/2021 0  % Final   Abs Immature Granulocytes 10/07/2021 0.04  0.00 - 0.07 K/uL Final   Performed at [facility], [place] [address], [place], [place] [address]   Sodium 10/07/2021 141  135 - 145 mmol/L Final   Potassium 10/07/2021 4.0  3.5 - 5.1 mmol/L Final   Chloride 10/07/2021 107  98 - 111 mmol/L Final   CO2 10/07/2021 24  22 - 32 mmol/L Final   Glucose, Bld 10/07/2021 81  70 - 99 mg/dL Final   Glucose reference range applies only to samples taken after fasting for at least 8 hours.   BUN 10/07/2021 14  8 - 23 mg/dL Final   Creatinine, Ser 10/07/2021 1.19  0.61 - 1.24 mg/dL Final   Calcium 10/07/2021 9.4  8.9 - 10.3 mg/dL Final   Total Protein 10/07/2021 7.9  6.5 - 8.1 g/dL Final   Albumin 10/07/2021 3.5  3.5 - 5.0 g/dL Final   AST 10/07/2021 12 (L)  15 - 41 U/L Final   ALT 10/07/2021 10  0 - 44 U/L Final   Alkaline Phosphatase 10/07/2021 73  38 - 126 U/L Final   Total Bilirubin 10/07/2021 0.7  0.3 - 1.2 mg/dL Final   GFR, Estimated 10/07/2021 >60  >60 mL/min Final   Comment: (NOTE) Calculated using the CKD-EPI Creatinine Equation (2021)    Anion gap 10/07/2021 10  5 - 15 Final   Performed at [facility], [place] [address], [place], [place] [address]   Specimen Description 10/07/2021    Final                   Value:URINE, CLEAN CATCH Performed at [facility], [place] [address], [place], [place] [address]    Special Requests 10/07/2021    Final                   Value:NONE Performed at [facility], [place] [address] [place], [place] [address]    Culture 10/07/2021  (A)   Final                   Value:40,000 COLONIES/mL YEAST 20,000 COLONIES/mL LACTOBACILLUS SPECIES Standardized susceptibility testing for this organism is not available. Performed at [facility], [place] [address] [place], [place] [address]    Report Status 10/07/2021  10/08/2021 FINAL   Final  Admission on 10/04/2021, Discharged on 10/04/2021  Component Date Value Ref Range Status   WBC 10/04/2021 9.3  4.0 - 10.5 K/uL Final   RBC 10/04/2021 4.26  4.22 - 5.81 MIL/uL Final   Hemoglobin 10/04/2021 13.4  13.0 - 17.0 g/dL Final   HCT 10/04/2021 41.5  39.0 - 52.0 % Final   MCV 10/04/2021 97.4  80.0 - 100.0 fL Final   MCH 10/04/2021 31.5  26.0 - 34.0 pg Final   MCHC 10/04/2021 32.3  30.0 - 36.0 g/dL Final   RDW 10/04/2021 13.6  11.5 - 15.5 % Final   Platelets 10/04/2021 367  150 - 400 K/uL Final   nRBC 10/04/2021 0.0  0.0 - 0.2 % Final   Performed at [facility], [place] [address], [place], [place] [address]   Sodium 10/04/2021 138  135 - 145 mmol/L Final   Potassium 10/04/2021 4.4  3.5 - 5.1 mmol/L Final   Chloride 10/04/2021 107  98 - 111 mmol/L Final   CO2 10/04/2021 22  22 - 32 mmol/L Final   Glucose, Bld 10/04/2021 236 (H)  70 - 99 mg/dL Final   Glucose reference range applies only to samples taken after fasting for at least 8 hours.   BUN 10/04/2021 13  8 - 23 mg/dL Final   Creatinine, Ser 10/04/2021 1.15  0.61 - 1.24 mg/dL Final   Calcium 10/04/2021 8.9  8.9 - 10.3 mg/dL Final   GFR, Estimated 10/04/2021 >60  >60 mL/min Final   Comment: (NOTE) Calculated using the CKD-EPI Creatinine Equation (2021)    Anion gap 10/04/2021 9  5 - 15 Final   Performed at [facility], [place] [address], [place], [place] [address]  There may be more visits with results that are not included.    Blood Alcohol level:  Lab Results  Component Value Date   ETH <10 01/20/2022   ETH <10 89/37/3428    Metabolic Disorder Labs: Lab Results  Component Value Date   HGBA1C 10.4 (H) 01/21/2022   MPG 251.78 01/21/2022   MPG >398 02/24/2021   No results found for: "PROLACTIN" Lab Results  Component Value Date   CHOL 213 (H) 05/23/2021   TRIG 115 05/23/2021   HDL 71 05/23/2021   CHOLHDL 3.2 11/03/2019   VLDL 32 (H) 05/03/2015    LDLCALC 122 (H) 05/23/2021   LDLCALC 99 11/03/2019    Therapeutic Lab Levels: No results found for: "LITHIUM" No results found for: "VALPROATE" No results found for: "CBMZ"  Physical Findings   GAD-7    Flowsheet Row Office Visit from 10/08/2021 in [place] Office Visit from 09/05/2021 in [place] [facility] Office Visit from 12/26/2020 in [place] Office Visit from 11/17/2018 in [place] Office Visit from 06/05/2017 in [place]  Total GAD-7 Score _0 0 12      PHQ2-9    [place] ED from 01/21/2022 in [facility] Office Visit from 10/08/2021 in [place] Office Visit from 09/05/2021 in [facility] [facility] ED from 07/02/2021 in [facility] Office Visit from 12/26/2020 in [place]  PHQ-2 Total Score _1 PHQ-9 Total Score -- _2 Flowsheet Row ED from 01/21/2022 in [facility] ED from 01/20/2022  in [facility] ED from 01/18/2022 in [place] CATEGORY Low Risk Low Risk No Risk        Musculoskeletal  Strength & Muscle Tone: decreased Gait & Station: unsteady Patient leans: N/A  Psychiatric Specialty Exam  Presentation  General Appearance:  Appropriate for Environment; Casual  Eye Contact: Good  Speech: Clear and Coherent; Normal Rate  Speech Volume: Normal  Handedness: Right   Mood and Affect  Mood: Euthymic  Affect: Appropriate; Congruent   Thought Process  Thought Processes: Coherent; Goal Directed  Descriptions of Associations:Intact  Orientation:Full (Time, Place and Person)  Thought Content:Logical; WDL  Diagnosis of Schizophrenia or Schizoaffective  disorder in past: No    Hallucinations:Hallucinations: None  Ideas of Reference:None  Suicidal Thoughts:Suicidal Thoughts: No  Homicidal Thoughts:Homicidal Thoughts: No   Sensorium  Memory: Immediate Fair; Recent Fair  Judgment: Good  Insight: Good   Executive Functions  Concentration: Good  Attention Span: Good  Recall: Good  Fund of Knowledge: Good  Language: Good   Psychomotor Activity  Psychomotor Activity: Psychomotor Activity: Shuffling Gait   Assets  Assets: Communication Skills; Desire for Improvement; Resilience   Sleep  Sleep: Sleep: Good   No data recorded  Physical Exam  Physical Exam Vitals and nursing note reviewed.  Constitutional:      General: He is not in acute distress.    Appearance: He is normal weight. He is not ill-appearing or toxic-appearing.  HENT:     Head: Normocephalic and atraumatic.  Pulmonary:     Effort: Pulmonary effort is normal.  Neurological:     General: No focal deficit present.     Mental Status: He is alert.    Review of Systems  Respiratory:  Negative for cough and shortness of breath.   Cardiovascular:  Negative for chest pain.  Gastrointestinal:  Negative for abdominal pain, constipation, diarrhea, nausea and vomiting.  Neurological:  Negative for dizziness, weakness and headaches.  Psychiatric/Behavioral:  Negative for depression, hallucinations and suicidal ideas. The patient is not nervous/anxious.    Blood pressure (!) 137/92, pulse 84, temperature 97.9 F (36.6 C), temperature source Oral, resp. rate 18, SpO2 98 %. There is no height or weight on file to calculate BMI.  Treatment Plan Summary: Daily contact with patient to assess and evaluate symptoms and progress in treatment and Medication management  Ahmar Pickrell is a 61 y.o. male, with PMH cocaine use disorder, tobacco use disorder, housing instability, DM2 + neuropathy, HTN, HLD, no inpatient psych admission, no suicide attempts,  who initially presented to [facility] (01/20/2022) for passive SI and assistance with rehab for crack cocaine use. Patient was then IVC by EDP, which was rescinded on 01/21/2022 by Mayra Neer, MD.  He was then admitted [facility] to [facility] (01/21/2022) via EMS for assistance with residential placement.       Dashon has tolerated detox well and is not having any withdrawal symptoms.  His blood sugars have been improving with him watching his diet more.  We will not make any changes to his medications at this time.  He is looking forward to residential rehab and is future oriented in the next steps after that.  We will continue to monitor.   Cocaine use d/o  Tobacco use d/o  Action stage. Encouraged cessation Comfort PRNs     Substance induced mood d/o Continued home Cymbalta 60 mg daily - Continue Trazodone 195m QHS PRN     Chronic Pain -  Continued gabapentin 665m TID - Continued PRN Tylenol    DM2-insulin-dependent with neuropathy, IMPROVING Urge Incontinence  BPH A1c 10.4 (01/21/2022), from 9.7 earlier this year. Goal CBGs <180. CBGs mostly <180 with no hypoglycemic events.  After educating on uncontrolled DM2, patient continues to work diligently on diet, self administering insulin and foot exam. In regards to urge incontinence patient has been able to make it to bathroom every time during this stay. Urge incontinence has been improving with better glycemic control.  Continued home metformin 1000 mg twice daily Continued home glipizide 10 mg with breakfast Continued home empagliflozin 10 mg with breakfast Continued home long acting insulin, self administered - 24 units daily  Continued sensitive sliding scale  Continued short acting 3 units BID for hypoglycemia per above (home dose was 5 units BID) Continued Gabapentin 600 mg TID and Cymbalta per above for neuropathy Educated on self foot exam twice daily Increased home tamsulosin 0.451mdaily to 0.27m39maily Glucometer , lancets, and  [facility] Hx of GER[facility] Improving, still hungry Continue pepcid 62m60mD   Considerations for follow-up: Recommend high risk screening every 6-12 months with HIV, RPR, hepatitis panel PCP for insulin dependent DM2 - uncontrolled [facility]    DISPO: Tentative date: 11/27 [place]  [facility] or [employer] to needles required for insulin dependency   AlexBriant Cedar 02/02/2022 2:03 PM

## 2022-02-02 NOTE — ED Notes (Signed)
Pt is in the dayroom with peers. Pt denies SI/HI/AVH. No acute distress noted. Will continue to monitor for safety.

## 2022-02-02 NOTE — ED Notes (Signed)
Patient in dayroom watching TV. Respirations equal and unlabored, skin warm and dry, NAD. No change in assessment or acuity. Q 15 minute safety checks remain in place.   

## 2022-02-02 NOTE — ED Notes (Signed)
Breakfast was given

## 2022-02-02 NOTE — ED Notes (Signed)
Patient received evening medication. Patient is now in dayroom eating dinner.Patient voiced concerns over pasta elevating his blood sugar. Patient request alternative food. Chicken salad sandwich given to patient. Patient semed pleased with substitution. Respirations equal and unlabored, skin warm and dry, NAD. No change in assessment or acuity. Q 15 minute safety checks remain in place.

## 2022-02-02 NOTE — ED Notes (Signed)
Pt up to bathroom and requesting for his blood sugar to be checked, states he feels like it is low. CBG 112. Leandro Reasoner, NP made aware. Pt back to bed. No signs of acute distress noted. Monitoring for safety.

## 2022-02-02 NOTE — ED Notes (Signed)
Patient observed/assessed in bed/chair resting quietly appearing with no distress and verbalizing no complaints at this time. Will continue to monitor.

## 2022-02-02 NOTE — ED Notes (Signed)
Pt is sleeping. No distress noted. Will continue to monitor safety. 

## 2022-02-02 NOTE — ED Notes (Signed)
Lunch was given

## 2022-02-02 NOTE — ED Notes (Signed)
Patient told me that he did not want his medicine because it is taking too long. The he comes out of his room, into the cafeteria and takes another patients chocolate muffin. I proceeded to tell him that it was wrong and he is not supposed to be eating that because he is a diabetic. He walked out and apologized and stated he was getting out of character and if he would have got his med's already and went to sleep it wouldn't have happened.

## 2022-02-03 DIAGNOSIS — F1420 Cocaine dependence, uncomplicated: Secondary | ICD-10-CM | POA: Diagnosis not present

## 2022-02-03 DIAGNOSIS — E785 Hyperlipidemia, unspecified: Secondary | ICD-10-CM | POA: Diagnosis not present

## 2022-02-03 DIAGNOSIS — I10 Essential (primary) hypertension: Secondary | ICD-10-CM | POA: Diagnosis not present

## 2022-02-03 DIAGNOSIS — Z720 Tobacco use: Secondary | ICD-10-CM | POA: Diagnosis not present

## 2022-02-03 LAB — GLUCOSE, CAPILLARY
Glucose-Capillary: 113 mg/dL — ABNORMAL HIGH (ref 70–99)
Glucose-Capillary: 158 mg/dL — ABNORMAL HIGH (ref 70–99)
Glucose-Capillary: 100 mg/dL — ABNORMAL HIGH (ref 70–99)
Glucose-Capillary: 162 mg/dL — ABNORMAL HIGH (ref 70–99)
Glucose-Capillary: 114 mg/dL — ABNORMAL HIGH (ref 70–99)

## 2022-02-03 MED ORDER — TRAZODONE HCL 100 MG PO TABS: 100.0000 mg | ORAL_TABLET | Freq: Every evening | ORAL | 0 refills | Status: DC | PRN

## 2022-02-03 MED ORDER — NICOTINE 21 MG/24HR TD PT24: 21.0000 mg | MEDICATED_PATCH | Freq: Every day | TRANSDERMAL | 0 refills | Status: DC

## 2022-02-03 NOTE — ED Notes (Signed)
Patient observed/assessed in room in bed appearing in no immediate distress resting peacefully. Q15 minute checks continued by MHT and nursing staff. Will continue to monitor and support. 

## 2022-02-03 NOTE — ED Notes (Signed)
Patient participated in" Suicide Safety Planning Workshop" led by Therapist, sports. Patient was engaged and focus.Patient was given a copy of plan and a copy placed in patient chart.
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
## 2022-02-03 NOTE — ED Notes (Signed)
Patient observed/assessed at nursing station. Patient alert and oriented x 4. Affect is flat. Patient complains of pain level 9/10 for HA/Tooth pain.Marland Kitchen He denies A/V/H. He denies having any thoughts/plan of self harm and harm towards others. Fluid and snack offered. Patient states that appetite has been good throughout the day. Last BM was today 02/03/22. Verbalizes no further complaints at this time. Will continue to monitor and support.

## 2022-02-03 NOTE — ED Notes (Signed)
Patient A&Ox4. Patient denies SI/HI and AVH. Patient denies any physical complaints when asked. No acute distress noted. Support and encouragement provided. Routine safety checks conducted according to facility protocol. Encouraged patient to notify staff if thoughts of harm toward self or others arise. Patient verbalize understanding and agreement. Will continue to monitor for safety.    

## 2022-02-03 NOTE — ED Notes (Signed)
Patient requested more scrubs due to him having a bowel movement in his clothes

## 2022-02-03 NOTE — ED Notes (Signed)
Pt is sleeping. No distress noted. Will continue to monitor safety. 

## 2022-02-03 NOTE — ED Notes (Addendum)
Patient observed

## 2022-02-03 NOTE — ED Notes (Signed)
Patient sitting in dayroom eating lunch. Respirations equal and unlabored, skin warm and dry, NAD. No change in assessment or acuity. Q 15 minute safety checks remain in place.

## 2022-02-04 DIAGNOSIS — I10 Essential (primary) hypertension: Secondary | ICD-10-CM | POA: Diagnosis not present

## 2022-02-04 DIAGNOSIS — F1420 Cocaine dependence, uncomplicated: Secondary | ICD-10-CM | POA: Diagnosis not present

## 2022-02-04 DIAGNOSIS — Z720 Tobacco use: Secondary | ICD-10-CM | POA: Diagnosis not present

## 2022-02-04 DIAGNOSIS — E785 Hyperlipidemia, unspecified: Secondary | ICD-10-CM | POA: Diagnosis not present

## 2022-02-04 LAB — GLUCOSE, CAPILLARY
Glucose-Capillary: 138 mg/dL — ABNORMAL HIGH (ref 70–99)
Glucose-Capillary: 157 mg/dL — ABNORMAL HIGH (ref 70–99)
Glucose-Capillary: 208 mg/dL — ABNORMAL HIGH (ref 70–99)
Glucose-Capillary: 212 mg/dL — ABNORMAL HIGH (ref 70–99)

## 2022-02-04 NOTE — ED Provider Notes (Signed)
Behavioral Health Progress Note  Date and Time: 02/04/2022 4:51 PM Name: David Wyatt MRN:  [id number]  Subjective:   David Wyatt is a 61 y.o. male, with PMH cocaine use disorder, tobacco use disorder, housing instability, DM2 + neuropathy, HTN, HLD, no inpatient psych admission, no suicide attempts, who initially presented to [facility] (01/20/2022) for passive SI and assistance with rehab for crack cocaine use. Patient was then IVC by EDP, which was rescinded on 01/21/2022 by Mayra Neer, MD.  He was then admitted Voluntary to [facility] (01/21/2022) via EMS for assistance with residential placement.     Patient reported he feels "good", however his sleep last night was poor, stated he had difficulties staying asleep after waking up to urinate.  Reported that he felt like he was urinating about 15 times that night.  After further discussion, patient stated he had been drinking multiple cups of water, to help with his high blood sugar.  Discussed with patient this is likely a major contributor to his frequent nighttime urinations, he was amenable to limiting water a few hours before bed.  Stated that during the day, he is having much better control of his bladder after increasing his home tamsulosin. He also requested melatonin to help with sleep as well.   Suicidal Thoughts: No (Contracted to safety) Homicidal Thoughts: No Hallucinations: None (Denied AVH)  Mood:  (good) Sleep:Good Appetite: Excessive, slowly normalizing, no longer irritable  Diagnosis:  Final diagnoses:  Dermoid cyst of skin of back  Tobacco abuse  Diabetic polyneuropathy associated with type 2 diabetes mellitus (HCC)  Cocaine use disorder, severe, dependence ([place])    Total Time spent with patient: 30 minutes  Past Psychiatric History: cocaine use disorder, tobacco use disorder, no Psychiatric Hospitalizations or Suicide Attempts    Family Psychiatric History: None Reported   Past Medical History:  Past Medical History:   Diagnosis Date   Angioedema of lips 07/28/2012   left upper (07/29/2012)   Arthritis    hands and back   Chronic back pain    Cocaine abuse (HCC)    Diabetic neuropathy (HCC)    High cholesterol    Hypertension    Neuropathy    Type II diabetes mellitus ([place])     Past Surgical History:  Procedure Laterality Date   BACK SURGERY     CYSTOSCOPY W/ URETERAL STENT PLACEMENT Right 02/23/2021   Procedure: CYSTOSCOPY WITH RETROGRADE PYELOGRAM/URETERAL STENT PLACEMENT;  Surgeon: Janith Lima, MD;  Location: [facility];  Service: Urology;  Laterality: Right;   HERNIA REPAIR Right 04/01/2012   I & D EXTREMITY Left 12/13/2012   Procedure: IRRIGATION AND DEBRIDEMENT LEFT THUMB;  Surgeon: Tennis Must, MD;  Location: [facility];  Service: Orthopedics;  Laterality: Left;   INCISION AND DRAINAGE OF WOUND     boil on back/notes 07/14/2008 (07/29/2012)   INGUINAL HERNIA REPAIR  04/01/2012   Procedure: HERNIA REPAIR INGUINAL ADULT;  Surgeon: Haywood Lasso, MD;  Location: [facility];  Service: General;  Laterality: Right;   INGUINAL HERNIA REPAIR Left 09/06/2016   Procedure: OPEN REPAIR LEFT INGUINAL HERNIA;  Surgeon: Greer Pickerel, MD;  Location: [facility];  Service: General;  Laterality: Left;   INSERTION OF MESH Left 09/06/2016   Procedure: INSERTION OF MESH;  Surgeon: Greer Pickerel, MD;  Location: [facility];  Service: General;  Laterality: Left;   TONSILLECTOMY     Family History:  Family History  Problem Relation Age of Onset   Diabetes Mother    Kidney disease  Mother    Hyperlipidemia Mother    Hyperlipidemia Father    Diabetes Father    Family Psychiatric  History: See H&P Social History:  Social History   Substance and Sexual Activity  Alcohol Use No   Alcohol/week: 0.0 standard drinks of alcohol     Social History   Substance and Sexual Activity  Drug Use Not Currently   Types: "Crack" cocaine    Social History   Socioeconomic History   Marital status: Divorced    Spouse name: Not on file    Number of children: 3   Years of education: 12   Highest education level: Not on file  Occupational History    Comment: disabled  Tobacco Use   Smoking status: Every Day    Packs/day: 0.25    Years: 30.00    Total pack years: 7.50    Types: Cigarettes   Smokeless tobacco: Former    Quit date: 08/15/2015   Tobacco comments:    04/29/16 2  cigs daily, 10/27/17 sometimes < .25 PPD  Vaping Use   Vaping Use: Never used  Substance and Sexual Activity   Alcohol use: No    Alcohol/week: 0.0 standard drinks of alcohol   Drug use: Not Currently    Types: "Crack" cocaine   Sexual activity: Not on file  Other Topics Concern   Not on file  Social History Narrative   04/29/17   Lives in shelter   Caffeine- a lot of  tea, coffee   Social Determinants of Health   Financial Resource Strain: Not on file  Food Insecurity: Not on file  Transportation Needs: Not on file  Physical Activity: Not on file  Stress: Not on file  Social Connections: Not on file   SDOH:  SDOH Screenings   Depression (PHQ2-9): Low Risk  (01/22/2022)  Tobacco Use: High Risk (01/23/2022)   Additional Social History:                          Current Medications:  Current Facility-Administered Medications  Medication Dose Route Frequency Provider Last Rate Last Admin   acetaminophen (TYLENOL) tablet 650 mg  650 mg Oral Q6H PRN Merrily Brittle, DO   650 mg at 02/03/22 1928   alum & mag hydroxide-simeth (MAALOX/MYLANTA) 200-200-20 MG/5ML suspension 30 mL  30 mL Oral Q4H PRN Merrily Brittle, DO       amLODipine (NORVASC) tablet 10 mg  10 mg Oral Daily Merrily Brittle, DO   10 mg at 02/04/22 0912   atorvastatin (LIPITOR) tablet 40 mg  40 mg Oral Daily Merrily Brittle, DO   40 mg at 02/04/22 0912   DULoxetine (CYMBALTA) DR capsule 60 mg  60 mg Oral Daily Merrily Brittle, DO   60 mg at 02/04/22 0912   empagliflozin (JARDIANCE) tablet 10 mg  10 mg Oral QAC breakfast Merrily Brittle, DO   10 mg at 02/04/22 0837   famotidine  (PEPCID) tablet 10 mg  10 mg Oral BID Cinderella, Margaret A   10 mg at 02/04/22 0912   feeding supplement (GLUCERNA SHAKE) (GLUCERNA SHAKE) liquid 237 mL  237 mL Oral TID BM Merrily Brittle, DO   237 mL at 02/04/22 1336   gabapentin (NEURONTIN) capsule 600 mg  600 mg Oral TID Cinderella, Margaret A   600 mg at 02/04/22 1336   glipiZIDE (GLUCOTROL XL) 24 hr tablet 10 mg  10 mg Oral Q breakfast Merrily Brittle, DO   10 mg at  02/04/22 0837   insulin aspart (novoLOG) injection 0-5 Units  0-5 Units Subcutaneous QHS Onu[place], Chinwendu V, NP   4 Units at 01/31/22 2027   insulin aspart (novoLOG) injection 0-6 Units  0-6 Units Subcutaneous TID WC Merrily Brittle, DO   1 Units at 02/04/22 1140   insulin aspart (novoLOG) injection 3 Units  3 Units Subcutaneous BID AC Merrily Brittle, DO   3 Units at 02/04/22 1140   insulin glargine-yfgn (SEMGLEE) injection 24 Units  24 Units Subcutaneous Daily Minda Ditto, RPH   24 Units at 02/04/22 2947   lubriderm seriously sensitive lotion 1 Application  1 Application Topical BID Cinderella, Margaret A   1 Application at 65/46/50 3546   magnesium hydroxide (MILK OF MAGNESIA) suspension 30 mL  30 mL Oral Daily PRN Merrily Brittle, DO       metFORMIN (GLUCOPHAGE) tablet 1,000 mg  1,000 mg Oral BID WC Merrily Brittle, DO   1,000 mg at 02/04/22 0837   nicotine (NICODERM CQ - dosed in mg/24 hours) patch 21 mg  21 mg Transdermal Q0600 Merrily Brittle, DO   21 mg at 02/04/22 5681   nicotine polacrilex (NICORETTE) gum 4 mg  4 mg Oral PRN Merrily Brittle, DO       tamsulosin [facility]) capsule 0.8 mg  0.8 mg Oral Daily Merrily Brittle, DO   0.8 mg at 02/04/22 0912   traZODone (DESYREL) tablet 100 mg  100 mg Oral QHS PRN Freida Busman, MD   100 mg at 02/03/22 2007   Current Outpatient Medications  Medication Sig Dispense Refill   Accu-Chek FastClix Lancets MISC Use as directed 100 each 0   Accu-Chek Softclix Lancets lancets Use up to 4 times daily as directed 100 each 0   amLODipine (NORVASC) 10  MG tablet Take 1 tablet (10 mg total) by mouth daily. 30 tablet 0   atorvastatin (LIPITOR) 40 MG tablet Take 1 tablet (40 mg total) by mouth daily. 30 tablet 0   Blood Glucose Monitoring Suppl (ACCU-CHEK GUIDE) w/Device KIT USE AS DIRECTED TO CHECK BLOOD SUGARS UP TO TWICE PER DAY 1 kit 0   Blood Glucose Monitoring Suppl (ACCU-CHEK GUIDE) w/Device KIT Use as directed 1 kit 0   DULoxetine (CYMBALTA) 60 MG capsule Take 1 capsule (60 mg total) by mouth daily. 30 capsule 0   empagliflozin (JARDIANCE) 10 MG TABS tablet Take 1 tablet (10 mg total) by mouth daily before breakfast. 30 tablet 0   gabapentin (NEURONTIN) 300 MG capsule Take 2 capsules (600 mg total) by mouth 3 (three) times daily. 180 capsule 0   glipiZIDE (GLUCOTROL XL) 10 MG 24 hr tablet Take 1 tablet (10 mg total) by mouth daily with breakfast. 30 tablet 0   glucose blood (ACCU-CHEK GUIDE) test strip Use up to 3 times daily as directed 100 each 3   glucose blood (ACCU-CHEK GUIDE) test strip Use as directed 100 each 0   Insulin Glargine Solostar (LANTUS) 100 UNIT/ML Solostar Pen Inject 24 Units into the skin daily. 15 mL 2   insulin lispro (HUMALOG) 100 UNIT/ML KwikPen Inject 5 Units into the skin 2 (two) times daily before lunch and supper. (Patient not taking: Reported on 01/20/2022) 6 mL 6   Insulin Pen Needle 31G X 8 MM MISC Use with insulin pen 100 each 1   metFORMIN (GLUCOPHAGE) 1000 MG tablet Take 1 tablet (1,000 mg total) by mouth 2 (two) times daily. 60 tablet 0   nicotine (NICODERM CQ - DOSED IN MG/24 HOURS)  21 mg/24hr patch Place 1 patch (21 mg total) onto the skin daily at 6 (six) AM. 28 patch 0   tamsulosin (FLOMAX) 0.4 MG CAPS capsule Take 1 capsule (0.4 mg total) by mouth daily. For renal calculi 30 capsule 0   traZODone (DESYREL) 100 MG tablet Take 1 tablet (100 mg total) by mouth at bedtime as needed for sleep. 15 tablet 0    Labs  Lab Results:  Admission on 01/21/2022  Component Date Value Ref Range Status   Hgb A1c  MFr Bld 01/21/2022 10.4 (H)  4.8 - 5.6 % Final   Comment: (NOTE) Pre diabetes:          5.7%-6.4%  Diabetes:              >6.4%  Glycemic control for   <7.0% adults with diabetes    Mean Plasma Glucose 01/21/2022 251.78  mg/dL Final   Performed at [facility], [place] [address], [place], [place] [address]   TSH 01/21/2022 0.248 (L)  0.350 - 4.500 uIU/mL Final   Comment: Performed by a 3rd Generation assay with a functional sensitivity of <=0.01 uIU/mL. Performed at [facility], [place] [address], [place], [place] [address]    Glucose-Capillary 01/21/2022 374 (H)  70 - 99 mg/dL Final   Glucose reference range applies only to samples taken after fasting for at least 8 hours.   Glucose-Capillary 01/21/2022 289 (H)  70 - 99 mg/dL Final   Glucose reference range applies only to samples taken after fasting for at least 8 hours.   Glucose-Capillary 01/22/2022 137 (H)  70 - 99 mg/dL Final   Glucose reference range applies only to samples taken after fasting for at least 8 hours.   Glucose-Capillary 01/22/2022 297 (H)  70 - 99 mg/dL Final   Glucose reference range applies only to samples taken after fasting for at least 8 hours.   T3, Free 01/23/2022 2.2  2.0 - 4.4 pg/mL Final   Comment: (NOTE) Performed At: [facility] [place], [place] [id number] Rush Farmer MD [id number]    Glucose-Capillary 01/22/2022 94  70 - 99 mg/dL Final   Glucose reference range applies only to samples taken after fasting for at least 8 hours.   Free T4 01/23/2022 0.72  0.61 - 1.12 ng/dL Final   Comment: (NOTE) Biotin ingestion may interfere with free T4 tests. If the results are inconsistent with the TSH level, previous test results, or the clinical presentation, then consider biotin interference. If needed, order repeat testing after stopping biotin. Performed at [facility], [place] [address] [place], [place] [address]    TSH 01/23/2022 0.203 (L)  0.350 - 4.500  uIU/mL Final   Comment: Performed by a 3rd Generation assay with a functional sensitivity of <=0.01 uIU/mL. Performed at [facility], [place] [address], [place], [place] [address]    Glucose-Capillary 01/23/2022 132 (H)  70 - 99 mg/dL Final   Glucose reference range applies only to samples taken after fasting for at least 8 hours.   Glucose-Capillary 01/22/2022 59 (L)  70 - 99 mg/dL Final   Glucose reference range applies only to samples taken after fasting for at least 8 hours.   Glucose-Capillary 01/22/2022 177 (H)  70 - 99 mg/dL Final   Glucose reference range applies only to samples taken after fasting for at least 8 hours.   Glucose-Capillary 01/23/2022 284 (H)  70 - 99 mg/dL Final   Glucose reference range  applies only to samples taken after fasting for at least 8 hours.   Glucose-Capillary 01/23/2022 188 (H)  70 - 99 mg/dL Final   Glucose reference range applies only to samples taken after fasting for at least 8 hours.   Glucose-Capillary 01/23/2022 294 (H)  70 - 99 mg/dL Final   Glucose reference range applies only to samples taken after fasting for at least 8 hours.   Glucose-Capillary 01/24/2022 180 (H)  70 - 99 mg/dL Final   Glucose reference range applies only to samples taken after fasting for at least 8 hours.   Glucose-Capillary 01/24/2022 275 (H)  70 - 99 mg/dL Final   Glucose reference range applies only to samples taken after fasting for at least 8 hours.   Glucose-Capillary 01/24/2022 146 (H)  70 - 99 mg/dL Final   Glucose reference range applies only to samples taken after fasting for at least 8 hours.   Glucose-Capillary 01/24/2022 137 (H)  70 - 99 mg/dL Final   Glucose reference range applies only to samples taken after fasting for at least 8 hours.   Glucose-Capillary 01/25/2022 201 (H)  70 - 99 mg/dL Final   Glucose reference range applies only to samples taken after fasting for at least 8 hours.   Glucose-Capillary 01/25/2022 195 (H)  70 - 99 mg/dL Final    Glucose reference range applies only to samples taken after fasting for at least 8 hours.   Glucose-Capillary 01/25/2022 149 (H)  70 - 99 mg/dL Final   Glucose reference range applies only to samples taken after fasting for at least 8 hours.   Glucose-Capillary 01/25/2022 147 (H)  70 - 99 mg/dL Final   Glucose reference range applies only to samples taken after fasting for at least 8 hours.   Glucose-Capillary 01/26/2022 123 (H)  70 - 99 mg/dL Final   Glucose reference range applies only to samples taken after fasting for at least 8 hours.   Glucose-Capillary 01/26/2022 176 (H)  70 - 99 mg/dL Final   Glucose reference range applies only to samples taken after fasting for at least 8 hours.   Glucose-Capillary 01/26/2022 84  70 - 99 mg/dL Final   Glucose reference range applies only to samples taken after fasting for at least 8 hours.   Glucose-Capillary 01/26/2022 177 (H)  70 - 99 mg/dL Final   Glucose reference range applies only to samples taken after fasting for at least 8 hours.   Glucose-Capillary 01/27/2022 150 (H)  70 - 99 mg/dL Final   Glucose reference range applies only to samples taken after fasting for at least 8 hours.   Glucose-Capillary 01/27/2022 171 (H)  70 - 99 mg/dL Final   Glucose reference range applies only to samples taken after fasting for at least 8 hours.   Glucose-Capillary 01/27/2022 159 (H)  70 - 99 mg/dL Final   Glucose reference range applies only to samples taken after fasting for at least 8 hours.   Glucose-Capillary 01/27/2022 67 (L)  70 - 99 mg/dL Final   Glucose reference range applies only to samples taken after fasting for at least 8 hours.   Glucose-Capillary 01/27/2022 107 (H)  70 - 99 mg/dL Final   Glucose reference range applies only to samples taken after fasting for at least 8 hours.   Glucose-Capillary 01/27/2022 144 (H)  70 - 99 mg/dL Final   Glucose reference range applies only to samples taken after fasting for at least 8 hours.    Glucose-Capillary 01/28/2022 175 (H)  70 - 99 mg/dL Final   Glucose reference range applies only to samples taken after fasting for at least 8 hours.   Glucose-Capillary 01/28/2022 198 (H)  70 - 99 mg/dL Final   Glucose reference range applies only to samples taken after fasting for at least 8 hours.   Glucose-Capillary 01/28/2022 186 (H)  70 - 99 mg/dL Final   Glucose reference range applies only to samples taken after fasting for at least 8 hours.   Glucose-Capillary 01/28/2022 198 (H)  70 - 99 mg/dL Final   Glucose reference range applies only to samples taken after fasting for at least 8 hours.   Glucose-Capillary 01/29/2022 111 (H)  70 - 99 mg/dL Final   Glucose reference range applies only to samples taken after fasting for at least 8 hours.   Glucose-Capillary 01/29/2022 160 (H)  70 - 99 mg/dL Final   Glucose reference range applies only to samples taken after fasting for at least 8 hours.   Glucose-Capillary 01/29/2022 209 (H)  70 - 99 mg/dL Final   Glucose reference range applies only to samples taken after fasting for at least 8 hours.   Glucose-Capillary 01/29/2022 155 (H)  70 - 99 mg/dL Final   Glucose reference range applies only to samples taken after fasting for at least 8 hours.   Glucose-Capillary 01/30/2022 157 (H)  70 - 99 mg/dL Final   Glucose reference range applies only to samples taken after fasting for at least 8 hours.   Glucose-Capillary 01/30/2022 168 (H)  70 - 99 mg/dL Final   Glucose reference range applies only to samples taken after fasting for at least 8 hours.   Glucose-Capillary 01/30/2022 217 (H)  70 - 99 mg/dL Final   Glucose reference range applies only to samples taken after fasting for at least 8 hours.   Glucose-Capillary 01/30/2022 246 (H)  70 - 99 mg/dL Final   Glucose reference range applies only to samples taken after fasting for at least 8 hours.   Glucose-Capillary 01/30/2022 287 (H)  70 - 99 mg/dL Final   Glucose reference range applies only to  samples taken after fasting for at least 8 hours.   Glucose-Capillary 01/31/2022 138 (H)  70 - 99 mg/dL Final   Glucose reference range applies only to samples taken after fasting for at least 8 hours.   Glucose-Capillary 01/31/2022 193 (H)  70 - 99 mg/dL Final   Glucose reference range applies only to samples taken after fasting for at least 8 hours.   Glucose-Capillary 01/31/2022 274 (H)  70 - 99 mg/dL Final   Glucose reference range applies only to samples taken after fasting for at least 8 hours.   Glucose-Capillary 01/31/2022 320 (H)  70 - 99 mg/dL Final   Glucose reference range applies only to samples taken after fasting for at least 8 hours.   Glucose-Capillary 01/31/2022 160 (H)  70 - 99 mg/dL Final   Glucose reference range applies only to samples taken after fasting for at least 8 hours.   Glucose-Capillary 02/01/2022 184 (H)  70 - 99 mg/dL Final   Glucose reference range applies only to samples taken after fasting for at least 8 hours.   Glucose-Capillary 02/01/2022 138 (H)  70 - 99 mg/dL Final   Glucose reference range applies only to samples taken after fasting for at least 8 hours.   Glucose-Capillary 02/01/2022 171 (H)  70 - 99 mg/dL Final   Glucose reference range applies only to samples taken after fasting for at  least 8 hours.   Glucose-Capillary 02/01/2022 124 (H)  70 - 99 mg/dL Final   Glucose reference range applies only to samples taken after fasting for at least 8 hours.   Glucose-Capillary 02/02/2022 112 (H)  70 - 99 mg/dL Final   Glucose reference range applies only to samples taken after fasting for at least 8 hours.   Glucose-Capillary 02/02/2022 125 (H)  70 - 99 mg/dL Final   Glucose reference range applies only to samples taken after fasting for at least 8 hours.   Glucose-Capillary 02/02/2022 274 (H)  70 - 99 mg/dL Final   Glucose reference range applies only to samples taken after fasting for at least 8 hours.   Glucose-Capillary 02/02/2022 239 (H)  70 - 99  mg/dL Final   Glucose reference range applies only to samples taken after fasting for at least 8 hours.   Glucose-Capillary 02/02/2022 190 (H)  70 - 99 mg/dL Final   Glucose reference range applies only to samples taken after fasting for at least 8 hours.   Glucose-Capillary 02/02/2022 183 (H)  70 - 99 mg/dL Final   Glucose reference range applies only to samples taken after fasting for at least 8 hours.   Glucose-Capillary 02/03/2022 113 (H)  70 - 99 mg/dL Final   Glucose reference range applies only to samples taken after fasting for at least 8 hours.   Glucose-Capillary 02/03/2022 158 (H)  70 - 99 mg/dL Final   Glucose reference range applies only to samples taken after fasting for at least 8 hours.   Glucose-Capillary 02/03/2022 100 (H)  70 - 99 mg/dL Final   Glucose reference range applies only to samples taken after fasting for at least 8 hours.   Glucose-Capillary 02/03/2022 162 (H)  70 - 99 mg/dL Final   Glucose reference range applies only to samples taken after fasting for at least 8 hours.   Glucose-Capillary 02/03/2022 114 (H)  70 - 99 mg/dL Final   Glucose reference range applies only to samples taken after fasting for at least 8 hours.   Glucose-Capillary 02/04/2022 138 (H)  70 - 99 mg/dL Final   Glucose reference range applies only to samples taken after fasting for at least 8 hours.   Glucose-Capillary 02/04/2022 157 (H)  70 - 99 mg/dL Final   Glucose reference range applies only to samples taken after fasting for at least 8 hours.  Admission on 01/20/2022, Discharged on 01/21/2022  Component Date Value Ref Range Status   SARS Coronavirus 2 by RT PCR 01/20/2022 NEGATIVE  NEGATIVE Final   Comment: (NOTE) SARS-CoV-2 target nucleic acids are NOT DETECTED.  The SARS-CoV-2 RNA is generally detectable in upper respiratory specimens during the acute phase of infection. The lowest concentration of SARS-CoV-2 viral copies this assay can detect is 138 copies/mL. A negative result  does not preclude SARS-Cov-2 infection and should not be used as the sole basis for treatment or other patient management decisions. A negative result may occur with  improper specimen collection/handling, submission of specimen other than nasopharyngeal swab, presence of viral mutation(s) within the areas targeted by this assay, and inadequate number of viral copies(<138 copies/mL). A negative result must be combined with clinical observations, patient history, and epidemiological information. The expected result is Negative.  Fact Sheet for Patients:  EntrepreneurPulse.com.au  Fact Sheet for Healthcare Providers:  IncredibleEmployment.be  This test is no  t yet approved or cleared by the [place] and  has been authorized for detection and/or diagnosis of SARS-CoV-2 by FDA under an Emergency Use Authorization (EUA). This EUA will remain  in effect (meaning this test can be used) for the duration of the COVID-19 declaration under Section 564(b)(1) of the Act, 21 U.S.C.section 360bbb-3(b)(1), unless the authorization is terminated  or revoked sooner.       Influenza A by PCR 01/20/2022 NEGATIVE  NEGATIVE Final   Influenza B by PCR 01/20/2022 NEGATIVE  NEGATIVE Final   Comment: (NOTE) The Xpert Xpress SARS-CoV-2/FLU/RSV plus assay is intended as an aid in the diagnosis of influenza from Nasopharyngeal swab specimens and should not be used as a sole basis for treatment. Nasal washings and aspirates are unacceptable for Xpert Xpress SARS-CoV-2/FLU/RSV testing.  Fact Sheet for Patients: EntrepreneurPulse.com.au  Fact Sheet for Healthcare Providers: IncredibleEmployment.be  This test is not yet approved or cleared by the [place] FDA and has been authorized for detection and/or diagnosis of SARS-CoV-2 by FDA under an Emergency Use Authorization (EUA). This EUA will  remain in effect (meaning this test can be used) for the duration of the COVID-19 declaration under Section 564(b)(1) of the Act, 21 U.S.C. section 360bbb-3(b)(1), unless the authorization is terminated or revoked.  Performed at [facility], [place] [address], [place], [place] [address]    Sodium 01/20/2022 136  135 - 145 mmol/L Final   Potassium 01/20/2022 4.0  3.5 - 5.1 mmol/L Final   Chloride 01/20/2022 103  98 - 111 mmol/L Final   CO2 01/20/2022 24  22 - 32 mmol/L Final   Glucose, Bld 01/20/2022 360 (H)  70 - 99 mg/dL Final   Glucose reference range applies only to samples taken after fasting for at least 8 hours.   BUN 01/20/2022 12  8 - 23 mg/dL Final   Creatinine, Ser 01/20/2022 1.01  0.61 - 1.24 mg/dL Final   Calcium 01/20/2022 9.4  8.9 - 10.3 mg/dL Final   Total Protein 01/20/2022 7.3  6.5 - 8.1 g/dL Final   Albumin 01/20/2022 3.4 (L)  3.5 - 5.0 g/dL Final   AST 01/20/2022 14 (L)  15 - 41 U/L Final   ALT 01/20/2022 10  0 - 44 U/L Final   Alkaline Phosphatase 01/20/2022 101  38 - 126 U/L Final   Total Bilirubin 01/20/2022 0.3  0.3 - 1.2 mg/dL Final   GFR, Estimated 01/20/2022 >60  >60 mL/min Final   Comment: (NOTE) Calculated using the CKD-EPI Creatinine Equation (2021)    Anion gap 01/20/2022 9  5 - 15 Final   Performed at [facility] [address] [place], [place] [address]   Alcohol, Ethyl (B) 01/20/2022 <10  <10 mg/dL Final   Comment: (NOTE) Lowest detectable limit for serum alcohol is 10 mg/dL.  For medical purposes only. Performed at [facility], [place] [address], [place], [place] [address]    Opiates 01/20/2022 NONE DETECTED  NONE DETECTED Final   Cocaine 01/20/2022 POSITIVE (A)  NONE DETECTED Final   Benzodiazepines 01/20/2022 NONE DETECTED  NONE DETECTED Final   Amphetamines 01/20/2022 NONE DETECTED  NONE DETECTED Final   Tetrahydrocannabinol 01/20/2022 NONE DETECTED  NONE DETECTED Final   Barbiturates 01/20/2022 NONE DETECTED  NONE  DETECTED Final   Comment: (NOTE) DRUG SCREEN FOR MEDICAL PURPOSES ONLY.  IF CONFIRMATION IS NEEDED FOR ANY PURPOSE, NOTIFY LAB WITHIN 5 DAYS.  LOWEST DETECTABLE LIMITS FOR URINE DRUG SCREEN Drug Class  Cutoff (ng/mL) Amphetamine and metabolites    1000 Barbiturate and metabolites    200 Benzodiazepine                 200 Opiates and metabolites        300 Cocaine and metabolites        300 THC                            50 Performed at [facility], [place] [address] [place], [place] [address]    WBC 01/20/2022 5.2  4.0 - 10.5 K/uL Final   RBC 01/20/2022 4.73  4.22 - 5.81 MIL/uL Final   Hemoglobin 01/20/2022 14.3  13.0 - 17.0 g/dL Final   HCT 01/20/2022 43.1  39.0 - 52.0 % Final   MCV 01/20/2022 91.1  80.0 - 100.0 fL Final   MCH 01/20/2022 30.2  26.0 - 34.0 pg Final   MCHC 01/20/2022 33.2  30.0 - 36.0 g/dL Final   RDW 01/20/2022 14.1  11.5 - 15.5 % Final   Platelets 01/20/2022 444 (H)  150 - 400 K/uL Final   nRBC 01/20/2022 0.0  0.0 - 0.2 % Final   Neutrophils Relative % 01/20/2022 61  % Final   Neutro Abs 01/20/2022 3.2  1.7 - 7.7 K/uL Final   Lymphocytes Relative 01/20/2022 29  % Final   Lymphs Abs 01/20/2022 1.5  0.7 - 4.0 K/uL Final   Monocytes Relative 01/20/2022 7  % Final   Monocytes Absolute 01/20/2022 0.4  0.1 - 1.0 K/uL Final   Eosinophils Relative 01/20/2022 2  % Final   Eosinophils Absolute 01/20/2022 0.1  0.0 - 0.5 K/uL Final   Basophils Relative 01/20/2022 1  % Final   Basophils Absolute 01/20/2022 0.0  0.0 - 0.1 K/uL Final   Immature Granulocytes 01/20/2022 0  % Final   Abs Immature Granulocytes 01/20/2022 0.01  0.00 - 0.07 K/uL Final   Performed at [facility], [place] [address] [place], [place] [address]   Salicylate Lvl 28/36/6294 <7.0 (L)  7.0 - 30.0 mg/dL Final   Performed at [facility] [address] [place], [place] [address]   Acetaminophen (Tylenol), Serum 01/20/2022 <10 (L)  10 - 30 ug/mL Final   Comment:  (NOTE) Therapeutic concentrations vary significantly. A range of 10-30 ug/mL  may be an effective concentration for many patients. However, some  are best treated at concentrations outside of this range. Acetaminophen concentrations >150 ug/mL at 4 hours after ingestion  and >50 ug/mL at 12 hours after ingestion are often associated with  toxic reactions.  Performed at [facility], [place] [address], [place], [place] [address]    Glucose-Capillary 01/20/2022 344 (H)  70 - 99 mg/dL Final   Glucose reference range applies only to samples taken after fasting for at least 8 hours.   Comment 1 01/20/2022 Notify RN   Final   Comment 2 01/20/2022 Document in Chart   Final   Glucose-Capillary 01/20/2022 479 (H)  70 - 99 mg/dL Final   Glucose reference range applies only to samples taken after fasting for at least 8 hours.   Glucose-Capillary 01/21/2022 243 (H)  70 - 99 mg/dL Final   Glucose reference range applies only to samples taken after fasting for at least 8 hours.   Glucose-Capillary 01/21/2022 224 (H)  70 - 99 mg/dL Final   Glucose reference range applies only to samples taken  after fasting for at least 8 hours.   Glucose-Capillary 01/21/2022 282 (H)  70 - 99 mg/dL Final   Glucose reference range applies only to samples taken after fasting for at least 8 hours.  Admission on 01/18/2022, Discharged on 01/19/2022  Component Date Value Ref Range Status   WBC 01/18/2022 5.3  4.0 - 10.5 K/uL Final   RBC 01/18/2022 4.47  4.22 - 5.81 MIL/uL Final   Hemoglobin 01/18/2022 13.7  13.0 - 17.0 g/dL Final   HCT 01/18/2022 41.0  39.0 - 52.0 % Final   MCV 01/18/2022 91.7  80.0 - 100.0 fL Final   MCH 01/18/2022 30.6  26.0 - 34.0 pg Final   MCHC 01/18/2022 33.4  30.0 - 36.0 g/dL Final   RDW 01/18/2022 14.2  11.5 - 15.5 % Final   Platelets 01/18/2022 445 (H)  150 - 400 K/uL Final   nRBC 01/18/2022 0.0  0.0 - 0.2 % Final   Performed at [facility], [place] [address], [place], [place] [address]    Sodium 01/18/2022 136  135 - 145 mmol/L Final   Potassium 01/18/2022 4.3  3.5 - 5.1 mmol/L Final   Chloride 01/18/2022 100  98 - 111 mmol/L Final   CO2 01/18/2022 22  22 - 32 mmol/L Final   Glucose, Bld 01/18/2022 499 (H)  70 - 99 mg/dL Final   Glucose reference range applies only to samples taken after fasting for at least 8 hours.   BUN 01/18/2022 9  8 - 23 mg/dL Final   Creatinine, Ser 01/18/2022 1.23  0.61 - 1.24 mg/dL Final   Calcium 01/18/2022 9.1  8.9 - 10.3 mg/dL Final   Total Protein 01/18/2022 6.6  6.5 - 8.1 g/dL Final   Albumin 01/18/2022 3.2 (L)  3.5 - 5.0 g/dL Final   AST 01/18/2022 18  15 - 41 U/L Final   ALT 01/18/2022 9  0 - 44 U/L Final   Alkaline Phosphatase 01/18/2022 95  38 - 126 U/L Final   Total Bilirubin 01/18/2022 0.3  0.3 - 1.2 mg/dL Final   GFR, Estimated 01/18/2022 >60  >60 mL/min Final   Comment: (NOTE) Calculated using the CKD-EPI Creatinine Equation (2021)    Anion gap 01/18/2022 14  5 - 15 Final   Performed at [facility] [address], [place], [place] [address]   Color, Urine 01/18/2022 YELLOW  YELLOW Final   APPearance 01/18/2022 CLEAR  CLEAR Final   Specific Gravity, Urine 01/18/2022 1.025  1.005 - 1.030 Final   pH 01/18/2022 6.0  5.0 - 8.0 Final   Glucose, UA 01/18/2022 >=500 (A)  NEGATIVE mg/dL Final   Hgb urine dipstick 01/18/2022 NEGATIVE  NEGATIVE Final   Bilirubin Urine 01/18/2022 NEGATIVE  NEGATIVE Final   Ketones, ur 01/18/2022 NEGATIVE  NEGATIVE mg/dL Final   Protein, ur 01/18/2022 NEGATIVE  NEGATIVE mg/dL Final   Nitrite 01/18/2022 NEGATIVE  NEGATIVE Final   Leukocytes,Ua 01/18/2022 SMALL (A)  NEGATIVE Final   RBC / HPF 01/18/2022 0-5  0 - 5 RBC/hpf Final   WBC, UA 01/18/2022 21-50  0 - 5 WBC/hpf Final   Bacteria, UA 01/18/2022 RARE (A)  NONE SEEN Final   Squamous Epithelial / LPF 01/18/2022 0-5  0 - 5 Final   Budding Yeast 01/18/2022 PRESENT   Final   Performed at [facility], [place] [address] [place],   [address]   Opiates 01/18/2022 NONE DETECTED  NONE DETECTED Final   Cocaine 01/18/2022 POSITIVE (A)  NONE DETECTED Final   Benzodiazepines 01/18/2022 NONE DETECTED  NONE DETECTED Final   Amphetamines 01/18/2022 NONE DETECTED  NONE DETECTED Final   Tetrahydrocannabinol 01/18/2022 NONE DETECTED  NONE DETECTED Final   Barbiturates 01/18/2022 NONE DETECTED  NONE DETECTED Final   Comment: (NOTE) DRUG SCREEN FOR MEDICAL PURPOSES ONLY.  IF CONFIRMATION IS NEEDED FOR ANY PURPOSE, NOTIFY LAB WITHIN 5 DAYS.  LOWEST DETECTABLE LIMITS FOR URINE DRUG SCREEN Drug Class                     Cutoff (ng/mL) Amphetamine and metabolites    1000 Barbiturate and metabolites    200 Benzodiazepine                 200 Opiates and metabolites        300 Cocaine and metabolites        300 THC                            50 Performed at [facility], [place] [address] [place], [place] [address]    Glucose-Capillary 01/18/2022 483 (H)  70 - 99 mg/dL Final   Glucose reference range applies only to samples taken after fasting for at least 8 hours.   Glucose-Capillary 01/19/2022 586 (HH)  70 - 99 mg/dL Final   Glucose reference range applies only to samples taken after fasting for at least 8 hours.   Glucose-Capillary 01/19/2022 456 (H)  70 - 99 mg/dL Final   Glucose reference range applies only to samples taken after fasting for at least 8 hours.   Glucose-Capillary 01/19/2022 515 (HH)  70 - 99 mg/dL Final   Glucose reference range applies only to samples taken after fasting for at least 8 hours.   Comment 1 01/19/2022 Notify RN   Final   Glucose-Capillary 01/19/2022 164 (H)  70 - 99 mg/dL Final   Glucose reference range applies only to samples taken after fasting for at least 8 hours.  Admission on 01/07/2022, Discharged on 01/07/2022  Component Date Value Ref Range Status   Glucose-Capillary 01/07/2022 267 (H)  70 - 99 mg/dL Final   Glucose reference range applies only to samples taken after fasting for at  least 8 hours.  Admission on 01/02/2022, Discharged on 01/02/2022  Component Date Value Ref Range Status   WBC 01/02/2022 7.0  4.0 - 10.5 K/uL Final   RBC 01/02/2022 4.54  4.22 - 5.81 MIL/uL Final   Hemoglobin 01/02/2022 13.9  13.0 - 17.0 g/dL Final   HCT 01/02/2022 41.4  39.0 - 52.0 % Final   MCV 01/02/2022 91.2  80.0 - 100.0 fL Final   MCH 01/02/2022 30.6  26.0 - 34.0 pg Final   MCHC 01/02/2022 33.6  30.0 - 36.0 g/dL Final   RDW 01/02/2022 14.1  11.5 - 15.5 % Final   Platelets 01/02/2022 369  150 - 400 K/uL Final   nRBC 01/02/2022 0.0  0.0 - 0.2 % Final   Neutrophils Relative % 01/02/2022 59  % Final   Neutro Abs 01/02/2022 4.1  1.7 - 7.7 K/uL Final   Lymphocytes Relative 01/02/2022 28  % Final   Lymphs Abs 01/02/2022 1.9  0.7 - 4.0 K/uL Final   Monocytes Relative 01/02/2022 8  % Final   Monocytes Absolute 01/02/2022 0.5  0.1 - 1.0 K/uL Final   Eosinophils Relative 01/02/2022 4  % Final   Eosinophils Absolute 01/02/2022 0.3  0.0 -  0.5 K/uL Final   Basophils Relative 01/02/2022 1  % Final   Basophils Absolute 01/02/2022 0.1  0.0 - 0.1 K/uL Final   Immature Granulocytes 01/02/2022 0  % Final   Abs Immature Granulocytes 01/02/2022 0.02  0.00 - 0.07 K/uL Final   Performed at [facility], [place] [address], [place], [place] [address]   Sodium 01/02/2022 138  135 - 145 mmol/L Final   Potassium 01/02/2022 4.1  3.5 - 5.1 mmol/L Final   Chloride 01/02/2022 104  98 - 111 mmol/L Final   CO2 01/02/2022 25  22 - 32 mmol/L Final   Glucose, Bld 01/02/2022 216 (H)  70 - 99 mg/dL Final   Glucose reference range applies only to samples taken after fasting for at least 8 hours.   BUN 01/02/2022 11  8 - 23 mg/dL Final   Creatinine, Ser 01/02/2022 0.98  0.61 - 1.24 mg/dL Final   Calcium 01/02/2022 9.5  8.9 - 10.3 mg/dL Final   Total Protein 01/02/2022 7.4  6.5 - 8.1 g/dL Final   Albumin 01/02/2022 3.4 (L)  3.5 - 5.0 g/dL Final   AST 01/02/2022 12 (L)  15 - 41 U/L Final   ALT 01/02/2022 11  0 -  44 U/L Final   Alkaline Phosphatase 01/02/2022 90  38 - 126 U/L Final   Total Bilirubin 01/02/2022 0.9  0.3 - 1.2 mg/dL Final   GFR, Estimated 01/02/2022 >60  >60 mL/min Final   Comment: (NOTE) Calculated using the CKD-EPI Creatinine Equation (2021)    Anion gap 01/02/2022 9  5 - 15 Final   Performed at [facility] [address], [place], [place] [address]  [facility] on 11/01/2021  Component Date Value Ref Range Status   POC Glucose 11/27/2021 301 (A)  70 - 99 mg/dl Final   1 hr after lunch, had not taken insulin  Admission on 10/13/2021, Discharged on 10/15/2021  Component Date Value Ref Range Status   Glucose-Capillary 10/13/2021 388 (H)  70 - 99 mg/dL Final   Glucose reference range applies only to samples taken after fasting for at least 8 hours.   SARS Coronavirus 2 by RT PCR 10/13/2021 NEGATIVE  NEGATIVE Final   Comment: (NOTE) SARS-CoV-2 target nucleic acids are NOT DETECTED.  The SARS-CoV-2 RNA is generally detectable in upper respiratory specimens during the acute phase of infection. The lowest concentration of SARS-CoV-2 viral copies this assay can detect is 138 copies/mL. A negative result does not preclude SARS-Cov-2 infection and should not be used as the sole basis for treatment or other patient management decisions. A negative result may occur with  improper specimen collection/handling, submission of specimen other than nasopharyngeal swab, presence of viral mutation(s) within the areas targeted by this assay, and inadequate number of viral copies(<138 copies/mL). A negative result must be combined with clinical observations, patient history, and epidemiological information. The expected result is Negative.  Fact Sheet for Patients:  EntrepreneurPulse.com.au  Fact Sheet for Healthcare Providers:  IncredibleEmployment.be  This test is no                          t yet approved or cleared by the  [place] FDA and  has been authorized for detection and/or diagnosis of SARS-CoV-2 by FDA under an Emergency Use Authorization (EUA). This EUA will remain  in effect (meaning this test can be used) for the duration of the COVID-19 declaration under Section 564(b)(1) of  the Act, 21 U.S.C.section 360bbb-3(b)(1), unless the authorization is terminated  or revoked sooner.       Influenza A by PCR 10/13/2021 NEGATIVE  NEGATIVE Final   Influenza B by PCR 10/13/2021 NEGATIVE  NEGATIVE Final   Comment: (NOTE) The Xpert Xpress SARS-CoV-2/FLU/RSV plus assay is intended as an aid in the diagnosis of influenza from Nasopharyngeal swab specimens and should not be used as a sole basis for treatment. Nasal washings and aspirates are unacceptable for Xpert Xpress SARS-CoV-2/FLU/RSV testing.  Fact Sheet for Patients: EntrepreneurPulse.com.au  Fact Sheet for Healthcare Providers: IncredibleEmployment.be  This test is not yet approved or cleared by the [place] FDA and has been authorized for detection and/or diagnosis of SARS-CoV-2 by FDA under an Emergency Use Authorization (EUA). This EUA will remain in effect (meaning this test can be used) for the duration of the COVID-19 declaration under Section 564(b)(1) of the Act, 21 U.S.C. section 360bbb-3(b)(1), unless the authorization is terminated or revoked.  Performed at [facility], [place] [address] [place], [place] [address]    WBC 10/13/2021 9.5  4.0 - 10.5 K/uL Final   RBC 10/13/2021 4.23  4.22 - 5.81 MIL/uL Final   Hemoglobin 10/13/2021 13.0  13.0 - 17.0 g/dL Final   HCT 10/13/2021 40.4  39.0 - 52.0 % Final   MCV 10/13/2021 95.5  80.0 - 100.0 fL Final   MCH 10/13/2021 30.7  26.0 - 34.0 pg Final   MCHC 10/13/2021 32.2  30.0 - 36.0 g/dL Final   RDW 10/13/2021 13.4  11.5 - 15.5 % Final   Platelets 10/13/2021 551 (H)  150 - 400 K/uL Final   nRBC 10/13/2021 0.0  0.0 - 0.2 % Final    Neutrophils Relative % 10/13/2021 68  % Final   Neutro Abs 10/13/2021 6.5  1.7 - 7.7 K/uL Final   Lymphocytes Relative 10/13/2021 18  % Final   Lymphs Abs 10/13/2021 1.7  0.7 - 4.0 K/uL Final   Monocytes Relative 10/13/2021 9  % Final   Monocytes Absolute 10/13/2021 0.8  0.1 - 1.0 K/uL Final   Eosinophils Relative 10/13/2021 3  % Final   Eosinophils Absolute 10/13/2021 0.3  0.0 - 0.5 K/uL Final   Basophils Relative 10/13/2021 1  % Final   Basophils Absolute 10/13/2021 0.1  0.0 - 0.1 K/uL Final   Immature Granulocytes 10/13/2021 1  % Final   Abs Immature Granulocytes 10/13/2021 0.11 (H)  0.00 - 0.07 K/uL Final   Performed at [facility], [place] [address], [place], [place] [address]   Sodium 10/13/2021 136  135 - 145 mmol/L Final   Potassium 10/13/2021 4.3  3.5 - 5.1 mmol/L Final   Chloride 10/13/2021 104  98 - 111 mmol/L Final   CO2 10/13/2021 24  22 - 32 mmol/L Final   Glucose, Bld 10/13/2021 296 (H)  70 - 99 mg/dL Final   Glucose reference range applies only to samples taken after fasting for at least 8 hours.   BUN 10/13/2021 14  8 - 23 mg/dL Final   Creatinine, Ser 10/13/2021 1.15  0.61 - 1.24 mg/dL Final   Calcium 10/13/2021 9.0  8.9 - 10.3 mg/dL Final   Total Protein 10/13/2021 7.5  6.5 - 8.1 g/dL Final   Albumin 10/13/2021 3.0 (L)  3.5 - 5.0 g/dL Final   AST 10/13/2021 13 (L)  15 - 41 U/L Final   ALT 10/13/2021 10  0 - 44 U/L Final   Alkaline Phosphatase 10/13/2021 84  38 - 126 U/L Final   Total Bilirubin 10/13/2021 0.5  0.3 - 1.2 mg/dL Final   GFR, Estimated 10/13/2021 >60  >60 mL/min Final   Comment: (NOTE) Calculated using the CKD-EPI Creatinine Equation (2021)    Anion gap 10/13/2021 8  5 - 15 Final   Performed at [facility] [address] [place], [place] [address]   Color, Urine 10/13/2021 YELLOW  YELLOW Final   APPearance 10/13/2021 CLOUDY (A)  CLEAR Final   Specific Gravity, Urine 10/13/2021 1.022  1.005 - 1.030 Final   pH 10/13/2021 5.0  5.0 - 8.0  Final   Glucose, UA 10/13/2021 >=500 (A)  NEGATIVE mg/dL Final   Hgb urine dipstick 10/13/2021 LARGE (A)  NEGATIVE Final   Bilirubin Urine 10/13/2021 NEGATIVE  NEGATIVE Final   Ketones, ur 10/13/2021 NEGATIVE  NEGATIVE mg/dL Final   Protein, ur 10/13/2021 30 (A)  NEGATIVE mg/dL Final   Nitrite 10/13/2021 NEGATIVE  NEGATIVE Final   Leukocytes,Ua 10/13/2021 LARGE (A)  NEGATIVE Final   RBC / HPF 10/13/2021 >50 (H)  0 - 5 RBC/hpf Final   WBC, UA 10/13/2021 >50 (H)  0 - 5 WBC/hpf Final   Bacteria, UA 10/13/2021 RARE (A)  NONE SEEN Final   Squamous Epithelial / LPF 10/13/2021 0-5  0 - 5 Final   WBC Clumps 10/13/2021 PRESENT   Final   Mucus 10/13/2021 PRESENT   Final   Budding Yeast 10/13/2021 PRESENT   Final   Non Squamous Epithelial 10/13/2021 0-5 (A)  NONE SEEN Final   Performed at [facility], [place] [address], [place], [place] [address]   pH, Ven 10/13/2021 7.457 (H)  7.25 - 7.43 Final   pCO2, Ven 10/13/2021 38.1 (L)  44 - 60 mmHg Final   pO2, Ven 10/13/2021 174 (H)  32 - 45 mmHg Final   Bicarbonate 10/13/2021 26.9  20.0 - 28.0 mmol/L Final   TCO2 10/13/2021 28  22 - 32 mmol/L Final   O2 Saturation 10/13/2021 100  % Final   Acid-Base Excess 10/13/2021 3.0 (H)  0.0 - 2.0 mmol/L Final   Sodium 10/13/2021 137  135 - 145 mmol/L Final   Potassium 10/13/2021 4.3  3.5 - 5.1 mmol/L Final   Calcium, Ion 10/13/2021 1.10 (L)  1.15 - 1.40 mmol/L Final   HCT 10/13/2021 40.0  39.0 - 52.0 % Final   Hemoglobin 10/13/2021 13.6  13.0 - 17.0 g/dL Final   Sample type 10/13/2021 VENOUS   Final   Opiates 10/13/2021 NONE DETECTED  NONE DETECTED Final   Cocaine 10/13/2021 POSITIVE (A)  NONE DETECTED Final   Benzodiazepines 10/13/2021 NONE DETECTED  NONE DETECTED Final   Amphetamines 10/13/2021 NONE DETECTED  NONE DETECTED Final   Tetrahydrocannabinol 10/13/2021 NONE DETECTED  NONE DETECTED Final   Barbiturates 10/13/2021 NONE DETECTED  NONE DETECTED Final   Comment: (NOTE) DRUG SCREEN FOR MEDICAL  PURPOSES ONLY.  IF CONFIRMATION IS NEEDED FOR ANY PURPOSE, NOTIFY LAB WITHIN 5 DAYS.  LOWEST DETECTABLE LIMITS FOR URINE DRUG SCREEN Drug Class                     Cutoff (ng/mL) Amphetamine and metabolites    1000 Barbiturate and metabolites    200 Benzodiazepine                 423 Tricyclics and metabolites     300 Opiates and metabolites        300 Cocaine and metabolites  300 THC                            50 Performed at [facility], [place] [address], [place], [place] [address]    Adenovirus 10/13/2021 NOT DETECTED  NOT DETECTED Final   Coronavirus 229E 10/13/2021 NOT DETECTED  NOT DETECTED Final   Comment: (NOTE) The Coronavirus on the Respiratory Panel, DOES NOT test for the novel  Coronavirus (2019 nCoV)    Coronavirus HKU1 10/13/2021 NOT DETECTED  NOT DETECTED Final   Coronavirus NL63 10/13/2021 NOT DETECTED  NOT DETECTED Final   Coronavirus OC43 10/13/2021 NOT DETECTED  NOT DETECTED Final   Metapneumovirus 10/13/2021 NOT DETECTED  NOT DETECTED Final   Rhinovirus / Enterovirus 10/13/2021 NOT DETECTED  NOT DETECTED Final   Influenza A 10/13/2021 NOT DETECTED  NOT DETECTED Final   Influenza B 10/13/2021 NOT DETECTED  NOT DETECTED Final   Parainfluenza Virus 1 10/13/2021 NOT DETECTED  NOT DETECTED Final   Parainfluenza Virus 2 10/13/2021 NOT DETECTED  NOT DETECTED Final   Parainfluenza Virus 3 10/13/2021 NOT DETECTED  NOT DETECTED Final   Parainfluenza Virus 4 10/13/2021 NOT DETECTED  NOT DETECTED Final   Respiratory Syncytial Virus 10/13/2021 NOT DETECTED  NOT DETECTED Final   Bordetella pertussis 10/13/2021 NOT DETECTED  NOT DETECTED Final   Bordetella Parapertussis 10/13/2021 NOT DETECTED  NOT DETECTED Final   Chlamydophila pneumoniae 10/13/2021 NOT DETECTED  NOT DETECTED Final   Mycoplasma pneumoniae 10/13/2021 NOT DETECTED  NOT DETECTED Final   Performed at [facility], [place] [address], [place],  [address]   Procalcitonin 10/13/2021 <0.10   ng/mL Final   Comment:        Interpretation: PCT (Procalcitonin) <= 0.5 ng/mL: Systemic infection (sepsis) is not likely. Local bacterial infection is possible. (NOTE)       Sepsis PCT Algorithm           Lower Respiratory Tract                                      Infection PCT Algorithm    ----------------------------     ----------------------------         PCT < 0.25 ng/mL                PCT < 0.10 ng/mL          Strongly encourage             Strongly discourage   discontinuation of antibiotics    initiation of antibiotics    ----------------------------     -----------------------------       PCT 0.25 - 0.50 ng/mL            PCT 0.10 - 0.25 ng/mL               OR       >80% decrease in PCT            Discourage initiation of                                            antibiotics      Encourage discontinuation           of antibiotics    ----------------------------     -----------------------------  PCT >= 0.50 ng/mL              PCT 0.26 - 0.50 ng/mL               AND                                 <80% decrease in PCT             Encourage initiation of                                             antibiotics       Encourage continuation           of antibiotics    ----------------------------     -----------------------------        PCT >= 0.50 ng/mL                  PCT > 0.50 ng/mL               AND         increase in PCT                  Strongly encourage                                      initiation of antibiotics    Strongly encourage escalation           of antibiotics                                     -----------------------------                                           PCT <= 0.25 ng/mL                                                 OR                                        > 80% decrease in PCT                                      Discontinue / Do not initiate                                             antibiotics  Performed at [facility], [address], [place], [place] [address]    B Natriuretic Peptide 10/13/2021 23.6  0.0 - 100.0 pg/mL Final   Performed at [facility]  [address] [place], [place] [address]   L. pneumophila Serogp 1 Ur Ag 10/13/2021 Negative  Negative Final   Comment: (NOTE) Presumptive negative for L. pneumophila serogroup 1 antigen in urine, suggesting no recent or current infection. Legionnaires' disease cannot be ruled out since other serogroups and species may also cause disease. Performed At: [facility] [place], [place] [id number] Rush Farmer MD HQ:[id number]    Source of Sample 10/13/2021 URINE, RANDOM   Final   Performed at [facility], [place] [address], [place], [place] [address]   Strep Pneumo Urinary Antigen 10/13/2021 NEGATIVE  NEGATIVE Final   Comment:        Infection due to S. pneumoniae cannot be absolutely ruled out since the antigen present may be below the detection limit of the test. Performed at [facility], [address] [place], [place] [address]    Sodium 10/14/2021 138  135 - 145 mmol/L Final   Potassium 10/14/2021 4.3  3.5 - 5.1 mmol/L Final   Chloride 10/14/2021 106  98 - 111 mmol/L Final   CO2 10/14/2021 25  22 - 32 mmol/L Final   Glucose, Bld 10/14/2021 286 (H)  70 - 99 mg/dL Final   Glucose reference range applies only to samples taken after fasting for at least 8 hours.   BUN 10/14/2021 14  8 - 23 mg/dL Final   Creatinine, Ser 10/14/2021 0.98  0.61 - 1.24 mg/dL Final   Calcium 10/14/2021 8.9  8.9 - 10.3 mg/dL Final   GFR, Estimated 10/14/2021 >60  >60 mL/min Final   Comment: (NOTE) Calculated using the CKD-EPI Creatinine Equation (2021)    Anion gap 10/14/2021 7  5 - 15 Final   Performed at [facility], [place] [address], [place], [place] [address]   WBC 10/14/2021 9.6  4.0 - 10.5 K/uL Final   RBC 10/14/2021 4.12 (L)  4.22 - 5.81 MIL/uL Final   Hemoglobin 10/14/2021 12.6 (L)  13.0 - 17.0  g/dL Final   HCT 10/14/2021 38.8 (L)  39.0 - 52.0 % Final   MCV 10/14/2021 94.2  80.0 - 100.0 fL Final   MCH 10/14/2021 30.6  26.0 - 34.0 pg Final   MCHC 10/14/2021 32.5  30.0 - 36.0 g/dL Final   RDW 10/14/2021 13.4  11.5 - 15.5 % Final   Platelets 10/14/2021 556 (H)  150 - 400 K/uL Final   nRBC 10/14/2021 0.0  0.0 - 0.2 % Final   Performed at [facility], [place] [address] [place], [place] [address]   Glucose-Capillary 10/13/2021 281 (H)  70 - 99 mg/dL Final   Glucose reference range applies only to samples taken after fasting for at least 8 hours.   Glucose-Capillary 10/13/2021 399 (H)  70 - 99 mg/dL Final   Glucose reference range applies only to samples taken after fasting for at least 8 hours.   Glucose-Capillary 10/14/2021 239 (H)  70 - 99 mg/dL Final   Glucose reference range applies only to samples taken after fasting for at least 8 hours.   Glucose-Capillary 10/14/2021 345 (H)  70 - 99 mg/dL Final   Glucose reference range applies only to samples taken after fasting for at least 8 hours.   Glucose-Capillary 10/14/2021 383 (H)  70 - 99 mg/dL Final   Glucose reference range applies only to samples taken after fasting for at least 8 hours.   Glucose-Capillary 10/15/2021 194 (H)  70 - 99 mg/dL Final   Glucose reference range  applies only to samples taken after fasting for at least 8 hours.   Glucose-Capillary 10/15/2021 161 (H)  70 - 99 mg/dL Final   Glucose reference range applies only to samples taken after fasting for at least 8 hours.   Glucose-Capillary 10/15/2021 312 (H)  70 - 99 mg/dL Final   Glucose reference range applies only to samples taken after fasting for at least 8 hours.   Glucose-Capillary 10/14/2021 279 (H)  70 - 99 mg/dL Final   Glucose reference range applies only to samples taken after fasting for at least 8 hours.  Office Visit on 10/08/2021  Component Date Value Ref Range Status   POC Glucose 10/08/2021 321 (A)  70 - 99 mg/dl Final   HbA1c, POC  (controlled diabetic ra* 10/08/2021 9.3 (A)  0.0 - 7.0 % Final  Admission on 10/07/2021, Discharged on 10/07/2021  Component Date Value Ref Range Status   Color, Urine 10/07/2021 YELLOW  YELLOW Final   APPearance 10/07/2021 HAZY (A)  CLEAR Final   Specific Gravity, Urine 10/07/2021 1.010  1.005 - 1.030 Final   pH 10/07/2021 5.0  5.0 - 8.0 Final   Glucose, UA 10/07/2021 >=500 (A)  NEGATIVE mg/dL Final   Hgb urine dipstick 10/07/2021 MODERATE (A)  NEGATIVE Final   Bilirubin Urine 10/07/2021 NEGATIVE  NEGATIVE Final   Ketones, ur 10/07/2021 NEGATIVE  NEGATIVE mg/dL Final   Protein, ur 10/07/2021 30 (A)  NEGATIVE mg/dL Final   Nitrite 10/07/2021 NEGATIVE  NEGATIVE Final   Leukocytes,Ua 10/07/2021 LARGE (A)  NEGATIVE Final   RBC / HPF 10/07/2021 21-50  0 - 5 RBC/hpf Final   WBC, UA 10/07/2021 >50 (H)  0 - 5 WBC/hpf Final   Bacteria, UA 10/07/2021 NONE SEEN  NONE SEEN Final   WBC Clumps 10/07/2021 PRESENT   Final   Mucus 10/07/2021 PRESENT   Final   Budding Yeast 10/07/2021 PRESENT   Final   Performed at [facility], [place] [address] [place], [place] [address]   WBC 10/07/2021 8.9  4.0 - 10.5 K/uL Final   RBC 10/07/2021 4.35  4.22 - 5.81 MIL/uL Final   Hemoglobin 10/07/2021 13.7  13.0 - 17.0 g/dL Final   HCT 10/07/2021 41.9  39.0 - 52.0 % Final   MCV 10/07/2021 96.3  80.0 - 100.0 fL Final   MCH 10/07/2021 31.5  26.0 - 34.0 pg Final   MCHC 10/07/2021 32.7  30.0 - 36.0 g/dL Final   RDW 10/07/2021 13.4  11.5 - 15.5 % Final   Platelets 10/07/2021 376  150 - 400 K/uL Final   nRBC 10/07/2021 0.0  0.0 - 0.2 % Final   Neutrophils Relative % 10/07/2021 59  % Final   Neutro Abs 10/07/2021 5.2  1.7 - 7.7 K/uL Final   Lymphocytes Relative 10/07/2021 17  % Final   Lymphs Abs 10/07/2021 1.5  0.7 - 4.0 K/uL Final   Monocytes Relative 10/07/2021 19  % Final   Monocytes Absolute 10/07/2021 1.7 (H)  0.1 - 1.0 K/uL Final   Eosinophils Relative 10/07/2021 5  % Final   Eosinophils  Absolute 10/07/2021 0.5  0.0 - 0.5 K/uL Final   Basophils Relative 10/07/2021 0  % Final   Basophils Absolute 10/07/2021 0.0  0.0 - 0.1 K/uL Final   Immature Granulocytes 10/07/2021 0  % Final   Abs Immature Granulocytes 10/07/2021 0.04  0.00 - 0.07 K/uL Final   Performed at [facility], [place] [address], [place], [place] [address]   Sodium  10/07/2021 141  135 - 145 mmol/L Final   Potassium 10/07/2021 4.0  3.5 - 5.1 mmol/L Final   Chloride 10/07/2021 107  98 - 111 mmol/L Final   CO2 10/07/2021 24  22 - 32 mmol/L Final   Glucose, Bld 10/07/2021 81  70 - 99 mg/dL Final   Glucose reference range applies only to samples taken after fasting for at least 8 hours.   BUN 10/07/2021 14  8 - 23 mg/dL Final   Creatinine, Ser 10/07/2021 1.19  0.61 - 1.24 mg/dL Final   Calcium 10/07/2021 9.4  8.9 - 10.3 mg/dL Final   Total Protein 10/07/2021 7.9  6.5 - 8.1 g/dL Final   Albumin 10/07/2021 3.5  3.5 - 5.0 g/dL Final   AST 10/07/2021 12 (L)  15 - 41 U/L Final   ALT 10/07/2021 10  0 - 44 U/L Final   Alkaline Phosphatase 10/07/2021 73  38 - 126 U/L Final   Total Bilirubin 10/07/2021 0.7  0.3 - 1.2 mg/dL Final   GFR, Estimated 10/07/2021 >60  >60 mL/min Final   Comment: (NOTE) Calculated using the CKD-EPI Creatinine Equation (2021)    Anion gap 10/07/2021 10  5 - 15 Final   Performed at [facility], [place] [address], [place], [place] [address]   Specimen Description 10/07/2021    Final                   Value:URINE, CLEAN CATCH Performed at [facility], [place] [address] [place], [place] [address]    Special Requests 10/07/2021    Final                   Value:NONE Performed at [facility], [place] [address] [place], [place] [address]    Culture 10/07/2021  (A)   Final                   Value:40,000 COLONIES/mL YEAST 20,000 COLONIES/mL LACTOBACILLUS SPECIES Standardized susceptibility testing for this organism is not  available. Performed at [facility], [place] [address] [place], [place] [address]    Report Status 10/07/2021 10/08/2021 FINAL   Final  Admission on 10/04/2021, Discharged on 10/04/2021  Component Date Value Ref Range Status   WBC 10/04/2021 9.3  4.0 - 10.5 K/uL Final   RBC 10/04/2021 4.26  4.22 - 5.81 MIL/uL Final   Hemoglobin 10/04/2021 13.4  13.0 - 17.0 g/dL Final   HCT 10/04/2021 41.5  39.0 - 52.0 % Final   MCV 10/04/2021 97.4  80.0 - 100.0 fL Final   MCH 10/04/2021 31.5  26.0 - 34.0 pg Final   MCHC 10/04/2021 32.3  30.0 - 36.0 g/dL Final   RDW 10/04/2021 13.6  11.5 - 15.5 % Final   Platelets 10/04/2021 367  150 - 400 K/uL Final   nRBC 10/04/2021 0.0  0.0 - 0.2 % Final   Performed at [facility], [place] [address] [place], [place] [address]   Sodium 10/04/2021 138  135 - 145 mmol/L Final   Potassium 10/04/2021 4.4  3.5 - 5.1 mmol/L Final   Chloride 10/04/2021 107  98 - 111 mmol/L Final   CO2 10/04/2021 22  22 - 32 mmol/L Final   Glucose, Bld 10/04/2021 236 (H)  70 - 99 mg/dL Final   Glucose reference range applies only to samples taken after fasting for at least 8 hours.   BUN 10/04/2021 13  8 - 23 mg/dL Final  Creatinine, Ser 10/04/2021 1.15  0.61 - 1.24 mg/dL Final   Calcium 10/04/2021 8.9  8.9 - 10.3 mg/dL Final   GFR, Estimated 10/04/2021 >60  >60 mL/min Final   Comment: (NOTE) Calculated using the CKD-EPI Creatinine Equation (2021)    Anion gap 10/04/2021 9  5 - 15 Final   Performed at [facility], [place] [address] [place], [place] [address]  There may be more visits with results that are not included.    Blood Alcohol level:  Lab Results  Component Value Date   ETH <10 01/20/2022   ETH <10 43/32/9518    Metabolic Disorder Labs: Lab Results  Component Value Date   HGBA1C 10.4 (H) 01/21/2022   MPG 251.78 01/21/2022   MPG >398 02/24/2021   No results found for: "PROLACTIN" Lab Results  Component Value Date   CHOL  213 (H) 05/23/2021   TRIG 115 05/23/2021   HDL 71 05/23/2021   CHOLHDL 3.2 11/03/2019   VLDL 32 (H) 05/03/2015   LDLCALC 122 (H) 05/23/2021   LDLCALC 99 11/03/2019    Therapeutic Lab Levels: No results found for: "LITHIUM" No results found for: "VALPROATE" No results found for: "CBMZ"  Physical Findings   GAD-7    Flowsheet Row Office Visit from 10/08/2021 in [facility] Office Visit from 09/05/2021 in [facility] Office Visit from 12/26/2020 in [facility] [place] Office Visit from 11/17/2018 in [facility] Office Visit from 06/05/2017 in [facility]  Total GAD-7 Score _0 0 12      PHQ2-9    [place] ED from 01/21/2022 in [facility] Office Visit from 10/08/2021 in [facility] Office Visit from 09/05/2021 in [place] [facility] ED from 07/02/2021 in [facility] Office Visit from 12/26/2020 in [facility]  PHQ-2 Total Score _1 PHQ-9 Total Score -- _2 Flowsheet Row ED from 01/21/2022 in [facility] ED from 01/20/2022 in [facility] ED from 01/18/2022 in [facility] CATEGORY Low Risk Low Risk No Risk        Musculoskeletal  Strength & Muscle Tone: within normal limits Gait & Station: normal Patient leans: Front  Psychiatric Specialty Exam  Presentation  General Appearance:  Appropriate for Environment; Casual  Eye Contact: Good  Speech: Clear and Coherent; Normal Rate  Speech Volume: Normal  Handedness: Right   Mood and Affect  Mood: Euthymic  Affect: Appropriate; Congruent   Thought Process  Thought Processes: Coherent; Goal Directed  Descriptions of  Associations:Intact  Orientation:Full (Time, Place and Person)  Thought Content:WDL; Logical  Diagnosis of Schizophrenia or Schizoaffective disorder in past: No    Hallucinations:Hallucinations: None   Ideas of Reference:None  Suicidal Thoughts:Suicidal Thoughts: No   Homicidal Thoughts:Homicidal Thoughts: No    Sensorium  Memory: Immediate Fair; Recent Fair  Judgment: Good  Insight: Good   Executive Functions  Concentration: Good  Attention Span: Good  Recall: Good  Fund of Knowledge: Good  Language: Good   Psychomotor Activity  Psychomotor Activity: Psychomotor Activity: Shuffling Gait    Assets  Assets: Communication Skills; Desire for Improvement; Resilience   Sleep  Sleep: Sleep: Good   Physical Exam  Physical Exam  HENT:     Head: Atraumatic.  Pulmonary:     Effort: Pulmonary effort is normal.  Skin:    General: Skin is warm and dry.  Neurological:     Mental Status: He is alert.    Review of Systems  Respiratory:  Negative for shortness of breath.   Cardiovascular:  Negative for chest pain.  Gastrointestinal:  Negative for abdominal pain and nausea.       Still very hungry, subjectively improving  Genitourinary:        Mild urinary incontinence  Musculoskeletal:  Positive for back pain and joint pain.  Neurological:  Negative for dizziness.   Blood pressure 136/83, pulse 95, temperature 98.3 F (36.8 C), temperature source Oral, resp. rate 18, SpO2 97 %. There is no height or weight on file to calculate BMI.  Treatment Plan Summary: Daily contact with patient to assess and evaluate symptoms and progress in treatment and Medication management  David Wyatt is a 61 y.o. male, with PMH cocaine use disorder, tobacco use disorder, housing instability, DM2 + neuropathy, HTN, HLD, no inpatient psych admission, no suicide attempts, who initially presented to [facility] (01/20/2022) for passive SI and assistance with rehab for crack  cocaine use.  Patient was then IVC by EDP, which was rescinded on 01/21/2022 by Mayra Neer, MD.  He was then admitted [facility] (01/21/[facility] via EMS for assistance with residential placement.      Cocaine use d/o  Tobacco use d/o  Action stage. Encouraged cessation Comfort PRNs    Substance induced mood d/o Continued home Cymbalta 60 mg daily  Chronic Pain - Continued gabapentin 67m TID - Continued PRN Tylenol   DM2-insulin-dependent with neuropathy, IMPROVING Urge Incontinence  BPH A1c 10.4 (01/21/2022), from 9.7 earlier this year. Goal CBGs <180. CBGs <180 x24hrs, with no hypoglycemic events.  After educating on uncontrolled DM2, patient continues to work diligently on diet, self administering insulin and foot exam. In regards to urge incontinence patient has been able to make it to bathroom every time during this stay. Urge incontinence has been improving with better glycemic control and increase in home tamsulosin.  Continued home metformin 1000 mg twice daily Continued home glipizide 10 mg with breakfast Continued home empagliflozin 10 mg with breakfast Continued home long acting insulin, self administered - 24 units daily  Continued sensitive sliding scale  Continued short acting 3 units BID for hypoglycemia per above (home dose was 5 units BID) Continued Gabapentin 600 mg TID and Cymbalta per above for neuropathy Educated on self foot exam twice daily Continued home tamsulosin 0.827mdaily (original home dose 0.88m39m  Increased appetite Hx of GERD Improving, still hungry Continue pepcid 61m57mD Ensure TID  Middle insomnia 2/2 large PO fluids near bed time.  Encouraged decreased PO fluid nearing bedtime and increased during the day Started melatonin 3 mg qHS Continued trazodone 100 mg qHS PRN  Considerations for follow-up: Recommend high risk screening every 6-12 months with HIV, RPR, hepatitis panel PCP for insulin dependent DM2 - uncontrolled [facility]   DISPO: Tentative date: 11/28-12/1 [place] [place]  [facility] to [facility] to [facility] or [facility] to needles required for insulin dependency   PGY-2 JuliMerrily Brittle 02/04/2022 4:51 PM

## 2022-02-04 NOTE — Discharge Planning (Signed)
LCSW spoke with patient on this morning regarding current barrier. Patient aware that LCSW is still awaiting date that the patient can admit. Patient expressed understanding and appreciation for LCSW assistance with this process. Patient aware that once LCSW receives an update, LCSW will inform the patient.   LCSW attempted to contact [name] from [facility] in [place] to inquire of admit date, however received no answer. LCSW left voice message requesting phone call back.   LCSW will continue to follow and provide updates as received.   Lucius Conn, LCSW Clinical Social Worker [place] [facility] Ph: [phone]

## 2022-02-04 NOTE — ED Notes (Signed)
Pt resting quietly, breathing is even and unlabored. Breakfast provided.  Pt denies SI, HI, pain and AVH. States he is not sleeping well overnight.  Pt also reports difficulty getting to restroom at nights, urinal provided to pt.  Will continue to monitor for safety.

## 2022-02-04 NOTE — Telephone Encounter (Signed)
FYI

## 2022-02-04 NOTE — ED Notes (Signed)
Pt sitting in dining room watching TV. A&O x4, calm and cooperative. Denies current SI/HI/AVH. No signs of distress noted. Monitoring for safety.

## 2022-02-04 NOTE — ED Notes (Signed)
Patient observed/assessed in room in bed appearing in no immediate distress resting peacefully. Q15 minute checks continued by MHT and nursing staff. Will continue to monitor and support. 

## 2022-02-05 DIAGNOSIS — F1420 Cocaine dependence, uncomplicated: Secondary | ICD-10-CM | POA: Diagnosis not present

## 2022-02-05 DIAGNOSIS — Z720 Tobacco use: Secondary | ICD-10-CM | POA: Diagnosis not present

## 2022-02-05 DIAGNOSIS — I10 Essential (primary) hypertension: Secondary | ICD-10-CM | POA: Diagnosis not present

## 2022-02-05 DIAGNOSIS — E785 Hyperlipidemia, unspecified: Secondary | ICD-10-CM | POA: Diagnosis not present

## 2022-02-05 LAB — GLUCOSE, CAPILLARY
Glucose-Capillary: 184 mg/dL — ABNORMAL HIGH (ref 70–99)
Glucose-Capillary: 192 mg/dL — ABNORMAL HIGH (ref 70–99)
Glucose-Capillary: 128 mg/dL — ABNORMAL HIGH (ref 70–99)
Glucose-Capillary: 165 mg/dL — ABNORMAL HIGH (ref 70–99)

## 2022-02-05 MED ORDER — LUBRIDERM SERIOUSLY SENSITIVE EX LOTN: 1.0000 | TOPICAL_LOTION | Freq: Two times a day (BID) | CUTANEOUS | 0 refills | Status: DC

## 2022-02-05 MED ORDER — MELATONIN 3 MG PO TABS
3.0000 mg | ORAL_TABLET | Freq: Every day | ORAL | Status: DC
  Administered 2022-02-05: 3 mg via ORAL
  Filled 2022-02-05: qty 1

## 2022-02-05 MED ORDER — TAMSULOSIN HCL 0.4 MG PO CAPS: 0.8000 mg | ORAL_CAPSULE | Freq: Every day | ORAL | 0 refills | Status: DC

## 2022-02-05 MED ORDER — MELATONIN 3 MG PO TABS: 3.0000 mg | ORAL_TABLET | Freq: Every day | ORAL | 0 refills | Status: DC

## 2022-02-05 MED ORDER — INSULIN ASPART 100 UNIT/ML IJ SOLN: 3.0000 [IU] | Freq: Two times a day (BID) | INTRAMUSCULAR | 0 refills | Status: DC

## 2022-02-05 MED ORDER — FAMOTIDINE 10 MG PO TABS: 10.0000 mg | ORAL_TABLET | Freq: Two times a day (BID) | ORAL | 1 refills | Status: DC

## 2022-02-05 MED ORDER — ACCU-CHEK FASTCLIX LANCETS MISC: 0 refills | Status: DC

## 2022-02-05 MED ORDER — GLUCERNA SHAKE PO LIQD: 237.0000 mL | Freq: Three times a day (TID) | ORAL | 0 refills | Status: DC

## 2022-02-05 MED ORDER — TAMSULOSIN HCL 0.4 MG PO CAPS: 0.4000 mg | ORAL_CAPSULE | Freq: Every day | ORAL | 0 refills | Status: DC

## 2022-02-05 NOTE — ED Notes (Signed)
Patient A&Ox4. Patient denies SI/I and AVH. Patient denies any physical complaints when asked. No acute distress noted. Support and encouragement provided. Routine safety checks conducted according to facility protocol. Encouraged patient to notify staff if thoughts of harm toward self or others arise. Patient verbalize understanding and agreement. Will continue to monitor for safety.

## 2022-02-05 NOTE — ED Notes (Signed)
Pt asleep in bed. Respirations even and unlabored. Monitoring for safety. 

## 2022-02-05 NOTE — ED Notes (Signed)
Pt is in the bed sleeping. Respirations are even and unlabored. No acute distress noted. Will continue to monitor for safety. 

## 2022-02-05 NOTE — Discharge Planning (Signed)
LCSW received phone call back from [facility] [place]. Per [name], patient is able to admit to their facility on tomorrow by 10:00am. Patient will need a 30 day supply of his medication. Per [name], Gabapentin medication is accepted. No other needs were reported at this time.   Patient is to arrive to Address: [address] [place], [place] [address]. Update has been provided to the patient. Patient was appreciative of LCSW assistance with his placement needs. Patient expressed appreciation for staff getting him back together physically, and teaching him about his diabetes. Patient reported some anxiety regarding next steps and what happens after the 30 day program. Brief supportive counseling was provided to the patient, and patient was encouraged to pay more attention to the journey and not the destination. Patient was receptive to the feedback provided. Patient did provide permission for LCSW to contact his sister [name] to gain collateral and to provide an update.   LCSW contacted patient's sister [name] [phone] to discuss plans for discharge and resources for support. Per Sister, she is supportive of the patient, however she reports she has had to provide him tough love as he expects her to do everything for him. Sister reports she will assist in attempting to locate housing for him once he has completed the residential program. Sister explored if there were patient advocates available to assist the patient as needed. LCSW informed Sister that LCSW will complete a search to see if such resources are available to the patient. LCSW will provide update once received. Sister was provided name and location of agency that patient will be discharging to. Sister is also aware of discharge on tomorrow. No other needs were reported at this time. Sister was appreciative of LCSW follow up. Sister to be informed once patient once patient has discharged.   Lucius Conn,  LCSW Clinical Social Worker [place] Ph: [phone]

## 2022-02-05 NOTE — ED Notes (Signed)
Patient sitting in dayroom eating lunch. Respirations equal and unlabored, skin warm and dry, NAD. No change in assessment or acuity. Q 15 minute safety checks remain in place.

## 2022-02-05 NOTE — ED Notes (Signed)
Pt requesting for his blood sugar to be checked, states he feels like it's low. CBG obtained: 184. Encouraged pt to drink water today. Monitoring for safety.

## 2022-02-05 NOTE — ED Notes (Signed)
With provider

## 2022-02-05 NOTE — ED Notes (Signed)
Patient resting quietly in bed.Respirations equal and unlabored, skin warm and dry, NAD. No change in assessment or acuity. Q 15 minute safety checks remain in place.

## 2022-02-05 NOTE — ED Provider Notes (Signed)
FBC/OBS ASAP Discharge Summary  Date and Time: 02/05/2022, 5:50 PM  Name: David Wyatt  Age: 61 y.o.  DOB: 10-01-1960  MRN:  [id number]   Discharge Diagnoses:  Final diagnoses:  Dermoid cyst of skin of back  Tobacco abuse  Diabetic polyneuropathy associated with type 2 diabetes mellitus ([place])  Cocaine use disorder, severe, dependence ([place])    HPI:  Per H&P: " Patient stated that he presented to [facility] for assistance with quitting cocaine and tobacco use.  Stated that he has been using cocaine daily for the past 3-4 years.  Prior to that patient stated his longest bout of sobriety was greater than 1 year after stay at [facility].  Reported that he smokes crack cocaine, denied IVDU.  Denied IVDU ever.  However patient was amenable to high risk workup, gave verbal consent for HIV, RPR, hepatitis panel.  Stated that cocaine does not help with anything, he does not know why he continues to use it.  Denied using it to help self-medicate for anxiety or depression.   Stated he is ready to quit tobacco as well.  Stated he smokes about 1 PPD for greater than 10 years.  He was amenable to starting medication to help with smoking cessation.   Stated that he has been homeless for years and that he is tired of it.  Stated that his goal is to find a place of his own.  Reported that he is currently on disabilities, getting about $900 a month.  David Wyatt he is on disability for his back.  Stated that he is not interested in finding a job, and that his ultimate goal is to find a place of his own and "chill".  Stated that for step to achieving that goal is to quit using substances per above.   Patient denied alcohol use, said he cannot remember the last time he use it.  Also denied other illicit stimulants, benzodiazepine, cannabis, hallucinogens.   Patient denied depressed mood, anxious mood.  Reported that his sleep is poor because of housing instability, stated that otherwise he has no difficulty  sleeping.  Does state that his energy is low.  Once starting on his home medications, patient stated that his appetite has improved.  Denied feelings of guilt or hopelessness.  Denied decreased interest.  Denied anxiety or panic attacks.  Denied nightmares, flashbacks.   Denied suicide attempt.  Denied inpatient psych admission.  Stated that he has been to residential in the past, and found them helpful.   Reported that he has no family here in [place].  Stated that his his sister and children are in [place].  Stated that he cannot live with a sister, as there is no space.  Stated that he attends church about 3 times a week.  That his other goal is to read the Bible more.   JI:RCVELFYB Thoughts: No (Last time was 11/12.  Contracted to safety.) OF:BPZWCHENI Thoughts: No DPO:EUMPNTIRWERXVQ: None (Denied AVH) Ideas of MGQ:QPYP    Mood:  (Tired) Sleep:Good Appetite: Good, much improved after restarting home diabetic medications.   Review of Systems  Constitutional:  Positive for malaise/fatigue.  Respiratory:  Negative for shortness of breath.   Cardiovascular:  Negative for chest pain.  Gastrointestinal:  Negative for abdominal pain, constipation, diarrhea, nausea and vomiting.  Genitourinary:        Polyuria  Musculoskeletal:  Positive for back pain.  Neurological:  Negative for dizziness, tremors and headaches.  "  Subjective:  Patient denied  any questions or concerns. Stated that his urgency and frequency causing him to wake up at night has improved majorly by limiting his PO fluid intake. Denied concerns or questions. Denied side effects to medications. He feels ready to go to residential.    Suicidal Thoughts: No (Contracted to safety) Homicidal Thoughts: No Hallucinations: None (Denied AVH)  Mood: Euthymic Sleep:Fair Appetite: Excessive, improving  Review of Systems  Constitutional:  Negative for malaise/fatigue.  Respiratory:  Negative for shortness of breath.    Cardiovascular:  Negative for chest pain.  Gastrointestinal:  Negative for nausea and vomiting.  Genitourinary:  Positive for frequency and urgency. Negative for dysuria.  Musculoskeletal:  Positive for back pain and joint pain.  Neurological:  Negative for dizziness, tremors and headaches.  Psychiatric/Behavioral:  The patient has insomnia.     Stay Summary:  David Wyatt is a 61 y.o. male  with PMH cocaine use disorder, tobacco use disorder, housing instability, DM2 + neuropathy, HTN, HLD, no inpatient psych admission, no suicide attempts, who initially presented to [facility] (01/20/2022) for passive SI and assistance with rehab for crack cocaine use.  Patient was then IVC by EDP, which was rescinded on 01/21/2022 by David Neer, MD.  He was then admitted Voluntary to [facility] (01/21/2022) via EMS for assistance with residential placement.      Patient was pleasant, cooperative, and engaged with treatment and therapy. No behavioral concerns, no agitation PRNs required.   Cocaine use d/o  Tobacco use d/o Action stage. Encouraged cessation Comfort PRNs  NRTs provided   Substance induced mood d/o, stable Continued home Cymbalta 60 mg daily   Chronic Pain, stable Continued gabapentin 672m TID Continued PRN Tylenol   DM2-insulin-dependent with neuropathy, IMPROVING Urge Incontinence  BPH, improving A1c 10.4 (01/21/2022), from 9.7 earlier this year. Goal CBGs <180. Presented with CBGs in 300s, near 400 due to diet and medication non-adherence. After education on diet and risks, diet modifications and medications led to CBGs 100s and few right at 200s by the time of DC.  After educating on uncontrolled DM2, patient continues to work diligently on diet, self administering insulin and foot exam. Had hypoglycemic events with 5 units of short acting insulin, which resolved with decreased to 3 units BID In regards to urge incontinence patient has been able to make it to bathroom every time during this  stay. Urge incontinence has been improving with better glycemic control and increase in home tamsulosin.  Continued home metformin 1000 mg twice daily Continued home glipizide 10 mg with breakfast Continued home empagliflozin 10 mg with breakfast Continued home long acting insulin, self administered - 24 units daily  Continued sensitive sliding scale  Continued short acting 3 units BID for hypoglycemia per above (home dose was 5 units BID) Continued Gabapentin 600 mg TID and Cymbalta per above for neuropathy Educated on self foot exam twice daily Continued home tamsulosin 0.875mdaily (original home dose 0.34m43m  Increased appetite, improving Hx of GERD Suspect 2/2 initial uncontrolled diabetes, appetite improved with glycemic control Continue pepcid 43m63mD Ensure TID   Middle insomnia, improving 2/2 urinating after large PO fluids near bed time. Improved with decreased PO fluids near bed time. Encouraged decreased PO fluid nearing bedtime and increased during the day Started melatonin 3 mg qHS Continued trazodone 100 mg qHS PRN  Considerations for follow-up: Recommend high risk screening every 6-12 months with HIV, RPR, hepatitis panel PCP for insulin dependent DM2 - uncontrolled PCP for MOCA and memory   DISPO: Tentative  date: 11/29 Location: [facility] in [place] - Accepted for disability bed  Unable to go to [facility] or [facility] due to needles required for insulin dependency  Safety planning: Patient denied access to guns or weapons. He denied SI/HI/AVH, however if he were to have those thoughts or experiences, he was aware of and would call [phone]. He would also reach out to family.     NEW medications:  famotidine 10 MG tablet; Commonly known as: PEPCID; Take 1 tablet (10 mg  total) by mouth 2 (two) times daily.  feeding supplement (GLUCERNA SHAKE) Liqd; Take 237 mLs by mouth 3  (three) times daily between meals.  insulin aspart 100 UNIT/ML injection;  Commonly known as: novoLOG; Inject  3 Units into the skin 2 (two) times daily before lunch and supper.;  Replaces: HumaLOG KwikPen 100 UNIT/ML KwikPen  lubriderm seriously sensitive Lotn; Apply 1 Application topically 2  (two) times daily.  melatonin 3 MG Tabs tablet; Take 1 tablet (3 mg total) by mouth at  bedtime.  nicotine 21 mg/24hr patch; Commonly known as: NICODERM CQ - dosed in  mg/24 hours; Place 1 patch (21 mg total) onto the skin daily at 6 (six)  AM.  traZODone 100 MG tablet; Commonly known as: DESYREL; Take 1 tablet (100  mg total) by mouth at bedtime as needed for sleep.   CHANGES in home medications  tamsulosin 0.4 MG Caps capsule; Commonly known as: FLOMAX; Take 2  capsules (0.8 mg total) by mouth daily. For renal calculi; What changed:  how much to take 5 units of short acting insulin BID to 3 units BID   CONTINUE home medications:  amLODipine 10 MG tablet; Commonly known as: NORVASC; Take 1 tablet (10  mg total) by mouth daily.  atorvastatin 40 MG tablet; Commonly known as: LIPITOR; Take 1 tablet (40  mg total) by mouth daily.  DULoxetine 60 MG capsule; Commonly known as: Cymbalta; Take 1 capsule  (60 mg total) by mouth daily.  empagliflozin 10 MG Tabs tablet; Commonly known as: Jardiance; Take 1  tablet (10 mg total) by mouth daily before breakfast.  gabapentin 300 MG capsule; Commonly known as: NEURONTIN; Take 2 capsules  (600 mg total) by mouth 3 (three) times daily.  glipiZIDE 10 MG 24 hr tablet; Commonly known as: GLUCOTROL XL; Take 1  tablet (10 mg total) by mouth daily with breakfast.  Insulin Glargine Solostar 100 UNIT/ML Solostar Pen; Commonly known as:  LANTUS; Inject 24 Units into the skin daily.  metFORMIN 1000 MG tablet; Commonly known as: GLUCOPHAGE; Take 1 tablet  (1,000 mg total) by mouth 2 (two) times daily.   DISCONTINUED medications  HumaLOG KwikPen 100 UNIT/ML KwikPen; Generic drug: insulin lispro;  Replaced by: insulin aspart 100 UNIT/ML  injection  Clinical Course as of 02/05/22 1750  Tue Jan 22, 2022  0850 TSH(!): 0.248 [JN]  0850 Hemoglobin A1C(!): 10.4 [JN]  Wed Jan 23, 2022  0758 T4,Free(Direct): 0.72 [JN]  Thu Jan 24, 2022  1420 Triiodothyronine,Free,Serum: 2.2 [JN]  Tue Jan 29, 2022  5427 Triiodothyronine,Free,Serum: 2.2 [JN]    Clinical Course User Index [JN] Merrily Brittle, DO   Patient was amenable to medications per above after extensive discussion of risk, benefits, and side effects.   While future psychiatric events cannot be accurately predicted, the patient does not currently require acute inpatient psychiatric care and does not currently meet [facility] involuntary commitment criteria.  Past Psychiatric History: Per H&P Past Medical History:  Past Medical History:  Diagnosis Date   Angioedema of lips 07/28/2012   left upper (07/29/2012)   Arthritis    hands and back   Chronic back pain    Cocaine abuse (H[facility])    Diabetic neuropathy (H[facility])    High cholesterol    Hypertension    Neuropathy    Type II diabetes mellitus ([place])     Past Surgical History:  Procedure Laterality Date   BACK SURGERY     CYSTOSCOPY W/ URETERAL STENT PLACEMENT Right 02/23/2021   Procedure: CYSTOSCOPY WITH RETROGRADE PYELOGRAM/URETERAL STENT PLACEMENT;  Surgeon: Janith Lima, MD;  Location: [facility];  Service: Urology;  Laterality: Right;   HERNIA REPAIR Right 04/01/2012   I & D EXTREMITY Left 12/13/2012   Procedure: IRRIGATION AND DEBRIDEMENT LEFT THUMB;  Surgeon: Tennis Must, MD;  Location: [place];  Service: Orthopedics;  Laterality: Left;   INCISION AND DRAINAGE OF WOUND     boil on back/notes 07/14/2008 (07/29/2012)   INGUINAL HERNIA REPAIR  04/01/2012   Procedure: HERNIA REPAIR INGUINAL ADULT;  Surgeon: Haywood Lasso, MD;  Location: [place];  Service: General;  Laterality: Right;   INGUINAL HERNIA REPAIR Left 09/06/2016   Procedure: OPEN REPAIR LEFT INGUINAL HERNIA;  Surgeon: Greer Pickerel, MD;  Location: [place];   Service: General;  Laterality: Left;   INSERTION OF MESH Left 09/06/2016   Procedure: INSERTION OF MESH;  Surgeon: Greer Pickerel, MD;  Location: [place];  Service: General;  Laterality: Left;   TONSILLECTOMY     Family History:  Family History  Problem Relation Age of Onset   Diabetes Mother    Kidney disease Mother    Hyperlipidemia Mother    Hyperlipidemia Father    Diabetes Father    Family Psychiatric History: Per H&P Social History:  Social History   Substance and Sexual Activity  Alcohol Use No   Alcohol/week: 0.0 standard drinks of alcohol     Social History   Substance and Sexual Activity  Drug Use Not Currently   Types: "Crack" cocaine    Social History   Socioeconomic History   Marital status: Divorced    Spouse name: Not on file   Number of children: 3   Years of education: 12   Highest education level: Not on file  Occupational History    Comment: disabled  Tobacco Use   Smoking status: Every Day    Packs/day: 0.25    Years: 30.00    Total pack years: 7.50    Types: Cigarettes   Smokeless tobacco: Former    Quit date: 08/15/2015   Tobacco comments:    04/29/16 2  cigs daily, 10/27/17 sometimes < .25 PPD  Vaping Use   Vaping Use: Never used  Substance and Sexual Activity   Alcohol use: No    Alcohol/week: 0.0 standard drinks of alcohol   Drug use: Not Currently    Types: "Crack" cocaine   Sexual activity: Not on file  Other Topics Concern   Not on file  Social History Narrative   04/29/17   Lives in shelter   Caffeine- a lot of  tea, coffee   Social Determinants of Health   Financial Resource Strain: Not on file  Food Insecurity: Not on file  Transportation Needs: Not on file  Physical Activity: Not on file  Stress: Not on file  Social Connections: Not on file   SDOH:  SDOH Screenings   Depression (PHQ2-9): Low Risk  (01/22/2022)  Tobacco Use: High Risk (01/23/2022)  Tobacco Cessation:  A prescription for an FDA-approved tobacco  cessation medication provided at discharge  Current Medications:  Current Facility-Administered Medications  Medication Dose Route Frequency Provider Last Rate Last Admin   acetaminophen (TYLENOL) tablet 650 mg  650 mg Oral Q6H PRN Merrily Brittle, DO   650 mg at 02/03/22 1928   alum & mag hydroxide-simeth (MAALOX/MYLANTA) 200-200-20 MG/5ML suspension 30 mL  30 mL Oral Q4H PRN Merrily Brittle, DO       amLODipine (NORVASC) tablet 10 mg  10 mg Oral Daily Merrily Brittle, DO   10 mg at 02/05/22 0936   atorvastatin (LIPITOR) tablet 40 mg  40 mg Oral Daily Merrily Brittle, DO   40 mg at 02/05/22 0936   DULoxetine (CYMBALTA) DR capsule 60 mg  60 mg Oral Daily Merrily Brittle, DO   60 mg at 02/05/22 0936   empagliflozin (JARDIANCE) tablet 10 mg  10 mg Oral QAC breakfast Merrily Brittle, DO   10 mg at 02/05/22 0750   famotidine (PEPCID) tablet 10 mg  10 mg Oral BID Cinderella, Margaret A   10 mg at 02/05/22 0936   feeding supplement (GLUCERNA SHAKE) (GLUCERNA SHAKE) liquid 237 mL  237 mL Oral TID BM Merrily Brittle, DO   237 mL at 02/05/22 1359   gabapentin (NEURONTIN) capsule 600 mg  600 mg Oral TID Cinderella, Margaret A   600 mg at 02/05/22 1355   glipiZIDE (GLUCOTROL XL) 24 hr tablet 10 mg  10 mg Oral Q breakfast Merrily Brittle, DO   10 mg at 02/05/22 0750   insulin aspart (novoLOG) injection 0-5 Units  0-5 Units Subcutaneous QHS Onuoha, Chinwendu V, NP   2 Units at 02/04/22 2115   insulin aspart (novoLOG) injection 0-6 Units  0-6 Units Subcutaneous TID WC Merrily Brittle, DO   3 Units at 02/05/22 1645   insulin aspart (novoLOG) injection 3 Units  3 Units Subcutaneous BID AC Merrily Brittle, DO   3 Units at 02/05/22 1644   insulin glargine-yfgn (SEMGLEE) injection 24 Units  24 Units Subcutaneous Daily Minda Ditto, RPH   24 Units at 02/05/22 1779   lubriderm seriously sensitive lotion 1 Application  1 Application Topical BID Cinderella, Margaret A   1 Application at 39/03/00 0944   magnesium hydroxide (MILK OF  MAGNESIA) suspension 30 mL  30 mL Oral Daily PRN Merrily Brittle, DO       melatonin tablet 3 mg  3 mg Oral QHS Merrily Brittle, DO       metFORMIN (GLUCOPHAGE) tablet 1,000 mg  1,000 mg Oral BID WC Merrily Brittle, DO   1,000 mg at 02/05/22 1648   nicotine (NICODERM CQ - dosed in mg/24 hours) patch 21 mg  21 mg Transdermal Q0600 Merrily Brittle, DO   21 mg at 02/05/22 9233   nicotine polacrilex (NICORETTE) gum 4 mg  4 mg Oral PRN Merrily Brittle, DO       tamsulosin [facility]) capsule 0.8 mg  0.8 mg Oral Daily Merrily Brittle, DO   0.8 mg at 02/05/22 0936   traZODone (DESYREL) tablet 100 mg  100 mg Oral QHS PRN Freida Busman, MD   100 mg at 02/04/22 2105   Current Outpatient Medications  Medication Sig Dispense Refill   Accu-Chek FastClix Lancets MISC Use as directed 100 each 0   Accu-Chek Softclix Lancets lancets Use up to 4 times daily as directed 100 each 0   amLODipine (NORVASC) 10 MG tablet Take 1 tablet (10 mg total) by mouth daily.  30 tablet 0   atorvastatin (LIPITOR) 40 MG tablet Take 1 tablet (40 mg total) by mouth daily. 30 tablet 0   Blood Glucose Monitoring Suppl (ACCU-CHEK GUIDE) w/Device KIT USE AS DIRECTED TO CHECK BLOOD SUGARS UP TO TWICE PER DAY 1 kit 0   DULoxetine (CYMBALTA) 60 MG capsule Take 1 capsule (60 mg total) by mouth daily. 30 capsule 0   Emollient (LUBRIDERM SERIOUSLY SENSITIVE) LOTN Apply 1 Application topically 2 (two) times daily. 473 mL 0   empagliflozin (JARDIANCE) 10 MG TABS tablet Take 1 tablet (10 mg total) by mouth daily before breakfast. 30 tablet 0   famotidine (PEPCID) 10 MG tablet Take 1 tablet (10 mg total) by mouth 2 (two) times daily. 60 tablet 1   feeding supplement, GLUCERNA SHAKE, (GLUCERNA SHAKE) LIQD Take 237 mLs by mouth 3 (three) times daily between meals. 42660 mL 0   gabapentin (NEURONTIN) 300 MG capsule Take 2 capsules (600 mg total) by mouth 3 (three) times daily. 180 capsule 0   glipiZIDE (GLUCOTROL XL) 10 MG 24 hr tablet Take 1 tablet (10 mg total)  by mouth daily with breakfast. 30 tablet 0   glucose blood (ACCU-CHEK GUIDE) test strip Use up to 3 times daily as directed 100 each 3   insulin aspart (NOVOLOG) 100 UNIT/ML injection Inject 3 Units into the skin 2 (two) times daily before lunch and supper. 10 mL 0   Insulin Glargine Solostar (LANTUS) 100 UNIT/ML Solostar Pen Inject 24 Units into the skin daily. 15 mL 2   Insulin Pen Needle 31G X 8 MM MISC Use with insulin pen 100 each 1   melatonin 3 MG TABS tablet Take 1 tablet (3 mg total) by mouth at bedtime. 30 tablet 0   metFORMIN (GLUCOPHAGE) 1000 MG tablet Take 1 tablet (1,000 mg total) by mouth 2 (two) times daily. 60 tablet 0   nicotine (NICODERM CQ - DOSED IN MG/24 HOURS) 21 mg/24hr patch Place 1 patch (21 mg total) onto the skin daily at 6 (six) AM. 28 patch 0   tamsulosin (FLOMAX) 0.4 MG CAPS capsule Take 2 capsules (0.8 mg total) by mouth daily. For renal calculi 60 capsule 0   traZODone (DESYREL) 100 MG tablet Take 1 tablet (100 mg total) by mouth at bedtime as needed for sleep. 15 tablet 0    PTA Medications: (Not in a hospital admission)     01/22/2022    1:24 PM 10/08/2021    9:04 AM 09/05/2021    9:17 AM  Depression screen PHQ 2/9  Decreased Interest 0 3 1  Down, Depressed, Hopeless _0 PHQ - 2 Score _1 Altered sleeping  3 3  Tired, decreased energy  3 2  Change in appetite  2 2  Feeling bad or failure about yourself   3 0  Trouble concentrating  3 1  Moving slowly or fidgety/restless  3 0  Suicidal thoughts  0 0  PHQ-9 Score  23 11  Difficult doing work/chores   Not difficult at all    [place] ED from 01/21/2022 in [facility] ED from 01/20/2022 in [place] ED from 01/18/2022 in [place] CATEGORY Low Risk Low Risk No Risk       Musculoskeletal  Strength & Muscle Tone: within normal limits Gait & Station: Unsteady due to  pain Patient leans: Forward  Psychiatric Specialty Exam   Presentation  General Appearance:Appropriate for Environment, Casual, Fairly Groomed Eye Contact:Good Speech:Clear and Coherent, Normal Rate Volume:Normal Handedness:Right  Mood and Affect  Mood:Euthymic Affect:Appropriate, Congruent, Full Range  Thought Process  Thought Process:Coherent, Goal Directed, Linear Descriptions of Associations:Intact  Thought Content Suicidal Thoughts:Suicidal Thoughts: No (Contracted to safety) Homicidal Thoughts:Homicidal Thoughts: No Hallucinations:Hallucinations: None (Denied AVH) Ideas of Reference:None Thought Content:Logical, WDL  Sensorium  Memory:Immediate Good Judgment:Fair Insight:Fair  Executive Functions  Orientation:Full (Time, Place and Person) Language:Good Concentration:[place] of Knowledge:Good  Psychomotor Activity  Psychomotor Activity:Psychomotor Activity: Normal  Assets  Assets:Communication Skills, Desire for Improvement, Resilience  Sleep  Quality:Fair  Physical Exam  BP 133/78 (BP Location: Right Arm)   Pulse 74   Temp 97.9 F (36.6 C) (Tympanic)   Resp 18   SpO2 99%   Physical Exam Vitals and nursing note reviewed.  Constitutional:      General: He is not in acute distress.    Appearance: He is not ill-appearing, toxic-appearing or diaphoretic.  HENT:     Head: Normocephalic.  Pulmonary:     Effort: Pulmonary effort is normal. No respiratory distress.  Neurological:     Mental Status: He is alert.     Demographic Factors:  Low socioeconomic status, Living alone, and Unemployed  Loss Factors: Financial problems/change in socioeconomic status  Historical Factors: Impulsivity  Risk Reduction Factors:   Religious beliefs about death, Positive therapeutic relationship, and Positive coping skills or problem solving skills  Continued Clinical Symptoms:  Alcohol/Substance Abuse/Dependencies Previous  Psychiatric Diagnoses and Treatments  Cognitive Features That Contribute To Risk:  Polarized thinking    Suicide Risk:  Mild:  Suicidal ideation of limited frequency, intensity, duration, and specificity.  There are no identifiable plans, no associated intent, mild dysphoria and related symptoms, good self-control (both objective and subjective assessment), few other risk factors, and identifiable protective factors, including available and accessible social support.  Plan Of Care/Follow-up recommendations:  Activity and diet at tolerated.  Please: Take all medications as prescribed by your mental healthcare provider. Report any adverse effects and or reactions from the medicines to your outpatient provider promptly. Do not engage in alcohol and or illegal drug use while on prescription medicines.    Disposition: [facility] in [place], [place] via provided taxi voucher   Total Time spent with patient: 30 minutes  Signed: Merrily Brittle, DO Psychiatry Resident, PGY-2 [facility] 02/05/2022, 5:50 PM

## 2022-02-05 NOTE — ED Notes (Signed)
Patient observed/assessed in room in bed appearing in no immediate distress resting peacefully. Q15 minute checks continued by MHT and nursing staff. Will continue to monitor and support. 

## 2022-02-05 NOTE — ED Provider Notes (Signed)
Behavioral Health Progress Note  Date and Time: 02/05/2022 5:47 PM Name: David Wyatt MRN:  [id number]  Subjective:   David Wyatt is a 61 y.o. male, with PMH cocaine use disorder, tobacco use disorder, housing instability, DM2 + neuropathy, HTN, HLD, no inpatient psych admission, no suicide attempts, who initially presented to [place] (01/20/2022) for passive SI and assistance with rehab for crack cocaine use. Patient was then IVC by EDP, which was rescinded on 01/21/2022 by Mayra Neer, MD.  He was then admitted Voluntary to [facility] (01/21/2022) via EMS for assistance with residential placement.     Patient denied any questions or concerns. Stated that his urgency and frequency causing him to wake up at night has improved majorly by limiting his PO fluid intake. Denied concerns or questions. Denied side effects to medications. He feels ready to go to residential.   Suicidal Thoughts: No (Contracted to safety) Homicidal Thoughts: No Hallucinations: None (Denied AVH)  Mood: Euthymic Sleep:Fair Appetite: Excessive, slowly normalizing, no longer irritable  Diagnosis:  Final diagnoses:  Dermoid cyst of skin of back  Tobacco abuse  Diabetic polyneuropathy associated with type 2 diabetes mellitus (HCC)  Cocaine use disorder, severe, dependence ([place])    Total Time spent with patient: 30 minutes  Past Psychiatric History: cocaine use disorder, tobacco use disorder, no Psychiatric Hospitalizations or Suicide Attempts    Family Psychiatric History: None Reported   Past Medical History:  Past Medical History:  Diagnosis Date   Angioedema of lips 07/28/2012   left upper (07/29/2012)   Arthritis    hands and back   Chronic back pain    Cocaine abuse ([place])    Diabetic neuropathy (HCC)    High cholesterol    Hypertension    Neuropathy    Type II diabetes mellitus ([place])     Past Surgical History:  Procedure Laterality Date   BACK SURGERY     CYSTOSCOPY W/ URETERAL STENT PLACEMENT Right  02/23/2021   Procedure: CYSTOSCOPY WITH RETROGRADE PYELOGRAM/URETERAL STENT PLACEMENT;  Surgeon: Janith Lima, MD;  Location: [facility];  Service: Urology;  Laterality: Right;   HERNIA REPAIR Right 04/01/2012   I & D EXTREMITY Left 12/13/2012   Procedure: IRRIGATION AND DEBRIDEMENT LEFT THUMB;  Surgeon: Tennis Must, MD;  Location: [place];  Service: Orthopedics;  Laterality: Left;   INCISION AND DRAINAGE OF WOUND     boil on back/notes 07/14/2008 (07/29/2012)   INGUINAL HERNIA REPAIR  04/01/2012   Procedure: HERNIA REPAIR INGUINAL ADULT;  Surgeon: Haywood Lasso, MD;  Location: [place];  Service: General;  Laterality: Right;   INGUINAL HERNIA REPAIR Left 09/06/2016   Procedure: OPEN REPAIR LEFT INGUINAL HERNIA;  Surgeon: Greer Pickerel, MD;  Location: [place];  Service: General;  Laterality: Left;   INSERTION OF MESH Left 09/06/2016   Procedure: INSERTION OF MESH;  Surgeon: Greer Pickerel, MD;  Location: [place];  Service: General;  Laterality: Left;   TONSILLECTOMY     Family History:  Family History  Problem Relation Age of Onset   Diabetes Mother    Kidney disease Mother    Hyperlipidemia Mother    Hyperlipidemia Father    Diabetes Father    Family Psychiatric  History: See H&P Social History:  Social History   Substance and Sexual Activity  Alcohol Use No   Alcohol/week: 0.0 standard drinks of alcohol     Social History   Substance and Sexual Activity  Drug Use Not Currently   Types: "Crack" cocaine  Social History   Socioeconomic History   Marital status: Divorced    Spouse name: Not on file   Number of children: 3   Years of education: 12   Highest education level: Not on file  Occupational History    Comment: disabled  Tobacco Use   Smoking status: Every Day    Packs/day: 0.25    Years: 30.00    Total pack years: 7.50    Types: Cigarettes   Smokeless tobacco: Former    Quit date: 08/15/2015   Tobacco comments:    04/29/16 2  cigs daily, 10/27/17 sometimes < .25 PPD   Vaping Use   Vaping Use: Never used  Substance and Sexual Activity   Alcohol use: No    Alcohol/week: 0.0 standard drinks of alcohol   Drug use: Not Currently    Types: "Crack" cocaine   Sexual activity: Not on file  Other Topics Concern   Not on file  Social History Narrative   04/29/17   Lives in shelter   Caffeine- a lot of  tea, coffee   Social Determinants of Health   Financial Resource Strain: Not on file  Food Insecurity: Not on file  Transportation Needs: Not on file  Physical Activity: Not on file  Stress: Not on file  Social Connections: Not on file   SDOH:  SDOH Screenings   Depression (PHQ2-9): Low Risk  (01/22/2022)  Tobacco Use: High Risk (01/23/2022)   Additional Social History:                          Current Medications:  Current Facility-Administered Medications  Medication Dose Route Frequency Provider Last Rate Last Admin   acetaminophen (TYLENOL) tablet 650 mg  650 mg Oral Q6H PRN Merrily Brittle, DO   650 mg at 02/03/22 1928   alum & mag hydroxide-simeth (MAALOX/MYLANTA) 200-200-20 MG/5ML suspension 30 mL  30 mL Oral Q4H PRN Merrily Brittle, DO       amLODipine (NORVASC) tablet 10 mg  10 mg Oral Daily Merrily Brittle, DO   10 mg at 02/05/22 0936   atorvastatin (LIPITOR) tablet 40 mg  40 mg Oral Daily Merrily Brittle, DO   40 mg at 02/05/22 0936   DULoxetine (CYMBALTA) DR capsule 60 mg  60 mg Oral Daily Merrily Brittle, DO   60 mg at 02/05/22 0936   empagliflozin (JARDIANCE) tablet 10 mg  10 mg Oral QAC breakfast Merrily Brittle, DO   10 mg at 02/05/22 0750   famotidine (PEPCID) tablet 10 mg  10 mg Oral BID Cinderella, Margaret A   10 mg at 02/05/22 0936   feeding supplement (GLUCERNA SHAKE) (GLUCERNA SHAKE) liquid 237 mL  237 mL Oral TID BM Merrily Brittle, DO   237 mL at 02/05/22 1359   gabapentin (NEURONTIN) capsule 600 mg  600 mg Oral TID Cinderella, Margaret A   600 mg at 02/05/22 1355   glipiZIDE (GLUCOTROL XL) 24 hr tablet 10 mg  10 mg Oral Q  breakfast Merrily Brittle, DO   10 mg at 02/05/22 0750   insulin aspart (novoLOG) injection 0-5 Units  0-5 Units Subcutaneous QHS Onu[place], Chinwendu V, NP   2 Units at 02/04/22 2115   insulin aspart (novoLOG) injection 0-6 Units  0-6 Units Subcutaneous TID WC Merrily Brittle, DO   3 Units at 02/05/22 1645   insulin aspart (novoLOG) injection 3 Units  3 Units Subcutaneous BID AC Merrily Brittle, DO   3 Units at 02/05/22  1644   insulin glargine-yfgn (SEMGLEE) injection 24 Units  24 Units Subcutaneous Daily Minda Ditto, RPH   24 Units at 02/05/22 2683   lubriderm seriously sensitive lotion 1 Application  1 Application Topical BID Cinderella, Margaret A   1 Application at 41/96/22 0944   magnesium hydroxide (MILK OF MAGNESIA) suspension 30 mL  30 mL Oral Daily PRN Merrily Brittle, DO       melatonin tablet 3 mg  3 mg Oral QHS Merrily Brittle, DO       metFORMIN (GLUCOPHAGE) tablet 1,000 mg  1,000 mg Oral BID WC Merrily Brittle, DO   1,000 mg at 02/05/22 1648   nicotine (NICODERM CQ - dosed in mg/24 hours) patch 21 mg  21 mg Transdermal Q0600 Merrily Brittle, DO   21 mg at 02/05/22 2979   nicotine polacrilex (NICORETTE) gum 4 mg  4 mg Oral PRN Merrily Brittle, DO       tamsulosin [facility]) capsule 0.8 mg  0.8 mg Oral Daily Merrily Brittle, DO   0.8 mg at 02/05/22 0936   traZODone (DESYREL) tablet 100 mg  100 mg Oral QHS PRN Freida Busman, MD   100 mg at 02/04/22 2105   Current Outpatient Medications  Medication Sig Dispense Refill   Accu-Chek FastClix Lancets MISC Use as directed 100 each 0   Accu-Chek Softclix Lancets lancets Use up to 4 times daily as directed 100 each 0   amLODipine (NORVASC) 10 MG tablet Take 1 tablet (10 mg total) by mouth daily. 30 tablet 0   atorvastatin (LIPITOR) 40 MG tablet Take 1 tablet (40 mg total) by mouth daily. 30 tablet 0   Blood Glucose Monitoring Suppl (ACCU-CHEK GUIDE) w/Device KIT USE AS DIRECTED TO CHECK BLOOD SUGARS UP TO TWICE PER DAY 1 kit 0   DULoxetine (CYMBALTA) 60 MG  capsule Take 1 capsule (60 mg total) by mouth daily. 30 capsule 0   Emollient (LUBRIDERM SERIOUSLY SENSITIVE) LOTN Apply 1 Application topically 2 (two) times daily. 473 mL 0   empagliflozin (JARDIANCE) 10 MG TABS tablet Take 1 tablet (10 mg total) by mouth daily before breakfast. 30 tablet 0   famotidine (PEPCID) 10 MG tablet Take 1 tablet (10 mg total) by mouth 2 (two) times daily. 60 tablet 1   feeding supplement, GLUCERNA SHAKE, (GLUCERNA SHAKE) LIQD Take 237 mLs by mouth 3 (three) times daily between meals. 42660 mL 0   gabapentin (NEURONTIN) 300 MG capsule Take 2 capsules (600 mg total) by mouth 3 (three) times daily. 180 capsule 0   glipiZIDE (GLUCOTROL XL) 10 MG 24 hr tablet Take 1 tablet (10 mg total) by mouth daily with breakfast. 30 tablet 0   glucose blood (ACCU-CHEK GUIDE) test strip Use up to 3 times daily as directed 100 each 3   insulin aspart (NOVOLOG) 100 UNIT/ML injection Inject 3 Units into the skin 2 (two) times daily before lunch and supper. 10 mL 0   Insulin Glargine Solostar (LANTUS) 100 UNIT/ML Solostar Pen Inject 24 Units into the skin daily. 15 mL 2   Insulin Pen Needle 31G X 8 MM MISC Use with insulin pen 100 each 1   melatonin 3 MG TABS tablet Take 1 tablet (3 mg total) by mouth at bedtime. 30 tablet 0   metFORMIN (GLUCOPHAGE) 1000 MG tablet Take 1 tablet (1,000 mg total) by mouth 2 (two) times daily. 60 tablet 0   nicotine (NICODERM CQ - DOSED IN MG/24 HOURS) 21 mg/24hr patch Place 1 patch (21 mg  total) onto the skin daily at 6 (six) AM. 28 patch 0   tamsulosin (FLOMAX) 0.4 MG CAPS capsule Take 2 capsules (0.8 mg total) by mouth daily. For renal calculi 60 capsule 0   traZODone (DESYREL) 100 MG tablet Take 1 tablet (100 mg total) by mouth at bedtime as needed for sleep. 15 tablet 0    Labs  Lab Results:  Admission on 01/21/2022  Component Date Value Ref Range Status   Hgb A1c MFr Bld 01/21/2022 10.4 (H)  4.8 - 5.6 % Final   Comment: (NOTE) Pre diabetes:           5.7%-6.4%  Diabetes:              >6.4%  Glycemic control for   <7.0% adults with diabetes    Mean Plasma Glucose 01/21/2022 251.78  mg/dL Final   Performed at [facility], [place] [address] [place], [place] [address]   TSH 01/21/2022 0.248 (L)  0.350 - 4.500 uIU/mL Final   Comment: Performed by a 3rd Generation assay with a functional sensitivity of <=0.01 uIU/mL. Performed at [facility], [place] [address] [place], [place] [address]    Glucose-Capillary 01/21/2022 374 (H)  70 - 99 mg/dL Final   Glucose reference range applies only to samples taken after fasting for at least 8 hours.   Glucose-Capillary 01/21/2022 289 (H)  70 - 99 mg/dL Final   Glucose reference range applies only to samples taken after fasting for at least 8 hours.   Glucose-Capillary 01/22/2022 137 (H)  70 - 99 mg/dL Final   Glucose reference range applies only to samples taken after fasting for at least 8 hours.   Glucose-Capillary 01/22/2022 297 (H)  70 - 99 mg/dL Final   Glucose reference range applies only to samples taken after fasting for at least 8 hours.   T3, Free 01/23/2022 2.2  2.0 - 4.4 pg/mL Final   Comment: (NOTE) Performed At: [facility] [place], [place] [id number] Rush Farmer MD YT:[id number]    Glucose-Capillary 01/22/2022 94  70 - 99 mg/dL Final   Glucose reference range applies only to samples taken after fasting for at least 8 hours.   Free T4 01/23/2022 0.72  0.61 - 1.12 ng/dL Final   Comment: (NOTE) Biotin ingestion may interfere with free T4 tests. If the results are inconsistent with the TSH level, previous test results, or the clinical presentation, then consider biotin interference. If needed, order repeat testing after stopping biotin. Performed at [facility], [place] [address] [place], [place] [address]    TSH 01/23/2022 0.203 (L)  0.350 - 4.500 uIU/mL Final   Comment: Performed by a 3rd Generation assay with a functional sensitivity of  <=0.01 uIU/mL. Performed at [facility], [place] [address], [place], [place] [address]    Glucose-Capillary 01/23/2022 132 (H)  70 - 99 mg/dL Final   Glucose reference range applies only to samples taken after fasting for at least 8 hours.   Glucose-Capillary 01/22/2022 59 (L)  70 - 99 mg/dL Final   Glucose reference range applies only to samples taken after fasting for at least 8 hours.   Glucose-Capillary 01/22/2022 177 (H)  70 - 99 mg/dL Final   Glucose reference range applies only to samples taken after fasting for at least 8 hours.   Glucose-Capillary 01/23/2022 284 (H)  70 - 99 mg/dL Final   Glucose reference range applies only to samples taken after fasting for  at least 8 hours.   Glucose-Capillary 01/23/2022 188 (H)  70 - 99 mg/dL Final   Glucose reference range applies only to samples taken after fasting for at least 8 hours.   Glucose-Capillary 01/23/2022 294 (H)  70 - 99 mg/dL Final   Glucose reference range applies only to samples taken after fasting for at least 8 hours.   Glucose-Capillary 01/24/2022 180 (H)  70 - 99 mg/dL Final   Glucose reference range applies only to samples taken after fasting for at least 8 hours.   Glucose-Capillary 01/24/2022 275 (H)  70 - 99 mg/dL Final   Glucose reference range applies only to samples taken after fasting for at least 8 hours.   Glucose-Capillary 01/24/2022 146 (H)  70 - 99 mg/dL Final   Glucose reference range applies only to samples taken after fasting for at least 8 hours.   Glucose-Capillary 01/24/2022 137 (H)  70 - 99 mg/dL Final   Glucose reference range applies only to samples taken after fasting for at least 8 hours.   Glucose-Capillary 01/25/2022 201 (H)  70 - 99 mg/dL Final   Glucose reference range applies only to samples taken after fasting for at least 8 hours.   Glucose-Capillary 01/25/2022 195 (H)  70 - 99 mg/dL Final   Glucose reference range applies only to samples taken after fasting for at least 8 hours.    Glucose-Capillary 01/25/2022 149 (H)  70 - 99 mg/dL Final   Glucose reference range applies only to samples taken after fasting for at least 8 hours.   Glucose-Capillary 01/25/2022 147 (H)  70 - 99 mg/dL Final   Glucose reference range applies only to samples taken after fasting for at least 8 hours.   Glucose-Capillary 01/26/2022 123 (H)  70 - 99 mg/dL Final   Glucose reference range applies only to samples taken after fasting for at least 8 hours.   Glucose-Capillary 01/26/2022 176 (H)  70 - 99 mg/dL Final   Glucose reference range applies only to samples taken after fasting for at least 8 hours.   Glucose-Capillary 01/26/2022 84  70 - 99 mg/dL Final   Glucose reference range applies only to samples taken after fasting for at least 8 hours.   Glucose-Capillary 01/26/2022 177 (H)  70 - 99 mg/dL Final   Glucose reference range applies only to samples taken after fasting for at least 8 hours.   Glucose-Capillary 01/27/2022 150 (H)  70 - 99 mg/dL Final   Glucose reference range applies only to samples taken after fasting for at least 8 hours.   Glucose-Capillary 01/27/2022 171 (H)  70 - 99 mg/dL Final   Glucose reference range applies only to samples taken after fasting for at least 8 hours.   Glucose-Capillary 01/27/2022 159 (H)  70 - 99 mg/dL Final   Glucose reference range applies only to samples taken after fasting for at least 8 hours.   Glucose-Capillary 01/27/2022 67 (L)  70 - 99 mg/dL Final   Glucose reference range applies only to samples taken after fasting for at least 8 hours.   Glucose-Capillary 01/27/2022 107 (H)  70 - 99 mg/dL Final   Glucose reference range applies only to samples taken after fasting for at least 8 hours.   Glucose-Capillary 01/27/2022 144 (H)  70 - 99 mg/dL Final   Glucose reference range applies only to samples taken after fasting for at least 8 hours.   Glucose-Capillary 01/28/2022 175 (H)  70 - 99 mg/dL Final   Glucose reference  range applies only to  samples taken after fasting for at least 8 hours.   Glucose-Capillary 01/28/2022 198 (H)  70 - 99 mg/dL Final   Glucose reference range applies only to samples taken after fasting for at least 8 hours.   Glucose-Capillary 01/28/2022 186 (H)  70 - 99 mg/dL Final   Glucose reference range applies only to samples taken after fasting for at least 8 hours.   Glucose-Capillary 01/28/2022 198 (H)  70 - 99 mg/dL Final   Glucose reference range applies only to samples taken after fasting for at least 8 hours.   Glucose-Capillary 01/29/2022 111 (H)  70 - 99 mg/dL Final   Glucose reference range applies only to samples taken after fasting for at least 8 hours.   Glucose-Capillary 01/29/2022 160 (H)  70 - 99 mg/dL Final   Glucose reference range applies only to samples taken after fasting for at least 8 hours.   Glucose-Capillary 01/29/2022 209 (H)  70 - 99 mg/dL Final   Glucose reference range applies only to samples taken after fasting for at least 8 hours.   Glucose-Capillary 01/29/2022 155 (H)  70 - 99 mg/dL Final   Glucose reference range applies only to samples taken after fasting for at least 8 hours.   Glucose-Capillary 01/30/2022 157 (H)  70 - 99 mg/dL Final   Glucose reference range applies only to samples taken after fasting for at least 8 hours.   Glucose-Capillary 01/30/2022 168 (H)  70 - 99 mg/dL Final   Glucose reference range applies only to samples taken after fasting for at least 8 hours.   Glucose-Capillary 01/30/2022 217 (H)  70 - 99 mg/dL Final   Glucose reference range applies only to samples taken after fasting for at least 8 hours.   Glucose-Capillary 01/30/2022 246 (H)  70 - 99 mg/dL Final   Glucose reference range applies only to samples taken after fasting for at least 8 hours.   Glucose-Capillary 01/30/2022 287 (H)  70 - 99 mg/dL Final   Glucose reference range applies only to samples taken after fasting for at least 8 hours.   Glucose-Capillary 01/31/2022 138 (H)  70 - 99  mg/dL Final   Glucose reference range applies only to samples taken after fasting for at least 8 hours.   Glucose-Capillary 01/31/2022 193 (H)  70 - 99 mg/dL Final   Glucose reference range applies only to samples taken after fasting for at least 8 hours.   Glucose-Capillary 01/31/2022 274 (H)  70 - 99 mg/dL Final   Glucose reference range applies only to samples taken after fasting for at least 8 hours.   Glucose-Capillary 01/31/2022 320 (H)  70 - 99 mg/dL Final   Glucose reference range applies only to samples taken after fasting for at least 8 hours.   Glucose-Capillary 01/31/2022 160 (H)  70 - 99 mg/dL Final   Glucose reference range applies only to samples taken after fasting for at least 8 hours.   Glucose-Capillary 02/01/2022 184 (H)  70 - 99 mg/dL Final   Glucose reference range applies only to samples taken after fasting for at least 8 hours.   Glucose-Capillary 02/01/2022 138 (H)  70 - 99 mg/dL Final   Glucose reference range applies only to samples taken after fasting for at least 8 hours.   Glucose-Capillary 02/01/2022 171 (H)  70 - 99 mg/dL Final   Glucose reference range applies only to samples taken after fasting for at least 8 hours.   Glucose-Capillary 02/01/2022 124 (  H)  70 - 99 mg/dL Final   Glucose reference range applies only to samples taken after fasting for at least 8 hours.   Glucose-Capillary 02/02/2022 112 (H)  70 - 99 mg/dL Final   Glucose reference range applies only to samples taken after fasting for at least 8 hours.   Glucose-Capillary 02/02/2022 125 (H)  70 - 99 mg/dL Final   Glucose reference range applies only to samples taken after fasting for at least 8 hours.   Glucose-Capillary 02/02/2022 274 (H)  70 - 99 mg/dL Final   Glucose reference range applies only to samples taken after fasting for at least 8 hours.   Glucose-Capillary 02/02/2022 239 (H)  70 - 99 mg/dL Final   Glucose reference range applies only to samples taken after fasting for at least 8  hours.   Glucose-Capillary 02/02/2022 190 (H)  70 - 99 mg/dL Final   Glucose reference range applies only to samples taken after fasting for at least 8 hours.   Glucose-Capillary 02/02/2022 183 (H)  70 - 99 mg/dL Final   Glucose reference range applies only to samples taken after fasting for at least 8 hours.   Glucose-Capillary 02/03/2022 113 (H)  70 - 99 mg/dL Final   Glucose reference range applies only to samples taken after fasting for at least 8 hours.   Glucose-Capillary 02/03/2022 158 (H)  70 - 99 mg/dL Final   Glucose reference range applies only to samples taken after fasting for at least 8 hours.   Glucose-Capillary 02/03/2022 100 (H)  70 - 99 mg/dL Final   Glucose reference range applies only to samples taken after fasting for at least 8 hours.   Glucose-Capillary 02/03/2022 162 (H)  70 - 99 mg/dL Final   Glucose reference range applies only to samples taken after fasting for at least 8 hours.   Glucose-Capillary 02/03/2022 114 (H)  70 - 99 mg/dL Final   Glucose reference range applies only to samples taken after fasting for at least 8 hours.   Glucose-Capillary 02/04/2022 138 (H)  70 - 99 mg/dL Final   Glucose reference range applies only to samples taken after fasting for at least 8 hours.   Glucose-Capillary 02/04/2022 157 (H)  70 - 99 mg/dL Final   Glucose reference range applies only to samples taken after fasting for at least 8 hours.   Glucose-Capillary 02/04/2022 208 (H)  70 - 99 mg/dL Final   Glucose reference range applies only to samples taken after fasting for at least 8 hours.   Glucose-Capillary 02/04/2022 212 (H)  70 - 99 mg/dL Final   Glucose reference range applies only to samples taken after fasting for at least 8 hours.   Glucose-Capillary 02/05/2022 184 (H)  70 - 99 mg/dL Final   Glucose reference range applies only to samples taken after fasting for at least 8 hours.   Glucose-Capillary 02/05/2022 192 (H)  70 - 99 mg/dL Final   Glucose reference range  applies only to samples taken after fasting for at least 8 hours.   Glucose-Capillary 02/05/2022 128 (H)  70 - 99 mg/dL Final   Glucose reference range applies only to samples taken after fasting for at least 8 hours.  Admission on 01/20/2022, Discharged on 01/21/2022  Component Date Value Ref Range Status   SARS Coronavirus 2 by RT PCR 01/20/2022 NEGATIVE  NEGATIVE Final   Comment: (NOTE) SARS-CoV-2 target nucleic acids are NOT DETECTED.  The SARS-CoV-2 RNA is generally detectable in upper respiratory specimens during the acute  phase of infection. The lowest concentration of SARS-CoV-2 viral copies this assay can detect is 138 copies/mL. A negative result does not preclude SARS-Cov-2 infection and should not be used as the sole basis for treatment or other patient management decisions. A negative result may occur with  improper specimen collection/handling, submission of specimen other than nasopharyngeal swab, presence of viral mutation(s) within the areas targeted by this assay, and inadequate number of viral copies(<138 copies/mL). A negative result must be combined with clinical observations, patient history, and epidemiological information. The expected result is Negative.  Fact Sheet for Patients:  EntrepreneurPulse.com.au  Fact Sheet for Healthcare Providers:  IncredibleEmployment.be  This test is no                          t yet approved or cleared by the [place] FDA and  has been authorized for detection and/or diagnosis of SARS-CoV-2 by FDA under an Emergency Use Authorization (EUA). This EUA will remain  in effect (meaning this test can be used) for the duration of the COVID-19 declaration under Section 564(b)(1) of the Act, 21 U.S.C.section 360bbb-3(b)(1), unless the authorization is terminated  or revoked sooner.       Influenza A by PCR 01/20/2022 NEGATIVE  NEGATIVE Final   Influenza B by PCR 01/20/2022 NEGATIVE   NEGATIVE Final   Comment: (NOTE) The Xpert Xpress SARS-CoV-2/FLU/RSV plus assay is intended as an aid in the diagnosis of influenza from Nasopharyngeal swab specimens and should not be used as a sole basis for treatment. Nasal washings and aspirates are unacceptable for Xpert Xpress SARS-CoV-2/FLU/RSV testing.  Fact Sheet for Patients: EntrepreneurPulse.com.au  Fact Sheet for Healthcare Providers: IncredibleEmployment.be  This test is not yet approved or cleared by the [place] FDA and has been authorized for detection and/or diagnosis of SARS-CoV-2 by FDA under an Emergency Use Authorization (EUA). This EUA will remain in effect (meaning this test can be used) for the duration of the COVID-19 declaration under Section 564(b)(1) of the Act, 21 U.S.C. section 360bbb-3(b)(1), unless the authorization is terminated or revoked.  Performed at [facility], [place] [address] [place], [place] [address]    Sodium 01/20/2022 136  135 - 145 mmol/L Final   Potassium 01/20/2022 4.0  3.5 - 5.1 mmol/L Final   Chloride 01/20/2022 103  98 - 111 mmol/L Final   CO2 01/20/2022 24  22 - 32 mmol/L Final   Glucose, Bld 01/20/2022 360 (H)  70 - 99 mg/dL Final   Glucose reference range applies only to samples taken after fasting for at least 8 hours.   BUN 01/20/2022 12  8 - 23 mg/dL Final   Creatinine, Ser 01/20/2022 1.01  0.61 - 1.24 mg/dL Final   Calcium 01/20/2022 9.4  8.9 - 10.3 mg/dL Final   Total Protein 01/20/2022 7.3  6.5 - 8.1 g/dL Final   Albumin 01/20/2022 3.4 (L)  3.5 - 5.0 g/dL Final   AST 01/20/2022 14 (L)  15 - 41 U/L Final   ALT 01/20/2022 10  0 - 44 U/L Final   Alkaline Phosphatase 01/20/2022 101  38 - 126 U/L Final   Total Bilirubin 01/20/2022 0.3  0.3 - 1.2 mg/dL Final   GFR, Estimated 01/20/2022 >60  >60 mL/min Final   Comment: (NOTE) Calculated using the CKD-EPI Creatinine Equation (2021)    Anion gap 01/20/2022 9  5 - 15 Final    Performed at [facility]  [address] [place], [place] [address]   Alcohol, Ethyl (B) 01/20/2022 <10  <10 mg/dL Final   Comment: (NOTE) Lowest detectable limit for serum alcohol is 10 mg/dL.  For medical purposes only. Performed at [facility], [place] [address] [place], [place] [address]    Opiates 01/20/2022 NONE DETECTED  NONE DETECTED Final   Cocaine 01/20/2022 POSITIVE (A)  NONE DETECTED Final   Benzodiazepines 01/20/2022 NONE DETECTED  NONE DETECTED Final   Amphetamines 01/20/2022 NONE DETECTED  NONE DETECTED Final   Tetrahydrocannabinol 01/20/2022 NONE DETECTED  NONE DETECTED Final   Barbiturates 01/20/2022 NONE DETECTED  NONE DETECTED Final   Comment: (NOTE) DRUG SCREEN FOR MEDICAL PURPOSES ONLY.  IF CONFIRMATION IS NEEDED FOR ANY PURPOSE, NOTIFY LAB WITHIN 5 DAYS.  LOWEST DETECTABLE LIMITS FOR URINE DRUG SCREEN Drug Class                     Cutoff (ng/mL) Amphetamine and metabolites    1000 Barbiturate and metabolites    200 Benzodiazepine                 200 Opiates and metabolites        300 Cocaine and metabolites        300 THC                            50 Performed at [facility], [place] [address] [place], [place] [address]    WBC 01/20/2022 5.2  4.0 - 10.5 K/uL Final   RBC 01/20/2022 4.73  4.22 - 5.81 MIL/uL Final   Hemoglobin 01/20/2022 14.3  13.0 - 17.0 g/dL Final   HCT 01/20/2022 43.1  39.0 - 52.0 % Final   MCV 01/20/2022 91.1  80.0 - 100.0 fL Final   MCH 01/20/2022 30.2  26.0 - 34.0 pg Final   MCHC 01/20/2022 33.2  30.0 - 36.0 g/dL Final   RDW 01/20/2022 14.1  11.5 - 15.5 % Final   Platelets 01/20/2022 444 (H)  150 - 400 K/uL Final   nRBC 01/20/2022 0.0  0.0 - 0.2 % Final   Neutrophils Relative % 01/20/2022 61  % Final   Neutro Abs 01/20/2022 3.2  1.7 - 7.7 K/uL Final   Lymphocytes Relative 01/20/2022 29  % Final   Lymphs Abs 01/20/2022 1.5  0.7 - 4.0 K/uL Final   Monocytes Relative 01/20/2022 7  % Final   Monocytes Absolute  01/20/2022 0.4  0.1 - 1.0 K/uL Final   Eosinophils Relative 01/20/2022 2  % Final   Eosinophils Absolute 01/20/2022 0.1  0.0 - 0.5 K/uL Final   Basophils Relative 01/20/2022 1  % Final   Basophils Absolute 01/20/2022 0.0  0.0 - 0.1 K/uL Final   Immature Granulocytes 01/20/2022 0  % Final   Abs Immature Granulocytes 01/20/2022 0.01  0.00 - 0.07 K/uL Final   Performed at [facility], [place] [address] [place], [place] [address]   Salicylate Lvl 41/63/8453 <7.0 (L)  7.0 - 30.0 mg/dL Final   Performed at [facility] [address], [place], [place] [address]   Acetaminophen (Tylenol), Serum 01/20/2022 <10 (L)  10 - 30 ug/mL Final   Comment: (NOTE) Therapeutic concentrations vary significantly. A range of 10-30 ug/mL  may be an effective concentration for many patients. However, some  are best treated at concentrations outside of this range. Acetaminophen concentrations >150 ug/mL at 4 hours after ingestion  and >50 ug/mL at 12 hours after ingestion are often associated with  toxic reactions.  Performed at [facility], [place] [address] [place],  [address]    Glucose-Capillary 01/20/2022 344 (H)  70 - 99 mg/dL Final   Glucose reference range applies only to samples taken after fasting for at least 8 hours.   Comment 1 01/20/2022 Notify RN   Final   Comment 2 01/20/2022 Document in Chart   Final   Glucose-Capillary 01/20/2022 479 (H)  70 - 99 mg/dL Final   Glucose reference range applies only to samples taken after fasting for at least 8 hours.   Glucose-Capillary 01/21/2022 243 (H)  70 - 99 mg/dL Final   Glucose reference range applies only to samples taken after fasting for at least 8 hours.   Glucose-Capillary 01/21/2022 224 (H)  70 - 99 mg/dL Final   Glucose reference range applies only to samples taken after fasting for at least 8 hours.   Glucose-Capillary 01/21/2022 282 (H)  70 - 99 mg/dL Final   Glucose reference range applies only to samples taken after  fasting for at least 8 hours.  Admission on 01/18/2022, Discharged on 01/19/2022  Component Date Value Ref Range Status   WBC 01/18/2022 5.3  4.0 - 10.5 K/uL Final   RBC 01/18/2022 4.47  4.22 - 5.81 MIL/uL Final   Hemoglobin 01/18/2022 13.7  13.0 - 17.0 g/dL Final   HCT 01/18/2022 41.0  39.0 - 52.0 % Final   MCV 01/18/2022 91.7  80.0 - 100.0 fL Final   MCH 01/18/2022 30.6  26.0 - 34.0 pg Final   MCHC 01/18/2022 33.4  30.0 - 36.0 g/dL Final   RDW 01/18/2022 14.2  11.5 - 15.5 % Final   Platelets 01/18/2022 445 (H)  150 - 400 K/uL Final   nRBC 01/18/2022 0.0  0.0 - 0.2 % Final   Performed at [facility], [place] [address], [place], [place] [address]   Sodium 01/18/2022 136  135 - 145 mmol/L Final   Potassium 01/18/2022 4.3  3.5 - 5.1 mmol/L Final   Chloride 01/18/2022 100  98 - 111 mmol/L Final   CO2 01/18/2022 22  22 - 32 mmol/L Final   Glucose, Bld 01/18/2022 499 (H)  70 - 99 mg/dL Final   Glucose reference range applies only to samples taken after fasting for at least 8 hours.   BUN 01/18/2022 9  8 - 23 mg/dL Final   Creatinine, Ser 01/18/2022 1.23  0.61 - 1.24 mg/dL Final   Calcium 01/18/2022 9.1  8.9 - 10.3 mg/dL Final   Total Protein 01/18/2022 6.6  6.5 - 8.1 g/dL Final   Albumin 01/18/2022 3.2 (L)  3.5 - 5.0 g/dL Final   AST 01/18/2022 18  15 - 41 U/L Final   ALT 01/18/2022 9  0 - 44 U/L Final   Alkaline Phosphatase 01/18/2022 95  38 - 126 U/L Final   Total Bilirubin 01/18/2022 0.3  0.3 - 1.2 mg/dL Final   GFR, Estimated 01/18/2022 >60  >60 mL/min Final   Comment: (NOTE) Calculated using the CKD-EPI Creatinine Equation (2021)    Anion gap 01/18/2022 14  5 - 15 Final   Performed at [facility] [address], [place], [place] [address]   Color, Urine 01/18/2022 YELLOW  YELLOW Final   APPearance 01/18/2022 CLEAR  CLEAR Final   Specific Gravity, Urine 01/18/2022 1.025  1.005 - 1.030 Final   pH 01/18/2022 6.0  5.0 -  8.0 Final   Glucose, UA 01/18/2022 >=500 (A)   NEGATIVE mg/dL Final   Hgb urine dipstick 01/18/2022 NEGATIVE  NEGATIVE Final   Bilirubin Urine 01/18/2022 NEGATIVE  NEGATIVE Final   Ketones, ur 01/18/2022 NEGATIVE  NEGATIVE mg/dL Final   Protein, ur 01/18/2022 NEGATIVE  NEGATIVE mg/dL Final   Nitrite 01/18/2022 NEGATIVE  NEGATIVE Final   Leukocytes,Ua 01/18/2022 SMALL (A)  NEGATIVE Final   RBC / HPF 01/18/2022 0-5  0 - 5 RBC/hpf Final   WBC, UA 01/18/2022 21-50  0 - 5 WBC/hpf Final   Bacteria, UA 01/18/2022 RARE (A)  NONE SEEN Final   Squamous Epithelial / LPF 01/18/2022 0-5  0 - 5 Final   Budding Yeast 01/18/2022 PRESENT   Final   Performed at [facility], [place] [address] [place], [place] [address]   Opiates 01/18/2022 NONE DETECTED  NONE DETECTED Final   Cocaine 01/18/2022 POSITIVE (A)  NONE DETECTED Final   Benzodiazepines 01/18/2022 NONE DETECTED  NONE DETECTED Final   Amphetamines 01/18/2022 NONE DETECTED  NONE DETECTED Final   Tetrahydrocannabinol 01/18/2022 NONE DETECTED  NONE DETECTED Final   Barbiturates 01/18/2022 NONE DETECTED  NONE DETECTED Final   Comment: (NOTE) DRUG SCREEN FOR MEDICAL PURPOSES ONLY.  IF CONFIRMATION IS NEEDED FOR ANY PURPOSE, NOTIFY LAB WITHIN 5 DAYS.  LOWEST DETECTABLE LIMITS FOR URINE DRUG SCREEN Drug Class                     Cutoff (ng/mL) Amphetamine and metabolites    1000 Barbiturate and metabolites    200 Benzodiazepine                 200 Opiates and metabolites        300 Cocaine and metabolites        300 THC                            50 Performed at [facility], [place] [address], [place], [place] [address]    Glucose-Capillary 01/18/2022 483 (H)  70 - 99 mg/dL Final   Glucose reference range applies only to samples taken after fasting for at least 8 hours.   Glucose-Capillary 01/19/2022 586 (HH)  70 - 99 mg/dL Final   Glucose reference range applies only to samples taken after fasting for at least 8 hours.   Glucose-Capillary 01/19/2022 456 (H)  70 - 99 mg/dL Final    Glucose reference range applies only to samples taken after fasting for at least 8 hours.   Glucose-Capillary 01/19/2022 515 (HH)  70 - 99 mg/dL Final   Glucose reference range applies only to samples taken after fasting for at least 8 hours.   Comment 1 01/19/2022 Notify RN   Final   Glucose-Capillary 01/19/2022 164 (H)  70 - 99 mg/dL Final   Glucose reference range applies only to samples taken after fasting for at least 8 hours.  Admission on 01/07/2022, Discharged on 01/07/2022  Component Date Value Ref Range Status   Glucose-Capillary 01/07/2022 267 (H)  70 - 99 mg/dL Final   Glucose reference range applies only to samples taken after fasting for at least 8 hours.  Admission on 01/02/2022, Discharged on 01/02/2022  Component Date Value Ref Range Status   WBC 01/02/2022 7.0  4.0 - 10.5 K/uL Final   RBC 01/02/2022 4.54  4.22 - 5.81 MIL/uL Final   Hemoglobin 01/02/2022 13.9  13.0 - 17.0 g/dL  Final   HCT 01/02/2022 41.4  39.0 - 52.0 % Final   MCV 01/02/2022 91.2  80.0 - 100.0 fL Final   MCH 01/02/2022 30.6  26.0 - 34.0 pg Final   MCHC 01/02/2022 33.6  30.0 - 36.0 g/dL Final   RDW 01/02/2022 14.1  11.5 - 15.5 % Final   Platelets 01/02/2022 369  150 - 400 K/uL Final   nRBC 01/02/2022 0.0  0.0 - 0.2 % Final   Neutrophils Relative % 01/02/2022 59  % Final   Neutro Abs 01/02/2022 4.1  1.7 - 7.7 K/uL Final   Lymphocytes Relative 01/02/2022 28  % Final   Lymphs Abs 01/02/2022 1.9  0.7 - 4.0 K/uL Final   Monocytes Relative 01/02/2022 8  % Final   Monocytes Absolute 01/02/2022 0.5  0.1 - 1.0 K/uL Final   Eosinophils Relative 01/02/2022 4  % Final   Eosinophils Absolute 01/02/2022 0.3  0.0 - 0.5 K/uL Final   Basophils Relative 01/02/2022 1  % Final   Basophils Absolute 01/02/2022 0.1  0.0 - 0.1 K/uL Final   Immature Granulocytes 01/02/2022 0  % Final   Abs Immature Granulocytes 01/02/2022 0.02  0.00 - 0.07 K/uL Final   Performed at [facility], [place] [address] [place], [place]  [address]   Sodium 01/02/2022 138  135 - 145 mmol/L Final   Potassium 01/02/2022 4.1  3.5 - 5.1 mmol/L Final   Chloride 01/02/2022 104  98 - 111 mmol/L Final   CO2 01/02/2022 25  22 - 32 mmol/L Final   Glucose, Bld 01/02/2022 216 (H)  70 - 99 mg/dL Final   Glucose reference range applies only to samples taken after fasting for at least 8 hours.   BUN 01/02/2022 11  8 - 23 mg/dL Final   Creatinine, Ser 01/02/2022 0.98  0.61 - 1.24 mg/dL Final   Calcium 01/02/2022 9.5  8.9 - 10.3 mg/dL Final   Total Protein 01/02/2022 7.4  6.5 - 8.1 g/dL Final   Albumin 01/02/2022 3.4 (L)  3.5 - 5.0 g/dL Final   AST 01/02/2022 12 (L)  15 - 41 U/L Final   ALT 01/02/2022 11  0 - 44 U/L Final   Alkaline Phosphatase 01/02/2022 90  38 - 126 U/L Final   Total Bilirubin 01/02/2022 0.9  0.3 - 1.2 mg/dL Final   GFR, Estimated 01/02/2022 >60  >60 mL/min Final   Comment: (NOTE) Calculated using the CKD-EPI Creatinine Equation (2021)    Anion gap 01/02/2022 9  5 - 15 Final   Performed at [facility] [address], [place], [place] [address]  [facility] on 11/01/2021  Component Date Value Ref Range Status   POC Glucose 11/27/2021 301 (A)  70 - 99 mg/dl Final   1 hr after lunch, had not taken insulin  Admission on 10/13/2021, Discharged on 10/15/2021  Component Date Value Ref Range Status   Glucose-Capillary 10/13/2021 388 (H)  70 - 99 mg/dL Final   Glucose reference range applies only to samples taken after fasting for at least 8 hours.   SARS Coronavirus 2 by RT PCR 10/13/2021 NEGATIVE  NEGATIVE Final   Comment: (NOTE) SARS-CoV-2 target nucleic acids are NOT DETECTED.  The SARS-CoV-2 RNA is generally detectable in upper respiratory specimens during the acute phase of infection. The lowest concentration of SARS-CoV-2 viral copies this assay can detect is 138 copies/mL. A negative result does not preclude SARS-Cov-2 infection and should not be used as the sole basis  for treatment  or other patient management decisions. A negative result may occur with  improper specimen collection/handling, submission of specimen other than nasopharyngeal swab, presence of viral mutation(s) within the areas targeted by this assay, and inadequate number of viral copies(<138 copies/mL). A negative result must be combined with clinical observations, patient history, and epidemiological information. The expected result is Negative.  Fact Sheet for Patients:  EntrepreneurPulse.com.au  Fact Sheet for Healthcare Providers:  IncredibleEmployment.be  This test is no                          t yet approved or cleared by the [place] FDA and  has been authorized for detection and/or diagnosis of SARS-CoV-2 by FDA under an Emergency Use Authorization (EUA). This EUA will remain  in effect (meaning this test can be used) for the duration of the COVID-19 declaration under Section 564(b)(1) of the Act, 21 U.S.C.section 360bbb-3(b)(1), unless the authorization is terminated  or revoked sooner.       Influenza A by PCR 10/13/2021 NEGATIVE  NEGATIVE Final   Influenza B by PCR 10/13/2021 NEGATIVE  NEGATIVE Final   Comment: (NOTE) The Xpert Xpress SARS-CoV-2/FLU/RSV plus assay is intended as an aid in the diagnosis of influenza from Nasopharyngeal swab specimens and should not be used as a sole basis for treatment. Nasal washings and aspirates are unacceptable for Xpert Xpress SARS-CoV-2/FLU/RSV testing.  Fact Sheet for Patients: EntrepreneurPulse.com.au  Fact Sheet for Healthcare Providers: IncredibleEmployment.be  This test is not yet approved or cleared by the [place] FDA and has been authorized for detection and/or diagnosis of SARS-CoV-2 by FDA under an Emergency Use Authorization (EUA). This EUA will remain in effect (meaning this test can be used) for the duration of the COVID-19 declaration under  Section 564(b)(1) of the Act, 21 U.S.C. section 360bbb-3(b)(1), unless the authorization is terminated or revoked.  Performed at [facility], [place] [address], [place], [place] [address]    WBC 10/13/2021 9.5  4.0 - 10.5 K/uL Final   RBC 10/13/2021 4.23  4.22 - 5.81 MIL/uL Final   Hemoglobin 10/13/2021 13.0  13.0 - 17.0 g/dL Final   HCT 10/13/2021 40.4  39.0 - 52.0 % Final   MCV 10/13/2021 95.5  80.0 - 100.0 fL Final   MCH 10/13/2021 30.7  26.0 - 34.0 pg Final   MCHC 10/13/2021 32.2  30.0 - 36.0 g/dL Final   RDW 10/13/2021 13.4  11.5 - 15.5 % Final   Platelets 10/13/2021 551 (H)  150 - 400 K/uL Final   nRBC 10/13/2021 0.0  0.0 - 0.2 % Final   Neutrophils Relative % 10/13/2021 68  % Final   Neutro Abs 10/13/2021 6.5  1.7 - 7.7 K/uL Final   Lymphocytes Relative 10/13/2021 18  % Final   Lymphs Abs 10/13/2021 1.7  0.7 - 4.0 K/uL Final   Monocytes Relative 10/13/2021 9  % Final   Monocytes Absolute 10/13/2021 0.8  0.1 - 1.0 K/uL Final   Eosinophils Relative 10/13/2021 3  % Final   Eosinophils Absolute 10/13/2021 0.3  0.0 - 0.5 K/uL Final   Basophils Relative 10/13/2021 1  % Final   Basophils Absolute 10/13/2021 0.1  0.0 - 0.1 K/uL Final   Immature Granulocytes 10/13/2021 1  % Final   Abs Immature Granulocytes 10/13/2021 0.11 (H)  0.00 - 0.07 K/uL Final   Performed at [facility], [place] [address], [place], [place] [address]  Sodium 10/13/2021 136  135 - 145 mmol/L Final   Potassium 10/13/2021 4.3  3.5 - 5.1 mmol/L Final   Chloride 10/13/2021 104  98 - 111 mmol/L Final   CO2 10/13/2021 24  22 - 32 mmol/L Final   Glucose, Bld 10/13/2021 296 (H)  70 - 99 mg/dL Final   Glucose reference range applies only to samples taken after fasting for at least 8 hours.   BUN 10/13/2021 14  8 - 23 mg/dL Final   Creatinine, Ser 10/13/2021 1.15  0.61 - 1.24 mg/dL Final   Calcium 10/13/2021 9.0  8.9 - 10.3 mg/dL Final   Total Protein 10/13/2021 7.5  6.5 - 8.1 g/dL Final   Albumin 10/13/2021  3.0 (L)  3.5 - 5.0 g/dL Final   AST 10/13/2021 13 (L)  15 - 41 U/L Final   ALT 10/13/2021 10  0 - 44 U/L Final   Alkaline Phosphatase 10/13/2021 84  38 - 126 U/L Final   Total Bilirubin 10/13/2021 0.5  0.3 - 1.2 mg/dL Final   GFR, Estimated 10/13/2021 >60  >60 mL/min Final   Comment: (NOTE) Calculated using the CKD-EPI Creatinine Equation (2021)    Anion gap 10/13/2021 8  5 - 15 Final   Performed at [facility], [place] [address], [place], [place] [address]   Color, Urine 10/13/2021 YELLOW  YELLOW Final   APPearance 10/13/2021 CLOUDY (A)  CLEAR Final   Specific Gravity, Urine 10/13/2021 1.022  1.005 - 1.030 Final   pH 10/13/2021 5.0  5.0 - 8.0 Final   Glucose, UA 10/13/2021 >=500 (A)  NEGATIVE mg/dL Final   Hgb urine dipstick 10/13/2021 LARGE (A)  NEGATIVE Final   Bilirubin Urine 10/13/2021 NEGATIVE  NEGATIVE Final   Ketones, ur 10/13/2021 NEGATIVE  NEGATIVE mg/dL Final   Protein, ur 10/13/2021 30 (A)  NEGATIVE mg/dL Final   Nitrite 10/13/2021 NEGATIVE  NEGATIVE Final   Leukocytes,Ua 10/13/2021 LARGE (A)  NEGATIVE Final   RBC / HPF 10/13/2021 >50 (H)  0 - 5 RBC/hpf Final   WBC, UA 10/13/2021 >50 (H)  0 - 5 WBC/hpf Final   Bacteria, UA 10/13/2021 RARE (A)  NONE SEEN Final   Squamous Epithelial / LPF 10/13/2021 0-5  0 - 5 Final   WBC Clumps 10/13/2021 PRESENT   Final   Mucus 10/13/2021 PRESENT   Final   Budding Yeast 10/13/2021 PRESENT   Final   Non Squamous Epithelial 10/13/2021 0-5 (A)  NONE SEEN Final   Performed at [facility], [place] [address] [place], [place] [address]   pH, Ven 10/13/2021 7.457 (H)  7.25 - 7.43 Final   pCO2, Ven 10/13/2021 38.1 (L)  44 - 60 mmHg Final   pO2, Ven 10/13/2021 174 (H)  32 - 45 mmHg Final   Bicarbonate 10/13/2021 26.9  20.0 - 28.0 mmol/L Final   TCO2 10/13/2021 28  22 - 32 mmol/L Final   O2 Saturation 10/13/2021 100  % Final   Acid-Base Excess 10/13/2021 3.0 (H)  0.0 - 2.0 mmol/L Final   Sodium 10/13/2021 137  135 - 145 mmol/L Final    Potassium 10/13/2021 4.3  3.5 - 5.1 mmol/L Final   Calcium, Ion 10/13/2021 1.10 (L)  1.15 - 1.40 mmol/L Final   HCT 10/13/2021 40.0  39.0 - 52.0 % Final   Hemoglobin 10/13/2021 13.6  13.0 - 17.0 g/dL Final   Sample type 10/13/2021 VENOUS   Final   Opiates 10/13/2021 NONE DETECTED  NONE DETECTED Final  Cocaine 10/13/2021 POSITIVE (A)  NONE DETECTED Final   Benzodiazepines 10/13/2021 NONE DETECTED  NONE DETECTED Final   Amphetamines 10/13/2021 NONE DETECTED  NONE DETECTED Final   Tetrahydrocannabinol 10/13/2021 NONE DETECTED  NONE DETECTED Final   Barbiturates 10/13/2021 NONE DETECTED  NONE DETECTED Final   Comment: (NOTE) DRUG SCREEN FOR MEDICAL PURPOSES ONLY.  IF CONFIRMATION IS NEEDED FOR ANY PURPOSE, NOTIFY LAB WITHIN 5 DAYS.  LOWEST DETECTABLE LIMITS FOR URINE DRUG SCREEN Drug Class                     Cutoff (ng/mL) Amphetamine and metabolites    1000 Barbiturate and metabolites    200 Benzodiazepine                 947 Tricyclics and metabolites     300 Opiates and metabolites        300 Cocaine and metabolites        300 THC                            50 Performed at [facility], [place] [address], [place], [place] [address]    Adenovirus 10/13/2021 NOT DETECTED  NOT DETECTED Final   Coronavirus 229E 10/13/2021 NOT DETECTED  NOT DETECTED Final   Comment: (NOTE) The Coronavirus on the Respiratory Panel, DOES NOT test for the novel  Coronavirus (2019 nCoV)    Coronavirus HKU1 10/13/2021 NOT DETECTED  NOT DETECTED Final   Coronavirus NL63 10/13/2021 NOT DETECTED  NOT DETECTED Final   Coronavirus OC43 10/13/2021 NOT DETECTED  NOT DETECTED Final   Metapneumovirus 10/13/2021 NOT DETECTED  NOT DETECTED Final   Rhinovirus / Enterovirus 10/13/2021 NOT DETECTED  NOT DETECTED Final   Influenza A 10/13/2021 NOT DETECTED  NOT DETECTED Final   Influenza B 10/13/2021 NOT DETECTED  NOT DETECTED Final   Parainfluenza Virus 1 10/13/2021 NOT DETECTED  NOT DETECTED Final    Parainfluenza Virus 2 10/13/2021 NOT DETECTED  NOT DETECTED Final   Parainfluenza Virus 3 10/13/2021 NOT DETECTED  NOT DETECTED Final   Parainfluenza Virus 4 10/13/2021 NOT DETECTED  NOT DETECTED Final   Respiratory Syncytial Virus 10/13/2021 NOT DETECTED  NOT DETECTED Final   Bordetella pertussis 10/13/2021 NOT DETECTED  NOT DETECTED Final   Bordetella Parapertussis 10/13/2021 NOT DETECTED  NOT DETECTED Final   Chlamydophila pneumoniae 10/13/2021 NOT DETECTED  NOT DETECTED Final   Mycoplasma pneumoniae 10/13/2021 NOT DETECTED  NOT DETECTED Final   Performed at [facility], [place] [address], [place], [place] [address]   Procalcitonin 10/13/2021 <0.10  ng/mL Final   Comment:        Interpretation: PCT (Procalcitonin) <= 0.5 ng/mL: Systemic infection (sepsis) is not likely. Local bacterial infection is possible. (NOTE)       Sepsis PCT Algorithm           Lower Respiratory Tract                                      Infection PCT Algorithm    ----------------------------     ----------------------------         PCT < 0.25 ng/mL                PCT < 0.10 ng/mL          Strongly encourage  Strongly discourage   discontinuation of antibiotics    initiation of antibiotics    ----------------------------     -----------------------------       PCT 0.25 - 0.50 ng/mL            PCT 0.10 - 0.25 ng/mL               OR       >80% decrease in PCT            Discourage initiation of                                            antibiotics      Encourage discontinuation           of antibiotics    ----------------------------     -----------------------------         PCT >= 0.50 ng/mL              PCT 0.26 - 0.50 ng/mL               AND                                 <80% decrease in PCT             Encourage initiation of                                             antibiotics       Encourage continuation           of antibiotics    ----------------------------      -----------------------------        PCT >= 0.50 ng/mL                  PCT > 0.50 ng/mL               AND         increase in PCT                  Strongly encourage                                      initiation of antibiotics    Strongly encourage escalation           of antibiotics                                     -----------------------------                                           PCT <= 0.25 ng/mL                                                 OR                                        >  80% decrease in PCT                                      Discontinue / Do not initiate                                             antibiotics  Performed at [facility], [place] [address], [place], [place] [address]    B Natriuretic Peptide 10/13/2021 23.6  0.0 - 100.0 pg/mL Final   Performed at [facility] [address], [place], [place] [address]   L. pneumophila Serogp 1 Ur Ag 10/13/2021 Negative  Negative Final   Comment: (NOTE) Presumptive negative for L. pneumophila serogroup 1 antigen in urine, suggesting no recent or current infection. Legionnaires' disease cannot be ruled out since other serogroups and species may also cause disease. Performed At: [facility] [place], [place] [id number] Rush Farmer MD [id number]    Source of Sample 10/13/2021 URINE, RANDOM   Final   Performed at [facility], [place] [address] [place], [place] [address]   Strep Pneumo Urinary Antigen 10/13/2021 NEGATIVE  NEGATIVE Final   Comment:        Infection due to S. pneumoniae cannot be absolutely ruled out since the antigen present may be below the detection limit of the test. Performed at [facility], [address] [place], [place] [address]    Sodium 10/14/2021 138  135 - 145 mmol/L Final   Potassium 10/14/2021 4.3  3.5 - 5.1 mmol/L Final   Chloride 10/14/2021 106  98 - 111 mmol/L Final   CO2 10/14/2021 25  22 - 32 mmol/L Final    Glucose, Bld 10/14/2021 286 (H)  70 - 99 mg/dL Final   Glucose reference range applies only to samples taken after fasting for at least 8 hours.   BUN 10/14/2021 14  8 - 23 mg/dL Final   Creatinine, Ser 10/14/2021 0.98  0.61 - 1.24 mg/dL Final   Calcium 10/14/2021 8.9  8.9 - 10.3 mg/dL Final   GFR, Estimated 10/14/2021 >60  >60 mL/min Final   Comment: (NOTE) Calculated using the CKD-EPI Creatinine Equation (2021)    Anion gap 10/14/2021 7  5 - 15 Final   Performed at [facility], [place] [address], [place], [place] [address]   WBC 10/14/2021 9.6  4.0 - 10.5 K/uL Final   RBC 10/14/2021 4.12 (L)  4.22 - 5.81 MIL/uL Final   Hemoglobin 10/14/2021 12.6 (L)  13.0 - 17.0 g/dL Final   HCT 10/14/2021 38.8 (L)  39.0 - 52.0 % Final   MCV 10/14/2021 94.2  80.0 - 100.0 fL Final   MCH 10/14/2021 30.6  26.0 - 34.0 pg Final   MCHC 10/14/2021 32.5  30.0 - 36.0 g/dL Final   RDW 10/14/2021 13.4  11.5 - 15.5 % Final   Platelets 10/14/2021 556 (H)  150 - 400 K/uL Final   nRBC 10/14/2021 0.0  0.0 - 0.2 % Final   Performed at [facility], [place] [address] [place], [place] [address]   Glucose-Capillary 10/13/2021 281 (H)  70 - 99 mg/dL Final   Glucose reference range applies only to samples taken after fasting for at least 8 hours.   Glucose-Capillary  10/13/2021 399 (H)  70 - 99 mg/dL Final   Glucose reference range applies only to samples taken after fasting for at least 8 hours.   Glucose-Capillary 10/14/2021 239 (H)  70 - 99 mg/dL Final   Glucose reference range applies only to samples taken after fasting for at least 8 hours.   Glucose-Capillary 10/14/2021 345 (H)  70 - 99 mg/dL Final   Glucose reference range applies only to samples taken after fasting for at least 8 hours.   Glucose-Capillary 10/14/2021 383 (H)  70 - 99 mg/dL Final   Glucose reference range applies only to samples taken after fasting for at least 8 hours.   Glucose-Capillary 10/15/2021 194 (H)  70 - 99 mg/dL Final   Glucose  reference range applies only to samples taken after fasting for at least 8 hours.   Glucose-Capillary 10/15/2021 161 (H)  70 - 99 mg/dL Final   Glucose reference range applies only to samples taken after fasting for at least 8 hours.   Glucose-Capillary 10/15/2021 312 (H)  70 - 99 mg/dL Final   Glucose reference range applies only to samples taken after fasting for at least 8 hours.   Glucose-Capillary 10/14/2021 279 (H)  70 - 99 mg/dL Final   Glucose reference range applies only to samples taken after fasting for at least 8 hours.  Office Visit on 10/08/2021  Component Date Value Ref Range Status   POC Glucose 10/08/2021 321 (A)  70 - 99 mg/dl Final   HbA1c, POC (controlled diabetic ra* 10/08/2021 9.3 (A)  0.0 - 7.0 % Final  Admission on 10/07/2021, Discharged on 10/07/2021  Component Date Value Ref Range Status   Color, Urine 10/07/2021 YELLOW  YELLOW Final   APPearance 10/07/2021 HAZY (A)  CLEAR Final   Specific Gravity, Urine 10/07/2021 1.010  1.005 - 1.030 Final   pH 10/07/2021 5.0  5.0 - 8.0 Final   Glucose, UA 10/07/2021 >=500 (A)  NEGATIVE mg/dL Final   Hgb urine dipstick 10/07/2021 MODERATE (A)  NEGATIVE Final   Bilirubin Urine 10/07/2021 NEGATIVE  NEGATIVE Final   Ketones, ur 10/07/2021 NEGATIVE  NEGATIVE mg/dL Final   Protein, ur 10/07/2021 30 (A)  NEGATIVE mg/dL Final   Nitrite 10/07/2021 NEGATIVE  NEGATIVE Final   Leukocytes,Ua 10/07/2021 LARGE (A)  NEGATIVE Final   RBC / HPF 10/07/2021 21-50  0 - 5 RBC/hpf Final   WBC, UA 10/07/2021 >50 (H)  0 - 5 WBC/hpf Final   Bacteria, UA 10/07/2021 NONE SEEN  NONE SEEN Final   WBC Clumps 10/07/2021 PRESENT   Final   Mucus 10/07/2021 PRESENT   Final   Budding Yeast 10/07/2021 PRESENT   Final   Performed at [facility], [place] [address], [place], [place] [address]   WBC 10/07/2021 8.9  4.0 - 10.5 K/uL Final   RBC 10/07/2021 4.35  4.22 - 5.81 MIL/uL Final   Hemoglobin 10/07/2021 13.7  13.0 - 17.0 g/dL Final   HCT  10/07/2021 41.9  39.0 - 52.0 % Final   MCV 10/07/2021 96.3  80.0 - 100.0 fL Final   MCH 10/07/2021 31.5  26.0 - 34.0 pg Final   MCHC 10/07/2021 32.7  30.0 - 36.0 g/dL Final   RDW 10/07/2021 13.4  11.5 - 15.5 % Final   Platelets 10/07/2021 376  150 - 400 K/uL Final   nRBC 10/07/2021 0.0  0.0 - 0.2 % Final   Neutrophils Relative % 10/07/2021 59  % Final   Neutro Abs 10/07/2021 5.2  1.7 - 7.7 K/uL Final   Lymphocytes Relative 10/07/2021 17  % Final   Lymphs Abs 10/07/2021 1.5  0.7 - 4.0 K/uL Final   Monocytes Relative 10/07/2021 19  % Final   Monocytes Absolute 10/07/2021 1.7 (H)  0.1 - 1.0 K/uL Final   Eosinophils Relative 10/07/2021 5  % Final   Eosinophils Absolute 10/07/2021 0.5  0.0 - 0.5 K/uL Final   Basophils Relative 10/07/2021 0  % Final   Basophils Absolute 10/07/2021 0.0  0.0 - 0.1 K/uL Final   Immature Granulocytes 10/07/2021 0  % Final   Abs Immature Granulocytes 10/07/2021 0.04  0.00 - 0.07 K/uL Final   Performed at [facility], [place] [address] [place], [place] [address]   Sodium 10/07/2021 141  135 - 145 mmol/L Final   Potassium 10/07/2021 4.0  3.5 - 5.1 mmol/L Final   Chloride 10/07/2021 107  98 - 111 mmol/L Final   CO2 10/07/2021 24  22 - 32 mmol/L Final   Glucose, Bld 10/07/2021 81  70 - 99 mg/dL Final   Glucose reference range applies only to samples taken after fasting for at least 8 hours.   BUN 10/07/2021 14  8 - 23 mg/dL Final   Creatinine, Ser 10/07/2021 1.19  0.61 - 1.24 mg/dL Final   Calcium 10/07/2021 9.4  8.9 - 10.3 mg/dL Final   Total Protein 10/07/2021 7.9  6.5 - 8.1 g/dL Final   Albumin 10/07/2021 3.5  3.5 - 5.0 g/dL Final   AST 10/07/2021 12 (L)  15 - 41 U/L Final   ALT 10/07/2021 10  0 - 44 U/L Final   Alkaline Phosphatase 10/07/2021 73  38 - 126 U/L Final   Total Bilirubin 10/07/2021 0.7  0.3 - 1.2 mg/dL Final   GFR, Estimated 10/07/2021 >60  >60 mL/min Final   Comment: (NOTE) Calculated using the CKD-EPI Creatinine Equation  (2021)    Anion gap 10/07/2021 10  5 - 15 Final   Performed at [facility], [place] [address], [place], [place] [address]   Specimen Description 10/07/2021    Final                   Value:URINE, CLEAN CATCH Performed at [facility], [place] [address], [place], [place] [address]    Special Requests 10/07/2021    Final                   Value:NONE Performed at [facility], [place] [address] [place], [place] [address]    Culture 10/07/2021  (A)   Final                   Value:40,000 COLONIES/mL YEAST 20,000 COLONIES/mL LACTOBACILLUS SPECIES Standardized susceptibility testing for this organism is not available. Performed at [facility], [place] [address], [place], [place] [address]    Report Status 10/07/2021 10/08/2021 FINAL   Final  Admission on 10/04/2021, Discharged on 10/04/2021  Component Date Value Ref Range Status   WBC 10/04/2021 9.3  4.0 - 10.5 K/uL Final   RBC 10/04/2021 4.26  4.22 - 5.81 MIL/uL Final   Hemoglobin 10/04/2021 13.4  13.0 - 17.0 g/dL Final   HCT 10/04/2021 41.5  39.0 - 52.0 % Final   MCV 10/04/2021 97.4  80.0 - 100.0 fL Final   MCH 10/04/2021 31.5  26.0 - 34.0 pg Final   MCHC 10/04/2021 32.3  30.0 - 36.0 g/dL Final   RDW  10/04/2021 13.6  11.5 - 15.5 % Final   Platelets 10/04/2021 367  150 - 400 K/uL Final   nRBC 10/04/2021 0.0  0.0 - 0.2 % Final   Performed at [facility], [place] [address] [place], [place] [address]   Sodium 10/04/2021 138  135 - 145 mmol/L Final   Potassium 10/04/2021 4.4  3.5 - 5.1 mmol/L Final   Chloride 10/04/2021 107  98 - 111 mmol/L Final   CO2 10/04/2021 22  22 - 32 mmol/L Final   Glucose, Bld 10/04/2021 236 (H)  70 - 99 mg/dL Final   Glucose reference range applies only to samples taken after fasting for at least 8 hours.   BUN 10/04/2021 13  8 - 23 mg/dL Final   Creatinine, Ser 10/04/2021 1.15  0.61 - 1.24 mg/dL Final   Calcium 10/04/2021 8.9  8.9 - 10.3  mg/dL Final   GFR, Estimated 10/04/2021 >60  >60 mL/min Final   Comment: (NOTE) Calculated using the CKD-EPI Creatinine Equation (2021)    Anion gap 10/04/2021 9  5 - 15 Final   Performed at [facility], [place] [address], [place], [place] [address]  There may be more visits with results that are not included.    Blood Alcohol level:  Lab Results  Component Value Date   ETH <10 01/20/2022   ETH <10 41/66/0630    Metabolic Disorder Labs: Lab Results  Component Value Date   HGBA1C 10.4 (H) 01/21/2022   MPG 251.78 01/21/2022   MPG >398 02/24/2021   No results found for: "PROLACTIN" Lab Results  Component Value Date   CHOL 213 (H) 05/23/2021   TRIG 115 05/23/2021   HDL 71 05/23/2021   CHOLHDL 3.2 11/03/2019   VLDL 32 (H) 05/03/2015   LDLCALC 122 (H) 05/23/2021   LDLCALC 99 11/03/2019    Therapeutic Lab Levels: No results found for: "LITHIUM" No results found for: "VALPROATE" No results found for: "CBMZ"  Physical Findings   GAD-7    Flowsheet Row Office Visit from 10/08/2021 in [place] Office Visit from 09/05/2021 in [place] [facility] Office Visit from 12/26/2020 in [place] Office Visit from 11/17/2018 in [place] Office Visit from 06/05/2017 in [place]  Total GAD-7 Score _0 0 12      PHQ2-9    [place] ED from 01/21/2022 in [facility] Office Visit from 10/08/2021 in [place] Office Visit from 09/05/2021 in [place] [facility] ED from 07/02/2021 in [facility] Office Visit from 12/26/2020 in [place]  PHQ-2 Total Score _1 PHQ-9 Total Score -- _2 Flowsheet Row ED from 01/21/2022 in [facility] ED from 01/20/2022 in [place] ED from 01/18/2022 in [place] CATEGORY Low Risk Low Risk No Risk        Musculoskeletal  Strength & Muscle Tone: within normal limits Gait & Station: normal Patient leans: Front  Psychiatric Specialty Exam  Presentation  General Appearance:  Appropriate for Environment; Casual; Fairly Groomed  Eye Contact: Good  Speech: Clear and Coherent; Normal Rate  Speech Volume: Normal  Handedness: Right   Mood and Affect  Mood: Euthymic  Affect: Appropriate; Congruent; Full Range   Thought Process  Thought Processes: Coherent; Goal Directed; Linear  Descriptions of Associations:Intact  Orientation:Full (Time, Place and Person)  Thought Content:Logical; WDL  Diagnosis of Schizophrenia or Schizoaffective disorder in past: No    Hallucinations:Hallucinations: None (Denied AVH)    Ideas of Reference:None  Suicidal Thoughts:Suicidal Thoughts: No (Contracted to safety)    Homicidal Thoughts:Homicidal Thoughts: No     Sensorium  Memory: Immediate Good  Judgment: Fair  Insight: Fair   Community education officer  Concentration: Good  Attention Span: Good  Recall: Good  Fund of Knowledge: Good  Language: Good   Psychomotor Activity  Psychomotor Activity: Psychomotor Activity: Normal     Assets  Assets: Communication Skills; Desire for Improvement; Resilience   Sleep  Sleep: Sleep: Fair    Physical Exam  Physical Exam HENT:     Head: Atraumatic.  Pulmonary:     Effort: Pulmonary effort is normal.  Skin:    General: Skin is warm and dry.  Neurological:     Mental Status: He is alert.    Review of Systems  Respiratory:  Negative for shortness of breath.   Cardiovascular:  Negative for chest pain.  Gastrointestinal:  Negative for abdominal pain and nausea.       Still very hungry, subjectively improving  Genitourinary:        Mild urinary  incontinence  Musculoskeletal:  Positive for back pain and joint pain.  Neurological:  Negative for dizziness.   Blood pressure 133/78, pulse 74, temperature 97.9 F (36.6 C), temperature source Tympanic, resp. rate 18, SpO2 99 %. There is no height or weight on file to calculate BMI.  Treatment Plan Summary: Daily contact with patient to assess and evaluate symptoms and progress in treatment and Medication management  Dontea Corlew is a 61 y.o. male, with PMH cocaine use disorder, tobacco use disorder, housing instability, DM2 + neuropathy, HTN, HLD, no inpatient psych admission, no suicide attempts, who initially presented to [place] (01/20/2022) for passive SI and assistance with rehab for crack cocaine use.  Patient was then IVC by EDP, which was rescinded on 01/21/2022 by Mayra Neer, MD.  He was then admitted Voluntary to [facility] (01/21/2022) via EMS for assistance with residential placement.      Cocaine use d/o  Tobacco use d/o  Action stage. Encouraged cessation Comfort PRNs    Substance induced mood d/o Continued home Cymbalta 60 mg daily  Chronic Pain Continued gabapentin 657m TID Continued PRN Tylenol   DM2-insulin-dependent with neuropathy, IMPROVING Urge Incontinence  BPH A1c 10.4 (01/21/2022), from 9.7 earlier this year. Goal CBGs <180. CBGs <180 x24hrs, with no hypoglycemic events.  After educating on uncontrolled DM2, patient continues to work diligently on diet, self administering insulin and foot exam. In regards to urge incontinence patient has been able to make it to bathroom every time during this stay. Urge incontinence has been improving with better glycemic control and increase in home tamsulosin.  Continued home metformin 1000 mg twice daily Continued home glipizide 10 mg with breakfast Continued home empagliflozin 10 mg with breakfast Continued home long acting insulin, self administered - 24 units daily  Continued sensitive sliding scale  Continued short acting  3 units BID for hypoglycemia per above (home dose was 5 units BID) Continued Gabapentin 600 mg TID and Cymbalta per above for neuropathy Educated on self foot exam twice daily Continued home tamsulosin 0.840mdaily (original home dose 0.23m37m  Increased appetite Hx of GERD Improving, still hungry  Continue pepcid 52m BID Ensure TID  Middle insomnia 2/2 urinating after large PO fluids near bed time. Improved with decreased PO fluids near bed time. Encouraged decreased PO fluid nearing bedtime and increased during the day Started melatonin 3 mg qHS Continued trazodone 100 mg qHS PRN  Considerations for follow-up: Recommend high risk screening every 6-12 months with HIV, RPR, hepatitis panel PCP for insulin dependent DM2 - uncontrolled [facility]   DISPO: Tentative date: 11/29 [place] [facility] [place] for [place] bed  [facility] for insulin dependency   PGY-2 JMerrily Brittle DO 02/05/2022 5:47 PM

## 2022-02-06 ENCOUNTER — Other Ambulatory Visit (HOSPITAL_COMMUNITY): Payer: Self-pay

## 2022-02-06 DIAGNOSIS — I10 Essential (primary) hypertension: Secondary | ICD-10-CM | POA: Diagnosis not present

## 2022-02-06 DIAGNOSIS — Z720 Tobacco use: Secondary | ICD-10-CM | POA: Diagnosis not present

## 2022-02-06 DIAGNOSIS — F1420 Cocaine dependence, uncomplicated: Secondary | ICD-10-CM | POA: Diagnosis not present

## 2022-02-06 DIAGNOSIS — E785 Hyperlipidemia, unspecified: Secondary | ICD-10-CM | POA: Diagnosis not present

## 2022-02-06 LAB — GLUCOSE, CAPILLARY
Glucose-Capillary: 262 mg/dL — ABNORMAL HIGH (ref 70–99)
Glucose-Capillary: 111 mg/dL — ABNORMAL HIGH (ref 70–99)

## 2022-02-06 NOTE — ED Notes (Signed)
Pt is in the bed sleeping. Respirations are even and unlabored. No acute distress noted. Will continue to monitor for safety. 

## 2022-02-06 NOTE — Discharge Planning (Signed)
LCSW spoke with patient on this morning regarding transfer on today. Patient expressed great appreciation for LCSW assistance with ensuring he received a safe discharge plan. Patient reports his plan is to go to the next facility with a positive mindset, and to stay focus on the things he has been taught here. Patient aware that contact was made with his sister who will support him as he moves forward to his next level of treatment. No other needs were reported at this time. Brief counseling provided and patient was receptive to the feedback provided.   LCSW to sign off at this time. RN to call transport. Please inform if further LCSW needs arise prior to discharge.   Lucius Conn, LCSW Clinical Social Worker [facility] Ph: [phone]

## 2022-02-13 ENCOUNTER — Telehealth: Payer: Self-pay | Admitting: Family Medicine

## 2022-02-13 DIAGNOSIS — N200 Calculus of kidney: Secondary | ICD-10-CM

## 2022-02-13 DIAGNOSIS — R310 Gross hematuria: Secondary | ICD-10-CM

## 2022-02-13 NOTE — Telephone Encounter (Signed)
Requesting referral to urology.

## 2022-02-13 NOTE — Telephone Encounter (Signed)
Referral has been placed. 

## 2022-02-13 NOTE — Telephone Encounter (Signed)
Referral Request - Has patient seen PCP for this complaint? yes *If NO, is insurance requiring patient see PCP for this issue before PCP can refer them? Referral for which specialty: urologist Preferred provider/office: ? Reason for referral: urinating on himself 3 times a day, had previous request done back in June/2023 but pt refused at that time and now wants to go thru with referral

## 2022-02-15 NOTE — Telephone Encounter (Signed)
VM left informing patient that referral has been placed.

## 2022-02-25 ENCOUNTER — Ambulatory Visit: Payer: Medicaid Other | Admitting: Orthopedic Surgery

## 2022-02-27 ENCOUNTER — Telehealth: Payer: Self-pay | Admitting: Emergency Medicine

## 2022-02-27 NOTE — Telephone Encounter (Signed)
FYI

## 2022-02-27 NOTE — Telephone Encounter (Signed)
Copied from [place] [id number]. Topic: General - Other >> Feb 27, 2022  3:36 PM Ja-Kwan M wrote: Reason for CRM: Pt requested to speak with his doctor. Pt stated he is homeless and cold and needs his doctor to call him back to discuss. Cb# [phone]

## 2022-02-28 NOTE — Telephone Encounter (Signed)
I spoke to the patient and he explained that he has been on the street for a few days and it is getting cold. He said he spoke to a representative at [facility] yesterday and they were not able to help him.  I suggested that he go to the [facility].  They are providing shelter at their office when the temperatures are freezing. I also explained that they have a case manager who can assist with securing temporary housing if it is available. He said he knows where the [facility] is and I strongly encouraged him to go.   I asked if he had contacted [facility] and he said no.  He requested that I text their  phone number to him which I did

## 2022-03-01 ENCOUNTER — Encounter (HOSPITAL_COMMUNITY): Payer: Self-pay

## 2022-03-01 ENCOUNTER — Other Ambulatory Visit: Payer: Self-pay

## 2022-03-01 ENCOUNTER — Emergency Department (HOSPITAL_COMMUNITY): Payer: Medicaid Other

## 2022-03-01 ENCOUNTER — Emergency Department (HOSPITAL_COMMUNITY)
Admission: EM | Admit: 2022-03-01 | Discharge: 2022-03-01 | Disposition: A | Discharge status: Home / Self Care | Payer: Medicaid Other | Attending: Emergency Medicine | Admitting: Emergency Medicine

## 2022-03-01 ENCOUNTER — Emergency Department (HOSPITAL_COMMUNITY)
Admission: EM | Admit: 2022-03-01 | Discharge: 2022-03-02 | Disposition: A | Discharge status: Other Acute Inpatient Hospital | Payer: Medicaid Other | Source: Home / Self Care | Attending: Emergency Medicine | Admitting: Emergency Medicine

## 2022-03-01 DIAGNOSIS — G8929 Other chronic pain: Secondary | ICD-10-CM | POA: Insufficient documentation

## 2022-03-01 DIAGNOSIS — Z7984 Long term (current) use of oral hypoglycemic drugs: Secondary | ICD-10-CM | POA: Diagnosis not present

## 2022-03-01 DIAGNOSIS — Z794 Long term (current) use of insulin: Secondary | ICD-10-CM | POA: Insufficient documentation

## 2022-03-01 DIAGNOSIS — F1490 Cocaine use, unspecified, uncomplicated: Secondary | ICD-10-CM | POA: Insufficient documentation

## 2022-03-01 DIAGNOSIS — Z1152 Encounter for screening for COVID-19: Secondary | ICD-10-CM | POA: Insufficient documentation

## 2022-03-01 DIAGNOSIS — E1165 Type 2 diabetes mellitus with hyperglycemia: Secondary | ICD-10-CM

## 2022-03-01 DIAGNOSIS — I10 Essential (primary) hypertension: Secondary | ICD-10-CM | POA: Insufficient documentation

## 2022-03-01 DIAGNOSIS — M5441 Lumbago with sciatica, right side: Secondary | ICD-10-CM | POA: Insufficient documentation

## 2022-03-01 DIAGNOSIS — E119 Type 2 diabetes mellitus without complications: Secondary | ICD-10-CM | POA: Diagnosis not present

## 2022-03-01 DIAGNOSIS — M549 Dorsalgia, unspecified: Secondary | ICD-10-CM | POA: Diagnosis present

## 2022-03-01 DIAGNOSIS — Z20822 Contact with and (suspected) exposure to covid-19: Secondary | ICD-10-CM | POA: Insufficient documentation

## 2022-03-01 LAB — CBG MONITORING, ED: Glucose-Capillary: 289 mg/dL — ABNORMAL HIGH (ref 70–99)

## 2022-03-01 MED ORDER — ACETAMINOPHEN 325 MG PO TABS
325.0000 mg | ORAL_TABLET | Freq: Once | ORAL | Status: AC
  Administered 2022-03-01: 325 mg via ORAL
  Filled 2022-03-01: qty 1

## 2022-03-01 NOTE — ED Provider Triage Note (Signed)
Emergency Medicine Provider Triage Evaluation Note  David Wyatt , a 61 y.o. male  was evaluated in triage.  Pt states he is back here to get help finding rehab for crack cocaine.  He states he has been clean for a month, however, due to depression and unstable housing, he is worried about relapse.  He states he has been in a rehab center but was not able to finish so he would like to go into a longer program.  Denies SI/HI.  Review of Systems  Positive: As above Negative: As above  Physical Exam  BP (!) 149/75 (BP Location: Left Arm)   Pulse 83   Temp 98.3 F (36.8 C) (Oral)   Resp 16   Ht '5\' 8"'$  (1.727 m)   Wt 74.8 kg   SpO2 94%   BMI 25.09 kg/m  Gen:   Awake, no distress   Resp:  Normal effort  MSK:   Moves extremities without difficulty  Other:    Medical Decision Making  Medically screening exam initiated at 5:51 PM.  Appropriate orders placed.  [name] was informed that the remainder of the evaluation will be completed by another provider, this initial triage assessment does not replace that evaluation, and the importance of remaining in the ED until their evaluation is complete.     Theressa Stamps R, [place] 03/01/22 1753

## 2022-03-01 NOTE — ED Triage Notes (Signed)
Pt to ED via [facility]. Pt states he had back surgery and his back has been hurting. Pt c/o right arm and right leg numbness "for awhile".  Pt states he has been incontinent of urine.   EMS VS 210/116 Cbg=479

## 2022-03-01 NOTE — ED Triage Notes (Signed)
Pt states he is here to get help detoxing from crack and would like to be sent to rehab. Pt states he has been in a rehab center but was not able to finish so he would like to go into a longer program.

## 2022-03-01 NOTE — ED Provider Triage Note (Signed)
  Emergency Medicine Provider Triage Evaluation Note  MRN:  [id number]  Arrival date & time: 03/01/22    Medically screening exam initiated at 12:22 AM.   CC:   Back Pain   HPI:  David Wyatt is a 61 y.o. year-old male presents to the ED with chief complaint of acute on chronic low back pain.  States that he has back pain anytime it gets cold.  He states that he radiates down his left leg.  It is not different from prior episodes.  History provided by patient. ROS:  -As included in HPI PE:   Vitals:   03/01/22 0004  BP: (!) 151/95  Pulse: 90  Resp: 17  Temp: 98.6 F (37 C)  SpO2: 98%    Non-toxic appearing No respiratory distress  MDM:  Based on signs and symptoms, chronic pain exacerbated by cold weather is highest on my differential.   Patient was informed that the remainder of the evaluation will be completed by another provider, this initial triage assessment does not replace that evaluation, and the importance of remaining in the ED until their evaluation is complete.    Montine Circle, PA-C 03/01/22 0023

## 2022-03-01 NOTE — ED Provider Notes (Signed)
[name]   CSN: [id number] Arrival date & time: 03/01/22  0002     History  Chief Complaint  Patient presents with   Back Pain    David Wyatt is a 61 y.o. male s/p back surgery, h/o DM, who presented with 3d h/o back pain. Pain is 9/10 sharp radiating down R leg. Patient endorsed urinary incontinence but that has been present for years. Patient also endorsed insulin injection for DM management. Patient denied any alleviating factors but says standing or movement irritates his back. Patient denied any CP, HA, abd pain, fever.    Home Medications Prior to Admission medications   Medication Sig Start Date End Date Taking? Authorizing Provider  Accu-Chek FastClix Lancets MISC Use as directed 02/05/22 03/07/22  Merrily Brittle, DO  Accu-Chek Softclix Lancets lancets Use up to 4 times daily as directed 01/23/22   Merrily Brittle, DO  amLODipine (NORVASC) 10 MG tablet Take 1 tablet (10 mg total) by mouth daily. 01/23/22 02/22/22  Merrily Brittle, DO  atorvastatin (LIPITOR) 40 MG tablet Take 1 tablet (40 mg total) by mouth daily. 01/23/22 02/22/22  Merrily Brittle, DO  Blood Glucose Monitoring Suppl (ACCU-CHEK GUIDE) w/Device KIT USE AS DIRECTED TO CHECK BLOOD SUGARS UP TO TWICE PER DAY 01/23/22   Merrily Brittle, DO  DULoxetine (CYMBALTA) 60 MG capsule Take 1 capsule (60 mg total) by mouth daily. 01/23/22 02/22/22  Merrily Brittle, DO  Emollient (LUBRIDERM SERIOUSLY SENSITIVE) LOTN Apply 1 Application topically 2 (two) times daily. 02/05/22   Merrily Brittle, DO  famotidine (PEPCID) 10 MG tablet Take 1 tablet (10 mg total) by mouth 2 (two) times daily. 02/05/22 04/06/22  Merrily Brittle, DO  feeding supplement, GLUCERNA SHAKE, (GLUCERNA SHAKE) LIQD Take 237 mLs by mouth 3 (three) times daily between meals. 02/05/22 04/06/22  Merrily Brittle, DO  gabapentin (NEURONTIN) 300 MG capsule Take 2 capsules (600 mg total) by mouth 3 (three) times daily. 01/23/22  02/22/22  Merrily Brittle, DO  glipiZIDE (GLUCOTROL XL) 10 MG 24 hr tablet Take 1 tablet (10 mg total) by mouth daily with breakfast. 01/23/22 02/22/22  Merrily Brittle, DO  glucose blood (ACCU-CHEK GUIDE) test strip Use up to 3 times daily as directed 01/23/22   Merrily Brittle, DO  insulin aspart (NOVOLOG) 100 UNIT/ML injection Inject 3 Units into the skin 2 (two) times daily before lunch and supper. 02/05/22 07/22/22  Merrily Brittle, DO  Insulin Glargine Solostar (LANTUS) 100 UNIT/ML Solostar Pen Inject 24 Units into the skin daily. 01/23/22   Merrily Brittle, DO  Insulin Pen Needle 31G X 8 MM MISC Use with insulin pen 01/23/22   Merrily Brittle, DO  melatonin 3 MG TABS tablet Take 1 tablet (3 mg total) by mouth at bedtime. 02/05/22 03/07/22  Merrily Brittle, DO  metFORMIN (GLUCOPHAGE) 1000 MG tablet Take 1 tablet (1,000 mg total) by mouth 2 (two) times daily. 01/23/22 02/22/22  Merrily Brittle, DO  nicotine (NICODERM CQ - DOSED IN MG/24 HOURS) 21 mg/24hr patch Place 1 patch (21 mg total) onto the skin daily at 6 (six) AM. 02/04/22   [name], [name], MD  tamsulosin (FLOMAX) 0.4 MG CAPS capsule Take 2 capsules (0.8 mg total) by mouth daily. For renal calculi 02/05/22 03/07/22  Merrily Brittle, DO  traZODone (DESYREL) 100 MG tablet Take 1 tablet (100 mg total) by mouth at bedtime as needed for sleep. 02/03/22   Briant Cedar, MD  loratadine (CLARITIN) 10 MG tablet Take 1 tablet (10 mg total)  by mouth daily. Patient not taking: No sig reported 06/03/17 07/20/20  [name], MD      Allergies    Lisinopril    Review of Systems   Review of Systems  Constitutional:  Negative for chills and fever.  HENT:  Negative for sore throat.   Eyes:  Negative for visual disturbance.  Respiratory:  Negative for shortness of breath.   Cardiovascular:  Negative for chest pain.  Gastrointestinal:  Negative for abdominal pain, constipation, diarrhea, nausea and vomiting.  Genitourinary:        See HPI   Musculoskeletal:  Positive for back pain. Negative for neck pain.  Skin:  Negative for rash.  Neurological:  Positive for weakness. Negative for syncope, numbness and headaches.  Psychiatric/Behavioral:  Negative for behavioral problems.     Physical Exam Updated Vital Signs BP (!) 149/95   Pulse 75   Temp 98.2 F (36.8 C) (Oral)   Resp 20   Ht _0  (1.727 m)   Wt 74.8 kg   SpO2 98%   BMI 25.09 kg/m  Physical Exam Constitutional:      General: He is not in acute distress. HENT:     Head: Normocephalic.  Eyes:     Extraocular Movements: Extraocular movements intact.     Conjunctiva/sclera: Conjunctivae normal.  Cardiovascular:     Rate and Rhythm: Normal rate and regular rhythm.     Pulses: Normal pulses.     Heart sounds: Normal heart sounds.     Comments: 2+ bilateral PT Pulmonary:     Effort: Pulmonary effort is normal.     Breath sounds: Normal breath sounds.  Abdominal:     Palpations: Abdomen is soft.     Tenderness: There is no abdominal tenderness. There is no guarding.     Comments: No pulsatile mass  Musculoskeletal:     Cervical back: Normal range of motion.     Comments: TTP R lower back Stiff back rotation, flexion/extension 5/5 bilateral hip extension/flexion, dorsiflexion/plantar flexion  Skin:    General: Skin is warm and dry.  Neurological:     General: No focal deficit present.     Mental Status: He is alert and oriented to person, place, and time.     Comments: Sensation intact distally  Psychiatric:        Mood and Affect: Mood normal.     ED Results / Procedures / Treatments   Labs (all labs ordered are listed, but only abnormal results are displayed) Labs Reviewed  CBG MONITORING, ED - Abnormal; Notable for the following components:      Result Value   Glucose-Capillary 289 (*)    All other components within normal limits    EKG None  Radiology DG Lumbar Spine Complete  Result Date: 03/01/2022 CLINICAL DATA:  Back pain  EXAM: LUMBAR SPINE - COMPLETE 4+ VIEW COMPARISON:  CT 08/11/2021. FINDINGS: There is no evidence of lumbar spine fracture. Prior anterior posterior fusion at L4-L5. Intact hardware without evidence of loosening or fracture. There is trace residual anterolisthesis at L4-L5. There is moderate degenerative disc disease at L5-S1 and mild degenerative disc disease above the fusion. Advanced facet arthropathy at L3-L4 and moderate facet arthropathy at L5-S1 there is a right-sided nephroureteral stent in place. IMPRESSION: No evidence of lumbar spine fracture. Prior L4-L5 lumbar fusion without evidence of hardware complication. Moderate degenerative disc disease at L5-S1 and mild degenerative disc disease above the fusion. Advanced facet arthropathy at L3-L4. Electronically Signed   By:  Maurine Simmering M.D.   On: 03/01/2022 13:02    Procedures Procedures    Medications Ordered in ED Medications  acetaminophen (TYLENOL) tablet 325 mg (has no administration in time range)    ED Course/ Medical Decision Making/ A&P                           Medical Decision Making Khayden Herzberg 61 y.o. presented today for 3d h/o back pain. Some life threatening, surgical, emergent diagnoses I considered were cauda equina, AAA, epidural abscess, osteomyelitis.  Review of prior external notes: 03/01/22 ED Triage  Unique Tests and Interpretation: - XR Lumbar:  Discussion with Independent Historian: none  Discussion of Management of Tests: none  Risk:   Medium:  - prescription drug management  Risk Stratification Score: none  Staffed with Dr.Steinl  Plan: Based on patient HPI, physical exam, and lab findings, the most likely diagnosis is degenerative disc disease. DDx included disc herniation, back strain, sacroiliitis, sciatica, vertebral fracture. XR lumbar was ordered and showed degenerative disc disease in the lumbar region. The best course of action would be pain management and discharge once the patient can  safely go. Patient was given tylenol for pain management while in ED. Patient was educated that if symptoms worsen, to return to ED. Patient verbalized understanding of plan.  Amount and/or Complexity of Data Reviewed Radiology: ordered.   Final Clinical Impression(s) / ED Diagnoses Final diagnoses:  Chronic right-sided low back pain with right-sided sciatica    Rx / DC Orders ED Discharge Orders     None         Elvina Sidle 03/01/22 1343    Lajean Saver, MD 03/06/22 [phone]

## 2022-03-02 ENCOUNTER — Other Ambulatory Visit (HOSPITAL_COMMUNITY)
Admission: EM | Admit: 2022-03-02 | Discharge: 2022-03-08 | Disposition: A | Discharge status: Home / Self Care | Payer: Medicaid Other | Attending: Psychiatry | Admitting: Psychiatry

## 2022-03-02 DIAGNOSIS — F1410 Cocaine abuse, uncomplicated: Secondary | ICD-10-CM | POA: Diagnosis present

## 2022-03-02 DIAGNOSIS — Z79899 Other long term (current) drug therapy: Secondary | ICD-10-CM | POA: Insufficient documentation

## 2022-03-02 DIAGNOSIS — E1165 Type 2 diabetes mellitus with hyperglycemia: Secondary | ICD-10-CM

## 2022-03-02 DIAGNOSIS — F1420 Cocaine dependence, uncomplicated: Secondary | ICD-10-CM | POA: Insufficient documentation

## 2022-03-02 DIAGNOSIS — M5126 Other intervertebral disc displacement, lumbar region: Secondary | ICD-10-CM

## 2022-03-02 DIAGNOSIS — Z794 Long term (current) use of insulin: Secondary | ICD-10-CM | POA: Insufficient documentation

## 2022-03-02 DIAGNOSIS — F1910 Other psychoactive substance abuse, uncomplicated: Secondary | ICD-10-CM

## 2022-03-02 DIAGNOSIS — E1149 Type 2 diabetes mellitus with other diabetic neurological complication: Secondary | ICD-10-CM

## 2022-03-02 DIAGNOSIS — I10 Essential (primary) hypertension: Secondary | ICD-10-CM

## 2022-03-02 DIAGNOSIS — N200 Calculus of kidney: Secondary | ICD-10-CM

## 2022-03-02 DIAGNOSIS — Z7984 Long term (current) use of oral hypoglycemic drugs: Secondary | ICD-10-CM | POA: Insufficient documentation

## 2022-03-02 LAB — COMPREHENSIVE METABOLIC PANEL
ALT: 22 U/L (ref 0–44)
AST: 35 U/L (ref 15–41)
Albumin: 3 g/dL — ABNORMAL LOW (ref 3.5–5.0)
Alkaline Phosphatase: 92 U/L (ref 38–126)
Anion gap: 10 (ref 5–15)
BUN: 16 mg/dL (ref 8–23)
CO2: 24 mmol/L (ref 22–32)
Calcium: 8.7 mg/dL — ABNORMAL LOW (ref 8.9–10.3)
Chloride: 96 mmol/L — ABNORMAL LOW (ref 98–111)
Creatinine, Ser: 1.36 mg/dL — ABNORMAL HIGH (ref 0.61–1.24)
GFR, Estimated: 59 mL/min — ABNORMAL LOW (ref >60)
Glucose, Bld: 709 mg/dL (ref 70–99)
Potassium: 4.4 mmol/L (ref 3.5–5.1)
Sodium: 130 mmol/L — ABNORMAL LOW (ref 135–145)
Total Bilirubin: 0.2 mg/dL — ABNORMAL LOW (ref 0.3–1.2)
Total Protein: 6.5 g/dL (ref 6.5–8.1)

## 2022-03-02 LAB — URINALYSIS, ROUTINE W REFLEX MICROSCOPIC
Bilirubin Urine: NEGATIVE
Glucose, UA: >=500 mg/dL — AB
Hgb urine dipstick: NEGATIVE
Ketones, ur: NEGATIVE mg/dL
Nitrite: NEGATIVE
Protein, ur: NEGATIVE mg/dL
Specific Gravity, Urine: 1.023 (ref 1.005–1.030)
WBC, UA: >50 WBC/hpf — ABNORMAL HIGH (ref 0–5)
pH: 6 (ref 5.0–8.0)

## 2022-03-02 LAB — CBC WITH DIFFERENTIAL/PLATELET
Abs Immature Granulocytes: 0.03 10*3/uL (ref 0.00–0.07)
Basophils Absolute: 0 10*3/uL (ref 0.0–0.1)
Basophils Relative: 1 %
Eosinophils Absolute: 0.2 10*3/uL (ref 0.0–0.5)
Eosinophils Relative: 4 %
HCT: 40.2 % (ref 39.0–52.0)
Hemoglobin: 12.8 g/dL — ABNORMAL LOW (ref 13.0–17.0)
Immature Granulocytes: 1 %
Lymphocytes Relative: 26 %
Lymphs Abs: 1.7 10*3/uL (ref 0.7–4.0)
MCH: 30.4 pg (ref 26.0–34.0)
MCHC: 31.8 g/dL (ref 30.0–36.0)
MCV: 95.5 fL (ref 80.0–100.0)
Monocytes Absolute: 0.5 10*3/uL (ref 0.1–1.0)
Monocytes Relative: 8 %
Neutro Abs: 4 10*3/uL (ref 1.7–7.7)
Neutrophils Relative %: 60 %
Platelets: 372 10*3/uL (ref 150–400)
RBC: 4.21 MIL/uL — ABNORMAL LOW (ref 4.22–5.81)
RDW: 14.2 % (ref 11.5–15.5)
WBC: 6.5 10*3/uL (ref 4.0–10.5)
nRBC: 0 % (ref 0.0–0.2)

## 2022-03-02 LAB — CBG MONITORING, ED
Glucose-Capillary: >600 mg/dL (ref 70–99)
Glucose-Capillary: 241 mg/dL — ABNORMAL HIGH (ref 70–99)
Glucose-Capillary: 390 mg/dL — ABNORMAL HIGH (ref 70–99)

## 2022-03-02 LAB — ETHANOL: Alcohol, Ethyl (B): <10 mg/dL (ref <10)

## 2022-03-02 LAB — RESP PANEL BY RT-PCR (RSV, FLU A&B, COVID)  RVPGX2
Influenza A by PCR: NEGATIVE
Influenza B by PCR: NEGATIVE
Resp Syncytial Virus by PCR: NEGATIVE
SARS Coronavirus 2 by RT PCR: NEGATIVE

## 2022-03-02 LAB — RAPID URINE DRUG SCREEN, HOSP PERFORMED
Amphetamines: NOT DETECTED
Barbiturates: NOT DETECTED
Benzodiazepines: NOT DETECTED
Cocaine: POSITIVE — AB
Opiates: NOT DETECTED
Tetrahydrocannabinol: NOT DETECTED

## 2022-03-02 MED ORDER — INSULIN ASPART 100 UNIT/ML IJ SOLN
16.0000 [IU] | Freq: Once | INTRAMUSCULAR | Status: AC
  Administered 2022-03-02: 16 [IU] via INTRAVENOUS

## 2022-03-02 MED ORDER — SODIUM CHLORIDE 0.9 % IV BOLUS
1000.0000 mL | Freq: Once | INTRAVENOUS | Status: AC
  Administered 2022-03-02: 1000 mL via INTRAVENOUS

## 2022-03-02 MED ORDER — FLUCONAZOLE 150 MG PO TABS
150.0000 mg | ORAL_TABLET | Freq: Once | ORAL | Status: AC
  Administered 2022-03-02: 150 mg via ORAL
  Filled 2022-03-02: qty 1

## 2022-03-02 MED ORDER — ACETAMINOPHEN 325 MG PO TABS
650.0000 mg | ORAL_TABLET | Freq: Once | ORAL | Status: AC
  Administered 2022-03-02: 650 mg via ORAL
  Filled 2022-03-02: qty 2

## 2022-03-02 MED ORDER — INSULIN GLARGINE-YFGN 100 UNIT/ML ~~LOC~~ SOLN
24.0000 [IU] | Freq: Every day | SUBCUTANEOUS | Status: DC
  Administered 2022-03-02: 24 [IU] via SUBCUTANEOUS
  Filled 2022-03-02 (×2): qty 0.24

## 2022-03-02 NOTE — Care Management (Addendum)
Needs medical clearance then can self present at [facility], list of resources placed earlier by CSW Spoke to patient at bedside, patient is frustrated when told that we do not do detox here. Patient wants help, is forgetful though saying the same things over again. Patient states he is currently homeless, needs a place to stay, had been staying in hotels but running out of money,  Plan:  Medically get cleared. Cab voucher for transfer to [facility]

## 2022-03-02 NOTE — Social Work (Signed)
CSW added in patient and outpatient SUD resources.

## 2022-03-02 NOTE — ED Provider Notes (Signed)
[facility] EMERGENCY DEPARTMENT Provider Note   CSN: [id number] Arrival date & time: 03/01/22  1619     History  Chief Complaint  Patient presents with   rehab    David Wyatt is a 61 y.o. male, history of hypertension, diabetes, who presents to the ED secondary to wanting help for his crack cocaine detox.  He states last use crack cocaine 2 days ago, and was told to come to the ER because he needs to go to detox/rehab.  Notes that he was told to come here by someone downstairs.  Does not know who.  Denies any chest pain, shortness of breath, SI HI, AVH, no tremors, nausea, vomiting, diarrhea.  Overall feels fairly well, just frustrated that he has had to wait so long. Home Medications Prior to Admission medications   Medication Sig Start Date End Date Taking? Authorizing Provider  Accu-Chek FastClix Lancets MISC Use as directed 02/05/22 03/07/22  Princess Bruins, DO  Accu-Chek Softclix Lancets lancets Use up to 4 times daily as directed 01/23/22   Princess Bruins, DO  amLODipine (NORVASC) 10 MG tablet Take 1 tablet (10 mg total) by mouth daily. 01/23/22 02/22/22  Princess Bruins, DO  atorvastatin (LIPITOR) 40 MG tablet Take 1 tablet (40 mg total) by mouth daily. 01/23/22 02/22/22  Princess Bruins, DO  Blood Glucose Monitoring Suppl (ACCU-CHEK GUIDE) w/Device KIT USE AS DIRECTED TO CHECK BLOOD SUGARS UP TO TWICE PER DAY 01/23/22   Princess Bruins, DO  DULoxetine (CYMBALTA) 60 MG capsule Take 1 capsule (60 mg total) by mouth daily. 01/23/22 02/22/22  Princess Bruins, DO  Emollient (LUBRIDERM SERIOUSLY SENSITIVE) LOTN Apply 1 Application topically 2 (two) times daily. 02/05/22   Princess Bruins, DO  famotidine (PEPCID) 10 MG tablet Take 1 tablet (10 mg total) by mouth 2 (two) times daily. 02/05/22 04/06/22  Princess Bruins, DO  feeding supplement, GLUCERNA SHAKE, (GLUCERNA SHAKE) LIQD Take 237 mLs by mouth 3 (three) times daily between meals. 02/05/22 04/06/22  Princess Bruins, DO   gabapentin (NEURONTIN) 300 MG capsule Take 2 capsules (600 mg total) by mouth 3 (three) times daily. 01/23/22 02/22/22  Princess Bruins, DO  glipiZIDE (GLUCOTROL XL) 10 MG 24 hr tablet Take 1 tablet (10 mg total) by mouth daily with breakfast. 01/23/22 02/22/22  Princess Bruins, DO  glucose blood (ACCU-CHEK GUIDE) test strip Use up to 3 times daily as directed 01/23/22   Princess Bruins, DO  insulin aspart (NOVOLOG) 100 UNIT/ML injection Inject 3 Units into the skin 2 (two) times daily before lunch and supper. 02/05/22 07/22/22  Princess Bruins, DO  Insulin Glargine Solostar (LANTUS) 100 UNIT/ML Solostar Pen Inject 24 Units into the skin daily. 01/23/22   Princess Bruins, DO  Insulin Pen Needle 31G X 8 MM MISC Use with insulin pen 01/23/22   Princess Bruins, DO  melatonin 3 MG TABS tablet Take 1 tablet (3 mg total) by mouth at bedtime. 02/05/22 03/07/22  Princess Bruins, DO  metFORMIN (GLUCOPHAGE) 1000 MG tablet Take 1 tablet (1,000 mg total) by mouth 2 (two) times daily. 01/23/22 02/22/22  Princess Bruins, DO  nicotine (NICODERM CQ - DOSED IN MG/24 HOURS) 21 mg/24hr patch Place 1 patch (21 mg total) onto the skin daily at 6 (six) AM. 02/04/22   [name], Mardelle Matte, MD  tamsulosin (FLOMAX) 0.4 MG CAPS capsule Take 2 capsules (0.8 mg total) by mouth daily. For renal calculi 02/05/22 03/07/22  Princess Bruins, DO  traZODone (DESYREL) 100 MG tablet Take 1 tablet (100 mg total)  by mouth at bedtime as needed for sleep. 02/03/22   Lauro Franklin, MD  loratadine (CLARITIN) 10 MG tablet Take 1 tablet (10 mg total) by mouth daily. Patient not taking: No sig reported 06/03/17 07/20/20  Loren Racer, MD      Allergies    Lisinopril    Review of Systems   Review of Systems  Respiratory:  Negative for shortness of breath.   Cardiovascular:  Negative for chest pain.  Psychiatric/Behavioral:  Positive for agitation.     Physical Exam Updated Vital Signs BP 125/71   Pulse 81   Temp 98.6 F (37 C) (Oral)    Resp 20   Ht 5\' 8"  (1.727 m)   Wt 74.8 kg   SpO2 97%   BMI 25.09 kg/m  Physical Exam Vitals and nursing note reviewed.  Constitutional:      General: He is not in acute distress.    Appearance: He is well-developed.  HENT:     Head: Normocephalic and atraumatic.  Eyes:     Conjunctiva/sclera: Conjunctivae normal.  Cardiovascular:     Rate and Rhythm: Normal rate and regular rhythm.     Heart sounds: No murmur heard. Pulmonary:     Effort: Pulmonary effort is normal. No respiratory distress.     Breath sounds: Normal breath sounds.  Abdominal:     Palpations: Abdomen is soft.     Tenderness: There is no abdominal tenderness.  Musculoskeletal:        General: No swelling.     Cervical back: Neck supple.  Skin:    General: Skin is warm and dry.     Capillary Refill: Capillary refill takes less than 2 seconds.  Neurological:     Mental Status: He is alert.  Psychiatric:        Mood and Affect: Mood normal.     ED Results / Procedures / Treatments   Labs (all labs ordered are listed, but only abnormal results are displayed) Labs Reviewed  CBC WITH DIFFERENTIAL/PLATELET - Abnormal; Notable for the following components:      Result Value   RBC 4.21 (*)    Hemoglobin 12.8 (*)    All other components within normal limits  COMPREHENSIVE METABOLIC PANEL - Abnormal; Notable for the following components:   Sodium 130 (*)    Chloride 96 (*)    Glucose, Bld 709 (*)    Creatinine, Ser 1.36 (*)    Calcium 8.7 (*)    Albumin 3.0 (*)    Total Bilirubin 0.2 (*)    GFR, Estimated 59 (*)    All other components within normal limits  RAPID URINE DRUG SCREEN, HOSP PERFORMED - Abnormal; Notable for the following components:   Cocaine POSITIVE (*)    All other components within normal limits  URINALYSIS, ROUTINE W REFLEX MICROSCOPIC - Abnormal; Notable for the following components:   Color, Urine STRAW (*)    APPearance HAZY (*)    Glucose, UA >=500 (*)    Leukocytes,Ua LARGE (*)     WBC, UA >50 (*)    Bacteria, UA RARE (*)    All other components within normal limits  CBG MONITORING, ED - Abnormal; Notable for the following components:   Glucose-Capillary >600 (*)    All other components within normal limits  CBG MONITORING, ED - Abnormal; Notable for the following components:   Glucose-Capillary 241 (*)    All other components within normal limits  CBG MONITORING, ED - Abnormal; Notable for the  following components:   Glucose-Capillary 390 (*)    All other components within normal limits  RESP PANEL BY RT-PCR (RSV, FLU A&B, COVID)  [id number]  URINE CULTURE  ETHANOL    EKG None  Radiology DG Lumbar Spine Complete  Result Date: 03/01/2022 CLINICAL DATA:  Back pain EXAM: LUMBAR SPINE - COMPLETE 4+ VIEW COMPARISON:  CT 08/11/2021. FINDINGS: There is no evidence of lumbar spine fracture. Prior anterior posterior fusion at L4-L5. Intact hardware without evidence of loosening or fracture. There is trace residual anterolisthesis at L4-L5. There is moderate degenerative disc disease at L5-S1 and mild degenerative disc disease above the fusion. Advanced facet arthropathy at L3-L4 and moderate facet arthropathy at L5-S1 there is a right-sided nephroureteral stent in place. IMPRESSION: No evidence of lumbar spine fracture. Prior L4-L5 lumbar fusion without evidence of hardware complication. Moderate degenerative disc disease at L5-S1 and mild degenerative disc disease above the fusion. Advanced facet arthropathy at L3-L4. Electronically Signed   By: Caprice Renshaw M.D.   On: 03/01/2022 13:02    Procedures Procedures    Medications Ordered in ED Medications  insulin glargine-yfgn (SEMGLEE) injection 24 Units (24 Units Subcutaneous Given 03/02/22 1939)  fluconazole (DIFLUCAN) tablet 150 mg (150 mg Oral Given 03/02/22 1939)  insulin aspart (novoLOG) injection 16 Units (16 Units Intravenous Given 03/02/22 1937)  sodium chloride 0.9 % bolus 1,000 mL (0 mLs Intravenous Stopped  03/02/22 2223)  acetaminophen (TYLENOL) tablet 650 mg (650 mg Oral Given 03/02/22 2009)    ED Course/ Medical Decision Making/ A&P                           Medical Decision Making Patient is a 61 year old male, here for cocaine rehab, states that he was told to come here, for further evaluation and admission for rehab.  He denies any alcohol use, states he is feeling great, just here for cocaine rehab.  Last used 2 days ago.  Amount and/or Complexity of Data Reviewed Labs: ordered.    Details: Unremarkable except for hyperglycemic of 709, no elevated anion gap. Discussion of management or test interpretation with external provider(s): Patient here for rehab, consulted social work, who spoke to him, and advised him to get beat go to [facility], and was given a cab voucher for behalf.  She request I medically clear the patient.  He was hyperglycemic at 709, however he was asymptomatic, and did not have any nausea, vomiting.  His anion gap was within normal limits.  He did tell me that he did not take his long-acting insulin or any insulin for the last 24 hours as he has been in the waiting room.  I suspect this is likely secondary to untreated diabetes, for the last 24 hours.  He was given his normal insulin, as well as 16 units of fast acting, and his glucose was rechecked.  He was also given a bolus of IV fluids.  His glucose improved to 241 and then it was rechecked once more about an hour later and it was around 390.  He was instructed to continue taking his insulin as prescribed, he was given his daily dose today, and he is instructed to follow-up with [facility] for rehab placement.  He was in no acute distress, and all questions were answered.  Risk OTC drugs. Prescription drug management.      Final Clinical Impression(s) / ED Diagnoses Final diagnoses:  Hyperglycemia due to diabetes mellitus (HCC)  Cocaine use  Rx / DC Orders ED Discharge Orders     None         Pete Pelt,  [place] 03/02/22 2239    Glynn Octave, MD 03/03/22 [phone]

## 2022-03-02 NOTE — ED Triage Notes (Signed)
Pt reports to [facility] by self. Pt reports recently being released from [facility]. Pt reports wanting to work on addiction to crack/cocaine. Pt denies SI/HI and AVH. Pt reports no drug use within the past 24 hours.

## 2022-03-02 NOTE — ED Notes (Signed)
Patient given sandwich bag, ok per PA Small.

## 2022-03-02 NOTE — BH Assessment (Signed)
Comprehensive Clinical Assessment (CCA) Note  03/03/2022 [name] [id number]  DISPOSITION: Completed CCA accompanied by Quintella Reichert, NP who completed MSE and admitted Pt to [facility].  The patient demonstrates the following risk factors for suicide: Chronic risk factors for suicide include: psychiatric disorder of depressive disorder and substance use disorder. Acute risk factors for suicide include: unemployment and loss (financial, interpersonal, professional). Protective factors for this patient include: hope for the future and life satisfaction. Considering these factors, the overall suicide risk at this point appears to be low. Patient is appropriate for outpatient follow up.  Pt is a 61 year old male who presents unaccompanied to [facility] after being referred by case management at [facility] for substance abuse treatment. Pt says he is "trying to better my life." He acknowledges he was tranferred from [facility]  02/06/2022 to [facility] in [place]. He says he completed the program but then was arrested for "failure to appear" and taken to jail. He says he was returned to [facility] but had no place to stay. He says he went to [facility] but they did not have a bed available, so he went to [facility]. He says while there he had medical problems addressed. He told staff he wanted to continue treatment for substance use and feared he would relapse if he returned to being homeless and unsheltered.  Pt says he only uses crack and has not used since before admission to [facility]. He states he feels depressed due to his situation. He denies problems with sleep or appetite. He denies current suicidal ideation or history of suicide attempts. He denies current homicidal ideation. He denies auditory or visual hallucinations. He denies use of alcohol or other substances.  Pt says he has multiple medical problems including diabetes, HTN, spinal problems, hernias and urinary problems. He is receiving disability. He says  he has no support in [place], that his parents are deceased and what family he has is in [place]. He says he has a court date 04/03/2021 for failure to appear. He denies access to firearms. He says he has an opportunity to obtain an apartment for $450 a month but it will not be available until after Christmas.  Pt is casually dressed and eating crackers. He is alert and oriented x4. Pt speaks in a clear tone, at moderate volume and normal pace. Motor behavior appears normal. Eye contact is good. Pt's mood is depressed and affect is responsive. Thought process is coherent and relevant. There is no indication he is currently responding to internal stimuli or experiencing delusional thought content. He is cooperative and requesting assistance.  Chief Complaint:  Chief Complaint  Patient presents with   Addiction Problem   Visit Diagnosis: F14.20 Cocaine use disorder, Severe   CCA Screening, Triage and Referral (STR)  Patient Reported Information How did you hear about [place]? Self  What Is the Reason for Your Visit/Call Today? Pt reports to [facility] by self. Pt reports recently being released from [place]. Pt reports wanting to work on addiction to crack/cocaine. Pt denies SI/HI and AVH. Pt reports no drug use within the past 24 hours.  How Long Has This Been Causing You Problems? 1 wk - 1 month  What Do You Feel Would Help You the Most Today? Alcohol or Drug Use Treatment   Have You Recently Had Any Thoughts About Hurting Yourself? No  Are You Planning to Commit Suicide/Harm Yourself At This time? No   Flowsheet Row ED from 03/02/2022 in [place] Most recent reading at 03/02/2022 11:15 PM ED from 03/01/2022 in [place] Most recent reading at 03/01/2022  5:43 PM ED from 03/01/2022 in [place] Most recent reading at 03/01/2022 12:10 AM  C-SSRS RISK CATEGORY Error: Q3, 4,  or 5 should not be populated when Q2 is No No Risk No Risk       Have you Recently Had Thoughts About [place]? No  Are You Planning to Harm Someone at This Time? No  Explanation: Pt denies suicidal ideation or homicidal ideation   Have You Used Any Alcohol or Drugs in the Past 24 Hours? No  What Did You Use and How Much? Pt denies use of substances in past 24 hours   Do You Currently Have a Therapist/Psychiatrist? No  Name of Therapist/Psychiatrist: Name of Therapist/Psychiatrist: None   Have You Been Recently Discharged From Any Office Practice or Programs? Yes  Explanation of Discharge From Practice/Program: Discharged from [facility] 02/06/2022     CCA Screening Triage Referral Assessment Type of Contact: Face-to-Face  Telemedicine Service Delivery:   Is this Initial or Reassessment?   Date Telepsych consult ordered in [facility]:    Time Telepsych consult ordered in [facility]:    Location of Assessment: [facility] Assessment Services  Provider Location: [facility] Assessment Services   Collateral Involvement: None   Does Patient Have a Stage manager Guardian? No  Legal Guardian Contact Information: NA  Copy of Legal Guardianship Form: -- (NA)  Legal Guardian Notified of Arrival: -- (NA)  Legal Guardian Notified of Pending Discharge: -- (NA)  If Minor and Not Living with Parent(s), Who has Custody? NA  Is CPS involved or ever been involved? Never  Is APS involved or ever been involved? Never   Patient Determined To Be At Risk for Harm To Self or Others Based on Review of Patient Reported Information or Presenting Complaint? No  Method: -- (Pt denies homicidal ideation.)  Availability of Means: -- (Pt denies homicidal ideation.)  Intent: -- (Pt denies homicidal ideation.)  Notification Required: -- (Pt denies homicidal ideation.)  Additional Information for Danger to Others Potential: -- (Pt denies homicidal ideation.)  Additional Comments for Danger  to Others Potential: Pt denies homicidal ideation.  Are There Guns or Other Weapons in [place]? No  Types of Guns/Weapons: None  Are These Weapons Safely Secured?                            -- (NA)  Who Could Verify You Are Able To Have These Secured: NA  Do You Have any Outstanding Charges, Pending Court Dates, Parole/Probation? Pt has a court date 04/03/2022 for "failure to appear."  Contacted To Inform of Risk of Harm To Self or Others: Other: Comment (No safety concern)    Does Patient Present under Involuntary Commitment? No    [place] of Residence: [place]   Patient Currently Receiving the Following Services: Medication Management   Determination of Need: Routine (7 days)   Options For Referral: [facility]     CCA Biopsychosocial Patient Reported Schizophrenia/Schizoaffective Diagnosis in Past: No   Strengths: Resourceful, motivated towards treatment   Mental Health Symptoms Depression:   Fatigue   Duration of Depressive symptoms:  Duration of Depressive Symptoms: Less than two weeks   Mania:   None   Anxiety:    Tension; Worrying   Psychosis:  None   Duration of Psychotic symptoms:    Trauma:   None   Obsessions:   None   Compulsions:   None   Inattention:   N/A   Hyperactivity/Impulsivity:   N/A   Oppositional/Defiant Behaviors:   N/A   Emotional Irregularity:   None   Other Mood/Personality Symptoms:   None    Mental Status Exam Appearance and self-care  Stature:   Average   Weight:   Average weight   Clothing:   Casual   Grooming:   Normal   Cosmetic use:   None   Posture/gait:   Normal   Motor activity:   Not Remarkable   Sensorium  Attention:   Normal   Concentration:   Normal   Orientation:   X5   Recall/memory:   Normal   Affect and Mood  Affect:   Appropriate   Mood:   Depressed   Relating  Eye contact:   Normal   Facial expression:   Responsive   Attitude toward  examiner:   Cooperative   Thought and Language  Speech flow:  Clear and Coherent   Thought content:   Appropriate to Mood and Circumstances   Preoccupation:   None   Hallucinations:   None   Organization:   Coherent; Intact; Goal-directed   Transport planner of Knowledge:   Average   Intelligence:   Average   Abstraction:   Normal   Judgement:   Good   Reality Testing:   Realistic   Insight:   Fair   Decision Making:   Normal   Social Functioning  Social Maturity:   Responsible   Social Judgement:   Normal   Stress  Stressors:   Housing; Museum/gallery curator; Transitions   Coping Ability:   Deficient supports; Overwhelmed   Skill Deficits:   None   Supports:   Support needed     Religion: Religion/Spirituality Are You A Religious Person?: Yes What is Your Religious Affiliation?: None How Might This Affect Treatment?: NA  Leisure/Recreation: Leisure / Recreation Do You Have Hobbies?: No  Exercise/Diet: Exercise/Diet Do You Exercise?: No Have You Gained or Lost A Significant Amount of Weight in the Past Six Months?: No Do You Follow a Special Diet?: Yes Type of Diet: Diabetic Do You Have Any Trouble Sleeping?: No   CCA Employment/Education Employment/Work Situation: Employment / Work Technical sales engineer: On disability Why is Patient on Disability: back issues/past surgery How Long has Patient Been on Disability: "years" Patient's Job has Been Impacted by Current Illness: No Has Patient ever Been in the [employer]?: No  Education: Education Is Patient Currently Attending School?: No Last Grade Completed: 12 Did You Attend College?: No Did You Have An Individualized Education Program (IIEP): No Did You Have Any Difficulty At School?: No Patient's Education Has Been Impacted by Current Illness: No   CCA Family/Childhood History Family and Relationship History: Family history Marital status: Single Does patient  have children?: Yes How many children?: 3 How is patient's relationship with their children?: Hasn't had contact with his children, who live in [place], for "years"  Childhood History:  Childhood History By whom was/is the patient raised?: Both parents Did patient suffer any verbal/emotional/physical/sexual abuse as a child?: No Did patient suffer from severe childhood neglect?: No Has patient ever been sexually abused/assaulted/raped as an adolescent or adult?: No Was the patient ever a victim of a crime or a disaster?: No Witnessed domestic violence?: No Has patient been affected by domestic violence  as an adult?: No       CCA Substance Use Alcohol/Drug Use: Alcohol / Drug Use Pain Medications: See MAR Prescriptions: See MAR Over the Counter: See MAR History of alcohol / drug use?: Yes Longest period of sobriety (when/how long): Hx of crack cocaine use - clean 2-3 months now Negative Consequences of Use: Financial Withdrawal Symptoms: None Substance #1 Name of Substance 1: Cocaine (crack) 1 - Age of First Use: 20s 1 - Amount (size/oz): Varies 1 - Frequency: Daily when available 1 - Duration: Ongoing 1 - Last Use / Amount: 1 month ago 1 - Method of Aquiring: Unknown 1- Route of Use: Smoke inhalation                       ASAM's:  Six Dimensions of Multidimensional Assessment  Dimension 1:  Acute Intoxication and/or Withdrawal Potential:   Dimension 1:  Description of individual's past and current experiences of substance use and withdrawal: Pt reports history of crack use. Has not used in 1 month  Dimension 2:  Biomedical Conditions and Complications:   Dimension 2:  Description of patient's biomedical conditions and  complications: Diabetes, HTN, multiple medical conditions  Dimension 3:  Emotional, Behavioral, or Cognitive Conditions and Complications:  Dimension 3:  Description of emotional, behavioral, or cognitive conditions and complications: Pt reports  depressive symptoms  Dimension 4:  Readiness to Change:  Dimension 4:  Description of Readiness to Change criteria: Pt is motivated for treatment  Dimension 5:  Relapse, Continued use, or Continued Problem Potential:  Dimension 5:  Relapse, continued use, or continued problem potential critiera description: Pt has not used cocaine in 1 month  Dimension 6:  Recovery/Living Environment:  Dimension 6:  Recovery/Iiving environment criteria description: Unsheltered homeless  ASAM Severity Score: ASAM's Severity Rating Score: 10  ASAM Recommended Level of Treatment: ASAM Recommended Level of Treatment: Level III Residential Treatment   Substance use Disorder (SUD) Substance Use Disorder (SUD)  Checklist Symptoms of Substance Use: Persistent desire or unsuccessful efforts to cut down or control use  Recommendations for Services/Supports/Treatments: Recommendations for Services/Supports/Treatments Recommendations For Services/Supports/Treatments: Facility Based Crisis  Discharge Disposition:    DSM5 Diagnoses: Patient Active Problem List   Diagnosis Date Noted   Cocaine use disorder, severe, dependence ([place]) 01/21/2022   Passive suicidal ideations 01/20/2022   Acute respiratory failure with hypoxia ([place]) 10/13/2021   History of cocaine use 07/23/2021   Chronic right shoulder pain 07/23/2021   Renal stones 07/23/2021   Dermoid cyst of skin of back 07/23/2021   Uncontrolled type 2 diabetes mellitus with hyperglycemia ([place]) 03/22/2021   Left kidney mass 03/22/2021   Hydronephrosis of right kidney 02/23/2021   Tobacco abuse 08/19/2018   Hyperlipidemia 04/24/2017   GERD (gastroesophageal reflux disease) 06/13/2015   Diabetic neuropathy ([place]) 05/01/2015   Essential hypertension, benign 11/01/2013   Lumbar disc herniation 09/13/2013   Unspecified constipation 09/13/2013   Chronic back pain 09/13/2013   Felon of finger of left hand with lymphangitis 12/13/2012   Crack cocaine use 07/29/2012    Arthritis 07/29/2012   Weakness generalized 07/29/2012     Referrals to Alternative Service(s): Referred to Alternative Service(s):   Place:   Date:   Time:    Referred to Alternative Service(s):   Place:   Date:   Time:    Referred to Alternative Service(s):   Place:   Date:   Time:    Referred to Alternative Service(s):   Place:   Date:  Time:     Evelena Peat, [facility]

## 2022-03-02 NOTE — Discharge Instructions (Addendum)
Please go to the hospital for further evaluation/resources for cocaine use disorder.  You were given a cab voucher, to make sure you get it there.  You were hyperglycemic, please make sure you take your insulin as prescribed.

## 2022-03-03 DIAGNOSIS — Z79899 Other long term (current) drug therapy: Secondary | ICD-10-CM | POA: Diagnosis not present

## 2022-03-03 DIAGNOSIS — Z794 Long term (current) use of insulin: Secondary | ICD-10-CM | POA: Diagnosis not present

## 2022-03-03 DIAGNOSIS — F1910 Other psychoactive substance abuse, uncomplicated: Secondary | ICD-10-CM

## 2022-03-03 DIAGNOSIS — F1420 Cocaine dependence, uncomplicated: Secondary | ICD-10-CM | POA: Diagnosis not present

## 2022-03-03 DIAGNOSIS — F1410 Cocaine abuse, uncomplicated: Secondary | ICD-10-CM | POA: Diagnosis present

## 2022-03-03 LAB — GLUCOSE, CAPILLARY
Glucose-Capillary: 281 mg/dL — ABNORMAL HIGH (ref 70–99)
Glucose-Capillary: 147 mg/dL — ABNORMAL HIGH (ref 70–99)
Glucose-Capillary: 70 mg/dL (ref 70–99)
Glucose-Capillary: 134 mg/dL — ABNORMAL HIGH (ref 70–99)
Glucose-Capillary: 135 mg/dL — ABNORMAL HIGH (ref 70–99)

## 2022-03-03 LAB — TSH: TSH: 0.735 u[IU]/mL (ref 0.350–4.500)

## 2022-03-03 MED ORDER — TRAZODONE HCL 100 MG PO TABS
100.0000 mg | ORAL_TABLET | Freq: Every evening | ORAL | Status: DC | PRN
  Administered 2022-03-03 – 2022-03-07 (×5): 100 mg via ORAL
  Filled 2022-03-03 (×5): qty 1
  Filled 2022-03-03: qty 7

## 2022-03-03 MED ORDER — HYDROXYZINE HCL 25 MG PO TABS
25.0000 mg | ORAL_TABLET | Freq: Three times a day (TID) | ORAL | Status: DC | PRN
  Administered 2022-03-03 – 2022-03-06 (×4): 25 mg via ORAL
  Filled 2022-03-03 (×4): qty 1

## 2022-03-03 MED ORDER — INSULIN GLARGINE SOLOSTAR 100 UNIT/ML ~~LOC~~ SOPN: 24.0000 [IU] | PEN_INJECTOR | Freq: Every day | SUBCUTANEOUS | Status: DC

## 2022-03-03 MED ORDER — MAGNESIUM HYDROXIDE 400 MG/5ML PO SUSP: 30.0000 mL | Freq: Every day | ORAL | Status: DC | PRN

## 2022-03-03 MED ORDER — ALUM & MAG HYDROXIDE-SIMETH 200-200-20 MG/5ML PO SUSP: 30.0000 mL | ORAL | Status: DC | PRN

## 2022-03-03 MED ORDER — METFORMIN HCL 500 MG PO TABS
1000.0000 mg | ORAL_TABLET | Freq: Two times a day (BID) | ORAL | Status: DC
  Administered 2022-03-03 – 2022-03-08 (×11): 1000 mg via ORAL
  Filled 2022-03-03 (×11): qty 2
  Filled 2022-03-03: qty 28

## 2022-03-03 MED ORDER — FAMOTIDINE 10 MG PO TABS
10.0000 mg | ORAL_TABLET | Freq: Two times a day (BID) | ORAL | Status: DC
  Administered 2022-03-03 – 2022-03-08 (×12): 10 mg via ORAL
  Filled 2022-03-03: qty 1
  Filled 2022-03-03: qty 14
  Filled 2022-03-03 (×11): qty 1

## 2022-03-03 MED ORDER — AMLODIPINE BESYLATE 10 MG PO TABS
10.0000 mg | ORAL_TABLET | Freq: Every day | ORAL | Status: DC
  Administered 2022-03-03 – 2022-03-08 (×6): 10 mg via ORAL
  Filled 2022-03-03: qty 1
  Filled 2022-03-03: qty 7
  Filled 2022-03-03 (×5): qty 1

## 2022-03-03 MED ORDER — LOPERAMIDE HCL 2 MG PO CAPS
2.0000 mg | ORAL_CAPSULE | ORAL | Status: AC
  Administered 2022-03-03: 2 mg via ORAL
  Filled 2022-03-03: qty 1

## 2022-03-03 MED ORDER — INSULIN GLARGINE-YFGN 100 UNIT/ML ~~LOC~~ SOLN
24.0000 [IU] | Freq: Every day | SUBCUTANEOUS | Status: DC
  Administered 2022-03-03 – 2022-03-08 (×6): 24 [IU] via SUBCUTANEOUS

## 2022-03-03 MED ORDER — ATORVASTATIN CALCIUM 40 MG PO TABS
40.0000 mg | ORAL_TABLET | Freq: Every day | ORAL | Status: DC
  Administered 2022-03-03 – 2022-03-08 (×6): 40 mg via ORAL
  Filled 2022-03-03 (×3): qty 1
  Filled 2022-03-03: qty 7
  Filled 2022-03-03 (×3): qty 1

## 2022-03-03 MED ORDER — ACETAMINOPHEN 325 MG PO TABS
650.0000 mg | ORAL_TABLET | Freq: Four times a day (QID) | ORAL | Status: DC | PRN
  Administered 2022-03-04 – 2022-03-06 (×3): 650 mg via ORAL
  Filled 2022-03-03 (×3): qty 2

## 2022-03-03 MED ORDER — TRAZODONE HCL 50 MG PO TABS: 50.0000 mg | ORAL_TABLET | Freq: Every evening | ORAL | Status: DC | PRN

## 2022-03-03 MED ORDER — TAMSULOSIN HCL 0.4 MG PO CAPS
0.8000 mg | ORAL_CAPSULE | Freq: Every day | ORAL | Status: DC
  Administered 2022-03-03 – 2022-03-08 (×6): 0.8 mg via ORAL
  Filled 2022-03-03 (×5): qty 2
  Filled 2022-03-03: qty 14
  Filled 2022-03-03: qty 2

## 2022-03-03 MED ORDER — INSULIN ASPART 100 UNIT/ML IJ SOLN
3.0000 [IU] | Freq: Two times a day (BID) | INTRAMUSCULAR | Status: DC
  Administered 2022-03-03 – 2022-03-07 (×9): 3 [IU] via SUBCUTANEOUS

## 2022-03-03 MED ORDER — GLIPIZIDE ER 5 MG PO TB24
10.0000 mg | ORAL_TABLET | Freq: Every day | ORAL | Status: DC
  Administered 2022-03-03 – 2022-03-08 (×6): 10 mg via ORAL
  Filled 2022-03-03 (×5): qty 2
  Filled 2022-03-03: qty 14
  Filled 2022-03-03: qty 2

## 2022-03-03 MED ORDER — GABAPENTIN 300 MG PO CAPS
600.0000 mg | ORAL_CAPSULE | Freq: Three times a day (TID) | ORAL | Status: DC
  Administered 2022-03-03 – 2022-03-08 (×16): 600 mg via ORAL
  Filled 2022-03-03 (×15): qty 2
  Filled 2022-03-03: qty 42
  Filled 2022-03-03: qty 2

## 2022-03-03 MED ORDER — INSULIN ASPART 100 UNIT/ML IJ SOLN
0.0000 [IU] | Freq: Three times a day (TID) | INTRAMUSCULAR | Status: DC
  Administered 2022-03-03 (×2): 2 [IU] via SUBCUTANEOUS
  Administered 2022-03-03: 3 [IU] via SUBCUTANEOUS

## 2022-03-03 MED ORDER — DULOXETINE HCL 60 MG PO CPEP
60.0000 mg | ORAL_CAPSULE | Freq: Every day | ORAL | Status: DC
  Administered 2022-03-03 – 2022-03-08 (×6): 60 mg via ORAL
  Filled 2022-03-03 (×4): qty 1
  Filled 2022-03-03: qty 7
  Filled 2022-03-03 (×2): qty 1

## 2022-03-03 NOTE — ED Notes (Signed)
Patient was given a one time STAT order for imodium due to his complaint of loose stools. He is currently resting quietly with eyes closed no S/S of distress. Will continue to monitor for safety.

## 2022-03-03 NOTE — ED Notes (Signed)
Pt arrived [facility] Unit 

## 2022-03-03 NOTE — Progress Notes (Signed)
Patients [name] retaken = 134.  After drinking gatorade.  Patient reports feeling "better."  MD aware.

## 2022-03-03 NOTE — ED Notes (Signed)
Pt up to nurse's station requesting for his blood sugar to be checked, stating he thinks it's high because he voided a lot last night. Quintella Reichert, NP notified and advised to check blood sugar. CBG 360. NP notified.

## 2022-03-03 NOTE — Progress Notes (Signed)
Patient complained of feeling tremulous which is indicative of lower cbg.  CBG taken = 70.  MD made aware and patient given gatorade.  Will recheck in 30 minutes.  Patient remains awake and alert.

## 2022-03-03 NOTE — Progress Notes (Addendum)
Patient f.s at lunch = 147.  He received 2 units sliding scale and 3 units standing novolog.  Patient then ate lunch without issue or complaint.  Patient is calm and pleasant on approach.  Cooperative and abiding by unit rules.  Encouraged to make needs known.  Will monitor.

## 2022-03-03 NOTE — ED Notes (Signed)
Patient already awake and alert on unit.  He showered and CBG = 281.  Patient has been pleasant and cooperative as of this time.  No withdrawal at this time.  Will monitor and provide support as needed.

## 2022-03-03 NOTE — ED Notes (Signed)
Pt asleep in bed. Respirations even and unlabored. Monitoring for safety. 

## 2022-03-03 NOTE — ED Provider Notes (Signed)
Behavioral Health Progress Note  Date and Time: 03/03/2022 9:49 AM Name: Merick Kelleher MRN:  [id number]  Subjective:   Marquee Fuchs is a 61 yr old male who presented to [facility] on 12/22 requesting Detox from Crack Cocaine, he was admitted to [facility] on 12/24.  PPHx is significant for Substance Induced Mood Disorder, Cocaine Use Disorder, tobacco use disorder, and no Psychiatric Hospitalizations or Suicide Attempts.  PMHx Is significant for DM2 + neuropathy, HTN, HLD, and housing instability.  He reports that he is doing okay today.  He reports he is tolerating detox so far with out any withdrawal symptoms.  He reports he has no cravings today.  When asked about his residential rehab at [facility] he reports that he got in [facility] and started the program but that he was then arrested for a warrant that is 63 years old.  He reports he was brought back up here and has a court date January 24.  He reports that not long after Christmas day an apartment will be available for him at the rate of $450 a month that includes utilities.  He reports that he just wants to make sure he does things "right this time."  He reports that he wants to make sure he does not need to do anything else related to this warrant.  Discussed with him that social work would be able to help him navigate this and he reported understanding.  He reports that he knows he needs to get clean for himself and that he is highly motivated to do so.  He reports no SI, HI, or AVH.  He reports sleep is okay.  He reports appetite is doing okay.  He reports no other concerns at present.   Diagnosis:  Final diagnoses:  Substance abuse ([place])  Cocaine use disorder ([place])    Total Time spent with patient: 30 minutes  Past Psychiatric History: Substance Induced Mood Disorder, Cocaine Use Disorder, tobacco use disorder, and no Psychiatric Hospitalizations or Suicide Attempts. Past Medical History:  Past Medical History:  Diagnosis Date   Angioedema of lips  07/28/2012   left upper (07/29/2012)   Arthritis    hands and back   Chronic back pain    Cocaine abuse (HCC)    Diabetic neuropathy (HCC)    High cholesterol    Hypertension    Neuropathy    Type II diabetes mellitus ([place])     Past Surgical History:  Procedure Laterality Date   BACK SURGERY     CYSTOSCOPY W/ URETERAL STENT PLACEMENT Right 02/23/2021   Procedure: CYSTOSCOPY WITH RETROGRADE PYELOGRAM/URETERAL STENT PLACEMENT;  Surgeon: Janith Lima, MD;  Location: [facility];  Service: Urology;  Laterality: Right;   HERNIA REPAIR Right 04/01/2012   I & D EXTREMITY Left 12/13/2012   Procedure: IRRIGATION AND DEBRIDEMENT LEFT THUMB;  Surgeon: Tennis Must, MD;  Location: [facility];  Service: Orthopedics;  Laterality: Left;   INCISION AND DRAINAGE OF WOUND     boil on back/notes 07/14/2008 (07/29/2012)   INGUINAL HERNIA REPAIR  04/01/2012   Procedure: HERNIA REPAIR INGUINAL ADULT;  Surgeon: Haywood Lasso, MD;  Location: [facility];  Service: General;  Laterality: Right;   INGUINAL HERNIA REPAIR Left 09/06/2016   Procedure: OPEN REPAIR LEFT INGUINAL HERNIA;  Surgeon: Greer Pickerel, MD;  Location: [facility];  Service: General;  Laterality: Left;   INSERTION OF MESH Left 09/06/2016   Procedure: INSERTION OF MESH;  Surgeon: Greer Pickerel, MD;  Location: [facility];  Service:  General;  Laterality: Left;   TONSILLECTOMY     Family History:  Family History  Problem Relation Age of Onset   Diabetes Mother    Kidney disease Mother    Hyperlipidemia Mother    Hyperlipidemia Father    Diabetes Father    Family Psychiatric  History:  None Reported   Social History:  Social History   Substance and Sexual Activity  Alcohol Use No   Alcohol/week: 0.0 standard drinks of alcohol     Social History   Substance and Sexual Activity  Drug Use Not Currently   Types: "Crack" cocaine    Social History   Socioeconomic History   Marital status: Divorced    Spouse name: Not on file   Number of children: 3   Years  of education: 12   Highest education level: Not on file  Occupational History    Comment: disabled  Tobacco Use   Smoking status: Every Day    Packs/day: 0.25    Years: 30.00    Total pack years: 7.50    Types: Cigarettes   Smokeless tobacco: Former    Quit date: 08/15/2015   Tobacco comments:    04/29/16 2  cigs daily, 10/27/17 sometimes < .25 PPD  Vaping Use   Vaping Use: Never used  Substance and Sexual Activity   Alcohol use: No    Alcohol/week: 0.0 standard drinks of alcohol   Drug use: Not Currently    Types: "Crack" cocaine   Sexual activity: Not on file  Other Topics Concern   Not on file  Social History Narrative   04/29/17   Lives in shelter   Caffeine- a lot of  tea, coffee   Social Determinants of Health   Financial Resource Strain: Not on file  Food Insecurity: Not on file  Transportation Needs: Not on file  Physical Activity: Not on file  Stress: Not on file  Social Connections: Not on file   SDOH:  SDOH Screenings   Depression (PHQ2-9): Low Risk  (03/03/2022)  Recent Concern: Depression (PHQ2-9) - Medium Risk (03/03/2022)  Tobacco Use: High Risk (03/01/2022)   Additional Social History:    Pain Medications: See MAR Prescriptions: See MAR Over the Counter: See MAR History of alcohol / drug use?: Yes Longest period of sobriety (when/how long): Hx of crack cocaine use - clean 2-3 months now Negative Consequences of Use: Financial Withdrawal Symptoms: None Name of Substance 1: Cocaine (crack) 1 - Age of First Use: 20s 1 - Amount (size/oz): Varies 1 - Frequency: Daily when available 1 - Duration: Ongoing 1 - Last Use / Amount: 1 month ago 1 - Method of Aquiring: Unknown 1- Route of Use: Smoke inhalation                  Sleep: Fair  Appetite:  Fair  Current Medications:  Current Facility-Administered Medications  Medication Dose Route Frequency Provider Last Rate Last Admin   acetaminophen (TYLENOL) tablet 650 mg  650 mg Oral Q6H PRN  Bobbitt, Shalon E, NP       alum & mag hydroxide-simeth (MAALOX/MYLANTA) 200-200-20 MG/5ML suspension 30 mL  30 mL Oral Q4H PRN Bobbitt, Shalon E, NP       amLODipine (NORVASC) tablet 10 mg  10 mg Oral Daily Bobbitt, Shalon E, NP   10 mg at 03/03/22 0945   atorvastatin (LIPITOR) tablet 40 mg  40 mg Oral Daily Bobbitt, Shalon E, NP   40 mg at 03/03/22 0945   DULoxetine (  CYMBALTA) DR capsule 60 mg  60 mg Oral Daily Bobbitt, Shalon E, NP   60 mg at 03/03/22 0945   famotidine (PEPCID) tablet 10 mg  10 mg Oral BID Bobbitt, Shalon E, NP   10 mg at 03/03/22 0945   gabapentin (NEURONTIN) capsule 600 mg  600 mg Oral TID Bobbitt, Shalon E, NP   600 mg at 03/03/22 0944   glipiZIDE (GLUCOTROL XL) 24 hr tablet 10 mg  10 mg Oral Q breakfast Bobbitt, Shalon E, NP   10 mg at 03/03/22 0816   hydrOXYzine (ATARAX) tablet 25 mg  25 mg Oral TID PRN Bobbitt, Shalon E, NP       insulin aspart (novoLOG) injection 0-15 Units  0-15 Units Subcutaneous TID WC Briant Cedar, MD   3 Units at 03/03/22 0816   insulin aspart (novoLOG) injection 3 Units  3 Units Subcutaneous BID AC Bobbitt, Shalon E, NP       insulin glargine-yfgn (SEMGLEE) injection 24 Units  24 Units Subcutaneous Daily Hampton Abbot, MD   24 Units at 03/03/22 0946   magnesium hydroxide (MILK OF MAGNESIA) suspension 30 mL  30 mL Oral Daily PRN Bobbitt, Shalon E, NP       metFORMIN (GLUCOPHAGE) tablet 1,000 mg  1,000 mg Oral BID WC Bobbitt, Shalon E, NP   1,000 mg at 03/03/22 0816   tamsulosin (FLOMAX) capsule 0.8 mg  0.8 mg Oral Daily Bobbitt, Shalon E, NP   0.8 mg at 03/03/22 0945   traZODone (DESYREL) tablet 100 mg  100 mg Oral QHS PRN Bobbitt, Shalon E, NP       Current Outpatient Medications  Medication Sig Dispense Refill   Accu-Chek FastClix Lancets MISC Use as directed 100 each 0   Accu-Chek Softclix Lancets lancets Use up to 4 times daily as directed 100 each 0   amLODipine (NORVASC) 10 MG tablet Take 1 tablet (10 mg total) by mouth daily. 30  tablet 0   atorvastatin (LIPITOR) 40 MG tablet Take 1 tablet (40 mg total) by mouth daily. 30 tablet 0   Blood Glucose Monitoring Suppl (ACCU-CHEK GUIDE) w/Device KIT USE AS DIRECTED TO CHECK BLOOD SUGARS UP TO TWICE PER DAY 1 kit 0   DULoxetine (CYMBALTA) 60 MG capsule Take 1 capsule (60 mg total) by mouth daily. 30 capsule 0   Emollient (LUBRIDERM SERIOUSLY SENSITIVE) LOTN Apply 1 Application topically 2 (two) times daily. 473 mL 0   famotidine (PEPCID) 10 MG tablet Take 1 tablet (10 mg total) by mouth 2 (two) times daily. 60 tablet 1   feeding supplement, GLUCERNA SHAKE, (GLUCERNA SHAKE) LIQD Take 237 mLs by mouth 3 (three) times daily between meals. 42660 mL 0   gabapentin (NEURONTIN) 300 MG capsule Take 2 capsules (600 mg total) by mouth 3 (three) times daily. 180 capsule 0   glipiZIDE (GLUCOTROL XL) 10 MG 24 hr tablet Take 1 tablet (10 mg total) by mouth daily with breakfast. 30 tablet 0   glucose blood (ACCU-CHEK GUIDE) test strip Use up to 3 times daily as directed 100 each 3   insulin aspart (NOVOLOG) 100 UNIT/ML injection Inject 3 Units into the skin 2 (two) times daily before lunch and supper. 10 mL 0   Insulin Glargine Solostar (LANTUS) 100 UNIT/ML Solostar Pen Inject 24 Units into the skin daily. 15 mL 2   Insulin Pen Needle 31G X 8 MM MISC Use with insulin pen 100 each 1   melatonin 3 MG TABS tablet Take 1 tablet (3  mg total) by mouth at bedtime. 30 tablet 0   metFORMIN (GLUCOPHAGE) 1000 MG tablet Take 1 tablet (1,000 mg total) by mouth 2 (two) times daily. 60 tablet 0   nicotine (NICODERM CQ - DOSED IN MG/24 HOURS) 21 mg/24hr patch Place 1 patch (21 mg total) onto the skin daily at 6 (six) AM. 28 patch 0   tamsulosin (FLOMAX) 0.4 MG CAPS capsule Take 2 capsules (0.8 mg total) by mouth daily. For renal calculi 60 capsule 0   traZODone (DESYREL) 100 MG tablet Take 1 tablet (100 mg total) by mouth at bedtime as needed for sleep. 15 tablet 0    Labs  Lab Results:  Admission on  03/02/2022  Component Date Value Ref Range Status   TSH 03/02/2022 0.735  0.350 - 4.500 uIU/mL Final   Comment: Performed by a 3rd Generation assay with a functional sensitivity of <=0.01 uIU/mL. Performed at [facility], [place] [address], [place], [place] [address]    Glucose-Capillary 03/03/2022 281 (H)  70 - 99 mg/dL Final   Glucose reference range applies only to samples taken after fasting for at least 8 hours.  Admission on 03/01/2022, Discharged on 03/02/2022  Component Date Value Ref Range Status   WBC 03/02/2022 6.5  4.0 - 10.5 K/uL Final   RBC 03/02/2022 4.21 (L)  4.22 - 5.81 MIL/uL Final   Hemoglobin 03/02/2022 12.8 (L)  13.0 - 17.0 g/dL Final   HCT 03/02/2022 40.2  39.0 - 52.0 % Final   MCV 03/02/2022 95.5  80.0 - 100.0 fL Final   MCH 03/02/2022 30.4  26.0 - 34.0 pg Final   MCHC 03/02/2022 31.8  30.0 - 36.0 g/dL Final   RDW 03/02/2022 14.2  11.5 - 15.5 % Final   Platelets 03/02/2022 372  150 - 400 K/uL Final   nRBC 03/02/2022 0.0  0.0 - 0.2 % Final   Neutrophils Relative % 03/02/2022 60  % Final   Neutro Abs 03/02/2022 4.0  1.7 - 7.7 K/uL Final   Lymphocytes Relative 03/02/2022 26  % Final   Lymphs Abs 03/02/2022 1.7  0.7 - 4.0 K/uL Final   Monocytes Relative 03/02/2022 8  % Final   Monocytes Absolute 03/02/2022 0.5  0.1 - 1.0 K/uL Final   Eosinophils Relative 03/02/2022 4  % Final   Eosinophils Absolute 03/02/2022 0.2  0.0 - 0.5 K/uL Final   Basophils Relative 03/02/2022 1  % Final   Basophils Absolute 03/02/2022 0.0  0.0 - 0.1 K/uL Final   Immature Granulocytes 03/02/2022 1  % Final   Abs Immature Granulocytes 03/02/2022 0.03  0.00 - 0.07 K/uL Final   Performed at [facility], [place] [address], [place], [place] [address]   Sodium 03/02/2022 130 (L)  135 - 145 mmol/L Final   Potassium 03/02/2022 4.4  3.5 - 5.1 mmol/L Final   Chloride 03/02/2022 96 (L)  98 - 111 mmol/L Final   CO2 03/02/2022 24  22 - 32 mmol/L Final   Glucose, Bld 03/02/2022 709 (HH)  70 -  99 mg/dL Final   Comment: CRITICAL RESULT CALLED TO, READ BACK BY AND VERIFIED WITH J. NEWTON RN 03/02/22 _0  BY J. WHITE Glucose reference range applies only to samples taken after fasting for at least 8 hours.    BUN 03/02/2022 16  8 - 23 mg/dL Final   Creatinine, Ser 03/02/2022 1.36 (H)  0.61 - 1.24 mg/dL Final   Calcium 03/02/2022 8.7 (L)  8.9 - 10.3 mg/dL Final  Total Protein 03/02/2022 6.5  6.5 - 8.1 g/dL Final   Albumin 03/02/2022 3.0 (L)  3.5 - 5.0 g/dL Final   AST 03/02/2022 35  15 - 41 U/L Final   ALT 03/02/2022 22  0 - 44 U/L Final   Alkaline Phosphatase 03/02/2022 92  38 - 126 U/L Final   Total Bilirubin 03/02/2022 0.2 (L)  0.3 - 1.2 mg/dL Final   GFR, Estimated 03/02/2022 59 (L)  >60 mL/min Final   Comment: (NOTE) Calculated using the CKD-EPI Creatinine Equation (2021)    Anion gap 03/02/2022 10  5 - 15 Final   Performed at [facility], [place] [address], [place], [place] [address]   Alcohol, Ethyl (B) 03/02/2022 <10  <10 mg/dL Final   Comment: (NOTE) Lowest detectable limit for serum alcohol is 10 mg/dL.  For medical purposes only. Performed at [facility], [place] [address] [place], [place] [address]    Opiates 03/02/2022 NONE DETECTED  NONE DETECTED Final   Cocaine 03/02/2022 POSITIVE (A)  NONE DETECTED Final   Benzodiazepines 03/02/2022 NONE DETECTED  NONE DETECTED Final   Amphetamines 03/02/2022 NONE DETECTED  NONE DETECTED Final   Tetrahydrocannabinol 03/02/2022 NONE DETECTED  NONE DETECTED Final   Barbiturates 03/02/2022 NONE DETECTED  NONE DETECTED Final   Comment: (NOTE) DRUG SCREEN FOR MEDICAL PURPOSES ONLY.  IF CONFIRMATION IS NEEDED FOR ANY PURPOSE, NOTIFY LAB WITHIN 5 DAYS.  LOWEST DETECTABLE LIMITS FOR URINE DRUG SCREEN Drug Class                     Cutoff (ng/mL) Amphetamine and metabolites    1000 Barbiturate and metabolites    200 Benzodiazepine                 200 Opiates and metabolites        300 Cocaine and metabolites         300 THC                            50 Performed at [facility], [place] [address], [place], [place] [address]    Color, Urine 03/02/2022 STRAW (A)  YELLOW Final   APPearance 03/02/2022 HAZY (A)  CLEAR Final   Specific Gravity, Urine 03/02/2022 1.023  1.005 - 1.030 Final   pH 03/02/2022 6.0  5.0 - 8.0 Final   Glucose, UA 03/02/2022 >=500 (A)  NEGATIVE mg/dL Final   Hgb urine dipstick 03/02/2022 NEGATIVE  NEGATIVE Final   Bilirubin Urine 03/02/2022 NEGATIVE  NEGATIVE Final   Ketones, ur 03/02/2022 NEGATIVE  NEGATIVE mg/dL Final   Protein, ur 03/02/2022 NEGATIVE  NEGATIVE mg/dL Final   Nitrite 03/02/2022 NEGATIVE  NEGATIVE Final   Leukocytes,Ua 03/02/2022 LARGE (A)  NEGATIVE Final   RBC / HPF 03/02/2022 0-5  0 - 5 RBC/hpf Final   WBC, UA 03/02/2022 >50 (H)  0 - 5 WBC/hpf Final   Bacteria, UA 03/02/2022 RARE (A)  NONE SEEN Final   Squamous Epithelial / LPF 03/02/2022 0-5  0 - 5 Final   WBC Clumps 03/02/2022 PRESENT   Final   Mucus 03/02/2022 PRESENT   Final   Budding Yeast 03/02/2022 PRESENT   Final   Performed at [facility], [place] [address] [place], [place] [address]   SARS Coronavirus 2 by RT PCR 03/02/2022 NEGATIVE  NEGATIVE Final   Comment: (NOTE) SARS-CoV-2 target nucleic acids are NOT DETECTED.  The SARS-CoV-2  RNA is generally detectable in upper respiratory specimens during the acute phase of infection. The lowest concentration of SARS-CoV-2 viral copies this assay can detect is 138 copies/mL. A negative result does not preclude SARS-Cov-2 infection and should not be used as the sole basis for treatment or other patient management decisions. A negative result may occur with  improper specimen collection/handling, submission of specimen other than nasopharyngeal swab, presence of viral mutation(s) within the areas targeted by this assay, and inadequate number of viral copies(<138 copies/mL). A negative result must be combined with clinical observations, patient  history, and epidemiological information. The expected result is Negative.  Fact Sheet for Patients:  EntrepreneurPulse.com.au  Fact Sheet for Healthcare Providers:  IncredibleEmployment.be  This test is no                          t yet approved or cleared by the [place] FDA and  has been authorized for detection and/or diagnosis of SARS-CoV-2 by FDA under an Emergency Use Authorization (EUA). This EUA will remain  in effect (meaning this test can be used) for the duration of the COVID-19 declaration under Section 564(b)(1) of the Act, 21 U.S.C.section 360bbb-3(b)(1), unless the authorization is terminated  or revoked sooner.       Influenza A by PCR 03/02/2022 NEGATIVE  NEGATIVE Final   Influenza B by PCR 03/02/2022 NEGATIVE  NEGATIVE Final   Comment: (NOTE) The Xpert Xpress SARS-CoV-2/FLU/RSV plus assay is intended as an aid in the diagnosis of influenza from Nasopharyngeal swab specimens and should not be used as a sole basis for treatment. Nasal washings and aspirates are unacceptable for Xpert Xpress SARS-CoV-2/FLU/RSV testing.  Fact Sheet for Patients: EntrepreneurPulse.com.au  Fact Sheet for Healthcare Providers: IncredibleEmployment.be  This test is not yet approved or cleared by the [place] FDA and has been authorized for detection and/or diagnosis of SARS-CoV-2 by FDA under an Emergency Use Authorization (EUA). This EUA will remain in effect (meaning this test can be used) for the duration of the COVID-19 declaration under Section 564(b)(1) of the Act, 21 U.S.C. section 360bbb-3(b)(1), unless the authorization is terminated or revoked.     Resp Syncytial Virus by PCR 03/02/2022 NEGATIVE  NEGATIVE Final   Comment: (NOTE) Fact Sheet for Patients: EntrepreneurPulse.com.au  Fact Sheet for Healthcare Providers: IncredibleEmployment.be  This  test is not yet approved or cleared by the [place] FDA and has been authorized for detection and/or diagnosis of SARS-CoV-2 by FDA under an Emergency Use Authorization (EUA). This EUA will remain in effect (meaning this test can be used) for the duration of the COVID-19 declaration under Section 564(b)(1) of the Act, 21 U.S.C. section 360bbb-3(b)(1), unless the authorization is terminated or revoked.  Performed at [facility], [place] [address], [place], [place] [address]    Glucose-Capillary 03/02/2022 >600 (HH)  70 - 99 mg/dL Final   Glucose reference range applies only to samples taken after fasting for at least 8 hours.   Comment 1 03/02/2022 Document in Chart   Final   Glucose-Capillary 03/02/2022 241 (H)  70 - 99 mg/dL Final   Glucose reference range applies only to samples taken after fasting for at least 8 hours.   Comment 1 03/02/2022 Document in Chart   Final   Glucose-Capillary 03/02/2022 390 (H)  70 - 99 mg/dL Final   Glucose reference range applies only to samples taken after fasting for at least 8 hours.  Admission on 03/01/2022, Discharged  on 03/01/2022  Component Date Value Ref Range Status   Glucose-Capillary 03/01/2022 289 (H)  70 - 99 mg/dL Final   Glucose reference range applies only to samples taken after fasting for at least 8 hours.  Admission on 01/21/2022, Discharged on 02/06/2022  Component Date Value Ref Range Status   Hgb A1c MFr Bld 01/21/2022 10.4 (H)  4.8 - 5.6 % Final   Comment: (NOTE) Pre diabetes:          5.7%-6.4%  Diabetes:              >6.4%  Glycemic control for   <7.0% adults with diabetes    Mean Plasma Glucose 01/21/2022 251.78  mg/dL Final   Performed at [facility] [address] [place], [place] [address]   TSH 01/21/2022 0.248 (L)  0.350 - 4.500 uIU/mL Final   Comment: Performed by a 3rd Generation assay with a functional sensitivity of <=0.01 uIU/mL. Performed at [facility], [place] [address],  [place], [place] [address]    Glucose-Capillary 01/21/2022 374 (H)  70 - 99 mg/dL Final   Glucose reference range applies only to samples taken after fasting for at least 8 hours.   Glucose-Capillary 01/21/2022 289 (H)  70 - 99 mg/dL Final   Glucose reference range applies only to samples taken after fasting for at least 8 hours.   Glucose-Capillary 01/22/2022 137 (H)  70 - 99 mg/dL Final   Glucose reference range applies only to samples taken after fasting for at least 8 hours.   Glucose-Capillary 01/22/2022 297 (H)  70 - 99 mg/dL Final   Glucose reference range applies only to samples taken after fasting for at least 8 hours.   T3, Free 01/23/2022 2.2  2.0 - 4.4 pg/mL Final   Comment: (NOTE) Performed At: [facility] [place], [place] [id number] Rush Farmer MD [id number]    Glucose-Capillary 01/22/2022 94  70 - 99 mg/dL Final   Glucose reference range applies only to samples taken after fasting for at least 8 hours.   Free T4 01/23/2022 0.72  0.61 - 1.12 ng/dL Final   Comment: (NOTE) Biotin ingestion may interfere with free T4 tests. If the results are inconsistent with the TSH level, previous test results, or the clinical presentation, then consider biotin interference. If needed, order repeat testing after stopping biotin. Performed at [facility], [place] [address], [place], [place] [address]    TSH 01/23/2022 0.203 (L)  0.350 - 4.500 uIU/mL Final   Comment: Performed by a 3rd Generation assay with a functional sensitivity of <=0.01 uIU/mL. Performed at [facility], [place] [address] [place], [place] [address]    Glucose-Capillary 01/23/2022 132 (H)  70 - 99 mg/dL Final   Glucose reference range applies only to samples taken after fasting for at least 8 hours.   Glucose-Capillary 01/22/2022 59 (L)  70 - 99 mg/dL Final   Glucose reference range applies only to samples taken after fasting for at least 8 hours.   Glucose-Capillary 01/22/2022 177  (H)  70 - 99 mg/dL Final   Glucose reference range applies only to samples taken after fasting for at least 8 hours.   Glucose-Capillary 01/23/2022 284 (H)  70 - 99 mg/dL Final   Glucose reference range applies only to samples taken after fasting for at least 8 hours.   Glucose-Capillary 01/23/2022 188 (H)  70 - 99 mg/dL Final   Glucose reference range applies only to samples  taken after fasting for at least 8 hours.   Glucose-Capillary 01/23/2022 294 (H)  70 - 99 mg/dL Final   Glucose reference range applies only to samples taken after fasting for at least 8 hours.   Glucose-Capillary 01/24/2022 180 (H)  70 - 99 mg/dL Final   Glucose reference range applies only to samples taken after fasting for at least 8 hours.   Glucose-Capillary 01/24/2022 275 (H)  70 - 99 mg/dL Final   Glucose reference range applies only to samples taken after fasting for at least 8 hours.   Glucose-Capillary 01/24/2022 146 (H)  70 - 99 mg/dL Final   Glucose reference range applies only to samples taken after fasting for at least 8 hours.   Glucose-Capillary 01/24/2022 137 (H)  70 - 99 mg/dL Final   Glucose reference range applies only to samples taken after fasting for at least 8 hours.   Glucose-Capillary 01/25/2022 201 (H)  70 - 99 mg/dL Final   Glucose reference range applies only to samples taken after fasting for at least 8 hours.   Glucose-Capillary 01/25/2022 195 (H)  70 - 99 mg/dL Final   Glucose reference range applies only to samples taken after fasting for at least 8 hours.   Glucose-Capillary 01/25/2022 149 (H)  70 - 99 mg/dL Final   Glucose reference range applies only to samples taken after fasting for at least 8 hours.   Glucose-Capillary 01/25/2022 147 (H)  70 - 99 mg/dL Final   Glucose reference range applies only to samples taken after fasting for at least 8 hours.   Glucose-Capillary 01/26/2022 123 (H)  70 - 99 mg/dL Final   Glucose reference range applies only to samples taken after fasting for  at least 8 hours.   Glucose-Capillary 01/26/2022 176 (H)  70 - 99 mg/dL Final   Glucose reference range applies only to samples taken after fasting for at least 8 hours.   Glucose-Capillary 01/26/2022 84  70 - 99 mg/dL Final   Glucose reference range applies only to samples taken after fasting for at least 8 hours.   Glucose-Capillary 01/26/2022 177 (H)  70 - 99 mg/dL Final   Glucose reference range applies only to samples taken after fasting for at least 8 hours.   Glucose-Capillary 01/27/2022 150 (H)  70 - 99 mg/dL Final   Glucose reference range applies only to samples taken after fasting for at least 8 hours.   Glucose-Capillary 01/27/2022 171 (H)  70 - 99 mg/dL Final   Glucose reference range applies only to samples taken after fasting for at least 8 hours.   Glucose-Capillary 01/27/2022 159 (H)  70 - 99 mg/dL Final   Glucose reference range applies only to samples taken after fasting for at least 8 hours.   Glucose-Capillary 01/27/2022 67 (L)  70 - 99 mg/dL Final   Glucose reference range applies only to samples taken after fasting for at least 8 hours.   Glucose-Capillary 01/27/2022 107 (H)  70 - 99 mg/dL Final   Glucose reference range applies only to samples taken after fasting for at least 8 hours.   Glucose-Capillary 01/27/2022 144 (H)  70 - 99 mg/dL Final   Glucose reference range applies only to samples taken after fasting for at least 8 hours.   Glucose-Capillary 01/28/2022 175 (H)  70 - 99 mg/dL Final   Glucose reference range applies only to samples taken after fasting for at least 8 hours.   Glucose-Capillary 01/28/2022 198 (H)  70 - 99 mg/dL Final  Glucose reference range applies only to samples taken after fasting for at least 8 hours.   Glucose-Capillary 01/28/2022 186 (H)  70 - 99 mg/dL Final   Glucose reference range applies only to samples taken after fasting for at least 8 hours.   Glucose-Capillary 01/28/2022 198 (H)  70 - 99 mg/dL Final   Glucose reference range  applies only to samples taken after fasting for at least 8 hours.   Glucose-Capillary 01/29/2022 111 (H)  70 - 99 mg/dL Final   Glucose reference range applies only to samples taken after fasting for at least 8 hours.   Glucose-Capillary 01/29/2022 160 (H)  70 - 99 mg/dL Final   Glucose reference range applies only to samples taken after fasting for at least 8 hours.   Glucose-Capillary 01/29/2022 209 (H)  70 - 99 mg/dL Final   Glucose reference range applies only to samples taken after fasting for at least 8 hours.   Glucose-Capillary 01/29/2022 155 (H)  70 - 99 mg/dL Final   Glucose reference range applies only to samples taken after fasting for at least 8 hours.   Glucose-Capillary 01/30/2022 157 (H)  70 - 99 mg/dL Final   Glucose reference range applies only to samples taken after fasting for at least 8 hours.   Glucose-Capillary 01/30/2022 168 (H)  70 - 99 mg/dL Final   Glucose reference range applies only to samples taken after fasting for at least 8 hours.   Glucose-Capillary 01/30/2022 217 (H)  70 - 99 mg/dL Final   Glucose reference range applies only to samples taken after fasting for at least 8 hours.   Glucose-Capillary 01/30/2022 246 (H)  70 - 99 mg/dL Final   Glucose reference range applies only to samples taken after fasting for at least 8 hours.   Glucose-Capillary 01/30/2022 287 (H)  70 - 99 mg/dL Final   Glucose reference range applies only to samples taken after fasting for at least 8 hours.   Glucose-Capillary 01/31/2022 138 (H)  70 - 99 mg/dL Final   Glucose reference range applies only to samples taken after fasting for at least 8 hours.   Glucose-Capillary 01/31/2022 193 (H)  70 - 99 mg/dL Final   Glucose reference range applies only to samples taken after fasting for at least 8 hours.   Glucose-Capillary 01/31/2022 274 (H)  70 - 99 mg/dL Final   Glucose reference range applies only to samples taken after fasting for at least 8 hours.   Glucose-Capillary 01/31/2022 320  (H)  70 - 99 mg/dL Final   Glucose reference range applies only to samples taken after fasting for at least 8 hours.   Glucose-Capillary 01/31/2022 160 (H)  70 - 99 mg/dL Final   Glucose reference range applies only to samples taken after fasting for at least 8 hours.   Glucose-Capillary 02/01/2022 184 (H)  70 - 99 mg/dL Final   Glucose reference range applies only to samples taken after fasting for at least 8 hours.   Glucose-Capillary 02/01/2022 138 (H)  70 - 99 mg/dL Final   Glucose reference range applies only to samples taken after fasting for at least 8 hours.   Glucose-Capillary 02/01/2022 171 (H)  70 - 99 mg/dL Final   Glucose reference range applies only to samples taken after fasting for at least 8 hours.   Glucose-Capillary 02/01/2022 124 (H)  70 - 99 mg/dL Final   Glucose reference range applies only to samples taken after fasting for at least 8 hours.   Glucose-Capillary  02/02/2022 112 (H)  70 - 99 mg/dL Final   Glucose reference range applies only to samples taken after fasting for at least 8 hours.   Glucose-Capillary 02/02/2022 125 (H)  70 - 99 mg/dL Final   Glucose reference range applies only to samples taken after fasting for at least 8 hours.   Glucose-Capillary 02/02/2022 274 (H)  70 - 99 mg/dL Final   Glucose reference range applies only to samples taken after fasting for at least 8 hours.   Glucose-Capillary 02/02/2022 239 (H)  70 - 99 mg/dL Final   Glucose reference range applies only to samples taken after fasting for at least 8 hours.   Glucose-Capillary 02/02/2022 190 (H)  70 - 99 mg/dL Final   Glucose reference range applies only to samples taken after fasting for at least 8 hours.   Glucose-Capillary 02/02/2022 183 (H)  70 - 99 mg/dL Final   Glucose reference range applies only to samples taken after fasting for at least 8 hours.   Glucose-Capillary 02/03/2022 113 (H)  70 - 99 mg/dL Final   Glucose reference range applies only to samples taken after fasting for  at least 8 hours.   Glucose-Capillary 02/03/2022 158 (H)  70 - 99 mg/dL Final   Glucose reference range applies only to samples taken after fasting for at least 8 hours.   Glucose-Capillary 02/03/2022 100 (H)  70 - 99 mg/dL Final   Glucose reference range applies only to samples taken after fasting for at least 8 hours.   Glucose-Capillary 02/03/2022 162 (H)  70 - 99 mg/dL Final   Glucose reference range applies only to samples taken after fasting for at least 8 hours.   Glucose-Capillary 02/03/2022 114 (H)  70 - 99 mg/dL Final   Glucose reference range applies only to samples taken after fasting for at least 8 hours.   Glucose-Capillary 02/04/2022 138 (H)  70 - 99 mg/dL Final   Glucose reference range applies only to samples taken after fasting for at least 8 hours.   Glucose-Capillary 02/04/2022 157 (H)  70 - 99 mg/dL Final   Glucose reference range applies only to samples taken after fasting for at least 8 hours.   Glucose-Capillary 02/04/2022 208 (H)  70 - 99 mg/dL Final   Glucose reference range applies only to samples taken after fasting for at least 8 hours.   Glucose-Capillary 02/04/2022 212 (H)  70 - 99 mg/dL Final   Glucose reference range applies only to samples taken after fasting for at least 8 hours.   Glucose-Capillary 02/05/2022 184 (H)  70 - 99 mg/dL Final   Glucose reference range applies only to samples taken after fasting for at least 8 hours.   Glucose-Capillary 02/05/2022 192 (H)  70 - 99 mg/dL Final   Glucose reference range applies only to samples taken after fasting for at least 8 hours.   Glucose-Capillary 02/05/2022 128 (H)  70 - 99 mg/dL Final   Glucose reference range applies only to samples taken after fasting for at least 8 hours.   Glucose-Capillary 02/05/2022 165 (H)  70 - 99 mg/dL Final   Glucose reference range applies only to samples taken after fasting for at least 8 hours.   Glucose-Capillary 02/06/2022 111 (H)  70 - 99 mg/dL Final   Glucose reference  range applies only to samples taken after fasting for at least 8 hours.   Glucose-Capillary 02/05/2022 262 (H)  70 - 99 mg/dL Final   Glucose reference range applies only to samples  taken after fasting for at least 8 hours.  Admission on 01/20/2022, Discharged on 01/21/2022  Component Date Value Ref Range Status   SARS Coronavirus 2 by RT PCR 01/20/2022 NEGATIVE  NEGATIVE Final   Comment: (NOTE) SARS-CoV-2 target nucleic acids are NOT DETECTED.  The SARS-CoV-2 RNA is generally detectable in upper respiratory specimens during the acute phase of infection. The lowest concentration of SARS-CoV-2 viral copies this assay can detect is 138 copies/mL. A negative result does not preclude SARS-Cov-2 infection and should not be used as the sole basis for treatment or other patient management decisions. A negative result may occur with  improper specimen collection/handling, submission of specimen other than nasopharyngeal swab, presence of viral mutation(s) within the areas targeted by this assay, and inadequate number of viral copies(<138 copies/mL). A negative result must be combined with clinical observations, patient history, and epidemiological information. The expected result is Negative.  Fact Sheet for Patients:  EntrepreneurPulse.com.au  Fact Sheet for Healthcare Providers:  IncredibleEmployment.be  This test is no                          t yet approved or cleared by the [place] FDA and  has been authorized for detection and/or diagnosis of SARS-CoV-2 by FDA under an Emergency Use Authorization (EUA). This EUA will remain  in effect (meaning this test can be used) for the duration of the COVID-19 declaration under Section 564(b)(1) of the Act, 21 U.S.C.section 360bbb-3(b)(1), unless the authorization is terminated  or revoked sooner.       Influenza A by PCR 01/20/2022 NEGATIVE  NEGATIVE Final   Influenza B by PCR 01/20/2022 NEGATIVE   NEGATIVE Final   Comment: (NOTE) The Xpert Xpress SARS-CoV-2/FLU/RSV plus assay is intended as an aid in the diagnosis of influenza from Nasopharyngeal swab specimens and should not be used as a sole basis for treatment. Nasal washings and aspirates are unacceptable for Xpert Xpress SARS-CoV-2/FLU/RSV testing.  Fact Sheet for Patients: EntrepreneurPulse.com.au  Fact Sheet for Healthcare Providers: IncredibleEmployment.be  This test is not yet approved or cleared by the [place] FDA and has been authorized for detection and/or diagnosis of SARS-CoV-2 by FDA under an Emergency Use Authorization (EUA). This EUA will remain in effect (meaning this test can be used) for the duration of the COVID-19 declaration under Section 564(b)(1) of the Act, 21 U.S.C. section 360bbb-3(b)(1), unless the authorization is terminated or revoked.  Performed at [facility], [place] [address], [place], [place] [address]    Sodium 01/20/2022 136  135 - 145 mmol/L Final   Potassium 01/20/2022 4.0  3.5 - 5.1 mmol/L Final   Chloride 01/20/2022 103  98 - 111 mmol/L Final   CO2 01/20/2022 24  22 - 32 mmol/L Final   Glucose, Bld 01/20/2022 360 (H)  70 - 99 mg/dL Final   Glucose reference range applies only to samples taken after fasting for at least 8 hours.   BUN 01/20/2022 12  8 - 23 mg/dL Final   Creatinine, Ser 01/20/2022 1.01  0.61 - 1.24 mg/dL Final   Calcium 01/20/2022 9.4  8.9 - 10.3 mg/dL Final   Total Protein 01/20/2022 7.3  6.5 - 8.1 g/dL Final   Albumin 01/20/2022 3.4 (L)  3.5 - 5.0 g/dL Final   AST 01/20/2022 14 (L)  15 - 41 U/L Final   ALT 01/20/2022 10  0 - 44 U/L Final   Alkaline Phosphatase 01/20/2022 101  38 -  126 U/L Final   Total Bilirubin 01/20/2022 0.3  0.3 - 1.2 mg/dL Final   GFR, Estimated 01/20/2022 >60  >60 mL/min Final   Comment: (NOTE) Calculated using the CKD-EPI Creatinine Equation (2021)    Anion gap 01/20/2022 9  5 - 15 Final    Performed at [facility], [place] [address] [place], [place] [address]   Alcohol, Ethyl (B) 01/20/2022 <10  <10 mg/dL Final   Comment: (NOTE) Lowest detectable limit for serum alcohol is 10 mg/dL.  For medical purposes only. Performed at [facility], [place] [address] [place], [place] [address]    Opiates 01/20/2022 NONE DETECTED  NONE DETECTED Final   Cocaine 01/20/2022 POSITIVE (A)  NONE DETECTED Final   Benzodiazepines 01/20/2022 NONE DETECTED  NONE DETECTED Final   Amphetamines 01/20/2022 NONE DETECTED  NONE DETECTED Final   Tetrahydrocannabinol 01/20/2022 NONE DETECTED  NONE DETECTED Final   Barbiturates 01/20/2022 NONE DETECTED  NONE DETECTED Final   Comment: (NOTE) DRUG SCREEN FOR MEDICAL PURPOSES ONLY.  IF CONFIRMATION IS NEEDED FOR ANY PURPOSE, NOTIFY LAB WITHIN 5 DAYS.  LOWEST DETECTABLE LIMITS FOR URINE DRUG SCREEN Drug Class                     Cutoff (ng/mL) Amphetamine and metabolites    1000 Barbiturate and metabolites    200 Benzodiazepine                 200 Opiates and metabolites        300 Cocaine and metabolites        300 THC                            50 Performed at [facility], [place] [address], [place], [place] [address]    WBC 01/20/2022 5.2  4.0 - 10.5 K/uL Final   RBC 01/20/2022 4.73  4.22 - 5.81 MIL/uL Final   Hemoglobin 01/20/2022 14.3  13.0 - 17.0 g/dL Final   HCT 01/20/2022 43.1  39.0 - 52.0 % Final   MCV 01/20/2022 91.1  80.0 - 100.0 fL Final   MCH 01/20/2022 30.2  26.0 - 34.0 pg Final   MCHC 01/20/2022 33.2  30.0 - 36.0 g/dL Final   RDW 01/20/2022 14.1  11.5 - 15.5 % Final   Platelets 01/20/2022 444 (H)  150 - 400 K/uL Final   nRBC 01/20/2022 0.0  0.0 - 0.2 % Final   Neutrophils Relative % 01/20/2022 61  % Final   Neutro Abs 01/20/2022 3.2  1.7 - 7.7 K/uL Final   Lymphocytes Relative 01/20/2022 29  % Final   Lymphs Abs 01/20/2022 1.5  0.7 - 4.0 K/uL Final   Monocytes Relative 01/20/2022 7  % Final   Monocytes Absolute  01/20/2022 0.4  0.1 - 1.0 K/uL Final   Eosinophils Relative 01/20/2022 2  % Final   Eosinophils Absolute 01/20/2022 0.1  0.0 - 0.5 K/uL Final   Basophils Relative 01/20/2022 1  % Final   Basophils Absolute 01/20/2022 0.0  0.0 - 0.1 K/uL Final   Immature Granulocytes 01/20/2022 0  % Final   Abs Immature Granulocytes 01/20/2022 0.01  0.00 - 0.07 K/uL Final   Performed at [facility], [place] [address], [place], [place] [address]   Salicylate Lvl 91/50/5697 <7.0 (L)  7.0 - 30.0 mg/dL Final   Performed at [place],  [place] [address]   Acetaminophen (Tylenol), Serum 01/20/2022 <10 (L)  10 - 30 ug/mL Final   Comment: (NOTE) Therapeutic concentrations vary significantly. A range of 10-30 ug/mL  may be an effective concentration for many patients. However, some  are best treated at concentrations outside of this range. Acetaminophen concentrations >150 ug/mL at 4 hours after ingestion  and >50 ug/mL at 12 hours after ingestion are often associated with  toxic reactions.  Performed at [facility], [place] [address], [place],  [address]    Glucose-Capillary 01/20/2022 344 (H)  70 - 99 mg/dL Final   Glucose reference range applies only to samples taken after fasting for at least 8 hours.   Comment 1 01/20/2022 Notify RN   Final   Comment 2 01/20/2022 Document in Chart   Final   Glucose-Capillary 01/20/2022 479 (H)  70 - 99 mg/dL Final   Glucose reference range applies only to samples taken after fasting for at least 8 hours.   Glucose-Capillary 01/21/2022 243 (H)  70 - 99 mg/dL Final   Glucose reference range applies only to samples taken after fasting for at least 8 hours.   Glucose-Capillary 01/21/2022 224 (H)  70 - 99 mg/dL Final   Glucose reference range applies only to samples taken after fasting for at least 8 hours.   Glucose-Capillary 01/21/2022 282 (H)  70 - 99 mg/dL Final   Glucose reference range applies only to samples taken after  fasting for at least 8 hours.  Admission on 01/18/2022, Discharged on 01/19/2022  Component Date Value Ref Range Status   WBC 01/18/2022 5.3  4.0 - 10.5 K/uL Final   RBC 01/18/2022 4.47  4.22 - 5.81 MIL/uL Final   Hemoglobin 01/18/2022 13.7  13.0 - 17.0 g/dL Final   HCT 01/18/2022 41.0  39.0 - 52.0 % Final   MCV 01/18/2022 91.7  80.0 - 100.0 fL Final   MCH 01/18/2022 30.6  26.0 - 34.0 pg Final   MCHC 01/18/2022 33.4  30.0 - 36.0 g/dL Final   RDW 01/18/2022 14.2  11.5 - 15.5 % Final   Platelets 01/18/2022 445 (H)  150 - 400 K/uL Final   nRBC 01/18/2022 0.0  0.0 - 0.2 % Final   Performed at [facility], [place] [address] [place], [place] [address]   Sodium 01/18/2022 136  135 - 145 mmol/L Final   Potassium 01/18/2022 4.3  3.5 - 5.1 mmol/L Final   Chloride 01/18/2022 100  98 - 111 mmol/L Final   CO2 01/18/2022 22  22 - 32 mmol/L Final   Glucose, Bld 01/18/2022 499 (H)  70 - 99 mg/dL Final   Glucose reference range applies only to samples taken after fasting for at least 8 hours.   BUN 01/18/2022 9  8 - 23 mg/dL Final   Creatinine, Ser 01/18/2022 1.23  0.61 - 1.24 mg/dL Final   Calcium 01/18/2022 9.1  8.9 - 10.3 mg/dL Final   Total Protein 01/18/2022 6.6  6.5 - 8.1 g/dL Final   Albumin 01/18/2022 3.2 (L)  3.5 - 5.0 g/dL Final   AST 01/18/2022 18  15 - 41 U/L Final   ALT 01/18/2022 9  0 - 44 U/L Final   Alkaline Phosphatase 01/18/2022 95  38 - 126 U/L Final   Total Bilirubin 01/18/2022 0.3  0.3 - 1.2 mg/dL Final   GFR, Estimated 01/18/2022 >60  >60 mL/min Final   Comment: (NOTE) Calculated using the CKD-EPI Creatinine Equation (2021)    Anion  gap 01/18/2022 14  5 - 15 Final   Performed at [facility], [place] [address] [place], [place] [address]   Color, Urine 01/18/2022 YELLOW  YELLOW Final   APPearance 01/18/2022 CLEAR  CLEAR Final   Specific Gravity, Urine 01/18/2022 1.025  1.005 - 1.030 Final   pH 01/18/2022 6.0  5.0 - 8.0 Final   Glucose, UA 01/18/2022 >=500 (A)   NEGATIVE mg/dL Final   Hgb urine dipstick 01/18/2022 NEGATIVE  NEGATIVE Final   Bilirubin Urine 01/18/2022 NEGATIVE  NEGATIVE Final   Ketones, ur 01/18/2022 NEGATIVE  NEGATIVE mg/dL Final   Protein, ur 01/18/2022 NEGATIVE  NEGATIVE mg/dL Final   Nitrite 01/18/2022 NEGATIVE  NEGATIVE Final   Leukocytes,Ua 01/18/2022 SMALL (A)  NEGATIVE Final   RBC / HPF 01/18/2022 0-5  0 - 5 RBC/hpf Final   WBC, UA 01/18/2022 21-50  0 - 5 WBC/hpf Final   Bacteria, UA 01/18/2022 RARE (A)  NONE SEEN Final   Squamous Epithelial / LPF 01/18/2022 0-5  0 - 5 Final   Budding Yeast 01/18/2022 PRESENT   Final   Performed at [facility], [place] [address], [place], [place] [address]   Opiates 01/18/2022 NONE DETECTED  NONE DETECTED Final   Cocaine 01/18/2022 POSITIVE (A)  NONE DETECTED Final   Benzodiazepines 01/18/2022 NONE DETECTED  NONE DETECTED Final   Amphetamines 01/18/2022 NONE DETECTED  NONE DETECTED Final   Tetrahydrocannabinol 01/18/2022 NONE DETECTED  NONE DETECTED Final   Barbiturates 01/18/2022 NONE DETECTED  NONE DETECTED Final   Comment: (NOTE) DRUG SCREEN FOR MEDICAL PURPOSES ONLY.  IF CONFIRMATION IS NEEDED FOR ANY PURPOSE, NOTIFY LAB WITHIN 5 DAYS.  LOWEST DETECTABLE LIMITS FOR URINE DRUG SCREEN Drug Class                     Cutoff (ng/mL) Amphetamine and metabolites    1000 Barbiturate and metabolites    200 Benzodiazepine                 200 Opiates and metabolites        300 Cocaine and metabolites        300 THC                            50 Performed at [facility], [place] [address], [place], [place] [address]    Glucose-Capillary 01/18/2022 483 (H)  70 - 99 mg/dL Final   Glucose reference range applies only to samples taken after fasting for at least 8 hours.   Glucose-Capillary 01/19/2022 586 (HH)  70 - 99 mg/dL Final   Glucose reference range applies only to samples taken after fasting for at least 8 hours.   Glucose-Capillary 01/19/2022 456 (H)  70 - 99 mg/dL Final    Glucose reference range applies only to samples taken after fasting for at least 8 hours.   Glucose-Capillary 01/19/2022 515 (HH)  70 - 99 mg/dL Final   Glucose reference range applies only to samples taken after fasting for at least 8 hours.   Comment 1 01/19/2022 Notify RN   Final   Glucose-Capillary 01/19/2022 164 (H)  70 - 99 mg/dL Final   Glucose reference range applies only to samples taken after fasting for at least 8 hours.  Admission on 01/07/2022, Discharged on 01/07/2022  Component Date Value Ref Range Status   Glucose-Capillary 01/07/2022 267 (H)  70 - 99 mg/dL Final   Glucose  reference range applies only to samples taken after fasting for at least 8 hours.  Admission on 01/02/2022, Discharged on 01/02/2022  Component Date Value Ref Range Status   WBC 01/02/2022 7.0  4.0 - 10.5 K/uL Final   RBC 01/02/2022 4.54  4.22 - 5.81 MIL/uL Final   Hemoglobin 01/02/2022 13.9  13.0 - 17.0 g/dL Final   HCT 01/02/2022 41.4  39.0 - 52.0 % Final   MCV 01/02/2022 91.2  80.0 - 100.0 fL Final   MCH 01/02/2022 30.6  26.0 - 34.0 pg Final   MCHC 01/02/2022 33.6  30.0 - 36.0 g/dL Final   RDW 01/02/2022 14.1  11.5 - 15.5 % Final   Platelets 01/02/2022 369  150 - 400 K/uL Final   nRBC 01/02/2022 0.0  0.0 - 0.2 % Final   Neutrophils Relative % 01/02/2022 59  % Final   Neutro Abs 01/02/2022 4.1  1.7 - 7.7 K/uL Final   Lymphocytes Relative 01/02/2022 28  % Final   Lymphs Abs 01/02/2022 1.9  0.7 - 4.0 K/uL Final   Monocytes Relative 01/02/2022 8  % Final   Monocytes Absolute 01/02/2022 0.5  0.1 - 1.0 K/uL Final   Eosinophils Relative 01/02/2022 4  % Final   Eosinophils Absolute 01/02/2022 0.3  0.0 - 0.5 K/uL Final   Basophils Relative 01/02/2022 1  % Final   Basophils Absolute 01/02/2022 0.1  0.0 - 0.1 K/uL Final   Immature Granulocytes 01/02/2022 0  % Final   Abs Immature Granulocytes 01/02/2022 0.02  0.00 - 0.07 K/uL Final   Performed at [facility], [place] [address], [place], [place]  [address]   Sodium 01/02/2022 138  135 - 145 mmol/L Final   Potassium 01/02/2022 4.1  3.5 - 5.1 mmol/L Final   Chloride 01/02/2022 104  98 - 111 mmol/L Final   CO2 01/02/2022 25  22 - 32 mmol/L Final   Glucose, Bld 01/02/2022 216 (H)  70 - 99 mg/dL Final   Glucose reference range applies only to samples taken after fasting for at least 8 hours.   BUN 01/02/2022 11  8 - 23 mg/dL Final   Creatinine, Ser 01/02/2022 0.98  0.61 - 1.24 mg/dL Final   Calcium 01/02/2022 9.5  8.9 - 10.3 mg/dL Final   Total Protein 01/02/2022 7.4  6.5 - 8.1 g/dL Final   Albumin 01/02/2022 3.4 (L)  3.5 - 5.0 g/dL Final   AST 01/02/2022 12 (L)  15 - 41 U/L Final   ALT 01/02/2022 11  0 - 44 U/L Final   Alkaline Phosphatase 01/02/2022 90  38 - 126 U/L Final   Total Bilirubin 01/02/2022 0.9  0.3 - 1.2 mg/dL Final   GFR, Estimated 01/02/2022 >60  >60 mL/min Final   Comment: (NOTE) Calculated using the CKD-EPI Creatinine Equation (2021)    Anion gap 01/02/2022 9  5 - 15 Final   Performed at [facility] [address] [place], [place] [address]  [facility] on 11/01/2021  Component Date Value Ref Range Status   POC Glucose 11/27/2021 301 (A)  70 - 99 mg/dl Final   1 hr after lunch, had not taken insulin  Admission on 10/13/2021, Discharged on 10/15/2021  Component Date Value Ref Range Status   Glucose-Capillary 10/13/2021 388 (H)  70 - 99 mg/dL Final   Glucose reference range applies only to samples taken after fasting for at least 8 hours.   SARS Coronavirus 2 by RT PCR 10/13/2021 NEGATIVE  NEGATIVE  Final   Comment: (NOTE) SARS-CoV-2 target nucleic acids are NOT DETECTED.  The SARS-CoV-2 RNA is generally detectable in upper respiratory specimens during the acute phase of infection. The lowest concentration of SARS-CoV-2 viral copies this assay can detect is 138 copies/mL. A negative result does not preclude SARS-Cov-2 infection and should not be used as the sole basis for treatment  or other patient management decisions. A negative result may occur with  improper specimen collection/handling, submission of specimen other than nasopharyngeal swab, presence of viral mutation(s) within the areas targeted by this assay, and inadequate number of viral copies(<138 copies/mL). A negative result must be combined with clinical observations, patient history, and epidemiological information. The expected result is Negative.  Fact Sheet for Patients:  EntrepreneurPulse.com.au  Fact Sheet for Healthcare Providers:  IncredibleEmployment.be  This test is no                          t yet approved or cleared by the [place] FDA and  has been authorized for detection and/or diagnosis of SARS-CoV-2 by FDA under an Emergency Use Authorization (EUA). This EUA will remain  in effect (meaning this test can be used) for the duration of the COVID-19 declaration under Section 564(b)(1) of the Act, 21 U.S.C.section 360bbb-3(b)(1), unless the authorization is terminated  or revoked sooner.       Influenza A by PCR 10/13/2021 NEGATIVE  NEGATIVE Final   Influenza B by PCR 10/13/2021 NEGATIVE  NEGATIVE Final   Comment: (NOTE) The Xpert Xpress SARS-CoV-2/FLU/RSV plus assay is intended as an aid in the diagnosis of influenza from Nasopharyngeal swab specimens and should not be used as a sole basis for treatment. Nasal washings and aspirates are unacceptable for Xpert Xpress SARS-CoV-2/FLU/RSV testing.  Fact Sheet for Patients: EntrepreneurPulse.com.au  Fact Sheet for Healthcare Providers: IncredibleEmployment.be  This test is not yet approved or cleared by the [place] FDA and has been authorized for detection and/or diagnosis of SARS-CoV-2 by FDA under an Emergency Use Authorization (EUA). This EUA will remain in effect (meaning this test can be used) for the duration of the COVID-19 declaration under  Section 564(b)(1) of the Act, 21 U.S.C. section 360bbb-3(b)(1), unless the authorization is terminated or revoked.  Performed at [facility], [place] [address], [place], [place] [address]    WBC 10/13/2021 9.5  4.0 - 10.5 K/uL Final   RBC 10/13/2021 4.23  4.22 - 5.81 MIL/uL Final   Hemoglobin 10/13/2021 13.0  13.0 - 17.0 g/dL Final   HCT 10/13/2021 40.4  39.0 - 52.0 % Final   MCV 10/13/2021 95.5  80.0 - 100.0 fL Final   MCH 10/13/2021 30.7  26.0 - 34.0 pg Final   MCHC 10/13/2021 32.2  30.0 - 36.0 g/dL Final   RDW 10/13/2021 13.4  11.5 - 15.5 % Final   Platelets 10/13/2021 551 (H)  150 - 400 K/uL Final   nRBC 10/13/2021 0.0  0.0 - 0.2 % Final   Neutrophils Relative % 10/13/2021 68  % Final   Neutro Abs 10/13/2021 6.5  1.7 - 7.7 K/uL Final   Lymphocytes Relative 10/13/2021 18  % Final   Lymphs Abs 10/13/2021 1.7  0.7 - 4.0 K/uL Final   Monocytes Relative 10/13/2021 9  % Final   Monocytes Absolute 10/13/2021 0.8  0.1 - 1.0 K/uL Final   Eosinophils Relative 10/13/2021 3  % Final   Eosinophils Absolute 10/13/2021 0.3  0.0 - 0.5 K/uL Final  Basophils Relative 10/13/2021 1  % Final   Basophils Absolute 10/13/2021 0.1  0.0 - 0.1 K/uL Final   Immature Granulocytes 10/13/2021 1  % Final   Abs Immature Granulocytes 10/13/2021 0.11 (H)  0.00 - 0.07 K/uL Final   Performed at [facility], [place] [address] [place], [place] [address]   Sodium 10/13/2021 136  135 - 145 mmol/L Final   Potassium 10/13/2021 4.3  3.5 - 5.1 mmol/L Final   Chloride 10/13/2021 104  98 - 111 mmol/L Final   CO2 10/13/2021 24  22 - 32 mmol/L Final   Glucose, Bld 10/13/2021 296 (H)  70 - 99 mg/dL Final   Glucose reference range applies only to samples taken after fasting for at least 8 hours.   BUN 10/13/2021 14  8 - 23 mg/dL Final   Creatinine, Ser 10/13/2021 1.15  0.61 - 1.24 mg/dL Final   Calcium 10/13/2021 9.0  8.9 - 10.3 mg/dL Final   Total Protein 10/13/2021 7.5  6.5 - 8.1 g/dL Final   Albumin 10/13/2021  3.0 (L)  3.5 - 5.0 g/dL Final   AST 10/13/2021 13 (L)  15 - 41 U/L Final   ALT 10/13/2021 10  0 - 44 U/L Final   Alkaline Phosphatase 10/13/2021 84  38 - 126 U/L Final   Total Bilirubin 10/13/2021 0.5  0.3 - 1.2 mg/dL Final   GFR, Estimated 10/13/2021 >60  >60 mL/min Final   Comment: (NOTE) Calculated using the CKD-EPI Creatinine Equation (2021)    Anion gap 10/13/2021 8  5 - 15 Final   Performed at [facility], [place] [address], [place], [place] [address]   Color, Urine 10/13/2021 YELLOW  YELLOW Final   APPearance 10/13/2021 CLOUDY (A)  CLEAR Final   Specific Gravity, Urine 10/13/2021 1.022  1.005 - 1.030 Final   pH 10/13/2021 5.0  5.0 - 8.0 Final   Glucose, UA 10/13/2021 >=500 (A)  NEGATIVE mg/dL Final   Hgb urine dipstick 10/13/2021 LARGE (A)  NEGATIVE Final   Bilirubin Urine 10/13/2021 NEGATIVE  NEGATIVE Final   Ketones, ur 10/13/2021 NEGATIVE  NEGATIVE mg/dL Final   Protein, ur 10/13/2021 30 (A)  NEGATIVE mg/dL Final   Nitrite 10/13/2021 NEGATIVE  NEGATIVE Final   Leukocytes,Ua 10/13/2021 LARGE (A)  NEGATIVE Final   RBC / HPF 10/13/2021 >50 (H)  0 - 5 RBC/hpf Final   WBC, UA 10/13/2021 >50 (H)  0 - 5 WBC/hpf Final   Bacteria, UA 10/13/2021 RARE (A)  NONE SEEN Final   Squamous Epithelial / LPF 10/13/2021 0-5  0 - 5 Final   WBC Clumps 10/13/2021 PRESENT   Final   Mucus 10/13/2021 PRESENT   Final   Budding Yeast 10/13/2021 PRESENT   Final   Non Squamous Epithelial 10/13/2021 0-5 (A)  NONE SEEN Final   Performed at [facility], [place] [address] [place], [place] [address]   pH, Ven 10/13/2021 7.457 (H)  7.25 - 7.43 Final   pCO2, Ven 10/13/2021 38.1 (L)  44 - 60 mmHg Final   pO2, Ven 10/13/2021 174 (H)  32 - 45 mmHg Final   Bicarbonate 10/13/2021 26.9  20.0 - 28.0 mmol/L Final   TCO2 10/13/2021 28  22 - 32 mmol/L Final   O2 Saturation 10/13/2021 100  % Final   Acid-Base Excess 10/13/2021 3.0 (H)  0.0 - 2.0 mmol/L Final   Sodium 10/13/2021 137  135 - 145 mmol/L Final    Potassium 10/13/2021 4.3  3.5 -  5.1 mmol/L Final   Calcium, Ion 10/13/2021 1.10 (L)  1.15 - 1.40 mmol/L Final   HCT 10/13/2021 40.0  39.0 - 52.0 % Final   Hemoglobin 10/13/2021 13.6  13.0 - 17.0 g/dL Final   Sample type 10/13/2021 VENOUS   Final   Opiates 10/13/2021 NONE DETECTED  NONE DETECTED Final   Cocaine 10/13/2021 POSITIVE (A)  NONE DETECTED Final   Benzodiazepines 10/13/2021 NONE DETECTED  NONE DETECTED Final   Amphetamines 10/13/2021 NONE DETECTED  NONE DETECTED Final   Tetrahydrocannabinol 10/13/2021 NONE DETECTED  NONE DETECTED Final   Barbiturates 10/13/2021 NONE DETECTED  NONE DETECTED Final   Comment: (NOTE) DRUG SCREEN FOR MEDICAL PURPOSES ONLY.  IF CONFIRMATION IS NEEDED FOR ANY PURPOSE, NOTIFY LAB WITHIN 5 DAYS.  LOWEST DETECTABLE LIMITS FOR URINE DRUG SCREEN Drug Class                     Cutoff (ng/mL) Amphetamine and metabolites    1000 Barbiturate and metabolites    200 Benzodiazepine                 701 Tricyclics and metabolites     300 Opiates and metabolites        300 Cocaine and metabolites        300 THC                            50 Performed at [facility], [place] [address] [place], [place] [address]    Adenovirus 10/13/2021 NOT DETECTED  NOT DETECTED Final   Coronavirus 229E 10/13/2021 NOT DETECTED  NOT DETECTED Final   Comment: (NOTE) The Coronavirus on the Respiratory Panel, DOES NOT test for the novel  Coronavirus (2019 nCoV)    Coronavirus HKU1 10/13/2021 NOT DETECTED  NOT DETECTED Final   Coronavirus NL63 10/13/2021 NOT DETECTED  NOT DETECTED Final   Coronavirus OC43 10/13/2021 NOT DETECTED  NOT DETECTED Final   Metapneumovirus 10/13/2021 NOT DETECTED  NOT DETECTED Final   Rhinovirus / Enterovirus 10/13/2021 NOT DETECTED  NOT DETECTED Final   Influenza A 10/13/2021 NOT DETECTED  NOT DETECTED Final   Influenza B 10/13/2021 NOT DETECTED  NOT DETECTED Final   Parainfluenza Virus 1 10/13/2021 NOT DETECTED  NOT DETECTED Final    Parainfluenza Virus 2 10/13/2021 NOT DETECTED  NOT DETECTED Final   Parainfluenza Virus 3 10/13/2021 NOT DETECTED  NOT DETECTED Final   Parainfluenza Virus 4 10/13/2021 NOT DETECTED  NOT DETECTED Final   Respiratory Syncytial Virus 10/13/2021 NOT DETECTED  NOT DETECTED Final   Bordetella pertussis 10/13/2021 NOT DETECTED  NOT DETECTED Final   Bordetella Parapertussis 10/13/2021 NOT DETECTED  NOT DETECTED Final   Chlamydophila pneumoniae 10/13/2021 NOT DETECTED  NOT DETECTED Final   Mycoplasma pneumoniae 10/13/2021 NOT DETECTED  NOT DETECTED Final   Performed at [facility], [place] [address] [place], [place] [address]   Procalcitonin 10/13/2021 <0.10  ng/mL Final   Comment:        Interpretation: PCT (Procalcitonin) <= 0.5 ng/mL: Systemic infection (sepsis) is not likely. Local bacterial infection is possible. (NOTE)       Sepsis PCT Algorithm           Lower Respiratory Tract  Infection PCT Algorithm    ----------------------------     ----------------------------         PCT < 0.25 ng/mL                PCT < 0.10 ng/mL          Strongly encourage             Strongly discourage   discontinuation of antibiotics    initiation of antibiotics    ----------------------------     -----------------------------       PCT 0.25 - 0.50 ng/mL            PCT 0.10 - 0.25 ng/mL               OR       >80% decrease in PCT            Discourage initiation of                                            antibiotics      Encourage discontinuation           of antibiotics    ----------------------------     -----------------------------         PCT >= 0.50 ng/mL              PCT 0.26 - 0.50 ng/mL               AND                                 <80% decrease in PCT             Encourage initiation of                                             antibiotics       Encourage continuation           of antibiotics    ----------------------------      -----------------------------        PCT >= 0.50 ng/mL                  PCT > 0.50 ng/mL               AND         increase in PCT                  Strongly encourage                                      initiation of antibiotics    Strongly encourage escalation           of antibiotics                                     -----------------------------  PCT <= 0.25 ng/mL                                                 OR                                        > 80% decrease in PCT                                      Discontinue / Do not initiate                                             antibiotics  Performed at [facility], [place] [address], [place], [place] [address]    B Natriuretic Peptide 10/13/2021 23.6  0.0 - 100.0 pg/mL Final   Performed at [place] [address], [place], [place] [address]   L. pneumophila Serogp 1 Ur Ag 10/13/2021 Negative  Negative Final   Comment: (NOTE) Presumptive negative for L. pneumophila serogroup 1 antigen in urine, suggesting no recent or current infection. Legionnaires' disease cannot be ruled out since other serogroups and species may also cause disease. Performed At: [facility] [place], [place] [id number] Rush Farmer MD [id number]    Source of Sample 10/13/2021 URINE, RANDOM   Final   Performed at [facility], [place] [address], [place], [place] [address]   Strep Pneumo Urinary Antigen 10/13/2021 NEGATIVE  NEGATIVE Final   Comment:        Infection due to S. pneumoniae cannot be absolutely ruled out since the antigen present may be below the detection limit of the test. Performed at [facility], [address] [place], [place] [address]    Sodium 10/14/2021 138  135 - 145 mmol/L Final   Potassium 10/14/2021 4.3  3.5 - 5.1 mmol/L Final   Chloride 10/14/2021 106  98 - 111 mmol/L Final   CO2 10/14/2021 25  22 - 32 mmol/L Final    Glucose, Bld 10/14/2021 286 (H)  70 - 99 mg/dL Final   Glucose reference range applies only to samples taken after fasting for at least 8 hours.   BUN 10/14/2021 14  8 - 23 mg/dL Final   Creatinine, Ser 10/14/2021 0.98  0.61 - 1.24 mg/dL Final   Calcium 10/14/2021 8.9  8.9 - 10.3 mg/dL Final   GFR, Estimated 10/14/2021 >60  >60 mL/min Final   Comment: (NOTE) Calculated using the CKD-EPI Creatinine Equation (2021)    Anion gap 10/14/2021 7  5 - 15 Final   Performed at [facility], [place] [address] [place], [place] [address]   WBC 10/14/2021 9.6  4.0 - 10.5 K/uL Final   RBC 10/14/2021 4.12 (L)  4.22 - 5.81 MIL/uL Final   Hemoglobin 10/14/2021 12.6 (L)  13.0 - 17.0 g/dL Final   HCT 10/14/2021 38.8 (L)  39.0 - 52.0 % Final   MCV 10/14/2021 94.2  80.0 - 100.0 fL Final   MCH 10/14/2021 30.6  26.0 - 34.0 pg  Final   MCHC 10/14/2021 32.5  30.0 - 36.0 g/dL Final   RDW 10/14/2021 13.4  11.5 - 15.5 % Final   Platelets 10/14/2021 556 (H)  150 - 400 K/uL Final   nRBC 10/14/2021 0.0  0.0 - 0.2 % Final   Performed at [facility], [place] [address], [place], [place] [address]   Glucose-Capillary 10/13/2021 281 (H)  70 - 99 mg/dL Final   Glucose reference range applies only to samples taken after fasting for at least 8 hours.   Glucose-Capillary 10/13/2021 399 (H)  70 - 99 mg/dL Final   Glucose reference range applies only to samples taken after fasting for at least 8 hours.   Glucose-Capillary 10/14/2021 239 (H)  70 - 99 mg/dL Final   Glucose reference range applies only to samples taken after fasting for at least 8 hours.   Glucose-Capillary 10/14/2021 345 (H)  70 - 99 mg/dL Final   Glucose reference range applies only to samples taken after fasting for at least 8 hours.   Glucose-Capillary 10/14/2021 383 (H)  70 - 99 mg/dL Final   Glucose reference range applies only to samples taken after fasting for at least 8 hours.   Glucose-Capillary 10/15/2021 194 (H)  70 - 99 mg/dL Final   Glucose  reference range applies only to samples taken after fasting for at least 8 hours.   Glucose-Capillary 10/15/2021 161 (H)  70 - 99 mg/dL Final   Glucose reference range applies only to samples taken after fasting for at least 8 hours.   Glucose-Capillary 10/15/2021 312 (H)  70 - 99 mg/dL Final   Glucose reference range applies only to samples taken after fasting for at least 8 hours.   Glucose-Capillary 10/14/2021 279 (H)  70 - 99 mg/dL Final   Glucose reference range applies only to samples taken after fasting for at least 8 hours.  There may be more visits with results that are not included.    Blood Alcohol level:  Lab Results  Component Value Date   ETH <10 03/02/2022   ETH <10 60/45/4098    Metabolic Disorder Labs: Lab Results  Component Value Date   HGBA1C 10.4 (H) 01/21/2022   MPG 251.78 01/21/2022   MPG >398 02/24/2021   No results found for: "PROLACTIN" Lab Results  Component Value Date   CHOL 213 (H) 05/23/2021   TRIG 115 05/23/2021   HDL 71 05/23/2021   CHOLHDL 3.2 11/03/2019   VLDL 32 (H) 05/03/2015   LDLCALC 122 (H) 05/23/2021   LDLCALC 99 11/03/2019    Therapeutic Lab Levels: No results found for: "LITHIUM" No results found for: "VALPROATE" No results found for: "CBMZ"  Physical Findings   GAD-7    Flowsheet Row Office Visit from 10/08/2021 in [place] Office Visit from 09/05/2021 in [facility] [facility] Office Visit from 12/26/2020 in [place] Office Visit from 11/17/2018 in [facility] Office Visit from 06/05/2017 in [place]  Total GAD-7 Score _0 0 12      PHQ2-9    [place] ED from 03/02/2022 in [facility] ED from 01/21/2022 in [facility] Office Visit from 10/08/2021 in [place] Office Visit from 09/05/2021 in [facility] ED from 07/02/2021 in [facility]  PHQ-2 Total Score 1 0 6 3 2  PHQ-9 Total Score 2 -- _0 Flowsheet Row ED from 03/02/2022 in [facility] Most recent reading at 03/03/2022  1:19 AM ED from 03/01/2022 in [place] Most recent reading at 03/01/2022  5:43 PM ED from 03/01/2022 in [place] Most recent reading at 03/01/2022 12:10 AM  C-SSRS RISK CATEGORY No Risk No Risk No Risk        Musculoskeletal  Strength & Muscle Tone: within normal limits Gait & Station: normal Patient leans: Front  Psychiatric Specialty Exam  Presentation  General Appearance:  Appropriate for Environment; Casual  Eye Contact: Good  Speech: Clear and Coherent; Normal Rate  Speech Volume: Normal  Handedness: Right   Mood and Affect  Mood: Dysphoric  Affect: Congruent   Thought Process  Thought Processes: Coherent; Goal Directed  Descriptions of Associations:Intact  Orientation:Full (Time, Place and Person)  Thought Content:WDL; Logical  Diagnosis of Schizophrenia or Schizoaffective disorder in past: No    Hallucinations:Hallucinations: None  Ideas of Reference:None  Suicidal Thoughts:Suicidal Thoughts: No  Homicidal Thoughts:Homicidal Thoughts: No   Sensorium  Memory: Immediate Good; Recent Good  Judgment: Fair  Insight: Fair   Community education officer  Concentration: Good  Attention Span: Good  Recall: Good  Fund of Knowledge: Good  Language: Good   Psychomotor Activity  Psychomotor Activity: Psychomotor Activity: Normal   Assets  Assets: Communication Skills; Desire for Improvement; Resilience   Sleep  Sleep: Sleep: Fair Number of Hours of Sleep: -1   Nutritional Assessment (For OBS and FBC admissions only) Has the patient had a weight loss or gain of 10 pounds or more in the last 3  months?: No Has the patient had a decrease in food intake/or appetite?: No Does the patient have dental problems?: No Does the patient have eating habits or behaviors that may be indicators of an eating disorder including binging or inducing vomiting?: No Has the patient recently lost weight without trying?: 0 Has the patient been eating poorly because of a decreased appetite?: 0 Malnutrition Screening Tool Score: 0    Physical Exam  Physical Exam Vitals and nursing note reviewed.  Constitutional:      General: He is not in acute distress.    Appearance: He is normal weight. He is not ill-appearing or toxic-appearing.  HENT:     Head: Normocephalic and atraumatic.  Pulmonary:     Effort: Pulmonary effort is normal.  Musculoskeletal:        General: Normal range of motion.  Neurological:     General: No focal deficit present.     Mental Status: He is alert.    Review of Systems  Respiratory:  Negative for cough and shortness of breath.   Cardiovascular:  Negative for chest pain.  Gastrointestinal:  Negative for abdominal pain, constipation, diarrhea, nausea and vomiting.  Neurological:  Negative for dizziness, weakness and headaches.  Psychiatric/Behavioral:  Negative for depression, hallucinations and suicidal ideas. The patient is not nervous/anxious and does not have insomnia.    Blood pressure (!) 153/91, pulse 70, temperature 97.8 F (36.6 C), temperature source Oral, resp. rate 18, SpO2 99 %. There is no height or weight on file to calculate BMI.  Treatment Plan Summary: Daily contact with patient to assess and evaluate symptoms and progress in treatment and Medication management  Camar Guyton is a 61 yr old male who presented to [facility] on 12/22 requesting Detox from [place], he  was admitted to [facility] on 12/24.  PPHx is significant for Substance Induced Mood Disorder, Cocaine Use Disorder, tobacco use disorder, and no Psychiatric Hospitalizations or Suicide Attempts.   PMHx Is significant for DM2 + neuropathy, HTN, HLD, and housing instability.   Marya Amsler is tolerating detox well without significant withdrawal symptoms so far.  His blood sugar continues to be mildly elevated so he has been started on the Sliding Scale to ensure adequate control.  He is concerned about being arrested again for his past warrant so will appreciate Social Works assistance in determining next best steps when we are approaching D/C.  We will not make any other changes to his medications at this time.  We will continue to monitor.    Substance Induced Mood Disorder: -Continue Cymbalta 60 mg daily for mood disorder and neuropathy   DM2-insulin-dependent with neuropathy Urge Incontinence  BPH: -Continue SSI -Continue Novolog 3 units BID before lunch and dinner -Continue Semglee 24 Units daily -Continue Glipizide XL 10 mg daily with breakfast -Continue Metformin 1000 mg BID with meals -Continue Gabapentin 600 mg TID for neuropathy -Continue Flomax 0.8 mg daily   GERD: -Continue Pepcid 80m BID   HTN: -Continue Amlodipine 10 mg daily   Hyperlipidemia: -Continue Lipitor 40 mg daily   -Continue PRN's: Tylenol, Maalox, Atarax, Milk of Magnesia, Trazodone    Considerations for follow-up: -Recommend high risk screening every 6-12 months with HIV, RPR, hepatitis panel -PCP for insulin dependent DM2 - uncontrolled -PCP for MOCA and memory   Dispo: pending  ABriant Cedar MD 03/03/2022 9:49 AM

## 2022-03-03 NOTE — ED Provider Notes (Signed)
Facility Based Crisis Admission H&P  Date: 03/03/22 Patient Name: David Wyatt MRN: [id number] Chief Complaint: I want to detox Chief Complaint  Patient presents with   Addiction Problem      Diagnoses:  Final diagnoses:  Substance abuse ([place])  Cocaine use disorder ([place])    KWI:[id number] [name] is a 61 y/o male presenting voluntarily to [facility] for substance abuse treatment. Patient report that he wants detox treatment because, "I am trying to better my life".  Patient states he went to [facility] seeking treatment for his substance abuse and was told that he needed to come to [facility] for treatment.  Patient reports that he has been feeling depressed smoking crack cocaine.  Patient reports that he is from [place] he does not have any family or support in the area.  Patient reports that has been about a month since he smoked crack cocaine.  Patient reports that he was in a treatment program in [place] but he got arrested and went to jail.  Patient reports that he is homeless and he has been sleeping in abandoned cars.  Patient reports he does not currently have outpatient mental health treatment.  Patient currently has multiple stressors of homelessness, substance abuse and chronic physical health conditions of diabetes and hypertension.  Work every  Patient is alert oriented x 4 is calm and cooperative, mood is depressed with congruent affect.  Patient does not appear to be responding to internal or external stimuli at this time no apparent paranoia or delusions.Patient will be admitted to [facility] for crisis management, safety and stabilization.   Marijean Bravo Warrick-TTS  Pt is a 61 year old male who presents unaccompanied to [facility] after being referred by case management at [facility] for substance abuse treatment. Pt says he is "trying to better my life." He acknowledges he was tranferred from [facility]  02/06/2022 to [facility] in [place]. He says he completed the program but then was arrested  for "failure to appear" and taken to jail. He says he was returned to [facility] but had no place to stay. He says he went to [facility] but they did not have a bed available, so he went to [facility]. He says while there he had medical problems addressed. He told staff he wanted to continue treatment for substance use and feared he would relapse if he returned to being homeless and unsheltered.   Pt says he only uses crack and has not used since before admission to [facility]. He states he feels depressed due to his situation. He denies problems with sleep or appetite. He denies current suicidal ideation or history of suicide attempts. He denies current homicidal ideation. He denies auditory or visual hallucinations. He denies use of alcohol or other substances.   Pt says he has multiple medical problems including diabetes, HTN, spinal problems, hernias and urinary problems. He is receiving disability. He says he has no support in [place], that his parents are deceased and what family he has is in [place]. He says he has a court date 04/03/2021 for failure to appear. He denies access to firearms. He says he has an opportunity to obtain an apartment for $450 a month but it will not be available until after Christmas.   Pt is casually dressed and eating crackers. He is alert and oriented x4. Pt speaks in a clear tone, at moderate volume and normal pace. Motor behavior appears normal. Eye contact is good. Pt's mood is depressed and affect is  responsive. Thought process is coherent and relevant. There is no indication he is currently responding to internal stimuli or experiencing delusional thought content. He is cooperative and requesting assistance. PHQ 2-9:  [place] ED from 03/02/2022 in [facility] Office Visit from 10/08/2021 in [place] Office Visit from 09/05/2021 in [place] [facility]  Thoughts that you would be better off dead, or of hurting  yourself in some way Not at all Not at all Not at all  PHQ-9 Total Score _0 Flowsheet Row ED from 03/02/2022 in [place] Most recent reading at 03/03/2022  1:19 AM ED from 03/01/2022 in [place] Most recent reading at 03/01/2022  5:43 PM ED from 03/01/2022 in [place] Most recent reading at 03/01/2022 12:10 AM  C-SSRS RISK CATEGORY No Risk No Risk No Risk        Total Time spent with patient: 30 minutes  Musculoskeletal  Strength & Muscle Tone: within normal limits Gait & Station: normal Patient leans: N/A  Psychiatric Specialty Exam  Presentation General Appearance:  Casual  Eye Contact: Good  Speech: Clear and Coherent  Speech Volume: Normal  Handedness: Right   Mood and Affect  Mood: Depressed  Affect: Depressed   Thought Process  Thought Processes: Coherent  Descriptions of Associations:Intact  Orientation:Full (Time, Place and Person)  Thought Content:WDL  Diagnosis of Schizophrenia or Schizoaffective disorder in past: No   Hallucinations:Hallucinations: None  Ideas of Reference:None  Suicidal Thoughts:Suicidal Thoughts: No  Homicidal Thoughts:Homicidal Thoughts: No   Sensorium  Memory: Immediate Good; Recent Good; Remote Good  Judgment: Fair  Insight: Fair   Materials engineer: Fair  Attention Span: Fair  Recall: [employer] of Knowledge: Fair  Language: Fair   Psychomotor Activity  Psychomotor Activity: Psychomotor Activity: Normal   Assets  Assets: Communication Skills; Desire for Improvement; Resilience   Sleep  Sleep: Sleep: Fair Number of Hours of Sleep: -1   Nutritional Assessment (For OBS and FBC admissions only) Has the patient had a weight loss or gain of 10 pounds or more in the last 3 months?: No Has the patient had a decrease in food intake/or  appetite?: No Does the patient have dental problems?: No Does the patient have eating habits or behaviors that may be indicators of an eating disorder including binging or inducing vomiting?: No Has the patient recently lost weight without trying?: 0 Has the patient been eating poorly because of a decreased appetite?: 0 Malnutrition Screening Tool Score: 0    Physical Exam Constitutional:      Appearance: Normal appearance.  HENT:     Head: Normocephalic and atraumatic.     Nose: Nose normal.  Eyes:     Pupils: Pupils are equal, round, and reactive to light.  Cardiovascular:     Rate and Rhythm: Normal rate.  Pulmonary:     Effort: Pulmonary effort is normal.  Abdominal:     General: Abdomen is flat.  Musculoskeletal:        General: Normal range of motion.     Cervical back: Normal range of motion.  Skin:    General: Skin is warm.  Neurological:     Mental Status: He is alert and oriented to person, place, and time.  Psychiatric:        Attention and Perception: Attention normal.  Mood and Affect: Mood is depressed.        Speech: Speech normal.        Behavior: Behavior normal.        Thought Content: Thought content normal.        Cognition and Memory: Cognition normal.        Judgment: Judgment is impulsive.    Review of Systems  Constitutional: Negative.   HENT: Negative.    Eyes: Negative.   Respiratory: Negative.    Cardiovascular: Negative.   Gastrointestinal: Negative.   Genitourinary: Negative.   Musculoskeletal: Negative.   Skin: Negative.   Neurological: Negative.   Endo/Heme/Allergies: Negative.   Psychiatric/Behavioral:  Positive for depression. The patient is nervous/anxious.     Blood pressure (!) 153/91, pulse 70, temperature 97.8 F (36.6 C), temperature source Oral, resp. rate 18, SpO2 99 %. There is no height or weight on file to calculate BMI.  Past Psychiatric History: [facility] [place]  Is the patient at risk to self? No  Has the patient  been a risk to self in the past 6 months? No .    Has the patient been a risk to self within the distant past? No   Is the patient a risk to others? No   Has the patient been a risk to others in the past 6 months? No   Has the patient been a risk to others within the distant past? No   Past Medical History:  Past Medical History:  Diagnosis Date   Angioedema of lips 07/28/2012   left upper (07/29/2012)   Arthritis    hands and back   Chronic back pain    Cocaine abuse (HCC)    Diabetic neuropathy (HCC)    High cholesterol    Hypertension    Neuropathy    Type II diabetes mellitus ([place])     Past Surgical History:  Procedure Laterality Date   BACK SURGERY     CYSTOSCOPY W/ URETERAL STENT PLACEMENT Right 02/23/2021   Procedure: CYSTOSCOPY WITH RETROGRADE PYELOGRAM/URETERAL STENT PLACEMENT;  Surgeon: Janith Lima, MD;  Location: [facility];  Service: Urology;  Laterality: Right;   HERNIA REPAIR Right 04/01/2012   I & D EXTREMITY Left 12/13/2012   Procedure: IRRIGATION AND DEBRIDEMENT LEFT THUMB;  Surgeon: Tennis Must, MD;  Location: [place];  Service: Orthopedics;  Laterality: Left;   INCISION AND DRAINAGE OF WOUND     boil on back/notes 07/14/2008 (07/29/2012)   INGUINAL HERNIA REPAIR  04/01/2012   Procedure: HERNIA REPAIR INGUINAL ADULT;  Surgeon: Haywood Lasso, MD;  Location: [place];  Service: General;  Laterality: Right;   INGUINAL HERNIA REPAIR Left 09/06/2016   Procedure: OPEN REPAIR LEFT INGUINAL HERNIA;  Surgeon: Greer Pickerel, MD;  Location: [place];  Service: General;  Laterality: Left;   INSERTION OF MESH Left 09/06/2016   Procedure: INSERTION OF MESH;  Surgeon: Greer Pickerel, MD;  Location: [facility];  Service: General;  Laterality: Left;   TONSILLECTOMY      Family History:  Family History  Problem Relation Age of Onset   Diabetes Mother    Kidney disease Mother    Hyperlipidemia Mother    Hyperlipidemia Father    Diabetes Father     Social History:  Social History    Socioeconomic History   Marital status: Divorced    Spouse name: Not on file   Number of children: 3   Years of education: 12   Highest education level: Not on  file  Occupational History    Comment: disabled  Tobacco Use   Smoking status: Every Day    Packs/day: 0.25    Years: 30.00    Total pack years: 7.50    Types: Cigarettes   Smokeless tobacco: Former    Quit date: 08/15/2015   Tobacco comments:    04/29/16 2  cigs daily, 10/27/17 sometimes < .25 PPD  Vaping Use   Vaping Use: Never used  Substance and Sexual Activity   Alcohol use: No    Alcohol/week: 0.0 standard drinks of alcohol   Drug use: Not Currently    Types: "Crack" cocaine   Sexual activity: Not on file  Other Topics Concern   Not on file  Social History Narrative   04/29/17   Lives in shelter   Caffeine- a lot of  tea, coffee   Social Determinants of Health   Financial Resource Strain: Not on file  Food Insecurity: Not on file  Transportation Needs: Not on file  Physical Activity: Not on file  Stress: Not on file  Social Connections: Not on file  Intimate Partner Violence: Not on file    SDOH:  SDOH Screenings   Depression (PHQ2-9): Low Risk  (03/03/2022)  Recent Concern: Depression (PHQ2-9) - Medium Risk (03/03/2022)  Tobacco Use: High Risk (03/01/2022)    Last Labs:  Admission on 03/02/2022  Component Date Value Ref Range Status   TSH 03/02/2022 0.735  0.350 - 4.500 uIU/mL Final   Comment: Performed by a 3rd Generation assay with a functional sensitivity of <=0.01 uIU/mL. Performed at [facility], [place] [address] [place], [place] [address]   Admission on 03/01/2022, Discharged on 03/02/2022  Component Date Value Ref Range Status   WBC 03/02/2022 6.5  4.0 - 10.5 K/uL Final   RBC 03/02/2022 4.21 (L)  4.22 - 5.81 MIL/uL Final   Hemoglobin 03/02/2022 12.8 (L)  13.0 - 17.0 g/dL Final   HCT 03/02/2022 40.2  39.0 - 52.0 % Final   MCV 03/02/2022 95.5  80.0 - 100.0 fL Final   MCH  03/02/2022 30.4  26.0 - 34.0 pg Final   MCHC 03/02/2022 31.8  30.0 - 36.0 g/dL Final   RDW 03/02/2022 14.2  11.5 - 15.5 % Final   Platelets 03/02/2022 372  150 - 400 K/uL Final   nRBC 03/02/2022 0.0  0.0 - 0.2 % Final   Neutrophils Relative % 03/02/2022 60  % Final   Neutro Abs 03/02/2022 4.0  1.7 - 7.7 K/uL Final   Lymphocytes Relative 03/02/2022 26  % Final   Lymphs Abs 03/02/2022 1.7  0.7 - 4.0 K/uL Final   Monocytes Relative 03/02/2022 8  % Final   Monocytes Absolute 03/02/2022 0.5  0.1 - 1.0 K/uL Final   Eosinophils Relative 03/02/2022 4  % Final   Eosinophils Absolute 03/02/2022 0.2  0.0 - 0.5 K/uL Final   Basophils Relative 03/02/2022 1  % Final   Basophils Absolute 03/02/2022 0.0  0.0 - 0.1 K/uL Final   Immature Granulocytes 03/02/2022 1  % Final   Abs Immature Granulocytes 03/02/2022 0.03  0.00 - 0.07 K/uL Final   Performed at [facility], [place] [address] [place], [place] [address]   Sodium 03/02/2022 130 (L)  135 - 145 mmol/L Final   Potassium 03/02/2022 4.4  3.5 - 5.1 mmol/L Final   Chloride 03/02/2022 96 (L)  98 - 111 mmol/L Final   CO2 03/02/2022 24  22 - 32 mmol/L Final  Glucose, Bld 03/02/2022 709 (HH)  70 - 99 mg/dL Final   Comment: CRITICAL RESULT CALLED TO, READ BACK BY AND VERIFIED WITH J. NEWTON RN 03/02/22 _0  BY J. WHITE Glucose reference range applies only to samples taken after fasting for at least 8 hours.    BUN 03/02/2022 16  8 - 23 mg/dL Final   Creatinine, Ser 03/02/2022 1.36 (H)  0.61 - 1.24 mg/dL Final   Calcium 03/02/2022 8.7 (L)  8.9 - 10.3 mg/dL Final   Total Protein 03/02/2022 6.5  6.5 - 8.1 g/dL Final   Albumin 03/02/2022 3.0 (L)  3.5 - 5.0 g/dL Final   AST 03/02/2022 35  15 - 41 U/L Final   ALT 03/02/2022 22  0 - 44 U/L Final   Alkaline Phosphatase 03/02/2022 92  38 - 126 U/L Final   Total Bilirubin 03/02/2022 0.2 (L)  0.3 - 1.2 mg/dL Final   GFR, Estimated 03/02/2022 59 (L)  >60 mL/min Final   Comment: (NOTE) Calculated using the  CKD-EPI Creatinine Equation (2021)    Anion gap 03/02/2022 10  5 - 15 Final   Performed at [facility] [address] [place], [place] [address]   Alcohol, Ethyl (B) 03/02/2022 <10  <10 mg/dL Final   Comment: (NOTE) Lowest detectable limit for serum alcohol is 10 mg/dL.  For medical purposes only. Performed at [facility], [place] [address] [place], [place] [address]    Opiates 03/02/2022 NONE DETECTED  NONE DETECTED Final   Cocaine 03/02/2022 POSITIVE (A)  NONE DETECTED Final   Benzodiazepines 03/02/2022 NONE DETECTED  NONE DETECTED Final   Amphetamines 03/02/2022 NONE DETECTED  NONE DETECTED Final   Tetrahydrocannabinol 03/02/2022 NONE DETECTED  NONE DETECTED Final   Barbiturates 03/02/2022 NONE DETECTED  NONE DETECTED Final   Comment: (NOTE) DRUG SCREEN FOR MEDICAL PURPOSES ONLY.  IF CONFIRMATION IS NEEDED FOR ANY PURPOSE, NOTIFY LAB WITHIN 5 DAYS.  LOWEST DETECTABLE LIMITS FOR URINE DRUG SCREEN Drug Class                     Cutoff (ng/mL) Amphetamine and metabolites    1000 Barbiturate and metabolites    200 Benzodiazepine                 200 Opiates and metabolites        300 Cocaine and metabolites        300 THC                            50 Performed at [facility], [place] [address] [place], [place] [address]    Color, Urine 03/02/2022 STRAW (A)  YELLOW Final   APPearance 03/02/2022 HAZY (A)  CLEAR Final   Specific Gravity, Urine 03/02/2022 1.023  1.005 - 1.030 Final   pH 03/02/2022 6.0  5.0 - 8.0 Final   Glucose, UA 03/02/2022 >=500 (A)  NEGATIVE mg/dL Final   Hgb urine dipstick 03/02/2022 NEGATIVE  NEGATIVE Final   Bilirubin Urine 03/02/2022 NEGATIVE  NEGATIVE Final   Ketones, ur 03/02/2022 NEGATIVE  NEGATIVE mg/dL Final   Protein, ur 03/02/2022 NEGATIVE  NEGATIVE mg/dL Final   Nitrite 03/02/2022 NEGATIVE  NEGATIVE Final   Leukocytes,Ua 03/02/2022 LARGE (A)  NEGATIVE Final   RBC / HPF 03/02/2022 0-5  0 - 5 RBC/hpf Final   WBC, UA  03/02/2022 >50 (H)  0 - 5 WBC/hpf Final   Bacteria, UA 03/02/2022  RARE (A)  NONE SEEN Final   Squamous Epithelial / LPF 03/02/2022 0-5  0 - 5 Final   WBC Clumps 03/02/2022 PRESENT   Final   Mucus 03/02/2022 PRESENT   Final   Budding Yeast 03/02/2022 PRESENT   Final   Performed at [facility], [place] [address] [place], [place] [address]   SARS Coronavirus 2 by RT PCR 03/02/2022 NEGATIVE  NEGATIVE Final   Comment: (NOTE) SARS-CoV-2 target nucleic acids are NOT DETECTED.  The SARS-CoV-2 RNA is generally detectable in upper respiratory specimens during the acute phase of infection. The lowest concentration of SARS-CoV-2 viral copies this assay can detect is 138 copies/mL. A negative result does not preclude SARS-Cov-2 infection and should not be used as the sole basis for treatment or other patient management decisions. A negative result may occur with  improper specimen collection/handling, submission of specimen other than nasopharyngeal swab, presence of viral mutation(s) within the areas targeted by this assay, and inadequate number of viral copies(<138 copies/mL). A negative result must be combined with clinical observations, patient history, and epidemiological information. The expected result is Negative.  Fact Sheet for Patients:  EntrepreneurPulse.com.au  Fact Sheet for Healthcare Providers:  IncredibleEmployment.be  This test is no                          t yet approved or cleared by the [place] FDA and  has been authorized for detection and/or diagnosis of SARS-CoV-2 by FDA under an Emergency Use Authorization (EUA). This EUA will remain  in effect (meaning this test can be used) for the duration of the COVID-19 declaration under Section 564(b)(1) of the Act, 21 U.S.C.section 360bbb-3(b)(1), unless the authorization is terminated  or revoked sooner.       Influenza A by PCR 03/02/2022 NEGATIVE  NEGATIVE Final   Influenza  B by PCR 03/02/2022 NEGATIVE  NEGATIVE Final   Comment: (NOTE) The Xpert Xpress SARS-CoV-2/FLU/RSV plus assay is intended as an aid in the diagnosis of influenza from Nasopharyngeal swab specimens and should not be used as a sole basis for treatment. Nasal washings and aspirates are unacceptable for Xpert Xpress SARS-CoV-2/FLU/RSV testing.  Fact Sheet for Patients: EntrepreneurPulse.com.au  Fact Sheet for Healthcare Providers: IncredibleEmployment.be  This test is not yet approved or cleared by the [place] FDA and has been authorized for detection and/or diagnosis of SARS-CoV-2 by FDA under an Emergency Use Authorization (EUA). This EUA will remain in effect (meaning this test can be used) for the duration of the COVID-19 declaration under Section 564(b)(1) of the Act, 21 U.S.C. section 360bbb-3(b)(1), unless the authorization is terminated or revoked.     Resp Syncytial Virus by PCR 03/02/2022 NEGATIVE  NEGATIVE Final   Comment: (NOTE) Fact Sheet for Patients: EntrepreneurPulse.com.au  Fact Sheet for Healthcare Providers: IncredibleEmployment.be  This test is not yet approved or cleared by the [place] FDA and has been authorized for detection and/or diagnosis of SARS-CoV-2 by FDA under an Emergency Use Authorization (EUA). This EUA will remain in effect (meaning this test can be used) for the duration of the COVID-19 declaration under Section 564(b)(1) of the Act, 21 U.S.C. section 360bbb-3(b)(1), unless the authorization is terminated or revoked.  Performed at [facility], [place] [address], [place], [place] [address]    Glucose-Capillary 03/02/2022 >600 (HH)  70 - 99 mg/dL Final   Glucose reference range applies only to samples taken after fasting for at least 8  hours.   Comment 1 03/02/2022 Document in Chart   Final   Glucose-Capillary 03/02/2022 241 (H)  70 - 99 mg/dL Final    Glucose reference range applies only to samples taken after fasting for at least 8 hours.   Comment 1 03/02/2022 Document in Chart   Final   Glucose-Capillary 03/02/2022 390 (H)  70 - 99 mg/dL Final   Glucose reference range applies only to samples taken after fasting for at least 8 hours.  Admission on 03/01/2022, Discharged on 03/01/2022  Component Date Value Ref Range Status   Glucose-Capillary 03/01/2022 289 (H)  70 - 99 mg/dL Final   Glucose reference range applies only to samples taken after fasting for at least 8 hours.  Admission on 01/21/2022, Discharged on 02/06/2022  Component Date Value Ref Range Status   Hgb A1c MFr Bld 01/21/2022 10.4 (H)  4.8 - 5.6 % Final   Comment: (NOTE) Pre diabetes:          5.7%-6.4%  Diabetes:              >6.4%  Glycemic control for   <7.0% adults with diabetes    Mean Plasma Glucose 01/21/2022 251.78  mg/dL Final   Performed at [facility] [address] [place], [place] [address]   TSH 01/21/2022 0.248 (L)  0.350 - 4.500 uIU/mL Final   Comment: Performed by a 3rd Generation assay with a functional sensitivity of <=0.01 uIU/mL. Performed at [facility], [place] [address], [place], [place] [address]    Glucose-Capillary 01/21/2022 374 (H)  70 - 99 mg/dL Final   Glucose reference range applies only to samples taken after fasting for at least 8 hours.   Glucose-Capillary 01/21/2022 289 (H)  70 - 99 mg/dL Final   Glucose reference range applies only to samples taken after fasting for at least 8 hours.   Glucose-Capillary 01/22/2022 137 (H)  70 - 99 mg/dL Final   Glucose reference range applies only to samples taken after fasting for at least 8 hours.   Glucose-Capillary 01/22/2022 297 (H)  70 - 99 mg/dL Final   Glucose reference range applies only to samples taken after fasting for at least 8 hours.   T3, Free 01/23/2022 2.2  2.0 - 4.4 pg/mL Final   Comment: (NOTE) Performed At: [facility] [place], [place]  [id number] Rush Farmer MD [id number]    Glucose-Capillary 01/22/2022 94  70 - 99 mg/dL Final   Glucose reference range applies only to samples taken after fasting for at least 8 hours.   Free T4 01/23/2022 0.72  0.61 - 1.12 ng/dL Final   Comment: (NOTE) Biotin ingestion may interfere with free T4 tests. If the results are inconsistent with the TSH level, previous test results, or the clinical presentation, then consider biotin interference. If needed, order repeat testing after stopping biotin. Performed at [facility], [place] [address] [place], [place] [address]    TSH 01/23/2022 0.203 (L)  0.350 - 4.500 uIU/mL Final   Comment: Performed by a 3rd Generation assay with a functional sensitivity of <=0.01 uIU/mL. Performed at [facility], [place] [address], [place], [place] [address]    Glucose-Capillary 01/23/2022 132 (H)  70 - 99 mg/dL Final   Glucose reference range applies only to samples taken after fasting for at least 8 hours.   Glucose-Capillary 01/22/2022 59 (L)  70 - 99 mg/dL Final   Glucose reference range applies only to samples taken  after fasting for at least 8 hours.   Glucose-Capillary 01/22/2022 177 (H)  70 - 99 mg/dL Final   Glucose reference range applies only to samples taken after fasting for at least 8 hours.   Glucose-Capillary 01/23/2022 284 (H)  70 - 99 mg/dL Final   Glucose reference range applies only to samples taken after fasting for at least 8 hours.   Glucose-Capillary 01/23/2022 188 (H)  70 - 99 mg/dL Final   Glucose reference range applies only to samples taken after fasting for at least 8 hours.   Glucose-Capillary 01/23/2022 294 (H)  70 - 99 mg/dL Final   Glucose reference range applies only to samples taken after fasting for at least 8 hours.   Glucose-Capillary 01/24/2022 180 (H)  70 - 99 mg/dL Final   Glucose reference range applies only to samples taken after fasting for at least 8 hours.   Glucose-Capillary 01/24/2022 275 (H)  70 -  99 mg/dL Final   Glucose reference range applies only to samples taken after fasting for at least 8 hours.   Glucose-Capillary 01/24/2022 146 (H)  70 - 99 mg/dL Final   Glucose reference range applies only to samples taken after fasting for at least 8 hours.   Glucose-Capillary 01/24/2022 137 (H)  70 - 99 mg/dL Final   Glucose reference range applies only to samples taken after fasting for at least 8 hours.   Glucose-Capillary 01/25/2022 201 (H)  70 - 99 mg/dL Final   Glucose reference range applies only to samples taken after fasting for at least 8 hours.   Glucose-Capillary 01/25/2022 195 (H)  70 - 99 mg/dL Final   Glucose reference range applies only to samples taken after fasting for at least 8 hours.   Glucose-Capillary 01/25/2022 149 (H)  70 - 99 mg/dL Final   Glucose reference range applies only to samples taken after fasting for at least 8 hours.   Glucose-Capillary 01/25/2022 147 (H)  70 - 99 mg/dL Final   Glucose reference range applies only to samples taken after fasting for at least 8 hours.   Glucose-Capillary 01/26/2022 123 (H)  70 - 99 mg/dL Final   Glucose reference range applies only to samples taken after fasting for at least 8 hours.   Glucose-Capillary 01/26/2022 176 (H)  70 - 99 mg/dL Final   Glucose reference range applies only to samples taken after fasting for at least 8 hours.   Glucose-Capillary 01/26/2022 84  70 - 99 mg/dL Final   Glucose reference range applies only to samples taken after fasting for at least 8 hours.   Glucose-Capillary 01/26/2022 177 (H)  70 - 99 mg/dL Final   Glucose reference range applies only to samples taken after fasting for at least 8 hours.   Glucose-Capillary 01/27/2022 150 (H)  70 - 99 mg/dL Final   Glucose reference range applies only to samples taken after fasting for at least 8 hours.   Glucose-Capillary 01/27/2022 171 (H)  70 - 99 mg/dL Final   Glucose reference range applies only to samples taken after fasting for at least 8  hours.   Glucose-Capillary 01/27/2022 159 (H)  70 - 99 mg/dL Final   Glucose reference range applies only to samples taken after fasting for at least 8 hours.   Glucose-Capillary 01/27/2022 67 (L)  70 - 99 mg/dL Final   Glucose reference range applies only to samples taken after fasting for at least 8 hours.   Glucose-Capillary 01/27/2022 107 (H)  70 - 99 mg/dL Final  Glucose reference range applies only to samples taken after fasting for at least 8 hours.   Glucose-Capillary 01/27/2022 144 (H)  70 - 99 mg/dL Final   Glucose reference range applies only to samples taken after fasting for at least 8 hours.   Glucose-Capillary 01/28/2022 175 (H)  70 - 99 mg/dL Final   Glucose reference range applies only to samples taken after fasting for at least 8 hours.   Glucose-Capillary 01/28/2022 198 (H)  70 - 99 mg/dL Final   Glucose reference range applies only to samples taken after fasting for at least 8 hours.   Glucose-Capillary 01/28/2022 186 (H)  70 - 99 mg/dL Final   Glucose reference range applies only to samples taken after fasting for at least 8 hours.   Glucose-Capillary 01/28/2022 198 (H)  70 - 99 mg/dL Final   Glucose reference range applies only to samples taken after fasting for at least 8 hours.   Glucose-Capillary 01/29/2022 111 (H)  70 - 99 mg/dL Final   Glucose reference range applies only to samples taken after fasting for at least 8 hours.   Glucose-Capillary 01/29/2022 160 (H)  70 - 99 mg/dL Final   Glucose reference range applies only to samples taken after fasting for at least 8 hours.   Glucose-Capillary 01/29/2022 209 (H)  70 - 99 mg/dL Final   Glucose reference range applies only to samples taken after fasting for at least 8 hours.   Glucose-Capillary 01/29/2022 155 (H)  70 - 99 mg/dL Final   Glucose reference range applies only to samples taken after fasting for at least 8 hours.   Glucose-Capillary 01/30/2022 157 (H)  70 - 99 mg/dL Final   Glucose reference range applies  only to samples taken after fasting for at least 8 hours.   Glucose-Capillary 01/30/2022 168 (H)  70 - 99 mg/dL Final   Glucose reference range applies only to samples taken after fasting for at least 8 hours.   Glucose-Capillary 01/30/2022 217 (H)  70 - 99 mg/dL Final   Glucose reference range applies only to samples taken after fasting for at least 8 hours.   Glucose-Capillary 01/30/2022 246 (H)  70 - 99 mg/dL Final   Glucose reference range applies only to samples taken after fasting for at least 8 hours.   Glucose-Capillary 01/30/2022 287 (H)  70 - 99 mg/dL Final   Glucose reference range applies only to samples taken after fasting for at least 8 hours.   Glucose-Capillary 01/31/2022 138 (H)  70 - 99 mg/dL Final   Glucose reference range applies only to samples taken after fasting for at least 8 hours.   Glucose-Capillary 01/31/2022 193 (H)  70 - 99 mg/dL Final   Glucose reference range applies only to samples taken after fasting for at least 8 hours.   Glucose-Capillary 01/31/2022 274 (H)  70 - 99 mg/dL Final   Glucose reference range applies only to samples taken after fasting for at least 8 hours.   Glucose-Capillary 01/31/2022 320 (H)  70 - 99 mg/dL Final   Glucose reference range applies only to samples taken after fasting for at least 8 hours.   Glucose-Capillary 01/31/2022 160 (H)  70 - 99 mg/dL Final   Glucose reference range applies only to samples taken after fasting for at least 8 hours.   Glucose-Capillary 02/01/2022 184 (H)  70 - 99 mg/dL Final   Glucose reference range applies only to samples taken after fasting for at least 8 hours.   Glucose-Capillary 02/01/2022  138 (H)  70 - 99 mg/dL Final   Glucose reference range applies only to samples taken after fasting for at least 8 hours.   Glucose-Capillary 02/01/2022 171 (H)  70 - 99 mg/dL Final   Glucose reference range applies only to samples taken after fasting for at least 8 hours.   Glucose-Capillary 02/01/2022 124 (H)  70  - 99 mg/dL Final   Glucose reference range applies only to samples taken after fasting for at least 8 hours.   Glucose-Capillary 02/02/2022 112 (H)  70 - 99 mg/dL Final   Glucose reference range applies only to samples taken after fasting for at least 8 hours.   Glucose-Capillary 02/02/2022 125 (H)  70 - 99 mg/dL Final   Glucose reference range applies only to samples taken after fasting for at least 8 hours.   Glucose-Capillary 02/02/2022 274 (H)  70 - 99 mg/dL Final   Glucose reference range applies only to samples taken after fasting for at least 8 hours.   Glucose-Capillary 02/02/2022 239 (H)  70 - 99 mg/dL Final   Glucose reference range applies only to samples taken after fasting for at least 8 hours.   Glucose-Capillary 02/02/2022 190 (H)  70 - 99 mg/dL Final   Glucose reference range applies only to samples taken after fasting for at least 8 hours.   Glucose-Capillary 02/02/2022 183 (H)  70 - 99 mg/dL Final   Glucose reference range applies only to samples taken after fasting for at least 8 hours.   Glucose-Capillary 02/03/2022 113 (H)  70 - 99 mg/dL Final   Glucose reference range applies only to samples taken after fasting for at least 8 hours.   Glucose-Capillary 02/03/2022 158 (H)  70 - 99 mg/dL Final   Glucose reference range applies only to samples taken after fasting for at least 8 hours.   Glucose-Capillary 02/03/2022 100 (H)  70 - 99 mg/dL Final   Glucose reference range applies only to samples taken after fasting for at least 8 hours.   Glucose-Capillary 02/03/2022 162 (H)  70 - 99 mg/dL Final   Glucose reference range applies only to samples taken after fasting for at least 8 hours.   Glucose-Capillary 02/03/2022 114 (H)  70 - 99 mg/dL Final   Glucose reference range applies only to samples taken after fasting for at least 8 hours.   Glucose-Capillary 02/04/2022 138 (H)  70 - 99 mg/dL Final   Glucose reference range applies only to samples taken after fasting for at least  8 hours.   Glucose-Capillary 02/04/2022 157 (H)  70 - 99 mg/dL Final   Glucose reference range applies only to samples taken after fasting for at least 8 hours.   Glucose-Capillary 02/04/2022 208 (H)  70 - 99 mg/dL Final   Glucose reference range applies only to samples taken after fasting for at least 8 hours.   Glucose-Capillary 02/04/2022 212 (H)  70 - 99 mg/dL Final   Glucose reference range applies only to samples taken after fasting for at least 8 hours.   Glucose-Capillary 02/05/2022 184 (H)  70 - 99 mg/dL Final   Glucose reference range applies only to samples taken after fasting for at least 8 hours.   Glucose-Capillary 02/05/2022 192 (H)  70 - 99 mg/dL Final   Glucose reference range applies only to samples taken after fasting for at least 8 hours.   Glucose-Capillary 02/05/2022 128 (H)  70 - 99 mg/dL Final   Glucose reference range applies only to samples taken  after fasting for at least 8 hours.   Glucose-Capillary 02/05/2022 165 (H)  70 - 99 mg/dL Final   Glucose reference range applies only to samples taken after fasting for at least 8 hours.   Glucose-Capillary 02/06/2022 111 (H)  70 - 99 mg/dL Final   Glucose reference range applies only to samples taken after fasting for at least 8 hours.   Glucose-Capillary 02/05/2022 262 (H)  70 - 99 mg/dL Final   Glucose reference range applies only to samples taken after fasting for at least 8 hours.  Admission on 01/20/2022, Discharged on 01/21/2022  Component Date Value Ref Range Status   SARS Coronavirus 2 by RT PCR 01/20/2022 NEGATIVE  NEGATIVE Final   Comment: (NOTE) SARS-CoV-2 target nucleic acids are NOT DETECTED.  The SARS-CoV-2 RNA is generally detectable in upper respiratory specimens during the acute phase of infection. The lowest concentration of SARS-CoV-2 viral copies this assay can detect is 138 copies/mL. A negative result does not preclude SARS-Cov-2 infection and should not be used as the sole basis for treatment  or other patient management decisions. A negative result may occur with  improper specimen collection/handling, submission of specimen other than nasopharyngeal swab, presence of viral mutation(s) within the areas targeted by this assay, and inadequate number of viral copies(<138 copies/mL). A negative result must be combined with clinical observations, patient history, and epidemiological information. The expected result is Negative.  Fact Sheet for Patients:  EntrepreneurPulse.com.au  Fact Sheet for Healthcare Providers:  IncredibleEmployment.be  This test is no                          t yet approved or cleared by the [place] FDA and  has been authorized for detection and/or diagnosis of SARS-CoV-2 by FDA under an Emergency Use Authorization (EUA). This EUA will remain  in effect (meaning this test can be used) for the duration of the COVID-19 declaration under Section 564(b)(1) of the Act, 21 U.S.C.section 360bbb-3(b)(1), unless the authorization is terminated  or revoked sooner.       Influenza A by PCR 01/20/2022 NEGATIVE  NEGATIVE Final   Influenza B by PCR 01/20/2022 NEGATIVE  NEGATIVE Final   Comment: (NOTE) The Xpert Xpress SARS-CoV-2/FLU/RSV plus assay is intended as an aid in the diagnosis of influenza from Nasopharyngeal swab specimens and should not be used as a sole basis for treatment. Nasal washings and aspirates are unacceptable for Xpert Xpress SARS-CoV-2/FLU/RSV testing.  Fact Sheet for Patients: EntrepreneurPulse.com.au  Fact Sheet for Healthcare Providers: IncredibleEmployment.be  This test is not yet approved or cleared by the [place] FDA and has been authorized for detection and/or diagnosis of SARS-CoV-2 by FDA under an Emergency Use Authorization (EUA). This EUA will remain in effect (meaning this test can be used) for the duration of the COVID-19 declaration under  Section 564(b)(1) of the Act, 21 U.S.C. section 360bbb-3(b)(1), unless the authorization is terminated or revoked.  Performed at [facility], [place] [address] [place], [place] [address]    Sodium 01/20/2022 136  135 - 145 mmol/L Final   Potassium 01/20/2022 4.0  3.5 - 5.1 mmol/L Final   Chloride 01/20/2022 103  98 - 111 mmol/L Final   CO2 01/20/2022 24  22 - 32 mmol/L Final   Glucose, Bld 01/20/2022 360 (H)  70 - 99 mg/dL Final   Glucose reference range applies only to samples taken after fasting for at least 8 hours.   BUN  01/20/2022 12  8 - 23 mg/dL Final   Creatinine, Ser 01/20/2022 1.01  0.61 - 1.24 mg/dL Final   Calcium 01/20/2022 9.4  8.9 - 10.3 mg/dL Final   Total Protein 01/20/2022 7.3  6.5 - 8.1 g/dL Final   Albumin 01/20/2022 3.4 (L)  3.5 - 5.0 g/dL Final   AST 01/20/2022 14 (L)  15 - 41 U/L Final   ALT 01/20/2022 10  0 - 44 U/L Final   Alkaline Phosphatase 01/20/2022 101  38 - 126 U/L Final   Total Bilirubin 01/20/2022 0.3  0.3 - 1.2 mg/dL Final   GFR, Estimated 01/20/2022 >60  >60 mL/min Final   Comment: (NOTE) Calculated using the CKD-EPI Creatinine Equation (2021)    Anion gap 01/20/2022 9  5 - 15 Final   Performed at [place] [address] [place], [place] [address]   Alcohol, Ethyl (B) 01/20/2022 <10  <10 mg/dL Final   Comment: (NOTE) Lowest detectable limit for serum alcohol is 10 mg/dL.  For medical purposes only. Performed at [facility], [place] [address], [place], [place] [address]    Opiates 01/20/2022 NONE DETECTED  NONE DETECTED Final   Cocaine 01/20/2022 POSITIVE (A)  NONE DETECTED Final   Benzodiazepines 01/20/2022 NONE DETECTED  NONE DETECTED Final   Amphetamines 01/20/2022 NONE DETECTED  NONE DETECTED Final   Tetrahydrocannabinol 01/20/2022 NONE DETECTED  NONE DETECTED Final   Barbiturates 01/20/2022 NONE DETECTED  NONE DETECTED Final   Comment: (NOTE) DRUG SCREEN FOR MEDICAL PURPOSES ONLY.  IF CONFIRMATION IS NEEDED FOR  ANY PURPOSE, NOTIFY LAB WITHIN 5 DAYS.  LOWEST DETECTABLE LIMITS FOR URINE DRUG SCREEN Drug Class                     Cutoff (ng/mL) Amphetamine and metabolites    1000 Barbiturate and metabolites    200 Benzodiazepine                 200 Opiates and metabolites        300 Cocaine and metabolites        300 THC                            50 Performed at [facility], [place] [address] [name], [place] [address]    WBC 01/20/2022 5.2  4.0 - 10.5 K/uL Final   RBC 01/20/2022 4.73  4.22 - 5.81 MIL/uL Final   Hemoglobin 01/20/2022 14.3  13.0 - 17.0 g/dL Final   HCT 01/20/2022 43.1  39.0 - 52.0 % Final   MCV 01/20/2022 91.1  80.0 - 100.0 fL Final   MCH 01/20/2022 30.2  26.0 - 34.0 pg Final   MCHC 01/20/2022 33.2  30.0 - 36.0 g/dL Final   RDW 01/20/2022 14.1  11.5 - 15.5 % Final   Platelets 01/20/2022 444 (H)  150 - 400 K/uL Final   nRBC 01/20/2022 0.0  0.0 - 0.2 % Final   Neutrophils Relative % 01/20/2022 61  % Final   Neutro Abs 01/20/2022 3.2  1.7 - 7.7 K/uL Final   Lymphocytes Relative 01/20/2022 29  % Final   Lymphs Abs 01/20/2022 1.5  0.7 - 4.0 K/uL Final   Monocytes Relative 01/20/2022 7  % Final   Monocytes Absolute 01/20/2022 0.4  0.1 - 1.0 K/uL Final   Eosinophils Relative 01/20/2022 2  % Final   Eosinophils Absolute 01/20/2022 0.1  0.0 - 0.5  K/uL Final   Basophils Relative 01/20/2022 1  % Final   Basophils Absolute 01/20/2022 0.0  0.0 - 0.1 K/uL Final   Immature Granulocytes 01/20/2022 0  % Final   Abs Immature Granulocytes 01/20/2022 0.01  0.00 - 0.07 K/uL Final   Performed at [facility], [place] [address] [place], [place] [address]   Salicylate Lvl 82/95/6213 <7.0 (L)  7.0 - 30.0 mg/dL Final   Performed at [place] [address] [place], [place] [address]   Acetaminophen (Tylenol), Serum 01/20/2022 <10 (L)  10 - 30 ug/mL Final   Comment: (NOTE) Therapeutic concentrations vary significantly. A range of 10-30 ug/mL  may be an effective  concentration for many patients. However, some  are best treated at concentrations outside of this range. Acetaminophen concentrations >150 ug/mL at 4 hours after ingestion  and >50 ug/mL at 12 hours after ingestion are often associated with  toxic reactions.  Performed at [facility], [place] [address], [place], [place] [address]    Glucose-Capillary 01/20/2022 344 (H)  70 - 99 mg/dL Final   Glucose reference range applies only to samples taken after fasting for at least 8 hours.   Comment 1 01/20/2022 Notify RN   Final   Comment 2 01/20/2022 Document in Chart   Final   Glucose-Capillary 01/20/2022 479 (H)  70 - 99 mg/dL Final   Glucose reference range applies only to samples taken after fasting for at least 8 hours.   Glucose-Capillary 01/21/2022 243 (H)  70 - 99 mg/dL Final   Glucose reference range applies only to samples taken after fasting for at least 8 hours.   Glucose-Capillary 01/21/2022 224 (H)  70 - 99 mg/dL Final   Glucose reference range applies only to samples taken after fasting for at least 8 hours.   Glucose-Capillary 01/21/2022 282 (H)  70 - 99 mg/dL Final   Glucose reference range applies only to samples taken after fasting for at least 8 hours.  Admission on 01/18/2022, Discharged on 01/19/2022  Component Date Value Ref Range Status   WBC 01/18/2022 5.3  4.0 - 10.5 K/uL Final   RBC 01/18/2022 4.47  4.22 - 5.81 MIL/uL Final   Hemoglobin 01/18/2022 13.7  13.0 - 17.0 g/dL Final   HCT 01/18/2022 41.0  39.0 - 52.0 % Final   MCV 01/18/2022 91.7  80.0 - 100.0 fL Final   MCH 01/18/2022 30.6  26.0 - 34.0 pg Final   MCHC 01/18/2022 33.4  30.0 - 36.0 g/dL Final   RDW 01/18/2022 14.2  11.5 - 15.5 % Final   Platelets 01/18/2022 445 (H)  150 - 400 K/uL Final   nRBC 01/18/2022 0.0  0.0 - 0.2 % Final   Performed at [facility], [place] [address] [place], [place] [address]   Sodium 01/18/2022 136  135 - 145 mmol/L Final   Potassium 01/18/2022 4.3  3.5 - 5.1 mmol/L  Final   Chloride 01/18/2022 100  98 - 111 mmol/L Final   CO2 01/18/2022 22  22 - 32 mmol/L Final   Glucose, Bld 01/18/2022 499 (H)  70 - 99 mg/dL Final   Glucose reference range applies only to samples taken after fasting for at least 8 hours.   BUN 01/18/2022 9  8 - 23 mg/dL Final   Creatinine, Ser 01/18/2022 1.23  0.61 - 1.24 mg/dL Final   Calcium 01/18/2022 9.1  8.9 - 10.3 mg/dL Final   Total Protein 01/18/2022 6.6  6.5 -  8.1 g/dL Final   Albumin 01/18/2022 3.2 (L)  3.5 - 5.0 g/dL Final   AST 01/18/2022 18  15 - 41 U/L Final   ALT 01/18/2022 9  0 - 44 U/L Final   Alkaline Phosphatase 01/18/2022 95  38 - 126 U/L Final   Total Bilirubin 01/18/2022 0.3  0.3 - 1.2 mg/dL Final   GFR, Estimated 01/18/2022 >60  >60 mL/min Final   Comment: (NOTE) Calculated using the CKD-EPI Creatinine Equation (2021)    Anion gap 01/18/2022 14  5 - 15 Final   Performed at [facility], [place] [address] [place], [place] [address]   Color, Urine 01/18/2022 YELLOW  YELLOW Final   APPearance 01/18/2022 CLEAR  CLEAR Final   Specific Gravity, Urine 01/18/2022 1.025  1.005 - 1.030 Final   pH 01/18/2022 6.0  5.0 - 8.0 Final   Glucose, UA 01/18/2022 >=500 (A)  NEGATIVE mg/dL Final   Hgb urine dipstick 01/18/2022 NEGATIVE  NEGATIVE Final   Bilirubin Urine 01/18/2022 NEGATIVE  NEGATIVE Final   Ketones, ur 01/18/2022 NEGATIVE  NEGATIVE mg/dL Final   Protein, ur 01/18/2022 NEGATIVE  NEGATIVE mg/dL Final   Nitrite 01/18/2022 NEGATIVE  NEGATIVE Final   Leukocytes,Ua 01/18/2022 SMALL (A)  NEGATIVE Final   RBC / HPF 01/18/2022 0-5  0 - 5 RBC/hpf Final   WBC, UA 01/18/2022 21-50  0 - 5 WBC/hpf Final   Bacteria, UA 01/18/2022 RARE (A)  NONE SEEN Final   Squamous Epithelial / LPF 01/18/2022 0-5  0 - 5 Final   Budding Yeast 01/18/2022 PRESENT   Final   Performed at [facility], [place] [address], [place], [place] [address]   Opiates 01/18/2022 NONE DETECTED  NONE DETECTED Final   Cocaine 01/18/2022 POSITIVE (A)   NONE DETECTED Final   Benzodiazepines 01/18/2022 NONE DETECTED  NONE DETECTED Final   Amphetamines 01/18/2022 NONE DETECTED  NONE DETECTED Final   Tetrahydrocannabinol 01/18/2022 NONE DETECTED  NONE DETECTED Final   Barbiturates 01/18/2022 NONE DETECTED  NONE DETECTED Final   Comment: (NOTE) DRUG SCREEN FOR MEDICAL PURPOSES ONLY.  IF CONFIRMATION IS NEEDED FOR ANY PURPOSE, NOTIFY LAB WITHIN 5 DAYS.  LOWEST DETECTABLE LIMITS FOR URINE DRUG SCREEN Drug Class                     Cutoff (ng/mL) Amphetamine and metabolites    1000 Barbiturate and metabolites    200 Benzodiazepine                 200 Opiates and metabolites        300 Cocaine and metabolites        300 THC                            50 Performed at [facility], [place] [address], [place],  [address]    Glucose-Capillary 01/18/2022 483 (H)  70 - 99 mg/dL Final   Glucose reference range applies only to samples taken after fasting for at least 8 hours.   Glucose-Capillary 01/19/2022 586 (HH)  70 - 99 mg/dL Final   Glucose reference range applies only to samples taken after fasting for at least 8 hours.   Glucose-Capillary 01/19/2022 456 (H)  70 - 99 mg/dL Final   Glucose reference range applies only to samples taken after fasting for at least 8 hours.   Glucose-Capillary 01/19/2022 515 (HH)  70 - 99 mg/dL Final  Glucose reference range applies only to samples taken after fasting for at least 8 hours.   Comment 1 01/19/2022 Notify RN   Final   Glucose-Capillary 01/19/2022 164 (H)  70 - 99 mg/dL Final   Glucose reference range applies only to samples taken after fasting for at least 8 hours.  Admission on 01/07/2022, Discharged on 01/07/2022  Component Date Value Ref Range Status   Glucose-Capillary 01/07/2022 267 (H)  70 - 99 mg/dL Final   Glucose reference range applies only to samples taken after fasting for at least 8 hours.  Admission on 01/02/2022, Discharged on 01/02/2022  Component Date Value Ref  Range Status   WBC 01/02/2022 7.0  4.0 - 10.5 K/uL Final   RBC 01/02/2022 4.54  4.22 - 5.81 MIL/uL Final   Hemoglobin 01/02/2022 13.9  13.0 - 17.0 g/dL Final   HCT 01/02/2022 41.4  39.0 - 52.0 % Final   MCV 01/02/2022 91.2  80.0 - 100.0 fL Final   MCH 01/02/2022 30.6  26.0 - 34.0 pg Final   MCHC 01/02/2022 33.6  30.0 - 36.0 g/dL Final   RDW 01/02/2022 14.1  11.5 - 15.5 % Final   Platelets 01/02/2022 369  150 - 400 K/uL Final   nRBC 01/02/2022 0.0  0.0 - 0.2 % Final   Neutrophils Relative % 01/02/2022 59  % Final   Neutro Abs 01/02/2022 4.1  1.7 - 7.7 K/uL Final   Lymphocytes Relative 01/02/2022 28  % Final   Lymphs Abs 01/02/2022 1.9  0.7 - 4.0 K/uL Final   Monocytes Relative 01/02/2022 8  % Final   Monocytes Absolute 01/02/2022 0.5  0.1 - 1.0 K/uL Final   Eosinophils Relative 01/02/2022 4  % Final   Eosinophils Absolute 01/02/2022 0.3  0.0 - 0.5 K/uL Final   Basophils Relative 01/02/2022 1  % Final   Basophils Absolute 01/02/2022 0.1  0.0 - 0.1 K/uL Final   Immature Granulocytes 01/02/2022 0  % Final   Abs Immature Granulocytes 01/02/2022 0.02  0.00 - 0.07 K/uL Final   Performed at [facility], [place] [address] [place], [place] [address]   Sodium 01/02/2022 138  135 - 145 mmol/L Final   Potassium 01/02/2022 4.1  3.5 - 5.1 mmol/L Final   Chloride 01/02/2022 104  98 - 111 mmol/L Final   CO2 01/02/2022 25  22 - 32 mmol/L Final   Glucose, Bld 01/02/2022 216 (H)  70 - 99 mg/dL Final   Glucose reference range applies only to samples taken after fasting for at least 8 hours.   BUN 01/02/2022 11  8 - 23 mg/dL Final   Creatinine, Ser 01/02/2022 0.98  0.61 - 1.24 mg/dL Final   Calcium 01/02/2022 9.5  8.9 - 10.3 mg/dL Final   Total Protein 01/02/2022 7.4  6.5 - 8.1 g/dL Final   Albumin 01/02/2022 3.4 (L)  3.5 - 5.0 g/dL Final   AST 01/02/2022 12 (L)  15 - 41 U/L Final   ALT 01/02/2022 11  0 - 44 U/L Final   Alkaline Phosphatase 01/02/2022 90  38 - 126 U/L Final   Total Bilirubin  01/02/2022 0.9  0.3 - 1.2 mg/dL Final   GFR, Estimated 01/02/2022 >60  >60 mL/min Final   Comment: (NOTE) Calculated using the CKD-EPI Creatinine Equation (2021)    Anion gap 01/02/2022 9  5 - 15 Final   Performed at [place] [address], [place], [place] [address]  [facility] on 11/01/2021  Component Date Value Ref Range Status   POC Glucose 11/27/2021 301 (A)  70 - 99 mg/dl Final   1 hr after lunch, had not taken insulin  Admission on 10/13/2021, Discharged on 10/15/2021  Component Date Value Ref Range Status   Glucose-Capillary 10/13/2021 388 (H)  70 - 99 mg/dL Final   Glucose reference range applies only to samples taken after fasting for at least 8 hours.   SARS Coronavirus 2 by RT PCR 10/13/2021 NEGATIVE  NEGATIVE Final   Comment: (NOTE) SARS-CoV-2 target nucleic acids are NOT DETECTED.  The SARS-CoV-2 RNA is generally detectable in upper respiratory specimens during the acute phase of infection. The lowest concentration of SARS-CoV-2 viral copies this assay can detect is 138 copies/mL. A negative result does not preclude SARS-Cov-2 infection and should not be used as the sole basis for treatment or other patient management decisions. A negative result may occur with  improper specimen collection/handling, submission of specimen other than nasopharyngeal swab, presence of viral mutation(s) within the areas targeted by this assay, and inadequate number of viral copies(<138 copies/mL). A negative result must be combined with clinical observations, patient history, and epidemiological information. The expected result is Negative.  Fact Sheet for Patients:  EntrepreneurPulse.com.au  Fact Sheet for Healthcare Providers:  IncredibleEmployment.be  This test is no                          t yet approved or cleared by the [place] FDA and  has been authorized for detection and/or diagnosis of SARS-CoV-2  by FDA under an Emergency Use Authorization (EUA). This EUA will remain  in effect (meaning this test can be used) for the duration of the COVID-19 declaration under Section 564(b)(1) of the Act, 21 U.S.C.section 360bbb-3(b)(1), unless the authorization is terminated  or revoked sooner.       Influenza A by PCR 10/13/2021 NEGATIVE  NEGATIVE Final   Influenza B by PCR 10/13/2021 NEGATIVE  NEGATIVE Final   Comment: (NOTE) The Xpert Xpress SARS-CoV-2/FLU/RSV plus assay is intended as an aid in the diagnosis of influenza from Nasopharyngeal swab specimens and should not be used as a sole basis for treatment. Nasal washings and aspirates are unacceptable for Xpert Xpress SARS-CoV-2/FLU/RSV testing.  Fact Sheet for Patients: EntrepreneurPulse.com.au  Fact Sheet for Healthcare Providers: IncredibleEmployment.be  This test is not yet approved or cleared by the [place] FDA and has been authorized for detection and/or diagnosis of SARS-CoV-2 by FDA under an Emergency Use Authorization (EUA). This EUA will remain in effect (meaning this test can be used) for the duration of the COVID-19 declaration under Section 564(b)(1) of the Act, 21 U.S.C. section 360bbb-3(b)(1), unless the authorization is terminated or revoked.  Performed at [facility], [place] [address], [place], [place] [address]    WBC 10/13/2021 9.5  4.0 - 10.5 K/uL Final   RBC 10/13/2021 4.23  4.22 - 5.81 MIL/uL Final   Hemoglobin 10/13/2021 13.0  13.0 - 17.0 g/dL Final   HCT 10/13/2021 40.4  39.0 - 52.0 % Final   MCV 10/13/2021 95.5  80.0 - 100.0 fL Final   MCH 10/13/2021 30.7  26.0 - 34.0 pg Final   MCHC 10/13/2021 32.2  30.0 - 36.0 g/dL Final   RDW 10/13/2021 13.4  11.5 - 15.5 % Final   Platelets 10/13/2021 551 (H)  150 - 400 K/uL Final   nRBC 10/13/2021 0.0  0.0 - 0.2 % Final   Neutrophils  Relative % 10/13/2021 68  % Final   Neutro Abs 10/13/2021 6.5  1.7 - 7.7 K/uL Final    Lymphocytes Relative 10/13/2021 18  % Final   Lymphs Abs 10/13/2021 1.7  0.7 - 4.0 K/uL Final   Monocytes Relative 10/13/2021 9  % Final   Monocytes Absolute 10/13/2021 0.8  0.1 - 1.0 K/uL Final   Eosinophils Relative 10/13/2021 3  % Final   Eosinophils Absolute 10/13/2021 0.3  0.0 - 0.5 K/uL Final   Basophils Relative 10/13/2021 1  % Final   Basophils Absolute 10/13/2021 0.1  0.0 - 0.1 K/uL Final   Immature Granulocytes 10/13/2021 1  % Final   Abs Immature Granulocytes 10/13/2021 0.11 (H)  0.00 - 0.07 K/uL Final   Performed at [facility], [place] [address] [place], [place] [address]   Sodium 10/13/2021 136  135 - 145 mmol/L Final   Potassium 10/13/2021 4.3  3.5 - 5.1 mmol/L Final   Chloride 10/13/2021 104  98 - 111 mmol/L Final   CO2 10/13/2021 24  22 - 32 mmol/L Final   Glucose, Bld 10/13/2021 296 (H)  70 - 99 mg/dL Final   Glucose reference range applies only to samples taken after fasting for at least 8 hours.   BUN 10/13/2021 14  8 - 23 mg/dL Final   Creatinine, Ser 10/13/2021 1.15  0.61 - 1.24 mg/dL Final   Calcium 10/13/2021 9.0  8.9 - 10.3 mg/dL Final   Total Protein 10/13/2021 7.5  6.5 - 8.1 g/dL Final   Albumin 10/13/2021 3.0 (L)  3.5 - 5.0 g/dL Final   AST 10/13/2021 13 (L)  15 - 41 U/L Final   ALT 10/13/2021 10  0 - 44 U/L Final   Alkaline Phosphatase 10/13/2021 84  38 - 126 U/L Final   Total Bilirubin 10/13/2021 0.5  0.3 - 1.2 mg/dL Final   GFR, Estimated 10/13/2021 >60  >60 mL/min Final   Comment: (NOTE) Calculated using the CKD-EPI Creatinine Equation (2021)    Anion gap 10/13/2021 8  5 - 15 Final   Performed at [facility], [place] [address], [place], [place] [address]   Color, Urine 10/13/2021 YELLOW  YELLOW Final   APPearance 10/13/2021 CLOUDY (A)  CLEAR Final   Specific Gravity, Urine 10/13/2021 1.022  1.005 - 1.030 Final   pH 10/13/2021 5.0  5.0 - 8.0 Final   Glucose, UA 10/13/2021 >=500 (A)  NEGATIVE mg/dL Final   Hgb urine dipstick 10/13/2021  LARGE (A)  NEGATIVE Final   Bilirubin Urine 10/13/2021 NEGATIVE  NEGATIVE Final   Ketones, ur 10/13/2021 NEGATIVE  NEGATIVE mg/dL Final   Protein, ur 10/13/2021 30 (A)  NEGATIVE mg/dL Final   Nitrite 10/13/2021 NEGATIVE  NEGATIVE Final   Leukocytes,Ua 10/13/2021 LARGE (A)  NEGATIVE Final   RBC / HPF 10/13/2021 >50 (H)  0 - 5 RBC/hpf Final   WBC, UA 10/13/2021 >50 (H)  0 - 5 WBC/hpf Final   Bacteria, UA 10/13/2021 RARE (A)  NONE SEEN Final   Squamous Epithelial / LPF 10/13/2021 0-5  0 - 5 Final   WBC Clumps 10/13/2021 PRESENT   Final   Mucus 10/13/2021 PRESENT   Final   Budding Yeast 10/13/2021 PRESENT   Final   Non Squamous Epithelial 10/13/2021 0-5 (A)  NONE SEEN Final   Performed at [facility], [place] [address] [place], [place] [address]   pH, Ven 10/13/2021 7.457 (H)  7.25 - 7.43 Final   pCO2, Ven 10/13/2021  38.1 (L)  44 - 60 mmHg Final   pO2, Ven 10/13/2021 174 (H)  32 - 45 mmHg Final   Bicarbonate 10/13/2021 26.9  20.0 - 28.0 mmol/L Final   TCO2 10/13/2021 28  22 - 32 mmol/L Final   O2 Saturation 10/13/2021 100  % Final   Acid-Base Excess 10/13/2021 3.0 (H)  0.0 - 2.0 mmol/L Final   Sodium 10/13/2021 137  135 - 145 mmol/L Final   Potassium 10/13/2021 4.3  3.5 - 5.1 mmol/L Final   Calcium, Ion 10/13/2021 1.10 (L)  1.15 - 1.40 mmol/L Final   HCT 10/13/2021 40.0  39.0 - 52.0 % Final   Hemoglobin 10/13/2021 13.6  13.0 - 17.0 g/dL Final   Sample type 10/13/2021 VENOUS   Final   Opiates 10/13/2021 NONE DETECTED  NONE DETECTED Final   Cocaine 10/13/2021 POSITIVE (A)  NONE DETECTED Final   Benzodiazepines 10/13/2021 NONE DETECTED  NONE DETECTED Final   Amphetamines 10/13/2021 NONE DETECTED  NONE DETECTED Final   Tetrahydrocannabinol 10/13/2021 NONE DETECTED  NONE DETECTED Final   Barbiturates 10/13/2021 NONE DETECTED  NONE DETECTED Final   Comment: (NOTE) DRUG SCREEN FOR MEDICAL PURPOSES ONLY.  IF CONFIRMATION IS NEEDED FOR ANY PURPOSE, NOTIFY LAB WITHIN 5 DAYS.  LOWEST  DETECTABLE LIMITS FOR URINE DRUG SCREEN Drug Class                     Cutoff (ng/mL) Amphetamine and metabolites    1000 Barbiturate and metabolites    200 Benzodiazepine                 203 Tricyclics and metabolites     300 Opiates and metabolites        300 Cocaine and metabolites        300 THC                            50 Performed at [facility], [place] [address] [place], [place] [address]    Adenovirus 10/13/2021 NOT DETECTED  NOT DETECTED Final   Coronavirus 229E 10/13/2021 NOT DETECTED  NOT DETECTED Final   Comment: (NOTE) The Coronavirus on the Respiratory Panel, DOES NOT test for the novel  Coronavirus (2019 nCoV)    Coronavirus HKU1 10/13/2021 NOT DETECTED  NOT DETECTED Final   Coronavirus NL63 10/13/2021 NOT DETECTED  NOT DETECTED Final   Coronavirus OC43 10/13/2021 NOT DETECTED  NOT DETECTED Final   Metapneumovirus 10/13/2021 NOT DETECTED  NOT DETECTED Final   Rhinovirus / Enterovirus 10/13/2021 NOT DETECTED  NOT DETECTED Final   Influenza A 10/13/2021 NOT DETECTED  NOT DETECTED Final   Influenza B 10/13/2021 NOT DETECTED  NOT DETECTED Final   Parainfluenza Virus 1 10/13/2021 NOT DETECTED  NOT DETECTED Final   Parainfluenza Virus 2 10/13/2021 NOT DETECTED  NOT DETECTED Final   Parainfluenza Virus 3 10/13/2021 NOT DETECTED  NOT DETECTED Final   Parainfluenza Virus 4 10/13/2021 NOT DETECTED  NOT DETECTED Final   Respiratory Syncytial Virus 10/13/2021 NOT DETECTED  NOT DETECTED Final   Bordetella pertussis 10/13/2021 NOT DETECTED  NOT DETECTED Final   Bordetella Parapertussis 10/13/2021 NOT DETECTED  NOT DETECTED Final   Chlamydophila pneumoniae 10/13/2021 NOT DETECTED  NOT DETECTED Final   Mycoplasma pneumoniae 10/13/2021 NOT DETECTED  NOT DETECTED Final   Performed at [facility], [place] [address], [place], [place] [address]   Procalcitonin 10/13/2021 <0.10  ng/mL Final  Comment:        Interpretation: PCT (Procalcitonin) <= 0.5 ng/mL: Systemic  infection (sepsis) is not likely. Local bacterial infection is possible. (NOTE)       Sepsis PCT Algorithm           Lower Respiratory Tract                                      Infection PCT Algorithm    ----------------------------     ----------------------------         PCT < 0.25 ng/mL                PCT < 0.10 ng/mL          Strongly encourage             Strongly discourage   discontinuation of antibiotics    initiation of antibiotics    ----------------------------     -----------------------------       PCT 0.25 - 0.50 ng/mL            PCT 0.10 - 0.25 ng/mL               OR       >80% decrease in PCT            Discourage initiation of                                            antibiotics      Encourage discontinuation           of antibiotics    ----------------------------     -----------------------------         PCT >= 0.50 ng/mL              PCT 0.26 - 0.50 ng/mL               AND                                 <80% decrease in PCT             Encourage initiation of                                             antibiotics       Encourage continuation           of antibiotics    ----------------------------     -----------------------------        PCT >= 0.50 ng/mL                  PCT > 0.50 ng/mL               AND         increase in PCT                  Strongly encourage                                      initiation of antibiotics  Strongly encourage escalation           of antibiotics                                     -----------------------------                                           PCT <= 0.25 ng/mL                                                 OR                                        > 80% decrease in PCT                                      Discontinue / Do not initiate                                             antibiotics  Performed at [facility], [place] [address] [place], [place] [address]    B Natriuretic Peptide 10/13/2021  23.6  0.0 - 100.0 pg/mL Final   Performed at [place] [address] [place], [place] [address]   L. pneumophila Serogp 1 Ur Ag 10/13/2021 Negative  Negative Final   Comment: (NOTE) Presumptive negative for L. pneumophila serogroup 1 antigen in urine, suggesting no recent or current infection. Legionnaires' disease cannot be ruled out since other serogroups and species may also cause disease. Performed At: [facility] [place], [place] [id number] Rush Farmer MD [id number]    Source of Sample 10/13/2021 URINE, RANDOM   Final   Performed at [facility], [place] [address], [place], [place] [address]   Strep Pneumo Urinary Antigen 10/13/2021 NEGATIVE  NEGATIVE Final   Comment:        Infection due to S. pneumoniae cannot be absolutely ruled out since the antigen present may be below the detection limit of the test. Performed at [facility], [address], [place], [place] [address]    Sodium 10/14/2021 138  135 - 145 mmol/L Final   Potassium 10/14/2021 4.3  3.5 - 5.1 mmol/L Final   Chloride 10/14/2021 106  98 - 111 mmol/L Final   CO2 10/14/2021 25  22 - 32 mmol/L Final   Glucose, Bld 10/14/2021 286 (H)  70 - 99 mg/dL Final   Glucose reference range applies only to samples taken after fasting for at least 8 hours.   BUN 10/14/2021 14  8 - 23 mg/dL Final   Creatinine, Ser 10/14/2021 0.98  0.61 - 1.24 mg/dL Final   Calcium 10/14/2021 8.9  8.9 - 10.3 mg/dL Final   GFR, Estimated 10/14/2021 >60  >60 mL/min Final   Comment: (NOTE) Calculated using the CKD-EPI Creatinine Equation (2021)  Anion gap 10/14/2021 7  5 - 15 Final   Performed at [place] [address] [place], [place] [address]   WBC 10/14/2021 9.6  4.0 - 10.5 K/uL Final   RBC 10/14/2021 4.12 (L)  4.22 - 5.81 MIL/uL Final   Hemoglobin 10/14/2021 12.6 (L)  13.0 - 17.0 g/dL Final   HCT 10/14/2021 38.8 (L)  39.0 - 52.0 % Final   MCV 10/14/2021 94.2  80.0 - 100.0  fL Final   MCH 10/14/2021 30.6  26.0 - 34.0 pg Final   MCHC 10/14/2021 32.5  30.0 - 36.0 g/dL Final   RDW 10/14/2021 13.4  11.5 - 15.5 % Final   Platelets 10/14/2021 556 (H)  150 - 400 K/uL Final   nRBC 10/14/2021 0.0  0.0 - 0.2 % Final   Performed at [facility], [place] [address], [place], [place] [address]   Glucose-Capillary 10/13/2021 281 (H)  70 - 99 mg/dL Final   Glucose reference range applies only to samples taken after fasting for at least 8 hours.   Glucose-Capillary 10/13/2021 399 (H)  70 - 99 mg/dL Final   Glucose reference range applies only to samples taken after fasting for at least 8 hours.   Glucose-Capillary 10/14/2021 239 (H)  70 - 99 mg/dL Final   Glucose reference range applies only to samples taken after fasting for at least 8 hours.   Glucose-Capillary 10/14/2021 345 (H)  70 - 99 mg/dL Final   Glucose reference range applies only to samples taken after fasting for at least 8 hours.   Glucose-Capillary 10/14/2021 383 (H)  70 - 99 mg/dL Final   Glucose reference range applies only to samples taken after fasting for at least 8 hours.   Glucose-Capillary 10/15/2021 194 (H)  70 - 99 mg/dL Final   Glucose reference range applies only to samples taken after fasting for at least 8 hours.   Glucose-Capillary 10/15/2021 161 (H)  70 - 99 mg/dL Final   Glucose reference range applies only to samples taken after fasting for at least 8 hours.   Glucose-Capillary 10/15/2021 312 (H)  70 - 99 mg/dL Final   Glucose reference range applies only to samples taken after fasting for at least 8 hours.   Glucose-Capillary 10/14/2021 279 (H)  70 - 99 mg/dL Final   Glucose reference range applies only to samples taken after fasting for at least 8 hours.  There may be more visits with results that are not included.    Allergies: Lisinopril  PTA Medications:  No current facility-administered medications on file prior to encounter.   Current Outpatient Medications on File Prior to  Encounter  Medication Sig Dispense Refill   Accu-Chek FastClix Lancets MISC Use as directed 100 each 0   Accu-Chek Softclix Lancets lancets Use up to 4 times daily as directed 100 each 0   amLODipine (NORVASC) 10 MG tablet Take 1 tablet (10 mg total) by mouth daily. 30 tablet 0   atorvastatin (LIPITOR) 40 MG tablet Take 1 tablet (40 mg total) by mouth daily. 30 tablet 0   Blood Glucose Monitoring Suppl (ACCU-CHEK GUIDE) w/Device KIT USE AS DIRECTED TO CHECK BLOOD SUGARS UP TO TWICE PER DAY 1 kit 0   DULoxetine (CYMBALTA) 60 MG capsule Take 1 capsule (60 mg total) by mouth daily. 30 capsule 0   Emollient (LUBRIDERM SERIOUSLY SENSITIVE) LOTN Apply 1 Application topically 2 (two) times daily. 473 mL 0   famotidine (PEPCID) 10 MG tablet Take 1 tablet (  10 mg total) by mouth 2 (two) times daily. 60 tablet 1   feeding supplement, GLUCERNA SHAKE, (GLUCERNA SHAKE) LIQD Take 237 mLs by mouth 3 (three) times daily between meals. 42660 mL 0   gabapentin (NEURONTIN) 300 MG capsule Take 2 capsules (600 mg total) by mouth 3 (three) times daily. 180 capsule 0   glipiZIDE (GLUCOTROL XL) 10 MG 24 hr tablet Take 1 tablet (10 mg total) by mouth daily with breakfast. 30 tablet 0   glucose blood (ACCU-CHEK GUIDE) test strip Use up to 3 times daily as directed 100 each 3   insulin aspart (NOVOLOG) 100 UNIT/ML injection Inject 3 Units into the skin 2 (two) times daily before lunch and supper. 10 mL 0   Insulin Glargine Solostar (LANTUS) 100 UNIT/ML Solostar Pen Inject 24 Units into the skin daily. 15 mL 2   Insulin Pen Needle 31G X 8 MM MISC Use with insulin pen 100 each 1   melatonin 3 MG TABS tablet Take 1 tablet (3 mg total) by mouth at bedtime. 30 tablet 0   metFORMIN (GLUCOPHAGE) 1000 MG tablet Take 1 tablet (1,000 mg total) by mouth 2 (two) times daily. 60 tablet 0   nicotine (NICODERM CQ - DOSED IN MG/24 HOURS) 21 mg/24hr patch Place 1 patch (21 mg total) onto the skin daily at 6 (six) AM. 28 patch 0   tamsulosin  (FLOMAX) 0.4 MG CAPS capsule Take 2 capsules (0.8 mg total) by mouth daily. For renal calculi 60 capsule 0   traZODone (DESYREL) 100 MG tablet Take 1 tablet (100 mg total) by mouth at bedtime as needed for sleep. 15 tablet 0   [DISCONTINUED] loratadine (CLARITIN) 10 MG tablet Take 1 tablet (10 mg total) by mouth daily. (Patient not taking: No sig reported) 30 tablet 0     Long Term Goals: Improvement in symptoms so as ready for discharge  Short Term Goals: Patient will verbalize feelings in meetings with treatment team members., Patient will attend at least of 50% of the groups daily., and Pt will complete the PHQ9 on admission, day 3 and discharge.  Medical Decision Making  Ousmane Seeman is a 61 y/o male presenting voluntarily to [facility] for substance abuse treatment. Patient report that he wants detox treatment because, "I am trying to better my life".  Patient states he went to [facility] seeking treatment for his substance abuse and was told that he needed to come to [facility] for treatment.    Recommendations  Based on my evaluation the patient does not appear to have an emergency medical condition.  Patient will be admitted to [facility] for crisis management, safety and stabilization.  Lucia Bitter, NP 03/03/22  7:38 AM

## 2022-03-03 NOTE — ED Provider Notes (Incomplete)
Facility Based Crisis Admission H&P  Date: 03/03/22 Patient Name: David Wyatt MRN: [id number] Chief Complaint:  Chief Complaint  Patient presents with  . Addiction Problem      Diagnoses:  Final diagnoses:  None    HPI: David Wyatt is a 61 year old male presenting voluntarily to [facility]  PHQ 2-9:  [place] ED from 03/02/2022 in [facility] Office Visit from 10/08/2021 in [place] Office Visit from 09/05/2021 in [place] [facility]  Thoughts that you would be better off dead, or of hurting yourself in some way Not at all Not at all Not at all  PHQ-9 Total Score _0 Flowsheet Row ED from 03/02/2022 in [facility] Most recent reading at 03/02/2022 11:15 PM ED from 03/01/2022 in [place] Most recent reading at 03/01/2022  5:43 PM ED from 03/01/2022 in [place] Most recent reading at 03/01/2022 12:10 AM  C-SSRS RISK CATEGORY Error: Q3, 4, or 5 should not be populated when Q2 is No No Risk No Risk        Total Time spent with patient: 30 minutes  Musculoskeletal  Strength & Muscle Tone: within normal limits Gait & Station: normal Patient leans: N/A  Psychiatric Specialty Exam  Presentation General Appearance:  Casual  Eye Contact: Good  Speech: Clear and Coherent  Speech Volume: Normal  Handedness: Right   Mood and Affect  Mood: Depressed  Affect: Appropriate; Depressed   Thought Process  Thought Processes: Coherent; Goal Directed  Descriptions of Associations:Intact  Orientation:Full (Time, Place and Person)  Thought Content:WDL  Diagnosis of Schizophrenia or Schizoaffective disorder in past: No   Hallucinations:Hallucinations: None  Ideas of Reference:None  Suicidal Thoughts:Suicidal Thoughts: No  Homicidal Thoughts:Homicidal Thoughts:  No   Sensorium  Memory: Immediate Good; Recent Good; Remote Good  Judgment: Fair  Insight: Fair   Community education officer  Concentration: Good  Attention Span: Good  Recall: Good  Fund of Knowledge: Good  Language: Good   Psychomotor Activity  Psychomotor Activity: Psychomotor Activity: Normal   Assets  Assets: Communication Skills; Desire for Improvement; Resilience   Sleep  Sleep: Sleep: Fair Number of Hours of Sleep: -1   Nutritional Assessment (For OBS and FBC admissions only) Has the patient had a weight loss or gain of 10 pounds or more in the last 3 months?: No Has the patient had a decrease in food intake/or appetite?: No Does the patient have dental problems?: No Does the patient have eating habits or behaviors that may be indicators of an eating disorder including binging or inducing vomiting?: No Has the patient recently lost weight without trying?: 0 Has the patient been eating poorly because of a decreased appetite?: 0 Malnutrition Screening Tool Score: 0    Physical Exam HENT:     Head: Normocephalic.     Nose: Nose normal.  Eyes:     Pupils: Pupils are equal, round, and reactive to light.  Cardiovascular:     Rate and Rhythm: Normal rate.  Pulmonary:     Effort: Pulmonary effort is normal.  Abdominal:     General: Abdomen is flat.  Musculoskeletal:        General: Normal range of motion.     Cervical back: Normal range of motion.  Skin:    General: Skin is warm.  Neurological:     Mental Status: He is alert  and oriented to person, place, and time.  Psychiatric:        Attention and Perception: Attention normal.        Mood and Affect: Mood is depressed.        Speech: Speech normal.        Behavior: Behavior is cooperative.        Thought Content: Thought content is not paranoid or delusional. Thought content does not include homicidal or suicidal ideation. Thought content does not include homicidal or suicidal plan.         Cognition and Memory: Cognition normal.        Judgment: Judgment is impulsive.    Review of Systems  Constitutional: Negative.   HENT: Negative.    Eyes: Negative.   Respiratory: Negative.    Cardiovascular: Negative.   Gastrointestinal: Negative.   Genitourinary:  Positive for urgency.  Skin: Negative.   Neurological: Negative.   Endo/Heme/Allergies: Negative.   Psychiatric/Behavioral:  Positive for depression and substance abuse.     Blood pressure 133/89, pulse 80, temperature 99 F (37.2 C), temperature source Oral, resp. rate 20, SpO2 98 %. There is no height or weight on file to calculate BMI.  Past Psychiatric History: ***   Is the patient at risk to self? No  Has the patient been a risk to self in the past 6 months? No .    Has the patient been a risk to self within the distant past? No   Is the patient a risk to others? No   Has the patient been a risk to others in the past 6 months? No   Has the patient been a risk to others within the distant past? No   Past Medical History:  Past Medical History:  Diagnosis Date  . Angioedema of lips 07/28/2012   left upper (07/29/2012)  . Arthritis    hands and back  . Chronic back pain   . Cocaine abuse ([place])   . Diabetic neuropathy ([place])   . High cholesterol   . Hypertension   . Neuropathy   . Type II diabetes mellitus ([place])     Past Surgical History:  Procedure Laterality Date  . BACK SURGERY    . CYSTOSCOPY W/ URETERAL STENT PLACEMENT Right 02/23/2021   Procedure: CYSTOSCOPY WITH RETROGRADE PYELOGRAM/URETERAL STENT PLACEMENT;  Surgeon: Janith Lima, MD;  Location: [facility];  Service: Urology;  Laterality: Right;  . HERNIA REPAIR Right 04/01/2012  . I & D EXTREMITY Left 12/13/2012   Procedure: IRRIGATION AND DEBRIDEMENT LEFT THUMB;  Surgeon: Tennis Must, MD;  Location: [place];  Service: Orthopedics;  Laterality: Left;  . INCISION AND DRAINAGE OF WOUND     boil on back/notes 07/14/2008 (07/29/2012)  . INGUINAL HERNIA  REPAIR  04/01/2012   Procedure: HERNIA REPAIR INGUINAL ADULT;  Surgeon: Haywood Lasso, MD;  Location: [place];  Service: General;  Laterality: Right;  . INGUINAL HERNIA REPAIR Left 09/06/2016   Procedure: OPEN REPAIR LEFT INGUINAL HERNIA;  Surgeon: [place] Pickerel, MD;  Location: [facility];  Service: General;  Laterality: Left;  . INSERTION OF MESH Left 09/06/2016   Procedure: INSERTION OF MESH;  Surgeon: [place] Pickerel, MD;  Location: [place];  Service: General;  Laterality: Left;  . TONSILLECTOMY      Family History:  Family History  Problem Relation Age of Onset  . Diabetes Mother   . Kidney disease Mother   . Hyperlipidemia Mother   . Hyperlipidemia Father   .  Diabetes Father     Social History:  Social History   Socioeconomic History  . Marital status: Divorced    Spouse name: Not on file  . Number of children: 3  . Years of education: 60  . Highest education level: Not on file  Occupational History    Comment: disabled  Tobacco Use  . Smoking status: Every Day    Packs/day: 0.25    Years: 30.00    Total pack years: 7.50    Types: Cigarettes  . Smokeless tobacco: Former    Quit date: 08/15/2015  . Tobacco comments:    04/29/16 2  cigs daily, 10/27/17 sometimes < .25 PPD  Vaping Use  . Vaping Use: Never used  Substance and Sexual Activity  . Alcohol use: No    Alcohol/week: 0.0 standard drinks of alcohol  . Drug use: Not Currently    Types: "Crack" cocaine  . Sexual activity: Not on file  Other Topics Concern  . Not on file  Social History Narrative   04/29/17   Lives in shelter   Caffeine- a lot of  tea, coffee   Social Determinants of Health   Financial Resource Strain: Not on file  Food Insecurity: Not on file  Transportation Needs: Not on file  Physical Activity: Not on file  Stress: Not on file  Social Connections: Not on file  Intimate Partner Violence: Not on file    SDOH:  SDOH Screenings   Depression (PHQ2-9): Medium Risk (03/03/2022)  Tobacco Use:  High Risk (03/01/2022)    Last Labs:  Admission on 03/01/2022, Discharged on 03/02/2022  Component Date Value Ref Range Status  . WBC 03/02/2022 6.5  4.0 - 10.5 K/uL Final  . RBC 03/02/2022 4.21 (L)  4.22 - 5.81 MIL/uL Final  . Hemoglobin 03/02/2022 12.8 (L)  13.0 - 17.0 g/dL Final  . HCT 03/02/2022 40.2  39.0 - 52.0 % Final  . MCV 03/02/2022 95.5  80.0 - 100.0 fL Final  . MCH 03/02/2022 30.4  26.0 - 34.0 pg Final  . MCHC 03/02/2022 31.8  30.0 - 36.0 g/dL Final  . RDW 03/02/2022 14.2  11.5 - 15.5 % Final  . Platelets 03/02/2022 372  150 - 400 K/uL Final  . nRBC 03/02/2022 0.0  0.0 - 0.2 % Final  . Neutrophils Relative % 03/02/2022 60  % Final  . Neutro Abs 03/02/2022 4.0  1.7 - 7.7 K/uL Final  . Lymphocytes Relative 03/02/2022 26  % Final  . Lymphs Abs 03/02/2022 1.7  0.7 - 4.0 K/uL Final  . Monocytes Relative 03/02/2022 8  % Final  . Monocytes Absolute 03/02/2022 0.5  0.1 - 1.0 K/uL Final  . Eosinophils Relative 03/02/2022 4  % Final  . Eosinophils Absolute 03/02/2022 0.2  0.0 - 0.5 K/uL Final  . Basophils Relative 03/02/2022 1  % Final  . Basophils Absolute 03/02/2022 0.0  0.0 - 0.1 K/uL Final  . Immature Granulocytes 03/02/2022 1  % Final  . Abs Immature Granulocytes 03/02/2022 0.03  0.00 - 0.07 K/uL Final   Performed at [facility], [place] [address] [place], [place] [address]  . Sodium 03/02/2022 130 (L)  135 - 145 mmol/L Final  . Potassium 03/02/2022 4.4  3.5 - 5.1 mmol/L Final  . Chloride 03/02/2022 96 (L)  98 - 111 mmol/L Final  . CO2 03/02/2022 24  22 - 32 mmol/L Final  . Glucose, Bld 03/02/2022 709 (HH)  70 - 99 mg/dL Final   Comment: CRITICAL  RESULT CALLED TO, READ BACK BY AND VERIFIED WITH J. NEWTON RN 03/02/22 _0  BY J. WHITE Glucose reference range applies only to samples taken after fasting for at least 8 hours.   . BUN 03/02/2022 16  8 - 23 mg/dL Final  . Creatinine, Ser 03/02/2022 1.36 (H)  0.61 - 1.24 mg/dL Final  . Calcium 03/02/2022 8.7 (L)  8.9 -  10.3 mg/dL Final  . Total Protein 03/02/2022 6.5  6.5 - 8.1 g/dL Final  . Albumin 03/02/2022 3.0 (L)  3.5 - 5.0 g/dL Final  . AST 03/02/2022 35  15 - 41 U/L Final  . ALT 03/02/2022 22  0 - 44 U/L Final  . Alkaline Phosphatase 03/02/2022 92  38 - 126 U/L Final  . Total Bilirubin 03/02/2022 0.2 (L)  0.3 - 1.2 mg/dL Final  . GFR, Estimated 03/02/2022 59 (L)  >60 mL/min Final   Comment: (NOTE) Calculated using the CKD-EPI Creatinine Equation (2021)   . Anion gap 03/02/2022 10  5 - 15 Final   Performed at [facility] [address], [place], [place] [address]  . Alcohol, Ethyl (B) 03/02/2022 <10  <10 mg/dL Final   Comment: (NOTE) Lowest detectable limit for serum alcohol is 10 mg/dL.  For medical purposes only. Performed at [facility], [place] [address], [place], [place] [address]   . Opiates 03/02/2022 NONE DETECTED  NONE DETECTED Final  . Cocaine 03/02/2022 POSITIVE (A)  NONE DETECTED Final  . Benzodiazepines 03/02/2022 NONE DETECTED  NONE DETECTED Final  . Amphetamines 03/02/2022 NONE DETECTED  NONE DETECTED Final  . Tetrahydrocannabinol 03/02/2022 NONE DETECTED  NONE DETECTED Final  . Barbiturates 03/02/2022 NONE DETECTED  NONE DETECTED Final   Comment: (NOTE) DRUG SCREEN FOR MEDICAL PURPOSES ONLY.  IF CONFIRMATION IS NEEDED FOR ANY PURPOSE, NOTIFY LAB WITHIN 5 DAYS.  LOWEST DETECTABLE LIMITS FOR URINE DRUG SCREEN Drug Class                     Cutoff (ng/mL) Amphetamine and metabolites    1000 Barbiturate and metabolites    200 Benzodiazepine                 200 Opiates and metabolites        300 Cocaine and metabolites        300 THC                            50 Performed at [facility], [place] [address] [place],  [address]   . Color, Urine 03/02/2022 STRAW (A)  YELLOW Final  . APPearance 03/02/2022 HAZY (A)  CLEAR Final  . Specific Gravity, Urine 03/02/2022 1.023  1.005 - 1.030 Final  . pH 03/02/2022 6.0  5.0 - 8.0 Final  . Glucose, UA  03/02/2022 >=500 (A)  NEGATIVE mg/dL Final  . Hgb urine dipstick 03/02/2022 NEGATIVE  NEGATIVE Final  . Bilirubin Urine 03/02/2022 NEGATIVE  NEGATIVE Final  . Ketones, ur 03/02/2022 NEGATIVE  NEGATIVE mg/dL Final  . Protein, ur 03/02/2022 NEGATIVE  NEGATIVE mg/dL Final  . Nitrite 03/02/2022 NEGATIVE  NEGATIVE Final  . Chalmers Guest 03/02/2022 LARGE (A)  NEGATIVE Final  . RBC / HPF 03/02/2022 0-5  0 - 5 RBC/hpf Final  . WBC, UA 03/02/2022 >50 (H)  0 - 5 WBC/hpf Final  . Bacteria, UA 03/02/2022 RARE (A)  NONE SEEN Final  . Squamous Epithelial / LPF 03/02/2022 0-5  0 - 5 Final  . WBC Clumps 03/02/2022 PRESENT   Final  . Mucus 03/02/2022 PRESENT   Final  . Budding Yeast 03/02/2022 PRESENT   Final   Performed at [facility], [place] [address], [place], [place] [address]  . SARS Coronavirus 2 by RT PCR 03/02/2022 NEGATIVE  NEGATIVE Final   Comment: (NOTE) SARS-CoV-2 target nucleic acids are NOT DETECTED.  The SARS-CoV-2 RNA is generally detectable in upper respiratory specimens during the acute phase of infection. The lowest concentration of SARS-CoV-2 viral copies this assay can detect is 138 copies/mL. A negative result does not preclude SARS-Cov-2 infection and should not be used as the sole basis for treatment or other patient management decisions. A negative result may occur with  improper specimen collection/handling, submission of specimen other than nasopharyngeal swab, presence of viral mutation(s) within the areas targeted by this assay, and inadequate number of viral copies(<138 copies/mL). A negative result must be combined with clinical observations, patient history, and epidemiological information. The expected result is Negative.  Fact Sheet for Patients:  EntrepreneurPulse.com.au  Fact Sheet for Healthcare Providers:  IncredibleEmployment.be  This test is no                          t yet approved or cleared by the [place]  FDA and  has been authorized for detection and/or diagnosis of SARS-CoV-2 by FDA under an Emergency Use Authorization (EUA). This EUA will remain  in effect (meaning this test can be used) for the duration of the COVID-19 declaration under Section 564(b)(1) of the Act, 21 U.S.C.section 360bbb-3(b)(1), unless the authorization is terminated  or revoked sooner.      . Influenza A by PCR 03/02/2022 NEGATIVE  NEGATIVE Final  . Influenza B by PCR 03/02/2022 NEGATIVE  NEGATIVE Final   Comment: (NOTE) The Xpert Xpress SARS-CoV-2/FLU/RSV plus assay is intended as an aid in the diagnosis of influenza from Nasopharyngeal swab specimens and should not be used as a sole basis for treatment. Nasal washings and aspirates are unacceptable for Xpert Xpress SARS-CoV-2/FLU/RSV testing.  Fact Sheet for Patients: EntrepreneurPulse.com.au  Fact Sheet for Healthcare Providers: IncredibleEmployment.be  This test is not yet approved or cleared by the [place] FDA and has been authorized for detection and/or diagnosis of SARS-CoV-2 by FDA under an Emergency Use Authorization (EUA). This EUA will remain in effect (meaning this test can be used) for the duration of the COVID-19 declaration under Section 564(b)(1) of the Act, 21 U.S.C. section 360bbb-3(b)(1), unless the authorization is terminated or revoked.    Marland Kitchen Resp Syncytial Virus by PCR 03/02/2022 NEGATIVE  NEGATIVE Final   Comment: (NOTE) Fact Sheet for Patients: EntrepreneurPulse.com.au  Fact Sheet for Healthcare Providers: IncredibleEmployment.be  This test is not yet approved or cleared by the [place] FDA and has been authorized for detection and/or diagnosis of SARS-CoV-2 by FDA under an Emergency Use Authorization (EUA). This EUA will remain in effect (meaning this test can be used) for the duration of the COVID-19 declaration under Section 564(b)(1) of the  Act, 21 U.S.C. section 360bbb-3(b)(1), unless the authorization is terminated or revoked.  Performed at [facility], [place] [address], [place], [place] [address]   . Glucose-Capillary 03/02/2022 >600 (HH)  70 - 99 mg/dL Final   Glucose reference range applies only to samples taken after fasting for at least 8 hours.  . Comment 1 03/02/2022 Document in Chart   Final  .  Glucose-Capillary 03/02/2022 241 (H)  70 - 99 mg/dL Final   Glucose reference range applies only to samples taken after fasting for at least 8 hours.  . Comment 1 03/02/2022 Document in Chart   Final  . Glucose-Capillary 03/02/2022 390 (H)  70 - 99 mg/dL Final   Glucose reference range applies only to samples taken after fasting for at least 8 hours.  Admission on 03/01/2022, Discharged on 03/01/2022  Component Date Value Ref Range Status  . Glucose-Capillary 03/01/2022 289 (H)  70 - 99 mg/dL Final   Glucose reference range applies only to samples taken after fasting for at least 8 hours.  Admission on 01/21/2022, Discharged on 02/06/2022  Component Date Value Ref Range Status  . Hgb A1c MFr Bld 01/21/2022 10.4 (H)  4.8 - 5.6 % Final   Comment: (NOTE) Pre diabetes:          5.7%-6.4%  Diabetes:              >6.4%  Glycemic control for   <7.0% adults with diabetes   . Mean Plasma Glucose 01/21/2022 251.78  mg/dL Final   Performed at [facility] [address], [place], [place] [address]  . TSH 01/21/2022 0.248 (L)  0.350 - 4.500 uIU/mL Final   Comment: Performed by a 3rd Generation assay with a functional sensitivity of <=0.01 uIU/mL. Performed at [facility], [place] [address], [place], [place] [address]   . Glucose-Capillary 01/21/2022 374 (H)  70 - 99 mg/dL Final   Glucose reference range applies only to samples taken after fasting for at least 8 hours.  . Glucose-Capillary 01/21/2022 289 (H)  70 - 99 mg/dL Final   Glucose reference range applies only to samples taken after fasting for at least  8 hours.  . Glucose-Capillary 01/22/2022 137 (H)  70 - 99 mg/dL Final   Glucose reference range applies only to samples taken after fasting for at least 8 hours.  . Glucose-Capillary 01/22/2022 297 (H)  70 - 99 mg/dL Final   Glucose reference range applies only to samples taken after fasting for at least 8 hours.  . T3, Free 01/23/2022 2.2  2.0 - 4.4 pg/mL Final   Comment: (NOTE) Performed At: [facility] [place], [place] [id number] Rush Farmer MD [id number]   . Glucose-Capillary 01/22/2022 94  70 - 99 mg/dL Final   Glucose reference range applies only to samples taken after fasting for at least 8 hours.  . Free T4 01/23/2022 0.72  0.61 - 1.12 ng/dL Final   Comment: (NOTE) Biotin ingestion may interfere with free T4 tests. If the results are inconsistent with the TSH level, previous test results, or the clinical presentation, then consider biotin interference. If needed, order repeat testing after stopping biotin. Performed at [facility], [place] [address] [place], [place] [address]   . TSH 01/23/2022 0.203 (L)  0.350 - 4.500 uIU/mL Final   Comment: Performed by a 3rd Generation assay with a functional sensitivity of <=0.01 uIU/mL. Performed at [facility], [place] [address] [place], [place] [address]   . Glucose-Capillary 01/23/2022 132 (H)  70 - 99 mg/dL Final   Glucose reference range applies only to samples taken after fasting for at least 8 hours.  . Glucose-Capillary 01/22/2022 59 (L)  70 - 99 mg/dL Final   Glucose reference range applies only to samples taken after fasting for at least 8 hours.  . Glucose-Capillary 01/22/2022 177 (H)  70 -  99 mg/dL Final   Glucose reference range applies only to samples taken after fasting for at least 8 hours.  . Glucose-Capillary 01/23/2022 284 (H)  70 - 99 mg/dL Final   Glucose reference range applies only to samples taken after fasting for at least 8 hours.  . Glucose-Capillary 01/23/2022 188 (H)   70 - 99 mg/dL Final   Glucose reference range applies only to samples taken after fasting for at least 8 hours.  . Glucose-Capillary 01/23/2022 294 (H)  70 - 99 mg/dL Final   Glucose reference range applies only to samples taken after fasting for at least 8 hours.  . Glucose-Capillary 01/24/2022 180 (H)  70 - 99 mg/dL Final   Glucose reference range applies only to samples taken after fasting for at least 8 hours.  . Glucose-Capillary 01/24/2022 275 (H)  70 - 99 mg/dL Final   Glucose reference range applies only to samples taken after fasting for at least 8 hours.  . Glucose-Capillary 01/24/2022 146 (H)  70 - 99 mg/dL Final   Glucose reference range applies only to samples taken after fasting for at least 8 hours.  . Glucose-Capillary 01/24/2022 137 (H)  70 - 99 mg/dL Final   Glucose reference range applies only to samples taken after fasting for at least 8 hours.  . Glucose-Capillary 01/25/2022 201 (H)  70 - 99 mg/dL Final   Glucose reference range applies only to samples taken after fasting for at least 8 hours.  . Glucose-Capillary 01/25/2022 195 (H)  70 - 99 mg/dL Final   Glucose reference range applies only to samples taken after fasting for at least 8 hours.  . Glucose-Capillary 01/25/2022 149 (H)  70 - 99 mg/dL Final   Glucose reference range applies only to samples taken after fasting for at least 8 hours.  . Glucose-Capillary 01/25/2022 147 (H)  70 - 99 mg/dL Final   Glucose reference range applies only to samples taken after fasting for at least 8 hours.  . Glucose-Capillary 01/26/2022 123 (H)  70 - 99 mg/dL Final   Glucose reference range applies only to samples taken after fasting for at least 8 hours.  . Glucose-Capillary 01/26/2022 176 (H)  70 - 99 mg/dL Final   Glucose reference range applies only to samples taken after fasting for at least 8 hours.  . Glucose-Capillary 01/26/2022 84  70 - 99 mg/dL Final   Glucose reference range applies only to samples taken after fasting for  at least 8 hours.  . Glucose-Capillary 01/26/2022 177 (H)  70 - 99 mg/dL Final   Glucose reference range applies only to samples taken after fasting for at least 8 hours.  . Glucose-Capillary 01/27/2022 150 (H)  70 - 99 mg/dL Final   Glucose reference range applies only to samples taken after fasting for at least 8 hours.  . Glucose-Capillary 01/27/2022 171 (H)  70 - 99 mg/dL Final   Glucose reference range applies only to samples taken after fasting for at least 8 hours.  . Glucose-Capillary 01/27/2022 159 (H)  70 - 99 mg/dL Final   Glucose reference range applies only to samples taken after fasting for at least 8 hours.  . Glucose-Capillary 01/27/2022 67 (L)  70 - 99 mg/dL Final   Glucose reference range applies only to samples taken after fasting for at least 8 hours.  . Glucose-Capillary 01/27/2022 107 (H)  70 - 99 mg/dL Final   Glucose reference range applies only to samples taken after fasting for at least 8  hours.  . Glucose-Capillary 01/27/2022 144 (H)  70 - 99 mg/dL Final   Glucose reference range applies only to samples taken after fasting for at least 8 hours.  . Glucose-Capillary 01/28/2022 175 (H)  70 - 99 mg/dL Final   Glucose reference range applies only to samples taken after fasting for at least 8 hours.  . Glucose-Capillary 01/28/2022 198 (H)  70 - 99 mg/dL Final   Glucose reference range applies only to samples taken after fasting for at least 8 hours.  . Glucose-Capillary 01/28/2022 186 (H)  70 - 99 mg/dL Final   Glucose reference range applies only to samples taken after fasting for at least 8 hours.  . Glucose-Capillary 01/28/2022 198 (H)  70 - 99 mg/dL Final   Glucose reference range applies only to samples taken after fasting for at least 8 hours.  . Glucose-Capillary 01/29/2022 111 (H)  70 - 99 mg/dL Final   Glucose reference range applies only to samples taken after fasting for at least 8 hours.  . Glucose-Capillary 01/29/2022 160 (H)  70 - 99 mg/dL Final   Glucose  reference range applies only to samples taken after fasting for at least 8 hours.  . Glucose-Capillary 01/29/2022 209 (H)  70 - 99 mg/dL Final   Glucose reference range applies only to samples taken after fasting for at least 8 hours.  . Glucose-Capillary 01/29/2022 155 (H)  70 - 99 mg/dL Final   Glucose reference range applies only to samples taken after fasting for at least 8 hours.  . Glucose-Capillary 01/30/2022 157 (H)  70 - 99 mg/dL Final   Glucose reference range applies only to samples taken after fasting for at least 8 hours.  . Glucose-Capillary 01/30/2022 168 (H)  70 - 99 mg/dL Final   Glucose reference range applies only to samples taken after fasting for at least 8 hours.  . Glucose-Capillary 01/30/2022 217 (H)  70 - 99 mg/dL Final   Glucose reference range applies only to samples taken after fasting for at least 8 hours.  . Glucose-Capillary 01/30/2022 246 (H)  70 - 99 mg/dL Final   Glucose reference range applies only to samples taken after fasting for at least 8 hours.  . Glucose-Capillary 01/30/2022 287 (H)  70 - 99 mg/dL Final   Glucose reference range applies only to samples taken after fasting for at least 8 hours.  . Glucose-Capillary 01/31/2022 138 (H)  70 - 99 mg/dL Final   Glucose reference range applies only to samples taken after fasting for at least 8 hours.  . Glucose-Capillary 01/31/2022 193 (H)  70 - 99 mg/dL Final   Glucose reference range applies only to samples taken after fasting for at least 8 hours.  . Glucose-Capillary 01/31/2022 274 (H)  70 - 99 mg/dL Final   Glucose reference range applies only to samples taken after fasting for at least 8 hours.  . Glucose-Capillary 01/31/2022 320 (H)  70 - 99 mg/dL Final   Glucose reference range applies only to samples taken after fasting for at least 8 hours.  . Glucose-Capillary 01/31/2022 160 (H)  70 - 99 mg/dL Final   Glucose reference range applies only to samples taken after fasting for at least 8 hours.  .  Glucose-Capillary 02/01/2022 184 (H)  70 - 99 mg/dL Final   Glucose reference range applies only to samples taken after fasting for at least 8 hours.  . Glucose-Capillary 02/01/2022 138 (H)  70 - 99 mg/dL Final   Glucose reference range  applies only to samples taken after fasting for at least 8 hours.  . Glucose-Capillary 02/01/2022 171 (H)  70 - 99 mg/dL Final   Glucose reference range applies only to samples taken after fasting for at least 8 hours.  . Glucose-Capillary 02/01/2022 124 (H)  70 - 99 mg/dL Final   Glucose reference range applies only to samples taken after fasting for at least 8 hours.  . Glucose-Capillary 02/02/2022 112 (H)  70 - 99 mg/dL Final   Glucose reference range applies only to samples taken after fasting for at least 8 hours.  . Glucose-Capillary 02/02/2022 125 (H)  70 - 99 mg/dL Final   Glucose reference range applies only to samples taken after fasting for at least 8 hours.  . Glucose-Capillary 02/02/2022 274 (H)  70 - 99 mg/dL Final   Glucose reference range applies only to samples taken after fasting for at least 8 hours.  . Glucose-Capillary 02/02/2022 239 (H)  70 - 99 mg/dL Final   Glucose reference range applies only to samples taken after fasting for at least 8 hours.  . Glucose-Capillary 02/02/2022 190 (H)  70 - 99 mg/dL Final   Glucose reference range applies only to samples taken after fasting for at least 8 hours.  . Glucose-Capillary 02/02/2022 183 (H)  70 - 99 mg/dL Final   Glucose reference range applies only to samples taken after fasting for at least 8 hours.  . Glucose-Capillary 02/03/2022 113 (H)  70 - 99 mg/dL Final   Glucose reference range applies only to samples taken after fasting for at least 8 hours.  . Glucose-Capillary 02/03/2022 158 (H)  70 - 99 mg/dL Final   Glucose reference range applies only to samples taken after fasting for at least 8 hours.  . Glucose-Capillary 02/03/2022 100 (H)  70 - 99 mg/dL Final   Glucose reference range  applies only to samples taken after fasting for at least 8 hours.  . Glucose-Capillary 02/03/2022 162 (H)  70 - 99 mg/dL Final   Glucose reference range applies only to samples taken after fasting for at least 8 hours.  . Glucose-Capillary 02/03/2022 114 (H)  70 - 99 mg/dL Final   Glucose reference range applies only to samples taken after fasting for at least 8 hours.  . Glucose-Capillary 02/04/2022 138 (H)  70 - 99 mg/dL Final   Glucose reference range applies only to samples taken after fasting for at least 8 hours.  . Glucose-Capillary 02/04/2022 157 (H)  70 - 99 mg/dL Final   Glucose reference range applies only to samples taken after fasting for at least 8 hours.  . Glucose-Capillary 02/04/2022 208 (H)  70 - 99 mg/dL Final   Glucose reference range applies only to samples taken after fasting for at least 8 hours.  . Glucose-Capillary 02/04/2022 212 (H)  70 - 99 mg/dL Final   Glucose reference range applies only to samples taken after fasting for at least 8 hours.  . Glucose-Capillary 02/05/2022 184 (H)  70 - 99 mg/dL Final   Glucose reference range applies only to samples taken after fasting for at least 8 hours.  . Glucose-Capillary 02/05/2022 192 (H)  70 - 99 mg/dL Final   Glucose reference range applies only to samples taken after fasting for at least 8 hours.  . Glucose-Capillary 02/05/2022 128 (H)  70 - 99 mg/dL Final   Glucose reference range applies only to samples taken after fasting for at least 8 hours.  . Glucose-Capillary 02/05/2022 165 (H)  70 - 99 mg/dL Final   Glucose reference range applies only to samples taken after fasting for at least 8 hours.  . Glucose-Capillary 02/06/2022 111 (H)  70 - 99 mg/dL Final   Glucose reference range applies only to samples taken after fasting for at least 8 hours.  . Glucose-Capillary 02/05/2022 262 (H)  70 - 99 mg/dL Final   Glucose reference range applies only to samples taken after fasting for at least 8 hours.  Admission on  01/20/2022, Discharged on 01/21/2022  Component Date Value Ref Range Status  . SARS Coronavirus 2 by RT PCR 01/20/2022 NEGATIVE  NEGATIVE Final   Comment: (NOTE) SARS-CoV-2 target nucleic acids are NOT DETECTED.  The SARS-CoV-2 RNA is generally detectable in upper respiratory specimens during the acute phase of infection. The lowest concentration of SARS-CoV-2 viral copies this assay can detect is 138 copies/mL. A negative result does not preclude SARS-Cov-2 infection and should not be used as the sole basis for treatment or other patient management decisions. A negative result may occur with  improper specimen collection/handling, submission of specimen other than nasopharyngeal swab, presence of viral mutation(s) within the areas targeted by this assay, and inadequate number of viral copies(<138 copies/mL). A negative result must be combined with clinical observations, patient history, and epidemiological information. The expected result is Negative.  Fact Sheet for Patients:  EntrepreneurPulse.com.au  Fact Sheet for Healthcare Providers:  IncredibleEmployment.be  This test is no                          t yet approved or cleared by the [place] FDA and  has been authorized for detection and/or diagnosis of SARS-CoV-2 by FDA under an Emergency Use Authorization (EUA). This EUA will remain  in effect (meaning this test can be used) for the duration of the COVID-19 declaration under Section 564(b)(1) of the Act, 21 U.S.C.section 360bbb-3(b)(1), unless the authorization is terminated  or revoked sooner.      . Influenza A by PCR 01/20/2022 NEGATIVE  NEGATIVE Final  . Influenza B by PCR 01/20/2022 NEGATIVE  NEGATIVE Final   Comment: (NOTE) The Xpert Xpress SARS-CoV-2/FLU/RSV plus assay is intended as an aid in the diagnosis of influenza from Nasopharyngeal swab specimens and should not be used as a sole basis for treatment. Nasal washings  and aspirates are unacceptable for Xpert Xpress SARS-CoV-2/FLU/RSV testing.  Fact Sheet for Patients: EntrepreneurPulse.com.au  Fact Sheet for Healthcare Providers: IncredibleEmployment.be  This test is not yet approved or cleared by the [place] FDA and has been authorized for detection and/or diagnosis of SARS-CoV-2 by FDA under an Emergency Use Authorization (EUA). This EUA will remain in effect (meaning this test can be used) for the duration of the COVID-19 declaration under Section 564(b)(1) of the Act, 21 U.S.C. section 360bbb-3(b)(1), unless the authorization is terminated or revoked.  Performed at [facility], [place] [address], [place], [place] [address]   . Sodium 01/20/2022 136  135 - 145 mmol/L Final  . Potassium 01/20/2022 4.0  3.5 - 5.1 mmol/L Final  . Chloride 01/20/2022 103  98 - 111 mmol/L Final  . CO2 01/20/2022 24  22 - 32 mmol/L Final  . Glucose, Bld 01/20/2022 360 (H)  70 - 99 mg/dL Final   Glucose reference range applies only to samples taken after fasting for at least 8 hours.  . BUN 01/20/2022 12  8 - 23 mg/dL Final  . Creatinine, Ser 01/20/2022 1.01  0.61 - 1.24 mg/dL Final  . Calcium 01/20/2022 9.4  8.9 - 10.3 mg/dL Final  . Total Protein 01/20/2022 7.3  6.5 - 8.1 g/dL Final  . Albumin 01/20/2022 3.4 (L)  3.5 - 5.0 g/dL Final  . AST 01/20/2022 14 (L)  15 - 41 U/L Final  . ALT 01/20/2022 10  0 - 44 U/L Final  . Alkaline Phosphatase 01/20/2022 101  38 - 126 U/L Final  . Total Bilirubin 01/20/2022 0.3  0.3 - 1.2 mg/dL Final  . GFR, Estimated 01/20/2022 >60  >60 mL/min Final   Comment: (NOTE) Calculated using the CKD-EPI Creatinine Equation (2021)   . Anion gap 01/20/2022 9  5 - 15 Final   Performed at [facility], [place] [address], [place], [place] [address]  . Alcohol, Ethyl (B) 01/20/2022 <10  <10 mg/dL Final   Comment: (NOTE) Lowest detectable limit for serum alcohol is 10 mg/dL.  For medical  purposes only. Performed at [facility], [place] [address] [place], [place] [address]   . Opiates 01/20/2022 NONE DETECTED  NONE DETECTED Final  . Cocaine 01/20/2022 POSITIVE (A)  NONE DETECTED Final  . Benzodiazepines 01/20/2022 NONE DETECTED  NONE DETECTED Final  . Amphetamines 01/20/2022 NONE DETECTED  NONE DETECTED Final  . Tetrahydrocannabinol 01/20/2022 NONE DETECTED  NONE DETECTED Final  . Barbiturates 01/20/2022 NONE DETECTED  NONE DETECTED Final   Comment: (NOTE) DRUG SCREEN FOR MEDICAL PURPOSES ONLY.  IF CONFIRMATION IS NEEDED FOR ANY PURPOSE, NOTIFY LAB WITHIN 5 DAYS.  LOWEST DETECTABLE LIMITS FOR URINE DRUG SCREEN Drug Class                     Cutoff (ng/mL) Amphetamine and metabolites    1000 Barbiturate and metabolites    200 Benzodiazepine                 200 Opiates and metabolites        300 Cocaine and metabolites        300 THC                            50 Performed at [facility], [place] [address] [place], [place] [address]   . WBC 01/20/2022 5.2  4.0 - 10.5 K/uL Final  . RBC 01/20/2022 4.73  4.22 - 5.81 MIL/uL Final  . Hemoglobin 01/20/2022 14.3  13.0 - 17.0 g/dL Final  . HCT 01/20/2022 43.1  39.0 - 52.0 % Final  . MCV 01/20/2022 91.1  80.0 - 100.0 fL Final  . MCH 01/20/2022 30.2  26.0 - 34.0 pg Final  . MCHC 01/20/2022 33.2  30.0 - 36.0 g/dL Final  . RDW 01/20/2022 14.1  11.5 - 15.5 % Final  . Platelets 01/20/2022 444 (H)  150 - 400 K/uL Final  . nRBC 01/20/2022 0.0  0.0 - 0.2 % Final  . Neutrophils Relative % 01/20/2022 61  % Final  . Neutro Abs 01/20/2022 3.2  1.7 - 7.7 K/uL Final  . Lymphocytes Relative 01/20/2022 29  % Final  . Lymphs Abs 01/20/2022 1.5  0.7 - 4.0 K/uL Final  . Monocytes Relative 01/20/2022 7  % Final  . Monocytes Absolute 01/20/2022 0.4  0.1 - 1.0 K/uL Final  . Eosinophils Relative 01/20/2022 2  % Final  . Eosinophils Absolute 01/20/2022 0.1  0.0 - 0.5 K/uL Final  . Basophils Relative 01/20/2022 1  % Final  .  Basophils  Absolute 01/20/2022 0.0  0.0 - 0.1 K/uL Final  . Immature Granulocytes 01/20/2022 0  % Final  . Abs Immature Granulocytes 01/20/2022 0.01  0.00 - 0.07 K/uL Final   Performed at [facility], [place] [address] [place], [place] [address]  . Salicylate Lvl 94/76/5465 <7.0 (L)  7.0 - 30.0 mg/dL Final   Performed at [place] [address] [place], [place] [address]  . Acetaminophen (Tylenol), Serum 01/20/2022 <10 (L)  10 - 30 ug/mL Final   Comment: (NOTE) Therapeutic concentrations vary significantly. A range of 10-30 ug/mL  may be an effective concentration for many patients. However, some  are best treated at concentrations outside of this range. Acetaminophen concentrations >150 ug/mL at 4 hours after ingestion  and >50 ug/mL at 12 hours after ingestion are often associated with  toxic reactions.  Performed at [facility], [place] [address], [place], [place] [address]   . Glucose-Capillary 01/20/2022 344 (H)  70 - 99 mg/dL Final   Glucose reference range applies only to samples taken after fasting for at least 8 hours.  . Comment 1 01/20/2022 Notify RN   Final  . Comment 2 01/20/2022 Document in Chart   Final  . Glucose-Capillary 01/20/2022 479 (H)  70 - 99 mg/dL Final   Glucose reference range applies only to samples taken after fasting for at least 8 hours.  . Glucose-Capillary 01/21/2022 243 (H)  70 - 99 mg/dL Final   Glucose reference range applies only to samples taken after fasting for at least 8 hours.  . Glucose-Capillary 01/21/2022 224 (H)  70 - 99 mg/dL Final   Glucose reference range applies only to samples taken after fasting for at least 8 hours.  . Glucose-Capillary 01/21/2022 282 (H)  70 - 99 mg/dL Final   Glucose reference range applies only to samples taken after fasting for at least 8 hours.  Admission on 01/18/2022, Discharged on 01/19/2022  Component Date Value Ref Range Status  . WBC 01/18/2022 5.3  4.0 - 10.5 K/uL Final  . RBC  01/18/2022 4.47  4.22 - 5.81 MIL/uL Final  . Hemoglobin 01/18/2022 13.7  13.0 - 17.0 g/dL Final  . HCT 01/18/2022 41.0  39.0 - 52.0 % Final  . MCV 01/18/2022 91.7  80.0 - 100.0 fL Final  . MCH 01/18/2022 30.6  26.0 - 34.0 pg Final  . MCHC 01/18/2022 33.4  30.0 - 36.0 g/dL Final  . RDW 01/18/2022 14.2  11.5 - 15.5 % Final  . Platelets 01/18/2022 445 (H)  150 - 400 K/uL Final  . nRBC 01/18/2022 0.0  0.0 - 0.2 % Final   Performed at [facility], [place] [address] [place], [place] [address]  . Sodium 01/18/2022 136  135 - 145 mmol/L Final  . Potassium 01/18/2022 4.3  3.5 - 5.1 mmol/L Final  . Chloride 01/18/2022 100  98 - 111 mmol/L Final  . CO2 01/18/2022 22  22 - 32 mmol/L Final  . Glucose, Bld 01/18/2022 499 (H)  70 - 99 mg/dL Final   Glucose reference range applies only to samples taken after fasting for at least 8 hours.  . BUN 01/18/2022 9  8 - 23 mg/dL Final  . Creatinine, Ser 01/18/2022 1.23  0.61 - 1.24 mg/dL Final  . Calcium 01/18/2022 9.1  8.9 - 10.3 mg/dL Final  . Total Protein 01/18/2022 6.6  6.5 - 8.1 g/dL Final  . Albumin 01/18/2022 3.2 (L)  3.5 - 5.0 g/dL  Final  . AST 01/18/2022 18  15 - 41 U/L Final  . ALT 01/18/2022 9  0 - 44 U/L Final  . Alkaline Phosphatase 01/18/2022 95  38 - 126 U/L Final  . Total Bilirubin 01/18/2022 0.3  0.3 - 1.2 mg/dL Final  . GFR, Estimated 01/18/2022 >60  >60 mL/min Final   Comment: (NOTE) Calculated using the CKD-EPI Creatinine Equation (2021)   . Anion gap 01/18/2022 14  5 - 15 Final   Performed at [facility], [place] [address], [place], [place] [address]  . Color, Urine 01/18/2022 YELLOW  YELLOW Final  . APPearance 01/18/2022 CLEAR  CLEAR Final  . Specific Gravity, Urine 01/18/2022 1.025  1.005 - 1.030 Final  . pH 01/18/2022 6.0  5.0 - 8.0 Final  . Glucose, UA 01/18/2022 >=500 (A)  NEGATIVE mg/dL Final  . Hgb urine dipstick 01/18/2022 NEGATIVE  NEGATIVE Final  . Bilirubin Urine 01/18/2022 NEGATIVE  NEGATIVE Final  . Ketones,  ur 01/18/2022 NEGATIVE  NEGATIVE mg/dL Final  . Protein, ur 01/18/2022 NEGATIVE  NEGATIVE mg/dL Final  . Nitrite 01/18/2022 NEGATIVE  NEGATIVE Final  . Chalmers Guest 01/18/2022 SMALL (A)  NEGATIVE Final  . RBC / HPF 01/18/2022 0-5  0 - 5 RBC/hpf Final  . WBC, UA 01/18/2022 21-50  0 - 5 WBC/hpf Final  . Bacteria, UA 01/18/2022 RARE (A)  NONE SEEN Final  . Squamous Epithelial / LPF 01/18/2022 0-5  0 - 5 Final  . Budding Yeast 01/18/2022 PRESENT   Final   Performed at [name], [place] [address], [place], [place] [address]  . Opiates 01/18/2022 NONE DETECTED  NONE DETECTED Final  . Cocaine 01/18/2022 POSITIVE (A)  NONE DETECTED Final  . Benzodiazepines 01/18/2022 NONE DETECTED  NONE DETECTED Final  . Amphetamines 01/18/2022 NONE DETECTED  NONE DETECTED Final  . Tetrahydrocannabinol 01/18/2022 NONE DETECTED  NONE DETECTED Final  . Barbiturates 01/18/2022 NONE DETECTED  NONE DETECTED Final   Comment: (NOTE) DRUG SCREEN FOR MEDICAL PURPOSES ONLY.  IF CONFIRMATION IS NEEDED FOR ANY PURPOSE, NOTIFY LAB WITHIN 5 DAYS.  LOWEST DETECTABLE LIMITS FOR URINE DRUG SCREEN Drug Class                     Cutoff (ng/mL) Amphetamine and metabolites    1000 Barbiturate and metabolites    200 Benzodiazepine                 200 Opiates and metabolites        300 Cocaine and metabolites        300 THC                            50 Performed at [facility], [place] [address], [place], [place] [address]   . Glucose-Capillary 01/18/2022 483 (H)  70 - 99 mg/dL Final   Glucose reference range applies only to samples taken after fasting for at least 8 hours.  . Glucose-Capillary 01/19/2022 586 (HH)  70 - 99 mg/dL Final   Glucose reference range applies only to samples taken after fasting for at least 8 hours.  . Glucose-Capillary 01/19/2022 456 (H)  70 - 99 mg/dL Final   Glucose reference range applies only to samples taken after fasting for at least 8 hours.  . Glucose-Capillary 01/19/2022 515  (HH)  70 - 99 mg/dL Final   Glucose reference range applies only to samples taken after fasting for  at least 8 hours.  . Comment 1 01/19/2022 Notify RN   Final  . Glucose-Capillary 01/19/2022 164 (H)  70 - 99 mg/dL Final   Glucose reference range applies only to samples taken after fasting for at least 8 hours.  Admission on 01/07/2022, Discharged on 01/07/2022  Component Date Value Ref Range Status  . Glucose-Capillary 01/07/2022 267 (H)  70 - 99 mg/dL Final   Glucose reference range applies only to samples taken after fasting for at least 8 hours.  Admission on 01/02/2022, Discharged on 01/02/2022  Component Date Value Ref Range Status  . WBC 01/02/2022 7.0  4.0 - 10.5 K/uL Final  . RBC 01/02/2022 4.54  4.22 - 5.81 MIL/uL Final  . Hemoglobin 01/02/2022 13.9  13.0 - 17.0 g/dL Final  . HCT 01/02/2022 41.4  39.0 - 52.0 % Final  . MCV 01/02/2022 91.2  80.0 - 100.0 fL Final  . MCH 01/02/2022 30.6  26.0 - 34.0 pg Final  . MCHC 01/02/2022 33.6  30.0 - 36.0 g/dL Final  . RDW 01/02/2022 14.1  11.5 - 15.5 % Final  . Platelets 01/02/2022 369  150 - 400 K/uL Final  . nRBC 01/02/2022 0.0  0.0 - 0.2 % Final  . Neutrophils Relative % 01/02/2022 59  % Final  . Neutro Abs 01/02/2022 4.1  1.7 - 7.7 K/uL Final  . Lymphocytes Relative 01/02/2022 28  % Final  . Lymphs Abs 01/02/2022 1.9  0.7 - 4.0 K/uL Final  . Monocytes Relative 01/02/2022 8  % Final  . Monocytes Absolute 01/02/2022 0.5  0.1 - 1.0 K/uL Final  . Eosinophils Relative 01/02/2022 4  % Final  . Eosinophils Absolute 01/02/2022 0.3  0.0 - 0.5 K/uL Final  . Basophils Relative 01/02/2022 1  % Final  . Basophils Absolute 01/02/2022 0.1  0.0 - 0.1 K/uL Final  . Immature Granulocytes 01/02/2022 0  % Final  . Abs Immature Granulocytes 01/02/2022 0.02  0.00 - 0.07 K/uL Final   Performed at [facility], [place] [address] [place], [place] [address]  . Sodium 01/02/2022 138  135 - 145 mmol/L Final  . Potassium 01/02/2022 4.1  3.5 - 5.1 mmol/L  Final  . Chloride 01/02/2022 104  98 - 111 mmol/L Final  . CO2 01/02/2022 25  22 - 32 mmol/L Final  . Glucose, Bld 01/02/2022 216 (H)  70 - 99 mg/dL Final   Glucose reference range applies only to samples taken after fasting for at least 8 hours.  . BUN 01/02/2022 11  8 - 23 mg/dL Final  . Creatinine, Ser 01/02/2022 0.98  0.61 - 1.24 mg/dL Final  . Calcium 01/02/2022 9.5  8.9 - 10.3 mg/dL Final  . Total Protein 01/02/2022 7.4  6.5 - 8.1 g/dL Final  . Albumin 01/02/2022 3.4 (L)  3.5 - 5.0 g/dL Final  . AST 01/02/2022 12 (L)  15 - 41 U/L Final  . ALT 01/02/2022 11  0 - 44 U/L Final  . Alkaline Phosphatase 01/02/2022 90  38 - 126 U/L Final  . Total Bilirubin 01/02/2022 0.9  0.3 - 1.2 mg/dL Final  . GFR, Estimated 01/02/2022 >60  >60 mL/min Final   Comment: (NOTE) Calculated using the CKD-EPI Creatinine Equation (2021)   . Anion gap 01/02/2022 9  5 - 15 Final   Performed at [facility], [place] [address] [place], [place] [address]  [facility] on 11/01/2021  Component Date Value Ref Range Status  . POC Glucose 11/27/2021 301 (  A)  70 - 99 mg/dl Final   1 hr after lunch, had not taken insulin  Admission on 10/13/2021, Discharged on 10/15/2021  Component Date Value Ref Range Status  . Glucose-Capillary 10/13/2021 388 (H)  70 - 99 mg/dL Final   Glucose reference range applies only to samples taken after fasting for at least 8 hours.  Marland Kitchen SARS Coronavirus 2 by RT PCR 10/13/2021 NEGATIVE  NEGATIVE Final   Comment: (NOTE) SARS-CoV-2 target nucleic acids are NOT DETECTED.  The SARS-CoV-2 RNA is generally detectable in upper respiratory specimens during the acute phase of infection. The lowest concentration of SARS-CoV-2 viral copies this assay can detect is 138 copies/mL. A negative result does not preclude SARS-Cov-2 infection and should not be used as the sole basis for treatment or other patient management decisions. A negative result may occur with  improper specimen  collection/handling, submission of specimen other than nasopharyngeal swab, presence of viral mutation(s) within the areas targeted by this assay, and inadequate number of viral copies(<138 copies/mL). A negative result must be combined with clinical observations, patient history, and epidemiological information. The expected result is Negative.  Fact Sheet for Patients:  EntrepreneurPulse.com.au  Fact Sheet for Healthcare Providers:  IncredibleEmployment.be  This test is no                          t yet approved or cleared by the [place] FDA and  has been authorized for detection and/or diagnosis of SARS-CoV-2 by FDA under an Emergency Use Authorization (EUA). This EUA will remain  in effect (meaning this test can be used) for the duration of the COVID-19 declaration under Section 564(b)(1) of the Act, 21 U.S.C.section 360bbb-3(b)(1), unless the authorization is terminated  or revoked sooner.      . Influenza A by PCR 10/13/2021 NEGATIVE  NEGATIVE Final  . Influenza B by PCR 10/13/2021 NEGATIVE  NEGATIVE Final   Comment: (NOTE) The Xpert Xpress SARS-CoV-2/FLU/RSV plus assay is intended as an aid in the diagnosis of influenza from Nasopharyngeal swab specimens and should not be used as a sole basis for treatment. Nasal washings and aspirates are unacceptable for Xpert Xpress SARS-CoV-2/FLU/RSV testing.  Fact Sheet for Patients: EntrepreneurPulse.com.au  Fact Sheet for Healthcare Providers: IncredibleEmployment.be  This test is not yet approved or cleared by the [place] FDA and has been authorized for detection and/or diagnosis of SARS-CoV-2 by FDA under an Emergency Use Authorization (EUA). This EUA will remain in effect (meaning this test can be used) for the duration of the COVID-19 declaration under Section 564(b)(1) of the Act, 21 U.S.C. section 360bbb-3(b)(1), unless the authorization  is terminated or revoked.  Performed at [facility], [place] [address] [place], [place] [address]   . WBC 10/13/2021 9.5  4.0 - 10.5 K/uL Final  . RBC 10/13/2021 4.23  4.22 - 5.81 MIL/uL Final  . Hemoglobin 10/13/2021 13.0  13.0 - 17.0 g/dL Final  . HCT 10/13/2021 40.4  39.0 - 52.0 % Final  . MCV 10/13/2021 95.5  80.0 - 100.0 fL Final  . MCH 10/13/2021 30.7  26.0 - 34.0 pg Final  . MCHC 10/13/2021 32.2  30.0 - 36.0 g/dL Final  . RDW 10/13/2021 13.4  11.5 - 15.5 % Final  . Platelets 10/13/2021 551 (H)  150 - 400 K/uL Final  . nRBC 10/13/2021 0.0  0.0 - 0.2 % Final  . Neutrophils Relative % 10/13/2021 68  % Final  . Neutro Abs  10/13/2021 6.5  1.7 - 7.7 K/uL Final  . Lymphocytes Relative 10/13/2021 18  % Final  . Lymphs Abs 10/13/2021 1.7  0.7 - 4.0 K/uL Final  . Monocytes Relative 10/13/2021 9  % Final  . Monocytes Absolute 10/13/2021 0.8  0.1 - 1.0 K/uL Final  . Eosinophils Relative 10/13/2021 3  % Final  . Eosinophils Absolute 10/13/2021 0.3  0.0 - 0.5 K/uL Final  . Basophils Relative 10/13/2021 1  % Final  . Basophils Absolute 10/13/2021 0.1  0.0 - 0.1 K/uL Final  . Immature Granulocytes 10/13/2021 1  % Final  . Abs Immature Granulocytes 10/13/2021 0.11 (H)  0.00 - 0.07 K/uL Final   Performed at [place] [address], [place], [place] [address]  . Sodium 10/13/2021 136  135 - 145 mmol/L Final  . Potassium 10/13/2021 4.3  3.5 - 5.1 mmol/L Final  . Chloride 10/13/2021 104  98 - 111 mmol/L Final  . CO2 10/13/2021 24  22 - 32 mmol/L Final  . Glucose, Bld 10/13/2021 296 (H)  70 - 99 mg/dL Final   Glucose reference range applies only to samples taken after fasting for at least 8 hours.  . BUN 10/13/2021 14  8 - 23 mg/dL Final  . Creatinine, Ser 10/13/2021 1.15  0.61 - 1.24 mg/dL Final  . Calcium 10/13/2021 9.0  8.9 - 10.3 mg/dL Final  . Total Protein 10/13/2021 7.5  6.5 - 8.1 g/dL Final  . Albumin 10/13/2021 3.0 (L)  3.5 - 5.0 g/dL Final  . AST 10/13/2021 13 (L)   15 - 41 U/L Final  . ALT 10/13/2021 10  0 - 44 U/L Final  . Alkaline Phosphatase 10/13/2021 84  38 - 126 U/L Final  . Total Bilirubin 10/13/2021 0.5  0.3 - 1.2 mg/dL Final  . GFR, Estimated 10/13/2021 >60  >60 mL/min Final   Comment: (NOTE) Calculated using the CKD-EPI Creatinine Equation (2021)   . Anion gap 10/13/2021 8  5 - 15 Final   Performed at [facility] [address], [place], [place] [address]  . Color, Urine 10/13/2021 YELLOW  YELLOW Final  . APPearance 10/13/2021 CLOUDY (A)  CLEAR Final  . Specific Gravity, Urine 10/13/2021 1.022  1.005 - 1.030 Final  . pH 10/13/2021 5.0  5.0 - 8.0 Final  . Glucose, UA 10/13/2021 >=500 (A)  NEGATIVE mg/dL Final  . Hgb urine dipstick 10/13/2021 LARGE (A)  NEGATIVE Final  . Bilirubin Urine 10/13/2021 NEGATIVE  NEGATIVE Final  . Ketones, ur 10/13/2021 NEGATIVE  NEGATIVE mg/dL Final  . Protein, ur 10/13/2021 30 (A)  NEGATIVE mg/dL Final  . Nitrite 10/13/2021 NEGATIVE  NEGATIVE Final  . Chalmers Guest 10/13/2021 LARGE (A)  NEGATIVE Final  . RBC / HPF 10/13/2021 >50 (H)  0 - 5 RBC/hpf Final  . WBC, UA 10/13/2021 >50 (H)  0 - 5 WBC/hpf Final  . Bacteria, UA 10/13/2021 RARE (A)  NONE SEEN Final  . Squamous Epithelial / LPF 10/13/2021 0-5  0 - 5 Final  . WBC Clumps 10/13/2021 PRESENT   Final  . Mucus 10/13/2021 PRESENT   Final  . Budding Yeast 10/13/2021 PRESENT   Final  . Non Squamous Epithelial 10/13/2021 0-5 (A)  NONE SEEN Final   Performed at [facility], [place] [address] [place], [place] [address]  . pH, Ven 10/13/2021 7.457 (H)  7.25 - 7.43 Final  . pCO2, Ven 10/13/2021 38.1 (L)  44 - 60 mmHg Final  . pO2, Ven  10/13/2021 174 (H)  32 - 45 mmHg Final  . Bicarbonate 10/13/2021 26.9  20.0 - 28.0 mmol/L Final  . TCO2 10/13/2021 28  22 - 32 mmol/L Final  . O2 Saturation 10/13/2021 100  % Final  . Acid-Base Excess 10/13/2021 3.0 (H)  0.0 - 2.0 mmol/L Final  . Sodium 10/13/2021 137  135 - 145 mmol/L Final  . Potassium 10/13/2021  4.3  3.5 - 5.1 mmol/L Final  . Calcium, Ion 10/13/2021 1.10 (L)  1.15 - 1.40 mmol/L Final  . HCT 10/13/2021 40.0  39.0 - 52.0 % Final  . Hemoglobin 10/13/2021 13.6  13.0 - 17.0 g/dL Final  . Sample type 10/13/2021 VENOUS   Final  . Opiates 10/13/2021 NONE DETECTED  NONE DETECTED Final  . Cocaine 10/13/2021 POSITIVE (A)  NONE DETECTED Final  . Benzodiazepines 10/13/2021 NONE DETECTED  NONE DETECTED Final  . Amphetamines 10/13/2021 NONE DETECTED  NONE DETECTED Final  . Tetrahydrocannabinol 10/13/2021 NONE DETECTED  NONE DETECTED Final  . Barbiturates 10/13/2021 NONE DETECTED  NONE DETECTED Final   Comment: (NOTE) DRUG SCREEN FOR MEDICAL PURPOSES ONLY.  IF CONFIRMATION IS NEEDED FOR ANY PURPOSE, NOTIFY LAB WITHIN 5 DAYS.  LOWEST DETECTABLE LIMITS FOR URINE DRUG SCREEN Drug Class                     Cutoff (ng/mL) Amphetamine and metabolites    1000 Barbiturate and metabolites    200 Benzodiazepine                 025 Tricyclics and metabolites     300 Opiates and metabolites        300 Cocaine and metabolites        300 THC                            50 Performed at [facility], [place] [address], [place], [place] [address]   . Adenovirus 10/13/2021 NOT DETECTED  NOT DETECTED Final  . Coronavirus 229E 10/13/2021 NOT DETECTED  NOT DETECTED Final   Comment: (NOTE) The Coronavirus on the Respiratory Panel, DOES NOT test for the novel  Coronavirus (2019 nCoV)   . Coronavirus HKU1 10/13/2021 NOT DETECTED  NOT DETECTED Final  . Coronavirus NL63 10/13/2021 NOT DETECTED  NOT DETECTED Final  . Coronavirus OC43 10/13/2021 NOT DETECTED  NOT DETECTED Final  . Metapneumovirus 10/13/2021 NOT DETECTED  NOT DETECTED Final  . Rhinovirus / Enterovirus 10/13/2021 NOT DETECTED  NOT DETECTED Final  . Influenza A 10/13/2021 NOT DETECTED  NOT DETECTED Final  . Influenza B 10/13/2021 NOT DETECTED  NOT DETECTED Final  . Parainfluenza Virus 1 10/13/2021 NOT DETECTED  NOT DETECTED Final  .  Parainfluenza Virus 2 10/13/2021 NOT DETECTED  NOT DETECTED Final  . Parainfluenza Virus 3 10/13/2021 NOT DETECTED  NOT DETECTED Final  . Parainfluenza Virus 4 10/13/2021 NOT DETECTED  NOT DETECTED Final  . Respiratory Syncytial Virus 10/13/2021 NOT DETECTED  NOT DETECTED Final  . Bordetella pertussis 10/13/2021 NOT DETECTED  NOT DETECTED Final  . Bordetella Parapertussis 10/13/2021 NOT DETECTED  NOT DETECTED Final  . Chlamydophila pneumoniae 10/13/2021 NOT DETECTED  NOT DETECTED Final  . Mycoplasma pneumoniae 10/13/2021 NOT DETECTED  NOT DETECTED Final   Performed at [facility] [address], [place], [place] [address]  . Procalcitonin 10/13/2021 <0.10  ng/mL Final   Comment:        Interpretation: PCT (Procalcitonin) <=  0.5 ng/mL: Systemic infection (sepsis) is not likely. Local bacterial infection is possible. (NOTE)       Sepsis PCT Algorithm           Lower Respiratory Tract                                      Infection PCT Algorithm    ----------------------------     ----------------------------         PCT < 0.25 ng/mL                PCT < 0.10 ng/mL          Strongly encourage             Strongly discourage   discontinuation of antibiotics    initiation of antibiotics    ----------------------------     -----------------------------       PCT 0.25 - 0.50 ng/mL            PCT 0.10 - 0.25 ng/mL               OR       >80% decrease in PCT            Discourage initiation of                                            antibiotics      Encourage discontinuation           of antibiotics    ----------------------------     -----------------------------         PCT >= 0.50 ng/mL              PCT 0.26 - 0.50 ng/mL               AND                                 <80% decrease in PCT             Encourage initiation of                                             antibiotics       Encourage continuation           of antibiotics    ----------------------------      -----------------------------        PCT >= 0.50 ng/mL                  PCT > 0.50 ng/mL               AND         increase in PCT                  Strongly encourage                                      initiation of antibiotics    Strongly encourage escalation  of antibiotics                                     -----------------------------                                           PCT <= 0.25 ng/mL                                                 OR                                        > 80% decrease in PCT                                      Discontinue / Do not initiate                                             antibiotics  Performed at [facility], [place] [address], [place], [place] [address]   . B Natriuretic Peptide 10/13/2021 23.6  0.0 - 100.0 pg/mL Final   Performed at [facility], [place] [address], [place], [place] [address]  . L. pneumophila Serogp 1 Ur Ag 10/13/2021 Negative  Negative Final   Comment: (NOTE) Presumptive negative for L. pneumophila serogroup 1 antigen in urine, suggesting no recent or current infection. Legionnaires' disease cannot be ruled out since other serogroups and species may also cause disease. Performed At: [facility] [place], [place] [id number] Rush Farmer MD [id number]   . Source of Sample 10/13/2021 URINE, RANDOM   Final   Performed at [facility], [place] [address], [place], [place] [address]  . Strep Pneumo Urinary Antigen 10/13/2021 NEGATIVE  NEGATIVE Final   Comment:        Infection due to S. pneumoniae cannot be absolutely ruled out since the antigen present may be below the detection limit of the test. Performed at [facility], [address] [place], [place] [address]   . Sodium 10/14/2021 138  135 - 145 mmol/L Final  . Potassium 10/14/2021 4.3  3.5 - 5.1 mmol/L Final  . Chloride 10/14/2021 106  98 - 111 mmol/L Final  . CO2 10/14/2021 25  22 - 32 mmol/L Final   . Glucose, Bld 10/14/2021 286 (H)  70 - 99 mg/dL Final   Glucose reference range applies only to samples taken after fasting for at least 8 hours.  . BUN 10/14/2021 14  8 - 23 mg/dL Final  . Creatinine, Ser 10/14/2021 0.98  0.61 - 1.24 mg/dL Final  . Calcium 10/14/2021 8.9  8.9 - 10.3 mg/dL Final  . GFR, Estimated 10/14/2021 >60  >60 mL/min Final   Comment: (NOTE) Calculated using the CKD-EPI Creatinine Equation (2021)   . Anion gap 10/14/2021 7  5 - 15 Final  Performed at [facility], [place] [address], [place], [place] [address]  . WBC 10/14/2021 9.6  4.0 - 10.5 K/uL Final  . RBC 10/14/2021 4.12 (L)  4.22 - 5.81 MIL/uL Final  . Hemoglobin 10/14/2021 12.6 (L)  13.0 - 17.0 g/dL Final  . HCT 10/14/2021 38.8 (L)  39.0 - 52.0 % Final  . MCV 10/14/2021 94.2  80.0 - 100.0 fL Final  . MCH 10/14/2021 30.6  26.0 - 34.0 pg Final  . MCHC 10/14/2021 32.5  30.0 - 36.0 g/dL Final  . RDW 10/14/2021 13.4  11.5 - 15.5 % Final  . Platelets 10/14/2021 556 (H)  150 - 400 K/uL Final  . nRBC 10/14/2021 0.0  0.0 - 0.2 % Final   Performed at [facility] [address] [place], [place] [address]  . Glucose-Capillary 10/13/2021 281 (H)  70 - 99 mg/dL Final   Glucose reference range applies only to samples taken after fasting for at least 8 hours.  . Glucose-Capillary 10/13/2021 399 (H)  70 - 99 mg/dL Final   Glucose reference range applies only to samples taken after fasting for at least 8 hours.  . Glucose-Capillary 10/14/2021 239 (H)  70 - 99 mg/dL Final   Glucose reference range applies only to samples taken after fasting for at least 8 hours.  . Glucose-Capillary 10/14/2021 345 (H)  70 - 99 mg/dL Final   Glucose reference range applies only to samples taken after fasting for at least 8 hours.  . Glucose-Capillary 10/14/2021 383 (H)  70 - 99 mg/dL Final   Glucose reference range applies only to samples taken after fasting for at least 8 hours.  . Glucose-Capillary 10/15/2021 194 (H)  70 -  99 mg/dL Final   Glucose reference range applies only to samples taken after fasting for at least 8 hours.  . Glucose-Capillary 10/15/2021 161 (H)  70 - 99 mg/dL Final   Glucose reference range applies only to samples taken after fasting for at least 8 hours.  . Glucose-Capillary 10/15/2021 312 (H)  70 - 99 mg/dL Final   Glucose reference range applies only to samples taken after fasting for at least 8 hours.  . Glucose-Capillary 10/14/2021 279 (H)  70 - 99 mg/dL Final   Glucose reference range applies only to samples taken after fasting for at least 8 hours.  Office Visit on 10/08/2021  Component Date Value Ref Range Status  . POC Glucose 10/08/2021 321 (A)  70 - 99 mg/dl Final  . HbA1c, POC (controlled diabetic ra* 10/08/2021 9.3 (A)  0.0 - 7.0 % Final  There may be more visits with results that are not included.    Allergies: Lisinopril  PTA Medications: No current facility-administered medications on file prior to encounter.   Current Outpatient Medications on File Prior to Encounter  Medication Sig Dispense Refill  . Accu-Chek FastClix Lancets MISC Use as directed 100 each 0  . Accu-Chek Softclix Lancets lancets Use up to 4 times daily as directed 100 each 0  . amLODipine (NORVASC) 10 MG tablet Take 1 tablet (10 mg total) by mouth daily. 30 tablet 0  . atorvastatin (LIPITOR) 40 MG tablet Take 1 tablet (40 mg total) by mouth daily. 30 tablet 0  . Blood Glucose Monitoring Suppl (ACCU-CHEK GUIDE) w/Device KIT USE AS DIRECTED TO CHECK BLOOD SUGARS UP TO TWICE PER DAY 1 kit 0  . DULoxetine (CYMBALTA) 60 MG capsule Take 1 capsule (60 mg total) by mouth daily. 30 capsule 0  .  Emollient (LUBRIDERM SERIOUSLY SENSITIVE) LOTN Apply 1 Application topically 2 (two) times daily. 473 mL 0  . famotidine (PEPCID) 10 MG tablet Take 1 tablet (10 mg total) by mouth 2 (two) times daily. 60 tablet 1  . feeding supplement, GLUCERNA SHAKE, (GLUCERNA SHAKE) LIQD Take 237 mLs by mouth 3 (three) times daily  between meals. 42660 mL 0  . gabapentin (NEURONTIN) 300 MG capsule Take 2 capsules (600 mg total) by mouth 3 (three) times daily. 180 capsule 0  . glipiZIDE (GLUCOTROL XL) 10 MG 24 hr tablet Take 1 tablet (10 mg total) by mouth daily with breakfast. 30 tablet 0  . glucose blood (ACCU-CHEK GUIDE) test strip Use up to 3 times daily as directed 100 each 3  . insulin aspart (NOVOLOG) 100 UNIT/ML injection Inject 3 Units into the skin 2 (two) times daily before lunch and supper. 10 mL 0  . Insulin Glargine Solostar (LANTUS) 100 UNIT/ML Solostar Pen Inject 24 Units into the skin daily. 15 mL 2  . Insulin Pen Needle 31G X 8 MM MISC Use with insulin pen 100 each 1  . melatonin 3 MG TABS tablet Take 1 tablet (3 mg total) by mouth at bedtime. 30 tablet 0  . metFORMIN (GLUCOPHAGE) 1000 MG tablet Take 1 tablet (1,000 mg total) by mouth 2 (two) times daily. 60 tablet 0  . nicotine (NICODERM CQ - DOSED IN MG/24 HOURS) 21 mg/24hr patch Place 1 patch (21 mg total) onto the skin daily at 6 (six) AM. 28 patch 0  . tamsulosin (FLOMAX) 0.4 MG CAPS capsule Take 2 capsules (0.8 mg total) by mouth daily. For renal calculi 60 capsule 0  . traZODone (DESYREL) 100 MG tablet Take 1 tablet (100 mg total) by mouth at bedtime as needed for sleep. 15 tablet 0  . [DISCONTINUED] loratadine (CLARITIN) 10 MG tablet Take 1 tablet (10 mg total) by mouth daily. (Patient not taking: No sig reported) 30 tablet 0     Long Term Goals: Improvement in symptoms so as ready for discharge  Short Term Goals: Patient will verbalize feelings in meetings with treatment team members., Patient will attend at least of 50% of the groups daily., Pt will complete the PHQ9 on admission, day 3 and discharge., and Patient will take medications as prescribed daily.  Medical Decision Making  ***    Recommendations  Based on my evaluation the patient does not appear to have an emergency medical condition.  Lucia Bitter, NP 03/03/22  12:28 AM

## 2022-03-03 NOTE — ED Notes (Signed)
Pt admitted to [facility] for substance use. Pt A&O x4, calm and cooperative. Pt denies SI/HI/AVH and states he just needs somewhere to stay for a few days until he can get into his own apartment. Pt tolerated admission process and skin assessment well. Pt ambulated independently to unit. Oriented to unit/staff. Sandwich and juice provided per pt request. No signs of acute distress noted. Monitoring for safety.

## 2022-03-04 DIAGNOSIS — F1420 Cocaine dependence, uncomplicated: Secondary | ICD-10-CM | POA: Diagnosis not present

## 2022-03-04 DIAGNOSIS — Z794 Long term (current) use of insulin: Secondary | ICD-10-CM | POA: Diagnosis not present

## 2022-03-04 DIAGNOSIS — F1910 Other psychoactive substance abuse, uncomplicated: Secondary | ICD-10-CM | POA: Diagnosis not present

## 2022-03-04 DIAGNOSIS — Z79899 Other long term (current) drug therapy: Secondary | ICD-10-CM | POA: Diagnosis not present

## 2022-03-04 LAB — URINE CULTURE: Culture: <10000 — AB

## 2022-03-04 LAB — GLUCOSE, CAPILLARY
Glucose-Capillary: 174 mg/dL — ABNORMAL HIGH (ref 70–99)
Glucose-Capillary: 176 mg/dL — ABNORMAL HIGH (ref 70–99)
Glucose-Capillary: 224 mg/dL — ABNORMAL HIGH (ref 70–99)

## 2022-03-04 MED ORDER — LOPERAMIDE HCL 2 MG PO CAPS: 2.0000 mg | ORAL_CAPSULE | ORAL | Status: DC | PRN

## 2022-03-04 MED ORDER — INSULIN ASPART 100 UNIT/ML IJ SOLN
0.0000 [IU] | Freq: Three times a day (TID) | INTRAMUSCULAR | Status: DC
  Administered 2022-03-04 (×2): 3 [IU] via SUBCUTANEOUS
  Administered 2022-03-04: 2 [IU] via SUBCUTANEOUS
  Administered 2022-03-05: 3 [IU] via SUBCUTANEOUS
  Administered 2022-03-05: 2 [IU] via SUBCUTANEOUS
  Administered 2022-03-05: 3 [IU] via SUBCUTANEOUS
  Administered 2022-03-06: 1 [IU] via SUBCUTANEOUS
  Administered 2022-03-06: 5 [IU] via SUBCUTANEOUS
  Administered 2022-03-06: 2 [IU] via SUBCUTANEOUS
  Administered 2022-03-07: 3 [IU] via SUBCUTANEOUS
  Administered 2022-03-07: 2 [IU] via SUBCUTANEOUS
  Administered 2022-03-08: 3 [IU] via SUBCUTANEOUS

## 2022-03-04 NOTE — ED Notes (Signed)
Pt currently sitting in day room.   Drinking Tea and eating a snack.  Was complaining about back pain and was given PRN medication.  Will continue to monitor for safety.

## 2022-03-04 NOTE — ED Notes (Signed)
Pt instructed to come to the window for medications.  Stated that he would and as of yet has not made the effort to get up out of bed.  Will continue to prompt and monitor

## 2022-03-04 NOTE — ED Notes (Signed)
Patient resting with no sxs of distress noted - will continue to monitor for safety

## 2022-03-04 NOTE — ED Notes (Signed)
Pt resting at this time. Staff will cont to monitor.

## 2022-03-04 NOTE — ED Notes (Signed)
Pt is in the bed sleeping. Respirations are even and unlabored. No acute distress noted. Will continue to monitor for safety. 

## 2022-03-04 NOTE — ED Notes (Signed)
Pt awake and alert  ate breakfast and given sliding scale medication.

## 2022-03-04 NOTE — ED Notes (Signed)
Patient to nurses station saying his sugar feels low - sugar checked and water given - no sxs of distress - will continue to monitor for safety

## 2022-03-04 NOTE — ED Provider Notes (Signed)
Behavioral Health Progress Note  Date and Time: 03/04/2022 10:55 AM Name: David Wyatt MRN:  [id number]  Subjective:   David Wyatt is a 61 yr old male who presented to [facility] on 12/22 requesting Detox from Crack Cocaine, he was admitted to [facility] on 12/24.  PPHx is significant for Substance Induced Mood Disorder, Cocaine Use Disorder, tobacco use disorder, and no Psychiatric Hospitalizations or Suicide Attempts.  PMHx Is significant for DM2 + neuropathy, HTN, HLD, and housing instability.   He reports that he has not doing too well this morning.  He reports that he had several episodes of diarrhea last night requiring Imodium.  He denies any other symptoms such as fever/chills, nausea, dizziness/weakness.  He reports no withdrawal symptoms.  He reports cravings.  Discussed that we would keep as needed Imodium ordered and that he should let staff know if he begins to develop other symptoms.  He also had many questions about what food to eat and how It can affect his blood sugar.  He reports no SI, HI, or AVH.  He reports his sleep was poor last night.  He reports his appetite is good.  He reports no other concerns at present.   Diagnosis:  Final diagnoses:  Substance abuse ([place])  Cocaine use disorder ([place])    Total Time spent with patient: 30 minutes  Past Psychiatric History: Substance Induced Mood Disorder, Cocaine Use Disorder, tobacco use disorder, and no Psychiatric Hospitalizations or Suicide Attempts.  Past Medical History:  Past Medical History:  Diagnosis Date   Angioedema of lips 07/28/2012   left upper (07/29/2012)   Arthritis    hands and back   Chronic back pain    Cocaine abuse (HCC)    Diabetic neuropathy (HCC)    High cholesterol    Hypertension    Neuropathy    Type II diabetes mellitus ([place])     Past Surgical History:  Procedure Laterality Date   BACK SURGERY     CYSTOSCOPY W/ URETERAL STENT PLACEMENT Right 02/23/2021   Procedure: CYSTOSCOPY WITH RETROGRADE  PYELOGRAM/URETERAL STENT PLACEMENT;  Surgeon: Janith Lima, MD;  Location: [facility];  Service: Urology;  Laterality: Right;   HERNIA REPAIR Right 04/01/2012   I & D EXTREMITY Left 12/13/2012   Procedure: IRRIGATION AND DEBRIDEMENT LEFT THUMB;  Surgeon: Tennis Must, MD;  Location: [place];  Service: Orthopedics;  Laterality: Left;   INCISION AND DRAINAGE OF WOUND     boil on back/notes 07/14/2008 (07/29/2012)   INGUINAL HERNIA REPAIR  04/01/2012   Procedure: HERNIA REPAIR INGUINAL ADULT;  Surgeon: Haywood Lasso, MD;  Location: [place];  Service: General;  Laterality: Right;   INGUINAL HERNIA REPAIR Left 09/06/2016   Procedure: OPEN REPAIR LEFT INGUINAL HERNIA;  Surgeon: Greer Pickerel, MD;  Location: [place];  Service: General;  Laterality: Left;   INSERTION OF MESH Left 09/06/2016   Procedure: INSERTION OF MESH;  Surgeon: Greer Pickerel, MD;  Location: [place];  Service: General;  Laterality: Left;   TONSILLECTOMY     Family History:  Family History  Problem Relation Age of Onset   Diabetes Mother    Kidney disease Mother    Hyperlipidemia Mother    Hyperlipidemia Father    Diabetes Father    Family Psychiatric  History: None Reported  Social History:  Social History   Substance and Sexual Activity  Alcohol Use No   Alcohol/week: 0.0 standard drinks of alcohol     Social History   Substance and  Sexual Activity  Drug Use Not Currently   Types: "Crack" cocaine    Social History   Socioeconomic History   Marital status: Divorced    Spouse name: Not on file   Number of children: 3   Years of education: 12   Highest education level: Not on file  Occupational History    Comment: disabled  Tobacco Use   Smoking status: Every Day    Packs/day: 0.25    Years: 30.00    Total pack years: 7.50    Types: Cigarettes   Smokeless tobacco: Former    Quit date: 08/15/2015   Tobacco comments:    04/29/16 2  cigs daily, 10/27/17 sometimes < .25 PPD  Vaping Use   Vaping Use: Never used   Substance and Sexual Activity   Alcohol use: No    Alcohol/week: 0.0 standard drinks of alcohol   Drug use: Not Currently    Types: "Crack" cocaine   Sexual activity: Not on file  Other Topics Concern   Not on file  Social History Narrative   04/29/17   Lives in shelter   Caffeine- a lot of  tea, coffee   Social Determinants of Health   Financial Resource Strain: Not on file  Food Insecurity: Not on file  Transportation Needs: Not on file  Physical Activity: Not on file  Stress: Not on file  Social Connections: Not on file   SDOH:  SDOH Screenings   Depression (PHQ2-9): Low Risk  (03/03/2022)  Recent Concern: Depression (PHQ2-9) - Medium Risk (03/03/2022)  Tobacco Use: High Risk (03/01/2022)   Additional Social History:    Pain Medications: See MAR Prescriptions: See MAR Over the Counter: See MAR History of alcohol / drug use?: Yes Longest period of sobriety (when/how long): Hx of crack cocaine use - clean 2-3 months now Negative Consequences of Use: Financial Withdrawal Symptoms: None Name of Substance 1: Cocaine (crack) 1 - Age of First Use: 20s 1 - Amount (size/oz): Varies 1 - Frequency: Daily when available 1 - Duration: Ongoing 1 - Last Use / Amount: 1 month ago 1 - Method of Aquiring: Unknown 1- Route of Use: Smoke inhalation                  Sleep: Poor  Appetite:  Good  Current Medications:  Current Facility-Administered Medications  Medication Dose Route Frequency Provider Last Rate Last Admin   acetaminophen (TYLENOL) tablet 650 mg  650 mg Oral Q6H PRN Bobbitt, Shalon E, NP       alum & mag hydroxide-simeth (MAALOX/MYLANTA) 200-200-20 MG/5ML suspension 30 mL  30 mL Oral Q4H PRN Bobbitt, Shalon E, NP       amLODipine (NORVASC) tablet 10 mg  10 mg Oral Daily Bobbitt, Shalon E, NP   10 mg at 03/04/22 1015   atorvastatin (LIPITOR) tablet 40 mg  40 mg Oral Daily Bobbitt, Shalon E, NP   40 mg at 03/04/22 1015   DULoxetine (CYMBALTA) DR capsule 60  mg  60 mg Oral Daily Bobbitt, Shalon E, NP   60 mg at 03/04/22 1015   famotidine (PEPCID) tablet 10 mg  10 mg Oral BID Bobbitt, Shalon E, NP   10 mg at 03/04/22 1015   gabapentin (NEURONTIN) capsule 600 mg  600 mg Oral TID Bobbitt, Shalon E, NP   600 mg at 03/04/22 1015   glipiZIDE (GLUCOTROL XL) 24 hr tablet 10 mg  10 mg Oral Q breakfast Bobbitt, Shalon E, NP   10 mg at  03/04/22 0819   hydrOXYzine (ATARAX) tablet 25 mg  25 mg Oral TID PRN Bobbitt, Shalon E, NP   25 mg at 03/03/22 2046   insulin aspart (novoLOG) injection 0-9 Units  0-9 Units Subcutaneous TID WC Briant Cedar, MD   2 Units at 03/04/22 9735   insulin aspart (novoLOG) injection 3 Units  3 Units Subcutaneous BID AC Bobbitt, Shalon E, NP   3 Units at 03/03/22 1710   insulin glargine-yfgn (SEMGLEE) injection 24 Units  24 Units Subcutaneous Daily Hampton Abbot, MD   24 Units at 03/04/22 1017   loperamide (IMODIUM) capsule 2-4 mg  2-4 mg Oral PRN Briant Cedar, MD       magnesium hydroxide (MILK OF MAGNESIA) suspension 30 mL  30 mL Oral Daily PRN Bobbitt, Shalon E, NP       metFORMIN (GLUCOPHAGE) tablet 1,000 mg  1,000 mg Oral BID WC Bobbitt, Shalon E, NP   1,000 mg at 03/04/22 0819   tamsulosin (FLOMAX) capsule 0.8 mg  0.8 mg Oral Daily Bobbitt, Shalon E, NP   0.8 mg at 03/04/22 1015   traZODone (DESYREL) tablet 100 mg  100 mg Oral QHS PRN Bobbitt, Shalon E, NP   100 mg at 03/03/22 2045   Current Outpatient Medications  Medication Sig Dispense Refill   glipiZIDE (GLUCOTROL) 5 MG tablet Take 10 mg by mouth every morning.     amLODipine (NORVASC) 10 MG tablet Take 1 tablet (10 mg total) by mouth daily. 30 tablet 0   atorvastatin (LIPITOR) 40 MG tablet Take 1 tablet (40 mg total) by mouth daily. 30 tablet 0   Blood Glucose Monitoring Suppl (ACCU-CHEK GUIDE) w/Device KIT USE AS DIRECTED TO CHECK BLOOD SUGARS UP TO TWICE PER DAY 1 kit 0   gabapentin (NEURONTIN) 300 MG capsule Take 2 capsules (600 mg total) by mouth 3  (three) times daily. 180 capsule 0   insulin aspart (NOVOLOG) 100 UNIT/ML injection Inject 3 Units into the skin 2 (two) times daily before lunch and supper. 10 mL 0   Insulin Glargine Solostar (LANTUS) 100 UNIT/ML Solostar Pen Inject 24 Units into the skin daily. 15 mL 2   metFORMIN (GLUCOPHAGE) 1000 MG tablet Take 1 tablet (1,000 mg total) by mouth 2 (two) times daily. 60 tablet 0   tamsulosin (FLOMAX) 0.4 MG CAPS capsule Take 2 capsules (0.8 mg total) by mouth daily. For renal calculi 60 capsule 0   traZODone (DESYREL) 100 MG tablet Take 1 tablet (100 mg total) by mouth at bedtime as needed for sleep. 15 tablet 0    Labs  Lab Results:  Admission on 03/02/2022  Component Date Value Ref Range Status   TSH 03/02/2022 0.735  0.350 - 4.500 uIU/mL Final   Comment: Performed by a 3rd Generation assay with a functional sensitivity of <=0.01 uIU/mL. Performed at [facility], [place] [address], [place], [place] [address]    Glucose-Capillary 03/03/2022 281 (H)  70 - 99 mg/dL Final   Glucose reference range applies only to samples taken after fasting for at least 8 hours.   Glucose-Capillary 03/03/2022 147 (H)  70 - 99 mg/dL Final   Glucose reference range applies only to samples taken after fasting for at least 8 hours.   Glucose-Capillary 03/03/2022 70  70 - 99 mg/dL Final   Glucose reference range applies only to samples taken after fasting for at least 8 hours.   Glucose-Capillary 03/03/2022 134 (H)  70 - 99 mg/dL Final   Glucose  reference range applies only to samples taken after fasting for at least 8 hours.   Glucose-Capillary 03/03/2022 135 (H)  70 - 99 mg/dL Final   Glucose reference range applies only to samples taken after fasting for at least 8 hours.   Glucose-Capillary 03/04/2022 174 (H)  70 - 99 mg/dL Final   Glucose reference range applies only to samples taken after fasting for at least 8 hours.   Glucose-Capillary 03/04/2022 176 (H)  70 - 99 mg/dL Final   Glucose reference  range applies only to samples taken after fasting for at least 8 hours.  Admission on 03/01/2022, Discharged on 03/02/2022  Component Date Value Ref Range Status   WBC 03/02/2022 6.5  4.0 - 10.5 K/uL Final   RBC 03/02/2022 4.21 (L)  4.22 - 5.81 MIL/uL Final   Hemoglobin 03/02/2022 12.8 (L)  13.0 - 17.0 g/dL Final   HCT 03/02/2022 40.2  39.0 - 52.0 % Final   MCV 03/02/2022 95.5  80.0 - 100.0 fL Final   MCH 03/02/2022 30.4  26.0 - 34.0 pg Final   MCHC 03/02/2022 31.8  30.0 - 36.0 g/dL Final   RDW 03/02/2022 14.2  11.5 - 15.5 % Final   Platelets 03/02/2022 372  150 - 400 K/uL Final   nRBC 03/02/2022 0.0  0.0 - 0.2 % Final   Neutrophils Relative % 03/02/2022 60  % Final   Neutro Abs 03/02/2022 4.0  1.7 - 7.7 K/uL Final   Lymphocytes Relative 03/02/2022 26  % Final   Lymphs Abs 03/02/2022 1.7  0.7 - 4.0 K/uL Final   Monocytes Relative 03/02/2022 8  % Final   Monocytes Absolute 03/02/2022 0.5  0.1 - 1.0 K/uL Final   Eosinophils Relative 03/02/2022 4  % Final   Eosinophils Absolute 03/02/2022 0.2  0.0 - 0.5 K/uL Final   Basophils Relative 03/02/2022 1  % Final   Basophils Absolute 03/02/2022 0.0  0.0 - 0.1 K/uL Final   Immature Granulocytes 03/02/2022 1  % Final   Abs Immature Granulocytes 03/02/2022 0.03  0.00 - 0.07 K/uL Final   Performed at [facility], [place] [address] [place], [place] [address]   Sodium 03/02/2022 130 (L)  135 - 145 mmol/L Final   Potassium 03/02/2022 4.4  3.5 - 5.1 mmol/L Final   Chloride 03/02/2022 96 (L)  98 - 111 mmol/L Final   CO2 03/02/2022 24  22 - 32 mmol/L Final   Glucose, Bld 03/02/2022 709 (HH)  70 - 99 mg/dL Final   Comment: CRITICAL RESULT CALLED TO, READ BACK BY AND VERIFIED WITH J. NEWTON RN 03/02/22 _0  BY J. WHITE Glucose reference range applies only to samples taken after fasting for at least 8 hours.    BUN 03/02/2022 16  8 - 23 mg/dL Final   Creatinine, Ser 03/02/2022 1.36 (H)  0.61 - 1.24 mg/dL Final   Calcium 03/02/2022 8.7 (L)  8.9 -  10.3 mg/dL Final   Total Protein 03/02/2022 6.5  6.5 - 8.1 g/dL Final   Albumin 03/02/2022 3.0 (L)  3.5 - 5.0 g/dL Final   AST 03/02/2022 35  15 - 41 U/L Final   ALT 03/02/2022 22  0 - 44 U/L Final   Alkaline Phosphatase 03/02/2022 92  38 - 126 U/L Final   Total Bilirubin 03/02/2022 0.2 (L)  0.3 - 1.2 mg/dL Final   GFR, Estimated 03/02/2022 59 (L)  >60 mL/min Final   Comment: (NOTE) Calculated using the CKD-EPI Creatinine Equation (2021)  Anion gap 03/02/2022 10  5 - 15 Final   Performed at [place] [address] [place], [place] [address]   Alcohol, Ethyl (B) 03/02/2022 <10  <10 mg/dL Final   Comment: (NOTE) Lowest detectable limit for serum alcohol is 10 mg/dL.  For medical purposes only. Performed at [facility], [place] [address] [place], [place] [address]    Opiates 03/02/2022 NONE DETECTED  NONE DETECTED Final   Cocaine 03/02/2022 POSITIVE (A)  NONE DETECTED Final   Benzodiazepines 03/02/2022 NONE DETECTED  NONE DETECTED Final   Amphetamines 03/02/2022 NONE DETECTED  NONE DETECTED Final   Tetrahydrocannabinol 03/02/2022 NONE DETECTED  NONE DETECTED Final   Barbiturates 03/02/2022 NONE DETECTED  NONE DETECTED Final   Comment: (NOTE) DRUG SCREEN FOR MEDICAL PURPOSES ONLY.  IF CONFIRMATION IS NEEDED FOR ANY PURPOSE, NOTIFY LAB WITHIN 5 DAYS.  LOWEST DETECTABLE LIMITS FOR URINE DRUG SCREEN Drug Class                     Cutoff (ng/mL) Amphetamine and metabolites    1000 Barbiturate and metabolites    200 Benzodiazepine                 200 Opiates and metabolites        300 Cocaine and metabolites        300 THC                            50 Performed at [facility], [place] [address], [place], [place] [address]    Color, Urine 03/02/2022 STRAW (A)  YELLOW Final   APPearance 03/02/2022 HAZY (A)  CLEAR Final   Specific Gravity, Urine 03/02/2022 1.023  1.005 - 1.030 Final   pH 03/02/2022 6.0  5.0 - 8.0 Final   Glucose, UA 03/02/2022 >=500 (A)   NEGATIVE mg/dL Final   Hgb urine dipstick 03/02/2022 NEGATIVE  NEGATIVE Final   Bilirubin Urine 03/02/2022 NEGATIVE  NEGATIVE Final   Ketones, ur 03/02/2022 NEGATIVE  NEGATIVE mg/dL Final   Protein, ur 03/02/2022 NEGATIVE  NEGATIVE mg/dL Final   Nitrite 03/02/2022 NEGATIVE  NEGATIVE Final   Leukocytes,Ua 03/02/2022 LARGE (A)  NEGATIVE Final   RBC / HPF 03/02/2022 0-5  0 - 5 RBC/hpf Final   WBC, UA 03/02/2022 >50 (H)  0 - 5 WBC/hpf Final   Bacteria, UA 03/02/2022 RARE (A)  NONE SEEN Final   Squamous Epithelial / LPF 03/02/2022 0-5  0 - 5 Final   WBC Clumps 03/02/2022 PRESENT   Final   Mucus 03/02/2022 PRESENT   Final   Budding Yeast 03/02/2022 PRESENT   Final   Performed at [facility], [place] [address] [place], [place] [address]   SARS Coronavirus 2 by RT PCR 03/02/2022 NEGATIVE  NEGATIVE Final   Comment: (NOTE) SARS-CoV-2 target nucleic acids are NOT DETECTED.  The SARS-CoV-2 RNA is generally detectable in upper respiratory specimens during the acute phase of infection. The lowest concentration of SARS-CoV-2 viral copies this assay can detect is 138 copies/mL. A negative result does not preclude SARS-Cov-2 infection and should not be used as the sole basis for treatment or other patient management decisions. A negative result may occur with  improper specimen collection/handling, submission of specimen other than nasopharyngeal swab, presence of viral mutation(s) within the areas targeted by this assay, and inadequate number of viral copies(<138 copies/mL). A negative result must be combined with  clinical observations, patient history, and epidemiological information. The expected result is Negative.  Fact Sheet for Patients:  EntrepreneurPulse.com.au  Fact Sheet for Healthcare Providers:  IncredibleEmployment.be  This test is no                          t yet approved or cleared by the [place] FDA and  has been authorized for  detection and/or diagnosis of SARS-CoV-2 by FDA under an Emergency Use Authorization (EUA). This EUA will remain  in effect (meaning this test can be used) for the duration of the COVID-19 declaration under Section 564(b)(1) of the Act, 21 U.S.C.section 360bbb-3(b)(1), unless the authorization is terminated  or revoked sooner.       Influenza A by PCR 03/02/2022 NEGATIVE  NEGATIVE Final   Influenza B by PCR 03/02/2022 NEGATIVE  NEGATIVE Final   Comment: (NOTE) The Xpert Xpress SARS-CoV-2/FLU/RSV plus assay is intended as an aid in the diagnosis of influenza from Nasopharyngeal swab specimens and should not be used as a sole basis for treatment. Nasal washings and aspirates are unacceptable for Xpert Xpress SARS-CoV-2/FLU/RSV testing.  Fact Sheet for Patients: EntrepreneurPulse.com.au  Fact Sheet for Healthcare Providers: IncredibleEmployment.be  This test is not yet approved or cleared by the [place] FDA and has been authorized for detection and/or diagnosis of SARS-CoV-2 by FDA under an Emergency Use Authorization (EUA). This EUA will remain in effect (meaning this test can be used) for the duration of the COVID-19 declaration under Section 564(b)(1) of the Act, 21 U.S.C. section 360bbb-3(b)(1), unless the authorization is terminated or revoked.     Resp Syncytial Virus by PCR 03/02/2022 NEGATIVE  NEGATIVE Final   Comment: (NOTE) Fact Sheet for Patients: EntrepreneurPulse.com.au  Fact Sheet for Healthcare Providers: IncredibleEmployment.be  This test is not yet approved or cleared by the [place] FDA and has been authorized for detection and/or diagnosis of SARS-CoV-2 by FDA under an Emergency Use Authorization (EUA). This EUA will remain in effect (meaning this test can be used) for the duration of the COVID-19 declaration under Section 564(b)(1) of the Act, 21 U.S.C. section  360bbb-3(b)(1), unless the authorization is terminated or revoked.  Performed at [facility], [place] [address], [place], [place] [address]    Glucose-Capillary 03/02/2022 >600 (HH)  70 - 99 mg/dL Final   Glucose reference range applies only to samples taken after fasting for at least 8 hours.   Comment 1 03/02/2022 Document in Chart   Final   Glucose-Capillary 03/02/2022 241 (H)  70 - 99 mg/dL Final   Glucose reference range applies only to samples taken after fasting for at least 8 hours.   Comment 1 03/02/2022 Document in Chart   Final   Glucose-Capillary 03/02/2022 390 (H)  70 - 99 mg/dL Final   Glucose reference range applies only to samples taken after fasting for at least 8 hours.  Admission on 03/01/2022, Discharged on 03/01/2022  Component Date Value Ref Range Status   Glucose-Capillary 03/01/2022 289 (H)  70 - 99 mg/dL Final   Glucose reference range applies only to samples taken after fasting for at least 8 hours.  Admission on 01/21/2022, Discharged on 02/06/2022  Component Date Value Ref Range Status   Hgb A1c MFr Bld 01/21/2022 10.4 (H)  4.8 - 5.6 % Final   Comment: (NOTE) Pre diabetes:          5.7%-6.4%  Diabetes:              >  6.4%  Glycemic control for   <7.0% adults with diabetes    Mean Plasma Glucose 01/21/2022 251.78  mg/dL Final   Performed at [place] [address], [place], [place] [address]   TSH 01/21/2022 0.248 (L)  0.350 - 4.500 uIU/mL Final   Comment: Performed by a 3rd Generation assay with a functional sensitivity of <=0.01 uIU/mL. Performed at [facility], [place] [address], [place], [place] [address]    Glucose-Capillary 01/21/2022 374 (H)  70 - 99 mg/dL Final   Glucose reference range applies only to samples taken after fasting for at least 8 hours.   Glucose-Capillary 01/21/2022 289 (H)  70 - 99 mg/dL Final   Glucose reference range applies only to samples taken after fasting for at least 8 hours.   Glucose-Capillary  01/22/2022 137 (H)  70 - 99 mg/dL Final   Glucose reference range applies only to samples taken after fasting for at least 8 hours.   Glucose-Capillary 01/22/2022 297 (H)  70 - 99 mg/dL Final   Glucose reference range applies only to samples taken after fasting for at least 8 hours.   T3, Free 01/23/2022 2.2  2.0 - 4.4 pg/mL Final   Comment: (NOTE) Performed At: [facility] [place], [place] [id number] Rush Farmer MD LY:[id number]    Glucose-Capillary 01/22/2022 94  70 - 99 mg/dL Final   Glucose reference range applies only to samples taken after fasting for at least 8 hours.   Free T4 01/23/2022 0.72  0.61 - 1.12 ng/dL Final   Comment: (NOTE) Biotin ingestion may interfere with free T4 tests. If the results are inconsistent with the TSH level, previous test results, or the clinical presentation, then consider biotin interference. If needed, order repeat testing after stopping biotin. Performed at [facility], [place] [address] [place], [place] [address]    TSH 01/23/2022 0.203 (L)  0.350 - 4.500 uIU/mL Final   Comment: Performed by a 3rd Generation assay with a functional sensitivity of <=0.01 uIU/mL. Performed at [facility], [place] [address] [place], [place] [address]    Glucose-Capillary 01/23/2022 132 (H)  70 - 99 mg/dL Final   Glucose reference range applies only to samples taken after fasting for at least 8 hours.   Glucose-Capillary 01/22/2022 59 (L)  70 - 99 mg/dL Final   Glucose reference range applies only to samples taken after fasting for at least 8 hours.   Glucose-Capillary 01/22/2022 177 (H)  70 - 99 mg/dL Final   Glucose reference range applies only to samples taken after fasting for at least 8 hours.   Glucose-Capillary 01/23/2022 284 (H)  70 - 99 mg/dL Final   Glucose reference range applies only to samples taken after fasting for at least 8 hours.   Glucose-Capillary 01/23/2022 188 (H)  70 - 99 mg/dL Final   Glucose reference  range applies only to samples taken after fasting for at least 8 hours.   Glucose-Capillary 01/23/2022 294 (H)  70 - 99 mg/dL Final   Glucose reference range applies only to samples taken after fasting for at least 8 hours.   Glucose-Capillary 01/24/2022 180 (H)  70 - 99 mg/dL Final   Glucose reference range applies only to samples taken after fasting for at least 8 hours.   Glucose-Capillary 01/24/2022 275 (H)  70 - 99 mg/dL Final   Glucose reference range applies only to samples taken after fasting for at least 8 hours.   Glucose-Capillary  01/24/2022 146 (H)  70 - 99 mg/dL Final   Glucose reference range applies only to samples taken after fasting for at least 8 hours.   Glucose-Capillary 01/24/2022 137 (H)  70 - 99 mg/dL Final   Glucose reference range applies only to samples taken after fasting for at least 8 hours.   Glucose-Capillary 01/25/2022 201 (H)  70 - 99 mg/dL Final   Glucose reference range applies only to samples taken after fasting for at least 8 hours.   Glucose-Capillary 01/25/2022 195 (H)  70 - 99 mg/dL Final   Glucose reference range applies only to samples taken after fasting for at least 8 hours.   Glucose-Capillary 01/25/2022 149 (H)  70 - 99 mg/dL Final   Glucose reference range applies only to samples taken after fasting for at least 8 hours.   Glucose-Capillary 01/25/2022 147 (H)  70 - 99 mg/dL Final   Glucose reference range applies only to samples taken after fasting for at least 8 hours.   Glucose-Capillary 01/26/2022 123 (H)  70 - 99 mg/dL Final   Glucose reference range applies only to samples taken after fasting for at least 8 hours.   Glucose-Capillary 01/26/2022 176 (H)  70 - 99 mg/dL Final   Glucose reference range applies only to samples taken after fasting for at least 8 hours.   Glucose-Capillary 01/26/2022 84  70 - 99 mg/dL Final   Glucose reference range applies only to samples taken after fasting for at least 8 hours.   Glucose-Capillary 01/26/2022  177 (H)  70 - 99 mg/dL Final   Glucose reference range applies only to samples taken after fasting for at least 8 hours.   Glucose-Capillary 01/27/2022 150 (H)  70 - 99 mg/dL Final   Glucose reference range applies only to samples taken after fasting for at least 8 hours.   Glucose-Capillary 01/27/2022 171 (H)  70 - 99 mg/dL Final   Glucose reference range applies only to samples taken after fasting for at least 8 hours.   Glucose-Capillary 01/27/2022 159 (H)  70 - 99 mg/dL Final   Glucose reference range applies only to samples taken after fasting for at least 8 hours.   Glucose-Capillary 01/27/2022 67 (L)  70 - 99 mg/dL Final   Glucose reference range applies only to samples taken after fasting for at least 8 hours.   Glucose-Capillary 01/27/2022 107 (H)  70 - 99 mg/dL Final   Glucose reference range applies only to samples taken after fasting for at least 8 hours.   Glucose-Capillary 01/27/2022 144 (H)  70 - 99 mg/dL Final   Glucose reference range applies only to samples taken after fasting for at least 8 hours.   Glucose-Capillary 01/28/2022 175 (H)  70 - 99 mg/dL Final   Glucose reference range applies only to samples taken after fasting for at least 8 hours.   Glucose-Capillary 01/28/2022 198 (H)  70 - 99 mg/dL Final   Glucose reference range applies only to samples taken after fasting for at least 8 hours.   Glucose-Capillary 01/28/2022 186 (H)  70 - 99 mg/dL Final   Glucose reference range applies only to samples taken after fasting for at least 8 hours.   Glucose-Capillary 01/28/2022 198 (H)  70 - 99 mg/dL Final   Glucose reference range applies only to samples taken after fasting for at least 8 hours.   Glucose-Capillary 01/29/2022 111 (H)  70 - 99 mg/dL Final   Glucose reference range applies only to samples taken after  fasting for at least 8 hours.   Glucose-Capillary 01/29/2022 160 (H)  70 - 99 mg/dL Final   Glucose reference range applies only to samples taken after fasting  for at least 8 hours.   Glucose-Capillary 01/29/2022 209 (H)  70 - 99 mg/dL Final   Glucose reference range applies only to samples taken after fasting for at least 8 hours.   Glucose-Capillary 01/29/2022 155 (H)  70 - 99 mg/dL Final   Glucose reference range applies only to samples taken after fasting for at least 8 hours.   Glucose-Capillary 01/30/2022 157 (H)  70 - 99 mg/dL Final   Glucose reference range applies only to samples taken after fasting for at least 8 hours.   Glucose-Capillary 01/30/2022 168 (H)  70 - 99 mg/dL Final   Glucose reference range applies only to samples taken after fasting for at least 8 hours.   Glucose-Capillary 01/30/2022 217 (H)  70 - 99 mg/dL Final   Glucose reference range applies only to samples taken after fasting for at least 8 hours.   Glucose-Capillary 01/30/2022 246 (H)  70 - 99 mg/dL Final   Glucose reference range applies only to samples taken after fasting for at least 8 hours.   Glucose-Capillary 01/30/2022 287 (H)  70 - 99 mg/dL Final   Glucose reference range applies only to samples taken after fasting for at least 8 hours.   Glucose-Capillary 01/31/2022 138 (H)  70 - 99 mg/dL Final   Glucose reference range applies only to samples taken after fasting for at least 8 hours.   Glucose-Capillary 01/31/2022 193 (H)  70 - 99 mg/dL Final   Glucose reference range applies only to samples taken after fasting for at least 8 hours.   Glucose-Capillary 01/31/2022 274 (H)  70 - 99 mg/dL Final   Glucose reference range applies only to samples taken after fasting for at least 8 hours.   Glucose-Capillary 01/31/2022 320 (H)  70 - 99 mg/dL Final   Glucose reference range applies only to samples taken after fasting for at least 8 hours.   Glucose-Capillary 01/31/2022 160 (H)  70 - 99 mg/dL Final   Glucose reference range applies only to samples taken after fasting for at least 8 hours.   Glucose-Capillary 02/01/2022 184 (H)  70 - 99 mg/dL Final   Glucose  reference range applies only to samples taken after fasting for at least 8 hours.   Glucose-Capillary 02/01/2022 138 (H)  70 - 99 mg/dL Final   Glucose reference range applies only to samples taken after fasting for at least 8 hours.   Glucose-Capillary 02/01/2022 171 (H)  70 - 99 mg/dL Final   Glucose reference range applies only to samples taken after fasting for at least 8 hours.   Glucose-Capillary 02/01/2022 124 (H)  70 - 99 mg/dL Final   Glucose reference range applies only to samples taken after fasting for at least 8 hours.   Glucose-Capillary 02/02/2022 112 (H)  70 - 99 mg/dL Final   Glucose reference range applies only to samples taken after fasting for at least 8 hours.   Glucose-Capillary 02/02/2022 125 (H)  70 - 99 mg/dL Final   Glucose reference range applies only to samples taken after fasting for at least 8 hours.   Glucose-Capillary 02/02/2022 274 (H)  70 - 99 mg/dL Final   Glucose reference range applies only to samples taken after fasting for at least 8 hours.   Glucose-Capillary 02/02/2022 239 (H)  70 - 99 mg/dL Final  Glucose reference range applies only to samples taken after fasting for at least 8 hours.   Glucose-Capillary 02/02/2022 190 (H)  70 - 99 mg/dL Final   Glucose reference range applies only to samples taken after fasting for at least 8 hours.   Glucose-Capillary 02/02/2022 183 (H)  70 - 99 mg/dL Final   Glucose reference range applies only to samples taken after fasting for at least 8 hours.   Glucose-Capillary 02/03/2022 113 (H)  70 - 99 mg/dL Final   Glucose reference range applies only to samples taken after fasting for at least 8 hours.   Glucose-Capillary 02/03/2022 158 (H)  70 - 99 mg/dL Final   Glucose reference range applies only to samples taken after fasting for at least 8 hours.   Glucose-Capillary 02/03/2022 100 (H)  70 - 99 mg/dL Final   Glucose reference range applies only to samples taken after fasting for at least 8 hours.    Glucose-Capillary 02/03/2022 162 (H)  70 - 99 mg/dL Final   Glucose reference range applies only to samples taken after fasting for at least 8 hours.   Glucose-Capillary 02/03/2022 114 (H)  70 - 99 mg/dL Final   Glucose reference range applies only to samples taken after fasting for at least 8 hours.   Glucose-Capillary 02/04/2022 138 (H)  70 - 99 mg/dL Final   Glucose reference range applies only to samples taken after fasting for at least 8 hours.   Glucose-Capillary 02/04/2022 157 (H)  70 - 99 mg/dL Final   Glucose reference range applies only to samples taken after fasting for at least 8 hours.   Glucose-Capillary 02/04/2022 208 (H)  70 - 99 mg/dL Final   Glucose reference range applies only to samples taken after fasting for at least 8 hours.   Glucose-Capillary 02/04/2022 212 (H)  70 - 99 mg/dL Final   Glucose reference range applies only to samples taken after fasting for at least 8 hours.   Glucose-Capillary 02/05/2022 184 (H)  70 - 99 mg/dL Final   Glucose reference range applies only to samples taken after fasting for at least 8 hours.   Glucose-Capillary 02/05/2022 192 (H)  70 - 99 mg/dL Final   Glucose reference range applies only to samples taken after fasting for at least 8 hours.   Glucose-Capillary 02/05/2022 128 (H)  70 - 99 mg/dL Final   Glucose reference range applies only to samples taken after fasting for at least 8 hours.   Glucose-Capillary 02/05/2022 165 (H)  70 - 99 mg/dL Final   Glucose reference range applies only to samples taken after fasting for at least 8 hours.   Glucose-Capillary 02/06/2022 111 (H)  70 - 99 mg/dL Final   Glucose reference range applies only to samples taken after fasting for at least 8 hours.   Glucose-Capillary 02/05/2022 262 (H)  70 - 99 mg/dL Final   Glucose reference range applies only to samples taken after fasting for at least 8 hours.  Admission on 01/20/2022, Discharged on 01/21/2022  Component Date Value Ref Range Status   SARS  Coronavirus 2 by RT PCR 01/20/2022 NEGATIVE  NEGATIVE Final   Comment: (NOTE) SARS-CoV-2 target nucleic acids are NOT DETECTED.  The SARS-CoV-2 RNA is generally detectable in upper respiratory specimens during the acute phase of infection. The lowest concentration of SARS-CoV-2 viral copies this assay can detect is 138 copies/mL. A negative result does not preclude SARS-Cov-2 infection and should not be used as the sole basis for treatment or other  patient management decisions. A negative result may occur with  improper specimen collection/handling, submission of specimen other than nasopharyngeal swab, presence of viral mutation(s) within the areas targeted by this assay, and inadequate number of viral copies(<138 copies/mL). A negative result must be combined with clinical observations, patient history, and epidemiological information. The expected result is Negative.  Fact Sheet for Patients:  EntrepreneurPulse.com.au  Fact Sheet for Healthcare Providers:  IncredibleEmployment.be  This test is no                          t yet approved or cleared by the [place] FDA and  has been authorized for detection and/or diagnosis of SARS-CoV-2 by FDA under an Emergency Use Authorization (EUA). This EUA will remain  in effect (meaning this test can be used) for the duration of the COVID-19 declaration under Section 564(b)(1) of the Act, 21 U.S.C.section 360bbb-3(b)(1), unless the authorization is terminated  or revoked sooner.       Influenza A by PCR 01/20/2022 NEGATIVE  NEGATIVE Final   Influenza B by PCR 01/20/2022 NEGATIVE  NEGATIVE Final   Comment: (NOTE) The Xpert Xpress SARS-CoV-2/FLU/RSV plus assay is intended as an aid in the diagnosis of influenza from Nasopharyngeal swab specimens and should not be used as a sole basis for treatment. Nasal washings and aspirates are unacceptable for Xpert Xpress SARS-CoV-2/FLU/RSV testing.  Fact  Sheet for Patients: EntrepreneurPulse.com.au  Fact Sheet for Healthcare Providers: IncredibleEmployment.be  This test is not yet approved or cleared by the [place] FDA and has been authorized for detection and/or diagnosis of SARS-CoV-2 by FDA under an Emergency Use Authorization (EUA). This EUA will remain in effect (meaning this test can be used) for the duration of the COVID-19 declaration under Section 564(b)(1) of the Act, 21 U.S.C. section 360bbb-3(b)(1), unless the authorization is terminated or revoked.  Performed at [facility], [place] [address] [place], [place] [address]    Sodium 01/20/2022 136  135 - 145 mmol/L Final   Potassium 01/20/2022 4.0  3.5 - 5.1 mmol/L Final   Chloride 01/20/2022 103  98 - 111 mmol/L Final   CO2 01/20/2022 24  22 - 32 mmol/L Final   Glucose, Bld 01/20/2022 360 (H)  70 - 99 mg/dL Final   Glucose reference range applies only to samples taken after fasting for at least 8 hours.   BUN 01/20/2022 12  8 - 23 mg/dL Final   Creatinine, Ser 01/20/2022 1.01  0.61 - 1.24 mg/dL Final   Calcium 01/20/2022 9.4  8.9 - 10.3 mg/dL Final   Total Protein 01/20/2022 7.3  6.5 - 8.1 g/dL Final   Albumin 01/20/2022 3.4 (L)  3.5 - 5.0 g/dL Final   AST 01/20/2022 14 (L)  15 - 41 U/L Final   ALT 01/20/2022 10  0 - 44 U/L Final   Alkaline Phosphatase 01/20/2022 101  38 - 126 U/L Final   Total Bilirubin 01/20/2022 0.3  0.3 - 1.2 mg/dL Final   GFR, Estimated 01/20/2022 >60  >60 mL/min Final   Comment: (NOTE) Calculated using the CKD-EPI Creatinine Equation (2021)    Anion gap 01/20/2022 9  5 - 15 Final   Performed at [facility] [address], [place], [place] [address]   Alcohol, Ethyl (B) 01/20/2022 <10  <10 mg/dL Final   Comment: (NOTE) Lowest detectable limit for serum alcohol is 10 mg/dL.  For medical purposes only. Performed at [facility],  [address], [place], [place] [address]    Opiates  01/20/2022 NONE DETECTED  NONE DETECTED Final   Cocaine 01/20/2022 POSITIVE (A)  NONE DETECTED Final   Benzodiazepines 01/20/2022 NONE DETECTED  NONE DETECTED Final   Amphetamines 01/20/2022 NONE DETECTED  NONE DETECTED Final   Tetrahydrocannabinol 01/20/2022 NONE DETECTED  NONE DETECTED Final   Barbiturates 01/20/2022 NONE DETECTED  NONE DETECTED Final   Comment: (NOTE) DRUG SCREEN FOR MEDICAL PURPOSES ONLY.  IF CONFIRMATION IS NEEDED FOR ANY PURPOSE, NOTIFY LAB WITHIN 5 DAYS.  LOWEST DETECTABLE LIMITS FOR URINE DRUG SCREEN Drug Class                     Cutoff (ng/mL) Amphetamine and metabolites    1000 Barbiturate and metabolites    200 Benzodiazepine                 200 Opiates and metabolites        300 Cocaine and metabolites        300 THC                            50 Performed at [facility], [place] [address], [place], [place] [address]    WBC 01/20/2022 5.2  4.0 - 10.5 K/uL Final   RBC 01/20/2022 4.73  4.22 - 5.81 MIL/uL Final   Hemoglobin 01/20/2022 14.3  13.0 - 17.0 g/dL Final   HCT 01/20/2022 43.1  39.0 - 52.0 % Final   MCV 01/20/2022 91.1  80.0 - 100.0 fL Final   MCH 01/20/2022 30.2  26.0 - 34.0 pg Final   MCHC 01/20/2022 33.2  30.0 - 36.0 g/dL Final   RDW 01/20/2022 14.1  11.5 - 15.5 % Final   Platelets 01/20/2022 444 (H)  150 - 400 K/uL Final   nRBC 01/20/2022 0.0  0.0 - 0.2 % Final   Neutrophils Relative % 01/20/2022 61  % Final   Neutro Abs 01/20/2022 3.2  1.7 - 7.7 K/uL Final   Lymphocytes Relative 01/20/2022 29  % Final   Lymphs Abs 01/20/2022 1.5  0.7 - 4.0 K/uL Final   Monocytes Relative 01/20/2022 7  % Final   Monocytes Absolute 01/20/2022 0.4  0.1 - 1.0 K/uL Final   Eosinophils Relative 01/20/2022 2  % Final   Eosinophils Absolute 01/20/2022 0.1  0.0 - 0.5 K/uL Final   Basophils Relative 01/20/2022 1  % Final   Basophils Absolute 01/20/2022 0.0  0.0 - 0.1 K/uL Final   Immature Granulocytes 01/20/2022 0  % Final   Abs Immature Granulocytes  01/20/2022 0.01  0.00 - 0.07 K/uL Final   Performed at [facility], [place] [address], [place], [place] [address]   Salicylate Lvl 44/96/7591 <7.0 (L)  7.0 - 30.0 mg/dL Final   Performed at [facility] [address], [place], [place] [address]   Acetaminophen (Tylenol), Serum 01/20/2022 <10 (L)  10 - 30 ug/mL Final   Comment: (NOTE) Therapeutic concentrations vary significantly. A range of 10-30 ug/mL  may be an effective concentration for many patients. However, some  are best treated at concentrations outside of this range. Acetaminophen concentrations >150 ug/mL at 4 hours after ingestion  and >50 ug/mL at 12 hours after ingestion are often associated with  toxic reactions.  Performed at [facility], [place] [address] [place], [place] [address]    Glucose-Capillary 01/20/2022 344 (H)  70 -  99 mg/dL Final   Glucose reference range applies only to samples taken after fasting for at least 8 hours.   Comment 1 01/20/2022 Notify RN   Final   Comment 2 01/20/2022 Document in Chart   Final   Glucose-Capillary 01/20/2022 479 (H)  70 - 99 mg/dL Final   Glucose reference range applies only to samples taken after fasting for at least 8 hours.   Glucose-Capillary 01/21/2022 243 (H)  70 - 99 mg/dL Final   Glucose reference range applies only to samples taken after fasting for at least 8 hours.   Glucose-Capillary 01/21/2022 224 (H)  70 - 99 mg/dL Final   Glucose reference range applies only to samples taken after fasting for at least 8 hours.   Glucose-Capillary 01/21/2022 282 (H)  70 - 99 mg/dL Final   Glucose reference range applies only to samples taken after fasting for at least 8 hours.  Admission on 01/18/2022, Discharged on 01/19/2022  Component Date Value Ref Range Status   WBC 01/18/2022 5.3  4.0 - 10.5 K/uL Final   RBC 01/18/2022 4.47  4.22 - 5.81 MIL/uL Final   Hemoglobin 01/18/2022 13.7  13.0 - 17.0 g/dL Final   HCT 01/18/2022 41.0  39.0 - 52.0 % Final   MCV  01/18/2022 91.7  80.0 - 100.0 fL Final   MCH 01/18/2022 30.6  26.0 - 34.0 pg Final   MCHC 01/18/2022 33.4  30.0 - 36.0 g/dL Final   RDW 01/18/2022 14.2  11.5 - 15.5 % Final   Platelets 01/18/2022 445 (H)  150 - 400 K/uL Final   nRBC 01/18/2022 0.0  0.0 - 0.2 % Final   Performed at [facility], [place] [address], [place], [place] [address]   Sodium 01/18/2022 136  135 - 145 mmol/L Final   Potassium 01/18/2022 4.3  3.5 - 5.1 mmol/L Final   Chloride 01/18/2022 100  98 - 111 mmol/L Final   CO2 01/18/2022 22  22 - 32 mmol/L Final   Glucose, Bld 01/18/2022 499 (H)  70 - 99 mg/dL Final   Glucose reference range applies only to samples taken after fasting for at least 8 hours.   BUN 01/18/2022 9  8 - 23 mg/dL Final   Creatinine, Ser 01/18/2022 1.23  0.61 - 1.24 mg/dL Final   Calcium 01/18/2022 9.1  8.9 - 10.3 mg/dL Final   Total Protein 01/18/2022 6.6  6.5 - 8.1 g/dL Final   Albumin 01/18/2022 3.2 (L)  3.5 - 5.0 g/dL Final   AST 01/18/2022 18  15 - 41 U/L Final   ALT 01/18/2022 9  0 - 44 U/L Final   Alkaline Phosphatase 01/18/2022 95  38 - 126 U/L Final   Total Bilirubin 01/18/2022 0.3  0.3 - 1.2 mg/dL Final   GFR, Estimated 01/18/2022 >60  >60 mL/min Final   Comment: (NOTE) Calculated using the CKD-EPI Creatinine Equation (2021)    Anion gap 01/18/2022 14  5 - 15 Final   Performed at [facility] [address] [place], [place] [address]   Color, Urine 01/18/2022 YELLOW  YELLOW Final   APPearance 01/18/2022 CLEAR  CLEAR Final   Specific Gravity, Urine 01/18/2022 1.025  1.005 - 1.030 Final   pH 01/18/2022 6.0  5.0 - 8.0 Final   Glucose, UA 01/18/2022 >=500 (A)  NEGATIVE mg/dL Final   Hgb urine dipstick 01/18/2022 NEGATIVE  NEGATIVE Final   Bilirubin Urine 01/18/2022 NEGATIVE  NEGATIVE Final   Ketones, ur 01/18/2022 NEGATIVE  NEGATIVE mg/dL Final   Protein, ur 01/18/2022 NEGATIVE  NEGATIVE mg/dL Final   Nitrite 01/18/2022 NEGATIVE  NEGATIVE Final   Leukocytes,Ua 01/18/2022  SMALL (A)  NEGATIVE Final   RBC / HPF 01/18/2022 0-5  0 - 5 RBC/hpf Final   WBC, UA 01/18/2022 21-50  0 - 5 WBC/hpf Final   Bacteria, UA 01/18/2022 RARE (A)  NONE SEEN Final   Squamous Epithelial / LPF 01/18/2022 0-5  0 - 5 Final   Budding Yeast 01/18/2022 PRESENT   Final   Performed at [facility], [place] [address], [place], [place] [address]   Opiates 01/18/2022 NONE DETECTED  NONE DETECTED Final   Cocaine 01/18/2022 POSITIVE (A)  NONE DETECTED Final   Benzodiazepines 01/18/2022 NONE DETECTED  NONE DETECTED Final   Amphetamines 01/18/2022 NONE DETECTED  NONE DETECTED Final   Tetrahydrocannabinol 01/18/2022 NONE DETECTED  NONE DETECTED Final   Barbiturates 01/18/2022 NONE DETECTED  NONE DETECTED Final   Comment: (NOTE) DRUG SCREEN FOR MEDICAL PURPOSES ONLY.  IF CONFIRMATION IS NEEDED FOR ANY PURPOSE, NOTIFY LAB WITHIN 5 DAYS.  LOWEST DETECTABLE LIMITS FOR URINE DRUG SCREEN Drug Class                     Cutoff (ng/mL) Amphetamine and metabolites    1000 Barbiturate and metabolites    200 Benzodiazepine                 200 Opiates and metabolites        300 Cocaine and metabolites        300 THC                            50 Performed at [facility], [place] [address] [place], [place] [address]    Glucose-Capillary 01/18/2022 483 (H)  70 - 99 mg/dL Final   Glucose reference range applies only to samples taken after fasting for at least 8 hours.   Glucose-Capillary 01/19/2022 586 (HH)  70 - 99 mg/dL Final   Glucose reference range applies only to samples taken after fasting for at least 8 hours.   Glucose-Capillary 01/19/2022 456 (H)  70 - 99 mg/dL Final   Glucose reference range applies only to samples taken after fasting for at least 8 hours.   Glucose-Capillary 01/19/2022 515 (HH)  70 - 99 mg/dL Final   Glucose reference range applies only to samples taken after fasting for at least 8 hours.   Comment 1 01/19/2022 Notify RN   Final   Glucose-Capillary 01/19/2022  164 (H)  70 - 99 mg/dL Final   Glucose reference range applies only to samples taken after fasting for at least 8 hours.  Admission on 01/07/2022, Discharged on 01/07/2022  Component Date Value Ref Range Status   Glucose-Capillary 01/07/2022 267 (H)  70 - 99 mg/dL Final   Glucose reference range applies only to samples taken after fasting for at least 8 hours.  Admission on 01/02/2022, Discharged on 01/02/2022  Component Date Value Ref Range Status   WBC 01/02/2022 7.0  4.0 - 10.5 K/uL Final   RBC 01/02/2022 4.54  4.22 - 5.81 MIL/uL Final   Hemoglobin 01/02/2022 13.9  13.0 - 17.0 g/dL Final   HCT 01/02/2022 41.4  39.0 - 52.0 % Final   MCV 01/02/2022 91.2  80.0 - 100.0 fL Final   MCH 01/02/2022 30.6  26.0 - 34.0 pg Final   MCHC 01/02/2022 33.6  30.0 - 36.0 g/dL Final   RDW 01/02/2022 14.1  11.5 - 15.5 % Final   Platelets 01/02/2022 369  150 - 400 K/uL Final   nRBC 01/02/2022 0.0  0.0 - 0.2 % Final   Neutrophils Relative % 01/02/2022 59  % Final   Neutro Abs 01/02/2022 4.1  1.7 - 7.7 K/uL Final   Lymphocytes Relative 01/02/2022 28  % Final   Lymphs Abs 01/02/2022 1.9  0.7 - 4.0 K/uL Final   Monocytes Relative 01/02/2022 8  % Final   Monocytes Absolute 01/02/2022 0.5  0.1 - 1.0 K/uL Final   Eosinophils Relative 01/02/2022 4  % Final   Eosinophils Absolute 01/02/2022 0.3  0.0 - 0.5 K/uL Final   Basophils Relative 01/02/2022 1  % Final   Basophils Absolute 01/02/2022 0.1  0.0 - 0.1 K/uL Final   Immature Granulocytes 01/02/2022 0  % Final   Abs Immature Granulocytes 01/02/2022 0.02  0.00 - 0.07 K/uL Final   Performed at [facility], [place] [address], [place], [place] [address]   Sodium 01/02/2022 138  135 - 145 mmol/L Final   Potassium 01/02/2022 4.1  3.5 - 5.1 mmol/L Final   Chloride 01/02/2022 104  98 - 111 mmol/L Final   CO2 01/02/2022 25  22 - 32 mmol/L Final   Glucose, Bld 01/02/2022 216 (H)  70 - 99 mg/dL Final   Glucose reference range applies only to samples taken after  fasting for at least 8 hours.   BUN 01/02/2022 11  8 - 23 mg/dL Final   Creatinine, Ser 01/02/2022 0.98  0.61 - 1.24 mg/dL Final   Calcium 01/02/2022 9.5  8.9 - 10.3 mg/dL Final   Total Protein 01/02/2022 7.4  6.5 - 8.1 g/dL Final   Albumin 01/02/2022 3.4 (L)  3.5 - 5.0 g/dL Final   AST 01/02/2022 12 (L)  15 - 41 U/L Final   ALT 01/02/2022 11  0 - 44 U/L Final   Alkaline Phosphatase 01/02/2022 90  38 - 126 U/L Final   Total Bilirubin 01/02/2022 0.9  0.3 - 1.2 mg/dL Final   GFR, Estimated 01/02/2022 >60  >60 mL/min Final   Comment: (NOTE) Calculated using the CKD-EPI Creatinine Equation (2021)    Anion gap 01/02/2022 9  5 - 15 Final   Performed at [facility] [address] [place], [place] [address]  [facility] on 11/01/2021  Component Date Value Ref Range Status   POC Glucose 11/27/2021 301 (A)  70 - 99 mg/dl Final   1 hr after lunch, had not taken insulin  Admission on 10/13/2021, Discharged on 10/15/2021  Component Date Value Ref Range Status   Glucose-Capillary 10/13/2021 388 (H)  70 - 99 mg/dL Final   Glucose reference range applies only to samples taken after fasting for at least 8 hours.   SARS Coronavirus 2 by RT PCR 10/13/2021 NEGATIVE  NEGATIVE Final   Comment: (NOTE) SARS-CoV-2 target nucleic acids are NOT DETECTED.  The SARS-CoV-2 RNA is generally detectable in upper respiratory specimens during the acute phase of infection. The lowest concentration of SARS-CoV-2 viral copies this assay can detect is 138 copies/mL. A negative result does not preclude SARS-Cov-2 infection and should not be used as the sole basis for treatment or other patient management decisions. A negative result may occur with  improper specimen collection/handling, submission of specimen other than nasopharyngeal swab, presence of viral mutation(s) within the areas targeted by this assay, and inadequate number of viral  copies(<138 copies/mL). A negative result must be  combined with clinical observations, patient history, and epidemiological information. The expected result is Negative.  Fact Sheet for Patients:  EntrepreneurPulse.com.au  Fact Sheet for Healthcare Providers:  IncredibleEmployment.be  This test is no                          t yet approved or cleared by the [place] FDA and  has been authorized for detection and/or diagnosis of SARS-CoV-2 by FDA under an Emergency Use Authorization (EUA). This EUA will remain  in effect (meaning this test can be used) for the duration of the COVID-19 declaration under Section 564(b)(1) of the Act, 21 U.S.C.section 360bbb-3(b)(1), unless the authorization is terminated  or revoked sooner.       Influenza A by PCR 10/13/2021 NEGATIVE  NEGATIVE Final   Influenza B by PCR 10/13/2021 NEGATIVE  NEGATIVE Final   Comment: (NOTE) The Xpert Xpress SARS-CoV-2/FLU/RSV plus assay is intended as an aid in the diagnosis of influenza from Nasopharyngeal swab specimens and should not be used as a sole basis for treatment. Nasal washings and aspirates are unacceptable for Xpert Xpress SARS-CoV-2/FLU/RSV testing.  Fact Sheet for Patients: EntrepreneurPulse.com.au  Fact Sheet for Healthcare Providers: IncredibleEmployment.be  This test is not yet approved or cleared by the [place] FDA and has been authorized for detection and/or diagnosis of SARS-CoV-2 by FDA under an Emergency Use Authorization (EUA). This EUA will remain in effect (meaning this test can be used) for the duration of the COVID-19 declaration under Section 564(b)(1) of the Act, 21 U.S.C. section 360bbb-3(b)(1), unless the authorization is terminated or revoked.  Performed at [facility], [place] [address] [place], [place] [address]    WBC 10/13/2021 9.5  4.0 - 10.5 K/uL Final   RBC 10/13/2021 4.23  4.22 - 5.81 MIL/uL Final   Hemoglobin 10/13/2021 13.0   13.0 - 17.0 g/dL Final   HCT 10/13/2021 40.4  39.0 - 52.0 % Final   MCV 10/13/2021 95.5  80.0 - 100.0 fL Final   MCH 10/13/2021 30.7  26.0 - 34.0 pg Final   MCHC 10/13/2021 32.2  30.0 - 36.0 g/dL Final   RDW 10/13/2021 13.4  11.5 - 15.5 % Final   Platelets 10/13/2021 551 (H)  150 - 400 K/uL Final   nRBC 10/13/2021 0.0  0.0 - 0.2 % Final   Neutrophils Relative % 10/13/2021 68  % Final   Neutro Abs 10/13/2021 6.5  1.7 - 7.7 K/uL Final   Lymphocytes Relative 10/13/2021 18  % Final   Lymphs Abs 10/13/2021 1.7  0.7 - 4.0 K/uL Final   Monocytes Relative 10/13/2021 9  % Final   Monocytes Absolute 10/13/2021 0.8  0.1 - 1.0 K/uL Final   Eosinophils Relative 10/13/2021 3  % Final   Eosinophils Absolute 10/13/2021 0.3  0.0 - 0.5 K/uL Final   Basophils Relative 10/13/2021 1  % Final   Basophils Absolute 10/13/2021 0.1  0.0 - 0.1 K/uL Final   Immature Granulocytes 10/13/2021 1  % Final   Abs Immature Granulocytes 10/13/2021 0.11 (H)  0.00 - 0.07 K/uL Final   Performed at [facility], [place] [address] [place], [place] [address]   Sodium 10/13/2021 136  135 - 145 mmol/L Final   Potassium 10/13/2021 4.3  3.5 - 5.1 mmol/L Final   Chloride 10/13/2021 104  98 - 111 mmol/L Final   CO2 10/13/2021 24  22 - 32  mmol/L Final   Glucose, Bld 10/13/2021 296 (H)  70 - 99 mg/dL Final   Glucose reference range applies only to samples taken after fasting for at least 8 hours.   BUN 10/13/2021 14  8 - 23 mg/dL Final   Creatinine, Ser 10/13/2021 1.15  0.61 - 1.24 mg/dL Final   Calcium 10/13/2021 9.0  8.9 - 10.3 mg/dL Final   Total Protein 10/13/2021 7.5  6.5 - 8.1 g/dL Final   Albumin 10/13/2021 3.0 (L)  3.5 - 5.0 g/dL Final   AST 10/13/2021 13 (L)  15 - 41 U/L Final   ALT 10/13/2021 10  0 - 44 U/L Final   Alkaline Phosphatase 10/13/2021 84  38 - 126 U/L Final   Total Bilirubin 10/13/2021 0.5  0.3 - 1.2 mg/dL Final   GFR, Estimated 10/13/2021 >60  >60 mL/min Final   Comment: (NOTE) Calculated using the  CKD-EPI Creatinine Equation (2021)    Anion gap 10/13/2021 8  5 - 15 Final   Performed at [facility], [place] [address] [place], [place] [address]   Color, Urine 10/13/2021 YELLOW  YELLOW Final   APPearance 10/13/2021 CLOUDY (A)  CLEAR Final   Specific Gravity, Urine 10/13/2021 1.022  1.005 - 1.030 Final   pH 10/13/2021 5.0  5.0 - 8.0 Final   Glucose, UA 10/13/2021 >=500 (A)  NEGATIVE mg/dL Final   Hgb urine dipstick 10/13/2021 LARGE (A)  NEGATIVE Final   Bilirubin Urine 10/13/2021 NEGATIVE  NEGATIVE Final   Ketones, ur 10/13/2021 NEGATIVE  NEGATIVE mg/dL Final   Protein, ur 10/13/2021 30 (A)  NEGATIVE mg/dL Final   Nitrite 10/13/2021 NEGATIVE  NEGATIVE Final   Leukocytes,Ua 10/13/2021 LARGE (A)  NEGATIVE Final   RBC / HPF 10/13/2021 >50 (H)  0 - 5 RBC/hpf Final   WBC, UA 10/13/2021 >50 (H)  0 - 5 WBC/hpf Final   Bacteria, UA 10/13/2021 RARE (A)  NONE SEEN Final   Squamous Epithelial / LPF 10/13/2021 0-5  0 - 5 Final   WBC Clumps 10/13/2021 PRESENT   Final   Mucus 10/13/2021 PRESENT   Final   Budding Yeast 10/13/2021 PRESENT   Final   Non Squamous Epithelial 10/13/2021 0-5 (A)  NONE SEEN Final   Performed at [facility], [place] [address] [place], [place] [address]   pH, Ven 10/13/2021 7.457 (H)  7.25 - 7.43 Final   pCO2, Ven 10/13/2021 38.1 (L)  44 - 60 mmHg Final   pO2, Ven 10/13/2021 174 (H)  32 - 45 mmHg Final   Bicarbonate 10/13/2021 26.9  20.0 - 28.0 mmol/L Final   TCO2 10/13/2021 28  22 - 32 mmol/L Final   O2 Saturation 10/13/2021 100  % Final   Acid-Base Excess 10/13/2021 3.0 (H)  0.0 - 2.0 mmol/L Final   Sodium 10/13/2021 137  135 - 145 mmol/L Final   Potassium 10/13/2021 4.3  3.5 - 5.1 mmol/L Final   Calcium, Ion 10/13/2021 1.10 (L)  1.15 - 1.40 mmol/L Final   HCT 10/13/2021 40.0  39.0 - 52.0 % Final   Hemoglobin 10/13/2021 13.6  13.0 - 17.0 g/dL Final   Sample type 10/13/2021 VENOUS   Final   Opiates 10/13/2021 NONE DETECTED  NONE DETECTED Final   Cocaine  10/13/2021 POSITIVE (A)  NONE DETECTED Final   Benzodiazepines 10/13/2021 NONE DETECTED  NONE DETECTED Final   Amphetamines 10/13/2021 NONE DETECTED  NONE DETECTED Final   Tetrahydrocannabinol 10/13/2021 NONE DETECTED  NONE DETECTED Final  Barbiturates 10/13/2021 NONE DETECTED  NONE DETECTED Final   Comment: (NOTE) DRUG SCREEN FOR MEDICAL PURPOSES ONLY.  IF CONFIRMATION IS NEEDED FOR ANY PURPOSE, NOTIFY LAB WITHIN 5 DAYS.  LOWEST DETECTABLE LIMITS FOR URINE DRUG SCREEN Drug Class                     Cutoff (ng/mL) Amphetamine and metabolites    1000 Barbiturate and metabolites    200 Benzodiazepine                 300 Tricyclics and metabolites     300 Opiates and metabolites        300 Cocaine and metabolites        300 THC                            50 Performed at [facility], [place] [address], [place], [place] [address]    Adenovirus 10/13/2021 NOT DETECTED  NOT DETECTED Final   Coronavirus 229E 10/13/2021 NOT DETECTED  NOT DETECTED Final   Comment: (NOTE) The Coronavirus on the Respiratory Panel, DOES NOT test for the novel  Coronavirus (2019 nCoV)    Coronavirus HKU1 10/13/2021 NOT DETECTED  NOT DETECTED Final   Coronavirus NL63 10/13/2021 NOT DETECTED  NOT DETECTED Final   Coronavirus OC43 10/13/2021 NOT DETECTED  NOT DETECTED Final   Metapneumovirus 10/13/2021 NOT DETECTED  NOT DETECTED Final   Rhinovirus / Enterovirus 10/13/2021 NOT DETECTED  NOT DETECTED Final   Influenza A 10/13/2021 NOT DETECTED  NOT DETECTED Final   Influenza B 10/13/2021 NOT DETECTED  NOT DETECTED Final   Parainfluenza Virus 1 10/13/2021 NOT DETECTED  NOT DETECTED Final   Parainfluenza Virus 2 10/13/2021 NOT DETECTED  NOT DETECTED Final   Parainfluenza Virus 3 10/13/2021 NOT DETECTED  NOT DETECTED Final   Parainfluenza Virus 4 10/13/2021 NOT DETECTED  NOT DETECTED Final   Respiratory Syncytial Virus 10/13/2021 NOT DETECTED  NOT DETECTED Final   Bordetella pertussis 10/13/2021 NOT DETECTED   NOT DETECTED Final   Bordetella Parapertussis 10/13/2021 NOT DETECTED  NOT DETECTED Final   Chlamydophila pneumoniae 10/13/2021 NOT DETECTED  NOT DETECTED Final   Mycoplasma pneumoniae 10/13/2021 NOT DETECTED  NOT DETECTED Final   Performed at [facility], [place] [address], [place], [place] [address]   Procalcitonin 10/13/2021 <0.10  ng/mL Final   Comment:        Interpretation: PCT (Procalcitonin) <= 0.5 ng/mL: Systemic infection (sepsis) is not likely. Local bacterial infection is possible. (NOTE)       Sepsis PCT Algorithm           Lower Respiratory Tract                                      Infection PCT Algorithm    ----------------------------     ----------------------------         PCT < 0.25 ng/mL                PCT < 0.10 ng/mL          Strongly encourage             Strongly discourage   discontinuation of antibiotics    initiation of antibiotics    ----------------------------     -----------------------------       PCT 0.25 - 0.50  ng/mL            PCT 0.10 - 0.25 ng/mL               OR       >80% decrease in PCT            Discourage initiation of                                            antibiotics      Encourage discontinuation           of antibiotics    ----------------------------     -----------------------------         PCT >= 0.50 ng/mL              PCT 0.26 - 0.50 ng/mL               AND                                 <80% decrease in PCT             Encourage initiation of                                             antibiotics       Encourage continuation           of antibiotics    ----------------------------     -----------------------------        PCT >= 0.50 ng/mL                  PCT > 0.50 ng/mL               AND         increase in PCT                  Strongly encourage                                      initiation of antibiotics    Strongly encourage escalation           of antibiotics                                      -----------------------------                                           PCT <= 0.25 ng/mL                                                 OR                                        >  80% decrease in PCT                                      Discontinue / Do not initiate                                             antibiotics  Performed at [facility], [place] [address] [place], [place] [address]    B Natriuretic Peptide 10/13/2021 23.6  0.0 - 100.0 pg/mL Final   Performed at [place] [address], [place], [place] [address]   L. pneumophila Serogp 1 Ur Ag 10/13/2021 Negative  Negative Final   Comment: (NOTE) Presumptive negative for L. pneumophila serogroup 1 antigen in urine, suggesting no recent or current infection. Legionnaires' disease cannot be ruled out since other serogroups and species may also cause disease. Performed At: [facility] [place], [place] [id number] Rush Farmer MD [id number]    Source of Sample 10/13/2021 URINE, RANDOM   Final   Performed at [facility], [place] [address] [place], [place] [address]   Strep Pneumo Urinary Antigen 10/13/2021 NEGATIVE  NEGATIVE Final   Comment:        Infection due to S. pneumoniae cannot be absolutely ruled out since the antigen present may be below the detection limit of the test. Performed at [facility], [address] [place], [place] [address]    Sodium 10/14/2021 138  135 - 145 mmol/L Final   Potassium 10/14/2021 4.3  3.5 - 5.1 mmol/L Final   Chloride 10/14/2021 106  98 - 111 mmol/L Final   CO2 10/14/2021 25  22 - 32 mmol/L Final   Glucose, Bld 10/14/2021 286 (H)  70 - 99 mg/dL Final   Glucose reference range applies only to samples taken after fasting for at least 8 hours.   BUN 10/14/2021 14  8 - 23 mg/dL Final   Creatinine, Ser 10/14/2021 0.98  0.61 - 1.24 mg/dL Final   Calcium 10/14/2021 8.9  8.9 - 10.3 mg/dL Final   GFR, Estimated 10/14/2021 >60  >60  mL/min Final   Comment: (NOTE) Calculated using the CKD-EPI Creatinine Equation (2021)    Anion gap 10/14/2021 7  5 - 15 Final   Performed at [facility], [place] [address], [place], [place] [address]   WBC 10/14/2021 9.6  4.0 - 10.5 K/uL Final   RBC 10/14/2021 4.12 (L)  4.22 - 5.81 MIL/uL Final   Hemoglobin 10/14/2021 12.6 (L)  13.0 - 17.0 g/dL Final   HCT 10/14/2021 38.8 (L)  39.0 - 52.0 % Final   MCV 10/14/2021 94.2  80.0 - 100.0 fL Final   MCH 10/14/2021 30.6  26.0 - 34.0 pg Final   MCHC 10/14/2021 32.5  30.0 - 36.0 g/dL Final   RDW 10/14/2021 13.4  11.5 - 15.5 % Final   Platelets 10/14/2021 556 (H)  150 - 400 K/uL Final   nRBC 10/14/2021 0.0  0.0 - 0.2 % Final   Performed at [facility], [place] [address], [place], [place] [address]   Glucose-Capillary 10/13/2021 281 (H)  70 - 99 mg/dL Final   Glucose reference range applies only to samples taken after fasting for at least 8 hours.   Glucose-Capillary  10/13/2021 399 (H)  70 - 99 mg/dL Final   Glucose reference range applies only to samples taken after fasting for at least 8 hours.   Glucose-Capillary 10/14/2021 239 (H)  70 - 99 mg/dL Final   Glucose reference range applies only to samples taken after fasting for at least 8 hours.   Glucose-Capillary 10/14/2021 345 (H)  70 - 99 mg/dL Final   Glucose reference range applies only to samples taken after fasting for at least 8 hours.   Glucose-Capillary 10/14/2021 383 (H)  70 - 99 mg/dL Final   Glucose reference range applies only to samples taken after fasting for at least 8 hours.   Glucose-Capillary 10/15/2021 194 (H)  70 - 99 mg/dL Final   Glucose reference range applies only to samples taken after fasting for at least 8 hours.   Glucose-Capillary 10/15/2021 161 (H)  70 - 99 mg/dL Final   Glucose reference range applies only to samples taken after fasting for at least 8 hours.   Glucose-Capillary 10/15/2021 312 (H)  70 - 99 mg/dL Final   Glucose reference range applies  only to samples taken after fasting for at least 8 hours.   Glucose-Capillary 10/14/2021 279 (H)  70 - 99 mg/dL Final   Glucose reference range applies only to samples taken after fasting for at least 8 hours.  There may be more visits with results that are not included.    Blood Alcohol level:  Lab Results  Component Value Date   ETH <10 03/02/2022   ETH <10 78/29/5621    Metabolic Disorder Labs: Lab Results  Component Value Date   HGBA1C 10.4 (H) 01/21/2022   MPG 251.78 01/21/2022   MPG >398 02/24/2021   No results found for: "PROLACTIN" Lab Results  Component Value Date   CHOL 213 (H) 05/23/2021   TRIG 115 05/23/2021   HDL 71 05/23/2021   CHOLHDL 3.2 11/03/2019   VLDL 32 (H) 05/03/2015   LDLCALC 122 (H) 05/23/2021   LDLCALC 99 11/03/2019    Therapeutic Lab Levels: No results found for: "LITHIUM" No results found for: "VALPROATE" No results found for: "CBMZ"  Physical Findings   GAD-7    Flowsheet Row Office Visit from 10/08/2021 in [place] Office Visit from 09/05/2021 in [place] [facility] Office Visit from 12/26/2020 in [place] Office Visit from 11/17/2018 in [place] Office Visit from 06/05/2017 in [place]  Total GAD-7 Score _0 0 12      PHQ2-9    [place] ED from 03/02/2022 in [facility] ED from 01/21/2022 in [facility] Office Visit from 10/08/2021 in [place] Office Visit from 09/05/2021 in [place] [facility] ED from 07/02/2021 in [facility]  PHQ-2 Total Score 1 0 _1 PHQ-9 Total Score 2 -- _2 Flowsheet Row ED from 03/02/2022 in [facility] Most recent reading at 03/03/2022  1:19 AM ED from 03/01/2022 in [place] Most recent reading at 03/01/2022  5:43 PM ED from 03/01/2022 in [place] Most recent reading at 03/01/2022 12:10 AM  C-SSRS RISK CATEGORY No Risk No Risk No Risk        Musculoskeletal  Strength & Muscle Tone: within normal limits  Gait & Station: normal Patient leans: N/A  Psychiatric Specialty Exam  Presentation  General Appearance:  Appropriate for Environment; Casual  Eye Contact: Good  Speech: Clear and Coherent; Normal Rate  Speech Volume: Normal  Handedness: Right   Mood and Affect  Mood: -- ("ok")  Affect: Congruent; Appropriate   Thought Process  Thought Processes: Coherent; Goal Directed  Descriptions of Associations:Intact  Orientation:Full (Time, Place and Person)  Thought Content:WDL; Logical  Diagnosis of Schizophrenia or Schizoaffective disorder in past: No    Hallucinations:Hallucinations: None  Ideas of Reference:None  Suicidal Thoughts:Suicidal Thoughts: No  Homicidal Thoughts:Homicidal Thoughts: No   Sensorium  Memory: Immediate Good; Recent Good  Judgment: Fair  Insight: Fair   Community education officer  Concentration: Good  Attention Span: Good  Recall: Good  Fund of Knowledge: Good  Language: Good   Psychomotor Activity  Psychomotor Activity: Psychomotor Activity: Normal   Assets  Assets: Communication Skills; Desire for Improvement; Resilience   Sleep  Sleep: Sleep: Poor Number of Hours of Sleep: -1   Nutritional Assessment (For OBS and FBC admissions only) Has the patient had a weight loss or gain of 10 pounds or more in the last 3 months?: No Has the patient had a decrease in food intake/or appetite?: No Does the patient have dental problems?: No Does the patient have eating habits or behaviors that may be indicators of an eating disorder including binging or inducing vomiting?: No Has the patient recently lost weight without trying?:  0 Has the patient been eating poorly because of a decreased appetite?: 0 Malnutrition Screening Tool Score: 0    Physical Exam  Physical Exam Vitals and nursing note reviewed.  Constitutional:      General: He is not in acute distress.    Appearance: Normal appearance. He is normal weight. He is not ill-appearing or toxic-appearing.  HENT:     Head: Normocephalic and atraumatic.  Pulmonary:     Effort: Pulmonary effort is normal.  Musculoskeletal:        General: Normal range of motion.  Neurological:     General: No focal deficit present.     Mental Status: He is alert.    Review of Systems  Respiratory:  Negative for cough and shortness of breath.   Cardiovascular:  Negative for chest pain.  Gastrointestinal:  Negative for abdominal pain, constipation, diarrhea, nausea and vomiting.  Neurological:  Negative for dizziness, weakness and headaches.  Psychiatric/Behavioral:  Negative for depression, hallucinations and suicidal ideas. The patient is not nervous/anxious.    Blood pressure 107/71, pulse 70, temperature 97.9 F (36.6 C), temperature source Oral, resp. rate 17, SpO2 98 %. There is no height or weight on file to calculate BMI.  Treatment Plan Summary: Daily contact with patient to assess and evaluate symptoms and progress in treatment and Medication management  Nahome Bublitz is a 61 yr old male who presented to [facility] on 12/22 requesting Detox from [facility], he was admitted to [facility] on 12/24.  PPHx is significant for Substance Induced Mood Disorder, Cocaine Use Disorder, tobacco use disorder, and no Psychiatric Hospitalizations or Suicide Attempts.  PMHx Is significant for DM2 + neuropathy, HTN, HLD, and housing instability.    Timo is tolerating detox well.  He has been having diarrhea without any fever, nausea, or other symptoms.  This could be due to restarting his metformin as he had not been taking it prior to admission over due to overeating which continues  to be an issue for him.  We will order as needed Imodium to further assist with this.  Given his 1 episode of low blood sugar yesterday we will change his sliding scale insulin from moderate to sensitive.  We will not make any other changes to his medications at this time.  We will continue to monitor.   Substance Induced Mood Disorder: -Continue Cymbalta 60 mg daily for mood disorder and neuropathy     DM2-insulin-dependent with neuropathy Urge Incontinence  BPH: -Adjust SSI from moderate to sensitive -Continue Novolog 3 units BID before lunch and dinner -Continue Semglee 24 Units daily -Continue Glipizide XL 10 mg daily with breakfast -Continue Metformin 1000 mg BID with meals -Continue Gabapentin 600 mg TID for neuropathy -Continue Flomax 0.8 mg daily     GERD: -Continue Pepcid 4m BID     HTN: -Continue Amlodipine 10 mg daily     Hyperlipidemia: -Continue Lipitor 40 mg daily     -Continue PRN's: Tylenol, Maalox, Atarax, Milk of Magnesia, Trazodone      Considerations for follow-up: -Recommend high risk screening every 6-12 months with HIV, RPR, hepatitis panel -PCP for insulin dependent DM2 - uncontrolled -PCP for MOCA and memory     Dispo: pending   ABriant Cedar MD 03/04/2022 10:55 AM

## 2022-03-04 NOTE — ED Notes (Signed)
Patient observed/assessed in room lying on bed sleeping. Patient awoke without difficulty alert and oriented x 4. Affect is flat. Patient denies pain and anxiety. He denies A/V/H. He denies having any thoughts/plan of self harm and harm towards others. Fluid and snack offered. Patient states that appetite has been good throughout the day. Last BM was 03/04/22. Verbalizes no further complaints at this time. Will continue to monitor and support.

## 2022-03-04 NOTE — Tx Team (Signed)
LCSW met with patient to assess current mood, affect, physical state, and inquire about needs/goals while here in [facility] and after discharge. Patient reports he presented due to needing a place to stay until he got his apartment. Patient reports he was at [place] in [place], then reports calling the police who reports "there was a warrant out for his arrest due to failure to appear". Patient reports officers picked him up from [place] and transported him back to [place] for court, and then reports he was released back to the streets. Patient reports he has an upcoming court date on 03/24/2022 or 04/03/2022 stating he is unsure. Patient reports being depressed and cold on the streets so he came back to the emergency room for help. Patient currently denies any SI/HI/AVH. Patient reports being depressed because he was living on the streets and was cold. Patient reports no support and having no access to resources. Patient reports his current goal is to wait at the [facility] until he is able to get to his apartment tomorrow 12/26 to look at it to see if it is appropriate for him. Patient asked if he could get transportation to the apartment complex to get his keys to view it, and then reports if it is not up to his standards then he would return back to the [facility] for residential placement. This Clinician has a rapport with the patient, and LCSW provided counseling to the patient and informed him that mentioned plan is not how things work. Patient explored if residential placement was still an option if his apartment situation did not work out. LCSW informed the patient that all options were explored during his previous admission, and that there were a lot of barriers with placement due to insulin and inability to work. Patient is aware of these things. Patient asked LCSW what he should do, and LCSW encouraged the patient to follow up with representative regarding apartment on tomorrow, and LCSW will follow up once  complete. Patient expressed understanding stating he needs help within the community. LCSW will explore options for community support and will follow up with MD regarding plan.   Lucius Conn, LCSW Clinical Social Worker [facility] Ph: [phone]

## 2022-03-05 ENCOUNTER — Telehealth: Payer: Self-pay | Admitting: *Deleted

## 2022-03-05 DIAGNOSIS — F1910 Other psychoactive substance abuse, uncomplicated: Secondary | ICD-10-CM | POA: Diagnosis not present

## 2022-03-05 DIAGNOSIS — Z794 Long term (current) use of insulin: Secondary | ICD-10-CM | POA: Diagnosis not present

## 2022-03-05 DIAGNOSIS — Z79899 Other long term (current) drug therapy: Secondary | ICD-10-CM | POA: Diagnosis not present

## 2022-03-05 DIAGNOSIS — F1420 Cocaine dependence, uncomplicated: Secondary | ICD-10-CM | POA: Diagnosis not present

## 2022-03-05 LAB — GLUCOSE, CAPILLARY
Glucose-Capillary: 360 mg/dL — ABNORMAL HIGH (ref 70–99)
Glucose-Capillary: 225 mg/dL — ABNORMAL HIGH (ref 70–99)
Glucose-Capillary: 169 mg/dL — ABNORMAL HIGH (ref 70–99)
Glucose-Capillary: 182 mg/dL — ABNORMAL HIGH (ref 70–99)
Glucose-Capillary: 230 mg/dL — ABNORMAL HIGH (ref 70–99)
Glucose-Capillary: 220 mg/dL — ABNORMAL HIGH (ref 70–99)

## 2022-03-05 NOTE — ED Notes (Signed)
Patient A&Ox4. Patient denies SI/HI and AVH. Patient denies any physical complaints when asked. No acute distress noted. Support and encouragement provided. Routine safety checks conducted according to facility protocol. Encouraged patient to notify staff if thoughts of harm toward self or others arise. Patient verbalize understanding and agreement. Will continue to monitor for safety. Q 15 min safety checks remain in place.

## 2022-03-05 NOTE — ED Notes (Signed)
Patient resting quietly in bed with eyes closed. Respirations equal and unlabored, skin warm and dry, NAD. Routine safety checks conducted according to facility protocol. Will continue to monitor for safety.  

## 2022-03-05 NOTE — ED Notes (Signed)
Patient in dayroom eating dinner. Respirations equal and unlabored, skin warm and dry, NAD. No change in assessment or acuity. Q 15 minute safety checks remain in place.   

## 2022-03-05 NOTE — ED Notes (Signed)
Patient observed/assessed in room in bed appearing in no immediate distress resting peacefully. Q15 minute checks continued by MHT and nursing staff. Will continue to monitor and support. 

## 2022-03-05 NOTE — ED Notes (Signed)
Patient A&Ox4. Denies intent to harm self/others when asked. Denies A/VH. Patient denies any physical complaints when asked. No acute distress noted. Support and encouragement provided. Routine safety checks conducted according to facility protocol. Encouraged patient to notify staff if thoughts of harm toward self or others arise. Patient verbalize understanding and agreement. Will continue to monitor for safety.    

## 2022-03-05 NOTE — ED Provider Notes (Addendum)
Behavioral Health Progress Note  Date and Time: 03/05/2022 10:51 AM Name: David [place] MRN:  [id number]  Subjective:   David Wy[place] is a 61 yr old male who presented to [facility] on 12/22 requesting Detox from Crack Cocaine, he was admitted to [facility] on 12/24.  PPHx is significant for Substance Induced Mood Disorder, Cocaine Use Disorder, tobacco use disorder, and no Psychiatric Hospitalizations or Suicide Attempts.  PMHx Is significant for DM2 + neuropathy, HTN, HLD, and housing instability.   Patient reports that he is doing "beautiful." Clarified last cocaine use and patient reports that he last took it 2 days prior to going to the ED (would be 12/21). Denies any physical withdrawal symptoms. He reports his sleep is "beautiful" and eating is "beautiful." He denies SI/HI/AVH. He reports that his mood is "beautiful" because he is in a secure place and it is raining outside. Reports that he is supposed to go see an apartment today but he is not able to get the number for the contact person because it is on his phone. Later on NT was able to retrieve phone for him so that he could get phone number. He reports that he also will not get his check until the 1st, so he reports he will be homeless until then which is a stressor for him. Discussed prior resources provided by social work.    Diagnosis:  Final diagnoses:  Substance abuse ([place])  Cocaine use disorder ([place])    Total Time spent with patient: 30 minutes  Past Psychiatric History: Substance Induced Mood Disorder, Cocaine Use Disorder, tobacco use disorder, and no Psychiatric Hospitalizations or Suicide Attempts.   Past Medical History:  Past Medical History:  Diagnosis Date   Angioedema of lips 07/28/2012   left upper (07/29/2012)   Arthritis    hands and back   Chronic back pain    Cocaine abuse (HCC)    Diabetic neuropathy (HCC)    High cholesterol    Hypertension    Neuropathy    Type II diabetes mellitus ([place])     Past Surgical  History:  Procedure Laterality Date   BACK SURGERY     CYSTOSCOPY W/ URETERAL STENT PLACEMENT Right 02/23/2021   Procedure: CYSTOSCOPY WITH RETROGRADE PYELOGRAM/URETERAL STENT PLACEMENT;  Surgeon: Janith Lima, MD;  Location: [facility];  Service: Urology;  Laterality: Right;   HERNIA REPAIR Right 04/01/2012   I & D EXTREMITY Left 12/13/2012   Procedure: IRRIGATION AND DEBRIDEMENT LEFT THUMB;  Surgeon: Tennis Must, MD;  Location: [place];  Service: Orthopedics;  Laterality: Left;   INCISION AND DRAINAGE OF WOUND     boil on back/notes 07/14/2008 (07/29/2012)   INGUINAL HERNIA REPAIR  04/01/2012   Procedure: HERNIA REPAIR INGUINAL ADULT;  Surgeon: Haywood Lasso, MD;  Location: [place];  Service: General;  Laterality: Right;   INGUINAL HERNIA REPAIR Left 09/06/2016   Procedure: OPEN REPAIR LEFT INGUINAL HERNIA;  Surgeon: Greer Pickerel, MD;  Location: [place];  Service: General;  Laterality: Left;   INSERTION OF MESH Left 09/06/2016   Procedure: INSERTION OF MESH;  Surgeon: Greer Pickerel, MD;  Location: [place];  Service: General;  Laterality: Left;   TONSILLECTOMY     Family History:  Family History  Problem Relation Age of Onset   Diabetes Mother    Kidney disease Mother    Hyperlipidemia Mother    Hyperlipidemia Father    Diabetes Father    Family Psychiatric  History: None Reported  Social History:  Social History   Substance and Sexual Activity  Alcohol Use No   Alcohol/week: 0.0 standard drinks of alcohol     Social History   Substance and Sexual Activity  Drug Use Not Currently   Types: "Crack" cocaine    Social History   Socioeconomic History   Marital status: Divorced    Spouse name: Not on file   Number of children: 3   Years of education: 12   Highest education level: Not on file  Occupational History    Comment: disabled  Tobacco Use   Smoking status: Every Day    Packs/day: 0.25    Years: 30.00    Total pack years: 7.50    Types: Cigarettes   Smokeless tobacco:  Former    Quit date: 08/15/2015   Tobacco comments:    04/29/16 2  cigs daily, 10/27/17 sometimes < .25 PPD  Vaping Use   Vaping Use: Never used  Substance and Sexual Activity   Alcohol use: No    Alcohol/week: 0.0 standard drinks of alcohol   Drug use: Not Currently    Types: "Crack" cocaine   Sexual activity: Not on file  Other Topics Concern   Not on file  Social History Narrative   04/29/17   Lives in shelter   Caffeine- a lot of  tea, coffee   Social Determinants of Health   Financial Resource Strain: Not on file  Food Insecurity: Not on file  Transportation Needs: Not on file  Physical Activity: Not on file  Stress: Not on file  Social Connections: Not on file   SDOH:  SDOH Screenings   Depression (PHQ2-9): Low Risk  (03/03/2022)  Recent Concern: Depression (PHQ2-9) - Medium Risk (03/03/2022)  Tobacco Use: High Risk (03/01/2022)   Additional Social History:    Pain Medications: See MAR Prescriptions: See MAR Over the Counter: See MAR History of alcohol / drug use?: Yes Longest period of sobriety (when/how long): Hx of crack cocaine use - clean 2-3 months now Negative Consequences of Use: Financial Withdrawal Symptoms: None Name of Substance 1: Cocaine (crack) 1 - Age of First Use: 20s 1 - Amount (size/oz): Varies 1 - Frequency: Daily when available 1 - Duration: Ongoing 1 - Last Use / Amount: 1 month ago 1 - Method of Aquiring: Unknown 1- Route of Use: Smoke inhalation                  Sleep: "beautiful"  Appetite:  "beautiful"  Current Medications:  Current Facility-Administered Medications  Medication Dose Route Frequency Provider Last Rate Last Admin   acetaminophen (TYLENOL) tablet 650 mg  650 mg Oral Q6H PRN Bobbitt, Shalon E, NP   650 mg at 03/04/22 1449   alum & mag hydroxide-simeth (MAALOX/MYLANTA) 200-200-20 MG/5ML suspension 30 mL  30 mL Oral Q4H PRN Bobbitt, Shalon E, NP       amLODipine (NORVASC) tablet 10 mg  10 mg Oral Daily Bobbitt,  Shalon E, NP   10 mg at 03/05/22 0930   atorvastatin (LIPITOR) tablet 40 mg  40 mg Oral Daily Bobbitt, Shalon E, NP   40 mg at 03/05/22 0931   DULoxetine (CYMBALTA) DR capsule 60 mg  60 mg Oral Daily Bobbitt, Shalon E, NP   60 mg at 03/05/22 0930   famotidine (PEPCID) tablet 10 mg  10 mg Oral BID Bobbitt, Shalon E, NP   10 mg at 03/05/22 0930   gabapentin (NEURONTIN) capsule 600 mg  600 mg Oral TID Bobbitt, Shalon E,  NP   600 mg at 03/05/22 0931   glipiZIDE (GLUCOTROL XL) 24 hr tablet 10 mg  10 mg Oral Q breakfast Bobbitt, Shalon E, NP   10 mg at 03/05/22 0817   hydrOXYzine (ATARAX) tablet 25 mg  25 mg Oral TID PRN Bobbitt, Shalon E, NP   25 mg at 03/04/22 2102   insulin aspart (novoLOG) injection 0-9 Units  0-9 Units Subcutaneous TID WC Briant Cedar, MD   2 Units at 03/05/22 0824   insulin aspart (novoLOG) injection 3 Units  3 Units Subcutaneous BID AC Bobbitt, Shalon E, NP   3 Units at 03/04/22 1705   insulin glargine-yfgn (SEMGLEE) injection 24 Units  24 Units Subcutaneous Daily Hampton Abbot, MD   24 Units at 03/05/22 0932   loperamide (IMODIUM) capsule 2-4 mg  2-4 mg Oral PRN Briant Cedar, MD       magnesium hydroxide (MILK OF MAGNESIA) suspension 30 mL  30 mL Oral Daily PRN Bobbitt, Shalon E, NP       metFORMIN (GLUCOPHAGE) tablet 1,000 mg  1,000 mg Oral BID WC Bobbitt, Shalon E, NP   1,000 mg at 03/05/22 0817   tamsulosin (FLOMAX) capsule 0.8 mg  0.8 mg Oral Daily Bobbitt, Shalon E, NP   0.8 mg at 03/05/22 0930   traZODone (DESYREL) tablet 100 mg  100 mg Oral QHS PRN Bobbitt, Shalon E, NP   100 mg at 03/04/22 2102   Current Outpatient Medications  Medication Sig Dispense Refill   glipiZIDE (GLUCOTROL) 5 MG tablet Take 10 mg by mouth every morning.     amLODipine (NORVASC) 10 MG tablet Take 1 tablet (10 mg total) by mouth daily. 30 tablet 0   atorvastatin (LIPITOR) 40 MG tablet Take 1 tablet (40 mg total) by mouth daily. 30 tablet 0   Blood Glucose Monitoring Suppl  (ACCU-CHEK GUIDE) w/Device KIT USE AS DIRECTED TO CHECK BLOOD SUGARS UP TO TWICE PER DAY 1 kit 0   gabapentin (NEURONTIN) 300 MG capsule Take 2 capsules (600 mg total) by mouth 3 (three) times daily. 180 capsule 0   insulin aspart (NOVOLOG) 100 UNIT/ML injection Inject 3 Units into the skin 2 (two) times daily before lunch and supper. 10 mL 0   Insulin Glargine Solostar (LANTUS) 100 UNIT/ML Solostar Pen Inject 24 Units into the skin daily. 15 mL 2   metFORMIN (GLUCOPHAGE) 1000 MG tablet Take 1 tablet (1,000 mg total) by mouth 2 (two) times daily. 60 tablet 0   tamsulosin (FLOMAX) 0.4 MG CAPS capsule Take 2 capsules (0.8 mg total) by mouth daily. For renal calculi 60 capsule 0   traZODone (DESYREL) 100 MG tablet Take 1 tablet (100 mg total) by mouth at bedtime as needed for sleep. 15 tablet 0    Labs  Lab Results:  Admission on 03/02/2022  Component Date Value Ref Range Status   TSH 03/02/2022 0.735  0.350 - 4.500 uIU/mL Final   Comment: Performed by a 3rd Generation assay with a functional sensitivity of <=0.01 uIU/mL. Performed at [facility], [place] [address] [place], [place] [address]    Glucose-Capillary 03/03/2022 281 (H)  70 - 99 mg/dL Final   Glucose reference range applies only to samples taken after fasting for at least 8 hours.   Glucose-Capillary 03/03/2022 147 (H)  70 - 99 mg/dL Final   Glucose reference range applies only to samples taken after fasting for at least 8 hours.   Glucose-Capillary 03/03/2022 70  70 - 99 mg/dL  Final   Glucose reference range applies only to samples taken after fasting for at least 8 hours.   Glucose-Capillary 03/03/2022 134 (H)  70 - 99 mg/dL Final   Glucose reference range applies only to samples taken after fasting for at least 8 hours.   Glucose-Capillary 03/03/2022 135 (H)  70 - 99 mg/dL Final   Glucose reference range applies only to samples taken after fasting for at least 8 hours.   Glucose-Capillary 03/04/2022 174 (H)  70 - 99 mg/dL  Final   Glucose reference range applies only to samples taken after fasting for at least 8 hours.   Glucose-Capillary 03/04/2022 176 (H)  70 - 99 mg/dL Final   Glucose reference range applies only to samples taken after fasting for at least 8 hours.   Glucose-Capillary 03/04/2022 224 (H)  70 - 99 mg/dL Final   Glucose reference range applies only to samples taken after fasting for at least 8 hours.   Glucose-Capillary 03/05/2022 169 (H)  70 - 99 mg/dL Final   Glucose reference range applies only to samples taken after fasting for at least 8 hours.   Glucose-Capillary 03/03/2022 360 (H)  70 - 99 mg/dL Final   Glucose reference range applies only to samples taken after fasting for at least 8 hours.   Glucose-Capillary 03/04/2022 225 (H)  70 - 99 mg/dL Final   Glucose reference range applies only to samples taken after fasting for at least 8 hours.   Glucose-Capillary 03/05/2022 182 (H)  70 - 99 mg/dL Final   Glucose reference range applies only to samples taken after fasting for at least 8 hours.  Admission on 03/01/2022, Discharged on 03/02/2022  Component Date Value Ref Range Status   WBC 03/02/2022 6.5  4.0 - 10.5 K/uL Final   RBC 03/02/2022 4.21 (L)  4.22 - 5.81 MIL/uL Final   Hemoglobin 03/02/2022 12.8 (L)  13.0 - 17.0 g/dL Final   HCT 03/02/2022 40.2  39.0 - 52.0 % Final   MCV 03/02/2022 95.5  80.0 - 100.0 fL Final   MCH 03/02/2022 30.4  26.0 - 34.0 pg Final   MCHC 03/02/2022 31.8  30.0 - 36.0 g/dL Final   RDW 03/02/2022 14.2  11.5 - 15.5 % Final   Platelets 03/02/2022 372  150 - 400 K/uL Final   nRBC 03/02/2022 0.0  0.0 - 0.2 % Final   Neutrophils Relative % 03/02/2022 60  % Final   Neutro Abs 03/02/2022 4.0  1.7 - 7.7 K/uL Final   Lymphocytes Relative 03/02/2022 26  % Final   Lymphs Abs 03/02/2022 1.7  0.7 - 4.0 K/uL Final   Monocytes Relative 03/02/2022 8  % Final   Monocytes Absolute 03/02/2022 0.5  0.1 - 1.0 K/uL Final   Eosinophils Relative 03/02/2022 4  % Final    Eosinophils Absolute 03/02/2022 0.2  0.0 - 0.5 K/uL Final   Basophils Relative 03/02/2022 1  % Final   Basophils Absolute 03/02/2022 0.0  0.0 - 0.1 K/uL Final   Immature Granulocytes 03/02/2022 1  % Final   Abs Immature Granulocytes 03/02/2022 0.03  0.00 - 0.07 K/uL Final   Performed at [facility], [place] [address], [place], [place] [address]   Sodium 03/02/2022 130 (L)  135 - 145 mmol/L Final   Potassium 03/02/2022 4.4  3.5 - 5.1 mmol/L Final   Chloride 03/02/2022 96 (L)  98 - 111 mmol/L Final   CO2 03/02/2022 24  22 - 32 mmol/L Final   Glucose, Bld  03/02/2022 709 (HH)  70 - 99 mg/dL Final   Comment: CRITICAL RESULT CALLED TO, READ BACK BY AND VERIFIED WITH J. NEWTON RN 03/02/22 _0  BY J. WHITE Glucose reference range applies only to samples taken after fasting for at least 8 hours.    BUN 03/02/2022 16  8 - 23 mg/dL Final   Creatinine, Ser 03/02/2022 1.36 (H)  0.61 - 1.24 mg/dL Final   Calcium 03/02/2022 8.7 (L)  8.9 - 10.3 mg/dL Final   Total Protein 03/02/2022 6.5  6.5 - 8.1 g/dL Final   Albumin 03/02/2022 3.0 (L)  3.5 - 5.0 g/dL Final   AST 03/02/2022 35  15 - 41 U/L Final   ALT 03/02/2022 22  0 - 44 U/L Final   Alkaline Phosphatase 03/02/2022 92  38 - 126 U/L Final   Total Bilirubin 03/02/2022 0.2 (L)  0.3 - 1.2 mg/dL Final   GFR, Estimated 03/02/2022 59 (L)  >60 mL/min Final   Comment: (NOTE) Calculated using the CKD-EPI Creatinine Equation (2021)    Anion gap 03/02/2022 10  5 - 15 Final   Performed at [facility] [address] [place], [place] [address]   Alcohol, Ethyl (B) 03/02/2022 <10  <10 mg/dL Final   Comment: (NOTE) Lowest detectable limit for serum alcohol is 10 mg/dL.  For medical purposes only. Performed at [facility], [place] [address] [place], [place] [address]    Opiates 03/02/2022 NONE DETECTED  NONE DETECTED Final   Cocaine 03/02/2022 POSITIVE (A)  NONE DETECTED Final   Benzodiazepines 03/02/2022 NONE DETECTED  NONE DETECTED Final    Amphetamines 03/02/2022 NONE DETECTED  NONE DETECTED Final   Tetrahydrocannabinol 03/02/2022 NONE DETECTED  NONE DETECTED Final   Barbiturates 03/02/2022 NONE DETECTED  NONE DETECTED Final   Comment: (NOTE) DRUG SCREEN FOR MEDICAL PURPOSES ONLY.  IF CONFIRMATION IS NEEDED FOR ANY PURPOSE, NOTIFY LAB WITHIN 5 DAYS.  LOWEST DETECTABLE LIMITS FOR URINE DRUG SCREEN Drug Class                     Cutoff (ng/mL) Amphetamine and metabolites    1000 Barbiturate and metabolites    200 Benzodiazepine                 200 Opiates and metabolites        300 Cocaine and metabolites        300 THC                            50 Performed at [facility], [place] [address], [place], [place] [address]    Color, Urine 03/02/2022 STRAW (A)  YELLOW Final   APPearance 03/02/2022 HAZY (A)  CLEAR Final   Specific Gravity, Urine 03/02/2022 1.023  1.005 - 1.030 Final   pH 03/02/2022 6.0  5.0 - 8.0 Final   Glucose, UA 03/02/2022 >=500 (A)  NEGATIVE mg/dL Final   Hgb urine dipstick 03/02/2022 NEGATIVE  NEGATIVE Final   Bilirubin Urine 03/02/2022 NEGATIVE  NEGATIVE Final   Ketones, ur 03/02/2022 NEGATIVE  NEGATIVE mg/dL Final   Protein, ur 03/02/2022 NEGATIVE  NEGATIVE mg/dL Final   Nitrite 03/02/2022 NEGATIVE  NEGATIVE Final   Leukocytes,Ua 03/02/2022 LARGE (A)  NEGATIVE Final   RBC / HPF 03/02/2022 0-5  0 - 5 RBC/hpf Final   WBC, UA 03/02/2022 >50 (H)  0 - 5 WBC/hpf Final   Bacteria, UA 03/02/2022 RARE (A)  NONE SEEN Final   Squamous Epithelial / LPF 03/02/2022 0-5  0 - 5 Final   WBC Clumps 03/02/2022 PRESENT   Final   Mucus 03/02/2022 PRESENT   Final   Budding Yeast 03/02/2022 PRESENT   Final   Performed at [facility], [place] [address], [place], [place] [address]   SARS Coronavirus 2 by RT PCR 03/02/2022 NEGATIVE  NEGATIVE Final   Comment: (NOTE) SARS-CoV-2 target nucleic acids are NOT DETECTED.  The SARS-CoV-2 RNA is generally detectable in upper respiratory specimens during the acute  phase of infection. The lowest concentration of SARS-CoV-2 viral copies this assay can detect is 138 copies/mL. A negative result does not preclude SARS-Cov-2 infection and should not be used as the sole basis for treatment or other patient management decisions. A negative result may occur with  improper specimen collection/handling, submission of specimen other than nasopharyngeal swab, presence of viral mutation(s) within the areas targeted by this assay, and inadequate number of viral copies(<138 copies/mL). A negative result must be combined with clinical observations, patient history, and epidemiological information. The expected result is Negative.  Fact Sheet for Patients:  EntrepreneurPulse.com.au  Fact Sheet for Healthcare Providers:  IncredibleEmployment.be  This test is no                          t yet approved or cleared by the [place] FDA and  has been authorized for detection and/or diagnosis of SARS-CoV-2 by FDA under an Emergency Use Authorization (EUA). This EUA will remain  in effect (meaning this test can be used) for the duration of the COVID-19 declaration under Section 564(b)(1) of the Act, 21 U.S.C.section 360bbb-3(b)(1), unless the authorization is terminated  or revoked sooner.       Influenza A by PCR 03/02/2022 NEGATIVE  NEGATIVE Final   Influenza B by PCR 03/02/2022 NEGATIVE  NEGATIVE Final   Comment: (NOTE) The Xpert Xpress SARS-CoV-2/FLU/RSV plus assay is intended as an aid in the diagnosis of influenza from Nasopharyngeal swab specimens and should not be used as a sole basis for treatment. Nasal washings and aspirates are unacceptable for Xpert Xpress SARS-CoV-2/FLU/RSV testing.  Fact Sheet for Patients: EntrepreneurPulse.com.au  Fact Sheet for Healthcare Providers: IncredibleEmployment.be  This test is not yet approved or cleared by the [place] FDA and has  been authorized for detection and/or diagnosis of SARS-CoV-2 by FDA under an Emergency Use Authorization (EUA). This EUA will remain in effect (meaning this test can be used) for the duration of the COVID-19 declaration under Section 564(b)(1) of the Act, 21 U.S.C. section 360bbb-3(b)(1), unless the authorization is terminated or revoked.     Resp Syncytial Virus by PCR 03/02/2022 NEGATIVE  NEGATIVE Final   Comment: (NOTE) Fact Sheet for Patients: EntrepreneurPulse.com.au  Fact Sheet for Healthcare Providers: IncredibleEmployment.be  This test is not yet approved or cleared by the [place] FDA and has been authorized for detection and/or diagnosis of SARS-CoV-2 by FDA under an Emergency Use Authorization (EUA). This EUA will remain in effect (meaning this test can be used) for the duration of the COVID-19 declaration under Section 564(b)(1) of the Act, 21 U.S.C. section 360bbb-3(b)(1), unless the authorization is terminated or revoked.  Performed at [facility], [place] [address] [place], [place] [address]    Specimen Description 03/02/2022 URINE, CLEAN CATCH   Final   Special Requests 03/02/2022 NONE   Final   Culture 03/02/2022  (A)   Final  Value:<10,000 COLONIES/mL INSIGNIFICANT GROWTH Performed at [facility] [address], [place], [place] [address]    Report Status 03/02/2022 03/04/2022 FINAL   Final   Glucose-Capillary 03/02/2022 >600 (HH)  70 - 99 mg/dL Final   Glucose reference range applies only to samples taken after fasting for at least 8 hours.   Comment 1 03/02/2022 Document in Chart   Final   Glucose-Capillary 03/02/2022 241 (H)  70 - 99 mg/dL Final   Glucose reference range applies only to samples taken after fasting for at least 8 hours.   Comment 1 03/02/2022 Document in Chart   Final   Glucose-Capillary 03/02/2022 390 (H)  70 - 99 mg/dL Final   Glucose reference range applies only to  samples taken after fasting for at least 8 hours.  Admission on 03/01/2022, Discharged on 03/01/2022  Component Date Value Ref Range Status   Glucose-Capillary 03/01/2022 289 (H)  70 - 99 mg/dL Final   Glucose reference range applies only to samples taken after fasting for at least 8 hours.  Admission on 01/21/2022, Discharged on 02/06/2022  Component Date Value Ref Range Status   Hgb A1c MFr Bld 01/21/2022 10.4 (H)  4.8 - 5.6 % Final   Comment: (NOTE) Pre diabetes:          5.7%-6.4%  Diabetes:              >6.4%  Glycemic control for   <7.0% adults with diabetes    Mean Plasma Glucose 01/21/2022 251.78  mg/dL Final   Performed at [facility] [address], [place], [place] [address]   TSH 01/21/2022 0.248 (L)  0.350 - 4.500 uIU/mL Final   Comment: Performed by a 3rd Generation assay with a functional sensitivity of <=0.01 uIU/mL. Performed at [facility], [place] [address] [place], [place] [address]    Glucose-Capillary 01/21/2022 374 (H)  70 - 99 mg/dL Final   Glucose reference range applies only to samples taken after fasting for at least 8 hours.   Glucose-Capillary 01/21/2022 289 (H)  70 - 99 mg/dL Final   Glucose reference range applies only to samples taken after fasting for at least 8 hours.   Glucose-Capillary 01/22/2022 137 (H)  70 - 99 mg/dL Final   Glucose reference range applies only to samples taken after fasting for at least 8 hours.   Glucose-Capillary 01/22/2022 297 (H)  70 - 99 mg/dL Final   Glucose reference range applies only to samples taken after fasting for at least 8 hours.   T3, Free 01/23/2022 2.2  2.0 - 4.4 pg/mL Final   Comment: (NOTE) Performed At: [facility] [place], [place] [id number] Rush Farmer MD [id number]    Glucose-Capillary 01/22/2022 94  70 - 99 mg/dL Final   Glucose reference range applies only to samples taken after fasting for at least 8 hours.   Free T4 01/23/2022 0.72  0.61 - 1.12 ng/dL  Final   Comment: (NOTE) Biotin ingestion may interfere with free T4 tests. If the results are inconsistent with the TSH level, previous test results, or the clinical presentation, then consider biotin interference. If needed, order repeat testing after stopping biotin. Performed at [facility], [place] [address], [place], [place] [address]    TSH 01/23/2022 0.203 (L)  0.350 - 4.500 uIU/mL Final   Comment: Performed by a 3rd Generation assay with a functional sensitivity of <=0.01 uIU/mL. Performed at [facility], [place]  [address] [place], [place] [address]    Glucose-Capillary 01/23/2022 132 (H)  70 - 99 mg/dL Final   Glucose reference range applies only to samples taken after fasting for at least 8 hours.   Glucose-Capillary 01/22/2022 59 (L)  70 - 99 mg/dL Final   Glucose reference range applies only to samples taken after fasting for at least 8 hours.   Glucose-Capillary 01/22/2022 177 (H)  70 - 99 mg/dL Final   Glucose reference range applies only to samples taken after fasting for at least 8 hours.   Glucose-Capillary 01/23/2022 284 (H)  70 - 99 mg/dL Final   Glucose reference range applies only to samples taken after fasting for at least 8 hours.   Glucose-Capillary 01/23/2022 188 (H)  70 - 99 mg/dL Final   Glucose reference range applies only to samples taken after fasting for at least 8 hours.   Glucose-Capillary 01/23/2022 294 (H)  70 - 99 mg/dL Final   Glucose reference range applies only to samples taken after fasting for at least 8 hours.   Glucose-Capillary 01/24/2022 180 (H)  70 - 99 mg/dL Final   Glucose reference range applies only to samples taken after fasting for at least 8 hours.   Glucose-Capillary 01/24/2022 275 (H)  70 - 99 mg/dL Final   Glucose reference range applies only to samples taken after fasting for at least 8 hours.   Glucose-Capillary 01/24/2022 146 (H)  70 - 99 mg/dL Final   Glucose reference range applies only to samples taken after fasting  for at least 8 hours.   Glucose-Capillary 01/24/2022 137 (H)  70 - 99 mg/dL Final   Glucose reference range applies only to samples taken after fasting for at least 8 hours.   Glucose-Capillary 01/25/2022 201 (H)  70 - 99 mg/dL Final   Glucose reference range applies only to samples taken after fasting for at least 8 hours.   Glucose-Capillary 01/25/2022 195 (H)  70 - 99 mg/dL Final   Glucose reference range applies only to samples taken after fasting for at least 8 hours.   Glucose-Capillary 01/25/2022 149 (H)  70 - 99 mg/dL Final   Glucose reference range applies only to samples taken after fasting for at least 8 hours.   Glucose-Capillary 01/25/2022 147 (H)  70 - 99 mg/dL Final   Glucose reference range applies only to samples taken after fasting for at least 8 hours.   Glucose-Capillary 01/26/2022 123 (H)  70 - 99 mg/dL Final   Glucose reference range applies only to samples taken after fasting for at least 8 hours.   Glucose-Capillary 01/26/2022 176 (H)  70 - 99 mg/dL Final   Glucose reference range applies only to samples taken after fasting for at least 8 hours.   Glucose-Capillary 01/26/2022 84  70 - 99 mg/dL Final   Glucose reference range applies only to samples taken after fasting for at least 8 hours.   Glucose-Capillary 01/26/2022 177 (H)  70 - 99 mg/dL Final   Glucose reference range applies only to samples taken after fasting for at least 8 hours.   Glucose-Capillary 01/27/2022 150 (H)  70 - 99 mg/dL Final   Glucose reference range applies only to samples taken after fasting for at least 8 hours.   Glucose-Capillary 01/27/2022 171 (H)  70 - 99 mg/dL Final   Glucose reference range applies only to samples taken after fasting for at least 8 hours.   Glucose-Capillary 01/27/2022 159 (H)  70 - 99 mg/dL Final   Glucose  reference range applies only to samples taken after fasting for at least 8 hours.   Glucose-Capillary 01/27/2022 67 (L)  70 - 99 mg/dL Final   Glucose reference  range applies only to samples taken after fasting for at least 8 hours.   Glucose-Capillary 01/27/2022 107 (H)  70 - 99 mg/dL Final   Glucose reference range applies only to samples taken after fasting for at least 8 hours.   Glucose-Capillary 01/27/2022 144 (H)  70 - 99 mg/dL Final   Glucose reference range applies only to samples taken after fasting for at least 8 hours.   Glucose-Capillary 01/28/2022 175 (H)  70 - 99 mg/dL Final   Glucose reference range applies only to samples taken after fasting for at least 8 hours.   Glucose-Capillary 01/28/2022 198 (H)  70 - 99 mg/dL Final   Glucose reference range applies only to samples taken after fasting for at least 8 hours.   Glucose-Capillary 01/28/2022 186 (H)  70 - 99 mg/dL Final   Glucose reference range applies only to samples taken after fasting for at least 8 hours.   Glucose-Capillary 01/28/2022 198 (H)  70 - 99 mg/dL Final   Glucose reference range applies only to samples taken after fasting for at least 8 hours.   Glucose-Capillary 01/29/2022 111 (H)  70 - 99 mg/dL Final   Glucose reference range applies only to samples taken after fasting for at least 8 hours.   Glucose-Capillary 01/29/2022 160 (H)  70 - 99 mg/dL Final   Glucose reference range applies only to samples taken after fasting for at least 8 hours.   Glucose-Capillary 01/29/2022 209 (H)  70 - 99 mg/dL Final   Glucose reference range applies only to samples taken after fasting for at least 8 hours.   Glucose-Capillary 01/29/2022 155 (H)  70 - 99 mg/dL Final   Glucose reference range applies only to samples taken after fasting for at least 8 hours.   Glucose-Capillary 01/30/2022 157 (H)  70 - 99 mg/dL Final   Glucose reference range applies only to samples taken after fasting for at least 8 hours.   Glucose-Capillary 01/30/2022 168 (H)  70 - 99 mg/dL Final   Glucose reference range applies only to samples taken after fasting for at least 8 hours.   Glucose-Capillary  01/30/2022 217 (H)  70 - 99 mg/dL Final   Glucose reference range applies only to samples taken after fasting for at least 8 hours.   Glucose-Capillary 01/30/2022 246 (H)  70 - 99 mg/dL Final   Glucose reference range applies only to samples taken after fasting for at least 8 hours.   Glucose-Capillary 01/30/2022 287 (H)  70 - 99 mg/dL Final   Glucose reference range applies only to samples taken after fasting for at least 8 hours.   Glucose-Capillary 01/31/2022 138 (H)  70 - 99 mg/dL Final   Glucose reference range applies only to samples taken after fasting for at least 8 hours.   Glucose-Capillary 01/31/2022 193 (H)  70 - 99 mg/dL Final   Glucose reference range applies only to samples taken after fasting for at least 8 hours.   Glucose-Capillary 01/31/2022 274 (H)  70 - 99 mg/dL Final   Glucose reference range applies only to samples taken after fasting for at least 8 hours.   Glucose-Capillary 01/31/2022 320 (H)  70 - 99 mg/dL Final   Glucose reference range applies only to samples taken after fasting for at least 8 hours.   Glucose-Capillary 01/31/2022  160 (H)  70 - 99 mg/dL Final   Glucose reference range applies only to samples taken after fasting for at least 8 hours.   Glucose-Capillary 02/01/2022 184 (H)  70 - 99 mg/dL Final   Glucose reference range applies only to samples taken after fasting for at least 8 hours.   Glucose-Capillary 02/01/2022 138 (H)  70 - 99 mg/dL Final   Glucose reference range applies only to samples taken after fasting for at least 8 hours.   Glucose-Capillary 02/01/2022 171 (H)  70 - 99 mg/dL Final   Glucose reference range applies only to samples taken after fasting for at least 8 hours.   Glucose-Capillary 02/01/2022 124 (H)  70 - 99 mg/dL Final   Glucose reference range applies only to samples taken after fasting for at least 8 hours.   Glucose-Capillary 02/02/2022 112 (H)  70 - 99 mg/dL Final   Glucose reference range applies only to samples taken  after fasting for at least 8 hours.   Glucose-Capillary 02/02/2022 125 (H)  70 - 99 mg/dL Final   Glucose reference range applies only to samples taken after fasting for at least 8 hours.   Glucose-Capillary 02/02/2022 274 (H)  70 - 99 mg/dL Final   Glucose reference range applies only to samples taken after fasting for at least 8 hours.   Glucose-Capillary 02/02/2022 239 (H)  70 - 99 mg/dL Final   Glucose reference range applies only to samples taken after fasting for at least 8 hours.   Glucose-Capillary 02/02/2022 190 (H)  70 - 99 mg/dL Final   Glucose reference range applies only to samples taken after fasting for at least 8 hours.   Glucose-Capillary 02/02/2022 183 (H)  70 - 99 mg/dL Final   Glucose reference range applies only to samples taken after fasting for at least 8 hours.   Glucose-Capillary 02/03/2022 113 (H)  70 - 99 mg/dL Final   Glucose reference range applies only to samples taken after fasting for at least 8 hours.   Glucose-Capillary 02/03/2022 158 (H)  70 - 99 mg/dL Final   Glucose reference range applies only to samples taken after fasting for at least 8 hours.   Glucose-Capillary 02/03/2022 100 (H)  70 - 99 mg/dL Final   Glucose reference range applies only to samples taken after fasting for at least 8 hours.   Glucose-Capillary 02/03/2022 162 (H)  70 - 99 mg/dL Final   Glucose reference range applies only to samples taken after fasting for at least 8 hours.   Glucose-Capillary 02/03/2022 114 (H)  70 - 99 mg/dL Final   Glucose reference range applies only to samples taken after fasting for at least 8 hours.   Glucose-Capillary 02/04/2022 138 (H)  70 - 99 mg/dL Final   Glucose reference range applies only to samples taken after fasting for at least 8 hours.   Glucose-Capillary 02/04/2022 157 (H)  70 - 99 mg/dL Final   Glucose reference range applies only to samples taken after fasting for at least 8 hours.   Glucose-Capillary 02/04/2022 208 (H)  70 - 99 mg/dL Final    Glucose reference range applies only to samples taken after fasting for at least 8 hours.   Glucose-Capillary 02/04/2022 212 (H)  70 - 99 mg/dL Final   Glucose reference range applies only to samples taken after fasting for at least 8 hours.   Glucose-Capillary 02/05/2022 184 (H)  70 - 99 mg/dL Final   Glucose reference range applies only to samples taken  after fasting for at least 8 hours.   Glucose-Capillary 02/05/2022 192 (H)  70 - 99 mg/dL Final   Glucose reference range applies only to samples taken after fasting for at least 8 hours.   Glucose-Capillary 02/05/2022 128 (H)  70 - 99 mg/dL Final   Glucose reference range applies only to samples taken after fasting for at least 8 hours.   Glucose-Capillary 02/05/2022 165 (H)  70 - 99 mg/dL Final   Glucose reference range applies only to samples taken after fasting for at least 8 hours.   Glucose-Capillary 02/06/2022 111 (H)  70 - 99 mg/dL Final   Glucose reference range applies only to samples taken after fasting for at least 8 hours.   Glucose-Capillary 02/05/2022 262 (H)  70 - 99 mg/dL Final   Glucose reference range applies only to samples taken after fasting for at least 8 hours.  Admission on 01/20/2022, Discharged on 01/21/2022  Component Date Value Ref Range Status   SARS Coronavirus 2 by RT PCR 01/20/2022 NEGATIVE  NEGATIVE Final   Comment: (NOTE) SARS-CoV-2 target nucleic acids are NOT DETECTED.  The SARS-CoV-2 RNA is generally detectable in upper respiratory specimens during the acute phase of infection. The lowest concentration of SARS-CoV-2 viral copies this assay can detect is 138 copies/mL. A negative result does not preclude SARS-Cov-2 infection and should not be used as the sole basis for treatment or other patient management decisions. A negative result may occur with  improper specimen collection/handling, submission of specimen other than nasopharyngeal swab, presence of viral mutation(s) within the areas targeted by  this assay, and inadequate number of viral copies(<138 copies/mL). A negative result must be combined with clinical observations, patient history, and epidemiological information. The expected result is Negative.  Fact Sheet for Patients:  EntrepreneurPulse.com.au  Fact Sheet for Healthcare Providers:  IncredibleEmployment.be  This test is no                          t yet approved or cleared by the [place] FDA and  has been authorized for detection and/or diagnosis of SARS-CoV-2 by FDA under an Emergency Use Authorization (EUA). This EUA will remain  in effect (meaning this test can be used) for the duration of the COVID-19 declaration under Section 564(b)(1) of the Act, 21 U.S.C.section 360bbb-3(b)(1), unless the authorization is terminated  or revoked sooner.       Influenza A by PCR 01/20/2022 NEGATIVE  NEGATIVE Final   Influenza B by PCR 01/20/2022 NEGATIVE  NEGATIVE Final   Comment: (NOTE) The Xpert Xpress SARS-CoV-2/FLU/RSV plus assay is intended as an aid in the diagnosis of influenza from Nasopharyngeal swab specimens and should not be used as a sole basis for treatment. Nasal washings and aspirates are unacceptable for Xpert Xpress SARS-CoV-2/FLU/RSV testing.  Fact Sheet for Patients: EntrepreneurPulse.com.au  Fact Sheet for Healthcare Providers: IncredibleEmployment.be  This test is not yet approved or cleared by the [place] FDA and has been authorized for detection and/or diagnosis of SARS-CoV-2 by FDA under an Emergency Use Authorization (EUA). This EUA will remain in effect (meaning this test can be used) for the duration of the COVID-19 declaration under Section 564(b)(1) of the Act, 21 U.S.C. section 360bbb-3(b)(1), unless the authorization is terminated or revoked.  Performed at [facility], [place] [address], [place], [place] [address]    Sodium 01/20/2022 136  135 -  145 mmol/L Final   Potassium 01/20/2022 4.0  3.5 -  5.1 mmol/L Final   Chloride 01/20/2022 103  98 - 111 mmol/L Final   CO2 01/20/2022 24  22 - 32 mmol/L Final   Glucose, Bld 01/20/2022 360 (H)  70 - 99 mg/dL Final   Glucose reference range applies only to samples taken after fasting for at least 8 hours.   BUN 01/20/2022 12  8 - 23 mg/dL Final   Creatinine, Ser 01/20/2022 1.01  0.61 - 1.24 mg/dL Final   Calcium 01/20/2022 9.4  8.9 - 10.3 mg/dL Final   Total Protein 01/20/2022 7.3  6.5 - 8.1 g/dL Final   Albumin 01/20/2022 3.4 (L)  3.5 - 5.0 g/dL Final   AST 01/20/2022 14 (L)  15 - 41 U/L Final   ALT 01/20/2022 10  0 - 44 U/L Final   Alkaline Phosphatase 01/20/2022 101  38 - 126 U/L Final   Total Bilirubin 01/20/2022 0.3  0.3 - 1.2 mg/dL Final   GFR, Estimated 01/20/2022 >60  >60 mL/min Final   Comment: (NOTE) Calculated using the CKD-EPI Creatinine Equation (2021)    Anion gap 01/20/2022 9  5 - 15 Final   Performed at [facility] [address], [place], [place] [address]   Alcohol, Ethyl (B) 01/20/2022 <10  <10 mg/dL Final   Comment: (NOTE) Lowest detectable limit for serum alcohol is 10 mg/dL.  For medical purposes only. Performed at [facility], [place] [address] [place], [place] [address]    Opiates 01/20/2022 NONE DETECTED  NONE DETECTED Final   Cocaine 01/20/2022 POSITIVE (A)  NONE DETECTED Final   Benzodiazepines 01/20/2022 NONE DETECTED  NONE DETECTED Final   Amphetamines 01/20/2022 NONE DETECTED  NONE DETECTED Final   Tetrahydrocannabinol 01/20/2022 NONE DETECTED  NONE DETECTED Final   Barbiturates 01/20/2022 NONE DETECTED  NONE DETECTED Final   Comment: (NOTE) DRUG SCREEN FOR MEDICAL PURPOSES ONLY.  IF CONFIRMATION IS NEEDED FOR ANY PURPOSE, NOTIFY LAB WITHIN 5 DAYS.  LOWEST DETECTABLE LIMITS FOR URINE DRUG SCREEN Drug Class                     Cutoff (ng/mL) Amphetamine and metabolites    1000 Barbiturate and metabolites    200 Benzodiazepine                  200 Opiates and metabolites        300 Cocaine and metabolites        300 THC                            50 Performed at [facility], [place] [address] [place], [place] [address]    WBC 01/20/2022 5.2  4.0 - 10.5 K/uL Final   RBC 01/20/2022 4.73  4.22 - 5.81 MIL/uL Final   Hemoglobin 01/20/2022 14.3  13.0 - 17.0 g/dL Final   HCT 01/20/2022 43.1  39.0 - 52.0 % Final   MCV 01/20/2022 91.1  80.0 - 100.0 fL Final   MCH 01/20/2022 30.2  26.0 - 34.0 pg Final   MCHC 01/20/2022 33.2  30.0 - 36.0 g/dL Final   RDW 01/20/2022 14.1  11.5 - 15.5 % Final   Platelets 01/20/2022 444 (H)  150 - 400 K/uL Final   nRBC 01/20/2022 0.0  0.0 - 0.2 % Final   Neutrophils Relative % 01/20/2022 61  % Final   Neutro Abs 01/20/2022 3.2  1.7 - 7.7 K/uL Final   Lymphocytes  Relative 01/20/2022 29  % Final   Lymphs Abs 01/20/2022 1.5  0.7 - 4.0 K/uL Final   Monocytes Relative 01/20/2022 7  % Final   Monocytes Absolute 01/20/2022 0.4  0.1 - 1.0 K/uL Final   Eosinophils Relative 01/20/2022 2  % Final   Eosinophils Absolute 01/20/2022 0.1  0.0 - 0.5 K/uL Final   Basophils Relative 01/20/2022 1  % Final   Basophils Absolute 01/20/2022 0.0  0.0 - 0.1 K/uL Final   Immature Granulocytes 01/20/2022 0  % Final   Abs Immature Granulocytes 01/20/2022 0.01  0.00 - 0.07 K/uL Final   Performed at [facility] [address], [place], [place] [address]   Salicylate Lvl 78/67/6720 <7.0 (L)  7.0 - 30.0 mg/dL Final   Performed at [facility] [address], [place], [place] [address]   Acetaminophen (Tylenol), Serum 01/20/2022 <10 (L)  10 - 30 ug/mL Final   Comment: (NOTE) Therapeutic concentrations vary significantly. A range of 10-30 ug/mL  may be an effective concentration for many patients. However, some  are best treated at concentrations outside of this range. Acetaminophen concentrations >150 ug/mL at 4 hours after ingestion  and >50 ug/mL at 12 hours after ingestion are often associated  with  toxic reactions.  Performed at [facility], [place] [address] [place], [place] [address]    Glucose-Capillary 01/20/2022 344 (H)  70 - 99 mg/dL Final   Glucose reference range applies only to samples taken after fasting for at least 8 hours.   Comment 1 01/20/2022 Notify RN   Final   Comment 2 01/20/2022 Document in Chart   Final   Glucose-Capillary 01/20/2022 479 (H)  70 - 99 mg/dL Final   Glucose reference range applies only to samples taken after fasting for at least 8 hours.   Glucose-Capillary 01/21/2022 243 (H)  70 - 99 mg/dL Final   Glucose reference range applies only to samples taken after fasting for at least 8 hours.   Glucose-Capillary 01/21/2022 224 (H)  70 - 99 mg/dL Final   Glucose reference range applies only to samples taken after fasting for at least 8 hours.   Glucose-Capillary 01/21/2022 282 (H)  70 - 99 mg/dL Final   Glucose reference range applies only to samples taken after fasting for at least 8 hours.  Admission on 01/18/2022, Discharged on 01/19/2022  Component Date Value Ref Range Status   WBC 01/18/2022 5.3  4.0 - 10.5 K/uL Final   RBC 01/18/2022 4.47  4.22 - 5.81 MIL/uL Final   Hemoglobin 01/18/2022 13.7  13.0 - 17.0 g/dL Final   HCT 01/18/2022 41.0  39.0 - 52.0 % Final   MCV 01/18/2022 91.7  80.0 - 100.0 fL Final   MCH 01/18/2022 30.6  26.0 - 34.0 pg Final   MCHC 01/18/2022 33.4  30.0 - 36.0 g/dL Final   RDW 01/18/2022 14.2  11.5 - 15.5 % Final   Platelets 01/18/2022 445 (H)  150 - 400 K/uL Final   nRBC 01/18/2022 0.0  0.0 - 0.2 % Final   Performed at [facility], [place] [address], [place], [place] [address]   Sodium 01/18/2022 136  135 - 145 mmol/L Final   Potassium 01/18/2022 4.3  3.5 - 5.1 mmol/L Final   Chloride 01/18/2022 100  98 - 111 mmol/L Final   CO2 01/18/2022 22  22 - 32 mmol/L Final   Glucose, Bld 01/18/2022 499 (H)  70 - 99 mg/dL Final  Glucose reference range applies only to samples taken after fasting for at least 8  hours.   BUN 01/18/2022 9  8 - 23 mg/dL Final   Creatinine, Ser 01/18/2022 1.23  0.61 - 1.24 mg/dL Final   Calcium 01/18/2022 9.1  8.9 - 10.3 mg/dL Final   Total Protein 01/18/2022 6.6  6.5 - 8.1 g/dL Final   Albumin 01/18/2022 3.2 (L)  3.5 - 5.0 g/dL Final   AST 01/18/2022 18  15 - 41 U/L Final   ALT 01/18/2022 9  0 - 44 U/L Final   Alkaline Phosphatase 01/18/2022 95  38 - 126 U/L Final   Total Bilirubin 01/18/2022 0.3  0.3 - 1.2 mg/dL Final   GFR, Estimated 01/18/2022 >60  >60 mL/min Final   Comment: (NOTE) Calculated using the CKD-EPI Creatinine Equation (2021)    Anion gap 01/18/2022 14  5 - 15 Final   Performed at [facility] [address], [place], [place] [address]   Color, Urine 01/18/2022 YELLOW  YELLOW Final   APPearance 01/18/2022 CLEAR  CLEAR Final   Specific Gravity, Urine 01/18/2022 1.025  1.005 - 1.030 Final   pH 01/18/2022 6.0  5.0 - 8.0 Final   Glucose, UA 01/18/2022 >=500 (A)  NEGATIVE mg/dL Final   Hgb urine dipstick 01/18/2022 NEGATIVE  NEGATIVE Final   Bilirubin Urine 01/18/2022 NEGATIVE  NEGATIVE Final   Ketones, ur 01/18/2022 NEGATIVE  NEGATIVE mg/dL Final   Protein, ur 01/18/2022 NEGATIVE  NEGATIVE mg/dL Final   Nitrite 01/18/2022 NEGATIVE  NEGATIVE Final   Leukocytes,Ua 01/18/2022 SMALL (A)  NEGATIVE Final   RBC / HPF 01/18/2022 0-5  0 - 5 RBC/hpf Final   WBC, UA 01/18/2022 21-50  0 - 5 WBC/hpf Final   Bacteria, UA 01/18/2022 RARE (A)  NONE SEEN Final   Squamous Epithelial / LPF 01/18/2022 0-5  0 - 5 Final   Budding Yeast 01/18/2022 PRESENT   Final   Performed at [facility], [place] [address] [place], [place] [address]   Opiates 01/18/2022 NONE DETECTED  NONE DETECTED Final   Cocaine 01/18/2022 POSITIVE (A)  NONE DETECTED Final   Benzodiazepines 01/18/2022 NONE DETECTED  NONE DETECTED Final   Amphetamines 01/18/2022 NONE DETECTED  NONE DETECTED Final   Tetrahydrocannabinol 01/18/2022 NONE DETECTED  NONE DETECTED Final   Barbiturates  01/18/2022 NONE DETECTED  NONE DETECTED Final   Comment: (NOTE) DRUG SCREEN FOR MEDICAL PURPOSES ONLY.  IF CONFIRMATION IS NEEDED FOR ANY PURPOSE, NOTIFY LAB WITHIN 5 DAYS.  LOWEST DETECTABLE LIMITS FOR URINE DRUG SCREEN Drug Class                     Cutoff (ng/mL) Amphetamine and metabolites    1000 Barbiturate and metabolites    200 Benzodiazepine                 200 Opiates and metabolites        300 Cocaine and metabolites        300 THC                            50 Performed at [facility], [place] [address], [place], [place] [address]    Glucose-Capillary 01/18/2022 483 (H)  70 - 99 mg/dL Final   Glucose reference range applies only to samples taken after fasting for at least 8 hours.   Glucose-Capillary 01/19/2022 586 (HH)  70 - 99 mg/dL  Final   Glucose reference range applies only to samples taken after fasting for at least 8 hours.   Glucose-Capillary 01/19/2022 456 (H)  70 - 99 mg/dL Final   Glucose reference range applies only to samples taken after fasting for at least 8 hours.   Glucose-Capillary 01/19/2022 515 (HH)  70 - 99 mg/dL Final   Glucose reference range applies only to samples taken after fasting for at least 8 hours.   Comment 1 01/19/2022 Notify RN   Final   Glucose-Capillary 01/19/2022 164 (H)  70 - 99 mg/dL Final   Glucose reference range applies only to samples taken after fasting for at least 8 hours.  Admission on 01/07/2022, Discharged on 01/07/2022  Component Date Value Ref Range Status   Glucose-Capillary 01/07/2022 267 (H)  70 - 99 mg/dL Final   Glucose reference range applies only to samples taken after fasting for at least 8 hours.  Admission on 01/02/2022, Discharged on 01/02/2022  Component Date Value Ref Range Status   WBC 01/02/2022 7.0  4.0 - 10.5 K/uL Final   RBC 01/02/2022 4.54  4.22 - 5.81 MIL/uL Final   Hemoglobin 01/02/2022 13.9  13.0 - 17.0 g/dL Final   HCT 01/02/2022 41.4  39.0 - 52.0 % Final   MCV 01/02/2022 91.2  80.0 -  100.0 fL Final   MCH 01/02/2022 30.6  26.0 - 34.0 pg Final   MCHC 01/02/2022 33.6  30.0 - 36.0 g/dL Final   RDW 01/02/2022 14.1  11.5 - 15.5 % Final   Platelets 01/02/2022 369  150 - 400 K/uL Final   nRBC 01/02/2022 0.0  0.0 - 0.2 % Final   Neutrophils Relative % 01/02/2022 59  % Final   Neutro Abs 01/02/2022 4.1  1.7 - 7.7 K/uL Final   Lymphocytes Relative 01/02/2022 28  % Final   Lymphs Abs 01/02/2022 1.9  0.7 - 4.0 K/uL Final   Monocytes Relative 01/02/2022 8  % Final   Monocytes Absolute 01/02/2022 0.5  0.1 - 1.0 K/uL Final   Eosinophils Relative 01/02/2022 4  % Final   Eosinophils Absolute 01/02/2022 0.3  0.0 - 0.5 K/uL Final   Basophils Relative 01/02/2022 1  % Final   Basophils Absolute 01/02/2022 0.1  0.0 - 0.1 K/uL Final   Immature Granulocytes 01/02/2022 0  % Final   Abs Immature Granulocytes 01/02/2022 0.02  0.00 - 0.07 K/uL Final   Performed at [facility], [place] [address], [place], [place] [address]   Sodium 01/02/2022 138  135 - 145 mmol/L Final   Potassium 01/02/2022 4.1  3.5 - 5.1 mmol/L Final   Chloride 01/02/2022 104  98 - 111 mmol/L Final   CO2 01/02/2022 25  22 - 32 mmol/L Final   Glucose, Bld 01/02/2022 216 (H)  70 - 99 mg/dL Final   Glucose reference range applies only to samples taken after fasting for at least 8 hours.   BUN 01/02/2022 11  8 - 23 mg/dL Final   Creatinine, Ser 01/02/2022 0.98  0.61 - 1.24 mg/dL Final   Calcium 01/02/2022 9.5  8.9 - 10.3 mg/dL Final   Total Protein 01/02/2022 7.4  6.5 - 8.1 g/dL Final   Albumin 01/02/2022 3.4 (L)  3.5 - 5.0 g/dL Final   AST 01/02/2022 12 (L)  15 - 41 U/L Final   ALT 01/02/2022 11  0 - 44 U/L Final   Alkaline Phosphatase 01/02/2022 90  38 - 126 U/L Final   Total Bilirubin 01/02/2022 0.9  0.3 - 1.2 mg/dL Final   GFR, Estimated 01/02/2022 >60  >60 mL/min Final   Comment: (NOTE) Calculated using the CKD-EPI Creatinine Equation (2021)    Anion gap 01/02/2022 9  5 - 15 Final   Performed at [facility], [place] [address] [place] [address], [place], [place] [address]  [facility] on 11/01/2021  Component Date Value Ref Range Status   POC Glucose 11/27/2021 301 (A)  70 - 99 mg/dl Final   1 hr after lunch, had not taken insulin  Admission on 10/13/2021, Discharged on 10/15/2021  Component Date Value Ref Range Status   Glucose-Capillary 10/13/2021 388 (H)  70 - 99 mg/dL Final   Glucose reference range applies only to samples taken after fasting for at least 8 hours.   SARS Coronavirus 2 by RT PCR 10/13/2021 NEGATIVE  NEGATIVE Final   Comment: (NOTE) SARS-CoV-2 target nucleic acids are NOT DETECTED.  The SARS-CoV-2 RNA is generally detectable in upper respiratory specimens during the acute phase of infection. The lowest concentration of SARS-CoV-2 viral copies this assay can detect is 138 copies/mL. A negative result does not preclude SARS-Cov-2 infection and should not be used as the sole basis for treatment or other patient management decisions. A negative result may occur with  improper specimen collection/handling, submission of specimen other than nasopharyngeal swab, presence of viral mutation(s) within the areas targeted by this assay, and inadequate number of viral copies(<138 copies/mL). A negative result must be combined with clinical observations, patient history, and epidemiological information. The expected result is Negative.  Fact Sheet for Patients:  EntrepreneurPulse.com.au  Fact Sheet for Healthcare Providers:  IncredibleEmployment.be  This test is no                          t yet approved or cleared by the [place] FDA and  has been authorized for detection and/or diagnosis of SARS-CoV-2 by FDA under an Emergency Use Authorization (EUA). This EUA will remain  in effect (meaning this test can be used) for the duration of the COVID-19 declaration under Section 564(b)(1) of the Act, 21 U.S.C.section 360bbb-3(b)(1), unless  the authorization is terminated  or revoked sooner.       Influenza A by PCR 10/13/2021 NEGATIVE  NEGATIVE Final   Influenza B by PCR 10/13/2021 NEGATIVE  NEGATIVE Final   Comment: (NOTE) The Xpert Xpress SARS-CoV-2/FLU/RSV plus assay is intended as an aid in the diagnosis of influenza from Nasopharyngeal swab specimens and should not be used as a sole basis for treatment. Nasal washings and aspirates are unacceptable for Xpert Xpress SARS-CoV-2/FLU/RSV testing.  Fact Sheet for Patients: EntrepreneurPulse.com.au  Fact Sheet for Healthcare Providers: IncredibleEmployment.be  This test is not yet approved or cleared by the [place] FDA and has been authorized for detection and/or diagnosis of SARS-CoV-2 by FDA under an Emergency Use Authorization (EUA). This EUA will remain in effect (meaning this test can be used) for the duration of the COVID-19 declaration under Section 564(b)(1) of the Act, 21 U.S.C. section 360bbb-3(b)(1), unless the authorization is terminated or revoked.  Performed at [facility], [place] [address] [place], [place] [address]    WBC 10/13/2021 9.5  4.0 - 10.5 K/uL Final   RBC 10/13/2021 4.23  4.22 - 5.81 MIL/uL Final   Hemoglobin 10/13/2021 13.0  13.0 - 17.0 g/dL Final   HCT 10/13/2021 40.4  39.0 - 52.0 % Final   MCV 10/13/2021 95.5  80.0 - 100.0  fL Final   MCH 10/13/2021 30.7  26.0 - 34.0 pg Final   MCHC 10/13/2021 32.2  30.0 - 36.0 g/dL Final   RDW 10/13/2021 13.4  11.5 - 15.5 % Final   Platelets 10/13/2021 551 (H)  150 - 400 K/uL Final   nRBC 10/13/2021 0.0  0.0 - 0.2 % Final   Neutrophils Relative % 10/13/2021 68  % Final   Neutro Abs 10/13/2021 6.5  1.7 - 7.7 K/uL Final   Lymphocytes Relative 10/13/2021 18  % Final   Lymphs Abs 10/13/2021 1.7  0.7 - 4.0 K/uL Final   Monocytes Relative 10/13/2021 9  % Final   Monocytes Absolute 10/13/2021 0.8  0.1 - 1.0 K/uL Final   Eosinophils Relative 10/13/2021 3   % Final   Eosinophils Absolute 10/13/2021 0.3  0.0 - 0.5 K/uL Final   Basophils Relative 10/13/2021 1  % Final   Basophils Absolute 10/13/2021 0.1  0.0 - 0.1 K/uL Final   Immature Granulocytes 10/13/2021 1  % Final   Abs Immature Granulocytes 10/13/2021 0.11 (H)  0.00 - 0.07 K/uL Final   Performed at [facility], [place] [address] [place], [place] [address]   Sodium 10/13/2021 136  135 - 145 mmol/L Final   Potassium 10/13/2021 4.3  3.5 - 5.1 mmol/L Final   Chloride 10/13/2021 104  98 - 111 mmol/L Final   CO2 10/13/2021 24  22 - 32 mmol/L Final   Glucose, Bld 10/13/2021 296 (H)  70 - 99 mg/dL Final   Glucose reference range applies only to samples taken after fasting for at least 8 hours.   BUN 10/13/2021 14  8 - 23 mg/dL Final   Creatinine, Ser 10/13/2021 1.15  0.61 - 1.24 mg/dL Final   Calcium 10/13/2021 9.0  8.9 - 10.3 mg/dL Final   Total Protein 10/13/2021 7.5  6.5 - 8.1 g/dL Final   Albumin 10/13/2021 3.0 (L)  3.5 - 5.0 g/dL Final   AST 10/13/2021 13 (L)  15 - 41 U/L Final   ALT 10/13/2021 10  0 - 44 U/L Final   Alkaline Phosphatase 10/13/2021 84  38 - 126 U/L Final   Total Bilirubin 10/13/2021 0.5  0.3 - 1.2 mg/dL Final   GFR, Estimated 10/13/2021 >60  >60 mL/min Final   Comment: (NOTE) Calculated using the CKD-EPI Creatinine Equation (2021)    Anion gap 10/13/2021 8  5 - 15 Final   Performed at [facility], [place] [address], [place], [place] [address]   Color, Urine 10/13/2021 YELLOW  YELLOW Final   APPearance 10/13/2021 CLOUDY (A)  CLEAR Final   Specific Gravity, Urine 10/13/2021 1.022  1.005 - 1.030 Final   pH 10/13/2021 5.0  5.0 - 8.0 Final   Glucose, UA 10/13/2021 >=500 (A)  NEGATIVE mg/dL Final   Hgb urine dipstick 10/13/2021 LARGE (A)  NEGATIVE Final   Bilirubin Urine 10/13/2021 NEGATIVE  NEGATIVE Final   Ketones, ur 10/13/2021 NEGATIVE  NEGATIVE mg/dL Final   Protein, ur 10/13/2021 30 (A)  NEGATIVE mg/dL Final   Nitrite 10/13/2021 NEGATIVE  NEGATIVE Final    Leukocytes,Ua 10/13/2021 LARGE (A)  NEGATIVE Final   RBC / HPF 10/13/2021 >50 (H)  0 - 5 RBC/hpf Final   WBC, UA 10/13/2021 >50 (H)  0 - 5 WBC/hpf Final   Bacteria, UA 10/13/2021 RARE (A)  NONE SEEN Final   Squamous Epithelial / LPF 10/13/2021 0-5  0 - 5 Final   WBC Clumps 10/13/2021 PRESENT   Final  Mucus 10/13/2021 PRESENT   Final   Budding Yeast 10/13/2021 PRESENT   Final   Non Squamous Epithelial 10/13/2021 0-5 (A)  NONE SEEN Final   Performed at [facility], [place] [address], [place], [place] [address]   pH, Ven 10/13/2021 7.457 (H)  7.25 - 7.43 Final   pCO2, Ven 10/13/2021 38.1 (L)  44 - 60 mmHg Final   pO2, Ven 10/13/2021 174 (H)  32 - 45 mmHg Final   Bicarbonate 10/13/2021 26.9  20.0 - 28.0 mmol/L Final   TCO2 10/13/2021 28  22 - 32 mmol/L Final   O2 Saturation 10/13/2021 100  % Final   Acid-Base Excess 10/13/2021 3.0 (H)  0.0 - 2.0 mmol/L Final   Sodium 10/13/2021 137  135 - 145 mmol/L Final   Potassium 10/13/2021 4.3  3.5 - 5.1 mmol/L Final   Calcium, Ion 10/13/2021 1.10 (L)  1.15 - 1.40 mmol/L Final   HCT 10/13/2021 40.0  39.0 - 52.0 % Final   Hemoglobin 10/13/2021 13.6  13.0 - 17.0 g/dL Final   Sample type 10/13/2021 VENOUS   Final   Opiates 10/13/2021 NONE DETECTED  NONE DETECTED Final   Cocaine 10/13/2021 POSITIVE (A)  NONE DETECTED Final   Benzodiazepines 10/13/2021 NONE DETECTED  NONE DETECTED Final   Amphetamines 10/13/2021 NONE DETECTED  NONE DETECTED Final   Tetrahydrocannabinol 10/13/2021 NONE DETECTED  NONE DETECTED Final   Barbiturates 10/13/2021 NONE DETECTED  NONE DETECTED Final   Comment: (NOTE) DRUG SCREEN FOR MEDICAL PURPOSES ONLY.  IF CONFIRMATION IS NEEDED FOR ANY PURPOSE, NOTIFY LAB WITHIN 5 DAYS.  LOWEST DETECTABLE LIMITS FOR URINE DRUG SCREEN Drug Class                     Cutoff (ng/mL) Amphetamine and metabolites    1000 Barbiturate and metabolites    200 Benzodiazepine                 732 Tricyclics and metabolites     300 Opiates  and metabolites        300 Cocaine and metabolites        300 THC                            50 Performed at [facility], [place] [address], [place], [place] [address]    Adenovirus 10/13/2021 NOT DETECTED  NOT DETECTED Final   Coronavirus 229E 10/13/2021 NOT DETECTED  NOT DETECTED Final   Comment: (NOTE) The Coronavirus on the Respiratory Panel, DOES NOT test for the novel  Coronavirus (2019 nCoV)    Coronavirus HKU1 10/13/2021 NOT DETECTED  NOT DETECTED Final   Coronavirus NL63 10/13/2021 NOT DETECTED  NOT DETECTED Final   Coronavirus OC43 10/13/2021 NOT DETECTED  NOT DETECTED Final   Metapneumovirus 10/13/2021 NOT DETECTED  NOT DETECTED Final   Rhinovirus / Enterovirus 10/13/2021 NOT DETECTED  NOT DETECTED Final   Influenza A 10/13/2021 NOT DETECTED  NOT DETECTED Final   Influenza B 10/13/2021 NOT DETECTED  NOT DETECTED Final   Parainfluenza Virus 1 10/13/2021 NOT DETECTED  NOT DETECTED Final   Parainfluenza Virus 2 10/13/2021 NOT DETECTED  NOT DETECTED Final   Parainfluenza Virus 3 10/13/2021 NOT DETECTED  NOT DETECTED Final   Parainfluenza Virus 4 10/13/2021 NOT DETECTED  NOT DETECTED Final   Respiratory Syncytial Virus 10/13/2021 NOT DETECTED  NOT DETECTED Final   Bordetella pertussis 10/13/2021 NOT DETECTED  NOT DETECTED  Final   Bordetella Parapertussis 10/13/2021 NOT DETECTED  NOT DETECTED Final   Chlamydophila pneumoniae 10/13/2021 NOT DETECTED  NOT DETECTED Final   Mycoplasma pneumoniae 10/13/2021 NOT DETECTED  NOT DETECTED Final   Performed at [facility], [place] [address] [place], [place] [address]   Procalcitonin 10/13/2021 <0.10  ng/mL Final   Comment:        Interpretation: PCT (Procalcitonin) <= 0.5 ng/mL: Systemic infection (sepsis) is not likely. Local bacterial infection is possible. (NOTE)       Sepsis PCT Algorithm           Lower Respiratory Tract                                      Infection PCT Algorithm    ----------------------------      ----------------------------         PCT < 0.25 ng/mL                PCT < 0.10 ng/mL          Strongly encourage             Strongly discourage   discontinuation of antibiotics    initiation of antibiotics    ----------------------------     -----------------------------       PCT 0.25 - 0.50 ng/mL            PCT 0.10 - 0.25 ng/mL               OR       >80% decrease in PCT            Discourage initiation of                                            antibiotics      Encourage discontinuation           of antibiotics    ----------------------------     -----------------------------         PCT >= 0.50 ng/mL              PCT 0.26 - 0.50 ng/mL               AND                                 <80% decrease in PCT             Encourage initiation of                                             antibiotics       Encourage continuation           of antibiotics    ----------------------------     -----------------------------        PCT >= 0.50 ng/mL                  PCT > 0.50 ng/mL               AND         increase in PCT  Strongly encourage                                      initiation of antibiotics    Strongly encourage escalation           of antibiotics                                     -----------------------------                                           PCT <= 0.25 ng/mL                                                 OR                                        > 80% decrease in PCT                                      Discontinue / Do not initiate                                             antibiotics  Performed at [facility], [place] [address], [place], [place] [address]    B Natriuretic Peptide 10/13/2021 23.6  0.0 - 100.0 pg/mL Final   Performed at [facility] [address], [place], [place] [address]   L. pneumophila Serogp 1 Ur Ag 10/13/2021 Negative  Negative Final   Comment: (NOTE) Presumptive negative for L. pneumophila  serogroup 1 antigen in urine, suggesting no recent or current infection. Legionnaires' disease cannot be ruled out since other serogroups and species may also cause disease. Performed At: [facility] [place], [place] [id number] Rush Farmer MD [id number]    Source of Sample 10/13/2021 URINE, RANDOM   Final   Performed at [facility], [place] [address], [place], [place] [address]   Strep Pneumo Urinary Antigen 10/13/2021 NEGATIVE  NEGATIVE Final   Comment:        Infection due to S. pneumoniae cannot be absolutely ruled out since the antigen present may be below the detection limit of the test. Performed at [facility], [address], [place], [place] [address]    Sodium 10/14/2021 138  135 - 145 mmol/L Final   Potassium 10/14/2021 4.3  3.5 - 5.1 mmol/L Final   Chloride 10/14/2021 106  98 - 111 mmol/L Final   CO2 10/14/2021 25  22 - 32 mmol/L Final   Glucose, Bld 10/14/2021 286 (H)  70 - 99 mg/dL Final   Glucose reference range applies only to samples taken after fasting for at least 8 hours.   BUN 10/14/2021 14  8 - 23 mg/dL Final  Creatinine, Ser 10/14/2021 0.98  0.61 - 1.24 mg/dL Final   Calcium 10/14/2021 8.9  8.9 - 10.3 mg/dL Final   GFR, Estimated 10/14/2021 >60  >60 mL/min Final   Comment: (NOTE) Calculated using the CKD-EPI Creatinine Equation (2021)    Anion gap 10/14/2021 7  5 - 15 Final   Performed at [facility] [address] [place], [place] [address]   WBC 10/14/2021 9.6  4.0 - 10.5 K/uL Final   RBC 10/14/2021 4.12 (L)  4.22 - 5.81 MIL/uL Final   Hemoglobin 10/14/2021 12.6 (L)  13.0 - 17.0 g/dL Final   HCT 10/14/2021 38.8 (L)  39.0 - 52.0 % Final   MCV 10/14/2021 94.2  80.0 - 100.0 fL Final   MCH 10/14/2021 30.6  26.0 - 34.0 pg Final   MCHC 10/14/2021 32.5  30.0 - 36.0 g/dL Final   RDW 10/14/2021 13.4  11.5 - 15.5 % Final   Platelets 10/14/2021 556 (H)  150 - 400 K/uL Final   nRBC 10/14/2021 0.0  0.0 - 0.2 %  Final   Performed at [facility], [place] [address] [place], [place] [address]   Glucose-Capillary 10/13/2021 281 (H)  70 - 99 mg/dL Final   Glucose reference range applies only to samples taken after fasting for at least 8 hours.   Glucose-Capillary 10/13/2021 399 (H)  70 - 99 mg/dL Final   Glucose reference range applies only to samples taken after fasting for at least 8 hours.   Glucose-Capillary 10/14/2021 239 (H)  70 - 99 mg/dL Final   Glucose reference range applies only to samples taken after fasting for at least 8 hours.   Glucose-Capillary 10/14/2021 345 (H)  70 - 99 mg/dL Final   Glucose reference range applies only to samples taken after fasting for at least 8 hours.   Glucose-Capillary 10/14/2021 383 (H)  70 - 99 mg/dL Final   Glucose reference range applies only to samples taken after fasting for at least 8 hours.   Glucose-Capillary 10/15/2021 194 (H)  70 - 99 mg/dL Final   Glucose reference range applies only to samples taken after fasting for at least 8 hours.   Glucose-Capillary 10/15/2021 161 (H)  70 - 99 mg/dL Final   Glucose reference range applies only to samples taken after fasting for at least 8 hours.   Glucose-Capillary 10/15/2021 312 (H)  70 - 99 mg/dL Final   Glucose reference range applies only to samples taken after fasting for at least 8 hours.   Glucose-Capillary 10/14/2021 279 (H)  70 - 99 mg/dL Final   Glucose reference range applies only to samples taken after fasting for at least 8 hours.  There may be more visits with results that are not included.    Blood Alcohol level:  Lab Results  Component Value Date   ETH <10 03/02/2022   ETH <10 73/22/0254    Metabolic Disorder Labs: Lab Results  Component Value Date   HGBA1C 10.4 (H) 01/21/2022   MPG 251.78 01/21/2022   MPG >398 02/24/2021   No results found for: "PROLACTIN" Lab Results  Component Value Date   CHOL 213 (H) 05/23/2021   TRIG 115 05/23/2021   HDL 71 05/23/2021   CHOLHDL 3.2  11/03/2019   VLDL 32 (H) 05/03/2015   LDLCALC 122 (H) 05/23/2021   LDLCALC 99 11/03/2019    Therapeutic Lab Levels: No results found for: "LITHIUM" No results found for: "VALPROATE" No results found for: "CBMZ"  Physical Findings  [facility] Office Visit from 10/08/2021 in [facility] Office Visit from 09/05/2021 in [facility] Office Visit from 12/26/2020 in [facility] Office Visit from 11/17/2018 in [facility] Office Visit from 06/05/2017 in [facility]  Total GAD-7 Score _0 0 12      PHQ2-9    Flowsheet Row ED from 03/02/2022 in [facility] ED from 01/21/2022 in [facility] Office Visit from 10/08/2021 in [facility] Office Visit from 09/05/2021 in [facility] ED from 07/02/2021 in [facility]  PHQ-2 Total Score 1 0 _1 PHQ-9 Total Score 2 -- _2 Flowsheet Row ED from 03/02/2022 in [facility] Most recent reading at 03/03/2022  1:19 AM ED from 03/01/2022 in [facility] Most recent reading at 03/01/2022  5:43 PM ED from 03/01/2022 in [facility] Most recent reading at 03/01/2022 12:10 AM  C-SSRS RISK CATEGORY No Risk No Risk No Risk        Musculoskeletal  Strength & Muscle Tone: within normal limits Gait & Station: normal Patient leans: N/A   Mental Status Exam   Appearance and Grooming: Patient is casually dressed. The patient has no noticeable scent or odor. Motor activity: The patient's movement speed was normal; his gait was normal. There was no notable abnormal facial movements and no notable abnormal extremity movements. Behavior: The patient appears in no acute distress, and  during the interview, was calm, focused, required minimal redirection, and behaving appropriately to scenario; he was able to follow commands and compliant to requests and made good eye contact. The patient did not appear internally or externally preoccupied. Attitude: Patient's attitude towards the interviewer was cooperative and guarded. Speech: The patient's speech was clear, fluent, with good articulation, and with appropriately placed inflections. The volume of his speech was normal and normal in quantity. The rate was normal with a normal rhythm. Responses were normal in latency. There were no abnormal patterns in speech. Mood: "Beautiful" Affect: Patient's affect is euthymic with broad range and even fluctuations; his affect is congruent with his stated mood. ------------------------------------------------------------------------------------------------------------------------- Thought Content The patient experiences no hallucinations. The patient describes no delusional thoughts; he denies thought insertion, denies thought withdrawal, denies thought interruption, and denies thought broadcasting. Patient at the time of interview denies active suicidal intent and denies passive suicidal ideation; he denies homicidal intent. Thought Process The patient's thought process is linear and is goal-directed. Insight The patient at the time of interview demonstrates fair insight Judgement The patient over the past 24 hours demonstrates fair judgement  Memory: poor  Executive Functions  Concentration: intact Attention Span: Fair Recall: intact Fund of Knowledge: fair  Alertness/Orientation: alert and answering questions appropriately  Psychomotor Activity  Psychomotor Activity: Psychomotor Activity: Normal   Assets  Assets: Armed forces logistics/support/administrative officer; Desire for Improvement; Resilience   Sleep  Sleep: Fair   Physical Exam  Constitutional:      Appearance: the patient is not  toxic-appearing.  Pulmonary:     Effort: Pulmonary effort is normal.  Neurological:     General: No focal deficit present.     Mental Status: the patient is alert and answering questions appropriately Review of Systems  Respiratory:  Negative  for shortness of breath.   Cardiovascular:  Negative for chest pain.  Gastrointestinal:  Negative for abdominal pain, constipation, diarrhea, nausea and vomiting.  Neurological:  Negative for headaches.   Blood pressure 113/82, pulse 79, temperature 98.5 F (36.9 C), temperature source Oral, resp. rate 18, SpO2 100 %. There is no height or weight on file to calculate BMI.  Treatment Plan Summary: Daily contact with patient to assess and evaluate symptoms and progress in treatment and Medication management  Nilan Iddings is a 61 yr old male who presented to [facility] on 12/22 requesting Detox from Crack Cocaine, he was admitted to [facility] on 12/24.  PPHx is significant for Substance Induced Mood Disorder, Cocaine Use Disorder, tobacco use disorder, and no Psychiatric Hospitalizations or Suicide Attempts.  PMHx Is significant for DM2 + neuropathy, HTN, HLD, and housing instability.   Octavia is tolerating detox well. He is day 5 from last use of cocaine. He denies further withdrawal symptoms. We will not make any other changes to his medications at this time.  We will continue to monitor.  Substance Induced Mood Disorder: -Continue Cymbalta 60 mg daily for mood disorder and neuropathy  DM2-insulin-dependent with neuropathy Urge Incontinence  BPH: -Continue sensitive SSI  -Continue Novolog 3 units BID before lunch and dinner -Continue Semglee 24 Units daily -Continue Glipizide XL 10 mg daily with breakfast -Continue Metformin 1000 mg BID with meals -Continue Gabapentin 600 mg TID for neuropathy -Continue Flomax 0.8 mg daily   GERD: -Continue Pepcid 74m BID   HTN: -Continue Amlodipine 10 mg daily   Hyperlipidemia: -Continue Lipitor 40 mg daily    -Continue PRN's: Tylenol, Maalox, Atarax, Milk of Magnesia, Trazodone    Considerations for follow-up: -Recommend high risk screening every 6-12 months with HIV, RPR, hepatitis panel -PCP for insulin dependent DM2 - uncontrolled -PCP for MOCA and memory   Dispo: pending  SRolanda Lundborg MD 03/05/2022 10:51 AM

## 2022-03-05 NOTE — Telephone Encounter (Signed)
Transition Care Management Unsuccessful Follow-up Telephone Call  Date of discharge and from where:  03/02/2022 [facility]  Attempts:  1st Attempt  Reason for unsuccessful TCM follow-up call:  Left voice message

## 2022-03-05 NOTE — ED Notes (Signed)
Patient observed/assessed in room lying in bed. Patient alert and oriented x 4. Affect is flat. Patient denies pain and anxiety. He denies A/V/H. He denies having any thoughts/plan of self harm and harm towards others. Fluid and snack offered. Patient states that appetite has been good throughout the day. Last BM was 03/05/22. Verbalizes no further complaints at this time. Will continue to monitor and support.

## 2022-03-05 NOTE — ED Notes (Signed)
Patient resting quietly in bed with eyes closed. Respirations equal and unlabored, skin warm and dry, NAD. No change in assessment or acuity. Q 15 minute safety checks remain in place.   

## 2022-03-05 NOTE — ED Notes (Signed)
Patient offered snack fluids in dayroom.

## 2022-03-05 NOTE — ED Notes (Signed)
Pt received dinner.

## 2022-03-06 ENCOUNTER — Telehealth: Payer: Self-pay | Admitting: Emergency Medicine

## 2022-03-06 DIAGNOSIS — Z794 Long term (current) use of insulin: Secondary | ICD-10-CM | POA: Diagnosis not present

## 2022-03-06 DIAGNOSIS — F1910 Other psychoactive substance abuse, uncomplicated: Secondary | ICD-10-CM | POA: Diagnosis not present

## 2022-03-06 DIAGNOSIS — Z79899 Other long term (current) drug therapy: Secondary | ICD-10-CM | POA: Diagnosis not present

## 2022-03-06 DIAGNOSIS — F1420 Cocaine dependence, uncomplicated: Secondary | ICD-10-CM | POA: Diagnosis not present

## 2022-03-06 LAB — GLUCOSE, CAPILLARY
Glucose-Capillary: 148 mg/dL — ABNORMAL HIGH (ref 70–99)
Glucose-Capillary: 280 mg/dL — ABNORMAL HIGH (ref 70–99)
Glucose-Capillary: >600 mg/dL (ref 70–99)
Glucose-Capillary: 151 mg/dL — ABNORMAL HIGH (ref 70–99)

## 2022-03-06 NOTE — Telephone Encounter (Signed)
FYI

## 2022-03-06 NOTE — ED Notes (Signed)
Patient observed/assessed at nursing station. Patient alert and oriented x 4. Affect is bright. Patient denies pain and anxiety. He denies A/V/H. He denies having any thoughts/plan of self harm and harm towards others. Fluid and snack offered. Patient states that appetite has been good throughout the day. Last BM was 03/06/22. Verbalizes no further complaints at this time. Will continue to monitor and support.

## 2022-03-06 NOTE — ED Provider Notes (Signed)
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   Glucose-Capillary 01/25/2022 147 (H)  70 - 99 mg/dL Final   Glucose reference range applies only to samples taken after fasting for at least 8 hours.   Glucose-Capillary 01/26/2022 123 (H)  70 - 99 mg/dL Final   Glucose reference range applies only to samples taken after fasting for at least 8 hours.   Glucose-Capillary 01/26/2022 176 (H)  70 - 99 mg/dL Final   Glucose reference range applies only to samples taken after fasting for  at least 8 hours.   Glucose-Capillary 01/26/2022 84  70 - 99 mg/dL Final   Glucose reference range applies only to samples taken after fasting for at least 8 hours.   Glucose-Capillary 01/26/2022  177 (H)  70 - 99 mg/dL Final   Glucose reference range applies only to samples taken after fasting for at least 8 hours.   Glucose-Capillary 01/27/2022 150 (H)  70 - 99 mg/dL Final   Glucose reference range applies only to samples taken after fasting for at least 8 hours.   Glucose-Capillary 01/27/2022 171 (H)  70 - 99 mg/dL Final   Glucose reference range applies only to samples taken after fasting for at least 8 hours.   Glucose-Capillary 01/27/2022 159 (H)  70 - 99 mg/dL Final   Glucose reference range applies only to samples taken after fasting for at least 8 hours.   Glucose-Capillary 01/27/2022 67 (L)  70 - 99 mg/dL Final   Glucose reference range applies only to samples taken after fasting for at least 8 hours.   Glucose-Capillary 01/27/2022 107 (H)  70 - 99 mg/dL Final   Glucose reference range applies only to samples taken after fasting for at least 8 hours.   Glucose-Capillary 01/27/2022 144 (H)  70 - 99 mg/dL Final   Glucose reference range applies only to samples taken after fasting for at least 8 hours.   Glucose-Capillary 01/28/2022 175 (H)  70 - 99 mg/dL Final   Glucose reference range applies only to samples taken after fasting for at least 8 hours.   Glucose-Capillary 01/28/2022 198 (H)  70 - 99 mg/dL Final   Glucose reference range applies only to samples taken after fasting for at least 8 hours.   Glucose-Capillary 01/28/2022 186 (H)  70 - 99 mg/dL Final   Glucose reference range applies only to samples taken after fasting for at least 8 hours.   Glucose-Capillary 01/28/2022 198 (H)  70 - 99 mg/dL Final   Glucose reference range applies only to samples taken after fasting for at least 8 hours.   Glucose-Capillary 01/29/2022 111 (H)  70 - 99 mg/dL Final   Glucose reference range applies only to samples taken after fasting for at least 8 hours.   Glucose-Capillary 01/29/2022 160 (H)  70 - 99 mg/dL Final   Glucose reference range applies only to samples taken after fasting  for at least 8 hours.   Glucose-Capillary 01/29/2022 209 (H)  70 - 99 mg/dL Final   Glucose reference range applies only to samples taken after fasting for at least 8 hours.   Glucose-Capillary 01/29/2022 155 (H)  70 - 99 mg/dL Final   Glucose reference range applies only to samples taken after fasting for at least 8 hours.   Glucose-Capillary 01/30/2022 157 (H)  70 - 99 mg/dL Final   Glucose reference range applies only to samples taken after fasting for at least 8 hours.   Glucose-Capillary 01/30/2022 168 (H)  70 - 99 mg/dL Final   Glucose reference range applies only to samples taken after fasting for at least 8 hours.   Glucose-Capillary 01/30/2022 217 (H)  70 - 99 mg/dL Final   Glucose reference range applies only to samples taken after fasting for at least 8 hours.   Glucose-Capillary 01/30/2022 246 (H)  70 - 99 mg/dL Final   Glucose reference range applies only to samples taken after fasting for at least 8 hours.   Glucose-Capillary 01/30/2022 287 (H)  70 - 99 mg/dL Final   Glucose  reference range applies only to samples taken after fasting for at least 8 hours.   Glucose-Capillary 01/31/2022 138 (H)  70 - 99 mg/dL Final   Glucose reference range applies only to samples taken after fasting for at least 8 hours.   Glucose-Capillary 01/31/2022 193 (H)  70 - 99 mg/dL Final   Glucose reference range applies only to samples taken after fasting for at least 8 hours.   Glucose-Capillary 01/31/2022 274 (H)  70 - 99 mg/dL Final   Glucose reference range applies only to samples taken after fasting for at least 8 hours.   Glucose-Capillary 01/31/2022 320 (H)  70 - 99 mg/dL Final   Glucose reference range applies only to samples taken after fasting for at least 8 hours.   Glucose-Capillary 01/31/2022 160 (H)  70 - 99 mg/dL Final   Glucose reference range applies only to samples taken after fasting for at least 8 hours.   Glucose-Capillary 02/01/2022 184 (H)  70 - 99 mg/dL Final   Glucose  reference range applies only to samples taken after fasting for at least 8 hours.   Glucose-Capillary 02/01/2022 138 (H)  70 - 99 mg/dL Final   Glucose reference range applies only to samples taken after fasting for at least 8 hours.   Glucose-Capillary 02/01/2022 171 (H)  70 - 99 mg/dL Final   Glucose reference range applies only to samples taken after fasting for at least 8 hours.   Glucose-Capillary 02/01/2022 124 (H)  70 - 99 mg/dL Final   Glucose reference range applies only to samples taken after fasting for at least 8 hours.   Glucose-Capillary 02/02/2022 112 (H)  70 - 99 mg/dL Final   Glucose reference range applies only to samples taken after fasting for at least 8 hours.   Glucose-Capillary 02/02/2022 125 (H)  70 - 99 mg/dL Final   Glucose reference range applies only to samples taken after fasting for at least 8 hours.   Glucose-Capillary 02/02/2022 274 (H)  70 - 99 mg/dL Final   Glucose reference range applies only to samples taken after fasting for at least 8 hours.   Glucose-Capillary 02/02/2022 239 (H)  70 - 99 mg/dL Final   Glucose reference range applies only to samples taken after fasting for at least 8 hours.   Glucose-Capillary 02/02/2022 190 (H)  70 - 99 mg/dL Final   Glucose reference range applies only to samples taken after fasting for at least 8 hours.   Glucose-Capillary 02/02/2022 183 (H)  70 - 99 mg/dL Final   Glucose reference range applies only to samples taken after fasting for at least 8 hours.   Glucose-Capillary 02/03/2022 113 (H)  70 - 99 mg/dL Final   Glucose reference range applies only to samples taken after fasting for at least 8 hours.   Glucose-Capillary 02/03/2022 158 (H)  70 - 99 mg/dL Final   Glucose reference range applies only to samples taken after fasting for at least 8 hours.   Glucose-Capillary 02/03/2022 100 (H)  70 - 99 mg/dL Final   Glucose reference range applies only to samples taken after fasting for at least 8 hours.    Glucose-Capillary 02/03/2022 162 (H)  70 - 99 mg/dL Final   Glucose reference range applies only to samples taken after fasting for at least 8 hours.   Glucose-Capillary 02/03/2022 114 (H)  70 - 99 mg/dL Final   Glucose reference range applies only to samples taken after fasting for at least 8 hours.   Glucose-Capillary 02/04/2022  138 (H)  70 - 99 mg/dL Final   Glucose reference range applies only to samples taken after fasting for at least 8 hours.   Glucose-Capillary 02/04/2022 157 (H)  70 - 99 mg/dL Final   Glucose reference range applies only to samples taken after fasting for at least 8 hours.   Glucose-Capillary 02/04/2022 208 (H)  70 - 99 mg/dL Final   Glucose reference range applies only to samples taken after fasting for at least 8 hours.   Glucose-Capillary 02/04/2022 212 (H)  70 - 99 mg/dL Final   Glucose reference range applies only to samples taken after fasting for at least 8 hours.   Glucose-Capillary 02/05/2022 184 (H)  70 - 99 mg/dL Final   Glucose reference range applies only to samples taken after fasting for at least 8 hours.   Glucose-Capillary 02/05/2022 192 (H)  70 - 99 mg/dL Final   Glucose reference range applies only to samples taken after fasting for at least 8 hours.   Glucose-Capillary 02/05/2022 128 (H)  70 - 99 mg/dL Final   Glucose reference range applies only to samples taken after fasting for at least 8 hours.   Glucose-Capillary 02/05/2022 165 (H)  70 - 99 mg/dL Final   Glucose reference range applies only to samples taken after fasting for at least 8 hours.   Glucose-Capillary 02/06/2022 111 (H)  70 - 99 mg/dL Final   Glucose reference range applies only to samples taken after fasting for at least 8 hours.   Glucose-Capillary 02/05/2022 262 (H)  70 - 99 mg/dL Final   Glucose reference range applies only to samples taken after fasting for at least 8 hours.  Admission on 01/20/2022, Discharged on 01/21/2022  Component Date Value Ref Range Status   SARS  Coronavirus 2 by RT PCR 01/20/2022 NEGATIVE  NEGATIVE Final   Comment: (NOTE) SARS-CoV-2 target nucleic acids are NOT DETECTED.  The SARS-CoV-2 RNA is generally detectable in upper respiratory specimens during the acute phase of infection. The lowest concentration of SARS-CoV-2 viral copies this assay can detect is 138 copies/mL. A negative result does not preclude SARS-Cov-2 infection and should not be used as the sole basis for treatment or other patient management decisions. A negative result may occur with  improper specimen collection/handling, submission of specimen other than nasopharyngeal swab, presence of viral mutation(s) within the areas targeted by this assay, and inadequate number of viral copies(<138 copies/mL). A negative result must be combined with clinical observations, patient history, and epidemiological information. The expected result is Negative.  Fact Sheet for Patients:  EntrepreneurPulse.com.au  Fact Sheet for Healthcare Providers:  IncredibleEmployment.be  This test is no                          t yet approved or cleared by the [place] FDA and  has been authorized for detection and/or diagnosis of SARS-CoV-2 by FDA under an Emergency Use Authorization (EUA). This EUA will remain  in effect (meaning this test can be used) for the duration of the COVID-19 declaration under Section 564(b)(1) of the Act, 21 U.S.C.section 360bbb-3(b)(1), unless the authorization is terminated  or revoked sooner.       Influenza A by PCR 01/20/2022 NEGATIVE  NEGATIVE Final   Influenza B by PCR 01/20/2022 NEGATIVE  NEGATIVE Final   Comment: (NOTE) The Xpert Xpress SARS-CoV-2/FLU/RSV plus assay is intended as an aid in the diagnosis of influenza from Nasopharyngeal swab specimens and should not be  used as a sole basis for treatment. Nasal washings and aspirates are unacceptable for Xpert Xpress SARS-CoV-2/FLU/RSV testing.  Fact  Sheet for Patients: EntrepreneurPulse.com.au  Fact Sheet for Healthcare Providers: IncredibleEmployment.be  This test is not yet approved or cleared by the [place] FDA and has been authorized for detection and/or diagnosis of SARS-CoV-2 by FDA under an Emergency Use Authorization (EUA). This EUA will remain in effect (meaning this test can be used) for the duration of the COVID-19 declaration under Section 564(b)(1) of the Act, 21 U.S.C. section 360bbb-3(b)(1), unless the authorization is terminated or revoked.  Performed at [facility], [place] [address], [place], [place] [address]    Sodium 01/20/2022 136  135 - 145 mmol/L Final   Potassium 01/20/2022 4.0  3.5 - 5.1 mmol/L Final   Chloride 01/20/2022 103  98 - 111 mmol/L Final   CO2 01/20/2022 24  22 - 32 mmol/L Final   Glucose, Bld 01/20/2022 360 (H)  70 - 99 mg/dL Final   Glucose reference range applies only to samples taken after fasting for at least 8 hours.   BUN 01/20/2022 12  8 - 23 mg/dL Final   Creatinine, Ser 01/20/2022 1.01  0.61 - 1.24 mg/dL Final   Calcium 01/20/2022 9.4  8.9 - 10.3 mg/dL Final   Total Protein 01/20/2022 7.3  6.5 - 8.1 g/dL Final   Albumin 01/20/2022 3.4 (L)  3.5 - 5.0 g/dL Final   AST 01/20/2022 14 (L)  15 - 41 U/L Final   ALT 01/20/2022 10  0 - 44 U/L Final   Alkaline Phosphatase 01/20/2022 101  38 - 126 U/L Final   Total Bilirubin 01/20/2022 0.3  0.3 - 1.2 mg/dL Final   GFR, Estimated 01/20/2022 >60  >60 mL/min Final   Comment: (NOTE) Calculated using the CKD-EPI Creatinine Equation (2021)    Anion gap 01/20/2022 9  5 - 15 Final   Performed at [facility] [address], [place], [place] [address]   Alcohol, Ethyl (B) 01/20/2022 <10  <10 mg/dL Final   Comment: (NOTE) Lowest detectable limit for serum alcohol is 10 mg/dL.  For medical purposes only. Performed at [facility], [place] [address] [place], [place] [address]    Opiates  01/20/2022 NONE DETECTED  NONE DETECTED Final   Cocaine 01/20/2022 POSITIVE (A)  NONE DETECTED Final   Benzodiazepines 01/20/2022 NONE DETECTED  NONE DETECTED Final   Amphetamines 01/20/2022 NONE DETECTED  NONE DETECTED Final   Tetrahydrocannabinol 01/20/2022 NONE DETECTED  NONE DETECTED Final   Barbiturates 01/20/2022 NONE DETECTED  NONE DETECTED Final   Comment: (NOTE) DRUG SCREEN FOR MEDICAL PURPOSES ONLY.  IF CONFIRMATION IS NEEDED FOR ANY PURPOSE, NOTIFY LAB WITHIN 5 DAYS.  LOWEST DETECTABLE LIMITS FOR URINE DRUG SCREEN Drug Class                     Cutoff (ng/mL) Amphetamine and metabolites    1000 Barbiturate and metabolites    200 Benzodiazepine                 200 Opiates and metabolites        300 Cocaine and metabolites        300 THC                            50 Performed at [facility], [place] [address] [place], [place] [address]  WBC 01/20/2022 5.2  4.0 - 10.5 K/uL Final   RBC 01/20/2022 4.73  4.22 - 5.81 MIL/uL Final   Hemoglobin 01/20/2022 14.3  13.0 - 17.0 g/dL Final   HCT 01/20/2022 43.1  39.0 - 52.0 % Final   MCV 01/20/2022 91.1  80.0 - 100.0 fL Final   MCH 01/20/2022 30.2  26.0 - 34.0 pg Final   MCHC 01/20/2022 33.2  30.0 - 36.0 g/dL Final   RDW 01/20/2022 14.1  11.5 - 15.5 % Final   Platelets 01/20/2022 444 (H)  150 - 400 K/uL Final   nRBC 01/20/2022 0.0  0.0 - 0.2 % Final   Neutrophils Relative % 01/20/2022 61  % Final   Neutro Abs 01/20/2022 3.2  1.7 - 7.7 K/uL Final   Lymphocytes Relative 01/20/2022 29  % Final   Lymphs Abs 01/20/2022 1.5  0.7 - 4.0 K/uL Final   Monocytes Relative 01/20/2022 7  % Final   Monocytes Absolute 01/20/2022 0.4  0.1 - 1.0 K/uL Final   Eosinophils Relative 01/20/2022 2  % Final   Eosinophils Absolute 01/20/2022 0.1  0.0 - 0.5 K/uL Final   Basophils Relative 01/20/2022 1  % Final   Basophils Absolute 01/20/2022 0.0  0.0 - 0.1 K/uL Final   Immature Granulocytes 01/20/2022 0  % Final   Abs Immature Granulocytes  01/20/2022 0.01  0.00 - 0.07 K/uL Final   Performed at [facility], [place] [address], [place], [place] [address]   Salicylate Lvl 29/19/1660 <7.0 (L)  7.0 - 30.0 mg/dL Final   Performed at [facility] [address] [place], [place] [address]   Acetaminophen (Tylenol), Serum 01/20/2022 <10 (L)  10 - 30 ug/mL Final   Comment: (NOTE) Therapeutic concentrations vary significantly. A range of 10-30 ug/mL  may be an effective concentration for many patients. However, some  are best treated at concentrations outside of this range. Acetaminophen concentrations >150 ug/mL at 4 hours after ingestion  and >50 ug/mL at 12 hours after ingestion are often associated with  toxic reactions.  Performed at [facility], [place] [address] [place], [place] [address]    Glucose-Capillary 01/20/2022 344 (H)  70 - 99 mg/dL Final   Glucose reference range applies only to samples taken after fasting for at least 8 hours.   Comment 1 01/20/2022 Notify RN   Final   Comment 2 01/20/2022 Document in Chart   Final   Glucose-Capillary 01/20/2022 479 (H)  70 - 99 mg/dL Final   Glucose reference range applies only to samples taken after fasting for at least 8 hours.   Glucose-Capillary 01/21/2022 243 (H)  70 - 99 mg/dL Final   Glucose reference range applies only to samples taken after fasting for at least 8 hours.   Glucose-Capillary 01/21/2022 224 (H)  70 - 99 mg/dL Final   Glucose reference range applies only to samples taken after fasting for at least 8 hours.   Glucose-Capillary 01/21/2022 282 (H)  70 - 99 mg/dL Final   Glucose reference range applies only to samples taken after fasting for at least 8 hours.  Admission on 01/18/2022, Discharged on 01/19/2022  Component Date Value Ref Range Status   WBC 01/18/2022 5.3  4.0 - 10.5 K/uL Final   RBC 01/18/2022 4.47  4.22 - 5.81 MIL/uL Final   Hemoglobin 01/18/2022 13.7  13.0 - 17.0 g/dL Final   HCT 01/18/2022 41.0  39.0 - 52.0 % Final   MCV  01/18/2022 91.7  80.0 - 100.0 fL Final   MCH 01/18/2022 30.6  26.0 - 34.0 pg Final   MCHC 01/18/2022 33.4  30.0 - 36.0 g/dL Final   RDW 01/18/2022 14.2  11.5 - 15.5 % Final   Platelets 01/18/2022 445 (H)  150 - 400 K/uL Final   nRBC 01/18/2022 0.0  0.0 - 0.2 % Final   Performed at [facility], [place] [address], [place], [place] [address]   Sodium 01/18/2022 136  135 - 145 mmol/L Final   Potassium 01/18/2022 4.3  3.5 - 5.1 mmol/L Final   Chloride 01/18/2022 100  98 - 111 mmol/L Final   CO2 01/18/2022 22  22 - 32 mmol/L Final   Glucose, Bld 01/18/2022 499 (H)  70 - 99 mg/dL Final   Glucose reference range applies only to samples taken after fasting for at least 8 hours.   BUN 01/18/2022 9  8 - 23 mg/dL Final   Creatinine, Ser 01/18/2022 1.23  0.61 - 1.24 mg/dL Final   Calcium 01/18/2022 9.1  8.9 - 10.3 mg/dL Final   Total Protein 01/18/2022 6.6  6.5 - 8.1 g/dL Final   Albumin 01/18/2022 3.2 (L)  3.5 - 5.0 g/dL Final   AST 01/18/2022 18  15 - 41 U/L Final   ALT 01/18/2022 9  0 - 44 U/L Final   Alkaline Phosphatase 01/18/2022 95  38 - 126 U/L Final   Total Bilirubin 01/18/2022 0.3  0.3 - 1.2 mg/dL Final   GFR, Estimated 01/18/2022 >60  >60 mL/min Final   Comment: (NOTE) Calculated using the CKD-EPI Creatinine Equation (2021)    Anion gap 01/18/2022 14  5 - 15 Final   Performed at [facility] [address] [place], [place] [address]   Color, Urine 01/18/2022 YELLOW  YELLOW Final   APPearance 01/18/2022 CLEAR  CLEAR Final   Specific Gravity, Urine 01/18/2022 1.025  1.005 - 1.030 Final   pH 01/18/2022 6.0  5.0 - 8.0 Final   Glucose, UA 01/18/2022 >=500 (A)  NEGATIVE mg/dL Final   Hgb urine dipstick 01/18/2022 NEGATIVE  NEGATIVE Final   Bilirubin Urine 01/18/2022 NEGATIVE  NEGATIVE Final   Ketones, ur 01/18/2022 NEGATIVE  NEGATIVE mg/dL Final   Protein, ur 01/18/2022 NEGATIVE  NEGATIVE mg/dL Final   Nitrite 01/18/2022 NEGATIVE  NEGATIVE Final   Leukocytes,Ua 01/18/2022  SMALL (A)  NEGATIVE Final   RBC / HPF 01/18/2022 0-5  0 - 5 RBC/hpf Final   WBC, UA 01/18/2022 21-50  0 - 5 WBC/hpf Final   Bacteria, UA 01/18/2022 RARE (A)  NONE SEEN Final   Squamous Epithelial / LPF 01/18/2022 0-5  0 - 5 Final   Budding Yeast 01/18/2022 PRESENT   Final   Performed at [facility], [place] [address], [place], [place] [address]   Opiates 01/18/2022 NONE DETECTED  NONE DETECTED Final   Cocaine 01/18/2022 POSITIVE (A)  NONE DETECTED Final   Benzodiazepines 01/18/2022 NONE DETECTED  NONE DETECTED Final   Amphetamines 01/18/2022 NONE DETECTED  NONE DETECTED Final   Tetrahydrocannabinol 01/18/2022 NONE DETECTED  NONE DETECTED Final   Barbiturates 01/18/2022 NONE DETECTED  NONE DETECTED Final   Comment: (NOTE) DRUG SCREEN FOR MEDICAL PURPOSES ONLY.  IF CONFIRMATION IS NEEDED FOR ANY PURPOSE, NOTIFY LAB WITHIN 5 DAYS.  LOWEST DETECTABLE LIMITS FOR URINE DRUG SCREEN Drug Class                     Cutoff (ng/mL) Amphetamine and metabolites  1000 Barbiturate and metabolites    200 Benzodiazepine                 200 Opiates and metabolites        300 Cocaine and metabolites        300 THC                            50 Performed at [facility], [place] [address] [place], [place] [address]    Glucose-Capillary 01/18/2022 483 (H)  70 - 99 mg/dL Final   Glucose reference range applies only to samples taken after fasting for at least 8 hours.   Glucose-Capillary 01/19/2022 586 (HH)  70 - 99 mg/dL Final   Glucose reference range applies only to samples taken after fasting for at least 8 hours.   Glucose-Capillary 01/19/2022 456 (H)  70 - 99 mg/dL Final   Glucose reference range applies only to samples taken after fasting for at least 8 hours.   Glucose-Capillary 01/19/2022 515 (HH)  70 - 99 mg/dL Final   Glucose reference range applies only to samples taken after fasting for at least 8 hours.   Comment 1 01/19/2022 Notify RN   Final   Glucose-Capillary 01/19/2022  164 (H)  70 - 99 mg/dL Final   Glucose reference range applies only to samples taken after fasting for at least 8 hours.  Admission on 01/07/2022, Discharged on 01/07/2022  Component Date Value Ref Range Status   Glucose-Capillary 01/07/2022 267 (H)  70 - 99 mg/dL Final   Glucose reference range applies only to samples taken after fasting for at least 8 hours.  Admission on 01/02/2022, Discharged on 01/02/2022  Component Date Value Ref Range Status   WBC 01/02/2022 7.0  4.0 - 10.5 K/uL Final   RBC 01/02/2022 4.54  4.22 - 5.81 MIL/uL Final   Hemoglobin 01/02/2022 13.9  13.0 - 17.0 g/dL Final   HCT 01/02/2022 41.4  39.0 - 52.0 % Final   MCV 01/02/2022 91.2  80.0 - 100.0 fL Final   MCH 01/02/2022 30.6  26.0 - 34.0 pg Final   MCHC 01/02/2022 33.6  30.0 - 36.0 g/dL Final   RDW 01/02/2022 14.1  11.5 - 15.5 % Final   Platelets 01/02/2022 369  150 - 400 K/uL Final   nRBC 01/02/2022 0.0  0.0 - 0.2 % Final   Neutrophils Relative % 01/02/2022 59  % Final   Neutro Abs 01/02/2022 4.1  1.7 - 7.7 K/uL Final   Lymphocytes Relative 01/02/2022 28  % Final   Lymphs Abs 01/02/2022 1.9  0.7 - 4.0 K/uL Final   Monocytes Relative 01/02/2022 8  % Final   Monocytes Absolute 01/02/2022 0.5  0.1 - 1.0 K/uL Final   Eosinophils Relative 01/02/2022 4  % Final   Eosinophils Absolute 01/02/2022 0.3  0.0 - 0.5 K/uL Final   Basophils Relative 01/02/2022 1  % Final   Basophils Absolute 01/02/2022 0.1  0.0 - 0.1 K/uL Final   Immature Granulocytes 01/02/2022 0  % Final   Abs Immature Granulocytes 01/02/2022 0.02  0.00 - 0.07 K/uL Final   Performed at [facility], [place] [address] [place], [place] [address]   Sodium 01/02/2022 138  135 - 145 mmol/L Final   Potassium 01/02/2022 4.1  3.5 - 5.1 mmol/L Final   Chloride 01/02/2022 104  98 - 111 mmol/L Final   CO2 01/02/2022 25  22 - 32 mmol/L  Final   Glucose, Bld 01/02/2022 216 (H)  70 - 99 mg/dL Final   Glucose reference range applies only to samples taken after  fasting for at least 8 hours.   BUN 01/02/2022 11  8 - 23 mg/dL Final   Creatinine, Ser 01/02/2022 0.98  0.61 - 1.24 mg/dL Final   Calcium 01/02/2022 9.5  8.9 - 10.3 mg/dL Final   Total Protein 01/02/2022 7.4  6.5 - 8.1 g/dL Final   Albumin 01/02/2022 3.4 (L)  3.5 - 5.0 g/dL Final   AST 01/02/2022 12 (L)  15 - 41 U/L Final   ALT 01/02/2022 11  0 - 44 U/L Final   Alkaline Phosphatase 01/02/2022 90  38 - 126 U/L Final   Total Bilirubin 01/02/2022 0.9  0.3 - 1.2 mg/dL Final   GFR, Estimated 01/02/2022 >60  >60 mL/min Final   Comment: (NOTE) Calculated using the CKD-EPI Creatinine Equation (2021)    Anion gap 01/02/2022 9  5 - 15 Final   Performed at [facility] [address], [place], [place] [address]  [facility] on 11/01/2021  Component Date Value Ref Range Status   POC Glucose 11/27/2021 301 (A)  70 - 99 mg/dl Final   1 hr after lunch, had not taken insulin  Admission on 10/13/2021, Discharged on 10/15/2021  Component Date Value Ref Range Status   Glucose-Capillary 10/13/2021 388 (H)  70 - 99 mg/dL Final   Glucose reference range applies only to samples taken after fasting for at least 8 hours.   SARS Coronavirus 2 by RT PCR 10/13/2021 NEGATIVE  NEGATIVE Final   Comment: (NOTE) SARS-CoV-2 target nucleic acids are NOT DETECTED.  The SARS-CoV-2 RNA is generally detectable in upper respiratory specimens during the acute phase of infection. The lowest concentration of SARS-CoV-2 viral copies this assay can detect is 138 copies/mL. A negative result does not preclude SARS-Cov-2 infection and should not be used as the sole basis for treatment or other patient management decisions. A negative result may occur with  improper specimen collection/handling, submission of specimen other than nasopharyngeal swab, presence of viral mutation(s) within the areas targeted by this assay, and inadequate number of viral copies(<138 copies/mL). A negative result must be  combined with clinical observations, patient history, and epidemiological information. The expected result is Negative.  Fact Sheet for Patients:  EntrepreneurPulse.com.au  Fact Sheet for Healthcare Providers:  IncredibleEmployment.be  This test is no                          t yet approved or cleared by the [place] FDA and  has been authorized for detection and/or diagnosis of SARS-CoV-2 by FDA under an Emergency Use Authorization (EUA). This EUA will remain  in effect (meaning this test can be used) for the duration of the COVID-19 declaration under Section 564(b)(1) of the Act, 21 U.S.C.section 360bbb-3(b)(1), unless the authorization is terminated  or revoked sooner.       Influenza A by PCR 10/13/2021 NEGATIVE  NEGATIVE Final   Influenza B by PCR 10/13/2021 NEGATIVE  NEGATIVE Final   Comment: (NOTE) The Xpert Xpress SARS-CoV-2/FLU/RSV plus assay is intended as an aid in the diagnosis of influenza from Nasopharyngeal swab specimens and should not be used as a sole basis for treatment. Nasal washings and aspirates are unacceptable for Xpert Xpress SARS-CoV-2/FLU/RSV testing.  Fact Sheet for Patients: EntrepreneurPulse.com.au  Fact Sheet for Healthcare Providers: IncredibleEmployment.be  This test is not  yet approved or cleared by the [place] and has been authorized for detection and/or diagnosis of SARS-CoV-2 by FDA under an Emergency Use Authorization (EUA). This EUA will remain in effect (meaning this test can be used) for the duration of the COVID-19 declaration under Section 564(b)(1) of the Act, 21 U.S.C. section 360bbb-3(b)(1), unless the authorization is terminated or revoked.  Performed at [facility], [place] [address] [place], [place] [address]    WBC 10/13/2021 9.5  4.0 - 10.5 K/uL Final   RBC 10/13/2021 4.23  4.22 - 5.81 MIL/uL Final   Hemoglobin 10/13/2021 13.0   13.0 - 17.0 g/dL Final   HCT 10/13/2021 40.4  39.0 - 52.0 % Final   MCV 10/13/2021 95.5  80.0 - 100.0 fL Final   MCH 10/13/2021 30.7  26.0 - 34.0 pg Final   MCHC 10/13/2021 32.2  30.0 - 36.0 g/dL Final   RDW 10/13/2021 13.4  11.5 - 15.5 % Final   Platelets 10/13/2021 551 (H)  150 - 400 K/uL Final   nRBC 10/13/2021 0.0  0.0 - 0.2 % Final   Neutrophils Relative % 10/13/2021 68  % Final   Neutro Abs 10/13/2021 6.5  1.7 - 7.7 K/uL Final   Lymphocytes Relative 10/13/2021 18  % Final   Lymphs Abs 10/13/2021 1.7  0.7 - 4.0 K/uL Final   Monocytes Relative 10/13/2021 9  % Final   Monocytes Absolute 10/13/2021 0.8  0.1 - 1.0 K/uL Final   Eosinophils Relative 10/13/2021 3  % Final   Eosinophils Absolute 10/13/2021 0.3  0.0 - 0.5 K/uL Final   Basophils Relative 10/13/2021 1  % Final   Basophils Absolute 10/13/2021 0.1  0.0 - 0.1 K/uL Final   Immature Granulocytes 10/13/2021 1  % Final   Abs Immature Granulocytes 10/13/2021 0.11 (H)  0.00 - 0.07 K/uL Final   Performed at [facility], [place] [address], [place], [place] [address]   Sodium 10/13/2021 136  135 - 145 mmol/L Final   Potassium 10/13/2021 4.3  3.5 - 5.1 mmol/L Final   Chloride 10/13/2021 104  98 - 111 mmol/L Final   CO2 10/13/2021 24  22 - 32 mmol/L Final   Glucose, Bld 10/13/2021 296 (H)  70 - 99 mg/dL Final   Glucose reference range applies only to samples taken after fasting for at least 8 hours.   BUN 10/13/2021 14  8 - 23 mg/dL Final   Creatinine, Ser 10/13/2021 1.15  0.61 - 1.24 mg/dL Final   Calcium 10/13/2021 9.0  8.9 - 10.3 mg/dL Final   Total Protein 10/13/2021 7.5  6.5 - 8.1 g/dL Final   Albumin 10/13/2021 3.0 (L)  3.5 - 5.0 g/dL Final   AST 10/13/2021 13 (L)  15 - 41 U/L Final   ALT 10/13/2021 10  0 - 44 U/L Final   Alkaline Phosphatase 10/13/2021 84  38 - 126 U/L Final   Total Bilirubin 10/13/2021 0.5  0.3 - 1.2 mg/dL Final   GFR, Estimated 10/13/2021 >60  >60 mL/min Final   Comment: (NOTE) Calculated using the  CKD-EPI Creatinine Equation (2021)    Anion gap 10/13/2021 8  5 - 15 Final   Performed at [facility], [place] [address] [place], [place] [address]   Color, Urine 10/13/2021 YELLOW  YELLOW Final   APPearance 10/13/2021 CLOUDY (A)  CLEAR Final   Specific Gravity, Urine 10/13/2021 1.022  1.005 - 1.030 Final   pH 10/13/2021 5.0  5.0 -  8.0 Final   Glucose, UA 10/13/2021 >=500 (A)  NEGATIVE mg/dL Final   Hgb urine dipstick 10/13/2021 LARGE (A)  NEGATIVE Final   Bilirubin Urine 10/13/2021 NEGATIVE  NEGATIVE Final   Ketones, ur 10/13/2021 NEGATIVE  NEGATIVE mg/dL Final   Protein, ur 10/13/2021 30 (A)  NEGATIVE mg/dL Final   Nitrite 10/13/2021 NEGATIVE  NEGATIVE Final   Leukocytes,Ua 10/13/2021 LARGE (A)  NEGATIVE Final   RBC / HPF 10/13/2021 >50 (H)  0 - 5 RBC/hpf Final   WBC, UA 10/13/2021 >50 (H)  0 - 5 WBC/hpf Final   Bacteria, UA 10/13/2021 RARE (A)  NONE SEEN Final   Squamous Epithelial / LPF 10/13/2021 0-5  0 - 5 Final   WBC Clumps 10/13/2021 PRESENT   Final   Mucus 10/13/2021 PRESENT   Final   Budding Yeast 10/13/2021 PRESENT   Final   Non Squamous Epithelial 10/13/2021 0-5 (A)  NONE SEEN Final   Performed at [facility], [place] [address] [place], [place] [address]   pH, Ven 10/13/2021 7.457 (H)  7.25 - 7.43 Final   pCO2, Ven 10/13/2021 38.1 (L)  44 - 60 mmHg Final   pO2, Ven 10/13/2021 174 (H)  32 - 45 mmHg Final   Bicarbonate 10/13/2021 26.9  20.0 - 28.0 mmol/L Final   TCO2 10/13/2021 28  22 - 32 mmol/L Final   O2 Saturation 10/13/2021 100  % Final   Acid-Base Excess 10/13/2021 3.0 (H)  0.0 - 2.0 mmol/L Final   Sodium 10/13/2021 137  135 - 145 mmol/L Final   Potassium 10/13/2021 4.3  3.5 - 5.1 mmol/L Final   Calcium, Ion 10/13/2021 1.10 (L)  1.15 - 1.40 mmol/L Final   HCT 10/13/2021 40.0  39.0 - 52.0 % Final   Hemoglobin 10/13/2021 13.6  13.0 - 17.0 g/dL Final   Sample type 10/13/2021 VENOUS   Final   Opiates 10/13/2021 NONE DETECTED  NONE DETECTED Final   Cocaine  10/13/2021 POSITIVE (A)  NONE DETECTED Final   Benzodiazepines 10/13/2021 NONE DETECTED  NONE DETECTED Final   Amphetamines 10/13/2021 NONE DETECTED  NONE DETECTED Final   Tetrahydrocannabinol 10/13/2021 NONE DETECTED  NONE DETECTED Final   Barbiturates 10/13/2021 NONE DETECTED  NONE DETECTED Final   Comment: (NOTE) DRUG SCREEN FOR MEDICAL PURPOSES ONLY.  IF CONFIRMATION IS NEEDED FOR ANY PURPOSE, NOTIFY LAB WITHIN 5 DAYS.  LOWEST DETECTABLE LIMITS FOR URINE DRUG SCREEN Drug Class                     Cutoff (ng/mL) Amphetamine and metabolites    1000 Barbiturate and metabolites    200 Benzodiazepine                 734 Tricyclics and metabolites     300 Opiates and metabolites        300 Cocaine and metabolites        300 THC                            50 Performed at [facility], [place] [address], [place], [place] [address]    Adenovirus 10/13/2021 NOT DETECTED  NOT DETECTED Final   Coronavirus 229E 10/13/2021 NOT DETECTED  NOT DETECTED Final   Comment: (NOTE) The Coronavirus on the Respiratory Panel, DOES NOT test for the novel  Coronavirus (2019 nCoV)    Coronavirus HKU1 10/13/2021 NOT DETECTED  NOT DETECTED Final   Coronavirus  NL63 10/13/2021 NOT DETECTED  NOT DETECTED Final   Coronavirus OC43 10/13/2021 NOT DETECTED  NOT DETECTED Final   Metapneumovirus 10/13/2021 NOT DETECTED  NOT DETECTED Final   Rhinovirus / Enterovirus 10/13/2021 NOT DETECTED  NOT DETECTED Final   Influenza A 10/13/2021 NOT DETECTED  NOT DETECTED Final   Influenza B 10/13/2021 NOT DETECTED  NOT DETECTED Final   Parainfluenza Virus 1 10/13/2021 NOT DETECTED  NOT DETECTED Final   Parainfluenza Virus 2 10/13/2021 NOT DETECTED  NOT DETECTED Final   Parainfluenza Virus 3 10/13/2021 NOT DETECTED  NOT DETECTED Final   Parainfluenza Virus 4 10/13/2021 NOT DETECTED  NOT DETECTED Final   Respiratory Syncytial Virus 10/13/2021 NOT DETECTED  NOT DETECTED Final   Bordetella pertussis 10/13/2021 NOT DETECTED   NOT DETECTED Final   Bordetella Parapertussis 10/13/2021 NOT DETECTED  NOT DETECTED Final   Chlamydophila pneumoniae 10/13/2021 NOT DETECTED  NOT DETECTED Final   Mycoplasma pneumoniae 10/13/2021 NOT DETECTED  NOT DETECTED Final   Performed at [facility], [place] [address], [place], [place] [address]   Procalcitonin 10/13/2021 <0.10  ng/mL Final   Comment:        Interpretation: PCT (Procalcitonin) <= 0.5 ng/mL: Systemic infection (sepsis) is not likely. Local bacterial infection is possible. (NOTE)       Sepsis PCT Algorithm           Lower Respiratory Tract                                      Infection PCT Algorithm    ----------------------------     ----------------------------         PCT < 0.25 ng/mL                PCT < 0.10 ng/mL          Strongly encourage             Strongly discourage   discontinuation of antibiotics    initiation of antibiotics    ----------------------------     -----------------------------       PCT 0.25 - 0.50 ng/mL            PCT 0.10 - 0.25 ng/mL               OR       >80% decrease in PCT            Discourage initiation of                                            antibiotics      Encourage discontinuation           of antibiotics    ----------------------------     -----------------------------         PCT >= 0.50 ng/mL              PCT 0.26 - 0.50 ng/mL               AND                                 <80% decrease in PCT             Encourage initiation of  antibiotics       Encourage continuation           of antibiotics    ----------------------------     -----------------------------        PCT >= 0.50 ng/mL                  PCT > 0.50 ng/mL               AND         increase in PCT                  Strongly encourage                                      initiation of antibiotics    Strongly encourage escalation           of antibiotics                                      -----------------------------                                           PCT <= 0.25 ng/mL                                                 OR                                        > 80% decrease in PCT                                      Discontinue / Do not initiate                                             antibiotics  Performed at [facility], [address] [place], [place] [address]    B Natriuretic Peptide 10/13/2021 23.6  0.0 - 100.0 pg/mL Final   Performed at [place] [address], [place], [place] [address]   L. pneumophila Serogp 1 Ur Ag 10/13/2021 Negative  Negative Final   Comment: (NOTE) Presumptive negative for L. pneumophila serogroup 1 antigen in urine, suggesting no recent or current infection. Legionnaires' disease cannot be ruled out since other serogroups and species may also cause disease. Performed At: [facility] [place], [place] [id number] Rush Farmer MD BM:[id number]    Source of Sample 10/13/2021 URINE, RANDOM   Final   Performed at [facility], [place] [address], [place], [place] [address]   Strep Pneumo Urinary Antigen 10/13/2021 NEGATIVE  NEGATIVE Final   Comment:        Infection due to S. pneumoniae cannot be absolutely ruled out since the antigen present may be below  the detection limit of the test. Performed at [facility], [place] [address] [place], [place] [address]    Sodium 10/14/2021 138  135 - 145 mmol/L Final   Potassium 10/14/2021 4.3  3.5 - 5.1 mmol/L Final   Chloride 10/14/2021 106  98 - 111 mmol/L Final   CO2 10/14/2021 25  22 - 32 mmol/L Final   Glucose, Bld 10/14/2021 286 (H)  70 - 99 mg/dL Final   Glucose reference range applies only to samples taken after fasting for at least 8 hours.   BUN 10/14/2021 14  8 - 23 mg/dL Final   Creatinine, Ser 10/14/2021 0.98  0.61 - 1.24 mg/dL Final   Calcium 10/14/2021 8.9  8.9 - 10.3 mg/dL Final   GFR, Estimated 10/14/2021 >60  >60  mL/min Final   Comment: (NOTE) Calculated using the CKD-EPI Creatinine Equation (2021)    Anion gap 10/14/2021 7  5 - 15 Final   Performed at [facility], [place] [address], [place], [place] [address]   WBC 10/14/2021 9.6  4.0 - 10.5 K/uL Final   RBC 10/14/2021 4.12 (L)  4.22 - 5.81 MIL/uL Final   Hemoglobin 10/14/2021 12.6 (L)  13.0 - 17.0 g/dL Final   HCT 10/14/2021 38.8 (L)  39.0 - 52.0 % Final   MCV 10/14/2021 94.2  80.0 - 100.0 fL Final   MCH 10/14/2021 30.6  26.0 - 34.0 pg Final   MCHC 10/14/2021 32.5  30.0 - 36.0 g/dL Final   RDW 10/14/2021 13.4  11.5 - 15.5 % Final   Platelets 10/14/2021 556 (H)  150 - 400 K/uL Final   nRBC 10/14/2021 0.0  0.0 - 0.2 % Final   Performed at [facility], [place] [address], [place], [place] [address]   Glucose-Capillary 10/13/2021 281 (H)  70 - 99 mg/dL Final   Glucose reference range applies only to samples taken after fasting for at least 8 hours.   Glucose-Capillary 10/13/2021 399 (H)  70 - 99 mg/dL Final   Glucose reference range applies only to samples taken after fasting for at least 8 hours.   Glucose-Capillary 10/14/2021 239 (H)  70 - 99 mg/dL Final   Glucose reference range applies only to samples taken after fasting for at least 8 hours.   Glucose-Capillary 10/14/2021 345 (H)  70 - 99 mg/dL Final   Glucose reference range applies only to samples taken after fasting for at least 8 hours.   Glucose-Capillary 10/14/2021 383 (H)  70 - 99 mg/dL Final   Glucose reference range applies only to samples taken after fasting for at least 8 hours.   Glucose-Capillary 10/15/2021 194 (H)  70 - 99 mg/dL Final   Glucose reference range applies only to samples taken after fasting for at least 8 hours.   Glucose-Capillary 10/15/2021 161 (H)  70 - 99 mg/dL Final   Glucose reference range applies only to samples taken after fasting for at least 8 hours.   Glucose-Capillary 10/15/2021 312 (H)  70 - 99 mg/dL Final   Glucose reference range applies  only to samples taken after fasting for at least 8 hours.   Glucose-Capillary 10/14/2021 279 (H)  70 - 99 mg/dL Final   Glucose reference range applies only to samples taken after fasting for at least 8 hours.  There may be more visits with results that are not included.    Blood Alcohol level:  Lab Results  Component Value Date   ETH <10 03/02/2022  ETH <10 56/38/9373    Metabolic Disorder Labs: Lab Results  Component Value Date   HGBA1C 10.4 (H) 01/21/2022   MPG 251.78 01/21/2022   MPG >398 02/24/2021   No results found for: "PROLACTIN" Lab Results  Component Value Date   CHOL 213 (H) 05/23/2021   TRIG 115 05/23/2021   HDL 71 05/23/2021   CHOLHDL 3.2 11/03/2019   VLDL 32 (H) 05/03/2015   LDLCALC 122 (H) 05/23/2021   LDLCALC 99 11/03/2019    Therapeutic Lab Levels: No results found for: "LITHIUM" No results found for: "VALPROATE" No results found for: "CBMZ"  Physical Findings   GAD-7    Flowsheet Row Office Visit from 10/08/2021 in [place] Office Visit from 09/05/2021 in [place] [facility] Office Visit from 12/26/2020 in [place] Office Visit from 11/17/2018 in [place] Office Visit from 06/05/2017 in [place]  Total GAD-7 Score _0 0 12      PHQ2-9    [place] ED from 03/02/2022 in [facility] ED from 01/21/2022 in [facility] Office Visit from 10/08/2021 in [place] Office Visit from 09/05/2021 in [place] [facility] ED from 07/02/2021 in [facility]  PHQ-2 Total Score 1 0 _1 PHQ-9 Total Score 2 -- _2 Flowsheet Row ED from 03/02/2022 in [facility] Most recent reading at 03/03/2022  1:19 AM ED from 03/01/2022 in [place] Most recent reading at 03/01/2022  5:43 PM ED from 03/01/2022 in [place] Most recent reading at 03/01/2022 12:10 AM  C-SSRS RISK CATEGORY No Risk No Risk No Risk        Musculoskeletal  Strength & Muscle Tone: within normal limits Gait & Station: normal Patient leans: N/A   Mental Status Exam   Appearance and Grooming: Patient is casually dressed. The patient has no noticeable scent or odor. Motor activity: The patient's movement speed was normal; his gait was normal. There was no notable abnormal facial movements and no notable abnormal extremity movements. Behavior: The patient appears in no acute distress, and during the interview, was calm, focused, required minimal redirection, and behaving appropriately to scenario; he was able to follow commands and compliant to requests and made good eye contact. The patient did not appear internally or externally preoccupied. Attitude: Patient's attitude towards the interviewer was cooperative. Speech: The patient's speech was clear, fluent, with good articulation, and with appropriately placed inflections. The volume of his speech was normal and normal in quantity. The rate was normal with a normal rhythm. Responses were normal in latency. There were no abnormal patterns in speech. Mood: "Doing okay" Affect: Patient's affect is euthymic with broad range and even fluctuations; his affect is congruent with his stated mood. ------------------------------------------------------------------------------------------------------------------------- Thought Content The patient experiences no hallucinations. The patient describes no delusional thoughts; he denies thought insertion, denies thought withdrawal, denies thought interruption, and denies thought broadcasting. Patient at the time of interview denies active suicidal intent and denies passive suicidal ideation; he denies homicidal  intent. Thought Process The patient's thought process is linear and is goal-directed. Insight The patient at the time of interview demonstrates fair insight Judgement The patient over the past 24 hours demonstrates fair judgement  Memory: poor  Executive Functions  Concentration: intact Attention Span: Fair Recall: intact Fund of Knowledge: fair  Alertness/Orientation: alert and answering questions appropriately  Psychomotor Activity  Psychomotor Activity: No data recorded   Assets  Assets: Communication Skills; Desire for Improvement; Resilience   Sleep  Sleep: Fair   Physical Exam  Constitutional:      Appearance: the patient is not toxic-appearing.  Pulmonary:     Effort: Pulmonary effort is normal.  Neurological:     General: No focal deficit present.     Mental Status: the patient is alert and answering questions appropriately Review of Systems  Respiratory:  Negative for shortness of breath.   Cardiovascular:  Negative for chest pain.  Gastrointestinal:  Negative for abdominal pain, constipation, diarrhea, nausea and vomiting.  Neurological:  Negative for headaches.   Blood pressure (!) 104/91, pulse 85, temperature 98.1 F (36.7 C), temperature source Oral, resp. rate 18, SpO2 98 %. There is no height or weight on file to calculate BMI.  Treatment Plan Summary: Daily contact with patient to assess and evaluate symptoms and progress in treatment and Medication management  David Wyatt is a 61 yr old male who presented to [facility] on 12/22 requesting Detox from Crack Cocaine, he was admitted to [facility] on 12/24.  PPHx is significant for Substance Induced Mood Disorder, Cocaine Use Disorder, tobacco use disorder, and no Psychiatric Hospitalizations or Suicide Attempts.  PMHx Is significant for DM2 + neuropathy, HTN, HLD, and housing instability.   [place] is tolerating detox well. He is day 6 from last use of cocaine. He denies further withdrawal symptoms. We will  not make any other changes to his medications at this time.  We will continue to monitor, especially his diabetes management. His elevated CBG was in the setting of incorrect reading on glucometer.    Substance Induced Mood Disorder: Will continue cymbalta for now but f/u Na on CMP.  -Continue Cymbalta 60 mg daily for mood disorder and neuropathy -f/u CMP   DM2-insulin-dependent with neuropathy Urge Incontinence  BPH: Suspect elevated CBGs in the setting of medication noncompliance in the outpatient setting. Will repeat CMP to check his kidney function (Cr 1.36 on admission).  -Continue sensitive SSI, will consider increasing to moderate SSI if his CBGs remain elevated today.  -Continue Novolog 3 units BID before lunch and dinner -Continue Semglee 24 Units daily -Continue Glipizide XL 10 mg daily with breakfast -Continue Metformin 1000 mg BID with meals -Continue Gabapentin 600 mg TID for neuropathy -Continue Flomax 0.8 mg daily -Will also consider diabetes consult if CBGs continue to be elevated   GERD: -Continue Pepcid 62m BID   HTN: -Continue Amlodipine 10 mg daily   Hyperlipidemia: -Continue Lipitor 40 mg daily   -Continue PRN's: Tylenol, Maalox, Atarax, Milk of Magnesia, Trazodone    Considerations for follow-up: -Recommend high risk screening every 6-12 months with HIV, RPR, hepatitis panel -PCP for insulin dependent DM2 - uncontrolled -PCP for MOCA and memory   Dispo: pending  SRolanda Lundborg MD, PGY-1 03/06/2022 9:02 AM

## 2022-03-06 NOTE — Telephone Encounter (Signed)
Copied from [place] [id number]. Topic: Appointment Scheduling - Scheduling Inquiry for Clinic >> Mar 06, 2022 12:52 PM Erskine Squibb wrote: Reason for CRM: The patient called in stating he needs to cancel his appt for tomorrow as he is in the [facility]. He just wanted his provider to know. Please assist patient further

## 2022-03-06 NOTE — ED Notes (Signed)
Pt sleeping in no acute distress. RR even and unlabored. Environment secured. Will continue to monitor for safety. 

## 2022-03-06 NOTE — Progress Notes (Signed)
LCSW received phone call back from patient's Sister [name] [phone]. Per Sister, the patient has burned every bridge with her and her family. Sister reports the patient would not be able to stay with her at discharge, and she will not be providing support to the patient anymore. Sister reports the patient is not trustworthy, and spends all of his resources and funds on substances and then calls asking for help. Sister reports her and her daughter were able to get the patient placement at the [facility] back in August. However, reports when it was time to transport the patient the patient to the agency, he was no where to be found. Sister reports she has exhausted all options with attempting to assist him, and reports she will not be doing that anymore. Sister aware of current plan to follow up with a few agencies for placement. Sister also aware that if placement is not secured then patient will likely be discharged to a shelter with resources provided. Sister is agreeable to plan and reports no concerns. Sister reports an interest in being provided a follow up once nearing discharge. No other needs were reported at this time.   David Conn, LCSW Clinical Social Worker [facility] Ph: [phone]

## 2022-03-06 NOTE — Discharge Planning (Signed)
LCSW met with patient on this morning and contacted Ronalee Belts with [employer] [phone] regarding apartment rental that was pending for patient. Per Ronalee Belts, no application was received from the patient, and they currently only have one rental spot for $450 available with two applicants already pending acceptance. Ronalee Belts reports it will be another 30-60days before another unit is available. Mike sent over the application to this Clinician's email, and application was provided to patient. Clinician reviewed requirements for rental, and patient does not meet majority of the requirements for application. LCSW provided brief supportive counseling to the patient regarding FBC process and how he would not be able to be held until placement was found for him. Patient reports he has been trying his best to locate placement prior to presenting to the [facility], however reports being told that he could not be helped. LCSW was advised by MD to follow up with the [facility] for the patient, however multiple attempts were made and LCSW received no answer. LCSW provided the patient with a list of [place], contact number to [facility], and contacted the [employer] and spoke with [place] regarding making a referral. Per Abagail Kitchens, patient is to call to complete phone screening at this time. Number provided to patient for follow up. Patient also advised LCSW to follow up with his Sister [name] regarding disposition plan, as he is fearful of committing to an agency and not making it to his court date in January. LCSW will attempt to follow up with Sister at this time.   LCSW contacted the patient's sister [name] and discussed current plans at this time. Sister expressed understanding and asked if she could follow up in about an hour to touch basis. LCSW awaiting return call back.   Per patient, he has attempted to contact [facility] with no success. Patient encouraged to continue calling and  LCSW will assist as needed.   Lucius Conn, LCSW Clinical Social Worker [place] [facility] Ph: [phone]

## 2022-03-06 NOTE — ED Notes (Signed)
Pt is in the bed sleeping. Respirations are even and unlabored. No acute distress noted. Will continue to monitor for safety. 

## 2022-03-06 NOTE — ED Notes (Signed)
Writer checked pt's blood sugar in L hand 2nd digit and received a reading of High >600. Rechecked BS in R hand 2nd digit, received reading of 280. First reading was in error due to glucometer reading was very slow in resulting. Pt received his scheduled 3 units of Novolog coverage plus 5 units of sliding scale coverage. Pt tolerated well. Re-educated pt on proper diet and consequences to his health for being non-compliant with his diet. Pt verbalized understanding. Will continue to monitor for safety.

## 2022-03-06 NOTE — ED Notes (Signed)
Pt had bedtime snack.

## 2022-03-06 NOTE — ED Notes (Signed)
Patient observed/assessed in room in bed appearing in no immediate distress resting peacefully. Q15 minute checks continued by MHT and nursing staff. Will continue to monitor and support. 

## 2022-03-07 ENCOUNTER — Ambulatory Visit: Payer: Self-pay | Admitting: Family Medicine

## 2022-03-07 ENCOUNTER — Telehealth: Payer: Self-pay | Admitting: *Deleted

## 2022-03-07 DIAGNOSIS — Z794 Long term (current) use of insulin: Secondary | ICD-10-CM | POA: Diagnosis not present

## 2022-03-07 DIAGNOSIS — F1420 Cocaine dependence, uncomplicated: Secondary | ICD-10-CM | POA: Diagnosis not present

## 2022-03-07 DIAGNOSIS — Z79899 Other long term (current) drug therapy: Secondary | ICD-10-CM | POA: Diagnosis not present

## 2022-03-07 DIAGNOSIS — F1910 Other psychoactive substance abuse, uncomplicated: Secondary | ICD-10-CM | POA: Diagnosis not present

## 2022-03-07 LAB — COMPREHENSIVE METABOLIC PANEL
ALT: 18 U/L (ref 0–44)
AST: 16 U/L (ref 15–41)
Albumin: 3 g/dL — ABNORMAL LOW (ref 3.5–5.0)
Alkaline Phosphatase: 79 U/L (ref 38–126)
Anion gap: 9 (ref 5–15)
BUN: 19 mg/dL (ref 8–23)
CO2: 26 mmol/L (ref 22–32)
Calcium: 9.2 mg/dL (ref 8.9–10.3)
Chloride: 107 mmol/L (ref 98–111)
Creatinine, Ser: 1.13 mg/dL (ref 0.61–1.24)
GFR, Estimated: >60 mL/min (ref >60)
Glucose, Bld: 148 mg/dL — ABNORMAL HIGH (ref 70–99)
Potassium: 4.3 mmol/L (ref 3.5–5.1)
Sodium: 142 mmol/L (ref 135–145)
Total Bilirubin: 0.4 mg/dL (ref 0.3–1.2)
Total Protein: 6.6 g/dL (ref 6.5–8.1)

## 2022-03-07 LAB — GLUCOSE, CAPILLARY
Glucose-Capillary: 158 mg/dL — ABNORMAL HIGH (ref 70–99)
Glucose-Capillary: 205 mg/dL — ABNORMAL HIGH (ref 70–99)
Glucose-Capillary: 90 mg/dL (ref 70–99)

## 2022-03-07 NOTE — ED Notes (Signed)
Pt sitting in dayroom in no acute distress. Denies needs at present. Environment secured. Will continue to monitor for safety.

## 2022-03-07 NOTE — Telephone Encounter (Signed)
Received

## 2022-03-07 NOTE — Discharge Planning (Signed)
LCSW and patient completed second interview with [employer] at [place] informed patient of the facility requirements and that he would have to work. 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 Patient informed the representative that he would not be able to do too much bending and standing. Agency informed that patient that he would likely not meet criteria for their program as each resident is required to work while in the program. Patient handed phone over to Education officer, museum and asked to be discharged back into the community. LCSW ended conversation with agency and thank them for their services. Patient informed LCSW that he would prefer to just be discharged back into the community instead of working at a program. Patient aware that LCSW will follow up with local shelters in the area regarding availability. Numbers were also provided to the patient for his follow up. LCSW attempted to contact [place], and [employer], however received no answer. LCSW received a phone call from patient's sister regarding update. LCSW informed sister of conversation with [place] and next steps for discharge. Sister expressed understanding and asked if patient has followed up with [facility]. Sister aware that [facility] contact information was provided to patient upon admission into the [facility], and LCSW was unsure if he has called to complete phone screening. Patient encouraged to do so. Patient informed this Clinician that his biggest concern was that he did not want to lose his SS disability, even though [facility] informed him that he would not lose his income. Patient reports he will continue to call around to the local shelters and asked if he could be discharged earlier in the day so that he has time to get around. MD made aware. No other needs to report at this time.   David Conn, LCSW Clinical Social Worker [place]  [facility] Ph: [phone]

## 2022-03-07 NOTE — Telephone Encounter (Signed)
Transition Care Management Unsuccessful Follow-up Telephone Call  Date of discharge and from where:  03/02/22 [facility]  Attempts:  2nd Attempt  Reason for unsuccessful TCM follow-up call:  Left voice message

## 2022-03-07 NOTE — ED Notes (Signed)
Pt FSBS 90. Pt didn't receive his scheduled 3 units Novolog insulin per Beatriz Stallion, NP, due to low reading, which was checked after pt had already started eating his meal. Pt was in agreement and verbalized understanding of the decision made. Safety maintained.

## 2022-03-07 NOTE — Discharge Instructions (Addendum)
Sober Living Resources   [facility] [address], [place], [place], [address] [phone] phone [phone] fax NOTE: Does have Substance Abuse-Intensive Outpatient Program (SAIOP) as well as transitional housing if eligible.  [facility] [address] [place], [place] [address] [phone] Male and Male facility; [phone]; Residential Program; no additional fees; does not matter about insurance or uninsured; Will need to complete a phone screening; be able to work 40 hours a week; no prior sex offenders; no car for the first year of the program; 4 church services a week; receiving any income: may have to put 30% towards stay;  [facility]: Substance Use Transitional Living [phone]  [facility] [address] [place], [place] [address] [phone]  [facility] in [place], [place]: Front Desk Staff: Reeci [phone]; They have a Men's Regenerations Program; The programs are 6-9months. There is an initial $300 fee however, they are willing to work with patients regarding that.   Shelters  [facility] [address], [place], [place] [address] [phone] Population served: Adult men & women (18 years old and older, able to perform activities for daily living) Documents required: Valid ID & Social Security Card  [facility] [address], [place], [place] [address] [phone] Population served: Males 18+ Documents required: Valid ID & Social Security Card  [facility] [place] [address], [place], [place] [address] [phone] Population Served: Families with children, adult women, and adult men.  [facility] Address: [address], [place], [place] [address] Phone: [phone]  [facility] Address: [address], [place], [place] [address] Closes 5?PM Phone: [phone]  Follow-up recommendations:   Activity:  Normal, as tolerated Diet:  Per PCP recommendation  Patient is instructed prior to discharge to: Take all medications as prescribed by his mental healthcare provider. Report any adverse effects and/or reactions from the medicines to his outpatient provider promptly. Patient has been instructed & cautioned: To not engage in alcohol and or illegal drug use while on prescription medicines.  In the event of worsening symptoms, patient is instructed to call the crisis hotline at 988, 911 and or go to the nearest ED for appropriate evaluation and treatment of symptoms. To follow-up with his primary care provider for your other medical issues, concerns and or health care needs.   Naloxone (Narcan) can help reverse an overdose when given to the victim quickly.  [place] offers free naloxone kits and instructions/training on its use.  Add naloxone to your first aid kit and you can help save a life.   Pick up your free kit at the following locations.   [place]:  [facility], [address] [place] [address] ([phone]) [facility] [address] [place] [id number] ([phone]) [facility] [address] [place] [address]  [place]: [facility] [address] [place] [address] ([phone]) [facility] [address] [place] [address] ([phone])  

## 2022-03-07 NOTE — Inpatient Diabetes Management (Signed)
Inpatient Diabetes Program Recommendations  AACE/ADA: New Consensus Statement on Inpatient Glycemic Control (2015)  Target Ranges:  Prepandial:   less than 140 mg/dL      Peak postprandial:   less than 180 mg/dL (1-2 hours)      Critically ill patients:  140 - 180 mg/dL   Lab Results  Component Value Date   GLUCAP 158 (H) 03/07/2022   HGBA1C 10.4 (H) 01/21/2022    Review of Glycemic Control  Diabetes history: DM2 Outpatient Diabetes medications: Lantus 24 units QD, Novolog 3 units BID at lunch and dinner meal, metformin 1000 mg BID, glipizide 10 mg QD Current orders for Inpatient glycemic control: Semglee 24 units QD, Novolog 0-9 units TID + 3 units BID at lunch and dinner meal, metformin 1000 mg BID, glipizide 10 mg QAM  HgbA1C - 10.4%  Inpatient Diabetes Program Recommendations:    Increase Novolog 3 units TID with meals (add 3 units at breakfast)  Continue to follow glucose trends.  Awaiting placement.  Thank you. Lorenda Peck, RD, LDN, [place] Inpatient Diabetes Coordinator [phone]

## 2022-03-07 NOTE — ED Notes (Signed)
Patient observed/assessed in room in bed appearing in no immediate distress resting peacefully. Q15 minute checks continued by MHT and nursing staff. Will continue to monitor and support. 

## 2022-03-07 NOTE — ED Provider Notes (Signed)
Behavioral Health Progress Note  Date and Time: 03/07/2022 10:22 AM Name: David Wyatt MRN:  [id number]  Subjective:   David Wyatt is a 61 yr old male who presented to [facility] on 12/22 requesting Detox from Crack Cocaine, he was admitted to [facility] on 12/24.  PPHx is significant for Substance Induced Mood Disorder, Cocaine Use Disorder, tobacco use disorder, and no Psychiatric Hospitalizations or Suicide Attempts.  PMHx Is significant for DM2 + neuropathy, HTN, HLD, and housing instability.   Patient reports that his mood is better. Patient's last cocaine use was 12/21. Denies any physical withdrawal symptoms. Reports L foot pain, this was evaluated by this provider and it appears that there is redness from patient's foot rubbing against his shoe. Discussed that we could find some looser shoes for him. Denies any side effects to the medications. Discussed we will continue to monitor his glucose levels. Continues to report his good sleep and appetite. He denies SI/HI/AVH. Reports attempting to call halfway houses yesterday but states no one picked up. States he will attempt again today.    Diagnosis:  Final diagnoses:  Substance abuse ([place])  Cocaine use disorder ([place])    Total Time spent with patient: 30 minutes  Past Psychiatric History: Substance Induced Mood Disorder, Cocaine Use Disorder, tobacco use disorder, and no Psychiatric Hospitalizations or Suicide Attempts.   Past Medical History:  Past Medical History:  Diagnosis Date   Angioedema of lips 07/28/2012   left upper (07/29/2012)   Arthritis    hands and back   Chronic back pain    Cocaine abuse (HCC)    Diabetic neuropathy (HCC)    High cholesterol    Hypertension    Neuropathy    Type II diabetes mellitus ([place])     Past Surgical History:  Procedure Laterality Date   BACK SURGERY     CYSTOSCOPY W/ URETERAL STENT PLACEMENT Right 02/23/2021   Procedure: CYSTOSCOPY WITH RETROGRADE PYELOGRAM/URETERAL STENT PLACEMENT;  Surgeon:  Janith Lima, MD;  Location: [facility];  Service: Urology;  Laterality: Right;   HERNIA REPAIR Right 04/01/2012   I & D EXTREMITY Left 12/13/2012   Procedure: IRRIGATION AND DEBRIDEMENT LEFT THUMB;  Surgeon: Tennis Must, MD;  Location: [place];  Service: Orthopedics;  Laterality: Left;   INCISION AND DRAINAGE OF WOUND     boil on back/notes 07/14/2008 (07/29/2012)   INGUINAL HERNIA REPAIR  04/01/2012   Procedure: HERNIA REPAIR INGUINAL ADULT;  Surgeon: Haywood Lasso, MD;  Location: [place];  Service: General;  Laterality: Right;   INGUINAL HERNIA REPAIR Left 09/06/2016   Procedure: OPEN REPAIR LEFT INGUINAL HERNIA;  Surgeon: Greer Pickerel, MD;  Location: [place];  Service: General;  Laterality: Left;   INSERTION OF MESH Left 09/06/2016   Procedure: INSERTION OF MESH;  Surgeon: Greer Pickerel, MD;  Location: [place];  Service: General;  Laterality: Left;   TONSILLECTOMY     Family History:  Family History  Problem Relation Age of Onset   Diabetes Mother    Kidney disease Mother    Hyperlipidemia Mother    Hyperlipidemia Father    Diabetes Father    Family Psychiatric  History: None Reported  Social History:  Social History   Substance and Sexual Activity  Alcohol Use No   Alcohol/week: 0.0 standard drinks of alcohol     Social History   Substance and Sexual Activity  Drug Use Not Currently   Types: "Crack" cocaine    Social History   Socioeconomic History  Marital status: Divorced    Spouse name: Not on file   Number of children: 3   Years of education: 10   Highest education level: Not on file  Occupational History    Comment: disabled  Tobacco Use   Smoking status: Every Day    Packs/day: 0.25    Years: 30.00    Total pack years: 7.50    Types: Cigarettes   Smokeless tobacco: Former    Quit date: 08/15/2015   Tobacco comments:    04/29/16 2  cigs daily, 10/27/17 sometimes < .25 PPD  Vaping Use   Vaping Use: Never used  Substance and Sexual Activity   Alcohol use: No     Alcohol/week: 0.0 standard drinks of alcohol   Drug use: Not Currently    Types: "Crack" cocaine   Sexual activity: Not on file  Other Topics Concern   Not on file  Social History Narrative   04/29/17   Lives in shelter   Caffeine- a lot of  tea, coffee   Social Determinants of Health   Financial Resource Strain: Not on file  Food Insecurity: Not on file  Transportation Needs: Not on file  Physical Activity: Not on file  Stress: Not on file  Social Connections: Not on file   SDOH:  SDOH Screenings   Depression (PHQ2-9): Low Risk  (03/06/2022)  Recent Concern: Depression (PHQ2-9) - Medium Risk (03/03/2022)  Tobacco Use: High Risk (03/01/2022)   Additional Social History:    Pain Medications: See MAR Prescriptions: See MAR Over the Counter: See MAR History of alcohol / drug use?: Yes Longest period of sobriety (when/how long): Hx of crack cocaine use - clean 2-3 months now Negative Consequences of Use: Financial Withdrawal Symptoms: None Name of Substance 1: Cocaine (crack) 1 - Age of First Use: 20s 1 - Amount (size/oz): Varies 1 - Frequency: Daily when available 1 - Duration: Ongoing 1 - Last Use / Amount: 1 month ago 1 - Method of Aquiring: Unknown 1- Route of Use: Smoke inhalation                  Sleep: "beautiful"  Appetite:  fair  Current Medications:  Current Facility-Administered Medications  Medication Dose Route Frequency Provider Last Rate Last Admin   acetaminophen (TYLENOL) tablet 650 mg  650 mg Oral Q6H PRN Bobbitt, Shalon E, NP   650 mg at 03/06/22 2110   alum & mag hydroxide-simeth (MAALOX/MYLANTA) 200-200-20 MG/5ML suspension 30 mL  30 mL Oral Q4H PRN Bobbitt, Shalon E, NP       amLODipine (NORVASC) tablet 10 mg  10 mg Oral Daily Bobbitt, Shalon E, NP   10 mg at 03/07/22 0901   atorvastatin (LIPITOR) tablet 40 mg  40 mg Oral Daily Bobbitt, Shalon E, NP   40 mg at 03/07/22 0901   DULoxetine (CYMBALTA) DR capsule 60 mg  60 mg Oral Daily  Bobbitt, Shalon E, NP   60 mg at 03/07/22 0900   famotidine (PEPCID) tablet 10 mg  10 mg Oral BID Bobbitt, Shalon E, NP   10 mg at 03/07/22 0901   gabapentin (NEURONTIN) capsule 600 mg  600 mg Oral TID Bobbitt, Shalon E, NP   600 mg at 03/07/22 0900   glipiZIDE (GLUCOTROL XL) 24 hr tablet 10 mg  10 mg Oral Q breakfast Bobbitt, Shalon E, NP   10 mg at 03/07/22 0900   hydrOXYzine (ATARAX) tablet 25 mg  25 mg Oral TID PRN Bobbitt, Lennie Muckle, NP  25 mg at 03/06/22 2109   insulin aspart (novoLOG) injection 0-9 Units  0-9 Units Subcutaneous TID WC Briant Cedar, MD   2 Units at 03/07/22 0815   insulin aspart (novoLOG) injection 3 Units  3 Units Subcutaneous BID AC Bobbitt, Shalon E, NP   3 Units at 03/06/22 1704   insulin glargine-yfgn (SEMGLEE) injection 24 Units  24 Units Subcutaneous Daily [place] Abbot, MD   24 Units at 03/07/22 0905   loperamide (IMODIUM) capsule 2-4 mg  2-4 mg Oral PRN Briant Cedar, MD       magnesium hydroxide (MILK OF MAGNESIA) suspension 30 mL  30 mL Oral Daily PRN Bobbitt, Shalon E, NP       metFORMIN (GLUCOPHAGE) tablet 1,000 mg  1,000 mg Oral BID WC Bobbitt, Shalon E, NP   1,000 mg at 03/07/22 0900   tamsulosin (FLOMAX) capsule 0.8 mg  0.8 mg Oral Daily Bobbitt, Shalon E, NP   0.8 mg at 03/07/22 0901   traZODone (DESYREL) tablet 100 mg  100 mg Oral QHS PRN Bobbitt, Shalon E, NP   100 mg at 03/06/22 2109   Current Outpatient Medications  Medication Sig Dispense Refill   glipiZIDE (GLUCOTROL) 5 MG tablet Take 10 mg by mouth every morning.     amLODipine (NORVASC) 10 MG tablet Take 1 tablet (10 mg total) by mouth daily. 30 tablet 0   atorvastatin (LIPITOR) 40 MG tablet Take 1 tablet (40 mg total) by mouth daily. 30 tablet 0   Blood Glucose Monitoring Suppl (ACCU-CHEK GUIDE) w/Device KIT USE AS DIRECTED TO CHECK BLOOD SUGARS UP TO TWICE PER DAY 1 kit 0   gabapentin (NEURONTIN) 300 MG capsule Take 2 capsules (600 mg total) by mouth 3 (three) times daily. 180  capsule 0   insulin aspart (NOVOLOG) 100 UNIT/ML injection Inject 3 Units into the skin 2 (two) times daily before lunch and supper. 10 mL 0   Insulin Glargine Solostar (LANTUS) 100 UNIT/ML Solostar Pen Inject 24 Units into the skin daily. 15 mL 2   metFORMIN (GLUCOPHAGE) 1000 MG tablet Take 1 tablet (1,000 mg total) by mouth 2 (two) times daily. 60 tablet 0   tamsulosin (FLOMAX) 0.4 MG CAPS capsule Take 2 capsules (0.8 mg total) by mouth daily. For renal calculi 60 capsule 0   traZODone (DESYREL) 100 MG tablet Take 1 tablet (100 mg total) by mouth at bedtime as needed for sleep. 15 tablet 0    Labs  Lab Results:  Admission on 03/02/2022  Component Date Value Ref Range Status   TSH 03/02/2022 0.735  0.350 - 4.500 uIU/mL Final   Comment: Performed by a 3rd Generation assay with a functional sensitivity of <=0.01 uIU/mL. Performed at [facility], [place] [address], [place], [place] [address]    Glucose-Capillary 03/03/2022 281 (H)  70 - 99 mg/dL Final   Glucose reference range applies only to samples taken after fasting for at least 8 hours.   Glucose-Capillary 03/03/2022 147 (H)  70 - 99 mg/dL Final   Glucose reference range applies only to samples taken after fasting for at least 8 hours.   Glucose-Capillary 03/03/2022 70  70 - 99 mg/dL Final   Glucose reference range applies only to samples taken after fasting for at least 8 hours.   Glucose-Capillary 03/03/2022 134 (H)  70 - 99 mg/dL Final   Glucose reference range applies only to samples taken after fasting for at least 8 hours.   Glucose-Capillary 03/03/2022 135 (H)  70 - 99 mg/dL Final   Glucose reference range applies only to samples taken after fasting for at least 8 hours.   Glucose-Capillary 03/04/2022 174 (H)  70 - 99 mg/dL Final   Glucose reference range applies only to samples taken after fasting for at least 8 hours.   Glucose-Capillary 03/04/2022 176 (H)  70 - 99 mg/dL Final   Glucose reference range applies only to  samples taken after fasting for at least 8 hours.   Glucose-Capillary 03/04/2022 224 (H)  70 - 99 mg/dL Final   Glucose reference range applies only to samples taken after fasting for at least 8 hours.   Glucose-Capillary 03/05/2022 169 (H)  70 - 99 mg/dL Final   Glucose reference range applies only to samples taken after fasting for at least 8 hours.   Glucose-Capillary 03/03/2022 360 (H)  70 - 99 mg/dL Final   Glucose reference range applies only to samples taken after fasting for at least 8 hours.   Glucose-Capillary 03/04/2022 225 (H)  70 - 99 mg/dL Final   Glucose reference range applies only to samples taken after fasting for at least 8 hours.   Glucose-Capillary 03/05/2022 182 (H)  70 - 99 mg/dL Final   Glucose reference range applies only to samples taken after fasting for at least 8 hours.   Glucose-Capillary 03/05/2022 230 (H)  70 - 99 mg/dL Final   Glucose reference range applies only to samples taken after fasting for at least 8 hours.   Glucose-Capillary 03/05/2022 220 (H)  70 - 99 mg/dL Final   Glucose reference range applies only to samples taken after fasting for at least 8 hours.   Glucose-Capillary 03/06/2022 148 (H)  70 - 99 mg/dL Final   Glucose reference range applies only to samples taken after fasting for at least 8 hours.   Glucose-Capillary 03/06/2022 >600 (HH)  70 - 99 mg/dL Final   Comment: Glucose reference range applies only to samples taken after fasting for at least 8 hours. REPEATED TO VERIFY Performed at [facility], [place] [address], [place], [place] [address]    Glucose-Capillary 03/06/2022 280 (H)  70 - 99 mg/dL Final   Glucose reference range applies only to samples taken after fasting for at least 8 hours.   Sodium 03/07/2022 142  135 - 145 mmol/L Final   Potassium 03/07/2022 4.3  3.5 - 5.1 mmol/L Final   Chloride 03/07/2022 107  98 - 111 mmol/L Final   CO2 03/07/2022 26  22 - 32 mmol/L Final   Glucose, Bld 03/07/2022 148 (H)  70 - 99 mg/dL Final    Glucose reference range applies only to samples taken after fasting for at least 8 hours.   BUN 03/07/2022 19  8 - 23 mg/dL Final   Creatinine, Ser 03/07/2022 1.13  0.61 - 1.24 mg/dL Final   Calcium 03/07/2022 9.2  8.9 - 10.3 mg/dL Final   Total Protein 03/07/2022 6.6  6.5 - 8.1 g/dL Final   Albumin 03/07/2022 3.0 (L)  3.5 - 5.0 g/dL Final   AST 03/07/2022 16  15 - 41 U/L Final   ALT 03/07/2022 18  0 - 44 U/L Final   Alkaline Phosphatase 03/07/2022 79  38 - 126 U/L Final   Total Bilirubin 03/07/2022 0.4  0.3 - 1.2 mg/dL Final   GFR, Estimated 03/07/2022 >60  >60 mL/min Final   Comment: (NOTE) Calculated using the CKD-EPI Creatinine Equation (2021)    Anion gap 03/07/2022 9  5 - 15  Final   Performed at [facility], [place] [address] [place], [place] [address]   Glucose-Capillary 03/06/2022 151 (H)  70 - 99 mg/dL Final   Glucose reference range applies only to samples taken after fasting for at least 8 hours.   Glucose-Capillary 03/07/2022 158 (H)  70 - 99 mg/dL Final   Glucose reference range applies only to samples taken after fasting for at least 8 hours.  Admission on 03/01/2022, Discharged on 03/02/2022  Component Date Value Ref Range Status   WBC 03/02/2022 6.5  4.0 - 10.5 K/uL Final   RBC 03/02/2022 4.21 (L)  4.22 - 5.81 MIL/uL Final   Hemoglobin 03/02/2022 12.8 (L)  13.0 - 17.0 g/dL Final   HCT 03/02/2022 40.2  39.0 - 52.0 % Final   MCV 03/02/2022 95.5  80.0 - 100.0 fL Final   MCH 03/02/2022 30.4  26.0 - 34.0 pg Final   MCHC 03/02/2022 31.8  30.0 - 36.0 g/dL Final   RDW 03/02/2022 14.2  11.5 - 15.5 % Final   Platelets 03/02/2022 372  150 - 400 K/uL Final   nRBC 03/02/2022 0.0  0.0 - 0.2 % Final   Neutrophils Relative % 03/02/2022 60  % Final   Neutro Abs 03/02/2022 4.0  1.7 - 7.7 K/uL Final   Lymphocytes Relative 03/02/2022 26  % Final   Lymphs Abs 03/02/2022 1.7  0.7 - 4.0 K/uL Final   Monocytes Relative 03/02/2022 8  % Final   Monocytes Absolute 03/02/2022 0.5   0.1 - 1.0 K/uL Final   Eosinophils Relative 03/02/2022 4  % Final   Eosinophils Absolute 03/02/2022 0.2  0.0 - 0.5 K/uL Final   Basophils Relative 03/02/2022 1  % Final   Basophils Absolute 03/02/2022 0.0  0.0 - 0.1 K/uL Final   Immature Granulocytes 03/02/2022 1  % Final   Abs Immature Granulocytes 03/02/2022 0.03  0.00 - 0.07 K/uL Final   Performed at [facility], [place] [address] [place], [place] [address]   Sodium 03/02/2022 130 (L)  135 - 145 mmol/L Final   Potassium 03/02/2022 4.4  3.5 - 5.1 mmol/L Final   Chloride 03/02/2022 96 (L)  98 - 111 mmol/L Final   CO2 03/02/2022 24  22 - 32 mmol/L Final   Glucose, Bld 03/02/2022 709 (HH)  70 - 99 mg/dL Final   Comment: CRITICAL RESULT CALLED TO, READ BACK BY AND VERIFIED WITH J. NEWTON RN 03/02/22 _0  BY J. WHITE Glucose reference range applies only to samples taken after fasting for at least 8 hours.    BUN 03/02/2022 16  8 - 23 mg/dL Final   Creatinine, Ser 03/02/2022 1.36 (H)  0.61 - 1.24 mg/dL Final   Calcium 03/02/2022 8.7 (L)  8.9 - 10.3 mg/dL Final   Total Protein 03/02/2022 6.5  6.5 - 8.1 g/dL Final   Albumin 03/02/2022 3.0 (L)  3.5 - 5.0 g/dL Final   AST 03/02/2022 35  15 - 41 U/L Final   ALT 03/02/2022 22  0 - 44 U/L Final   Alkaline Phosphatase 03/02/2022 92  38 - 126 U/L Final   Total Bilirubin 03/02/2022 0.2 (L)  0.3 - 1.2 mg/dL Final   GFR, Estimated 03/02/2022 59 (L)  >60 mL/min Final   Comment: (NOTE) Calculated using the CKD-EPI Creatinine Equation (2021)    Anion gap 03/02/2022 10  5 - 15 Final   Performed at [place] [address] [place], [place] [address]   Alcohol, Ethyl (  B) 03/02/2022 <10  <10 mg/dL Final   Comment: (NOTE) Lowest detectable limit for serum alcohol is 10 mg/dL.  For medical purposes only. Performed at [facility], [place] [address] [place], [place] [address]    Opiates 03/02/2022 NONE DETECTED  NONE DETECTED Final   Cocaine 03/02/2022 POSITIVE (A)  NONE DETECTED  Final   Benzodiazepines 03/02/2022 NONE DETECTED  NONE DETECTED Final   Amphetamines 03/02/2022 NONE DETECTED  NONE DETECTED Final   Tetrahydrocannabinol 03/02/2022 NONE DETECTED  NONE DETECTED Final   Barbiturates 03/02/2022 NONE DETECTED  NONE DETECTED Final   Comment: (NOTE) DRUG SCREEN FOR MEDICAL PURPOSES ONLY.  IF CONFIRMATION IS NEEDED FOR ANY PURPOSE, NOTIFY LAB WITHIN 5 DAYS.  LOWEST DETECTABLE LIMITS FOR URINE DRUG SCREEN Drug Class                     Cutoff (ng/mL) Amphetamine and metabolites    1000 Barbiturate and metabolites    200 Benzodiazepine                 200 Opiates and metabolites        300 Cocaine and metabolites        300 THC                            50 Performed at [facility], [place] [address] [place], [place] [address]    Color, Urine 03/02/2022 STRAW (A)  YELLOW Final   APPearance 03/02/2022 HAZY (A)  CLEAR Final   Specific Gravity, Urine 03/02/2022 1.023  1.005 - 1.030 Final   pH 03/02/2022 6.0  5.0 - 8.0 Final   Glucose, UA 03/02/2022 >=500 (A)  NEGATIVE mg/dL Final   Hgb urine dipstick 03/02/2022 NEGATIVE  NEGATIVE Final   Bilirubin Urine 03/02/2022 NEGATIVE  NEGATIVE Final   Ketones, ur 03/02/2022 NEGATIVE  NEGATIVE mg/dL Final   Protein, ur 03/02/2022 NEGATIVE  NEGATIVE mg/dL Final   Nitrite 03/02/2022 NEGATIVE  NEGATIVE Final   Leukocytes,Ua 03/02/2022 LARGE (A)  NEGATIVE Final   RBC / HPF 03/02/2022 0-5  0 - 5 RBC/hpf Final   WBC, UA 03/02/2022 >50 (H)  0 - 5 WBC/hpf Final   Bacteria, UA 03/02/2022 RARE (A)  NONE SEEN Final   Squamous Epithelial / LPF 03/02/2022 0-5  0 - 5 Final   WBC Clumps 03/02/2022 PRESENT   Final   Mucus 03/02/2022 PRESENT   Final   Budding Yeast 03/02/2022 PRESENT   Final   Performed at [facility], [place] [address] [place], [place] [address]   SARS Coronavirus 2 by RT PCR 03/02/2022 NEGATIVE  NEGATIVE Final   Comment: (NOTE) SARS-CoV-2 target nucleic acids are NOT DETECTED.  The SARS-CoV-2 RNA  is generally detectable in upper respiratory specimens during the acute phase of infection. The lowest concentration of SARS-CoV-2 viral copies this assay can detect is 138 copies/mL. A negative result does not preclude SARS-Cov-2 infection and should not be used as the sole basis for treatment or other patient management decisions. A negative result may occur with  improper specimen collection/handling, submission of specimen other than nasopharyngeal swab, presence of viral mutation(s) within the areas targeted by this assay, and inadequate number of viral copies(<138 copies/mL). A negative result must be combined with clinical observations, patient history, and epidemiological information. The expected result is Negative.  Fact Sheet for Patients:  EntrepreneurPulse.com.au  Fact Sheet for Healthcare Providers:  IncredibleEmployment.be  This test is no                          t yet approved or cleared by the [place] and  has been authorized for detection and/or diagnosis of SARS-CoV-2 by FDA under an Emergency Use Authorization (EUA). This EUA will remain  in effect (meaning this test can be used) for the duration of the COVID-19 declaration under Section 564(b)(1) of the Act, 21 U.S.C.section 360bbb-3(b)(1), unless the authorization is terminated  or revoked sooner.       Influenza A by PCR 03/02/2022 NEGATIVE  NEGATIVE Final   Influenza B by PCR 03/02/2022 NEGATIVE  NEGATIVE Final   Comment: (NOTE) The Xpert Xpress SARS-CoV-2/FLU/RSV plus assay is intended as an aid in the diagnosis of influenza from Nasopharyngeal swab specimens and should not be used as a sole basis for treatment. Nasal washings and aspirates are unacceptable for Xpert Xpress SARS-CoV-2/FLU/RSV testing.  Fact Sheet for Patients: EntrepreneurPulse.com.au  Fact Sheet for Healthcare Providers: IncredibleEmployment.be  This  test is not yet approved or cleared by the [place] FDA and has been authorized for detection and/or diagnosis of SARS-CoV-2 by FDA under an Emergency Use Authorization (EUA). This EUA will remain in effect (meaning this test can be used) for the duration of the COVID-19 declaration under Section 564(b)(1) of the Act, 21 U.S.C. section 360bbb-3(b)(1), unless the authorization is terminated or revoked.     Resp Syncytial Virus by PCR 03/02/2022 NEGATIVE  NEGATIVE Final   Comment: (NOTE) Fact Sheet for Patients: EntrepreneurPulse.com.au  Fact Sheet for Healthcare Providers: IncredibleEmployment.be  This test is not yet approved or cleared by the [place] FDA and has been authorized for detection and/or diagnosis of SARS-CoV-2 by FDA under an Emergency Use Authorization (EUA). This EUA will remain in effect (meaning this test can be used) for the duration of the COVID-19 declaration under Section 564(b)(1) of the Act, 21 U.S.C. section 360bbb-3(b)(1), unless the authorization is terminated or revoked.  Performed at [facility], [place] [address], [place], [place] [address]    Specimen Description 03/02/2022 URINE, CLEAN CATCH   Final   Special Requests 03/02/2022 NONE   Final   Culture 03/02/2022  (A)   Final                   Value:<10,000 COLONIES/mL INSIGNIFICANT GROWTH Performed at [facility], [place] [address] [place], [place] [address]    Report Status 03/02/2022 03/04/2022 FINAL   Final   Glucose-Capillary 03/02/2022 >600 (HH)  70 - 99 mg/dL Final   Glucose reference range applies only to samples taken after fasting for at least 8 hours.   Comment 1 03/02/2022 Document in Chart   Final   Glucose-Capillary 03/02/2022 241 (H)  70 - 99 mg/dL Final   Glucose reference range applies only to samples taken after fasting for at least 8 hours.   Comment 1 03/02/2022 Document in Chart   Final   Glucose-Capillary 03/02/2022 390  (H)  70 - 99 mg/dL Final   Glucose reference range applies only to samples taken after fasting for at least 8 hours.  Admission on 03/01/2022, Discharged on 03/01/2022  Component Date Value Ref Range Status   Glucose-Capillary 03/01/2022 289 (H)  70 - 99 mg/dL Final   Glucose reference range applies only to samples taken after fasting for at least 8 hours.  Admission on 01/21/2022, Discharged on 02/06/2022  Component  Date Value Ref Range Status   Hgb A1c MFr Bld 01/21/2022 10.4 (H)  4.8 - 5.6 % Final   Comment: (NOTE) Pre diabetes:          5.7%-6.4%  Diabetes:              >6.4%  Glycemic control for   <7.0% adults with diabetes    Mean Plasma Glucose 01/21/2022 251.78  mg/dL Final   Performed at [facility] [address], [place], [place] [address]   TSH 01/21/2022 0.248 (L)  0.350 - 4.500 uIU/mL Final   Comment: Performed by a 3rd Generation assay with a functional sensitivity of <=0.01 uIU/mL. Performed at [facility], [place] [address], [place], [place] [address]    Glucose-Capillary 01/21/2022 374 (H)  70 - 99 mg/dL Final   Glucose reference range applies only to samples taken after fasting for at least 8 hours.   Glucose-Capillary 01/21/2022 289 (H)  70 - 99 mg/dL Final   Glucose reference range applies only to samples taken after fasting for at least 8 hours.   Glucose-Capillary 01/22/2022 137 (H)  70 - 99 mg/dL Final   Glucose reference range applies only to samples taken after fasting for at least 8 hours.   Glucose-Capillary 01/22/2022 297 (H)  70 - 99 mg/dL Final   Glucose reference range applies only to samples taken after fasting for at least 8 hours.   T3, Free 01/23/2022 2.2  2.0 - 4.4 pg/mL Final   Comment: (NOTE) Performed At: [facility] [place], [place] [id number] Rush Farmer MD QQ:[id number]    Glucose-Capillary 01/22/2022 94  70 - 99 mg/dL Final   Glucose reference range applies only to samples taken after fasting for  at least 8 hours.   Free T4 01/23/2022 0.72  0.61 - 1.12 ng/dL Final   Comment: (NOTE) Biotin ingestion may interfere with free T4 tests. If the results are inconsistent with the TSH level, previous test results, or the clinical presentation, then consider biotin interference. If needed, order repeat testing after stopping biotin. Performed at [facility], [place] [address] [place], [place] [address]    TSH 01/23/2022 0.203 (L)  0.350 - 4.500 uIU/mL Final   Comment: Performed by a 3rd Generation assay with a functional sensitivity of <=0.01 uIU/mL. Performed at [facility], [place] [address], [place], [place] [address]    Glucose-Capillary 01/23/2022 132 (H)  70 - 99 mg/dL Final   Glucose reference range applies only to samples taken after fasting for at least 8 hours.   Glucose-Capillary 01/22/2022 59 (L)  70 - 99 mg/dL Final   Glucose reference range applies only to samples taken after fasting for at least 8 hours.   Glucose-Capillary 01/22/2022 177 (H)  70 - 99 mg/dL Final   Glucose reference range applies only to samples taken after fasting for at least 8 hours.   Glucose-Capillary 01/23/2022 284 (H)  70 - 99 mg/dL Final   Glucose reference range applies only to samples taken after fasting for at least 8 hours.   Glucose-Capillary 01/23/2022 188 (H)  70 - 99 mg/dL Final   Glucose reference range applies only to samples taken after fasting for at least 8 hours.   Glucose-Capillary 01/23/2022 294 (H)  70 - 99 mg/dL Final   Glucose reference range applies only to samples taken after fasting for at least 8 hours.   Glucose-Capillary 01/24/2022 180 (H)  70 - 99 mg/dL  Final   Glucose reference range applies only to samples taken after fasting for at least 8 hours.   Glucose-Capillary 01/24/2022 275 (H)  70 - 99 mg/dL Final   Glucose reference range applies only to samples taken after fasting for at least 8 hours.   Glucose-Capillary 01/24/2022 146 (H)  70 - 99 mg/dL Final    Glucose reference range applies only to samples taken after fasting for at least 8 hours.   Glucose-Capillary 01/24/2022 137 (H)  70 - 99 mg/dL Final   Glucose reference range applies only to samples taken after fasting for at least 8 hours.   Glucose-Capillary 01/25/2022 201 (H)  70 - 99 mg/dL Final   Glucose reference range applies only to samples taken after fasting for at least 8 hours.   Glucose-Capillary 01/25/2022 195 (H)  70 - 99 mg/dL Final   Glucose reference range applies only to samples taken after fasting for at least 8 hours.   Glucose-Capillary 01/25/2022 149 (H)  70 - 99 mg/dL Final   Glucose reference range applies only to samples taken after fasting for at least 8 hours.   Glucose-Capillary 01/25/2022 147 (H)  70 - 99 mg/dL Final   Glucose reference range applies only to samples taken after fasting for at least 8 hours.   Glucose-Capillary 01/26/2022 123 (H)  70 - 99 mg/dL Final   Glucose reference range applies only to samples taken after fasting for at least 8 hours.   Glucose-Capillary 01/26/2022 176 (H)  70 - 99 mg/dL Final   Glucose reference range applies only to samples taken after fasting for at least 8 hours.   Glucose-Capillary 01/26/2022 84  70 - 99 mg/dL Final   Glucose reference range applies only to samples taken after fasting for at least 8 hours.   Glucose-Capillary 01/26/2022 177 (H)  70 - 99 mg/dL Final   Glucose reference range applies only to samples taken after fasting for at least 8 hours.   Glucose-Capillary 01/27/2022 150 (H)  70 - 99 mg/dL Final   Glucose reference range applies only to samples taken after fasting for at least 8 hours.   Glucose-Capillary 01/27/2022 171 (H)  70 - 99 mg/dL Final   Glucose reference range applies only to samples taken after fasting for at least 8 hours.   Glucose-Capillary 01/27/2022 159 (H)  70 - 99 mg/dL Final   Glucose reference range applies only to samples taken after fasting for at least 8 hours.    Glucose-Capillary 01/27/2022 67 (L)  70 - 99 mg/dL Final   Glucose reference range applies only to samples taken after fasting for at least 8 hours.   Glucose-Capillary 01/27/2022 107 (H)  70 - 99 mg/dL Final   Glucose reference range applies only to samples taken after fasting for at least 8 hours.   Glucose-Capillary 01/27/2022 144 (H)  70 - 99 mg/dL Final   Glucose reference range applies only to samples taken after fasting for at least 8 hours.   Glucose-Capillary 01/28/2022 175 (H)  70 - 99 mg/dL Final   Glucose reference range applies only to samples taken after fasting for at least 8 hours.   Glucose-Capillary 01/28/2022 198 (H)  70 - 99 mg/dL Final   Glucose reference range applies only to samples taken after fasting for at least 8 hours.   Glucose-Capillary 01/28/2022 186 (H)  70 - 99 mg/dL Final   Glucose reference range applies only to samples taken after fasting for at least 8 hours.  Glucose-Capillary 01/28/2022 198 (H)  70 - 99 mg/dL Final   Glucose reference range applies only to samples taken after fasting for at least 8 hours.   Glucose-Capillary 01/29/2022 111 (H)  70 - 99 mg/dL Final   Glucose reference range applies only to samples taken after fasting for at least 8 hours.   Glucose-Capillary 01/29/2022 160 (H)  70 - 99 mg/dL Final   Glucose reference range applies only to samples taken after fasting for at least 8 hours.   Glucose-Capillary 01/29/2022 209 (H)  70 - 99 mg/dL Final   Glucose reference range applies only to samples taken after fasting for at least 8 hours.   Glucose-Capillary 01/29/2022 155 (H)  70 - 99 mg/dL Final   Glucose reference range applies only to samples taken after fasting for at least 8 hours.   Glucose-Capillary 01/30/2022 157 (H)  70 - 99 mg/dL Final   Glucose reference range applies only to samples taken after fasting for at least 8 hours.   Glucose-Capillary 01/30/2022 168 (H)  70 - 99 mg/dL Final   Glucose reference range applies only to  samples taken after fasting for at least 8 hours.   Glucose-Capillary 01/30/2022 217 (H)  70 - 99 mg/dL Final   Glucose reference range applies only to samples taken after fasting for at least 8 hours.   Glucose-Capillary 01/30/2022 246 (H)  70 - 99 mg/dL Final   Glucose reference range applies only to samples taken after fasting for at least 8 hours.   Glucose-Capillary 01/30/2022 287 (H)  70 - 99 mg/dL Final   Glucose reference range applies only to samples taken after fasting for at least 8 hours.   Glucose-Capillary 01/31/2022 138 (H)  70 - 99 mg/dL Final   Glucose reference range applies only to samples taken after fasting for at least 8 hours.   Glucose-Capillary 01/31/2022 193 (H)  70 - 99 mg/dL Final   Glucose reference range applies only to samples taken after fasting for at least 8 hours.   Glucose-Capillary 01/31/2022 274 (H)  70 - 99 mg/dL Final   Glucose reference range applies only to samples taken after fasting for at least 8 hours.   Glucose-Capillary 01/31/2022 320 (H)  70 - 99 mg/dL Final   Glucose reference range applies only to samples taken after fasting for at least 8 hours.   Glucose-Capillary 01/31/2022 160 (H)  70 - 99 mg/dL Final   Glucose reference range applies only to samples taken after fasting for at least 8 hours.   Glucose-Capillary 02/01/2022 184 (H)  70 - 99 mg/dL Final   Glucose reference range applies only to samples taken after fasting for at least 8 hours.   Glucose-Capillary 02/01/2022 138 (H)  70 - 99 mg/dL Final   Glucose reference range applies only to samples taken after fasting for at least 8 hours.   Glucose-Capillary 02/01/2022 171 (H)  70 - 99 mg/dL Final   Glucose reference range applies only to samples taken after fasting for at least 8 hours.   Glucose-Capillary 02/01/2022 124 (H)  70 - 99 mg/dL Final   Glucose reference range applies only to samples taken after fasting for at least 8 hours.   Glucose-Capillary 02/02/2022 112 (H)  70 - 99  mg/dL Final   Glucose reference range applies only to samples taken after fasting for at least 8 hours.   Glucose-Capillary 02/02/2022 125 (H)  70 - 99 mg/dL Final   Glucose reference range applies only to  samples taken after fasting for at least 8 hours.   Glucose-Capillary 02/02/2022 274 (H)  70 - 99 mg/dL Final   Glucose reference range applies only to samples taken after fasting for at least 8 hours.   Glucose-Capillary 02/02/2022 239 (H)  70 - 99 mg/dL Final   Glucose reference range applies only to samples taken after fasting for at least 8 hours.   Glucose-Capillary 02/02/2022 190 (H)  70 - 99 mg/dL Final   Glucose reference range applies only to samples taken after fasting for at least 8 hours.   Glucose-Capillary 02/02/2022 183 (H)  70 - 99 mg/dL Final   Glucose reference range applies only to samples taken after fasting for at least 8 hours.   Glucose-Capillary 02/03/2022 113 (H)  70 - 99 mg/dL Final   Glucose reference range applies only to samples taken after fasting for at least 8 hours.   Glucose-Capillary 02/03/2022 158 (H)  70 - 99 mg/dL Final   Glucose reference range applies only to samples taken after fasting for at least 8 hours.   Glucose-Capillary 02/03/2022 100 (H)  70 - 99 mg/dL Final   Glucose reference range applies only to samples taken after fasting for at least 8 hours.   Glucose-Capillary 02/03/2022 162 (H)  70 - 99 mg/dL Final   Glucose reference range applies only to samples taken after fasting for at least 8 hours.   Glucose-Capillary 02/03/2022 114 (H)  70 - 99 mg/dL Final   Glucose reference range applies only to samples taken after fasting for at least 8 hours.   Glucose-Capillary 02/04/2022 138 (H)  70 - 99 mg/dL Final   Glucose reference range applies only to samples taken after fasting for at least 8 hours.   Glucose-Capillary 02/04/2022 157 (H)  70 - 99 mg/dL Final   Glucose reference range applies only to samples taken after fasting for at least 8  hours.   Glucose-Capillary 02/04/2022 208 (H)  70 - 99 mg/dL Final   Glucose reference range applies only to samples taken after fasting for at least 8 hours.   Glucose-Capillary 02/04/2022 212 (H)  70 - 99 mg/dL Final   Glucose reference range applies only to samples taken after fasting for at least 8 hours.   Glucose-Capillary 02/05/2022 184 (H)  70 - 99 mg/dL Final   Glucose reference range applies only to samples taken after fasting for at least 8 hours.   Glucose-Capillary 02/05/2022 192 (H)  70 - 99 mg/dL Final   Glucose reference range applies only to samples taken after fasting for at least 8 hours.   Glucose-Capillary 02/05/2022 128 (H)  70 - 99 mg/dL Final   Glucose reference range applies only to samples taken after fasting for at least 8 hours.   Glucose-Capillary 02/05/2022 165 (H)  70 - 99 mg/dL Final   Glucose reference range applies only to samples taken after fasting for at least 8 hours.   Glucose-Capillary 02/06/2022 111 (H)  70 - 99 mg/dL Final   Glucose reference range applies only to samples taken after fasting for at least 8 hours.   Glucose-Capillary 02/05/2022 262 (H)  70 - 99 mg/dL Final   Glucose reference range applies only to samples taken after fasting for at least 8 hours.  Admission on 01/20/2022, Discharged on 01/21/2022  Component Date Value Ref Range Status   SARS Coronavirus 2 by RT PCR 01/20/2022 NEGATIVE  NEGATIVE Final   Comment: (NOTE) SARS-CoV-2 target nucleic acids are NOT DETECTED.  The SARS-CoV-2 RNA is generally detectable in upper respiratory specimens during the acute phase of infection. The lowest concentration of SARS-CoV-2 viral copies this assay can detect is 138 copies/mL. A negative result does not preclude SARS-Cov-2 infection and should not be used as the sole basis for treatment or other patient management decisions. A negative result may occur with  improper specimen collection/handling, submission of specimen other than  nasopharyngeal swab, presence of viral mutation(s) within the areas targeted by this assay, and inadequate number of viral copies(<138 copies/mL). A negative result must be combined with clinical observations, patient history, and epidemiological information. The expected result is Negative.  Fact Sheet for Patients:  EntrepreneurPulse.com.au  Fact Sheet for Healthcare Providers:  IncredibleEmployment.be  This test is no                          t yet approved or cleared by the [place] FDA and  has been authorized for detection and/or diagnosis of SARS-CoV-2 by FDA under an Emergency Use Authorization (EUA). This EUA will remain  in effect (meaning this test can be used) for the duration of the COVID-19 declaration under Section 564(b)(1) of the Act, 21 U.S.C.section 360bbb-3(b)(1), unless the authorization is terminated  or revoked sooner.       Influenza A by PCR 01/20/2022 NEGATIVE  NEGATIVE Final   Influenza B by PCR 01/20/2022 NEGATIVE  NEGATIVE Final   Comment: (NOTE) The Xpert Xpress SARS-CoV-2/FLU/RSV plus assay is intended as an aid in the diagnosis of influenza from Nasopharyngeal swab specimens and should not be used as a sole basis for treatment. Nasal washings and aspirates are unacceptable for Xpert Xpress SARS-CoV-2/FLU/RSV testing.  Fact Sheet for Patients: EntrepreneurPulse.com.au  Fact Sheet for Healthcare Providers: IncredibleEmployment.be  This test is not yet approved or cleared by the [place] FDA and has been authorized for detection and/or diagnosis of SARS-CoV-2 by FDA under an Emergency Use Authorization (EUA). This EUA will remain in effect (meaning this test can be used) for the duration of the COVID-19 declaration under Section 564(b)(1) of the Act, 21 U.S.C. section 360bbb-3(b)(1), unless the authorization is terminated or revoked.  Performed at [facility], [place] [address], [place], [place] [address]    Sodium 01/20/2022 136  135 - 145 mmol/L Final   Potassium 01/20/2022 4.0  3.5 - 5.1 mmol/L Final   Chloride 01/20/2022 103  98 - 111 mmol/L Final   CO2 01/20/2022 24  22 - 32 mmol/L Final   Glucose, Bld 01/20/2022 360 (H)  70 - 99 mg/dL Final   Glucose reference range applies only to samples taken after fasting for at least 8 hours.   BUN 01/20/2022 12  8 - 23 mg/dL Final   Creatinine, Ser 01/20/2022 1.01  0.61 - 1.24 mg/dL Final   Calcium 01/20/2022 9.4  8.9 - 10.3 mg/dL Final   Total Protein 01/20/2022 7.3  6.5 - 8.1 g/dL Final   Albumin 01/20/2022 3.4 (L)  3.5 - 5.0 g/dL Final   AST 01/20/2022 14 (L)  15 - 41 U/L Final   ALT 01/20/2022 10  0 - 44 U/L Final   Alkaline Phosphatase 01/20/2022 101  38 - 126 U/L Final   Total Bilirubin 01/20/2022 0.3  0.3 - 1.2 mg/dL Final   GFR, Estimated 01/20/2022 >60  >60 mL/min Final   Comment: (NOTE) Calculated using the CKD-EPI Creatinine Equation (2021)    Anion gap 01/20/2022 9  5 -  15 Final   Performed at [facility], [place] [address], [place], [place] [address]   Alcohol, Ethyl (B) 01/20/2022 <10  <10 mg/dL Final   Comment: (NOTE) Lowest detectable limit for serum alcohol is 10 mg/dL.  For medical purposes only. Performed at [facility], [place] [address] [place], [place] [address]    Opiates 01/20/2022 NONE DETECTED  NONE DETECTED Final   Cocaine 01/20/2022 POSITIVE (A)  NONE DETECTED Final   Benzodiazepines 01/20/2022 NONE DETECTED  NONE DETECTED Final   Amphetamines 01/20/2022 NONE DETECTED  NONE DETECTED Final   Tetrahydrocannabinol 01/20/2022 NONE DETECTED  NONE DETECTED Final   Barbiturates 01/20/2022 NONE DETECTED  NONE DETECTED Final   Comment: (NOTE) DRUG SCREEN FOR MEDICAL PURPOSES ONLY.  IF CONFIRMATION IS NEEDED FOR ANY PURPOSE, NOTIFY LAB WITHIN 5 DAYS.  LOWEST DETECTABLE LIMITS FOR URINE DRUG SCREEN Drug Class                     Cutoff  (ng/mL) Amphetamine and metabolites    1000 Barbiturate and metabolites    200 Benzodiazepine                 200 Opiates and metabolites        300 Cocaine and metabolites        300 THC                            50 Performed at [facility], [place] [address] [place], [place] [address]    WBC 01/20/2022 5.2  4.0 - 10.5 K/uL Final   RBC 01/20/2022 4.73  4.22 - 5.81 MIL/uL Final   Hemoglobin 01/20/2022 14.3  13.0 - 17.0 g/dL Final   HCT 01/20/2022 43.1  39.0 - 52.0 % Final   MCV 01/20/2022 91.1  80.0 - 100.0 fL Final   MCH 01/20/2022 30.2  26.0 - 34.0 pg Final   MCHC 01/20/2022 33.2  30.0 - 36.0 g/dL Final   RDW 01/20/2022 14.1  11.5 - 15.5 % Final   Platelets 01/20/2022 444 (H)  150 - 400 K/uL Final   nRBC 01/20/2022 0.0  0.0 - 0.2 % Final   Neutrophils Relative % 01/20/2022 61  % Final   Neutro Abs 01/20/2022 3.2  1.7 - 7.7 K/uL Final   Lymphocytes Relative 01/20/2022 29  % Final   Lymphs Abs 01/20/2022 1.5  0.7 - 4.0 K/uL Final   Monocytes Relative 01/20/2022 7  % Final   Monocytes Absolute 01/20/2022 0.4  0.1 - 1.0 K/uL Final   Eosinophils Relative 01/20/2022 2  % Final   Eosinophils Absolute 01/20/2022 0.1  0.0 - 0.5 K/uL Final   Basophils Relative 01/20/2022 1  % Final   Basophils Absolute 01/20/2022 0.0  0.0 - 0.1 K/uL Final   Immature Granulocytes 01/20/2022 0  % Final   Abs Immature Granulocytes 01/20/2022 0.01  0.00 - 0.07 K/uL Final   Performed at [facility], [place] [address], [place], [place] [address]   Salicylate Lvl 38/25/0539 <7.0 (L)  7.0 - 30.0 mg/dL Final   Performed at [place] [address] [place], [place] [address]   Acetaminophen (Tylenol), Serum 01/20/2022 <10 (L)  10 - 30 ug/mL Final   Comment: (NOTE) Therapeutic concentrations vary significantly. A range of 10-30 ug/mL  may be an effective concentration for many patients. However, some  are best treated at concentrations outside  of this range. Acetaminophen concentrations >150  ug/mL at 4 hours after ingestion  and >50 ug/mL at 12 hours after ingestion are often associated with  toxic reactions.  Performed at [facility], [place] [address], [place], [place] [address]    Glucose-Capillary 01/20/2022 344 (H)  70 - 99 mg/dL Final   Glucose reference range applies only to samples taken after fasting for at least 8 hours.   Comment 1 01/20/2022 Notify RN   Final   Comment 2 01/20/2022 Document in Chart   Final   Glucose-Capillary 01/20/2022 479 (H)  70 - 99 mg/dL Final   Glucose reference range applies only to samples taken after fasting for at least 8 hours.   Glucose-Capillary 01/21/2022 243 (H)  70 - 99 mg/dL Final   Glucose reference range applies only to samples taken after fasting for at least 8 hours.   Glucose-Capillary 01/21/2022 224 (H)  70 - 99 mg/dL Final   Glucose reference range applies only to samples taken after fasting for at least 8 hours.   Glucose-Capillary 01/21/2022 282 (H)  70 - 99 mg/dL Final   Glucose reference range applies only to samples taken after fasting for at least 8 hours.  Admission on 01/18/2022, Discharged on 01/19/2022  Component Date Value Ref Range Status   WBC 01/18/2022 5.3  4.0 - 10.5 K/uL Final   RBC 01/18/2022 4.47  4.22 - 5.81 MIL/uL Final   Hemoglobin 01/18/2022 13.7  13.0 - 17.0 g/dL Final   HCT 01/18/2022 41.0  39.0 - 52.0 % Final   MCV 01/18/2022 91.7  80.0 - 100.0 fL Final   MCH 01/18/2022 30.6  26.0 - 34.0 pg Final   MCHC 01/18/2022 33.4  30.0 - 36.0 g/dL Final   RDW 01/18/2022 14.2  11.5 - 15.5 % Final   Platelets 01/18/2022 445 (H)  150 - 400 K/uL Final   nRBC 01/18/2022 0.0  0.0 - 0.2 % Final   Performed at [facility], [place] [address] [place], [place] [address]   Sodium 01/18/2022 136  135 - 145 mmol/L Final   Potassium 01/18/2022 4.3  3.5 - 5.1 mmol/L Final   Chloride 01/18/2022 100  98 - 111 mmol/L Final   CO2 01/18/2022 22  22 - 32 mmol/L Final   Glucose, Bld 01/18/2022 499 (H)  70 - 99  mg/dL Final   Glucose reference range applies only to samples taken after fasting for at least 8 hours.   BUN 01/18/2022 9  8 - 23 mg/dL Final   Creatinine, Ser 01/18/2022 1.23  0.61 - 1.24 mg/dL Final   Calcium 01/18/2022 9.1  8.9 - 10.3 mg/dL Final   Total Protein 01/18/2022 6.6  6.5 - 8.1 g/dL Final   Albumin 01/18/2022 3.2 (L)  3.5 - 5.0 g/dL Final   AST 01/18/2022 18  15 - 41 U/L Final   ALT 01/18/2022 9  0 - 44 U/L Final   Alkaline Phosphatase 01/18/2022 95  38 - 126 U/L Final   Total Bilirubin 01/18/2022 0.3  0.3 - 1.2 mg/dL Final   GFR, Estimated 01/18/2022 >60  >60 mL/min Final   Comment: (NOTE) Calculated using the CKD-EPI Creatinine Equation (2021)    Anion gap 01/18/2022 14  5 - 15 Final   Performed at [facility] [address], [place], [place] [address]   Color, Urine 01/18/2022 YELLOW  YELLOW Final   APPearance 01/18/2022 CLEAR  CLEAR Final   Specific Gravity, Urine 01/18/2022  1.025  1.005 - 1.030 Final   pH 01/18/2022 6.0  5.0 - 8.0 Final   Glucose, UA 01/18/2022 >=500 (A)  NEGATIVE mg/dL Final   Hgb urine dipstick 01/18/2022 NEGATIVE  NEGATIVE Final   Bilirubin Urine 01/18/2022 NEGATIVE  NEGATIVE Final   Ketones, ur 01/18/2022 NEGATIVE  NEGATIVE mg/dL Final   Protein, ur 01/18/2022 NEGATIVE  NEGATIVE mg/dL Final   Nitrite 01/18/2022 NEGATIVE  NEGATIVE Final   Leukocytes,Ua 01/18/2022 SMALL (A)  NEGATIVE Final   RBC / HPF 01/18/2022 0-5  0 - 5 RBC/hpf Final   WBC, UA 01/18/2022 21-50  0 - 5 WBC/hpf Final   Bacteria, UA 01/18/2022 RARE (A)  NONE SEEN Final   Squamous Epithelial / LPF 01/18/2022 0-5  0 - 5 Final   Budding Yeast 01/18/2022 PRESENT   Final   Performed at [facility], [place] [address], [place], [place] [address]   Opiates 01/18/2022 NONE DETECTED  NONE DETECTED Final   Cocaine 01/18/2022 POSITIVE (A)  NONE DETECTED Final   Benzodiazepines 01/18/2022 NONE DETECTED  NONE DETECTED Final   Amphetamines 01/18/2022 NONE DETECTED  NONE DETECTED  Final   Tetrahydrocannabinol 01/18/2022 NONE DETECTED  NONE DETECTED Final   Barbiturates 01/18/2022 NONE DETECTED  NONE DETECTED Final   Comment: (NOTE) DRUG SCREEN FOR MEDICAL PURPOSES ONLY.  IF CONFIRMATION IS NEEDED FOR ANY PURPOSE, NOTIFY LAB WITHIN 5 DAYS.  LOWEST DETECTABLE LIMITS FOR URINE DRUG SCREEN Drug Class                     Cutoff (ng/mL) Amphetamine and metabolites    1000 Barbiturate and metabolites    200 Benzodiazepine                 200 Opiates and metabolites        300 Cocaine and metabolites        300 THC                            50 Performed at [facility], [place] [address] [place], [place] [address]    Glucose-Capillary 01/18/2022 483 (H)  70 - 99 mg/dL Final   Glucose reference range applies only to samples taken after fasting for at least 8 hours.   Glucose-Capillary 01/19/2022 586 (HH)  70 - 99 mg/dL Final   Glucose reference range applies only to samples taken after fasting for at least 8 hours.   Glucose-Capillary 01/19/2022 456 (H)  70 - 99 mg/dL Final   Glucose reference range applies only to samples taken after fasting for at least 8 hours.   Glucose-Capillary 01/19/2022 515 (HH)  70 - 99 mg/dL Final   Glucose reference range applies only to samples taken after fasting for at least 8 hours.   Comment 1 01/19/2022 Notify RN   Final   Glucose-Capillary 01/19/2022 164 (H)  70 - 99 mg/dL Final   Glucose reference range applies only to samples taken after fasting for at least 8 hours.  Admission on 01/07/2022, Discharged on 01/07/2022  Component Date Value Ref Range Status   Glucose-Capillary 01/07/2022 267 (H)  70 - 99 mg/dL Final   Glucose reference range applies only to samples taken after fasting for at least 8 hours.  Admission on 01/02/2022, Discharged on 01/02/2022  Component Date Value Ref Range Status   WBC 01/02/2022 7.0  4.0 - 10.5 K/uL Final   RBC 01/02/2022 4.54  4.22 -  5.81 MIL/uL Final   Hemoglobin 01/02/2022 13.9  13.0 -  17.0 g/dL Final   HCT 01/02/2022 41.4  39.0 - 52.0 % Final   MCV 01/02/2022 91.2  80.0 - 100.0 fL Final   MCH 01/02/2022 30.6  26.0 - 34.0 pg Final   MCHC 01/02/2022 33.6  30.0 - 36.0 g/dL Final   RDW 01/02/2022 14.1  11.5 - 15.5 % Final   Platelets 01/02/2022 369  150 - 400 K/uL Final   nRBC 01/02/2022 0.0  0.0 - 0.2 % Final   Neutrophils Relative % 01/02/2022 59  % Final   Neutro Abs 01/02/2022 4.1  1.7 - 7.7 K/uL Final   Lymphocytes Relative 01/02/2022 28  % Final   Lymphs Abs 01/02/2022 1.9  0.7 - 4.0 K/uL Final   Monocytes Relative 01/02/2022 8  % Final   Monocytes Absolute 01/02/2022 0.5  0.1 - 1.0 K/uL Final   Eosinophils Relative 01/02/2022 4  % Final   Eosinophils Absolute 01/02/2022 0.3  0.0 - 0.5 K/uL Final   Basophils Relative 01/02/2022 1  % Final   Basophils Absolute 01/02/2022 0.1  0.0 - 0.1 K/uL Final   Immature Granulocytes 01/02/2022 0  % Final   Abs Immature Granulocytes 01/02/2022 0.02  0.00 - 0.07 K/uL Final   Performed at [facility], [place] [address] [place], [place] [address]   Sodium 01/02/2022 138  135 - 145 mmol/L Final   Potassium 01/02/2022 4.1  3.5 - 5.1 mmol/L Final   Chloride 01/02/2022 104  98 - 111 mmol/L Final   CO2 01/02/2022 25  22 - 32 mmol/L Final   Glucose, Bld 01/02/2022 216 (H)  70 - 99 mg/dL Final   Glucose reference range applies only to samples taken after fasting for at least 8 hours.   BUN 01/02/2022 11  8 - 23 mg/dL Final   Creatinine, Ser 01/02/2022 0.98  0.61 - 1.24 mg/dL Final   Calcium 01/02/2022 9.5  8.9 - 10.3 mg/dL Final   Total Protein 01/02/2022 7.4  6.5 - 8.1 g/dL Final   Albumin 01/02/2022 3.4 (L)  3.5 - 5.0 g/dL Final   AST 01/02/2022 12 (L)  15 - 41 U/L Final   ALT 01/02/2022 11  0 - 44 U/L Final   Alkaline Phosphatase 01/02/2022 90  38 - 126 U/L Final   Total Bilirubin 01/02/2022 0.9  0.3 - 1.2 mg/dL Final   GFR, Estimated 01/02/2022 >60  >60 mL/min Final   Comment: (NOTE) Calculated using the CKD-EPI Creatinine  Equation (2021)    Anion gap 01/02/2022 9  5 - 15 Final   Performed at [facility] [address] [place], [place] [address]  [facility] on 11/01/2021  Component Date Value Ref Range Status   POC Glucose 11/27/2021 301 (A)  70 - 99 mg/dl Final   1 hr after lunch, had not taken insulin  Admission on 10/13/2021, Discharged on 10/15/2021  Component Date Value Ref Range Status   Glucose-Capillary 10/13/2021 388 (H)  70 - 99 mg/dL Final   Glucose reference range applies only to samples taken after fasting for at least 8 hours.   SARS Coronavirus 2 by RT PCR 10/13/2021 NEGATIVE  NEGATIVE Final   Comment: (NOTE) SARS-CoV-2 target nucleic acids are NOT DETECTED.  The SARS-CoV-2 RNA is generally detectable in upper respiratory specimens during the acute phase of infection. The lowest concentration of SARS-CoV-2 viral copies this assay can detect is 138 copies/mL. A negative result  does not preclude SARS-Cov-2 infection and should not be used as the sole basis for treatment or other patient management decisions. A negative result may occur with  improper specimen collection/handling, submission of specimen other than nasopharyngeal swab, presence of viral mutation(s) within the areas targeted by this assay, and inadequate number of viral copies(<138 copies/mL). A negative result must be combined with clinical observations, patient history, and epidemiological information. The expected result is Negative.  Fact Sheet for Patients:  EntrepreneurPulse.com.au  Fact Sheet for Healthcare Providers:  IncredibleEmployment.be  This test is no                          t yet approved or cleared by the [place] FDA and  has been authorized for detection and/or diagnosis of SARS-CoV-2 by FDA under an Emergency Use Authorization (EUA). This EUA will remain  in effect (meaning this test can be used) for the duration of the COVID-19  declaration under Section 564(b)(1) of the Act, 21 U.S.C.section 360bbb-3(b)(1), unless the authorization is terminated  or revoked sooner.       Influenza A by PCR 10/13/2021 NEGATIVE  NEGATIVE Final   Influenza B by PCR 10/13/2021 NEGATIVE  NEGATIVE Final   Comment: (NOTE) The Xpert Xpress SARS-CoV-2/FLU/RSV plus assay is intended as an aid in the diagnosis of influenza from Nasopharyngeal swab specimens and should not be used as a sole basis for treatment. Nasal washings and aspirates are unacceptable for Xpert Xpress SARS-CoV-2/FLU/RSV testing.  Fact Sheet for Patients: EntrepreneurPulse.com.au  Fact Sheet for Healthcare Providers: IncredibleEmployment.be  This test is not yet approved or cleared by the [place] FDA and has been authorized for detection and/or diagnosis of SARS-CoV-2 by FDA under an Emergency Use Authorization (EUA). This EUA will remain in effect (meaning this test can be used) for the duration of the COVID-19 declaration under Section 564(b)(1) of the Act, 21 U.S.C. section 360bbb-3(b)(1), unless the authorization is terminated or revoked.  Performed at [facility], [place] [address] [place], [place] [address]    WBC 10/13/2021 9.5  4.0 - 10.5 K/uL Final   RBC 10/13/2021 4.23  4.22 - 5.81 MIL/uL Final   Hemoglobin 10/13/2021 13.0  13.0 - 17.0 g/dL Final   HCT 10/13/2021 40.4  39.0 - 52.0 % Final   MCV 10/13/2021 95.5  80.0 - 100.0 fL Final   MCH 10/13/2021 30.7  26.0 - 34.0 pg Final   MCHC 10/13/2021 32.2  30.0 - 36.0 g/dL Final   RDW 10/13/2021 13.4  11.5 - 15.5 % Final   Platelets 10/13/2021 551 (H)  150 - 400 K/uL Final   nRBC 10/13/2021 0.0  0.0 - 0.2 % Final   Neutrophils Relative % 10/13/2021 68  % Final   Neutro Abs 10/13/2021 6.5  1.7 - 7.7 K/uL Final   Lymphocytes Relative 10/13/2021 18  % Final   Lymphs Abs 10/13/2021 1.7  0.7 - 4.0 K/uL Final   Monocytes Relative 10/13/2021 9  % Final    Monocytes Absolute 10/13/2021 0.8  0.1 - 1.0 K/uL Final   Eosinophils Relative 10/13/2021 3  % Final   Eosinophils Absolute 10/13/2021 0.3  0.0 - 0.5 K/uL Final   Basophils Relative 10/13/2021 1  % Final   Basophils Absolute 10/13/2021 0.1  0.0 - 0.1 K/uL Final   Immature Granulocytes 10/13/2021 1  % Final   Abs Immature Granulocytes 10/13/2021 0.11 (H)  0.00 - 0.07 K/uL Final   Performed  at [facility], [place] [address], [place], [place] [address]   Sodium 10/13/2021 136  135 - 145 mmol/L Final   Potassium 10/13/2021 4.3  3.5 - 5.1 mmol/L Final   Chloride 10/13/2021 104  98 - 111 mmol/L Final   CO2 10/13/2021 24  22 - 32 mmol/L Final   Glucose, Bld 10/13/2021 296 (H)  70 - 99 mg/dL Final   Glucose reference range applies only to samples taken after fasting for at least 8 hours.   BUN 10/13/2021 14  8 - 23 mg/dL Final   Creatinine, Ser 10/13/2021 1.15  0.61 - 1.24 mg/dL Final   Calcium 10/13/2021 9.0  8.9 - 10.3 mg/dL Final   Total Protein 10/13/2021 7.5  6.5 - 8.1 g/dL Final   Albumin 10/13/2021 3.0 (L)  3.5 - 5.0 g/dL Final   AST 10/13/2021 13 (L)  15 - 41 U/L Final   ALT 10/13/2021 10  0 - 44 U/L Final   Alkaline Phosphatase 10/13/2021 84  38 - 126 U/L Final   Total Bilirubin 10/13/2021 0.5  0.3 - 1.2 mg/dL Final   GFR, Estimated 10/13/2021 >60  >60 mL/min Final   Comment: (NOTE) Calculated using the CKD-EPI Creatinine Equation (2021)    Anion gap 10/13/2021 8  5 - 15 Final   Performed at [facility], [place] [address] [place], [place] [address]   Color, Urine 10/13/2021 YELLOW  YELLOW Final   APPearance 10/13/2021 CLOUDY (A)  CLEAR Final   Specific Gravity, Urine 10/13/2021 1.022  1.005 - 1.030 Final   pH 10/13/2021 5.0  5.0 - 8.0 Final   Glucose, UA 10/13/2021 >=500 (A)  NEGATIVE mg/dL Final   Hgb urine dipstick 10/13/2021 LARGE (A)  NEGATIVE Final   Bilirubin Urine 10/13/2021 NEGATIVE  NEGATIVE Final   Ketones, ur 10/13/2021 NEGATIVE  NEGATIVE mg/dL Final    Protein, ur 10/13/2021 30 (A)  NEGATIVE mg/dL Final   Nitrite 10/13/2021 NEGATIVE  NEGATIVE Final   Leukocytes,Ua 10/13/2021 LARGE (A)  NEGATIVE Final   RBC / HPF 10/13/2021 >50 (H)  0 - 5 RBC/hpf Final   WBC, UA 10/13/2021 >50 (H)  0 - 5 WBC/hpf Final   Bacteria, UA 10/13/2021 RARE (A)  NONE SEEN Final   Squamous Epithelial / LPF 10/13/2021 0-5  0 - 5 Final   WBC Clumps 10/13/2021 PRESENT   Final   Mucus 10/13/2021 PRESENT   Final   Budding Yeast 10/13/2021 PRESENT   Final   Non Squamous Epithelial 10/13/2021 0-5 (A)  NONE SEEN Final   Performed at [facility], [place] [address] [place], [place] [address]   pH, Ven 10/13/2021 7.457 (H)  7.25 - 7.43 Final   pCO2, Ven 10/13/2021 38.1 (L)  44 - 60 mmHg Final   pO2, Ven 10/13/2021 174 (H)  32 - 45 mmHg Final   Bicarbonate 10/13/2021 26.9  20.0 - 28.0 mmol/L Final   TCO2 10/13/2021 28  22 - 32 mmol/L Final   O2 Saturation 10/13/2021 100  % Final   Acid-Base Excess 10/13/2021 3.0 (H)  0.0 - 2.0 mmol/L Final   Sodium 10/13/2021 137  135 - 145 mmol/L Final   Potassium 10/13/2021 4.3  3.5 - 5.1 mmol/L Final   Calcium, Ion 10/13/2021 1.10 (L)  1.15 - 1.40 mmol/L Final   HCT 10/13/2021 40.0  39.0 - 52.0 % Final   Hemoglobin 10/13/2021 13.6  13.0 - 17.0 g/dL Final   Sample type 10/13/2021 VENOUS  Final   Opiates 10/13/2021 NONE DETECTED  NONE DETECTED Final   Cocaine 10/13/2021 POSITIVE (A)  NONE DETECTED Final   Benzodiazepines 10/13/2021 NONE DETECTED  NONE DETECTED Final   Amphetamines 10/13/2021 NONE DETECTED  NONE DETECTED Final   Tetrahydrocannabinol 10/13/2021 NONE DETECTED  NONE DETECTED Final   Barbiturates 10/13/2021 NONE DETECTED  NONE DETECTED Final   Comment: (NOTE) DRUG SCREEN FOR MEDICAL PURPOSES ONLY.  IF CONFIRMATION IS NEEDED FOR ANY PURPOSE, NOTIFY LAB WITHIN 5 DAYS.  LOWEST DETECTABLE LIMITS FOR URINE DRUG SCREEN Drug Class                     Cutoff (ng/mL) Amphetamine and metabolites    1000 Barbiturate and  metabolites    200 Benzodiazepine                 161 Tricyclics and metabolites     300 Opiates and metabolites        300 Cocaine and metabolites        300 THC                            50 Performed at [facility], [place] [address] [place], [place] [address]    Adenovirus 10/13/2021 NOT DETECTED  NOT DETECTED Final   Coronavirus 229E 10/13/2021 NOT DETECTED  NOT DETECTED Final   Comment: (NOTE) The Coronavirus on the Respiratory Panel, DOES NOT test for the novel  Coronavirus (2019 nCoV)    Coronavirus HKU1 10/13/2021 NOT DETECTED  NOT DETECTED Final   Coronavirus NL63 10/13/2021 NOT DETECTED  NOT DETECTED Final   Coronavirus OC43 10/13/2021 NOT DETECTED  NOT DETECTED Final   Metapneumovirus 10/13/2021 NOT DETECTED  NOT DETECTED Final   Rhinovirus / Enterovirus 10/13/2021 NOT DETECTED  NOT DETECTED Final   Influenza A 10/13/2021 NOT DETECTED  NOT DETECTED Final   Influenza B 10/13/2021 NOT DETECTED  NOT DETECTED Final   Parainfluenza Virus 1 10/13/2021 NOT DETECTED  NOT DETECTED Final   Parainfluenza Virus 2 10/13/2021 NOT DETECTED  NOT DETECTED Final   Parainfluenza Virus 3 10/13/2021 NOT DETECTED  NOT DETECTED Final   Parainfluenza Virus 4 10/13/2021 NOT DETECTED  NOT DETECTED Final   Respiratory Syncytial Virus 10/13/2021 NOT DETECTED  NOT DETECTED Final   Bordetella pertussis 10/13/2021 NOT DETECTED  NOT DETECTED Final   Bordetella Parapertussis 10/13/2021 NOT DETECTED  NOT DETECTED Final   Chlamydophila pneumoniae 10/13/2021 NOT DETECTED  NOT DETECTED Final   Mycoplasma pneumoniae 10/13/2021 NOT DETECTED  NOT DETECTED Final   Performed at [facility], [place] [address], [place], [place] [address]   Procalcitonin 10/13/2021 <0.10  ng/mL Final   Comment:        Interpretation: PCT (Procalcitonin) <= 0.5 ng/mL: Systemic infection (sepsis) is not likely. Local bacterial infection is possible. (NOTE)       Sepsis PCT Algorithm           Lower Respiratory Tract                                       Infection PCT Algorithm    ----------------------------     ----------------------------         PCT < 0.25 ng/mL                PCT < 0.10 ng/mL  Strongly encourage             Strongly discourage   discontinuation of antibiotics    initiation of antibiotics    ----------------------------     -----------------------------       PCT 0.25 - 0.50 ng/mL            PCT 0.10 - 0.25 ng/mL               OR       >80% decrease in PCT            Discourage initiation of                                            antibiotics      Encourage discontinuation           of antibiotics    ----------------------------     -----------------------------         PCT >= 0.50 ng/mL              PCT 0.26 - 0.50 ng/mL               AND                                 <80% decrease in PCT             Encourage initiation of                                             antibiotics       Encourage continuation           of antibiotics    ----------------------------     -----------------------------        PCT >= 0.50 ng/mL                  PCT > 0.50 ng/mL               AND         increase in PCT                  Strongly encourage                                      initiation of antibiotics    Strongly encourage escalation           of antibiotics                                     -----------------------------                                           PCT <= 0.25 ng/mL  OR                                        > 80% decrease in PCT                                      Discontinue / Do not initiate                                             antibiotics  Performed at [facility], [place] [address], [place], [place] [address]    B Natriuretic Peptide 10/13/2021 23.6  0.0 - 100.0 pg/mL Final   Performed at [facility] [address], [place], [place] [address]   L. pneumophila Serogp 1 Ur Ag  10/13/2021 Negative  Negative Final   Comment: (NOTE) Presumptive negative for L. pneumophila serogroup 1 antigen in urine, suggesting no recent or current infection. Legionnaires' disease cannot be ruled out since other serogroups and species may also cause disease. Performed At: [facility] [place], [place] [id number] Rush Farmer MD YB:[id number]    Source of Sample 10/13/2021 URINE, RANDOM   Final   Performed at [facility], [place] [address] [place], [place] [address]   Strep Pneumo Urinary Antigen 10/13/2021 NEGATIVE  NEGATIVE Final   Comment:        Infection due to S. pneumoniae cannot be absolutely ruled out since the antigen present may be below the detection limit of the test. Performed at [facility], [address] [place], [place] [address]    Sodium 10/14/2021 138  135 - 145 mmol/L Final   Potassium 10/14/2021 4.3  3.5 - 5.1 mmol/L Final   Chloride 10/14/2021 106  98 - 111 mmol/L Final   CO2 10/14/2021 25  22 - 32 mmol/L Final   Glucose, Bld 10/14/2021 286 (H)  70 - 99 mg/dL Final   Glucose reference range applies only to samples taken after fasting for at least 8 hours.   BUN 10/14/2021 14  8 - 23 mg/dL Final   Creatinine, Ser 10/14/2021 0.98  0.61 - 1.24 mg/dL Final   Calcium 10/14/2021 8.9  8.9 - 10.3 mg/dL Final   GFR, Estimated 10/14/2021 >60  >60 mL/min Final   Comment: (NOTE) Calculated using the CKD-EPI Creatinine Equation (2021)    Anion gap 10/14/2021 7  5 - 15 Final   Performed at [facility], [place] [address] [place], [place] [address]   WBC 10/14/2021 9.6  4.0 - 10.5 K/uL Final   RBC 10/14/2021 4.12 (L)  4.22 - 5.81 MIL/uL Final   Hemoglobin 10/14/2021 12.6 (L)  13.0 - 17.0 g/dL Final   HCT 10/14/2021 38.8 (L)  39.0 - 52.0 % Final   MCV 10/14/2021 94.2  80.0 - 100.0 fL Final   MCH 10/14/2021 30.6  26.0 - 34.0 pg Final   MCHC 10/14/2021 32.5  30.0 - 36.0 g/dL Final   RDW 10/14/2021 13.4  11.5 - 15.5 %  Final   Platelets 10/14/2021 556 (H)  150 - 400 K/uL Final   nRBC 10/14/2021 0.0  0.0 - 0.2 % Final   Performed at [facility], [place] [address], [place], [place] [address]   Glucose-Capillary 10/13/2021 281 (H)  70 - 99 mg/dL Final   Glucose reference range applies only to samples taken after fasting for at least 8 hours.   Glucose-Capillary 10/13/2021 399 (H)  70 - 99 mg/dL Final   Glucose reference range applies only to samples taken after fasting for at least 8 hours.   Glucose-Capillary 10/14/2021 239 (H)  70 - 99 mg/dL Final   Glucose reference range applies only to samples taken after fasting for at least 8 hours.   Glucose-Capillary 10/14/2021 345 (H)  70 - 99 mg/dL Final   Glucose reference range applies only to samples taken after fasting for at least 8 hours.   Glucose-Capillary 10/14/2021 383 (H)  70 - 99 mg/dL Final   Glucose reference range applies only to samples taken after fasting for at least 8 hours.   Glucose-Capillary 10/15/2021 194 (H)  70 - 99 mg/dL Final   Glucose reference range applies only to samples taken after fasting for at least 8 hours.   Glucose-Capillary 10/15/2021 161 (H)  70 - 99 mg/dL Final   Glucose reference range applies only to samples taken after fasting for at least 8 hours.   Glucose-Capillary 10/15/2021 312 (H)  70 - 99 mg/dL Final   Glucose reference range applies only to samples taken after fasting for at least 8 hours.   Glucose-Capillary 10/14/2021 279 (H)  70 - 99 mg/dL Final   Glucose reference range applies only to samples taken after fasting for at least 8 hours.  There may be more visits with results that are not included.    Blood Alcohol level:  Lab Results  Component Value Date   ETH <10 03/02/2022   ETH <10 13/24/4010    Metabolic Disorder Labs: Lab Results  Component Value Date   HGBA1C 10.4 (H) 01/21/2022   MPG 251.78 01/21/2022   MPG >398 02/24/2021   No results found for: "PROLACTIN" Lab Results  Component  Value Date   CHOL 213 (H) 05/23/2021   TRIG 115 05/23/2021   HDL 71 05/23/2021   CHOLHDL 3.2 11/03/2019   VLDL 32 (H) 05/03/2015   LDLCALC 122 (H) 05/23/2021   LDLCALC 99 11/03/2019    Therapeutic Lab Levels: No results found for: "LITHIUM" No results found for: "VALPROATE" No results found for: "CBMZ"  Physical Findings   GAD-7    Flowsheet Row Office Visit from 10/08/2021 in [facility] Office Visit from 09/05/2021 in [facility] Office Visit from 12/26/2020 in [facility] Office Visit from 11/17/2018 in [facility] Office Visit from 06/05/2017 in [facility]  Total GAD-7 Score _0 0 12      PHQ2-9    [facility] ED from 03/02/2022 in [facility] ED from 01/21/2022 in [facility] Office Visit from 10/08/2021 in [facility] Office Visit from 09/05/2021 in [facility] ED from [facility]/02/2021 in [facility]  PHQ-2 Total Score 0 0 _1 PHQ-9 Total Score 0 -- _2 Flowsheet Row ED from 03/02/2022 in [facility] Most recent reading at 03/03/2022  1:19 AM ED from 03/01/2022 in [facility] Most recent reading at 03/01/2022  5:43 PM ED from  03/01/2022 in [facility] Most recent reading at 03/01/2022 12:10 AM  C-SSRS RISK CATEGORY No Risk No Risk No Risk        Musculoskeletal  Strength & Muscle Tone: within normal limits Gait & Station: leaned to the right Patient leans: N/A   Mental Status Exam   Appearance and Grooming: Patient is casually dressed. The patient has no noticeable scent or odor. Motor activity: The patient's movement speed was normal; his gait was leaning towards right. There was no notable abnormal  facial movements and no notable abnormal extremity movements. Behavior: The patient appears in no acute distress, and during the interview, was calm, focused, required minimal redirection, and behaving appropriately to scenario; he was able to follow commands and compliant to requests and made good eye contact. The patient did not appear internally or externally preoccupied. Attitude: Patient's attitude towards the interviewer was cooperative. Speech: The patient's speech was clear, fluent, with good articulation, and with appropriately placed inflections. The volume of his speech was normal and normal in quantity. The rate was normal with a normal rhythm. Responses were normal in latency. There were no abnormal patterns in speech. Mood: "Not letting nothing bother me" Affect: Patient's affect is euthymic with broad range and even fluctuations; his affect is congruent with his stated mood. ------------------------------------------------------------------------------------------------------------------------- Thought Content The patient experiences no hallucinations. The patient describes no delusional thoughts; he denies thought insertion, denies thought withdrawal, denies thought interruption, and denies thought broadcasting. Patient at the time of interview denies active suicidal intent and denies passive suicidal ideation; he denies homicidal intent. Thought Process The patient's thought process is linear and is goal-directed. Insight The patient at the time of interview demonstrates fair insight Judgement The patient over the past 24 hours demonstrates fair judgement  Memory: poor  Executive Functions  Concentration: intact Attention Span: Fair Recall: intact Fund of Knowledge: fair  Alertness/Orientation: alert and answering questions appropriately  Psychomotor Activity  Psychomotor Activity: No data recorded   Assets  Assets: Communication Skills; Desire for  Improvement; Resilience   Sleep  Sleep: Fair   Physical Exam  Constitutional:      Appearance: the patient is not toxic-appearing.  Pulmonary:     Effort: Pulmonary effort is normal.  Neurological:     General: No focal deficit present.     Mental Status: the patient is alert and answering questions appropriately Review of Systems  Respiratory:  Negative for shortness of breath.   Cardiovascular:  Negative for chest pain.  Gastrointestinal:  Negative for abdominal pain, constipation, diarrhea, nausea and vomiting.  Neurological:  Negative for headaches.   Blood pressure (!) 136/105, pulse 89, temperature 98.3 F (36.8 C), temperature source Oral, resp. rate 18, SpO2 99 %. There is no height or weight on file to calculate BMI.  Treatment Plan Summary: Daily contact with patient to assess and evaluate symptoms and progress in treatment and Medication management  David Wyatt is a 61 yr old male who presented to [facility] on 12/22 requesting Detox from Crack Cocaine, he was admitted to [facility] on 12/24.  PPHx is significant for Substance Induced Mood Disorder, Cocaine Use Disorder, tobacco use disorder, and no Psychiatric Hospitalizations or Suicide Attempts.  PMHx Is significant for DM2 + neuropathy, HTN, HLD, and housing instability.   Yousef is tolerating detox well. He is day 7 from last use of cocaine. He denies further withdrawal symptoms. We will not make any other changes to his medications at this time.  We  will continue to monitor, especially his diabetes management. His elevated CBG was in the setting of incorrect reading on glucometer.    Substance Induced Mood Disorder: Na improved from prior, now 142.  -Continue Cymbalta 60 mg daily for mood disorder and neuropathy  DM2-insulin-dependent with neuropathy Urge Incontinence  BPH: Suspect elevated CBGs in the setting of medication noncompliance in the outpatient setting. Kidney function back to baseline (Cr 1.13). CBGs improved  today.  -Continue sensitive SSI -Continue Novolog 3 units BID before lunch and dinner -Continue Semglee 24 Units daily -Continue Glipizide XL 10 mg daily with breakfast -Continue Metformin 1000 mg BID with meals -Continue Gabapentin 600 mg TID for neuropathy -Continue Flomax 0.8 mg daily   GERD: -Continue Pepcid 60m BID   HTN: -Continue Amlodipine 10 mg daily   Hyperlipidemia: -Continue Lipitor 40 mg daily   -Continue PRN's: Tylenol, Maalox, Atarax, Milk of Magnesia, Trazodone    Considerations for follow-up: -Recommend high risk screening every 6-12 months with HIV, RPR, hepatitis panel -PCP for insulin dependent DM2 - uncontrolled -PCP for MOCA and memory   Dispo: discharge tomorrow to shelters. [place] not [place] in [facility]. Social work has called multiple different shelters on [employer]   SRolanda Lundborg MD, PGY-1 03/07/2022 10:22 AM

## 2022-03-07 NOTE — ED Notes (Signed)
Pt is in the bed sleeping. Respirations are even and unlabored. No acute distress noted. Will continue to monitor for safety. 

## 2022-03-07 NOTE — Discharge Planning (Signed)
LCSW followed up with [facility] and spoke with Admissions Representative Abagail Kitchens regarding patient. Abagail Kitchens made aware that multiple attempts were made by patient to complete phone interview on yesterday. Per Abagail Kitchens, he is able to complete first phone screening with patient at this time. LCSW and patient spoke with Representative together to complete first phone screening. Information regarding program was provided. Patient aware that program is 9 months and requires patient to work. Jobs described to patient and patient is agreeable to plan if accepted. Agency is willing to accept insulin injectables and asked for LCSW to send over med list. Information to be sent over to LCSW email to provide patient to read over. Second interview has been scheduled for 9:45am on today. No other needs to report at this time.   LCSW will continue to follow and provide support to patient while on FBC.   Lucius Conn, LCSW Clinical Social Worker [facility] Ph: [phone]

## 2022-03-07 NOTE — ED Notes (Signed)
Patient A&Ox4. Denies intent to harm self/others when asked. Denies A/VH. Patient denies any physical complaints when asked. No acute distress noted. Support and encouragement provided. Routine safety checks conducted according to facility protocol. Encouraged patient to notify staff if thoughts of harm toward self or others arise. Patient verbalize understanding and agreement. Will continue to monitor for safety.    

## 2022-03-08 DIAGNOSIS — F1910 Other psychoactive substance abuse, uncomplicated: Secondary | ICD-10-CM | POA: Diagnosis not present

## 2022-03-08 DIAGNOSIS — F1420 Cocaine dependence, uncomplicated: Secondary | ICD-10-CM | POA: Diagnosis not present

## 2022-03-08 DIAGNOSIS — Z79899 Other long term (current) drug therapy: Secondary | ICD-10-CM | POA: Diagnosis not present

## 2022-03-08 DIAGNOSIS — Z794 Long term (current) use of insulin: Secondary | ICD-10-CM | POA: Diagnosis not present

## 2022-03-08 LAB — GLUCOSE, CAPILLARY: Glucose-Capillary: 213 mg/dL — ABNORMAL HIGH (ref 70–99)

## 2022-03-08 MED ORDER — TAMSULOSIN HCL 0.4 MG PO CAPS: 0.8000 mg | ORAL_CAPSULE | Freq: Every day | ORAL | 0 refills | Status: AC

## 2022-03-08 MED ORDER — TRAZODONE HCL 100 MG PO TABS: 100.0000 mg | ORAL_TABLET | Freq: Every evening | ORAL | 0 refills | Status: DC | PRN

## 2022-03-08 MED ORDER — GABAPENTIN 300 MG PO CAPS: 600.0000 mg | ORAL_CAPSULE | Freq: Three times a day (TID) | ORAL | 0 refills | Status: DC

## 2022-03-08 MED ORDER — ATORVASTATIN CALCIUM 40 MG PO TABS: 40.0000 mg | ORAL_TABLET | Freq: Every day | ORAL | 0 refills | Status: DC

## 2022-03-08 MED ORDER — METFORMIN HCL 1000 MG PO TABS: 1000.0000 mg | ORAL_TABLET | Freq: Two times a day (BID) | ORAL | 0 refills | Status: DC

## 2022-03-08 MED ORDER — AMLODIPINE BESYLATE 10 MG PO TABS: 10.0000 mg | ORAL_TABLET | Freq: Every day | ORAL | 0 refills | Status: DC

## 2022-03-08 MED ORDER — GLIPIZIDE ER 10 MG PO TB24: 10.0000 mg | ORAL_TABLET | Freq: Every day | ORAL | 0 refills | Status: DC

## 2022-03-08 MED ORDER — DULOXETINE HCL 60 MG PO CPEP: 60.0000 mg | ORAL_CAPSULE | Freq: Every day | ORAL | 0 refills | Status: DC

## 2022-03-08 MED ORDER — FAMOTIDINE 10 MG PO TABS: 10.0000 mg | ORAL_TABLET | Freq: Two times a day (BID) | ORAL | 0 refills | Status: DC

## 2022-03-08 NOTE — ED Provider Notes (Signed)
FBC/OBS ASAP Discharge Summary  Date and Time: 03/08/2022 11:28 AM  Name: David Wyatt  MRN:  [id number]   Discharge Diagnoses:  Final diagnoses:  Substance abuse ([place])  Cocaine use disorder [facility])    Subjective: David Wyatt is a 61 yr old male who presented to [facility] on 12/22 requesting Detox from Crack Cocaine, he was admitted to [facility] on 12/24.  PPHx is significant for Substance Induced Mood Disorder, Cocaine Use Disorder, tobacco use disorder, and no Psychiatric Hospitalizations or Suicide Attempts.  PMHx Is significant for DM2 + neuropathy, HTN, HLD, and housing instability.   Stay Summary: The patient was evaluated each day by a clinical provider to ascertain response to treatment. Improvement was noted by the patient's report of decreasing symptoms, improved sleep and appetite, affect, medication tolerance, behavior, and participation in unit programming.  Patient was asked each day to complete a self inventory noting mood, mental status, pain, new symptoms, anxiety and concerns.   Patient responded well to medication and being in a therapeutic and supportive environment. Positive and appropriate behavior was noted and the patient was motivated for recovery. The patient worked closely with the treatment team and case manager to develop a discharge plan with appropriate goals. Coping skills, problem solving as well as relaxation therapies were also part of the unit programming.   By the day of discharge patient was in much improved condition than upon admission.  Symptoms were reported as significantly decreased or resolved completely. The patient denied SI/HI and voiced no AVH. The patient was motivated to continue taking medication with a goal of continued improvement in mental health.    Total Time spent with patient: 20 minutes  Past Psychiatric History: Substance Induced Mood Disorder, Cocaine Use Disorder, tobacco use disorder, and no Psychiatric Hospitalizations or Suicide Attempts.   Past Medical History:  Past Medical History:  Diagnosis Date   Angioedema of lips 07/28/2012   left upper (07/29/2012)   Arthritis    hands and back   Chronic back pain    Cocaine abuse (HCC)    Diabetic neuropathy (HCC)    High cholesterol    Hypertension    Neuropathy    Type II diabetes mellitus ([place])     Past Surgical History:  Procedure Laterality Date   BACK SURGERY     CYSTOSCOPY W/ URETERAL STENT PLACEMENT Right 02/23/2021   Procedure: CYSTOSCOPY WITH RETROGRADE PYELOGRAM/URETERAL STENT PLACEMENT;  Surgeon: Janith Lima, MD;  Location: [facility];  Service: Urology;  Laterality: Right;   HERNIA REPAIR Right 04/01/2012   I & D EXTREMITY Left 12/13/2012   Procedure: IRRIGATION AND DEBRIDEMENT LEFT THUMB;  Surgeon: Tennis Must, MD;  Location: [place];  Service: Orthopedics;  Laterality: Left;   INCISION AND DRAINAGE OF WOUND     boil on back/notes 07/14/2008 (07/29/2012)   INGUINAL HERNIA REPAIR  04/01/2012   Procedure: HERNIA REPAIR INGUINAL ADULT;  Surgeon: Haywood Lasso, MD;  Location: [place];  Service: General;  Laterality: Right;   INGUINAL HERNIA REPAIR Left 09/06/2016   Procedure: OPEN REPAIR LEFT INGUINAL HERNIA;  Surgeon: Greer Pickerel, MD;  Location: [place];  Service: General;  Laterality: Left;   INSERTION OF MESH Left 09/06/2016   Procedure: INSERTION OF MESH;  Surgeon: Greer Pickerel, MD;  Location: [facility] OR;  Service: General;  Laterality: Left;   TONSILLECTOMY     Family History:  Family History  Problem Relation Age of Onset   Diabetes Mother    Kidney disease Mother  Hyperlipidemia Mother    Hyperlipidemia Father    Diabetes Father    Family Psychiatric History: None Reported  Social History:  Social History   Substance and Sexual Activity  Alcohol Use No   Alcohol/week: 0.0 standard drinks of alcohol     Social History   Substance and Sexual Activity  Drug Use Not Currently   Types: "Crack" cocaine    Social History   Socioeconomic History    Marital status: Divorced    Spouse name: Not on file   Number of children: 3   Years of education: 12   Highest education level: Not on file  Occupational History    Comment: disabled  Tobacco Use   Smoking status: Every Day    Packs/day: 0.25    Years: 30.00    Total pack years: 7.50    Types: Cigarettes   Smokeless tobacco: Former    Quit date: 08/15/2015   Tobacco comments:    04/29/16 2  cigs daily, 10/27/17 sometimes < .25 PPD  Vaping Use   Vaping Use: Never used  Substance and Sexual Activity   Alcohol use: No    Alcohol/week: 0.0 standard drinks of alcohol   Drug use: Not Currently    Types: "Crack" cocaine   Sexual activity: Not on file  Other Topics Concern   Not on file  Social History Narrative   04/29/17   Lives in shelter   Caffeine- a lot of  tea, coffee   Social Determinants of Health   Financial Resource Strain: Not on file  Food Insecurity: Not on file  Transportation Needs: Not on file  Physical Activity: Not on file  Stress: Not on file  Social Connections: Not on file   SDOH:  SDOH Screenings   Depression (PHQ2-9): Low Risk  (03/06/2022)  Recent Concern: Depression (PHQ2-9) - Medium Risk (03/03/2022)  Tobacco Use: High Risk (03/01/2022)    Tobacco Cessation:  A prescription for an FDA-approved tobacco cessation medication was offered at discharge and the patient refused  Current Medications:  Current Facility-Administered Medications  Medication Dose Route Frequency Provider Last Rate Last Admin   acetaminophen (TYLENOL) tablet 650 mg  650 mg Oral Q6H PRN Bobbitt, Shalon E, NP   650 mg at 03/06/22 2110   alum & mag hydroxide-simeth (MAALOX/MYLANTA) 200-200-20 MG/5ML suspension 30 mL  30 mL Oral Q4H PRN Bobbitt, Shalon E, NP       amLODipine (NORVASC) tablet 10 mg  10 mg Oral Daily Bobbitt, Shalon E, NP   10 mg at 03/08/22 0957   atorvastatin (LIPITOR) tablet 40 mg  40 mg Oral Daily Bobbitt, Shalon E, NP   40 mg at 03/08/22 0957   DULoxetine  (CYMBALTA) DR capsule 60 mg  60 mg Oral Daily Bobbitt, Shalon E, NP   60 mg at 03/08/22 0957   famotidine (PEPCID) tablet 10 mg  10 mg Oral BID Bobbitt, Shalon E, NP   10 mg at 03/08/22 0957   gabapentin (NEURONTIN) capsule 600 mg  600 mg Oral TID Bobbitt, Shalon E, NP   600 mg at 03/08/22 0957   glipiZIDE (GLUCOTROL XL) 24 hr tablet 10 mg  10 mg Oral Q breakfast Bobbitt, Shalon E, NP   10 mg at 03/08/22 0829   hydrOXYzine (ATARAX) tablet 25 mg  25 mg Oral TID PRN Bobbitt, Shalon E, NP   25 mg at 03/06/22 2109   insulin aspart (novoLOG) injection 0-9 Units  0-9 Units Subcutaneous TID WC [name], [name], MD  3 Units at 03/08/22 0829   insulin aspart (novoLOG) injection 3 Units  3 Units Subcutaneous BID AC Bobbitt, Shalon E, NP   3 Units at 03/07/22 1152   insulin glargine-yfgn (SEMGLEE) injection 24 Units  24 Units Subcutaneous Daily Hampton Abbot, MD   24 Units at 03/08/22 0957   loperamide (IMODIUM) capsule 2-4 mg  2-4 mg Oral PRN Briant Cedar, MD       magnesium hydroxide (MILK OF MAGNESIA) suspension 30 mL  30 mL Oral Daily PRN Bobbitt, Shalon E, NP       metFORMIN (GLUCOPHAGE) tablet 1,000 mg  1,000 mg Oral BID WC Bobbitt, Shalon E, NP   1,000 mg at 03/08/22 0829   tamsulosin (FLOMAX) capsule 0.8 mg  0.8 mg Oral Daily Bobbitt, Shalon E, NP   0.8 mg at 03/08/22 0957   traZODone (DESYREL) tablet 100 mg  100 mg Oral QHS PRN Bobbitt, Shalon E, NP   100 mg at 03/07/22 2115   Current Outpatient Medications  Medication Sig Dispense Refill   amLODipine (NORVASC) 10 MG tablet Take 1 tablet (10 mg total) by mouth daily. 30 tablet 0   atorvastatin (LIPITOR) 40 MG tablet Take 1 tablet (40 mg total) by mouth daily. 30 tablet 0   Blood Glucose Monitoring Suppl (ACCU-CHEK GUIDE) w/Device KIT USE AS DIRECTED TO CHECK BLOOD SUGARS UP TO TWICE PER DAY 1 kit 0   DULoxetine (CYMBALTA) 60 MG capsule Take 1 capsule (60 mg total) by mouth daily. 30 capsule 0   famotidine (PEPCID) 10 MG tablet Take  1 tablet (10 mg total) by mouth 2 (two) times daily. 60 tablet 0   gabapentin (NEURONTIN) 300 MG capsule Take 2 capsules (600 mg total) by mouth 3 (three) times daily. 180 capsule 0   [START ON 03/09/2022] glipiZIDE (GLUCOTROL XL) 10 MG 24 hr tablet Take 1 tablet (10 mg total) by mouth daily with breakfast. 30 tablet 0   insulin aspart (NOVOLOG) 100 UNIT/ML injection Inject 3 Units into the skin 2 (two) times daily before lunch and supper. 10 mL 0   Insulin Glargine Solostar (LANTUS) 100 UNIT/ML Solostar Pen Inject 24 Units into the skin daily. 15 mL 2   metFORMIN (GLUCOPHAGE) 1000 MG tablet Take 1 tablet (1,000 mg total) by mouth 2 (two) times daily. 60 tablet 0   tamsulosin (FLOMAX) 0.4 MG CAPS capsule Take 2 capsules (0.8 mg total) by mouth daily. For renal calculi 60 capsule 0   traZODone (DESYREL) 100 MG tablet Take 1 tablet (100 mg total) by mouth at bedtime as needed for sleep. 30 tablet 0    PTA Medications: (Not in a hospital admission)      03/06/2022   11:59 AM 03/03/2022    9:47 AM 03/03/2022    7:11 AM  Depression screen PHQ 2/9  Decreased Interest 0 0 1  Down, Depressed, Hopeless 0 1 1  PHQ - 2 Score 0 1 2  Altered sleeping 0 1 1  Tired, decreased energy 0 0 1  Change in appetite 0 0 0  Feeling bad or failure about yourself  0 0   Trouble concentrating 0 0 0  Moving slowly or fidgety/restless 0 0 0  Suicidal thoughts 0 0 0  PHQ-9 Score 0 2 4  Difficult doing work/chores Not difficult at all Not difficult at all Somewhat difficult    Flowsheet Row ED from 03/02/2022 in [facility] Most recent reading at 03/03/2022  1:19 AM ED from 03/01/2022  in [place] Most recent reading at 03/01/2022  5:43 PM ED from 03/01/2022 in [place] Most recent reading at 03/01/2022 12:10 AM  C-SSRS RISK CATEGORY No Risk No Risk No Risk       Musculoskeletal  Strength &  Muscle Tone: within normal limits Gait & Station: normal Patient leans: N/A  Psychiatric Specialty Exam  Presentation  General Appearance:  Appropriate for Environment; Casual  Eye Contact: Good  Speech: Clear and Coherent; Normal Rate  Speech Volume: Normal  Handedness: Right   Mood and Affect  Mood: Anxious  Affect: Appropriate; Congruent   Thought Process  Thought Processes: Coherent; Goal Directed  Descriptions of Associations:Intact  Orientation:Full (Time, Place and Person)  Thought Content:Logical; WDL  Diagnosis of Schizophrenia or Schizoaffective disorder in past: No    Hallucinations:Hallucinations: None  Ideas of Reference:None  Suicidal Thoughts:Suicidal Thoughts: No  Homicidal Thoughts:Homicidal Thoughts: No   Sensorium  Memory: Immediate Good; Recent Good  Judgment: Fair  Insight: Fair   Community education officer  Concentration: Good  Attention Span: Good  Recall: Good  Fund of Knowledge: Good  Language: Good   Psychomotor Activity  Psychomotor Activity:Psychomotor Activity: Normal   Assets  Assets: Communication Skills; Desire for Improvement; Resilience   Sleep  Sleep:Sleep: Fair   No data recorded  Physical Exam  Constitutional:      Appearance: the patient is not toxic-appearing.  Pulmonary:     Effort: Pulmonary effort is normal.  Neurological:     General: No focal deficit present.     Mental Status: the patient is alert and answering questions appropriately Review of Systems  Respiratory:  Negative for shortness of breath.   Cardiovascular:  Negative for chest pain.  Gastrointestinal:  Negative for abdominal pain, constipation, diarrhea, nausea and vomiting.  Neurological:  Negative for headaches.   Blood pressure 134/82, pulse 71, temperature 98.4 F (36.9 C), temperature source Oral, resp. rate 18, SpO2 98 %. There is no height or weight on file to calculate BMI.  Demographic Factors:   Male, Low socioeconomic status, Living alone, and Unemployed  Loss Factors: Decline in physical health and Financial problems/change in socioeconomic status  Historical Factors: Impulsivity  Risk Reduction Factors:   Religious beliefs about death, Positive therapeutic relationship, and Positive coping skills or problem solving skills  Continued Clinical Symptoms:  Alcohol/Substance Abuse/Dependencies Previous Psychiatric Diagnoses and Treatments  Cognitive Features That Contribute To Risk:  Polarized thinking    Suicide Risk:  Mild: There are no identifiable plans, no associated intent, mild dysphoria and related symptoms, good self-control (both objective and subjective assessment), few other risk factors, and identifiable protective factors, including available and accessible social support.  Plan Of Care/Follow-up recommendations:  Follow-up recommendations:  Activity:  Normal, as tolerated Diet:  Per PCP recommendation  Patient is instructed prior to discharge to: Take all medications as prescribed by his mental healthcare provider. Report any adverse effects and/or reactions from the medicines to his outpatient provider promptly. Patient has been instructed & cautioned: To not engage in alcohol and or illegal drug use while on prescription medicines.  In the event of worsening symptoms, patient is instructed to call the crisis hotline at 988, 911 and or go to the nearest ED for appropriate evaluation and treatment of symptoms. To follow-up with his primary care provider for your other medical issues, concerns and or health care needs.   Disposition: Shelter  Rosezetta Schlatter, MD 03/08/2022, 11:28 AM

## 2022-03-08 NOTE — ED Notes (Signed)
Discharge instructions provided and Pt stated understanding. Pt alert, orient and ambulatory prior to d/c from facility. Personal belongings returned from locker number 17. Bus pass provided.  Safety maintained.

## 2022-03-08 NOTE — Discharge Planning (Signed)
Patient anticipated to discharge on today. Patient reports he will make a few more calls on today regarding housing options and shelter placement. Patient expressed appreciation for LCSW assistance while at the [facility]. Bus ticket to be provided to the patient prior to discharge. No other needs were reported at this time. LCSW to sign off.   Please inform if further LCSW needs arise prior to discharge.   Lucius Conn, LCSW Clinical Social Worker [facility] Ph: [phone]

## 2022-03-08 NOTE — ED Notes (Signed)
Patient is presently asleep in bed without issue or concern.  Will monitor and provide safety on unit.

## 2022-03-08 NOTE — ED Notes (Signed)
Pt is in the bed sleeping. Respirations are even and unlabored. No acute distress noted. Will continue to monitor for safety. 

## 2022-03-08 NOTE — ED Notes (Signed)
Pt sleeping.. resp even and unlabored. Will continue to monitor for safety

## 2022-03-16 ENCOUNTER — Emergency Department (HOSPITAL_COMMUNITY)
Admission: EM | Admit: 2022-03-16 | Discharge: 2022-03-16 | Disposition: A | Discharge status: Home / Self Care | Payer: Medicaid Other | Attending: Emergency Medicine | Admitting: Emergency Medicine

## 2022-03-16 DIAGNOSIS — I10 Essential (primary) hypertension: Secondary | ICD-10-CM | POA: Insufficient documentation

## 2022-03-16 DIAGNOSIS — Z79899 Other long term (current) drug therapy: Secondary | ICD-10-CM | POA: Diagnosis not present

## 2022-03-16 DIAGNOSIS — Z7984 Long term (current) use of oral hypoglycemic drugs: Secondary | ICD-10-CM | POA: Insufficient documentation

## 2022-03-16 DIAGNOSIS — Z794 Long term (current) use of insulin: Secondary | ICD-10-CM | POA: Diagnosis not present

## 2022-03-16 DIAGNOSIS — E119 Type 2 diabetes mellitus without complications: Secondary | ICD-10-CM | POA: Insufficient documentation

## 2022-03-16 DIAGNOSIS — M545 Low back pain, unspecified: Secondary | ICD-10-CM | POA: Diagnosis not present

## 2022-03-16 DIAGNOSIS — G8929 Other chronic pain: Secondary | ICD-10-CM | POA: Diagnosis not present

## 2022-03-16 DIAGNOSIS — M542 Cervicalgia: Secondary | ICD-10-CM | POA: Insufficient documentation

## 2022-03-16 DIAGNOSIS — M549 Dorsalgia, unspecified: Secondary | ICD-10-CM | POA: Diagnosis present

## 2022-03-16 LAB — CBG MONITORING, ED: Glucose-Capillary: 208 mg/dL — ABNORMAL HIGH (ref 70–99)

## 2022-03-16 MED ORDER — ACETAMINOPHEN 500 MG PO TABS
1000.0000 mg | ORAL_TABLET | Freq: Once | ORAL | Status: AC
  Administered 2022-03-16: 1000 mg via ORAL
  Filled 2022-03-16: qty 2

## 2022-03-16 NOTE — ED Provider Notes (Signed)
[facility] EMERGENCY DEPARTMENT Provider Note   CSN: [id number] Arrival date & time: 03/16/22  1024     History  Chief Complaint  Patient presents with   Neck Pain    David Wyatt is a 62 y.o. male.  Patient is a 62 year old male with a history of hypertension, diabetes and substance abuse who presents being cold and having back pain.  He has history of chronic neck and back pain and has had prior spinal surgery per his report.  He says he thinks being out in the cold has flared it up.  He denies any new symptoms.  No recent injuries.  No numbness or weakness to his extremities although he does use a walker to ambulate which he says is baseline for him.  No loss of bowel or bladder control.  No fevers.  He says he takes some pain pills but does not know what they are.  He is mostly concerned because he does not have a place to stay.       Home Medications Prior to Admission medications   Medication Sig Start Date End Date Taking? Authorizing Provider  amLODipine (NORVASC) 10 MG tablet Take 1 tablet (10 mg total) by mouth daily. 03/08/22 04/07/22  Rosezetta Schlatter, MD  atorvastatin (LIPITOR) 40 MG tablet Take 1 tablet (40 mg total) by mouth daily. 03/08/22 04/07/22  Rosezetta Schlatter, MD  Blood Glucose Monitoring Suppl (ACCU-CHEK GUIDE) w/Device KIT USE AS DIRECTED TO CHECK BLOOD SUGARS UP TO TWICE PER DAY 01/23/22   Merrily Brittle, DO  DULoxetine (CYMBALTA) 60 MG capsule Take 1 capsule (60 mg total) by mouth daily. 03/08/22 04/07/22  Rosezetta Schlatter, MD  famotidine (PEPCID) 10 MG tablet Take 1 tablet (10 mg total) by mouth 2 (two) times daily. 03/08/22 04/07/22  Rosezetta Schlatter, MD  gabapentin (NEURONTIN) 300 MG capsule Take 2 capsules (600 mg total) by mouth 3 (three) times daily. 03/08/22 04/07/22  Rosezetta Schlatter, MD  glipiZIDE (GLUCOTROL XL) 10 MG 24 hr tablet Take 1 tablet (10 mg total) by mouth daily with breakfast. 03/09/22 04/08/22  Rosezetta Schlatter, MD  insulin  aspart (NOVOLOG) 100 UNIT/ML injection Inject 3 Units into the skin 2 (two) times daily before lunch and supper. 02/05/22 07/22/22  Merrily Brittle, DO  Insulin Glargine Solostar (LANTUS) 100 UNIT/ML Solostar Pen Inject 24 Units into the skin daily. 01/23/22   Merrily Brittle, DO  metFORMIN (GLUCOPHAGE) 1000 MG tablet Take 1 tablet (1,000 mg total) by mouth 2 (two) times daily. 03/08/22 04/07/22  Rosezetta Schlatter, MD  tamsulosin (FLOMAX) 0.4 MG CAPS capsule Take 2 capsules (0.8 mg total) by mouth daily. For renal calculi 03/08/22 04/07/22  Rosezetta Schlatter, MD  traZODone (DESYREL) 100 MG tablet Take 1 tablet (100 mg total) by mouth at bedtime as needed for sleep. 03/08/22 04/07/22  Rosezetta Schlatter, MD  loratadine (CLARITIN) 10 MG tablet Take 1 tablet (10 mg total) by mouth daily. Patient not taking: No sig reported 06/03/17 07/20/20  [name], MD      Allergies    Lisinopril    Review of Systems   Review of Systems  Constitutional:  Negative for chills, diaphoresis, fatigue and fever.  HENT:  Negative for congestion, rhinorrhea and sneezing.   Eyes: Negative.   Respiratory:  Negative for cough, chest tightness and shortness of breath.   Cardiovascular:  Negative for chest pain and leg swelling.  Gastrointestinal:  Negative for abdominal pain, blood in stool, diarrhea, nausea and vomiting.  Genitourinary:  Negative for  difficulty urinating, flank pain, frequency and hematuria.  Musculoskeletal:  Positive for arthralgias, back pain and neck pain.  Skin:  Negative for rash.  Neurological:  Negative for dizziness, speech difficulty, weakness, numbness and headaches.    Physical Exam Updated Vital Signs BP (!) 130/92 (BP Location: Left Arm)   Pulse 63   Temp 98.8 F (37.1 C) (Oral)   Resp 17   SpO2 96%  Physical Exam Constitutional:      Appearance: He is well-developed.  HENT:     Head: Normocephalic and atraumatic.  Eyes:     Pupils: Pupils are equal, round, and reactive to light.   Cardiovascular:     Rate and Rhythm: Normal rate and regular rhythm.     Heart sounds: Normal heart sounds.  Pulmonary:     Effort: Pulmonary effort is normal. No respiratory distress.     Breath sounds: Normal breath sounds. No wheezing or rales.  Chest:     Chest wall: No tenderness.  Abdominal:     General: Bowel sounds are normal.     Palpations: Abdomen is soft.     Tenderness: There is no abdominal tenderness. There is no guarding or rebound.  Musculoskeletal:        General: Normal range of motion.     Cervical back: Normal range of motion and neck supple.     Comments: Tenderness throughout the neck and back, no specific area of spinal tenderness, no step-off deformities, negative straight leg raise bilaterally  Lymphadenopathy:     Cervical: No cervical adenopathy.  Skin:    General: Skin is warm and dry.     Findings: No rash.  Neurological:     Mental Status: He is alert and oriented to person, place, and time.     Comments: Motor 5 out of 5 all extremities, sensation grossly intact to light touch all extremities     ED Results / Procedures / Treatments   Labs (all labs ordered are listed, but only abnormal results are displayed) Labs Reviewed  CBG MONITORING, ED - Abnormal; Notable for the following components:      Result Value   Glucose-Capillary 208 (*)    All other components within normal limits    EKG None  Radiology No results found.  Procedures Procedures    Medications Ordered in ED Medications  acetaminophen (TYLENOL) tablet 1,000 mg (has no administration in time range)    ED Course/ Medical Decision Making/ A&P                           Medical Decision Making Risk OTC drugs.   Patient is a 62 year old male who presents with exacerbation of chronic back pain.  He does not have a specific area of tenderness.  He is neurologically intact.  No concerns of for cauda equina.  No fever or other red flags.  He does not have associate  abdominal pain or urinary symptoms that would warrant further imaging.  No recent trauma that would warrant x-rays.  His glucose is in the 200 range.  No signs of DKA.  He is otherwise well-appearing.  He states that he has all his medications to take including his diabetic medications.  I also see that he is on Neurontin.  Was discharged home in good condition.  Was given information about the [facility] that is open as a warming center currently.  Return precautions were given.  Final Clinical Impression(s) / ED Diagnoses  Final diagnoses:  Neck pain  Chronic bilateral low back pain without sciatica    Rx / DC Orders ED Discharge Orders     None         Malvin Johns, MD 03/16/22 1320

## 2022-03-16 NOTE — Care Management (Signed)
\  warming center open at [address]

## 2022-03-16 NOTE — ED Triage Notes (Signed)
Pt bib ems from being outside all night. Complaint of being cold. Pt also with chronic neck and back pain. Denies new injury or fall.  144/90 HR 70 97% RA CBG 120

## 2022-03-26 ENCOUNTER — Encounter: Payer: Self-pay | Admitting: Emergency Medicine

## 2022-03-26 ENCOUNTER — Ambulatory Visit
Admission: EM | Admit: 2022-03-26 | Discharge: 2022-03-26 | Disposition: A | Discharge status: Home / Self Care | Payer: Medicaid Other | Attending: Emergency Medicine | Admitting: Emergency Medicine

## 2022-03-26 ENCOUNTER — Other Ambulatory Visit: Payer: Self-pay

## 2022-03-26 DIAGNOSIS — E1165 Type 2 diabetes mellitus with hyperglycemia: Secondary | ICD-10-CM

## 2022-03-26 DIAGNOSIS — I10 Essential (primary) hypertension: Secondary | ICD-10-CM | POA: Diagnosis not present

## 2022-03-26 DIAGNOSIS — Z794 Long term (current) use of insulin: Secondary | ICD-10-CM

## 2022-03-26 LAB — POCT URINALYSIS DIP (MANUAL ENTRY)
Bilirubin, UA: NEGATIVE
Glucose, UA: 250 mg/dL — AB
Ketones, POC UA: NEGATIVE mg/dL
Nitrite, UA: NEGATIVE
Protein Ur, POC: 30 mg/dL — AB
Spec Grav, UA: 1.02 (ref 1.010–1.025)
Urobilinogen, UA: 0.2 E.U./dL
pH, UA: 6 (ref 5.0–8.0)

## 2022-03-26 LAB — POCT FASTING CBG KUC MANUAL ENTRY: POCT Glucose (KUC): 362 mg/dL — AB (ref 70–99)

## 2022-03-26 NOTE — ED Triage Notes (Signed)
Pt here for elevated BP and increased CBG; pt sts recently homeless and now in homeless shelter and sts meds in storage

## 2022-03-26 NOTE — Discharge Instructions (Signed)
Please restart your diabetes and blood pressure medications tomorrow.  Check your blood sugar and blood pressure daily. It is important to keep your blood pressure under good control, as having a elevated blood pressure for prolonged periods of time significantly increases your risk of stroke, heart attacks, kidney damage, eye damage, and other problems. Get a validated blood pressure cuff that goes on your arm, not your wrist.  Measure your blood pressure once a day, preferably at the same time every day. Keep a log of this and bring it to your next doctor's appointment.  Bring your blood pressure cuff as well. Return immediately to the ER if you start having chest pain, headache, problems seeing, problems talking, problems walking, if you feel like you're about to pass out, if you do pass out, if you have a seizure, or for any other concerns.  Go to www.goodrx.com  or www.costplusdrugs.com to look up your medications. This will give you a list of where you can find your prescriptions at the most affordable prices. Or ask the pharmacist what the cash price is, or if they have any other discount programs available to help make your medication more affordable. This can be less expensive than what you would pay with insurance.

## 2022-03-26 NOTE — ED Provider Notes (Signed)
HPI  SUBJECTIVE:  David Wyatt is a 62 y.o. male who presents with elevated glucose in the 300s, blood pressure systolic 650 read by the Congregational nurse today.  He has been homeless for "a while" and states that all of his medications are in storage.  He is afraid to carry them around with him as he is afraid that they will be stolen.  He states that he has not had his blood pressure or diabetic medications in over 2 weeks.  He states that he will be staying at [facility] as of tomorrow, and will be able to take his medications as prescribed and check his glucose regularly starting tomorrow.  He states that [employer] will check his blood pressure daily.  He reports increased thirst and some polyuria, no abdominal pain, nausea or vomiting.  Denies headache, visual changes, slurred speech, facial droop, arm or leg weakness, chest pain, palpitations, shortness of breath, tearing pain through to his back, abdominal pain, syncope, seizures, lower extremity edema, anuria, hematuria.  He has a past medical history of diabetes, hypertension, hypercholesterolemia, chronic back pain, cocaine use.  States that he has been clean for some time.  No history of DKA, coronary disease, MI, CVA, chronic kidney disease.  PCP [facility].   Past Medical History:  Diagnosis Date   Angioedema of lips 07/28/2012   left upper (07/29/2012)   Arthritis    hands and back   Chronic back pain    Cocaine abuse (HCC)    Diabetic neuropathy (HCC)    High cholesterol    Hypertension    Neuropathy    Type II diabetes mellitus ([place])     Past Surgical History:  Procedure Laterality Date   BACK SURGERY     CYSTOSCOPY W/ URETERAL STENT PLACEMENT Right 02/23/2021   Procedure: CYSTOSCOPY WITH RETROGRADE PYELOGRAM/URETERAL STENT PLACEMENT;  Surgeon: Janith Lima, MD;  [facility];  Service: Urology;  Laterality: Right;   HERNIA REPAIR Right 04/01/2012   I & D EXTREMITY Left  12/13/2012   Procedure: IRRIGATION AND DEBRIDEMENT LEFT THUMB;  Surgeon: Tennis Must, MD;  Location: [place];  Service: Orthopedics;  Laterality: Left;   INCISION AND DRAINAGE OF WOUND     boil on back/notes 07/14/2008 (07/29/2012)   INGUINAL HERNIA REPAIR  04/01/2012   Procedure: HERNIA REPAIR INGUINAL ADULT;  Surgeon: Haywood Lasso, MD;  Location: [place];  Service: General;  Laterality: Right;   INGUINAL HERNIA REPAIR Left 09/06/2016   Procedure: OPEN REPAIR LEFT INGUINAL HERNIA;  Surgeon: Greer Pickerel, MD;  Location: [place];  Service: General;  Laterality: Left;   INSERTION OF MESH Left 09/06/2016   Procedure: INSERTION OF MESH;  Surgeon: Greer Pickerel, MD;  Location: [place];  Service: General;  Laterality: Left;   TONSILLECTOMY      Family History  Problem Relation Age of Onset   Diabetes Mother    Kidney disease Mother    Hyperlipidemia Mother    Hyperlipidemia Father    Diabetes Father     Social History   Tobacco Use   Smoking status: Every Day    Packs/day: 0.25    Years: 30.00    Total pack years: 7.50    Types: Cigarettes   Smokeless tobacco: Former    Quit date: 08/15/2015   Tobacco comments:    04/29/16 2  cigs daily, 10/27/17 sometimes < .25 PPD  Vaping Use   Vaping Use: Never used  Substance Use  Topics   Alcohol use: No    Alcohol/week: 0.0 standard drinks of alcohol   Drug use: Not Currently    Types: "Crack" cocaine    No current facility-administered medications for this encounter.  Current Outpatient Medications:    amLODipine (NORVASC) 10 MG tablet, Take 1 tablet (10 mg total) by mouth daily., Disp: 30 tablet, Rfl: 0   atorvastatin (LIPITOR) 40 MG tablet, Take 1 tablet (40 mg total) by mouth daily., Disp: 30 tablet, Rfl: 0   Blood Glucose Monitoring Suppl (ACCU-CHEK GUIDE) w/Device KIT, USE AS DIRECTED TO CHECK BLOOD SUGARS UP TO TWICE PER DAY, Disp: 1 kit, Rfl: 0   DULoxetine (CYMBALTA) 60 MG capsule, Take 1 capsule (60 mg total) by mouth daily., Disp: 30  capsule, Rfl: 0   famotidine (PEPCID) 10 MG tablet, Take 1 tablet (10 mg total) by mouth 2 (two) times daily., Disp: 60 tablet, Rfl: 0   gabapentin (NEURONTIN) 300 MG capsule, Take 2 capsules (600 mg total) by mouth 3 (three) times daily., Disp: 180 capsule, Rfl: 0   glipiZIDE (GLUCOTROL XL) 10 MG 24 hr tablet, Take 1 tablet (10 mg total) by mouth daily with breakfast., Disp: 30 tablet, Rfl: 0   insulin aspart (NOVOLOG) 100 UNIT/ML injection, Inject 3 Units into the skin 2 (two) times daily before lunch and supper., Disp: 10 mL, Rfl: 0   Insulin Glargine Solostar (LANTUS) 100 UNIT/ML Solostar Pen, Inject 24 Units into the skin daily., Disp: 15 mL, Rfl: 2   metFORMIN (GLUCOPHAGE) 1000 MG tablet, Take 1 tablet (1,000 mg total) by mouth 2 (two) times daily., Disp: 60 tablet, Rfl: 0   tamsulosin (FLOMAX) 0.4 MG CAPS capsule, Take 2 capsules (0.8 mg total) by mouth daily. For renal calculi, Disp: 60 capsule, Rfl: 0   traZODone (DESYREL) 100 MG tablet, Take 1 tablet (100 mg total) by mouth at bedtime as needed for sleep., Disp: 30 tablet, Rfl: 0  Allergies  Allergen Reactions   Lisinopril Other (See Comments) and Swelling    Lip Swelling, angioedema  angioedema  Lip Swelling  Lip Swelling, angioedema     ROS  As noted in HPI.   Physical Exam  BP (!) 163/89 (BP Location: Left Arm)   Pulse 75   Temp 98.6 F (37 C) (Oral)   Resp 18   SpO2 95%   Constitutional: Well developed, well nourished, no acute distress Eyes:  EOMI, conjunctiva normal bilaterally HENT: Normocephalic, atraumatic,mucus membranes moist Respiratory: Normal inspiratory effort, lungs clear bilaterally Cardiovascular: Normal rate, regular rhythm, no murmurs GI: nondistended skin: No rash, skin intact Musculoskeletal: no deformities Neurologic: Alert & oriented x 3, no focal neuro deficits Psychiatric: Speech and behavior appropriate   ED Course   Medications - No data to display  Orders Placed This Encounter   Procedures   POCT CBG (manual entry)    Standing Status:   Standing    Number of Occurrences:   1   POCT urinalysis dipstick    Standing Status:   Standing    Number of Occurrences:   1    Results for orders placed or performed during the hospital encounter of 03/26/22 (from the past 24 hour(s))  POCT urinalysis dipstick     Status: Abnormal   Collection Time: 03/26/22  3:31 PM  Result Value Ref Range   Color, UA yellow yellow   Clarity, UA cloudy (A) clear   Glucose, UA =250 (A) negative mg/dL   Bilirubin, UA negative negative   Ketones, POC  UA negative negative mg/dL   Spec Grav, UA 1.020 1.010 - 1.025   Blood, UA trace-intact (A) negative   pH, UA 6.0 5.0 - 8.0   Protein Ur, POC =30 (A) negative mg/dL   Urobilinogen, UA 0.2 0.2 or 1.0 E.U./dL   Nitrite, UA Negative Negative   Leukocytes, UA Small (1+) (A) Negative  POCT CBG (manual entry)     Status: Abnormal   Collection Time: 03/26/22  3:33 PM  Result Value Ref Range   POCT Glucose ([facility]) 362 (A) 70 - 99 mg/dL   No results found.  ED Clinical Impression  1. Type 2 diabetes mellitus with hyperglycemia, with long-term current use of insulin (HCC)   2. Elevated blood pressure reading in office with diagnosis of hypertension      ED Assessment/Plan     Fingerstick 362.  He has some glucosurea.   He has symptoms of uncontrolled diabetes, but he does not have ketones in his urine.  Doubt DKA at this time.  Small leukocytes noted, but patient has no urinary complaints.  Will not send off for culture.    Blood pressure noted.  Patient has no hypertensive emergency red flags as noted in HPI.   Patient appears to have a good plan in place, he has somewhere to stay as of tomorrow, he will have access to his medications, he understands how to take them, and states that he has plenty of his medications.  He will check his glucose blood pressure daily.  He is wanting to improve his life, and is wondering where he might turn to  get additional resources.  Advised him to follow-up with social workers at [employer] and wellness.   Discussed labs,  MDM, treatment plan, and plan for follow-up with patient. Discussed sn/sx that should prompt return to the ED. patient agrees with plan.   No orders of the defined types were placed in this encounter.     *This clinic note was created using Dragon dictation software. Therefore, there may be occasional mistakes despite careful proofreading.    Melynda Ripple, MD 03/26/22 1544

## 2022-03-27 ENCOUNTER — Encounter (HOSPITAL_COMMUNITY): Payer: Self-pay

## 2022-03-27 ENCOUNTER — Ambulatory Visit (HOSPITAL_COMMUNITY)
Admission: EM | Admit: 2022-03-27 | Discharge: 2022-03-27 | Disposition: A | Discharge status: Home / Self Care | Payer: Medicaid Other | Attending: Family Medicine | Admitting: Family Medicine

## 2022-03-27 ENCOUNTER — Encounter: Payer: Self-pay | Admitting: Physician Assistant

## 2022-03-27 DIAGNOSIS — E1165 Type 2 diabetes mellitus with hyperglycemia: Secondary | ICD-10-CM

## 2022-03-27 DIAGNOSIS — Z794 Long term (current) use of insulin: Secondary | ICD-10-CM | POA: Diagnosis not present

## 2022-03-27 DIAGNOSIS — E0865 Diabetes mellitus due to underlying condition with hyperglycemia: Secondary | ICD-10-CM

## 2022-03-27 LAB — GLUCOSE, POCT (MANUAL RESULT ENTRY): POC Glucose: 374 mg/dl — AB (ref 70–99)

## 2022-03-27 LAB — CBG MONITORING, ED: Glucose-Capillary: 356 mg/dL — ABNORMAL HIGH (ref 70–99)

## 2022-03-27 MED ORDER — INSULIN ASPART 100 UNIT/ML IJ SOLN
INTRAMUSCULAR | Status: AC
  Filled 2022-03-27: qty 1

## 2022-03-27 MED ORDER — INSULIN ASPART 100 UNIT/ML IJ SOLN
8.0000 [IU] | Freq: Once | INTRAMUSCULAR | Status: AC
  Administered 2022-03-27: 8 [IU] via SUBCUTANEOUS

## 2022-03-27 NOTE — Congregational Nurse Program (Signed)
  Dept: [phone]   Congregational Nurse Program Note  Date of Encounter: 03/26/2022  Clinic visit for blood pressure check and complaint of not feeling well.  States he has not been taking his medications for sometime but unable to give accurate information on number of days medications missed.  Slept on floor at [facility] program building previous night and has back pain.  BP 154/94, pulse 88, O2 sat 99%.  Blood glucose 374 approximately one hour after lunch.  Called [facility] and arranged for transportation for him to go for evaluation. Past Medical History: Past Medical History:  Diagnosis Date   Angioedema of lips 07/28/2012   left upper (07/29/2012)   Arthritis    hands and back   Chronic back pain    Cocaine abuse ([place])    Diabetic neuropathy (HCC)    High cholesterol    Hypertension    Neuropathy    Type II diabetes mellitus ([place])     Encounter Details:  CNP Questionnaire - 03/26/22 1155       Questionnaire   Ask client: Do you give verbal consent for me to treat you today? Yes    Student Assistance N/A    Location Patient [facility]    Visit Setting with Client Organization    Patient Status Unhoused    Insurance Medicaid    Insurance/Financial Assistance Referral N/A    Medication Have Medication Insecurities    Medical Provider Yes    Screening Referrals Made N/A    Medical Referrals Made Urgent Care    Medical Appointment Made N/A    Recently w/o PCP, now 1st time PCP visit completed due to CNs referral or appointment made N/A    Food Have Food Insecurities    Transportation Provided transportation assistance;Need transportation assistance    Housing/Utilities No permanent housing    Interpersonal Safety Do not feel safe at current residence    Interventions Advocate/Support;Counsel;Educate;Reviewed Medications;Navigate Healthcare System;Spiritual Care    Abnormal to Normal Screening Since Last CN Visit N/A    Screenings CN Performed Blood  Pressure;Blood Glucose;Pulse Ox    Sent Client to Lab for: N/A    Did client attend any of the following based off CNs referral or appointments made? N/A    ED Visit Averted Yes    Life-Saving Intervention Made N/A

## 2022-03-27 NOTE — Discharge Instructions (Signed)
Make sure to re-start your diabetes medications including insulin in addition to your blood pressure medications tomorrow morning.  Meds ordered this encounter  Medications   insulin aspart (novoLOG) injection 8 Units

## 2022-03-27 NOTE — Progress Notes (Addendum)
Plans to get meds tomorrow, but transportation is an issue.  See ER noted from 01/17, gives a good summary of the situation.   Today's Vitals   03/28/22 1726  BP: (!) 145/81   There is no height or weight on file to calculate BMI.  . CBG 454  Pt is weak, has frequent urination, generally does not feel well. LOC is good.   Has R arm problems because he needs rotator cuff surgery.   Called Dr Mannie Stabile at [facility], requested he give the pt enough meds to last until he can get his meds out of storage. MD says they can evaluate him and bring his CBG down.  Situation reviewed w/ pt, he will be transported to [facility] and promises to get his meds tomorrow.   Rosaria Ferries, PA-C 03/27/2022 4:20 PM

## 2022-03-27 NOTE — ED Triage Notes (Signed)
Pt was sent to urgent care due to high blood sugar

## 2022-03-28 ENCOUNTER — Other Ambulatory Visit: Payer: Self-pay

## 2022-03-28 ENCOUNTER — Other Ambulatory Visit: Payer: Self-pay | Admitting: Physician Assistant

## 2022-03-28 ENCOUNTER — Other Ambulatory Visit (HOSPITAL_COMMUNITY): Payer: Self-pay

## 2022-03-28 DIAGNOSIS — E1165 Type 2 diabetes mellitus with hyperglycemia: Secondary | ICD-10-CM

## 2022-03-28 LAB — GLUCOSE, POCT (MANUAL RESULT ENTRY): POC Glucose: 404 mg/dl — AB (ref 70–99)

## 2022-03-28 MED ORDER — INSULIN ASPART 100 UNIT/ML IJ SOLN: 3.0000 [IU] | Freq: Two times a day (BID) | INTRAMUSCULAR | 0 refills | Status: DC

## 2022-03-28 MED ORDER — INSULIN GLARGINE SOLOSTAR 100 UNIT/ML ~~LOC~~ SOPN: 30.0000 [IU] | PEN_INJECTOR | Freq: Every day | SUBCUTANEOUS | 2 refills | Status: DC

## 2022-03-28 MED ORDER — "INSULIN SYRINGE-NEEDLE U-100 31G X 5/16"" 0.5 ML MISC"
1.0000 [IU] | Freq: Four times a day (QID) | 0 refills | Status: DC
  Filled 2022-03-28: qty 100, 25d supply, fill #0

## 2022-03-28 NOTE — ED Provider Notes (Signed)
[place]   [id number] 03/27/22 Arrival Time: 2423  ASSESSMENT & PLAN:  1. Type 2 diabetes mellitus with hyperglycemia, with long-term current use of insulin ([place])   2. Diabetes mellitus due to underlying condition with hyperglycemia, with long-term current use of insulin (HCC)    Labs Reviewed  CBG MONITORING, ED - Abnormal; Notable for the following components:      Result Value   Glucose-Capillary 356 (*)    All other components within normal limits   Without s/s of DKA. No n/v/d. Benign abdomen.  Meds ordered this encounter  Medications   insulin aspart (novoLOG) injection 8 Units  BP (!) 158/102 (BP Location: Left Arm)   Pulse 85   Temp 98.2 F (36.8 C) (Oral)   Resp 16   SpO2 95%   Reports he will be able to access his medications (DM and HTN) in the morning. Has ability to recheck BP and blood sugars in the morning. Has PCP f/u on 04/16/22. May return here as needed.  Discussed typical duration of symptoms for suspected viral GI illness. Will do his best to ensure adequate fluid intake in order to avoid dehydration. Will proceed to the Emergency Department for evaluation if unable to tolerate PO fluids regularly.  Otherwise he will f/u with his PCP or here if not showing improvement over the next 48-72 hours.  Reviewed expectations re: course of current medical issues. Questions answered. Outlined signs and symptoms indicating need for more acute intervention. Patient verbalized understanding. After Visit Summary given.   SUBJECTIVE: History from: patient.  David Wyatt is a 62 y.o. male who is here secondary to elevated blood sugar noted by cardiology. Reports that his medicines are in a locker that he will be able to get to in the morning. Denies recent illness. Sleeping at a shelter currently. Without n/v/d. BP was also noted to elevated. Denies CP/SOB. Has meal here with him ready to eat. Past Surgical History:  Procedure Laterality Date   BACK  SURGERY     CYSTOSCOPY W/ URETERAL STENT PLACEMENT Right 02/23/2021   Procedure: CYSTOSCOPY WITH RETROGRADE PYELOGRAM/URETERAL STENT PLACEMENT;  Surgeon: Janith Lima, MD;  [facility];  Service: Urology;  Laterality: Right;   HERNIA REPAIR Right 04/01/2012   I & D EXTREMITY Left 12/13/2012   Procedure: IRRIGATION AND DEBRIDEMENT LEFT THUMB;  Surgeon: Tennis Must, MD;  Location: [place];  Service: Orthopedics;  Laterality: Left;   INCISION AND DRAINAGE OF WOUND     boil on back/notes 07/14/2008 (07/29/2012)   INGUINAL HERNIA REPAIR  04/01/2012   Procedure: HERNIA REPAIR INGUINAL ADULT;  Surgeon: Haywood Lasso, MD;  Location: [place];  Service: General;  Laterality: Right;   INGUINAL HERNIA REPAIR Left 09/06/2016   Procedure: OPEN REPAIR LEFT INGUINAL HERNIA;  Surgeon: Greer Pickerel, MD;  Location: [place];  Service: General;  Laterality: Left;   INSERTION OF MESH Left 09/06/2016   Procedure: INSERTION OF MESH;  Surgeon: Greer Pickerel, MD;  Location: [place];  Service: General;  Laterality: Left;   TONSILLECTOMY      OBJECTIVE:  Vitals:   03/27/22 1742  BP: (!) 158/102  Pulse: 85  Resp: 16  Temp: 98.2 F (36.8 C)  TempSrc: Oral  SpO2: 95%    Elevated BP noted.  General appearance: alert; no distress Oropharynx: moist Lungs: clear to auscultation bilaterally; unlabored Heart: regular rate and rhythm Abdomen: soft; non-distended; no significant abdominal tenderness; bowel sounds present; no masses or organomegaly; no guarding  or rebound tenderness Back: no CVA tenderness Extremities: no edema; symmetrical with no gross deformities Skin: warm; dry Neurologic: normal gait Psychological: alert and cooperative; normal mood and affect  Labs: Results for orders placed or performed during the hospital encounter of 03/27/22  POC CBG monitoring  Result Value Ref Range   Glucose-Capillary 356 (H) 70 - 99 mg/dL   Labs Reviewed  CBG MONITORING, ED - Abnormal; Notable for the following  components:      Result Value   Glucose-Capillary 356 (*)    All other components within normal limits     Allergies  Allergen Reactions   Lisinopril Other (See Comments) and Swelling    Lip Swelling, angioedema  angioedema  Lip Swelling  Lip Swelling, angioedema                                               Past Medical History:  Diagnosis Date   Angioedema of lips 07/28/2012   left upper (07/29/2012)   Arthritis    hands and back   Chronic back pain    Cocaine abuse (HCC)    Diabetic neuropathy (HCC)    High cholesterol    Hypertension    Neuropathy    Type II diabetes mellitus ([place])    Social History   Socioeconomic History   Marital status: Divorced    Spouse name: Not on file   Number of children: 3   Years of education: 12   Highest education level: Not on file  Occupational History    Comment: disabled  Tobacco Use   Smoking status: Every Day    Packs/day: 0.25    Years: 30.00    Total pack years: 7.50    Types: Cigarettes   Smokeless tobacco: Former    Quit date: 08/15/2015   Tobacco comments:    04/29/16 2  cigs daily, 10/27/17 sometimes < .25 PPD  Vaping Use   Vaping Use: Never used  Substance and Sexual Activity   Alcohol use: No    Alcohol/week: 0.0 standard drinks of alcohol   Drug use: Not Currently    Types: "Crack" cocaine   Sexual activity: Not on file  Other Topics Concern   Not on file  Social History Narrative   04/29/17   Lives in shelter   Caffeine- a lot of  tea, coffee   Social Determinants of Health   Financial Resource Strain: Not on file  Food Insecurity: Not on file  Transportation Needs: Not on file  Physical Activity: Not on file  Stress: Not on file  Social Connections: Not on file  Intimate Partner Violence: Not on file   Family History  Problem Relation Age of Onset   Diabetes Mother    Kidney disease Mother    Hyperlipidemia Mother    Hyperlipidemia Father    Diabetes Father       Vanessa Kick,  MD 03/28/22 1000    Vanessa Kick, MD 04/03/22 1243

## 2022-03-28 NOTE — Progress Notes (Signed)
Pt CBG 404.  He got Novolog 8 u last pm in the ER.  He has not had any medicines this morning.  He is getting ready to take his oral medications and Lantus.  He picked up his medications from storage, but the NovoLog insulin was not in there.  He does not know where it is.  He had a carb heavy lunch and does not stick to a diabetic diet.  Recommended he take the oral medications now.  I reordered the NovoLog.  I increased his Lantus dose to 30 units daily.  I requested that the RN weight until later in the afternoon noon to give him the Lantus but make sure he takes it before the end of the day.  Rosaria Ferries, PA-C 03/28/2022 1:59 PM

## 2022-03-28 NOTE — Addendum Note (Signed)
Addended by: Lonn Georgia on: 03/28/2022 02:30 PM   Modules accepted: Orders

## 2022-03-28 NOTE — Congregational Nurse Program (Signed)
  Dept: [phone]   Congregational Nurse Program Note  Date of Encounter: 03/28/2022  Clinic visit to check blood pressure and blood sugar.  Went to urgent care yesterday, blood sugar over 400, 2hr pc breakfast 327 this morning.  Has not had regular medication yet.  Given bus pass to go pick up medications.  Sent note to [facility] physician assistant. Past Medical History: Past Medical History:  Diagnosis Date   Angioedema of lips 07/28/2012   left upper (07/29/2012)   Arthritis    hands and back   Chronic back pain    Cocaine abuse ([place])    Diabetic neuropathy (HCC)    High cholesterol    Hypertension    Neuropathy    Type II diabetes mellitus ([place])     Encounter Details:  CNP Questionnaire - 03/28/22 1030       Questionnaire   Ask client: Do you give verbal consent for me to treat you today? Yes    Student Assistance [employer] Nurse    Location Patient [facility]    Visit Setting with Client Organization    Patient Status Unhoused    Insurance Medicaid    Insurance/Financial Assistance Referral N/A    Medication Have Medication Insecurities    Medical Provider Yes    Screening Referrals Made N/A    Medical Referrals Made [facility]    Medical Appointment Made N/A    Recently w/o PCP, now 1st time PCP visit completed due to CNs referral or appointment made N/A    Food Have Food Insecurities    Transportation Provided transportation assistance;Need transportation assistance    Housing/Utilities No permanent housing    Interpersonal Safety Do not feel safe at current residence    Interventions Advocate/Support;Counsel;Educate;Spiritual Care    Abnormal to Normal Screening Since Last CN Visit N/A    Screenings CN Performed Blood Pressure;Blood Glucose;Pulse Ox    Sent Client to Lab for: N/A    Did client attend any of the following based off CNs referral or appointments made? N/A    ED Visit Averted N/A    Life-Saving Intervention Made N/A

## 2022-03-29 ENCOUNTER — Other Ambulatory Visit: Payer: Self-pay | Admitting: Pharmacist

## 2022-03-29 ENCOUNTER — Other Ambulatory Visit: Payer: Self-pay | Admitting: Critical Care Medicine

## 2022-03-29 ENCOUNTER — Other Ambulatory Visit: Payer: Self-pay

## 2022-03-29 MED ORDER — INSULIN GLARGINE SOLOSTAR 100 UNIT/ML ~~LOC~~ SOPN
30.0000 [IU] | PEN_INJECTOR | Freq: Every day | SUBCUTANEOUS | 0 refills | Status: DC
  Filled 2022-03-29: qty 15, 50d supply, fill #0

## 2022-03-29 MED ORDER — INSULIN LISPRO (1 UNIT DIAL) 100 UNIT/ML (KWIKPEN)
2.0000 [IU] | PEN_INJECTOR | Freq: Two times a day (BID) | SUBCUTANEOUS | 0 refills | Status: DC
  Filled 2022-03-29: qty 9, 84d supply, fill #0

## 2022-03-29 NOTE — Congregational Nurse Program (Signed)
  Dept: [phone]   Congregational Nurse Program Note  Date of Encounter: 03/28/2022  Clinic visit to recheck blood glucose pc lunch and to review medications that he brought to clinic.  Blood glucose 404, notified R. Barrett, PA of level and that medicines had not been taken for several days.  Assisted with giving oral medicines per PA's directions and he was to take 30 units of Lantus 2 hours after oral meds.  Discussed medicine issues with [facility] case manager and she provided bag for medicines to be stored by staff.  Educated resident to ask for medicines before breakfast, at lunch and dinner times. Past Medical History: Past Medical History:  Diagnosis Date   Angioedema of lips 07/28/2012   left upper (07/29/2012)   Arthritis    hands and back   Chronic back pain    Cocaine abuse ([place])    Diabetic neuropathy ([place])    High cholesterol    Hypertension    Neuropathy    Type II diabetes mellitus ([place])     Encounter Details:  CNP Questionnaire - 03/28/22 1329       Questionnaire   Ask client: Do you give verbal consent for me to treat you today? Yes    Student Assistance [employer] Nurse    Location Patient [facility]    Visit Setting with Client Organization    Patient Status Unhoused    Insurance Medicaid    Insurance/Financial Assistance Referral N/A    Medication Have Medication Insecurities    Medical Provider Yes    Screening Referrals Made N/A    Medical Referrals Made [facility]    Medical Appointment Made N/A    Recently w/o PCP, now 1st time PCP visit completed due to CNs referral or appointment made N/A    Food Have Food Insecurities    Transportation Provided transportation assistance;Need transportation assistance    Housing/Utilities No permanent housing    Interpersonal Safety Do not feel safe at current residence    Interventions Advocate/Support;Counsel;Educate;Reviewed Medications    Abnormal to Normal Screening Since Last CN Visit N/A     Screenings CN Performed Blood Pressure;Blood Glucose;Pulse Ox    Sent Client to Lab for: N/A    Did client attend any of the following based off CNs referral or appointments made? N/A    ED Visit Averted Yes    Life-Saving Intervention Made N/A

## 2022-04-01 ENCOUNTER — Other Ambulatory Visit: Payer: Self-pay

## 2022-04-08 ENCOUNTER — Ambulatory Visit: Payer: Self-pay | Admitting: *Deleted

## 2022-04-08 NOTE — Telephone Encounter (Addendum)
Patient requesting an earlier appointment. Appointment given for 04/10/2022 at 10:30 am. Patient voiced that he would attend appointment.

## 2022-04-08 NOTE — Telephone Encounter (Signed)
Patient called, left VM to return the call to the office to speak with a nurse.     Summary: shoulder discomfort / sleep concern   The patient shares that they are experiencing shoulder discomfort making it very difficult for them to sleep  The patient shares that they have an ongoing history of right rotator cuff issues  They would like to speak with a member of clinical staff further when possible

## 2022-04-08 NOTE — Telephone Encounter (Signed)
shoulder discomfort / sleep concern   The patient shares that they are experiencing shoulder discomfort making it very difficult for them to sleep  The patient shares that they have an ongoing history of right rotator cuff issues  They would like to speak with a member of clinical staff further when possible

## 2022-04-08 NOTE — Telephone Encounter (Signed)
Chief Complaint: Right shoulder pain Symptoms: Numbness to right arm, unable to sleep due to the pain, 10/10 pain Frequency: Ongoing several months Pertinent Negatives: Patient denies other symptoms Disposition: '[]'$ ED /'[x]'$ Urgent Care (no appt availability in office) / '[]'$ Appointment(In office/virtual)/ '[]'$  [facility]/ '[]'$ Home Care/ '[]'$ Refused Recommended Disposition /'[]'$ [place] [facility]/ '[]'$  Follow-up with PCP Additional Notes: Patient says he needs to have rotator cuff surgery to his right arm as told by Dr. Joya Gaskins and due to the fact he's in a shelter, he's not able to have it. He says once he's at a stable home, he can have the surgery, but now he needs something done to help the pain and help him rest. He says he was taking pain pills but ran out. Advised to go to [facility] in [place] or go to the closest Urgent care to him. He verbalized understanding.    Summary: shoulder discomfort / sleep concern   The patient shares that they are experiencing shoulder discomfort making it very difficult for them to sleep  The patient shares that they have an ongoing history of right rotator cuff issues  They would like to speak with a member of clinical staff further when possible       Reason for Disposition  [1] Shoulder pains with exertion (e.g., walking) AND [2] pain goes away on resting AND [3] not present now  Answer Assessment - Initial Assessment Questions 1. ONSET: "When did the pain start?"     A couple of months 2. LOCATION: "Where is the pain located?"     Right shoulder 3. PAIN: "How bad is the pain?" (Scale 1-10; or mild, moderate, severe)   - MILD (1-3): doesn't interfere with normal activities   - MODERATE (4-7): interferes with normal activities (e.g., work or school) or awakens from sleep   - SEVERE (8-10): excruciating pain, unable to do any normal activities, unable to move arm at all due to pain     10 4. WORK OR EXERCISE: "Has there been any  recent work or exercise that involved this part of the body?"     Need to have rotator cuff surgery per Dr. Joya Gaskins 5. CAUSE: "What do you think is causing the shoulder pain?"     Need to have rotator cuff surgery 6. OTHER SYMPTOMS: "Do you have any other symptoms?" (e.g., neck pain, swelling, rash, fever, numbness, weakness)     Numbness of the arm  Protocols used: Shoulder Pain-A-AH

## 2022-04-09 ENCOUNTER — Telehealth: Payer: Self-pay | Admitting: Emergency Medicine

## 2022-04-09 DIAGNOSIS — E1165 Type 2 diabetes mellitus with hyperglycemia: Secondary | ICD-10-CM

## 2022-04-09 LAB — GLUCOSE, POCT (MANUAL RESULT ENTRY): POC Glucose: 156 mg/dl — AB (ref 70–99)

## 2022-04-09 NOTE — Telephone Encounter (Signed)
Copied from Blue [id number]. Topic: Appointment Scheduling - Scheduling Inquiry for Clinic >> Apr 09, 2022  4:07 PM Penni Bombard wrote: Reason for CRM: Benard Halsted with [facility] wants to let Dr. Margarita Rana know he is out of Pepsid and no needles to go with his insulin pens.  CB#  [phone]

## 2022-04-09 NOTE — Congregational Nurse Program (Signed)
  Dept: [phone]   Congregational Nurse Program Note  Date of Encounter: 04/09/2022  Clinic visit for blood pressure and blood glucose checks.  BP 124/82, pulse 95 and regular, O2 Sat 94%.  Has taken medication consistently since last seen in [facility].  Blood glucose 156 two hours pc breakfast.  Is out of Pepcid and needles for his insulin pens.  Left message for MD regarding needles and the medication, arranged for transportation for the appointment. Past Medical History: Past Medical History:  Diagnosis Date   Angioedema of lips 07/28/2012   left upper (07/29/2012)   Arthritis    hands and back   Chronic back pain    Cocaine abuse ([place])    Diabetic neuropathy (HCC)    High cholesterol    Hypertension    Neuropathy    Type II diabetes mellitus ([place])     Encounter Details:  CNP Questionnaire - 04/09/22 0920       Questionnaire   Ask client: Do you give verbal consent for me to treat you today? Yes    Student Assistance N/A    Location Patient [facility]    Visit Setting with Client Organization    Patient Status Unhoused    Insurance Medicaid    Insurance/Financial Assistance Referral N/A    Medication Have Medication Insecurities    Medical Provider Yes    Screening Referrals Made N/A    Medical Referrals Made N/A    Medical Appointment Made N/A    Recently w/o PCP, now 1st time PCP visit completed due to CNs referral or appointment made N/A    Food Have Food Insecurities    Transportation Provided transportation assistance;Need transportation assistance    Housing/Utilities No permanent housing    Interpersonal Safety Do not feel safe at current residence    Interventions Advocate/Support;Counsel;Educate;Reviewed Medications    Abnormal to Normal Screening Since Last CN Visit N/A    Screenings CN Performed Blood Pressure;Blood Glucose;Pulse Ox    Sent Client to Lab for: N/A    Did client attend any of the following based off CNs referral or appointments  made? N/A    ED Visit Averted N/A    Life-Saving Intervention Made N/A

## 2022-04-10 ENCOUNTER — Ambulatory Visit: Payer: Medicaid Other | Attending: Family Medicine | Admitting: Family Medicine

## 2022-04-10 ENCOUNTER — Encounter: Payer: Self-pay | Admitting: Family Medicine

## 2022-04-10 ENCOUNTER — Other Ambulatory Visit: Payer: Self-pay

## 2022-04-10 ENCOUNTER — Telehealth: Payer: Self-pay

## 2022-04-10 VITALS — BP 127/76 | HR 90 | Ht 68.0 in | Wt 165.0 lb

## 2022-04-10 DIAGNOSIS — E1165 Type 2 diabetes mellitus with hyperglycemia: Secondary | ICD-10-CM | POA: Diagnosis not present

## 2022-04-10 DIAGNOSIS — N200 Calculus of kidney: Secondary | ICD-10-CM | POA: Diagnosis not present

## 2022-04-10 DIAGNOSIS — Z833 Family history of diabetes mellitus: Secondary | ICD-10-CM | POA: Diagnosis not present

## 2022-04-10 DIAGNOSIS — F332 Major depressive disorder, recurrent severe without psychotic features: Secondary | ICD-10-CM | POA: Diagnosis not present

## 2022-04-10 DIAGNOSIS — Z7984 Long term (current) use of oral hypoglycemic drugs: Secondary | ICD-10-CM | POA: Diagnosis not present

## 2022-04-10 DIAGNOSIS — Z794 Long term (current) use of insulin: Secondary | ICD-10-CM | POA: Diagnosis not present

## 2022-04-10 DIAGNOSIS — G8929 Other chronic pain: Secondary | ICD-10-CM | POA: Diagnosis not present

## 2022-04-10 DIAGNOSIS — E1149 Type 2 diabetes mellitus with other diabetic neurological complication: Secondary | ICD-10-CM | POA: Diagnosis not present

## 2022-04-10 DIAGNOSIS — I739 Peripheral vascular disease, unspecified: Secondary | ICD-10-CM

## 2022-04-10 DIAGNOSIS — I10 Essential (primary) hypertension: Secondary | ICD-10-CM | POA: Diagnosis not present

## 2022-04-10 DIAGNOSIS — N3941 Urge incontinence: Secondary | ICD-10-CM | POA: Diagnosis not present

## 2022-04-10 DIAGNOSIS — N39498 Other specified urinary incontinence: Secondary | ICD-10-CM

## 2022-04-10 DIAGNOSIS — M25511 Pain in right shoulder: Secondary | ICD-10-CM

## 2022-04-10 DIAGNOSIS — Z79899 Other long term (current) drug therapy: Secondary | ICD-10-CM | POA: Insufficient documentation

## 2022-04-10 DIAGNOSIS — M5126 Other intervertebral disc displacement, lumbar region: Secondary | ICD-10-CM | POA: Diagnosis not present

## 2022-04-10 DIAGNOSIS — F333 Major depressive disorder, recurrent, severe with psychotic symptoms: Secondary | ICD-10-CM

## 2022-04-10 DIAGNOSIS — Z5901 Sheltered homelessness: Secondary | ICD-10-CM | POA: Diagnosis not present

## 2022-04-10 DIAGNOSIS — E1151 Type 2 diabetes mellitus with diabetic peripheral angiopathy without gangrene: Secondary | ICD-10-CM | POA: Insufficient documentation

## 2022-04-10 DIAGNOSIS — F17210 Nicotine dependence, cigarettes, uncomplicated: Secondary | ICD-10-CM | POA: Insufficient documentation

## 2022-04-10 DIAGNOSIS — E1140 Type 2 diabetes mellitus with diabetic neuropathy, unspecified: Secondary | ICD-10-CM | POA: Diagnosis not present

## 2022-04-10 LAB — POCT ABI - SCREENING FOR PILOT NO CHARGE

## 2022-04-10 MED ORDER — AMLODIPINE BESYLATE 10 MG PO TABS
10.0000 mg | ORAL_TABLET | Freq: Every day | ORAL | 1 refills | Status: DC
  Filled 2022-04-10: qty 90, 90d supply, fill #0

## 2022-04-10 MED ORDER — METFORMIN HCL 1000 MG PO TABS
1000.0000 mg | ORAL_TABLET | Freq: Two times a day (BID) | ORAL | 1 refills | Status: DC
  Filled 2022-04-10: qty 180, 90d supply, fill #0

## 2022-04-10 MED ORDER — BUPROPION HCL ER (XL) 150 MG PO TB24
150.0000 mg | ORAL_TABLET | Freq: Every day | ORAL | 1 refills | Status: DC
  Filled 2022-04-10: qty 90, 90d supply, fill #0

## 2022-04-10 MED ORDER — INSULIN LISPRO (1 UNIT DIAL) 100 UNIT/ML (KWIKPEN)
2.0000 [IU] | PEN_INJECTOR | Freq: Two times a day (BID) | SUBCUTANEOUS | 6 refills | Status: DC
  Filled 2022-04-10: qty 15, 140d supply, fill #0

## 2022-04-10 MED ORDER — ATORVASTATIN CALCIUM 40 MG PO TABS
40.0000 mg | ORAL_TABLET | Freq: Every day | ORAL | 1 refills | Status: DC
  Filled 2022-04-10: qty 90, 90d supply, fill #0

## 2022-04-10 MED ORDER — FAMOTIDINE 10 MG PO TABS
10.0000 mg | ORAL_TABLET | Freq: Two times a day (BID) | ORAL | 1 refills | Status: DC
  Filled 2022-04-10: qty 180, 90d supply, fill #0

## 2022-04-10 MED ORDER — CYCLOBENZAPRINE HCL 10 MG PO TABS
10.0000 mg | ORAL_TABLET | Freq: Three times a day (TID) | ORAL | 1 refills | Status: DC | PRN
  Filled 2022-04-10: qty 60, 20d supply, fill #0

## 2022-04-10 MED ORDER — GLIPIZIDE ER 10 MG PO TB24
10.0000 mg | ORAL_TABLET | Freq: Every day | ORAL | 1 refills | Status: DC
  Filled 2022-04-10: qty 90, 90d supply, fill #0

## 2022-04-10 MED ORDER — PREDNISONE 20 MG PO TABS
20.0000 mg | ORAL_TABLET | Freq: Every day | ORAL | 0 refills | Status: DC
  Filled 2022-04-10: qty 5, 5d supply, fill #0

## 2022-04-10 MED ORDER — INSULIN PEN NEEDLE 31G X 5 MM MISC
1.0000 | Freq: Every day | 5 refills | Status: DC
  Filled 2022-04-10: qty 100, 25d supply, fill #0

## 2022-04-10 MED ORDER — DULOXETINE HCL 60 MG PO CPEP
60.0000 mg | ORAL_CAPSULE | Freq: Every day | ORAL | 1 refills | Status: DC
  Filled 2022-04-10: qty 90, 90d supply, fill #0

## 2022-04-10 MED ORDER — TAMSULOSIN HCL 0.4 MG PO CAPS
0.4000 mg | ORAL_CAPSULE | Freq: Every day | ORAL | 1 refills | Status: DC
  Filled 2022-04-10: qty 90, 90d supply, fill #0

## 2022-04-10 MED ORDER — GABAPENTIN 300 MG PO CAPS
600.0000 mg | ORAL_CAPSULE | Freq: Three times a day (TID) | ORAL | 1 refills | Status: DC
  Filled 2022-04-10: qty 540, 90d supply, fill #0

## 2022-04-10 MED ORDER — TRAZODONE HCL 100 MG PO TABS
100.0000 mg | ORAL_TABLET | Freq: Every evening | ORAL | 1 refills | Status: DC | PRN
  Filled 2022-04-10: qty 90, 90d supply, fill #0

## 2022-04-10 MED ORDER — INSULIN GLARGINE SOLOSTAR 100 UNIT/ML ~~LOC~~ SOPN
30.0000 [IU] | PEN_INJECTOR | Freq: Every day | SUBCUTANEOUS | 6 refills | Status: DC
  Filled 2022-04-10: qty 30, 100d supply, fill #0

## 2022-04-10 NOTE — Progress Notes (Signed)
Cab transportation was arranged by David Wyatt, [place]

## 2022-04-10 NOTE — Progress Notes (Signed)
Pain in right shoulder.

## 2022-04-10 NOTE — Telephone Encounter (Signed)
Addressed.

## 2022-04-10 NOTE — Telephone Encounter (Signed)
Evelena Asa, RN/Triage Nurse and I met with the patient when he was in the clinic today for his appointment with PCP.  Dr Margarita Rana asked that we schedule him for an appointment with [facility].  He has been referred in the past and has multiple no shows/cancellations.  Cassandra made an appointment for him on 04/30/2022 at 1300 and was informed that the patient cannot miss this appointment,and if he does, they will not schedule him again.   Dr Margarita Rana has also placed a referral for vascular and that appointment will need to be scheduled.  The information about these appointments was shared with the patient and he said he will not miss those appointments. He will need transportation arranged.   [place], are you able to assist with scheduling his rides?  He also spoke about his desire for independent housing and I sent an email to [employer] inquiring about the status of this search for housing.  Do you have any information about housing for him?

## 2022-04-10 NOTE — Patient Instructions (Addendum)
04/30/2021 at 1:00 pm Dr [facility]     Peripheral Vascular Disease  Peripheral vascular disease (PVD) is a disease of the blood vessels. PVD may also be called peripheral artery disease (PAD) or poor circulation. PVD is the blocking or hardening of the arteries anywhere within the circulatory system beyond the heart. This can result in a decreased supply of blood to the arms, legs, and internal organs, such as the stomach or kidneys. However, PVD most often affects a person's lower legs and feet. Without treatment, PVD often worsens. PVD can lead to acute limb ischemia. This occurs when an arm or leg suddenly has trouble getting enough blood. This is a medical emergency. What are the causes? The most common cause of PVD is atherosclerosis. This is a buildup of fatty material and other substances (plaque)inside your arteries. Pieces of plaque can break off from the walls of an artery and become stuck in a smaller artery, blocking blood flow and possibly causing acute limb ischemia. Other common causes of PVD include: Blood clots that form inside the blood vessels. Injuries to blood vessels. Diseases that cause inflammation of blood vessels or cause blood vessel tightening (spasms). What increases the risk? The following factors may make you more likely to develop this condition: A family history of PVD. Common medical conditions, including: High cholesterol. Diabetes. High blood pressure (hypertension). Heart disease. Known atherosclerotic disease in another area of the body. Past injury, such as burns or a broken bone. Other medical conditions, such as: Buerger's disease. This is caused by inflamed blood vessels in your hands and feet. Some forms of arthritis. Birth defects that affect the arteries in your legs. Kidney disease. Using tobacco and nicotine products. Not getting enough exercise. Obesity. Being age 4 or older, or being age 45 or older and having the other risk  factors. What are the signs or symptoms? This condition may cause different symptoms. Your symptoms depend on what body part is not getting enough blood. Common signs and symptoms include: Cramps in your buttocks, legs, and feet. Intermittent claudication. This is pain and weakness in your legs during activity that resolves with rest. Leg pain at rest and leg numbness, tingling, or weakness. Coldness in a leg or foot, especially when compared to the other leg or foot. Skin or hair changes. These can include: Hair loss. Shiny skin. Pale or bluish skin. Thick toenails. Inability to get or maintain an erection (erectile dysfunction). Tiredness (fatigue). Weak pulse or no pulse in the feet. People with PVD are more likely to develop open wounds (ulcers) and sores on their toes, feet, or legs. The ulcers or sores may take longer than normal to heal. How is this diagnosed? PVD is diagnosed based on your signs and symptoms, a physical exam, and your medical history. You may also have other tests to find the cause. Tests include: Ankle-brachial index test.This test compares the blood pressure readings of the legs and arms. This may also include an exercise ankle-brachial index test in which you walk on a treadmill to check your symptoms. Doppler ultrasound. This takes pictures of blood flow through your blood vessels. Imaging studies that use dye to show blood flow. These are: CT angiogram. Magnetic resonance angiogram, or MRA. How is this treated? Treatment for PVD depends on the cause of your condition, how severe your symptoms are, and your age. Underlying causes need to be treated and controlled. These include long-term (chronic) conditions, such as diabetes, high cholesterol, and hypertension. Treatment may include:  Lifestyle changes, such as: Quitting tobacco use. Exercising regularly. Following a low-fat, low-cholesterol diet. Not drinking alcohol. Taking medicines, such as: Blood  thinners to prevent blood clots. Medicines to improve blood flow. Medicines to improve cholesterol levels. Procedures, such as: Angioplasty. This uses an inflated balloon to open a blocked artery and improve blood flow. Stent implant. This inserts a small mesh tube to keep a blocked artery open. Peripheral bypass surgery. This reroutes blood flow around a blocked artery. Surgery to remove dead tissue from an infected wound (debridement). Amputation. This is surgical removal of the affected limb. It may be necessary in cases of acute limb ischemia when medical or surgical treatments have not helped. Follow these instructions at home: Medicines Take over-the-counter and prescription medicines only as told by your health care provider. If you are taking blood thinners: Talk with your health care provider before you take any medicines that contain aspirin or NSAIDs, such as ibuprofen. These medicines increase your risk for dangerous bleeding. Take your medicine exactly as told, at the same time every day. Avoid activities that could cause injury or bruising, and follow instructions about how to prevent falls. Wear a medical alert bracelet or carry a card that lists what medicines you take. Lifestyle     Exercise regularly. Ask your health care provider about some good activities for you. Talk with your health care provider about maintaining a healthy weight. If needed, ask about losing weight. Eat a diet that is low in fat and cholesterol. If you need help, talk with your health care provider. Do not drink alcohol. Do not use any products that contain nicotine or tobacco. These products include cigarettes, chewing tobacco, and vaping devices, such as e-cigarettes. If you need help quitting, ask your health care provider. General instructions Take good care of your feet. To do this: Wear comfortable shoes that fit well. Check your feet often for any cuts or sores. Get an annual influenza  vaccine. Keep all follow-up visits. This is important. Where to find more information Society for Vascular Surgery: vascular.org American Heart Association: heart.org National Heart, Lung, and Blood Institute: https://www.hartman-hill.biz/ Contact a health care provider if: You have leg cramps while walking. You have leg pain when you rest. Your leg or foot feels cold. Your skin changes color. You have erectile dysfunction. You have cuts or sores on your legs or feet that do not heal. Get help right away if: You have sudden changes in color and feeling of your arms or legs, such as: Your arm or leg turns cold, numb, and blue. Your arm or leg becomes red, warm, swollen, painful, or numb. You have any symptoms of a stroke. "BE FAST" is an easy way to remember the main warning signs of a stroke: B - Balance. Signs are dizziness, sudden trouble walking, or loss of balance. E - Eyes. Signs are trouble seeing or a sudden change in vision. F - Face. Signs are sudden weakness or numbness of the face, or the face or eyelid drooping on one side. A - Arms. Signs are weakness or numbness in an arm. This happens suddenly and usually on one side of the body. S - Speech. Signs are sudden trouble speaking, slurred speech, or trouble understanding what people say. T - Time. Time to call emergency services. Write down what time symptoms started. You have other signs of a stroke, such as: A sudden, severe headache with no known cause. Nausea or vomiting. Seizure. You have chest pain or trouble breathing.  These symptoms may represent a serious problem that is an emergency. Do not wait to see if the symptoms will go away. Get medical help right away. Call your local emergency services (911 in the [place] Do not drive yourself to the hospital. Summary Peripheral vascular disease (PVD) is a disease of the blood vessels. PVD is the blocking or hardening of the arteries anywhere within the circulatory system beyond the  heart. PVD may cause different symptoms. Your symptoms depend on what part of your body is not getting enough blood. Treatment for PVD depends on what caused it, how severe your symptoms are, and your age. This information is not intended to replace advice given to you by your health care provider. Make sure you discuss any questions you have with your health care provider. Document Revised: 08/30/2019 Document Reviewed: 08/30/2019 Elsevier Patient Education  [place].

## 2022-04-10 NOTE — Progress Notes (Signed)
He stated he has been working with Neoma Laming, caseworker/ [facility] where he is currently staying.

## 2022-04-10 NOTE — Progress Notes (Signed)
Subjective:  Patient ID: [name], male    DOB: 1960/04/21  Age: 62 y.o. MRN: [id number]  CC: Shoulder Pain   HPI Damarius Karnes is a 62 y.o. year old male with a history of type 2 diabetes mellitus (A1c 9.3),  Homelessness, diabetic neuropathy, hypertension, chronic low back pain (status post previous back surgery), right shoulder osteoarthritis, renal calculi medication nonadherence. He resides at [facility].   Interval History:  Today he complains of chronic right shoulder pain-due to range of motion and pain prevents him from sleeping. He cannot undergo right rotator ucff surgery until he has a place on his own. His right shoulder locks up sometimes.  He Complains of urinating on himself, he has had a lot of urgency.  He has been referred to urology on multiple occasions but has never followed through.  Previously had hematuria which he longer has. CT renal stone study from 08/2021 revealed: IMPRESSION: 1. Nonobstructive calculi are noted in the collecting systems of both kidneys measuring up to 4 mm in the lower pole collecting system of the right kidney. 2. No other acute findings are noted to account for the patient's symptoms. 3. Right-sided double-J ureteral stent appears appropriately located. Persistent mild fullness in the right renal pelvis is unchanged. 4. Aortic atherosclerosis. 5. Additional incidental findings, as above.     Endorses adherence with his insulin but states he needs a prescription for pen needles .He has not experienced any hypoglycemic episodes. He said he was suicidal so he would be admitted into rehab as he states he was tired of being in the cold. Past Medical History:  Diagnosis Date   Angioedema of lips 07/28/2012   left upper (07/29/2012)   Arthritis    hands and back   Chronic back pain    Cocaine abuse (HCC)    Diabetic neuropathy (HCC)    High cholesterol    Hypertension    Neuropathy    Type II diabetes mellitus ([place])      Past Surgical History:  Procedure Laterality Date   BACK SURGERY     CYSTOSCOPY W/ URETERAL STENT PLACEMENT Right 02/23/2021   Procedure: CYSTOSCOPY WITH RETROGRADE PYELOGRAM/URETERAL STENT PLACEMENT;  Surgeon: Janith Lima, MD;  Location: [facility];  Service: Urology;  Laterality: Right;   HERNIA REPAIR Right 04/01/2012   I & D EXTREMITY Left 12/13/2012   Procedure: IRRIGATION AND DEBRIDEMENT LEFT THUMB;  Surgeon: Tennis Must, MD;  Location: [facility];  Service: Orthopedics;  Laterality: Left;   INCISION AND DRAINAGE OF WOUND     boil on back/notes 07/14/2008 (07/29/2012)   INGUINAL HERNIA REPAIR  04/01/2012   Procedure: HERNIA REPAIR INGUINAL ADULT;  Surgeon: Haywood Lasso, MD;  Location: [facility];  Service: General;  Laterality: Right;   INGUINAL HERNIA REPAIR Left 09/06/2016   Procedure: OPEN REPAIR LEFT INGUINAL HERNIA;  Surgeon: Greer Pickerel, MD;  Location: [facility];  Service: General;  Laterality: Left;   INSERTION OF MESH Left 09/06/2016   Procedure: INSERTION OF MESH;  Surgeon: Greer Pickerel, MD;  Location: [facility];  Service: General;  Laterality: Left;   TONSILLECTOMY      Family History  Problem Relation Age of Onset   Diabetes Mother    Kidney disease Mother    Hyperlipidemia Mother    Hyperlipidemia Father    Diabetes Father     Social History   Socioeconomic History   Marital status: Divorced    Spouse name: Not on file   Number  of children: 3   Years of education: 12   Highest education level: Not on file  Occupational History    Comment: disabled  Tobacco Use   Smoking status: Every Day    Packs/day: 0.25    Years: 30.00    Total pack years: 7.50    Types: Cigarettes   Smokeless tobacco: Former    Quit date: 08/15/2015   Tobacco comments:    04/29/16 2  cigs daily, 10/27/17 sometimes < .25 PPD  Vaping Use   Vaping Use: Never used  Substance and Sexual Activity   Alcohol use: No    Alcohol/week: 0.0 standard drinks of alcohol   Drug use: Not Currently    Types:  "Crack" cocaine   Sexual activity: Not on file  Other Topics Concern   Not on file  Social History Narrative   04/29/17   Lives in shelter   Caffeine- a lot of  tea, coffee   Social Determinants of Health   Financial Resource Strain: Not on file  Food Insecurity: Not on file  Transportation Needs: Not on file  Physical Activity: Not on file  Stress: Not on file  Social Connections: Not on file    Allergies  Allergen Reactions   Lisinopril Other (See Comments) and Swelling    Lip Swelling, angioedema  angioedema  Lip Swelling  Lip Swelling, angioedema    Outpatient Medications Prior to Visit  Medication Sig Dispense Refill   Blood Glucose Monitoring Suppl (ACCU-CHEK GUIDE) w/Device KIT USE AS DIRECTED TO CHECK BLOOD SUGARS UP TO TWICE PER DAY 1 kit 0   Insulin Syringe-Needle U-100 (TRUEPLUS INSULIN SYRINGE) 31G X 5/16" 0.5 ML MISC 1-30 Units in the morning, at noon, in the evening, and at bedtime. 100 each 0   Insulin Glargine Solostar (LANTUS) 100 UNIT/ML Solostar Pen Inject 30 Units into the skin daily. 15 mL 0   insulin lispro (HUMALOG KWIKPEN) 100 UNIT/ML KwikPen Inject 2 Units into the skin 2 (two) times daily before lunch and supper. 15 mL 0   amLODipine (NORVASC) 10 MG tablet Take 1 tablet (10 mg total) by mouth daily. 30 tablet 0   atorvastatin (LIPITOR) 40 MG tablet Take 1 tablet (40 mg total) by mouth daily. 30 tablet 0   DULoxetine (CYMBALTA) 60 MG capsule Take 1 capsule (60 mg total) by mouth daily. 30 capsule 0   famotidine (PEPCID) 10 MG tablet Take 1 tablet (10 mg total) by mouth 2 (two) times daily. 60 tablet 0   gabapentin (NEURONTIN) 300 MG capsule Take 2 capsules (600 mg total) by mouth 3 (three) times daily. 180 capsule 0   glipiZIDE (GLUCOTROL XL) 10 MG 24 hr tablet Take 1 tablet (10 mg total) by mouth daily with breakfast. 30 tablet 0   metFORMIN (GLUCOPHAGE) 1000 MG tablet Take 1 tablet (1,000 mg total) by mouth 2 (two) times daily. 60 tablet 0    traZODone (DESYREL) 100 MG tablet Take 1 tablet (100 mg total) by mouth at bedtime as needed for sleep. 30 tablet 0   No facility-administered medications prior to visit.     ROS Review of Systems  Constitutional:  Negative for activity change and appetite change.  HENT:  Negative for sinus pressure and sore throat.   Respiratory:  Negative for chest tightness, shortness of breath and wheezing.   Cardiovascular:  Negative for chest pain and palpitations.  Gastrointestinal:  Negative for abdominal distention, abdominal pain and constipation.  Genitourinary: Negative.   Musculoskeletal: Negative.  See HPI  Psychiatric/Behavioral:  Negative for behavioral problems and dysphoric mood.     Objective:  BP 127/76   Pulse 90   Ht '5\' 8"'$  (1.727 m)   Wt 165 lb (74.8 kg)   SpO2 96%   BMI 25.09 kg/m      04/10/2022   11:03 AM 04/09/2022    9:25 AM 03/28/2022   10:30 AM  BP/Weight  Systolic BP 947 096 283  Diastolic BP 76 82 662  Wt. (Lbs) 165    BMI 25.09 kg/m2        Physical Exam Constitutional:      Appearance: He is well-developed.  Cardiovascular:     Rate and Rhythm: Normal rate.     Heart sounds: Normal heart sounds. No murmur heard. Pulmonary:     Effort: Pulmonary effort is normal.     Breath sounds: Normal breath sounds. No wheezing or rales.  Chest:     Chest wall: No tenderness.  Abdominal:     General: Bowel sounds are normal. There is no distension.     Palpations: Abdomen is soft. There is no mass.     Tenderness: There is no abdominal tenderness.  Musculoskeletal:        General: Normal range of motion.     Right lower leg: No edema.     Left lower leg: No edema.     Comments: Active abduction of right shoulder restricted to 60 degrees Tenderness on palpation of right shoulder joint  Neurological:     Mental Status: He is alert and oriented to person, place, and time.  Psychiatric:        Mood and Affect: Mood normal.        Latest Ref Rng  & Units 03/07/2022    6:22 AM 03/02/2022    4:08 PM 01/20/2022    2:32 PM  CMP  Glucose 70 - 99 mg/dL 148  709  360   BUN 8 - 23 mg/dL '19  16  12   '$ Creatinine 0.61 - 1.24 mg/dL 1.13  1.36  1.01   Sodium 135 - 145 mmol/L 142  130  136   Potassium 3.5 - 5.1 mmol/L 4.3  4.4  4.0   Chloride 98 - 111 mmol/L 107  96  103   CO2 22 - 32 mmol/L '26  24  24   '$ Calcium 8.9 - 10.3 mg/dL 9.2  8.7  9.4   Total Protein 6.5 - 8.1 g/dL 6.6  6.5  7.3   Total Bilirubin 0.3 - 1.2 mg/dL 0.4  0.2  0.3   Alkaline Phos 38 - 126 U/L 79  92  101   AST 15 - 41 U/L 16  35  14   ALT 0 - 44 U/L '18  22  10     '$ Lipid Panel     Component Value Date/Time   CHOL 213 (H) 05/23/2021 0954   TRIG 115 05/23/2021 0954   HDL 71 05/23/2021 0954   CHOLHDL 3.2 11/03/2019 1003   CHOLHDL 3.6 05/22/2016 0852   VLDL 32 (H) 05/03/2015 0954   LDLCALC 122 (H) 05/23/2021 0954    CBC    Component Value Date/Time   WBC 6.5 03/02/2022 1608   RBC 4.21 (L) 03/02/2022 1608   HGB 12.8 (L) 03/02/2022 1608   HGB 14.3 11/03/2019 1003   HCT 40.2 03/02/2022 1608   HCT 43.4 11/03/2019 1003   PLT 372 03/02/2022 1608   PLT 295 11/03/2019 1003  MCV 95.5 03/02/2022 1608   MCV 94 11/03/2019 1003   MCH 30.4 03/02/2022 1608   MCHC 31.8 03/02/2022 1608   RDW 14.2 03/02/2022 1608   RDW 12.4 11/03/2019 1003   LYMPHSABS 1.7 03/02/2022 1608   LYMPHSABS 1.9 11/03/2019 1003   MONOABS 0.5 03/02/2022 1608   EOSABS 0.2 03/02/2022 1608   EOSABS 0.3 11/03/2019 1003   BASOSABS 0.0 03/02/2022 1608   BASOSABS 0.1 11/03/2019 1003    Lab Results  Component Value Date   HGBA1C 10.4 (H) 01/21/2022    Assessment & Plan:  1. Uncontrolled type 2 diabetes mellitus with hyperglycemia ([place]) Not fully optimized with A1c of 10.4 from 01/2022 He has since become more adherent with his medications Random blood glucose is 156 this morning hence I will make no regimen changes. Counseled on Diabetic diet, my plate method, 268 minutes of moderate  intensity exercise/week Blood sugar logs with fasting goals of 80-120 mg/dl, random of less than 180 and in the event of sugars less than 60 mg/dl or greater than 400 mg/dl encouraged to notify the clinic. Advised on the need for annual eye exams, annual foot exams, Pneumonia vaccine. - Insulin Pen Needle 31G X 5 MM MISC; Use with insulin  Dispense: 100 each; Refill: 5 - atorvastatin (LIPITOR) 40 MG tablet; Take 1 tablet (40 mg total) by mouth daily.  Dispense: 90 tablet; Refill: 1 - glipiZIDE (GLUCOTROL XL) 10 MG 24 hr tablet; Take 1 tablet (10 mg total) by mouth daily with breakfast.  Dispense: 90 tablet; Refill: 1 - Insulin Glargine Solostar (LANTUS) 100 UNIT/ML Solostar Pen; Inject 30 Units into the skin daily.  Dispense: 30 mL; Refill: 6 - insulin lispro (HUMALOG KWIKPEN) 100 UNIT/ML KwikPen; Inject 2 Units into the skin 2 (two) times daily before lunch and supper.  Dispense: 15 mL; Refill: 6 - metFORMIN (GLUCOPHAGE) 1000 MG tablet; Take 1 tablet (1,000 mg total) by mouth 2 (two) times daily.  Dispense: 180 tablet; Refill: 1  2. Essential hypertension, benign Controlled Continue current regimen Counseled on blood pressure goal of less than 130/80, low-sodium, DASH diet, medication compliance, 150 minutes of moderate intensity exercise per week. Discussed medication compliance, adverse effects. - amLODipine (NORVASC) 10 MG tablet; Take 1 tablet (10 mg total) by mouth daily.  Dispense: 90 tablet; Refill: 1  3. Lumbar disc herniation Stable - gabapentin (NEURONTIN) 300 MG capsule; Take 2 capsules (600 mg total) by mouth 3 (three) times daily.  Dispense: 540 capsule; Refill: 1  4. Other diabetic neurological complication associated with type 2 diabetes mellitus (HCC) Stable - gabapentin (NEURONTIN) 300 MG capsule; Take 2 capsules (600 mg total) by mouth 3 (three) times daily.  Dispense: 540 capsule; Refill: 1  5. Renal calculi He does have bilateral ureteral stents Has been referred to  urology on several occasion but never followed through I am placing him on Flomax due to lower urinary tract symptoms I have had the case manager meet with him to assist in scheduling transportation so he can make his urology appointment  6. Other urinary incontinence Tamsulosin initiated See #5 above - tamsulosin (FLOMAX) 0.4 MG CAPS capsule; Take 1 capsule (0.4 mg total) by mouth daily.  Dispense: 90 capsule; Refill: 1 - PSA, total and free  7. Living in homeless shelter Case manager met with him to discuss resources available  8. Chronic right shoulder pain Uncontrolled Unfortunately he needs right shoulder surgery and could not have this due to his homelessness - predniSONE (DELTASONE) 20 MG tablet;  Take 1 tablet (20 mg total) by mouth daily with breakfast.  Dispense: 5 tablet; Refill: 0 - cyclobenzaprine (FLEXERIL) 10 MG tablet; Take 1 tablet (10 mg total) by mouth 3 (three) times daily as needed for muscle spasms.  Dispense: 60 tablet; Refill: 1  9. Peripheral arterial disease ([place]) Noticed on in office ABI Strongly encouraged to quit smoking to prevent progression - Ambulatory referral to Vascular Surgery - POCT ABI Screening for [facility] No Charge  10. Cigarette nicotine dependence without complication Spent 3 minutes counseling about smoking cessation and need to quit and he is interested in working on quitting Initiated Wellbutrin - buPROPion (WELLBUTRIN XL) 150 MG 24 hr tablet; Take 1 tablet (150 mg total) by mouth daily.  Dispense: 90 tablet; Refill: 1  11. 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  Dispense: 90 capsule; Refill: 1    Meds ordered this encounter  Medications   tamsulosin (FLOMAX) 0.4 MG CAPS capsule    Sig: Take 1 capsule (0.4 mg total) by mouth daily.    Dispense:  90 capsule    Refill:  1   predniSONE  (DELTASONE) 20 MG tablet    Sig: Take 1 tablet (20 mg total) by mouth daily with breakfast.    Dispense:  5 tablet    Refill:  0   cyclobenzaprine (FLEXERIL) 10 MG tablet    Sig: Take 1 tablet (10 mg total) by mouth 3 (three) times daily as needed for muscle spasms.    Dispense:  60 tablet    Refill:  1   Insulin Pen Needle 31G X 5 MM MISC    Sig: Use with insulin    Dispense:  100 each    Refill:  5   famotidine (PEPCID) 10 MG tablet    Sig: Take 1 tablet (10 mg total) by mouth 2 (two) times daily.    Dispense:  180 tablet    Refill:  1   amLODipine (NORVASC) 10 MG tablet    Sig: Take 1 tablet (10 mg total) by mouth daily.    Dispense:  90 tablet    Refill:  1   atorvastatin (LIPITOR) 40 MG tablet    Sig: Take 1 tablet (40 mg total) by mouth daily.    Dispense:  90 tablet    Refill:  1   DULoxetine (CYMBALTA) 60 MG capsule    Sig: Take 1 capsule (60 mg total) by mouth daily.    Dispense:  90 capsule    Refill:  1   gabapentin (NEURONTIN) 300 MG capsule    Sig: Take 2 capsules (600 mg total) by mouth 3 (three) times daily.    Dispense:  540 capsule    Refill:  1    Discontinue previous dose   glipiZIDE (GLUCOTROL XL) 10 MG 24 hr tablet    Sig: Take 1 tablet (10 mg total) by mouth daily with breakfast.    Dispense:  90 tablet    Refill:  1   Insulin Glargine Solostar (LANTUS) 100 UNIT/ML Solostar Pen    Sig: Inject 30 Units into the skin daily.    Dispense:  30 mL    Refill:  6   insulin lispro (HUMALOG KWIKPEN) 100 UNIT/ML KwikPen    Sig: Inject 2 Units into the skin 2 (two) times daily before lunch and supper.    Dispense:  15 mL  Refill:  6   metFORMIN (GLUCOPHAGE) 1000 MG tablet    Sig: Take 1 tablet (1,000 mg total) by mouth 2 (two) times daily.    Dispense:  180 tablet    Refill:  1   traZODone (DESYREL) 100 MG tablet    Sig: Take 1 tablet (100 mg total) by mouth at bedtime as needed for sleep.    Dispense:  90 tablet    Refill:  1   buPROPion (WELLBUTRIN  XL) 150 MG 24 hr tablet    Sig: Take 1 tablet (150 mg total) by mouth daily.    Dispense:  90 tablet    Refill:  1    For smoking cessation    Visit required 51 minutes of patient care including median intraservice time, reviewing previous notes and test results, coordination of care, counseling the patient on new diagnosis of PAD, need for smoking cessation in addition to management of chronic medical conditions.Time also spent ordering medications, investigations and documenting in the chart.  All questions were answered to the patient's satisfaction  Follow-up: Return in about 3 months (around 07/09/2022).       [name], MD, FAAFP. [facility] [place], [place]   04/10/2022, 2:26 PM

## 2022-04-10 NOTE — Progress Notes (Signed)
Patient seen by Case worker and Triage Nurse. Informed patient that appointment has been obtained for urology for April 30, 2022 at 1:00 pm and  we are going to contact Benard Halsted, Media planner at  [facility]  to help arrange transportion for that visit.  Reinforced with patient that if he misses this appointment he will not be able to reschedule the appointment due to the number of missed appointments. Patient voiced understanding. Patient also informed that PCP has sent a referral for vascular and this information will also be shared with  the congregational nurse so she may assist him with transportation.

## 2022-04-11 ENCOUNTER — Other Ambulatory Visit: Payer: Self-pay

## 2022-04-11 ENCOUNTER — Other Ambulatory Visit: Payer: Self-pay | Admitting: Critical Care Medicine

## 2022-04-11 ENCOUNTER — Telehealth: Payer: Self-pay

## 2022-04-11 DIAGNOSIS — E1165 Type 2 diabetes mellitus with hyperglycemia: Secondary | ICD-10-CM

## 2022-04-11 LAB — PSA, TOTAL AND FREE
PSA, Free Pct: 25 %
PSA, Free: 0.2 ng/mL
Prostate Specific Ag, Serum: 0.8 ng/mL (ref 0.0–4.0)

## 2022-04-11 MED ORDER — INSULIN PEN NEEDLE 31G X 5 MM MISC
1.0000 | Freq: Every day | 5 refills | Status: DC
  Filled 2022-04-11: qty 100, 25d supply, fill #0

## 2022-04-11 NOTE — Telephone Encounter (Signed)
Patient returned our call. Shared provider's note.  [name], MD 04/11/2022  1:09 PM EST     Please inform him that his prostate lab is normal.  Thank you   Helped pt with [employer]

## 2022-04-16 ENCOUNTER — Encounter: Payer: Medicaid Other | Admitting: Family Medicine

## 2022-04-23 NOTE — Congregational Nurse Program (Signed)
  Dept: [phone]   Congregational Nurse Program Note  Date of Encounter: 04/16/2022  Arranged transportation for his visit on 04/30/22 for the urology clinic and left message for [facility] supervisor to notify resident that transportation arrangement completed. Past Medical History: Past Medical History:  Diagnosis Date   Angioedema of lips 07/28/2012   left upper (07/29/2012)   Arthritis    hands and back   Chronic back pain    Cocaine abuse (HCC)    Diabetic neuropathy (HCC)    High cholesterol    Hypertension    Neuropathy    Type II diabetes mellitus ([place])     Encounter Details:  CNP Questionnaire - 04/16/22 1630       Questionnaire   Ask client: Do you give verbal consent for me to treat you today? Yes    Student Assistance [employer] Nurse    Location Patient [facility]    Visit Setting with Client Organization    Patient Status Unhoused    Insurance Medicaid    Insurance/Financial Assistance Referral N/A    Medication Have Medication Insecurities    Medical Provider Yes    Screening Referrals Made N/A    Medical Referrals Made N/A    Medical Appointment Made N/A    Recently w/o PCP, now 1st time PCP visit completed due to CNs referral or appointment made N/A    Food Have Food Insecurities    Transportation Provided transportation assistance;Need transportation assistance    Housing/Utilities No permanent housing    Interpersonal Safety Do not feel safe at current residence    Interventions Advocate/Support;Counsel    Abnormal to Normal Screening Since Last CN Visit N/A    Screenings CN Performed N/A    Sent Client to Lab for: N/A    Did client attend any of the following based off CNs referral or appointments made? N/A    ED Visit Averted N/A    Life-Saving Intervention Made N/A

## 2022-04-24 ENCOUNTER — Ambulatory Visit (HOSPITAL_COMMUNITY): Payer: Medicaid Other | Attending: Vascular Surgery

## 2022-04-24 ENCOUNTER — Other Ambulatory Visit: Payer: Self-pay | Admitting: *Deleted

## 2022-04-24 ENCOUNTER — Ambulatory Visit: Payer: Self-pay | Admitting: *Deleted

## 2022-04-24 ENCOUNTER — Encounter: Payer: Medicaid Other | Admitting: Vascular Surgery

## 2022-04-24 DIAGNOSIS — I739 Peripheral vascular disease, unspecified: Secondary | ICD-10-CM

## 2022-04-24 NOTE — Telephone Encounter (Signed)
  Chief Complaint: Medication Management Symptoms: Pt states "I don't know how to take my medications."  States last took yesterday, "Not sure I'm right." States "I'm confused." Oriented x 4, person, place, time, current events. States "I don't think I should be living by myself." States "I'm scared." Frequency:  Pertinent Negatives: Patient denies suicidal , homicidal ideation Disposition: '[]'$ ED /'[]'$ Urgent Care (no appt availability in office) / '[]'$ Appointment(In office/virtual)/ '[]'$  [facility]/ '[]'$ Home Care/ '[]'$ Refused Recommended Disposition /'[]'$ [place] [facility]/ '[x]'$  Follow-up with PCP Additional Notes: Pt states alone, offered wellness check, declines. States doesn't want to live alone. Evasive historian. Offered appt, declines. States sister listed on demographics does not check on  him.  Please advise. Reason for Disposition  [1] Caller has URGENT medicine question about med that PCP or specialist prescribed AND [2] triager unable to answer question  Answer Assessment - Initial Assessment Questions 1. NAME of MEDICINE: "What medicine(s) are you calling about?"     All 2. QUESTION: "What is your question?" (e.g., double dose of medicine, side effect)     Not sure how to take 3. PRESCRIBER: "Who prescribed the medicine?" Reason: if prescribed by specialist, call should be referred to that group.     Various  Protocols used: Medication Question Call-A-AH

## 2022-04-24 NOTE — Telephone Encounter (Signed)
  Chief Complaint: He called in about being a lone and his medications .    Symptoms: Perhaps needing help with his medications Frequency: N/A Pertinent Negatives: Patient denies N/A Disposition: '[]'$ ED /'[]'$ Urgent Care (no appt availability in office) / '[]'$ Appointment(In office/virtual)/ '[]'$  [facility]/ '[]'$ Home Care/ '[]'$ Refused Recommended Disposition /'[]'$ [facility] [facility]/ '[x]'$  Follow-up with PCP Additional Notes: Forwarded this to [facility]

## 2022-04-24 NOTE — Telephone Encounter (Signed)
I think they are going to have someone to come check on me.    I'm here by myself.     I take plenty of medications and I don't know what I'm doing.   Reason for Disposition  [1] Caller requesting NON-URGENT health information AND [2] PCP's office is the best resource    Pt. May possibly need help with his medications.    I wasn't able to get any information from him before he said,   "Never mind" and hung up  Answer Assessment - Initial Assessment Questions 1. REASON FOR CALL or QUESTION: "What is your reason for calling today?" or "How can I best help you?" or "What question do you have that I can help answer?"     Pt called in and mentioned "They are going to have someone to come do a check on me".   "I'm here by myself".   "I take plenty of medications and I don't know what I'm doing". When I asked how I could help him, like was he sick, etc. He said,  "Never mind".    "Never mind" and hung up before I could get any other information from him.  I'm sending this message over to [facility].  Protocols used: Information Only Call - No Triage-A-AH

## 2022-04-26 NOTE — Telephone Encounter (Signed)
We can place a referral for PCS services for him.

## 2022-04-29 ENCOUNTER — Ambulatory Visit (INDEPENDENT_AMBULATORY_CARE_PROVIDER_SITE_OTHER): Payer: Medicaid Other | Admitting: Orthopedic Surgery

## 2022-04-29 ENCOUNTER — Telehealth: Payer: Self-pay | Admitting: Emergency Medicine

## 2022-04-29 ENCOUNTER — Ambulatory Visit (INDEPENDENT_AMBULATORY_CARE_PROVIDER_SITE_OTHER): Payer: Medicaid Other

## 2022-04-29 ENCOUNTER — Telehealth: Payer: Self-pay | Admitting: Family Medicine

## 2022-04-29 DIAGNOSIS — M19011 Primary osteoarthritis, right shoulder: Secondary | ICD-10-CM

## 2022-04-29 NOTE — Telephone Encounter (Signed)
The patient is requesting PCS referral.  I explained to him that I will be submitting the requested referral and Dr Margarita Rana is in agreement

## 2022-04-29 NOTE — Telephone Encounter (Signed)
Signed PCS referral faxed to [place]

## 2022-04-29 NOTE — Telephone Encounter (Signed)
Spoke with patient . Patient voiced that confidential nurse services had called him and wanted [place] to to place a referral for services. Spoke with Eden Lathe. Opal Sidles spoke with the patient. Opal Sidles will place the referral for needs services per patient request.

## 2022-04-29 NOTE — Telephone Encounter (Signed)
Home Health Verbal Orders - Caller/Agency: [facility] Number: [id number]  Requesting Skilled Nursing at home.Marland Kitchen PCA Frequency: ?

## 2022-04-29 NOTE — Telephone Encounter (Signed)
Copied from [place] [id number]. Topic: General - Inquiry >> Apr 29, 2022  3:24 PM David Wyatt wrote: Reason for CRM: Pt stated he will be coming by the office today or tomorrow to drop off paperwork that Dr.Newlin needs to fill out for homecare. Pt mentioned he has two upcoming surgeries.  Pt also asked when he would be able to pick up the paperwork.  Please advise.
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
## 2022-04-30 NOTE — Telephone Encounter (Signed)
Can we refer him to another Urology practice? Thanks.

## 2022-04-30 NOTE — Telephone Encounter (Signed)
FYI

## 2022-05-01 NOTE — Telephone Encounter (Signed)
Opal Sidles can you please obtain the name of the Urologist he was scheduled to see as I would like to give him a call to make him aware of the situation? Thanks

## 2022-05-01 NOTE — Telephone Encounter (Signed)
Noted  

## 2022-05-08 ENCOUNTER — Ambulatory Visit (HOSPITAL_COMMUNITY): Payer: Medicaid Other | Attending: Vascular Surgery

## 2022-05-08 ENCOUNTER — Encounter: Payer: Medicaid Other | Admitting: Vascular Surgery

## 2022-05-13 ENCOUNTER — Telehealth: Payer: Self-pay | Admitting: Emergency Medicine

## 2022-05-13 NOTE — Telephone Encounter (Signed)
I spoke to the patient and informed him that I sent the referral for [facility]CS to [employer]  on 04/29/2022 and he should be receiving a call from them to schedule an assessment.  He said he doesn't answer calls if he doesn't know who is calling. I told him that he must answer the calls and/or return messages if he wants to be assessed for the services and he said he understood.

## 2022-05-13 NOTE — Telephone Encounter (Signed)
Copied from [place] [id number]. Topic: General - Other >> May 13, 2022  1:03 PM Chapman Fitch wrote: Reason for CRM: Pt called to see if the Home health paperwork was completed / please advise and call when ready to pick up

## 2022-05-13 NOTE — Telephone Encounter (Signed)
FYI

## 2022-05-22 ENCOUNTER — Encounter: Payer: Self-pay | Admitting: Vascular Surgery

## 2022-05-22 ENCOUNTER — Ambulatory Visit (INDEPENDENT_AMBULATORY_CARE_PROVIDER_SITE_OTHER): Payer: Medicaid Other | Admitting: Vascular Surgery

## 2022-05-22 ENCOUNTER — Ambulatory Visit (HOSPITAL_COMMUNITY)
Admission: RE | Admit: 2022-05-22 | Discharge: 2022-05-22 | Disposition: A | Discharge status: Home / Self Care | Payer: Medicaid Other | Source: Ambulatory Visit | Attending: Vascular Surgery | Admitting: Vascular Surgery

## 2022-05-22 VITALS — BP 135/92 | HR 77 | Temp 98.1°F | Resp 20 | Ht 68.0 in | Wt 159.0 lb

## 2022-05-22 DIAGNOSIS — I739 Peripheral vascular disease, unspecified: Secondary | ICD-10-CM | POA: Insufficient documentation

## 2022-05-22 LAB — VAS US ABI WITH/WO TBI
Left ABI: 0.77
Right ABI: 0.79

## 2022-05-22 NOTE — Progress Notes (Signed)
Patient ID: David Wyatt, male   DOB: 1960/10/25, 62 y.o.   MRN: [id number]  Reason for Consult: New Patient (Initial Visit)   Referred by [name], MD  Subjective:     HPI:  David Wyatt is a 62 y.o. male originally from [place].  He does have diabetes high cholesterol hypertension but no current or previous history of vascular disease or coronary artery disease.  He states that he is limited in his walking mostly by being tired but does not endorse any true claudication symptoms in his legs.  States that he does have numbness in the right leg never in the left leg.  Denies any tissue loss or ulceration.  No previous history of stroke, TIA or amaurosis and denies any personal or family history of aneurysm disease.  He currently walks sometimes with a cane sometimes with a walker.  He is a current every day smoker does not take any antiplatelet medications or anticoagulants.  Past Medical History:  Diagnosis Date   Angioedema of lips 07/28/2012   left upper (07/29/2012)   Arthritis    hands and back   Chronic back pain    Cocaine abuse ([facility])    Diabetic neuropathy ([facility])    High cholesterol    Hypertension    Neuropathy    Type II diabetes mellitus ([facility])    Family History  Problem Relation Age of Onset   Diabetes Mother    Kidney disease Mother    Hyperlipidemia Mother    Hyperlipidemia Father    Diabetes Father    Past Surgical History:  Procedure Laterality Date   BACK SURGERY     CYSTOSCOPY W/ URETERAL STENT PLACEMENT Right 02/23/2021   Procedure: CYSTOSCOPY WITH RETROGRADE PYELOGRAM/URETERAL STENT PLACEMENT;  Surgeon: Janith Lima, MD;  [facility];  Service: Urology;  Laterality: Right;   HERNIA REPAIR Right 04/01/2012   I & D EXTREMITY Left 12/13/2012   Procedure: IRRIGATION AND DEBRIDEMENT LEFT THUMB;  Surgeon: Tennis Must, MD;  Location: [facility];  Service: Orthopedics;  Laterality: Left;   INCISION AND DRAINAGE OF WOUND     boil on back/notes 07/14/2008  (07/29/2012)   INGUINAL HERNIA REPAIR  04/01/2012   Procedure: HERNIA REPAIR INGUINAL ADULT;  Surgeon: Haywood Lasso, MD;  Location: [facility];  Service: General;  Laterality: Right;   INGUINAL HERNIA REPAIR Left 09/06/2016   Procedure: OPEN REPAIR LEFT INGUINAL HERNIA;  Surgeon: Greer Pickerel, MD;  Location: [facility];  Service: General;  Laterality: Left;   INSERTION OF MESH Left 09/06/2016   Procedure: INSERTION OF MESH;  Surgeon: Greer Pickerel, MD;  Location: [facility];  Service: General;  Laterality: Left;   TONSILLECTOMY      Short Social History:  Social History   Tobacco Use   Smoking status: Every Day    Packs/day: 0.25    Years: 30.00    Total pack years: 7.50    Types: Cigarettes   Smokeless tobacco: Former    Quit date: 08/15/2015   Tobacco comments:    04/29/16 2  cigs daily, 10/27/17 sometimes < .25 PPD  Substance Use Topics   Alcohol use: No    Alcohol/week: 0.0 standard drinks of alcohol    Allergies  Allergen Reactions   Lisinopril Other (See Comments) and Swelling    Lip Swelling, angioedema  angioedema  Lip Swelling  Lip Swelling, angioedema    Current Outpatient Medications  Medication Sig Dispense Refill   amLODipine (NORVASC) 10 MG tablet Take  1 tablet (10 mg total) by mouth daily. 90 tablet 1   atorvastatin (LIPITOR) 40 MG tablet Take 1 tablet (40 mg total) by mouth daily. 90 tablet 1   Blood Glucose Monitoring Suppl (ACCU-CHEK GUIDE) w/Device KIT USE AS DIRECTED TO CHECK BLOOD SUGARS UP TO TWICE PER DAY 1 kit 0   buPROPion (WELLBUTRIN XL) 150 MG 24 hr tablet Take 1 tablet (150 mg total) by mouth daily. 90 tablet 1   cyclobenzaprine (FLEXERIL) 10 MG tablet Take 1 tablet (10 mg total) by mouth 3 (three) times daily as needed for muscle spasms. 60 tablet 1   DULoxetine (CYMBALTA) 60 MG capsule Take 1 capsule (60 mg total) by mouth daily. 90 capsule 1   famotidine (PEPCID) 10 MG tablet Take 1 tablet (10 mg total) by mouth 2 (two) times daily. 180 tablet 1    gabapentin (NEURONTIN) 300 MG capsule Take 2 capsules (600 mg total) by mouth 3 (three) times daily. 540 capsule 1   glipiZIDE (GLUCOTROL XL) 10 MG 24 hr tablet Take 1 tablet (10 mg total) by mouth daily with breakfast. 90 tablet 1   Insulin Glargine Solostar (LANTUS) 100 UNIT/ML Solostar Pen Inject 30 Units into the skin daily. 30 mL 6   insulin lispro (HUMALOG KWIKPEN) 100 UNIT/ML KwikPen Inject 2 Units into the skin 2 (two) times daily before lunch and supper. 15 mL 6   Insulin Pen Needle 31G X 5 MM MISC Use with insulin 100 each 5   Insulin Syringe-Needle U-100 (TRUEPLUS INSULIN SYRINGE) 31G X 5/16" 0.5 ML MISC 1-30 Units in the morning, at noon, in the evening, and at bedtime. 100 each 0   metFORMIN (GLUCOPHAGE) 1000 MG tablet Take 1 tablet (1,000 mg total) by mouth 2 (two) times daily. 180 tablet 1   predniSONE (DELTASONE) 20 MG tablet Take 1 tablet (20 mg total) by mouth daily with breakfast. 5 tablet 0   tamsulosin (FLOMAX) 0.4 MG CAPS capsule Take 1 capsule (0.4 mg total) by mouth daily. 90 capsule 1   traZODone (DESYREL) 100 MG tablet Take 1 tablet (100 mg total) by mouth at bedtime as needed for sleep. 90 tablet 1   No current facility-administered medications for this visit.    Review of Systems  Constitutional:  Constitutional negative. HENT: HENT negative.  Eyes: Eyes negative.  Respiratory: Positive for shortness of breath.  GI: Gastrointestinal negative.  Musculoskeletal: Musculoskeletal negative.  Skin: Skin negative.  Neurological: Positive for numbness.  Hematologic: Hematologic/lymphatic negative.  Psychiatric: Psychiatric negative.        Objective:  Objective   Vitals:   05/22/22 1113  BP: (!) 135/92  Pulse: 77  Resp: 20  Temp: 98.1 F (36.7 C)  SpO2: 97%  Weight: 159 lb (72.1 kg)  Height: '5\' 8"'$  (1.727 m)   Body mass index is 24.18 kg/m.  Physical Exam HENT:     Head: Normocephalic.     Nose: Nose normal.  Eyes:     Pupils: Pupils are equal,  round, and reactive to light.  Neck:     Vascular: No carotid bruit.  Cardiovascular:     Pulses:          Femoral pulses are 2+ on the right side and 2+ on the left side.      Popliteal pulses are 0 on the right side and 0 on the left side.       Dorsalis pedis pulses are 0 on the right side and 0 on the left side.  Abdominal:     General: Abdomen is flat.     Palpations: Abdomen is soft.  Skin:    General: Skin is warm.  Neurological:     General: No focal deficit present.     Mental Status: He is alert.  Psychiatric:        Mood and Affect: Mood normal.        Thought Content: Thought content normal.        Judgment: Judgment normal.     Data: ABI Findings:  +---------+------------------+-----+----------+--------+  Right   Rt Pressure (mmHg)IndexWaveform  Comment   +---------+------------------+-----+----------+--------+  Brachial 129                                        +---------+------------------+-----+----------+--------+  PTA     102               0.79 monophasic          +---------+------------------+-----+----------+--------+  DP      99                0.77 monophasic          +---------+------------------+-----+----------+--------+  Great Toe34                0.26                     +---------+------------------+-----+----------+--------+   +--------+------------------+-----+----------+-------+  Left   Lt Pressure (mmHg)IndexWaveform  Comment  +--------+------------------+-----+----------+-------+  [id number]                                      +--------+------------------+-----+----------+-------+  PTA    94                0.73 monophasic         +--------+------------------+-----+----------+-------+  DP     99                0.77 monophasic         +--------+------------------+-----+----------+-------+   +-------+-----------+-----------+------------+------------+  ABI/TBIToday's ABIToday's  TBIPrevious ABIPrevious TBI  +-------+-----------+-----------+------------+------------+  Right 0.79       0.26                                 +-------+-----------+-----------+------------+------------+  Left  0.77       absent                               +-------+-----------+-----------+------------+------------+           Summary:  Right: Resting right ankle-brachial index indicates moderate right lower  extremity arterial disease. The right toe-brachial index is abnormal.   Left: Resting left ankle-brachial index indicates moderate left lower  extremity arterial disease.      Assessment/Plan:    62 year old male with moderately depressed ABIs bilaterally which appear to be mostly asymptomatic although he does have some numbness in the right lower extremity but the left lower extremity toe pressure is absent and he has really no symptoms there.  I discussed with him the urgent need for smoking cessation as well as continued walking, protection of his feet and beginning the use of 81 mg of aspirin daily.  He cannot tell me  for sure that he takes a statin but it is on his medication list and he states that he takes everything that is given to him daily.  Plan will be to follow-up in 1 year with repeat ABIs unless he has issues prior to that.  Hopefully at that time he has completely quit smoking.     Waynetta Sandy MD [facility]

## 2022-06-07 ENCOUNTER — Other Ambulatory Visit: Payer: Self-pay

## 2022-06-07 ENCOUNTER — Emergency Department (HOSPITAL_COMMUNITY)
Admission: EM | Admit: 2022-06-07 | Discharge: 2022-06-07 | Disposition: A | Discharge status: Home / Self Care | Payer: Medicaid Other | Attending: Emergency Medicine | Admitting: Emergency Medicine

## 2022-06-07 ENCOUNTER — Other Ambulatory Visit (HOSPITAL_COMMUNITY): Payer: Self-pay

## 2022-06-07 DIAGNOSIS — E1140 Type 2 diabetes mellitus with diabetic neuropathy, unspecified: Secondary | ICD-10-CM | POA: Insufficient documentation

## 2022-06-07 DIAGNOSIS — Z794 Long term (current) use of insulin: Secondary | ICD-10-CM | POA: Diagnosis not present

## 2022-06-07 DIAGNOSIS — N3000 Acute cystitis without hematuria: Secondary | ICD-10-CM | POA: Insufficient documentation

## 2022-06-07 DIAGNOSIS — Z7984 Long term (current) use of oral hypoglycemic drugs: Secondary | ICD-10-CM | POA: Insufficient documentation

## 2022-06-07 DIAGNOSIS — I10 Essential (primary) hypertension: Secondary | ICD-10-CM | POA: Diagnosis not present

## 2022-06-07 DIAGNOSIS — Z79899 Other long term (current) drug therapy: Secondary | ICD-10-CM | POA: Diagnosis not present

## 2022-06-07 DIAGNOSIS — R112 Nausea with vomiting, unspecified: Secondary | ICD-10-CM

## 2022-06-07 DIAGNOSIS — R109 Unspecified abdominal pain: Secondary | ICD-10-CM | POA: Diagnosis present

## 2022-06-07 LAB — COMPREHENSIVE METABOLIC PANEL
ALT: 12 U/L (ref 0–44)
AST: 11 U/L — ABNORMAL LOW (ref 15–41)
Albumin: 3.1 g/dL — ABNORMAL LOW (ref 3.5–5.0)
Alkaline Phosphatase: 93 U/L (ref 38–126)
Anion gap: 12 (ref 5–15)
BUN: 31 mg/dL — ABNORMAL HIGH (ref 8–23)
CO2: 23 mmol/L (ref 22–32)
Calcium: 8.7 mg/dL — ABNORMAL LOW (ref 8.9–10.3)
Chloride: 98 mmol/L (ref 98–111)
Creatinine, Ser: 1.51 mg/dL — ABNORMAL HIGH (ref 0.61–1.24)
GFR, Estimated: 52 mL/min — ABNORMAL LOW (ref >60)
Glucose, Bld: 447 mg/dL — ABNORMAL HIGH (ref 70–99)
Potassium: 4.2 mmol/L (ref 3.5–5.1)
Sodium: 133 mmol/L — ABNORMAL LOW (ref 135–145)
Total Bilirubin: 1.1 mg/dL (ref 0.3–1.2)
Total Protein: 7 g/dL (ref 6.5–8.1)

## 2022-06-07 LAB — URINALYSIS, ROUTINE W REFLEX MICROSCOPIC
Bilirubin Urine: NEGATIVE
Glucose, UA: >=500 mg/dL — AB
Hgb urine dipstick: NEGATIVE
Ketones, ur: NEGATIVE mg/dL
Nitrite: NEGATIVE
Protein, ur: NEGATIVE mg/dL
Specific Gravity, Urine: 1.023 (ref 1.005–1.030)
pH: 5 (ref 5.0–8.0)

## 2022-06-07 LAB — CBC WITH DIFFERENTIAL/PLATELET
Abs Immature Granulocytes: 0.03 10*3/uL (ref 0.00–0.07)
Basophils Absolute: 0 10*3/uL (ref 0.0–0.1)
Basophils Relative: 0 %
Eosinophils Absolute: 0.2 10*3/uL (ref 0.0–0.5)
Eosinophils Relative: 3 %
HCT: 41.6 % (ref 39.0–52.0)
Hemoglobin: 14.1 g/dL (ref 13.0–17.0)
Immature Granulocytes: 0 %
Lymphocytes Relative: 24 %
Lymphs Abs: 1.7 10*3/uL (ref 0.7–4.0)
MCH: 30.9 pg (ref 26.0–34.0)
MCHC: 33.9 g/dL (ref 30.0–36.0)
MCV: 91 fL (ref 80.0–100.0)
Monocytes Absolute: 0.9 10*3/uL (ref 0.1–1.0)
Monocytes Relative: 13 %
Neutro Abs: 4.1 10*3/uL (ref 1.7–7.7)
Neutrophils Relative %: 60 %
Platelets: 424 10*3/uL — ABNORMAL HIGH (ref 150–400)
RBC: 4.57 MIL/uL (ref 4.22–5.81)
RDW: 13.1 % (ref 11.5–15.5)
WBC: 6.9 10*3/uL (ref 4.0–10.5)
nRBC: 0 % (ref 0.0–0.2)

## 2022-06-07 LAB — HEPATITIS PANEL, ACUTE
HCV Ab: REACTIVE — AB
Hep A IgM: NONREACTIVE
Hep B C IgM: NONREACTIVE
Hepatitis B Surface Ag: NONREACTIVE

## 2022-06-07 LAB — LIPASE, BLOOD: Lipase: 23 U/L (ref 11–51)

## 2022-06-07 LAB — CBG MONITORING, ED: Glucose-Capillary: 338 mg/dL — ABNORMAL HIGH (ref 70–99)

## 2022-06-07 MED ORDER — CEPHALEXIN 500 MG PO CAPS
500.0000 mg | ORAL_CAPSULE | Freq: Four times a day (QID) | ORAL | 0 refills | Status: DC
  Filled 2022-06-07: qty 28, 7d supply, fill #0

## 2022-06-07 MED ORDER — INSULIN ASPART 100 UNIT/ML IJ SOLN
10.0000 [IU] | Freq: Once | INTRAMUSCULAR | Status: AC
  Administered 2022-06-07: 10 [IU] via SUBCUTANEOUS

## 2022-06-07 MED ORDER — SODIUM CHLORIDE 0.9 % IV BOLUS
1000.0000 mL | Freq: Once | INTRAVENOUS | Status: AC
  Administered 2022-06-07: 1000 mL via INTRAVENOUS

## 2022-06-07 NOTE — Discharge Planning (Signed)
[facility] will deliver Rx to patient at bedside prior to discharge from hospital.

## 2022-06-07 NOTE — ED Notes (Signed)
TOC delivered pt meds.

## 2022-06-07 NOTE — ED Notes (Signed)
Pt resting comfortably at this time.

## 2022-06-07 NOTE — ED Notes (Signed)
Spoke with Maudie Mercury in lab, added on Hepatitis C panel

## 2022-06-07 NOTE — ED Triage Notes (Signed)
[facility]. Pt c/o n/v/diarehea x 1day. Also lower back pain 9/10 and left sided tingling sensations. Afebrile a and o x 4 gcs 15. Uses walker

## 2022-06-07 NOTE — ED Provider Notes (Signed)
[facility] Provider Note   CSN: [id number] Arrival date & time: 06/07/22  [id number]     History  Chief Complaint  Patient presents with   Nausea    N/v/d 1 day    David Wyatt is a 62 y.o. male with medical history of arthritis, chronic back pain, cocaine abuse, diabetic neuropathy, hypertension, type 2 diabetes.  Patient presents to ED for evaluation of abdominal pain.  Patient reports that this morning at 4 AM he woke up with nausea, vomiting and diarrhea.  Patient reports symptoms been persistent since waking up.  Patient reports he went to bed feeling fine.  The patient is unsure of sick contacts, states that he currently lives at the [facility].  Patient denies alcohol use, NSAID use.  Patient denies history of abdominal surgeries.  Patient reports last bowel movement was this morning.  Patient denies fevers, dysuria, chest pain or shortness of breath.  Patient also complaining of back pain however reports his back pain is chronic in nature, unchanged from baseline.  Patient is on to say that he is also having tingling throughout his entire right side.  The patient reports he has had tingling for "probably over 8 years".  Patient reports that his tingling today is unchanged from baseline.  The patient denies any one-sided weakness.  Patient denies any facial droop or slurred speech.  Patient also goes on to state that he has dysuria however only present when he "has to push his urine out". Denies flank pain.  HPI     Home Medications Prior to Admission medications   Medication Sig Start Date End Date Taking? Authorizing Provider  cephALEXin (KEFLEX) 500 MG capsule Take 1 capsule (500 mg total) by mouth 4 (four) times daily. 06/07/22  Yes Azucena Cecil, PA-C  amLODipine (NORVASC) 10 MG tablet Take 1 tablet (10 mg total) by mouth daily. 04/10/22   [name], MD  atorvastatin (LIPITOR) 40 MG tablet Take 1 tablet (40 mg total) by mouth daily.  04/10/22   [name], MD  Blood Glucose Monitoring Suppl (ACCU-CHEK GUIDE) w/Device KIT USE AS DIRECTED TO CHECK BLOOD SUGARS UP TO TWICE PER DAY 01/23/22   Merrily Brittle, DO  buPROPion (WELLBUTRIN XL) 150 MG 24 hr tablet Take 1 tablet (150 mg total) by mouth daily. 04/10/22   [name], MD  cyclobenzaprine (FLEXERIL) 10 MG tablet Take 1 tablet (10 mg total) by mouth 3 (three) times daily as needed for muscle spasms. 04/10/22   [name], MD  DULoxetine (CYMBALTA) 60 MG capsule Take 1 capsule (60 mg total) by mouth daily. 04/10/22   [name], MD  famotidine (PEPCID) 10 MG tablet Take 1 tablet (10 mg total) by mouth 2 (two) times daily. 04/10/22   [name], MD  gabapentin (NEURONTIN) 300 MG capsule Take 2 capsules (600 mg total) by mouth 3 (three) times daily. 04/10/22   [name], MD  glipiZIDE (GLUCOTROL XL) 10 MG 24 hr tablet Take 1 tablet (10 mg total) by mouth daily with breakfast. 04/10/22   [name], MD  Insulin Glargine Solostar (LANTUS) 100 UNIT/ML Solostar Pen Inject 30 Units into the skin daily. 04/10/22   [name], MD  insulin lispro (HUMALOG KWIKPEN) 100 UNIT/ML KwikPen Inject 2 Units into the skin 2 (two) times daily before lunch and supper. 04/10/22   [name], MD  Insulin Pen Needle 31G X 5 MM MISC Use with insulin 04/11/22   Elsie Stain, MD  Insulin Syringe-Needle U-100 (TRUEPLUS INSULIN SYRINGE) 31G X 5/16" 0.5 ML MISC 1-30 Units in the morning, at noon, in the evening, and at bedtime. 03/28/22   Barrett, Evelene Croon, PA-C  metFORMIN (GLUCOPHAGE) 1000 MG tablet Take 1 tablet (1,000 mg total) by mouth 2 (two) times daily. 04/10/22   [name], MD  predniSONE (DELTASONE) 20 MG tablet Take 1 tablet (20 mg total) by mouth daily with breakfast. 04/10/22   [name], MD  tamsulosin (FLOMAX) 0.4 MG CAPS capsule Take 1 capsule (0.4 mg total) by mouth daily. 04/10/22   [name], MD  traZODone (DESYREL) 100 MG tablet Take 1  tablet (100 mg total) by mouth at bedtime as needed for sleep. 04/10/22   [name], MD  loratadine (CLARITIN) 10 MG tablet Take 1 tablet (10 mg total) by mouth daily. Patient not taking: No sig reported 06/03/17 07/20/20  [name], MD      Allergies    Lisinopril    Review of Systems   Review of Systems  Gastrointestinal:  Positive for diarrhea, nausea and vomiting.  Genitourinary:  Positive for dysuria.  All other systems reviewed and are negative.   Physical Exam Updated Vital Signs BP 131/70   Pulse 82   Temp 98 F (36.7 C) (Oral)   Resp (!) 24   Ht 5\' 8"  (1.727 m)   Wt 74.8 kg   SpO2 94%   BMI 25.09 kg/m  Physical Exam Vitals and nursing note reviewed.  Constitutional:      General: He is not in acute distress.    Appearance: Normal appearance. He is not ill-appearing, toxic-appearing or diaphoretic.  HENT:     Head: Normocephalic and atraumatic.     Nose: Nose normal. No congestion.     Mouth/Throat:     Mouth: Mucous membranes are moist.     Pharynx: Oropharynx is clear.  Eyes:     Extraocular Movements: Extraocular movements intact.     Conjunctiva/sclera: Conjunctivae normal.     Pupils: Pupils are equal, round, and reactive to light.  Cardiovascular:     Rate and Rhythm: Normal rate and regular rhythm.  Pulmonary:     Effort: Pulmonary effort is normal.     Breath sounds: Normal breath sounds. No wheezing.  Abdominal:     General: Abdomen is flat. Bowel sounds are normal.     Palpations: Abdomen is soft.     Tenderness: There is generalized abdominal tenderness.  Musculoskeletal:     Cervical back: Normal range of motion and neck supple. No tenderness.  Skin:    General: Skin is warm and dry.     Capillary Refill: Capillary refill takes less than 2 seconds.  Neurological:     Mental Status: He is alert and oriented to person, place, and time.     ED Results / Procedures / Treatments   Labs (all labs ordered are listed, but only  abnormal results are displayed) Labs Reviewed  CBC WITH DIFFERENTIAL/PLATELET - Abnormal; Notable for the following components:      Result Value   Platelets 424 (*)    All other components within normal limits  COMPREHENSIVE METABOLIC PANEL - Abnormal; Notable for the following components:   Sodium 133 (*)    Glucose, Bld 447 (*)    BUN 31 (*)    Creatinine, Ser 1.51 (*)    Calcium 8.7 (*)    Albumin 3.1 (*)    AST 11 (*)    GFR, Estimated 52 (*)  All other components within normal limits  URINALYSIS, ROUTINE W REFLEX MICROSCOPIC - Abnormal; Notable for the following components:   APPearance CLOUDY (*)    Glucose, UA >=500 (*)    Leukocytes,Ua TRACE (*)    Bacteria, UA RARE (*)    Non Squamous Epithelial 0-5 (*)    All other components within normal limits  CBG MONITORING, ED - Abnormal; Notable for the following components:   Glucose-Capillary 338 (*)    All other components within normal limits  URINE CULTURE  LIPASE, BLOOD  HEPATITIS PANEL, ACUTE    EKG None  Radiology No results found.  Procedures Procedures   Medications Ordered in ED Medications  sodium chloride 0.9 % bolus 1,000 mL (0 mLs Intravenous Stopped 06/07/22 1038)  insulin aspart (novoLOG) injection 10 Units (10 Units Subcutaneous Given 06/07/22 [id number])    ED Course/ Medical Decision Making/ A&P  Medical Decision Making  62 year old male presents ED for evaluation.  Please see HPI for further details.  On examination the patient is afebrile and nontachycardic.  Patient lung sounds are clear bilaterally, he is not hypoxic.  Abdomen is soft and compressible throughout with generalized tenderness noted.  Neurological examination at baseline.  Patient in no acute distress, nontoxic in appearance.  CBC without leukocytosis or anemia.  CMP with decreased sodium to 133, elevated glucose to 447, elevated BUN to 31, creatinine 1.51 which is patient baseline, no elevated anion gap.  Lipase within normal  limits.  Urinalysis shows trace leuks, rare bacteria, will culture and the patient is complaining of dysuria so we will treat him with Keflex.  Patient provided 1 L fluid, 10 units of insulin.  Patient requesting that we test him for hepatitis as he was told by someone that he has hepatitis.  On chart review, it appears that the patient does have a diagnosis of hepatitis however he reports that no one has advised him of this.  Will draw an acute hepatitis panel for this reason.  On reassessment, patient blood sugar decreasing.  The patient has had no nausea or vomiting he has been here.  The patient will be discharged home with Keflex for UTI and advised to follow-up with [facility].  Patient given return precautions and he is voiced understanding.  Patient medication for UTI sent to transitions of care pharmacy.  Patient discharged in stable condition.  Final Clinical Impression(s) / ED Diagnoses Final diagnoses:  Nausea vomiting and diarrhea  Acute cystitis without hematuria    Rx / DC Orders ED Discharge Orders          Ordered    cephALEXin (KEFLEX) 500 MG capsule  4 times daily        06/07/22 1037              Lawana Chambers 06/07/22 1217    Cristie Hem, MD 06/07/22 [phone]

## 2022-06-07 NOTE — ED Notes (Signed)
Pt states bilateral lower leg and feet cramping. MD notified

## 2022-06-07 NOTE — Discharge Instructions (Addendum)
Return to the ED with any new or worsening signs or symptoms Please begin taking antibiotic I placed you on for UTI.  You will take this 4 times daily for the next 7 days.  I have sent this to the transitional care pharmacy here at [facility].  Please pick this up upon your discharge. Please follow-up with community health services for reevaluation Please continue taking diabetic medication as prescribed

## 2022-06-09 LAB — URINE CULTURE

## 2022-06-12 ENCOUNTER — Other Ambulatory Visit: Payer: Self-pay

## 2022-06-12 ENCOUNTER — Emergency Department (HOSPITAL_COMMUNITY)
Admission: EM | Admit: 2022-06-12 | Discharge: 2022-06-12 | Disposition: A | Discharge status: Home / Self Care | Payer: Medicaid Other | Attending: Emergency Medicine | Admitting: Emergency Medicine

## 2022-06-12 ENCOUNTER — Other Ambulatory Visit (HOSPITAL_COMMUNITY): Payer: Self-pay

## 2022-06-12 DIAGNOSIS — E1140 Type 2 diabetes mellitus with diabetic neuropathy, unspecified: Secondary | ICD-10-CM | POA: Insufficient documentation

## 2022-06-12 DIAGNOSIS — R079 Chest pain, unspecified: Secondary | ICD-10-CM | POA: Diagnosis not present

## 2022-06-12 DIAGNOSIS — I10 Essential (primary) hypertension: Secondary | ICD-10-CM | POA: Diagnosis not present

## 2022-06-12 DIAGNOSIS — Z7984 Long term (current) use of oral hypoglycemic drugs: Secondary | ICD-10-CM | POA: Diagnosis not present

## 2022-06-12 DIAGNOSIS — R112 Nausea with vomiting, unspecified: Secondary | ICD-10-CM | POA: Diagnosis not present

## 2022-06-12 DIAGNOSIS — H538 Other visual disturbances: Secondary | ICD-10-CM | POA: Diagnosis not present

## 2022-06-12 DIAGNOSIS — Z794 Long term (current) use of insulin: Secondary | ICD-10-CM | POA: Diagnosis not present

## 2022-06-12 DIAGNOSIS — R509 Fever, unspecified: Secondary | ICD-10-CM | POA: Diagnosis not present

## 2022-06-12 DIAGNOSIS — R0602 Shortness of breath: Secondary | ICD-10-CM | POA: Insufficient documentation

## 2022-06-12 DIAGNOSIS — R197 Diarrhea, unspecified: Secondary | ICD-10-CM | POA: Diagnosis not present

## 2022-06-12 DIAGNOSIS — Z79899 Other long term (current) drug therapy: Secondary | ICD-10-CM | POA: Insufficient documentation

## 2022-06-12 LAB — CBC WITH DIFFERENTIAL/PLATELET
Abs Immature Granulocytes: 0.14 10*3/uL — ABNORMAL HIGH (ref 0.00–0.07)
Basophils Absolute: 0 10*3/uL (ref 0.0–0.1)
Basophils Relative: 0 %
Eosinophils Absolute: 0.2 10*3/uL (ref 0.0–0.5)
Eosinophils Relative: 1 %
HCT: 41.7 % (ref 39.0–52.0)
Hemoglobin: 13.4 g/dL (ref 13.0–17.0)
Immature Granulocytes: 1 %
Lymphocytes Relative: 9 %
Lymphs Abs: 1.5 10*3/uL (ref 0.7–4.0)
MCH: 30.2 pg (ref 26.0–34.0)
MCHC: 32.1 g/dL (ref 30.0–36.0)
MCV: 93.9 fL (ref 80.0–100.0)
Monocytes Absolute: 1.1 10*3/uL — ABNORMAL HIGH (ref 0.1–1.0)
Monocytes Relative: 6 %
Neutro Abs: 14.3 10*3/uL — ABNORMAL HIGH (ref 1.7–7.7)
Neutrophils Relative %: 83 %
Platelets: 434 10*3/uL — ABNORMAL HIGH (ref 150–400)
RBC: 4.44 MIL/uL (ref 4.22–5.81)
RDW: 13.1 % (ref 11.5–15.5)
WBC: 17.3 10*3/uL — ABNORMAL HIGH (ref 4.0–10.5)
nRBC: 0 % (ref 0.0–0.2)

## 2022-06-12 LAB — COMPREHENSIVE METABOLIC PANEL
ALT: 9 U/L (ref 0–44)
AST: 18 U/L (ref 15–41)
Albumin: 3.1 g/dL — ABNORMAL LOW (ref 3.5–5.0)
Alkaline Phosphatase: 93 U/L (ref 38–126)
Anion gap: 11 (ref 5–15)
BUN: 14 mg/dL (ref 8–23)
CO2: 22 mmol/L (ref 22–32)
Calcium: 8.7 mg/dL — ABNORMAL LOW (ref 8.9–10.3)
Chloride: 100 mmol/L (ref 98–111)
Creatinine, Ser: 1.12 mg/dL (ref 0.61–1.24)
GFR, Estimated: >60 mL/min (ref >60)
Glucose, Bld: 440 mg/dL — ABNORMAL HIGH (ref 70–99)
Potassium: 4.2 mmol/L (ref 3.5–5.1)
Sodium: 133 mmol/L — ABNORMAL LOW (ref 135–145)
Total Bilirubin: 0.8 mg/dL (ref 0.3–1.2)
Total Protein: 7 g/dL (ref 6.5–8.1)

## 2022-06-12 LAB — LIPASE, BLOOD: Lipase: 38 U/L (ref 11–51)

## 2022-06-12 LAB — CBG MONITORING, ED: Glucose-Capillary: 304 mg/dL — ABNORMAL HIGH (ref 70–99)

## 2022-06-12 MED ORDER — AZITHROMYCIN 500 MG PO TABS
500.0000 mg | ORAL_TABLET | Freq: Every day | ORAL | 0 refills | Status: AC
  Filled 2022-06-12: qty 4, 4d supply, fill #0

## 2022-06-12 MED ORDER — INSULIN ASPART 100 UNIT/ML IJ SOLN
10.0000 [IU] | Freq: Once | INTRAMUSCULAR | Status: AC
  Administered 2022-06-12: 10 [IU] via INTRAVENOUS

## 2022-06-12 MED ORDER — SODIUM CHLORIDE 0.9 % IV BOLUS
1000.0000 mL | Freq: Once | INTRAVENOUS | Status: AC
  Administered 2022-06-12: 1000 mL via INTRAVENOUS

## 2022-06-12 MED ORDER — LOPERAMIDE HCL 2 MG PO CAPS
2.0000 mg | ORAL_CAPSULE | Freq: Four times a day (QID) | ORAL | 0 refills | Status: DC | PRN
  Filled 2022-06-12: qty 12, 3d supply, fill #0

## 2022-06-12 MED ORDER — LACTATED RINGERS IV BOLUS
1000.0000 mL | Freq: Once | INTRAVENOUS | Status: AC
  Administered 2022-06-12: 1000 mL via INTRAVENOUS

## 2022-06-12 MED ORDER — AZITHROMYCIN 250 MG PO TABS
500.0000 mg | ORAL_TABLET | Freq: Every day | ORAL | Status: DC
  Administered 2022-06-12: 500 mg via ORAL
  Filled 2022-06-12: qty 2

## 2022-06-12 NOTE — ED Triage Notes (Signed)
David Wyatt is a 62 y.o. male arrived via ems for back pain pt reports I am tired of sleeping on the floor and having to walk to get something to eat Pt states I am just going to say I want to end it because I am tired of sleeping on the floor "If I have to say I am a alcoholic I will say that"  Pt reports that he does not drink Pt also reports high blood sugar states was high since last visit here

## 2022-06-12 NOTE — ED Provider Notes (Signed)
[facility] Provider Note   CSN: [id number] Arrival date & time: 06/12/22  1345     History  Chief Complaint  Patient presents with   Back Pain    David Wyatt is a 62 y.o. male  with medical history of arthritis, chronic back pain, cocaine abuse, diabetic neuropathy, hypertension, type 2 diabetes presented for 3 days of persistent diarrhea.  Patient states he was recently on antibiotics but does not know what or what it was for.  Patient denies any new types of foods or travel.  Patient states he has chronic back pain from back surgery many years ago but he is not here today for back pain he is here for the diarrhea.  Patient states he is able to tolerate food and fluids orally and currently lives at the [facility].  Patient denied any pain at this time.  Patient had chest pain, shortness of breath, fever/chills, nausea/vomiting, change in vision, change in sensation/motor skills, blood thinners  Home Medications Prior to Admission medications   Medication Sig Start Date End Date Taking? Authorizing Provider  amLODipine (NORVASC) 10 MG tablet Take 1 tablet (10 mg total) by mouth daily. 04/10/22   [name], MD  atorvastatin (LIPITOR) 40 MG tablet Take 1 tablet (40 mg total) by mouth daily. 04/10/22   [name], MD  Blood Glucose Monitoring Suppl (ACCU-CHEK GUIDE) w/Device KIT USE AS DIRECTED TO CHECK BLOOD SUGARS UP TO TWICE PER DAY 01/23/22   Merrily Brittle, DO  buPROPion (WELLBUTRIN XL) 150 MG 24 hr tablet Take 1 tablet (150 mg total) by mouth daily. 04/10/22   [name], MD  cephALEXin (KEFLEX) 500 MG capsule Take 1 capsule (500 mg total) by mouth 4 (four) times daily. 06/07/22   Azucena Cecil, PA-C  cyclobenzaprine (FLEXERIL) 10 MG tablet Take 1 tablet (10 mg total) by mouth 3 (three) times daily as needed for muscle spasms. 04/10/22   [name], MD  DULoxetine (CYMBALTA) 60 MG capsule Take 1 capsule (60 mg total) by  mouth daily. 04/10/22   [name], MD  famotidine (PEPCID) 10 MG tablet Take 1 tablet (10 mg total) by mouth 2 (two) times daily. 04/10/22   [name], MD  gabapentin (NEURONTIN) 300 MG capsule Take 2 capsules (600 mg total) by mouth 3 (three) times daily. 04/10/22   [name], MD  glipiZIDE (GLUCOTROL XL) 10 MG 24 hr tablet Take 1 tablet (10 mg total) by mouth daily with breakfast. 04/10/22   [name], MD  Insulin Glargine Solostar (LANTUS) 100 UNIT/ML Solostar Pen Inject 30 Units into the skin daily. 04/10/22   [name], MD  insulin lispro (HUMALOG KWIKPEN) 100 UNIT/ML KwikPen Inject 2 Units into the skin 2 (two) times daily before lunch and supper. 04/10/22   [name], MD  Insulin Pen Needle 31G X 5 MM MISC Use with insulin 04/11/22   Elsie Stain, MD  Insulin Syringe-Needle U-100 (TRUEPLUS INSULIN SYRINGE) 31G X 5/16" 0.5 ML MISC 1-30 Units in the morning, at noon, in the evening, and at bedtime. 03/28/22   Barrett, Evelene Croon, PA-C  metFORMIN (GLUCOPHAGE) 1000 MG tablet Take 1 tablet (1,000 mg total) by mouth 2 (two) times daily. 04/10/22   [name], MD  predniSONE (DELTASONE) 20 MG tablet Take 1 tablet (20 mg total) by mouth daily with breakfast. 04/10/22   [name], MD  tamsulosin (FLOMAX) 0.4 MG CAPS capsule Take 1 capsule (0.4 mg total) by mouth daily. 04/10/22  [name], MD  traZODone (DESYREL) 100 MG tablet Take 1 tablet (100 mg total) by mouth at bedtime as needed for sleep. 04/10/22   [name], MD  loratadine (CLARITIN) 10 MG tablet Take 1 tablet (10 mg total) by mouth daily. Patient not taking: No sig reported 06/03/17 07/20/20  [name], MD      Allergies    Lisinopril    Review of Systems   Diarrhea See HPI  Physical Exam Updated Vital Signs Temp 98.2 F (36.8 C)   Ht 5\' 8"  (1.727 m)   Wt 74.8 kg   BMI 25.09 kg/m  Physical Exam Vitals reviewed.  Constitutional:      General: He is not in acute  distress. HENT:     Head: Normocephalic and atraumatic.  Eyes:     Extraocular Movements: Extraocular movements intact.     Conjunctiva/sclera: Conjunctivae normal.     Pupils: Pupils are equal, round, and reactive to light.  Cardiovascular:     Rate and Rhythm: Normal rate and regular rhythm.     Pulses: Normal pulses.     Heart sounds: Normal heart sounds.     Comments: 2+ bilateral radial/dorsalis pedis pulses with regular rate Pulmonary:     Effort: Pulmonary effort is normal. No respiratory distress.     Breath sounds: Normal breath sounds.  Abdominal:     Palpations: Abdomen is soft.     Tenderness: There is no abdominal tenderness. There is no guarding or rebound.  Musculoskeletal:        General: Tenderness (Tenderness when I palpated surgical scar are on midline lumbar region; no step-off/crepitus/abnormalities palpated) present. Normal range of motion.     Cervical back: Normal range of motion and neck supple.     Comments: 5 out of 5 bilateral grip/leg extension strength  Skin:    General: Skin is warm and dry.     Capillary Refill: Capillary refill takes less than 2 seconds.  Neurological:     General: No focal deficit present.     Mental Status: He is alert and oriented to person, place, and time.     Comments: Sensation intact in all 4 limbs  Psychiatric:        Mood and Affect: Mood normal.     ED Results / Procedures / Treatments   Labs (all labs ordered are listed, but only abnormal results are displayed) Labs Reviewed - No data to display  EKG None  Radiology No results found.  Procedures Procedures    Medications Ordered in ED Medications - No data to display  ED Course/ Medical Decision Making/ A&P                             Medical Decision Making  David Wyatt 62 y.o. presented today for diarrhea. Working DDx that I considered at this time includes, but not limited to, viral illness, C. difficile, malabsorption, dehydration,  electrolyte abnormalities.  R/o DDx: Cannot be determined at this time  Review of prior external notes: 06/07/2022 ED  Unique Tests and My Interpretation:  Point-of-care CBG: pending CBC: pending CMP: pending EKG: pending C. difficile: pending  Discussion with Independent Historian: none at this time  Discussion of Management of Tests: None  Risk: Cannot be determined at this time  Risk Stratification Score: none  Plan: Patient presented for diarrhea. On exam patient was in no acute distress and stable vitals.  Patient repeatedly Saying he had  used the bathroom and would not answer my questions until he was able to go to the bathroom.  When patient returned from bathroom patient allowed me to ask questions and prior physical exam.  Patient's physical exam was largely unremarkable however patient did have tenderness when I palpated old surgical scar on lumbar midline.  Scar did not appear infected and I suspect the pain is chronic as patient said he has chronic low back pain and that he is not here today for that.  Labs will be ordered along with fluids and patient will be evaluated.  C. difficile was ordered as well due to patient recently being on Keflex for UTI.  Patient stable at this time.  Patient was signed out to [employer], PA-C at 1500.  Plan as patient will be sent home pending lab results.  Patient will be given a liter of fluid and is stable at this time.         Final Clinical Impression(s) / ED Diagnoses Final diagnoses:  None    Rx / DC Orders ED Discharge Orders     None         Elvina Sidle 06/12/22 1505    Pattricia Boss, MD 06/13/22 1340

## 2022-06-12 NOTE — ED Notes (Signed)
Pt refusing last set of vitals states his ride is here and has to go

## 2022-06-12 NOTE — ED Triage Notes (Signed)
Pt reports that he did eat a peanut butter and jelly sandwich pta
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
## 2022-06-12 NOTE — Discharge Instructions (Addendum)
You came to the emergency department today with continued diarrhea.  You do have an elevation in your infection counts I believe we need to treat you for bacterial diarrhea.  Please take the antibiotic as prescribed.  It may upset your stomach so take it with food.  It was a pleasure to meet you and we hope you feel better.  Do not hesitate to return with any worsening symptoms.

## 2022-06-13 ENCOUNTER — Other Ambulatory Visit (HOSPITAL_COMMUNITY): Payer: Self-pay

## 2022-06-15 ENCOUNTER — Emergency Department (HOSPITAL_COMMUNITY)
Admission: EM | Admit: 2022-06-15 | Discharge: 2022-06-15 | Disposition: A | Discharge status: Home / Self Care | Payer: Medicaid Other | Attending: Emergency Medicine | Admitting: Emergency Medicine

## 2022-06-15 ENCOUNTER — Encounter (HOSPITAL_COMMUNITY): Payer: Self-pay

## 2022-06-15 DIAGNOSIS — H5789 Other specified disorders of eye and adnexa: Secondary | ICD-10-CM
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
## 2022-06-17 ENCOUNTER — Emergency Department (HOSPITAL_COMMUNITY)
Admission: EM | Admit: 2022-06-17 | Discharge: 2022-06-18 | Disposition: A | Discharge status: Home / Self Care | Payer: Medicaid Other | Attending: Emergency Medicine | Admitting: Emergency Medicine

## 2022-06-17 DIAGNOSIS — F1994 Other psychoactive substance use, unspecified with psychoactive substance-induced mood disorder: Secondary | ICD-10-CM | POA: Diagnosis not present

## 2022-06-17 DIAGNOSIS — R45851 Suicidal ideations: Secondary | ICD-10-CM

## 2022-06-17 DIAGNOSIS — E1165 Type 2 diabetes mellitus with hyperglycemia: Secondary | ICD-10-CM | POA: Insufficient documentation

## 2022-06-17 DIAGNOSIS — F1490 Cocaine use, unspecified, uncomplicated: Secondary | ICD-10-CM | POA: Diagnosis present

## 2022-06-17 DIAGNOSIS — F17210 Nicotine dependence, cigarettes, uncomplicated: Secondary | ICD-10-CM | POA: Insufficient documentation

## 2022-06-17 DIAGNOSIS — I10 Essential (primary) hypertension: Secondary | ICD-10-CM | POA: Diagnosis not present

## 2022-06-17 DIAGNOSIS — F1424 Cocaine dependence with cocaine-induced mood disorder: Secondary | ICD-10-CM | POA: Diagnosis not present

## 2022-06-17 DIAGNOSIS — F331 Major depressive disorder, recurrent, moderate: Secondary | ICD-10-CM | POA: Diagnosis present

## 2022-06-17 LAB — CBC
HCT: 37.8 % — ABNORMAL LOW (ref 39.0–52.0)
Hemoglobin: 12.5 g/dL — ABNORMAL LOW (ref 13.0–17.0)
MCH: 30.6 pg (ref 26.0–34.0)
MCHC: 33.1 g/dL (ref 30.0–36.0)
MCV: 92.6 fL (ref 80.0–100.0)
Platelets: 418 10*3/uL — ABNORMAL HIGH (ref 150–400)
RBC: 4.08 MIL/uL — ABNORMAL LOW (ref 4.22–5.81)
RDW: 13.3 % (ref 11.5–15.5)
WBC: 6.2 10*3/uL (ref 4.0–10.5)
nRBC: 0 % (ref 0.0–0.2)

## 2022-06-17 LAB — COMPREHENSIVE METABOLIC PANEL
ALT: 13 U/L (ref 0–44)
AST: 17 U/L (ref 15–41)
Albumin: 2.9 g/dL — ABNORMAL LOW (ref 3.5–5.0)
Alkaline Phosphatase: 88 U/L (ref 38–126)
Anion gap: 13 (ref 5–15)
BUN: 7 mg/dL — ABNORMAL LOW (ref 8–23)
CO2: 24 mmol/L (ref 22–32)
Calcium: 8.8 mg/dL — ABNORMAL LOW (ref 8.9–10.3)
Chloride: 99 mmol/L (ref 98–111)
Creatinine, Ser: 1.09 mg/dL (ref 0.61–1.24)
GFR, Estimated: >60 mL/min (ref >60)
Glucose, Bld: 543 mg/dL (ref 70–99)
Potassium: 4.2 mmol/L (ref 3.5–5.1)
Sodium: 136 mmol/L (ref 135–145)
Total Bilirubin: 0.5 mg/dL (ref 0.3–1.2)
Total Protein: 6.8 g/dL (ref 6.5–8.1)

## 2022-06-17 LAB — SALICYLATE LEVEL: Salicylate Lvl: <7 mg/dL — ABNORMAL LOW (ref 7.0–30.0)

## 2022-06-17 LAB — RAPID URINE DRUG SCREEN, HOSP PERFORMED
Amphetamines: NOT DETECTED
Barbiturates: NOT DETECTED
Benzodiazepines: NOT DETECTED
Cocaine: POSITIVE — AB
Opiates: NOT DETECTED
Tetrahydrocannabinol: NOT DETECTED

## 2022-06-17 LAB — ETHANOL: Alcohol, Ethyl (B): <10 mg/dL (ref <10)

## 2022-06-17 LAB — ACETAMINOPHEN LEVEL: Acetaminophen (Tylenol), Serum: <10 ug/mL — ABNORMAL LOW (ref 10–30)

## 2022-06-17 LAB — CBG MONITORING, ED
Glucose-Capillary: 463 mg/dL — ABNORMAL HIGH (ref 70–99)
Glucose-Capillary: 308 mg/dL — ABNORMAL HIGH (ref 70–99)

## 2022-06-17 MED ORDER — INSULIN GLARGINE-YFGN 100 UNIT/ML ~~LOC~~ SOLN
30.0000 [IU] | Freq: Once | SUBCUTANEOUS | Status: AC
  Administered 2022-06-17: 30 [IU] via SUBCUTANEOUS
  Filled 2022-06-17: qty 0.3

## 2022-06-17 MED ORDER — INSULIN GLARGINE-YFGN 100 UNIT/ML ~~LOC~~ SOLN
30.0000 [IU] | Freq: Every day | SUBCUTANEOUS | Status: DC
  Filled 2022-06-17: qty 0.3

## 2022-06-17 MED ORDER — TRAZODONE HCL 100 MG PO TABS
100.0000 mg | ORAL_TABLET | Freq: Every evening | ORAL | Status: DC | PRN
  Administered 2022-06-18: 100 mg via ORAL
  Filled 2022-06-17 (×2): qty 1

## 2022-06-17 MED ORDER — INSULIN ASPART 100 UNIT/ML IJ SOLN
0.0000 [IU] | INTRAMUSCULAR | Status: DC
  Administered 2022-06-18: 11 [IU] via SUBCUTANEOUS

## 2022-06-17 MED ORDER — CYCLOBENZAPRINE HCL 10 MG PO TABS
10.0000 mg | ORAL_TABLET | Freq: Three times a day (TID) | ORAL | Status: DC
  Administered 2022-06-17 – 2022-06-18 (×2): 10 mg via ORAL
  Filled 2022-06-17 (×2): qty 1

## 2022-06-17 MED ORDER — ATORVASTATIN CALCIUM 40 MG PO TABS
40.0000 mg | ORAL_TABLET | Freq: Every day | ORAL | Status: DC
  Administered 2022-06-17: 40 mg via ORAL
  Filled 2022-06-17: qty 1

## 2022-06-17 MED ORDER — INSULIN ASPART PROT & ASPART (70-30 MIX) 100 UNIT/ML ~~LOC~~ SUSP: 2.0000 [IU] | Freq: Two times a day (BID) | SUBCUTANEOUS | Status: DC

## 2022-06-17 MED ORDER — METFORMIN HCL 500 MG PO TABS
1000.0000 mg | ORAL_TABLET | Freq: Two times a day (BID) | ORAL | Status: DC
  Administered 2022-06-17 – 2022-06-18 (×2): 1000 mg via ORAL
  Filled 2022-06-17 (×2): qty 2

## 2022-06-17 MED ORDER — INSULIN ASPART 100 UNIT/ML IJ SOLN
7.0000 [IU] | Freq: Once | INTRAMUSCULAR | Status: AC
  Administered 2022-06-17: 7 [IU] via SUBCUTANEOUS

## 2022-06-17 MED ORDER — AMLODIPINE BESYLATE 5 MG PO TABS
10.0000 mg | ORAL_TABLET | Freq: Every day | ORAL | Status: DC
  Administered 2022-06-17 – 2022-06-18 (×2): 10 mg via ORAL
  Filled 2022-06-17 (×2): qty 2

## 2022-06-17 MED ORDER — FAMOTIDINE 20 MG PO TABS
10.0000 mg | ORAL_TABLET | Freq: Two times a day (BID) | ORAL | Status: DC
  Administered 2022-06-17 – 2022-06-18 (×2): 10 mg via ORAL
  Filled 2022-06-17 (×2): qty 1

## 2022-06-17 MED ORDER — TAMSULOSIN HCL 0.4 MG PO CAPS
0.4000 mg | ORAL_CAPSULE | Freq: Every day | ORAL | Status: DC
  Administered 2022-06-17: 0.4 mg via ORAL
  Filled 2022-06-17: qty 1

## 2022-06-17 MED ORDER — GABAPENTIN 300 MG PO CAPS
600.0000 mg | ORAL_CAPSULE | Freq: Three times a day (TID) | ORAL | Status: DC
  Administered 2022-06-17 – 2022-06-18 (×2): 600 mg via ORAL
  Filled 2022-06-17 (×2): qty 2

## 2022-06-17 MED ORDER — GLIPIZIDE ER 10 MG PO TB24
10.0000 mg | ORAL_TABLET | Freq: Every day | ORAL | Status: DC
  Administered 2022-06-18: 10 mg via ORAL
  Filled 2022-06-17 (×3): qty 1

## 2022-06-17 NOTE — ED Notes (Signed)
Lunch pack and water given
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
## 2022-06-17 NOTE — ED Notes (Signed)
TTS performed 

## 2022-06-17 NOTE — ED Notes (Addendum)
Critical BG 463 relayed to MD and PA.

## 2022-06-18 ENCOUNTER — Ambulatory Visit (HOSPITAL_COMMUNITY)
Admission: EM | Admit: 2022-06-18 | Discharge: 2022-06-18 | Disposition: A | Discharge status: Home / Self Care | Payer: Medicaid Other | Attending: Registered Nurse | Admitting: Registered Nurse

## 2022-06-18 ENCOUNTER — Encounter (HOSPITAL_COMMUNITY): Payer: Self-pay | Admitting: Registered Nurse

## 2022-06-18 DIAGNOSIS — Z5900 Homelessness unspecified: Secondary | ICD-10-CM

## 2022-06-18 DIAGNOSIS — F1420 Cocaine dependence, uncomplicated: Secondary | ICD-10-CM | POA: Diagnosis present

## 2022-06-18 DIAGNOSIS — Z754 Unavailability and inaccessibility of other helping agencies: Secondary | ICD-10-CM

## 2022-06-18 DIAGNOSIS — F1994 Other psychoactive substance use, unspecified with psychoactive substance-induced mood disorder: Secondary | ICD-10-CM | POA: Diagnosis not present

## 2022-06-18 DIAGNOSIS — Z5982 Transportation insecurity: Secondary | ICD-10-CM | POA: Insufficient documentation

## 2022-06-18 DIAGNOSIS — Z5901 Sheltered homelessness: Secondary | ICD-10-CM

## 2022-06-18 LAB — CBG MONITORING, ED
Glucose-Capillary: 103 mg/dL — ABNORMAL HIGH (ref 70–99)
Glucose-Capillary: 190 mg/dL — ABNORMAL HIGH (ref 70–99)
Glucose-Capillary: 154 mg/dL — ABNORMAL HIGH (ref 70–99)

## 2022-06-18 MED ORDER — IBUPROFEN 400 MG PO TABS
600.0000 mg | ORAL_TABLET | Freq: Once | ORAL | Status: AC
  Administered 2022-06-18: 600 mg via ORAL
  Filled 2022-06-18: qty 1

## 2022-06-18 NOTE — ED Notes (Signed)
Pts belongings given to pt

## 2022-06-18 NOTE — Discharge Instructions (Signed)
  Outpatient psychiatric Services  Walk in hours for medication management Monday, Wednesday, Thursday, and Friday from 8:00 AM to 11:00 AM Recommend arriving by by 7:30 AM.  It is first come first serve.    Walk in hours for therapy intake Monday and Wednesday only 8:00 AM to 11:00 AM Encouraged to arrive by 7:30 AM.  It is first come first serve   Inpatient patient psychiatric services [facility] offers comprehensive behavioral heath care services for mental health and substance abuse treatment.  Social work can also assist with referral to or getting you into a rehabilitation program short or long term         [facility]  Hours Monday - Friday: Services: 8:00AM - 3:00PM Offices: 8:00AM - 5:00PM  Physical Address [address] [place], [place] [address]   Please use this address for [facility] Mailing Address [address] [address] [place], [place] [address]  The [facility] helps people reconnect This is a safe place to rest, take care of basic needs and access the services and community that make all the difference. Our guests come to the IRC to take a class, do laundry, meet with a case manager or to get their mail. Sometimes they just need to sit in our dayroom and enjoy a conversation.  Here you will find everything from shower facilities to a computer lab, a mail room, classrooms and meeting spaces.  The [facility] helps people reconnect with their own lives and with the community at large.  A caring community setting One of the most exciting aspects of the IRC is that so many individuals and organizations in the community are a part of the everyday experience. Whether it's a hair stylist or law firm offering services right in-house, our partners make the IRC a truly interactive resource center where services are brought to our guests. The IRC brings together a comprehensive community of talented people who not only want to help solve problems, but also to be a  part of our guests' lives.  Integrated Care We take a person-centered approach to assistance that includes: Case management [facility] Mental health nurse Referrals  Fundamental Services We start with necessities: Showers and hygiene supplies Laundry Phone access Mailing addresses and mailboxes Replacement IDs Onsite barbershop Storage lockers [place] winter warming center  Self-Sufficiency We connect our guests with: Skilled trade classes Job skills classes Resume and jobs application assistance Interview training GED classes Professional clothing vouchers Financial literacy  
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
## 2022-06-18 NOTE — ED Notes (Signed)
Pt's lunch has arrived, pt sitting up and eating

## 2022-06-18 NOTE — ED Notes (Signed)
This RN reviewed discharge instructions with patient. He verbalized understanding and is unhappy with being discharged. PT well appearing upon discharge and denies pain. Pt ambulated with personal walker to exit. Pt give bus pass.

## 2022-06-18 NOTE — ED Provider Notes (Signed)
Patient cleared by psychiatry.  Reevaluated is calm and cooperative.  Reports no complaints at this time.  No SI, HI, or AVH currently.  Will have him follow-up with psychiatry as an outpatient as well as his primary doctor.   Rondel Baton, MD 06/18/22 1248
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
## 2022-06-18 NOTE — ED Notes (Signed)
Pt is resting on gurney at this time, respirations are spontaneous, even, unlabored and symmetrical bilaterally. Pt skin tone is appropriate for ethnicity, dry and warm. Sitter at bedside.

## 2022-06-18 NOTE — Discharge Instructions (Addendum)
[facility] Department of Social Services Address: [facility]  [address] [place], [place] [address] Go to window # 13:  Ask for the social worker on call.  Can do intake, assist with housing and transportation.   [facility]: Outpatient psychiatric Services  New Patient Assessment and Therapy Walk-in Monday thru Thursday 8:00 am first come first serve until slots are full Every Friday from 1:00 pm to 4:00 pm first come first serve until slots are full  New Patient Psychiatric Medication Management Monday thru Friday from 8:00 am to 11:00 am first come first served until slots are full  For all walk-ins we ask that you arrive by 7:15 am because patients will be seen in there order of arrival.   Availability is limited, and therefore you may not be seen on the same day that you walk in.  Our goal is to serve and meet the needs of our community to the best of our ability.    [facility] Address: [address] [place], [place], [place] [address] Phone: [phone]  Supported Employment The supported employment program is a person-centered, individualized, evidence-based support service that helps members choose, acquire, and maintain competitive employment in our community. This service supports the varying needs of individuals and promotes community inclusion and employment success. Members enrolled in the supported employment program can expect the following:  Development of an individual career plan Community based job placement Job shadowing Job development On-site job Furniture conservator/restorer and support  Supported Education Supported education helps our members receive the education and training they need to achieve their learning and recovery goals. This will assist members with becoming gainfully employed in the job or career of their choice. The program includes assistance with: Registering for disability accommodations Enrolling in  school and registering for classes Learning communication skills Scheduling tutoring sessions within your school [facility] partners with Vocational Rehabilitation to help increase the success of clients seeking employment and educational goals.  Want to learn more about our programs?   Please contact our intake department INTAKE: [phone] Ext [phone]  Mailing: [address] [address]   [place], [place] [address]   [email or web address]           [facility] Center  Hours Monday - Friday: Services: 8:00AM - 3:00PM Offices: 8:00AM - 5:00PM  Physical Address [address] [place], [place] [address]   Please use this address for [facility] Mailing Address [address] [address] [place], [place] [address]  [facility] helps people reconnect This is a safe place to rest, take care of basic needs and access the services and community that make all the difference. Our guests come to the [facility] to take a class, do laundry, meet with a case manager or to get their mail. Sometimes they just need to sit in our dayroom and enjoy a conversation.  Here you will find everything from shower facilities to a computer lab, a mail room, classrooms and meeting spaces.  The IRC helps people reconnect with their own lives and with the community at large.  A caring community setting One of the most exciting aspects of the IRC is that so many individuals and organizations in the community are a part of the everyday experience. Whether it's a hair stylist or law firm offering services right in-house, our partners make the [facility] a truly interactive resource center where services are brought to our guests. The IRC brings together a comprehensive community of talented people who not only want to  help solve problems, but also to be a part of our guests' lives.  Integrated Care We take a person-centered approach to assistance that includes: Case management Geneticist, molecular Medical clinic Mental health  nurse Referrals  Fundamental Services We start with necessities: Midwife and Armed forces operational officer addresses and mailboxes Replacement IDs Onsite barbershop Storage lockers [place] winter warming center  Self-Sufficiency We connect our guests with: Skilled trade classes Job skills classes Resume and jobs application assistance Interview training GED Academic librarian  Substance Abuse Treatment Programs  Intensive Outpatient Programs [facility]     [address]      [place], [place]                   [phone]       [facility] [address] [place] [address] [place], [place] [phone]  [facility]     (Inpatient and outpatient)     [address]           [phone]    [facility] [phone] (Suboxone and Methadone)  [address]      [place], [place] [address]      [phone]       [address] [place], [place] [phone]  [facility] (Outpatient/Inpatient, Chemical)    (insurance only) [phone]             [facility] (Groups & Residential) [place], [place] [phone]     [facility]     [address]     [place], [place]      [phone]       [facility] (for caregivers and family) [phone] [address] [place], [place] [phone]      Residential Treatment Programs [facility]      [address], [place], [place] [address]  [phone]       [facility] [address] [place], [place] [address] [phone]  [facility] [place]        [phone]       [facility] [phone]  [facility] ([facility]             [address]                                         [place], [place]                                                [phone] or [phone]                               [facility]  [place] [address] [place], [address] [phone]  [facility]    [address]      [place], [place]     [phone]       [facility] [address] [place], [place] [phone]  [facility]   [address] [place], [place] [address]     [phone]      Admissions: 8am-3pm M-F  Residential  Treatment Services (RTS) [address] [place], [place] [phone]  [facility] Program: Residential Program ([address]   [place], [place]      [phone] or [phone]     [facility]: [facility] [place], [place] (Walk in Hours over the weekend or by referral)  [facility] [address] [place], [place], [place] [address] [phone]  Crisis Mobile: Therapeutic Alternatives:  [phone] (for crisis response 24 hours a day) [facility] Hotline:      [phone] Outpatient Psychiatry and Counseling  [facility]: Mobile Crisis Management 24 hours:  [phone]  [facility] of the [employer] sliding scale fee and walk in schedule: M-F 8am-12pm/1pm-3pm [address]  [place], [place] [address] [phone]  [facility] [address] [place], [place] [address] [phone]  [facility] (Formerly known as [facility] [employer])- new patient walk-in appointments available Monday - Friday 8am -3pm.          [address] [place], [place] [address] [phone] or crisis line- [phone]  [facility] Outpatient Services/ Intensive Outpatient Therapy Program [address] [place], [place] [address] [phone]  [facility]                  Crisis Services      [phone] [address]     [place], [place] [address]                 [facility]   [facility] [phone]. [address] [place], [place] [address]   [employer] of  Care          [address]  [place], [place] [address]       [phone]  [facility] [address] [place], [place] [address] [phone]  [facility]    [address]    [place], [place] [address]     [phone]       Andee Poles, MD     [address]     [place] [address]     [phone]       [facility] [address] [place] [address]  [facility]     [address] [place], [place]      [phone]       [facility] Eulogio Ditch, MD [address] [place], [place] [address] [phone]  [facility]     [address]     [place], [place] [address]     [phone]       [facility] [address] [place], [place] [address] [phone] Resources for Temporary Residential Assistance/Crisis Centers  DAY CENTERS [facility]) M-F 8am-3pm   [address] [place], [place] [address]   [phone] Services include: laundry, barbering, support groups, case management, phone  & computer access, showers, AA/NA mtgs, mental health/substance abuse nurse, job skills class, disability information, VA assistance, spiritual classes, etc.   HOMELESS SHELTERS  [facility]     [facility]   [address], [place]     [phone]              [employer] (women and children)       [address] [place], [place] [address] [phone] [email or web address] for application  and process Application Required  [facility]   [address]    [place] [id number]     [phone]                    [facility] [address] [place], [place] [id number] [phone](schedule application appt.) Application Required  [facility] (women only)    [address]     [place], [place] [address]     [phone]      Intake starts 6pm daily Need valid ID, SSC, & Police report [facility] [place] [address] [place], [place] [phone] Application Required  [facility] (men only)     [address]      [place], [place]     [phone]       [facility] (Pregnant women only) [address] [place], [place] [phone]  [facility]      [address]      [place], [place] [address]     [phone]             [facility] [address] [place], [place] [phone] 90 day commitment/SA/Application process  [facility](men only)     [address]     [place], [place]     [phone]       Check-in at [facility]7pm            Crisis [facility] [address] [place], [place] [phone] Men/Women/Women and Children must be there by 7 pm  [facility] [place], [place] [phone]

## 2022-06-18 NOTE — ED Provider Notes (Signed)
Emergency Medicine Observation Re-evaluation Note  David Wyatt is a 62 y.o. male with hx of depression, seen on rounds today.  Pt initially presented to the ED for complaints of Depression Currently, the patient is not having any acute complaints.  Physical Exam  BP 122/73 (BP Location: Right Arm)   Pulse 65   Temp 98.5 F (36.9 C) (Oral)   Resp 16   SpO2 96%  Physical Exam General: Awake eating breakfast Lungs: Normal work of breathing Psych: Calm and cooperative  ED Course / MDM  EKG:   I have reviewed the labs performed to date as well as medications administered while in observation.  Recent changes in the last 24 hours include seen by TTS overnight who recommended overnight observation and reassessment this morning.  Plan  Current plan is for reassessment by TTS this morning.    Rondel Baton, MD 06/18/22 [phone]

## 2022-06-18 NOTE — ED Notes (Signed)
NAD noted, respirations are equal bilaterally and unlabored at this time. Pt resting in gurney and denies any unmet needs. Sitter at bedside.  

## 2022-06-18 NOTE — BH Assessment (Addendum)
Comprehensive Clinical Assessment (CCA) Note  06/18/2022 David Wyatt [id number] Disposition: Patient care discussed with Sindy Guadeloupe, NP.  He recommended patient be observed overnight and reassessed by psychiatry on 04/09.  Patient disposition recommendation given to RN Burman Nieves.    Pt is difficult to understand at times.  He has good eye contact and is oriented x4.  Patient is not responding to internal stimuli nor does he evidence any delusional thought content.  Patient statements are goal oriented.    Pt has no current outpatient provider.   Chief Complaint:  Chief Complaint  Patient presents with   Depression   Visit Diagnosis: MDD recurrent, moderate; Cocaine use d/o moderate    CCA Screening, Triage and Referral (STR)  Patient Reported Information How did you hear about [place]? Self  What Is the Reason for Your Visit/Call Today? Patient came to [facility] today.  "I am getting tired of life."  He has been having suicidal thoughts with no plan.  He denies any previous attempt to kill himself.  He denies any HI or A/V hallucinations. At one point during assessment pt says he is not really suicidal.  He has been using crack cocaine 1-3 times a week.  Can't recall the last use.  Pt reports not being able to sleep well due to his back surgery in the past.  Pt appetite is up and down deu to his diabetes.  How Long Has This Been Causing You Problems? 1-6 months  What Do You Feel Would Help You the Most Today? Treatment for Depression or other mood problem; Alcohol or Drug Use Treatment   Have You Recently Had Any Thoughts About Hurting Yourself? Yes  Are You Planning to Commit Suicide/Harm Yourself At This time? No   Flowsheet Row ED from 06/17/2022 in [facility] Department at [facility] ED from 06/15/2022 in [facility] Emergency Department at [facility] ED from 06/12/2022 in [facility] Emergency Department at [facility]  C-SSRS RISK CATEGORY No  Risk No Risk No Risk       Have you Recently Had Thoughts About Hurting Someone Karolee Ohs? No  Are You Planning to Harm Someone at This Time? No  Explanation: Feels like giving up.  No HI and no SI plan.   Have You Used Any Alcohol or Drugs in the Past 24 Hours? Yes  What Did You Use and How Much? Pt is positive for cocaine.   Do You Currently Have a Therapist/Psychiatrist? No  Name of Therapist/Psychiatrist: Name of Therapist/Psychiatrist: None   Have You Been Recently Discharged From Any Office Practice or Programs? No  Explanation of Discharge From Practice/Program: No discharges except from [facility] on 02/06/22     CCA Screening Triage Referral Assessment Type of Contact: Tele-Assessment  Telemedicine Service Delivery:   Is this Initial or Reassessment? Is this Initial or Reassessment?: Initial Assessment  Date Telepsych consult ordered in CH[facility]:  Date Telepsych consult ordered in CHL: 06/17/22  Time Telepsych consult ordered in CHL:  Time Telepsych consult ordered in CHL: 1749  Location of Assessment: [facility] ED  Provider Location: [facility] Assessment Services   Collateral Involvement: None   Does Patient Have a Automotive engineer Guardian? No  Legal Guardian Contact Information: No legal guardian.  Copy of Legal Guardianship Form: -- (No legal guardian)  Legal Guardian Notified of Arrival: -- (No legal guardian)  Legal Guardian Notified of Pending Discharge: -- (No legal guardian)  If Minor and Not Living with Parent(s), Who  has Custody? Pt is an adult.  Is CPS involved or ever been involved? Never  Is APS involved or ever been involved? Never   Patient Determined To Be At Risk for Harm To Self or Others Based on Review of Patient Reported Information or Presenting Complaint? No (Pt said he was not really suicidal.)  Method: No Plan (Pt denies having a suicide plan.  No HI.)  Availability of Means: No access or NA (No suicide plan or any HI.)  Intent: Vague  intent or NA (Pt presents with no suicide plan and no HI.)  Notification Required: No need or identified person  Additional Information for Danger to Others Potential: -- (Denies any HI.)  Additional Comments for Danger to Others Potential: Pt denies homicidal ideation.  Are There Guns or Other Weapons in Your Home? No  Types of Guns/Weapons: None  Are These Weapons Safely Secured?                            No  Who Could Verify You Are Able To Have These Secured: NA  Do You Have any Outstanding Charges, Pending Court Dates, Parole/Probation? Pt has a court date in June '24.  Contacted To Inform of Risk of Harm To Self or Others: Other: Comment (Pt denies any HI and no suicide plan.)    Does Patient Present under Involuntary Commitment? No    [place]   Patient Currently Receiving the Following Services: Not Receiving Services   Determination of Need: Urgent (48 hours)   Options For Referral: Other: Comment (Pt to be observed overnight and seen by provider on 04/09.)     CCA Biopsychosocial Patient Reported Schizophrenia/Schizoaffective Diagnosis in Past: No   Strengths: Says he is motivated to change.   Mental Health Symptoms Depression:   Difficulty Concentrating; Hopelessness; Change in energy/activity   Duration of Depressive symptoms:  Duration of Depressive Symptoms: Greater than two weeks   Mania:   None   Anxiety:    Worrying; Tension; Difficulty concentrating   Psychosis:   None   Duration of Psychotic symptoms:    Trauma:   None   Obsessions:   None   Compulsions:   N/A   Inattention:   N/A   Hyperactivity/Impulsivity:   N/A   Oppositional/Defiant Behaviors:   N/A   Emotional Irregularity:   Chronic feelings of emptiness   Other Mood/Personality Symptoms:   None    Mental Status Exam Appearance and self-care  Stature:   Average   Weight:   Average weight    Clothing:   Casual (In scrubs.)   Grooming:   Normal   Cosmetic use:   None   Posture/gait:   Normal   Motor activity:   Not Remarkable   Sensorium  Attention:   Normal   Concentration:   Normal   Orientation:   X5   Recall/memory:   Normal   Affect and Mood  Affect:   Appropriate   Mood:   Hopeless   Relating  Eye contact:   Normal   Facial expression:   Responsive   Attitude toward examiner:   Cooperative   Thought and Language  Speech flow:  Clear and Coherent   Thought content:   Appropriate to Mood and Circumstances   Preoccupation:   None   Hallucinations:   None   Organization:   Coherent; Intact; Radiation protection practitionerGoal-directed   Executive Functions  Fund of Knowledge:   Average   Intelligence:   Average   Abstraction:   Normal   Judgement:   Good   Reality Testing:   Realistic   Insight:   Fair   Decision Making:   Normal   Social Functioning  Social Maturity:   Responsible; Isolates   Social Judgement:   Normal   Stress  Stressors:   Housing; Surveyor, quantity; Transitions   Coping Ability:   Deficient supports; Overwhelmed   Skill Deficits:   None   Supports:   Support needed     Religion: Religion/Spirituality Are You A Religious Person?: Yes What is Your Religious Affiliation?: None How Might This Affect Treatment?: Does not affect treatment  Leisure/Recreation: Leisure / Recreation Do You Have Hobbies?: No  Exercise/Diet: Exercise/Diet Do You Exercise?: No Have You Gained or Lost A Significant Amount of Weight in the Past Six Months?: No Do You Follow a Special Diet?: Yes Type of Diet: Diabetic Do You Have Any Trouble Sleeping?: No   CCA Employment/Education Employment/Work Situation:    Education: Education Is Patient Currently Attending School?: No Last Grade Completed: 12 Did You Attend College?: No Did You Have An Individualized Education Program (IIEP): No Did You Have Any Difficulty At  School?: No Patient's Education Has Been Impacted by Current Illness: No   CCA Family/Childhood History Family and Relationship History: Family history Marital status: Single Does patient have children?: Yes How many children?: 3 How is patient's relationship with their children?: Hasn't had contact with his children, who live in [place], for "years"  Childhood History:  Childhood History By whom was/is the patient raised?: Both parents Did patient suffer any verbal/emotional/physical/sexual abuse as a child?: No Did patient suffer from severe childhood neglect?: No Has patient ever been sexually abused/assaulted/raped as an adolescent or adult?: No Was the patient ever a victim of a crime or a disaster?: No Witnessed domestic violence?: No Has patient been affected by domestic violence as an adult?: No       CCA Substance Use Alcohol/Drug Use: Alcohol / Drug Use Pain Medications: See MAR Prescriptions: See MAR Over the Counter: See MAR History of alcohol / drug use?: Yes Longest period of sobriety (when/how long): Hx of crack cocaine use - clean 2-3 months now Negative Consequences of Use: Financial Withdrawal Symptoms: None Substance #1 Name of Substance 1: Cocaine (crack) 1 - Age of First Use: 62 years of age 70 - Amount (size/oz): $50 worth 1 - Frequency: 1-2 times in a week 1 - Duration: ongoing 1 - Last Use / Amount: Cannot recall 1 - Method of Aquiring: illegal purchase 1- Route of Use: smoking                       ASAM's:  Six Dimensions of Multidimensional Assessment  Dimension 1:  Acute Intoxication and/or Withdrawal Potential:   Dimension 1:  Description of individual's past and current experiences of substance use and withdrawal: Pt reports history of crack use. Has not used in 1 month  Dimension 2:  Biomedical Conditions and Complications:   Dimension 2:  Description of patient's biomedical conditions and  complications: Diabetes, HTN, multiple  medical conditions  Dimension 3:  Emotional, Behavioral, or Cognitive Conditions and Complications:  Dimension 3:  Description of emotional, behavioral, or cognitive conditions and complications: Pt reports depressive symptoms  Dimension 4:  Readiness to Change:  Dimension 4:  Description of Readiness to Change criteria: Pt is motivated for treatment  Dimension  5:  Relapse, Continued use, or Continued Problem Potential:  Dimension 5:  Relapse, continued use, or continued problem potential critiera description: Pt has used cocaine in the last 24 hours  Dimension 6:  Recovery/Living Environment:  Dimension 6:  Recovery/Iiving environment criteria description: Unsheltered homeless  ASAM Severity Score: ASAM's Severity Rating Score: 10  ASAM Recommended Level of Treatment: ASAM Recommended Level of Treatment: Level III Residential Treatment   Substance use Disorder (SUD) Substance Use Disorder (SUD)  Checklist Symptoms of Substance Use: Persistent desire or unsuccessful efforts to cut down or control use  Recommendations for Services/Supports/Treatments: Recommendations for Services/Supports/Treatments Recommendations For Services/Supports/Treatments: Other (Comment) (Observe in ED overnight.)  Discharge Disposition:    DSM5 Diagnoses: Patient Active Problem List   Diagnosis Date Noted   Cocaine use disorder 03/03/2022   Cocaine use disorder, severe, dependence 01/21/2022   Passive suicidal ideations 01/20/2022   Acute respiratory failure with hypoxia 10/13/2021   History of cocaine use 07/23/2021   Chronic right shoulder pain 07/23/2021   Renal stones 07/23/2021   Dermoid cyst of skin of back 07/23/2021   Uncontrolled type 2 diabetes mellitus with hyperglycemia 03/22/2021   Left kidney mass 03/22/2021   Hydronephrosis of right kidney 02/23/2021   Tobacco abuse 08/19/2018   Hyperlipidemia 04/24/2017   GERD (gastroesophageal reflux disease) 06/13/2015   Diabetic neuropathy 05/01/2015    Essential hypertension, benign 11/01/2013   Lumbar disc herniation 09/13/2013   Unspecified constipation 09/13/2013   Chronic back pain 09/13/2013   Felon of finger of left hand with lymphangitis 12/13/2012   Crack cocaine use 07/29/2012   Arthritis 07/29/2012   Weakness generalized 07/29/2012     Referrals to Alternative Service(s): Referred to Alternative Service(s):   Place:   Date:   Time:    Referred to Alternative Service(s):   Place:   Date:   Time:    Referred to Alternative Service(s):   Place:   Date:   Time:    Referred to Alternative Service(s):   Place:   Date:   Time:     Wandra Mannan

## 2022-06-18 NOTE — ED Notes (Signed)
Psych NP at bedside

## 2022-06-18 NOTE — ED Notes (Signed)
Proivder at bedside.

## 2022-06-18 NOTE — ED Notes (Addendum)
3435: Pt's breakfast has arrived, pt has been stating that "I don't feel well. My body doesn't feel right". RN notified about pt's complaints.   [phone]: Pt kept falling asleep despite voicing hunger. Writer sat pt up and help feed pt due to pt having tremors while eating, which resulted to pt failing in eating his breakfast. Pt only ate bacon and coffee. Writer provided pt with graham crackers, peanut butter and apple juice. Pt told that this would be his snack. RN notified of pt

## 2022-06-18 NOTE — ED Notes (Signed)
Psychiatry talking to pt 

## 2022-06-19 ENCOUNTER — Ambulatory Visit (HOSPITAL_COMMUNITY)
Admission: EM | Admit: 2022-06-19 | Discharge: 2022-06-19 | Disposition: A | Discharge status: Other Acute Inpatient Hospital | Payer: Medicaid Other | Attending: Family | Admitting: Family

## 2022-06-19 ENCOUNTER — Other Ambulatory Visit: Payer: Self-pay

## 2022-06-19 ENCOUNTER — Ambulatory Visit: Payer: Self-pay | Admitting: *Deleted

## 2022-06-19 ENCOUNTER — Inpatient Hospital Stay (HOSPITAL_COMMUNITY)
Admission: AD | Admit: 2022-06-19 | Discharge: 2022-06-26 | DRG: 885 | Disposition: A | Discharge status: Home / Self Care | Payer: Medicaid Other | Source: Intra-hospital | Attending: Psychiatry | Admitting: Psychiatry

## 2022-06-19 DIAGNOSIS — Z5982 Transportation insecurity: Secondary | ICD-10-CM

## 2022-06-19 DIAGNOSIS — F17210 Nicotine dependence, cigarettes, uncomplicated: Secondary | ICD-10-CM | POA: Diagnosis present

## 2022-06-19 DIAGNOSIS — F1410 Cocaine abuse, uncomplicated: Secondary | ICD-10-CM | POA: Diagnosis present

## 2022-06-19 DIAGNOSIS — Z5941 Food insecurity: Secondary | ICD-10-CM | POA: Diagnosis not present

## 2022-06-19 DIAGNOSIS — Z5900 Homelessness unspecified: Secondary | ICD-10-CM | POA: Insufficient documentation

## 2022-06-19 DIAGNOSIS — E1165 Type 2 diabetes mellitus with hyperglycemia: Secondary | ICD-10-CM | POA: Diagnosis present

## 2022-06-19 DIAGNOSIS — E7800 Pure hypercholesterolemia, unspecified: Secondary | ICD-10-CM | POA: Diagnosis present

## 2022-06-19 DIAGNOSIS — Z5901 Sheltered homelessness: Secondary | ICD-10-CM

## 2022-06-19 DIAGNOSIS — Z79899 Other long term (current) drug therapy: Secondary | ICD-10-CM

## 2022-06-19 DIAGNOSIS — Z833 Family history of diabetes mellitus: Secondary | ICD-10-CM

## 2022-06-19 DIAGNOSIS — Z83438 Family history of other disorder of lipoprotein metabolism and other lipidemia: Secondary | ICD-10-CM

## 2022-06-19 DIAGNOSIS — M549 Dorsalgia, unspecified: Secondary | ICD-10-CM | POA: Diagnosis present

## 2022-06-19 DIAGNOSIS — G8929 Other chronic pain: Secondary | ICD-10-CM | POA: Diagnosis present

## 2022-06-19 DIAGNOSIS — Z91148 Patient's other noncompliance with medication regimen for other reason: Secondary | ICD-10-CM

## 2022-06-19 DIAGNOSIS — G4700 Insomnia, unspecified: Secondary | ICD-10-CM | POA: Diagnosis present

## 2022-06-19 DIAGNOSIS — Z841 Family history of disorders of kidney and ureter: Secondary | ICD-10-CM | POA: Diagnosis not present

## 2022-06-19 DIAGNOSIS — Z888 Allergy status to other drugs, medicaments and biological substances status: Secondary | ICD-10-CM

## 2022-06-19 DIAGNOSIS — F332 Major depressive disorder, recurrent severe without psychotic features: Principal | ICD-10-CM | POA: Diagnosis present

## 2022-06-19 DIAGNOSIS — E1140 Type 2 diabetes mellitus with diabetic neuropathy, unspecified: Secondary | ICD-10-CM | POA: Diagnosis present

## 2022-06-19 DIAGNOSIS — R45851 Suicidal ideations: Secondary | ICD-10-CM | POA: Diagnosis present

## 2022-06-19 DIAGNOSIS — F1010 Alcohol abuse, uncomplicated: Secondary | ICD-10-CM | POA: Diagnosis present

## 2022-06-19 DIAGNOSIS — Z1152 Encounter for screening for COVID-19: Secondary | ICD-10-CM | POA: Insufficient documentation

## 2022-06-19 DIAGNOSIS — R1110 Vomiting, unspecified: Principal | ICD-10-CM

## 2022-06-19 DIAGNOSIS — I10 Essential (primary) hypertension: Secondary | ICD-10-CM | POA: Diagnosis present

## 2022-06-19 LAB — CBC WITH DIFFERENTIAL/PLATELET
Abs Immature Granulocytes: 0.04 10*3/uL (ref 0.00–0.07)
Basophils Absolute: 0 10*3/uL (ref 0.0–0.1)
Basophils Relative: 1 %
Eosinophils Absolute: 0.1 10*3/uL (ref 0.0–0.5)
Eosinophils Relative: 2 %
HCT: 38.2 % — ABNORMAL LOW (ref 39.0–52.0)
Hemoglobin: 12.2 g/dL — ABNORMAL LOW (ref 13.0–17.0)
Immature Granulocytes: 1 %
Lymphocytes Relative: 21 %
Lymphs Abs: 1.4 10*3/uL (ref 0.7–4.0)
MCH: 30.4 pg (ref 26.0–34.0)
MCHC: 31.9 g/dL (ref 30.0–36.0)
MCV: 95.3 fL (ref 80.0–100.0)
Monocytes Absolute: 0.7 10*3/uL (ref 0.1–1.0)
Monocytes Relative: 11 %
Neutro Abs: 4.2 10*3/uL (ref 1.7–7.7)
Neutrophils Relative %: 64 %
Platelets: 417 10*3/uL — ABNORMAL HIGH (ref 150–400)
RBC: 4.01 MIL/uL — ABNORMAL LOW (ref 4.22–5.81)
RDW: 13.6 % (ref 11.5–15.5)
WBC: 6.4 10*3/uL (ref 4.0–10.5)
nRBC: 0 % (ref 0.0–0.2)

## 2022-06-19 LAB — COMPREHENSIVE METABOLIC PANEL
ALT: 10 U/L (ref 0–44)
AST: 11 U/L — ABNORMAL LOW (ref 15–41)
Albumin: 2.9 g/dL — ABNORMAL LOW (ref 3.5–5.0)
Alkaline Phosphatase: 80 U/L (ref 38–126)
Anion gap: 10 (ref 5–15)
BUN: 12 mg/dL (ref 8–23)
CO2: 25 mmol/L (ref 22–32)
Calcium: 8.8 mg/dL — ABNORMAL LOW (ref 8.9–10.3)
Chloride: 100 mmol/L (ref 98–111)
Creatinine, Ser: 1.04 mg/dL (ref 0.61–1.24)
GFR, Estimated: >60 mL/min (ref >60)
Glucose, Bld: 404 mg/dL — ABNORMAL HIGH (ref 70–99)
Potassium: 4 mmol/L (ref 3.5–5.1)
Sodium: 135 mmol/L (ref 135–145)
Total Bilirubin: 0.6 mg/dL (ref 0.3–1.2)
Total Protein: 6.8 g/dL (ref 6.5–8.1)

## 2022-06-19 LAB — POCT URINE DRUG SCREEN - MANUAL ENTRY (I-SCREEN)
POC Amphetamine UR: NOT DETECTED
POC Buprenorphine (BUP): NOT DETECTED
POC Cocaine UR: POSITIVE — AB
POC Marijuana UR: NOT DETECTED
POC Methadone UR: NOT DETECTED
POC Methamphetamine UR: NOT DETECTED
POC Morphine: NOT DETECTED
POC Oxazepam (BZO): NOT DETECTED
POC Oxycodone UR: NOT DETECTED
POC Secobarbital (BAR): NOT DETECTED

## 2022-06-19 LAB — ETHANOL: Alcohol, Ethyl (B): <10 mg/dL (ref <10)

## 2022-06-19 LAB — GLUCOSE, CAPILLARY: Glucose-Capillary: 115 mg/dL — ABNORMAL HIGH (ref 70–99)

## 2022-06-19 LAB — POC SARS CORONAVIRUS 2 AG: SARSCOV2ONAVIRUS 2 AG: NEGATIVE

## 2022-06-19 LAB — SARS CORONAVIRUS 2 BY RT PCR: SARS Coronavirus 2 by RT PCR: NEGATIVE

## 2022-06-19 LAB — MAGNESIUM: Magnesium: 1.7 mg/dL (ref 1.7–2.4)

## 2022-06-19 LAB — TSH: TSH: 0.106 u[IU]/mL — ABNORMAL LOW (ref 0.350–4.500)

## 2022-06-19 MED ORDER — ACETAMINOPHEN 325 MG PO TABS
650.0000 mg | ORAL_TABLET | Freq: Four times a day (QID) | ORAL | Status: DC | PRN
  Administered 2022-06-21 – 2022-06-23 (×3): 650 mg via ORAL
  Filled 2022-06-19 (×3): qty 2

## 2022-06-19 MED ORDER — ATORVASTATIN CALCIUM 40 MG PO TABS
40.0000 mg | ORAL_TABLET | Freq: Every day | ORAL | Status: DC
  Administered 2022-06-20 – 2022-06-26 (×7): 40 mg via ORAL
  Filled 2022-06-19 (×10): qty 1

## 2022-06-19 MED ORDER — ALUM & MAG HYDROXIDE-SIMETH 200-200-20 MG/5ML PO SUSP: 30.0000 mL | ORAL | Status: DC | PRN

## 2022-06-19 MED ORDER — FAMOTIDINE 10 MG PO TABS
10.0000 mg | ORAL_TABLET | Freq: Two times a day (BID) | ORAL | Status: DC
  Administered 2022-06-19: 10 mg via ORAL
  Filled 2022-06-19: qty 1

## 2022-06-19 MED ORDER — DIPHENHYDRAMINE HCL 25 MG PO CAPS: 50.0000 mg | ORAL_CAPSULE | Freq: Three times a day (TID) | ORAL | Status: DC | PRN

## 2022-06-19 MED ORDER — HALOPERIDOL 5 MG PO TABS: 5.0000 mg | ORAL_TABLET | Freq: Three times a day (TID) | ORAL | Status: DC | PRN

## 2022-06-19 MED ORDER — ATORVASTATIN CALCIUM 40 MG PO TABS: 40.0000 mg | ORAL_TABLET | Freq: Every day | ORAL | Status: DC

## 2022-06-19 MED ORDER — ACETAMINOPHEN 325 MG PO TABS: 650.0000 mg | ORAL_TABLET | Freq: Four times a day (QID) | ORAL | Status: DC | PRN

## 2022-06-19 MED ORDER — INSULIN GLARGINE-YFGN 100 UNIT/ML ~~LOC~~ SOLN
30.0000 [IU] | Freq: Every day | SUBCUTANEOUS | Status: DC
  Administered 2022-06-20 – 2022-06-25 (×6): 30 [IU] via SUBCUTANEOUS
  Filled 2022-06-19: qty 0.3

## 2022-06-19 MED ORDER — DULOXETINE HCL 60 MG PO CPEP: 60.0000 mg | ORAL_CAPSULE | Freq: Every day | ORAL | Status: DC

## 2022-06-19 MED ORDER — GLIPIZIDE ER 10 MG PO TB24
10.0000 mg | ORAL_TABLET | Freq: Every day | ORAL | Status: DC
  Administered 2022-06-20 – 2022-06-26 (×7): 10 mg via ORAL
  Filled 2022-06-19 (×10): qty 1

## 2022-06-19 MED ORDER — HALOPERIDOL LACTATE 5 MG/ML IJ SOLN: 5.0000 mg | Freq: Three times a day (TID) | INTRAMUSCULAR | Status: DC | PRN

## 2022-06-19 MED ORDER — METFORMIN HCL 500 MG PO TABS: 1000.0000 mg | ORAL_TABLET | Freq: Two times a day (BID) | ORAL | Status: DC

## 2022-06-19 MED ORDER — TAMSULOSIN HCL 0.4 MG PO CAPS: 0.4000 mg | ORAL_CAPSULE | Freq: Every day | ORAL | Status: DC

## 2022-06-19 MED ORDER — AMLODIPINE BESYLATE 10 MG PO TABS: 10.0000 mg | ORAL_TABLET | Freq: Every day | ORAL | Status: DC

## 2022-06-19 MED ORDER — GABAPENTIN 300 MG PO CAPS
600.0000 mg | ORAL_CAPSULE | Freq: Three times a day (TID) | ORAL | Status: DC
  Administered 2022-06-20 – 2022-06-26 (×20): 600 mg via ORAL
  Filled 2022-06-19 (×29): qty 2

## 2022-06-19 MED ORDER — MAGNESIUM HYDROXIDE 400 MG/5ML PO SUSP: 30.0000 mL | Freq: Every day | ORAL | Status: DC | PRN

## 2022-06-19 MED ORDER — TAMSULOSIN HCL 0.4 MG PO CAPS
0.4000 mg | ORAL_CAPSULE | Freq: Every day | ORAL | Status: DC
  Administered 2022-06-20 – 2022-06-26 (×7): 0.4 mg via ORAL
  Filled 2022-06-19 (×10): qty 1

## 2022-06-19 MED ORDER — LORAZEPAM 1 MG PO TABS: 2.0000 mg | ORAL_TABLET | Freq: Three times a day (TID) | ORAL | Status: DC | PRN

## 2022-06-19 MED ORDER — TRAZODONE HCL 100 MG PO TABS: 100.0000 mg | ORAL_TABLET | Freq: Every evening | ORAL | Status: DC | PRN

## 2022-06-19 MED ORDER — GLIPIZIDE ER 2.5 MG PO TB24: 10.0000 mg | ORAL_TABLET | Freq: Every day | ORAL | Status: DC

## 2022-06-19 MED ORDER — GABAPENTIN 300 MG PO CAPS
600.0000 mg | ORAL_CAPSULE | Freq: Three times a day (TID) | ORAL | Status: DC
  Administered 2022-06-19: 600 mg via ORAL
  Filled 2022-06-19: qty 2

## 2022-06-19 MED ORDER — INSULIN ASPART 100 UNIT/ML IJ SOLN
0.0000 [IU] | Freq: Three times a day (TID) | INTRAMUSCULAR | Status: DC
  Administered 2022-06-20: 5 [IU] via SUBCUTANEOUS
  Administered 2022-06-20: 2 [IU] via SUBCUTANEOUS
  Administered 2022-06-20: 1 [IU] via SUBCUTANEOUS
  Administered 2022-06-21: 5 [IU] via SUBCUTANEOUS
  Administered 2022-06-21: 7 [IU] via SUBCUTANEOUS
  Administered 2022-06-22: 3 [IU] via SUBCUTANEOUS
  Administered 2022-06-22: 2 [IU] via SUBCUTANEOUS
  Administered 2022-06-22: 5 [IU] via SUBCUTANEOUS
  Administered 2022-06-24 – 2022-06-26 (×4): 2 [IU] via SUBCUTANEOUS
  Filled 2022-06-19: qty 0.09

## 2022-06-19 MED ORDER — INSULIN GLARGINE-YFGN 100 UNIT/ML ~~LOC~~ SOLN
30.0000 [IU] | Freq: Every day | SUBCUTANEOUS | Status: DC
  Administered 2022-06-19: 30 [IU] via SUBCUTANEOUS

## 2022-06-19 MED ORDER — INSULIN ASPART 100 UNIT/ML IJ SOLN
0.0000 [IU] | Freq: Three times a day (TID) | INTRAMUSCULAR | Status: DC
  Administered 2022-06-19: 9 [IU] via SUBCUTANEOUS

## 2022-06-19 MED ORDER — AMLODIPINE BESYLATE 10 MG PO TABS
10.0000 mg | ORAL_TABLET | Freq: Every day | ORAL | Status: DC
  Administered 2022-06-20 – 2022-06-26 (×6): 10 mg via ORAL
  Filled 2022-06-19 (×10): qty 1

## 2022-06-19 MED ORDER — FAMOTIDINE 10 MG PO TABS
10.0000 mg | ORAL_TABLET | Freq: Two times a day (BID) | ORAL | Status: DC
  Administered 2022-06-20 – 2022-06-26 (×13): 10 mg via ORAL
  Filled 2022-06-19 (×20): qty 1

## 2022-06-19 MED ORDER — DIPHENHYDRAMINE HCL 50 MG/ML IJ SOLN: 50.0000 mg | Freq: Three times a day (TID) | INTRAMUSCULAR | Status: DC | PRN

## 2022-06-19 MED ORDER — LORAZEPAM 2 MG/ML IJ SOLN: 2.0000 mg | Freq: Three times a day (TID) | INTRAMUSCULAR | Status: DC | PRN

## 2022-06-19 MED ORDER — METFORMIN HCL 500 MG PO TABS
1000.0000 mg | ORAL_TABLET | Freq: Two times a day (BID) | ORAL | Status: DC
  Administered 2022-06-20 – 2022-06-26 (×13): 1000 mg via ORAL
  Filled 2022-06-19 (×20): qty 2

## 2022-06-19 MED ORDER — TRAZODONE HCL 100 MG PO TABS
100.0000 mg | ORAL_TABLET | Freq: Every evening | ORAL | Status: DC | PRN
  Administered 2022-06-21 – 2022-06-25 (×5): 100 mg via ORAL
  Filled 2022-06-19 (×5): qty 1

## 2022-06-19 MED ORDER — DULOXETINE HCL 60 MG PO CPEP
60.0000 mg | ORAL_CAPSULE | Freq: Every day | ORAL | Status: DC
  Administered 2022-06-20 – 2022-06-26 (×7): 60 mg via ORAL
  Filled 2022-06-19 (×10): qty 1

## 2022-06-19 NOTE — ED Notes (Signed)
[facility] courier called to collect STAT specimens and to deliver to [facility]. Patient tolerated stick x1 to the R AC well.

## 2022-06-19 NOTE — Progress Notes (Signed)
   06/19/22 1628  [facility] Triage Screening (Walk-ins at [facility] only)  How Did You Hear About Us? Primary Care  What Is the Reason for Your Visit/Call Today? Pt called his PCP reporting SI denies plan to harm self. Patient main stressor is that he is homeless.  How Long Has This Been Causing You Problems? > than 6 months  Have You Recently Had Any Thoughts About Hurting Yourself? Yes  Are You Planning to Commit Suicide/Harm Yourself At This time? No  Have you Recently Had Thoughts About Hurting Someone Else? No  Are You Planning To Harm Someone At This Time? No  Explanation: NA  Are you currently experiencing any auditory, visual or other hallucinations? No  Have You Used Any Alcohol or Drugs in the Past 24 Hours? No  How long ago did you use Drugs or Alcohol? NA  What Did You Use and How Much? NA  Do you have any current medical co-morbidities that require immediate attention? No  Clinician description of patient physical appearance/behavior: IRRITATED  What Do You Feel Would Help You the Most Today? Treatment for Depression or other mood problem;Housing Assistance;Food Assistance  If access to [facility] Urgent Care was not available, would you have sought care in the Emergency Department? No  Determination of Need Routine (7 days)  Options For Referral Medication Management;Outpatient Therapy    

## 2022-06-19 NOTE — ED Notes (Signed)
Pt laying in bed calm and cooperative. No c/o pain or distress. Will continue to monitor for safety 

## 2022-06-19 NOTE — ED Notes (Signed)
CBG 384 

## 2022-06-19 NOTE — ED Notes (Signed)
Report given to [place] Rn @[facility]  adult unit
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
## 2022-06-19 NOTE — Progress Notes (Signed)
Pt is admitted to [facility] due to SI with no plan or intent. Pt currently denies and verbally contracts for safety on the unit. Pt is alert and oriented X3. Pt ambulates with a walker and is oriented to staff/unit. Pt was cooperative labs and skin assessment. Pt denies HI/AVH. Meal given. Staff will monitor for pt's safety.

## 2022-06-19 NOTE — Progress Notes (Deleted)
   06/19/22 1628  [facility] Triage Screening (Walk-ins at [facility] only)  How Did You Hear About [place]? Primary Care  What Is the Reason for Your Visit/Call Today? Pt called his PCP reporting SI denies plan to harm self. Patient main stressor is that he is homeless.  How Long Has This Been Causing You Problems? > than 6 months  Have You Recently Had Any Thoughts About Hurting Yourself? Yes  Are You Planning to Commit Suicide/Harm Yourself At This time? No  Have you Recently Had Thoughts About Hurting Someone Karolee Ohs? No  Are You Planning To Harm Someone At This Time? No  Explanation: NA  Are you currently experiencing any auditory, visual or other hallucinations? No  Have You Used Any Alcohol or Drugs in the Past 24 Hours? No  How long ago did you use Drugs or Alcohol? NA  What Did You Use and How Much? NA  Do you have any current medical co-morbidities that require immediate attention? No  Clinician description of patient physical appearance/behavior: IRRITATED  What Do You Feel Would Help You the Most Today? Treatment for Depression or other mood problem;Housing Assistance;Food Assistance  If access to [facility] Urgent Care was not available, would you have sought care in the Emergency Department? No  Determination of Need Routine (7 days)  Options For Referral Medication Management;Outpatient Therapy

## 2022-06-19 NOTE — ED Notes (Signed)
Faxed voluntary consent form and received a confirmation.

## 2022-06-19 NOTE — ED Notes (Signed)
CBG: 115 
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
## 2022-06-19 NOTE — ED Notes (Signed)
Night time snack given.  Pending Safe Transport to [facility].
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
## 2022-06-19 NOTE — ED Provider Notes (Signed)
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 Decision Making  Patient remains voluntary.  Patient will be admitted to [facility] behavioral health continuous observation while awaiting inpatient psychiatric treatment.   Laboratory studies ordered including CBC, CMP, ethanol, magnesium, prolactin and TSH.  Urine drug screen order initiated.  EKG ordered.   Current medications: -Acetaminophen 650 mg every 6 as needed/mild pain -Maalox 30 mL oral every 4 as needed/digestion -Hydroxyzine 25 mg 3 times daily as needed/anxiety -Magnesium hydroxide 30 mL daily as needed/mild constipation -Nicotine NicoDerm CQ 14 mg patch transdermal daily/nicotine withdrawal  -Insulin aspart 0.9 units subcutaneous 3 times  daily with meals  Home medications initiated including: -Amlodipine 10 mg daily -atorvastatin 40 mg daily -Duloxetine 60 mg daily -Famotidine 10 mg twice daily -Gabapentin 600 mg 3 times daily -Glipizide XL 10 mg daily with breakfast -Insulin glargine 30 units subcutaneous nightly -Metformin 1000 mg twice daily -Tamsulosin 0.4 mg daily -Trazodone 100 mg nightly as needed/sleep  Recommendations  Based on my evaluation the patient does not appear to have an emergency medical condition.   Lenard Lance, FNP 06/19/22  5:37 PM

## 2022-06-19 NOTE — BH Assessment (Addendum)
Disposition:  Per Doran Heater, NP, patient meets criteria for inpatient psychiatric treatment. Patient accepted to room 307-1 pending labs, covid, voluntary consent faxed to [phone]. Patient's nurse Bonita Quin, RN) provided disposition updates.

## 2022-06-19 NOTE — Telephone Encounter (Signed)
Noted  

## 2022-06-20 ENCOUNTER — Encounter (HOSPITAL_COMMUNITY): Payer: Self-pay | Admitting: Family

## 2022-06-20 ENCOUNTER — Telehealth (HOSPITAL_COMMUNITY): Payer: Self-pay | Admitting: Pharmacy Technician

## 2022-06-20 ENCOUNTER — Other Ambulatory Visit (HOSPITAL_COMMUNITY): Payer: Self-pay

## 2022-06-20 LAB — PROLACTIN: Prolactin: 6.9 ng/mL (ref 3.6–25.2)

## 2022-06-20 LAB — GLUCOSE, CAPILLARY
Glucose-Capillary: 384 mg/dL — ABNORMAL HIGH (ref 70–99)
Glucose-Capillary: 184 mg/dL — ABNORMAL HIGH (ref 70–99)
Glucose-Capillary: 143 mg/dL — ABNORMAL HIGH (ref 70–99)
Glucose-Capillary: 260 mg/dL — ABNORMAL HIGH (ref 70–99)
Glucose-Capillary: 251 mg/dL — ABNORMAL HIGH (ref 70–99)

## 2022-06-20 NOTE — TOC Benefit Eligibility Note (Signed)
Patient Product/process development scientist completed.    The patient is currently admitted and upon discharge could be taking Lantus Pens.  The current 30 day co-pay is $4.00.   The patient is currently admitted and upon discharge could be taking Humalog Kwikpen.  The current 30 day co-pay is $4.00.   The patient is insured through [facility] Medicaid   This test claim was processed through [facility]- copay amounts may vary at other pharmacies due to pharmacy/plan contracts, or as the patient moves through the different stages of their insurance plan.  Roland Earl, CPHT Pharmacy Patient Advocate Specialist [facility] Patient Advocate Team Direct Number: [phone]  Fax: [phone]
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
## 2022-06-20 NOTE — Progress Notes (Signed)
Patient is a 62 year old African American male that presents to [facility] facility wanting "to get my life together because I am tired." Patient reports that, "I am ready to get and nobody wants to help and that just makes me want to give up." Patient currently denies SI,HI, AVH. Patient states, "I do not want to kill myself I just want to get help." Patient states that he "wants to get clean." Reports last use of crack was 4 days ago. Patient alert and oriented x4. Patient ambulates on walker. Patient educated on the rules and policies of [facility]. Patient made aware of q15 min checks. Patient oriented to the unit. Patient denies having any questions at this time. Patient given nourishment and hydration.

## 2022-06-20 NOTE — Progress Notes (Signed)
Provider ordered an walker for patient due to unsteady ambulation.

## 2022-06-20 NOTE — Progress Notes (Signed)
   Initial Treatment Plan David Wyatt 12:37 AM 06/20/2022 MRN: [id number]       PATIENT STRESSORS:  Substance abuse   No support system     PATIENT STRENGTHS: Ability for insight  Acceptance of situation      PATIENT IDENTIFIED PROBLEMS: Poor support system  Substance use   Depression  Anxiety   "I am tired"                   DISCHARGE CRITERIA:  Ability to meet basic life and health needs Improved stabilization in mood, thinking, and/or behavior Verbal commitment to aftercare and medication compliance   PRELIMINARY DISCHARGE PLAN: Outpatient therapy Return to previous living arrangement   PATIENT/FAMILY INVOLVEMENT: This treatment plan has been presented to and reviewed with the patient, David Wyatt , The patient has been given the opportunity to ask questions and make suggestions.   [place]  M. Hart Rochester, RN 06/20/2022 12:40 AM

## 2022-06-20 NOTE — BHH Group Notes (Signed)
BHH Group Notes:  (Nursing/MHT/Case Management/Adjunct)  Date:  06/20/2022  Time:  2:44 PM  Type of Therapy:  Emotional Wellness Group  Participation Level:  Did Not Attend  Summary of Progress/Problems:  Patient did not attend emotional wellness group.   Parissa Chiao R Hydeia Mcatee 06/20/2022, 2:44 PM 

## 2022-06-20 NOTE — Progress Notes (Signed)
   06/20/22 1600  Psych Admission Type (Psych Patients Only)  Admission Status Voluntary  Psychosocial Assessment  Patient Complaints Substance abuse  Eye Contact Fair  Facial Expression Flat;Sad  Affect Depressed  Speech Logical/coherent  Interaction Avoidant  Motor Activity Unsteady  Appearance/Hygiene Disheveled  Behavior Characteristics Cooperative  Mood Depressed  Thought Process  Coherency WDL  Content Preoccupation  Delusions None reported or observed  Perception WDL  Hallucination None reported or observed  Judgment WDL  Confusion None  Danger to Self  Current suicidal ideation? Denies  Danger to Others  Danger to Others None reported or observed

## 2022-06-20 NOTE — Inpatient Diabetes Management (Signed)
Inpatient Diabetes Program Recommendations  AACE/ADA: New Consensus Statement on Inpatient Glycemic Control (2015)  Target Ranges:  Prepandial:   less than 140 mg/dL      Peak postprandial:   less than 180 mg/dL (1-2 hours)      Critically ill patients:  140 - 180 mg/dL   Lab Results  Component Value Date   GLUCAP 184 (H) 06/20/2022   HGBA1C 10.4 (H) 01/21/2022    Review of Glycemic Control  Diabetes history: type 2 Outpatient Diabetes medications: Lantus Solostar pen 30 units daily, Humalog Kwikpen 2 units at lunch and dinner, Metformin 1000 mg BID, Glucotrol 10 mg daily Current orders for Inpatient glycemic control: Semglee 30 units at HS, Novolog SENSITIVE correction scale TID, Glucotrol 10 mg daily, Metformin 1000 mg BID  Inpatient Diabetes Program Recommendations:   Received diabetes coordinator consult. Agree with current insulin orders. Noted that patient was not taking Lantus and Humalog for the last 2 months. Benefit check run on Lantus Solostar and Humalog Kwikpen. That is  $4.00 copay for each. The patient should be able to afford the insulin. Recommend that he be questioned as to why he has not been taking the insulin. Will need to follow up with his PCP for further blood glucose control.   Smith Mince RN BSN CDE Diabetes Coordinator Pager: [phone]  8am-5pm

## 2022-06-20 NOTE — Progress Notes (Signed)
   06/20/22 0100  Psych Admission Type (Psych Patients Only)  Admission Status Voluntary  Psychosocial Assessment  Patient Complaints Substance abuse;Worrying;Depression  Eye Contact Fair  Facial Expression Flat;Sad  Affect Depressed;Preoccupied  Artist Cooperative;Appropriate to situation  Mood Depressed;Pleasant  Thought Process  Coherency WDL  Content Preoccupation  Delusions None reported or observed  Perception WDL  Hallucination None reported or observed  Judgment WDL  Confusion None  Danger to Self  Current suicidal ideation? Denies  Agreement Not to Harm Self Yes  Description of Agreement Verbal  Danger to Others  Danger to Others None reported or observed
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
## 2022-06-20 NOTE — BHH Group Notes (Signed)
Patient attended the Wrap-up group.

## 2022-06-20 NOTE — BHH Group Notes (Signed)
BHH Group Notes:  (Nursing/MHT/Case Management/Adjunct)  Date:  06/20/2022  Time:  9:31 AM  Type of Therapy:  Goals/ Orientation Group  Participation Level:  Did Not Attend  Summary of Progress/Problems:  Patient did not attend goals/orientation group today.   David Wyatt 06/20/2022, 9:31 AM

## 2022-06-20 NOTE — Progress Notes (Signed)
   06/20/22 2300  Psych Admission Type (Psych Patients Only)  Admission Status Voluntary  Psychosocial Assessment  Patient Complaints Substance abuse  Eye Contact Fair  Facial Expression Flat;Sad  Affect Depressed  Speech Logical/coherent  Interaction Isolative  Motor Activity Unsteady  Appearance/Hygiene Improved  Behavior Characteristics Cooperative;Anxious  Mood Depressed  Thought Process  Coherency WDL  Content Preoccupation  Delusions None reported or observed  Perception WDL  Hallucination None reported or observed  Judgment WDL  Confusion None  Danger to Self  Current suicidal ideation? Denies  Agreement Not to Harm Self Yes  Description of Agreement verbal  Danger to Others  Danger to Others None reported or observed

## 2022-06-20 NOTE — Telephone Encounter (Signed)
Pharmacy Patient Advocate Encounter  Insurance verification completed.    The patient is insured through [facility]   The patient is currently admitted and ran test claims for the following: Lantus Pens, Humalog Kwik Pens.  Copays and coinsurance results were relayed to Inpatient clinical team.

## 2022-06-21 ENCOUNTER — Encounter (HOSPITAL_COMMUNITY): Payer: Self-pay

## 2022-06-21 LAB — GLUCOSE, CAPILLARY
Glucose-Capillary: 176 mg/dL — ABNORMAL HIGH (ref 70–99)
Glucose-Capillary: 93 mg/dL (ref 70–99)
Glucose-Capillary: 271 mg/dL — ABNORMAL HIGH (ref 70–99)
Glucose-Capillary: 301 mg/dL — ABNORMAL HIGH (ref 70–99)
Glucose-Capillary: 154 mg/dL — ABNORMAL HIGH (ref 70–99)

## 2022-06-21 NOTE — Group Note (Unsigned)
Date:  06/21/2022 Time:  10:44 AM  Group Topic/Focus:  Goals Group:   The focus of this group is to help patients establish daily goals to achieve during treatment and discuss how the patient can incorporate goal setting into their daily lives to aide in recovery. Orientation:   The focus of this group is to educate the patient on the purpose and policies of crisis stabilization and provide a format to answer questions about their admission.  The group details unit policies and expectations of patients while admitted.     Participation Level:  {BHH PARTICIPATION LEVEL:22264}  Participation Quality:  {BHH PARTICIPATION QUALITY:22265}  Affect:  {BHH AFFECT:22266}  Cognitive:  {BHH COGNITIVE:22267}  Insight: {BHH Insight2:20797}  Engagement in Group:  {BHH ENGAGEMENT IN GROUP:22268}  Modes of Intervention:  {BHH MODES OF INTERVENTION:22269}  Additional Comments:  ***  David Wyatt David Wyatt David Wyatt David Wyatt 06/21/2022, 10:44 AM  

## 2022-06-21 NOTE — Group Note (Signed)
Date:  06/21/2022 Time:  10:52 AM  Group Topic/Focus:  Goals Group:   The focus of this group is to help patients establish daily goals to achieve during treatment and discuss how the patient can incorporate goal setting into their daily lives to aide in recovery. Orientation:   The focus of this group is to educate the patient on the purpose and policies of crisis stabilization and provide a format to answer questions about their admission.  The group details unit policies and expectations of patients while admitted.    Participation Level:  Did Not Attend  Participation Quality:    Affect:    Cognitive:    Insight:   Engagement in Group:    Modes of Intervention:    Additional Comments:    Jaquita Rector 06/21/2022, 10:52 AM

## 2022-06-21 NOTE — Progress Notes (Signed)
   06/21/22 0545  15 Minute Checks  Location Bedroom  Visual Appearance Calm  Behavior Sleeping  Sleep (Behavioral Health Patients Only)  Calculate sleep? (Click Yes once per 24 hr at 0600 safety check) Yes  Documented sleep last 24 hours 5.25

## 2022-06-21 NOTE — BH IP Treatment Plan (Signed)
Interdisciplinary Treatment and Diagnostic Plan Update  06/21/2022 Time of Session: 10:40 AM  Lanson Randle MRN: [id number]  Principal Diagnosis: MDD (major depressive disorder), recurrent severe, without psychosis  Secondary Diagnoses: Principal Problem:   MDD (major depressive disorder), recurrent severe, without psychosis   Current Medications:  Current Facility-Administered Medications  Medication Dose Route Frequency Provider Last Rate Last Admin   acetaminophen (TYLENOL) tablet 650 mg  650 mg Oral Q6H PRN Lenard Lance, FNP       alum & mag hydroxide-simeth (MAALOX/MYLANTA) 200-200-20 MG/5ML suspension 30 mL  30 mL Oral Q4H PRN Lenard Lance, FNP       amLODipine (NORVASC) tablet 10 mg  10 mg Oral Daily Lenard Lance, FNP   10 mg at 06/21/22 0752   atorvastatin (LIPITOR) tablet 40 mg  40 mg Oral Daily Lenard Lance, FNP   40 mg at 06/21/22 0752   diphenhydrAMINE (BENADRYL) capsule 50 mg  50 mg Oral TID PRN Lenard Lance, FNP       Or   diphenhydrAMINE (BENADRYL) injection 50 mg  50 mg Intramuscular TID PRN Lenard Lance, FNP       DULoxetine (CYMBALTA) DR capsule 60 mg  60 mg Oral Daily Lenard Lance, FNP   60 mg at 06/21/22 0752   famotidine (PEPCID) tablet 10 mg  10 mg Oral BID Lenard Lance, FNP   10 mg at 06/21/22 7829   gabapentin (NEURONTIN) capsule 600 mg  600 mg Oral TID Lenard Lance, FNP   600 mg at 06/21/22 0752   glipiZIDE (GLUCOTROL XL) 24 hr tablet 10 mg  10 mg Oral Q breakfast Lenard Lance, FNP   10 mg at 06/21/22 5621   haloperidol (HALDOL) tablet 5 mg  5 mg Oral TID PRN Lenard Lance, FNP       Or   haloperidol lactate (HALDOL) injection 5 mg  5 mg Intramuscular TID PRN Lenard Lance, FNP       insulin aspart (novoLOG) injection 0-9 Units  0-9 Units Subcutaneous TID WC Lenard Lance, FNP   5 Units at 06/20/22 1706   insulin glargine-yfgn (SEMGLEE) injection 30 Units  30 Units Subcutaneous QHS Lenard Lance, FNP   30 Units at 06/20/22 2044   LORazepam (ATIVAN)  tablet 2 mg  2 mg Oral TID PRN Lenard Lance, FNP       Or   LORazepam (ATIVAN) injection 2 mg  2 mg Intramuscular TID PRN Lenard Lance, FNP       magnesium hydroxide (MILK OF MAGNESIA) suspension 30 mL  30 mL Oral Daily PRN Lenard Lance, FNP       metFORMIN (GLUCOPHAGE) tablet 1,000 mg  1,000 mg Oral BID WC Lenard Lance, FNP   1,000 mg at 06/21/22 0752   tamsulosin (FLOMAX) capsule 0.4 mg  0.4 mg Oral Daily Lenard Lance, FNP   0.4 mg at 06/21/22 0754   traZODone (DESYREL) tablet 100 mg  100 mg Oral QHS PRN Lenard Lance, FNP       PTA Medications: Medications Prior to Admission  Medication Sig Dispense Refill Last Dose   amLODipine (NORVASC) 10 MG tablet Take 1 tablet (10 mg total) by mouth daily. (Patient not taking: Reported on 06/20/2022) 90 tablet 1 Not Taking   atorvastatin (LIPITOR) 40 MG tablet Take 1 tablet (40 mg total) by mouth daily. (Patient not taking: Reported on 06/20/2022) 90 tablet 1 Not Taking  Blood Glucose Monitoring Suppl (ACCU-CHEK GUIDE) w/Device KIT USE AS DIRECTED TO CHECK BLOOD SUGARS UP TO TWICE PER DAY (Patient not taking: Reported on 06/20/2022) 1 kit 0 Not Taking   buPROPion (WELLBUTRIN XL) 150 MG 24 hr tablet Take 1 tablet (150 mg total) by mouth daily. (Patient not taking: Reported on 06/20/2022) 90 tablet 1 Not Taking   cephALEXin (KEFLEX) 500 MG capsule Take 1 capsule (500 mg total) by mouth 4 (four) times daily. (Patient not taking: Reported on 06/20/2022) 28 capsule 0 Completed Course   DULoxetine (CYMBALTA) 60 MG capsule Take 1 capsule (60 mg total) by mouth daily. (Patient not taking: Reported on 06/20/2022) 90 capsule 1 Not Taking   famotidine (PEPCID) 10 MG tablet Take 1 tablet (10 mg total) by mouth 2 (two) times daily. (Patient not taking: Reported on 06/20/2022) 180 tablet 1 Not Taking   gabapentin (NEURONTIN) 300 MG capsule Take 2 capsules (600 mg total) by mouth 3 (three) times daily. (Patient not taking: Reported on 06/20/2022) 540 capsule 1 Not Taking    glipiZIDE (GLUCOTROL XL) 10 MG 24 hr tablet Take 1 tablet (10 mg total) by mouth daily with breakfast. (Patient not taking: Reported on 06/20/2022) 90 tablet 1 Not Taking   Insulin Glargine Solostar (LANTUS) 100 UNIT/ML Solostar Pen Inject 30 Units into the skin daily. (Patient not taking: Reported on 06/20/2022) 30 mL 6 Not Taking   insulin lispro (HUMALOG KWIKPEN) 100 UNIT/ML KwikPen Inject 2 Units into the skin 2 (two) times daily before lunch and supper. (Patient not taking: Reported on 06/20/2022) 15 mL 6 Not Taking   Insulin Pen Needle 31G X 5 MM MISC Use with insulin 100 each 5    metFORMIN (GLUCOPHAGE) 1000 MG tablet Take 1 tablet (1,000 mg total) by mouth 2 (two) times daily. (Patient not taking: Reported on 06/20/2022) 180 tablet 1 Not Taking   tamsulosin (FLOMAX) 0.4 MG CAPS capsule Take 1 capsule (0.4 mg total) by mouth daily. (Patient not taking: Reported on 06/20/2022) 90 capsule 1 Not Taking   traZODone (DESYREL) 100 MG tablet Take 1 tablet (100 mg total) by mouth at bedtime as needed for sleep. (Patient not taking: Reported on 06/20/2022) 90 tablet 1 Not Taking    Patient Stressors:    Patient Strengths:    Treatment Modalities: Medication Management, Group therapy, Case management,  1 to 1 session with clinician, Psychoeducation, Recreational therapy.   Physician Treatment Plan for Primary Diagnosis: MDD (major depressive disorder), recurrent severe, without psychosis Long Term Goal(s): Improvement in symptoms so as ready for discharge   Short Term Goals: Ability to identify changes in lifestyle to reduce recurrence of condition will improve Ability to verbalize feelings will improve Ability to disclose and discuss suicidal ideas Ability to demonstrate self-control will improve Ability to identify and develop effective coping behaviors will improve Ability to maintain clinical measurements within normal limits will improve Compliance with prescribed medications will  improve  Medication Management: Evaluate patient's response, side effects, and tolerance of medication regimen.  Therapeutic Interventions: 1 to 1 sessions, Unit Group sessions and Medication administration.  Evaluation of Outcomes: Not Progressing  Physician Treatment Plan for Secondary Diagnosis: Principal Problem:   MDD (major depressive disorder), recurrent severe, without psychosis  Long Term Goal(s): Improvement in symptoms so as ready for discharge   Short Term Goals: Ability to identify changes in lifestyle to reduce recurrence of condition will improve Ability to verbalize feelings will improve Ability to disclose and discuss suicidal ideas Ability to demonstrate self-control  will improve Ability to identify and develop effective coping behaviors will improve Ability to maintain clinical measurements within normal limits will improve Compliance with prescribed medications will improve     Medication Management: Evaluate patient's response, side effects, and tolerance of medication regimen.  Therapeutic Interventions: 1 to 1 sessions, Unit Group sessions and Medication administration.  Evaluation of Outcomes: Not Progressing   RN Treatment Plan for Primary Diagnosis: MDD (major depressive disorder), recurrent severe, without psychosis Long Term Goal(s): Knowledge of disease and therapeutic regimen to maintain health will improve  Short Term Goals: Ability to remain free from injury will improve, Ability to verbalize frustration and anger appropriately will improve, Ability to demonstrate self-control, Ability to participate in decision making will improve, Ability to verbalize feelings will improve, Ability to disclose and discuss suicidal ideas, Ability to identify and develop effective coping behaviors will improve, and Compliance with prescribed medications will improve  Medication Management: RN will administer medications as ordered by provider, will assess and evaluate  patient's response and provide education to patient for prescribed medication. RN will report any adverse and/or side effects to prescribing provider.  Therapeutic Interventions: 1 on 1 counseling sessions, Psychoeducation, Medication administration, Evaluate responses to treatment, Monitor vital signs and CBGs as ordered, Perform/monitor CIWA, COWS, AIMS and Fall Risk screenings as ordered, Perform wound care treatments as ordered.  Evaluation of Outcomes: Not Progressing   LCSW Treatment Plan for Primary Diagnosis: MDD (major depressive disorder), recurrent severe, without psychosis Long Term Goal(s): Safe transition to appropriate next level of care at discharge, Engage patient in therapeutic group addressing interpersonal concerns.  Short Term Goals: Engage patient in aftercare planning with referrals and resources, Increase social support, Increase ability to appropriately verbalize feelings, Increase emotional regulation, Facilitate acceptance of mental health diagnosis and concerns, Facilitate patient progression through stages of change regarding substance use diagnoses and concerns, Identify triggers associated with mental health/substance abuse issues, and Increase skills for wellness and recovery  Therapeutic Interventions: Assess for all discharge needs, 1 to 1 time with Social worker, Explore available resources and support systems, Assess for adequacy in community support network, Educate family and significant other(s) on suicide prevention, Complete Psychosocial Assessment, Interpersonal group therapy.  Evaluation of Outcomes: Not Progressing   Progress in Treatment: Attending groups: No. Participating in groups: No. Taking medication as prescribed: Yes. Toleration medication: Yes. Family/Significant other contact made: No, will contact:  Whoever patient  Patient understands diagnosis: Yes. Discussing patient identified problems/goals with staff: Yes. Medical problems  stabilized or resolved: Yes. Denies suicidal/homicidal ideation: Yes. Issues/concerns per patient self-inventory: No.   New problem(s) identified: No, Describe:  None reported   New Short Term/Long Term Goal(s): PER EPIC notes : "needing help with resources that can assist with housing and transportation"    Patient Goals:  " I need substance programs to help house me "   Discharge Plan or Barriers: Patient recently admitted. CSW will continue to follow and assess for appropriate referrals and possible discharge planning.    Reason for Continuation of Hospitalization: Anxiety Depression Medical Issues Medication stabilization Other; describe Pt is homeless and states that he need a residential that can house him   Estimated Length of Stay: 5-7 days   Last 3 [place] Suicide Severity Risk Score: Flowsheet Row Ad[facility] (Current) [facility] Most recent reading at 06/20/2022  1:00 AM ED from 06/19/2022 in [facility] Most recent reading at 06/19/2022  5:54 PM ED from 06/17/2022  in [facility] Most recent reading at 06/17/2022  4:44 PM  C-SSRS RISK CATEGORY No Risk No Risk No Risk       Last PHQ 2/9 Scores:    03/08/2022   12:56 PM 03/06/2022   11:59 AM 03/03/2022    9:47 AM  Depression screen PHQ 2/9  Decreased Interest 0 0 0  Down, Depressed, Hopeless 0 0 1  PHQ - 2 Score 0 0 1  Altered sleeping 0 0 1  Tired, decreased energy 0 0 0  Change in appetite 0 0 0  Feeling bad or failure about yourself  0 0 0  Trouble concentrating 0 0 0  Moving slowly or fidgety/restless 0 0 0  Suicidal thoughts 0 0 0  PHQ-9 Score 0 0 2  Difficult doing work/chores Not difficult at all Not difficult at all Not difficult at all    Scribe for Treatment Team: Isabella Bowens, LCSWA 06/21/2022 11:56 AM

## 2022-06-21 NOTE — Group Note (Signed)
Recreation Therapy Group Note   Group Topic:Relaxation  Group Date: 06/21/2022 Start Time: 0930 End Time: 1022 Facilitators: Makira Holleman-McCall, LRT,CTRS Location: [address] Dayroom   Goal Area(s) Addresses:  Patient will define components of whole wellness. Patient will verbalize benefit of whole wellness.  Group Description: Music Therapy.  LRT and patients discussed how the types of music you listen to can help you clear your mind, feel calm and relax.  Patients were then given the opportunity to pick songs that they found relaxing and calming.    Affect/Mood: N/A   Participation Level: Did not attend    Clinical Observations/Individualized Feedback:     Plan: Continue to engage patient in RT group sessions 2-3x/week.   Leea Rambeau-McCall, LRT,CTRS 06/21/2022 12:29 PM

## 2022-06-21 NOTE — Progress Notes (Signed)
Pt denied SI/HI/AVH this morning. Pt rated his depression a 5/10, anxiety a 5/10, and feelings of hopelessness a 0/10. Pt has been pleasant, calm, and cooperative throughout the shift. RN provided support and encouragement to patient. Pt given scheduled medications as prescribed. Q15 min checks verified for safety. Patient verbally contracts for safety. Patient compliant with medications and treatment plan. Patient is interacting well on the unit. Pt is safe on the unit.   06/21/22 0800  Psych Admission Type (Psych Patients Only)  Admission Status Voluntary  Psychosocial Assessment  Patient Complaints Substance abuse;Depression  Eye Contact Fair  Facial Expression Flat;Sad  Affect Depressed  Speech Logical/coherent  Interaction Isolative  Motor Activity Unsteady;Slow  Appearance/Hygiene Disheveled  Behavior Characteristics Cooperative;Anxious  Mood Depressed  Thought Process  Coherency WDL  Content WDL  Delusions None reported or observed  Perception WDL  Hallucination None reported or observed  Judgment Impaired  Confusion None  Danger to Self  Current suicidal ideation? Denies  Description of Suicide Plan No plan  Agreement Not to Harm Self Yes  Description of Agreement Pt verbally contracts for safety  Danger to Others  Danger to Others None reported or observed

## 2022-06-21 NOTE — Group Note (Signed)
Date:  06/21/2022 Time:  2:06 PM  Group Topic/Focus:  Emotional Education:   The focus of this group is to discuss what feelings/emotions are, and how they are experienced.    Participation Level:  Did Not Attend  Participation Quality:    Affect:    Cognitive:    Insight:   Engagement in Group:    Modes of Intervention:    Additional Comments:    Jaquita Rector 06/21/2022, 2:06 PM
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
## 2022-06-21 NOTE — Progress Notes (Signed)
Pt attend AA meeting tonight

## 2022-06-22 LAB — GLUCOSE, CAPILLARY
Glucose-Capillary: 231 mg/dL — ABNORMAL HIGH (ref 70–99)
Glucose-Capillary: 182 mg/dL — ABNORMAL HIGH (ref 70–99)
Glucose-Capillary: 273 mg/dL — ABNORMAL HIGH (ref 70–99)
Glucose-Capillary: 116 mg/dL — ABNORMAL HIGH (ref 70–99)

## 2022-06-22 MED ORDER — LOPERAMIDE HCL 2 MG PO CAPS
4.0000 mg | ORAL_CAPSULE | ORAL | Status: DC | PRN
  Administered 2022-06-22 – 2022-06-23 (×2): 4 mg via ORAL
  Filled 2022-06-22 (×2): qty 2

## 2022-06-22 NOTE — Progress Notes (Signed)
Pt denied SI/HI/AVH this morning. Patient complains of 10/10 chronic lower back pain today and explains that it is making him have difficulty with walking. PRN tylenol provided for pain. Patient complains of nausea, vomiting, and diarrhea this morning. Patient reports that he has been homeless and not taking his medications for awhile which is why he believes he is having diarrhea and vomiting. RN provided patient with ginger ale and saltines. Pt has remained isolated in his room for most of the day. RN provided support and encouragement to patient. Pt given scheduled medications as prescribed. Q15 min checks verified for safety. Patient verbally contracts for safety. Patient compliant with medications and treatment plan. Pt is safe on the unit.   06/22/22 0800  Psych Admission Type (Psych Patients Only)  Admission Status Voluntary  Psychosocial Assessment  Patient Complaints Hopelessness  Eye Contact Fair  Facial Expression Sad  Affect Appropriate to circumstance  Speech Logical/coherent  Interaction Assertive  Motor Activity Slow;Unsteady  Appearance/Hygiene Disheveled  Behavior Characteristics Appropriate to situation;Cooperative  Mood Pleasant  Thought Process  Coherency WDL  Content WDL  Delusions None reported or observed  Perception WDL  Hallucination None reported or observed  Judgment Impaired  Confusion None  Danger to Self  Current suicidal ideation? Denies  Description of Suicide Plan No plan  Agreement Not to Harm Self Yes  Description of Agreement Pt verbally contracts for safety  Danger to Others  Danger to Others None reported or observed

## 2022-06-22 NOTE — [facility] Group Notes (Signed)
Pt did not attend wrap-up group   
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
## 2022-06-22 NOTE — Progress Notes (Signed)
   06/21/22 2100  Psych Admission Type (Psych Patients Only)  Admission Status Voluntary  Psychosocial Assessment  Patient Complaints Hopelessness;Helplessness  Eye Contact Fair  Facial Expression Animated  Affect Appropriate to circumstance  Speech Logical/coherent  Interaction Assertive  Motor Activity Slow  Appearance/Hygiene Unremarkable  Behavior Characteristics Cooperative;Appropriate to situation  Mood Pleasant  Thought Process  Coherency WDL  Content WDL  Delusions None reported or observed  Perception WDL  Hallucination None reported or observed  Judgment Impaired  Confusion None  Danger to Self  Current suicidal ideation? Denies  Agreement Not to Harm Self Yes  Description of Agreement verbal  Danger to Others  Danger to Others None reported or observed

## 2022-06-23 ENCOUNTER — Inpatient Hospital Stay (HOSPITAL_COMMUNITY): Payer: Medicaid Other

## 2022-06-23 ENCOUNTER — Other Ambulatory Visit: Payer: Self-pay

## 2022-06-23 ENCOUNTER — Encounter (HOSPITAL_COMMUNITY): Payer: Self-pay | Admitting: Emergency Medicine

## 2022-06-23 DIAGNOSIS — F332 Major depressive disorder, recurrent severe without psychotic features: Principal | ICD-10-CM

## 2022-06-23 LAB — HEMOGLOBIN A1C
Hgb A1c MFr Bld: 11.4 % — ABNORMAL HIGH (ref 4.8–5.6)
Mean Plasma Glucose: 280.48 mg/dL

## 2022-06-23 LAB — GLUCOSE, CAPILLARY
Glucose-Capillary: 113 mg/dL — ABNORMAL HIGH (ref 70–99)
Glucose-Capillary: 85 mg/dL (ref 70–99)
Glucose-Capillary: 94 mg/dL (ref 70–99)

## 2022-06-23 LAB — CBC WITH DIFFERENTIAL/PLATELET
Abs Immature Granulocytes: 0.06 10*3/uL (ref 0.00–0.07)
Basophils Absolute: 0 10*3/uL (ref 0.0–0.1)
Basophils Relative: 0 %
Eosinophils Absolute: 0.5 10*3/uL (ref 0.0–0.5)
Eosinophils Relative: 4 %
HCT: 41 % (ref 39.0–52.0)
Hemoglobin: 12.7 g/dL — ABNORMAL LOW (ref 13.0–17.0)
Immature Granulocytes: 1 %
Lymphocytes Relative: 17 %
Lymphs Abs: 2.2 10*3/uL (ref 0.7–4.0)
MCH: 29.5 pg (ref 26.0–34.0)
MCHC: 31 g/dL (ref 30.0–36.0)
MCV: 95.3 fL (ref 80.0–100.0)
Monocytes Absolute: 1.1 10*3/uL — ABNORMAL HIGH (ref 0.1–1.0)
Monocytes Relative: 8 %
Neutro Abs: 9.1 10*3/uL — ABNORMAL HIGH (ref 1.7–7.7)
Neutrophils Relative %: 70 %
Platelets: 475 10*3/uL — ABNORMAL HIGH (ref 150–400)
RBC: 4.3 MIL/uL (ref 4.22–5.81)
RDW: 13.3 % (ref 11.5–15.5)
WBC: 13 10*3/uL — ABNORMAL HIGH (ref 4.0–10.5)
nRBC: 0 % (ref 0.0–0.2)

## 2022-06-23 LAB — C DIFFICILE QUICK SCREEN W PCR REFLEX
C Diff antigen: NEGATIVE
C Diff interpretation: NOT DETECTED
C Diff toxin: NEGATIVE

## 2022-06-23 LAB — GASTROINTESTINAL PANEL BY PCR, STOOL (REPLACES STOOL CULTURE)

## 2022-06-23 LAB — COMPREHENSIVE METABOLIC PANEL
ALT: 16 U/L (ref 0–44)
AST: 17 U/L (ref 15–41)
Albumin: 3.2 g/dL — ABNORMAL LOW (ref 3.5–5.0)
Alkaline Phosphatase: 79 U/L (ref 38–126)
Anion gap: 10 (ref 5–15)
BUN: 25 mg/dL — ABNORMAL HIGH (ref 8–23)
CO2: 21 mmol/L — ABNORMAL LOW (ref 22–32)
Calcium: 8.6 mg/dL — ABNORMAL LOW (ref 8.9–10.3)
Chloride: 105 mmol/L (ref 98–111)
Creatinine, Ser: 1.16 mg/dL (ref 0.61–1.24)
GFR, Estimated: >60 mL/min (ref >60)
Glucose, Bld: 109 mg/dL — ABNORMAL HIGH (ref 70–99)
Potassium: 4.3 mmol/L (ref 3.5–5.1)
Sodium: 136 mmol/L (ref 135–145)
Total Bilirubin: 1 mg/dL (ref 0.3–1.2)
Total Protein: 7.6 g/dL (ref 6.5–8.1)

## 2022-06-23 LAB — TSH: TSH: 0.321 u[IU]/mL — ABNORMAL LOW (ref 0.350–4.500)

## 2022-06-23 MED ORDER — ONDANSETRON 4 MG PO TBDP: 4.0000 mg | ORAL_TABLET | Freq: Four times a day (QID) | ORAL | Status: DC | PRN

## 2022-06-23 MED ORDER — SODIUM CHLORIDE 0.9 % IV BOLUS
1000.0000 mL | Freq: Once | INTRAVENOUS | Status: AC
  Administered 2022-06-23: 1000 mL via INTRAVENOUS

## 2022-06-23 MED ORDER — ONDANSETRON HCL 4 MG/2ML IJ SOLN
4.0000 mg | Freq: Once | INTRAMUSCULAR | Status: AC
  Administered 2022-06-23: 4 mg via INTRAVENOUS
  Filled 2022-06-23: qty 2

## 2022-06-23 NOTE — Progress Notes (Signed)
   06/22/22 2300  Psych Admission Type (Psych Patients Only)  Admission Status Voluntary  Psychosocial Assessment  Patient Complaints Isolation  Eye Contact Fair  Facial Expression Animated  Affect Appropriate to circumstance  Speech Logical/coherent  Interaction Assertive  Motor Activity Slow  Appearance/Hygiene Unremarkable  Behavior Characteristics Cooperative;Appropriate to situation  Mood Anxious  Thought Process  Coherency WDL  Content WDL  Delusions None reported or observed  Perception WDL  Hallucination None reported or observed  Judgment Impaired  Confusion None  Danger to Self  Current suicidal ideation? Denies  Agreement Not to Harm Self Yes  Description of Agreement verbal  Danger to Others  Danger to Others None reported or observed   Pt observed in the room anxious and ringing the bell frequently. Pt appeared weak and wambles more than yesterday. Pt offered drinking water but declined, kept asking for ice cream despite being diabetic. Pt became irritable when was explained why he could not have ice cream. Pt still remains on isolation awaiting lab result, will continue to monitor.

## 2022-06-23 NOTE — ED Notes (Addendum)
Patient ambulatory to bathroom with steady gait. Dinner tray empty upon entering room - pt requesting ginger ale and graham crackers. Diet soda and crackers provided.

## 2022-06-23 NOTE — ED Triage Notes (Addendum)
Patient BIB [facility] from [facility]. Has had nausea, vomiting, diarrhea x2 days. Not feeling suicidal today.

## 2022-06-23 NOTE — ED Provider Notes (Signed)
[place] EMERGENCY DEPARTMENT AT [facility] Provider Note   CSN: [id number] Arrival date & time: 06/23/22  1625     History  Chief Complaint  Patient presents with   Emesis    David Wyatt is a 62 y.o. male history of hypertension, depression, bipolar here presenting with vomiting.  Patient was at behavioral health and started vomiting having diarrhea since yesterday.  Patient had a negative GI pathogen panel.  Patient was sent here from behavioral health for IV fluids and C. difficile testing and KUB.  Patient denies any abdominal pain.  The history is provided by the patient.       Home Medications Prior to Admission medications   Medication Sig Start Date End Date Taking? Authorizing Provider  amLODipine (NORVASC) 10 MG tablet Take 1 tablet (10 mg total) by mouth daily. Patient not taking: Reported on 06/20/2022 04/10/22   Hoy Register, MD  atorvastatin (LIPITOR) 40 MG tablet Take 1 tablet (40 mg total) by mouth daily. Patient not taking: Reported on 06/20/2022 04/10/22   Hoy Register, MD  Blood Glucose Monitoring Suppl (ACCU-CHEK GUIDE) w/Device KIT USE AS DIRECTED TO CHECK BLOOD SUGARS UP TO TWICE PER DAY Patient not taking: Reported on 06/20/2022 01/23/22   Princess Bruins, DO  buPROPion (WELLBUTRIN XL) 150 MG 24 hr tablet Take 1 tablet (150 mg total) by mouth daily. Patient not taking: Reported on 06/20/2022 04/10/22   Hoy Register, MD  cephALEXin (KEFLEX) 500 MG capsule Take 1 capsule (500 mg total) by mouth 4 (four) times daily. Patient not taking: Reported on 06/20/2022 06/07/22   Al Decant, PA-C  DULoxetine (CYMBALTA) 60 MG capsule Take 1 capsule (60 mg total) by mouth daily. Patient not taking: Reported on 06/20/2022 04/10/22   Hoy Register, MD  famotidine (PEPCID) 10 MG tablet Take 1 tablet (10 mg total) by mouth 2 (two) times daily. Patient not taking: Reported on 06/20/2022 04/10/22   Hoy Register, MD  gabapentin (NEURONTIN) 300 MG  capsule Take 2 capsules (600 mg total) by mouth 3 (three) times daily. Patient not taking: Reported on 06/20/2022 04/10/22   Hoy Register, MD  glipiZIDE (GLUCOTROL XL) 10 MG 24 hr tablet Take 1 tablet (10 mg total) by mouth daily with breakfast. Patient not taking: Reported on 06/20/2022 04/10/22   Hoy Register, MD  Insulin Glargine Solostar (LANTUS) 100 UNIT/ML Solostar Pen Inject 30 Units into the skin daily. Patient not taking: Reported on 06/20/2022 04/10/22   Hoy Register, MD  insulin lispro (HUMALOG KWIKPEN) 100 UNIT/ML KwikPen Inject 2 Units into the skin 2 (two) times daily before lunch and supper. Patient not taking: Reported on 06/20/2022 04/10/22   Hoy Register, MD  Insulin Pen Needle 31G X 5 MM MISC Use with insulin 04/11/22   Storm Frisk, MD  metFORMIN (GLUCOPHAGE) 1000 MG tablet Take 1 tablet (1,000 mg total) by mouth 2 (two) times daily. Patient not taking: Reported on 06/20/2022 04/10/22   Hoy Register, MD  tamsulosin (FLOMAX) 0.4 MG CAPS capsule Take 1 capsule (0.4 mg total) by mouth daily. Patient not taking: Reported on 06/20/2022 04/10/22   Hoy Register, MD  traZODone (DESYREL) 100 MG tablet Take 1 tablet (100 mg total) by mouth at bedtime as needed for sleep. Patient not taking: Reported on 06/20/2022 04/10/22   Hoy Register, MD  loratadine (CLARITIN) 10 MG tablet Take 1 tablet (10 mg total) by mouth daily. Patient not taking: No sig reported 06/03/17 07/20/20  Loren Racer, MD  Allergies    Lisinopril    Review of Systems   Review of Systems  Gastrointestinal:  Positive for diarrhea and vomiting.  All other systems reviewed and are negative.   Physical Exam Updated Vital Signs BP 122/81   Pulse 92   Temp 98 F (36.7 C) (Oral)   Resp 15   Ht  (1.727 m)   Wt 75 kg   SpO2 96%   BMI 25.14 kg/m  Physical Exam Vitals and nursing note reviewed.  Constitutional:      Comments: Slightly dehydrated  HENT:     Head: Normocephalic.      Nose: Nose normal.     Mouth/Throat:     Mouth: Mucous membranes are dry.  Eyes:     Extraocular Movements: Extraocular movements intact.     Pupils: Pupils are equal, round, and reactive to light.  Cardiovascular:     Rate and Rhythm: Normal rate and regular rhythm.     Pulses: Normal pulses.     Heart sounds: Normal heart sounds.  Pulmonary:     Effort: Pulmonary effort is normal.     Breath sounds: Normal breath sounds.  Abdominal:     General: Abdomen is flat.     Palpations: Abdomen is soft.  Musculoskeletal:        General: Normal range of motion.     Cervical back: Normal range of motion and neck supple.  Skin:    General: Skin is warm.     Capillary Refill: Capillary refill takes less than 2 seconds.  Neurological:     General: No focal deficit present.     Mental Status: He is oriented to person, place, and time.  Psychiatric:        Mood and Affect: Mood normal.        Behavior: Behavior normal.     ED Results / Procedures / Treatments   Labs (all labs ordered are listed, but only abnormal results are displayed) Labs Reviewed  GLUCOSE, CAPILLARY - Abnormal; Notable for the following components:      Result Value   Glucose-Capillary 184 (*)    All other components within normal limits  GLUCOSE, CAPILLARY - Abnormal; Notable for the following components:   Glucose-Capillary 143 (*)    All other components within normal limits  GLUCOSE, CAPILLARY - Abnormal; Notable for the following components:   Glucose-Capillary 260 (*)    All other components within normal limits  GLUCOSE, CAPILLARY - Abnormal; Notable for the following components:   Glucose-Capillary 251 (*)    All other components within normal limits  GLUCOSE, CAPILLARY - Abnormal; Notable for the following components:   Glucose-Capillary 176 (*)    All other components within normal limits  GLUCOSE, CAPILLARY - Abnormal; Notable for the following components:   Glucose-Capillary 271 (*)    All other  components within normal limits  GLUCOSE, CAPILLARY - Abnormal; Notable for the following components:   Glucose-Capillary 301 (*)    All other components within normal limits  GLUCOSE, CAPILLARY - Abnormal; Notable for the following components:   Glucose-Capillary 154 (*)    All other components within normal limits  GLUCOSE, CAPILLARY - Abnormal; Notable for the following components:   Glucose-Capillary 231 (*)    All other components within normal limits  GLUCOSE, CAPILLARY - Abnormal; Notable for the following components:   Glucose-Capillary 182 (*)    All other components within normal limits  GLUCOSE, CAPILLARY - Abnormal; Notable for the following components:  Glucose-Capillary 273 (*)    All other components within normal limits  GLUCOSE, CAPILLARY - Abnormal; Notable for the following components:   Glucose-Capillary 116 (*)    All other components within normal limits  GLUCOSE, CAPILLARY - Abnormal; Notable for the following components:   Glucose-Capillary 113 (*)    All other components within normal limits  GASTROINTESTINAL PANEL BY PCR, STOOL (REPLACES STOOL CULTURE)  C DIFFICILE QUICK SCREEN W PCR REFLEX    GLUCOSE, CAPILLARY  GLUCOSE, CAPILLARY  T3, FREE  T4  TSH  HEMOGLOBIN A1C  CBC WITH DIFFERENTIAL/PLATELET  COMPREHENSIVE METABOLIC PANEL    EKG None  Radiology No results found.  Procedures Procedures    Medications Ordered in ED Medications  acetaminophen (TYLENOL) tablet 650 mg (650 mg Oral Given 06/23/22 0813)  alum & mag hydroxide-simeth (MAALOX/MYLANTA) 200-200-20 MG/5ML suspension 30 mL (has no administration in time range)  amLODipine (NORVASC) tablet 10 mg (0 mg Oral Hold 06/23/22 0811)  atorvastatin (LIPITOR) tablet 40 mg (40 mg Oral Given 06/23/22 0813)  DULoxetine (CYMBALTA) DR capsule 60 mg (60 mg Oral Given 06/23/22 0814)  famotidine (PEPCID) tablet 10 mg (10 mg Oral Given 06/23/22 0814)  gabapentin (NEURONTIN) capsule 600 mg (600 mg Oral  Given 06/23/22 1259)  glipiZIDE (GLUCOTROL XL) 24 hr tablet 10 mg (10 mg Oral Given 06/23/22 0814)  insulin aspart (novoLOG) injection 0-9 Units ( Subcutaneous Not Given 06/23/22 1247)  insulin glargine-yfgn (SEMGLEE) injection 30 Units (30 Units Subcutaneous Given 06/22/22 2211)  magnesium hydroxide (MILK OF MAGNESIA) suspension 30 mL (has no administration in time range)  metFORMIN (GLUCOPHAGE) tablet 1,000 mg (1,000 mg Oral Given 06/23/22 0814)  tamsulosin (FLOMAX) capsule 0.4 mg (0.4 mg Oral Given 06/23/22 0814)  traZODone (DESYREL) tablet 100 mg (100 mg Oral Given 06/22/22 2213)  haloperidol (HALDOL) tablet 5 mg (has no administration in time range)    Or  haloperidol lactate (HALDOL) injection 5 mg (has no administration in time range)  LORazepam (ATIVAN) tablet 2 mg (has no administration in time range)    Or  LORazepam (ATIVAN) injection 2 mg (has no administration in time range)  diphenhydrAMINE (BENADRYL) capsule 50 mg (has no administration in time range)    Or  diphenhydrAMINE (BENADRYL) injection 50 mg (has no administration in time range)  loperamide (IMODIUM) capsule 4 mg (4 mg Oral Given 06/23/22 0813)  sodium chloride 0.9 % bolus 1,000 mL (has no administration in time range)  ondansetron (ZOFRAN) injection 4 mg (has no administration in time range)    ED Course/ Medical Decision Making/ A&P                             Medical Decision Making David Wyatt is a 62 y.o. male here presenting with vomiting and diarrhea.  I think likely viral gastroenteritis.  Will recheck labs and get KUB.  Will also send C. difficile as per behavioral health request.  However my suspicion for C. difficile is low.  Will hydrate and reassess.  9:23 PM C. difficile is negative.  White blood cell count is 13 but his x-rays showed no obstructive pattern.  Patient is tolerated recommend Zofran as needed.  Stable for discharge back to [facility]. Recommend zofran 4 mg Q 6 prn and continue imodium as needed    Amount and/or Complexity of Data Reviewed Labs: ordered. Decision-making details documented in ED Course. Radiology: ordered and independent interpretation performed.  Risk Prescription drug management.  Final Clinical Impression(s) / ED Diagnoses Final diagnoses:  None    Rx / DC Orders ED Discharge Orders     None         Charlynne Pander, MD 06/23/22 2125

## 2022-06-23 NOTE — ED Notes (Signed)
[facility] called for transportation to [facility]. 

## 2022-06-23 NOTE — Progress Notes (Signed)
Pt is back to the unit from [facility], alert and oriented x 4. Ambulates well with walker, denied pain, SI/HI and contracted for safety. Pt offered snacks, fluids and his bed time medication given as scheduled, tolerated well, will continue to monitor.

## 2022-06-23 NOTE — Progress Notes (Signed)
   06/23/22 2300  Psych Admission Type (Psych Patients Only)  Admission Status Voluntary  Psychosocial Assessment  Patient Complaints None  Eye Contact Fair  Facial Expression Animated  Affect Appropriate to circumstance  Speech Logical/coherent  Interaction Assertive  Motor Activity Slow  Appearance/Hygiene Unremarkable  Behavior Characteristics Cooperative;Appropriate to situation  Mood Pleasant  Thought Process  Coherency WDL  Content WDL  Delusions None reported or observed  Perception WDL  Hallucination None reported or observed  Judgment Impaired  Confusion None  Danger to Self  Current suicidal ideation? Denies  Agreement Not to Harm Self Yes  Description of Agreement verbal  Danger to Others  Danger to Others None reported or observed

## 2022-06-23 NOTE — Progress Notes (Signed)
[facility] MD Progress Note  06/23/2022 8:56 AM David Wyatt  MRN:  [id number] Subjective:   Principal Problem: MDD (major depressive disorder), recurrent severe, without psychosis Diagnosis: Principal Problem:   MDD (major depressive disorder), recurrent severe, without psychosis  HPI: David Wyatt is a 62 year old African-American male with past psychiatric history of cocaine use disorder, major depressive disorder recurrent severe without psychosis who initially presented to [facility] voluntarily as a walk-in via law enforcement from [facility] for suicidal ideations. Patient was admitted via the [facility] and recommended for inpatient hospitalization; he was transferred to [facility] 06/19/2022.   24 hour chart review: Patient has remained in his room on Precautions after having multiple instances of nausea, vomiting and diarrhea. He has not been attending any groups and has received meals in his room. Unable to tolerate PO. Slept overnight with out any issues. Per chart review, patient had reactive HCV test 06/07/22.   Assessment: Patient observed laying in bed. He presents ill, weak, and disheveled; reports no shower today or last night.  He report being unable to tolerate PO; recently vomited. Endorses feeling weak and active headache. Describes stools as loose and frequent; notably malodorous. Reports recently being sick at [facility]. Today he denies any active SI/HI/AVH; concerned about physical health. There is no evidence of delusional or paranoid thoughts at this time. He contracts for safety on the unit. Provider discussed concerns about physical state and need for further medical workup in which patient is amenable to. He denies any safety concerns at this time. Report called to Jamal Maes, MD [phone]. Treatment plan detailed below.   Total Time spent with patient: 45 minutes  Past Psychiatric History: depression and alcohol use disorder, crack cocaine use disorder   Past Medical History:  Past Medical History:  Diagnosis  Date   Angioedema of lips 07/28/2012   left upper (07/29/2012)   Arthritis    hands and back   Chronic back pain    Cocaine abuse    Diabetic neuropathy    High cholesterol    Hypertension    Neuropathy    Type II diabetes mellitus     Past Surgical History:  Procedure Laterality Date   BACK SURGERY     CYSTOSCOPY W/ URETERAL STENT PLACEMENT Right 02/23/2021   Procedure: CYSTOSCOPY WITH RETROGRADE PYELOGRAM/URETERAL STENT PLACEMENT;  Surgeon: Jannifer Hick, MD;  Location: [facility] ORS;  Service: Urology;  Laterality: Right;   HERNIA REPAIR Right 04/01/2012   I & D EXTREMITY Left 12/13/2012   Procedure: IRRIGATION AND DEBRIDEMENT LEFT THUMB;  Surgeon: Tami Ribas, MD;  Location: [facility] OR;  Service: Orthopedics;  Laterality: Left;   INCISION AND DRAINAGE OF WOUND     boil on back/notes 07/14/2008 (07/29/2012)   INGUINAL HERNIA REPAIR  04/01/2012   Procedure: HERNIA REPAIR INGUINAL ADULT;  Surgeon: Currie Paris, MD;  Location: [facility] OR;  Service: General;  Laterality: Right;   INGUINAL HERNIA REPAIR Left 09/06/2016   Procedure: OPEN REPAIR LEFT INGUINAL HERNIA;  Surgeon: Gaynelle Adu, MD;  Location: [facility] OR;  Service: General;  Laterality: Left;   INSERTION OF MESH Left 09/06/2016   Procedure: INSERTION OF MESH;  Surgeon: Gaynelle Adu, MD;  Location: [facility] OR;  Service: General;  Laterality: Left;   TONSILLECTOMY     Family History:  Family History  Problem Relation Age of Onset   Diabetes Mother    Kidney disease Mother    Hyperlipidemia Mother    Hyperlipidemia Father    Diabetes Father  Family Psychiatric  History: see H&P Social History:  Social History   Substance and Sexual Activity  Alcohol Use No   Alcohol/week: 0.0 standard drinks of alcohol     Social History   Substance and Sexual Activity  Drug Use Not Currently   Types: "Crack" cocaine    Social History   Socioeconomic History   Marital status: Divorced    Spouse name: Not on file   Number of children: 3   Years  of education: 12   Highest education level: Not on file  Occupational History    Comment: disabled  Tobacco Use   Smoking status: Every Day    Packs/day: 0.25    Years: 30.00    Additional pack years: 0.00    Total pack years: 7.50    Types: Cigarettes   Smokeless tobacco: Former    Quit date: 08/15/2015   Tobacco comments:    04/29/16 2  cigs daily, 10/27/17 sometimes < .25 PPD  Vaping Use   Vaping Use: Never used  Substance and Sexual Activity   Alcohol use: No    Alcohol/week: 0.0 standard drinks of alcohol   Drug use: Not Currently    Types: "Crack" cocaine   Sexual activity: Not on file  Other Topics Concern   Not on file  Social History Narrative   04/29/17   Lives in shelter   Caffeine- a lot of  tea, coffee   Social Determinants of Health   Financial Resource Strain: Not on file  Food Insecurity: Food Insecurity Present (06/20/2022)   Hunger Vital Sign    Worried About Running Out of Food in the Last Year: Sometimes true    Ran Out of Food in the Last Year: Sometimes true  Transportation Needs: Unmet Transportation Needs (06/20/2022)   PRAPARE - Administrator, Civil Service (Medical): Yes    Lack of Transportation (Non-Medical): Patient declined  Physical Activity: Not on file  Stress: Not on file  Social Connections: Not on file   Additional Social History:    Sleep: Good  Appetite:  Good  Current Medications: Current Facility-Administered Medications  Medication Dose Route Frequency Provider Last Rate Last Admin   acetaminophen (TYLENOL) tablet 650 mg  650 mg Oral Q6H PRN Lenard Lance, FNP   650 mg at 06/23/22 0813   alum & mag hydroxide-simeth (MAALOX/MYLANTA) 200-200-20 MG/5ML suspension 30 mL  30 mL Oral Q4H PRN Lenard Lance, FNP       amLODipine (NORVASC) tablet 10 mg  10 mg Oral Daily Lenard Lance, FNP   10 mg at 06/22/22 0749   atorvastatin (LIPITOR) tablet 40 mg  40 mg Oral Daily Lenard Lance, FNP   40 mg at 06/23/22 0813    diphenhydrAMINE (BENADRYL) capsule 50 mg  50 mg Oral TID PRN Lenard Lance, FNP       Or   diphenhydrAMINE (BENADRYL) injection 50 mg  50 mg Intramuscular TID PRN Lenard Lance, FNP       DULoxetine (CYMBALTA) DR capsule 60 mg  60 mg Oral Daily Lenard Lance, FNP   60 mg at 06/23/22 0814   famotidine (PEPCID) tablet 10 mg  10 mg Oral BID Lenard Lance, FNP   10 mg at 06/23/22 0814   gabapentin (NEURONTIN) capsule 600 mg  600 mg Oral TID Lenard Lance, FNP   600 mg at 06/23/22 0814   glipiZIDE (GLUCOTROL XL) 24 hr tablet 10 mg  10 mg  Oral Q breakfast Lenard Lance, FNP   10 mg at 06/23/22 1610   haloperidol (HALDOL) tablet 5 mg  5 mg Oral TID PRN Lenard Lance, FNP       Or   haloperidol lactate (HALDOL) injection 5 mg  5 mg Intramuscular TID PRN Lenard Lance, FNP       insulin aspart (novoLOG) injection 0-9 Units  0-9 Units Subcutaneous TID WC Lenard Lance, FNP   5 Units at 06/22/22 1724   insulin glargine-yfgn (SEMGLEE) injection 30 Units  30 Units Subcutaneous QHS Lenard Lance, FNP   30 Units at 06/22/22 2211   loperamide (IMODIUM) capsule 4 mg  4 mg Oral PRN Rex Kras, MD   4 mg at 06/23/22 0813   LORazepam (ATIVAN) tablet 2 mg  2 mg Oral TID PRN Lenard Lance, FNP       Or   LORazepam (ATIVAN) injection 2 mg  2 mg Intramuscular TID PRN Lenard Lance, FNP       magnesium hydroxide (MILK OF MAGNESIA) suspension 30 mL  30 mL Oral Daily PRN Lenard Lance, FNP       metFORMIN (GLUCOPHAGE) tablet 1,000 mg  1,000 mg Oral BID WC Lenard Lance, FNP   1,000 mg at 06/23/22 0814   tamsulosin (FLOMAX) capsule 0.4 mg  0.4 mg Oral Daily Lenard Lance, FNP   0.4 mg at 06/23/22 9604   traZODone (DESYREL) tablet 100 mg  100 mg Oral QHS PRN Lenard Lance, FNP   100 mg at 06/22/22 2213    Lab Results:  Results for orders placed or performed during the hospital encounter of 06/19/22 (from the past 48 hour(s))  Glucose, capillary     Status: Abnormal   Collection Time: 06/21/22 12:09 PM  Result  Value Ref Range   Glucose-Capillary 271 (H) 70 - 99 mg/dL    Comment: Glucose reference range applies only to samples taken after fasting for at least 8 hours.  Glucose, capillary     Status: Abnormal   Collection Time: 06/21/22  4:21 PM  Result Value Ref Range   Glucose-Capillary 301 (H) 70 - 99 mg/dL    Comment: Glucose reference range applies only to samples taken after fasting for at least 8 hours.  Glucose, capillary     Status: Abnormal   Collection Time: 06/21/22  8:06 PM  Result Value Ref Range   Glucose-Capillary 154 (H) 70 - 99 mg/dL    Comment: Glucose reference range applies only to samples taken after fasting for at least 8 hours.  Glucose, capillary     Status: Abnormal   Collection Time: 06/22/22  6:22 AM  Result Value Ref Range   Glucose-Capillary 231 (H) 70 - 99 mg/dL    Comment: Glucose reference range applies only to samples taken after fasting for at least 8 hours.   Comment 1 Notify RN   Glucose, capillary     Status: Abnormal   Collection Time: 06/22/22 12:19 PM  Result Value Ref Range   Glucose-Capillary 182 (H) 70 - 99 mg/dL    Comment: Glucose reference range applies only to samples taken after fasting for at least 8 hours.  Glucose, capillary     Status: Abnormal   Collection Time: 06/22/22  4:39 PM  Result Value Ref Range   Glucose-Capillary 273 (H) 70 - 99 mg/dL    Comment: Glucose reference range applies only to samples taken after fasting for at least 8 hours.  Glucose, capillary     Status: Abnormal   Collection Time: 06/22/22  8:07 PM  Result Value Ref Range   Glucose-Capillary 116 (H) 70 - 99 mg/dL    Comment: Glucose reference range applies only to samples taken after fasting for at least 8 hours.  Glucose, capillary     Status: Abnormal   Collection Time: 06/23/22  6:13 AM  Result Value Ref Range   Glucose-Capillary 113 (H) 70 - 99 mg/dL    Comment: Glucose reference range applies only to samples taken after fasting for at least 8 hours.     Blood Alcohol level:  Lab Results  Component Value Date   ETH <10 06/19/2022   ETH <10 06/17/2022    Metabolic Disorder Labs: Lab Results  Component Value Date   HGBA1C 10.4 (H) 01/21/2022   MPG 251.78 01/21/2022   MPG >398 02/24/2021   Lab Results  Component Value Date   PROLACTIN 6.9 06/19/2022   Lab Results  Component Value Date   CHOL 213 (H) 05/23/2021   TRIG 115 05/23/2021   HDL 71 05/23/2021   CHOLHDL 3.2 11/03/2019   VLDL 32 (H) 05/03/2015   LDLCALC 122 (H) 05/23/2021   LDLCALC 99 11/03/2019    Physical Findings: AIMS:  , ,  ,  ,    CIWA:    COWS:     Musculoskeletal: Strength & Muscle Tone: within normal limits Gait & Station: normal Patient leans: N/A  Psychiatric Specialty Exam:  Presentation  General Appearance:  Casual  Eye Contact: Fair  Speech: Normal Rate  Speech Volume: Normal  Handedness: Right   Mood and Affect  Mood: Euthymic  Affect: Blunt; Congruent   Thought Process  Thought Processes: Linear  Descriptions of Associations:Intact  Orientation:Full (Time, Place and Person)  Thought Content:WDL  History of Schizophrenia/Schizoaffective disorder:No  Duration of Psychotic Symptoms:No data recorded Hallucinations:No data recorded  Ideas of Reference:None  Suicidal Thoughts:No data recorded  Homicidal Thoughts:No data recorded   Sensorium  Memory: Immediate Fair; Recent Fair  Judgment: Fair  Insight: Fair; Shallow   Executive Functions  Concentration: Fair  Attention Span: Fair  Recall: [employer] of Knowledge: Fair  Language: Fair   Psychomotor Activity  Psychomotor Activity: No data recorded   Assets  Assets: Communication Skills; Resilience   Sleep  Sleep: No data recorded    Physical Exam: Physical Exam Vitals and nursing note reviewed.  Constitutional:      Appearance: He is normal weight.  Neurological:     Mental Status: He is alert.  Psychiatric:         Attention and Perception: He does not perceive auditory or visual hallucinations.        Mood and Affect: Mood and affect normal.        Speech: Speech normal.        Behavior: Behavior is withdrawn. Behavior is cooperative.        Thought Content: Thought content is not paranoid or delusional. Thought content does not include homicidal or suicidal ideation. Thought content does not include homicidal or suicidal plan.    Review of Systems  Psychiatric/Behavioral:  Positive for substance abuse.    Blood pressure 100/60, pulse 99, temperature 98.5 F (36.9 C), temperature source Oral, resp. rate 18, height 5\' 8"  (1.727 m), weight 74.8 kg, SpO2 96 %. Body mass index is 25.07 kg/m.   Treatment Plan Summary: Daily contact with patient to assess and evaluate symptoms and progress in treatment, Medication management, and  Plan   PLAN:  Safety and Monitoring:             --  Voluntary admission to inpatient psychiatric unit for safety, stabilization and treatment             -- Daily contact with patient to assess and evaluate symptoms and progress in treatment             -- Patient's case to be discussed in multi-disciplinary team meeting             -- Observation Level : q15 minute checks             -- Vital signs:  q12 hours             -- Precautions: suicide, elopement, and assault   2. Psychiatric Diagnoses and Treatment:   Medications:  Continue:       -- Cymbalta DR 60 mg PO daily  --  The risks/benefits/side-effects/alternatives to this medication were discussed in detail with the patient and time was given for questions. The patient consents to medication trial.  -- FDA started home medications that included duloxetine 60 mg a day and gabapentin 603 times a day.             -- Metabolic profile and EKG monitoring obtained while on an atypical antipsychotic (BMI: Lipid Panel: HbgA1c: QTc:) as indicated             -- Encouraged patient to participate in unit milieu and in  scheduled group therapies              -- Short Term Goals: Ability to identify changes in lifestyle to reduce recurrence of condition will improve, Ability to verbalize feelings will improve, Ability to disclose and discuss suicidal ideas, Ability to demonstrate self-control will improve, Ability to identify and develop effective coping behaviors will improve, and Ability to maintain clinical measurements within normal limits will improve             -- Long Term Goals: Improvement in symptoms so as ready for discharge                3. Medical Issues Being Addressed:              Tobacco Use Disorder             -- Nicotine patch 21mg /24 hours  ordered             -- Smoking cessation encouraged  -- Amlodipine 10 mg PO daily HTN  -- Atorvastatin 40 mg PO daily  -- Famotidine 10 mg PO BID  -- Gabapentin 600 mg TID   -- Glipizide 10 mg daily with  breakfast  -- Insulin Aspart 0-9 units SQ TID  with meals  -- Insulin Glargine-yfgn SQ 30 units  daily HS  -- Metformin 1000 mg BID with meals  -- Tamsulosin 0.4 mg PO daily    4. Discharge Planning:              -- Social work and case management to assist with discharge planning and identification of hospital follow-up needs prior to discharge             -- Estimated LOS: 5-7 days             -- Discharge Concerns: Need to establish a safety plan; Medication compliance and effectiveness             -- Discharge Goals: Return  home with outpatient referrals for mental health follow-up including medication management/psychotherapy    I certify that inpatient services furnished can reasonably be expected to improve the patient's condition.     Loletta Parish, NP 06/23/2022, 8:56 AMPatient ID: David Wyatt, male   DOB: 09-19-1960, 62 y.o.   MRN: [id number]

## 2022-06-23 NOTE — Progress Notes (Addendum)
Patient at times irritable but overall cooperative and med compliant. Patient cleared by lab results but still symptomatic reporting diarrhea and recently vomiting. Patient provided with gatorade to maintain hydration and frozen popsicle for snack. Blood sugar level WDL (85). Patient received tylenol and imodium po prn. Patient remaining in room resting. A&Ox4. Denies SI,HI, and A/V/H with no plan or intent. No current distress.

## 2022-06-23 NOTE — ED Notes (Signed)
Patient coming from [facility]. Nausea, vomiting, diarrhea x2 days.

## 2022-06-23 NOTE — Progress Notes (Signed)
Patient transported off unit by non-emergent EMS. Report provided to EMS and [facility] ED charge nurse. Tech by patient side.

## 2022-06-24 DIAGNOSIS — F332 Major depressive disorder, recurrent severe without psychotic features: Secondary | ICD-10-CM | POA: Diagnosis not present

## 2022-06-24 LAB — GLUCOSE, CAPILLARY
Glucose-Capillary: 106 mg/dL — ABNORMAL HIGH (ref 70–99)
Glucose-Capillary: 152 mg/dL — ABNORMAL HIGH (ref 70–99)
Glucose-Capillary: 155 mg/dL — ABNORMAL HIGH (ref 70–99)
Glucose-Capillary: 156 mg/dL — ABNORMAL HIGH (ref 70–99)
Glucose-Capillary: 170 mg/dL — ABNORMAL HIGH (ref 70–99)
Glucose-Capillary: 177 mg/dL — ABNORMAL HIGH (ref 70–99)
Glucose-Capillary: 59 mg/dL — ABNORMAL LOW (ref 70–99)
Glucose-Capillary: 76 mg/dL (ref 70–99)
Glucose-Capillary: 80 mg/dL (ref 70–99)

## 2022-06-24 MED ORDER — GLUCOSE 4 G PO CHEW
1.0000 | CHEWABLE_TABLET | Freq: Once | ORAL | Status: DC
  Filled 2022-06-24: qty 1

## 2022-06-24 MED ORDER — GLUCOSE 4 G PO CHEW
CHEWABLE_TABLET | ORAL | Status: AC
  Filled 2022-06-24: qty 1

## 2022-06-24 NOTE — Progress Notes (Signed)
The patient attended the evening A.A.meeting and shared appropriately.

## 2022-06-24 NOTE — Group Note (Signed)
Recreation Therapy Group Note   Group Topic:Team Building  Group Date: 06/24/2022 Start Time: 0933 End Time: 1010 Facilitators: Verneice Caspers-McCall, LRT,CTRS Location: [address] Dayroom   Goal Area(s) Addresses:  Patient will effectively work with peer towards shared goal.  Patient will identify skills used to make activity successful.  Patient will identify how skills used during activity can be used to reach post d/c goals.   Group Description: Straw Bridge. In teams of 3-5, patients were given 15 plastic drinking straws and an equal length of masking tape. Using the materials provided, patients were instructed to build a free standing bridge-like structure to suspend an everyday item (ex: puzzle box) off of the floor or table surface. All materials were required to be used by the team in their design. LRT facilitated post-activity discussion reviewing team process. Patients were encouraged to reflect how the skills used in this activity can be generalized to daily life post discharge.    Affect/Mood: N/A   Participation Level: Did not attend    Clinical Observations/Individualized Feedback:     Plan: Continue to engage patient in RT group sessions 2-3x/week.   Manaal Mandala-McCall, LRT,CTRS 06/24/2022 12:29 PM

## 2022-06-24 NOTE — Progress Notes (Signed)
[facility] MD Progress Note  06/24/2022 3:22 PM David Wyatt  MRN:  [id number] Subjective:   Principal Problem: MDD (major depressive disorder), recurrent severe, without psychosis Diagnosis: Principal Problem:   MDD (major depressive disorder), recurrent severe, without psychosis  HPI: David Wyatt is a 62 year old African-American male with past psychiatric history of cocaine use disorder, major depressive disorder recurrent severe without psychosis who initially presented to [facility] voluntarily as a walk-in via law enforcement from [facility] for suicidal ideations. Patient was admitted via the [facility] and recommended for inpatient hospitalization; he was transferred to [facility] 06/19/2022.   24 hour chart review: Patient has remained in his room on Precautions after having multiple instances of nausea, vomiting and diarrhea. He has not been attending any groups and has received meals in his room.  Patient was sent to [facility], ED for GI discomfort with diarrhea and nausea/vomiting.  He was evaluated and returned with results of negative GI pathogens.  Able to tolerate breakfast and lunch and p.o. fluids without nausea, vomiting or diarrhea.  Reports sleeping over 4 to 5 hours after return from the ED last night.  CBG ranging from 59-177 today.  Diabetic consultant aware.  Per chart review, patient had reactive HCV test 06/07/22.   Assessment: Patient is seen and evaluated lying in his bed. He presents alert, oriented to person, place, time, and situation.  Reported having a shower today.  David Wyatt reports that he was sent to the hospital yesterday around 5 PM for complaint of GI bug.  At the hospital he was evaluated and was sent [facility] due to negative GI pathogen.  Reports he has not experience nausea, vomiting, or diarrhea since return from the hospital last night.  He reports that he consumes all his breakfast and lunch and tolerating meals well without vomiting. Reports recently being  sick at [facility]. Today he denies any active SI/HI/AVH;  There is no evidence of delusional or paranoid thoughts at this time. He contracts for safety on the unit.  He denies any safety concerns at this time.  Will continue treatment plan detailed below, and encouraged patient to increase his p.o. intake and report to nursing staff if nausea vomiting diarrhea reoccurs.  Total Time spent with patient: 45 minutes  Past Psychiatric History: depression and alcohol use disorder, crack cocaine use disorder   Past Medical History:  Past Medical History:  Diagnosis Date   Angioedema of lips 07/28/2012   left upper (07/29/2012)   Arthritis    hands and back   Chronic back pain    Cocaine abuse    Diabetic neuropathy    High cholesterol    Hypertension    Neuropathy    Type II diabetes mellitus     Past Surgical History:  Procedure Laterality Date   BACK SURGERY     CYSTOSCOPY W/ URETERAL STENT PLACEMENT Right 02/23/2021   Procedure: CYSTOSCOPY WITH RETROGRADE PYELOGRAM/URETERAL STENT PLACEMENT;  Surgeon: Jannifer Hick, MD;  Location: [facility] ORS;  Service: Urology;  Laterality: Right;   HERNIA REPAIR Right 04/01/2012   I & D EXTREMITY Left 12/13/2012   Procedure: IRRIGATION AND DEBRIDEMENT LEFT THUMB;  Surgeon: Tami Ribas, MD;  Location: [facility] OR;  Service: Orthopedics;  Laterality: Left;   INCISION AND DRAINAGE OF WOUND     boil on back/notes 07/14/2008 (07/29/2012)   INGUINAL HERNIA REPAIR  04/01/2012   Procedure: HERNIA REPAIR INGUINAL ADULT;  Surgeon: Currie Paris, MD;  Location: [facility] OR;  Service: General;  Laterality: Right;   INGUINAL HERNIA REPAIR Left 09/06/2016   Procedure: OPEN REPAIR LEFT INGUINAL HERNIA;  Surgeon: Gaynelle Adu, MD;  Location: [facility] OR;  Service: General;  Laterality: Left;   INSERTION OF MESH Left 09/06/2016   Procedure: INSERTION OF MESH;  Surgeon: Gaynelle Adu, MD;  Location: [facility] OR;  Service: General;  Laterality: Left;   TONSILLECTOMY     Family History:  Family History   Problem Relation Age of Onset   Diabetes Mother    Kidney disease Mother    Hyperlipidemia Mother    Hyperlipidemia Father    Diabetes Father    Family Psychiatric  History: see H&P Social History:  Social History   Substance and Sexual Activity  Alcohol Use No   Alcohol/week: 0.0 standard drinks of alcohol     Social History   Substance and Sexual Activity  Drug Use Not Currently   Types: "Crack" cocaine    Social History   Socioeconomic History   Marital status: Divorced    Spouse name: Not on file   Number of children: 3   Years of education: 12   Highest education level: Not on file  Occupational History    Comment: disabled  Tobacco Use   Smoking status: Every Day    Packs/day: 0.25    Years: 30.00    Additional pack years: 0.00    Total pack years: 7.50    Types: Cigarettes   Smokeless tobacco: Former    Quit date: 08/15/2015   Tobacco comments:    04/29/16 2  cigs daily, 10/27/17 sometimes < .25 PPD  Vaping Use   Vaping Use: Never used  Substance and Sexual Activity   Alcohol use: No    Alcohol/week: 0.0 standard drinks of alcohol   Drug use: Not Currently    Types: "Crack" cocaine   Sexual activity: Not on file  Other Topics Concern   Not on file  Social History Narrative   04/29/17   Lives in shelter   Caffeine- a lot of  tea, coffee   Social Determinants of Health   Financial Resource Strain: Not on file  Food Insecurity: Food Insecurity Present (06/20/2022)   Hunger Vital Sign    Worried About Running Out of Food in the Last Year: Sometimes true    Ran Out of Food in the Last Year: Sometimes true  Transportation Needs: Unmet Transportation Needs (06/20/2022)   PRAPARE - Administrator, Civil Service (Medical): Yes    Lack of Transportation (Non-Medical): Patient declined  Physical Activity: Not on file  Stress: Not on file  Social Connections: Not on file   Additional Social History:    Sleep: Good  Appetite:  Good  Current  Medications: Current Facility-Administered Medications  Medication Dose Route Frequency Provider Last Rate Last Admin   acetaminophen (TYLENOL) tablet 650 mg  650 mg Oral Q6H PRN Lenard Lance, FNP   650 mg at 06/23/22 0813   alum & mag hydroxide-simeth (MAALOX/MYLANTA) 200-200-20 MG/5ML suspension 30 mL  30 mL Oral Q4H PRN Lenard Lance, FNP       amLODipine (NORVASC) tablet 10 mg  10 mg Oral Daily Lenard Lance, FNP   10 mg at 06/24/22 0848   atorvastatin (LIPITOR) tablet 40 mg  40 mg Oral Daily Lenard Lance, FNP   40 mg at 06/24/22 0848   diphenhydrAMINE (BENADRYL) capsule 50 mg  50 mg Oral TID PRN Lenard Lance, FNP  Or   diphenhydrAMINE (BENADRYL) injection 50 mg  50 mg Intramuscular TID PRN Lenard Lance, FNP       DULoxetine (CYMBALTA) DR capsule 60 mg  60 mg Oral Daily Lenard Lance, FNP   60 mg at 06/24/22 0848   famotidine (PEPCID) tablet 10 mg  10 mg Oral BID Lenard Lance, FNP   10 mg at 06/24/22 0848   gabapentin (NEURONTIN) capsule 600 mg  600 mg Oral TID Lenard Lance, FNP   600 mg at 06/24/22 1207   glipiZIDE (GLUCOTROL XL) 24 hr tablet 10 mg  10 mg Oral Q breakfast Lenard Lance, FNP   10 mg at 06/24/22 0848   glucose chewable tablet 4 g  1 tablet Oral Once [place], Ene A, NP       haloperidol (HALDOL) tablet 5 mg  5 mg Oral TID PRN Lenard Lance, FNP       Or   haloperidol lactate (HALDOL) injection 5 mg  5 mg Intramuscular TID PRN Lenard Lance, FNP       insulin aspart (novoLOG) injection 0-9 Units  0-9 Units Subcutaneous TID WC Lenard Lance, FNP   2 Units at 06/24/22 1211   insulin glargine-yfgn (SEMGLEE) injection 30 Units  30 Units Subcutaneous QHS Lenard Lance, FNP   30 Units at 06/23/22 2226   loperamide (IMODIUM) capsule 4 mg  4 mg Oral PRN Rex Kras, MD   4 mg at 06/23/22 0813   LORazepam (ATIVAN) tablet 2 mg  2 mg Oral TID PRN Lenard Lance, FNP       Or   LORazepam (ATIVAN) injection 2 mg  2 mg Intramuscular TID PRN Lenard Lance, FNP        magnesium hydroxide (MILK OF MAGNESIA) suspension 30 mL  30 mL Oral Daily PRN Lenard Lance, FNP       metFORMIN (GLUCOPHAGE) tablet 1,000 mg  1,000 mg Oral BID WC Lenard Lance, FNP   1,000 mg at 06/24/22 0848   ondansetron (ZOFRAN-ODT) disintegrating tablet 4 mg  4 mg Oral Q6H PRN Charlynne Pander, MD       tamsulosin [facility]) capsule 0.4 mg  0.4 mg Oral Daily Lenard Lance, FNP   0.4 mg at 06/24/22 0848   traZODone (DESYREL) tablet 100 mg  100 mg Oral QHS PRN Lenard Lance, FNP   100 mg at 06/23/22 2226    Lab Results:  Results for orders placed or performed during the hospital encounter of 06/19/22 (from the past 48 hour(s))  Glucose, capillary     Status: Abnormal   Collection Time: 06/22/22  4:39 PM  Result Value Ref Range   Glucose-Capillary 273 (H) 70 - 99 mg/dL    Comment: Glucose reference range applies only to samples taken after fasting for at least 8 hours.  Gastrointestinal Panel by PCR , Stool     Status: None   Collection Time: 06/22/22  4:40 PM   Specimen: Stool  Result Value Ref Range   Campylobacter species NOT DETECTED NOT DETECTED   Plesimonas shigelloides NOT DETECTED NOT DETECTED   Salmonella species NOT DETECTED NOT DETECTED   Yersinia enterocolitica NOT DETECTED NOT DETECTED   Vibrio species NOT DETECTED NOT DETECTED   Vibrio cholerae NOT DETECTED NOT DETECTED   Enteroaggregative E coli (EAEC) NOT DETECTED NOT DETECTED   Enteropathogenic E coli (EPEC) NOT DETECTED NOT DETECTED   Enterotoxigenic E coli (ETEC) NOT DETECTED NOT DETECTED  Shiga like toxin producing E coli (STEC) NOT DETECTED NOT DETECTED   Shigella/Enteroinvasive E coli (EIEC) NOT DETECTED NOT DETECTED   Cryptosporidium NOT DETECTED NOT DETECTED   Cyclospora cayetanensis NOT DETECTED NOT DETECTED   Entamoeba histolytica NOT DETECTED NOT DETECTED   Giardia lamblia NOT DETECTED NOT DETECTED   Adenovirus F40/41 NOT DETECTED NOT DETECTED   Astrovirus NOT DETECTED NOT DETECTED   Norovirus GI/GII  NOT DETECTED NOT DETECTED   Rotavirus A NOT DETECTED NOT DETECTED   Sapovirus (I, II, IV, and V) NOT DETECTED NOT DETECTED    Comment: Performed at [facility], [address] [place], [place] [address]  Glucose, capillary     Status: Abnormal   Collection Time: 06/22/22  8:07 PM  Result Value Ref Range   Glucose-Capillary 116 (H) 70 - 99 mg/dL    Comment: Glucose reference range applies only to samples taken after fasting for at least 8 hours.  Glucose, capillary     Status: Abnormal   Collection Time: 06/23/22  6:13 AM  Result Value Ref Range   Glucose-Capillary 113 (H) 70 - 99 mg/dL    Comment: Glucose reference range applies only to samples taken after fasting for at least 8 hours.  Glucose, capillary     Status: None   Collection Time: 06/23/22 12:15 PM  Result Value Ref Range   Glucose-Capillary 85 70 - 99 mg/dL    Comment: Glucose reference range applies only to samples taken after fasting for at least 8 hours.  TSH     Status: Abnormal   Collection Time: 06/23/22  5:25 PM  Result Value Ref Range   TSH 0.321 (L) 0.350 - 4.500 uIU/mL    Comment: Performed by a 3rd Generation assay with a functional sensitivity of <=0.01 uIU/mL. Performed at [facility], [address], [place], [place] [address]   Hemoglobin A1c     Status: Abnormal   Collection Time: 06/23/22  5:25 PM  Result Value Ref Range   Hgb A1c MFr Bld 11.4 (H) 4.8 - 5.6 %    Comment: (NOTE) Pre diabetes:          5.7%-6.4%  Diabetes:              >6.4%  Glycemic control for   <7.0% adults with diabetes    Mean Plasma Glucose 280.48 mg/dL    Comment: Performed at [facility], [address] [place], [place] [address]  CBC with Differential     Status: Abnormal   Collection Time: 06/23/22  5:25 PM  Result Value Ref Range   WBC 13.0 (H) 4.0 - 10.5 K/uL   RBC 4.30 4.22 - 5.81 MIL/uL   Hemoglobin 12.7 (L) 13.0 - 17.0 g/dL   HCT 21.3 08.6 - 57.8 %   MCV 95.3 80.0 - 100.0  fL   MCH 29.5 26.0 - 34.0 pg   MCHC 31.0 30.0 - 36.0 g/dL   RDW 46.9 62.9 - 52.8 %   Platelets 475 (H) 150 - 400 K/uL   nRBC 0.0 0.0 - 0.2 %   Neutrophils Relative % 70 %   Neutro Abs 9.1 (H) 1.7 - 7.7 K/uL   Lymphocytes Relative 17 %   Lymphs Abs 2.2 0.7 - 4.0 K/uL   Monocytes Relative 8 %   Monocytes Absolute 1.1 (H) 0.1 - 1.0 K/uL   Eosinophils Relative 4 %   Eosinophils Absolute 0.5 0.0 - 0.5 K/uL   Basophils Relative 0 %  Basophils Absolute 0.0 0.0 - 0.1 K/uL   Immature Granulocytes 1 %   Abs Immature Granulocytes 0.06 0.00 - 0.07 K/uL    Comment: Performed at [facility], [address] [place], [place] [address]  Comprehensive metabolic panel     Status: Abnormal   Collection Time: 06/23/22  5:25 PM  Result Value Ref Range   Sodium 136 135 - 145 mmol/L   Potassium 4.3 3.5 - 5.1 mmol/L   Chloride 105 98 - 111 mmol/L   CO2 21 (L) 22 - 32 mmol/L   Glucose, Bld 109 (H) 70 - 99 mg/dL    Comment: Glucose reference range applies only to samples taken after fasting for at least 8 hours.   BUN 25 (H) 8 - 23 mg/dL   Creatinine, Ser 0.10 0.61 - 1.24 mg/dL   Calcium 8.6 (L) 8.9 - 10.3 mg/dL   Total Protein 7.6 6.5 - 8.1 g/dL   Albumin 3.2 (L) 3.5 - 5.0 g/dL   AST 17 15 - 41 U/L   ALT 16 0 - 44 U/L   Alkaline Phosphatase 79 38 - 126 U/L   Total Bilirubin 1.0 0.3 - 1.2 mg/dL   GFR, Estimated >27 >25 mL/min    Comment: (NOTE) Calculated using the CKD-EPI Creatinine Equation (2021)    Anion gap 10 5 - 15    Comment: Performed at [facility], [address] [place], [place] [address]  Glucose, capillary     Status: None   Collection Time: 06/23/22  5:28 PM  Result Value Ref Range   Glucose-Capillary 94 70 - 99 mg/dL    Comment: Glucose reference range applies only to samples taken after fasting for at least 8 hours.  C Difficile Quick Screen w PCR reflex     Status: None   Collection Time: 06/23/22  7:15 PM   Specimen: STOOL  Result  Value Ref Range   C Diff antigen NEGATIVE NEGATIVE   C Diff toxin NEGATIVE NEGATIVE   C Diff interpretation No C. difficile detected.     Comment: Performed at [facility], [address], [place], [place] [address]  Glucose, capillary     Status: None   Collection Time: 06/23/22 10:10 PM  Result Value Ref Range   Glucose-Capillary 80 70 - 99 mg/dL    Comment: Glucose reference range applies only to samples taken after fasting for at least 8 hours.   Comment 1 Repeat Test   Glucose, capillary     Status: None   Collection Time: 06/24/22 12:25 AM  Result Value Ref Range   Glucose-Capillary 76 70 - 99 mg/dL    Comment: Glucose reference range applies only to samples taken after fasting for at least 8 hours.   Comment 1 Notify RN    Comment 2 Document in Chart   Glucose, capillary     Status: Abnormal   Collection Time: 06/24/22  5:05 AM  Result Value Ref Range   Glucose-Capillary 59 (L) 70 - 99 mg/dL    Comment: Glucose reference range applies only to samples taken after fasting for at least 8 hours.  Glucose, capillary     Status: Abnormal   Collection Time: 06/24/22  5:53 AM  Result Value Ref Range   Glucose-Capillary 106 (H) 70 - 99 mg/dL    Comment: Glucose reference range applies only to samples taken after fasting for at least 8 hours.   Comment 1 Notify RN    Comment 2  Document in Chart   Glucose, capillary     Status: Abnormal   Collection Time: 06/24/22  8:14 AM  Result Value Ref Range   Glucose-Capillary 170 (H) 70 - 99 mg/dL    Comment: Glucose reference range applies only to samples taken after fasting for at least 8 hours.  Glucose, capillary     Status: Abnormal   Collection Time: 06/24/22 11:41 AM  Result Value Ref Range   Glucose-Capillary 177 (H) 70 - 99 mg/dL    Comment: Glucose reference range applies only to samples taken after fasting for at least 8 hours.  Glucose, capillary     Status: Abnormal   Collection Time: 06/24/22 11:53 AM   Result Value Ref Range   Glucose-Capillary 156 (H) 70 - 99 mg/dL    Comment: Glucose reference range applies only to samples taken after fasting for at least 8 hours.    Blood Alcohol level:  Lab Results  Component Value Date   ETH <10 06/19/2022   ETH <10 06/17/2022    Metabolic Disorder Labs: Lab Results  Component Value Date   HGBA1C 11.4 (H) 06/23/2022   MPG 280.48 06/23/2022   MPG 251.78 01/21/2022   Lab Results  Component Value Date   PROLACTIN 6.9 06/19/2022   Lab Results  Component Value Date   CHOL 213 (H) 05/23/2021   TRIG 115 05/23/2021   HDL 71 05/23/2021   CHOLHDL 3.2 11/03/2019   VLDL 32 (H) 05/03/2015   LDLCALC 122 (H) 05/23/2021   LDLCALC 99 11/03/2019    Physical Findings: AIMS:  , ,  ,  ,    CIWA:    COWS:     Musculoskeletal: Strength & Muscle Tone: within normal limits Gait & Station: normal Patient leans: N/A  Psychiatric Specialty Exam:  Presentation  General Appearance:  Casual  Eye Contact: Fair  Speech: Clear and Coherent  Speech Volume: Normal  Handedness: Right   Mood and Affect  Mood: Depressed  Affect: Blunt; Congruent   Thought Process  Thought Processes: Linear  Descriptions of Associations:Intact  Orientation:Full (Time, Place and Person)  Thought Content:WDL  History of Schizophrenia/Schizoaffective disorder:No  Duration of Psychotic Symptoms:No data recorded Hallucinations:Hallucinations: None   Ideas of Reference:None  Suicidal Thoughts:Suicidal Thoughts: No SI Active Intent and/or Plan: -- (denies) SI Passive Intent and/or Plan: -- (denies)   Homicidal Thoughts:Homicidal Thoughts: No    Sensorium  Memory: Immediate Fair; Recent Fair  Judgment: Fair  Insight: Fair   Art therapist  Concentration: Fair  Attention Span: Fair  Recall: [employer] of Knowledge: Fair  Language: Fair   Psychomotor Activity  Psychomotor Activity: Psychomotor Activity:  Normal (Ambulates with a rolling walker.)    Assets  Assets: Communication Skills; Resilience; Physical Health   Sleep  Sleep: Sleep: Fair Number of Hours of Sleep: 4     Physical Exam: Physical Exam Vitals and nursing note reviewed.  Constitutional:      Appearance: He is normal weight.  HENT:     Head: Normocephalic.     Nose: Nose normal.     Mouth/Throat:     Mouth: Mucous membranes are moist.     Pharynx: Oropharynx is clear.  Eyes:     Conjunctiva/sclera: Conjunctivae normal.     Pupils: Pupils are equal, round, and reactive to light.  Cardiovascular:     Rate and Rhythm: Tachycardia present.     Comments: Blood pressure 146/76, pulse 114.  Nursing staff to recheck vital signs. Pulmonary:  Effort: Pulmonary effort is normal.  Genitourinary:    Comments: deferred Musculoskeletal:        General: Normal range of motion.     Cervical back: Normal range of motion.  Skin:    General: Skin is warm.  Neurological:     General: No focal deficit present.     Mental Status: He is alert and oriented to person, place, and time.  Psychiatric:        Attention and Perception: He does not perceive auditory or visual hallucinations.        Mood and Affect: Mood and affect normal.        Speech: Speech normal.        Behavior: Behavior is withdrawn. Behavior is cooperative.        Thought Content: Thought content is not paranoid or delusional. Thought content does not include homicidal or suicidal ideation. Thought content does not include homicidal or suicidal plan.    Review of Systems  Constitutional: Negative.   HENT: Negative.    Respiratory: Negative.    Cardiovascular: Negative.        Blood pressure 146/76, pulse 114.  Nursing staff to recheck vital signs  Musculoskeletal: Negative.   Skin: Negative.   Neurological: Negative.   Endo/Heme/Allergies: Negative.   Psychiatric/Behavioral:  Positive for substance abuse. The patient is nervous/anxious and has  insomnia.    Blood pressure (!) 146/76, pulse (!) 114, temperature 98.2 F (36.8 C), temperature source Oral, resp. rate 15, height 5\' 8"  (1.727 m), weight 75 kg, SpO2 99 %. Body mass index is 25.14 kg/m.   Treatment Plan Summary: Daily contact with patient to assess and evaluate symptoms and progress in treatment, Medication management, and Plan   PLAN:  Safety and Monitoring:             --  Voluntary admission to inpatient psychiatric unit for safety, stabilization and treatment             -- Daily contact with patient to assess and evaluate symptoms and progress in treatment             -- Patient's case to be discussed in multi-disciplinary team meeting             -- Observation Level : q15 minute checks             -- Vital signs:  q12 hours             -- Precautions: suicide, elopement, and assault   2. Psychiatric Diagnoses and Treatment:   Medications:  Continue:       -- Cymbalta DR 60 mg PO daily  --  The risks/benefits/side-effects/alternatives to this medication were discussed in detail with the patient and time was given for questions. The patient consents to medication trial.  -- FDA started home medications that included duloxetine 60 mg a day and gabapentin 603 times a day.             -- Metabolic profile and EKG monitoring obtained while on an atypical antipsychotic (BMI: Lipid Panel: HbgA1c: QTc:) as indicated             -- Encouraged patient to participate in unit milieu and in scheduled group therapies              -- Short Term Goals: Ability to identify changes in lifestyle to reduce recurrence of condition will improve, Ability to verbalize feelings will improve, Ability to disclose and discuss suicidal  ideas, Ability to demonstrate self-control will improve, Ability to identify and develop effective coping behaviors will improve, and Ability to maintain clinical measurements within normal limits will improve             -- Long Term Goals: Improvement in  symptoms so as ready for discharge                3. Medical Issues Being Addressed:              Tobacco Use Disorder             -- Nicotine patch /24 hours  ordered             -- Smoking cessation encouraged  -- Amlodipine 10 mg PO daily HTN  -- Atorvastatin 40 mg PO daily  -- Famotidine 10 mg PO BID  -- Gabapentin 600 mg TID   -- Glipizide 10 mg daily with  breakfast  -- Insulin Aspart 0-9 units SQ TID  with meals  -- Insulin Glargine-yfgn SQ 30 units  daily HS  -- Metformin 1000 mg BID with meals  -- Tamsulosin 0.4 mg PO daily    4. Discharge Planning:              -- Social work and case management to assist with discharge planning and identification of hospital follow-up needs prior to discharge             -- Estimated LOS: 5-7 days             -- Discharge Concerns: Need to establish a safety plan; Medication compliance and effectiveness             -- Discharge Goals: Return home with outpatient referrals for mental health follow-up including medication management/psychotherapy    I certify that inpatient services furnished can reasonably be expected to improve the patient's condition.     Cecilie Lowers, FNP 06/24/2022, 3:22 PMPatient ID: David Wyatt, male   DOB: 24-Aug-1960, 62 y.o.   MRN: [id number] Patient ID: David Wyatt, male   DOB: [birth year], 62 y.o.   MRN: [id number]

## 2022-06-24 NOTE — Progress Notes (Addendum)
Pt denied SI/HI/AVH this morning. Pt reports that he is no longer having diarrhea episodes and is feeling a lot better. Pt's BS increased today since hypoglycemic episode this morning. Pt has remained in his room for most of the day. Pt has been pleasant, calm, and cooperative throughout the shift. RN provided support and encouragement to patient. Pt given scheduled medications as prescribed. Q15 min checks verified for safety. Patient verbally contracts for safety. Patient compliant with medications and treatment plan. Pt is safe on the unit.   06/24/22 0900  Psych Admission Type (Psych Patients Only)  Admission Status Voluntary  Psychosocial Assessment  Patient Complaints None  Eye Contact Fair  Facial Expression Flat  Affect Appropriate to circumstance  Speech Logical/coherent  Interaction Assertive  Motor Activity Slow  Appearance/Hygiene Disheveled  Behavior Characteristics Cooperative  Mood Pleasant  Thought Process  Coherency WDL  Content WDL  Delusions None reported or observed  Perception WDL  Hallucination None reported or observed  Judgment Poor  Confusion None  Danger to Self  Current suicidal ideation? Denies  Description of Suicide Plan No plan  Agreement Not to Harm Self Yes  Description of Agreement Pt verbally contracs for safety  Danger to Others  Danger to Others None reported or observed

## 2022-06-24 NOTE — Progress Notes (Signed)
Pt blood sugar drop to 59 mg/dl at  around 1607 am. Pt was treated with 4 mg of glucose chewable tablet as ordered, rechecked about an hour later and it came up to 106 mg/dl. Will continue to monitor.

## 2022-06-24 NOTE — Progress Notes (Signed)
  Wyatt, David A, Chaplain  David Wyatt, Chaplain Spiritual care group on grief and loss facilitated by Chaplain David Wyatt, Bcc and Chaplain David Wyatt   Group Goal: Support / Education around grief and loss   Members engage in facilitated group support and psycho-social education.   Group Description:   Following introductions and group rules, group members engaged in facilitated group dialogue and support around topic of loss, with particular support around experiences of loss in their lives. Group Identified types of loss (relationships / self / things) and identified patterns, circumstances, and changes that precipitate losses. Reflected on thoughts / feelings around loss, normalized grief responses, and recognized variety in grief experience. Group encouraged individual reflection on safe space and on the coping skills that they are already utilizing.   Group drew on Adlerian / Rogerian and narrative framework   Patient Progress: Patient did not attend group.       David Wyatt Chaplain     

## 2022-06-24 NOTE — Inpatient Diabetes Management (Signed)
Inpatient Diabetes Program Recommendations  AACE/ADA: New Consensus Statement on Inpatient Glycemic Control (2015)  Target Ranges:  Prepandial:   less than 140 mg/dL      Peak postprandial:   less than 180 mg/dL (1-2 hours)      Critically ill patients:  140 - 180 mg/dL   Lab Results  Component Value Date   GLUCAP 170 (H) 06/24/2022   HGBA1C 11.4 (H) 06/23/2022    Review of Glycemic Control  Latest Reference Range & Units 06/23/22 06:13 06/23/22 12:15 06/23/22 17:28 06/23/22 22:10 06/24/22 00:25 06/24/22 05:05 06/24/22 05:53 06/24/22 08:14  Glucose-Capillary 70 - 99 mg/dL 161 (H) 85 94 80 76 59 (L) 106 (H) 170 (H)   Diabetes history: DM 2 Outpatient Diabetes medications: Lantus 30 units daily, Humalog 2 units at lunch and supper  Current orders for Inpatient glycemic control:  Semglee 30 units qhs Novolog 0-9 units tid  Glipizide 10 mg Daily Metformin 1000 mg bid  Inpatient Diabetes Program Recommendations:    Note hypoglycemia of 59 this am. Pt on Glipizide, a sulfonylurea known to cause hypoglycemia.   -   Consider d/cing Glipizide and watch glucose trends closely.  Thanks,  Christena Deem RN, MSN, BC-ADM Inpatient Diabetes Coordinator Team Pager [phone] (8a-5p)

## 2022-06-24 NOTE — Progress Notes (Signed)
   06/24/22 2058  Psych Admission Type (Psych Patients Only)  Admission Status Voluntary  Psychosocial Assessment  Patient Complaints None  Eye Contact Fair  Facial Expression Animated  Affect Appropriate to circumstance  Speech Logical/coherent  Interaction Assertive  Motor Activity Slow  Appearance/Hygiene Unremarkable  Behavior Characteristics Cooperative;Appropriate to situation  Mood Pleasant  Thought Process  Coherency WDL  Content WDL  Delusions None reported or observed  Perception WDL  Hallucination None reported or observed  Judgment Impaired  Confusion None  Danger to Self  Current suicidal ideation? Denies  Agreement Not to Harm Self Yes  Description of Agreement verbal  Danger to Others  Danger to Others None reported or observed

## 2022-06-25 DIAGNOSIS — F332 Major depressive disorder, recurrent severe without psychotic features: Secondary | ICD-10-CM | POA: Diagnosis not present

## 2022-06-25 LAB — GLUCOSE, CAPILLARY
Glucose-Capillary: 117 mg/dL — ABNORMAL HIGH (ref 70–99)
Glucose-Capillary: 197 mg/dL — ABNORMAL HIGH (ref 70–99)
Glucose-Capillary: 98 mg/dL (ref 70–99)
Glucose-Capillary: 228 mg/dL — ABNORMAL HIGH (ref 70–99)

## 2022-06-25 NOTE — [facility] Group Notes (Signed)
Adult Psychoeducational Group Note  Date:  06/25/2022 Time:  10:25 PM  Group Topic/Focus:  Wrap-Up Group:   The focus of this group is to help patients review their daily goal of treatment and discuss progress on daily workbooks.  Participation Level:  Active  Participation Quality:  Appropriate  Affect:  Appropriate  Cognitive:  Appropriate  Insight: Appropriate  Engagement in Group:  Supportive  Modes of Intervention:  Discussion  Additional Comments:    Baldwin [place] 06/25/2022, 10:25 PM

## 2022-06-25 NOTE — Progress Notes (Addendum)
Patient denied SI/HI/AVH this morning. Patient has been isolated to his room for most of the day. Pt given scheduled medications as prescribed. Q15 min checks verified for safety. Patient verbally contracts for safety. Patient compliant with medications and treatment plan. Pt is safe on the unit.   06/25/22 1000  Psych Admission Type (Psych Patients Only)  Admission Status Voluntary  Psychosocial Assessment  Patient Complaints None  Eye Contact Fair  Facial Expression Animated  Affect Appropriate to circumstance  Speech Logical/coherent  Interaction Assertive  Motor Activity Slow  Appearance/Hygiene Unremarkable  Behavior Characteristics Cooperative;Appropriate to situation  Mood Pleasant  Thought Process  Coherency WDL  Content WDL  Delusions None reported or observed  Perception WDL  Hallucination None reported or observed  Judgment Impaired  Confusion None  Danger to Self  Current suicidal ideation? Denies  Description of Suicide Plan No plan  Agreement Not to Harm Self Yes  Description of Agreement Pt verbally contracts for safety  Danger to Others  Danger to Others None reported or observed

## 2022-06-25 NOTE — Progress Notes (Signed)
[facility] MD Progress Note  06/25/2022 3:28 PM David Wyatt  MRN:  [id number] Subjective:   Principal Problem: MDD (major depressive disorder), recurrent severe, without psychosis Diagnosis: Principal Problem:   MDD (major depressive disorder), recurrent severe, without psychosis  HPI: David Wyatt is a 62 year old African-American male with past psychiatric history of cocaine use disorder, major depressive disorder recurrent severe without psychosis who initially presented to [facility] voluntarily as a walk-in via law enforcement from [facility] for suicidal ideations. Patient was admitted via the [facility] and recommended for inpatient hospitalization; he was transferred to [facility] 06/19/2022.   24 hour chart review: Room precaution for N/V/D discontinued.  Patient is seen attending groups, dining with other patients and coming to the office for assessment. Tolerating breakfast and lunch and p.o. fluids without nausea, vomiting or diarrhea.  Reports sleeping over 9 hours last night.  CBG ranging from 117-197 today.  Diabetic consultant aware.  Compliant with scheduled medications.  As needed medication of trazodone 100 mg p.o. given last night at 2143 for insomnia with effective.  Per chart review, patient had reactive HCV test 06/07/22.   Assessment: Patient is seen and evaluated in the office sitting up in a chair. He presents alert, oriented to person, place, time, and situation.  He appears clean and neat, hygiene commendable.    Reports he has not experience nausea, vomiting, or diarrhea since return from the hospital last night.  He reports that he consumes all his breakfast and lunch and tolerating meals well without vomiting.  Continues on his scheduled psychotropic medication of Cymbalta 60 mg p.o. daily without somatic discomfort.  He continues to ambulate with a rolling walker with safety measures in place to prevent falls.  Today he denies any active SI/HI/AVH;  There is no evidence of delusional or paranoid  thoughts at this time. He contracts for safety on the unit.  He denies any safety concerns at this time.  Will continue treatment plan detailed below, and encouraged patient to increase his p.o. intake and report to nursing staff if nausea vomiting diarrhea reoccurs.  Total Time spent with patient: 30 minutes.  Past Psychiatric History: depression and alcohol use disorder, crack cocaine use disorder   Past Medical History:  Past Medical History:  Diagnosis Date   Angioedema of lips 07/28/2012   left upper (07/29/2012)   Arthritis    hands and back   Chronic back pain    Cocaine abuse    Diabetic neuropathy    High cholesterol    Hypertension    Neuropathy    Type II diabetes mellitus     Past Surgical History:  Procedure Laterality Date   BACK SURGERY     CYSTOSCOPY W/ URETERAL STENT PLACEMENT Right 02/23/2021   Procedure: CYSTOSCOPY WITH RETROGRADE PYELOGRAM/URETERAL STENT PLACEMENT;  Surgeon: Jannifer Hick, MD;  Location: [facility] ORS;  Service: Urology;  Laterality: Right;   HERNIA REPAIR Right 04/01/2012   I & D EXTREMITY Left 12/13/2012   Procedure: IRRIGATION AND DEBRIDEMENT LEFT THUMB;  Surgeon: Tami Ribas, MD;  Location: [facility] OR;  Service: Orthopedics;  Laterality: Left;   INCISION AND DRAINAGE OF WOUND     boil on back/notes 07/14/2008 (07/29/2012)   INGUINAL HERNIA REPAIR  04/01/2012   Procedure: HERNIA REPAIR INGUINAL ADULT;  Surgeon: Currie Paris, MD;  Location: [facility] OR;  Service: General;  Laterality: Right;   INGUINAL HERNIA REPAIR Left 09/06/2016   Procedure: OPEN REPAIR LEFT INGUINAL HERNIA;  Surgeon: Gaynelle Adu, MD;  Location: [facility]  OR;  Service: General;  Laterality: Left;   INSERTION OF MESH Left 09/06/2016   Procedure: INSERTION OF MESH;  Surgeon: Gaynelle Adu, MD;  Location: [facility] OR;  Service: General;  Laterality: Left;   TONSILLECTOMY     Family History:  Family History  Problem Relation Age of Onset   Diabetes Mother    Kidney disease Mother    Hyperlipidemia  Mother    Hyperlipidemia Father    Diabetes Father    Family Psychiatric  History: see H&P Social History:  Social History   Substance and Sexual Activity  Alcohol Use No   Alcohol/week: 0.0 standard drinks of alcohol     Social History   Substance and Sexual Activity  Drug Use Not Currently   Types: "Crack" cocaine    Social History   Socioeconomic History   Marital status: Divorced    Spouse name: Not on file   Number of children: 3   Years of education: 12   Highest education level: Not on file  Occupational History    Comment: disabled  Tobacco Use   Smoking status: Every Day    Packs/day: 0.25    Years: 30.00    Additional pack years: 0.00    Total pack years: 7.50    Types: Cigarettes   Smokeless tobacco: Former    Quit date: 08/15/2015   Tobacco comments:    04/29/16 2  cigs daily, 10/27/17 sometimes < .25 PPD  Vaping Use   Vaping Use: Never used  Substance and Sexual Activity   Alcohol use: No    Alcohol/week: 0.0 standard drinks of alcohol   Drug use: Not Currently    Types: "Crack" cocaine   Sexual activity: Not on file  Other Topics Concern   Not on file  Social History Narrative   04/29/17   Lives in shelter   Caffeine- a lot of  tea, coffee   Social Determinants of Health   Financial Resource Strain: Not on file  Food Insecurity: Food Insecurity Present (06/20/2022)   Hunger Vital Sign    Worried About Running Out of Food in the Last Year: Sometimes true    Ran Out of Food in the Last Year: Sometimes true  Transportation Needs: Unmet Transportation Needs (06/20/2022)   PRAPARE - Administrator, Civil Service (Medical): Yes    Lack of Transportation (Non-Medical): Patient declined  Physical Activity: Not on file  Stress: Not on file  Social Connections: Not on file   Additional Social History:    Sleep: Good  Appetite:  Good  Current Medications: Current Facility-Administered Medications  Medication Dose Route Frequency  Provider Last Rate Last Admin   acetaminophen (TYLENOL) tablet 650 mg  650 mg Oral Q6H PRN Lenard Lance, FNP   650 mg at 06/23/22 0813   alum & mag hydroxide-simeth (MAALOX/MYLANTA) 200-200-20 MG/5ML suspension 30 mL  30 mL Oral Q4H PRN Lenard Lance, FNP       amLODipine (NORVASC) tablet 10 mg  10 mg Oral Daily Lenard Lance, FNP   10 mg at 06/25/22 0811   atorvastatin (LIPITOR) tablet 40 mg  40 mg Oral Daily Lenard Lance, FNP   40 mg at 06/25/22 0811   diphenhydrAMINE (BENADRYL) capsule 50 mg  50 mg Oral TID PRN Lenard Lance, FNP       Or   diphenhydrAMINE (BENADRYL) injection 50 mg  50 mg Intramuscular TID PRN Lenard Lance, FNP  DULoxetine (CYMBALTA) DR capsule 60 mg  60 mg Oral Daily Lenard Lance, FNP   60 mg at 06/25/22 0811   famotidine (PEPCID) tablet 10 mg  10 mg Oral BID Lenard Lance, FNP   10 mg at 06/25/22 1610   gabapentin (NEURONTIN) capsule 600 mg  600 mg Oral TID Lenard Lance, FNP   600 mg at 06/25/22 1205   glipiZIDE (GLUCOTROL XL) 24 hr tablet 10 mg  10 mg Oral Q breakfast Lenard Lance, FNP   10 mg at 06/25/22 0810   glucose chewable tablet 4 g  1 tablet Oral Once [place], Ene A, NP       haloperidol (HALDOL) tablet 5 mg  5 mg Oral TID PRN Lenard Lance, FNP       Or   haloperidol lactate (HALDOL) injection 5 mg  5 mg Intramuscular TID PRN Lenard Lance, FNP       insulin aspart (novoLOG) injection 0-9 Units  0-9 Units Subcutaneous TID WC Lenard Lance, FNP   2 Units at 06/25/22 1206   insulin glargine-yfgn (SEMGLEE) injection 30 Units  30 Units Subcutaneous QHS Lenard Lance, FNP   30 Units at 06/24/22 2144   loperamide (IMODIUM) capsule 4 mg  4 mg Oral PRN Rex Kras, MD   4 mg at 06/23/22 0813   LORazepam (ATIVAN) tablet 2 mg  2 mg Oral TID PRN Lenard Lance, FNP       Or   LORazepam (ATIVAN) injection 2 mg  2 mg Intramuscular TID PRN Lenard Lance, FNP       magnesium hydroxide (MILK OF MAGNESIA) suspension 30 mL  30 mL Oral Daily PRN Lenard Lance, FNP        metFORMIN (GLUCOPHAGE) tablet 1,000 mg  1,000 mg Oral BID WC Lenard Lance, FNP   1,000 mg at 06/25/22 0810   ondansetron (ZOFRAN-ODT) disintegrating tablet 4 mg  4 mg Oral Q6H PRN Charlynne Pander, MD       tamsulosin [facility]) capsule 0.4 mg  0.4 mg Oral Daily Lenard Lance, FNP   0.4 mg at 06/25/22 0810   traZODone (DESYREL) tablet 100 mg  100 mg Oral QHS PRN Lenard Lance, FNP   100 mg at 06/24/22 2143    Lab Results:  Results for orders placed or performed during the hospital encounter of 06/19/22 (from the past 48 hour(s))  TSH     Status: Abnormal   Collection Time: 06/23/22  5:25 PM  Result Value Ref Range   TSH 0.321 (L) 0.350 - 4.500 uIU/mL    Comment: Performed by a 3rd Generation assay with a functional sensitivity of <=0.01 uIU/mL. Performed at [facility], [address] [place], [place] [address]   Hemoglobin A1c     Status: Abnormal   Collection Time: 06/23/22  5:25 PM  Result Value Ref Range   Hgb A1c MFr Bld 11.4 (H) 4.8 - 5.6 %    Comment: (NOTE) Pre diabetes:          5.7%-6.4%  Diabetes:              >6.4%  Glycemic control for   <7.0% adults with diabetes    Mean Plasma Glucose 280.48 mg/dL    Comment: Performed at [facility], [address], [place], [place] [address]  CBC with Differential     Status: Abnormal   Collection Time: 06/23/22  5:25 PM  Result Value Ref Range   WBC 13.0 (H) 4.0 - 10.5 K/uL   RBC 4.30 4.22 - 5.81 MIL/uL   Hemoglobin 12.7 (L) 13.0 - 17.0 g/dL   HCT 16.1 09.6 - 04.5 %   MCV 95.3 80.0 - 100.0 fL   MCH 29.5 26.0 - 34.0 pg   MCHC 31.0 30.0 - 36.0 g/dL   RDW 40.9 81.1 - 91.4 %   Platelets 475 (H) 150 - 400 K/uL   nRBC 0.0 0.0 - 0.2 %   Neutrophils Relative % 70 %   Neutro Abs 9.1 (H) 1.7 - 7.7 K/uL   Lymphocytes Relative 17 %   Lymphs Abs 2.2 0.7 - 4.0 K/uL   Monocytes Relative 8 %   Monocytes Absolute 1.1 (H) 0.1 - 1.0 K/uL   Eosinophils Relative 4 %   Eosinophils Absolute 0.5 0.0 - 0.5  K/uL   Basophils Relative 0 %   Basophils Absolute 0.0 0.0 - 0.1 K/uL   Immature Granulocytes 1 %   Abs Immature Granulocytes 0.06 0.00 - 0.07 K/uL    Comment: Performed at [facility], [address], [place], [place] [address]  Comprehensive metabolic panel     Status: Abnormal   Collection Time: 06/23/22  5:25 PM  Result Value Ref Range   Sodium 136 135 - 145 mmol/L   Potassium 4.3 3.5 - 5.1 mmol/L   Chloride 105 98 - 111 mmol/L   CO2 21 (L) 22 - 32 mmol/L   Glucose, Bld 109 (H) 70 - 99 mg/dL    Comment: Glucose reference range applies only to samples taken after fasting for at least 8 hours.   BUN 25 (H) 8 - 23 mg/dL   Creatinine, Ser 6.21 0.61 - 1.24 mg/dL   Calcium 8.6 (L) 8.9 - 10.3 mg/dL   Total Protein 7.6 6.5 - 8.1 g/dL   Albumin 3.2 (L) 3.5 - 5.0 g/dL   AST 17 15 - 41 U/L   ALT 16 0 - 44 U/L   Alkaline Phosphatase 79 38 - 126 U/L   Total Bilirubin 1.0 0.3 - 1.2 mg/dL   GFR, Estimated >30 >86 mL/min    Comment: (NOTE) Calculated using the CKD-EPI Creatinine Equation (2021)    Anion gap 10 5 - 15    Comment: Performed at [facility], [address], [place], [place] [address]  Glucose, capillary     Status: None   Collection Time: 06/23/22  5:28 PM  Result Value Ref Range   Glucose-Capillary 94 70 - 99 mg/dL    Comment: Glucose reference range applies only to samples taken after fasting for at least 8 hours.  C Difficile Quick Screen w PCR reflex     Status: None   Collection Time: 06/23/22  7:15 PM   Specimen: STOOL  Result Value Ref Range   C Diff antigen NEGATIVE NEGATIVE   C Diff toxin NEGATIVE NEGATIVE   C Diff interpretation No C. difficile detected.     Comment: Performed at [facility], [address], [place], [place] [address]  Glucose, capillary     Status: None   Collection Time: 06/23/22 10:10 PM  Result Value Ref Range   Glucose-Capillary 80 70 - 99 mg/dL    Comment: Glucose reference  range applies only to samples taken after fasting for at least 8 hours.   Comment 1 Repeat Test   Glucose, capillary     Status: None   Collection Time: 06/24/22 12:25  AM  Result Value Ref Range   Glucose-Capillary 76 70 - 99 mg/dL    Comment: Glucose reference range applies only to samples taken after fasting for at least 8 hours.   Comment 1 Notify RN    Comment 2 Document in Chart   Glucose, capillary     Status: Abnormal   Collection Time: 06/24/22  5:05 AM  Result Value Ref Range   Glucose-Capillary 59 (L) 70 - 99 mg/dL    Comment: Glucose reference range applies only to samples taken after fasting for at least 8 hours.  Glucose, capillary     Status: Abnormal   Collection Time: 06/24/22  5:53 AM  Result Value Ref Range   Glucose-Capillary 106 (H) 70 - 99 mg/dL    Comment: Glucose reference range applies only to samples taken after fasting for at least 8 hours.   Comment 1 Notify RN    Comment 2 Document in Chart   Glucose, capillary     Status: Abnormal   Collection Time: 06/24/22  8:14 AM  Result Value Ref Range   Glucose-Capillary 170 (H) 70 - 99 mg/dL    Comment: Glucose reference range applies only to samples taken after fasting for at least 8 hours.  Glucose, capillary     Status: Abnormal   Collection Time: 06/24/22 11:41 AM  Result Value Ref Range   Glucose-Capillary 177 (H) 70 - 99 mg/dL    Comment: Glucose reference range applies only to samples taken after fasting for at least 8 hours.  Glucose, capillary     Status: Abnormal   Collection Time: 06/24/22 11:53 AM  Result Value Ref Range   Glucose-Capillary 156 (H) 70 - 99 mg/dL    Comment: Glucose reference range applies only to samples taken after fasting for at least 8 hours.  Glucose, capillary     Status: Abnormal   Collection Time: 06/24/22  5:26 PM  Result Value Ref Range   Glucose-Capillary 152 (H) 70 - 99 mg/dL    Comment: Glucose reference range applies only to samples taken after fasting for at least 8  hours.  Glucose, capillary     Status: Abnormal   Collection Time: 06/24/22  7:53 PM  Result Value Ref Range   Glucose-Capillary 155 (H) 70 - 99 mg/dL    Comment: Glucose reference range applies only to samples taken after fasting for at least 8 hours.  Glucose, capillary     Status: Abnormal   Collection Time: 06/25/22  5:40 AM  Result Value Ref Range   Glucose-Capillary 117 (H) 70 - 99 mg/dL    Comment: Glucose reference range applies only to samples taken after fasting for at least 8 hours.  Glucose, capillary     Status: Abnormal   Collection Time: 06/25/22 12:03 PM  Result Value Ref Range   Glucose-Capillary 197 (H) 70 - 99 mg/dL    Comment: Glucose reference range applies only to samples taken after fasting for at least 8 hours.   Comment 1 Notify RN     Blood Alcohol level:  Lab Results  Component Value Date   ETH <10 06/19/2022   ETH <10 06/17/2022    Metabolic Disorder Labs: Lab Results  Component Value Date   HGBA1C 11.4 (H) 06/23/2022   MPG 280.48 06/23/2022   MPG 251.78 01/21/2022   Lab Results  Component Value Date   PROLACTIN 6.9 06/19/2022   Lab Results  Component Value Date   CHOL 213 (H) 05/23/2021   TRIG 115  05/23/2021   HDL 71 05/23/2021   CHOLHDL 3.2 11/03/2019   VLDL 32 (H) 05/03/2015   LDLCALC 122 (H) 05/23/2021   LDLCALC 99 11/03/2019    Physical Findings: AIMS:  , ,  ,  ,    CIWA:    COWS:     Musculoskeletal: Strength & Muscle Tone: within normal limits Gait & Station: normal Patient leans: N/A  Psychiatric Specialty Exam:  Presentation  General Appearance:  Appropriate for Environment; Fairly Groomed; Casual  Eye Contact: Fair  Speech: Clear and Coherent  Speech Volume: Normal  Handedness: Right  Mood and Affect  Mood: Euthymic  Affect: Congruent  Thought Process  Thought Processes: Linear  Descriptions of Associations:Intact  Orientation:Full (Time, Place and Person)  Thought Content:WDL;  Logical  History of Schizophrenia/Schizoaffective disorder:No  Duration of Psychotic Symptoms:No data recorded Hallucinations:Hallucinations: None  Ideas of Reference:None  Suicidal Thoughts:Suicidal Thoughts: No SI Active Intent and/or Plan: -- (n/a) SI Passive Intent and/or Plan: -- (n/a)  Homicidal Thoughts:Homicidal Thoughts: No  Sensorium  Memory: Immediate Fair; Recent Fair  Judgment: Fair  Insight: Fair  Executive Functions  Concentration: Good  Attention Span: Good  Recall: Fair  Fund of Knowledge: Fair  Language: Good  Psychomotor Activity  Psychomotor Activity: Psychomotor Activity: Normal  Assets  Assets: Communication Skills; Desire for Improvement; Physical Health; Resilience  Sleep  Sleep: Sleep: Good Number of Hours of Sleep: 9  Physical Exam: Physical Exam Vitals and nursing note reviewed.  Constitutional:      Appearance: He is normal weight.  HENT:     Head: Normocephalic.     Nose: Nose normal.     Mouth/Throat:     Mouth: Mucous membranes are moist.     Pharynx: Oropharynx is clear.  Eyes:     Conjunctiva/sclera: Conjunctivae normal.     Pupils: Pupils are equal, round, and reactive to light.  Cardiovascular:     Rate and Rhythm: Normal rate.     Pulses: Normal pulses.     Comments: Blood pressure 146/76, pulse 114.  Nursing staff to recheck vital signs. Pulmonary:     Effort: Pulmonary effort is normal.  Genitourinary:    Comments: deferred Musculoskeletal:        General: Normal range of motion.     Cervical back: Normal range of motion.  Skin:    General: Skin is warm.  Neurological:     General: No focal deficit present.     Mental Status: He is alert and oriented to person, place, and time.  Psychiatric:        Attention and Perception: He does not perceive auditory or visual hallucinations.        Mood and Affect: Mood and affect normal.        Speech: Speech normal.        Behavior: Behavior is  withdrawn. Behavior is cooperative.        Thought Content: Thought content normal. Thought content is not paranoid or delusional. Thought content does not include homicidal or suicidal ideation. Thought content does not include homicidal or suicidal plan.    Review of Systems  Constitutional: Negative.   HENT: Negative.    Respiratory: Negative.    Cardiovascular: Negative.   Musculoskeletal: Negative.   Skin: Negative.   Neurological: Negative.   Endo/Heme/Allergies: Negative.   Psychiatric/Behavioral:  Positive for substance abuse. The patient is nervous/anxious and has insomnia.    Blood pressure 116/77, pulse 84, temperature 98.2 F (36.8 C), temperature source Oral,  resp. rate 15, height 5\' 8"  (1.727 m), weight 75 kg, SpO2 100 %. Body mass index is 25.14 kg/m.  Treatment Plan Summary: Daily contact with patient to assess and evaluate symptoms and progress in treatment, Medication management, and Plan   PLAN:  Safety and Monitoring:             --  Voluntary admission to inpatient psychiatric unit for safety, stabilization and treatment             -- Daily contact with patient to assess and evaluate symptoms and progress in treatment             -- Patient's case to be discussed in multi-disciplinary team meeting             -- Observation Level : q15 minute checks             -- Vital signs:  q12 hours             -- Precautions: suicide, elopement, and assault   2. Psychiatric Diagnoses and Treatment:   Medications:  Continue:       -- Cymbalta DR 60 mg PO daily  --  The risks/benefits/side-effects/alternatives to this medication were discussed in detail with the patient and time was given for questions. The patient consents to medication trial.  -- FDA started home medications that included duloxetine 60 mg a day and gabapentin 603 times a day.             -- Metabolic profile and EKG monitoring obtained while on an atypical antipsychotic (BMI: Lipid Panel: HbgA1c: QTc:)  as indicated             -- Encouraged patient to participate in unit milieu and in scheduled group therapies              -- Short Term Goals: Ability to identify changes in lifestyle to reduce recurrence of condition will improve, Ability to verbalize feelings will improve, Ability to disclose and discuss suicidal ideas, Ability to demonstrate self-control will improve, Ability to identify and develop effective coping behaviors will improve, and Ability to maintain clinical measurements within normal limits will improve             -- Long Term Goals: Improvement in symptoms so as ready for discharge                3. Medical Issues Being Addressed:              Tobacco Use Disorder             -- Nicotine patch 21mg /24 hours  ordered             -- Smoking cessation encouraged  -- Amlodipine 10 mg PO daily HTN  -- Atorvastatin 40 mg PO daily  -- Famotidine 10 mg PO BID  -- Gabapentin 600 mg TID   -- Glipizide 10 mg daily with  breakfast  -- Insulin Aspart 0-9 units SQ TID  with meals  -- Insulin Glargine-yfgn SQ 30 units  daily HS  -- Metformin 1000 mg BID with meals  -- Tamsulosin 0.4 mg PO daily    4. Discharge Planning:              -- Social work and case management to assist with discharge planning and identification of hospital follow-up needs prior to discharge             -- Estimated LOS: 5-7 days             --  Discharge Concerns: Need to establish a safety plan; Medication compliance and effectiveness             -- Discharge Goals: Return home with outpatient referrals for mental health follow-up including medication management/psychotherapy    I certify that inpatient services furnished can reasonably be expected to improve the patient's condition.    Cecilie Lowers, FNP 06/25/2022, 3:28 PMPatient ID: David Wyatt, male   DOB: [birth year], 62 y.o.   MRN: [id number] Patient ID: David Wyatt, male   DOB: 02/06/1961, 62 y.o.   MRN: [id number] Patient ID: David Wyatt, male    DOB: 17-Sep-1960, 62 y.o.   MRN: [id number]

## 2022-06-25 NOTE — Group Note (Signed)
Recreation Therapy Group Note   Group Topic:Animal Assisted Therapy   Group Date: 06/25/2022 Start Time: 0950 End Time: 1030 Facilitators: Monea Pesantez-McCall, LRT,CTRS Location: [address] Dayroom   Animal-Assisted Activity (AAA) Program Checklist/Progress Notes Patient Eligibility Criteria Checklist & Daily Group note for Rec Tx Intervention  AAA/T Program Assumption of Risk Form signed by Patient/ or Parent Legal Guardian Yes  Patient understands his/her participation is voluntary Yes   Affect/Mood: N/A   Participation Level: Did not attend    Clinical Observations/Individualized Feedback:     Plan: Continue to engage patient in RT group sessions 2-3x/week.   Ozelle Brubacher-McCall, LRT,CTRS  06/25/2022 12:28 PM

## 2022-06-26 ENCOUNTER — Other Ambulatory Visit: Payer: Self-pay

## 2022-06-26 DIAGNOSIS — F332 Major depressive disorder, recurrent severe without psychotic features: Secondary | ICD-10-CM | POA: Diagnosis not present

## 2022-06-26 LAB — GLUCOSE, CAPILLARY
Glucose-Capillary: 80 mg/dL (ref 70–99)
Glucose-Capillary: 189 mg/dL — ABNORMAL HIGH (ref 70–99)

## 2022-06-26 MED ORDER — METFORMIN HCL 1000 MG PO TABS
1000.0000 mg | ORAL_TABLET | Freq: Two times a day (BID) | ORAL | 0 refills | Status: DC
  Filled 2022-06-26: qty 60, 30d supply, fill #0

## 2022-06-26 MED ORDER — GLIPIZIDE ER 10 MG PO TB24
10.0000 mg | ORAL_TABLET | Freq: Every day | ORAL | 0 refills | Status: DC
  Filled 2022-06-26: qty 30, 30d supply, fill #0

## 2022-06-26 MED ORDER — DULOXETINE HCL 60 MG PO CPEP
60.0000 mg | ORAL_CAPSULE | Freq: Every day | ORAL | 3 refills | Status: DC
  Filled 2022-06-26: qty 30, 30d supply, fill #0

## 2022-06-26 MED ORDER — INSULIN GLARGINE-YFGN 100 UNIT/ML ~~LOC~~ SOLN
30.0000 [IU] | Freq: Every day | SUBCUTANEOUS | 11 refills | Status: DC
  Filled 2022-06-26: qty 10, 28d supply, fill #0

## 2022-06-26 MED ORDER — INSULIN ASPART 100 UNIT/ML IJ SOLN
0.0000 [IU] | Freq: Three times a day (TID) | INTRAMUSCULAR | 11 refills | Status: DC
  Filled 2022-06-26: qty 10, 28d supply, fill #0

## 2022-06-26 NOTE — [facility] Suicide Risk Assessment (Signed)
[facility] INPATIENT:  Family/Significant Other Suicide Prevention Education  Suicide Prevention Education:  Patient Refusal for Family/Significant Other Suicide Prevention Education: The patient David Wyatt has refused to provide written consent for family/significant other to be provided Family/Significant Other Suicide Prevention Education during admission and/or prior to discharge.  Physician notified.  Corky Crafts 06/26/2022, 9:22 AM

## 2022-06-26 NOTE — Progress Notes (Signed)
Discharge note: RN met with pt and reviewed pt's discharge instructions. Pt verbalized understanding of discharge instructions and pt did not have any questions. RN reviewed and provided pt with a copy of Suicide Safety Plan, SRA, AVS and Transition Record. RN returned pt's belongings to pt. Pt denied SI/HI/AVH and voiced no concerns. Pt was appreciative of the care pt received at [facility]. Patient discharged to the lobby without incident. 

## 2022-06-26 NOTE — Group Note (Signed)
Date:  06/26/2022 Time:  9:30 AM  Group Topic/Focus:  Goals Group:   The focus of this group is to help patients establish daily goals to achieve during treatment and discuss how the patient can incorporate goal setting into their daily lives to aide in recovery. Orientation:   The focus of this group is to educate the patient on the purpose and policies of crisis stabilization and provide a format to answer questions about their admission.  The group details unit policies and expectations of patients while admitted.    Participation Level:  Did Not Attend  Deforest [facility] 06/26/2022, 9:30 AM

## 2022-06-26 NOTE — Plan of Care (Signed)
  Problem: Education: Goal: Emotional status will improve Outcome: Progressing Goal: Mental status will improve Outcome: Progressing Goal: Verbalization of understanding the information provided will improve Outcome: Progressing   Problem: Activity: Goal: Interest or engagement in activities will improve Outcome: Progressing Goal: Sleeping patterns will improve Outcome: Progressing   Problem: Coping: Goal: Ability to verbalize frustrations and anger appropriately will improve Outcome: Progressing   Problem: Safety: Goal: Periods of time without injury will increase Outcome: Progressing   Problem: Education: Goal: Knowledge of the prescribed therapeutic regimen will improve Outcome: Progressing   Problem: Activity: Goal: Imbalance in normal sleep/wake cycle will improve Outcome: Progressing   Problem: Coping: Goal: Coping ability will improve Outcome: Progressing Goal: Will verbalize feelings Outcome: Progressing   Problem: Safety: Goal: Ability to disclose and discuss suicidal ideas will improve Outcome: Progressing   Problem: Self-Concept: Goal: Will verbalize positive feelings about self Outcome: Progressing

## 2022-06-26 NOTE — Progress Notes (Signed)
   06/25/22 2033  Psych Admission Type (Psych Patients Only)  Admission Status Voluntary  Psychosocial Assessment  Patient Complaints None  Eye Contact Fair  Facial Expression Animated  Affect Appropriate to circumstance  Speech Logical/coherent  Interaction Assertive  Motor Activity Slow  Appearance/Hygiene Unremarkable  Behavior Characteristics Cooperative;Appropriate to situation  Mood Pleasant  Thought Process  Coherency WDL  Content WDL  Delusions None reported or observed  Perception WDL  Hallucination None reported or observed  Judgment Impaired  Confusion None  Danger to Self  Current suicidal ideation? Denies (Denies)  Description of Suicide Plan No plan  Agreement Not to Harm Self Yes  Description of Agreement Verbally contracts for safety  Danger to Others  Danger to Others None reported or observed
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
## 2022-06-26 NOTE — Progress Notes (Signed)
  [facility] Adult Case Management Discharge Plan :  Will you be returning to the same living situation after discharge:  Yes,  Patient to return to [facility] for further case management. At discharge, do you have transportation home?: Yes,  CSW to provide patient with cab voucher to [facility] Do you have the ability to pay for your medications: Yes,  [facility]   Release of information consent forms completed and in the chart;  Patient's signature needed at discharge.  Patient to Follow up at:  Follow-up Information     [facility]. Go to.   Specialty: Behavioral Health Why: For fastest service, please go to this provider for an in-person assessment, to obtain therapy and medication management services on Monday through Friday, arrive by 7:20 am. Assessments are made on a first come, first served basis each day. Contact information: [address] [place] [address] [phone]                Next level of care provider has access to [facility] Link:yes  Safety Planning and Suicide Prevention discussed: Yes,  SPE completed with patient by nursing staff, declined consent for CSW to reach family/friend.     Has patient been referred to the Quitline?: Patient refused referral Tobacco Use: High Risk (06/23/2022)   Patient History    Smoking Tobacco Use: Every Day    Smokeless Tobacco Use: Former    Passive Exposure: Not on file   Patient has been referred for addiction treatment: Pt. refused referral Social History   Substance and Sexual Activity  Drug Use Not Currently   Types: "Crack" cocaine   Social History   Substance and Sexual Activity  Alcohol Use No   Alcohol/week: 0.0 standard drinks of alcohol    Corky Crafts, LCSWA 06/26/2022, 9:20 AM

## 2022-06-26 NOTE — Group Note (Signed)
Recreation Therapy Group Note   Group Topic:Health and Wellness  Group Date: 06/26/2022 Start Time: 0935 End Time: 1009 Facilitators: Trevone Prestwood-McCall, LRT,CTRS Location: [address] Day[address]   Goal Area(s) Addresses:   Patient will verbalize benefit of exercise during group session. Patient will verbalize an exercise that can be completed in their hospital room during admission. Patient will identify an exercise that can be completed post d/c.   Group Description:  Exercise.  LRT and patients took turns leading the group in various exercises.  Patients were to complete the exercises based on their physical abilities.  Patients could take breaks as needed and get water if needed.   Affect/Mood: N/A   Participation Level: Did not attend    Clinical Observations/Individualized Feedback:     Plan: Continue to engage patient in RT group sessions 2-3x/week.   Tyarra Nolton-McCall, LRT,CTRS 06/26/2022 11:59 AM

## 2022-06-28 LAB — T4: T4, Total: 6.1 ug/dL (ref 4.5–12.0)

## 2022-06-28 LAB — T3, FREE: T3, Free: 2.1 pg/mL (ref 2.0–4.4)

## 2022-06-30 ENCOUNTER — Ambulatory Visit (HOSPITAL_COMMUNITY)
Admission: EM | Admit: 2022-06-30 | Discharge: 2022-06-30 | Disposition: A | Discharge status: Home / Self Care | Payer: Medicaid Other | Attending: Behavioral Health | Admitting: Behavioral Health

## 2022-06-30 DIAGNOSIS — Z5900 Homelessness unspecified: Secondary | ICD-10-CM | POA: Insufficient documentation
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
## 2022-06-30 NOTE — Discharge Instructions (Addendum)
Discharge recommendations:   Medications: Patient is to take medications as prescribed. The patient or patient's guardian is to contact a medical professional and/or outpatient provider to address any new side effects that develop. The patient or the patient's guardian should update outpatient providers of any new medications and/or medication changes.   Outpatient Follow up: Please review list of outpatient resources for psychiatry and counseling. Please follow up with your primary care provider for all medical related needs.   Therapy: We recommend that patient participate in individual therapy to address mental health concerns.  Atypical antipsychotics: If you are prescribed an atypical antipsychotic, it is recommended that your height, weight, BMI, blood pressure, fasting lipid panel, and fasting blood sugar be monitored by your outpatient providers.  Safety:   The following safety precautions should be taken:   No sharp objects. This includes scissors, razors, scrapers, and putty knives.   Chemicals should be removed and locked up.   Medications should be removed and locked up.   Weapons should be removed and locked up. This includes firearms, knives and instruments that can be used to cause injury.   The patient should abstain from use of illicit substances/drugs and abuse of any medications.  If symptoms worsen or do not continue to improve or if the patient becomes actively suicidal or homicidal then it is recommended that the patient return to the closest hospital emergency department, the [facility], or call 911 for further evaluation and treatment. National Suicide Prevention Lifeline 1-800-SUICIDE or 570-681-7278.  About 988 988 offers 24/7 access to trained crisis counselors who can help people experiencing mental health-related distress. People can call or text 988 or chat 988lifeline.org for themselves or if they are worried about a loved one  who may need crisis support.   Please contact one of the following facilities to start medication management and therapy services:   [facility] at [facility] [address] ([address] [place])  [place], [place]  [address] Phone: [phone]  [facility]  [address] [place], [place] [address] Phone: [phone]  [facility]  [address]  [place], [place] [address]  [facility]  [address]  [place], [place] [address] Phone: [phone]

## 2022-07-02 ENCOUNTER — Encounter (HOSPITAL_COMMUNITY): Payer: Self-pay

## 2022-07-02 ENCOUNTER — Emergency Department (HOSPITAL_COMMUNITY)
Admission: EM | Admit: 2022-07-02 | Discharge: 2022-07-02 | Disposition: A | Discharge status: Home / Self Care | Payer: Medicaid Other | Attending: Emergency Medicine | Admitting: Emergency Medicine

## 2022-07-02 ENCOUNTER — Other Ambulatory Visit: Payer: Self-pay

## 2022-07-02 DIAGNOSIS — R739 Hyperglycemia, unspecified: Secondary | ICD-10-CM | POA: Insufficient documentation

## 2022-07-02 LAB — CBC WITH DIFFERENTIAL/PLATELET
Abs Immature Granulocytes: 0.06 10*3/uL (ref 0.00–0.07)
Basophils Absolute: 0.1 10*3/uL (ref 0.0–0.1)
Basophils Relative: 1 %
Eosinophils Absolute: 0.3 10*3/uL (ref 0.0–0.5)
Eosinophils Relative: 3 %
HCT: 36.4 % — ABNORMAL LOW (ref 39.0–52.0)
Hemoglobin: 11.5 g/dL — ABNORMAL LOW (ref 13.0–17.0)
Immature Granulocytes: 1 %
Lymphocytes Relative: 21 %
Lymphs Abs: 1.6 10*3/uL (ref 0.7–4.0)
MCH: 29.9 pg (ref 26.0–34.0)
MCHC: 31.6 g/dL (ref 30.0–36.0)
MCV: 94.8 fL (ref 80.0–100.0)
Monocytes Absolute: 0.5 10*3/uL (ref 0.1–1.0)
Monocytes Relative: 6 %
Neutro Abs: 5 10*3/uL (ref 1.7–7.7)
Neutrophils Relative %: 68 %
Platelets: 517 10*3/uL — ABNORMAL HIGH (ref 150–400)
RBC: 3.84 MIL/uL — ABNORMAL LOW (ref 4.22–5.81)
RDW: 13.2 % (ref 11.5–15.5)
WBC: 7.5 10*3/uL (ref 4.0–10.5)
nRBC: 0 % (ref 0.0–0.2)

## 2022-07-02 LAB — BASIC METABOLIC PANEL
Anion gap: 8 (ref 5–15)
BUN: 16 mg/dL (ref 8–23)
CO2: 24 mmol/L (ref 22–32)
Calcium: 8.7 mg/dL — ABNORMAL LOW (ref 8.9–10.3)
Chloride: 105 mmol/L (ref 98–111)
Creatinine, Ser: 1.07 mg/dL (ref 0.61–1.24)
GFR, Estimated: >60 mL/min (ref >60)
Glucose, Bld: 522 mg/dL (ref 70–99)
Potassium: 4.4 mmol/L (ref 3.5–5.1)
Sodium: 137 mmol/L (ref 135–145)

## 2022-07-02 LAB — RAPID URINE DRUG SCREEN, HOSP PERFORMED
Amphetamines: NOT DETECTED
Barbiturates: NOT DETECTED
Benzodiazepines: NOT DETECTED
Cocaine: POSITIVE — AB
Opiates: NOT DETECTED
Tetrahydrocannabinol: NOT DETECTED

## 2022-07-02 LAB — CBG MONITORING, ED
Glucose-Capillary: 454 mg/dL — ABNORMAL HIGH (ref 70–99)
Glucose-Capillary: 370 mg/dL — ABNORMAL HIGH (ref 70–99)

## 2022-07-02 MED ORDER — SODIUM CHLORIDE 0.9 % IV BOLUS
1000.0000 mL | Freq: Once | INTRAVENOUS | Status: AC
  Administered 2022-07-02: 1000 mL via INTRAVENOUS

## 2022-07-02 MED ORDER — INSULIN ASPART 100 UNIT/ML IJ SOLN
8.0000 [IU] | Freq: Once | INTRAMUSCULAR | Status: AC
  Administered 2022-07-02: 8 [IU] via SUBCUTANEOUS
  Filled 2022-07-02: qty 0.08

## 2022-07-02 NOTE — ED Provider Notes (Signed)
[place] EMERGENCY DEPARTMENT AT [facility] Provider Note   CSN: [id number] Arrival date & time: 07/02/22  1015     History  Chief Complaint  Patient presents with   Hyperglycemia    David Wyatt is a 62 y.o. male.  62 year old male with prior medical history as detailed below presents for evaluation.  Patient reports that he has been at the [facility] for the last several days.  Patient reports that he has not been taking his insulin because "he thinks that someone will steal it".  He denies symptoms.  Apparently, staff at the [facility] offered to check his glucose today.  It was elevated.  He decided to come to the ED for evaluation and treatment.  The history is provided by the patient and medical records.       Home Medications Prior to Admission medications   Medication Sig Start Date End Date Taking? Authorizing Provider  amLODipine (NORVASC) 10 MG tablet Take 1 tablet (10 mg total) by mouth daily. Patient not taking: Reported on 06/20/2022 04/10/22   Hoy Register, MD  atorvastatin (LIPITOR) 40 MG tablet Take 1 tablet (40 mg total) by mouth daily. Patient not taking: Reported on 06/20/2022 04/10/22   Hoy Register, MD  DULoxetine (CYMBALTA) 60 MG capsule Take 1 capsule (60 mg total) by mouth daily. 06/27/22   Ntuen, Jesusita Oka, FNP  famotidine (PEPCID) 10 MG tablet Take 1 tablet (10 mg total) by mouth 2 (two) times daily. Patient not taking: Reported on 06/20/2022 04/10/22   Hoy Register, MD  gabapentin (NEURONTIN) 300 MG capsule Take 2 capsules (600 mg total) by mouth 3 (three) times daily. Patient not taking: Reported on 06/20/2022 04/10/22   Hoy Register, MD  glipiZIDE (GLUCOTROL XL) 10 MG 24 hr tablet Take 1 tablet (10 mg total) by mouth daily with breakfast. 06/27/22   Ntuen, Jesusita Oka, FNP  insulin aspart (NOVOLOG) 100 UNIT/ML injection Inject 0-9 Units into the skin 3 (three) times daily with meals. 06/26/22   Ntuen, Jesusita Oka, FNP  insulin glargine-yfgn (SEMGLEE) 100  UNIT/ML injection Inject 0.3 mLs (30 Units total) into the skin at bedtime. 06/26/22   Ntuen, Jesusita Oka, FNP  metFORMIN (GLUCOPHAGE) 1000 MG tablet Take 1 tablet (1,000 mg total) by mouth 2 (two) times daily with a meal. 06/26/22   Ntuen, Jesusita Oka, FNP  tamsulosin (FLOMAX) 0.4 MG CAPS capsule Take 1 capsule (0.4 mg total) by mouth daily. Patient not taking: Reported on 06/20/2022 04/10/22   Hoy Register, MD  traZODone (DESYREL) 100 MG tablet Take 1 tablet (100 mg total) by mouth at bedtime as needed for sleep. Patient not taking: Reported on 06/20/2022 04/10/22   Hoy Register, MD  loratadine (CLARITIN) 10 MG tablet Take 1 tablet (10 mg total) by mouth daily. Patient not taking: No sig reported 06/03/17 07/20/20  Loren Racer, MD      Allergies    Lisinopril    Review of Systems   Review of Systems  All other systems reviewed and are negative.   Physical Exam Updated Vital Signs BP (!) 143/87 (BP Location: Left Arm)   Pulse 68   Temp 98.2 F (36.8 C) (Oral)   Resp 20   Ht  (1.727 m) Comment: Simultaneous filing. User may not have seen previous data.  Wt 74.8 kg Comment: Simultaneous filing. User may not have seen previous data.  SpO2 94%   BMI 25.09 kg/m  Physical Exam Vitals and nursing note reviewed.  Constitutional:  General: He is not in acute distress.    Appearance: Normal appearance. He is well-developed.  HENT:     Head: Normocephalic and atraumatic.  Eyes:     Conjunctiva/sclera: Conjunctivae normal.     Pupils: Pupils are equal, round, and reactive to light.  Cardiovascular:     Rate and Rhythm: Normal rate and regular rhythm.     Heart sounds: Normal heart sounds.  Pulmonary:     Effort: Pulmonary effort is normal. No respiratory distress.     Breath sounds: Normal breath sounds.  Abdominal:     General: There is no distension.     Palpations: Abdomen is soft.     Tenderness: There is no abdominal tenderness.  Musculoskeletal:        General: No  deformity. Normal range of motion.     Cervical back: Normal range of motion and neck supple.  Skin:    General: Skin is warm and dry.  Neurological:     General: No focal deficit present.     Mental Status: He is alert and oriented to person, place, and time.     ED Results / Procedures / Treatments   Labs (all labs ordered are listed, but only abnormal results are displayed) Labs Reviewed  CBC WITH DIFFERENTIAL/PLATELET - Abnormal; Notable for the following components:      Result Value   RBC 3.84 (*)    Hemoglobin 11.5 (*)    HCT 36.4 (*)    Platelets 517 (*)    All other components within normal limits  BASIC METABOLIC PANEL - Abnormal; Notable for the following components:   Glucose, Bld 522 (*)    Calcium 8.7 (*)    All other components within normal limits  RAPID URINE DRUG SCREEN, HOSP PERFORMED - Abnormal; Notable for the following components:   Cocaine POSITIVE (*)    All other components within normal limits  CBG MONITORING, ED - Abnormal; Notable for the following components:   Glucose-Capillary 454 (*)    All other components within normal limits  CBG MONITORING, ED - Abnormal; Notable for the following components:   Glucose-Capillary 370 (*)    All other components within normal limits    EKG None  Radiology No results found.  Procedures Procedures    Medications Ordered in ED Medications  sodium chloride 0.9 % bolus 1,000 mL (1,000 mLs Intravenous New Bag/Given 07/02/22 1050)  insulin aspart (novoLOG) injection 8 Units (8 Units Subcutaneous Given 07/02/22 1120)    ED Course/ Medical Decision Making/ A&P                             Medical Decision Making Amount and/or Complexity of Data Reviewed Labs: ordered.  Risk Prescription drug management.    Medical Screen Complete  This patient presented to the ED with complaint of hyperglycemia.  This complaint involves an extensive number of treatment options. The initial differential  diagnosis includes, but is not limited to, hyperglycemia, metabolic abnormality, etc.  This presentation is: Acute, Chronic, Self-Limited, Previously Undiagnosed, Uncertain Prognosis, Complicated, Systemic Symptoms, and Threat to Life/Bodily Function  Patient presents with complaint of hyperglycemia.  He denies associated nausea, vomiting, abdominal pain, fever, other complaint.  Patient admits to noncompliance with previously prescribed insulin.  He is taking his oral diabetes medications per report.  Glucose is mildly elevated.  With IV fluids and subcu insulin his glucose is correcting.  Other screening labs obtained are without  significant abnormality.  Patient remains asymptomatic.  Patient is encouraged to take his insulin.  Patient reports that he will "try harder to take it".  Reports close follow-up was stressed.  Strict return precautions given understood.  Additional history obtained:  External records from outside sources obtained and reviewed including prior ED visits and prior Inpatient records.    Lab Tests:  I ordered and personally interpreted labs.  The pertinent results include: CBC, BMP, CBG, urine tox Cardiac Monitoring:  The patient was maintained on a cardiac monitor.  I personally viewed and interpreted the cardiac monitor which showed an underlying rhythm of: NSR   Medicines ordered:  I ordered medication including the fluids, insulin for hyperglycemia  Reevaluation of the patient after these medicines showed that the patient: improved  Problem List / ED Course:  Hyperglycemia   Reevaluation:  After the interventions noted above, I reevaluated the patient and found that they have: improved  Disposition:  After consideration of the diagnostic results and the patients response to treatment, I feel that the patent would benefit from close outpatient follow-up.          Final Clinical Impression(s) / ED Diagnoses Final diagnoses:   Hyperglycemia    Rx / DC Orders ED Discharge Orders     None         Wynetta Fines, MD 07/02/22 1549

## 2022-07-02 NOTE — Discharge Instructions (Addendum)
Return for any problem.   Please take ALL medicines previously prescribed for your diabetes.   Do not use Cocaine.

## 2022-07-02 NOTE — ED Notes (Signed)
Urine at bedside. 

## 2022-07-03 ENCOUNTER — Ambulatory Visit: Payer: Medicaid Other | Admitting: Physician Assistant

## 2022-07-03 ENCOUNTER — Encounter: Payer: Self-pay | Admitting: Physician Assistant

## 2022-07-03 VITALS — BP 150/87 | Ht 68.0 in | Wt 153.0 lb

## 2022-07-03 DIAGNOSIS — Z794 Long term (current) use of insulin: Secondary | ICD-10-CM | POA: Diagnosis not present

## 2022-07-03 DIAGNOSIS — I10 Essential (primary) hypertension: Secondary | ICD-10-CM

## 2022-07-03 DIAGNOSIS — R739 Hyperglycemia, unspecified: Secondary | ICD-10-CM

## 2022-07-03 DIAGNOSIS — F1490 Cocaine use, unspecified, uncomplicated: Secondary | ICD-10-CM

## 2022-07-03 DIAGNOSIS — F17210 Nicotine dependence, cigarettes, uncomplicated: Secondary | ICD-10-CM

## 2022-07-03 DIAGNOSIS — Z5900 Homelessness unspecified: Secondary | ICD-10-CM

## 2022-07-03 DIAGNOSIS — F332 Major depressive disorder, recurrent severe without psychotic features: Secondary | ICD-10-CM | POA: Diagnosis not present

## 2022-07-03 DIAGNOSIS — E1165 Type 2 diabetes mellitus with hyperglycemia: Secondary | ICD-10-CM

## 2022-07-03 DIAGNOSIS — Z754 Unavailability and inaccessibility of other helping agencies: Secondary | ICD-10-CM

## 2022-07-03 DIAGNOSIS — Z5982 Transportation insecurity: Secondary | ICD-10-CM

## 2022-07-03 LAB — GLUCOSE, POCT (MANUAL RESULT ENTRY): POC Glucose: 372 mg/dl — AB (ref 70–99)

## 2022-07-03 MED ORDER — INSULIN ASPART 100 UNIT/ML IJ SOLN
10.0000 [IU] | Freq: Once | INTRAMUSCULAR | Status: AC
Start: 2022-07-03 — End: 2022-07-03
  Administered 2022-07-03: 10 [IU] via SUBCUTANEOUS
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
## 2022-07-03 NOTE — Patient Instructions (Signed)
I strongly encourage you to start taking your medications on a daily basis.  Make sure that you keep your follow-up appointment with Dr. Alvis Lemmings on April 30.  Please feel free to return to the mobile unit as you need to.  Roney Jaffe, PA-C Physician Assistant [facility] [email or web address]   Hyperglycemia Hyperglycemia occurs when the level of sugar (glucose) in the blood is too high. Glucose is a type of sugar that provides the body's main source of energy. Certain hormones (insulin and glucagon) control the level of glucose in the blood. Insulin lowers blood glucose, and glucagon increases blood glucose. Hyperglycemia can result from not having enough insulin in the bloodstream, or from the body not responding normally to insulin. Hyperglycemia occurs most often in people who have diabetes (diabetes mellitus), but it can happen in people who do not have diabetes. It can develop quickly, and it can be life-threatening if it causes you to become severely dehydrated (diabetic ketoacidosis or hyperglycemic hyperosmolar state). Severe hyperglycemia is a medical emergency. For most people with diabetes, a blood glucose level above 240 mg/dL is considered hyperglycemia. What are the causes? If you have diabetes, hyperglycemia may be caused by: Medicines that increase blood glucose or affect your diabetes control. Getting less physical activity. Eating more than planned. Being sick or injured, having an infection, or having surgery. Stress. Not giving yourself enough insulin (if you are taking insulin). If you have undiagnosed diabetes, this may be the reason you have hyperglycemia. If you do not have diabetes, hyperglycemia may be caused by: Certain medicines, including: Steroid medicines. Beta-blockers. Epinephrine. Thiazide diuretics. Stress. Having a serious illness, an infection, or surgery. Diseases of the pancreas. What  increases the risk? Hyperglycemia is more likely to develop in people who have risk factors for diabetes, such as: Having a family member with diabetes. Certain conditions in which the body's disease-fighting system (immune system) attacks itself (autoimmune disorders). Being overweight or obese. Having an inactive (sedentary) lifestyle. Having been diagnosed with insulin resistance. Having a history of prediabetes, gestational diabetes, or polycystic ovarian syndrome (PCOS). What are the signs or symptoms? Hyperglycemia may not cause any symptoms. If you do have symptoms, they may include: Increased thirst. Needing to urinate more often than usual. Hunger. Feeling very tired. Blurry vision. Other symptoms may develop if hyperglycemia gets worse, such as: Dry mouth. Abdominal pain. Loss of appetite. Fruity-smelling breath. Weakness. Unexpected weight loss. Tingling or numbness in the hands or feet. Headache. Cuts or bruises that are slow to heal. How is this diagnosed? Hyperglycemia is diagnosed with a blood test to measure your blood glucose level. This blood test is usually done while you are having symptoms. Your health care provider may also do a physical exam and review your medical history. You may have more tests to determine the cause of your hyperglycemia, such as: A fasting blood glucose (FBG) test. You will not be allowed to eat (you will fast) for at least 8 hours before a blood sample is taken. An A1C blood test. This provides information about blood glucose control over the previous 2-3 months. An oral glucose tolerance test (OGTT). This measures your blood glucose at two times: After fasting. This is your baseline blood glucose level. 2 hours after drinking a beverage that contains glucose. How is this treated? Treatment depends on the cause of your hyperglycemia. Treatment may include: Taking medicine to regulate your blood glucose levels. If you take insulin or  other diabetes medicines, your medicine  or dosage may be adjusted. Lifestyle changes, such as exercising more, eating healthier foods, or losing weight. Treating an illness or infection. Checking your blood glucose more often. Stopping or reducing steroid medicines. If your hyperglycemia becomes severe and it results in diabetic ketoacidosis or hyperglycemic hyperosmolar state, you must be hospitalized and given IV fluids and IV insulin. Follow these instructions at home: General instructions Take over-the-counter and prescription medicines only as told by your health care provider. Do not use any products that contain nicotine or tobacco. These products include cigarettes, chewing tobacco, and vaping devices, such as e-cigarettes. If you need help quitting, ask your health care provider. If you drink alcohol: Limit how much you have to: 0-1 drink a day for women who are not pregnant. 0-2 drinks a day for men. Know how much alcohol is in a drink. In the [place] one drink equals one 12 oz bottle of beer (355 mL), one 5 oz glass of wine (148 mL), or one 1 oz glass of hard liquor (44 mL). Learn to manage stress. If you need help with this, ask your health care provider. Do exercises as told by your health care provider. Keep all follow-up visits. This is important. Eating and drinking  Maintain a healthy weight. Stay hydrated, especially when you exercise, get sick, or spend time in hot temperatures. Drink enough fluid to keep your urine pale yellow. If you have diabetes:  Know the symptoms of hyperglycemia. Follow your diabetes management plan as told by your health care provider. Make sure you: Take your insulin and medicines as told. Follow your exercise plan. Follow your meal plan. Eat on time, and do not skip meals. Check your blood glucose as often as told. Make sure to check your blood glucose before and after exercise. If you exercise longer or in a different way, check your blood  glucose more often. Follow your sick day plan whenever you cannot eat or drink normally. Make this plan in advance with your health care provider. Share your diabetes management plan with people in your workplace, school, and household. Check your urine for ketones when you are ill and as told by your health care provider. Carry a medical alert card or wear medical alert jewelry. Where to find more information American Diabetes Association: www.diabetes.org Contact a health care provider if: Your blood glucose is at or above 240 mg/dL (60.4 mmol/L) for 2 days in a row. You have problems keeping your blood glucose in your target range. You have frequent episodes of hyperglycemia. You have signs of illness, such as nausea, vomiting, or fever. Get help right away if: Your blood glucose monitor reads "high" even when you are taking insulin. You have trouble breathing. You have a change in how you think, feel, or act (mental status). You have nausea or vomiting that does not go away. These symptoms may represent a serious problem that is an emergency. Do not wait to see if the symptoms will go away. Get medical help right away. Call your local emergency services (911 in the [place] Do not drive yourself to the hospital. Summary Hyperglycemia occurs when the level of sugar (glucose) in the blood is too high. Hyperglycemia can happen with or without diabetes, and severe hyperglycemia can be life-threatening. Hyperglycemia is diagnosed with a blood test to measure your blood glucose level. This blood test is usually done while you are having symptoms. Your health care provider may also do a physical exam and review your medical history. If  you have diabetes, follow your diabetes management plan as told by your health care provider. Contact your health care provider if you have problems keeping your blood glucose in your target range. This information is not intended to replace advice given to you by  your health care provider. Make sure you discuss any questions you have with your health care provider. Document Revised: 12/10/2019 Document Reviewed: 12/10/2019 Elsevier Patient Education  2023 [employer].

## 2022-07-04 ENCOUNTER — Emergency Department (HOSPITAL_COMMUNITY)
Admission: EM | Admit: 2022-07-04 | Discharge: 2022-07-04 | Disposition: A | Discharge status: Home / Self Care | Payer: Medicaid Other | Attending: Student | Admitting: Student

## 2022-07-04 ENCOUNTER — Encounter: Payer: Self-pay | Admitting: Physician Assistant

## 2022-07-04 ENCOUNTER — Encounter (HOSPITAL_COMMUNITY): Payer: Self-pay | Admitting: Oncology

## 2022-07-04 ENCOUNTER — Other Ambulatory Visit: Payer: Self-pay

## 2022-07-04 DIAGNOSIS — Z79899 Other long term (current) drug therapy: Secondary | ICD-10-CM | POA: Insufficient documentation

## 2022-07-04 DIAGNOSIS — Z794 Long term (current) use of insulin: Secondary | ICD-10-CM | POA: Diagnosis not present

## 2022-07-04 DIAGNOSIS — I10 Essential (primary) hypertension: Secondary | ICD-10-CM | POA: Insufficient documentation

## 2022-07-04 DIAGNOSIS — E1165 Type 2 diabetes mellitus with hyperglycemia: Secondary | ICD-10-CM | POA: Insufficient documentation

## 2022-07-04 DIAGNOSIS — R739 Hyperglycemia, unspecified: Secondary | ICD-10-CM

## 2022-07-04 DIAGNOSIS — Z7982 Long term (current) use of aspirin: Secondary | ICD-10-CM | POA: Diagnosis not present

## 2022-07-04 LAB — BLOOD GAS, VENOUS
Acid-Base Excess: 1.9 mmol/L (ref 0.0–2.0)
Bicarbonate: 26.6 mmol/L (ref 20.0–28.0)
O2 Saturation: 96.2 %
Patient temperature: 37
pCO2, Ven: 41 mmHg — ABNORMAL LOW (ref 44–60)
pH, Ven: 7.42 (ref 7.25–7.43)
pO2, Ven: 71 mmHg — ABNORMAL HIGH (ref 32–45)

## 2022-07-04 LAB — URINALYSIS, ROUTINE W REFLEX MICROSCOPIC
Bacteria, UA: NONE SEEN
Bilirubin Urine: NEGATIVE
Glucose, UA: >=500 mg/dL — AB
Hgb urine dipstick: NEGATIVE
Ketones, ur: NEGATIVE mg/dL
Nitrite: NEGATIVE
Protein, ur: NEGATIVE mg/dL
Specific Gravity, Urine: 1.017 (ref 1.005–1.030)
WBC, UA: >50 WBC/hpf (ref 0–5)
pH: 7 (ref 5.0–8.0)

## 2022-07-04 LAB — CBC WITH DIFFERENTIAL/PLATELET
Abs Immature Granulocytes: 0.05 10*3/uL (ref 0.00–0.07)
Basophils Absolute: 0.1 10*3/uL (ref 0.0–0.1)
Basophils Relative: 1 %
Eosinophils Absolute: 0.2 10*3/uL (ref 0.0–0.5)
Eosinophils Relative: 3 %
HCT: 37.1 % — ABNORMAL LOW (ref 39.0–52.0)
Hemoglobin: 11.7 g/dL — ABNORMAL LOW (ref 13.0–17.0)
Immature Granulocytes: 1 %
Lymphocytes Relative: 32 %
Lymphs Abs: 2.3 10*3/uL (ref 0.7–4.0)
MCH: 29.8 pg (ref 26.0–34.0)
MCHC: 31.5 g/dL (ref 30.0–36.0)
MCV: 94.4 fL (ref 80.0–100.0)
Monocytes Absolute: 0.6 10*3/uL (ref 0.1–1.0)
Monocytes Relative: 8 %
Neutro Abs: 4 10*3/uL (ref 1.7–7.7)
Neutrophils Relative %: 55 %
Platelets: 449 10*3/uL — ABNORMAL HIGH (ref 150–400)
RBC: 3.93 MIL/uL — ABNORMAL LOW (ref 4.22–5.81)
RDW: 13.2 % (ref 11.5–15.5)
WBC: 7.2 10*3/uL (ref 4.0–10.5)
nRBC: 0 % (ref 0.0–0.2)

## 2022-07-04 LAB — BASIC METABOLIC PANEL
Anion gap: 8 (ref 5–15)
BUN: 17 mg/dL (ref 8–23)
CO2: 24 mmol/L (ref 22–32)
Calcium: 8.7 mg/dL — ABNORMAL LOW (ref 8.9–10.3)
Chloride: 100 mmol/L (ref 98–111)
Creatinine, Ser: 1.01 mg/dL (ref 0.61–1.24)
GFR, Estimated: >60 mL/min (ref >60)
Glucose, Bld: 359 mg/dL — ABNORMAL HIGH (ref 70–99)
Potassium: 4 mmol/L (ref 3.5–5.1)
Sodium: 132 mmol/L — ABNORMAL LOW (ref 135–145)

## 2022-07-04 LAB — CBG MONITORING, ED
Glucose-Capillary: 362 mg/dL — ABNORMAL HIGH (ref 70–99)
Glucose-Capillary: 278 mg/dL — ABNORMAL HIGH (ref 70–99)

## 2022-07-04 MED ORDER — SODIUM CHLORIDE 0.9 % IV BOLUS
1000.0000 mL | Freq: Once | INTRAVENOUS | Status: AC
  Administered 2022-07-04: 1000 mL via INTRAVENOUS

## 2022-07-04 MED ORDER — INSULIN ASPART PROT & ASPART (70-30 MIX) 100 UNIT/ML ~~LOC~~ SUSP
8.0000 [IU] | Freq: Once | SUBCUTANEOUS | Status: AC
  Administered 2022-07-04: 8 [IU] via SUBCUTANEOUS
  Filled 2022-07-04: qty 10

## 2022-07-04 NOTE — Discharge Instructions (Signed)
All your lab work was reassuring, it is very important that you take your diabetes medication as uncontrolled diabetes can lead to chronic elements like strokes, heart attacks, permanent nerve damage.  Please follow your primary doctor as needed  Come back to the emergency department if you develop chest pain, shortness of breath, severe abdominal pain, uncontrolled nausea, vomiting, diarrhea.

## 2022-07-04 NOTE — ED Triage Notes (Signed)
Pt bib PTAR from [facility] d/t hyperglycemia after ingesting a large quantity of sweets. Known hx of diabetes, non compliant w/ medications.

## 2022-07-04 NOTE — ED Provider Notes (Signed)
EMERGENCY DEPARTMENT AT [facility] Provider Note   CSN: [id number] Arrival date & time: 07/04/22  0201     History  Chief Complaint  Patient presents with   Hyperglycemia    David Wyatt is a 62 y.o. male.  HPI   Patient with medical history including hypertension, diabetes, neuropathy, polysubstance use, homeless presented with complaints of elevated glucose.  Patient states that he ate a bunch of sweets and now his blood sugars elevated, states that he has not not been taking his blood sugar medication.  He states that he had urinary frequency without dysuria or hematuria, not endorsing any stomach pain suprapubic pain flank tenderness no associated nausea vomiting, no associate fever chills cough congestion.      Home Medications Prior to Admission medications   Medication Sig Start Date End Date Taking? Authorizing Provider  amLODipine (NORVASC) 10 MG tablet Take 1 tablet (10 mg total) by mouth daily. Patient not taking: Reported on 06/20/2022 04/10/22   Hoy Register, MD  atorvastatin (LIPITOR) 40 MG tablet Take 1 tablet (40 mg total) by mouth daily. Patient not taking: Reported on 06/20/2022 04/10/22   Hoy Register, MD  DULoxetine (CYMBALTA) 60 MG capsule Take 1 capsule (60 mg total) by mouth daily. 06/27/22   Ntuen, Jesusita Oka, FNP  famotidine (PEPCID) 10 MG tablet Take 1 tablet (10 mg total) by mouth 2 (two) times daily. Patient not taking: Reported on 06/20/2022 04/10/22   Hoy Register, MD  gabapentin (NEURONTIN) 300 MG capsule Take 2 capsules (600 mg total) by mouth 3 (three) times daily. Patient not taking: Reported on 06/20/2022 04/10/22   Hoy Register, MD  glipiZIDE (GLUCOTROL XL) 10 MG 24 hr tablet Take 1 tablet (10 mg total) by mouth daily with breakfast. 06/27/22   Ntuen, Jesusita Oka, FNP  insulin aspart (NOVOLOG) 100 UNIT/ML injection Inject 0-9 Units into the skin 3 (three) times daily with meals. 06/26/22   Ntuen, Jesusita Oka, FNP  insulin  glargine-yfgn (SEMGLEE) 100 UNIT/ML injection Inject 0.3 mLs (30 Units total) into the skin at bedtime. 06/26/22   Ntuen, Jesusita Oka, FNP  metFORMIN (GLUCOPHAGE) 1000 MG tablet Take 1 tablet (1,000 mg total) by mouth 2 (two) times daily with a meal. 06/26/22   Ntuen, Jesusita Oka, FNP  tamsulosin (FLOMAX) 0.4 MG CAPS capsule Take 1 capsule (0.4 mg total) by mouth daily. Patient not taking: Reported on 06/20/2022 04/10/22   Hoy Register, MD  traZODone (DESYREL) 100 MG tablet Take 1 tablet (100 mg total) by mouth at bedtime as needed for sleep. Patient not taking: Reported on 06/20/2022 04/10/22   Hoy Register, MD  loratadine (CLARITIN) 10 MG tablet Take 1 tablet (10 mg total) by mouth daily. Patient not taking: No sig reported 06/03/17 07/20/20  Loren Racer, MD      Allergies    Lisinopril    Review of Systems   Review of Systems  Constitutional:  Negative for chills and fever.  Respiratory:  Negative for shortness of breath.   Cardiovascular:  Negative for chest pain.  Gastrointestinal:  Negative for abdominal pain.  Genitourinary:  Positive for frequency.  Neurological:  Negative for headaches.    Physical Exam Updated Vital Signs BP (!) 142/98   Pulse 64   Temp 98.4 F (36.9 C) (Oral)   Resp 16   Ht 5\' 8"  (1.727 m)   Wt 72.6 kg   SpO2 97%   BMI 24.33 kg/m  Physical Exam Vitals and nursing note reviewed.  Constitutional:  General: He is not in acute distress.    Appearance: He is not ill-appearing.  HENT:     Head: Normocephalic and atraumatic.     Nose: No congestion.  Eyes:     Conjunctiva/sclera: Conjunctivae normal.  Cardiovascular:     Rate and Rhythm: Normal rate and regular rhythm.     Pulses: Normal pulses.     Heart sounds: No murmur heard.    No friction rub. No gallop.  Pulmonary:     Effort: No respiratory distress.     Breath sounds: No wheezing, rhonchi or rales.  Abdominal:     Palpations: Abdomen is soft.     Tenderness: There is no abdominal  tenderness. There is no right CVA tenderness or left CVA tenderness.  Skin:    General: Skin is warm and dry.  Neurological:     Mental Status: He is alert.  Psychiatric:        Mood and Affect: Mood normal.     ED Results / Procedures / Treatments   Labs (all labs ordered are listed, but only abnormal results are displayed) Labs Reviewed  BASIC METABOLIC PANEL - Abnormal; Notable for the following components:      Result Value   Sodium 132 (*)    Glucose, Bld 359 (*)    Calcium 8.7 (*)    All other components within normal limits  CBC WITH DIFFERENTIAL/PLATELET - Abnormal; Notable for the following components:   RBC 3.93 (*)    Hemoglobin 11.7 (*)    HCT 37.1 (*)    Platelets 449 (*)    All other components within normal limits  BLOOD GAS, VENOUS - Abnormal; Notable for the following components:   pCO2, Ven 41 (*)    pO2, Ven 71 (*)    All other components within normal limits  URINALYSIS, ROUTINE W REFLEX MICROSCOPIC - Abnormal; Notable for the following components:   Color, Urine STRAW (*)    APPearance HAZY (*)    Glucose, UA >=500 (*)    Leukocytes,Ua LARGE (*)    All other components within normal limits  CBG MONITORING, ED - Abnormal; Notable for the following components:   Glucose-Capillary 362 (*)    All other components within normal limits  CBG MONITORING, ED - Abnormal; Notable for the following components:   Glucose-Capillary 278 (*)    All other components within normal limits    EKG None  Radiology No results found.  Procedures Procedures    Medications Ordered in ED Medications  sodium chloride 0.9 % bolus 1,000 mL (1,000 mLs Intravenous New Bag/Given 07/04/22 0324)  insulin aspart protamine- aspart (NOVOLOG MIX 70/30) injection 8 Units (8 Units Subcutaneous Given 07/04/22 0434)    ED Course/ Medical Decision Making/ A&P                             Medical Decision Making Amount and/or Complexity of Data Reviewed Labs:  ordered.  Risk Prescription drug management.   This patient presents to the ED for concern of hyperglycemia, this involves an extensive number of treatment options, and is a complaint that carries with it a high risk of complications and morbidity.  The differential diagnosis includes DKA, HSS, sepsis    Additional history obtained:  Additional history obtained from N/A External records from outside source obtained and reviewed including recent ER notes   Co morbidities that complicate the patient evaluation  Diabetes  Social Determinants of  Health:  Homeless    Lab Tests:  I Ordered, and personally interpreted labs.  The pertinent results include: CBG is 362, UA shows leukocytes with many white blood cells, CBC shows normocytic anemia hemoglobin 11.7, BMP reveals sodium 132, glucose 359 calcium 8.7 VBG unremarkable   Imaging Studies ordered:  I ordered imaging studies including n/a  I independently visualized and interpreted imaging which showed n/a I agree with the radiologist interpretation   Cardiac Monitoring:  The patient was maintained on a cardiac monitor.  I personally viewed and interpreted the cardiac monitored which showed an underlying rhythm of: n/a   Medicines ordered and prescription drug management:  I ordered medication including fluids I have reviewed the patients home medicines and have made adjustments as needed  Critical Interventions:  N/a   Reevaluation:  Presented with concerns of hyperglycemia, will obtain basic lab workup provide fluids and continue to monitor  Lab work is negative for DKA, will provide him with NovoLog and reassess.  Reassess CBG is downtrending currently at 278, resting comfortably, will provide patient with cab voucher and/or bus pass for transportation home he is in agreement with discharge.      Consultations Obtained:  N/a    Test Considered:  N/a    Rule out Suspicion for DKA or HSS is very  low at this time as he has no anion gap, no decrease in CO2, pH has not decreased, glucose is also not significant elevated.  I doubt systemic infection patient is nontoxic-appearing vital signs are reassuring no leukocytosis.  Suspicion for pancreatitis is also low at this time no endorsing any abdominal tenderness, no epigastric tenderness present.  I doubt pyelo or kidney stone no flank tenderness, no CVA tenderness, UA is negative signs of hematuria    Dispostion and problem list  After consideration of the diagnostic results and the patients response to treatment, I feel that the patent would benefit from discharge.  Hyperglycemia-noncompliance, given a dose of NovoLog and fluids, will encourage him to take his diabetes medication and follow-up with PCP for further evaluation.  I confirmed patient does have his diabetes medication, all of his medications refilled on the 17th by his primary care provider.            Final Clinical Impression(s) / ED Diagnoses Final diagnoses:  Hyperglycemia    Rx / DC Orders ED Discharge Orders     None         Carroll Sage, PA-C 07/04/22 0553    Glendora Score, MD 07/05/22 [phone]

## 2022-07-08 ENCOUNTER — Other Ambulatory Visit: Payer: Self-pay

## 2022-07-09 ENCOUNTER — Ambulatory Visit: Payer: Medicaid Other | Admitting: Family Medicine

## 2022-07-12 ENCOUNTER — Encounter (HOSPITAL_COMMUNITY): Payer: Self-pay

## 2022-07-12 ENCOUNTER — Emergency Department (HOSPITAL_COMMUNITY): Payer: Medicaid Other

## 2022-07-12 ENCOUNTER — Emergency Department (HOSPITAL_COMMUNITY)
Admission: EM | Admit: 2022-07-12 | Discharge: 2022-07-12 | Disposition: A | Discharge status: Home / Self Care | Payer: Medicaid Other | Attending: Emergency Medicine | Admitting: Emergency Medicine

## 2022-07-12 DIAGNOSIS — S0083XA Contusion of other part of head, initial encounter: Secondary | ICD-10-CM | POA: Insufficient documentation

## 2022-07-12 DIAGNOSIS — R519 Headache, unspecified: Secondary | ICD-10-CM | POA: Diagnosis present

## 2022-07-12 DIAGNOSIS — Z7984 Long term (current) use of oral hypoglycemic drugs: Secondary | ICD-10-CM | POA: Insufficient documentation

## 2022-07-12 DIAGNOSIS — E119 Type 2 diabetes mellitus without complications: Secondary | ICD-10-CM | POA: Diagnosis not present

## 2022-07-12 DIAGNOSIS — I10 Essential (primary) hypertension: Secondary | ICD-10-CM | POA: Diagnosis not present

## 2022-07-12 DIAGNOSIS — Z794 Long term (current) use of insulin: Secondary | ICD-10-CM | POA: Diagnosis not present

## 2022-07-12 DIAGNOSIS — Z79899 Other long term (current) drug therapy: Secondary | ICD-10-CM | POA: Diagnosis not present

## 2022-07-12 DIAGNOSIS — Y92524 Gas station as the place of occurrence of the external cause: Secondary | ICD-10-CM | POA: Insufficient documentation

## 2022-07-12 LAB — ETHANOL: Alcohol, Ethyl (B): <10 mg/dL (ref <10)

## 2022-07-12 MED ORDER — FLUORESCEIN SODIUM 1 MG OP STRP
1.0000 | ORAL_STRIP | Freq: Once | OPHTHALMIC | Status: DC
  Filled 2022-07-12: qty 1

## 2022-07-12 MED ORDER — TETRACAINE HCL 0.5 % OP SOLN
1.0000 [drp] | Freq: Once | OPHTHALMIC | Status: DC
  Filled 2022-07-12: qty 4

## 2022-07-12 NOTE — ED Notes (Signed)
Pt is sleeping.  Rise and fall of chest noted. PT snoring.  Awaiting CT to recheck head after fall.

## 2022-07-12 NOTE — ED Notes (Signed)
Dr.Horton at the bedside to examine pts eyes. Pt became verbally aggressive and refused procedure. Pt educated on importance of procedure, but continues to refuse all treatment.

## 2022-07-12 NOTE — ED Notes (Signed)
Pt uncooperative with opening his eyes and answering questions in triage, becomes verbally agitated.
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
## 2022-07-12 NOTE — ED Notes (Signed)
Dc instructions reviewed with pt. PT verbalized understanding. PT DC 
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
## 2022-07-12 NOTE — ED Notes (Signed)
Pt taken to Ct. 
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
## 2022-07-12 NOTE — ED Notes (Signed)
Pt woke up needing to use the restroom.  PT ambulated to the restroom independently.  He was bent over a little and his gate was a little wide but he appeared fairly stable.

## 2022-07-15 ENCOUNTER — Other Ambulatory Visit: Payer: Self-pay

## 2022-07-15 ENCOUNTER — Emergency Department (HOSPITAL_COMMUNITY)
Admission: EM | Admit: 2022-07-15 | Discharge: 2022-07-16 | Disposition: A | Discharge status: Home / Self Care | Payer: Medicaid Other | Attending: Emergency Medicine | Admitting: Emergency Medicine

## 2022-07-15 DIAGNOSIS — I10 Essential (primary) hypertension: Secondary | ICD-10-CM | POA: Diagnosis not present

## 2022-07-15 DIAGNOSIS — F1410 Cocaine abuse, uncomplicated: Secondary | ICD-10-CM | POA: Insufficient documentation

## 2022-07-15 DIAGNOSIS — Z5900 Homelessness unspecified: Secondary | ICD-10-CM | POA: Diagnosis not present

## 2022-07-15 DIAGNOSIS — N390 Urinary tract infection, site not specified: Secondary | ICD-10-CM | POA: Insufficient documentation

## 2022-07-15 DIAGNOSIS — Z79899 Other long term (current) drug therapy: Secondary | ICD-10-CM | POA: Diagnosis not present

## 2022-07-15 DIAGNOSIS — E1165 Type 2 diabetes mellitus with hyperglycemia: Secondary | ICD-10-CM | POA: Insufficient documentation

## 2022-07-15 DIAGNOSIS — E1149 Type 2 diabetes mellitus with other diabetic neurological complication: Secondary | ICD-10-CM

## 2022-07-15 DIAGNOSIS — F330 Major depressive disorder, recurrent, mild: Secondary | ICD-10-CM | POA: Insufficient documentation

## 2022-07-15 DIAGNOSIS — Z7984 Long term (current) use of oral hypoglycemic drugs: Secondary | ICD-10-CM | POA: Diagnosis not present

## 2022-07-15 DIAGNOSIS — Z794 Long term (current) use of insulin: Secondary | ICD-10-CM | POA: Insufficient documentation

## 2022-07-15 DIAGNOSIS — F1994 Other psychoactive substance use, unspecified with psychoactive substance-induced mood disorder: Secondary | ICD-10-CM

## 2022-07-15 DIAGNOSIS — R45851 Suicidal ideations: Secondary | ICD-10-CM | POA: Insufficient documentation

## 2022-07-15 DIAGNOSIS — M5126 Other intervertebral disc displacement, lumbar region: Secondary | ICD-10-CM

## 2022-07-15 DIAGNOSIS — N39498 Other specified urinary incontinence: Secondary | ICD-10-CM

## 2022-07-15 DIAGNOSIS — R739 Hyperglycemia, unspecified: Secondary | ICD-10-CM | POA: Diagnosis present

## 2022-07-15 DIAGNOSIS — Z5901 Sheltered homelessness: Secondary | ICD-10-CM

## 2022-07-15 LAB — BASIC METABOLIC PANEL
Anion gap: 8 (ref 5–15)
BUN: 13 mg/dL (ref 8–23)
CO2: 23 mmol/L (ref 22–32)
Calcium: 8.5 mg/dL — ABNORMAL LOW (ref 8.9–10.3)
Chloride: 99 mmol/L (ref 98–111)
Creatinine, Ser: 1.25 mg/dL — ABNORMAL HIGH (ref 0.61–1.24)
GFR, Estimated: >60 mL/min (ref >60)
Glucose, Bld: 593 mg/dL (ref 70–99)
Potassium: 4.1 mmol/L (ref 3.5–5.1)
Sodium: 130 mmol/L — ABNORMAL LOW (ref 135–145)

## 2022-07-15 LAB — CBC
HCT: 36.2 % — ABNORMAL LOW (ref 39.0–52.0)
Hemoglobin: 12 g/dL — ABNORMAL LOW (ref 13.0–17.0)
MCH: 30.8 pg (ref 26.0–34.0)
MCHC: 33.1 g/dL (ref 30.0–36.0)
MCV: 92.8 fL (ref 80.0–100.0)
Platelets: 352 10*3/uL (ref 150–400)
RBC: 3.9 MIL/uL — ABNORMAL LOW (ref 4.22–5.81)
RDW: 13.2 % (ref 11.5–15.5)
WBC: 5.2 10*3/uL (ref 4.0–10.5)
nRBC: 0 % (ref 0.0–0.2)

## 2022-07-15 LAB — URINALYSIS, ROUTINE W REFLEX MICROSCOPIC
Bacteria, UA: NONE SEEN
Bilirubin Urine: NEGATIVE
Glucose, UA: >=500 mg/dL — AB
Ketones, ur: NEGATIVE mg/dL
Nitrite: NEGATIVE
Protein, ur: NEGATIVE mg/dL
Specific Gravity, Urine: 1.023 (ref 1.005–1.030)
WBC, UA: >50 WBC/hpf (ref 0–5)
pH: 6 (ref 5.0–8.0)

## 2022-07-15 LAB — CBG MONITORING, ED
Glucose-Capillary: >600 mg/dL (ref 70–99)
Glucose-Capillary: 466 mg/dL — ABNORMAL HIGH (ref 70–99)
Glucose-Capillary: 152 mg/dL — ABNORMAL HIGH (ref 70–99)
Glucose-Capillary: 165 mg/dL — ABNORMAL HIGH (ref 70–99)
Glucose-Capillary: 195 mg/dL — ABNORMAL HIGH (ref 70–99)

## 2022-07-15 MED ORDER — INSULIN ASPART 100 UNIT/ML IJ SOLN
10.0000 [IU] | Freq: Once | INTRAMUSCULAR | Status: AC
  Administered 2022-07-15: 10 [IU] via INTRAVENOUS

## 2022-07-15 MED ORDER — LACTATED RINGERS IV BOLUS
1000.0000 mL | Freq: Once | INTRAVENOUS | Status: AC
  Administered 2022-07-15: 1000 mL via INTRAVENOUS

## 2022-07-15 MED ORDER — INSULIN GLARGINE-YFGN 100 UNIT/ML ~~LOC~~ SOLN
30.0000 [IU] | Freq: Every day | SUBCUTANEOUS | Status: DC
  Administered 2022-07-15: 30 [IU] via SUBCUTANEOUS
  Filled 2022-07-15 (×2): qty 0.3

## 2022-07-15 MED ORDER — SODIUM CHLORIDE 0.9 % IV SOLN
1.0000 g | Freq: Once | INTRAVENOUS | Status: AC
  Administered 2022-07-15: 1 g via INTRAVENOUS
  Filled 2022-07-15: qty 10
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
## 2022-07-15 NOTE — ED Triage Notes (Signed)
Pt with hx DM 2 via EMS from the street for eval of hyperglycemia. [place] and has been without it since then. CBG reading "high" per EMS. Endorses polyuria and dry mouth.

## 2022-07-15 NOTE — ED Notes (Signed)
Date and time results received: 07/15/22 4:25 PM  (use smartphrase ".now" to insert current time)  Test: Glucose Critical Value: 593  Name of Provider Notified: [place], PA
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
## 2022-07-16 ENCOUNTER — Other Ambulatory Visit: Payer: Self-pay

## 2022-07-16 ENCOUNTER — Other Ambulatory Visit (HOSPITAL_COMMUNITY): Payer: Self-pay

## 2022-07-16 ENCOUNTER — Encounter (HOSPITAL_COMMUNITY): Payer: Self-pay

## 2022-07-16 DIAGNOSIS — F330 Major depressive disorder, recurrent, mild: Secondary | ICD-10-CM | POA: Insufficient documentation

## 2022-07-16 LAB — CBG MONITORING, ED
Glucose-Capillary: 204 mg/dL — ABNORMAL HIGH (ref 70–99)
Glucose-Capillary: 293 mg/dL — ABNORMAL HIGH (ref 70–99)
Glucose-Capillary: 334 mg/dL — ABNORMAL HIGH (ref 70–99)

## 2022-07-16 MED ORDER — FAMOTIDINE 10 MG PO TABS
10.0000 mg | ORAL_TABLET | Freq: Two times a day (BID) | ORAL | 0 refills | Status: DC
  Filled 2022-07-16: qty 60, 30d supply, fill #0

## 2022-07-16 MED ORDER — FLUCONAZOLE 150 MG PO TABS
150.0000 mg | ORAL_TABLET | Freq: Every day | ORAL | Status: DC
  Administered 2022-07-16: 150 mg via ORAL
  Filled 2022-07-16: qty 1

## 2022-07-16 MED ORDER — FLUCONAZOLE 150 MG PO TABS
150.0000 mg | ORAL_TABLET | Freq: Every day | ORAL | 0 refills | Status: AC
  Filled 2022-07-16: qty 7, 7d supply, fill #0

## 2022-07-16 MED ORDER — ATORVASTATIN CALCIUM 40 MG PO TABS
40.0000 mg | ORAL_TABLET | Freq: Every day | ORAL | Status: DC
  Administered 2022-07-16: 40 mg via ORAL
  Filled 2022-07-16: qty 1

## 2022-07-16 MED ORDER — INSULIN ASPART 100 UNIT/ML IJ SOLN: 0.0000 [IU] | Freq: Three times a day (TID) | INTRAMUSCULAR | Status: DC

## 2022-07-16 MED ORDER — AMLODIPINE BESYLATE 5 MG PO TABS
10.0000 mg | ORAL_TABLET | Freq: Every day | ORAL | Status: DC
  Administered 2022-07-16: 10 mg via ORAL
  Filled 2022-07-16: qty 2

## 2022-07-16 MED ORDER — CEPHALEXIN 250 MG PO CAPS
500.0000 mg | ORAL_CAPSULE | Freq: Three times a day (TID) | ORAL | Status: DC
  Administered 2022-07-16 (×2): 500 mg via ORAL
  Filled 2022-07-16 (×2): qty 2

## 2022-07-16 MED ORDER — TRAZODONE HCL 100 MG PO TABS
100.0000 mg | ORAL_TABLET | Freq: Every evening | ORAL | 0 refills | Status: DC | PRN
  Filled 2022-07-16: qty 30, 30d supply, fill #0

## 2022-07-16 MED ORDER — INSULIN ASPART 100 UNIT/ML IJ SOLN
0.0000 [IU] | Freq: Three times a day (TID) | INTRAMUSCULAR | Status: DC
  Administered 2022-07-16: 5 [IU] via SUBCUTANEOUS
  Administered 2022-07-16: 3 [IU] via SUBCUTANEOUS

## 2022-07-16 MED ORDER — METFORMIN HCL 500 MG PO TABS
1000.0000 mg | ORAL_TABLET | Freq: Two times a day (BID) | ORAL | Status: DC
  Administered 2022-07-16: 1000 mg via ORAL
  Filled 2022-07-16: qty 2

## 2022-07-16 MED ORDER — CEPHALEXIN 500 MG PO CAPS
500.0000 mg | ORAL_CAPSULE | Freq: Three times a day (TID) | ORAL | 0 refills | Status: DC
  Filled 2022-07-16: qty 21, 7d supply, fill #0

## 2022-07-16 MED ORDER — METFORMIN HCL 1000 MG PO TABS
1000.0000 mg | ORAL_TABLET | Freq: Two times a day (BID) | ORAL | 0 refills | Status: DC
  Filled 2022-07-16: qty 60, 30d supply, fill #0

## 2022-07-16 MED ORDER — INSULIN ASPART 100 UNIT/ML FLEXPEN
0.0000 [IU] | PEN_INJECTOR | Freq: Three times a day (TID) | SUBCUTANEOUS | 0 refills | Status: DC
  Filled 2022-07-16: qty 6, 30d supply, fill #0

## 2022-07-16 MED ORDER — TAMSULOSIN HCL 0.4 MG PO CAPS
0.4000 mg | ORAL_CAPSULE | Freq: Every day | ORAL | 0 refills | Status: DC
  Filled 2022-07-16: qty 30, 30d supply, fill #0

## 2022-07-16 MED ORDER — TAMSULOSIN HCL 0.4 MG PO CAPS
0.4000 mg | ORAL_CAPSULE | Freq: Every day | ORAL | Status: DC
  Administered 2022-07-16: 0.4 mg via ORAL
  Filled 2022-07-16: qty 1

## 2022-07-16 MED ORDER — INSULIN GLARGINE 100 UNIT/ML SOLOSTAR PEN
30.0000 [IU] | PEN_INJECTOR | Freq: Every day | SUBCUTANEOUS | 0 refills | Status: DC
  Filled 2022-07-16: qty 6, 20d supply, fill #0

## 2022-07-16 MED ORDER — ATORVASTATIN CALCIUM 40 MG PO TABS
40.0000 mg | ORAL_TABLET | Freq: Every day | ORAL | 0 refills | Status: DC
Start: 2022-07-16 — End: 2022-07-25
  Filled 2022-07-16: qty 30, 30d supply, fill #0

## 2022-07-16 MED ORDER — GLIPIZIDE ER 10 MG PO TB24
10.0000 mg | ORAL_TABLET | Freq: Every day | ORAL | Status: DC
  Administered 2022-07-16: 10 mg via ORAL
  Filled 2022-07-16: qty 1

## 2022-07-16 MED ORDER — DULOXETINE HCL 60 MG PO CPEP
60.0000 mg | ORAL_CAPSULE | Freq: Every day | ORAL | 0 refills | Status: DC
  Filled 2022-07-16: qty 30, 30d supply, fill #0

## 2022-07-16 MED ORDER — ACETAMINOPHEN 325 MG PO TABS: 650.0000 mg | ORAL_TABLET | ORAL | Status: DC | PRN

## 2022-07-16 MED ORDER — GLIPIZIDE ER 10 MG PO TB24
10.0000 mg | ORAL_TABLET | Freq: Every day | ORAL | 0 refills | Status: DC
  Filled 2022-07-16: qty 30, 30d supply, fill #0

## 2022-07-16 MED ORDER — AMLODIPINE BESYLATE 10 MG PO TABS
10.0000 mg | ORAL_TABLET | Freq: Every day | ORAL | 0 refills | Status: DC
Start: 2022-07-16 — End: 2022-07-25
  Filled 2022-07-16: qty 30, 30d supply, fill #0

## 2022-07-16 MED ORDER — INSULIN GLARGINE-YFGN 100 UNIT/ML ~~LOC~~ SOLN: 30.0000 [IU] | Freq: Every day | SUBCUTANEOUS | Status: DC

## 2022-07-16 MED ORDER — DULOXETINE HCL 60 MG PO CPEP
60.0000 mg | ORAL_CAPSULE | Freq: Every day | ORAL | Status: DC
  Administered 2022-07-16: 60 mg via ORAL
  Filled 2022-07-16: qty 2
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
## 2022-07-16 NOTE — ED Provider Notes (Signed)
Emergency Medicine Observation Re-evaluation Note  Lemarion Walsingham is a 62 y.o. male, seen on rounds today.  Pt initially presented to the ED for complaints of recent stressor, was feeling depressed/stressed. Feeling better today, [facility] team has reassessed and psych cleared for d/c. No new c/o.   Physical Exam  BP (!) 157/87 (BP Location: Left Arm)   Pulse 60   Temp 98.2 F (36.8 C) (Oral)   Resp 16   Ht 1.727 m (5\' 8" )   Wt 70.8 kg   SpO2 95%   BMI 23.72 kg/m  Physical Exam General: alert, content, nad.  Cardiac: regular rate.  Lungs: breathing comfortably. Psych: normal mood and affect.   ED Course / MDM    I have reviewed the labs performed to date as well as medications administered while in observation.  Recent changes in the last 24 hours include ED obs, reassessment.   Plan  [facility] team has psych cleared for d/c.   Resources provided.   TOC consulted for med assistance and assistance with wheelchair.  Additional social service resources provided.   Pt currently appears stable for d/c.       Cathren Laine, MD 07/16/22 1037

## 2022-07-16 NOTE — Discharge Instructions (Addendum)
It was our pleasure to provide your ER care today - we hope that you feel better. Drink plenty of fluids/stay well hydrated. Take antibiotic (keflex) as prescribed for possible urine infection.  Make sure to take your diabetes meds as prescribed. Check blood sugar 4x/day and record values. Follow diabetes meal plan. Follow up closely with primary care doctor this week.   Avoid drug use as it is harmful to your physical health and mental well-being. See resource guide attached in terms of accessing inpatient or outpatient substance use treatment programs.   Follow up closely with primary care doctor and behavioral health provider in the coming week.  For mental health issues and/or crisis, you may also go directly to the Behavioral Health Urgent Care Center - they are open 24/7 and walk-ins are welcome.    Return to ER if worse, new symptoms, fevers, chest pain, trouble breathing, or other emergency concern.       Outpatient psychiatric Services  Walk in hours for medication management Monday, Wednesday, Thursday, and Friday from 8:00 AM to 11:00 AM Recommend arriving by by 7:30 AM.  It is first come first serve.    Walk in hours for therapy intake Monday and Wednesday only 8:00 AM to 11:00 AM Encouraged to arrive by 7:30 AM.  It is first come first serve   Inpatient patient psychiatric services The [facility] offers comprehensive behavioral heath care services for mental health and substance abuse treatment.  Social work can also assist with referral to or getting you into a rehabilitation program short or long term  [facility] Monday - Friday: Services: 8:00AM - 3:00PM Offices: 8:00AM - 5:00PM  Physical Address [address] [place], [place] [address]   Please use this address for [facility] Mailing Address [address] [address] [place], [place] [address]  [facility] helps people reconnect This is a safe place to rest, take care of basic needs and  access the services and community that make all the difference. Our guests come to the [facility] to take a class, do laundry, meet with a case manager or to get their mail. Sometimes they just need to sit in our dayroom and enjoy a conversation.  Here you will find everything from shower facilities to a computer lab, a mail room, classrooms and meeting spaces.  The [facility] helps people reconnect with their own lives and with the community at large.  A caring community setting One of the most exciting aspects of the [facility] is that so many individuals and organizations in the community are a part of the everyday experience. Whether it's a hair stylist or law firm offering services right in-house, our partners make the [facility] a truly interactive resource center where services are brought to our guests. The [facility] brings together a comprehensive community of talented people who not only want to help solve problems, but also to be a part of our guests' lives.  Integrated Care We take a person-centered approach to assistance that includes: Case management Geneticist, molecular Medical clinic Mental health nurse Referrals  Fundamental Services We start with necessities: Midwife and Armed forces operational officer addresses and mailboxes Replacement IDs Onsite barbershop Storage lockers [place] winter warming center  Self-Sufficiency We connect our guests with: Skilled trade classes Job skills classes Resume and jobs application assistance Interview training GED Academic librarian
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
## 2022-07-16 NOTE — ED Notes (Signed)
Patient given a sandwich and water.
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
## 2022-07-17 LAB — URINE CULTURE

## 2022-07-20 ENCOUNTER — Emergency Department (HOSPITAL_COMMUNITY)
Admission: EM | Admit: 2022-07-20 | Discharge: 2022-07-20 | Disposition: A | Discharge status: Home / Self Care | Payer: Medicaid Other | Attending: Emergency Medicine | Admitting: Emergency Medicine

## 2022-07-20 ENCOUNTER — Encounter (HOSPITAL_COMMUNITY): Payer: Self-pay

## 2022-07-20 ENCOUNTER — Other Ambulatory Visit: Payer: Self-pay

## 2022-07-20 DIAGNOSIS — Z79899 Other long term (current) drug therapy: Secondary | ICD-10-CM | POA: Insufficient documentation

## 2022-07-20 DIAGNOSIS — Z7984 Long term (current) use of oral hypoglycemic drugs: Secondary | ICD-10-CM | POA: Diagnosis not present

## 2022-07-20 DIAGNOSIS — E1165 Type 2 diabetes mellitus with hyperglycemia: Secondary | ICD-10-CM | POA: Insufficient documentation

## 2022-07-20 DIAGNOSIS — Z794 Long term (current) use of insulin: Secondary | ICD-10-CM | POA: Diagnosis not present

## 2022-07-20 DIAGNOSIS — I10 Essential (primary) hypertension: Secondary | ICD-10-CM | POA: Diagnosis not present

## 2022-07-20 DIAGNOSIS — R739 Hyperglycemia, unspecified: Secondary | ICD-10-CM | POA: Diagnosis present

## 2022-07-20 DIAGNOSIS — F1490 Cocaine use, unspecified, uncomplicated: Secondary | ICD-10-CM | POA: Diagnosis not present

## 2022-07-20 DIAGNOSIS — N3000 Acute cystitis without hematuria: Secondary | ICD-10-CM

## 2022-07-20 LAB — I-STAT VENOUS BLOOD GAS, ED
Acid-Base Excess: 4 mmol/L — ABNORMAL HIGH (ref 0.0–2.0)
Bicarbonate: 29 mmol/L — ABNORMAL HIGH (ref 20.0–28.0)
Calcium, Ion: 1.21 mmol/L (ref 1.15–1.40)
HCT: 41 % (ref 39.0–52.0)
Hemoglobin: 13.9 g/dL (ref 13.0–17.0)
O2 Saturation: 99 %
Potassium: 4.6 mmol/L (ref 3.5–5.1)
Sodium: 135 mmol/L (ref 135–145)
TCO2: 30 mmol/L (ref 22–32)
pCO2, Ven: 44.5 mmHg (ref 44–60)
pH, Ven: 7.422 (ref 7.25–7.43)
pO2, Ven: 149 mmHg — ABNORMAL HIGH (ref 32–45)

## 2022-07-20 LAB — HEPATIC FUNCTION PANEL
ALT: 17 U/L (ref 0–44)
AST: 22 U/L (ref 15–41)
Albumin: 3.4 g/dL — ABNORMAL LOW (ref 3.5–5.0)
Alkaline Phosphatase: 110 U/L (ref 38–126)
Bilirubin, Direct: 0.1 mg/dL (ref 0.0–0.2)
Indirect Bilirubin: 0.5 mg/dL (ref 0.3–0.9)
Total Bilirubin: 0.6 mg/dL (ref 0.3–1.2)
Total Protein: 7.9 g/dL (ref 6.5–8.1)

## 2022-07-20 LAB — CBC
HCT: 40.2 % (ref 39.0–52.0)
Hemoglobin: 12.9 g/dL — ABNORMAL LOW (ref 13.0–17.0)
MCH: 29.8 pg (ref 26.0–34.0)
MCHC: 32.1 g/dL (ref 30.0–36.0)
MCV: 92.8 fL (ref 80.0–100.0)
Platelets: 439 K/uL — ABNORMAL HIGH (ref 150–400)
RBC: 4.33 MIL/uL (ref 4.22–5.81)
RDW: 13.2 % (ref 11.5–15.5)
WBC: 6.5 K/uL (ref 4.0–10.5)
nRBC: 0 % (ref 0.0–0.2)

## 2022-07-20 LAB — URINALYSIS, ROUTINE W REFLEX MICROSCOPIC
Bilirubin Urine: NEGATIVE
Glucose, UA: >=500 mg/dL — AB
Hgb urine dipstick: NEGATIVE
Ketones, ur: NEGATIVE mg/dL
Nitrite: NEGATIVE
Protein, ur: NEGATIVE mg/dL
Specific Gravity, Urine: 1.023 (ref 1.005–1.030)
WBC, UA: >50 WBC/hpf (ref 0–5)
pH: 6 (ref 5.0–8.0)

## 2022-07-20 LAB — BASIC METABOLIC PANEL WITH GFR
Anion gap: 11 (ref 5–15)
BUN: 16 mg/dL (ref 8–23)
CO2: 25 mmol/L (ref 22–32)
Calcium: 9.5 mg/dL (ref 8.9–10.3)
Chloride: 98 mmol/L (ref 98–111)
Creatinine, Ser: 1.16 mg/dL (ref 0.61–1.24)
GFR, Estimated: >60 mL/min (ref >60)
Glucose, Bld: 482 mg/dL — ABNORMAL HIGH (ref 70–99)
Potassium: 4.5 mmol/L (ref 3.5–5.1)
Sodium: 134 mmol/L — ABNORMAL LOW (ref 135–145)

## 2022-07-20 LAB — CBG MONITORING, ED
Glucose-Capillary: 492 mg/dL — ABNORMAL HIGH (ref 70–99)
Glucose-Capillary: 293 mg/dL — ABNORMAL HIGH (ref 70–99)
Glucose-Capillary: 94 mg/dL (ref 70–99)

## 2022-07-20 MED ORDER — LACTATED RINGERS IV BOLUS
1000.0000 mL | Freq: Once | INTRAVENOUS | Status: AC
  Administered 2022-07-20: 1000 mL via INTRAVENOUS

## 2022-07-20 MED ORDER — CEFADROXIL 500 MG PO CAPS
500.0000 mg | ORAL_CAPSULE | Freq: Two times a day (BID) | ORAL | 0 refills | Status: DC
  Filled 2022-07-20: qty 14, 7d supply, fill #0

## 2022-07-20 MED ORDER — ACETAMINOPHEN 500 MG PO TABS
1000.0000 mg | ORAL_TABLET | ORAL | Status: AC
  Administered 2022-07-20: 1000 mg via ORAL
  Filled 2022-07-20: qty 2

## 2022-07-20 MED ORDER — INSULIN ASPART 100 UNIT/ML IJ SOLN
10.0000 [IU] | Freq: Once | INTRAMUSCULAR | Status: AC
  Administered 2022-07-20: 10 [IU] via SUBCUTANEOUS

## 2022-07-20 NOTE — ED Notes (Signed)
Pt in bed, pt denies pain, pt states that he got his needles and is ready to go home, pt requests food and pants, pants and food given.  Pt verbalized understanding d/c and follow up, pa explained that pt was going to get an abx and he can pick it up at hte pharmacy.

## 2022-07-20 NOTE — ED Notes (Addendum)
Lab advised they received labs and urine and will be running them.

## 2022-07-20 NOTE — ED Triage Notes (Signed)
Pt recently seen for hyperglycemia. Pt is homeless and does not have ease of access to resources. Was given Insulin to take with him however states that he does not have the syringe to self inject.

## 2022-07-20 NOTE — Discharge Instructions (Addendum)
We have given you some additional needles to use.  Please use your insulin as prescribed please follow-up with your primary care doctor.
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
## 2022-07-20 NOTE — Care Management (Signed)
Consult to assist patient in getting his antibiotic. Patient has already been discharged.  He has  insurance therefore is ineligible for [facility] medication assistance.

## 2022-07-20 NOTE — ED Notes (Signed)
Pt in bed with eyes closed, pt arouses easily to verbal stim, pt denies pain, pt requests another blanket, blanket given.  Pt calm and cooperative, appreciative for care received.
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
## 2022-07-22 ENCOUNTER — Emergency Department (HOSPITAL_COMMUNITY)
Admission: EM | Admit: 2022-07-22 | Discharge: 2022-07-23 | Disposition: A | Discharge status: Home / Self Care | Payer: Medicaid Other | Attending: Emergency Medicine | Admitting: Emergency Medicine

## 2022-07-22 ENCOUNTER — Encounter (HOSPITAL_COMMUNITY): Payer: Self-pay

## 2022-07-22 ENCOUNTER — Other Ambulatory Visit: Payer: Self-pay

## 2022-07-22 DIAGNOSIS — E162 Hypoglycemia, unspecified: Secondary | ICD-10-CM

## 2022-07-22 DIAGNOSIS — Z794 Long term (current) use of insulin: Secondary | ICD-10-CM | POA: Diagnosis not present

## 2022-07-22 DIAGNOSIS — E11649 Type 2 diabetes mellitus with hypoglycemia without coma: Secondary | ICD-10-CM | POA: Diagnosis not present

## 2022-07-22 DIAGNOSIS — I10 Essential (primary) hypertension: Secondary | ICD-10-CM | POA: Diagnosis not present

## 2022-07-22 DIAGNOSIS — Z79899 Other long term (current) drug therapy: Secondary | ICD-10-CM | POA: Insufficient documentation

## 2022-07-22 LAB — I-STAT CHEM 8, ED
BUN: 22 mg/dL (ref 8–23)
Calcium, Ion: 1.24 mmol/L (ref 1.15–1.40)
Chloride: 104 mmol/L (ref 98–111)
Creatinine, Ser: 1.1 mg/dL (ref 0.61–1.24)
Glucose, Bld: 85 mg/dL (ref 70–99)
HCT: 42 % (ref 39.0–52.0)
Hemoglobin: 14.3 g/dL (ref 13.0–17.0)
Potassium: 3.5 mmol/L (ref 3.5–5.1)
Sodium: 143 mmol/L (ref 135–145)
TCO2: 28 mmol/L (ref 22–32)

## 2022-07-22 LAB — CBG MONITORING, ED
Glucose-Capillary: 68 mg/dL — ABNORMAL LOW (ref 70–99)
Glucose-Capillary: 126 mg/dL — ABNORMAL HIGH (ref 70–99)
Glucose-Capillary: 135 mg/dL — ABNORMAL HIGH (ref 70–99)

## 2022-07-22 NOTE — ED Provider Notes (Signed)
[place] EMERGENCY DEPARTMENT AT [facility] Provider Note   CSN: [id number] Arrival date & time: 07/22/22  2129     History {Add pertinent medical, surgical, social history, OB history to HPI:1} Chief Complaint  Patient presents with   Hypoglycemia    Javonnie Dicken is a 62 y.o. male.  62 year old male with a history of hypertension, diabetes, chronic back pain, cocaine abuse presents to the emergency department for hypoglycemia.  Was seen for hyperglycemia 2 days ago.  Began to notice that he was peeing more frequently around 1300 today.  Had his blood sugar checked at the [facility] which read "high".  He rechecked this a few hours later and it continued to read in the 600s.  He decided to take 60 units of his fast acting insulin at 1900 this evening as well as his normal prescribed 30 units of Lantus.  He began to notice this evening that his blood sugar was dropping more rapidly.  EMS was called to the [facility] and checked CBG which read 148.  5 minutes later EMS received CBG results of 80.  Decision was made to transport at this time.  Patient currently asymptomatic.  Denies fever, diaphoresis, N/V.  The history is provided by the patient. No language interpreter was used.  Hypoglycemia      Home Medications Prior to Admission medications   Medication Sig Start Date End Date Taking? Authorizing Provider  amLODipine (NORVASC) 10 MG tablet Take 1 tablet (10 mg total) by mouth daily. 07/16/22   Terrilee Files, MD  atorvastatin (LIPITOR) 40 MG tablet Take 1 tablet (40 mg total) by mouth daily. 07/16/22   Terrilee Files, MD  cefadroxil (DURICEF) 500 MG capsule Take 1 capsule (500 mg total) by mouth 2 (two) times daily. 07/20/22   Prosperi, Christian H, PA-C  cephALEXin (KEFLEX) 500 MG capsule Take 1 capsule (500 mg total) by mouth 3 (three) times daily. 07/16/22   Terrilee Files, MD  DULoxetine (CYMBALTA) 60 MG capsule Take 1 capsule (60 mg total) by mouth daily. 07/16/22   Terrilee Files, MD  famotidine (PEPCID) 10 MG tablet Take 1 tablet (10 mg total) by mouth 2 (two) times daily. 07/16/22   Terrilee Files, MD  fluconazole (DIFLUCAN) 150 MG tablet Take 1 tablet (150 mg total) by mouth daily for 7 days. 07/16/22 07/23/22  Terrilee Files, MD  gabapentin (NEURONTIN) 300 MG capsule Take 2 capsules (600 mg total) by mouth 3 (three) times daily. Patient not taking: Reported on 06/20/2022 04/10/22   Hoy Register, MD  glipiZIDE (GLUCOTROL XL) 10 MG 24 hr tablet Take 1 tablet (10 mg total) by mouth daily with breakfast. 07/16/22   Terrilee Files, MD  insulin aspart (NOVOLOG) 100 UNIT/ML FlexPen Inject 0-9 Units into the skin 3 (three) times daily with meals. 07/16/22   Terrilee Files, MD  insulin glargine (LANTUS) 100 UNIT/ML Solostar Pen Inject 30 Units into the skin at bedtime. 07/16/22   Terrilee Files, MD  metFORMIN (GLUCOPHAGE) 1000 MG tablet Take 1 tablet (1,000 mg total) by mouth 2 (two) times daily with a meal. 07/16/22   Terrilee Files, MD  tamsulosin (FLOMAX) 0.4 MG CAPS capsule Take 1 capsule (0.4 mg total) by mouth daily. 07/16/22   Terrilee Files, MD  traZODone (DESYREL) 100 MG tablet Take 1 tablet (100 mg total) by mouth at bedtime as needed for sleep. 07/16/22   Terrilee Files, MD  loratadine (CLARITIN) 10 MG  tablet Take 1 tablet (10 mg total) by mouth daily. Patient not taking: No sig reported 06/03/17 07/20/20  Loren Racer, MD      Allergies    Lisinopril    Review of Systems   Review of Systems Ten systems reviewed and are negative for acute change, except as noted in the HPI.    Physical Exam Updated Vital Signs BP 135/72 (BP Location: Right Arm)   Pulse 75   Temp 97.9 F (36.6 C) (Oral)   Resp 17   Ht 5\' 8"  (1.727 m)   Wt 72.6 kg   SpO2 96%   BMI 24.33 kg/m   Physical Exam Vitals and nursing note reviewed.  Constitutional:      General: He is not in acute distress.    Appearance: He is well-developed. He is not diaphoretic.      Comments: Nontoxic appearing and in NAD  HENT:     Head: Normocephalic and atraumatic.  Eyes:     General: No scleral icterus.    Conjunctiva/sclera: Conjunctivae normal.  Cardiovascular:     Rate and Rhythm: Normal rate and regular rhythm.     Pulses: Normal pulses.  Pulmonary:     Effort: Pulmonary effort is normal. No respiratory distress.     Comments: Respirations even and unlabored Musculoskeletal:        General: Normal range of motion.     Cervical back: Normal range of motion.  Skin:    General: Skin is warm and dry.     Coloration: Skin is not pale.     Findings: No erythema or rash.  Neurological:     Mental Status: He is alert and oriented to person, place, and time.  Psychiatric:        Behavior: Behavior normal.     ED Results / Procedures / Treatments   Labs (all labs ordered are listed, but only abnormal results are displayed) Labs Reviewed  CBG MONITORING, ED - Abnormal; Notable for the following components:      Result Value   Glucose-Capillary 68 (*)    All other components within normal limits  CBG MONITORING, ED - Abnormal; Notable for the following components:   Glucose-Capillary 126 (*)    All other components within normal limits  I-STAT CHEM 8, ED    EKG None  Radiology No results found.  Procedures Procedures  {Document cardiac monitor, telemetry assessment procedure when appropriate:1}  Medications Ordered in ED Medications - No data to display  ED Course/ Medical Decision Making/ A&P   {   Click here for ABCD2, HEART and other calculatorsREFRESH Note before signing :1}                          Medical Decision Making  ***  {Document critical care time when appropriate:1} {Document review of labs and clinical decision tools ie heart score, Chads2Vasc2 etc:1}  {Document your independent review of radiology images, and any outside records:1} {Document your discussion with family members, caretakers, and with  consultants:1} {Document social determinants of health affecting pt's care:1} {Document your decision making why or why not admission, treatments were needed:1} Final Clinical Impression(s) / ED Diagnoses Final diagnoses:  None    Rx / DC Orders ED Discharge Orders     None

## 2022-07-22 NOTE — ED Notes (Addendum)
  Pt was discharged from [facility] late last night high blood sugar. Someone checked his blood sugar at [facility] around 1400 and it read high. Pt took 60 units at of fast acting insulin at 7 pm and his normal is 5 units. EMS checks CBG and was 148, 5 minutes later it was 80.

## 2022-07-22 NOTE — ED Triage Notes (Signed)
Pt was discharged from [facility] late last night high blood sugar. Someone checked his blood sugar at [facility] around 1400 and it read high. Pt took 60 units at of fast acting insulin at 7 pm and his normal is 5 units. EMS checks CBG and was 148, 5 minutes later it was 80.

## 2022-07-23 LAB — CBG MONITORING, ED
Glucose-Capillary: 185 mg/dL — ABNORMAL HIGH (ref 70–99)
Glucose-Capillary: 231 mg/dL — ABNORMAL HIGH (ref 70–99)
Glucose-Capillary: 247 mg/dL — ABNORMAL HIGH (ref 70–99)
Glucose-Capillary: 255 mg/dL — ABNORMAL HIGH (ref 70–99)

## 2022-07-23 NOTE — Discharge Instructions (Signed)
Use your insulin as prescribed. Continue checking your sugars daily. Return for new or concerning symptoms.

## 2022-07-25 ENCOUNTER — Other Ambulatory Visit: Payer: Self-pay | Admitting: Pharmacist

## 2022-07-25 ENCOUNTER — Other Ambulatory Visit: Payer: Self-pay

## 2022-07-25 ENCOUNTER — Telehealth: Payer: Self-pay

## 2022-07-25 ENCOUNTER — Encounter: Payer: Self-pay | Admitting: Physician Assistant

## 2022-07-25 ENCOUNTER — Ambulatory Visit: Payer: Medicaid Other | Attending: Physician Assistant | Admitting: Physician Assistant

## 2022-07-25 VITALS — BP 134/92 | HR 82 | Wt 165.0 lb

## 2022-07-25 DIAGNOSIS — Z91148 Patient's other noncompliance with medication regimen for other reason: Secondary | ICD-10-CM | POA: Diagnosis not present

## 2022-07-25 DIAGNOSIS — Z7984 Long term (current) use of oral hypoglycemic drugs: Secondary | ICD-10-CM | POA: Insufficient documentation

## 2022-07-25 DIAGNOSIS — F332 Major depressive disorder, recurrent severe without psychotic features: Secondary | ICD-10-CM | POA: Diagnosis not present

## 2022-07-25 DIAGNOSIS — Z794 Long term (current) use of insulin: Secondary | ICD-10-CM | POA: Insufficient documentation

## 2022-07-25 DIAGNOSIS — E1165 Type 2 diabetes mellitus with hyperglycemia: Secondary | ICD-10-CM

## 2022-07-25 DIAGNOSIS — E782 Mixed hyperlipidemia: Secondary | ICD-10-CM | POA: Diagnosis not present

## 2022-07-25 DIAGNOSIS — N39498 Other specified urinary incontinence: Secondary | ICD-10-CM

## 2022-07-25 DIAGNOSIS — I10 Essential (primary) hypertension: Secondary | ICD-10-CM | POA: Diagnosis not present

## 2022-07-25 DIAGNOSIS — F1410 Cocaine abuse, uncomplicated: Secondary | ICD-10-CM | POA: Insufficient documentation

## 2022-07-25 DIAGNOSIS — F1490 Cocaine use, unspecified, uncomplicated: Secondary | ICD-10-CM

## 2022-07-25 DIAGNOSIS — R32 Unspecified urinary incontinence: Secondary | ICD-10-CM | POA: Diagnosis not present

## 2022-07-25 DIAGNOSIS — G4700 Insomnia, unspecified: Secondary | ICD-10-CM | POA: Insufficient documentation

## 2022-07-25 DIAGNOSIS — Z5901 Sheltered homelessness: Secondary | ICD-10-CM | POA: Insufficient documentation

## 2022-07-25 DIAGNOSIS — Z09 Encounter for follow-up examination after completed treatment for conditions other than malignant neoplasm: Secondary | ICD-10-CM

## 2022-07-25 LAB — GLUCOSE, POCT (MANUAL RESULT ENTRY)
POC Glucose: 354 mg/dl — AB (ref 70–99)
POC Glucose: 254 mg/dl — AB (ref 70–99)

## 2022-07-25 MED ORDER — DULOXETINE HCL 60 MG PO CPEP
60.0000 mg | ORAL_CAPSULE | Freq: Every day | ORAL | 0 refills | Status: DC
Start: 2022-07-25 — End: 2022-09-17
  Filled 2022-07-25: qty 30, 30d supply, fill #0

## 2022-07-25 MED ORDER — GLIPIZIDE ER 10 MG PO TB24
10.0000 mg | ORAL_TABLET | Freq: Every day | ORAL | 3 refills | Status: DC
Start: 2022-07-25 — End: 2023-05-28
  Filled 2022-07-25: qty 30, 30d supply, fill #0

## 2022-07-25 MED ORDER — ACCU-CHEK SOFTCLIX LANCETS MISC
6 refills | Status: DC
Start: 2022-07-25 — End: 2022-09-17
  Filled 2022-07-25: qty 100, 33d supply, fill #0

## 2022-07-25 MED ORDER — FAMOTIDINE 10 MG PO TABS
10.0000 mg | ORAL_TABLET | Freq: Two times a day (BID) | ORAL | 3 refills | Status: DC
  Filled 2022-07-25: qty 60, 30d supply, fill #0

## 2022-07-25 MED ORDER — TRUE METRIX METER W/DEVICE KIT
1.0000 | PACK | Freq: Two times a day (BID) | 0 refills | Status: DC
Start: 2022-07-25 — End: 2022-07-25
  Filled 2022-07-25: qty 1, fill #0

## 2022-07-25 MED ORDER — TRUE METRIX BLOOD GLUCOSE TEST VI STRP
ORAL_STRIP | 12 refills | Status: DC
Start: 2022-07-25 — End: 2022-07-25
  Filled 2022-07-25: qty 100, fill #0

## 2022-07-25 MED ORDER — AMLODIPINE BESYLATE 10 MG PO TABS
10.0000 mg | ORAL_TABLET | Freq: Every day | ORAL | 0 refills | Status: DC
Start: 2022-07-25 — End: 2023-02-20
  Filled 2022-07-25: qty 30, 30d supply, fill #0

## 2022-07-25 MED ORDER — INSULIN ASPART 100 UNIT/ML IJ SOLN
10.0000 [IU] | Freq: Once | INTRAMUSCULAR | Status: AC
Start: 2022-07-25 — End: 2022-07-25
  Administered 2022-07-25: 10 [IU] via SUBCUTANEOUS

## 2022-07-25 MED ORDER — ATORVASTATIN CALCIUM 40 MG PO TABS
40.0000 mg | ORAL_TABLET | Freq: Every day | ORAL | 4 refills | Status: DC
Start: 2022-07-25 — End: 2023-05-28
  Filled 2022-07-25: qty 30, 30d supply, fill #0

## 2022-07-25 MED ORDER — TAMSULOSIN HCL 0.4 MG PO CAPS
0.4000 mg | ORAL_CAPSULE | Freq: Every day | ORAL | 4 refills | Status: DC
Start: 2022-07-25 — End: 2022-12-05
  Filled 2022-07-25: qty 30, 30d supply, fill #0

## 2022-07-25 MED ORDER — TRAZODONE HCL 100 MG PO TABS
100.0000 mg | ORAL_TABLET | Freq: Every evening | ORAL | 3 refills | Status: DC | PRN
Start: 2022-07-25 — End: 2023-05-28
  Filled 2022-07-25: qty 30, 30d supply, fill #0

## 2022-07-25 MED ORDER — METFORMIN HCL 1000 MG PO TABS
1000.0000 mg | ORAL_TABLET | Freq: Two times a day (BID) | ORAL | 4 refills | Status: DC
Start: 2022-07-25 — End: 2023-04-01
  Filled 2022-07-25: qty 60, 30d supply, fill #0

## 2022-07-25 MED ORDER — TRUEPLUS LANCETS 28G MISC
1.0000 | Freq: Two times a day (BID) | 1 refills | Status: DC
Start: 2022-07-25 — End: 2022-07-25
  Filled 2022-07-25: qty 100, fill #0

## 2022-07-25 MED ORDER — ACCU-CHEK GUIDE VI STRP
ORAL_STRIP | 6 refills | Status: DC
Start: 2022-07-25 — End: 2022-09-17
  Filled 2022-07-25: qty 100, 25d supply, fill #0

## 2022-07-25 MED ORDER — ATORVASTATIN CALCIUM 40 MG PO TABS
40.0000 mg | ORAL_TABLET | Freq: Every day | ORAL | 0 refills | Status: DC
Start: 2022-07-25 — End: 2022-07-25
  Filled 2022-07-25: qty 30, 30d supply, fill #0

## 2022-07-25 MED ORDER — INSULIN GLARGINE 100 UNIT/ML SOLOSTAR PEN
30.0000 [IU] | PEN_INJECTOR | Freq: Every day | SUBCUTANEOUS | 3 refills | Status: DC
Start: 2022-07-25 — End: 2022-12-03
  Filled 2022-07-25: qty 15, 50d supply, fill #0

## 2022-07-25 MED ORDER — ACCU-CHEK GUIDE W/DEVICE KIT
PACK | 0 refills | Status: DC
  Filled 2022-07-25: qty 1, 1d supply, fill #0
  Filled 2022-07-25: qty 1, 30d supply, fill #0

## 2022-07-25 MED ORDER — INSULIN ASPART 100 UNIT/ML FLEXPEN
0.0000 [IU] | PEN_INJECTOR | Freq: Three times a day (TID) | SUBCUTANEOUS | 0 refills | Status: DC
Start: 2022-07-25 — End: 2022-09-17
  Filled 2022-07-25: qty 15, 75d supply, fill #0

## 2022-07-25 NOTE — Patient Instructions (Signed)
[place]org is the website for narcotics anonymous Nc23.org (website) or [phone] is the information for alcoholics anonymous Both are free and immediately available for help with alcohol and drug use  
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
## 2022-07-31 ENCOUNTER — Other Ambulatory Visit: Payer: Self-pay

## 2022-08-03 ENCOUNTER — Emergency Department (HOSPITAL_COMMUNITY)
Admission: EM | Admit: 2022-08-03 | Discharge: 2022-08-04 | Disposition: A | Discharge status: Home / Self Care | Payer: Medicaid Other | Attending: Emergency Medicine | Admitting: Emergency Medicine

## 2022-08-03 ENCOUNTER — Encounter (HOSPITAL_COMMUNITY): Payer: Self-pay | Admitting: Emergency Medicine

## 2022-08-03 ENCOUNTER — Other Ambulatory Visit: Payer: Self-pay

## 2022-08-03 DIAGNOSIS — M79604 Pain in right leg: Secondary | ICD-10-CM | POA: Insufficient documentation

## 2022-08-03 DIAGNOSIS — M79605 Pain in left leg: Secondary | ICD-10-CM | POA: Diagnosis not present

## 2022-08-03 NOTE — ED Triage Notes (Signed)
Patient BIB [facility] from [facility] c/o bilateral leg pain which is chronic d/t him sleeping on the floor.  Patient reports pain is non-traumatic.   132/88 109 HR 95% RA 166 CBG

## 2022-08-03 NOTE — ED Notes (Signed)
Patient given applesauce and diet shasta per request.

## 2022-08-04 MED ORDER — ACETAMINOPHEN 500 MG PO TABS
1000.0000 mg | ORAL_TABLET | Freq: Once | ORAL | Status: AC
  Administered 2022-08-04: 1000 mg via ORAL
  Filled 2022-08-04: qty 2

## 2022-08-04 NOTE — ED Notes (Signed)
Pt ambulated to bathroom with no difficulty 

## 2022-08-04 NOTE — Discharge Instructions (Signed)
Can continue tylenol or motrin as needed for pain. Follow-up with your doctor. Return here for new concerns. 

## 2022-08-04 NOTE — ED Provider Notes (Addendum)
[place] EMERGENCY DEPARTMENT AT [facility] Provider Note   CSN: [id number] Arrival date & time: 08/03/22  2151     History  Chief Complaint  Patient presents with   Leg Pain    David Wyatt is a 62 y.o. male.  The history is provided by the patient and medical records.  Leg Pain  62 y.o. M presenting to the ED for bilateral leg pain.  This is a chronic issue for him, worse recently because he has been having to sleep on the floor at local [facility] shelter with only a towel.  States shelter is otherwise full.  Denies any new falls/trauma.  No meds taken PTA.  Patient ambulatory to and from bathroom while in hallway bed.  Home Medications Prior to Admission medications   Medication Sig Start Date End Date Taking? Authorizing Provider  Accu-Chek Softclix Lancets lancets Use to check blood sugar three times daily. E11.65 07/25/22   Hoy Register, MD  amLODipine (NORVASC) 10 MG tablet Take 1 tablet (10 mg total) by mouth daily. 07/25/22   Anders Simmonds, PA-C  atorvastatin (LIPITOR) 40 MG tablet Take 1 tablet (40 mg total) by mouth daily. 07/25/22   Anders Simmonds, PA-C  Blood Glucose Monitoring Suppl (ACCU-CHEK GUIDE) w/Device KIT Use to check blood sugar three times daily. E11.65 07/25/22   Hoy Register, MD  cefadroxil (DURICEF) 500 MG capsule Take 1 capsule (500 mg total) by mouth 2 (two) times daily. 07/20/22   Prosperi, Christian H, PA-C  cephALEXin (KEFLEX) 500 MG capsule Take 1 capsule (500 mg total) by mouth 3 (three) times daily. 07/16/22   Terrilee Files, MD  DULoxetine (CYMBALTA) 60 MG capsule Take 1 capsule (60 mg total) by mouth daily. 07/25/22   Anders Simmonds, PA-C  famotidine (PEPCID) 10 MG tablet Take 1 tablet (10 mg total) by mouth 2 (two) times daily. 07/25/22   Anders Simmonds, PA-C  glipiZIDE (GLUCOTROL XL) 10 MG 24 hr tablet Take 1 tablet (10 mg total) by mouth daily with breakfast. 07/25/22   Anders Simmonds, PA-C  glucose blood (ACCU-CHEK  GUIDE) test strip Use to check blood sugar three times daily. E11.65 07/25/22   Hoy Register, MD  insulin aspart (NOVOLOG) 100 UNIT/ML FlexPen Inject 0-9 Units into the skin 3 (three) times daily with meals. 07/25/22   Anders Simmonds, PA-C  insulin glargine (LANTUS) 100 UNIT/ML Solostar Pen Inject 30 Units into the skin at bedtime. 07/25/22   Anders Simmonds, PA-C  metFORMIN (GLUCOPHAGE) 1000 MG tablet Take 1 tablet (1,000 mg total) by mouth 2 (two) times daily with a meal. 07/25/22   McClung, Marzella Schlein, PA-C  tamsulosin (FLOMAX) 0.4 MG CAPS capsule Take 1 capsule (0.4 mg total) by mouth daily. 07/25/22   Anders Simmonds, PA-C  traZODone (DESYREL) 100 MG tablet Take 1 tablet (100 mg total) by mouth at bedtime as needed for sleep. 07/25/22   Anders Simmonds, PA-C      Allergies    Lisinopril    Review of Systems   Review of Systems  Musculoskeletal:  Positive for arthralgias.  All other systems reviewed and are negative.   Physical Exam Updated Vital Signs BP 121/78 (BP Location: Right Arm)   Pulse (!) 107   Temp 99.4 F (37.4 C) (Oral)   Resp 18   Ht 5\' 8"  (1.727 m)   Wt 75 kg   SpO2 95%   BMI 25.14 kg/m   Physical Exam Vitals and  nursing note reviewed.  Constitutional:      Appearance: He is well-developed.  HENT:     Head: Normocephalic and atraumatic.  Eyes:     Conjunctiva/sclera: Conjunctivae normal.     Pupils: Pupils are equal, round, and reactive to light.  Cardiovascular:     Rate and Rhythm: Normal rate and regular rhythm.     Heart sounds: Normal heart sounds.  Pulmonary:     Effort: Pulmonary effort is normal. No respiratory distress.     Breath sounds: Normal breath sounds. No rhonchi.  Musculoskeletal:        General: Normal range of motion.     Cervical back: Normal range of motion.     Comments: Legs atraumatic in appearance without deformity, no erythema, dependent edema at ankles bilaterally which is grossly symmetric  Skin:    General: Skin is  warm and dry.  Neurological:     Mental Status: He is alert and oriented to person, place, and time.     ED Results / Procedures / Treatments   Labs (all labs ordered are listed, but only abnormal results are displayed) Labs Reviewed - No data to display  EKG None  Radiology No results found.  Procedures Procedures    Medications Ordered in ED Medications  acetaminophen (TYLENOL) tablet 1,000 mg (1,000 mg Oral Given 08/04/22 0258)    ED Course/ Medical Decision Making/ A&P                             Medical Decision Making Risk OTC drugs.   62 year old male presenting to the ED with bilateral leg pain.  This is somewhat of a chronic issue for him, worse recently as he has been having to sleep on the floor at the local [facility].  He does not have any deformities of the legs, no overlying erythema or induration.  He does appear to have some dependent edema of the ankles which is grossly symmetric.  He is ambulatory in the ED, witnessed by myself and RN.  He was given Tylenol for pain control, can continue same at home.  Encouraged to follow-up with PCP.  Can return here for new concerns.  Final Clinical Impression(s) / ED Diagnoses Final diagnoses:  Bilateral leg pain    Rx / DC Orders ED Discharge Orders     None         Garlon Hatchet, PA-C 08/04/22 0345    Garlon Hatchet, PA-C 08/04/22 0410    Shon Baton, MD 08/04/22 [phone]

## 2022-09-03 ENCOUNTER — Ambulatory Visit: Payer: Self-pay
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
## 2022-09-04 ENCOUNTER — Telehealth: Payer: Medicaid Other | Admitting: Physician Assistant

## 2022-09-04 NOTE — Progress Notes (Signed)
Appointment created for patient, link sent to join virtual visit, attempted to call listed number 3 times, left two voicemails.  Roney Jaffe, PA-C Physician Assistant [facility] [email or web address]

## 2022-09-04 NOTE — Telephone Encounter (Signed)
Patient is receptive to a virtual appt.  Set up with Mobile Medicine, 6/26 at 12.

## 2022-09-05 ENCOUNTER — Other Ambulatory Visit: Payer: Self-pay

## 2022-09-05 ENCOUNTER — Encounter (HOSPITAL_COMMUNITY): Payer: Self-pay

## 2022-09-05 ENCOUNTER — Ambulatory Visit: Payer: Self-pay

## 2022-09-05 ENCOUNTER — Emergency Department (HOSPITAL_COMMUNITY)
Admission: EM | Admit: 2022-09-05 | Discharge: 2022-09-05 | Disposition: A | Discharge status: Home / Self Care | Payer: Medicaid Other | Attending: Emergency Medicine | Admitting: Emergency Medicine

## 2022-09-05 ENCOUNTER — Ambulatory Visit: Payer: Self-pay | Admitting: *Deleted

## 2022-09-05 DIAGNOSIS — Z91148 Patient's other noncompliance with medication regimen for other reason: Secondary | ICD-10-CM | POA: Insufficient documentation

## 2022-09-05 DIAGNOSIS — R739 Hyperglycemia, unspecified: Secondary | ICD-10-CM

## 2022-09-05 DIAGNOSIS — E1165 Type 2 diabetes mellitus with hyperglycemia: Secondary | ICD-10-CM | POA: Diagnosis not present

## 2022-09-05 DIAGNOSIS — I10 Essential (primary) hypertension: Secondary | ICD-10-CM | POA: Insufficient documentation

## 2022-09-05 LAB — CBC WITH DIFFERENTIAL/PLATELET
Abs Immature Granulocytes: 0.02 10*3/uL (ref 0.00–0.07)
Basophils Absolute: 0 10*3/uL (ref 0.0–0.1)
Basophils Relative: 1 %
Eosinophils Absolute: 0.2 10*3/uL (ref 0.0–0.5)
Eosinophils Relative: 3 %
HCT: 39.3 % (ref 39.0–52.0)
Hemoglobin: 12.7 g/dL — ABNORMAL LOW (ref 13.0–17.0)
Immature Granulocytes: 0 %
Lymphocytes Relative: 21 %
Lymphs Abs: 1.3 10*3/uL (ref 0.7–4.0)
MCH: 29.5 pg (ref 26.0–34.0)
MCHC: 32.3 g/dL (ref 30.0–36.0)
MCV: 91.4 fL (ref 80.0–100.0)
Monocytes Absolute: 0.6 10*3/uL (ref 0.1–1.0)
Monocytes Relative: 10 %
Neutro Abs: 4.2 10*3/uL (ref 1.7–7.7)
Neutrophils Relative %: 65 %
Platelets: 380 10*3/uL (ref 150–400)
RBC: 4.3 MIL/uL (ref 4.22–5.81)
RDW: 13.2 % (ref 11.5–15.5)
WBC: 6.3 10*3/uL (ref 4.0–10.5)
nRBC: 0 % (ref 0.0–0.2)

## 2022-09-05 LAB — BASIC METABOLIC PANEL
Anion gap: 9 (ref 5–15)
BUN: 26 mg/dL — ABNORMAL HIGH (ref 8–23)
CO2: 24 mmol/L (ref 22–32)
Calcium: 8.6 mg/dL — ABNORMAL LOW (ref 8.9–10.3)
Chloride: 96 mmol/L — ABNORMAL LOW (ref 98–111)
Creatinine, Ser: 1.4 mg/dL — ABNORMAL HIGH (ref 0.61–1.24)
GFR, Estimated: 57 mL/min — ABNORMAL LOW (ref >60)
Glucose, Bld: 706 mg/dL (ref 70–99)
Potassium: 4.5 mmol/L (ref 3.5–5.1)
Sodium: 129 mmol/L — ABNORMAL LOW (ref 135–145)

## 2022-09-05 LAB — CBG MONITORING, ED
Glucose-Capillary: >600 mg/dL (ref 70–99)
Glucose-Capillary: 570 mg/dL (ref 70–99)

## 2022-09-05 MED ORDER — INSULIN ASPART PROT & ASPART (70-30 MIX) 100 UNIT/ML ~~LOC~~ SUSP
6.0000 [IU] | Freq: Once | SUBCUTANEOUS | Status: AC
  Administered 2022-09-05: 6 [IU] via SUBCUTANEOUS
  Filled 2022-09-05: qty 10

## 2022-09-05 MED ORDER — INSULIN GLARGINE-YFGN 100 UNIT/ML ~~LOC~~ SOLN
30.0000 [IU] | Freq: Once | SUBCUTANEOUS | Status: AC
  Administered 2022-09-05: 30 [IU] via SUBCUTANEOUS
  Filled 2022-09-05: qty 0.3

## 2022-09-05 MED ORDER — LACTATED RINGERS IV BOLUS
1000.0000 mL | Freq: Once | INTRAVENOUS | Status: AC
  Administered 2022-09-05: 1000 mL via INTRAVENOUS

## 2022-09-05 NOTE — Telephone Encounter (Signed)
Spoke with patient . Verified name & DOB   To advised that the next available appointment in the office is 09/26/2022 and the mobile unit would be the only appointment that I could offer within the request time fame. Patient voiced that he was on the way to the hospital now because he fell again.
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
## 2022-09-05 NOTE — ED Triage Notes (Signed)
Patient arrived with medics from [facility]. States BLE leg cramps. Reports has been hurting after a fall 3 weeks ago. Patient also states he ran out of his routine medications a few weeks ago. Pt is Hypertensive and diabetic. AAOX4, MAE, rep even and unlabored

## 2022-09-05 NOTE — Telephone Encounter (Signed)
  Chief Complaint: patient called for appointment- declines to answer questions Symptoms: recent fall x2 and pain  Disposition: [] ED /[] Urgent Care (no appt availability in office) / [] Appointment(In office/virtual)/ []  [place] [facility]/ [] Home Care/ [x] Refused Recommended Disposition /[] [place] [facility]/ []  Follow-up with PCP Additional Notes: Patient is seeking appointment in office- no open appointment- [facility] advised as option- he states not near him

## 2022-09-05 NOTE — Telephone Encounter (Signed)
Patient called for an appointment- he states he has fallen twice and he is in pain. Patient does not want to be triaged- he just wants an appointment this week.  There are no appointments available within this timeframe- will send message to the office for review  Reason for Disposition  Requesting regular office appointment  Answer Assessment - Initial Assessment Questions 1. REASON FOR CALL or QUESTION: "What is your reason for calling today?" or "How can I best help you?" or "What question do you have that I can help answer?"     Patient wants appointment- does not want to discuss symptoms  Protocols used: Information Only Call - No Triage-A-AH
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
## 2022-09-05 NOTE — ED Notes (Signed)
Urine sample sent to lab

## 2022-09-05 NOTE — Telephone Encounter (Signed)
Pt called. Pt terminated the conversation d/t a lot of people being around him at this time. Pt stated he will call back at 8.

## 2022-09-06 ENCOUNTER — Telehealth: Payer: Self-pay | Admitting: Family Medicine

## 2022-09-06 NOTE — Telephone Encounter (Signed)
Copied from CRM [id number]. Topic: General - Other >> Sep 05, 2022  4:06 PM Dominique A wrote: Reason for CRM: Please view Nurse Triage TE from today. Pt is calling back stating that he is at the hospital but was told that he is going to be released due to his case manager stating that he has to get his medication first. Pt is wanting to know who his case manager is and why he has to be released from the hospital. Please advise.

## 2022-09-06 NOTE — Telephone Encounter (Signed)
David Penning do you know anything about this?

## 2022-09-09 NOTE — Telephone Encounter (Signed)
Cal returned to patient: [phone] , message left with call back requested.  He was most likely referring to the hospital case manager that was helping him with medications

## 2022-09-11 NOTE — Telephone Encounter (Signed)
I tried to reach the patient again and the recording stated that the call cannot be completed at this time

## 2022-09-16 ENCOUNTER — Other Ambulatory Visit: Payer: Self-pay

## 2022-09-16 NOTE — Telephone Encounter (Signed)
I called the patient back and explained that I was returning his call from when he was in the hospital. He then said that he has an appointment at [facility] tomorrow , 09/17/2022 @ 1530 and he needs to see his doctor  because he needs to make sure that he has all of his medications.  He then said he needs a ride. He was not aware that he can call DSS for transportation to medical appointments.  I offered to schedule a cab ride and he was agreeable. He provided me with his current address. I told him that the cab would pick him up at 1445 and he said he would be ready.  I then spoke to Roland/ [facility] and arranged the ride.  Voucher emailed to [employer]

## 2022-09-16 NOTE — Telephone Encounter (Signed)
Pt is returning call.  

## 2022-09-16 NOTE — Telephone Encounter (Unsigned)
Copied from CRM [id number]. Topic: General - Other >> Sep 16, 2022  2:25 PM Santiya F wrote: Reason for CRM: Pt is calling in returning a call from the office.

## 2022-09-17 ENCOUNTER — Ambulatory Visit: Payer: MEDICAID | Attending: Family Medicine | Admitting: Family Medicine

## 2022-09-17 ENCOUNTER — Telehealth: Payer: Self-pay

## 2022-09-17 ENCOUNTER — Other Ambulatory Visit: Payer: Self-pay

## 2022-09-17 ENCOUNTER — Encounter: Payer: Self-pay | Admitting: Family Medicine

## 2022-09-17 VITALS — BP 151/89 | HR 75 | Temp 97.6°F | Wt 156.2 lb

## 2022-09-17 DIAGNOSIS — Z794 Long term (current) use of insulin: Secondary | ICD-10-CM

## 2022-09-17 DIAGNOSIS — E1165 Type 2 diabetes mellitus with hyperglycemia: Secondary | ICD-10-CM | POA: Diagnosis not present

## 2022-09-17 DIAGNOSIS — Z91141 Patient's other noncompliance with medication regimen due to financial hardship: Secondary | ICD-10-CM | POA: Diagnosis not present

## 2022-09-17 DIAGNOSIS — F332 Major depressive disorder, recurrent severe without psychotic features: Secondary | ICD-10-CM

## 2022-09-17 DIAGNOSIS — E1159 Type 2 diabetes mellitus with other circulatory complications: Secondary | ICD-10-CM

## 2022-09-17 DIAGNOSIS — I152 Hypertension secondary to endocrine disorders: Secondary | ICD-10-CM

## 2022-09-17 LAB — GLUCOSE, POCT (MANUAL RESULT ENTRY): POC Glucose: 571 mg/dl — AB (ref 70–99)

## 2022-09-17 LAB — POCT GLYCOSYLATED HEMOGLOBIN (HGB A1C): Hemoglobin A1C: 14.7 % — AB (ref 4.0–5.6)

## 2022-09-17 MED ORDER — INSULIN ASPART 100 UNIT/ML FLEXPEN
0.0000 [IU] | PEN_INJECTOR | Freq: Three times a day (TID) | SUBCUTANEOUS | 1 refills | Status: DC
Start: 2022-09-17 — End: 2023-04-01
  Filled 2022-09-17: qty 9, 30d supply, fill #0

## 2022-09-17 MED ORDER — DULOXETINE HCL 60 MG PO CPEP
60.0000 mg | ORAL_CAPSULE | Freq: Every day | ORAL | 1 refills | Status: DC
Start: 2022-09-17 — End: 2023-05-28
  Filled 2022-09-17: qty 90, 90d supply, fill #0

## 2022-09-17 MED ORDER — INSULIN ASPART 100 UNIT/ML IJ SOLN
25.0000 [IU] | Freq: Once | INTRAMUSCULAR | Status: AC
Start: 2022-09-17 — End: 2022-09-17
  Administered 2022-09-17: 25 [IU] via SUBCUTANEOUS

## 2022-09-17 MED ORDER — ACCU-CHEK SOFTCLIX LANCETS MISC
6 refills | Status: DC
Start: 2022-09-17 — End: 2023-04-08
  Filled 2022-09-17: qty 100, 34d supply, fill #0

## 2022-09-17 MED ORDER — ACCU-CHEK GUIDE VI STRP
ORAL_STRIP | 6 refills | Status: DC
Start: 2022-09-17 — End: 2023-02-19
  Filled 2022-09-17: qty 100, 34d supply, fill #0

## 2022-09-17 MED ORDER — ACCU-CHEK GUIDE W/DEVICE KIT
PACK | 0 refills | Status: DC
Start: 2022-09-17 — End: 2023-04-01
  Filled 2022-09-17: qty 1, 30d supply, fill #0

## 2022-09-17 NOTE — Addendum Note (Signed)
Addended by: Elsie Lincoln F on: 09/17/2022 06:02 PM   Modules accepted: Orders
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
## 2022-09-17 NOTE — Progress Notes (Signed)
Novolog 25 units given left lower abd. SQ

## 2022-09-17 NOTE — Telephone Encounter (Signed)
I met with the patient when he was in the clinic today and assisted him with getting his medications and a new glucometer.  I contacted Lauro Regulus, Pharmacy Tech/[facility] and explained that the patient has no money for co-pay.  She said it would be fine, because he has Medicaid, he could still get his medications without having to pay the co-pay.  I explained this to the patient and he picked up the medications from the pharmacy when his appointment was completed.  He was also provided food from the [facility] food pantry.    I then poke to [place] and arranged for ride to take patient home after his appointment today.

## 2022-09-17 NOTE — Patient Instructions (Signed)

## 2022-11-01 ENCOUNTER — Telehealth: Payer: Self-pay

## 2022-11-01 NOTE — Progress Notes (Signed)
 Patient attempted to be outreached by Erasmo Leventhal, PharmD Candidate to discuss hypertension. Left voicemail for patient to return our call at their convenience at [phone]

## 2022-11-04 ENCOUNTER — Ambulatory Visit: Payer: Medicaid Other | Admitting: Family Medicine

## 2022-11-04 DIAGNOSIS — I10 Essential (primary) hypertension: Secondary | ICD-10-CM

## 2022-11-12 ENCOUNTER — Encounter: Payer: Self-pay | Admitting: Pharmacist

## 2022-11-18 ENCOUNTER — Ambulatory Visit: Payer: Medicaid Other | Admitting: Family Medicine

## 2022-11-22 ENCOUNTER — Other Ambulatory Visit: Payer: Self-pay

## 2022-11-22 ENCOUNTER — Other Ambulatory Visit: Payer: Self-pay | Admitting: Urology

## 2022-11-27 NOTE — Patient Instructions (Addendum)
SURGICAL WAITING ROOM VISITATION  Patients having surgery or a procedure may have no more than 2 support people in the waiting area - these visitors may rotate.    Children under the age of 34 must have an adult with them who is not the patient.  Due to an increase in RSV and influenza rates and associated hospitalizations, children ages 32 and under may not visit patients in [facility] hospitals.  If the patient needs to stay at the hospital during part of their recovery, the visitor guidelines for inpatient rooms apply. Pre-op nurse will coordinate an appropriate time for 1 support person to accompany patient in pre-op.  This support person may not rotate.    Please refer to the [facility] website for the visitor guidelines for Inpatients (after your surgery is over and you are in a regular room).    Your procedure is scheduled on: 12/05/22    Report to [facility] Main Entrance    Report to admitting at 10:15 AM   Call this number if you have problems the morning of surgery [phone]   Do not eat food or drink liquids :After Midnight.          If you have questions, please contact your surgeon's office.    Oral Hygiene is also important to reduce your risk of infection.                                    Remember - BRUSH YOUR TEETH THE MORNING OF SURGERY WITH YOUR REGULAR TOOTHPASTE  DENTURES WILL BE REMOVED PRIOR TO SURGERY PLEASE DO NOT APPLY "Poly grip" OR ADHESIVES!!!   Do NOT smoke after Midnight   Stop all vitamins and herbal supplements 7 days before surgery.   Take these medicines the morning of surgery with A SIP OF WATER:  Amlodipine, Atorvastatin, Cymbalta, Pepcid, Flomax  DO NOT TAKE ANY ORAL DIABETIC MEDICATIONS DAY OF YOUR SURGERY  How to Manage Your Diabetes Before and After Surgery  Why is it important to control my blood sugar before and after surgery? Improving blood sugar levels before and after surgery helps healing and can limit  problems. A way of improving blood sugar control is eating a healthy diet by:  Eating less sugar and carbohydrates  Increasing activity/exercise  Talking with your doctor about reaching your blood sugar goals High blood sugars (greater than 180 mg/dL) can raise your risk of infections and slow your recovery, so you will need to focus on controlling your diabetes during the weeks before surgery. Make sure that the doctor who takes care of your diabetes knows about your planned surgery including the date and location.  How do I manage my blood sugar before surgery? Check your blood sugar at least 4 times a day, starting 2 days before surgery, to make sure that the level is not too high or low. Check your blood sugar the morning of your surgery when you wake up and every 2 hours until you get to the Short Stay unit. If your blood sugar is less than 70 mg/dL, you will need to treat for low blood sugar: Do not take insulin. Treat a low blood sugar (less than 70 mg/dL) with  cup of clear juice (cranberry or apple), 4 glucose tablets, OR glucose gel. Recheck blood sugar in 15 minutes after treatment (to make sure it is greater than 70 mg/dL). If your blood sugar  is not greater than 70 mg/dL on recheck, call [phone] for further instructions. Report your blood sugar to the short stay nurse when you get to Short Stay.  If you are admitted to the hospital after surgery: Your blood sugar will be checked by the staff and you will probably be given insulin after surgery (instead of oral diabetes medicines) to make sure you have good blood sugar levels. The goal for blood sugar control after surgery is 80-180 mg/dL.   WHAT DO I DO ABOUT MY DIABETES MEDICATION?  Do not take oral diabetes medicines (pills) the morning of surgery.  THE DAY BEFORE SURGERY, take Novolog before meals as prescribed. Take 50% of Lantus. Take Metformin as prescribed. Take morning dose of Glipizide as prescribed.   THE  MORNING OF SURGERY, Do not take Metformin or Glipizide. Do not take Novolog unless blood sugar is greater than 220, then take 50% of dose.  Reviewed and Endorsed by [facility] Patient Education Committee, August 2015                              You may not have any metal on your body including jewelry, and body piercing             Do not wear lotions, powders, cologne, or deodorant              Men may shave face and neck.   Do not bring valuables to the hospital. [facility] IS NOT             RESPONSIBLE   FOR VALUABLES.   Contacts, glasses, dentures or bridgework may not be worn into surgery.  DO NOT BRING YOUR HOME MEDICATIONS TO THE HOSPITAL. PHARMACY WILL DISPENSE MEDICATIONS LISTED ON YOUR MEDICATION LIST TO YOU DURING YOUR ADMISSION IN THE HOSPITAL!    Patients discharged on the day of surgery will not be allowed to drive home.  Someone NEEDS to stay with you for the first 24 hours after anesthesia.              Please read over the following fact sheets you were given: IF YOU HAVE QUESTIONS ABOUT YOUR PRE-OP INSTRUCTIONS PLEASE CALL [phone] [facility]ras    If you received a COVID test during your pre-op visit  it is requested that you wear a mask when out in public, stay away from anyone that may not be feeling well and notify your surgeon if you develop symptoms. If you test positive for Covid or have been in contact with anyone that has tested positive in the last 10 days please notify you surgeon.    [facility] - Preparing for Surgery Before surgery, you can play an important role.  Because skin is not sterile, your skin needs to be as free of germs as possible.  You can reduce the number of germs on your skin by washing with CHG (chlorahexidine gluconate) soap before surgery.  CHG is an antiseptic cleaner which kills germs and bonds with the skin to continue killing germs even after washing. Please DO NOT use if you have an allergy to CHG or antibacterial soaps.  If your  skin becomes reddened/irritated stop using the CHG and inform your nurse when you arrive at Short Stay. Do not shave (including legs and underarms) for at least 48 hours prior to the first CHG shower.  You may shave your face/neck. Please follow these instructions carefully:  1.  Shower with CHG Soap the night before surgery and the  morning of Surgery.  2.  If you choose to wash your hair, wash your hair first as usual with your  normal  shampoo.  3.  After you shampoo, rinse your hair and body thoroughly to remove the  shampoo.                           4.  Use CHG as you would any other liquid soap.  You can apply chg directly  to the skin and wash                       Gently with a scrungie or clean washcloth.  5.  Apply the CHG Soap to your body ONLY FROM THE NECK DOWN.   Do not use on face/ open                           Wound or open sores. Avoid contact with eyes, ears mouth and genitals (private parts).                       Wash face,  Genitals (private parts) with your normal soap.             6.  Wash thoroughly, paying special attention to the area where your surgery  will be performed.  7.  Thoroughly rinse your body with warm water from the neck down.  8.  DO NOT shower/wash with your normal soap after using and rinsing off  the CHG Soap.                9.  Pat yourself dry with a clean towel.            10.  Wear clean pajamas.            11.  Place clean sheets on your bed the night of your first shower and do not  sleep with pets. Day of Surgery : Do not apply any lotions/deodorants the morning of surgery.  Please wear clean clothes to the hospital/surgery center.  FAILURE TO FOLLOW THESE INSTRUCTIONS MAY RESULT IN THE CANCELLATION OF YOUR SURGERY PATIENT SIGNATURE_________________________________  NURSE SIGNATURE__________________________________  ________________________________________________________________________

## 2022-11-27 NOTE — Progress Notes (Signed)
Anesthesia Review:  PCP: [name] LOV 09/17/22  Cardiologist : Chest x-ray : 06/23/22- 1 view  EKG : 06/19/2022  Echo : Stress test: Cardiac Cath :  Activity level:  Sleep Study/ CPAP : Fasting Blood Sugar :      / Checks Blood Sugar -- times a day:   Blood Thinner/ Instructions /Last Dose: ASA / Instructions/ Last Dose :    DM- type 2  Metformin- Glucotrol-  Novolog with meals Lantus

## 2022-11-28 NOTE — Progress Notes (Addendum)
Patient is not sure of what medications he takes or doses. Gave him pharmacy number to call once home with med list.  COVID Vaccine Completed: yes  Date of COVID positive in last 90 days: no  PCP - [facility] Cardiologist - Noralyn Pick, PA  Chest x-ray - 06/23/22 Epic EKG - 06/19/22 Epic  Stress Test - n/a ECHO - 05/16/17 Epic Cardiac Cath - n/a Pacemaker/ICD device last checked: n/a Spinal Cord Stimulator: n/a  Bowel Prep - no  Sleep Study - n/a CPAP -   Fasting Blood Sugar - 200s Checks Blood Sugar 1 times a day  Last dose of GLP1 agonist-  N/A GLP1 instructions:  N/A   Last dose of SGLT-2 inhibitors-  N/A SGLT-2 instructions: N/A   Blood Thinner Instructions:   Aspirin Instructions: ASA 81 Last Dose:  Activity level: Can go up a flight of stairs and perform activities of daily living without stopping and without symptoms of chest pain or shortness of breath.    Anesthesia review: uncontrolled DM, HTN, A1C 12.4  Patient denies shortness of breath, fever, cough and chest pain at PAT appointment  Patient verbalized understanding of instructions that were given to them at the PAT appointment. Patient was also instructed that they will need to review over the PAT instructions again at home before surgery.

## 2022-12-02 ENCOUNTER — Encounter (HOSPITAL_COMMUNITY)
Admission: RE | Admit: 2022-12-02 | Discharge: 2022-12-02 | Disposition: A | Discharge status: Home / Self Care | Payer: Medicaid Other | Source: Ambulatory Visit | Attending: Urology | Admitting: Urology

## 2022-12-02 ENCOUNTER — Other Ambulatory Visit: Payer: Self-pay

## 2022-12-02 ENCOUNTER — Encounter (HOSPITAL_COMMUNITY): Payer: Self-pay

## 2022-12-02 VITALS — BP 118/84 | HR 87 | Temp 98.4°F | Resp 16 | Ht 68.0 in | Wt 152.0 lb

## 2022-12-02 DIAGNOSIS — Z01812 Encounter for preprocedural laboratory examination: Secondary | ICD-10-CM | POA: Diagnosis not present

## 2022-12-02 DIAGNOSIS — Z01818 Encounter for other preprocedural examination: Secondary | ICD-10-CM | POA: Diagnosis present

## 2022-12-02 DIAGNOSIS — E1165 Type 2 diabetes mellitus with hyperglycemia: Secondary | ICD-10-CM | POA: Diagnosis not present

## 2022-12-02 LAB — BASIC METABOLIC PANEL
Anion gap: 9 (ref 5–15)
BUN: 26 mg/dL — ABNORMAL HIGH (ref 8–23)
CO2: 23 mmol/L (ref 22–32)
Calcium: 9.4 mg/dL (ref 8.9–10.3)
Chloride: 106 mmol/L (ref 98–111)
Creatinine, Ser: 1.19 mg/dL (ref 0.61–1.24)
GFR, Estimated: >60 mL/min (ref >60)
Glucose, Bld: 211 mg/dL — ABNORMAL HIGH (ref 70–99)
Potassium: 4.1 mmol/L (ref 3.5–5.1)
Sodium: 138 mmol/L (ref 135–145)

## 2022-12-02 LAB — HEMOGLOBIN A1C
Hgb A1c MFr Bld: 12.4 % — ABNORMAL HIGH (ref 4.8–5.6)
Mean Plasma Glucose: 309.18 mg/dL

## 2022-12-02 LAB — CBC
HCT: 42.8 % (ref 39.0–52.0)
Hemoglobin: 13.5 g/dL (ref 13.0–17.0)
MCH: 29.8 pg (ref 26.0–34.0)
MCHC: 31.5 g/dL (ref 30.0–36.0)
MCV: 94.5 fL (ref 80.0–100.0)
Platelets: 351 10*3/uL (ref 150–400)
RBC: 4.53 MIL/uL (ref 4.22–5.81)
RDW: 13.7 % (ref 11.5–15.5)
WBC: 5.8 10*3/uL (ref 4.0–10.5)
nRBC: 0 % (ref 0.0–0.2)

## 2022-12-02 LAB — GLUCOSE, CAPILLARY: Glucose-Capillary: 226 mg/dL — ABNORMAL HIGH (ref 70–99)

## 2022-12-02 NOTE — Progress Notes (Signed)
A1C 12.4 results routed to Dr. Jennette Bill

## 2022-12-02 NOTE — H&P (Incomplete)
CC/HPI: [name] is a 62 year old male who was initially seen by [place] in 2022 for consult for right ureteral stones he had a stent placed at that time. It is unclear if he ever had treatment for the stones. CT in June 2023 demonstrates persistent right ureteral stent.   Patient did have an MRI in 2022 which demonstrated a left renal mass around 3 cm in size on the anterior kidney proximal to the hilum. CT of the chest in 2023 demonstrates no masses.   Of note the patient has significant medical comorbidities including crack cocaine abuse diabetes most recent A1c of 14 hypertension hyperlipidemia.   This is the [facility]'s [facility] after several missed visits. He continues have lower urinary tract symptoms but has no gross hematuria. We discussed his 2 problems at length including the mass on his left side as well as a retained right ureteral stent. 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   Review of the CT done today demonstrates a 3.6 cm mass on the anterior left kidney that is perihilar abutting the left renal vein the previous measurement of his mass 2 years ago was 3.2 mm.   Patient denies any history of MI DVT PE or CVA.  Patient denies any history of COPD asthma obstructive sleep apnea.  Patient denies blood thinners  Patient is currently an everyday smoker   CT 08/2021:  IMPRESSION:  1. Nonobstructive calculi are noted in the collecting systems of  both kidneys measuring up to 4 mm in the lower pole collecting  system of the right kidney.  2. No other acute findings are noted to account for the patient's  symptoms.  3. Right-sided double-J ureteral stent appears appropriately  located. Persistent mild fullness in the right renal pelvis is  unchanged.  4. Aortic atherosclerosis.  5.  Additional incidental findings, as above.   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 No involvement of the left renal collecting system or left  renal vein. No adjacent pathologic adenopathy.     ALLERGIES: Lisinopril    MEDICATIONS: Metformin Hcl 1,000 mg tablet  Amlodipine Besylate 5 mg tablet  Atorvastatin Calcium 20 mg tablet  Glipizide 5 mg tablet  Novolin R Flexpen 100 unit/ml (3 ml) insulin pen     GU PSH: None   NON-GU PSH: None   GU PMH: None   NON-GU PMH: Arthritis Depression Hypercholesterolemia Hypertension    FAMILY HISTORY: None   SOCIAL HISTORY: Marital Status: Single Race: Black or African American Current Smoking Status: Patient smokes.   Tobacco Use Assessment Completed: Used Tobacco in last 30 days? Has never drank.  Drinks 1 caffeinated drink per day.    REVIEW OF SYSTEMS:    GU Review Male:   Patient reports frequent urination, hard to postpone urination, burning/ pain with urination, get up at night to urinate, leakage of urine, stream starts and stops, trouble starting your stream, have to strain to urinate , erection problems, and penile pain.   Gastrointestinal (Upper):   Patient denies nausea, vomiting, and indigestion/ heartburn.  Gastrointestinal (Lower):   Patient denies diarrhea and constipation.  Constitutional:   Patient reports weight loss. Patient denies  fever, night sweats, and fatigue.  Skin:   Patient denies skin rash/ lesion and itching.  Eyes:   Patient denies blurred vision and double vision.  Ears/ Nose/ Throat:   Patient denies sinus problems and sore throat.  Hematologic/Lymphatic:   Patient denies swollen glands and easy bruising.  Cardiovascular:   Patient denies leg swelling and chest pains.  Respiratory:   Patient denies cough and shortness of breath.  Endocrine:   Patient denies  excessive thirst.  Musculoskeletal:   Patient reports back pain. Patient denies joint pain.  Neurological:   Patient denies headaches and dizziness.  Psychologic:   Patient reports depression. Patient denies anxiety.   VITAL SIGNS:      11/22/2022 11:03 AM  Weight 160 lb / 72.57 kg  Height 68 in / 172.72 cm  BP 123/80 mmHg  Pulse 88 /min  Temperature 98.0 F / 36.6 C  BMI 24.3 kg/m   MULTI-SYSTEM PHYSICAL EXAMINATION:    Constitutional: Well-nourished. No physical deformities. Normally developed. Good grooming.  Neck: Neck symmetrical, not swollen. Normal tracheal position.  Respiratory: No labored breathing, no use of accessory muscles.   Cardiovascular: Normal temperature, normal extremity pulses, no swelling, no varicosities.  Lymphatic: No enlargement of neck, axillae, groin.  Skin: No paleness, no jaundice, no cyanosis. No lesion, no ulcer, no rash.  Neurologic / Psychiatric: Oriented to time, oriented to place, oriented to person. No depression, no anxiety, no agitation.  Gastrointestinal: No mass, no tenderness, no rigidity, non obese abdomen, peiumbilical scar noted, no inguinal scars or other abdominal scars. abdomen soft non tender. No CVA tenderness  Eyes: Normal conjunctivae. Normal eyelids.  Ears, Nose, Mouth, and Throat: Left ear no scars, no lesions, no masses. Right ear no scars, no lesions, no masses. Nose no scars, no lesions, no masses. Normal hearing. Normal lips.  Musculoskeletal: Normal gait and station of head and neck.     Complexity of Data:  Lab Test Review:   Creatinine  Records Review:   Previous Doctor Records, Previous Hospital Records  Urine Test Review:   Urinalysis  Urodynamics Review:   Review Bladder Scan  X-Ray Review: C.T. Chest/ Abd/Pelvis: Reviewed Films.     PROCEDURES:         C.T. Chest-Abd-Pelvis w & w/o - [id number], [id number]      Patient confirmed No Neulasta OnPro Device.          PVR Ultrasound - [id number]  Scanned Volume: 76 cc          Omnipaque 300 - [id number] OMNIPAQUE 300 (No Wastage)             Urinalysis w/Scope Dipstick Dipstick Cont'd Micro  Color: Yellow Bilirubin: Neg mg/dL WBC/hpf: >44/IHK  Appearance: Cloudy Ketones: Neg mg/dL RBC/hpf: 3 - 74/QVZ  Specific Gravity: 1.020 Blood: 3+ ery/uL Bacteria: Few (10-25/hpf)  pH: 5.5 Protein: 3+ mg/dL Cystals: NS (Not Seen)  Glucose: Neg mg/dL Urobilinogen: 0.2 mg/dL Casts: Hyaline    Nitrites: Neg Trichomonas: Not Present    Leukocyte Esterase: 3+ leu/uL Mucous: Present      Epithelial Cells: 0 - 5/hpf      Yeast: Many (>10/hpf)      Sperm: Not Present    Notes: QNS for spun micro.    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 Chest/ Abd/Pelvis With and Without I.V. Contrast - please complete after creatinine comes back   X-Ray Notes: History:  Hematuria: Yes/No  Patient to see MD after exam: Yes/No  Previous exam: CT / IVP/ US/ KUB/ None  When:  Where:  Diabetic: Yes/ No  BUN/ Creatinine:  Date of last BUN Creatinine:  Weight in pounds:  Allergy- IV Contrast: Yes/ No  Conflicting diabetic meds: Yes/ No  Diabetic Meds:  Prior Authorization #: Medicaid ([facility]) Berkley Harvey #[id number] Valid 11/22/22 thru 03/11/23            Schedule         Document Letter(s):  Created for Patient: Clinical Summary         Notes:   3.6 cm left renal mass on the anterior of the kidney abutting the hilum. Mass shows minimal interval growth since MRI in 2022. Will complete renal mass workup including follow-up read for chest abdomen pelvis, follow-up labs. Due to the patient's history of severe diabetes as well as tumor location proximal to the renal hilum will discuss with other providers regarding possible partial nephrectomy and  timing.   Patient with a right sided retained stent CT demonstrates stent is in place with minimal calcification will plan to take patient to the OR next available for right ureteroscopy and stent removal urine culture has been ordered today.   We discussed risk benefits alternatives to the procedure including bleeding infection damage to surrounding structures including the ureter and urethra possible inability to remove all the stone possible need for nephrostomy tube patient voiced understanding and wanted to proceed with the procedure.   Plan for patient to go to OR today for right ureteroscopy with laser lithotripsy and stent placement

## 2022-12-05 ENCOUNTER — Other Ambulatory Visit: Payer: Self-pay

## 2022-12-05 ENCOUNTER — Ambulatory Visit (HOSPITAL_COMMUNITY): Payer: Medicaid Other

## 2022-12-05 ENCOUNTER — Encounter (HOSPITAL_COMMUNITY): Payer: Self-pay | Admitting: Urology

## 2022-12-05 ENCOUNTER — Ambulatory Visit (HOSPITAL_COMMUNITY)
Admission: RE | Admit: 2022-12-05 | Discharge: 2022-12-05 | Disposition: A | Discharge status: Home / Self Care | Payer: Medicaid Other | Attending: Urology | Admitting: Urology

## 2022-12-05 ENCOUNTER — Ambulatory Visit (HOSPITAL_BASED_OUTPATIENT_CLINIC_OR_DEPARTMENT_OTHER): Payer: Medicaid Other | Admitting: Certified Registered"

## 2022-12-05 ENCOUNTER — Ambulatory Visit (HOSPITAL_COMMUNITY): Payer: Self-pay | Admitting: Physician Assistant

## 2022-12-05 ENCOUNTER — Encounter (HOSPITAL_COMMUNITY)
Admission: RE | Disposition: A | Discharge status: Home / Self Care | Payer: Self-pay | Source: Home / Self Care | Attending: Urology

## 2022-12-05 DIAGNOSIS — E119 Type 2 diabetes mellitus without complications: Secondary | ICD-10-CM | POA: Diagnosis not present

## 2022-12-05 DIAGNOSIS — E785 Hyperlipidemia, unspecified: Secondary | ICD-10-CM | POA: Diagnosis not present

## 2022-12-05 DIAGNOSIS — I10 Essential (primary) hypertension: Secondary | ICD-10-CM | POA: Insufficient documentation

## 2022-12-05 DIAGNOSIS — Z79899 Other long term (current) drug therapy: Secondary | ICD-10-CM | POA: Diagnosis not present

## 2022-12-05 DIAGNOSIS — F1410 Cocaine abuse, uncomplicated: Secondary | ICD-10-CM | POA: Diagnosis not present

## 2022-12-05 DIAGNOSIS — I700 Atherosclerosis of aorta: Secondary | ICD-10-CM | POA: Diagnosis not present

## 2022-12-05 DIAGNOSIS — Z794 Long term (current) use of insulin: Secondary | ICD-10-CM | POA: Insufficient documentation

## 2022-12-05 DIAGNOSIS — N201 Calculus of ureter: Secondary | ICD-10-CM | POA: Insufficient documentation

## 2022-12-05 DIAGNOSIS — Z7984 Long term (current) use of oral hypoglycemic drugs: Secondary | ICD-10-CM | POA: Diagnosis not present

## 2022-12-05 DIAGNOSIS — F17200 Nicotine dependence, unspecified, uncomplicated: Secondary | ICD-10-CM | POA: Diagnosis not present

## 2022-12-05 DIAGNOSIS — E1165 Type 2 diabetes mellitus with hyperglycemia: Secondary | ICD-10-CM

## 2022-12-05 DIAGNOSIS — N39498 Other specified urinary incontinence: Secondary | ICD-10-CM

## 2022-12-05 HISTORY — PX: CYSTOSCOPY/URETEROSCOPY/HOLMIUM LASER/STENT PLACEMENT: SHX6546

## 2022-12-05 LAB — GLUCOSE, CAPILLARY
Glucose-Capillary: 116 mg/dL — ABNORMAL HIGH (ref 70–99)
Glucose-Capillary: 125 mg/dL — ABNORMAL HIGH (ref 70–99)

## 2022-12-05 SURGERY — CYSTOSCOPY/URETEROSCOPY/HOLMIUM LASER/STENT PLACEMENT
Anesthesia: General | Laterality: Right

## 2022-12-05 MED ORDER — OXYCODONE HCL 5 MG PO TABS: 5.0000 mg | ORAL_TABLET | Freq: Once | ORAL | Status: DC | PRN

## 2022-12-05 MED ORDER — PROPOFOL 10 MG/ML IV BOLUS
INTRAVENOUS | Status: AC
  Filled 2022-12-05: qty 20

## 2022-12-05 MED ORDER — FENTANYL CITRATE (PF) 100 MCG/2ML IJ SOLN
INTRAMUSCULAR | Status: DC | PRN
  Administered 2022-12-05: 50 ug via INTRAVENOUS

## 2022-12-05 MED ORDER — CHLORHEXIDINE GLUCONATE 0.12 % MT SOLN
15.0000 mL | Freq: Once | OROMUCOSAL | Status: AC
  Administered 2022-12-05: 15 mL via OROMUCOSAL

## 2022-12-05 MED ORDER — PROPOFOL 10 MG/ML IV BOLUS
INTRAVENOUS | Status: DC | PRN
Start: 2022-12-05 — End: 2022-12-05
  Administered 2022-12-05: 120 mg via INTRAVENOUS

## 2022-12-05 MED ORDER — ONDANSETRON HCL 4 MG/2ML IJ SOLN
INTRAMUSCULAR | Status: DC | PRN
  Administered 2022-12-05: 4 mg via INTRAVENOUS

## 2022-12-05 MED ORDER — MIDAZOLAM HCL 2 MG/2ML IJ SOLN
INTRAMUSCULAR | Status: AC
  Filled 2022-12-05: qty 2

## 2022-12-05 MED ORDER — LACTATED RINGERS IV SOLN: INTRAVENOUS | Status: DC

## 2022-12-05 MED ORDER — EPHEDRINE 5 MG/ML INJ
INTRAVENOUS | Status: AC
  Filled 2022-12-05: qty 5

## 2022-12-05 MED ORDER — 0.9 % SODIUM CHLORIDE (POUR BTL) OPTIME
TOPICAL | Status: DC | PRN
Start: 2022-12-05 — End: 2022-12-05
  Administered 2022-12-05: 1000 mL

## 2022-12-05 MED ORDER — CEFAZOLIN SODIUM-DEXTROSE 2-4 GM/100ML-% IV SOLN
2.0000 g | INTRAVENOUS | Status: AC
  Administered 2022-12-05: 2 g via INTRAVENOUS
  Filled 2022-12-05: qty 100

## 2022-12-05 MED ORDER — DEXAMETHASONE SODIUM PHOSPHATE 10 MG/ML IJ SOLN
INTRAMUSCULAR | Status: DC | PRN
  Administered 2022-12-05: 4 mg via INTRAVENOUS

## 2022-12-05 MED ORDER — ONDANSETRON HCL 4 MG/2ML IJ SOLN: 4.0000 mg | Freq: Once | INTRAMUSCULAR | Status: DC | PRN

## 2022-12-05 MED ORDER — INSULIN ASPART 100 UNIT/ML IJ SOLN: 0.0000 [IU] | INTRAMUSCULAR | Status: DC | PRN

## 2022-12-05 MED ORDER — MIDAZOLAM HCL 2 MG/2ML IJ SOLN
INTRAMUSCULAR | Status: DC | PRN
  Administered 2022-12-05: 2 mg via INTRAVENOUS

## 2022-12-05 MED ORDER — OXYCODONE HCL 5 MG/5ML PO SOLN: 5.0000 mg | Freq: Once | ORAL | Status: DC | PRN

## 2022-12-05 MED ORDER — FENTANYL CITRATE (PF) 100 MCG/2ML IJ SOLN
INTRAMUSCULAR | Status: AC
  Filled 2022-12-05: qty 2

## 2022-12-05 MED ORDER — DEXAMETHASONE SODIUM PHOSPHATE 10 MG/ML IJ SOLN
INTRAMUSCULAR | Status: AC
  Filled 2022-12-05: qty 1

## 2022-12-05 MED ORDER — LIDOCAINE 2% (20 MG/ML) 5 ML SYRINGE
INTRAMUSCULAR | Status: DC | PRN
  Administered 2022-12-05: 40 mg via INTRAVENOUS

## 2022-12-05 MED ORDER — TAMSULOSIN HCL 0.4 MG PO CAPS
0.4000 mg | ORAL_CAPSULE | Freq: Every day | ORAL | 8 refills | Status: DC
  Filled 2022-12-05: qty 30, 30d supply, fill #0

## 2022-12-05 MED ORDER — HYOSCYAMINE SULFATE 0.125 MG PO TBDP
0.1250 mg | ORAL_TABLET | Freq: Four times a day (QID) | ORAL | 0 refills | Status: DC | PRN
Start: 2022-12-05 — End: 2023-05-28
  Filled 2022-12-05: qty 10, 3d supply, fill #0

## 2022-12-05 MED ORDER — CEPHALEXIN 500 MG PO CAPS
500.0000 mg | ORAL_CAPSULE | Freq: Once | ORAL | 0 refills | Status: AC
  Filled 2022-12-05: qty 1, 1d supply, fill #0

## 2022-12-05 MED ORDER — FENTANYL CITRATE PF 50 MCG/ML IJ SOSY: 25.0000 ug | PREFILLED_SYRINGE | INTRAMUSCULAR | Status: DC | PRN

## 2022-12-05 MED ORDER — ONDANSETRON HCL 4 MG/2ML IJ SOLN
INTRAMUSCULAR | Status: AC
  Filled 2022-12-05: qty 2

## 2022-12-05 MED ORDER — EPHEDRINE SULFATE-NACL 50-0.9 MG/10ML-% IV SOSY
PREFILLED_SYRINGE | INTRAVENOUS | Status: DC | PRN
  Administered 2022-12-05: 5 mg via INTRAVENOUS
  Administered 2022-12-05: 10 mg via INTRAVENOUS
  Administered 2022-12-05: 5 mg via INTRAVENOUS

## 2022-12-05 MED ORDER — SODIUM CHLORIDE 0.9 % IR SOLN
Status: DC | PRN
  Administered 2022-12-05: 3000 mL via INTRAVESICAL

## 2022-12-05 MED ORDER — ORAL CARE MOUTH RINSE: 15.0000 mL | Freq: Once | OROMUCOSAL | Status: AC

## 2022-12-05 SURGICAL SUPPLY — 27 items
APL SKNCLS STERI-STRIP NONHPOA (GAUZE/BANDAGES/DRESSINGS) ×1
BAG COUNTER SPONGE SURGICOUNT (BAG) IMPLANT
BAG SPNG CNTER NS LX DISP (BAG)
BAG URO CATCHER STRL LF (MISCELLANEOUS) ×1 IMPLANT
BASKET ZERO TIP NITINOL 2.4FR (BASKET) IMPLANT
BENZOIN TINCTURE PRP APPL 2/3 (GAUZE/BANDAGES/DRESSINGS) IMPLANT
BSKT STON RTRVL ZERO TP 2.4FR (BASKET)
CATH URETL OPEN END 6FR 70 (CATHETERS) IMPLANT
CLOTH BEACON ORANGE TIMEOUT ST (SAFETY) ×1 IMPLANT
GLOVE SURG LX STRL 7.5 STRW (GLOVE) ×1 IMPLANT
GOWN STRL REUS W/ TWL XL LVL3 (GOWN DISPOSABLE) ×1 IMPLANT
GOWN STRL REUS W/TWL XL LVL3 (GOWN DISPOSABLE) ×1
GUIDEWIRE STR DUAL SENSOR (WIRE) ×1 IMPLANT
GUIDEWIRE ZIPWRE .038 STRAIGHT (WIRE) IMPLANT
IV NS 1000ML (IV SOLUTION)
IV NS 1000ML BAXH (IV SOLUTION) ×1 IMPLANT
KIT TURNOVER KIT A (KITS) IMPLANT
LASER FIB FLEXIVA PULSE ID 365 (Laser) IMPLANT
MANIFOLD NEPTUNE II (INSTRUMENTS) ×1 IMPLANT
PACK CYSTO (CUSTOM PROCEDURE TRAY) ×1 IMPLANT
SHEATH NAVIGATOR HD 12/14X36 (SHEATH) IMPLANT
STENT URET 6FRX24 CONTOUR (STENTS) IMPLANT
STRIP CLOSURE SKIN 1/2X4 (GAUZE/BANDAGES/DRESSINGS) IMPLANT
TRACTIP FLEXIVA PULS ID 200XHI (Laser) IMPLANT
TRACTIP FLEXIVA PULSE ID 200 (Laser)
TUBING CONNECTING 10 (TUBING) ×1 IMPLANT
TUBING UROLOGY SET (TUBING) ×1 IMPLANT

## 2022-12-05 NOTE — Op Note (Signed)
Preoperative diagnosis: right retained ureteral stent and calculus  Postoperative diagnosis: right retained ureteral stent   Procedure:   Cystoscopy right ureteroscopy Right retained ureteral stent removal right 60F x 24 ureteral stent placement with strings   Surgeon: Dr. Vilma Prader  Anesthesia: General  Complications: None  Intraoperative findings: cystoscopy shows no bladder tumor masses, Retained Right Ureteral stent removed without complication, ureteroscopy demonstrated no residual stone.   EBL: Minimal  Specimens: right ureteral stent    Indication: David Wyatt is a 62 y.o.   patient with a cane right ureteral stent after being stented for obstructing stone in 2022. After reviewing the management options for treatment, the patient elected to proceed with the above surgical procedure(s). We have discussed the potential benefits and risks of the procedure, side effects of the proposed treatment, the likelihood of the patient achieving the goals of the procedure, and any potential problems that might occur during the procedure or recuperation. Informed consent has been obtained.   Description of procedure:  The patient was taken to the operating room and general anesthesia was induced.  The patient was placed in the dorsal lithotomy position, prepped and draped in the usual sterile fashion, and preoperative antibiotics were administered. A preoperative time-out was performed.   Cystourethroscopy was performed.  The patient's urethra was examined and was normal.  Urethra demonstrated mild bilobar prostatic hypertrophy, minimal median lobe. Pan cystoscopy was performed and the bladder systematically examined in its entirety. There was no evidence for any bladder tumors, stones, or other mucosal pathology.    Attention then turned to the right ureteral orifice the stent was not able protruding from the right ureteral orifice.  A 0.38 sensor wire was then advanced adjacent to  the stent proper placement was confirmed in the kidney with fluoroscopy.  The stent was then grasped with a grasper and removed without complication.  Next a 6 [place] long semirigid ureteroscope was then used to advance into the right ureter without complications ureteroscopy demonstrated no residual stones within the ureter.  Once the ureteroscope advanced to the renal pelvis a second sensor wire was placed into the renal pelvis.  The semirigid ureteroscope was then removed leaving the 2 sensor wires in place.  Then a flexible ureteroscope was advanced over the wires pan pyeloscopy was performed and the renal pelvis no residual stones were found.  The flexible ureteroscope was removed from the ureter demonstrating no injuries or residual stones in the ureter.   Reinspection of the ureter revealed no remaining visible stones or fragments.   The wire was then backloaded through the cystoscope and a ureteral stent was advance over the wire using Seldinger technique.  The stent was positioned appropriately under fluoroscopic and cystoscopic guidance.  The wire was then removed with an adequate stent curl noted in the renal pelvis as well as in the bladder.  The bladder was then emptied and the procedure ended.  The patient appeared to tolerate the procedure well and without complications.  The patient was able to be awakened and transferred to the recovery unit in satisfactory condition.   Disposition: The tether of the stent was left on and secured to the dorsal aspect of the patient's penis.  Instructions for removing the stent have been provided to the patient. The patient has been scheduled for followup in 1 week to discuss management of his Left renal mass.

## 2022-12-05 NOTE — Anesthesia Preprocedure Evaluation (Signed)
Anesthesia Evaluation  Patient identified by MRN, date of birth, ID band Patient awake    Reviewed: Allergy & Precautions, H&P , NPO status , Patient's Chart, lab work & pertinent test results  Airway Mallampati: II  TM Distance: >3 FB Neck ROM: Full    Dental no notable dental hx.    Pulmonary Current Smoker   Pulmonary exam normal breath sounds clear to auscultation       Cardiovascular hypertension, Pt. on medications Normal cardiovascular exam Rhythm:Regular Rate:Normal     Neuro/Psych negative neurological ROS  negative psych ROS   GI/Hepatic negative GI ROS,,,(+)     substance abuse  cocaine use  Endo/Other  diabetes, Poorly Controlled, Type 2, Insulin Dependent    Renal/GU negative Renal ROS  negative genitourinary   Musculoskeletal negative musculoskeletal ROS (+)    Abdominal   Peds negative pediatric ROS (+)  Hematology negative hematology ROS (+)   Anesthesia Other Findings   Reproductive/Obstetrics negative OB ROS                             Anesthesia Physical Anesthesia Plan  ASA: 4  Anesthesia Plan: General   Post-op Pain Management: Minimal or no pain anticipated   Induction: Intravenous  PONV Risk Score and Plan: Ondansetron, Dexamethasone and Treatment may vary due to age or medical condition  Airway Management Planned: LMA  Additional Equipment:   Intra-op Plan:   Post-operative Plan: Extubation in OR  Informed Consent: I have reviewed the patients History and Physical, chart, labs and discussed the procedure including the risks, benefits and alternatives for the proposed anesthesia with the patient or authorized representative who has indicated his/her understanding and acceptance.     Dental advisory given  Plan Discussed with: CRNA and Surgeon  Anesthesia Plan Comments: (Glucose of 700 in June 2024, continual crack cocaine use. Informed patient  he is at increased risk for MI and/or CVA)       Anesthesia Quick Evaluation

## 2022-12-05 NOTE — Discharge Instructions (Addendum)
DISCHARGE INSTRUCTIONS FOR KIDNEY STONE/URETERAL STENT   MEDICATIONS:  1.  Resume all your other meds from home - except do not take any extra narcotic pain meds that you may have at home.  2. Pyridium is to help with the burning/stinging when you urinate. 3. Please take tamsulosin while the stent in in place this will help relieve ureteral pain  4. Hyoscyamine can be used for bladder spasms   5. You can take tylenol and ibuprofen alternating every 4 hours for pain management   ACTIVITY:  1. No strenuous activity x 1week  2. No driving while on narcotic pain medications  3. Drink plenty of water  4. Continue to walk at home - it is normal to see blood in the urine while the stent is in place, so keep active, but don't over do it.  5. May return to work/ regular activity or when you feel ready  6. You may experience some pain when urinating in the kidney on the side that was operated on while the stent is in place this is normal  BATHING:  1. You can shower and we recommend daily showers  2. You have a string coming from your urethra: The stent string is attached to your ureteral stent. Do not pull on this.   SIGNS/SYMPTOMS TO CALL:  Please call us if you have a fever greater than 101.5, uncontrolled nausea/vomiting, uncontrolled pain, dizziness, unable to urinate, bloody urine with clots greater than the size of a quarter, chest pain, shortness of breath, leg swelling, leg pain, redness around wound, drainage from wound, or any other concerns or questions.   You can reach [place] at [phone].   FOLLOW-UP:  1. You have an appointment in 1 weeks with a ultrasound of your kidneys prior on 10/2.   2. You have a string attached to your stent, you may remove it on 12/09/22.  To do this, stand in the shower (you will leak some urine when removing the stent), grabs the strings coming out of the urethra and pull the strings in a consistent motion at medium speed until the stents are completely  removed. You may feel an odd sensation in your back while this is occurring.

## 2022-12-05 NOTE — Anesthesia Postprocedure Evaluation (Signed)
Anesthesia Post Note  Patient: David Wyatt  Procedure(s) Performed: CYSTOSCOPY, RIGHT URETEROSCOPY, AND RIGHT URETERAL STENT PLACEMENT (Right)     Patient location during evaluation: PACU Anesthesia Type: General Level of consciousness: awake and alert Pain management: pain level controlled Vital Signs Assessment: post-procedure vital signs reviewed and stable Respiratory status: spontaneous breathing, nonlabored ventilation and respiratory function stable Cardiovascular status: blood pressure returned to baseline and stable Postop Assessment: no apparent nausea or vomiting Anesthetic complications: no  No notable events documented.  Last Vitals:  Vitals:   12/05/22 1315 12/05/22 1330  BP: (!) 143/97 (!) 153/77  Pulse: 76 62  Resp: 14 14  Temp:  36.8 C  SpO2: 97% 96%    Last Pain:  Vitals:   12/05/22 1330  TempSrc:   PainSc: 0-No pain                 Bessye Stith,W. EDMOND

## 2022-12-05 NOTE — Progress Notes (Signed)
Recovery care and phase 2 care complete. Pt out of phase 2, awaiting ride, in no acute distress. All belongings in possession of pt, pt has no further questions about discharge paperwork. [facility] is on the way to get pt, he is only awaiting their arrival to leave hospital. Drink and snacks provided to pt. Charge RN KP aware pt is just awaiting ride and assumes care of pt for my lunch relief and will wheel out and discharge when they arrive to get him.

## 2022-12-05 NOTE — Anesthesia Procedure Notes (Signed)
Procedure Name: LMA Insertion Date/Time: 12/05/2022 12:05 PM  Performed by: Sindy Guadeloupe, CRNAPre-anesthesia Checklist: Patient identified, Emergency Drugs available, Suction available, Patient being monitored and Timeout performed Patient Re-evaluated:Patient Re-evaluated prior to induction Oxygen Delivery Method: Circle system utilized Preoxygenation: Pre-oxygenation with 100% oxygen Induction Type: IV induction Ventilation: Mask ventilation without difficulty LMA: LMA inserted LMA Size: 4.0 Number of attempts: 1 Tube secured with: Tape Dental Injury: Teeth and Oropharynx as per pre-operative assessment

## 2022-12-05 NOTE — Transfer of Care (Signed)
Immediate Anesthesia Transfer of Care Note  Patient: David Wyatt  Procedure(s) Performed: CYSTOSCOPY, RIGHT URETEROSCOPY, AND RIGHT URETERAL STENT PLACEMENT (Right)  Patient Location: PACU  Anesthesia Type:General  Level of Consciousness: drowsy and patient cooperative  Airway & Oxygen Therapy: Patient Spontanous Breathing and Patient connected to face mask oxygen  Post-op Assessment: Report given to RN and Post -op Vital signs reviewed and stable  Post vital signs: Reviewed and stable  Last Vitals:  Vitals Value Taken Time  BP 123/80 12/05/22 1247  Temp    Pulse 55 12/05/22 1248  Resp 13 12/05/22 1248  SpO2 100 % 12/05/22 1248  Vitals shown include unfiled device data.  Last Pain:  Vitals:   12/05/22 1045  TempSrc: Oral  PainSc: 0-No pain         Complications: No notable events documented.

## 2022-12-06 ENCOUNTER — Encounter (HOSPITAL_COMMUNITY): Payer: Self-pay | Admitting: Urology

## 2022-12-18 ENCOUNTER — Other Ambulatory Visit: Payer: Self-pay

## 2022-12-25 ENCOUNTER — Other Ambulatory Visit: Payer: Self-pay

## 2022-12-25 ENCOUNTER — Encounter (HOSPITAL_COMMUNITY): Payer: Self-pay

## 2022-12-25 ENCOUNTER — Emergency Department (HOSPITAL_COMMUNITY)
Admission: EM | Admit: 2022-12-25 | Discharge: 2022-12-25 | Disposition: A | Discharge status: Home / Self Care | Payer: Medicaid Other | Attending: Emergency Medicine | Admitting: Emergency Medicine

## 2022-12-25 DIAGNOSIS — I10 Essential (primary) hypertension: Secondary | ICD-10-CM | POA: Insufficient documentation

## 2022-12-25 DIAGNOSIS — Z794 Long term (current) use of insulin: Secondary | ICD-10-CM | POA: Insufficient documentation

## 2022-12-25 DIAGNOSIS — E119 Type 2 diabetes mellitus without complications: Secondary | ICD-10-CM | POA: Insufficient documentation

## 2022-12-25 DIAGNOSIS — M542 Cervicalgia: Secondary | ICD-10-CM | POA: Diagnosis present

## 2022-12-25 DIAGNOSIS — Z7984 Long term (current) use of oral hypoglycemic drugs: Secondary | ICD-10-CM | POA: Insufficient documentation

## 2022-12-25 DIAGNOSIS — M62838 Other muscle spasm: Secondary | ICD-10-CM | POA: Insufficient documentation

## 2022-12-25 DIAGNOSIS — Z79899 Other long term (current) drug therapy: Secondary | ICD-10-CM | POA: Insufficient documentation

## 2022-12-25 LAB — CBG MONITORING, ED: Glucose-Capillary: 83 mg/dL (ref 70–99)

## 2022-12-25 MED ORDER — LIDOCAINE 5 % EX PTCH
1.0000 | MEDICATED_PATCH | CUTANEOUS | 0 refills | Status: DC
Start: 2022-12-25 — End: 2023-05-28
  Filled 2022-12-25: qty 30, 30d supply, fill #0

## 2022-12-25 MED ORDER — ACETAMINOPHEN 500 MG PO TABS
1000.0000 mg | ORAL_TABLET | Freq: Once | ORAL | Status: AC
  Administered 2022-12-25: 1000 mg via ORAL
  Filled 2022-12-25: qty 2

## 2022-12-25 MED ORDER — CYCLOBENZAPRINE HCL 10 MG PO TABS
10.0000 mg | ORAL_TABLET | Freq: Two times a day (BID) | ORAL | 0 refills | Status: DC | PRN
  Filled 2022-12-25: qty 20, 10d supply, fill #0

## 2022-12-25 MED ORDER — CYCLOBENZAPRINE HCL 10 MG PO TABS
10.0000 mg | ORAL_TABLET | Freq: Once | ORAL | Status: AC
  Administered 2022-12-25: 10 mg via ORAL
  Filled 2022-12-25: qty 1

## 2022-12-25 MED ORDER — LIDOCAINE 5 % EX PTCH
1.0000 | MEDICATED_PATCH | Freq: Once | CUTANEOUS | Status: DC
  Administered 2022-12-25: 1 via TRANSDERMAL
  Filled 2022-12-25: qty 1

## 2022-12-25 MED ORDER — KETOROLAC TROMETHAMINE 30 MG/ML IJ SOLN
15.0000 mg | Freq: Once | INTRAMUSCULAR | Status: AC
  Administered 2022-12-25: 15 mg via INTRAMUSCULAR
  Filled 2022-12-25: qty 1

## 2022-12-25 NOTE — ED Provider Notes (Signed)
[place] [facility]GENCY DE[facility]MENT AT [facility] Provider Note   CSN: [id number] Arrival date & time: 12/25/22  2841     History  Chief Complaint  Patient presents with   Neck Injury    David Wyatt is a 62 y.o. male.  Patient is a 62 year old male with a past medical history of hypertension and diabetes presenting to the emergency department with neck pain.  The patient states that he woke up from sleep 1 day about a week ago with right sided neck pain and states that his pain has been worsening since then.  He states that his neck now feels stiff and he is not able to turn his head to the right side.  He denies any trauma or falls, numbness or weakness.  He denies any previous neck injuries or surgeries.  He states he has been taking Tylenol with some relief but has not taken anything yet this morning.  The history is provided by the patient.  Neck Injury       Home Medications Prior to Admission medications   Medication Sig Start Date End Date Taking? Authorizing Provider  Accu-Chek Softclix Lancets lancets Use to check blood sugar three times daily. E11.65 09/17/22  Yes Hoy Register, MD  acetaminophen (TYLENOL) 325 MG tablet Take 650 mg by mouth every 6 (six) hours as needed for mild pain or moderate pain.   Yes [provider]  amLODipine (NORVASC) 10 MG tablet Take 1 tablet (10 mg total) by mouth daily. 07/25/22  Yes Georgian Co M, PA-C  atorvastatin (LIPITOR) 40 MG tablet Take 1 tablet (40 mg total) by mouth daily. 07/25/22  Yes Georgian Co M, PA-C  Blood Glucose Monitoring Suppl (ACCU-CHEK GUIDE) w/Device KIT Use to check blood sugar three times daily 09/17/22  Yes Newlin, Enobong, MD  cholecalciferol (VITAMIN D3) 25 MCG (1000 UNIT) tablet Take 1,000 Units by mouth daily.   Yes [provider]  cyclobenzaprine (FLEXERIL) 10 MG tablet Take 1 tablet (10 mg total) by mouth 2 (two) times daily as needed for muscle spasms. 12/25/22  Yes Theresia Lo,  [place] K, DO  Dulaglutide (TRULICITY) 1.5 MG/0.5ML SOPN Inject 1.5 mg into the skin once a week.   Yes [provider]  DULoxetine (CYMBALTA) 60 MG capsule Take 1 capsule (60 mg total) by mouth daily. 09/17/22  Yes Hoy Register, MD  glipiZIDE (GLUCOTROL XL) 10 MG 24 hr tablet Take 1 tablet (10 mg total) by mouth daily with breakfast. 07/25/22  Yes McClung, Angela M, PA-C  glucose blood (ACCU-CHEK GUIDE) test strip Use to check blood sugar three times daily. E11.65 09/17/22  Yes Hoy Register, MD  hyoscyamine (ANASPAZ) 0.125 MG TBDP disintergrating tablet Place 1 tablet (0.125 mg total) under the tongue every 6 (six) hours as needed for up to 10 doses for bladder spasms. 12/05/22  Yes Showalter, Scherrie Merritts, MD  insulin aspart (NOVOLOG) 100 UNIT/ML FlexPen Inject 0-9 Units into the skin 3 (three) times daily with meals. Patient taking differently: Inject 0-5 Units into the skin 3 (three) times daily with meals. 70-130=0 units 131-180=2 units 181-240=3 Units 241-300= 4 Units 301-400=5 Units Greater than 400 call  MD 09/17/22  Yes Hoy Register, MD  lidocaine (LIDODERM) 5 % Place 1 patch onto the skin daily. Remove & Discard patch within 12 hours or as directed by MD 12/25/22  Yes Elayne Snare K, DO  Menthol, Topical Analgesic, 4 % GEL Apply 1 application  topically every 2 (two) hours as needed (Apply to  painful muscle joints).   Yes [provider]  metFORMIN (GLUCOPHAGE) 1000 MG tablet Take 1 tablet (1,000 mg total) by mouth 2 (two) times daily with a meal. 07/25/22  Yes McClung, Angela M, PA-C  naproxen sodium (ALEVE) 220 MG tablet Take 220 mg by mouth 2 (two) times daily as needed (Pain).   Yes [provider]  nicotine polacrilex (NICORETTE) 4 MG gum Take 4 mg by mouth daily as needed for smoking cessation (between the gum and the cheek every 1-2 hours as needed for cigarette craving).   Yes [provider]  polyethylene glycol (MIRALAX / GLYCOLAX) 17 g  packet Take 17 g by mouth 2 (two) times daily as needed for moderate constipation or mild constipation.   Yes [provider]  tamsulosin (FLOMAX) 0.4 MG CAPS capsule Take 1 capsule (0.4 mg total) by mouth daily. 12/05/22  Yes Adonis Brook, MD  traZODone (DESYREL) 100 MG tablet Take 1 tablet (100 mg total) by mouth at bedtime as needed for sleep. Patient taking differently: Take 100 mg by mouth at bedtime. 07/25/22  Yes Georgian Co M, PA-C  famotidine (PEPCID) 10 MG tablet Take 1 tablet (10 mg total) by mouth 2 (two) times daily. Patient not taking: Reported on 12/03/2022 07/25/22   Anders Simmonds, PA-C      Allergies    Lisinopril    Review of Systems   Review of Systems  Physical Exam Updated Vital Signs BP 125/84 (BP Location: Right Arm)   Pulse 69   Temp 98.1 F (36.7 C) (Oral)   Resp 18   Ht 5\' 8"  (1.727 m)   Wt 72.6 kg   SpO2 99%   BMI 24.33 kg/m  Physical Exam Vitals and nursing note reviewed.  Constitutional:      General: He is not in acute distress.    Appearance: Normal appearance.  HENT:     Head: Normocephalic and atraumatic.     Nose: Nose normal.     Mouth/Throat:     Mouth: Mucous membranes are moist.     Pharynx: Oropharynx is clear.  Eyes:     Extraocular Movements: Extraocular movements intact.     Conjunctiva/sclera: Conjunctivae normal.  Neck:     Comments: No midline neck tenderness Right greater than left sided cervical paraspinal muscle tenderness with significant ropey tightness to the muscle on palpation Patient able to rotate his neck approximately 20 degrees to the right and 40 degrees to the left, neck flexion/extension intact  Cardiovascular:     Rate and Rhythm: Normal rate.  Pulmonary:     Effort: Pulmonary effort is normal.  Abdominal:     General: Abdomen is flat.  Musculoskeletal:        General: Normal range of motion.  Skin:    General: Skin is warm and dry.  Neurological:     General: No focal deficit  present.     Mental Status: He is alert and oriented to person, place, and time.     Sensory: No sensory deficit.     Motor: No weakness (5 out of 5 strength in bilateral grip strength, elbow flexion/extension and shoulder abduction).  Psychiatric:        Mood and Affect: Mood normal.        Behavior: Behavior normal.     ED Results / Procedures / Treatments   Labs (all labs ordered are listed, but only abnormal results are displayed) Labs Reviewed  CBG MONITORING, ED  EKG EKG Interpretation Date/Time:  Wednesday December 25 2022 07:19:02 EDT Ventricular Rate:  68 PR Interval:  153 QRS Duration:  71 QT Interval:  422 QTC Calculation: 449 R Axis:   10  Text Interpretation: Sinus rhythm ST elevation, consider inferior injury No significant change since last tracing Confirmed by Elayne Snare ([id number]) on 12/25/2022 7:24:28 AM  Radiology No results found.  Procedures Procedures    Medications Ordered in ED Medications  lidocaine (LIDODERM) 5 % 1-3 patch (1 patch Transdermal Patch Applied 12/25/22 0745)  acetaminophen (TYLENOL) tablet 1,000 mg (1,000 mg Oral Given 12/25/22 0740)  ketorolac (TORADOL) 30 MG/ML injection 15 mg (15 mg Intramuscular Given 12/25/22 0740)  cyclobenzaprine (FLEXERIL) tablet 10 mg (10 mg Oral Given 12/25/22 0740)    ED Course/ Medical Decision Making/ A&P                                 Medical Decision Making This patient presents to the ED with chief complaint(s) of neck pain with pertinent past medical history of hypertension, diabetes which further complicates the presenting complaint. The complaint involves an extensive differential diagnosis and also carries with it a high risk of complications and morbidity.    The differential diagnosis includes no trauma or midline neck tenderness making fracture dislocation unlikely, no neurologic deficits making spinal cord injury or neuropathy unlikely, likely muscle strain or spasm  Additional  history obtained: Additional history obtained from N/A Records reviewed previous admission documents  ED Course and Reassessment: On patient's arrival he is hemodynamically stable in no acute distress without neurologic deficits.  He has no midline neck tenderness with significant paraspinal muscle tenderness on the right greater than left with a tight ropey feeling to palpation consistent with muscle spasm.  Patient will be treated with Tylenol, Toradol, Flexeril and lidocaine patch.  He was recommended stretches and following up with his primary doctor and was given strict return precautions.  Independent labs interpretation:  The following labs were independently interpreted: Accu-Chek normal  Independent visualization of imaging: - N/A  Consultation: - Consulted or discussed management/test interpretation w/ external professional: N/A  Consideration for admission or further workup: Patient has no emergent conditions requiring admission or further work-up at this time and is stable for discharge home with primary care follow-up  Social Determinants of health: N/A    Risk OTC drugs. Prescription drug management.          Final Clinical Impression(s) / ED Diagnoses Final diagnoses:  Cervical paraspinal muscle spasm    Rx / DC Orders ED Discharge Orders          Ordered    cyclobenzaprine (FLEXERIL) 10 MG tablet  2 times daily PRN        12/25/22 0818    lidocaine (LIDODERM) 5 %  Every 24 hours        12/25/22 0818              [name], DO 12/25/22 [phone]

## 2022-12-25 NOTE — ED Triage Notes (Signed)
BIBM from home c/o R-sided neck pain for a few weeks. Taking tylenol with no relief. No injury/trauma.

## 2022-12-25 NOTE — Discharge Instructions (Signed)
You were seen in the emergency department for your neck pain.  Your exam is consistent with spasm in your neck and you had no signs of injury to your bones or nerves.  You can continue to take Tylenol every 6 hours as needed for pain and you can take your home naproxen twice daily.  I have given you Flexeril which is a muscle relaxer that you can take as needed.  It can make you drowsy so do not take it before driving, working or operating heavy machinery.  You can use the lidocaine patches or heat.  You should try to stretch your neck to improve your range of motion and your pain.  You can follow-up with your primary doctors in the next few days to have your symptoms rechecked.  You should return to the emergency department if you have new numbness or weakness in your arms or if you have any other new or concerning symptoms.

## 2023-01-07 ENCOUNTER — Other Ambulatory Visit: Payer: Self-pay

## 2023-02-17 ENCOUNTER — Ambulatory Visit: Payer: Self-pay | Admitting: *Deleted

## 2023-02-17 NOTE — Telephone Encounter (Signed)
Call placed to patient unable to reach message left on VM.   

## 2023-02-17 NOTE — Telephone Encounter (Signed)
  Chief Complaint: lump on center of back per patient and David Wyatt care giver  Symptoms: no pain . Lump on back size of golf ball movable  Frequency: noted today not sure how long on back Pertinent Negatives: Patient denies pain no fever with lump. Patient has back pain from surgery but not lump Disposition: [] ED /[] Urgent Care (no appt availability in office) / [] Appointment(In office/virtual)/ []  [facility]/ [] Home Care/ [x] Refused Recommended Disposition /[] [place] [facility]/ []  Follow-up with PCP Additional Notes:   No available appt prior to 04/21/23. Recommended mobile bus . Patient needs assist with transportation. Please advise and schedule appt and transportation . Patient waiting for call back .   Summary: meds/knot on back   David Wyatt, pt's personal care provider, states he has his medicaid back and no longer using [facility].  He was coming to see Dr Alvis Lemmings.  She states pt needs appt for his meds, but also for a knot he has in the middle of his back, and pt states it is getting bigger. First available for anyone I have is Jan 2.  She says, oh no, he needs to see someone before that. Pt is not w/ her, she has left him for the day,                  Reason for Disposition  [1] Small swelling or lump AND [2] unexplained AND [3] present > 1 week  Answer Assessment - Initial Assessment Questions 1. APPEARANCE of SWELLING: "What does it look like?"     "Knot on skin" per [facility] caregiver and patient  2. SIZE: "How large is the swelling?" (e.g., inches, cm; or compare to size of pinhead, tip of pen, eraser, coin, pea, grape, ping pong ball)      Size of golf ball 3. LOCATION: "Where is the swelling located?"     Back  4. ONSET: "When did the swelling start?"     Na  5. COLOR: "What color is it?" "Is there more than one color?"     na 6. PAIN: "Is there any pain?" If Yes, ask: "How bad is the pain?" (e.g., scale 1-10; or mild, moderate, severe)     - NONE (0): no pain    - MILD (1-3): doesn't interfere with normal activities    - MODERATE (4-7): interferes with normal activities or awakens from sleep    - SEVERE (8-10): excruciating pain, unable to do any normal activities      No pain  from Lump. Pain in back from surgery  7. ITCH: "Does it itch?" If Yes, ask: "How bad is the itch?"      Na  8. CAUSE: "What do you think caused the swelling?" 9 OTHER SYMPTOMS: "Do you have any other symptoms?" (e.g., fever)     Pain in back from surgery/sciatica. No pain from lump. Lump is movable  Protocols used: Skin Lump or Localized Swelling-A-AH

## 2023-02-18 ENCOUNTER — Other Ambulatory Visit: Payer: Self-pay

## 2023-02-18 ENCOUNTER — Telehealth: Payer: Self-pay | Admitting: Family Medicine

## 2023-02-18 ENCOUNTER — Other Ambulatory Visit (HOSPITAL_COMMUNITY): Payer: Self-pay

## 2023-02-18 NOTE — Telephone Encounter (Signed)
Medication Refill -  Most Recent Primary Care Visit:  Provider: Hoy Register  Department: [facility]  Visit Type: OFFICE VISIT  Date: 09/17/2022  Medication: glucose blood (ACCU-CHEK GUIDE) test strip   Has the patient contacted their pharmacy? No  Is this the correct pharmacy for this prescription? Yes If no, delete pharmacy and type the correct one.  This is the patient's preferred pharmacy: [facility] [address], [address] [place] [address] Phone: [phone] Fax: [phone]  Has the prescription been filled recently? Yes  Is the patient out of the medication? Yes  Has the patient been seen for an appointment in the last year OR does the patient have an upcoming appointment? Yes  Can we respond through MyChart? No  Agent: Please be advised that Rx refills may take up to 3 business days. We ask that you follow-up with your pharmacy.  Patient has not checked his sugar in 3 days

## 2023-02-18 NOTE — Telephone Encounter (Signed)
[facility] called and spoke to [place], [facility] about the refill(s) glucose test strips requested. Advised it was sent on 09/17/22 #100/6 refill(s). He says it's showing available, so it will be refilled today.

## 2023-02-19 ENCOUNTER — Other Ambulatory Visit: Payer: Self-pay | Admitting: Family Medicine

## 2023-02-19 ENCOUNTER — Other Ambulatory Visit: Payer: Self-pay

## 2023-02-19 DIAGNOSIS — E1165 Type 2 diabetes mellitus with hyperglycemia: Secondary | ICD-10-CM

## 2023-02-19 MED ORDER — GLUCOSE BLOOD VI STRP
ORAL_STRIP | 6 refills | Status: DC
Start: 2023-02-19 — End: 2023-04-01
  Filled 2023-02-19: qty 100, 33d supply, fill #0

## 2023-02-20 ENCOUNTER — Telehealth: Payer: Self-pay

## 2023-02-20 ENCOUNTER — Ambulatory Visit: Payer: Self-pay

## 2023-02-20 ENCOUNTER — Other Ambulatory Visit: Payer: Self-pay

## 2023-02-20 ENCOUNTER — Encounter: Payer: Self-pay | Admitting: Family Medicine

## 2023-02-20 ENCOUNTER — Ambulatory Visit: Payer: MEDICAID | Attending: Family Medicine | Admitting: Family Medicine

## 2023-02-20 VITALS — BP 122/77 | HR 75 | Ht 68.0 in | Wt 157.8 lb

## 2023-02-20 DIAGNOSIS — E1149 Type 2 diabetes mellitus with other diabetic neurological complication: Secondary | ICD-10-CM

## 2023-02-20 DIAGNOSIS — E1169 Type 2 diabetes mellitus with other specified complication: Secondary | ICD-10-CM

## 2023-02-20 DIAGNOSIS — I10 Essential (primary) hypertension: Secondary | ICD-10-CM

## 2023-02-20 DIAGNOSIS — Z5901 Sheltered homelessness: Secondary | ICD-10-CM

## 2023-02-20 DIAGNOSIS — B1920 Unspecified viral hepatitis C without hepatic coma: Secondary | ICD-10-CM

## 2023-02-20 DIAGNOSIS — I739 Peripheral vascular disease, unspecified: Secondary | ICD-10-CM

## 2023-02-20 DIAGNOSIS — M5126 Other intervertebral disc displacement, lumbar region: Secondary | ICD-10-CM

## 2023-02-20 DIAGNOSIS — Z7985 Long-term (current) use of injectable non-insulin antidiabetic drugs: Secondary | ICD-10-CM

## 2023-02-20 DIAGNOSIS — L729 Follicular cyst of the skin and subcutaneous tissue, unspecified: Secondary | ICD-10-CM

## 2023-02-20 DIAGNOSIS — F17210 Nicotine dependence, cigarettes, uncomplicated: Secondary | ICD-10-CM

## 2023-02-20 DIAGNOSIS — Z1211 Encounter for screening for malignant neoplasm of colon: Secondary | ICD-10-CM

## 2023-02-20 DIAGNOSIS — Z7984 Long term (current) use of oral hypoglycemic drugs: Secondary | ICD-10-CM

## 2023-02-20 LAB — POCT GLYCOSYLATED HEMOGLOBIN (HGB A1C): HbA1c, POC (controlled diabetic range): 7.6 % — AB (ref 0.0–7.0)

## 2023-02-20 MED ORDER — GABAPENTIN 300 MG PO CAPS
300.0000 mg | ORAL_CAPSULE | Freq: Every day | ORAL | 1 refills | Status: DC
  Filled 2023-02-20: qty 90, 90d supply, fill #0

## 2023-02-20 MED ORDER — NICOTINE 7 MG/24HR TD PT24
7.0000 mg | MEDICATED_PATCH | Freq: Every day | TRANSDERMAL | 3 refills | Status: DC
  Filled 2023-02-20: qty 28, 28d supply, fill #0

## 2023-02-20 MED ORDER — AMLODIPINE BESYLATE 10 MG PO TABS
10.0000 mg | ORAL_TABLET | Freq: Every day | ORAL | 1 refills | Status: DC
  Filled 2023-02-20: qty 90, 90d supply, fill #0

## 2023-02-20 NOTE — Progress Notes (Signed)
Subjective:  Patient ID: David Wyatt, male    DOB: 04/10/1960  Age: 62 y.o. MRN: [id number]  CC: Medical Management of Chronic Issues (Tingling right side of body/Back pain)   HPI David Wyatt is a 62 y.o. year old male with a history of  type 2 diabetes mellitus (A1c 7.5),  Homelessness, diabetic neuropathy, hypertension, chronic low back pain (status post previous back surgery), right shoulder osteoarthritis, renal calculi (status post cystoscopy, right ureteroscopy and right ureteral stent placement), medication nonadherence. He resides at [facility].   Interval History: Discussed the use of AI scribe software for clinical note transcription with the patient, who gave verbal consent to proceed.  He presents with persistent tingling on the right side of his body and a growing knot on his back. The tingling has been present for several years, since his back surgery, and is associated with weakness on the right side. He has duloxetine listed on his med list but is unclear if he has been taking this.  CT of the head from 07/2022 was negative for acute intracranial process.  The patient also reports a growing knot on his upper back, which has been present for an unspecified amount of time. He has sought medical attention for this issue in the past, but it remains unresolved. The patient expresses concern about the knot's increasing size, but it is not associated with any pain.  In addition to these issues, the patient has a history of kidney stones and underwent cystoscopy, right ureteral stent placement 3 months ago.  He is not currently seeing a urologist for follow-up care.  The patient also has diabetes, which appears to be well-controlled with his current medication regimen, including Trulicity, glipizide, and metformin. His most recent A1c was 7.6, down from 9.3.        Past Medical History:  Diagnosis Date   Angioedema of lips 07/28/2012   left upper (07/29/2012)    Arthritis    hands and back   Chronic back pain    Cocaine abuse (HCC)    Diabetic neuropathy (HCC)    High cholesterol    Hypertension    Neuropathy    Type II diabetes mellitus (HCC)     Past Surgical History:  Procedure Laterality Date   BACK SURGERY     CYSTOSCOPY W/ URETERAL STENT PLACEMENT Right 02/23/2021   Procedure: CYSTOSCOPY WITH RETROGRADE PYELOGRAM/URETERAL STENT PLACEMENT;  Surgeon: Jannifer Hick, MD;  Location: [facility];  Service: Urology;  Laterality: Right;   CYSTOSCOPY/URETEROSCOPY/HOLMIUM LASER/STENT PLACEMENT Right 12/05/2022   Procedure: CYSTOSCOPY, RIGHT URETEROSCOPY, AND RIGHT URETERAL STENT PLACEMENT;  Surgeon: Adonis Brook, MD;  Location: [facility];  Service: Urology;  Laterality: Right;  90 MINUTES   HERNIA REPAIR Right 04/01/2012   I & D EXTREMITY Left 12/13/2012   Procedure: IRRIGATION AND DEBRIDEMENT LEFT THUMB;  Surgeon: Tami Ribas, MD;  Location: [facility] OR;  Service: Orthopedics;  Laterality: Left;   INCISION AND DRAINAGE OF WOUND     boil on back/notes 07/14/2008 (07/29/2012)   INGUINAL HERNIA REPAIR  04/01/2012   Procedure: HERNIA REPAIR INGUINAL ADULT;  Surgeon: Currie Paris, MD;  Location: [facility] OR;  Service: General;  Laterality: Right;   INGUINAL HERNIA REPAIR Left 09/06/2016   Procedure: OPEN REPAIR LEFT INGUINAL HERNIA;  Surgeon: Gaynelle Adu, MD;  Location: [facility] OR;  Service: General;  Laterality: Left;   INSERTION OF MESH Left 09/06/2016   Procedure: INSERTION OF MESH;  Surgeon: Gaynelle Adu, MD;  Location:  [facility] OR;  Service: General;  Laterality: Left;   TONSILLECTOMY      Family History  Problem Relation Age of Onset   Diabetes Mother    Kidney disease Mother    Hyperlipidemia Mother    Hyperlipidemia Father    Diabetes Father     Social History   Socioeconomic History   Marital status: Divorced    Spouse name: Not on file   Number of children: 3   Years of education: 12   Highest education level: Not on file  Occupational History     Comment: disabled  Tobacco Use   Smoking status: Every Day    Current packs/day: 0.25    Average packs/day: 0.3 packs/day for 30.0 years (7.5 ttl pk-yrs)    Types: Cigarettes   Smokeless tobacco: Former    Quit date: 08/15/2015   Tobacco comments:    04/29/16 2  cigs daily, 10/27/17 sometimes < .25 PPD  Vaping Use   Vaping status: Never Used  Substance and Sexual Activity   Alcohol use: No    Alcohol/week: 0.0 standard drinks of alcohol   Drug use: Yes    Types: "Crack" cocaine    Comment: when stressed   Sexual activity: Not on file  Other Topics Concern   Not on file  Social History Narrative   04/29/17   Lives in shelter   Caffeine- a lot of  tea, coffee   Social Drivers of Corporate investment banker Strain: Not on file  Food Insecurity: Food Insecurity Present (06/20/2022)   Hunger Vital Sign    Worried About Running Out of Food in the Last Year: Sometimes true    Ran Out of Food in the Last Year: Sometimes true  Transportation Needs: Unmet Transportation Needs (06/20/2022)   PRAPARE - Administrator, Civil Service (Medical): Yes    Lack of Transportation (Non-Medical): Patient declined  Physical Activity: Not on file  Stress: Not on file  Social Connections: Not on file    Allergies  Allergen Reactions   Lisinopril Other (See Comments) and Swelling    Lip Swelling, angioedema  angioedema  Lip Swelling  Lip Swelling, angioedema    Outpatient Medications Prior to Visit  Medication Sig Dispense Refill   Accu-Chek Softclix Lancets lancets Use to check blood sugar three times daily. E11.65 100 each 6   acetaminophen (TYLENOL) 325 MG tablet Take 650 mg by mouth every 6 (six) hours as needed for mild pain or moderate pain.     atorvastatin (LIPITOR) 40 MG tablet Take 1 tablet (40 mg total) by mouth daily. 30 tablet 4   Blood Glucose Monitoring Suppl (ACCU-CHEK GUIDE) w/Device KIT Use to check blood sugar three times daily 1 kit 0   cholecalciferol (VITAMIN  D3) 25 MCG (1000 UNIT) tablet Take 1,000 Units by mouth daily.     cyclobenzaprine (FLEXERIL) 10 MG tablet Take 1 tablet (10 mg total) by mouth 2 (two) times daily as needed for muscle spasms. 20 tablet 0   Dulaglutide (TRULICITY) 1.5 MG/0.5ML SOPN Inject 1.5 mg into the skin once a week.     DULoxetine (CYMBALTA) 60 MG capsule Take 1 capsule (60 mg total) by mouth daily. 90 capsule 1   glipiZIDE (GLUCOTROL XL) 10 MG 24 hr tablet Take 1 tablet (10 mg total) by mouth daily with breakfast. 30 tablet 3   glucose blood test strip Use to check blood sugar three times daily. E11.65 100 each 6   hyoscyamine (ANASPAZ)  0.125 MG TBDP disintergrating tablet Place 1 tablet (0.125 mg total) under the tongue every 6 (six) hours as needed for up to 10 doses for bladder spasms. 10 tablet 0   insulin aspart (NOVOLOG) 100 UNIT/ML FlexPen Inject 0-9 Units into the skin 3 (three) times daily with meals. (Patient taking differently: Inject 0-5 Units into the skin 3 (three) times daily with meals. 70-130=0 units 131-180=2 units 181-240=3 Units 241-300= 4 Units 301-400=5 Units Greater than 400 call  MD) 30 mL 1   lidocaine (LIDODERM) 5 % Place 1 patch onto the skin daily. Remove & Discard patch within 12 hours or as directed by MD 30 patch 0   Menthol, Topical Analgesic, 4 % GEL Apply 1 application  topically every 2 (two) hours as needed (Apply to painful muscle joints).     metFORMIN (GLUCOPHAGE) 1000 MG tablet Take 1 tablet (1,000 mg total) by mouth 2 (two) times daily with a meal. 60 tablet 4   naproxen sodium (ALEVE) 220 MG tablet Take 220 mg by mouth 2 (two) times daily as needed (Pain).     nicotine polacrilex (NICORETTE) 4 MG gum Take 4 mg by mouth daily as needed for smoking cessation (between the gum and the cheek every 1-2 hours as needed for cigarette craving).     polyethylene glycol (MIRALAX / GLYCOLAX) 17 g packet Take 17 g by mouth 2 (two) times daily as needed for moderate constipation or mild  constipation.     tamsulosin (FLOMAX) 0.4 MG CAPS capsule Take 1 capsule (0.4 mg total) by mouth daily. 30 capsule 8   traZODone (DESYREL) 100 MG tablet Take 1 tablet (100 mg total) by mouth at bedtime as needed for sleep. (Patient taking differently: Take 100 mg by mouth at bedtime.) 30 tablet 3   amLODipine (NORVASC) 10 MG tablet Take 1 tablet (10 mg total) by mouth daily. 30 tablet 0   famotidine (PEPCID) 10 MG tablet Take 1 tablet (10 mg total) by mouth 2 (two) times daily. (Patient not taking: Reported on 12/03/2022) 60 tablet 3   No facility-administered medications prior to visit.     ROS Review of Systems  Constitutional:  Negative for activity change and appetite change.  HENT:  Negative for sinus pressure and sore throat.   Respiratory:  Negative for chest tightness, shortness of breath and wheezing.   Cardiovascular:  Negative for chest pain and palpitations.  Gastrointestinal:  Negative for abdominal distention, abdominal pain and constipation.  Genitourinary: Negative.   Musculoskeletal: Negative.   Psychiatric/Behavioral:  Negative for behavioral problems and dysphoric mood.     Objective:  BP 122/77   Pulse 75   Ht 5\' 8"  (1.727 m)   Wt 157 lb 12.8 oz (71.6 kg)   SpO2 99%   BMI 23.99 kg/m      02/20/2023   11:18 AM 12/25/2022    8:24 AM 12/25/2022    7:17 AM  BP/Weight  Systolic BP 122 126 125  Diastolic BP 77 86 84  Wt. (Lbs) 157.8  160  BMI 23.99 kg/m2  24.33 kg/m2      Physical Exam Constitutional:      Appearance: He is well-developed.  Neck:     Comments: Soft fluctuant mobile mass in lower neck/upper back which is not tender Cardiovascular:     Rate and Rhythm: Normal rate.     Heart sounds: Normal heart sounds. No murmur heard. Pulmonary:     Effort: Pulmonary effort is normal.     Breath  sounds: Normal breath sounds. No wheezing or rales.  Chest:     Chest wall: No tenderness.  Abdominal:     General: Bowel sounds are normal. There is no  distension.     Palpations: Abdomen is soft. There is no mass.     Tenderness: There is no abdominal tenderness.  Musculoskeletal:        General: Normal range of motion.     Right lower leg: No edema.     Left lower leg: No edema.  Neurological:     Mental Status: He is alert and oriented to person, place, and time.  Psychiatric:        Mood and Affect: Mood normal.    Diabetic Foot Exam - Simple   Simple Foot Form Visual Inspection No deformities, no ulcerations, no other skin breakdown bilaterally: Yes Sensation Testing Intact to touch and monofilament testing bilaterally: Yes Pulse Check See comments: Yes Comments Unable to palpate bilateral posterior tibialis and dorsalis pedis         Latest Ref Rng & Units 12/02/2022   11:40 AM 09/05/2022   12:06 PM 07/22/2022   10:16 PM  CMP  Glucose 70 - 99 mg/dL 161  096  85   BUN 8 - 23 mg/dL 26  26  22    Creatinine 0.61 - 1.24 mg/dL 0.45  4.09  8.11   Sodium 135 - 145 mmol/L 138  129  143   Potassium 3.5 - 5.1 mmol/L 4.1  4.5  3.5   Chloride 98 - 111 mmol/L 106  96  104   CO2 22 - 32 mmol/L 23  24    Calcium 8.9 - 10.3 mg/dL 9.4  8.6      Lipid Panel     Component Value Date/Time   CHOL 213 (H) 05/23/2021 0954   TRIG 115 05/23/2021 0954   HDL 71 05/23/2021 0954   CHOLHDL 3.2 11/03/2019 1003   CHOLHDL 3.6 05/22/2016 0852   VLDL 32 (H) 05/03/2015 0954   LDLCALC 122 (H) 05/23/2021 0954    CBC    Component Value Date/Time   WBC 5.8 12/02/2022 1140   RBC 4.53 12/02/2022 1140   HGB 13.5 12/02/2022 1140   HGB 14.3 11/03/2019 1003   HCT 42.8 12/02/2022 1140   HCT 43.4 11/03/2019 1003   PLT 351 12/02/2022 1140   PLT 295 11/03/2019 1003   MCV 94.5 12/02/2022 1140   MCV 94 11/03/2019 1003   MCH 29.8 12/02/2022 1140   MCHC 31.5 12/02/2022 1140   RDW 13.7 12/02/2022 1140   RDW 12.4 11/03/2019 1003   LYMPHSABS 1.3 09/05/2022 1206   LYMPHSABS 1.9 11/03/2019 1003   MONOABS 0.6 09/05/2022 1206   EOSABS 0.2 09/05/2022  1206   EOSABS 0.3 11/03/2019 1003   BASOSABS 0.0 09/05/2022 1206   BASOSABS 0.1 11/03/2019 1003    Lab Results  Component Value Date   HGBA1C 7.6 (A) 02/20/2023    Assessment & Plan:      Right-sided numbness and tingling Chronic symptoms, worse since back surgery. Currently on duloxetine -unclear if he has been taking it.   No evidence of stroke on CT scan from May 2024. -Start gabapentin for neuropathy. -Continue duloxetine.  Cervical cyst Patient reports a growing mass on the back of the neck. -Order ultrasound to evaluate the cyst.  Type II Diabetes Mellitus Improved control with A1c of 7.6, down from 9.3. -Continue Trulicity 1.5mg  weekly, glipizide 10mg  daily, and metformin 1000mg  twice daily.  Peripheral  Vascular Disease Moderate to extremity disease noted on previous ABI with ABI of 0.79 and the right and 0.77 in the left -Seen by vascular with plan to return in 1 year for follow-up  Patient is a smoker. -Encourage smoking cessation. -Continue statin -Prescribe nicotine patches  General Health Maintenance -Administer influenza vaccine today. -Refer for eye exam, last done over a year ago.          Meds ordered this encounter  Medications   gabapentin (NEURONTIN) 300 MG capsule    Sig: Take 1 capsule (300 mg total) by mouth at bedtime.    Dispense:  90 capsule    Refill:  1   amLODipine (NORVASC) 10 MG tablet    Sig: Take 1 tablet (10 mg total) by mouth daily.    Dispense:  90 tablet    Refill:  1   nicotine (NICODERM CQ) 7 mg/24hr patch    Sig: Place 1 patch (7 mg total) onto the skin daily.    Dispense:  28 patch    Refill:  3    Follow-up: Return in about 3 months (around 05/21/2023) for Chronic medical conditions.       Hoy Register, MD, FAAFP. [facility] [place], [place] [phone]   02/20/2023, 1:11 PM

## 2023-02-20 NOTE — Telephone Encounter (Signed)
At the request of Dr Alvis Lemmings, I met with the patient when he was in the clinic today.  He explained that he is not happy with his current living situation.  He said his income is about $700/month and rent is $600/month.  He explained that the room he has is very small and the other housemates are drug users and he needs to get himself out of that environment as he is trying to better himself.  I told him that I can refer him to [facility] care management for some assistance with housing and he was in agreement.  I also told him that I will check to see if he has a [employer] care management agency in the community and I will provide him with that information as a contact for additional assistance with housing.   I called [employer]: [phone] and spoke to [facility] who said that he has not been assigned a care management organization.  Referral made to [facility] requesting assistance with housing resources and enrolling with a [facility]

## 2023-02-20 NOTE — Telephone Encounter (Signed)
Message from [place] T sent at 02/20/2023  3:10 PM EST  Summary: how to use medication   Patient would like a call back as he does not know where to put the nicotine (NICODERM CQ) 7 mg/24hr patch he recvd today. Please f/u with patient         Chief Complaint: pt was prescribed Nicoderm patch and wanted to know how and where to put it.  Disposition: [] ED /[] Urgent Care (no appt availability in office) / [] Appointment(In office/virtual)/ []  [facility]/ [x] Home Care/ [] Refused Recommended Disposition /[] [place] [facility]/ []  Follow-up with PCP Additional Notes: looked up website [email or web address] and advised pt per the company recommendations. Advised to a clean dry spot, not hairy or broken skin or rash. Advised pt how to apply and how to dispose. Advised that same area where patch was removed can not be used again for 1 week. Pt had no further questions. Advised pt to wash hands after application and disposal.   Reason for Disposition  Caller has medicine question only, adult not sick, AND triager answers question  Answer Assessment - Initial Assessment Questions 1. NAME of MEDICINE: "What medicine(s) are you calling about?"     Nicoderm 2. QUESTION: "What is your question?" (e.g., double dose of medicine, side effect)     How and where to apply patch 3. PRESCRIBER: "Who prescribed the medicine?" Reason: if prescribed by specialist, call should be referred to that group.     PCP 4. SYMPTOMS: "Do you have any symptoms?" If Yes, ask: "What symptoms are you having?"  "How bad are the symptoms (e.g., mild, moderate, severe)     N/a 5. PREGNANCY:  "Is there any chance that you are pregnant?" "When was your last menstrual period?"     N/a  Protocols used: Medication Question Call-A-AH

## 2023-02-20 NOTE — Patient Instructions (Signed)
VISIT SUMMARY:  During today's visit, we discussed your persistent right-sided tingling and weakness, the growing knot on your back, your diabetes management, and your peripheral vascular disease. We also reviewed your general health maintenance needs.  YOUR PLAN:  -RIGHT-SIDED NUMBNESS AND TINGLING: This refers to the persistent tingling and weakness on the right side of your body, which has been present since your back surgery. We will start you on gabapentin for neuropathy and continue your current medication, duloxetine.  -CERVICAL CYST: This is a growing mass on the back of your neck. We will order an ultrasound to evaluate the cyst further.  -DIABETES MELLITUS: This is a condition where your blood sugar levels are too high. Your diabetes is improving, with your A1c down to 7.6 from 9.3. Continue taking Trulicity 1.5mg  weekly, glipizide 10mg  daily, and metformin 1000mg  twice daily.  -PERIPHERAL VASCULAR DISEASE: This is a condition where the blood vessels outside of your heart and brain are narrowed, affecting blood flow. Since you are a smoker, we encourage you to quit smoking and consider using nicotine patches or other aids to help with cessation.  -GENERAL HEALTH MAINTENANCE: We administered your influenza vaccine today and will refer you for an eye exam, as your last one was over a year ago.  INSTRUCTIONS:  Please follow up with the ultrasound for your cervical cyst as soon as it is scheduled. Continue taking your diabetes medications as prescribed and consider smoking cessation aids to help quit smoking. Schedule an eye exam as soon as possible.

## 2023-02-21 LAB — HCV RT-PCR, QUANT (NON-GRAPH)

## 2023-02-23 LAB — MICROALBUMIN / CREATININE URINE RATIO
Creatinine, Urine: 378.6 mg/dL
Microalb/Creat Ratio: 5 mg/g{creat} (ref 0–29)
Microalbumin, Urine: 19.4 ug/mL

## 2023-02-23 LAB — HCV RT-PCR, QUANT (NON-GRAPH)

## 2023-02-23 LAB — HCV AB W REFLEX TO QUANT PCR: HCV Ab: REACTIVE — AB

## 2023-02-25 ENCOUNTER — Telehealth: Payer: Self-pay

## 2023-02-25 NOTE — Telephone Encounter (Signed)
Pt given lab results per notes of Dr. Alvis Lemmings on 02/25/23. Pt verbalized understanding. Pt didn't know he had Hep C but educated as long as no sx or active disease no tx needed per Dr. Alvis Lemmings.   David Register, MD 02/24/2023 12:20 PM EST Back to Top    Please inform him that his urine test is normal and the protein that was present 1 year ago has resolved which is a good sign of his diabetes control.  He has a history of hepatitis C infection but no evidence of active disease and nothing additional needs to be done at this time.

## 2023-02-27 ENCOUNTER — Ambulatory Visit
Admission: RE | Admit: 2023-02-27 | Discharge: 2023-02-27 | Disposition: A | Discharge status: Home / Self Care | Payer: MEDICAID | Source: Ambulatory Visit | Attending: Family Medicine | Admitting: Family Medicine

## 2023-02-27 ENCOUNTER — Telehealth: Payer: Self-pay

## 2023-02-27 ENCOUNTER — Other Ambulatory Visit: Payer: Self-pay | Admitting: Family Medicine

## 2023-02-27 DIAGNOSIS — F17210 Nicotine dependence, cigarettes, uncomplicated: Secondary | ICD-10-CM

## 2023-02-27 DIAGNOSIS — L729 Follicular cyst of the skin and subcutaneous tissue, unspecified: Secondary | ICD-10-CM

## 2023-02-27 DIAGNOSIS — Z5901 Sheltered homelessness: Secondary | ICD-10-CM

## 2023-02-27 DIAGNOSIS — E1149 Type 2 diabetes mellitus with other diabetic neurological complication: Secondary | ICD-10-CM

## 2023-02-27 DIAGNOSIS — E1169 Type 2 diabetes mellitus with other specified complication: Secondary | ICD-10-CM

## 2023-02-27 DIAGNOSIS — Z7985 Long-term (current) use of injectable non-insulin antidiabetic drugs: Secondary | ICD-10-CM

## 2023-02-27 DIAGNOSIS — I739 Peripheral vascular disease, unspecified: Secondary | ICD-10-CM

## 2023-02-27 DIAGNOSIS — M5126 Other intervertebral disc displacement, lumbar region: Secondary | ICD-10-CM

## 2023-02-27 DIAGNOSIS — Z7984 Long term (current) use of oral hypoglycemic drugs: Secondary | ICD-10-CM

## 2023-02-27 DIAGNOSIS — I10 Essential (primary) hypertension: Secondary | ICD-10-CM

## 2023-02-27 DIAGNOSIS — B1920 Unspecified viral hepatitis C without hepatic coma: Secondary | ICD-10-CM

## 2023-02-27 DIAGNOSIS — Z1211 Encounter for screening for malignant neoplasm of colon: Secondary | ICD-10-CM

## 2023-02-27 NOTE — Telephone Encounter (Signed)
Pt was called and is aware of results, DOB was confirmed.  ?

## 2023-02-27 NOTE — Telephone Encounter (Signed)
-----   Message from [place] sent at 02/24/2023 12:20 PM EST ----- Please inform him that his urine test is normal and the protein that was present 1 year ago has resolved which is a good sign of his diabetes control.  He has a history of hepatitis C infection but no evidence of active disease and nothing additional needs to be done at this time.

## 2023-02-28 ENCOUNTER — Ambulatory Visit: Payer: Self-pay

## 2023-02-28 NOTE — Telephone Encounter (Signed)
Chief Complaint: Numbness/tingling on right side of body Symptoms: depression, lay in the bed all day not wanting to do anything Frequency: numbness happened after smoking a cigarette today Pertinent Negatives: Patient denies other symptoms, suicidal thoughts, homicidal thoughts Disposition: [x] ED /[] Urgent Care (no appt availability in office) / [] Appointment(In office/virtual)/ []  [place] [facility]/ [] Home Care/ [] Refused Recommended Disposition /[]  Mobile Bus/ []  Follow-up with PCP Additional Notes: Patient calling to report he has tingling/numbness to the right side of his body after smoking a cigarette, really bad he says. He says he's depressed, doesn't have the desire to do anything but lay in the bed all day, feelings of worthlessness, hopelessness, unable to sleep, difficulty concentrating. He denies thoughts of suicide/homicide. He says he knows the way he feels he's going to end up like that, but no thoughts of wanting to hurt himself. He says he just needs something done about the way he feels with the tingling, worried he may lose his arm or leg. Advised he will need to be evaluated and no availability in the office today. He says he will call back on Monday. Advised he needs evaluation for the numbness/tingling today, so best place would be the ED. He asked what will they do for him there, advised treatment of whatever is going on, tests to figure things out. He agrees, but says he has no one to take him there and he has no money. He says [facility] usually will call him a cab to come to the office. I advised I can call 911. I called and spoke to [facility] and was advised they are really busy and this will be a non-emergent transport, but if the patient gets worse to call 911 back to be triaged, but they will be there as soon as they can. Patient advised of the message from 911 metro, he verbalized understanding. Advised to call if no one is there in 20-30 minutes.    Reason for  Disposition  Patient sounds very sick or weak to the triager  [1] Numbness (i.e., loss of sensation) of the face, arm / hand, or leg / foot on one side of the body AND [2] sudden onset AND [3] brief (now gone)  Answer Assessment - Initial Assessment Questions 1. CONCERN: "What happened that made you call today?"     Right side tingling very bad after smoking and it's happens everytime I smoke a cigarrette 2. DEPRESSION SYMPTOM SCREENING: "How are you feeling overall?" (e.g., decreased energy, increased sleeping or difficulty sleeping, difficulty concentrating, feelings of sadness, guilt, hopelessness, or worthlessness)     Difficulty sleeping, difficulty concentrating, feelings of sadness, hopelessness, worthlessness 3. RISK OF HARM - SUICIDAL IDEATION:  "Do you ever have thoughts of hurting or killing yourself?"  (e.g., yes, no, no but preoccupation with thoughts about death)   - INTENT:  "Do you have thoughts of hurting or killing yourself right NOW?" (e.g., yes, no, N/A)   - PLAN: "Do you have a specific plan for how you would do this?" (e.g., gun, knife, overdose, no plan, N/A)     No, but the way I feel it seems like it's going to happen 4. RISK OF HARM - HOMICIDAL IDEATION:  "Do you ever have thoughts of hurting or killing someone else?"  (e.g., yes, no, no but preoccupation with thoughts about death)   - INTENT:  "Do you have thoughts of hurting or killing someone right NOW?" (e.g., yes, no, N/A)   - PLAN: "Do you  have a specific plan for how you would do this?" (e.g., gun, knife, no plan, N/A)      No 5. FUNCTIONAL IMPAIRMENT: "How have things been going for you overall? Have you had more difficulty than usual doing your normal daily activities?"  (e.g., better, same, worse; self-care, school, work, interactions)     Yes, don't do anything but stay in the bed, disabled 6. SUPPORT: "Who is with you now?" "Who do you live with?" "Do you have family or friends who you can talk to?"      No  one here, have roommates but they are in their room, nobody to talk to  7. THERAPIST: "Do you have a counselor or therapist? Name?"     Yes, Erskine Squibb, talk to her next week 8. STRESSORS: "Has there been any new stress or recent changes in your life?"     No 9. ALCOHOL USE OR SUBSTANCE USE (DRUG USE): "Do you drink alcohol or use any illegal drugs?"     No 10. OTHER: "Do you have any other physical symptoms right now?" (e.g., fever)       Right side tingling after smoking a cigarette  Answer Assessment - Initial Assessment Questions 1. SYMPTOM: "What is the main symptom you are concerned about?" (e.g., weakness, numbness)     Numbness, tingling on one side of body, arms, legs 2. ONSET: "When did this start?" (minutes, hours, days; while sleeping)     After smoking cigarette 3. LAST NORMAL: "When was the last time you (the patient) were normal (no symptoms)?"     Before smoking 4. PATTERN "Does this come and go, or has it been constant since it started?"  "Is it present now?"     Yes with smoking, present now 5. CARDIAC SYMPTOMS: "Have you had any of the following symptoms: chest pain, difficulty breathing, palpitations?"     No 6. NEUROLOGIC SYMPTOMS: "Have you had any of the following symptoms: headache, dizziness, vision loss, double vision, changes in speech, unsteady on your feet?"     No 7. OTHER SYMPTOMS: "Do you have any other symptoms?"     Depression  Protocols used: Depression-A-AH, Neurologic Deficit-A-AH

## 2023-02-28 NOTE — Telephone Encounter (Signed)
noted 

## 2023-03-03 ENCOUNTER — Telehealth: Payer: Self-pay | Admitting: Family Medicine

## 2023-03-03 NOTE — Telephone Encounter (Signed)
Copied from CRM [id number]. Topic: General - Other >> Mar 03, 2023 12:59 PM David Wyatt wrote: Reason for CRM: Pt requesting to speak to Erskine Squibb, pt requesting call back. Pt declined to give additional information.

## 2023-03-03 NOTE — Telephone Encounter (Signed)
I returned the call to the patient and he said he just wanted to talk, nothing was wrong. He said that he gets agitated when he smokes to he has to stop smoking.  I asked him if he had any thoughts of hurting himself and he said no.  He said he has an upcoming appointment but he was not sure with who and when.  I told him that he has a call scheduled with Dickie La, LCSW on 03/13/2023.  He said he would like to talk to her sooner if possible. I told him that I would send her a message with his request.

## 2023-03-04 ENCOUNTER — Telehealth: Payer: Self-pay | Admitting: *Deleted

## 2023-03-04 ENCOUNTER — Ambulatory Visit: Payer: MEDICAID | Admitting: Licensed Clinical Social Worker

## 2023-03-04 NOTE — Telephone Encounter (Signed)
Chart reviewed and [employer] verification completed.   Chart review highlights and potential barriers to colorectal cancer screening (CRC) screening: Per patient's health maintenance, patient has never had colonoscopy. Patient has declined Fecal occult blood, imunochemical specimen order in the past. Patient not eligible for Cologuard in the past due to no recurrent residence in the past. Patient has current GI referral to [facility] for colonoscopy that was placed on 02/20/23 and is pending. Patient 's past GI referrals (10/08/21,05/23/21) to [facility] was closed on 01/23/22 because patient did not return call to [facility] to schedule.  Patient 's also had another past GI referral placed on (06/05/17) to [facility] that were closed on 08/12/17 because patient was a no show for 09/10/17 office visit. Per 07/22/13 office visit note patient had a colonoscopy on 10/12/09.    Telephone call to patient, no answer, left voicemail message, advised calling from Dr. Baxter Flattery office, and requested call back at my direct extension [phone]).   Jagdeep Ancheta H. Docia Furl, Charity fundraiser, [facility] [phone]

## 2023-03-13 ENCOUNTER — Other Ambulatory Visit: Payer: Self-pay | Admitting: Licensed Clinical Social Worker

## 2023-03-13 NOTE — Patient Outreach (Signed)
 Medicaid Managed Care Social Work Note  03/13/2023 Name:  Ladarion Munyon MRN:  [id number] DOB:  [birth year]  Bronson Bressman is an 63 y.o. year old male who is a primary patient of Newlin, Enobong, MD.  The Medicaid Managed Care Coordination team was consulted for assistance with:  Mental Health Counseling and Resources  Mr. Ionescu was given information about Medicaid Managed Care Coordination team services today.   Engaged with patient  for by telephone forinitial visit in response to referral for case management and/or care coordination services.   SDOH: (Social Drivers of Health) assessments and interventions performed: SDOH Interventions    Flowsheet Row Patient Outreach Telephone from 03/13/2023 in [place] [facility] POP[facility]ATION [facility] DEPARTMENT Telephone from 07/25/2022 in [facility] Comm Health [place] - A Dept Of [place]. [facility] Telephone from 04/10/2022 in [facility] Comm Health [place] - A Dept Of [place] [facility]. [facility] Office Visit from 10/08/2021 in [facility] - A Dept Of [facility]. [facility]  SDOH Interventions      Housing Interventions Community Resources Provided Other (Comment)  [He is working with the caseworker at the [facility] to obtain housing.  he said he is not permitted to return to [facility] and he left the last place he was living.  Currently stays at [facility] at night.] Other (Comment) --  Transportation Interventions -- -- Other (Comment) --  Depression Interventions/Treatment  -- -- -- Counseling      Care Plan                 Allergies  Allergen Reactions   Lisinopril  Other (See Comments) and Swelling    Lip Swelling, angioedema  angioedema  Lip Swelling  Lip Swelling, angioedema    Medications Reviewed Today   Medications were not reviewed in this encounter     Patient Active Problem List   Diagnosis Date Noted   MDD (major depressive disorder), recurrent episode, 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   MDD (major depressive  disorder), recurrent severe, 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   Substance induced mood disorder (HCC) 06/18/2022   Homelessness 06/18/2022   Inadequate community resources 06/18/2022   Inability to access community resources due to transportation insecurity 06/18/2022   Cocaine use disorder (HCC) 03/03/2022   Cocaine use disorder, severe, dependence (HCC) 01/21/2022   Passive suicidal ideations 01/20/2022   Acute respiratory failure with hypoxia (HCC) 10/13/2021   History of cocaine use 07/23/2021   Chronic right shoulder pain 07/23/2021   Renal stones 07/23/2021   Dermoid cyst of skin of back 07/23/2021   Uncontrolled type 2 diabetes mellitus with hyperglycemia (HCC) 03/22/2021   Left kidney mass 03/22/2021   Hydronephrosis of right kidney 02/23/2021   Tobacco abuse 08/19/2018   Hyperlipidemia 04/24/2017   GERD (gastroesophageal reflux disease) 06/13/2015   Diabetic neuropathy (HCC) 05/01/2015   Essential hypertension, benign 11/01/2013   Lumbar disc herniation 09/13/2013   Constipation 09/13/2013   Chronic back pain 09/13/2013   Felon of finger of left hand with lymphangitis 12/13/2012   Crack cocaine use 07/29/2012   Arthritis 07/29/2012   Weakness generalized 07/29/2012   Patient has [employer] and was advised to contact program to get connected with their case management program. Patient is agreeable. Patient reports being interested in gaining specifically housing related resources. [facility] LCSW provided patient with extensive education on housing resources within his area. Patient was also provided emotional support over the phone. He denies any SI/HI but admits he is in a  stressful situation regarding his housing and is eager to gain housing. Patient declined wanting information on sober living options located in his area. Patient declined wanting a list of sober living housing options but did write down number for [facility] and agreed to contact them  today.  Plan: Patient will contact [facility] to get connected to their case management program. Patient wrote this number physically down and agreed to contact them as soon as possible to gain these case management services .  Per [facility] program policy, patient will contact [facility] plan and benefits as instructed by [facility] LCSW and [facility] LCSW will sign off at this time as resources have been successfully provided to patient.  Lyle Rung, BSW, MSW, LCSW Licensed Clinical Social Worker [employer]   [facility] [email or web address] Direct Dial : [phone]

## 2023-03-13 NOTE — Patient Instructions (Addendum)
 Tailored Plan Medicaid On July 1, some people on [place] Medicaid will move to a new kind of Medicaid health plan called a Tailored Plan. Tailored Plans cover your doctor visits, prescription drugs, and health care services.    If your [place] Medicaid will move to a Tailored Plan, you should have gotten a letter and welcome packet. If you're not sure, call your [place] Medicaid Enrollment Broker at [phone] and ask.  Check out these free materials, in [place] and [place], to learn more about your Tailored Plan: [email or web address].Gov/Tailored-Plans/Toolkit  Tailored Care Management Services  TCM services are available to you now. If you are a Tailored Plan member or will be and want information about Tailored Care Management Services including rides to appointments and community and home services, call the Care Management provider for your county of residence:    [facility] ([place], [place])  Member Services: [phone] Behavioral Health Crisis Line: (781) 867-5552, [place], [place], [place], [place])  Member Services: (989)[phone] Behavioral Health Crisis Line: 325-425-5447  [facility] Renard Hamper) Member Services: [phone] Behavioral Health Crisis Line: 2706131741     24- Hour Availability:    [facility]  [address] [place], [place] [phone] Crisis [phone]   Family Service of the [facility] [phone]  [place] [facility]  [phone]    [facility]  [phone] (after hours)   Therapeutic Alternative/Mobile Crisis   ([phone]   [place] National Suicide Hotline  (509) 242-8999 Len Childs) [place] [address]   Call [phone] for mental health emergencies   [facility]  [phone]);  [place] and [employer]  [phone]); [place], [place], [place], [place], [place], [place], [place]    [place] [facility]gent  Care for [facility] Residents For 24/7 walk-up access to mental health services for [facility] children (4+), adolescents and adults, please visit the [facility] located at [address] in [place], [place].  *[place] also provides comprehensive outpatient behavioral health services in a variety of locations around the [place].  Connect With [place] [address] [place], [place] [address] HelpLine: (619) 777-9123 or 1-(325) 611-5136  Get Directions  Find Help 24/7 By Phone Call our 24-hour HelpLine at 608-053-3128 or 872-501-0656 for immediate assistance for mental health and substance abuse issues.  Walk-In Help [place]: [facility] (Ages 4 and Up) [place]: Emergency Dept., [facility] Additional Resources National Hopeline Network: 1-800-SUICIDE The National Suicide Prevention Lifeline: 8-338-250-NLZJ          Dickie La, BSW, MSW, LCSW Licensed Clinical Social Worker [employer]   [email or web address] Direct Dial: [phone]

## 2023-03-14 ENCOUNTER — Telehealth: Payer: Self-pay | Admitting: Family Medicine

## 2023-03-14 NOTE — Telephone Encounter (Signed)
Pt. Given lab results, verbalizes understanding. 

## 2023-03-19 ENCOUNTER — Encounter (HOSPITAL_COMMUNITY): Payer: Self-pay

## 2023-03-19 ENCOUNTER — Emergency Department (HOSPITAL_COMMUNITY)
Admission: EM | Admit: 2023-03-19 | Discharge: 2023-03-19 | Disposition: A | Discharge status: Home / Self Care | Payer: MEDICAID | Attending: Emergency Medicine | Admitting: Emergency Medicine

## 2023-03-19 ENCOUNTER — Other Ambulatory Visit: Payer: Self-pay

## 2023-03-19 ENCOUNTER — Telehealth: Payer: Self-pay

## 2023-03-19 DIAGNOSIS — I10 Essential (primary) hypertension: Secondary | ICD-10-CM | POA: Diagnosis not present

## 2023-03-19 DIAGNOSIS — G8929 Other chronic pain: Secondary | ICD-10-CM | POA: Insufficient documentation

## 2023-03-19 DIAGNOSIS — Z5900 Homelessness unspecified: Secondary | ICD-10-CM | POA: Insufficient documentation

## 2023-03-19 DIAGNOSIS — R52 Pain, unspecified: Secondary | ICD-10-CM

## 2023-03-19 DIAGNOSIS — Z79899 Other long term (current) drug therapy: Secondary | ICD-10-CM | POA: Insufficient documentation

## 2023-03-19 DIAGNOSIS — Z7985 Long-term (current) use of injectable non-insulin antidiabetic drugs: Secondary | ICD-10-CM | POA: Insufficient documentation

## 2023-03-19 DIAGNOSIS — Z7984 Long term (current) use of oral hypoglycemic drugs: Secondary | ICD-10-CM | POA: Diagnosis not present

## 2023-03-19 DIAGNOSIS — E119 Type 2 diabetes mellitus without complications: Secondary | ICD-10-CM | POA: Diagnosis not present

## 2023-03-19 DIAGNOSIS — M549 Dorsalgia, unspecified: Secondary | ICD-10-CM | POA: Diagnosis present

## 2023-03-19 DIAGNOSIS — M79605 Pain in left leg: Secondary | ICD-10-CM | POA: Diagnosis not present

## 2023-03-19 DIAGNOSIS — M79604 Pain in right leg: Secondary | ICD-10-CM | POA: Insufficient documentation

## 2023-03-19 DIAGNOSIS — Z794 Long term (current) use of insulin: Secondary | ICD-10-CM | POA: Diagnosis not present

## 2023-03-19 MED ORDER — ACETAMINOPHEN 500 MG PO TABS
1000.0000 mg | ORAL_TABLET | Freq: Once | ORAL | Status: AC
  Administered 2023-03-19: 1000 mg via ORAL
  Filled 2023-03-19: qty 2
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
## 2023-03-19 NOTE — ED Provider Notes (Signed)
 [place] EMERGENCY DEPARTMENT AT [facility] Provider Note   CSN: [id number] Arrival date & time: 03/19/23  0401     History  Chief Complaint  Patient presents with   Homeless   HPI David Wyatt is a 63 y.o. male with history of type 2 diabetes, hypertension, cocaine abuse and chronic back pain presenting for homelessness.  States he is here because he is homeless that he is cold.  States he was recently homeless 2 days ago.  States he is having pain all over his body primarily in the back and both legs.  States this pain has been going on for several years.  He states that the cold is making it worse.  Has not take any medications for his symptoms.  HPI     Home Medications Prior to Admission medications   Medication Sig Start Date End Date Taking? Authorizing Provider  Accu-Chek Softclix Lancets lancets Use to check blood sugar three times daily. E11.65 09/17/22   Newlin, Enobong, MD  acetaminophen  (TYLENOL ) 325 MG tablet Take 650 mg by mouth every 6 (six) hours as needed for mild pain or moderate pain.    [provider]  amLODipine  (NORVASC ) 10 MG tablet Take 1 tablet (10 mg total) by mouth daily. 02/20/23   Newlin, Enobong, MD  atorvastatin  (LIPITOR) 40 MG tablet Take 1 tablet (40 mg total) by mouth daily. 07/25/22   Danton Jon HERO, PA-C  Blood Glucose Monitoring Suppl (ACCU-CHEK GUIDE) w/Device KIT Use to check blood sugar three times daily 09/17/22   Newlin, Enobong, MD  cholecalciferol (VITAMIN D3) 25 MCG (1000 UNIT) tablet Take 1,000 Units by mouth daily.    [provider]  cyclobenzaprine  (FLEXERIL ) 10 MG tablet Take 1 tablet (10 mg total) by mouth 2 (two) times daily as needed for muscle spasms. 12/25/22   Kingsley, Victoria K, DO  Dulaglutide  (TRULICITY ) 1.5 MG/0.5ML SOPN Inject 1.5 mg into the skin once a week.    [provider]  DULoxetine  (CYMBALTA ) 60 MG capsule Take 1 capsule (60 mg total) by mouth daily. 09/17/22   Newlin,  Enobong, MD  famotidine  (PEPCID ) 10 MG tablet Take 1 tablet (10 mg total) by mouth 2 (two) times daily. Patient not taking: Reported on 12/03/2022 07/25/22   Danton Jon HERO, PA-C  gabapentin  (NEURONTIN ) 300 MG capsule Take 1 capsule (300 mg total) by mouth at bedtime. 02/20/23   Newlin, Enobong, MD  glipiZIDE  (GLUCOTROL  XL) 10 MG 24 hr tablet Take 1 tablet (10 mg total) by mouth daily with breakfast. 07/25/22   Danton Jon HERO, PA-C  glucose blood test strip Use to check blood sugar three times daily. E11.65 02/19/23   Newlin, Enobong, MD  hyoscyamine  (ANASPAZ ) 0.125 MG TBDP disintergrating tablet Place 1 tablet (0.125 mg total) under the tongue every 6 (six) hours as needed for up to 10 doses for bladder spasms. 12/05/22   Shane Steffan BROCKS, MD  insulin  aspart (NOVOLOG ) 100 UNIT/ML FlexPen Inject 0-9 Units into the skin 3 (three) times daily with meals. Patient taking differently: Inject 0-5 Units into the skin 3 (three) times daily with meals. 70-130=0 units 131-180=2 units 181-240=3 Units 241-300= 4 Units 301-400=5 Units Greater than 400 call  MD 09/17/22   Delbert Clam, MD  lidocaine  (LIDODERM ) 5 % Place 1 patch onto the skin daily. Remove & Discard patch within 12 hours or as directed by MD 12/25/22   Kingsley, Victoria K, DO  Menthol, Topical Analgesic, 4 % GEL Apply 1 application  topically every 2 (two) hours as needed (Apply to painful muscle joints).    [provider]  metFORMIN  (GLUCOPHAGE ) 1000 MG tablet Take 1 tablet (1,000 mg total) by mouth 2 (two) times daily with a meal. 07/25/22   McClung, Jon HERO, PA-C  naproxen  sodium (ALEVE ) 220 MG tablet Take 220 mg by mouth 2 (two) times daily as needed (Pain).    [provider]  nicotine  (NICODERM CQ ) 7 mg/24hr patch Place 1 patch (7 mg total) onto the skin daily. 02/20/23   Newlin, Enobong, MD  nicotine  polacrilex (NICORETTE ) 4 MG gum Take 4 mg by mouth daily as needed for smoking cessation (between the gum and the  cheek every 1-2 hours as needed for cigarette craving).    [provider]  polyethylene glycol (MIRALAX  / GLYCOLAX ) 17 g packet Take 17 g by mouth 2 (two) times daily as needed for moderate constipation or mild constipation.    [provider]  tamsulosin  (FLOMAX ) 0.4 MG CAPS capsule Take 1 capsule (0.4 mg total) by mouth daily. 12/05/22   Shane Steffan BROCKS, MD  traZODone  (DESYREL ) 100 MG tablet Take 1 tablet (100 mg total) by mouth at bedtime as needed for sleep. Patient taking differently: Take 100 mg by mouth at bedtime. 07/25/22   Danton Jon HERO, PA-C      Allergies    Lisinopril     Review of Systems   See HPI for pertinent positives   Physical Exam   Vitals:   03/19/23 0410 03/19/23 0413  BP: (!) 140/85   Pulse: 68   Resp: 14   Temp: 97.9 F (36.6 C)   SpO2: 96% 98%    CONSTITUTIONAL:  well-appearing, NAD NEURO:  Alert and oriented x 3, CN 3-12 grossly intact EYES:  eyes equal and reactive ENT/NECK:  Supple, no stridor  CARDIO:  regular rate and rhythm, appears well-perfused  PULM:  No respiratory distress GI/GU:  non-distended, soft MSK/SPINE:  No gross deformities, no edema, moves all extremities  SKIN:  no rash, atraumatic   *Additional and/or pertinent findings included in MDM below    ED Results / Procedures / Treatments   Labs (all labs ordered are listed, but only abnormal results are displayed) Labs Reviewed - No data to display  EKG None  Radiology No results found.  Procedures Procedures    Medications Ordered in ED Medications  acetaminophen  (TYLENOL ) tablet 1,000 mg (has no administration in time range)    ED Course/ Medical Decision Making/ A&P                                 Medical Decision Making  63 year old well-appearing male presenting for homelessness.  Exam was unremarkable.  Suspect his pain is chronic.  Provided patient a sandwich and soda and resources for local shelters.  Advised him to follow-up  with his PCP.  Vital stable.  He is in no acute distress.  Discharged in good condition.        Final Clinical Impression(s) / ED Diagnoses Final diagnoses:  Homelessness  Generalized pain    Rx / DC Orders ED Discharge Orders     None         Lang Norleen POUR, PA-C 03/19/23 9271    Charlyn Sora, MD 03/21/23 1413

## 2023-03-19 NOTE — Discharge Instructions (Signed)
 Evaluation today was reassuring.  Recommend you reach out to local resource shelters as they may be able to provide food and lodging.  Also recommend you follow-up your PCP.  Advised Tylenol  ibuprofen  for your generalized body pain along with rest and assertive hydration.

## 2023-03-19 NOTE — Telephone Encounter (Signed)
 The patient has been approved for temporary hotel housing from this afternoon through 03/25/2023 am.  I shared this information with him and reminded him of the rules that he must adhere to at the hotel.  He said he will adhere to all requirements. I provided him with the address for the [facility] on [address] and provided him with bus passes.  I also introduced him to Aloysius Ford, RN/ CNP at [facility] and reminded him that he will need to meet with her at the [facility] on 03/24/2022 after he checks out of the hotel.  He has bus passes to get to [facility] next week. He was also given food from our food pantry.

## 2023-03-19 NOTE — ED Triage Notes (Signed)
 Pt BIB [facility] from gas station and states he is homeless and cold. Pt states that he does have chronic back and leg pain, as well

## 2023-03-19 NOTE — ED Notes (Signed)
 Attempted to discharge pt. Pt requesting to speak to EDP about pain.

## 2023-03-24 ENCOUNTER — Telehealth: Payer: Self-pay

## 2023-03-24 NOTE — Telephone Encounter (Signed)
 Call received from patient inquiring what he is supposed to do tomorrow when he checks out of the hotel.  I explained to him that he is to go to the [facility] in the morning and meet with Aloysius Ford, RN to discuss next steps for housing.  He said he understood and was very adult nurse.

## 2023-04-01 ENCOUNTER — Other Ambulatory Visit (HOSPITAL_COMMUNITY): Payer: Self-pay

## 2023-04-01 ENCOUNTER — Other Ambulatory Visit: Payer: Self-pay

## 2023-04-01 ENCOUNTER — Other Ambulatory Visit (HOSPITAL_BASED_OUTPATIENT_CLINIC_OR_DEPARTMENT_OTHER): Payer: Self-pay

## 2023-04-01 ENCOUNTER — Encounter (HOSPITAL_COMMUNITY): Payer: Self-pay

## 2023-04-01 ENCOUNTER — Emergency Department (HOSPITAL_COMMUNITY)
Admission: EM | Admit: 2023-04-01 | Discharge: 2023-04-01 | Disposition: A | Discharge status: Home / Self Care | Payer: MEDICAID

## 2023-04-01 DIAGNOSIS — Z794 Long term (current) use of insulin: Secondary | ICD-10-CM | POA: Insufficient documentation

## 2023-04-01 DIAGNOSIS — R739 Hyperglycemia, unspecified: Secondary | ICD-10-CM | POA: Insufficient documentation

## 2023-04-01 DIAGNOSIS — I10 Essential (primary) hypertension: Secondary | ICD-10-CM

## 2023-04-01 DIAGNOSIS — E1165 Type 2 diabetes mellitus with hyperglycemia: Secondary | ICD-10-CM

## 2023-04-01 LAB — CBC WITH DIFFERENTIAL/PLATELET
Abs Immature Granulocytes: 0.02 10*3/uL (ref 0.00–0.07)
Basophils Absolute: 0 10*3/uL (ref 0.0–0.1)
Basophils Relative: 1 %
Eosinophils Absolute: 0.2 10*3/uL (ref 0.0–0.5)
Eosinophils Relative: 4 %
HCT: 38 % — ABNORMAL LOW (ref 39.0–52.0)
Hemoglobin: 12.4 g/dL — ABNORMAL LOW (ref 13.0–17.0)
Immature Granulocytes: 0 %
Lymphocytes Relative: 25 %
Lymphs Abs: 1.4 10*3/uL (ref 0.7–4.0)
MCH: 31.3 pg (ref 26.0–34.0)
MCHC: 32.6 g/dL (ref 30.0–36.0)
MCV: 96 fL (ref 80.0–100.0)
Monocytes Absolute: 0.3 10*3/uL (ref 0.1–1.0)
Monocytes Relative: 5 %
Neutro Abs: 3.8 10*3/uL (ref 1.7–7.7)
Neutrophils Relative %: 65 %
Platelets: 325 10*3/uL (ref 150–400)
RBC: 3.96 MIL/uL — ABNORMAL LOW (ref 4.22–5.81)
RDW: 13.6 % (ref 11.5–15.5)
WBC: 5.8 10*3/uL (ref 4.0–10.5)
nRBC: 0 % (ref 0.0–0.2)

## 2023-04-01 LAB — COMPREHENSIVE METABOLIC PANEL
ALT: 7 U/L (ref 0–44)
AST: 19 U/L (ref 15–41)
Albumin: 3.5 g/dL (ref 3.5–5.0)
Alkaline Phosphatase: 86 U/L (ref 38–126)
Anion gap: 11 (ref 5–15)
BUN: 11 mg/dL (ref 8–23)
CO2: 23 mmol/L (ref 22–32)
Calcium: 8.7 mg/dL — ABNORMAL LOW (ref 8.9–10.3)
Chloride: 99 mmol/L (ref 98–111)
Creatinine, Ser: 1.05 mg/dL (ref 0.61–1.24)
GFR, Estimated: >60 mL/min (ref >60)
Glucose, Bld: 575 mg/dL (ref 70–99)
Potassium: 3.8 mmol/L (ref 3.5–5.1)
Sodium: 133 mmol/L — ABNORMAL LOW (ref 135–145)
Total Bilirubin: 0.6 mg/dL (ref 0.0–1.2)
Total Protein: 7 g/dL (ref 6.5–8.1)

## 2023-04-01 LAB — CBG MONITORING, ED: Glucose-Capillary: 208 mg/dL — ABNORMAL HIGH (ref 70–99)

## 2023-04-01 LAB — GLUCOSE, POCT (MANUAL RESULT ENTRY): POC Glucose: 469 mg/dL — AB (ref 70–99)

## 2023-04-01 MED ORDER — INSULIN ASPART 100 UNIT/ML FLEXPEN
0.0000 [IU] | PEN_INJECTOR | Freq: Three times a day (TID) | SUBCUTANEOUS | 1 refills | Status: DC
  Filled 2023-04-01: qty 24, 89d supply, fill #0
  Filled 2023-04-01: qty 15, 50d supply, fill #0
  Filled 2023-04-01: qty 15, 56d supply, fill #0

## 2023-04-01 MED ORDER — COMFORT EZ PEN NEEDLES 31G X 6 MM MISC
1 refills | Status: DC
  Filled 2023-04-01: qty 100, 30d supply, fill #0

## 2023-04-01 MED ORDER — ACCU-CHEK SOFTCLIX LANCETS MISC
1.0000 | Freq: Three times a day (TID) | 0 refills | Status: DC
  Filled 2023-04-01: qty 100, 30d supply, fill #0
  Filled 2023-04-02: qty 100, 34d supply, fill #0
  Filled 2023-04-02: qty 100, 30d supply, fill #0

## 2023-04-01 MED ORDER — BLOOD GLUCOSE MONITOR SYSTEM W/DEVICE KIT
1.0000 | PACK | Freq: Three times a day (TID) | 0 refills | Status: DC
  Filled 2023-04-01: qty 1, fill #0
  Filled 2023-04-02: qty 1, 30d supply, fill #0

## 2023-04-01 MED ORDER — INSULIN ASPART 100 UNIT/ML IJ SOLN
10.0000 [IU] | Freq: Once | INTRAMUSCULAR | Status: AC
  Administered 2023-04-01: 10 [IU] via SUBCUTANEOUS
  Filled 2023-04-01: qty 0.1

## 2023-04-01 MED ORDER — AMLODIPINE BESYLATE 10 MG PO TABS
10.0000 mg | ORAL_TABLET | Freq: Every day | ORAL | 1 refills | Status: DC
  Filled 2023-04-01 (×2): qty 90, 90d supply, fill #0

## 2023-04-01 MED ORDER — LANCET DEVICE MISC
1.0000 | Freq: Three times a day (TID) | 0 refills | Status: AC
  Filled 2023-04-01: qty 1, 30d supply, fill #0

## 2023-04-01 MED ORDER — BLOOD GLUCOSE TEST VI STRP
1.0000 | ORAL_STRIP | Freq: Three times a day (TID) | 0 refills | Status: DC
  Filled 2023-04-01 – 2023-04-02 (×2): qty 100, 34d supply, fill #0
  Filled 2023-04-02: qty 100, 33d supply, fill #0

## 2023-04-01 MED ORDER — METFORMIN HCL 1000 MG PO TABS
1000.0000 mg | ORAL_TABLET | Freq: Two times a day (BID) | ORAL | 4 refills | Status: DC
  Filled 2023-04-01 (×2): qty 60, 30d supply, fill #0

## 2023-04-01 MED ORDER — SODIUM CHLORIDE 0.9 % IV BOLUS
1000.0000 mL | Freq: Once | INTRAVENOUS | Status: AC
  Administered 2023-04-01: 1000 mL via INTRAVENOUS

## 2023-04-01 NOTE — Congregational Nurse Program (Signed)
  Dept: [phone]   Congregational Nurse Program Note  Date of Encounter: 04/01/2023  Past Medical History: Past Medical History:  Diagnosis Date   Angioedema of lips 07/28/2012   left upper (07/29/2012)   Arthritis    hands and back   Chronic back pain    Cocaine abuse (HCC)    Diabetic neuropathy (HCC)    High cholesterol    Hypertension    Neuropathy    Type II diabetes mellitus [facility])     Encounter Details:  Community Questionnaire - 04/01/23 1112       Questionnaire   Ask client: Do you give verbal consent for me to treat you today? Yes    Student Assistance [employer]    Location Patient Served  [facility]    Encounter Setting CN site    Population Status Unhoused    Insurance Medicaid    Insurance/Financial Assistance Referral N/A    Medication Have Medication Insecurities;Patient Medications Reviewed    Medical Provider Yes    Screening Referrals Made N/A    Medical Referrals Made ED;[facility]WEI    Medical Appointment Completed N/A    CNP Interventions Advocate/Support;Navigate Healthcare System;Case Management;Counsel;Educate;Spiritual Care    Screenings CN Performed Blood Pressure;Blood Glucose    ED Visit Averted N/A    Life-Saving Intervention Made Yes            RN checked VS and CBG. CBG is 469. He is dehydrated, fatigued, feeling stiffness in his feet, urinary urgency, and RN able to recognize decline in mentation from five days ago. Client with complaints of back pain and joint stiffness.RN he is diabetic and has medicine stored in a friends home and not sure how quickly he can get medication. This client needs to be evaluated for potential diabetic crisis so this RN will send client to ED to be further evaluated. Client is agreeable.    He also is in need of housing, RN able to secure appointment for Disability Housing with the Disability advocacy center. We scheduled a meeting in office for Jan. 29th at 0900 at [facility] office to assist with housing process.

## 2023-04-01 NOTE — Discharge Instructions (Addendum)
Your medications and including your glucometer and lancets have been sent to [facility] closes at 6pm.   [facility]:  [employer] [phone] [facility] [place] 8p-8am)  [place] ([address] [place]/ entrance off [address] 8p-6:30am)   If you begin to have new or worsening symptoms please return to the ER for immediate evaluation.

## 2023-04-01 NOTE — ED Provider Notes (Cosign Needed Addendum)
[place] EMERGENCY DEPARTMENT AT [facility] Provider Note   CSN: [id number] Arrival date & time: 04/01/23  1142     History  Chief Complaint  Patient presents with   Hypertension    David Wyatt is a 63 y.o. male.  Patient complains of his glucose being high.  Patient was sent here by the congregation nurse because of a elevated glucose.  Patient is out of his medications.  Patient is not sure when he last had his diabetes medications.  The history is provided by the patient. No language interpreter was used.  Hypertension This is a recurrent problem. The problem occurs constantly. Nothing aggravates the symptoms. Nothing relieves the symptoms. He has tried nothing for the symptoms.       Home Medications Prior to Admission medications   Medication Sig Start Date End Date Taking? Authorizing Provider  Accu-Chek Softclix Lancets lancets Use to check blood sugar three times daily. E11.65 09/17/22   Hoy Register, MD  acetaminophen (TYLENOL) 325 MG tablet Take 650 mg by mouth every 6 (six) hours as needed for mild pain or moderate pain.    [provider]  amLODipine (NORVASC) 10 MG tablet Take 1 tablet (10 mg total) by mouth daily. 02/20/23   Hoy Register, MD  atorvastatin (LIPITOR) 40 MG tablet Take 1 tablet (40 mg total) by mouth daily. 07/25/22   Anders Simmonds, PA-C  Blood Glucose Monitoring Suppl (ACCU-CHEK GUIDE) w/Device KIT Use to check blood sugar three times daily 09/17/22   Hoy Register, MD  cholecalciferol (VITAMIN D3) 25 MCG (1000 UNIT) tablet Take 1,000 Units by mouth daily.    [provider]  cyclobenzaprine (FLEXERIL) 10 MG tablet Take 1 tablet (10 mg total) by mouth 2 (two) times daily as needed for muscle spasms. 12/25/22   Elayne Snare K, DO  Dulaglutide (TRULICITY) 1.5 MG/0.5ML SOPN Inject 1.5 mg into the skin once a week.    [provider]  DULoxetine (CYMBALTA) 60 MG capsule Take 1 capsule (60 mg total)  by mouth daily. 09/17/22   Hoy Register, MD  famotidine (PEPCID) 10 MG tablet Take 1 tablet (10 mg total) by mouth 2 (two) times daily. Patient not taking: Reported on 12/03/2022 07/25/22   Anders Simmonds, PA-C  gabapentin (NEURONTIN) 300 MG capsule Take 1 capsule (300 mg total) by mouth at bedtime. 02/20/23   Hoy Register, MD  glipiZIDE (GLUCOTROL XL) 10 MG 24 hr tablet Take 1 tablet (10 mg total) by mouth daily with breakfast. 07/25/22   Anders Simmonds, PA-C  glucose blood test strip Use to check blood sugar three times daily. E11.65 02/19/23   Hoy Register, MD  hyoscyamine (ANASPAZ) 0.125 MG TBDP disintergrating tablet Place 1 tablet (0.125 mg total) under the tongue every 6 (six) hours as needed for up to 10 doses for bladder spasms. 12/05/22   Adonis Brook, MD  insulin aspart (NOVOLOG) 100 UNIT/ML FlexPen Inject 0-9 Units into the skin 3 (three) times daily with meals. Patient taking differently: Inject 0-5 Units into the skin 3 (three) times daily with meals. 70-130=0 units 131-180=2 units 181-240=3 Units 241-300= 4 Units 301-400=5 Units Greater than 400 call  MD 09/17/22   Hoy Register, MD  lidocaine (LIDODERM) 5 % Place 1 patch onto the skin daily. Remove & Discard patch within 12 hours or as directed by MD 12/25/22   Elayne Snare K, DO  Menthol, Topical Analgesic, 4 % GEL Apply 1 application  topically every 2 (two) hours  as needed (Apply to painful muscle joints).    [provider]  metFORMIN (GLUCOPHAGE) 1000 MG tablet Take 1 tablet (1,000 mg total) by mouth 2 (two) times daily with a meal. 07/25/22   McClung, Marzella Schlein, PA-C  naproxen sodium (ALEVE) 220 MG tablet Take 220 mg by mouth 2 (two) times daily as needed (Pain).    [provider]  nicotine (NICODERM CQ) 7 mg/24hr patch Place 1 patch (7 mg total) onto the skin daily. 02/20/23   Hoy Register, MD  nicotine polacrilex (NICORETTE) 4 MG gum Take 4 mg by mouth daily as needed for smoking  cessation (between the gum and the cheek every 1-2 hours as needed for cigarette craving).    [provider]  polyethylene glycol (MIRALAX / GLYCOLAX) 17 g packet Take 17 g by mouth 2 (two) times daily as needed for moderate constipation or mild constipation.    [provider]  tamsulosin (FLOMAX) 0.4 MG CAPS capsule Take 1 capsule (0.4 mg total) by mouth daily. 12/05/22   Adonis Brook, MD  traZODone (DESYREL) 100 MG tablet Take 1 tablet (100 mg total) by mouth at bedtime as needed for sleep. Patient taking differently: Take 100 mg by mouth at bedtime. 07/25/22   Anders Simmonds, PA-C      Allergies    Lisinopril    Review of Systems   Review of Systems  Constitutional:  Positive for fatigue.  All other systems reviewed and are negative.   Physical Exam Updated Vital Signs BP (!) 151/91   Pulse 69   Temp 98.4 F (36.9 C) (Oral)   Resp 20   Ht 5\' 8"  (1.727 m)   Wt 71 kg   SpO2 100%   BMI 23.80 kg/m  Physical Exam Vitals and nursing note reviewed.  Constitutional:      General: He is not in acute distress.    Appearance: He is well-developed.  HENT:     Head: Normocephalic and atraumatic.  Eyes:     Conjunctiva/sclera: Conjunctivae normal.  Cardiovascular:     Rate and Rhythm: Normal rate and regular rhythm.     Heart sounds: No murmur heard. Pulmonary:     Effort: Pulmonary effort is normal. No respiratory distress.     Breath sounds: Normal breath sounds.  Abdominal:     Palpations: Abdomen is soft.     Tenderness: There is no abdominal tenderness.  Musculoskeletal:        General: No swelling.  Skin:    General: Skin is warm and dry.     Capillary Refill: Capillary refill takes less than 2 seconds.  Neurological:     Mental Status: He is alert.  Psychiatric:        Mood and Affect: Mood normal.     ED Results / Procedures / Treatments   Labs (all labs ordered are listed, but only abnormal results are displayed) Labs Reviewed   CBC WITH DIFFERENTIAL/PLATELET - Abnormal; Notable for the following components:      Result Value   RBC 3.96 (*)    Hemoglobin 12.4 (*)    HCT 38.0 (*)    All other components within normal limits  COMPREHENSIVE METABOLIC PANEL - Abnormal; Notable for the following components:   Sodium 133 (*)    Glucose, Bld 575 (*)    Calcium 8.7 (*)    All other components within normal limits    EKG None  Radiology No results found.  Procedures Procedures  Medications Ordered in ED Medications  sodium chloride 0.9 % bolus 1,000 mL (has no administration in time range)  insulin aspart (novoLOG) injection 10 Units (has no administration in time range)    ED Course/ Medical Decision Making/ A&P                                 Medical Decision Making Patient sent here by RN due to elevated glucose.  Patient is currently unhoused and struggling with medications and managing his medical problems.  Amount and/or Complexity of Data Reviewed Labs: ordered. Decision-making details documented in ED Course.    Details: Pt's glucose is 575.  Risk Prescription drug management. Risk Details: Iv fluids ordered, insulin ordered.          Pt's care turned over to Baldo Ash PA at 3:30pm.    Final Clinical Impression(s) / ED Diagnoses Final diagnoses:  Hyperglycemia    Rx / DC Orders ED Discharge Orders     None         Osie Cheeks 04/01/23 1426    [address], [place] 04/01/23 1531    Durwin Glaze, MD 04/03/23 [phone]

## 2023-04-01 NOTE — ED Provider Notes (Signed)
  Physical Exam  BP (!) 151/91   Pulse 69   Temp 98.4 F (36.9 C) (Oral)   Resp 20   Ht 5\' 8"  (1.727 m)   Wt 71 kg   SpO2 100%   BMI 23.80 kg/m   Physical Exam Constitutional:      General: He is not in acute distress.    Appearance: Normal appearance. He is not ill-appearing.  Cardiovascular:     Rate and Rhythm: Normal rate.  Pulmonary:     Effort: Pulmonary effort is normal. No respiratory distress.  Abdominal:     General: There is no distension.  Musculoskeletal:        General: No swelling.  Skin:    Coloration: Skin is not jaundiced or pale.     Findings: No erythema.  Neurological:     Mental Status: He is alert.  Psychiatric:        Mood and Affect: Mood normal.        Behavior: Behavior normal.     Procedures  Procedures  ED Course / MDM    Medical Decision Making Amount and/or Complexity of Data Reviewed Labs: ordered.  Risk OTC drugs. Prescription drug management.   Patient care was transferred over from [facility], [place].  At the time of handoff, plan is to continue to recheck blood sugar after patient has received fluids.  As well as waiting on a TOC consult as this person is currently unhoused and does not have adequate ways of obtaining medications.  On reevaluation patient is alert and oriented and ambulatory.  He is blood sugar had also improved.  Discussed with patient said that he had broke his glucometer a couple weeks ago.  Patient was represcribed a glucometer and associated instruments.  Needles for his insulin were also ordered.  Patient vital signs remained stable throughout the course of his stay here.  Transitions of care also provided resources for him to figure out his housing situation and where to pick up his medications.  I also provided resources for shelters as well as to pick up the medication.  Patient expressed agreement and understanding of plan.  I believe patient safe discharge at this time.       Lunette Stands, PA-C 04/01/23 1806    Coral Spikes, DO 04/02/23 Rich Fuchs

## 2023-04-01 NOTE — Care Management (Addendum)
Transition of Care [facility]) - Emergency Department Mini Assessment   Patient Details  Name: David Wyatt MRN: [id number] Date of Birth: 1960-10-26  Transition of Care [facility]) CM/SW Contact:    Lavenia Atlas, RN Phone Number: 04/01/2023, 3:46 PM   Clinical Narrative: Received TOC consult for medication assistance. Per chart review patient was sent to [facility] ED for hyperglycemia as the congregational RN in the community documented BG 469. ED PA has seen patient and documents BG is 575. This RNCM spoke with patient at bedside who reports he has prescribed insulin stored at a friend's garage and can get it tomorrow. Patient reports he really needs help with housing "I need somewhere to stay, I have problems with my back." This RNCM advised of congregational RN note assisting w/ housing and will follow up to determine the status. This RNCM advised patient of the [facility] shelter: [employer] [phone] [address] [facility] [place] 8p-8am) & [employer] [phone] [place]/ entrance off [address] 8p-6:30am) which opens at [employer]. Will add shelter resources however patient reports he doesn't have a phone. Will print and give to patient to call shelters.  This RNCM notified [name], Congregational  RN who reports she is assisting patient with housing however it may take up to 6-12 months. [name] reports patient can go to [employer] [facility] and sleep in the lobby as they do not have any beds available. Will notify ED PA to determine if patient will dc.  - 4:51pm Notified ED PA re: pen needle prescription needed by [facility] pharmacy. Patient still be worked up. [facility] pharmacy closes at 6pm, resulting in patient picking his medications up tomorrow am.    TOC following.    ED Mini Assessment:    Barriers to Discharge: Continued Medical Work up  [employer] interventions: consult for medication assistance  Means of departure: Public Transportation  Interventions which prevented an  admission or readmission: Homeless Screening, Medication Review    Patient Contact and Communications        ,          Patient states their goals for this hospitalization and ongoing recovery are:: to find housing [employer] Compare Post Acute Care list provided to::  (n/a) Choice offered to / list presented to : NA  Admission diagnosis:  Hypertension ; Hyperglycemia Patient Active Problem List   Diagnosis Date Noted   MDD (major depressive disorder), recurrent episode, 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   MDD (major depressive disorder), recurrent severe, 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   Substance induced mood disorder (HCC) 06/18/2022   Homelessness 06/18/2022   Inadequate community resources 06/18/2022   Inability to access community resources due to transportation insecurity 06/18/2022   Cocaine use disorder (HCC) 03/03/2022   Cocaine use disorder, severe, dependence (HCC) 01/21/2022   Passive suicidal ideations 01/20/2022   Acute respiratory failure with hypoxia (HCC) 10/13/2021   History of cocaine use 07/23/2021   Chronic right shoulder pain 07/23/2021   Renal stones 07/23/2021   Dermoid cyst of skin of back 07/23/2021   Uncontrolled type 2 diabetes mellitus with hyperglycemia (HCC) 03/22/2021   Left kidney mass 03/22/2021   Hydronephrosis of right kidney 02/23/2021   Tobacco abuse 08/19/2018   Hyperlipidemia 04/24/2017   GERD (gastroesophageal reflux disease) 06/13/2015   Diabetic neuropathy (HCC) 05/01/2015   Essential hypertension, benign 11/01/2013   Lumbar disc herniation 09/13/2013   Constipation 09/13/2013   Chronic back pain 09/13/2013   Felon of finger of left hand  with lymphangitis 12/13/2012   Crack cocaine use 07/29/2012   Arthritis 07/29/2012   Weakness generalized 07/29/2012   PCP:  Hoy Register, MD Pharmacy:   [facility] - [facility] [address] [place] [address] Phone: [phone]  Fax: [phone]  [facility][id number] [place], [place] - [address] AT [facility] OF [address] & [address] [place] [address] Phone: [phone] Fax: [phone]  [place] [facility] [address] [place] [address] Phone: [phone] Fax: [phone]  [facility] [address] [place] [address] Phone: [phone] Fax: [phone]

## 2023-04-01 NOTE — ED Triage Notes (Signed)
Patient discharged this am and was in front lobby sleeping and when asked to leave checked back in saying IRC told him he couldn't come back until blood pressure was checked.  Denies sob/cp.

## 2023-04-02 ENCOUNTER — Other Ambulatory Visit (HOSPITAL_COMMUNITY): Payer: Self-pay

## 2023-04-02 ENCOUNTER — Other Ambulatory Visit: Payer: Self-pay

## 2023-04-02 ENCOUNTER — Telehealth: Payer: Self-pay

## 2023-04-02 LAB — GLUCOSE, POCT (MANUAL RESULT ENTRY): POC Glucose: 343 mg/dL — AB (ref 70–99)

## 2023-04-02 NOTE — Congregational Nurse Program (Unsigned)
  Dept: [phone]   Congregational Nurse Program Note  Date of Encounter: 04/02/2023  Past Medical History: Past Medical History:  Diagnosis Date   Angioedema of lips 07/28/2012   left upper (07/29/2012)   Arthritis    hands and back   Chronic back pain    Cocaine abuse (HCC)    Diabetic neuropathy (HCC)    High cholesterol    Hypertension    Neuropathy    Type II diabetes mellitus [facility])     Encounter Details:  Community Questionnaire - 04/02/23 1347       Questionnaire   Ask client: Do you give verbal consent for me to treat you today? Yes    Student Assistance N/A    Location Patient Served  [facility]    Encounter Setting CN site    Population Status Unhoused    Insurance Medicaid    Insurance/Financial Assistance Referral N/A    Medication Have Medication Insecurities;Provided Medication Assistance;Patient Medications Reviewed;Referred to Medication Assistance    Medical Provider Yes    Screening Referrals Made N/A    Medical Referrals Made N/A    Medical Appointment Completed ED    CNP Interventions Advocate/Support;Navigate Healthcare System;Counsel;Case Management;Educate;Spiritual Care    Screenings CN Performed Blood Glucose    ED Visit Averted N/A    Life-Saving Intervention Made N/A            RN followed up with client after he was sent to ED yesterday for elevated CBG, he was treated and prescribed medication. RN has picked up medication from [employer]. These medications were given to client Insulin, metformin, Insulin Pen Needles, and had glucometer at [facility] pharmacy, RN attempting to pick up device because client states his current one is broken. Medicaid will not refill, he will need assistance in paying for it. RN will evaluate resources that may help assist client.  RN rechecked CBG 343. Client took his insulin pen in front of RN. He is wanting income based housing and RN will help facilitate in getting housing. 04/03/23 we will continue with  housing request.  04/03/23- 1230: Client in office RN has called several different ALF for client and [facility] is agreeable to potentially having client and they have beds available. They need notes for client and FL2 and RN will help facilitate that for client. We were also able to set up appointment for next Tuesday for Tuesday Jan 28th at 10:50 am[facility]. We are also trying to get a glucometer for client. His CBG was 567. RN recommended client go back to hospital but he is not agreeable at this time. He will take prescribed medication insulin and metformin. He also just ate a bag of pretzels. He also is feeling like he can't take this no shelter any more, RN provided spiritual care for client and we have plans to see RN in office on Monday.

## 2023-04-02 NOTE — Telephone Encounter (Signed)
Message received that the patient was requesting a call back: [phone].  I returned the call to the patient and he said he was in the ED and now has nowhere to go.  I explained to him that he needs to contact the [facility].  I also reminded him that when he went to the motel he was supposed to go to the [facility] on Tues 03/25/2023 morning after he checked out of the motel and he did not do that.   He said his back hurts and he can't sleep in a chair at the [facility].  I told him again that he needs to follow up with the [facility] and he said okay.

## 2023-04-03 LAB — GLUCOSE, POCT (MANUAL RESULT ENTRY): POC Glucose: 537 mg/dL — AB (ref 70–99)

## 2023-04-07 ENCOUNTER — Telehealth (INDEPENDENT_AMBULATORY_CARE_PROVIDER_SITE_OTHER): Payer: Self-pay | Admitting: Primary Care

## 2023-04-07 NOTE — Telephone Encounter (Signed)
Called pt to remind them about atp. VM was left

## 2023-04-08 ENCOUNTER — Ambulatory Visit (INDEPENDENT_AMBULATORY_CARE_PROVIDER_SITE_OTHER): Payer: MEDICAID | Admitting: Primary Care

## 2023-04-08 ENCOUNTER — Encounter (INDEPENDENT_AMBULATORY_CARE_PROVIDER_SITE_OTHER): Payer: Self-pay | Admitting: Primary Care

## 2023-04-08 ENCOUNTER — Other Ambulatory Visit: Payer: Self-pay | Admitting: Pharmacist

## 2023-04-08 ENCOUNTER — Other Ambulatory Visit: Payer: Self-pay

## 2023-04-08 VITALS — BP 149/94 | HR 88 | Resp 16 | Ht 68.0 in | Wt 157.6 lb

## 2023-04-08 DIAGNOSIS — Z5900 Homelessness unspecified: Secondary | ICD-10-CM | POA: Diagnosis not present

## 2023-04-08 DIAGNOSIS — E1165 Type 2 diabetes mellitus with hyperglycemia: Secondary | ICD-10-CM

## 2023-04-08 DIAGNOSIS — Z09 Encounter for follow-up examination after completed treatment for conditions other than malignant neoplasm: Secondary | ICD-10-CM | POA: Diagnosis not present

## 2023-04-08 DIAGNOSIS — Z111 Encounter for screening for respiratory tuberculosis: Secondary | ICD-10-CM | POA: Diagnosis not present

## 2023-04-08 DIAGNOSIS — Z7984 Long term (current) use of oral hypoglycemic drugs: Secondary | ICD-10-CM

## 2023-04-08 DIAGNOSIS — Z794 Long term (current) use of insulin: Secondary | ICD-10-CM

## 2023-04-08 LAB — GLUCOSE, POCT (MANUAL RESULT ENTRY): POC Glucose: 141 mg/dL — AB (ref 70–99)

## 2023-04-08 MED ORDER — ACCU-CHEK GUIDE W/DEVICE KIT: PACK | 0 refills | Status: DC

## 2023-04-08 MED ORDER — ACCU-CHEK SOFTCLIX LANCETS MISC: 6 refills | Status: DC

## 2023-04-08 MED ORDER — ACCU-CHEK GUIDE TEST VI STRP: ORAL_STRIP | 6 refills | Status: DC

## 2023-04-08 NOTE — Congregational Nurse Program (Signed)
  Dept: [phone]   Congregational Nurse Program Note  Date of Encounter: 04/08/2023  Past Medical History: Past Medical History:  Diagnosis Date   Angioedema of lips 07/28/2012   left upper (07/29/2012)   Arthritis    hands and back   Chronic back pain    Cocaine abuse (HCC)    Diabetic neuropathy (HCC)    High cholesterol    Hypertension    Neuropathy    Type II diabetes mellitus [facility])     Encounter Details:  Community Questionnaire - 04/08/23 1132       Questionnaire   Ask client: Do you give verbal consent for me to treat you today? Yes    Student Assistance N/A    Location Patient Served  [facility]    Encounter Setting CN site    Population Status Unhoused    Insurance Medicaid    Insurance/Financial Assistance Referral N/A    Medication Have Medication Insecurities;Provided Medication Assistance;Patient Medications Reviewed;Referred to Medication Assistance    Medical Provider Yes    Screening Referrals Made N/A    Medical Referrals Made N/A    Medical Appointment Completed [facility]    CNP Interventions Advocate/Support;Navigate Healthcare System;Counsel;Case Management;Educate;Spiritual Care    Screenings CN Performed N/A    ED Visit Averted N/A    Life-Saving Intervention Made N/A            RN assisted client to medical appointment to advocate for new glucometer and FL2. FL2 sent to primary PCP and case management. He also was sent a new prescription for [facility]com[facility] says that they can fill for client. RN advocated for clients needs as we work on getting him to ALF. We have meeting tomorrow with [facility] to fill out intake paperwork.

## 2023-04-08 NOTE — Progress Notes (Signed)
Subjective:   [name] is a 63 y.o. male presents for emergency room follow-up.  The patient presented to the ED  congregation nurse sent him for blood sugars being elevated and out of his medications.  Blood pressure remains elevated.  [name] is present advocate for the patient he is homeless sleeping in a chair at [facility] and also goes to [facility] when they are open.  Thus looking at these complications it will be difficult to manage his blood pressure, hyperlipidemia and diabetes due to the fact of instability and not able to manage his own foods. RN has picked up his insulin and he has medication at this time.  Note [name] and patient stated when it is cold outside and he has nowhere to go he does not take his medication because it is not a priority at the time trying to stay warm is.  He has a history of back surgery and uses a roller walker for gait stability and due to the weather being cold it is create more pain and stiffness.  Patient endorses polyuria and polydipsia.  Patient has No headache, No chest pain, No abdominal pain - No Nausea, No new weakness tingling or numbness, No Cough - shortness of breath.  A1c has improved from 12.4-7.6.  The improvement came when he was able to stay with a friend and and eat healthier and take medication as prescribed. Past Medical History:  Diagnosis Date   Angioedema of lips 07/28/2012   left upper (07/29/2012)   Arthritis    hands and back   Chronic back pain    Cocaine abuse (HCC)    Diabetic neuropathy (HCC)    High cholesterol    Hypertension    Neuropathy    Type II diabetes mellitus (HCC)      Allergies  Allergen Reactions   Lisinopril Other (See Comments) and Swelling    Lip Swelling, angioedema  angioedema  Lip Swelling  Lip Swelling, angioedema    Current Outpatient Medications on File Prior to Visit  Medication Sig Dispense Refill   acetaminophen (TYLENOL) 325 MG tablet Take 650 mg by mouth every 6 (six) hours as needed  for mild pain or moderate pain.     amLODipine (NORVASC) 10 MG tablet Take 1 tablet (10 mg total) by mouth daily. 90 tablet 1   atorvastatin (LIPITOR) 40 MG tablet Take 1 tablet (40 mg total) by mouth daily. 30 tablet 4   cholecalciferol (VITAMIN D3) 25 MCG (1000 UNIT) tablet Take 1,000 Units by mouth daily.     cyclobenzaprine (FLEXERIL) 10 MG tablet Take 1 tablet (10 mg total) by mouth 2 (two) times daily as needed for muscle spasms. 20 tablet 0   Dulaglutide (TRULICITY) 1.5 MG/0.5ML SOPN Inject 1.5 mg into the skin once a week.     DULoxetine (CYMBALTA) 60 MG capsule Take 1 capsule (60 mg total) by mouth daily. 90 capsule 1   famotidine (PEPCID) 10 MG tablet Take 1 tablet (10 mg total) by mouth 2 (two) times daily. (Patient not taking: Reported on 12/03/2022) 60 tablet 3   gabapentin (NEURONTIN) 300 MG capsule Take 1 capsule (300 mg total) by mouth at bedtime. 90 capsule 1   glipiZIDE (GLUCOTROL XL) 10 MG 24 hr tablet Take 1 tablet (10 mg total) by mouth daily with breakfast. 30 tablet 3   hyoscyamine (ANASPAZ) 0.125 MG TBDP disintergrating tablet Place 1 tablet (0.125 mg total) under the tongue every 6 (six) hours as needed for up  to 10 doses for bladder spasms. 10 tablet 0   insulin aspart (NOVOLOG) 100 UNIT/ML FlexPen Inject 0-9 Units into the skin 3 (three) times daily with meals. 30 mL 1   Insulin Pen Needle (COMFORT EZ PEN NEEDLES) 31G X 6 MM MISC use to inject insulin 3 times daily 100 each 1   Lancet Device MISC 1 each by Does not apply route in the morning, at noon, and at bedtime. May substitute to any manufacturer covered by patient's insurance. 1 each 0   lidocaine (LIDODERM) 5 % Place 1 patch onto the skin daily. Remove & Discard patch within 12 hours or as directed by MD 30 patch 0   Menthol, Topical Analgesic, 4 % GEL Apply 1 application  topically every 2 (two) hours as needed (Apply to painful muscle joints).     metFORMIN (GLUCOPHAGE) 1000 MG tablet Take 1 tablet (1,000 mg total)  by mouth 2 (two) times daily with a meal. 60 tablet 4   naproxen sodium (ALEVE) 220 MG tablet Take 220 mg by mouth 2 (two) times daily as needed (Pain).     nicotine (NICODERM CQ) 7 mg/24hr patch Place 1 patch (7 mg total) onto the skin daily. 28 patch 3   nicotine polacrilex (NICORETTE) 4 MG gum Take 4 mg by mouth daily as needed for smoking cessation (between the gum and the cheek every 1-2 hours as needed for cigarette craving).     polyethylene glycol (MIRALAX / GLYCOLAX) 17 g packet Take 17 g by mouth 2 (two) times daily as needed for moderate constipation or mild constipation.     tamsulosin (FLOMAX) 0.4 MG CAPS capsule Take 1 capsule (0.4 mg total) by mouth daily. 30 capsule 8   traZODone (DESYREL) 100 MG tablet Take 1 tablet (100 mg total) by mouth at bedtime as needed for sleep. (Patient taking differently: Take 100 mg by mouth at bedtime.) 30 tablet 3   No current facility-administered medications on file prior to visit.    Review of System: ROS Comprehensive ROS Pertinent positive and negative noted in HPI   Objective:  BP (!) 149/94 (BP Location: Left Arm, Patient Position: Sitting)   Pulse 88   Resp 16   Ht 5\' 8"  (1.727 m)   Wt 157 lb 9.6 oz (71.5 kg)   SpO2 97%   BMI 23.96 kg/m   Filed Weights   04/08/23 1114  Weight: 157 lb 9.6 oz (71.5 kg)    Physical Exam Vitals reviewed.  Constitutional:      Appearance: Normal appearance. He is normal weight.  HENT:     Head: Normocephalic.     Comments: Has dentures not wearing at this visit    Right Ear: There is impacted cerumen.     Left Ear: Tympanic membrane and external ear normal.  Eyes:     Extraocular Movements: Extraocular movements intact.  Cardiovascular:     Rate and Rhythm: Normal rate and regular rhythm.  Pulmonary:     Effort: Pulmonary effort is normal.     Breath sounds: Normal breath sounds.  Abdominal:     General: Bowel sounds are normal.     Palpations: Abdomen is soft.  Musculoskeletal:      Cervical back: Normal range of motion and neck supple.     Comments: L5-S3 with radiating pain down both legs.  Right side weakness/ pain  Skin:    General: Skin is warm and dry.  Neurological:     Mental Status: He is alert  and oriented to person, place, and time.  Psychiatric:        Mood and Affect: Mood normal.        Behavior: Behavior normal.        Thought Content: Thought content normal.      Assessment:  Ramond was seen today for hospitalization follow-up.  Diagnoses and all orders for this visit:  Uncontrolled type 2 diabetes mellitus with hyperglycemia (HCC) -     POCT glucose (manual entry) 7.6 vast improvement from 12.4  He has all his medication refilled.   Hospital discharge follow-up See HPI  Homelessness -     Qu[employer]-TB [employer]       This note has been created with Education officer, environmental. Any transcriptional errors are unintentional.   Keep scheduled appt with PCP  Grayce Sessions, NP 04/08/2023, 4:19 PM

## 2023-04-09 NOTE — Congregational Nurse Program (Signed)
RN arranged meeting with [facility] case worker Okey Regal [phone] Ext [phone]. We were able get David Wyatt in for targeted housing with a projected move in date 6 months to 1 year from today. We are waiting on his proof of income to complete process then will be complete.  We are also working on getting client into ALF to assist with his needs for temporary housing. Working on getting FL2 and remaining documents to get client into ALF.

## 2023-04-10 NOTE — Congregational Nurse Program (Signed)
  Dept: [phone]   Congregational Nurse Program Note  Date of Encounter: 04/10/2023  Past Medical History: Past Medical History:  Diagnosis Date   Angioedema of lips 07/28/2012   left upper (07/29/2012)   Arthritis    hands and back   Chronic back pain    Cocaine abuse (HCC)    Diabetic neuropathy (HCC)    High cholesterol    Hypertension    Neuropathy    Type II diabetes mellitus [facility])     Encounter Details:  Community Questionnaire - 04/10/23 1443       Questionnaire   Ask client: Do you give verbal consent for me to treat you today? Yes    Student Assistance N/A    Location Patient Served  [facility]    Encounter Setting CN site    Population Status Unhoused    Insurance Medicaid    Insurance/Financial Assistance Referral N/A    Medication Have Medication Insecurities;Provided Medication Assistance;Patient Medications Reviewed;Referred to Medication Assistance    Medical Provider Yes    Screening Referrals Made N/A    Medical Referrals Made N/A    Medical Appointment Completed [facility]    CNP Interventions Advocate/Support;Navigate Healthcare System;Counsel;Case Management;Educate;Spiritual Care    Screenings CN Performed N/A    ED Visit Averted N/A    Life-Saving Intervention Made N/A           RN assisted in getting client his glucometer, strips, and needles for reduced affordable price. He was able to pick up prescriptions today.

## 2023-04-12 LAB — QUANTIFERON-TB GOLD PLUS
QuantiFERON Mitogen Value: >10 [IU]/mL
QuantiFERON Nil Value: 0 [IU]/mL
QuantiFERON TB1 Ag Value: 0 [IU]/mL
QuantiFERON TB2 Ag Value: 0.01 [IU]/mL
QuantiFERON-TB Gold Plus: NEGATIVE

## 2023-04-13 ENCOUNTER — Encounter (INDEPENDENT_AMBULATORY_CARE_PROVIDER_SITE_OTHER): Payer: Self-pay | Admitting: Primary Care

## 2023-04-14 ENCOUNTER — Telehealth: Payer: Self-pay

## 2023-04-14 NOTE — Telephone Encounter (Signed)
Completed FL2 sent to Erick Blinks, RN/ [facility] who is assisting the patient with his housing search

## 2023-04-15 NOTE — Congregational Nurse Program (Signed)
 RN has been working with this client on assisted living housing. We were able to get FL2 and [place] completed. We found a bed at [facility] that would accept him into their facility on 04/16/23. However, client no longer wants to pursue this as an option for housing solution. We are still looking at targeted housing with [facility] in the next six to twelve months. We will continue looking for other options for client.

## 2023-04-30 ENCOUNTER — Ambulatory Visit: Payer: Medicaid Other | Admitting: Family Medicine

## 2023-05-18 ENCOUNTER — Encounter (HOSPITAL_COMMUNITY): Payer: Self-pay | Admitting: Emergency Medicine

## 2023-05-18 ENCOUNTER — Emergency Department (HOSPITAL_COMMUNITY)
Admission: EM | Admit: 2023-05-18 | Discharge: 2023-05-19 | Disposition: A | Discharge status: Other Acute Inpatient Hospital | Payer: MEDICAID | Attending: Emergency Medicine | Admitting: Emergency Medicine

## 2023-05-18 ENCOUNTER — Other Ambulatory Visit: Payer: Self-pay

## 2023-05-18 DIAGNOSIS — Y900 Blood alcohol level of less than 20 mg/100 ml: Secondary | ICD-10-CM | POA: Diagnosis not present

## 2023-05-18 DIAGNOSIS — I10 Essential (primary) hypertension: Secondary | ICD-10-CM | POA: Diagnosis not present

## 2023-05-18 DIAGNOSIS — Z720 Tobacco use: Secondary | ICD-10-CM | POA: Insufficient documentation

## 2023-05-18 DIAGNOSIS — Z5900 Homelessness unspecified: Secondary | ICD-10-CM | POA: Diagnosis not present

## 2023-05-18 DIAGNOSIS — F12988 Cannabis use, unspecified with other cannabis-induced disorder: Secondary | ICD-10-CM | POA: Insufficient documentation

## 2023-05-18 DIAGNOSIS — Z7985 Long-term (current) use of injectable non-insulin antidiabetic drugs: Secondary | ICD-10-CM | POA: Insufficient documentation

## 2023-05-18 DIAGNOSIS — Z794 Long term (current) use of insulin: Secondary | ICD-10-CM | POA: Insufficient documentation

## 2023-05-18 DIAGNOSIS — Z7984 Long term (current) use of oral hypoglycemic drugs: Secondary | ICD-10-CM | POA: Diagnosis not present

## 2023-05-18 DIAGNOSIS — F331 Major depressive disorder, recurrent, moderate: Secondary | ICD-10-CM | POA: Diagnosis not present

## 2023-05-18 DIAGNOSIS — E1165 Type 2 diabetes mellitus with hyperglycemia: Secondary | ICD-10-CM | POA: Diagnosis not present

## 2023-05-18 DIAGNOSIS — Z5982 Transportation insecurity: Secondary | ICD-10-CM | POA: Insufficient documentation

## 2023-05-18 DIAGNOSIS — Z79899 Other long term (current) drug therapy: Secondary | ICD-10-CM | POA: Diagnosis not present

## 2023-05-18 DIAGNOSIS — R45851 Suicidal ideations: Secondary | ICD-10-CM | POA: Insufficient documentation

## 2023-05-18 DIAGNOSIS — F1410 Cocaine abuse, uncomplicated: Secondary | ICD-10-CM | POA: Diagnosis not present

## 2023-05-18 LAB — COMPREHENSIVE METABOLIC PANEL
ALT: 13 U/L (ref 0–44)
AST: 19 U/L (ref 15–41)
Albumin: 3.5 g/dL (ref 3.5–5.0)
Alkaline Phosphatase: 69 U/L (ref 38–126)
Anion gap: 9 (ref 5–15)
BUN: 13 mg/dL (ref 8–23)
CO2: 24 mmol/L (ref 22–32)
Calcium: 8.9 mg/dL (ref 8.9–10.3)
Chloride: 105 mmol/L (ref 98–111)
Creatinine, Ser: 1.21 mg/dL (ref 0.61–1.24)
GFR, Estimated: >60 mL/min (ref >60)
Glucose, Bld: 260 mg/dL — ABNORMAL HIGH (ref 70–99)
Potassium: 4.1 mmol/L (ref 3.5–5.1)
Sodium: 138 mmol/L (ref 135–145)
Total Bilirubin: 0.7 mg/dL (ref 0.0–1.2)
Total Protein: 6.5 g/dL (ref 6.5–8.1)

## 2023-05-18 LAB — CBG MONITORING, ED
Glucose-Capillary: 223 mg/dL — ABNORMAL HIGH (ref 70–99)
Glucose-Capillary: 213 mg/dL — ABNORMAL HIGH (ref 70–99)

## 2023-05-18 LAB — CBC
HCT: 41.5 % (ref 39.0–52.0)
Hemoglobin: 13.6 g/dL (ref 13.0–17.0)
MCH: 31.3 pg (ref 26.0–34.0)
MCHC: 32.8 g/dL (ref 30.0–36.0)
MCV: 95.6 fL (ref 80.0–100.0)
Platelets: 455 10*3/uL — ABNORMAL HIGH (ref 150–400)
RBC: 4.34 MIL/uL (ref 4.22–5.81)
RDW: 13.3 % (ref 11.5–15.5)
WBC: 5.9 10*3/uL (ref 4.0–10.5)
nRBC: 0 % (ref 0.0–0.2)

## 2023-05-18 LAB — RAPID URINE DRUG SCREEN, HOSP PERFORMED
Amphetamines: NOT DETECTED
Barbiturates: NOT DETECTED
Benzodiazepines: NOT DETECTED
Cocaine: POSITIVE — AB
Opiates: NOT DETECTED
Tetrahydrocannabinol: NOT DETECTED

## 2023-05-18 LAB — SALICYLATE LEVEL: Salicylate Lvl: <7 mg/dL — ABNORMAL LOW (ref 7.0–30.0)

## 2023-05-18 LAB — ETHANOL: Alcohol, Ethyl (B): <10 mg/dL (ref <10)

## 2023-05-18 LAB — ACETAMINOPHEN LEVEL: Acetaminophen (Tylenol), Serum: <10 ug/mL — ABNORMAL LOW (ref 10–30)

## 2023-05-18 MED ORDER — METFORMIN HCL 500 MG PO TABS
1000.0000 mg | ORAL_TABLET | Freq: Two times a day (BID) | ORAL | Status: DC
Start: 2023-05-18 — End: 2023-05-19
  Administered 2023-05-18: 1000 mg via ORAL
  Filled 2023-05-18: qty 2

## 2023-05-18 MED ORDER — INSULIN ASPART 100 UNIT/ML IJ SOLN
0.0000 [IU] | Freq: Three times a day (TID) | INTRAMUSCULAR | Status: DC
  Administered 2023-05-18: 3 [IU] via SUBCUTANEOUS

## 2023-05-18 MED ORDER — ACETAMINOPHEN 325 MG PO TABS: 650.0000 mg | ORAL_TABLET | Freq: Four times a day (QID) | ORAL | Status: DC | PRN

## 2023-05-18 MED ORDER — GLIPIZIDE ER 10 MG PO TB24
10.0000 mg | ORAL_TABLET | Freq: Every day | ORAL | Status: DC
Start: 2023-05-19 — End: 2023-05-19
  Filled 2023-05-18: qty 1

## 2023-05-18 MED ORDER — INSULIN ASPART 100 UNIT/ML FLEXPEN: 0.0000 [IU] | PEN_INJECTOR | Freq: Three times a day (TID) | SUBCUTANEOUS | Status: DC

## 2023-05-18 NOTE — BH Assessment (Signed)
 Comprehensive Clinical Assessment (CCA) Note  05/18/2023 [name] [id number]  DISPOSITION: Gave clinical report to Roselyn Bering, NP who recommended Pt be transferred to [facility] for continuous assessment. Notified [facility] staff of recommendation. Notified Dr. Vanetta Mulders and Melynda Keller, RN of recommendation via secure message.  The patient demonstrates the following risk factors for suicide: Chronic risk factors for suicide include: psychiatric disorder of major depressive disorder, substance use disorder, and medical illness diabetes . Acute risk factors for suicide include: social withdrawal/isolation and housing . Protective factors for this patient include: hope for the future. Considering these factors, the overall suicide risk at this point appears to be low. Patient is appropriate for outpatient follow up.  Pt is a 63 year old divorced male who presents unaccompanied to [facility] ED reporting suicidal ideation, cocaine use, and homelessness. Pt says he came to the emergency department, "because I have nowhere to stay." He has a history of chronic homelessness and states he has not had a place to stay for the past 2-3 weeks. He has a documented history of major depressive disorder and history of substance use. He says he smokes $20-60 worth of crack daily when available. He denies other substance use. Pt's drug screen is positive for cocaine and alcohol level is negative.  Pt describes his mood as depressed. He acknowledges symptoms including social withdrawal, loss of interest in usual pleasures, fatigue, irritability, decreased sleep, and feelings of hopelessness. He reports passive suicidal thoughts with no plan or intent to harm himself. He denies history of suicide attempts. Pt denies any history of intentional self-injurious behaviors. Pt denies current homicidal ideation or history of violence. Pt denies any history of auditory hallucinations. He reports history of vague visual  hallucinations that he cannot describe.   Pt gives brief answers to questions with no elaboration. He states he is unemployed and has received disability for years. He cannot identify any family or friends who are supportive. He says he has three children but has not communicated with them for years. He denies history of abuse. He denies current legal problems. He denies access to firearms, although he told the EDP, "everyone does these days."   Pt says he is receiving outpatient mental health services through [facility]. He says he is not taking any psychiatric medications. Medical record indicates he has received inpatient psychiatric treatment at several facilities in the past and has been in [facility].  Pt is dressed in hospital scrubs, alert and oriented x4. Pt speaks in a clear tone, at moderate volume and normal pace. Motor behavior appears normal. Eye contact is good. Pt's mood is depressed and affect is congruent with mood. Thought process is coherent and relevant. There is no indication he is currently responding to internal stimuli or experiencing delusional thought content. He is minimally cooperative during assessment. He says he would like substance abuse treatment and assistance with housing.  Chief Complaint:  Chief Complaint  Patient presents with   Suicidal   Visit Diagnosis:  F12.20 Cannabis use disorder, Severe Z59.0 Homelessness   CCA Screening, Triage and Referral (STR)  Patient Reported Information How did you hear about [place]? Self  What Is the Reason for Your Visit/Call Today? Pt states he came to ED due to homelessness and cocaine addiction.  How Long Has This Been Causing You Problems? 1 wk - 1 month  What Do You Feel Would Help You the Most Today? Alcohol or Drug Use Treatment; Housing Assistance   Have You Recently Had Any Thoughts About Hurting  Yourself? Yes  Are You Planning to Commit Suicide/Harm Yourself At This time? No   Flowsheet Row ED from 05/18/2023 in  [facility] Emergency Department at [facility] ED from 04/01/2023 in [facility] Emergency Department at [facility] ED from 03/19/2023 in [facility] Emergency Department at [facility]  C-SSRS RISK CATEGORY Low Risk No Risk No Risk       Have you Recently Had Thoughts About Hurting Someone Karolee Ohs? No  Are You Planning to Harm Someone at This Time? No  Explanation: Pt reports passive suicidal thought with no plan or intent to harm himself.   Have You Used Any Alcohol or Drugs in the Past 24 Hours? Yes  How Long Ago Did You Use Drugs or Alcohol? Last night  What Did You Use and How Much? $30 worth of cocaine   Do You Currently Have a Therapist/Psychiatrist? Yes  Name of Therapist/Psychiatrist: Name of Therapist/Psychiatrist: Has outpatient provider through [facility]   Have You Been Recently Discharged From Any Office Practice or Programs? No  Explanation of Discharge From Practice/Program: N/A     CCA Screening Triage Referral Assessment Type of Contact: Tele-Assessment  Telemedicine Service Delivery: Telemedicine service delivery: This service was provided via telemedicine using a 2-way, interactive audio and video technology  Is this Initial or Reassessment? Is this Initial or Reassessment?: Initial Assessment  Date Telepsych consult ordered in [facility]:  Date Telepsych consult ordered in [facility]: 05/18/23  Time Telepsych consult ordered in [facility]:  Time Telepsych consult ordered in [facility]: 1538  Location of Assessment: [facility] ED  Provider Location: [facility] Assessment Services   Collateral Involvement: Medical record   Does Patient Have a Automotive engineer Guardian? No  Legal Guardian Contact Information: Pt does not have a legal guardian  Copy of Legal Guardianship Form: -- (Pt does not have a legal guardian)  Legal Guardian Notified of Arrival: -- (Pt does not have a legal guardian)  Legal Guardian Notified of Pending Discharge: -- (Pt does not have  a legal guardian)  If Minor and Not Living with Parent(s), Who has Custody? Pt is an adult  Is CPS involved or ever been involved? Never  Is APS involved or ever been involved? Never   Patient Determined To Be At Risk for Harm To Self or Others Based on Review of Patient Reported Information or Presenting Complaint? No  Method: No Plan  Availability of Means: No access or NA  Intent: Vague intent or NA  Notification Required: No need or identified person  Additional Information for Danger to Others Potential: -- (None)  Additional Comments for Danger to Others Potential: Pt denies history of violence  Are There Guns or Other Weapons in Your Home? No  Types of Guns/Weapons: Pt denies access to firearms  Are These Weapons Safely Secured?                            -- (Pt denies access to firearms)  Who Could Verify You Are Able To Have These Secured: Pt denies access to firearms  Do You Have any Outstanding Charges, Pending Court Dates, Parole/Probation? Pt denies current legal problems  Contacted To Inform of Risk of Harm To Self or Others: Other: Comment (NA)    Does Patient Present under Involuntary Commitment? No    [place] of Residence: [place]   Patient Currently Receiving the Following Services: Individual Therapy   Determination of Need: Routine (7 days)  Options For Referral: Outpatient Therapy; Medication Management; Chemical Dependency Intensive Outpatient Therapy (CDIOP); BH Urgent Care     CCA Biopsychosocial Patient Reported Schizophrenia/Schizoaffective Diagnosis in Past: No   Strengths: Patient utilizes resources   Mental Health Symptoms Depression:  Change in energy/activity; Fatigue; Hopelessness; Irritability; Sleep (too much or little)   Duration of Depressive symptoms: Duration of Depressive Symptoms: Greater than two weeks   Mania:  None   Anxiety:   Irritability; Sleep; Tension; Worrying   Psychosis:  None   Duration of  Psychotic symptoms:    Trauma:  None   Obsessions:  None   Compulsions:  None   Inattention:  None   Hyperactivity/Impulsivity:  None   Oppositional/Defiant Behaviors:  None   Emotional Irregularity:  None   Other Mood/Personality Symptoms:  None noted    Mental Status Exam Appearance and self-care  Stature:  Average   Weight:  Average weight   Clothing:  -- (Scrubs)   Grooming:  Normal   Cosmetic use:  None   Posture/gait:  Normal   Motor activity:  Not Remarkable   Sensorium  Attention:  Normal   Concentration:  Normal   Orientation:  X5   Recall/memory:  Normal   Affect and Mood  Affect:  Appropriate   Mood:  Depressed   Relating  Eye contact:  Normal   Facial expression:  Responsive   Attitude toward examiner:  Cooperative   Thought and Language  Speech flow: Normal   Thought content:  Appropriate to Mood and Circumstances   Preoccupation:  None   Hallucinations:  None   Organization:  Coherent   [employer] of Knowledge:  Average   Intelligence:  Average   Abstraction:  Normal   Judgement:  Fair   Dance movement psychotherapist:  Adequate   Insight:  Fair   Decision Making:  Normal   Social Functioning  Social Maturity:  Isolates   Social Judgement:  "Chief of Staff"; Normal   Stress  Stressors:  Housing; Office manager Ability:  Normal   Skill Deficits:  None   Supports:  Support needed     Religion: Religion/Spirituality Are You A Religious Person?: No How Might This Affect Treatment?: NA  Leisure/Recreation: Leisure / Recreation Do You Have Hobbies?: No  Exercise/Diet: Exercise/Diet Do You Exercise?: No Have You Gained or Lost A Significant Amount of Weight in the Past Six Months?: No Do You Follow a Special Diet?: No Do You Have Any Trouble Sleeping?: Yes Explanation of Sleeping Difficulties: Pt reports sleeping 3-4 hours per night.   CCA Employment/Education Employment/Work  Situation: Employment / Work Systems developer: On disability Why is Patient on Disability: back issues/past surgery How Long has Patient Been on Disability: Many years Patient's Job has Been Impacted by Current Illness: No Has Patient ever Been in the [employer]?: No  Education: Education Is Patient Currently Attending School?: No Last Grade Completed: 12 Did You Attend College?: No Did You Have An Individualized Education Program (IIEP): No Did You Have Any Difficulty At School?: No Patient's Education Has Been Impacted by Current Illness: No   CCA Family/Childhood History Family and Relationship History: Family history Marital status: Divorced Divorced, when?: Unknown What types of issues is patient dealing with in the relationship?: NA Additional relationship information: NA Does patient have children?: Yes How many children?: 3 How is patient's relationship with their children?: Estranged from three children  Childhood History:  Childhood History By whom was/is the patient raised?: Both parents Did  patient suffer any verbal/emotional/physical/sexual abuse as a child?: No Did patient suffer from severe childhood neglect?: No Has patient ever been sexually abused/assaulted/raped as an adolescent or adult?: No Was the patient ever a victim of a crime or a disaster?: No Witnessed domestic violence?: No Has patient been affected by domestic violence as an adult?: No       CCA Substance Use Alcohol/Drug Use: Alcohol / Drug Use Pain Medications: Denies abuse Prescriptions: Denies abuse Over the Counter: Denies abuse History of alcohol / drug use?: Yes Longest period of sobriety (when/how long): Unknown Negative Consequences of Use: Financial, Personal relationships Withdrawal Symptoms: None Substance #1 Name of Substance 1: Cocaine 1 - Age of First Use: 20s 1 - Amount (size/oz): $20-60 worth 1 - Frequency: Daily when available 1 - Duration: Ongoing  for years 1 - Last Use / Amount: 05/17/2023, $30 worth 1 - Method of Aquiring: Dealer 1- Route of Use: Smoke inhalation                       ASAM's:  Six Dimensions of Multidimensional Assessment  Dimension 1:  Acute Intoxication and/or Withdrawal Potential:   Dimension 1:  Description of individual's past and current experiences of substance use and withdrawal: Pt reports daily cocaine use  Dimension 2:  Biomedical Conditions and Complications:   Dimension 2:  Description of patient's biomedical conditions and  complications: Diabetes  Dimension 3:  Emotional, Behavioral, or Cognitive Conditions and Complications:  Dimension 3:  Description of emotional, behavioral, or cognitive conditions and complications: History of MDD  Dimension 4:  Readiness to Change:  Dimension 4:  Description of Readiness to Change criteria: Pt requesting substance abuse treatment  Dimension 5:  Relapse, Continued use, or Continued Problem Potential:  Dimension 5:  Relapse, continued use, or continued problem potential critiera description: Pt has brief episodes of sobriety  Dimension 6:  Recovery/Living Environment:  Dimension 6:  Recovery/Iiving environment criteria description: Homeless  ASAM Severity Score: ASAM's Severity Rating Score: 11  ASAM Recommended Level of Treatment: ASAM Recommended Level of Treatment: Level II Partial Hospitalization Treatment   Substance use Disorder (SUD) Substance Use Disorder (SUD)  Checklist Symptoms of Substance Use: Continued use despite having a persistent/recurrent physical/psychological problem caused/exacerbated by use, Continued use despite persistent or recurrent social, interpersonal problems, caused or exacerbated by use, Large amounts of time spent to obtain, use or recover from the substance(s), Persistent desire or unsuccessful efforts to cut down or control use, Presence of craving or strong urge to use, Recurrent use that results in a failure to fulfill  major role obligations (work, school, home), Repeated use in physically hazardous situations, Social, occupational, recreational activities given up or reduced due to use, Substance(s) often taken in larger amounts or over longer times than was intended  Recommendations for Services/Supports/Treatments: Recommendations for Services/Supports/Treatments Recommendations For Services/Supports/Treatments: CD-IOP Intensive Chemical Dependency Program  Disposition Recommendation per psychiatric provider: We recommend transfer to [facility].   DSM5 Diagnoses: Patient Active Problem List   Diagnosis Date Noted   MDD (major depressive disorder), recurrent episode, 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   MDD (major depressive disorder), recurrent severe, 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   Substance induced mood disorder (HCC) 06/18/2022   Homelessness 06/18/2022   Inadequate community resources 06/18/2022   Inability to access community resources due to transportation insecurity 06/18/2022   Cocaine use disorder (HCC) 03/03/2022   Cocaine use disorder, severe, dependence (HCC) 01/21/2022   Passive suicidal ideations 01/20/2022  Acute respiratory failure with hypoxia (HCC) 10/13/2021   History of cocaine use 07/23/2021   Chronic right shoulder pain 07/23/2021   Renal stones 07/23/2021   Dermoid cyst of skin of back 07/23/2021   Uncontrolled type 2 diabetes mellitus with hyperglycemia (HCC) 03/22/2021   Left kidney mass 03/22/2021   Hydronephrosis of right kidney 02/23/2021   Tobacco abuse 08/19/2018   Hyperlipidemia 04/24/2017   GERD (gastroesophageal reflux disease) 06/13/2015   Diabetic neuropathy (HCC) 05/01/2015   Essential hypertension, benign 11/01/2013   Lumbar disc herniation 09/13/2013   Constipation 09/13/2013   Chronic back pain 09/13/2013   Felon of finger of left hand with lymphangitis 12/13/2012   Crack cocaine use 07/29/2012   Arthritis 07/29/2012    Weakness generalized 07/29/2012     Referrals to Alternative Service(s): Referred to Alternative Service(s):   Place:   Date:   Time:    Referred to Alternative Service(s):   Place:   Date:   Time:    Referred to Alternative Service(s):   Place:   Date:   Time:    Referred to Alternative Service(s):   Place:   Date:   Time:     Pamalee Leyden, [facility]

## 2023-05-18 NOTE — ED Notes (Signed)
 Belongings placed in purple storage. No locker was available so same is sitting on pts walker.

## 2023-05-18 NOTE — ED Provider Notes (Signed)
 [place] EMERGENCY DEPARTMENT AT [facility] Provider Note   CSN: [id number] Arrival date & time: 05/18/23  4098     History  Chief Complaint  Patient presents with   Suicidal    David Wyatt is a 63 y.o. male.  Patient with history of diabetes, anemia, hypertension, cocaine abuse presents today with complaints of suicidal ideation. States that his medication and his documents have all been lost which has caused him to feel this way. He denies any plan. Denies HI or AVH. No somatic complaints. States that he just wants to eat a good meal and have a good nights rest. When asked if he has access to firearms he responds "everyone does these days." Endorses using crack yesterday evening.   The history is provided by the patient. No language interpreter was used.       Home Medications Prior to Admission medications   Medication Sig Start Date End Date Taking? Authorizing Provider  Accu-Chek Softclix Lancets lancets Use to check blood sugar 3 times daily. 04/08/23   Hoy Register, MD  acetaminophen (TYLENOL) 325 MG tablet Take 650 mg by mouth every 6 (six) hours as needed for mild pain or moderate pain.    [provider]  amLODipine (NORVASC) 10 MG tablet Take 1 tablet (10 mg total) by mouth daily. 04/01/23   Elson Areas, PA-C  atorvastatin (LIPITOR) 40 MG tablet Take 1 tablet (40 mg total) by mouth daily. 07/25/22   Anders Simmonds, PA-C  Blood Glucose Monitoring Suppl (ACCU-CHEK GUIDE) w/Device KIT Use to check blood sugar 3 times daily. 04/08/23   Hoy Register, MD  cholecalciferol (VITAMIN D3) 25 MCG (1000 UNIT) tablet Take 1,000 Units by mouth daily.    [provider]  cyclobenzaprine (FLEXERIL) 10 MG tablet Take 1 tablet (10 mg total) by mouth 2 (two) times daily as needed for muscle spasms. 12/25/22   Elayne Snare K, DO  Dulaglutide (TRULICITY) 1.5 MG/0.5ML SOPN Inject 1.5 mg into the skin once a week.    [provider]   DULoxetine (CYMBALTA) 60 MG capsule Take 1 capsule (60 mg total) by mouth daily. 09/17/22   Hoy Register, MD  famotidine (PEPCID) 10 MG tablet Take 1 tablet (10 mg total) by mouth 2 (two) times daily. Patient not taking: Reported on 12/03/2022 07/25/22   Anders Simmonds, PA-C  gabapentin (NEURONTIN) 300 MG capsule Take 1 capsule (300 mg total) by mouth at bedtime. 02/20/23   Hoy Register, MD  glipiZIDE (GLUCOTROL XL) 10 MG 24 hr tablet Take 1 tablet (10 mg total) by mouth daily with breakfast. 07/25/22   Anders Simmonds, PA-C  glucose blood (ACCU-CHEK GUIDE TEST) test strip Use to check blood sugar 3 times daily. 04/08/23   Hoy Register, MD  hyoscyamine (ANASPAZ) 0.125 MG TBDP disintergrating tablet Place 1 tablet (0.125 mg total) under the tongue every 6 (six) hours as needed for up to 10 doses for bladder spasms. 12/05/22   Adonis Brook, MD  insulin aspart (NOVOLOG) 100 UNIT/ML FlexPen Inject 0-9 Units into the skin 3 (three) times daily with meals. 04/01/23   Elson Areas, PA-C  Insulin Pen Needle (COMFORT EZ PEN NEEDLES) 31G X 6 MM MISC use to inject insulin 3 times daily 04/01/23   Lunette Stands, PA-C  lidocaine (LIDODERM) 5 % Place 1 patch onto the skin daily. Remove & Discard patch within 12 hours or as directed by MD 12/25/22   [name], DO  Menthol,  Topical Analgesic, 4 % GEL Apply 1 application  topically every 2 (two) hours as needed (Apply to painful muscle joints).    [provider]  metFORMIN (GLUCOPHAGE) 1000 MG tablet Take 1 tablet (1,000 mg total) by mouth 2 (two) times daily with a meal. 04/01/23   Elson Areas, PA-C  naproxen sodium (ALEVE) 220 MG tablet Take 220 mg by mouth 2 (two) times daily as needed (Pain).    [provider]  nicotine (NICODERM CQ) 7 mg/24hr patch Place 1 patch (7 mg total) onto the skin daily. 02/20/23   Hoy Register, MD  nicotine polacrilex (NICORETTE) 4 MG gum Take 4 mg by mouth daily as needed for  smoking cessation (between the gum and the cheek every 1-2 hours as needed for cigarette craving).    [provider]  polyethylene glycol (MIRALAX / GLYCOLAX) 17 g packet Take 17 g by mouth 2 (two) times daily as needed for moderate constipation or mild constipation.    [provider]  tamsulosin (FLOMAX) 0.4 MG CAPS capsule Take 1 capsule (0.4 mg total) by mouth daily. 12/05/22   Adonis Brook, MD  traZODone (DESYREL) 100 MG tablet Take 1 tablet (100 mg total) by mouth at bedtime as needed for sleep. Patient taking differently: Take 100 mg by mouth at bedtime. 07/25/22   Anders Simmonds, PA-C      Allergies    Lisinopril    Review of Systems   Review of Systems  Psychiatric/Behavioral:  Positive for suicidal ideas.   All other systems reviewed and are negative.   Physical Exam Updated Vital Signs BP 132/79   Pulse (!) 55   Temp 98.7 F (37.1 C) (Oral)   Resp 20   Ht 5\' 8"  (1.727 m)   Wt 74.8 kg   SpO2 94%   BMI 25.09 kg/m  Physical Exam Vitals and nursing note reviewed.  Constitutional:      General: He is not in acute distress.    Appearance: Normal appearance. He is normal weight. He is not ill-appearing, toxic-appearing or diaphoretic.     Comments: Resting comfortably on exam  HENT:     Head: Normocephalic and atraumatic.  Cardiovascular:     Rate and Rhythm: Normal rate.  Pulmonary:     Effort: Pulmonary effort is normal. No respiratory distress.  Musculoskeletal:        General: Normal range of motion.     Cervical back: Normal range of motion.  Skin:    General: Skin is warm and dry.  Neurological:     General: No focal deficit present.     Mental Status: He is alert.  Psychiatric:        Mood and Affect: Mood normal.        Behavior: Behavior normal.     Comments: Does not appear to be responding to internal stimuli     ED Results / Procedures / Treatments   Labs (all labs ordered are listed, but only abnormal results are  displayed) Labs Reviewed  COMPREHENSIVE METABOLIC PANEL - Abnormal; Notable for the following components:      Result Value   Glucose, Bld 260 (*)    All other components within normal limits  CBC - Abnormal; Notable for the following components:   Platelets 455 (*)    All other components within normal limits  ETHANOL  RAPID URINE DRUG SCREEN, HOSP PERFORMED  ACETAMINOPHEN LEVEL  SALICYLATE LEVEL    EKG None  Radiology No  results found.  Procedures Procedures    Medications Ordered in ED Medications - No data to display  ED Course/ Medical Decision Making/ A&P                                 Medical Decision Making Amount and/or Complexity of Data Reviewed Labs: ordered.   Patient is a 63 y.o. male  who presents to the emergency department for psychiatric complaint.  Past Medical History:  has a past medical history of Angioedema of lips (07/28/2012), Arthritis, Chronic back pain, Cocaine abuse (HCC), Diabetic neuropathy (HCC), High cholesterol, Hypertension, Neuropathy, and Type II diabetes mellitus (HCC).  Physical Exam: Resting comfortably, calm and cooperative.  Does not appear to be responding to internal stimuli  Labs: Medical clearance labs ordered, with following pertinent results: glucose 260, no other acute laboratory abnormalities  QID CBGs ordered for glucose monitoring while in the ER  Cardiac monitoring: EKG obtained and interpreted by attending physician which shows: sinus bradycardia  Medications: I ordered medication including patients home medications specifically his insulin and metformin for his diabetes. I have reviewed the patients home medicines and have made adjustments as needed.  Disposition: Patient is otherwise medically cleared at this time pending medical clearance laboratory evaluation. Will consult TTS and appreciate their recommendations.  Final Clinical Impression(s) / ED Diagnoses Final diagnoses:  Suicidal ideation     Rx / DC Orders ED Discharge Orders     None         Vear Clock 05/18/23 1552    Linwood Dibbles, MD 05/19/23 [id number]

## 2023-05-18 NOTE — ED Triage Notes (Signed)
 Patient bib ems from streets for suicidal ideation. Reports use of crack tonight. Denies HI.

## 2023-05-18 NOTE — ED Notes (Signed)
 Pt given purple scrubs to change into. Once pt is dressed out will call security to wand pt.

## 2023-05-18 NOTE — ED Notes (Signed)
Pt participating in TTS at this time.

## 2023-05-18 NOTE — ED Notes (Signed)
 Pt ambulated to restroom with walker & back to treatment room.

## 2023-05-18 NOTE — ED Notes (Signed)
Pt given coffee per request

## 2023-05-18 NOTE — ED Notes (Signed)
 Security wading pt now.

## 2023-05-18 NOTE — ED Provider Triage Note (Signed)
 Emergency Medicine Provider Triage Evaluation Note  David Wyatt , a 63 y.o. male  was evaluated in triage.  Pt complains of suicidal ideations.  He states he lost his place, ID card, as well as other courts.  He states he has no motivation to live anymore.  Denies HI, or AVH.  No history of psych illness.  Review of Systems  Positive: As above  Negative: As above  Physical Exam  BP 138/88   Pulse 71   Temp 98.5 F (36.9 C)   Resp 20   Ht 5\' 8"  (1.727 m)   Wt 74.8 kg   SpO2 98%   BMI 25.09 kg/m  Gen:   Awake, no distress   Resp:  Normal effort  MSK:   Moves extremities without difficulty  Other:    Medical Decision Making  Medically screening exam initiated at 8:44 AM.  Appropriate orders placed.  [name] was informed that the remainder of the evaluation will be completed by another provider, this initial triage assessment does not replace that evaluation, and the importance of remaining in the ED until their evaluation is complete.    Marita [place], PA-C 05/18/23 [phone]

## 2023-05-19 ENCOUNTER — Encounter: Payer: Self-pay | Admitting: Emergency Medicine

## 2023-05-19 ENCOUNTER — Ambulatory Visit (HOSPITAL_COMMUNITY)
Admission: EM | Admit: 2023-05-19 | Discharge: 2023-05-19 | Disposition: A | Discharge status: Other Acute Inpatient Hospital | Payer: MEDICAID | Attending: Nurse Practitioner | Admitting: Nurse Practitioner

## 2023-05-19 ENCOUNTER — Inpatient Hospital Stay
Admission: AD | Admit: 2023-05-19 | Discharge: 2023-05-28 | DRG: 885 | Disposition: A | Discharge status: Home / Self Care | Payer: MEDICAID | Source: Intra-hospital | Attending: Psychiatry | Admitting: Psychiatry

## 2023-05-19 ENCOUNTER — Other Ambulatory Visit: Payer: Self-pay

## 2023-05-19 DIAGNOSIS — F1410 Cocaine abuse, uncomplicated: Secondary | ICD-10-CM | POA: Insufficient documentation

## 2023-05-19 DIAGNOSIS — F1520 Other stimulant dependence, uncomplicated: Secondary | ICD-10-CM | POA: Diagnosis present

## 2023-05-19 DIAGNOSIS — Z79899 Other long term (current) drug therapy: Secondary | ICD-10-CM | POA: Diagnosis not present

## 2023-05-19 DIAGNOSIS — E1140 Type 2 diabetes mellitus with diabetic neuropathy, unspecified: Secondary | ICD-10-CM | POA: Diagnosis present

## 2023-05-19 DIAGNOSIS — G479 Sleep disorder, unspecified: Secondary | ICD-10-CM | POA: Diagnosis present

## 2023-05-19 DIAGNOSIS — F332 Major depressive disorder, recurrent severe without psychotic features: Secondary | ICD-10-CM | POA: Diagnosis present

## 2023-05-19 DIAGNOSIS — F322 Major depressive disorder, single episode, severe without psychotic features: Secondary | ICD-10-CM | POA: Diagnosis present

## 2023-05-19 DIAGNOSIS — Z5982 Transportation insecurity: Secondary | ICD-10-CM | POA: Diagnosis not present

## 2023-05-19 DIAGNOSIS — Z7985 Long-term (current) use of injectable non-insulin antidiabetic drugs: Secondary | ICD-10-CM

## 2023-05-19 DIAGNOSIS — F12988 Cannabis use, unspecified with other cannabis-induced disorder: Secondary | ICD-10-CM | POA: Diagnosis not present

## 2023-05-19 DIAGNOSIS — Z5948 Other specified lack of adequate food: Secondary | ICD-10-CM

## 2023-05-19 DIAGNOSIS — R45851 Suicidal ideations: Secondary | ICD-10-CM | POA: Insufficient documentation

## 2023-05-19 DIAGNOSIS — F331 Major depressive disorder, recurrent, moderate: Secondary | ICD-10-CM | POA: Insufficient documentation

## 2023-05-19 DIAGNOSIS — Z841 Family history of disorders of kidney and ureter: Secondary | ICD-10-CM

## 2023-05-19 DIAGNOSIS — E1165 Type 2 diabetes mellitus with hyperglycemia: Secondary | ICD-10-CM

## 2023-05-19 DIAGNOSIS — R197 Diarrhea, unspecified: Secondary | ICD-10-CM | POA: Diagnosis not present

## 2023-05-19 DIAGNOSIS — Z83438 Family history of other disorder of lipoprotein metabolism and other lipidemia: Secondary | ICD-10-CM

## 2023-05-19 DIAGNOSIS — Z7984 Long term (current) use of oral hypoglycemic drugs: Secondary | ICD-10-CM

## 2023-05-19 DIAGNOSIS — Z794 Long term (current) use of insulin: Secondary | ICD-10-CM

## 2023-05-19 DIAGNOSIS — Z1152 Encounter for screening for COVID-19: Secondary | ICD-10-CM | POA: Diagnosis not present

## 2023-05-19 DIAGNOSIS — F17210 Nicotine dependence, cigarettes, uncomplicated: Secondary | ICD-10-CM | POA: Diagnosis present

## 2023-05-19 DIAGNOSIS — Z5900 Homelessness unspecified: Secondary | ICD-10-CM

## 2023-05-19 DIAGNOSIS — E7800 Pure hypercholesterolemia, unspecified: Secondary | ICD-10-CM | POA: Diagnosis present

## 2023-05-19 DIAGNOSIS — Z560 Unemployment, unspecified: Secondary | ICD-10-CM

## 2023-05-19 DIAGNOSIS — Z5941 Food insecurity: Secondary | ICD-10-CM

## 2023-05-19 DIAGNOSIS — I10 Essential (primary) hypertension: Secondary | ICD-10-CM | POA: Diagnosis present

## 2023-05-19 DIAGNOSIS — Z833 Family history of diabetes mellitus: Secondary | ICD-10-CM

## 2023-05-19 LAB — GLUCOSE, CAPILLARY
Glucose-Capillary: 176 mg/dL — ABNORMAL HIGH (ref 70–99)
Glucose-Capillary: 180 mg/dL — ABNORMAL HIGH (ref 70–99)
Glucose-Capillary: 251 mg/dL — ABNORMAL HIGH (ref 70–99)
Glucose-Capillary: 420 mg/dL — ABNORMAL HIGH (ref 70–99)
Glucose-Capillary: 282 mg/dL — ABNORMAL HIGH (ref 70–99)
Glucose-Capillary: 86 mg/dL (ref 70–99)
Glucose-Capillary: 141 mg/dL — ABNORMAL HIGH (ref 70–99)

## 2023-05-19 LAB — POCT URINE DRUG SCREEN - MANUAL ENTRY (I-SCREEN)
POC Amphetamine UR: NOT DETECTED
POC Buprenorphine (BUP): NOT DETECTED
POC Cocaine UR: POSITIVE — AB
POC Marijuana UR: NOT DETECTED
POC Methadone UR: NOT DETECTED
POC Methamphetamine UR: NOT DETECTED
POC Morphine: NOT DETECTED
POC Oxazepam (BZO): NOT DETECTED
POC Oxycodone UR: NOT DETECTED
POC Secobarbital (BAR): NOT DETECTED

## 2023-05-19 LAB — LIPID PANEL
Cholesterol: 172 mg/dL (ref 0–200)
HDL: 45 mg/dL (ref >40)
LDL Cholesterol: 89 mg/dL (ref 0–99)
Total CHOL/HDL Ratio: 3.8 ratio
Triglycerides: 192 mg/dL — ABNORMAL HIGH (ref <150)
VLDL: 38 mg/dL (ref 0–40)

## 2023-05-19 LAB — GLUCOSE, RANDOM: Glucose, Bld: 124 mg/dL — ABNORMAL HIGH (ref 70–99)

## 2023-05-19 LAB — SARS CORONAVIRUS 2 BY RT PCR: SARS Coronavirus 2 by RT PCR: NEGATIVE

## 2023-05-19 LAB — ETHANOL: Alcohol, Ethyl (B): <10 mg/dL (ref <10)

## 2023-05-19 LAB — POC SARS CORONAVIRUS 2 AG: SARSCOV2ONAVIRUS 2 AG: NEGATIVE

## 2023-05-19 MED ORDER — HALOPERIDOL LACTATE 5 MG/ML IJ SOLN: 10.0000 mg | Freq: Three times a day (TID) | INTRAMUSCULAR | Status: DC | PRN

## 2023-05-19 MED ORDER — DIPHENHYDRAMINE HCL 50 MG/ML IJ SOLN: 50.0000 mg | Freq: Three times a day (TID) | INTRAMUSCULAR | Status: DC | PRN

## 2023-05-19 MED ORDER — HALOPERIDOL LACTATE 5 MG/ML IJ SOLN: 5.0000 mg | Freq: Three times a day (TID) | INTRAMUSCULAR | Status: DC | PRN

## 2023-05-19 MED ORDER — INSULIN ASPART 100 UNIT/ML IJ SOLN: 0.0000 [IU] | Freq: Every day | INTRAMUSCULAR | Status: DC

## 2023-05-19 MED ORDER — DULOXETINE HCL 60 MG PO CPEP
60.0000 mg | ORAL_CAPSULE | Freq: Every day | ORAL | Status: DC
  Administered 2023-05-19: 60 mg via ORAL
  Filled 2023-05-19: qty 1

## 2023-05-19 MED ORDER — OLANZAPINE 10 MG IM SOLR: 5.0000 mg | Freq: Three times a day (TID) | INTRAMUSCULAR | Status: DC | PRN

## 2023-05-19 MED ORDER — GABAPENTIN 300 MG PO CAPS: 300.0000 mg | ORAL_CAPSULE | Freq: Every day | ORAL | Status: DC

## 2023-05-19 MED ORDER — NICOTINE 14 MG/24HR TD PT24
14.0000 mg | MEDICATED_PATCH | Freq: Every day | TRANSDERMAL | Status: DC
  Administered 2023-05-19 – 2023-05-28 (×9): 14 mg via TRANSDERMAL
  Filled 2023-05-19 (×9): qty 1

## 2023-05-19 MED ORDER — NAPROXEN 250 MG PO TABS: 250.0000 mg | ORAL_TABLET | Freq: Two times a day (BID) | ORAL | Status: DC | PRN

## 2023-05-19 MED ORDER — METFORMIN HCL 500 MG PO TABS
1000.0000 mg | ORAL_TABLET | Freq: Two times a day (BID) | ORAL | Status: DC
  Administered 2023-05-19 – 2023-05-28 (×18): 1000 mg via ORAL
  Filled 2023-05-19 (×20): qty 2

## 2023-05-19 MED ORDER — HALOPERIDOL 5 MG PO TABS: 5.0000 mg | ORAL_TABLET | Freq: Three times a day (TID) | ORAL | Status: DC | PRN

## 2023-05-19 MED ORDER — INSULIN ASPART 100 UNIT/ML IJ SOLN
0.0000 [IU] | Freq: Three times a day (TID) | INTRAMUSCULAR | Status: DC
  Administered 2023-05-19: 15 [IU] via SUBCUTANEOUS
  Administered 2023-05-20 (×2): 3 [IU] via SUBCUTANEOUS
  Administered 2023-05-21: 2 [IU] via SUBCUTANEOUS
  Administered 2023-05-21: 3 [IU] via SUBCUTANEOUS
  Administered 2023-05-21: 2 [IU] via SUBCUTANEOUS
  Administered 2023-05-22 – 2023-05-23 (×4): 3 [IU] via SUBCUTANEOUS
  Administered 2023-05-23: 8 [IU] via SUBCUTANEOUS
  Administered 2023-05-23: 2 [IU] via SUBCUTANEOUS
  Administered 2023-05-24: 3 [IU] via SUBCUTANEOUS
  Administered 2023-05-24: 5 [IU] via SUBCUTANEOUS
  Administered 2023-05-24: 3 [IU] via SUBCUTANEOUS
  Administered 2023-05-25: 8 [IU] via SUBCUTANEOUS
  Administered 2023-05-25: 3 [IU] via SUBCUTANEOUS
  Administered 2023-05-25: 5 [IU] via SUBCUTANEOUS
  Administered 2023-05-26 – 2023-05-28 (×4): 3 [IU] via SUBCUTANEOUS
  Filled 2023-05-19 (×11): qty 1

## 2023-05-19 MED ORDER — HYDROXYZINE HCL 25 MG PO TABS: 25.0000 mg | ORAL_TABLET | Freq: Three times a day (TID) | ORAL | Status: DC | PRN

## 2023-05-19 MED ORDER — ATORVASTATIN CALCIUM 10 MG PO TABS
40.0000 mg | ORAL_TABLET | Freq: Every day | ORAL | Status: DC
  Administered 2023-05-20 – 2023-05-28 (×9): 40 mg via ORAL
  Filled 2023-05-19 (×9): qty 4

## 2023-05-19 MED ORDER — CYCLOBENZAPRINE HCL 10 MG PO TABS: 10.0000 mg | ORAL_TABLET | Freq: Two times a day (BID) | ORAL | Status: DC | PRN

## 2023-05-19 MED ORDER — HYDROXYZINE HCL 25 MG PO TABS
25.0000 mg | ORAL_TABLET | Freq: Three times a day (TID) | ORAL | Status: DC | PRN
  Administered 2023-05-19 – 2023-05-28 (×2): 25 mg via ORAL
  Filled 2023-05-19 (×2): qty 1

## 2023-05-19 MED ORDER — MAGNESIUM HYDROXIDE 400 MG/5ML PO SUSP: 30.0000 mL | Freq: Every day | ORAL | Status: DC | PRN

## 2023-05-19 MED ORDER — DIPHENHYDRAMINE HCL 50 MG PO CAPS: 50.0000 mg | ORAL_CAPSULE | Freq: Three times a day (TID) | ORAL | Status: DC | PRN

## 2023-05-19 MED ORDER — INSULIN ASPART 100 UNIT/ML IJ SOLN
0.0000 [IU] | Freq: Every day | INTRAMUSCULAR | Status: DC
  Administered 2023-05-20 – 2023-05-23 (×2): 2 [IU] via SUBCUTANEOUS
  Filled 2023-05-19 (×2): qty 1

## 2023-05-19 MED ORDER — AMLODIPINE BESYLATE 5 MG PO TABS
10.0000 mg | ORAL_TABLET | Freq: Every day | ORAL | Status: DC
  Administered 2023-05-20 – 2023-05-28 (×8): 10 mg via ORAL
  Filled 2023-05-19 (×9): qty 2

## 2023-05-19 MED ORDER — GABAPENTIN 300 MG PO CAPS
300.0000 mg | ORAL_CAPSULE | Freq: Every day | ORAL | Status: DC
  Administered 2023-05-19 – 2023-05-27 (×9): 300 mg via ORAL
  Filled 2023-05-19 (×9): qty 1

## 2023-05-19 MED ORDER — NAPROXEN 500 MG PO TABS: 250.0000 mg | ORAL_TABLET | Freq: Two times a day (BID) | ORAL | Status: DC | PRN

## 2023-05-19 MED ORDER — DULOXETINE HCL 60 MG PO CPEP
60.0000 mg | ORAL_CAPSULE | Freq: Every day | ORAL | Status: DC
  Administered 2023-05-20 – 2023-05-28 (×9): 60 mg via ORAL
  Filled 2023-05-19 (×9): qty 1

## 2023-05-19 MED ORDER — AMLODIPINE BESYLATE 10 MG PO TABS
10.0000 mg | ORAL_TABLET | Freq: Every day | ORAL | Status: DC
  Administered 2023-05-19: 10 mg via ORAL
  Filled 2023-05-19: qty 1

## 2023-05-19 MED ORDER — ACETAMINOPHEN 325 MG PO TABS
650.0000 mg | ORAL_TABLET | Freq: Four times a day (QID) | ORAL | Status: DC | PRN
  Administered 2023-05-26: 650 mg via ORAL
  Filled 2023-05-19: qty 2

## 2023-05-19 MED ORDER — ALUM & MAG HYDROXIDE-SIMETH 200-200-20 MG/5ML PO SUSP: 30.0000 mL | ORAL | Status: DC | PRN

## 2023-05-19 MED ORDER — LORAZEPAM 2 MG/ML IJ SOLN: 2.0000 mg | Freq: Three times a day (TID) | INTRAMUSCULAR | Status: DC | PRN

## 2023-05-19 MED ORDER — CLONIDINE HCL 0.1 MG PO TABS
0.1000 mg | ORAL_TABLET | Freq: Once | ORAL | Status: AC
  Administered 2023-05-19: 0.1 mg via ORAL
  Filled 2023-05-19: qty 1

## 2023-05-19 MED ORDER — ACETAMINOPHEN 325 MG PO TABS: 650.0000 mg | ORAL_TABLET | Freq: Four times a day (QID) | ORAL | Status: DC | PRN

## 2023-05-19 MED ORDER — OLANZAPINE 5 MG PO TBDP: 5.0000 mg | ORAL_TABLET | Freq: Three times a day (TID) | ORAL | Status: DC | PRN

## 2023-05-19 MED ORDER — ATORVASTATIN CALCIUM 40 MG PO TABS
40.0000 mg | ORAL_TABLET | Freq: Every day | ORAL | Status: DC
  Administered 2023-05-19: 40 mg via ORAL
  Filled 2023-05-19: qty 1

## 2023-05-19 MED ORDER — METFORMIN HCL 500 MG PO TABS
1000.0000 mg | ORAL_TABLET | Freq: Two times a day (BID) | ORAL | Status: DC
Start: 2023-05-19 — End: 2023-05-19
  Administered 2023-05-19: 1000 mg via ORAL
  Filled 2023-05-19: qty 2

## 2023-05-19 MED ORDER — INSULIN ASPART 100 UNIT/ML IJ SOLN
0.0000 [IU] | Freq: Three times a day (TID) | INTRAMUSCULAR | Status: DC
  Administered 2023-05-19: 3 [IU] via SUBCUTANEOUS
  Administered 2023-05-19: 8 [IU] via SUBCUTANEOUS

## 2023-05-19 NOTE — ED Notes (Signed)
 Patient has been brought on unit familiarized with unit and is now eating, patient is in no acute distress, respirations are even and unlabored, will continue to monitor patient for safety.

## 2023-05-19 NOTE — ED Notes (Signed)
 Spoke with [facility] lab they are going to run the lipid panel off of labs gathered at the ED.  The lab stated if they are unable to run the lab they would call back.

## 2023-05-19 NOTE — Progress Notes (Signed)
 The lab has promised that someone will be on their way to the floor.

## 2023-05-19 NOTE — ED Provider Notes (Signed)
 [facility] Continuous Assessment Admission H&P  Date: 05/19/23 Patient Name: David Wyatt MRN: [id number] Chief Complaint: suicidal ideation  Diagnoses:  Final diagnoses:  Suicidal ideation  Cocaine abuse [facility])  MDD (major depressive disorder), recurrent episode, moderate (HCC)    HPI: David Wyatt 63 year old male presents to [facility] from ED stating "am losing my mind." Patient reports losing his apartment 3 weeks ago and currently is disabled and homeless. Patient endorses substance use of crack cocaine with last use being yesterday. Patient endorse poor sleep, bad appetite with weight loss, poor concentration, Patient denies SI, denies HI Denies AH, endorses VH reporting he sees shadows. Patient appears tired and is dozing off during assessment, not able to get meaningful assessment.  During evaluation David Wyatt is sitting  in no acute distress. He  is alert, oriented x 4, calm, cooperative and attentive.  His mood is euthymic with congruent affect. He  has normal speech, and behavior.  Objectively there is no evidence of psychosis/mania or delusional thinking.  Patient endorses passive suicidal ideation. Patient denies any  homicidal ideation, psychosis, and paranoia.  Patient answered question appropriately.     Ala Dach Warrick-TTS Pt is a 63 year old divorced male who presents unaccompanied to [facility] ED reporting suicidal ideation, cocaine use, and homelessness. Pt says he came to the emergency department, "because I have nowhere to stay." He has a history of chronic homelessness and states he has not had a place to stay for the past 2-3 weeks. He has a documented history of major depressive disorder and history of substance use. He says he smokes $20-60 worth of crack daily when available. He denies other substance use. Pt's drug screen is positive for cocaine and alcohol level is negative.   Patient will be admitted to [facility] for crisis management, safety and stabilization.   Total  Time spent with patient: 20 minutes  Musculoskeletal  Strength & Muscle Tone: within normal limits Gait & Station: normal Patient leans: N/A  Psychiatric Specialty Exam  Presentation General Appearance:  Casual  Eye Contact: Fair  Speech: Clear and Coherent  Speech Volume: Normal  Handedness: Right   Mood and Affect  Mood: Depressed  Affect: Congruent   Thought Process  Thought Processes: Coherent  Descriptions of Associations:Intact  Orientation:Full (Time, Place and Person)  Thought Content:WDL  Diagnosis of Schizophrenia or Schizoaffective disorder in past: No   Hallucinations:Hallucinations: None  Ideas of Reference:None  Suicidal Thoughts:Suicidal Thoughts: No  Homicidal Thoughts:Homicidal Thoughts: No   Sensorium  Memory: Immediate Fair; Recent Fair; Remote Fair  Judgment: Fair  Insight: Fair   Art therapist  Concentration: Fair  Attention Span: Fair  Recall: [employer] of Knowledge: Fair  Language: Fair   Psychomotor Activity  Psychomotor Activity: Psychomotor Activity: Normal   Assets  Assets: Desire for Improvement; Resilience; Leisure Time   Sleep  Sleep: Sleep: Fair Number of Hours of Sleep: 7   Nutritional Assessment (For OBS and FBC admissions only) Has the patient had a weight loss or gain of 10 pounds or more in the last 3 months?: No Has the patient had a decrease in food intake/or appetite?: No Does the patient have dental problems?: No Does the patient have eating habits or behaviors that may be indicators of an eating disorder including binging or inducing vomiting?: No Has the patient recently lost weight without trying?: 0 Has the patient been eating poorly because of a decreased appetite?: 0 Malnutrition Screening Tool Score: 0    Physical Exam HENT:  Head: Normocephalic.     Nose: Nose normal.  Eyes:     Pupils: Pupils are equal, round, and reactive to light.   Cardiovascular:     Rate and Rhythm: Normal rate.     Pulses: Normal pulses.  Abdominal:     General: Abdomen is flat.  Musculoskeletal:        General: Normal range of motion.  Skin:    General: Skin is warm.  Neurological:     Mental Status: He is alert and oriented to person, place, and time.  Psychiatric:        Attention and Perception: Attention normal.        Mood and Affect: Mood normal.        Speech: Speech normal.        Behavior: Behavior normal.        Thought Content: Thought content normal.        Cognition and Memory: Cognition normal.        Judgment: Judgment is impulsive.    Review of Systems  Constitutional: Negative.   HENT: Negative.    Eyes: Negative.   Respiratory: Negative.    Cardiovascular: Negative.   Gastrointestinal: Negative.   Musculoskeletal: Negative.   Skin: Negative.     Blood pressure 126/68, pulse 72, temperature 98.5 F (36.9 C), temperature source Oral, resp. rate 18, SpO2 96%. There is no height or weight on file to calculate BMI.  Past Psychiatric History: [facility] MDD, Cocaine abuse,   Is the patient at risk to self? Yes  Has the patient been a risk to self in the past 6 months? No .    Has the patient been a risk to self within the distant past? No   Is the patient a risk to others? No   Has the patient been a risk to others in the past 6 months? No   Has the patient been a risk to others within the distant past? No   Past Medical History:  Past Medical History:  Diagnosis Date   Angioedema of lips 07/28/2012   left upper (07/29/2012)   Arthritis    hands and back   Chronic back pain    Cocaine abuse (HCC)    Diabetic neuropathy (HCC)    High cholesterol    Hypertension    Neuropathy    Type II diabetes mellitus (HCC)      Family History:  Family History  Problem Relation Age of Onset   Diabetes Mother    Kidney disease Mother    Hyperlipidemia Mother    Hyperlipidemia Father    Diabetes Father       Social History: 63 y/o male with a history of substance abuse, homelessness Social History   Socioeconomic History   Marital status: Divorced    Spouse name: Not on file   Number of children: 3   Years of education: 12   Highest education level: Not on file  Occupational History    Comment: disabled  Tobacco Use   Smoking status: Every Day    Current packs/day: 0.25    Average packs/day: 0.3 packs/day for 30.0 years (7.5 ttl pk-yrs)    Types: Cigarettes   Smokeless tobacco: Former    Quit date: 08/15/2015   Tobacco comments:    04/29/16 2  cigs daily, 10/27/17 sometimes < .25 PPD  Vaping Use   Vaping status: Never Used  Substance and Sexual Activity   Alcohol use: No    Alcohol/week: 0.0 standard  drinks of alcohol   Drug use: Yes    Types: "Crack" cocaine    Comment: when stressed   Sexual activity: Not on file  Other Topics Concern   Not on file  Social History Narrative   04/29/17   Lives in shelter   Caffeine- a lot of  tea, coffee   Social Drivers of Corporate investment banker Strain: Not on file  Food Insecurity: Food Insecurity Present (03/19/2023)   Hunger Vital Sign    Worried About Running Out of Food in the Last Year: Sometimes true    Ran Out of Food in the Last Year: Sometimes true  Transportation Needs: Unmet Transportation Needs (03/19/2023)   PRAPARE - Administrator, Civil Service (Medical): Yes    Lack of Transportation (Non-Medical): Yes  Physical Activity: Not on file  Stress: Not on file  Social Connections: Not on file  Intimate Partner Violence: Not At Risk (06/20/2022)   Humiliation, Afraid, Rape, and Kick questionnaire    Fear of Current or Ex-Partner: No    Emotionally Abused: No    Physically Abused: No    Sexually Abused: No     Last Labs:  Admission on 05/19/2023  Component Date Value Ref Range Status   Cholesterol 05/18/2023 172  0 - 200 mg/dL Final   Triglycerides 16/12/9602 192 (H)  <150 mg/dL Final   HDL 54/11/8117  45  >40 mg/dL Final   Total CHOL/HDL Ratio 05/18/2023 3.8  RATIO Final   VLDL 05/18/2023 38  0 - 40 mg/dL Final   LDL Cholesterol 05/18/2023 89  0 - 99 mg/dL Final   Comment:        Total Cholesterol/HDL:CHD Risk Coronary Heart Disease Risk Table                     Men   Women  1/2 Average Risk   3.4   3.3  Average Risk       5.0   4.4  2 X Average Risk   9.6   7.1  3 X Average Risk  23.4   11.0        Use the calculated Patient Ratio above and the CHD Risk Table to determine the patient's CHD Risk.        ATP III CLASSIFICATION (LDL):  <100     mg/dL   Optimal  147-829  mg/dL   Near or Above                    Optimal  130-159  mg/dL   Borderline  562-130  mg/dL   High  >865     mg/dL   Very High Performed at [facility], [address], [place], [place] [address]   Admission on 05/18/2023, Discharged on 05/19/2023  Component Date Value Ref Range Status   Sodium 05/18/2023 138  135 - 145 mmol/L Final   Potassium 05/18/2023 4.1  3.5 - 5.1 mmol/L Final   Chloride 05/18/2023 105  98 - 111 mmol/L Final   CO2 05/18/2023 24  22 - 32 mmol/L Final   Glucose, Bld 05/18/2023 260 (H)  70 - 99 mg/dL Final   Glucose reference range applies only to samples taken after fasting for at least 8 hours.   BUN 05/18/2023 13  8 - 23 mg/dL Final   Creatinine, Ser 05/18/2023 1.21  0.61 - 1.24 mg/dL Final   Calcium 62/95/2841 8.9  8.9 - 10.3 mg/dL  Final   Total Protein 05/18/2023 6.5  6.5 - 8.1 g/dL Final   Albumin 40/98/1191 3.5  3.5 - 5.0 g/dL Final   AST 47/82/9562 19  15 - 41 U/L Final   ALT 05/18/2023 13  0 - 44 U/L Final   Alkaline Phosphatase 05/18/2023 69  38 - 126 U/L Final   Total Bilirubin 05/18/2023 0.7  0.0 - 1.2 mg/dL Final   GFR, Estimated 05/18/2023 >60  >60 mL/min Final   Comment: (NOTE) Calculated using the CKD-EPI Creatinine Equation (2021)    Anion gap 05/18/2023 9  5 - 15 Final   Performed at [facility], [address] [place], [place] [address]    Alcohol, Ethyl (B) 05/18/2023 <10  <10 mg/dL Final   Comment: (NOTE) Lowest detectable limit for serum alcohol is 10 mg/dL.  For medical purposes only. Performed at [facility], [address], [place], [place] [address]    WBC 05/18/2023 5.9  4.0 - 10.5 K/uL Final   RBC 05/18/2023 4.34  4.22 - 5.81 MIL/uL Final   Hemoglobin 05/18/2023 13.6  13.0 - 17.0 g/dL Final   HCT 96/29/5284 41.5  39.0 - 52.0 % Final   MCV 05/18/2023 95.6  80.0 - 100.0 fL Final   MCH 05/18/2023 31.3  26.0 - 34.0 pg Final   MCHC 05/18/2023 32.8  30.0 - 36.0 g/dL Final   RDW 13/24/4010 13.3  11.5 - 15.5 % Final   Platelets 05/18/2023 455 (H)  150 - 400 K/uL Final   nRBC 05/18/2023 0.0  0.0 - 0.2 % Final   Performed at [facility], [address], [place], [place] [address]   Opiates 05/18/2023 NONE DETECTED  NONE DETECTED Final   Cocaine 05/18/2023 POSITIVE (A)  NONE DETECTED Final   Benzodiazepines 05/18/2023 NONE DETECTED  NONE DETECTED Final   Amphetamines 05/18/2023 NONE DETECTED  NONE DETECTED Final   Tetrahydrocannabinol 05/18/2023 NONE DETECTED  NONE DETECTED Final   Barbiturates 05/18/2023 NONE DETECTED  NONE DETECTED Final   Comment: (NOTE) DRUG SCREEN FOR MEDICAL PURPOSES ONLY.  IF CONFIRMATION IS NEEDED FOR ANY PURPOSE, NOTIFY LAB WITHIN 5 DAYS.  LOWEST DETECTABLE LIMITS FOR URINE DRUG SCREEN Drug Class                     Cutoff (ng/mL) Amphetamine and metabolites    1000 Barbiturate and metabolites    200 Benzodiazepine                 200 Opiates and metabolites        300 Cocaine and metabolites        300 THC                            50 Performed at [facility], [address] [place], [place] [address]    Acetaminophen (Tylenol), Serum 05/18/2023 <10 (L)  10 - 30 ug/mL Final   Comment: (NOTE) Therapeutic concentrations vary significantly. A range of 10-30 ug/mL  may be an effective concentration for many patients. However, some  are best treated at  concentrations outside of this range. Acetaminophen concentrations >150 ug/mL at 4 hours after ingestion  and >50 ug/mL at 12 hours after ingestion are often associated with  toxic reactions.  Performed at [facility], [address], [place], [place] [address]    Salicylate Lvl 05/18/2023 <7.0 (L)  7.0 - 30.0  mg/dL Final   Performed at [facility], [address], [place], [place] [address]   Glucose-Capillary 05/18/2023 223 (H)  70 - 99 mg/dL Final   Glucose reference range applies only to samples taken after fasting for at least 8 hours.   Glucose-Capillary 05/18/2023 213 (H)  70 - 99 mg/dL Final   Glucose reference range applies only to samples taken after fasting for at least 8 hours.  Office Visit on 04/08/2023  Component Date Value Ref Range Status   POC Glucose 04/08/2023 141 (A)  70 - 99 mg/dl Final   QuantiFERON Incubation 04/08/2023 Incubation performed.   Final   QuantiFERON Criteria 04/08/2023 Comment   Final   Comment: QuantiFERON-TB Gold Plus is a qualitative indirect test for M tuberculosis infection (including disease) and is intended for use in conjunction with risk assessment, radiography, and other medical and diagnostic evaluations. The QuantiFERON-TB Gold Plus result is determined by subtracting the Nil value from either TB antigen (Ag) value. The Mitogen tube serves as a control for the test.    QuantiFERON TB1 Ag Value 04/08/2023 0.00  IU/mL Final   QuantiFERON TB2 Ag Value 04/08/2023 0.01  IU/mL Final   QuantiFERON Nil Value 04/08/2023 0.00  IU/mL Final   QuantiFERON Mitogen Value 04/08/2023 >10.00  IU/mL Final   QuantiFERON-TB Gold Plus 04/08/2023 Negative  Negative Final   Comment: No response to M tuberculosis antigens detected. Infection with M tuberculosis is unlikely, but high risk individuals should be considered for additional testing (ATS/IDSA/CDC Clinical Practice Guidelines, 2017). The reference range is an Antigen minus Nil result  of <0.35 IU/mL. Chemiluminescence immunoassay methodology   Congregational Nurse Program on 04/02/2023  Component Date Value Ref Range Status   POC Glucose 04/02/2023 343 (A)  70 - 99 mg/dl Final   POC Glucose 46/96/2952 537 (A)  70 - 99 mg/dl Final  Admission on 84/13/2440, Discharged on 04/01/2023  Component Date Value Ref Range Status   WBC 04/01/2023 5.8  4.0 - 10.5 K/uL Final   RBC 04/01/2023 3.96 (L)  4.22 - 5.81 MIL/uL Final   Hemoglobin 04/01/2023 12.4 (L)  13.0 - 17.0 g/dL Final   HCT 01/05/2535 38.0 (L)  39.0 - 52.0 % Final   MCV 04/01/2023 96.0  80.0 - 100.0 fL Final   MCH 04/01/2023 31.3  26.0 - 34.0 pg Final   MCHC 04/01/2023 32.6  30.0 - 36.0 g/dL Final   RDW 64/40/3474 13.6  11.5 - 15.5 % Final   Platelets 04/01/2023 325  150 - 400 K/uL Final   nRBC 04/01/2023 0.0  0.0 - 0.2 % Final   Neutrophils Relative % 04/01/2023 65  % Final   Neutro Abs 04/01/2023 3.8  1.7 - 7.7 K/uL Final   Lymphocytes Relative 04/01/2023 25  % Final   Lymphs Abs 04/01/2023 1.4  0.7 - 4.0 K/uL Final   Monocytes Relative 04/01/2023 5  % Final   Monocytes Absolute 04/01/2023 0.3  0.1 - 1.0 K/uL Final   Eosinophils Relative 04/01/2023 4  % Final   Eosinophils Absolute 04/01/2023 0.2  0.0 - 0.5 K/uL Final   Basophils Relative 04/01/2023 1  % Final   Basophils Absolute 04/01/2023 0.0  0.0 - 0.1 K/uL Final   Immature Granulocytes 04/01/2023 0  % Final   Abs Immature Granulocytes 04/01/2023 0.02  0.00 - 0.07 K/uL Final   Performed at [facility], [address], [place], [place] [address]   Sodium 04/01/2023 133 (L)  135 -  145 mmol/L Final   Potassium 04/01/2023 3.8  3.5 - 5.1 mmol/L Final   Chloride 04/01/2023 99  98 - 111 mmol/L Final   CO2 04/01/2023 23  22 - 32 mmol/L Final   Glucose, Bld 04/01/2023 575 (HH)  70 - 99 mg/dL Final   Comment: CRITICAL RESULT CALLED TO, READ BACK BY AND VERIFIED WITH BLAIR,I. RN AT 1415 04/01/23 MULLINS,T Glucose reference range applies only  to samples taken after fasting for at least 8 hours.    BUN 04/01/2023 11  8 - 23 mg/dL Final   Creatinine, Ser 04/01/2023 1.05  0.61 - 1.24 mg/dL Final   Calcium 40/98/1191 8.7 (L)  8.9 - 10.3 mg/dL Final   Total Protein 47/82/9562 7.0  6.5 - 8.1 g/dL Final   Albumin 13/10/6576 3.5  3.5 - 5.0 g/dL Final   AST 46/96/2952 19  15 - 41 U/L Final   ALT 04/01/2023 7  0 - 44 U/L Final   Alkaline Phosphatase 04/01/2023 86  38 - 126 U/L Final   Total Bilirubin 04/01/2023 0.6  0.0 - 1.2 mg/dL Final   GFR, Estimated 04/01/2023 >60  >60 mL/min Final   Comment: (NOTE) Calculated using the CKD-EPI Creatinine Equation (2021)    Anion gap 04/01/2023 11  5 - 15 Final   Performed at [facility], [address] [place], [place] [address]   Glucose-Capillary 04/01/2023 208 (H)  70 - 99 mg/dL Final   Glucose reference range applies only to samples taken after fasting for at least 8 hours.  Congregational Nurse Program on 04/01/2023  Component Date Value Ref Range Status   POC Glucose 04/01/2023 469 (A)  70 - 99 mg/dl Final  Office Visit on 02/20/2023  Component Date Value Ref Range Status   HbA1c, POC (controlled diabetic ra* 02/20/2023 7.6 (A)  0.0 - 7.0 % Final   HCV Ab 02/20/2023 Reactive (A)  Non Reactive Final   Creatinine, Urine 02/20/2023 378.6  Not Estab. mg/dL Final   Microalbumin, Urine 02/20/2023 19.4  Not Estab. ug/mL Final   Microalb/Creat Ratio 02/20/2023 5  0 - 29 mg/g creat Final   Comment:                        Normal:                0 -  29                        Moderately increased: 30 - 300                        Severely increased:       >300    Hepatitis C Quantitation 02/20/2023 HCV Not Detected  IU/mL Final   Test Information (HCV): 02/20/2023 Comment   Final   The quantitative range of this assay is 15 IU/mL to 100 million IU/mL.   Interpretation (HCV): 02/20/2023 Comment   Final   Comment: Positive HCV antibody screen without the presence of HCV  RNA is consistent with a resolved past infection or a false positive HCV antibody. Consider repeat testing after one month.   Admission on 12/25/2022, Discharged on 12/25/2022  Component Date Value Ref Range Status   Glucose-Capillary 12/25/2022 83  70 - 99 mg/dL Final   Glucose reference range applies only to samples taken after fasting for at least 8 hours.  Admission on 12/05/2022, Discharged  on 12/05/2022  Component Date Value Ref Range Status   Glucose-Capillary 12/05/2022 116 (H)  70 - 99 mg/dL Final   Glucose reference range applies only to samples taken after fasting for at least 8 hours.   Comment 1 12/05/2022 Notify RN   Final   Comment 2 12/05/2022 Document in Chart   Final   Glucose-Capillary 12/05/2022 125 (H)  70 - 99 mg/dL Final   Glucose reference range applies only to samples taken after fasting for at least 8 hours.  Hospital Outpatient Visit on 12/02/2022  Component Date Value Ref Range Status   Hgb A1c MFr Bld 12/02/2022 12.4 (H)  4.8 - 5.6 % Final   Comment: (NOTE) Pre diabetes:          5.7%-6.4%  Diabetes:              >6.4%  Glycemic control for   <7.0% adults with diabetes    Mean Plasma Glucose 12/02/2022 309.18  mg/dL Final   Performed at [facility], [address], [place], [place] [address]   Sodium 12/02/2022 138  135 - 145 mmol/L Final   Potassium 12/02/2022 4.1  3.5 - 5.1 mmol/L Final   Chloride 12/02/2022 106  98 - 111 mmol/L Final   CO2 12/02/2022 23  22 - 32 mmol/L Final   Glucose, Bld 12/02/2022 211 (H)  70 - 99 mg/dL Final   Glucose reference range applies only to samples taken after fasting for at least 8 hours.   BUN 12/02/2022 26 (H)  8 - 23 mg/dL Final   Creatinine, Ser 12/02/2022 1.19  0.61 - 1.24 mg/dL Final   Calcium 60/45/4098 9.4  8.9 - 10.3 mg/dL Final   GFR, Estimated 12/02/2022 >60  >60 mL/min Final   Comment: (NOTE) Calculated using the CKD-EPI Creatinine Equation (2021)    Anion gap 12/02/2022 9  5 - 15 Final    Performed at [facility], [address] [place], [place] [address]   WBC 12/02/2022 5.8  4.0 - 10.5 K/uL Final   RBC 12/02/2022 4.53  4.22 - 5.81 MIL/uL Final   Hemoglobin 12/02/2022 13.5  13.0 - 17.0 g/dL Final   HCT 78/29/5621 42.8  39.0 - 52.0 % Final   MCV 12/02/2022 94.5  80.0 - 100.0 fL Final   MCH 12/02/2022 29.8  26.0 - 34.0 pg Final   MCHC 12/02/2022 31.5  30.0 - 36.0 g/dL Final   RDW 30/86/5784 13.7  11.5 - 15.5 % Final   Platelets 12/02/2022 351  150 - 400 K/uL Final   nRBC 12/02/2022 0.0  0.0 - 0.2 % Final   Performed at [facility], [address], [place], [place] [address]   Glucose-Capillary 12/02/2022 226 (H)  70 - 99 mg/dL Final   Glucose reference range applies only to samples taken after fasting for at least 8 hours.  There may be more visits with results that are not included.    Allergies: Lisinopril  Medications:  Facility Ordered Medications  Medication   acetaminophen (TYLENOL) tablet 650 mg   alum & mag hydroxide-simeth (MAALOX/MYLANTA) 200-200-20 MG/5ML suspension 30 mL   magnesium hydroxide (MILK OF MAGNESIA) suspension 30 mL   haloperidol (HALDOL) tablet 5 mg   And   diphenhydrAMINE (BENADRYL) capsule 50 mg   haloperidol lactate (HALDOL) injection 5 mg   And   diphenhydrAMINE (BENADRYL) injection 50 mg   And   LORazepam (ATIVAN) injection 2 mg   haloperidol lactate (HALDOL)  injection 10 mg   And   diphenhydrAMINE (BENADRYL) injection 50 mg   And   LORazepam (ATIVAN) injection 2 mg   hydrOXYzine (ATARAX) tablet 25 mg   amLODipine (NORVASC) tablet 10 mg   atorvastatin (LIPITOR) tablet 40 mg   cyclobenzaprine (FLEXERIL) tablet 10 mg   DULoxetine (CYMBALTA) DR capsule 60 mg   gabapentin (NEURONTIN) capsule 300 mg   metFORMIN (GLUCOPHAGE) tablet 1,000 mg   naproxen (NAPROSYN) tablet 250 mg   insulin aspart (novoLOG) injection 0-15 Units   insulin aspart (novoLOG) injection 0-5 Units   PTA Medications   Medication Sig   famotidine (PEPCID) 10 MG tablet Take 1 tablet (10 mg total) by mouth 2 (two) times daily. (Patient not taking: Reported on 12/03/2022)   glipiZIDE (GLUCOTROL XL) 10 MG 24 hr tablet Take 1 tablet (10 mg total) by mouth daily with breakfast.   traZODone (DESYREL) 100 MG tablet Take 1 tablet (100 mg total) by mouth at bedtime as needed for sleep. (Patient taking differently: Take 100 mg by mouth at bedtime.)   atorvastatin (LIPITOR) 40 MG tablet Take 1 tablet (40 mg total) by mouth daily.   DULoxetine (CYMBALTA) 60 MG capsule Take 1 capsule (60 mg total) by mouth daily.   acetaminophen (TYLENOL) 325 MG tablet Take 650 mg by mouth every 6 (six) hours as needed for mild pain or moderate pain.   Menthol, Topical Analgesic, 4 % GEL Apply 1 application  topically every 2 (two) hours as needed (Apply to painful muscle joints).   cholecalciferol (VITAMIN D3) 25 MCG (1000 UNIT) tablet Take 1,000 Units by mouth daily.   polyethylene glycol (MIRALAX / GLYCOLAX) 17 g packet Take 17 g by mouth 2 (two) times daily as needed for moderate constipation or mild constipation.   naproxen sodium (ALEVE) 220 MG tablet Take 220 mg by mouth 2 (two) times daily as needed (Pain).   nicotine polacrilex (NICORETTE) 4 MG gum Take 4 mg by mouth daily as needed for smoking cessation (between the gum and the cheek every 1-2 hours as needed for cigarette craving).   Dulaglutide (TRULICITY) 1.5 MG/0.5ML SOPN Inject 1.5 mg into the skin once a week.   hyoscyamine (ANASPAZ) 0.125 MG TBDP disintergrating tablet Place 1 tablet (0.125 mg total) under the tongue every 6 (six) hours as needed for up to 10 doses for bladder spasms.   tamsulosin (FLOMAX) 0.4 MG CAPS capsule Take 1 capsule (0.4 mg total) by mouth daily.   cyclobenzaprine (FLEXERIL) 10 MG tablet Take 1 tablet (10 mg total) by mouth 2 (two) times daily as needed for muscle spasms.   lidocaine (LIDODERM) 5 % Place 1 patch onto the skin daily. Remove & Discard  patch within 12 hours or as directed by MD   gabapentin (NEURONTIN) 300 MG capsule Take 1 capsule (300 mg total) by mouth at bedtime.   nicotine (NICODERM CQ) 7 mg/24hr patch Place 1 patch (7 mg total) onto the skin daily.   insulin aspart (NOVOLOG) 100 UNIT/ML FlexPen Inject 0-9 Units into the skin 3 (three) times daily with meals.   metFORMIN (GLUCOPHAGE) 1000 MG tablet Take 1 tablet (1,000 mg total) by mouth 2 (two) times daily with a meal.   amLODipine (NORVASC) 10 MG tablet Take 1 tablet (10 mg total) by mouth daily.   Insulin Pen Needle (COMFORT EZ PEN NEEDLES) 31G X 6 MM MISC use to inject insulin 3 times daily   glucose blood (ACCU-CHEK GUIDE TEST) test strip Use to check blood sugar  3 times daily.   Blood Glucose Monitoring Suppl (ACCU-CHEK GUIDE) w/Device KIT Use to check blood sugar 3 times daily.   Accu-Chek Softclix Lancets lancets Use to check blood sugar 3 times daily.      Medical Decision Making  Pt is a 63 year old divorced male who presents unaccompanied to [facility] ED reporting suicidal ideation, cocaine use, and homelessness. Pt says he came to the emergency department, "because I have nowhere to stay." He has a history of chronic homelessness and states he has not had a place to stay for the past 2-3 weeks. He has a documented history of major depressive disorder and history of substance use. He says he smokes $20-60 worth of crack daily when available. He denies other substance use. Pt's drug screen is positive for cocaine and alcohol level is negative.     Recommendations  Based on my evaluation the patient does not appear to have an emergency medical condition. Patient will be admitted to [facility] for crisis management, safety and stabilization.  Jasper Riling, NP 05/19/23  7:00 AM

## 2023-05-19 NOTE — Progress Notes (Signed)
 Writer spoke to a tech in the lab to ask for a final ETA since the initial call for STAT lab was placed at 1700.

## 2023-05-19 NOTE — Progress Notes (Signed)
 Pt was accepted to [facility] 05/19/2023 Bed Assignment: L 31   Address: [address] [place], [place], [place] [address]  [facility] Fax: [phone]  Pt meets inpatient criteria per: Wendi Maya NP  Attending Physician will be Dr. Callie Fielding  Report can be called to: [phone]  Pt can arrive after Pending neg Covid    Care Team notified:Linsey Strader RN, Fonnie Jarvis RN, Wendi Maya NP, Jeoffrey Massed RN,    [place] Taralyn Ferraiolo, MSW, [facility] 05/19/2023 10:50 AM

## 2023-05-19 NOTE — ED Provider Notes (Signed)
 FBC/OBS ASAP Discharge Summary  Date and Time: 05/19/2023 1:00 PM  Name: David Wyatt  MRN:  [id number]   Discharge Diagnoses:  Final diagnoses:  Suicidal ideation  Cocaine abuse (HCC)  MDD (major depressive disorder), recurrent episode, moderate (HCC)    Subjective: David Wyatt 63 year old male presents to [facility] from ED stating "am losing my mind." Patient reports losing his apartment 3 weeks ago and currently is disabled and homeless. Patient endorses substance use of crack cocaine with last use being yesterday. Patient endorse poor sleep, bad appetite with weight loss, poor concentration, Patient denies SI, denies HI Denies AH, endorses VH reporting he sees shadows. Patient appears tired and is dozing off during assessment, not able to get meaningful assessment.   During evaluation David Wyatt is sitting  in no acute distress. He  is alert, oriented x 4, calm, cooperative and attentive.  His mood is euthymic with congruent affect. He  has normal speech, and behavior.  Objectively there is no evidence of psychosis/mania or delusional thinking.  Patient endorses passive suicidal ideation. Patient denies any  homicidal ideation, psychosis, and paranoia.  Patient was irritated by a patient that was yelling it was difficult for the patient to express himself and he was not able to hear my questions. Patient stated that if he were discharged  "I will kill myself" . Patient agreed that going to [facility] was going to be "good for me, I don't have any place to go , I lost my apartment. " Patient has suicidal ideation  Stay Summary: Patient has been cooperative since his stay, however there is another patient on the unit that is yelling, this is causing the patient to become irritable.   Total Time spent with patient: 30 minutes  Past Psychiatric History: substance abuse Past Medical History: Past Medical History: 07/28/2012: Angioedema of lips     Comment:  left upper (07/29/2012) No date: Arthritis      Comment:  hands and back No date: Chronic back pain No date: Cocaine abuse (HCC) No date: Diabetic neuropathy (HCC) No date: High cholesterol No date: Hypertension No date: Neuropathy No date: Type II diabetes mellitus (HCC)  Family History: unknown Family Psychiatric History: unknown Social History: Patient is divorced and recently evicted form his apartment. Patient had recent back surgery and uses a walker to walk however hi is steady on his feet with the walker.  Tobacco Cessation:  Prescription not provided because: being transferred to [facility]  Current Medications:  Current Facility-Administered Medications  Medication Dose Route Frequency Provider Last Rate Last Admin   acetaminophen (TYLENOL) tablet 650 mg  650 mg Oral Q6H PRN Bobbitt, Shalon E, NP       alum & mag hydroxide-simeth (MAALOX/MYLANTA) 200-200-20 MG/5ML suspension 30 mL  30 mL Oral Q4H PRN Bobbitt, Shalon E, NP       amLODipine (NORVASC) tablet 10 mg  10 mg Oral Daily Bobbitt, Shalon E, NP   10 mg at 05/19/23 0819   atorvastatin (LIPITOR) tablet 40 mg  40 mg Oral Daily Bobbitt, Shalon E, NP   40 mg at 05/19/23 6962   cyclobenzaprine (FLEXERIL) tablet 10 mg  10 mg Oral BID PRN Bobbitt, Shalon E, NP       haloperidol (HALDOL) tablet 5 mg  5 mg Oral TID PRN Bobbitt, Shalon E, NP       And   diphenhydrAMINE (BENADRYL) capsule 50 mg  50 mg Oral TID PRN Bobbitt, Shalon E, NP       haloperidol lactate (  HALDOL) injection 5 mg  5 mg Intramuscular TID PRN Bobbitt, Shalon E, NP       And   diphenhydrAMINE (BENADRYL) injection 50 mg  50 mg Intramuscular TID PRN Bobbitt, Shalon E, NP       And   LORazepam (ATIVAN) injection 2 mg  2 mg Intramuscular TID PRN Bobbitt, Shalon E, NP       haloperidol lactate (HALDOL) injection 10 mg  10 mg Intramuscular TID PRN Bobbitt, Shalon E, NP       And   diphenhydrAMINE (BENADRYL) injection 50 mg  50 mg Intramuscular TID PRN Bobbitt, Shalon E, NP       And   LORazepam (ATIVAN) injection 2 mg   2 mg Intramuscular TID PRN Bobbitt, Shalon E, NP       DULoxetine (CYMBALTA) DR capsule 60 mg  60 mg Oral Daily Bobbitt, Shalon E, NP   60 mg at 05/19/23 0820   gabapentin (NEURONTIN) capsule 300 mg  300 mg Oral QHS Bobbitt, Shalon E, NP       hydrOXYzine (ATARAX) tablet 25 mg  25 mg Oral TID PRN Bobbitt, Shalon E, NP       insulin aspart (novoLOG) injection 0-15 Units  0-15 Units Subcutaneous TID WC Bobbitt, Shalon E, NP   3 Units at 05/19/23 0824   insulin aspart (novoLOG) injection 0-5 Units  0-5 Units Subcutaneous QHS Bobbitt, Shalon E, NP       magnesium hydroxide (MILK OF MAGNESIA) suspension 30 mL  30 mL Oral Daily PRN Bobbitt, Shalon E, NP       metFORMIN (GLUCOPHAGE) tablet 1,000 mg  1,000 mg Oral BID WC Bobbitt, Shalon E, NP   1,000 mg at 05/19/23 0819   naproxen (NAPROSYN) tablet 250 mg  250 mg Oral BID PRN Bobbitt, Shalon E, NP       Current Outpatient Medications  Medication Sig Dispense Refill   Accu-Chek Softclix Lancets lancets Use to check blood sugar 3 times daily. 100 each 6   acetaminophen (TYLENOL) 325 MG tablet Take 650 mg by mouth every 6 (six) hours as needed for mild pain or moderate pain.     amLODipine (NORVASC) 10 MG tablet Take 1 tablet (10 mg total) by mouth daily. 90 tablet 1   atorvastatin (LIPITOR) 40 MG tablet Take 1 tablet (40 mg total) by mouth daily. 30 tablet 4   Blood Glucose Monitoring Suppl (ACCU-CHEK GUIDE) w/Device KIT Use to check blood sugar 3 times daily. 1 kit 0   cholecalciferol (VITAMIN D3) 25 MCG (1000 UNIT) tablet Take 1,000 Units by mouth daily.     cyclobenzaprine (FLEXERIL) 10 MG tablet Take 1 tablet (10 mg total) by mouth 2 (two) times daily as needed for muscle spasms. 20 tablet 0   Dulaglutide (TRULICITY) 1.5 MG/0.5ML SOPN Inject 1.5 mg into the skin once a week.     DULoxetine (CYMBALTA) 60 MG capsule Take 1 capsule (60 mg total) by mouth daily. 90 capsule 1   famotidine (PEPCID) 10 MG tablet Take 1 tablet (10 mg total) by mouth 2 (two)  times daily. (Patient not taking: Reported on 12/03/2022) 60 tablet 3   gabapentin (NEURONTIN) 300 MG capsule Take 1 capsule (300 mg total) by mouth at bedtime. 90 capsule 1   glipiZIDE (GLUCOTROL XL) 10 MG 24 hr tablet Take 1 tablet (10 mg total) by mouth daily with breakfast. 30 tablet 3   glucose blood (ACCU-CHEK GUIDE TEST) test strip Use to check blood sugar 3 times  daily. 100 each 6   hyoscyamine (ANASPAZ) 0.125 MG TBDP disintergrating tablet Place 1 tablet (0.125 mg total) under the tongue every 6 (six) hours as needed for up to 10 doses for bladder spasms. 10 tablet 0   insulin aspart (NOVOLOG) 100 UNIT/ML FlexPen Inject 0-9 Units into the skin 3 (three) times daily with meals. 30 mL 1   Insulin Pen Needle (COMFORT EZ PEN NEEDLES) 31G X 6 MM MISC use to inject insulin 3 times daily 100 each 1   lidocaine (LIDODERM) 5 % Place 1 patch onto the skin daily. Remove & Discard patch within 12 hours or as directed by MD 30 patch 0   Menthol, Topical Analgesic, 4 % GEL Apply 1 application  topically every 2 (two) hours as needed (Apply to painful muscle joints).     metFORMIN (GLUCOPHAGE) 1000 MG tablet Take 1 tablet (1,000 mg total) by mouth 2 (two) times daily with a meal. 60 tablet 4   naproxen sodium (ALEVE) 220 MG tablet Take 220 mg by mouth 2 (two) times daily as needed (Pain).     nicotine (NICODERM CQ) 7 mg/24hr patch Place 1 patch (7 mg total) onto the skin daily. 28 patch 3   nicotine polacrilex (NICORETTE) 4 MG gum Take 4 mg by mouth daily as needed for smoking cessation (between the gum and the cheek every 1-2 hours as needed for cigarette craving).     polyethylene glycol (MIRALAX / GLYCOLAX) 17 g packet Take 17 g by mouth 2 (two) times daily as needed for moderate constipation or mild constipation.     tamsulosin (FLOMAX) 0.4 MG CAPS capsule Take 1 capsule (0.4 mg total) by mouth daily. 30 capsule 8   traZODone (DESYREL) 100 MG tablet Take 1 tablet (100 mg total) by mouth at bedtime as  needed for sleep. (Patient taking differently: Take 100 mg by mouth at bedtime.) 30 tablet 3    PTA Medications:  Facility Ordered Medications  Medication   acetaminophen (TYLENOL) tablet 650 mg   alum & mag hydroxide-simeth (MAALOX/MYLANTA) 200-200-20 MG/5ML suspension 30 mL   magnesium hydroxide (MILK OF MAGNESIA) suspension 30 mL   haloperidol (HALDOL) tablet 5 mg   And   diphenhydrAMINE (BENADRYL) capsule 50 mg   haloperidol lactate (HALDOL) injection 5 mg   And   diphenhydrAMINE (BENADRYL) injection 50 mg   And   LORazepam (ATIVAN) injection 2 mg   haloperidol lactate (HALDOL) injection 10 mg   And   diphenhydrAMINE (BENADRYL) injection 50 mg   And   LORazepam (ATIVAN) injection 2 mg   hydrOXYzine (ATARAX) tablet 25 mg   amLODipine (NORVASC) tablet 10 mg   atorvastatin (LIPITOR) tablet 40 mg   cyclobenzaprine (FLEXERIL) tablet 10 mg   DULoxetine (CYMBALTA) DR capsule 60 mg   gabapentin (NEURONTIN) capsule 300 mg   metFORMIN (GLUCOPHAGE) tablet 1,000 mg   naproxen (NAPROSYN) tablet 250 mg   insulin aspart (novoLOG) injection 0-15 Units   insulin aspart (novoLOG) injection 0-5 Units   PTA Medications  Medication Sig   famotidine (PEPCID) 10 MG tablet Take 1 tablet (10 mg total) by mouth 2 (two) times daily. (Patient not taking: Reported on 12/03/2022)   glipiZIDE (GLUCOTROL XL) 10 MG 24 hr tablet Take 1 tablet (10 mg total) by mouth daily with breakfast.   traZODone (DESYREL) 100 MG tablet Take 1 tablet (100 mg total) by mouth at bedtime as needed for sleep. (Patient taking differently: Take 100 mg by mouth at bedtime.)   atorvastatin (  LIPITOR) 40 MG tablet Take 1 tablet (40 mg total) by mouth daily.   DULoxetine (CYMBALTA) 60 MG capsule Take 1 capsule (60 mg total) by mouth daily.   acetaminophen (TYLENOL) 325 MG tablet Take 650 mg by mouth every 6 (six) hours as needed for mild pain or moderate pain.   Menthol, Topical Analgesic, 4 % GEL Apply 1 application  topically  every 2 (two) hours as needed (Apply to painful muscle joints).   cholecalciferol (VITAMIN D3) 25 MCG (1000 UNIT) tablet Take 1,000 Units by mouth daily.   polyethylene glycol (MIRALAX / GLYCOLAX) 17 g packet Take 17 g by mouth 2 (two) times daily as needed for moderate constipation or mild constipation.   naproxen sodium (ALEVE) 220 MG tablet Take 220 mg by mouth 2 (two) times daily as needed (Pain).   nicotine polacrilex (NICORETTE) 4 MG gum Take 4 mg by mouth daily as needed for smoking cessation (between the gum and the cheek every 1-2 hours as needed for cigarette craving).   Dulaglutide (TRULICITY) 1.5 MG/0.5ML SOPN Inject 1.5 mg into the skin once a week.   hyoscyamine (ANASPAZ) 0.125 MG TBDP disintergrating tablet Place 1 tablet (0.125 mg total) under the tongue every 6 (six) hours as needed for up to 10 doses for bladder spasms.   tamsulosin (FLOMAX) 0.4 MG CAPS capsule Take 1 capsule (0.4 mg total) by mouth daily.   cyclobenzaprine (FLEXERIL) 10 MG tablet Take 1 tablet (10 mg total) by mouth 2 (two) times daily as needed for muscle spasms.   lidocaine (LIDODERM) 5 % Place 1 patch onto the skin daily. Remove & Discard patch within 12 hours or as directed by MD   gabapentin (NEURONTIN) 300 MG capsule Take 1 capsule (300 mg total) by mouth at bedtime.   nicotine (NICODERM CQ) 7 mg/24hr patch Place 1 patch (7 mg total) onto the skin daily.   insulin aspart (NOVOLOG) 100 UNIT/ML FlexPen Inject 0-9 Units into the skin 3 (three) times daily with meals.   metFORMIN (GLUCOPHAGE) 1000 MG tablet Take 1 tablet (1,000 mg total) by mouth 2 (two) times daily with a meal.   amLODipine (NORVASC) 10 MG tablet Take 1 tablet (10 mg total) by mouth daily.   Insulin Pen Needle (COMFORT EZ PEN NEEDLES) 31G X 6 MM MISC use to inject insulin 3 times daily   glucose blood (ACCU-CHEK GUIDE TEST) test strip Use to check blood sugar 3 times daily.   Blood Glucose Monitoring Suppl (ACCU-CHEK GUIDE) w/Device KIT Use to  check blood sugar 3 times daily.   Accu-Chek Softclix Lancets lancets Use to check blood sugar 3 times daily.       09/17/2022    3:23 PM 07/25/2022   11:16 AM 07/03/2022    4:42 PM  Depression screen PHQ 2/9  Decreased Interest 0 0 2  Down, Depressed, Hopeless 0 0 3  PHQ - 2 Score 0 0 5  Altered sleeping 0 2 3  Tired, decreased energy 0 0 3  Change in appetite 0 0 3  Feeling bad or failure about yourself  0 1 3  Trouble concentrating 0 1 3  Moving slowly or fidgety/restless 0 1 3  Suicidal thoughts 0 1 3  PHQ-9 Score 0 6 26  Difficult doing work/chores Not difficult at all  Extremely dIfficult    Flowsheet Row ED from 05/19/2023 in [facility] ED from 05/18/2023 in [facility] Department at [facility] ED from 04/01/2023 in [facility]  C-SSRS RISK CATEGORY Low Risk Low Risk No Risk       Musculoskeletal  Strength & Muscle Tone: decreased Gait & Station:  Patient walks with a walker but is steady Patient leans:  patient uses a walker to stand  Psychiatric Specialty Exam  Presentation  General Appearance:  Casual  Eye Contact: Fair  Speech: Clear and Coherent  Speech Volume: Normal  Handedness: Right   Mood and Affect  Mood: Depressed; Irritable  Affect: Congruent   Thought Process  Thought Processes: Coherent  Descriptions of Associations:Intact  Orientation:Full (Time, Place and Person)  Thought Content:WDL  Diagnosis of Schizophrenia or Schizoaffective disorder in past: No    Hallucinations:Hallucinations: None  Ideas of Reference:None  Suicidal Thoughts:Suicidal Thoughts: No  Homicidal Thoughts:Homicidal Thoughts: No   Sensorium  Memory: Immediate Fair; Recent Fair; Remote Fair  Judgment: Fair  Insight: Fair   Art therapist  Concentration: Fair  Attention Span: Fair  Recall: Fair  Fund of  Knowledge: Fair  Language: Fair   Psychomotor Activity  Psychomotor Activity: Psychomotor Activity: Normal   Assets  Assets: Desire for Improvement; Leisure Time   Sleep  Sleep: Sleep: Fair Number of Hours of Sleep: 7   Nutritional Assessment (For OBS and FBC admissions only) Has the patient had a weight loss or gain of 10 pounds or more in the last 3 months?: No Has the patient had a decrease in food intake/or appetite?: No Does the patient have dental problems?: No Does the patient have eating habits or behaviors that may be indicators of an eating disorder including binging or inducing vomiting?: No Has the patient recently lost weight without trying?: 0 Has the patient been eating poorly because of a decreased appetite?: 0 Malnutrition Screening Tool Score: 0    Physical Exam  Physical Exam Constitutional:      Appearance: Normal appearance.  HENT:     Head: Normocephalic.  Cardiovascular:     Rate and Rhythm: Normal rate.  Pulmonary:     Effort: Pulmonary effort is normal.  Abdominal:     General: Abdomen is flat.  Musculoskeletal:     Cervical back: Normal range of motion.  Skin:    General: Skin is warm.  Neurological:     Mental Status: He is alert.  Psychiatric:        Thought Content: Thought content normal.  Review of Systems  Constitutional: Negative.   HENT: Negative.    Respiratory: Negative.    Cardiovascular: Negative.   Genitourinary: Negative.   Musculoskeletal:  Positive for back pain.  Skin: Negative.   Neurological: Negative.   Psychiatric/Behavioral: Negative.    Blood pressure 126/68, pulse 72, temperature 98.5 F (36.9 C), resp. rate 18, SpO2 96%. There is no height or weight on file to calculate BMI.  Disposition:Patient recommended to inpatient. Patient will be transferred to [facility] unit via safe transport.  Patient agreed to go to this facility. Patient seems to be upset by another patient on the unit that is  yelling and uncooperative. Patient believes that the transfer will allow him to get appropriate psychiatric treatment.   Donato Heinz, NP 05/19/2023, 1:00 PM

## 2023-05-19 NOTE — Plan of Care (Signed)
  Problem: Education: Goal: Utilization of techniques to improve thought processes will improve Outcome: Not Progressing Goal: Knowledge of the prescribed therapeutic regimen will improve Outcome: Not Progressing   Problem: Education: Goal: Ability to state activities that reduce stress will improve Outcome: Not Progressing   Problem: Coping: Goal: Ability to identify and develop effective coping behavior will improve Outcome: Not Progressing

## 2023-05-19 NOTE — ED Notes (Signed)
 Patient is alert and oriented. He denies SI/HI or AVH. Patient has been calm and cooperative. Medicines taken without issues. Some paranoia noticed when patient asked "why is she looking at me". Reassurance provided. We will continue to monitor for safety.

## 2023-05-19 NOTE — ED Notes (Signed)
 Report given to Amy, RN.

## 2023-05-19 NOTE — Tx Team (Signed)
 Initial Treatment Plan 05/19/2023 5:08 PM David Wyatt [id number]    PATIENT STRESSORS: Financial difficulties   Substance abuse     PATIENT STRENGTHS: Communication skills    PATIENT IDENTIFIED PROBLEMS:   "I am homeless"  "I have had recent back surgeries."                 DISCHARGE CRITERIA:  Ability to meet basic life and health needs Adequate post-discharge living arrangements Improved stabilization in mood, thinking, and/or behavior Safe-care adequate arrangements made  PRELIMINARY DISCHARGE PLAN: Attend aftercare/continuing care group Placement in alternative living arrangements  PATIENT/FAMILY INVOLVEMENT: This treatment plan has been presented to and reviewed with the patient, David Wyatt. The patient has been given the opportunity to ask questions and make suggestions.   Luane School, RN 05/19/2023, 5:08 PM

## 2023-05-19 NOTE — Progress Notes (Signed)
 Lab notified that a STAT lab verification is needed for a blood glucose of >400. 2nd call made at 1720 to obtain ETA.

## 2023-05-19 NOTE — ED Notes (Signed)
 Safe Transport called to transport pt to [facility].

## 2023-05-19 NOTE — Progress Notes (Signed)
 Patient is a 63 year old male admitted voluntarily to the [facility] floor from [facility] ED with a diagnosis of major depressive disorder. Patient made SI statements and suffers from chronic homelessness. "I am stressed about being homeless. If I got discharged from the other place, I was going to kill myself." Patient presents to assessment via his personal rollator walker. He is given a front wheel walker. He is A+O x4. He currently denies SI/HI/AVH. Patient's affect is appropriate and speech is logical and coherent. Patient endorses depression and anxiety 10/10. Denies pain. Denies the use of eyeglasses or hearing aid. Reports that his dentures are in a storage room somewhere. Reports last BM was 2 days ago.  Patient reports substance use of crack cocaine. Cites his Child psychotherapist at [place] as his support system. When asked what his goals he would like to work on while admitted, patient states, "to get myself together."  Skin assessment and body search completed with Amy, RN. Skin: warm/dry, nodule on posterior neck. No contrabands found.  Emotional support and reassurance provided throughout admission intake. Consents signed. Afterwards, oriented patient to unit, room and call light, reviewed POC with all questions answered and concerns voiced. Patient verbalized understanding. Denies any needs at this time.  Will continue to monitor with ongoing Q 15 minute safety checks per unit protocol.

## 2023-05-19 NOTE — ED Notes (Signed)
Pt resting quietly with eyes closed.  No pain or discomfort noted/voiced.  Breathing is even and unlabored.  Will continue to monitor for safety.  

## 2023-05-19 NOTE — Group Note (Signed)
 Recreation Therapy Group Note   Group Topic:Health and Wellness  Group Date: 05/19/2023 Start Time: 1400 End Time: 1450 Facilitators: Rosina Lowenstein, LRT, CTRS Location:  Dayroom  Activity Description/Intervention: Therapeutic Drumming. Patients with peers and staff were given the opportunity to engage in a leader facilitated [facility] Group Empowerment Drumming Circle with staff from the [employer], in partnership with [employer]. Teaching laboratory technician and trained [employer], Theodoro Doing leading with LRT observing and documenting intervention and pt response. This evidenced-based practice targets 7 areas of health and wellbeing in the human experience including: stress-reduction, exercise, self-expression, camaraderie/support, nurturing, spirituality, and music-making (leisure).    Goal Area(s) Addresses:  Patient will engage in pro-social way in music group.  Patient will follow directions of drum leader on the first prompt. Patient will demonstrate no behavioral issues during group.  Patient will identify if a reduction in stress level occurs as a result of participation in therapeutic drum circle.     Affect/Mood: N/A   Participation Level: Did not attend    Clinical Observations/Individualized Feedback: Patient did not attend group.   Plan: Continue to engage patient in RT group sessions 2-3x/week.   [address], LRT, CTRS 05/19/2023 5:10 PM

## 2023-05-19 NOTE — ED Notes (Signed)
 Patient discharged in no acute distress. Pt's belongings returned from locker #30. Patient escorted to the back [place]ley port by staff for transport to [facility] via [employer]

## 2023-05-19 NOTE — ED Notes (Signed)
 Safe transport arranged for pt. ETA within the hour.

## 2023-05-20 DIAGNOSIS — F322 Major depressive disorder, single episode, severe without psychotic features: Secondary | ICD-10-CM

## 2023-05-20 LAB — GLUCOSE, CAPILLARY
Glucose-Capillary: 169 mg/dL — ABNORMAL HIGH (ref 70–99)
Glucose-Capillary: 66 mg/dL — ABNORMAL LOW (ref 70–99)
Glucose-Capillary: 197 mg/dL — ABNORMAL HIGH (ref 70–99)
Glucose-Capillary: 212 mg/dL — ABNORMAL HIGH (ref 70–99)
Glucose-Capillary: 438 mg/dL — ABNORMAL HIGH (ref 70–99)
Glucose-Capillary: 220 mg/dL — ABNORMAL HIGH (ref 70–99)

## 2023-05-20 NOTE — Group Note (Signed)
 Date:  05/20/2023 Time:  8:49 PM  Group Topic/Focus:  Wrap-Up Group:   The focus of this group is to help patients review their daily goal of treatment and discuss progress on daily workbooks.    Participation Level:  Did Not Attend  Participation Quality:      Affect:      Cognitive:      Insight: None  Engagement in Group:      Modes of Intervention:      Additional Comments:    Joanmarie Tsang K Sherlyn Ebbert 05/20/2023, 8:49 PM

## 2023-05-20 NOTE — Progress Notes (Signed)
   05/20/23 1032  Psych Admission Type (Psych Patients Only)  Admission Status Voluntary  Psychosocial Assessment  Patient Complaints Anxiety;Depression;Worrying  Eye Contact Brief  Facial Expression Anxious;Worried;Sad  Affect Anxious;Depressed  Speech Logical/coherent  Interaction Minimal  Motor Activity Slow  Appearance/Hygiene In scrubs  Behavior Characteristics Cooperative  Mood Pleasant;Anxious  Thought Process  Coherency WDL  Content WDL  Delusions None reported or observed  Perception WDL  Hallucination None reported or observed  Judgment Poor  Confusion None  Danger to Self  Current suicidal ideation? Denies  Agreement Not to Harm Self Yes  Description of Agreement verbal  Danger to Others  Danger to Others None reported or observed

## 2023-05-20 NOTE — Plan of Care (Signed)
 Marland Kitchen

## 2023-05-20 NOTE — [facility] Counselor (Signed)
 CSW gave pt referral
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
## 2023-05-20 NOTE — [facility] Suicide Risk Assessment (Signed)
 [facility] Admission Suicide Risk Assessment   Nursing information obtained from:  Patient Demographic factors:  Male, Living alone, Low socioeconomic status Current Mental Status:  Self-harm thoughts Loss Factors:  Financial problems / change in socioeconomic status Historical Factors:  NA Risk Reduction Factors:  NA  Total Time spent with patient: 30 minutes Principal Problem: MDD (major depressive disorder), severe (HCC) Diagnosis:  Principal Problem:   MDD (major depressive disorder), severe (HCC)  Subjective Data: David Wyatt 63 year old male presents to [facility] from ED stating "am losing my mind." Patient reports losing his apartment 3 weeks ago and currently is disabled and homeless. Patient endorses substance use of crack cocaine with last use being yesterday. Patient endorse poor sleep, bad appetite with weight loss, poor concentration, Patient denies SI, denies HI Denies AH, endorses VH reporting he sees shadows.   Continued Clinical Symptoms:  Alcohol Use Disorder Identification Test Final Score (AUDIT): 0 The "Alcohol Use Disorders Identification Test", Guidelines for Use in Primary Care, Second Edition.  World Science writer [facility]). Score between 0-7:  no or low risk or alcohol related problems. Score between 8-15:  moderate risk of alcohol related problems. Score between 16-19:  high risk of alcohol related problems. Score 20 or above:  warrants further diagnostic evaluation for alcohol dependence and treatment.   CLINICAL FACTORS:   Depression:   Comorbid alcohol abuse/dependence Alcohol/Substance Abuse/Dependencies   Musculoskeletal: Strength & Muscle Tone: within normal limits Gait & Station: normal Patient leans: N/A  Psychiatric Specialty Exam:  Presentation  General Appearance:  Appropriate for Environment; Casual  Eye Contact: Fair  Speech: Clear and Coherent  Speech Volume: Normal  Handedness: Right   Mood and Affect   Mood: Depressed  Affect: Depressed   Thought Process  Thought Processes: Coherent  Descriptions of Associations:Intact  Orientation:Full (Time, Place and Person)  Thought Content:Logical  History of Schizophrenia/Schizoaffective disorder:No  Duration of Psychotic Symptoms:No data recorded Hallucinations:Hallucinations: None  Ideas of Reference:None  Suicidal Thoughts:Suicidal Thoughts: Yes, Passive SI Passive Intent and/or Plan: Without Intent; Without Plan  Homicidal Thoughts:Homicidal Thoughts: No   Sensorium  Memory: Immediate Fair; Recent Fair; Remote Fair  Judgment: Impaired  Insight: Shallow   Executive Functions  Concentration: Fair  Attention Span: Fair  Recall: [employer] of Knowledge: Fair  Language: Fair   Psychomotor Activity  Psychomotor Activity: Psychomotor Activity: Normal   Assets  Assets: Communication Skills; Desire for Improvement; Resilience   Sleep  Sleep: Sleep: Fair Number of Hours of Sleep: 7    Physical Exam: Physical Exam ROS Blood pressure (!) 157/94, pulse 63, temperature 98 F (36.7 C), resp. rate 18, height 5\' 8"  (1.727 m), weight 65.8 kg, SpO2 98%. Body mass index is 22.05 kg/m.   COGNITIVE FEATURES THAT CONTRIBUTE TO RISK:  Thought constriction (tunnel vision)    SUICIDE RISK:   Mild:  Suicidal ideation of limited frequency, intensity, duration, and specificity.  There are no identifiable plans, no associated intent, mild dysphoria and related symptoms, good self-control (both objective and subjective assessment), few other risk factors, and identifiable protective factors, including available and accessible social support.  PLAN OF CARE: Patient is admitted to Geropsychiatry unit with Q15 min checks, multi disciplinary team approach offered; medication management, group/milieu therapy offered  I certify that inpatient services furnished can reasonably be expected to improve the patient's  condition.   Verner Chol, MD 05/20/2023, 2:54 PM

## 2023-05-20 NOTE — Group Note (Signed)
 Recreation Therapy Group Note   Group Topic:Stress Management  Group Date: 05/20/2023 Start Time: 1215 End Time: 1300 Facilitators: Rosina Lowenstein, LRT, CTRS Location: Courtyard  Group Description: Outdoor Recreation. Patients had the option to play corn hole, ring toss, bowling or listening to music while outside in the courtyard getting fresh air and sunlight. LRT and patients discussed things that they enjoy doing in their free time outside of the hospital. LRT encouraged patients to drink water after being active and getting their heart rate up.   Goal Area(s) Addressed: Patient will identify leisure interests.  Patient will practice healthy decision making. Patient will engage in recreation activity.   Affect/Mood: N/A   Participation Level: Did not attend    Clinical Observations/Individualized Feedback: Patient did not attend group.   Plan: Continue to engage patient in RT group sessions 2-3x/week.   Rosina Lowenstein, LRT, CTRS 05/20/2023 1:23 PM

## 2023-05-20 NOTE — Group Note (Signed)
 Date:  05/20/2023 Time:  3:03 AM  Group Topic/Focus:  Wrap-Up Group:   The focus of this group is to help patients review their daily goal of treatment and discuss progress on daily workbooks.    Participation Level:  Minimal  Participation Quality:  Appropriate  Affect:  Appropriate  Cognitive:  Oriented  Insight: Good  Engagement in Group:  Limited  Modes of Intervention:  Discussion  Additional Comments:    Maeola Harman 05/20/2023, 3:03 AM

## 2023-05-20 NOTE — Group Note (Signed)
 Date:  05/20/2023 Time:  9:28 AM  Group Topic/Focus:  Movement therapy    Participation Level:  Did Not Attend    Rodena Goldmann 05/20/2023, 9:28 AM

## 2023-05-21 ENCOUNTER — Ambulatory Visit: Payer: MEDICAID | Admitting: Family Medicine

## 2023-05-21 LAB — GLUCOSE, CAPILLARY
Glucose-Capillary: 141 mg/dL — ABNORMAL HIGH (ref 70–99)
Glucose-Capillary: 128 mg/dL — ABNORMAL HIGH (ref 70–99)
Glucose-Capillary: 191 mg/dL — ABNORMAL HIGH (ref 70–99)
Glucose-Capillary: 127 mg/dL — ABNORMAL HIGH (ref 70–99)
Glucose-Capillary: 149 mg/dL — ABNORMAL HIGH (ref 70–99)

## 2023-05-21 MED ORDER — GLIPIZIDE 10 MG PO TABS
10.0000 mg | ORAL_TABLET | Freq: Every day | ORAL | Status: DC
  Administered 2023-05-22 – 2023-05-28 (×7): 10 mg via ORAL
  Filled 2023-05-21 (×7): qty 1

## 2023-05-21 NOTE — Inpatient Diabetes Management (Signed)
 Inpatient Diabetes Program Recommendations  AACE/ADA: New Consensus Statement on Inpatient Glycemic Control (2015)  Target Ranges:  Prepandial:   less than 140 mg/dL      Peak postprandial:   less than 180 mg/dL (1-2 hours)      Critically ill patients:  140 - 180 mg/dL   Lab Results  Component Value Date   GLUCAP 141 (H) 05/21/2023   HGBA1C 7.6 (A) 02/20/2023    Latest Reference Range & Units 05/20/23 07:25 05/20/23 11:34 05/20/23 15:11 05/20/23 19:47 05/20/23 19:51 05/20/23 21:45 05/21/23 07:39  Glucose-Capillary 70 - 99 mg/dL 161 (H) Novolog 3 units 66 (L) 197 (H) Novolog 3 units @ 1700 438 (H) 212 (H) 220 (H) 141 (H)  (H): Data is abnormally high (L): Data is abnormally low  Diabetes history: DM2 Outpatient Diabetes medications: Metformin 1000 BID + Glipizide 10 mg daily + Trulicity 1.5 mg Qweek + Novolog 0-9 TID per SSI (not sure if he has any of his meds??) Current orders for Inpatient glycemic control: Metformin 1 gm bid, Novolog 0-15 units tid, 0-5 units hs  Inpatient Diabetes Program Recommendations:  Please consider  -Decrease Novolog correction to 0-9 units tid, 0-5 units hs -Add home dose Glipizide 10 mg daily   Thank you, Darel Hong E. Katharyn Schauer, RN, MSN, CDCES  Diabetes Coordinator Inpatient Glycemic Control Team Team Pager [phone] (8am-5pm) 05/21/2023 9:58 AM

## 2023-05-21 NOTE — Group Note (Signed)
 Date:  05/21/2023 Time:  10:34 AM  Group Topic/Focus:  Orientation:   The focus of this group is to educate the patient on the purpose and policies of crisis stabilization and provide a format to answer questions about their admission.  The group details unit policies and expectations of patients while admitted.    Participation Level:  Active  Participation Quality:  Attentive  Affect:  Appropriate  Cognitive:  Appropriate  Insight: Appropriate  Engagement in Group:  Engaged  Modes of Intervention:  Socialization  Additional Comments:     Alexis Frock 05/21/2023, 10:34 AM

## 2023-05-21 NOTE — Plan of Care (Signed)
   Problem: Education: Goal: Utilization of techniques to improve thought processes will improve Outcome: Progressing Goal: Knowledge of the prescribed therapeutic regimen will improve Outcome: Progressing

## 2023-05-21 NOTE — Plan of Care (Signed)
   05/20/23 2300  Psych Admission Type (Psych Patients Only)  Admission Status Voluntary  Psychosocial Assessment  Patient Complaints Irritability;Anxiety  Eye Contact Poor  Facial Expression Worried  Affect Irritable  Speech Logical/coherent  Interaction Sarcastic  Motor Activity Slow  Appearance/Hygiene In scrubs  Behavior Characteristics Cooperative  Mood Anxious  Thought Process  Coherency WDL  Content WDL  Delusions None reported or observed  Perception WDL  Hallucination None reported or observed  Judgment Poor  Confusion None  Danger to Self  Current suicidal ideation? Denies  Danger to Others  Danger to Others None reported or observed    Problem: Education: Goal: Utilization of techniques to improve thought processes will improve Outcome: Progressing Goal: Knowledge of the prescribed therapeutic regimen will improve Outcome: Progressing   Problem: Education: Goal: Ability to state activities that reduce stress will improve Outcome: Progressing   Problem: Coping: Goal: Ability to identify and develop effective coping behavior will improve Outcome: Progressing

## 2023-05-21 NOTE — Group Note (Signed)
 Date:  05/21/2023 Time:  9:45 PM  Group Topic/Focus:  Self Care:   The focus of this group is to help patients understand the importance of self-care in order to improve or restore emotional, physical, spiritual, interpersonal, and financial health.    Participation Level:  Active  Participation Quality:  Appropriate  Affect:  Appropriate  Cognitive:  Appropriate  Insight: Appropriate  Engagement in Group:  Engaged  Modes of Intervention:  Education  Additional Comments:    Garry Heater 05/21/2023, 9:45 PM

## 2023-05-21 NOTE — BH IP Treatment Plan (Signed)
 Interdisciplinary Treatment and Diagnostic Plan Update  05/21/2023 Time of Session: 12:03 PM  Fairley Copher MRN: [id number]  Principal Diagnosis: MDD (major depressive disorder), severe (HCC)  Secondary Diagnoses: Principal Problem:   MDD (major depressive disorder), severe (HCC)   Current Medications:  Current Facility-Administered Medications  Medication Dose Route Frequency Provider Last Rate Last Admin   acetaminophen (TYLENOL) tablet 650 mg  650 mg Oral Q6H PRN Miller-Almeida, Carlena Hurl, NP       alum & mag hydroxide-simeth (MAALOX/MYLANTA) 200-200-20 MG/5ML suspension 30 mL  30 mL Oral Q4H PRN Miller-Almeida, Carlena Hurl, NP       amLODipine (NORVASC) tablet 10 mg  10 mg Oral Daily Miller-Almeida, Carlena Hurl, NP   10 mg at 05/21/23 4098   atorvastatin (LIPITOR) tablet 40 mg  40 mg Oral Daily Miller-Almeida, Carlena Hurl, NP   40 mg at 05/21/23 1191   cyclobenzaprine (FLEXERIL) tablet 10 mg  10 mg Oral BID PRN Miller-Almeida, Carlena Hurl, NP       DULoxetine (CYMBALTA) DR capsule 60 mg  60 mg Oral Daily Miller-Almeida, Carlena Hurl, NP   60 mg at 05/21/23 4782   gabapentin (NEURONTIN) capsule 300 mg  300 mg Oral QHS Miller-Almeida, Carlena Hurl, NP   300 mg at 05/20/23 2202   [START ON 05/22/2023] glipiZIDE (GLUCOTROL) tablet 10 mg  10 mg Oral QAC breakfast Verner Chol, MD       hydrOXYzine (ATARAX) tablet 25 mg  25 mg Oral TID PRN Donato Heinz, NP   25 mg at 05/19/23 2136   insulin aspart (novoLOG) injection 0-15 Units  0-15 Units Subcutaneous TID WC Miller-Almeida, Carlena Hurl, NP   2 Units at 05/21/23 1233   insulin aspart (novoLOG) injection 0-5 Units  0-5 Units Subcutaneous QHS Miller-Almeida, Carlena Hurl, NP   2 Units at 05/20/23 2157   magnesium hydroxide (MILK OF MAGNESIA) suspension 30 mL  30 mL Oral Daily PRN Miller-Almeida, Carlena Hurl, NP       metFORMIN (GLUCOPHAGE) tablet 1,000 mg  1,000 mg Oral BID WC Miller-Almeida, Carlena Hurl, NP   1,000 mg at 05/21/23 0750   naproxen (NAPROSYN) tablet 250 mg   250 mg Oral BID PRN Miller-Almeida, Carlena Hurl, NP       nicotine (NICODERM CQ - dosed in mg/24 hours) patch 14 mg  14 mg Transdermal Daily Verner Chol, MD   14 mg at 05/21/23 0928   OLANZapine (ZYPREXA) injection 5 mg  5 mg Intramuscular TID PRN Miller-Almeida, Carlena Hurl, NP       OLANZapine zydis (ZYPREXA) disintegrating tablet 5 mg  5 mg Oral TID PRN Miller-Almeida, Carlena Hurl, NP       PTA Medications: Medications Prior to Admission  Medication Sig Dispense Refill Last Dose/Taking   Accu-Chek Softclix Lancets lancets Use to check blood sugar 3 times daily. 100 each 6    acetaminophen (TYLENOL) 325 MG tablet Take 650 mg by mouth every 6 (six) hours as needed for mild pain or moderate pain.      amLODipine (NORVASC) 10 MG tablet Take 1 tablet (10 mg total) by mouth daily. 90 tablet 1    atorvastatin (LIPITOR) 40 MG tablet Take 1 tablet (40 mg total) by mouth daily. 30 tablet 4    Blood Glucose Monitoring Suppl (ACCU-CHEK GUIDE) w/Device KIT Use to check blood sugar 3 times daily. 1 kit 0    cholecalciferol (VITAMIN D3) 25 MCG (1000 UNIT) tablet Take 1,000 Units by mouth daily.  cyclobenzaprine (FLEXERIL) 10 MG tablet Take 1 tablet (10 mg total) by mouth 2 (two) times daily as needed for muscle spasms. 20 tablet 0    Dulaglutide (TRULICITY) 1.5 MG/0.5ML SOPN Inject 1.5 mg into the skin once a week.      DULoxetine (CYMBALTA) 60 MG capsule Take 1 capsule (60 mg total) by mouth daily. 90 capsule 1    famotidine (PEPCID) 10 MG tablet Take 1 tablet (10 mg total) by mouth 2 (two) times daily. (Patient not taking: Reported on 12/03/2022) 60 tablet 3    gabapentin (NEURONTIN) 300 MG capsule Take 1 capsule (300 mg total) by mouth at bedtime. 90 capsule 1    glipiZIDE (GLUCOTROL XL) 10 MG 24 hr tablet Take 1 tablet (10 mg total) by mouth daily with breakfast. 30 tablet 3    glucose blood (ACCU-CHEK GUIDE TEST) test strip Use to check blood sugar 3 times daily. 100 each 6    hyoscyamine (ANASPAZ) 0.125 MG  TBDP disintergrating tablet Place 1 tablet (0.125 mg total) under the tongue every 6 (six) hours as needed for up to 10 doses for bladder spasms. 10 tablet 0    insulin aspart (NOVOLOG) 100 UNIT/ML FlexPen Inject 0-9 Units into the skin 3 (three) times daily with meals. 30 mL 1    Insulin Pen Needle (COMFORT EZ PEN NEEDLES) 31G X 6 MM MISC use to inject insulin 3 times daily 100 each 1    lidocaine (LIDODERM) 5 % Place 1 patch onto the skin daily. Remove & Discard patch within 12 hours or as directed by MD 30 patch 0    Menthol, Topical Analgesic, 4 % GEL Apply 1 application  topically every 2 (two) hours as needed (Apply to painful muscle joints).      metFORMIN (GLUCOPHAGE) 1000 MG tablet Take 1 tablet (1,000 mg total) by mouth 2 (two) times daily with a meal. 60 tablet 4    naproxen sodium (ALEVE) 220 MG tablet Take 220 mg by mouth 2 (two) times daily as needed (Pain).      nicotine (NICODERM CQ) 7 mg/24hr patch Place 1 patch (7 mg total) onto the skin daily. 28 patch 3    nicotine polacrilex (NICORETTE) 4 MG gum Take 4 mg by mouth daily as needed for smoking cessation (between the gum and the cheek every 1-2 hours as needed for cigarette craving).      polyethylene glycol (MIRALAX / GLYCOLAX) 17 g packet Take 17 g by mouth 2 (two) times daily as needed for moderate constipation or mild constipation.      tamsulosin (FLOMAX) 0.4 MG CAPS capsule Take 1 capsule (0.4 mg total) by mouth daily. 30 capsule 8    traZODone (DESYREL) 100 MG tablet Take 1 tablet (100 mg total) by mouth at bedtime as needed for sleep. (Patient taking differently: Take 100 mg by mouth at bedtime.) 30 tablet 3     Patient Stressors: Financial difficulties   Substance abuse    Patient Strengths: Communication skills   Treatment Modalities: Medication Management, Group therapy, Case management,  1 to 1 session with clinician, Psychoeducation, Recreational therapy.   Physician Treatment Plan for Primary Diagnosis: MDD  (major depressive disorder), severe (HCC) Long Term Goal(s): Improvement in symptoms so as ready for discharge   Short Term Goals: Ability to identify changes in lifestyle to reduce recurrence of condition will improve Ability to verbalize feelings will improve Ability to disclose and discuss suicidal ideas Ability to demonstrate self-control will improve Ability to identify  and develop effective coping behaviors will improve Ability to maintain clinical measurements within normal limits will improve Compliance with prescribed medications will improve Ability to identify triggers associated with substance abuse/mental health issues will improve  Medication Management: Evaluate patient's response, side effects, and tolerance of medication regimen.  Therapeutic Interventions: 1 to 1 sessions, Unit Group sessions and Medication administration.  Evaluation of Outcomes: Progressing  Physician Treatment Plan for Secondary Diagnosis: Principal Problem:   MDD (major depressive disorder), severe (HCC)  Long Term Goal(s): Improvement in symptoms so as ready for discharge   Short Term Goals: Ability to identify changes in lifestyle to reduce recurrence of condition will improve Ability to verbalize feelings will improve Ability to disclose and discuss suicidal ideas Ability to demonstrate self-control will improve Ability to identify and develop effective coping behaviors will improve Ability to maintain clinical measurements within normal limits will improve Compliance with prescribed medications will improve Ability to identify triggers associated with substance abuse/mental health issues will improve     Medication Management: Evaluate patient's response, side effects, and tolerance of medication regimen.  Therapeutic Interventions: 1 to 1 sessions, Unit Group sessions and Medication administration.  Evaluation of Outcomes: Progressing   RN Treatment Plan for Primary Diagnosis: MDD  (major depressive disorder), severe (HCC) Long Term Goal(s): Knowledge of disease and therapeutic regimen to maintain health will improve  Short Term Goals: Ability to remain free from injury will improve, Ability to verbalize frustration and anger appropriately will improve, Ability to demonstrate self-control, Ability to participate in decision making will improve, Ability to verbalize feelings will improve, Ability to disclose and discuss suicidal ideas, Ability to identify and develop effective coping behaviors will improve, and Compliance with prescribed medications will improve  Medication Management: RN will administer medications as ordered by provider, will assess and evaluate patient's response and provide education to patient for prescribed medication. RN will report any adverse and/or side effects to prescribing provider.  Therapeutic Interventions: 1 on 1 counseling sessions, Psychoeducation, Medication administration, Evaluate responses to treatment, Monitor vital signs and CBGs as ordered, Perform/monitor CIWA, COWS, AIMS and Fall Risk screenings as ordered, Perform wound care treatments as ordered.  Evaluation of Outcomes: Progressing   LCSW Treatment Plan for Primary Diagnosis: MDD (major depressive disorder), severe (HCC) Long Term Goal(s): Safe transition to appropriate next level of care at discharge, Engage patient in therapeutic group addressing interpersonal concerns.  Short Term Goals: Engage patient in aftercare planning with referrals and resources, Increase social support, Increase ability to appropriately verbalize feelings, Increase emotional regulation, Facilitate acceptance of mental health diagnosis and concerns, Facilitate patient progression through stages of change regarding substance use diagnoses and concerns, Identify triggers associated with mental health/substance abuse issues, and Increase skills for wellness and recovery  Therapeutic Interventions: Assess  for all discharge needs, 1 to 1 time with Social worker, Explore available resources and support systems, Assess for adequacy in community support network, Educate family and significant other(s) on suicide prevention, Complete Psychosocial Assessment, Interpersonal group therapy.  Evaluation of Outcomes: Progressing   Progress in Treatment: Attending groups: Yes. and No. Participating in groups: Yes. and No. Taking medication as prescribed: Yes. Toleration medication: Yes. Family/Significant other contact made: No, will contact:  CSW will contact if given permission  Patient understands diagnosis: Yes. Discussing patient identified problems/goals with staff: Yes. Medical problems stabilized or resolved: Yes. Denies suicidal/homicidal ideation: Yes. Issues/concerns per patient self-inventory: No. Other: None   New problem(s) identified: No, Describe:  None identified   New Short  Term/Long Term Goal(s): detox, elimination of symptoms of psychosis, medication management for mood stabilization; elimination of SI thoughts; development of comprehensive mental wellness/sobriety plan.   Patient Goals:  "That's all, trying to get me somewhere to stay"   Discharge Plan or Barriers: CSW will assist with appropriate discharge planning   Reason for Continuation of Hospitalization: Depression Medication stabilization  Estimated Length of Stay: 1 to 7 days   Last 3 [place] Suicide Severity Risk Score: Flowsheet Row Admission (Current) from 05/19/2023 in [facility] Most recent reading at 05/19/2023  4:00 PM ED from 05/19/2023 in [facility] Most recent reading at 05/19/2023  3:40 AM ED from 05/18/2023 in [facility] Most recent reading at 05/18/2023  7:05 AM  C-SSRS RISK CATEGORY Low Risk Low Risk Low Risk       Last PHQ 2/9 Scores:    09/17/2022    3:23 PM 07/25/2022   11:16 AM 07/03/2022    4:42 PM   Depression screen PHQ 2/9  Decreased Interest 0 0 2  Down, Depressed, Hopeless 0 0 3  PHQ - 2 Score 0 0 5  Altered sleeping 0 2 3  Tired, decreased energy 0 0 3  Change in appetite 0 0 3  Feeling bad or failure about yourself  0 1 3  Trouble concentrating 0 1 3  Moving slowly or fidgety/restless 0 1 3  Suicidal thoughts 0 1 3  PHQ-9 Score 0 6 26  Difficult doing work/chores Not difficult at all  Extremely dIfficult    Scribe for Treatment Team: Elza Rafter, Theresia Majors 05/21/2023 4:39 PM

## 2023-05-21 NOTE — Progress Notes (Signed)
 [facility] MD Progress Note  05/21/2023 3:24 PM David Wyatt  MRN:  [id number]  David Wyatt 63 year old male presents to [facility] from ED stating "am losing my mind." Patient reports losing his apartment 3 weeks ago and currently is disabled and homeless. Patient endorses substance use of crack cocaine with last use being yesterday. Patient endorse poor sleep, bad appetite with weight loss, poor concentration, Patient denies SI, denies HI Denies AH, endorses VH reporting he sees shadows.   Subjective:  Chart reviewed, case discussed in multidisciplinary meeting, patient seen during rounds.  Patient is minimally engaged in interview but offers no complaints.  He denies feeling depressed or anxious and denies any active SI/HI/intent/plan.  He denies auditory/visual hallucinations.  Treatment team met with the patient and reports that finding a place to go he is his treatment goal.  He informed the team that he lost his ID and all the cards including his debit card and he needs to call SSN office to get an ID.   Sleep: Fair  Appetite:  Fair  Past Psychiatric History: see h&P Family History:  Family History  Problem Relation Age of Onset   Diabetes Mother    Kidney disease Mother    Hyperlipidemia Mother    Hyperlipidemia Father    Diabetes Father    Social History:  Social History   Substance and Sexual Activity  Alcohol Use No   Alcohol/week: 0.0 standard drinks of alcohol     Social History   Substance and Sexual Activity  Drug Use Yes   Types: "Crack" cocaine   Comment: when stressed    Social History   Socioeconomic History   Marital status: Divorced    Spouse name: Not on file   Number of children: 3   Years of education: 12   Highest education level: Not on file  Occupational History    Comment: disabled  Tobacco Use   Smoking status: Every Day    Current packs/day: 0.25    Average packs/day: 0.3 packs/day for 30.0 years (7.5 ttl pk-yrs)    Types: Cigarettes   Smokeless  tobacco: Former    Quit date: 08/15/2015   Tobacco comments:    04/29/16 2  cigs daily, 10/27/17 sometimes < .25 PPD  Vaping Use   Vaping status: Never Used  Substance and Sexual Activity   Alcohol use: No    Alcohol/week: 0.0 standard drinks of alcohol   Drug use: Yes    Types: "Crack" cocaine    Comment: when stressed   Sexual activity: Not on file  Other Topics Concern   Not on file  Social History Narrative   04/29/17   Lives in shelter   Caffeine- a lot of  tea, coffee   Social Drivers of Corporate investment banker Strain: Not on file  Food Insecurity: Food Insecurity Present (05/19/2023)   Hunger Vital Sign    Worried About Running Out of Food in the Last Year: Often true    Ran Out of Food in the Last Year: Often true  Transportation Needs: Unmet Transportation Needs (05/19/2023)   PRAPARE - Administrator, Civil Service (Medical): Yes    Lack of Transportation (Non-Medical): Yes  Physical Activity: Not on file  Stress: Not on file  Social Connections: Not on file   Past Medical History:  Past Medical History:  Diagnosis Date   Angioedema of lips 07/28/2012   left upper (07/29/2012)   Arthritis    hands and back   Chronic  back pain    Cocaine abuse (HCC)    Diabetic neuropathy (HCC)    High cholesterol    Hypertension    Neuropathy    Type II diabetes mellitus (HCC)     Past Surgical History:  Procedure Laterality Date   BACK SURGERY     CYSTOSCOPY W/ URETERAL STENT PLACEMENT Right 02/23/2021   Procedure: CYSTOSCOPY WITH RETROGRADE PYELOGRAM/URETERAL STENT PLACEMENT;  Surgeon: Jannifer Hick, MD;  Location: [facility];  Service: Urology;  Laterality: Right;   CYSTOSCOPY/URETEROSCOPY/HOLMIUM LASER/STENT PLACEMENT Right 12/05/2022   Procedure: CYSTOSCOPY, RIGHT URETEROSCOPY, AND RIGHT URETERAL STENT PLACEMENT;  Surgeon: Adonis Brook, MD;  Location: [facility];  Service: Urology;  Laterality: Right;  90 MINUTES   HERNIA REPAIR Right 04/01/2012   I & D  EXTREMITY Left 12/13/2012   Procedure: IRRIGATION AND DEBRIDEMENT LEFT THUMB;  Surgeon: Tami Ribas, MD;  Location: [facility] OR;  Service: Orthopedics;  Laterality: Left;   INCISION AND DRAINAGE OF WOUND     boil on back/notes 07/14/2008 (07/29/2012)   INGUINAL HERNIA REPAIR  04/01/2012   Procedure: HERNIA REPAIR INGUINAL ADULT;  Surgeon: Currie Paris, MD;  Location: [facility] OR;  Service: General;  Laterality: Right;   INGUINAL HERNIA REPAIR Left 09/06/2016   Procedure: OPEN REPAIR LEFT INGUINAL HERNIA;  Surgeon: Gaynelle Adu, MD;  Location: [facility] OR;  Service: General;  Laterality: Left;   INSERTION OF MESH Left 09/06/2016   Procedure: INSERTION OF MESH;  Surgeon: Gaynelle Adu, MD;  Location: [facility] OR;  Service: General;  Laterality: Left;   TONSILLECTOMY      Current Medications: Current Facility-Administered Medications  Medication Dose Route Frequency Provider Last Rate Last Admin   acetaminophen (TYLENOL) tablet 650 mg  650 mg Oral Q6H PRN Miller-Almeida, Carlena Hurl, NP       alum & mag hydroxide-simeth (MAALOX/MYLANTA) 200-200-20 MG/5ML suspension 30 mL  30 mL Oral Q4H PRN Miller-Almeida, Carlena Hurl, NP       amLODipine (NORVASC) tablet 10 mg  10 mg Oral Daily Miller-Almeida, Carlena Hurl, NP   10 mg at 05/21/23 0928   atorvastatin (LIPITOR) tablet 40 mg  40 mg Oral Daily Miller-Almeida, Carlena Hurl, NP   40 mg at 05/21/23 1610   cyclobenzaprine (FLEXERIL) tablet 10 mg  10 mg Oral BID PRN Miller-Almeida, Carlena Hurl, NP       DULoxetine (CYMBALTA) DR capsule 60 mg  60 mg Oral Daily Miller-Almeida, Carlena Hurl, NP   60 mg at 05/21/23 9604   gabapentin (NEURONTIN) capsule 300 mg  300 mg Oral QHS Miller-Almeida, Carlena Hurl, NP   300 mg at 05/20/23 2202   [START ON 05/22/2023] glipiZIDE (GLUCOTROL) tablet 10 mg  10 mg Oral QAC breakfast Verner Chol, MD       hydrOXYzine (ATARAX) tablet 25 mg  25 mg Oral TID PRN Donato Heinz, NP   25 mg at 05/19/23 2136   insulin aspart (novoLOG) injection 0-15 Units  0-15 Units  Subcutaneous TID WC Miller-Almeida, Carlena Hurl, NP   2 Units at 05/21/23 1233   insulin aspart (novoLOG) injection 0-5 Units  0-5 Units Subcutaneous QHS Miller-Almeida, Carlena Hurl, NP   2 Units at 05/20/23 2157   magnesium hydroxide (MILK OF MAGNESIA) suspension 30 mL  30 mL Oral Daily PRN Miller-Almeida, Carlena Hurl, NP       metFORMIN (GLUCOPHAGE) tablet 1,000 mg  1,000 mg Oral BID WC Miller-Almeida, Carlena Hurl, NP   1,000 mg at 05/21/23 0750   naproxen (NAPROSYN) tablet  250 mg  250 mg Oral BID PRN Miller-Almeida, Carlena Hurl, NP       nicotine (NICODERM CQ - dosed in mg/24 hours) patch 14 mg  14 mg Transdermal Daily Verner Chol, MD   14 mg at 05/21/23 0928   OLANZapine (ZYPREXA) injection 5 mg  5 mg Intramuscular TID PRN Miller-Almeida, Carlena Hurl, NP       OLANZapine zydis (ZYPREXA) disintegrating tablet 5 mg  5 mg Oral TID PRN Miller-Almeida, Carlena Hurl, NP        Lab Results:  Results for orders placed or performed during the hospital encounter of 05/19/23 (from the past 48 hours)  Glucose, capillary     Status: Abnormal   Collection Time: 05/19/23  4:58 PM  Result Value Ref Range   Glucose-Capillary 420 (H) 70 - 99 mg/dL    Comment: Glucose reference range applies only to samples taken after fasting for at least 8 hours.  Glucose, random     Status: Abnormal   Collection Time: 05/19/23  6:00 PM  Result Value Ref Range   Glucose, Bld 124 (H) 70 - 99 mg/dL    Comment: Glucose reference range applies only to samples taken after fasting for at least 8 hours. Performed at [facility], [address] [place], [place] [address]   Glucose, capillary     Status: None   Collection Time: 05/19/23  6:44 PM  Result Value Ref Range   Glucose-Capillary 86 70 - 99 mg/dL    Comment: Glucose reference range applies only to samples taken after fasting for at least 8 hours.  Glucose, capillary     Status: Abnormal   Collection Time: 05/19/23  7:15 PM  Result Value Ref Range   Glucose-Capillary 282 (H) 70  - 99 mg/dL    Comment: Glucose reference range applies only to samples taken after fasting for at least 8 hours.  Glucose, capillary     Status: Abnormal   Collection Time: 05/19/23  9:25 PM  Result Value Ref Range   Glucose-Capillary 141 (H) 70 - 99 mg/dL    Comment: Glucose reference range applies only to samples taken after fasting for at least 8 hours.  Glucose, capillary     Status: Abnormal   Collection Time: 05/20/23  7:25 AM  Result Value Ref Range   Glucose-Capillary 169 (H) 70 - 99 mg/dL    Comment: Glucose reference range applies only to samples taken after fasting for at least 8 hours.  Glucose, capillary     Status: Abnormal   Collection Time: 05/20/23 11:34 AM  Result Value Ref Range   Glucose-Capillary 66 (L) 70 - 99 mg/dL    Comment: Glucose reference range applies only to samples taken after fasting for at least 8 hours.  Glucose, capillary     Status: Abnormal   Collection Time: 05/20/23  3:11 PM  Result Value Ref Range   Glucose-Capillary 197 (H) 70 - 99 mg/dL    Comment: Glucose reference range applies only to samples taken after fasting for at least 8 hours.  Glucose, capillary     Status: Abnormal   Collection Time: 05/20/23  7:47 PM  Result Value Ref Range   Glucose-Capillary 438 (H) 70 - 99 mg/dL    Comment: Glucose reference range applies only to samples taken after fasting for at least 8 hours.  Glucose, capillary     Status: Abnormal   Collection Time: 05/20/23  7:51 PM  Result Value Ref Range   Glucose-Capillary 212 (  H) 70 - 99 mg/dL    Comment: Glucose reference range applies only to samples taken after fasting for at least 8 hours.  Glucose, capillary     Status: Abnormal   Collection Time: 05/20/23  9:45 PM  Result Value Ref Range   Glucose-Capillary 220 (H) 70 - 99 mg/dL    Comment: Glucose reference range applies only to samples taken after fasting for at least 8 hours.  Glucose, capillary     Status: Abnormal   Collection Time: 05/21/23  7:39 AM   Result Value Ref Range   Glucose-Capillary 141 (H) 70 - 99 mg/dL    Comment: Glucose reference range applies only to samples taken after fasting for at least 8 hours.  Glucose, capillary     Status: Abnormal   Collection Time: 05/21/23 11:36 AM  Result Value Ref Range   Glucose-Capillary 128 (H) 70 - 99 mg/dL    Comment: Glucose reference range applies only to samples taken after fasting for at least 8 hours.    Blood Alcohol level:  Lab Results  Component Value Date   ETH <10 05/19/2023   ETH <10 05/18/2023    Metabolic Disorder Labs: Lab Results  Component Value Date   HGBA1C 7.6 (A) 02/20/2023   MPG 309.18 12/02/2022   MPG 280.48 06/23/2022   Lab Results  Component Value Date   PROLACTIN 6.9 06/19/2022   Lab Results  Component Value Date   CHOL 172 05/18/2023   TRIG 192 (H) 05/18/2023   HDL 45 05/18/2023   CHOLHDL 3.8 05/18/2023   VLDL 38 05/18/2023   LDLCALC 89 05/18/2023   LDLCALC 122 (H) 05/23/2021    Physical Findings: AIMS:  , ,  ,  ,    CIWA:    COWS:      Psychiatric Specialty Exam:  Presentation  General Appearance:  Appropriate for Environment; Casual  Eye Contact: Fair  Speech: Clear and Coherent  Speech Volume: Normal    Mood and Affect  Mood: Depressed  Affect: Depressed   Thought Process  Thought Processes: Coherent  Descriptions of Associations:Intact  Orientation:Full (Time, Place and Person)  Thought Content:Logical  Hallucinations:Hallucinations: None  Ideas of Reference:None  Suicidal Thoughts:Suicidal Thoughts: No SI Passive Intent and/or Plan: Without Intent; Without Plan  Homicidal Thoughts:Homicidal Thoughts: No   Sensorium  Memory: Immediate Fair; Recent Fair; Remote Fair  Judgment: Impaired  Insight: Shallow   Executive Functions  Concentration: Fair  Attention Span: Fair  Recall: Fair  Fund of Knowledge: Fair  Language: Fair   Psychomotor Activity  Psychomotor  Activity: Psychomotor Activity: Normal  Musculoskeletal: Strength & Muscle Tone: within normal limits Gait & Station: normal Assets  Assets: Manufacturing systems engineer; Desire for Improvement; Resilience    Physical Exam: Physical Exam Vitals and nursing note reviewed.  HENT:     Head: Normocephalic.     Nose: Nose normal.     Mouth/Throat:     Mouth: Mucous membranes are moist.  Eyes:     Pupils: Pupils are equal, round, and reactive to light.  Cardiovascular:     Rate and Rhythm: Normal rate.     Pulses: Normal pulses.  Abdominal:     General: Bowel sounds are normal.  Skin:    General: Skin is warm.  Neurological:     General: No focal deficit present.     Mental Status: He is alert.    Review of Systems  Constitutional: Negative.   HENT: Negative.    Eyes: Negative.  Respiratory: Negative.    Cardiovascular: Negative.   Gastrointestinal: Negative.   Skin: Negative.   Neurological: Negative.    Blood pressure (!) 134/95, pulse 77, temperature 98.2 F (36.8 C), resp. rate 18, height 5\' 8"  (1.727 m), weight 65.8 kg, SpO2 98%. Body mass index is 22.05 kg/m.  Diagnosis: Principal Problem:   MDD (major depressive disorder), severe (HCC)   Diagnosis:  Principal Problem:   MDD (major depressive disorder), severe (HCC) Stimulant Use disorder, severe   Clinical Decision Making: Patient with history of depression and chronic cocaine use with multiple psychosocial stressors including homelessness, having no family support admitted for suicidal ideation with plan.  He is currently admitted for inpatient psychiatric stabilization.   Treatment Plan Summary:   Safety and Monitoring:             -- Voluntary admission to inpatient psychiatric unit for safety, stabilization and treatment             -- Daily contact with patient to assess and evaluate symptoms and progress in treatment             -- Patient's case to be discussed in multi-disciplinary team meeting              -- Observation Level: q15 minute checks             -- Vital signs:  q12 hours             -- Precautions: suicide, elopement, and assault   2. Psychiatric Diagnoses and Treatment:                Continue Cymbalta and Gabapentin Trazodone 50 mg nightly for insomnia -- The risks/benefits/side-effects/alternatives to this medication were discussed in detail with the patient and time was given for questions. The patient consents to medication trial.                -- Metabolic profile and EKG monitoring obtained while on an atypical antipsychotic (BMI: Lipid Panel: HbgA1c: QTc:)              -- Encouraged patient to participate in unit milieu and in scheduled group therapies                            3. Medical Issues Being Addressed:     No urgent medical problems 4. Discharge Planning:              -- Social work and case management to assist with discharge planning and identification of hospital follow-up needs prior to discharge             -- Estimated LOS: 5-7 days             -- Discharge Concerns: Need to establish a safety plan; Medication compliance and effectiveness             -- Discharge Goals: Return home with outpatient referrals follow ups   Physician Treatment Plan for Primary Diagnosis: MDD (major depressive disorder), severe (HCC) Long Term Goal(s): Improvement in symptoms so as ready for discharge   Short Term Goals: Ability to identify changes in lifestyle to reduce recurrence of condition will improve, Ability to verbalize feelings will improve, Ability to disclose and discuss suicidal ideas, Ability to demonstrate self-control will improve, and Ability to identify and develop effective coping behaviors will improve   Physician Treatment Plan for Secondary Diagnosis: Principal Problem:   MDD (major  depressive disorder), severe (HCC)   Long Term Goal(s): Improvement in symptoms so as ready for discharge   Short Term Goals: Ability to identify changes in  lifestyle to reduce recurrence of condition will improve, Ability to verbalize feelings will improve, Ability to disclose and discuss suicidal ideas, Ability to demonstrate self-control will improve, Ability to identify and develop effective coping behaviors will improve, Ability to maintain clinical measurements within normal limits will improve, Compliance with prescribed medications will improve, and Ability to identify triggers associated with substance abuse/mental health issues will improve    Verner Chol, MD 05/21/2023, 3:24 PM

## 2023-05-21 NOTE — Group Note (Signed)
 Therapy Group Note  Group Topic: Transfer Training  Group Date: 05/21/2023 Start Time: 1300 End Time: 1335 Facilitators: Hisao Doo, Thomes Dinning, PT    Group Description: Group educated on sequence and techniques to maximize safety with functional transfers.  Additionally, integrated education on impact of seating surfaces, use of assistive device and management of orthostasis with movement transitions.  Patients actively engaged with functional transfers (sit/stand) from various seating surfaces, with and without assist devices, working to integrate and retain education provided during session.  Allowed time for questions and further discussion on mobility concerns/needs.     Therapeutic Goal(s): Identify and demonstrate safe technique for sit/stand transfers from various seating surfaces. Identify and demonstrate safe use of assistive devices with basic transfers and simple mobility. Identify and demonstrate ability to recognize signs/symptoms of orthostasis and appropriate compensatory/safety techniques.   Individual Participation: Pt not present      Participation Level: Did not attend   Participation Quality:    Behavior:    Speech/Thought Process:    Affect/Mood:    Insight:    Judgement:    Individualization:   Modes of Intervention:   Patient Response to Interventions:     Plan: Continue to engage patient in OT groups 1 - 2x/week.  Ovidio Hanger PT, DPT 05/21/23, 2:33 PM

## 2023-05-21 NOTE — Progress Notes (Signed)
   05/21/23 1200  Psych Admission Type (Psych Patients Only)  Admission Status Voluntary  Psychosocial Assessment  Patient Complaints Anxiety;Worrying  Eye Contact Brief  Facial Expression Worried  Affect Depressed;Anxious  Speech Logical/coherent  Interaction Assertive  Motor Activity Slow  Appearance/Hygiene In scrubs  Behavior Characteristics Cooperative  Mood Anxious  Thought Process  Coherency WDL  Content WDL  Delusions None reported or observed  Perception WDL  Hallucination None reported or observed  Judgment Poor  Confusion None  Danger to Self  Current suicidal ideation? Denies  Agreement Not to Harm Self Yes  Description of Agreement verbal  Danger to Others  Danger to Others None reported or observed

## 2023-05-22 LAB — GLUCOSE, CAPILLARY
Glucose-Capillary: 194 mg/dL — ABNORMAL HIGH (ref 70–99)
Glucose-Capillary: 153 mg/dL — ABNORMAL HIGH (ref 70–99)
Glucose-Capillary: 79 mg/dL (ref 70–99)
Glucose-Capillary: 178 mg/dL — ABNORMAL HIGH (ref 70–99)
Glucose-Capillary: 157 mg/dL — ABNORMAL HIGH (ref 70–99)

## 2023-05-22 NOTE — [facility] Counselor (Signed)
 Pt came to speak with CSW regarding his EBT card, his Social Security card and his Driver's license.   Pt reports that he has his EBT card on the way to "an old address" where he reports he gets his mail.   CSW informed pt that given the time-frame, CSW is unable to assist with placement, but can provide resources for boarding homes, long-stay hotels, or shelters.    Pt declines at this time.    Reynaldo Minium, MSW, [place] 05/22/2023 3:28 PM

## 2023-05-22 NOTE — Progress Notes (Signed)
 [facility] MD Progress Note  05/22/2023 9:47 AM David Wyatt  MRN:  [id number]  David Wyatt 63 year old male presents to [facility] from ED stating "am losing my mind." Patient reports losing his apartment 3 weeks ago and currently is disabled and homeless. Patient endorses substance use of crack cocaine with last use being yesterday. Patient endorse poor sleep, bad appetite with weight loss, poor concentration, Patient denies SI, denies HI Denies AH, endorses VH reporting he sees shadows.   Subjective:  Chart reviewed, case discussed in multidisciplinary meeting, patient seen during rounds.  Patient is noted to be resting in his room.  He reports his main goal is to find a place to go to.  Provider encouraged him to start calling the places including boarding houses and rehabs as finding a place alone cannot be a treatment goal to be in a highly restrictive inpatient setting.  He denies SI/HI/intent/plan.  He denies auditory/visual hallucinations.  He expressed his understanding and will be engaging and working with the Child psychotherapist to finalize the postdischarge transition   Sleep: Fair  Appetite:  Fair  Past Psychiatric History: see h&P Family History:  Family History  Problem Relation Age of Onset   Diabetes Mother    Kidney disease Mother    Hyperlipidemia Mother    Hyperlipidemia Father    Diabetes Father    Social History:  Social History   Substance and Sexual Activity  Alcohol Use No   Alcohol/week: 0.0 standard drinks of alcohol     Social History   Substance and Sexual Activity  Drug Use Yes   Types: "Crack" cocaine   Comment: when stressed    Social History   Socioeconomic History   Marital status: Divorced    Spouse name: Not on file   Number of children: 3   Years of education: 12   Highest education level: Not on file  Occupational History    Comment: disabled  Tobacco Use   Smoking status: Every Day    Current packs/day: 0.25    Average packs/day: 0.3 packs/day for  30.0 years (7.5 ttl pk-yrs)    Types: Cigarettes   Smokeless tobacco: Former    Quit date: 08/15/2015   Tobacco comments:    04/29/16 2  cigs daily, 10/27/17 sometimes < .25 PPD  Vaping Use   Vaping status: Never Used  Substance and Sexual Activity   Alcohol use: No    Alcohol/week: 0.0 standard drinks of alcohol   Drug use: Yes    Types: "Crack" cocaine    Comment: when stressed   Sexual activity: Not on file  Other Topics Concern   Not on file  Social History Narrative   04/29/17   Lives in shelter   Caffeine- a lot of  tea, coffee   Social Drivers of Corporate investment banker Strain: Not on file  Food Insecurity: Food Insecurity Present (05/19/2023)   Hunger Vital Sign    Worried About Running Out of Food in the Last Year: Often true    Ran Out of Food in the Last Year: Often true  Transportation Needs: Unmet Transportation Needs (05/19/2023)   PRAPARE - Administrator, Civil Service (Medical): Yes    Lack of Transportation (Non-Medical): Yes  Physical Activity: Not on file  Stress: Not on file  Social Connections: Not on file   Past Medical History:  Past Medical History:  Diagnosis Date   Angioedema of lips 07/28/2012   left upper (07/29/2012)   Arthritis  hands and back   Chronic back pain    Cocaine abuse (HCC)    Diabetic neuropathy (HCC)    High cholesterol    Hypertension    Neuropathy    Type II diabetes mellitus (HCC)     Past Surgical History:  Procedure Laterality Date   BACK SURGERY     CYSTOSCOPY W/ URETERAL STENT PLACEMENT Right 02/23/2021   Procedure: CYSTOSCOPY WITH RETROGRADE PYELOGRAM/URETERAL STENT PLACEMENT;  Surgeon: Jannifer Hick, MD;  Location: [facility] ORS;  Service: Urology;  Laterality: Right;   CYSTOSCOPY/URETEROSCOPY/HOLMIUM LASER/STENT PLACEMENT Right 12/05/2022   Procedure: CYSTOSCOPY, RIGHT URETEROSCOPY, AND RIGHT URETERAL STENT PLACEMENT;  Surgeon: Adonis Brook, MD;  Location: [facility];  Service: Urology;  Laterality:  Right;  90 MINUTES   HERNIA REPAIR Right 04/01/2012   I & D EXTREMITY Left 12/13/2012   Procedure: IRRIGATION AND DEBRIDEMENT LEFT THUMB;  Surgeon: Tami Ribas, MD;  Location: [facility] OR;  Service: Orthopedics;  Laterality: Left;   INCISION AND DRAINAGE OF WOUND     boil on back/notes 07/14/2008 (07/29/2012)   INGUINAL HERNIA REPAIR  04/01/2012   Procedure: HERNIA REPAIR INGUINAL ADULT;  Surgeon: Currie Paris, MD;  Location: [facility] OR;  Service: General;  Laterality: Right;   INGUINAL HERNIA REPAIR Left 09/06/2016   Procedure: OPEN REPAIR LEFT INGUINAL HERNIA;  Surgeon: Gaynelle Adu, MD;  Location: [facility] OR;  Service: General;  Laterality: Left;   INSERTION OF MESH Left 09/06/2016   Procedure: INSERTION OF MESH;  Surgeon: Gaynelle Adu, MD;  Location: [facility] OR;  Service: General;  Laterality: Left;   TONSILLECTOMY      Current Medications: Current Facility-Administered Medications  Medication Dose Route Frequency Provider Last Rate Last Admin   acetaminophen (TYLENOL) tablet 650 mg  650 mg Oral Q6H PRN Miller-Almeida, Carlena Hurl, NP       alum & mag hydroxide-simeth (MAALOX/MYLANTA) 200-200-20 MG/5ML suspension 30 mL  30 mL Oral Q4H PRN Miller-Almeida, Carlena Hurl, NP       amLODipine (NORVASC) tablet 10 mg  10 mg Oral Daily Miller-Almeida, Carlena Hurl, NP   10 mg at 05/22/23 0935   atorvastatin (LIPITOR) tablet 40 mg  40 mg Oral Daily Miller-Almeida, Carlena Hurl, NP   40 mg at 05/22/23 0935   cyclobenzaprine (FLEXERIL) tablet 10 mg  10 mg Oral BID PRN Miller-Almeida, Carlena Hurl, NP       DULoxetine (CYMBALTA) DR capsule 60 mg  60 mg Oral Daily Miller-Almeida, Carlena Hurl, NP   60 mg at 05/22/23 0935   gabapentin (NEURONTIN) capsule 300 mg  300 mg Oral QHS Miller-Almeida, Carlena Hurl, NP   300 mg at 05/21/23 2117   glipiZIDE (GLUCOTROL) tablet 10 mg  10 mg Oral QAC breakfast Verner Chol, MD   10 mg at 05/22/23 0935   hydrOXYzine (ATARAX) tablet 25 mg  25 mg Oral TID PRN Donato Heinz, NP   25 mg at 05/19/23 2136    insulin aspart (novoLOG) injection 0-15 Units  0-15 Units Subcutaneous TID WC Miller-Almeida, Carlena Hurl, NP   3 Units at 05/22/23 0747   insulin aspart (novoLOG) injection 0-5 Units  0-5 Units Subcutaneous QHS Miller-Almeida, Carlena Hurl, NP   2 Units at 05/20/23 2157   magnesium hydroxide (MILK OF MAGNESIA) suspension 30 mL  30 mL Oral Daily PRN Miller-Almeida, Carlena Hurl, NP       metFORMIN (GLUCOPHAGE) tablet 1,000 mg  1,000 mg Oral BID WC Miller-Almeida, Carlena Hurl, NP   1,000 mg at 05/22/23  0746   naproxen (NAPROSYN) tablet 250 mg  250 mg Oral BID PRN Miller-Almeida, Carlena Hurl, NP       nicotine (NICODERM CQ - dosed in mg/24 hours) patch 14 mg  14 mg Transdermal Daily Verner Chol, MD   14 mg at 05/22/23 0935   OLANZapine (ZYPREXA) injection 5 mg  5 mg Intramuscular TID PRN Miller-Almeida, Carlena Hurl, NP       OLANZapine zydis (ZYPREXA) disintegrating tablet 5 mg  5 mg Oral TID PRN Miller-Almeida, Carlena Hurl, NP        Lab Results:  Results for orders placed or performed during the hospital encounter of 05/19/23 (from the past 48 hours)  Glucose, capillary     Status: Abnormal   Collection Time: 05/20/23 11:34 AM  Result Value Ref Range   Glucose-Capillary 66 (L) 70 - 99 mg/dL    Comment: Glucose reference range applies only to samples taken after fasting for at least 8 hours.  Glucose, capillary     Status: Abnormal   Collection Time: 05/20/23  3:11 PM  Result Value Ref Range   Glucose-Capillary 197 (H) 70 - 99 mg/dL    Comment: Glucose reference range applies only to samples taken after fasting for at least 8 hours.  Glucose, capillary     Status: Abnormal   Collection Time: 05/20/23  7:47 PM  Result Value Ref Range   Glucose-Capillary 438 (H) 70 - 99 mg/dL    Comment: Glucose reference range applies only to samples taken after fasting for at least 8 hours.  Glucose, capillary     Status: Abnormal   Collection Time: 05/20/23  7:51 PM  Result Value Ref Range   Glucose-Capillary 212 (H) 70 - 99  mg/dL    Comment: Glucose reference range applies only to samples taken after fasting for at least 8 hours.  Glucose, capillary     Status: Abnormal   Collection Time: 05/20/23  9:45 PM  Result Value Ref Range   Glucose-Capillary 220 (H) 70 - 99 mg/dL    Comment: Glucose reference range applies only to samples taken after fasting for at least 8 hours.  Glucose, capillary     Status: Abnormal   Collection Time: 05/21/23  7:39 AM  Result Value Ref Range   Glucose-Capillary 141 (H) 70 - 99 mg/dL    Comment: Glucose reference range applies only to samples taken after fasting for at least 8 hours.  Glucose, capillary     Status: Abnormal   Collection Time: 05/21/23 11:36 AM  Result Value Ref Range   Glucose-Capillary 128 (H) 70 - 99 mg/dL    Comment: Glucose reference range applies only to samples taken after fasting for at least 8 hours.  Glucose, capillary     Status: Abnormal   Collection Time: 05/21/23  5:05 PM  Result Value Ref Range   Glucose-Capillary 191 (H) 70 - 99 mg/dL    Comment: Glucose reference range applies only to samples taken after fasting for at least 8 hours.  Glucose, capillary     Status: Abnormal   Collection Time: 05/21/23  7:28 PM  Result Value Ref Range   Glucose-Capillary 127 (H) 70 - 99 mg/dL    Comment: Glucose reference range applies only to samples taken after fasting for at least 8 hours.  Glucose, capillary     Status: Abnormal   Collection Time: 05/21/23  9:10 PM  Result Value Ref Range   Glucose-Capillary 149 (H) 70 - 99 mg/dL  Comment: Glucose reference range applies only to samples taken after fasting for at least 8 hours.  Glucose, capillary     Status: Abnormal   Collection Time: 05/22/23  7:35 AM  Result Value Ref Range   Glucose-Capillary 194 (H) 70 - 99 mg/dL    Comment: Glucose reference range applies only to samples taken after fasting for at least 8 hours.    Blood Alcohol level:  Lab Results  Component Value Date   ETH <10  05/19/2023   ETH <10 05/18/2023    Metabolic Disorder Labs: Lab Results  Component Value Date   HGBA1C 7.6 (A) 02/20/2023   MPG 309.18 12/02/2022   MPG 280.48 06/23/2022   Lab Results  Component Value Date   PROLACTIN 6.9 06/19/2022   Lab Results  Component Value Date   CHOL 172 05/18/2023   TRIG 192 (H) 05/18/2023   HDL 45 05/18/2023   CHOLHDL 3.8 05/18/2023   VLDL 38 05/18/2023   LDLCALC 89 05/18/2023   LDLCALC 122 (H) 05/23/2021    Physical Findings: AIMS:  , ,  ,  ,    CIWA:    COWS:      Psychiatric Specialty Exam:  Presentation  General Appearance:  Appropriate for Environment; Casual  Eye Contact: Fair  Speech: Clear and Coherent  Speech Volume: Normal    Mood and Affect  Mood: Depressed  Affect: Depressed   Thought Process  Thought Processes: Coherent  Descriptions of Associations:Intact  Orientation:Full (Time, Place and Person)  Thought Content:Logical  Hallucinations:Hallucinations: None  Ideas of Reference:None  Suicidal Thoughts:Suicidal Thoughts: No  Homicidal Thoughts:Homicidal Thoughts: No   Sensorium  Memory: Immediate Fair; Recent Fair; Remote Fair  Judgment: Impaired  Insight: Shallow   Executive Functions  Concentration: Fair  Attention Span: Fair  Recall: [employer] of Knowledge: Fair  Language: Fair   Psychomotor Activity  Psychomotor Activity: Psychomotor Activity: Normal  Musculoskeletal: Strength & Muscle Tone: within normal limits Gait & Station: normal Assets  Assets: Manufacturing systems engineer; Financial Resources/Insurance; Resilience    Physical Exam: Physical Exam Vitals and nursing note reviewed.  HENT:     Head: Normocephalic.     Nose: Nose normal.     Mouth/Throat:     Mouth: Mucous membranes are moist.  Eyes:     Pupils: Pupils are equal, round, and reactive to light.  Cardiovascular:     Rate and Rhythm: Normal rate.     Pulses: Normal pulses.  Abdominal:      General: Bowel sounds are normal.  Skin:    General: Skin is warm.  Neurological:     General: No focal deficit present.     Mental Status: He is alert.    Review of Systems  Constitutional: Negative.   HENT: Negative.    Eyes: Negative.   Respiratory: Negative.    Cardiovascular: Negative.   Gastrointestinal: Negative.   Skin: Negative.   Neurological: Negative.    Blood pressure (!) 158/90, pulse 62, temperature 98.2 F (36.8 C), resp. rate 18, height 5\' 8"  (1.727 m), weight 65.8 kg, SpO2 98%. Body mass index is 22.05 kg/m.  Diagnosis: Principal Problem:   MDD (major depressive disorder), severe (HCC)   Diagnosis:  Principal Problem:   MDD (major depressive disorder), severe (HCC) Stimulant Use disorder, severe   Clinical Decision Making: Patient with history of depression and chronic cocaine use with multiple psychosocial stressors including homelessness, having no family support admitted for suicidal ideation with plan.  He is currently admitted  for inpatient psychiatric stabilization.   Treatment Plan Summary:   Safety and Monitoring:             -- Voluntary admission to inpatient psychiatric unit for safety, stabilization and treatment             -- Daily contact with patient to assess and evaluate symptoms and progress in treatment             -- Patient's case to be discussed in multi-disciplinary team meeting             -- Observation Level: q15 minute checks             -- Vital signs:  q12 hours             -- Precautions: suicide, elopement, and assault   2. Psychiatric Diagnoses and Treatment:                Continue Cymbalta and Gabapentin Trazodone 50 mg nightly for insomnia -- The risks/benefits/side-effects/alternatives to this medication were discussed in detail with the patient and time was given for questions. The patient consents to medication trial.                -- Metabolic profile and EKG monitoring obtained while on an atypical  antipsychotic (BMI: Lipid Panel: HbgA1c: QTc:)              -- Encouraged patient to participate in unit milieu and in scheduled group therapies                            3. Medical Issues Being Addressed:     No urgent medical problems 4. Discharge Planning:              -- Social work and case management to assist with discharge planning and identification of hospital follow-up needs prior to discharge             -- Estimated LOS: 5-7 days             -- Discharge Concerns: Need to establish a safety plan; Medication compliance and effectiveness             -- Discharge Goals: Return home with outpatient referrals follow ups   Physician Treatment Plan for Primary Diagnosis: MDD (major depressive disorder), severe (HCC) Long Term Goal(s): Improvement in symptoms so as ready for discharge   Short Term Goals: Ability to identify changes in lifestyle to reduce recurrence of condition will improve, Ability to verbalize feelings will improve, Ability to disclose and discuss suicidal ideas, Ability to demonstrate self-control will improve, and Ability to identify and develop effective coping behaviors will improve   Physician Treatment Plan for Secondary Diagnosis: Principal Problem:   MDD (major depressive disorder), severe (HCC)   Long Term Goal(s): Improvement in symptoms so as ready for discharge   Short Term Goals: Ability to identify changes in lifestyle to reduce recurrence of condition will improve, Ability to verbalize feelings will improve, Ability to disclose and discuss suicidal ideas, Ability to demonstrate self-control will improve, Ability to identify and develop effective coping behaviors will improve, Ability to maintain clinical measurements within normal limits will improve, Compliance with prescribed medications will improve, and Ability to identify triggers associated with substance abuse/mental health issues will improve    Verner Chol, MD 05/22/2023, 9:47 AM

## 2023-05-22 NOTE — Plan of Care (Signed)
   05/22/23 0300  Psych Admission Type (Psych Patients Only)  Admission Status Voluntary  Psychosocial Assessment  Patient Complaints Irritability  Eye Contact Poor  Facial Expression Worried  Affect Irritable  Speech Logical/coherent  Interaction Sarcastic  Motor Activity Slow  Appearance/Hygiene In scrubs  Behavior Characteristics Cooperative  Mood Irritable  Thought Process  Coherency WDL  Content WDL  Delusions None reported or observed  Perception WDL  Hallucination None reported or observed  Judgment Poor  Confusion None  Danger to Self  Current suicidal ideation? Denies  Danger to Others  Danger to Others None reported or observed   Problem: Education: Goal: Utilization of techniques to improve thought processes will improve Outcome: Progressing Goal: Knowledge of the prescribed therapeutic regimen will improve Outcome: Progressing   Problem: Education: Goal: Ability to state activities that reduce stress will improve Outcome: Progressing   Problem: Coping: Goal: Ability to identify and develop effective coping behavior will improve Outcome: Progressing

## 2023-05-22 NOTE — Plan of Care (Signed)
   Problem: Education: Goal: Knowledge of [facility] Education information/materials will improve Outcome: Progressing Goal: Verbalization of understanding the information provided will improve Outcome: Progressing

## 2023-05-23 LAB — GLUCOSE, CAPILLARY
Glucose-Capillary: 166 mg/dL — ABNORMAL HIGH (ref 70–99)
Glucose-Capillary: 288 mg/dL — ABNORMAL HIGH (ref 70–99)
Glucose-Capillary: 245 mg/dL — ABNORMAL HIGH (ref 70–99)
Glucose-Capillary: 142 mg/dL — ABNORMAL HIGH (ref 70–99)
Glucose-Capillary: 205 mg/dL — ABNORMAL HIGH (ref 70–99)

## 2023-05-23 LAB — HEMOGLOBIN A1C
Hgb A1c MFr Bld: 8.3 % — ABNORMAL HIGH (ref 4.8–5.6)
Mean Plasma Glucose: 191.51 mg/dL

## 2023-05-23 MED ORDER — TRAZODONE HCL 50 MG PO TABS
50.0000 mg | ORAL_TABLET | Freq: Every day | ORAL | Status: DC
  Administered 2023-05-23 – 2023-05-27 (×5): 50 mg via ORAL
  Filled 2023-05-23 (×5): qty 1

## 2023-05-23 NOTE — Progress Notes (Signed)
   05/23/23 1000  Psych Admission Type (Psych Patients Only)  Admission Status Voluntary  Psychosocial Assessment  Patient Complaints Anxiety  Eye Contact Brief  Facial Expression Anxious  Affect Depressed;Anxious  Speech Logical/coherent  Interaction Assertive  Motor Activity Slow  Appearance/Hygiene In scrubs  Behavior Characteristics Cooperative  Mood Anxious  Thought Process  Coherency WDL  Content WDL  Delusions None reported or observed  Perception WDL  Hallucination None reported or observed  Judgment Poor  Confusion None  Danger to Self  Current suicidal ideation? Denies  Agreement Not to Harm Self Yes  Description of Agreement verbal  Danger to Others  Danger to Others None reported or observed

## 2023-05-23 NOTE — Progress Notes (Signed)
 Per Kathlen Brunswick, the diabetes coordinator, it would be appropriate to give this patient a half of a [place] sandwich for example in between lunch and dinner as a snack.

## 2023-05-23 NOTE — Group Note (Signed)
 Date:  05/23/2023 Time:  10:11 PM  Group Topic/Focus:  Goals Group:   The focus of this group is to help patients establish daily goals to achieve during treatment and discuss how the patient can incorporate goal setting into their daily lives to aide in recovery.    Participation Level:  Active  Participation Quality:  Appropriate and Attentive  Affect:  Appropriate  Cognitive:  Alert and Appropriate  Insight: Appropriate and Good  Engagement in Group:  Developing/Improving and Engaged  Modes of Intervention:  Discussion, Rapport Building, Socialization, and Support  Additional Comments:     Kharlie Bring 05/23/2023, 10:11 PM

## 2023-05-23 NOTE — Plan of Care (Signed)
   05/22/23 2033  Psych Admission Type (Psych Patients Only)  Admission Status Voluntary  Psychosocial Assessment  Patient Complaints Appetite increase  Eye Contact Poor  Facial Expression Worried  Affect Irritable  Speech Logical/coherent  Interaction Sarcastic  Motor Activity Slow  Appearance/Hygiene In scrubs  Behavior Characteristics Irritable  Mood Irritable  Thought Process  Coherency WDL  Content WDL  Delusions None reported or observed  Perception WDL  Hallucination None reported or observed  Judgment Poor  Confusion None  Danger to Self  Current suicidal ideation? Denies  Danger to Others  Danger to Others None reported or observed   Problem: Education: Goal: Utilization of techniques to improve thought processes will improve Outcome: Progressing Goal: Knowledge of the prescribed therapeutic regimen will improve Outcome: Progressing   Problem: Education: Goal: Ability to state activities that reduce stress will improve Outcome: Progressing   Problem: Coping: Goal: Ability to identify and develop effective coping behavior will improve Outcome: Progressing

## 2023-05-23 NOTE — Plan of Care (Signed)
  Problem: Education: Goal: Utilization of techniques to improve thought processes will improve 05/23/2023 1740 by Luane School, RN Outcome: Progressing 05/23/2023 1053 by Luane School, RN Outcome: Progressing Goal: Knowledge of the prescribed therapeutic regimen will improve 05/23/2023 1740 by Luane School, RN Outcome: Progressing 05/23/2023 1053 by Luane School, RN Outcome: Progressing

## 2023-05-23 NOTE — Progress Notes (Signed)
 [facility] MD Progress Note  05/23/2023 4:45 PM David Wyatt  MRN:  [id number]  Karim 63 year old male presents to [facility] from ED stating "am losing my mind." Patient reports losing his apartment 3 weeks ago and currently is disabled and homeless. Patient endorses substance use of crack cocaine with last use being yesterday. Patient endorse poor sleep, bad appetite with weight loss, poor concentration, Patient denies SI, denies HI Denies AH, endorses VH reporting he sees shadows.   Subjective:  Chart reviewed, case discussed in multidisciplinary meeting, patient seen during rounds.  Patient is noted to be in the dayroom resting on the table.  He reports that he feels tired.  He offers no complaints.  He continues to state that his mood is doing well.  He denies feeling depressed or anxious.  He denies SI/HI/intent/plan.  He denies auditory/visual hallucinations.  Later in the day he came by and requested Glucerna.  Diabetic educator was reached out and she recommended sandwich.  Glucerna as Glucerna has more carbs.  Provider discussed consulting dietitian to work with the patient.  He was encouraged to look into rehabs or calling shelters.  Sleep: Fair  Appetite:  Fair  Past Psychiatric History: see h&P Family History:  Family History  Problem Relation Age of Onset   Diabetes Mother    Kidney disease Mother    Hyperlipidemia Mother    Hyperlipidemia Father    Diabetes Father    Social History:  Social History   Substance and Sexual Activity  Alcohol Use No   Alcohol/week: 0.0 standard drinks of alcohol     Social History   Substance and Sexual Activity  Drug Use Yes   Types: "Crack" cocaine   Comment: when stressed    Social History   Socioeconomic History   Marital status: Divorced    Spouse name: Not on file   Number of children: 3   Years of education: 12   Highest education level: Not on file  Occupational History    Comment: disabled  Tobacco Use   Smoking status: Every  Day    Current packs/day: 0.25    Average packs/day: 0.3 packs/day for 30.0 years (7.5 ttl pk-yrs)    Types: Cigarettes   Smokeless tobacco: Former    Quit date: 08/15/2015   Tobacco comments:    04/29/16 2  cigs daily, 10/27/17 sometimes < .25 PPD  Vaping Use   Vaping status: Never Used  Substance and Sexual Activity   Alcohol use: No    Alcohol/week: 0.0 standard drinks of alcohol   Drug use: Yes    Types: "Crack" cocaine    Comment: when stressed   Sexual activity: Not on file  Other Topics Concern   Not on file  Social History Narrative   04/29/17   Lives in shelter   Caffeine- a lot of  tea, coffee   Social Drivers of Corporate investment banker Strain: Not on file  Food Insecurity: Food Insecurity Present (05/19/2023)   Hunger Vital Sign    Worried About Running Out of Food in the Last Year: Often true    Ran Out of Food in the Last Year: Often true  Transportation Needs: Unmet Transportation Needs (05/19/2023)   PRAPARE - Administrator, Civil Service (Medical): Yes    Lack of Transportation (Non-Medical): Yes  Physical Activity: Not on file  Stress: Not on file  Social Connections: Not on file   Past Medical History:  Past Medical History:  Diagnosis Date  Angioedema of lips 07/28/2012   left upper (07/29/2012)   Arthritis    hands and back   Chronic back pain    Cocaine abuse (HCC)    Diabetic neuropathy (HCC)    High cholesterol    Hypertension    Neuropathy    Type II diabetes mellitus (HCC)     Past Surgical History:  Procedure Laterality Date   BACK SURGERY     CYSTOSCOPY W/ URETERAL STENT PLACEMENT Right 02/23/2021   Procedure: CYSTOSCOPY WITH RETROGRADE PYELOGRAM/URETERAL STENT PLACEMENT;  Surgeon: Jannifer Hick, MD;  Location: [facility]S;  Service: Urology;  Laterality: Right;   CYSTOSCOPY/URETEROSCOPY/HOLMIUM LASER/STENT PLACEMENT Right 12/05/2022   Procedure: CYSTOSCOPY, RIGHT URETEROSCOPY, AND RIGHT URETERAL STENT PLACEMENT;  Surgeon:  Adonis Brook, MD;  Location: [facility];  Service: Urology;  Laterality: Right;  90 MINUTES   HERNIA REPAIR Right 04/01/2012   I & D EXTREMITY Left 12/13/2012   Procedure: IRRIGATION AND DEBRIDEMENT LEFT THUMB;  Surgeon: Tami Ribas, MD;  Location: [facility] OR;  Service: Orthopedics;  Laterality: Left;   INCISION AND DRAINAGE OF WOUND     boil on back/notes 07/14/2008 (07/29/2012)   INGUINAL HERNIA REPAIR  04/01/2012   Procedure: HERNIA REPAIR INGUINAL ADULT;  Surgeon: Currie Paris, MD;  Location: [facility] OR;  Service: General;  Laterality: Right;   INGUINAL HERNIA REPAIR Left 09/06/2016   Procedure: OPEN REPAIR LEFT INGUINAL HERNIA;  Surgeon: Gaynelle Adu, MD;  Location: [facility] OR;  Service: General;  Laterality: Left;   INSERTION OF MESH Left 09/06/2016   Procedure: INSERTION OF MESH;  Surgeon: Gaynelle Adu, MD;  Location: [facility] OR;  Service: General;  Laterality: Left;   TONSILLECTOMY      Current Medications: Current Facility-Administered Medications  Medication Dose Route Frequency Provider Last Rate Last Admin   acetaminophen (TYLENOL) tablet 650 mg  650 mg Oral Q6H PRN Miller-Almeida, Carlena Hurl, NP       alum & mag hydroxide-simeth (MAALOX/MYLANTA) 200-200-20 MG/5ML suspension 30 mL  30 mL Oral Q4H PRN Miller-Almeida, Carlena Hurl, NP       amLODipine (NORVASC) tablet 10 mg  10 mg Oral Daily Miller-Almeida, Carlena Hurl, NP   10 mg at 05/23/23 0925   atorvastatin (LIPITOR) tablet 40 mg  40 mg Oral Daily Miller-Almeida, Carlena Hurl, NP   40 mg at 05/23/23 1610   cyclobenzaprine (FLEXERIL) tablet 10 mg  10 mg Oral BID PRN Miller-Almeida, Carlena Hurl, NP       DULoxetine (CYMBALTA) DR capsule 60 mg  60 mg Oral Daily Miller-Almeida, Carlena Hurl, NP   60 mg at 05/23/23 0926   gabapentin (NEURONTIN) capsule 300 mg  300 mg Oral QHS Miller-Almeida, Carlena Hurl, NP   300 mg at 05/22/23 2145   glipiZIDE (GLUCOTROL) tablet 10 mg  10 mg Oral QAC breakfast Verner Chol, MD   10 mg at 05/23/23 0800   hydrOXYzine (ATARAX) tablet 25 mg   25 mg Oral TID PRN Donato Heinz, NP   25 mg at 05/19/23 2136   insulin aspart (novoLOG) injection 0-15 Units  0-15 Units Subcutaneous TID WC Miller-Almeida, Carlena Hurl, NP   8 Units at 05/23/23 1235   insulin aspart (novoLOG) injection 0-5 Units  0-5 Units Subcutaneous QHS Miller-Almeida, Carlena Hurl, NP   2 Units at 05/20/23 2157   magnesium hydroxide (MILK OF MAGNESIA) suspension 30 mL  30 mL Oral Daily PRN Miller-Almeida, Carlena Hurl, NP       metFORMIN (GLUCOPHAGE) tablet 1,000 mg  1,000 mg Oral BID WC Miller-Almeida, Carlena Hurl, NP   1,000 mg at 05/23/23 0759   naproxen (NAPROSYN) tablet 250 mg  250 mg Oral BID PRN Miller-Almeida, Carlena Hurl, NP       nicotine (NICODERM CQ - dosed in mg/24 hours) patch 14 mg  14 mg Transdermal Daily Verner Chol, MD   14 mg at 05/23/23 0926   OLANZapine (ZYPREXA) injection 5 mg  5 mg Intramuscular TID PRN Miller-Almeida, Carlena Hurl, NP       OLANZapine zydis (ZYPREXA) disintegrating tablet 5 mg  5 mg Oral TID PRN Miller-Almeida, Carlena Hurl, NP        Lab Results:  Results for orders placed or performed during the hospital encounter of 05/19/23 (from the past 48 hours)  Glucose, capillary     Status: Abnormal   Collection Time: 05/21/23  5:05 PM  Result Value Ref Range   Glucose-Capillary 191 (H) 70 - 99 mg/dL    Comment: Glucose reference range applies only to samples taken after fasting for at least 8 hours.  Glucose, capillary     Status: Abnormal   Collection Time: 05/21/23  7:28 PM  Result Value Ref Range   Glucose-Capillary 127 (H) 70 - 99 mg/dL    Comment: Glucose reference range applies only to samples taken after fasting for at least 8 hours.  Glucose, capillary     Status: Abnormal   Collection Time: 05/21/23  9:10 PM  Result Value Ref Range   Glucose-Capillary 149 (H) 70 - 99 mg/dL    Comment: Glucose reference range applies only to samples taken after fasting for at least 8 hours.  Glucose, capillary     Status: Abnormal   Collection Time:  05/22/23  7:35 AM  Result Value Ref Range   Glucose-Capillary 194 (H) 70 - 99 mg/dL    Comment: Glucose reference range applies only to samples taken after fasting for at least 8 hours.  Glucose, capillary     Status: Abnormal   Collection Time: 05/22/23 11:16 AM  Result Value Ref Range   Glucose-Capillary 153 (H) 70 - 99 mg/dL    Comment: Glucose reference range applies only to samples taken after fasting for at least 8 hours.  Glucose, capillary     Status: None   Collection Time: 05/22/23  3:07 PM  Result Value Ref Range   Glucose-Capillary 79 70 - 99 mg/dL    Comment: Glucose reference range applies only to samples taken after fasting for at least 8 hours.  Glucose, capillary     Status: Abnormal   Collection Time: 05/22/23  4:23 PM  Result Value Ref Range   Glucose-Capillary 178 (H) 70 - 99 mg/dL    Comment: Glucose reference range applies only to samples taken after fasting for at least 8 hours.  Glucose, capillary     Status: Abnormal   Collection Time: 05/22/23  9:27 PM  Result Value Ref Range   Glucose-Capillary 157 (H) 70 - 99 mg/dL    Comment: Glucose reference range applies only to samples taken after fasting for at least 8 hours.  Hemoglobin A1c     Status: Abnormal   Collection Time: 05/23/23  7:16 AM  Result Value Ref Range   Hgb A1c MFr Bld 8.3 (H) 4.8 - 5.6 %    Comment: (NOTE) Pre diabetes:          5.7%-6.4%  Diabetes:              >6.4%  Glycemic  control for   <7.0% adults with diabetes    Mean Plasma Glucose 191.51 mg/dL    Comment: Performed at [facility], [address] [place], [place] [address]  Glucose, capillary     Status: Abnormal   Collection Time: 05/23/23  7:41 AM  Result Value Ref Range   Glucose-Capillary 166 (H) 70 - 99 mg/dL    Comment: Glucose reference range applies only to samples taken after fasting for at least 8 hours.  Glucose, capillary     Status: Abnormal   Collection Time: 05/23/23 11:49 AM  Result Value Ref Range    Glucose-Capillary 288 (H) 70 - 99 mg/dL    Comment: Glucose reference range applies only to samples taken after fasting for at least 8 hours.  Glucose, capillary     Status: Abnormal   Collection Time: 05/23/23  1:47 PM  Result Value Ref Range   Glucose-Capillary 245 (H) 70 - 99 mg/dL    Comment: Glucose reference range applies only to samples taken after fasting for at least 8 hours.    Blood Alcohol level:  Lab Results  Component Value Date   ETH <10 05/19/2023   ETH <10 05/18/2023    Metabolic Disorder Labs: Lab Results  Component Value Date   HGBA1C 8.3 (H) 05/23/2023   MPG 191.51 05/23/2023   MPG 309.18 12/02/2022   Lab Results  Component Value Date   PROLACTIN 6.9 06/19/2022   Lab Results  Component Value Date   CHOL 172 05/18/2023   TRIG 192 (H) 05/18/2023   HDL 45 05/18/2023   CHOLHDL 3.8 05/18/2023   VLDL 38 05/18/2023   LDLCALC 89 05/18/2023   LDLCALC 122 (H) 05/23/2021    Physical Findings: AIMS:  , ,  ,  ,    CIWA:    COWS:      Psychiatric Specialty Exam:  Presentation  General Appearance:  Appropriate for Environment; Casual  Eye Contact: Fair  Speech: Clear and Coherent  Speech Volume: Normal    Mood and Affect  Mood: Anxious  Affect: Appropriate   Thought Process  Thought Processes: Coherent  Descriptions of Associations:Intact  Orientation:Full (Time, Place and Person)  Thought Content:Logical  Hallucinations:Hallucinations: None  Ideas of Reference:None  Suicidal Thoughts:Suicidal Thoughts: No  Homicidal Thoughts:Homicidal Thoughts: No   Sensorium  Memory: Immediate Fair; Recent Fair; Remote Fair  Judgment: Impaired  Insight: Shallow   Executive Functions  Concentration: Fair  Attention Span: Fair  Recall: [employer] of Knowledge: Fair  Language: Fair   Psychomotor Activity  Psychomotor Activity: Psychomotor Activity: Normal  Musculoskeletal: Strength & Muscle Tone: within normal  limits Gait & Station: normal Assets  Assets: Manufacturing systems engineer; Desire for Improvement; Physical Health    Physical Exam: Physical Exam Vitals and nursing note reviewed.  HENT:     Head: Normocephalic.     Nose: Nose normal.     Mouth/Throat:     Mouth: Mucous membranes are moist.  Eyes:     Pupils: Pupils are equal, round, and reactive to light.  Cardiovascular:     Rate and Rhythm: Normal rate.     Pulses: Normal pulses.  Abdominal:     General: Bowel sounds are normal.  Skin:    General: Skin is warm.  Neurological:     General: No focal deficit present.     Mental Status: He is alert.    Review of Systems  Constitutional: Negative.   HENT: Negative.    Eyes: Negative.  Respiratory: Negative.    Cardiovascular: Negative.   Gastrointestinal: Negative.   Skin: Negative.   Neurological: Negative.    Blood pressure 129/88, pulse 78, temperature (!) 97 F (36.1 C), resp. rate 18, height 5\' 8"  (1.727 m), weight 65.8 kg, SpO2 100%. Body mass index is 22.05 kg/m.  Diagnosis: Principal Problem:   MDD (major depressive disorder), severe (HCC)   Diagnosis:  Principal Problem:   MDD (major depressive disorder), severe (HCC) Stimulant Use disorder, severe   Clinical Decision Making: Patient with history of depression and chronic cocaine use with multiple psychosocial stressors including homelessness, having no family support admitted for suicidal ideation with plan.  He is currently admitted for inpatient psychiatric stabilization.   Treatment Plan Summary:   Safety and Monitoring:             -- Voluntary admission to inpatient psychiatric unit for safety, stabilization and treatment             -- Daily contact with patient to assess and evaluate symptoms and progress in treatment             -- Patient's case to be discussed in multi-disciplinary team meeting             -- Observation Level: q15 minute checks             -- Vital signs:  q12 hours              -- Precautions: suicide, elopement, and assault   2. Psychiatric Diagnoses and Treatment:                Continue Cymbalta and Gabapentin Trazodone 50 mg nightly for insomnia -- The risks/benefits/side-effects/alternatives to this medication were discussed in detail with the patient and time was given for questions. The patient consents to medication trial.                -- Metabolic profile and EKG monitoring obtained while on an atypical antipsychotic (BMI: Lipid Panel: HbgA1c: QTc:)              -- Encouraged patient to participate in unit milieu and in scheduled group therapies                            3. Medical Issues Being Addressed:     No urgent medical problems 4. Discharge Planning:              -- Social work and case management to assist with discharge planning and identification of hospital follow-up needs prior to discharge             -- Estimated LOS: 5-7 days             -- Discharge Concerns: Need to establish a safety plan; Medication compliance and effectiveness             -- Discharge Goals: Return home with outpatient referrals follow ups   Physician Treatment Plan for Primary Diagnosis: MDD (major depressive disorder), severe (HCC) Long Term Goal(s): Improvement in symptoms so as ready for discharge   Short Term Goals: Ability to identify changes in lifestyle to reduce recurrence of condition will improve, Ability to verbalize feelings will improve, Ability to disclose and discuss suicidal ideas, Ability to demonstrate self-control will improve, and Ability to identify and develop effective coping behaviors will improve   Physician Treatment Plan for Secondary Diagnosis: Principal Problem:   MDD (major  depressive disorder), severe (HCC)   Long Term Goal(s): Improvement in symptoms so as ready for discharge   Short Term Goals: Ability to identify changes in lifestyle to reduce recurrence of condition will improve, Ability to verbalize feelings will  improve, Ability to disclose and discuss suicidal ideas, Ability to demonstrate self-control will improve, Ability to identify and develop effective coping behaviors will improve, Ability to maintain clinical measurements within normal limits will improve, Compliance with prescribed medications will improve, and Ability to identify triggers associated with substance abuse/mental health issues will improve    Verner Chol, MD 05/23/2023, 4:45 PM

## 2023-05-23 NOTE — Group Note (Signed)
 Date:  05/23/2023 Time:  10:50 AM  Group Topic/Focus:  Overcoming Stress:   The focus of this group is to define stress and help patients assess their triggers.    Participation Level:  Did Not Attend   Ardelle Anton 05/23/2023, 10:50 AM

## 2023-05-23 NOTE — Group Note (Signed)
 Therapy Group Note  Group Topic:Other Neurographic Art Group Date: 05/23/2023 Start Time: 1300 End Time: 1350 Facilitators: Lottie Mussel, OT    Group Description: Group participated with Neurographic art activity, using watercolor paints to facilitate creative expression and meditation/relaxation for each individual.  Incorporated bimanual coordination, mental focus, emotional processing, task/command following and relaxation techniques as appropriate.  Patients engaged socially with therapist and other group participants throughout session. Allowed to ask questions as appropriate, and encouraged to identify ways they could use/share their creations with themselves and others.   Therapeutic Goal(s): Demonstrate ability to independently manipulate utensils required to participate with and complete activity. Demonstrate ability to cognitively focus on task and follow commands necessary for completion. Demonstrate use of art as an outlet for emotional processing and expression. Identify and demonstrate importance of relaxation, neural calming and meditation for improved participation with life groups.   Individual Participation: Did not attend.    Participation Level: Did not attend.   Participation Quality:   Behavior:   Speech/Thought Process:   Affect/Mood:   Insight:   Judgement:   Individualization:   Modes of Intervention:   Patient Response to Interventions:    Plan: Continue to engage patient in OT groups 1-2x/week.  Arman Filter., MPH, MS, OTR/L ascom [phone] 05/23/23, 2:48 PM

## 2023-05-23 NOTE — Plan of Care (Signed)
   Problem: Education: Goal: Utilization of techniques to improve thought processes will improve Outcome: Progressing Goal: Knowledge of the prescribed therapeutic regimen will improve Outcome: Progressing
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
## 2023-05-24 LAB — GLUCOSE, CAPILLARY
Glucose-Capillary: 179 mg/dL — ABNORMAL HIGH (ref 70–99)
Glucose-Capillary: 221 mg/dL — ABNORMAL HIGH (ref 70–99)
Glucose-Capillary: 142 mg/dL — ABNORMAL HIGH (ref 70–99)
Glucose-Capillary: 163 mg/dL — ABNORMAL HIGH (ref 70–99)

## 2023-05-24 NOTE — Plan of Care (Signed)
   Problem: Education: Goal: Utilization of techniques to improve thought processes will improve Outcome: Progressing Goal: Knowledge of the prescribed therapeutic regimen will improve Outcome: Progressing

## 2023-05-24 NOTE — Progress Notes (Signed)
 Patient is pleasant and cooperative.  Denies SI/HI and AVH.  Denies anxiety and depression.  Denies pain.  Reports he slept well.    Declined nicotine patch.  Norvasc held to due BP 112/ 68.  Dr. Shela Commons made aware.  Compliant with other ordered medications.  15 min checks in place for safety.  Patient is present in the milieu.  Appropriate interaction with peers and staff.

## 2023-05-24 NOTE — Progress Notes (Signed)
 [facility] MD Progress Note  05/24/2023 11:44 AM David Wyatt  MRN:  [id number]  David Wyatt 63 year old male presents to [facility] from ED stating "am losing my mind." Patient reports losing his apartment 3 weeks ago and currently is disabled and homeless. Patient endorses substance use of crack cocaine with last use being yesterday. Patient endorse poor sleep, bad appetite with weight loss, poor concentration, Patient denies SI, denies HI Denies AH, endorses VH reporting he sees shadows.   Subjective:  Chart reviewed, case discussed in multidisciplinary meeting, patient seen during rounds.  Patient is noted to be resting in his bed.  He offers no complaints.  He is taking his medications.  He is waiting for the social worker to help him with the placement.  He denies SI/HI/intent/plan.  He denies auditory/visual hallucinations.  Yesterday during treatment team social worker did mention referring him to [facility] since he has Medicaid.   Sleep: Fair  Appetite:  Fair  Past Psychiatric History: see h&P Family History:  Family History  Problem Relation Age of Onset   Diabetes Mother    Kidney disease Mother    Hyperlipidemia Mother    Hyperlipidemia Father    Diabetes Father    Social History:  Social History   Substance and Sexual Activity  Alcohol Use No   Alcohol/week: 0.0 standard drinks of alcohol     Social History   Substance and Sexual Activity  Drug Use Yes   Types: "Crack" cocaine   Comment: when stressed    Social History   Socioeconomic History   Marital status: Divorced    Spouse name: Not on file   Number of children: 3   Years of education: 12   Highest education level: Not on file  Occupational History    Comment: disabled  Tobacco Use   Smoking status: Every Day    Current packs/day: 0.25    Average packs/day: 0.3 packs/day for 30.0 years (7.5 ttl pk-yrs)    Types: Cigarettes   Smokeless tobacco: Former    Quit date: 08/15/2015   Tobacco comments:    04/29/16 2   cigs daily, 10/27/17 sometimes < .25 PPD  Vaping Use   Vaping status: Never Used  Substance and Sexual Activity   Alcohol use: No    Alcohol/week: 0.0 standard drinks of alcohol   Drug use: Yes    Types: "Crack" cocaine    Comment: when stressed   Sexual activity: Not on file  Other Topics Concern   Not on file  Social History Narrative   04/29/17   Lives in shelter   Caffeine- a lot of  tea, coffee   Social Drivers of Corporate investment banker Strain: Not on file  Food Insecurity: Food Insecurity Present (05/19/2023)   Hunger Vital Sign    Worried About Running Out of Food in the Last Year: Often true    Ran Out of Food in the Last Year: Often true  Transportation Needs: Unmet Transportation Needs (05/19/2023)   PRAPARE - Administrator, Civil Service (Medical): Yes    Lack of Transportation (Non-Medical): Yes  Physical Activity: Not on file  Stress: Not on file  Social Connections: Not on file   Past Medical History:  Past Medical History:  Diagnosis Date   Angioedema of lips 07/28/2012   left upper (07/29/2012)   Arthritis    hands and back   Chronic back pain    Cocaine abuse (HCC)    Diabetic neuropathy (HCC)  High cholesterol    Hypertension    Neuropathy    Type II diabetes mellitus (HCC)     Past Surgical History:  Procedure Laterality Date   BACK SURGERY     CYSTOSCOPY W/ URETERAL STENT PLACEMENT Right 02/23/2021   Procedure: CYSTOSCOPY WITH RETROGRADE PYELOGRAM/URETERAL STENT PLACEMENT;  Surgeon: Jannifer Hick, MD;  Location: [facility] ORS;  Service: Urology;  Laterality: Right;   CYSTOSCOPY/URETEROSCOPY/HOLMIUM LASER/STENT PLACEMENT Right 12/05/2022   Procedure: CYSTOSCOPY, RIGHT URETEROSCOPY, AND RIGHT URETERAL STENT PLACEMENT;  Surgeon: Adonis Brook, MD;  Location: [facility];  Service: Urology;  Laterality: Right;  90 MINUTES   HERNIA REPAIR Right 04/01/2012   I & D EXTREMITY Left 12/13/2012   Procedure: IRRIGATION AND DEBRIDEMENT LEFT THUMB;   Surgeon: Tami Ribas, MD;  Location: [facility] OR;  Service: Orthopedics;  Laterality: Left;   INCISION AND DRAINAGE OF WOUND     boil on back/notes 07/14/2008 (07/29/2012)   INGUINAL HERNIA REPAIR  04/01/2012   Procedure: HERNIA REPAIR INGUINAL ADULT;  Surgeon: Currie Paris, MD;  Location: [facility] OR;  Service: General;  Laterality: Right;   INGUINAL HERNIA REPAIR Left 09/06/2016   Procedure: OPEN REPAIR LEFT INGUINAL HERNIA;  Surgeon: Gaynelle Adu, MD;  Location: [facility] OR;  Service: General;  Laterality: Left;   INSERTION OF MESH Left 09/06/2016   Procedure: INSERTION OF MESH;  Surgeon: Gaynelle Adu, MD;  Location: [facility] OR;  Service: General;  Laterality: Left;   TONSILLECTOMY      Current Medications: Current Facility-Administered Medications  Medication Dose Route Frequency Provider Last Rate Last Admin   acetaminophen (TYLENOL) tablet 650 mg  650 mg Oral Q6H PRN Miller-Almeida, Carlena Hurl, NP       alum & mag hydroxide-simeth (MAALOX/MYLANTA) 200-200-20 MG/5ML suspension 30 mL  30 mL Oral Q4H PRN Miller-Almeida, Carlena Hurl, NP       amLODipine (NORVASC) tablet 10 mg  10 mg Oral Daily Miller-Almeida, Carlena Hurl, NP   10 mg at 05/23/23 0925   atorvastatin (LIPITOR) tablet 40 mg  40 mg Oral Daily Miller-Almeida, Carlena Hurl, NP   40 mg at 05/24/23 0940   cyclobenzaprine (FLEXERIL) tablet 10 mg  10 mg Oral BID PRN Miller-Almeida, Carlena Hurl, NP       DULoxetine (CYMBALTA) DR capsule 60 mg  60 mg Oral Daily Miller-Almeida, Carlena Hurl, NP   60 mg at 05/24/23 0941   gabapentin (NEURONTIN) capsule 300 mg  300 mg Oral QHS Miller-Almeida, Carlena Hurl, NP   300 mg at 05/23/23 2123   glipiZIDE (GLUCOTROL) tablet 10 mg  10 mg Oral QAC breakfast Verner Chol, MD   10 mg at 05/24/23 6213   hydrOXYzine (ATARAX) tablet 25 mg  25 mg Oral TID PRN Donato Heinz, NP   25 mg at 05/19/23 2136   insulin aspart (novoLOG) injection 0-15 Units  0-15 Units Subcutaneous TID WC Miller-Almeida, Carlena Hurl, NP   3 Units at 05/24/23 0865    insulin aspart (novoLOG) injection 0-5 Units  0-5 Units Subcutaneous QHS Miller-Almeida, Carlena Hurl, NP   2 Units at 05/23/23 2132   magnesium hydroxide (MILK OF MAGNESIA) suspension 30 mL  30 mL Oral Daily PRN Miller-Almeida, Carlena Hurl, NP       metFORMIN (GLUCOPHAGE) tablet 1,000 mg  1,000 mg Oral BID WC Miller-Almeida, Carlena Hurl, NP   1,000 mg at 05/24/23 7846   naproxen (NAPROSYN) tablet 250 mg  250 mg Oral BID PRN Miller-Almeida, Carlena Hurl, NP  nicotine (NICODERM CQ - dosed in mg/24 hours) patch 14 mg  14 mg Transdermal Daily Verner Chol, MD   14 mg at 05/23/23 0926   OLANZapine (ZYPREXA) injection 5 mg  5 mg Intramuscular TID PRN Miller-Almeida, Carlena Hurl, NP       OLANZapine zydis (ZYPREXA) disintegrating tablet 5 mg  5 mg Oral TID PRN Miller-Almeida, Carlena Hurl, NP       traZODone (DESYREL) tablet 50 mg  50 mg Oral QHS Verner Chol, MD   50 mg at 05/23/23 2123    Lab Results:  Results for orders placed or performed during the hospital encounter of 05/19/23 (from the past 48 hours)  Glucose, capillary     Status: None   Collection Time: 05/22/23  3:07 PM  Result Value Ref Range   Glucose-Capillary 79 70 - 99 mg/dL    Comment: Glucose reference range applies only to samples taken after fasting for at least 8 hours.  Glucose, capillary     Status: Abnormal   Collection Time: 05/22/23  4:23 PM  Result Value Ref Range   Glucose-Capillary 178 (H) 70 - 99 mg/dL    Comment: Glucose reference range applies only to samples taken after fasting for at least 8 hours.  Glucose, capillary     Status: Abnormal   Collection Time: 05/22/23  9:27 PM  Result Value Ref Range   Glucose-Capillary 157 (H) 70 - 99 mg/dL    Comment: Glucose reference range applies only to samples taken after fasting for at least 8 hours.  Hemoglobin A1c     Status: Abnormal   Collection Time: 05/23/23  7:16 AM  Result Value Ref Range   Hgb A1c MFr Bld 8.3 (H) 4.8 - 5.6 %    Comment: (NOTE) Pre diabetes:           5.7%-6.4%  Diabetes:              >6.4%  Glycemic control for   <7.0% adults with diabetes    Mean Plasma Glucose 191.51 mg/dL    Comment: Performed at [facility], [address] [place], [place] [address]  Glucose, capillary     Status: Abnormal   Collection Time: 05/23/23  7:41 AM  Result Value Ref Range   Glucose-Capillary 166 (H) 70 - 99 mg/dL    Comment: Glucose reference range applies only to samples taken after fasting for at least 8 hours.  Glucose, capillary     Status: Abnormal   Collection Time: 05/23/23 11:49 AM  Result Value Ref Range   Glucose-Capillary 288 (H) 70 - 99 mg/dL    Comment: Glucose reference range applies only to samples taken after fasting for at least 8 hours.  Glucose, capillary     Status: Abnormal   Collection Time: 05/23/23  1:47 PM  Result Value Ref Range   Glucose-Capillary 245 (H) 70 - 99 mg/dL    Comment: Glucose reference range applies only to samples taken after fasting for at least 8 hours.  Glucose, capillary     Status: Abnormal   Collection Time: 05/23/23  5:01 PM  Result Value Ref Range   Glucose-Capillary 142 (H) 70 - 99 mg/dL    Comment: Glucose reference range applies only to samples taken after fasting for at least 8 hours.  Glucose, capillary     Status: Abnormal   Collection Time: 05/23/23  8:27 PM  Result Value Ref Range   Glucose-Capillary 205 (H) 70 - 99 mg/dL  Comment: Glucose reference range applies only to samples taken after fasting for at least 8 hours.  Glucose, capillary     Status: Abnormal   Collection Time: 05/24/23  7:56 AM  Result Value Ref Range   Glucose-Capillary 179 (H) 70 - 99 mg/dL    Comment: Glucose reference range applies only to samples taken after fasting for at least 8 hours.    Blood Alcohol level:  Lab Results  Component Value Date   ETH <10 05/19/2023   ETH <10 05/18/2023    Metabolic Disorder Labs: Lab Results  Component Value Date   HGBA1C 8.3 (H) 05/23/2023   MPG 191.51  05/23/2023   MPG 309.18 12/02/2022   Lab Results  Component Value Date   PROLACTIN 6.9 06/19/2022   Lab Results  Component Value Date   CHOL 172 05/18/2023   TRIG 192 (H) 05/18/2023   HDL 45 05/18/2023   CHOLHDL 3.8 05/18/2023   VLDL 38 05/18/2023   LDLCALC 89 05/18/2023   LDLCALC 122 (H) 05/23/2021    Physical Findings: AIMS:  , ,  ,  ,    CIWA:    COWS:      Psychiatric Specialty Exam:  Presentation  General Appearance:  Appropriate for Environment; Casual  Eye Contact: Fair  Speech: Clear and Coherent  Speech Volume: Normal    Mood and Affect  Mood: Dysphoric  Affect: Appropriate   Thought Process  Thought Processes: Coherent  Descriptions of Associations:Intact  Orientation:Full (Time, Place and Person)  Thought Content:Logical  Hallucinations:Hallucinations: None  Ideas of Reference:None  Suicidal Thoughts:Suicidal Thoughts: No  Homicidal Thoughts:Homicidal Thoughts: No   Sensorium  Memory: Immediate Fair; Recent Fair; Remote Fair  Judgment: Impaired  Insight: None   Executive Functions  Concentration: Fair  Attention Span: Fair  Recall: [employer] of Knowledge: Fair  Language: Fair   Psychomotor Activity  Psychomotor Activity: Psychomotor Activity: Normal  Musculoskeletal: Strength & Muscle Tone: within normal limits Gait & Station: normal Assets  Assets: Manufacturing systems engineer; Desire for Improvement; Physical Health    Physical Exam: Physical Exam Vitals and nursing note reviewed.  HENT:     Head: Normocephalic.     Nose: Nose normal.     Mouth/Throat:     Mouth: Mucous membranes are moist.  Eyes:     Pupils: Pupils are equal, round, and reactive to light.  Cardiovascular:     Rate and Rhythm: Normal rate.     Pulses: Normal pulses.  Abdominal:     General: Bowel sounds are normal.  Skin:    General: Skin is warm.  Neurological:     General: No focal deficit present.     Mental  Status: He is alert.    Review of Systems  Constitutional: Negative.   HENT: Negative.    Eyes: Negative.   Respiratory: Negative.    Cardiovascular: Negative.   Gastrointestinal: Negative.   Skin: Negative.   Neurological: Negative.    Blood pressure 112/68, pulse 80, temperature (!) 97.5 F (36.4 C), resp. rate 18, height 5\' 8"  (1.727 m), weight 65.8 kg, SpO2 100%. Body mass index is 22.05 kg/m.  Diagnosis: Principal Problem:   MDD (major depressive disorder), severe (HCC)   Diagnosis:  Principal Problem:   MDD (major depressive disorder), severe (HCC) Stimulant Use disorder, severe   Clinical Decision Making: Patient with history of depression and chronic cocaine use with multiple psychosocial stressors including homelessness, having no family support admitted for suicidal ideation with plan.  He is  currently admitted for inpatient psychiatric stabilization.   Treatment Plan Summary:   Safety and Monitoring:             -- Voluntary admission to inpatient psychiatric unit for safety, stabilization and treatment             -- Daily contact with patient to assess and evaluate symptoms and progress in treatment             -- Patient's case to be discussed in multi-disciplinary team meeting             -- Observation Level: q15 minute checks             -- Vital signs:  q12 hours             -- Precautions: suicide, elopement, and assault   2. Psychiatric Diagnoses and Treatment:                Continue Cymbalta and Gabapentin Trazodone 50 mg nightly for insomnia -- The risks/benefits/side-effects/alternatives to this medication were discussed in detail with the patient and time was given for questions. The patient consents to medication trial.                -- Metabolic profile and EKG monitoring obtained while on an atypical antipsychotic (BMI: Lipid Panel: HbgA1c: QTc:)              -- Encouraged patient to participate in unit milieu and in scheduled group therapies                             3. Medical Issues Being Addressed:     No urgent medical problems 4. Discharge Planning:              -- Social work and case management to assist with discharge planning and identification of hospital follow-up needs prior to discharge             -- Estimated LOS: 5-7 days             -- Discharge Concerns: Need to establish a safety plan; Medication compliance and effectiveness             -- Discharge Goals: Return home with outpatient referrals follow ups   Physician Treatment Plan for Primary Diagnosis: MDD (major depressive disorder), severe (HCC) Long Term Goal(s): Improvement in symptoms so as ready for discharge   Short Term Goals: Ability to identify changes in lifestyle to reduce recurrence of condition will improve, Ability to verbalize feelings will improve, Ability to disclose and discuss suicidal ideas, Ability to demonstrate self-control will improve, and Ability to identify and develop effective coping behaviors will improve   Physician Treatment Plan for Secondary Diagnosis: Principal Problem:   MDD (major depressive disorder), severe (HCC)   Long Term Goal(s): Improvement in symptoms so as ready for discharge   Short Term Goals: Ability to identify changes in lifestyle to reduce recurrence of condition will improve, Ability to verbalize feelings will improve, Ability to disclose and discuss suicidal ideas, Ability to demonstrate self-control will improve, Ability to identify and develop effective coping behaviors will improve, Ability to maintain clinical measurements within normal limits will improve, Compliance with prescribed medications will improve, and Ability to identify triggers associated with substance abuse/mental health issues will improve    Verner Chol, MD 05/24/2023, 11:44 AM

## 2023-05-25 LAB — GLUCOSE, CAPILLARY
Glucose-Capillary: 255 mg/dL — ABNORMAL HIGH (ref 70–99)
Glucose-Capillary: 189 mg/dL — ABNORMAL HIGH (ref 70–99)
Glucose-Capillary: 211 mg/dL — ABNORMAL HIGH (ref 70–99)
Glucose-Capillary: 189 mg/dL — ABNORMAL HIGH (ref 70–99)

## 2023-05-25 NOTE — Plan of Care (Signed)
  Problem: Education: Goal: Knowledge of the prescribed therapeutic regimen will improve Outcome: Progressing   Problem: Education: Goal: Emotional status will improve Outcome: Progressing Goal: Mental status will improve Outcome: Progressing   

## 2023-05-25 NOTE — Plan of Care (Signed)
  Problem: Education: Goal: Utilization of techniques to improve thought processes will improve Outcome: Progressing   

## 2023-05-25 NOTE — Group Note (Signed)
 Date:  05/25/2023 Time:  9:28 PM  Group Topic/Focus:  Wrap-Up Group:   The focus of this group is to help patients review their daily goal of treatment and discuss progress on daily workbooks.    Participation Level:  Minimal  Participation Quality:  Resistant  Affect:  Appropriate  Cognitive:  Appropriate  Insight: None  Engagement in Group:  Resistant  Modes of Intervention:  Discussion  Additional Comments:    David Wyatt David Wyatt 05/25/2023, 9:28 PM

## 2023-05-25 NOTE — Progress Notes (Signed)
 D: Pt alert and oriented. Pt rates depression 0/10 and anxiety 4/10.  Pt denies experiencing any SI/HI, or AVH at this time.   A: Scheduled medications administered to pt, per MD orders. Support and encouragement provided. Frequent verbal contact made. Routine safety checks conducted q15 minutes.   R: No adverse drug reactions noted. Pt verbally contracts for safety at this time. Pt complaint with medications and treatment plan. Pt interacts well with others on the unit. Pt remains safe at this time. Will continue to monitor.

## 2023-05-25 NOTE — Progress Notes (Signed)
 Patient pleasant and cooperative.  Sad affect.  Denies SI/HI and AVH.  Denies anxiety and depression.  Denies pain.  Reports he slept well.    Compliant with scheduled medications.  15 min checks in lace for safety.  Patient is present in the milieu.  Appropriate interaction with peers.  Patient asking for snacks throughout shift. Dr Shela Commons told this writer snacks (crackers and peanut butter) were okay to give.

## 2023-05-25 NOTE — Progress Notes (Signed)
 [facility] MD Progress Note  05/25/2023 11:42 AM David Wyatt  MRN:  [id number]  [name] 63 year old male presents to [facility] from ED stating "am losing my mind." Patient reports losing his apartment 3 weeks ago and currently is disabled and homeless. Patient endorses substance use of crack cocaine with last use being yesterday. Patient endorse poor sleep, bad appetite with weight loss, poor concentration, Patient denies SI, denies HI Denies AH, endorses VH reporting he sees shadows.   Subjective:  Chart reviewed, case discussed in multidisciplinary meeting, patient seen during rounds.  Patient offers no complaints.  He is noted to be sitting in the dayroom and reports feeling dizzy.  His sugars are above 150 after his insulin.  Patient was offered peanut butter cookies and some drink.  He denies feeling depressed or anxious.  He denies SI/HI/intent/plan.  He denies auditory/visual hallucinations.  He is requesting the social work team to help him with rehab   Sleep: Fair  Appetite:  Fair  Past Psychiatric History: see h&P Family History:  Family History  Problem Relation Age of Onset   Diabetes Mother    Kidney disease Mother    Hyperlipidemia Mother    Hyperlipidemia Father    Diabetes Father    Social History:  Social History   Substance and Sexual Activity  Alcohol Use No   Alcohol/week: 0.0 standard drinks of alcohol     Social History   Substance and Sexual Activity  Drug Use Yes   Types: "Crack" cocaine   Comment: when stressed    Social History   Socioeconomic History   Marital status: Divorced    Spouse name: Not on file   Number of children: 3   Years of education: 12   Highest education level: Not on file  Occupational History    Comment: disabled  Tobacco Use   Smoking status: Every Day    Current packs/day: 0.25    Average packs/day: 0.3 packs/day for 30.0 years (7.5 ttl pk-yrs)    Types: Cigarettes   Smokeless tobacco: Former    Quit date: 08/15/2015    Tobacco comments:    04/29/16 2  cigs daily, 10/27/17 sometimes < .25 PPD  Vaping Use   Vaping status: Never Used  Substance and Sexual Activity   Alcohol use: No    Alcohol/week: 0.0 standard drinks of alcohol   Drug use: Yes    Types: "Crack" cocaine    Comment: when stressed   Sexual activity: Not on file  Other Topics Concern   Not on file  Social History Narrative   04/29/17   Lives in shelter   Caffeine- a lot of  tea, coffee   Social Drivers of Corporate investment banker Strain: Not on file  Food Insecurity: Food Insecurity Present (05/19/2023)   Hunger Vital Sign    Worried About Running Out of Food in the Last Year: Often true    Ran Out of Food in the Last Year: Often true  Transportation Needs: Unmet Transportation Needs (05/19/2023)   PRAPARE - Administrator, Civil Service (Medical): Yes    Lack of Transportation (Non-Medical): Yes  Physical Activity: Not on file  Stress: Not on file  Social Connections: Not on file   Past Medical History:  Past Medical History:  Diagnosis Date   Angioedema of lips 07/28/2012   left upper (07/29/2012)   Arthritis    hands and back   Chronic back pain    Cocaine abuse (HCC)  Diabetic neuropathy (HCC)    High cholesterol    Hypertension    Neuropathy    Type II diabetes mellitus (HCC)     Past Surgical History:  Procedure Laterality Date   BACK SURGERY     CYSTOSCOPY W/ URETERAL STENT PLACEMENT Right 02/23/2021   Procedure: CYSTOSCOPY WITH RETROGRADE PYELOGRAM/URETERAL STENT PLACEMENT;  Surgeon: Jannifer Hick, MD;  Location: [facility] ORS;  Service: Urology;  Laterality: Right;   CYSTOSCOPY/URETEROSCOPY/HOLMIUM LASER/STENT PLACEMENT Right 12/05/2022   Procedure: CYSTOSCOPY, RIGHT URETEROSCOPY, AND RIGHT URETERAL STENT PLACEMENT;  Surgeon: Adonis Brook, MD;  Location: [facility];  Service: Urology;  Laterality: Right;  90 MINUTES   HERNIA REPAIR Right 04/01/2012   I & D EXTREMITY Left 12/13/2012   Procedure:  IRRIGATION AND DEBRIDEMENT LEFT THUMB;  Surgeon: Tami Ribas, MD;  Location: [facility] OR;  Service: Orthopedics;  Laterality: Left;   INCISION AND DRAINAGE OF WOUND     boil on back/notes 07/14/2008 (07/29/2012)   INGUINAL HERNIA REPAIR  04/01/2012   Procedure: HERNIA REPAIR INGUINAL ADULT;  Surgeon: Currie Paris, MD;  Location: [facility] OR;  Service: General;  Laterality: Right;   INGUINAL HERNIA REPAIR Left 09/06/2016   Procedure: OPEN REPAIR LEFT INGUINAL HERNIA;  Surgeon: Gaynelle Adu, MD;  Location: [facility] OR;  Service: General;  Laterality: Left;   INSERTION OF MESH Left 09/06/2016   Procedure: INSERTION OF MESH;  Surgeon: Gaynelle Adu, MD;  Location: [facility] OR;  Service: General;  Laterality: Left;   TONSILLECTOMY      Current Medications: Current Facility-Administered Medications  Medication Dose Route Frequency Provider Last Rate Last Admin   acetaminophen (TYLENOL) tablet 650 mg  650 mg Oral Q6H PRN Miller-Almeida, Carlena Hurl, NP       alum & mag hydroxide-simeth (MAALOX/MYLANTA) 200-200-20 MG/5ML suspension 30 mL  30 mL Oral Q4H PRN Miller-Almeida, Carlena Hurl, NP       amLODipine (NORVASC) tablet 10 mg  10 mg Oral Daily Miller-Almeida, Carlena Hurl, NP   10 mg at 05/25/23 0918   atorvastatin (LIPITOR) tablet 40 mg  40 mg Oral Daily Miller-Almeida, Carlena Hurl, NP   40 mg at 05/25/23 3086   cyclobenzaprine (FLEXERIL) tablet 10 mg  10 mg Oral BID PRN Miller-Almeida, Carlena Hurl, NP       DULoxetine (CYMBALTA) DR capsule 60 mg  60 mg Oral Daily Miller-Almeida, Carlena Hurl, NP   60 mg at 05/25/23 5784   gabapentin (NEURONTIN) capsule 300 mg  300 mg Oral QHS Miller-Almeida, Carlena Hurl, NP   300 mg at 05/24/23 2130   glipiZIDE (GLUCOTROL) tablet 10 mg  10 mg Oral QAC breakfast Verner Chol, MD   10 mg at 05/25/23 6962   hydrOXYzine (ATARAX) tablet 25 mg  25 mg Oral TID PRN Donato Heinz, NP   25 mg at 05/19/23 2136   insulin aspart (novoLOG) injection 0-15 Units  0-15 Units Subcutaneous TID WC Miller-Almeida, Carlena Hurl,  NP   8 Units at 05/25/23 0742   insulin aspart (novoLOG) injection 0-5 Units  0-5 Units Subcutaneous QHS Miller-Almeida, Carlena Hurl, NP   2 Units at 05/23/23 2132   magnesium hydroxide (MILK OF MAGNESIA) suspension 30 mL  30 mL Oral Daily PRN Miller-Almeida, Carlena Hurl, NP       metFORMIN (GLUCOPHAGE) tablet 1,000 mg  1,000 mg Oral BID WC Miller-Almeida, Carlena Hurl, NP   1,000 mg at 05/25/23 0917   naproxen (NAPROSYN) tablet 250 mg  250 mg Oral BID PRN Donato Heinz,  NP       nicotine (NICODERM CQ - dosed in mg/24 hours) patch 14 mg  14 mg Transdermal Daily Verner Chol, MD   14 mg at 05/25/23 0931   OLANZapine (ZYPREXA) injection 5 mg  5 mg Intramuscular TID PRN Miller-Almeida, Carlena Hurl, NP       OLANZapine zydis (ZYPREXA) disintegrating tablet 5 mg  5 mg Oral TID PRN Miller-Almeida, Carlena Hurl, NP       traZODone (DESYREL) tablet 50 mg  50 mg Oral QHS Verner Chol, MD   50 mg at 05/24/23 2130    Lab Results:  Results for orders placed or performed during the hospital encounter of 05/19/23 (from the past 48 hours)  Glucose, capillary     Status: Abnormal   Collection Time: 05/23/23 11:49 AM  Result Value Ref Range   Glucose-Capillary 288 (H) 70 - 99 mg/dL    Comment: Glucose reference range applies only to samples taken after fasting for at least 8 hours.  Glucose, capillary     Status: Abnormal   Collection Time: 05/23/23  1:47 PM  Result Value Ref Range   Glucose-Capillary 245 (H) 70 - 99 mg/dL    Comment: Glucose reference range applies only to samples taken after fasting for at least 8 hours.  Glucose, capillary     Status: Abnormal   Collection Time: 05/23/23  5:01 PM  Result Value Ref Range   Glucose-Capillary 142 (H) 70 - 99 mg/dL    Comment: Glucose reference range applies only to samples taken after fasting for at least 8 hours.  Glucose, capillary     Status: Abnormal   Collection Time: 05/23/23  8:27 PM  Result Value Ref Range   Glucose-Capillary 205 (H) 70 - 99 mg/dL     Comment: Glucose reference range applies only to samples taken after fasting for at least 8 hours.  Glucose, capillary     Status: Abnormal   Collection Time: 05/24/23  7:56 AM  Result Value Ref Range   Glucose-Capillary 179 (H) 70 - 99 mg/dL    Comment: Glucose reference range applies only to samples taken after fasting for at least 8 hours.  Glucose, capillary     Status: Abnormal   Collection Time: 05/24/23 11:48 AM  Result Value Ref Range   Glucose-Capillary 221 (H) 70 - 99 mg/dL    Comment: Glucose reference range applies only to samples taken after fasting for at least 8 hours.  Glucose, capillary     Status: Abnormal   Collection Time: 05/24/23  2:53 PM  Result Value Ref Range   Glucose-Capillary 142 (H) 70 - 99 mg/dL    Comment: Glucose reference range applies only to samples taken after fasting for at least 8 hours.  Glucose, capillary     Status: Abnormal   Collection Time: 05/24/23  5:05 PM  Result Value Ref Range   Glucose-Capillary 163 (H) 70 - 99 mg/dL    Comment: Glucose reference range applies only to samples taken after fasting for at least 8 hours.  Glucose, capillary     Status: Abnormal   Collection Time: 05/25/23  7:39 AM  Result Value Ref Range   Glucose-Capillary 255 (H) 70 - 99 mg/dL    Comment: Glucose reference range applies only to samples taken after fasting for at least 8 hours.  Glucose, capillary     Status: Abnormal   Collection Time: 05/25/23 10:40 AM  Result Value Ref Range   Glucose-Capillary 189 (H) 70 - 99  mg/dL    Comment: Glucose reference range applies only to samples taken after fasting for at least 8 hours.    Blood Alcohol level:  Lab Results  Component Value Date   ETH <10 05/19/2023   ETH <10 05/18/2023    Metabolic Disorder Labs: Lab Results  Component Value Date   HGBA1C 8.3 (H) 05/23/2023   MPG 191.51 05/23/2023   MPG 309.18 12/02/2022   Lab Results  Component Value Date   PROLACTIN 6.9 06/19/2022   Lab Results   Component Value Date   CHOL 172 05/18/2023   TRIG 192 (H) 05/18/2023   HDL 45 05/18/2023   CHOLHDL 3.8 05/18/2023   VLDL 38 05/18/2023   LDLCALC 89 05/18/2023   LDLCALC 122 (H) 05/23/2021    Physical Findings: AIMS:  , ,  ,  ,    CIWA:    COWS:      Psychiatric Specialty Exam:  Presentation  General Appearance:  Appropriate for Environment; Casual  Eye Contact: Fair  Speech: Clear and Coherent  Speech Volume: Normal    Mood and Affect  Mood: Depressed  Affect: Depressed   Thought Process  Thought Processes: Coherent  Descriptions of Associations:Intact  Orientation:Full (Time, Place and Person)  Thought Content:Logical  Hallucinations:Hallucinations: None  Ideas of Reference:None  Suicidal Thoughts:Suicidal Thoughts: No  Homicidal Thoughts:Homicidal Thoughts: No   Sensorium  Memory: Immediate Fair; Recent Fair; Remote Fair  Judgment: Impaired  Insight: Shallow   Executive Functions  Concentration: Fair  Attention Span: Fair  Recall: [employer] of Knowledge: Fair  Language: Fair   Psychomotor Activity  Psychomotor Activity: Psychomotor Activity: Normal  Musculoskeletal: Strength & Muscle Tone: within normal limits Gait & Station: normal Assets  Assets: Manufacturing systems engineer; Desire for Improvement; Resilience    Physical Exam: Physical Exam Vitals and nursing note reviewed.  HENT:     Head: Normocephalic.     Nose: Nose normal.     Mouth/Throat:     Mouth: Mucous membranes are moist.  Eyes:     Pupils: Pupils are equal, round, and reactive to light.  Cardiovascular:     Rate and Rhythm: Normal rate.     Pulses: Normal pulses.  Abdominal:     General: Bowel sounds are normal.  Skin:    General: Skin is warm.  Neurological:     General: No focal deficit present.     Mental Status: He is alert.    Review of Systems  Constitutional: Negative.   HENT: Negative.    Eyes: Negative.   Respiratory:  Negative.    Cardiovascular: Negative.   Gastrointestinal: Negative.   Skin: Negative.   Neurological: Negative.    Blood pressure 120/67, pulse 82, temperature (!) 97.4 F (36.3 C), resp. rate 18, height 5\' 8"  (1.727 m), weight 65.8 kg, SpO2 99%. Body mass index is 22.05 kg/m.  Diagnosis: Principal Problem:   MDD (major depressive disorder), severe (HCC)   Diagnosis:  Principal Problem:   MDD (major depressive disorder), severe (HCC) Stimulant Use disorder, severe   Clinical Decision Making: Patient with history of depression and chronic cocaine use with multiple psychosocial stressors including homelessness, having no family support admitted for suicidal ideation with plan.  He is currently admitted for inpatient psychiatric stabilization.   Treatment Plan Summary:   Safety and Monitoring:             -- Voluntary admission to inpatient psychiatric unit for safety, stabilization and treatment             --  Daily contact with patient to assess and evaluate symptoms and progress in treatment             -- Patient's case to be discussed in multi-disciplinary team meeting             -- Observation Level: q15 minute checks             -- Vital signs:  q12 hours             -- Precautions: suicide, elopement, and assault   2. Psychiatric Diagnoses and Treatment:                Continue Cymbalta and Gabapentin Trazodone 50 mg nightly for insomnia -- The risks/benefits/side-effects/alternatives to this medication were discussed in detail with the patient and time was given for questions. The patient consents to medication trial.                -- Metabolic profile and EKG monitoring obtained while on an atypical antipsychotic (BMI: Lipid Panel: HbgA1c: QTc:)              -- Encouraged patient to participate in unit milieu and in scheduled group therapies                            3. Medical Issues Being Addressed:     No urgent medical problems 4. Discharge Planning:               -- Social work and case management to assist with discharge planning and identification of hospital follow-up needs prior to discharge             -- Estimated LOS: 5-7 days             -- Discharge Concerns: Need to establish a safety plan; Medication compliance and effectiveness             -- Discharge Goals: Return home with outpatient referrals follow ups   Physician Treatment Plan for Primary Diagnosis: MDD (major depressive disorder), severe (HCC) Long Term Goal(s): Improvement in symptoms so as ready for discharge   Short Term Goals: Ability to identify changes in lifestyle to reduce recurrence of condition will improve, Ability to verbalize feelings will improve, Ability to disclose and discuss suicidal ideas, Ability to demonstrate self-control will improve, and Ability to identify and develop effective coping behaviors will improve   Physician Treatment Plan for Secondary Diagnosis: Principal Problem:   MDD (major depressive disorder), severe (HCC)   Long Term Goal(s): Improvement in symptoms so as ready for discharge   Short Term Goals: Ability to identify changes in lifestyle to reduce recurrence of condition will improve, Ability to verbalize feelings will improve, Ability to disclose and discuss suicidal ideas, Ability to demonstrate self-control will improve, Ability to identify and develop effective coping behaviors will improve, Ability to maintain clinical measurements within normal limits will improve, Compliance with prescribed medications will improve, and Ability to identify triggers associated with substance abuse/mental health issues will improve    Verner Chol, MD 05/25/2023, 11:42 AM

## 2023-05-25 NOTE — Progress Notes (Signed)
   05/25/23 1850  Spiritual Encounters  Type of Visit Initial  Care provided to: Patient  Referral source Nurse (RN/NT/LPN)  Reason for visit Routine spiritual support  OnCall Visit Yes  Spiritual Framework  Presenting Themes Meaning/purpose/sources of inspiration  Interventions  Spiritual Care Interventions Made Established relationship of care and support;Compassionate presence;Prayer  Intervention Outcomes  Outcomes Connection to spiritual care;Awareness around self/spiritual resourses  Spiritual Care Plan  Spiritual Care Issues Still Outstanding No further spiritual care needs at this time (see row info)   Chaplain provided a bible and prayer at the request of the patient

## 2023-05-25 NOTE — Group Note (Signed)
 Date:  05/25/2023 Time:  3:03 PM  Group Topic/Focus:  Outside Rec/Group Movement The purpose of this group is to allow patients to go outside and get fresh air while listening to relaxing music.   Participation Level:  Minimal  Participation Quality:  Sharing  Affect:  Appropriate  Cognitive:  Appropriate  Insight: Appropriate  Engagement in Group:  Engaged  Modes of Intervention:  Activity  Additional Comments:    Marta Antu 05/25/2023, 3:03 PM

## 2023-05-26 DIAGNOSIS — F322 Major depressive disorder, single episode, severe without psychotic features: Secondary | ICD-10-CM | POA: Diagnosis not present

## 2023-05-26 LAB — GLUCOSE, CAPILLARY
Glucose-Capillary: 146 mg/dL — ABNORMAL HIGH (ref 70–99)
Glucose-Capillary: 174 mg/dL — ABNORMAL HIGH (ref 70–99)
Glucose-Capillary: 137 mg/dL — ABNORMAL HIGH (ref 70–99)
Glucose-Capillary: 195 mg/dL — ABNORMAL HIGH (ref 70–99)
Glucose-Capillary: 149 mg/dL — ABNORMAL HIGH (ref 70–99)
Glucose-Capillary: 155 mg/dL — ABNORMAL HIGH (ref 70–99)

## 2023-05-26 MED ORDER — LOPERAMIDE HCL 2 MG PO CAPS
2.0000 mg | ORAL_CAPSULE | ORAL | Status: DC | PRN
Start: 2023-05-26 — End: 2023-05-28
  Administered 2023-05-26 – 2023-05-27 (×2): 2 mg via ORAL
  Filled 2023-05-26 (×2): qty 1

## 2023-05-26 MED ORDER — ADULT MULTIVITAMIN W/MINERALS CH
1.0000 | ORAL_TABLET | Freq: Every day | ORAL | Status: DC
  Administered 2023-05-26 – 2023-05-28 (×3): 1 via ORAL
  Filled 2023-05-26 (×3): qty 1

## 2023-05-26 NOTE — Progress Notes (Signed)
 NUTRITION ASSESSMENT  Pt identified as at risk on the Malnutrition Screen Tool  INTERVENTION:  -Continue carb modified diet -Double protein portions with meals -Magic cup BID with meals, each supplement provides 290 kcal and 9 grams of protein  -MVI with minerals daily  NUTRITION DIAGNOSIS: Moderate malnutrition related to social and environmental circumstances as evidenced by mild to moderate fat and muscle depletions, % wt loss.   Goal: Pt to meet >/= 90% of their estimated nutrition needs.  Monitor:  PO intake  Assessment:  Pt presented with suicidal ideation, cocaine use, and homelessness.He has a history of chronic homelessness and states he has not had a place to stay for the past 2-3 weeks PTA. He has a documented history of major depressive disorder and history of substance use.   Pt admitted with suicidal ideation.   Case discussed with nurse tech, who reports pt is pleasant and eating well.   Spoke with pt at bedside, who was pleasant and in good spirits today. He reports eating well, however, complains of diarrhea today (had constipation earlier). Pt typically has no problems moving his bowels.   Per pt, he has no teeth and has access to dentures, but not here. Pt denies any difficulty eating food here and politely declined offer for mechanically altered diet. Per pt, he reports feeling tired of the foods offered on the menu. He reports he has been eating a lot of hamburgers and french fries. Pt also questioning when he will get snacks. PTA he consumed 2-3 meals per day.  Pt appreciates concern about his blood sugars as DM. Discussed how DM is managed in the hospital vs outpatient. Reviewed hospital menu and discussed interventions to help with with satiety and blood sugar control. Pt very appreciative of visit.   Reviewed wt hx; pt has experienced a 8.1% wt loss over the past 3 months, which is significant for time frame. Suspect pt social situation as well as  uncontrolled DM is contributing to weight loss.   Medications reviewed and include neurontin.   Lab Results  Component Value Date   HGBA1C 8.3 (H) 05/23/2023   PTA DM medications are 1000 mg metformin BID and 1.5 mg trulicity weekly. PTA he reports good CBG control with trulicity ("it was never above 120").   Labs reviewed: CBGS: 142-205 (inpatient orders for glycemic control are 10 mg glipizide daily, 0-15 units insulin aspart TID with meals, 0-5 units insulin aspart daily at bedtime, and 1000 mg metformin BID). Tox screen positive for cocaine.   Flowsheet Row Most Recent Value  Orbital Region Moderate depletion  Upper Arm Region Mild depletion  Thoracic and Lumbar Region Mild depletion  Buccal Region Mild depletion  Temple Region Severe depletion  Clavicle Bone Region Mild depletion  Clavicle and Acromion Bone Region Mild depletion  Scapular Bone Region Mild depletion  Dorsal Hand No depletion  Patellar Region Mild depletion  Anterior Thigh Region Mild depletion  Posterior Calf Region Mild depletion  Edema (RD Assessment) None  Hair Reviewed  Eyes Reviewed  Mouth Reviewed  Skin Reviewed  Nails Reviewed        64 y.o. male  Height: Ht Readings from Last 1 Encounters:  05/19/23 5\' 8"  (1.727 m)    Weight: Wt Readings from Last 1 Encounters:  05/19/23 65.8 kg    Weight Hx: Wt Readings from Last 10 Encounters:  05/19/23 65.8 kg  05/18/23 74.8 kg  04/08/23 71.5 kg  04/01/23 71 kg  02/20/23 71.6 kg  12/25/22 72.6  kg  12/05/22 68.9 kg  12/02/22 68.9 kg  09/17/22 70.9 kg  09/05/22 75 kg    BMI:  Body mass index is 22.05 kg/m. BMI WDL.   Estimated Nutritional Needs: Kcal: 25-30 kcal/kg Protein: > 1 gram protein/kg Fluid: 1 ml/kcal  Diet Order:  Diet Order             Diet Carb Modified Fluid consistency: Thin; Room service appropriate? Yes  Diet effective now                  Pt is also offered choice of unit snacks mid-morning and  mid-afternoon.  Pt is eating as desired.   Lab results and medications reviewed.   Levada Schilling, RD, LDN, CDCES Registered Dietitian III Certified Diabetes Care and Education Specialist If unable to reach this RD, please use "RD Inpatient" group chat on secure chat between hours of 8am-4 pm daily

## 2023-05-26 NOTE — Group Note (Signed)
 Date:  05/26/2023 Time:  11:31 PM  Group Topic/Focus:  Wrap-Up Group:   The focus of this group is to help patients review their daily goal of treatment and discuss progress on daily workbooks.    Participation Level:  Did Not Attend  Participation Quality:      Affect:      Cognitive:      Insight: None  Engagement in Group:  None  Modes of Intervention:  Discussion  Additional Comments:    Maeola Harman 05/26/2023, 11:31 PM

## 2023-05-26 NOTE — Progress Notes (Signed)
   05/26/23 0743  Psych Admission Type (Psych Patients Only)  Admission Status Voluntary  Psychosocial Assessment  Patient Complaints Malaise  Eye Contact Fair  Facial Expression Anxious;Sad;Sullen  Affect Sad  Speech Logical/coherent  Interaction Assertive  Motor Activity Slow  Appearance/Hygiene In scrubs  Behavior Characteristics Cooperative  Mood Pleasant  Thought Process  Coherency WDL  Content WDL  Delusions None reported or observed  Perception WDL  Hallucination None reported or observed  Judgment Impaired  Confusion Mild  Danger to Self  Current suicidal ideation? Denies  Danger to Others  Danger to Others None reported or observed

## 2023-05-26 NOTE — BH IP Treatment Plan (Signed)
 Interdisciplinary Treatment and Diagnostic Plan Update  05/26/2023 Time of Session: 9:00 AM  David Wyatt MRN: [id number]  Principal Diagnosis: MDD (major depressive disorder), severe (HCC)  Secondary Diagnoses: Principal Problem:   MDD (major depressive disorder), severe (HCC)   Current Medications:  Current Facility-Administered Medications  Medication Dose Route Frequency Provider Last Rate Last Admin   acetaminophen (TYLENOL) tablet 650 mg  650 mg Oral Q6H PRN Miller-Almeida, Carlena Hurl, NP       alum & mag hydroxide-simeth (MAALOX/MYLANTA) 200-200-20 MG/5ML suspension 30 mL  30 mL Oral Q4H PRN Miller-Almeida, Carlena Hurl, NP       amLODipine (NORVASC) tablet 10 mg  10 mg Oral Daily Miller-Almeida, Carlena Hurl, NP   10 mg at 05/26/23 0911   atorvastatin (LIPITOR) tablet 40 mg  40 mg Oral Daily Miller-Almeida, Carlena Hurl, NP   40 mg at 05/26/23 0911   cyclobenzaprine (FLEXERIL) tablet 10 mg  10 mg Oral BID PRN Miller-Almeida, Carlena Hurl, NP       DULoxetine (CYMBALTA) DR capsule 60 mg  60 mg Oral Daily Miller-Almeida, Carlena Hurl, NP   60 mg at 05/26/23 0911   gabapentin (NEURONTIN) capsule 300 mg  300 mg Oral QHS Miller-Almeida, Carlena Hurl, NP   300 mg at 05/25/23 2123   glipiZIDE (GLUCOTROL) tablet 10 mg  10 mg Oral QAC breakfast Verner Chol, MD   10 mg at 05/26/23 0920   hydrOXYzine (ATARAX) tablet 25 mg  25 mg Oral TID PRN Donato Heinz, NP   25 mg at 05/19/23 2136   insulin aspart (novoLOG) injection 0-15 Units  0-15 Units Subcutaneous TID WC Miller-Almeida, Carlena Hurl, NP   3 Units at 05/26/23 6295   insulin aspart (novoLOG) injection 0-5 Units  0-5 Units Subcutaneous QHS Miller-Almeida, Carlena Hurl, NP   2 Units at 05/23/23 2132   magnesium hydroxide (MILK OF MAGNESIA) suspension 30 mL  30 mL Oral Daily PRN Miller-Almeida, Carlena Hurl, NP       metFORMIN (GLUCOPHAGE) tablet 1,000 mg  1,000 mg Oral BID WC Miller-Almeida, Carlena Hurl, NP   1,000 mg at 05/26/23 0935   naproxen (NAPROSYN) tablet 250 mg  250  mg Oral BID PRN Miller-Almeida, Carlena Hurl, NP       nicotine (NICODERM CQ - dosed in mg/24 hours) patch 14 mg  14 mg Transdermal Daily Verner Chol, MD   14 mg at 05/26/23 0941   OLANZapine (ZYPREXA) injection 5 mg  5 mg Intramuscular TID PRN Miller-Almeida, Carlena Hurl, NP       OLANZapine zydis (ZYPREXA) disintegrating tablet 5 mg  5 mg Oral TID PRN Miller-Almeida, Carlena Hurl, NP       traZODone (DESYREL) tablet 50 mg  50 mg Oral QHS Verner Chol, MD   50 mg at 05/25/23 2123   PTA Medications: Medications Prior to Admission  Medication Sig Dispense Refill Last Dose/Taking   Accu-Chek Softclix Lancets lancets Use to check blood sugar 3 times daily. 100 each 6    acetaminophen (TYLENOL) 325 MG tablet Take 650 mg by mouth every 6 (six) hours as needed for mild pain or moderate pain.      amLODipine (NORVASC) 10 MG tablet Take 1 tablet (10 mg total) by mouth daily. 90 tablet 1    atorvastatin (LIPITOR) 40 MG tablet Take 1 tablet (40 mg total) by mouth daily. 30 tablet 4    Blood Glucose Monitoring Suppl (ACCU-CHEK GUIDE) w/Device KIT Use to check blood sugar 3 times daily. 1 kit 0  cholecalciferol (VITAMIN D3) 25 MCG (1000 UNIT) tablet Take 1,000 Units by mouth daily.      cyclobenzaprine (FLEXERIL) 10 MG tablet Take 1 tablet (10 mg total) by mouth 2 (two) times daily as needed for muscle spasms. 20 tablet 0    Dulaglutide (TRULICITY) 1.5 MG/0.5ML SOPN Inject 1.5 mg into the skin once a week.      DULoxetine (CYMBALTA) 60 MG capsule Take 1 capsule (60 mg total) by mouth daily. 90 capsule 1    famotidine (PEPCID) 10 MG tablet Take 1 tablet (10 mg total) by mouth 2 (two) times daily. (Patient not taking: Reported on 12/03/2022) 60 tablet 3    gabapentin (NEURONTIN) 300 MG capsule Take 1 capsule (300 mg total) by mouth at bedtime. 90 capsule 1    glipiZIDE (GLUCOTROL XL) 10 MG 24 hr tablet Take 1 tablet (10 mg total) by mouth daily with breakfast. 30 tablet 3    glucose blood (ACCU-CHEK GUIDE TEST)  test strip Use to check blood sugar 3 times daily. 100 each 6    hyoscyamine (ANASPAZ) 0.125 MG TBDP disintergrating tablet Place 1 tablet (0.125 mg total) under the tongue every 6 (six) hours as needed for up to 10 doses for bladder spasms. 10 tablet 0    insulin aspart (NOVOLOG) 100 UNIT/ML FlexPen Inject 0-9 Units into the skin 3 (three) times daily with meals. 30 mL 1    Insulin Pen Needle (COMFORT EZ PEN NEEDLES) 31G X 6 MM MISC use to inject insulin 3 times daily 100 each 1    lidocaine (LIDODERM) 5 % Place 1 patch onto the skin daily. Remove & Discard patch within 12 hours or as directed by MD 30 patch 0    Menthol, Topical Analgesic, 4 % GEL Apply 1 application  topically every 2 (two) hours as needed (Apply to painful muscle joints).      metFORMIN (GLUCOPHAGE) 1000 MG tablet Take 1 tablet (1,000 mg total) by mouth 2 (two) times daily with a meal. 60 tablet 4    naproxen sodium (ALEVE) 220 MG tablet Take 220 mg by mouth 2 (two) times daily as needed (Pain).      nicotine (NICODERM CQ) 7 mg/24hr patch Place 1 patch (7 mg total) onto the skin daily. 28 patch 3    nicotine polacrilex (NICORETTE) 4 MG gum Take 4 mg by mouth daily as needed for smoking cessation (between the gum and the cheek every 1-2 hours as needed for cigarette craving).      polyethylene glycol (MIRALAX / GLYCOLAX) 17 g packet Take 17 g by mouth 2 (two) times daily as needed for moderate constipation or mild constipation.      tamsulosin (FLOMAX) 0.4 MG CAPS capsule Take 1 capsule (0.4 mg total) by mouth daily. 30 capsule 8    traZODone (DESYREL) 100 MG tablet Take 1 tablet (100 mg total) by mouth at bedtime as needed for sleep. (Patient taking differently: Take 100 mg by mouth at bedtime.) 30 tablet 3     Patient Stressors: Financial difficulties   Substance abuse    Patient Strengths: Communication skills   Treatment Modalities: Medication Management, Group therapy, Case management,  1 to 1 session with clinician,  Psychoeducation, Recreational therapy.   Physician Treatment Plan for Primary Diagnosis: MDD (major depressive disorder), severe (HCC) Long Term Goal(s): Improvement in symptoms so as ready for discharge   Short Term Goals: Ability to identify changes in lifestyle to reduce recurrence of condition will improve Ability to verbalize  feelings will improve Ability to disclose and discuss suicidal ideas Ability to demonstrate self-control will improve Ability to identify and develop effective coping behaviors will improve Ability to maintain clinical measurements within normal limits will improve Compliance with prescribed medications will improve Ability to identify triggers associated with substance abuse/mental health issues will improve  Medication Management: Evaluate patient's response, side effects, and tolerance of medication regimen.  Therapeutic Interventions: 1 to 1 sessions, Unit Group sessions and Medication administration.  Evaluation of Outcomes: Adequate for Discharge  Physician Treatment Plan for Secondary Diagnosis: Principal Problem:   MDD (major depressive disorder), severe (HCC)  Long Term Goal(s): Improvement in symptoms so as ready for discharge   Short Term Goals: Ability to identify changes in lifestyle to reduce recurrence of condition will improve Ability to verbalize feelings will improve Ability to disclose and discuss suicidal ideas Ability to demonstrate self-control will improve Ability to identify and develop effective coping behaviors will improve Ability to maintain clinical measurements within normal limits will improve Compliance with prescribed medications will improve Ability to identify triggers associated with substance abuse/mental health issues will improve     Medication Management: Evaluate patient's response, side effects, and tolerance of medication regimen.  Therapeutic Interventions: 1 to 1 sessions, Unit Group sessions and Medication  administration.  Evaluation of Outcomes: Adequate for Discharge   RN Treatment Plan for Primary Diagnosis: MDD (major depressive disorder), severe (HCC) Long Term Goal(s): Knowledge of disease and therapeutic regimen to maintain health will improve  Short Term Goals: Ability to remain free from injury will improve, Ability to verbalize frustration and anger appropriately will improve, Ability to demonstrate self-control, Ability to participate in decision making will improve, Ability to verbalize feelings will improve, Ability to disclose and discuss suicidal ideas, Ability to identify and develop effective coping behaviors will improve, and Compliance with prescribed medications will improve  Medication Management: RN will administer medications as ordered by provider, will assess and evaluate patient's response and provide education to patient for prescribed medication. RN will report any adverse and/or side effects to prescribing provider.  Therapeutic Interventions: 1 on 1 counseling sessions, Psychoeducation, Medication administration, Evaluate responses to treatment, Monitor vital signs and CBGs as ordered, Perform/monitor CIWA, COWS, AIMS and Fall Risk screenings as ordered, Perform wound care treatments as ordered.  Evaluation of Outcomes: Adequate for Discharge   LCSW Treatment Plan for Primary Diagnosis: MDD (major depressive disorder), severe (HCC) Long Term Goal(s): Safe transition to appropriate next level of care at discharge, Engage patient in therapeutic group addressing interpersonal concerns.  Short Term Goals: Engage patient in aftercare planning with referrals and resources, Increase social support, Increase ability to appropriately verbalize feelings, Increase emotional regulation, Facilitate acceptance of mental health diagnosis and concerns, Facilitate patient progression through stages of change regarding substance use diagnoses and concerns, Identify triggers associated  with mental health/substance abuse issues, and Increase skills for wellness and recovery  Therapeutic Interventions: Assess for all discharge needs, 1 to 1 time with Social worker, Explore available resources and support systems, Assess for adequacy in community support network, Educate family and significant other(s) on suicide prevention, Complete Psychosocial Assessment, Interpersonal group therapy.  Evaluation of Outcomes: Adequate for Discharge   Progress in Treatment: Attending groups: Yes. and No. Participating in groups: Yes. and No. Taking medication as prescribed: Yes. Toleration medication: Yes. Family/Significant other contact made: No, will contact:  CSW will contact if given permission  Patient understands diagnosis: Yes. Discussing patient identified problems/goals with staff: Yes. Medical problems stabilized or  resolved: Yes. Denies suicidal/homicidal ideation: Yes. Issues/concerns per patient self-inventory: No. Other: None    New problem(s) identified: No, Describe:  None identified Update 05/26/23: No changes at this time    New Short Term/Long Term Goal(s): detox, elimination of symptoms of psychosis, medication management for mood stabilization; elimination of SI thoughts; development of comprehensive mental wellness/sobriety plan. Update 05/26/23: No changes at this time    Patient Goals:  "That's all, trying to get me somewhere to stay" Update 05/26/23: No changes at this time    Discharge Plan or Barriers: CSW will assist with appropriate discharge planning Update 05/26/23: No changes at this time    Reason for Continuation of Hospitalization: Depression Medication stabilization   Estimated Length of Stay: 1 to 7 days Update 05/26/23: TBD  Last 3 [place] Suicide Severity Risk Score: Flowsheet Row Admission (Current) from 05/19/2023 in [facility] Most recent reading at 05/19/2023  4:00 PM ED from 05/19/2023 in [facility] Most recent reading at 05/19/2023  3:40 AM ED from 05/18/2023 in [facility] Emergency Department at [facility] Most recent reading at 05/18/2023  7:05 AM  C-SSRS RISK CATEGORY Low Risk Low Risk Low Risk       Last PHQ 2/9 Scores:    09/17/2022    3:23 PM 07/25/2022   11:16 AM 07/03/2022    4:42 PM  Depression screen PHQ 2/9  Decreased Interest 0 0 2  Down, Depressed, Hopeless 0 0 3  PHQ - 2 Score 0 0 5  Altered sleeping 0 2 3  Tired, decreased energy 0 0 3  Change in appetite 0 0 3  Feeling bad or failure about yourself  0 1 3  Trouble concentrating 0 1 3  Moving slowly or fidgety/restless 0 1 3  Suicidal thoughts 0 1 3  PHQ-9 Score 0 6 26  Difficult doing work/chores Not difficult at all  Extremely dIfficult    Scribe for Treatment Team: Elza Rafter, Theresia Majors 05/26/2023 10:47 AM

## 2023-05-26 NOTE — Progress Notes (Signed)
 [facility] MD Progress Note  05/26/2023  David Wyatt  MRN:  [id number]  David Wyatt 63 year old male presents to [facility] from ED stating "am losing my mind." Patient reports losing his apartment 3 weeks ago and currently is disabled and homeless. Patient endorses substance use of crack cocaine with last use being yesterday. Patient endorse poor sleep, bad appetite with weight loss, poor concentration, Patient denies SI, denies HI Denies AH, endorses VH reporting he sees shadows.   Subjective:  Chart reviewed, case discussed in multidisciplinary meeting, patient seen during rounds.  Staff reports that patient is complaining of diarrhea.  Loperamide prescribed as needed.  Patient today endorses" not good" mood.  He feels" sick".  Due to GI symptoms.  Denies SI/HI/intent/plan.  He denies auditory/visual hallucinations.  He is requesting the social work team to help him with rehab.  Patient is awaiting placement   Sleep: Fair  Appetite:  Fair  Past Psychiatric History: see h&P Family History:  Family History  Problem Relation Age of Onset   Diabetes Mother    Kidney disease Mother    Hyperlipidemia Mother    Hyperlipidemia Father    Diabetes Father    Social History:  Social History   Substance and Sexual Activity  Alcohol Use No   Alcohol/week: 0.0 standard drinks of alcohol     Social History   Substance and Sexual Activity  Drug Use Yes   Types: "Crack" cocaine   Comment: when stressed    Social History   Socioeconomic History   Marital status: Divorced    Spouse name: Not on file   Number of children: 3   Years of education: 12   Highest education level: Not on file  Occupational History    Comment: disabled  Tobacco Use   Smoking status: Every Day    Current packs/day: 0.25    Average packs/day: 0.3 packs/day for 30.0 years (7.5 ttl pk-yrs)    Types: Cigarettes   Smokeless tobacco: Former    Quit date: 08/15/2015   Tobacco comments:    04/29/16 2  cigs daily, 10/27/17  sometimes < .25 PPD  Vaping Use   Vaping status: Never Used  Substance and Sexual Activity   Alcohol use: No    Alcohol/week: 0.0 standard drinks of alcohol   Drug use: Yes    Types: "Crack" cocaine    Comment: when stressed   Sexual activity: Not on file  Other Topics Concern   Not on file  Social History Narrative   04/29/17   Lives in shelter   Caffeine- a lot of  tea, coffee   Social Drivers of Corporate investment banker Strain: Not on file  Food Insecurity: Food Insecurity Present (05/19/2023)   Hunger Vital Sign    Worried About Running Out of Food in the Last Year: Often true    Ran Out of Food in the Last Year: Often true  Transportation Needs: Unmet Transportation Needs (05/19/2023)   PRAPARE - Administrator, Civil Service (Medical): Yes    Lack of Transportation (Non-Medical): Yes  Physical Activity: Not on file  Stress: Not on file  Social Connections: Not on file   Past Medical History:  Past Medical History:  Diagnosis Date   Angioedema of lips 07/28/2012   left upper (07/29/2012)   Arthritis    hands and back   Chronic back pain    Cocaine abuse (HCC)    Diabetic neuropathy (HCC)    High cholesterol  Hypertension    Neuropathy    Type II diabetes mellitus (HCC)     Past Surgical History:  Procedure Laterality Date   BACK SURGERY     CYSTOSCOPY W/ URETERAL STENT PLACEMENT Right 02/23/2021   Procedure: CYSTOSCOPY WITH RETROGRADE PYELOGRAM/URETERAL STENT PLACEMENT;  Surgeon: Jannifer Hick, MD;  Location: [facility];  Service: Urology;  Laterality: Right;   CYSTOSCOPY/URETEROSCOPY/HOLMIUM LASER/STENT PLACEMENT Right 12/05/2022   Procedure: CYSTOSCOPY, RIGHT URETEROSCOPY, AND RIGHT URETERAL STENT PLACEMENT;  Surgeon: Adonis Brook, MD;  Location: [facility];  Service: Urology;  Laterality: Right;  90 MINUTES   HERNIA REPAIR Right 04/01/2012   I & D EXTREMITY Left 12/13/2012   Procedure: IRRIGATION AND DEBRIDEMENT LEFT THUMB;  Surgeon: Tami Ribas, MD;  Location: [facility] OR;  Service: Orthopedics;  Laterality: Left;   INCISION AND DRAINAGE OF WOUND     boil on back/notes 07/14/2008 (07/29/2012)   INGUINAL HERNIA REPAIR  04/01/2012   Procedure: HERNIA REPAIR INGUINAL ADULT;  Surgeon: Currie Paris, MD;  Location: [facility] OR;  Service: General;  Laterality: Right;   INGUINAL HERNIA REPAIR Left 09/06/2016   Procedure: OPEN REPAIR LEFT INGUINAL HERNIA;  Surgeon: Gaynelle Adu, MD;  Location: [facility] OR;  Service: General;  Laterality: Left;   INSERTION OF MESH Left 09/06/2016   Procedure: INSERTION OF MESH;  Surgeon: Gaynelle Adu, MD;  Location: [facility] OR;  Service: General;  Laterality: Left;   TONSILLECTOMY      Current Medications: Current Facility-Administered Medications  Medication Dose Route Frequency Provider Last Rate Last Admin   acetaminophen (TYLENOL) tablet 650 mg  650 mg Oral Q6H PRN Miller-Almeida, Carlena Hurl, NP       alum & mag hydroxide-simeth (MAALOX/MYLANTA) 200-200-20 MG/5ML suspension 30 mL  30 mL Oral Q4H PRN Miller-Almeida, Carlena Hurl, NP       amLODipine (NORVASC) tablet 10 mg  10 mg Oral Daily Miller-Almeida, Carlena Hurl, NP   10 mg at 05/26/23 0911   atorvastatin (LIPITOR) tablet 40 mg  40 mg Oral Daily Miller-Almeida, Carlena Hurl, NP   40 mg at 05/26/23 0911   cyclobenzaprine (FLEXERIL) tablet 10 mg  10 mg Oral BID PRN Miller-Almeida, Carlena Hurl, NP       DULoxetine (CYMBALTA) DR capsule 60 mg  60 mg Oral Daily Miller-Almeida, Carlena Hurl, NP   60 mg at 05/26/23 0911   gabapentin (NEURONTIN) capsule 300 mg  300 mg Oral QHS Miller-Almeida, Carlena Hurl, NP   300 mg at 05/25/23 2123   glipiZIDE (GLUCOTROL) tablet 10 mg  10 mg Oral QAC breakfast Verner Chol, MD   10 mg at 05/26/23 0920   hydrOXYzine (ATARAX) tablet 25 mg  25 mg Oral TID PRN Donato Heinz, NP   25 mg at 05/19/23 2136   insulin aspart (novoLOG) injection 0-15 Units  0-15 Units Subcutaneous TID WC Miller-Almeida, Carlena Hurl, NP   3 Units at 05/26/23 1809   insulin aspart  (novoLOG) injection 0-5 Units  0-5 Units Subcutaneous QHS Miller-Almeida, Carlena Hurl, NP   2 Units at 05/23/23 2132   loperamide (IMODIUM) capsule 2 mg  2 mg Oral PRN Lewanda Rife, MD   2 mg at 05/26/23 1143   magnesium hydroxide (MILK OF MAGNESIA) suspension 30 mL  30 mL Oral Daily PRN Miller-Almeida, Carlena Hurl, NP       metFORMIN (GLUCOPHAGE) tablet 1,000 mg  1,000 mg Oral BID WC Miller-Almeida, Carlena Hurl, NP   1,000 mg at 05/26/23 1809   multivitamin with minerals  tablet 1 tablet  1 tablet Oral Daily Verner Chol, MD   1 tablet at 05/26/23 1809   naproxen (NAPROSYN) tablet 250 mg  250 mg Oral BID PRN Miller-Almeida, Carlena Hurl, NP       nicotine (NICODERM CQ - dosed in mg/24 hours) patch 14 mg  14 mg Transdermal Daily Verner Chol, MD   14 mg at 05/26/23 0941   OLANZapine (ZYPREXA) injection 5 mg  5 mg Intramuscular TID PRN Miller-Almeida, Carlena Hurl, NP       OLANZapine zydis (ZYPREXA) disintegrating tablet 5 mg  5 mg Oral TID PRN Miller-Almeida, Carlena Hurl, NP       traZODone (DESYREL) tablet 50 mg  50 mg Oral QHS Verner Chol, MD   50 mg at 05/25/23 2123    Lab Results:  Results for orders placed or performed during the hospital encounter of 05/19/23 (from the past 48 hours)  Glucose, capillary     Status: Abnormal   Collection Time: 05/25/23  7:39 AM  Result Value Ref Range   Glucose-Capillary 255 (H) 70 - 99 mg/dL    Comment: Glucose reference range applies only to samples taken after fasting for at least 8 hours.  Glucose, capillary     Status: Abnormal   Collection Time: 05/25/23 10:40 AM  Result Value Ref Range   Glucose-Capillary 189 (H) 70 - 99 mg/dL    Comment: Glucose reference range applies only to samples taken after fasting for at least 8 hours.  Glucose, capillary     Status: Abnormal   Collection Time: 05/25/23  4:21 PM  Result Value Ref Range   Glucose-Capillary 211 (H) 70 - 99 mg/dL    Comment: Glucose reference range applies only to samples taken after fasting for at  least 8 hours.  Glucose, capillary     Status: Abnormal   Collection Time: 05/25/23  7:40 PM  Result Value Ref Range   Glucose-Capillary 189 (H) 70 - 99 mg/dL    Comment: Glucose reference range applies only to samples taken after fasting for at least 8 hours.  Glucose, capillary     Status: Abnormal   Collection Time: 05/26/23 11:38 AM  Result Value Ref Range   Glucose-Capillary 137 (H) 70 - 99 mg/dL    Comment: Glucose reference range applies only to samples taken after fasting for at least 8 hours.  Glucose, capillary     Status: Abnormal   Collection Time: 05/26/23  4:53 PM  Result Value Ref Range   Glucose-Capillary 195 (H) 70 - 99 mg/dL    Comment: Glucose reference range applies only to samples taken after fasting for at least 8 hours.    Blood Alcohol level:  Lab Results  Component Value Date   ETH <10 05/19/2023   ETH <10 05/18/2023    Metabolic Disorder Labs: Lab Results  Component Value Date   HGBA1C 8.3 (H) 05/23/2023   MPG 191.51 05/23/2023   MPG 309.18 12/02/2022   Lab Results  Component Value Date   PROLACTIN 6.9 06/19/2022   Lab Results  Component Value Date   CHOL 172 05/18/2023   TRIG 192 (H) 05/18/2023   HDL 45 05/18/2023   CHOLHDL 3.8 05/18/2023   VLDL 38 05/18/2023   LDLCALC 89 05/18/2023   LDLCALC 122 (H) 05/23/2021      Psychiatric Specialty Exam:  Presentation  General Appearance:  Appropriate for Environment; Casual  Eye Contact: Fair  Speech: Clear and Coherent  Speech Volume: Normal    Mood and  Affect  Mood: "Not good"  Affect: Constricted   Thought Process  Thought Processes: Coherent  Descriptions of Associations:Intact  Orientation:Full (Time, Place and Person)  Thought Content:Logical  Hallucinations:Hallucinations: None  Ideas of Reference:None  Suicidal Thoughts:Suicidal Thoughts: No  Homicidal Thoughts:Homicidal Thoughts: No   Sensorium  Memory: Immediate Fair; Recent Fair; Remote  Fair  Judgment: Improving  Insight: Fair   Chartered certified accountant: Fair  Attention Span: Fair  Recall: [employer] of Knowledge: Fair  Language: Fair   Psychomotor Activity  Psychomotor Activity: Psychomotor Activity: Normal  Musculoskeletal: Strength & Muscle Tone: within normal limits Gait & Station: normal Assets  Assets: Manufacturing systems engineer; Desire for Improvement; Resilience    Physical Exam: Physical Exam Vitals and nursing note reviewed.  HENT:     Head: Normocephalic.     Nose: Nose normal.     Mouth/Throat:     Mouth: Mucous membranes are moist.  Eyes:     Pupils: Pupils are equal, round, and reactive to light.  Cardiovascular:     Rate and Rhythm: Normal rate.     Pulses: Normal pulses.  Skin:    General: Skin is warm.  Neurological:     General: No focal deficit present.     Mental Status: He is alert.    Review of Systems  Constitutional: Negative.   HENT: Negative.    Eyes: Negative.   Respiratory: Negative.    Cardiovascular: Negative.   Gastrointestinal:  Positive for diarrhea and nausea.  Skin: Negative.   Neurological: Negative.    Blood pressure 113/79, pulse 100, temperature 98 F (36.7 C), resp. rate 18, height 5\' 8"  (1.727 m), weight 65.8 kg, SpO2 98%. Body mass index is 22.05 kg/m.  Diagnosis: Principal Problem:   MDD (major depressive disorder), severe (HCC)   Diagnosis:  Principal Problem:   MDD (major depressive disorder), severe (HCC) Stimulant Use disorder, severe     Treatment Plan Summary:   Safety and Monitoring:             -- Voluntary admission to inpatient psychiatric unit for safety, stabilization and treatment             -- Daily contact with patient to assess and evaluate symptoms and progress in treatment             -- Patient's case to be discussed in multi-disciplinary team meeting             -- Observation Level: q15 minute checks             -- Vital signs:  q12 hours              -- Precautions: suicide, elopement, and assault   2. Psychiatric Diagnoses and Treatment:                Continue Cymbalta and Gabapentin Trazodone 50 mg nightly for insomnia -- The risks/benefits/side-effects/alternatives to this medication were discussed in detail with the patient and time was given for questions. The patient consents to medication trial.                           -- Encouraged patient to participate in unit milieu and in scheduled group therapies                            3. Medical Issues Being Addressed: Diarrhea    No urgent medical  problems 4. Discharge Planning:              -- Social work and case management to assist with discharge planning and identification of hospital follow-up needs prior to discharge             -- Estimated LOS: 3-4 days             -- Discharge Concerns: Need to establish a safety plan; Medication compliance and effectiveness             -- Discharge Goals: Return home with outpatient referrals follow ups   Physician Treatment Plan for Primary Diagnosis: MDD (major depressive disorder), severe (HCC) Long Term Goal(s): Improvement in symptoms so as ready for discharge   Short Term Goals: Ability to identify changes in lifestyle to reduce recurrence of condition will improve, Ability to verbalize feelings will improve, Ability to disclose and discuss suicidal ideas, Ability to demonstrate self-control will improve, and Ability to identify and develop effective coping behaviors will improve      Lewanda Rife, MD

## 2023-05-26 NOTE — Group Note (Signed)
 Recreation Therapy Group Note   Group Topic:Health and Wellness  Group Date: 05/26/2023 Start Time: 1520 End Time: 1620 Facilitators: Rosina Lowenstein, LRT, CTRS Location: Courtyard  Group Description: Outdoor Recreation. Patients had the option to play corn hole, ring toss, bowling or listening to music while outside in the courtyard getting fresh air and sunlight. LRT and patients discussed things that they enjoy doing in their free time outside of the hospital. LRT encouraged patients to drink water after being active and getting their heart rate up.   Goal Area(s) Addressed: Patient will identify leisure interests.  Patient will practice healthy decision making. Patient will engage in recreation activity.   Affect/Mood: Appropriate   Participation Level: Minimal    Clinical Observations/Individualized Feedback: David Wyatt was present in group for less than 10 minutes.   Plan: Continue to engage patient in RT group sessions 2-3x/week.   Rosina Lowenstein, LRT, CTRS 05/26/2023 5:06 PM

## 2023-05-26 NOTE — Inpatient Diabetes Management (Signed)
 Inpatient Diabetes Program Recommendations  AACE/ADA: New Consensus Statement on Inpatient Glycemic Control (2015)  Target Ranges:  Prepandial:   less than 140 mg/dL      Peak postprandial:   less than 180 mg/dL (1-2 hours)      Critically ill patients:  140 - 180 mg/dL   Lab Results  Component Value Date   GLUCAP 189 (H) 05/25/2023   HGBA1C 8.3 (H) 05/23/2023    Review of Glycemic Control  Latest Reference Range & Units 05/24/23 17:05 05/25/23 07:39 05/25/23 10:40 05/25/23 16:21 05/25/23 19:40  Glucose-Capillary 70 - 99 mg/dL 161 (H) 096 (H) 045 (H) 211 (H) 189 (H)  (H): Data is abnormally high Diabetes history: Type 2 DM Outpatient Diabetes medications: Metformin 1000 mg BID, Glipizide 10 mg QD Current orders for Inpatient glycemic control: Novolog 0-15 units TID & HS, Metformin 1000 mg BID, Glipizide 10 mg QD  Inpatient Diabetes Program Recommendations:    Consider adding semglee/Lantus 12 units every day.  Thanks, Lujean Rave, MSN, RNC-OB Diabetes Coordinator [phone] (8a-5p)

## 2023-05-26 NOTE — Plan of Care (Signed)
  Problem: Education: Goal: Utilization of techniques to improve thought processes will improve Outcome: Progressing Goal: Knowledge of the prescribed therapeutic regimen will improve Outcome: Progressing   Problem: Education: Goal: Ability to state activities that reduce stress will improve Outcome: Progressing

## 2023-05-26 NOTE — Group Note (Signed)
 Recreation Therapy Group Note   Group Topic:Stress Management  Group Date: 05/26/2023 Start Time: 1100 End Time: 1140 Facilitators: Rosina Lowenstein, LRT, CTRS Location:  Dayroom  Group Description: PMR (Progressive Muscle Relaxation). LRT educates patients on what PMR is and the benefits that come from it. Patients are asked to sit with their feet flat on the floor while sitting up and all the way back in their chair, if possible. LRT and pts follow a prompt through a speaker that requires you to tense and release different muscles in their body and focus on their breathing. During session, lights are off and soft music is being played. Pts are given a stress ball to use if needed.  LRT and patients discuss how this technique can be used as a coping skill post-discharge.   Goal Area(s) Addressed:  Patients will be able to describe progressive muscle relaxation.  Patient will practice using relaxation technique. Patient will identify a new coping skill.  Patient will follow multistep directions to reduce anxiety and stress   Affect/Mood: N/A   Participation Level: Did not attend    Clinical Observations/Individualized Feedback: Patient did not attend group.   Plan: Continue to engage patient in RT group sessions 2-3x/week.   Rosina Lowenstein, LRT, CTRS 05/26/2023 1:52 PM

## 2023-05-27 DIAGNOSIS — F322 Major depressive disorder, single episode, severe without psychotic features: Secondary | ICD-10-CM | POA: Diagnosis not present

## 2023-05-27 LAB — GLUCOSE, CAPILLARY
Glucose-Capillary: 155 mg/dL — ABNORMAL HIGH (ref 70–99)
Glucose-Capillary: 109 mg/dL — ABNORMAL HIGH (ref 70–99)
Glucose-Capillary: 147 mg/dL — ABNORMAL HIGH (ref 70–99)
Glucose-Capillary: 133 mg/dL — ABNORMAL HIGH (ref 70–99)

## 2023-05-27 NOTE — Plan of Care (Signed)
  Problem: Education: Goal: Utilization of techniques to improve thought processes will improve Outcome: Progressing Goal: Knowledge of the prescribed therapeutic regimen will improve Outcome: Progressing   Problem: Education: Goal: Ability to state activities that reduce stress will improve Outcome: Progressing   Problem: Coping: Goal: Ability to identify and develop effective coping behavior will improve Outcome: Progressing   Problem: Education: Goal: Knowledge of [place] [facility] Education information/materials will improve Outcome: Progressing Goal: Emotional status will improve Outcome: Progressing Goal: Mental status will improve Outcome: Progressing Goal: Verbalization of understanding the information provided will improve Outcome: Progressing

## 2023-05-27 NOTE — Progress Notes (Signed)
   05/27/23 0714  Psych Admission Type (Psych Patients Only)  Admission Status Voluntary  Psychosocial Assessment  Patient Complaints Malaise;Depression  Eye Contact Fair  Facial Expression Anxious;Sad;Sullen  Affect Sad  Speech Logical/coherent  Interaction Needy;Childlike  Motor Activity Slow  Appearance/Hygiene In scrubs  Behavior Characteristics Cooperative  Mood Sullen  Thought Process  Coherency WDL  Content WDL  Delusions None reported or observed  Perception WDL  Hallucination None reported or observed  Judgment Impaired  Confusion Mild  Danger to Self  Current suicidal ideation? Denies  Danger to Others  Danger to Others None reported or observed

## 2023-05-27 NOTE — Group Note (Signed)
 Recreation Therapy Group Note   Group Topic:Other  Group Date: 05/27/2023 Start Time: 1500 End Time: 1605 Facilitators: Rosina Lowenstein, LRT, CTRS Location: Courtyard  Group Description: Music Reminisce. LRT encouraged patients to think of their favorite song(s) that reminded them of a positive memory or time in their life. LRT encouraged patient to talk about that memory aloud to the group. LRT played the song through a speaker for all to hear. LRT and patients discussed how thinking of a positive memory or time in their life can be used as a coping skill in everyday life post discharge.   Goal Area(s) Addressed: Patient will increase verbal communication by conversing with peers. Patient will contribute to group discussion with minimal prompting. Patient will reminisce a positive memory or moment in their life.    Affect/Mood: Appropriate   Participation Level: Non-verbal    Clinical Observations/Individualized Feedback: David Wyatt came to group late and left early. Pt did not interact with LRT or peers while present in group.   Plan: Continue to engage patient in RT group sessions 2-3x/week.   Rosina Lowenstein, LRT, CTRS 05/27/2023 4:58 PM

## 2023-05-27 NOTE — Progress Notes (Signed)
   05/27/23 0500  Psych Admission Type (Psych Patients Only)  Admission Status Voluntary  Psychosocial Assessment  Patient Complaints Restlessness  Eye Contact Fair  Facial Expression Animated  Affect Appropriate to circumstance  Speech Logical/coherent  Interaction Assertive  Motor Activity Restless  Appearance/Hygiene In scrubs  Behavior Characteristics Cooperative  Mood Pleasant  Thought Process  Coherency WDL  Content WDL  Delusions None reported or observed  Perception WDL  Hallucination None reported or observed  Judgment WDL  Confusion None  Danger to Self  Current suicidal ideation? Denies  Danger to Others  Danger to Others None reported or observed

## 2023-05-27 NOTE — Progress Notes (Signed)
 David Plainfield MD Progress Note  05/27/2023  David Wyatt  MRN:  [id number]  David Wyatt 63 year old male presents to [facility] from ED stating "am losing my mind." Patient reports losing his apartment 3 weeks ago and currently is disabled and homeless. Patient endorses substance use of crack cocaine with last use being yesterday. Patient endorse poor sleep, bad appetite with weight loss, poor concentration, Patient denies SI, denies HI Denies AH, endorses VH reporting he sees shadows.   Subjective:  Chart reviewed, case discussed in multidisciplinary meeting, patient seen during rounds.  Patient continues to report diarrhea.  Loperamide has been prescribed as needed.  Patient today endorses" anxious" mood.  He reports " I feel anxious about my discharge tomorrow". He was provided with reassurance and support.  Denies SI/HI/intent/plan.  He denies auditory/visual hallucinations.   Sleep: Fair  Appetite:  Fair  Past Psychiatric History: see h&P Family History:  Family History  Problem Relation Age of Onset   Diabetes Mother    Kidney disease Mother    Hyperlipidemia Mother    Hyperlipidemia Father    Diabetes Father    Social History:  Social History   Substance and Sexual Activity  Alcohol Use No   Alcohol/week: 0.0 standard drinks of alcohol     Social History   Substance and Sexual Activity  Drug Use Yes   Types: "Crack" cocaine   Comment: when stressed    Social History   Socioeconomic History   Marital status: Divorced    Spouse name: Not on file   Number of children: 3   Years of education: 12   Highest education level: Not on file  Occupational History    Comment: disabled  Tobacco Use   Smoking status: Every Day    Current packs/day: 0.25    Average packs/day: 0.3 packs/day for 30.0 years (7.5 ttl pk-yrs)    Types: Cigarettes   Smokeless tobacco: Former    Quit date: 08/15/2015   Tobacco comments:    04/29/16 2  cigs daily, 10/27/17 sometimes < .25 PPD  Vaping Use   Vaping  status: Never Used  Substance and Sexual Activity   Alcohol use: No    Alcohol/week: 0.0 standard drinks of alcohol   Drug use: Yes    Types: "Crack" cocaine    Comment: when stressed   Sexual activity: Not on file  Other Topics Concern   Not on file  Social History Narrative   04/29/17   Lives in shelter   Caffeine- a lot of  tea, coffee   Social Drivers of Corporate investment banker Strain: Not on file  Food Insecurity: Food Insecurity Present (05/19/2023)   Hunger Vital Sign    Worried About Programme researcher, broadcasting/film/video in the Last Year: Often true    Ran Out of Food in the Last Year: Often true  Transportation Needs: Unmet Transportation Needs (05/19/2023)   PRAPARE - Administrator, Civil Service (Medical): Yes    Lack of Transportation (Non-Medical): Yes  Physical Activity: Not on file  Stress: Not on file  Social Connections: Not on file   Past Medical History:  Past Medical History:  Diagnosis Date   Angioedema of lips 07/28/2012   left upper (07/29/2012)   Arthritis    hands and back   Chronic back pain    Cocaine abuse (HCC)    Diabetic neuropathy (HCC)    High cholesterol    Hypertension    Neuropathy    Type II diabetes  mellitus (HCC)     Past Surgical History:  Procedure Laterality Date   BACK SURGERY     CYSTOSCOPY W/ URETERAL STENT PLACEMENT Right 02/23/2021   Procedure: CYSTOSCOPY WITH RETROGRADE PYELOGRAM/URETERAL STENT PLACEMENT;  Surgeon: Jannifer Hick, MD;  Location: [facility];  Service: Urology;  Laterality: Right;   CYSTOSCOPY/URETEROSCOPY/HOLMIUM LASER/STENT PLACEMENT Right 12/05/2022   Procedure: CYSTOSCOPY, RIGHT URETEROSCOPY, AND RIGHT URETERAL STENT PLACEMENT;  Surgeon: Adonis Brook, MD;  Location: [facility];  Service: Urology;  Laterality: Right;  90 MINUTES   HERNIA REPAIR Right 04/01/2012   I & D EXTREMITY Left 12/13/2012   Procedure: IRRIGATION AND DEBRIDEMENT LEFT THUMB;  Surgeon: Tami Ribas, MD;  Location: [facility] OR;  Service:  Orthopedics;  Laterality: Left;   INCISION AND DRAINAGE OF WOUND     boil on back/notes 07/14/2008 (07/29/2012)   INGUINAL HERNIA REPAIR  04/01/2012   Procedure: HERNIA REPAIR INGUINAL ADULT;  Surgeon: Currie Paris, MD;  Location: [facility] OR;  Service: General;  Laterality: Right;   INGUINAL HERNIA REPAIR Left 09/06/2016   Procedure: OPEN REPAIR LEFT INGUINAL HERNIA;  Surgeon: Gaynelle Adu, MD;  Location: [facility] OR;  Service: General;  Laterality: Left;   INSERTION OF MESH Left 09/06/2016   Procedure: INSERTION OF MESH;  Surgeon: Gaynelle Adu, MD;  Location: [facility] OR;  Service: General;  Laterality: Left;   TONSILLECTOMY      Current Medications: Current Facility-Administered Medications  Medication Dose Route Frequency Provider Last Rate Last Admin   acetaminophen (TYLENOL) tablet 650 mg  650 mg Oral Q6H PRN Miller-Almeida, Carlena Hurl, NP   650 mg at 05/26/23 2036   alum & mag hydroxide-simeth (MAALOX/MYLANTA) 200-200-20 MG/5ML suspension 30 mL  30 mL Oral Q4H PRN Miller-Almeida, Carlena Hurl, NP       amLODipine (NORVASC) tablet 10 mg  10 mg Oral Daily Miller-Almeida, Carlena Hurl, NP   10 mg at 05/27/23 0926   atorvastatin (LIPITOR) tablet 40 mg  40 mg Oral Daily Miller-Almeida, Carlena Hurl, NP   40 mg at 05/27/23 6440   cyclobenzaprine (FLEXERIL) tablet 10 mg  10 mg Oral BID PRN Miller-Almeida, Carlena Hurl, NP       DULoxetine (CYMBALTA) DR capsule 60 mg  60 mg Oral Daily Miller-Almeida, Carlena Hurl, NP   60 mg at 05/27/23 0926   gabapentin (NEURONTIN) capsule 300 mg  300 mg Oral QHS Miller-Almeida, Carlena Hurl, NP   300 mg at 05/26/23 2101   glipiZIDE (GLUCOTROL) tablet 10 mg  10 mg Oral QAC breakfast Verner Chol, MD   10 mg at 05/27/23 3474   hydrOXYzine (ATARAX) tablet 25 mg  25 mg Oral TID PRN Donato Heinz, NP   25 mg at 05/19/23 2136   insulin aspart (novoLOG) injection 0-15 Units  0-15 Units Subcutaneous TID WC Miller-Almeida, Carlena Hurl, NP   3 Units at 05/27/23 1759   insulin aspart (novoLOG) injection 0-5  Units  0-5 Units Subcutaneous QHS Miller-Almeida, Carlena Hurl, NP   2 Units at 05/23/23 2132   loperamide (IMODIUM) capsule 2 mg  2 mg Oral PRN Lewanda Rife, MD   2 mg at 05/27/23 0644   magnesium hydroxide (MILK OF MAGNESIA) suspension 30 mL  30 mL Oral Daily PRN Miller-Almeida, Carlena Hurl, NP       metFORMIN (GLUCOPHAGE) tablet 1,000 mg  1,000 mg Oral BID WC Miller-Almeida, Carlena Hurl, NP   1,000 mg at 05/27/23 1803   multivitamin with minerals tablet 1 tablet  1 tablet Oral Daily  Verner Chol, MD   1 tablet at 05/27/23 1610   naproxen (NAPROSYN) tablet 250 mg  250 mg Oral BID PRN Miller-Almeida, Carlena Hurl, NP       nicotine (NICODERM CQ - dosed in mg/24 hours) patch 14 mg  14 mg Transdermal Daily Verner Chol, MD   14 mg at 05/27/23 0929   OLANZapine (ZYPREXA) injection 5 mg  5 mg Intramuscular TID PRN Miller-Almeida, Carlena Hurl, NP       OLANZapine zydis (ZYPREXA) disintegrating tablet 5 mg  5 mg Oral TID PRN Miller-Almeida, Carlena Hurl, NP       traZODone (DESYREL) tablet 50 mg  50 mg Oral QHS Verner Chol, MD   50 mg at 05/26/23 2101    Lab Results:  Results for orders placed or performed during the hospital encounter of 05/19/23 (from the past 48 hours)  Glucose, capillary     Status: Abnormal   Collection Time: 05/26/23  7:39 AM  Result Value Ref Range   Glucose-Capillary 174 (H) 70 - 99 mg/dL    Comment: Glucose reference range applies only to samples taken after fasting for at least 8 hours.  Glucose, capillary     Status: Abnormal   Collection Time: 05/26/23 11:38 AM  Result Value Ref Range   Glucose-Capillary 137 (H) 70 - 99 mg/dL    Comment: Glucose reference range applies only to samples taken after fasting for at least 8 hours.  Glucose, capillary     Status: Abnormal   Collection Time: 05/26/23  4:53 PM  Result Value Ref Range   Glucose-Capillary 195 (H) 70 - 99 mg/dL    Comment: Glucose reference range applies only to samples taken after fasting for at least 8 hours.   Glucose, capillary     Status: Abnormal   Collection Time: 05/26/23  7:42 PM  Result Value Ref Range   Glucose-Capillary 149 (H) 70 - 99 mg/dL    Comment: Glucose reference range applies only to samples taken after fasting for at least 8 hours.  Glucose, capillary     Status: Abnormal   Collection Time: 05/26/23  7:47 PM  Result Value Ref Range   Glucose-Capillary 155 (H) 70 - 99 mg/dL    Comment: Glucose reference range applies only to samples taken after fasting for at least 8 hours.  Glucose, capillary     Status: Abnormal   Collection Time: 05/27/23  7:40 AM  Result Value Ref Range   Glucose-Capillary 155 (H) 70 - 99 mg/dL    Comment: Glucose reference range applies only to samples taken after fasting for at least 8 hours.  Glucose, capillary     Status: Abnormal   Collection Time: 05/27/23 12:00 PM  Result Value Ref Range   Glucose-Capillary 109 (H) 70 - 99 mg/dL    Comment: Glucose reference range applies only to samples taken after fasting for at least 8 hours.  Glucose, capillary     Status: Abnormal   Collection Time: 05/27/23  5:02 PM  Result Value Ref Range   Glucose-Capillary 147 (H) 70 - 99 mg/dL    Comment: Glucose reference range applies only to samples taken after fasting for at least 8 hours.    Blood Alcohol level:  Lab Results  Component Value Date   [facility] <10 05/19/2023   ETH <10 05/18/2023    Metabolic Disorder Labs: Lab Results  Component Value Date   HGBA1C 8.3 (H) 05/23/2023   MPG 191.51 05/23/2023   MPG 309.18 12/02/2022  Lab Results  Component Value Date   PROLACTIN 6.9 06/19/2022   Lab Results  Component Value Date   CHOL 172 05/18/2023   TRIG 192 (H) 05/18/2023   HDL 45 05/18/2023   CHOLHDL 3.8 05/18/2023   VLDL 38 05/18/2023   LDLCALC 89 05/18/2023   LDLCALC 122 (H) 05/23/2021      Psychiatric Specialty Exam:   Presentation  General Appearance:  Appropriate for Environment; Casual   Eye Contact: Fair   Speech: Clear and  Coherent   Speech Volume: Normal       Mood and Affect  Mood: "Anxious"   Affect: Constricted     Thought Process  Thought Processes: Coherent   Descriptions of Associations:Intact   Orientation:Full (Time, Place and Person)   Thought Content:Logical   Hallucinations:Hallucinations: None   Ideas of Reference:None   Suicidal Thoughts:Suicidal Thoughts: No   Homicidal Thoughts:Homicidal Thoughts: No     Sensorium  Memory: Immediate Fair; Recent Fair; Remote Fair   Judgment: Improving   Insight: Fair     Chartered certified accountant: Fair   Attention Span: Fair   Recall: [employer] of Knowledge: Fair   Language: Fair     Psychomotor Activity  Psychomotor Activity: Psychomotor Activity: Normal   Musculoskeletal: Strength & Muscle Tone: within normal limits Gait & Station: normal Assets  Assets: Manufacturing systems engineer; Desire for Improvement; Resilience       Physical Exam: Physical Exam Vitals and nursing note reviewed.  HENT:     Head: Normocephalic.     Nose: Nose normal.     Mouth/Throat:     Mouth: Mucous membranes are moist.  Eyes:     Pupils: Pupils are equal, round, and reactive to light.  Cardiovascular:     Rate and Rhythm: Normal rate.     Pulses: Normal pulses.  Skin:    General: Skin is warm.  Neurological:     General: No focal deficit present.     Mental Status: He is alert.      Review of Systems  Constitutional: Negative.   HENT: Negative.    Eyes: Negative.   Respiratory: Negative.    Cardiovascular: Negative.   Gastrointestinal:  Positive for diarrhea and nausea.  Skin: Negative.   Neurological: Negative.    Blood pressure 134/83, pulse (!) 105, temperature (!) 97.5 F (36.4 C), resp. rate 14, height 5\' 8"  (1.727 m), weight 65.8 kg, SpO2 100%. Body mass index is 22.05 kg/m.  Diagnosis: Principal Problem:   MDD (major depressive disorder), severe (HCC)   Diagnosis:  Principal Problem:    MDD (major depressive disorder), severe (HCC) Stimulant Use disorder, severe     Treatment Plan Summary:   Safety and Monitoring:             -- Voluntary admission to inpatient psychiatric unit for safety, stabilization and treatment             -- Daily contact with patient to assess and evaluate symptoms and progress in treatment             -- Patient's case to be discussed in multi-disciplinary team meeting             -- Observation Level: q15 minute checks             -- Vital signs:  q12 hours             -- Precautions: suicide, elopement, and assault   2. Psychiatric Diagnoses and Treatment:  Continue Cymbalta and Gabapentin Trazodone 50 mg nightly for insomnia -- The risks/benefits/side-effects/alternatives to this medication were discussed in detail with the patient and time was given for questions. The patient consents to medication trial.                           -- Encouraged patient to participate in unit milieu and in scheduled group therapies                            3. Medical Issues Being Addressed: Diarrhea    No urgent medical problems 4. Discharge Planning:              -- Social work and case management to assist with discharge planning and identification of hospital follow-up needs prior to discharge               Physician Treatment Plan for Primary Diagnosis: MDD (major depressive disorder), severe (HCC) Long Term Goal(s): Improvement in symptoms so as ready for discharge   Short Term Goals: Ability to identify changes in lifestyle to reduce recurrence of condition will improve, Ability to verbalize feelings will improve, Ability to disclose and discuss suicidal ideas, Ability to demonstrate self-control will improve, and Ability to identify and develop effective coping behaviors will improve      Lewanda Rife, MD

## 2023-05-27 NOTE — Group Note (Signed)
 Date:  05/27/2023 Time:  9:35 PM  Group Topic/Focus:  Wrap-Up Group:   The focus of this group is to help patients review their daily goal of treatment and discuss progress on daily workbooks.    Participation Level:  Did Not Attend  Participation Quality:      Affect:      Cognitive:      Insight: None  Engagement in Group:  None  Modes of Intervention:      Additional Comments:    Maeola Harman 05/27/2023, 9:35 PM

## 2023-05-27 NOTE — Group Note (Signed)
 Date:  05/27/2023 Time:  10:57 AM  Group Topic/Focus:  Orientation:   The focus of this group is to educate the patient on the purpose and policies of crisis stabilization and provide a format to answer questions about their admission.  The group details unit policies and expectations of patients while admitted.    Participation Level:  Active  Participation Quality:  Attentive  Affect:  Appropriate  Cognitive:  Appropriate  Insight: Appropriate  Engagement in Group:  Engaged  Modes of Intervention:  Socialization  Additional Comments:     Alexis Frock 05/27/2023, 10:57 AM

## 2023-05-27 NOTE — [facility] Counselor (Signed)
 CSW spoke with pt in the AM regarding paper that pt was given about [facility] in [place], [place].    CSW informed pt that there are no beds at [facility] at this time and that the pt would need to make alternative arrangements.   Pt verbalized understanding.   Reynaldo Minium, MSW, [place] 05/27/2023 1:31 PM

## 2023-05-27 NOTE — Group Note (Signed)
 Recreation Therapy Group Note   Group Topic:Coping Skills  Group Date: 05/27/2023 Start Time: 1100 End Time: 1140 Facilitators: Rosina Lowenstein, LRT, CTRS Location:  Dayroom  Group Description: Seated Exercise. LRT discussed the mental and physical benefits of exercise. LRT and group discussed how physical activity can be used as a coping skill. Pt's and LRT followed along to an exercise video on the TV screen that provided a visual representation and audio description of every exercise performed. Pt's encouraged to listen to their bodies and stop at any time if they experience feelings of discomfort or pain. Pts were encouraged to drink water and stay hydrated.   Goal Area(s) Addressed: Patient will learn benefits of physical activity. Patient will identify exercise as a coping skill.  Patient will follow multistep directions. Patient will try a new leisure interest.    Affect/Mood: N/A   Participation Level: Did not attend    Clinical Observations/Individualized Feedback: Patient did not attend group.   Plan: Continue to engage patient in RT group sessions 2-3x/week.   Rosina Lowenstein, LRT, CTRS 05/27/2023 1:15 PM

## 2023-05-28 ENCOUNTER — Other Ambulatory Visit: Payer: Self-pay

## 2023-05-28 DIAGNOSIS — F322 Major depressive disorder, single episode, severe without psychotic features: Secondary | ICD-10-CM | POA: Diagnosis not present

## 2023-05-28 LAB — GLUCOSE, CAPILLARY
Glucose-Capillary: 151 mg/dL — ABNORMAL HIGH (ref 70–99)
Glucose-Capillary: 123 mg/dL — ABNORMAL HIGH (ref 70–99)

## 2023-05-28 MED ORDER — NICOTINE 14 MG/24HR TD PT24
14.0000 mg | MEDICATED_PATCH | Freq: Every day | TRANSDERMAL | 0 refills | Status: DC
  Filled 2023-05-28: qty 28, 28d supply, fill #0

## 2023-05-28 MED ORDER — ATORVASTATIN CALCIUM 40 MG PO TABS
40.0000 mg | ORAL_TABLET | Freq: Every day | ORAL | 0 refills | Status: DC
Start: 2023-05-28 — End: 2023-07-03
  Filled 2023-05-28: qty 30, 30d supply, fill #0

## 2023-05-28 MED ORDER — CYCLOBENZAPRINE HCL 10 MG PO TABS
10.0000 mg | ORAL_TABLET | Freq: Two times a day (BID) | ORAL | 0 refills | Status: DC | PRN
  Filled 2023-05-28: qty 20, 10d supply, fill #0

## 2023-05-28 MED ORDER — GLIPIZIDE 10 MG PO TABS
10.0000 mg | ORAL_TABLET | Freq: Every day | ORAL | 0 refills | Status: DC
  Filled 2023-05-28: qty 30, 30d supply, fill #0

## 2023-05-28 MED ORDER — ADULT MULTIVITAMIN W/MINERALS CH: 1.0000 | ORAL_TABLET | Freq: Every day | ORAL | 0 refills | Status: DC

## 2023-05-28 MED ORDER — METFORMIN HCL 1000 MG PO TABS
1000.0000 mg | ORAL_TABLET | Freq: Two times a day (BID) | ORAL | 0 refills | Status: DC
Start: 2023-05-28 — End: 2023-07-01
  Filled 2023-05-28: qty 60, 30d supply, fill #0

## 2023-05-28 MED ORDER — TRAZODONE HCL 50 MG PO TABS
50.0000 mg | ORAL_TABLET | Freq: Every day | ORAL | 0 refills | Status: DC
  Filled 2023-05-28: qty 30, 30d supply, fill #0

## 2023-05-28 MED ORDER — NAPROXEN 250 MG PO TABS
250.0000 mg | ORAL_TABLET | Freq: Two times a day (BID) | ORAL | 0 refills | Status: DC | PRN
  Filled 2023-05-28: qty 15, 8d supply, fill #0

## 2023-05-28 MED ORDER — GABAPENTIN 300 MG PO CAPS
300.0000 mg | ORAL_CAPSULE | Freq: Every day | ORAL | 0 refills | Status: DC
  Filled 2023-05-28: qty 90, 90d supply, fill #0

## 2023-05-28 MED ORDER — LOPERAMIDE HCL 2 MG PO CAPS
2.0000 mg | ORAL_CAPSULE | ORAL | 0 refills | Status: DC | PRN
  Filled 2023-05-28: qty 30, 30d supply, fill #0

## 2023-05-28 MED ORDER — AMLODIPINE BESYLATE 10 MG PO TABS
10.0000 mg | ORAL_TABLET | Freq: Every day | ORAL | 0 refills | Status: DC
Start: 2023-05-28 — End: 2023-07-03
  Filled 2023-05-28: qty 30, 30d supply, fill #0

## 2023-05-28 MED ORDER — DULOXETINE HCL 60 MG PO CPEP
60.0000 mg | ORAL_CAPSULE | Freq: Every day | ORAL | 0 refills | Status: DC
  Filled 2023-05-28: qty 30, 30d supply, fill #0

## 2023-05-28 MED ORDER — HYDROXYZINE HCL 25 MG PO TABS
25.0000 mg | ORAL_TABLET | Freq: Three times a day (TID) | ORAL | 0 refills | Status: DC | PRN
  Filled 2023-05-28: qty 30, 10d supply, fill #0

## 2023-05-28 NOTE — Plan of Care (Signed)
   05/27/23 2056  Psych Admission Type (Psych Patients Only)  Admission Status Voluntary  Psychosocial Assessment  Patient Complaints Depression  Eye Contact Glaring  Facial Expression Flat;Sad  Affect Sad  Speech Logical/coherent  Interaction Childlike  Motor Activity Slow  Appearance/Hygiene In scrubs  Behavior Characteristics Cooperative  Mood Sad  Thought Process  Coherency WDL  Content WDL  Delusions None reported or observed  Perception WDL  Hallucination None reported or observed  Judgment WDL  Confusion WDL  Danger to Self  Current suicidal ideation? Denies  Danger to Others  Danger to Others None reported or observed     Problem: Education: Goal: Utilization of techniques to improve thought processes will improve Outcome: Progressing Goal: Knowledge of the prescribed therapeutic regimen will improve Outcome: Progressing   Problem: Education: Goal: Ability to state activities that reduce stress will improve Outcome: Progressing   Problem: Coping: Goal: Ability to identify and develop effective coping behavior will improve Outcome: Progressing

## 2023-05-28 NOTE — Progress Notes (Signed)
  [facility] Adult Case Management Discharge Plan :  Will you be returning to the same living situation after discharge:  Yes,  pt will go to [facility]  At discharge, do you have transportation home?: Yes,  CSW will assist with transportation  Do you have the ability to pay for your medications: Yes,  [employer]ED [employer]AN / [employer] TAIL[employer]ED [employer]AN  Release of information consent forms completed and in the chart;  Patient's signature needed at discharge.  Patient to Follow up at:  Follow-up Information     [facility] Follow up on 06/04/2023.   Why: Your appointment is scheduled for 06/04/23 at 12pm. You will receive a call at [phone] Contact information: [address]  [address] [place] [address] [phone]                 Next level of care provider has access to [facility] Link:no  Safety Planning and Suicide Prevention discussed: Yes,  pt declined, CSW went over [facility] brochure with pt      Has patient been referred to the Quitline?: Patient refused referral for treatment  Patient has been referred for addiction treatment: Patient refused referral for treatment; referral information given to patient at discharge.  [address], LCSWA 05/28/2023, 9:58 AM

## 2023-05-28 NOTE — Group Note (Signed)
 Date:  05/28/2023 Time:  10:45 AM  Group Topic/Focus:  Goals Group:   The focus of this group is to help patients establish daily goals to achieve during treatment and discuss how the patient can incorporate goal setting into their daily lives to aide in recovery.    Participation Level:  Did Not Attend  David Wyatt 05/28/2023, 10:45 AM

## 2023-05-28 NOTE — Discharge Summary (Signed)
 Physician Discharge Summary Note  Patient:  David Wyatt is an 63 y.o., male MRN:  [id number] DOB:  August 28, 1960 Patient phone:  [phone] (home)  Patient address:   [address] [place] [address],    Date of Admission:  05/19/2023 Date of Discharge: 05/28/2023  Reason for Admission:  Rashi 63 year old male presents to [facility] from ED stating "am losing my mind." Patient reports losing his apartment 3 weeks ago and currently is disabled and homeless. Patient endorses substance use of crack cocaine with last use being yesterday. Patient endorse poor sleep, bad appetite with weight loss, poor concentration, Patient denies SI, denies HI Denies AH, endorses VH reporting he sees shadows.   Principal Problem: MDD (major depressive disorder), severe (HCC) Discharge Diagnoses: Principal Problem:   MDD (major depressive disorder), severe (HCC)   Past Psychiatric History: Prev Dx/Sx: cocaine use, depression Current Psych Provider: none Home Meds (current): Duloxetine, gabapentin Previous Med Trials: unknown Therapy: none   Prior Psych Hospitalization: one in 2024  Prior Self Harm: denies Prior Violence: denies  Past Medical History:  Past Medical History:  Diagnosis Date   Angioedema of lips 07/28/2012   left upper (07/29/2012)   Arthritis    hands and back   Chronic back pain    Cocaine abuse (HCC)    Diabetic neuropathy (HCC)    High cholesterol    Hypertension    Neuropathy    Type II diabetes mellitus (HCC)     Past Surgical History:  Procedure Laterality Date   BACK SURGERY     CYSTOSCOPY W/ URETERAL STENT PLACEMENT Right 02/23/2021   Procedure: CYSTOSCOPY WITH RETROGRADE PYELOGRAM/URETERAL STENT PLACEMENT;  Surgeon: Jannifer Hick, MD;  Location: [facility];  Service: Urology;  Laterality: Right;   CYSTOSCOPY/URETEROSCOPY/HOLMIUM LASER/STENT PLACEMENT Right 12/05/2022   Procedure: CYSTOSCOPY, RIGHT URETEROSCOPY, AND RIGHT URETERAL STENT PLACEMENT;  Surgeon:  Adonis Brook, MD;  Location: [facility];  Service: Urology;  Laterality: Right;  90 MINUTES   HERNIA REPAIR Right 04/01/2012   I & D EXTREMITY Left 12/13/2012   Procedure: IRRIGATION AND DEBRIDEMENT LEFT THUMB;  Surgeon: Tami Ribas, MD;  Location: [facility] OR;  Service: Orthopedics;  Laterality: Left;   INCISION AND DRAINAGE OF WOUND     boil on back/notes 07/14/2008 (07/29/2012)   INGUINAL HERNIA REPAIR  04/01/2012   Procedure: HERNIA REPAIR INGUINAL ADULT;  Surgeon: Currie Paris, MD;  Location: [facility] OR;  Service: General;  Laterality: Right;   INGUINAL HERNIA REPAIR Left 09/06/2016   Procedure: OPEN REPAIR LEFT INGUINAL HERNIA;  Surgeon: Gaynelle Adu, MD;  Location[facility] OR;  Service: General;  Laterality: Left;   INSERTION OF MESH Left 09/06/2016   Procedure: INSERTION OF MESH;  Surgeon: Gaynelle Adu, MD;  Location: [facility] OR;  Service: General;  Laterality: Left;   TONSILLECTOMY     Family History:  Family History  Problem Relation Age of Onset   Diabetes Mother    Kidney disease Mother    Hyperlipidemia Mother    Hyperlipidemia Father    Diabetes Father    Family Psychiatric  History: : denies Family Hx suicide: denies  Social History:  Social History   Substance and Sexual Activity  Alcohol Use No   Alcohol/week: 0.0 standard drinks of alcohol     Social History   Substance and Sexual Activity  Drug Use Yes   Types: "Crack" cocaine   Comment: when stressed    Social History   Socioeconomic History   Marital status: Divorced  Spouse name: Not on file   Number of children: 3   Years of education: 34   Highest education level: Not on file  Occupational History    Comment: disabled  Tobacco Use   Smoking status: Every Day    Current packs/day: 0.25    Average packs/day: 0.3 packs/day for 30.0 years (7.5 ttl pk-yrs)    Types: Cigarettes   Smokeless tobacco: Former    Quit date: 08/15/2015   Tobacco comments:    04/29/16 2  cigs daily, 10/27/17 sometimes < .25 PPD   Vaping Use   Vaping status: Never Used  Substance and Sexual Activity   Alcohol use: No    Alcohol/week: 0.0 standard drinks of alcohol   Drug use: Yes    Types: "Crack" cocaine    Comment: when stressed   Sexual activity: Not on file  Other Topics Concern   Not on file  Social History Narrative   04/29/17   Lives in shelter   Caffeine- a lot of  tea, coffee   Social Drivers of Corporate investment banker Strain: Not on file  Food Insecurity: Food Insecurity Present (05/19/2023)   Hunger Vital Sign    Worried About Running Out of Food in the Last Year: Often true    Ran Out of Food in the Last Year: Often true  Transportation Needs: Unmet Transportation Needs (05/19/2023)   PRAPARE - Administrator, Civil Service (Medical): Yes    Lack of Transportation (Non-Medical): Yes  Physical Activity: Not on file  Stress: Not on file  Social Connections: Not on file    Hospital Course:  The patient was admitted to Inpatient psychiatric treatment for stabilization of psychosis and paranoid behaviors. Patient was placed on safety precautions. The patient was evaluated and treated by the multidisciplinary treatment team including physicians, nurses, social workers and therapists. All medications were presented to the patient and the Patient gave consent to all the medications that they were given, as well as was explained the risks, benefits, side effects and alternatives of all medication therapies. The patient was integrated into the general milieu on the ward and encouraged to attend to his ADLs and participate in all groups and activities. During hospital course the Patient attended coping skill groups, music therapy and activity therapy groups. Patient was counseled on cognitive techniques/skills by multiple staff members and given support care by the staff.   Patient's medication regimen was evaluated and titrated to therapeutic levels to better Patient's overall daily  functioning.  Patient was initially seen by Dr. Irwin Brakeman.patient was started on duloxetine for depression.  Patient was also started on home medications including glipizide and metformin.  Patient was placed on sliding scale of insulin.  At the time of discharge patient did not want insulin.  He was encouraged to take glipizide and metformin as prescribed.  He tolerated the medication well with no significant side effects. During the hospitalization, the patient demonstrated a stabilization of mood with decreased racing thoughts, decreased impulsivity, improved sleep and decreased irritability. At the time of discharge, the patient denied any suicidal ideation/homicidal ideation and was not overtly depressed, manic or psychotic. The Patient was interacting well in groups and on the unit with their peers. Patient was able to identify a safety plan to include speaking with family, contacting outpatient provider or calling 911 if hallucinations/delusions returned or worsened or thoughts of self-harm or suicide return. Patient was counselled on outpatient follow-up that was arranged prior to discharge.  Patient was anxious at the time of discharge because of his housing situation.  Patient reported that he gets daughter 57 and disability.  Patient was encouraged to use somebody to find a place.  Patient was referred to IR to see by Child psychotherapist.  Patient was encouraged not to use cocaine or any other illicit drugs.  Psychiatric Specialty Exam:   Presentation  General Appearance:  Appropriate for Environment; Casual   Eye Contact: Fair   Speech: Clear and Coherent   Speech Volume: Normal       Mood and Affect  Mood: "Anxious"   Affect: Constricted     Thought Process  Thought Processes: Coherent   Descriptions of Associations:Intact   Orientation:Full (Time, Place and Person)   Thought Content:Logical   Hallucinations:Hallucinations: None   Ideas of Reference:None   Suicidal  Thoughts:Suicidal Thoughts: No   Homicidal Thoughts:Homicidal Thoughts: No     Sensorium  Memory: Immediate Fair; Recent Fair; Remote Fair   Judgment: Improving   Insight: Fair     Chartered certified accountant: Fair   Attention Span: Fair   Recall: [employer] of Knowledge: Fair   Language: Fair     Psychomotor Activity  Psychomotor Activity: Psychomotor Activity: Normal   Musculoskeletal: Strength & Muscle Tone: within normal limits Gait & Station: normal Assets  Assets: Manufacturing systems engineer; Desire for Improvement; Resilience       Physical Exam: Physical Exam Vitals and nursing note reviewed.  HENT:     Head: Normocephalic.     Nose: Nose normal.     Mouth/Throat:     Mouth: Mucous membranes are moist.  Eyes:     Pupils: Pupils are equal, round, and reactive to light.  Cardiovascular:     Rate and Rhythm: Normal rate.     Pulses: Normal pulses.  Skin:    General: Skin is warm.  Neurological:     General: No focal deficit present.     Mental Status: He is alert.      Review of Systems  Constitutional: Negative.   HENT: Negative.    Eyes: Negative.   Respiratory: Negative.    Cardiovascular: Negative.   Gastrointestinal: Denies N/V/D  Skin: Negative.   Neurological: Negative.    Blood pressure 119/75, pulse 99, temperature 98.1 F (36.7 C), resp. rate 18, height 5\' 8"  (1.727 m), weight 65.8 kg, SpO2 96%. Body mass index is 22.05 kg/m.   Social History   Tobacco Use  Smoking Status Every Day   Current packs/day: 0.25   Average packs/day: 0.3 packs/day for 30.0 years (7.5 ttl pk-yrs)   Types: Cigarettes  Smokeless Tobacco Former   Quit date: 08/15/2015  Tobacco Comments   04/29/16 2  cigs daily, 10/27/17 sometimes < .25 PPD   Tobacco Cessation:  A prescription for an FDA-approved tobacco cessation medication provided at discharge   Blood Alcohol level:  Lab Results  Component Value Date   ETH <10 05/19/2023   ETH <10  05/18/2023    Metabolic Disorder Labs:  Lab Results  Component Value Date   HGBA1C 8.3 (H) 05/23/2023   MPG 191.51 05/23/2023   MPG 309.18 12/02/2022   Lab Results  Component Value Date   PROLACTIN 6.9 06/19/2022   Lab Results  Component Value Date   CHOL 172 05/18/2023   TRIG 192 (H) 05/18/2023   HDL 45 05/18/2023   CHOLHDL 3.8 05/18/2023   VLDL 38 05/18/2023   LDLCALC 89 05/18/2023   LDLCALC 122 (H) 05/23/2021  See Psychiatric Specialty Exam and Suicide Risk Assessment completed by Attending Physician prior to discharge.  Discharge destination:  Other:  IRC   Is patient on multiple antipsychotic therapies at discharge:  No     Recommended Plan for Multiple Antipsychotic Therapies: NA   Allergies as of 05/28/2023       Reactions   Lisinopril Other (See Comments), Swelling   Lip Swelling, angioedema angioedema  Lip Swelling  Lip Swelling, angioedema        Medication List     STOP taking these medications    Accu-Chek Guide Test test strip Generic drug: glucose blood   Accu-Chek Guide w/Device Kit   Accu-Chek Softclix Lancets lancets   acetaminophen 325 MG tablet Commonly known as: TYLENOL   cholecalciferol 25 MCG (1000 UNIT) tablet Commonly known as: VITAMIN D3   Comfort EZ Pen Needles 31G X 6 MM Misc Generic drug: Insulin Pen Needle   famotidine 10 MG tablet Commonly known as: PEPCID   glipiZIDE 10 MG 24 hr tablet Commonly known as: GLUCOTROL XL Replaced by: glipiZIDE 10 MG tablet   hyoscyamine 0.125 MG Tbdp disintergrating tablet Commonly known as: ANASPAZ   lidocaine 5 % Commonly known as: Lidoderm   Menthol (Topical Analgesic) 4 % Gel   naproxen sodium 220 MG tablet Commonly known as: ALEVE Replaced by: naproxen 250 MG tablet   nicotine 7 mg/24hr patch Commonly known as: Nicoderm CQ Replaced by: nicotine 14 mg/24hr patch   nicotine polacrilex 4 MG gum Commonly known as: NICORETTE   NovoLOG FlexPen 100 UNIT/ML  FlexPen Generic drug: insulin aspart   polyethylene glycol 17 g packet Commonly known as: MIRALAX / GLYCOLAX   tamsulosin 0.4 MG Caps capsule Commonly known as: FLOMAX   Trulicity 1.5 MG/0.5ML Soaj Generic drug: Dulaglutide       TAKE these medications      Indication  amLODipine 10 MG tablet Commonly known as: NORVASC Take 1 tablet (10 mg total) by mouth daily.  Indication: High Blood Pressure   atorvastatin 40 MG tablet Commonly known as: LIPITOR Take 1 tablet (40 mg total) by mouth daily.  Indication: High Amount of Fats in the Blood   cyclobenzaprine 10 MG tablet Commonly known as: FLEXERIL Take 1 tablet (10 mg total) by mouth 2 (two) times daily as needed for muscle spasms.    DULoxetine 60 MG capsule Commonly known as: Cymbalta Take 1 capsule (60 mg total) by mouth daily.  Indication: Musculoskeletal Pain, Peripheral Nerve Disease   gabapentin 300 MG capsule Commonly known as: NEURONTIN Take 1 capsule (300 mg total) by mouth at bedtime.    glipiZIDE 10 MG tablet Commonly known as: GLUCOTROL Take 1 tablet (10 mg total) by mouth daily before breakfast. Start taking on: May 29, 2023 Replaces: glipiZIDE 10 MG 24 hr tablet    hydrOXYzine 25 MG tablet Commonly known as: ATARAX Take 1 tablet (25 mg total) by mouth 3 (three) times daily as needed for anxiety.    loperamide 2 MG capsule Commonly known as: IMODIUM Take 1 capsule (2 mg total) by mouth as needed for diarrhea or loose stools.    metFORMIN 1000 MG tablet Commonly known as: GLUCOPHAGE Take 1 tablet (1,000 mg total) by mouth 2 (two) times daily with a meal.  Indication: Type 2 Diabetes   multivitamin with minerals Tabs tablet Take 1 tablet by mouth daily.    naproxen 250 MG tablet Commonly known as: NAPROSYN Take 1 tablet (250 mg total) by mouth 2 (two) times  daily as needed (Pain). Replaces: naproxen sodium 220 MG tablet    nicotine 14 mg/24hr patch Commonly known as: NICODERM CQ - dosed  in mg/24 hours Place 1 patch (14 mg total) onto the skin daily. Replaces: nicotine 7 mg/24hr patch    traZODone 50 MG tablet Commonly known as: DESYREL Take 1 tablet (50 mg total) by mouth at bedtime. What changed:  medication strength how much to take when to take this reasons to take this         Follow-up Information     [facility] Follow up on 06/04/2023.   Why: Your appointment is scheduled for 06/04/23 at 12pm. You will receive a call at [phone] Contact information: [address]  [address] [place] [address] [phone]                 PATIENTS CONDITION AT DISCHARGE: Stable  TOBACCO CESSATION SCREENING  Patient was screened and counselled on smoking cessation at time of discharge.    PRESCRIPTION ARE LOCATED: On Chart  DISCHARGE INSTRUCTIONS: Diet: Diabetic and cardiac healthy diet Activity: As tolerated  Take medications as prescribed and not to make any changes without first consulting with the outpatient provider. Patient was advised to avoid any illicit drugs or alcohol due to negative impact on physical and mental health.  Patient should keep all follow up appointments.  TIME SPENT ON DISCHARGE: Over 35 minutes were spent on this patient's discharge including a face-to-face encounter, patient counseling, and preparation of discharge materials.   Signed: Lewanda Rife, MD 05/28/2023, 12:31 PM

## 2023-05-28 NOTE — Congregational Nurse Program (Signed)
 Client to RN office after being discharged from hospital this morning. RN able to refer his to [facility] services to help him with getting housing to help client. Client has medicine that needs to be filled and [facility] interns took paper prescriptions to [facility] on [address] to assist with filling. RN will pick up prescriptions tomorrow and deliver to client at [facility]

## 2023-05-28 NOTE — Progress Notes (Signed)
   05/28/23 1104  Psych Admission Type (Psych Patients Only)  Admission Status Voluntary  Psychosocial Assessment  Patient Complaints Depression  Eye Contact Fair  Facial Expression Anxious;Sad;Sullen  Affect Sad  Speech Logical/coherent  Interaction Needy;Childlike  Motor Activity Slow  Appearance/Hygiene In scrubs  Behavior Characteristics Cooperative  Mood Anxious  Thought Process  Coherency WDL  Content WDL  Delusions None reported or observed  Perception WDL  Hallucination None reported or observed  Judgment Impaired  Confusion Mild  Danger to Self  Current suicidal ideation? Denies  Danger to Others  Danger to Others None reported or observed

## 2023-05-28 NOTE — [facility] Suicide Risk Assessment (Signed)
 [facility] INPATIENT:  Family/Significant Other Suicide Prevention Education  Suicide Prevention Education:  Patient Refusal for Family/Significant Other Suicide Prevention Education: The patient David Wyatt has refused to provide written consent for family/significant other to be provided Family/Significant Other Suicide Prevention Education during admission and/or prior to discharge.  Physician notified.  Simonne Boulos D Dalyn Kjos 05/28/2023, 10:00 AM

## 2023-05-28 NOTE — [facility] Suicide Risk Assessment (Signed)
 [facility] Discharge Suicide Risk Assessment   Principal Problem: MDD (major depressive disorder), severe (HCC) Discharge Diagnoses: Principal Problem:   MDD (major depressive disorder), severe [facility])     Psychiatric Specialty Exam:   Presentation  General Appearance:  Appropriate for Environment; Casual   Eye Contact: Fair   Speech: Clear and Coherent   Speech Volume: Normal       Mood and Affect  Mood: "Anxious"   Affect: Constricted     Thought Process  Thought Processes: Coherent   Descriptions of Associations:Intact   Orientation:Full (Time, Place and Person)   Thought Content:Logical   Hallucinations:Hallucinations: None   Ideas of Reference:None   Suicidal Thoughts:Suicidal Thoughts: No   Homicidal Thoughts:Homicidal Thoughts: No     Sensorium  Memory: Immediate Fair; Recent Fair; Remote Fair   Judgment: Improving   Insight: Fair     Chartered certified accountant: Fair   Attention Span: Fair   Recall: [employer] of Knowledge: Fair   Language: Fair     Psychomotor Activity  Psychomotor Activity: Psychomotor Activity: Normal   Musculoskeletal: Strength & Muscle Tone: within normal limits Gait & Station: normal Assets  Assets: Manufacturing systems engineer; Desire for Improvement; Resilience       Physical Exam: Physical Exam Vitals and nursing note reviewed.  HENT:     Head: Normocephalic.     Nose: Nose normal.     Mouth/Throat:     Mouth: Mucous membranes are moist.  Eyes:     Pupils: Pupils are equal, round, and reactive to light.  Cardiovascular:     Rate and Rhythm: Normal rate.     Pulses: Normal pulses.  Skin:    General: Skin is warm.  Neurological:     General: No focal deficit present.     Mental Status: He is alert.      Review of Systems  Constitutional: Negative.   HENT: Negative.    Eyes: Negative.   Respiratory: Negative.    Cardiovascular: Negative.   Gastrointestinal: Denies N/V/D   Skin: Negative.   Neurological: Negative.    Blood pressure 119/75, pulse 99, temperature 98.1 F (36.7 C), resp. rate 18, height 5\' 8"  (1.727 m), weight 65.8 kg, SpO2 96%. Body mass index is 22.05 kg/m.   Demographic Factors:  Male, Low socioeconomic status, and Unemployed  Loss Factors: Decrease in vocational status, Decline in physical health, and Financial problems/change in socioeconomic status  Historical Factors: Impulsivity  Risk Reduction Factors:   Religious beliefs about death and Positive coping skills or problem solving skills  Continued Clinical Symptoms:  Alcohol/Substance Abuse/Dependencies Previous Psychiatric Diagnoses and Treatments  Cognitive Features That Contribute To Risk:  Thought constriction (tunnel vision)    Suicide Risk:  Minimal: No identifiable suicidal ideation.     Follow-up Information     [facility] Follow up on 06/04/2023.   Why: Your appointment is scheduled for 06/04/23 at 12pm. You will receive a call at [phone] Contact information: [address]  [address] [place] [address] [phone]                 Plan Of Care/Follow-up recommendations:  Per discharge Summary  Lewanda Rife, MD 05/28/2023, 10:09 AM

## 2023-05-29 ENCOUNTER — Other Ambulatory Visit (HOSPITAL_COMMUNITY): Payer: Self-pay

## 2023-05-29 ENCOUNTER — Other Ambulatory Visit: Payer: Self-pay

## 2023-05-29 ENCOUNTER — Telehealth: Payer: Self-pay

## 2023-05-29 NOTE — Transitions of Care (Post Inpatient/ED Visit) (Signed)
 05/29/2023  Name: David Wyatt MRN: [id number] DOB: [birth year]  Today's TOC FU Call Status: Today's TOC FU Call Status:: Successful TOC FU Call Completed TOC FU Call Complete Date: 05/29/23 Patient's Name and Date of Birth confirmed.  Transition Care Management Follow-up Telephone Call Date of Discharge: 05/28/23 Discharge Facility: [facility]) Type of Discharge: Inpatient Admission Primary Inpatient Discharge Diagnosis:: Major Depressive Disorder, severe How have you been since you were released from the hospital?: Better Any questions or concerns?: No  Items Reviewed: Did you receive and understand the discharge instructions provided?: Yes Medications obtained,verified, and reconciled?: Yes (Medications Reviewed) Any new allergies since your discharge?: No Dietary orders reviewed?: Yes Type of Diet Ordered:: Low sodium, heart healthy, patient stated he is going to pick up his food stamps today Do you have support at home?: No (does have "safety plan", currently staying in "a car" not his car because he was unable to find a shelter bed, is meeting with "someone today about housing")  Medications Reviewed Today: Medications Reviewed Today     Reviewed by Marcos Eke, RN (Registered Nurse) on 05/29/23 at 1047  Med List Status: <None>   Medication Order Taking? Sig Documenting Provider Last Dose Status Informant  amLODipine (NORVASC) 10 MG tablet [id number] Yes Take 1 tablet (10 mg total) by mouth daily. Lewanda Rife, MD Taking Active   atorvastatin (LIPITOR) 40 MG tablet [id number] Yes Take 1 tablet (40 mg total) by mouth daily. Lewanda Rife, MD Taking Active   cyclobenzaprine (FLEXERIL) 10 MG tablet [id number] Yes Take 1 tablet (10 mg total) by mouth 2 (two) times daily as needed for muscle spasms. Lewanda Rife, MD Taking Active   DULoxetine (CYMBALTA) 60 MG capsule [id number] Yes Take 1 capsule (60 mg total) by mouth daily. Lewanda Rife, MD Taking Active   gabapentin (NEURONTIN) 300 MG capsule [id number] Yes Take 1 capsule (300 mg total) by mouth at bedtime. Lewanda Rife, MD Taking Active   glipiZIDE (GLUCOTROL) 10 MG tablet [id number] Yes Take 1 tablet (10 mg total) by mouth daily before breakfast. Lewanda Rife, MD Taking Active   hydrOXYzine (ATARAX) 25 MG tablet [id number] Yes Take 1 tablet (25 mg total) by mouth 3 (three) times daily as needed for anxiety. Lewanda Rife, MD Taking Active   loperamide (IMODIUM) 2 MG capsule [id number] Yes Take 1 capsule (2 mg total) by mouth as needed for diarrhea or loose stools. Lewanda Rife, MD Taking Active   metFORMIN (GLUCOPHAGE) 1000 MG tablet [id number] Yes Take 1 tablet (1,000 mg total) by mouth 2 (two) times daily with a meal. Lewanda Rife, MD Taking Active   Multiple Vitamin (MULTIVITAMIN WITH MINERALS) TABS tablet [id number] Yes Take 1 tablet by mouth daily. Lewanda Rife, MD Taking Active   naproxen (NAPROSYN) 250 MG tablet [id number] Yes Take 1 tablet (250 mg total) by mouth 2 (two) times daily as needed (Pain). Lewanda Rife, MD Taking Active   nicotine (NICODERM CQ - DOSED IN MG/24 HOURS) 14 mg/24hr patch [id number] Yes Place 1 patch (14 mg total) onto the skin daily. Lewanda Rife, MD Taking Active   traZODone (DESYREL) 50 MG tablet [id number] Yes Take 1 tablet (50 mg total) by mouth at bedtime. Lewanda Rife, MD Taking Active             Home Care and Equipment/Supplies: Were Home Health Services Ordered?: NA Any new equipment or medical supplies ordered?: NA  Functional Questionnaire: Do you need assistance with bathing/showering or dressing?: No Do you need  assistance with meal preparation?: No Do you need assistance with eating?: No Do you have difficulty maintaining continence: No Do you need assistance with getting out of bed/getting out of a chair/moving?: No Do you have difficulty managing or taking your medications?:  No  Follow up appointments reviewed: PCP Follow-up appointment confirmed?: No (encouraged patient to follow up with his PCP but patient explained has more pressing priorities right now - will follow up with patient again tomorrow and will bring up scheduling PCP hospital follow up at that time) MD Provider Line [phone] Given: No Specialist Hospital Follow-up appointment confirmed?: NA Do you need transportation to your follow-up appointment?: Yes Transportation Need Intervention Addressed By:: AMB Referral For SDOH Needs Do you understand care options if your condition(s) worsen?: Yes-patient verbalized understanding RNCM agreed to follow up with him tomorrow, 05/30/23 to check on food and housing status after he meets with [facility] caseworker today, 05/28/23   Elnita Maxwell A. Mliss Fritz RN, BA, [facility], CRRN [facility] RN Care Manager, Transition of Care [phone]

## 2023-05-30 ENCOUNTER — Telehealth: Payer: Self-pay

## 2023-05-30 ENCOUNTER — Other Ambulatory Visit: Payer: Self-pay

## 2023-05-30 NOTE — Transitions of Care (Post Inpatient/ED Visit) (Signed)
   05/30/2023  Name: Alem Fahl MRN: [id number] DOB: [birth year]  Today's TOC FU Call Status: TOC FU Call Complete Date: 05/30/23 Patient's Name and Date of Birth confirmed.  Transition Care Management Follow-up Telephone Call Date of Discharge: 05/28/23 Discharge Facility: [facility]) Type of Discharge: Inpatient Admission Primary Inpatient Discharge Diagnosis:: MDD, severe How have you been since you were released from the hospital?: Worse (Today patient voiced that he "can't think straight' but is on his way to speak to someone about housing, denied suicidal ideation.Referred pt back to his agreed upn "safety plan"prior to discharge & provided him with additional number to call ([phone])) Any questions or concerns?: Yes Patient Questions/Concerns:: get housing - committed to calling patient back this afternoon, as well as calling his [facility] Tailored Plan CM [facility] for urgent patient follow up and calling [facility] who referred pt to [facility]  Items Reviewed: 1.Reviewed patient's agreed-upon safety plan developed prior to dc from hospital. 2.Provided patient with 60 Crisis phone line and urged him to call this number anytime if he felt he needed to talk to someone about his thoughts and feelings, especially if he had thoughts of suicide which he currently denies having. 3.Provided patient with PCP, Dr. Odette Horns [place]'s office phone number [phone] and urged him to call for hospital follow up appointment. 4.RNCM committed to calling [facility] T[facility]ored Plan Case Management (immediately following speaking to patient) regarding patient's recent admission/discharge & ongoing care management needs to [facility]'s post-hospitalization follow up program [done 3/21@10 :40am]; Unable to speak to patient's assigned [facility] Case Manager but this RNCM left lengthy message on patient's assigned Case Manager's confidential line requesting urgent follow up with  patient and also request for call-back to this RNCM [completed 3/21@10 :52am]. Patient's assigned [facility] CM is [facility] (unsure of spelling of last name). 5. RN called [facility] and spoke to Esther Hardy [3/21 11:01-11:16am] who committed to sending information of patient's post-hospitalization issues to one of their RNs for urgent follow up. 6.Called patient back as promised at end of day [3/21@4 ;45pm] to check on patient's status regarding reaching the destination he was going to this morning to look into temporary housing (suspect it was [facility] [employer], [address], [place], which is address given to me by [facility] as source of support for patient. Unable to talk to patient but was able to leave [facility]'s physical address on patient's voicemail as potential source of support.  Francis Doenges A. Mliss Fritz RN, BA, [facility], CRRN [facility] RN Care Manager, Transition of Care [phone]

## 2023-06-02 ENCOUNTER — Other Ambulatory Visit (HOSPITAL_BASED_OUTPATIENT_CLINIC_OR_DEPARTMENT_OTHER): Payer: Self-pay

## 2023-06-02 ENCOUNTER — Other Ambulatory Visit: Payer: Self-pay

## 2023-06-02 ENCOUNTER — Other Ambulatory Visit (HOSPITAL_COMMUNITY): Payer: Self-pay

## 2023-06-02 ENCOUNTER — Other Ambulatory Visit: Payer: Self-pay | Admitting: Physician Assistant

## 2023-06-02 DIAGNOSIS — E1165 Type 2 diabetes mellitus with hyperglycemia: Secondary | ICD-10-CM

## 2023-06-05 ENCOUNTER — Other Ambulatory Visit: Payer: Self-pay

## 2023-06-08 ENCOUNTER — Emergency Department (HOSPITAL_COMMUNITY)
Admission: EM | Admit: 2023-06-08 | Discharge: 2023-06-08 | Disposition: A | Discharge status: Home / Self Care | Payer: MEDICAID | Attending: Emergency Medicine | Admitting: Emergency Medicine

## 2023-06-08 ENCOUNTER — Other Ambulatory Visit: Payer: Self-pay

## 2023-06-08 DIAGNOSIS — Z7984 Long term (current) use of oral hypoglycemic drugs: Secondary | ICD-10-CM | POA: Diagnosis not present

## 2023-06-08 DIAGNOSIS — Z79899 Other long term (current) drug therapy: Secondary | ICD-10-CM | POA: Insufficient documentation

## 2023-06-08 DIAGNOSIS — R739 Hyperglycemia, unspecified: Secondary | ICD-10-CM | POA: Diagnosis present

## 2023-06-08 LAB — CBG MONITORING, ED
Glucose-Capillary: 223 mg/dL — ABNORMAL HIGH (ref 70–99)
Glucose-Capillary: 206 mg/dL — ABNORMAL HIGH (ref 70–99)

## 2023-06-08 LAB — COMPREHENSIVE METABOLIC PANEL WITH GFR
ALT: 19 U/L (ref 0–44)
AST: 20 U/L (ref 15–41)
Albumin: 3.3 g/dL — ABNORMAL LOW (ref 3.5–5.0)
Alkaline Phosphatase: 85 U/L (ref 38–126)
Anion gap: 13 (ref 5–15)
BUN: 20 mg/dL (ref 8–23)
CO2: 23 mmol/L (ref 22–32)
Calcium: 9.3 mg/dL (ref 8.9–10.3)
Chloride: 104 mmol/L (ref 98–111)
Creatinine, Ser: 1.34 mg/dL — ABNORMAL HIGH (ref 0.61–1.24)
GFR, Estimated: 60 mL/min — ABNORMAL LOW (ref >60)
Glucose, Bld: 223 mg/dL — ABNORMAL HIGH (ref 70–99)
Potassium: 4.5 mmol/L (ref 3.5–5.1)
Sodium: 140 mmol/L (ref 135–145)
Total Bilirubin: 0.6 mg/dL (ref 0.0–1.2)
Total Protein: 7.2 g/dL (ref 6.5–8.1)

## 2023-06-08 LAB — CBC WITH DIFFERENTIAL/PLATELET
Abs Immature Granulocytes: 0.04 10*3/uL (ref 0.00–0.07)
Basophils Absolute: 0.1 10*3/uL (ref 0.0–0.1)
Basophils Relative: 1 %
Eosinophils Absolute: 0.2 10*3/uL (ref 0.0–0.5)
Eosinophils Relative: 2 %
HCT: 40.5 % (ref 39.0–52.0)
Hemoglobin: 13.2 g/dL (ref 13.0–17.0)
Immature Granulocytes: 0 %
Lymphocytes Relative: 24 %
Lymphs Abs: 2.3 10*3/uL (ref 0.7–4.0)
MCH: 31.4 pg (ref 26.0–34.0)
MCHC: 32.6 g/dL (ref 30.0–36.0)
MCV: 96.2 fL (ref 80.0–100.0)
Monocytes Absolute: 0.6 10*3/uL (ref 0.1–1.0)
Monocytes Relative: 6 %
Neutro Abs: 6.4 10*3/uL (ref 1.7–7.7)
Neutrophils Relative %: 67 %
Platelets: 403 10*3/uL — ABNORMAL HIGH (ref 150–400)
RBC: 4.21 MIL/uL — ABNORMAL LOW (ref 4.22–5.81)
RDW: 13.7 % (ref 11.5–15.5)
WBC: 9.6 10*3/uL (ref 4.0–10.5)
nRBC: 0 % (ref 0.0–0.2)

## 2023-06-08 MED ORDER — GLIPIZIDE ER 10 MG PO TB24
10.0000 mg | ORAL_TABLET | Freq: Every day | ORAL | Status: DC
  Administered 2023-06-08: 10 mg via ORAL
  Filled 2023-06-08: qty 1

## 2023-06-08 MED ORDER — METFORMIN HCL 500 MG PO TABS
1000.0000 mg | ORAL_TABLET | Freq: Once | ORAL | Status: AC
  Administered 2023-06-08: 1000 mg via ORAL
  Filled 2023-06-08: qty 2

## 2023-06-08 NOTE — ED Triage Notes (Signed)
 BIB EMS for Hyperglycemia, CBG 261 per EMS. Pt ran out of insulin approximately one weeks ago. Hx of HTN, and depression.

## 2023-06-09 ENCOUNTER — Other Ambulatory Visit: Payer: Self-pay

## 2023-06-09 LAB — GLUCOSE, POCT (MANUAL RESULT ENTRY): POC Glucose: 332 mg/dL — AB (ref 70–99)

## 2023-06-09 NOTE — [facility] (Signed)
 Client to RN office. He is in need of housing assistance. RN called rooming housing for client to assist. He is going to go visit viable housing office today. Additionally, he is just released from ED and RN checked CBG and asked if had gotten his medicine and said no. He also, just ate some candy and a meal and CBG is 337. He states he is going to pharmacy now to pick up medicine. He also states he has been very stressed about being outside and that is making his blood sugar more elevated. RN will reach out to him tomorrow to see if he needs additional support.

## 2023-06-09 NOTE — ED Provider Notes (Signed)
 [place] EMERGENCY DEPARTMENT AT [facility] Provider Note   CSN: [id number] Arrival date & time: 06/08/23  0441     History  Chief Complaint  Patient presents with   Hyperglycemia    David Wyatt is a 63 y.o. male.  63 yo M here with cbg's in the 200's. States he is supposed to be on metform, glipizide and trulicity but hasn't had them in awhile and can't afford them until 4/1. Increased thirst and urination. No infections. No pains.    Hyperglycemia      Home Medications Prior to Admission medications   Medication Sig Start Date End Date Taking? Authorizing Provider  amLODipine (NORVASC) 10 MG tablet Take 1 tablet (10 mg total) by mouth daily. 05/28/23   Lewanda Rife, MD  atorvastatin (LIPITOR) 40 MG tablet Take 1 tablet (40 mg total) by mouth daily. 05/28/23   Lewanda Rife, MD  cyclobenzaprine (FLEXERIL) 10 MG tablet Take 1 tablet (10 mg total) by mouth 2 (two) times daily as needed for muscle spasms. 05/28/23   Lewanda Rife, MD  DULoxetine (CYMBALTA) 60 MG capsule Take 1 capsule (60 mg total) by mouth daily. 05/28/23   Lewanda Rife, MD  gabapentin (NEURONTIN) 300 MG capsule Take 1 capsule (300 mg total) by mouth at bedtime. 05/28/23   Lewanda Rife, MD  glipiZIDE (GLUCOTROL) 10 MG tablet Take 1 tablet (10 mg total) by mouth daily before breakfast. 05/29/23   Lewanda Rife, MD  hydrOXYzine (ATARAX) 25 MG tablet Take 1 tablet (25 mg total) by mouth 3 (three) times daily as needed for anxiety. 05/28/23   Lewanda Rife, MD  loperamide (IMODIUM) 2 MG capsule Take 1 capsule (2 mg total) by mouth as needed for diarrhea or loose stools. 05/28/23   Lewanda Rife, MD  metFORMIN (GLUCOPHAGE) 1000 MG tablet Take 1 tablet (1,000 mg total) by mouth 2 (two) times daily with a meal. 05/28/23   Lewanda Rife, MD  Multiple Vitamin (MULTIVITAMIN WITH MINERALS) TABS tablet Take 1 tablet by mouth daily. 05/28/23   Lewanda Rife, MD  naproxen  (NAPROSYN) 250 MG tablet Take 1 tablet (250 mg total) by mouth 2 (two) times daily as needed (Pain). 05/28/23   Lewanda Rife, MD  nicotine (NICODERM CQ - DOSED IN MG/24 HOURS) 14 mg/24hr patch Place 1 patch (14 mg total) onto the skin daily. 05/28/23   Lewanda Rife, MD  traZODone (DESYREL) 50 MG tablet Take 1 tablet (50 mg total) by mouth at bedtime. 05/28/23   Lewanda Rife, MD      Allergies    Lisinopril    Review of Systems   Review of Systems  Physical Exam Updated Vital Signs BP (!) 146/79 (BP Location: Right Arm)   Pulse 74   Temp 98.4 F (36.9 C) (Oral)   Resp 16   SpO2 98%  Physical Exam Vitals and nursing note reviewed.  Constitutional:      Appearance: He is well-developed.  HENT:     Head: Normocephalic and atraumatic.     Nose: No congestion or rhinorrhea.  Eyes:     Pupils: Pupils are equal, round, and reactive to light.  Cardiovascular:     Rate and Rhythm: Normal rate.  Pulmonary:     Effort: Pulmonary effort is normal. No respiratory distress.  Abdominal:     General: There is no distension.  Musculoskeletal:        General: Normal range of motion.     Cervical back: Normal range of motion.  Skin:  General: Skin is warm and dry.  Neurological:     General: No focal deficit present.     Mental Status: He is alert.     ED Results / Procedures / Treatments   Labs (all labs ordered are listed, but only abnormal results are displayed) Labs Reviewed  CBC WITH DIFFERENTIAL/PLATELET - Abnormal; Notable for the following components:      Result Value   RBC 4.21 (*)    Platelets 403 (*)    All other components within normal limits  COMPREHENSIVE METABOLIC PANEL WITH GFR - Abnormal; Notable for the following components:   Glucose, Bld 223 (*)    Creatinine, Ser 1.34 (*)    Albumin 3.3 (*)    GFR, Estimated 60 (*)    All other components within normal limits  CBG MONITORING, ED - Abnormal; Notable for the following components:    Glucose-Capillary 223 (*)    All other components within normal limits  CBG MONITORING, ED - Abnormal; Notable for the following components:   Glucose-Capillary 206 (*)    All other components within normal limits    EKG None  Radiology No results found.  Procedures Procedures    Medications Ordered in ED Medications  metFORMIN (GLUCOPHAGE) tablet 1,000 mg (1,000 mg Oral Given 06/08/23 0619)    ED Course/ Medical Decision Making/ A&P                                 Medical Decision Making Amount and/or Complexity of Data Reviewed Labs: ordered.  Risk Prescription drug management.   Mild hyperglycemia likely related to medication noncompliance. Has Rx for his meds just not the money so will give a dose here and can pick up meds when money available. Will hold on trulicity as we don't have it here.   Final Clinical Impression(s) / ED Diagnoses Final diagnoses:  Hyperglycemia    Rx / DC Orders ED Discharge Orders     None         Jerrie Schussler, Barbara Cower, MD 06/09/23 [phone]

## 2023-06-20 ENCOUNTER — Other Ambulatory Visit: Payer: Self-pay

## 2023-06-24 ENCOUNTER — Other Ambulatory Visit: Payer: Self-pay

## 2023-06-25 ENCOUNTER — Encounter (HOSPITAL_COMMUNITY): Payer: Self-pay

## 2023-06-25 ENCOUNTER — Other Ambulatory Visit: Payer: Self-pay

## 2023-06-25 ENCOUNTER — Emergency Department (HOSPITAL_COMMUNITY)
Admission: EM | Admit: 2023-06-25 | Discharge: 2023-06-25 | Disposition: A | Discharge status: Home / Self Care | Payer: MEDICAID | Attending: Emergency Medicine | Admitting: Emergency Medicine

## 2023-06-25 DIAGNOSIS — E1165 Type 2 diabetes mellitus with hyperglycemia: Secondary | ICD-10-CM | POA: Insufficient documentation

## 2023-06-25 DIAGNOSIS — R739 Hyperglycemia, unspecified: Secondary | ICD-10-CM

## 2023-06-25 DIAGNOSIS — R42 Dizziness and giddiness: Secondary | ICD-10-CM | POA: Insufficient documentation

## 2023-06-25 DIAGNOSIS — Z79899 Other long term (current) drug therapy: Secondary | ICD-10-CM | POA: Insufficient documentation

## 2023-06-25 DIAGNOSIS — Z7984 Long term (current) use of oral hypoglycemic drugs: Secondary | ICD-10-CM | POA: Insufficient documentation

## 2023-06-25 DIAGNOSIS — R5383 Other fatigue: Secondary | ICD-10-CM

## 2023-06-25 LAB — URINALYSIS, ROUTINE W REFLEX MICROSCOPIC
Bacteria, UA: NONE SEEN
Bilirubin Urine: NEGATIVE
Glucose, UA: >=500 mg/dL — AB
Hgb urine dipstick: NEGATIVE
Ketones, ur: NEGATIVE mg/dL
Leukocytes,Ua: NEGATIVE
Nitrite: NEGATIVE
Protein, ur: NEGATIVE mg/dL
Specific Gravity, Urine: 1.016 (ref 1.005–1.030)
pH: 6 (ref 5.0–8.0)

## 2023-06-25 LAB — BASIC METABOLIC PANEL WITH GFR
Anion gap: 10 (ref 5–15)
BUN: 12 mg/dL (ref 8–23)
CO2: 25 mmol/L (ref 22–32)
Calcium: 8.9 mg/dL (ref 8.9–10.3)
Chloride: 104 mmol/L (ref 98–111)
Creatinine, Ser: 1 mg/dL (ref 0.61–1.24)
GFR, Estimated: >60 mL/min (ref >60)
Glucose, Bld: 270 mg/dL — ABNORMAL HIGH (ref 70–99)
Potassium: 3.9 mmol/L (ref 3.5–5.1)
Sodium: 139 mmol/L (ref 135–145)

## 2023-06-25 LAB — CBC
HCT: 39.8 % (ref 39.0–52.0)
Hemoglobin: 13 g/dL (ref 13.0–17.0)
MCH: 31.3 pg (ref 26.0–34.0)
MCHC: 32.7 g/dL (ref 30.0–36.0)
MCV: 95.9 fL (ref 80.0–100.0)
Platelets: 314 10*3/uL (ref 150–400)
RBC: 4.15 MIL/uL — ABNORMAL LOW (ref 4.22–5.81)
RDW: 13.2 % (ref 11.5–15.5)
WBC: 7.6 10*3/uL (ref 4.0–10.5)
nRBC: 0 % (ref 0.0–0.2)

## 2023-06-25 LAB — CBG MONITORING, ED
Glucose-Capillary: 248 mg/dL — ABNORMAL HIGH (ref 70–99)
Glucose-Capillary: 329 mg/dL — ABNORMAL HIGH (ref 70–99)
Glucose-Capillary: 274 mg/dL — ABNORMAL HIGH (ref 70–99)

## 2023-06-25 MED ORDER — METFORMIN HCL 1000 MG PO TABS: 1000.0000 mg | ORAL_TABLET | Freq: Two times a day (BID) | ORAL | 0 refills | Status: DC

## 2023-06-25 MED ORDER — INSULIN ASPART 100 UNIT/ML IJ SOLN
5.0000 [IU] | Freq: Once | INTRAMUSCULAR | Status: AC
  Administered 2023-06-25: 5 [IU] via SUBCUTANEOUS

## 2023-06-25 MED ORDER — METFORMIN HCL 500 MG PO TABS
1000.0000 mg | ORAL_TABLET | Freq: Once | ORAL | Status: AC
  Administered 2023-06-25: 1000 mg via ORAL
  Filled 2023-06-25: qty 2

## 2023-06-25 MED ORDER — SODIUM CHLORIDE 0.9 % IV BOLUS
1000.0000 mL | Freq: Once | INTRAVENOUS | Status: AC
Start: 2023-06-25 — End: 2023-06-25
  Administered 2023-06-25: 1000 mL via INTRAVENOUS

## 2023-06-25 MED ORDER — GLIPIZIDE 10 MG PO TABS: 10.0000 mg | ORAL_TABLET | Freq: Every day | ORAL | 0 refills | Status: DC

## 2023-06-25 MED ORDER — METFORMIN HCL 1000 MG PO TABS
1000.0000 mg | ORAL_TABLET | Freq: Two times a day (BID) | ORAL | 0 refills | Status: DC
  Filled 2023-06-25: qty 30, 15d supply, fill #0

## 2023-06-25 NOTE — ED Provider Notes (Addendum)
 [place] EMERGENC[facility] DEPARTMENT AT [facility] Provider Note   CSN: [id number] Arrival date & time: 06/25/23  0201     History  Chief Complaint  Patient presents with   Dizziness    David Wyatt is a 63 y.o. male.  The history is provided by the patient and medical records. No language interpreter was used.  Urinary Frequency This is a recurrent problem. The problem occurs constantly. The problem has not changed since onset.Pertinent negatives include no chest pain, no abdominal pain and no shortness of breath. Nothing aggravates the symptoms. Nothing relieves the symptoms. He has tried nothing for the symptoms. The treatment provided no relief.       Home Medications Prior to Admission medications   Medication Sig Start Date End Date Taking? Authorizing Provider  amLODipine (NORVASC) 10 MG tablet Take 1 tablet (10 mg total) by mouth daily. 05/28/23   Lewanda Rife, MD  atorvastatin (LIPITOR) 40 MG tablet Take 1 tablet (40 mg total) by mouth daily. 05/28/23   Lewanda Rife, MD  cyclobenzaprine (FLEXERIL) 10 MG tablet Take 1 tablet (10 mg total) by mouth 2 (two) times daily as needed for muscle spasms. 05/28/23   Lewanda Rife, MD  DULoxetine (CYMBALTA) 60 MG capsule Take 1 capsule (60 mg total) by mouth daily. 05/28/23   Lewanda Rife, MD  gabapentin (NEURONTIN) 300 MG capsule Take 1 capsule (300 mg total) by mouth at bedtime. 05/28/23   Lewanda Rife, MD  glipiZIDE (GLUCOTROL) 10 MG tablet Take 1 tablet (10 mg total) by mouth daily before breakfast. 05/29/23   Lewanda Rife, MD  hydrOXYzine (ATARAX) 25 MG tablet Take 1 tablet (25 mg total) by mouth 3 (three) times daily as needed for anxiety. 05/28/23   Lewanda Rife, MD  loperamide (IMODIUM) 2 MG capsule Take 1 capsule (2 mg total) by mouth as needed for diarrhea or loose stools. 05/28/23   Lewanda Rife, MD  metFORMIN (GLUCOPHAGE) 1000 MG tablet Take 1 tablet (1,000 mg total) by mouth 2 (two)  times daily with a meal. 05/28/23   Lewanda Rife, MD  Multiple Vitamin (MULTIVITAMIN WITH MINERALS) TABS tablet Take 1 tablet by mouth daily. 05/28/23   Lewanda Rife, MD  naproxen (NAPROSYN) 250 MG tablet Take 1 tablet (250 mg total) by mouth 2 (two) times daily as needed (Pain). 05/28/23   Lewanda Rife, MD  nicotine (NICODERM CQ - DOSED IN MG/24 HOURS) 14 mg/24hr patch Place 1 patch (14 mg total) onto the skin daily. 05/28/23   Lewanda Rife, MD  traZODone (DESYREL) 50 MG tablet Take 1 tablet (50 mg total) by mouth at bedtime. 05/28/23   Lewanda Rife, MD      Allergies    Lisinopril    Review of Systems   Review of Systems  Constitutional:  Negative for chills, fatigue and fever.  HENT:  Negative for congestion.   Respiratory:  Negative for cough, chest tightness, shortness of breath and wheezing.   Cardiovascular:  Negative for chest pain, palpitations and leg swelling.  Gastrointestinal:  Negative for abdominal pain, constipation, diarrhea, nausea and vomiting.  Genitourinary:  Positive for frequency and urgency.  Musculoskeletal:  Positive for back pain (chronic and unchanged per pt). Negative for neck pain and neck stiffness.  Skin:  Negative for rash and wound.  Neurological:  Negative for syncope and light-headedness.  Psychiatric/Behavioral:  Negative for agitation and confusion.   All other systems reviewed and are negative.   Physical Exam Updated Vital Signs BP (!) 162/87  Pulse (!) 57   Temp 98.2 F (36.8 C) (Oral)   Resp 16   Ht 5\' 8"  (1.727 m)   Wt 65.8 kg   SpO2 99%   BMI 22.05 kg/m  Physical Exam Vitals and nursing note reviewed.  Constitutional:      General: He is not in acute distress.    Appearance: He is well-developed. He is not ill-appearing, toxic-appearing or diaphoretic.  HENT:     Head: Normocephalic and atraumatic.     Nose: No congestion or rhinorrhea.     Mouth/Throat:     Mouth: Mucous membranes are dry.     Pharynx: No  oropharyngeal exudate or posterior oropharyngeal erythema.  Eyes:     Extraocular Movements: Extraocular movements intact.     Conjunctiva/sclera: Conjunctivae normal.     Pupils: Pupils are equal, round, and reactive to light.  Cardiovascular:     Rate and Rhythm: Normal rate and regular rhythm.     Heart sounds: No murmur heard. Pulmonary:     Effort: Pulmonary effort is normal. No respiratory distress.     Breath sounds: Normal breath sounds. No wheezing, rhonchi or rales.  Chest:     Chest wall: No tenderness.  Abdominal:     Palpations: Abdomen is soft.     Tenderness: There is no abdominal tenderness. There is no right CVA tenderness, left CVA tenderness, guarding or rebound.  Musculoskeletal:        General: Tenderness present. No swelling or signs of injury.     Cervical back: Neck supple.     Right lower leg: No edema.     Left lower leg: No edema.  Skin:    General: Skin is warm and dry.     Capillary Refill: Capillary refill takes less than 2 seconds.     Coloration: Skin is not pale.     Findings: No erythema or rash.  Neurological:     General: No focal deficit present.     Mental Status: He is alert.     Sensory: No sensory deficit.     Motor: No weakness.  Psychiatric:        Mood and Affect: Mood normal.     ED Results / Procedures / Treatments   Labs (all labs ordered are listed, but only abnormal results are displayed) Labs Reviewed  BASIC METABOLIC PANEL WITH GFR - Abnormal; Notable for the following components:      Result Value   Glucose, Bld 270 (*)    All other components within normal limits  CBC - Abnormal; Notable for the following components:   RBC 4.15 (*)    All other components within normal limits  URINALYSIS, ROUTINE W REFLEX MICROSCOPIC - Abnormal; Notable for the following components:   Color, Urine STRAW (*)    Glucose, UA >=500 (*)    All other components within normal limits  CBG MONITORING, ED - Abnormal; Notable for the  following components:   Glucose-Capillary 248 (*)    All other components within normal limits  CBG MONITORING, ED - Abnormal; Notable for the following components:   Glucose-Capillary 329 (*)    All other components within normal limits  CBG MONITORING, ED - Abnormal; Notable for the following components:   Glucose-Capillary 274 (*)    All other components within normal limits    EKG EKG Interpretation Date/Time:  Wednesday June 25 2023 02:27:16 EDT Ventricular Rate:  66 PR Interval:  154 QRS Duration:  66 QT Interval:  450 QTC Calculation: 471 R Axis:   -18  Text Interpretation: Normal sinus rhythm Minimal voltage criteria for LVH, may be normal variant ( R in aVL ) Borderline ECG When compared with ECG of 18-May-2023 14:30, PREVIOUS ECG IS PRESENT when compared to prior, similar appearance. NO STEMI Confirmed by David Wyatt ([id number]) on 06/25/2023 11:27:08 AM  Radiology No results found.  Procedures Procedures    Medications Ordered in ED Medications  metFORMIN (GLUCOPHAGE) tablet 1,000 mg (1,000 mg Oral Given 06/25/23 0933)  sodium chloride 0.9 % bolus 1,000 mL (0 mLs Intravenous Stopped 06/25/23 1047)  insulin aspart (novoLOG) injection 5 Units (5 Units Subcutaneous Given 06/25/23 1330)    ED Course/ Medical Decision Making/ A&P                                 Medical Decision Making Amount and/or Complexity of Data Reviewed Labs: ordered.  Risk Prescription drug management.    David Wyatt is a 63 y.o. male with a past medical history significant for hypertension, hypercholesterolemia, previous polysubstance abuse, untreated diabetes, and peripheral neuropathies who presents for urinary frequency, urgency, and some fatigue.  Per EMS he presents for some lightheadedness after standing for a week.  He says that he has chronic back pain is not different than his baseline but has had some urinary changes.  He says he does not take his sugars and still cannot afford  any of his glucose medications.  Otherwise he denied fevers, chills, congestion, cough, nausea, vomiting, constipation, or diarrhea.  Denies trauma.  Denies pain other than his chronic back pains.  On exam, lungs clear.  Chest nontender.  Abdomen nontender.  Some tenderness in the musculature of his back and he has a cyst back there that is not focally tender.  Intact sensation, strength, and pulses.  Patient has dry mucous membranes and agrees he could be dehydrated.  Patient had labs during triage and he is not in DKA.  Glucose in the 200s.  Will give some fluids as I think he is dehydrated and he was slightly hyperglycemic.  Will get a urinalysis given his urinary changes.  Will check repeat glucose and we will give him a dose of his metformin.  If workup reassuring and he is feeling well with reassuring vital signs, anticipate discharge home.  1:06 PM Patient feeling much better after fluids.  He is resting comfortably.  He was given metformin.  He is still not urinated.  I went to discuss with patient he does not want to wait for repeat glucose and does not wait for urinalysis he would like to go home.  Will print a prescription of metformin and follow-up with his PCP.  Patient agrees with plan of care and return precautions and patient was discharged in good condition.   1:18 PM As we are preparing patient for discharge, he changed his mind and said he did want to get a urine and check his glucose.  He is now in the 300s,  1:21 PM Spoke to pharmacy and they recommended 5 units of subcu aspart to help treat the hyperglycemia now and reassess at.  Will get the urinalysis and anticipate discharge after.  2:29 PM Repeat glucose is back in the 200s and he is feeling well.  Urinalysis did not show UTI.  Will discharge per previous plan and will print prescriptions for him for both his metformin and his glipizide.  Patient understood  return precautions and follow-up instructions and was  discharged in good condition.   Final Clinical Impression(s) / ED Diagnoses Final diagnoses:  Fatigue, unspecified type  Hyperglycemia    Clinical Impression: 1. Fatigue, unspecified type   2. Hyperglycemia     Disposition: Discharge  Condition: Good  I have discussed the results, Dx and Tx plan with the pt(& family if present). He/she/they expressed understanding and agree(s) with the plan. Discharge instructions discussed at great length. Strict return precautions discussed and pt &/or family have verbalized understanding of the instructions. No further questions at time of discharge.    New Prescriptions   GLIPIZIDE (GLUCOTROL) 10 MG TABLET    Take 1 tablet (10 mg total) by mouth daily before breakfast.   METFORMIN (GLUCOPHAGE) 1000 MG TABLET    Take 1 tablet (1,000 mg total) by mouth 2 (two) times daily.    Follow Up: David Mulberry, MD [address] [place] [address] [phone]     [facility] Emergency Department at [facility] [address] [place]  [phone]        David Wyatt, Marine Sia, MD 06/25/23 [phone]

## 2023-06-25 NOTE — ED Notes (Signed)
 Patient finished food with no difficulty

## 2023-06-25 NOTE — ED Notes (Signed)
 Discharge paperwork gone over with patient with importance placed on monitoring his blood sugar and picking up prescriptions. Patient verbalized understanding. Ambulatory to exit without difficulty.

## 2023-06-25 NOTE — ED Notes (Signed)
 Patient unable to provider urine sample at this time. Urinal placed at bedside.

## 2023-06-25 NOTE — Discharge Instructions (Addendum)
 Your history, exam, workup today did show your glucose is elevated.  The rest of your labs are overall reassuring.  We had a shared decision-making conversation offering on rechecking glucose and also awaiting urinalysis but given your improvement after some fluids and improvement stability for over 11 hours here, we agree with plan for discharge home.  Please follow-up with your primary doctor and start taking your metformin and glipizide.  Please rest and stay hydrated.  If any symptoms change or worsen acutely, please return to the nearest emergency department.

## 2023-06-25 NOTE — ED Provider Triage Note (Signed)
 Emergency Medicine Provider Triage Evaluation Note  David Wyatt , a 63 y.o. male  was evaluated in triage.  Pt complains of not knowing why he is here.  He states is cold outside and he has diabetes.  He also states when he stands, he gets lightheaded.  Is not on dialysis.  States "Bitch I do not know why I am here." Denies headache.  Review of Systems  Positive: lightheadedness Negative: headache  Physical Exam  BP (!) 151/113 (BP Location: Right Arm)   Pulse 69   Temp 98.4 F (36.9 C) (Oral)   Resp 18   Ht 5\' 8"  (1.727 m)   Wt 65.8 kg   SpO2 99%   BMI 22.05 kg/m  Gen:   Awake, no distress   Resp:  Normal effort  MSK:   Moves extremities without difficulty  Other:    Medical Decision Making  Medically screening exam initiated at 2:33 AM.  Appropriate orders placed.  David Wyatt was informed that the remainder of the evaluation will be completed by another provider, this initial triage assessment does not replace that evaluation, and the importance of remaining in the ED until their evaluation is complete.     David Wyatt, [place] 06/25/23 [phone]

## 2023-06-25 NOTE — ED Notes (Signed)
 Patient unable to provider urine sample at this time. Urinal remains at bedside.

## 2023-06-25 NOTE — ED Notes (Signed)
 Patient given sandwich and Sprite for PO challenge after MD's approval.

## 2023-06-25 NOTE — ED Triage Notes (Signed)
 Pt arrived per EMS from outside on the street with c/o dizziness upon standing for a week.  Sinus arrythmias and slightly prolonged QT interval. He reports a hard cyst on the back of his neck that has been there for a long time. A&Ox4. Denies LOC. Pain in the neck. Skin warm and dry.   VS BP 124/80, HR 72, SpO2 97% RA, RR 18, cbg 266  18 gauge in L AC.  Received 500 CCs of NS en route.

## 2023-06-29 ENCOUNTER — Encounter (HOSPITAL_COMMUNITY): Payer: Self-pay | Admitting: *Deleted

## 2023-06-29 ENCOUNTER — Other Ambulatory Visit: Payer: Self-pay

## 2023-06-29 ENCOUNTER — Emergency Department (HOSPITAL_COMMUNITY)
Admission: EM | Admit: 2023-06-29 | Discharge: 2023-06-30 | Disposition: A | Discharge status: Home / Self Care | Payer: MEDICAID | Attending: Emergency Medicine | Admitting: Emergency Medicine

## 2023-06-29 DIAGNOSIS — Z5900 Homelessness unspecified: Secondary | ICD-10-CM

## 2023-06-29 DIAGNOSIS — Z7984 Long term (current) use of oral hypoglycemic drugs: Secondary | ICD-10-CM | POA: Diagnosis not present

## 2023-06-29 DIAGNOSIS — F1494 Cocaine use, unspecified with cocaine-induced mood disorder: Secondary | ICD-10-CM | POA: Diagnosis present

## 2023-06-29 DIAGNOSIS — E119 Type 2 diabetes mellitus without complications: Secondary | ICD-10-CM | POA: Diagnosis not present

## 2023-06-29 DIAGNOSIS — Z5901 Sheltered homelessness: Secondary | ICD-10-CM

## 2023-06-29 DIAGNOSIS — F1994 Other psychoactive substance use, unspecified with psychoactive substance-induced mood disorder: Secondary | ICD-10-CM | POA: Diagnosis present

## 2023-06-29 DIAGNOSIS — F32A Depression, unspecified: Secondary | ICD-10-CM

## 2023-06-29 DIAGNOSIS — I10 Essential (primary) hypertension: Secondary | ICD-10-CM | POA: Diagnosis not present

## 2023-06-29 DIAGNOSIS — Z79899 Other long term (current) drug therapy: Secondary | ICD-10-CM | POA: Diagnosis not present

## 2023-06-29 DIAGNOSIS — F1490 Cocaine use, unspecified, uncomplicated: Secondary | ICD-10-CM | POA: Diagnosis present

## 2023-06-29 LAB — CBC WITH DIFFERENTIAL/PLATELET
Abs Immature Granulocytes: 0.02 10*3/uL (ref 0.00–0.07)
Basophils Absolute: 0.1 10*3/uL (ref 0.0–0.1)
Basophils Relative: 1 %
Eosinophils Absolute: 0.2 10*3/uL (ref 0.0–0.5)
Eosinophils Relative: 4 %
HCT: 38.6 % — ABNORMAL LOW (ref 39.0–52.0)
Hemoglobin: 12.8 g/dL — ABNORMAL LOW (ref 13.0–17.0)
Immature Granulocytes: 0 %
Lymphocytes Relative: 32 %
Lymphs Abs: 2 10*3/uL (ref 0.7–4.0)
MCH: 32 pg (ref 26.0–34.0)
MCHC: 33.2 g/dL (ref 30.0–36.0)
MCV: 96.5 fL (ref 80.0–100.0)
Monocytes Absolute: 0.5 10*3/uL (ref 0.1–1.0)
Monocytes Relative: 7 %
Neutro Abs: 3.5 10*3/uL (ref 1.7–7.7)
Neutrophils Relative %: 56 %
Platelets: 286 10*3/uL (ref 150–400)
RBC: 4 MIL/uL — ABNORMAL LOW (ref 4.22–5.81)
RDW: 13.5 % (ref 11.5–15.5)
WBC: 6.3 10*3/uL (ref 4.0–10.5)
nRBC: 0 % (ref 0.0–0.2)

## 2023-06-29 LAB — ACETAMINOPHEN LEVEL: Acetaminophen (Tylenol), Serum: <10 ug/mL — ABNORMAL LOW (ref 10–30)

## 2023-06-29 LAB — COMPREHENSIVE METABOLIC PANEL WITH GFR
ALT: 20 U/L (ref 0–44)
AST: 17 U/L (ref 15–41)
Albumin: 3.3 g/dL — ABNORMAL LOW (ref 3.5–5.0)
Alkaline Phosphatase: 92 U/L (ref 38–126)
Anion gap: 9 (ref 5–15)
BUN: 17 mg/dL (ref 8–23)
CO2: 24 mmol/L (ref 22–32)
Calcium: 8.9 mg/dL (ref 8.9–10.3)
Chloride: 106 mmol/L (ref 98–111)
Creatinine, Ser: 1.39 mg/dL — ABNORMAL HIGH (ref 0.61–1.24)
GFR, Estimated: 57 mL/min — ABNORMAL LOW (ref >60)
Glucose, Bld: 297 mg/dL — ABNORMAL HIGH (ref 70–99)
Potassium: 4.2 mmol/L (ref 3.5–5.1)
Sodium: 139 mmol/L (ref 135–145)
Total Bilirubin: 0.7 mg/dL (ref 0.0–1.2)
Total Protein: 6.6 g/dL (ref 6.5–8.1)

## 2023-06-29 LAB — RAPID URINE DRUG SCREEN, HOSP PERFORMED
Amphetamines: NOT DETECTED
Barbiturates: NOT DETECTED
Benzodiazepines: NOT DETECTED
Cocaine: POSITIVE — AB
Opiates: NOT DETECTED
Tetrahydrocannabinol: NOT DETECTED

## 2023-06-29 LAB — ETHANOL: Alcohol, Ethyl (B): <10 mg/dL (ref <10)

## 2023-06-29 LAB — SALICYLATE LEVEL: Salicylate Lvl: <7 mg/dL — ABNORMAL LOW (ref 7.0–30.0)
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
## 2023-06-29 NOTE — ED Notes (Signed)
Pt belongings placed in locker #7.  

## 2023-06-29 NOTE — ED Notes (Signed)
 The pt admits to last cocaine was yesterday no alcohol  falls asleep when not stimulated
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
## 2023-06-29 NOTE — ED Triage Notes (Signed)
 The pt reports of depression and suicidal for ?? Days he also reports that he has rt arm and rt leg pain  he is very dirty

## 2023-06-29 NOTE — ED Notes (Signed)
 Pt given sandwich bag with crackers and a soda.

## 2023-06-30 ENCOUNTER — Other Ambulatory Visit: Payer: Self-pay

## 2023-06-30 ENCOUNTER — Ambulatory Visit (HOSPITAL_COMMUNITY)
Admission: EM | Admit: 2023-06-30 | Discharge: 2023-07-03 | Disposition: A | Discharge status: Home / Self Care | Payer: MEDICAID | Attending: Psychiatry | Admitting: Psychiatry

## 2023-06-30 DIAGNOSIS — I10 Essential (primary) hypertension: Secondary | ICD-10-CM

## 2023-06-30 DIAGNOSIS — R45851 Suicidal ideations: Secondary | ICD-10-CM | POA: Insufficient documentation

## 2023-06-30 DIAGNOSIS — E1165 Type 2 diabetes mellitus with hyperglycemia: Secondary | ICD-10-CM

## 2023-06-30 DIAGNOSIS — Z7984 Long term (current) use of oral hypoglycemic drugs: Secondary | ICD-10-CM | POA: Insufficient documentation

## 2023-06-30 DIAGNOSIS — F1420 Cocaine dependence, uncomplicated: Secondary | ICD-10-CM | POA: Insufficient documentation

## 2023-06-30 DIAGNOSIS — Z79899 Other long term (current) drug therapy: Secondary | ICD-10-CM | POA: Insufficient documentation

## 2023-06-30 DIAGNOSIS — Z794 Long term (current) use of insulin: Secondary | ICD-10-CM | POA: Insufficient documentation

## 2023-06-30 DIAGNOSIS — F1994 Other psychoactive substance use, unspecified with psychoactive substance-induced mood disorder: Secondary | ICD-10-CM

## 2023-06-30 DIAGNOSIS — Z765 Malingerer [conscious simulation]: Secondary | ICD-10-CM | POA: Insufficient documentation

## 2023-06-30 DIAGNOSIS — Z5900 Homelessness unspecified: Secondary | ICD-10-CM | POA: Insufficient documentation

## 2023-06-30 LAB — POCT URINE DRUG SCREEN - MANUAL ENTRY (I-SCREEN)
POC Amphetamine UR: NOT DETECTED
POC Buprenorphine (BUP): NOT DETECTED
POC Cocaine UR: POSITIVE — AB
POC Marijuana UR: NOT DETECTED
POC Methadone UR: NOT DETECTED
POC Methamphetamine UR: NOT DETECTED
POC Morphine: NOT DETECTED
POC Oxazepam (BZO): NOT DETECTED
POC Oxycodone UR: NOT DETECTED
POC Secobarbital (BAR): NOT DETECTED

## 2023-06-30 MED ORDER — ACETAMINOPHEN 500 MG PO TABS
1000.0000 mg | ORAL_TABLET | Freq: Once | ORAL | Status: DC
  Filled 2023-06-30: qty 2

## 2023-06-30 MED ORDER — OLANZAPINE 10 MG IM SOLR: 10.0000 mg | Freq: Three times a day (TID) | INTRAMUSCULAR | Status: DC | PRN

## 2023-06-30 MED ORDER — OLANZAPINE 5 MG PO TBDP: 5.0000 mg | ORAL_TABLET | Freq: Three times a day (TID) | ORAL | Status: DC | PRN

## 2023-06-30 MED ORDER — MAGNESIUM HYDROXIDE 400 MG/5ML PO SUSP: 30.0000 mL | Freq: Every day | ORAL | Status: DC | PRN

## 2023-06-30 MED ORDER — ALUM & MAG HYDROXIDE-SIMETH 200-200-20 MG/5ML PO SUSP: 30.0000 mL | ORAL | Status: DC | PRN

## 2023-06-30 MED ORDER — OLANZAPINE 10 MG IM SOLR: 5.0000 mg | Freq: Three times a day (TID) | INTRAMUSCULAR | Status: DC | PRN

## 2023-06-30 MED ORDER — ACETAMINOPHEN 325 MG PO TABS: 650.0000 mg | ORAL_TABLET | Freq: Four times a day (QID) | ORAL | Status: DC | PRN

## 2023-06-30 NOTE — Discharge Instructions (Addendum)
 Please present to [facility] for walk in assessment due to relapse on crack cocaine. Pt UDS positive for cocaine on 06/29/23.    Outpatient psychiatric Services  Walk in hours for medication management Monday, Wednesday, Thursday, and Friday from 8:00 AM to 11:00 AM Recommend arriving by by 7:30 AM.  It is first come first serve.    Walk in hours for therapy intake Monday and Wednesday only 8:00 AM to 11:00 AM Encouraged to arrive by 7:30 AM.  It is first come first serve   Inpatient patient psychiatric services [facility] offers comprehensive behavioral heath care services for mental health and substance abuse treatment.  Social work can also assist with referral to or getting you into a rehabilitation program short or long term

## 2023-06-30 NOTE — ED Notes (Signed)
 Pt awake and eating [place] sandwich. RR even and nonlabored. No acute distress noted.
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
## 2023-06-30 NOTE — BH Assessment (Signed)
 Per [facility], pt will be seen by in house provider due to their coverage hours.
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
## 2023-06-30 NOTE — ED Provider Notes (Signed)
 [facility] Continuous Assessment Admission H&P  Date: 07/01/23 Patient Name: David Wyatt MRN: [id number] Chief Complaint: homeless and suicidal  Diagnoses:  Final diagnoses:  Malingering  Suicidal thoughts  Homelessness unspecified    HPI: David Wyatt, 63 y/o male with a history of substance abuse, homelessness, SI.  Presented to [facility] voluntarily.  Review of patient records show the patient was recently discharged from [facility].  Records also show that patient was also at [facility], ED.  [facility] but according to patient he got there late.  Patient does appear to be malingering and seeking secondary gain because of homelessness.  According to patient he lost his Apt. 2 weeks ago.  And is on a waiting list to get an apartment. According to patient he move to the area back in the 90's from [place].  Pt stated he has children but he has not communicated with them in over 40 yrs.   Face-to-face evaluation of patient, patient is alert and oriented x 4, speech is clear, maintain eye contact.  Patient is observed using a rolling walker with ambulation.  Patient does state he was suicidal but he had no plans, denies HI, AVH or paranoia.  According to him he has not used any crack cocaine in a couple of days.  Patient does appear to be seeking secondary gain due to homelessness.  However given patient prior history.  We will admit patient for observation until the patient can get to go to day [facility].  Observation unit  Total Time spent with patient: 20 minutes  Musculoskeletal  Strength & Muscle Tone: within normal limits Gait & Station: normal Patient leans: N/A  Psychiatric Specialty Exam  Presentation General Appearance:  Appropriate for Environment  Eye Contact: Good  Speech: Clear and Coherent  Speech Volume: Normal  Handedness: Right   Mood and Affect  Mood: Depressed  Affect: Congruent   Thought Process  Thought  Processes: Coherent  Descriptions of Associations:Intact  Orientation:Full (Time, Place and Person)  Thought Content:Logical  Diagnosis of Schizophrenia or Schizoaffective disorder in past: No   Hallucinations:Hallucinations: None  Ideas of Reference:None  Suicidal Thoughts:Suicidal Thoughts: Yes, Passive SI Passive Intent and/or Plan: Without Intent; Without Plan  Homicidal Thoughts:Homicidal Thoughts: No   Sensorium  Memory: Immediate Fair  Judgment: Fair  Insight: Shallow   Executive Functions  Concentration: Fair  Attention Span: Fair  Recall: Fair  Fund of Knowledge: Fair  Language: Fair   Psychomotor Activity  Psychomotor Activity: Psychomotor Activity: Normal   Assets  Assets: Desire for Improvement; Housing; Vocational/Educational   Sleep  Sleep: Sleep: Fair Number of Hours of Sleep: 7   Nutritional Assessment (For OBS and FBC admissions only) Has the patient had a weight loss or gain of 10 pounds or more in the last 3 months?: No Has the patient had a decrease in food intake/or appetite?: No Does the patient have dental problems?: No Does the patient have eating habits or behaviors that may be indicators of an eating disorder including binging or inducing vomiting?: No Has the patient recently lost weight without trying?: 0 Has the patient been eating poorly because of a decreased appetite?: 0 Malnutrition Screening Tool Score: 0    Physical Exam HENT:     Head: Normocephalic.     Nose: Nose normal.  Eyes:     Pupils: Pupils are equal, round, and reactive to light.  Cardiovascular:     Rate and Rhythm: Normal rate.  Pulmonary:  Effort: Pulmonary effort is normal.  Musculoskeletal:        General: Normal range of motion.     Cervical back: Normal range of motion.  Neurological:     General: No focal deficit present.     Mental Status: He is alert.  Psychiatric:        Mood and Affect: Mood normal.        Behavior:  Behavior normal.        Thought Content: Thought content normal.        Judgment: Judgment normal.    Review of Systems  Constitutional: Negative.   HENT: Negative.    Eyes: Negative.   Respiratory: Negative.    Cardiovascular: Negative.   Gastrointestinal: Negative.   Genitourinary: Negative.   Musculoskeletal: Negative.   Skin: Negative.   Neurological: Negative.   Psychiatric/Behavioral:  Positive for substance abuse and suicidal ideas. The patient is nervous/anxious.     Blood pressure 132/66, pulse 77, temperature 98.6 F (37 C), temperature source Oral, resp. rate 18, SpO2 99%. There is no height or weight on file to calculate BMI.  Past Psychiatric History: SI. Homelessness, substance abuse   Is the patient at risk to self? Yes  Has the patient been a risk to self in the past 6 months? Yes .    Has the patient been a risk to self within the distant past? Yes   Is the patient a risk to others? No   Has the patient been a risk to others in the past 6 months? No   Has the patient been a risk to others within the distant past? No   Past Medical History: see chart   Family History: unknown   Social History: cocaine abuse,   Last Labs:  Admission on 06/30/2023  Component Date Value Ref Range Status   POC Amphetamine UR 06/30/2023 None Detected  NONE DETECTED (Cut Off Level 1000 ng/mL) Final   POC Secobarbital (BAR) 06/30/2023 None Detected  NONE DETECTED (Cut Off Level 300 ng/mL) Final   POC Buprenorphine (BUP) 06/30/2023 None Detected  NONE DETECTED (Cut Off Level 10 ng/mL) Final   POC Oxazepam (BZO) 06/30/2023 None Detected  NONE DETECTED (Cut Off Level 300 ng/mL) Final   POC Cocaine UR 06/30/2023 Positive (A)  NONE DETECTED (Cut Off Level 300 ng/mL) Final   POC Methamphetamine UR 06/30/2023 None Detected  NONE DETECTED (Cut Off Level 1000 ng/mL) Final   POC Morphine  06/30/2023 None Detected  NONE DETECTED (Cut Off Level 300 ng/mL) Final   POC Methadone UR 06/30/2023  None Detected  NONE DETECTED (Cut Off Level 300 ng/mL) Final   POC Oxycodone  UR 06/30/2023 None Detected  NONE DETECTED (Cut Off Level 100 ng/mL) Final   POC Marijuana UR 06/30/2023 None Detected  NONE DETECTED (Cut Off Level 50 ng/mL) Final  Admission on 06/29/2023, Discharged on 06/30/2023  Component Date Value Ref Range Status   Sodium 06/29/2023 139  135 - 145 mmol/L Final   Potassium 06/29/2023 4.2  3.5 - 5.1 mmol/L Final   Chloride 06/29/2023 106  98 - 111 mmol/L Final   CO2 06/29/2023 24  22 - 32 mmol/L Final   Glucose, Bld 06/29/2023 297 (H)  70 - 99 mg/dL Final   Glucose reference range applies only to samples taken after fasting for at least 8 hours.   BUN 06/29/2023 17  8 - 23 mg/dL Final   Creatinine, Ser 06/29/2023 1.39 (H)  0.61 - 1.24 mg/dL Final   Calcium   06/29/2023 8.9  8.9 - 10.3 mg/dL Final   Total Protein 40/98/1191 6.6  6.5 - 8.1 g/dL Final   Albumin 47/82/9562 3.3 (L)  3.5 - 5.0 g/dL Final   AST 13/10/6576 17  15 - 41 U/L Final   ALT 06/29/2023 20  0 - 44 U/L Final   Alkaline Phosphatase 06/29/2023 92  38 - 126 U/L Final   Total Bilirubin 06/29/2023 0.7  0.0 - 1.2 mg/dL Final   GFR, Estimated 06/29/2023 57 (L)  >60 mL/min Final   Comment: (NOTE) Calculated using the CKD-EPI Creatinine Equation (2021)    Anion gap 06/29/2023 9  5 - 15 Final   Performed at [facility], [address], [place], [place] [address]   Alcohol, Ethyl (B) 06/29/2023 <10  <10 mg/dL Final   Comment: (NOTE) For medical purposes only. Performed at [facility], [address] [place], [place] [address]    Opiates 06/29/2023 NONE DETECTED  NONE DETECTED Final   Cocaine 06/29/2023 POSITIVE (A)  NONE DETECTED Final   Benzodiazepines 06/29/2023 NONE DETECTED  NONE DETECTED Final   Amphetamines 06/29/2023 NONE DETECTED  NONE DETECTED Final   Tetrahydrocannabinol 06/29/2023 NONE DETECTED  NONE DETECTED Final   Barbiturates 06/29/2023 NONE DETECTED  NONE DETECTED Final   Comment:  (NOTE) DRUG SCREEN FOR MEDICAL PURPOSES ONLY.  IF CONFIRMATION IS NEEDED FOR ANY PURPOSE, NOTIFY LAB WITHIN 5 DAYS.  LOWEST DETECTABLE LIMITS FOR URINE DRUG SCREEN Drug Class                     Cutoff (ng/mL) Amphetamine and metabolites    1000 Barbiturate and metabolites    200 Benzodiazepine                 200 Opiates and metabolites        300 Cocaine and metabolites        300 THC                            50 Performed at [facility], [address] [place], [place] [address]    WBC 06/29/2023 6.3  4.0 - 10.5 K/uL Final   RBC 06/29/2023 4.00 (L)  4.22 - 5.81 MIL/uL Final   Hemoglobin 06/29/2023 12.8 (L)  13.0 - 17.0 g/dL Final   HCT 03/13/7251 38.6 (L)  39.0 - 52.0 % Final   MCV 06/29/2023 96.5  80.0 - 100.0 fL Final   MCH 06/29/2023 32.0  26.0 - 34.0 pg Final   MCHC 06/29/2023 33.2  30.0 - 36.0 g/dL Final   RDW 66/44/0347 13.5  11.5 - 15.5 % Final   Platelets 06/29/2023 286  150 - 400 K/uL Final   nRBC 06/29/2023 0.0  0.0 - 0.2 % Final   Neutrophils Relative % 06/29/2023 56  % Final   Neutro Abs 06/29/2023 3.5  1.7 - 7.7 K/uL Final   Lymphocytes Relative 06/29/2023 32  % Final   Lymphs Abs 06/29/2023 2.0  0.7 - 4.0 K/uL Final   Monocytes Relative 06/29/2023 7  % Final   Monocytes Absolute 06/29/2023 0.5  0.1 - 1.0 K/uL Final   Eosinophils Relative 06/29/2023 4  % Final   Eosinophils Absolute 06/29/2023 0.2  0.0 - 0.5 K/uL Final   Basophils Relative 06/29/2023 1  % Final   Basophils Absolute 06/29/2023 0.1  0.0 - 0.1 K/uL Final   Immature Granulocytes 06/29/2023 0  %  Final   Abs Immature Granulocytes 06/29/2023 0.02  0.00 - 0.07 K/uL Final   Performed at [facility], [address] [place], [place] [address]   Salicylate Lvl 06/29/2023 <7.0 (L)  7.0 - 30.0 mg/dL Final   Performed at [facility], [address] [place], [place] [address]   Acetaminophen  (Tylenol ), Serum 06/29/2023 <10 (L)  10 - 30 ug/mL Final   Comment: (NOTE) Therapeutic  concentrations vary significantly. A range of 10-30 ug/mL  may be an effective concentration for many patients. However, some  are best treated at concentrations outside of this range. Acetaminophen  concentrations >150 ug/mL at 4 hours after ingestion  and >50 ug/mL at 12 hours after ingestion are often associated with  toxic reactions.  Performed at [facility], [address], [place], [place] [address]   Admission on 06/25/2023, Discharged on 06/25/2023  Component Date Value Ref Range Status   Sodium 06/25/2023 139  135 - 145 mmol/L Final   Potassium 06/25/2023 3.9  3.5 - 5.1 mmol/L Final   Chloride 06/25/2023 104  98 - 111 mmol/L Final   CO2 06/25/2023 25  22 - 32 mmol/L Final   Glucose, Bld 06/25/2023 270 (H)  70 - 99 mg/dL Final   Glucose reference range applies only to samples taken after fasting for at least 8 hours.   BUN 06/25/2023 12  8 - 23 mg/dL Final   Creatinine, Ser 06/25/2023 1.00  0.61 - 1.24 mg/dL Final   Calcium  06/25/2023 8.9  8.9 - 10.3 mg/dL Final   GFR, Estimated 06/25/2023 >60  >60 mL/min Final   Comment: (NOTE) Calculated using the CKD-EPI Creatinine Equation (2021)    Anion gap 06/25/2023 10  5 - 15 Final   Performed at [facility], [address], [place], [place] [address]   WBC 06/25/2023 7.6  4.0 - 10.5 K/uL Final   RBC 06/25/2023 4.15 (L)  4.22 - 5.81 MIL/uL Final   Hemoglobin 06/25/2023 13.0  13.0 - 17.0 g/dL Final   HCT 96/29/5284 39.8  39.0 - 52.0 % Final   MCV 06/25/2023 95.9  80.0 - 100.0 fL Final   MCH 06/25/2023 31.3  26.0 - 34.0 pg Final   MCHC 06/25/2023 32.7  30.0 - 36.0 g/dL Final   RDW 13/24/4010 13.2  11.5 - 15.5 % Final   Platelets 06/25/2023 314  150 - 400 K/uL Final   nRBC 06/25/2023 0.0  0.0 - 0.2 % Final   Performed at [facility], [address], [place], [place] [address]   Color, Urine 06/25/2023 STRAW (A)  YELLOW Final   APPearance 06/25/2023 CLEAR  CLEAR Final   Specific Gravity, Urine 06/25/2023 1.016   1.005 - 1.030 Final   pH 06/25/2023 6.0  5.0 - 8.0 Final   Glucose, UA 06/25/2023 >=500 (A)  NEGATIVE mg/dL Final   Hgb urine dipstick 06/25/2023 NEGATIVE  NEGATIVE Final   Bilirubin Urine 06/25/2023 NEGATIVE  NEGATIVE Final   Ketones, ur 06/25/2023 NEGATIVE  NEGATIVE mg/dL Final   Protein, ur 66/44/0347 NEGATIVE  NEGATIVE mg/dL Final   Nitrite 42/59/5638 NEGATIVE  NEGATIVE Final   Leukocytes,Ua 06/25/2023 NEGATIVE  NEGATIVE Final   RBC / HPF 06/25/2023 0-5  0 - 5 RBC/hpf Final   WBC, UA 06/25/2023 0-5  0 - 5 WBC/hpf Final   Bacteria, UA 06/25/2023 NONE SEEN  NONE SEEN Final   Squamous Epithelial / HPF 06/25/2023 0-5  0 - 5 /HPF Final  Mucus 06/25/2023 PRESENT   Final   Performed at [facility], [address], [place], [place] [address]   Glucose-Capillary 06/25/2023 248 (H)  70 - 99 mg/dL Final   Glucose reference range applies only to samples taken after fasting for at least 8 hours.   Glucose-Capillary 06/25/2023 329 (H)  70 - 99 mg/dL Final   Glucose reference range applies only to samples taken after fasting for at least 8 hours.   Glucose-Capillary 06/25/2023 274 (H)  70 - 99 mg/dL Final   Glucose reference range applies only to samples taken after fasting for at least 8 hours.  [facility] on 06/09/2023  Component Date Value Ref Range Status   POC Glucose 06/09/2023 332 (A)  70 - 99 mg/dl Final  Admission on 19/14/7829, Discharged on 06/08/2023  Component Date Value Ref Range Status   Glucose-Capillary 06/08/2023 223 (H)  70 - 99 mg/dL Final   Glucose reference range applies only to samples taken after fasting for at least 8 hours.   WBC 06/08/2023 9.6  4.0 - 10.5 K/uL Final   RBC 06/08/2023 4.21 (L)  4.22 - 5.81 MIL/uL Final   Hemoglobin 06/08/2023 13.2  13.0 - 17.0 g/dL Final   HCT 56/21/3086 40.5  39.0 - 52.0 % Final   MCV 06/08/2023 96.2  80.0 - 100.0 fL Final   MCH 06/08/2023 31.4  26.0 - 34.0 pg Final   MCHC 06/08/2023 32.6  30.0 - 36.0 g/dL  Final   RDW 57/84/6962 13.7  11.5 - 15.5 % Final   Platelets 06/08/2023 403 (H)  150 - 400 K/uL Final   nRBC 06/08/2023 0.0  0.0 - 0.2 % Final   Neutrophils Relative % 06/08/2023 67  % Final   Neutro Abs 06/08/2023 6.4  1.7 - 7.7 K/uL Final   Lymphocytes Relative 06/08/2023 24  % Final   Lymphs Abs 06/08/2023 2.3  0.7 - 4.0 K/uL Final   Monocytes Relative 06/08/2023 6  % Final   Monocytes Absolute 06/08/2023 0.6  0.1 - 1.0 K/uL Final   Eosinophils Relative 06/08/2023 2  % Final   Eosinophils Absolute 06/08/2023 0.2  0.0 - 0.5 K/uL Final   Basophils Relative 06/08/2023 1  % Final   Basophils Absolute 06/08/2023 0.1  0.0 - 0.1 K/uL Final   Immature Granulocytes 06/08/2023 0  % Final   Abs Immature Granulocytes 06/08/2023 0.04  0.00 - 0.07 K/uL Final   Performed at [facility], [address], [place], [place] [address]   Sodium 06/08/2023 140  135 - 145 mmol/L Final   Potassium 06/08/2023 4.5  3.5 - 5.1 mmol/L Final   Chloride 06/08/2023 104  98 - 111 mmol/L Final   CO2 06/08/2023 23  22 - 32 mmol/L Final   Glucose, Bld 06/08/2023 223 (H)  70 - 99 mg/dL Final   Glucose reference range applies only to samples taken after fasting for at least 8 hours.   BUN 06/08/2023 20  8 - 23 mg/dL Final   Creatinine, Ser 06/08/2023 1.34 (H)  0.61 - 1.24 mg/dL Final   Calcium  06/08/2023 9.3  8.9 - 10.3 mg/dL Final   Total Protein 13/24/4010 7.2  6.5 - 8.1 g/dL Final   Albumin 27/25/3664 3.3 (L)  3.5 - 5.0 g/dL Final   AST 40/34/7425 20  15 - 41 U/L Final   ALT 06/08/2023 19  0 - 44 U/L Final   Alkaline Phosphatase 06/08/2023 85  38 - 126 U/L Final  Total Bilirubin 06/08/2023 0.6  0.0 - 1.2 mg/dL Final   GFR, Estimated 06/08/2023 60 (L)  >60 mL/min Final   Comment: (NOTE) Calculated using the CKD-EPI Creatinine Equation (2021)    Anion gap 06/08/2023 13  5 - 15 Final   Performed at [facility], [address], [place], [place] [address]   Glucose-Capillary 06/08/2023 206 (H)  70 -  99 mg/dL Final   Glucose reference range applies only to samples taken after fasting for at least 8 hours.  Admission on 05/19/2023, Discharged on 05/28/2023  Component Date Value Ref Range Status   Alcohol, Ethyl (B) 05/19/2023 <10  <10 mg/dL Final   Comment: (NOTE) Lowest detectable limit for serum alcohol is 10 mg/dL.  For medical purposes only. Performed at [facility], [address] [place], [place] [address]    Glucose-Capillary 05/19/2023 420 (H)  70 - 99 mg/dL Final   Glucose reference range applies only to samples taken after fasting for at least 8 hours.   Glucose, Bld 05/19/2023 124 (H)  70 - 99 mg/dL Final   Comment: Glucose reference range applies only to samples taken after fasting for at least 8 hours. Performed at [facility], [address] [place], [place] [address]    Glucose-Capillary 05/19/2023 86  70 - 99 mg/dL Final   Glucose reference range applies only to samples taken after fasting for at least 8 hours.   Glucose-Capillary 05/19/2023 282 (H)  70 - 99 mg/dL Final   Glucose reference range applies only to samples taken after fasting for at least 8 hours.   Glucose-Capillary 05/19/2023 141 (H)  70 - 99 mg/dL Final   Glucose reference range applies only to samples taken after fasting for at least 8 hours.   Glucose-Capillary 05/20/2023 169 (H)  70 - 99 mg/dL Final   Glucose reference range applies only to samples taken after fasting for at least 8 hours.   Glucose-Capillary 05/20/2023 66 (L)  70 - 99 mg/dL Final   Glucose reference range applies only to samples taken after fasting for at least 8 hours.   Glucose-Capillary 05/20/2023 197 (H)  70 - 99 mg/dL Final   Glucose reference range applies only to samples taken after fasting for at least 8 hours.   Glucose-Capillary 05/20/2023 438 (H)  70 - 99 mg/dL Final   Glucose reference range applies only to samples taken after fasting for at least 8 hours.   Glucose-Capillary 05/20/2023 212 (H)   70 - 99 mg/dL Final   Glucose reference range applies only to samples taken after fasting for at least 8 hours.   Glucose-Capillary 05/20/2023 220 (H)  70 - 99 mg/dL Final   Glucose reference range applies only to samples taken after fasting for at least 8 hours.   Glucose-Capillary 05/21/2023 141 (H)  70 - 99 mg/dL Final   Glucose reference range applies only to samples taken after fasting for at least 8 hours.   Glucose-Capillary 05/21/2023 128 (H)  70 - 99 mg/dL Final   Glucose reference range applies only to samples taken after fasting for at least 8 hours.   Glucose-Capillary 05/21/2023 191 (H)  70 - 99 mg/dL Final   Glucose reference range applies only to samples taken after fasting for at least 8 hours.   Glucose-Capillary 05/21/2023 127 (H)  70 - 99 mg/dL Final   Glucose reference range applies only to samples taken after fasting for at least 8 hours.   Glucose-Capillary 05/21/2023 149 (  H)  70 - 99 mg/dL Final   Glucose reference range applies only to samples taken after fasting for at least 8 hours.   Glucose-Capillary 05/22/2023 194 (H)  70 - 99 mg/dL Final   Glucose reference range applies only to samples taken after fasting for at least 8 hours.   Glucose-Capillary 05/22/2023 153 (H)  70 - 99 mg/dL Final   Glucose reference range applies only to samples taken after fasting for at least 8 hours.   Glucose-Capillary 05/22/2023 79  70 - 99 mg/dL Final   Glucose reference range applies only to samples taken after fasting for at least 8 hours.   Glucose-Capillary 05/22/2023 178 (H)  70 - 99 mg/dL Final   Glucose reference range applies only to samples taken after fasting for at least 8 hours.   Hgb A1c MFr Bld 05/23/2023 8.3 (H)  4.8 - 5.6 % Final   Comment: (NOTE) Pre diabetes:          5.7%-6.4%  Diabetes:              >6.4%  Glycemic control for   <7.0% adults with diabetes    Mean Plasma Glucose 05/23/2023 191.51  mg/dL Final   Performed at [facility], [address], [place], [place] [address]   Glucose-Capillary 05/22/2023 157 (H)  70 - 99 mg/dL Final   Glucose reference range applies only to samples taken after fasting for at least 8 hours.   Glucose-Capillary 05/23/2023 166 (H)  70 - 99 mg/dL Final   Glucose reference range applies only to samples taken after fasting for at least 8 hours.   Glucose-Capillary 05/23/2023 288 (H)  70 - 99 mg/dL Final   Glucose reference range applies only to samples taken after fasting for at least 8 hours.   Glucose-Capillary 05/23/2023 245 (H)  70 - 99 mg/dL Final   Glucose reference range applies only to samples taken after fasting for at least 8 hours.   Glucose-Capillary 05/23/2023 142 (H)  70 - 99 mg/dL Final   Glucose reference range applies only to samples taken after fasting for at least 8 hours.   Glucose-Capillary 05/23/2023 205 (H)  70 - 99 mg/dL Final   Glucose reference range applies only to samples taken after fasting for at least 8 hours.   Glucose-Capillary 05/24/2023 179 (H)  70 - 99 mg/dL Final   Glucose reference range applies only to samples taken after fasting for at least 8 hours.   Glucose-Capillary 05/24/2023 221 (H)  70 - 99 mg/dL Final   Glucose reference range applies only to samples taken after fasting for at least 8 hours.   Glucose-Capillary 05/24/2023 142 (H)  70 - 99 mg/dL Final   Glucose reference range applies only to samples taken after fasting for at least 8 hours.   Glucose-Capillary 05/24/2023 163 (H)  70 - 99 mg/dL Final   Glucose reference range applies only to samples taken after fasting for at least 8 hours.   Glucose-Capillary 05/25/2023 255 (H)  70 - 99 mg/dL Final   Glucose reference range applies only to samples taken after fasting for at least 8 hours.   Glucose-Capillary 05/25/2023 189 (H)  70 - 99 mg/dL Final   Glucose reference range applies only to samples taken after fasting for at least 8 hours.   Glucose-Capillary 05/25/2023 211 (H)  70 - 99 mg/dL Final   Glucose  reference range applies only to samples taken after fasting for at least 8  hours.   Glucose-Capillary 05/25/2023 189 (H)  70 - 99 mg/dL Final   Glucose reference range applies only to samples taken after fasting for at least 8 hours.   Glucose-Capillary 05/26/2023 137 (H)  70 - 99 mg/dL Final   Glucose reference range applies only to samples taken after fasting for at least 8 hours.   Glucose-Capillary 05/26/2023 195 (H)  70 - 99 mg/dL Final   Glucose reference range applies only to samples taken after fasting for at least 8 hours.   Glucose-Capillary 05/24/2023 146 (H)  70 - 99 mg/dL Final   Glucose reference range applies only to samples taken after fasting for at least 8 hours.   Glucose-Capillary 05/26/2023 174 (H)  70 - 99 mg/dL Final   Glucose reference range applies only to samples taken after fasting for at least 8 hours.   Glucose-Capillary 05/26/2023 149 (H)  70 - 99 mg/dL Final   Glucose reference range applies only to samples taken after fasting for at least 8 hours.   Glucose-Capillary 05/26/2023 155 (H)  70 - 99 mg/dL Final   Glucose reference range applies only to samples taken after fasting for at least 8 hours.   Glucose-Capillary 05/27/2023 155 (H)  70 - 99 mg/dL Final   Glucose reference range applies only to samples taken after fasting for at least 8 hours.   Glucose-Capillary 05/27/2023 109 (H)  70 - 99 mg/dL Final   Glucose reference range applies only to samples taken after fasting for at least 8 hours.   Glucose-Capillary 05/27/2023 147 (H)  70 - 99 mg/dL Final   Glucose reference range applies only to samples taken after fasting for at least 8 hours.   Glucose-Capillary 05/27/2023 133 (H)  70 - 99 mg/dL Final   Glucose reference range applies only to samples taken after fasting for at least 8 hours.   Glucose-Capillary 05/28/2023 151 (H)  70 - 99 mg/dL Final   Glucose reference range applies only to samples taken after fasting for at least 8 hours.    Glucose-Capillary 05/28/2023 123 (H)  70 - 99 mg/dL Final   Glucose reference range applies only to samples taken after fasting for at least 8 hours.  Admission on 05/19/2023, Discharged on 05/19/2023  Component Date Value Ref Range Status   Cholesterol 05/18/2023 172  0 - 200 mg/dL Final   Triglycerides 16/12/9602 192 (H)  <150 mg/dL Final   HDL 54/11/8117 45  >40 mg/dL Final   Total CHOL/HDL Ratio 05/18/2023 3.8  RATIO Final   VLDL 05/18/2023 38  0 - 40 mg/dL Final   LDL Cholesterol 05/18/2023 89  0 - 99 mg/dL Final   Comment:        Total Cholesterol/HDL:CHD Risk Coronary Heart Disease Risk Table                     Men   Women  1/2 Average Risk   3.4   3.3  Average Risk       5.0   4.4  2 X Average Risk   9.6   7.1  3 X Average Risk  23.4   11.0        Use the calculated Patient Ratio above and the CHD Risk Table to determine the patient's CHD Risk.        ATP III CLASSIFICATION (LDL):  <100     mg/dL   Optimal  147-829  mg/dL   Near or Above  Optimal  130-159  mg/dL   Borderline  272-536  mg/dL   High  >644     mg/dL   Very High Performed at [facility], [address], [place], [place] [address]    POC Amphetamine UR 05/19/2023 None Detected  NONE DETECTED (Cut Off Level 1000 ng/mL) Final   POC Secobarbital (BAR) 05/19/2023 None Detected  NONE DETECTED (Cut Off Level 300 ng/mL) Final   POC Buprenorphine (BUP) 05/19/2023 None Detected  NONE DETECTED (Cut Off Level 10 ng/mL) Final   POC Oxazepam (BZO) 05/19/2023 None Detected  NONE DETECTED (Cut Off Level 300 ng/mL) Final   POC Cocaine UR 05/19/2023 Positive (A)  NONE DETECTED (Cut Off Level 300 ng/mL) Final   POC Methamphetamine UR 05/19/2023 None Detected  NONE DETECTED (Cut Off Level 1000 ng/mL) Final   POC Morphine  05/19/2023 None Detected  NONE DETECTED (Cut Off Level 300 ng/mL) Final   POC Methadone UR 05/19/2023 None Detected  NONE DETECTED (Cut Off Level 300 ng/mL) Final   POC Oxycodone  UR  05/19/2023 None Detected  NONE DETECTED (Cut Off Level 100 ng/mL) Final   POC Marijuana UR 05/19/2023 None Detected  NONE DETECTED (Cut Off Level 50 ng/mL) Final   Glucose-Capillary 05/19/2023 176 (H)  70 - 99 mg/dL Final   Glucose reference range applies only to samples taken after fasting for at least 8 hours.   Glucose-Capillary 05/19/2023 180 (H)  70 - 99 mg/dL Final   Glucose reference range applies only to samples taken after fasting for at least 8 hours.   SARS Coronavirus 2 by RT PCR 05/19/2023 NEGATIVE  NEGATIVE Final   Performed at [facility], [address], [place], [place] [address]   SARSCOV2ONAVIRUS 2 AG 05/19/2023 NEGATIVE  NEGATIVE Final   Comment: (NOTE) SARS-CoV-2 antigen NOT DETECTED.   Negative results are presumptive.  Negative results do not preclude SARS-CoV-2 infection and should not be used as the sole basis for treatment or other patient management decisions, including infection  control decisions, particularly in the presence of clinical signs and  symptoms consistent with COVID-19, or in those who have been in contact with the virus.  Negative results must be combined with clinical observations, patient history, and epidemiological information. The expected result is Negative.  Fact Sheet for Patients: https://www.jennings-kim.com/  Fact Sheet for Healthcare Providers: https://alexander-rogers.biz/  This test is not yet approved or cleared by the [place]  FDA and  has been authorized for detection and/or diagnosis of SARS-CoV-2 by FDA under an Emergency Use Authorization (EUA).  This EUA will remain in effect (meaning this test can be used) for the duration of  the COV                          ID-19 declaration under Section 564(b)(1) of the Act, 21 U.S.C. section 360bbb-3(b)(1), unless the authorization is terminated or revoked sooner.     Glucose-Capillary 05/19/2023 251 (H)  70 - 99 mg/dL Final   Glucose  reference range applies only to samples taken after fasting for at least 8 hours.  Admission on 05/18/2023, Discharged on 05/19/2023  Component Date Value Ref Range Status   Sodium 05/18/2023 138  135 - 145 mmol/L Final   Potassium 05/18/2023 4.1  3.5 - 5.1 mmol/L Final   Chloride 05/18/2023 105  98 - 111 mmol/L Final   CO2 05/18/2023 24  22 - 32 mmol/L Final   Glucose, Bld 05/18/2023 260 (H)  70 - 99 mg/dL Final   Glucose reference range applies only to samples taken after fasting for at least 8 hours.   BUN 05/18/2023 13  8 - 23 mg/dL Final   Creatinine, Ser 05/18/2023 1.21  0.61 - 1.24 mg/dL Final   Calcium  05/18/2023 8.9  8.9 - 10.3 mg/dL Final   Total Protein 16/12/9602 6.5  6.5 - 8.1 g/dL Final   Albumin 54/11/8117 3.5  3.5 - 5.0 g/dL Final   AST 14/78/2956 19  15 - 41 U/L Final   ALT 05/18/2023 13  0 - 44 U/L Final   Alkaline Phosphatase 05/18/2023 69  38 - 126 U/L Final   Total Bilirubin 05/18/2023 0.7  0.0 - 1.2 mg/dL Final   GFR, Estimated 05/18/2023 >60  >60 mL/min Final   Comment: (NOTE) Calculated using the CKD-EPI Creatinine Equation (2021)    Anion gap 05/18/2023 9  5 - 15 Final   Performed at [facility], [address] [place], [place] [address]   Alcohol, Ethyl (B) 05/18/2023 <10  <10 mg/dL Final   Comment: (NOTE) Lowest detectable limit for serum alcohol is 10 mg/dL.  For medical purposes only. Performed at [facility], [address], [place], [place] [address]    WBC 05/18/2023 5.9  4.0 - 10.5 K/uL Final   RBC 05/18/2023 4.34  4.22 - 5.81 MIL/uL Final   Hemoglobin 05/18/2023 13.6  13.0 - 17.0 g/dL Final   HCT 69/62/9528 41.5  39.0 - 52.0 % Final   MCV 05/18/2023 95.6  80.0 - 100.0 fL Final   MCH 05/18/2023 31.3  26.0 - 34.0 pg Final   MCHC 05/18/2023 32.8  30.0 - 36.0 g/dL Final   RDW 41/32/4401 13.3  11.5 - 15.5 % Final   Platelets 05/18/2023 455 (H)  150 - 400 K/uL Final   nRBC 05/18/2023 0.0  0.0 - 0.2 % Final   Performed at [facility], [address] [place], [place] [address]   Opiates 05/18/2023 NONE DETECTED  NONE DETECTED Final   Cocaine 05/18/2023 POSITIVE (A)  NONE DETECTED Final   Benzodiazepines 05/18/2023 NONE DETECTED  NONE DETECTED Final   Amphetamines 05/18/2023 NONE DETECTED  NONE DETECTED Final   Tetrahydrocannabinol 05/18/2023 NONE DETECTED  NONE DETECTED Final   Barbiturates 05/18/2023 NONE DETECTED  NONE DETECTED Final   Comment: (NOTE) DRUG SCREEN FOR MEDICAL PURPOSES ONLY.  IF CONFIRMATION IS NEEDED FOR ANY PURPOSE, NOTIFY LAB WITHIN 5 DAYS.  LOWEST DETECTABLE LIMITS FOR URINE DRUG SCREEN Drug Class                     Cutoff (ng/mL) Amphetamine and metabolites    1000 Barbiturate and metabolites    200 Benzodiazepine                 200 Opiates and metabolites        300 Cocaine and metabolites        300 THC                            50 Performed at [facility], [address], [place], [place] [address]    Acetaminophen  (Tylenol ), Serum 05/18/2023 <10 (L)  10 - 30 ug/mL Final   Comment: (NOTE) Therapeutic concentrations vary significantly. A range of 10-30 ug/mL  may be an effective concentration for many patients. However, some  are best treated at concentrations outside  of this range. Acetaminophen  concentrations >150 ug/mL at 4 hours after ingestion  and >50 ug/mL at 12 hours after ingestion are often associated with  toxic reactions.  Performed at [facility], [address] [place], [place] [address]    Salicylate Lvl 05/18/2023 <7.0 (L)  7.0 - 30.0 mg/dL Final   Performed at [facility], [address], [place], [place] [address]   Glucose-Capillary 05/18/2023 223 (H)  70 - 99 mg/dL Final   Glucose reference range applies only to samples taken after fasting for at least 8 hours.   Glucose-Capillary 05/18/2023 213 (H)  70 - 99 mg/dL Final   Glucose reference range applies only to samples taken after fasting for at least 8 hours.  Office Visit on  04/08/2023  Component Date Value Ref Range Status   POC Glucose 04/08/2023 141 (A)  70 - 99 mg/dl Final   QuantiFERON Incubation 04/08/2023 Incubation performed.   Final   QuantiFERON Criteria 04/08/2023 Comment   Final   Comment: QuantiFERON-TB Gold Plus is a qualitative indirect test for M tuberculosis infection (including disease) and is intended for use in conjunction with risk assessment, radiography, and other medical and diagnostic evaluations. The QuantiFERON-TB Gold Plus result is determined by subtracting the Nil value from either TB antigen (Ag) value. The Mitogen tube serves as a control for the test.    QuantiFERON TB1 Ag Value 04/08/2023 0.00  IU/mL Final   QuantiFERON TB2 Ag Value 04/08/2023 0.01  IU/mL Final   QuantiFERON Nil Value 04/08/2023 0.00  IU/mL Final   QuantiFERON Mitogen Value 04/08/2023 >10.00  IU/mL Final   QuantiFERON-TB Gold Plus 04/08/2023 Negative  Negative Final   Comment: No response to M tuberculosis antigens detected. Infection with M tuberculosis is unlikely, but high risk individuals should be considered for additional testing (ATS/IDSA/CDC Clinical Practice Guidelines, 2017). The reference range is an Antigen minus Nil result of <0.35 IU/mL. Chemiluminescence immunoassay methodology   [facility] on 04/02/2023  Component Date Value Ref Range Status   POC Glucose 04/02/2023 343 (A)  70 - 99 mg/dl Final   POC Glucose 28/41/3244 537 (A)  70 - 99 mg/dl Final  There may be more visits with results that are not included.    Allergies: Lisinopril   Medications:  Facility Ordered Medications  Medication   acetaminophen  (TYLENOL ) tablet 650 mg   alum & mag hydroxide-simeth (MAALOX/MYLANTA) 200-200-20 MG/5ML suspension 30 mL   magnesium  hydroxide (MILK OF MAGNESIA) suspension 30 mL   OLANZapine  zydis (ZYPREXA ) disintegrating tablet 5 mg   OLANZapine  (ZYPREXA ) injection 5 mg   OLANZapine  (ZYPREXA ) injection 10 mg   PTA  Medications  Medication Sig   naproxen  (NAPROSYN ) 250 MG tablet Take 1 tablet (250 mg total) by mouth 2 (two) times daily as needed (Pain).   amLODipine  (NORVASC ) 10 MG tablet Take 1 tablet (10 mg total) by mouth daily.   atorvastatin  (LIPITOR) 40 MG tablet Take 1 tablet (40 mg total) by mouth daily.   DULoxetine  (CYMBALTA ) 60 MG capsule Take 1 capsule (60 mg total) by mouth daily.   hydrOXYzine  (ATARAX ) 25 MG tablet Take 1 tablet (25 mg total) by mouth 3 (three) times daily as needed for anxiety.   nicotine  (NICODERM CQ  - DOSED IN MG/24 HOURS) 14 mg/24hr patch Place 1 patch (14 mg total) onto the skin daily.   traZODone  (DESYREL ) 50 MG tablet Take 1 tablet (50 mg total) by mouth at bedtime.   glipiZIDE  (GLUCOTROL ) 10 MG tablet  Take 1 tablet (10 mg total) by mouth daily before breakfast.   metFORMIN  (GLUCOPHAGE ) 1000 MG tablet Take 1 tablet (1,000 mg total) by mouth 2 (two) times daily with a meal.   loperamide  (IMODIUM ) 2 MG capsule Take 1 capsule (2 mg total) by mouth as needed for diarrhea or loose stools.   cyclobenzaprine  (FLEXERIL ) 10 MG tablet Take 1 tablet (10 mg total) by mouth 2 (two) times daily as needed for muscle spasms.   gabapentin  (NEURONTIN ) 300 MG capsule Take 1 capsule (300 mg total) by mouth at bedtime.   Multiple Vitamin (MULTIVITAMIN WITH MINERALS) TABS tablet Take 1 tablet by mouth daily.   metFORMIN  (GLUCOPHAGE ) 1000 MG tablet Take 1 tablet (1,000 mg total) by mouth 2 (two) times daily.   glipiZIDE  (GLUCOTROL ) 10 MG tablet Take 1 tablet (10 mg total) by mouth daily before breakfast.      Medical Decision Making  Observation unit    Recommendations  Based on my evaluation the patient does not appear to have an emergency medical condition.  Dorthea Gauze, NP 07/01/23  4:16 AM

## 2023-06-30 NOTE — ED Notes (Signed)
 This RN attempted to medicated pt. Pt opened eyes, shrugged away from nurse towards wall, and continued to sleep. RR even and nonlabored. No acute distress noted.

## 2023-06-30 NOTE — BH Assessment (Signed)
 Patient was deferred to [facility] for a telepsych assessment. The assigned care coordinator will provide updates regarding the scheduling of the assessment. [facility] care coordinator can be reached at [phone] for further information on the timing of the telepsych evaluation.

## 2023-06-30 NOTE — ED Provider Notes (Addendum)
 Emergency Medicine Observation Re-evaluation Note  David Wyatt is a 62 y.o. male, seen on rounds today.  Pt initially presented to the ED for complaints of Depression Currently, the patient is resting .  Physical Exam  BP (!) 174/90 (BP Location: Left Arm)   Pulse 68   Temp 98.2 F (36.8 C) (Oral)   Resp 16   Ht 5\' 8"  (1.727 m)   Wt 65.8 kg   SpO2 99%   BMI 22.06 kg/m  Physical Exam General: NAD Cardiac: regular rate  Lungs: equal chest rise Psych: calm  ED Course / MDM  EKG:   I have reviewed the labs performed to date as well as medications administered while in observation.  Recent changes in the last 24 hours include arrived in ER with passive SI.  Plan  Current plan is for psych consult.    Mordecai Applebaum, MD 06/30/23 [phone]  Patient has been cleared for discharge by psychiatry.    Mordecai Applebaum, MD 06/30/23 [phone]

## 2023-07-01 ENCOUNTER — Other Ambulatory Visit: Payer: Self-pay

## 2023-07-01 DIAGNOSIS — F1420 Cocaine dependence, uncomplicated: Secondary | ICD-10-CM | POA: Diagnosis not present

## 2023-07-01 DIAGNOSIS — Z5900 Homelessness unspecified: Secondary | ICD-10-CM | POA: Diagnosis not present

## 2023-07-01 DIAGNOSIS — R45851 Suicidal ideations: Secondary | ICD-10-CM

## 2023-07-01 DIAGNOSIS — Z765 Malingerer [conscious simulation]: Secondary | ICD-10-CM | POA: Diagnosis not present

## 2023-07-01 LAB — GLUCOSE, CAPILLARY
Glucose-Capillary: 269 mg/dL — ABNORMAL HIGH (ref 70–99)
Glucose-Capillary: 515 mg/dL (ref 70–99)
Glucose-Capillary: 344 mg/dL — ABNORMAL HIGH (ref 70–99)
Glucose-Capillary: 62 mg/dL — ABNORMAL LOW (ref 70–99)
Glucose-Capillary: 179 mg/dL — ABNORMAL HIGH (ref 70–99)

## 2023-07-01 MED ORDER — METFORMIN HCL 500 MG PO TABS
500.0000 mg | ORAL_TABLET | Freq: Two times a day (BID) | ORAL | Status: DC
  Administered 2023-07-01 – 2023-07-03 (×4): 500 mg via ORAL
  Filled 2023-07-01 (×2): qty 1
  Filled 2023-07-01: qty 28
  Filled 2023-07-01 (×2): qty 1

## 2023-07-01 MED ORDER — AMLODIPINE BESYLATE 5 MG PO TABS
5.0000 mg | ORAL_TABLET | Freq: Every day | ORAL | Status: DC
  Administered 2023-07-01 – 2023-07-03 (×3): 5 mg via ORAL
  Filled 2023-07-01: qty 14
  Filled 2023-07-01 (×3): qty 1

## 2023-07-01 MED ORDER — INSULIN ASPART 100 UNIT/ML IJ SOLN
0.0000 [IU] | Freq: Three times a day (TID) | INTRAMUSCULAR | Status: DC
  Administered 2023-07-01: 15 [IU] via SUBCUTANEOUS
  Administered 2023-07-02: 5 [IU] via SUBCUTANEOUS
  Administered 2023-07-02 (×2): 8 [IU] via SUBCUTANEOUS
  Administered 2023-07-03: 5 [IU] via SUBCUTANEOUS

## 2023-07-01 MED ORDER — GLIPIZIDE 5 MG PO TABS
10.0000 mg | ORAL_TABLET | Freq: Every day | ORAL | Status: DC
  Administered 2023-07-01 – 2023-07-02 (×2): 10 mg via ORAL
  Filled 2023-07-01: qty 28
  Filled 2023-07-01 (×2): qty 2

## 2023-07-01 MED ORDER — TRAZODONE HCL 50 MG PO TABS
50.0000 mg | ORAL_TABLET | Freq: Every evening | ORAL | Status: DC | PRN
  Administered 2023-07-01 – 2023-07-02 (×2): 50 mg via ORAL
  Filled 2023-07-01: qty 14
  Filled 2023-07-01 (×2): qty 1

## 2023-07-01 MED ORDER — INSULIN ASPART 100 UNIT/ML IJ SOLN
0.0000 [IU] | Freq: Three times a day (TID) | INTRAMUSCULAR | Status: DC
Start: 2023-07-02 — End: 2023-07-01

## 2023-07-01 NOTE — ED Notes (Signed)
 Insulin  orders placed for tmr morning to begin at 0800, RN messaged about getting a STAT order d/t pt's elevated BGL, and also him just eating dinner.

## 2023-07-01 NOTE — ED Notes (Signed)
 Pt is currently asleep, resp are even and unlabored. There are no apparent s/sx of distress. No further concerns at this time.

## 2023-07-01 NOTE — ED Notes (Addendum)
 Patient has a CBG of 515 notified RN and provider.

## 2023-07-01 NOTE — ED Notes (Signed)
 Pt sleeping at present, no distress noted.  Monitoring for safety.

## 2023-07-01 NOTE — ED Notes (Signed)
 Patient was noted in bed sweating perfusely. This nurse assessed patient to see how he was doing. The patient stated he did not feel well, being that the patient is a diabetic this nurse checked the patients blood sugar which was 62 patient was given a roast beef sandwich, oreos and juice in an attempt to raise the patients low blood sugar.

## 2023-07-01 NOTE — Discharge Instructions (Addendum)
 Discharge recommendations:   Medications: Patient is to take medications as prescribed. The patient or patient's guardian is to contact a medical professional and/or outpatient provider to address any new side effects that develop. The patient or the patient's guardian should update outpatient providers of any new medications and/or medication changes.    Outpatient Follow up: Please review list of outpatient resources for psychiatry and counseling. Please follow up with your primary care provider for all medical related needs.    Therapy: We recommend that patient participate in individual therapy to address mental health concerns.   Atypical antipsychotics: If you are prescribed an atypical antipsychotic, it is recommended that your height, weight, BMI, blood pressure, fasting lipid panel, and fasting blood sugar be monitored by your outpatient providers.  Safety:   The following safety precautions should be taken:   No sharp objects. This includes scissors, razors, scrapers, and putty knives.   Chemicals should be removed and locked up.   Medications should be removed and locked up.   Weapons should be removed and locked up. This includes firearms, knives and instruments that can be used to cause injury.   The patient should abstain from use of illicit substances/drugs and abuse of any medications.  If symptoms worsen or do not continue to improve or if the patient becomes actively suicidal or homicidal then it is recommended that the patient return to the closest hospital emergency department, the [facility], or call 911 for further evaluation and treatment. National Suicide Prevention Lifeline 1-800-SUICIDE or 437-023-8796.  About 988 988 offers 24/7 access to trained crisis counselors who can help people experiencing mental health-related distress. People can call or text 988 or chat 988lifeline.org for themselves or if they are worried about a  loved one who may need crisis support. Substance Abuse Treatment Programs  Intensive Outpatient Programs [facility]     [address]      [place], [place]                   [phone]       [facility] [address] [place] [address] [place], [place] [phone]  [facility]     (Inpatient and outpatient)     [address]           [phone]    [facility] [phone] (Suboxone and Methadone)  [address]      [place], [place] [address]      [phone]       [address] [place], [place] [phone]  [facility] (Outpatient/Inpatient, [employer])    (insurance only) [phone]             [facility] (Groups & Residential) [place], [place] [phone]     [facility]     [address]     [place], [place]      [phone]       [facility] (for caregivers and family) [phone] [address] [place], [place] [phone]      Residential Treatment Programs [facility]      [address] [place], [place] [address]  [phone]       [facility] [address], [place], [place] [address] [phone]  [facility] [place]        [phone]       [facility] [phone]  [facility] ([facility]  [address]                                         [place], [place]                                                [phone] or [phone]                               [facility] [address] [place], [address] [phone]  [facility]    [address]      [place], [place]     [phone]       [facility] [address] [place], [place] [phone]  [facility]   [address] [place], [place] [address]     [phone]      Admissions: 8am-3pm M-F  [facility] (RTS) [address] [place], [place] [phone]  [facility] Program: Residential Program [phone] Days)   [place], [place]      [phone] or [phone]     [facility]: [facility] [place], [place] (Walk in Hours over the weekend or by referral)  [facility] [address] [employer], [place], [place] [address] [phone]  Crisis Mobile: [facility]:  [phone] (for crisis response 24 hours a day) [facility] Hotline:      (917)679-2467 Outpatient Psychiatry and Counseling  [facility]: Mobile Crisis Management 24 hours:  [phone]  [facility] of the [employer] sliding scale fee and walk in schedule: M-F 8am-12pm/1pm-3pm [address]  [place], [place] [address] [phone]  [facility] [address] [place], [place] [address] [phone]  [facility] ([facility] [employer])- new patient walk-in appointments available Monday - Friday 8am -3pm.          [address] [place], [place] [address] [phone] or crisis line- [phone]  [facility] Health Outpatient Services/ Intensive Outpatient Therapy Program [address] [place], [place] [address] [phone]  [facility]                  Crisis Services      [phone] [address]     [place], [place] [address]                 [facility]   [facility] [phone]. [address] [place], [place] [address]   [employer] [facility]          [address]  [place], [place] [address]       [phone]  [facility] [address] [place], [place] [address] [phone]  [facility]    [address]    [place], [place] [address]     [phone]       Andee Poles, MD     [address]     [place] [address]     [phone]       [facility] [address]  [place] [address]  [facility]     [address] [place], [place]      [phone]       [facility] Eulogio Ditch, MD [address] [place], [place] [address] [phone]  [facility]     [address]     [place], [place] [address]     [phone]       [facility] [address] [place], [place] [address] [phone] Resources for Temporary Residential Assistance/Crisis Centers  DAY CENTERS [facility]) M-F 8am-3pm   [address] [place], [place] [address]   [phone] Services include: laundry, barbering, support groups, case management, phone  & computer access, showers, AA/NA mtgs, mental health/substance abuse nurse, job skills class, disability information, VA assistance, spiritual classes, etc.   HOMELESS SHELTERS  [facility]     [facility]   [address] [place]     [phone]              [employer] (women and children)       [address] [place], [place] [address] [phone] [email or web address] for application and process Application Required  [facility]   [address]    [place] [address]     [phone]                    [facility] [place] [address] [place], [place] [address] [phone] application appt.) Application Required  [facility] (women only)    [address]     [place], [place] [address]     [phone]      Intake starts 6pm daily Need valid ID, SSC, & Police report [employer] [address] [place], [place] [phone] Application Required  [employer] (men only)     [address]      [place], [place]     [phone]       [facility] (Pregnant women only) [address] [place],  [place] [phone]  [facility]      [address]      [place], [place] [address]     [phone]             [facility] [address] [place], [place] [phone] 90 day commitment/SA/Application process  [facility](men only)     [address]     [place], [place]     [phone]       Check-in at [facility] [address] [place], [place] [address] [phone] Men/Women/Women and Children must be there by 7 pm  [facility] [place], [place] [phone]

## 2023-07-01 NOTE — ED Notes (Signed)
 Pt is T2DM, BGL just checked - 269

## 2023-07-01 NOTE — ED Notes (Signed)
 BGL 515, NP Orelia Binet made aware.

## 2023-07-01 NOTE — ED Notes (Signed)
 Pt A&O x 4, presents with SI, no plan noted.  Pt walks with assist of walker., gait steady.  Comfort measures given.  Monitoring for safety.

## 2023-07-01 NOTE — ED Notes (Signed)
 Pt given lunch. Pt denies any pain. No s/sx of distress at this time. No further concerns.

## 2023-07-01 NOTE — Care Management (Addendum)
 OBS Care Management   Writer referred patient to [facility].   1:27pm  Writer left a HIPAA compliant voice mail message.

## 2023-07-01 NOTE — ED Provider Notes (Addendum)
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 only to samples taken after fasting for at least 8 hours.  Admission on 05/19/2023, Discharged on 05/19/2023  Component Date  Value Ref Range Status   Cholesterol 05/18/2023 172  0 - 200 mg/dL Final   Triglycerides 21/30/8657 192 (H)  <150 mg/dL Final   HDL 84/69/6295 45  >40 mg/dL Final   Total CHOL/HDL Ratio 05/18/2023 3.8  RATIO Final   VLDL 05/18/2023 38  0 - 40 mg/dL Final   LDL Cholesterol 05/18/2023 89  0 - 99 mg/dL Final   Comment:        Total Cholesterol/HDL:CHD Risk Coronary Heart Disease Risk Table                     Men   Women  1/2 Average Risk   3.4   3.3  Average Risk       5.0   4.4  2 X Average Risk   9.6   7.1  3 X Average Risk  23.4   11.0        Use the calculated Patient Ratio above and the CHD Risk Table to determine the patient's CHD Risk.        ATP III CLASSIFICATION (LDL):  <100     mg/dL   Optimal   284-132  mg/dL   Near or Above                    Optimal  130-159  mg/dL   Borderline  440-102  mg/dL   High  >725     mg/dL   Very High Performed at [facility], [address] [place], [place] [address]    POC Amphetamine UR 05/19/2023 None Detected  NONE DETECTED (Cut Off Level 1000 ng/mL) Final   POC Secobarbital (BAR) 05/19/2023 None Detected  NONE DETECTED (Cut Off Level 300 ng/mL) Final   POC Buprenorphine (BUP) 05/19/2023 None Detected  NONE DETECTED (Cut Off Level 10 ng/mL) Final   POC Oxazepam (BZO) 05/19/2023 None Detected  NONE DETECTED (Cut Off Level 300 ng/mL) Final   POC Cocaine UR 05/19/2023 Positive (A)  NONE DETECTED (Cut Off Level 300 ng/mL) Final   POC Methamphetamine UR 05/19/2023 None Detected  NONE DETECTED (Cut Off Level 1000 ng/mL) Final   POC Morphine  05/19/2023 None Detected  NONE DETECTED (Cut Off Level 300 ng/mL) Final   POC Methadone UR 05/19/2023 None Detected  NONE DETECTED (Cut Off Level 300 ng/mL) Final   POC Oxycodone  UR 05/19/2023 None Detected  NONE DETECTED (Cut Off Level 100 ng/mL) Final   POC Marijuana UR 05/19/2023 None Detected  NONE DETECTED (Cut Off Level 50 ng/mL) Final   Glucose-Capillary 05/19/2023 176 (H)  70 - 99 mg/dL Final   Glucose reference range applies only to samples taken after fasting for at least 8 hours.   Glucose-Capillary 05/19/2023 180 (H)  70 - 99 mg/dL Final   Glucose reference range applies only to samples taken after fasting for at least 8 hours.   SARS Coronavirus 2 by RT PCR 05/19/2023 NEGATIVE  NEGATIVE Final   Performed at [facility], [address] [place], [place] [address]   SARSCOV2ONAVIRUS 2 AG 05/19/2023 NEGATIVE  NEGATIVE Final   Comment: (NOTE) SARS-CoV-2 antigen NOT DETECTED.   Negative results are presumptive.  Negative results do not preclude SARS-CoV-2 infection and should not be used as the sole basis for treatment or other patient management decisions, including infection  control  decisions, particularly in the presence of clinical signs and  symptoms consistent with COVID-19, or in those who have been in contact with the virus.  Negative results must be combined with clinical observations, patient history, and epidemiological information. The expected result is Negative.  Fact Sheet for Patients: https://www.jennings-kim.com/  Fact Sheet for Healthcare Providers: https://alexander-rogers.biz/  This test is not yet approved or cleared by the [place]  FDA and  has been authorized for detection and/or diagnosis of SARS-CoV-2 by FDA under an Emergency Use Authorization (EUA).  This EUA will remain in effect (meaning this test can be used) for the duration of  the COV                          ID-19 declaration under Section 564(b)(1) of the Act, 21 U.S.C. section 360bbb-3(b)(1), unless the authorization is terminated or revoked sooner.     Glucose-Capillary 05/19/2023 251 (H)  70 - 99 mg/dL Final   Glucose reference range applies only to samples taken after fasting for at least 8 hours.  Admission on 05/18/2023, Discharged on 05/19/2023  Component Date Value Ref Range Status   Sodium 05/18/2023 138  135 - 145 mmol/L Final   Potassium 05/18/2023 4.1  3.5 - 5.1 mmol/L Final   Chloride 05/18/2023 105  98 - 111 mmol/L Final   CO2 05/18/2023 24  22 - 32 mmol/L Final   Glucose, Bld 05/18/2023 260 (H)  70 - 99 mg/dL Final   Glucose reference range applies only to samples taken after fasting for at least 8 hours.   BUN 05/18/2023 13  8 - 23 mg/dL Final   Creatinine, Ser 05/18/2023 1.21  0.61 - 1.24 mg/dL Final   Calcium  05/18/2023 8.9  8.9 - 10.3 mg/dL Final   Total Protein 16/12/9602 6.5  6.5 - 8.1 g/dL Final   Albumin 54/11/8117 3.5  3.5 - 5.0 g/dL Final   AST 14/78/2956 19  15 - 41 U/L Final   ALT 05/18/2023 13  0 - 44 U/L Final   Alkaline Phosphatase 05/18/2023 69  38 - 126 U/L Final   Total Bilirubin 05/18/2023 0.7  0.0 - 1.2 mg/dL  Final   GFR, Estimated 05/18/2023 >60  >60 mL/min Final   Comment: (NOTE) Calculated using the CKD-EPI Creatinine Equation (2021)    Anion gap 05/18/2023 9  5 - 15 Final   Performed at [facility], [address] [place], [place] [address]   Alcohol, Ethyl (B) 05/18/2023 <10  <10 mg/dL Final   Comment: (NOTE) Lowest detectable limit for serum alcohol is 10 mg/dL.  For medical purposes only. Performed at [facility], [address] [place], [place] [address]    WBC 05/18/2023 5.9  4.0 - 10.5 K/uL Final   RBC 05/18/2023 4.34  4.22 - 5.81 MIL/uL Final   Hemoglobin 05/18/2023 13.6  13.0 - 17.0 g/dL Final   HCT 69/62/9528 41.5  39.0 - 52.0 % Final   MCV 05/18/2023 95.6  80.0 - 100.0 fL Final   MCH 05/18/2023 31.3  26.0 - 34.0 pg Final   MCHC 05/18/2023 32.8  30.0 - 36.0 g/dL Final   RDW 41/32/4401 13.3  11.5 - 15.5 % Final   Platelets 05/18/2023 455 (H)  150 - 400 K/uL Final   nRBC 05/18/2023 0.0  0.0 - 0.2 % Final   Performed at [facility], [address], [place], [place] [address]   Opiates 05/18/2023 NONE DETECTED  NONE DETECTED Final   Cocaine 05/18/2023 POSITIVE (A)  NONE DETECTED Final   Benzodiazepines 05/18/2023 NONE DETECTED  NONE DETECTED Final  Amphetamines 05/18/2023 NONE DETECTED  NONE DETECTED Final   Tetrahydrocannabinol 05/18/2023 NONE DETECTED  NONE DETECTED Final   Barbiturates 05/18/2023 NONE DETECTED  NONE DETECTED Final   Comment: (NOTE) DRUG SCREEN FOR MEDICAL PURPOSES ONLY.  IF CONFIRMATION IS NEEDED FOR ANY PURPOSE, NOTIFY LAB WITHIN 5 DAYS.  LOWEST DETECTABLE LIMITS FOR URINE DRUG SCREEN Drug Class                     Cutoff (ng/mL) Amphetamine and metabolites    1000 Barbiturate and metabolites    200 Benzodiazepine                 200 Opiates and metabolites        300 Cocaine and metabolites        300 THC                            50 Performed at [facility], [address] [place], [place] [address]     Acetaminophen  (Tylenol ), Serum 05/18/2023 <10 (L)  10 - 30 ug/mL Final   Comment: (NOTE) Therapeutic concentrations vary significantly. A range of 10-30 ug/mL  may be an effective concentration for many patients. However, some  are best treated at concentrations outside of this range. Acetaminophen  concentrations >150 ug/mL at 4 hours after ingestion  and >50 ug/mL at 12 hours after ingestion are often associated with  toxic reactions.  Performed at [facility], [address], [place], [place] [address]    Salicylate Lvl 05/18/2023 <7.0 (L)  7.0 - 30.0 mg/dL Final   Performed at [facility], [address], [place], [place] [address]   Glucose-Capillary 05/18/2023 223 (H)  70 - 99 mg/dL Final   Glucose reference range applies only to samples taken after fasting for at least 8 hours.   Glucose-Capillary 05/18/2023 213 (H)  70 - 99 mg/dL Final   Glucose reference range applies only to samples taken after fasting for at least 8 hours.  Office Visit on 04/08/2023  Component Date Value Ref Range Status   POC Glucose 04/08/2023 141 (A)  70 - 99 mg/dl Final   QuantiFERON Incubation 04/08/2023 Incubation performed.   Final   QuantiFERON Criteria 04/08/2023 Comment   Final   Comment: QuantiFERON-TB Gold Plus is a qualitative indirect test for M tuberculosis infection (including disease) and is intended for use in conjunction with risk assessment, radiography, and other medical and diagnostic evaluations. The QuantiFERON-TB Gold Plus result is determined by subtracting the Nil value from either TB antigen (Ag) value. The Mitogen tube serves as a control for the test.    QuantiFERON TB1 Ag Value 04/08/2023 0.00  IU/mL Final   QuantiFERON TB2 Ag Value 04/08/2023 0.01  IU/mL Final   QuantiFERON Nil Value 04/08/2023 0.00  IU/mL Final   QuantiFERON Mitogen Value 04/08/2023 >10.00  IU/mL Final   QuantiFERON-TB Gold Plus 04/08/2023 Negative  Negative Final   Comment: No response to M  tuberculosis antigens detected. Infection with M tuberculosis is unlikely, but high risk individuals should be considered for additional testing (ATS/IDSA/CDC Clinical Practice Guidelines, 2017). The reference range is an Antigen minus Nil result of <0.35 IU/mL. Chemiluminescence immunoassay methodology   [facility] on 04/02/2023  Component Date Value Ref Range Status   POC Glucose 04/02/2023 343 (A)  70 - 99 mg/dl Final   POC Glucose 91/47/8295 537 (A)  70 - 99 mg/dl  Final  There may be more visits with results that are not included.    Blood Alcohol level:  Lab Results  Component Value Date   ETH <10 06/29/2023   ETH <10 05/19/2023    Metabolic Disorder Labs: Lab Results  Component Value Date   HGBA1C 8.3 (H) 05/23/2023   MPG 191.51 05/23/2023   MPG 309.18 12/02/2022   Lab Results  Component Value Date   PROLACTIN 6.9 06/19/2022   Lab Results  Component Value Date   CHOL 172 05/18/2023   TRIG 192 (H) 05/18/2023   HDL 45 05/18/2023   CHOLHDL 3.8 05/18/2023   VLDL 38 05/18/2023   LDLCALC 89 05/18/2023   LDLCALC 122 (H) 05/23/2021    Therapeutic Lab Levels: No results found for: "LITHIUM" No results found for: "VALPROATE" No results found for: "CBMZ"  Physical Findings   AUDIT    Flowsheet Row Admission (Discharged) from 05/19/2023 in [facility]  Alcohol Use Disorder Identification Test Final Score (AUDIT) 0      GAD-7    Flowsheet Row Office Visit from 09/17/2022 in [facility] [place]. [facility] Office Visit from 07/25/2022 in [facility] [place] - A Dept Of [place]. [facility] Office Visit from 07/03/2022 in [facility] Office Visit from 10/08/2021 in [facility] Of [place]. [facility] Office Visit from 09/05/2021 in [facility] CLINIC 1  Total GAD-7 Score 0 14 21 20 11       PHQ2-9    Flowsheet Row  Office Visit from 09/17/2022 in [facility] [place] [facility] Dept Of [place]. [facility] Office Visit from 07/25/2022 in [facility] [place] [facility] Dept Of [place] [facility]. [facility] Office Visit from 07/03/2022 in [facility] CLINIC 1 ED from 03/02/2022 in [facility] ED from 01/21/2022 in [facility]  PHQ-2 Total Score 0 0 5 0 0  PHQ-9 Total Score 0 6 26 0 --      Flowsheet Row ED from 06/30/2023 in [facility] ED from 06[facility]29/2023 in [facility] at [facility] ED from 06/25/2023 in [facility] Department at [facility]  C-SSRS RISK CATEGORY Low Risk High Risk Error: Q3, 4, or 5 should not be populated when Q2 is No        Musculoskeletal  Strength & Muscle Tone: within normal limits Gait & Station: normal Patient leans: N/A  Psychiatric Specialty Exam  Presentation  General Appearance:  Appropriate for Environment; Casual  Eye Contact: Good  Speech: Clear and Coherent; Normal Rate  Speech Volume: Normal  Handedness: Right   Mood and Affect  Mood: Anxious  Affect: Congruent   Thought Process  Thought Processes: Coherent  Descriptions of Associations:Intact  Orientation:Full (Time, Place and Person)  Thought Content:Logical  Diagnosis of Schizophrenia or Schizoaffective disorder in past: No    Hallucinations:Hallucinations: None  Ideas of Reference:None  Suicidal Thoughts:Suicidal Thoughts: No SI Passive Intent and/or Plan: Without Intent; Without Plan  Homicidal Thoughts:Homicidal Thoughts: No   Sensorium  Memory: Immediate Good; Recent Good; Remote Good  Judgment: Fair  Insight: Fair   Executive Functions  Concentration: Good  Attention Span: Good  Recall: Good  Fund of Knowledge: Good  Language: Good   Psychomotor Activity  Psychomotor Activity: Psychomotor  Activity: Normal  Assets  Assets: Manufacturing systems engineer; Desire for Improvement; Leisure Time; Physical Health; Resilience   Sleep  Sleep: Sleep: Fair Number of Hours of Sleep: 7   Nutritional Assessment (For OBS and FBC admissions only) Has the patient had a weight loss or gain of 10 pounds or more in the last 3 months?: No Has the patient had a decrease in food intake/or appetite?: No Does the patient have dental problems?: No Does the patient have eating habits or behaviors that may be indicators of an eating disorder including binging or inducing vomiting?: No Has the patient recently lost weight without trying?: 0 Has the patient been eating poorly because of a decreased appetite?: 0 Malnutrition Screening Tool Score: 0    Physical Exam  Physical Exam Constitutional:      Appearance: Normal appearance.  Eyes:     General:        Right eye: No discharge.        Left eye: No discharge.  Cardiovascular:     Rate and Rhythm: Normal rate.  Pulmonary:     Effort: Pulmonary effort is normal. No respiratory distress.  Musculoskeletal:     Cervical back: Normal range of motion.  Neurological:     Mental Status: He is alert and oriented to person, place, and time.  Psychiatric:        Attention and Perception: Attention and perception normal.        Mood and Affect: Mood is anxious.        Speech: Speech normal.        Behavior: Behavior normal. Behavior is cooperative.        Thought Content: Thought content normal.        Cognition and Memory: Cognition normal.        Judgment: Judgment normal.    Review of Systems  Constitutional:  Negative for chills and fever.  HENT:  Negative for hearing loss.   Respiratory:  Negative for cough.   Cardiovascular:  Negative for chest pain.  Musculoskeletal: Negative.   Psychiatric/Behavioral:  Positive for substance abuse. The patient is nervous/anxious.    Blood pressure (!) 156/98, pulse 64, temperature 98.7 F (37.1 C),  temperature source Oral, resp. rate 18, SpO2 99%. There is no height or weight on file to calculate BMI.  Treatment Plan Summary: Daily contact with patient to assess and evaluate symptoms and progress in treatment and Medication management  Will seek placement at [facility] residential.  Medications:  Restart Amlodipine  5 mg daily, Glucotrol  10 mg daily, metformin  500 mg twice daily  Start - Sliding scale NovoLog -with plans to stabilize blood sugars and wean off as patient does not take insulin  at home. Insulin  would also be a barrier to residential placement.     Costella Dirks, NP 07/01/2023 10:52 AM

## 2023-07-01 NOTE — BH Assessment (Addendum)
 Comprehensive Clinical Assessment (CCA) Note  07/01/2023 David Wyatt [id number]  Disposition: Dorthea Gauze, NP, recommends continuous observation for safety and stabilization with psych reassessment in the AM.   The patient demonstrates the following risk factors for suicide: Chronic risk factors for suicide include: psychiatric disorder of depression, substance use disorder, and medical illness and currently on disability . Acute risk factors for suicide include: social withdrawal/isolation. Protective factors for this patient include: coping skills and hope for the future. Considering these factors, the overall suicide risk at this point appears to be high. Patient is not appropriate for outpatient follow up.  David Wyatt is a 63 year old male presenting as a voluntary walk-in to [facility] due to SI with no plan. Patient has a history of depression and substance abuse. Patient was discharged from [facility] on yesterday with plan to go to [facility]. Patient reports arriving their late and that now he needs mental health treatment for SI. Patient reports "I am tired". Patient reports worsening SI with no plan due to current situation of not getting any help for substance abuse, stating now I need mental health help. Patient is currently homeless. Patient reports having no family support. Patient reports worsening depressive symptoms. Patient reports poor sleep and appetite.  Patient does not have a psychiatrist or therapist. Patient is not taking any psych medications. Patient denied prior psych hospitalizations, suicide attempts and self-harming behaviors.   Patient has been homeless for 2 weeks and reports living with friends on and off. Patient reports he was married and has been separated for 36 years. Patient reports having 3 adult children and haven't spoken to them in "over 50 years". Patient has been on medical disability for approx 15 years. Patient denied access to guns. Patient was anxious and  cooperative during assessment.     Chief Complaint:  Chief Complaint  Patient presents with   Suicidal Thoughts    Visit Diagnosis:  Major Depressive Disorder    CCA Screening, Triage and Referral (STR)  Patient Reported Information How did you hear about us ? Family/Friend  What Is the Reason for Your Visit/Call Today? [name] is a 63 year male presenting to [facility] unaccompanied. Pt reports he was just dischared from from [facility] today and was sent to [facility] but arrived too late. Pt reports that he is having suicidal thoughts at this time. Pt denies any plan or past self harm, and suicide attempts. Pt is looking for a place to stay before he comiits suicidal, he states. Pt denies substance use, Hi and AVH.  How Long Has This Been Causing You Problems? <Week  What Do You Feel Would Help You the Most Today? Medication(s)   Have You Recently Had Any Thoughts About Hurting Yourself? Yes  Are You Planning to Commit Suicide/Harm Yourself At This time? No   Flowsheet Row ED from 06/30/2023 in [facility] ED from 06/29/2023 in [facility] Emergency Department at [facility] ED from 06/25/2023 in [facility] Emergency Department at [facility]  C-SSRS RISK CATEGORY Error: Q3, 4, or 5 should not be populated when Q2 is No High Risk Error: Q3, 4, or 5 should not be populated when Q2 is No       Have you Recently Had Thoughts About Hurting Someone David Wyatt? No  Are You Planning to Harm Someone at This Time? No  Explanation: n/a   Have You Used Any Alcohol or Drugs in the Past 24 Hours? No  How Long Ago Did You  Use Drugs or Alcohol? Last night  What Did You Use and How Much? $30 worth of cocaine   Do You Currently Have a Therapist/Psychiatrist? No  Name of Therapist/Psychiatrist:    Have You Been Recently Discharged From Any Office Practice or Programs? No  Explanation of Discharge From Practice/Program: N/A     CCA Screening Triage  Referral Assessment Type of Contact: Face-to-Face  Telemedicine Service Delivery:   Is this Initial or Reassessment?   Date Telepsych consult ordered in [facility]:    Time Telepsych consult ordered in [facility]:    Location of Assessment: [facility] Assessment Services  Provider Location: [facility] Assessment Services   Collateral Involvement: none   Does Patient Have a Automotive engineer Guardian? No  Legal Guardian Contact Information: n/a  Copy of Legal Guardianship Form: -- (n/a)  Legal Guardian Notified of Arrival: -- (n/a)  Legal Guardian Notified of Pending Discharge: -- (n/a)  If Minor and Not Living with Parent(s), Who has Custody? n/a  Is CPS involved or ever been involved? Never  Is APS involved or ever been involved? Never   Patient Determined To Be At Risk for Harm To Self or Others Based on Review of Patient Reported Information or Presenting Complaint? Yes, for Self-Harm  Method: Plan without intent  Availability of Means: No access or NA  Intent: Vague intent or NA  Notification Required: No need or identified person  Additional Information for Danger to Others Potential: -- (n/a)  Additional Comments for Danger to Others Potential: n/a  Are There Guns or Other Weapons in Your Home? No  Types of Guns/Weapons: n/a  Are These Weapons Safely Secured?                            -- (n/a)  Who Could Verify You Are Able To Have These Secured: n/a  Do You Have any Outstanding Charges, Pending Court Dates, Parole/Probation? none reported  Contacted To Inform of Risk of Harm To Self or Others: Other: Comment    Does Patient Present under Involuntary Commitment? No    [place] of Residence: [place]   Patient Currently Receiving the Following Services: Not Receiving Services   Determination of Need: Urgent (48 hours)   Options For Referral: Inpatient Hospitalization; Intensive Outpatient Therapy; Medication Management; [facility]; Outpatient  Therapy     CCA Biopsychosocial Patient Reported Schizophrenia/Schizoaffective Diagnosis in Past: No   Strengths: Self-awareness   Mental Health Symptoms Depression:  Change in energy/activity; Fatigue; Hopelessness; Increase/decrease in appetite; Sleep (too much or little)   Duration of Depressive symptoms: Duration of Depressive Symptoms: Greater than two weeks   Mania:  None   Anxiety:   Sleep; Tension; Worrying; Restlessness; Fatigue   Psychosis:  None   Duration of Psychotic symptoms:    Trauma:  None   Obsessions:  None   Compulsions:  None   Inattention:  None   Hyperactivity/Impulsivity:  None   Oppositional/Defiant Behaviors:  None   Emotional Irregularity:  None   Other Mood/Personality Symptoms:  None noted    Mental Status Exam Appearance and self-care  Stature:  Average   Weight:  Average weight   Clothing:  -- (Scrubs)   Grooming:  Normal   Cosmetic use:  None   Posture/gait:  Normal   Motor activity:  Not Remarkable   Sensorium  Attention:  Normal   Concentration:  Normal   Orientation:  X5   Recall/memory:  Normal  Affect and Mood  Affect:  Appropriate   Mood:  Depressed   Relating  Eye contact:  Normal   Facial expression:  Responsive   Attitude toward examiner:  Cooperative   Thought and Language  Speech flow: Normal   Thought content:  Appropriate to Mood and Circumstances   Preoccupation:  None   Hallucinations:  None   Organization:  Coherent   [employer] of Knowledge:  Average   Intelligence:  Average   Abstraction:  Normal   Judgement:  Fair   Dance movement psychotherapist:  Adequate   Insight:  Fair   Decision Making:  Normal   Social Functioning  Social Maturity:  Isolates   Social Judgement:  "Chief of Staff"; Normal   Stress  Stressors:  Housing; Office manager Ability:  Normal   Skill Deficits:  None   Supports:  Support needed      Religion: Religion/Spirituality Are You A Religious Person?: No How Might This Affect Treatment?: NA  Leisure/Recreation: Leisure / Recreation Do You Have Hobbies?: Yes  Exercise/Diet: Exercise/Diet Do You Exercise?: No Have You Gained or Lost A Significant Amount of Weight in the Past Six Months?: No Do You Follow a Special Diet?: No Do You Have Any Trouble Sleeping?: Yes Explanation of Sleeping Difficulties: poor   CCA Employment/Education Employment/Work Situation: Employment / Work Situation Employment Situation: On disability Why is Patient on Disability: medical reasons How Long has Patient Been on Disability: 15 years Patient's Job has Been Impacted by Current Illness:  (n/a) Has Patient ever Been in the [employer]?: No  Education: Education Is Patient Currently Attending School?: No Last Grade Completed: 12 Did You Attend College?: No Did You Have An Individualized Education Program (IIEP): No Did You Have Any Difficulty At School?: No   CCA Family/Childhood History Family and Relationship History: Family history Marital status: Separated Separated, when?: 36 years What types of issues is patient dealing with in the relationship?: uta Additional relationship information: n/a Does patient have children?: Yes How many children?: 3 How is patient's relationship with their children?: poor  Childhood History:  Childhood History By whom was/is the patient raised?: Both parents Did patient suffer any verbal/emotional/physical/sexual abuse as a child?: No Did patient suffer from severe childhood neglect?: No Has patient ever been sexually abused/assaulted/raped as an adolescent or adult?: No Was the patient ever a victim of a crime or a disaster?: No Witnessed domestic violence?: No Has patient been affected by domestic violence as an adult?: No       CCA Substance Use Alcohol/Drug Use: Alcohol / Drug Use Pain Medications: see MAR Prescriptions:  see MARf Over the Counter: see MAR History of alcohol / drug use?: Yes Longest period of sobriety (when/how long): uta Negative Consequences of Use: Financial, Personal relationships Withdrawal Symptoms: None                         ASAM's:  Six Dimensions of Multidimensional Assessment  Dimension 1:  Acute Intoxication and/or Withdrawal Potential:   Dimension 1:  Description of individual's past and current experiences of substance use and withdrawal: Pt reports daily cocaine use  Dimension 2:  Biomedical Conditions and Complications:   Dimension 2:  Description of patient's biomedical conditions and  complications: Diabetes  Dimension 3:  Emotional, Behavioral, or Cognitive Conditions and Complications:  Dimension 3:  Description of emotional, behavioral, or cognitive conditions and complications: History of MDD  Dimension 4:  Readiness to Change:  Dimension 4:  Description of Readiness to Change criteria: Pt requesting substance abuse treatment  Dimension 5:  Relapse, Continued use, or Continued Problem Potential:  Dimension 5:  Relapse, continued use, or continued problem potential critiera description: Pt has brief episodes of sobriety  Dimension 6:  Recovery/Living Environment:  Dimension 6:  Recovery/Iiving environment criteria description: Homeless  ASAM Severity Score: ASAM's Severity Rating Score: 11  ASAM Recommended Level of Treatment: ASAM Recommended Level of Treatment: Level II Partial Hospitalization Treatment   Substance use Disorder (SUD) Substance Use Disorder (SUD)  Checklist Symptoms of Substance Use: Continued use despite having a persistent/recurrent physical/psychological problem caused/exacerbated by use, Continued use despite persistent or recurrent social, interpersonal problems, caused or exacerbated by use, Large amounts of time spent to obtain, use or recover from the substance(s), Persistent desire or unsuccessful efforts to cut down or control use,  Presence of craving or strong urge to use, Recurrent use that results in a failure to fulfill major role obligations (work, school, home), Repeated use in physically hazardous situations, Social, occupational, recreational activities given up or reduced due to use, Substance(s) often taken in larger amounts or over longer times than was intended  Recommendations for Services/Supports/Treatments: Recommendations for Services/Supports/Treatments Recommendations For Services/Supports/Treatments: CD-IOP Intensive Chemical Dependency Program, Facility Based Crisis, Individual Therapy, Medication Management  Disposition Recommendation per psychiatric provider:  Recommends continuous observation for safety and stabilization with psych reassessment in the AM.    DSM5 Diagnoses: Patient Active Problem List   Diagnosis Date Noted   MDD (major depressive disorder), severe (HCC) 05/19/2023   MDD (major depressive disorder), recurrent episode, 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   MDD (major depressive disorder), recurrent severe, 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   Substance induced mood disorder (HCC) 06/18/2022   Homelessness 06/18/2022   Inadequate community resources 06/18/2022   Inability to access community resources due to transportation insecurity 06/18/2022   Cocaine use disorder (HCC) 03/03/2022   Cocaine use disorder, severe, dependence (HCC) 01/21/2022   Passive suicidal ideations 01/20/2022   Acute respiratory failure with hypoxia (HCC) 10/13/2021   History of cocaine use 07/23/2021   Chronic right Wyatt pain 07/23/2021   Renal stones 07/23/2021   Dermoid cyst of skin of back 07/23/2021   Uncontrolled type 2 diabetes mellitus with hyperglycemia (HCC) 03/22/2021   Left kidney mass 03/22/2021   Hydronephrosis of right kidney 02/23/2021   Tobacco abuse 08/19/2018   Hyperlipidemia 04/24/2017   GERD (gastroesophageal reflux disease) 06/13/2015   Diabetic neuropathy (HCC) 05/01/2015   Essential  hypertension, benign 11/01/2013   Lumbar disc herniation 09/13/2013   Constipation 09/13/2013   Chronic back pain 09/13/2013   Felon of finger of left hand with lymphangitis 12/13/2012   Crack cocaine use 07/29/2012   Arthritis 07/29/2012   Weakness generalized 07/29/2012     Referrals to Alternative Service(s): Referred to Alternative Service(s):   Place:   Date:   Time:    Referred to Alternative Service(s):   Place:   Date:   Time:    Referred to Alternative Service(s):   Place:   Date:   Time:    Referred to Alternative Service(s):   Place:   Date:   Time:     Adelfa Adolph, [facility]

## 2023-07-02 DIAGNOSIS — Z765 Malingerer [conscious simulation]: Secondary | ICD-10-CM | POA: Diagnosis not present

## 2023-07-02 LAB — GLUCOSE, CAPILLARY
Glucose-Capillary: 352 mg/dL — ABNORMAL HIGH (ref 70–99)
Glucose-Capillary: 261 mg/dL — ABNORMAL HIGH (ref 70–99)
Glucose-Capillary: 250 mg/dL — ABNORMAL HIGH (ref 70–99)
Glucose-Capillary: 268 mg/dL — ABNORMAL HIGH (ref 70–99)
Glucose-Capillary: 118 mg/dL — ABNORMAL HIGH (ref 70–99)
Glucose-Capillary: 131 mg/dL — ABNORMAL HIGH (ref 70–99)

## 2023-07-02 MED ORDER — GLIPIZIDE 5 MG PO TABS
10.0000 mg | ORAL_TABLET | Freq: Two times a day (BID) | ORAL | Status: DC
  Filled 2023-07-02: qty 56

## 2023-07-02 MED ORDER — INSULIN ASPART 100 UNIT/ML IJ SOLN: 0.0000 [IU] | Freq: Three times a day (TID) | INTRAMUSCULAR | Status: DC

## 2023-07-02 MED ORDER — INSULIN ASPART 100 UNIT/ML IJ SOLN
0.0000 [IU] | Freq: Every day | INTRAMUSCULAR | Status: DC
  Administered 2023-07-02: 2 [IU] via SUBCUTANEOUS

## 2023-07-02 NOTE — ED Notes (Signed)
 Pt sleeping@this  time breathing even and unlabored will continue to monitor for safety

## 2023-07-02 NOTE — ED Provider Notes (Addendum)
 Behavioral Health Progress Note  Date and Time: 07/02/2023 5:01 PM Name: David Wyatt MRN:  [id number]  HPI: David Wyatt, 63 y/o male with a history of substance abuse, homelessness, SI.  Presented to [facility] voluntarily on 06/30/2023 with passive suicidal ideations after presenting to [facility] and being turned away.   Today's Patient assessment: On assessment today, the pt reports that their mood is euthymic, improved since admission, and stable. Denies feeling down, depressed, or sad.  Reports that anxiety symptoms are at manageable level.  Sleep is stable. Appetite is stable.  Concentration is without complaint.  Energy level is adequate. Denies having any suicidal thoughts. Denies having any suicidal intent and plan.  Denies having any HI.  Denies having psychotic symptoms.  Denies having side effects to current psychiatric medications. Adjusting Glipizide  dose from 10 mg daily to 10 mg BID with meals. Continuing other medications as listed on the [facility]. Educated on healthy food choices and verbalizes understanding.  Discussed discharge planning for tomorrow, 07/03/2023 to the [facility] rehab services.     Diagnosis:  Final diagnoses:  Malingering  Suicidal thoughts  Homelessness unspecified  Cocaine use disorder, severe, dependence (HCC)    Total Time spent with patient: 45 minutes  Past Psychiatric History: See H & P Past Medical History: See H & P Family History: See H & P Family Psychiatric  History: See H & P Social History: See H & P  Additional Social History:    Pain Medications: see MAR Prescriptions: see MARf Over the Counter: see MAR History of alcohol / drug use?: Yes Longest period of sobriety (when/how long): uta Negative Consequences of Use: Financial, Personal relationships Withdrawal Symptoms: None    Sleep: Fair  Appetite:  Fair  Current Medications:  Current Facility-Administered Medications  Medication Dose Route Frequency Provider Last Rate  Last Admin   acetaminophen  (TYLENOL ) tablet 650 mg  650 mg Oral Q6H PRN Dorthea Gauze, NP       alum & mag hydroxide-simeth (MAALOX/MYLANTA) 200-200-20 MG/5ML suspension 30 mL  30 mL Oral Q4H PRN Dorthea Gauze, NP       amLODipine  (NORVASC ) tablet 5 mg  5 mg Oral Daily Costella Dirks, NP   5 mg at 07/02/23 9147   glipiZIDE  (GLUCOTROL ) tablet 10 mg  10 mg Oral QAC breakfast Costella Dirks, NP   10 mg at 07/02/23 8295   insulin  aspart (novoLOG ) injection 0-15 Units  0-15 Units Subcutaneous TID WC Coleman, Carolyn H, NP   5 Units at 07/02/23 1154   insulin  aspart (novoLOG ) injection 0-5 Units  0-5 Units Subcutaneous QHS Onuoha, Chinwendu V, NP       magnesium  hydroxide (MILK OF MAGNESIA) suspension 30 mL  30 mL Oral Daily PRN Dorthea Gauze, NP       metFORMIN  (GLUCOPHAGE ) tablet 500 mg  500 mg Oral BID WC Coleman, Carolyn H, NP   500 mg at 07/02/23 6213   OLANZapine  (ZYPREXA ) injection 10 mg  10 mg Intramuscular TID PRN Dorthea Gauze, NP       OLANZapine  (ZYPREXA ) injection 5 mg  5 mg Intramuscular TID PRN Dorthea Gauze, NP       OLANZapine  zydis (ZYPREXA ) disintegrating tablet 5 mg  5 mg Oral TID PRN Dorthea Gauze, NP       traZODone  (DESYREL ) tablet 50 mg  50 mg Oral QHS PRN Dorthea Gauze, NP   50 mg at 07/01/23 2221   Current Outpatient Medications  Medication Sig Dispense Refill   amLODipine  (NORVASC ) 10  MG tablet Take 1 tablet (10 mg total) by mouth daily. 30 tablet 0   atorvastatin  (LIPITOR) 40 MG tablet Take 1 tablet (40 mg total) by mouth daily. 30 tablet 0   DULoxetine  (CYMBALTA ) 60 MG capsule Take 1 capsule (60 mg total) by mouth daily. 30 capsule 0   gabapentin  (NEURONTIN ) 300 MG capsule Take 1 capsule (300 mg total) by mouth at bedtime. 90 capsule 0   glipiZIDE  (GLUCOTROL ) 10 MG tablet Take 1 tablet (10 mg total) by mouth daily before breakfast. 30 tablet 0   metFORMIN  (GLUCOPHAGE ) 1000 MG tablet Take 1 tablet (1,000 mg total) by mouth 2 (two) times daily. 30 tablet 0    Multiple Vitamin (MULTIVITAMIN WITH MINERALS) TABS tablet Take 1 tablet by mouth daily. 30 tablet 0   naproxen  (NAPROSYN ) 250 MG tablet Take 1 tablet (250 mg total) by mouth 2 (two) times daily as needed (Pain). 15 tablet 0   traZODone  (DESYREL ) 50 MG tablet Take 1 tablet (50 mg total) by mouth at bedtime. 30 tablet 0   cyclobenzaprine  (FLEXERIL ) 10 MG tablet Take 1 tablet (10 mg total) by mouth 2 (two) times daily as needed for muscle spasms. (Patient not taking: Reported on 07/01/2023) 20 tablet 0   hydrOXYzine  (ATARAX ) 25 MG tablet Take 1 tablet (25 mg total) by mouth 3 (three) times daily as needed for anxiety. (Patient not taking: Reported on 07/01/2023) 30 tablet 0   loperamide  (IMODIUM ) 2 MG capsule Take 1 capsule (2 mg total) by mouth as needed for diarrhea or loose stools. (Patient not taking: Reported on 07/01/2023) 30 capsule 0   nicotine  (NICODERM CQ  - DOSED IN MG/24 HOURS) 14 mg/24hr patch Place 1 patch (14 mg total) onto the skin daily. (Patient not taking: Reported on 07/01/2023) 28 patch 0    Labs  Lab Results:  Admission on 06/30/2023  Component Date Value Ref Range Status   POC Amphetamine UR 06/30/2023 None Detected  NONE DETECTED (Cut Off Level 1000 ng/mL) Final   POC Secobarbital (BAR) 06/30/2023 None Detected  NONE DETECTED (Cut Off Level 300 ng/mL) Final   POC Buprenorphine (BUP) 06/30/2023 None Detected  NONE DETECTED (Cut Off Level 10 ng/mL) Final   POC Oxazepam (BZO) 06/30/2023 None Detected  NONE DETECTED (Cut Off Level 300 ng/mL) Final   POC Cocaine UR 06/30/2023 Positive (A)  NONE DETECTED (Cut Off Level 300 ng/mL) Final   POC Methamphetamine UR 06/30/2023 None Detected  NONE DETECTED (Cut Off Level 1000 ng/mL) Final   POC Morphine  06/30/2023 None Detected  NONE DETECTED (Cut Off Level 300 ng/mL) Final   POC Methadone UR 06/30/2023 None Detected  NONE DETECTED (Cut Off Level 300 ng/mL) Final   POC Oxycodone  UR 06/30/2023 None Detected  NONE DETECTED (Cut Off Level 100  ng/mL) Final   POC Marijuana UR 06/30/2023 None Detected  NONE DETECTED (Cut Off Level 50 ng/mL) Final   Glucose-Capillary 07/01/2023 269 (H)  70 - 99 mg/dL Final   Glucose reference range applies only to samples taken after fasting for at least 8 hours.   Glucose-Capillary 07/01/2023 515 (HH)  70 - 99 mg/dL Final   Glucose reference range applies only to samples taken after fasting for at least 8 hours.   Comment 1 07/01/2023 Notify RN   Final   Glucose-Capillary 07/01/2023 344 (H)  70 - 99 mg/dL Final   Glucose reference range applies only to samples taken after fasting for at least 8 hours.   Glucose-Capillary 07/01/2023 62 (L)  70 -  99 mg/dL Final   Glucose reference range applies only to samples taken after fasting for at least 8 hours.   Glucose-Capillary 07/01/2023 179 (H)  70 - 99 mg/dL Final   Glucose reference range applies only to samples taken after fasting for at least 8 hours.   Glucose-Capillary 07/02/2023 352 (H)  70 - 99 mg/dL Final   Glucose reference range applies only to samples taken after fasting for at least 8 hours.   Glucose-Capillary 07/02/2023 261 (H)  70 - 99 mg/dL Final   Glucose reference range applies only to samples taken after fasting for at least 8 hours.   Glucose-Capillary 07/02/2023 250 (H)  70 - 99 mg/dL Final   Glucose reference range applies only to samples taken after fasting for at least 8 hours.  Admission on 06/29/2023, Discharged on 06/30/2023  Component Date Value Ref Range Status   Sodium 06/29/2023 139  135 - 145 mmol/L Final   Potassium 06/29/2023 4.2  3.5 - 5.1 mmol/L Final   Chloride 06/29/2023 106  98 - 111 mmol/L Final   CO2 06/29/2023 24  22 - 32 mmol/L Final   Glucose, Bld 06/29/2023 297 (H)  70 - 99 mg/dL Final   Glucose reference range applies only to samples taken after fasting for at least 8 hours.   BUN 06/29/2023 17  8 - 23 mg/dL Final   Creatinine, Ser 06/29/2023 1.39 (H)  0.61 - 1.24 mg/dL Final   Calcium  06/29/2023 8.9  8.9  - 10.3 mg/dL Final   Total Protein 47/82/9562 6.6  6.5 - 8.1 g/dL Final   Albumin 13/10/6576 3.3 (L)  3.5 - 5.0 g/dL Final   AST 46/96/2952 17  15 - 41 U/L Final   ALT 06/29/2023 20  0 - 44 U/L Final   Alkaline Phosphatase 06/29/2023 92  38 - 126 U/L Final   Total Bilirubin 06/29/2023 0.7  0.0 - 1.2 mg/dL Final   GFR, Estimated 06/29/2023 57 (L)  >60 mL/min Final   Comment: (NOTE) Calculated using the CKD-EPI Creatinine Equation (2021)    Anion gap 06/29/2023 9  5 - 15 Final   Performed at [facility], [address] [place], [place] [address]   Alcohol, Ethyl (B) 06/29/2023 <10  <10 mg/dL Final   Comment: (NOTE) For medical purposes only. Performed at [facility], [address] [place], [place] [address]    Opiates 06/29/2023 NONE DETECTED  NONE DETECTED Final   Cocaine 06/29/2023 POSITIVE (A)  NONE DETECTED Final   Benzodiazepines 06/29/2023 NONE DETECTED  NONE DETECTED Final   Amphetamines 06/29/2023 NONE DETECTED  NONE DETECTED Final   Tetrahydrocannabinol 06/29/2023 NONE DETECTED  NONE DETECTED Final   Barbiturates 06/29/2023 NONE DETECTED  NONE DETECTED Final   Comment: (NOTE) DRUG SCREEN FOR MEDICAL PURPOSES ONLY.  IF CONFIRMATION IS NEEDED FOR ANY PURPOSE, NOTIFY LAB WITHIN 5 DAYS.  LOWEST DETECTABLE LIMITS FOR URINE DRUG SCREEN Drug Class                     Cutoff (ng/mL) Amphetamine and metabolites    1000 Barbiturate and metabolites    200 Benzodiazepine                 200 Opiates and metabolites        300 Cocaine and metabolites        300 THC  50 Performed at [facility], [address] [place], [place] [address]    WBC 06/29/2023 6.3  4.0 - 10.5 K/uL Final   RBC 06/29/2023 4.00 (L)  4.22 - 5.81 MIL/uL Final   Hemoglobin 06/29/2023 12.8 (L)  13.0 - 17.0 g/dL Final   HCT 41/32/4401 38.6 (L)  39.0 - 52.0 % Final   MCV 06/29/2023 96.5  80.0 - 100.0 fL Final   MCH 06/29/2023 32.0  26.0 - 34.0 pg Final    MCHC 06/29/2023 33.2  30.0 - 36.0 g/dL Final   RDW 02/72/5366 13.5  11.5 - 15.5 % Final   Platelets 06/29/2023 286  150 - 400 K/uL Final   nRBC 06/29/2023 0.0  0.0 - 0.2 % Final   Neutrophils Relative % 06/29/2023 56  % Final   Neutro Abs 06/29/2023 3.5  1.7 - 7.7 K/uL Final   Lymphocytes Relative 06/29/2023 32  % Final   Lymphs Abs 06/29/2023 2.0  0.7 - 4.0 K/uL Final   Monocytes Relative 06/29/2023 7  % Final   Monocytes Absolute 06/29/2023 0.5  0.1 - 1.0 K/uL Final   Eosinophils Relative 06/29/2023 4  % Final   Eosinophils Absolute 06/29/2023 0.2  0.0 - 0.5 K/uL Final   Basophils Relative 06/29/2023 1  % Final   Basophils Absolute 06/29/2023 0.1  0.0 - 0.1 K/uL Final   Immature Granulocytes 06/29/2023 0  % Final   Abs Immature Granulocytes 06/29/2023 0.02  0.00 - 0.07 K/uL Final   Performed at [facility], [address] [place], [place] [address]   Salicylate Lvl 06/29/2023 <7.0 (L)  7.0 - 30.0 mg/dL Final   Performed at [facility], [address] [name], [place] [address]   Acetaminophen  (Tylenol ), Serum 06/29/2023 <10 (L)  10 - 30 ug/mL Final   Comment: (NOTE) Therapeutic concentrations vary significantly. A range of 10-30 ug/mL  may be an effective concentration for many patients. However, some  are best treated at concentrations outside of this range. Acetaminophen  concentrations >150 ug/mL at 4 hours after ingestion  and >50 ug/mL at 12 hours after ingestion are often associated with  toxic reactions.  Performed at [facility], [address], [place], [place] [address]   Admission on 06/25/2023, Discharged on 06/25/2023  Component Date Value Ref Range Status   Sodium 06/25/2023 139  135 - 145 mmol/L Final   Potassium 06/25/2023 3.9  3.5 - 5.1 mmol/L Final   Chloride 06/25/2023 104  98 - 111 mmol/L Final   CO2 06/25/2023 25  22 - 32 mmol/L Final   Glucose, Bld 06/25/2023 270 (H)  70 - 99 mg/dL Final   Glucose reference range applies only to  samples taken after fasting for at least 8 hours.   BUN 06/25/2023 12  8 - 23 mg/dL Final   Creatinine, Ser 06/25/2023 1.00  0.61 - 1.24 mg/dL Final   Calcium  06/25/2023 8.9  8.9 - 10.3 mg/dL Final   GFR, Estimated 06/25/2023 >60  >60 mL/min Final   Comment: (NOTE) Calculated using the CKD-EPI Creatinine Equation (2021)    Anion gap 06/25/2023 10  5 - 15 Final   Performed at [facility], [address] [place], [place] [address]   WBC 06/25/2023 7.6  4.0 - 10.5 K/uL Final   RBC 06/25/2023 4.15 (L)  4.22 - 5.81 MIL/uL Final   Hemoglobin 06/25/2023 13.0  13.0 - 17.0 g/dL Final   HCT 29/51/8841 39.8  39.0 -  52.0 % Final   MCV 06/25/2023 95.9  80.0 - 100.0 fL Final   MCH 06/25/2023 31.3  26.0 - 34.0 pg Final   MCHC 06/25/2023 32.7  30.0 - 36.0 g/dL Final   RDW 13/10/6576 13.2  11.5 - 15.5 % Final   Platelets 06/25/2023 314  150 - 400 K/uL Final   nRBC 06/25/2023 0.0  0.0 - 0.2 % Final   Performed at [facility], [address] [place], [place] [address]   Color, Urine 06/25/2023 STRAW (A)  YELLOW Final   APPearance 06/25/2023 CLEAR  CLEAR Final   Specific Gravity, Urine 06/25/2023 1.016  1.005 - 1.030 Final   pH 06/25/2023 6.0  5.0 - 8.0 Final   Glucose, UA 06/25/2023 >=500 (A)  NEGATIVE mg/dL Final   Hgb urine dipstick 06/25/2023 NEGATIVE  NEGATIVE Final   Bilirubin Urine 06/25/2023 NEGATIVE  NEGATIVE Final   Ketones, ur 06/25/2023 NEGATIVE  NEGATIVE mg/dL Final   Protein, ur 95/28/4132 NEGATIVE  NEGATIVE mg/dL Final   Nitrite 44/03/270 NEGATIVE  NEGATIVE Final   Leukocytes,Ua 06/25/2023 NEGATIVE  NEGATIVE Final   RBC / HPF 06/25/2023 0-5  0 - 5 RBC/hpf Final   WBC, UA 06/25/2023 0-5  0 - 5 WBC/hpf Final   Bacteria, UA 06/25/2023 NONE SEEN  NONE SEEN Final   Squamous Epithelial / HPF 06/25/2023 0-5  0 - 5 /HPF Final   Mucus 06/25/2023 PRESENT   Final   Performed at [facility], [address], [place], [place] [address]   Glucose-Capillary 06/25/2023 248 (H)   70 - 99 mg/dL Final   Glucose reference range applies only to samples taken after fasting for at least 8 hours.   Glucose-Capillary 06/25/2023 329 (H)  70 - 99 mg/dL Final   Glucose reference range applies only to samples taken after fasting for at least 8 hours.   Glucose-Capillary 06/25/2023 274 (H)  70 - 99 mg/dL Final   Glucose reference range applies only to samples taken after fasting for at least 8 hours.  [facility] on 06/09/2023  Component Date Value Ref Range Status   POC Glucose 06/09/2023 332 (A)  70 - 99 mg/dl Final  Admission on 40/34/7425, Discharged on 06/08/2023  Component Date Value Ref Range Status   Glucose-Capillary 06/08/2023 223 (H)  70 - 99 mg/dL Final   Glucose reference range applies only to samples taken after fasting for at least 8 hours.   WBC 06/08/2023 9.6  4.0 - 10.5 K/uL Final   RBC 06/08/2023 4.21 (L)  4.22 - 5.81 MIL/uL Final   Hemoglobin 06/08/2023 13.2  13.0 - 17.0 g/dL Final   HCT 95/63/8756 40.5  39.0 - 52.0 % Final   MCV 06/08/2023 96.2  80.0 - 100.0 fL Final   MCH 06/08/2023 31.4  26.0 - 34.0 pg Final   MCHC 06/08/2023 32.6  30.0 - 36.0 g/dL Final   RDW 43/32/9518 13.7  11.5 - 15.5 % Final   Platelets 06/08/2023 403 (H)  150 - 400 K/uL Final   nRBC 06/08/2023 0.0  0.0 - 0.2 % Final   Neutrophils Relative % 06/08/2023 67  % Final   Neutro Abs 06/08/2023 6.4  1.7 - 7.7 K/uL Final   Lymphocytes Relative 06/08/2023 24  % Final   Lymphs Abs 06/08/2023 2.3  0.7 - 4.0 K/uL Final   Monocytes Relative 06/08/2023 6  % Final   Monocytes Absolute 06/08/2023 0.6  0.1 - 1.0 K/uL Final   Eosinophils Relative  06/08/2023 2  % Final   Eosinophils Absolute 06/08/2023 0.2  0.0 - 0.5 K/uL Final   Basophils Relative 06/08/2023 1  % Final   Basophils Absolute 06/08/2023 0.1  0.0 - 0.1 K/uL Final   Immature Granulocytes 06/08/2023 0  % Final   Abs Immature Granulocytes 06/08/2023 0.04  0.00 - 0.07 K/uL Final   Performed at [facility], [address], [place], [place] [address]   Sodium 06/08/2023 140  135 - 145 mmol/L Final   Potassium 06/08/2023 4.5  3.5 - 5.1 mmol/L Final   Chloride 06/08/2023 104  98 - 111 mmol/L Final   CO2 06/08/2023 23  22 - 32 mmol/L Final   Glucose, Bld 06/08/2023 223 (H)  70 - 99 mg/dL Final   Glucose reference range applies only to samples taken after fasting for at least 8 hours.   BUN 06/08/2023 20  8 - 23 mg/dL Final   Creatinine, Ser 06/08/2023 1.34 (H)  0.61 - 1.24 mg/dL Final   Calcium  06/08/2023 9.3  8.9 - 10.3 mg/dL Final   Total Protein 78/29/5621 7.2  6.5 - 8.1 g/dL Final   Albumin 30/86/5784 3.3 (L)  3.5 - 5.0 g/dL Final   AST 69/62/9528 20  15 - 41 U/L Final   ALT 06/08/2023 19  0 - 44 U/L Final   Alkaline Phosphatase 06/08/2023 85  38 - 126 U/L Final   Total Bilirubin 06/08/2023 0.6  0.0 - 1.2 mg/dL Final   GFR, Estimated 06/08/2023 60 (L)  >60 mL/min Final   Comment: (NOTE) Calculated using the CKD-EPI Creatinine Equation (2021)    Anion gap 06/08/2023 13  5 - 15 Final   Performed at [facility], [address], [place], [place] [address]   Glucose-Capillary 06/08/2023 206 (H)  70 - 99 mg/dL Final   Glucose reference range applies only to samples taken after fasting for at least 8 hours.  Admission on 05/19/2023, Discharged on 05/28/2023  Component Date Value Ref Range Status   Alcohol, Ethyl (B) 05/19/2023 <10  <10 mg/dL Final   Comment: (NOTE) Lowest detectable limit for serum alcohol is 10 mg/dL.  For medical purposes only. Performed at [facility], [address] [place], [place] [address]    Glucose-Capillary 05/19/2023 420 (H)  70 - 99 mg/dL Final   Glucose reference range applies only to samples taken after fasting for at least 8 hours.   Glucose, Bld 05/19/2023 124 (H)  70 - 99 mg/dL Final   Comment: Glucose reference range applies only to samples taken after fasting for at least 8 hours. Performed at [facility], [address] [place], [place] [address]    Glucose-Capillary 05/19/2023 86  70 - 99 mg/dL Final   Glucose reference range applies only to samples taken after fasting for at least 8 hours.   Glucose-Capillary 05/19/2023 282 (H)  70 - 99 mg/dL Final   Glucose reference range applies only to samples taken after fasting for at least 8 hours.   Glucose-Capillary 05/19/2023 141 (H)  70 - 99 mg/dL Final   Glucose reference range applies only to samples taken after fasting for at least 8 hours.   Glucose-Capillary 05/20/2023 169 (H)  70 - 99 mg/dL Final   Glucose reference range applies only to samples taken after fasting for at least 8 hours.   Glucose-Capillary 05/20/2023 66 (L)  70 - 99 mg/dL Final   Glucose reference range applies only to samples taken  after fasting for at least 8 hours.   Glucose-Capillary 05/20/2023 197 (H)  70 - 99 mg/dL Final   Glucose reference range applies only to samples taken after fasting for at least 8 hours.   Glucose-Capillary 05/20/2023 438 (H)  70 - 99 mg/dL Final   Glucose reference range applies only to samples taken after fasting for at least 8 hours.   Glucose-Capillary 05/20/2023 212 (H)  70 - 99 mg/dL Final   Glucose reference range applies only to samples taken after fasting for at least 8 hours.   Glucose-Capillary 05/20/2023 220 (H)  70 - 99 mg/dL Final   Glucose reference range applies only to samples taken after fasting for at least 8 hours.   Glucose-Capillary 05/21/2023 141 (H)  70 - 99 mg/dL Final   Glucose reference range applies only to samples taken after fasting for at least 8 hours.   Glucose-Capillary 05/21/2023 128 (H)  70 - 99 mg/dL Final   Glucose reference range applies only to samples taken after fasting for at least 8 hours.   Glucose-Capillary 05/21/2023 191 (H)  70 - 99 mg/dL Final   Glucose reference range applies only to samples taken after fasting for at least 8 hours.   Glucose-Capillary 05/21/2023 127 (H)  70 - 99 mg/dL Final   Glucose  reference range applies only to samples taken after fasting for at least 8 hours.   Glucose-Capillary 05/21/2023 149 (H)  70 - 99 mg/dL Final   Glucose reference range applies only to samples taken after fasting for at least 8 hours.   Glucose-Capillary 05/22/2023 194 (H)  70 - 99 mg/dL Final   Glucose reference range applies only to samples taken after fasting for at least 8 hours.   Glucose-Capillary 05/22/2023 153 (H)  70 - 99 mg/dL Final   Glucose reference range applies only to samples taken after fasting for at least 8 hours.   Glucose-Capillary 05/22/2023 79  70 - 99 mg/dL Final   Glucose reference range applies only to samples taken after fasting for at least 8 hours.   Glucose-Capillary 05/22/2023 178 (H)  70 - 99 mg/dL Final   Glucose reference range applies only to samples taken after fasting for at least 8 hours.   Hgb A1c MFr Bld 05/23/2023 8.3 (H)  4.8 - 5.6 % Final   Comment: (NOTE) Pre diabetes:          5.7%-6.4%  Diabetes:              >6.4%  Glycemic control for   <7.0% adults with diabetes    Mean Plasma Glucose 05/23/2023 191.51  mg/dL Final   Performed at [facility], [address], [place], [place] [address]   Glucose-Capillary 05/22/2023 157 (H)  70 - 99 mg/dL Final   Glucose reference range applies only to samples taken after fasting for at least 8 hours.   Glucose-Capillary 05/23/2023 166 (H)  70 - 99 mg/dL Final   Glucose reference range applies only to samples taken after fasting for at least 8 hours.   Glucose-Capillary 05/23/2023 288 (H)  70 - 99 mg/dL Final   Glucose reference range applies only to samples taken after fasting for at least 8 hours.   Glucose-Capillary 05/23/2023 245 (H)  70 - 99 mg/dL Final   Glucose reference range applies only to samples taken after fasting for at least 8 hours.   Glucose-Capillary 05/23/2023 142 (H)  70 - 99 mg/dL Final   Glucose reference range  applies only to samples taken after fasting for at least 8 hours.    Glucose-Capillary 05/23/2023 205 (H)  70 - 99 mg/dL Final   Glucose reference range applies only to samples taken after fasting for at least 8 hours.   Glucose-Capillary 05/24/2023 179 (H)  70 - 99 mg/dL Final   Glucose reference range applies only to samples taken after fasting for at least 8 hours.   Glucose-Capillary 05/24/2023 221 (H)  70 - 99 mg/dL Final   Glucose reference range applies only to samples taken after fasting for at least 8 hours.   Glucose-Capillary 05/24/2023 142 (H)  70 - 99 mg/dL Final   Glucose reference range applies only to samples taken after fasting for at least 8 hours.   Glucose-Capillary 05/24/2023 163 (H)  70 - 99 mg/dL Final   Glucose reference range applies only to samples taken after fasting for at least 8 hours.   Glucose-Capillary 05/25/2023 255 (H)  70 - 99 mg/dL Final   Glucose reference range applies only to samples taken after fasting for at least 8 hours.   Glucose-Capillary 05/25/2023 189 (H)  70 - 99 mg/dL Final   Glucose reference range applies only to samples taken after fasting for at least 8 hours.   Glucose-Capillary 05/25/2023 211 (H)  70 - 99 mg/dL Final   Glucose reference range applies only to samples taken after fasting for at least 8 hours.   Glucose-Capillary 05/25/2023 189 (H)  70 - 99 mg/dL Final   Glucose reference range applies only to samples taken after fasting for at least 8 hours.   Glucose-Capillary 05/26/2023 137 (H)  70 - 99 mg/dL Final   Glucose reference range applies only to samples taken after fasting for at least 8 hours.   Glucose-Capillary 05/26/2023 195 (H)  70 - 99 mg/dL Final   Glucose reference range applies only to samples taken after fasting for at least 8 hours.   Glucose-Capillary 05/24/2023 146 (H)  70 - 99 mg/dL Final   Glucose reference range applies only to samples taken after fasting for at least 8 hours.   Glucose-Capillary 05/26/2023 174 (H)  70 - 99 mg/dL Final   Glucose reference range applies only  to samples taken after fasting for at least 8 hours.   Glucose-Capillary 05/26/2023 149 (H)  70 - 99 mg/dL Final   Glucose reference range applies only to samples taken after fasting for at least 8 hours.   Glucose-Capillary 05/26/2023 155 (H)  70 - 99 mg/dL Final   Glucose reference range applies only to samples taken after fasting for at least 8 hours.   Glucose-Capillary 05/27/2023 155 (H)  70 - 99 mg/dL Final   Glucose reference range applies only to samples taken after fasting for at least 8 hours.   Glucose-Capillary 05/27/2023 109 (H)  70 - 99 mg/dL Final   Glucose reference range applies only to samples taken after fasting for at least 8 hours.   Glucose-Capillary 05/27/2023 147 (H)  70 - 99 mg/dL Final   Glucose reference range applies only to samples taken after fasting for at least 8 hours.   Glucose-Capillary 05/27/2023 133 (H)  70 - 99 mg/dL Final   Glucose reference range applies only to samples taken after fasting for at least 8 hours.   Glucose-Capillary 05/28/2023 151 (H)  70 - 99 mg/dL Final   Glucose reference range applies only to samples taken after fasting for at least 8 hours.   Glucose-Capillary 05/28/2023 123 (H)  70 - 99 mg/dL Final   Glucose reference range applies only to samples taken after fasting for at least 8 hours.  Admission on 05/19/2023, Discharged on 05/19/2023  Component Date Value Ref Range Status   Cholesterol 05/18/2023 172  0 - 200 mg/dL Final   Triglycerides 65/78/4696 192 (H)  <150 mg/dL Final   HDL 29/52/8413 45  >40 mg/dL Final   Total CHOL/HDL Ratio 05/18/2023 3.8  RATIO Final   VLDL 05/18/2023 38  0 - 40 mg/dL Final   LDL Cholesterol 05/18/2023 89  0 - 99 mg/dL Final   Comment:        Total Cholesterol/HDL:CHD Risk Coronary Heart Disease Risk Table                     Men   Women  1/2 Average Risk   3.4   3.3  Average Risk       5.0   4.4  2 X Average Risk   9.6   7.1  3 X Average Risk  23.4   11.0        Use the calculated Patient  Ratio above and the CHD Risk Table to determine the patient's CHD Risk.        ATP III CLASSIFICATION (LDL):  <100     mg/dL   Optimal  244-010  mg/dL   Near or Above                    Optimal  130-159  mg/dL   Borderline  272-536  mg/dL   High  >644     mg/dL   Very High Performed at [facility], [address], [place], [place] [address]    POC Amphetamine UR 05/19/2023 None Detected  NONE DETECTED (Cut Off Level 1000 ng/mL) Final   POC Secobarbital (BAR) 05/19/2023 None Detected  NONE DETECTED (Cut Off Level 300 ng/mL) Final   POC Buprenorphine (BUP) 05/19/2023 None Detected  NONE DETECTED (Cut Off Level 10 ng/mL) Final   POC Oxazepam (BZO) 05/19/2023 None Detected  NONE DETECTED (Cut Off Level 300 ng/mL) Final   POC Cocaine UR 05/19/2023 Positive (A)  NONE DETECTED (Cut Off Level 300 ng/mL) Final   POC Methamphetamine UR 05/19/2023 None Detected  NONE DETECTED (Cut Off Level 1000 ng/mL) Final   POC Morphine  05/19/2023 None Detected  NONE DETECTED (Cut Off Level 300 ng/mL) Final   POC Methadone UR 05/19/2023 None Detected  NONE DETECTED (Cut Off Level 300 ng/mL) Final   POC Oxycodone  UR 05/19/2023 None Detected  NONE DETECTED (Cut Off Level 100 ng/mL) Final   POC Marijuana UR 05/19/2023 None Detected  NONE DETECTED (Cut Off Level 50 ng/mL) Final   Glucose-Capillary 05/19/2023 176 (H)  70 - 99 mg/dL Final   Glucose reference range applies only to samples taken after fasting for at least 8 hours.   Glucose-Capillary 05/19/2023 180 (H)  70 - 99 mg/dL Final   Glucose reference range applies only to samples taken after fasting for at least 8 hours.   SARS Coronavirus 2 by RT PCR 05/19/2023 NEGATIVE  NEGATIVE Final   Performed at [facility], [address] [place], [place] [address]   SARSCOV2ONAVIRUS 2 AG 05/19/2023 NEGATIVE  NEGATIVE Final   Comment: (NOTE) SARS-CoV-2 antigen NOT DETECTED.   Negative results are presumptive.  Negative results do not  preclude SARS-CoV-2 infection and should not be used as the sole basis for treatment or other  patient management decisions, including infection  control decisions, particularly in the presence of clinical signs and  symptoms consistent with COVID-19, or in those who have been in contact with the virus.  Negative results must be combined with clinical observations, patient history, and epidemiological information. The expected result is Negative.  Fact Sheet for Patients: https://www.jennings-kim.com/  Fact Sheet for Healthcare Providers: https://alexander-rogers.biz/  This test is not yet approved or cleared by the [place]  FDA and  has been authorized for detection and/or diagnosis of SARS-CoV-2 by FDA under an Emergency Use Authorization (EUA).  This EUA will remain in effect (meaning this test can be used) for the duration of  the COV                          ID-19 declaration under Section 564(b)(1) of the Act, 21 U.S.C. section 360bbb-3(b)(1), unless the authorization is terminated or revoked sooner.     Glucose-Capillary 05/19/2023 251 (H)  70 - 99 mg/dL Final   Glucose reference range applies only to samples taken after fasting for at least 8 hours.  Admission on 05/18/2023, Discharged on 05/19/2023  Component Date Value Ref Range Status   Sodium 05/18/2023 138  135 - 145 mmol/L Final   Potassium 05/18/2023 4.1  3.5 - 5.1 mmol/L Final   Chloride 05/18/2023 105  98 - 111 mmol/L Final   CO2 05/18/2023 24  22 - 32 mmol/L Final   Glucose, Bld 05/18/2023 260 (H)  70 - 99 mg/dL Final   Glucose reference range applies only to samples taken after fasting for at least 8 hours.   BUN 05/18/2023 13  8 - 23 mg/dL Final   Creatinine, Ser 05/18/2023 1.21  0.61 - 1.24 mg/dL Final   Calcium  05/18/2023 8.9  8.9 - 10.3 mg/dL Final   Total Protein 29/52/8413 6.5  6.5 - 8.1 g/dL Final   Albumin 24/40/1027 3.5  3.5 - 5.0 g/dL Final   AST 25/36/6440 19  15 - 41  U/L Final   ALT 05/18/2023 13  0 - 44 U/L Final   Alkaline Phosphatase 05/18/2023 69  38 - 126 U/L Final   Total Bilirubin 05/18/2023 0.7  0.0 - 1.2 mg/dL Final   GFR, Estimated 05/18/2023 >60  >60 mL/min Final   Comment: (NOTE) Calculated using the CKD-EPI Creatinine Equation (2021)    Anion gap 05/18/2023 9  5 - 15 Final   Performed at [facility], [address], [place], [place] [address]   Alcohol, Ethyl (B) 05/18/2023 <10  <10 mg/dL Final   Comment: (NOTE) Lowest detectable limit for serum alcohol is 10 mg/dL.  For medical purposes only. Performed at [facility], [address], [place], [place] [address]    WBC 05/18/2023 5.9  4.0 - 10.5 K/uL Final   RBC 05/18/2023 4.34  4.22 - 5.81 MIL/uL Final   Hemoglobin 05/18/2023 13.6  13.0 - 17.0 g/dL Final   HCT 87/56/4332 41.5  39.0 - 52.0 % Final   MCV 05/18/2023 95.6  80.0 - 100.0 fL Final   MCH 05/18/2023 31.3  26.0 - 34.0 pg Final   MCHC 05/18/2023 32.8  30.0 - 36.0 g/dL Final   RDW 95/18/8416 13.3  11.5 - 15.5 % Final   Platelets 05/18/2023 455 (H)  150 - 400 K/uL Final   nRBC 05/18/2023 0.0  0.0 - 0.2 % Final   Performed at [facility], [address], [place],  [place] [address]   Opiates 05/18/2023 NONE DETECTED  NONE DETECTED Final   Cocaine 05/18/2023 POSITIVE (A)  NONE DETECTED Final   Benzodiazepines 05/18/2023 NONE DETECTED  NONE DETECTED Final   Amphetamines 05/18/2023 NONE DETECTED  NONE DETECTED Final   Tetrahydrocannabinol 05/18/2023 NONE DETECTED  NONE DETECTED Final   Barbiturates 05/18/2023 NONE DETECTED  NONE DETECTED Final   Comment: (NOTE) DRUG SCREEN FOR MEDICAL PURPOSES ONLY.  IF CONFIRMATION IS NEEDED FOR ANY PURPOSE, NOTIFY LAB WITHIN 5 DAYS.  LOWEST DETECTABLE LIMITS FOR URINE DRUG SCREEN Drug Class                     Cutoff (ng/mL) Amphetamine and metabolites    1000 Barbiturate and metabolites    200 Benzodiazepine                 200 Opiates and metabolites         300 Cocaine and metabolites        300 THC                            50 Performed at [facility], [address], [place], [place] [address]    Acetaminophen  (Tylenol ), Serum 05/18/2023 <10 (L)  10 - 30 ug/mL Final   Comment: (NOTE) Therapeutic concentrations vary significantly. A range of 10-30 ug/mL  may be an effective concentration for many patients. However, some  are best treated at concentrations outside of this range. Acetaminophen  concentrations >150 ug/mL at 4 hours after ingestion  and >50 ug/mL at 12 hours after ingestion are often associated with  toxic reactions.  Performed at [facility], [address] [place], [place] [address]    Salicylate Lvl 05/18/2023 <7.0 (L)  7.0 - 30.0 mg/dL Final   Performed at [facility], [address], [place], [place] [address]   Glucose-Capillary 05/18/2023 223 (H)  70 - 99 mg/dL Final   Glucose reference range applies only to samples taken after fasting for at least 8 hours.   Glucose-Capillary 05/18/2023 213 (H)  70 - 99 mg/dL Final   Glucose reference range applies only to samples taken after fasting for at least 8 hours.  Office Visit on 04/08/2023  Component Date Value Ref Range Status   POC Glucose 04/08/2023 141 (A)  70 - 99 mg/dl Final   QuantiFERON Incubation 04/08/2023 Incubation performed.   Final   QuantiFERON Criteria 04/08/2023 Comment   Final   Comment: QuantiFERON-TB Gold Plus is a qualitative indirect test for M tuberculosis infection (including disease) and is intended for use in conjunction with risk assessment, radiography, and other medical and diagnostic evaluations. The QuantiFERON-TB Gold Plus result is determined by subtracting the Nil value from either TB antigen (Ag) value. The Mitogen tube serves as a control for the test.    QuantiFERON TB1 Ag Value 04/08/2023 0.00  IU/mL Final   QuantiFERON TB2 Ag Value 04/08/2023 0.01  IU/mL Final   QuantiFERON Nil Value 04/08/2023 0.00  IU/mL  Final   QuantiFERON Mitogen Value 04/08/2023 >10.00  IU/mL Final   QuantiFERON-TB Gold Plus 04/08/2023 Negative  Negative Final   Comment: No response to M tuberculosis antigens detected. Infection with M tuberculosis is unlikely, but high risk individuals should be considered for additional testing (ATS/IDSA/CDC Clinical Practice Guidelines, 2017). The reference range is an Antigen minus Nil result of <0.35 IU/mL. Chemiluminescence immunoassay Chartered certified accountant Program  on 04/02/2023  Component Date Value Ref Range Status   POC Glucose 04/02/2023 343 (A)  70 - 99 mg/dl Final   POC Glucose 40/98/1191 537 (A)  70 - 99 mg/dl Final  There may be more visits with results that are not included.    Blood Alcohol level:  Lab Results  Component Value Date   ETH <10 06/29/2023   ETH <10 05/19/2023    Metabolic Disorder Labs: Lab Results  Component Value Date   HGBA1C 8.3 (H) 05/23/2023   MPG 191.51 05/23/2023   MPG 309.18 12/02/2022   Lab Results  Component Value Date   PROLACTIN 6.9 06/19/2022   Lab Results  Component Value Date   CHOL 172 05/18/2023   TRIG 192 (H) 05/18/2023   HDL 45 05/18/2023   CHOLHDL 3.8 05/18/2023   VLDL 38 05/18/2023   LDLCALC 89 05/18/2023   LDLCALC 122 (H) 05/23/2021    Therapeutic Lab Levels: No results found for: "LITHIUM" No results found for: "VALPROATE" No results found for: "CBMZ"  Physical Findings   AUDIT    Flowsheet Row Admission (Discharged) from 05/19/2023 in [facility] BEHAVIORAL MEDICINE  Alcohol Use Disorder Identification Test Final Score (AUDIT) 0      GAD-7    Flowsheet Row Office Visit from 09/17/2022 in [facility] [place] - A Dept Of [place] Office Visit from 07/25/2022 in [facility] [place] - A Dept Of [place]. [facility] Office Visit from 07/03/2022 in [facility] Office Visit from 10/08/2021 in [facility] - A  Dept Of  [place]. [facility] Office Visit from 09/05/2021 in [facility] 1  Total GAD-7 Score 0 14 21 20 11       PHQ2-9    Flowsheet Row Office Visit from 09/17/2022 in [facility] [place] - A Dept Of [place]. [facility] Office Visit from 07/25/2022 in [facility] [place] - A Dept Of [place]. [facility] Office Visit from 07/03/2022 in [place] [facility] 1 ED from 03/02/2022 in [facility] ED from 01/21/2022 in [facility]  PHQ-2 Total Score 0 0 5 0 0  PHQ-9 Total Score 0 6 26 0 --      Flowsheet Row ED from 06/30/2023 in [facility] ED from 06/29/2023 in [facility] Emergency Department at [facility] ED from 06/25/2023 in [facility] Emergency Department at [facility]  C-SSRS RISK CATEGORY Low Risk High Risk Error: Q3, 4, or 5 should not be populated when Q2 is No        Musculoskeletal  Strength & Muscle Tone: within normal limits Gait & Station: normal Patient leans: N/A  Psychiatric Specialty Exam  Presentation  General Appearance:  Fairly Groomed  Eye Contact: Fair  Speech: Clear and Coherent  Speech Volume: Normal  Handedness: Right   Mood and Affect  Mood: Depressed; Anxious  Affect: Congruent   Thought Process  Thought Processes: Coherent  Descriptions of Associations:Intact  Orientation:Full (Time, Place and Person)  Thought Content:Logical  Diagnosis of Schizophrenia or Schizoaffective disorder in past: No    Hallucinations:Hallucinations: None  Ideas of Reference:None  Suicidal Thoughts:Suicidal Thoughts: No  Homicidal Thoughts:Homicidal Thoughts: No   Sensorium  Memory: Immediate Fair  Judgment: Fair  Insight: Fair   Art therapist  Concentration: Fair  Attention Span: Fair  Recall: [employer]  of Knowledge: Fair  Language: Fair   Merchandiser, retail Activity: Psychomotor Activity: Normal  Assets  Assets: Communication Skills  Sleep  Sleep: Sleep: Fair  No data recorded  Physical Exam  Physical Exam Constitutional:      Appearance: Normal appearance.  Musculoskeletal:        General: Normal range of motion.     Cervical back: Normal range of motion.  Neurological:     General: No focal deficit present.     Mental Status: He is alert and oriented to person, place, and time.  Psychiatric:        Mood and Affect: Mood normal.        Behavior: Behavior normal.        Thought Content: Thought content normal.        Judgment: Judgment normal.    Review of Systems  Psychiatric/Behavioral:  Positive for depression and substance abuse. Negative for hallucinations, memory loss and suicidal ideas. The patient is nervous/anxious and has insomnia.   All other systems reviewed and are negative.  Blood pressure (!) 141/96, pulse 71, temperature 98.4 F (36.9 C), temperature source Oral, resp. rate 19, SpO2 99%. There is no height or weight on file to calculate BMI.  Treatment Plan Summary: Daily contact with patient to assess and evaluate symptoms and progress in treatment and Medication management Plan is to transfer to [facility] rehab tomorrow. Robet Chiquito, NP 07/02/2023 5:02 PM

## 2023-07-02 NOTE — ED Notes (Signed)
 Pt is currently asleep, resp are even and unlabored. There are no apparent s/sx of distress. No further concerns at this time.

## 2023-07-02 NOTE — ED Notes (Signed)
BGL 250

## 2023-07-02 NOTE — ED Notes (Signed)
 Pt resting in bed, eating dinner. Denies pain or any concerns. No s/sx of distress at this time.

## 2023-07-02 NOTE — ED Notes (Signed)
 Patients current CBG 131

## 2023-07-02 NOTE — ED Notes (Signed)
 Pt is currently sleeping, no distress noted, environmental check complete, will continue to monitor patient for safety.

## 2023-07-02 NOTE — ED Notes (Signed)
 After giving patient his medication and crackers due to him feeling the insulin  will lower his blood sugar, patient got upset and yelled at this nurse, patient walked up to nurses station and punched the glass, security was called to unit.

## 2023-07-02 NOTE — Care Management (Addendum)
 OBS Care Management   Writer referred patient to [facility].  3:34pm  ..[facility]  Per Moira Andrews, patient was accepted to [facility] on Thursday 07-03-2023 at 9am. As long as his blood sugar is below 250.  [address]  [place], [place]  [phone]

## 2023-07-03 DIAGNOSIS — R45851 Suicidal ideations: Secondary | ICD-10-CM | POA: Diagnosis not present

## 2023-07-03 LAB — GLUCOSE, CAPILLARY: Glucose-Capillary: 209 mg/dL — ABNORMAL HIGH (ref 70–99)

## 2023-07-03 MED ORDER — GABAPENTIN 300 MG PO CAPS: 300.0000 mg | ORAL_CAPSULE | Freq: Every day | ORAL | 0 refills | Status: DC

## 2023-07-03 MED ORDER — DULOXETINE HCL 60 MG PO CPEP: 60.0000 mg | ORAL_CAPSULE | Freq: Every day | ORAL | 0 refills | Status: DC

## 2023-07-03 MED ORDER — ADULT MULTIVITAMIN W/MINERALS CH: 1.0000 | ORAL_TABLET | Freq: Every day | ORAL | 0 refills | Status: DC

## 2023-07-03 MED ORDER — METFORMIN HCL 1000 MG PO TABS: 1000.0000 mg | ORAL_TABLET | Freq: Two times a day (BID) | ORAL | 0 refills | Status: DC

## 2023-07-03 MED ORDER — HYDROXYZINE HCL 25 MG PO TABS: 25.0000 mg | ORAL_TABLET | Freq: Three times a day (TID) | ORAL | 0 refills | Status: DC | PRN

## 2023-07-03 MED ORDER — NAPROXEN 250 MG PO TABS: 250.0000 mg | ORAL_TABLET | Freq: Two times a day (BID) | ORAL | 0 refills | Status: DC | PRN

## 2023-07-03 MED ORDER — TRAZODONE HCL 50 MG PO TABS: 50.0000 mg | ORAL_TABLET | Freq: Every day | ORAL | 0 refills | Status: DC

## 2023-07-03 MED ORDER — AMLODIPINE BESYLATE 10 MG PO TABS
10.0000 mg | ORAL_TABLET | Freq: Every day | ORAL | 0 refills | Status: DC
Start: 2023-07-03 — End: 2023-10-01

## 2023-07-03 MED ORDER — GLIPIZIDE 10 MG PO TABS: 10.0000 mg | ORAL_TABLET | Freq: Every day | ORAL | 0 refills | Status: DC

## 2023-07-03 MED ORDER — ATORVASTATIN CALCIUM 40 MG PO TABS: 40.0000 mg | ORAL_TABLET | Freq: Every day | ORAL | 0 refills | Status: DC

## 2023-07-03 NOTE — Progress Notes (Signed)
 Pt is awake, alert and oriented X3. Pt did voice any complaints of pain or discomfort. No signs of acute distress noted. Administered scheduled meds per order. Pt denies current SI/HI/AVH, plan or intent. Staff will monitor for pt's safety.

## 2023-07-03 NOTE — Progress Notes (Signed)
 David Wyatt to be D/C'd to [facility] per NP order. Discussed with the patient and all questions fully answered. An After Visit Summary was printed and given to the patient. Medication samples and scripts were also given to patient. All belongings returned. Patient escorted out and D/C via safe transport. Minus Amel  07/03/2023 9:09 AM

## 2023-07-03 NOTE — ED Provider Notes (Signed)
 FBC/OBS ASAP Discharge Summary  Date and Time: 07/03/2023 10:01 AM  Name: David Wyatt  MRN:  [id number]   Discharge Diagnoses:  Final diagnoses:  Malingering  Suicidal thoughts  Homelessness unspecified  Cocaine use disorder, severe, dependence (HCC)    Subjective: David Wyatt 63 y.o., male patient presented to [facility] as a voluntary walk in a with complaints of suicidal ideations and substance use.  He reported that he was excepted to [facility] but got there too late and was turned away.  Patient has a past psychiatric history of substance abuse and depression.  Medical history is pertinent for chronic back pain, hyperlipidemia, GERD, hypertension and diabetes type 2. David Wyatt, is seen face to face by this provider, consulted with Dr. Docia Freeman; and chart reviewed on 07/03/23.   On evaluation David Wyatt reports " I originally went to a different ED and was supposed to go to [facility] but I got there too late and they would not let me in, they turn me away.  I was also having suicidal ideations when I came in because everything going on and frustration.  They told me that I now have a bed at [facility] again and that is where I am supposed to go today.  I still feel somewhat depressed but not suicidal.  I wish that I could get my clothes washed before I leave but they said that I could not because they are out of soap."  Patient has been compliant with scheduled medications.  Patient denies any physical complaints or acute pain at this time. Discussed plan for patient to be transported to [facility] for substance abuse treatment.  Patient agrees to this plan.  During evaluation David Wyatt is lying in bed, in no acute distress.  He is alert & oriented x 4, calm, cooperative and attentive for this assessment.  His mood is depressed with congruent affect.  He has normal speech, and behavior.  Objectively there is no evidence of psychosis/mania or delusional thinking. Pt does not appear to be  responding to internal or external stimuli.  Patient is able to converse coherently, goal directed thoughts, no distractibility, or pre-occupation.  He currently denies suicidal/self-harm/homicidal ideations, psychosis, and paranoia.  Patient answered assessment questions appropriately.   Stay Summary: 07/02/2023  Robet Chiquito, NP          David Wyatt, 63 y/o male with a history of substance abuse, homelessness, SI. Presented to [facility] voluntarily on 06/30/2023 with passive suicidal ideations after presenting to [facility] and being turned away. On assessment today, the pt reports that their mood is euthymic, improved since admission, and stable. Denies feeling down, depressed, or sad. Reports that anxiety symptoms are at manageable level. Sleep is stable.Appetite is stable. Concentration is without complaint. Energy level is adequate.Denies having any suicidal thoughts. Denies having any suicidal intent and plan. Denies having any HI. Denies having psychotic symptoms. Denies having side effects to current psychiatric medications. Adjusting Glipizide  dose from 10 mg daily to 10 mg BID with meals. Continuing other medications as listed on the [facility]. Educated on healthy food choices and verbalizes understanding.  Discussed discharge planning for tomorrow, 07/03/2023 to the [facility] rehab services.    Total Time spent with patient: 20 minutes  Past Psychiatric History: Substance abuse, depression and homelessness Past Medical History: Chronic back pain, hyperlipidemia, GERD, hypertension and diabetes type 2 Family History: none reported Family Psychiatric History: None reported Social History: Patient is currently homeless and on disability.  He is separated and has  3 children.  Patient reports using crack cocaine. Tobacco Cessation:  A prescription for an FDA-approved tobacco cessation medication provided at discharge  Current Medications:  Current Facility-Administered Medications  Medication Dose Route  Frequency Provider Last Rate Last Admin   acetaminophen  (TYLENOL ) tablet 650 mg  650 mg Oral Q6H PRN Dorthea Gauze, NP       alum & mag hydroxide-simeth (MAALOX/MYLANTA) 200-200-20 MG/5ML suspension 30 mL  30 mL Oral Q4H PRN Dorthea Gauze, NP       amLODipine  (NORVASC ) tablet 5 mg  5 mg Oral Daily Coleman, Carolyn H, NP   5 mg at 07/03/23 0835   glipiZIDE  (GLUCOTROL ) tablet 10 mg  10 mg Oral BID AC Nkwenti, Doris, NP       insulin  aspart (novoLOG ) injection 0-15 Units  0-15 Units Subcutaneous TID WC Coleman, Carolyn H, NP   5 Units at 07/03/23 1610   insulin  aspart (novoLOG ) injection 0-5 Units  0-5 Units Subcutaneous QHS Onuoha, Chinwendu V, NP   2 Units at 07/02/23 2148   magnesium  hydroxide (MILK OF MAGNESIA) suspension 30 mL  30 mL Oral Daily PRN Dorthea Gauze, NP       metFORMIN  (GLUCOPHAGE ) tablet 500 mg  500 mg Oral BID WC Coleman, Carolyn H, NP   500 mg at 07/03/23 0835   OLANZapine  (ZYPREXA ) injection 10 mg  10 mg Intramuscular TID PRN Dorthea Gauze, NP       OLANZapine  (ZYPREXA ) injection 5 mg  5 mg Intramuscular TID PRN Dorthea Gauze, NP       OLANZapine  zydis (ZYPREXA ) disintegrating tablet 5 mg  5 mg Oral TID PRN Dorthea Gauze, NP       traZODone  (DESYREL ) tablet 50 mg  50 mg Oral QHS PRN Dorthea Gauze, NP   50 mg at 07/02/23 2148   Current Outpatient Medications  Medication Sig Dispense Refill   amLODipine  (NORVASC ) 10 MG tablet Take 1 tablet (10 mg total) by mouth daily. 30 tablet 0   atorvastatin  (LIPITOR) 40 MG tablet Take 1 tablet (40 mg total) by mouth daily. 30 tablet 0   cyclobenzaprine  (FLEXERIL ) 10 MG tablet Take 1 tablet (10 mg total) by mouth 2 (two) times daily as needed for muscle spasms. (Patient not taking: Reported on 07/01/2023) 20 tablet 0   DULoxetine  (CYMBALTA ) 60 MG capsule Take 1 capsule (60 mg total) by mouth daily. 30 capsule 0   gabapentin  (NEURONTIN ) 300 MG capsule Take 1 capsule (300 mg total) by mouth at bedtime. 90 capsule 0   glipiZIDE  (GLUCOTROL ) 10 MG  tablet Take 1 tablet (10 mg total) by mouth daily before breakfast. 30 tablet 0   hydrOXYzine  (ATARAX ) 25 MG tablet Take 1 tablet (25 mg total) by mouth 3 (three) times daily as needed for anxiety. 30 tablet 0   metFORMIN  (GLUCOPHAGE ) 1000 MG tablet Take 1 tablet (1,000 mg total) by mouth 2 (two) times daily. 30 tablet 0   Multiple Vitamin (MULTIVITAMIN WITH MINERALS) TABS tablet Take 1 tablet by mouth daily. 30 tablet 0   naproxen  (NAPROSYN ) 250 MG tablet Take 1 tablet (250 mg total) by mouth 2 (two) times daily as needed (Pain). 30 tablet 0   traZODone  (DESYREL ) 50 MG tablet Take 1 tablet (50 mg total) by mouth at bedtime. 30 tablet 0    PTA Medications:  PTA Medications  Medication Sig   cyclobenzaprine  (FLEXERIL ) 10 MG tablet Take 1 tablet (10 mg total) by mouth 2 (two) times daily as needed for muscle spasms. (Patient not taking:  Reported on 07/01/2023)   amLODipine  (NORVASC ) 10 MG tablet Take 1 tablet (10 mg total) by mouth daily.   atorvastatin  (LIPITOR) 40 MG tablet Take 1 tablet (40 mg total) by mouth daily.   DULoxetine  (CYMBALTA ) 60 MG capsule Take 1 capsule (60 mg total) by mouth daily.   hydrOXYzine  (ATARAX ) 25 MG tablet Take 1 tablet (25 mg total) by mouth 3 (three) times daily as needed for anxiety.   traZODone  (DESYREL ) 50 MG tablet Take 1 tablet (50 mg total) by mouth at bedtime.   glipiZIDE  (GLUCOTROL ) 10 MG tablet Take 1 tablet (10 mg total) by mouth daily before breakfast.   metFORMIN  (GLUCOPHAGE ) 1000 MG tablet Take 1 tablet (1,000 mg total) by mouth 2 (two) times daily.   gabapentin  (NEURONTIN ) 300 MG capsule Take 1 capsule (300 mg total) by mouth at bedtime.   Multiple Vitamin (MULTIVITAMIN WITH MINERALS) TABS tablet Take 1 tablet by mouth daily.   naproxen  (NAPROSYN ) 250 MG tablet Take 1 tablet (250 mg total) by mouth 2 (two) times daily as needed (Pain).   Facility Ordered Medications  Medication   acetaminophen  (TYLENOL ) tablet 650 mg   alum & mag hydroxide-simeth  (MAALOX/MYLANTA) 200-200-20 MG/5ML suspension 30 mL   magnesium  hydroxide (MILK OF MAGNESIA) suspension 30 mL   OLANZapine  zydis (ZYPREXA ) disintegrating tablet 5 mg   OLANZapine  (ZYPREXA ) injection 5 mg   OLANZapine  (ZYPREXA ) injection 10 mg   amLODipine  (NORVASC ) tablet 5 mg   metFORMIN  (GLUCOPHAGE ) tablet 500 mg   insulin  aspart (novoLOG ) injection 0-15 Units   traZODone  (DESYREL ) tablet 50 mg   insulin  aspart (novoLOG ) injection 0-5 Units   glipiZIDE  (GLUCOTROL ) tablet 10 mg       09/17/2022    3:23 PM 07/25/2022   11:16 AM 07/03/2022    4:42 PM  Depression screen PHQ 2/9  Decreased Interest 0 0 2  Down, Depressed, Hopeless 0 0 3  PHQ - 2 Score 0 0 5  Altered sleeping 0 2 3  Tired, decreased energy 0 0 3  Change in appetite 0 0 3  Feeling bad or failure about yourself  0 1 3  Trouble concentrating 0 1 3  Moving slowly or fidgety/restless 0 1 3  Suicidal thoughts 0 1 3  PHQ-9 Score 0 6 26  Difficult doing work/chores Not difficult at all  Extremely dIfficult    Flowsheet Row ED from 06/30/2023 in [facility] ED from 06/29/2023 in [facility] Emergency Department at [facility] ED from 06/25/2023 in [facility] Emergency Department at [facility]  C-SSRS RISK CATEGORY Low Risk High Risk Error: Q3, 4, or 5 should not be populated when Q2 is No       Musculoskeletal  Strength & Muscle Tone: within normal limits Gait & Station: normal Patient leans: N/A  Psychiatric Specialty Exam  Presentation  General Appearance:  Disheveled  Eye Contact: Fair  Speech: Clear and Coherent  Speech Volume: Normal  Handedness: Right   Mood and Affect  Mood: Depressed  Affect: Congruent; Depressed; Flat   Thought Process  Thought Processes: Coherent  Descriptions of Associations:Intact  Orientation:Full (Time, Place and Person)  Thought Content:WDL  Diagnosis of Schizophrenia or Schizoaffective disorder in past: No     Hallucinations:Hallucinations: None  Ideas of Reference:None  Suicidal Thoughts:Suicidal Thoughts: Yes, Passive SI Passive Intent and/or Plan: Without Intent; Without Plan  Homicidal Thoughts:Homicidal Thoughts: No   Sensorium  Memory: Recent Fair; Immediate Good  Judgment: Fair  Insight: Fair  Executive Functions  Concentration: Fair  Attention Span: Fair  Recall: [employer] of Knowledge: Fair  Language: Fair   Psychomotor Activity  Psychomotor Activity: Psychomotor Activity: Normal   Assets  Assets: Manufacturing systems engineer; Desire for Improvement; Financial Resources/Insurance; Housing; Physical Health; Resilience; Social Support; Vocational/Educational   Sleep  Sleep: Sleep: Fair Number of Hours of Sleep: 7   No data recorded  Physical Exam  Physical Exam Vitals and nursing note reviewed.  Constitutional:      Appearance: Normal appearance.  HENT:     Head: Normocephalic.     Nose: Nose normal.  Eyes:     Extraocular Movements: Extraocular movements intact.  Cardiovascular:     Rate and Rhythm: Normal rate.  Pulmonary:     Effort: Pulmonary effort is normal.  Musculoskeletal:        General: Normal range of motion.     Cervical back: Normal range of motion.  Neurological:     General: No focal deficit present.     Mental Status: He is alert and oriented to person, place, and time.    Review of Systems  Constitutional: Negative.   HENT: Negative.    Eyes: Negative.   Respiratory: Negative.    Cardiovascular: Negative.   Gastrointestinal: Negative.   Genitourinary: Negative.   Musculoskeletal: Negative.   Neurological: Negative.   Endo/Heme/Allergies: Negative.   Psychiatric/Behavioral:  Positive for depression and suicidal ideas.    Blood pressure (!) 145/97, pulse 61, temperature 97.9 F (36.6 C), temperature source Oral, resp. rate 16, SpO2 98%. There is no height or weight on file to calculate BMI.   Plan Of  Care/Follow-up recommendations:  Patient is discharged from behavioral urgent care and will be receiving treatment for substance abuse at [facility].  Patient is currently voluntary and agrees to this plan.  Patient was provided with a 14-day supply of medications.  Patient was also given paper scripts for medications as requested by [facility] to continue medication regimen.   Disposition: Patient transferred to [facility] via safe transport.   Davia Erps, NP 07/03/2023, 10:01 AM

## 2023-07-03 NOTE — ED Notes (Signed)
 Patients CBG 207

## 2023-07-04 ENCOUNTER — Other Ambulatory Visit: Payer: Self-pay

## 2023-07-04 ENCOUNTER — Other Ambulatory Visit (HOSPITAL_BASED_OUTPATIENT_CLINIC_OR_DEPARTMENT_OTHER): Payer: Self-pay

## 2023-07-05 ENCOUNTER — Encounter (HOSPITAL_BASED_OUTPATIENT_CLINIC_OR_DEPARTMENT_OTHER): Payer: Self-pay | Admitting: Emergency Medicine

## 2023-07-05 ENCOUNTER — Other Ambulatory Visit: Payer: Self-pay

## 2023-07-05 ENCOUNTER — Emergency Department (HOSPITAL_BASED_OUTPATIENT_CLINIC_OR_DEPARTMENT_OTHER)
Admission: EM | Admit: 2023-07-05 | Discharge: 2023-07-05 | Disposition: A | Discharge status: Home / Self Care | Payer: MEDICAID | Attending: Emergency Medicine | Admitting: Emergency Medicine

## 2023-07-05 DIAGNOSIS — E1165 Type 2 diabetes mellitus with hyperglycemia: Secondary | ICD-10-CM | POA: Diagnosis not present

## 2023-07-05 DIAGNOSIS — Z760 Encounter for issue of repeat prescription: Secondary | ICD-10-CM | POA: Diagnosis not present

## 2023-07-05 DIAGNOSIS — Z7984 Long term (current) use of oral hypoglycemic drugs: Secondary | ICD-10-CM | POA: Insufficient documentation

## 2023-07-05 DIAGNOSIS — Z794 Long term (current) use of insulin: Secondary | ICD-10-CM | POA: Insufficient documentation

## 2023-07-05 DIAGNOSIS — R739 Hyperglycemia, unspecified: Secondary | ICD-10-CM | POA: Diagnosis present

## 2023-07-05 LAB — BASIC METABOLIC PANEL WITH GFR
Anion gap: 15 (ref 5–15)
BUN: 13 mg/dL (ref 8–23)
CO2: 22 mmol/L (ref 22–32)
Calcium: 9.2 mg/dL (ref 8.9–10.3)
Chloride: 100 mmol/L (ref 98–111)
Creatinine, Ser: 1.07 mg/dL (ref 0.61–1.24)
GFR, Estimated: >60 mL/min (ref >60)
Glucose, Bld: 311 mg/dL — ABNORMAL HIGH (ref 70–99)
Potassium: 4 mmol/L (ref 3.5–5.1)
Sodium: 137 mmol/L (ref 135–145)

## 2023-07-05 LAB — CBC
HCT: 40.5 % (ref 39.0–52.0)
Hemoglobin: 13.5 g/dL (ref 13.0–17.0)
MCH: 31.2 pg (ref 26.0–34.0)
MCHC: 33.3 g/dL (ref 30.0–36.0)
MCV: 93.5 fL (ref 80.0–100.0)
Platelets: 296 10*3/uL (ref 150–400)
RBC: 4.33 MIL/uL (ref 4.22–5.81)
RDW: 13.2 % (ref 11.5–15.5)
WBC: 6.7 10*3/uL (ref 4.0–10.5)
nRBC: 0 % (ref 0.0–0.2)

## 2023-07-05 LAB — CBG MONITORING, ED
Glucose-Capillary: 310 mg/dL — ABNORMAL HIGH (ref 70–99)
Glucose-Capillary: 188 mg/dL — ABNORMAL HIGH (ref 70–99)

## 2023-07-05 MED ORDER — INSULIN REGULAR HUMAN 100 UNIT/ML IJ SOLN: 5.0000 [IU] | Freq: Once | INTRAMUSCULAR | Status: DC

## 2023-07-05 MED ORDER — TRULICITY 1.5 MG/0.5ML ~~LOC~~ SOAJ
1.5000 mg | SUBCUTANEOUS | 0 refills | Status: DC
  Filled 2023-07-05 – 2023-07-21 (×3): qty 2, 28d supply, fill #0

## 2023-07-05 MED ORDER — NOVOLOG FLEXPEN 100 UNIT/ML ~~LOC~~ SOPN
0.0000 [IU] | PEN_INJECTOR | Freq: Three times a day (TID) | SUBCUTANEOUS | 0 refills | Status: DC
  Filled 2023-07-05 – 2023-07-21 (×2): qty 27, 90d supply, fill #0

## 2023-07-05 MED ORDER — INSULIN ASPART 100 UNIT/ML IJ SOLN
5.0000 [IU] | Freq: Once | INTRAMUSCULAR | Status: AC
  Administered 2023-07-05: 5 [IU] via SUBCUTANEOUS

## 2023-07-05 NOTE — ED Notes (Signed)
 Patient ambulatory to restroom. Steady gait.

## 2023-07-05 NOTE — ED Provider Notes (Signed)
 [place] [facility] AT [facility] Provider Note   CSN: [id number] Arrival date & time: 07/05/23  1622     History  Chief Complaint  Patient presents with   Hyperglycemia    David Wyatt is a 63 y.o. male.  With a history of type 2 diabetes  who presents to the ED for medication refill.  Patient was recently discharged from [facility] behavioral health to [facility] for rehabilitation however there was an oversight and transferring all his previously prescribed medications.  His Trulicity and NovoLog  were not filled and he has been hyperglycemic.  Takes Trulicity 1.5 mg weekly and NovoLog  0 to 9 units sliding scale 3 times daily with meals.  Blood glucose in the 300s today feels well without systemic complaint   Hyperglycemia      Home Medications Prior to Admission medications   Medication Sig Start Date End Date Taking? Authorizing Provider  Dulaglutide (TRULICITY) 1.5 MG/0.5ML SOAJ Inject 1.5 mg into the skin once a week. 07/05/23  Yes Sallyanne Creamer, DO  insulin  aspart (NOVOLOG  FLEXPEN) 100 UNIT/ML FlexPen Inject 0-9 Units into the skin 3 (three) times daily with meals. 07/05/23  Yes Sallyanne Creamer, DO  amLODipine  (NORVASC ) 10 MG tablet Take 1 tablet (10 mg total) by mouth daily. 07/03/23   Brent, Amanda C, NP  atorvastatin  (LIPITOR) 40 MG tablet Take 1 tablet (40 mg total) by mouth daily. 07/03/23   Brent, Amanda C, NP  cyclobenzaprine  (FLEXERIL ) 10 MG tablet Take 1 tablet (10 mg total) by mouth 2 (two) times daily as needed for muscle spasms. Patient not taking: Reported on 07/01/2023 05/28/23   Silas Drivers, MD  DULoxetine  (CYMBALTA ) 60 MG capsule Take 1 capsule (60 mg total) by mouth daily. 07/03/23   Davia Erps, NP  gabapentin  (NEURONTIN ) 300 MG capsule Take 1 capsule (300 mg total) by mouth at bedtime. 07/03/23   Brent, Amanda C, NP  glipiZIDE  (GLUCOTROL ) 10 MG tablet Take 1 tablet (10 mg total) by mouth daily before breakfast. 07/03/23    Davia Erps, NP  hydrOXYzine  (ATARAX ) 25 MG tablet Take 1 tablet (25 mg total) by mouth 3 (three) times daily as needed for anxiety. 07/03/23   Brent, Amanda C, NP  metFORMIN  (GLUCOPHAGE ) 1000 MG tablet Take 1 tablet (1,000 mg total) by mouth 2 (two) times daily. 07/03/23   Brent, Amanda C, NP  Multiple Vitamin (MULTIVITAMIN WITH MINERALS) TABS tablet Take 1 tablet by mouth daily. 07/03/23   Brent, Amanda C, NP  naproxen  (NAPROSYN ) 250 MG tablet Take 1 tablet (250 mg total) by mouth 2 (two) times daily as needed (Pain). 07/03/23   Brent, Amanda C, NP  traZODone  (DESYREL ) 50 MG tablet Take 1 tablet (50 mg total) by mouth at bedtime. 07/03/23   Brent, Amanda C, NP      Allergies    Lisinopril     Review of Systems   Review of Systems  Physical Exam Updated Vital Signs BP (!) 151/102 (BP Location: Left Arm)   Pulse 84   Temp 98.4 F (36.9 C) (Oral)   Resp 18   SpO2 98%  Physical Exam Vitals and nursing note reviewed.  HENT:     Head: Normocephalic and atraumatic.  Eyes:     Pupils: Pupils are equal, round, and reactive to light.  Cardiovascular:     Rate and Rhythm: Normal rate and regular rhythm.  Pulmonary:     Effort: Pulmonary effort is normal.     Breath sounds:  Normal breath sounds.  Abdominal:     Palpations: Abdomen is soft.     Tenderness: There is no abdominal tenderness.  Skin:    General: Skin is warm and dry.  Neurological:     Mental Status: He is alert.  Psychiatric:        Mood and Affect: Mood normal.     ED Results / Procedures / Treatments   Labs (all labs ordered are listed, but only abnormal results are displayed) Labs Reviewed  BASIC METABOLIC PANEL WITH GFR - Abnormal; Notable for the following components:      Result Value   Glucose, Bld 311 (*)    All other components within normal limits  CBG MONITORING, ED - Abnormal; Notable for the following components:   Glucose-Capillary 310 (*)    All other components within normal limits  CBG  MONITORING, ED - Abnormal; Notable for the following components:   Glucose-Capillary 188 (*)    All other components within normal limits  CBC    EKG None  Radiology No results found.  Procedures Procedures    Medications Ordered in ED Medications  insulin  aspart (novoLOG ) injection 5 Units (5 Units Subcutaneous Given 07/05/23 1923)    ED Course/ Medical Decision Making/ A&P Clinical Course as of 07/05/23 2012  Sat Jul 05, 2023  2012 Blood glucose downtrending to 188 after 5 units insulin .  Refills have been called in.  Stable for discharge [MP]    Clinical Course User Index [MP] Sallyanne Creamer, DO                                 Medical Decision Making 63 year old male with history of type 2 diabetes on insulin , Trulicity presenting for medication refill.  Discharged to [facility] for rehab.  Has been without insulin  and Trulicity.  Hyperglycemic care with blood sugar in the 300s.  Low suspicion for DKA/HHS.  Will give 5 units subcutaneous insulin  here and call in refills for him.  Dosing verified the pharmacy.  Amount and/or Complexity of Data Reviewed Labs: ordered.  Risk Prescription drug management.           Final Clinical Impression(s) / ED Diagnoses Final diagnoses:  Hyperglycemia due to diabetes mellitus (HCC)    Rx / DC Orders ED Discharge Orders          Ordered    Dulaglutide (TRULICITY) 1.5 MG/0.5ML SOAJ  Weekly        07/05/23 1920    insulin  aspart (NOVOLOG  FLEXPEN) 100 UNIT/ML FlexPen  3 times daily with meals        07/05/23 1920              Sallyanne Creamer, DO 07/05/23 2012

## 2023-07-05 NOTE — ED Triage Notes (Addendum)
 Pt is at [facility] currently in rehab.  Has not had his trulicity or novalog x 2 weeks as they facility stated they did not see a prescription for this. Facility reported pt's CBG in the 300s and stated he needed eval.  Pt has no complaints and 'feels good"  Pt reports [facility] is to come back and pick him up after visit but states they have "certain times" they won't be able to

## 2023-07-05 NOTE — Discharge Instructions (Addendum)
 You were seen in the emergency department for high blood sugar and a medication refill We have called in a refill of your Trulicity and NovoLog  FlexPen to your pharmacy went over [facility] community pharmacy Dosing is  1.5 mg Trulicity once a week NovoLog  0 to 9 units into the skin 3 times daily based on blood glucose readings, as you were previously administering to yourself Return to the emergency department for uncontrolled blood sugars or any other concerns

## 2023-07-05 NOTE — ED Notes (Signed)
 Patient given water per DO [facility] request

## 2023-07-05 NOTE — ED Notes (Addendum)
 Report given to [facility]. Discharge paperwork reviewed with [facility]ff. Staff states they will provide transport back to facility.

## 2023-07-07 ENCOUNTER — Other Ambulatory Visit: Payer: Self-pay

## 2023-07-07 ENCOUNTER — Emergency Department (HOSPITAL_COMMUNITY)
Admission: EM | Admit: 2023-07-07 | Discharge: 2023-07-07 | Disposition: A | Discharge status: Home / Self Care | Payer: MEDICAID | Attending: Emergency Medicine | Admitting: Emergency Medicine

## 2023-07-07 ENCOUNTER — Encounter (HOSPITAL_COMMUNITY): Payer: Self-pay | Admitting: Emergency Medicine

## 2023-07-07 DIAGNOSIS — Z794 Long term (current) use of insulin: Secondary | ICD-10-CM | POA: Insufficient documentation

## 2023-07-07 DIAGNOSIS — Z79899 Other long term (current) drug therapy: Secondary | ICD-10-CM | POA: Insufficient documentation

## 2023-07-07 DIAGNOSIS — I10 Essential (primary) hypertension: Secondary | ICD-10-CM | POA: Diagnosis not present

## 2023-07-07 DIAGNOSIS — E1165 Type 2 diabetes mellitus with hyperglycemia: Secondary | ICD-10-CM | POA: Diagnosis not present

## 2023-07-07 DIAGNOSIS — Z5900 Homelessness unspecified: Secondary | ICD-10-CM | POA: Insufficient documentation

## 2023-07-07 DIAGNOSIS — Z7984 Long term (current) use of oral hypoglycemic drugs: Secondary | ICD-10-CM | POA: Diagnosis not present

## 2023-07-07 DIAGNOSIS — R739 Hyperglycemia, unspecified: Secondary | ICD-10-CM | POA: Diagnosis present

## 2023-07-07 LAB — CBG MONITORING, ED: Glucose-Capillary: 226 mg/dL — ABNORMAL HIGH (ref 70–99)

## 2023-07-07 MED ORDER — AMLODIPINE BESYLATE 5 MG PO TABS
10.0000 mg | ORAL_TABLET | Freq: Every day | ORAL | Status: DC
  Administered 2023-07-07: 10 mg via ORAL
  Filled 2023-07-07: qty 2

## 2023-07-07 MED ORDER — METFORMIN HCL 500 MG PO TABS
1000.0000 mg | ORAL_TABLET | Freq: Two times a day (BID) | ORAL | Status: DC
Start: 2023-07-07 — End: 2023-07-07
  Administered 2023-07-07: 1000 mg via ORAL
  Filled 2023-07-07: qty 2

## 2023-07-07 MED ORDER — INSULIN ASPART 100 UNIT/ML IJ SOLN
0.0000 [IU] | Freq: Three times a day (TID) | INTRAMUSCULAR | Status: DC
  Filled 2023-07-07: qty 0.09

## 2023-07-07 MED ORDER — GABAPENTIN 300 MG PO CAPS: 300.0000 mg | ORAL_CAPSULE | Freq: Every day | ORAL | Status: DC

## 2023-07-07 MED ORDER — DULOXETINE HCL 30 MG PO CPEP
60.0000 mg | ORAL_CAPSULE | Freq: Every day | ORAL | Status: DC
  Administered 2023-07-07: 60 mg via ORAL
  Filled 2023-07-07: qty 2

## 2023-07-07 MED ORDER — ATORVASTATIN CALCIUM 40 MG PO TABS
40.0000 mg | ORAL_TABLET | Freq: Every day | ORAL | Status: DC
  Administered 2023-07-07: 40 mg via ORAL
  Filled 2023-07-07: qty 1

## 2023-07-07 MED ORDER — INSULIN ASPART 100 UNIT/ML FLEXPEN: 0.0000 [IU] | PEN_INJECTOR | Freq: Three times a day (TID) | SUBCUTANEOUS | Status: DC

## 2023-07-07 NOTE — ED Triage Notes (Signed)
 Patient bib [facility] for hyperglycemia blood sugar was slightly up at [facility]. [facility] will not allow insulin . At [facility] for cocaine rehab.

## 2023-07-07 NOTE — Progress Notes (Signed)
 CSW met with patient at bedside to offer substance abuse and shelter resources. Patient is agreeable to both. He did state he does not wish to return to [facility] at this time. CSW informed patient that he will receive a couple bus passes at discharge and pt expressed gratitude. EDP and RN notified via secure chat. No additional TOC needs.

## 2023-07-07 NOTE — Discharge Instructions (Signed)
 Resources are attached.  Continue home medications as prescribed.  Return to the emergency department for any new or worsening symptoms of concern.

## 2023-07-07 NOTE — ED Provider Notes (Addendum)
  EMERGENCY DEPARTMENT AT [facility] Provider Note   CSN: [id number] Arrival date & time: 07/07/23  7829     History  Chief Complaint  Patient presents with   Hyperglycemia    David Wyatt is a 63 y.o. male.   Hyperglycemia Associated symptoms: no abdominal pain, no chest pain, no nausea and no shortness of breath   Patient presents for hyperglycemia.  Medical history includes DM, neuropathy, HTN, HLD, arthritis, depression, GERD, cocaine abuse.  He was seen in the ED 2 days ago for medication refill.  At that time, he was recently discharged from behavioral health to [facility].  His insulin  prescriptions were not filled and he was sent to the ED.  [facility] reportedly will not allow insulin .  His blood sugars were elevated today.  Patient reports sugars were in the range of 260.  He has no physical complaints.  He states that he is currently 3 weeks sober.  He is homeless and has nowhere to go but does not want to stay at [facility].  He does not want to continue on his pathway of sobriety.  He states that his mood has been well.  He denies SI or HI.     Home Medications Prior to Admission medications   Medication Sig Start Date End Date Taking? Authorizing Provider  amLODipine  (NORVASC ) 10 MG tablet Take 1 tablet (10 mg total) by mouth daily. 07/03/23   Brent, Amanda C, NP  atorvastatin  (LIPITOR) 40 MG tablet Take 1 tablet (40 mg total) by mouth daily. 07/03/23   Brent, Amanda C, NP  cyclobenzaprine  (FLEXERIL ) 10 MG tablet Take 1 tablet (10 mg total) by mouth 2 (two) times daily as needed for muscle spasms. Patient not taking: Reported on 07/01/2023 05/28/23   Parmar, Meenakshi, MD  Dulaglutide (TRULICITY) 1.5 MG/0.5ML SOAJ Inject 1.5 mg into the skin once a week. 07/05/23   Sallyanne Creamer, DO  DULoxetine  (CYMBALTA ) 60 MG capsule Take 1 capsule (60 mg total) by mouth daily. 07/03/23   Brent, Amanda C, NP  gabapentin  (NEURONTIN ) 300 MG capsule Take 1 capsule (300 mg  total) by mouth at bedtime. 07/03/23   Brent, Amanda C, NP  glipiZIDE  (GLUCOTROL ) 10 MG tablet Take 1 tablet (10 mg total) by mouth daily before breakfast. 07/03/23   Brent, Amanda C, NP  hydrOXYzine  (ATARAX ) 25 MG tablet Take 1 tablet (25 mg total) by mouth 3 (three) times daily as needed for anxiety. 07/03/23   Brent, Amanda C, NP  insulin  aspart (NOVOLOG  FLEXPEN) 100 UNIT/ML FlexPen Inject 0-9 Units into the skin 3 (three) times daily with meals. 07/05/23   Sallyanne Creamer, DO  metFORMIN  (GLUCOPHAGE ) 1000 MG tablet Take 1 tablet (1,000 mg total) by mouth 2 (two) times daily. 07/03/23   Brent, Amanda C, NP  Multiple Vitamin (MULTIVITAMIN WITH MINERALS) TABS tablet Take 1 tablet by mouth daily. 07/03/23   Davia Erps, NP  naproxen  (NAPROSYN ) 250 MG tablet Take 1 tablet (250 mg total) by mouth 2 (two) times daily as needed (Pain). 07/03/23   Brent, Amanda C, NP  traZODone  (DESYREL ) 50 MG tablet Take 1 tablet (50 mg total) by mouth at bedtime. 07/03/23   Brent, Amanda C, NP      Allergies    Lisinopril     Review of Systems   Review of Systems  Respiratory:  Negative for shortness of breath.   Cardiovascular:  Negative for chest pain.  Gastrointestinal:  Negative for abdominal pain and nausea.  All  other systems reviewed and are negative.   Physical Exam Updated Vital Signs BP (!) 138/91   Pulse 85   Temp 98 F (36.7 C)   Resp 18   Ht 5\' 8"  (1.727 m)   Wt 65.8 kg   SpO2 95%   BMI 22.06 kg/m  Physical Exam Vitals and nursing note reviewed.  Constitutional:      General: He is not in acute distress.    Appearance: Normal appearance. He is well-developed. He is not ill-appearing, toxic-appearing or diaphoretic.  HENT:     Head: Normocephalic and atraumatic.     Right Ear: External ear normal.     Left Ear: External ear normal.     Nose: Nose normal.     Mouth/Throat:     Mouth: Mucous membranes are moist.  Eyes:     Extraocular Movements: Extraocular movements intact.      Conjunctiva/sclera: Conjunctivae normal.  Cardiovascular:     Rate and Rhythm: Normal rate and regular rhythm.  Pulmonary:     Effort: Pulmonary effort is normal. No respiratory distress.  Abdominal:     General: There is no distension.     Palpations: Abdomen is soft.     Tenderness: There is no abdominal tenderness.  Musculoskeletal:        General: No swelling. Normal range of motion.     Cervical back: Normal range of motion and neck supple.  Skin:    General: Skin is warm and dry.     Coloration: Skin is not jaundiced or pale.  Neurological:     General: No focal deficit present.     Mental Status: He is alert and oriented to person, place, and time.  Psychiatric:        Mood and Affect: Mood normal.        Behavior: Behavior normal.     ED Results / Procedures / Treatments   Labs (all labs ordered are listed, but only abnormal results are displayed) Labs Reviewed  CBG MONITORING, ED - Abnormal; Notable for the following components:      Result Value   Glucose-Capillary 226 (*)    All other components within normal limits    EKG None  Radiology No results found.  Procedures Procedures    Medications Ordered in ED Medications  atorvastatin  (LIPITOR) tablet 40 mg (has no administration in time range)  amLODipine  (NORVASC ) tablet 10 mg (has no administration in time range)  DULoxetine  (CYMBALTA ) DR capsule 60 mg (has no administration in time range)  gabapentin  (NEURONTIN ) capsule 300 mg (has no administration in time range)  insulin  aspart (NOVOLOG ) FlexPen 0-9 Units (has no administration in time range)  metFORMIN  (GLUCOPHAGE ) tablet 1,000 mg (has no administration in time range)    ED Course/ Medical Decision Making/ A&P                                 Medical Decision Making Risk Prescription drug management.   Patient presenting from [facility] for concern of hyperglycemia.  Per patient, his sugar was 261.  This is in the setting of not getting his  insulin  because this is reportedly not allowed at [facility].  Patient is there for help with recovery from cocaine abuse.  He states that his mood has been well.  He is well-appearing on exam.  CBG here in the ED is 226.  Patient is homeless and has nowhere to go but states  that he does not wish to return to [facility].  Social work was consulted for assistance.  Patient to board in the ED pending social work evaluation.  Diet and home medications were ordered.  Social work stated that they will offer resources with planned discharge to [facility].        Final Clinical Impression(s) / ED Diagnoses Final diagnoses:  Hyperglycemia    Rx / DC Orders ED Discharge Orders     None         Iva Mariner, MD 07/07/23 1610    Iva Mariner, MD 07/07/23 1027

## 2023-07-09 LAB — GLUCOSE, POCT (MANUAL RESULT ENTRY): POC Glucose: 301 mg/dL — AB (ref 70–99)

## 2023-07-09 NOTE — Congregational Nurse Program (Signed)
 Client to RN office for help with housing assistance and detox services. He states he cannot come back to [facility] because of insulin  SSI scale. RN called [facility] and they are unable to help him until blood sugar is better controlled. RN also called [facility] and they said his CBG needs to be stabilized before admission. Lastly, RN called [facility] and a VM left for them to call David Wyatt. Additionally, RN sent in a referral for [place] [facility] with the [facility] spoke with program director and was told about two programs that could help David Wyatt and they could get him housing quicker, will follow up on application on 07/10/23.  David Wyatt CBG is 20 and he is without access to medication until tomorrow when he gets paid. RN reminded him that he could get medicine at the [facility] pharmacy on [address] at reduced price to no cost. RN then referred client to ED for [facility] management. David Wyatt doesn't want to return to ED at this time. He is asymptomatic and has no complaints. RN will follow up with client tomorrow.

## 2023-07-15 ENCOUNTER — Other Ambulatory Visit: Payer: Self-pay

## 2023-07-15 ENCOUNTER — Ambulatory Visit (HOSPITAL_COMMUNITY)
Admission: EM | Admit: 2023-07-15 | Discharge: 2023-07-15 | Disposition: A | Discharge status: Home / Self Care | Payer: MEDICAID | Source: Home / Self Care

## 2023-07-15 ENCOUNTER — Emergency Department (HOSPITAL_COMMUNITY)
Admission: EM | Admit: 2023-07-15 | Discharge: 2023-07-15 | Disposition: A | Discharge status: Home / Self Care | Payer: MEDICAID | Attending: Emergency Medicine | Admitting: Emergency Medicine

## 2023-07-15 DIAGNOSIS — Z5900 Homelessness unspecified: Secondary | ICD-10-CM | POA: Insufficient documentation

## 2023-07-15 DIAGNOSIS — E1140 Type 2 diabetes mellitus with diabetic neuropathy, unspecified: Secondary | ICD-10-CM | POA: Insufficient documentation

## 2023-07-15 DIAGNOSIS — R45851 Suicidal ideations: Secondary | ICD-10-CM | POA: Insufficient documentation

## 2023-07-15 DIAGNOSIS — Z794 Long term (current) use of insulin: Secondary | ICD-10-CM | POA: Insufficient documentation

## 2023-07-15 DIAGNOSIS — Z79899 Other long term (current) drug therapy: Secondary | ICD-10-CM | POA: Diagnosis not present

## 2023-07-15 DIAGNOSIS — F1490 Cocaine use, unspecified, uncomplicated: Secondary | ICD-10-CM | POA: Diagnosis present

## 2023-07-15 DIAGNOSIS — F1410 Cocaine abuse, uncomplicated: Secondary | ICD-10-CM | POA: Insufficient documentation

## 2023-07-15 DIAGNOSIS — F32A Depression, unspecified: Secondary | ICD-10-CM | POA: Insufficient documentation

## 2023-07-15 DIAGNOSIS — F1414 Cocaine abuse with cocaine-induced mood disorder: Secondary | ICD-10-CM | POA: Diagnosis present

## 2023-07-15 DIAGNOSIS — R739 Hyperglycemia, unspecified: Secondary | ICD-10-CM

## 2023-07-15 DIAGNOSIS — I10 Essential (primary) hypertension: Secondary | ICD-10-CM | POA: Insufficient documentation

## 2023-07-15 DIAGNOSIS — F1994 Other psychoactive substance use, unspecified with psychoactive substance-induced mood disorder: Secondary | ICD-10-CM | POA: Diagnosis present

## 2023-07-15 DIAGNOSIS — Z91148 Patient's other noncompliance with medication regimen for other reason: Secondary | ICD-10-CM | POA: Insufficient documentation

## 2023-07-15 DIAGNOSIS — Z7984 Long term (current) use of oral hypoglycemic drugs: Secondary | ICD-10-CM | POA: Insufficient documentation

## 2023-07-15 DIAGNOSIS — Z765 Malingerer [conscious simulation]: Secondary | ICD-10-CM | POA: Insufficient documentation

## 2023-07-15 DIAGNOSIS — E1165 Type 2 diabetes mellitus with hyperglycemia: Secondary | ICD-10-CM | POA: Insufficient documentation

## 2023-07-15 DIAGNOSIS — Z5901 Sheltered homelessness: Secondary | ICD-10-CM

## 2023-07-15 LAB — CBC
HCT: 43 % (ref 39.0–52.0)
Hemoglobin: 14 g/dL (ref 13.0–17.0)
MCH: 31.5 pg (ref 26.0–34.0)
MCHC: 32.6 g/dL (ref 30.0–36.0)
MCV: 96.6 fL (ref 80.0–100.0)
Platelets: 419 10*3/uL — ABNORMAL HIGH (ref 150–400)
RBC: 4.45 MIL/uL (ref 4.22–5.81)
RDW: 13.1 % (ref 11.5–15.5)
WBC: 4.9 10*3/uL (ref 4.0–10.5)
nRBC: 0 % (ref 0.0–0.2)

## 2023-07-15 LAB — COMPREHENSIVE METABOLIC PANEL WITH GFR
ALT: 16 U/L (ref 0–44)
AST: 18 U/L (ref 15–41)
Albumin: 3.3 g/dL — ABNORMAL LOW (ref 3.5–5.0)
Alkaline Phosphatase: 95 U/L (ref 38–126)
Anion gap: 6 (ref 5–15)
BUN: 12 mg/dL (ref 8–23)
CO2: 27 mmol/L (ref 22–32)
Calcium: 8.9 mg/dL (ref 8.9–10.3)
Chloride: 106 mmol/L (ref 98–111)
Creatinine, Ser: 1.09 mg/dL (ref 0.61–1.24)
GFR, Estimated: >60 mL/min (ref >60)
Glucose, Bld: 407 mg/dL — ABNORMAL HIGH (ref 70–99)
Potassium: 3.9 mmol/L (ref 3.5–5.1)
Sodium: 139 mmol/L (ref 135–145)
Total Bilirubin: 0.4 mg/dL (ref 0.0–1.2)
Total Protein: 6.8 g/dL (ref 6.5–8.1)

## 2023-07-15 LAB — ETHANOL: Alcohol, Ethyl (B): <15 mg/dL (ref <15)

## 2023-07-15 MED ORDER — METFORMIN HCL 500 MG PO TABS
1000.0000 mg | ORAL_TABLET | Freq: Once | ORAL | Status: AC
  Administered 2023-07-15: 1000 mg via ORAL
  Filled 2023-07-15: qty 2

## 2023-07-15 MED ORDER — GLIPIZIDE 10 MG PO TABS
10.0000 mg | ORAL_TABLET | Freq: Once | ORAL | Status: AC
  Administered 2023-07-15: 10 mg via ORAL
  Filled 2023-07-15: qty 1

## 2023-07-15 NOTE — ED Notes (Signed)
 Pt got something to drink and then stated he needed to leave.

## 2023-07-15 NOTE — ED Notes (Signed)
 Pt given scrubs to change into

## 2023-07-15 NOTE — ED Triage Notes (Signed)
 Pt arrives ambulatory with walker by pov c/o SI and HI. Pt is homeless and states he is tired.

## 2023-07-15 NOTE — ED Provider Notes (Signed)
 Behavioral Health Urgent Care Medical Screening Exam  Patient Name: David Wyatt MRN: [id number] Date of Evaluation: 07/16/23 Chief Complaint:  shelter are full and I need somewhere to stay  Diagnosis:  Final diagnoses:  Malingering  Homelessness  Cocaine abuse (HCC)    History of Present illness: David Wyatt is a 63 y.o. male. With a history of homelessness, SI, cocaine abuse, presented to [facility], seeking somewhere to stay.  Review of patient records show that patient was seen by psychiatry today in the ED and recommended for discharge.  However right after discharge patient came straight over to [facility].  According to the patient he went to the shelter and they are full so he needs somewhere to stay.  Writer discussed with patient that he needs to reach out to other shelters because he does not meet criteria for admissions at this time.  Denies seeing a psychiatrist or therapist at this time.  Show no sign of distress at this time.  To face observation of patient, patient is alert and oriented x 4, speech is clear, maintain eye contact.  Patient is observed in on the chair in the room.  With a rolling walker.  Patient when asked if he was suicidal patient hesitated then stated kind of but he just needs somewhere to stay.  When asked if he is homeless patient stated yes and stated that the shelters were full.  Patient denies HI, AVH or paranoia.  Endorsed using cocaine yesterday worth $50.  At this present moment patient does not seem to be in any distress.  Patient however does seem to be malingering due to homelessness.  Patient is advised to call 911 or return to the nearest ED should he experience suicidal ideation homicidal thoughts or hallucination.  When Clinical research associate discussed with patient that he would be discharged patient stated just to give me a bus pass so I can get to one of the other shelters.   Recommend discharge the patient to follow-up with the shelter for housing  assistance.  Flowsheet Row ED from 07/15/2023 in [facility] Most recent reading at 07/15/2023  6:55 PM ED from 07/15/2023 in [facility] Emergency Department at [facility] Most recent reading at 07/15/2023  1:05 PM ED from 07/07/2023 in [facility] Emergency Department at [facility] Most recent reading at 07/07/2023  9:36 AM  C-SSRS RISK CATEGORY Low Risk Error: Q7 should not be populated when Q6 is No Error: Q3, 4, or 5 should not be populated when Q2 is No       Psychiatric Specialty Exam  Presentation  General Appearance:Casual  Eye Contact:Good  Speech:Clear and Coherent  Speech Volume:Normal  Handedness:Right   Mood and Affect  Mood: Anxious  Affect: Congruent   Thought Process  Thought Processes: Coherent  Descriptions of Associations:Intact  Orientation:Full (Time, Place and Person)  Thought Content:Logical  Diagnosis of Schizophrenia or Schizoaffective disorder in past: No   Hallucinations:None  Ideas of Reference:None  Suicidal Thoughts:No Without Intent; Without Plan  Homicidal Thoughts:No   Sensorium  Memory: Recent Fair; Immediate Good  Judgment: Intact  Insight: Fair   Chartered certified accountant: Fair  Attention Span: Fair  Recall: [employer] of Knowledge: Fair  Language: Fair   Psychomotor Activity  Psychomotor Activity: Normal   Assets  Assets: Desire for Improvement; Housing   Sleep  Sleep: Fair  Number of hours:  7   Physical Exam: Physical Exam HENT:     Head: Normocephalic.  Nose: Nose normal.  Eyes:     Pupils: Pupils are equal, round, and reactive to light.  Cardiovascular:     Rate and Rhythm: Normal rate.  Pulmonary:     Effort: Pulmonary effort is normal.  Musculoskeletal:        General: Normal range of motion.     Cervical back: Normal range of motion.  Neurological:     General: No focal deficit present.     Mental Status: He  is alert.  Psychiatric:        Mood and Affect: Mood normal.    Review of Systems  Constitutional: Negative.   HENT: Negative.    Eyes: Negative.   Respiratory: Negative.    Cardiovascular: Negative.   Gastrointestinal: Negative.   Genitourinary: Negative.   Musculoskeletal: Negative.   Skin: Negative.   Neurological: Negative.   Psychiatric/Behavioral:  Positive for depression. The patient is nervous/anxious.    There were no vitals taken for this visit. There is no height or weight on file to calculate BMI.  Musculoskeletal: Strength & Muscle Tone: within normal limits Gait & Station: normal Patient leans: N/A   [facility] MSE Discharge Disposition for Follow up and Recommendations: Based on my evaluation the patient does not appear to have an emergency medical condition and can be discharged with resources and follow up care in outpatient services for Substance Abuse Intensive Outpatient Program   Dorthea Gauze, NP 07/16/2023, 5:09 AM

## 2023-07-15 NOTE — ED Notes (Signed)
 Pt belongings placed in locker number 3. Security here to wand pt

## 2023-07-15 NOTE — Discharge Instructions (Addendum)
 [facility] with [facility]

## 2023-07-15 NOTE — ED Provider Triage Note (Signed)
 Emergency Medicine Provider Triage Evaluation Note  David Wyatt , a 63 y.o. male  was evaluated in triage.  Pt complains of SI. States he is homeless and tired. No plan. No HI or AVH.  Review of Systems  Positive:  Negative:   Physical Exam  BP (!) 142/99 (BP Location: Right Arm)   Pulse 75   Temp 97.7 F (36.5 C) (Oral)   Resp 18   Ht 5\' 8"  (1.727 m)   Wt 70.3 kg   SpO2 98%   BMI 23.57 kg/m  Gen:   Awake, no distress   Resp:  Normal effort MSK:   Moves extremities without difficulty  Other:    Medical Decision Making  Medically screening exam initiated at 1:47 PM.  Appropriate orders placed.  David Wyatt was informed that the remainder of the evaluation will be completed by another provider, this initial triage assessment does not replace that evaluation, and the importance of remaining in the ED until their evaluation is complete.     Sherra Dk, PA-C 07/15/23 1348

## 2023-07-15 NOTE — ED Provider Notes (Signed)
 [facility] EMERGENCY DEPARTMENT AT [facility] Provider Note   CSN: [id number] Arrival date & time: 07/15/23  1253     History  Chief Complaint  Patient presents with   Suicidal    David Wyatt is a 63 y.o. male.  HPI 63 year old male presents for evaluation of suicidal thoughts.  He states he is also homeless and addicted to drugs and wants help.  He denies any somatic complaints such as headache, fever, cough, chest pain.  He has not been taking his meds due to no access.  Home Medications Prior to Admission medications   Medication Sig Start Date End Date Taking? Authorizing Provider  amLODipine  (NORVASC ) 10 MG tablet Take 1 tablet (10 mg total) by mouth daily. 07/03/23   Davia Erps, NP  atorvastatin  (LIPITOR) 40 MG tablet Take 1 tablet (40 mg total) by mouth daily. 07/03/23   Brent, Amanda C, NP  cyclobenzaprine  (FLEXERIL ) 10 MG tablet Take 1 tablet (10 mg total) by mouth 2 (two) times daily as needed for muscle spasms. 05/28/23   Silas Drivers, MD  Dulaglutide  (TRULICITY ) 1.5 MG/0.5ML SOAJ Inject 1.5 mg into the skin once a week. 07/05/23   Sallyanne Creamer, DO  DULoxetine  (CYMBALTA ) 60 MG capsule Take 1 capsule (60 mg total) by mouth daily. 07/03/23   Davia Erps, NP  gabapentin  (NEURONTIN ) 300 MG capsule Take 1 capsule (300 mg total) by mouth at bedtime. 07/03/23   Brent, Amanda C, NP  glipiZIDE  (GLUCOTROL ) 10 MG tablet Take 1 tablet (10 mg total) by mouth daily before breakfast. 07/03/23   Brent, Amanda C, NP  hydrOXYzine  (ATARAX ) 25 MG tablet Take 1 tablet (25 mg total) by mouth 3 (three) times daily as needed for anxiety. 07/03/23   Brent, Amanda C, NP  insulin  aspart (NOVOLOG  FLEXPEN) 100 UNIT/ML FlexPen Inject 0-9 Units into the skin 3 (three) times daily with meals. 07/05/23   Sallyanne Creamer, DO  metFORMIN  (GLUCOPHAGE ) 1000 MG tablet Take 1 tablet (1,000 mg total) by mouth 2 (two) times daily. 07/03/23   Brent, Amanda C, NP  Multiple Vitamin (MULTIVITAMIN  WITH MINERALS) TABS tablet Take 1 tablet by mouth daily. 07/03/23   Brent, Amanda C, NP  naproxen  (NAPROSYN ) 250 MG tablet Take 1 tablet (250 mg total) by mouth 2 (two) times daily as needed (Pain). 07/03/23   Brent, Amanda C, NP  traZODone  (DESYREL ) 50 MG tablet Take 1 tablet (50 mg total) by mouth at bedtime. 07/03/23   Brent, Amanda C, NP      Allergies    Lisinopril     Review of Systems   Review of Systems  Constitutional:  Negative for fever.  Respiratory:  Negative for cough.   Cardiovascular:  Negative for chest pain.  Neurological:  Negative for headaches.    Physical Exam Updated Vital Signs BP (!) 142/99 (BP Location: Right Arm)   Pulse 75   Temp 97.7 F (36.5 C) (Oral)   Resp 18   Ht 5\' 8"  (1.727 m)   Wt 70.3 kg   SpO2 98%   BMI 23.57 kg/m  Physical Exam Vitals and nursing note reviewed.  Constitutional:      General: He is not in acute distress.    Appearance: He is well-developed. He is not ill-appearing or diaphoretic.  HENT:     Head: Normocephalic and atraumatic.  Cardiovascular:     Rate and Rhythm: Normal rate and regular rhythm.     Heart sounds: Normal heart sounds.  Pulmonary:  Effort: Pulmonary effort is normal.     Breath sounds: Normal breath sounds.  Abdominal:     General: There is no distension.  Skin:    General: Skin is warm and dry.  Neurological:     Mental Status: He is alert.  Psychiatric:        Thought Content: Thought content includes suicidal ideation.     ED Results / Procedures / Treatments   Labs (all labs ordered are listed, but only abnormal results are displayed) Labs Reviewed  COMPREHENSIVE METABOLIC PANEL WITH GFR - Abnormal; Notable for the following components:      Result Value   Glucose, Bld 407 (*)    Albumin 3.3 (*)    All other components within normal limits  CBC - Abnormal; Notable for the following components:   Platelets 419 (*)    All other components within normal limits  ETHANOL  RAPID URINE  DRUG SCREEN, HOSP PERFORMED    EKG None  Radiology No results found.  Procedures Procedures    Medications Ordered in ED Medications  metFORMIN  (GLUCOPHAGE ) tablet 1,000 mg (has no administration in time range)  glipiZIDE  (GLUCOTROL ) tablet 10 mg (has no administration in time range)    ED Course/ Medical Decision Making/ A&P                                 Medical Decision Making Amount and/or Complexity of Data Reviewed Labs:     Details: Hyperglycemia without acidosis.  Risk Prescription drug management.   Patient presents with suicidal thoughts.  He was seen by psychiatry and cleared for discharge.  They suspect malingering given previous similar ED visits.  Patient otherwise has no specific symptoms.  He will be given metformin  and glipizide  which she has not been taking as an outpatient.  Otherwise, vital signs are reassuring.  Patient became quite upset when informed that he would be discharged.  Will be given resources.        Final Clinical Impression(s) / ED Diagnoses Final diagnoses:  Homelessness  Hyperglycemia    Rx / DC Orders ED Discharge Orders     None         Jerilynn Montenegro, MD 07/15/23 1709

## 2023-07-15 NOTE — Consult Note (Signed)
 [facility] Psychiatric Consult Initial  Patient Name: .David Wyatt  MRN: [id number]  DOB: October 06, 1960  Consult Order details:  Orders (From admission, onward)     Start     Ordered   07/15/23 1348  CONSULT TO CALL ACT TEAM       Ordering Provider: Sherra Dk, PA-C  Provider:  (Not yet assigned)  Question:  Reason for Consult?  Answer:  Psych consult   07/15/23 1347             Mode of Visit: In person    Psychiatry Consult Evaluation  Service Date: Jul 15, 2023 LOS:  LOS: 0 days  Chief Complaint SI, homelessness  Primary Psychiatric Diagnoses  Substance induced mood disorder 2.  homelessness 3.  Crack cocaine abuse  Assessment  David Wyatt is a 63 y.o. male admitted: Presented to the EDfor 07/15/2023  1:01 PM for passive SI, substance abuse, homelessness.  His current presentation of passive SI is most consistent with malingering. [name] He was not compliant with medications or previous treatment plans prior to admission as evidenced by patient report. On initial examination, patient states he needs somewhere to stay. Please see plan below for detailed recommendations.   Diagnoses:  Active Hospital problems: Principal Problem:   Substance induced mood disorder (HCC) Active Problems:   Crack cocaine use   Homelessness   Malingering    Plan   ## Psychiatric Medication Recommendations:  - continue home medications, no changes  ## Medical Decision Making Capacity: Not specifically addressed in this encounter  ## Further Work-up:  UDS pending   ## Disposition:-- There are no psychiatric contraindications to discharge at this time  ## Behavioral / Environmental: - No specific recommendations at this time.     ## Safety and Observation Level:  - Based on my clinical evaluation, I estimate the patient to be at low risk of self harm in the current setting. - At this time, we recommend  routine. This decision is based on my review of the chart including patient's  history and current presentation, interview of the patient, mental status examination, and consideration of suicide risk including evaluating suicidal ideation, plan, intent, suicidal or self-harm behaviors, risk factors, and protective factors. This judgment is based on our ability to directly address suicide risk, implement suicide prevention strategies, and develop a safety plan while the patient is in the clinical setting. Please contact our team if there is a concern that risk level has changed.  CSSR Risk Category:C-SSRS RISK CATEGORY: Error: Q7 should not be populated when Q6 is No  Suicide Risk Assessment: Patient has following modifiable risk factors for suicide: recklessness and medication noncompliance, which we are addressing by encouraging compliance. Patient has following non-modifiable or demographic risk factors for suicide: male gender Patient has the following protective factors against suicide: Access to outpatient mental health care, Cultural, spiritual, or religious beliefs that discourage suicide, and no history of suicide attempts  Thank you for this consult request. Recommendations have been communicated to the primary team.  We will psych clear for discharge at this time.   [name], NP       History of Present Illness  Relevant Aspects of Hospital ED Course:  Admitted on 07/15/2023 for passive SI, substance abuse, and homelessness stating "he is tired." Pt has presented to ED/UC before for similar presentations of substance abuse and homelessness.   Patient Report:  [facility] seen at [facility] ED for face-to-face psychiatric evaluation.  Patient had recently  presented to this ED where an assessment was completed by this provider.  He agreed to go to [facility] residential for walking assessment.  However patient did not make it there in time and then presented to the behavioral health urgent care.  After overnight admission there he was transferred to [facility]  residential for treatment the next day.  Patient did not inform team that he needed insulin , and [facility] does not allow insulin  in the program.  Patient was then discharged 2 days later.  Patient stated he has no money and is tired of being homeless.  He does endorse chronic passive suicidal ideations.  Denies any specific plan or intent.  Denies any recent or previous suicide attempts.  Patient stated having somewhere to stay and being sober would stop his suicidal ideations.  Patient has continued to use crack cocaine as well as THC and alcohol occasionally.  Patient denies HI.  Denies AVH.  Patient requesting to stay in the hospital.  He does admit to not being compliant with his medications outside of the hospital nor following up with any outpatient treatment plans.  Pt denies access to weapons or firearms. When asked if he is safe to leave the hospital he states "I don't know." Patient stated he is interested in substance abuse treatment if he gives him somewhere to stay.  This patient has a history of malingering evident by multiple emergency room (ED), urgent care (UC) visits, and psychiatric hospitalizations with similar complaints of substance abuse and suicidal ideation.  Patient has been observed in the ED  and there has been no unsafe behavior observed. Patients' suicidal ideation is conditional and used as a means of manipulation and/or attention seeking without true intention to harm himself. His behaviors are more consistent with someone who is acting in self-preservation, rather that someone who is acutely suicidal.  Given all that is stated above including the fact that the patient shows no signs of mania or psychosis, has a liner, coherent thought process throughout assessment the patient does not meet criteria for inpatient psychiatric hospitalization or further psychiatric evaluation at this time.  Patient would benefit from outpatient psychiatric services which resources will be provided  along with community resources prior to discharge.    Patient ongoing endorsement of suicidal ideation shows clear evidence of secondary gain of unmet needs that is representative of limited and often- maladaptive coping skills and rather than an indicator of imminent risk of death.  Evidence indicates that subsequent suicide attempts by patients who made contingent suicide threats (defined as threatened suicide or exaggerated suicidality) are uncommon in both groups.  Hospitalization should not be used as a substitute for social services, substance abuse treatment, and legal assistance for patients who make contingent suicide threats (Characteristics and six-month outcome of patients who use suicide threats to seek hospital admission.  (1966). Psychiatric Services, 47(8), 5413105848. (DOI: 10.1176/ps.47.8.871).    Patient has not benefited from past hospitalization under similar circumstances in terms of suicide risk modification, improvement in mental health, or social conditions.  Patients repeated use of ED, UC, and psychiatric hospitalization services instead of recommended outpatient follow-up is ineffective in helping patients' improvement.  Psychiatric hospitalization is not indicated, and the patient's refusal of interventions that have been offered by clinical care teams during prior stays in the ED, UC and during psychiatric hospitalization to mitigate risk of self-harm, other than allowing care team to observe to sobriety or behavior.  This provider and Dr. Deborah Falling have discussed assessment and treatment recommendations  with patient.  Further we see no evidence of severe psychosis, cognitive impairment, intoxication, or other condition that prevents the patient from acting under their own choice and volition.      Shelter, outpatient, and substance abuse resources have been provided in [facility]. Pt will be offered bus passes upon discharge to assist with transportation.   Review of Systems   Psychiatric/Behavioral:  Positive for depression and substance abuse.        Homeless  All other systems reviewed and are negative.    Psychiatric and Social History  Psychiatric History:  Information collected from patient, chart   Prev Dx/Sx: depression substance abuse Current Psych Provider: denies Home Meds (current): cymbalta , gabapentin  Previous Med Trials: unknown Therapy: denies   Prior Psych Hospitalization: yes  Prior Self Harm: denies Prior Violence: denies     Social History:  Occupational Hx: unemployed Armed forces operational officer Hx: denies Living Situation: homeless Spiritual Hx: yes Access to weapons/lethal means: denies    Substance History Alcohol: occasional, denies daily  Type of alcohol beer or liquor Tobacco: yes Illicit drugs: cocaine, thc Prescription drug abuse: denies Rehab hx: yes   Exam Findings  Physical Exam:  Vital Signs:  Temp:  [97.7 F (36.5 C)] 97.7 F (36.5 C) (05/06 1304) Pulse Rate:  [75] 75 (05/06 1304) Resp:  [18] 18 (05/06 1304) BP: (142)/(99) 142/99 (05/06 1304) SpO2:  [98 %] 98 % (05/06 1304) Weight:  [70.3 kg] 70.3 kg (05/06 1304) Blood pressure (!) 142/99, pulse 75, temperature 97.7 F (36.5 C), temperature source Oral, resp. rate 18, height 5\' 8"  (1.727 m), weight 70.3 kg, SpO2 98%. Body mass index is 23.57 kg/m.  Physical Exam Vitals and nursing note reviewed.  Neurological:     Mental Status: He is alert and oriented to person, place, and time.      Mental Status Exam: General Appearance: Fairly Groomed  Orientation:  Full (Time, Place, and Person)  Memory:  Immediate;   Good Recent;   Good  Concentration:  Concentration: Good  Recall:  Good  Attention  Fair  Eye Contact:  Good  Speech:  Normal Rate  Language:  Good  Volume:  Normal  Mood: "okay"  Affect:  Congruent  Thought Process:  Goal Directed  Thought Content:  WDL  Suicidal Thoughts:  No  Homicidal Thoughts:  No  Judgement:  Fair  Insight:  Fair   Psychomotor Activity:  Normal  Akathisia:  No  Fund of Knowledge:  Good       Assets:  Communication Skills Desire for Improvement Leisure Time Physical Health Resilience  Cognition:  WNL  ADL's:  Intact  AIMS (if indicated):       Other History   These have been pulled in through the EMR, reviewed, and updated if appropriate.  Family History:  The patient's family history includes Diabetes in his father and mother; Hyperlipidemia in his father and mother; Kidney disease in his mother.  Medical History: Past Medical History:  Diagnosis Date   Angioedema of lips 07/28/2012   left upper (07/29/2012)   Arthritis    hands and back   Chronic back pain    Cocaine abuse (HCC)    Diabetic neuropathy (HCC)    High cholesterol    Hypertension    Neuropathy    Type II diabetes mellitus (HCC)     Surgical History: Past Surgical History:  Procedure Laterality Date   BACK SURGERY     CYSTOSCOPY W/ URETERAL STENT PLACEMENT Right 02/23/2021   Procedure:  CYSTOSCOPY WITH RETROGRADE PYELOGRAM/URETERAL STENT PLACEMENT;  Surgeon: Lahoma Pigg, MD;  Location: [facility];  Service: Urology;  Laterality: Right;   CYSTOSCOPY/URETEROSCOPY/HOLMIUM LASER/STENT PLACEMENT Right 12/05/2022   Procedure: CYSTOSCOPY, RIGHT URETEROSCOPY, AND RIGHT URETERAL STENT PLACEMENT;  Surgeon: Thelbert Finner, MD;  Location: [facility];  Service: Urology;  Laterality: Right;  90 MINUTES   HERNIA REPAIR Right 04/01/2012   I & D EXTREMITY Left 12/13/2012   Procedure: IRRIGATION AND DEBRIDEMENT LEFT THUMB;  Surgeon: Milagros Alf, MD;  Location: [facility] OR;  Service: Orthopedics;  Laterality: Left;   INCISION AND DRAINAGE OF WOUND     boil on back/notes 07/14/2008 (07/29/2012)   INGUINAL HERNIA REPAIR  04/01/2012   Procedure: HERNIA REPAIR INGUINAL ADULT;  Surgeon: Darcella Earnest, MD;  Location: [facility] OR;  Service: General;  Laterality: Right;   INGUINAL HERNIA REPAIR Left 09/06/2016   Procedure: OPEN REPAIR LEFT INGUINAL  HERNIA;  Surgeon: Aldean Hummingbird, MD;  Location: [facility] OR;  Service: General;  Laterality: Left;   INSERTION OF MESH Left 09/06/2016   Procedure: INSERTION OF MESH;  Surgeon: Aldean Hummingbird, MD;  Location: [facility] OR;  Service: General;  Laterality: Left;   TONSILLECTOMY       Medications:  No current facility-administered medications for this encounter.  Current Outpatient Medications:    amLODipine  (NORVASC ) 10 MG tablet, Take 1 tablet (10 mg total) by mouth daily., Disp: 30 tablet, Rfl: 0   atorvastatin  (LIPITOR) 40 MG tablet, Take 1 tablet (40 mg total) by mouth daily., Disp: 30 tablet, Rfl: 0   cyclobenzaprine  (FLEXERIL ) 10 MG tablet, Take 1 tablet (10 mg total) by mouth 2 (two) times daily as needed for muscle spasms., Disp: 20 tablet, Rfl: 0   Dulaglutide  (TRULICITY ) 1.5 MG/0.5ML SOAJ, Inject 1.5 mg into the skin once a week., Disp: 2 mL, Rfl: 0   DULoxetine  (CYMBALTA ) 60 MG capsule, Take 1 capsule (60 mg total) by mouth daily., Disp: 30 capsule, Rfl: 0   gabapentin  (NEURONTIN ) 300 MG capsule, Take 1 capsule (300 mg total) by mouth at bedtime., Disp: 90 capsule, Rfl: 0   glipiZIDE  (GLUCOTROL ) 10 MG tablet, Take 1 tablet (10 mg total) by mouth daily before breakfast., Disp: 30 tablet, Rfl: 0   hydrOXYzine  (ATARAX ) 25 MG tablet, Take 1 tablet (25 mg total) by mouth 3 (three) times daily as needed for anxiety., Disp: 30 tablet, Rfl: 0   insulin  aspart (NOVOLOG  FLEXPEN) 100 UNIT/ML FlexPen, Inject 0-9 Units into the skin 3 (three) times daily with meals., Disp: 30 mL, Rfl: 0   metFORMIN  (GLUCOPHAGE ) 1000 MG tablet, Take 1 tablet (1,000 mg total) by mouth 2 (two) times daily., Disp: 30 tablet, Rfl: 0   Multiple Vitamin (MULTIVITAMIN WITH MINERALS) TABS tablet, Take 1 tablet by mouth daily., Disp: 30 tablet, Rfl: 0   naproxen  (NAPROSYN ) 250 MG tablet, Take 1 tablet (250 mg total) by mouth 2 (two) times daily as needed (Pain)., Disp: 30 tablet, Rfl: 0   traZODone  (DESYREL ) 50 MG tablet, Take 1 tablet (50  mg total) by mouth at bedtime., Disp: 30 tablet, Rfl: 0  Allergies: Allergies  Allergen Reactions   Lisinopril  Other (See Comments) and Swelling    Lip Swelling, angioedema  angioedema  Lip Swelling  Lip Swelling, angioedema    [name], NP

## 2023-07-15 NOTE — Progress Notes (Signed)
   07/15/23 1839  [facility] Triage Screening (Walk-ins at [facility] only)  How Did You Hear About Us ? Legal System  What Is the Reason for Your Visit/Call Today? David Wyatt presents to [facility] voluntarily via [employer]. Pt is homeless and states that he is suicidal. Pt states that he had these thoughts today without a plan as of yet. Pt states that he still wants to harm himself at this time. Pt now shares that he needs detox from crack. Pt states that he use 50 dollars worth of crack on yesterday. Pt currently denies HI, AVH and alcohol use.  How Long Has This Been Causing You Problems? <Week  Have You Recently Had Any Thoughts About Hurting Yourself? Yes  How long ago did you have thoughts about hurting yourself? today - plan not yet  Are You Planning to Commit Suicide/Harm Yourself At This time? Yes  Have you Recently Had Thoughts About Hurting Someone [name]? No  Are You Planning To Harm Someone At This Time? No  Physical Abuse Denies  Verbal Abuse Denies  Sexual Abuse Denies  Exploitation of patient/patient's resources Denies  Self-Neglect Denies  Are you currently experiencing any auditory, visual or other hallucinations? No  Have You Used Any Alcohol or Drugs in the Past 24 Hours? Yes  What Did You Use and How Much? yesterday - crack 50 dollars worth  Do you have any current medical co-morbidities that require immediate attention? Yes  Please describe current medical co-morbidities that require immediate attention: high blood pressure, diabetes, high cholestrol  Clinician description of patient physical appearance/behavior: nasty attitude, irritated  What Do You Feel Would Help You the Most Today? Alcohol or Drug Use Treatment;Treatment for Depression or other mood problem;Housing Assistance  If access to [facility] was not available, would you have sought care in the Emergency Department? Yes  Determination of Need Routine (7 days)  Options For Referral Other: Comment;Inpatient  Hospitalization;Facility-Based Crisis;Outpatient Therapy (shelters)

## 2023-07-16 ENCOUNTER — Other Ambulatory Visit: Payer: Self-pay

## 2023-07-17 ENCOUNTER — Ambulatory Visit: Payer: Self-pay

## 2023-07-17 NOTE — Telephone Encounter (Signed)
 Chief Complaint: Need letter from pcp Symptoms: back pain Frequency: chronic Pertinent Negatives: Patient denies need for triage, new symptoms Disposition: [] ED /[] Urgent Care (no appt availability in office) / [] Appointment(In office/virtual)/ []  [place] [facility]/ [] Home Care/ [] Refused Recommended Disposition /[] [place] [facility]/ [x]  Follow-up with PCP Additional Notes:  Patient transferred to nurse triage, requesting letter. David Wyatt calling to request a letter from Dr. Newlin that he has had back surgery- spinal fusion and needs to have a place to rest. He current stays at homeless shelter but they do not allow him to stay during the day between 8am-5pm, he needs letter stating he needs access to shelter during day hours due to medical condition. Declines triage. Please follow up with David Wyatt [phone]  Copied from CRM [id number]. Topic: Clinical - Red Word Triage >> Jul 17, 2023  4:09 PM David Wyatt Herb L wrote: Red Word that prompted transfer to Nurse Triage: back pain, hurts to walk. Requesting letter from provider Reason for Disposition  [1] Caller requests to speak ONLY to PCP AND [2] NON-URGENT question  Protocols used: PCP Call - No Triage-A-AH

## 2023-07-18 NOTE — Telephone Encounter (Signed)
 Routing to PCP for review.

## 2023-07-21 ENCOUNTER — Other Ambulatory Visit (HOSPITAL_BASED_OUTPATIENT_CLINIC_OR_DEPARTMENT_OTHER): Payer: Self-pay

## 2023-07-21 ENCOUNTER — Other Ambulatory Visit: Payer: Self-pay

## 2023-07-22 ENCOUNTER — Telehealth: Payer: Self-pay | Admitting: Family Medicine

## 2023-07-22 ENCOUNTER — Other Ambulatory Visit (HOSPITAL_BASED_OUTPATIENT_CLINIC_OR_DEPARTMENT_OTHER): Payer: Self-pay

## 2023-07-22 NOTE — Telephone Encounter (Signed)
 Letter for shelter written as requested.

## 2023-07-22 NOTE — Telephone Encounter (Signed)
 Letter is ready.

## 2023-07-23 ENCOUNTER — Telehealth: Payer: Self-pay | Admitting: Family Medicine

## 2023-07-23 ENCOUNTER — Other Ambulatory Visit: Payer: Self-pay | Admitting: Family Medicine

## 2023-07-23 ENCOUNTER — Other Ambulatory Visit (HOSPITAL_BASED_OUTPATIENT_CLINIC_OR_DEPARTMENT_OTHER): Payer: Self-pay

## 2023-07-23 ENCOUNTER — Other Ambulatory Visit: Payer: Self-pay

## 2023-07-23 MED ORDER — PEN NEEDLES 32G X 4 MM MISC
1.0000 | 0 refills | Status: DC
  Filled 2023-07-23: qty 100, 33d supply, fill #0

## 2023-07-23 MED ORDER — PEN NEEDLES 32G X 4 MM MISC: 1.0000 | 0 refills | Status: DC

## 2023-07-23 NOTE — Telephone Encounter (Signed)
 Refill has been sent.

## 2023-07-23 NOTE — Telephone Encounter (Signed)
 Patient has been called and letter will be faxed to [phone]

## 2023-07-23 NOTE — Telephone Encounter (Signed)
 Copied from CRM [id number]. Topic: General - Other >> Jul 23, 2023  2:32 PM Alysia Jumbo S wrote:  Reason for CRM: Debria Fang with [facility] states that patient is needing "BB Ultra fine short pen needles" sent to [facility] at [address] in [place], [place].  Item is not listed on med list and he does not know the needle size.  Callback number [phone].

## 2023-07-24 ENCOUNTER — Other Ambulatory Visit (HOSPITAL_BASED_OUTPATIENT_CLINIC_OR_DEPARTMENT_OTHER): Payer: Self-pay

## 2023-07-25 ENCOUNTER — Other Ambulatory Visit: Payer: Self-pay

## 2023-07-25 ENCOUNTER — Other Ambulatory Visit (HOSPITAL_BASED_OUTPATIENT_CLINIC_OR_DEPARTMENT_OTHER): Payer: Self-pay

## 2023-07-25 ENCOUNTER — Ambulatory Visit: Payer: Self-pay | Admitting: Family Medicine

## 2023-07-25 MED ORDER — ACCU-CHEK FASTCLIX LANCETS MISC: 3 refills | Status: DC

## 2023-07-25 MED ORDER — ACCU-CHEK GUIDE TEST VI STRP: ORAL_STRIP | 12 refills | Status: DC

## 2023-07-25 MED ORDER — ACCU-CHEK GUIDE W/DEVICE KIT
PACK | 0 refills | Status: DC
Start: 2023-07-25 — End: 2023-10-01

## 2023-07-25 NOTE — Telephone Encounter (Signed)
 Copied from CRM [id number]. Topic: Clinical - Prescription Issue >> Jul 25, 2023  9:49 AM Star [place] wrote: Reason for CRM: Patient does not have a device to get his blood sugar readings- there is not one listed on his med list  [phone]

## 2023-07-25 NOTE — Telephone Encounter (Signed)
 Testing supplies has been sent to [facility] in [facility]

## 2023-07-28 ENCOUNTER — Other Ambulatory Visit (HOSPITAL_BASED_OUTPATIENT_CLINIC_OR_DEPARTMENT_OTHER): Payer: Self-pay

## 2023-07-29 ENCOUNTER — Other Ambulatory Visit (HOSPITAL_BASED_OUTPATIENT_CLINIC_OR_DEPARTMENT_OTHER): Payer: Self-pay

## 2023-07-29 NOTE — Congregational Nurse Program (Signed)
  Dept: [phone]   Congregational Nurse Program Note  Date of Encounter: 07/29/2023  Client called RN in significant emotional distress, expressing feelings of hopelessness and suicidal ideation. He is feeling hopeless and states "I can't take it anymore, if I knew where a bridge was I would jump off and end it all. I have no one. Nobody is helping me."   Client reports multiple stressors including: lack of transportation and support system, physical disability, recent hospitalization for blood glucose >900, no access to current glucometer or strips, fear of losing shelter bed if he seeks hospital care.  Client demonstrated high emotional dysregulation during call. RN noted statements consistent with SI but no expressed plan or means. RN was able to verbally redirect and de-escalate after therapeutic conversation and acknowledged he does not plan to act on thoughts.   RN encouraged client go to ED immediately for help. However, client refused due to fear of losing shelter placement. RN utilized therapeutic communication, spiritual support, and crisis response strategies to de-escalate client. Safety plan created for client to see RN next Tuesday as we think of creative solutions to get him back to [place]. RN looking into resources in [facility] that are similar to BHRT. RN also gave him resources with insurance to get connected with a care manager and set up transportation. He felt reassured in this moment and stated "you just made me feel a lot better."  RN called [facility] called to assist client in crisis. They state they will follow up with client and follow up. RN will check in with client tomorrow.  Past Medical History: Past Medical History:  Diagnosis Date   Angioedema of lips 07/28/2012   left upper (07/29/2012)   Arthritis    hands and back   Chronic back pain    Cocaine abuse (HCC)    Diabetic neuropathy (HCC)    High cholesterol    Hypertension     Neuropathy    Type II diabetes mellitus (HCC)     Encounter Details:  Community Questionnaire - 07/29/23 1500       Questionnaire   Ask client: Do you give verbal consent for me to treat you today? Yes    Student Assistance N/A    Location Patient Served  [facility]    Encounter Setting Phone/Text/Email    Population Status Unhoused    Insurance Medicaid    Insurance/Financial Assistance Referral N/A    Medication Have Medication Insecurities    Medical Provider Yes    Screening Referrals Made N/A    Medical Referrals Made ED;[facility]    Medical Appointment Completed N/A    CNP Interventions Advocate/Support;Case Management;Educate;Navigate Healthcare System;Counsel;Spiritual Care    Screenings CN Performed N/A    ED Visit Averted N/A    Life-Saving Intervention Made Yes

## 2023-07-30 ENCOUNTER — Other Ambulatory Visit (HOSPITAL_BASED_OUTPATIENT_CLINIC_OR_DEPARTMENT_OTHER): Payer: Self-pay

## 2023-07-31 ENCOUNTER — Other Ambulatory Visit: Payer: Self-pay

## 2023-07-31 ENCOUNTER — Other Ambulatory Visit (HOSPITAL_BASED_OUTPATIENT_CLINIC_OR_DEPARTMENT_OTHER): Payer: Self-pay

## 2023-07-31 MED ORDER — ACCU-CHEK SOFTCLIX LANCETS MISC: 12 refills | Status: DC

## 2023-07-31 NOTE — Congregational Nurse Program (Signed)
 Client called RN. He is frustrated with life and again stating "he is about to do it, I am this close, if no one helps me with getting a house now, I'm going to do it. RN clarifying with client SI and using therapeutic communication and de-escalation techniques. RN attempting to call 911 and client states that is not necessary as he has already gone to hospital twice and they didn't do anything. He states he doesn't want to but doesn't know what to do, he feels isolated from [place] as he is currently at [facility] in [place]. He doesn't have community or support according to client. RN able to redirect client and remind him of plans to help with housing in less than one week. That he is currently housed at shelter and this is a temporary transition. He is in agreeance. RN has given him number for [facility] to talk to in an event of a crisis that number is available 24/7 and to go to ED if he feels he needs help. RN will call his case work at [facility] on Tuesday to discuss plan with client. He states I will not do anything stupid and RN is always able to make him feel better and calm down. Safety plan in place. RN will continue following this client closely.

## 2023-08-01 ENCOUNTER — Other Ambulatory Visit (HOSPITAL_BASED_OUTPATIENT_CLINIC_OR_DEPARTMENT_OTHER): Payer: Self-pay

## 2023-08-05 ENCOUNTER — Other Ambulatory Visit (HOSPITAL_BASED_OUTPATIENT_CLINIC_OR_DEPARTMENT_OTHER): Payer: Self-pay

## 2023-08-05 NOTE — Congregational Nurse Program (Signed)
 Client to RN office for scheduled meeting. He is hopeful and happy that his ID has come in the mail. He needs to follow up with DSS and [employer]. He is in need of an Sport and exercise psychologist card. These barrier prevent him from getting permanent housing. RN is pre-filling out [facility] and [employer] to assist with meeting housing needs. Once client gets birth certificate and SS card, RN will help assist him further with housing.   RN inquired further about mental health and he states that he feels better and is no longer concerned about ending his life. He has no plans and no thoughts. He also states he reached out to [facility] to help during crisis'. RN encouraged client to continue reaching out to them in crisis or go to hospital, especially when RN is unavailable. Applications completed. Client to go get food at [employer] and he has no further concerns for RN. Will continue to follow.

## 2023-08-20 ENCOUNTER — Other Ambulatory Visit (HOSPITAL_BASED_OUTPATIENT_CLINIC_OR_DEPARTMENT_OTHER): Payer: Self-pay

## 2023-09-02 ENCOUNTER — Other Ambulatory Visit: Payer: Self-pay

## 2023-09-04 NOTE — Congregational Nurse Program (Signed)
 RN assisted client by completing a referral for transitions to community living to help him get stable housing. No further questions at this time.

## 2023-09-09 NOTE — Congregational Nurse Program (Signed)
 Client to RN office for follow up on case management referral sent previous day. Arranged screening with [employer] to assist him with housing services with the transitions to community living program. Appointment is on 09/10/23. Client is very appreciative and spoke on his experiences being street housed and the difficulties he is facing. RN also able to offer spiritual care and encouragement for client. He is very appreciative of spiritual care and feels calmer. No further concerns at this time. Will continue to follow.

## 2023-09-14 ENCOUNTER — Encounter (HOSPITAL_COMMUNITY): Payer: Self-pay | Admitting: Emergency Medicine

## 2023-09-14 ENCOUNTER — Emergency Department (HOSPITAL_COMMUNITY)
Admission: EM | Admit: 2023-09-14 | Discharge: 2023-09-15 | Disposition: A | Discharge status: Home / Self Care | Payer: MEDICAID | Attending: Emergency Medicine | Admitting: Emergency Medicine

## 2023-09-14 ENCOUNTER — Other Ambulatory Visit: Payer: Self-pay

## 2023-09-14 DIAGNOSIS — J00 Acute nasopharyngitis [common cold]: Secondary | ICD-10-CM | POA: Diagnosis not present

## 2023-09-14 DIAGNOSIS — Z79899 Other long term (current) drug therapy: Secondary | ICD-10-CM | POA: Diagnosis not present

## 2023-09-14 DIAGNOSIS — I10 Essential (primary) hypertension: Secondary | ICD-10-CM | POA: Insufficient documentation

## 2023-09-14 DIAGNOSIS — R739 Hyperglycemia, unspecified: Secondary | ICD-10-CM

## 2023-09-14 DIAGNOSIS — E1165 Type 2 diabetes mellitus with hyperglycemia: Secondary | ICD-10-CM | POA: Insufficient documentation

## 2023-09-14 DIAGNOSIS — Z794 Long term (current) use of insulin: Secondary | ICD-10-CM | POA: Insufficient documentation

## 2023-09-14 DIAGNOSIS — R42 Dizziness and giddiness: Secondary | ICD-10-CM | POA: Diagnosis not present

## 2023-09-14 DIAGNOSIS — Z7984 Long term (current) use of oral hypoglycemic drugs: Secondary | ICD-10-CM | POA: Insufficient documentation

## 2023-09-14 LAB — COMPREHENSIVE METABOLIC PANEL WITH GFR
ALT: 15 U/L (ref 0–44)
AST: 16 U/L (ref 15–41)
Albumin: 3.6 g/dL (ref 3.5–5.0)
Alkaline Phosphatase: 95 U/L (ref 38–126)
Anion gap: 11 (ref 5–15)
BUN: 10 mg/dL (ref 8–23)
CO2: 24 mmol/L (ref 22–32)
Calcium: 9.7 mg/dL (ref 8.9–10.3)
Chloride: 102 mmol/L (ref 98–111)
Creatinine, Ser: 1.24 mg/dL (ref 0.61–1.24)
GFR, Estimated: >60 mL/min (ref >60)
Glucose, Bld: 362 mg/dL — ABNORMAL HIGH (ref 70–99)
Potassium: 4.1 mmol/L (ref 3.5–5.1)
Sodium: 137 mmol/L (ref 135–145)
Total Bilirubin: 1 mg/dL (ref 0.0–1.2)
Total Protein: 7.5 g/dL (ref 6.5–8.1)

## 2023-09-14 LAB — CBC
HCT: 44.6 % (ref 39.0–52.0)
Hemoglobin: 14.4 g/dL (ref 13.0–17.0)
MCH: 31 pg (ref 26.0–34.0)
MCHC: 32.3 g/dL (ref 30.0–36.0)
MCV: 95.9 fL (ref 80.0–100.0)
Platelets: 366 K/uL (ref 150–400)
RBC: 4.65 MIL/uL (ref 4.22–5.81)
RDW: 13.1 % (ref 11.5–15.5)
WBC: 6.2 K/uL (ref 4.0–10.5)
nRBC: 0 % (ref 0.0–0.2)

## 2023-09-14 LAB — CBG MONITORING, ED: Glucose-Capillary: 332 mg/dL — ABNORMAL HIGH (ref 70–99)

## 2023-09-14 MED ORDER — INSULIN ASPART 100 UNIT/ML IJ SOLN
5.0000 [IU] | Freq: Once | INTRAMUSCULAR | Status: AC
  Administered 2023-09-14: 5 [IU] via SUBCUTANEOUS

## 2023-09-14 MED ORDER — METFORMIN HCL 500 MG PO TABS
1000.0000 mg | ORAL_TABLET | Freq: Once | ORAL | Status: AC
  Administered 2023-09-14: 1000 mg via ORAL
  Filled 2023-09-14: qty 2

## 2023-09-14 MED ORDER — GLIPIZIDE 10 MG PO TABS
10.0000 mg | ORAL_TABLET | Freq: Every day | ORAL | Status: DC
  Filled 2023-09-14: qty 1

## 2023-09-14 MED ORDER — AMLODIPINE BESYLATE 5 MG PO TABS
10.0000 mg | ORAL_TABLET | Freq: Once | ORAL | Status: AC
  Administered 2023-09-14: 10 mg via ORAL
  Filled 2023-09-14: qty 2

## 2023-09-14 NOTE — ED Provider Notes (Signed)
 [place] EMERGENCY DEPARTMENT AT [facility] Provider Note   CSN: [id number] Arrival date & time: 09/14/23  8151     Patient presents with: Hyperglycemia   David Wyatt is a 63 y.o. male who states that he has a primary concern of sensation of cold in the bilateral lower extremities.  He is also concerned of hyperglycemia, states that he has not taken his insulin  the last 2 days, and has a chronic history of dizziness over the for several months but no acute changes.  Medical history includes essential hypertension, GERD, hyperlipidemia, type 2 diabetes, depression.  He denies having any pain in his feet but states that he has difficulty walking.  Uses rollator at baseline.    Hyperglycemia      Prior to Admission medications   Medication Sig Start Date End Date Taking? Authorizing Provider  Accu-Chek FastClix Lancets MISC Use to check blood sugar 3 times daily 07/25/23   Newlin, Enobong, MD  Accu-Chek Softclix Lancets lancets Use as instructed 07/31/23   Newlin, Enobong, MD  amLODipine  (NORVASC ) 10 MG tablet Take 1 tablet (10 mg total) by mouth daily. 07/03/23   Thresa Alan BROCKS, NP  atorvastatin  (LIPITOR) 40 MG tablet Take 1 tablet (40 mg total) by mouth daily. 07/03/23   Brent, Amanda C, NP  Blood Glucose Monitoring Suppl (ACCU-CHEK GUIDE) w/Device KIT Use to test blood sugar 3 times daily 07/25/23   Newlin, Enobong, MD  cyclobenzaprine  (FLEXERIL ) 10 MG tablet Take 1 tablet (10 mg total) by mouth 2 (two) times daily as needed for muscle spasms. 05/28/23   Victoria Ruts, MD  Dulaglutide  (TRULICITY ) 1.5 MG/0.5ML SOAJ Inject 1.5 mg into the skin once a week. 07/05/23   Pamella Ozell LABOR, DO  DULoxetine  (CYMBALTA ) 60 MG capsule Take 1 capsule (60 mg total) by mouth daily. 07/03/23   Brent, Amanda C, NP  gabapentin  (NEURONTIN ) 300 MG capsule Take 1 capsule (300 mg total) by mouth at bedtime. 07/03/23   Brent, Amanda C, NP  glipiZIDE  (GLUCOTROL ) 10 MG tablet Take 1 tablet (10 mg  total) by mouth daily before breakfast. 07/03/23   Thresa Alan C, NP  glucose blood (ACCU-CHEK GUIDE TEST) test strip Use as instructed 07/25/23   Newlin, Enobong, MD  hydrOXYzine  (ATARAX ) 25 MG tablet Take 1 tablet (25 mg total) by mouth 3 (three) times daily as needed for anxiety. 07/03/23   Brent, Amanda C, NP  insulin  aspart (NOVOLOG  FLEXPEN) 100 UNIT/ML FlexPen Inject 0-9 Units into the skin 3 (three) times daily with meals. 07/05/23   Pamella Ozell LABOR, DO  Insulin  Pen Needle (PEN NEEDLES) 32G X 4 MM MISC Use to inject Novolog  three times daily before meals. Must have office visit for refills. 07/23/23   Newlin, Enobong, MD  metFORMIN  (GLUCOPHAGE ) 1000 MG tablet Take 1 tablet (1,000 mg total) by mouth 2 (two) times daily. 07/03/23   Brent, Amanda C, NP  Multiple Vitamin (MULTIVITAMIN WITH MINERALS) TABS tablet Take 1 tablet by mouth daily. 07/03/23   Brent, Amanda C, NP  naproxen  (NAPROSYN ) 250 MG tablet Take 1 tablet (250 mg total) by mouth 2 (two) times daily as needed (Pain). 07/03/23   Brent, Amanda C, NP  traZODone  (DESYREL ) 50 MG tablet Take 1 tablet (50 mg total) by mouth at bedtime. 07/03/23   Thresa Alan BROCKS, NP    Allergies: Lisinopril     Review of Systems  Musculoskeletal:  Positive for arthralgias.  All other systems reviewed and are negative.   Updated Vital Signs  BP (!) 146/90 (BP Location: Right Arm)   Pulse 78   Temp 98.3 F (36.8 C) (Oral)   Resp 17   SpO2 100%   Physical Exam Vitals and nursing note reviewed.  Constitutional:      General: He is not in acute distress.    Appearance: Normal appearance.  HENT:     Head: Normocephalic and atraumatic.     Mouth/Throat:     Mouth: Mucous membranes are moist.     Pharynx: Oropharynx is clear.  Eyes:     Extraocular Movements: Extraocular movements intact.     Conjunctiva/sclera: Conjunctivae normal.     Pupils: Pupils are equal, round, and reactive to light.  Cardiovascular:     Rate and Rhythm: Normal rate and  regular rhythm.     Pulses:          Dorsalis pedis pulses are 1+ on the right side and 1+ on the left side.       Posterior tibial pulses are 1+ on the right side and 1+ on the left side.     Heart sounds: Normal heart sounds. No murmur heard.    No friction rub. No gallop.     Comments: Noted diminished pulses on the dorsalis pedis and posterior tibialis.  Has approximately 3 to 4-second capillary refill bilaterally in the toes. Pulmonary:     Effort: Pulmonary effort is normal.     Breath sounds: Normal breath sounds.  Abdominal:     General: Abdomen is flat. Bowel sounds are normal.     Palpations: Abdomen is soft.  Musculoskeletal:        General: Normal range of motion.     Cervical back: Normal range of motion and neck supple.     Right lower leg: No edema.     Left lower leg: No edema.  Skin:    General: Skin is warm and dry.     Capillary Refill: Capillary refill takes less than 2 seconds.  Neurological:     General: No focal deficit present.     Mental Status: He is alert. Mental status is at baseline.  Psychiatric:        Mood and Affect: Mood normal.     (all labs ordered are listed, but only abnormal results are displayed) Labs Reviewed  COMPREHENSIVE METABOLIC PANEL WITH GFR - Abnormal; Notable for the following components:      Result Value   Glucose, Bld 362 (*)    All other components within normal limits  CBG MONITORING, ED - Abnormal; Notable for the following components:   Glucose-Capillary 332 (*)    All other components within normal limits  CBC  URINALYSIS, ROUTINE W REFLEX MICROSCOPIC  CBG MONITORING, ED  CBG MONITORING, ED    EKG: None  Radiology: No results found.   Procedures   Medications Ordered in the ED - No data to display                                  Medical Decision Making Amount and/or Complexity of Data Reviewed Labs: ordered.   Medical Decision Making:   David Wyatt is a 63 y.o. male who presented to the ED  today with concerns for cold sensation of bilateral feet along with concerns for high blood sugar detailed above.    Patient's presentation is complicated by their history of type 2 diabetes.  Complete initial physical exam performed,  notably the patient  was alert and oriented in no apparent distress.  Notable exam finding is of diminished pulses at the posterior tibialis and at the dorsalis pedis bilaterally..    Reviewed and confirmed nursing documentation for past medical history, family history, social history.    Initial Assessment:   With the patient's presentation of sensation of cold bilateral feet, most likely diagnosis is peripheral arterial disease.   Initial Plan:  Provide patient with food and also provide him with his daily insulin  along with other daily diabetic medications. Provide dose of amlodipine  to cover for missed doses of his prescription antihypertensive. Screening labs including CBC and Metabolic panel to evaluate for infectious or metabolic etiology of disease.  Urinalysis with reflex culture ordered to evaluate for UTI or relevant urologic/nephrologic pathology.  Objective evaluation as below reviewed   Initial Study Results:   Laboratory  All laboratory results reviewed without evidence of clinically relevant pathology.   Exceptions include: Blood sugars noted to be at 362  Reassessment and Plan:   This patient has not had any of his regular diabetic medications in several days, I have provided him with dosing of these medications as noted in his record.  Regarding the cold sensation of feet, pulses are present and he does have intact sensation however suspect these are findings of peripheral arterial disease that will need further evaluation outpatient.  Anticipate that this patient will be stable for discharge with outpatient follow-up for peripheral arterial disease and continued prescription management for his diabetes and hypertension.  Care was transferred to  R.Browning, PA-C.       Final diagnoses:  None    ED Discharge Orders     None          [name] BROCKS, [place] 09/14/23 2353    Franklyn Sid SAILOR, MD 09/15/23 [phone]

## 2023-09-14 NOTE — ED Notes (Signed)
 PT c/o bilateral foot pain since yesterday.  Pain to the bottom of his feet increases with ambulation.

## 2023-09-14 NOTE — ED Triage Notes (Signed)
 Pt from the street via [facility] with reports of hyperglycemia and not feeling well. PT also reports dizziness x months.

## 2023-09-14 NOTE — ED Provider Triage Note (Signed)
 Emergency Medicine Provider Triage Evaluation Note  David Wyatt , a 63 y.o. male  was evaluated in triage.  Pt complains of I do not feel good.  Review of Systems  Positive: Low back pain, bilateral foot pain Negative: Chest pain, shortness of breath  Physical Exam  BP (!) 146/90 (BP Location: Right Arm)   Pulse 78   Temp 98.3 F (36.8 C) (Oral)   Resp 17   SpO2 100%  Gen:   Awake, no distress   Resp:  Normal effort  MSK:   Moves extremities without difficulty   Medical Decision Making  Medically screening exam initiated at 7:42 PM.  Appropriate orders placed.  [name] was informed that the remainder of the evaluation will be completed by another provider, this initial triage assessment does not replace that evaluation, and the importance of remaining in the ED until their evaluation is complete.  Patient arrives via EMS.  He states that he called him because he was not feeling good.  EMS noted hyperglycemia prior to arrival.  Patient stating that he has chronic low back pain that is worse today.  He endorses bilateral foot pain that is worsened with ambulation.   Melvenia Motto, MD 09/14/23 [phone]

## 2023-09-15 ENCOUNTER — Telehealth: Payer: Self-pay

## 2023-09-15 LAB — CBG MONITORING, ED: Glucose-Capillary: 205 mg/dL — ABNORMAL HIGH (ref 70–99)
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
## 2023-09-15 NOTE — Discharge Instructions (Addendum)
 Please follow-up with your regular doctor to ensure that your blood pressures and blood sugar is well-controlled.  He should follow-up with the vascular doctor for your suspected peripheral vascular disease.  It is imperative that you avoid cocaine.

## 2023-09-15 NOTE — ED Provider Notes (Signed)
 Patient reassessed by me, glucose is trending down, blood pressure is also normalizing.  I reassessed the patient's lower extremities, his feet feel warm.  He has dopplerable pulses.  I do think that he would benefit from vascular follow-up.  He is stable and ready for discharge.   Vicky Charleston, PA-C 09/15/23 RICKI Land, April, MD 09/15/23 [phone]
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
## 2023-09-18 ENCOUNTER — Emergency Department (HOSPITAL_COMMUNITY)
Admission: EM | Admit: 2023-09-18 | Discharge: 2023-09-18 | Disposition: A | Discharge status: Home / Self Care | Payer: MEDICAID | Attending: Emergency Medicine | Admitting: Emergency Medicine

## 2023-09-18 ENCOUNTER — Other Ambulatory Visit: Payer: Self-pay

## 2023-09-18 ENCOUNTER — Encounter (HOSPITAL_COMMUNITY): Payer: Self-pay

## 2023-09-18 DIAGNOSIS — Z794 Long term (current) use of insulin: Secondary | ICD-10-CM | POA: Diagnosis not present

## 2023-09-18 DIAGNOSIS — Z79899 Other long term (current) drug therapy: Secondary | ICD-10-CM | POA: Diagnosis not present

## 2023-09-18 DIAGNOSIS — M79604 Pain in right leg: Secondary | ICD-10-CM | POA: Insufficient documentation

## 2023-09-18 DIAGNOSIS — R739 Hyperglycemia, unspecified: Secondary | ICD-10-CM

## 2023-09-18 DIAGNOSIS — I10 Essential (primary) hypertension: Secondary | ICD-10-CM | POA: Diagnosis not present

## 2023-09-18 DIAGNOSIS — M79605 Pain in left leg: Secondary | ICD-10-CM | POA: Diagnosis not present

## 2023-09-18 DIAGNOSIS — Z7984 Long term (current) use of oral hypoglycemic drugs: Secondary | ICD-10-CM | POA: Insufficient documentation

## 2023-09-18 DIAGNOSIS — E1165 Type 2 diabetes mellitus with hyperglycemia: Secondary | ICD-10-CM | POA: Diagnosis not present

## 2023-09-18 DIAGNOSIS — E1140 Type 2 diabetes mellitus with diabetic neuropathy, unspecified: Secondary | ICD-10-CM | POA: Insufficient documentation

## 2023-09-18 LAB — CBG MONITORING, ED
Glucose-Capillary: 137 mg/dL — ABNORMAL HIGH (ref 70–99)
Glucose-Capillary: 132 mg/dL — ABNORMAL HIGH (ref 70–99)
Glucose-Capillary: 41 mg/dL — CL (ref 70–99)

## 2023-09-18 MED ORDER — GABAPENTIN 300 MG PO CAPS
300.0000 mg | ORAL_CAPSULE | Freq: Three times a day (TID) | ORAL | 0 refills | Status: DC
  Filled 2023-09-18: qty 30, 10d supply, fill #0

## 2023-09-18 MED ORDER — GABAPENTIN 300 MG PO CAPS
300.0000 mg | ORAL_CAPSULE | Freq: Once | ORAL | Status: AC
  Administered 2023-09-18: 300 mg via ORAL
  Filled 2023-09-18: qty 1

## 2023-09-18 MED ORDER — ACETAMINOPHEN 500 MG PO TABS
1000.0000 mg | ORAL_TABLET | Freq: Once | ORAL | Status: AC
Start: 2023-09-18 — End: 2023-09-18
  Administered 2023-09-18: 1000 mg via ORAL
  Filled 2023-09-18: qty 2

## 2023-09-18 NOTE — ED Notes (Signed)
 Pt CBG 41 per Nurse Milo L. PA Notified and juice, crackers and peanut butter administered.

## 2023-09-18 NOTE — ED Triage Notes (Signed)
 Pt bibgcems from home with c/o diabetic problems, pain and stiffness in legs after taking insulin .  Bp 220/110 94% ra  98 hr  Cbg 236

## 2023-09-18 NOTE — ED Provider Notes (Signed)
 [place] EMERGENCY DEPARTMENT AT [facility] Provider Note   CSN: [id number] Arrival date & time: 09/18/23  9460     Patient presents with: Blood Sugar Problem   David Wyatt is a 63 y.o. male.   David Wyatt is a 63 y.o. male with history of hypertension, diabetes, neuropathy and frequent use of the ED, who returns to the emergency department via EMS complaining of pain and stiffness in the legs as well as blood sugar problems after taking insulin .  EMS reported a blood sugar of 236 on scene and patient was also hypertensive.  He reports pain in both of his legs that has been present for the past day, patient was seen in the ED yesterday for similar complaints.  Reports he took 10 units of insulin  prior to leaving his home.  Reports constant pain in the lower legs without reported injury or trauma.  No numbness or weakness chest pain.  Reports he has not taken anything for this at home.  No other aggravating or alleviating factors. Pt yelling about pain and unwilling to answer further questions.  The history is provided by the patient and medical records.       Prior to Admission medications   Medication Sig Start Date End Date Taking? Authorizing Provider  gabapentin  (NEURONTIN ) 300 MG capsule Take 1 capsule (300 mg total) by mouth 3 (three) times daily. 09/18/23  Yes Alva Larraine FALCON, PA-C  Accu-Chek FastClix Lancets MISC Use to check blood sugar 3 times daily 07/25/23   Newlin, Enobong, MD  Accu-Chek Softclix Lancets lancets Use as instructed 07/31/23   Newlin, Enobong, MD  amLODipine  (NORVASC ) 10 MG tablet Take 1 tablet (10 mg total) by mouth daily. 07/03/23   Brent, Amanda C, NP  atorvastatin  (LIPITOR) 40 MG tablet Take 1 tablet (40 mg total) by mouth daily. 07/03/23   Brent, Amanda C, NP  Blood Glucose Monitoring Suppl (ACCU-CHEK GUIDE) w/Device KIT Use to test blood sugar 3 times daily 07/25/23   Newlin, Enobong, MD  cyclobenzaprine  (FLEXERIL ) 10 MG tablet Take 1 tablet  (10 mg total) by mouth 2 (two) times daily as needed for muscle spasms. 05/28/23   Victoria Ruts, MD  Dulaglutide  (TRULICITY ) 1.5 MG/0.5ML SOAJ Inject 1.5 mg into the skin once a week. 07/05/23   Pamella Ozell LABOR, DO  DULoxetine  (CYMBALTA ) 60 MG capsule Take 1 capsule (60 mg total) by mouth daily. 07/03/23   Brent, Amanda C, NP  glipiZIDE  (GLUCOTROL ) 10 MG tablet Take 1 tablet (10 mg total) by mouth daily before breakfast. 07/03/23   Thresa Palma C, NP  glucose blood (ACCU-CHEK GUIDE TEST) test strip Use as instructed 07/25/23   Newlin, Enobong, MD  hydrOXYzine  (ATARAX ) 25 MG tablet Take 1 tablet (25 mg total) by mouth 3 (three) times daily as needed for anxiety. 07/03/23   Brent, Amanda C, NP  insulin  aspart (NOVOLOG  FLEXPEN) 100 UNIT/ML FlexPen Inject 0-9 Units into the skin 3 (three) times daily with meals. 07/05/23   Pamella Ozell LABOR, DO  Insulin  Pen Needle (PEN NEEDLES) 32G X 4 MM MISC Use to inject Novolog  three times daily before meals. Must have office visit for refills. 07/23/23   Newlin, Enobong, MD  metFORMIN  (GLUCOPHAGE ) 1000 MG tablet Take 1 tablet (1,000 mg total) by mouth 2 (two) times daily. 07/03/23   Brent, Amanda C, NP  Multiple Vitamin (MULTIVITAMIN WITH MINERALS) TABS tablet Take 1 tablet by mouth daily. 07/03/23   Brent, Amanda C, NP  naproxen  (NAPROSYN ) 250 MG  tablet Take 1 tablet (250 mg total) by mouth 2 (two) times daily as needed (Pain). 07/03/23   Brent, Amanda C, NP  traZODone  (DESYREL ) 50 MG tablet Take 1 tablet (50 mg total) by mouth at bedtime. 07/03/23   Thresa Alan BROCKS, NP    Allergies: Lisinopril     Review of Systems  Constitutional:  Negative for chills and fever.  Musculoskeletal:  Positive for myalgias.       Bilateral leg pain    Updated Vital Signs BP (!) 139/114 (BP Location: Right Arm)   Pulse 92   Temp 97.9 F (36.6 C) (Tympanic)   Resp 18   Ht 5' 8 (1.727 m)   Wt 68 kg   SpO2 98%   BMI 22.81 kg/m   Physical Exam Vitals and nursing note  reviewed.  Constitutional:      General: He is not in acute distress.    Appearance: Normal appearance. He is well-developed. He is not ill-appearing or diaphoretic.  HENT:     Head: Normocephalic and atraumatic.  Eyes:     General:        Right eye: No discharge.        Left eye: No discharge.  Cardiovascular:     Rate and Rhythm: Normal rate and regular rhythm.  Pulmonary:     Effort: Pulmonary effort is normal. No respiratory distress.  Musculoskeletal:     Right lower leg: No edema.     Left lower leg: No edema.     Comments: Bilateral lower extremities warm and well-perfused with 2+ DP and PT pulses, no edema noted, no erythema or skin changes  Neurological:     Mental Status: He is alert and oriented to person, place, and time.     Coordination: Coordination normal.  Psychiatric:        Mood and Affect: Mood normal.        Behavior: Behavior normal.     (all labs ordered are listed, but only abnormal results are displayed) Labs Reviewed  CBG MONITORING, ED - Abnormal; Notable for the following components:      Result Value   Glucose-Capillary 137 (*)    All other components within normal limits  CBG MONITORING, ED - Abnormal; Notable for the following components:   Glucose-Capillary 41 (*)    All other components within normal limits  CBG MONITORING, ED - Abnormal; Notable for the following components:   Glucose-Capillary 132 (*)    All other components within normal limits    EKG: None  Radiology: No results found.   Procedures   Medications Ordered in the ED  acetaminophen  (TYLENOL ) tablet 1,000 mg (has no administration in time range)  gabapentin  (NEURONTIN ) capsule 300 mg (has no administration in time range)                                    Medical Decision Making  63 year old male presents with hyperglycemia and pain in bilateral lower extremities, seen for the same yesterday without acute findings, bilateral lower extremities are warm and  well-perfused no traumatic injury or deformity present.  Suspect this is due to underlying neuropathy, which patient has had a prior diagnosis of, has been previously prescribed medications for this which he is not taking currently.   Patient administered 10 units of insulin  prior to coming to the emergency department, CBG 137 initially on arrival, on recheck it has dropped to  41, despite this patient is awake and alert, given juice and crackers and CBG improved to 132.  Patient ambulatory in the department at baseline, pain improved with gabapentin  will prescribe for patient to use at home as he says he no longer has this medication.  Encourage close follow-up with primary care and return precautions discussed.  Discharged in good condition.     Final diagnoses:  Pain in both lower extremities  Hyperglycemia    ED Discharge Orders          Ordered    gabapentin  (NEURONTIN ) 300 MG capsule  3 times daily        09/18/23 0839               Alva Larraine FALCON, PA-C 09/18/23 9071    [name], MD 09/18/23 [phone]

## 2023-09-18 NOTE — ED Notes (Signed)
 Pt given sandwich and diet Ginger Ale

## 2023-09-18 NOTE — Discharge Instructions (Signed)
 I suspect your pain in your legs is likely due to nerve damage which can occur due to issues in blood flow as well as diabetes.  To try and help control pain you can take prescribed gabapentin  3 times daily.  Please follow-up closely with your primary care doctor regarding this pain.

## 2023-09-26 ENCOUNTER — Ambulatory Visit: Payer: Self-pay
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
## 2023-09-27 ENCOUNTER — Other Ambulatory Visit: Payer: Self-pay

## 2023-09-27 ENCOUNTER — Encounter (HOSPITAL_COMMUNITY): Payer: Self-pay | Admitting: Internal Medicine

## 2023-09-27 ENCOUNTER — Emergency Department (HOSPITAL_COMMUNITY): Payer: MEDICAID

## 2023-09-27 ENCOUNTER — Inpatient Hospital Stay (HOSPITAL_COMMUNITY)
Admission: EM | Admit: 2023-09-27 | Discharge: 2023-10-01 | DRG: 638 | Disposition: A | Discharge status: Home / Self Care | Payer: MEDICAID | Attending: Internal Medicine | Admitting: Internal Medicine

## 2023-09-27 DIAGNOSIS — Z7985 Long-term (current) use of injectable non-insulin antidiabetic drugs: Secondary | ICD-10-CM

## 2023-09-27 DIAGNOSIS — Z7984 Long term (current) use of oral hypoglycemic drugs: Secondary | ICD-10-CM | POA: Diagnosis not present

## 2023-09-27 DIAGNOSIS — Z888 Allergy status to other drugs, medicaments and biological substances status: Secondary | ICD-10-CM | POA: Diagnosis not present

## 2023-09-27 DIAGNOSIS — Z79899 Other long term (current) drug therapy: Secondary | ICD-10-CM | POA: Diagnosis not present

## 2023-09-27 DIAGNOSIS — E860 Dehydration: Secondary | ICD-10-CM | POA: Diagnosis present

## 2023-09-27 DIAGNOSIS — N179 Acute kidney failure, unspecified: Secondary | ICD-10-CM | POA: Diagnosis present

## 2023-09-27 DIAGNOSIS — I10 Essential (primary) hypertension: Secondary | ICD-10-CM | POA: Diagnosis present

## 2023-09-27 DIAGNOSIS — Z5900 Homelessness unspecified: Secondary | ICD-10-CM | POA: Diagnosis not present

## 2023-09-27 DIAGNOSIS — E872 Acidosis, unspecified: Secondary | ICD-10-CM | POA: Diagnosis present

## 2023-09-27 DIAGNOSIS — Z833 Family history of diabetes mellitus: Secondary | ICD-10-CM

## 2023-09-27 DIAGNOSIS — R739 Hyperglycemia, unspecified: Secondary | ICD-10-CM | POA: Diagnosis present

## 2023-09-27 DIAGNOSIS — M79604 Pain in right leg: Secondary | ICD-10-CM | POA: Diagnosis present

## 2023-09-27 DIAGNOSIS — E7800 Pure hypercholesterolemia, unspecified: Secondary | ICD-10-CM | POA: Diagnosis present

## 2023-09-27 DIAGNOSIS — Z841 Family history of disorders of kidney and ureter: Secondary | ICD-10-CM | POA: Diagnosis not present

## 2023-09-27 DIAGNOSIS — F17210 Nicotine dependence, cigarettes, uncomplicated: Secondary | ICD-10-CM | POA: Diagnosis present

## 2023-09-27 DIAGNOSIS — E1165 Type 2 diabetes mellitus with hyperglycemia: Principal | ICD-10-CM | POA: Diagnosis present

## 2023-09-27 DIAGNOSIS — Z83438 Family history of other disorder of lipoprotein metabolism and other lipidemia: Secondary | ICD-10-CM | POA: Diagnosis not present

## 2023-09-27 DIAGNOSIS — R202 Paresthesia of skin: Secondary | ICD-10-CM | POA: Diagnosis not present

## 2023-09-27 DIAGNOSIS — E1140 Type 2 diabetes mellitus with diabetic neuropathy, unspecified: Secondary | ICD-10-CM | POA: Diagnosis present

## 2023-09-27 DIAGNOSIS — Z794 Long term (current) use of insulin: Secondary | ICD-10-CM | POA: Diagnosis not present

## 2023-09-27 DIAGNOSIS — E1151 Type 2 diabetes mellitus with diabetic peripheral angiopathy without gangrene: Secondary | ICD-10-CM | POA: Diagnosis present

## 2023-09-27 DIAGNOSIS — Z91148 Patient's other noncompliance with medication regimen for other reason: Secondary | ICD-10-CM | POA: Diagnosis not present

## 2023-09-27 LAB — BASIC METABOLIC PANEL WITH GFR
Anion gap: 16 — ABNORMAL HIGH (ref 5–15)
Anion gap: 14 (ref 5–15)
Anion gap: 12 (ref 5–15)
BUN: 29 mg/dL — ABNORMAL HIGH (ref 8–23)
BUN: 26 mg/dL — ABNORMAL HIGH (ref 8–23)
BUN: 20 mg/dL (ref 8–23)
CO2: 22 mmol/L (ref 22–32)
CO2: 22 mmol/L (ref 22–32)
CO2: 24 mmol/L (ref 22–32)
Calcium: 9.6 mg/dL (ref 8.9–10.3)
Calcium: 8.9 mg/dL (ref 8.9–10.3)
Calcium: 9 mg/dL (ref 8.9–10.3)
Chloride: 87 mmol/L — ABNORMAL LOW (ref 98–111)
Chloride: 96 mmol/L — ABNORMAL LOW (ref 98–111)
Chloride: 101 mmol/L (ref 98–111)
Creatinine, Ser: 1.96 mg/dL — ABNORMAL HIGH (ref 0.61–1.24)
Creatinine, Ser: 1.73 mg/dL — ABNORMAL HIGH (ref 0.61–1.24)
Creatinine, Ser: 1.07 mg/dL (ref 0.61–1.24)
GFR, Estimated: 38 mL/min — ABNORMAL LOW (ref >60)
GFR, Estimated: 44 mL/min — ABNORMAL LOW (ref >60)
GFR, Estimated: >60 mL/min (ref >60)
Glucose, Bld: 764 mg/dL (ref 70–99)
Glucose, Bld: 481 mg/dL — ABNORMAL HIGH (ref 70–99)
Glucose, Bld: 143 mg/dL — ABNORMAL HIGH (ref 70–99)
Potassium: 4.6 mmol/L (ref 3.5–5.1)
Potassium: 4.1 mmol/L (ref 3.5–5.1)
Potassium: 3.5 mmol/L (ref 3.5–5.1)
Sodium: 125 mmol/L — ABNORMAL LOW (ref 135–145)
Sodium: 132 mmol/L — ABNORMAL LOW (ref 135–145)
Sodium: 137 mmol/L (ref 135–145)

## 2023-09-27 LAB — URINALYSIS, ROUTINE W REFLEX MICROSCOPIC
Bacteria, UA: NONE SEEN
Bilirubin Urine: NEGATIVE
Glucose, UA: >=500 mg/dL — AB
Hgb urine dipstick: NEGATIVE
Ketones, ur: NEGATIVE mg/dL
Leukocytes,Ua: NEGATIVE
Nitrite: NEGATIVE
Protein, ur: NEGATIVE mg/dL
Specific Gravity, Urine: 1.026 (ref 1.005–1.030)
pH: 5 (ref 5.0–8.0)

## 2023-09-27 LAB — BLOOD GAS, VENOUS
Acid-Base Excess: 0.7 mmol/L (ref 0.0–2.0)
Bicarbonate: 26.6 mmol/L (ref 20.0–28.0)
O2 Saturation: 77.4 %
Patient temperature: 37
pCO2, Ven: 46 mmHg (ref 44–60)
pH, Ven: 7.37 (ref 7.25–7.43)
pO2, Ven: 41 mmHg (ref 32–45)

## 2023-09-27 LAB — BETA-HYDROXYBUTYRIC ACID
Beta-Hydroxybutyric Acid: 0.63 mmol/L — ABNORMAL HIGH (ref 0.05–0.27)
Beta-Hydroxybutyric Acid: 0.13 mmol/L (ref 0.05–0.27)

## 2023-09-27 LAB — CBC
HCT: 47.4 % (ref 39.0–52.0)
Hemoglobin: 15.7 g/dL (ref 13.0–17.0)
MCH: 30.7 pg (ref 26.0–34.0)
MCHC: 33.1 g/dL (ref 30.0–36.0)
MCV: 92.8 fL (ref 80.0–100.0)
Platelets: 374 K/uL (ref 150–400)
RBC: 5.11 MIL/uL (ref 4.22–5.81)
RDW: 12.6 % (ref 11.5–15.5)
WBC: 7.3 K/uL (ref 4.0–10.5)
nRBC: 0 % (ref 0.0–0.2)

## 2023-09-27 LAB — GLUCOSE, CAPILLARY
Glucose-Capillary: 131 mg/dL — ABNORMAL HIGH (ref 70–99)
Glucose-Capillary: 142 mg/dL — ABNORMAL HIGH (ref 70–99)
Glucose-Capillary: 144 mg/dL — ABNORMAL HIGH (ref 70–99)
Glucose-Capillary: 194 mg/dL — ABNORMAL HIGH (ref 70–99)
Glucose-Capillary: 239 mg/dL — ABNORMAL HIGH (ref 70–99)
Glucose-Capillary: 275 mg/dL — ABNORMAL HIGH (ref 70–99)
Glucose-Capillary: 276 mg/dL — ABNORMAL HIGH (ref 70–99)
Glucose-Capillary: 298 mg/dL — ABNORMAL HIGH (ref 70–99)

## 2023-09-27 LAB — MRSA NEXT GEN BY PCR, NASAL: MRSA by PCR Next Gen: NOT DETECTED

## 2023-09-27 LAB — CBG MONITORING, ED
Glucose-Capillary: >600 mg/dL (ref 70–99)
Glucose-Capillary: >600 mg/dL (ref 70–99)
Glucose-Capillary: 449 mg/dL — ABNORMAL HIGH (ref 70–99)

## 2023-09-27 LAB — HIV ANTIBODY (ROUTINE TESTING W REFLEX): HIV Screen 4th Generation wRfx: NONREACTIVE

## 2023-09-27 LAB — HEMOGLOBIN A1C
Hgb A1c MFr Bld: 13.9 % — ABNORMAL HIGH (ref 4.8–5.6)
Mean Plasma Glucose: 352.23 mg/dL

## 2023-09-27 MED ORDER — AMLODIPINE BESYLATE 10 MG PO TABS
10.0000 mg | ORAL_TABLET | Freq: Every day | ORAL | Status: DC
  Administered 2023-09-27 – 2023-10-01 (×5): 10 mg via ORAL
  Filled 2023-09-27: qty 2
  Filled 2023-09-27 (×4): qty 1

## 2023-09-27 MED ORDER — INSULIN REGULAR(HUMAN) IN NACL 100-0.9 UT/100ML-% IV SOLN
INTRAVENOUS | Status: DC
  Administered 2023-09-27: 7 [IU]/h via INTRAVENOUS
  Filled 2023-09-27: qty 100

## 2023-09-27 MED ORDER — HEPARIN SODIUM (PORCINE) 5000 UNIT/ML IJ SOLN
5000.0000 [IU] | Freq: Three times a day (TID) | INTRAMUSCULAR | Status: DC
  Administered 2023-09-27 – 2023-10-01 (×11): 5000 [IU] via SUBCUTANEOUS
  Filled 2023-09-27 (×11): qty 1

## 2023-09-27 MED ORDER — TRAZODONE HCL 50 MG PO TABS
25.0000 mg | ORAL_TABLET | Freq: Every evening | ORAL | Status: DC | PRN
  Administered 2023-09-27 – 2023-09-30 (×2): 25 mg via ORAL
  Filled 2023-09-27 (×2): qty 1

## 2023-09-27 MED ORDER — ACETAMINOPHEN 325 MG PO TABS
650.0000 mg | ORAL_TABLET | Freq: Four times a day (QID) | ORAL | Status: DC | PRN
  Administered 2023-09-27: 650 mg via ORAL
  Filled 2023-09-27 (×2): qty 2

## 2023-09-27 MED ORDER — CYCLOBENZAPRINE HCL 10 MG PO TABS
10.0000 mg | ORAL_TABLET | Freq: Two times a day (BID) | ORAL | Status: DC | PRN
  Administered 2023-09-27 (×2): 10 mg via ORAL
  Filled 2023-09-27 (×2): qty 1

## 2023-09-27 MED ORDER — LACTATED RINGERS IV SOLN: INTRAVENOUS | Status: DC

## 2023-09-27 MED ORDER — ONDANSETRON HCL 4 MG PO TABS: 4.0000 mg | ORAL_TABLET | Freq: Four times a day (QID) | ORAL | Status: DC | PRN

## 2023-09-27 MED ORDER — SODIUM CHLORIDE 0.9 % IV SOLN: INTRAVENOUS | Status: AC

## 2023-09-27 MED ORDER — DEXTROSE 50 % IV SOLN: 0.0000 mL | INTRAVENOUS | Status: DC | PRN

## 2023-09-27 MED ORDER — ORAL CARE MOUTH RINSE: 15.0000 mL | OROMUCOSAL | Status: DC | PRN

## 2023-09-27 MED ORDER — ONDANSETRON HCL 4 MG/2ML IJ SOLN: 4.0000 mg | Freq: Four times a day (QID) | INTRAMUSCULAR | Status: DC | PRN

## 2023-09-27 MED ORDER — ACETAMINOPHEN 650 MG RE SUPP: 650.0000 mg | Freq: Four times a day (QID) | RECTAL | Status: DC | PRN

## 2023-09-27 MED ORDER — DEXTROSE IN LACTATED RINGERS 5 % IV SOLN: INTRAVENOUS | Status: DC

## 2023-09-27 MED ORDER — ALBUTEROL SULFATE (2.5 MG/3ML) 0.083% IN NEBU: 2.5000 mg | INHALATION_SOLUTION | RESPIRATORY_TRACT | Status: DC | PRN

## 2023-09-27 MED ORDER — SODIUM CHLORIDE 0.9 % IV BOLUS
2000.0000 mL | Freq: Once | INTRAVENOUS | Status: AC
  Administered 2023-09-27: 2000 mL via INTRAVENOUS

## 2023-09-27 MED ORDER — SODIUM CHLORIDE 0.9 % IV SOLN: INTRAVENOUS | Status: DC

## 2023-09-27 MED ORDER — GABAPENTIN 100 MG PO CAPS
200.0000 mg | ORAL_CAPSULE | Freq: Three times a day (TID) | ORAL | Status: DC
  Administered 2023-09-27 – 2023-10-01 (×12): 200 mg via ORAL
  Filled 2023-09-27 (×12): qty 2

## 2023-09-27 NOTE — H&P (Signed)
 History and Physical  David Wyatt FMW:[id number] DOB: [birth year] DOA: 09/27/2023  PCP: Delbert Clam, MD   Chief Complaint: R arm and leg tingling   HPI: David Wyatt is a 63 y.o. male with medical history significant for homelessness, chronic right arm and leg tingling being admitted to the hospital with hyperglycemia and associated AKI.  Patient states he is homeless, unable to keep his medications with him and has not been taking any medications for several days.  States that he has chronic right arm and leg tingling for about a month, there has been no change, he decided to come to the ER to have it checked out.  He has had some polyuria and polydipsia, denies any fevers, headaches, nausea, vomiting or any other concerns.  Review of Systems: Please see HPI for pertinent positives and negatives. A complete 10 system review of systems are otherwise negative.  Past Medical History:  Diagnosis Date   Angioedema of lips 07/28/2012   left upper (07/29/2012)   Arthritis    hands and back   Chronic back pain    Cocaine abuse (HCC)    Diabetic neuropathy (HCC)    High cholesterol    Hypertension    Neuropathy    Type II diabetes mellitus (HCC)    Past Surgical History:  Procedure Laterality Date   BACK SURGERY     CYSTOSCOPY W/ URETERAL STENT PLACEMENT Right 02/23/2021   Procedure: CYSTOSCOPY WITH RETROGRADE PYELOGRAM/URETERAL STENT PLACEMENT;  Surgeon: Selma Donnice SAUNDERS, MD;  Location: [facility];  Service: Urology;  Laterality: Right;   CYSTOSCOPY/URETEROSCOPY/HOLMIUM LASER/STENT PLACEMENT Right 12/05/2022   Procedure: CYSTOSCOPY, RIGHT URETEROSCOPY, AND RIGHT URETERAL STENT PLACEMENT;  Surgeon: Shane Steffan BROCKS, MD;  Location: [facility];  Service: Urology;  Laterality: Right;  90 MINUTES   HERNIA REPAIR Right 04/01/2012   I & D EXTREMITY Left 12/13/2012   Procedure: IRRIGATION AND DEBRIDEMENT LEFT THUMB;  Surgeon: Franky SAUNDERS Curia, MD;  Location: [facility] OR;  Service: Orthopedics;  Laterality: Left;    INCISION AND DRAINAGE OF WOUND     boil on back/notes 07/14/2008 (07/29/2012)   INGUINAL HERNIA REPAIR  04/01/2012   Procedure: HERNIA REPAIR INGUINAL ADULT;  Surgeon: Sherlean JINNY Laughter, MD;  Location: [facility] OR;  Service: General;  Laterality: Right;   INGUINAL HERNIA REPAIR Left 09/06/2016   Procedure: OPEN REPAIR LEFT INGUINAL HERNIA;  Surgeon: Tanda Locus, MD;  Location: [facility] OR;  Service: General;  Laterality: Left;   INSERTION OF MESH Left 09/06/2016   Procedure: INSERTION OF MESH;  Surgeon: Tanda Locus, MD;  Location: [facility] OR;  Service: General;  Laterality: Left;   TONSILLECTOMY     Social History:  reports that he has been smoking cigarettes. He has a 7.5 pack-year smoking history. He quit smokeless tobacco use about 8 years ago. He reports current drug use. Drug: Crack cocaine. He reports that he does not drink alcohol.  Allergies  Allergen Reactions   Lisinopril  Other (See Comments) and Swelling    Lip Swelling, angioedema  angioedema  Lip Swelling  Lip Swelling, angioedema    Family History  Problem Relation Age of Onset   Diabetes Mother    Kidney disease Mother    Hyperlipidemia Mother    Hyperlipidemia Father    Diabetes Father      Prior to Admission medications   Medication Sig Start Date End Date Taking? Authorizing Provider  Accu-Chek FastClix Lancets MISC Use to check blood sugar 3 times daily 07/25/23   Newlin, Enobong, MD  Accu-Chek Softclix Lancets lancets Use as instructed 07/31/23   Newlin, Enobong, MD  amLODipine  (NORVASC ) 10 MG tablet Take 1 tablet (10 mg total) by mouth daily. 07/03/23   Thresa Alan BROCKS, NP  atorvastatin  (LIPITOR) 40 MG tablet Take 1 tablet (40 mg total) by mouth daily. 07/03/23   Brent, Amanda C, NP  Blood Glucose Monitoring Suppl (ACCU-CHEK GUIDE) w/Device KIT Use to test blood sugar 3 times daily 07/25/23   Newlin, Enobong, MD  cyclobenzaprine  (FLEXERIL ) 10 MG tablet Take 1 tablet (10 mg total) by mouth 2 (two) times daily as needed for  muscle spasms. 05/28/23   Victoria Ruts, MD  Dulaglutide  (TRULICITY ) 1.5 MG/0.5ML SOAJ Inject 1.5 mg into the skin once a week. 07/05/23   Pamella Ozell LABOR, DO  DULoxetine  (CYMBALTA ) 60 MG capsule Take 1 capsule (60 mg total) by mouth daily. 07/03/23   Brent, Amanda C, NP  gabapentin  (NEURONTIN ) 300 MG capsule Take 1 capsule (300 mg total) by mouth 3 (three) times daily. 09/18/23   Kehrli, Kelsey F, PA-C  glipiZIDE  (GLUCOTROL ) 10 MG tablet Take 1 tablet (10 mg total) by mouth daily before breakfast. 07/03/23   Thresa Alan C, NP  glucose blood (ACCU-CHEK GUIDE TEST) test strip Use as instructed 07/25/23   Newlin, Enobong, MD  hydrOXYzine  (ATARAX ) 25 MG tablet Take 1 tablet (25 mg total) by mouth 3 (three) times daily as needed for anxiety. 07/03/23   Brent, Amanda C, NP  insulin  aspart (NOVOLOG  FLEXPEN) 100 UNIT/ML FlexPen Inject 0-9 Units into the skin 3 (three) times daily with meals. 07/05/23   Pamella Ozell LABOR, DO  Insulin  Pen Needle (PEN NEEDLES) 32G X 4 MM MISC Use to inject Novolog  three times daily before meals. Must have office visit for refills. 07/23/23   Newlin, Enobong, MD  metFORMIN  (GLUCOPHAGE ) 1000 MG tablet Take 1 tablet (1,000 mg total) by mouth 2 (two) times daily. 07/03/23   Brent, Amanda C, NP  Multiple Vitamin (MULTIVITAMIN WITH MINERALS) TABS tablet Take 1 tablet by mouth daily. 07/03/23   Brent, Amanda C, NP  naproxen  (NAPROSYN ) 250 MG tablet Take 1 tablet (250 mg total) by mouth 2 (two) times daily as needed (Pain). 07/03/23   Thresa Alan BROCKS, NP  traZODone  (DESYREL ) 50 MG tablet Take 1 tablet (50 mg total) by mouth at bedtime. 07/03/23   Thresa Alan BROCKS, NP    Physical Exam: BP (!) 132/94 (BP Location: Right Arm)   Pulse 99   Temp 98.1 F (36.7 C) (Oral)   Resp 18   SpO2 95%  General:  Alert, oriented, calm, in no acute distress  Eyes: EOMI, clear conjuctivae, white sclerea Neck: supple, no masses, trachea mildline  Cardiovascular: RRR, no murmurs or rubs, no peripheral  edema  Respiratory: clear to auscultation bilaterally, no wheezes, no crackles  Abdomen: soft, nontender, nondistended, normal bowel tones heard  Skin: dry, no rashes  Musculoskeletal: no joint effusions, normal range of motion  Psychiatric: appropriate affect, normal speech  Neurologic: extraocular muscles intact, clear speech, moving all extremities with intact sensorium         Labs on Admission:  Basic Metabolic Panel: Recent Labs  Lab 09/27/23 1044  NA 125*  K 4.6  CL 87*  CO2 22  GLUCOSE 764*  BUN 29*  CREATININE 1.96*  CALCIUM  9.6   Liver Function Tests: No results for input(s): AST, ALT, ALKPHOS, BILITOT, PROT, ALBUMIN in the last 168 hours. No results for input(s): LIPASE, AMYLASE in the last 168 hours. No  results for input(s): AMMONIA in the last 168 hours. CBC: Recent Labs  Lab 09/27/23 1043  WBC 7.3  HGB 15.7  HCT 47.4  MCV 92.8  PLT 374   Cardiac Enzymes: No results for input(s): CKTOTAL, CKMB, CKMBINDEX, TROPONINI in the last 168 hours. BNP (last 3 results) No results for input(s): BNP in the last 8760 hours.  ProBNP (last 3 results) No results for input(s): PROBNP in the last 8760 hours.  CBG: Recent Labs  Lab 09/27/23 1015 09/27/23 1027  GLUCAP >600* >600*    Radiological Exams on Admission: CT Head Wo Contrast Result Date: 09/27/2023 CLINICAL DATA:  63 year old male with headache, right extremity tingling. EXAM: CT HEAD WITHOUT CONTRAST TECHNIQUE: Contiguous axial images were obtained from the base of the skull through the vertex without intravenous contrast. RADIATION DOSE REDUCTION: This exam was performed according to the departmental dose-optimization program which includes automated exposure control, adjustment of the mA and/or kV according to patient size and/or use of iterative reconstruction technique. COMPARISON:  Brain MRI 05/06/2017.  Head CT 07/12/2022. FINDINGS: Brain: Stable cerebral volume. No midline  shift, ventriculomegaly, mass effect, evidence of mass lesion, intracranial hemorrhage or evidence of cortically based acute infarction. Patchy and confluent bilateral cerebral white matter hypodensity. Some deep white matter capsule involvement. Mild heterogeneity in the deep gray nuclei. Subtle chronic left cerebellar infarct(s). Vascular: No suspicious intracranial vascular hyperdensity. Calcified atherosclerosis at the skull base. Skull: Intact.  No acute osseous abnormality identified. Sinuses/Orbits: Visualized paranasal sinuses and mastoids are clear. Other: Visualized orbits and scalp soft tissues are within normal limits. IMPRESSION: 1. No acute intracranial abnormality. 2. Stable non contrast CT appearance of moderately advanced chronic small vessel disease. Electronically Signed   By: VEAR Hurst M.D.   On: 09/27/2023 11:15   Assessment/Plan David Wyatt is a 63 y.o. male with medical history significant for homelessness, chronic right arm and leg tingling being admitted to the hospital with hyperglycemia and associated AKI.   Hyperglycemia-due to medication noncompliance, without evidence of diabetic ketoacidosis.  No evidence of acute infection. -Inpatient admission -Clear liquid diet -IV fluids -IV insulin  drip -Follow every 8 hour BMP  Pseudohyponatremia-due to severe hyperglycemia -Follow every 8 hour BMP -NaCl infusion  Acute kidney injury-due to relative dehydration from significant hyperglycemia -Hold nephrotoxins -Renally dose medications -IV fluids -Recheck renal function in the morning  Type 2 diabetes-last hemoglobin A1c 8.3 in March 2025 -Update A1c -Carb modified diet when eating -Continue insulin  drip, anticipate basal bolus insulin  dosing once off insulin  drip  Hypertension-continue home amlodipine   Peripheral neuropathy-continue prescribed gabapentin  at reduced dose due to AKI  DVT prophylaxis: Subcutaneous heparin     Code Status: Full Code  Consults  called: None  Admission status: The appropriate patient status for this patient is INPATIENT. Inpatient status is judged to be reasonable and necessary in order to provide the required intensity of service to ensure the patient's safety. The patient's presenting symptoms, physical exam findings, and initial radiographic and laboratory data in the context of their chronic comorbidities is felt to place them at high risk for further clinical deterioration. Furthermore, it is not anticipated that the patient will be medically stable for discharge from the hospital within 2 midnights of admission.    I certify that at the point of admission it is my clinical judgment that the patient will require inpatient hospital care spanning beyond 2 midnights from the point of admission due to high intensity of service, high risk for further deterioration and high frequency  of surveillance required  Time spent: 48 minutes  Enid Maultsby CHRISTELLA Gail MD [facility] Pager [phone]  If 7PM-7AM, please contact night-coverage [email or web address] Password [email or web address]  09/27/2023, 1:11 PM

## 2023-09-27 NOTE — ED Triage Notes (Addendum)
 Pt reports right arm and leg tingling the last few days. Feels like his leg will give out but other leg does also. Grip strength equal. Also feels like his memory is bad. Has had issues with his meds lately d/t housing

## 2023-09-27 NOTE — Plan of Care (Signed)
   Problem: Education: Goal: Knowledge of General Education information will improve Description Including pain rating scale, medication(s)/side effects and non-pharmacologic comfort measures Outcome: Progressing

## 2023-09-27 NOTE — ED Provider Notes (Signed)
 [place] EMERGENCY DEPARTMENT AT [facility] Provider Note   CSN: [id number] Arrival date & time: 09/27/23  9040     Patient presents with: Tingling   David Wyatt is a 63 y.o. male.   63 year old male presents with hyperglycemia.  Patient states been out of his diabetic medications for several days.  Patient is currently homeless.  Notes chronic right arm and right leg tingling for about a month.  Denies any new neurological features.  No headaches.  Has had polyuria and polydipsia.  Denies any vomiting.       Prior to Admission medications   Medication Sig Start Date End Date Taking? Authorizing Provider  Accu-Chek FastClix Lancets MISC Use to check blood sugar 3 times daily 07/25/23   Newlin, Enobong, MD  Accu-Chek Softclix Lancets lancets Use as instructed 07/31/23   Newlin, Enobong, MD  amLODipine  (NORVASC ) 10 MG tablet Take 1 tablet (10 mg total) by mouth daily. 07/03/23   Brent, Amanda C, NP  atorvastatin  (LIPITOR) 40 MG tablet Take 1 tablet (40 mg total) by mouth daily. 07/03/23   Brent, Amanda C, NP  Blood Glucose Monitoring Suppl (ACCU-CHEK GUIDE) w/Device KIT Use to test blood sugar 3 times daily 07/25/23   Newlin, Enobong, MD  cyclobenzaprine  (FLEXERIL ) 10 MG tablet Take 1 tablet (10 mg total) by mouth 2 (two) times daily as needed for muscle spasms. 05/28/23   Victoria Ruts, MD  Dulaglutide  (TRULICITY ) 1.5 MG/0.5ML SOAJ Inject 1.5 mg into the skin once a week. 07/05/23   Pamella Ozell LABOR, DO  DULoxetine  (CYMBALTA ) 60 MG capsule Take 1 capsule (60 mg total) by mouth daily. 07/03/23   Thresa Alan BROCKS, NP  gabapentin  (NEURONTIN ) 300 MG capsule Take 1 capsule (300 mg total) by mouth 3 (three) times daily. 09/18/23   Kehrli, Kelsey F, PA-C  glipiZIDE  (GLUCOTROL ) 10 MG tablet Take 1 tablet (10 mg total) by mouth daily before breakfast. 07/03/23   Thresa Alan C, NP  glucose blood (ACCU-CHEK GUIDE TEST) test strip Use as instructed 07/25/23   Newlin, Enobong, MD   hydrOXYzine  (ATARAX ) 25 MG tablet Take 1 tablet (25 mg total) by mouth 3 (three) times daily as needed for anxiety. 07/03/23   Brent, Amanda C, NP  insulin  aspart (NOVOLOG  FLEXPEN) 100 UNIT/ML FlexPen Inject 0-9 Units into the skin 3 (three) times daily with meals. 07/05/23   Pamella Ozell LABOR, DO  Insulin  Pen Needle (PEN NEEDLES) 32G X 4 MM MISC Use to inject Novolog  three times daily before meals. Must have office visit for refills. 07/23/23   Newlin, Enobong, MD  metFORMIN  (GLUCOPHAGE ) 1000 MG tablet Take 1 tablet (1,000 mg total) by mouth 2 (two) times daily. 07/03/23   Brent, Amanda C, NP  Multiple Vitamin (MULTIVITAMIN WITH MINERALS) TABS tablet Take 1 tablet by mouth daily. 07/03/23   Brent, Amanda C, NP  naproxen  (NAPROSYN ) 250 MG tablet Take 1 tablet (250 mg total) by mouth 2 (two) times daily as needed (Pain). 07/03/23   Brent, Amanda C, NP  traZODone  (DESYREL ) 50 MG tablet Take 1 tablet (50 mg total) by mouth at bedtime. 07/03/23   Brent, Amanda C, NP    Allergies: Lisinopril     Review of Systems  All other systems reviewed and are negative.   Updated Vital Signs BP (!) 132/94 (BP Location: Right Arm)   Pulse 99   Temp 98.1 F (36.7 C) (Oral)   Resp 18   SpO2 95%   Physical Exam Vitals and nursing note reviewed.  Constitutional:      General: He is not in acute distress.    Appearance: Normal appearance. He is well-developed. He is not toxic-appearing.  HENT:     Head: Normocephalic and atraumatic.  Eyes:     General: Lids are normal.     Conjunctiva/sclera: Conjunctivae normal.     Pupils: Pupils are equal, round, and reactive to light.  Neck:     Thyroid : No thyroid  mass.     Trachea: No tracheal deviation.  Cardiovascular:     Rate and Rhythm: Normal rate and regular rhythm.     Heart sounds: Normal heart sounds. No murmur heard.    No gallop.  Pulmonary:     Effort: Pulmonary effort is normal. No respiratory distress.     Breath sounds: Normal breath sounds. No  stridor. No decreased breath sounds, wheezing, rhonchi or rales.  Abdominal:     General: There is no distension.     Palpations: Abdomen is soft.     Tenderness: There is no abdominal tenderness. There is no rebound.  Musculoskeletal:        General: No tenderness. Normal range of motion.     Cervical back: Normal range of motion and neck supple.  Skin:    General: Skin is warm and dry.     Findings: No abrasion or rash.  Neurological:     Mental Status: He is alert and oriented to person, place, and time. Mental status is at baseline.     GCS: GCS eye subscore is 4. GCS verbal subscore is 5. GCS motor subscore is 6.     Cranial Nerves: No cranial nerve deficit.     Sensory: No sensory deficit.     Motor: Motor function is intact.  Psychiatric:        Attention and Perception: Attention normal.        Speech: Speech normal.        Behavior: Behavior normal.     (all labs ordered are listed, but only abnormal results are displayed) Labs Reviewed  CBG MONITORING, ED - Abnormal; Notable for the following components:      Result Value   Glucose-Capillary >600 (*)    All other components within normal limits  CBC  BASIC METABOLIC PANEL WITH GFR  BETA-HYDROXYBUTYRIC ACID  BLOOD GAS, VENOUS    EKG: EKG Interpretation Date/Time:  Saturday September 27 2023 10:45:51 EDT Ventricular Rate:  84 PR Interval:  150 QRS Duration:  75 QT Interval:  395 QTC Calculation: 467 R Axis:   8  Text Interpretation: Sinus rhythm Probable left atrial enlargement RSR' in V1 or V2, right VCD or RVH Confirmed by Dasie Faden ([id number]) on 09/27/2023 11:10:57 AM  Radiology: No results found.   Procedures   Medications Ordered in the ED  sodium chloride  0.9 % bolus 2,000 mL (has no administration in time range)  0.9 %  sodium chloride  infusion (has no administration in time range)                                    Medical Decision Making Amount and/or Complexity of Data Reviewed Labs:  ordered. Radiology: ordered. ECG/medicine tests: ordered.  Risk Prescription drug management.   Patient is EKG shows normal sinus rhythm.  Patient here with hyperglycemia given IV fluids and started on insulin  drip.  Labs significant for acute kidney injury.  Given aggressive fluid hydration.  No evidence  of DKA.  Patient will require admission.  Will consult hospitalist team     Final diagnoses:  None    ED Discharge Orders     None          Dasie Faden, MD 09/27/23 1207

## 2023-09-28 ENCOUNTER — Encounter (HOSPITAL_COMMUNITY): Payer: Self-pay | Admitting: Internal Medicine

## 2023-09-28 DIAGNOSIS — E1165 Type 2 diabetes mellitus with hyperglycemia: Secondary | ICD-10-CM | POA: Diagnosis not present

## 2023-09-28 LAB — GLUCOSE, CAPILLARY
Glucose-Capillary: 125 mg/dL — ABNORMAL HIGH (ref 70–99)
Glucose-Capillary: 128 mg/dL — ABNORMAL HIGH (ref 70–99)
Glucose-Capillary: 146 mg/dL — ABNORMAL HIGH (ref 70–99)
Glucose-Capillary: 148 mg/dL — ABNORMAL HIGH (ref 70–99)
Glucose-Capillary: 180 mg/dL — ABNORMAL HIGH (ref 70–99)
Glucose-Capillary: 193 mg/dL — ABNORMAL HIGH (ref 70–99)
Glucose-Capillary: 206 mg/dL — ABNORMAL HIGH (ref 70–99)
Glucose-Capillary: 281 mg/dL — ABNORMAL HIGH (ref 70–99)
Glucose-Capillary: 303 mg/dL — ABNORMAL HIGH (ref 70–99)
Glucose-Capillary: 70 mg/dL (ref 70–99)

## 2023-09-28 LAB — CBC
HCT: 40.3 % (ref 39.0–52.0)
Hemoglobin: 13.1 g/dL (ref 13.0–17.0)
MCH: 30.6 pg (ref 26.0–34.0)
MCHC: 32.5 g/dL (ref 30.0–36.0)
MCV: 94.2 fL (ref 80.0–100.0)
Platelets: 332 K/uL (ref 150–400)
RBC: 4.28 MIL/uL (ref 4.22–5.81)
RDW: 12.6 % (ref 11.5–15.5)
WBC: 7.6 K/uL (ref 4.0–10.5)
nRBC: 0 % (ref 0.0–0.2)

## 2023-09-28 LAB — HEMOGLOBIN AND HEMATOCRIT, BLOOD
HCT: 41.6 % (ref 39.0–52.0)
Hemoglobin: 13.8 g/dL (ref 13.0–17.0)

## 2023-09-28 LAB — ABO/RH
ABO/RH(D): A NEG
Weak D: POSITIVE

## 2023-09-28 LAB — BASIC METABOLIC PANEL WITH GFR
Anion gap: 8 (ref 5–15)
BUN: 20 mg/dL (ref 8–23)
CO2: 25 mmol/L (ref 22–32)
Calcium: 9.1 mg/dL (ref 8.9–10.3)
Chloride: 106 mmol/L (ref 98–111)
Creatinine, Ser: 1.17 mg/dL (ref 0.61–1.24)
GFR, Estimated: >60 mL/min (ref >60)
Glucose, Bld: 133 mg/dL — ABNORMAL HIGH (ref 70–99)
Potassium: 3.9 mmol/L (ref 3.5–5.1)
Sodium: 139 mmol/L (ref 135–145)

## 2023-09-28 LAB — TYPE AND SCREEN
ABO/RH(D): A NEG
Antibody Screen: NEGATIVE
Weak D: POSITIVE

## 2023-09-28 MED ORDER — INSULIN ASPART 100 UNIT/ML IJ SOLN
0.0000 [IU] | Freq: Every day | INTRAMUSCULAR | Status: DC
  Administered 2023-09-30: 2 [IU] via SUBCUTANEOUS

## 2023-09-28 MED ORDER — POTASSIUM CHLORIDE CRYS ER 20 MEQ PO TBCR
40.0000 meq | EXTENDED_RELEASE_TABLET | Freq: Once | ORAL | Status: AC
  Administered 2023-09-28: 40 meq via ORAL
  Filled 2023-09-28: qty 2

## 2023-09-28 MED ORDER — INSULIN GLARGINE-YFGN 100 UNIT/ML ~~LOC~~ SOLN
20.0000 [IU] | Freq: Every day | SUBCUTANEOUS | Status: DC
  Administered 2023-09-29 – 2023-09-30 (×2): 20 [IU] via SUBCUTANEOUS
  Filled 2023-09-28 (×3): qty 0.2

## 2023-09-28 MED ORDER — INSULIN ASPART 100 UNIT/ML IJ SOLN
0.0000 [IU] | Freq: Three times a day (TID) | INTRAMUSCULAR | Status: DC
  Administered 2023-09-28: 8 [IU] via SUBCUTANEOUS
  Administered 2023-09-28: 2 [IU] via SUBCUTANEOUS
  Administered 2023-09-28: 11 [IU] via SUBCUTANEOUS
  Administered 2023-09-29: 2 [IU] via SUBCUTANEOUS
  Administered 2023-09-29: 11 [IU] via SUBCUTANEOUS
  Administered 2023-09-29: 15 [IU] via SUBCUTANEOUS
  Administered 2023-09-30 (×3): 8 [IU] via SUBCUTANEOUS
  Administered 2023-10-01: 11 [IU] via SUBCUTANEOUS
  Administered 2023-10-01: 8 [IU] via SUBCUTANEOUS

## 2023-09-28 MED ORDER — INSULIN GLARGINE-YFGN 100 UNIT/ML ~~LOC~~ SOLN
20.0000 [IU] | Freq: Once | SUBCUTANEOUS | Status: AC
  Administered 2023-09-28: 20 [IU] via SUBCUTANEOUS
  Filled 2023-09-28: qty 0.2

## 2023-09-28 MED ORDER — SODIUM CHLORIDE 0.9 % IV SOLN: INTRAVENOUS | Status: DC

## 2023-09-28 MED ORDER — CHLORHEXIDINE GLUCONATE CLOTH 2 % EX PADS
6.0000 | MEDICATED_PAD | Freq: Every day | CUTANEOUS | Status: DC
  Administered 2023-09-28 – 2023-10-01 (×4): 6 via TOPICAL

## 2023-09-28 NOTE — Progress Notes (Signed)
 PROGRESS NOTE    David Wyatt  FMW:[id number] DOB: 12-Jan-1961 DOA: 09/27/2023 PCP: Delbert Clam, MD   Brief Narrative: David Wyatt is a 63 y.o. male with a history of homelessness, diabetes mellitus type 2, neuropathy.  Patient presented secondary to right arm and leg tingling and found to have severe hyperglycemia. Patient started on IV insulin  for management with improvement. Patient transitioned to subcutaneous insulin .   Assessment and Plan:  Hyperglycemia Significant hyperglycemia with glucose up to 764 with no associated confusion or acidosis, but with very mild ketosis. Patient started on insulin  IV and quickly transitioned to subcutaneous insulin .  Pseudohyponatremia Secondary to hyperglycemia. Resolved.  AKI Creatinine of 1.96 on admission. Improved with IV fluids. Creatinine down to 1.17.  Diabetes mellitus, type 2 Poorly controlled with hyperglycemia. Hemoglobin A1C of 13.9%. Complicated by patient's ability to refrigerate medication. -Continue SSI -Continue insulin  glargine -TOC consultation  Primary hypertension -Continue amlodipine   Peripheral neuropathy CT head negative for stroke. -Continue gabapentin    DVT prophylaxis: Heparin  subq Code Status:   Code Status: Full Code Family Communication: None Disposition Plan: Discharge pending TOC recommendations and PT/OT recommendations   Consultants:  None  Procedures:  None  Antimicrobials: None    Subjective: Patient reports no issues overnight. Still with bilateral leg tingling. Hungry.  Objective: BP 114/68   Pulse 63   Temp 98.6 F (37 C) (Oral)   Resp 11   Ht 5' 8 (1.727 m)   Wt 66.3 kg   SpO2 93%   BMI 22.22 kg/m   Examination:  General exam: Appears calm and comfortable Respiratory system: Clear to auscultation. Respiratory effort normal. Cardiovascular system: S1 & S2 heard, RRR. No murmurs. Gastrointestinal system: Abdomen is nondistended, soft and nontender. Normal  bowel sounds heard. Central nervous system: Alert and oriented. No focal neurological deficits. Psychiatry: Judgement and insight appear normal. Mood & affect appropriate.    Data Reviewed: I have personally reviewed following labs and imaging studies  CBC Lab Results  Component Value Date   WBC 7.6 09/28/2023   RBC 4.28 09/28/2023   HGB 13.1 09/28/2023   HCT 40.3 09/28/2023   MCV 94.2 09/28/2023   MCH 30.6 09/28/2023   PLT 332 09/28/2023   MCHC 32.5 09/28/2023   RDW 12.6 09/28/2023   LYMPHSABS 2.0 06/29/2023   MONOABS 0.5 06/29/2023   EOSABS 0.2 06/29/2023   BASOSABS 0.1 06/29/2023     Last metabolic panel Lab Results  Component Value Date   NA 139 09/28/2023   K 3.9 09/28/2023   CL 106 09/28/2023   CO2 25 09/28/2023   BUN 20 09/28/2023   CREATININE 1.17 09/28/2023   GLUCOSE 133 (H) 09/28/2023   GFRNONAA >60 09/28/2023   GFRAA 91 11/03/2019   CALCIUM  9.1 09/28/2023   PHOS 2.7 07/15/2008   PROT 7.5 09/14/2023   ALBUMIN 3.6 09/14/2023   LABGLOB 3.0 05/23/2021   AGRATIO 1.4 05/23/2021   BILITOT 1.0 09/14/2023   ALKPHOS 95 09/14/2023   AST 16 09/14/2023   ALT 15 09/14/2023   ANIONGAP 8 09/28/2023    GFR: Estimated Creatinine Clearance: 60.6 mL/min (by C-G formula based on SCr of 1.17 mg/dL).  Recent Results (from the past 240 hours)  MRSA Next Gen by PCR, Nasal     Status: None   Collection Time: 09/27/23  3:30 PM   Specimen: Nasal Mucosa; Nasal Swab  Result Value Ref Range Status   MRSA by PCR Next Gen NOT DETECTED NOT DETECTED Final    Comment: (NOTE)  The GeneXpert MRSA Assay (FDA approved for NASAL specimens only), is one component of a comprehensive MRSA colonization surveillance program. It is not intended to diagnose MRSA infection nor to guide or monitor treatment for MRSA infections. Test performance is not FDA approved in patients less than 33 years old. Performed at [facility], [address] [place], [place] [address]        Radiology Studies: CT Head Wo Contrast Result Date: 09/27/2023 CLINICAL DATA:  63 year old male with headache, right extremity tingling. EXAM: CT HEAD WITHOUT CONTRAST TECHNIQUE: Contiguous axial images were obtained from the base of the skull through the vertex without intravenous contrast. RADIATION DOSE REDUCTION: This exam was performed according to the departmental dose-optimization program which includes automated exposure control, adjustment of the mA and/or kV according to patient size and/or use of iterative reconstruction technique. COMPARISON:  Brain MRI 05/06/2017.  Head CT 07/12/2022. FINDINGS: Brain: Stable cerebral volume. No midline shift, ventriculomegaly, mass effect, evidence of mass lesion, intracranial hemorrhage or evidence of cortically based acute infarction. Patchy and confluent bilateral cerebral white matter hypodensity. Some deep white matter capsule involvement. Mild heterogeneity in the deep gray nuclei. Subtle chronic left cerebellar infarct(s). Vascular: No suspicious intracranial vascular hyperdensity. Calcified atherosclerosis at the skull base. Skull: Intact.  No acute osseous abnormality identified. Sinuses/Orbits: Visualized paranasal sinuses and mastoids are clear. Other: Visualized orbits and scalp soft tissues are within normal limits. IMPRESSION: 1. No acute intracranial abnormality. 2. Stable non contrast CT appearance of moderately advanced chronic small vessel disease. Electronically Signed   By: VEAR Hurst M.D.   On: 09/27/2023 11:15      LOS: 1 day    Elgin Lam, MD [facility] 09/28/2023, 8:40 AM   If 7PM-7AM, please contact night-coverage [email or web address]

## 2023-09-28 NOTE — Plan of Care (Signed)
  Problem: Health Behavior/Discharge Planning: Goal: Ability to manage health-related needs will improve Outcome: Not Progressing   Problem: Clinical Measurements: Goal: Ability to maintain clinical measurements within normal limits will improve Outcome: Not Progressing   Problem: Nutrition: Goal: Adequate nutrition will be maintained Outcome: Not Progressing   Problem: Coping: Goal: Level of anxiety will decrease Outcome: Not Progressing   Problem: Pain Managment: Goal: General experience of comfort will improve and/or be controlled Outcome: Not Progressing

## 2023-09-28 NOTE — Hospital Course (Signed)
 David Wyatt is a 63 y.o. male with a history of homelessness, diabetes mellitus type 2, neuropathy.  Patient presented secondary to right arm and leg tingling and found to have severe hyperglycemia. Patient started on IV insulin  for management with improvement. Patient transitioned to subcutaneous insulin .

## 2023-09-28 NOTE — Progress Notes (Signed)
   09/28/23 1549  TOC Brief Assessment  Insurance and Status Reviewed  Patient has primary care physician Yes (NEWLIN, ENOBONG)  Home environment has been reviewed Reports homeless  Prior level of function: Independent  Prior/Current Home Services No current home services  Social Drivers of Health Review SDOH reviewed needs interventions  Readmission risk has been reviewed Yes  Transition of care needs transition of care needs identified, TOC will continue to follow   CSW received Aneth from RN/MD pt needs DC with medication/insulin - he reportedly is homeless and trouble storing medication. CSW called sister contact on file who consent granted. She states that she travels a lot and her brother cannot store medicine in her home.  She is not aware of where he has been residing. CSW will provide pt information on [facility] to call for shelter stay wait list. Pt has a listed mobile number, sister indicated the phone may be lost or stolen.  TOC following.

## 2023-09-29 ENCOUNTER — Inpatient Hospital Stay (HOSPITAL_COMMUNITY): Payer: MEDICAID

## 2023-09-29 ENCOUNTER — Other Ambulatory Visit: Payer: Self-pay

## 2023-09-29 DIAGNOSIS — R202 Paresthesia of skin: Secondary | ICD-10-CM

## 2023-09-29 DIAGNOSIS — E1165 Type 2 diabetes mellitus with hyperglycemia: Secondary | ICD-10-CM | POA: Diagnosis not present

## 2023-09-29 LAB — GLUCOSE, CAPILLARY
Glucose-Capillary: 397 mg/dL — ABNORMAL HIGH (ref 70–99)
Glucose-Capillary: 144 mg/dL — ABNORMAL HIGH (ref 70–99)
Glucose-Capillary: 314 mg/dL — ABNORMAL HIGH (ref 70–99)
Glucose-Capillary: 192 mg/dL — ABNORMAL HIGH (ref 70–99)

## 2023-09-29 LAB — VAS US ABI WITH/WO TBI
Left ABI: 0.67
Right ABI: 0.66

## 2023-09-29 NOTE — Evaluation (Signed)
 Occupational Therapy Evaluation Patient Details Name: David Wyatt MRN: [id number] DOB: 09-Oct-1960 Today's Date: 09/29/2023   History of Present Illness   David Wyatt is a 63 y.o. male with a history of homelessness, diabetes mellitus type 2, neuropathy.  Patient presented secondary to right arm and leg tingling and found to have severe hyperglycemia. Patient started on IV insulin  for management with improvement. Patient transitioned to subcutaneous insulin .     Clinical Impressions PTA, patient has been homeless and reports living and doing everything from the street with my walker. Currently, patient presents with deficits outlined below (see OT Problem List for details) most significantly decreased balance and activity tolerance limiting BADL's in standing and functional mobility.  Patient requires continued Acute care hospital level OT services to progress safety and functional performance and allow for discharge. Patient has social issues requiring TOC assistance and patient would benefit from [facility] if able to secure housing with recommendation for a new rollator due to current device in ill-repair.        If plan is discharge home, recommend the following:   A little help with walking and/or transfers;Assistance with cooking/housework;Assist for transportation;Help with stairs or ramp for entrance     Functional Status Assessment   Patient has had a recent decline in their functional status and demonstrates the ability to make significant improvements in function in a reasonable and predictable amount of time.     Equipment Recommendations   Other (comment) (may benefit from a new rollator (ill repair))      Precautions/Restrictions   Precautions Precautions: Fall Restrictions Weight Bearing Restrictions Per Provider Order: No     Mobility Bed Mobility Overal bed mobility: Needs Assistance Bed Mobility: Supine to Sit     Supine to sit: Supervision      General bed mobility comments: min cues for being aware of IV lines    Transfers Overall transfer level: Needs assistance Equipment used: Rollator (4 wheels) Transfers: Sit to/from Stand, Bed to chair/wheelchair/BSC Sit to Stand: Contact guard assist     Step pivot transfers: Contact guard assist     General transfer comment: min cues for hand placement      Balance Overall balance assessment: Needs assistance Sitting-balance support: Feet supported Sitting balance-Leahy Scale: Good     Standing balance support: During functional activity, Bilateral upper extremity supported Standing balance-Leahy Scale: Poor Standing balance comment: ataxic, relies on upper body support                           ADL either performed or assessed with clinical judgement   ADL Overall ADL's : Needs assistance/impaired Eating/Feeding: Independent;Sitting   Grooming: Wash/dry hands;Wash/dry face;Oral care;Sitting   Upper Body Bathing: Modified independent;Sitting   Lower Body Bathing: Modified independent;Sit to/from stand Lower Body Bathing Details (indicate cue type and reason): able to figure 4 to reach feet in sitting Upper Body Dressing : Sitting;Modified independent   Lower Body Dressing: Modified independent;Sit to/from stand   Toilet Transfer: Contact guard assist   Toileting- Clothing Manipulation and Hygiene: Supervision/safety       Functional mobility during ADLs: Contact guard assist;Rollator (4 wheels) General ADL Comments: mod I seated ADL's, needs CGA for standing level     Vision Baseline Vision/History: 0 No visual deficits Ability to See in Adequate Light: 0 Adequate Patient Visual Report: No change from baseline Vision Assessment?: No apparent visual deficits  Pertinent Vitals/Pain Pain Assessment Pain Assessment: No/denies pain     Extremity/Trunk Assessment Upper Extremity Assessment Upper Extremity Assessment: Right hand  dominant;Overall WFL for tasks assessed   Lower Extremity Assessment Lower Extremity Assessment: Defer to PT evaluation   Cervical / Trunk Assessment Cervical / Trunk Assessment: Normal   Communication Communication Communication: No apparent difficulties   Cognition Arousal: Alert Behavior During Therapy: WFL for tasks assessed/performed Cognition: No apparent impairments                               Following commands: Intact       Cueing  General Comments   Cueing Techniques: Verbal cues  L toenails long, no SOB, 98% SpO2 on RA           Home Living Family/patient expects to be discharged to:: Shelter/Homeless                                 Additional Comments: I have been using the walker and use the street or gas station for bathroom      Prior Functioning/Environment Prior Level of Function : Independent/Modified Independent             Mobility Comments: use of rollator for amb ADLs Comments: mod I with rollator with report of using anywhere I can find for toileting and meals, no medication storage    OT Problem List: Impaired balance (sitting and/or standing);Decreased activity tolerance   OT Treatment/Interventions: Self-care/ADL training;Therapeutic exercise;Neuromuscular education;Energy conservation;DME and/or AE instruction;Therapeutic activities;Patient/family education;Balance training      OT Goals(Current goals can be found in the care plan section)   Acute Rehab OT Goals Patient Stated Goal: to walk better OT Goal Formulation: With patient Time For Goal Achievement: 10/13/23 Potential to Achieve Goals: Good ADL Goals Pt Will Transfer to Toilet: with modified independence;regular height toilet;ambulating Pt Will Perform Toileting - Clothing Manipulation and hygiene: with modified independence;sit to/from stand Pt/caregiver will Perform Home Exercise Program: Increased strength;Both right and left upper  extremity;With written HEP provided;Independently Additional ADL Goal #1: Patient will teach back 3/4 ECTs independently for ADL's and mobility   OT Frequency:  Min 2X/week       AM-PAC OT 6 Clicks Daily Activity     Outcome Measure Help from another person eating meals?: None Help from another person taking care of personal grooming?: None Help from another person toileting, which includes using toliet, bedpan, or urinal?: None Help from another person bathing (including washing, rinsing, drying)?: None Help from another person to put on and taking off regular upper body clothing?: None Help from another person to put on and taking off regular lower body clothing?: None 6 Click Score: 24   End of Session Equipment Utilized During Treatment: Rollator (4 wheels);Gait belt Nurse Communication: Mobility status;Other (comment) (patient asked about graham crackers)  Activity Tolerance: Patient tolerated treatment well Patient left: in chair;with call bell/phone within reach;with chair alarm set  OT Visit Diagnosis: Unsteadiness on feet (R26.81);Ataxia, unspecified (R27.0)                Time: 9156-9079 OT Time Calculation (min): 37 min Charges:  OT General Charges $OT Visit: 1 Visit OT Evaluation $OT Eval Low Complexity: 1 Low OT Treatments $Self Care/Home Management : 8-22 mins  Najma Bozarth OT/L Acute Rehabilitation Department  [phone]  09/29/2023, 9:52 AM

## 2023-09-29 NOTE — Progress Notes (Signed)
 The patient educated for self administration of insulin  via SQ injection; included drawing up insulin , cleaning the area with alcohol prior to administration, and self SQ injection. The patient had difficulties drawing up the insulin  and reading the insulin  syringe. Per patient I cannot see the number. The patient self administered insulin , SQ, successfully.

## 2023-09-29 NOTE — Telephone Encounter (Signed)
 Patient currently hospitalized.

## 2023-09-29 NOTE — Progress Notes (Signed)
 PROGRESS NOTE    David Wyatt  FMW:[id number] DOB: 1960-07-03 DOA: 09/27/2023 PCP: Delbert Clam, MD   Brief Narrative: David Wyatt is a 63 y.o. male with a history of homelessness, diabetes mellitus type 2, neuropathy.  Patient presented secondary to right arm and leg tingling and found to have severe hyperglycemia. Patient started on IV insulin  for management with improvement. Patient transitioned to subcutaneous insulin .   Assessment and Plan:  Hyperglycemia Significant hyperglycemia with glucose up to 764 with no associated confusion or acidosis, but with very mild ketosis. Patient started on insulin  IV and quickly transitioned to subcutaneous insulin .  Pseudohyponatremia Secondary to hyperglycemia. Resolved.  AKI Creatinine of 1.96 on admission. Improved with IV fluids. Creatinine down to 1.17.  Diabetes mellitus, type 2 Poorly controlled with hyperglycemia. Hemoglobin A1C of 13.9%. Complicated by patient's ability to refrigerate medication. -Continue SSI -Continue insulin  glargine -TOC consultation  Primary hypertension -Continue amlodipine   Peripheral neuropathy CT head negative for stroke. Patient does report some symptoms possible consistent with claudication, but he is not a great historian. -Continue gabapentin  -Check ABIs   DVT prophylaxis: Heparin  subq Code Status:   Code Status: Full Code Family Communication: None Disposition Plan: Discharge pending TOC recommendations and PT/OT recommendations   Consultants:  None  Procedures:  None  Antimicrobials: None    Subjective: Still having leg pain. No other concerns.  Objective: BP (!) 142/80   Pulse 67   Temp 98.1 F (36.7 C)   Resp 16   Ht 5' 8 (1.727 m)   Wt 66.3 kg   SpO2 100%   BMI 22.22 kg/m   Examination:  General exam: Appears calm and comfortable Respiratory system: Clear to auscultation. Respiratory effort normal. Cardiovascular system: S1 & S2 heard, RRR. No murmurs.  Could not palpate DP pulses. Gastrointestinal system: Abdomen is nondistended, soft and nontender. Normal bowel sounds heard. Central nervous system: Alert and oriented. No focal neurological deficits. Musculoskeletal: No edema. No calf tenderness Psychiatry: Judgement and insight appear normal. Mood & affect appropriate.    Data Reviewed: I have personally reviewed following labs and imaging studies  CBC Lab Results  Component Value Date   WBC 7.6 09/28/2023   RBC 4.28 09/28/2023   HGB 13.8 09/28/2023   HCT 41.6 09/28/2023   MCV 94.2 09/28/2023   MCH 30.6 09/28/2023   PLT 332 09/28/2023   MCHC 32.5 09/28/2023   RDW 12.6 09/28/2023   LYMPHSABS 2.0 06/29/2023   MONOABS 0.5 06/29/2023   EOSABS 0.2 06/29/2023   BASOSABS 0.1 06/29/2023     Last metabolic panel Lab Results  Component Value Date   NA 139 09/28/2023   K 3.9 09/28/2023   CL 106 09/28/2023   CO2 25 09/28/2023   BUN 20 09/28/2023   CREATININE 1.17 09/28/2023   GLUCOSE 133 (H) 09/28/2023   GFRNONAA >60 09/28/2023   GFRAA 91 11/03/2019   CALCIUM  9.1 09/28/2023   PHOS 2.7 07/15/2008   PROT 7.5 09/14/2023   ALBUMIN 3.6 09/14/2023   LABGLOB 3.0 05/23/2021   AGRATIO 1.4 05/23/2021   BILITOT 1.0 09/14/2023   ALKPHOS 95 09/14/2023   AST 16 09/14/2023   ALT 15 09/14/2023   ANIONGAP 8 09/28/2023    GFR: Estimated Creatinine Clearance: 60.6 mL/min (by C-G formula based on SCr of 1.17 mg/dL).  Recent Results (from the past 240 hours)  MRSA Next Gen by PCR, Nasal     Status: None   Collection Time: 09/27/23  3:30 PM   Specimen: Nasal Mucosa;  Nasal Swab  Result Value Ref Range Status   MRSA by PCR Next Gen NOT DETECTED NOT DETECTED Final    Comment: (NOTE) The GeneXpert MRSA Assay (FDA approved for NASAL specimens only), is one component of a comprehensive MRSA colonization surveillance program. It is not intended to diagnose MRSA infection nor to guide or monitor treatment for MRSA infections. Test  performance is not FDA approved in patients less than 36 years old. Performed at [facility], [address] [place], [place] [address]       Radiology Studies: CT Head Wo Contrast Result Date: 09/27/2023 CLINICAL DATA:  63 year old male with headache, right extremity tingling. EXAM: CT HEAD WITHOUT CONTRAST TECHNIQUE: Contiguous axial images were obtained from the base of the skull through the vertex without intravenous contrast. RADIATION DOSE REDUCTION: This exam was performed according to the departmental dose-optimization program which includes automated exposure control, adjustment of the mA and/or kV according to patient size and/or use of iterative reconstruction technique. COMPARISON:  Brain MRI 05/06/2017.  Head CT 07/12/2022. FINDINGS: Brain: Stable cerebral volume. No midline shift, ventriculomegaly, mass effect, evidence of mass lesion, intracranial hemorrhage or evidence of cortically based acute infarction. Patchy and confluent bilateral cerebral white matter hypodensity. Some deep white matter capsule involvement. Mild heterogeneity in the deep gray nuclei. Subtle chronic left cerebellar infarct(s). Vascular: No suspicious intracranial vascular hyperdensity. Calcified atherosclerosis at the skull base. Skull: Intact.  No acute osseous abnormality identified. Sinuses/Orbits: Visualized paranasal sinuses and mastoids are clear. Other: Visualized orbits and scalp soft tissues are within normal limits. IMPRESSION: 1. No acute intracranial abnormality. 2. Stable non contrast CT appearance of moderately advanced chronic small vessel disease. Electronically Signed   By: VEAR Hurst M.D.   On: 09/27/2023 11:15      LOS: 2 days    Elgin Lam, MD [facility] 09/29/2023, 8:35 AM   If 7PM-7AM, please contact night-coverage [email or web address]

## 2023-09-29 NOTE — Progress Notes (Signed)
 VASCULAR LAB    ABI has been performed.  See CV proc for preliminary results.   Trudi Morgenthaler, RVT 09/29/2023, 1:27 PM

## 2023-09-29 NOTE — Inpatient Diabetes Management (Signed)
 Inpatient Diabetes Program Recommendations  AACE/ADA: New Consensus Statement on Inpatient Glycemic Control (2015)  Target Ranges:  Prepandial:   less than 140 mg/dL      Peak postprandial:   less than 180 mg/dL (1-2 hours)      Critically ill patients:  140 - 180 mg/dL   Lab Results  Component Value Date   GLUCAP 397 (H) 09/29/2023   HGBA1C 13.9 (H) 09/27/2023    Review of Glycemic Control  Diabetes history: DM2 Outpatient Diabetes medications: Novolog  10 units TID, Lantus  24 units at bedtime (not taken meds in approx 30 days), metformin  1000 mg BID Current orders for Inpatient glycemic control: Semglee  20 daily, Novolog  0-15 TID with meals and 0-5 HS  HgbA1C - 13.9%  Inpatient Diabetes Program Recommendations:    Consider adding Novolog  4 units TID with meals if eating > 50%.  Will need lancets and strips for Accucheck meter.  Consider 70/30 insulin  at discharge.  Could we provide a small insulated bag to store his insulin  if he cannot find housing?  Will continue to follow.  Thank you. Shona Brandy, RD, LDN, CDCES Inpatient Diabetes Coordinator [phone]

## 2023-09-29 NOTE — TOC Transition Note (Addendum)
 Transition of Care [facility]) - Discharge Note   Patient Details  Name: David Wyatt MRN: [id number] Date of Birth: 1960-11-21  [facility] of [facility]) CM/SW Contact:  Doneta Glenys DASEN, RN Phone Number: 09/29/2023, 8:36 AM   Clinical Narrative:    CM spoke with Diabetes Coordinator to assist with problem solving for patients lack of refrigeration for insulin  due to be homeless. Place a consult. 2:44 PM CM spoke with patient in the room. Give resource for [facility]. Discuss Selinda ? [employer] CM [phone]. CM called and left message.     Barriers to Discharge: Continued Medical Work up   Patient Goals and CMS Choice            Discharge Placement                       Discharge Plan and Services Additional resources added to the After Visit Summary for                                       Social Drivers of Health (SDOH) Interventions SDOH Screenings   Food Insecurity: Food Insecurity Present (09/27/2023)  Housing: High Risk (09/27/2023)  Transportation Needs: Unmet Transportation Needs (09/27/2023)  Utilities: At Risk (09/27/2023)  Alcohol Screen: Low Risk  (05/19/2023)  Depression (PHQ2-9): Low Risk  (09/17/2022)  Recent Concern: Depression (PHQ2-9) - Medium Risk (07/25/2022)  Tobacco Use: High Risk (09/28/2023)     Readmission Risk Interventions     No data to display

## 2023-09-29 NOTE — Evaluation (Signed)
 Physical Therapy Evaluation Patient Details Name: David Wyatt MRN: [id number] DOB: 1961-02-29 Today's Date: 09/29/2023  History of Present Illness  David Wyatt is a 63 y.o. male with a history of homelessness, diabetes mellitus type 2, neuropathy.  Patient presented to hospital 09/27/2023 secondary to right arm and leg tingling and found to have severe hyperglycemia. Patient started on IV insulin  for management with improvement. Patient transitioned to subcutaneous insulin .  Clinical Impression    Pt admitted with above diagnosis.  Pt currently with functional limitations due to the deficits listed below (see PT Problem List). Pt seated in recliner when PT arrived. Pt indicated that he was feeling a little confused and off today. Pt however agreeable to therapy intervention. Pt reported R UE and LE weakness and abn sensation as well as 8/10 pain. Pt required CGA for sit to stand  from recliner, CGA for gait tasks in hallway 180 feet with min cues and personal rollator. Pt left seated in recliner, all needs in place and nurse aware of mobility status and pain report. Pt will benefit from acute skilled PT to increase their independence and safety with mobility to allow discharge.         If plan is discharge home, recommend the following: A little help with walking and/or transfers;A little help with bathing/dressing/bathroom;Assistance with cooking/housework;Assist for transportation   Can travel by private vehicle        Equipment Recommendations None recommended by PT  Recommendations for Other Services       Functional Status Assessment Patient has had a recent decline in their functional status and demonstrates the ability to make significant improvements in function in a reasonable and predictable amount of time.     Precautions / Restrictions Precautions Precautions: Fall Restrictions Weight Bearing Restrictions Per Provider Order: No      Mobility  Bed Mobility                General bed mobility comments: pt seated in recliner upon arrival and returned to recliner at end of eval    Transfers Overall transfer level: Needs assistance Equipment used: Rollator (4 wheels) Transfers: Sit to/from Stand Sit to Stand: Contact guard assist           General transfer comment: min cues for hand placement and pt stating brakes on rollator do not work well, pt is able to demonstrate ability to lock/unlock however poor recall for doing so with transfer tasks and will require reinforcement for safety, recall and techniqe    Ambulation/Gait Ambulation/Gait assistance: Contact guard assist Gait Distance (Feet): 180 Feet Assistive device: Rollator (4 wheels) Gait Pattern/deviations: Step-through pattern, Narrow base of support, Trunk flexed Gait velocity: decreased     General Gait Details: min cues for safety, posture, proper distance from rollator with intermittent R LE instability noted and occationally pt requiring to reposition R UE on rollator handles due to poor motor control  and coordination as well as decreased sensation  Stairs            Wheelchair Mobility     Tilt Bed    Modified Rankin (Stroke Patients Only)       Balance Overall balance assessment: Needs assistance Sitting-balance support: Feet supported Sitting balance-Leahy Scale: Good     Standing balance support: During functional activity, Bilateral upper extremity supported, Reliant on assistive device for balance Standing balance-Leahy Scale: Poor Standing balance comment: pt reported slight dizziness with intial standing, quickly resolved and required B UE support at rollator for  stability no overt LOB noted                             Pertinent Vitals/Pain Pain Assessment Pain Assessment: 0-10 Pain Score: 8  Pain Location: R UE and LE Pain Descriptors / Indicators: Aching, Constant, Discomfort, Tingling Pain Intervention(s): Limited activity within  patient's tolerance, Monitored during session, Repositioned    Home Living Family/patient expects to be discharged to:: Shelter/Homeless                   Additional Comments: I have been using the walker and use the street or gas station for bathroom    Prior Function Prior Level of Function : Independent/Modified Independent             Mobility Comments: use of rollator for amb ADLs Comments: mod I with rollator with report of using anywhere I can find for toileting and meals, no medication storage     Extremity/Trunk Assessment   Upper Extremity Assessment Upper Extremity Assessment: Right hand dominant;Overall WFL for tasks assessed    Lower Extremity Assessment Lower Extremity Assessment: Generalized weakness (R UE and LE generalized weakness with reports of numbness and tingling and noted R UE and LE motor coordination and control deficits with gait tasks)    Cervical / Trunk Assessment Cervical / Trunk Assessment: Normal  Communication   Communication Communication: No apparent difficulties    Cognition Arousal: Alert Behavior During Therapy: WFL for tasks assessed/performed   PT - Cognitive impairments: No apparent impairments                       PT - Cognition Comments: pt states he is a little confused, however able to communicate and follow commands appropriately Following commands: Intact       Cueing       General Comments General comments (skin integrity, edema, etc.): L toenails long, no SOB, 98% SpO2 on RA    Exercises     Assessment/Plan    PT Assessment Patient needs continued PT services  PT Problem List Decreased strength;Decreased activity tolerance;Decreased balance;Decreased mobility;Decreased coordination;Decreased safety awareness;Pain       PT Treatment Interventions DME instruction;Gait training;Functional mobility training;Therapeutic activities;Therapeutic exercise;Balance training;Neuromuscular  re-education;Patient/family education    PT Goals (Current goals can be found in the Care Plan section)  Acute Rehab PT Goals Patient Stated Goal: to be able to get stronger PT Goal Formulation: With patient Time For Goal Achievement: 10/20/23 Potential to Achieve Goals: Good    Frequency Min 3X/week     Co-evaluation               AM-PAC PT 6 Clicks Mobility  Outcome Measure Help needed turning from your back to your side while in a flat bed without using bedrails?: None Help needed moving from lying on your back to sitting on the side of a flat bed without using bedrails?: A Little Help needed moving to and from a bed to a chair (including a wheelchair)?: A Little Help needed standing up from a chair using your arms (e.g., wheelchair or bedside chair)?: A Little Help needed to walk in hospital room?: A Little Help needed climbing 3-5 steps with a railing? : A Lot 6 Click Score: 18    End of Session Equipment Utilized During Treatment: Gait belt Activity Tolerance: Patient tolerated treatment well Patient left: in chair;with call bell/phone within reach;with chair alarm set Nurse  Communication: Mobility status PT Visit Diagnosis: Unsteadiness on feet (R26.81);Other abnormalities of gait and mobility (R26.89);Repeated falls (R29.6);Muscle weakness (generalized) (M62.81);Difficulty in walking, not elsewhere classified (R26.2);Pain Pain - Right/Left: Right Pain - part of body: Shoulder;Arm;Knee;Leg;Hip    Time: 8993-8978 PT Time Calculation (min) (ACUTE ONLY): 15 min   Charges:   PT Evaluation $PT Eval Low Complexity: 1 Low   PT General Charges $$ ACUTE PT VISIT: 1 Visit         David Wyatt, PT Acute Rehab   David Wyatt David Wyatt 09/29/2023, 10:37 AM

## 2023-09-30 ENCOUNTER — Ambulatory Visit: Payer: MEDICAID | Admitting: Family Medicine

## 2023-09-30 DIAGNOSIS — E1165 Type 2 diabetes mellitus with hyperglycemia: Secondary | ICD-10-CM | POA: Diagnosis not present

## 2023-09-30 LAB — GLUCOSE, CAPILLARY
Glucose-Capillary: 269 mg/dL — ABNORMAL HIGH (ref 70–99)
Glucose-Capillary: 284 mg/dL — ABNORMAL HIGH (ref 70–99)
Glucose-Capillary: 240 mg/dL — ABNORMAL HIGH (ref 70–99)
Glucose-Capillary: 209 mg/dL — ABNORMAL HIGH (ref 70–99)

## 2023-09-30 NOTE — TOC Progression Note (Signed)
 Transition of Care [facility]) - Progression Note    Patient Details  Name: David Wyatt MRN: [id number] Date of Birth: 1960/04/17  [facility] CM/SW Contact  Doneta Glenys DASEN, RN Phone Number: 09/30/2023, 9:38 AM  Clinical Narrative:    CM called [name] [phone]. Christian states that [name] is the assigned care Production designer, theatre/television/film. CM give patients room telephone and cell phone number list in system to to [place]  for Delon to contact patient.     Barriers to Discharge: Continued Medical Work up  Expected Discharge Plan and Services                                               Social Determinants of Health (SDOH) Interventions SDOH Screenings   Food Insecurity: Food Insecurity Present (09/27/2023)  Housing: High Risk (09/27/2023)  Transportation Needs: Unmet Transportation Needs (09/27/2023)  Utilities: At Risk (09/27/2023)  Alcohol Screen: Low Risk  (05/19/2023)  Depression (PHQ2-9): Low Risk  (09/17/2022)  Recent Concern: Depression (PHQ2-9) - Medium Risk (07/25/2022)  Tobacco Use: High Risk (09/28/2023)    Readmission Risk Interventions     No data to display

## 2023-09-30 NOTE — Inpatient Diabetes Management (Incomplete)
 Inpatient Diabetes Program Recommendations  AACE/ADA: New Consensus Statement on Inpatient Glycemic Control (2015)  Target Ranges:  Prepandial:   less than 140 mg/dL      Peak postprandial:   less than 180 mg/dL (1-2 hours)      Critically ill patients:  140 - 180 mg/dL   Lab Results  Component Value Date   GLUCAP 269 (H) 09/30/2023   HGBA1C 13.9 (H) 09/27/2023    Review of Glycemic Control  Diabetes history: DM2 Outpatient Diabetes medications: *** Current orders for Inpatient glycemic control: ***  Inpatient Diabetes Program Recommendations:

## 2023-09-30 NOTE — Plan of Care (Signed)
  Problem: Education: Goal: Knowledge of General Education information will improve Description: Including pain rating scale, medication(s)/side effects and non-pharmacologic comfort measures Outcome: Progressing   Problem: Health Behavior/Discharge Planning: Goal: Ability to manage health-related needs will improve Outcome: Progressing   Problem: Clinical Measurements: Goal: Diagnostic test results will improve Outcome: Progressing   Problem: Clinical Measurements: Goal: Ability to maintain clinical measurements within normal limits will improve Outcome: Not Progressing   Problem: Clinical Measurements: Goal: Respiratory complications will improve Outcome: Not Applicable Goal: Cardiovascular complication will be avoided Outcome: Not Applicable

## 2023-09-30 NOTE — Progress Notes (Signed)
 PROGRESS NOTE    David Wyatt  FMW:[id number] DOB: May 13, 1960 DOA: 09/27/2023 PCP: Delbert Clam, MD   Brief Narrative: David Wyatt is a 63 y.o. male with a history of homelessness, diabetes mellitus type 2, neuropathy.  Patient presented secondary to right arm and leg tingling and found to have severe hyperglycemia. Patient started on IV insulin  for management with improvement. Patient transitioned to subcutaneous insulin .   Assessment and Plan:  Hyperglycemia Significant hyperglycemia with glucose up to 764 with no associated confusion or acidosis, but with very mild ketosis. Patient started on insulin  IV and quickly transitioned to subcutaneous insulin .  Pseudohyponatremia Secondary to hyperglycemia. Resolved.  AKI Creatinine of 1.96 on admission. Improved with IV fluids. Creatinine down to 1.17.  Diabetes mellitus, type 2 Poorly controlled with hyperglycemia. Hemoglobin A1C of 13.9%. Complicated by patient's ability to refrigerate medication. -Continue SSI -Continue insulin  glargine -TOC consultation  Primary hypertension -Continue amlodipine   PAD Moderate disease noted bilaterally. Discussed with on-call vascular surgery; since no current wound, recommendation for outpatient follow-up.  Peripheral neuropathy CT head negative for stroke. Patient does report some symptoms possible consistent with claudication, but he is not a great historian. -Continue gabapentin    DVT prophylaxis: Heparin  subq Code Status:   Code Status: Full Code Family Communication: None Disposition Plan: Discharge pending TOC recommendations so patient can get access to insulin  storage outpatient. Continues to be medically stable for discharge since 7/21.   Consultants:  None  Procedures:  ABIs  Antimicrobials: None    Subjective: No issues noted from overnight events.  Objective: BP 138/87   Pulse 71   Temp 98.1 F (36.7 C)   Resp 14   Ht 5' 8 (1.727 m)   Wt 66.3 kg    SpO2 97%   BMI 22.22 kg/m   Examination:  General exam: Appears calm and comfortable Respiratory system:  Respiratory effort normal.   Data Reviewed: I have personally reviewed following labs and imaging studies  CBC Lab Results  Component Value Date   WBC 7.6 09/28/2023   RBC 4.28 09/28/2023   HGB 13.8 09/28/2023   HCT 41.6 09/28/2023   MCV 94.2 09/28/2023   MCH 30.6 09/28/2023   PLT 332 09/28/2023   MCHC 32.5 09/28/2023   RDW 12.6 09/28/2023   LYMPHSABS 2.0 06/29/2023   MONOABS 0.5 06/29/2023   EOSABS 0.2 06/29/2023   BASOSABS 0.1 06/29/2023     Last metabolic panel Lab Results  Component Value Date   NA 139 09/28/2023   K 3.9 09/28/2023   CL 106 09/28/2023   CO2 25 09/28/2023   BUN 20 09/28/2023   CREATININE 1.17 09/28/2023   GLUCOSE 133 (H) 09/28/2023   GFRNONAA >60 09/28/2023   GFRAA 91 11/03/2019   CALCIUM  9.1 09/28/2023   PHOS 2.7 07/15/2008   PROT 7.5 09/14/2023   ALBUMIN 3.6 09/14/2023   LABGLOB 3.0 05/23/2021   AGRATIO 1.4 05/23/2021   BILITOT 1.0 09/14/2023   ALKPHOS 95 09/14/2023   AST 16 09/14/2023   ALT 15 09/14/2023   ANIONGAP 8 09/28/2023    GFR: Estimated Creatinine Clearance: 60.6 mL/min (by C-G formula based on SCr of 1.17 mg/dL).  Recent Results (from the past 240 hours)  MRSA Next Gen by PCR, Nasal     Status: None   Collection Time: 09/27/23  3:30 PM   Specimen: Nasal Mucosa; Nasal Swab  Result Value Ref Range Status   MRSA by PCR Next Gen NOT DETECTED NOT DETECTED Final    Comment: (NOTE)  The GeneXpert MRSA Assay (FDA approved for NASAL specimens only), is one component of a comprehensive MRSA colonization surveillance program. It is not intended to diagnose MRSA infection nor to guide or monitor treatment for MRSA infections. Test performance is not FDA approved in patients less than 39 years old. Performed at [facility], [address] [place], [place] [address]       Radiology Studies: VAS US   ABI WITH/WO TBI Result Date: 09/29/2023  LOWER EXTREMITY DOPPLER STUDY Patient Name:  David Wyatt  Date of Exam:   09/29/2023 Medical Rec #: [id number]      Accession #:    [id number] Date of Birth: April 10, 1960       Patient Gender: M Patient Age:   63 years Exam Location:  [facility] Procedure:      VAS US  ABI WITH/WO TBI Referring Phys: --------------------------------------------------------------------------------  Indications: Chronic right arm and leg numbness, tingling (parasthenia) Admitted              for severe hyperglycemia and AKI. High Risk         Hypertension, hyperlipidemia, Diabetes, past history of Factors:          smoking. Other Factors: Cocaine abuse, noncompliance with medication secondary to                unhoused state.  Comparison Study: Prior study done 05/22/22 Performing Technologist: Alberta Lis RVS  Examination Guidelines: A complete evaluation includes at minimum, Doppler waveform signals and systolic blood pressure reading at the level of bilateral brachial, anterior tibial, and posterior tibial arteries, when vessel segments are accessible. Bilateral testing is considered an integral part of a complete examination. Photoelectric Plethysmograph (PPG) waveforms and toe systolic pressure readings are included as required and additional duplex testing as needed. Limited examinations for reoccurring indications may be performed as noted.  ABI Findings: +---------+------------------+-----+---------+--------+ Right    Rt Pressure (mmHg)IndexWaveform Comment  +---------+------------------+-----+---------+--------+ Brachial 123                    triphasic         +---------+------------------+-----+---------+--------+ PTA      85                0.66 biphasic          +---------+------------------+-----+---------+--------+ DP       76                0.59 biphasic          +---------+------------------+-----+---------+--------+ Great Toe60                 0.47 Abnormal          +---------+------------------+-----+---------+--------+ +---------+------------------+-----+----------+-------+ Left     Lt Pressure (mmHg)IndexWaveform  Comment +---------+------------------+-----+----------+-------+ Brachial 129                    triphasic         +---------+------------------+-----+----------+-------+ PTA      77                0.60 monophasic        +---------+------------------+-----+----------+-------+ DP       87                0.67 biphasic          +---------+------------------+-----+----------+-------+ Great Toe70                0.54 Abnormal          +---------+------------------+-----+----------+-------+ +-------+-----------+-----------+------------+------------+  ABI/TBIToday's ABIToday's TBIPrevious ABIPrevious TBI +-------+-----------+-----------+------------+------------+ Right  0.66       0.47       0.79        0.26         +-------+-----------+-----------+------------+------------+ Left   0.67       0.54       0.77        absent       +-------+-----------+-----------+------------+------------+ Bilateral ABIs appear decreased compared to prior study on 05/22/22. Bilateral ABIs appear increased compared to prior study on 05/22/22.  Summary: Right: Resting right ankle-brachial index indicates moderate right lower extremity arterial disease. The right toe-brachial index is abnormal. Left: Resting left ankle-brachial index indicates moderate left lower extremity arterial disease. The left toe-brachial index is abnormal. *See table(s) above for measurements and observations.  Electronically signed by Lonni Gaskins MD on 09/29/2023 at 3:12:16 PM.    Final       LOS: 3 days    Elgin Lam, MD [facility] 09/30/2023, 2:59 PM   If 7PM-7AM, please contact night-coverage [email or web address]

## 2023-09-30 NOTE — Plan of Care (Signed)

## 2023-10-01 ENCOUNTER — Other Ambulatory Visit (HOSPITAL_COMMUNITY): Payer: Self-pay

## 2023-10-01 ENCOUNTER — Other Ambulatory Visit (HOSPITAL_BASED_OUTPATIENT_CLINIC_OR_DEPARTMENT_OTHER): Payer: Self-pay

## 2023-10-01 ENCOUNTER — Telehealth: Payer: Self-pay | Admitting: Family Medicine

## 2023-10-01 DIAGNOSIS — E1165 Type 2 diabetes mellitus with hyperglycemia: Secondary | ICD-10-CM | POA: Diagnosis not present

## 2023-10-01 LAB — GLUCOSE, CAPILLARY
Glucose-Capillary: 260 mg/dL — ABNORMAL HIGH (ref 70–99)
Glucose-Capillary: 345 mg/dL — ABNORMAL HIGH (ref 70–99)

## 2023-10-01 MED ORDER — INSULIN PEN NEEDLE 31G X 5 MM MISC
1.0000 | 0 refills | Status: DC
  Filled 2023-10-01: qty 100, 30d supply, fill #0

## 2023-10-01 MED ORDER — ATORVASTATIN CALCIUM 40 MG PO TABS
40.0000 mg | ORAL_TABLET | Freq: Every day | ORAL | 0 refills | Status: DC
Start: 2023-10-01 — End: 2023-11-01
  Filled 2023-10-01: qty 90, 90d supply, fill #0

## 2023-10-01 MED ORDER — ACCU-CHEK GUIDE TEST VI STRP
ORAL_STRIP | 1 refills | Status: DC
  Filled 2023-10-01: qty 100, 30d supply, fill #0

## 2023-10-01 MED ORDER — LANTUS SOLOSTAR 100 UNIT/ML ~~LOC~~ SOPN
30.0000 [IU] | PEN_INJECTOR | Freq: Every day | SUBCUTANEOUS | 0 refills | Status: DC
  Filled 2023-10-01: qty 15, 50d supply, fill #0

## 2023-10-01 MED ORDER — INSULIN GLARGINE-YFGN 100 UNIT/ML ~~LOC~~ SOLN
30.0000 [IU] | Freq: Every day | SUBCUTANEOUS | Status: DC
  Administered 2023-10-01: 30 [IU] via SUBCUTANEOUS
  Filled 2023-10-01: qty 0.3

## 2023-10-01 MED ORDER — AMLODIPINE BESYLATE 10 MG PO TABS: 10.0000 mg | ORAL_TABLET | Freq: Every day | ORAL | 0 refills | Status: DC

## 2023-10-01 MED ORDER — ACCU-CHEK SOFTCLIX LANCETS MISC
1 refills | Status: DC
  Filled 2023-10-01: qty 100, 30d supply, fill #0

## 2023-10-01 MED ORDER — AMLODIPINE BESYLATE 10 MG PO TABS
10.0000 mg | ORAL_TABLET | Freq: Every day | ORAL | 0 refills | Status: DC
  Filled 2023-10-01: qty 90, 90d supply, fill #0

## 2023-10-01 MED ORDER — ATORVASTATIN CALCIUM 40 MG PO TABS: 40.0000 mg | ORAL_TABLET | Freq: Every day | ORAL | 0 refills | Status: DC

## 2023-10-01 MED ORDER — GABAPENTIN 300 MG PO CAPS
300.0000 mg | ORAL_CAPSULE | Freq: Three times a day (TID) | ORAL | 0 refills | Status: DC
  Filled 2023-10-01: qty 270, 90d supply, fill #0

## 2023-10-01 MED ORDER — METFORMIN HCL 500 MG PO TABS: 500.0000 mg | ORAL_TABLET | Freq: Two times a day (BID) | ORAL | 0 refills | Status: DC

## 2023-10-01 MED ORDER — ACCU-CHEK GUIDE W/DEVICE KIT
PACK | 0 refills | Status: DC
Start: 2023-10-01 — End: 2023-10-20
  Filled 2023-10-01: qty 1, 30d supply, fill #0

## 2023-10-01 MED ORDER — METFORMIN HCL 500 MG PO TABS
500.0000 mg | ORAL_TABLET | Freq: Two times a day (BID) | ORAL | 0 refills | Status: DC
  Filled 2023-10-01: qty 180, 90d supply, fill #0

## 2023-10-01 NOTE — Telephone Encounter (Signed)
 Copied from CRM [id number]. Topic: Appointments - Scheduling Inquiry for Clinic >> Oct 01, 2023 11:46 AM Treva T wrote:  Reason for CRM: Received call from patient, requesting to schedule a hospital follow up. Patient states was discharged today, 10/01/23, was seen at [facility] for elevated diabetes.   Attempted to schedule appointment for hospital follow up, next available appointment is in September, per decision tree patient needs to be seen within 14 days.  Please contact patient to schedule appointment. Can be reached at [phone].  Thank you

## 2023-10-01 NOTE — Progress Notes (Signed)
 TOC meds in a secure bag delivered to patient in lobby- waiting on Safe Transport. Alm at [employer] of patient in lobby

## 2023-10-01 NOTE — TOC Transition Note (Addendum)
 Transition of Care [facility]) - Discharge Note   Patient Details  Name: David Wyatt MRN: [id number] Date of Birth: Feb 21, 1961  Transition of Care [facility]) CM/SW Contact:  Doneta Glenys DASEN, RN Phone Number: 10/01/2023, 2:52 PM   Clinical Narrative:    Per MD patient is medical ready for discharge. Discharge medications were delivered to patient. Safe transport arranged and Warehouse manager and Release of Liability signed. Transportation to [facility] [phone]) [address], [place].  CM signing off   Final next level of care:  ([address]) Barriers to Discharge: Barriers Resolved   Patient Goals and CMS Choice Patient states their goals for this hospitalization and ongoing recovery are:: Find housing CMS Medicare.gov Compare Post Acute Care list provided to::  (NA) Choice offered to / list presented to : NA [place] ownership interest in [facility].provided to:: Parent NA    Discharge Placement                  Name of family member notified: [facility] Patient and family notified of of transfer: 10/01/23  Discharge Plan and Services Additional resources added to the After Visit Summary for                  DME Arranged: N/A DME Agency: NA       HH Arranged: NA HH Agency: NA        Social Drivers of Health (SDOH) Interventions SDOH Screenings   Food Insecurity: Food Insecurity Present (09/27/2023)  Housing: High Risk (09/27/2023)  Transportation Needs: Unmet Transportation Needs (09/27/2023)  Utilities: At Risk (09/27/2023)  Alcohol Screen: Low Risk  (05/19/2023)  Depression (PHQ2-9): Low Risk  (09/17/2022)  Recent Concern: Depression (PHQ2-9) - Medium Risk (07/25/2022)  Tobacco Use: High Risk (09/28/2023)     Readmission Risk Interventions     No data to display

## 2023-10-01 NOTE — Progress Notes (Signed)
 TOC meds in a secure bag delivered to pt in room

## 2023-10-01 NOTE — Discharge Summary (Signed)
 Physician Discharge Summary  David Wyatt FMW:[id number] DOB: 01-11-1961 DOA: 09/27/2023  PCP: Delbert Clam, MD  Admit date: 09/27/2023 Discharge date: 10/01/2023  Admitted From: Home Disposition: Home  Recommendations for Outpatient Follow-up:  Follow up with PCP in 1-2 weeks  Home Health: None Equipment/Devices: None  Discharge Condition: Stable CODE STATUS: Full Diet recommendation: Diabetic low-carb low-fat low-salt diet  Brief/Interim Summary: David Wyatt is a 63 y.o. male with a history of homelessness, diabetes mellitus type 2, neuropathy.  Patient presented secondary to right arm and leg tingling and found to have severe hyperglycemia. Patient started on IV insulin  for management with improvement. Patient transitioned to subcutaneous insulin .  Patient admitted as above with profound hyperglycemia now resolved, continue new insulin  regimen as outlined below, medications provided at discharge given patient's current living situation.  Recommend close follow-up with PCP in 1 to 2 weeks to ensure stable glucose levels and symptoms.  Lengthy discussion in regards to patient's diet and medication compliance.  He remains markedly high risk for readmission given his social situation and need for insulin .  Discharge Diagnoses:  Principal Problem:   Hyperglycemia due to diabetes mellitus (HCC)  Acute symptomatic hyperglycemia Anion gap metabolic acidosis, POA Ketosis Profound hyperglycemia, glucose 764 at intake, anion gap of 16 Resolved with supportive care and increase insulin    Pseudohyponatremia Resolved   AKI Resolved, peak 1.96, currently within normal limits   Diabetes mellitus, type 2 - Uncontrolled with hyperglycemia. Hemoglobin A1C of 13.9%.  - Complicated by patient's ability to refrigerate medication. - Insulin  provided at discharge   Primary hypertension -Continue amlodipine    PAD Moderate disease noted bilaterally with symptoms consistent with  claudication.  Discussed with on-call vascular surgery; since no current wound, recommendation for outpatient follow-up.   Peripheral neuropathy -CT head negative for stroke. - Noted claudication as above -Continue gabapentin    Discharge Instructions  Discharge Instructions     Call MD for:  difficulty breathing, headache or visual disturbances   Complete by: As directed    Call MD for:  extreme fatigue   Complete by: As directed    Call MD for:  hives   Complete by: As directed    Call MD for:  persistant dizziness or light-headedness   Complete by: As directed    Call MD for:  persistant nausea and vomiting   Complete by: As directed    Call MD for:  severe uncontrolled pain   Complete by: As directed    Call MD for:  temperature >100.4   Complete by: As directed    Diet Carb Modified   Complete by: As directed    Low salt   Increase activity slowly   Complete by: As directed       Allergies as of 10/01/2023       Reactions   Lisinopril  Other (See Comments), Swelling   Lip Swelling, angioedema angioedema  Lip Swelling  Lip Swelling, angioedema        Medication List     STOP taking these medications    cyclobenzaprine  10 MG tablet Commonly known as: FLEXERIL    DULoxetine  60 MG capsule Commonly known as: Cymbalta    glipiZIDE  10 MG tablet Commonly known as: GLUCOTROL    hydrOXYzine  25 MG tablet Commonly known as: ATARAX    multivitamin with minerals Tabs tablet   naproxen  250 MG tablet Commonly known as: NAPROSYN    NovoLOG  FlexPen 100 UNIT/ML FlexPen Generic drug: insulin  aspart   traZODone  50 MG tablet Commonly known as: DESYREL    Trulicity   1.5 MG/0.5ML Soaj Generic drug: Dulaglutide        TAKE these medications    Accu-Chek Guide Test test strip Generic drug: glucose blood Use as instructed to check blood sugar three times daily What changed: additional instructions   Accu-Chek Guide w/Device Kit Use to test blood sugar 3 times  daily   Accu-Chek Softclix Lancets lancets Use to check blood sugar three times daily as directed What changed:  additional instructions Another medication with the same name was removed. Continue taking this medication, and follow the directions you see here.   amLODipine  10 MG tablet Commonly known as: NORVASC  Take 1 tablet (10 mg total) by mouth daily.   atorvastatin  40 MG tablet Commonly known as: LIPITOR Take 1 tablet (40 mg total) by mouth daily.   gabapentin  300 MG capsule Commonly known as: NEURONTIN  Take 1 capsule (300 mg total) by mouth 3 (three) times daily.   Insulin  Pen Needle 31G X 5 MM Misc use to inject insulin  as directed What changed: medication strength   Lantus  SoloStar 100 UNIT/ML Solostar Pen Generic drug: insulin  glargine Inject 30 Units into the skin at bedtime. What changed: how much to take   metFORMIN  500 MG tablet Commonly known as: GLUCOPHAGE  Take 1 tablet (500 mg total) by mouth 2 (two) times daily with a meal. What changed:  medication strength how much to take when to take this        Follow-up Information     Pearline Norman RAMAN, MD. Schedule an appointment as soon as possible for a visit.   Specialty: Vascular Surgery Why: peripheral artery disease Contact information: [address] [place] [address] [phone]                Allergies  Allergen Reactions   Lisinopril  Other (See Comments) and Swelling    Lip Swelling, angioedema  angioedema  Lip Swelling  Lip Swelling, angioedema    Consultations: None  Procedures/Studies: VAS US  ABI WITH/WO TBI Result Date: 09/29/2023  LOWER EXTREMITY DOPPLER STUDY Patient Name:  David Wyatt  Date of Exam:   09/29/2023 Medical Rec #: [id number]      Accession #:    [id number] Date of Birth: 1960/05/13       Patient Gender: M Patient Age:   1 years Exam Location:  [facility] Procedure:      VAS US  ABI WITH/WO TBI Referring Phys:  --------------------------------------------------------------------------------  Indications: Chronic right arm and leg numbness, tingling (parasthenia) Admitted              for severe hyperglycemia and AKI. High Risk         Hypertension, hyperlipidemia, Diabetes, past history of Factors:          smoking. Other Factors: Cocaine abuse, noncompliance with medication secondary to                unhoused state.  Comparison Study: Prior study done 05/22/22 Performing Technologist: Alberta Lis RVS  Examination Guidelines: A complete evaluation includes at minimum, Doppler waveform signals and systolic blood pressure reading at the level of bilateral brachial, anterior tibial, and posterior tibial arteries, when vessel segments are accessible. Bilateral testing is considered an integral part of a complete examination. Photoelectric Plethysmograph (PPG) waveforms and toe systolic pressure readings are included as required and additional duplex testing as needed. Limited examinations for reoccurring indications may be performed as noted.  ABI Findings: +---------+------------------+-----+---------+--------+ Right    Rt Pressure (mmHg)IndexWaveform Comment  +---------+------------------+-----+---------+--------+ Brachial 123  triphasic         +---------+------------------+-----+---------+--------+ PTA      85                0.66 biphasic          +---------+------------------+-----+---------+--------+ DP       76                0.59 biphasic          +---------+------------------+-----+---------+--------+ Great Toe60                0.47 Abnormal          +---------+------------------+-----+---------+--------+ +---------+------------------+-----+----------+-------+ Left     Lt Pressure (mmHg)IndexWaveform  Comment +---------+------------------+-----+----------+-------+ Brachial 129                    triphasic          +---------+------------------+-----+----------+-------+ PTA      77                0.60 monophasic        +---------+------------------+-----+----------+-------+ DP       87                0.67 biphasic          +---------+------------------+-----+----------+-------+ Great Toe70                0.54 Abnormal          +---------+------------------+-----+----------+-------+ +-------+-----------+-----------+------------+------------+ ABI/TBIToday's ABIToday's TBIPrevious ABIPrevious TBI +-------+-----------+-----------+------------+------------+ Right  0.66       0.47       0.79        0.26         +-------+-----------+-----------+------------+------------+ Left   0.67       0.54       0.77        absent       +-------+-----------+-----------+------------+------------+ Bilateral ABIs appear decreased compared to prior study on 05/22/22. Bilateral ABIs appear increased compared to prior study on 05/22/22.  Summary: Right: Resting right ankle-brachial index indicates moderate right lower extremity arterial disease. The right toe-brachial index is abnormal. Left: Resting left ankle-brachial index indicates moderate left lower extremity arterial disease. The left toe-brachial index is abnormal. *See table(s) above for measurements and observations.  Electronically signed by Lonni Gaskins MD on 09/29/2023 at 3:12:16 PM.    Final    CT Head Wo Contrast Result Date: 09/27/2023 CLINICAL DATA:  63 year old male with headache, right extremity tingling. EXAM: CT HEAD WITHOUT CONTRAST TECHNIQUE: Contiguous axial images were obtained from the base of the skull through the vertex without intravenous contrast. RADIATION DOSE REDUCTION: This exam was performed according to the departmental dose-optimization program which includes automated exposure control, adjustment of the mA and/or kV according to patient size and/or use of iterative reconstruction technique. COMPARISON:  Brain MRI 05/06/2017.   Head CT 07/12/2022. FINDINGS: Brain: Stable cerebral volume. No midline shift, ventriculomegaly, mass effect, evidence of mass lesion, intracranial hemorrhage or evidence of cortically based acute infarction. Patchy and confluent bilateral cerebral white matter hypodensity. Some deep white matter capsule involvement. Mild heterogeneity in the deep gray nuclei. Subtle chronic left cerebellar infarct(s). Vascular: No suspicious intracranial vascular hyperdensity. Calcified atherosclerosis at the skull base. Skull: Intact.  No acute osseous abnormality identified. Sinuses/Orbits: Visualized paranasal sinuses and mastoids are clear. Other: Visualized orbits and scalp soft tissues are within normal limits. IMPRESSION: 1. No acute intracranial abnormality. 2. Stable non contrast CT appearance of moderately advanced chronic small vessel disease. Electronically Signed  By: VEAR Hurst M.D.   On: 09/27/2023 11:15     Subjective: No acute issues or events overnight denies nausea vomiting diarrhea constipation he appears show chest pain  Discharge Exam: Vitals:   10/01/23 0620 10/01/23 1222  BP: (!) 140/85 120/82  Pulse: (!) 57 78  Resp: 18 18  Temp: (!) 97.5 F (36.4 C) (!) 97.5 F (36.4 C)  SpO2: 99% 100%   Vitals:   09/30/23 0533 09/30/23 1248 10/01/23 0620 10/01/23 1222  BP: (!) 135/97 138/87 (!) 140/85 120/82  Pulse: 66 71 (!) 57 78  Resp: 14  18 18   Temp: 97.8 F (36.6 C) 98.1 F (36.7 C) (!) 97.5 F (36.4 C) (!) 97.5 F (36.4 C)  TempSrc: Oral  Oral Oral  SpO2: 95% 97% 99% 100%  Weight:      Height:        General: Pt is alert, awake, not in acute distress Cardiovascular: RRR, S1/S2 +, no rubs, no gallops Respiratory: CTA bilaterally, no wheezing, no rhonchi Abdominal: Soft, NT, ND, bowel sounds + Extremities: no edema, no cyanosis  The results of significant diagnostics from this hospitalization (including imaging, microbiology, ancillary and laboratory) are listed below for  reference.     Microbiology: Recent Results (from the past 240 hours)  MRSA Next Gen by PCR, Nasal     Status: None   Collection Time: 09/27/23  3:30 PM   Specimen: Nasal Mucosa; Nasal Swab  Result Value Ref Range Status   MRSA by PCR Next Gen NOT DETECTED NOT DETECTED Final    Comment: (NOTE) The GeneXpert MRSA Assay (FDA approved for NASAL specimens only), is one component of a comprehensive MRSA colonization surveillance program. It is not intended to diagnose MRSA infection nor to guide or monitor treatment for MRSA infections. Test performance is not FDA approved in patients less than 86 years old. Performed at [facility], [address], [place], [place] [address]      Labs: BNP (last 3 results) No results for input(s): BNP in the last 8760 hours. Basic Metabolic Panel: Recent Labs  Lab 09/27/23 1044 09/27/23 1341 09/27/23 2018 09/28/23 0326  NA 125* 132* 137 139  K 4.6 4.1 3.5 3.9  CL 87* 96* 101 106  CO2 22 22 24 25   GLUCOSE 764* 481* 143* 133*  BUN 29* 26* 20 20  CREATININE 1.96* 1.73* 1.07 1.17  CALCIUM  9.6 8.9 9.0 9.1   Liver Function Tests: No results for input(s): AST, ALT, ALKPHOS, BILITOT, PROT, ALBUMIN in the last 168 hours. No results for input(s): LIPASE, AMYLASE in the last 168 hours. No results for input(s): AMMONIA in the last 168 hours. CBC: Recent Labs  Lab 09/27/23 1043 09/28/23 0326 09/28/23 1411  WBC 7.3 7.6  --   HGB 15.7 13.1 13.8  HCT 47.4 40.3 41.6  MCV 92.8 94.2  --   PLT 374 332  --    Cardiac Enzymes: No results for input(s): CKTOTAL, CKMB, CKMBINDEX, TROPONINI in the last 168 hours. BNP: Invalid input(s): POCBNP CBG: Recent Labs  Lab 09/30/23 1244 09/30/23 1631 09/30/23 2013 10/01/23 0735 10/01/23 1202  GLUCAP 284* 240* 209* 260* 345*   D-Dimer No results for input(s): DDIMER in the last 72 hours. Hgb A1c No results for input(s): HGBA1C in the last 72  hours. Lipid Profile No results for input(s): CHOL, HDL, LDLCALC, TRIG, CHOLHDL, LDLDIRECT in the last 72 hours. Thyroid  function studies No results for input(s): TSH, T4TOTAL, T3FREE, THYROIDAB in the  last 72 hours.  Invalid input(s): FREET3 Anemia work up No results for input(s): VITAMINB12, FOLATE, FERRITIN, TIBC, IRON, RETICCTPCT in the last 72 hours. Urinalysis    Component Value Date/Time   COLORURINE STRAW (A) 09/27/2023 1149   APPEARANCEUR CLEAR 09/27/2023 1149   LABSPEC 1.026 09/27/2023 1149   PHURINE 5.0 09/27/2023 1149   GLUCOSEU >=500 (A) 09/27/2023 1149   HGBUR NEGATIVE 09/27/2023 1149   BILIRUBINUR NEGATIVE 09/27/2023 1149   BILIRUBINUR negative 03/26/2022 1531   BILIRUBINUR small 02/16/2019 1618   KETONESUR NEGATIVE 09/27/2023 1149   PROTEINUR NEGATIVE 09/27/2023 1149   UROBILINOGEN 0.2 03/26/2022 1531   UROBILINOGEN 1.0 10/12/2012 0149   NITRITE NEGATIVE 09/27/2023 1149   LEUKOCYTESUR NEGATIVE 09/27/2023 1149   Sepsis Labs Recent Labs  Lab 09/27/23 1043 09/28/23 0326  WBC 7.3 7.6   Microbiology Recent Results (from the past 240 hours)  MRSA Next Gen by PCR, Nasal     Status: None   Collection Time: 09/27/23  3:30 PM   Specimen: Nasal Mucosa; Nasal Swab  Result Value Ref Range Status   MRSA by PCR Next Gen NOT DETECTED NOT DETECTED Final    Comment: (NOTE) The GeneXpert MRSA Assay (FDA approved for NASAL specimens only), is one component of a comprehensive MRSA colonization surveillance program. It is not intended to diagnose MRSA infection nor to guide or monitor treatment for MRSA infections. Test performance is not FDA approved in patients less than 46 years old. Performed at [facility], [address] [place], [place] [address]      Time coordinating discharge: Over 30 minutes  SIGNED:   Elsie JAYSON [place], DO [facility] 10/01/2023, 1:06 PM Pager   If 7PM-7AM, please  contact night-coverage [email or web address]

## 2023-10-01 NOTE — Progress Notes (Signed)
 Physical Therapy Treatment Patient Details Name: David Wyatt MRN: [id number] DOB: 06/07/1960 Today's Date: 10/01/2023   History of Present Illness Debra Calabretta is a 63 y.o. male with a history of homelessness, diabetes mellitus type 2, neuropathy.  Patient presented to hospital 09/27/2023 secondary to right arm and leg tingling and found to have severe hyperglycemia. Patient started on IV insulin  for management with improvement. Patient transitioned to subcutaneous insulin .    PT Comments   Pt admitted with above diagnosis.  Pt currently with functional limitations due to the deficits listed below (see PT Problem List). Pt standing in room when PT arrived, no AD nor footwear donned. Pt ed provided on safety, fall risk prevention and use of personal rollator for all amb tasks. Pt reported he is d/c today. Pt motivated to get dressed and agreeable to gait training in hallway with rollator 150 feet, pt required S and min cues for safety and Rollator management. Pt left with OT standing in hallway with rollator. Pt will benefit from acute skilled PT to increase their independence and safety with mobility to allow discharge.      If plan is discharge home, recommend the following: A little help with walking and/or transfers;A little help with bathing/dressing/bathroom;Assistance with cooking/housework;Assist for transportation   Can travel by private vehicle        Equipment Recommendations  None recommended by PT    Recommendations for Other Services       Precautions / Restrictions Precautions Precautions: Fall Restrictions Weight Bearing Restrictions Per Provider Order: No     Mobility  Bed Mobility               General bed mobility comments: pt standing between bed and bench in hospital room no AD no gripper socks or shoes donned, pt ed provided on importance of proper foot wear for fall risk prevention and provided with gripper socks and pt able to don shoes.     Transfers Overall transfer level: Needs assistance Equipment used: Rollator (4 wheels), None Transfers: Sit to/from Stand Sit to Stand: Supervision           General transfer comment: pt demonstrates absent recall for rollator brake management and instability with transfer tasks no AD    Ambulation/Gait Ambulation/Gait assistance: Supervision Gait Distance (Feet): 150 Feet Assistive device: Rollator (4 wheels) Gait Pattern/deviations: Step-through pattern, Narrow base of support, Trunk flexed Gait velocity: decreased     General Gait Details: min cues for safety, posture and attention to R LE for improved kinethetic awareness/placement and clearance   Stairs             Wheelchair Mobility     Tilt Bed    Modified Rankin (Stroke Patients Only)       Balance Overall balance assessment: Needs assistance Sitting-balance support: Feet supported Sitting balance-Leahy Scale: Good     Standing balance support: During functional activity, Bilateral upper extremity supported, Reliant on assistive device for balance Standing balance-Leahy Scale: Fair Standing balance comment: pt electing to amb short distances in personal room while performing dressing tasks and gathering personal items with trunk flexion intermittent use of external support and limited R foot clearance pt strongly encouraged to use personal rollator for all gait tasks                            Communication Communication Communication: No apparent difficulties  Cognition Arousal: Alert Behavior During Therapy: [facility] for tasks assessed/performed  PT - Cognitive impairments: No apparent impairments                       PT - Cognition Comments: pt states he is a little confused, however able to communicate and follow commands appropriately Following commands: Intact      Cueing Cueing Techniques: Verbal cues  Exercises      General Comments        Pertinent  Vitals/Pain Pain Assessment Pain Assessment: Faces Faces Pain Scale: Hurts a little bit Pain Location: R  LE Pain Descriptors / Indicators: Discomfort Pain Intervention(s): Limited activity within patient's tolerance, Monitored during session    Home Living                          Prior Function            PT Goals (current goals can now be found in the care plan section) Acute Rehab PT Goals Patient Stated Goal: to be able to get stronger PT Goal Formulation: With patient Time For Goal Achievement: 10/20/23 Potential to Achieve Goals: Good Progress towards PT goals: Progressing toward goals    Frequency    Min 3X/week      PT Plan      Co-evaluation              AM-PAC PT 6 Clicks Mobility   Outcome Measure  Help needed turning from your back to your side while in a flat bed without using bedrails?: None Help needed moving from lying on your back to sitting on the side of a flat bed without using bedrails?: None Help needed moving to and from a bed to a chair (including a wheelchair)?: A Little Help needed standing up from a chair using your arms (e.g., wheelchair or bedside chair)?: A Little Help needed to walk in hospital room?: A Little Help needed climbing 3-5 steps with a railing? : A Lot 6 Click Score: 19    End of Session Equipment Utilized During Treatment: Gait belt Activity Tolerance: Patient tolerated treatment well Patient left: Other (comment) (PT transitioned pt to OT when returning to room s/p gait in hallway) Nurse Communication: Mobility status PT Visit Diagnosis: Unsteadiness on feet (R26.81);Other abnormalities of gait and mobility (R26.89);Repeated falls (R29.6);Muscle weakness (generalized) (M62.81);Difficulty in walking, not elsewhere classified (R26.2);Pain Pain - Right/Left: Right Pain - part of body: Shoulder;Arm;Knee;Leg;Hip     Time: 8880-8870 PT Time Calculation (min) (ACUTE ONLY): 10 min  Charges:      PT  General Charges $$ ACUTE PT VISIT: 1 Visit                     [place], PT Acute Rehab    [place] VEAR Drone 10/01/2023, 12:40 PM

## 2023-10-01 NOTE — Telephone Encounter (Signed)
 Patients brother called back stating he would be seeing his brother later and will have him call us  back to schedule an appointment for a hospital follow up.

## 2023-10-01 NOTE — Progress Notes (Signed)
 Occupational Therapy Treatment Patient Details Name: David Wyatt MRN: [id number] DOB: [birth year] Today's Date: 10/01/2023   History of present illness David Wyatt is a 63 y.o. male with a history of homelessness, diabetes mellitus type 2, neuropathy.  Patient presented to hospital 09/27/2023 secondary to right arm and leg tingling and found to have severe hyperglycemia. Patient started on IV insulin  for management with improvement. Patient transitioned to subcutaneous insulin .   OT comments  Pt making good progress with functional goals. Pt completing ADLs, toileting tasks, toilet and shower transfers with Mod I/Sup, using rollater with Sup and cues for lock brakes/correct hand placement. OT will continue to follow acutely      If plan is discharge home, recommend the following:  A little help with walking and/or transfers;Assistance with cooking/housework;Assist for transportation;Help with stairs or ramp for entrance   Equipment Recommendations  Other (comment) (new rollater repair brakes for [employer])    Recommendations for Other Services      Precautions / Restrictions Precautions Precautions: Fall Restrictions Weight Bearing Restrictions Per Provider Order: No       Mobility Bed Mobility               General bed mobility comments: pt walking back to room with rollater with PT upon arrival    Transfers Overall transfer level: Needs assistance Equipment used: Rollator (4 wheels) Transfers: Sit to/from Stand Sit to Stand: Supervision           General transfer comment: min verbal cues for hand placement, locking brakes; pt stating that brakes on rollator don't lock well, pt is able to demonstrate ability to lock/unlock, however poor recall for during sit-stand/stand-sit transitions     Balance Overall balance assessment: Needs assistance Sitting-balance support: Feet supported Sitting balance-Leahy Scale: Good     Standing balance support: During  functional activity, Bilateral upper extremity supported, Reliant on assistive device for balance Standing balance-Leahy Scale: Poor Standing balance comment: no reports of dizziness                           ADL either performed or assessed with clinical judgement   ADL Overall ADL's : Needs assistance/impaired     Grooming: Wash/dry hands;Wash/dry face;Oral care;Contact guard assist;Standing       Lower Body Bathing: Modified independent;Sit to/from stand       Lower Body Dressing: Modified independent;Sit to/from stand   Toilet Transfer: Supervision/safety;Ambulation;Rollator (4 wheels);Cueing for safety;Regular Toilet;Grab bars   Toileting- Clothing Manipulation and Hygiene: Modified independent;Sit to/from stand   Tub/ Shower Transfer: Supervision/safety;Ambulation;Rollator (4 wheels);Grab bars;BSC/3in1   Functional mobility during ADLs: Supervision/safety;Rollator (4 wheels);Cueing for safety      Extremity/Trunk Assessment Upper Extremity Assessment Upper Extremity Assessment: Overall WFL for tasks assessed;Right hand dominant   Lower Extremity Assessment Lower Extremity Assessment: Defer to PT evaluation   Cervical / Trunk Assessment Cervical / Trunk Assessment: Normal    Vision Ability to See in Adequate Light: 0 Adequate Patient Visual Report: No change from baseline     Perception     Praxis     Communication Communication Communication: No apparent difficulties   Cognition Arousal: Alert Behavior During Therapy: WFL for tasks assessed/performed Cognition: No apparent impairments                               Following commands: Intact        Cueing  Cueing Techniques: Verbal cues  Exercises      Shoulder Instructions       General Comments      Pertinent Vitals/ Pain       Pain Assessment Pain Assessment: Faces Faces Pain Scale: Hurts a little bit Pain Location: R  LE Pain Descriptors / Indicators:  Discomfort Pain Intervention(s): Monitored during session, Repositioned  Home Living                                          Prior Functioning/Environment              Frequency  Min 2X/week        Progress Toward Goals  OT Goals(current goals can now be found in the care plan section)  Progress towards OT goals: Progressing toward goals     Plan      Co-evaluation                 AM-PAC OT 6 Clicks Daily Activity     Outcome Measure   Help from another person eating meals?: None Help from another person taking care of personal grooming?: None Help from another person toileting, which includes using toliet, bedpan, or urinal?: None Help from another person bathing (including washing, rinsing, drying)?: None Help from another person to put on and taking off regular upper body clothing?: None Help from another person to put on and taking off regular lower body clothing?: None 6 Click Score: 24    End of Session Equipment Utilized During Treatment: Rollator (4 wheels);Gait belt  OT Visit Diagnosis: Unsteadiness on feet (R26.81);Ataxia, unspecified (R27.0)   Activity Tolerance Patient tolerated treatment well   Patient Left with call bell/phone within reach;in bed;Other (comment) (sitting EOB)   Nurse Communication Mobility status        Time: 8870-8843 OT Time Calculation (min): 27 min  Charges: OT General Charges $OT Visit: 1 Visit OT Treatments $Self Care/Home Management : 8-22 mins $Therapeutic Activity: 8-22 mins    [name] Loose 10/01/2023, 12:30 PM

## 2023-10-01 NOTE — Plan of Care (Signed)

## 2023-10-01 NOTE — Telephone Encounter (Signed)
 Called patient to schedule an appointment. unable to reach patient or leave voicemail.

## 2023-10-02 ENCOUNTER — Telehealth: Payer: Self-pay | Admitting: *Deleted

## 2023-10-02 NOTE — Transitions of Care (Post Inpatient/ED Visit) (Signed)
   10/02/2023  Name: David Wyatt MRN: [id number] DOB: March 09, 1961  Today's TOC FU Call Status: Today's TOC FU Call Status:: Unsuccessful Call (1st Attempt) Unsuccessful Call (1st Attempt) Date: 10/02/23  Attempted to reach the patient regarding the most recent Inpatient/ED visit.  Follow Up Plan: Additional outreach attempts will be made to reach the patient to complete the Transitions of Care (Post Inpatient/ED visit) call.   Andrea Dimes RN, BSN [place]  [facility] RN Care Manager [phone]

## 2023-10-03 ENCOUNTER — Telehealth: Payer: Self-pay

## 2023-10-03 ENCOUNTER — Telehealth: Payer: Self-pay | Admitting: *Deleted

## 2023-10-03 NOTE — Transitions of Care (Post Inpatient/ED Visit) (Signed)
   10/03/2023  Name: David Wyatt MRN: [id number] DOB: 01-28-1961  Today's TOC FU Call Status: Today's TOC FU Call Status:: Unsuccessful Call (2nd Attempt) Unsuccessful Call (2nd Attempt) Date: 10/03/23  Attempted to reach the patient regarding the most recent Inpatient/ED visit.  Follow Up Plan: Additional outreach attempts will be made to reach the patient to complete the Transitions of Care (Post Inpatient/ED visit) call.   Andrea Dimes RN, BSN [place]  [facility] RN Care Manager [phone]

## 2023-10-03 NOTE — Transitions of Care (Post Inpatient/ED Visit) (Signed)
   10/03/2023  Name: Raj Landress MRN: [id number] DOB: 1960-04-06  Today's TOC FU Call Status: Today's TOC FU Call Status:: Unsuccessful Call (3rd Attempt) Unsuccessful Call (3rd Attempt) Date: 10/03/23  Attempted to reach the patient regarding the most recent Inpatient/ED visit.  Follow Up Plan: No further outreach attempts will be made at this time. We have been unable to contact the patient.  Alan Ee, RN, BSN, CEN [employer]- Transition of Care Team.  [facility] [phone]

## 2023-10-17 ENCOUNTER — Encounter (HOSPITAL_COMMUNITY): Payer: Self-pay

## 2023-10-17 ENCOUNTER — Other Ambulatory Visit: Payer: Self-pay

## 2023-10-17 ENCOUNTER — Observation Stay (HOSPITAL_BASED_OUTPATIENT_CLINIC_OR_DEPARTMENT_OTHER)
Admission: EM | Admit: 2023-10-17 | Discharge: 2023-10-18 | Disposition: A | Discharge status: Home / Self Care | Payer: MEDICAID | Source: Home / Self Care | Attending: Emergency Medicine | Admitting: Emergency Medicine

## 2023-10-17 DIAGNOSIS — Z79899 Other long term (current) drug therapy: Secondary | ICD-10-CM | POA: Insufficient documentation

## 2023-10-17 DIAGNOSIS — I10 Essential (primary) hypertension: Secondary | ICD-10-CM | POA: Insufficient documentation

## 2023-10-17 DIAGNOSIS — Z7984 Long term (current) use of oral hypoglycemic drugs: Secondary | ICD-10-CM | POA: Insufficient documentation

## 2023-10-17 DIAGNOSIS — N179 Acute kidney failure, unspecified: Secondary | ICD-10-CM | POA: Insufficient documentation

## 2023-10-17 DIAGNOSIS — M79672 Pain in left foot: Secondary | ICD-10-CM | POA: Insufficient documentation

## 2023-10-17 DIAGNOSIS — F17210 Nicotine dependence, cigarettes, uncomplicated: Secondary | ICD-10-CM | POA: Insufficient documentation

## 2023-10-17 DIAGNOSIS — E785 Hyperlipidemia, unspecified: Secondary | ICD-10-CM | POA: Diagnosis present

## 2023-10-17 DIAGNOSIS — E7800 Pure hypercholesterolemia, unspecified: Secondary | ICD-10-CM | POA: Diagnosis not present

## 2023-10-17 DIAGNOSIS — R739 Hyperglycemia, unspecified: Principal | ICD-10-CM

## 2023-10-17 DIAGNOSIS — E1165 Type 2 diabetes mellitus with hyperglycemia: Principal | ICD-10-CM | POA: Diagnosis present

## 2023-10-17 DIAGNOSIS — E1140 Type 2 diabetes mellitus with diabetic neuropathy, unspecified: Secondary | ICD-10-CM | POA: Insufficient documentation

## 2023-10-17 LAB — CBG MONITORING, ED
Glucose-Capillary: >600 mg/dL (ref 70–99)
Glucose-Capillary: >600 mg/dL (ref 70–99)

## 2023-10-17 LAB — CBC WITH DIFFERENTIAL/PLATELET
Abs Immature Granulocytes: 0.02 K/uL (ref 0.00–0.07)
Basophils Absolute: 0.1 K/uL (ref 0.0–0.1)
Basophils Relative: 1 %
Eosinophils Absolute: 0.2 K/uL (ref 0.0–0.5)
Eosinophils Relative: 3 %
HCT: 42.9 % (ref 39.0–52.0)
Hemoglobin: 13.9 g/dL (ref 13.0–17.0)
Immature Granulocytes: 0 %
Lymphocytes Relative: 24 %
Lymphs Abs: 1.6 K/uL (ref 0.7–4.0)
MCH: 31.2 pg (ref 26.0–34.0)
MCHC: 32.4 g/dL (ref 30.0–36.0)
MCV: 96.2 fL (ref 80.0–100.0)
Monocytes Absolute: 0.5 K/uL (ref 0.1–1.0)
Monocytes Relative: 7 %
Neutro Abs: 4.3 K/uL (ref 1.7–7.7)
Neutrophils Relative %: 65 %
Platelets: 339 K/uL (ref 150–400)
RBC: 4.46 MIL/uL (ref 4.22–5.81)
RDW: 13.2 % (ref 11.5–15.5)
WBC: 6.7 K/uL (ref 4.0–10.5)
nRBC: 0 % (ref 0.0–0.2)

## 2023-10-17 LAB — BASIC METABOLIC PANEL WITH GFR
Anion gap: 14 (ref 5–15)
BUN: 17 mg/dL (ref 8–23)
CO2: 25 mmol/L (ref 22–32)
Calcium: 9.3 mg/dL (ref 8.9–10.3)
Chloride: 86 mmol/L — ABNORMAL LOW (ref 98–111)
Creatinine, Ser: 1.59 mg/dL — ABNORMAL HIGH (ref 0.61–1.24)
GFR, Estimated: 48 mL/min — ABNORMAL LOW (ref >60)
Glucose, Bld: 1074 mg/dL (ref 70–99)
Potassium: 4.7 mmol/L (ref 3.5–5.1)
Sodium: 125 mmol/L — ABNORMAL LOW (ref 135–145)

## 2023-10-17 LAB — CK: Total CK: 140 U/L (ref 49–397)

## 2023-10-17 MED ORDER — SODIUM CHLORIDE 0.9 % IV BOLUS
1000.0000 mL | Freq: Once | INTRAVENOUS | Status: AC
  Administered 2023-10-17: 1000 mL via INTRAVENOUS

## 2023-10-17 MED ORDER — INSULIN ASPART 100 UNIT/ML IJ SOLN
10.0000 [IU] | Freq: Once | INTRAMUSCULAR | Status: AC
  Administered 2023-10-17: 10 [IU] via SUBCUTANEOUS

## 2023-10-17 NOTE — ED Triage Notes (Signed)
 PT BIB EMS FOR C/O BILATERAL FEET PAIN. STATES HE WALKED IN WATER 2 DAYS AGO AND DID NOT CHANGE HIS SOCKS

## 2023-10-17 NOTE — ED Notes (Signed)
 CRITICAL LAB VALUE, [id number], PROVIDER MADE AWARE

## 2023-10-17 NOTE — ED Provider Notes (Signed)
 [place] EMERGENCY DEPARTMENT AT [facility] Provider Note   CSN: [id number] Arrival date & time: 10/17/23  8086     Patient presents with: Foot Swelling   David Wyatt is a 63 y.o. male.  He is brought in by ambulance for pain in both of his feet and his buttocks after getting wet and being out on the concrete all day long.  He has a history of poorly controlled diabetes and homelessness, substance use.  He is frequently in the emergency department for homelessness and hyperglycemia.  He was just admitted 3 weeks ago for hyperglycemia and AKI.   The history is provided by the patient.  Foot Pain This is a chronic problem. The problem occurs constantly. The problem has not changed since onset.Pertinent negatives include no chest pain, no abdominal pain, no headaches and no shortness of breath. The symptoms are aggravated by walking and standing. Nothing relieves the symptoms. He has tried nothing for the symptoms. The treatment provided no relief.       Prior to Admission medications   Medication Sig Start Date End Date Taking? Authorizing Provider  Accu-Chek Softclix Lancets lancets Use to check blood sugar three times daily as directed 10/01/23   Lue Elsie BROCKS, MD  amLODipine  (NORVASC ) 10 MG tablet Take 1 tablet (10 mg total) by mouth daily. 10/01/23   Lue Elsie BROCKS, MD  atorvastatin  (LIPITOR) 40 MG tablet Take 1 tablet (40 mg total) by mouth daily. 10/01/23   Lue Elsie BROCKS, MD  Blood Glucose Monitoring Suppl (ACCU-CHEK GUIDE) w/Device KIT Use to test blood sugar 3 times daily 10/01/23   Lue Elsie BROCKS, MD  gabapentin  (NEURONTIN ) 300 MG capsule Take 1 capsule (300 mg total) by mouth 3 (three) times daily. 10/01/23   Lue Elsie BROCKS, MD  glucose blood (ACCU-CHEK GUIDE TEST) test strip Use as instructed to check blood sugar three times daily 10/01/23   Lue Elsie BROCKS, MD  Insulin  Pen Needle 31G X 5 MM MISC use to inject insulin  as directed  10/01/23   Lue Elsie BROCKS, MD  LANTUS  SOLOSTAR 100 UNIT/ML Solostar Pen Inject 30 Units into the skin at bedtime. 10/01/23   Lue Elsie BROCKS, MD  metFORMIN  (GLUCOPHAGE ) 500 MG tablet Take 1 tablet (500 mg total) by mouth 2 (two) times daily with a meal. 10/01/23   Lue Elsie BROCKS, MD    Allergies: Lisinopril     Review of Systems  Respiratory:  Negative for shortness of breath.   Cardiovascular:  Negative for chest pain.  Gastrointestinal:  Negative for abdominal pain.  Neurological:  Negative for headaches.    Updated Vital Signs BP (!) 147/85   Pulse 82   Temp 98.8 F (37.1 C) (Oral)   Resp 18   SpO2 100%   Physical Exam Vitals and nursing note reviewed.  Constitutional:      Appearance: Normal appearance. He is well-developed.  HENT:     Head: Normocephalic and atraumatic.  Eyes:     Conjunctiva/sclera: Conjunctivae normal.  Cardiovascular:     Rate and Rhythm: Normal rate and regular rhythm.     Heart sounds: No murmur heard. Pulmonary:     Effort: Pulmonary effort is normal. No respiratory distress.     Breath sounds: Normal breath sounds.  Abdominal:     Palpations: Abdomen is soft.     Tenderness: There is no abdominal tenderness.  Musculoskeletal:        General: Tenderness present. No deformity.     Cervical  back: Neck supple.     Comments: He has some tenderness over his buttock area with some small amount of skin breakdown.  There is no open wounds or fluctuance/induration.  Both of his feet are diffusely tender with some erythema.  No significant open wounds.  Distal pulses intact.  Skin:    General: Skin is warm and dry.  Neurological:     General: No focal deficit present.     Mental Status: He is alert.     GCS: GCS eye subscore is 4. GCS verbal subscore is 5. GCS motor subscore is 6.     Motor: No weakness.     (all labs ordered are listed, but only abnormal results are displayed) Labs Reviewed  BASIC METABOLIC PANEL WITH GFR -  Abnormal; Notable for the following components:      Result Value   Sodium 125 (*)    Chloride 86 (*)    Glucose, Bld 1,074 (*)    Creatinine, Ser 1.59 (*)    GFR, Estimated 48 (*)    All other components within normal limits  CBG MONITORING, ED - Abnormal; Notable for the following components:   Glucose-Capillary >600 (*)    All other components within normal limits  CBG MONITORING, ED - Abnormal; Notable for the following components:   Glucose-Capillary >600 (*)    All other components within normal limits  CBG MONITORING, ED - Abnormal; Notable for the following components:   Glucose-Capillary 284 (*)    All other components within normal limits  CBG MONITORING, ED - Abnormal; Notable for the following components:   Glucose-Capillary 447 (*)    All other components within normal limits  CBC WITH DIFFERENTIAL/PLATELET  CK  BASIC METABOLIC PANEL WITH GFR  BASIC METABOLIC PANEL WITH GFR  BASIC METABOLIC PANEL WITH GFR  BASIC METABOLIC PANEL WITH GFR  BASIC METABOLIC PANEL WITH GFR  BETA-HYDROXYBUTYRIC ACID  OSMOLALITY    EKG: None  Radiology: No results found.   Procedures   Medications Ordered in the ED  amLODipine  (NORVASC ) tablet 10 mg (has no administration in time range)  atorvastatin  (LIPITOR) tablet 40 mg (has no administration in time range)  gabapentin  (NEURONTIN ) capsule 300 mg (300 mg Oral Given 10/18/23 0138)  enoxaparin  (LOVENOX ) injection 40 mg (has no administration in time range)  insulin  regular, human (MYXREDLIN ) 100 units/ 100 mL infusion (0 Units/hr Intravenous Hold 10/18/23 0214)  lactated ringers  infusion (0 mLs Intravenous Hold 10/18/23 0214)  dextrose  5 % in lactated ringers  infusion (has no administration in time range)  dextrose  50 % solution 0-50 mL (has no administration in time range)  potassium chloride  10 mEq in 100 mL IVPB (0 mEq Intravenous Hold 10/18/23 0303)  sodium chloride  0.9 % bolus 1,000 mL (0 mLs Intravenous Stopped 10/17/23 2300)   insulin  aspart (novoLOG ) injection 10 Units (10 Units Subcutaneous Given 10/17/23 2116)    Clinical Course as of 10/18/23 0852  Fri Oct 17, 2023  2239 Patient's blood sugar critically high at over thousand.  No anion gap.  Have given him fluids and insulin .  Elevated sugar likely due to noncompliance.  Reviewed with [facility] hospitalist Dr. Charlton who will evaluate patient for admission. [MB]    Clinical Course User Index [MB] Towana Ozell BROCKS, MD                                 Medical Decision Making Amount and/or Complexity of  Data Reviewed Labs: ordered.  Risk Prescription drug management.   This patient complains of pain in his feet and buttocks after lying on the concrete in the wet; this involves an extensive number of treatment Options and is a complaint that carries with it a high risk of complications and morbidity. The differential includes emergent foot, cellulitis, malingering, hyperglycemia  I ordered, reviewed and interpreted labs, which included CBC normal, chemistries with low sodium pseudohyponatremia, significantly elevated glucose, normal gap I ordered medication IV fluids subcu insulin  and reviewed PMP when indicated. Previous records obtained and reviewed in epic, frequently in the ED for elevated blood sugars I consulted [facility] hospitalist Dr. Charlton and discussed lab and imaging findings and discussed disposition.  Cardiac monitoring reviewed, sinus bradycardia Social determinants considered, multiple including housing transportation utility and food insecurity, tobacco use Critical Interventions: None  After the interventions stated above, I reevaluated the patient and found resting quietly in no distress Admission and further testing considered, due to his poor compliance and unstable housing patient would benefit from admission to the hospital for management and correction of his hyperglycemia.      Final diagnoses:  Hyperglycemia  Bilateral foot pain     ED Discharge Orders     None          Towana Ozell BROCKS, MD 10/18/23 [phone]

## 2023-10-17 NOTE — ED Notes (Signed)
 Upon arrival pt requested a cup of water. Was told to wait a few minutes until triage was complete. Proceeded to jump off the bed with EMS still present and drank water from the sink. Pt said foot was broken. pt then climbed into bed.

## 2023-10-18 ENCOUNTER — Inpatient Hospital Stay (HOSPITAL_COMMUNITY)
Admission: EM | Admit: 2023-10-18 | Discharge: 2023-10-19 | DRG: 638 | Discharge status: Against Medical Advice | Payer: MEDICAID | Attending: Internal Medicine | Admitting: Internal Medicine

## 2023-10-18 ENCOUNTER — Encounter (HOSPITAL_COMMUNITY): Payer: Self-pay

## 2023-10-18 ENCOUNTER — Other Ambulatory Visit: Payer: Self-pay

## 2023-10-18 ENCOUNTER — Emergency Department (HOSPITAL_COMMUNITY): Payer: MEDICAID

## 2023-10-18 ENCOUNTER — Encounter (HOSPITAL_COMMUNITY): Payer: Self-pay | Admitting: Family Medicine

## 2023-10-18 DIAGNOSIS — Z794 Long term (current) use of insulin: Secondary | ICD-10-CM | POA: Diagnosis not present

## 2023-10-18 DIAGNOSIS — Z599 Problem related to housing and economic circumstances, unspecified: Secondary | ICD-10-CM | POA: Diagnosis not present

## 2023-10-18 DIAGNOSIS — E7800 Pure hypercholesterolemia, unspecified: Secondary | ICD-10-CM | POA: Diagnosis present

## 2023-10-18 DIAGNOSIS — Z833 Family history of diabetes mellitus: Secondary | ICD-10-CM | POA: Diagnosis not present

## 2023-10-18 DIAGNOSIS — E1142 Type 2 diabetes mellitus with diabetic polyneuropathy: Secondary | ICD-10-CM | POA: Diagnosis present

## 2023-10-18 DIAGNOSIS — E1140 Type 2 diabetes mellitus with diabetic neuropathy, unspecified: Secondary | ICD-10-CM | POA: Diagnosis present

## 2023-10-18 DIAGNOSIS — Z83438 Family history of other disorder of lipoprotein metabolism and other lipidemia: Secondary | ICD-10-CM | POA: Diagnosis not present

## 2023-10-18 DIAGNOSIS — Z841 Family history of disorders of kidney and ureter: Secondary | ICD-10-CM

## 2023-10-18 DIAGNOSIS — I10 Essential (primary) hypertension: Secondary | ICD-10-CM | POA: Diagnosis present

## 2023-10-18 DIAGNOSIS — N179 Acute kidney failure, unspecified: Secondary | ICD-10-CM | POA: Diagnosis present

## 2023-10-18 DIAGNOSIS — Z91148 Patient's other noncompliance with medication regimen for other reason: Secondary | ICD-10-CM | POA: Diagnosis not present

## 2023-10-18 DIAGNOSIS — Z79899 Other long term (current) drug therapy: Secondary | ICD-10-CM | POA: Diagnosis not present

## 2023-10-18 DIAGNOSIS — Z5329 Procedure and treatment not carried out because of patient's decision for other reasons: Secondary | ICD-10-CM | POA: Diagnosis present

## 2023-10-18 DIAGNOSIS — Z888 Allergy status to other drugs, medicaments and biological substances status: Secondary | ICD-10-CM | POA: Diagnosis not present

## 2023-10-18 DIAGNOSIS — R451 Restlessness and agitation: Secondary | ICD-10-CM | POA: Diagnosis not present

## 2023-10-18 DIAGNOSIS — E1165 Type 2 diabetes mellitus with hyperglycemia: Secondary | ICD-10-CM | POA: Diagnosis present

## 2023-10-18 DIAGNOSIS — R739 Hyperglycemia, unspecified: Secondary | ICD-10-CM | POA: Diagnosis present

## 2023-10-18 DIAGNOSIS — F17210 Nicotine dependence, cigarettes, uncomplicated: Secondary | ICD-10-CM | POA: Diagnosis present

## 2023-10-18 DIAGNOSIS — M79671 Pain in right foot: Secondary | ICD-10-CM

## 2023-10-18 DIAGNOSIS — Z5900 Homelessness unspecified: Secondary | ICD-10-CM

## 2023-10-18 DIAGNOSIS — R001 Bradycardia, unspecified: Secondary | ICD-10-CM | POA: Diagnosis present

## 2023-10-18 DIAGNOSIS — Z7984 Long term (current) use of oral hypoglycemic drugs: Secondary | ICD-10-CM | POA: Diagnosis not present

## 2023-10-18 DIAGNOSIS — E860 Dehydration: Secondary | ICD-10-CM | POA: Diagnosis present

## 2023-10-18 LAB — BLOOD GAS, VENOUS
Acid-base deficit: 0.2 mmol/L (ref 0.0–2.0)
Bicarbonate: 24.8 mmol/L (ref 20.0–28.0)
O2 Saturation: 92.2 %
Patient temperature: 37
pCO2, Ven: 41 mmHg — ABNORMAL LOW (ref 44–60)
pH, Ven: 7.39 (ref 7.25–7.43)
pO2, Ven: 60 mmHg — ABNORMAL HIGH (ref 32–45)

## 2023-10-18 LAB — CBG MONITORING, ED
Glucose-Capillary: 284 mg/dL — ABNORMAL HIGH (ref 70–99)
Glucose-Capillary: 447 mg/dL — ABNORMAL HIGH (ref 70–99)
Glucose-Capillary: >600 mg/dL (ref 70–99)
Glucose-Capillary: >600 mg/dL (ref 70–99)
Glucose-Capillary: >600 mg/dL (ref 70–99)

## 2023-10-18 LAB — CBC WITH DIFFERENTIAL/PLATELET
Abs Immature Granulocytes: 0.02 K/uL (ref 0.00–0.07)
Basophils Absolute: 0 K/uL (ref 0.0–0.1)
Basophils Relative: 1 %
Eosinophils Absolute: 0.1 K/uL (ref 0.0–0.5)
Eosinophils Relative: 2 %
HCT: 38.1 % — ABNORMAL LOW (ref 39.0–52.0)
Hemoglobin: 12.3 g/dL — ABNORMAL LOW (ref 13.0–17.0)
Immature Granulocytes: 0 %
Lymphocytes Relative: 27 %
Lymphs Abs: 1.5 K/uL (ref 0.7–4.0)
MCH: 31.2 pg (ref 26.0–34.0)
MCHC: 32.3 g/dL (ref 30.0–36.0)
MCV: 96.7 fL (ref 80.0–100.0)
Monocytes Absolute: 0.4 K/uL (ref 0.1–1.0)
Monocytes Relative: 7 %
Neutro Abs: 3.5 K/uL (ref 1.7–7.7)
Neutrophils Relative %: 63 %
Platelets: 310 K/uL (ref 150–400)
RBC: 3.94 MIL/uL — ABNORMAL LOW (ref 4.22–5.81)
RDW: 13.2 % (ref 11.5–15.5)
WBC: 5.6 K/uL (ref 4.0–10.5)
nRBC: 0 % (ref 0.0–0.2)

## 2023-10-18 LAB — BASIC METABOLIC PANEL WITH GFR
Anion gap: 14 (ref 5–15)
BUN: 15 mg/dL (ref 8–23)
CO2: 23 mmol/L (ref 22–32)
Calcium: 8.7 mg/dL — ABNORMAL LOW (ref 8.9–10.3)
Chloride: 91 mmol/L — ABNORMAL LOW (ref 98–111)
Creatinine, Ser: 1.83 mg/dL — ABNORMAL HIGH (ref 0.61–1.24)
GFR, Estimated: 41 mL/min — ABNORMAL LOW (ref >60)
Glucose, Bld: 971 mg/dL (ref 70–99)
Potassium: 4.6 mmol/L (ref 3.5–5.1)
Sodium: 128 mmol/L — ABNORMAL LOW (ref 135–145)

## 2023-10-18 LAB — URINALYSIS, ROUTINE W REFLEX MICROSCOPIC
Bacteria, UA: NONE SEEN
Bilirubin Urine: NEGATIVE
Glucose, UA: >=500 mg/dL — AB
Hgb urine dipstick: NEGATIVE
Ketones, ur: NEGATIVE mg/dL
Leukocytes,Ua: NEGATIVE
Nitrite: NEGATIVE
Protein, ur: NEGATIVE mg/dL
Specific Gravity, Urine: 1.025 (ref 1.005–1.030)
pH: 6 (ref 5.0–8.0)

## 2023-10-18 MED ORDER — CHLORHEXIDINE GLUCONATE CLOTH 2 % EX PADS: 6.0000 | MEDICATED_PAD | Freq: Every day | CUTANEOUS | Status: DC

## 2023-10-18 MED ORDER — LACTATED RINGERS IV SOLN: INTRAVENOUS | Status: DC

## 2023-10-18 MED ORDER — DEXTROSE 50 % IV SOLN: 0.0000 mL | INTRAVENOUS | Status: DC | PRN

## 2023-10-18 MED ORDER — SODIUM CHLORIDE 0.9 % IV BOLUS: 1000.0000 mL | Freq: Once | INTRAVENOUS | Status: DC

## 2023-10-18 MED ORDER — DEXTROSE IN LACTATED RINGERS 5 % IV SOLN: INTRAVENOUS | Status: DC

## 2023-10-18 MED ORDER — LACTATED RINGERS IV BOLUS
1000.0000 mL | Freq: Once | INTRAVENOUS | Status: AC
  Administered 2023-10-18: 1000 mL via INTRAVENOUS

## 2023-10-18 MED ORDER — INSULIN REGULAR(HUMAN) IN NACL 100-0.9 UT/100ML-% IV SOLN
INTRAVENOUS | Status: DC
  Administered 2023-10-18: 10 [IU]/h via INTRAVENOUS
  Filled 2023-10-18: qty 100

## 2023-10-18 MED ORDER — AMLODIPINE BESYLATE 5 MG PO TABS: 10.0000 mg | ORAL_TABLET | Freq: Every day | ORAL | Status: DC

## 2023-10-18 MED ORDER — INSULIN REGULAR(HUMAN) IN NACL 100-0.9 UT/100ML-% IV SOLN: INTRAVENOUS | Status: DC

## 2023-10-18 MED ORDER — POLYETHYLENE GLYCOL 3350 17 G PO PACK: 17.0000 g | PACK | Freq: Every day | ORAL | Status: DC | PRN

## 2023-10-18 MED ORDER — ATORVASTATIN CALCIUM 40 MG PO TABS: 40.0000 mg | ORAL_TABLET | Freq: Every day | ORAL | Status: DC

## 2023-10-18 MED ORDER — MELATONIN 5 MG PO TABS: 5.0000 mg | ORAL_TABLET | Freq: Every evening | ORAL | Status: DC | PRN

## 2023-10-18 MED ORDER — POTASSIUM CHLORIDE 10 MEQ/100ML IV SOLN
10.0000 meq | INTRAVENOUS | Status: AC
  Administered 2023-10-18: 10 meq via INTRAVENOUS
  Filled 2023-10-18: qty 100

## 2023-10-18 MED ORDER — PROCHLORPERAZINE EDISYLATE 10 MG/2ML IJ SOLN: 5.0000 mg | Freq: Four times a day (QID) | INTRAMUSCULAR | Status: DC | PRN

## 2023-10-18 MED ORDER — ENOXAPARIN SODIUM 40 MG/0.4ML IJ SOSY
40.0000 mg | PREFILLED_SYRINGE | INTRAMUSCULAR | Status: DC
  Administered 2023-10-18: 40 mg via SUBCUTANEOUS
  Filled 2023-10-18: qty 0.4

## 2023-10-18 MED ORDER — GABAPENTIN 300 MG PO CAPS
300.0000 mg | ORAL_CAPSULE | Freq: Three times a day (TID) | ORAL | Status: DC
  Administered 2023-10-18: 300 mg via ORAL
  Filled 2023-10-18: qty 1

## 2023-10-18 MED ORDER — ORAL CARE MOUTH RINSE: 15.0000 mL | OROMUCOSAL | Status: DC | PRN

## 2023-10-18 MED ORDER — GABAPENTIN 100 MG PO CAPS
100.0000 mg | ORAL_CAPSULE | Freq: Every day | ORAL | Status: DC
  Administered 2023-10-19: 100 mg via ORAL
  Filled 2023-10-18: qty 1

## 2023-10-18 MED ORDER — ENOXAPARIN SODIUM 40 MG/0.4ML IJ SOSY: 40.0000 mg | PREFILLED_SYRINGE | Freq: Every day | INTRAMUSCULAR | Status: DC

## 2023-10-18 MED ORDER — ACETAMINOPHEN 325 MG PO TABS
650.0000 mg | ORAL_TABLET | Freq: Four times a day (QID) | ORAL | Status: DC | PRN
  Administered 2023-10-19: 650 mg via ORAL
  Filled 2023-10-18: qty 2

## 2023-10-18 NOTE — H&P (Signed)
 History and Physical    David Wyatt FMW:[id number] DOB: 1961-01-01 DOA: 10/17/2023  PCP: Delbert Clam, MD   Patient coming from: Home   Chief Complaint: Aches, malaise   HPI: David Wyatt is a 63 y.o. male with medical history significant for hypertension, hyperlipidemia, insulin -dependent diabetes mellitus, neuropathy, and homelessness who presents with aches and malaise that he attributes to being outside in the rain.  He denies chest pain, abdominal pain, fevers, or chills.  ED Course: Upon arrival to the ED, patient is found to be afebrile and saturating well on room air with normal HR and stable BP.  Labs are notable for glucose 1074, creatinine 1.59, normal WBC, and normal serum CK.  Patient was treated in the ED with 1 L of NS and 10 units subcutaneous NovoLog .  Review of Systems:  All other systems reviewed and apart from HPI, are negative.  Past Medical History:  Diagnosis Date   Angioedema of lips 07/28/2012   left upper (07/29/2012)   Arthritis    hands and back   Chronic back pain    Cocaine abuse (HCC)    Diabetic neuropathy (HCC)    High cholesterol    Hypertension    Neuropathy    Type II diabetes mellitus (HCC)     Past Surgical History:  Procedure Laterality Date   BACK SURGERY     CYSTOSCOPY W/ URETERAL STENT PLACEMENT Right 02/23/2021   Procedure: CYSTOSCOPY WITH RETROGRADE PYELOGRAM/URETERAL STENT PLACEMENT;  Surgeon: Selma Donnice SAUNDERS, MD;  Location: [facility];  Service: Urology;  Laterality: Right;   CYSTOSCOPY/URETEROSCOPY/HOLMIUM LASER/STENT PLACEMENT Right 12/05/2022   Procedure: CYSTOSCOPY, RIGHT URETEROSCOPY, AND RIGHT URETERAL STENT PLACEMENT;  Surgeon: Shane Steffan BROCKS, MD;  Location: [facility];  Service: Urology;  Laterality: Right;  90 MINUTES   HERNIA REPAIR Right 04/01/2012   I & D EXTREMITY Left 12/13/2012   Procedure: IRRIGATION AND DEBRIDEMENT LEFT THUMB;  Surgeon: Franky SAUNDERS Curia, MD;  Location: [facility] OR;  Service: Orthopedics;  Laterality: Left;    INCISION AND DRAINAGE OF WOUND     boil on back/notes 07/14/2008 (07/29/2012)   INGUINAL HERNIA REPAIR  04/01/2012   Procedure: HERNIA REPAIR INGUINAL ADULT;  Surgeon: Sherlean JINNY Laughter, MD;  Location: [facility] OR;  Service: General;  Laterality: Right;   INGUINAL HERNIA REPAIR Left 09/06/2016   Procedure: OPEN REPAIR LEFT INGUINAL HERNIA;  Surgeon: Tanda Locus, MD;  Location: [facility] OR;  Service: General;  Laterality: Left;   INSERTION OF MESH Left 09/06/2016   Procedure: INSERTION OF MESH;  Surgeon: Tanda Locus, MD;  Location: [facility] OR;  Service: General;  Laterality: Left;   TONSILLECTOMY      Social History:   reports that he has been smoking cigarettes. He has a 7.5 pack-year smoking history. He quit smokeless tobacco use about 8 years ago. He reports current drug use. Drug: Crack cocaine. He reports that he does not drink alcohol.  Allergies  Allergen Reactions   Lisinopril  Other (See Comments) and Swelling    Lip Swelling, angioedema  angioedema  Lip Swelling  Lip Swelling, angioedema    Family History  Problem Relation Age of Onset   Diabetes Mother    Kidney disease Mother    Hyperlipidemia Mother    Hyperlipidemia Father    Diabetes Father      Prior to Admission medications   Medication Sig Start Date End Date Taking? Authorizing Provider  Accu-Chek Softclix Lancets lancets Use to check blood sugar three times daily as directed 10/01/23  Lue Elsie BROCKS, MD  amLODipine  (NORVASC ) 10 MG tablet Take 1 tablet (10 mg total) by mouth daily. 10/01/23   Lue Elsie BROCKS, MD  atorvastatin  (LIPITOR) 40 MG tablet Take 1 tablet (40 mg total) by mouth daily. 10/01/23   Lue Elsie BROCKS, MD  Blood Glucose Monitoring Suppl (ACCU-CHEK GUIDE) w/Device KIT Use to test blood sugar 3 times daily 10/01/23   Lue Elsie BROCKS, MD  gabapentin  (NEURONTIN ) 300 MG capsule Take 1 capsule (300 mg total) by mouth 3 (three) times daily. 10/01/23   Lue Elsie BROCKS, MD  glucose blood  (ACCU-CHEK GUIDE TEST) test strip Use as instructed to check blood sugar three times daily 10/01/23   Lue Elsie BROCKS, MD  LANTUS  SOLOSTAR 100 UNIT/ML Solostar Pen Inject 30 Units into the skin at bedtime. 10/01/23   Lue Elsie BROCKS, MD  metFORMIN  (GLUCOPHAGE ) 500 MG tablet Take 1 tablet (500 mg total) by mouth 2 (two) times daily with a meal. 10/01/23   Lue Elsie BROCKS, MD    Physical Exam: Vitals:   10/17/23 1922 10/17/23 1937 10/18/23 0020  BP: (!) 161/87 (!) 147/85   Pulse: 69 82   Resp: 14 18   Temp: 98.2 F (36.8 C) 98.8 F (37.1 C) 98.7 F (37.1 C)  TempSrc: Oral Oral Oral  SpO2: 97% 100%     Constitutional: NAD, no pallor or diaphoresis   Eyes: PERTLA, lids and conjunctivae normal ENMT: Mucous membranes are dry. Posterior pharynx clear of any exudate or lesions.   Neck: supple, no masses  Respiratory: no wheezing, no crackles. No accessory muscle use.  Cardiovascular: S1 & S2 heard, regular rate and rhythm. No extremity edema.  Abdomen: No tenderness, soft. Bowel sounds active.  Musculoskeletal: no clubbing / cyanosis. No joint deformity upper and lower extremities.   Skin: no significant rashes, lesions, ulcers. Warm, dry, well-perfused. Neurologic: CN 2-12 grossly intact. Sleeping, wakes to voice and is oriented to person, place, and situation.  Psychiatric: Belligerent. Intermittently cooperative.    Labs and Imaging on Admission: I have personally reviewed following labs and imaging studies  CBC: Recent Labs  Lab 10/17/23 1946  WBC 6.7  NEUTROABS 4.3  HGB 13.9  HCT 42.9  MCV 96.2  PLT 339   Basic Metabolic Panel: Recent Labs  Lab 10/17/23 1946  NA 125*  K 4.7  CL 86*  CO2 25  GLUCOSE 1,074*  BUN 17  CREATININE 1.59*  CALCIUM  9.3   GFR: CrCl cannot be calculated (Unknown ideal weight.). Liver Function Tests: No results for input(s): AST, ALT, ALKPHOS, BILITOT, PROT, ALBUMIN in the last 168 hours. No results for input(s):  LIPASE, AMYLASE in the last 168 hours. No results for input(s): AMMONIA in the last 168 hours. Coagulation Profile: No results for input(s): INR, PROTIME in the last 168 hours. Cardiac Enzymes: Recent Labs  Lab 10/17/23 1946  CKTOTAL 140   BNP (last 3 results) No results for input(s): PROBNP in the last 8760 hours. HbA1C: No results for input(s): HGBA1C in the last 72 hours. CBG: Recent Labs  Lab 10/17/23 1954 10/17/23 2242 10/18/23 0124  GLUCAP >600* >600* 284*   Lipid Profile: No results for input(s): CHOL, HDL, LDLCALC, TRIG, CHOLHDL, LDLDIRECT in the last 72 hours. Thyroid  Function Tests: No results for input(s): TSH, T4TOTAL, FREET4, T3FREE, THYROIDAB in the last 72 hours. Anemia Panel: No results for input(s): VITAMINB12, FOLATE, FERRITIN, TIBC, IRON, RETICCTPCT in the last 72 hours. Urine analysis:    Component Value Date/Time   COLORURINE  STRAW (A) 09/27/2023 1149   APPEARANCEUR CLEAR 09/27/2023 1149   LABSPEC 1.026 09/27/2023 1149   PHURINE 5.0 09/27/2023 1149   GLUCOSEU >=500 (A) 09/27/2023 1149   HGBUR NEGATIVE 09/27/2023 1149   BILIRUBINUR NEGATIVE 09/27/2023 1149   BILIRUBINUR negative 03/26/2022 1531   BILIRUBINUR small 02/16/2019 1618   KETONESUR NEGATIVE 09/27/2023 1149   PROTEINUR NEGATIVE 09/27/2023 1149   UROBILINOGEN 0.2 03/26/2022 1531   UROBILINOGEN 1.0 10/12/2012 0149   NITRITE NEGATIVE 09/27/2023 1149   LEUKOCYTESUR NEGATIVE 09/27/2023 1149   Sepsis Labs: @LABRCNTIP (procalcitonin:4,lacticidven:4) )No results found for this or any previous visit (from the past 240 hours).   Radiological Exams on Admission: No results found.   Assessment/Plan   1. Uncontrolled DM with hyperglycemia  - Serum glucose is 1074 without acidosis in setting of medication non-compliance  - Check serum osmolality, continue IVF hydration, start insulin  infusion with frequent CBGs and serial chem panels    2. AKI   - Likely d/t marked hyperglycemia and osmotic diuresis  - Continue IVF and IV insulin , renally-dose medications, repeat chem panel   3. Hypertension  - Norvasc     4. HLD  - Lipitor     DVT prophylaxis: Lovenox   Code Status: Full  Level of Care: Level of care: Progressive Family Communication: none present  Disposition Plan:  Patient is from: home  Anticipated d/c is to: Home  Anticipated d/c date is: 8/9 or 10/19/23 Patient currently: pending glycemic-control, improved renal function  Consults called: None  Admission status: Observation     Evalene GORMAN Sprinkles, MD [facility]  10/18/2023, 1:42 AM
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
## 2023-10-18 NOTE — ED Triage Notes (Signed)
 Pt BIBA from side of road, c/o bilateral feet pain, right foot hurts more for past couple days.  Hx of diabetes and htn. Does not have access to meds. A&Ox4  BP 184/86 CBG 301

## 2023-10-18 NOTE — ED Notes (Signed)
 This RN attempted to draw labs & give medications to pt. Pt stated in an aggressive tone & loud volume I shouldn't have come here. This place is fucking stupid. You're a stupid bitch, can't you hear? You already got blood. My sugar is down to 280 I want something to eat. You're so fucking stupid. Pt disgruntled & would not allow me to draw his blood. This RN explained the importance of his ordered tests & medications. Pt continued to use derogatory language. Pt lying on stretcher locked in lowest position with call bell in reach cussing in complete sentences. Provider made aware.

## 2023-10-18 NOTE — ED Notes (Signed)
 Pt. Given diet soda per RN & MD

## 2023-10-18 NOTE — ED Notes (Signed)
 Pt still refusing to have labs or medications. Provider aware.

## 2023-10-18 NOTE — ED Notes (Signed)
 Pt stating he is going to leave to remove his IV. When attempting to remove the IV saline lock pt pulled his arm back stating to leave him alone & not touch him. Pt refusing all medications & labs at this time. Provider made aware.

## 2023-10-18 NOTE — H&P (Signed)
 History and Physical  David Wyatt FMW:[id number] DOB: 01-24-1961 DOA: 10/18/2023  Referring physician: Dr. Jakie, EDP  PCP: Delbert Clam, MD  Outpatient Specialists: None. Patient coming from: Homeless  Chief Complaint: Pain in the feet  HPI: David Wyatt is a 63 y.o. male with medical history significant for uncontrolled type 2 diabetes, homelessness, frequent ER visits, 20, in less than 8 months, admitted yesterday at [facility] for uncontrolled diabetes with hyperglycemia.  The patient left AMA without completing his treatment because of frequent finger sticks for blood glucose checks.  He presents today with complaints of burning pain in his feet.  In the ER, serum glucose 971 with non anion gap metabolic acidosis.  His mentation is intact.  The patient was started on IV fluid and IV insulin .  [facility], hospitalist service, was asked to admit for uncontrolled type 2 diabetes with hyperglycemia.  ED Course: Temperature 98.  BP 155/91, pulse 79, respiratory rate 20, O2 saturation 94% on room air.  Lab studies notable for serum glucose 971, creatinine 1.83 from baseline of 1.1.  Review of Systems: Review of systems as noted in the HPI. All other systems reviewed and are negative.   Past Medical History:  Diagnosis Date   Angioedema of lips 07/28/2012   left upper (07/29/2012)   Arthritis    hands and back   Chronic back pain    Cocaine abuse (HCC)    Diabetic neuropathy (HCC)    High cholesterol    Hypertension    Neuropathy    Type II diabetes mellitus (HCC)    Past Surgical History:  Procedure Laterality Date   BACK SURGERY     CYSTOSCOPY W/ URETERAL STENT PLACEMENT Right 02/23/2021   Procedure: CYSTOSCOPY WITH RETROGRADE PYELOGRAM/URETERAL STENT PLACEMENT;  Surgeon: Selma Donnice SAUNDERS, MD;  Location: [facility] ORS;  Service: Urology;  Laterality: Right;   CYSTOSCOPY/URETEROSCOPY/HOLMIUM LASER/STENT PLACEMENT Right 12/05/2022   Procedure: CYSTOSCOPY, RIGHT URETEROSCOPY, AND  RIGHT URETERAL STENT PLACEMENT;  Surgeon: Shane Steffan BROCKS, MD;  Location: [facility];  Service: Urology;  Laterality: Right;  90 MINUTES   HERNIA REPAIR Right 04/01/2012   I & D EXTREMITY Left 12/13/2012   Procedure: IRRIGATION AND DEBRIDEMENT LEFT THUMB;  Surgeon: Franky SAUNDERS Curia, MD;  Location: [facility] OR;  Service: Orthopedics;  Laterality: Left;   INCISION AND DRAINAGE OF WOUND     boil on back/notes 07/14/2008 (07/29/2012)   INGUINAL HERNIA REPAIR  04/01/2012   Procedure: HERNIA REPAIR INGUINAL ADULT;  Surgeon: Sherlean JINNY Laughter, MD;  Location: [facility] OR;  Service: General;  Laterality: Right;   INGUINAL HERNIA REPAIR Left 09/06/2016   Procedure: OPEN REPAIR LEFT INGUINAL HERNIA;  Surgeon: Tanda Locus, MD;  Location: [facility] OR;  Service: General;  Laterality: Left;   INSERTION OF MESH Left 09/06/2016   Procedure: INSERTION OF MESH;  Surgeon: Tanda Locus, MD;  Location: [facility] OR;  Service: General;  Laterality: Left;   TONSILLECTOMY      Social History:  reports that he has been smoking cigarettes. He has a 7.5 pack-year smoking history. He quit smokeless tobacco use about 8 years ago. He reports current drug use. Drug: Crack cocaine. He reports that he does not drink alcohol.   Allergies  Allergen Reactions   Lisinopril  Other (See Comments) and Swelling    Lip Swelling, angioedema  angioedema  Lip Swelling  Lip Swelling, angioedema    Family History  Problem Relation Age of Onset   Diabetes Mother    Kidney disease Mother  Hyperlipidemia Mother    Hyperlipidemia Father    Diabetes Father       Prior to Admission medications   Medication Sig Start Date End Date Taking? Authorizing Provider  Accu-Chek Softclix Lancets lancets Use to check blood sugar three times daily as directed 10/01/23   Lue Elsie BROCKS, MD  amLODipine  (NORVASC ) 10 MG tablet Take 1 tablet (10 mg total) by mouth daily. 10/01/23   Lue Elsie BROCKS, MD  atorvastatin  (LIPITOR) 40 MG tablet Take 1 tablet (40 mg total)  by mouth daily. 10/01/23   Lue Elsie BROCKS, MD  Blood Glucose Monitoring Suppl (ACCU-CHEK GUIDE) w/Device KIT Use to test blood sugar 3 times daily 10/01/23   Lue Elsie BROCKS, MD  gabapentin  (NEURONTIN ) 300 MG capsule Take 1 capsule (300 mg total) by mouth 3 (three) times daily. 10/01/23   Lue Elsie BROCKS, MD  glucose blood (ACCU-CHEK GUIDE TEST) test strip Use as instructed to check blood sugar three times daily 10/01/23   Lue Elsie BROCKS, MD  LANTUS  SOLOSTAR 100 UNIT/ML Solostar Pen Inject 30 Units into the skin at bedtime. 10/01/23   Lue Elsie BROCKS, MD  metFORMIN  (GLUCOPHAGE ) 500 MG tablet Take 1 tablet (500 mg total) by mouth 2 (two) times daily with a meal. 10/01/23   Lue Elsie BROCKS, MD  NOVOLOG  PENFILL cartridge Inject 10 Units into the skin 3 (three) times daily with meals. 07/29/23   [provider]    Physical Exam: BP 137/76   Pulse 82   Temp 98 F (36.7 C) (Oral)   Resp 20   SpO2 94%   General: 63 y.o. year-old male well developed well nourished in no acute distress.  Alert and oriented x3. Cardiovascular: Regular rate and rhythm with no rubs or gallops.  No thyromegaly or JVD noted.  No lower extremity edema. 2/4 pulses in all 4 extremities. Respiratory: Clear to auscultation with no wheezes or rales. Good inspiratory effort. Abdomen: Soft nontender nondistended with normal bowel sounds x4 quadrants. Muskuloskeletal: No cyanosis, clubbing or edema noted bilaterally Neuro: CN II-XII intact, strength, sensation, reflexes Skin: No ulcerative lesions noted or rashes Psychiatry: Judgement and insight appear normal. Mood is irritable.         Labs on Admission:  Basic Metabolic Panel: Recent Labs  Lab 10/17/23 1946 10/18/23 2033  NA 125* 128*  K 4.7 4.6  CL 86* 91*  CO2 25 23  GLUCOSE 1,074* 971*  BUN 17 15  CREATININE 1.59* 1.83*  CALCIUM  9.3 8.7*   Liver Function Tests: No results for input(s): AST, ALT, ALKPHOS, BILITOT,  PROT, ALBUMIN in the last 168 hours. No results for input(s): LIPASE, AMYLASE in the last 168 hours. No results for input(s): AMMONIA in the last 168 hours. CBC: Recent Labs  Lab 10/17/23 1946 10/18/23 2033  WBC 6.7 5.6  NEUTROABS 4.3 3.5  HGB 13.9 12.3*  HCT 42.9 38.1*  MCV 96.2 96.7  PLT 339 310   Cardiac Enzymes: Recent Labs  Lab 10/17/23 1946  CKTOTAL 140    BNP (last 3 results) No results for input(s): BNP in the last 8760 hours.  ProBNP (last 3 results) No results for input(s): PROBNP in the last 8760 hours.  CBG: Recent Labs  Lab 10/17/23 2242 10/18/23 0124 10/18/23 0725 10/18/23 2057 10/18/23 2059  GLUCAP >600* 284* 447* >600* >600*    Radiological Exams on Admission: DG Foot Complete Right Result Date: 10/18/2023 CLINICAL DATA:  Right foot pain EXAM: RIGHT FOOT COMPLETE - 3+ VIEW COMPARISON:  None available FINDINGS: Normal alignment. No acute fracture or dislocation. 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 Remaining joint spaces are preserved. Soft tissues are unremarkable. IMPRESSION: 1. 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 Electronically Signed   By: Dorethia Molt M.D.   On: 10/18/2023 21:38    EKG: I independently viewed the EKG done and my findings are as followed: None available at the time of this visit.  Sinus rhythm rate of 82.  QTc 472.  Assessment/Plan Present on Admission:  Hyperglycemia  Active Problems:   Hyperglycemia  Uncontrolled Type 2 diabetes with severe hyperglycemia without HHS History of medication noncompliance Last hemoglobin A1c 13.9 on 09/27/2023 Continue IV fluid and IV insulin  Replace electrolytes as indicated Diabetes coordinator consulted for patient's education  AKI, prerenal in the setting of dehydration from hyperglycemia Baseline creatinine 1.1 with GFR greater than 60 Presented with creatinine of 1.83 with GFR 41. Continue IV fluid hydration Monitor urine  output Repeat BMP in the morning  Pseudohyponatremia Corrected sodium for hyperglycemia 142 Continue IV fluids and continue to monitor electrolytes Okay for water and ice chips  Burning pain in the feet, symmetrically, suspect diabetic polyneuropathy Gabapentin  100 mg nightly Fall precautions  Homelessness TOC consulted to assist with discharge planning  Medication noncompliance History of leaving AGAINST MEDICAL ADVICE   Time: 75 minutes.   DVT prophylaxis: Subcu Lovenox  daily  Code Status: Full code.  Family Communication: None at bedside.  Disposition Plan: Admitted to stepdown unit.  Consults called: Diabetes coordinator.  Admission status: Inpatient status.   Status is: Inpatient The patient requires at least 2 midnights for further evaluation and treatment of present condition.   Terry LOISE Hurst MD [facility] Pager [phone]  If 7PM-7AM, please contact night-coverage [email or web address] Password [email or web address]  10/18/2023, 11:03 PM
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
## 2023-10-18 NOTE — ED Notes (Signed)
 Pt threatening to leave, refused blood draw.[email or web address]
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
## 2023-10-19 ENCOUNTER — Encounter (HOSPITAL_COMMUNITY): Payer: Self-pay | Admitting: *Deleted

## 2023-10-19 ENCOUNTER — Other Ambulatory Visit: Payer: Self-pay

## 2023-10-19 ENCOUNTER — Emergency Department (HOSPITAL_COMMUNITY)
Admission: EM | Admit: 2023-10-19 | Discharge: 2023-10-19 | Disposition: A | Discharge status: Home / Self Care | Payer: MEDICAID | Attending: Emergency Medicine | Admitting: Emergency Medicine

## 2023-10-19 DIAGNOSIS — E1165 Type 2 diabetes mellitus with hyperglycemia: Secondary | ICD-10-CM | POA: Diagnosis not present

## 2023-10-19 DIAGNOSIS — Z794 Long term (current) use of insulin: Secondary | ICD-10-CM | POA: Diagnosis not present

## 2023-10-19 DIAGNOSIS — Z7984 Long term (current) use of oral hypoglycemic drugs: Secondary | ICD-10-CM | POA: Diagnosis not present

## 2023-10-19 DIAGNOSIS — R739 Hyperglycemia, unspecified: Secondary | ICD-10-CM | POA: Diagnosis present

## 2023-10-19 LAB — I-STAT VENOUS BLOOD GAS, ED
Acid-base deficit: 4 mmol/L — ABNORMAL HIGH (ref 0.0–2.0)
Bicarbonate: 19.8 mmol/L — ABNORMAL LOW (ref 20.0–28.0)
Calcium, Ion: 1.06 mmol/L — ABNORMAL LOW (ref 1.15–1.40)
HCT: 38 % — ABNORMAL LOW (ref 39.0–52.0)
Hemoglobin: 12.9 g/dL — ABNORMAL LOW (ref 13.0–17.0)
O2 Saturation: 99 %
Potassium: 4.4 mmol/L (ref 3.5–5.1)
Sodium: 127 mmol/L — ABNORMAL LOW (ref 135–145)
TCO2: 21 mmol/L — ABNORMAL LOW (ref 22–32)
pCO2, Ven: 30.7 mmHg — ABNORMAL LOW (ref 44–60)
pH, Ven: 7.418 (ref 7.25–7.43)
pO2, Ven: 151 mmHg — ABNORMAL HIGH (ref 32–45)

## 2023-10-19 LAB — CBC
HCT: 38.5 % — ABNORMAL LOW (ref 39.0–52.0)
Hemoglobin: 12.3 g/dL — ABNORMAL LOW (ref 13.0–17.0)
MCH: 31.2 pg (ref 26.0–34.0)
MCHC: 31.9 g/dL (ref 30.0–36.0)
MCV: 97.7 fL (ref 80.0–100.0)
Platelets: 284 K/uL (ref 150–400)
RBC: 3.94 MIL/uL — ABNORMAL LOW (ref 4.22–5.81)
RDW: 13 % (ref 11.5–15.5)
WBC: 5.8 K/uL (ref 4.0–10.5)
nRBC: 0 % (ref 0.0–0.2)

## 2023-10-19 LAB — GLUCOSE, CAPILLARY
Glucose-Capillary: 266 mg/dL — ABNORMAL HIGH (ref 70–99)
Glucose-Capillary: 269 mg/dL — ABNORMAL HIGH (ref 70–99)
Glucose-Capillary: 441 mg/dL — ABNORMAL HIGH (ref 70–99)
Glucose-Capillary: 527 mg/dL (ref 70–99)
Glucose-Capillary: 80 mg/dL (ref 70–99)
Glucose-Capillary: 88 mg/dL (ref 70–99)
Glucose-Capillary: 92 mg/dL (ref 70–99)

## 2023-10-19 LAB — I-STAT CHEM 8, ED
BUN: 24 mg/dL — ABNORMAL HIGH (ref 8–23)
Calcium, Ion: 1.05 mmol/L — ABNORMAL LOW (ref 1.15–1.40)
Chloride: 97 mmol/L — ABNORMAL LOW (ref 98–111)
Creatinine, Ser: 1.3 mg/dL — ABNORMAL HIGH (ref 0.61–1.24)
Glucose, Bld: >700 mg/dL (ref 70–99)
HCT: 39 % (ref 39.0–52.0)
Hemoglobin: 13.3 g/dL (ref 13.0–17.0)
Potassium: 4.4 mmol/L (ref 3.5–5.1)
Sodium: 127 mmol/L — ABNORMAL LOW (ref 135–145)
TCO2: 21 mmol/L — ABNORMAL LOW (ref 22–32)

## 2023-10-19 LAB — CBG MONITORING, ED
Glucose-Capillary: >600 mg/dL (ref 70–99)
Glucose-Capillary: 384 mg/dL — ABNORMAL HIGH (ref 70–99)
Glucose-Capillary: 330 mg/dL — ABNORMAL HIGH (ref 70–99)

## 2023-10-19 LAB — COMPREHENSIVE METABOLIC PANEL WITH GFR
ALT: 14 U/L (ref 0–44)
AST: 22 U/L (ref 15–41)
Albumin: 2.9 g/dL — ABNORMAL LOW (ref 3.5–5.0)
Alkaline Phosphatase: 109 U/L (ref 38–126)
Anion gap: 12 (ref 5–15)
BUN: 21 mg/dL (ref 8–23)
CO2: 18 mmol/L — ABNORMAL LOW (ref 22–32)
Calcium: 8.3 mg/dL — ABNORMAL LOW (ref 8.9–10.3)
Chloride: 96 mmol/L — ABNORMAL LOW (ref 98–111)
Creatinine, Ser: 1.48 mg/dL — ABNORMAL HIGH (ref 0.61–1.24)
GFR, Estimated: 53 mL/min — ABNORMAL LOW (ref >60)
Glucose, Bld: 962 mg/dL (ref 70–99)
Potassium: 4.5 mmol/L (ref 3.5–5.1)
Sodium: 126 mmol/L — ABNORMAL LOW (ref 135–145)
Total Bilirubin: 0.7 mg/dL (ref 0.0–1.2)
Total Protein: 6.2 g/dL — ABNORMAL LOW (ref 6.5–8.1)

## 2023-10-19 LAB — BASIC METABOLIC PANEL WITH GFR
Anion gap: 11 (ref 5–15)
BUN: 17 mg/dL (ref 8–23)
CO2: 25 mmol/L (ref 22–32)
Calcium: 8.8 mg/dL — ABNORMAL LOW (ref 8.9–10.3)
Chloride: 98 mmol/L (ref 98–111)
Creatinine, Ser: 1.43 mg/dL — ABNORMAL HIGH (ref 0.61–1.24)
GFR, Estimated: 55 mL/min — ABNORMAL LOW (ref >60)
Glucose, Bld: 566 mg/dL (ref 70–99)
Potassium: 3.7 mmol/L (ref 3.5–5.1)
Sodium: 134 mmol/L — ABNORMAL LOW (ref 135–145)

## 2023-10-19 LAB — URINALYSIS, ROUTINE W REFLEX MICROSCOPIC
Bacteria, UA: NONE SEEN
Bilirubin Urine: NEGATIVE
Glucose, UA: >=500 mg/dL — AB
Hgb urine dipstick: NEGATIVE
Ketones, ur: NEGATIVE mg/dL
Leukocytes,Ua: NEGATIVE
Nitrite: NEGATIVE
Protein, ur: NEGATIVE mg/dL
Specific Gravity, Urine: 1.018 (ref 1.005–1.030)
pH: 7 (ref 5.0–8.0)

## 2023-10-19 LAB — BETA-HYDROXYBUTYRIC ACID
Beta-Hydroxybutyric Acid: 0.2 mmol/L (ref 0.05–0.27)
Beta-Hydroxybutyric Acid: 0.18 mmol/L (ref 0.05–0.27)

## 2023-10-19 LAB — MRSA NEXT GEN BY PCR, NASAL: MRSA by PCR Next Gen: NOT DETECTED

## 2023-10-19 MED ORDER — MUSCLE RUB 10-15 % EX CREA: TOPICAL_CREAM | CUTANEOUS | Status: DC | PRN

## 2023-10-19 MED ORDER — KETOROLAC TROMETHAMINE 15 MG/ML IJ SOLN
15.0000 mg | Freq: Once | INTRAMUSCULAR | Status: AC
  Administered 2023-10-19: 15 mg via INTRAVENOUS
  Filled 2023-10-19: qty 1

## 2023-10-19 MED ORDER — LACTATED RINGERS IV BOLUS
2000.0000 mL | Freq: Once | INTRAVENOUS | Status: AC
  Administered 2023-10-19: 2000 mL via INTRAVENOUS

## 2023-10-19 MED ORDER — INSULIN ASPART 100 UNIT/ML IJ SOLN
15.0000 [IU] | Freq: Once | INTRAMUSCULAR | Status: AC
  Administered 2023-10-19: 15 [IU] via SUBCUTANEOUS

## 2023-10-19 MED ORDER — ACETAMINOPHEN 325 MG PO TABS
650.0000 mg | ORAL_TABLET | Freq: Once | ORAL | Status: AC
  Administered 2023-10-19: 650 mg via ORAL
  Filled 2023-10-19: qty 2

## 2023-10-19 MED ORDER — CHLORHEXIDINE GLUCONATE CLOTH 2 % EX PADS: 6.0000 | MEDICATED_PAD | Freq: Every day | CUTANEOUS | Status: DC

## 2023-10-19 MED ORDER — METHOCARBAMOL 500 MG PO TABS
500.0000 mg | ORAL_TABLET | Freq: Four times a day (QID) | ORAL | Status: DC | PRN
  Administered 2023-10-19: 500 mg via ORAL
  Filled 2023-10-19: qty 1

## 2023-10-19 MED ORDER — INSULIN GLARGINE-YFGN 100 UNIT/ML ~~LOC~~ SOLN
20.0000 [IU] | SUBCUTANEOUS | Status: AC
  Administered 2023-10-19: 20 [IU] via SUBCUTANEOUS
  Filled 2023-10-19: qty 0.2

## 2023-10-19 NOTE — Progress Notes (Signed)
 Patient became verbally aggressive towards caregivers, refusing all care and asking to go AMA despite education of ongoing care plan. NP came to bedside to assess pt, but pt continued to be very disrespectful and aggressive, refusing to sign the AMA form. Security was called to the bedside. NP is aware of the situation.     Jaye Harrier, RN

## 2023-10-19 NOTE — Discharge Instructions (Addendum)
 Please call the number attached for [facility] in order to establish care and get your long-acting and short acting insulin .

## 2023-10-19 NOTE — ED Triage Notes (Addendum)
 [place] [facility] [place]  Called 911 and ask them if they could check his blood sugar.  He had been urinating a lot and was very thirsty, so he figured his sugar was high.  Pt states he ran out of long lasting insulin  several months ago and has just been using his short acting, however he lost that 2 days ago.    Cbg read high Rr 30 Bp 210/90 Hr 88 Spo2 95%  Pt ao x 4.

## 2023-10-19 NOTE — Progress Notes (Signed)
    Against Medical Advice   David Wyatt , A/O x4, expresses desire to leave the Hospital immediately.  I went to bedside to discuss his decision to leave hospital care.  He expresses frustration at not being able to eat. CBG's have improved and insulin  is off.  I mention that I would be adding a diet order. He is demanding aggressively to be fed and becomes enraged when told that food options are limited given that cafeteria is not open at this hour.   I attempted to de-escalate the situation, however pt is demanding to leave AMA. Patient has been warned that this is not medically advisable at this time, and can result in medical complications like death and disability. He understands, but is refusing to sign AMA form.    Britney Newstrom, DNP, ACNPC- AG [facility] [place]

## 2023-10-19 NOTE — ED Provider Notes (Signed)
 [place] EMERGENCY DEPARTMENT AT [facility] Provider Note   CSN: [id number] Arrival date & time: 10/19/23  1831     Patient presents with: Hyperglycemia   David Wyatt is a 63 y.o. male past medical history significant for polysubstance use, type 2 diabetes who presents emergency department for hyperglycemia.  Patient states that he has not had access to his long or short acting insulin  therefore he believes his glucose levels were elevated causing him to call EMS.  Patient states that he believes his glucose levels were elevated because he has had symptoms of polyuria and polydipsia.  Patient denies associated nausea, vomiting, abdominal pain, shortness of breath, chest pain, syncope.    Hyperglycemia      Prior to Admission medications   Medication Sig Start Date End Date Taking? Authorizing Provider  Accu-Chek Softclix Lancets lancets Use to check blood sugar three times daily as directed 10/01/23   Lue Elsie BROCKS, MD  amLODipine  (NORVASC ) 10 MG tablet Take 1 tablet (10 mg total) by mouth daily. 10/01/23   Lue Elsie BROCKS, MD  atorvastatin  (LIPITOR) 40 MG tablet Take 1 tablet (40 mg total) by mouth daily. 10/01/23   Lue Elsie BROCKS, MD  Blood Glucose Monitoring Suppl (ACCU-CHEK GUIDE) w/Device KIT Use to test blood sugar 3 times daily 10/01/23   Lue Elsie BROCKS, MD  gabapentin  (NEURONTIN ) 300 MG capsule Take 1 capsule (300 mg total) by mouth 3 (three) times daily. 10/01/23   Lue Elsie BROCKS, MD  glucose blood (ACCU-CHEK GUIDE TEST) test strip Use as instructed to check blood sugar three times daily 10/01/23   Lue Elsie BROCKS, MD  LANTUS  SOLOSTAR 100 UNIT/ML Solostar Pen Inject 30 Units into the skin at bedtime. 10/01/23   Lue Elsie BROCKS, MD  metFORMIN  (GLUCOPHAGE ) 500 MG tablet Take 1 tablet (500 mg total) by mouth 2 (two) times daily with a meal. 10/01/23   Lue Elsie BROCKS, MD  NOVOLOG  PENFILL cartridge Inject 10 Units into the skin 3  (three) times daily with meals. 07/29/23   [provider]    Allergies: Lisinopril     Review of Systems  Updated Vital Signs BP (!) 150/98 (BP Location: Right Arm)   Pulse 88   Temp 98.4 F (36.9 C) (Oral)   Resp 19   Ht 5' 8 (1.727 m)   Wt 67.7 kg   SpO2 99%   BMI 22.69 kg/m   Physical Exam Vitals reviewed.  Constitutional:      General: He is not in acute distress.    Appearance: He is not ill-appearing.  HENT:     Head: Normocephalic and atraumatic.  Cardiovascular:     Rate and Rhythm: Normal rate and regular rhythm.     Pulses:          Radial pulses are 2+ on the right side and 2+ on the left side.     Heart sounds: Normal heart sounds. No murmur heard. Pulmonary:     Effort: Pulmonary effort is normal. No tachypnea or respiratory distress.  Abdominal:     General: There is no distension.     Palpations: Abdomen is soft.     Tenderness: There is no abdominal tenderness.  Musculoskeletal:     Right lower leg: No edema.     Left lower leg: No edema.     Comments: Spontaneous movement of bilateral upper and lower extremities  Neurological:     Mental Status: He is alert and oriented to person, place, and time.  Psychiatric:        Behavior: Behavior is cooperative.     (all labs ordered are listed, but only abnormal results are displayed) Labs Reviewed  CBC - Abnormal; Notable for the following components:      Result Value   RBC 3.94 (*)    Hemoglobin 12.3 (*)    HCT 38.5 (*)    All other components within normal limits  URINALYSIS, ROUTINE W REFLEX MICROSCOPIC - Abnormal; Notable for the following components:   Color, Urine COLORLESS (*)    Glucose, UA >=500 (*)    All other components within normal limits  COMPREHENSIVE METABOLIC PANEL WITH GFR - Abnormal; Notable for the following components:   Sodium 126 (*)    Chloride 96 (*)    CO2 18 (*)    Glucose, Bld 962 (*)    Creatinine, Ser 1.48 (*)    Calcium  8.3 (*)    Total Protein 6.2  (*)    Albumin 2.9 (*)    GFR, Estimated 53 (*)    All other components within normal limits  CBG MONITORING, ED - Abnormal; Notable for the following components:   Glucose-Capillary >600 (*)    All other components within normal limits  CBG MONITORING, ED - Abnormal; Notable for the following components:   Glucose-Capillary 384 (*)    All other components within normal limits  I-STAT VENOUS BLOOD GAS, ED - Abnormal; Notable for the following components:   pCO2, Ven 30.7 (*)    pO2, Ven 151 (*)    Bicarbonate 19.8 (*)    TCO2 21 (*)    Acid-base deficit 4.0 (*)    Sodium 127 (*)    Calcium , Ion 1.06 (*)    HCT 38.0 (*)    Hemoglobin 12.9 (*)    All other components within normal limits  I-STAT CHEM 8, ED - Abnormal; Notable for the following components:   Sodium 127 (*)    Chloride 97 (*)    BUN 24 (*)    Creatinine, Ser 1.30 (*)    Glucose, Bld >700 (*)    Calcium , Ion 1.05 (*)    TCO2 21 (*)    All other components within normal limits  CBG MONITORING, ED - Abnormal; Notable for the following components:   Glucose-Capillary 330 (*)    All other components within normal limits  BETA-HYDROXYBUTYRIC ACID  CBG MONITORING, ED    EKG: EKG Interpretation Date/Time:  Sunday October 19 2023 18:35:56 EDT Ventricular Rate:  84 PR Interval:  158 QRS Duration:  74 QT Interval:  380 QTC Calculation: 449 R Axis:   -17  Text Interpretation: Normal sinus rhythm Minimal voltage criteria for LVH, may be normal variant ( R in aVL ) Borderline ECG When compared with ECG of 18-Oct-2023 20:46, PREVIOUS ECG IS PRESENT Confirmed by Yolande Charleston [phone]) on 10/19/2023 7:10:23 PM  Radiology: ARCOLA Foot Complete Right Result Date: 10/18/2023 CLINICAL DATA:  Right foot pain EXAM: RIGHT FOOT COMPLETE - 3+ VIEW COMPARISON:  None available FINDINGS: Normal alignment. No acute fracture or dislocation. 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 Remaining joint spaces are preserved. Soft  tissues are unremarkable. IMPRESSION: 1. 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 Electronically Signed   By: Dorethia Molt M.D.   On: 10/18/2023 21:38     Procedures   Medications Ordered in the ED  lactated ringers  bolus 2,000 mL (0 mLs Intravenous Stopped 10/19/23 2145)  insulin  glargine-yfgn (SEMGLEE ) injection 20 Units (20  Units Subcutaneous Given 10/19/23 2213)  insulin  aspart (novoLOG ) injection 15 Units (15 Units Subcutaneous Given 10/19/23 1934)  acetaminophen  (TYLENOL ) tablet 650 mg (650 mg Oral Given 10/19/23 2129)                                    Medical Decision Making Amount and/or Complexity of Data Reviewed Labs: ordered.  Risk OTC drugs. Prescription drug management.   On initial valuation patient is hemodynamically stable, afebrile, saturating well on room air, not tachypneic and not in acute distress.  Point-of-care glucose obtained greater than 700.  Differential diagnoses include hyperglycemia, HHS, DKA, sepsis/infection, electrolyte abnormality.  Will obtain laboratory studies and begin fluid resuscitation with 2 L normal saline.  Laboratory studies without evidence of metabolic acidosis, beta hydroxybutyrate within normal limits and urinalysis without ketonuria therefore patient's presentation less likely DKA.  As patient has no evidence of significant electrolyte abnormalities and is alert and oriented and not in respiratory distress do not believe patient's presentation is secondary to HHS.  There is no evidence of underlying infection therefore believe patient's presentation is secondary to hyperglycemia.  Point-of-care glucose monitored throughout duration of stay.  Patient given short acting insulin  15 units with improvement in blood glucose.  On repeat evaluation patient remains hemodynamically stable and not in acute distress.  Point-of-care glucose 330 therefore will give long-acting insulin .  At this time believe patient is safe to be discharged  as he remains hemodynamically stable, afebrile, not in acute distress with improving glucose levels.  Patient given referral to community health in order to establish care and have management of long and short acting insulin .  At the time of discharge patient agreed with and understood plan of care.     Final diagnoses:  Hyperglycemia    ED Discharge Orders     None      Lavanda Bolster DO Emergency Medicine PGY2     Bolster Lavanda, DO 10/19/23 2254    Yolande Lamar BROCKS, MD 10/23/23 1755

## 2023-10-20 ENCOUNTER — Encounter (HOSPITAL_COMMUNITY): Payer: Self-pay

## 2023-10-20 ENCOUNTER — Encounter: Payer: Self-pay | Admitting: Physician Assistant

## 2023-10-20 ENCOUNTER — Emergency Department (HOSPITAL_COMMUNITY)
Admission: EM | Admit: 2023-10-20 | Discharge: 2023-10-20 | Disposition: A | Discharge status: Home / Self Care | Payer: MEDICAID | Attending: Emergency Medicine | Admitting: Emergency Medicine

## 2023-10-20 ENCOUNTER — Ambulatory Visit: Payer: MEDICAID | Admitting: Physician Assistant

## 2023-10-20 ENCOUNTER — Other Ambulatory Visit: Payer: Self-pay

## 2023-10-20 ENCOUNTER — Telehealth: Payer: Self-pay

## 2023-10-20 VITALS — BP 167/93 | HR 79

## 2023-10-20 DIAGNOSIS — E1165 Type 2 diabetes mellitus with hyperglycemia: Secondary | ICD-10-CM | POA: Diagnosis not present

## 2023-10-20 DIAGNOSIS — I10 Essential (primary) hypertension: Secondary | ICD-10-CM

## 2023-10-20 DIAGNOSIS — Z7984 Long term (current) use of oral hypoglycemic drugs: Secondary | ICD-10-CM | POA: Diagnosis not present

## 2023-10-20 DIAGNOSIS — R739 Hyperglycemia, unspecified: Secondary | ICD-10-CM | POA: Insufficient documentation

## 2023-10-20 DIAGNOSIS — Z794 Long term (current) use of insulin: Secondary | ICD-10-CM | POA: Diagnosis not present

## 2023-10-20 LAB — I-STAT VENOUS BLOOD GAS, ED
Acid-base deficit: 2 mmol/L (ref 0.0–2.0)
Bicarbonate: 21.5 mmol/L (ref 20.0–28.0)
Calcium, Ion: 1.12 mmol/L — ABNORMAL LOW (ref 1.15–1.40)
HCT: 40 % (ref 39.0–52.0)
Hemoglobin: 13.6 g/dL (ref 13.0–17.0)
O2 Saturation: 99 %
Potassium: 4.4 mmol/L (ref 3.5–5.1)
Sodium: 131 mmol/L — ABNORMAL LOW (ref 135–145)
TCO2: 22 mmol/L (ref 22–32)
pCO2, Ven: 32.7 mmHg — ABNORMAL LOW (ref 44–60)
pH, Ven: 7.425 (ref 7.25–7.43)
pO2, Ven: 121 mmHg — ABNORMAL HIGH (ref 32–45)

## 2023-10-20 LAB — CBC WITH DIFFERENTIAL/PLATELET
Abs Immature Granulocytes: 0.02 K/uL (ref 0.00–0.07)
Basophils Absolute: 0 K/uL (ref 0.0–0.1)
Basophils Relative: 1 %
Eosinophils Absolute: 0.1 K/uL (ref 0.0–0.5)
Eosinophils Relative: 3 %
HCT: 39.4 % (ref 39.0–52.0)
Hemoglobin: 13 g/dL (ref 13.0–17.0)
Immature Granulocytes: 0 %
Lymphocytes Relative: 22 %
Lymphs Abs: 1.2 K/uL (ref 0.7–4.0)
MCH: 31.3 pg (ref 26.0–34.0)
MCHC: 33 g/dL (ref 30.0–36.0)
MCV: 94.7 fL (ref 80.0–100.0)
Monocytes Absolute: 0.3 K/uL (ref 0.1–1.0)
Monocytes Relative: 6 %
Neutro Abs: 3.6 K/uL (ref 1.7–7.7)
Neutrophils Relative %: 68 %
Platelets: 287 K/uL (ref 150–400)
RBC: 4.16 MIL/uL — ABNORMAL LOW (ref 4.22–5.81)
RDW: 12.8 % (ref 11.5–15.5)
WBC: 5.3 K/uL (ref 4.0–10.5)
nRBC: 0 % (ref 0.0–0.2)

## 2023-10-20 LAB — COMPREHENSIVE METABOLIC PANEL WITH GFR
ALT: 16 U/L (ref 0–44)
AST: 22 U/L (ref 15–41)
Albumin: 3 g/dL — ABNORMAL LOW (ref 3.5–5.0)
Alkaline Phosphatase: 97 U/L (ref 38–126)
Anion gap: 10 (ref 5–15)
BUN: 24 mg/dL — ABNORMAL HIGH (ref 8–23)
CO2: 21 mmol/L — ABNORMAL LOW (ref 22–32)
Calcium: 8.8 mg/dL — ABNORMAL LOW (ref 8.9–10.3)
Chloride: 99 mmol/L (ref 98–111)
Creatinine, Ser: 1.47 mg/dL — ABNORMAL HIGH (ref 0.61–1.24)
GFR, Estimated: 53 mL/min — ABNORMAL LOW (ref >60)
Glucose, Bld: 628 mg/dL (ref 70–99)
Potassium: 4.4 mmol/L (ref 3.5–5.1)
Sodium: 130 mmol/L — ABNORMAL LOW (ref 135–145)
Total Bilirubin: 0.7 mg/dL (ref 0.0–1.2)
Total Protein: 6.5 g/dL (ref 6.5–8.1)

## 2023-10-20 LAB — URINALYSIS, ROUTINE W REFLEX MICROSCOPIC
Bacteria, UA: NONE SEEN
Bilirubin Urine: NEGATIVE
Glucose, UA: >=500 mg/dL — AB
Hgb urine dipstick: NEGATIVE
Ketones, ur: NEGATIVE mg/dL
Leukocytes,Ua: NEGATIVE
Nitrite: NEGATIVE
Protein, ur: NEGATIVE mg/dL
Specific Gravity, Urine: 1.018 (ref 1.005–1.030)
pH: 7 (ref 5.0–8.0)

## 2023-10-20 LAB — GLUCOSE, POCT (MANUAL RESULT ENTRY): POC Glucose: HIGH mg/dL (ref 70–99)

## 2023-10-20 LAB — BETA-HYDROXYBUTYRIC ACID: Beta-Hydroxybutyric Acid: 0.2 mmol/L (ref 0.05–0.27)

## 2023-10-20 LAB — CBG MONITORING, ED
Glucose-Capillary: >600 mg/dL (ref 70–99)
Glucose-Capillary: 474 mg/dL — ABNORMAL HIGH (ref 70–99)
Glucose-Capillary: 425 mg/dL — ABNORMAL HIGH (ref 70–99)

## 2023-10-20 MED ORDER — LACTATED RINGERS IV BOLUS
1000.0000 mL | Freq: Once | INTRAVENOUS | Status: AC
  Administered 2023-10-20 (×2): 1000 mL via INTRAVENOUS

## 2023-10-20 MED ORDER — INSULIN GLARGINE-YFGN 100 UNIT/ML ~~LOC~~ SOLN
10.0000 [IU] | Freq: Once | SUBCUTANEOUS | Status: AC
  Administered 2023-10-20 (×2): 10 [IU] via SUBCUTANEOUS
  Filled 2023-10-20: qty 0.1

## 2023-10-20 MED ORDER — ACCU-CHEK SOFTCLIX LANCETS MISC
1 refills | Status: DC
  Filled 2023-10-20: qty 100, fill #0

## 2023-10-20 MED ORDER — INSULIN GLARGINE-YFGN 100 UNIT/ML ~~LOC~~ SOLN
10.0000 [IU] | Freq: Once | SUBCUTANEOUS | Status: DC
  Filled 2023-10-20: qty 0.1

## 2023-10-20 MED ORDER — METFORMIN HCL 500 MG PO TABS
500.0000 mg | ORAL_TABLET | Freq: Two times a day (BID) | ORAL | 0 refills | Status: DC
Start: 2023-10-20 — End: 2023-10-20
  Filled 2023-10-20: qty 180, 90d supply, fill #0

## 2023-10-20 MED ORDER — METFORMIN HCL 500 MG PO TABS
500.0000 mg | ORAL_TABLET | Freq: Two times a day (BID) | ORAL | 0 refills | Status: DC
  Filled 2023-10-20: qty 180, 90d supply, fill #0

## 2023-10-20 MED ORDER — INSULIN ASPART 100 UNIT/ML IJ SOLN
8.0000 [IU] | Freq: Once | INTRAMUSCULAR | Status: AC
  Administered 2023-10-20 (×2): 8 [IU] via SUBCUTANEOUS

## 2023-10-20 MED ORDER — ACCU-CHEK GUIDE W/DEVICE KIT
PACK | 0 refills | Status: DC
  Filled 2023-10-20: qty 1, 30d supply, fill #0

## 2023-10-20 MED ORDER — SODIUM CHLORIDE 0.9 % IV BOLUS
500.0000 mL | Freq: Once | INTRAVENOUS | Status: AC
  Administered 2023-10-20 (×2): 500 mL via INTRAVENOUS

## 2023-10-20 MED ORDER — LANTUS SOLOSTAR 100 UNIT/ML ~~LOC~~ SOPN
30.0000 [IU] | PEN_INJECTOR | Freq: Every day | SUBCUTANEOUS | 0 refills | Status: DC
  Filled 2023-10-20: qty 30, 100d supply, fill #0

## 2023-10-20 MED ORDER — LANTUS SOLOSTAR 100 UNIT/ML ~~LOC~~ SOPN
30.0000 [IU] | PEN_INJECTOR | Freq: Every day | SUBCUTANEOUS | 0 refills | Status: DC
  Filled 2023-10-20: qty 30, 100d supply, fill #0
  Filled 2023-10-21: qty 9, 30d supply, fill #0

## 2023-10-20 NOTE — Discharge Instructions (Addendum)
 Pick up your medications at the [place] [facility] in the morning.

## 2023-10-20 NOTE — Progress Notes (Signed)
 Established Patient Office Visit  Subjective   Patient ID: David Wyatt, male    DOB: 1961/02/07  Age: 63 y.o. MRN: [id number]  Chief Complaint  Patient presents with   Diabetes   Discussed the use of AI scribe software for clinical note transcription with the patient, who gave verbal consent to proceed.  History of Present Illness  States that he was in the emergency department last night and was given some insulin , but states that he has not had any other medications since then.  States that he has difficulty obtaining, and keeping his medications.  States that he is feeling very thirsty and very weak.     Past Medical History:  Diagnosis Date   Angioedema of lips 07/28/2012   left upper (07/29/2012)   Arthritis    hands and back   Chronic back pain    Cocaine abuse (HCC)    Diabetic neuropathy (HCC)    High cholesterol    Hypertension    Neuropathy    Type II diabetes mellitus (HCC)    Social History   Socioeconomic History   Marital status: Divorced    Spouse name: Not on file   Number of children: 3   Years of education: 12   Highest education level: Not on file  Occupational History    Comment: disabled  Tobacco Use   Smoking status: Every Day    Current packs/day: 0.25    Average packs/day: 0.3 packs/day for 30.0 years (7.5 ttl pk-yrs)    Types: Cigarettes   Smokeless tobacco: Former    Quit date: 08/15/2015   Tobacco comments:    04/29/16 2  cigs daily, 10/27/17 sometimes < .25 PPD  Vaping Use   Vaping status: Never Used  Substance and Sexual Activity   Alcohol use: No    Alcohol/week: 0.0 standard drinks of alcohol   Drug use: Not Currently    Types: Crack cocaine    Comment: last used in July 2025   Sexual activity: Not on file  Other Topics Concern   Not on file  Social History Narrative   04/29/17   Lives in shelter   Caffeine- a lot of  tea, coffee   Social Drivers of Corporate investment banker Strain: Not on file  Food Insecurity: Food  Insecurity Present (10/20/2023)   Hunger Vital Sign    Worried About Running Out of Food in the Last Year: Often true    Ran Out of Food in the Last Year: Often true  Transportation Needs: Unmet Transportation Needs (10/20/2023)   PRAPARE - Administrator, Civil Service (Medical): Yes    Lack of Transportation (Non-Medical): Yes  Physical Activity: Not on file  Stress: Not on file  Social Connections: Not on file  Intimate Partner Violence: Patient Declined (09/27/2023)   Humiliation, Afraid, Rape, and Kick questionnaire    Fear of Current or Ex-Partner: Patient declined    Emotionally Abused: Patient declined    Physically Abused: Patient declined    Sexually Abused: Patient declined   Family History  Problem Relation Age of Onset   Diabetes Mother    Kidney disease Mother    Hyperlipidemia Mother    Hyperlipidemia Father    Diabetes Father    Allergies  Allergen Reactions   Lisinopril  Other (See Comments) and Swelling    Lip Swelling, angioedema  angioedema  Lip Swelling  Lip Swelling, angioedema    Review of Systems  Constitutional: Negative.   HENT:  Negative.    Eyes: Negative.   Respiratory:  Negative for shortness of breath.   Cardiovascular:  Negative for chest pain.  Gastrointestinal:  Negative for abdominal pain, nausea and vomiting.  Genitourinary:  Positive for frequency.  Musculoskeletal: Negative.   Skin: Negative.   Neurological:  Positive for weakness. Negative for dizziness and headaches.  Endo/Heme/Allergies: Negative.   Psychiatric/Behavioral: Negative.        Objective:     BP (!) 167/93 (BP Location: Left Arm, Patient Position: Sitting)   Pulse 79   SpO2 94%  BP Readings from Last 3 Encounters:  10/21/23 (!) 166/78  10/20/23 (!) 154/85  10/20/23 (!) 167/93   Wt Readings from Last 3 Encounters:  10/20/23 150 lb (68 kg)  10/19/23 149 lb 4 oz (67.7 kg)  10/19/23 149 lb 4 oz (67.7 kg)    Physical Exam Vitals and nursing  note reviewed.  Constitutional:      General: He is in acute distress.     Appearance: Normal appearance.  HENT:     Head: Normocephalic and atraumatic.     Right Ear: External ear normal.     Left Ear: External ear normal.     Nose: Nose normal.     Mouth/Throat:     Mouth: Mucous membranes are moist.     Pharynx: Oropharynx is clear.  Eyes:     Extraocular Movements: Extraocular movements intact.     Conjunctiva/sclera: Conjunctivae normal.     Pupils: Pupils are equal, round, and reactive to light.  Cardiovascular:     Rate and Rhythm: Normal rate and regular rhythm.     Pulses: Normal pulses.     Heart sounds: Normal heart sounds.  Pulmonary:     Effort: Pulmonary effort is normal.     Breath sounds: Normal breath sounds.  Musculoskeletal:        General: Normal range of motion.     Cervical back: Normal range of motion and neck supple.  Skin:    General: Skin is warm and dry.  Neurological:     General: No focal deficit present.     Mental Status: He is alert and oriented to person, place, and time.        Assessment & Plan:   Problem List Items Addressed This Visit       Cardiovascular and Mediastinum   Essential hypertension, benign     Endocrine   Uncontrolled type 2 diabetes mellitus with hyperglycemia (HCC)   Hyperglycemia due to diabetes mellitus (HCC) - Primary   Relevant Orders   POCT glucose (manual entry) (Completed)   1. Hyperglycemia due to diabetes mellitus (HCC) (Primary) Glucometer read high, patient was sent to emergency department via EMS.  Patient was encouraged to return to mobile unit to help with continuation of care.  Patient understands and agrees.  Of note, patient did score high on PHQ-9, this was not addressed during his office visit due to immediate decision to have patient travel by EMS to emergency department for prompt evaluation. - POCT glucose (manual entry)  2. Essential hypertension, benign   3. Uncontrolled type 2  diabetes mellitus with hyperglycemia (HCC)    I have reviewed the patient's medical history (PMH, PSH, Social History, Family History, Medications, and allergies) , and have been updated if relevant. I spent 30 minutes reviewing chart and  face to face time with patient.    No follow-ups on file.    Kirk RAMAN Mayers, PA-C

## 2023-10-20 NOTE — ED Triage Notes (Signed)
 Patient here for 3rd visit in 3 days for ongoing hyperglycemia. Patient left [facility] on Saturday and yesterday discharged. Alert and oriented NAD. Received 500 NS pta

## 2023-10-20 NOTE — ED Provider Triage Note (Signed)
 Emergency Medicine Provider Triage Evaluation Note  David Wyatt , a 63 y.o. male  was evaluated in triage.  Pt complains of elevated blood sugar. Reports that the reading was saying high. He was seen here yesterday for elevated blood sugar. He has had 500cc of fluid. Did not take his BP or DM medications today. Denies chest pain, SOB, abdominal pain, nausea, or vomiting.   Review of Systems  Positive:  Negative:   Physical Exam  There were no vitals taken for this visit. Gen:   Awake, no distress   Resp:  Normal effort  MSK:   Moves extremities without difficulty  Other:    Medical Decision Making  Medically screening exam initiated at 4:32 PM.  Appropriate orders placed.  David Wyatt was informed that the remainder of the evaluation will be completed by another provider, this initial triage assessment does not replace that evaluation, and the importance of remaining in the ED until their evaluation is complete.  Labs ordered   David Wyatt, [place] 10/20/23 8366

## 2023-10-20 NOTE — Progress Notes (Signed)
 Pt's glucose reading read high. Pt has not been able to obtain medications from pharmacy because they are too expensive. Pt presents with SOB and inability to hold urine.

## 2023-10-20 NOTE — Transitions of Care (Post Inpatient/ED Visit) (Signed)
 10/20/2023  Name: David Wyatt MRN: [id number] DOB: 11-Jan-1961  Today's TOC FU Call Status: Today's TOC FU Call Status:: Successful TOC FU Call Completed TOC FU Call Complete Date: 10/20/23 Patient's Name and Date of Birth confirmed.  Transition Care Management Follow-up Telephone Call Date of Discharge: 10/19/23 Discharge Facility: [facility] Type of Discharge: Inpatient Admission Primary Inpatient Discharge Diagnosis:: hyperglycemia - left AMA, returned to ED 10/19/2023 How have you been since you were released from the hospital?: Same (He was sitting in the clinic when I arrived this morning. he said he left the hospital because they wouldnt give him any food.) Any questions or concerns?: Yes Patient Questions/Concerns:: He is homeless.  Instructed him to return to [facility] for assistance with housing. Multiple housing resources have been given to this patient in the past.  Bus passes [place] to [facility]. He said he has not heard from his [facility] tailored Care manager yet. I told him that I will follow up. We then tried to verify his phone number and his phone is broken and he said that the [phone] number is no  longer his phone number.  He was not sure what his new phone number is. His sister is listed as a contact and he said she can remain a contact but he does not speak to her very often. Patient Questions/Concerns Addressed: Other:  Items Reviewed: Did you receive and understand the discharge instructions provided?: Yes (No AVS, he left AMA) Medications obtained,verified, and reconciled?: Partial Review Completed Reason for Partial Mediation Review: He said he has all of his medications except the long acting insulin . He stated that his was never delivered to the [facility]. I instructed him to go down[place] to the [employer] at [facility] and request the refill of the lantus . Any new allergies since your discharge?: No Dietary orders reviewed?: Yes Type of Diet  Ordered:: heart healthy, low sodium, diabetic Do you have support at home?: No (currently homeless)  Medications Reviewed Today: Medications Reviewed Today   Medications were not reviewed in this encounter     Home Care and Equipment/Supplies: Were Home Health Services Ordered?: No Any new equipment or medical supplies ordered?: No  Functional Questionnaire: Do you need assistance with bathing/showering or dressing?: No (He is currently homeless) Do you need assistance with meal preparation?: No Do you need assistance with eating?: No Do you have difficulty maintaining continence: No Do you need assistance with getting out of bed/getting out of a chair/moving?: Yes (ambulates with a rollator) Do you have difficulty managing or taking your medications?: Yes (Being homeless, he has difficulty keeping track of his medications)  Follow up appointments reviewed: PCP Follow-up appointment confirmed?: Yes Date of PCP follow-up appointment?: 10/29/23 Follow-up Provider: [facility] Follow-up appointment confirmed?: Yes Date of Specialist follow-up appointment?: 11/05/23 Follow-Up Specialty Provider:: [facility] Do you need transportation to your follow-up appointment?:  (He usually takes the bus or the clinic can arrange a cab for him if needed) Do you understand care options if your condition(s) worsen?: Yes-patient verbalized understanding  SDOH Interventions Today    Flowsheet Row Most Recent Value  SDOH Interventions   Food Insecurity Interventions Other (Comment)  [patient was given food from the [facility] food pantry.  He is aware of free meals that are available  in the city]  Housing Interventions Other (Comment)  [instructed to go to [facility], bus passes provided]  Transportation Interventions Bus Pass Given    SIGNATURE Slater Diesel, RN

## 2023-10-20 NOTE — ED Provider Notes (Addendum)
 [place] EMERGENCY DEPARTMENT AT [facility] Provider Note   CSN: [id number] Arrival date & time: 10/20/23  1623     Patient presents with: Hyperglycemia   David Wyatt is a 63 y.o. male.   Patient sent here by clinic due to elevated blood sugar.  He is currently dealing with homelessness supposedly he is supposed to be getting his medications from [facility] pharmacy.  Patient takes metformin , NovoLog , Lantus .  He was recently admitted for high blood sugar seems like he left without being recommended.  He was seen here couple days ago for elevated blood sugar as well.  He has no symptoms at this time.  The history is provided by the patient.       Prior to Admission medications   Medication Sig Start Date End Date Taking? Authorizing Provider  Accu-Chek Softclix Lancets lancets Use to check blood sugar three times daily as directed 10/20/23   David Cornet, DO  amLODipine  (NORVASC ) 10 MG tablet Take 1 tablet (10 mg total) by mouth daily. 10/01/23   David Elsie BROCKS, MD  atorvastatin  (LIPITOR) 40 MG tablet Take 1 tablet (40 mg total) by mouth daily. 10/01/23   David Elsie BROCKS, MD  Blood Glucose Monitoring Suppl (ACCU-CHEK GUIDE) w/Device KIT Use to test blood sugar 3 times daily 10/20/23   David Chamberlin, DO  gabapentin  (NEURONTIN ) 300 MG capsule Take 1 capsule (300 mg total) by mouth 3 (three) times daily. 10/01/23   David Elsie BROCKS, MD  glucose blood (ACCU-CHEK GUIDE TEST) test strip Use as instructed to check blood sugar three times daily 10/01/23   David Elsie BROCKS, MD  LANTUS  SOLOSTAR 100 UNIT/ML Solostar Pen Inject 30 Units into the skin at bedtime. 10/20/23   David Burnham, DO  metFORMIN  (GLUCOPHAGE ) 500 MG tablet Take 1 tablet (500 mg total) by mouth 2 (two) times daily with a meal. 10/20/23   David Vogt, DO  NOVOLOG  PENFILL cartridge Inject 10 Units into the skin 3 (three) times daily with meals. 07/29/23   [provider]    Allergies:  Lisinopril     Review of Systems  Updated Vital Signs BP (!) 166/93   Pulse (!) 58   Temp 98 F (36.7 C) (Oral)   Resp 17   Ht 5' 8 (1.727 m)   Wt 68 kg   SpO2 100%   BMI 22.81 kg/m   Physical Exam Vitals and nursing note reviewed.  Constitutional:      General: He is not in acute distress.    Appearance: He is well-developed. He is not ill-appearing.  HENT:     Head: Normocephalic and atraumatic.     Nose: Nose normal.     Mouth/Throat:     Mouth: Mucous membranes are moist.  Eyes:     Extraocular Movements: Extraocular movements intact.     Conjunctiva/sclera: Conjunctivae normal.     Pupils: Pupils are equal, round, and reactive to light.  Cardiovascular:     Rate and Rhythm: Normal rate and regular rhythm.     Pulses: Normal pulses.     Heart sounds: Normal heart sounds. No murmur heard. Pulmonary:     Effort: Pulmonary effort is normal. No respiratory distress.     Breath sounds: Normal breath sounds.  Abdominal:     General: Abdomen is flat.     Palpations: Abdomen is soft.     Tenderness: There is no abdominal tenderness.  Musculoskeletal:        General: No swelling.  Cervical back: Normal range of motion and neck supple.  Skin:    General: Skin is warm and dry.     Capillary Refill: Capillary refill takes less than 2 seconds.  Neurological:     General: No focal deficit present.     Mental Status: He is alert.  Psychiatric:        Mood and Affect: Mood normal.     (all labs ordered are listed, but only abnormal results are displayed) Labs Reviewed  CBC WITH DIFFERENTIAL/PLATELET - Abnormal; Notable for the following components:      Result Value   RBC 4.16 (*)    All other components within normal limits  COMPREHENSIVE METABOLIC PANEL WITH GFR - Abnormal; Notable for the following components:   Sodium 130 (*)    CO2 21 (*)    Glucose, Bld 628 (*)    BUN 24 (*)    Creatinine, Ser 1.47 (*)    Calcium  8.8 (*)    Albumin 3.0 (*)    GFR,  Estimated 53 (*)    All other components within normal limits  URINALYSIS, ROUTINE W REFLEX MICROSCOPIC - Abnormal; Notable for the following components:   Color, Urine COLORLESS (*)    Glucose, UA >=500 (*)    All other components within normal limits  I-STAT VENOUS BLOOD GAS, ED - Abnormal; Notable for the following components:   pCO2, Ven 32.7 (*)    pO2, Ven 121 (*)    Sodium 131 (*)    Calcium , Ion 1.12 (*)    All other components within normal limits  CBG MONITORING, ED - Abnormal; Notable for the following components:   Glucose-Capillary >600 (*)    All other components within normal limits  CBG MONITORING, ED - Abnormal; Notable for the following components:   Glucose-Capillary 474 (*)    All other components within normal limits  BETA-HYDROXYBUTYRIC ACID  CBG MONITORING, ED    EKG: None  Radiology: No results found.   Procedures   Medications Ordered in the ED  lactated ringers  bolus 1,000 mL (0 mLs Intravenous Stopped 10/20/23 1920)  insulin  aspart (novoLOG ) injection 8 Units (8 Units Subcutaneous Given 10/20/23 2136)  sodium chloride  0.9 % bolus 500 mL (500 mLs Intravenous New Bag/Given 10/20/23 2146)                                    Medical Decision Making Risk OTC drugs. Prescription drug management.   David Wyatt is here with hyperglycemia from clinic.  Was admitted to the hospital several days ago for hyperglycemia.  Went to clinic today was found to have blood sugar that read high.  Was sent here for evaluation.  Ultimately his lab work does not show that he is in DKA.  Blood  sugar 628 and after fluid bolus is already down to 474.  Will give him a dose of NovoLog  8 units.  Ultimately I talked with social work and case management.  He has insurance that should cover his insulin .  I sent the prescription of Lantus  and metformin  to transitions of care pharmacy.  Ketones were negative.  No leukocytosis anemia or electrolyte abnormality otherwise.   Urinalysis negative for infection.  Patient given fluid bolus and insulin .   Blood sugar improved patient.  Patient asymptomatic.  Understands to pick up his medications in the morning.  This chart was dictated using voice recognition software.  Despite best  efforts to proofread,  errors can occur which can change the documentation meaning.      Final diagnoses:  Hyperglycemia    ED Discharge Orders          Ordered    LANTUS  SOLOSTAR 100 UNIT/ML Solostar Pen  Daily at bedtime,   Status:  Discontinued        10/20/23 2124    metFORMIN  (GLUCOPHAGE ) 500 MG tablet  2 times daily with meals,   Status:  Discontinued        10/20/23 2124    LANTUS  SOLOSTAR 100 UNIT/ML Solostar Pen  Daily at bedtime        10/20/23 2154    metFORMIN  (GLUCOPHAGE ) 500 MG tablet  2 times daily with meals        10/20/23 2154    Accu-Chek Softclix Lancets lancets        10/20/23 2154    Blood Glucose Monitoring Suppl (ACCU-CHEK GUIDE) w/Device KIT        10/20/23 2154               David Cornet, DO 10/20/23 2241    David Cornet, DO 10/20/23 2241

## 2023-10-21 ENCOUNTER — Emergency Department (HOSPITAL_COMMUNITY)
Admission: EM | Admit: 2023-10-21 | Discharge: 2023-10-22 | Disposition: A | Discharge status: Home / Self Care | Payer: MEDICAID | Source: Home / Self Care | Attending: Emergency Medicine | Admitting: Emergency Medicine

## 2023-10-21 ENCOUNTER — Encounter: Payer: Self-pay | Admitting: Physician Assistant

## 2023-10-21 ENCOUNTER — Other Ambulatory Visit: Payer: Self-pay

## 2023-10-21 ENCOUNTER — Other Ambulatory Visit (HOSPITAL_COMMUNITY): Payer: Self-pay

## 2023-10-21 ENCOUNTER — Ambulatory Visit (HOSPITAL_COMMUNITY)
Admission: EM | Admit: 2023-10-21 | Discharge: 2023-10-21 | Disposition: A | Discharge status: Home / Self Care | Payer: MEDICAID | Attending: Family Medicine | Admitting: Family Medicine

## 2023-10-21 ENCOUNTER — Telehealth: Payer: Self-pay

## 2023-10-21 ENCOUNTER — Encounter (HOSPITAL_COMMUNITY): Payer: Self-pay

## 2023-10-21 DIAGNOSIS — Z91128 Patient's intentional underdosing of medication regimen for other reason: Secondary | ICD-10-CM | POA: Insufficient documentation

## 2023-10-21 DIAGNOSIS — Z789 Other specified health status: Secondary | ICD-10-CM

## 2023-10-21 DIAGNOSIS — R739 Hyperglycemia, unspecified: Secondary | ICD-10-CM

## 2023-10-21 DIAGNOSIS — Z91148 Patient's other noncompliance with medication regimen for other reason: Secondary | ICD-10-CM | POA: Insufficient documentation

## 2023-10-21 DIAGNOSIS — I10 Essential (primary) hypertension: Secondary | ICD-10-CM | POA: Insufficient documentation

## 2023-10-21 DIAGNOSIS — F322 Major depressive disorder, single episode, severe without psychotic features: Secondary | ICD-10-CM | POA: Diagnosis not present

## 2023-10-21 DIAGNOSIS — E1140 Type 2 diabetes mellitus with diabetic neuropathy, unspecified: Secondary | ICD-10-CM | POA: Diagnosis not present

## 2023-10-21 DIAGNOSIS — E1110 Type 2 diabetes mellitus with ketoacidosis without coma: Secondary | ICD-10-CM | POA: Diagnosis not present

## 2023-10-21 DIAGNOSIS — E1165 Type 2 diabetes mellitus with hyperglycemia: Secondary | ICD-10-CM | POA: Insufficient documentation

## 2023-10-21 DIAGNOSIS — Z7984 Long term (current) use of oral hypoglycemic drugs: Secondary | ICD-10-CM | POA: Insufficient documentation

## 2023-10-21 DIAGNOSIS — Z91199 Patient's noncompliance with other medical treatment and regimen due to unspecified reason: Secondary | ICD-10-CM | POA: Diagnosis not present

## 2023-10-21 DIAGNOSIS — Z794 Long term (current) use of insulin: Secondary | ICD-10-CM | POA: Insufficient documentation

## 2023-10-21 DIAGNOSIS — Z79899 Other long term (current) drug therapy: Secondary | ICD-10-CM | POA: Diagnosis not present

## 2023-10-21 DIAGNOSIS — R45851 Suicidal ideations: Secondary | ICD-10-CM | POA: Diagnosis not present

## 2023-10-21 DIAGNOSIS — R638 Other symptoms and signs concerning food and fluid intake: Secondary | ICD-10-CM | POA: Diagnosis present

## 2023-10-21 DIAGNOSIS — Z5902 Unsheltered homelessness: Secondary | ICD-10-CM | POA: Insufficient documentation

## 2023-10-21 DIAGNOSIS — F32A Depression, unspecified: Secondary | ICD-10-CM | POA: Insufficient documentation

## 2023-10-21 DIAGNOSIS — T383X6A Underdosing of insulin and oral hypoglycemic [antidiabetic] drugs, initial encounter: Secondary | ICD-10-CM | POA: Insufficient documentation

## 2023-10-21 DIAGNOSIS — E785 Hyperlipidemia, unspecified: Secondary | ICD-10-CM | POA: Diagnosis not present

## 2023-10-21 DIAGNOSIS — Z5982 Transportation insecurity: Secondary | ICD-10-CM | POA: Insufficient documentation

## 2023-10-21 LAB — I-STAT CHEM 8, ED
BUN: 27 mg/dL — ABNORMAL HIGH (ref 8–23)
Calcium, Ion: 1.21 mmol/L (ref 1.15–1.40)
Chloride: 101 mmol/L (ref 98–111)
Creatinine, Ser: 1 mg/dL (ref 0.61–1.24)
Glucose, Bld: 658 mg/dL (ref 70–99)
HCT: 43 % (ref 39.0–52.0)
Hemoglobin: 14.6 g/dL (ref 13.0–17.0)
Potassium: 4.4 mmol/L (ref 3.5–5.1)
Sodium: 132 mmol/L — ABNORMAL LOW (ref 135–145)
TCO2: 20 mmol/L — ABNORMAL LOW (ref 22–32)

## 2023-10-21 LAB — CBG MONITORING, ED
Glucose-Capillary: 126 mg/dL — ABNORMAL HIGH (ref 70–99)
Glucose-Capillary: 223 mg/dL — ABNORMAL HIGH (ref 70–99)
Glucose-Capillary: 408 mg/dL — ABNORMAL HIGH (ref 70–99)
Glucose-Capillary: 422 mg/dL — ABNORMAL HIGH (ref 70–99)
Glucose-Capillary: >600 mg/dL (ref 70–99)

## 2023-10-21 LAB — CBC
HCT: 40.1 % (ref 39.0–52.0)
Hemoglobin: 13.3 g/dL (ref 13.0–17.0)
MCH: 31.1 pg (ref 26.0–34.0)
MCHC: 33.2 g/dL (ref 30.0–36.0)
MCV: 93.9 fL (ref 80.0–100.0)
Platelets: 293 K/uL (ref 150–400)
RBC: 4.27 MIL/uL (ref 4.22–5.81)
RDW: 12.9 % (ref 11.5–15.5)
WBC: 6.7 K/uL (ref 4.0–10.5)
nRBC: 0 % (ref 0.0–0.2)

## 2023-10-21 LAB — BASIC METABOLIC PANEL WITH GFR
Anion gap: 12 (ref 5–15)
BUN: 27 mg/dL — ABNORMAL HIGH (ref 8–23)
CO2: 21 mmol/L — ABNORMAL LOW (ref 22–32)
Calcium: 9.1 mg/dL (ref 8.9–10.3)
Chloride: 100 mmol/L (ref 98–111)
Creatinine, Ser: 1.24 mg/dL (ref 0.61–1.24)
GFR, Estimated: >60 mL/min (ref >60)
Glucose, Bld: 501 mg/dL (ref 70–99)
Potassium: 4.1 mmol/L (ref 3.5–5.1)
Sodium: 133 mmol/L — ABNORMAL LOW (ref 135–145)

## 2023-10-21 LAB — GLUCOSE, CAPILLARY: Glucose-Capillary: 523 mg/dL (ref 70–99)

## 2023-10-21 LAB — URINALYSIS, ROUTINE W REFLEX MICROSCOPIC
Bacteria, UA: NONE SEEN
Bilirubin Urine: NEGATIVE
Glucose, UA: >=500 mg/dL — AB
Hgb urine dipstick: NEGATIVE
Ketones, ur: NEGATIVE mg/dL
Leukocytes,Ua: NEGATIVE
Nitrite: NEGATIVE
Protein, ur: NEGATIVE mg/dL
Specific Gravity, Urine: 1.021 (ref 1.005–1.030)
pH: 6 (ref 5.0–8.0)

## 2023-10-21 MED ORDER — LACTATED RINGERS IV BOLUS
1000.0000 mL | Freq: Once | INTRAVENOUS | Status: AC
  Administered 2023-10-21 (×2): 1000 mL via INTRAVENOUS

## 2023-10-21 MED ORDER — INSULIN ASPART 100 UNIT/ML IJ SOLN
0.0000 [IU] | Freq: Three times a day (TID) | INTRAMUSCULAR | Status: DC
  Administered 2023-10-21 (×2): 15 [IU] via SUBCUTANEOUS
  Filled 2023-10-21: qty 0.15

## 2023-10-21 NOTE — Telephone Encounter (Addendum)
 The patient came to the clinic today and stated he wants to go to behavioral health.  I offered a bus pass for him to get to [facility] and he said they tell him that he is not sick enough.  He then said he should just say he is suicidal.  I asked him if he was suicidal and he said no.  I told him that I am going to call [employer] and request the Behavioral health response team come to assess him and take him to [facility] and he was in agreement.    I called non-emergent [employer] and explained the patient's current status and placed the request for BHRT and was told that they would send someone as soon as possible.  I informed the patient that BHRT would be coming and he was in agreement to waiting for their officer.   While the patient is in the clinic, I reviewed his medication list with him and he has 3 lantus  pens that he just picked up from the pharmacy.  However, he has no place to refrigerate the pens that he is not using, He said that all of the other medications, including the novolog  are in his storage unit.  He said he has a key to the storage unit but can't get there.  I explained that I can get him a bus pass to get there but told him that BHRT is coming to assist him.

## 2023-10-21 NOTE — ED Provider Notes (Addendum)
 Behavioral Health Urgent Care Medical Screening Exam  Patient Name: David Wyatt MRN: [id number] Date of Evaluation: 10/21/23 Chief Complaint:  I need someone to talk to Diagnosis:  Final diagnoses:  Unsheltered homelessness  Need for community resource  Type 2 diabetes mellitus with hyperglycemia, with long-term current use of insulin  (HCC)  Poor compliance with medication  Depression, unspecified depression type    History of Present illness: David Wyatt is a 63 y.o. male,   Patient presented to [facility] as a walk in, unaccompanied reporting passive SI over.  He denies however currently denies any suicidal ideation and reports that he needs to speak with somebody.  Patient has an extensive history of malingering inappropriately using emergency medical services and was recently admitted as an inpatient medical patient due to hyperglycemia and concern for DKA and left the ED AGAINST MEDICAL ADVICE.  David Wyatt, 63 y.o., male patient seen face to face by this provider, consulted with Dr. Cole; and chart reviewed on 10/21/23.    On evaluation David Wyatt reports that he called law enforcement to bring him here to [facility] as he felt that he needed to talk someone.  Patient currently has no outpatient follow-up for any psychiatric conditions.  Patient denies a psychiatric history.  Although on chart review appears to have a vague history of MDD.  Patient admits that he is frustrated with life, as he is homeless.  He denies any recent substance use.  Denies any auditory or visual hallucinations.  Denies any active suicidal or homicidal ideations or self-harm.  Discussed with patient the option of outpatient treatment such as therapy and medication management he states he does not have transportation.  Patient states that he just wanted to be admitted and is tired. Patient's blood glucose was checked while here at [facility]-glucose 523, pt reports he has only two candy bars today but has his  insulin  in the security locker. He endorses that he has not eaten a meal today. Denies any other acute concerns.  Patient in sitting in chair and with his head laying on table in no acute distress.  He is alert/oriented x 4; calm/cooperative; and mood congruent with affect.  He is speaking in a clear tone at moderate volume, and normal pace; with fair eye contact.  His thought process is coherent and relevant; There is no indication that patient is  currently responding to internal/external stimuli or experiencing delusional thought content; and he denies suicidal or homicidal ideations.  Patient does not appear delusional, paranoid, or exhibit any signs of psychosis. Patient has remained calm throughout assessment and has answered questions appropriately.  Patient does not meet inpatient psychiatric treatment criteria.    Flowsheet Row ED from 10/21/2023 in [facility] ED from 10/20/2023 in [facility] Emergency Department at [facility] ED from 10/19/2023 in [facility] Emergency Department at [facility]  C-SSRS RISK CATEGORY Low Risk No Risk No Risk    Psychiatric Specialty Exam  Presentation  General Appearance:Disheveled  Eye Contact:Fair  Speech:Clear and Coherent  Speech Volume:Normal  Handedness:Right   Mood and Affect  Mood: Dysphoric  Affect: Congruent   Thought Process  Thought Processes: Coherent  Descriptions of Associations:Intact  Orientation:Full (Time, Place and Person)  Thought Content:WDL  Diagnosis of Schizophrenia or Schizoaffective disorder in past: No   Hallucinations:None  Ideas of Reference:None  Suicidal Thoughts:No Without Intent; Without Plan  Homicidal Thoughts:No   Sensorium  Memory: Immediate Good; Recent Good  Judgment: Good  [employer]: Armed forces training and education officer  Concentration: Fair  Attention Span: Fair  Recall: [employer] of  Knowledge: Fair  Language: Fair   Psychomotor Activity  Psychomotor Activity: Normal   Assets  Assets: Desire for Improvement; Housing   Sleep  Sleep: Fair  Number of hours:  7   Physical Exam: Physical Exam Constitutional:      General: He is not in acute distress.    Appearance: Normal appearance. He is not ill-appearing.  HENT:     Head: Normocephalic and atraumatic.     Nose: Nose normal.  Eyes:     Extraocular Movements: Extraocular movements intact.     Conjunctiva/sclera: Conjunctivae normal.     Pupils: Pupils are equal, round, and reactive to light.  Cardiovascular:     Rate and Rhythm: Normal rate and regular rhythm.  Pulmonary:     Effort: Pulmonary effort is normal.     Breath sounds: Normal breath sounds.  Musculoskeletal:     Cervical back: Normal range of motion and neck supple.  Skin:    General: Skin is warm and dry.  Neurological:     Mental Status: He is alert.     Gait: Gait abnormal.     Comments: Ambulates with a walker    Review of Systems  Psychiatric/Behavioral:  Positive for depression. Negative for suicidal ideas. The patient is nervous/anxious.      Blood pressure (!) 166/78, pulse 67, temperature 98.6 F (37 C), temperature source Oral, resp. rate 18, SpO2 99%. There is no height or weight on file to calculate BMI.  Musculoskeletal: Strength & Muscle Tone: within normal limits Gait & Station: normal Patient leans: N/A   [facility] MSE Discharge Disposition for Follow up and Recommendations: Based on my evaluation the patient does not appear to have an emergency medical condition and can be discharged with resources and follow up care in outpatient services for Medication Management, Individual Therapy, and patient provided resources for outpatient:   - Patient does not meet inpatient psychiatric treatment criteria and appears to be seeking secondary gain of shelter patient was given a sandwich and a bus pass along with  resources for outpatient follow-up and discharge.  While future psychiatric events cannot be accurately predicted, the patient does not currently require acute inpatient psychiatric care and does not currently meet [facility]  involuntary commitment criteria. Patient was able to engage in safety planning including plan to return to [facility] or contact emergency services if he feels unable to maintain his own safety or the safety of others.   - Type 2 diabetes with hyperglycemia, discussed with patient administering his insulin  as prescribed he has insulin  here at our facility that he just picked up from the pharmacy in his locker advised patient to administer his insulin , recheck his blood sugar and be sure to eat the meal that was provided.  Discharge.  Patient verbalized understanding and agreement with plan and advised to go to the ED if blood sugar does not improve.     Suzen Lesches, NP-C 10/21/2023, 3:56 PM

## 2023-10-21 NOTE — Discharge Instructions (Addendum)
 Give yourself insulin  as prescribed by your PCP. You were given food so eat immediately after administering your insulin .  Schedule follow-up with your primary care doctor within 3 to 5 days to address poorly controlled diabetes  -I have included two resources for you to receive outpatient therapy services and psychiatric medication management if you desire to receive it.  Family services other [facility] or he can return here to [facility] second floor to establish an open access clinic.

## 2023-10-21 NOTE — ED Triage Notes (Signed)
 Patient BIB [facility] from home. Stated his sugar is 510. Unable to get to his insulin . Recently homeless over the last 3 weeks. Increased urination. No vomiting. Discharged this morning.

## 2023-10-21 NOTE — Progress Notes (Signed)
   10/21/23 1050  [facility] Triage Screening (Walk-ins at [facility] only)  What Is the Reason for Your Visit/Call Today? Patient is a 63 year old male who presents to [facility] voluntarily accompanied byBHRT.  Patient is homeless and states that he just does not want to go on.  Patient endorses patient endorses suicidal thoughts without intent or plan.  He denies H/AVH or alcohol use.  Patient states that he has not used crack and about 3 weeks to a month.  Patient states that happens someone to talk to be helpful for him.  How Long Has This Been Causing You Problems? 1-6 months  Have You Recently Had Any Thoughts About Hurting Yourself? Yes  Are You Planning to Commit Suicide/Harm Yourself At This time? Yes  Have you Recently Had Thoughts About Hurting Someone [name]? No  Are You Planning To Harm Someone At This Time? No  Physical Abuse Denies  Verbal Abuse Denies  Sexual Abuse Denies  Exploitation of patient/patient's resources Denies  Self-Neglect Denies  Possible abuse reported to:  (N/A)  Are you currently experiencing any auditory, visual or other hallucinations? No  Have You Used Any Alcohol or Drugs in the Past 24 Hours? No  Clinician description of patient physical appearance/behavior: Patient was dirty, calm and cooperative  What Do You Feel Would Help You the Most Today? Treatment for Depression or other mood problem  Determination of Need Urgent (48 hours)  Options For Referral Inpatient Hospitalization;Medication Management;Outpatient Therapy  Determination of Need filed? Yes

## 2023-10-21 NOTE — ED Provider Notes (Signed)
  EMERGENCY DEPARTMENT AT [facility] Provider Note   CSN: [id number] Arrival date & time: 10/21/23  1719     History  Chief Complaint  Patient presents with   Hyperglycemia    David Wyatt is a 63 y.o. male with PMH as listed below who presents with hyperglycemia.  He states he knows his sugars are because he is urinating a lot.  He noticed this at 4 PM today.  Patient states otherwise feels fine, no shortness of breath, nausea vomiting diarrhea or abdominal pain.  His chief complaint is that he is very hungry right now and he is very frustrated with the wait time today. He states he hasn't picked up his insulin  yet but that he will go tomorrow. Unclear how long patient hasn't been taking his insulin . When asked what his insulin  doses are, he states 3U short acting TID, but it is prescribed as 10 U TID. He states his long acting is 10 U at night, but it is prescribed as 30U at bedtime.    Past Medical History:  Diagnosis Date   Angioedema of lips 07/28/2012   left upper (07/29/2012)   Arthritis    hands and back   Chronic back pain    Cocaine abuse (HCC)    Diabetic neuropathy (HCC)    High cholesterol    Hypertension    Neuropathy    Type II diabetes mellitus (HCC)        Home Medications Prior to Admission medications   Medication Sig Start Date End Date Taking? Authorizing Provider  Accu-Chek Softclix Lancets lancets Use to check blood sugar three times daily as directed 10/20/23   Curatolo, Adam, DO  amLODipine  (NORVASC ) 10 MG tablet Take 1 tablet (10 mg total) by mouth daily. 10/01/23   Lue Elsie BROCKS, MD  atorvastatin  (LIPITOR) 40 MG tablet Take 1 tablet (40 mg total) by mouth daily. 10/01/23   Lue Elsie BROCKS, MD  Blood Glucose Monitoring Suppl (ACCU-CHEK GUIDE) w/Device KIT Use to test blood sugar 3 times daily 10/20/23   Curatolo, Adam, DO  gabapentin  (NEURONTIN ) 300 MG capsule Take 1 capsule (300 mg total) by mouth 3 (three) times  daily. 10/01/23   Lue Elsie BROCKS, MD  glucose blood (ACCU-CHEK GUIDE TEST) test strip Use as instructed to check blood sugar three times daily 10/01/23   Lue Elsie BROCKS, MD  LANTUS  SOLOSTAR 100 UNIT/ML Solostar Pen Inject 30 Units into the skin at bedtime. 10/20/23   Curatolo, Adam, DO  metFORMIN  (GLUCOPHAGE ) 500 MG tablet Take 1 tablet (500 mg total) by mouth 2 (two) times daily with a meal. 10/20/23   Curatolo, Adam, DO  NOVOLOG  PENFILL cartridge Inject 10 Units into the skin 3 (three) times daily with meals. 07/29/23   [provider]      Allergies    Lisinopril     Review of Systems   Review of Systems A 10 point review of systems was performed and is negative unless otherwise reported in HPI.  Physical Exam Updated Vital Signs BP 136/87   Pulse 88   Temp 98 F (36.7 C)   Resp 18   Ht 5' 8 (1.727 m)   Wt 68 kg   SpO2 96%   BMI 22.79 kg/m  Physical Exam General: Normal appearing male, lying in bed.  HEENT: PERRLA, Sclera anicteric, MMM, trachea midline.  Cardiology: RRR, no murmurs/rubs/gallops.  Resp: Normal respiratory rate and effort. CTAB, no wheezes, rhonchi, crackles.  Abd: Soft, non-tender, non-distended. No  rebound tenderness or guarding.  GU: Deferred. MSK: No peripheral edema or signs of trauma. Extremities without deformity or TTP. No cyanosis or clubbing. Skin: warm, dry.  Neuro: A&Ox4, CNs II-XII grossly intact. MAEs. Sensation grossly intact.  Psych: Normal mood and affect.   ED Results / Procedures / Treatments   Labs (all labs ordered are listed, but only abnormal results are displayed) Labs Reviewed  URINALYSIS, ROUTINE W REFLEX MICROSCOPIC - Abnormal; Notable for the following components:      Result Value   Color, Urine COLORLESS (*)    Glucose, UA >=500 (*)    All other components within normal limits  BASIC METABOLIC PANEL WITH GFR - Abnormal; Notable for the following components:   Sodium 133 (*)    CO2 21 (*)    Glucose, Bld  501 (*)    BUN 27 (*)    All other components within normal limits  CBG MONITORING, ED - Abnormal; Notable for the following components:   Glucose-Capillary >600 (*)    All other components within normal limits  I-STAT CHEM 8, ED - Abnormal; Notable for the following components:   Sodium 132 (*)    BUN 27 (*)    Glucose, Bld 658 (*)    TCO2 20 (*)    All other components within normal limits  CBG MONITORING, ED - Abnormal; Notable for the following components:   Glucose-Capillary 408 (*)    All other components within normal limits  CBG MONITORING, ED - Abnormal; Notable for the following components:   Glucose-Capillary 422 (*)    All other components within normal limits  CBG MONITORING, ED - Abnormal; Notable for the following components:   Glucose-Capillary 223 (*)    All other components within normal limits  CBG MONITORING, ED - Abnormal; Notable for the following components:   Glucose-Capillary 126 (*)    All other components within normal limits  CBC    EKG None  Radiology No results found.  Procedures Procedures    Medications Ordered in ED Medications  lactated ringers  bolus 1,000 mL (1,000 mLs Intravenous New Bag/Given 10/21/23 1956)    ED Course/ Medical Decision Making/ A&P                          Medical Decision Making Amount and/or Complexity of Data Reviewed Labs: ordered. Decision-making details documented in ED Course.  Risk Prescription drug management.    This patient presents to the ED for concern of hyperglycemia, this involves an extensive number of treatment options, and is a complaint that carries with it a high risk of complications and morbidity.  I considered the following differential and admission for this acute, potentially life threatening condition.   MDM:    Pt with hyperglycemia, otherwise feels well, mild polyuria. No UTI on UA. No signs of DKA. He is well-appearing, chief complaint is hunger. Initial glucose >600. After  fluids, then glucose 422. Given 15U of insulin  per SSI.   Clinical Course as of 10/21/23 2359  Tue Oct 21, 2023  2037 Urinalysis, Routine w reflex microscopic -Urine, Clean Catch(!) No UTI [HN]  2037 Glucose-Capillary(!!): >600 [HN]  2037 Glucose(!!): 658 From Istat, awiating BMP [HN]  2037 CBC wnl [HN]  2050 Anion gap: 12 No AG, K 4.1, giving 15 U insulin  SQ [HN]  2245 Glucose-Capillary(!): 223 [HN]  2354 Glucose-Capillary(!): 126 Eating sandwich [HN]    Clinical Course User Index [HN] Franklyn Sid SAILOR, MD  Labs: I Ordered, and personally interpreted labs.  The pertinent results include:  those listed above  Additional history obtained from chart review.   Reevaluation: After the interventions noted above, I reevaluated the patient and found that they have :improved  Social Determinants of Health: Lives independently  Disposition:  Patient is signed out to the oncoming ED physician Dr. Darra who is made aware of his history, presentation, exam, workup, and plan. Plan is to obtain a repeat glucose and likely DC.   Co morbidities that complicate the patient evaluation  Past Medical History:  Diagnosis Date   Angioedema of lips 07/28/2012   left upper (07/29/2012)   Arthritis    hands and back   Chronic back pain    Cocaine abuse (HCC)    Diabetic neuropathy (HCC)    High cholesterol    Hypertension    Neuropathy    Type II diabetes mellitus (HCC)      Medicines Meds ordered this encounter  Medications   lactated ringers  bolus 1,000 mL   DISCONTD: insulin  aspart (novoLOG ) injection 0-15 Units    Correction coverage::   Moderate (average weight, post-op)    CBG < 70::   Implement Hypoglycemia Standing Orders and refer to Hypoglycemia Standing Orders sidebar report    CBG 70 - 120::   0 units    CBG 121 - 150::   2 units    CBG 151 - 200::   3 units    CBG 201 - 250::   5 units    CBG 251 - 300::   8 units    CBG 301 - 350::   11 units    CBG 351 - 400::    15 units    CBG > 400:   call MD and obtain STAT lab verification    I have reviewed the patients home medicines and have made adjustments as needed  Problem List / ED Course: Problem List Items Addressed This Visit       Other   Hyperglycemia - Primary                This note was created using dictation software, which may contain spelling or grammatical errors.    Franklyn Sid SAILOR, MD 10/22/23 0000

## 2023-10-21 NOTE — Telephone Encounter (Signed)
 Officer Evans and [employer] arrived and inquired if the patient has been connected with [employer]. I explained that David Wyatt has been trying to reach him but he has problems with his phone. His current phone is broken.  I also explained that he was referred to Aloysius Ford, RN/Congregational Nurse at the [facility] for assistance with contacting [employer].    Ebony explained that they will help him get in contact with [employer].  I gave her the contact information for Tawni Martin/ [employer] : [phone] and explained that they have been trying to reach the patient to assign him a tailored care manager. Ebony also stated that [facility] can help him get a new phone.    I explained to the patient that [facility] was here to assist him and he willingly left the clinic with Bobetta and Forensic psychologist

## 2023-10-21 NOTE — Discharge Instructions (Addendum)
 Thank you for coming to [facility] Emergency Department. You were seen for hyperglycemia (high blood sugar). We did an exam, labs, and imaging, and these showed blood sugar >600 mg/dL. We gave you 15 U insulin  with improvement. Please take your insulin  as instructed at home.  Please follow up with your primary care provider within 1 week.   Do not hesitate to return to the ED or call 911 if you experience: -Worsening symptoms -Lightheadedness, passing out -Fevers/chills -Anything else that concerns you

## 2023-10-22 ENCOUNTER — Observation Stay (HOSPITAL_COMMUNITY)
Admission: EM | Admit: 2023-10-22 | Discharge: 2023-10-24 | Disposition: A | Discharge status: Home / Self Care | Payer: MEDICAID | Attending: Family Medicine | Admitting: Family Medicine

## 2023-10-22 ENCOUNTER — Other Ambulatory Visit: Payer: Self-pay

## 2023-10-22 DIAGNOSIS — Z794 Long term (current) use of insulin: Secondary | ICD-10-CM | POA: Insufficient documentation

## 2023-10-22 DIAGNOSIS — E1110 Type 2 diabetes mellitus with ketoacidosis without coma: Principal | ICD-10-CM | POA: Insufficient documentation

## 2023-10-22 DIAGNOSIS — Z79899 Other long term (current) drug therapy: Secondary | ICD-10-CM | POA: Insufficient documentation

## 2023-10-22 DIAGNOSIS — F332 Major depressive disorder, recurrent severe without psychotic features: Secondary | ICD-10-CM | POA: Diagnosis present

## 2023-10-22 DIAGNOSIS — R4589 Other symptoms and signs involving emotional state: Secondary | ICD-10-CM | POA: Insufficient documentation

## 2023-10-22 DIAGNOSIS — R45851 Suicidal ideations: Secondary | ICD-10-CM | POA: Insufficient documentation

## 2023-10-22 DIAGNOSIS — E1140 Type 2 diabetes mellitus with diabetic neuropathy, unspecified: Secondary | ICD-10-CM | POA: Insufficient documentation

## 2023-10-22 DIAGNOSIS — Z91199 Patient's noncompliance with other medical treatment and regimen due to unspecified reason: Secondary | ICD-10-CM | POA: Insufficient documentation

## 2023-10-22 DIAGNOSIS — R739 Hyperglycemia, unspecified: Secondary | ICD-10-CM | POA: Diagnosis present

## 2023-10-22 DIAGNOSIS — Z91148 Patient's other noncompliance with medication regimen for other reason: Secondary | ICD-10-CM

## 2023-10-22 DIAGNOSIS — Z789 Other specified health status: Secondary | ICD-10-CM

## 2023-10-22 DIAGNOSIS — E785 Hyperlipidemia, unspecified: Secondary | ICD-10-CM | POA: Insufficient documentation

## 2023-10-22 DIAGNOSIS — F322 Major depressive disorder, single episode, severe without psychotic features: Secondary | ICD-10-CM | POA: Insufficient documentation

## 2023-10-22 DIAGNOSIS — E1165 Type 2 diabetes mellitus with hyperglycemia: Principal | ICD-10-CM | POA: Diagnosis present

## 2023-10-22 DIAGNOSIS — I10 Essential (primary) hypertension: Secondary | ICD-10-CM | POA: Insufficient documentation

## 2023-10-22 LAB — URINALYSIS, ROUTINE W REFLEX MICROSCOPIC
Bacteria, UA: NONE SEEN
Bacteria, UA: NONE SEEN
Bilirubin Urine: NEGATIVE
Bilirubin Urine: NEGATIVE
Glucose, UA: >=500 mg/dL — AB
Glucose, UA: >=500 mg/dL — AB
Hgb urine dipstick: NEGATIVE
Hgb urine dipstick: NEGATIVE
Ketones, ur: NEGATIVE mg/dL
Ketones, ur: NEGATIVE mg/dL
Leukocytes,Ua: NEGATIVE
Leukocytes,Ua: NEGATIVE
Nitrite: NEGATIVE
Nitrite: NEGATIVE
Protein, ur: NEGATIVE mg/dL
Protein, ur: NEGATIVE mg/dL
Specific Gravity, Urine: 1.024 (ref 1.005–1.030)
Specific Gravity, Urine: 1.02 (ref 1.005–1.030)
pH: 6 (ref 5.0–8.0)
pH: 7 (ref 5.0–8.0)

## 2023-10-22 LAB — BASIC METABOLIC PANEL WITH GFR
Anion gap: 15 (ref 5–15)
BUN: 21 mg/dL (ref 8–23)
CO2: 20 mmol/L — ABNORMAL LOW (ref 22–32)
Calcium: 9.1 mg/dL (ref 8.9–10.3)
Chloride: 93 mmol/L — ABNORMAL LOW (ref 98–111)
Creatinine, Ser: 1.28 mg/dL — ABNORMAL HIGH (ref 0.61–1.24)
GFR, Estimated: >60 mL/min (ref >60)
Glucose, Bld: 867 mg/dL (ref 70–99)
Potassium: 4.2 mmol/L (ref 3.5–5.1)
Sodium: 128 mmol/L — ABNORMAL LOW (ref 135–145)

## 2023-10-22 LAB — I-STAT VENOUS BLOOD GAS, ED
Acid-base deficit: 9 mmol/L — ABNORMAL HIGH (ref 0.0–2.0)
Bicarbonate: 15.9 mmol/L — ABNORMAL LOW (ref 20.0–28.0)
Calcium, Ion: 1.06 mmol/L — ABNORMAL LOW (ref 1.15–1.40)
HCT: 40 % (ref 39.0–52.0)
Hemoglobin: 13.6 g/dL (ref 13.0–17.0)
O2 Saturation: 99 %
Potassium: 4.1 mmol/L (ref 3.5–5.1)
Sodium: 128 mmol/L — ABNORMAL LOW (ref 135–145)
TCO2: 17 mmol/L — ABNORMAL LOW (ref 22–32)
pCO2, Ven: 30.5 mmHg — ABNORMAL LOW (ref 44–60)
pH, Ven: 7.327 (ref 7.25–7.43)
pO2, Ven: 167 mmHg — ABNORMAL HIGH (ref 32–45)

## 2023-10-22 LAB — CBC WITH DIFFERENTIAL/PLATELET
Abs Immature Granulocytes: 0.02 K/uL (ref 0.00–0.07)
Abs Immature Granulocytes: 0.04 K/uL (ref 0.00–0.07)
Basophils Absolute: 0 K/uL (ref 0.0–0.1)
Basophils Absolute: 0.1 K/uL (ref 0.0–0.1)
Basophils Relative: 1 %
Basophils Relative: 1 %
Eosinophils Absolute: 0.2 K/uL (ref 0.0–0.5)
Eosinophils Absolute: 0.3 K/uL (ref 0.0–0.5)
Eosinophils Relative: 3 %
Eosinophils Relative: 3 %
HCT: 38.7 % — ABNORMAL LOW (ref 39.0–52.0)
HCT: 39.3 % (ref 39.0–52.0)
Hemoglobin: 12.9 g/dL — ABNORMAL LOW (ref 13.0–17.0)
Hemoglobin: 12.8 g/dL — ABNORMAL LOW (ref 13.0–17.0)
Immature Granulocytes: 0 %
Immature Granulocytes: 1 %
Lymphocytes Relative: 29 %
Lymphocytes Relative: 36 %
Lymphs Abs: 1.7 K/uL (ref 0.7–4.0)
Lymphs Abs: 3.1 K/uL (ref 0.7–4.0)
MCH: 31.2 pg (ref 26.0–34.0)
MCH: 30.7 pg (ref 26.0–34.0)
MCHC: 33.3 g/dL (ref 30.0–36.0)
MCHC: 32.6 g/dL (ref 30.0–36.0)
MCV: 93.5 fL (ref 80.0–100.0)
MCV: 94.2 fL (ref 80.0–100.0)
Monocytes Absolute: 0.5 K/uL (ref 0.1–1.0)
Monocytes Absolute: 0.6 K/uL (ref 0.1–1.0)
Monocytes Relative: 9 %
Monocytes Relative: 7 %
Neutro Abs: 3.3 K/uL (ref 1.7–7.7)
Neutro Abs: 4.5 K/uL (ref 1.7–7.7)
Neutrophils Relative %: 58 %
Neutrophils Relative %: 52 %
Platelets: 302 K/uL (ref 150–400)
Platelets: 286 K/uL (ref 150–400)
RBC: 4.14 MIL/uL — ABNORMAL LOW (ref 4.22–5.81)
RBC: 4.17 MIL/uL — ABNORMAL LOW (ref 4.22–5.81)
RDW: 12.8 % (ref 11.5–15.5)
RDW: 12.6 % (ref 11.5–15.5)
WBC: 5.7 K/uL (ref 4.0–10.5)
WBC: 8.6 K/uL (ref 4.0–10.5)
nRBC: 0 % (ref 0.0–0.2)
nRBC: 0 % (ref 0.0–0.2)

## 2023-10-22 LAB — HEPATIC FUNCTION PANEL
ALT: 38 U/L (ref 0–44)
AST: 65 U/L — ABNORMAL HIGH (ref 15–41)
Albumin: 3.1 g/dL — ABNORMAL LOW (ref 3.5–5.0)
Alkaline Phosphatase: 122 U/L (ref 38–126)
Bilirubin, Direct: 0.1 mg/dL (ref 0.0–0.2)
Indirect Bilirubin: 0.6 mg/dL (ref 0.3–0.9)
Total Bilirubin: 0.7 mg/dL (ref 0.0–1.2)
Total Protein: 6.5 g/dL (ref 6.5–8.1)

## 2023-10-22 LAB — CBG MONITORING, ED
Glucose-Capillary: 320 mg/dL — ABNORMAL HIGH (ref 70–99)
Glucose-Capillary: 508 mg/dL (ref 70–99)
Glucose-Capillary: >600 mg/dL (ref 70–99)
Glucose-Capillary: >600 mg/dL (ref 70–99)

## 2023-10-22 LAB — RAPID URINE DRUG SCREEN, HOSP PERFORMED
Amphetamines: NOT DETECTED
Barbiturates: NOT DETECTED
Benzodiazepines: NOT DETECTED
Cocaine: NOT DETECTED
Opiates: NOT DETECTED
Tetrahydrocannabinol: NOT DETECTED

## 2023-10-22 LAB — ETHANOL: Alcohol, Ethyl (B): <15 mg/dL (ref <15)

## 2023-10-22 MED ORDER — LACTATED RINGERS IV SOLN: INTRAVENOUS | Status: DC

## 2023-10-22 MED ORDER — ONDANSETRON 4 MG PO TBDP: 4.0000 mg | ORAL_TABLET | Freq: Three times a day (TID) | ORAL | Status: DC | PRN

## 2023-10-22 MED ORDER — ENOXAPARIN SODIUM 40 MG/0.4ML IJ SOSY
40.0000 mg | PREFILLED_SYRINGE | INTRAMUSCULAR | Status: DC
  Administered 2023-10-22 – 2023-10-23 (×3): 40 mg via SUBCUTANEOUS
  Filled 2023-10-22 (×2): qty 0.4

## 2023-10-22 MED ORDER — GABAPENTIN 300 MG PO CAPS
300.0000 mg | ORAL_CAPSULE | Freq: Three times a day (TID) | ORAL | Status: DC
  Administered 2023-10-22 – 2023-10-24 (×6): 300 mg via ORAL
  Filled 2023-10-22 (×5): qty 1

## 2023-10-22 MED ORDER — INSULIN ASPART 100 UNIT/ML IJ SOLN: 0.0000 [IU] | INTRAMUSCULAR | Status: DC

## 2023-10-22 MED ORDER — INSULIN ASPART 100 UNIT/ML IJ SOLN: 0.0000 [IU] | Freq: Three times a day (TID) | INTRAMUSCULAR | Status: DC

## 2023-10-22 MED ORDER — DEXTROSE 50 % IV SOLN: 0.0000 mL | INTRAVENOUS | Status: DC | PRN

## 2023-10-22 MED ORDER — NICOTINE 7 MG/24HR TD PT24
7.0000 mg | MEDICATED_PATCH | Freq: Every day | TRANSDERMAL | Status: DC | PRN
  Administered 2023-10-22 (×2): 7 mg via TRANSDERMAL
  Filled 2023-10-22: qty 1

## 2023-10-22 MED ORDER — AMLODIPINE BESYLATE 10 MG PO TABS
10.0000 mg | ORAL_TABLET | Freq: Every day | ORAL | Status: DC
  Administered 2023-10-22 – 2023-10-24 (×4): 10 mg via ORAL
  Filled 2023-10-22: qty 1
  Filled 2023-10-22 (×2): qty 2

## 2023-10-22 MED ORDER — DEXTROSE IN LACTATED RINGERS 5 % IV SOLN: INTRAVENOUS | Status: DC

## 2023-10-22 MED ORDER — ACETAMINOPHEN 650 MG RE SUPP: 650.0000 mg | Freq: Four times a day (QID) | RECTAL | Status: DC | PRN

## 2023-10-22 MED ORDER — LACTATED RINGERS IV BOLUS: 20.0000 mL/kg | Freq: Once | INTRAVENOUS | Status: DC

## 2023-10-22 MED ORDER — INSULIN REGULAR(HUMAN) IN NACL 100-0.9 UT/100ML-% IV SOLN
INTRAVENOUS | Status: DC
  Administered 2023-10-22 (×2): 11.5 [IU]/h via INTRAVENOUS

## 2023-10-22 MED ORDER — POTASSIUM CHLORIDE 10 MEQ/100ML IV SOLN
10.0000 meq | INTRAVENOUS | Status: AC
  Administered 2023-10-22 (×2): 10 meq via INTRAVENOUS
  Filled 2023-10-22 (×2): qty 100

## 2023-10-22 MED ORDER — ACETAMINOPHEN 325 MG PO TABS
650.0000 mg | ORAL_TABLET | Freq: Four times a day (QID) | ORAL | Status: DC | PRN
  Administered 2023-10-22 (×2): 650 mg via ORAL
  Filled 2023-10-22: qty 2

## 2023-10-22 MED ORDER — LACTATED RINGERS IV BOLUS
1000.0000 mL | Freq: Once | INTRAVENOUS | Status: AC
  Administered 2023-10-22 (×2): 1000 mL via INTRAVENOUS

## 2023-10-22 MED ORDER — INSULIN GLARGINE-YFGN 100 UNIT/ML ~~LOC~~ SOLN
30.0000 [IU] | Freq: Every day | SUBCUTANEOUS | Status: DC
  Administered 2023-10-22 – 2023-10-23 (×3): 30 [IU] via SUBCUTANEOUS
  Filled 2023-10-22 (×4): qty 0.3

## 2023-10-22 MED ORDER — DEXTROSE 50 % IV SOLN
0.0000 mL | INTRAVENOUS | Status: DC | PRN
  Administered 2023-10-23: 30 mL via INTRAVENOUS
  Filled 2023-10-22: qty 50

## 2023-10-22 MED ORDER — POTASSIUM CHLORIDE 10 MEQ/100ML IV SOLN: 10.0000 meq | INTRAVENOUS | Status: DC

## 2023-10-22 MED ORDER — INSULIN REGULAR(HUMAN) IN NACL 100-0.9 UT/100ML-% IV SOLN: INTRAVENOUS | Status: DC

## 2023-10-22 MED ORDER — LACTATED RINGERS IV BOLUS
20.0000 mL/kg | Freq: Once | INTRAVENOUS | Status: AC
  Administered 2023-10-22 (×2): 1360 mL via INTRAVENOUS

## 2023-10-22 MED ORDER — ATORVASTATIN CALCIUM 40 MG PO TABS
40.0000 mg | ORAL_TABLET | Freq: Every day | ORAL | Status: DC
  Administered 2023-10-22 – 2023-10-24 (×4): 40 mg via ORAL
  Filled 2023-10-22 (×3): qty 1

## 2023-10-22 NOTE — Assessment & Plan Note (Addendum)
 HLD - continue home Lipitor 40mg  QD HTN - continue home amlodipine  10mg  QD Neuropathy - continue gabapentin  300mg  TID

## 2023-10-22 NOTE — Hospital Course (Addendum)
 David Wyatt is a 63 y.o.male with a history of HTN, T2DM, HLD and neuropathy who was admitted to the [facility] Medicine Teaching Service at [facility] for hyperglycemia and SI. His hospital course is detailed below:  T2DM: Patient arrived to the ED with CBG >600 with elevated anion gap. Patient was not in DKA, pH was normal and denying emesis or abdominal pain. Patient was complaining of hunger and polyuria. Patient was started on endotool and IVF with much improvement with his CBGs. Patient transitioned to carb modified diet and receiving basal insulin  30 units (home dose) and SSI with appropriate control of CBG. Patient medically stable for discharge to inpatient psychiatry with that insulin  regimen.  Suicidal Behavior: Patient says he has not been taking his home insulin  in an attempt to kill himself. Patient says he would like to go to the Behavioral Health unit to seek care. Psychiatry was consulted and recommended inpatient psychiatric hospitalization. Patient was placed on 1:1 monitoring. Patient here voluntarily. Psychiatry started medication regimen.  Other chronic conditions were medically managed with home medications and formulary alternatives as necessary (HLD, HTN, neuropathy)  PCP Follow-up Recommendations:  Close follow up for mood and insulin  compliance

## 2023-10-22 NOTE — Assessment & Plan Note (Addendum)
 Active SI with suicidal behavior- told us  he is not taking his insulin  to kill himself, and told RN he is eating sweets to kill himself. Patient states he would like to be admiutted to behavioral health. -psychiatry consulted -patient on 1:1 monitoring - will IVC if pt attempts to leave AMA

## 2023-10-22 NOTE — ED Provider Notes (Signed)
 [facility] AT [facility] Provider Note   CSN: [id number] Arrival date & time: 10/22/23  1721     Patient presents with: No chief complaint on file.   David Wyatt is a 63 y.o. male.   63 year old male presents for evaluation of hyperglycemia.  He was seen yesterday for the same.  He states he has tried to kill himself by taking too much insulin .  States he took it this morning.  Told triage staff that he was trying to kill himself by taking too many sweets, however the story seems to be conflicting.  Admits increased urinary frequency and thirst.  Denies any other symptoms or concerns at this time.        Prior to Admission medications   Medication Sig Start Date End Date Taking? Authorizing Provider  amLODipine  (NORVASC ) 10 MG tablet Take 1 tablet (10 mg total) by mouth daily. 10/01/23  Yes Lue Elsie BROCKS, MD  atorvastatin  (LIPITOR) 40 MG tablet Take 1 tablet (40 mg total) by mouth daily. 10/01/23  Yes Lue Elsie BROCKS, MD  gabapentin  (NEURONTIN ) 300 MG capsule Take 1 capsule (300 mg total) by mouth 3 (three) times daily. 10/01/23  Yes Lue Elsie BROCKS, MD  LANTUS  SOLOSTAR 100 UNIT/ML Solostar Pen Inject 30 Units into the skin at bedtime. 10/20/23  Yes Curatolo, Adam, DO  metFORMIN  (GLUCOPHAGE ) 500 MG tablet Take 1 tablet (500 mg total) by mouth 2 (two) times daily with a meal. 10/20/23  Yes Curatolo, Adam, DO  NOVOLOG  PENFILL cartridge Inject 10 Units into the skin 3 (three) times daily with meals. 07/29/23  Yes [provider]  Accu-Chek Softclix Lancets lancets Use to check blood sugar three times daily as directed 10/20/23   Ruthe Cornet, DO  Blood Glucose Monitoring Suppl (ACCU-CHEK GUIDE) w/Device KIT Use to test blood sugar 3 times daily 10/20/23   Curatolo, Adam, DO  glucose blood (ACCU-CHEK GUIDE TEST) test strip Use as instructed to check blood sugar three times daily 10/01/23   Lue Elsie BROCKS, MD    Allergies:  Lisinopril     Review of Systems  Constitutional:  Negative for chills and fever.  HENT:  Negative for ear pain and sore throat.   Eyes:  Negative for pain and visual disturbance.  Respiratory:  Negative for cough and shortness of breath.   Cardiovascular:  Negative for chest pain and palpitations.  Gastrointestinal:  Negative for abdominal pain and vomiting.  Endocrine: Positive for polydipsia and polyuria.  Genitourinary:  Negative for dysuria and hematuria.  Musculoskeletal:  Negative for arthralgias and back pain.  Skin:  Negative for color change and rash.  Neurological:  Negative for seizures and syncope.  Psychiatric/Behavioral:  Positive for suicidal ideas.   All other systems reviewed and are negative.   Updated Vital Signs BP (!) 140/86 (BP Location: Left Arm)   Pulse 90   Temp 98.8 F (37.1 C) (Oral)   Resp 20   Ht 5' 8 (1.727 m)   Wt 68 kg   SpO2 95%   BMI 22.79 kg/m   Physical Exam Vitals and nursing note reviewed.  Constitutional:      General: He is not in acute distress.    Appearance: Normal appearance. He is well-developed. He is not ill-appearing.  HENT:     Head: Normocephalic and atraumatic.  Eyes:     Conjunctiva/sclera: Conjunctivae normal.  Cardiovascular:     Rate and Rhythm: Normal rate and regular rhythm.     Heart sounds:  No murmur heard. Pulmonary:     Effort: Pulmonary effort is normal. No respiratory distress.     Breath sounds: Normal breath sounds. No stridor. No wheezing or rhonchi.  Abdominal:     Palpations: Abdomen is soft.     Tenderness: There is no abdominal tenderness.  Musculoskeletal:        General: No swelling.     Cervical back: Neck supple.  Skin:    General: Skin is warm and dry.     Capillary Refill: Capillary refill takes less than 2 seconds.  Neurological:     General: No focal deficit present.     Mental Status: He is alert.  Psychiatric:        Mood and Affect: Mood normal.     (all labs ordered are  listed, but only abnormal results are displayed) Labs Reviewed  BASIC METABOLIC PANEL WITH GFR - Abnormal; Notable for the following components:      Result Value   Sodium 128 (*)    Chloride 93 (*)    CO2 20 (*)    Glucose, Bld 867 (*)    Creatinine, Ser 1.28 (*)    All other components within normal limits  CBC WITH DIFFERENTIAL/PLATELET - Abnormal; Notable for the following components:   RBC 4.14 (*)    Hemoglobin 12.9 (*)    HCT 38.7 (*)    All other components within normal limits  URINALYSIS, ROUTINE W REFLEX MICROSCOPIC - Abnormal; Notable for the following components:   Color, Urine COLORLESS (*)    Glucose, UA >=500 (*)    All other components within normal limits  HEPATIC FUNCTION PANEL - Abnormal; Notable for the following components:   Albumin 3.1 (*)    AST 65 (*)    All other components within normal limits  URINALYSIS, ROUTINE W REFLEX MICROSCOPIC - Abnormal; Notable for the following components:   Color, Urine COLORLESS (*)    Glucose, UA >=500 (*)    All other components within normal limits  CBG MONITORING, ED - Abnormal; Notable for the following components:   Glucose-Capillary >600 (*)    All other components within normal limits  I-STAT VENOUS BLOOD GAS, ED - Abnormal; Notable for the following components:   pCO2, Ven 30.5 (*)    pO2, Ven 167 (*)    Bicarbonate 15.9 (*)    TCO2 17 (*)    Acid-base deficit 9.0 (*)    Sodium 128 (*)    Calcium , Ion 1.06 (*)    All other components within normal limits  CBG MONITORING, ED - Abnormal; Notable for the following components:   Glucose-Capillary >600 (*)    All other components within normal limits  ETHANOL  RAPID URINE DRUG SCREEN, HOSP PERFORMED  CBC WITH DIFFERENTIAL/PLATELET  BETA-HYDROXYBUTYRIC ACID  BASIC METABOLIC PANEL WITH GFR  BASIC METABOLIC PANEL WITH GFR  BASIC METABOLIC PANEL WITH GFR  BASIC METABOLIC PANEL WITH GFR    EKG: EKG Interpretation Date/Time:  Wednesday October 22 2023  17:29:26 EDT Ventricular Rate:  88 PR Interval:  155 QRS Duration:  86 QT Interval:  372 QTC Calculation: 451 R Axis:   -3  Text Interpretation: Sinus rhythm Atrial premature complex Left atrial enlargement Abnormal R-wave progression, early transition Comapred with EKG from 10/19/2023 Confirmed by Gennaro Bouchard ([id number]) on 10/22/2023 5:58:56 PM  Radiology: No results found.   Procedures   Medications Ordered in the ED  lactated ringers  bolus 1,360 mL (has no administration in time range)  lactated  ringers  infusion (has no administration in time range)  atorvastatin  (LIPITOR) tablet 40 mg (40 mg Oral Given 10/22/23 2111)  amLODipine  (NORVASC ) tablet 10 mg (10 mg Oral Given 10/22/23 2111)  gabapentin  (NEURONTIN ) capsule 300 mg (300 mg Oral Given 10/22/23 2111)  enoxaparin  (LOVENOX ) injection 40 mg (40 mg Subcutaneous Given 10/22/23 2112)  acetaminophen  (TYLENOL ) tablet 650 mg (has no administration in time range)    Or  acetaminophen  (TYLENOL ) suppository 650 mg (has no administration in time range)  ondansetron  (ZOFRAN -ODT) disintegrating tablet 4 mg (has no administration in time range)  nicotine  (NICODERM CQ  - dosed in mg/24 hr) patch 7 mg (has no administration in time range)  insulin  glargine-yfgn (SEMGLEE ) injection 30 Units (30 Units Subcutaneous Given 10/22/23 2128)  lactated ringers  bolus 1,360 mL (has no administration in time range)  insulin  regular, human (MYXREDLIN ) 100 units/ 100 mL infusion (has no administration in time range)  lactated ringers  infusion (has no administration in time range)  dextrose  5 % in lactated ringers  infusion (has no administration in time range)  dextrose  50 % solution 0-50 mL (has no administration in time range)  potassium chloride  10 mEq in 100 mL IVPB (has no administration in time range)  lactated ringers  bolus 1,000 mL (0 mLs Intravenous Stopped 10/22/23 1927)                                    Medical Decision Making Cardiac monitor  interpretation: Sinus rhythm, no ectopy  Social determinants of health: Noncompliance with medications, psychiatric history  Patient here with mild DKA and noncompliance with his medications.  Started on IV fluids as well as insulin  drip.  Initially states he was suicidal but that when he found that he was going to be admitted he no longer was suicidal, have a feeling there is some malingering aspect to the suicidal complaints as he states I will kill myself if you help me.  Potassium was placed in patient's fluids and I discussed the patient's case with family medicine resident for admitting service and patient to be admitted for further workup and management.  He is agreeable to plan.  Problems Addressed: Diabetic ketoacidosis without coma associated with type 2 diabetes mellitus (HCC): acute illness or injury that poses a threat to life or bodily functions H/O medication noncompliance: chronic illness or injury that poses a threat to life or bodily functions Suicidal ideations: undiagnosed new problem with uncertain prognosis  Amount and/or Complexity of Data Reviewed External Data Reviewed: notes.    Details: Previous ER records reviewed and patient was seen 2 days ago in the ER for similar symptoms, but no evidence of DKA at that time Labs: ordered. Decision-making details documented in ED Course.    Details: Ordered and reviewed by me and patient with evidence of mild DKA, normal potassium, hyponatremia and anion gap of 15 ECG/medicine tests: ordered and independent interpretation performed. Decision-making details documented in ED Course.    Details: Ordered and interpreted by me in the absence of cardiology and shows sinus rhythm, no STEMI no acute change when compared to prior Discussion of management or test interpretation with external provider(s): Family medicine resident-I spoke with him regarding patient's case and they will with the patient for further workup and  management  Risk OTC drugs. Prescription drug management. Drug therapy requiring intensive monitoring for toxicity. Decision regarding hospitalization. Diagnosis or treatment significantly limited by social determinants of health. Risk  Details: CRITICAL CARE Performed by: Duwaine LITTIE Fusi   Total critical care time: 30 minutes  Critical care time was exclusive of separately billable procedures and treating other patients.  Critical care was necessary to treat or prevent imminent or life-threatening deterioration.  Critical care was time spent personally by me on the following activities: development of treatment plan with patient and/or surrogate as well as nursing, discussions with consultants, evaluation of patient's response to treatment, examination of patient, obtaining history from patient or surrogate, ordering and performing treatments and interventions, ordering and review of laboratory studies, ordering and review of radiographic studies, pulse oximetry and re-evaluation of patient's condition.   Critical Care Total time providing critical care: 30 minutes     Final diagnoses:  Diabetic ketoacidosis without coma associated with type 2 diabetes mellitus (HCC)  H/O medication noncompliance  Suicidal ideations    ED Discharge Orders     None          Fusi Duwaine LITTIE, DO 10/22/23 2143

## 2023-10-22 NOTE — ED Triage Notes (Signed)
 Patient arrives via BHRT, CBG reading high. Endorses taking novolog  and his long acting insulin . Patient endorses eating lots of sweets with the intention of committing suicide. Patient seeking behavioral health services inpatient. Patient is here voluntary.

## 2023-10-22 NOTE — Assessment & Plan Note (Addendum)
 Last HbA1c on 09/27/23 was 13.9 and CBG on admission was >600.  Home medications include Metformin  500 twice daily, Lantus  30 units at bedtime, NovoLog  10 units 3 times daily with meals.  Due to being unable to tolerate endotool 2/2 NPO status, will start with LAI admin with endotool for sliding scale correction with carb modified diet.  - Start Lantus  30u at bedtime - endotool protocol - Admit to FMTS, attending Dr. Madelon - progressive, Vital signs per floor - carb modified diet  - VTE prophylaxis lovenox  - BMP q4 - Fall precautions - goal K+ >4

## 2023-10-22 NOTE — H&P (Addendum)
 Hospital Admission History and Physical Service Pager: [phone]  Patient name: David Wyatt Medical record number: [id number] Date of Birth: 10/23/1960 Age: 63 y.o. Gender: male  Primary Care Provider: Delbert Clam, MD Consultants: psychiatry Code Status: FULL Preferred Emergency Contact: Extended Emergency Contact Information Primary Emergency Contact: David Wyatt, [place] Home Phone: [phone] Mobile Phone: [phone] Relation: Sister   Chief Complaint: hungry  Assessment and Plan: David Wyatt is a 63 y.o. male presenting with hyperglycemia. Differential for presentation of this includes uncontrolled type 2 DM, DKA, infection.   Mild DKA, patient denies vomiting and VBG pH WNL and clinically feels well although anion gap is elevated. Suspicion for infection is low, UA did not show UTI, patient is afebrile, alert and oriented. Patient says he has not been taking his insulin  in an attempt to kill himself, however he is highly motivated to seek help with behavioral health. Assessment & Plan Type 2 diabetes mellitus with hyperglycemia (HCC) Last HbA1c on 09/27/23 was 13.9 and CBG on admission was >600.  Home medications include Metformin  500 twice daily, Lantus  30 units at bedtime, NovoLog  10 units 3 times daily with meals.  Due to being unable to tolerate endotool 2/2 NPO status, will start with LAI admin with endotool for sliding scale correction with carb modified diet.  - Start Lantus  30u at bedtime - endotool protocol - Admit to FMTS, attending Dr. Rumball - progressive, Vital signs per floor - carb modified diet  - VTE prophylaxis lovenox  - BMP q4 - Fall precautions - goal K+ >4 Suicidal behavior Active SI with suicidal behavior- told us  he is not taking his insulin  to kill himself, and told RN he is eating sweets to kill himself. Patient states he would like to be admiutted to behavioral health. -psychiatry  consulted -patient on 1:1 monitoring - will IVC if pt attempts to leave AMA Chronic health problem HLD - continue home Lipitor 40mg  QD HTN - continue home amlodipine  10mg  QD Neuropathy - continue gabapentin  300mg  TID  FEN/GI: carb modified VTE Prophylaxis: lovenox   Disposition: [facility]  History of Present Illness:  David Wyatt is a 63 y.o. male presenting with hyperglycemia. Patient says he has not been taking his insulin  because he wants to end his life. Patient says he has been homeless for the past 3 weeks and this has been difficult for him.  Patient says he is on a medication for depression but does not know what he takes or when he last took this medication.  In the ED, patients initial glucose was >600. He was started on insulin  drip and fluids and repeat CBG was 422. Patient's chief complaint in ED was hunger and he was given a sandwich and saltine crackers.  Review Of Systems: Per HPI with the following additions: endorses polyuria, denied emesis or nausea  Pertinent Past Medical History: HLD HTN Neuropathy T2DM Remainder reviewed in history tab.   Pertinent Past Surgical History: Inguinal hernia repair Remainder reviewed in history tab.  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   Important Outpatient Medications: Amlodipine  10 mg daily Lipitor 40 mg daily Gabapentin  300 3 times daily Metformin  500 twice daily Lantus  30 units at bedtime NovoLog  10 units 3 times daily with meals Remainder reviewed in  medication history.   Objective: BP (!) 140/86 (BP Location: Left Arm)   Pulse 90   Temp 98.8 F (37.1 C) (Oral)   Resp 20   Ht 5' 8 (1.727 m)   Wt 68 kg   SpO2 95%   BMI 22.79 kg/m  Exam: General: laying in bed, NAD, disheveled  Eyes: EOMI ENTM: dry mucous  membranes Neck: full ROM Cardiovascular: RRR, no r/m/g Respiratory: CTAB, normal work of breathing on RA Gastrointestinal: soft, nontender Neuro: a&ox4 Psych: active SI  Labs:  CBC BMET  Recent Labs  Lab 10/22/23 1759 10/22/23 1813  WBC 5.7  --   HGB 12.9* 13.6  HCT 38.7* 40.0  PLT 302  --    Recent Labs  Lab 10/22/23 1759 10/22/23 1813  NA 128* 128*  K 4.2 4.1  CL 93*  --   CO2 20*  --   BUN 21  --   CREATININE 1.28*  --   GLUCOSE 867*  --   CALCIUM  9.1  --      EKG: My own interpretation: normal sinus rhythm   Idelle Nakai, DO 10/22/2023, 7:11 PM PGY-1, [facility]  FPTS Intern pager: [phone], text pages welcome Secure chat group [facility] Teaching Service   I have reviewed the above note and made the appropriate changes.   Damien Pinal, DO [facility], PGY-3 10/22/23 8:35 PM

## 2023-10-22 NOTE — ED Notes (Signed)
 Patient belongings placed in locker 5

## 2023-10-23 DIAGNOSIS — E1165 Type 2 diabetes mellitus with hyperglycemia: Principal | ICD-10-CM

## 2023-10-23 LAB — CBG MONITORING, ED
Glucose-Capillary: 106 mg/dL — ABNORMAL HIGH (ref 70–99)
Glucose-Capillary: 131 mg/dL — ABNORMAL HIGH (ref 70–99)
Glucose-Capillary: 151 mg/dL — ABNORMAL HIGH (ref 70–99)
Glucose-Capillary: 171 mg/dL — ABNORMAL HIGH (ref 70–99)
Glucose-Capillary: 181 mg/dL — ABNORMAL HIGH (ref 70–99)
Glucose-Capillary: 216 mg/dL — ABNORMAL HIGH (ref 70–99)
Glucose-Capillary: 264 mg/dL — ABNORMAL HIGH (ref 70–99)
Glucose-Capillary: 338 mg/dL — ABNORMAL HIGH (ref 70–99)
Glucose-Capillary: 66 mg/dL — ABNORMAL LOW (ref 70–99)
Glucose-Capillary: 71 mg/dL (ref 70–99)

## 2023-10-23 LAB — BASIC METABOLIC PANEL WITH GFR
Anion gap: 11 (ref 5–15)
Anion gap: 10 (ref 5–15)
Anion gap: 9 (ref 5–15)
Anion gap: 6 (ref 5–15)
BUN: 22 mg/dL (ref 8–23)
BUN: 19 mg/dL (ref 8–23)
BUN: 16 mg/dL (ref 8–23)
BUN: 15 mg/dL (ref 8–23)
CO2: 23 mmol/L (ref 22–32)
CO2: 25 mmol/L (ref 22–32)
CO2: 26 mmol/L (ref 22–32)
CO2: 25 mmol/L (ref 22–32)
Calcium: 9.6 mg/dL (ref 8.9–10.3)
Calcium: 9.2 mg/dL (ref 8.9–10.3)
Calcium: 8.7 mg/dL — ABNORMAL LOW (ref 8.9–10.3)
Calcium: 8.7 mg/dL — ABNORMAL LOW (ref 8.9–10.3)
Chloride: 103 mmol/L (ref 98–111)
Chloride: 105 mmol/L (ref 98–111)
Chloride: 103 mmol/L (ref 98–111)
Chloride: 103 mmol/L (ref 98–111)
Creatinine, Ser: 1.12 mg/dL (ref 0.61–1.24)
Creatinine, Ser: 0.82 mg/dL (ref 0.61–1.24)
Creatinine, Ser: 0.95 mg/dL (ref 0.61–1.24)
Creatinine, Ser: 0.91 mg/dL (ref 0.61–1.24)
GFR, Estimated: >60 mL/min (ref >60)
GFR, Estimated: >60 mL/min (ref >60)
GFR, Estimated: >60 mL/min (ref >60)
GFR, Estimated: >60 mL/min (ref >60)
Glucose, Bld: 285 mg/dL — ABNORMAL HIGH (ref 70–99)
Glucose, Bld: 77 mg/dL (ref 70–99)
Glucose, Bld: 159 mg/dL — ABNORMAL HIGH (ref 70–99)
Glucose, Bld: 274 mg/dL — ABNORMAL HIGH (ref 70–99)
Potassium: 3.7 mmol/L (ref 3.5–5.1)
Potassium: 3.5 mmol/L (ref 3.5–5.1)
Potassium: 4.4 mmol/L (ref 3.5–5.1)
Potassium: 5.2 mmol/L — ABNORMAL HIGH (ref 3.5–5.1)
Sodium: 137 mmol/L (ref 135–145)
Sodium: 140 mmol/L (ref 135–145)
Sodium: 138 mmol/L (ref 135–145)
Sodium: 134 mmol/L — ABNORMAL LOW (ref 135–145)

## 2023-10-23 LAB — BETA-HYDROXYBUTYRIC ACID: Beta-Hydroxybutyric Acid: 0.17 mmol/L (ref 0.05–0.27)

## 2023-10-23 LAB — LIPID PANEL
Cholesterol: 197 mg/dL (ref 0–200)
HDL: 62 mg/dL (ref >40)
LDL Cholesterol: 112 mg/dL — ABNORMAL HIGH (ref 0–99)
Total CHOL/HDL Ratio: 3.2 ratio
Triglycerides: 113 mg/dL (ref <150)
VLDL: 23 mg/dL (ref 0–40)

## 2023-10-23 LAB — PHOSPHORUS: Phosphorus: 3.5 mg/dL (ref 2.5–4.6)

## 2023-10-23 LAB — SARS CORONAVIRUS 2 BY RT PCR: SARS Coronavirus 2 by RT PCR: NEGATIVE

## 2023-10-23 LAB — GLUCOSE, CAPILLARY: Glucose-Capillary: 260 mg/dL — ABNORMAL HIGH (ref 70–99)

## 2023-10-23 LAB — MAGNESIUM: Magnesium: 1.7 mg/dL (ref 1.7–2.4)

## 2023-10-23 LAB — TSH: TSH: 0.509 u[IU]/mL (ref 0.350–4.500)

## 2023-10-23 MED ORDER — HYDROXYZINE HCL 10 MG PO TABS
10.0000 mg | ORAL_TABLET | Freq: Three times a day (TID) | ORAL | Status: DC | PRN
  Administered 2023-10-23: 10 mg via ORAL
  Filled 2023-10-23: qty 1

## 2023-10-23 MED ORDER — DOXEPIN HCL 10 MG PO CAPS
10.0000 mg | ORAL_CAPSULE | Freq: Every evening | ORAL | Status: DC | PRN
  Administered 2023-10-23: 10 mg via ORAL
  Filled 2023-10-23 (×2): qty 1

## 2023-10-23 MED ORDER — POTASSIUM CHLORIDE 10 MEQ/100ML IV SOLN
10.0000 meq | INTRAVENOUS | Status: AC
  Administered 2023-10-23 (×2): 10 meq via INTRAVENOUS
  Filled 2023-10-23: qty 100

## 2023-10-23 MED ORDER — INSULIN ASPART 100 UNIT/ML IJ SOLN: 0.0000 [IU] | Freq: Three times a day (TID) | INTRAMUSCULAR | Status: DC

## 2023-10-23 MED ORDER — ONDANSETRON 4 MG PO TBDP: 4.0000 mg | ORAL_TABLET | Freq: Three times a day (TID) | ORAL | Status: DC | PRN

## 2023-10-23 MED ORDER — INSULIN ASPART 100 UNIT/ML IJ SOLN
0.0000 [IU] | Freq: Three times a day (TID) | INTRAMUSCULAR | Status: DC
  Administered 2023-10-23: 2 [IU] via SUBCUTANEOUS
  Administered 2023-10-23: 7 [IU] via SUBCUTANEOUS
  Administered 2023-10-23: 5 [IU] via SUBCUTANEOUS
  Administered 2023-10-24: 3 [IU] via SUBCUTANEOUS
  Administered 2023-10-24: 5 [IU] via SUBCUTANEOUS

## 2023-10-23 MED ORDER — HYDROXYZINE HCL 10 MG PO TABS: 10.0000 mg | ORAL_TABLET | Freq: Three times a day (TID) | ORAL | Status: DC | PRN

## 2023-10-23 MED ORDER — POTASSIUM CHLORIDE 10 MEQ/100ML IV SOLN
10.0000 meq | INTRAVENOUS | Status: AC
  Administered 2023-10-23 (×4): 10 meq via INTRAVENOUS
  Filled 2023-10-23 (×3): qty 100

## 2023-10-23 MED ORDER — DOXEPIN HCL 10 MG PO CAPS: 10.0000 mg | ORAL_CAPSULE | Freq: Every evening | ORAL | Status: DC | PRN

## 2023-10-23 MED ORDER — MELATONIN 3 MG PO TABS
3.0000 mg | ORAL_TABLET | Freq: Every day | ORAL | Status: DC
  Administered 2023-10-23: 3 mg via ORAL
  Filled 2023-10-23: qty 1

## 2023-10-23 MED ORDER — INSULIN GLARGINE-YFGN 100 UNIT/ML ~~LOC~~ SOLN: 30.0000 [IU] | Freq: Every day | SUBCUTANEOUS | Status: DC

## 2023-10-23 MED ORDER — NICOTINE 7 MG/24HR TD PT24: 7.0000 mg | MEDICATED_PATCH | Freq: Every day | TRANSDERMAL | Status: DC | PRN

## 2023-10-23 MED ORDER — POTASSIUM CHLORIDE 10 MEQ/100ML IV SOLN
10.0000 meq | INTRAVENOUS | Status: DC
  Filled 2023-10-23: qty 100

## 2023-10-23 MED ORDER — DULOXETINE HCL 60 MG PO CPEP
60.0000 mg | ORAL_CAPSULE | Freq: Every day | ORAL | Status: DC
  Administered 2023-10-23 – 2023-10-24 (×2): 60 mg via ORAL
  Filled 2023-10-23: qty 2
  Filled 2023-10-23: qty 1

## 2023-10-23 MED ORDER — DULOXETINE HCL 60 MG PO CPEP: 60.0000 mg | ORAL_CAPSULE | Freq: Every day | ORAL | Status: DC

## 2023-10-23 NOTE — Assessment & Plan Note (Addendum)
 Reported he discontinued insulin  at home and was eating lots of sweets to intentionally die.  - psych consulted yesterday, should see today - will IVC if patients tries to leave AMA

## 2023-10-23 NOTE — ED Notes (Signed)
 Breakfast tray delivered

## 2023-10-23 NOTE — Assessment & Plan Note (Signed)
 HLD - continue home Lipitor 40mg  QD HTN - continue home amlodipine  10mg  QD Neuropathy - continue gabapentin  300mg  TID

## 2023-10-23 NOTE — Assessment & Plan Note (Addendum)
 Patient now off IV insulin  with stable glucose.  - Semglee  30 units at bedtime - added sensitive SSI - Medically stable for discharge at this time.

## 2023-10-23 NOTE — Discharge Summary (Addendum)
 Family Medicine Teaching [facility] Discharge Summary  Patient name: David Wyatt Medical record number: [id number] Date of birth: Mar 01, 1961 Age: 63 y.o. Gender: male Date of Admission: 10/22/2023  Date of Discharge: 10/23/23 Admitting Physician: Donald CHRISTELLA Lai, DO  Primary Care Provider: Delbert Clam, MD Consultants: psychiatry  Indication for Hospitalization: hyperosmolar hyperglycemic state   Discharge Diagnoses/Problem List:  Principal Problem:   Hyperglycemia due to diabetes mellitus [facility]) Active Problems:   Type 2 diabetes mellitus with hyperglycemia (HCC)   Suicidal behavior   MDD (major depressive disorder), recurrent episode, severe (HCC)   Chronic health problem  The above problem list has been updated and reviewed for accuracy, including the initial reason for admission.  Brief Hospital Course:  [name] is a 63 y.o.male with a history of HTN, T2DM, HLD and neuropathy who was admitted to the [facility] Medicine Teaching Service at [facility] for hyperglycemia and SI. His hospital course is detailed below:  T2DM: Patient arrived to the ED with CBG >600 with elevated anion gap. Patient was not in DKA, pH was normal and denying emesis or abdominal pain. Patient was complaining of hunger and polyuria. Patient was started on endotool and IVF with much improvement with his CBGs. Patient transitioned to carb modified diet and receiving basal insulin  30 units (home dose) and SSI with appropriate control of CBG. Patient medically stable for discharge to inpatient psychiatry with that insulin  regimen.  Suicidal Behavior: Patient says he has not been taking his home insulin  in an attempt to kill himself. Patient says he would like to go to the Behavioral Health unit to seek care. Psychiatry was consulted and recommended inpatient psychiatric hospitalization. Patient was placed on 1:1 monitoring. Patient here voluntarily. Psychiatry started medication regimen.  Other chronic  conditions were medically managed with home medications and formulary alternatives as necessary (HLD, HTN, neuropathy)  PCP Follow-up Recommendations:  Close follow up for mood and insulin  compliance  Results/Tests Pending at Time of Discharge:  Unresulted Labs (From admission, onward)     Start     Ordered   10/23/23 1051  Lipid panel  Add-on,   AD        10/23/23 1050   10/23/23 1051  TSH  Add-on,   AD        10/23/23 1050   10/23/23 1030  SARS Coronavirus 2 by RT PCR (hospital order, performed in [facility] hospital lab) *cepheid single result test* Anterior Nasal Swab  (Tier 2 - SARS Coronavirus 2 by RT PCR (hospital order, performed in [facility] hospital lab) *cepheid single result test*)  Once,   R        10/23/23 1029   10/22/23 1953  Basic metabolic panel  Now then every 4 hours,   R (with TIMED occurrences)      10/22/23 1959             Disposition: inpatient psychiatry  Discharge Condition: medically stable  Discharge Exam:  Vitals:   10/23/23 1000 10/23/23 1015  BP: (!) 151/89   Pulse: 67 62  Resp: 14 11  Temp:    SpO2: 100% 98%   General: lying in bed with blanket over head Cardiovascular: RRR Respiratory: CTAB Abdomen: soft nontender Psych: speech clear, rate normal, thoughts goal oriented   Significant Procedures: none  Significant Labs and Imaging:  Recent Labs  Lab 10/21/23 1734 10/21/23 1744 10/22/23 1759 10/22/23 1813 10/22/23 2316  WBC 6.7  --  5.7  --  8.6  HGB 13.3   < >  12.9* 13.6 12.8*  HCT 40.1   < > 38.7* 40.0 39.3  PLT 293  --  302  --  286   < > = values in this interval not displayed.   Recent Labs  Lab 10/21/23 1958 10/22/23 1759 10/22/23 1813 10/22/23 2316 10/23/23 0348 10/23/23 0753  NA 133* 128* 128* 137 140 138  K 4.1 4.2 4.1 3.7 3.5 4.4  CL 100 93*  --  103 105 103  CO2 21* 20*  --  23 25 26   GLUCOSE 501* 867*  --  285* 77 159*  BUN 27* 21  --  22 19 16   CREATININE 1.24 1.28*  --  1.12 0.82 0.95  CALCIUM   9.1 9.1  --  9.6 9.2 8.7*  MG  --   --   --   --   --  1.7  PHOS  --   --   --   --   --  3.5  ALKPHOS  --  122  --   --   --   --   AST  --  65*  --   --   --   --   ALT  --  38  --   --   --   --   ALBUMIN  --  3.1*  --   --   --   --    Discharge Medications:  Allergies as of 10/23/2023       Reactions   Lisinopril  Other (See Comments), Swelling   Lip Swelling, angioedema angioedema  Lip Swelling  Lip Swelling, angioedema        Medication List     STOP taking these medications    Lantus  SoloStar 100 UNIT/ML Solostar Pen Generic drug: insulin  glargine   NovoLOG  PenFill cartridge Generic drug: insulin  aspart Replaced by: insulin  aspart 100 UNIT/ML injection       TAKE these medications    Accu-Chek Guide Test test strip Generic drug: glucose blood Use as instructed to check blood sugar three times daily   Accu-Chek Guide w/Device Kit Use to test blood sugar 3 times daily   Accu-Chek Softclix Lancets lancets Use to check blood sugar three times daily as directed   amLODipine  10 MG tablet Commonly known as: NORVASC  Take 1 tablet (10 mg total) by mouth daily.   atorvastatin  40 MG tablet Commonly known as: LIPITOR Take 1 tablet (40 mg total) by mouth daily.   doxepin  10 MG capsule Commonly known as: SINEQUAN  Take 1 capsule (10 mg total) by mouth at bedtime as needed (Sleep).   DULoxetine  60 MG capsule Commonly known as: CYMBALTA  Take 1 capsule (60 mg total) by mouth daily. Start taking on: October 24, 2023   gabapentin  300 MG capsule Commonly known as: NEURONTIN  Take 1 capsule (300 mg total) by mouth 3 (three) times daily.   hydrOXYzine  10 MG tablet Commonly known as: ATARAX  Take 1 tablet (10 mg total) by mouth 3 (three) times daily as needed for anxiety.   insulin  aspart 100 UNIT/ML injection Commonly known as: novoLOG  Inject 0-9 Units into the skin 3 (three) times daily with meals. Replaces: NovoLOG  PenFill cartridge   insulin  glargine-yfgn  100 UNIT/ML injection Commonly known as: SEMGLEE  Inject 0.3 mLs (30 Units total) into the skin at bedtime.   metFORMIN  500 MG tablet Commonly known as: GLUCOPHAGE  Take 1 tablet (500 mg total) by mouth 2 (two) times daily with a meal.   nicotine  7 mg/24hr patch Commonly known as: NICODERM  CQ - dosed in mg/24 hr Place 1 patch (7 mg total) onto the skin daily as needed (nicotine  withdrawal).   ondansetron  4 MG disintegrating tablet Commonly known as: ZOFRAN -ODT Take 1 tablet (4 mg total) by mouth every 8 (eight) hours as needed for nausea or vomiting.        Discharge Instructions: Please refer to Patient Instructions section of EMR for full details.  Patient was counseled important signs and symptoms that should prompt return to medical care, changes in medications, dietary instructions, activity restrictions, and follow up appointments.   Follow-Up Appointments: d/c to inpatient psychiatry  Alena Morrison, Elio, MD 10/23/2023, 12:08 PM PGY-1, [facility]  Upper Level Addendum: I have seen and evaluated this patient along with Dr. Alena Morrison and reviewed the above note, making necessary revisions as appropriate. I agree with the medical decision making and physical exam as noted above. Gladis Church, DO PGY-3 [facility]
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
## 2023-10-23 NOTE — ED Notes (Signed)
 Pt to floor with NT. All belongings sent with patient.

## 2023-10-23 NOTE — ED Notes (Signed)
 Lunch tray delivered.

## 2023-10-23 NOTE — Plan of Care (Signed)
 Received notification that patient does not have bed at inpatient psychiatric hospital.  Discharge has been canceled.  Patient is medically stable for discharge, family medicine will continue to care for patient until discharge.

## 2023-10-23 NOTE — ED Notes (Signed)
 Dinner tray delivered.

## 2023-10-23 NOTE — ED Notes (Addendum)
 At 0223 pt asked this RN if he had left when his blood sugar was 66 what would have happened. Pt is raising his voice, stating I ain't getting the care I'm suppose to. Verbal reassurance provided to pt however pt continued to raise his voice demanding to have a sheet put over his head. Pt stating he is just going to leave since he isn't getting proper care. ED charge made aware. Provider sent message on secure chat.

## 2023-10-23 NOTE — Progress Notes (Signed)
     Daily Progress Note Intern Pager: [phone]  Patient name: David Wyatt Medical record number: [id number] Date of birth: [birth year] Age: 63 y.o. Gender: male  Primary Care Provider: Delbert Clam, MD Consultants: psych Code Status: full  Pt Overview and Major Events to Date:  8/13 - admitted  Assessment and Plan:  63 yo male presenting with HHS, now resolved hyperglycemia, and SI. Pertinent PMH/PSH includes T2DM, HLD, HTN.  Assessment & Plan Type 2 diabetes mellitus with hyperglycemia (HCC) Patient now off IV insulin  with stable glucose.  - Semglee  30 units at bedtime - added sensitive SSI - Medically stable for discharge at this time.  Suicidal behavior Reported he discontinued insulin  at home and was eating lots of sweets to intentionally die.  - psych consulted yesterday, should see today - will IVC if patients tries to leave AMA Chronic health problem HLD - continue home Lipitor 40mg  QD HTN - continue home amlodipine  10mg  QD Neuropathy - continue gabapentin  300mg  TID  FEN/GI: carb modified PPx: lovenox  Dispo:Inpatient psych pending clinical improvement .   Subjective:  Patient's only complaint is occasional leg pain, but not currently. No SOB, CP, vision changes. Last BM yesterday.  Objective: Temp:  [98.2 F (36.8 C)-98.8 F (37.1 C)] 98.2 F (36.8 C) (08/14 0600) Pulse Rate:  [65-91] 65 (08/14 0600) Resp:  [11-20] 16 (08/14 0700) BP: (117-162)/(70-102) 138/102 (08/14 0700) SpO2:  [95 %-99 %] 97 % (08/14 0600) Weight:  [68 kg] 68 kg (08/13 1720) Physical Exam: General: lying in bed with blanket over head Cardiovascular: RRR Respiratory: CTAB Abdomen: soft nontender  Laboratory: Most recent CBC Lab Results  Component Value Date   WBC 8.6 10/22/2023   HGB 12.8 (L) 10/22/2023   HCT 39.3 10/22/2023   MCV 94.2 10/22/2023   PLT 286 10/22/2023   Most recent BMP    Latest Ref Rng & Units 10/23/2023    3:48 AM  BMP  Glucose 70 - 99 mg/dL 77    BUN 8 - 23 mg/dL 19   Creatinine 9.38 - 1.24 mg/dL 9.17   Sodium 864 - 854 mmol/L 140   Potassium 3.5 - 5.1 mmol/L 3.5   Chloride 98 - 111 mmol/L 105   CO2 22 - 32 mmol/L 25   Calcium  8.9 - 10.3 mg/dL 9.2     Imaging/Diagnostic Tests: None  Alena Morrison, Elio, MD 10/23/2023, 7:17 AM  PGY-1, [facility] FPTS Intern pager: [phone], text pages welcome Secure chat group [facility] Teaching Service

## 2023-10-24 ENCOUNTER — Inpatient Hospital Stay
Admission: AD | Admit: 2023-10-24 | Discharge: 2023-11-01 | DRG: 885 | Disposition: A | Discharge status: Home / Self Care | Payer: MEDICAID | Source: Intra-hospital

## 2023-10-24 ENCOUNTER — Other Ambulatory Visit: Payer: Self-pay

## 2023-10-24 DIAGNOSIS — F17210 Nicotine dependence, cigarettes, uncomplicated: Secondary | ICD-10-CM | POA: Diagnosis present

## 2023-10-24 DIAGNOSIS — Z716 Tobacco abuse counseling: Secondary | ICD-10-CM | POA: Diagnosis not present

## 2023-10-24 DIAGNOSIS — Z5901 Sheltered homelessness: Secondary | ICD-10-CM | POA: Diagnosis not present

## 2023-10-24 DIAGNOSIS — M549 Dorsalgia, unspecified: Secondary | ICD-10-CM | POA: Diagnosis present

## 2023-10-24 DIAGNOSIS — F332 Major depressive disorder, recurrent severe without psychotic features: Secondary | ICD-10-CM | POA: Diagnosis present

## 2023-10-24 DIAGNOSIS — M19041 Primary osteoarthritis, right hand: Secondary | ICD-10-CM | POA: Diagnosis present

## 2023-10-24 DIAGNOSIS — Z833 Family history of diabetes mellitus: Secondary | ICD-10-CM

## 2023-10-24 DIAGNOSIS — E1140 Type 2 diabetes mellitus with diabetic neuropathy, unspecified: Secondary | ICD-10-CM | POA: Diagnosis present

## 2023-10-24 DIAGNOSIS — Z794 Long term (current) use of insulin: Secondary | ICD-10-CM

## 2023-10-24 DIAGNOSIS — M47812 Spondylosis without myelopathy or radiculopathy, cervical region: Secondary | ICD-10-CM | POA: Diagnosis present

## 2023-10-24 DIAGNOSIS — Z888 Allergy status to other drugs, medicaments and biological substances status: Secondary | ICD-10-CM

## 2023-10-24 DIAGNOSIS — Z5982 Transportation insecurity: Secondary | ICD-10-CM | POA: Diagnosis not present

## 2023-10-24 DIAGNOSIS — F419 Anxiety disorder, unspecified: Secondary | ICD-10-CM | POA: Diagnosis present

## 2023-10-24 DIAGNOSIS — R45851 Suicidal ideations: Secondary | ICD-10-CM | POA: Diagnosis present

## 2023-10-24 DIAGNOSIS — E7800 Pure hypercholesterolemia, unspecified: Secondary | ICD-10-CM | POA: Diagnosis present

## 2023-10-24 DIAGNOSIS — F333 Major depressive disorder, recurrent, severe with psychotic symptoms: Secondary | ICD-10-CM | POA: Diagnosis not present

## 2023-10-24 DIAGNOSIS — F1420 Cocaine dependence, uncomplicated: Secondary | ICD-10-CM | POA: Diagnosis present

## 2023-10-24 DIAGNOSIS — Z5948 Other specified lack of adequate food: Secondary | ICD-10-CM

## 2023-10-24 DIAGNOSIS — E1165 Type 2 diabetes mellitus with hyperglycemia: Secondary | ICD-10-CM

## 2023-10-24 DIAGNOSIS — Z83438 Family history of other disorder of lipoprotein metabolism and other lipidemia: Secondary | ICD-10-CM | POA: Diagnosis not present

## 2023-10-24 DIAGNOSIS — I10 Essential (primary) hypertension: Secondary | ICD-10-CM | POA: Diagnosis present

## 2023-10-24 DIAGNOSIS — M19042 Primary osteoarthritis, left hand: Secondary | ICD-10-CM | POA: Diagnosis present

## 2023-10-24 DIAGNOSIS — Z79899 Other long term (current) drug therapy: Secondary | ICD-10-CM

## 2023-10-24 DIAGNOSIS — R739 Hyperglycemia, unspecified: Secondary | ICD-10-CM | POA: Diagnosis present

## 2023-10-24 DIAGNOSIS — Z5900 Homelessness unspecified: Secondary | ICD-10-CM | POA: Diagnosis not present

## 2023-10-24 DIAGNOSIS — R413 Other amnesia: Secondary | ICD-10-CM | POA: Diagnosis present

## 2023-10-24 DIAGNOSIS — R441 Visual hallucinations: Secondary | ICD-10-CM | POA: Diagnosis not present

## 2023-10-24 DIAGNOSIS — G8929 Other chronic pain: Secondary | ICD-10-CM | POA: Diagnosis present

## 2023-10-24 DIAGNOSIS — F1520 Other stimulant dependence, uncomplicated: Secondary | ICD-10-CM | POA: Diagnosis not present

## 2023-10-24 DIAGNOSIS — Z7984 Long term (current) use of oral hypoglycemic drugs: Secondary | ICD-10-CM | POA: Diagnosis not present

## 2023-10-24 DIAGNOSIS — Z5941 Food insecurity: Secondary | ICD-10-CM

## 2023-10-24 LAB — GLUCOSE, CAPILLARY
Glucose-Capillary: 269 mg/dL — ABNORMAL HIGH (ref 70–99)
Glucose-Capillary: 218 mg/dL — ABNORMAL HIGH (ref 70–99)
Glucose-Capillary: 247 mg/dL — ABNORMAL HIGH (ref 70–99)
Glucose-Capillary: 365 mg/dL — ABNORMAL HIGH (ref 70–99)
Glucose-Capillary: 276 mg/dL — ABNORMAL HIGH (ref 70–99)

## 2023-10-24 MED ORDER — ATORVASTATIN CALCIUM 10 MG PO TABS
40.0000 mg | ORAL_TABLET | Freq: Every day | ORAL | Status: DC
  Administered 2023-10-25 – 2023-11-01 (×8): 40 mg via ORAL
  Filled 2023-10-24 (×8): qty 4

## 2023-10-24 MED ORDER — INSULIN ASPART 100 UNIT/ML IJ SOLN
0.0000 [IU] | Freq: Three times a day (TID) | INTRAMUSCULAR | Status: DC
  Administered 2023-10-24: 15 [IU] via SUBCUTANEOUS
  Administered 2023-10-25: 8 [IU] via SUBCUTANEOUS
  Administered 2023-10-26 (×2): 5 [IU] via SUBCUTANEOUS
  Administered 2023-10-26: 8 [IU] via SUBCUTANEOUS
  Administered 2023-10-27: 15 [IU] via SUBCUTANEOUS
  Filled 2023-10-24 (×4): qty 1

## 2023-10-24 MED ORDER — OLANZAPINE 10 MG IM SOLR: 5.0000 mg | Freq: Three times a day (TID) | INTRAMUSCULAR | Status: DC | PRN

## 2023-10-24 MED ORDER — TRAZODONE HCL 50 MG PO TABS
50.0000 mg | ORAL_TABLET | Freq: Every evening | ORAL | Status: DC | PRN
  Administered 2023-10-24 – 2023-10-31 (×7): 50 mg via ORAL
  Filled 2023-10-24 (×7): qty 1

## 2023-10-24 MED ORDER — ACETAMINOPHEN 325 MG PO TABS
650.0000 mg | ORAL_TABLET | Freq: Four times a day (QID) | ORAL | Status: DC | PRN
  Administered 2023-10-26 – 2023-10-30 (×5): 650 mg via ORAL
  Filled 2023-10-24 (×5): qty 2

## 2023-10-24 MED ORDER — INSULIN ASPART 100 UNIT/ML IJ SOLN: 0.0000 [IU] | Freq: Three times a day (TID) | INTRAMUSCULAR | Status: DC

## 2023-10-24 MED ORDER — ALUM & MAG HYDROXIDE-SIMETH 200-200-20 MG/5ML PO SUSP: 30.0000 mL | ORAL | Status: DC | PRN

## 2023-10-24 MED ORDER — AMLODIPINE BESYLATE 5 MG PO TABS
10.0000 mg | ORAL_TABLET | Freq: Every day | ORAL | Status: DC
  Administered 2023-10-25 – 2023-11-01 (×8): 10 mg via ORAL
  Filled 2023-10-24 (×8): qty 2

## 2023-10-24 MED ORDER — MAGNESIUM HYDROXIDE 400 MG/5ML PO SUSP
30.0000 mL | Freq: Every day | ORAL | Status: DC | PRN
  Administered 2023-10-30: 30 mL via ORAL
  Filled 2023-10-24: qty 30

## 2023-10-24 MED ORDER — DULOXETINE HCL 60 MG PO CPEP
60.0000 mg | ORAL_CAPSULE | Freq: Every day | ORAL | Status: DC
  Administered 2023-10-25 – 2023-11-01 (×8): 60 mg via ORAL
  Filled 2023-10-24 (×8): qty 1

## 2023-10-24 MED ORDER — HYDROXYZINE HCL 10 MG PO TABS
10.0000 mg | ORAL_TABLET | Freq: Three times a day (TID) | ORAL | Status: DC | PRN
  Administered 2023-10-24 – 2023-10-31 (×4): 10 mg via ORAL
  Filled 2023-10-24 (×4): qty 1

## 2023-10-24 MED ORDER — INSULIN GLARGINE-YFGN 100 UNIT/ML ~~LOC~~ SOLN: 35.0000 [IU] | Freq: Every day | SUBCUTANEOUS | Status: DC

## 2023-10-24 MED ORDER — GABAPENTIN 300 MG PO CAPS
300.0000 mg | ORAL_CAPSULE | Freq: Three times a day (TID) | ORAL | Status: DC
  Administered 2023-10-24 – 2023-11-01 (×23): 300 mg via ORAL
  Filled 2023-10-24 (×23): qty 1

## 2023-10-24 MED ORDER — INSULIN GLARGINE-YFGN 100 UNIT/ML ~~LOC~~ SOLN
30.0000 [IU] | Freq: Every day | SUBCUTANEOUS | Status: DC
  Administered 2023-10-24 – 2023-10-31 (×8): 30 [IU] via SUBCUTANEOUS
  Filled 2023-10-24 (×10): qty 0.3

## 2023-10-24 MED ORDER — INSULIN GLARGINE-YFGN 100 UNIT/ML ~~LOC~~ SOLN
35.0000 [IU] | Freq: Every day | SUBCUTANEOUS | Status: DC
  Filled 2023-10-24: qty 0.35

## 2023-10-24 MED ORDER — INSULIN GLARGINE-YFGN 100 UNIT/ML ~~LOC~~ SOLN
5.0000 [IU] | Freq: Once | SUBCUTANEOUS | Status: AC
  Administered 2023-10-24: 5 [IU] via SUBCUTANEOUS
  Filled 2023-10-24: qty 0.05

## 2023-10-24 MED ORDER — DOXEPIN HCL 10 MG PO CAPS: 10.0000 mg | ORAL_CAPSULE | Freq: Every evening | ORAL | Status: DC | PRN

## 2023-10-24 MED ORDER — INSULIN GLARGINE-YFGN 100 UNIT/ML ~~LOC~~ SOLN
30.0000 [IU] | SUBCUTANEOUS | Status: DC
  Filled 2023-10-24: qty 0.3

## 2023-10-24 MED ORDER — NICOTINE 14 MG/24HR TD PT24
14.0000 mg | MEDICATED_PATCH | Freq: Every day | TRANSDERMAL | Status: DC
  Administered 2023-10-25: 14 mg via TRANSDERMAL
  Filled 2023-10-24 (×5): qty 1

## 2023-10-24 MED ORDER — OLANZAPINE 5 MG PO TBDP: 5.0000 mg | ORAL_TABLET | Freq: Three times a day (TID) | ORAL | Status: DC | PRN

## 2023-10-24 NOTE — Discharge Summary (Signed)
 Family Medicine Teaching [facility] Discharge Summary  Patient name: David Wyatt Medical record number: [id number] Date of birth: 06/21/1960 Age: 63 y.o. Gender: male Date of Admission: 10/22/2023  Date of Discharge: 10/24/23 Admitting Physician: Gladis Church, DO  Primary Care Provider: Delbert Clam, MD Consultants: psychiatry  Indication for Hospitalization: hyperglycemia, SI  Discharge Diagnoses/Problem List:  Principal Problem for Admission: hyperglycemia Other Problems addressed during stay:  Principal Problem:   Hyperglycemia due to diabetes mellitus (HCC) Active Problems:   Type 2 diabetes mellitus with hyperglycemia (HCC)   Suicidal behavior   MDD (major depressive disorder), recurrent episode, severe (HCC)   Hyperglycemia  Brief Hospital Course:  David Wyatt is a 63 y.o.male with a history of HTN, T2DM, HLD and neuropathy who was admitted to the [facility] Medicine Teaching Service at [facility] for hyperglycemia and SI. His hospital course is detailed below:  T2DM: Patient arrived to the ED with CBG >600 with elevated anion gap. Patient was not in DKA, pH was normal and denying emesis or abdominal pain. Patient was complaining of hunger and polyuria. Patient was started on endotool and IVF with much improvement with his CBGs. Patient transitioned to carb modified diet and receiving basal insulin  35 units and sensitive SSI with appropriate control of CBG. Patient medically stable for discharge to inpatient psychiatry with that insulin  regimen.  Suicidal Behavior: Patient says he has not been taking his home insulin  in an attempt to kill himself. Patient says he would like to go to the Behavioral Health unit to seek care. Psychiatry was consulted and recommended inpatient psychiatric hospitalization. Patient was placed on 1:1 monitoring. Patient here voluntarily. Psychiatry started medication regimen.  Other chronic conditions were medically managed with home medications  and formulary alternatives as necessary (HLD, HTN, neuropathy)  PCP Follow-up Recommendations: Close follow up for mood and insulin  compliance Housing resources    Results/Tests Pending at Time of Discharge:  Unresulted Labs (From admission, onward)    None        Disposition: [facility]  Discharge Condition: stable  Discharge Exam:  Vitals:   10/24/23 0700 10/24/23 0941  BP: 123/88 123/88  Pulse: 78   Resp: 11   Temp:    SpO2: 95%    Physical Exam Constitutional:      Appearance: Normal appearance.  Cardiovascular:     Rate and Rhythm: Normal rate.     Pulses: Normal pulses.     Heart sounds: Normal heart sounds.  Pulmonary:     Effort: Pulmonary effort is normal.     Breath sounds: Normal breath sounds.  Neurological:     General: No focal deficit present.     Mental Status: He is alert and oriented to person, place, and time. Mental status is at baseline.  Psychiatric:        Mood and Affect: Mood normal.        Behavior: Behavior normal.        Thought Content: Thought content normal.   Per Dr. Suzen  Significant Procedures: none  Significant Labs and Imaging:  Recent Labs  Lab 10/22/23 1759 10/22/23 1813 10/22/23 2316  WBC 5.7  --  8.6  HGB 12.9* 13.6 12.8*  HCT 38.7* 40.0 39.3  PLT 302  --  286   Recent Labs  Lab 10/22/23 1759 10/22/23 1813 10/22/23 2316 10/23/23 0348 10/23/23 0753 10/23/23 1153  NA 128* 128* 137 140 138 134*  K 4.2 4.1 3.7 3.5 4.4 5.2*  CL 93*  --  103 105  103 103  CO2 20*  --  23 25 26 25   GLUCOSE 867*  --  285* 77 159* 274*  BUN 21  --  22 19 16 15   CREATININE 1.28*  --  1.12 0.82 0.95 0.91  CALCIUM  9.1  --  9.6 9.2 8.7* 8.7*  MG  --   --   --   --  1.7  --   PHOS  --   --   --   --  3.5  --   ALKPHOS 122  --   --   --   --   --   AST 65*  --   --   --   --   --   ALT 38  --   --   --   --   --   ALBUMIN 3.1*  --   --   --   --   --    R foot XR 10/18/23  IMPRESSION: 1. 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   Discharge Medications:  Allergies as of 10/24/2023       Reactions   Lisinopril  Other (See Comments), Swelling   Lip Swelling, angioedema angioedema  Lip Swelling  Lip Swelling, angioedema        Medication List     STOP taking these medications    Lantus  SoloStar 100 UNIT/ML Solostar Pen Generic drug: insulin  glargine   NovoLOG  PenFill cartridge Generic drug: insulin  aspart Replaced by: insulin  aspart 100 UNIT/ML injection       TAKE these medications    Accu-Chek Guide Test test strip Generic drug: glucose blood Use as instructed to check blood sugar three times daily   Accu-Chek Guide w/Device Kit Use to test blood sugar 3 times daily   Accu-Chek Softclix Lancets lancets Use to check blood sugar three times daily as directed   amLODipine  10 MG tablet Commonly known as: NORVASC  Take 1 tablet (10 mg total) by mouth daily.   atorvastatin  40 MG tablet Commonly known as: LIPITOR Take 1 tablet (40 mg total) by mouth daily.   doxepin  10 MG capsule Commonly known as: SINEQUAN  Take 1 capsule (10 mg total) by mouth at bedtime as needed (Sleep).   DULoxetine  60 MG capsule Commonly known as: CYMBALTA  Take 1 capsule (60 mg total) by mouth daily.   gabapentin  300 MG capsule Commonly known as: NEURONTIN  Take 1 capsule (300 mg total) by mouth 3 (three) times daily.   hydrOXYzine  10 MG tablet Commonly known as: ATARAX  Take 1 tablet (10 mg total) by mouth 3 (three) times daily as needed for anxiety.   insulin  aspart 100 UNIT/ML injection Commonly known as: novoLOG  Inject 0-9 Units into the skin 3 (three) times daily with meals. Replaces: NovoLOG  PenFill cartridge   insulin  glargine-yfgn 100 UNIT/ML injection Commonly known as: SEMGLEE  Inject 0.35 mLs (35 Units total) into the skin at bedtime.   metFORMIN  500 MG tablet Commonly known as: GLUCOPHAGE  Take 1 tablet (500 mg total) by mouth 2 (two) times daily with a meal.   nicotine  7  mg/24hr patch Commonly known as: NICODERM CQ  - dosed in mg/24 hr Place 1 patch (7 mg total) onto the skin daily as needed (nicotine  withdrawal).   ondansetron  4 MG disintegrating tablet Commonly known as: ZOFRAN -ODT Take 1 tablet (4 mg total) by mouth every 8 (eight) hours as needed for nausea or vomiting.        Discharge Instructions: Please refer to Patient Instructions section of EMR  for full details.  Patient was counseled important signs and symptoms that should prompt return to medical care, changes in medications, dietary instructions, activity restrictions, and follow up appointments.   Follow-Up Appointments:   Stoney Blizzard, DO 10/24/2023, 2:44 PM PGY-2, [facility]

## 2023-10-24 NOTE — TOC Initial Note (Addendum)
 Transition of Care [facility]) - Initial/Assessment Note    Patient Details  Name: David Wyatt MRN: [id number] Date of Birth: 1960/11/03  Transition of Care [facility]) CM/SW Contact:    David Wyatt, LCSWA Phone Number: 10/24/2023, 10:39 AM  Clinical Narrative: Pt is currently experiencing homelessness, declined contact with any family supports, and has a walker at baseline.  CSW asked if the pt has a safe place to stay and the pt declined. CSW gained permission to supply resources for housing/homeless shelters.   Pt denied reliable transportation and is not able to get around the community. CSW informed patient that they may be eligible for medicaid and encouraged them to apply due to transportation to medical appointments. Pt stated understanding.  CSW asked about concerns of food and the pt stated they are not able to get food every time that they need. The pt was agreeable to food pantry resources.  Pt is pending a psych bed at [facility].   1423 - Pt has been accepted at [facility] and will dc to the inpatient psych unit.   CSW informed patient of the discharge plan and asked if they were willing to go and sign the voluntary admission form. Patient was agreeable and signed. Form is now in the chart and SW will schedule safe transport for pt to  Hospital. RN report to [phone].                  Expected Discharge Plan: Psychiatric Hospital Barriers to Discharge: Psych Bed not available   Patient Goals and CMS Choice Patient states their goals for this hospitalization and ongoing recovery are:: Findinf housing   Choice offered to / list presented to : NA      Expected Discharge Plan and Services In-house Referral: Clinical Social Work     Living arrangements for the past 2 months: Homeless Expected Discharge Date: 10/23/23                                    Prior Living Arrangements/Services Living arrangements for the past 2 months: Homeless Lives with:: David Wyatt (Comment)  (Homeless) Patient language and need for interpreter reviewed:: Yes Do you feel safe going back to the place where you live?: No   Pt is homeless  Need for Family Participation in Patient Care: Yes (Comment) Care giver support system in place?: No (comment) Current home services: DME David Wyatt) Criminal Activity/Legal Involvement Pertinent to Current Situation/Hospitalization: No - Comment as needed  Activities of Daily Living   ADL Screening (condition at time of admission) Independently performs ADLs?: Yes (appropriate for developmental age) Is the patient deaf or have difficulty hearing?: No Does the patient have difficulty seeing, even when wearing glasses/contacts?: No Does the patient have difficulty concentrating, remembering, or making decisions?: No  Permission Sought/Granted Permission sought to share information with : David Wyatt (comment) (pt declined family contacts) Permission granted to share information with : No  Share Information with NAME: Pt declined           Emotional Assessment Appearance:: Appears stated age Attitude/Demeanor/Rapport: Engaged Affect (typically observed): Flat, Pleasant Orientation: : Oriented to Self, Oriented to Place, Oriented to  Time, Oriented to Situation Alcohol / Substance Use: Illicit Drugs (Hx of substance use, no current use reported) Psych Involvement: Yes (comment)  Admission diagnosis:  Hyperglycemia [R73.9] Suicidal ideations [R45.851] H/O medication noncompliance [Z91.148] Diabetic ketoacidosis without coma associated with type 2 diabetes mellitus (HCC) [E11.10]  Hyperglycemia due to diabetes mellitus (HCC) [E11.65] Patient Active Problem List   Diagnosis Date Noted   Chronic health problem 10/22/2023   AKI (acute kidney injury) (HCC) 10/18/2023   Hyperglycemia 10/18/2023   Hyperglycemia due to diabetes mellitus (HCC) 09/27/2023   Malingering 07/15/2023   MDD (major depressive disorder), recurrent episode, severe (HCC)  05/19/2023   MDD (major depressive disorder), recurrent episode, 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   MDD (major depressive disorder), recurrent severe, 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   Substance induced mood disorder (HCC) 06/18/2022   Homelessness 06/18/2022   Inadequate community resources 06/18/2022   Inability to access community resources due to transportation insecurity 06/18/2022   Cocaine use disorder (HCC) 03/03/2022   Cocaine use disorder, severe, dependence (HCC) 01/21/2022   Suicidal behavior 01/20/2022   Acute respiratory failure with hypoxia (HCC) 10/13/2021   History of cocaine use 07/23/2021   Chronic right shoulder pain 07/23/2021   Renal stones 07/23/2021   Dermoid cyst of skin of back 07/23/2021   Uncontrolled type 2 diabetes mellitus with hyperglycemia (HCC) 03/22/2021   Left kidney mass 03/22/2021   Hydronephrosis of right kidney 02/23/2021   Type 2 diabetes mellitus with hyperglycemia (HCC) 02/23/2021   Tobacco abuse 08/19/2018   Hyperlipidemia 04/24/2017   GERD (gastroesophageal reflux disease) 06/13/2015   Diabetic neuropathy (HCC) 05/01/2015   Essential hypertension, benign 11/01/2013   Lumbar disc herniation 09/13/2013   Constipation 09/13/2013   Chronic back pain 09/13/2013   Felon of finger of left hand with lymphangitis 12/13/2012   Crack cocaine use 07/29/2012   Arthritis 07/29/2012   Weakness generalized 07/29/2012   PCP:  David Clam, MD Pharmacy:   David Wyatt - [facility] [address] [place] [address] Phone: [phone] Fax: [phone]  [facility] - [facility] [address] [place] [address] Phone: [phone] Fax: [phone]  [id number] [place], [place] - [address] AT [facility] OF [address] & David Wy[address] [place] [address] Phone: [phone] Fax: [phone]  [place] - [facility] [address] [place] [address] Phone: [phone] Fax: [phone]  [facility] [address] [place] [address] Phone: [phone] Fax: [phone]  [facility] [place], [place] - [address] [place] [address] Phone: [phone] Fax: [phone]  [id number] - [place], [place] - [address] AT [facility] [address] [place] [address] Phone: [phone] Fax: [phone]     Social Drivers of Health (SDOH) Social History: SDOH Screenings   Food Insecurity: Food Insecurity Present (10/23/2023)  Housing: High Risk (10/23/2023)  Transportation Needs: Unmet Transportation Needs (10/23/2023)  Utilities: At Risk (10/23/2023)  Alcohol Screen: Low Risk  (05/19/2023)  Depression (PHQ2-9): High Risk (10/20/2023)  Tobacco Use: High Risk (10/21/2023)   SDOH Interventions:     Readmission Risk Interventions     No data to display

## 2023-10-24 NOTE — Progress Notes (Signed)
 Patient is a 63 year old male admitted voluntarily to the [facility] floor from [facility] kidney unit. Patient presented to the ED stating that he was purposely eating high sugary foods in an effort to end his life. He also stated that if that didn't work then he would take high levels of insulin  to finish the job. Patient presents to assessment via transport chair but is ambulatory with a walker. He currently denies SI/HI/AVH. Patient's affect is appropriate and speech is logical and coherent. Patient denies anxiety and depression. He states that his main stressor is being homeless. Patient denies pain. Reports last BM today(8/15). Patient endorses smoking a few cigs. Denies substance and alcohol use.   Skin assessment and body search completed with Camellia, MHT. Skin: warm/dry, knot on back of neck. No contrabands found.  Emotional support and reassurance provided throughout admission intake. Consents signed. Afterwards, oriented patient to unit, room and call light, reviewed POC with all questions answered and concerns voiced. Patient verbalized understanding. Denies any needs at this time.  Will continue to monitor with ongoing Q 15 minute safety checks per unit protocol.

## 2023-10-24 NOTE — TOC Transition Note (Signed)
 Transition of Care [facility]) - Discharge Note   Patient Details  Name: David Wyatt MRN: [id number] Date of Birth: 23-Aug-1960  [facility]) CM/SW Contact:  Lendia Dais, LCSWA Phone Number: 10/24/2023, 3:28 PM   Clinical Narrative:  Pt dc'ing to [facility] for inpatient psych needs. CSW contacted safe transportation at 3:20. RN report to [phone].  CSW has placed housing and food pantry resources in the AVS.   Final next level of care: Psychiatric Hospital Barriers to Discharge: Barriers Resolved   Patient Goals and CMS Choice Patient states their goals for this hospitalization and ongoing recovery are:: Findinf housing   Choice offered to / list presented to : NA      Discharge Placement                Patient to be transferred to facility by: Safe Transport Name of family member notified: Pt declined family contact Patient and family notified of of transfer: 10/24/23  Discharge Plan and Services Additional resources added to the After Visit Summary for   In-house Referral: Clinical Social Work                                   Social Drivers of Health (SDOH) Interventions SDOH Screenings   Food Insecurity: Food Insecurity Present (10/23/2023)  Housing: High Risk (10/23/2023)  Transportation Needs: Unmet Transportation Needs (10/23/2023)  Utilities: At Risk (10/23/2023)  Alcohol Screen: Low Risk  (05/19/2023)  Depression (PHQ2-9): High Risk (10/20/2023)  Tobacco Use: High Risk (10/21/2023)     Readmission Risk Interventions     No data to display

## 2023-10-24 NOTE — Progress Notes (Addendum)
     Daily Progress Note Intern Pager: [phone]  Patient name: David Wyatt Medical record number: [id number] Date of birth: 1960-10-18 Age: 63 y.o. Gender: male  Primary Care Provider: Delbert Clam, MD Consultants: psych Code Status: full  Pt Overview and Major Events to Date:  8/13 - admitted  Assessment and Plan:  63 yo male presenting with HHS, now resolved hyperglycemia, and SI. Pertinent PMH/PSH includes T2DM, HLD, HTN.  He is medically stable to transfer to psychiatric service.  Assessment & Plan Type 2 diabetes mellitus with hyperglycemia (HCC) Blood glucose has been in mid 200s. We will plan on increase long acting insulin  for better coverage. We will give 5 units additional this afternoon. - Home Metformin  (HOLD) - Increase Semglee  from 30 to 35 units at bedtime - Continue sensitive SSI - Medically stable for discharge/transfer to psych service Suicidal behavior Reported he discontinued insulin  at home and was eating lots of sweets to intentionally die.  - psych consulted yesterday, should see today - will IVC if patients tries to leave AMA Chronic health problem HLD - continue home Lipitor 40mg  QD HTN - continue home amlodipine  10mg  QD Neuropathy - continue gabapentin  300mg  TID  FEN/GI: carb modified PPx: lovenox  Dispo:Inpatient psych   Subjective:  Doing well this morning w/ no complain. Pt was asking why he no longer take Metformin  which I have explained to him that we typically hold the medication during hospitalization. Last BM yesterday. BG remains elevated in 200s  Objective: Temp:  [98.1 F (36.7 C)-98.9 F (37.2 C)] 98.5 F (36.9 C) (08/15 0511) Pulse Rate:  [62-81] 78 (08/15 0700) Resp:  [11-21] 11 (08/15 0700) BP: (121-159)/(75-111) 123/88 (08/15 0700) SpO2:  [91 %-100 %] 95 % (08/15 0700) Weight:  [71.9 kg] 71.9 kg (08/14 1838)  Physical Exam Constitutional:      Appearance: Normal appearance.  Cardiovascular:     Rate and Rhythm:  Normal rate.     Pulses: Normal pulses.     Heart sounds: Normal heart sounds.  Pulmonary:     Effort: Pulmonary effort is normal.     Breath sounds: Normal breath sounds.  Neurological:     General: No focal deficit present.     Mental Status: He is alert and oriented to person, place, and time. Mental status is at baseline.  Psychiatric:        Mood and Affect: Mood normal.        Behavior: Behavior normal.        Thought Content: Thought content normal.     Laboratory: Most recent CBC Lab Results  Component Value Date   WBC 8.6 10/22/2023   HGB 12.8 (L) 10/22/2023   HCT 39.3 10/22/2023   MCV 94.2 10/22/2023   PLT 286 10/22/2023   Most recent BMP    Latest Ref Rng & Units 10/23/2023   11:53 AM  BMP  Glucose 70 - 99 mg/dL 725   BUN 8 - 23 mg/dL 15   Creatinine 9.38 - 1.24 mg/dL 9.08   Sodium 864 - 854 mmol/L 134   Potassium 3.5 - 5.1 mmol/L 5.2   Chloride 98 - 111 mmol/L 103   CO2 22 - 32 mmol/L 25   Calcium  8.9 - 10.3 mg/dL 8.7     Imaging/Diagnostic Tests: None  Suzen Houston NOVAK, DO 10/24/2023, 8:09 AM  PGY-1, [facility] FPTS Intern pager: [phone], text pages welcome Secure chat group [facility] Teaching Service

## 2023-10-24 NOTE — Discharge Summary (Shared)
 Family Medicine Teaching [facility] Discharge Summary  Patient name: David Wyatt Medical record number: [id number] Date of birth: 11/15/1960 Age: 63 y.o. Gender: male Date of Admission: 10/22/2023  Date of Discharge: 10/24/2023 Admitting Physician: Gladis Church, DO  Primary Care Provider: Delbert Clam, MD Consultants: Psychiatric service  Indication for Hospitalization: Hyperglycemia  Discharge Diagnoses/Problem List:  Hyperglycemia   Disposition: [facility] Psychiatric service  Discharge Condition: Good  Discharge Exam:  Physical Exam Cardiovascular:     Rate and Rhythm: Normal rate.     Pulses: Normal pulses.     Heart sounds: Normal heart sounds.  Pulmonary:     Effort: Pulmonary effort is normal.     Breath sounds: Normal breath sounds.  Abdominal:     Palpations: Abdomen is soft.  Musculoskeletal:        General: Normal range of motion.  Neurological:     Mental Status: Mental status is at baseline.      Brief Hospital Course:  David Wyatt is a 63 y.o.male with a history of HTN, T2DM, HLD and neuropathy who was admitted to the [facility] Medicine Teaching Service at [facility] for hyperglycemia and SI. His hospital course is detailed below:  T2DM: Patient arrived to the ED with CBG >600 with elevated anion gap. Patient was not in DKA, pH was normal and denying emesis or abdominal pain. Patient was complaining of hunger and polyuria. Patient was started on endotool and IVF with much improvement with his CBGs. Patient transitioned to carb modified diet and receiving basal insulin  30 units (home dose) and SSI with appropriate control of CBG. Patient medically stable for discharge to inpatient psychiatry with that insulin  regimen.  Suicidal Behavior: Patient says he has not been taking his home insulin  in an attempt to kill himself. Patient says he would like to go to the Behavioral Health unit to seek care. Psychiatry was consulted and recommended inpatient psychiatric  hospitalization. Patient was placed on 1:1 monitoring. Patient here voluntarily. Psychiatry started medication regimen.  Other chronic conditions were medically managed with home medications and formulary alternatives as necessary (HLD, HTN, neuropathy)  PCP Follow-up Recommendations:  Close follow up for mood and insulin  compliance   Issues for Follow Up:  Mood stability Insulin  compliance Suicidal idealization  Significant Procedures: None  Significant Labs and Imaging:  Recent Labs  Lab 10/21/23 1734 10/21/23 1744 10/22/23 1759 10/22/23 1813 10/22/23 2316  WBC 6.7  --  5.7  --  8.6  HGB 13.3   < > 12.9* 13.6 12.8*  HCT 40.1   < > 38.7* 40.0 39.3  PLT 293  --  302  --  286   < > = values in this interval not displayed.   Recent Labs  Lab 10/19/23 1842 10/19/23 1907 10/20/23 1708 10/20/23 1718 10/22/23 1759 10/22/23 1813 10/22/23 2316 10/23/23 0348 10/23/23 0753 10/23/23 1153  NA 126*   < > 130*   < > 128* 128* 137 140 138 134*  K 4.5   < > 4.4   < > 4.2 4.1 3.7 3.5 4.4 5.2*  CL 96*   < > 99   < > 93*  --  103 105 103 103  CO2 18*  --  21*   < > 20*  --  23 25 26 25   GLUCOSE 962*   < > 628*   < > 867*  --  285* 77 159* 274*  BUN 21   < > 24*   < > 21  --  22  19 16 15   CREATININE 1.48*   < > 1.47*   < > 1.28*  --  1.12 0.82 0.95 0.91  CALCIUM  8.3*  --  8.8*   < > 9.1  --  9.6 9.2 8.7* 8.7*  MG  --   --   --   --   --   --   --   --  1.7  --   PHOS  --   --   --   --   --   --   --   --  3.5  --   ALKPHOS 109  --  97  --  122  --   --   --   --   --   AST 22  --  22  --  65*  --   --   --   --   --   ALT 14  --  16  --  38  --   --   --   --   --   ALBUMIN 2.9*  --  3.0*  --  3.1*  --   --   --   --   --    < > = values in this interval not displayed.    ***  Results/Tests Pending at Time of Discharge: ***  Discharge Medications:  Allergies as of 10/24/2023       Reactions   Lisinopril  Other (See Comments), Swelling   Lip Swelling,  angioedema angioedema  Lip Swelling  Lip Swelling, angioedema     Med Rec must be completed prior to using this [facility]***       Discharge Instructions: Please refer to Patient Instructions section of EMR for full details.  Patient was counseled important signs and symptoms that should prompt return to medical care, changes in medications, dietary instructions, activity restrictions, and follow up appointments.   Follow-Up Appointments:   David Wyatt 10/24/2023, 2:28 PM PGY-1, [facility] Family Medicine

## 2023-10-24 NOTE — Inpatient Diabetes Management (Signed)
 Inpatient Diabetes Program Recommendations  AACE/ADA: New Consensus Statement on Inpatient Glycemic Control (2015)  Target Ranges:  Prepandial:   less than 140 mg/dL      Peak postprandial:   less than 180 mg/dL (1-2 hours)      Critically ill patients:  140 - 180 mg/dL   Lab Results  Component Value Date   GLUCAP 269 (H) 10/24/2023   HGBA1C 13.9 (H) 09/27/2023    Review of Glycemic Control  Diabetes history: DM2 Outpatient Diabetes medications: Novolog  10 units TID, Lantus  24 units at bedtime (not taken meds in approx 30 days), metformin  1000 mg BID Current orders for Inpatient glycemic control: Semglee  20 daily, Novolog  0-9 TID   HgbA1C - 13.9% Note: pt on meal coverage at home, Glucose trends increase after po intake, needs carbohydrate coverage.  Inpatient Diabetes Program Recommendations:    -   Consider adding Novolog  4 units TID with meals if eating > 50%.  -   Increase Novolog  correction scale to 0-15 units tid and add Novolog  hs scale  Will continue to follow.  Thanks,  Clotilda Bull RN, MSN, BC-ADM Inpatient Diabetes Coordinator Team Pager [phone] (8a-5p)

## 2023-10-24 NOTE — Plan of Care (Signed)
  Problem: Coping: Goal: Ability to verbalize frustrations and anger appropriately will improve Outcome: Progressing Goal: Ability to demonstrate self-control will improve Outcome: Progressing   Problem: Education: Goal: Knowledge of [facility] Education information/materials will improve Outcome: Not Progressing Goal: Verbalization of understanding the information provided will improve Outcome: Not Progressing

## 2023-10-24 NOTE — Tx Team (Signed)
 Initial Treatment Plan 10/24/2023 6:39 PM David Wyatt FMW:[id number]    PATIENT STRESSORS: Loss of housing   PATIENT STRENGTHS: Ability for insight  Motivation for treatment/growth    PATIENT IDENTIFIED PROBLEMS:   I am homeless                   DISCHARGE CRITERIA:  Ability to meet basic life and health needs Adequate post-discharge living arrangements Improved stabilization in mood, thinking, and/or behavior Verbal commitment to aftercare and medication compliance  PRELIMINARY DISCHARGE PLAN: Placement in alternative living arrangements  PATIENT/FAMILY INVOLVEMENT: This treatment plan has been presented to and reviewed with the patient, David Wyatt. The patient has been given the opportunity to ask questions and make suggestions.  Garen CINDERELLA Daring, RN 10/24/2023, 6:39 PM

## 2023-10-24 NOTE — Plan of Care (Signed)
   Problem: Education: Goal: Ability to describe self-care measures that may prevent or decrease complications (Diabetes Survival Skills Education) will improve Outcome: Progressing Goal: Individualized Educational Video(s) Outcome: Progressing   Problem: Coping: Goal: Ability to adjust to condition or change in health will improve Outcome: Progressing   Problem: Fluid Volume: Goal: Ability to maintain a balanced intake and output will improve Outcome: Progressing   Problem: Health Behavior/Discharge Planning: Goal: Ability to identify and utilize available resources and services will improve Outcome: Progressing Goal: Ability to manage health-related needs will improve Outcome: Progressing   Problem: Metabolic: Goal: Ability to maintain appropriate glucose levels will improve Outcome: Progressing   Problem: Nutritional: Goal: Maintenance of adequate nutrition will improve Outcome: Progressing Goal: Progress toward achieving an optimal weight will improve Outcome: Progressing   Problem: Skin Integrity: Goal: Risk for impaired skin integrity will decrease Outcome: Progressing   Problem: Tissue Perfusion: Goal: Adequacy of tissue perfusion will improve Outcome: Progressing   Problem: Education: Goal: Ability to describe self-care measures that may prevent or decrease complications (Diabetes Survival Skills Education) will improve Outcome: Progressing Goal: Individualized Educational Video(s) Outcome: Progressing   Problem: Cardiac: Goal: Ability to maintain an adequate cardiac output will improve Outcome: Progressing   Problem: Health Behavior/Discharge Planning: Goal: Ability to identify and utilize available resources and services will improve Outcome: Progressing Goal: Ability to manage health-related needs will improve Outcome: Progressing   Problem: Fluid Volume: Goal: Ability to achieve a balanced intake and output will improve Outcome: Progressing    Problem: Metabolic: Goal: Ability to maintain appropriate glucose levels will improve Outcome: Progressing   Problem: Nutritional: Goal: Maintenance of adequate nutrition will improve Outcome: Progressing Goal: Maintenance of adequate weight for body size and type will improve Outcome: Progressing   Problem: Respiratory: Goal: Will regain and/or maintain adequate ventilation Outcome: Progressing   Problem: Urinary Elimination: Goal: Ability to achieve and maintain adequate renal perfusion and functioning will improve Outcome: Progressing

## 2023-10-24 NOTE — Group Note (Signed)
 Date:  10/24/2023 Time:  10:36 PM  Group Topic/Focus:  Self Care:   The focus of this group is to help patients understand the importance of self-care in order to improve or restore emotional, physical, spiritual, interpersonal, and financial health. MHT went over sleep hygiene along with rules and expectations for our unit while also identifying who the nurses and techs are.    Participation Level:  Minimal  Participation Quality:  Appropriate  Affect:  Appropriate  Cognitive:  Appropriate  Insight: Lacking  Engagement in Group:  Engaged  Modes of Intervention:  Discussion  Additional Comments:    David Wyatt 10/24/2023, 10:36 PM

## 2023-10-24 NOTE — Discharge Instructions (Addendum)
 [employer] assistance programs Crisis assistance programs  -[facility] Arts development officer. If you are experiencing homelessness in [place], [place] , your first point of contact should be Pensions consultant. You can reach Coordinated Entry by calling ([phone] ([phone] or by emailing [email or web address] [email or web address].  Community access points: [employer] [phone] [address], [place]) every Tuesday from 9am-10am. [facility] ([address] [place]) every Wednesday from 8am-9am.   -[place] Coordinated Re-entry Daniel Mcalpine: Dial  211 and request. Offers referrals to homeless shelters in the area.    -The [employer] (337)582-2251) offers several services to local families, as funding allows. The Emergency Assistance Program (EAP), which they administer, provides household goods, free food, clothing, and financial aid to people in need in the [place]  area. The EAP program does have some qualification, and counselors will interview clients for financial assistance by written referral only. Referrals need to be made by the Department of Social Services or by other EAP approved human services agencies or charities in the area.  -[facility] of [employer], which can be reached at (913)488-4199, offers emergency assistance programs for those in need of help, such as food, rent assistance, a soup kitchen, shelter, and clothing. They are based in [facility]  but provide a number of services to those that qualify for assistance.   [facility] Department of Social Services may be able to offer temporary financial assistance and cash grants for paying rent and utilities, Help may be provided for local county residents who may be experiencing personal crisis when other resources, including government programs, are not available. Call ([phone]  -High [employer] is a [employer] agency, The organization can offer emergency assistance for paying rent, Caremark Rx, utilities, food, household products and furniture. They offer extensive emergency and transitional housing for families, children and single women, and also run a Boy's and [employer]. Thrift Shops, Secondary school teacher, and other aid offered too. [address] [place], [place]  [address], [phone]  -[place] Low Income Energy Assistance Program -- This is offered for [facility] families. The federal government created [employer] Program provides a one-time cash grant payment to help eligible low-income families pay their electric and heating bills. [address] [place], [place]  [address], 250-507-3615  -[place] Emergency Assistance -- A program offers emergency utility and rent funds for greater [employer] area residents. The program can also provide counseling and referrals to charities and government programs. Also provides food and a free meal program that serves lunch Mondays - Saturdays and dinner seven days per week to individuals in the community. [address], [place], [place]  [address], ([phone]  -[employer] - Offers affordable apartment and housing communities across      [place] and [place]. The low income and seniors can access public housing, rental assistance to qualified applicants, and apply for the section 8 rent subsidy program. Other programs include Chiropractor and Engineer, maintenance. [address] [place], [place]  [address], dial  [phone].  -The [facility] provides transitional housing to veterans and the disabled. Clients will also access other services too, including assistance in applying for Disability, life skills classes, case management, and assistance in finding permanent housing. [address], [place], [place]  [place] [address], call [phone]  -[place] Transitional Housing through [facility] is for people who were just  evicted or that are formerly homeless. The non-profit will also help then gain self-sufficiency, find a home or apartment to live in, and also provides information on rent assistance when needed. Phone [phone]  -The [place] [facility] [facility] helps low income, elderly, or disabled residents in seven counties in the [place] ([place], [place], [place], [place], [place], [place], [place], and [place]) save energy and reduce their utility bills by improving energy efficiency. Phone [phone].  -[employer] is located in the [place] [facility] in the [employer], [address], [place], [place] [address]. Parking is in the rear of the building. Phone: [phone]   General Email: info@gsohc .org  GHC provides free housing counseling assistance in locating affordable rental housing or housing with support services for families and individuals in crisis and the chronically homeless. We provide potential resources for other housing needs like utilities. Our trained counselors also work with clients on budgeting and financial literacy in effort to empower them to take control of their financial situations. [employer] collaborates with homeless service providers and other stakeholders as part of the [employer] COC (Continuum of Care). The (COC) is a regional/local planning body that coordinates housing and services funding for homeless families and individuals. The role of [facility] in the COC is through housing counseling to work with people we serve on diversion strategies for those that are at imminent risk of becoming homeless. We also work with the Coordinated Assessment/Entry Specialist who attempts to find temporary solutions and/or connects the people  to Housing First, Rapid Re-housing or transitional housing programs. Our Homelessness Prevention Housing Counselors meet with clients on business days (Monday-Fridays, except scheduled holidays) from 8:30 am to 4:30 pm.  Legal assistance for evictions, foreclosure, and more -If you need free legal advice on civil issues, such as foreclosures, evictions, Electronics engineer, government programs, domestic issues and more, Landscape architect of [facility]) is a Associate Professor firm that provides free legal services and counsel to lower income people, seniors, disabled, and others, The goal is to ensure everyone has access to justice and fair representation. Call them at [phone].  [facility] Studies can provide info about obtaining legal assistance with evictions. Phone ([phone].  Data processing manager  The [employer], [employer]. offers job and Dispensing optician. Resources are focused on helping students obtain the skills and experiences that are necessary to compete in today's challenging and tight job market. The non-profit faith-based community action agency offers internship trainings as well as classroom instruction. Classes are tailored to meet the needs of people in the [facility] region. [place], [place] [address], [phone]  Foreclosure prevention/Debt Services Family Services of the [employer] Credit Counseling Service inludes debt and foreclosure prevention programs for local families. This includes money management, financial advice, budget review and development of a written action plan with a Pensions consultant to help solve specific individual financial problems. In addition, housing and mortgage counselors can also provide pre- and post-purchase homeownership counseling, default resolution counseling (to prevent foreclosure) and reverse mortgage counseling. A Debt Management Program allows  people and families with a high level of credit card or medical debt to consolidate and repay consumer debt and loans to creditors and rebuild positive credit ratings and scores. Contact (336) D7650557.  Community clinics in [place] -Health Department [facility]: [address], [place], [address]. [phone].  -Health Department [place] [facility]: [phone]  [address], [facility], [address]. [phone].  -[facility] Network offers medical care through a group of doctors, pharmacies and other healthcare related agencies that offer services for low income, uninsured adults in [place]. Also offers adult Dental care and assistance with applying for an [employer]. Call [phone].   [facility]. This center provides low-cost health care to those without health insurance. Services offered include an onsite pharmacy. Phone [phone]. [address], [place].  -Medication Assistance Program serves as a link between pharmaceutical companies and patients to provide low cost or free prescription medications. This service is available for residents who meet certain income restrictions and have no insurance coverage. PLEASE CALL [phone] [place]) OR [phone] ([place])  -One Step Further: Materials engineer, The [employer] Support & Nutrition Program, [employer]. Call [phone]/ [phone].  Food pantry and assistance -Urban Ministry-Food Bank: [address], [place] [address]. Phone [phone]  -Blessed Table Food Pantry: [address] [place], [place] [address]. [phone].  -Missionary Ministry: has the purpose of visiting the sick and shut-ins and provide for needs in the surrounding communities. Call [phone]. Email: [email or web address] [email or web address] This program provides: Food box for seniors, Financial assistance, Food to meet basic  nutritional needs.  -Meals on Wheels with Senior Resources: [facility] residents age 64 and over who are homebound and unable to obtain and prepare a nutritious meal for themselves are eligible for this service. There may be a waiting list in certain parts of [facility] if the route in that area is full. If you are in [facility] and [place] call [phone] to register. For all other areas call [phone] to register.  -Greater Dietitian: https://findfood.BargainContractor.si  TRANSPORTATION: -[employer] Department of Health: Call [facility] and [employer] at [phone] for details. AttractionGuides.es  -Access [place]: Access [place] is the [employer] Agency's shared-ride transportation service for eligible riders who have a disability that prevents them from riding the fixed route bus. Call ([phone]. Access [place] riders must pay a fare of $1.50 per trip, or may purchase a 10-ride punch card for $14.00 ($1.40 per ride) or a 40-ride punch card for $48.00 ($1.20 per ride).  -The Shepherd's WHEELS rideshare transportation service is provided for senior citizens (60+) who live independently within [place] city limits and are unable to drive or have limited access to transportation. Call [phone] to schedule an appointment.  -[place] Transportation: For Medicare or Medicaid recipients call ([phone]?[name] Ambulance, wheelchair fleeta, and ambulatory quotes available.   FLEEING VIOLENCE: -Family Services of the [place]- 24/7 Crisis line 778-819-0325) -[facility] Justice Centers: (336) [phone]-[phone] [phone])  [place] 2-1-1 is another useful way to locate resources in the community. Visit ShedSizes.ch to find service information online. If you need additional assistance, 2-1-1 Referral Specialists are available 24 hours a day, every day by dialing  2-1-1 or [phone] from any phone. The call is free, confidential, and available in any language.  Affordable Housing Search http://www.nchousingsearch.[facility])   M-F 8a-3p [address]  [place], [place] [address] [phone] Services include: laundry, barbering, support groups, case management, phone & computer access, showers, AA/NA mtgs, mental health/substance abuse nurse, job skills class, disability information, VA assistance, spiritual classes, etc. Winter Shelter available when temperatures are less than 32 degrees.   HOMELESS SHELTERS [facility] at [facility]- Call [phone] ext. [phone]  or ext. 336. Located at [address], [place], [place] [address]  [facility] Mens Shelter- Call [phone]. Located at [address], [place] [address].  [facility]- [employer]. Call [phone]. Office located at [address], [place] [address].  [facility] Family Housing through [place] [phone].  [facility] Family [facility]ter- Call [phone]. Located at [address] [place], [place], [place] [address].  Room at the Inn-For Pregnant mothers. Call [phone]. Located at [address] [place], [address].  [facility]-For men in [place]. Call [phone]. [address] [facility]-Shelter in [place]. Call [phone].  Home of [employer] for [employer] ([phone]. Office located at [address], [place], [address].  [employer] be agreeable to help with chores. Call [phone] ext. [phone].  Men's: [address] [place], [place] [address]. Women's: [facility]  [address] [place], [place] [address]  Crisis Services Therapeutic Alternatives Mobile Crisis Management- [phone]  [facility] [address] [place], [place] [address]. Phone: [phone]   [facility] [employer] Address: [address] [place], [place] [address] Phone Number: [phone] Hours of Operation: Residents of [place] can come to obtain food Monday through Friday from 8:30am until 3:30pm. Photo ID and Social Security cards required for all residents of a household. Can come six times a year  THE BLESSED TABLE Address: [address] [place], [place] [address] Phone Number: [phone] Hours of Operation: Operates Tuesday-Friday 10:00 a.m. to 1 p.m. Requirements: Referral from DSS needed. May come 6 times a year, 30 days apart. Photo ID and SS required for all residents of household.  [facility] Address: [address] [place], [place] [address] Phone Number: [phone] Hours of Operation: Food pantry is open on the last Saturday of each month from 10:00 am - 12:00 noon. No appointment needed. No qualifications.  [facility] Address: [address] [place], [place] [address] Phone Number: [phone]. [phone] Hours of Operation: Must make reservations to pick up food on Saturdays. Sign ups for Saturday pick up beginning at 8:30 a.m. on Monday morning.  [facility] Address: [address] [place], [place] [address] Phone Number: [phone] Hours of Operation: If you need food, bring proper identification such as a driver's license to receive a bag of food once a month. Requirements: Can come once every 30 days with referral DSS, Holiday representative, Mental health etc. Each referral good for six visits. Photo ID required. *1st visit no referral required.  [facility] Address: [address] [place], [place] [address] Phone Number: [phone]  [place] [facility] Address: [address] [place], [place] [address] Phone Number: [phone] Hours of Operation:  You can register at [email or web address]/ for free groceries  FREE INDEED FOOD PANTRY Address: [address] [place], [place] [address] Phone Number: [phone] Hours of Operation: Drive through giveaway, first come first served. Every 3rd Saturday 11AM - 1PM  [facility] [address] Address: [address] [place] [address] Phone Number: [phone]   [place]  HAND TO HAND FOOD PANTRY Address: [address] [place], [place] [address] Phone Number: [phone] Hours of Operation: Once a month every 3rd Saturday  [facility] Address: [address] [place], [place] [address] Phone Number: [phone] Hours of Operation: Distribution happens from 9:00-10:00 a.m. every Saturday.     [facility]S Address: [address] [place], [place] [address] Phone Number: [phone] Hours of Operation: ONCE a week for the community food distribution held every Tuesday, Wednesday and Thursday from 11 a.m. - 2:00 p.m. Food is available on a first come, first serve basis and varies week to week. No appointment necessary for drive thru pick up.  [facility] Address: [address] [place], [place] [address] Phone Number: [phone] Hours of Operation: Open every 3rd Thursday 9:30 a.m. - 11:00 a.m.  [facility] Address: [address] [place], [place] [address] Phone Number: [phone] Hours of Operation: Please call for hours, directions, and questions  [facility] [place] [facility]OD [facility]ANCE Address: [address], [place], [place]  [address] Phone Number: [phone] Website: https://www.Hollyguns.co.za Food Finder app: https://findfood.ghpfa.org  [employer] Address: [address] [place], [place] [address] Phone Number: [phone] Hours of Operation: Contact Bree Harpe. Enrolled Substance Abuse Clients Only  [facility] Address: [address] [place], [address]  Phone Number: [phone] Hours of Operation: Contact Bartley Irving. Food pantry open the 3rd Saturday of each month from 9 a.m. -12 p.m.  only  [place] [facility] Address: [address] [place], [place] [address] Phone Number: [phone] Hours of Operation: Contact Geni Lee. Emergency food bank open on Saturdays by appointment only  [facility] Address: [address] [place], [place] [address] Phone Number: [phone] Hours of Operation: No specific contact person; Anyone can help  [facility] Address: [address] [place], [place] [address] Phone Number: [phone] Hours of Operation: Contact Medford Molt. Agency gives out a bag of food every Thursday from 2-4 p.m. only, and also provides a community meal every Thursday between 5-6 p.m. Other services provided include rent/mortgage and utility assistance, women's winter shelter, thrift store, and senior adult activities.  [facility] OF [place] Address: [address] [place], [place] [address] Phone Number: [phone] Hours of Operation: The Emergency Food Assistance Program provides individuals and families with a generous supply of food including meat, fresh vegetables, and nonperishable items. The food box contains five days' worth of food, and each family or individual can receive a box once per month. M, W, Th, Fr 11am-2pm, walk-ins welcome.  [facility] Address: [address] [place], [place] [address]  Phone Number: [phone] Hours of Operation: Contact Asia Cathlean. Tuesdays and Thursdays from 11am - 3pm by appointment only

## 2023-10-24 NOTE — Assessment & Plan Note (Signed)
 HLD - continue home Lipitor 40mg  QD HTN - continue home amlodipine  10mg  QD Neuropathy - continue gabapentin  300mg  TID

## 2023-10-24 NOTE — Assessment & Plan Note (Signed)
 Reported he discontinued insulin  at home and was eating lots of sweets to intentionally die.  - psych consulted yesterday, should see today - will IVC if patients tries to leave AMA

## 2023-10-24 NOTE — Assessment & Plan Note (Addendum)
 Blood glucose has been in mid 200s. We will plan on increase long acting insulin  for better coverage. We will give 5 units additional this afternoon. - Home Metformin  (HOLD) - Increase Semglee  from 30 to 35 units at bedtime - Continue sensitive SSI - Medically stable for discharge/transfer to psych service

## 2023-10-24 NOTE — Group Note (Signed)
 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 Patients and LRT discuss what it means to be "at peace", what it feels like physically and mentally. Pts are given a canvas and watercolor paint to use and encouraged to draw their idea of a peaceful place. Pts and LRT discuss how they use this in their daily life post discharge. Pts are encouraged to take their canvas home with them as a reminder to find their peaceful place whenever they are feeling depressed, anxious, etc.    Goal Area(s) Addressed:  Patient will identify what it means to experience a "peaceful" emotion. Patient will identify a new coping skill.  Patient will express their emotions through art. Patients will increase communication by talking with LRT and peers while in group.   Affect/Mood: N/A   Participation Level: Did not attend    Clinical Observations/Individualized Feedback: Patient did not attend group.   Plan: Continue to engage patient in RT group sessions 2-3x/week.   Jeoffrey BRAVO Celestia, LRT, CTRS 10/24/2023 5:01 PM
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
## 2023-10-25 DIAGNOSIS — F332 Major depressive disorder, recurrent severe without psychotic features: Secondary | ICD-10-CM | POA: Diagnosis not present

## 2023-10-25 LAB — GLUCOSE, CAPILLARY
Glucose-Capillary: 88 mg/dL (ref 70–99)
Glucose-Capillary: 277 mg/dL — ABNORMAL HIGH (ref 70–99)
Glucose-Capillary: 253 mg/dL — ABNORMAL HIGH (ref 70–99)
Glucose-Capillary: 112 mg/dL — ABNORMAL HIGH (ref 70–99)
Glucose-Capillary: 369 mg/dL — ABNORMAL HIGH (ref 70–99)
Glucose-Capillary: 405 mg/dL — ABNORMAL HIGH (ref 70–99)
Glucose-Capillary: 369 mg/dL — ABNORMAL HIGH (ref 70–99)

## 2023-10-25 MED ORDER — METFORMIN HCL 500 MG PO TABS
500.0000 mg | ORAL_TABLET | Freq: Two times a day (BID) | ORAL | Status: DC
  Administered 2023-10-25 – 2023-11-01 (×14): 500 mg via ORAL
  Filled 2023-10-25 (×15): qty 1

## 2023-10-25 MED ORDER — BUPROPION HCL ER (XL) 150 MG PO TB24
150.0000 mg | ORAL_TABLET | Freq: Every day | ORAL | Status: DC
  Administered 2023-10-25 – 2023-10-28 (×4): 150 mg via ORAL
  Filled 2023-10-25 (×4): qty 1

## 2023-10-25 NOTE — Progress Notes (Signed)
   10/25/23 0525  Sleep (Behavioral Health Patients Only)  Calculate sleep? (Click Yes once per 24 hr at 0600 safety check) Yes  Documented sleep last 24 hours 7.25   Patient compliant medications reported anxiety PRN Vistaril  given at HS and Trazodone  for sleep. Denies SI/HI/A/VH and verbally contracts for safety. Support and encouragement provided as needed. Patient ambulating with front wheel walker. All fall protocol in place Patient remains safe.

## 2023-10-25 NOTE — Progress Notes (Signed)
 Patient requested his blood sugar to be taken because he felt low. At 1021 the reading was 277 mg/dL.

## 2023-10-25 NOTE — H&P (Addendum)
 Psychiatric Admission Assessment Adult  Patient Identification: David Wyatt MRN:  [id number] Date of Evaluation:  10/25/2023 Chief Complaint:  MDD (major depressive disorder), recurrent episode, severe (HCC) [F33.2] Principal Diagnosis: MDD (major depressive disorder), recurrent episode, severe (HCC) Diagnosis:  Principal Problem:   MDD (major depressive disorder), recurrent episode, severe (HCC)  History of Present Illness:   Patient is a 63 year old man who his struggling with homelessness cocaine abuse depression and humming in endorsing suicidal thoughts with no specific plan at this time, Associated Signs/Symptoms: Depression Symptoms:  depressed mood, anhedonia, insomnia, (Hypo) Manic Symptoms:  none Anxiety Symptoms:  none Psychotic Symptoms:  none PTSD Symptoms: Negative Total Time spent with patient: 45 minutes  Past Psychiatric History:  history of cocaine dependence and has been here before  Is the patient at risk to self? Yes.    Has the patient been a risk to self in the past 6 months? Yes.    Has the patient been a risk to self within the distant past? Yes.    Is the patient a risk to others? No.  Has the patient been a risk to others in the past 6 months? No.  Has the patient been a risk to others within the distant past? No.   [place] Scale:  Flowsheet Row Admission (Current) from 10/24/2023 in [facility] ED to Hosp-Admission (Discharged) from 10/22/2023 in [facility] KIDNEY UNIT ED from 10/21/2023 in [facility] Emergency Department at [facility]  C-SSRS RISK CATEGORY High Risk High Risk No Risk     Prior Inpatient Therapy: Yes.   If yes, describe has been admitted here before  Prior Outpatient Therapy: No. If yes, describe none   Alcohol Screening: 1. How often do you have a drink containing alcohol?: Never 2. How many drinks containing alcohol do you have on a typical day when you are drinking?: 1 or 2 3. How  often do you have six or more drinks on one occasion?: Never AUDIT-C Score: 0 4. How often during the last year have you found that you were not able to stop drinking once you had started?: Never 5. How often during the last year have you failed to do what was normally expected from you because of drinking?: Never 6. How often during the last year have you needed a first drink in the morning to get yourself going after a heavy drinking session?: Never 7. How often during the last year have you had a feeling of guilt of remorse after drinking?: Never 8. How often during the last year have you been unable to remember what happened the night before because you had been drinking?: Never 9. Have you or someone else been injured as a result of your drinking?: No 10. Has a relative or friend or a doctor or another health worker been concerned about your drinking or suggested you cut down?: No Alcohol Use Disorder Identification Test Final Score (AUDIT): 0 Substance Abuse History in the last 12 months:  No. Consequences of Substance Abuse: Family Consequences:  no family support Previous Psychotropic Medications: No  Psychological Evaluations: No  Past Medical History:  Past Medical History:  Diagnosis Date   Angioedema of lips 07/28/2012   left upper (07/29/2012)   Arthritis    hands and back   Chronic back pain    Cocaine abuse (HCC)    Diabetic neuropathy (HCC)    High cholesterol    Hypertension    Neuropathy    Type  II diabetes mellitus (HCC)     Past Surgical History:  Procedure Laterality Date   BACK SURGERY     CYSTOSCOPY W/ URETERAL STENT PLACEMENT Right 02/23/2021   Procedure: CYSTOSCOPY WITH RETROGRADE PYELOGRAM/URETERAL STENT PLACEMENT;  Surgeon: Selma Donnice SAUNDERS, MD;  Location: [facility] ORS;  Service: Urology;  Laterality: Right;   CYSTOSCOPY/URETEROSCOPY/HOLMIUM LASER/STENT PLACEMENT Right 12/05/2022   Procedure: CYSTOSCOPY, RIGHT URETEROSCOPY, AND RIGHT URETERAL STENT PLACEMENT;   Surgeon: Shane Steffan BROCKS, MD;  Location: [facility];  Service: Urology;  Laterality: Right;  90 MINUTES   HERNIA REPAIR Right 04/01/2012   I & D EXTREMITY Left 12/13/2012   Procedure: IRRIGATION AND DEBRIDEMENT LEFT THUMB;  Surgeon: Franky SAUNDERS Curia, MD;  Location: [facility] OR;  Service: Orthopedics;  Laterality: Left;   INCISION AND DRAINAGE OF WOUND     boil on back/notes 07/14/2008 (07/29/2012)   INGUINAL HERNIA REPAIR  04/01/2012   Procedure: HERNIA REPAIR INGUINAL ADULT;  Surgeon: Sherlean JINNY Laughter, MD;  Location: [facility] OR;  Service: General;  Laterality: Right;   INGUINAL HERNIA REPAIR Left 09/06/2016   Procedure: OPEN REPAIR LEFT INGUINAL HERNIA;  Surgeon: Tanda Locus, MD;  Location: [facility] OR;  Service: General;  Laterality: Left;   INSERTION OF MESH Left 09/06/2016   Procedure: INSERTION OF MESH;  Surgeon: Tanda Locus, MD;  Location: [facility] OR;  Service: General;  Laterality: Left;   TONSILLECTOMY     Family History:  Family History  Problem Relation Age of Onset   Diabetes Mother    Kidney disease Mother    Hyperlipidemia Mother    Hyperlipidemia Father    Diabetes Father    Family Psychiatric  History:  none reported Tobacco Screening:  Social History   Tobacco Use  Smoking Status Every Day   Current packs/day: 0.25   Average packs/day: 0.3 packs/day for 30.0 years (7.5 ttl pk-yrs)   Types: Cigarettes  Smokeless Tobacco Former   Quit date: 08/15/2015  Tobacco Comments   04/29/16 2  cigs daily, 10/27/17 sometimes < .25 PPD    [facility] Tobacco Counseling     Are you interested in Tobacco Cessation Medications?  Yes, implement Nicotene Replacement Protocol Counseled patient on smoking cessation:  Yes Reason Tobacco Screening Not Completed: No value filed.       Social History:  Social History   Substance and Sexual Activity  Alcohol Use No   Alcohol/week: 0.0 standard drinks of alcohol     Social History   Substance and Sexual Activity  Drug Use Not Currently   Types: Crack cocaine    Comment: last used in July 2025    Additional Social History: Marital status: Single Does patient have children?: Yes How many children?: 3 How is patient's relationship with their children?: poor. Pt children reside in [place]  and he has not spoken to them in awhile.           Physical Exam HENT:     Head: Normocephalic.  Pulmonary:     Effort: Pulmonary effort is normal.  Abdominal:     General: Abdomen is flat.  Neurological:     Mental Status: He is alert.                  Allergies:   Allergies  Allergen Reactions   Lisinopril  Other (See Comments) and Swelling    Lip Swelling, angioedema  angioedema  Lip Swelling  Lip Swelling, angioedema   Lab Results:  Results for orders placed or performed during the hospital encounter of  10/24/23 (from the past 48 hours)  Glucose, capillary     Status: Abnormal   Collection Time: 10/24/23  5:40 PM  Result Value Ref Range   Glucose-Capillary 365 (H) 70 - 99 mg/dL    Comment: Glucose reference range applies only to samples taken after fasting for at least 8 hours.  Glucose, capillary     Status: Abnormal   Collection Time: 10/24/23 10:18 PM  Result Value Ref Range   Glucose-Capillary 276 (H) 70 - 99 mg/dL    Comment: Glucose reference range applies only to samples taken after fasting for at least 8 hours.  Glucose, capillary     Status: None   Collection Time: 10/25/23  7:18 AM  Result Value Ref Range   Glucose-Capillary 88 70 - 99 mg/dL    Comment: Glucose reference range applies only to samples taken after fasting for at least 8 hours.  Glucose, capillary     Status: Abnormal   Collection Time: 10/25/23 10:21 AM  Result Value Ref Range   Glucose-Capillary 277 (H) 70 - 99 mg/dL    Comment: Glucose reference range applies only to samples taken after fasting for at least 8 hours.  Glucose, capillary     Status: Abnormal   Collection Time: 10/25/23 11:26 AM  Result Value Ref Range   Glucose-Capillary 253 (H) 70 -  99 mg/dL    Comment: Glucose reference range applies only to samples taken after fasting for at least 8 hours.  Glucose, capillary     Status: Abnormal   Collection Time: 10/25/23  4:45 PM  Result Value Ref Range   Glucose-Capillary 112 (H) 70 - 99 mg/dL    Comment: Glucose reference range applies only to samples taken after fasting for at least 8 hours.    Blood Alcohol level:  Lab Results  Component Value Date   [facility] <15 10/22/2023   ETH <15 07/15/2023    Metabolic Disorder Labs:  Lab Results  Component Value Date   HGBA1C 13.9 (H) 09/27/2023   MPG 352.23 09/27/2023   MPG 191.51 05/23/2023   Lab Results  Component Value Date   PROLACTIN 6.9 06/19/2022   Lab Results  Component Value Date   CHOL 197 10/23/2023   TRIG 113 10/23/2023   HDL 62 10/23/2023   CHOLHDL 3.2 10/23/2023   VLDL 23 10/23/2023   LDLCALC 112 (H) 10/23/2023   LDLCALC 89 05/18/2023    Current Medications: Current Facility-Administered Medications  Medication Dose Route Frequency Provider Last Rate Last Admin   acetaminophen  (TYLENOL ) tablet 650 mg  650 mg Oral Q6H PRN Starkes-Perry, Takia S, FNP       alum & mag hydroxide-simeth (MAALOX/MYLANTA) 200-200-20 MG/5ML suspension 30 mL  30 mL Oral Q4H PRN Starkes-Perry, Majel RAMAN, FNP       amLODipine  (NORVASC ) tablet 10 mg  10 mg Oral Daily Wilkie Majel RAMAN, FNP   10 mg at 10/25/23 1015   atorvastatin  (LIPITOR) tablet 40 mg  40 mg Oral Daily Wilkie Majel RAMAN, FNP   40 mg at 10/25/23 1016   doxepin  (SINEQUAN ) capsule 10 mg  10 mg Oral QHS PRN Wilkie Majel RAMAN, FNP       DULoxetine  (CYMBALTA ) DR capsule 60 mg  60 mg Oral Daily Wilkie Majel RAMAN, FNP   60 mg at 10/25/23 1016   gabapentin  (NEURONTIN ) capsule 300 mg  300 mg Oral TID Wilkie Majel RAMAN, FNP   300 mg at 10/25/23 1713   hydrOXYzine  (ATARAX ) tablet 10 mg  10  mg Oral TID PRN Starkes-Perry, Takia S, FNP   10 mg at 10/24/23 2109   insulin  aspart (novoLOG ) injection 0-15 Units   0-15 Units Subcutaneous TID WC Jadapalle, Sree, MD   8 Units at 10/25/23 1214   insulin  glargine-yfgn (SEMGLEE ) injection 30 Units  30 Units Subcutaneous QHS Jadapalle, Sree, MD   30 Units at 10/24/23 2239   magnesium  hydroxide (MILK OF MAGNESIA) suspension 30 mL  30 mL Oral Daily PRN Starkes-Perry, Takia S, FNP       metFORMIN  (GLUCOPHAGE ) tablet 500 mg  500 mg Oral BID WC Dior Dominik R, MD   500 mg at 10/25/23 1713   nicotine  (NICODERM CQ  - dosed in mg/24 hours) patch 14 mg  14 mg Transdermal Daily Jadapalle, Sree, MD   14 mg at 10/25/23 1015   OLANZapine  (ZYPREXA ) injection 5 mg  5 mg Intramuscular TID PRN Starkes-Perry, Takia S, FNP       OLANZapine  zydis (ZYPREXA ) disintegrating tablet 5 mg  5 mg Oral TID PRN Starkes-Perry, Takia S, FNP       traZODone  (DESYREL ) tablet 50 mg  50 mg Oral QHS PRN Bobbitt, Shalon E, NP   50 mg at 10/24/23 2238   PTA Medications: Medications Prior to Admission  Medication Sig Dispense Refill Last Dose/Taking   Accu-Chek Softclix Lancets lancets Use to check blood sugar three times daily as directed 100 each 1    amLODipine  (NORVASC ) 10 MG tablet Take 1 tablet (10 mg total) by mouth daily. 90 tablet 0    atorvastatin  (LIPITOR) 40 MG tablet Take 1 tablet (40 mg total) by mouth daily. 90 tablet 0    Blood Glucose Monitoring Suppl (ACCU-CHEK GUIDE) w/Device KIT Use to test blood sugar 3 times daily 1 kit 0    doxepin  (SINEQUAN ) 10 MG capsule Take 1 capsule (10 mg total) by mouth at bedtime as needed (Sleep).      DULoxetine  (CYMBALTA ) 60 MG capsule Take 1 capsule (60 mg total) by mouth daily.      gabapentin  (NEURONTIN ) 300 MG capsule Take 1 capsule (300 mg total) by mouth 3 (three) times daily. 270 capsule 0    glucose blood (ACCU-CHEK GUIDE TEST) test strip Use as instructed to check blood sugar three times daily 100 each 1    hydrOXYzine  (ATARAX ) 10 MG tablet Take 1 tablet (10 mg total) by mouth 3 (three) times daily as needed for anxiety.      insulin   aspart (NOVOLOG ) 100 UNIT/ML injection Inject 0-9 Units into the skin 3 (three) times daily with meals.      insulin  glargine-yfgn (SEMGLEE ) 100 UNIT/ML injection Inject 0.35 mLs (35 Units total) into the skin at bedtime.      metFORMIN  (GLUCOPHAGE ) 500 MG tablet Take 1 tablet (500 mg total) by mouth 2 (two) times daily with a meal. 180 tablet 0    nicotine  (NICODERM CQ  - DOSED IN MG/24 HR) 7 mg/24hr patch Place 1 patch (7 mg total) onto the skin daily as needed (nicotine  withdrawal).      ondansetron  (ZOFRAN -ODT) 4 MG disintegrating tablet Take 1 tablet (4 mg total) by mouth every 8 (eight) hours as needed for nausea or vomiting.       AIMS:  ,  ,  ,  ,  ,  ,    Musculoskeletal: Strength & Muscle Tone: within normal limits Gait & Station: normal Patient leans: N/A            Psychiatric Specialty Exam:  Presentation  General Appearance:  Disheveled  Eye Contact: Fair  Speech: Clear and Coherent  Speech Volume: Normal  Handedness: Right   Mood and Affect  Mood: Dysphoric  Affect: Congruent   Thought Process  Thought Processes: Coherent  Duration of Psychotic Symptoms:N/A Past Diagnosis of Schizophrenia or Psychoactive disorder: No  Descriptions of Associations:Intact  Orientation:Full (Time, Place and Person)  Thought Content:WDL  Hallucinations:No data recorded Ideas of Reference:None  Suicidal Thoughts:No data recorded Homicidal Thoughts:No data recorded  Sensorium  Memory: Immediate Good; Recent Good  Judgment: Good  Insight: Good   Executive Functions  Concentration: Fair  Attention Span: Fair  Recall: Fair  Fund of Knowledge: Fair  Language: Fair   Psychomotor Activity  Psychomotor Activity:No data recorded  Assets  Assets: Desire for Improvement; Housing   Sleep  Sleep:No data recorded Estimated Sleeping Duration (Last 24 Hours): 9.75-12.75 hours   Physical Exam: Physical Exam HENT:     Head:  Normocephalic.  Pulmonary:     Effort: Pulmonary effort is normal.  Abdominal:     General: Abdomen is flat.  Neurological:     Mental Status: He is alert.    ROS Blood pressure 104/71, pulse 80, temperature (!) 97.2 F (36.2 C), resp. rate 18, height 5' 8 (1.727 m), weight 70.1 kg, SpO2 99%. Body mass index is 23.49 kg/m.  Treatment Plan Summary: Daily contact with patient to assess and evaluate symptoms and progress in treatment  Observation Level/Precautions:  15 minute checks  Laboratory:  UDS  Psychotherapy:    Medications:    Consultations:    Discharge Concerns:    Estimated LOS:  Other:     Assessment and plan patient is a 63 year old male who has significant challenges with depression cocaine abuse homelessness and endorsing suicidal thoughts will look at Wellbutrin  as an option and restart home medication and possibly look at chemical dependency program. Physician Treatment Plan for Primary Diagnosis: MDD (major depressive disorder), recurrent episode, severe (HCC) Long Term Goal(s): Improvement in symptoms so as ready for discharge  Short Term Goals: Ability to identify changes in lifestyle to reduce recurrence of condition will improve  Physician Treatment Plan for Secondary Diagnosis: Principal Problem:   MDD (major depressive disorder), recurrent episode, severe (HCC)  Long Term Goal(s): Improvement in symptoms so as ready for discharge and housing situation resolved  Short Term Goals: Ability to verbalize feelings will improve  I certify that inpatient services furnished can reasonably be expected to improve the patient's condition.    Millie JONELLE Manners, MD 8/16/20257:39 PM

## 2023-10-25 NOTE — [facility] Counselor (Signed)
 Adult Comprehensive Assessment  Patient ID: David Wyatt, male   DOB: 12-Dec-1960, 63 y.o.   MRN: [id number]  Information Source: Information source: Patient  Current Stressors:  Patient states their primary concerns and needs for treatment are:: I don't have anywhere to go. I wanted to end my life. Patient states their goals for this hospitilization and ongoing recovery are:: Pt wants to be stable on his meds prior to discharge Educational / Learning stressors: none reported Employment / Job issues: none reported Family Relationships: none reported Surveyor, quantity / Lack of resources (include bankruptcy): Pt denies Housing / Lack of housing: Pt is currently unhoused. Physical health (include injuries & life threatening diseases): Pt concerned about his blood sugar. Social relationships: none reported Substance abuse: Pt denies Bereavement / Loss: none reported  Living/Environment/Situation:  Living Arrangements: Other (Comment) (Currently homeless) Living conditions (as described by patient or guardian): N/A Who else lives in the home?: N/A How long has patient lived in current situation?: Pt reports he has been unhoused for the past 3 weeks What is atmosphere in current home: Other (Comment)  Family History:  Marital status: Single Does patient have children?: Yes How many children?: 3 How is patient's relationship with their children?: poor. Pt children reside in [place]  and he has not spoken to them in awhile.  Childhood History:  By whom was/is the patient raised?: Both parents Additional childhood history information: Pt reports his childhood was lovely Description of patient's relationship with caregiver when they were a child: good Patient's description of current relationship with people who raised him/her: Both deceased How were you disciplined when you got in trouble as a child/adolescent?: Very good Does patient have siblings?: Yes Number of Siblings: 3 Description  of patient's current relationship with siblings: Pt reports no relationship with either opf his siblings Did patient suffer from severe childhood neglect?: No Has patient ever been sexually abused/assaulted/raped as an adolescent or adult?: No Was the patient ever a victim of a crime or a disaster?: No Witnessed domestic violence?: No Has patient been affected by domestic violence as an adult?: No  Education:  Highest grade of school patient has completed: 12th grade Currently a student?: No Learning disability?: No  Employment/Work Situation:   Employment Situation: On disability Why is Patient on Disability: medical reasons How Long has Patient Been on Disability: 15 years What is the Longest Time Patient has Held a Job?: 30+ years 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 My faith.  Leisure/Recreation:   Do You Have Hobbies?: No  Strengths/Needs:   What is the patient's perception of their strengths?: Doing for others/helping people Patient states they can use these personal strengths during their treatment to contribute to their recovery: Pt says he is going to start helping himself instead of others Patient states these barriers may affect their return to the community: Pt is currently unhoused  Discharge Plan:   Currently receiving community  mental health  services: No Patient states concerns and preferences for aftercare planning are: Pt requesting to engage in mental health therapy after discharge Patient states they will know when they are safe and ready for discharge when: When I have somewhere to go (to live) and when my blood sugar is under control. Does patient have access to transportation?: No Does patient have financial barriers related to discharge medications?: Yes Patient description of barriers related to discharge medications: Pt reports he has no money to get meds Plan for no access to transportation at discharge: Pt has no transportation. CSW will assist with transportation upon discharge Plan for living situation after discharge: Pt living situation is unclear. He is currently unhoused Will patient be returning to same living situation after discharge?: No  Summary/Recommendations:   Patient is a 63 year old male in a long-term relationship from [place], [place] [facility] [place]).   Patient reports he is currently homeless and is unsure of his discharge plan at this time. Patient also concerned regarding the management of his blood sugar. He has a primary diagnosis of Major Depressive Disorder.  Recommendations include: crisis stabilization, therapeutic milieu, encourage group attendance and participation, medication management for detox/mood stabilization and development of comprehensive mental wellness/sobriety plan.  David Wyatt. 10/25/2023

## 2023-10-25 NOTE — Progress Notes (Signed)
   10/25/23 0800  Psych Admission Type (Psych Patients Only)  Admission Status Voluntary  Psychosocial Assessment  Patient Complaints None  Eye Contact Fair  Facial Expression Animated  Affect Appropriate to circumstance  Speech Logical/coherent  Interaction Assertive  Motor Activity Unsteady  Appearance/Hygiene In scrubs  Behavior Characteristics Cooperative  Mood Pleasant  Thought Process  Coherency WDL  Content WDL  Delusions None reported or observed  Perception WDL  Hallucination None reported or observed  Judgment Impaired  Confusion None  Danger to Self  Current suicidal ideation? Denies  Agreement Not to Harm Self Yes  Description of Agreement verbal  Danger to Others  Danger to Others None reported or observed

## 2023-10-25 NOTE — Plan of Care (Signed)
   Problem: Education: Goal: Knowledge of [place] [facility] Education information/materials will improve Outcome: Progressing Goal: Emotional status will improve Outcome: Progressing Goal: Mental status will improve Outcome: Progressing

## 2023-10-25 NOTE — [facility] Suicide Risk Assessment (Signed)
 [facility] Admission Suicide Risk Assessment   Nursing information obtained from:  Patient Demographic factors:  Male, Low socioeconomic status, Living alone, Unemployed Current Mental Status:  NA Loss Factors:  Decline in physical health, Financial problems / change in socioeconomic status Historical Factors:  NA Risk Reduction Factors:  NA  Total Time spent with patient: 45 minutes Principal Problem: MDD (major depressive disorder), recurrent episode, severe (HCC) Diagnosis:  Principal Problem:   MDD (major depressive disorder), recurrent episode, severe (HCC)  Subjective Data:  patient is a 63 year old who has significant challenges with homelessness cocaine abuse comes in with suicidal thoughts wants to go to a drug rehab program.  Continued Clinical Symptoms:  Alcohol Use Disorder Identification Test Final Score (AUDIT): 0 The Alcohol Use Disorders Identification Test, Guidelines for Use in Primary Care, Second Edition.  World Science writer [facility]). Score between 0-7:  no or low risk or alcohol related problems. Score between 8-15:  moderate risk of alcohol related problems. Score between 16-19:  high risk of alcohol related problems. Score 20 or above:  warrants further diagnostic evaluation for alcohol dependence and treatment.   CLINICAL FACTORS:   Depression:   Anhedonia Medical Diagnoses and Treatments/Surgeries   Musculoskeletal: Strength & Muscle Tone: within normal limits Gait & Station: normal Patient leans: N/A  Psychiatric Specialty Exam:  Presentation  General Appearance:  Disheveled  Eye Contact: Fair  Speech: Clear and Coherent  Speech Volume: Normal  Handedness: Right   Mood and Affect  Mood: Dysphoric  Affect: Congruent   Thought Process  Thought Processes: Coherent  Descriptions of Associations:Intact  Orientation:Full (Time, Place and Person)  Thought Content:WDL  History of Schizophrenia/Schizoaffective  disorder:No  Duration of Psychotic Symptoms:No data recorded Hallucinations:No data recorded Ideas of Reference:None  Suicidal Thoughts:No data recorded Homicidal Thoughts:No data recorded  Sensorium  Memory: Immediate Good; Recent Good  Judgment: Good  Insight: Good   Executive Functions  Concentration: Fair  Attention Span: Fair  Recall: Fair  Fund of Knowledge: Fair  Language: Fair   Psychomotor Activity  Psychomotor Activity:No data recorded  Assets  Assets: Desire for Improvement; Housing   Sleep  Sleep:No data recorded   Physical Exam: Physical Exam ROS Blood pressure 104/71, pulse 80, temperature (!) 97.2 F (36.2 C), resp. rate 18, height 5' 8 (1.727 m), weight 70.1 kg, SpO2 99%. Body mass index is 23.49 kg/m.   COGNITIVE FEATURES THAT CONTRIBUTE TO RISK:  Closed-mindedness    SUICIDE RISK:   Minimal: No identifiable suicidal ideation.  Patients presenting with no risk factors but with morbid ruminations; may be classified as minimal risk based on the severity of the depressive symptoms  PLAN OF CARE:   Patient will be provided with individual group and milieu therapy and we will look at Wellbutrin  as an option for depression and cocaine dependence.  I certify that inpatient services furnished can reasonably be expected to improve the patient's condition.   Millie JONELLE Manners, MD 10/25/2023, 7:35 PM

## 2023-10-25 NOTE — Group Note (Signed)
 Date:  10/25/2023 Time:  10:54 PM  Group Topic/Focus:  Dimensions of Wellness:   The focus of this group is to introduce the topic of wellness and discuss the role each dimension of wellness plays in total health. Emotional Education:   The focus of this group is to discuss what feelings/emotions are, and how they are experienced. Managing Feelings:   The focus of this group is to identify what feelings patients have difficulty handling and develop a plan to handle them in a healthier way upon discharge.    Participation Level:  Active  Participation Quality:  Appropriate and Attentive  Affect:  Appropriate  Cognitive:  Alert and Appropriate  Insight: Appropriate and Good  Engagement in Group:  Engaged and Supportive  Modes of Intervention:  Discussion, Socialization, and Support  Additional Comments:  Pt did attend group. Will continue to motivate pt to keep attending group with peers.  Brad GORMAN Ryder 10/25/2023, 10:54 PM

## 2023-10-25 NOTE — Plan of Care (Signed)
  Problem: Education: Goal: Mental status will improve Outcome: Progressing   Problem: Coping: Goal: Ability to verbalize frustrations and anger appropriately will improve Outcome: Progressing   Problem: Coping: Goal: Ability to demonstrate self-control will improve Outcome: Progressing

## 2023-10-26 LAB — GLUCOSE, CAPILLARY
Glucose-Capillary: 245 mg/dL — ABNORMAL HIGH (ref 70–99)
Glucose-Capillary: 227 mg/dL — ABNORMAL HIGH (ref 70–99)
Glucose-Capillary: 277 mg/dL — ABNORMAL HIGH (ref 70–99)
Glucose-Capillary: 222 mg/dL — ABNORMAL HIGH (ref 70–99)

## 2023-10-26 NOTE — Plan of Care (Signed)
  Problem: Safety: Goal: Periods of time without injury will increase Outcome: Progressing   Problem: Education: Goal: Ability to state activities that reduce stress will improve Outcome: Not Progressing

## 2023-10-26 NOTE — Progress Notes (Signed)
   10/26/23 1200  Psych Admission Type (Psych Patients Only)  Admission Status Voluntary  Psychosocial Assessment  Patient Complaints Irritability  Eye Contact Fair  Facial Expression Animated  Affect Anxious  Speech Logical/coherent  Interaction Assertive  Motor Activity Unsteady  Appearance/Hygiene In scrubs  Behavior Characteristics Appropriate to situation  Mood Labile  Thought Process  Coherency WDL  Content Preoccupation  Delusions None reported or observed  Perception WDL  Hallucination None reported or observed  Judgment Impaired  Confusion Mild  Danger to Self  Current suicidal ideation? Denies  Agreement Not to Harm Self Yes  Description of Agreement verbal  Danger to Others  Danger to Others None reported or observed

## 2023-10-26 NOTE — Progress Notes (Signed)
   10/25/23 2300  Psych Admission Type (Psych Patients Only)  Admission Status Voluntary  Psychosocial Assessment  Patient Complaints None  Eye Contact Fair  Facial Expression Animated  Affect Appropriate to circumstance  Speech Logical/coherent  Interaction Assertive  Motor Activity Unsteady  Appearance/Hygiene In scrubs;Unremarkable  Behavior Characteristics Cooperative  Mood Pleasant  Thought Process  Coherency WDL  Content WDL  Delusions None reported or observed  Perception WDL  Hallucination None reported or observed  Judgment Impaired  Confusion None  Danger to Self  Current suicidal ideation? Denies  Agreement Not to Harm Self Yes  Description of Agreement Verbal  Danger to Others  Danger to Others None reported or observed   Pt alert & able to make needs known.  Received all medications as scheduled, c/o BS feeling high, requesting glucose check,  Informed by staff that veteran ate 2 ice creams and other snacks during group.  BS 405, glargine administered BS rechecked for 369, NP made aware via secure chat with no intervention/response noted.  Reeducated pt on portion control and proper diet with diabetes.  No s/s hyperglycemia noted, resting in bed with respirations even & unlabored throughout the night.

## 2023-10-26 NOTE — Group Note (Signed)
 Date:  10/26/2023 Time:  1:02 PM  Group Topic/Focus:  Making Healthy Choices:   The focus of this group is to help patients identify negative/unhealthy choices they were using prior to admission and identify positive/healthier coping strategies to replace them upon discharge.    Participation Level:  Active  Participation Quality:  Attentive  Affect:  Appropriate  Cognitive:  Appropriate  Insight: Good  Engagement in Group:  Limited  Modes of Intervention:  Discussion  Additional Comments:  N/A  Harlene LITTIE Gavel 10/26/2023, 1:02 PM

## 2023-10-26 NOTE — Plan of Care (Signed)
  Problem: Education: Goal: Knowledge of [place] [facility] Education information/materials will improve Outcome: Progressing   Problem: Metabolic: Goal: Ability to maintain appropriate glucose levels will improve Outcome: Progressing   Problem: Nutritional: Goal: Maintenance of adequate nutrition will improve Outcome: Progressing

## 2023-10-26 NOTE — Group Note (Signed)
 Date:  10/26/2023 Time:  9:43 PM  Group Topic/Focus:  Wrap-Up Group:   The focus of this group is to help patients review their daily goal of treatment and discuss progress on daily workbooks.    Participation Level:  Active  Participation Quality:  Appropriate  Affect:  Appropriate  Cognitive:  Appropriate  Insight: Appropriate  Engagement in Group:  Engaged  Modes of Intervention:  Discussion  Additional Comments:    David Wyatt CHRISTELLA Bunker 10/26/2023, 9:43 PM

## 2023-10-27 DIAGNOSIS — F333 Major depressive disorder, recurrent, severe with psychotic symptoms: Secondary | ICD-10-CM

## 2023-10-27 LAB — GLUCOSE, CAPILLARY
Glucose-Capillary: 419 mg/dL — ABNORMAL HIGH (ref 70–99)
Glucose-Capillary: 42 mg/dL — CL (ref 70–99)
Glucose-Capillary: 285 mg/dL — ABNORMAL HIGH (ref 70–99)
Glucose-Capillary: 312 mg/dL — ABNORMAL HIGH (ref 70–99)

## 2023-10-27 MED ORDER — INSULIN ASPART 100 UNIT/ML IJ SOLN
0.0000 [IU] | Freq: Every day | INTRAMUSCULAR | Status: DC
  Administered 2023-10-28: 2 [IU] via SUBCUTANEOUS
  Administered 2023-10-29: 3 [IU] via SUBCUTANEOUS
  Administered 2023-10-30: 2 [IU] via SUBCUTANEOUS
  Filled 2023-10-27 (×3): qty 1

## 2023-10-27 MED ORDER — INSULIN ASPART 100 UNIT/ML IJ SOLN
0.0000 [IU] | Freq: Three times a day (TID) | INTRAMUSCULAR | Status: DC
  Administered 2023-10-27: 5 [IU] via SUBCUTANEOUS
  Administered 2023-10-27: 7 [IU] via SUBCUTANEOUS
  Administered 2023-10-28: 3 [IU] via SUBCUTANEOUS
  Administered 2023-10-28: 2 [IU] via SUBCUTANEOUS
  Administered 2023-10-28 – 2023-10-29 (×2): 3 [IU] via SUBCUTANEOUS
  Administered 2023-10-29: 7 [IU] via SUBCUTANEOUS
  Administered 2023-10-29: 4 [IU] via SUBCUTANEOUS
  Administered 2023-10-30: 2 [IU] via SUBCUTANEOUS
  Administered 2023-10-30: 5 [IU] via SUBCUTANEOUS
  Administered 2023-10-30: 3 [IU] via SUBCUTANEOUS
  Administered 2023-10-31 (×2): 2 [IU] via SUBCUTANEOUS
  Administered 2023-10-31 – 2023-11-01 (×2): 3 [IU] via SUBCUTANEOUS
  Filled 2023-10-27 (×8): qty 1

## 2023-10-27 MED ORDER — INSULIN ASPART 100 UNIT/ML IJ SOLN
4.0000 [IU] | Freq: Three times a day (TID) | INTRAMUSCULAR | Status: DC
  Administered 2023-10-27 – 2023-11-01 (×16): 4 [IU] via SUBCUTANEOUS
  Filled 2023-10-27 (×9): qty 1

## 2023-10-27 MED ORDER — DOXEPIN HCL 25 MG PO CAPS
25.0000 mg | ORAL_CAPSULE | Freq: Every day | ORAL | Status: DC
  Administered 2023-10-27 – 2023-10-31 (×5): 25 mg via ORAL
  Filled 2023-10-27 (×5): qty 1

## 2023-10-27 NOTE — Inpatient Diabetes Management (Signed)
 Inpatient Diabetes Program Recommendations  AACE/ADA: New Consensus Statement on Inpatient Glycemic Control (2015)  Target Ranges:  Prepandial:   less than 140 mg/dL      Peak postprandial:   less than 180 mg/dL (1-2 hours)      Critically ill patients:  140 - 180 mg/dL   Lab Results  Component Value Date   GLUCAP 419 (H) 10/27/2023   HGBA1C 13.9 (H) 09/27/2023    Review of Glycemic Control  Diabetes history: DM2 Outpatient Diabetes medications: Novolog  10 units TID, Lantus  24 units at bedtime (not taking regularly due to no where to store insulin ),  metformin  1000 mg BID Current orders for Inpatient glycemic control: Semglee  30 units daily, Novolog  0-15 units tid Metformin  500 mg bid  Inpatient Diabetes Program Recommendations:   Please consider: -Add Novolog  4 units TID with meals if eating > 50%.  -Change Novolog  correction to 0-9 units tid, 0-5 units hs   Thank you, Daesha Insco E. Laporchia Nakajima, RN, MSN, CDCES  Diabetes Coordinator Inpatient Glycemic Control Team Team Pager [phone] (8am-5pm) 10/27/2023 10:19 AM

## 2023-10-27 NOTE — Progress Notes (Addendum)
 [facility] MD Progress Note  10/27/2023 4:19 PM Jasani Dolney  MRN:  [id number]  Siddh Vandeventer is a 63 y.o. male admitted: Presented to the ED for 10/22/2023  5:21 PM for diabetic ketoacidoses. He carries the psychiatric diagnoses of depression, stimulant use disorder (cocaine), tobacco use disorder, and has a past medical history of  type 2 diabetes with long term use of insulin  and neuropathy, hypertension, hyperlipidemia, arthritis, and chronic back pain. His current presentation of suicidal ideation is most consistent with major depressive disorder. Subjective:  Chart reviewed, case discussed in multidisciplinary meeting, patient seen during rounds.  Patient met with the treatment team today.  Patient reports that he was having suicidal ideation which led up to the ED visit and admission.  He continues to repeat not feeling good.  He reports that he stayed up for 5 days abusing crack cocaine and he feels scared that his brain has been damaged.  He also reports having problems with his hearing but during about the interview patient has been responding pretty appropriately with no hearing impairment noted.  Patient also expressed his anxiety about memory loss.  He reports he smoked crack cocaine 10 days ago.  He denies current suicidal ideation.  When asked about hallucinations he started looking around the room and reports that he is seeing some snakes last night.  Per nursing report patient is taking medications with no reported side effects.  During the interview and observation in the dayroom patient is not responding to any internal stimuli   Sleep: Negative  Appetite:  Negative  Past Psychiatric History: see h&P Family History:  Family History  Problem Relation Age of Onset   Diabetes Mother    Kidney disease Mother    Hyperlipidemia Mother    Hyperlipidemia Father    Diabetes Father    Social History:  Social History   Substance and Sexual Activity  Alcohol Use No   Alcohol/week: 0.0  standard drinks of alcohol     Social History   Substance and Sexual Activity  Drug Use Not Currently   Types: Crack cocaine   Comment: last used in July 2025    Social History   Socioeconomic History   Marital status: Divorced    Spouse name: Not on file   Number of children: 3   Years of education: 12   Highest education level: Not on file  Occupational History    Comment: disabled  Tobacco Use   Smoking status: Every Day    Current packs/day: 0.25    Average packs/day: 0.3 packs/day for 30.0 years (7.5 ttl pk-yrs)    Types: Cigarettes   Smokeless tobacco: Former    Quit date: 08/15/2015   Tobacco comments:    04/29/16 2  cigs daily, 10/27/17 sometimes < .25 PPD  Vaping Use   Vaping status: Never Used  Substance and Sexual Activity   Alcohol use: No    Alcohol/week: 0.0 standard drinks of alcohol   Drug use: Not Currently    Types: Crack cocaine    Comment: last used in July 2025   Sexual activity: Not on file  Other Topics Concern   Not on file  Social History Narrative   04/29/17   Lives in shelter   Caffeine- a lot of  tea, coffee   Social Drivers of Corporate investment banker Strain: Not on file  Food Insecurity: Food Insecurity Present (10/24/2023)   Hunger Vital Sign    Worried About Running Out of Food in the  Last Year: Sometimes true    Ran Out of Food in the Last Year: Sometimes true  Transportation Needs: Unmet Transportation Needs (10/24/2023)   PRAPARE - Administrator, Civil Service (Medical): Yes    Lack of Transportation (Non-Medical): Yes  Physical Activity: Not on file  Stress: Not on file  Social Connections: Unknown (10/24/2023)   Social Connection and Isolation Panel    Frequency of Communication with Friends and Family: Once a week    Frequency of Social Gatherings with Friends and Family: Never    Attends Religious Services: Never    Database administrator or Organizations: No    Attends Banker Meetings: Never     Marital Status: Patient unable to answer   Past Medical History:  Past Medical History:  Diagnosis Date   Angioedema of lips 07/28/2012   left upper (07/29/2012)   Arthritis    hands and back   Chronic back pain    Cocaine abuse (HCC)    Diabetic neuropathy (HCC)    High cholesterol    Hypertension    Neuropathy    Type II diabetes mellitus (HCC)     Past Surgical History:  Procedure Laterality Date   BACK SURGERY     CYSTOSCOPY W/ URETERAL STENT PLACEMENT Right 02/23/2021   Procedure: CYSTOSCOPY WITH RETROGRADE PYELOGRAM/URETERAL STENT PLACEMENT;  Surgeon: Selma Donnice SAUNDERS, MD;  Location: [facility];  Service: Urology;  Laterality: Right;   CYSTOSCOPY/URETEROSCOPY/HOLMIUM LASER/STENT PLACEMENT Right 12/05/2022   Procedure: CYSTOSCOPY, RIGHT URETEROSCOPY, AND RIGHT URETERAL STENT PLACEMENT;  Surgeon: Shane Steffan BROCKS, MD;  Location: [facility];  Service: Urology;  Laterality: Right;  90 MINUTES   HERNIA REPAIR Right 04/01/2012   I & D EXTREMITY Left 12/13/2012   Procedure: IRRIGATION AND DEBRIDEMENT LEFT THUMB;  Surgeon: Franky SAUNDERS Curia, MD;  Location: [facility] OR;  Service: Orthopedics;  Laterality: Left;   INCISION AND DRAINAGE OF WOUND     boil on back/notes 07/14/2008 (07/29/2012)   INGUINAL HERNIA REPAIR  04/01/2012   Procedure: HERNIA REPAIR INGUINAL ADULT;  Surgeon: Sherlean JINNY Laughter, MD;  Location: [facility] OR;  Service: General;  Laterality: Right;   INGUINAL HERNIA REPAIR Left 09/06/2016   Procedure: OPEN REPAIR LEFT INGUINAL HERNIA;  Surgeon: Tanda Locus, MD;  Location: [facility] OR;  Service: General;  Laterality: Left;   INSERTION OF MESH Left 09/06/2016   Procedure: INSERTION OF MESH;  Surgeon: Tanda Locus, MD;  Location: [facility] OR;  Service: General;  Laterality: Left;   TONSILLECTOMY      Current Medications: Current Facility-Administered Medications  Medication Dose Route Frequency Provider Last Rate Last Admin   acetaminophen  (TYLENOL ) tablet 650 mg  650 mg Oral Q6H PRN Wilkie Majel RAMAN,  FNP   650 mg at 10/27/23 1003   alum & mag hydroxide-simeth (MAALOX/MYLANTA) 200-200-20 MG/5ML suspension 30 mL  30 mL Oral Q4H PRN Starkes-Perry, Majel RAMAN, FNP       amLODipine  (NORVASC ) tablet 10 mg  10 mg Oral Daily Wilkie Majel RAMAN, FNP   10 mg at 10/27/23 1003   atorvastatin  (LIPITOR) tablet 40 mg  40 mg Oral Daily Starkes-Perry, Takia S, FNP   40 mg at 10/27/23 1003   buPROPion  (WELLBUTRIN  XL) 24 hr tablet 150 mg  150 mg Oral Daily Madaram, Kondal R, MD   150 mg at 10/27/23 1003   doxepin  (SINEQUAN ) capsule 25 mg  25 mg Oral QHS Raiya Stainback, MD       DULoxetine  (CYMBALTA ) DR capsule  60 mg  60 mg Oral Daily Starkes-Perry, Takia S, FNP   60 mg at 10/27/23 1002   gabapentin  (NEURONTIN ) capsule 300 mg  300 mg Oral TID Starkes-Perry, Takia S, FNP   300 mg at 10/27/23 1003   hydrOXYzine  (ATARAX ) tablet 10 mg  10 mg Oral TID PRN Starkes-Perry, Takia S, FNP   10 mg at 10/27/23 1003   insulin  aspart (novoLOG ) injection 0-5 Units  0-5 Units Subcutaneous QHS Mushka Laconte, MD       insulin  aspart (novoLOG ) injection 0-9 Units  0-9 Units Subcutaneous TID WC Abbagail Scaff, MD   5 Units at 10/27/23 1231   insulin  aspart (novoLOG ) injection 4 Units  4 Units Subcutaneous TID WC Tnya Ades, MD   4 Units at 10/27/23 1230   insulin  glargine-yfgn (SEMGLEE ) injection 30 Units  30 Units Subcutaneous QHS Taurean Ju, MD   30 Units at 10/26/23 2114   magnesium  hydroxide (MILK OF MAGNESIA) suspension 30 mL  30 mL Oral Daily PRN Wilkie Majel RAMAN, FNP       metFORMIN  (GLUCOPHAGE ) tablet 500 mg  500 mg Oral BID WC Madaram, Kondal R, MD   500 mg at 10/27/23 0757   nicotine  (NICODERM CQ  - dosed in mg/24 hours) patch 14 mg  14 mg Transdermal Daily Evalie Hargraves, MD   14 mg at 10/25/23 1015   OLANZapine  (ZYPREXA ) injection 5 mg  5 mg Intramuscular TID PRN Starkes-Perry, Takia S, FNP       OLANZapine  zydis (ZYPREXA ) disintegrating tablet 5 mg  5 mg Oral TID PRN Starkes-Perry, Takia S, FNP        traZODone  (DESYREL ) tablet 50 mg  50 mg Oral QHS PRN Bobbitt, Shalon E, NP   50 mg at 10/26/23 2115    Lab Results:  Results for orders placed or performed during the hospital encounter of 10/24/23 (from the past 48 hours)  Glucose, capillary     Status: Abnormal   Collection Time: 10/25/23  4:45 PM  Result Value Ref Range   Glucose-Capillary 112 (H) 70 - 99 mg/dL    Comment: Glucose reference range applies only to samples taken after fasting for at least 8 hours.  Glucose, capillary     Status: Abnormal   Collection Time: 10/25/23  9:10 PM  Result Value Ref Range   Glucose-Capillary 369 (H) 70 - 99 mg/dL    Comment: Glucose reference range applies only to samples taken after fasting for at least 8 hours.  Glucose, capillary     Status: Abnormal   Collection Time: 10/25/23 10:19 PM  Result Value Ref Range   Glucose-Capillary 405 (H) 70 - 99 mg/dL    Comment: Glucose reference range applies only to samples taken after fasting for at least 8 hours.  Glucose, capillary     Status: Abnormal   Collection Time: 10/25/23 11:28 PM  Result Value Ref Range   Glucose-Capillary 369 (H) 70 - 99 mg/dL    Comment: Glucose reference range applies only to samples taken after fasting for at least 8 hours.  Glucose, capillary     Status: Abnormal   Collection Time: 10/26/23  7:34 AM  Result Value Ref Range   Glucose-Capillary 245 (H) 70 - 99 mg/dL    Comment: Glucose reference range applies only to samples taken after fasting for at least 8 hours.  Glucose, capillary     Status: Abnormal   Collection Time: 10/26/23 11:34 AM  Result Value Ref Range   Glucose-Capillary 227 (H) 70 -  99 mg/dL    Comment: Glucose reference range applies only to samples taken after fasting for at least 8 hours.  Glucose, capillary     Status: Abnormal   Collection Time: 10/26/23  4:28 PM  Result Value Ref Range   Glucose-Capillary 277 (H) 70 - 99 mg/dL    Comment: Glucose reference range applies only to samples taken  after fasting for at least 8 hours.  Glucose, capillary     Status: Abnormal   Collection Time: 10/26/23  7:52 PM  Result Value Ref Range   Glucose-Capillary 222 (H) 70 - 99 mg/dL    Comment: Glucose reference range applies only to samples taken after fasting for at least 8 hours.   Comment 1 Notify RN   Glucose, capillary     Status: Abnormal   Collection Time: 10/27/23  7:27 AM  Result Value Ref Range   Glucose-Capillary 419 (H) 70 - 99 mg/dL    Comment: Glucose reference range applies only to samples taken after fasting for at least 8 hours.  Glucose, capillary     Status: Abnormal   Collection Time: 10/27/23 11:29 AM  Result Value Ref Range   Glucose-Capillary 42 (LL) 70 - 99 mg/dL    Comment: Glucose reference range applies only to samples taken after fasting for at least 8 hours.   Comment 1 Notify RN   Glucose, capillary     Status: Abnormal   Collection Time: 10/27/23 12:27 PM  Result Value Ref Range   Glucose-Capillary 285 (H) 70 - 99 mg/dL    Comment: Glucose reference range applies only to samples taken after fasting for at least 8 hours.    Blood Alcohol level:  Lab Results  Component Value Date   [facility] <15 10/22/2023   ETH <15 07/15/2023    Metabolic Disorder Labs: Lab Results  Component Value Date   HGBA1C 13.9 (H) 09/27/2023   MPG 352.23 09/27/2023   MPG 191.51 05/23/2023   Lab Results  Component Value Date   PROLACTIN 6.9 06/19/2022   Lab Results  Component Value Date   CHOL 197 10/23/2023   TRIG 113 10/23/2023   HDL 62 10/23/2023   CHOLHDL 3.2 10/23/2023   VLDL 23 10/23/2023   LDLCALC 112 (H) 10/23/2023   LDLCALC 89 05/18/2023    Physical Findings: AIMS:  , ,  ,  ,    CIWA:    COWS:      Psychiatric Specialty Exam:  Presentation  General Appearance:  Disheveled  Eye Contact: Fair  Speech: Clear and Coherent  Speech Volume: Normal    Mood and Affect  Mood: Dysphoric  Affect: Congruent   Thought Process  Thought  Processes: Coherent  Descriptions of Associations:Intact  Orientation:Full (Time, Place and Person)  Thought Content:WDL  Hallucinations: Denies Ideas of Reference:None  Suicidal Thoughts: Denies Homicidal Thoughts: Denies  Sensorium  Memory: Immediate Good; Recent Good  Judgment: Good  Insight: Good   Executive Functions  Concentration: Fair  Attention Span: Fair  Recall: Fair  Fund of Knowledge: Fair  Language: Fair   Psychomotor Activity  Psychomotor Activity:No data recorded Musculoskeletal: Strength & Muscle Tone: within normal limits Gait & Station: unsteady Assets  Assets: Desire for Improvement; Housing    Physical Exam: Physical Exam Vitals and nursing note reviewed.    ROS Blood pressure 107/77, pulse 83, temperature 97.7 F (36.5 C), resp. rate 16, height 5' 8 (1.727 m), weight 70.1 kg, SpO2 100%. Body mass index is 23.49 kg/m.  Diagnosis: Principal  Problem:   MDD (major depressive disorder), recurrent episode, severe (HCC) Stimulant use disorder severe  PLAN: Safety and Monitoring:  -- Voluntary admission to inpatient psychiatric unit for safety, stabilization and treatment  -- Daily contact with patient to assess and evaluate symptoms and progress in treatment  -- Patient's case to be discussed in multi-disciplinary team meeting  -- Observation Level : q15 minute checks  -- Vital signs:  q12 hours  -- Precautions: suicide, elopement, and assault -- Encouraged patient to participate in unit milieu and in scheduled group therapies  2. Psychiatric Diagnoses and Treatment:    Wellbutrin  XL 150 mg  Cymbalta  DR 60 mg daily     3. Medical Issues Being Addressed:   No urgent medical needs noted  4. Discharge Planning:   -- Social work and case management to assist with discharge planning and identification of hospital follow-up needs prior to discharge  -- Estimated LOS: 3-4 days  Kamesha Herne, MD 10/27/2023, 4:19 PM

## 2023-10-27 NOTE — BHH Counselor (Signed)
 CSW received successful return fax for [facility], MSW, [facility] 10/27/2023 3:52 PM

## 2023-10-27 NOTE — B[facility] Counselor (Signed)
 CSW sent referral for pt to [place] (F: [phone])   Lum Croft, MSW, [facility] 10/27/2023 3:17 PM

## 2023-10-27 NOTE — Progress Notes (Signed)
   10/26/23 2100  Psych Admission Type (Psych Patients Only)  Admission Status Voluntary  Psychosocial Assessment  Patient Complaints None  Eye Contact Fair  Facial Expression Animated  Affect Appropriate to circumstance  Speech Logical/coherent  Interaction Assertive  Motor Activity Unsteady (walker)  Appearance/Hygiene In scrubs  Behavior Characteristics Appropriate to situation  Mood Labile  Thought Process  Coherency WDL  Content WDL  Delusions None reported or observed  Perception WDL  Hallucination None reported or observed  Judgment Impaired  Confusion Mild  Danger to Self  Current suicidal ideation? Denies  Agreement Not to Harm Self Yes  Description of Agreement verbal  Danger to Others  Danger to Others None reported or observed

## 2023-10-27 NOTE — BH IP Treatment Plan (Signed)
 Interdisciplinary Treatment and Diagnostic Plan Update  10/27/2023 Time of Session: 10:44 AM  David Wyatt MRN: [id number]  Principal Diagnosis: MDD (major depressive disorder), recurrent episode, severe (HCC)  Secondary Diagnoses: Principal Problem:   MDD (major depressive disorder), recurrent episode, severe (HCC)   Current Medications:  Current Facility-Administered Medications  Medication Dose Route Frequency Provider Last Rate Last Admin   acetaminophen  (TYLENOL ) tablet 650 mg  650 mg Oral Q6H PRN Starkes-Perry, Takia S, FNP   650 mg at 10/27/23 1003   alum & mag hydroxide-simeth (MAALOX/MYLANTA) 200-200-20 MG/5ML suspension 30 mL  30 mL Oral Q4H PRN Wilkie Majel RAMAN, FNP       amLODipine  (NORVASC ) tablet 10 mg  10 mg Oral Daily Wilkie Majel RAMAN, FNP   10 mg at 10/27/23 1003   atorvastatin  (LIPITOR) tablet 40 mg  40 mg Oral Daily Wilkie Majel RAMAN, FNP   40 mg at 10/27/23 1003   buPROPion  (WELLBUTRIN  XL) 24 hr tablet 150 mg  150 mg Oral Daily Madaram, Kondal R, MD   150 mg at 10/27/23 1003   doxepin  (SINEQUAN ) capsule 10 mg  10 mg Oral QHS PRN Wilkie Majel RAMAN, FNP       DULoxetine  (CYMBALTA ) DR capsule 60 mg  60 mg Oral Daily Wilkie Majel RAMAN, FNP   60 mg at 10/27/23 1002   gabapentin  (NEURONTIN ) capsule 300 mg  300 mg Oral TID Starkes-Perry, Takia S, FNP   300 mg at 10/27/23 1003   hydrOXYzine  (ATARAX ) tablet 10 mg  10 mg Oral TID PRN Starkes-Perry, Takia S, FNP   10 mg at 10/27/23 1003   insulin  aspart (novoLOG ) injection 0-5 Units  0-5 Units Subcutaneous QHS Jadapalle, Sree, MD       insulin  aspart (novoLOG ) injection 0-9 Units  0-9 Units Subcutaneous TID WC Jadapalle, Sree, MD       insulin  aspart (novoLOG ) injection 4 Units  4 Units Subcutaneous TID WC Jadapalle, Sree, MD       insulin  glargine-yfgn (SEMGLEE ) injection 30 Units  30 Units Subcutaneous QHS Jadapalle, Sree, MD   30 Units at 10/26/23 2114   magnesium  hydroxide (MILK OF MAGNESIA)  suspension 30 mL  30 mL Oral Daily PRN Wilkie Majel RAMAN, FNP       metFORMIN  (GLUCOPHAGE ) tablet 500 mg  500 mg Oral BID WC Madaram, Kondal R, MD   500 mg at 10/27/23 0757   nicotine  (NICODERM CQ  - dosed in mg/24 hours) patch 14 mg  14 mg Transdermal Daily Jadapalle, Sree, MD   14 mg at 10/25/23 1015   OLANZapine  (ZYPREXA ) injection 5 mg  5 mg Intramuscular TID PRN Wilkie Majel RAMAN, FNP       OLANZapine  zydis (ZYPREXA ) disintegrating tablet 5 mg  5 mg Oral TID PRN Wilkie Majel RAMAN, FNP       traZODone  (DESYREL ) tablet 50 mg  50 mg Oral QHS PRN Bobbitt, Shalon E, NP   50 mg at 10/26/23 2115   PTA Medications: Medications Prior to Admission  Medication Sig Dispense Refill Last Dose/Taking   Accu-Chek Softclix Lancets lancets Use to check blood sugar three times daily as directed 100 each 1    amLODipine  (NORVASC ) 10 MG tablet Take 1 tablet (10 mg total) by mouth daily. 90 tablet 0    atorvastatin  (LIPITOR) 40 MG tablet Take 1 tablet (40 mg total) by mouth daily. 90 tablet 0    Blood Glucose Monitoring Suppl (ACCU-CHEK GUIDE) w/Device KIT Use to test blood sugar 3 times daily 1  kit 0    doxepin  (SINEQUAN ) 10 MG capsule Take 1 capsule (10 mg total) by mouth at bedtime as needed (Sleep).      DULoxetine  (CYMBALTA ) 60 MG capsule Take 1 capsule (60 mg total) by mouth daily.      gabapentin  (NEURONTIN ) 300 MG capsule Take 1 capsule (300 mg total) by mouth 3 (three) times daily. 270 capsule 0    glucose blood (ACCU-CHEK GUIDE TEST) test strip Use as instructed to check blood sugar three times daily 100 each 1    hydrOXYzine  (ATARAX ) 10 MG tablet Take 1 tablet (10 mg total) by mouth 3 (three) times daily as needed for anxiety.      insulin  aspart (NOVOLOG ) 100 UNIT/ML injection Inject 0-9 Units into the skin 3 (three) times daily with meals.      insulin  glargine-yfgn (SEMGLEE ) 100 UNIT/ML injection Inject 0.35 mLs (35 Units total) into the skin at bedtime.      metFORMIN  (GLUCOPHAGE )  500 MG tablet Take 1 tablet (500 mg total) by mouth 2 (two) times daily with a meal. 180 tablet 0    nicotine  (NICODERM CQ  - DOSED IN MG/24 HR) 7 mg/24hr patch Place 1 patch (7 mg total) onto the skin daily as needed (nicotine  withdrawal).      ondansetron  (ZOFRAN -ODT) 4 MG disintegrating tablet Take 1 tablet (4 mg total) by mouth every 8 (eight) hours as needed for nausea or vomiting.       Patient Stressors:    Patient Strengths: Ability for insight  Motivation for treatment/growth   Treatment Modalities: Medication Management, Group therapy, Case management,  1 to 1 session with clinician, Psychoeducation, Recreational therapy.   Physician Treatment Plan for Primary Diagnosis: MDD (major depressive disorder), recurrent episode, severe (HCC) Long Term Goal(s): Improvement in symptoms so as ready for discharge housing situation resolved   Short Term Goals: Ability to verbalize feelings will improve Ability to identify changes in lifestyle to reduce recurrence of condition will improve  Medication Management: Evaluate patient's response, side effects, and tolerance of medication regimen.  Therapeutic Interventions: 1 to 1 sessions, Unit Group sessions and Medication administration.  Evaluation of Outcomes: Not Progressing  Physician Treatment Plan for Secondary Diagnosis: Principal Problem:   MDD (major depressive disorder), recurrent episode, severe (HCC)  Long Term Goal(s): Improvement in symptoms so as ready for discharge housing situation resolved   Short Term Goals: Ability to verbalize feelings will improve Ability to identify changes in lifestyle to reduce recurrence of condition will improve     Medication Management: Evaluate patient's response, side effects, and tolerance of medication regimen.  Therapeutic Interventions: 1 to 1 sessions, Unit Group sessions and Medication administration.  Evaluation of Outcomes: Not Progressing   RN Treatment Plan for Primary  Diagnosis: MDD (major depressive disorder), recurrent episode, severe (HCC) Long Term Goal(s): Knowledge of disease and therapeutic regimen to maintain health will improve  Short Term Goals: Ability to remain free from injury will improve, Ability to verbalize frustration and anger appropriately will improve, Ability to demonstrate self-control, Ability to participate in decision making will improve, Ability to verbalize feelings will improve, Ability to disclose and discuss suicidal ideas, Ability to identify and develop effective coping behaviors will improve, and Compliance with prescribed medications will improve  Medication Management: RN will administer medications as ordered by provider, will assess and evaluate patient's response and provide education to patient for prescribed medication. RN will report any adverse and/or side effects to prescribing provider.  Therapeutic Interventions: 1 on 1  counseling sessions, Psychoeducation, Medication administration, Evaluate responses to treatment, Monitor vital signs and CBGs as ordered, Perform/monitor CIWA, COWS, AIMS and Fall Risk screenings as ordered, Perform wound care treatments as ordered.  Evaluation of Outcomes: Not Progressing   LCSW Treatment Plan for Primary Diagnosis: MDD (major depressive disorder), recurrent episode, severe (HCC) Long Term Goal(s): Safe transition to appropriate next level of care at discharge, Engage patient in therapeutic group addressing interpersonal concerns.  Short Term Goals: Engage patient in aftercare planning with referrals and resources, Increase social support, Increase ability to appropriately verbalize feelings, Increase emotional regulation, Facilitate acceptance of mental health diagnosis and concerns, Facilitate patient progression through stages of change regarding substance use diagnoses and concerns, Identify triggers associated with mental health/substance abuse issues, and Increase skills for  wellness and recovery  Therapeutic Interventions: Assess for all discharge needs, 1 to 1 time with Social worker, Explore available resources and support systems, Assess for adequacy in community support network, Educate family and significant other(s) on suicide prevention, Complete Psychosocial Assessment, Interpersonal group therapy.  Evaluation of Outcomes: Not Progressing   Progress in Treatment: Attending groups: Yes. and No. Participating in groups: Yes. and No. Taking medication as prescribed: Yes. Toleration medication: Yes. Family/Significant other contact made: No, will contact:  CSW will contact if given permission  Patient understands diagnosis: Yes. Discussing patient identified problems/goals with staff: Yes. Medical problems stabilized or resolved: Yes. Denies suicidal/homicidal ideation: Yes. Issues/concerns per patient self-inventory: No. Other: None   New problem(s) identified: No, Describe:  None identified   New Short Term/Long Term Goal(s): detox, elimination of symptoms of psychosis, medication management for mood stabilization; elimination of SI thoughts; development of comprehensive mental wellness/sobriety plan.   Patient Goals:  I guess go to rehab or whatever  Discharge Plan or Barriers: CSW will assist with appropriate discharge planning   Reason for Continuation of Hospitalization: Depression Medication stabilization Withdrawal symptoms  Estimated Length of Stay: 1 to 7 days    Last 3 [place] Suicide Severity Risk Score: Flowsheet Row Admission (Current) from 10/24/2023 in [facility] ED to Hosp-Admission (Discharged) from 10/22/2023 in [facility] 15M KIDNEY UNIT ED from 10/21/2023 in [facility] Emergency Department at [facility]  C-SSRS RISK CATEGORY High Risk High Risk No Risk    Last PHQ 2/9 Scores:    10/20/2023    3:33 PM 09/17/2022    3:23 PM 07/25/2022   11:16 AM  Depression screen PHQ 2/9   Decreased Interest 1 0 0  Down, Depressed, Hopeless 3 0 0  PHQ - 2 Score 4 0 0  Altered sleeping 3 0 2  Tired, decreased energy 3 0 0  Change in appetite 3 0 0  Feeling bad or failure about yourself  1 0 1  Trouble concentrating 3 0 1  Moving slowly or fidgety/restless 3 0 1  Suicidal thoughts 3 0 1  PHQ-9 Score 23 0 6  Difficult doing work/chores  Not difficult at all     Scribe for Treatment Team: [name], LCSWA 10/27/2023 11:01 AM

## 2023-10-27 NOTE — BHH Counselor (Signed)
 CSW faxed referral to [facility] (F: [phone])  Lum Croft, MSW, [facility] 10/27/2023 3:10 PM

## 2023-10-27 NOTE — Progress Notes (Signed)
   10/27/23 0756  Psych Admission Type (Psych Patients Only)  Admission Status Voluntary  Psychosocial Assessment  Patient Complaints None  Eye Contact Fair  Facial Expression Animated  Affect Irritable;Anxious  Speech Logical/coherent  Interaction Sexually inappropriate;Flirtatious  Motor Activity Unsteady  Appearance/Hygiene In scrubs  Behavior Characteristics Anxious  Mood Irritable  Thought Process  Coherency WDL  Content WDL  Delusions None reported or observed  Perception WDL  Hallucination None reported or observed  Judgment Impaired  Confusion Mild  Danger to Self  Current suicidal ideation? Denies

## 2023-10-27 NOTE — Group Note (Signed)
 Recreation Therapy Group Note   Group Topic:Emotion Expression (Part 2) Group Date: 10/27/2023 Start Time: 1500 End Time: 1600 Facilitators: Celestia Jeoffrey BRAVO, LRT, CTRS Location: Dayroom  Group Description: Painting a Peaceful Place. Patients and LRT discuss what it means to be "at peace", what it feels like physically and mentally. Pts are given a canvas and watercolor paint to use and encouraged to draw their idea of a peaceful place. Pts and LRT discuss how they use this in their daily life post discharge. Pts are encouraged to take their canvas home with them as a reminder to find their peaceful place whenever they are feeling depressed, anxious, etc.    Goal Area(s) Addressed:  Patient will identify what it means to experience a "peaceful" emotion. Patient will identify a new coping skill.  Patient will express their emotions through art. Patients will increase communication by talking with LRT and peers while in group.   Affect/Mood: N/A   Participation Level: Did not attend    Clinical Observations/Individualized Feedback: Patient did not attend group.   Plan: Continue to engage patient in RT group sessions 2-3x/week.   Jeoffrey BRAVO Celestia, LRT, CTRS 10/27/2023 5:29 PM

## 2023-10-27 NOTE — Plan of Care (Signed)
  Problem: Education: Goal: Knowledge of [place] [facility] Education information/materials will improve Outcome: Progressing Goal: Emotional status will improve Outcome: Progressing Goal: Mental status will improve Outcome: Progressing Goal: Verbalization of understanding the information provided will improve Outcome: Progressing   Problem: Activity: Goal: Interest or engagement in activities will improve Outcome: Progressing Goal: Sleeping patterns will improve Outcome: Progressing   Problem: Coping: Goal: Ability to verbalize frustrations and anger appropriately will improve Outcome: Progressing Goal: Ability to demonstrate self-control will improve Outcome: Progressing   Problem: Health Behavior/Discharge Planning: Goal: Identification of resources available to assist in meeting health care needs will improve Outcome: Progressing Goal: Compliance with treatment plan for underlying cause of condition will improve Outcome: Progressing   Problem: Physical Regulation: Goal: Ability to maintain clinical measurements within normal limits will improve Outcome: Progressing   Problem: Safety: Goal: Periods of time without injury will increase Outcome: Progressing   Problem: Education: Goal: Ability to describe self-care measures that may prevent or decrease complications (Diabetes Survival Skills Education) will improve Outcome: Progressing Goal: Individualized Educational Video(s) Outcome: Progressing   Problem: Coping: Goal: Ability to adjust to condition or change in health will improve Outcome: Progressing   Problem: Fluid Volume: Goal: Ability to maintain a balanced intake and output will improve Outcome: Progressing   Problem: Health Behavior/Discharge Planning: Goal: Ability to identify and utilize available resources and services will improve Outcome: Progressing Goal: Ability to manage health-related needs will improve Outcome: Progressing   Problem:  Metabolic: Goal: Ability to maintain appropriate glucose levels will improve Outcome: Progressing   Problem: Nutritional: Goal: Maintenance of adequate nutrition will improve Outcome: Progressing Goal: Progress toward achieving an optimal weight will improve Outcome: Progressing   Problem: Skin Integrity: Goal: Risk for impaired skin integrity will decrease Outcome: Progressing   Problem: Tissue Perfusion: Goal: Adequacy of tissue perfusion will improve Outcome: Progressing   Problem: Coping: Goal: Coping ability will improve Outcome: Progressing   Problem: Education: Goal: Ability to state activities that reduce stress will improve Outcome: Progressing

## 2023-10-27 NOTE — Group Note (Signed)
 Date:  10/27/2023 Time:  6:32 PM  Group Topic/Focus:  Managing Feelings:   The focus of this group is to identify what feelings patients have difficulty handling and develop a plan to handle them in a healthier way upon discharge.    Participation Level:  Active  Participation Quality:  Appropriate  Affect:  Appropriate  Cognitive:  Alert  Insight: Appropriate  Engagement in Group:  Engaged  Modes of Intervention:  Activity  Additional Comments:  N/A  David Wyatt 10/27/2023, 6:32 PM

## 2023-10-28 DIAGNOSIS — F1520 Other stimulant dependence, uncomplicated: Secondary | ICD-10-CM

## 2023-10-28 LAB — GLUCOSE, CAPILLARY
Glucose-Capillary: 225 mg/dL — ABNORMAL HIGH (ref 70–99)
Glucose-Capillary: 200 mg/dL — ABNORMAL HIGH (ref 70–99)
Glucose-Capillary: 246 mg/dL — ABNORMAL HIGH (ref 70–99)
Glucose-Capillary: 214 mg/dL — ABNORMAL HIGH (ref 70–99)

## 2023-10-28 MED ORDER — BUPROPION HCL ER (XL) 300 MG PO TB24
300.0000 mg | ORAL_TABLET | Freq: Every day | ORAL | Status: DC
  Administered 2023-10-29 – 2023-10-31 (×3): 300 mg via ORAL
  Filled 2023-10-28 (×3): qty 1

## 2023-10-28 NOTE — Plan of Care (Signed)
   Problem: Education: Goal: Knowledge of [facility]att [facility] Education information/materials will improve Outcome: Progressing Goal: Emotional status will improve Outcome: Progressing Goal: Mental status will improve Outcome: Progressing Goal: Verbalization of understanding the information provided will improve Outcome: Progressing   Problem: Activity: Goal: Interest or engagement in activities will improve Outcome: Progressing Goal: Sleeping patterns will improve Outcome: Progressing   Problem: Coping: Goal: Ability to verbalize frustrations and anger appropriately will improve Outcome: Progressing Goal: Ability to demonstrate self-control will improve Outcome: Progressing

## 2023-10-28 NOTE — BHH Group Notes (Signed)
 Spirituality Group   Group Goal: Support / Education around grief and loss    Group Description: Following introductions and group rules, group members engaged in facilitated group dialog and support around topic of loss, with particular support around experiences of loss in their lives. Group members identified types of loss (relationships / self / things) as well as patterns, circumstances, and changes that precipitate loss. Reflection invited on thoughts / feelings around loss, normalized grief responses, and recognized variety in grief experience. Group noted Worden's four tasks of grief in discussion. Group drew on Adlerian / Rogerian, narrative, MI, with [facility]'s group therapy as a primary framework.   Observations: Noelle was present for about half of the group time. It is unclear how much he was engaged with the discussion.  Nadiya Pieratt L. Fredrica, M.Div

## 2023-10-28 NOTE — BHH Counselor (Signed)
 CSW contacted [employer] to check on application status.   They report the person who normally works on those (Ms.Michelle) is out today and will be back tomorrow.   CSW will follow-up tomorrow.   Lum Croft, MSW, [facility] 10/28/2023 10:44 AM

## 2023-10-28 NOTE — BHH Counselor (Signed)
 CSW gave pt number for [employer]. Pt must call and complete pre-screener before CSW can make contact  Lum Croft, MSW, [facility] 10/28/2023 11:00 AM

## 2023-10-28 NOTE — Group Note (Signed)
 Date:  10/28/2023 Time:  12:33 AM  Group Topic/Focus:  Wrap-Up Group:   The focus of this group is to help patients review their daily goal of treatment and discuss progress on daily workbooks.    Participation Level:  Active  Participation Quality:  Appropriate  Affect:  Appropriate  Cognitive:  Alert  Insight: Appropriate  Engagement in Group:  Engaged  Modes of Intervention:  Discussion  Additional Comments:    David Wyatt 10/28/2023, 12:33 AM

## 2023-10-28 NOTE — BHH Counselor (Signed)
 CSW contacted [employer] to make sure that they received application.   [employer] reports application received reports pt needs to call to complete pre-sceener.   CSW provided pt number for [facility], MSW, [facility] 10/28/2023 10:58 AM

## 2023-10-28 NOTE — Group Note (Signed)
 Recreation Therapy Group Note   Group Topic:Animal Assisted Therapy   Group Date: 10/28/2023 Start Time: 1030 End Time: 1100 Facilitators: Celestia Jeoffrey BRAVO, LRT, CTRS Location: Dayroom  Group Description: AAA. Animal-Assisted Activity provides opportunities for motivational, educational, therapeutic and/or recreational benefits to enhance quality of life. Selinda and Rollo visited the unit to interact with patients.   Goal Areas Addressed:  Reduced anxiety and stress Improved mood Increased social interaction Enhanced communication skills Reduced loneliness and isolation Improved emotional regulation  Affect/Mood: N/A   Participation Level: Did not attend    Clinical Observations/Individualized Feedback: Patient did not attend group.   Plan: Continue to engage patient in RT group sessions 2-3x/week.   Jeoffrey BRAVO Celestia, LRT, CTRS 10/28/2023 1:33 PM

## 2023-10-28 NOTE — Progress Notes (Signed)
 Alert and oriented. Flat affect. Visible in dayroom. Interacting with peers and staff. Attends group. CGB 186. Pt said I want ice cream, potato chips and cereal. Pt educated on portion control and proper diet with diabetes. Verbalized understanding. No s/s of hyper/hypoglycemia noted. No behavior issues noted. Denies SI/HI/AVH. No c/o pain/discomfort noted.    10/27/23 2100  Psych Admission Type (Psych Patients Only)  Admission Status Voluntary  Psychosocial Assessment  Patient Complaints None  Eye Contact Fair  Facial Expression Animated  Affect Anxious  Speech Logical/coherent  Interaction Assertive  Motor Activity Unsteady  Appearance/Hygiene In scrubs  Behavior Characteristics Appropriate to situation  Mood Pleasant  Thought Process  Coherency WDL  Content WDL  Delusions None reported or observed  Perception WDL  Hallucination None reported or observed  Judgment Impaired  Confusion Mild  Danger to Self  Current suicidal ideation? Denies

## 2023-10-28 NOTE — Progress Notes (Signed)
   10/28/23 0706  Psych Admission Type (Psych Patients Only)  Admission Status Voluntary  Psychosocial Assessment  Patient Complaints Confusion;Sadness  Eye Contact Fair  Facial Expression Animated  Affect Irritable;Anxious  Speech Logical/coherent  Interaction Sexually inappropriate;Flirtatious  Motor Activity Unsteady  Appearance/Hygiene In scrubs  Behavior Characteristics Cooperative  Mood Pleasant  Thought Process  Coherency WDL  Content WDL  Delusions None reported or observed  Perception WDL  Hallucination None reported or observed  Judgment Impaired  Confusion Mild  Danger to Self  Current suicidal ideation? Denies

## 2023-10-28 NOTE — Progress Notes (Addendum)
 [facility] MD Progress Note  10/28/2023 12:18 PM David Wyatt  MRN:  [id number]  David Wyatt is a 63 y.o. male admitted: Presented to the ED for 10/22/2023  5:21 PM for diabetic ketoacidoses. He carries the psychiatric diagnoses of depression, stimulant use disorder (cocaine), tobacco use disorder, and has a past medical history of  type 2 diabetes with long term use of insulin  and neuropathy, hypertension, hyperlipidemia, arthritis, and chronic back pain. His current presentation of suicidal ideation is most consistent with major depressive disorder. Subjective:  Chart reviewed, case discussed in multidisciplinary meeting, patient seen during rounds.  Patient is noted to be resting in his bed.  He denies having any side effects to medication.  Provider discussed the plan to increase the Wellbutrin  to 300 mg daily.  Patient is encouraged to attend the groups.  Per nursing report patient remains isolating to his room patient denies homicidal ideation but reports chronic suicidal ideation.  He denies auditory/visual hallucinations.  He has fair appetite and sleep  Sleep: Negative  Appetite:  Negative  Past Psychiatric History: see h&P Family History:  Family History  Problem Relation Age of Onset   Diabetes Mother    Kidney disease Mother    Hyperlipidemia Mother    Hyperlipidemia Father    Diabetes Father    Social History:  Social History   Substance and Sexual Activity  Alcohol Use No   Alcohol/week: 0.0 standard drinks of alcohol     Social History   Substance and Sexual Activity  Drug Use Not Currently   Types: Crack cocaine   Comment: last used in July 2025    Social History   Socioeconomic History   Marital status: Divorced    Spouse name: Not on file   Number of children: 3   Years of education: 12   Highest education level: Not on file  Occupational History    Comment: disabled  Tobacco Use   Smoking status: Every Day    Current packs/day: 0.25    Average  packs/day: 0.3 packs/day for 30.0 years (7.5 ttl pk-yrs)    Types: Cigarettes   Smokeless tobacco: Former    Quit date: 08/15/2015   Tobacco comments:    04/29/16 2  cigs daily, 10/27/17 sometimes < .25 PPD  Vaping Use   Vaping status: Never Used  Substance and Sexual Activity   Alcohol use: No    Alcohol/week: 0.0 standard drinks of alcohol   Drug use: Not Currently    Types: Crack cocaine    Comment: last used in July 2025   Sexual activity: Not on file  Other Topics Concern   Not on file  Social History Narrative   04/29/17   Lives in shelter   Caffeine- a lot of  tea, coffee   Social Drivers of Corporate investment banker Strain: Not on file  Food Insecurity: Food Insecurity Present (10/24/2023)   Hunger Vital Sign    Worried About Running Out of Food in the Last Year: Sometimes true    Ran Out of Food in the Last Year: Sometimes true  Transportation Needs: Unmet Transportation Needs (10/24/2023)   PRAPARE - Administrator, Civil Service (Medical): Yes    Lack of Transportation (Non-Medical): Yes  Physical Activity: Not on file  Stress: Not on file  Social Connections: Unknown (10/24/2023)   Social Connection and Isolation Panel    Frequency of Communication with Friends and Family: Once a week    Frequency of Social Gatherings  with Friends and Family: Never    Attends Religious Services: Never    Database administrator or Organizations: No    Attends Banker Meetings: Never    Marital Status: Patient unable to answer   Past Medical History:  Past Medical History:  Diagnosis Date   Angioedema of lips 07/28/2012   left upper (07/29/2012)   Arthritis    hands and back   Chronic back pain    Cocaine abuse (HCC)    Diabetic neuropathy (HCC)    High cholesterol    Hypertension    Neuropathy    Type II diabetes mellitus (HCC)     Past Surgical History:  Procedure Laterality Date   BACK SURGERY     CYSTOSCOPY W/ URETERAL STENT PLACEMENT Right  02/23/2021   Procedure: CYSTOSCOPY WITH RETROGRADE PYELOGRAM/URETERAL STENT PLACEMENT;  Surgeon: Selma Donnice SAUNDERS, MD;  Location: [facility];  Service: Urology;  Laterality: Right;   CYSTOSCOPY/URETEROSCOPY/HOLMIUM LASER/STENT PLACEMENT Right 12/05/2022   Procedure: CYSTOSCOPY, RIGHT URETEROSCOPY, AND RIGHT URETERAL STENT PLACEMENT;  Surgeon: Shane Steffan BROCKS, MD;  Location: [facility];  Service: Urology;  Laterality: Right;  90 MINUTES   HERNIA REPAIR Right 04/01/2012   I & D EXTREMITY Left 12/13/2012   Procedure: IRRIGATION AND DEBRIDEMENT LEFT THUMB;  Surgeon: Franky SAUNDERS Curia, MD;  Location: [facility] OR;  Service: Orthopedics;  Laterality: Left;   INCISION AND DRAINAGE OF WOUND     boil on back/notes 07/14/2008 (07/29/2012)   INGUINAL HERNIA REPAIR  04/01/2012   Procedure: HERNIA REPAIR INGUINAL ADULT;  Surgeon: Sherlean JINNY Laughter, MD;  Location: [facility] OR;  Service: General;  Laterality: Right;   INGUINAL HERNIA REPAIR Left 09/06/2016   Procedure: OPEN REPAIR LEFT INGUINAL HERNIA;  Surgeon: Tanda Locus, MD;  Location: [facility] OR;  Service: General;  Laterality: Left;   INSERTION OF MESH Left 09/06/2016   Procedure: INSERTION OF MESH;  Surgeon: Tanda Locus, MD;  Location: [facility] OR;  Service: General;  Laterality: Left;   TONSILLECTOMY      Current Medications: Current Facility-Administered Medications  Medication Dose Route Frequency Provider Last Rate Last Admin   acetaminophen  (TYLENOL ) tablet 650 mg  650 mg Oral Q6H PRN Wilkie Majel RAMAN, FNP   650 mg at 10/28/23 0919   alum & mag hydroxide-simeth (MAALOX/MYLANTA) 200-200-20 MG/5ML suspension 30 mL  30 mL Oral Q4H PRN Wilkie Majel RAMAN, FNP       amLODipine  (NORVASC ) tablet 10 mg  10 mg Oral Daily Wilkie Majel RAMAN, FNP   10 mg at 10/28/23 0919   atorvastatin  (LIPITOR) tablet 40 mg  40 mg Oral Daily Wilkie Majel RAMAN, FNP   40 mg at 10/28/23 0919   buPROPion  (WELLBUTRIN  XL) 24 hr tablet 150 mg  150 mg Oral Daily Madaram, Kondal R, MD   150 mg at  10/28/23 0919   doxepin  (SINEQUAN ) capsule 25 mg  25 mg Oral QHS Jessyca Sloan, MD   25 mg at 10/27/23 2203   DULoxetine  (CYMBALTA ) DR capsule 60 mg  60 mg Oral Daily Wilkie Majel RAMAN, FNP   60 mg at 10/28/23 9080   gabapentin  (NEURONTIN ) capsule 300 mg  300 mg Oral TID Starkes-Perry, Takia S, FNP   300 mg at 10/28/23 0919   hydrOXYzine  (ATARAX ) tablet 10 mg  10 mg Oral TID PRN Starkes-Perry, Takia S, FNP   10 mg at 10/27/23 1003   insulin  aspart (novoLOG ) injection 0-5 Units  0-5 Units Subcutaneous QHS Donnelly Mellow, MD  insulin  aspart (novoLOG ) injection 0-9 Units  0-9 Units Subcutaneous TID WC Yong Grieser, MD   2 Units at 10/28/23 1201   insulin  aspart (novoLOG ) injection 4 Units  4 Units Subcutaneous TID WC Tynesia Harral, MD   4 Units at 10/28/23 1200   insulin  glargine-yfgn (SEMGLEE ) injection 30 Units  30 Units Subcutaneous QHS Elner Seifert, MD   30 Units at 10/27/23 2201   magnesium  hydroxide (MILK OF MAGNESIA) suspension 30 mL  30 mL Oral Daily PRN Starkes-Perry, Takia S, FNP       metFORMIN  (GLUCOPHAGE ) tablet 500 mg  500 mg Oral BID WC Madaram, Kondal R, MD   500 mg at 10/28/23 0920   nicotine  (NICODERM CQ  - dosed in mg/24 hours) patch 14 mg  14 mg Transdermal Daily Little Winton, MD   14 mg at 10/25/23 1015   OLANZapine  (ZYPREXA ) injection 5 mg  5 mg Intramuscular TID PRN Starkes-Perry, Takia S, FNP       OLANZapine  zydis (ZYPREXA ) disintegrating tablet 5 mg  5 mg Oral TID PRN Starkes-Perry, Takia S, FNP       traZODone  (DESYREL ) tablet 50 mg  50 mg Oral QHS PRN Bobbitt, Shalon E, NP   50 mg at 10/27/23 2203    Lab Results:  Results for orders placed or performed during the hospital encounter of 10/24/23 (from the past 48 hours)  Glucose, capillary     Status: Abnormal   Collection Time: 10/26/23  4:28 PM  Result Value Ref Range   Glucose-Capillary 277 (H) 70 - 99 mg/dL    Comment: Glucose reference range applies only to samples taken after fasting for at  least 8 hours.  Glucose, capillary     Status: Abnormal   Collection Time: 10/26/23  7:52 PM  Result Value Ref Range   Glucose-Capillary 222 (H) 70 - 99 mg/dL    Comment: Glucose reference range applies only to samples taken after fasting for at least 8 hours.   Comment 1 Notify RN   Glucose, capillary     Status: Abnormal   Collection Time: 10/27/23  7:27 AM  Result Value Ref Range   Glucose-Capillary 419 (H) 70 - 99 mg/dL    Comment: Glucose reference range applies only to samples taken after fasting for at least 8 hours.  Glucose, capillary     Status: Abnormal   Collection Time: 10/27/23 11:29 AM  Result Value Ref Range   Glucose-Capillary 42 (LL) 70 - 99 mg/dL    Comment: Glucose reference range applies only to samples taken after fasting for at least 8 hours.   Comment 1 Notify RN   Glucose, capillary     Status: Abnormal   Collection Time: 10/27/23 12:27 PM  Result Value Ref Range   Glucose-Capillary 285 (H) 70 - 99 mg/dL    Comment: Glucose reference range applies only to samples taken after fasting for at least 8 hours.  Glucose, capillary     Status: Abnormal   Collection Time: 10/27/23  4:32 PM  Result Value Ref Range   Glucose-Capillary 312 (H) 70 - 99 mg/dL    Comment: Glucose reference range applies only to samples taken after fasting for at least 8 hours.  Glucose, capillary     Status: Abnormal   Collection Time: 10/28/23  7:34 AM  Result Value Ref Range   Glucose-Capillary 225 (H) 70 - 99 mg/dL    Comment: Glucose reference range applies only to samples taken after fasting for at least 8 hours.  Glucose, capillary     Status: Abnormal   Collection Time: 10/28/23 11:37 AM  Result Value Ref Range   Glucose-Capillary 200 (H) 70 - 99 mg/dL    Comment: Glucose reference range applies only to samples taken after fasting for at least 8 hours.    Blood Alcohol level:  Lab Results  Component Value Date   [facility] <15 10/22/2023   ETH <15 07/15/2023    Metabolic  Disorder Labs: Lab Results  Component Value Date   HGBA1C 13.9 (H) 09/27/2023   MPG 352.23 09/27/2023   MPG 191.51 05/23/2023   Lab Results  Component Value Date   PROLACTIN 6.9 06/19/2022   Lab Results  Component Value Date   CHOL 197 10/23/2023   TRIG 113 10/23/2023   HDL 62 10/23/2023   CHOLHDL 3.2 10/23/2023   VLDL 23 10/23/2023   LDLCALC 112 (H) 10/23/2023   LDLCALC 89 05/18/2023    Physical Findings: AIMS:  , ,  ,  ,    CIWA:    COWS:      Psychiatric Specialty Exam:  Presentation  General Appearance:  Disheveled  Eye Contact: Fair  Speech: Clear and Coherent  Speech Volume: Normal    Mood and Affect  Mood: Dysphoric  Affect: Congruent   Thought Process  Thought Processes: Coherent  Descriptions of Associations:Intact  Orientation:Full (Time, Place and Person)  Thought Content:WDL  Hallucinations: Denies Ideas of Reference:None  Suicidal Thoughts: Denies Homicidal Thoughts: Denies  Sensorium  Memory: Immediate Good; Recent Good  Judgment: Good  Insight: Good   Executive Functions  Concentration: Fair  Attention Span: Fair  Recall: Fair  Fund of Knowledge: Fair  Language: Fair   Psychomotor Activity  Psychomotor Activity:No data recorded Musculoskeletal: Strength & Muscle Tone: within normal limits Gait & Station: unsteady Assets  Assets: Desire for Improvement; Housing    Physical Exam: Physical Exam Vitals and nursing note reviewed.    ROS Blood pressure 134/82, pulse 67, temperature 98.4 F (36.9 C), resp. rate 18, height 5' 8 (1.727 m), weight 70.1 kg, SpO2 95%. Body mass index is 23.49 kg/m.  Diagnosis: Principal Problem:   MDD (major depressive disorder), recurrent episode, severe (HCC) Stimulant use disorderStimulant use disorder severe  PLAN: Safety and Monitoring:  -- Voluntary admission to inpatient psychiatric unit for safety, stabilization and treatment  -- Daily contact with  patient to assess and evaluate symptoms and progress in treatment  -- Patient's case to be discussed in multi-disciplinary team meeting  -- Observation Level : q15 minute checks  -- Vital signs:  q12 hours  -- Precautions: suicide, elopement, and assault -- Encouraged patient to participate in unit milieu and in scheduled group therapies  2. Psychiatric Diagnoses and Treatment:    Wellbutrin  XL 150 mg  Cymbalta  DR 60 mg daily     3. Medical Issues Being Addressed:   No urgent medical needs noted  4. Discharge Planning:   -- Social work and case management to assist with discharge planning and identification of hospital follow-up needs prior to discharge  -- Estimated LOS: 3-4 days  Savas Elvin, MD 10/28/2023, 12:18 PM

## 2023-10-28 NOTE — BHH Counselor (Signed)
 CSW received returned call from [place] at [facility] who reports they cannot take pt because he has [employer] and they cannot take [employer].   Lum Croft, MSW, [place] 10/28/2023 2:52 PM

## 2023-10-28 NOTE — Group Note (Signed)
 Physical/Occupational Therapy Group Note  Group Topic: Yoga  Group Date: 10/28/2023 Start Time: 1400 End Time: 1427 Facilitators: Cynitha Berte, Alm Hamilton, PT   Group Description: Group participated with series of yoga poses, designed to emphasize functional sitting balance, core stability, generalized flexibility and overall posture.  Incorporated deep breathing techniques with poses, working to promote relaxation, mindfulness and focus with targeted activities.   Discussed benefits of yoga in improving mood and self-esteem, reducing stress and anxiety, and promoting functional strength and balance for each participant.  Discussed ways to integrate into each participant's daily routine.  Provided handout with written and pictorial descriptions of included yoga movements to be utilized as appropriate outside of group time.  Therapeutic Goal(s):  Demonstrate safe ability to participate with yoga poses during group activity. Identify one benefit of participation with yoga poses as part of each participant's exercise/movement routine. Identify 1-2 individual poses that participant feels most beneficial to his/her needs and that he/she can easily replicate outside of group.  Individual Participation: Pt actively participated with the discussion and physical activity components of the session. Pt able to modify activities as needed with minimal cuing to avoid adverse symptoms/pain.  Participation Level: Active   Participation Quality: Minimal Cues   Behavior: Appropriate   Speech/Thought Process: Relevant   Affect/Mood: Flat   Insight: Moderate   Judgement: Moderate   Modes of Intervention: Activity, Discussion, and Education  Patient Response to Interventions:  Attentive and Receptive   Plan: Continue to engage patient in PT/OT groups 1 - 2x/week.  CHARM Hamilton Bertin PT, DPT 10/28/23, 4:47 PM

## 2023-10-28 NOTE — Group Note (Signed)
 Date:  10/28/2023 Time:  5:22 PM  Group Topic/Focus:  Building Self Esteem:   The Focus of this group is helping patients become aware of the effects of self-esteem on their lives, the things they and others do that enhance or undermine their self-esteem, seeing the relationship between their level of self-esteem and the choices they make and learning ways to enhance self-esteem.    Participation Level:  Active  Participation Quality:  Appropriate  Affect:  Appropriate  Cognitive:  Appropriate  Insight: Appropriate  Engagement in Group:  Engaged  Modes of Intervention:  Activity  Additional Comments:    David Wyatt 10/28/2023, 5:22 PM

## 2023-10-28 NOTE — BHH Counselor (Signed)
 CSW faxed referral to [facility]) for substance use treatment (F:  [phone] )  Lum Croft, MSW, [facility] 10/28/2023 1:49 PM

## 2023-10-28 NOTE — Group Note (Signed)
 Recreation Therapy Group Note   Group Topic:Other  Group Date: 10/28/2023 Start Time: 1510 End Time: 1550 Facilitators: Celestia Gauze E, LRT; Raynaldo Lum [place], [place] Location: Courtyard  Group Description: Outdoor Recreation. Patients had the option to play corn hole, ring toss, bowling or listening to music while outside in the courtyard getting fresh air and sunlight. Patients helped water and prune the raised garden beds. LRT and patients discussed things that they enjoy doing in their free time outside of the hospital. LRT encouraged patients to drink water after being active and getting their heart rate up.   Goal Area(s) Addressed: Patient will identify leisure interests.  Patient will practice healthy decision making. Patient will engage in recreation activity.   Affect/Mood: Appropriate and Flat   Participation Level: Moderate   Participation Quality: Independent   Behavior: Cooperative   Speech/Thought Process: Coherent   Insight: Fair   Judgement: Fair    Modes of Intervention: Music and Open Conversation   Patient Response to Interventions:  Receptive   Education Outcome:  In group clarification offered    Clinical Observations/Individualized Feedback: David Wyatt was somewhat active in their participation of session activities and group discussion. Pt minimally interacted with LRT and peers duration of session.    Plan: Continue to engage patient in RT group sessions 2-3x/week.   Gauze FORBES Celestia, LRT, [facility] 10/28/2023 5:15 PM

## 2023-10-29 ENCOUNTER — Telehealth: Payer: Self-pay

## 2023-10-29 ENCOUNTER — Inpatient Hospital Stay: Payer: MEDICAID | Admitting: Family Medicine

## 2023-10-29 LAB — GLUCOSE, CAPILLARY
Glucose-Capillary: 235 mg/dL — ABNORMAL HIGH (ref 70–99)
Glucose-Capillary: 275 mg/dL — ABNORMAL HIGH (ref 70–99)
Glucose-Capillary: 174 mg/dL — ABNORMAL HIGH (ref 70–99)
Glucose-Capillary: 285 mg/dL — ABNORMAL HIGH (ref 70–99)

## 2023-10-29 NOTE — Progress Notes (Signed)
   10/29/23 0800  Psychosocial Assessment  Patient Complaints Confusion;Insomnia  Eye Contact Fair  Facial Expression Anxious  Affect Anxious  Speech Logical/coherent  Interaction Assertive  Motor Activity Unsteady  Appearance/Hygiene In scrubs  Behavior Characteristics Cooperative;Appropriate to situation  Mood Pleasant  Thought Process  Coherency WDL  Content WDL  Delusions None reported or observed  Perception WDL  Hallucination None reported or observed  Judgment Impaired  Confusion Mild  Danger to Self  Current suicidal ideation? Denies  Agreement Not to Harm Self Yes  Description of Agreement Verbal  Danger to Others  Danger to Others None reported or observed

## 2023-10-29 NOTE — BHH Suicide Risk Assessment (Signed)
 BHH INPATIENT:  Family/Significant Other Suicide Prevention Education  Suicide Prevention Education:  Patient Refusal for Family/Significant Other Suicide Prevention Education: The patient David Wyatt has refused to provide written consent for family/significant other to be provided Family/Significant Other Suicide Prevention Education during admission and/or prior to discharge.  Physician notified.  [name] 10/29/2023, 4:18 PM

## 2023-10-29 NOTE — Plan of Care (Signed)
  Problem: Education: Goal: Mental status will improve Outcome: Progressing   Problem: Activity: Goal: Sleeping patterns will improve Outcome: Progressing   Problem: Safety: Goal: Periods of time without injury will increase Outcome: Progressing   Problem: Coping: Goal: Coping ability will improve Outcome: Progressing   

## 2023-10-29 NOTE — BHH Counselor (Signed)
 CSW reached out to [facility] to check on status of referral.   Ms.Michelle at [facility] reports pt does NOT meet qualifications for admissions as he is on a sliding scale insulin .   CSW will inform care team  Lum Croft, MSW, [facility] 10/29/2023 11:23 AM

## 2023-10-29 NOTE — Group Note (Signed)

## 2023-10-29 NOTE — Progress Notes (Signed)
 Patient has been easily compliant with all aspects of nursing care offered this shift. Calm and cooperative upon approach. Received PRN vistaril  10mg  and trazodone  50mg  P.O. for anxiety and sleep with HS scheduled medications, both noted to be effective. Denies all psychiatric symptoms and verbally contracts for safety. Slept throughout the night without any events. All orders maintained as written.   Estimated Sleeping Duration (Last 24 Hours): 6.50-9.25 hours      10/28/23 2243  Psych Admission Type (Psych Patients Only)  Admission Status Voluntary  Psychosocial Assessment  Patient Complaints Confusion;Insomnia  Eye Contact Fair  Facial Expression Animated  Affect Anxious;Appropriate to circumstance  Speech Logical/coherent  Interaction Assertive  Motor Activity Unsteady  Appearance/Hygiene In scrubs  Behavior Characteristics Cooperative  Mood Pleasant  Thought Process  Coherency WDL  Content WDL  Delusions None reported or observed  Perception WDL  Hallucination None reported or observed  Judgment Impaired  Confusion Mild  Danger to Self  Current suicidal ideation? Denies  Agreement Not to Harm Self Yes  Description of Agreement verbal  Danger to Others  Danger to Others None reported or observed

## 2023-10-29 NOTE — Progress Notes (Signed)
   10/28/23 1545  Spiritual Encounters  Type of Visit Initial  Care provided to: Patient  Referral source Chaplain assessment  Reason for visit Routine spiritual support  OnCall Visit No   This chaplain met briefly with Mr. Jerris Fleer while group was outside.  Mr. Moradi shared concerns about placement and coping with uncertainty and hopes for care coordination. Feelings of uncertainty included perceived unfulfilled requests for care relating to 1) need for ear cleaning that is impacting hearing and participation 2) toenail care as a diabetic.  I provided non-anxious, compassionate presence. I affirmed Mr. Rapley and offered normalization of emotion. I offered to work as a care advocate on his behalf and will pass on these concerns in hopes there may be a plan for care access. I offered words of encouragement and maintaining focus on the present.  Adryel Wortmann L. [place]rica, M.Div

## 2023-10-29 NOTE — Telephone Encounter (Signed)
 Call received from the patient stating he is at [facility] and he is ready for rehab but the facility that has accepted him does not have a bed available for him for 6 weeks.   In the meantime, he said the behavioral health hospital will just discharge him and he will have nowhere to go. He is homeless.  I asked if I could speak to his SW and she is in a meeting. I spoke to Time who took my call back information and she said she would have [place], [place] call me when she is available.

## 2023-10-29 NOTE — Progress Notes (Signed)
   10/29/23 2100  Psych Admission Type (Psych Patients Only)  Admission Status Voluntary  Psychosocial Assessment  Patient Complaints Worrying  Eye Contact Fair  Facial Expression Worried  Affect Flat  Speech Logical/coherent  Interaction Assertive  Motor Activity Unsteady  Appearance/Hygiene In scrubs  Behavior Characteristics Cooperative;Appropriate to situation  Mood Pleasant  Thought Process  Coherency WDL  Content WDL  Delusions None reported or observed  Perception WDL  Hallucination None reported or observed  Judgment Impaired  Confusion Mild  Danger to Self  Current suicidal ideation? Denies  Agreement Not to Harm Self Yes  Description of Agreement verbal  Danger to Others  Danger to Others None reported or observed

## 2023-10-29 NOTE — Telephone Encounter (Signed)
 Call received from [place], LCSWA and I explained to her about my conversation with the patient. She said she has referred him out to multiple rehab facilities and as far as she is aware, he has not followed through with what he has to do to complete the intake process and he has not been accepted at a facility and he will be ready for discharge in the next few days.    She understands that he is homeless and has nowhere to go after discharge. He is known to their facility.   I explained that I had referred him to the [facility] and I have also tried to get him connected with his [facility] care management agency so he can be assigned a Futures trader.  I provided her with the contact information for Tawni Martin/ [employer] : [phone] and informed her that the agency has been trying to get in contact with the patient to assign him a tailored care manager. [place] said she would try to contact Ms. Gladis.

## 2023-10-29 NOTE — Progress Notes (Addendum)
 [facility] MD Progress Note  10/29/2023 9:07 AM David Wyatt  MRN:  [id number]  David Wyatt is a 63 y.o. male admitted: Presented to the ED for 10/22/2023  5:21 PM for diabetic ketoacidoses. He carries the psychiatric diagnoses of depression, stimulant use disorder (cocaine), tobacco use disorder, and has a past medical history of  type 2 diabetes with long term use of insulin  and neuropathy, hypertension, hyperlipidemia, arthritis, and chronic back pain. His current presentation of suicidal ideation is most consistent with major depressive disorder. Subjective:  Chart reviewed, case discussed in multidisciplinary meeting, patient seen during rounds.  Patient is noted to be sitting in the groups and he tried to come out of the groups to talk to the provider.  Patient was encouraged to participate in groups as he has been sleeping in his room most of the days.  Patient was also encouraged to start working on calling the rehabs that he wanted to go to as his discharge dates are coming up.  Per nursing patient is not showing any effort to work on his post discharge planning including calling rehabs to complete the intakes.  He endorses conditional SI stating he is unable to care for himself in the community and reports memory problems.  On observation patient is not responding to any internal stimuli, is maintaining safe behaviors, engaging with peers and responds very appropriately to the questions.  There is no display of confusion or memory problems as he does address the provider as Dr. And discusses his requests of care in spite of telling the provider that he does not remember the provider.  He denies auditory hallucinations and reports intermittent visual hallucinations but is not responding to any of the internal or external stimuli.  Sleep: Negative  Appetite:  Negative  Past Psychiatric History: see h&P Family History:  Family History  Problem Relation Age of Onset   Diabetes Mother    Kidney  disease Mother    Hyperlipidemia Mother    Hyperlipidemia Father    Diabetes Father    Social History:  Social History   Substance and Sexual Activity  Alcohol Use No   Alcohol/week: 0.0 standard drinks of alcohol     Social History   Substance and Sexual Activity  Drug Use Not Currently   Types: Crack cocaine   Comment: last used in July 2025    Social History   Socioeconomic History   Marital status: Divorced    Spouse name: Not on file   Number of children: 3   Years of education: 12   Highest education level: Not on file  Occupational History    Comment: disabled  Tobacco Use   Smoking status: Every Day    Current packs/day: 0.25    Average packs/day: 0.3 packs/day for 30.0 years (7.5 ttl pk-yrs)    Types: Cigarettes   Smokeless tobacco: Former    Quit date: 08/15/2015   Tobacco comments:    04/29/16 2  cigs daily, 10/27/17 sometimes < .25 PPD  Vaping Use   Vaping status: Never Used  Substance and Sexual Activity   Alcohol use: No    Alcohol/week: 0.0 standard drinks of alcohol   Drug use: Not Currently    Types: Crack cocaine    Comment: last used in July 2025   Sexual activity: Not on file  Other Topics Concern   Not on file  Social History Narrative   04/29/17   Lives in shelter   Caffeine- a lot of  tea, coffee  Social Drivers of Corporate investment banker Strain: Not on file  Food Insecurity: Food Insecurity Present (10/24/2023)   Hunger Vital Sign    Worried About Running Out of Food in the Last Year: Sometimes true    Ran Out of Food in the Last Year: Sometimes true  Transportation Needs: Unmet Transportation Needs (10/24/2023)   PRAPARE - Administrator, Civil Service (Medical): Yes    Lack of Transportation (Non-Medical): Yes  Physical Activity: Not on file  Stress: Not on file  Social Connections: Unknown (10/24/2023)   Social Connection and Isolation Panel    Frequency of Communication with Friends and Family: Once a week     Frequency of Social Gatherings with Friends and Family: Never    Attends Religious Services: Never    Database administrator or Organizations: No    Attends Banker Meetings: Never    Marital Status: Patient unable to answer   Past Medical History:  Past Medical History:  Diagnosis Date   Angioedema of lips 07/28/2012   left upper (07/29/2012)   Arthritis    hands and back   Chronic back pain    Cocaine abuse (HCC)    Diabetic neuropathy (HCC)    High cholesterol    Hypertension    Neuropathy    Type II diabetes mellitus (HCC)     Past Surgical History:  Procedure Laterality Date   BACK SURGERY     CYSTOSCOPY W/ URETERAL STENT PLACEMENT Right 02/23/2021   Procedure: CYSTOSCOPY WITH RETROGRADE PYELOGRAM/URETERAL STENT PLACEMENT;  Surgeon: Selma Donnice SAUNDERS, MD;  Location: [facility];  Service: Urology;  Laterality: Right;   CYSTOSCOPY/URETEROSCOPY/HOLMIUM LASER/STENT PLACEMENT Right 12/05/2022   Procedure: CYSTOSCOPY, RIGHT URETEROSCOPY, AND RIGHT URETERAL STENT PLACEMENT;  Surgeon: Shane Steffan BROCKS, MD;  Location: [facility];  Service: Urology;  Laterality: Right;  90 MINUTES   HERNIA REPAIR Right 04/01/2012   I & D EXTREMITY Left 12/13/2012   Procedure: IRRIGATION AND DEBRIDEMENT LEFT THUMB;  Surgeon: Franky SAUNDERS Curia, MD;  Location: [facility] OR;  Service: Orthopedics;  Laterality: Left;   INCISION AND DRAINAGE OF WOUND     boil on back/notes 07/14/2008 (07/29/2012)   INGUINAL HERNIA REPAIR  04/01/2012   Procedure: HERNIA REPAIR INGUINAL ADULT;  Surgeon: Sherlean JINNY Laughter, MD;  Location: [facility] OR;  Service: General;  Laterality: Right;   INGUINAL HERNIA REPAIR Left 09/06/2016   Procedure: OPEN REPAIR LEFT INGUINAL HERNIA;  Surgeon: Tanda Locus, MD;  Location: [facility] OR;  Service: General;  Laterality: Left;   INSERTION OF MESH Left 09/06/2016   Procedure: INSERTION OF MESH;  Surgeon: Tanda Locus, MD;  Location: [facility] OR;  Service: General;  Laterality: Left;   TONSILLECTOMY      Current  Medications: Current Facility-Administered Medications  Medication Dose Route Frequency Provider Last Rate Last Admin   acetaminophen  (TYLENOL ) tablet 650 mg  650 mg Oral Q6H PRN Wilkie Majel RAMAN, FNP   650 mg at 10/28/23 0919   alum & mag hydroxide-simeth (MAALOX/MYLANTA) 200-200-20 MG/5ML suspension 30 mL  30 mL Oral Q4H PRN Wilkie Majel RAMAN, FNP       amLODipine  (NORVASC ) tablet 10 mg  10 mg Oral Daily Wilkie Majel RAMAN, FNP   10 mg at 10/28/23 0919   atorvastatin  (LIPITOR) tablet 40 mg  40 mg Oral Daily Wilkie Majel RAMAN, FNP   40 mg at 10/28/23 0919   buPROPion  (WELLBUTRIN  XL) 24 hr tablet 300 mg  300 mg Oral Daily Tasean Mancha,  Allyn, MD       doxepin  (SINEQUAN ) capsule 25 mg  25 mg Oral QHS Audriana Aldama, MD   25 mg at 10/28/23 2109   DULoxetine  (CYMBALTA ) DR capsule 60 mg  60 mg Oral Daily Starkes-Perry, Takia S, FNP   60 mg at 10/28/23 0919   gabapentin  (NEURONTIN ) capsule 300 mg  300 mg Oral TID Starkes-Perry, Takia S, FNP   300 mg at 10/28/23 2109   hydrOXYzine  (ATARAX ) tablet 10 mg  10 mg Oral TID PRN Starkes-Perry, Takia S, FNP   10 mg at 10/28/23 2109   insulin  aspart (novoLOG ) injection 0-5 Units  0-5 Units Subcutaneous QHS Noriah Osgood, MD   2 Units at 10/28/23 2102   insulin  aspart (novoLOG ) injection 0-9 Units  0-9 Units Subcutaneous TID WC Rayansh Herbst, MD   3 Units at 10/29/23 9185   insulin  aspart (novoLOG ) injection 4 Units  4 Units Subcutaneous TID WC Lolitha Tortora, MD   4 Units at 10/29/23 0813   insulin  glargine-yfgn (SEMGLEE ) injection 30 Units  30 Units Subcutaneous QHS Lollie Gunner, MD   30 Units at 10/28/23 2103   magnesium  hydroxide (MILK OF MAGNESIA) suspension 30 mL  30 mL Oral Daily PRN Starkes-Perry, Takia S, FNP       metFORMIN  (GLUCOPHAGE ) tablet 500 mg  500 mg Oral BID WC Madaram, Kondal R, MD   500 mg at 10/29/23 9170   nicotine  (NICODERM CQ  - dosed in mg/24 hours) patch 14 mg  14 mg Transdermal Daily Fancy Dunkley, MD   14 mg  at 10/25/23 1015   OLANZapine  (ZYPREXA ) injection 5 mg  5 mg Intramuscular TID PRN Starkes-Perry, Takia S, FNP       OLANZapine  zydis (ZYPREXA ) disintegrating tablet 5 mg  5 mg Oral TID PRN Starkes-Perry, Takia S, FNP       traZODone  (DESYREL ) tablet 50 mg  50 mg Oral QHS PRN Bobbitt, Shalon E, NP   50 mg at 10/28/23 2109    Lab Results:  Results for orders placed or performed during the hospital encounter of 10/24/23 (from the past 48 hours)  Glucose, capillary     Status: Abnormal   Collection Time: 10/27/23 11:29 AM  Result Value Ref Range   Glucose-Capillary 42 (LL) 70 - 99 mg/dL    Comment: Glucose reference range applies only to samples taken after fasting for at least 8 hours.   Comment 1 Notify RN   Glucose, capillary     Status: Abnormal   Collection Time: 10/27/23 12:27 PM  Result Value Ref Range   Glucose-Capillary 285 (H) 70 - 99 mg/dL    Comment: Glucose reference range applies only to samples taken after fasting for at least 8 hours.  Glucose, capillary     Status: Abnormal   Collection Time: 10/27/23  4:32 PM  Result Value Ref Range   Glucose-Capillary 312 (H) 70 - 99 mg/dL    Comment: Glucose reference range applies only to samples taken after fasting for at least 8 hours.  Glucose, capillary     Status: Abnormal   Collection Time: 10/28/23  7:34 AM  Result Value Ref Range   Glucose-Capillary 225 (H) 70 - 99 mg/dL    Comment: Glucose reference range applies only to samples taken after fasting for at least 8 hours.  Glucose, capillary     Status: Abnormal   Collection Time: 10/28/23 11:37 AM  Result Value Ref Range   Glucose-Capillary 200 (H) 70 - 99 mg/dL    Comment:  Glucose reference range applies only to samples taken after fasting for at least 8 hours.  Glucose, capillary     Status: Abnormal   Collection Time: 10/28/23  4:28 PM  Result Value Ref Range   Glucose-Capillary 246 (H) 70 - 99 mg/dL    Comment: Glucose reference range applies only to samples taken  after fasting for at least 8 hours.  Glucose, capillary     Status: Abnormal   Collection Time: 10/28/23  7:17 PM  Result Value Ref Range   Glucose-Capillary 214 (H) 70 - 99 mg/dL    Comment: Glucose reference range applies only to samples taken after fasting for at least 8 hours.  Glucose, capillary     Status: Abnormal   Collection Time: 10/29/23  7:41 AM  Result Value Ref Range   Glucose-Capillary 235 (H) 70 - 99 mg/dL    Comment: Glucose reference range applies only to samples taken after fasting for at least 8 hours.    Blood Alcohol level:  Lab Results  Component Value Date   [facility] <15 10/22/2023   ETH <15 07/15/2023    Metabolic Disorder Labs: Lab Results  Component Value Date   HGBA1C 13.9 (H) 09/27/2023   MPG 352.23 09/27/2023   MPG 191.51 05/23/2023   Lab Results  Component Value Date   PROLACTIN 6.9 06/19/2022   Lab Results  Component Value Date   CHOL 197 10/23/2023   TRIG 113 10/23/2023   HDL 62 10/23/2023   CHOLHDL 3.2 10/23/2023   VLDL 23 10/23/2023   LDLCALC 112 (H) 10/23/2023   LDLCALC 89 05/18/2023    Physical Findings: AIMS:  , ,  ,  ,    CIWA:    COWS:      Psychiatric Specialty Exam:  Presentation  General Appearance:  Stated age Eye Contact: Fair  Speech: Clear and Coherent  Speech Volume: Normal    Mood and Affect  Mood: Fine Affect: Congruent   Thought Process  Thought Processes: Coherent  Descriptions of Associations:Intact  Orientation:Full (Time, Place and Person)  Thought Content:WDL  Hallucinations: Denies Ideas of Reference:None  Suicidal Thoughts: Conditional SI but no plan Homicidal Thoughts: Denies  Sensorium  Memory: Immediate Good; Recent Good  Judgment: Good  Insight: Good   Executive Functions  Concentration: Fair  Attention Span: Fair  Recall: Fair  Fund of Knowledge: Fair  Language: Fair   Psychomotor Activity  Psychomotor Activity:No data  recorded Musculoskeletal: Strength & Muscle Tone: within normal limits Gait & Station: unsteady Assets  Assets: Desire for Improvement; Housing    Physical Exam: Physical Exam Vitals and nursing note reviewed.    ROS Blood pressure 117/80, pulse 74, temperature 97.9 F (36.6 C), resp. rate 17, height 5' 8 (1.727 m), weight 70.1 kg, SpO2 100%. Body mass index is 23.49 kg/m.  Diagnosis: Principal Problem:   MDD (major depressive disorder), recurrent episode, severe (HCC) Stimulant use disorder severe  PLAN: Safety and Monitoring:  -- Voluntary admission to inpatient psychiatric unit for safety, stabilization and treatment  -- Daily contact with patient to assess and evaluate symptoms and progress in treatment  -- Patient's case to be discussed in multi-disciplinary team meeting  -- Observation Level : q15 minute checks  -- Vital signs:  q12 hours  -- Precautions: suicide, elopement, and assault -- Encouraged patient to participate in unit milieu and in scheduled group therapies  2. Psychiatric Diagnoses and Treatment:    Increased Wellbutrin  XL 300mg   Cymbalta  DR 60 mg daily  3. Medical Issues Being Addressed:   No urgent medical needs noted  4. Discharge Planning:   -- Social work and case management to assist with discharge planning and identification of hospital follow-up needs prior to discharge  -- Estimated LOS: 3-4 days  Allyn Foil, MD 10/29/2023, 9:07 AM

## 2023-10-29 NOTE — BHH Counselor (Signed)
 CSW contacted David Wyatt, pt's International aid/development worker at [facility].   David reports that pt told her he has gotten into a rehab but that they won't have a bed for another 6 weeks.   CSW reported to David that pt did not get into a rehab and that pt has made no progress towards any progression.   David reports that in the past she has secured housing for pt and that because he did not follow-up in the way that he is supposed to follow-up in order to maintain the housing.   CSW informed David Wyatt that pt would be up for discharge Friday.   She reports that if pt gets discharged Friday he will likely come back to [facility], demand housing, and they will have to contact BHRT to remove him like they have done many times before in the past.   David Wyatt provided CSW information for pt's case manager through [facility].   [facility] Tailored Care Manager David Wyatt, [facility] [phone])  CSW will contact Case Manager to have them connect with pt prior to discharge if possible.   David Wyatt, MSW, [place] 10/29/2023 3:09 PM

## 2023-10-29 NOTE — Group Note (Signed)
 Date:  10/29/2023 Time:  10:51 AM  Group Topic/Focus:  Coping With Mental Health Crisis:   The purpose of this group is to help patients identify strategies for coping with mental health crisis.  Group discusses possible causes of crisis and ways to manage them effectively.    Participation Level:  Active  Participation Quality:  Appropriate  Affect:  Appropriate  Cognitive:  Alert  Insight: Appropriate  Engagement in Group:  Engaged  Modes of Intervention:  Discussion   David Wyatt 10/29/2023, 10:51 AM

## 2023-10-29 NOTE — BHH Counselor (Signed)
 CSW contacted [facility] to check on pt's referral.   They report pt has NOT called at this time to complete pre-screening.   CSW will inform care team.   Lum Croft, MSW, [facility] 10/29/2023 11:27 AM

## 2023-10-29 NOTE — Group Note (Signed)
 Date:  10/29/2023 Time:  11:18 PM  Group Topic/Focus:  Wrap-Up Group:   The focus of this group is to help patients review their daily goal of treatment and discuss progress on daily workbooks.    Participation Level:  Active  Participation Quality:  Appropriate  Affect:  Appropriate  Cognitive:  Alert  Insight: Appropriate  Engagement in Group:  Engaged  Modes of Intervention:  Discussion  Additional Comments:    David Wyatt 10/29/2023, 11:18 PM

## 2023-10-29 NOTE — Plan of Care (Signed)
  Problem: Education: Goal: Knowledge of [facility] Education information/materials will improve Outcome: Progressing Goal: Emotional status will improve Outcome: Progressing Goal: Mental status will improve Outcome: Progressing Goal: Verbalization of understanding the information provided will improve Outcome: Progressing   Problem: Activity: Goal: Interest or engagement in activities will improve Outcome: Progressing Goal: Sleeping patterns will improve Outcome: Progressing   Problem: Coping: Goal: Ability to verbalize frustrations and anger appropriately will improve Outcome: Progressing Goal: Ability to demonstrate self-control will improve Outcome: Progressing   Problem: Health Behavior/Discharge Planning: Goal: Identification of resources available to assist in meeting health care needs will improve Outcome: Progressing Goal: Compliance with treatment plan for underlying cause of condition will improve Outcome: Progressing   Problem: Physical Regulation: Goal: Ability to maintain clinical measurements within normal limits will improve Outcome: Progressing   Problem: Safety: Goal: Periods of time without injury will increase Outcome: Progressing   Problem: Education: Goal: Ability to describe self-care measures that may prevent or decrease complications (Diabetes Survival Skills Education) will improve Outcome: Progressing Goal: Individualized Educational Video(s) Outcome: Progressing   Problem: Coping: Goal: Ability to adjust to condition or change in health will improve Outcome: Progressing   Problem: Fluid Volume: Goal: Ability to maintain a balanced intake and output will improve Outcome: Progressing   Problem: Health Behavior/Discharge Planning: Goal: Ability to identify and utilize available resources and services will improve Outcome: Progressing Goal: Ability to manage health-related needs will improve Outcome: Progressing   Problem:  Metabolic: Goal: Ability to maintain appropriate glucose levels will improve Outcome: Progressing   Problem: Nutritional: Goal: Maintenance of adequate nutrition will improve Outcome: Progressing Goal: Progress toward achieving an optimal weight will improve Outcome: Progressing   Problem: Skin Integrity: Goal: Risk for impaired skin integrity will decrease Outcome: Progressing   Problem: Tissue Perfusion: Goal: Adequacy of tissue perfusion will improve Outcome: Progressing   Problem: Coping: Goal: Coping ability will improve Outcome: Progressing   Problem: Education: Goal: Ability to state activities that reduce stress will improve Outcome: Progressing

## 2023-10-29 NOTE — Plan of Care (Signed)
   Problem: Education: Goal: Emotional status will improve Outcome: Progressing Goal: Mental status will improve Outcome: Progressing

## 2023-10-30 LAB — GLUCOSE, CAPILLARY
Glucose-Capillary: 276 mg/dL — ABNORMAL HIGH (ref 70–99)
Glucose-Capillary: 170 mg/dL — ABNORMAL HIGH (ref 70–99)
Glucose-Capillary: 228 mg/dL — ABNORMAL HIGH (ref 70–99)
Glucose-Capillary: 213 mg/dL — ABNORMAL HIGH (ref 70–99)

## 2023-10-30 MED ORDER — LIDOCAINE 5 % EX PTCH
1.0000 | MEDICATED_PATCH | CUTANEOUS | Status: DC
  Administered 2023-10-30 – 2023-11-01 (×3): 1 via TRANSDERMAL
  Filled 2023-10-30 (×3): qty 1

## 2023-10-30 MED ORDER — NAPROXEN 250 MG PO TABS
250.0000 mg | ORAL_TABLET | Freq: Two times a day (BID) | ORAL | Status: DC
  Filled 2023-10-30: qty 1

## 2023-10-30 MED ORDER — NAPROXEN 250 MG PO TABS
250.0000 mg | ORAL_TABLET | Freq: Two times a day (BID) | ORAL | Status: DC
  Administered 2023-10-30 – 2023-11-01 (×5): 250 mg via ORAL
  Filled 2023-10-30 (×7): qty 1

## 2023-10-30 NOTE — Inpatient Diabetes Management (Signed)
 Inpatient Diabetes Program Recommendations  AACE/ADA: New Consensus Statement on Inpatient Glycemic Control (2015)  Target Ranges:  Prepandial:   less than 140 mg/dL      Peak postprandial:   less than 180 mg/dL (1-2 hours)      Critically ill patients:  140 - 180 mg/dL   Lab Results  Component Value Date   GLUCAP 276 (H) 10/30/2023   HGBA1C 13.9 (H) 09/27/2023    Review of Glycemic Control  Latest Reference Range & Units 10/29/23 16:27 10/29/23 19:47 10/30/23 07:44  Glucose-Capillary 70 - 99 mg/dL 825 (H) 714 (H) 723 (H)   Diabetes history: DM2 Outpatient Diabetes medications: Novolog  10 units TID, Lantus  24 units at bedtime (not taking regularly due to no where to store insulin ),  metformin  1000 mg BID Current orders for Inpatient glycemic control: Semglee  30 units daily, Novolog  0-9 units tid, Novolog  4 units tid with meals, Metformin  500 mg bid  Inpatient Diabetes Program Recommendations:    Consider increasing Semglee  to 35 units daily.   Thanks  Randall Bullocks, RN, BC-ADM Inpatient Diabetes Coordinator Pager [phone]  (8a-5p)

## 2023-10-30 NOTE — Group Note (Signed)

## 2023-10-30 NOTE — Group Note (Signed)
 Recreation Therapy Group Note   Group Topic:General Recreation  Group Date: 10/30/2023 Start Time: 1100 End Time: 1135 Facilitators: Celestia Jeoffrey BRAVO, LRT, CTRS Location: Courtyard  Group Description: Outdoor Recreation. Patients had the option to play corn hole, ring toss, bowling or listening to music while outside in the courtyard getting fresh air and sunlight. Patients helped water and prune the raised garden beds. LRT and patients discussed things that they enjoy doing in their free time outside of the hospital. LRT encouraged patients to drink water after being active and getting their heart rate up.   Goal Area(s) Addressed: Patient will identify leisure interests.  Patient will practice healthy decision making. Patient will engage in recreation activity.   Affect/Mood: Flat   Participation Level: Non-verbal    Clinical Observations/Individualized Feedback: Kingslee was present in group, however did not interact with LRT or peers.   Plan: Continue to engage patient in RT group sessions 2-3x/week.   Jeoffrey BRAVO Celestia, LRT, CTRS 10/30/2023 2:31 PM

## 2023-10-30 NOTE — Group Note (Signed)
 LCSW Group Therapy Note  Group Date: 10/30/2023 Start Time: 1330 End Time: 1400   Type of Therapy and Topic:  Group Therapy - Healthy vs Unhealthy Coping Skills  Participation Level:  Did Not Attend   Description of Group The focus of this group was to determine what unhealthy coping techniques typically are used by group members and what healthy coping techniques would be helpful in coping with various problems. Patients were guided in becoming aware of the differences between healthy and unhealthy coping techniques. Patients were asked to identify 2-3 healthy coping skills they would like to learn to use more effectively.  Therapeutic Goals Patients learned that coping is what human beings do all day long to deal with various situations in their lives Patients defined and discussed healthy vs unhealthy coping techniques Patients identified their preferred coping techniques and identified whether these were healthy or unhealthy Patients determined 2-3 healthy coping skills they would like to become more familiar with and use more often. Patients provided support and ideas to each other   Summary of Patient Progress:  X   Therapeutic Modalities Cognitive Behavioral Therapy Motivational Interviewing  David Wyatt, LCSWA 10/30/2023  1:42 PM

## 2023-10-30 NOTE — Group Note (Signed)
 Date:  10/30/2023 Time:  10:34 AM  Group Topic/Focus:  Movement Therapy    Participation Level:  Did Not Attend    Norleen SHAUNNA Bias 10/30/2023, 10:34 AM

## 2023-10-30 NOTE — Progress Notes (Addendum)
 [facility] MD Progress Note  10/30/2023 9:29 PM David Wyatt  MRN:  [id number]  David Wyatt is a 63 y.o. male admitted: Presented to the ED for 10/22/2023  5:21 PM for diabetic ketoacidoses. He carries the psychiatric diagnoses of depression, stimulant use disorder (cocaine), tobacco use disorder, and has a past medical history of  type 2 diabetes with long term use of insulin  and neuropathy, hypertension, hyperlipidemia, arthritis, and chronic back pain. His current presentation of suicidal ideation is most consistent with major depressive disorder. Subjective:  Chart reviewed, case discussed in multidisciplinary meeting, patient seen during rounds.  On interview patient remains superficial and minimally engaging with the provider.  He demands the provider to check his back.  When provider asked what is going on with his back and to describe the symptoms he remains adamant and wanted the provider to lift his scrubs and look at his back.  Per nursing patient has chronic back issues and he feels pain in that location today.  Naprosyn  was added to help with the pain.  He denies any SI/HI/plan today.  He denies auditory hallucinations and reports having chronic visual hallucinations of seeing some dots.  Patient was encouraged to call the rehab places that he wanted to go and also was updated on the upcoming discharge day. Sleep: Negative  Appetite:  Negative  Past Psychiatric History: see h&P Family History:  Family History  Problem Relation Age of Onset   Diabetes Mother    Kidney disease Mother    Hyperlipidemia Mother    Hyperlipidemia Father    Diabetes Father    Social History:  Social History   Substance and Sexual Activity  Alcohol Use No   Alcohol/week: 0.0 standard drinks of alcohol     Social History   Substance and Sexual Activity  Drug Use Not Currently   Types: Crack cocaine   Comment: last used in July 2025    Social History   Socioeconomic History   Marital status:  Divorced    Spouse name: Not on file   Number of children: 3   Years of education: 12   Highest education level: Not on file  Occupational History    Comment: disabled  Tobacco Use   Smoking status: Every Day    Current packs/day: 0.25    Average packs/day: 0.3 packs/day for 30.0 years (7.5 ttl pk-yrs)    Types: Cigarettes   Smokeless tobacco: Former    Quit date: 08/15/2015   Tobacco comments:    04/29/16 2  cigs daily, 10/27/17 sometimes < .25 PPD  Vaping Use   Vaping status: Never Used  Substance and Sexual Activity   Alcohol use: No    Alcohol/week: 0.0 standard drinks of alcohol   Drug use: Not Currently    Types: Crack cocaine    Comment: last used in July 2025   Sexual activity: Not on file  Other Topics Concern   Not on file  Social History Narrative   04/29/17   Lives in shelter   Caffeine- a lot of  tea, coffee   Social Drivers of Corporate investment banker Strain: Not on file  Food Insecurity: Food Insecurity Present (10/24/2023)   Hunger Vital Sign    Worried About Running Out of Food in the Last Year: Sometimes true    Ran Out of Food in the Last Year: Sometimes true  Transportation Needs: Unmet Transportation Needs (10/24/2023)   PRAPARE - Administrator, Civil Service (Medical): Yes  Lack of Transportation (Non-Medical): Yes  Physical Activity: Not on file  Stress: Not on file  Social Connections: Unknown (10/24/2023)   Social Connection and Isolation Panel    Frequency of Communication with Friends and Family: Once a week    Frequency of Social Gatherings with Friends and Family: Never    Attends Religious Services: Never    Database administrator or Organizations: No    Attends Banker Meetings: Never    Marital Status: Patient unable to answer   Past Medical History:  Past Medical History:  Diagnosis Date   Angioedema of lips 07/28/2012   left upper (07/29/2012)   Arthritis    hands and back   Chronic back pain     Cocaine abuse (HCC)    Diabetic neuropathy (HCC)    High cholesterol    Hypertension    Neuropathy    Type II diabetes mellitus (HCC)     Past Surgical History:  Procedure Laterality Date   BACK SURGERY     CYSTOSCOPY W/ URETERAL STENT PLACEMENT Right 02/23/2021   Procedure: CYSTOSCOPY WITH RETROGRADE PYELOGRAM/URETERAL STENT PLACEMENT;  Surgeon: Selma Donnice SAUNDERS, MD;  Location: [facility];  Service: Urology;  Laterality: Right;   CYSTOSCOPY/URETEROSCOPY/HOLMIUM LASER/STENT PLACEMENT Right 12/05/2022   Procedure: CYSTOSCOPY, RIGHT URETEROSCOPY, AND RIGHT URETERAL STENT PLACEMENT;  Surgeon: Shane Steffan BROCKS, MD;  Location: [facility];  Service: Urology;  Laterality: Right;  90 MINUTES   HERNIA REPAIR Right 04/01/2012   I & D EXTREMITY Left 12/13/2012   Procedure: IRRIGATION AND DEBRIDEMENT LEFT THUMB;  Surgeon: Franky SAUNDERS Curia, MD;  Location: [facility] OR;  Service: Orthopedics;  Laterality: Left;   INCISION AND DRAINAGE OF WOUND     boil on back/notes 07/14/2008 (07/29/2012)   INGUINAL HERNIA REPAIR  04/01/2012   Procedure: HERNIA REPAIR INGUINAL ADULT;  Surgeon: Sherlean JINNY Laughter, MD;  Location: [facility] OR;  Service: General;  Laterality: Right;   INGUINAL HERNIA REPAIR Left 09/06/2016   Procedure: OPEN REPAIR LEFT INGUINAL HERNIA;  Surgeon: Tanda Locus, MD;  Location: [facility] OR;  Service: General;  Laterality: Left;   INSERTION OF MESH Left 09/06/2016   Procedure: INSERTION OF MESH;  Surgeon: Tanda Locus, MD;  Location: [facility] OR;  Service: General;  Laterality: Left;   TONSILLECTOMY      Current Medications: Current Facility-Administered Medications  Medication Dose Route Frequency Provider Last Rate Last Admin   acetaminophen  (TYLENOL ) tablet 650 mg  650 mg Oral Q6H PRN Wilkie Majel RAMAN, FNP   650 mg at 10/30/23 1428   alum & mag hydroxide-simeth (MAALOX/MYLANTA) 200-200-20 MG/5ML suspension 30 mL  30 mL Oral Q4H PRN Wilkie Majel RAMAN, FNP       amLODipine  (NORVASC ) tablet 10 mg  10 mg Oral Daily  Wilkie Majel RAMAN, FNP   10 mg at 10/30/23 9160   atorvastatin  (LIPITOR) tablet 40 mg  40 mg Oral Daily Starkes-Perry, Takia S, FNP   40 mg at 10/30/23 9160   buPROPion  (WELLBUTRIN  XL) 24 hr tablet 300 mg  300 mg Oral Daily Emnet Monk, MD   300 mg at 10/30/23 9160   doxepin  (SINEQUAN ) capsule 25 mg  25 mg Oral QHS Caylin Nass, MD   25 mg at 10/30/23 2125   DULoxetine  (CYMBALTA ) DR capsule 60 mg  60 mg Oral Daily Starkes-Perry, Takia S, FNP   60 mg at 10/30/23 9160   gabapentin  (NEURONTIN ) capsule 300 mg  300 mg Oral TID Starkes-Perry, Takia S, FNP   300 mg  at 10/30/23 2125   hydrOXYzine  (ATARAX ) tablet 10 mg  10 mg Oral TID PRN Starkes-Perry, Takia S, FNP   10 mg at 10/28/23 2109   insulin  aspart (novoLOG ) injection 0-5 Units  0-5 Units Subcutaneous QHS Aaryanna Hyden, MD   2 Units at 10/30/23 2123   insulin  aspart (novoLOG ) injection 0-9 Units  0-9 Units Subcutaneous TID WC Eden Toohey, MD   3 Units at 10/30/23 1653   insulin  aspart (novoLOG ) injection 4 Units  4 Units Subcutaneous TID WC Zayleigh Stroh, MD   4 Units at 10/30/23 1652   insulin  glargine-yfgn (SEMGLEE ) injection 30 Units  30 Units Subcutaneous QHS Domnique Vantine, MD   30 Units at 10/30/23 2124   lidocaine  (LIDODERM ) 5 % 1 patch  1 patch Transdermal Q24H Genie Wenke, MD   1 patch at 10/30/23 1425   magnesium  hydroxide (MILK OF MAGNESIA) suspension 30 mL  30 mL Oral Daily PRN Starkes-Perry, Takia S, FNP   30 mL at 10/30/23 9162   metFORMIN  (GLUCOPHAGE ) tablet 500 mg  500 mg Oral BID WC Madaram, Kondal R, MD   500 mg at 10/30/23 1655   naproxen  (NAPROSYN ) tablet 250 mg  250 mg Oral BID WC Dioselina Brumbaugh, MD   250 mg at 10/30/23 1652   nicotine  (NICODERM CQ  - dosed in mg/24 hours) patch 14 mg  14 mg Transdermal Daily Carmelo Reidel, MD   14 mg at 10/25/23 1015   OLANZapine  (ZYPREXA ) injection 5 mg  5 mg Intramuscular TID PRN Starkes-Perry, Takia S, FNP       OLANZapine  zydis (ZYPREXA ) disintegrating tablet 5 mg   5 mg Oral TID PRN Starkes-Perry, Takia S, FNP       traZODone  (DESYREL ) tablet 50 mg  50 mg Oral QHS PRN Bobbitt, Shalon E, NP   50 mg at 10/29/23 2144    Lab Results:  Results for orders placed or performed during the hospital encounter of 10/24/23 (from the past 48 hours)  Glucose, capillary     Status: Abnormal   Collection Time: 10/29/23  7:41 AM  Result Value Ref Range   Glucose-Capillary 235 (H) 70 - 99 mg/dL    Comment: Glucose reference range applies only to samples taken after fasting for at least 8 hours.  Glucose, capillary     Status: Abnormal   Collection Time: 10/29/23 11:26 AM  Result Value Ref Range   Glucose-Capillary 275 (H) 70 - 99 mg/dL    Comment: Glucose reference range applies only to samples taken after fasting for at least 8 hours.  Glucose, capillary     Status: Abnormal   Collection Time: 10/29/23  4:27 PM  Result Value Ref Range   Glucose-Capillary 174 (H) 70 - 99 mg/dL    Comment: Glucose reference range applies only to samples taken after fasting for at least 8 hours.  Glucose, capillary     Status: Abnormal   Collection Time: 10/29/23  7:47 PM  Result Value Ref Range   Glucose-Capillary 285 (H) 70 - 99 mg/dL    Comment: Glucose reference range applies only to samples taken after fasting for at least 8 hours.  Glucose, capillary     Status: Abnormal   Collection Time: 10/30/23  7:44 AM  Result Value Ref Range   Glucose-Capillary 276 (H) 70 - 99 mg/dL    Comment: Glucose reference range applies only to samples taken after fasting for at least 8 hours.  Glucose, capillary     Status: Abnormal   Collection Time:  10/30/23 11:34 AM  Result Value Ref Range   Glucose-Capillary 170 (H) 70 - 99 mg/dL    Comment: Glucose reference range applies only to samples taken after fasting for at least 8 hours.  Glucose, capillary     Status: Abnormal   Collection Time: 10/30/23  4:39 PM  Result Value Ref Range   Glucose-Capillary 228 (H) 70 - 99 mg/dL    Comment:  Glucose reference range applies only to samples taken after fasting for at least 8 hours.  Glucose, capillary     Status: Abnormal   Collection Time: 10/30/23  7:30 PM  Result Value Ref Range   Glucose-Capillary 213 (H) 70 - 99 mg/dL    Comment: Glucose reference range applies only to samples taken after fasting for at least 8 hours.    Blood Alcohol level:  Lab Results  Component Value Date   [facility] <15 10/22/2023   ETH <15 07/15/2023    Metabolic Disorder Labs: Lab Results  Component Value Date   HGBA1C 13.9 (H) 09/27/2023   MPG 352.23 09/27/2023   MPG 191.51 05/23/2023   Lab Results  Component Value Date   PROLACTIN 6.9 06/19/2022   Lab Results  Component Value Date   CHOL 197 10/23/2023   TRIG 113 10/23/2023   HDL 62 10/23/2023   CHOLHDL 3.2 10/23/2023   VLDL 23 10/23/2023   LDLCALC 112 (H) 10/23/2023   LDLCALC 89 05/18/2023    Physical Findings: AIMS:  , ,  ,  ,    CIWA:    COWS:      Psychiatric Specialty Exam:  Presentation  General Appearance:  Stated age Eye Contact: Fair  Speech: Clear and Coherent  Speech Volume: Normal    Mood and Affect  Mood: Fine Affect: Congruent   Thought Process  Thought Processes: Coherent  Descriptions of Associations:Intact  Orientation:Full (Time, Place and Person)  Thought Content:WDL  Hallucinations: Denies auditory, has some chronic visual of seeing some dots, Ideas of Reference:None  Suicidal Thoughts: Denies Homicidal Thoughts: Denies  Sensorium  Memory: Immediate Good; Recent Good  Judgment: Good  Insight: Good   Executive Functions  Concentration: Fair  Attention Span: Fair  Recall: Fair  Fund of Knowledge: Fair  Language: Fair   Psychomotor Activity  Psychomotor Activity:No data recorded Musculoskeletal: Strength & Muscle Tone: within normal limits Gait & Station: unsteady Assets  Assets: Desire for Improvement; Housing    Physical Exam: Physical  Exam Vitals and nursing note reviewed.    ROS Blood pressure 135/83, pulse 94, temperature 97.6 F (36.4 C), resp. rate 18, height 5' 8 (1.727 m), weight 70.1 kg, SpO2 98%. Body mass index is 23.49 kg/m.  Diagnosis: Principal Problem:   MDD (major depressive disorder), recurrent episode, severe (HCC) Stimulant use disorder severe  PLAN: Safety and Monitoring:  -- Voluntary admission to inpatient psychiatric unit for safety, stabilization and treatment  -- Daily contact with patient to assess and evaluate symptoms and progress in treatment  -- Patient's case to be discussed in multi-disciplinary team meeting  -- Observation Level : q15 minute checks  -- Vital signs:  q12 hours  -- Precautions: suicide, elopement, and assault -- Encouraged patient to participate in unit milieu and in scheduled group therapies  2. Psychiatric Diagnoses and Treatment:    Increased Wellbutrin  XL 300mg   Cymbalta  DR 60 mg daily     3. Medical Issues Being Addressed:   No urgent medical needs noted  4. Discharge Planning:   -- Social work and  case management to assist with discharge planning and identification of hospital follow-up needs prior to discharge  -- Estimated LOS: 3-4 days  Allyn Foil, MD 10/30/2023, 9:29 PM

## 2023-10-30 NOTE — Progress Notes (Signed)
 Tour of Duty:  Eleanor KATHEE Flemings, RN, 10/30/23, Tour of Duty: 0700-1900  SI/HI/AVH: Denies  Self-Reported   Mood: Neutral  Anxiety: Denies Depression: Denies Irritability: Denies  Broset  Violence Prevention Guidelines *See Row Information*: Small Violence Risk interventions implemented   LBM  Last BM Date : 10/29/23   Pain: present, PRN provided (see MAR)  Patient Refusals (including Rx): No  Shift Summary:    Last Vitals  Vitals Weight: 70.1 kg Temp: (!) 100.8 F (38.2 C) Temp Source: Temporal Pulse Rate: 82 Resp: 16 BP: 132/83 Patient Position: (not recorded)  Admission Type  Psych Admission Type (Psych Patients Only) Admission Status: Voluntary Date 72 hour document signed : (not recorded) Time 72 hour document signed : (not recorded) Provider Notified (First and Last Name) (see details for LINK to note): (not recorded)   Psychosocial Assessment  Psychosocial Assessment Patient Complaints: None Eye Contact: Fair Facial Expression: Anxious Affect: Flat Speech: Logical/coherent Interaction: Assertive Motor Activity: Unsteady Appearance/Hygiene: In scrubs Behavior Characteristics: Appropriate to situation Mood: Pleasant   Aggressive Behavior  Targets: (not recorded)   Thought Process  Thought Process Coherency: Within Defined Limits Content: Within Defined Limits Delusions: None reported or observed Perception: Within Defined Limits Hallucination: None reported or observed Judgment: Impaired Confusion: Mild  Danger to Self/Others  Danger to Self Current suicidal ideation?: Denies Description of Suicide Plan: (not recorded) Self-Injurious Behavior: (not recorded) Agreement Not to Harm Self: Yes Description of Agreement: verbal Danger to Others: None reported or observed

## 2023-10-30 NOTE — Plan of Care (Signed)
   Problem: Education: Goal: Emotional status will improve Outcome: Progressing

## 2023-10-31 LAB — GLUCOSE, CAPILLARY
Glucose-Capillary: 69 mg/dL — ABNORMAL LOW (ref 70–99)
Glucose-Capillary: 70 mg/dL (ref 70–99)
Glucose-Capillary: 235 mg/dL — ABNORMAL HIGH (ref 70–99)
Glucose-Capillary: 169 mg/dL — ABNORMAL HIGH (ref 70–99)
Glucose-Capillary: 119 mg/dL — ABNORMAL HIGH (ref 70–99)
Glucose-Capillary: 50 mg/dL — ABNORMAL LOW (ref 70–99)
Glucose-Capillary: 185 mg/dL — ABNORMAL HIGH (ref 70–99)
Glucose-Capillary: 231 mg/dL — ABNORMAL HIGH (ref 70–99)

## 2023-10-31 NOTE — Progress Notes (Addendum)
 [facility] MD Progress Note  10/31/2023 3:38 PM David Wyatt  MRN:  [id number]  David Wyatt is a 63 y.o. male admitted: Presented to the ED for 10/22/2023  5:21 PM for diabetic ketoacidoses. He carries the psychiatric diagnoses of depression, stimulant use disorder (cocaine), tobacco use disorder, and has a past medical history of  type 2 diabetes with long term use of insulin  and neuropathy, hypertension, hyperlipidemia, arthritis, and chronic back pain. His current presentation of suicidal ideation is most consistent with major depressive disorder.  Subjective: Per staff report patient was doing well.  No concerns reported.  Patient has been calm cooperative.  Sleeping okay.  Patient on interview reports that he is not doing well today.  Reports that he started having visual hallucinations since yesterday and he is seeing some things there but is not there.  He denies having any auditory hallucinations.  He consistently denies any suicidal or homicidal thoughts.  I reviewed patient chart seems like patient have reported previous provider also that he was chronically seeing some dots.  Patient insisted that he had this new visual hallucinations today.  Concerns of secondary gain also there.  Will cancel the discharge and continue to monitor.  Sleep: Negative  Appetite:  Negative  Past Psychiatric History: see h&P Family History:  Family History  Problem Relation Age of Onset   Diabetes Mother    Kidney disease Mother    Hyperlipidemia Mother    Hyperlipidemia Father    Diabetes Father    Social History:  Social History   Substance and Sexual Activity  Alcohol Use No   Alcohol/week: 0.0 standard drinks of alcohol     Social History   Substance and Sexual Activity  Drug Use Not Currently   Types: Crack cocaine   Comment: last used in July 2025    Social History   Socioeconomic History   Marital status: Divorced    Spouse name: Not on file   Number of children: 3   Years of  education: 12   Highest education level: Not on file  Occupational History    Comment: disabled  Tobacco Use   Smoking status: Every Day    Current packs/day: 0.25    Average packs/day: 0.3 packs/day for 30.0 years (7.5 ttl pk-yrs)    Types: Cigarettes   Smokeless tobacco: Former    Quit date: 08/15/2015   Tobacco comments:    04/29/16 2  cigs daily, 10/27/17 sometimes < .25 PPD  Vaping Use   Vaping status: Never Used  Substance and Sexual Activity   Alcohol use: No    Alcohol/week: 0.0 standard drinks of alcohol   Drug use: Not Currently    Types: Crack cocaine    Comment: last used in July 2025   Sexual activity: Not on file  Other Topics Concern   Not on file  Social History Narrative   04/29/17   Lives in shelter   Caffeine- a lot of  tea, coffee   Social Drivers of Corporate investment banker Strain: Not on file  Food Insecurity: Food Insecurity Present (10/24/2023)   Hunger Vital Sign    Worried About Running Out of Food in the Last Year: Sometimes true    Ran Out of Food in the Last Year: Sometimes true  Transportation Needs: Unmet Transportation Needs (10/24/2023)   PRAPARE - Administrator, Civil Service (Medical): Yes    Lack of Transportation (Non-Medical): Yes  Physical Activity: Not on file  Stress: Not  on file  Social Connections: Unknown (10/24/2023)   Social Connection and Isolation Panel    Frequency of Communication with Friends and Family: Once a week    Frequency of Social Gatherings with Friends and Family: Never    Attends Religious Services: Never    Database administrator or Organizations: No    Attends Banker Meetings: Never    Marital Status: Patient unable to answer   Past Medical History:  Past Medical History:  Diagnosis Date   Angioedema of lips 07/28/2012   left upper (07/29/2012)   Arthritis    hands and back   Chronic back pain    Cocaine abuse (HCC)    Diabetic neuropathy (HCC)    High cholesterol     Hypertension    Neuropathy    Type II diabetes mellitus (HCC)     Past Surgical History:  Procedure Laterality Date   BACK SURGERY     CYSTOSCOPY W/ URETERAL STENT PLACEMENT Right 02/23/2021   Procedure: CYSTOSCOPY WITH RETROGRADE PYELOGRAM/URETERAL STENT PLACEMENT;  Surgeon: Selma Donnice SAUNDERS, MD;  Location: [facility];  Service: Urology;  Laterality: Right;   CYSTOSCOPY/URETEROSCOPY/HOLMIUM LASER/STENT PLACEMENT Right 12/05/2022   Procedure: CYSTOSCOPY, RIGHT URETEROSCOPY, AND RIGHT URETERAL STENT PLACEMENT;  Surgeon: Shane Steffan BROCKS, MD;  Location: [facility];  Service: Urology;  Laterality: Right;  90 MINUTES   HERNIA REPAIR Right 04/01/2012   I & D EXTREMITY Left 12/13/2012   Procedure: IRRIGATION AND DEBRIDEMENT LEFT THUMB;  Surgeon: Franky SAUNDERS Curia, MD;  Location: [facility] OR;  Service: Orthopedics;  Laterality: Left;   INCISION AND DRAINAGE OF WOUND     boil on back/notes 07/14/2008 (07/29/2012)   INGUINAL HERNIA REPAIR  04/01/2012   Procedure: HERNIA REPAIR INGUINAL ADULT;  Surgeon: Sherlean JINNY Laughter, MD;  Location: [facility] OR;  Service: General;  Laterality: Right;   INGUINAL HERNIA REPAIR Left 09/06/2016   Procedure: OPEN REPAIR LEFT INGUINAL HERNIA;  Surgeon: Tanda Locus, MD;  Location: [facility] OR;  Service: General;  Laterality: Left;   INSERTION OF MESH Left 09/06/2016   Procedure: INSERTION OF MESH;  Surgeon: Tanda Locus, MD;  Location: [facility] OR;  Service: General;  Laterality: Left;   TONSILLECTOMY      Current Medications: Current Facility-Administered Medications  Medication Dose Route Frequency Provider Last Rate Last Admin   acetaminophen  (TYLENOL ) tablet 650 mg  650 mg Oral Q6H PRN Wilkie Majel RAMAN, FNP   650 mg at 10/30/23 1428   alum & mag hydroxide-simeth (MAALOX/MYLANTA) 200-200-20 MG/5ML suspension 30 mL  30 mL Oral Q4H PRN Wilkie Majel RAMAN, FNP       amLODipine  (NORVASC ) tablet 10 mg  10 mg Oral Daily Wilkie Majel RAMAN, FNP   10 mg at 10/31/23 1048   atorvastatin  (LIPITOR)  tablet 40 mg  40 mg Oral Daily Starkes-Perry, Takia S, FNP   40 mg at 10/31/23 1048   buPROPion  (WELLBUTRIN  XL) 24 hr tablet 300 mg  300 mg Oral Daily Jadapalle, Sree, MD   300 mg at 10/31/23 1048   doxepin  (SINEQUAN ) capsule 25 mg  25 mg Oral QHS Jadapalle, Sree, MD   25 mg at 10/30/23 2125   DULoxetine  (CYMBALTA ) DR capsule 60 mg  60 mg Oral Daily Starkes-Perry, Takia S, FNP   60 mg at 10/31/23 1048   gabapentin  (NEURONTIN ) capsule 300 mg  300 mg Oral TID Starkes-Perry, Takia S, FNP   300 mg at 10/31/23 1048   hydrOXYzine  (ATARAX ) tablet 10 mg  10 mg Oral  TID PRN Starkes-Perry, Takia S, FNP   10 mg at 10/28/23 2109   insulin  aspart (novoLOG ) injection 0-5 Units  0-5 Units Subcutaneous QHS Jadapalle, Sree, MD   2 Units at 10/30/23 2123   insulin  aspart (novoLOG ) injection 0-9 Units  0-9 Units Subcutaneous TID WC Jadapalle, Sree, MD   2 Units at 10/31/23 1208   insulin  aspart (novoLOG ) injection 4 Units  4 Units Subcutaneous TID WC Jadapalle, Sree, MD   4 Units at 10/31/23 1213   insulin  glargine-yfgn (SEMGLEE ) injection 30 Units  30 Units Subcutaneous QHS Jadapalle, Sree, MD   30 Units at 10/30/23 2124   lidocaine  (LIDODERM ) 5 % 1 patch  1 patch Transdermal Q24H Jadapalle, Sree, MD   1 patch at 10/30/23 1425   magnesium  hydroxide (MILK OF MAGNESIA) suspension 30 mL  30 mL Oral Daily PRN Starkes-Perry, Takia S, FNP   30 mL at 10/30/23 9162   metFORMIN  (GLUCOPHAGE ) tablet 500 mg  500 mg Oral BID WC Madaram, Kondal R, MD   500 mg at 10/31/23 1048   naproxen  (NAPROSYN ) tablet 250 mg  250 mg Oral BID WC Jadapalle, Sree, MD   250 mg at 10/31/23 1047   nicotine  (NICODERM CQ  - dosed in mg/24 hours) patch 14 mg  14 mg Transdermal Daily Jadapalle, Sree, MD   14 mg at 10/25/23 1015   OLANZapine  (ZYPREXA ) injection 5 mg  5 mg Intramuscular TID PRN Starkes-Perry, Takia S, FNP       OLANZapine  zydis (ZYPREXA ) disintegrating tablet 5 mg  5 mg Oral TID PRN Starkes-Perry, Takia S, FNP       traZODone  (DESYREL )  tablet 50 mg  50 mg Oral QHS PRN Bobbitt, Shalon E, NP   50 mg at 10/29/23 2144    Lab Results:  Results for orders placed or performed during the hospital encounter of 10/24/23 (from the past 48 hours)  Glucose, capillary     Status: Abnormal   Collection Time: 10/29/23  4:27 PM  Result Value Ref Range   Glucose-Capillary 174 (H) 70 - 99 mg/dL    Comment: Glucose reference range applies only to samples taken after fasting for at least 8 hours.  Glucose, capillary     Status: Abnormal   Collection Time: 10/29/23  7:47 PM  Result Value Ref Range   Glucose-Capillary 285 (H) 70 - 99 mg/dL    Comment: Glucose reference range applies only to samples taken after fasting for at least 8 hours.  Glucose, capillary     Status: Abnormal   Collection Time: 10/30/23  7:44 AM  Result Value Ref Range   Glucose-Capillary 276 (H) 70 - 99 mg/dL    Comment: Glucose reference range applies only to samples taken after fasting for at least 8 hours.  Glucose, capillary     Status: Abnormal   Collection Time: 10/30/23 11:34 AM  Result Value Ref Range   Glucose-Capillary 170 (H) 70 - 99 mg/dL    Comment: Glucose reference range applies only to samples taken after fasting for at least 8 hours.  Glucose, capillary     Status: Abnormal   Collection Time: 10/30/23  4:39 PM  Result Value Ref Range   Glucose-Capillary 228 (H) 70 - 99 mg/dL    Comment: Glucose reference range applies only to samples taken after fasting for at least 8 hours.  Glucose, capillary     Status: Abnormal   Collection Time: 10/30/23  7:30 PM  Result Value Ref Range   Glucose-Capillary 213 (H)  70 - 99 mg/dL    Comment: Glucose reference range applies only to samples taken after fasting for at least 8 hours.  Glucose, capillary     Status: Abnormal   Collection Time: 10/31/23  6:23 AM  Result Value Ref Range   Glucose-Capillary 69 (L) 70 - 99 mg/dL    Comment: Glucose reference range applies only to samples taken after fasting for at  least 8 hours.  Glucose, capillary     Status: None   Collection Time: 10/31/23  6:27 AM  Result Value Ref Range   Glucose-Capillary 70 70 - 99 mg/dL    Comment: Glucose reference range applies only to samples taken after fasting for at least 8 hours.  Glucose, capillary     Status: Abnormal   Collection Time: 10/31/23  7:33 AM  Result Value Ref Range   Glucose-Capillary 235 (H) 70 - 99 mg/dL    Comment: Glucose reference range applies only to samples taken after fasting for at least 8 hours.  Glucose, capillary     Status: Abnormal   Collection Time: 10/31/23 11:31 AM  Result Value Ref Range   Glucose-Capillary 169 (H) 70 - 99 mg/dL    Comment: Glucose reference range applies only to samples taken after fasting for at least 8 hours.  Glucose, capillary     Status: Abnormal   Collection Time: 10/31/23  3:05 PM  Result Value Ref Range   Glucose-Capillary 50 (L) 70 - 99 mg/dL    Comment: Glucose reference range applies only to samples taken after fasting for at least 8 hours.  Glucose, capillary     Status: Abnormal   Collection Time: 10/31/23  3:22 PM  Result Value Ref Range   Glucose-Capillary 119 (H) 70 - 99 mg/dL    Comment: Glucose reference range applies only to samples taken after fasting for at least 8 hours.    Blood Alcohol level:  Lab Results  Component Value Date   [facility] <15 10/22/2023   ETH <15 07/15/2023    Metabolic Disorder Labs: Lab Results  Component Value Date   HGBA1C 13.9 (H) 09/27/2023   MPG 352.23 09/27/2023   MPG 191.51 05/23/2023   Lab Results  Component Value Date   PROLACTIN 6.9 06/19/2022   Lab Results  Component Value Date   CHOL 197 10/23/2023   TRIG 113 10/23/2023   HDL 62 10/23/2023   CHOLHDL 3.2 10/23/2023   VLDL 23 10/23/2023   LDLCALC 112 (H) 10/23/2023   LDLCALC 89 05/18/2023    Physical Findings: AIMS:  , ,  ,  ,    CIWA:    COWS:      Psychiatric Specialty Exam:  Presentation  General Appearance:  Stated age Eye  Contact: Fair  Speech: Clear and Coherent  Speech Volume: Normal    Mood and Affect  Mood: Fine Affect: Congruent   Thought Process  Thought Processes: Coherent  Descriptions of Associations:Intact  Orientation:Full (Time, Place and Person)  Thought Content:WDL  Hallucinations: Denies auditory, reports having visual hallucinations Ideas of Reference:None  Suicidal Thoughts: Denies Homicidal Thoughts: Denies  Sensorium  Memory: Immediate Good; Recent Good  Judgment: Good  Insight: Good   Executive Functions  Concentration: Fair  Attention Span: Fair  Recall: Fair  Fund of Knowledge: Fair  Language: Fair   Psychomotor Activity  Psychomotor Activity:No data recorded Musculoskeletal: Strength & Muscle Tone: within normal limits Gait & Station: unsteady Assets  Assets: Desire for Improvement; Housing    Physical Exam: Physical  Exam Vitals and nursing note reviewed.    ROS Blood pressure 119/73, pulse 84, temperature 97.7 F (36.5 C), resp. rate 18, height 5' 8 (1.727 m), weight 70.1 kg, SpO2 96%. Body mass index is 23.49 kg/m.  Diagnosis: Principal Problem:   MDD (major depressive disorder), recurrent episode, severe (HCC) Stimulant use disorder, moderate   PLAN: Safety and Monitoring:  -- Voluntary admission to inpatient psychiatric unit for safety, stabilization and treatment  -- Daily contact with patient to assess and evaluate symptoms and progress in treatment  -- Patient's case to be discussed in multi-disciplinary team meeting  -- Observation Level : q15 minute checks  -- Vital signs:  q12 hours  -- Precautions: suicide, elopement, and assault -- Encouraged patient to participate in unit milieu and in scheduled group therapies  2. Psychiatric Diagnoses and Treatment:   Will discontinue Wellbutrin .  Cymbalta  DR 60 mg daily Patient is also on doxepin .     3. Medical Issues Being Addressed:   No urgent medical  needs noted  4. Discharge Planning:   -- Social work and case management to assist with discharge planning and identification of hospital follow-up needs prior to discharge  -- Estimated LOS: 3-4 days  Desmond Chimera, MD 10/31/2023, 3:38 PM

## 2023-10-31 NOTE — Group Note (Signed)
 Recreation Therapy Group Note   Group Topic:Leisure Education  Group Date: 10/31/2023 Start Time: 1500 End Time: 1550 Facilitators: Celestia Jeoffrey BRAVO, LRT, CTRS Location: Courtyard  Group Description: Outdoor Recreation. Patients had the option to play corn hole, ring toss, bowling or listening to music while outside in the courtyard getting fresh air and sunlight. Patients helped water and prune the raised garden beds. LRT and patients discussed things that they enjoy doing in their free time outside of the hospital. LRT encouraged patients to drink water after being active and getting their heart rate up.   Goal Area(s) Addressed: Patient will identify leisure interests.  Patient will practice healthy decision making. Patient will engage in recreation activity.   Affect/Mood: N/A   Participation Level: Did not attend    Clinical Observations/Individualized Feedback: Patient did not attend group.   Plan: Continue to engage patient in RT group sessions 2-3x/week.   Jeoffrey BRAVO Celestia, LRT, CTRS 10/31/2023 5:36 PM

## 2023-10-31 NOTE — Plan of Care (Signed)
  Problem: Education: Goal: Knowledge of [facility] Education information/materials will improve Outcome: Adequate for Discharge Goal: Emotional status will improve Outcome: Adequate for Discharge Goal: Mental status will improve Outcome: Adequate for Discharge Goal: Verbalization of understanding the information provided will improve Outcome: Adequate for Discharge   Problem: Activity: Goal: Interest or engagement in activities will improve Outcome: Adequate for Discharge Goal: Sleeping patterns will improve Outcome: Adequate for Discharge   Problem: Coping: Goal: Ability to verbalize frustrations and anger appropriately will improve Outcome: Adequate for Discharge Goal: Ability to demonstrate self-control will improve Outcome: Adequate for Discharge   Problem: Health Behavior/Discharge Planning: Goal: Identification of resources available to assist in meeting health care needs will improve Outcome: Adequate for Discharge Goal: Compliance with treatment plan for underlying cause of condition will improve Outcome: Adequate for Discharge   Problem: Physical Regulation: Goal: Ability to maintain clinical measurements within normal limits will improve Outcome: Adequate for Discharge   Problem: Safety: Goal: Periods of time without injury will increase Outcome: Adequate for Discharge   Problem: Education: Goal: Ability to describe self-care measures that may prevent or decrease complications (Diabetes Survival Skills Education) will improve Outcome: Adequate for Discharge Goal: Individualized Educational Video(s) Outcome: Adequate for Discharge   Problem: Coping: Goal: Ability to adjust to condition or change in health will improve Outcome: Adequate for Discharge   Problem: Fluid Volume: Goal: Ability to maintain a balanced intake and output will improve Outcome: Adequate for Discharge   Problem: Health Behavior/Discharge Planning: Goal: Ability to identify and  utilize available resources and services will improve Outcome: Adequate for Discharge Goal: Ability to manage health-related needs will improve Outcome: Adequate for Discharge   Problem: Metabolic: Goal: Ability to maintain appropriate glucose levels will improve Outcome: Adequate for Discharge   Problem: Nutritional: Goal: Maintenance of adequate nutrition will improve Outcome: Adequate for Discharge Goal: Progress toward achieving an optimal weight will improve Outcome: Adequate for Discharge   Problem: Skin Integrity: Goal: Risk for impaired skin integrity will decrease Outcome: Adequate for Discharge   Problem: Tissue Perfusion: Goal: Adequacy of tissue perfusion will improve Outcome: Adequate for Discharge   Problem: Coping: Goal: Coping ability will improve Outcome: Adequate for Discharge   Problem: Education: Goal: Ability to state activities that reduce stress will improve Outcome: Adequate for Discharge

## 2023-10-31 NOTE — Group Note (Signed)
 Physical/Occupational Therapy Group Note  Group Topic: Neurographic Art  Group Date: 10/31/2023 Start Time: 1320 End Time: 1410 Facilitators: Clive Warren CROME, OT   Group Description: Group participated with Neurographic art activity, using watercolor paints to facilitate creative expression and meditation/relaxation for each individual.  Incorporated bimanual coordination, mental focus, emotional processing, task/command following and relaxation techniques as appropriate.  Patients engaged socially with therapist and other group participants throughout session. Allowed to ask questions as appropriate, and encouraged to identify ways they could use/share their creations with themselves and others.  Therapeutic Goal(s):  Demonstrate ability to independently manipulate utensils required to participate with and complete activity. Demonstrate ability to cognitively focus on task and follow commands necessary for completion. Demonstrate use of art as an outlet for emotional processing and expression. Identify and demonstrate importance of relaxation, neural calming and meditation for improved participation with life groups.  Individual Participation: Did not attend.   Participation Level: Did not attend   Participation Quality:   Behavior:   Speech/Thought Process:   Affect/Mood:   Insight:   Judgement:   Modes of Intervention:   Patient Response to Interventions:    Plan: Continue to engage patient in PT/OT groups 1 - 2x/week.  Hinton Luellen R., MPH, MS, OTR/L ascom [phone] 10/31/23, 4:04 PM

## 2023-10-31 NOTE — Plan of Care (Signed)
 David Wyatt is a 63 y.o. male patient. No diagnosis found. Past Medical History:  Diagnosis Date   Angioedema of lips 07/28/2012   left upper (07/29/2012)   Arthritis    hands and back   Chronic back pain    Cocaine abuse (HCC)    Diabetic neuropathy (HCC)    High cholesterol    Hypertension    Neuropathy    Type II diabetes mellitus (HCC)    Current Facility-Administered Medications  Medication Dose Route Frequency Provider Last Rate Last Admin   acetaminophen  (TYLENOL ) tablet 650 mg  650 mg Oral Q6H PRN Starkes-Perry, Takia S, FNP   650 mg at 10/30/23 1428   alum & mag hydroxide-simeth (MAALOX/MYLANTA) 200-200-20 MG/5ML suspension 30 mL  30 mL Oral Q4H PRN Wilkie Majel RAMAN, FNP       amLODipine  (NORVASC ) tablet 10 mg  10 mg Oral Daily Wilkie Majel RAMAN, FNP   10 mg at 10/30/23 9160   atorvastatin  (LIPITOR) tablet 40 mg  40 mg Oral Daily Wilkie Majel RAMAN, FNP   40 mg at 10/30/23 9160   buPROPion  (WELLBUTRIN  XL) 24 hr tablet 300 mg  300 mg Oral Daily Jadapalle, Sree, MD   300 mg at 10/30/23 9160   doxepin  (SINEQUAN ) capsule 25 mg  25 mg Oral QHS Jadapalle, Sree, MD   25 mg at 10/30/23 2125   DULoxetine  (CYMBALTA ) DR capsule 60 mg  60 mg Oral Daily Wilkie Majel RAMAN, FNP   60 mg at 10/30/23 9160   gabapentin  (NEURONTIN ) capsule 300 mg  300 mg Oral TID Starkes-Perry, Takia S, FNP   300 mg at 10/30/23 2125   hydrOXYzine  (ATARAX ) tablet 10 mg  10 mg Oral TID PRN Starkes-Perry, Takia S, FNP   10 mg at 10/28/23 2109   insulin  aspart (novoLOG ) injection 0-5 Units  0-5 Units Subcutaneous QHS Jadapalle, Sree, MD   2 Units at 10/30/23 2123   insulin  aspart (novoLOG ) injection 0-9 Units  0-9 Units Subcutaneous TID WC Jadapalle, Sree, MD   3 Units at 10/30/23 1653   insulin  aspart (novoLOG ) injection 4 Units  4 Units Subcutaneous TID WC Jadapalle, Sree, MD   4 Units at 10/30/23 1652   insulin  glargine-yfgn (SEMGLEE ) injection 30 Units  30 Units Subcutaneous QHS Jadapalle,  Sree, MD   30 Units at 10/30/23 2124   lidocaine  (LIDODERM ) 5 % 1 patch  1 patch Transdermal Q24H Jadapalle, Sree, MD   1 patch at 10/30/23 1425   magnesium  hydroxide (MILK OF MAGNESIA) suspension 30 mL  30 mL Oral Daily PRN Starkes-Perry, Takia S, FNP   30 mL at 10/30/23 9162   metFORMIN  (GLUCOPHAGE ) tablet 500 mg  500 mg Oral BID WC Madaram, Kondal R, MD   500 mg at 10/30/23 1655   naproxen  (NAPROSYN ) tablet 250 mg  250 mg Oral BID WC Jadapalle, Sree, MD   250 mg at 10/30/23 1652   nicotine  (NICODERM CQ  - dosed in mg/24 hours) patch 14 mg  14 mg Transdermal Daily Jadapalle, Sree, MD   14 mg at 10/25/23 1015   OLANZapine  (ZYPREXA ) injection 5 mg  5 mg Intramuscular TID PRN Starkes-Perry, Takia S, FNP       OLANZapine  zydis (ZYPREXA ) disintegrating tablet 5 mg  5 mg Oral TID PRN Starkes-Perry, Takia S, FNP       traZODone  (DESYREL ) tablet 50 mg  50 mg Oral QHS PRN Bobbitt, Shalon E, NP   50 mg at 10/29/23 2144   Allergies  Allergen Reactions  Lisinopril  Other (See Comments) and Swelling    Lip Swelling, angioedema  angioedema  Lip Swelling  Lip Swelling, angioedema   Principal Problem:   MDD (major depressive disorder), recurrent episode, severe (HCC)  Blood pressure 135/83, pulse 94, temperature 97.6 F (36.4 C), resp. rate 18, height 5' 8 (1.727 m), weight 70.1 kg, SpO2 98%.  Subjective Objective Assessment & Plan  Pt. Was med compliant. Confused and woke up during the night. Denied SI/HI during assessment last night.  David Wyatt 10/31/2023

## 2023-10-31 NOTE — Progress Notes (Signed)
   10/31/23 1400  Psych Admission Type (Psych Patients Only)  Admission Status Voluntary  Psychosocial Assessment  Patient Complaints None  Eye Contact Fair  Facial Expression Animated  Affect Flat  Speech Logical/coherent  Interaction Assertive  Motor Activity Unsteady  Appearance/Hygiene In scrubs  Behavior Characteristics Appropriate to situation  Mood Pleasant  Thought Process  Coherency WDL  Content WDL  Delusions None reported or observed  Perception WDL  Hallucination None reported or observed  Judgment Impaired  Confusion Mild  Danger to Self  Current suicidal ideation? Denies  Agreement Not to Harm Self Yes  Description of Agreement verbal  Danger to Others  Danger to Others None reported or observed

## 2023-10-31 NOTE — Progress Notes (Signed)
 Patient OOB to nurses station. disorganized and confused. Speech mumbled. Skin warm and clammy. Blood sugar 70 at 0630. Tremors noted. Pt states 'I feel shaky. Orange juice and crackers provided. NP notified via secure chat. Awaiting call back. Q 15 min checks provided. No s/s acute distress noted. No c/o pain/discomfort noted.

## 2023-10-31 NOTE — Group Note (Signed)
 Date:  10/31/2023 Time:  10:51 AM  Group Topic/Focus:  Fresh air Therapy with music    Participation Level:  Did Not Attend    Norleen SHAUNNA Bias 10/31/2023, 10:51 AM

## 2023-11-01 ENCOUNTER — Emergency Department (HOSPITAL_COMMUNITY)
Admission: EM | Admit: 2023-11-01 | Discharge: 2023-11-02 | Disposition: A | Discharge status: Home / Self Care | Payer: MEDICAID | Attending: Emergency Medicine | Admitting: Emergency Medicine

## 2023-11-01 ENCOUNTER — Encounter (HOSPITAL_COMMUNITY): Payer: Self-pay | Admitting: Emergency Medicine

## 2023-11-01 ENCOUNTER — Other Ambulatory Visit (HOSPITAL_COMMUNITY): Payer: Self-pay

## 2023-11-01 DIAGNOSIS — Z794 Long term (current) use of insulin: Secondary | ICD-10-CM | POA: Insufficient documentation

## 2023-11-01 DIAGNOSIS — R739 Hyperglycemia, unspecified: Secondary | ICD-10-CM

## 2023-11-01 DIAGNOSIS — Z7984 Long term (current) use of oral hypoglycemic drugs: Secondary | ICD-10-CM | POA: Insufficient documentation

## 2023-11-01 DIAGNOSIS — E1165 Type 2 diabetes mellitus with hyperglycemia: Secondary | ICD-10-CM | POA: Diagnosis not present

## 2023-11-01 LAB — I-STAT CHEM 8, ED
BUN: 28 mg/dL — ABNORMAL HIGH (ref 8–23)
Calcium, Ion: 1.17 mmol/L (ref 1.15–1.40)
Chloride: 101 mmol/L (ref 98–111)
Creatinine, Ser: 1.3 mg/dL — ABNORMAL HIGH (ref 0.61–1.24)
Glucose, Bld: 450 mg/dL — ABNORMAL HIGH (ref 70–99)
HCT: 42 % (ref 39.0–52.0)
Hemoglobin: 14.3 g/dL (ref 13.0–17.0)
Potassium: 5.1 mmol/L (ref 3.5–5.1)
Sodium: 136 mmol/L (ref 135–145)
TCO2: 24 mmol/L (ref 22–32)

## 2023-11-01 LAB — URINALYSIS, ROUTINE W REFLEX MICROSCOPIC
Bacteria, UA: NONE SEEN
Bilirubin Urine: NEGATIVE
Glucose, UA: >=500 mg/dL — AB
Hgb urine dipstick: NEGATIVE
Ketones, ur: NEGATIVE mg/dL
Leukocytes,Ua: NEGATIVE
Nitrite: NEGATIVE
Protein, ur: NEGATIVE mg/dL
Specific Gravity, Urine: 1.023 (ref 1.005–1.030)
pH: 5 (ref 5.0–8.0)

## 2023-11-01 LAB — CBG MONITORING, ED
Glucose-Capillary: 584 mg/dL (ref 70–99)
Glucose-Capillary: 374 mg/dL — ABNORMAL HIGH (ref 70–99)

## 2023-11-01 LAB — BASIC METABOLIC PANEL WITH GFR
Anion gap: 13 (ref 5–15)
BUN: 26 mg/dL — ABNORMAL HIGH (ref 8–23)
CO2: 24 mmol/L (ref 22–32)
Calcium: 9.4 mg/dL (ref 8.9–10.3)
Chloride: 98 mmol/L (ref 98–111)
Creatinine, Ser: 1.47 mg/dL — ABNORMAL HIGH (ref 0.61–1.24)
GFR, Estimated: 53 mL/min — ABNORMAL LOW (ref >60)
Glucose, Bld: 461 mg/dL — ABNORMAL HIGH (ref 70–99)
Potassium: 5.2 mmol/L — ABNORMAL HIGH (ref 3.5–5.1)
Sodium: 135 mmol/L (ref 135–145)

## 2023-11-01 LAB — CBC
HCT: 42.8 % (ref 39.0–52.0)
Hemoglobin: 13.7 g/dL (ref 13.0–17.0)
MCH: 31.3 pg (ref 26.0–34.0)
MCHC: 32 g/dL (ref 30.0–36.0)
MCV: 97.7 fL (ref 80.0–100.0)
Platelets: 403 K/uL — ABNORMAL HIGH (ref 150–400)
RBC: 4.38 MIL/uL (ref 4.22–5.81)
RDW: 13.3 % (ref 11.5–15.5)
WBC: 5.4 K/uL (ref 4.0–10.5)
nRBC: 0 % (ref 0.0–0.2)

## 2023-11-01 LAB — GLUCOSE, CAPILLARY
Glucose-Capillary: 104 mg/dL — ABNORMAL HIGH (ref 70–99)
Glucose-Capillary: 238 mg/dL — ABNORMAL HIGH (ref 70–99)

## 2023-11-01 MED ORDER — DEXTROSE 50 % IV SOLN: 0.0000 mL | INTRAVENOUS | Status: DC | PRN

## 2023-11-01 MED ORDER — DOXEPIN HCL 25 MG PO CAPS: 25.0000 mg | ORAL_CAPSULE | Freq: Every day | ORAL | 0 refills | Status: DC

## 2023-11-01 MED ORDER — INSULIN ASPART 100 UNIT/ML IJ SOLN: 0.0000 [IU] | Freq: Three times a day (TID) | INTRAMUSCULAR | 3 refills | Status: DC

## 2023-11-01 MED ORDER — INSULIN ASPART 100 UNIT/ML IJ SOLN
8.0000 [IU] | Freq: Once | INTRAMUSCULAR | Status: AC
  Administered 2023-11-01: 8 [IU] via SUBCUTANEOUS

## 2023-11-01 MED ORDER — ATORVASTATIN CALCIUM 40 MG PO TABS
40.0000 mg | ORAL_TABLET | Freq: Every day | ORAL | 0 refills | Status: DC
  Filled 2023-11-03: qty 90, 90d supply, fill #0

## 2023-11-01 MED ORDER — INSULIN REGULAR(HUMAN) IN NACL 100-0.9 UT/100ML-% IV SOLN: INTRAVENOUS | Status: DC

## 2023-11-01 MED ORDER — METFORMIN HCL 500 MG PO TABS
500.0000 mg | ORAL_TABLET | Freq: Two times a day (BID) | ORAL | 0 refills | Status: DC
  Filled 2023-11-01: qty 60, 30d supply, fill #0

## 2023-11-01 MED ORDER — HYDROXYZINE HCL 10 MG PO TABS: 10.0000 mg | ORAL_TABLET | Freq: Three times a day (TID) | ORAL | 0 refills | Status: DC | PRN

## 2023-11-01 MED ORDER — DOXEPIN HCL 25 MG PO CAPS
25.0000 mg | ORAL_CAPSULE | Freq: Every day | ORAL | 0 refills | Status: DC
  Filled 2023-11-01: qty 30, 30d supply, fill #0

## 2023-11-01 MED ORDER — NICOTINE 7 MG/24HR TD PT24
7.0000 mg | MEDICATED_PATCH | Freq: Every day | TRANSDERMAL | 0 refills | Status: DC | PRN
  Filled 2023-11-03: qty 28, 28d supply, fill #0

## 2023-11-01 MED ORDER — GABAPENTIN 300 MG PO CAPS: 300.0000 mg | ORAL_CAPSULE | Freq: Three times a day (TID) | ORAL | 0 refills | Status: DC

## 2023-11-01 MED ORDER — ONDANSETRON 4 MG PO TBDP
4.0000 mg | ORAL_TABLET | Freq: Three times a day (TID) | ORAL | 0 refills | Status: DC | PRN
  Filled 2023-11-03: qty 30, 10d supply, fill #0

## 2023-11-01 MED ORDER — DULOXETINE HCL 60 MG PO CPEP: 60.0000 mg | ORAL_CAPSULE | Freq: Every day | ORAL | 0 refills | Status: DC

## 2023-11-01 MED ORDER — LIDOCAINE 5 % EX PTCH
1.0000 | MEDICATED_PATCH | CUTANEOUS | 0 refills | Status: DC
Start: 2023-11-01 — End: 2023-12-07

## 2023-11-01 MED ORDER — INSULIN GLARGINE-YFGN 100 UNIT/ML ~~LOC~~ SOLN: 30.0000 [IU] | Freq: Every day | SUBCUTANEOUS | 11 refills | Status: DC

## 2023-11-01 MED ORDER — LIDOCAINE 5 % EX PTCH: 1.0000 | MEDICATED_PATCH | CUTANEOUS | 0 refills | Status: DC

## 2023-11-01 MED ORDER — DULOXETINE HCL 60 MG PO CPEP
60.0000 mg | ORAL_CAPSULE | Freq: Every day | ORAL | 0 refills | Status: DC
  Filled 2023-11-01: qty 30, 30d supply, fill #0

## 2023-11-01 MED ORDER — DEXTROSE IN LACTATED RINGERS 5 % IV SOLN: INTRAVENOUS | Status: DC

## 2023-11-01 MED ORDER — LIDOCAINE 5 % EX PTCH
1.0000 | MEDICATED_PATCH | CUTANEOUS | 0 refills | Status: DC
  Filled 2023-11-01: qty 30, 30d supply, fill #0

## 2023-11-01 MED ORDER — ACCU-CHEK SOFTCLIX LANCETS MISC
1 refills | Status: DC
  Filled 2023-11-03: qty 100, 33d supply, fill #0

## 2023-11-01 MED ORDER — AMLODIPINE BESYLATE 10 MG PO TABS
10.0000 mg | ORAL_TABLET | Freq: Every day | ORAL | 0 refills | Status: DC
  Filled 2023-11-03: qty 90, 90d supply, fill #0

## 2023-11-01 MED ORDER — LACTATED RINGERS IV SOLN: INTRAVENOUS | Status: DC

## 2023-11-01 MED ORDER — METFORMIN HCL 500 MG PO TABS
500.0000 mg | ORAL_TABLET | Freq: Two times a day (BID) | ORAL | 0 refills | Status: DC
  Filled 2023-11-03: qty 60, 30d supply, fill #0

## 2023-11-01 MED ORDER — METFORMIN HCL 500 MG PO TABS
500.0000 mg | ORAL_TABLET | Freq: Two times a day (BID) | ORAL | 0 refills | Status: DC
Start: 2023-11-01 — End: 2023-11-01

## 2023-11-01 MED ORDER — ACCU-CHEK GUIDE TEST VI STRP
ORAL_STRIP | 1 refills | Status: DC
  Filled 2023-11-03: qty 100, 33d supply, fill #0

## 2023-11-01 MED ORDER — ACCU-CHEK GUIDE ME W/DEVICE KIT
PACK | 0 refills | Status: DC
  Filled 2023-11-03: qty 1, 30d supply, fill #0

## 2023-11-01 MED ORDER — INSULIN GLARGINE-YFGN 100 UNIT/ML ~~LOC~~ SOLN
30.0000 [IU] | Freq: Every day | SUBCUTANEOUS | 11 refills | Status: DC
Start: 2023-11-01 — End: 2023-11-01
  Filled 2023-11-01: qty 10, 33d supply, fill #0

## 2023-11-01 NOTE — ED Provider Notes (Signed)
 [facility] AT [facility] Provider Note   CSN: [id number] Arrival date & time: 11/01/23  2008     Patient presents with: Hyperglycemia   David Wyatt is a 63 y.o. male.  {Add pertinent medical, surgical, social history, OB history to HPI:7752} 63 year old male presents with hyperglycemia.  Patient states that he was just discharged from the hospital today at 3:00 this afternoon however is unable to get his medications until Monday and his blood sugar is high.  He states that he is feeling thirsty.  He is not vomiting.  He has no other complaints or concerns at this time.       Prior to Admission medications   Medication Sig Start Date End Date Taking? Authorizing Provider  Accu-Chek Softclix Lancets lancets Use to check blood sugar three times daily as directed 11/01/23   Shrivastava, Aryendra, MD  amLODipine  (NORVASC ) 10 MG tablet Take 1 tablet (10 mg total) by mouth daily. 11/01/23   Shrivastava, Aryendra, MD  atorvastatin  (LIPITOR) 40 MG tablet Take 1 tablet (40 mg total) by mouth daily. 11/01/23   Shrivastava, Aryendra, MD  Blood Glucose Monitoring Suppl (ACCU-CHEK GUIDE) w/Device KIT Use to test blood sugar 3 times daily 11/01/23   Shrivastava, Aryendra, MD  doxepin  (SINEQUAN ) 25 MG capsule Take 1 capsule (25 mg total) by mouth at bedtime. 11/01/23   Shrivastava, Aryendra, MD  DULoxetine  (CYMBALTA ) 60 MG capsule Take 1 capsule (60 mg total) by mouth daily. 11/02/23   Shrivastava, Aryendra, MD  gabapentin  (NEURONTIN ) 300 MG capsule Take 1 capsule (300 mg total) by mouth 3 (three) times daily. 11/01/23   Shrivastava, Aryendra, MD  glucose blood (ACCU-CHEK GUIDE TEST) test strip Use as instructed to check blood sugar three times daily 11/01/23   Shrivastava, Aryendra, MD  hydrOXYzine  (ATARAX ) 10 MG tablet Take 1 tablet (10 mg total) by mouth 3 (three) times daily as needed for anxiety. 11/01/23   Shrivastava, Aryendra, MD  insulin  aspart (NOVOLOG ) 100 UNIT/ML  injection Inject 0-9 Units into the skin 3 (three) times daily with meals. 11/01/23   Shrivastava, Aryendra, MD  insulin  glargine-yfgn (SEMGLEE ) 100 UNIT/ML injection Inject 0.3 mLs (30 Units total) into the skin at bedtime. 11/01/23   Shrivastava, Aryendra, MD  lidocaine  (LIDODERM ) 5 % Place 1 patch onto the skin daily. Remove & Discard patch within 12 hours or as directed by MD 11/01/23   Shrivastava, Aryendra, MD  metFORMIN  (GLUCOPHAGE ) 500 MG tablet Take 1 tablet (500 mg total) by mouth 2 (two) times daily with a meal. 11/01/23   Hellen Becket, MD  nicotine  (NICODERM CQ  - DOSED IN MG/24 HR) 7 mg/24hr patch Place 1 patch (7 mg total) onto the skin daily as needed (nicotine  withdrawal). 11/01/23   Shrivastava, Aryendra, MD  ondansetron  (ZOFRAN -ODT) 4 MG disintegrating tablet Take 1 tablet (4 mg total) by mouth every 8 (eight) hours as needed for nausea or vomiting. 11/01/23   Hellen Becket, MD    Allergies: Lisinopril     Review of Systems Negative except as per HPI Updated Vital Signs BP (!) 128/91 (BP Location: Left Arm)   Pulse 96   Temp 99 F (37.2 C)   Resp 14   SpO2 97%   Physical Exam Vitals and nursing note reviewed.  Constitutional:      General: He is not in acute distress.    Appearance: He is well-developed. He is not diaphoretic.  HENT:     Head: Normocephalic and atraumatic.     Mouth/Throat:  Mouth: Mucous membranes are dry.  Eyes:     Conjunctiva/sclera: Conjunctivae normal.  Cardiovascular:     Rate and Rhythm: Normal rate and regular rhythm.     Heart sounds: Normal heart sounds.  Pulmonary:     Effort: Pulmonary effort is normal.     Breath sounds: Normal breath sounds.  Abdominal:     Palpations: Abdomen is soft.     Tenderness: There is no abdominal tenderness.  Musculoskeletal:     Right lower leg: No edema.     Left lower leg: No edema.  Skin:    General: Skin is warm and dry.     Findings: No erythema or rash.  Neurological:      Mental Status: He is alert and oriented to person, place, and time.  Psychiatric:        Behavior: Behavior normal.     (all labs ordered are listed, but only abnormal results are displayed) Labs Reviewed  CBC - Abnormal; Notable for the following components:      Result Value   Platelets 403 (*)    All other components within normal limits  URINALYSIS, ROUTINE W REFLEX MICROSCOPIC - Abnormal; Notable for the following components:   Glucose, UA >=500 (*)    All other components within normal limits  BASIC METABOLIC PANEL WITH GFR - Abnormal; Notable for the following components:   Potassium 5.2 (*)    Glucose, Bld 461 (*)    BUN 26 (*)    Creatinine, Ser 1.47 (*)    GFR, Estimated 53 (*)    All other components within normal limits  CBG MONITORING, ED - Abnormal; Notable for the following components:   Glucose-Capillary 584 (*)    All other components within normal limits  I-STAT CHEM 8, ED - Abnormal; Notable for the following components:   BUN 28 (*)    Creatinine, Ser 1.30 (*)    Glucose, Bld 450 (*)    All other components within normal limits    EKG: None  Radiology: No results found.  {Document cardiac monitor, telemetry assessment procedure when appropriate:32947} Procedures   Medications Ordered in the ED  insulin  regular, human (MYXREDLIN ) 100 units/ 100 mL infusion (has no administration in time range)  lactated ringers  infusion (has no administration in time range)  dextrose  5 % in lactated ringers  infusion (has no administration in time range)  dextrose  50 % solution 0-50 mL (has no administration in time range)      {Click here for ABCD2, HEART and other calculators REFRESH Note before signing:1}                              Medical Decision Making Amount and/or Complexity of Data Reviewed Labs: ordered.  Risk Prescription drug management.   This patient presents to the ED for concern of hyperglycemia, this involves an extensive number of  treatment options, and is a complaint that carries with it a high risk of complications and morbidity.  The differential diagnosis includes but not limited to medication non compliance, electrolyte/metabolic.    Co morbidities / Chronic conditions that complicate the patient evaluation  Hyper engine, cocaine abuse, diabetes, angioedema, hyperlipidemia, neuropathy   Additional history obtained:  Additional history obtained from EMR External records from outside source obtained and reviewed including prior labs on file   Lab Tests:  I Ordered, and personally interpreted labs.  The pertinent results include: CBC without significant findings.  BMP with slightly elevated potassium at 5.2, stable compared to prior 9 days ago.  Glucose is elevated at 461, creatinine is 1.47, elevated.  Patient is not in DKA.  Glucose positive on urinalysis without signs of infection.  Repeat CBG before intervention has improved to 374.   Imaging Studies ordered:  I ordered imaging studies including ***  I independently visualized and interpreted imaging which showed *** I agree with the radiologist interpretation   Cardiac Monitoring: / EKG:  The patient was maintained on a cardiac monitor.  I personally viewed and interpreted the cardiac monitored which showed an underlying rhythm of: ***   Problem List / ED Course / Critical interventions / Medication management  *** I ordered medication including ***   Reevaluation of the patient after these medicines showed that the patient *** I have reviewed the patients home medicines and have made adjustments as needed   Consultations Obtained:  I requested consultation with the ***,  and discussed lab and imaging findings as well as pertinent plan - they recommend: ***   Social Determinants of Health:  ***   Test / Admission - Considered:  ***   {Document critical care time when appropriate  Document review of labs and clinical decision tools  ie CHADS2VASC2, etc  Document your independent review of radiology images and any outside records  Document your discussion with family members, caretakers and with consultants  Document social determinants of health affecting pt's care  Document your decision making why or why not admission, treatments were needed:32947:::1}   Final diagnoses:  None    ED Discharge Orders     None

## 2023-11-01 NOTE — Plan of Care (Signed)
   Problem: Education: Goal: Knowledge of [place] [facility] Education information/materials will improve Outcome: Progressing Goal: Emotional status will improve Outcome: Progressing Goal: Mental status will improve Outcome: Progressing

## 2023-11-01 NOTE — [facility] Suicide Risk Assessment (Signed)
 [facility] Discharge Suicide Risk Assessment   Principal Problem: MDD (major depressive disorder), recurrent episode, severe (HCC) Discharge Diagnoses: Principal Problem:   MDD (major depressive disorder), recurrent episode, severe [facility])    Psychiatric Specialty Exam  Presentation  General Appearance:  Appropriate for Environment  Eye Contact: Fair  Speech: Normal Rate  Speech Volume: Normal  Handedness: Right   Mood and Affect  Mood: Euthymic  Duration of Depression Symptoms: Greater than two weeks  Affect: Appropriate   Thought Process  Thought Processes: Linear  Descriptions of Associations:Intact  Orientation:Full (Time, Place and Person)  Thought Content:Logical  History of Schizophrenia/Schizoaffective disorder:No  Duration of Psychotic Symptoms:No data recorded Hallucinations:Hallucinations: None  Ideas of Reference:None  Suicidal Thoughts:Suicidal Thoughts: No  Homicidal Thoughts:Homicidal Thoughts: No   Sensorium  Memory: Immediate Good; Recent Good; Remote Good  Judgment: Fair  Insight: Fair   Art therapist  Concentration: Fair  Attention Span: Fair  Recall: [employer] of Knowledge: Fair  Language: Fair   Psychomotor Activity  Psychomotor Activity:Psychomotor Activity: Normal   Assets  Assets: Manufacturing systems engineer; Desire for Improvement   Sleep  Sleep:Sleep: Fair  Estimated Sleeping Duration (Last 24 Hours): 11.00-12.75 hours  Physical Exam: Physical Exam ROS Blood pressure 117/81, pulse 77, temperature (!) 97.2 F (36.2 C), resp. rate 15, height 5' 8 (1.727 m), weight 70.1 kg, SpO2 97%. Body mass index is 23.49 kg/m.  Mental Status Per Nursing Assessment::   On Admission:  NA  Demographic Factors:  Low socioeconomic status, Living alone, and Unemployed  Loss Factors: Loss of significant relationship and Financial problems/change in socioeconomic status  Historical Factors: NA  Risk  Reduction Factors:   Religious beliefs about death and Positive coping skills or problem solving skills  Continued Clinical Symptoms:  Alcohol/Substance Abuse/Dependencies Previous Psychiatric Diagnoses and Treatments  Cognitive Features That Contribute To Risk:  None    Suicide Risk:  Minimal: No identifiable suicidal ideation.  Patients presenting with no risk factors but with morbid ruminations; may be classified as minimal risk based on the severity of the depressive symptoms   Follow-up Information     [facility] Follow up.   Why: [name] scheduled for 11/05/23 at 9:30am and it will be a virtual appointment. Contact information: [address]  [address] [place] [address] [phone]                 Plan Of Care/Follow-up recommendations:  Follow up recommendations: # It is recommended to the patient to continue psychiatric medications as prescribed, after discharge from the hospital.   # It is recommended to the patient to follow up with your outpatient psychiatric provider and PCP. # It was discussed with the patient, the impact of alcohol, drugs, tobacco have been there overall psychiatric and medical wellbeing, and total abstinence from substance use was recommended. # Prescriptions provided or sent directly to preferred pharmacy at discharge. Patient agreeable to plan. Given the opportunity to ask questions. Appears to feel comfortable with discharge.  # In the event of worsening symptoms, the patient is instructed to call the crisis hotline (988), 911 and or go to the nearest ED for appropriate evaluation and treatment of symptoms. To follow-up with primary care provider for other medical issues, concerns and or health care needs  Desmond Chimera, MD 11/01/2023, 10:16 AM

## 2023-11-01 NOTE — ED Triage Notes (Signed)
 Pt here with c/o hyperglycemia , pt states that he has been unabl eto get some of his meds , homeless at present , has been seen for same recently

## 2023-11-01 NOTE — Group Note (Signed)
 Date:  11/01/2023 Time:  10:59 AM  Group Topic/Focus:  Fresh air Therapy with music    Participation Level:  Did Not Attend    Norleen SHAUNNA Bias 11/01/2023, 10:59 AM

## 2023-11-01 NOTE — Progress Notes (Addendum)
 Patient was discharged from [facility] unit escorted by staff. Patient denies SI/HI/AVH. Discharge packet to include printed AVS, printed prescriptions, Suicide Risk Assessment, and Transition Record reviewed with patient. Belongings to include black wallet and rollator walker returned. Pt declined to complete Suicide Safety Plan.  This Clinical research associate spoke with Graig the charge nurse at [facility] regarding a bag of belongings that the patient says is there. Per [facility], this matter was escalated and an incident report conducted when patient was admitted. This Clinical research associate gave patient the phone number to [facility] so that he is able to follow up accordingly.

## 2023-11-01 NOTE — Progress Notes (Signed)
 Charting completed according to this writer's observation.  Writer arrived when patient was resting in bed with closed eyes.    11/01/23 0200  Psych Admission Type (Psych Patients Only)  Admission Status Voluntary  Psychosocial Assessment  Patient Complaints None  Eye Contact Other (Comment) (reating in bed with closed eyes)  Facial Expression Other (Comment)  Affect Other (Comment)  Speech Other (Comment)  Interaction Other (Comment)  Motor Activity Other (Comment)  Appearance/Hygiene In scrubs  Behavior Characteristics Other (Comment) (resting in bed with closed eyes)  Mood Other (Comment)  Thought Process  Coherency Other (Comment)  Content Other (Comment)  Delusions None reported or observed  Hallucination None reported or observed  Confusion None

## 2023-11-01 NOTE — Discharge Summary (Signed)
 Physician Discharge Summary Note  Patient:  David Wyatt is an 63 y.o., male MRN:  [id number] DOB:  05/15/1960 Patient phone:  [phone] (home)  Patient address:   [place] [address],  Total Time spent with patient: 30 minutes  Date of Admission:  10/24/2023 Date of Discharge: 11/01/2023  Reason for Admission: Depression and suicidal thoughts  Principal Problem: MDD (major depressive disorder), recurrent episode, severe (HCC) Discharge Diagnoses: Principal Problem:   MDD (major depressive disorder), recurrent episode, severe (HCC)   Past Psychiatric History:  Psychiatric History:  Information collected from Patient   Prev Dx/Sx: cocaine use, depression Current Psych Provider: none Home Meds (current): Duloxetine , gabapentin  Previous Med Trials: unknown Therapy: none   Prior Psych Hospitalization: 2024, March 2025 at [facility]  Prior Self Harm: denies Prior Violence: denies   Family Psych History: denies Family Hx suicide: denies   Social History:  Developmental Hx: normal Educational Hx: 12th Occupational Hx: unemployed Armed forces operational officer Hx: denies Living Situation: homeless Spiritual Hx: denies Access to weapons/lethal means: denies    Substance History Alcohol: denies    Tobacco: 4-5 cigg/day Illicit drugs: crack cocaine- for many years, alst use- few days ago, $60 worth Prescription drug abuse: denies Rehab hx: denies  Past Medical History:  Past Medical History:  Diagnosis Date   Angioedema of lips 07/28/2012   left upper (07/29/2012)   Arthritis    hands and back   Chronic back pain    Cocaine abuse (HCC)    Diabetic neuropathy (HCC)    High cholesterol    Hypertension    Neuropathy    Type II diabetes mellitus (HCC)     Past Surgical History:  Procedure Laterality Date   BACK SURGERY     CYSTOSCOPY W/ URETERAL STENT PLACEMENT Right 02/23/2021   Procedure: CYSTOSCOPY WITH RETROGRADE PYELOGRAM/URETERAL STENT PLACEMENT;  Surgeon: Selma Donnice SAUNDERS, MD;   Location: [facility] ORS;  Service: Urology;  Laterality: Right;   CYSTOSCOPY/URETEROSCOPY/HOLMIUM LASER/STENT PLACEMENT Right 12/05/2022   Procedure: CYSTOSCOPY, RIGHT URETEROSCOPY, AND RIGHT URETERAL STENT PLACEMENT;  Surgeon: Shane Steffan BROCKS, MD;  Location: [facility];  Service: Urology;  Laterality: Right;  90 MINUTES   HERNIA REPAIR Right 04/01/2012   I & D EXTREMITY Left 12/13/2012   Procedure: IRRIGATION AND DEBRIDEMENT LEFT THUMB;  Surgeon: Franky SAUNDERS Curia, MD;  Location: [facility] OR;  Service: Orthopedics;  Laterality: Left;   INCISION AND DRAINAGE OF WOUND     boil on back/notes 07/14/2008 (07/29/2012)   INGUINAL HERNIA REPAIR  04/01/2012   Procedure: HERNIA REPAIR INGUINAL ADULT;  Surgeon: Sherlean JINNY Laughter, MD;  [facility];  Service: General;  Laterality: Right;   INGUINAL HERNIA REPAIR Left 09/06/2016   Procedure: OPEN REPAIR LEFT INGUINAL HERNIA;  Surgeon: Tanda Locus, MD;  Location: [facility] OR;  Service: General;  Laterality: Left;   INSERTION OF MESH Left 09/06/2016   Procedure: INSERTION OF MESH;  Surgeon: Tanda Locus, MD;  Location: [facility] OR;  Service: General;  Laterality: Left;   TONSILLECTOMY     Family History:  Family History  Problem Relation Age of Onset   Diabetes Mother    Kidney disease Mother    Hyperlipidemia Mother    Hyperlipidemia Father    Diabetes Father     Social History:  Social History   Substance and Sexual Activity  Alcohol Use No   Alcohol/week: 0.0 standard drinks of alcohol     Social History   Substance and Sexual Activity  Drug Use Not Currently   Types:  Crack cocaine   Comment: last used in July 2025    Social History   Socioeconomic History   Marital status: Divorced    Spouse name: Not on file   Number of children: 3   Years of education: 12   Highest education level: Not on file  Occupational History    Comment: disabled  Tobacco Use   Smoking status: Every Day    Current packs/day: 0.25    Average packs/day: 0.3 packs/day for 30.0  years (7.5 ttl pk-yrs)    Types: Cigarettes   Smokeless tobacco: Former    Quit date: 08/15/2015   Tobacco comments:    04/29/16 2  cigs daily, 10/27/17 sometimes < .25 PPD  Vaping Use   Vaping status: Never Used  Substance and Sexual Activity   Alcohol use: No    Alcohol/week: 0.0 standard drinks of alcohol   Drug use: Not Currently    Types: Crack cocaine    Comment: last used in July 2025   Sexual activity: Not on file  Other Topics Concern   Not on file  Social History Narrative   04/29/17   Lives in shelter   Caffeine- a lot of  tea, coffee   Social Drivers of Corporate investment banker Strain: Not on file  Food Insecurity: Food Insecurity Present (10/24/2023)   Hunger Vital Sign    Worried About Running Out of Food in the Last Year: Sometimes true    Ran Out of Food in the Last Year: Sometimes true  Transportation Needs: Unmet Transportation Needs (10/24/2023)   PRAPARE - Administrator, Civil Service (Medical): Yes    Lack of Transportation (Non-Medical): Yes  Physical Activity: Not on file  Stress: Not on file  Social Connections: Unknown (10/24/2023)   Social Connection and Isolation Panel    Frequency of Communication with Friends and Family: Once a week    Frequency of Social Gatherings with Friends and Family: Never    Attends Religious Services: Never    Database administrator or Organizations: No    Attends Banker Meetings: Never    Marital Status: Patient unable to answer    Hospital Course:  David Wyatt is a 62 y.o. male admitted: Presented to the ED for 10/22/2023  5:21 PM for diabetic ketoacidoses. He carries the psychiatric diagnoses of depression, stimulant use disorder (cocaine), tobacco use disorder, and has a past medical history of  type 2 diabetes with long term use of insulin  and neuropathy, hypertension, hyperlipidemia, arthritis, and chronic back pain. His current presentation of suicidal ideation is most consistent with  major depressive disorder. Patient was admitted and treated with biopsychosocial treatment plan.  He received individual, group, recreational and medication therapy including case management to coordinate his inpatient and outpatient care.  Discharge planning was initiated on the day of admission to ensure a safe discharge.  The presenting symptoms were closely monitored and medications were started as indicated.  Patient on initial interview and reported that he had suicidal ideations which led to the ED visit and further admission.  Continues to report of feeling depressed and using crack cocaine.  Patient was admitted to behavioral unit and was started on Wellbutrin , Cymbalta  and continued patient on doxepin .  Doxepin  was increased from 10 mg to 25 mg to help with sleep and depression.  Patient later told that he was having visual hallucinations and to minimize risk of serotonin syndrome we discontinued patient Wellbutrin .  By the time  of discharge patient was on Cymbalta  and doxepin .  Patient consistently denied any suicidal homicidal ideations before discharge.  Over the course of hospitalization marked improvement is noticed and the patient presenting symptoms, noted subjectively and objectively by the patient and the other staff members.  Medications addressing the principal problem were initiated with improvement in severity sufficient to discharge to a lower level of care. Patient was noted to be Calm cooperative, able to perform his own ADLs, was able to interact well with staff members and peers.    The risks and benefits of medications were discussed with the patient or guardian prior to initiation of any medications, all these medications were titrated to discharge levels see medication list below, patient tolerated these medications as about and no side effects are noted at this time including tremor akathisia or tardive dyskinesia.  Patient showed slow but steady and sustained symptomatic  improvement before discharge.  He reports significant improvement in his mood and depression denied any hallucinations suicidal and homicidal thoughts.  Psychoeducation was provided.  A comprehensive risk assessment was done prior to discharge and shows that patient is at low risk for suicide or violence and will continue to be if he complies with the treatment recommendations medications and therapy. No depression psychosis suicidal or homicidal ideation reported at this time.  Physical Findings: AIMS:  , ,  ,  ,  ,  ,   CIWA:    COWS:     Musculoskeletal: Strength & Muscle Tone: within normal limits Gait & Station: normal Patient leans: N/A   Psychiatric Specialty Exam:  Presentation  General Appearance:  Appropriate for Environment  Eye Contact: Fair  Speech: Normal Rate  Speech Volume: Normal  Handedness: Right   Mood and Affect  Mood: Euthymic  Affect: Appropriate   Thought Process  Thought Processes: Linear  Descriptions of Associations:Intact  Orientation:Full (Time, Place and Person)  Thought Content:Logical  History of Schizophrenia/Schizoaffective disorder:No  Duration of Psychotic Symptoms:No data recorded Hallucinations:Hallucinations: None  Ideas of Reference:None  Suicidal Thoughts:Suicidal Thoughts: No  Homicidal Thoughts:Homicidal Thoughts: No   Sensorium  Memory: Immediate Good; Recent Good; Remote Good  Judgment: Fair  Insight: Fair   Art therapist  Concentration: Fair  Attention Span: Fair  Recall: [employer] of Knowledge: Fair  Language: Fair   Psychomotor Activity  Psychomotor Activity: Psychomotor Activity: Normal   Assets  Assets: Communication Skills; Desire for Improvement   Sleep  Sleep: Sleep: Fair  Estimated Sleeping Duration (Last 24 Hours): 11.00-12.75 hours   Physical Exam: Physical Exam ROS Blood pressure 117/81, pulse 77, temperature (!) 97.2 F (36.2 C), resp. rate  15, height 5' 8 (1.727 m), weight 70.1 kg, SpO2 97%. Body mass index is 23.49 kg/m.   Social History   Tobacco Use  Smoking Status Every Day   Current packs/day: 0.25   Average packs/day: 0.3 packs/day for 30.0 years (7.5 ttl pk-yrs)   Types: Cigarettes  Smokeless Tobacco Former   Quit date: 08/15/2015  Tobacco Comments   04/29/16 2  cigs daily, 10/27/17 sometimes < .25 PPD   Tobacco Cessation:  A prescription for an FDA-approved tobacco cessation medication provided at discharge   Blood Alcohol level:  Lab Results  Component Value Date   [facility] <15 10/22/2023   ETH <15 07/15/2023    Metabolic Disorder Labs:  Lab Results  Component Value Date   HGBA1C 13.9 (H) 09/27/2023   MPG 352.23 09/27/2023   MPG 191.51 05/23/2023   Lab Results  Component  Value Date   PROLACTIN 6.9 06/19/2022   Lab Results  Component Value Date   CHOL 197 10/23/2023   TRIG 113 10/23/2023   HDL 62 10/23/2023   CHOLHDL 3.2 10/23/2023   VLDL 23 10/23/2023   LDLCALC 112 (H) 10/23/2023   LDLCALC 89 05/18/2023    See Psychiatric Specialty Exam and Suicide Risk Assessment completed by Attending Physician prior to discharge.  Discharge destination:  Other:  Shelter  Is patient on multiple antipsychotic therapies at discharge:  No   Has Patient had three or more failed trials of antipsychotic monotherapy by history:  No  Recommended Plan for Multiple Antipsychotic Therapies: NA   Allergies as of 11/01/2023       Reactions   Lisinopril  Other (See Comments), Swelling   Lip Swelling, angioedema angioedema  Lip Swelling  Lip Swelling, angioedema        Medication List     TAKE these medications      Indication  Accu-Chek Guide Test test strip Generic drug: glucose blood Use as instructed to check blood sugar three times daily    Accu-Chek Guide w/Device Kit Use to test blood sugar 3 times daily    Accu-Chek Softclix Lancets lancets Use to check blood sugar three times daily as  directed    amLODipine  10 MG tablet Commonly known as: NORVASC  Take 1 tablet (10 mg total) by mouth daily.  Indication: High Blood Pressure   atorvastatin  40 MG tablet Commonly known as: LIPITOR Take 1 tablet (40 mg total) by mouth daily.  Indication: High Amount of Fats in the Blood   doxepin  25 MG capsule Commonly known as: SINEQUAN  Take 1 capsule (25 mg total) by mouth at bedtime. What changed:  medication strength how much to take when to take this reasons to take this  Indication: Depression   DULoxetine  60 MG capsule Commonly known as: CYMBALTA  Take 1 capsule (60 mg total) by mouth daily. Start taking on: November 02, 2023    gabapentin  300 MG capsule Commonly known as: NEURONTIN  Take 1 capsule (300 mg total) by mouth 3 (three) times daily.    hydrOXYzine  10 MG tablet Commonly known as: ATARAX  Take 1 tablet (10 mg total) by mouth 3 (three) times daily as needed for anxiety.    insulin  aspart 100 UNIT/ML injection Commonly known as: novoLOG  Inject 0-9 Units into the skin 3 (three) times daily with meals.    insulin  glargine-yfgn 100 UNIT/ML injection Commonly known as: SEMGLEE  Inject 0.3 mLs (30 Units total) into the skin at bedtime. What changed: how much to take    lidocaine  5 % Commonly known as: LIDODERM  Place 1 patch onto the skin daily. Remove & Discard patch within 12 hours or as directed by MD    metFORMIN  500 MG tablet Commonly known as: GLUCOPHAGE  Take 1 tablet (500 mg total) by mouth 2 (two) times daily with a meal.    nicotine  7 mg/24hr patch Commonly known as: NICODERM CQ  - dosed in mg/24 hr Place 1 patch (7 mg total) onto the skin daily as needed (nicotine  withdrawal).    ondansetron  4 MG disintegrating tablet Commonly known as: ZOFRAN -ODT Take 1 tablet (4 mg total) by mouth every 8 (eight) hours as needed for nausea or vomiting.         Follow-up Information     [facility] Follow up.   Why: [name] scheduled for 11/05/23 at 9:30am  and it will be a virtual appointment. Contact information: [address]  [address]  [place] [address] [phone]                 Follow-up recommendations:   Follow up recommendations: # It is recommended to the patient to continue psychiatric medications as prescribed, after discharge from the hospital.   # It is recommended to the patient to follow up with your outpatient psychiatric provider and PCP. # It was discussed with the patient, the impact of alcohol, drugs, tobacco have been there overall psychiatric and medical wellbeing, and total abstinence from substance use was recommended. # Prescriptions provided or sent directly to preferred pharmacy at discharge. Patient agreeable to plan. Given the opportunity to ask questions. Appears to feel comfortable with discharge.  # In the event of worsening symptoms, the patient is instructed to call the crisis hotline (988), 911 and or go to the nearest ED for appropriate evaluation and treatment of symptoms. To follow-up with primary care provider for other medical issues, concerns and or health care needs   Signed: Desmond Chimera, MD 11/01/2023, 10:20 AM

## 2023-11-01 NOTE — Progress Notes (Signed)
  [facility] Adult Case Management Discharge Plan :  Will you be returning to the same living situation after discharge:  Yes,  pt will go to the [facility]  At discharge, do you have transportation home?: Yes,  CSW will assist with transportation  Do you have the ability to pay for your medications: Yes,  [employer] TAILORED PLAN / [employer] TAILORED PLAN  Release of information consent forms completed and in the chart;  Patient's signature needed at discharge.  Patient to Follow up at:  Follow-up Information     [facility] Follow up.   Why: David Wyatt scheduled for 11/05/23 at 9:30am and it will be a virtual appointment. Contact information: [address]  [address] [place] [address] [phone]                 Next level of care provider has access to [facility] Link:no  Safety Planning and Suicide Prevention discussed: Yes,  SPE gone over with ot      Has patient been referred to the Quitline?: Patient refused referral for treatment  Patient has been referred for addiction treatment: Patient refused referral for treatment. Pt did not follow-up on referrals made to treatment by CSW  David Wyatt, LCSWA 11/01/2023, 10:03 AM

## 2023-11-02 ENCOUNTER — Other Ambulatory Visit: Payer: Self-pay

## 2023-11-02 ENCOUNTER — Emergency Department (HOSPITAL_COMMUNITY)
Admission: EM | Admit: 2023-11-02 | Discharge: 2023-11-02 | Disposition: A | Discharge status: Home / Self Care | Payer: MEDICAID | Attending: Emergency Medicine | Admitting: Emergency Medicine

## 2023-11-02 ENCOUNTER — Encounter (HOSPITAL_COMMUNITY): Payer: Self-pay

## 2023-11-02 DIAGNOSIS — Z794 Long term (current) use of insulin: Secondary | ICD-10-CM | POA: Insufficient documentation

## 2023-11-02 DIAGNOSIS — R739 Hyperglycemia, unspecified: Secondary | ICD-10-CM | POA: Insufficient documentation

## 2023-11-02 LAB — CBG MONITORING, ED
Glucose-Capillary: 161 mg/dL — ABNORMAL HIGH (ref 70–99)
Glucose-Capillary: 185 mg/dL — ABNORMAL HIGH (ref 70–99)
Glucose-Capillary: 403 mg/dL — ABNORMAL HIGH (ref 70–99)

## 2023-11-02 LAB — BASIC METABOLIC PANEL WITH GFR
Anion gap: 13 (ref 5–15)
Anion gap: 9 (ref 5–15)
BUN: 21 mg/dL (ref 8–23)
BUN: 20 mg/dL (ref 8–23)
CO2: 23 mmol/L (ref 22–32)
CO2: 24 mmol/L (ref 22–32)
Calcium: 9.1 mg/dL (ref 8.9–10.3)
Calcium: 9.2 mg/dL (ref 8.9–10.3)
Chloride: 101 mmol/L (ref 98–111)
Chloride: 101 mmol/L (ref 98–111)
Creatinine, Ser: 1.2 mg/dL (ref 0.61–1.24)
Creatinine, Ser: 1.37 mg/dL — ABNORMAL HIGH (ref 0.61–1.24)
GFR, Estimated: >60 mL/min (ref >60)
GFR, Estimated: 58 mL/min — ABNORMAL LOW (ref >60)
Glucose, Bld: 111 mg/dL — ABNORMAL HIGH (ref 70–99)
Glucose, Bld: 402 mg/dL — ABNORMAL HIGH (ref 70–99)
Potassium: 4.3 mmol/L (ref 3.5–5.1)
Potassium: 4.6 mmol/L (ref 3.5–5.1)
Sodium: 137 mmol/L (ref 135–145)
Sodium: 134 mmol/L — ABNORMAL LOW (ref 135–145)

## 2023-11-02 LAB — CBC WITH DIFFERENTIAL/PLATELET
Abs Immature Granulocytes: 0.04 K/uL (ref 0.00–0.07)
Basophils Absolute: 0.1 K/uL (ref 0.0–0.1)
Basophils Relative: 1 %
Eosinophils Absolute: 0.3 K/uL (ref 0.0–0.5)
Eosinophils Relative: 6 %
HCT: 40 % (ref 39.0–52.0)
Hemoglobin: 12.8 g/dL — ABNORMAL LOW (ref 13.0–17.0)
Immature Granulocytes: 1 %
Lymphocytes Relative: 30 %
Lymphs Abs: 1.6 K/uL (ref 0.7–4.0)
MCH: 31.2 pg (ref 26.0–34.0)
MCHC: 32 g/dL (ref 30.0–36.0)
MCV: 97.6 fL (ref 80.0–100.0)
Monocytes Absolute: 0.6 K/uL (ref 0.1–1.0)
Monocytes Relative: 12 %
Neutro Abs: 2.6 K/uL (ref 1.7–7.7)
Neutrophils Relative %: 50 %
Platelets: 372 K/uL (ref 150–400)
RBC: 4.1 MIL/uL — ABNORMAL LOW (ref 4.22–5.81)
RDW: 13.2 % (ref 11.5–15.5)
WBC: 5.2 K/uL (ref 4.0–10.5)
nRBC: 0 % (ref 0.0–0.2)

## 2023-11-02 LAB — I-STAT VENOUS BLOOD GAS, ED
Acid-Base Excess: 1 mmol/L (ref 0.0–2.0)
Bicarbonate: 26.2 mmol/L (ref 20.0–28.0)
Calcium, Ion: 1.18 mmol/L (ref 1.15–1.40)
HCT: 39 % (ref 39.0–52.0)
Hemoglobin: 13.3 g/dL (ref 13.0–17.0)
O2 Saturation: 91 %
Potassium: 4.8 mmol/L (ref 3.5–5.1)
Sodium: 135 mmol/L (ref 135–145)
TCO2: 27 mmol/L (ref 22–32)
pCO2, Ven: 42.3 mmHg — ABNORMAL LOW (ref 44–60)
pH, Ven: 7.4 (ref 7.25–7.43)
pO2, Ven: 62 mmHg — ABNORMAL HIGH (ref 32–45)

## 2023-11-02 LAB — BETA-HYDROXYBUTYRIC ACID: Beta-Hydroxybutyric Acid: 0.12 mmol/L (ref 0.05–0.27)

## 2023-11-02 MED ORDER — SODIUM CHLORIDE 0.9 % IV BOLUS
1000.0000 mL | Freq: Once | INTRAVENOUS | Status: AC
  Administered 2023-11-02: 1000 mL via INTRAVENOUS

## 2023-11-02 MED ORDER — INSULIN ASPART 100 UNIT/ML IJ SOLN: 10.0000 [IU] | Freq: Once | INTRAMUSCULAR | Status: DC

## 2023-11-02 MED ORDER — INSULIN ASPART 100 UNIT/ML IJ SOLN: 15.0000 [IU] | Freq: Once | INTRAMUSCULAR | Status: DC

## 2023-11-02 MED ORDER — INSULIN ASPART 100 UNIT/ML IJ SOLN
8.0000 [IU] | Freq: Once | INTRAMUSCULAR | Status: AC
  Administered 2023-11-02: 8 [IU] via INTRAVENOUS

## 2023-11-02 NOTE — ED Notes (Signed)
 Pt discharged. Pt given discharge papers and papers explained. Pt in NAD at this time

## 2023-11-02 NOTE — ED Triage Notes (Signed)
 Pt bib ems c/o hyperglycemia. Pt has a hard time getting medication d/t financial situation. Pt is presenting with malaise.  Pt received 150 NS fluids  BP 144/90 HR 70 RA 95% CBG 500

## 2023-11-02 NOTE — ED Provider Notes (Signed)
 [place] EMERGENCY DEPARTMENT AT [facility] Provider Note   CSN: [id number] Arrival date & time: 11/02/23  1413    Patient presents with: Hyperglycemia  David Wyatt is a 63 y.o. male presents for hyperglycemia.  States he was just discharged from the ED after being seen for hyperglycemia as well.  Apparently prior to that he was admitted to behavioral health.  He had his medications filled however he was not able to pick them up until he opened on Monday.  He states his blood sugars been high.  He feels thirsty.  No headache, nausea, vomiting, abdominal pain.  He states he is hungry.  No chest pain, shortness of breath, no other concerns.  Denies SI, HI, AVH   HPI     Prior to Admission medications   Medication Sig Start Date End Date Taking? Authorizing Provider  Accu-Chek Softclix Lancets lancets Use to check blood sugar three times daily as directed 11/01/23   Shrivastava, Aryendra, MD  amLODipine  (NORVASC ) 10 MG tablet Take 1 tablet (10 mg total) by mouth daily. 11/01/23   Shrivastava, Aryendra, MD  atorvastatin  (LIPITOR) 40 MG tablet Take 1 tablet (40 mg total) by mouth daily. 11/01/23   Shrivastava, Aryendra, MD  Blood Glucose Monitoring Suppl (ACCU-CHEK GUIDE) w/Device KIT Use to test blood sugar 3 times daily 11/01/23   Shrivastava, Aryendra, MD  doxepin  (SINEQUAN ) 25 MG capsule Take 1 capsule (25 mg total) by mouth at bedtime. 11/01/23   Shrivastava, Aryendra, MD  DULoxetine  (CYMBALTA ) 60 MG capsule Take 1 capsule (60 mg total) by mouth daily. 11/02/23   Shrivastava, Aryendra, MD  gabapentin  (NEURONTIN ) 300 MG capsule Take 1 capsule (300 mg total) by mouth 3 (three) times daily. 11/01/23   Shrivastava, Aryendra, MD  glucose blood (ACCU-CHEK GUIDE TEST) test strip Use as instructed to check blood sugar three times daily 11/01/23   Shrivastava, Aryendra, MD  hydrOXYzine  (ATARAX ) 10 MG tablet Take 1 tablet (10 mg total) by mouth 3 (three) times daily as needed for anxiety.  11/01/23   Shrivastava, Aryendra, MD  insulin  aspart (NOVOLOG ) 100 UNIT/ML injection Inject 0-9 Units into the skin 3 (three) times daily with meals. 11/01/23   Shrivastava, Aryendra, MD  insulin  glargine-yfgn (SEMGLEE ) 100 UNIT/ML injection Inject 0.3 mLs (30 Units total) into the skin at bedtime. 11/01/23   Shrivastava, Aryendra, MD  lidocaine  (LIDODERM ) 5 % Place 1 patch onto the skin daily. Remove & Discard patch within 12 hours or as directed by MD 11/01/23   Shrivastava, Aryendra, MD  metFORMIN  (GLUCOPHAGE ) 500 MG tablet Take 1 tablet (500 mg total) by mouth 2 (two) times daily with a meal. 11/01/23   Hellen Becket, MD  nicotine  (NICODERM CQ  - DOSED IN MG/24 HR) 7 mg/24hr patch Place 1 patch (7 mg total) onto the skin daily as needed (nicotine  withdrawal). 11/01/23   Shrivastava, Aryendra, MD  ondansetron  (ZOFRAN -ODT) 4 MG disintegrating tablet Take 1 tablet (4 mg total) by mouth every 8 (eight) hours as needed for nausea or vomiting. 11/01/23   Shrivastava, Aryendra, MD    Allergies: Lisinopril     Review of Systems  Constitutional: Negative.   HENT: Negative.    Respiratory: Negative.    Cardiovascular: Negative.   Gastrointestinal: Negative.   Endocrine: Positive for polydipsia.  Genitourinary: Negative.   Musculoskeletal: Negative.   Skin: Negative.   Neurological: Negative.   Psychiatric/Behavioral: Negative.    All other systems reviewed and are negative.   Updated Vital Signs BP (!) 143/85 (BP Location:  Right Arm)   Pulse 91   Temp 97.8 F (36.6 C) (Oral)   Resp 16   Ht 5' 8 (1.727 m)   Wt 70 kg   SpO2 98%   BMI 23.46 kg/m   Physical Exam Vitals and nursing note reviewed.  Constitutional:      General: He is not in acute distress.    Appearance: He is well-developed. He is not ill-appearing, toxic-appearing or diaphoretic.  HENT:     Head: Normocephalic and atraumatic.     Comments: No obvious traumatic injury    Nose: Nose normal.     Mouth/Throat:      Mouth: Mucous membranes are moist.  Eyes:     Pupils: Pupils are equal, round, and reactive to light.  Cardiovascular:     Rate and Rhythm: Normal rate and regular rhythm.     Pulses: Normal pulses.     Heart sounds: Normal heart sounds.  Pulmonary:     Effort: Pulmonary effort is normal. No respiratory distress.     Breath sounds: Normal breath sounds and air entry.     Comments: Clear bilaterally, speaks in full sentences without difficulty Abdominal:     General: Bowel sounds are normal. There is no distension.     Palpations: Abdomen is soft.     Tenderness: There is no abdominal tenderness. There is no right CVA tenderness, left CVA tenderness or guarding.     Comments: Soft, nontender  Musculoskeletal:        General: No swelling, tenderness, deformity or signs of injury. Normal range of motion.     Cervical back: Normal range of motion and neck supple.     Right lower leg: No edema.     Left lower leg: No edema.     Comments: Moves extremities without difficulty  Skin:    General: Skin is warm and dry.     Capillary Refill: Capillary refill takes less than 2 seconds.  Neurological:     General: No focal deficit present.     Mental Status: He is alert and oriented to person, place, and time.  Psychiatric:        Attention and Perception: Attention normal.        Mood and Affect: Mood and affect normal.        Speech: Speech normal.        Behavior: Behavior normal. Behavior is cooperative.        Thought Content: Thought content normal.     Comments: Denies SI, HI, AVH.  Cooperative on exam. Goal directed speech.     (all labs ordered are listed, but only abnormal results are displayed) Labs Reviewed  CBC WITH DIFFERENTIAL/PLATELET - Abnormal; Notable for the following components:      Result Value   RBC 4.10 (*)    Hemoglobin 12.8 (*)    All other components within normal limits  BASIC METABOLIC PANEL WITH GFR - Abnormal; Notable for the following components:    Sodium 134 (*)    Glucose, Bld 402 (*)    Creatinine, Ser 1.37 (*)    GFR, Estimated 58 (*)    All other components within normal limits  I-STAT VENOUS BLOOD GAS, ED - Abnormal; Notable for the following components:   pCO2, Ven 42.3 (*)    pO2, Ven 62 (*)    All other components within normal limits  CBG MONITORING, ED - Abnormal; Notable for the following components:   Glucose-Capillary 403 (*)  All other components within normal limits  CBG MONITORING, ED - Abnormal; Notable for the following components:   Glucose-Capillary 161 (*)    All other components within normal limits  BETA-HYDROXYBUTYRIC ACID    EKG: None  Radiology: No results found.   Procedures   Medications Ordered in the ED  sodium chloride  0.9 % bolus 1,000 mL (0 mLs Intravenous Stopped 11/02/23 1543)  insulin  aspart (novoLOG ) injection 8 Units (8 Units Intravenous Given 11/02/23 1551)  35 old here for evaluation hyperglycemia.  Recently discharged from behavioral health yesterday was subsequently seen in the ED for hyperglycemia then.  Discharged home.  His medications were refilled upon discharge however was unable to get filled as he states his pharmacy was closed.  He has some polydipsia, otherwise asymptomatic.  He was requesting mal[place] sandwich, initial evaluation.  Denies SI, HI, AVH. Will plan for IV fluids, insulin , labs and reassess  Labs and imaging personally viewed and interpreted:  CBC without leukocytosis, hemoglobin 12.8--suspect likely hemodilution in setting of IV fluids.  Denies any blood per rectum, melena, declines occult exam Metabolic panel creatinine 1.37-appears at baseline VBG normal pH, normal bicarb Beta hydroxy 0.12 CBG 403  On reassessment patient after getting IV fluids and insulin  is eating a mal[place] sandwich and pinto beans at bedside.  Repeat CBG 161 he is requesting discharge home and an additional [place] sandwich to take with him.  He states he does have the financial means  to pick up his prescriptions which were called into the pharmacy yesterday.  Will have him follow-up outpatient.  Discussed strict return precautions.  The patient has been appropriately medically screened and/or stabilized in the ED. I have low suspicion for any other emergent medical condition which would require further screening, evaluation or treatment in the ED or require inpatient management.  Patient is hemodynamically stable and in no acute distress.  Patient able to ambulate in department prior to ED.  Evaluation does not show acute pathology that would require ongoing or additional emergent interventions while in the emergency department or further inpatient treatment.  I have discussed the diagnosis with the patient and answered all questions.  Pain is been managed while in the emergency department and patient has no further complaints prior to discharge.  Patient is comfortable with plan discussed in room and is stable for discharge at this time.  I have discussed strict return precautions for returning to the emergency department.  Patient was encouraged to follow-up with PCP/specialist refer to at discharge.                                    Medical Decision Making Amount and/or Complexity of Data Reviewed External Data Reviewed: labs, radiology, ECG and notes. Labs: ordered. Decision-making details documented in ED Course.  Risk OTC drugs. Prescription drug management. Decision regarding hospitalization. Diagnosis or treatment significantly limited by social determinants of health.        Final diagnoses:  Hyperglycemia    ED Discharge Orders     None          Clayborne Divis A, PA-C 11/02/23 1713    Tegeler, Lonni PARAS, MD 11/02/23 2321

## 2023-11-02 NOTE — Discharge Instructions (Signed)
 Your medications as prescribed. I have asked the social worker to follow-up regarding your medication refills.

## 2023-11-02 NOTE — Discharge Instructions (Signed)
 Make sure to fill your medications tomorrow  Return for new or worsening symptoms

## 2023-11-03 ENCOUNTER — Telehealth: Payer: Self-pay

## 2023-11-03 ENCOUNTER — Encounter (HOSPITAL_COMMUNITY): Payer: Self-pay

## 2023-11-03 ENCOUNTER — Other Ambulatory Visit: Payer: Self-pay

## 2023-11-03 ENCOUNTER — Emergency Department (HOSPITAL_COMMUNITY)
Admission: EM | Admit: 2023-11-03 | Discharge: 2023-11-03 | Disposition: A | Discharge status: Home / Self Care | Payer: MEDICAID | Attending: Emergency Medicine | Admitting: Emergency Medicine

## 2023-11-03 ENCOUNTER — Telehealth: Payer: Self-pay | Admitting: *Deleted

## 2023-11-03 ENCOUNTER — Other Ambulatory Visit: Payer: Self-pay | Admitting: Pharmacist

## 2023-11-03 DIAGNOSIS — Z794 Long term (current) use of insulin: Secondary | ICD-10-CM | POA: Insufficient documentation

## 2023-11-03 DIAGNOSIS — E1165 Type 2 diabetes mellitus with hyperglycemia: Secondary | ICD-10-CM | POA: Insufficient documentation

## 2023-11-03 DIAGNOSIS — Z760 Encounter for issue of repeat prescription: Secondary | ICD-10-CM | POA: Diagnosis not present

## 2023-11-03 DIAGNOSIS — E1143 Type 2 diabetes mellitus with diabetic autonomic (poly)neuropathy: Secondary | ICD-10-CM | POA: Diagnosis not present

## 2023-11-03 DIAGNOSIS — Z79899 Other long term (current) drug therapy: Secondary | ICD-10-CM | POA: Diagnosis not present

## 2023-11-03 DIAGNOSIS — R739 Hyperglycemia, unspecified: Secondary | ICD-10-CM | POA: Diagnosis present

## 2023-11-03 DIAGNOSIS — I10 Essential (primary) hypertension: Secondary | ICD-10-CM | POA: Diagnosis not present

## 2023-11-03 DIAGNOSIS — Z720 Tobacco use: Secondary | ICD-10-CM | POA: Diagnosis not present

## 2023-11-03 DIAGNOSIS — Z7984 Long term (current) use of oral hypoglycemic drugs: Secondary | ICD-10-CM | POA: Insufficient documentation

## 2023-11-03 DIAGNOSIS — R45851 Suicidal ideations: Secondary | ICD-10-CM | POA: Diagnosis not present

## 2023-11-03 LAB — RAPID URINE DRUG SCREEN, HOSP PERFORMED
Amphetamines: NOT DETECTED
Barbiturates: NOT DETECTED
Benzodiazepines: NOT DETECTED
Cocaine: NOT DETECTED
Opiates: NOT DETECTED
Tetrahydrocannabinol: NOT DETECTED

## 2023-11-03 LAB — URINALYSIS, ROUTINE W REFLEX MICROSCOPIC
Bacteria, UA: NONE SEEN
Bilirubin Urine: NEGATIVE
Glucose, UA: >=500 mg/dL — AB
Hgb urine dipstick: NEGATIVE
Ketones, ur: NEGATIVE mg/dL
Leukocytes,Ua: NEGATIVE
Nitrite: NEGATIVE
Protein, ur: NEGATIVE mg/dL
Specific Gravity, Urine: 1.023 (ref 1.005–1.030)
pH: 7 (ref 5.0–8.0)

## 2023-11-03 LAB — CBC
HCT: 42.2 % (ref 39.0–52.0)
Hemoglobin: 13.1 g/dL (ref 13.0–17.0)
MCH: 30.1 pg (ref 26.0–34.0)
MCHC: 31 g/dL (ref 30.0–36.0)
MCV: 97 fL (ref 80.0–100.0)
Platelets: 390 K/uL (ref 150–400)
RBC: 4.35 MIL/uL (ref 4.22–5.81)
RDW: 12.8 % (ref 11.5–15.5)
WBC: 4.7 K/uL (ref 4.0–10.5)
nRBC: 0 % (ref 0.0–0.2)

## 2023-11-03 LAB — COMPREHENSIVE METABOLIC PANEL WITH GFR
ALT: 44 U/L (ref 0–44)
AST: 27 U/L (ref 15–41)
Albumin: 3.3 g/dL — ABNORMAL LOW (ref 3.5–5.0)
Alkaline Phosphatase: 106 U/L (ref 38–126)
Anion gap: 10 (ref 5–15)
BUN: 26 mg/dL — ABNORMAL HIGH (ref 8–23)
CO2: 24 mmol/L (ref 22–32)
Calcium: 9.3 mg/dL (ref 8.9–10.3)
Chloride: 99 mmol/L (ref 98–111)
Creatinine, Ser: 1.19 mg/dL (ref 0.61–1.24)
GFR, Estimated: >60 mL/min (ref >60)
Glucose, Bld: 551 mg/dL (ref 70–99)
Potassium: 4.9 mmol/L (ref 3.5–5.1)
Sodium: 133 mmol/L — ABNORMAL LOW (ref 135–145)
Total Bilirubin: 0.8 mg/dL (ref 0.0–1.2)
Total Protein: 7.4 g/dL (ref 6.5–8.1)

## 2023-11-03 LAB — ETHANOL: Alcohol, Ethyl (B): <15 mg/dL (ref <15)

## 2023-11-03 LAB — CBG MONITORING, ED
Glucose-Capillary: 476 mg/dL — ABNORMAL HIGH (ref 70–99)
Glucose-Capillary: 198 mg/dL — ABNORMAL HIGH (ref 70–99)

## 2023-11-03 MED ORDER — GABAPENTIN 300 MG PO CAPS
300.0000 mg | ORAL_CAPSULE | Freq: Three times a day (TID) | ORAL | 0 refills | Status: DC
  Filled 2023-11-03: qty 270, 90d supply, fill #0

## 2023-11-03 MED ORDER — DOXEPIN HCL 25 MG PO CAPS
25.0000 mg | ORAL_CAPSULE | Freq: Every day | ORAL | 0 refills | Status: DC
  Filled 2023-11-03: qty 30, 30d supply, fill #0

## 2023-11-03 MED ORDER — SODIUM CHLORIDE 0.9 % IV BOLUS
1000.0000 mL | Freq: Once | INTRAVENOUS | Status: AC
  Administered 2023-11-03: 1000 mL via INTRAVENOUS

## 2023-11-03 MED ORDER — NICOTINE 7 MG/24HR TD PT24: 7.0000 mg | MEDICATED_PATCH | Freq: Every day | TRANSDERMAL | Status: DC | PRN

## 2023-11-03 MED ORDER — LANTUS SOLOSTAR 100 UNIT/ML ~~LOC~~ SOPN
30.0000 [IU] | PEN_INJECTOR | Freq: Every day | SUBCUTANEOUS | 2 refills | Status: DC
  Filled 2023-11-03: qty 15, 50d supply, fill #0

## 2023-11-03 MED ORDER — METFORMIN HCL 500 MG PO TABS: 500.0000 mg | ORAL_TABLET | Freq: Two times a day (BID) | ORAL | Status: DC

## 2023-11-03 MED ORDER — GABAPENTIN 300 MG PO CAPS
300.0000 mg | ORAL_CAPSULE | Freq: Three times a day (TID) | ORAL | Status: DC
  Administered 2023-11-03: 300 mg via ORAL
  Filled 2023-11-03: qty 1

## 2023-11-03 MED ORDER — HYDROXYZINE HCL 10 MG PO TABS: 10.0000 mg | ORAL_TABLET | Freq: Three times a day (TID) | ORAL | Status: DC | PRN

## 2023-11-03 MED ORDER — DULOXETINE HCL 30 MG PO CPEP
60.0000 mg | ORAL_CAPSULE | Freq: Every day | ORAL | Status: DC
  Administered 2023-11-03: 60 mg via ORAL
  Filled 2023-11-03: qty 2

## 2023-11-03 MED ORDER — HYDROXYZINE HCL 10 MG PO TABS
10.0000 mg | ORAL_TABLET | Freq: Three times a day (TID) | ORAL | 0 refills | Status: DC | PRN
  Filled 2023-11-03: qty 90, 30d supply, fill #0

## 2023-11-03 MED ORDER — DULOXETINE HCL 60 MG PO CPEP
60.0000 mg | ORAL_CAPSULE | Freq: Every day | ORAL | 0 refills | Status: DC
  Filled 2023-11-03: qty 30, 30d supply, fill #0

## 2023-11-03 MED ORDER — ATORVASTATIN CALCIUM 40 MG PO TABS
40.0000 mg | ORAL_TABLET | Freq: Every day | ORAL | Status: DC
  Administered 2023-11-03: 40 mg via ORAL
  Filled 2023-11-03: qty 1

## 2023-11-03 MED ORDER — INSULIN ASPART 100 UNIT/ML IJ SOLN
0.0000 [IU] | Freq: Three times a day (TID) | INTRAMUSCULAR | Status: DC
  Filled 2023-11-03: qty 0.09

## 2023-11-03 MED ORDER — INSULIN ASPART 100 UNIT/ML IJ SOLN
0.0000 [IU] | Freq: Three times a day (TID) | INTRAMUSCULAR | 3 refills | Status: DC
  Filled 2023-11-03: qty 10, 28d supply, fill #0

## 2023-11-03 MED ORDER — INSULIN GLARGINE 100 UNIT/ML ~~LOC~~ SOLN
30.0000 [IU] | Freq: Every day | SUBCUTANEOUS | Status: DC
  Administered 2023-11-03: 30 [IU] via SUBCUTANEOUS
  Filled 2023-11-03: qty 0.3

## 2023-11-03 MED ORDER — AMLODIPINE BESYLATE 5 MG PO TABS
10.0000 mg | ORAL_TABLET | Freq: Every day | ORAL | Status: DC
  Administered 2023-11-03: 10 mg via ORAL
  Filled 2023-11-03: qty 2

## 2023-11-03 MED ORDER — INSULIN ASPART 100 UNIT/ML IJ SOLN
15.0000 [IU] | Freq: Once | INTRAMUSCULAR | Status: AC
  Administered 2023-11-03: 15 [IU] via INTRAVENOUS
  Filled 2023-11-03: qty 0.15

## 2023-11-03 MED ORDER — DOXEPIN HCL 25 MG PO CAPS: 25.0000 mg | ORAL_CAPSULE | Freq: Every day | ORAL | Status: DC

## 2023-11-03 MED ORDER — METFORMIN HCL 500 MG PO TABS: 500.0000 mg | ORAL_TABLET | Freq: Two times a day (BID) | ORAL | 0 refills | Status: DC

## 2023-11-03 NOTE — Telephone Encounter (Signed)
 Patient in the office expressing to staff that if he had to sleep on the ground one more time night he was going to kill himself also expressed that he went to the ED yesterday and was given refills on all his medications and his has not been able to take his insulin  because the can not get them filled until the first of the month when he receives his check.  I spoke with the patient. Patient voiced that he really needs somewhere to stay voiced the he was tried. He said the he does not have any family, no where to stay and no reason to live. Voiced that he was going the end it all, because that the reason in living.   Patient had print prescription for all his medications . Call [facility] and they are going to fill his medication and wave the four dollar co-payment.   Patient voiced that he was very hungry patient given some food from the food pantry.  Patient voiced that he can not think and he has been free for any alcohol or drugs for about a month. Voiced that he did not want to go back there voiced that he done with that life. Voiced that all he needed to do was to just take something to stop the thoughts in his head for a while but he does not want to go back to taking that stuff. Voiced that he would just leave this world. Voiced that he had not had he insulin  maybe de will just go in to a coma and he would not have to worry about nothing anymore.Voice he was going out of here by not taking his insulin  , taking all of it or just eating only sweet things.   Patient agree to me calling crisis management. Call  crisis management spoke with Burnard. Burnard asked to speak with the patient . Patient was becoming very agitated during the conversation . Voicing I just saying yes to everything because everything is so messed up in my head. Patient handed the telephone to me and I spoke with Burnard again . Burnard voiced that all of her people were out on calls and she felt that the patient needs  assistance early than when she can send someone so she is calling [facility] and [facility] to come out to assess.  [facility], [facility] sw( Jamal) and paramedic on the office patient assess and patient was  taken to [facility] hospital.   Call received for Jamal and Voiced that the [facility] have been attempting the contact the patient and help with treatment and housing. Advised Jamal patient does not have a working telephone. Jamal voiced that he is aware, but wanted  us  to be aware.  Patient still at [facility].

## 2023-11-03 NOTE — Discharge Instructions (Signed)
 Return to the emergency department as needed for your health care.

## 2023-11-03 NOTE — ED Notes (Addendum)
 Pt gave this tech his insulin  so it can be in the fridge. Pt's insulin  is in the triage fridge

## 2023-11-03 NOTE — Telephone Encounter (Signed)
 Call to [facility]( [phone]) and Delon Gill [phone])  at [facility] . I was unable to reach either VM  left.

## 2023-11-03 NOTE — ED Notes (Addendum)
 Writer was trying to explain to pt that he is up for discharge. In the process pt became very agitated and charged Clinical research associate. Pt put up her hand to stop him from coming towards her. Pt was told to stop and back up, which he didn't listen. Security called and pt returned to his bed, packed up his belongings and left with security.  Vitals, CBG unable to be obtained.

## 2023-11-03 NOTE — ED Provider Notes (Signed)
 [place] EMERGENCY DEPARTMENT AT [facility] Provider Note   CSN: [id number] Arrival date & time: 11/03/23  1243     Patient presents with: Suicidal   David Wyatt is a 63 y.o. male.   63 yo M with a chief complaint of suicidal ideation, going on since yesterday.  No plan.  Has had trouble with his mental health in the past.  Has been struggling with his blood sugar over the weekend.  He got his medications refilled today.  Denies abdominal pain. He is quite hungry.        Prior to Admission medications   Medication Sig Start Date End Date Taking? Authorizing Provider  Accu-Chek Softclix Lancets lancets Use to check blood sugar three times daily as directed 11/01/23   Shrivastava, Aryendra, MD  amLODipine  (NORVASC ) 10 MG tablet Take 1 tablet (10 mg total) by mouth daily. Patient not taking: Reported on 11/03/2023 11/01/23   Shrivastava, Aryendra, MD  atorvastatin  (LIPITOR) 40 MG tablet Take 1 tablet (40 mg total) by mouth daily. Patient not taking: Reported on 11/03/2023 11/01/23   Shrivastava, Aryendra, MD  Blood Glucose Monitoring Suppl (ACCU-CHEK GUIDE ME) w/Device KIT Use to test blood sugar 3 times daily. 11/01/23   Shrivastava, Aryendra, MD  doxepin  (SINEQUAN ) 25 MG capsule Take 1 capsule (25 mg total) by mouth at bedtime. Patient not taking: Reported on 11/03/2023 11/01/23   Shrivastava, Aryendra, MD  DULoxetine  (CYMBALTA ) 60 MG capsule Take 1 capsule (60 mg total) by mouth daily. Patient not taking: Reported on 11/03/2023 11/01/23   Shrivastava, Aryendra, MD  gabapentin  (NEURONTIN ) 300 MG capsule Take 1 capsule (300 mg total) by mouth 3 (three) times daily. Patient not taking: Reported on 11/03/2023 11/01/23   Shrivastava, Aryendra, MD  glucose blood (ACCU-CHEK GUIDE TEST) test strip Use as instructed to check blood sugar three times daily 11/01/23   Shrivastava, Aryendra, MD  hydrOXYzine  (ATARAX ) 10 MG tablet Take 1 tablet (10 mg total) by mouth 3 (three) times daily as  needed FOR ANXIETY. Patient not taking: Reported on 11/03/2023 11/01/23   Shrivastava, Aryendra, MD  insulin  aspart (NOVOLOG ) 100 UNIT/ML injection Inject 0-9 Units into the skin 3 (three) times daily with meals. Patient not taking: Reported on 11/03/2023 11/01/23   Shrivastava, Aryendra, MD  insulin  glargine (LANTUS  SOLOSTAR) 100 UNIT/ML Solostar Pen Inject 30 Units into the skin daily. Patient not taking: Reported on 11/03/2023 11/03/23   Newlin, Enobong, MD  lidocaine  (LIDODERM ) 5 % Place 1 patch onto the skin daily. Remove & Discard patch within 12 hours or as directed by MD Patient not taking: Reported on 11/03/2023 11/01/23   Shrivastava, Aryendra, MD  metFORMIN  (GLUCOPHAGE ) 500 MG tablet Take 1 tablet (500 mg total) by mouth 2 (two) times daily with a meal. Patient not taking: Reported on 11/03/2023 11/01/23   Shrivastava, Aryendra, MD  nicotine  (NICODERM CQ  - DOSED IN MG/24 HR) 7 mg/24hr patch Place 1 patch (7 mg total) onto the skin daily as needed (nicotine  withdrawal). Patient not taking: Reported on 11/03/2023 11/01/23   Shrivastava, Aryendra, MD  ondansetron  (ZOFRAN -ODT) 4 MG disintegrating tablet Take 1 tablet (4 mg total) by mouth every 8 (eight) hours as needed for nausea or vomiting. Patient not taking: Reported on 11/03/2023 11/01/23   Shrivastava, Aryendra, MD    Allergies: Lisinopril     Review of Systems  Updated Vital Signs BP (!) 153/91   Pulse 88   Temp 98.9 F (37.2 C) (Oral)   Resp 16   Ht 5'  8 (1.727 m)   Wt 70 kg   SpO2 97%   BMI 23.46 kg/m   Physical Exam Vitals and nursing note reviewed.  Constitutional:      Appearance: He is well-developed.  HENT:     Head: Normocephalic and atraumatic.  Eyes:     Pupils: Pupils are equal, round, and reactive to light.  Neck:     Vascular: No JVD.  Cardiovascular:     Rate and Rhythm: Normal rate and regular rhythm.     Heart sounds: No murmur heard.    No friction rub. No gallop.  Pulmonary:     Effort: No respiratory  distress.     Breath sounds: No wheezing.  Abdominal:     General: There is no distension.     Tenderness: There is no abdominal tenderness. There is no guarding or rebound.  Musculoskeletal:        General: Normal range of motion.     Cervical back: Normal range of motion and neck supple.  Skin:    Coloration: Skin is not pale.     Findings: No rash.  Neurological:     Mental Status: He is alert and oriented to person, place, and time.  Psychiatric:        Behavior: Behavior normal.     (all labs ordered are listed, but only abnormal results are displayed) Labs Reviewed  COMPREHENSIVE METABOLIC PANEL WITH GFR - Abnormal; Notable for the following components:      Result Value   Sodium 133 (*)    Glucose, Bld 551 (*)    BUN 26 (*)    Albumin 3.3 (*)    All other components within normal limits  URINALYSIS, ROUTINE W REFLEX MICROSCOPIC - Abnormal; Notable for the following components:   Color, Urine STRAW (*)    Glucose, UA >=500 (*)    All other components within normal limits  CBG MONITORING, ED - Abnormal; Notable for the following components:   Glucose-Capillary 476 (*)    All other components within normal limits  ETHANOL  CBC  RAPID URINE DRUG SCREEN, HOSP PERFORMED  CBG MONITORING, ED    EKG: None  Radiology: No results found.   Procedures   Medications Ordered in the ED  amLODipine  (NORVASC ) tablet 10 mg (has no administration in time range)  atorvastatin  (LIPITOR) tablet 40 mg (has no administration in time range)  doxepin  (SINEQUAN ) capsule 25 mg (has no administration in time range)  DULoxetine  (CYMBALTA ) DR capsule 60 mg (has no administration in time range)  gabapentin  (NEURONTIN ) capsule 300 mg (has no administration in time range)  hydrOXYzine  (ATARAX ) tablet 10 mg (has no administration in time range)  insulin  glargine (LANTUS ) injection 30 Units (has no administration in time range)  metFORMIN  (GLUCOPHAGE ) tablet 500 mg (has no administration  in time range)  nicotine  (NICODERM CQ  - dosed in mg/24 hr) patch 7 mg (has no administration in time range)  insulin  aspart (novoLOG ) injection 0-9 Units (has no administration in time range)  sodium chloride  0.9 % bolus 1,000 mL (1,000 mLs Intravenous New Bag/Given 11/03/23 1346)  insulin  aspart (novoLOG ) injection 15 Units (15 Units Intravenous Given 11/03/23 1346)                                    Medical Decision Making Amount and/or Complexity of Data Reviewed Labs: ordered.  Risk OTC drugs. Prescription drug management.  63 yo M with a chief complaint of hyperglycemia.  Patient has been seen every day over the weekend.  Has not had his medications.  He tells me he got them refilled just prior to coming here.  He is here because he is suicidal.  He does not have a plan.  Will consult TTS.   Patient is not in DKA.  Will start his home meds.  TTS evaluation.  Sliding scale insulin .  The patients results and plan were reviewed and discussed.   Any x-rays performed were independently reviewed by myself.   Differential diagnosis were considered with the presenting HPI.  Medications  amLODipine  (NORVASC ) tablet 10 mg (has no administration in time range)  atorvastatin  (LIPITOR) tablet 40 mg (has no administration in time range)  doxepin  (SINEQUAN ) capsule 25 mg (has no administration in time range)  DULoxetine  (CYMBALTA ) DR capsule 60 mg (has no administration in time range)  gabapentin  (NEURONTIN ) capsule 300 mg (has no administration in time range)  hydrOXYzine  (ATARAX ) tablet 10 mg (has no administration in time range)  insulin  glargine (LANTUS ) injection 30 Units (has no administration in time range)  metFORMIN  (GLUCOPHAGE ) tablet 500 mg (has no administration in time range)  nicotine  (NICODERM CQ  - dosed in mg/24 hr) patch 7 mg (has no administration in time range)  insulin  aspart (novoLOG ) injection 0-9 Units (has no administration in time range)  sodium chloride  0.9 %  bolus 1,000 mL (1,000 mLs Intravenous New Bag/Given 11/03/23 1346)  insulin  aspart (novoLOG ) injection 15 Units (15 Units Intravenous Given 11/03/23 1346)    Vitals:   11/03/23 1246 11/03/23 1247  BP:  (!) 153/91  Pulse:  88  Resp:  16  Temp:  98.9 F (37.2 C)  TempSrc:  Oral  SpO2:  97%  Weight: 70 kg   Height: 5' 8 (1.727 m)     Final diagnoses:  Suicidal ideation  Hyperglycemia        Final diagnoses:  Suicidal ideation  Hyperglycemia    ED Discharge Orders     None          [name], DO 11/03/23 1456

## 2023-11-03 NOTE — Discharge Instructions (Addendum)
 Thank for letting us  evaluate you today.  I have sent your metformin  to the pharmacy here at [facility].  Please pick this up today and start taking it tonight.  Return to emergency department you experience altered mentation, confusion, glucose greater than 500 that does not improve with insulin  use, worsening symptoms

## 2023-11-03 NOTE — ED Provider Notes (Signed)
 David Wyatt EMERGENCY DEPARTMENT AT [facility] Provider Note   CSN: [id number] Arrival date & time: 11/03/23  8365     Patient presents with: Wants to Apologize   David Wyatt is a 63 y.o. male with past medical history of polysubstance abuse, HTN, diabetic neuropathy, HLD, insulin -dependent T2DM, kidney stones presents to emergency department for evaluation of sugar check and to apologize to a paramedic that he yelled at prior to being discharged earlier this morning.  Was evaluated this morning for hyperglycemia, medication refill, SI, hunger.  Lab work from this morning noted mild hyperglycemia 476 with no signs of DKA.  He told previous EDP that he had SI as his medications were not refilled but he did not mean it.  His medications were refilled but he was escorted from building as he was verbally threatening staff.  He returns so that he can apologize to the staff member that he yelled at as this is not a Godly way to act.   HPI     Prior to Admission medications   Medication Sig Start Date End Date Taking? Authorizing Provider  Accu-Chek Softclix Lancets lancets Use to check blood sugar three times daily as directed 11/01/23   Shrivastava, Aryendra, MD  amLODipine  (NORVASC ) 10 MG tablet Take 1 tablet (10 mg total) by mouth daily. Patient not taking: Reported on 11/03/2023 11/01/23   Shrivastava, Aryendra, MD  atorvastatin  (LIPITOR) 40 MG tablet Take 1 tablet (40 mg total) by mouth daily. Patient not taking: Reported on 11/03/2023 11/01/23   Shrivastava, Aryendra, MD  Blood Glucose Monitoring Suppl (ACCU-CHEK GUIDE ME) w/Device KIT Use to test blood sugar 3 times daily. 11/01/23   Shrivastava, Aryendra, MD  doxepin  (SINEQUAN ) 25 MG capsule Take 1 capsule (25 mg total) by mouth at bedtime. Patient not taking: Reported on 11/03/2023 11/01/23   Shrivastava, Aryendra, MD  DULoxetine  (CYMBALTA ) 60 MG capsule Take 1 capsule (60 mg total) by mouth daily. Patient not taking: Reported  on 11/03/2023 11/01/23   Shrivastava, Aryendra, MD  gabapentin  (NEURONTIN ) 300 MG capsule Take 1 capsule (300 mg total) by mouth 3 (three) times daily. Patient not taking: Reported on 11/03/2023 11/01/23   Shrivastava, Aryendra, MD  glucose blood (ACCU-CHEK GUIDE TEST) test strip Use as instructed to check blood sugar three times daily 11/01/23   Shrivastava, Aryendra, MD  hydrOXYzine  (ATARAX ) 10 MG tablet Take 1 tablet (10 mg total) by mouth 3 (three) times daily as needed FOR ANXIETY. Patient not taking: Reported on 11/03/2023 11/01/23   Shrivastava, Aryendra, MD  insulin  aspart (NOVOLOG ) 100 UNIT/ML injection Inject 0-9 Units into the skin 3 (three) times daily with meals. Patient not taking: Reported on 11/03/2023 11/01/23   Shrivastava, Aryendra, MD  insulin  glargine (LANTUS  SOLOSTAR) 100 UNIT/ML Solostar Pen Inject 30 Units into the skin daily. Patient not taking: Reported on 11/03/2023 11/03/23   Newlin, Enobong, MD  lidocaine  (LIDODERM ) 5 % Place 1 patch onto the skin daily. Remove & Discard patch within 12 hours or as directed by MD Patient not taking: Reported on 11/03/2023 11/01/23   Shrivastava, Aryendra, MD  metFORMIN  (GLUCOPHAGE ) 500 MG tablet Take 1 tablet (500 mg total) by mouth 2 (two) times daily with a meal. 11/03/23   [name], PA  nicotine  (NICODERM CQ  - DOSED IN MG/24 HR) 7 mg/24hr patch Place 1 patch (7 mg total) onto the skin daily as needed (nicotine  withdrawal). Patient not taking: Reported on 11/03/2023 11/01/23   Shrivastava, Aryendra, MD  ondansetron  (ZOFRAN -ODT) 4  MG disintegrating tablet Take 1 tablet (4 mg total) by mouth every 8 (eight) hours as needed for nausea or vomiting. Patient not taking: Reported on 11/03/2023 11/01/23   Shrivastava, Aryendra, MD    Allergies: Lisinopril     Review of Systems  Psychiatric/Behavioral:  Negative for behavioral problems and suicidal ideas.     Updated Vital Signs BP (!) 163/89 (BP Location: Left Arm)   Pulse (!) 107   Temp 98.4  F (36.9 C) (Oral)   Resp 18   Ht 5' 8 (1.727 m)   Wt 70 kg   SpO2 99%   BMI 23.46 kg/m   Physical Exam Vitals and nursing note reviewed.  Constitutional:      General: He is not in acute distress.    Appearance: Normal appearance.  HENT:     Head: Normocephalic and atraumatic.  Eyes:     Conjunctiva/sclera: Conjunctivae normal.  Cardiovascular:     Rate and Rhythm: Normal rate.  Pulmonary:     Effort: Pulmonary effort is normal. No respiratory distress.     Breath sounds: Normal breath sounds.  Skin:    Coloration: Skin is not jaundiced or pale.  Neurological:     Mental Status: He is alert and oriented to person, place, and time. Mental status is at baseline.     GCS: GCS eye subscore is 4. GCS verbal subscore is 5. GCS motor subscore is 6.  Psychiatric:        Attention and Perception: He does not perceive auditory or visual hallucinations.        Speech: Speech normal.        Thought Content: Thought content does not include homicidal or suicidal ideation. Thought content does not include homicidal or suicidal plan.     Comments: Does not appear to be responding to internal stimuli     (all labs ordered are listed, but only abnormal results are displayed) Labs Reviewed  CBG MONITORING, ED    EKG: None  Radiology: No results found.   Medications Ordered in the ED - No data to display                                  Medical Decision Making Risk Prescription drug management.   Patient presents to the ED for concern of hyperglycemia, medication refill, this involves an extensive number of treatment options, and is a complaint that carries with it a high risk of complications and morbidity.  The differential diagnosis includes DKA, HHS   Co morbidities that complicate the patient evaluation  T2DM-insulin -dependent   Additional history obtained:  Additional history obtained from Nursing and Outside Medical Records   External records from outside  source obtained and reviewed including triage note, ED note from this morning   Lab Tests:  I Ordered, and personally interpreted labs.  The pertinent results include:   CBG 198    Problem List / ED Course:  Hyperglycemia Requesting to have his CBG checked.  It was 198 following being provided 15 units of insulin  earlier this morning.  As there was no signs of DKA there from lab work, do not feel that there is signs of DKA with a CBG of 198 No complaints of AMS, NVD Encounter for medication refill Reports that he did not receive his metformin .  I did send it to [facility] as this is closer and he would be able to pick this  up today I noted that he had all of his other medications in his back with him that were all on date   Reevaluation:  After the interventions noted above, I reevaluated the patient and found that they have :stayed the same   Social Determinants of Health:  Current tobacco use, polysubstance abuse   Dispostion:  After consideration of the diagnostic results and the patients response to treatment, I feel that the patent would benefit from outpatient management with medication compliance.   Discussed ED workup, disposition, return to ED precautions with patient who expresses understanding agrees with plan.  All questions answered to their satisfaction.  They are agreeable to plan.  Discharge instructions provided on paperwork  Final diagnoses:  None    ED Discharge Orders          Ordered    metFORMIN  (GLUCOPHAGE ) 500 MG tablet  2 times daily with meals        11/03/23 1816             [name], PA 11/03/23 2351    Mannie Pac T, DO 11/06/23 [phone]

## 2023-11-03 NOTE — ED Triage Notes (Addendum)
 Patient David Wyatt voluntarily. Stated he is suicidal today and has no place to live. Patient said he takes his insulin  but his sugar was 562 with ambulance. Seen yesterday for the same.

## 2023-11-03 NOTE — Transitions of Care (Post Inpatient/ED Visit) (Signed)
   11/03/2023  Name: David Wyatt MRN: [id number] DOB: 1960/11/24  Today's TOC FU Call Status: Today's TOC FU Call Status:: Unsuccessful Call (1st Attempt) Unsuccessful Call (1st Attempt) Date: 11/03/23  Attempted to reach the patient regarding the most recent Inpatient/ED visit.  A detailed HIPAA compliant message was left, including RNCM contact information, requesting a return call.   Follow Up Plan: Additional outreach attempts will be made to reach the patient to complete the Transitions of Care (Post Inpatient/ED visit) call.   Andrea Dimes RN, BSN [place]  [facility] RN Care Manager [phone]

## 2023-11-03 NOTE — ED Triage Notes (Addendum)
 Patient said he has been to 3 different churches over the last 2 days and he was upset about his behavior that he portrayed earlier to the paramedic taking care of him. He said that was not a Godly way to act. He said the devil made him upset and he truly wants to apologize for being out of character to anyone who heard him yelling.

## 2023-11-03 NOTE — Telephone Encounter (Signed)
 error

## 2023-11-03 NOTE — ED Provider Notes (Signed)
  Physical Exam  BP (!) 153/91   Pulse 88   Temp 98.9 F (37.2 C) (Oral)   Resp 16   Ht 5' 8 (1.727 m)   Wt 70 kg   SpO2 97%   BMI 23.46 kg/m   Physical Exam  Procedures  Procedures  ED Course / MDM    Medical Decision Making Amount and/or Complexity of Data Reviewed Labs: ordered.  Risk OTC drugs. Prescription drug management.   I, Larnell Gravely, have assumed care for this patient.  In brief this is a 63 year old male who presents to the emergency department for hyperglycemia, reported some suicidal ideation.  To evaluate the patient.  Told me that he was not feeling suicidal, had only said that so that he would be able to be seen and get his insulin .  Patient has received his insulin .  Is alert, appropriate.  Does not appear to be responding to internal stimuli.  He is supposed to follow-up with his primary care doctor.  Patient told me return to the emergency department if he had thoughts of self-harm.       Gravely Pac T, DO 11/03/23 1520

## 2023-11-04 LAB — GLUCOSE, CAPILLARY
Glucose-Capillary: 389 mg/dL — ABNORMAL HIGH (ref 70–99)
Glucose-Capillary: 186 mg/dL — ABNORMAL HIGH (ref 70–99)

## 2023-11-05 ENCOUNTER — Ambulatory Visit: Payer: MEDICAID | Admitting: Vascular Surgery

## 2023-11-05 ENCOUNTER — Telehealth: Payer: Self-pay | Admitting: *Deleted

## 2023-11-05 NOTE — Transitions of Care (Post Inpatient/ED Visit) (Signed)
   11/05/2023  Name: David Wyatt MRN: [id number] DOB: 10-Jan-1961  Today's TOC FU Call Status: Today's TOC FU Call Status:: Unsuccessful Call (2nd Attempt) Unsuccessful Call (2nd Attempt) Date: 11/05/23  Attempted to reach the patient regarding the most recent Inpatient/ED visit.  Follow Up Plan: Additional outreach attempts will be made to reach the patient to complete the Transitions of Care (Post Inpatient/ED visit) call.   Andrea Dimes RN, BSN [name]  [facility] RN Care Manager [phone]

## 2023-11-06 ENCOUNTER — Telehealth: Payer: Self-pay

## 2023-11-06 NOTE — Discharge Summary (Signed)
 Physician Discharge Summary   Patient: David Wyatt MRN: [id number] DOB: March 21, 1960  Admit date:     10/18/2023  Discharge date: 10/18/2023  Discharge Physician: Terry LOISE Hurst   PCP: Delbert Clam, MD   Recommendations at discharge: The patient left against medical advice.  Discharge Diagnoses: Active Problems:   Hyperglycemia  Hospital Course: [name] is a 63 y.o. male with medical history significant for uncontrolled type 2 diabetes, homelessness, frequent ER visits for uncontrolled diabetes with hyperglycemia.  Admitted on 10/18/23 for uncontrolled type 2 diabetes with hyperglycemia.  The patient left AMA without completing his treatment because of having finger sticks done for blood glucose checks.    In the ER, serum glucose 971 with non anion gap metabolic acidosis.  Dehydrated with prerenal AKI.  He is alert and oriented x 3.  The patient was started on IV fluid and IV insulin .  Right in the middle of treatment, he decided to leave against medical advice.  Assessment and Plan: Uncontrolled Type 2 diabetes with severe hyperglycemia without HHS History of medication noncompliance Last hemoglobin A1c 13.9 on 09/27/2023 Continue IV fluid and IV insulin  Replace electrolytes as indicated Diabetes coordinator consulted to assist the patient with diabetes and insulin  education   AKI, prerenal in the setting of dehydration from hyperglycemia Baseline creatinine 1.1 with GFR greater than 60 Presented with creatinine of 1.83 with GFR 41. Continue IV fluid hydration Monitor urine output Repeat BMP in the morning   Pseudohyponatremia Corrected sodium for hyperglycemia 142 Continue IV fluids and continue to monitor electrolytes Okay for water and ice chips   Burning pain in the feet, symmetrically, suspect diabetic polyneuropathy Gabapentin  100 mg nightly Fall precautions   Homelessness TOC consulted to assist with discharge planning   Medication noncompliance History of  frequently leaving AGAINST MEDICAL ADVICE    Consultants: Diabetes Coordinator, TOC case Production designer, theatre/television/film. Procedures performed: None  Disposition: Left AMA Diet recommendation: Left AMA  DISCHARGE MEDICATION: Allergies as of 10/19/2023       Reactions   Lisinopril  Other (See Comments), Swelling   Lip Swelling, angioedema angioedema  Lip Swelling  Lip Swelling, angioedema        Medication List    You have not been prescribed any medications.     Discharge Exam: Filed Weights   10/19/23 0008  Weight: 67.7 kg   Well developed well nourished in no acute distress Alert and oriented x 3  Condition at discharge: Left against medical advice.  The results of significant diagnostics from this hospitalization (including imaging, microbiology, ancillary and laboratory) are listed below for reference.   Imaging Studies: DG Foot Complete Right Result Date: 10/18/2023 CLINICAL DATA:  Right foot pain EXAM: RIGHT FOOT COMPLETE - 3+ VIEW COMPARISON:  None available FINDINGS: Normal alignment. No acute fracture or dislocation. 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 Remaining joint spaces are preserved. Soft tissues are unremarkable. IMPRESSION: 1. 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 Electronically Signed   By: Dorethia Molt M.D.   On: 10/18/2023 21:38    Microbiology: Results for orders placed or performed during the hospital encounter of 10/18/23  MRSA Next Gen by PCR, Nasal     Status: None   Collection Time: 10/19/23 12:41 AM   Specimen: Nasal Mucosa; Nasal Swab  Result Value Ref Range Status   MRSA by PCR Next Gen NOT DETECTED NOT DETECTED Final    Comment: (NOTE) The GeneXpert MRSA Assay (FDA approved for NASAL specimens only), is one component of  a comprehensive MRSA colonization surveillance program. It is not intended to diagnose MRSA infection nor to guide or monitor treatment for MRSA infections. Test performance is not FDA approved in  patients less than 59 years old. Performed at [facility], [address], [place], [place] [address]     Labs: CBC: Recent Labs  Lab 11/01/23 2012 11/01/23 2026 11/02/23 1421 11/02/23 1450 11/03/23 1332  WBC 5.4  --  5.2  --  4.7  NEUTROABS  --   --  2.6  --   --   HGB 13.7 14.3 12.8* 13.3 13.1  HCT 42.8 42.0 40.0 39.0 42.2  MCV 97.7  --  97.6  --  97.0  PLT 403*  --  372  --  390   Basic Metabolic Panel: Recent Labs  Lab 11/01/23 2012 11/01/23 2026 11/02/23 0200 11/02/23 1421 11/02/23 1450 11/03/23 1332  NA 135 136 137 134* 135 133*  K 5.2* 5.1 4.3 4.6 4.8 4.9  CL 98 101 101 101  --  99  CO2 24  --  23 24  --  24  GLUCOSE 461* 450* 111* 402*  --  551*  BUN 26* 28* 21 20  --  26*  CREATININE 1.47* 1.30* 1.20 1.37*  --  1.19  CALCIUM  9.4  --  9.1 9.2  --  9.3   Liver Function Tests: Recent Labs  Lab 11/03/23 1332  AST 27  ALT 44  ALKPHOS 106  BILITOT 0.8  PROT 7.4  ALBUMIN 3.3*   CBG: Recent Labs  Lab 11/02/23 0050 11/02/23 1540 11/02/23 1658 11/03/23 1251 11/03/23 1819  GLUCAP 185* 403* 161* 476* 198*    Discharge time spent: less than 30 minutes.  Signed: Terry LOISE Hurst, DO [facility] 11/06/2023

## 2023-11-06 NOTE — Transitions of Care (Post Inpatient/ED Visit) (Signed)
   11/06/2023  Name: David Wyatt MRN: [id number] DOB: 1960-08-05  Today's TOC FU Call Status: Today's TOC FU Call Status:: Unsuccessful Call (3rd Attempt) Unsuccessful Call (3rd Attempt) Date: 11/06/23  Attempted to reach the patient regarding the most recent Inpatient/ED visit.  Follow Up Plan: No further outreach attempts will be made at this time. We have been unable to contact the patient.  Alan Ee, RN, BSN, CEN [employer]- Transition of Care Team.  [facility] [phone]

## 2023-11-10 ENCOUNTER — Encounter (HOSPITAL_COMMUNITY): Payer: Self-pay

## 2023-11-10 ENCOUNTER — Other Ambulatory Visit: Payer: Self-pay

## 2023-11-10 ENCOUNTER — Emergency Department (HOSPITAL_COMMUNITY)
Admission: EM | Admit: 2023-11-10 | Discharge: 2023-11-10 | Disposition: A | Discharge status: Home / Self Care | Payer: MEDICAID | Attending: Emergency Medicine | Admitting: Emergency Medicine

## 2023-11-10 DIAGNOSIS — Z7984 Long term (current) use of oral hypoglycemic drugs: Secondary | ICD-10-CM | POA: Diagnosis not present

## 2023-11-10 DIAGNOSIS — Z5948 Other specified lack of adequate food: Secondary | ICD-10-CM | POA: Insufficient documentation

## 2023-11-10 DIAGNOSIS — T730XXA Starvation, initial encounter: Secondary | ICD-10-CM

## 2023-11-10 DIAGNOSIS — Z5900 Homelessness unspecified: Secondary | ICD-10-CM | POA: Insufficient documentation

## 2023-11-10 DIAGNOSIS — Z794 Long term (current) use of insulin: Secondary | ICD-10-CM | POA: Insufficient documentation

## 2023-11-10 DIAGNOSIS — M791 Myalgia, unspecified site: Secondary | ICD-10-CM | POA: Insufficient documentation

## 2023-11-10 DIAGNOSIS — Z79899 Other long term (current) drug therapy: Secondary | ICD-10-CM | POA: Insufficient documentation

## 2023-11-10 DIAGNOSIS — E119 Type 2 diabetes mellitus without complications: Secondary | ICD-10-CM | POA: Diagnosis not present

## 2023-11-10 LAB — COMPREHENSIVE METABOLIC PANEL WITH GFR
ALT: 25 U/L (ref 0–44)
AST: 26 U/L (ref 15–41)
Albumin: 3.4 g/dL — ABNORMAL LOW (ref 3.5–5.0)
Alkaline Phosphatase: 87 U/L (ref 38–126)
Anion gap: 10 (ref 5–15)
BUN: 16 mg/dL (ref 8–23)
CO2: 25 mmol/L (ref 22–32)
Calcium: 8.8 mg/dL — ABNORMAL LOW (ref 8.9–10.3)
Chloride: 106 mmol/L (ref 98–111)
Creatinine, Ser: 1.14 mg/dL (ref 0.61–1.24)
GFR, Estimated: >60 mL/min (ref >60)
Glucose, Bld: 201 mg/dL — ABNORMAL HIGH (ref 70–99)
Potassium: 3.9 mmol/L (ref 3.5–5.1)
Sodium: 141 mmol/L (ref 135–145)
Total Bilirubin: 1.3 mg/dL — ABNORMAL HIGH (ref 0.0–1.2)
Total Protein: 7 g/dL (ref 6.5–8.1)

## 2023-11-10 LAB — CBC WITH DIFFERENTIAL/PLATELET
Abs Immature Granulocytes: 0.01 K/uL (ref 0.00–0.07)
Basophils Absolute: 0.1 K/uL (ref 0.0–0.1)
Basophils Relative: 1 %
Eosinophils Absolute: 0.3 K/uL (ref 0.0–0.5)
Eosinophils Relative: 5 %
HCT: 40.6 % (ref 39.0–52.0)
Hemoglobin: 13.1 g/dL (ref 13.0–17.0)
Immature Granulocytes: 0 %
Lymphocytes Relative: 41 %
Lymphs Abs: 2.2 K/uL (ref 0.7–4.0)
MCH: 30.8 pg (ref 26.0–34.0)
MCHC: 32.3 g/dL (ref 30.0–36.0)
MCV: 95.5 fL (ref 80.0–100.0)
Monocytes Absolute: 0.7 K/uL (ref 0.1–1.0)
Monocytes Relative: 13 %
Neutro Abs: 2.1 K/uL (ref 1.7–7.7)
Neutrophils Relative %: 40 %
Platelets: 358 K/uL (ref 150–400)
RBC: 4.25 MIL/uL (ref 4.22–5.81)
RDW: 13.1 % (ref 11.5–15.5)
WBC: 5.2 K/uL (ref 4.0–10.5)
nRBC: 0 % (ref 0.0–0.2)

## 2023-11-10 LAB — CK: Total CK: 271 U/L (ref 49–397)

## 2023-11-10 NOTE — ED Triage Notes (Signed)
 Pt to ED via [facility] from outside, pt is homeless and says he is sleeping outside at night. Pt c/o aching all over. Pt denies any other complaints.

## 2023-11-10 NOTE — ED Notes (Signed)
 Patient given food by PA Bernis

## 2023-11-10 NOTE — ED Provider Notes (Addendum)
 [place] EMERGENCY DEPARTMENT AT [facility] Provider Note   CSN: [id number] Arrival date & time: 11/10/23  9270     Patient presents with: Generalized Body Aches   David Wyatt is a 63 y.o. male with h/o homelessness, uncontrolled DM, cocaine use presents to the ER today for evaluation of diffuse body aches after sleeping on the ground at night. He has picked up his diabetes medications. He is requesting coffee and sandwich. Last drug use was 2 days prior. Denies any FLS, chest pain, or abdominal pain.    HPI     Prior to Admission medications   Medication Sig Start Date End Date Taking? Authorizing Provider  Accu-Chek Softclix Lancets lancets Use to check blood sugar three times daily as directed 11/01/23   Shrivastava, Aryendra, MD  amLODipine  (NORVASC ) 10 MG tablet Take 1 tablet (10 mg total) by mouth daily. Patient not taking: Reported on 11/03/2023 11/01/23   Shrivastava, Aryendra, MD  atorvastatin  (LIPITOR) 40 MG tablet Take 1 tablet (40 mg total) by mouth daily. Patient not taking: Reported on 11/03/2023 11/01/23   Shrivastava, Aryendra, MD  Blood Glucose Monitoring Suppl (ACCU-CHEK GUIDE ME) w/Device KIT Use to test blood sugar 3 times daily. 11/01/23   Shrivastava, Aryendra, MD  doxepin  (SINEQUAN ) 25 MG capsule Take 1 capsule (25 mg total) by mouth at bedtime. Patient not taking: Reported on 11/03/2023 11/01/23   Shrivastava, Aryendra, MD  DULoxetine  (CYMBALTA ) 60 MG capsule Take 1 capsule (60 mg total) by mouth daily. Patient not taking: Reported on 11/03/2023 11/01/23   Shrivastava, Aryendra, MD  gabapentin  (NEURONTIN ) 300 MG capsule Take 1 capsule (300 mg total) by mouth 3 (three) times daily. Patient not taking: Reported on 11/03/2023 11/01/23   Shrivastava, Aryendra, MD  glucose blood (ACCU-CHEK GUIDE TEST) test strip Use as instructed to check blood sugar three times daily 11/01/23   Shrivastava, Aryendra, MD  hydrOXYzine  (ATARAX ) 10 MG tablet Take 1 tablet (10 mg  total) by mouth 3 (three) times daily as needed FOR ANXIETY. Patient not taking: Reported on 11/03/2023 11/01/23   Shrivastava, Aryendra, MD  insulin  aspart (NOVOLOG ) 100 UNIT/ML injection Inject 0-9 Units into the skin 3 (three) times daily with meals. Patient not taking: Reported on 11/03/2023 11/01/23   Shrivastava, Aryendra, MD  insulin  glargine (LANTUS  SOLOSTAR) 100 UNIT/ML Solostar Pen Inject 30 Units into the skin daily. Patient not taking: Reported on 11/03/2023 11/03/23   Newlin, Enobong, MD  lidocaine  (LIDODERM ) 5 % Place 1 patch onto the skin daily. Remove & Discard patch within 12 hours or as directed by MD Patient not taking: Reported on 11/03/2023 11/01/23   Shrivastava, Aryendra, MD  metFORMIN  (GLUCOPHAGE ) 500 MG tablet Take 1 tablet (500 mg total) by mouth 2 (two) times daily with a meal. 11/03/23   [name], PA  nicotine  (NICODERM CQ  - DOSED IN MG/24 HR) 7 mg/24hr patch Place 1 patch (7 mg total) onto the skin daily as needed (nicotine  withdrawal). Patient not taking: Reported on 11/03/2023 11/01/23   Shrivastava, Aryendra, MD  ondansetron  (ZOFRAN -ODT) 4 MG disintegrating tablet Take 1 tablet (4 mg total) by mouth every 8 (eight) hours as needed for nausea or vomiting. Patient not taking: Reported on 11/03/2023 11/01/23   Shrivastava, Aryendra, MD    Allergies: Lisinopril     Review of Systems  Constitutional:  Negative for chills and fever.  HENT:  Negative for congestion and rhinorrhea.   Respiratory:  Negative for cough and shortness of breath.   Cardiovascular:  Negative  for chest pain.  Musculoskeletal:  Positive for myalgias.    Updated Vital Signs BP (!) 146/83   Pulse 66   Temp 97.7 F (36.5 C) (Oral)   Resp 18   Ht 5' 8 (1.727 m)   Wt 70.3 kg   SpO2 99%   BMI 23.57 kg/m   Physical Exam Vitals and nursing note reviewed.  Constitutional:      General: He is not in acute distress.    Appearance: He is not ill-appearing or toxic-appearing.  Eyes:      General: No scleral icterus. Pulmonary:     Effort: Pulmonary effort is normal. No respiratory distress.  Skin:    General: Skin is warm and dry.  Neurological:     Mental Status: He is alert.     Comments: Moving all extremities  Psychiatric:        Mood and Affect: Mood normal.     (all labs ordered are listed, but only abnormal results are displayed) Labs Reviewed  CK  CBC WITH DIFFERENTIAL/PLATELET  COMPREHENSIVE METABOLIC PANEL WITH GFR    EKG: None  Radiology: No results found.  Procedures   Medications Ordered in the ED - No data to display                              Medical Decision Making Amount and/or Complexity of Data Reviewed Labs: ordered.   Clinical Course as of 11/10/23 1206  Mon Nov 10, 2023  0944 Spoke with lab regarding delay in results. They are having trouble with the machine.  [RR]    Clinical Course User Index [RR] Bernis Ernst, [place]     63 y.o. male presents to the ER for evaluation of body aches. Differential diagnosis includes but is not limited to myalgia, rhabdomyolysis, electrolyte disorder. Vital signs mildly elevated BP, otherwise unremarkable. Physical exam as noted above.   I independently reviewed and interpreted the patient's labs. CMP glucose at 201 within normal gap.  Mildly decreased calcium  and albumin.  Total bili at 1.3 minimally elevated. CBC unremarkable. CK unremarkable.   Suspect possibly some secondary gain.  He came in requesting coffee and sandwich and blankets to sleep.  His workup is overall unremarkable, no emergent need for admission.  He was given food and drink here.  Supported him remain compliant with his medications for his diabetes.  No significant electrolyte abnormality, CK normal, no rhabdomyolysis.  Likely having bodyaches from sleeping position.  He is stable for discharge home.  We discussed the results of the labs/imaging. The plan is tylenol  as needed, shelter resources. We discussed strict return  precautions and red flag symptoms. The patient verbalized their understanding and agrees to the plan. The patient is stable and being discharged home in good condition.  Portions of this report may have been transcribed using voice recognition software. Every effort was made to ensure accuracy; however, inadvertent computerized transcription errors may be present.    Final diagnoses:  Myalgia  Hungry, initial encounter    ED Discharge Orders     None          Bernis Ernst, PA-C 11/10/23 1014    Bernis Ernst, [place] 11/10/23 1206    Tegeler, Lonni PARAS, MD 11/10/23 [phone]

## 2023-11-10 NOTE — ED Notes (Signed)
 Drew blood from patient, he is in NAD. Only complaint he has is that he has not gotten a [place] sandwich yet.

## 2023-11-10 NOTE — Discharge Instructions (Addendum)
 Contact a health care provider if: Your muscle pain gets worse, and medicines do not help. The muscle pain lasts longer than 3 days. You have a rash or fever. You have muscle pain after a tick bite. You have muscle pain when you work out, even though you are in good shape. You have redness, soreness, or swelling. You have muscle pain after you start a new medicine or change the dose of a medicine. Get help right away if: You have trouble breathing. You have trouble swallowing. You have muscle pain along with a stiff neck, fever, and vomiting. You have severe muscle weakness, or you cannot move part of your body. You are urinating less, or you have dark, bloody, or discolored urine. You have redness or swelling at the site of the muscle pain. These symptoms may be an emergency. Get help right away. Call 911. Do not wait to see if the symptoms will go away. Do not drive yourself to the hospital.

## 2023-11-11 ENCOUNTER — Other Ambulatory Visit: Payer: Self-pay

## 2023-11-11 ENCOUNTER — Emergency Department (HOSPITAL_COMMUNITY)
Admission: EM | Admit: 2023-11-11 | Discharge: 2023-11-11 | Disposition: A | Discharge status: Home / Self Care | Payer: MEDICAID | Attending: Emergency Medicine | Admitting: Emergency Medicine

## 2023-11-11 DIAGNOSIS — Z5900 Homelessness unspecified: Secondary | ICD-10-CM | POA: Insufficient documentation

## 2023-11-11 DIAGNOSIS — E1165 Type 2 diabetes mellitus with hyperglycemia: Secondary | ICD-10-CM | POA: Diagnosis not present

## 2023-11-11 DIAGNOSIS — R739 Hyperglycemia, unspecified: Secondary | ICD-10-CM | POA: Diagnosis present

## 2023-11-11 DIAGNOSIS — Z794 Long term (current) use of insulin: Secondary | ICD-10-CM | POA: Diagnosis not present

## 2023-11-11 DIAGNOSIS — Z7984 Long term (current) use of oral hypoglycemic drugs: Secondary | ICD-10-CM | POA: Diagnosis not present

## 2023-11-11 LAB — CBC
HCT: 37.1 % — ABNORMAL LOW (ref 39.0–52.0)
Hemoglobin: 11.7 g/dL — ABNORMAL LOW (ref 13.0–17.0)
MCH: 31 pg (ref 26.0–34.0)
MCHC: 31.5 g/dL (ref 30.0–36.0)
MCV: 98.1 fL (ref 80.0–100.0)
Platelets: 349 K/uL (ref 150–400)
RBC: 3.78 MIL/uL — ABNORMAL LOW (ref 4.22–5.81)
RDW: 13.1 % (ref 11.5–15.5)
WBC: 4.4 K/uL (ref 4.0–10.5)
nRBC: 0 % (ref 0.0–0.2)

## 2023-11-11 LAB — URINALYSIS, ROUTINE W REFLEX MICROSCOPIC
Bacteria, UA: NONE SEEN
Bilirubin Urine: NEGATIVE
Glucose, UA: >=500 mg/dL — AB
Hgb urine dipstick: NEGATIVE
Ketones, ur: NEGATIVE mg/dL
Leukocytes,Ua: NEGATIVE
Nitrite: NEGATIVE
Protein, ur: NEGATIVE mg/dL
Specific Gravity, Urine: 1.025 (ref 1.005–1.030)
pH: 6 (ref 5.0–8.0)

## 2023-11-11 LAB — COMPREHENSIVE METABOLIC PANEL WITH GFR
ALT: 21 U/L (ref 0–44)
AST: 29 U/L (ref 15–41)
Albumin: 3.1 g/dL — ABNORMAL LOW (ref 3.5–5.0)
Alkaline Phosphatase: 102 U/L (ref 38–126)
Anion gap: 13 (ref 5–15)
BUN: 11 mg/dL (ref 8–23)
CO2: 20 mmol/L — ABNORMAL LOW (ref 22–32)
Calcium: 8.8 mg/dL — ABNORMAL LOW (ref 8.9–10.3)
Chloride: 99 mmol/L (ref 98–111)
Creatinine, Ser: 1.21 mg/dL (ref 0.61–1.24)
GFR, Estimated: >60 mL/min (ref >60)
Glucose, Bld: 657 mg/dL (ref 70–99)
Potassium: 4.2 mmol/L (ref 3.5–5.1)
Sodium: 132 mmol/L — ABNORMAL LOW (ref 135–145)
Total Bilirubin: 0.7 mg/dL (ref 0.0–1.2)
Total Protein: 6.1 g/dL — ABNORMAL LOW (ref 6.5–8.1)

## 2023-11-11 LAB — CBG MONITORING, ED
Glucose-Capillary: >600 mg/dL (ref 70–99)
Glucose-Capillary: 422 mg/dL — ABNORMAL HIGH (ref 70–99)
Glucose-Capillary: 208 mg/dL — ABNORMAL HIGH (ref 70–99)

## 2023-11-11 LAB — I-STAT CHEM 8, ED
BUN: 11 mg/dL (ref 8–23)
Calcium, Ion: 1.17 mmol/L (ref 1.15–1.40)
Chloride: 102 mmol/L (ref 98–111)
Creatinine, Ser: 1 mg/dL (ref 0.61–1.24)
Glucose, Bld: 568 mg/dL (ref 70–99)
HCT: 34 % — ABNORMAL LOW (ref 39.0–52.0)
Hemoglobin: 11.6 g/dL — ABNORMAL LOW (ref 13.0–17.0)
Potassium: 3.9 mmol/L (ref 3.5–5.1)
Sodium: 138 mmol/L (ref 135–145)
TCO2: 23 mmol/L (ref 22–32)

## 2023-11-11 MED ORDER — SODIUM CHLORIDE 0.9 % IV BOLUS
1000.0000 mL | Freq: Once | INTRAVENOUS | Status: AC
  Administered 2023-11-11: 1000 mL via INTRAVENOUS

## 2023-11-11 MED ORDER — ACETAMINOPHEN 500 MG PO TABS
1000.0000 mg | ORAL_TABLET | Freq: Once | ORAL | Status: AC
  Administered 2023-11-11: 1000 mg via ORAL
  Filled 2023-11-11: qty 2

## 2023-11-11 MED ORDER — INSULIN ASPART 100 UNIT/ML IJ SOLN
10.0000 [IU] | Freq: Once | INTRAMUSCULAR | Status: AC
  Administered 2023-11-11: 10 [IU] via SUBCUTANEOUS

## 2023-11-11 NOTE — ED Provider Notes (Signed)
 [place] EMERGENCY DEPARTMENT AT [facility] Provider Note   CSN: [id number] Arrival date & time: 11/11/23  1748     Patient presents with: Hyperglycemia   David Wyatt is a 63 y.o. male.   63 year old male with prior medical history as detailed below presents for evaluation.  Patient with known history of poorly controlled diabetes, homelessness, cocaine abuse.  Patient arrives from [facility] for hyperglycemia.  Patient was eating [employer] peanut butter cups on arrival.  He has strawberry jam around his lips that he says was from a snack.  He reports that he has his diabetes medication including his insulin .  He did not take it.  He denies acute complaint.  He is asking for something to drink to wash down his snacks.  The history is provided by the patient and medical records.       Prior to Admission medications   Medication Sig Start Date End Date Taking? Authorizing Provider  Accu-Chek Softclix Lancets lancets Use to check blood sugar three times daily as directed 11/01/23   Shrivastava, Aryendra, MD  amLODipine  (NORVASC ) 10 MG tablet Take 1 tablet (10 mg total) by mouth daily. Patient not taking: Reported on 11/03/2023 11/01/23   Shrivastava, Aryendra, MD  atorvastatin  (LIPITOR) 40 MG tablet Take 1 tablet (40 mg total) by mouth daily. Patient not taking: Reported on 11/03/2023 11/01/23   Shrivastava, Aryendra, MD  Blood Glucose Monitoring Suppl (ACCU-CHEK GUIDE ME) w/Device KIT Use to test blood sugar 3 times daily. 11/01/23   Shrivastava, Aryendra, MD  doxepin  (SINEQUAN ) 25 MG capsule Take 1 capsule (25 mg total) by mouth at bedtime. Patient not taking: Reported on 11/03/2023 11/01/23   Shrivastava, Aryendra, MD  DULoxetine  (CYMBALTA ) 60 MG capsule Take 1 capsule (60 mg total) by mouth daily. Patient not taking: Reported on 11/03/2023 11/01/23   Shrivastava, Aryendra, MD  gabapentin  (NEURONTIN ) 300 MG capsule Take 1 capsule (300 mg total) by mouth 3 (three)  times daily. Patient not taking: Reported on 11/03/2023 11/01/23   Shrivastava, Aryendra, MD  glucose blood (ACCU-CHEK GUIDE TEST) test strip Use as instructed to check blood sugar three times daily 11/01/23   Shrivastava, Aryendra, MD  hydrOXYzine  (ATARAX ) 10 MG tablet Take 1 tablet (10 mg total) by mouth 3 (three) times daily as needed FOR ANXIETY. Patient not taking: Reported on 11/03/2023 11/01/23   Shrivastava, Aryendra, MD  insulin  aspart (NOVOLOG ) 100 UNIT/ML injection Inject 0-9 Units into the skin 3 (three) times daily with meals. Patient not taking: Reported on 11/03/2023 11/01/23   Shrivastava, Aryendra, MD  insulin  glargine (LANTUS  SOLOSTAR) 100 UNIT/ML Solostar Pen Inject 30 Units into the skin daily. Patient not taking: Reported on 11/03/2023 11/03/23   Newlin, Enobong, MD  lidocaine  (LIDODERM ) 5 % Place 1 patch onto the skin daily. Remove & Discard patch within 12 hours or as directed by MD Patient not taking: Reported on 11/03/2023 11/01/23   Shrivastava, Aryendra, MD  metFORMIN  (GLUCOPHAGE ) 500 MG tablet Take 1 tablet (500 mg total) by mouth 2 (two) times daily with a meal. 11/03/23   [name], PA  nicotine  (NICODERM CQ  - DOSED IN MG/24 HR) 7 mg/24hr patch Place 1 patch (7 mg total) onto the skin daily as needed (nicotine  withdrawal). Patient not taking: Reported on 11/03/2023 11/01/23   Shrivastava, Aryendra, MD  ondansetron  (ZOFRAN -ODT) 4 MG disintegrating tablet Take 1 tablet (4 mg total) by mouth every 8 (eight) hours as needed for nausea or vomiting. Patient not taking: Reported on 11/03/2023  11/01/23   Shrivastava, Aryendra, MD    Allergies: Lisinopril     Review of Systems  All other systems reviewed and are negative.   Updated Vital Signs BP (!) 142/75 (BP Location: Left Arm)   Pulse 79   Temp 98 F (36.7 C)   Resp 18   Ht 5' 8 (1.727 m)   Wt 68 kg   SpO2 97%   BMI 22.81 kg/m   Physical Exam Vitals and nursing note reviewed.  Constitutional:      General: He is  not in acute distress.    Appearance: Normal appearance. He is well-developed.  HENT:     Head: Normocephalic and atraumatic.  Eyes:     Conjunctiva/sclera: Conjunctivae normal.     Pupils: Pupils are equal, round, and reactive to light.  Cardiovascular:     Rate and Rhythm: Normal rate and regular rhythm.     Heart sounds: Normal heart sounds.  Pulmonary:     Effort: Pulmonary effort is normal. No respiratory distress.     Breath sounds: Normal breath sounds.  Abdominal:     General: There is no distension.     Palpations: Abdomen is soft.     Tenderness: There is no abdominal tenderness.  Musculoskeletal:        General: No deformity. Normal range of motion.     Cervical back: Normal range of motion and neck supple.  Skin:    General: Skin is warm and dry.  Neurological:     General: No focal deficit present.     Mental Status: He is alert and oriented to person, place, and time.     (all labs ordered are listed, but only abnormal results are displayed) Labs Reviewed  CBG MONITORING, ED - Abnormal; Notable for the following components:      Result Value   Glucose-Capillary >600 (*)    All other components within normal limits  COMPREHENSIVE METABOLIC PANEL WITH GFR  CBC  URINALYSIS, ROUTINE W REFLEX MICROSCOPIC    EKG: None  Radiology: No results found.   Procedures   Medications Ordered in the ED  insulin  aspart (novoLOG ) injection 10 Units (has no administration in time range)  sodium chloride  0.9 % bolus 1,000 mL (1,000 mLs Intravenous New Bag/Given 11/11/23 1841)                                    Medical Decision Making Patient is homeless, with history of diabetes.  Patient with history of noncompliance.  He reports that he has access to his medications for diabetes.  He simply did not take them today.  On arrival the patient is eating [employer] peanut butter cups and peanut butter and jam.  Patient's sugar is elevated.  There is no evidence of other  metabolic abnormality related to his hyperglycemia.  With treatment, his hyperglycemia is improved significantly.  He is appropriate for discharge.  Importance of close follow-up is stressed.  Patient is advised to take his medications.  Amount and/or Complexity of Data Reviewed Labs: ordered.  Risk OTC drugs. Prescription drug management.        Final diagnoses:  Hyperglycemia    ED Discharge Orders     None          Laurice Maude BROCKS, MD 11/11/23 [phone]

## 2023-11-11 NOTE — ED Notes (Signed)
 Went to DC patient, with tylenol  that was ordered, and patient jumped up towards staff, kicking at staff yelling  Where's my pain medicine? I advised I had it and that he needed to calm down.

## 2023-11-11 NOTE — ED Notes (Signed)
 As patient was being escorted out by security he stated to this writer I wish I would catch you outside, I would beat your ass.

## 2023-11-11 NOTE — ED Triage Notes (Signed)
 [facility].  EMS found to be 489. Complains of freq urination and excessive thirst.  Pt is homeless. PT is eating [employer] PB cups on arrival. PT endorsees smoking crack/cocaine 2 days ago.  12 lead normal, no CP.  140/70 HR 88 94% RA.  20G L. FA. EMs gave 350 NS

## 2023-11-11 NOTE — Discharge Instructions (Addendum)
 Return for any problem.  Take your insulin  and other medications as previously instructed.  This will help keep your sugar lower.

## 2023-11-11 NOTE — ED Notes (Signed)
 Patient provided urinal for specimen collection

## 2023-11-13 ENCOUNTER — Other Ambulatory Visit: Payer: Self-pay

## 2023-11-14 ENCOUNTER — Encounter (HOSPITAL_COMMUNITY): Payer: Self-pay | Admitting: *Deleted

## 2023-11-14 ENCOUNTER — Ambulatory Visit (HOSPITAL_COMMUNITY)
Admission: EM | Admit: 2023-11-14 | Discharge: 2023-11-14 | Disposition: A | Discharge status: Home / Self Care | Payer: MEDICAID | Attending: Psychiatry | Admitting: Psychiatry

## 2023-11-14 ENCOUNTER — Other Ambulatory Visit: Payer: Self-pay

## 2023-11-14 ENCOUNTER — Emergency Department (HOSPITAL_COMMUNITY)
Admission: EM | Admit: 2023-11-14 | Discharge: 2023-11-15 | Disposition: A | Discharge status: Home / Self Care | Payer: MEDICAID | Attending: Emergency Medicine | Admitting: Emergency Medicine

## 2023-11-14 DIAGNOSIS — Z79899 Other long term (current) drug therapy: Secondary | ICD-10-CM | POA: Diagnosis not present

## 2023-11-14 DIAGNOSIS — F1410 Cocaine abuse, uncomplicated: Secondary | ICD-10-CM | POA: Insufficient documentation

## 2023-11-14 DIAGNOSIS — Z794 Long term (current) use of insulin: Secondary | ICD-10-CM | POA: Insufficient documentation

## 2023-11-14 DIAGNOSIS — Z7984 Long term (current) use of oral hypoglycemic drugs: Secondary | ICD-10-CM | POA: Diagnosis not present

## 2023-11-14 DIAGNOSIS — E1165 Type 2 diabetes mellitus with hyperglycemia: Secondary | ICD-10-CM | POA: Diagnosis not present

## 2023-11-14 DIAGNOSIS — M791 Myalgia, unspecified site: Secondary | ICD-10-CM | POA: Diagnosis present

## 2023-11-14 DIAGNOSIS — R6889 Other general symptoms and signs: Secondary | ICD-10-CM

## 2023-11-14 DIAGNOSIS — I10 Essential (primary) hypertension: Secondary | ICD-10-CM | POA: Diagnosis not present

## 2023-11-14 DIAGNOSIS — Z5900 Homelessness unspecified: Secondary | ICD-10-CM | POA: Insufficient documentation

## 2023-11-14 DIAGNOSIS — R5381 Other malaise: Secondary | ICD-10-CM | POA: Diagnosis not present

## 2023-11-14 DIAGNOSIS — E119 Type 2 diabetes mellitus without complications: Secondary | ICD-10-CM | POA: Insufficient documentation

## 2023-11-14 DIAGNOSIS — R413 Other amnesia: Secondary | ICD-10-CM | POA: Insufficient documentation

## 2023-11-14 DIAGNOSIS — R739 Hyperglycemia, unspecified: Secondary | ICD-10-CM

## 2023-11-14 DIAGNOSIS — Z765 Malingerer [conscious simulation]: Secondary | ICD-10-CM | POA: Insufficient documentation

## 2023-11-14 LAB — COMPREHENSIVE METABOLIC PANEL WITH GFR
ALT: 19 U/L (ref 0–44)
AST: 18 U/L (ref 15–41)
Albumin: 3.2 g/dL — ABNORMAL LOW (ref 3.5–5.0)
Alkaline Phosphatase: 92 U/L (ref 38–126)
Anion gap: 10 (ref 5–15)
BUN: 17 mg/dL (ref 8–23)
CO2: 25 mmol/L (ref 22–32)
Calcium: 9.4 mg/dL (ref 8.9–10.3)
Chloride: 102 mmol/L (ref 98–111)
Creatinine, Ser: 1.29 mg/dL — ABNORMAL HIGH (ref 0.61–1.24)
GFR, Estimated: >60 mL/min
Glucose, Bld: 336 mg/dL — ABNORMAL HIGH (ref 70–99)
Potassium: 4.5 mmol/L (ref 3.5–5.1)
Sodium: 137 mmol/L (ref 135–145)
Total Bilirubin: 0.8 mg/dL (ref 0.0–1.2)
Total Protein: 6.9 g/dL (ref 6.5–8.1)

## 2023-11-14 LAB — I-STAT CHEM 8, ED
BUN: 19 mg/dL (ref 8–23)
Calcium, Ion: 1.25 mmol/L (ref 1.15–1.40)
Chloride: 105 mmol/L (ref 98–111)
Creatinine, Ser: 1.3 mg/dL — ABNORMAL HIGH (ref 0.61–1.24)
Glucose, Bld: 338 mg/dL — ABNORMAL HIGH (ref 70–99)
HCT: 40 % (ref 39.0–52.0)
Hemoglobin: 13.6 g/dL (ref 13.0–17.0)
Potassium: 4.5 mmol/L (ref 3.5–5.1)
Sodium: 137 mmol/L (ref 135–145)
TCO2: 22 mmol/L (ref 22–32)

## 2023-11-14 LAB — CBC
HCT: 38.9 % — ABNORMAL LOW (ref 39.0–52.0)
Hemoglobin: 12.5 g/dL — ABNORMAL LOW (ref 13.0–17.0)
MCH: 30.9 pg (ref 26.0–34.0)
MCHC: 32.1 g/dL (ref 30.0–36.0)
MCV: 96 fL (ref 80.0–100.0)
Platelets: 335 K/uL (ref 150–400)
RBC: 4.05 MIL/uL — ABNORMAL LOW (ref 4.22–5.81)
RDW: 13.1 % (ref 11.5–15.5)
WBC: 6.8 K/uL (ref 4.0–10.5)
nRBC: 0 % (ref 0.0–0.2)

## 2023-11-14 LAB — RESP PANEL BY RT-PCR (RSV, FLU A&B, COVID)  RVPGX2
Influenza A by PCR: NEGATIVE
Influenza B by PCR: NEGATIVE
Resp Syncytial Virus by PCR: NEGATIVE
SARS Coronavirus 2 by RT PCR: NEGATIVE

## 2023-11-14 NOTE — Progress Notes (Signed)
   11/14/23 1932  [facility] Triage Screening (Walk-ins at [facility] only)  How Did You Hear About Us ? Legal System  What Is the Reason for Your Visit/Call Today? Mr Bonsell, arrived with BHRT and [employer]. He is seeking detox and mental services stating that he wants to get his life together. He has no family that is around and he states that he has nobody to speak his problems out with and that it motivates him to use. HGe has memory issues. He is diabetic and is medicated but has not been taking it. He admoits to using crack and has been using daily.and he is currently homeless. He states that he is just truying to get help to get himself on the right track.  How Long Has This Been Causing You Problems? > than 6 months (Years)  Have You Recently Had Any Thoughts About Hurting Yourself? Yes (States he tries not to think those thoughts.)  Are You Planning to Commit Suicide/Harm Yourself At This time? No  Have you Recently Had Thoughts About Hurting Someone [name]? No (States he tries not to think those thoughts.)  Are You Planning To Harm Someone At This Time? No  Physical Abuse Denies  Verbal Abuse Denies  Sexual Abuse Denies  Exploitation of patient/patient's resources Denies  Self-Neglect Denies  Are you currently experiencing any auditory, visual or other hallucinations? Yes  Please explain the hallucinations you are currently experiencing: Sees people walking but they aren't there.  Have You Used Any Alcohol or Drugs in the Past 24 Hours? No (25 hrs ago)  Do you have any current medical co-morbidities that require immediate attention? Yes  Please describe current medical co-morbidities that require immediate attention: Diabetic high cholesterol, and high blood prressure  Clinician description of patient physical appearance/behavior: Fatigued  What Do You Feel Would Help You the Most Today? Medication(s);Social Support;Stress Management;Alcohol or Drug Use Treatment  If access to [facility] Urgent Care was not  available, would you have sought care in the Emergency Department? Yes  Determination of Need Urgent (48 hours)  Options For Referral [facility] Urgent Care;Therapeutic Triage Services;Other: Comment;Facility-Based Crisis  Determination of Need filed? Yes

## 2023-11-14 NOTE — ED Notes (Signed)
NA from lobby x3

## 2023-11-14 NOTE — ED Triage Notes (Signed)
 Pt just release from [facility] today and pt arrive to ED states he has generalized body aches and is not feeling well.

## 2023-11-14 NOTE — Discharge Instructions (Addendum)
   Discharge recommendations:  Patient is to take medications as prescribed. Please see information for follow-up appointment with psychiatry and therapy. Please follow up with your primary care provider for all medical related needs.   Therapy: We recommend that patient participate in individual therapy to address mental health concerns.  Medications: The patient or guardian is to contact a medical professional and/or outpatient provider to address any new side effects that develop. The patient or guardian should update outpatient providers of any new medications and/or medication changes.   Atypical antipsychotics: If you are prescribed an atypical antipsychotic, it is recommended that your height, weight, BMI, blood pressure, fasting lipid panel, and fasting blood sugar be monitored by your outpatient providers.  Safety:  The patient should abstain from use of illicit substances/drugs and abuse of any medications. If symptoms worsen or do not continue to improve or if the patient becomes actively suicidal or homicidal then it is recommended that the patient return to the closest hospital emergency department, the [facility], or call 911 for further evaluation and treatment. National Suicide Prevention Lifeline 1-800-SUICIDE or 260 155 3439.  About 988 988 offers 24/7 access to trained crisis counselors who can help people experiencing mental health-related distress. People can call or text 988 or chat 988lifeline.org for themselves or if they are worried about a loved one who may need crisis support.  Crisis Mobile: [facility]:                     419-443-4370 (for crisis response 24 hours a day) [facility] Hotline:                                            765-652-3678.

## 2023-11-15 ENCOUNTER — Ambulatory Visit (HOSPITAL_COMMUNITY)
Admission: EM | Admit: 2023-11-15 | Discharge: 2023-11-15 | Disposition: A | Discharge status: Home / Self Care | Payer: MEDICAID | Source: Intra-hospital

## 2023-11-15 DIAGNOSIS — F1410 Cocaine abuse, uncomplicated: Secondary | ICD-10-CM

## 2023-11-15 DIAGNOSIS — Z5900 Homelessness unspecified: Secondary | ICD-10-CM

## 2023-11-15 DIAGNOSIS — Z765 Malingerer [conscious simulation]: Secondary | ICD-10-CM

## 2023-11-15 MED ORDER — AMLODIPINE BESYLATE 5 MG PO TABS
10.0000 mg | ORAL_TABLET | Freq: Every day | ORAL | Status: DC
  Administered 2023-11-15: 10 mg via ORAL
  Filled 2023-11-15: qty 2

## 2023-11-15 NOTE — ED Notes (Signed)
 Patient discharged home. AVS reviewed with patient via staff. Patient belongings returned complete and intact. Patient escorted to lobby via staff. Safety maintained.

## 2023-11-15 NOTE — ED Provider Notes (Signed)
 Behavioral Health Urgent Care Medical Screening Exam  Patient Name: David Wyatt MRN: [id number] Date of Evaluation: 11/15/23 Chief Complaint:   Diagnosis:  Final diagnoses:  Cocaine abuse (HCC)  Malingering    History of Present illness: David Wyatt is a 63 y.o. male with history ofdepression, suicidal ideation, and cocaine use disorder who presents voluntarily to [facility] requesting "detox" and substance abuse treatment.   Patient seen face to face and chart reviewed by this NP. On assessment, patient reports recent crack cocaine use but is unable to specify the date or time of last use. He denies any substance use in the past 24 hours. He endorses feeling tired, experiencing chronic back pain of 5/10, and wants a place to sleep.  During the assessment, the patient was drowsy and required multiple prompts to maintain alertness. He was minimally cooperative and offered very little verbal responses. He became irritable when prompted to stay awake to participate in assessment. He endorses chronic auditory hallucination which is only present during cocaine use. He denies any recent worsening of hallucination, and also denies suicidal ideation, homicidal ideation, or paranoia.  On examination, patient appeared disheveled. He is drowsy throughout the assessment. He required multiple prompts to stay awake and was minimally engaged. He offered brief and limited responses. His speech was slow and of low volume. His mood is irritable, and his affect was blunted. Thought processes were linear. He denied suicidal or homicidal ideation, paranoid thoughts, or delusions. He endorsed chronic auditory hallucinations, which he reports are typically present during cocaine use. Insight was limited, and judgment appeared impaired.   Patient's primary motivation for seeking treatment at this time appears to be related to housing/shelter and need for rest. No overt withdrawal symptoms observed or reported at the  time of assessment.  No evidence of imminent danger to self or others at this time. Patient does not meet criteria for psychiatric admission or IVC. Supportive therapy provided about ongoing stressors. Discussed crisis plan, callling 911/988 or going to Emergency Dept    Flowsheet Row ED from 11/14/2023 in [facility] Emergency Department at [facility] ED from 11/11/2023 in [facility] Emergency Department at [facility] ED from 11/10/2023 in [facility] Emergency Department at [facility]  C-SSRS RISK CATEGORY No Risk Error: Q7 should not be populated when Q6 is No No Risk    Psychiatric Specialty Exam  Presentation  General Appearance:Casual  Eye Contact:Good  Speech:Clear and Coherent  Speech Volume:Normal  Handedness:Right   Mood and Affect  Mood: Irritable  Affect: Congruent   Thought Process  Thought Processes: Coherent  Descriptions of Associations:Intact  Orientation:Full (Time, Place and Person)  Thought Content:WDL  Diagnosis of Schizophrenia or Schizoaffective disorder in past: No   Hallucinations:None  Ideas of Reference:None  Suicidal Thoughts:No Without Intent; Without Plan  Homicidal Thoughts:No   Sensorium  Memory: Immediate Fair; Remote Fair; Recent Fair  Judgment: Poor  Insight: Good   Executive Functions  Concentration: Fair  Attention Span: Fair  Recall: [employer] of Knowledge: Fair  Language: Fair   Psychomotor Activity  Psychomotor Activity: Normal   Assets  Assets: Desire for Improvement   Sleep  Sleep: Fair  Number of hours:  6   Physical Exam: Physical Exam Vitals and nursing note reviewed.  Constitutional:      General: He is not in acute distress.    Appearance: He is well-developed. He is not ill-appearing or diaphoretic.  HENT:     Head: Normocephalic and atraumatic.  Eyes:     Conjunctiva/sclera: Conjunctivae normal.  Cardiovascular:     Rate and Rhythm:  Normal rate.  Pulmonary:     Effort: Pulmonary effort is normal.  Abdominal:     Palpations: Abdomen is soft.  Musculoskeletal:        General: No swelling. Normal range of motion.     Cervical back: Neck rigidity: c/o back pain.  Neurological:     Mental Status: He is alert and oriented to person, place, and time.  Psychiatric:        Attention and Perception: Attention normal.        Mood and Affect: Mood normal.        Speech: Noncommunicative: slow speech.        Behavior: Behavior is uncooperative and slowed.        Thought Content: Thought content normal. Thought content is not paranoid or delusional. Thought content does not include homicidal or suicidal ideation. Thought content does not include homicidal or suicidal plan.    Review of Systems  Constitutional: Negative.   HENT: Negative.    Eyes: Negative.   Respiratory: Negative.    Cardiovascular: Negative.   Gastrointestinal: Negative.   Genitourinary: Negative.   Musculoskeletal: Negative.   Skin: Negative.   Neurological: Negative.   Endo/Heme/Allergies: Negative.   Psychiatric/Behavioral:  Positive for hallucinations and substance abuse.    Blood pressure (!) 147/87, pulse 74, temperature 99.3 F (37.4 C), resp. rate 16, SpO2 98%. There is no height or weight on file to calculate BMI.  Musculoskeletal: Strength & Muscle Tone: within normal limits Gait & Station: normal Patient leans: Right   [facility] MSE Discharge Disposition for Follow up and Recommendations: Based on my evaluation the patient does not appear to have an emergency medical condition and can be discharged with resources and follow up care in outpatient services for Medication Management, Substance Abuse Intensive Outpatient Program, and Individual Therapy  Patient was provided with resources for outpatient substance abuse program and mental health services for continued treatment of cocaine use disorder and chronic psychiatric symptoms.   David DELENA Show, NP 11/15/2023, 5:03 AM

## 2023-11-15 NOTE — Progress Notes (Signed)
   11/15/23 0807  [facility] Triage Screening (Walk-ins at [facility] only)  How Did You Hear About Us ? Legal System  What Is the Reason for Your Visit/Call Today? [name] 63Y male arrived at [facility] via [employer], voluntarily. PT called [employer] after being discharged from the hospital, stated that he needed help. PT stated that he is diagnosed with depression and takes his medications daily.  No hx of suicide attempts. PT reports to using crack everyday, last used two days ago. PT states that he wants to detox from crack and get his life together. PT denies SI, HI and AVH.  How Long Has This Been Causing You Problems? > than 6 months  Have You Recently Had Any Thoughts About Hurting Yourself? No  Are You Planning to Commit Suicide/Harm Yourself At This time? No  Have you Recently Had Thoughts About Hurting Someone David Wyatt? No  Are You Planning To Harm Someone At This Time? No  Self-Neglect Yes, present (Comment)  Are you currently experiencing any auditory, visual or other hallucinations? No  Have You Used Any Alcohol or Drugs in the Past 24 Hours? No (Last used crack, two days ago)  Please describe current medical co-morbidities that require immediate attention: Per pt : hypertension, diabetes type 2, high cholesterol (not immediate attention)  Clinician description of patient physical appearance/behavior: cooperative, hospital blanket, unkempt appearance  What Do You Feel Would Help You the Most Today? Alcohol or Drug Use Treatment  Determination of Need Urgent (48 hours)  Options For Referral Chemical Dependency Intensive Outpatient Therapy (CDIOP);BH Urgent Care;Facility-Based Crisis;Intensive Outpatient Therapy;Medication Management  Determination of Need filed? Yes

## 2023-11-15 NOTE — ED Provider Notes (Signed)
 [place] EMERGENCY DEPARTMENT AT [facility] Provider Note   CSN: [id number] Arrival date & time: 11/14/23  2200     Patient presents with: Generalized Body Aches   David Wyatt is a 63 y.o. male.   63 year old male with a history of hypertension, diabetes, cocaine abuse, chronic pain presents to the emergency department reporting that he does not feel well.  He is requesting to be checked out.  He is unable to expand on these complaints.  Will not engage in conversation with provider; his more intent on following back to sleep.  The history is provided by the patient. No language interpreter was used.       Prior to Admission medications   Medication Sig Start Date End Date Taking? Authorizing Provider  Accu-Chek Softclix Lancets lancets Use to check blood sugar three times daily as directed 11/01/23   Shrivastava, Aryendra, MD  amLODipine  (NORVASC ) 10 MG tablet Take 1 tablet (10 mg total) by mouth daily. Patient not taking: Reported on 11/03/2023 11/01/23   Shrivastava, Aryendra, MD  atorvastatin  (LIPITOR) 40 MG tablet Take 1 tablet (40 mg total) by mouth daily. Patient not taking: Reported on 11/03/2023 11/01/23   Shrivastava, Aryendra, MD  Blood Glucose Monitoring Suppl (ACCU-CHEK GUIDE ME) w/Device KIT Use to test blood sugar 3 times daily. 11/01/23   Shrivastava, Aryendra, MD  doxepin  (SINEQUAN ) 25 MG capsule Take 1 capsule (25 mg total) by mouth at bedtime. Patient not taking: Reported on 11/03/2023 11/01/23   Shrivastava, Aryendra, MD  DULoxetine  (CYMBALTA ) 60 MG capsule Take 1 capsule (60 mg total) by mouth daily. Patient not taking: Reported on 11/03/2023 11/01/23   Shrivastava, Aryendra, MD  gabapentin  (NEURONTIN ) 300 MG capsule Take 1 capsule (300 mg total) by mouth 3 (three) times daily. Patient not taking: Reported on 11/03/2023 11/01/23   Shrivastava, Aryendra, MD  glucose blood (ACCU-CHEK GUIDE TEST) test strip Use as instructed to check blood sugar three times  daily 11/01/23   Shrivastava, Aryendra, MD  hydrOXYzine  (ATARAX ) 10 MG tablet Take 1 tablet (10 mg total) by mouth 3 (three) times daily as needed FOR ANXIETY. Patient not taking: Reported on 11/03/2023 11/01/23   Shrivastava, Aryendra, MD  insulin  aspart (NOVOLOG ) 100 UNIT/ML injection Inject 0-9 Units into the skin 3 (three) times daily with meals. Patient not taking: Reported on 11/03/2023 11/01/23   Shrivastava, Aryendra, MD  insulin  glargine (LANTUS  SOLOSTAR) 100 UNIT/ML Solostar Pen Inject 30 Units into the skin daily. Patient not taking: Reported on 11/03/2023 11/03/23   Newlin, Enobong, MD  lidocaine  (LIDODERM ) 5 % Place 1 patch onto the skin daily. Remove & Discard patch within 12 hours or as directed by MD Patient not taking: Reported on 11/03/2023 11/01/23   Shrivastava, Aryendra, MD  metFORMIN  (GLUCOPHAGE ) 500 MG tablet Take 1 tablet (500 mg total) by mouth 2 (two) times daily with a meal. 11/03/23   [name], PA  nicotine  (NICODERM CQ  - DOSED IN MG/24 HR) 7 mg/24hr patch Place 1 patch (7 mg total) onto the skin daily as needed (nicotine  withdrawal). Patient not taking: Reported on 11/03/2023 11/01/23   Shrivastava, Aryendra, MD  ondansetron  (ZOFRAN -ODT) 4 MG disintegrating tablet Take 1 tablet (4 mg total) by mouth every 8 (eight) hours as needed for nausea or vomiting. Patient not taking: Reported on 11/03/2023 11/01/23   Shrivastava, Aryendra, MD    Allergies: Lisinopril     Review of Systems Ten systems reviewed and are negative for acute change, except as noted in the HPI.  Updated Vital Signs BP (!) 172/108 (BP Location: Right Arm)   Pulse 68   Temp 98.5 F (36.9 C)   Resp 20   Ht 5' 8 (1.727 m)   Wt 68 kg   SpO2 100%   BMI 22.79 kg/m   Physical Exam Vitals and nursing note reviewed.  Constitutional:      General: He is not in acute distress.    Appearance: He is well-developed. He is not diaphoretic.     Comments: Nontoxic appearing and in NAD  HENT:     Head:  Normocephalic and atraumatic.  Eyes:     General: No scleral icterus.    Conjunctiva/sclera: Conjunctivae normal.  Pulmonary:     Effort: Pulmonary effort is normal. No respiratory distress.     Comments: Respirations even and unlabored Musculoskeletal:        General: Normal range of motion.     Cervical back: Normal range of motion.  Skin:    General: Skin is warm and dry.     Coloration: Skin is not pale.     Findings: No erythema or rash.  Neurological:     Mental Status: He is alert and oriented to person, place, and time.     Coordination: Coordination normal.     Comments: Moving extremities spontaneously, symmetric  Psychiatric:        Behavior: Behavior normal.     (all labs ordered are listed, but only abnormal results are displayed) Labs Reviewed  COMPREHENSIVE METABOLIC PANEL WITH GFR - Abnormal; Notable for the following components:      Result Value   Glucose, Bld 336 (*)    Creatinine, Ser 1.29 (*)    Albumin 3.2 (*)    All other components within normal limits  CBC - Abnormal; Notable for the following components:   RBC 4.05 (*)    Hemoglobin 12.5 (*)    HCT 38.9 (*)    All other components within normal limits  I-STAT CHEM 8, ED - Abnormal; Notable for the following components:   Creatinine, Ser 1.30 (*)    Glucose, Bld 338 (*)    All other components within normal limits  RESP PANEL BY RT-PCR (RSV, FLU A&B, COVID)  RVPGX2  URINALYSIS, ROUTINE W REFLEX MICROSCOPIC    EKG: None  Radiology: No results found.   Procedures   Medications Ordered in the ED  amLODipine  (NORVASC ) tablet 10 mg (has no administration in time range)                                    Medical Decision Making Risk Prescription drug management.   This patient presents to the ED for concern of feeling unwell, this involves an extensive number of treatment options, and is a complaint that carries with it a high risk of complications and morbidity.  The differential  diagnosis includes dehydration vs DKA vs viral illness vs substance use/abuse/intoxication vs hypoglycemia.   Co morbidities that complicate the patient evaluation  HTN DM Polysubstance use HTN   Lab Tests:  I Ordered, and personally interpreted labs.  The pertinent results include:  Glucose 336. Creatinine 1.29. COVID and RSV negative.   Cardiac Monitoring:  The patient was maintained on a cardiac monitor.  I personally viewed and interpreted the cardiac monitored which showed an underlying rhythm of: NSR   Medicines ordered and prescription drug management:  I have reviewed the patients home  medicines and have made adjustments as needed   Test Considered:  UDS   Problem List / ED Course:  63 year old male, well-known to the emergency department complaining for generalized complaints of feeling unwell. Content to sleep throughout much of encounter.  Upon waking, is nontoxic appearing and in no distress. Per chart review, seen 28 times in the ED over the past 6 months. Most recently on 11/11/23. Subsequently presented to the behavioral urgent care yesterday evening and was discharged. Behavior suspicious for malingering. Do not feel additional emergent evaluation is warranted.   Reevaluation:  After the interventions noted above, I reevaluated the patient and found that they have :stayed the same   Social Determinants of Health:  Ambulatory with assist device Homeless    Dispostion:  After consideration of the diagnostic results and the patients response to treatment, I feel that the patent would benefit from outpatient PCP f/u. Return precautions discussed and provided. Patient discharged in stable condition with no unaddressed concerns.      Final diagnoses:  Feeling unwell  Hyperglycemia    ED Discharge Orders     None          Keith Sor, PA-C 11/15/23 0505    Raford Lenis, MD 11/15/23 [phone]

## 2023-11-15 NOTE — Discharge Instructions (Addendum)
 Follow up with your primary care doctor for any ongoing complaints. Continue your daily prescribed medications.

## 2023-11-15 NOTE — ED Provider Notes (Signed)
 Behavioral Health Urgent Care Medical Screening Exam  Patient Name: David Wyatt MRN: [id number] Date of Evaluation: 11/15/23 Chief Complaint:  I want some help Diagnosis:  Final diagnoses:  Cocaine use disorder (HCC)  Homelessness  Malingering    History of Present illness: David Wyatt is a 63 y.o. male, history cocaine use, homelessness, and malingering, presents here to [facility] for the second time in less than 12 hours. Patient was seen last night requested inpatient treatment to detox from cocaine use. Patient has an extensive history of over utilization of emergency services . Patient was discharged from [name] ED and came directly here to [facility]. Patient is diagnosed with poorly controlled diabetes, and has been lost to primary care follow-up. Per chart review, transitions of care and congregational nursing have made extensive efforts to schedule follow-up outpatient appointments for patient. Patient has been presented to [facility] per chart review, several times previously, has been admitted to [facility], and has not successful benefited from facility based treatment. Patient also frequently presents to ER seeking primary care services and shelter. Patient here overnight requesting detox for cocaine. Patient has been educated previously that there is no FDA approved detox for cocaine use and prior admission have not benefited patient in abstaining from substance use as his request for treatment is in the context of seeking shelter. Patient is not homicidal, suicidal, or psychotic and does not meet criteria for inpatient psychiatric treatment. Flowsheet Row ED from 11/14/2023 in [facility] Emergency Department at [facility] ED from 11/11/2023 in [facility] Emergency Department at [facility] ED from 11/10/2023 in [facility] Emergency Department at [facility]  C-SSRS RISK CATEGORY No Risk Error: Q7 should not be populated when Q6 is No No Risk    Psychiatric Specialty  Exam  Presentation  General Appearance:Casual  Eye Contact:Good  Speech:Clear and Coherent  Speech Volume:Normal  Handedness:Right   Mood and Affect  Mood: Irritable  Affect: Congruent   Thought Process  Thought Processes: Coherent  Descriptions of Associations:Intact  Orientation:Full (Time, Place and Person)  Thought Content:WDL  Diagnosis of Schizophrenia or Schizoaffective disorder in past: No   Hallucinations:None  Ideas of Reference:None  Suicidal Thoughts:No Without Intent; Without Plan  Homicidal Thoughts:No   Sensorium  Memory: Immediate Fair; Remote Fair; Recent Fair  Judgment: Poor  Insight: Good   Executive Functions  Concentration: Fair  Attention Span: Fair  Recall: [employer] of Knowledge: Fair  Language: Fair   Psychomotor Activity  Psychomotor Activity: Normal   Assets  Assets: Desire for Improvement   Sleep  Sleep: Fair  Number of hours:  6   Physical Exam: Physical Exam Vitals reviewed.  Constitutional:      General: He is not in acute distress.    Appearance: Normal appearance. He is not ill-appearing.  HENT:     Head: Normocephalic and atraumatic.     Nose: Nose normal.  Eyes:     Extraocular Movements: Extraocular movements intact.     Conjunctiva/sclera: Conjunctivae normal.     Pupils: Pupils are equal, round, and reactive to light.  Cardiovascular:     Rate and Rhythm: Normal rate and regular rhythm.  Pulmonary:     Effort: Pulmonary effort is normal.     Breath sounds: Normal breath sounds.  Musculoskeletal:     Cervical back: Normal range of motion and neck supple.  Skin:    General: Skin is warm and dry.  Neurological:     General: No  focal deficit present.     Mental Status: He is alert and oriented to person, place, and time.     Review of Systems  Psychiatric/Behavioral:  Positive for substance abuse.      Blood pressure (!) 133/99, pulse 89, temperature 98.5 F  (36.9 C), temperature source Oral, resp. rate 16, SpO2 98%. There is no height or weight on file to calculate BMI.  Musculoskeletal: Strength & Muscle Tone: within normal limits Gait & Station: normal Patient leans: N/A   [facility] MSE Discharge Disposition for Follow up and Recommendations: Based on my evaluation the patient does not appear to have an emergency medical condition and can be discharged with resources and follow up care in outpatient services for : Confirmed with TTS provider, CDIOP next session 9/15 @ 9 am. Discussed with patient CDIOP, patient verbalized that he will attend treatment and information provided on AVS.  Disposition: Home to Self    Suzen Lesches, NP 11/15/2023, 8:43 AM

## 2023-11-16 ENCOUNTER — Ambulatory Visit (HOSPITAL_COMMUNITY)
Admission: EM | Admit: 2023-11-16 | Discharge: 2023-11-16 | Disposition: A | Discharge status: Home / Self Care | Payer: MEDICAID

## 2023-11-16 ENCOUNTER — Emergency Department (HOSPITAL_COMMUNITY)
Admission: EM | Admit: 2023-11-16 | Discharge: 2023-11-16 | Disposition: A | Discharge status: Home / Self Care | Payer: MEDICAID | Attending: Emergency Medicine | Admitting: Emergency Medicine

## 2023-11-16 DIAGNOSIS — N179 Acute kidney failure, unspecified: Secondary | ICD-10-CM | POA: Diagnosis not present

## 2023-11-16 DIAGNOSIS — R197 Diarrhea, unspecified: Secondary | ICD-10-CM | POA: Insufficient documentation

## 2023-11-16 DIAGNOSIS — R112 Nausea with vomiting, unspecified: Secondary | ICD-10-CM | POA: Diagnosis present

## 2023-11-16 DIAGNOSIS — Z7984 Long term (current) use of oral hypoglycemic drugs: Secondary | ICD-10-CM | POA: Insufficient documentation

## 2023-11-16 LAB — I-STAT CHEM 8, ED
BUN: 36 mg/dL — ABNORMAL HIGH (ref 8–23)
Calcium, Ion: 1.13 mmol/L — ABNORMAL LOW (ref 1.15–1.40)
Chloride: 106 mmol/L (ref 98–111)
Creatinine, Ser: 1.7 mg/dL — ABNORMAL HIGH (ref 0.61–1.24)
Glucose, Bld: 172 mg/dL — ABNORMAL HIGH (ref 70–99)
HCT: 45 % (ref 39.0–52.0)
Hemoglobin: 15.3 g/dL (ref 13.0–17.0)
Potassium: 4.5 mmol/L (ref 3.5–5.1)
Sodium: 139 mmol/L (ref 135–145)
TCO2: 23 mmol/L (ref 22–32)

## 2023-11-16 MED ORDER — SODIUM CHLORIDE 0.9 % IV BOLUS
1000.0000 mL | Freq: Once | INTRAVENOUS | Status: AC
  Administered 2023-11-16: 1000 mL via INTRAVENOUS

## 2023-11-16 MED ORDER — ONDANSETRON 4 MG PO TBDP
ORAL_TABLET | ORAL | 0 refills | Status: DC
  Filled 2023-11-16: qty 20, 4d supply, fill #0

## 2023-11-16 MED ORDER — ONDANSETRON 4 MG PO TBDP
4.0000 mg | ORAL_TABLET | Freq: Once | ORAL | Status: AC
  Administered 2023-11-16: 4 mg via ORAL
  Filled 2023-11-16: qty 1

## 2023-11-16 MED ORDER — LOPERAMIDE HCL 2 MG PO CAPS
2.0000 mg | ORAL_CAPSULE | Freq: Four times a day (QID) | ORAL | 0 refills | Status: DC | PRN
  Filled 2023-11-16: qty 12, 3d supply, fill #0

## 2023-11-16 NOTE — ED Provider Notes (Signed)
 [place] EMERGENCY DEPARTMENT AT [facility] Provider Note   CSN: [id number] Arrival date & time: 11/16/23  1238     Patient presents with: Suicidal   David Wyatt is a 63 y.o. male.   63 yo M with a chief complaint of wanting help.  He tells me this about 6 times during my interaction.  He does not explain what health actually means.  He tells me that he has had nausea vomiting and diarrhea for about 12 hours.  He denies suspicious food or water intake but said some Asian people give him food and so maybe that is what the cause of it is.  No fevers.  I asked him specifically about suicidal ideations since that was a chief complaint mentioned in triage he tells me that he is not suicidal.  [place] he is just upset because nobody will help him.  When I ask him to explain what he needs help with he will not tell me.        Prior to Admission medications   Medication Sig Start Date End Date Taking? Authorizing Provider  loperamide  (IMODIUM ) 2 MG capsule Take 1 capsule (2 mg total) by mouth 4 (four) times daily as needed for diarrhea or loose stools. 11/16/23  Yes [name], DO  ondansetron  (ZOFRAN -ODT) 4 MG disintegrating tablet 4mg  ODT q4 hours prn nausea/vomit 11/16/23  Yes [name], DO  Accu-Chek Softclix Lancets lancets Use to check blood sugar three times daily as directed 11/01/23   Shrivastava, Aryendra, MD  amLODipine  (NORVASC ) 10 MG tablet Take 1 tablet (10 mg total) by mouth daily. Patient not taking: Reported on 11/03/2023 11/01/23   Shrivastava, Aryendra, MD  atorvastatin  (LIPITOR) 40 MG tablet Take 1 tablet (40 mg total) by mouth daily. Patient not taking: Reported on 11/03/2023 11/01/23   Shrivastava, Aryendra, MD  Blood Glucose Monitoring Suppl (ACCU-CHEK GUIDE ME) w/Device KIT Use to test blood sugar 3 times daily. 11/01/23   Shrivastava, Aryendra, MD  doxepin  (SINEQUAN ) 25 MG capsule Take 1 capsule (25 mg total) by mouth at bedtime. Patient not taking: Reported on  11/03/2023 11/01/23   Shrivastava, Aryendra, MD  DULoxetine  (CYMBALTA ) 60 MG capsule Take 1 capsule (60 mg total) by mouth daily. Patient not taking: Reported on 11/03/2023 11/01/23   Shrivastava, Aryendra, MD  gabapentin  (NEURONTIN ) 300 MG capsule Take 1 capsule (300 mg total) by mouth 3 (three) times daily. Patient not taking: Reported on 11/03/2023 11/01/23   Shrivastava, Aryendra, MD  glucose blood (ACCU-CHEK GUIDE TEST) test strip Use as instructed to check blood sugar three times daily 11/01/23   Shrivastava, Aryendra, MD  hydrOXYzine  (ATARAX ) 10 MG tablet Take 1 tablet (10 mg total) by mouth 3 (three) times daily as needed FOR ANXIETY. Patient not taking: Reported on 11/03/2023 11/01/23   Shrivastava, Aryendra, MD  insulin  aspart (NOVOLOG ) 100 UNIT/ML injection Inject 0-9 Units into the skin 3 (three) times daily with meals. Patient not taking: Reported on 11/03/2023 11/01/23   Shrivastava, Aryendra, MD  insulin  glargine (LANTUS  SOLOSTAR) 100 UNIT/ML Solostar Pen Inject 30 Units into the skin daily. Patient not taking: Reported on 11/03/2023 11/03/23   Newlin, Enobong, MD  lidocaine  (LIDODERM ) 5 % Place 1 patch onto the skin daily. Remove & Discard patch within 12 hours or as directed by MD Patient not taking: Reported on 11/03/2023 11/01/23   Shrivastava, Aryendra, MD  metFORMIN  (GLUCOPHAGE ) 500 MG tablet Take 1 tablet (500 mg total) by mouth 2 (two) times daily with a meal.  11/03/23   [name], PA  nicotine  (NICODERM CQ  - DOSED IN MG/24 HR) 7 mg/24hr patch Place 1 patch (7 mg total) onto the skin daily as needed (nicotine  withdrawal). Patient not taking: Reported on 11/03/2023 11/01/23   Shrivastava, Aryendra, MD    Allergies: Lisinopril     Review of Systems  Updated Vital Signs BP 115/62 (BP Location: Right Arm)   Pulse 91   Temp 98.2 F (36.8 C) (Oral)   Resp 16   SpO2 97%   Physical Exam Vitals and nursing note reviewed.  Constitutional:      Appearance: He is well-developed.   HENT:     Head: Normocephalic and atraumatic.  Eyes:     Pupils: Pupils are equal, round, and reactive to light.  Neck:     Vascular: No JVD.  Cardiovascular:     Rate and Rhythm: Normal rate and regular rhythm.     Heart sounds: No murmur heard.    No friction rub. No gallop.  Pulmonary:     Effort: No respiratory distress.     Breath sounds: No wheezing.  Abdominal:     General: There is no distension.     Tenderness: There is no abdominal tenderness. There is no guarding or rebound.  Musculoskeletal:        General: Normal range of motion.     Cervical back: Normal range of motion and neck supple.  Skin:    Coloration: Skin is not pale.     Findings: No rash.  Neurological:     Mental Status: He is alert and oriented to person, place, and time.  Psychiatric:        Behavior: Behavior normal.     (all labs ordered are listed, but only abnormal results are displayed) Labs Reviewed  I-STAT CHEM 8, ED - Abnormal; Notable for the following components:      Result Value   BUN 36 (*)    Creatinine, Ser 1.70 (*)    Glucose, Bld 172 (*)    Calcium , Ion 1.13 (*)    All other components within normal limits    EKG: None  Radiology: No results found.   Procedures   Medications Ordered in the ED  ondansetron  (ZOFRAN -ODT) disintegrating tablet 4 mg (4 mg Oral Given 11/16/23 1323)  sodium chloride  0.9 % bolus 1,000 mL (1,000 mLs Intravenous New Bag/Given 11/16/23 1433)                                    Medical Decision Making Risk Prescription drug management.   63 yo M with a chief complaints of wanting help.  The patient is unable to quantify this further.  He tells me he does not feel like explaining himself because he has mentioned that sometimes and no one will help him.  This is the patient's 30th visit in the past 6 months.  He was actually seen twice at behavioral health urgent care yesterday, was thought to be malingering and discharged home.  With  reported nausea vomiting and diarrhea will check i-STAT Chem-8 for significant electrolyte abnormalities.  He was able to tolerate by mouth here without issue.  Patient with an AKI.  Will give a bolus of IV fluids.  Patient asked me to take a look at his feet hands.  Had been describing burning pain to both.  Says he has a history of neuropathy and takes medication for  it.  Patient's feet are dirty but I do not see any obvious signs of infection.  No blistering.  Dry skin to the hands but I do not see any obvious lesions.  I encouraged him to continue taking his medication and to follow-up with his family doctor in the office.  3:31 PM:  I have discussed the diagnosis/risks/treatment options with the patient.  Evaluation and diagnostic testing in the emergency department does not suggest an emergent condition requiring admission or immediate intervention beyond what has been performed at this time.  They will follow up with PCP. We also discussed returning to the ED immediately if new or worsening sx occur. We discussed the sx which are most concerning (e.g., sudden worsening pain, fever, inability to tolerate by mouth) that necessitate immediate return. Medications administered to the patient during their visit and any new prescriptions provided to the patient are listed below.  Medications given during this visit Medications  ondansetron  (ZOFRAN -ODT) disintegrating tablet 4 mg (4 mg Oral Given 11/16/23 1323)  sodium chloride  0.9 % bolus 1,000 mL (1,000 mLs Intravenous New Bag/Given 11/16/23 1433)     The patient appears reasonably screen and/or stabilized for discharge and I doubt any other medical condition or other [facility] requiring further screening, evaluation, or treatment in the ED at this time prior to discharge.        Final diagnoses:  Nausea vomiting and diarrhea    ED Discharge Orders          Ordered    ondansetron  (ZOFRAN -ODT) 4 MG disintegrating tablet        11/16/23 1526     loperamide  (IMODIUM ) 2 MG capsule  4 times daily PRN        11/16/23 1526               [name], DO 11/16/23 1531

## 2023-11-16 NOTE — ED Triage Notes (Signed)
 Pt BIBA for suicidal ideation.  Pt is unhoused, states he has been on drugs for 50 years and can't stop,  wants to end his life, has a plan.

## 2023-11-16 NOTE — ED Notes (Signed)
Dr. Floyd at bedside. 

## 2023-11-16 NOTE — ED Notes (Signed)
 Patient left AMA.

## 2023-11-16 NOTE — ED Notes (Signed)
 Provided pt bus pass to go to [facility]

## 2023-11-16 NOTE — Discharge Instructions (Signed)
 Your labs showed that you are little bit dehydrated.  Try to increase your fluid intake at home.  I prescribed you medication for nausea.  Have also prescribed you medication for diarrhea.  Please follow-up with your family doctor in the office for recheck of your labs in a week or 2.

## 2023-11-17 ENCOUNTER — Encounter: Payer: Self-pay | Admitting: Family Medicine

## 2023-11-17 ENCOUNTER — Ambulatory Visit: Payer: MEDICAID | Attending: Family Medicine | Admitting: Family Medicine

## 2023-11-17 ENCOUNTER — Other Ambulatory Visit (HOSPITAL_COMMUNITY): Payer: Self-pay

## 2023-11-17 ENCOUNTER — Telehealth: Payer: Self-pay

## 2023-11-17 VITALS — BP 124/76 | HR 83 | Ht 68.0 in | Wt 153.4 lb

## 2023-11-17 DIAGNOSIS — Z794 Long term (current) use of insulin: Secondary | ICD-10-CM

## 2023-11-17 DIAGNOSIS — Z91148 Patient's other noncompliance with medication regimen for other reason: Secondary | ICD-10-CM | POA: Diagnosis not present

## 2023-11-17 DIAGNOSIS — R45851 Suicidal ideations: Secondary | ICD-10-CM

## 2023-11-17 DIAGNOSIS — Z7984 Long term (current) use of oral hypoglycemic drugs: Secondary | ICD-10-CM

## 2023-11-17 DIAGNOSIS — E1169 Type 2 diabetes mellitus with other specified complication: Secondary | ICD-10-CM | POA: Diagnosis not present

## 2023-11-17 DIAGNOSIS — Z5900 Homelessness unspecified: Secondary | ICD-10-CM

## 2023-11-17 NOTE — Telephone Encounter (Signed)
 I spoke to [employer] and [facility] Crisis Counselor, Tana Bamberger, when they arrived for the patient.    Per Mr Bamberger, the patient is known to their department.  He explained that they have been working with the patient to secure housing; but there is no easy solution for his housing needs.  Mr Bamberger reported that his co-worker, Bobetta, has been in contact with [employer] and is attempting to connect the patient with housing resources.  The patient has an appointment with Bobetta at [facility].  The patient acknowledged that the information Mr. Bamberger reported is true.    After speaking with the patient for awhile, Officer Rudy and Mr Bamberger left with the patient and planned to take him to the [facility].

## 2023-11-17 NOTE — Patient Instructions (Signed)
 Suicidal Feelings: How to Help Yourself Suicide is when you end your own life. Suicidal ideation includes expressing thoughts about, or a preoccupation with, ending your own life. There are many things you can do to help yourself feel better when struggling with these feelings. Many services and people are available to support you and others who struggle with similar feelings. If you ever feel like you may hurt yourself or others, or have thoughts about taking your own life, get help right away. To get help: Go to your nearest emergency room. Call 911. Call the [employer] health and human services helpline (211 in the [place] Call or text a suicide hotline to speak with a trained counselor. The following suicide hotlines are available in the [place]: The Suicide & Crisis Lifeline (free and confidential): Call (786)105-2207 or 988. Text (504)630-2655. 1-800-SUICIDE (765)353-7571). If you're a Veteran: Call 988 and press 1. Text the [facility] at (484) 775-2904. 601-773-2819. This is a hotline for Spanish speakers. (203)656-7651. This is a hotline for TTY users. 1-866-4-U-TREVOR (928) 225-5232). This is a hotline for lesbian, gay, bisexual, transgender, or questioning youth. For a list of hotlines in [place], visit AmenCredit.is.html Contact a crisis center or a local suicide prevention center. To find a crisis center or suicide prevention center: Call your local hospital, clinic, community service organization, mental health center, social service provider, or health department. Ask for help with connecting to a crisis center. For a list of crisis centers in the [place], visit: suicidepreventionlifeline.org For a list of crisis centers in [place], visit: suicideprevention.ca How to help yourself feel better  Promise yourself that you will not do anything bad or extreme when you have suicidal feelings. Remember the times you have felt  hopeful. Many people have gotten through suicidal thoughts and feelings, and you can too. If you have had these feelings before, remind yourself that you can get through them again. Let family, friends, teachers, or counselors know how you are feeling. Do not separate yourself from those who care about you and want to help you. Talk with someone every day, even if you do not feel like talking to anyone or being with other people. Face-to-face conversation is best to help them understand your feelings. Contact a mental health care provider and work with this person regularly. Make a safety plan that you can follow during a crisis. Include phone numbers of suicide prevention hotlines, mental health professionals, and trusted friends and family members you can call during an emergency. Save these numbers on your phone. If you are thinking of taking a lot of medicine, give your medicine to someone who can give it to you as prescribed. If you are on antidepressants and are concerned you will overdose, tell your health care provider so that he or she can give you safer medicines. Try to stick to your routines and follow a schedule every day. Make self-care a priority. Make a list of realistic goals, and cross them off when you achieve them. Accomplishments can give you a sense of worth. Wait until you are feeling better before doing things that you find difficult or unpleasant. Do things that you have always enjoyed to take your mind off your feelings. Try reading a book, or listening to or playing music. Spending time outside, in nature, may help you feel better. Follow these instructions at home:  Visit your primary health care provider every year for a physical and a mental health checkup. Take over-the-counter and prescription medicines only as  told by your health care provider. Ask your health care provider about the possible side effects of any medicines you are taking. Ask your health care  provider about whether suicidal ideation is a possible side effect of any of your medicines. Learn about suicidal ideation and what increases the risk for the development of suicidal thoughts. Eat a well-balanced diet, and eat regular meals. Get plenty of rest. Exercise if you are able. Just 30 minutes of exercise each day can help you feel better. Keep your living space well lit. Do not use alcohol or drugs. Remove these substances from your home. General recommendations Remove weapons, poisons, knives, and other deadly items from your home. Work with a mental health care provider as needed. When you are feeling well, write yourself a letter with tips and support that you can read when you are not feeling well. Remember that life's difficulties can be sorted out with help. Conditions can be treated, and you can learn behaviors and ways of thinking that will help you. Work with your health care provider or counselor to learn ways of coping with your thoughts and feelings. Where to find more information National Suicide Prevention Lifeline: www.suicidepreventionlifeline.org Hopeline: www.hopeline.com [employer] for Suicide Prevention: https://www.ayers.com/ [employer] (for lesbian, gay, bisexual, transgender, or questioning youth): www.thetrevorproject.[employer] of Mental Health: https://ramirez-williams.com/ Suicide Prevention Resources: FarmerBuys.com.au Contact a health care provider if: You feel as though you are a burden to others. You feel agitated, angry, vengeful, or have extreme mood swings. You have withdrawn from family and friends. You are frequently using drugs or alcohol. Get help right away if: You are talking about suicide or wishing to die. You start making plans for how to commit suicide. You feel that you have no reason to live. You start making plans for putting your affairs in order, saying goodbye, or giving  your possessions away. You feel guilt, shame, or unbearable pain, and it seems like there is no way out. You are engaging in risky behaviors that could lead to death. If you have any of these thoughts or symptoms, get help right away: Go to your nearest emergency department or crisis center. Call emergency services (911 in the [place] Call or text a suicide crisis helpline. Summary Suicide is when you take your own life. Suicidal feelings are thoughts about ending your own life. Promise yourself that you will not do anything bad or extreme when you have suicidal feelings. Let family, friends, teachers, or counselors know how you are feeling. Get help right away if you start making plans for how to commit suicide. This information is not intended to replace advice given to you by your health care provider. Make sure you discuss any questions you have with your health care provider. Document Revised: 12/17/2022 Document Reviewed: 07/06/2020 Elsevier Patient Education  2024 [employer].
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
## 2023-11-17 NOTE — Telephone Encounter (Signed)
 At the request of Dr Delbert, I called 911 due to patient's suicidal intent.

## 2023-11-17 NOTE — Telephone Encounter (Signed)
 Patient came to the clinic and explained he has gone to the ED multiple times this weekend. We gave him an appointment to see Dr Newlin this morning. I tried to reach his tailored care manager with Loralyn Maus Martin/ [employer] : [phone] and her voicemail was full.

## 2023-11-18 ENCOUNTER — Encounter (HOSPITAL_COMMUNITY): Payer: Self-pay

## 2023-11-18 ENCOUNTER — Emergency Department (HOSPITAL_COMMUNITY)
Admission: EM | Admit: 2023-11-18 | Discharge: 2023-11-18 | Disposition: A | Discharge status: Home / Self Care | Payer: MEDICAID | Attending: Emergency Medicine | Admitting: Emergency Medicine

## 2023-11-18 ENCOUNTER — Other Ambulatory Visit: Payer: Self-pay

## 2023-11-18 DIAGNOSIS — S0990XA Unspecified injury of head, initial encounter: Secondary | ICD-10-CM | POA: Diagnosis not present

## 2023-11-18 DIAGNOSIS — I10 Essential (primary) hypertension: Secondary | ICD-10-CM | POA: Insufficient documentation

## 2023-11-18 DIAGNOSIS — R519 Headache, unspecified: Secondary | ICD-10-CM | POA: Diagnosis present

## 2023-11-18 DIAGNOSIS — F1410 Cocaine abuse, uncomplicated: Secondary | ICD-10-CM | POA: Insufficient documentation

## 2023-11-18 DIAGNOSIS — Z794 Long term (current) use of insulin: Secondary | ICD-10-CM | POA: Insufficient documentation

## 2023-11-18 DIAGNOSIS — E119 Type 2 diabetes mellitus without complications: Secondary | ICD-10-CM | POA: Diagnosis not present

## 2023-11-18 DIAGNOSIS — Z7984 Long term (current) use of oral hypoglycemic drugs: Secondary | ICD-10-CM | POA: Diagnosis not present

## 2023-11-18 DIAGNOSIS — Z59819 Housing instability, housed unspecified: Secondary | ICD-10-CM | POA: Diagnosis not present

## 2023-11-18 DIAGNOSIS — Z79899 Other long term (current) drug therapy: Secondary | ICD-10-CM | POA: Insufficient documentation

## 2023-11-18 MED ORDER — NAPROXEN 250 MG PO TABS
500.0000 mg | ORAL_TABLET | Freq: Once | ORAL | Status: AC
  Administered 2023-11-18: 500 mg via ORAL
  Filled 2023-11-18: qty 2
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
## 2023-11-18 NOTE — Discharge Instructions (Signed)
Take tylenol or ibuprofen for pain. Follow up with a primary care doctor. °

## 2023-11-19 ENCOUNTER — Telehealth: Payer: Self-pay | Admitting: Family Medicine

## 2023-11-19 ENCOUNTER — Ambulatory Visit: Payer: MEDICAID | Admitting: Physician Assistant

## 2023-11-19 ENCOUNTER — Encounter: Payer: Self-pay | Admitting: Physician Assistant

## 2023-11-19 VITALS — BP 142/80 | HR 82 | Ht 70.0 in | Wt 156.0 lb

## 2023-11-19 DIAGNOSIS — E1165 Type 2 diabetes mellitus with hyperglycemia: Secondary | ICD-10-CM

## 2023-11-19 DIAGNOSIS — I10 Essential (primary) hypertension: Secondary | ICD-10-CM

## 2023-11-19 DIAGNOSIS — Z794 Long term (current) use of insulin: Secondary | ICD-10-CM | POA: Diagnosis not present

## 2023-11-19 DIAGNOSIS — F332 Major depressive disorder, recurrent severe without psychotic features: Secondary | ICD-10-CM | POA: Diagnosis not present

## 2023-11-19 DIAGNOSIS — Z5900 Homelessness unspecified: Secondary | ICD-10-CM

## 2023-11-19 LAB — GLUCOSE, POCT (MANUAL RESULT ENTRY): POC Glucose: 398 mg/dL — AB (ref 70–99)

## 2023-11-19 LAB — AMB RESULTS CONSOLE CBG: Glucose: 249

## 2023-11-19 MED ORDER — INSULIN ASPART 100 UNIT/ML IJ SOLN
10.0000 [IU] | Freq: Once | INTRAMUSCULAR | Status: AC
  Administered 2023-11-19: 10 [IU] via SUBCUTANEOUS

## 2023-11-19 MED ORDER — INSULIN NPH (HUMAN) (ISOPHANE) 100 UNIT/ML ~~LOC~~ SUSP: 10.0000 [IU] | Freq: Once | SUBCUTANEOUS | Status: DC

## 2023-11-19 NOTE — Patient Instructions (Addendum)
 VISIT SUMMARY:  Today, we discussed your elevated blood sugar levels and fatigue. We also talked about your current living situation and the impact it has on your health. Additionally, we addressed your feelings of frustration and ongoing thoughts of self-harm.  YOUR PLAN:  -SUICIDAL IDEATION: You have ongoing thoughts of self-harm, which are linked to your frustration with your current situation and lack of support. We discussed options for finding a stable environment to help with your mental health. Please continue to reach out for support when needed.  -HOMELESSNESS: Your current lack of stable housing is contributing to your fatigue and difficulty sleeping. Please continue to follow up with [facility] and other resources for support and assistance.  -FATIGUE: Your fatigue is likely due to not having a safe place to sleep. Your thyroid  function was checked three weeks ago and is normal.  -TYPE 2 DIABETES MELLITUS: Your blood sugar levels are elevated, with a current reading of 269 mg/dL after eating. You did not take your insulin  this morning but received it at the hospital yesterday.   Suicidal Feelings: How to Help Yourself Suicide is when you end your own life. Suicidal ideation includes expressing thoughts about, or a preoccupation with, ending your own life. There are many things you can do to help yourself feel better when struggling with these feelings. Many services and people are available to support you and others who struggle with similar feelings. If you ever feel like you may hurt yourself or others, or have thoughts about taking your own life, get help right away. To get help: Go to your nearest emergency room. Call 911. Call the [employer] health and human services helpline (211 in the [place] Call or text a suicide hotline to speak with a trained counselor. The following suicide hotlines are available in the [place] : The Suicide & Crisis Lifeline (free and  confidential): Call 919-316-4176 or 988. Text (437) 268-2603. 1-800-SUICIDE 213-684-9880). If you're a Veteran: Call 988 and press 1. Text the [facility] at 302 418 7586. 503-096-7318. This is a hotline for Spanish speakers. 9717950119. This is a hotline for TTY users. 1-866-4-U-TREVOR 610 009 3063). This is a hotline for lesbian, gay, bisexual, transgender, or questioning youth. For a list of hotlines in [place], visit AmenCredit.is.html Contact a crisis center or a local suicide prevention center. To find a crisis center or suicide prevention center: Call your local hospital, clinic, community service organization, mental health center, social service provider, or health department. Ask for help with connecting to a crisis center. For a list of crisis centers in the [place] , visit: suicidepreventionlifeline.org For a list of crisis centers in [place], visit: suicideprevention.ca How to help yourself feel better  Promise yourself that you will not do anything bad or extreme when you have suicidal feelings. Remember the times you have felt hopeful. Many people have gotten through suicidal thoughts and feelings, and you can too. If you have had these feelings before, remind yourself that you can get through them again. Let family, friends, teachers, or counselors know how you are feeling. Do not separate yourself from those who care about you and want to help you. Talk with someone every day, even if you do not feel like talking to anyone or being with other people. Face-to-face conversation is best to help them understand your feelings. Contact a mental health care provider and work with this person regularly. Make a safety plan that you can follow during a crisis. Include phone numbers of suicide prevention hotlines, mental health professionals,  and trusted friends and family members you can call during an emergency. Save these  numbers on your phone. If you are thinking of taking a lot of medicine, give your medicine to someone who can give it to you as prescribed. If you are on antidepressants and are concerned you will overdose, tell your health care provider so that he or she can give you safer medicines. Try to stick to your routines and follow a schedule every day. Make self-care a priority. Make a list of realistic goals, and cross them off when you achieve them. Accomplishments can give you a sense of worth. Wait until you are feeling better before doing things that you find difficult or unpleasant. Do things that you have always enjoyed to take your mind off your feelings. Try reading a book, or listening to or playing music. Spending time outside, in nature, may help you feel better. Follow these instructions at home:  Visit your primary health care provider every year for a physical and a mental health checkup. Take over-the-counter and prescription medicines only as told by your health care provider. Ask your health care provider about the possible side effects of any medicines you are taking. Ask your health care provider about whether suicidal ideation is a possible side effect of any of your medicines. Learn about suicidal ideation and what increases the risk for the development of suicidal thoughts. Eat a well-balanced diet, and eat regular meals. Get plenty of rest. Exercise if you are able. Just 30 minutes of exercise each day can help you feel better. Keep your living space well lit. Do not use alcohol or drugs. Remove these substances from your home. General recommendations Remove weapons, poisons, knives, and other deadly items from your home. Work with a mental health care provider as needed. When you are feeling well, write yourself a letter with tips and support that you can read when you are not feeling well. Remember that life's difficulties can be sorted out with help. Conditions can be  treated, and you can learn behaviors and ways of thinking that will help you. Work with your health care provider or counselor to learn ways of coping with your thoughts and feelings. Where to find more information National Suicide Prevention Lifeline: www.suicidepreventionlifeline.org Hopeline: www.hopeline.com [employer] for Suicide Prevention: https://www.ayers.com/ [employer] (for lesbian, gay, bisexual, transgender, or questioning youth): www.thetrevorproject.[employer] of Mental Health: https://ramirez-williams.com/ Suicide Prevention Resources: FarmerBuys.com.au Contact a health care provider if: You feel as though you are a burden to others. You feel agitated, angry, vengeful, or have extreme mood swings. You have withdrawn from family and friends. You are frequently using drugs or alcohol. Get help right away if: You are talking about suicide or wishing to die. You start making plans for how to commit suicide. You feel that you have no reason to live. You start making plans for putting your affairs in order, saying goodbye, or giving your possessions away. You feel guilt, shame, or unbearable pain, and it seems like there is no way out. You are engaging in risky behaviors that could lead to death. If you have any of these thoughts or symptoms, get help right away: Go to your nearest emergency department or crisis center. Call emergency services (911 in the [place] Call or text a suicide crisis helpline. Summary Suicide is when you take your own life. Suicidal feelings are thoughts about ending your own life. Promise yourself that you will not do anything bad or extreme when  you have suicidal feelings. Let family, friends, teachers, or counselors know how you are feeling. Get help right away if you start making plans for how to commit suicide. This information is not intended to replace advice given to you by your health  care provider. Make sure you discuss any questions you have with your health care provider. Document Revised: 12/17/2022 Document Reviewed: 07/06/2020 Elsevier Patient Education  2024 [employer].
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
## 2023-11-19 NOTE — Telephone Encounter (Signed)
 Copied from CRM [id number]. Topic: General - Other >> Nov 19, 2023 10:06 AM Jasmin G wrote: Reason for CRM: Pt's sister called regarding recent missed phone call, pt's sister states that someone tried to reach out to help him find a place to stay, I could not find any info to relay, please call pt's sister back at [phone] to discuss.

## 2023-11-19 NOTE — Telephone Encounter (Signed)
 Call returned to patient's sister, Madelin Perfect.  She said she was returning a call to this clinic because she received a message stating that someone found a place for him to live. Calton Daring, RN left that message for his sister.  She joined the call and explained that she has a list of a few places that may be available with rent between $575-800/month.  [name] did not know her brother's monthly income.  She said he called her 2 days ago and again today and next time he calls her she will have him call this clinic for the contact information those properties.  We explained to her that it is just a list of properties to call, there is no guarantee that they have any available properties and he will need to call the places himself.

## 2023-11-19 NOTE — Progress Notes (Unsigned)
 Established Patient Office Visit  Subjective   Patient ID: David Wyatt, male    DOB: [birth year]  Age: 63 y.o. MRN: [id number]  Chief Complaint  Patient presents with   Hyperglycemia    Patient presents today with elevated Glucose screening from [facility].  Patient states he feels tired. Denies any pain.   He also states he did not take insulin  this morning, but did retrieve medications from pharmacy last week.    Discussed the use of AI scribe software for clinical note transcription with the patient, who gave verbal consent to proceed.  History of Present Illness   David Wyatt is a 63 year old male with diabetes who presents with elevated blood sugar and fatigue.  He has elevated blood sugar levels, with a current reading of 269 mg/dL postprandially. He did not take insulin  this morning but received it at the hospital yesterday. His medications were obtained from the pharmacy last week. He visited a screening fleeta today where his blood sugar was elevated.  He experiences fatigue and difficulty achieving restful sleep, which he attributes to homelessness and the lack of a safe sleeping environment. He feels frustrated and desires a stable living situation. He acknowledges ongoing thoughts of self-harm and is in contact with [facility] for support.  He was in the emergency room five days ago, where labs were performed. His thyroid  function was checked three weeks ago.   Past Medical History:  Diagnosis Date   Angioedema of lips 07/28/2012   left upper (07/29/2012)   Arthritis    hands and back   Chronic back pain    Cocaine abuse (HCC)    Diabetic neuropathy (HCC)    High cholesterol    Hypertension    Neuropathy    Type II diabetes mellitus (HCC)    Social History   Socioeconomic History   Marital status: Divorced    Spouse name: Not on file   Number of children: 3   Years of education: 12   Highest education level: Not on file  Occupational History    Comment: disabled   Tobacco Use   Smoking status: Every Day    Current packs/day: 0.25    Average packs/day: 0.3 packs/day for 30.0 years (7.5 ttl pk-yrs)    Types: Cigarettes   Smokeless tobacco: Former    Quit date: 08/15/2015   Tobacco comments:    04/29/16 2  cigs daily, 10/27/17 sometimes < .25 PPD  Vaping Use   Vaping status: Never Used  Substance and Sexual Activity   Alcohol use: No    Alcohol/week: 0.0 standard drinks of alcohol   Drug use: Yes    Types: Crack cocaine   Sexual activity: Not on file  Other Topics Concern   Not on file  Social History Narrative   04/29/17   Lives in shelter   Caffeine- a lot of  tea, coffee   Social Drivers of Corporate investment banker Strain: Not on file  Food Insecurity: Food Insecurity Present (11/19/2023)   Hunger Vital Sign    Worried About Running Out of Food in the Last Year: Often true    Ran Out of Food in the Last Year: Often true  Transportation Needs: Unmet Transportation Needs (11/19/2023)   PRAPARE - Administrator, Civil Service (Medical): Yes    Lack of Transportation (Non-Medical): Yes  Physical Activity: Not on file  Stress: Not on file  Social Connections: Unknown (10/24/2023)   Social Connection and Isolation Panel  Frequency of Communication with Friends and Family: Once a week    Frequency of Social Gatherings with Friends and Family: Never    Attends Religious Services: Never    Database administrator or Organizations: No    Attends Banker Meetings: Never    Marital Status: Patient unable to answer  Intimate Partner Violence: Not At Risk (11/19/2023)   Humiliation, Afraid, Rape, and Kick questionnaire    Fear of Current or Ex-Partner: No    Emotionally Abused: No    Physically Abused: No    Sexually Abused: No   Family History  Problem Relation Age of Onset   Diabetes Mother    Kidney disease Mother    Hyperlipidemia Mother    Hyperlipidemia Father    Diabetes Father    Allergies  Allergen  Reactions   Lisinopril  Anaphylaxis, Swelling and Other (See Comments)    Lip Swelling and angioedema, too     Review of Systems  Constitutional:  Positive for malaise/fatigue.  HENT: Negative.    Eyes: Negative.   Respiratory:  Negative for shortness of breath.   Cardiovascular:  Negative for chest pain.  Gastrointestinal: Negative.   Genitourinary: Negative.   Musculoskeletal: Negative.   Skin: Negative.   Neurological: Negative.   Endo/Heme/Allergies: Negative.   Psychiatric/Behavioral:  Positive for depression. The patient is nervous/anxious and has insomnia.       Objective:     BP (!) 142/80 (BP Location: Left Arm, Patient Position: Sitting, Cuff Size: Large)   Pulse 82   Ht 5' 10 (1.778 m)   Wt 156 lb (70.8 kg)   SpO2 97%   BMI 22.38 kg/m  BP Readings from Last 3 Encounters:  11/19/23 (!) 142/80  11/19/23 (!) 154/90  11/18/23 (!) 132/93   Wt Readings from Last 3 Encounters:  11/19/23 156 lb (70.8 kg)  11/18/23 155 lb (70.3 kg)  11/17/23 153 lb 6.4 oz (69.6 kg)    Physical Exam Vitals and nursing note reviewed.  Constitutional:      General: He is not in acute distress.    GENERAL: Alert, cooperative, well developed, no acute distress HEENT: Normocephalic, normal oropharynx, moist mucous membranes CHEST: Clear to auscultation bilaterally, no wheezes, rhonchi, or crackles CARDIOVASCULAR: Normal heart rate and rhythm, S1 and S2 normal without murmurs EXTREMITIES: No cyanosis or edema NEUROLOGICAL: Cranial nerves grossly intact, moves all extremities without gross motor or sensory deficit    Assessment & Plan:   Problem List Items Addressed This Visit       Cardiovascular and Mediastinum   Essential hypertension, benign     Endocrine   Uncontrolled type 2 diabetes mellitus with hyperglycemia (HCC)   Relevant Orders   POCT glucose (manual entry) (Completed)   Hyperglycemia due to diabetes mellitus (HCC) - Primary     Other   Homelessness  1.  Hyperglycemia due to diabetes mellitus (HCC) (Primary) Recheck of blood glucose level is 398, patient given 10 units of NovoLog .  Patient strongly encouraged to be compliant to medication regimen.  Patient states he does not have his medications available to him.  Red flags given for prompt reevaluation. - insulin  aspart (novoLOG ) injection 10 Units  2. Uncontrolled type 2 diabetes mellitus with hyperglycemia (HCC)  - POCT glucose (manual entry)  3. Essential hypertension, benign   4. Homelessness Per chart and per patient he is currently working with social services to help with stable housing.  5. MDD (major depressive disorder), recurrent severe, without psychosis (HCC)  Patient adamantly denies any thoughts of self-harm at this time patient strongly encouraged to present to behavioral health urgent care or the emergency room if needed.  Patient understands and agrees    I have reviewed the patient's medical history (PMH, PSH, Social History, Family History, Medications, and allergies) , and have been updated if relevant. I spent 30 minutes reviewing chart and  face to face time with patient.    Return if symptoms worsen or fail to improve.    Kirk RAMAN Mayers, PA-C

## 2023-11-20 ENCOUNTER — Other Ambulatory Visit: Payer: Self-pay

## 2023-11-20 ENCOUNTER — Emergency Department (HOSPITAL_COMMUNITY)
Admission: EM | Admit: 2023-11-20 | Discharge: 2023-11-20 | Disposition: A | Discharge status: Home / Self Care | Payer: MEDICAID | Attending: Emergency Medicine | Admitting: Emergency Medicine

## 2023-11-20 ENCOUNTER — Encounter (HOSPITAL_COMMUNITY): Payer: Self-pay

## 2023-11-20 DIAGNOSIS — I10 Essential (primary) hypertension: Secondary | ICD-10-CM | POA: Insufficient documentation

## 2023-11-20 DIAGNOSIS — Z794 Long term (current) use of insulin: Secondary | ICD-10-CM | POA: Insufficient documentation

## 2023-11-20 DIAGNOSIS — Z7984 Long term (current) use of oral hypoglycemic drugs: Secondary | ICD-10-CM | POA: Diagnosis not present

## 2023-11-20 DIAGNOSIS — E1140 Type 2 diabetes mellitus with diabetic neuropathy, unspecified: Secondary | ICD-10-CM | POA: Insufficient documentation

## 2023-11-20 DIAGNOSIS — E1165 Type 2 diabetes mellitus with hyperglycemia: Secondary | ICD-10-CM | POA: Diagnosis not present

## 2023-11-20 DIAGNOSIS — R739 Hyperglycemia, unspecified: Secondary | ICD-10-CM | POA: Diagnosis present

## 2023-11-20 DIAGNOSIS — Z79899 Other long term (current) drug therapy: Secondary | ICD-10-CM | POA: Diagnosis not present

## 2023-11-20 DIAGNOSIS — R5383 Other fatigue: Secondary | ICD-10-CM

## 2023-11-20 LAB — I-STAT CHEM 8, ED
BUN: 9 mg/dL (ref 8–23)
Calcium, Ion: 1.11 mmol/L — ABNORMAL LOW (ref 1.15–1.40)
Chloride: 105 mmol/L (ref 98–111)
Creatinine, Ser: 1 mg/dL (ref 0.61–1.24)
Glucose, Bld: 481 mg/dL — ABNORMAL HIGH (ref 70–99)
HCT: 38 % — ABNORMAL LOW (ref 39.0–52.0)
Hemoglobin: 12.9 g/dL — ABNORMAL LOW (ref 13.0–17.0)
Potassium: 3.9 mmol/L (ref 3.5–5.1)
Sodium: 139 mmol/L (ref 135–145)
TCO2: 22 mmol/L (ref 22–32)

## 2023-11-20 LAB — CBC WITH DIFFERENTIAL/PLATELET
Abs Immature Granulocytes: 0.05 K/uL (ref 0.00–0.07)
Basophils Absolute: 0 K/uL (ref 0.0–0.1)
Basophils Relative: 0 %
Eosinophils Absolute: 0.3 K/uL (ref 0.0–0.5)
Eosinophils Relative: 5 %
HCT: 38 % — ABNORMAL LOW (ref 39.0–52.0)
Hemoglobin: 12.1 g/dL — ABNORMAL LOW (ref 13.0–17.0)
Immature Granulocytes: 1 %
Lymphocytes Relative: 39 %
Lymphs Abs: 2.3 K/uL (ref 0.7–4.0)
MCH: 31.4 pg (ref 26.0–34.0)
MCHC: 31.8 g/dL (ref 30.0–36.0)
MCV: 98.7 fL (ref 80.0–100.0)
Monocytes Absolute: 0.5 K/uL (ref 0.1–1.0)
Monocytes Relative: 8 %
Neutro Abs: 2.8 K/uL (ref 1.7–7.7)
Neutrophils Relative %: 47 %
Platelets: 318 K/uL (ref 150–400)
RBC: 3.85 MIL/uL — ABNORMAL LOW (ref 4.22–5.81)
RDW: 13.1 % (ref 11.5–15.5)
WBC: 5.9 K/uL (ref 4.0–10.5)
nRBC: 0 % (ref 0.0–0.2)

## 2023-11-20 LAB — URINALYSIS, ROUTINE W REFLEX MICROSCOPIC
Bacteria, UA: NONE SEEN
Bilirubin Urine: NEGATIVE
Glucose, UA: >=500 mg/dL — AB
Hgb urine dipstick: NEGATIVE
Ketones, ur: NEGATIVE mg/dL
Leukocytes,Ua: NEGATIVE
Nitrite: NEGATIVE
Protein, ur: NEGATIVE mg/dL
Specific Gravity, Urine: 1.025 (ref 1.005–1.030)
pH: 6 (ref 5.0–8.0)

## 2023-11-20 LAB — COMPREHENSIVE METABOLIC PANEL WITH GFR
ALT: 17 U/L (ref 0–44)
AST: 18 U/L (ref 15–41)
Albumin: 2.9 g/dL — ABNORMAL LOW (ref 3.5–5.0)
Alkaline Phosphatase: 85 U/L (ref 38–126)
Anion gap: 14 (ref 5–15)
BUN: 18 mg/dL (ref 8–23)
CO2: 21 mmol/L — ABNORMAL LOW (ref 22–32)
Calcium: 8.5 mg/dL — ABNORMAL LOW (ref 8.9–10.3)
Chloride: 102 mmol/L (ref 98–111)
Creatinine, Ser: 1.09 mg/dL (ref 0.61–1.24)
GFR, Estimated: >60 mL/min (ref >60)
Glucose, Bld: 465 mg/dL — ABNORMAL HIGH (ref 70–99)
Potassium: 3.9 mmol/L (ref 3.5–5.1)
Sodium: 137 mmol/L (ref 135–145)
Total Bilirubin: 0.6 mg/dL (ref 0.0–1.2)
Total Protein: 6.1 g/dL — ABNORMAL LOW (ref 6.5–8.1)

## 2023-11-20 LAB — CBG MONITORING, ED
Glucose-Capillary: 417 mg/dL — ABNORMAL HIGH (ref 70–99)
Glucose-Capillary: 187 mg/dL — ABNORMAL HIGH (ref 70–99)

## 2023-11-20 LAB — BETA-HYDROXYBUTYRIC ACID: Beta-Hydroxybutyric Acid: 0.11 mmol/L (ref 0.05–0.27)

## 2023-11-20 MED ORDER — INSULIN ASPART 100 UNIT/ML IJ SOLN
10.0000 [IU] | Freq: Once | INTRAMUSCULAR | Status: AC
  Administered 2023-11-20: 10 [IU] via SUBCUTANEOUS

## 2023-11-20 MED ORDER — LANTUS SOLOSTAR 100 UNIT/ML ~~LOC~~ SOPN
30.0000 [IU] | PEN_INJECTOR | Freq: Every day | SUBCUTANEOUS | 2 refills | Status: DC
  Filled 2023-11-20: qty 15, 50d supply, fill #0

## 2023-11-20 MED ORDER — LACTATED RINGERS IV BOLUS
1000.0000 mL | Freq: Once | INTRAVENOUS | Status: AC
  Administered 2023-11-20: 1000 mL via INTRAVENOUS

## 2023-11-20 MED ORDER — INSULIN ASPART 100 UNIT/ML IJ SOLN
0.0000 [IU] | Freq: Three times a day (TID) | INTRAMUSCULAR | 3 refills | Status: DC
  Filled 2023-11-20: qty 10, 37d supply, fill #0

## 2023-11-20 MED ORDER — POTASSIUM CHLORIDE CRYS ER 20 MEQ PO TBCR
40.0000 meq | EXTENDED_RELEASE_TABLET | Freq: Once | ORAL | Status: AC
  Administered 2023-11-20: 40 meq via ORAL
  Filled 2023-11-20: qty 2

## 2023-11-20 NOTE — Discharge Instructions (Addendum)
 Thank you for coming to [facility] Emergency Department. You were seen for fatigue. We did an exam, labs, and these showed high blood sugar. Please take your insulin  as prescribed. We updated your insulin  prescriptions. Please follow up with your doctor about your diabetes.   Do not hesitate to return to the ED or call 911 if you experience: -Worsening symptoms -Lightheadedness, passing out -Fevers/chills -Anything else that concerns you

## 2023-11-20 NOTE — ED Provider Notes (Signed)
 [place] EMERGENCY DEPARTMENT AT [facility] Provider Note   CSN: [id number] Arrival date & time: 11/20/23  1538     History  Chief Complaint  Patient presents with   Hyperglycemia   Fatigue    David Wyatt is a 64 y.o. male with PMH as listed below who presents BIB EMS from the street.  Reports malaise and non compliance with meds bc he claims his insulin  got hot in a car.  He states he has not had his insulin  in 2 days.  He denies any pain anywhere.  Denies any nausea vomiting, abdominal pain, extremity pain, falls, additional head trauma.   CBG 497   HR 90 BP 182/90.   Patient is a high ED utilizer.  Seen in the ED on 11/18/2023 with report of head injury and several other complaints with multiple other Ed visits in the last several weeks.  Last admitted 10/24/2023 to 11/01/2023 for depression and suicidal thoughts.  Smokes cigarettes and uses crack cocaine.  He is homeless and unemployed.  Past Medical History:  Diagnosis Date   Angioedema of lips 07/28/2012   left upper (07/29/2012)   Arthritis    hands and back   Chronic back pain    Cocaine abuse (HCC)    Diabetic neuropathy (HCC)    High cholesterol    Hypertension    Neuropathy    Type II diabetes mellitus (HCC)        Home Medications Prior to Admission medications   Medication Sig Start Date End Date Taking? Authorizing Provider  Accu-Chek Softclix Lancets lancets Use to check blood sugar three times daily as directed 11/01/23   Shrivastava, Aryendra, MD  amLODipine  (NORVASC ) 10 MG tablet Take 1 tablet (10 mg total) by mouth daily. 11/01/23   Shrivastava, Aryendra, MD  atorvastatin  (LIPITOR) 40 MG tablet Take 1 tablet (40 mg total) by mouth daily. 11/01/23   Shrivastava, Aryendra, MD  Blood Glucose Monitoring Suppl (ACCU-CHEK GUIDE ME) w/Device KIT Use to test blood sugar 3 times daily. 11/01/23   Shrivastava, Aryendra, MD  doxepin  (SINEQUAN ) 25 MG capsule Take 1 capsule (25 mg total) by mouth at bedtime.  11/01/23   Shrivastava, Aryendra, MD  DULoxetine  (CYMBALTA ) 60 MG capsule Take 1 capsule (60 mg total) by mouth daily. 11/01/23   Shrivastava, Aryendra, MD  gabapentin  (NEURONTIN ) 300 MG capsule Take 1 capsule (300 mg total) by mouth 3 (three) times daily. 11/01/23   Shrivastava, Aryendra, MD  glucose blood (ACCU-CHEK GUIDE TEST) test strip Use as instructed to check blood sugar three times daily 11/01/23   Shrivastava, Aryendra, MD  hydrOXYzine  (ATARAX ) 10 MG tablet Take 1 tablet (10 mg total) by mouth 3 (three) times daily as needed FOR ANXIETY. 11/01/23   Hellen Becket, MD  insulin  aspart (NOVOLOG ) 100 UNIT/ML injection Inject 0-9 Units into the skin 3 (three) times daily with meals. 11/20/23   Franklyn Sid SAILOR, MD  insulin  glargine (LANTUS  SOLOSTAR) 100 UNIT/ML Solostar Pen Inject 30 Units into the skin daily. 11/20/23   Franklyn Sid SAILOR, MD  lidocaine  (LIDODERM ) 5 % Place 1 patch onto the skin daily. Remove & Discard patch within 12 hours or as directed by MD Patient not taking: Reported on 11/03/2023 11/01/23   Shrivastava, Aryendra, MD  loperamide  (IMODIUM ) 2 MG capsule Take 1 capsule (2 mg total) by mouth 4 (four) times daily as needed for diarrhea or loose stools. 11/16/23   [name], DO  metFORMIN  (GLUCOPHAGE ) 500 MG tablet Take 1 tablet (500  mg total) by mouth 2 (two) times daily with a meal. 11/03/23   [name], PA  nicotine  (NICODERM CQ  - DOSED IN MG/24 HR) 7 mg/24hr patch Place 1 patch (7 mg total) onto the skin daily as needed (nicotine  withdrawal). Patient not taking: Reported on 11/03/2023 11/01/23   Shrivastava, Aryendra, MD  ondansetron  (ZOFRAN -ODT) 4 MG disintegrating tablet Dissolve one tablet in mouth every 4 hours as needed for nausea/vomiting. 11/16/23   Floyd, Dan, DO      Allergies    Lisinopril     Review of Systems   Review of Systems A 10 point review of systems was performed and is negative unless otherwise reported in HPI.  Physical Exam Updated Vital Signs BP  139/85 (BP Location: Left Arm)   Pulse 69   Temp 98.6 F (37 C) (Oral)   Resp 16   Ht 5' 10 (1.778 m)   Wt 70.8 kg   SpO2 100%   BMI 22.38 kg/m  Physical Exam General: Normal appearing male, lying in bed.  Disheveled HEENT: NCAT, PERRLA, Sclera anicteric, MMM, trachea midline.  Edentulous Cardiology: RRR, no murmurs/rubs/gallops.  Resp: Normal respiratory rate and effort. CTAB, no wheezes, rhonchi, crackles.  Abd: Soft, non-tender, non-distended. No rebound tenderness or guarding.  GU: Deferred. MSK: No peripheral edema or signs of trauma. Extremities without deformity or TTP. No cyanosis or clubbing. Skin: warm, dry. Neuro: A&Ox4, CNs II-XII grossly intact. MAEs. Sensation grossly intact.  Psych: Normal mood and affect.   ED Results / Procedures / Treatments   Labs (all labs ordered are listed, but only abnormal results are displayed) Labs Reviewed  CBC WITH DIFFERENTIAL/PLATELET - Abnormal; Notable for the following components:      Result Value   RBC 3.85 (*)    Hemoglobin 12.1 (*)    HCT 38.0 (*)    All other components within normal limits  COMPREHENSIVE METABOLIC PANEL WITH GFR - Abnormal; Notable for the following components:   CO2 21 (*)    Glucose, Bld 465 (*)    Calcium  8.5 (*)    Total Protein 6.1 (*)    Albumin 2.9 (*)    All other components within normal limits  URINALYSIS, ROUTINE W REFLEX MICROSCOPIC - Abnormal; Notable for the following components:   Color, Urine STRAW (*)    Glucose, UA >=500 (*)    All other components within normal limits  CBG MONITORING, ED - Abnormal; Notable for the following components:   Glucose-Capillary 417 (*)    All other components within normal limits  I-STAT CHEM 8, ED - Abnormal; Notable for the following components:   Glucose, Bld 481 (*)    Calcium , Ion 1.11 (*)    Hemoglobin 12.9 (*)    HCT 38.0 (*)    All other components within normal limits  CBG MONITORING, ED - Abnormal; Notable for the following components:    Glucose-Capillary 187 (*)    All other components within normal limits  BETA-HYDROXYBUTYRIC ACID    EKG None  Radiology No results found.  Procedures Procedures    Medications Ordered in ED Medications  lactated ringers  bolus 1,000 mL (0 mLs Intravenous Stopped 11/20/23 1842)  potassium chloride  SA (KLOR-CON  M) CR tablet 40 mEq (40 mEq Oral Given 11/20/23 1720)  insulin  aspart (novoLOG ) injection 10 Units (10 Units Subcutaneous Given 11/20/23 1721)    ED Course/ Medical Decision Making/ A&P  Medical Decision Making Amount and/or Complexity of Data Reviewed Labs: ordered. Decision-making details documented in ED Course.  Risk Prescription drug management.    This patient presents to the ED for concern of hyperglycemia malaise fatigue, this involves an extensive number of treatment options, and is a complaint that carries with it a high risk of complications and morbidity.    MDM:    Patient with fatigue malaise and hyperglycemia.  He is intermittently cooperative with exam and I have high degree of suspicion for malingering however he is hyperglycemic into the 400s.  His labs are reassuring against DKA and he is very well-appearing.  He is given 10 units of NovoLog  as well as some IV fluid approximately 500 cc and his glucose improved to 187.  He reports he is hungry and is given a [place] sandwich.  He is also given potassium supplementation due to potassium 3.9.  Overall he is very well-appearing and he is requesting to be discharged.  Clinical Course as of 11/20/23 1909  Thu Nov 20, 2023  1706 Potassium: 3.9 Will replete w/ 40 mEq PO w/ SQ insulin  [HN]  1706 Glucose(!): 465 Giving 10 U SQ novolog  [HN]  1706 Anion gap: 14 No elevated anion gap [HN]  1811 Beta-Hydroxybutyric Acid: 0.11 neg [HN]  1835 No urinary sxs, no leukocytosis or fever. No c/f UTI or DKA. Patient requesting food and discharge. Given [place] sandwich and renewed rx's for  insulin . Instructed to take his insulin  as prescribed and f/u with PCP in 1 week .DC w/ discharge instructions/return precautions. All questions answered to patient's satisfaction.   [HN]  1842 Glucose-Capillary(!): 187 [HN]  1909 Urinalysis, Routine w reflex microscopic -Urine, Clean Catch(!) No UTI [HN]    Clinical Course User Index [HN] Franklyn Sid SAILOR, MD    Labs: I Ordered, and personally interpreted labs.  The pertinent results include: Those listed above  Additional history obtained from chart review.    Reevaluation: After the interventions noted above, I reevaluated the patient and found that they have :improved  Social Determinants of Health:  homeless  Disposition:  DC w/ discharge instructions/return precautions. All questions answered to patient's satisfaction.    Co morbidities that complicate the patient evaluation  Past Medical History:  Diagnosis Date   Angioedema of lips 07/28/2012   left upper (07/29/2012)   Arthritis    hands and back   Chronic back pain    Cocaine abuse (HCC)    Diabetic neuropathy (HCC)    High cholesterol    Hypertension    Neuropathy    Type II diabetes mellitus (HCC)      Medicines Meds ordered this encounter  Medications   lactated ringers  bolus 1,000 mL   potassium chloride  SA (KLOR-CON  M) CR tablet 40 mEq   insulin  aspart (novoLOG ) injection 10 Units   insulin  aspart (NOVOLOG ) 100 UNIT/ML injection    Sig: Inject 0-9 Units into the skin 3 (three) times daily with meals.    Dispense:  10 mL    Refill:  3   insulin  glargine (LANTUS  SOLOSTAR) 100 UNIT/ML Solostar Pen    Sig: Inject 30 Units into the skin daily.    Dispense:  15 mL    Refill:  2    I have reviewed the patients home medicines and have made adjustments as needed  Problem List / ED Course: Problem List Items Addressed This Visit       Other   Hyperglycemia - Primary   Other Visit Diagnoses  Other fatigue                        This note was created using dictation software, which may contain spelling or grammatical errors.    Franklyn Sid SAILOR, MD 11/20/23 [phone]

## 2023-11-20 NOTE — ED Triage Notes (Signed)
 BIB EMS from the street.  Reports malaise and non compliance with meds bc he claims his insulin  got hot in a car.  Patient not cooperative with assessment.  Patient already asking for something to drink   CBG 497  HR 90 BP 182/90

## 2023-11-21 ENCOUNTER — Other Ambulatory Visit: Payer: Self-pay

## 2023-11-21 ENCOUNTER — Other Ambulatory Visit (HOSPITAL_COMMUNITY): Payer: Self-pay

## 2023-11-26 ENCOUNTER — Observation Stay (HOSPITAL_COMMUNITY)
Admission: EM | Admit: 2023-11-26 | Discharge: 2023-11-29 | Disposition: A | Discharge status: Home / Self Care | Payer: MEDICAID | Attending: Internal Medicine | Admitting: Internal Medicine

## 2023-11-26 ENCOUNTER — Encounter (HOSPITAL_COMMUNITY): Payer: Self-pay

## 2023-11-26 DIAGNOSIS — K219 Gastro-esophageal reflux disease without esophagitis: Secondary | ICD-10-CM | POA: Diagnosis present

## 2023-11-26 DIAGNOSIS — Z5900 Homelessness unspecified: Secondary | ICD-10-CM | POA: Diagnosis not present

## 2023-11-26 DIAGNOSIS — Z5901 Sheltered homelessness: Secondary | ICD-10-CM

## 2023-11-26 DIAGNOSIS — E1165 Type 2 diabetes mellitus with hyperglycemia: Secondary | ICD-10-CM | POA: Diagnosis not present

## 2023-11-26 DIAGNOSIS — R109 Unspecified abdominal pain: Secondary | ICD-10-CM | POA: Diagnosis present

## 2023-11-26 DIAGNOSIS — I10 Essential (primary) hypertension: Secondary | ICD-10-CM | POA: Diagnosis present

## 2023-11-26 DIAGNOSIS — R1110 Vomiting, unspecified: Secondary | ICD-10-CM | POA: Diagnosis present

## 2023-11-26 DIAGNOSIS — E785 Hyperlipidemia, unspecified: Secondary | ICD-10-CM | POA: Diagnosis not present

## 2023-11-26 DIAGNOSIS — R112 Nausea with vomiting, unspecified: Secondary | ICD-10-CM | POA: Diagnosis present

## 2023-11-26 DIAGNOSIS — N179 Acute kidney failure, unspecified: Secondary | ICD-10-CM | POA: Diagnosis not present

## 2023-11-26 LAB — CBC WITH DIFFERENTIAL/PLATELET
Abs Immature Granulocytes: 0.11 K/uL — ABNORMAL HIGH (ref 0.00–0.07)
Basophils Absolute: 0.1 K/uL (ref 0.0–0.1)
Basophils Relative: 0 %
Eosinophils Absolute: 0.3 K/uL (ref 0.0–0.5)
Eosinophils Relative: 2 %
HCT: 45.5 % (ref 39.0–52.0)
Hemoglobin: 14.1 g/dL (ref 13.0–17.0)
Immature Granulocytes: 1 %
Lymphocytes Relative: 7 %
Lymphs Abs: 1.5 K/uL (ref 0.7–4.0)
MCH: 29.9 pg (ref 26.0–34.0)
MCHC: 31 g/dL (ref 30.0–36.0)
MCV: 96.4 fL (ref 80.0–100.0)
Monocytes Absolute: 0.6 K/uL (ref 0.1–1.0)
Monocytes Relative: 3 %
Neutro Abs: 17.8 K/uL — ABNORMAL HIGH (ref 1.7–7.7)
Neutrophils Relative %: 87 %
Platelets: 412 K/uL — ABNORMAL HIGH (ref 150–400)
RBC: 4.72 MIL/uL (ref 4.22–5.81)
RDW: 13.2 % (ref 11.5–15.5)
Smear Review: NORMAL
WBC: 20.4 K/uL — ABNORMAL HIGH (ref 4.0–10.5)
nRBC: 0 % (ref 0.0–0.2)

## 2023-11-26 LAB — COMPREHENSIVE METABOLIC PANEL WITH GFR
ALT: 16 U/L (ref 0–44)
AST: 18 U/L (ref 15–41)
Albumin: 4 g/dL (ref 3.5–5.0)
Alkaline Phosphatase: 106 U/L (ref 38–126)
Anion gap: 17 — ABNORMAL HIGH (ref 5–15)
BUN: 30 mg/dL — ABNORMAL HIGH (ref 8–23)
CO2: 20 mmol/L — ABNORMAL LOW (ref 22–32)
Calcium: 9.2 mg/dL (ref 8.9–10.3)
Chloride: 100 mmol/L (ref 98–111)
Creatinine, Ser: 1.46 mg/dL — ABNORMAL HIGH (ref 0.61–1.24)
GFR, Estimated: 53 mL/min — ABNORMAL LOW (ref >60)
Glucose, Bld: 272 mg/dL — ABNORMAL HIGH (ref 70–99)
Potassium: 4.4 mmol/L (ref 3.5–5.1)
Sodium: 137 mmol/L (ref 135–145)
Total Bilirubin: 1.2 mg/dL (ref 0.0–1.2)
Total Protein: 7.5 g/dL (ref 6.5–8.1)

## 2023-11-26 LAB — MAGNESIUM: Magnesium: 1.9 mg/dL (ref 1.7–2.4)

## 2023-11-26 LAB — LIPASE, BLOOD: Lipase: 15 U/L (ref 11–51)

## 2023-11-26 MED ORDER — SODIUM CHLORIDE 0.9 % IV BOLUS
1000.0000 mL | Freq: Once | INTRAVENOUS | Status: AC
  Administered 2023-11-26: 1000 mL via INTRAVENOUS

## 2023-11-26 NOTE — ED Triage Notes (Signed)
 Pt presents with c/o weakness secondary to diarrhea and vomiting that started yesterday. Pt was recently admitted to the hospital, EMS unsure of what he was admitted. Pt is currently CBG of 396, pt reports this is normal for him. Pt reports the vomiting and diarrhea started yesterday.

## 2023-11-26 NOTE — ED Provider Notes (Incomplete)
 [place] EMERGENCY DEPARTMENT AT [facility] Provider Note   CSN: [id number] Arrival date & time: 11/26/23  1336     Patient presents with: Vomiting   Dallas Torok is a 63 y.o. male. Dehydrated from vomiting and diarrhea since yesterday. No abdominal pain. Reports high blood sugar.   Denies fever, chest pain, dyspnea, cough, dysuria, hematuria, hematochezia.   {Add pertinent medical, surgical, social history, OB history to HPI:32947} HPI     Prior to Admission medications   Medication Sig Start Date End Date Taking? Authorizing Provider  Accu-Chek Softclix Lancets lancets Use to check blood sugar three times daily as directed 11/01/23   Shrivastava, Aryendra, MD  amLODipine  (NORVASC ) 10 MG tablet Take 1 tablet (10 mg total) by mouth daily. 11/01/23   Shrivastava, Aryendra, MD  atorvastatin  (LIPITOR) 40 MG tablet Take 1 tablet (40 mg total) by mouth daily. 11/01/23   Shrivastava, Aryendra, MD  Blood Glucose Monitoring Suppl (ACCU-CHEK GUIDE ME) w/Device KIT Use to test blood sugar 3 times daily. 11/01/23   Shrivastava, Aryendra, MD  doxepin  (SINEQUAN ) 25 MG capsule Take 1 capsule (25 mg total) by mouth at bedtime. 11/01/23   Shrivastava, Aryendra, MD  DULoxetine  (CYMBALTA ) 60 MG capsule Take 1 capsule (60 mg total) by mouth daily. 11/01/23   Shrivastava, Aryendra, MD  gabapentin  (NEURONTIN ) 300 MG capsule Take 1 capsule (300 mg total) by mouth 3 (three) times daily. 11/01/23   Shrivastava, Aryendra, MD  glucose blood (ACCU-CHEK GUIDE TEST) test strip Use as instructed to check blood sugar three times daily 11/01/23   Shrivastava, Aryendra, MD  hydrOXYzine  (ATARAX ) 10 MG tablet Take 1 tablet (10 mg total) by mouth 3 (three) times daily as needed FOR ANXIETY. 11/01/23   Hellen Becket, MD  insulin  aspart (NOVOLOG ) 100 UNIT/ML injection Inject 0-9 Units into the skin 3 (three) times daily with meals. 11/20/23   Franklyn Sid SAILOR, MD  insulin  glargine (LANTUS  SOLOSTAR) 100  UNIT/ML Solostar Pen Inject 30 Units into the skin daily. 11/20/23   Franklyn Sid SAILOR, MD  lidocaine  (LIDODERM ) 5 % Place 1 patch onto the skin daily. Remove & Discard patch within 12 hours or as directed by MD Patient not taking: Reported on 11/03/2023 11/01/23   Shrivastava, Aryendra, MD  loperamide  (IMODIUM ) 2 MG capsule Take 1 capsule (2 mg total) by mouth 4 (four) times daily as needed for diarrhea or loose stools. 11/16/23   [name], DO  metFORMIN  (GLUCOPHAGE ) 500 MG tablet Take 1 tablet (500 mg total) by mouth 2 (two) times daily with a meal. 11/03/23   [name], PA  nicotine  (NICODERM CQ  - DOSED IN MG/24 HR) 7 mg/24hr patch Place 1 patch (7 mg total) onto the skin daily as needed (nicotine  withdrawal). Patient not taking: Reported on 11/03/2023 11/01/23   Shrivastava, Aryendra, MD  ondansetron  (ZOFRAN -ODT) 4 MG disintegrating tablet Dissolve one tablet in mouth every 4 hours as needed for nausea/vomiting. 11/16/23   [name], DO    Allergies: Lisinopril     Review of Systems  Updated Vital Signs BP 112/89 (BP Location: Right Arm)   Pulse (!) 107   Temp 98.2 F (36.8 C) (Oral)   Resp 17   SpO2 97%   Physical Exam  (all labs ordered are listed, but only abnormal results are displayed) Labs Reviewed  CBC WITH DIFFERENTIAL/PLATELET - Abnormal; Notable for the following components:      Result Value   WBC 20.4 (*)    Platelets 412 (*)  Neutro Abs 17.8 (*)    Abs Immature Granulocytes 0.11 (*)    All other components within normal limits  COMPREHENSIVE METABOLIC PANEL WITH GFR - Abnormal; Notable for the following components:   CO2 20 (*)    Glucose, Bld 272 (*)    BUN 30 (*)    Creatinine, Ser 1.46 (*)    GFR, Estimated 53 (*)    Anion gap 17 (*)    All other components within normal limits  LIPASE, BLOOD  MAGNESIUM     EKG: None  Radiology: No results found.  {Document cardiac monitor, telemetry assessment procedure when appropriate:32947} Procedures    Medications Ordered in the ED - No data to display    {Click here for ABCD2, HEART and other calculators REFRESH Note before signing:1}                              Medical Decision Making  ***  {Document critical care time when appropriate  Document review of labs and clinical decision tools ie [id number], etc  Document your independent review of radiology images and any outside records  Document your discussion with family members, caretakers and with consultants  Document social determinants of health affecting pt's care  Document your decision making why or why not admission, treatments were needed:32947:::1}   Final diagnoses:  None    ED Discharge Orders     None

## 2023-11-27 ENCOUNTER — Other Ambulatory Visit (HOSPITAL_COMMUNITY): Payer: Self-pay

## 2023-11-27 ENCOUNTER — Emergency Department (HOSPITAL_COMMUNITY): Payer: MEDICAID

## 2023-11-27 DIAGNOSIS — R112 Nausea with vomiting, unspecified: Secondary | ICD-10-CM

## 2023-11-27 DIAGNOSIS — R197 Diarrhea, unspecified: Secondary | ICD-10-CM | POA: Diagnosis not present

## 2023-11-27 DIAGNOSIS — R109 Unspecified abdominal pain: Secondary | ICD-10-CM | POA: Diagnosis present

## 2023-11-27 LAB — URINALYSIS, ROUTINE W REFLEX MICROSCOPIC
Bilirubin Urine: NEGATIVE
Glucose, UA: 150 mg/dL — AB
Hgb urine dipstick: NEGATIVE
Ketones, ur: NEGATIVE mg/dL
Leukocytes,Ua: NEGATIVE
Nitrite: NEGATIVE
Protein, ur: NEGATIVE mg/dL
Specific Gravity, Urine: 1.042 — ABNORMAL HIGH (ref 1.005–1.030)
pH: 5 (ref 5.0–8.0)

## 2023-11-27 LAB — GLUCOSE, CAPILLARY
Glucose-Capillary: 190 mg/dL — ABNORMAL HIGH (ref 70–99)
Glucose-Capillary: 232 mg/dL — ABNORMAL HIGH (ref 70–99)

## 2023-11-27 LAB — C DIFFICILE QUICK SCREEN W PCR REFLEX

## 2023-11-27 LAB — BASIC METABOLIC PANEL WITH GFR
Anion gap: 13 (ref 5–15)
BUN: 27 mg/dL — ABNORMAL HIGH (ref 8–23)
CO2: 19 mmol/L — ABNORMAL LOW (ref 22–32)
Calcium: 8.7 mg/dL — ABNORMAL LOW (ref 8.9–10.3)
Chloride: 105 mmol/L (ref 98–111)
Creatinine, Ser: 1.24 mg/dL (ref 0.61–1.24)
GFR, Estimated: >60 mL/min (ref >60)
Glucose, Bld: 263 mg/dL — ABNORMAL HIGH (ref 70–99)
Potassium: 4.6 mmol/L (ref 3.5–5.1)
Sodium: 137 mmol/L (ref 135–145)

## 2023-11-27 LAB — CBC
HCT: 40.4 % (ref 39.0–52.0)
Hemoglobin: 12.6 g/dL — ABNORMAL LOW (ref 13.0–17.0)
MCH: 30.4 pg (ref 26.0–34.0)
MCHC: 31.2 g/dL (ref 30.0–36.0)
MCV: 97.6 fL (ref 80.0–100.0)
Platelets: 309 K/uL (ref 150–400)
RBC: 4.14 MIL/uL — ABNORMAL LOW (ref 4.22–5.81)
RDW: 13.2 % (ref 11.5–15.5)
WBC: 12.8 K/uL — ABNORMAL HIGH (ref 4.0–10.5)
nRBC: 0 % (ref 0.0–0.2)

## 2023-11-27 MED ORDER — DULOXETINE HCL 60 MG PO CPEP
60.0000 mg | ORAL_CAPSULE | Freq: Every day | ORAL | Status: DC
  Administered 2023-11-27 – 2023-11-29 (×3): 60 mg via ORAL
  Filled 2023-11-27 (×3): qty 1

## 2023-11-27 MED ORDER — ATORVASTATIN CALCIUM 20 MG PO TABS
40.0000 mg | ORAL_TABLET | Freq: Every day | ORAL | Status: DC
  Administered 2023-11-27 – 2023-11-29 (×3): 40 mg via ORAL
  Filled 2023-11-27 (×3): qty 2

## 2023-11-27 MED ORDER — ONDANSETRON HCL 4 MG/2ML IJ SOLN
4.0000 mg | Freq: Once | INTRAMUSCULAR | Status: AC
  Administered 2023-11-27: 4 mg via INTRAVENOUS
  Filled 2023-11-27: qty 2

## 2023-11-27 MED ORDER — GABAPENTIN 100 MG PO CAPS
300.0000 mg | ORAL_CAPSULE | Freq: Three times a day (TID) | ORAL | Status: DC
  Administered 2023-11-27 – 2023-11-29 (×6): 300 mg via ORAL
  Filled 2023-11-27 (×6): qty 3

## 2023-11-27 MED ORDER — PANTOPRAZOLE SODIUM 40 MG PO TBEC
40.0000 mg | DELAYED_RELEASE_TABLET | Freq: Every day | ORAL | Status: DC
Start: 2023-11-27 — End: 2023-11-29
  Administered 2023-11-27 – 2023-11-29 (×3): 40 mg via ORAL
  Filled 2023-11-27 (×3): qty 1

## 2023-11-27 MED ORDER — INSULIN ASPART 100 UNIT/ML IJ SOLN
0.0000 [IU] | Freq: Three times a day (TID) | INTRAMUSCULAR | Status: DC
  Administered 2023-11-28: 3 [IU] via SUBCUTANEOUS
  Administered 2023-11-28: 8 [IU] via SUBCUTANEOUS
  Administered 2023-11-29: 5 [IU] via SUBCUTANEOUS
  Administered 2023-11-29: 3 [IU] via SUBCUTANEOUS

## 2023-11-27 MED ORDER — SODIUM CHLORIDE 0.9 % IV BOLUS
1000.0000 mL | Freq: Once | INTRAVENOUS | Status: AC
  Administered 2023-11-27: 1000 mL via INTRAVENOUS

## 2023-11-27 MED ORDER — HYDROXYZINE HCL 25 MG PO TABS
25.0000 mg | ORAL_TABLET | Freq: Four times a day (QID) | ORAL | Status: DC | PRN
  Administered 2023-11-28: 25 mg via ORAL
  Filled 2023-11-27: qty 1

## 2023-11-27 MED ORDER — LACTATED RINGERS IV SOLN: INTRAVENOUS | Status: AC

## 2023-11-27 MED ORDER — ONDANSETRON HCL 4 MG/2ML IJ SOLN
4.0000 mg | Freq: Four times a day (QID) | INTRAMUSCULAR | Status: DC | PRN
  Administered 2023-11-28: 4 mg via INTRAVENOUS
  Filled 2023-11-27: qty 2

## 2023-11-27 MED ORDER — ACETAMINOPHEN 325 MG PO TABS: 650.0000 mg | ORAL_TABLET | Freq: Four times a day (QID) | ORAL | Status: DC | PRN

## 2023-11-27 MED ORDER — DOXEPIN HCL 25 MG PO CAPS: 25.0000 mg | ORAL_CAPSULE | Freq: Every day | ORAL | Status: DC

## 2023-11-27 MED ORDER — TRAZODONE HCL 50 MG PO TABS
50.0000 mg | ORAL_TABLET | Freq: Once | ORAL | Status: AC
  Administered 2023-11-27: 50 mg via ORAL
  Filled 2023-11-27: qty 1

## 2023-11-27 MED ORDER — IOHEXOL 300 MG/ML  SOLN
80.0000 mL | Freq: Once | INTRAMUSCULAR | Status: AC | PRN
  Administered 2023-11-27: 80 mL via INTRAVENOUS

## 2023-11-27 MED ORDER — ACETAMINOPHEN 650 MG RE SUPP: 650.0000 mg | Freq: Four times a day (QID) | RECTAL | Status: DC | PRN

## 2023-11-27 MED ORDER — ONDANSETRON HCL 4 MG PO TABS
4.0000 mg | ORAL_TABLET | Freq: Four times a day (QID) | ORAL | Status: DC | PRN
  Administered 2023-11-28: 4 mg via ORAL
  Filled 2023-11-27: qty 1

## 2023-11-27 NOTE — Progress Notes (Signed)
 Patient requesting trazodone  for HS. He states he can't sleep without it. Notified A. Andrez, NP. Will continue to monitor.

## 2023-11-27 NOTE — Plan of Care (Signed)

## 2023-11-27 NOTE — H&P (Signed)
 History and Physical    Patient: David Wyatt FMW:[id number] DOB: [birth year] DOA: 11/26/2023 DOS: the patient was seen and examined on 11/27/2023 PCP: Delbert Clam, MD  Patient coming from: Home  Chief Complaint:  Chief Complaint  Patient presents with   Vomiting   HPI: David Wyatt is a 63 y.o. male with medical history significant of angioedema of the lips, osteoarthritis of the hands and back, lumbar disc herniation, chronic back pain, grade I diastolic dysfunction, history of left kidney mass, type 2 diabetes, diabetic neuropathy, hyperlipidemia, hypertension, cocaine abuse, tobacco abuse, substance-induced mood disorder, depression, history of suicidal behavior, homelessness who presented to the emergency department with complaints of abdominal pain, nausea, emesis and diarrhea.  No constipation, melena or hematochezia.  No flank pain, dysuria, frequency or hematuria. He denied fever, chills, rhinorrhea, sore throat, wheezing or hemoptysis.  No chest pain, palpitations, diaphoresis, PND, orthopnea or pitting edema of the lower extremities.  No polyuria, polydipsia, polyphagia or blurred vision.   ED course: Initial vital signs were temperature 98.1 F, pulse 108, respirations 16, BP 110/90 mmHg and O2 sat 96% on room air. The patient received ondansetron  4 mg IVP and 1000 mL of normal saline bolus.  Enteric precautions discontinued as the patient was having solid stools.  Lab work: Urinalysis showed specific gravity 1.042 and glucose of 150 mg/dL.  CBC showed a white count 20.4 with 87% neutrophils, hemoglobin 14.1 g/dL and platelets 587.  Normal lipase and magnesium .  CMP showed a CO2 of 20 mmol/L with an anion gap of 17.  Glucose 272, BUN 30 and creatinine 1.46 mg/dL.  LFTs were normal.   Review of Systems: As mentioned in the history of present illness. All other systems reviewed and are negative. Past Medical History:  Diagnosis Date   Angioedema of lips 07/28/2012   left upper  (07/29/2012)   Arthritis    hands and back   Chronic back pain    Cocaine abuse (HCC)    Diabetic neuropathy (HCC)    High cholesterol    Hypertension    Neuropathy    Type II diabetes mellitus (HCC)    Past Surgical History:  Procedure Laterality Date   BACK SURGERY     CYSTOSCOPY W/ URETERAL STENT PLACEMENT Right 02/23/2021   Procedure: CYSTOSCOPY WITH RETROGRADE PYELOGRAM/URETERAL STENT PLACEMENT;  Surgeon: Selma Donnice SAUNDERS, MD;  Location: [facility];  Service: Urology;  Laterality: Right;   CYSTOSCOPY/URETEROSCOPY/HOLMIUM LASER/STENT PLACEMENT Right 12/05/2022   Procedure: CYSTOSCOPY, RIGHT URETEROSCOPY, AND RIGHT URETERAL STENT PLACEMENT;  Surgeon: Shane Steffan BROCKS, MD;  Location: [facility];  Service: Urology;  Laterality: Right;  90 MINUTES   HERNIA REPAIR Right 04/01/2012   I & D EXTREMITY Left 12/13/2012   Procedure: IRRIGATION AND DEBRIDEMENT LEFT THUMB;  Surgeon: Franky SAUNDERS Curia, MD;  Location: [facility] OR;  Service: Orthopedics;  Laterality: Left;   INCISION AND DRAINAGE OF WOUND     boil on back/notes 07/14/2008 (07/29/2012)   INGUINAL HERNIA REPAIR  04/01/2012   Procedure: HERNIA REPAIR INGUINAL ADULT;  Surgeon: Sherlean JINNY Laughter, MD;  Location: [facility] OR;  Service: General;  Laterality: Right;   INGUINAL HERNIA REPAIR Left 09/06/2016   Procedure: OPEN REPAIR LEFT INGUINAL HERNIA;  Surgeon: Tanda Locus, MD;  Location: [facility] OR;  Service: General;  Laterality: Left;   INSERTION OF MESH Left 09/06/2016   Procedure: INSERTION OF MESH;  Surgeon: Tanda Locus, MD;  Location: [facility] OR;  Service: General;  Laterality: Left;   TONSILLECTOMY     Social  History:  reports that he has been smoking cigarettes. He has a 7.5 pack-year smoking history. He quit smokeless tobacco use about 8 years ago. He reports current drug use. Drug: Crack cocaine. He reports that he does not drink alcohol.  Allergies  Allergen Reactions   Lisinopril  Anaphylaxis, Swelling and Other (See Comments)    Lip Swelling and angioedema,  too     Family History  Problem Relation Age of Onset   Diabetes Mother    Kidney disease Mother    Hyperlipidemia Mother    Hyperlipidemia Father    Diabetes Father     Prior to Admission medications   Medication Sig Start Date End Date Taking? Authorizing Provider  Accu-Chek Softclix Lancets lancets Use to check blood sugar three times daily as directed 11/01/23   Shrivastava, Aryendra, MD  amLODipine  (NORVASC ) 10 MG tablet Take 1 tablet (10 mg total) by mouth daily. 11/01/23   Shrivastava, Aryendra, MD  atorvastatin  (LIPITOR) 40 MG tablet Take 1 tablet (40 mg total) by mouth daily. 11/01/23   Shrivastava, Aryendra, MD  Blood Glucose Monitoring Suppl (ACCU-CHEK GUIDE ME) w/Device KIT Use to test blood sugar 3 times daily. 11/01/23   Shrivastava, Aryendra, MD  doxepin  (SINEQUAN ) 25 MG capsule Take 1 capsule (25 mg total) by mouth at bedtime. 11/01/23   Shrivastava, Aryendra, MD  DULoxetine  (CYMBALTA ) 60 MG capsule Take 1 capsule (60 mg total) by mouth daily. 11/01/23   Shrivastava, Aryendra, MD  gabapentin  (NEURONTIN ) 300 MG capsule Take 1 capsule (300 mg total) by mouth 3 (three) times daily. 11/01/23   Shrivastava, Aryendra, MD  glucose blood (ACCU-CHEK GUIDE TEST) test strip Use as instructed to check blood sugar three times daily 11/01/23   Shrivastava, Aryendra, MD  hydrOXYzine  (ATARAX ) 10 MG tablet Take 1 tablet (10 mg total) by mouth 3 (three) times daily as needed FOR ANXIETY. 11/01/23   Hellen Becket, MD  insulin  aspart (NOVOLOG ) 100 UNIT/ML injection Inject 0-9 Units into the skin 3 (three) times daily with meals. 11/20/23   Franklyn Sid SAILOR, MD  insulin  glargine (LANTUS  SOLOSTAR) 100 UNIT/ML Solostar Pen Inject 30 Units into the skin daily. 11/20/23   Franklyn Sid SAILOR, MD  lidocaine  (LIDODERM ) 5 % Place 1 patch onto the skin daily. Remove & Discard patch within 12 hours or as directed by MD Patient not taking: Reported on 11/03/2023 11/01/23   Shrivastava, Aryendra, MD  loperamide   (IMODIUM ) 2 MG capsule Take 1 capsule (2 mg total) by mouth 4 (four) times daily as needed for diarrhea or loose stools. 11/16/23   [name], DO  metFORMIN  (GLUCOPHAGE ) 500 MG tablet Take 1 tablet (500 mg total) by mouth 2 (two) times daily with a meal. 11/03/23   [name], PA  nicotine  (NICODERM CQ  - DOSED IN MG/24 HR) 7 mg/24hr patch Place 1 patch (7 mg total) onto the skin daily as needed (nicotine  withdrawal). Patient not taking: Reported on 11/03/2023 11/01/23   Shrivastava, Aryendra, MD  ondansetron  (ZOFRAN -ODT) 4 MG disintegrating tablet Dissolve one tablet in mouth every 4 hours as needed for nausea/vomiting. 11/16/23   [name], DO    Physical Exam: Vitals:   11/27/23 0645 11/27/23 0700 11/27/23 0715 11/27/23 0732  BP: 101/72 99/74 95/68    Pulse: 86 82 81   Resp: 17 13 12    Temp:    99.2 F (37.3 C)  TempSrc:    Oral  SpO2: 95% 100% 99%    Physical Exam Vitals and nursing note reviewed.  Constitutional:  General: He is awake. He is not in acute distress.    Appearance: Normal appearance. He is ill-appearing.  HENT:     Head: Normocephalic.     Nose: No rhinorrhea.     Mouth/Throat:     Mouth: Mucous membranes are moist.  Eyes:     General: No scleral icterus.    Pupils: Pupils are equal, round, and reactive to light.  Neck:     Vascular: No JVD.  Cardiovascular:     Rate and Rhythm: Normal rate and regular rhythm.     Heart sounds: S1 normal and S2 normal.  Pulmonary:     Effort: Pulmonary effort is normal.     Breath sounds: Normal breath sounds. No wheezing, rhonchi or rales.  Abdominal:     General: Bowel sounds are normal. There is no distension.     Palpations: Abdomen is soft.     Tenderness: There is abdominal tenderness. There is no right CVA tenderness or left CVA tenderness.  Musculoskeletal:     Cervical back: Neck supple.     Right lower leg: No edema.     Left lower leg: No edema.  Skin:    General: Skin is warm and dry.  Neurological:      General: No focal deficit present.     Mental Status: He is alert and oriented to person, place, and time.  Psychiatric:        Mood and Affect: Mood normal.        Behavior: Behavior normal. Behavior is cooperative.     Data Reviewed:  Results are pending, will review when available.  Assessment and Plan: Principal Problem:   AKI (acute kidney injury) (HCC) In the setting of:   Nausea vomiting and diarrhea Associated with:   Abdominal pain Observation/MedSurg. Continue IV fluids. Advance diet as tolerated. Analgesics as needed. Antiemetics as needed. Pantoprazole  40 mg IVP daily. Follow CBC, CMP in AM. Avoid hypotension. Avoid nephrotoxins. Monitor intake and output. Monitor renal function electrolytes.  Active Problems:   Uncontrolled type 2 diabetes mellitus with hyperglycemia (HCC) Carbohydrate modified diet. Received IV contrast. -Hold metformin  for 48 hours. CBG monitoring with RI SS. Check hemoglobin A1c.    Essential hypertension, benign Blood pressures are soft. Hold amlodipine  for now.    Hyperlipidemia Continue atorvastatin  20 mg p.o. daily.    GERD (gastroesophageal reflux disease) Resume pantoprazole  40 mg p.o. daily.    Homelessness Consult TOC team.     Advance Care Planning:   Code Status: Full Code   Consults:   Family Communication:   Severity of Illness: The appropriate patient status for this patient is OBSERVATION. Observation status is judged to be reasonable and necessary in order to provide the required intensity of service to ensure the patient's safety. The patient's presenting symptoms, physical exam findings, and initial radiographic and laboratory data in the context of their medical condition is felt to place them at decreased risk for further clinical deterioration. Furthermore, it is anticipated that the patient will be medically stable for discharge from the hospital within 2 midnights of admission.   Author: Alm Dorn Castor, MD 11/27/2023 8:15 AM  For on call review [email or web address].   This document was prepared using Dragon voice recognition software and may contain some unintended transcription errors.

## 2023-11-27 NOTE — ED Notes (Signed)
Gave pt breakfast tray

## 2023-11-28 ENCOUNTER — Other Ambulatory Visit: Payer: Self-pay

## 2023-11-28 ENCOUNTER — Observation Stay (HOSPITAL_COMMUNITY): Payer: MEDICAID

## 2023-11-28 DIAGNOSIS — R112 Nausea with vomiting, unspecified: Secondary | ICD-10-CM | POA: Diagnosis not present

## 2023-11-28 DIAGNOSIS — R197 Diarrhea, unspecified: Secondary | ICD-10-CM | POA: Diagnosis not present

## 2023-11-28 LAB — BASIC METABOLIC PANEL WITH GFR
Anion gap: 13 (ref 5–15)
BUN: 20 mg/dL (ref 8–23)
CO2: 19 mmol/L — ABNORMAL LOW (ref 22–32)
Calcium: 8.6 mg/dL — ABNORMAL LOW (ref 8.9–10.3)
Chloride: 105 mmol/L (ref 98–111)
Creatinine, Ser: 1.09 mg/dL (ref 0.61–1.24)
GFR, Estimated: >60 mL/min (ref >60)
Glucose, Bld: 260 mg/dL — ABNORMAL HIGH (ref 70–99)
Potassium: 4.6 mmol/L (ref 3.5–5.1)
Sodium: 137 mmol/L (ref 135–145)

## 2023-11-28 LAB — GLUCOSE, CAPILLARY
Glucose-Capillary: 270 mg/dL — ABNORMAL HIGH (ref 70–99)
Glucose-Capillary: 97 mg/dL (ref 70–99)
Glucose-Capillary: 188 mg/dL — ABNORMAL HIGH (ref 70–99)
Glucose-Capillary: 186 mg/dL — ABNORMAL HIGH (ref 70–99)

## 2023-11-28 LAB — CBC
HCT: 38.8 % — ABNORMAL LOW (ref 39.0–52.0)
Hemoglobin: 12.3 g/dL — ABNORMAL LOW (ref 13.0–17.0)
MCH: 31 pg (ref 26.0–34.0)
MCHC: 31.7 g/dL (ref 30.0–36.0)
MCV: 97.7 fL (ref 80.0–100.0)
Platelets: 284 K/uL (ref 150–400)
RBC: 3.97 MIL/uL — ABNORMAL LOW (ref 4.22–5.81)
RDW: 13 % (ref 11.5–15.5)
WBC: 11.9 K/uL — ABNORMAL HIGH (ref 4.0–10.5)
nRBC: 0 % (ref 0.0–0.2)

## 2023-11-28 NOTE — Progress Notes (Signed)
 PROGRESS NOTE    David Wyatt  FMW:[id number] DOB: [birth year] DOA: 11/26/2023 PCP: Delbert Clam, MD  Outpatient Specialists:     Brief Narrative:  As per H&P done on presentation: David Wyatt is a 63 y.o. male with medical history significant of angioedema of the lips, osteoarthritis of the hands and back, lumbar disc herniation, chronic back pain, grade I diastolic dysfunction, history of left kidney mass, type 2 diabetes, diabetic neuropathy, hyperlipidemia, hypertension, cocaine abuse, tobacco abuse, substance-induced mood disorder, depression, history of suicidal behavior, homelessness who presented to the emergency department with complaints of abdominal pain, nausea, emesis and diarrhea.  No constipation, melena or hematochezia.  No flank pain, dysuria, frequency or hematuria. He denied fever, chills, rhinorrhea, sore throat, wheezing or hemoptysis.  No chest pain, palpitations, diaphoresis, PND, orthopnea or pitting edema of the lower extremities.  No polyuria, polydipsia, polyphagia or blurred vision.    ED course: Initial vital signs were temperature 98.1 F, pulse 108, respirations 16, BP 110/90 mmHg and O2 sat 96% on room air. The patient received ondansetron  4 mg IVP and 1000 mL of normal saline bolus.  Enteric precautions discontinued as the patient was having solid stools.   Lab work: Urinalysis showed specific gravity 1.042 and glucose of 150 mg/dL.  CBC showed a white count 20.4 with 87% neutrophils, hemoglobin 14.1 g/dL and platelets 587.  Normal lipase and magnesium .  CMP showed a CO2 of 20 mmol/L with an anion gap of 17.  Glucose 272, BUN 30 and creatinine 1.46 mg/dL.  LFTs were normal.  11/28/2023: Patient is homeless.  Patient continues to report diarrhea stools, however, stool samples sent for analysis yesterday were said to be formed.  Patient also reports nausea and vomiting, however, patient finished his breakfast.  Leukocytosis has improved significantly.  Acute kidney  injury has resolved, suspect prerenal.   Assessment & Plan:   Principal Problem:   Nausea vomiting and diarrhea Active Problems:   Essential hypertension, benign   GERD (gastroesophageal reflux disease)   Hyperlipidemia   Uncontrolled type 2 diabetes mellitus with hyperglycemia (HCC)   Homelessness   AKI (acute kidney injury) (HCC)   Abdominal pain    Uncontrolled type 2 diabetes mellitus with hyperglycemia (HCC): - Blood sugar is controlled. - Continue carbohydrate modified diet. -Resume metformin  for 48 hours. -Continue CBG monitoring with sliding scale insulin  coverage.   -Last A1c done on 09/27/2023 was 13.9%.       Essential hypertension, benign -Controlled. - Last blood pressure was 130/73.   -Amlodipine  is still on hold.      Hyperlipidemia Continue atorvastatin  40 mg p.o. daily.     GERD (gastroesophageal reflux disease) Resume pantoprazole  40 mg p.o. daily.  Leukocytosis: - Improving.  WBC 12 from 20-11. - Suspect reactive.     Homelessness Consult TOC team   DVT prophylaxis: SCD. Code Status: Full code. Family Communication:  Disposition Plan: Pursue disposition.   Consultants:  None  Procedures:  None  Antimicrobials:  -None   Subjective: -Patient reports diarrhea stools, however, samples sent for analysis were said to be formed. - Patient reports nausea and vomiting, however, patient finished his meals.  Objective: Vitals:   11/27/23 2109 11/28/23 0141 11/28/23 0438 11/28/23 0548  BP: 132/83 131/67  (!) 145/80  Pulse: 83 68  79  Resp: 15 15  16   Temp: 98.4 F (36.9 C) 98.3 F (36.8 C)  98.4 F (36.9 C)  TempSrc: Oral Oral  Oral  SpO2: 97% 96%  93%  Weight:  72.6 kg   Height:   5' 8 (1.727 m)     Intake/Output Summary (Last 24 hours) at 11/28/2023 1437 Last data filed at 11/28/2023 0938 Gross per 24 hour  Intake 1421.09 ml  Output 1000 ml  Net 421.09 ml   Filed Weights   11/28/23 0438  Weight: 72.6 kg     Examination:  General exam: Appears calm and comfortable  Respiratory system: Clear to auscultation. Respiratory effort normal. Cardiovascular system: S1 & S2, with systolic murmur  Gastrointestinal system: Abdomen is soft and nontender.. Central nervous system: Awake and alert.   Extremities: Moves all extremities  Data Reviewed: I have personally reviewed following labs and imaging studies  CBC: Recent Labs  Lab 11/26/23 2030 11/27/23 1137 11/28/23 0844  WBC 20.4* 12.8* 11.9*  NEUTROABS 17.8*  --   --   HGB 14.1 12.6* 12.3*  HCT 45.5 40.4 38.8*  MCV 96.4 97.6 97.7  PLT 412* 309 284   Basic Metabolic Panel: Recent Labs  Lab 11/26/23 2030 11/27/23 1137 11/28/23 0429  NA 137 137 137  K 4.4 4.6 4.6  CL 100 105 105  CO2 20* 19* 19*  GLUCOSE 272* 263* 260*  BUN 30* 27* 20  CREATININE 1.46* 1.24 1.09  CALCIUM  9.2 8.7* 8.6*  MG 1.9  --   --    GFR: Estimated Creatinine Clearance: 66.2 mL/min (by C-G formula based on SCr of 1.09 mg/dL). Liver Function Tests: Recent Labs  Lab 11/26/23 2030  AST 18  ALT 16  ALKPHOS 106  BILITOT 1.2  PROT 7.5  ALBUMIN 4.0   Recent Labs  Lab 11/26/23 2030  LIPASE 15   No results for input(s): AMMONIA in the last 168 hours. Coagulation Profile: No results for input(s): INR, PROTIME in the last 168 hours. Cardiac Enzymes: No results for input(s): CKTOTAL, CKMB, CKMBINDEX, TROPONINI in the last 168 hours. BNP (last 3 results) No results for input(s): PROBNP in the last 8760 hours. HbA1C: No results for input(s): HGBA1C in the last 72 hours. CBG: Recent Labs  Lab 11/27/23 1648 11/27/23 2111 11/28/23 0729 11/28/23 1131  GLUCAP 190* 232* 270* 97   Lipid Profile: No results for input(s): CHOL, HDL, LDLCALC, TRIG, CHOLHDL, LDLDIRECT in the last 72 hours. Thyroid  Function Tests: No results for input(s): TSH, T4TOTAL, FREET4, T3FREE, THYROIDAB in the last 72 hours. Anemia  Panel: No results for input(s): VITAMINB12, FOLATE, FERRITIN, TIBC, IRON, RETICCTPCT in the last 72 hours. Urine analysis:    Component Value Date/Time   COLORURINE YELLOW 11/27/2023 0213   APPEARANCEUR CLEAR 11/27/2023 0213   LABSPEC 1.042 (H) 11/27/2023 0213   PHURINE 5.0 11/27/2023 0213   GLUCOSEU 150 (A) 11/27/2023 0213   HGBUR NEGATIVE 11/27/2023 0213   BILIRUBINUR NEGATIVE 11/27/2023 0213   BILIRUBINUR negative 03/26/2022 1531   BILIRUBINUR small 02/16/2019 1618   KETONESUR NEGATIVE 11/27/2023 0213   PROTEINUR NEGATIVE 11/27/2023 0213   UROBILINOGEN 0.2 03/26/2022 1531   UROBILINOGEN 1.0 10/12/2012 0149   NITRITE NEGATIVE 11/27/2023 0213   LEUKOCYTESUR NEGATIVE 11/27/2023 0213   Sepsis Labs: @LABRCNTIP (procalcitonin:4,lacticidven:4)  ) Recent Results (from the past 240 hours)  C Difficile Quick Screen w PCR reflex     Status: Abnormal   Collection Time: 11/27/23  6:31 AM   Specimen: STOOL  Result Value Ref Range Status   C Diff antigen (A) NEGATIVE Final    FORMED STOOL SPECIMEN SUBMITTED.  DOES NOT MEET TESTING CRITERIA, ORDER CREDITED   C Diff toxin (A) NEGATIVE Final  FORMED STOOL SPECIMEN SUBMITTED.  DOES NOT MEET TESTING CRITERIA, ORDER CREDITED   C Diff interpretation   Final    FORMED STOOL SPECIMEN SUBMITTED.  DOES NOT MEET TESTING CRITERIA, ORDER CREDITED    Comment:  JANESE TINNIE ORN 11/27/2023 9272 Performed at [facility], [address], [place], [place] [address]          Radiology Studies: CT ABDOMEN PELVIS W CONTRAST Result Date: 11/27/2023 CLINICAL DATA:  Acute abdominal pain and diarrhea, initial encounter EXAM: CT ABDOMEN AND PELVIS WITH CONTRAST TECHNIQUE: Multidetector CT imaging of the abdomen and pelvis was performed using the standard protocol following bolus administration of intravenous contrast. RADIATION DOSE REDUCTION: This exam was performed according to the departmental dose-optimization program  which includes automated exposure control, adjustment of the mA and/or kV according to patient size and/or use of iterative reconstruction technique. CONTRAST:  80mL OMNIPAQUE  IOHEXOL  300 MG/ML  SOLN COMPARISON:  None Available. FINDINGS: Lower chest: Mild emphysematous changes are noted. Left basilar atelectasis is seen. Hepatobiliary: No focal liver abnormality is seen. No gallstones, gallbladder wall thickening, or biliary dilatation. Pancreas: Unremarkable. No pancreatic ductal dilatation or surrounding inflammatory changes. Spleen: Normal in size without focal abnormality. Adrenals/Urinary Tract: Adrenal glands are within normal limits. Kidneys demonstrate a normal enhancement pattern bilaterally. Exophytic hypodense appearing lesion is noted in the left kidney. This demonstrates some enhancement on delayed images and corresponds to a lesion seen on multiple previous exams to include MRI from 2022 where it was characterized as potential renal neoplasm. No renal calculi or obstructive changes are noted. Some patchy decreased enhancement is noted in both kidneys which may represent a degree of underlying pyelonephritis. Correlate with laboratory values. The bladder is within normal limits. Stomach/Bowel: No obstructive or inflammatory changes of the colon are noted. Fluid is noted throughout the colon in keeping with the given clinical history of diarrhea. The appendix is within normal limits. Small bowel and stomach are unremarkable. Vascular/Lymphatic: Aortic atherosclerosis. No enlarged abdominal or pelvic lymph nodes. Reproductive: Prostate is unremarkable. Other: No abdominal wall hernia or abnormality. No abdominopelvic ascites. Musculoskeletal: Postsurgical changes in the lower lumbar spine are noted. IMPRESSION: Fluid-filled colon consistent with the given clinical history of diarrhea. Lower pole left renal lesion with some enhancement. This has been previously characterized as potential renal neoplasm on  MRI. Follow-up as clinically indicated. Patchy decreased enhancement in both kidneys which may represent an underlying degree of pyelonephritis. This is slightly more prominent on the right than the left. Electronically Signed   By: Oneil Devonshire M.D.   On: 11/27/2023 01:07        Scheduled Meds:  atorvastatin   40 mg Oral Daily   DULoxetine   60 mg Oral Daily   gabapentin   300 mg Oral TID   insulin  aspart  0-15 Units Subcutaneous TID WC   pantoprazole   40 mg Oral Daily   Continuous Infusions:   LOS: 0 days    Time spent: 35 minutes    Leatrice Chapel, MD  [facility] Pager #: [phone] 7PM-7AM contact night coverage as above
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
## 2023-11-28 NOTE — Plan of Care (Signed)
  Problem: Education: Goal: Knowledge of General Education information will improve Description: Including pain rating scale, medication(s)/side effects and non-pharmacologic comfort measures Outcome: Progressing   Problem: Clinical Measurements: Goal: Will remain free from infection Outcome: Progressing Goal: Diagnostic test results will improve Outcome: Progressing Goal: Respiratory complications will improve Outcome: Progressing Goal: Cardiovascular complication will be avoided Outcome: Progressing   Problem: Activity: Goal: Risk for activity intolerance will decrease Outcome: Progressing   Problem: Nutrition: Goal: Adequate nutrition will be maintained Outcome: Progressing   Problem: Elimination: Goal: Will not experience complications related to bowel motility Outcome: Progressing Goal: Will not experience complications related to urinary retention Outcome: Progressing   Problem: Pain Managment: Goal: General experience of comfort will improve and/or be controlled Outcome: Progressing   Problem: Safety: Goal: Ability to remain free from injury will improve Outcome: Progressing   Problem: Skin Integrity: Goal: Risk for impaired skin integrity will decrease Outcome: Progressing   Problem: Metabolic: Goal: Ability to maintain appropriate glucose levels will improve Outcome: Progressing

## 2023-11-29 ENCOUNTER — Other Ambulatory Visit (HOSPITAL_COMMUNITY): Payer: Self-pay

## 2023-11-29 DIAGNOSIS — R197 Diarrhea, unspecified: Secondary | ICD-10-CM | POA: Diagnosis not present

## 2023-11-29 DIAGNOSIS — R112 Nausea with vomiting, unspecified: Secondary | ICD-10-CM | POA: Diagnosis not present

## 2023-11-29 LAB — CBC WITH DIFFERENTIAL/PLATELET
Abs Immature Granulocytes: 0.02 K/uL (ref 0.00–0.07)
Basophils Absolute: 0 K/uL (ref 0.0–0.1)
Basophils Relative: 0 %
Eosinophils Absolute: 0.5 K/uL (ref 0.0–0.5)
Eosinophils Relative: 7 %
HCT: 36.9 % — ABNORMAL LOW (ref 39.0–52.0)
Hemoglobin: 11.9 g/dL — ABNORMAL LOW (ref 13.0–17.0)
Immature Granulocytes: 0 %
Lymphocytes Relative: 28 %
Lymphs Abs: 2.2 K/uL (ref 0.7–4.0)
MCH: 31.3 pg (ref 26.0–34.0)
MCHC: 32.2 g/dL (ref 30.0–36.0)
MCV: 97.1 fL (ref 80.0–100.0)
Monocytes Absolute: 0.5 K/uL (ref 0.1–1.0)
Monocytes Relative: 7 %
Neutro Abs: 4.4 K/uL (ref 1.7–7.7)
Neutrophils Relative %: 58 %
Platelets: 303 K/uL (ref 150–400)
RBC: 3.8 MIL/uL — ABNORMAL LOW (ref 4.22–5.81)
RDW: 13 % (ref 11.5–15.5)
WBC: 7.6 K/uL (ref 4.0–10.5)
nRBC: 0 % (ref 0.0–0.2)

## 2023-11-29 LAB — RENAL FUNCTION PANEL
Albumin: 3.4 g/dL — ABNORMAL LOW (ref 3.5–5.0)
Anion gap: 14 (ref 5–15)
BUN: 11 mg/dL (ref 8–23)
CO2: 19 mmol/L — ABNORMAL LOW (ref 22–32)
Calcium: 8.7 mg/dL — ABNORMAL LOW (ref 8.9–10.3)
Chloride: 105 mmol/L (ref 98–111)
Creatinine, Ser: 1.1 mg/dL (ref 0.61–1.24)
GFR, Estimated: >60 mL/min (ref >60)
Glucose, Bld: 283 mg/dL — ABNORMAL HIGH (ref 70–99)
Phosphorus: 2.4 mg/dL — ABNORMAL LOW (ref 2.5–4.6)
Potassium: 3.8 mmol/L (ref 3.5–5.1)
Sodium: 138 mmol/L (ref 135–145)

## 2023-11-29 LAB — MAGNESIUM: Magnesium: 1.4 mg/dL — ABNORMAL LOW (ref 1.7–2.4)

## 2023-11-29 LAB — GLUCOSE, CAPILLARY
Glucose-Capillary: 233 mg/dL — ABNORMAL HIGH (ref 70–99)
Glucose-Capillary: 176 mg/dL — ABNORMAL HIGH (ref 70–99)

## 2023-11-29 MED ORDER — METFORMIN HCL 500 MG PO TABS: 500.0000 mg | ORAL_TABLET | Freq: Two times a day (BID) | ORAL | 1 refills | Status: DC

## 2023-11-29 MED ORDER — GABAPENTIN 300 MG PO CAPS
300.0000 mg | ORAL_CAPSULE | Freq: Three times a day (TID) | ORAL | 1 refills | Status: DC
  Filled 2023-11-29: qty 90, 30d supply, fill #0

## 2023-11-29 MED ORDER — PANTOPRAZOLE SODIUM 40 MG PO TBEC
40.0000 mg | DELAYED_RELEASE_TABLET | Freq: Every day | ORAL | 1 refills | Status: DC
  Filled 2023-11-29: qty 30, 30d supply, fill #0

## 2023-11-29 NOTE — Discharge Summary (Signed)
 Physician Discharge Summary  Patient ID: David Wyatt MRN: [id number] DOB/AGE: 08/13/1960 63 y.o.  Admit date: 11/26/2023 Discharge date: 11/29/2023  Admission Diagnoses:  Discharge Diagnoses:  Principal Problem:   Nausea vomiting and diarrhea Active Problems:   Essential hypertension, benign   GERD (gastroesophageal reflux disease)   Hyperlipidemia   Uncontrolled type 2 diabetes mellitus with hyperglycemia (HCC)   Homelessness   AKI (acute kidney injury) (HCC)   Abdominal pain   Discharged Condition: stable  Hospital Course: Patient is a 63 year old male with medical history significant of angioedema of the lips, osteoarthritis of the hands and back, lumbar disc herniation, chronic back pain, grade I diastolic dysfunction, history of left kidney mass, type 2 diabetes, diabetic neuropathy, hyperlipidemia, hypertension, cocaine abuse, tobacco abuse, substance-induced mood disorder, depression, history of suicidal behavior and homelessness.  Patient presented to the emergency department with complaints of abdominal pain, nausea, emesis and diarrhea.  Patient was admitted for further workup and management.  GI symptoms were managed supportively.  Uncontrolled type 2 diabetes mellitus with hyperglycemia (HCC): - Blood sugar was uncontrolled.   - Resumed metformin . -Continued CBG monitoring with sliding scale insulin  coverage.   -Last A1c done on 09/27/2023 was 13.9%.       Essential hypertension, benign -Controlled. - Last blood pressure was 130/73.   -Amlodipine  is still on hold.       Hyperlipidemia Continue atorvastatin  40 mg p.o. daily.     GERD (gastroesophageal reflux disease) Resume pantoprazole  40 mg p.o. daily.   Leukocytosis: - Improving.  WBC 12 from 20-11. - Suspect reactive.     Homelessness Consult TOC team      Consults: None   Discharge Exam: Blood pressure (!) 147/78, pulse 78, temperature 98.8 F (37.1 C), temperature source Oral, resp. rate 16,  height 5' 8 (1.727 m), weight 72.6 kg, SpO2 97%.   Disposition: Discharge disposition: 01-Home or Self Care       Discharge Instructions     Diet - low sodium heart healthy   Complete by: As directed    Diet Carb Modified   Complete by: As directed    Increase activity slowly   Complete by: As directed       Allergies as of 11/29/2023       Reactions   Lisinopril  Anaphylaxis, Swelling, Other (See Comments)   Lip Swelling and angioedema, too        Medication List     STOP taking these medications    amLODipine  10 MG tablet Commonly known as: NORVASC    atorvastatin  40 MG tablet Commonly known as: LIPITOR   doxepin  25 MG capsule Commonly known as: SINEQUAN    hydrOXYzine  10 MG tablet Commonly known as: ATARAX    insulin  aspart 100 UNIT/ML injection Commonly known as: novoLOG    Lantus  SoloStar 100 UNIT/ML Solostar Pen Generic drug: insulin  glargine   loperamide  2 MG capsule Commonly known as: IMODIUM    nicotine  7 mg/24hr patch Commonly known as: NICODERM CQ  - dosed in mg/24 hr   ondansetron  4 MG disintegrating tablet Commonly known as: ZOFRAN -ODT       TAKE these medications    Accu-Chek Guide Me w/Device Kit Use to test blood sugar 3 times daily.   Accu-Chek Guide Test test strip Generic drug: glucose blood Use as instructed to check blood sugar three times daily   Accu-Chek Softclix Lancets lancets Use to check blood sugar three times daily as directed   DULoxetine  60 MG capsule Commonly known as: CYMBALTA  Take 1 capsule (60  mg total) by mouth daily.   gabapentin  300 MG capsule Commonly known as: NEURONTIN  Take 1 capsule (300 mg total) by mouth 3 (three) times daily.   lidocaine  5 % Commonly known as: LIDODERM  Place 1 patch onto the skin daily. Remove & Discard patch within 12 hours or as directed by MD   metFORMIN  500 MG tablet Commonly known as: GLUCOPHAGE  Take 1 tablet (500 mg total) by mouth 2 (two) times daily with a meal.    pantoprazole  40 MG tablet Commonly known as: PROTONIX  Take 1 tablet (40 mg total) by mouth daily. Start taking on: November 30, 2023       Time spent: 35 minutes.  SignedBETHA Leatrice LILLETTE Rosario 11/29/2023, 4:23 PM

## 2023-11-29 NOTE — Progress Notes (Signed)
 All questions and concerns answered by patient prior to discharge, patient will be discharged through the hoapital's front entrance.

## 2023-11-29 NOTE — Progress Notes (Signed)
 Discharge meds in a secure bag delivered to pt in room by this RN

## 2023-11-29 NOTE — Plan of Care (Signed)

## 2023-11-29 NOTE — Plan of Care (Signed)
   Problem: Nutrition: Goal: Adequate nutrition will be maintained Outcome: Adequate for Discharge

## 2023-11-29 NOTE — TOC Transition Note (Signed)
 Transition of Care [facility]) - Discharge Note   Patient Details  Name: David Wyatt MRN: [id number] Date of Birth: 23-Nov-1960  Transition of Care [facility]) CM/SW Contact:  Heather DELENA Saltness, LCSW Phone Number: 11/29/2023, 4:54 PM   Clinical Narrative:    [facility] consulted for housing resources and transportation. CSW met with pt at bedside to discuss discharge planning. Pt states I have nowhere to go. I came from the streets. CSW offered list of shelters in [place] area. Pt reports I already know all of the shelters, they have a waitlist. CSW advised that TOC cannot assist with finding housing for pts due to time it takes to apply to housing, application fees, etc. Pt then stated Well you can't help me then and made gesture with hands to leave the room. CSW gave bus pass to RN. No further TOC needs at this time.   Final next level of care: Homeless Shelter Barriers to Discharge: Barriers Resolved   Patient Goals and CMS Choice Patient states their goals for this hospitalization and ongoing recovery are:: To go to homeless shelter        Discharge Placement  Homeless shelter              Patient to be transferred to facility by: Bus Name of family member notified: Patient Patient and family notified of of transfer: 11/29/23  Discharge Plan and Services Additional resources added to the After Visit Summary for  Housing resources                DME Arranged: N/A DME Agency: NA       HH Arranged: NA HH Agency: NA        Social Drivers of Health (SDOH) Interventions SDOH Screenings   Food Insecurity: Food Insecurity Present (11/27/2023)  Housing: High Risk (11/27/2023)  Transportation Needs: Unmet Transportation Needs (11/27/2023)  Utilities: Patient Declined (11/27/2023)  Recent Concern: Utilities - At Risk (10/24/2023)  Alcohol Screen: Low Risk  (10/24/2023)  Depression (PHQ2-9): High Risk (11/19/2023)  Social Connections: Unknown (10/24/2023)  Tobacco Use: High Risk  (11/26/2023)     Readmission Risk Interventions     No data to display           Signed: Heather Saltness, MSW, LCSW Clinical Social Worker Inpatient Care Management 11/29/2023 4:57 PM

## 2023-12-01 ENCOUNTER — Other Ambulatory Visit (HOSPITAL_COMMUNITY): Payer: Self-pay

## 2023-12-01 ENCOUNTER — Telehealth: Payer: Self-pay

## 2023-12-01 NOTE — Transitions of Care (Post Inpatient/ED Visit) (Signed)
   12/01/2023  Name: Stephone Gum MRN: [id number] DOB: [birth year]  Today's TOC FU Call Status: Today's TOC FU Call Status:: Unsuccessful Call (1st Attempt) Unsuccessful Call (1st Attempt) Date: 12/01/23  Attempted to reach the patient regarding the most recent Inpatient/ED visit.  Follow Up Plan: Additional outreach attempts will be made to reach the patient to complete the Transitions of Care (Post Inpatient/ED visit) call.   I spoke to the patient's sister, Madelin, and she said the patient does not have a phone and the only way she hears from him, is if he contacts her.  She said she will let him know to call me if she hears from him,   Signature Slater Diesel, RN

## 2023-12-02 ENCOUNTER — Encounter (HOSPITAL_COMMUNITY): Payer: Self-pay

## 2023-12-02 ENCOUNTER — Other Ambulatory Visit: Payer: Self-pay

## 2023-12-02 ENCOUNTER — Emergency Department (HOSPITAL_COMMUNITY): Payer: MEDICAID

## 2023-12-02 ENCOUNTER — Telehealth: Payer: Self-pay

## 2023-12-02 ENCOUNTER — Emergency Department (HOSPITAL_COMMUNITY)
Admission: EM | Admit: 2023-12-02 | Discharge: 2023-12-02 | Disposition: A | Discharge status: Home / Self Care | Payer: MEDICAID | Attending: Emergency Medicine | Admitting: Emergency Medicine

## 2023-12-02 DIAGNOSIS — Z7984 Long term (current) use of oral hypoglycemic drugs: Secondary | ICD-10-CM | POA: Diagnosis not present

## 2023-12-02 DIAGNOSIS — I10 Essential (primary) hypertension: Secondary | ICD-10-CM | POA: Diagnosis not present

## 2023-12-02 DIAGNOSIS — R45851 Suicidal ideations: Secondary | ICD-10-CM | POA: Diagnosis not present

## 2023-12-02 DIAGNOSIS — Z558 Other problems related to education and literacy: Secondary | ICD-10-CM | POA: Insufficient documentation

## 2023-12-02 DIAGNOSIS — E1165 Type 2 diabetes mellitus with hyperglycemia: Secondary | ICD-10-CM | POA: Diagnosis present

## 2023-12-02 DIAGNOSIS — R739 Hyperglycemia, unspecified: Secondary | ICD-10-CM

## 2023-12-02 LAB — CBG MONITORING, ED
Glucose-Capillary: 367 mg/dL — ABNORMAL HIGH (ref 70–99)
Glucose-Capillary: 359 mg/dL — ABNORMAL HIGH (ref 70–99)
Glucose-Capillary: 104 mg/dL — ABNORMAL HIGH (ref 70–99)

## 2023-12-02 LAB — URINALYSIS, ROUTINE W REFLEX MICROSCOPIC
Bacteria, UA: NONE SEEN
Bilirubin Urine: NEGATIVE
Glucose, UA: >=500 mg/dL — AB
Hgb urine dipstick: NEGATIVE
Ketones, ur: NEGATIVE mg/dL
Leukocytes,Ua: NEGATIVE
Nitrite: NEGATIVE
Protein, ur: NEGATIVE mg/dL
Specific Gravity, Urine: 1.02 (ref 1.005–1.030)
pH: 5 (ref 5.0–8.0)

## 2023-12-02 LAB — SALICYLATE LEVEL: Salicylate Lvl: <7 mg/dL — ABNORMAL LOW (ref 7.0–30.0)

## 2023-12-02 LAB — CBC WITH DIFFERENTIAL/PLATELET
Abs Immature Granulocytes: 0.03 K/uL (ref 0.00–0.07)
Basophils Absolute: 0.1 K/uL (ref 0.0–0.1)
Basophils Relative: 1 %
Eosinophils Absolute: 0.2 K/uL (ref 0.0–0.5)
Eosinophils Relative: 3 %
HCT: 39.2 % (ref 39.0–52.0)
Hemoglobin: 12.4 g/dL — ABNORMAL LOW (ref 13.0–17.0)
Immature Granulocytes: 0 %
Lymphocytes Relative: 33 %
Lymphs Abs: 2.2 K/uL (ref 0.7–4.0)
MCH: 31 pg (ref 26.0–34.0)
MCHC: 31.6 g/dL (ref 30.0–36.0)
MCV: 98 fL (ref 80.0–100.0)
Monocytes Absolute: 0.5 K/uL (ref 0.1–1.0)
Monocytes Relative: 7 %
Neutro Abs: 3.8 K/uL (ref 1.7–7.7)
Neutrophils Relative %: 56 %
Platelets: 344 K/uL (ref 150–400)
RBC: 4 MIL/uL — ABNORMAL LOW (ref 4.22–5.81)
RDW: 13 % (ref 11.5–15.5)
WBC: 6.9 K/uL (ref 4.0–10.5)
nRBC: 0 % (ref 0.0–0.2)

## 2023-12-02 LAB — COMPREHENSIVE METABOLIC PANEL WITH GFR
ALT: 14 U/L (ref 0–44)
AST: 20 U/L (ref 15–41)
Albumin: 2.9 g/dL — ABNORMAL LOW (ref 3.5–5.0)
Alkaline Phosphatase: 81 U/L (ref 38–126)
Anion gap: 11 (ref 5–15)
BUN: 9 mg/dL (ref 8–23)
CO2: 18 mmol/L — ABNORMAL LOW (ref 22–32)
Calcium: 8.4 mg/dL — ABNORMAL LOW (ref 8.9–10.3)
Chloride: 107 mmol/L (ref 98–111)
Creatinine, Ser: 1.12 mg/dL (ref 0.61–1.24)
GFR, Estimated: >60 mL/min (ref >60)
Glucose, Bld: 317 mg/dL — ABNORMAL HIGH (ref 70–99)
Potassium: 3.9 mmol/L (ref 3.5–5.1)
Sodium: 136 mmol/L (ref 135–145)
Total Bilirubin: 0.7 mg/dL (ref 0.0–1.2)
Total Protein: 6.2 g/dL — ABNORMAL LOW (ref 6.5–8.1)

## 2023-12-02 LAB — BASIC METABOLIC PANEL WITH GFR
Anion gap: 10 (ref 5–15)
BUN: 10 mg/dL (ref 8–23)
CO2: 21 mmol/L — ABNORMAL LOW (ref 22–32)
Calcium: 8.3 mg/dL — ABNORMAL LOW (ref 8.9–10.3)
Chloride: 103 mmol/L (ref 98–111)
Creatinine, Ser: 1.13 mg/dL (ref 0.61–1.24)
GFR, Estimated: >60 mL/min (ref >60)
Glucose, Bld: 410 mg/dL — ABNORMAL HIGH (ref 70–99)
Potassium: 4 mmol/L (ref 3.5–5.1)
Sodium: 134 mmol/L — ABNORMAL LOW (ref 135–145)

## 2023-12-02 LAB — RAPID URINE DRUG SCREEN, HOSP PERFORMED
Amphetamines: NOT DETECTED
Barbiturates: NOT DETECTED
Benzodiazepines: NOT DETECTED
Cocaine: NOT DETECTED
Opiates: NOT DETECTED
Tetrahydrocannabinol: NOT DETECTED

## 2023-12-02 LAB — ACETAMINOPHEN LEVEL: Acetaminophen (Tylenol), Serum: <10 ug/mL — ABNORMAL LOW (ref 10–30)

## 2023-12-02 LAB — TROPONIN I (HIGH SENSITIVITY)
Troponin I (High Sensitivity): 9 ng/L (ref <18)
Troponin I (High Sensitivity): 10 ng/L (ref <18)

## 2023-12-02 LAB — ETHANOL: Alcohol, Ethyl (B): <15 mg/dL (ref <15)

## 2023-12-02 MED ORDER — METFORMIN HCL 500 MG PO TABS
500.0000 mg | ORAL_TABLET | Freq: Two times a day (BID) | ORAL | Status: DC
  Administered 2023-12-02: 500 mg via ORAL
  Filled 2023-12-02: qty 1

## 2023-12-02 MED ORDER — GABAPENTIN 300 MG PO CAPS
300.0000 mg | ORAL_CAPSULE | Freq: Three times a day (TID) | ORAL | Status: DC
  Administered 2023-12-02: 300 mg via ORAL
  Filled 2023-12-02: qty 1

## 2023-12-02 MED ORDER — DULOXETINE HCL 60 MG PO CPEP
60.0000 mg | ORAL_CAPSULE | Freq: Every day | ORAL | Status: DC
  Administered 2023-12-02: 60 mg via ORAL
  Filled 2023-12-02: qty 1

## 2023-12-02 MED ORDER — INSULIN ASPART 100 UNIT/ML IJ SOLN
0.0000 [IU] | Freq: Three times a day (TID) | INTRAMUSCULAR | Status: DC
  Administered 2023-12-02: 9 [IU] via SUBCUTANEOUS

## 2023-12-02 MED ORDER — SODIUM CHLORIDE 0.9 % IV BOLUS: 1000.0000 mL | Freq: Once | INTRAVENOUS | Status: DC

## 2023-12-02 MED ORDER — PANTOPRAZOLE SODIUM 40 MG PO TBEC
40.0000 mg | DELAYED_RELEASE_TABLET | Freq: Every day | ORAL | Status: DC
  Administered 2023-12-02: 40 mg via ORAL
  Filled 2023-12-02: qty 1

## 2023-12-02 NOTE — Telephone Encounter (Signed)
 No call placed.  Patient currently in ED

## 2023-12-02 NOTE — ED Triage Notes (Signed)
 Arrives [facility] from streets with suicidal ideations with no plan.   Generalized hopelessness.   Pt polite and appreciative of staffs assistance.

## 2023-12-02 NOTE — Progress Notes (Signed)
 CSW spoke with patient who stated he would like additional resources for housing. CSW provided patient a list of homeless shelters. CSW encouraged patient to call the shelters prior to him being discharge to see if they had any open spots due to him not having a phone. Patient states he receives social security and could pay for a hotel if needed.

## 2023-12-02 NOTE — Discharge Instructions (Addendum)
 Based on what you have shared, a list of resources for outpatient therapy and psychiatry is provided below to get you started back on treatment.  It is imperative that you follow through with treatment within 5-7 days from the day of discharge to prevent any further risk to your safety or mental well-being.  You are not limited to the list provided.  In case of an urgent crisis, you may contact the Mobile Crisis Unit with [facility] at [phone].        Outpatient Services for Therapy and Medication Management for [facility] [address], [place], [address] [phone] phone  New Patient Assessment/Therapy Walk-ins Monday -Friday 7am until slots are full.arrive at 7 am 2cd floor    New Patient Psychiatry/Medication Management Walk-ins Monday-Friday: 7am-11am until slots are full arrive at 7 am 2cd floor   For all walk-ins, we ask that you arrive by 7:30am because patient will be seen in the order of arrival.  Availability is limited; therefore, you may not be seen on the same day that you walk-in.  Our goal is to serve and meet the needs of our community to the best of our ability.   [facility] [address] [place], [place], [address] [phone] phone  [facility] [address] [place], [place], [address] [phone] phone [phone] phone ([address], [address], Anthem/Elevance, [address], [place], [address], [address], Healthy [place], [place], [place], [address], ConocoPhillips, Turner, UHC, American Financial, [place], Out of Network)  [employer], [place] [address] [place], [place], [address] [phone] phone ([place], Anthem/Elevance, Sanmina-SCI Options/Carelon, BCBS, [address], [place], [place], [place], [place], Harrah[employer] Entertainment, [place], [place], [place], [facility])  [employer] [address] [place], [place], [address] [phone] phone (Medicaid, ask about other insurance)  [facility] [address], [address] [place], [place], [address] [phone] phone [phone] fax ([address], [place] , [place], [place], [place] Health Choice, UHC, General Electric, Self-Pay)  Sarah Lempka [address] [place], [place], [address] [phone] phone ([address] Anthem/Elevance, [address], [address], [place], CSX Corporation, [place], [place], [place], Har[employer], [place], [place], [place], [facility])  [facility] - 6-8 MONTH WAIT FOR THERAPY; SOONER FOR MEDICATION MANAGEMENT [address], [address] [place], [place], [address] [phone] phone ([address] AmeriHealth [address] - [place], [address], [place], [place], Friday Health Plans, [address], BCBS Healthy [place], [place], [address], [place], [place], [place], [place], Tricare, UHC, Safeco Corporation, [place])  [facility] [address] [place], [place], [address] [phone] phone  [facility] [address], [address] [place], [place], [address] [phone] phone  [facility] [address] [place], [place], [address] [phone] phone  [employer] of the [facility] - THERAPY ONLY [address]  [place], [place], [address] [phone] phone  [facility], [place] [address], [place], [address] [phone] phone  [facility] [address] [place], [place], [address] [phone] phone [phone] fax  [facility] [address] [place], [place], [address] [phone] phone [phone] fax  [facility] [phone] [place], [place], [address] [phone] phone  Janit Griffins [address] [place], [place], [address]  [phone] phone  [facility]  (Adults Only) [address] [place], [place], [address]  [phone]  phone [phone] fax   [facility] [address]  [address], [place], [address] [phone] phone  Offers food and emergency or transitional housing to men, women, or families in need. Clients participate in programs and workshops developed to promote self-sufficiency and personal development.Call or walk in. Applications are accepted Monday, Wednesday, and Friday by appointment only. Need photo ID and proof of income.  [facility] - [facility] [address] [place], [place] [address] [phone] Population served: Adult men & women (50 years old and older, able to perform activities for daily living) Documents required: Valid ID & Social Security Card  [facility] [address] [place], [place]  [address] [phone] Population served: Families with children  [facility] [address] [place], [place]  [address] [phone] Population served: Male veterans 18+ with substance abuse/mental health issues Eligibility: By referral only  [facility] [address], [place], [place] [address] [phone] Population served: Males 18+ Documents required: Valid ID & Social Security Card  Room at [employer] [facility] [place], [employer]. [address], [place] [address] [phone] or [phone] Population served: Pregnant women with or without children  Documents required: Valid ID & Social Security Card  [facility] [address] [place], [place] [address] [phone] Population served: Men 18+, preference for disabled and/or veterans Eligibility: By referral only  [facility]) - [facility] [address] [place], [place], [place] [address] [phone] or [phone] Population served: Families with children.   If you are in need of housing through the shelter, contact the [employer] at  [phone].   [facility] ending homelessness [phone]  [phone]  [employer] [phone]  [facility] [phone]
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
## 2023-12-02 NOTE — Inpatient Diabetes Management (Signed)
 Inpatient Diabetes Program Recommendations  AACE/ADA: New Consensus Statement on Inpatient Glycemic Control (2015)  Target Ranges:  Prepandial:   less than 140 mg/dL      Peak postprandial:   less than 180 mg/dL (1-2 hours)      Critically ill patients:  140 - 180 mg/dL   Lab Results  Component Value Date   GLUCAP 359 (H) 12/02/2023   HGBA1C 13.9 (H) 09/27/2023    Review of Glycemic Control  Latest Reference Range & Units 12/02/23 07:52 12/02/23 09:17  Glucose-Capillary 70 - 99 mg/dL 632 (H) 640 (H)  (H): Data is abnormally high  Diabetes history: DM2 Outpatient Diabetes medications: Metformin  500 mg BID Current orders for Inpatient glycemic control: Novolog  0-9 units TID, Metformin  500 mg BID  Inpatient Diabetes Program Recommendations:    Please consider:  Lantus  20 units every day Novolog  0-15 units TID and 0-5 units at bedtime  Has had multiple ED visits and admissions.  Has used insulin  out patient but was homeless and could not keep insulins refrigerated.    Thank you, Wyvonna Pinal, MSN, CDCES Diabetes Coordinator Inpatient Diabetes Program [phone] (team pager from 8a-5p)

## 2023-12-02 NOTE — ED Notes (Signed)
 Social work at bedside.
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
## 2023-12-03 ENCOUNTER — Telehealth: Payer: Self-pay

## 2023-12-03 NOTE — Transitions of Care (Post Inpatient/ED Visit) (Addendum)
   12/03/2023  Name: David Wyatt MRN: [id number] DOB: 08/16/1960  Today's TOC FU Call Status: Today's TOC FU Call Status:: Unsuccessful Call (2nd Attempt) Unsuccessful Call (1st Attempt) Date: 12/01/23 Unsuccessful Call (2nd Attempt) Date: 12/03/23  Attempted to reach the patient regarding the most recent Inpatient/ED visit.  Follow Up Plan: Additional outreach attempts will be made to reach the patient to complete the Transitions of Care (Post Inpatient/ED visit) call.   I spoke to patient's sister, 12/01/2023 and she said she would give the patient a message to call me when she sees him.  Today I called  [phone]  and his brother, David Wyatt answered. John said he hasn't seen the patient and he doesn't have a number to reach him.  He then asked that I remove his name/ phone number as a contact for the patient.  Epic then updated as requested,   Signature Slater Diesel, RN

## 2023-12-04 ENCOUNTER — Other Ambulatory Visit (HOSPITAL_COMMUNITY): Payer: Self-pay

## 2023-12-04 ENCOUNTER — Encounter (HOSPITAL_COMMUNITY): Payer: Self-pay | Admitting: Emergency Medicine

## 2023-12-04 ENCOUNTER — Emergency Department (HOSPITAL_COMMUNITY)
Admission: EM | Admit: 2023-12-04 | Discharge: 2023-12-04 | Disposition: A | Discharge status: Home / Self Care | Payer: MEDICAID | Attending: Emergency Medicine | Admitting: Emergency Medicine

## 2023-12-04 ENCOUNTER — Other Ambulatory Visit: Payer: Self-pay

## 2023-12-04 ENCOUNTER — Telehealth: Payer: Self-pay

## 2023-12-04 DIAGNOSIS — R112 Nausea with vomiting, unspecified: Secondary | ICD-10-CM | POA: Insufficient documentation

## 2023-12-04 DIAGNOSIS — Z5900 Homelessness unspecified: Secondary | ICD-10-CM | POA: Diagnosis not present

## 2023-12-04 DIAGNOSIS — R197 Diarrhea, unspecified: Secondary | ICD-10-CM | POA: Diagnosis not present

## 2023-12-04 DIAGNOSIS — F32A Depression, unspecified: Secondary | ICD-10-CM | POA: Diagnosis not present

## 2023-12-04 DIAGNOSIS — D72829 Elevated white blood cell count, unspecified: Secondary | ICD-10-CM | POA: Insufficient documentation

## 2023-12-04 LAB — COMPREHENSIVE METABOLIC PANEL WITH GFR
ALT: 16 U/L (ref 0–44)
AST: 16 U/L (ref 15–41)
Albumin: 3.8 g/dL (ref 3.5–5.0)
Alkaline Phosphatase: 108 U/L (ref 38–126)
Anion gap: 13 (ref 5–15)
BUN: 19 mg/dL (ref 8–23)
CO2: 21 mmol/L — ABNORMAL LOW (ref 22–32)
Calcium: 9.1 mg/dL (ref 8.9–10.3)
Chloride: 103 mmol/L (ref 98–111)
Creatinine, Ser: 1.08 mg/dL (ref 0.61–1.24)
GFR, Estimated: >60 mL/min (ref >60)
Glucose, Bld: 404 mg/dL — ABNORMAL HIGH (ref 70–99)
Potassium: 4.3 mmol/L (ref 3.5–5.1)
Sodium: 137 mmol/L (ref 135–145)
Total Bilirubin: 0.4 mg/dL (ref 0.0–1.2)
Total Protein: 6.7 g/dL (ref 6.5–8.1)

## 2023-12-04 LAB — CBC WITH DIFFERENTIAL/PLATELET
Abs Immature Granulocytes: 0.08 K/uL — ABNORMAL HIGH (ref 0.00–0.07)
Basophils Absolute: 0.1 K/uL (ref 0.0–0.1)
Basophils Relative: 1 %
Eosinophils Absolute: 0.2 K/uL (ref 0.0–0.5)
Eosinophils Relative: 1 %
HCT: 39.9 % (ref 39.0–52.0)
Hemoglobin: 13.1 g/dL (ref 13.0–17.0)
Immature Granulocytes: 1 %
Lymphocytes Relative: 9 %
Lymphs Abs: 1.3 K/uL (ref 0.7–4.0)
MCH: 31.2 pg (ref 26.0–34.0)
MCHC: 32.8 g/dL (ref 30.0–36.0)
MCV: 95 fL (ref 80.0–100.0)
Monocytes Absolute: 0.9 K/uL (ref 0.1–1.0)
Monocytes Relative: 6 %
Neutro Abs: 12.4 K/uL — ABNORMAL HIGH (ref 1.7–7.7)
Neutrophils Relative %: 82 %
Platelets: 439 K/uL — ABNORMAL HIGH (ref 150–400)
RBC: 4.2 MIL/uL — ABNORMAL LOW (ref 4.22–5.81)
RDW: 13 % (ref 11.5–15.5)
WBC: 15 K/uL — ABNORMAL HIGH (ref 4.0–10.5)
nRBC: 0 % (ref 0.0–0.2)

## 2023-12-04 LAB — MAGNESIUM: Magnesium: 1.7 mg/dL (ref 1.7–2.4)

## 2023-12-04 LAB — LIPASE, BLOOD: Lipase: 69 U/L — ABNORMAL HIGH (ref 11–51)

## 2023-12-04 MED ORDER — SODIUM CHLORIDE 0.9 % IV BOLUS
1000.0000 mL | Freq: Once | INTRAVENOUS | Status: AC
  Administered 2023-12-04: 1000 mL via INTRAVENOUS

## 2023-12-04 MED ORDER — ONDANSETRON HCL 4 MG/2ML IJ SOLN
4.0000 mg | Freq: Once | INTRAMUSCULAR | Status: AC
  Administered 2023-12-04: 4 mg via INTRAVENOUS
  Filled 2023-12-04: qty 2

## 2023-12-04 MED ORDER — INSULIN ASPART 100 UNIT/ML IJ SOLN
10.0000 [IU] | Freq: Once | INTRAMUSCULAR | Status: AC
  Administered 2023-12-04: 10 [IU] via SUBCUTANEOUS
  Filled 2023-12-04: qty 0.1

## 2023-12-04 MED ORDER — LOPERAMIDE HCL 2 MG PO CAPS
2.0000 mg | ORAL_CAPSULE | Freq: Four times a day (QID) | ORAL | 0 refills | Status: DC | PRN
  Filled 2023-12-04: qty 12, 3d supply, fill #0

## 2023-12-04 MED ORDER — LOPERAMIDE HCL 2 MG PO CAPS
4.0000 mg | ORAL_CAPSULE | Freq: Once | ORAL | Status: AC
  Administered 2023-12-04: 4 mg via ORAL
  Filled 2023-12-04: qty 2

## 2023-12-04 NOTE — Discharge Instructions (Signed)
 Please start Imodium  for your diarrhea.  Continue to stay hydrated.  Continue your other medications.  Follow-up with your primary care doctor.  Return if any worsening or concerning symptoms

## 2023-12-04 NOTE — Transitions of Care (Post Inpatient/ED Visit) (Signed)
   12/04/2023  Name: David Wyatt MRN: [id number] DOB: 1960/10/26  Today's TOC FU Call Status:   Patient is currently in ED. The patient does not have a phone.  I spoke to his sister this week and she had not heard from him but said she would have him call me if she sees him.    Follow Up Plan: No further outreach attempts will be made at this time. We have been unable to contact the patient.  Signature Slater Diesel, RN
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
## 2023-12-05 ENCOUNTER — Encounter (HOSPITAL_COMMUNITY): Payer: Self-pay | Admitting: *Deleted

## 2023-12-05 ENCOUNTER — Encounter (HOSPITAL_COMMUNITY): Payer: Self-pay | Admitting: Emergency Medicine

## 2023-12-05 ENCOUNTER — Emergency Department (HOSPITAL_COMMUNITY)
Admission: EM | Admit: 2023-12-05 | Discharge: 2023-12-05 | Disposition: A | Discharge status: Home / Self Care | Payer: MEDICAID | Attending: Emergency Medicine | Admitting: Emergency Medicine

## 2023-12-05 ENCOUNTER — Other Ambulatory Visit: Payer: Self-pay

## 2023-12-05 ENCOUNTER — Emergency Department (HOSPITAL_COMMUNITY)
Admission: EM | Admit: 2023-12-05 | Discharge: 2023-12-06 | Disposition: A | Discharge status: Home / Self Care | Payer: MEDICAID | Source: Home / Self Care

## 2023-12-05 ENCOUNTER — Other Ambulatory Visit (HOSPITAL_COMMUNITY): Payer: Self-pay

## 2023-12-05 ENCOUNTER — Emergency Department (HOSPITAL_COMMUNITY)
Admission: EM | Admit: 2023-12-05 | Discharge: 2023-12-05 | Disposition: A | Discharge status: Home / Self Care | Payer: MEDICAID | Source: Home / Self Care | Attending: Emergency Medicine | Admitting: Emergency Medicine

## 2023-12-05 DIAGNOSIS — F32A Depression, unspecified: Secondary | ICD-10-CM | POA: Diagnosis not present

## 2023-12-05 DIAGNOSIS — I10 Essential (primary) hypertension: Secondary | ICD-10-CM | POA: Insufficient documentation

## 2023-12-05 DIAGNOSIS — Z5900 Homelessness unspecified: Secondary | ICD-10-CM | POA: Insufficient documentation

## 2023-12-05 DIAGNOSIS — R45851 Suicidal ideations: Secondary | ICD-10-CM | POA: Diagnosis not present

## 2023-12-05 DIAGNOSIS — E1140 Type 2 diabetes mellitus with diabetic neuropathy, unspecified: Secondary | ICD-10-CM | POA: Insufficient documentation

## 2023-12-05 DIAGNOSIS — F329 Major depressive disorder, single episode, unspecified: Secondary | ICD-10-CM | POA: Insufficient documentation

## 2023-12-05 DIAGNOSIS — Z7984 Long term (current) use of oral hypoglycemic drugs: Secondary | ICD-10-CM | POA: Insufficient documentation

## 2023-12-05 DIAGNOSIS — F17210 Nicotine dependence, cigarettes, uncomplicated: Secondary | ICD-10-CM | POA: Insufficient documentation

## 2023-12-05 DIAGNOSIS — Z765 Malingerer [conscious simulation]: Secondary | ICD-10-CM | POA: Diagnosis present

## 2023-12-05 LAB — CBC WITH DIFFERENTIAL/PLATELET
Abs Immature Granulocytes: 0.04 K/uL (ref 0.00–0.07)
Abs Immature Granulocytes: 0.06 K/uL (ref 0.00–0.07)
Basophils Absolute: 0.1 K/uL (ref 0.0–0.1)
Basophils Absolute: 0.1 K/uL (ref 0.0–0.1)
Basophils Relative: 1 %
Basophils Relative: 1 %
Eosinophils Absolute: 0.4 K/uL (ref 0.0–0.5)
Eosinophils Absolute: 0.5 K/uL (ref 0.0–0.5)
Eosinophils Relative: 5 %
Eosinophils Relative: 5 %
HCT: 38.7 % — ABNORMAL LOW (ref 39.0–52.0)
HCT: 42 % (ref 39.0–52.0)
Hemoglobin: 12.5 g/dL — ABNORMAL LOW (ref 13.0–17.0)
Hemoglobin: 13.7 g/dL (ref 13.0–17.0)
Immature Granulocytes: 1 %
Immature Granulocytes: 1 %
Lymphocytes Relative: 32 %
Lymphocytes Relative: 37 %
Lymphs Abs: 2.7 K/uL (ref 0.7–4.0)
Lymphs Abs: 2.9 K/uL (ref 0.7–4.0)
MCH: 30.9 pg (ref 26.0–34.0)
MCH: 31.2 pg (ref 26.0–34.0)
MCHC: 32.3 g/dL (ref 30.0–36.0)
MCHC: 32.6 g/dL (ref 30.0–36.0)
MCV: 95.6 fL (ref 80.0–100.0)
MCV: 95.7 fL (ref 80.0–100.0)
Monocytes Absolute: 0.5 K/uL (ref 0.1–1.0)
Monocytes Absolute: 0.5 K/uL (ref 0.1–1.0)
Monocytes Relative: 6 %
Monocytes Relative: 7 %
Neutro Abs: 3.7 K/uL (ref 1.7–7.7)
Neutro Abs: 5.1 K/uL (ref 1.7–7.7)
Neutrophils Relative %: 49 %
Neutrophils Relative %: 55 %
Platelets: 442 K/uL — ABNORMAL HIGH (ref 150–400)
Platelets: 469 K/uL — ABNORMAL HIGH (ref 150–400)
RBC: 4.05 MIL/uL — ABNORMAL LOW (ref 4.22–5.81)
RBC: 4.39 MIL/uL (ref 4.22–5.81)
RDW: 12.7 % (ref 11.5–15.5)
RDW: 12.9 % (ref 11.5–15.5)
WBC: 7.4 K/uL (ref 4.0–10.5)
WBC: 9.1 K/uL (ref 4.0–10.5)
nRBC: 0 % (ref 0.0–0.2)
nRBC: 0 % (ref 0.0–0.2)

## 2023-12-05 LAB — GASTROINTESTINAL PANEL BY PCR, STOOL (REPLACES STOOL CULTURE)

## 2023-12-05 LAB — URINALYSIS, W/ REFLEX TO CULTURE (INFECTION SUSPECTED)
Bacteria, UA: NONE SEEN
Bilirubin Urine: NEGATIVE
Glucose, UA: >=500 mg/dL — AB
Hgb urine dipstick: NEGATIVE
Ketones, ur: NEGATIVE mg/dL
Leukocytes,Ua: NEGATIVE
Nitrite: NEGATIVE
Protein, ur: NEGATIVE mg/dL
Specific Gravity, Urine: 1.027 (ref 1.005–1.030)
pH: 5 (ref 5.0–8.0)

## 2023-12-05 LAB — RAPID URINE DRUG SCREEN, HOSP PERFORMED
Amphetamines: NOT DETECTED
Barbiturates: NOT DETECTED
Benzodiazepines: NOT DETECTED
Cocaine: POSITIVE — AB
Opiates: NOT DETECTED
Tetrahydrocannabinol: NOT DETECTED

## 2023-12-05 LAB — COMPREHENSIVE METABOLIC PANEL WITH GFR
ALT: 17 U/L (ref 0–44)
ALT: 17 U/L (ref 0–44)
AST: 16 U/L (ref 15–41)
AST: 19 U/L (ref 15–41)
Albumin: 3 g/dL — ABNORMAL LOW (ref 3.5–5.0)
Albumin: 3.3 g/dL — ABNORMAL LOW (ref 3.5–5.0)
Alkaline Phosphatase: 92 U/L (ref 38–126)
Alkaline Phosphatase: 94 U/L (ref 38–126)
Anion gap: 12 (ref 5–15)
Anion gap: 12 (ref 5–15)
BUN: 20 mg/dL (ref 8–23)
BUN: 16 mg/dL (ref 8–23)
CO2: 21 mmol/L — ABNORMAL LOW (ref 22–32)
CO2: 24 mmol/L (ref 22–32)
Calcium: 8.7 mg/dL — ABNORMAL LOW (ref 8.9–10.3)
Calcium: 9 mg/dL (ref 8.9–10.3)
Chloride: 103 mmol/L (ref 98–111)
Chloride: 104 mmol/L (ref 98–111)
Creatinine, Ser: 1.22 mg/dL (ref 0.61–1.24)
Creatinine, Ser: 1.12 mg/dL (ref 0.61–1.24)
GFR, Estimated: >60 mL/min (ref >60)
GFR, Estimated: >60 mL/min (ref >60)
Glucose, Bld: 372 mg/dL — ABNORMAL HIGH (ref 70–99)
Glucose, Bld: 89 mg/dL (ref 70–99)
Potassium: 4.2 mmol/L (ref 3.5–5.1)
Potassium: 4.2 mmol/L (ref 3.5–5.1)
Sodium: 136 mmol/L (ref 135–145)
Sodium: 140 mmol/L (ref 135–145)
Total Bilirubin: 0.4 mg/dL (ref 0.0–1.2)
Total Bilirubin: 1 mg/dL (ref 0.0–1.2)
Total Protein: 6.4 g/dL — ABNORMAL LOW (ref 6.5–8.1)
Total Protein: 7.2 g/dL (ref 6.5–8.1)

## 2023-12-05 LAB — CBG MONITORING, ED
Glucose-Capillary: 348 mg/dL — ABNORMAL HIGH (ref 70–99)
Glucose-Capillary: 406 mg/dL — ABNORMAL HIGH (ref 70–99)
Glucose-Capillary: 386 mg/dL — ABNORMAL HIGH (ref 70–99)
Glucose-Capillary: 93 mg/dL (ref 70–99)
Glucose-Capillary: 95 mg/dL (ref 70–99)
Glucose-Capillary: 99 mg/dL (ref 70–99)
Glucose-Capillary: 164 mg/dL — ABNORMAL HIGH (ref 70–99)
Glucose-Capillary: 60 mg/dL — ABNORMAL LOW (ref 70–99)
Glucose-Capillary: 86 mg/dL (ref 70–99)
Glucose-Capillary: 73 mg/dL (ref 70–99)

## 2023-12-05 LAB — SALICYLATE LEVEL: Salicylate Lvl: <7 mg/dL — ABNORMAL LOW (ref 7.0–30.0)

## 2023-12-05 LAB — ACETAMINOPHEN LEVEL: Acetaminophen (Tylenol), Serum: <10 ug/mL — ABNORMAL LOW (ref 10–30)

## 2023-12-05 LAB — ETHANOL
Alcohol, Ethyl (B): <15 mg/dL (ref <15)
Alcohol, Ethyl (B): <15 mg/dL (ref <15)

## 2023-12-05 MED ORDER — INSULIN ASPART 100 UNIT/ML IJ SOLN: 0.0000 [IU] | Freq: Every day | INTRAMUSCULAR | Status: DC

## 2023-12-05 MED ORDER — PANTOPRAZOLE SODIUM 40 MG PO TBEC
40.0000 mg | DELAYED_RELEASE_TABLET | Freq: Every day | ORAL | Status: DC
  Administered 2023-12-05: 40 mg via ORAL
  Filled 2023-12-05: qty 1

## 2023-12-05 MED ORDER — METFORMIN HCL 500 MG PO TABS
500.0000 mg | ORAL_TABLET | Freq: Two times a day (BID) | ORAL | Status: DC
  Administered 2023-12-05: 500 mg via ORAL
  Filled 2023-12-05: qty 1

## 2023-12-05 MED ORDER — DULOXETINE HCL 60 MG PO CPEP
60.0000 mg | ORAL_CAPSULE | Freq: Every day | ORAL | Status: DC
  Administered 2023-12-05: 60 mg via ORAL
  Filled 2023-12-05: qty 1

## 2023-12-05 MED ORDER — INSULIN ASPART 100 UNIT/ML IJ SOLN
0.0000 [IU] | Freq: Three times a day (TID) | INTRAMUSCULAR | Status: DC
  Administered 2023-12-05: 15 [IU] via SUBCUTANEOUS

## 2023-12-05 MED ORDER — GABAPENTIN 300 MG PO CAPS
300.0000 mg | ORAL_CAPSULE | Freq: Three times a day (TID) | ORAL | Status: DC
  Administered 2023-12-05: 300 mg via ORAL
  Filled 2023-12-05: qty 1

## 2023-12-05 NOTE — ED Notes (Signed)
 Patient decided to stick hisself with insulin  upon arrival. Pt stated he should be placed in psych hold, and did not want to be on this earth anymore.  Moved pt to triage 6 because he is unstable for the lobby.

## 2023-12-05 NOTE — ED Notes (Signed)
 Pt cbg 60. PA notified, pt given food and soda.

## 2023-12-05 NOTE — ED Provider Notes (Signed)
 [place] EMERGENCY DEPARTMENT AT [facility] Provider Note   CSN: [id number] Arrival date & time: 12/05/23  9987     Patient presents with: Suicidal   David Wyatt is a 63 y.o. male.   The history is provided by the patient and medical records.   63 year old male with history of chronic pain, polysubstance abuse, GERD, diabetes, depression, substance-induced mood disorder, presenting to the ED with complaint of feeling bad.  Patient reports he has been feeling weak for several days now.  Has had some nausea, vomiting, diarrhea.  Seen in the ED yesterday for same with reassuring workup.  States he has also been having a lot of issue with his neuropathy, particularly of his right forearm.  States today he was feeling increasingly down and took 6 pills-- cannot tell me what medications exactly or dosages of each as they were just loose pills but reports they were his prescriptions.  He states he was attempting to hurt himself.  Questioned if this was due to current symptoms but he states it is much deeper but does not provide further details.  Denies HI/AVH currently.  He is requesting to eat/drink, did not have anything today.  Prior to Admission medications   Medication Sig Start Date End Date Taking? Authorizing Provider  Accu-Chek Softclix Lancets lancets Use to check blood sugar three times daily as directed 11/01/23   Shrivastava, Aryendra, MD  Blood Glucose Monitoring Suppl (ACCU-CHEK GUIDE ME) w/Device KIT Use to test blood sugar 3 times daily. 11/01/23   Shrivastava, Aryendra, MD  DULoxetine  (CYMBALTA ) 60 MG capsule Take 1 capsule (60 mg total) by mouth daily. 11/01/23   Shrivastava, Aryendra, MD  gabapentin  (NEURONTIN ) 300 MG capsule Take 1 capsule (300 mg total) by mouth 3 (three) times daily. 11/29/23   Rosario Eland I, MD  glucose blood (ACCU-CHEK GUIDE TEST) test strip Use as instructed to check blood sugar three times daily 11/01/23   Shrivastava, Aryendra, MD   lidocaine  (LIDODERM ) 5 % Place 1 patch onto the skin daily. Remove & Discard patch within 12 hours or as directed by MD Patient not taking: Reported on 11/03/2023 11/01/23   Shrivastava, Aryendra, MD  loperamide  (IMODIUM ) 2 MG capsule Take 1 capsule (2 mg total) by mouth 4 (four) times daily as needed for diarrhea or loose stools. 12/04/23   Towana Ozell BROCKS, MD  metFORMIN  (GLUCOPHAGE ) 500 MG tablet Take 1 tablet (500 mg total) by mouth 2 (two) times daily with a meal. 11/29/23   Rosario Eland FERNS, MD  pantoprazole  (PROTONIX ) 40 MG tablet Take 1 tablet (40 mg total) by mouth daily. 11/30/23   Rosario Eland FERNS, MD    Allergies: Lisinopril     Review of Systems  All other systems reviewed and are negative.   Updated Vital Signs BP (!) 132/93   Pulse 72   Temp 98.6 F (37 C) (Oral)   Resp 16   Ht 5' 8 (1.727 m)   Wt 72.6 kg   SpO2 98%   BMI 24.33 kg/m   Physical Exam Vitals and nursing note reviewed.  Constitutional:      Appearance: He is well-developed.  HENT:     Head: Normocephalic and atraumatic.  Eyes:     Conjunctiva/sclera: Conjunctivae normal.     Pupils: Pupils are equal, round, and reactive to light.  Cardiovascular:     Rate and Rhythm: Normal rate and regular rhythm.     Heart sounds: Normal heart sounds.  Pulmonary:  Effort: Pulmonary effort is normal.     Breath sounds: Normal breath sounds.  Abdominal:     General: Bowel sounds are normal.     Palpations: Abdomen is soft.  Musculoskeletal:        General: Normal range of motion.     Cervical back: Normal range of motion.  Skin:    General: Skin is warm and dry.  Neurological:     Mental Status: He is alert and oriented to person, place, and time.     (all labs ordered are listed, but only abnormal results are displayed) Labs Reviewed  CBC WITH DIFFERENTIAL/PLATELET - Abnormal; Notable for the following components:      Result Value   RBC 4.05 (*)    Hemoglobin 12.5 (*)    HCT 38.7 (*)     Platelets 442 (*)    All other components within normal limits  COMPREHENSIVE METABOLIC PANEL WITH GFR - Abnormal; Notable for the following components:   CO2 21 (*)    Glucose, Bld 372 (*)    Calcium  8.7 (*)    Total Protein 6.4 (*)    Albumin 3.0 (*)    All other components within normal limits  RAPID URINE DRUG SCREEN, HOSP PERFORMED - Abnormal; Notable for the following components:   Cocaine POSITIVE (*)    All other components within normal limits  SALICYLATE LEVEL - Abnormal; Notable for the following components:   Salicylate Lvl <7.0 (*)    All other components within normal limits  ACETAMINOPHEN  LEVEL - Abnormal; Notable for the following components:   Acetaminophen  (Tylenol ), Serum <10 (*)    All other components within normal limits  URINALYSIS, W/ REFLEX TO CULTURE (INFECTION SUSPECTED) - Abnormal; Notable for the following components:   Glucose, UA >=500 (*)    All other components within normal limits  CBG MONITORING, ED - Abnormal; Notable for the following components:   Glucose-Capillary 348 (*)    All other components within normal limits  ETHANOL    EKG: None  Radiology: No results found.   Procedures   Medications Ordered in the ED - No data to display                                  Medical Decision Making Amount and/or Complexity of Data Reviewed Labs: ordered. ECG/medicine tests: ordered and independent interpretation performed.   63 year old male presenting to the ED with suicidal ideation.  States he has been struggling with some generalized weakness and neuropathy issues.  Reports attempting to overdose on some pills today.  Took a total of 4 tablets, unclear what or dosages as they were loose pills.  He states he was attempting to hurt himself.  Reports he has some deep-rooted issues.  He is awake, alert, oriented here.  He is well-known to me from prior ED visits.  He appears around his baseline.  He has not currently responding to any  internal stimuli.    Labs as above--no leukocytosis.  He is hyperglycemic but no findings of DKA with normal anion gap, bicarb 21.  Negative Tylenol  and salicylate levels.  Ethanol also negative.  UDS is positive for cocaine.  While in the ED patient was observed to be taking additional medications.  He had bottles of gabapentin  and Protonix , both of which are prescribed to him.  These were confiscated with remainder of his belongings.  Medically cleared.  Will get TTS  consult.  Home meds ordered.  Final diagnoses:  Suicidal ideation    ED Discharge Orders     None          Jarold Olam HERO, PA-C 12/05/23 0547    Theadore Ozell HERO, MD 12/05/23 [phone]

## 2023-12-05 NOTE — ED Provider Triage Note (Signed)
 Emergency Medicine Provider Triage Evaluation Note  David Wyatt , a 63 y.o. male  was evaluated in triage.  Pt complains of SI with plan to overdose on insulin . Just seen in ER for same and dc after stable glucose. As I am talking to the patient, he is injecting his left arm with his insulin  pen and states he just gave himself 15U. Belongings removed from patient. Patient asking for sandwich, crackers, soda.   Review of Systems  Positive:  Negative:   Physical Exam  BP (!) 143/86 (BP Location: Right Arm)   Pulse 87   Temp 97.9 F (36.6 C)   Resp 17   Ht 5' 8 (1.727 m)   Wt 72.6 kg   SpO2 96%   BMI 24.34 kg/m  Gen:   Awake, no distress   Resp:  Normal effort  MSK:   Moves extremities without difficulty  Other:    Medical Decision Making  Medically screening exam initiated at 9:43 PM.  Appropriate orders placed.  David Wyatt was informed that the remainder of the evaluation will be completed by another provider, this initial triage assessment does not replace that evaluation, and the importance of remaining in the ED until their evaluation is complete.     David Wyatt LABOR, PA-C 12/05/23 2144

## 2023-12-05 NOTE — Discharge Instructions (Signed)
 Return for any problem.  ?

## 2023-12-05 NOTE — ED Notes (Signed)
 Notified by RN walking past pt that he was swallowing pills. Pt found to have multiple Gabapentin  bottles and a Protonix  bottle. Pt unsure which medication he took and how many. Medications confiscated from pt. Provider notified of event.

## 2023-12-05 NOTE — ED Provider Triage Note (Signed)
 Emergency Medicine Provider Triage Evaluation Note  Jacquees Gongora , a 63 y.o. male  was evaluated in triage.  Pt complains of feeling tired and weak. Took 10U of insulin  to try and end life today. Feels like his sugar is dropping.   Review of Systems  Positive:  Negative:   Physical Exam  BP (!) 130/109 (BP Location: Right Arm)   Pulse 92   Resp 16   Ht 5' 8 (1.727 m)   Wt 72.6 kg   SpO2 97%   BMI 24.34 kg/m  Gen:   Awake, no distress   Resp:  Normal effort  MSK:   Moves extremities without difficulty  Other:    Medical Decision Making  Medically screening exam initiated at 5:13 PM.  Appropriate orders placed.  [name] was informed that the remainder of the evaluation will be completed by another provider, this initial triage assessment does not replace that evaluation, and the importance of remaining in the ED until their evaluation is complete.     Beverley Leita LABOR, PA-C 12/05/23 1714

## 2023-12-05 NOTE — ED Triage Notes (Signed)
 Pt BIB [facility] c/o of right sided body pain and weakness last 63mo. Patient reports SI with attempt today. 4 unknown pills unknown dosage.   [facility] VS: CBG 408 160/94 BP 80 HR 94%RA

## 2023-12-05 NOTE — ED Notes (Signed)
 Pt presented to ED in blue paper scrubs from prior hospital visit. All belongings secured in locker. Pt able to ambulate independently to RR. Pt on monitoring equipment in hallway bed.

## 2023-12-05 NOTE — ED Notes (Signed)
 Provider made aware of pt's CBG of 348.

## 2023-12-05 NOTE — ED Triage Notes (Signed)
 This is the 3rd time the pt has  been here today  he gets discharged and then checks back in  he was just discharged  from here again

## 2023-12-05 NOTE — ED Provider Notes (Signed)
 [place] EMERGENCY DEPARTMENT AT [facility] Provider Note   CSN: [id number] Arrival date & time: 12/05/23  1631     Patient presents with: Psychiatric Evaluation   Colonel Krauser is a 63 y.o. male.   63 y.o. male  was evaluated in triage.  Pt complains of feeling tired and weak. Took 10U of insulin  to try and end life today. Feels like his sugar is dropping. Patient seen this afternoon by behavioral health and discharged.        Prior to Admission medications   Medication Sig Start Date End Date Taking? Authorizing Provider  Accu-Chek Softclix Lancets lancets Use to check blood sugar three times daily as directed 11/01/23   Shrivastava, Aryendra, MD  Blood Glucose Monitoring Suppl (ACCU-CHEK GUIDE ME) w/Device KIT Use to test blood sugar 3 times daily. 11/01/23   Shrivastava, Aryendra, MD  DULoxetine  (CYMBALTA ) 60 MG capsule Take 1 capsule (60 mg total) by mouth daily. 11/01/23   Shrivastava, Aryendra, MD  gabapentin  (NEURONTIN ) 300 MG capsule Take 1 capsule (300 mg total) by mouth 3 (three) times daily. 11/29/23   Rosario Eland I, MD  glucose blood (ACCU-CHEK GUIDE TEST) test strip Use as instructed to check blood sugar three times daily 11/01/23   Shrivastava, Aryendra, MD  lidocaine  (LIDODERM ) 5 % Place 1 patch onto the skin daily. Remove & Discard patch within 12 hours or as directed by MD Patient not taking: Reported on 12/05/2023 11/01/23   Shrivastava, Aryendra, MD  loperamide  (IMODIUM ) 2 MG capsule Take 1 capsule (2 mg total) by mouth 4 (four) times daily as needed for diarrhea or loose stools. Patient not taking: Reported on 12/05/2023 12/04/23   Towana Ozell BROCKS, MD  metFORMIN  (GLUCOPHAGE ) 500 MG tablet Take 1 tablet (500 mg total) by mouth 2 (two) times daily with a meal. Patient not taking: Reported on 12/05/2023 11/29/23   Rosario Eland I, MD  pantoprazole  (PROTONIX ) 40 MG tablet Take 1 tablet (40 mg total) by mouth daily. 11/30/23   Rosario Eland FERNS, MD     Allergies: Lisinopril     Review of Systems Level 5 caveat for psychiatric condition  Updated Vital Signs BP (!) 130/109 (BP Location: Right Arm)   Pulse 92   Temp 98.5 F (36.9 C)   Resp 16   Ht 5' 8 (1.727 m)   Wt 72.6 kg   SpO2 97%   BMI 24.34 kg/m   Physical Exam Vitals and nursing note reviewed.  Constitutional:      General: He is not in acute distress.    Appearance: He is well-developed. He is not diaphoretic.  HENT:     Head: Normocephalic and atraumatic.  Cardiovascular:     Rate and Rhythm: Normal rate and regular rhythm.     Heart sounds: Normal heart sounds.  Pulmonary:     Effort: Pulmonary effort is normal.     Breath sounds: Normal breath sounds.  Abdominal:     Palpations: Abdomen is soft.     Tenderness: There is no abdominal tenderness.  Skin:    General: Skin is warm and dry.     Findings: No erythema or rash.  Neurological:     Mental Status: He is alert and oriented to person, place, and time.  Psychiatric:        Behavior: Behavior normal.     (all labs ordered are listed, but only abnormal results are displayed) Labs Reviewed  COMPREHENSIVE METABOLIC PANEL WITH GFR - Abnormal; Notable for the following  components:      Result Value   Albumin 3.3 (*)    All other components within normal limits  CBC WITH DIFFERENTIAL/PLATELET - Abnormal; Notable for the following components:   Platelets 469 (*)    All other components within normal limits  ETHANOL  RAPID URINE DRUG SCREEN, HOSP PERFORMED  CBG MONITORING, ED  CBG MONITORING, ED  CBG MONITORING, ED    EKG: None  Radiology: No results found.   Procedures   Medications Ordered in the ED - No data to display                                  Medical Decision Making Amount and/or Complexity of Data Reviewed Labs: ordered.   63 yo male returns to the ER after behavioral health evaluation completed this afternoon. Patient states he overdosed on his insulin  with intent to  self harm. Blood sugar was monitored in the ER and is stable. Patient is eating crackers and drinking soda (after stable CBGs x 3). Discussed with Dr. Garrick [facility] NP who evaluated patient earlier today, presentation felt to be stable/unchanged from prior ER presentations and not related to acute emergency. Patient is discharged with shelter resource list.      Final diagnoses:  Malingering    ED Discharge Orders     None          Beverley Leita DELENA DEVONNA 12/05/23 RONOLD Garrick [place], MD 12/05/23 2159

## 2023-12-05 NOTE — ED Notes (Signed)
 Pt provided orange juice

## 2023-12-05 NOTE — BH Assessment (Addendum)
 This patient has been referred to [facility] telecare for his teleassessment.  A coordinator with [facility] will reach out with a time and provider to see patient.

## 2023-12-05 NOTE — ED Notes (Signed)
 Patient said he is still suicidal and his plan is to take all his medication

## 2023-12-05 NOTE — ED Triage Notes (Signed)
 The pt was just discharged from here one hour ago  he told that nurse that he just needed some place to stay
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
## 2023-12-05 NOTE — ED Notes (Addendum)
 Security escorted pt over to sort tech 2 desk. Stating He said he has a needle in his arm. After assessing the pt and determining there was no needle the pt states I took more insulin  and I wasn't suppose to This NT asked pt why did you take more insulin , did you suspect high blood sugar? Pt stops talking and stares at wall. NT Cierra escorted pt back to triage for further evaluation.

## 2023-12-05 NOTE — ED Notes (Signed)
 Pt provided with sack lunch and drink.

## 2023-12-05 NOTE — ED Notes (Signed)
 Pt walker and belongings placed in locker room.

## 2023-12-06 ENCOUNTER — Emergency Department (HOSPITAL_COMMUNITY): Admission: EM | Admit: 2023-12-06 | Payer: MEDICAID | Source: Home / Self Care

## 2023-12-06 ENCOUNTER — Encounter (HOSPITAL_COMMUNITY): Payer: Self-pay | Admitting: Pharmacy Technician

## 2023-12-06 ENCOUNTER — Other Ambulatory Visit: Payer: Self-pay

## 2023-12-06 ENCOUNTER — Ambulatory Visit (INDEPENDENT_AMBULATORY_CARE_PROVIDER_SITE_OTHER)
Admission: EM | Admit: 2023-12-06 | Discharge: 2023-12-07 | Disposition: A | Discharge status: Other Acute Inpatient Hospital | Payer: MEDICAID

## 2023-12-06 ENCOUNTER — Encounter (HOSPITAL_COMMUNITY): Payer: Self-pay

## 2023-12-06 ENCOUNTER — Emergency Department (HOSPITAL_COMMUNITY)
Admission: EM | Admit: 2023-12-06 | Discharge: 2023-12-06 | Disposition: A | Discharge status: Home / Self Care | Payer: MEDICAID | Source: Home / Self Care

## 2023-12-06 ENCOUNTER — Emergency Department (HOSPITAL_COMMUNITY)
Admission: EM | Admit: 2023-12-06 | Discharge: 2023-12-06 | Disposition: A | Discharge status: Home / Self Care | Payer: MEDICAID | Attending: Emergency Medicine | Admitting: Emergency Medicine

## 2023-12-06 DIAGNOSIS — I10 Essential (primary) hypertension: Secondary | ICD-10-CM | POA: Diagnosis not present

## 2023-12-06 DIAGNOSIS — R45851 Suicidal ideations: Secondary | ICD-10-CM | POA: Diagnosis present

## 2023-12-06 DIAGNOSIS — Z608 Other problems related to social environment: Secondary | ICD-10-CM | POA: Diagnosis not present

## 2023-12-06 DIAGNOSIS — E1165 Type 2 diabetes mellitus with hyperglycemia: Secondary | ICD-10-CM | POA: Insufficient documentation

## 2023-12-06 DIAGNOSIS — G4700 Insomnia, unspecified: Secondary | ICD-10-CM | POA: Diagnosis not present

## 2023-12-06 DIAGNOSIS — F1429 Cocaine dependence with unspecified cocaine-induced disorder: Secondary | ICD-10-CM | POA: Insufficient documentation

## 2023-12-06 DIAGNOSIS — Z5900 Homelessness unspecified: Secondary | ICD-10-CM | POA: Insufficient documentation

## 2023-12-06 DIAGNOSIS — R739 Hyperglycemia, unspecified: Secondary | ICD-10-CM | POA: Diagnosis present

## 2023-12-06 DIAGNOSIS — R4689 Other symptoms and signs involving appearance and behavior: Secondary | ICD-10-CM | POA: Diagnosis present

## 2023-12-06 DIAGNOSIS — F332 Major depressive disorder, recurrent severe without psychotic features: Secondary | ICD-10-CM | POA: Insufficient documentation

## 2023-12-06 DIAGNOSIS — Z7984 Long term (current) use of oral hypoglycemic drugs: Secondary | ICD-10-CM | POA: Diagnosis not present

## 2023-12-06 DIAGNOSIS — F32A Depression, unspecified: Secondary | ICD-10-CM | POA: Insufficient documentation

## 2023-12-06 LAB — POCT URINE DRUG SCREEN - MANUAL ENTRY (I-SCREEN)
POC Amphetamine UR: NOT DETECTED
POC Buprenorphine (BUP): NOT DETECTED
POC Cocaine UR: NOT DETECTED
POC Marijuana UR: NOT DETECTED
POC Methadone UR: NOT DETECTED
POC Methamphetamine UR: NOT DETECTED
POC Morphine: NOT DETECTED
POC Oxazepam (BZO): NOT DETECTED
POC Oxycodone UR: NOT DETECTED
POC Secobarbital (BAR): NOT DETECTED

## 2023-12-06 LAB — COMPREHENSIVE METABOLIC PANEL WITH GFR
ALT: 16 U/L (ref 0–44)
AST: 16 U/L (ref 15–41)
Albumin: 3.4 g/dL — ABNORMAL LOW (ref 3.5–5.0)
Alkaline Phosphatase: 92 U/L (ref 38–126)
Anion gap: 11 (ref 5–15)
BUN: 15 mg/dL (ref 8–23)
CO2: 24 mmol/L (ref 22–32)
Calcium: 9.1 mg/dL (ref 8.9–10.3)
Chloride: 102 mmol/L (ref 98–111)
Creatinine, Ser: 1.04 mg/dL (ref 0.61–1.24)
GFR, Estimated: >60 mL/min (ref >60)
Glucose, Bld: 267 mg/dL — ABNORMAL HIGH (ref 70–99)
Potassium: 4.2 mmol/L (ref 3.5–5.1)
Sodium: 137 mmol/L (ref 135–145)
Total Bilirubin: 0.6 mg/dL (ref 0.0–1.2)
Total Protein: 7.1 g/dL (ref 6.5–8.1)

## 2023-12-06 LAB — CBC
HCT: 43.2 % (ref 39.0–52.0)
Hemoglobin: 14 g/dL (ref 13.0–17.0)
MCH: 31 pg (ref 26.0–34.0)
MCHC: 32.4 g/dL (ref 30.0–36.0)
MCV: 95.6 fL (ref 80.0–100.0)
Platelets: 493 K/uL — ABNORMAL HIGH (ref 150–400)
RBC: 4.52 MIL/uL (ref 4.22–5.81)
RDW: 13 % (ref 11.5–15.5)
WBC: 5.8 K/uL (ref 4.0–10.5)
nRBC: 0 % (ref 0.0–0.2)

## 2023-12-06 LAB — CBG MONITORING, ED
Glucose-Capillary: 105 mg/dL — ABNORMAL HIGH (ref 70–99)
Glucose-Capillary: 129 mg/dL — ABNORMAL HIGH (ref 70–99)
Glucose-Capillary: 149 mg/dL — ABNORMAL HIGH (ref 70–99)
Glucose-Capillary: 154 mg/dL — ABNORMAL HIGH (ref 70–99)
Glucose-Capillary: 188 mg/dL — ABNORMAL HIGH (ref 70–99)
Glucose-Capillary: 254 mg/dL — ABNORMAL HIGH (ref 70–99)
Glucose-Capillary: 350 mg/dL — ABNORMAL HIGH (ref 70–99)
Glucose-Capillary: 65 mg/dL — ABNORMAL LOW (ref 70–99)

## 2023-12-06 LAB — ETHANOL: Alcohol, Ethyl (B): <15 mg/dL (ref <15)

## 2023-12-06 LAB — RAPID URINE DRUG SCREEN, HOSP PERFORMED
Amphetamines: NOT DETECTED
Barbiturates: NOT DETECTED
Benzodiazepines: NOT DETECTED
Cocaine: NOT DETECTED
Opiates: NOT DETECTED
Tetrahydrocannabinol: NOT DETECTED

## 2023-12-06 LAB — HEMOGLOBIN A1C
Hgb A1c MFr Bld: 12.4 % — ABNORMAL HIGH (ref 4.8–5.6)
Mean Plasma Glucose: 309.18 mg/dL

## 2023-12-06 MED ORDER — INSULIN ASPART 100 UNIT/ML IJ SOLN
0.0000 [IU] | Freq: Every day | INTRAMUSCULAR | Status: DC
  Administered 2023-12-06: 3 [IU] via SUBCUTANEOUS

## 2023-12-06 MED ORDER — MAGNESIUM HYDROXIDE 400 MG/5ML PO SUSP: 30.0000 mL | Freq: Every day | ORAL | Status: DC | PRN

## 2023-12-06 MED ORDER — INSULIN ASPART 100 UNIT/ML IJ SOLN
0.0000 [IU] | Freq: Three times a day (TID) | INTRAMUSCULAR | Status: DC
  Administered 2023-12-07 (×2): 8 [IU] via SUBCUTANEOUS

## 2023-12-06 MED ORDER — OLANZAPINE 5 MG PO TBDP: 5.0000 mg | ORAL_TABLET | Freq: Three times a day (TID) | ORAL | Status: DC | PRN

## 2023-12-06 MED ORDER — HYDROXYZINE HCL 25 MG PO TABS: 25.0000 mg | ORAL_TABLET | Freq: Three times a day (TID) | ORAL | Status: DC | PRN

## 2023-12-06 MED ORDER — ACETAMINOPHEN 325 MG PO TABS: 650.0000 mg | ORAL_TABLET | Freq: Four times a day (QID) | ORAL | Status: DC | PRN

## 2023-12-06 MED ORDER — ALUM & MAG HYDROXIDE-SIMETH 200-200-20 MG/5ML PO SUSP: 30.0000 mL | ORAL | Status: DC | PRN

## 2023-12-06 NOTE — ED Provider Notes (Signed)
 [facility] Continuous Assessment Admission H&P  Date: 12/06/23 Patient Name: David Wyatt MRN: [id number] Chief Complaint: I am ready to commit suicide, I am tired, I have no family...  Diagnoses:  Final diagnoses:  Suicidal ideation    HPI: 63 year old male with history of chronic pain, polysubstance abuse, GERD, diabetes, depression, substance-induced mood disorder, presenting to [facility] complaining of suicidal ideations with a plan to overdose on Insulin . States he is tired of being homeless, of not having a family.   Patient reports he has been feeling hopeless  for several days now.  Has had thoughts of killing himself for a month after he became homeless.  David Wyatt has had multiple visits to ED, malingering.  Today he presents here saying that I am trying to better myself, I am not doing drugs. States he was living with a friend who walked out of the house and patient could not stay there because I was not on the lease. He then became homeless with no support around.  He reports not using any substances. Reports all his family members passed away. Admits to going to ED frequently because I have nowhere to go. States he has been at many homeless shelters and no one wants me anymore.  He denies HI/AVH.   Patient is evaluated face to face by this clinician. He appears disheveled, anxious, restless  and tired. Alert and oriented x 4. His thought process is coherent and goal-directed. Patient does not appear to be preoccupied. He does state I am ready to commit suicide, reporting that he is tired of being homeless and not having a family around. Reports his SI are triggered by his living conditions. Reports he has been homeless for about a month and no shelter is accepting him. He reports that he is trying to better his life and has stopped using drugs. He reports that he thought about overdosing on insulin  earlier but chose to come here to seek help. He then states  I am tired of this  kind of life please help me, I heard you help people find places to live at.... Patient does not verbalize any additional concerns. He reports that his appetite is good. Sleep is not very good due to his living condition. Patient denies HI/AVH.   Provider discussed with patient about available homeless shelters in the community. Patient reported he is willing to look for a shelter but not in the middle of the night.    Patient is admitted to Observation unit with possible discharge in AM. He does not meet criteria for inpatient.    Total Time spent with patient: 1 hour  Musculoskeletal  Strength & Muscle Tone: within normal limits Gait & Station: normal Patient leans: N/A  Psychiatric Specialty Exam  Presentation General Appearance:  Disheveled  Eye Contact: Fair  Speech: Clear and Coherent  Speech Volume: Normal  Handedness: Right   Mood and Affect  Mood: Depressed; Anxious  Affect: Tearful   Thought Process  Thought Processes: Coherent  Descriptions of Associations:Intact  Orientation:Full (Time, Place and Person)  Thought Content:WDL  Diagnosis of Schizophrenia or Schizoaffective disorder in past: No   Hallucinations:Hallucinations: None  Ideas of Reference:None  Suicidal Thoughts:Suicidal Thoughts: Yes, Passive  Homicidal Thoughts:Homicidal Thoughts: No   Sensorium  Memory: Immediate Fair; Recent Fair; Remote Fair  Judgment: Fair  Insight: Fair   Art therapist  Concentration: Fair  Attention Span: Fair  Recall: [employer] of Knowledge: Fair  Language: Fair   Psychomotor Activity  Psychomotor  Activity:No data recorded  Assets  Assets: Communication Skills; Desire for Improvement   Sleep  Sleep: Sleep: Poor Number of Hours of Sleep: 4   Nutritional Assessment (For OBS and FBC admissions only) Has the patient had a weight loss or gain of 10 pounds or more in the last 3 months?: No Has the patient had a  decrease in food intake/or appetite?: No Does the patient have dental problems?: No Does the patient have eating habits or behaviors that may be indicators of an eating disorder including binging or inducing vomiting?: No Has the patient recently lost weight without trying?: 0 Has the patient been eating poorly because of a decreased appetite?: 0 Malnutrition Screening Tool Score: 0    Physical Exam ROS  Blood pressure 134/88, pulse 78, temperature 98.2 F (36.8 C), temperature source Oral, resp. rate 18, SpO2 98%. There is no height or weight on file to calculate BMI.  Past Psychiatric History: Substance    Is the patient at risk to self? No  Has the patient been a risk to self in the past 6 months? No .    Has the patient been a risk to self within the distant past? No   Is the patient a risk to others? No   Has the patient been a risk to others in the past 6 months? No   Has the patient been a risk to others within the distant past? No   Past Medical History: DM  Family History: NA  Social History: Homeless  Last Labs:  Admission on 12/06/2023, Discharged on 12/06/2023  Component Date Value Ref Range Status   Glucose-Capillary 12/06/2023 350 (H)  70 - 99 mg/dL Final   Glucose reference range applies only to samples taken after fasting for at least 8 hours.   Comment 1 12/06/2023 Notify RN   Final   Glucose-Capillary 12/06/2023 154 (H)  70 - 99 mg/dL Final   Glucose reference range applies only to samples taken after fasting for at least 8 hours.   Glucose-Capillary 12/06/2023 65 (L)  70 - 99 mg/dL Final   Glucose reference range applies only to samples taken after fasting for at least 8 hours.   Comment 1 12/06/2023 Notify RN   Final   Glucose-Capillary 12/06/2023 149 (H)  70 - 99 mg/dL Final   Glucose reference range applies only to samples taken after fasting for at least 8 hours.   Comment 1 12/06/2023 Notify RN   Final   Glucose-Capillary 12/06/2023 188 (H)  70 - 99  mg/dL Final   Glucose reference range applies only to samples taken after fasting for at least 8 hours.   Comment 1 12/06/2023 Notify RN   Final  Admission on 12/06/2023, Discharged on 12/06/2023  Component Date Value Ref Range Status   Sodium 12/06/2023 137  135 - 145 mmol/L Final   Potassium 12/06/2023 4.2  3.5 - 5.1 mmol/L Final   Chloride 12/06/2023 102  98 - 111 mmol/L Final   CO2 12/06/2023 24  22 - 32 mmol/L Final   Glucose, Bld 12/06/2023 267 (H)  70 - 99 mg/dL Final   Glucose reference range applies only to samples taken after fasting for at least 8 hours.   BUN 12/06/2023 15  8 - 23 mg/dL Final   Creatinine, Ser 12/06/2023 1.04  0.61 - 1.24 mg/dL Final   Calcium  12/06/2023 9.1  8.9 - 10.3 mg/dL Final   Total Protein 90/72/7974 7.1  6.5 - 8.1 g/dL Final  Albumin 12/06/2023 3.4 (L)  3.5 - 5.0 g/dL Final   AST 90/72/7974 16  15 - 41 U/L Final   ALT 12/06/2023 16  0 - 44 U/L Final   Alkaline Phosphatase 12/06/2023 92  38 - 126 U/L Final   Total Bilirubin 12/06/2023 0.6  0.0 - 1.2 mg/dL Final   GFR, Estimated 12/06/2023 >60  >60 mL/min Final   Comment: (NOTE) Calculated using the CKD-EPI Creatinine Equation (2021)    Anion gap 12/06/2023 11  5 - 15 Final   Performed at [facility], [address], [place], [place] [address]   Alcohol, Ethyl (B) 12/06/2023 <15  <15 mg/dL Final   Comment: (NOTE) For medical purposes only. Performed at [facility], [address] [place], [place] [address]    WBC 12/06/2023 5.8  4.0 - 10.5 K/uL Final   RBC 12/06/2023 4.52  4.22 - 5.81 MIL/uL Final   Hemoglobin 12/06/2023 14.0  13.0 - 17.0 g/dL Final   HCT 90/72/7974 43.2  39.0 - 52.0 % Final   MCV 12/06/2023 95.6  80.0 - 100.0 fL Final   MCH 12/06/2023 31.0  26.0 - 34.0 pg Final   MCHC 12/06/2023 32.4  30.0 - 36.0 g/dL Final   RDW 90/72/7974 13.0  11.5 - 15.5 % Final   Platelets 12/06/2023 493 (H)  150 - 400 K/uL Final   nRBC 12/06/2023 0.0  0.0 - 0.2 % Final   Performed at  [facility], [address] [place], [place] [address]   Opiates 12/06/2023 NONE DETECTED  NONE DETECTED Final   Cocaine 12/06/2023 NONE DETECTED  NONE DETECTED Final   Benzodiazepines 12/06/2023 NONE DETECTED  NONE DETECTED Final   Amphetamines 12/06/2023 NONE DETECTED  NONE DETECTED Final   Tetrahydrocannabinol 12/06/2023 NONE DETECTED  NONE DETECTED Final   Barbiturates 12/06/2023 NONE DETECTED  NONE DETECTED Final   Comment: (NOTE) DRUG SCREEN FOR MEDICAL PURPOSES ONLY.  IF CONFIRMATION IS NEEDED FOR ANY PURPOSE, NOTIFY LAB WITHIN 5 DAYS.  LOWEST DETECTABLE LIMITS FOR URINE DRUG SCREEN Drug Class                     Cutoff (ng/mL) Amphetamine and metabolites    1000 Barbiturate and metabolites    200 Benzodiazepine                 200 Opiates and metabolites        300 Cocaine and metabolites        300 THC                            50 Performed at [facility], [address], [place], [place] [address]    Glucose-Capillary 12/06/2023 254 (H)  70 - 99 mg/dL Final   Glucose reference range applies only to samples taken after fasting for at least 8 hours.  Admission on 12/05/2023, Discharged on 12/06/2023  Component Date Value Ref Range Status   Glucose-Capillary 12/05/2023 164 (H)  70 - 99 mg/dL Final   Glucose reference range applies only to samples taken after fasting for at least 8 hours.   Glucose-Capillary 12/05/2023 60 (L)  70 - 99 mg/dL Final   Glucose reference range applies only to samples taken after fasting for at least 8 hours.   Glucose-Capillary 12/05/2023 86  70 - 99 mg/dL Final   Glucose reference range applies only to samples taken after fasting  for at least 8 hours.   Glucose-Capillary 12/05/2023 73  70 - 99 mg/dL Final   Glucose reference range applies only to samples taken after fasting for at least 8 hours.   Glucose-Capillary 12/06/2023 105 (H)  70 - 99 mg/dL Final   Glucose reference range applies only to samples taken after fasting for  at least 8 hours.   Glucose-Capillary 12/06/2023 129 (H)  70 - 99 mg/dL Final   Glucose reference range applies only to samples taken after fasting for at least 8 hours.  Admission on 12/05/2023, Discharged on 12/05/2023  Component Date Value Ref Range Status   Glucose-Capillary 12/05/2023 93  70 - 99 mg/dL Final   Glucose reference range applies only to samples taken after fasting for at least 8 hours.   Glucose-Capillary 12/05/2023 99  70 - 99 mg/dL Final   Glucose reference range applies only to samples taken after fasting for at least 8 hours.   Sodium 12/05/2023 140  135 - 145 mmol/L Final   Potassium 12/05/2023 4.2  3.5 - 5.1 mmol/L Final   Chloride 12/05/2023 104  98 - 111 mmol/L Final   CO2 12/05/2023 24  22 - 32 mmol/L Final   Glucose, Bld 12/05/2023 89  70 - 99 mg/dL Final   Glucose reference range applies only to samples taken after fasting for at least 8 hours.   BUN 12/05/2023 16  8 - 23 mg/dL Final   Creatinine, Ser 12/05/2023 1.12  0.61 - 1.24 mg/dL Final   Calcium  12/05/2023 9.0  8.9 - 10.3 mg/dL Final   Total Protein 90/73/7974 7.2  6.5 - 8.1 g/dL Final   Albumin 90/73/7974 3.3 (L)  3.5 - 5.0 g/dL Final   AST 90/73/7974 19  15 - 41 U/L Final   ALT 12/05/2023 17  0 - 44 U/L Final   Alkaline Phosphatase 12/05/2023 94  38 - 126 U/L Final   Total Bilirubin 12/05/2023 1.0  0.0 - 1.2 mg/dL Final   GFR, Estimated 12/05/2023 >60  >60 mL/min Final   Comment: (NOTE) Calculated using the CKD-EPI Creatinine Equation (2021)    Anion gap 12/05/2023 12  5 - 15 Final   Performed at [facility], [address], [place], [place] [address]   Alcohol, Ethyl (B) 12/05/2023 <15  <15 mg/dL Final   Comment: (NOTE) For medical purposes only. Performed at [facility], [address] [place], [place] [address]    WBC 12/05/2023 9.1  4.0 - 10.5 K/uL Final   RBC 12/05/2023 4.39  4.22 - 5.81 MIL/uL Final   Hemoglobin 12/05/2023 13.7  13.0 - 17.0 g/dL Final   HCT 90/73/7974  42.0  39.0 - 52.0 % Final   MCV 12/05/2023 95.7  80.0 - 100.0 fL Final   MCH 12/05/2023 31.2  26.0 - 34.0 pg Final   MCHC 12/05/2023 32.6  30.0 - 36.0 g/dL Final   RDW 90/73/7974 12.7  11.5 - 15.5 % Final   Platelets 12/05/2023 469 (H)  150 - 400 K/uL Final   nRBC 12/05/2023 0.0  0.0 - 0.2 % Final   Neutrophils Relative % 12/05/2023 55  % Final   Neutro Abs 12/05/2023 5.1  1.7 - 7.7 K/uL Final   Lymphocytes Relative 12/05/2023 32  % Final   Lymphs Abs 12/05/2023 2.9  0.7 - 4.0 K/uL Final   Monocytes Relative 12/05/2023 6  % Final   Monocytes Absolute 12/05/2023 0.5  0.1 - 1.0 K/uL Final   Eosinophils Relative  12/05/2023 5  % Final   Eosinophils Absolute 12/05/2023 0.5  0.0 - 0.5 K/uL Final   Basophils Relative 12/05/2023 1  % Final   Basophils Absolute 12/05/2023 0.1  0.0 - 0.1 K/uL Final   Immature Granulocytes 12/05/2023 1  % Final   Abs Immature Granulocytes 12/05/2023 0.06  0.00 - 0.07 K/uL Final   Performed at [facility], [address] [place], [place] [address]   Glucose-Capillary 12/05/2023 95  70 - 99 mg/dL Final   Glucose reference range applies only to samples taken after fasting for at least 8 hours.  Admission on 12/05/2023, Discharged on 12/05/2023  Component Date Value Ref Range Status   WBC 12/05/2023 7.4  4.0 - 10.5 K/uL Final   RBC 12/05/2023 4.05 (L)  4.22 - 5.81 MIL/uL Final   Hemoglobin 12/05/2023 12.5 (L)  13.0 - 17.0 g/dL Final   HCT 90/73/7974 38.7 (L)  39.0 - 52.0 % Final   MCV 12/05/2023 95.6  80.0 - 100.0 fL Final   MCH 12/05/2023 30.9  26.0 - 34.0 pg Final   MCHC 12/05/2023 32.3  30.0 - 36.0 g/dL Final   RDW 90/73/7974 12.9  11.5 - 15.5 % Final   Platelets 12/05/2023 442 (H)  150 - 400 K/uL Final   nRBC 12/05/2023 0.0  0.0 - 0.2 % Final   Neutrophils Relative % 12/05/2023 49  % Final   Neutro Abs 12/05/2023 3.7  1.7 - 7.7 K/uL Final   Lymphocytes Relative 12/05/2023 37  % Final   Lymphs Abs 12/05/2023 2.7  0.7 - 4.0 K/uL Final   Monocytes  Relative 12/05/2023 7  % Final   Monocytes Absolute 12/05/2023 0.5  0.1 - 1.0 K/uL Final   Eosinophils Relative 12/05/2023 5  % Final   Eosinophils Absolute 12/05/2023 0.4  0.0 - 0.5 K/uL Final   Basophils Relative 12/05/2023 1  % Final   Basophils Absolute 12/05/2023 0.1  0.0 - 0.1 K/uL Final   Immature Granulocytes 12/05/2023 1  % Final   Abs Immature Granulocytes 12/05/2023 0.04  0.00 - 0.07 K/uL Final   Performed at [facility], [address], [place], [place] [address]   Sodium 12/05/2023 136  135 - 145 mmol/L Final   Potassium 12/05/2023 4.2  3.5 - 5.1 mmol/L Final   Chloride 12/05/2023 103  98 - 111 mmol/L Final   CO2 12/05/2023 21 (L)  22 - 32 mmol/L Final   Glucose, Bld 12/05/2023 372 (H)  70 - 99 mg/dL Final   Glucose reference range applies only to samples taken after fasting for at least 8 hours.   BUN 12/05/2023 20  8 - 23 mg/dL Final   Creatinine, Ser 12/05/2023 1.22  0.61 - 1.24 mg/dL Final   Calcium  12/05/2023 8.7 (L)  8.9 - 10.3 mg/dL Final   Total Protein 90/73/7974 6.4 (L)  6.5 - 8.1 g/dL Final   Albumin 90/73/7974 3.0 (L)  3.5 - 5.0 g/dL Final   AST 90/73/7974 16  15 - 41 U/L Final   ALT 12/05/2023 17  0 - 44 U/L Final   Alkaline Phosphatase 12/05/2023 92  38 - 126 U/L Final   Total Bilirubin 12/05/2023 0.4  0.0 - 1.2 mg/dL Final   GFR, Estimated 12/05/2023 >60  >60 mL/min Final   Comment: (NOTE) Calculated using the CKD-EPI Creatinine Equation (2021)    Anion gap 12/05/2023 12  5 - 15 Final   Performed at [facility], [address] [place], [place] [address]   Alcohol, Ethyl (B) 12/05/2023 <15  <15 mg/dL Final   Comment: (NOTE) For medical purposes only. Performed at [facility], [address], [place], [place] [address]    Opiates 12/05/2023 NONE DETECTED  NONE DETECTED Final   Cocaine 12/05/2023 POSITIVE (A)  NONE DETECTED Final   Benzodiazepines 12/05/2023 NONE DETECTED  NONE DETECTED Final   Amphetamines 12/05/2023 NONE DETECTED   NONE DETECTED Final   Tetrahydrocannabinol 12/05/2023 NONE DETECTED  NONE DETECTED Final   Barbiturates 12/05/2023 NONE DETECTED  NONE DETECTED Final   Comment: (NOTE) DRUG SCREEN FOR MEDICAL PURPOSES ONLY.  IF CONFIRMATION IS NEEDED FOR ANY PURPOSE, NOTIFY LAB WITHIN 5 DAYS.  LOWEST DETECTABLE LIMITS FOR URINE DRUG SCREEN Drug Class                     Cutoff (ng/mL) Amphetamine and metabolites    1000 Barbiturate and metabolites    200 Benzodiazepine                 200 Opiates and metabolites        300 Cocaine and metabolites        300 THC                            50 Performed at [facility], [address], [place], [place] [address]    Salicylate Lvl 12/05/2023 <7.0 (L)  7.0 - 30.0 mg/dL Final   Performed at [facility], [address] [place], [place] [address]   Acetaminophen  (Tylenol ), Serum 12/05/2023 <10 (L)  10 - 30 ug/mL Final   Comment: (NOTE) Therapeutic concentrations vary significantly. A range of 10-30 ug/mL  may be an effective concentration for many patients. However, some  are best treated at concentrations outside of this range. Acetaminophen  concentrations >150 ug/mL at 4 hours after ingestion  and >50 ug/mL at 12 hours after ingestion are often associated with  toxic reactions.  Performed at [facility], [address] [place], [place] [address]    Specimen Source 12/05/2023 URINE, CLEAN CATCH   Final   Color, Urine 12/05/2023 YELLOW  YELLOW Final   APPearance 12/05/2023 CLEAR  CLEAR Final   Specific Gravity, Urine 12/05/2023 1.027  1.005 - 1.030 Final   pH 12/05/2023 5.0  5.0 - 8.0 Final   Glucose, UA 12/05/2023 >=500 (A)  NEGATIVE mg/dL Final   Hgb urine dipstick 12/05/2023 NEGATIVE  NEGATIVE Final   Bilirubin Urine 12/05/2023 NEGATIVE  NEGATIVE Final   Ketones, ur 12/05/2023 NEGATIVE  NEGATIVE mg/dL Final   Protein, ur 90/73/7974 NEGATIVE  NEGATIVE mg/dL Final   Nitrite 90/73/7974 NEGATIVE  NEGATIVE Final    Leukocytes,Ua 12/05/2023 NEGATIVE  NEGATIVE Final   RBC / HPF 12/05/2023 0-5  0 - 5 RBC/hpf Final   WBC, UA 12/05/2023 0-5  0 - 5 WBC/hpf Final   Comment:        Reflex urine culture not performed if WBC <=10, OR if Squamous epithelial cells >5. If Squamous epithelial cells >5 suggest recollection.    Bacteria, UA 12/05/2023 NONE SEEN  NONE SEEN Final   Squamous Epithelial / HPF 12/05/2023 0-5  0 - 5 /HPF Final   Performed at [facility], [address], [place], [place] [address]   Glucose-Capillary 12/05/2023 348 (H)  70 - 99 mg/dL Final   Glucose reference range applies only to  samples taken after fasting for at least 8 hours.   Glucose-Capillary 12/05/2023 406 (H)  70 - 99 mg/dL Final   Glucose reference range applies only to samples taken after fasting for at least 8 hours.   Glucose-Capillary 12/05/2023 386 (H)  70 - 99 mg/dL Final   Glucose reference range applies only to samples taken after fasting for at least 8 hours.  Admission on 12/04/2023, Discharged on 12/04/2023  Component Date Value Ref Range Status   Sodium 12/04/2023 137  135 - 145 mmol/L Final   Potassium 12/04/2023 4.3  3.5 - 5.1 mmol/L Final   Chloride 12/04/2023 103  98 - 111 mmol/L Final   CO2 12/04/2023 21 (L)  22 - 32 mmol/L Final   Glucose, Bld 12/04/2023 404 (H)  70 - 99 mg/dL Final   Glucose reference range applies only to samples taken after fasting for at least 8 hours.   BUN 12/04/2023 19  8 - 23 mg/dL Final   Creatinine, Ser 12/04/2023 1.08  0.61 - 1.24 mg/dL Final   Calcium  12/04/2023 9.1  8.9 - 10.3 mg/dL Final   Total Protein 90/74/7974 6.7  6.5 - 8.1 g/dL Final   Albumin 90/74/7974 3.8  3.5 - 5.0 g/dL Final   AST 90/74/7974 16  15 - 41 U/L Final   ALT 12/04/2023 16  0 - 44 U/L Final   Alkaline Phosphatase 12/04/2023 108  38 - 126 U/L Final   Total Bilirubin 12/04/2023 0.4  0.0 - 1.2 mg/dL Final   GFR, Estimated 12/04/2023 >60  >60 mL/min Final   Comment: (NOTE) Calculated using the  CKD-EPI Creatinine Equation (2021)    Anion gap 12/04/2023 13  5 - 15 Final   Performed at [facility], [address] [place], [place] [address]   Lipase 12/04/2023 69 (H)  11 - 51 U/L Final   Performed at [facility], [address], [place], [place] [address]   WBC 12/04/2023 15.0 (H)  4.0 - 10.5 K/uL Final   RBC 12/04/2023 4.20 (L)  4.22 - 5.81 MIL/uL Final   Hemoglobin 12/04/2023 13.1  13.0 - 17.0 g/dL Final   HCT 90/74/7974 39.9  39.0 - 52.0 % Final   MCV 12/04/2023 95.0  80.0 - 100.0 fL Final   MCH 12/04/2023 31.2  26.0 - 34.0 pg Final   MCHC 12/04/2023 32.8  30.0 - 36.0 g/dL Final   RDW 90/74/7974 13.0  11.5 - 15.5 % Final   Platelets 12/04/2023 439 (H)  150 - 400 K/uL Final   nRBC 12/04/2023 0.0  0.0 - 0.2 % Final   Neutrophils Relative % 12/04/2023 82  % Final   Neutro Abs 12/04/2023 12.4 (H)  1.7 - 7.7 K/uL Final   Lymphocytes Relative 12/04/2023 9  % Final   Lymphs Abs 12/04/2023 1.3  0.7 - 4.0 K/uL Final   Monocytes Relative 12/04/2023 6  % Final   Monocytes Absolute 12/04/2023 0.9  0.1 - 1.0 K/uL Final   Eosinophils Relative 12/04/2023 1  % Final   Eosinophils Absolute 12/04/2023 0.2  0.0 - 0.5 K/uL Final   Basophils Relative 12/04/2023 1  % Final   Basophils Absolute 12/04/2023 0.1  0.0 - 0.1 K/uL Final   Immature Granulocytes 12/04/2023 1  % Final   Abs Immature Granulocytes 12/04/2023 0.08 (H)  0.00 - 0.07 K/uL Final   Performed at [facility], [address] [place], [place] [address]   Magnesium  12/04/2023 1.7  1.7 -  2.4 mg/dL Final   Performed at [facility], [address], [place], [place] [address]   Campylobacter species 12/04/2023 NOT DETECTED  NOT DETECTED Final   Plesimonas shigelloides 12/04/2023 NOT DETECTED  NOT DETECTED Final   Salmonella species 12/04/2023 NOT DETECTED  NOT DETECTED Final   Yersinia enterocolitica 12/04/2023 NOT DETECTED  NOT DETECTED Final   Vibrio  species 12/04/2023 NOT DETECTED  NOT DETECTED Final   Vibrio cholerae 12/04/2023 NOT DETECTED  NOT DETECTED Final   Enteroaggregative E coli (EAEC) 12/04/2023 NOT DETECTED  NOT DETECTED Final   Enteropathogenic E coli (EPEC) 12/04/2023 NOT DETECTED  NOT DETECTED Final   Enterotoxigenic E coli (ETEC) 12/04/2023 NOT DETECTED  NOT DETECTED Final   Shiga like toxin producing E coli * 12/04/2023 NOT DETECTED  NOT DETECTED Final   Shigella/Enteroinvasive E coli (EI* 12/04/2023 NOT DETECTED  NOT DETECTED Final   Cryptosporidium 12/04/2023 NOT DETECTED  NOT DETECTED Final   Cyclospora cayetanensis 12/04/2023 NOT DETECTED  NOT DETECTED Final   Entamoeba histolytica 12/04/2023 NOT DETECTED  NOT DETECTED Final   Giardia lamblia 12/04/2023 NOT DETECTED  NOT DETECTED Final   Adenovirus F40/41 12/04/2023 NOT DETECTED  NOT DETECTED Final   Astrovirus 12/04/2023 NOT DETECTED  NOT DETECTED Final   Norovirus GI/GII 12/04/2023 NOT DETECTED  NOT DETECTED Final   Rotavirus A 12/04/2023 NOT DETECTED  NOT DETECTED Final   Sapovirus (I, II, IV, and V) 12/04/2023 NOT DETECTED  NOT DETECTED Final   Performed at [facility], [address] [place], [place] [address]  Admission on 12/02/2023, Discharged on 12/02/2023  Component Date Value Ref Range Status   WBC 12/02/2023 6.9  4.0 - 10.5 K/uL Final   RBC 12/02/2023 4.00 (L)  4.22 - 5.81 MIL/uL Final   Hemoglobin 12/02/2023 12.4 (L)  13.0 - 17.0 g/dL Final   HCT 90/76/7974 39.2  39.0 - 52.0 % Final   MCV 12/02/2023 98.0  80.0 - 100.0 fL Final   MCH 12/02/2023 31.0  26.0 - 34.0 pg Final   MCHC 12/02/2023 31.6  30.0 - 36.0 g/dL Final   RDW 90/76/7974 13.0  11.5 - 15.5 % Final   Platelets 12/02/2023 344  150 - 400 K/uL Final   nRBC 12/02/2023 0.0  0.0 - 0.2 % Final   Neutrophils Relative % 12/02/2023 56  % Final   Neutro Abs 12/02/2023 3.8  1.7 - 7.7 K/uL Final   Lymphocytes Relative 12/02/2023 33  % Final   Lymphs Abs 12/02/2023 2.2  0.7 - 4.0 K/uL  Final   Monocytes Relative 12/02/2023 7  % Final   Monocytes Absolute 12/02/2023 0.5  0.1 - 1.0 K/uL Final   Eosinophils Relative 12/02/2023 3  % Final   Eosinophils Absolute 12/02/2023 0.2  0.0 - 0.5 K/uL Final   Basophils Relative 12/02/2023 1  % Final   Basophils Absolute 12/02/2023 0.1  0.0 - 0.1 K/uL Final   Immature Granulocytes 12/02/2023 0  % Final   Abs Immature Granulocytes 12/02/2023 0.03  0.00 - 0.07 K/uL Final   Performed at [facility], [address], [place], [place] [address]   Sodium 12/02/2023 136  135 - 145 mmol/L Final   Potassium 12/02/2023 3.9  3.5 - 5.1 mmol/L Final   Chloride 12/02/2023 107  98 - 111 mmol/L Final   CO2 12/02/2023 18 (L)  22 - 32 mmol/L Final   Glucose, Bld 12/02/2023 317 (H)  70 - 99 mg/dL Final   Glucose reference  range applies only to samples taken after fasting for at least 8 hours.   BUN 12/02/2023 9  8 - 23 mg/dL Final   Creatinine, Ser 12/02/2023 1.12  0.61 - 1.24 mg/dL Final   Calcium  12/02/2023 8.4 (L)  8.9 - 10.3 mg/dL Final   Total Protein 90/76/7974 6.2 (L)  6.5 - 8.1 g/dL Final   Albumin 90/76/7974 2.9 (L)  3.5 - 5.0 g/dL Final   AST 90/76/7974 20  15 - 41 U/L Final   ALT 12/02/2023 14  0 - 44 U/L Final   Alkaline Phosphatase 12/02/2023 81  38 - 126 U/L Final   Total Bilirubin 12/02/2023 0.7  0.0 - 1.2 mg/dL Final   GFR, Estimated 12/02/2023 >60  >60 mL/min Final   Comment: (NOTE) Calculated using the CKD-EPI Creatinine Equation (2021)    Anion gap 12/02/2023 11  5 - 15 Final   Performed at [facility], [address] [place], [place] [address]   Alcohol, Ethyl (B) 12/02/2023 <15  <15 mg/dL Final   Comment: (NOTE) For medical purposes only. Performed at [facility], [address] [place], [place] [address]    Opiates 12/02/2023 NONE DETECTED  NONE DETECTED Final   Cocaine 12/02/2023 NONE DETECTED  NONE DETECTED Final   Benzodiazepines 12/02/2023 NONE DETECTED  NONE DETECTED Final   Amphetamines  12/02/2023 NONE DETECTED  NONE DETECTED Final   Tetrahydrocannabinol 12/02/2023 NONE DETECTED  NONE DETECTED Final   Barbiturates 12/02/2023 NONE DETECTED  NONE DETECTED Final   Comment: (NOTE) DRUG SCREEN FOR MEDICAL PURPOSES ONLY.  IF CONFIRMATION IS NEEDED FOR ANY PURPOSE, NOTIFY LAB WITHIN 5 DAYS.  LOWEST DETECTABLE LIMITS FOR URINE DRUG SCREEN Drug Class                     Cutoff (ng/mL) Amphetamine and metabolites    1000 Barbiturate and metabolites    200 Benzodiazepine                 200 Opiates and metabolites        300 Cocaine and metabolites        300 THC                            50 Performed at [facility], [address] [place], [place] [address]    Color, Urine 12/02/2023 YELLOW  YELLOW Final   APPearance 12/02/2023 CLEAR  CLEAR Final   Specific Gravity, Urine 12/02/2023 1.020  1.005 - 1.030 Final   pH 12/02/2023 5.0  5.0 - 8.0 Final   Glucose, UA 12/02/2023 >=500 (A)  NEGATIVE mg/dL Final   Hgb urine dipstick 12/02/2023 NEGATIVE  NEGATIVE Final   Bilirubin Urine 12/02/2023 NEGATIVE  NEGATIVE Final   Ketones, ur 12/02/2023 NEGATIVE  NEGATIVE mg/dL Final   Protein, ur 90/76/7974 NEGATIVE  NEGATIVE mg/dL Final   Nitrite 90/76/7974 NEGATIVE  NEGATIVE Final   Leukocytes,Ua 12/02/2023 NEGATIVE  NEGATIVE Final   RBC / HPF 12/02/2023 0-5  0 - 5 RBC/hpf Final   WBC, UA 12/02/2023 0-5  0 - 5 WBC/hpf Final   Bacteria, UA 12/02/2023 NONE SEEN  NONE SEEN Final   Squamous Epithelial / HPF 12/02/2023 0-5  0 - 5 /HPF Final   Performed at [facility], [address], [place], [place] [address]   Acetaminophen  (Tylenol ), Serum 12/02/2023 <10 (L)  10 - 30 ug/mL Final   Comment: (  NOTE) Therapeutic concentrations vary significantly. A range of 10-30 ug/mL  may be an effective concentration for many patients. However, some  are best treated at concentrations outside of this range. Acetaminophen  concentrations >150 ug/mL at 4 hours after ingestion  and >50  ug/mL at 12 hours after ingestion are often associated with  toxic reactions.  Performed at [facility], [address] [place], [place] [address]    Salicylate Lvl 12/02/2023 <7.0 (L)  7.0 - 30.0 mg/dL Final   Performed at [facility], [address] [place], [place] [address]   Troponin I (High Sensitivity) 12/02/2023 9  <18 ng/L Final   Comment: (NOTE) Elevated high sensitivity troponin I (hsTnI) values and significant  changes across serial measurements may suggest ACS but many other  chronic and acute conditions are known to elevate hsTnI results.  Refer to the Links section for chest pain algorithms and additional  guidance. Performed at [facility], [address] [place], [place] [address]    Troponin I (High Sensitivity) 12/02/2023 10  <18 ng/L Final   Comment: (NOTE) Elevated high sensitivity troponin I (hsTnI) values and significant  changes across serial measurements may suggest ACS but many other  chronic and acute conditions are known to elevate hsTnI results.  Refer to the Links section for chest pain algorithms and additional  guidance. Performed at [facility], [address] [place], [place] [address]    Sodium 12/02/2023 134 (L)  135 - 145 mmol/L Final   Potassium 12/02/2023 4.0  3.5 - 5.1 mmol/L Final   Chloride 12/02/2023 103  98 - 111 mmol/L Final   CO2 12/02/2023 21 (L)  22 - 32 mmol/L Final   Glucose, Bld 12/02/2023 410 (H)  70 - 99 mg/dL Final   Glucose reference range applies only to samples taken after fasting for at least 8 hours.   BUN 12/02/2023 10  8 - 23 mg/dL Final   Creatinine, Ser 12/02/2023 1.13  0.61 - 1.24 mg/dL Final   Calcium  12/02/2023 8.3 (L)  8.9 - 10.3 mg/dL Final   GFR, Estimated 12/02/2023 >60  >60 mL/min Final   Comment: (NOTE) Calculated using the CKD-EPI Creatinine Equation (2021)    Anion gap 12/02/2023 10  5 - 15 Final   Performed at [facility], [address], [place], [place]  [address]   Glucose-Capillary 12/02/2023 367 (H)  70 - 99 mg/dL Final   Glucose reference range applies only to samples taken after fasting for at least 8 hours.   Glucose-Capillary 12/02/2023 359 (H)  70 - 99 mg/dL Final   Glucose reference range applies only to samples taken after fasting for at least 8 hours.   Glucose-Capillary 12/02/2023 104 (H)  70 - 99 mg/dL Final   Glucose reference range applies only to samples taken after fasting for at least 8 hours.  Admission on 11/26/2023, Discharged on 11/29/2023  Component Date Value Ref Range Status   WBC 11/26/2023 20.4 (H)  4.0 - 10.5 K/uL Final   RBC 11/26/2023 4.72  4.22 - 5.81 MIL/uL Final   Hemoglobin 11/26/2023 14.1  13.0 - 17.0 g/dL Final   HCT 90/82/7974 45.5  39.0 - 52.0 % Final   MCV 11/26/2023 96.4  80.0 - 100.0 fL Final   MCH 11/26/2023 29.9  26.0 - 34.0 pg Final   MCHC 11/26/2023 31.0  30.0 - 36.0 g/dL Final   RDW 90/82/7974 13.2  11.5 - 15.5 % Final  Platelets 11/26/2023 412 (H)  150 - 400 K/uL Final   nRBC 11/26/2023 0.0  0.0 - 0.2 % Final   Neutrophils Relative % 11/26/2023 87  % Final   Neutro Abs 11/26/2023 17.8 (H)  1.7 - 7.7 K/uL Final   Lymphocytes Relative 11/26/2023 7  % Final   Lymphs Abs 11/26/2023 1.5  0.7 - 4.0 K/uL Final   Monocytes Relative 11/26/2023 3  % Final   Monocytes Absolute 11/26/2023 0.6  0.1 - 1.0 K/uL Final   Eosinophils Relative 11/26/2023 2  % Final   Eosinophils Absolute 11/26/2023 0.3  0.0 - 0.5 K/uL Final   Basophils Relative 11/26/2023 0  % Final   Basophils Absolute 11/26/2023 0.1  0.0 - 0.1 K/uL Final   WBC Morphology 11/26/2023 See Note   Final   INCREASED BANDS (>20% BANDS)   RBC Morphology 11/26/2023 MORPHOLOGY UNREMARKABLE   Final   Smear Review 11/26/2023 Normal platelet morphology   Final   Immature Granulocytes 11/26/2023 1  % Final   Abs Immature Granulocytes 11/26/2023 0.11 (H)  0.00 - 0.07 K/uL Final   Performed at [facility], [address],  [place], [place] [address]   Sodium 11/26/2023 137  135 - 145 mmol/L Final   Potassium 11/26/2023 4.4  3.5 - 5.1 mmol/L Final   Chloride 11/26/2023 100  98 - 111 mmol/L Final   CO2 11/26/2023 20 (L)  22 - 32 mmol/L Final   Glucose, Bld 11/26/2023 272 (H)  70 - 99 mg/dL Final   Glucose reference range applies only to samples taken after fasting for at least 8 hours.   BUN 11/26/2023 30 (H)  8 - 23 mg/dL Final   Creatinine, Ser 11/26/2023 1.46 (H)  0.61 - 1.24 mg/dL Final   Calcium  11/26/2023 9.2  8.9 - 10.3 mg/dL Final   Total Protein 90/82/7974 7.5  6.5 - 8.1 g/dL Final   Albumin 90/82/7974 4.0  3.5 - 5.0 g/dL Final   AST 90/82/7974 18  15 - 41 U/L Final   ALT 11/26/2023 16  0 - 44 U/L Final   Alkaline Phosphatase 11/26/2023 106  38 - 126 U/L Final   Total Bilirubin 11/26/2023 1.2  0.0 - 1.2 mg/dL Final   GFR, Estimated 11/26/2023 53 (L)  >60 mL/min Final   Comment: (NOTE) Calculated using the CKD-EPI Creatinine Equation (2021)    Anion gap 11/26/2023 17 (H)  5 - 15 Final   Performed at [facility], [address], [place], [place] [address]   Lipase 11/26/2023 15  11 - 51 U/L Final   Performed at [facility], [address], [place], [place] [address]   Magnesium  11/26/2023 1.9  1.7 - 2.4 mg/dL Final   Performed at [facility], [address] [place], [place] [address]   Color, Urine 11/27/2023 YELLOW  YELLOW Final   APPearance 11/27/2023 CLEAR  CLEAR Final   Specific Gravity, Urine 11/27/2023 1.042 (H)  1.005 - 1.030 Final   pH 11/27/2023 5.0  5.0 - 8.0 Final   Glucose, UA 11/27/2023 150 (A)  NEGATIVE mg/dL Final   Hgb urine dipstick 11/27/2023 NEGATIVE  NEGATIVE Final   Bilirubin Urine 11/27/2023 NEGATIVE  NEGATIVE Final   Ketones, ur 11/27/2023 NEGATIVE  NEGATIVE mg/dL Final   Protein, ur 90/81/7974 NEGATIVE  NEGATIVE mg/dL Final   Nitrite 90/81/7974 NEGATIVE  NEGATIVE Final   Leukocytes,Ua 11/27/2023 NEGATIVE  NEGATIVE  Final   Performed at [facility], [address], [place], [place] [address]   C Diff antigen 11/27/2023 FORMED STOOL SPECIMEN SUBMITTED.  DOES NOT MEET TESTING CRITERIA, ORDER CREDITED (A)  NEGATIVE Final   C Diff toxin 11/27/2023 FORMED STOOL SPECIMEN SUBMITTED.  DOES NOT MEET TESTING CRITERIA, ORDER CREDITED (A)  NEGATIVE Final   C Diff interpretation 11/27/2023 FORMED STOOL SPECIMEN SUBMITTED.  DOES NOT MEET TESTING CRITERIA, ORDER CREDITED   Final   Comment:  JANESE TINNIE ORN 11/27/2023 9272 Performed at [facility], [address] [place], [place] [address]    WBC 11/27/2023 12.8 (H)  4.0 - 10.5 K/uL Final   RBC 11/27/2023 4.14 (L)  4.22 - 5.81 MIL/uL Final   Hemoglobin 11/27/2023 12.6 (L)  13.0 - 17.0 g/dL Final   HCT 90/81/7974 40.4  39.0 - 52.0 % Final   MCV 11/27/2023 97.6  80.0 - 100.0 fL Final   MCH 11/27/2023 30.4  26.0 - 34.0 pg Final   MCHC 11/27/2023 31.2  30.0 - 36.0 g/dL Final   RDW 90/81/7974 13.2  11.5 - 15.5 % Final   Platelets 11/27/2023 309  150 - 400 K/uL Final   nRBC 11/27/2023 0.0  0.0 - 0.2 % Final   Performed at [facility], [address], [place], [place] [address]   Sodium 11/27/2023 137  135 - 145 mmol/L Final   Potassium 11/27/2023 4.6  3.5 - 5.1 mmol/L Final   Chloride 11/27/2023 105  98 - 111 mmol/L Final   CO2 11/27/2023 19 (L)  22 - 32 mmol/L Final   Glucose, Bld 11/27/2023 263 (H)  70 - 99 mg/dL Final   Glucose reference range applies only to samples taken after fasting for at least 8 hours.   BUN 11/27/2023 27 (H)  8 - 23 mg/dL Final   Creatinine, Ser 11/27/2023 1.24  0.61 - 1.24 mg/dL Final   Calcium  11/27/2023 8.7 (L)  8.9 - 10.3 mg/dL Final   GFR, Estimated 11/27/2023 >60  >60 mL/min Final   Comment: (NOTE) Calculated using the CKD-EPI Creatinine Equation (2021)    Anion gap 11/27/2023 13  5 - 15 Final   Performed at [facility], [address] [place],  [place] [address]   Glucose-Capillary 11/27/2023 190 (H)  70 - 99 mg/dL Final   Glucose reference range applies only to samples taken after fasting for at least 8 hours.   Sodium 11/28/2023 137  135 - 145 mmol/L Final   Potassium 11/28/2023 4.6  3.5 - 5.1 mmol/L Final   Chloride 11/28/2023 105  98 - 111 mmol/L Final   CO2 11/28/2023 19 (L)  22 - 32 mmol/L Final   Glucose, Bld 11/28/2023 260 (H)  70 - 99 mg/dL Final   Glucose reference range applies only to samples taken after fasting for at least 8 hours.   BUN 11/28/2023 20  8 - 23 mg/dL Final   Creatinine, Ser 11/28/2023 1.09  0.61 - 1.24 mg/dL Final   Calcium  11/28/2023 8.6 (L)  8.9 - 10.3 mg/dL Final   GFR, Estimated 11/28/2023 >60  >60 mL/min Final   Comment: (NOTE) Calculated using the CKD-EPI Creatinine Equation (2021)    Anion gap 11/28/2023 13  5 - 15 Final   Performed at [facility], [address], [place], [place] [address]   Glucose-Capillary 11/27/2023 232 (H)  70 - 99 mg/dL Final   Glucose reference range applies only to samples taken after fasting for at least 8 hours.  WBC 11/28/2023 11.9 (H)  4.0 - 10.5 K/uL Final   RBC 11/28/2023 3.97 (L)  4.22 - 5.81 MIL/uL Final   Hemoglobin 11/28/2023 12.3 (L)  13.0 - 17.0 g/dL Final   HCT 90/80/7974 38.8 (L)  39.0 - 52.0 % Final   MCV 11/28/2023 97.7  80.0 - 100.0 fL Final   MCH 11/28/2023 31.0  26.0 - 34.0 pg Final   MCHC 11/28/2023 31.7  30.0 - 36.0 g/dL Final   RDW 90/80/7974 13.0  11.5 - 15.5 % Final   Platelets 11/28/2023 284  150 - 400 K/uL Final   nRBC 11/28/2023 0.0  0.0 - 0.2 % Final   Performed at [facility], [address] [place], [place] [address]   Glucose-Capillary 11/28/2023 270 (H)  70 - 99 mg/dL Final   Glucose reference range applies only to samples taken after fasting for at least 8 hours.   Glucose-Capillary 11/28/2023 97  70 - 99 mg/dL Final   Glucose reference range applies only to samples taken after fasting for at  least 8 hours.   Glucose-Capillary 11/28/2023 188 (H)  70 - 99 mg/dL Final   Glucose reference range applies only to samples taken after fasting for at least 8 hours.   WBC 11/29/2023 7.6  4.0 - 10.5 K/uL Final   RBC 11/29/2023 3.80 (L)  4.22 - 5.81 MIL/uL Final   Hemoglobin 11/29/2023 11.9 (L)  13.0 - 17.0 g/dL Final   HCT 90/79/7974 36.9 (L)  39.0 - 52.0 % Final   MCV 11/29/2023 97.1  80.0 - 100.0 fL Final   MCH 11/29/2023 31.3  26.0 - 34.0 pg Final   MCHC 11/29/2023 32.2  30.0 - 36.0 g/dL Final   RDW 90/79/7974 13.0  11.5 - 15.5 % Final   Platelets 11/29/2023 303  150 - 400 K/uL Final   nRBC 11/29/2023 0.0  0.0 - 0.2 % Final   Neutrophils Relative % 11/29/2023 58  % Final   Neutro Abs 11/29/2023 4.4  1.7 - 7.7 K/uL Final   Lymphocytes Relative 11/29/2023 28  % Final   Lymphs Abs 11/29/2023 2.2  0.7 - 4.0 K/uL Final   Monocytes Relative 11/29/2023 7  % Final   Monocytes Absolute 11/29/2023 0.5  0.1 - 1.0 K/uL Final   Eosinophils Relative 11/29/2023 7  % Final   Eosinophils Absolute 11/29/2023 0.5  0.0 - 0.5 K/uL Final   Basophils Relative 11/29/2023 0  % Final   Basophils Absolute 11/29/2023 0.0  0.0 - 0.1 K/uL Final   Immature Granulocytes 11/29/2023 0  % Final   Abs Immature Granulocytes 11/29/2023 0.02  0.00 - 0.07 K/uL Final   Performed at [facility], [address] [place], [place] [address]   Sodium 11/29/2023 138  135 - 145 mmol/L Final   Potassium 11/29/2023 3.8  3.5 - 5.1 mmol/L Final   Chloride 11/29/2023 105  98 - 111 mmol/L Final   CO2 11/29/2023 19 (L)  22 - 32 mmol/L Final   Glucose, Bld 11/29/2023 283 (H)  70 - 99 mg/dL Final   Glucose reference range applies only to samples taken after fasting for at least 8 hours.   BUN 11/29/2023 11  8 - 23 mg/dL Final   Creatinine, Ser 11/29/2023 1.10  0.61 - 1.24 mg/dL Final   Calcium  11/29/2023 8.7 (L)  8.9 - 10.3 mg/dL Final   Phosphorus 90/79/7974 2.4 (L)  2.5 - 4.6 mg/dL Final   Albumin 90/79/7974  3.4 (L)  3.5 - 5.0 g/dL Final   GFR, Estimated 11/29/2023 >60  >60 mL/min Final   Comment: (NOTE) Calculated using the CKD-EPI Creatinine Equation (2021)    Anion gap 11/29/2023 14  5 - 15 Final   Performed at [facility], [address], [place], [place] [address]   Magnesium  11/29/2023 1.4 (L)  1.7 - 2.4 mg/dL Final   Performed at [facility], [address], [place], [place] [address]   Glucose-Capillary 11/28/2023 186 (H)  70 - 99 mg/dL Final   Glucose reference range applies only to samples taken after fasting for at least 8 hours.   Glucose-Capillary 11/29/2023 233 (H)  70 - 99 mg/dL Final   Glucose reference range applies only to samples taken after fasting for at least 8 hours.   Glucose-Capillary 11/29/2023 176 (H)  70 - 99 mg/dL Final   Glucose reference range applies only to samples taken after fasting for at least 8 hours.  Admission on 11/20/2023, Discharged on 11/20/2023  Component Date Value Ref Range Status   Glucose-Capillary 11/20/2023 417 (H)  70 - 99 mg/dL Final   Glucose reference range applies only to samples taken after fasting for at least 8 hours.   WBC 11/20/2023 5.9  4.0 - 10.5 K/uL Final   RBC 11/20/2023 3.85 (L)  4.22 - 5.81 MIL/uL Final   Hemoglobin 11/20/2023 12.1 (L)  13.0 - 17.0 g/dL Final   HCT 90/88/7974 38.0 (L)  39.0 - 52.0 % Final   MCV 11/20/2023 98.7  80.0 - 100.0 fL Final   MCH 11/20/2023 31.4  26.0 - 34.0 pg Final   MCHC 11/20/2023 31.8  30.0 - 36.0 g/dL Final   RDW 90/88/7974 13.1  11.5 - 15.5 % Final   Platelets 11/20/2023 318  150 - 400 K/uL Final   nRBC 11/20/2023 0.0  0.0 - 0.2 % Final   Neutrophils Relative % 11/20/2023 47  % Final   Neutro Abs 11/20/2023 2.8  1.7 - 7.7 K/uL Final   Lymphocytes Relative 11/20/2023 39  % Final   Lymphs Abs 11/20/2023 2.3  0.7 - 4.0 K/uL Final   Monocytes Relative 11/20/2023 8  % Final   Monocytes Absolute 11/20/2023 0.5  0.1 - 1.0 K/uL Final   Eosinophils  Relative 11/20/2023 5  % Final   Eosinophils Absolute 11/20/2023 0.3  0.0 - 0.5 K/uL Final   Basophils Relative 11/20/2023 0  % Final   Basophils Absolute 11/20/2023 0.0  0.0 - 0.1 K/uL Final   Immature Granulocytes 11/20/2023 1  % Final   Abs Immature Granulocytes 11/20/2023 0.05  0.00 - 0.07 K/uL Final   Performed at [facility], [address], [place], [place] [address]   Sodium 11/20/2023 137  135 - 145 mmol/L Final   Potassium 11/20/2023 3.9  3.5 - 5.1 mmol/L Final   Chloride 11/20/2023 102  98 - 111 mmol/L Final   CO2 11/20/2023 21 (L)  22 - 32 mmol/L Final   Glucose, Bld 11/20/2023 465 (H)  70 - 99 mg/dL Final   Glucose reference range applies only to samples taken after fasting for at least 8 hours.   BUN 11/20/2023 18  8 - 23 mg/dL Final   Creatinine, Ser 11/20/2023 1.09  0.61 - 1.24 mg/dL Final   Calcium  11/20/2023 8.5 (L)  8.9 - 10.3 mg/dL Final   Total Protein 90/88/7974 6.1 (L)  6.5 - 8.1 g/dL Final   Albumin 90/88/7974 2.9 (L)  3.5 - 5.0  g/dL Final   AST 90/88/7974 18  15 - 41 U/L Final   ALT 11/20/2023 17  0 - 44 U/L Final   Alkaline Phosphatase 11/20/2023 85  38 - 126 U/L Final   Total Bilirubin 11/20/2023 0.6  0.0 - 1.2 mg/dL Final   GFR, Estimated 11/20/2023 >60  >60 mL/min Final   Comment: (NOTE) Calculated using the CKD-EPI Creatinine Equation (2021)    Anion gap 11/20/2023 14  5 - 15 Final   Performed at [facility], [address] [place], [place] [address]   Sodium 11/20/2023 139  135 - 145 mmol/L Final   Potassium 11/20/2023 3.9  3.5 - 5.1 mmol/L Final   Chloride 11/20/2023 105  98 - 111 mmol/L Final   BUN 11/20/2023 9  8 - 23 mg/dL Final   Creatinine, Ser 11/20/2023 1.00  0.61 - 1.24 mg/dL Final   Glucose, Bld 90/88/7974 481 (H)  70 - 99 mg/dL Final   Glucose reference range applies only to samples taken after fasting for at least 8 hours.   Calcium , Ion 11/20/2023 1.11 (L)  1.15 - 1.40 mmol/L Final   TCO2 11/20/2023 22  22 - 32 mmol/L  Final   Hemoglobin 11/20/2023 12.9 (L)  13.0 - 17.0 g/dL Final   HCT 90/88/7974 38.0 (L)  39.0 - 52.0 % Final   Color, Urine 11/20/2023 STRAW (A)  YELLOW Final   APPearance 11/20/2023 CLEAR  CLEAR Final   Specific Gravity, Urine 11/20/2023 1.025  1.005 - 1.030 Final   pH 11/20/2023 6.0  5.0 - 8.0 Final   Glucose, UA 11/20/2023 >=500 (A)  NEGATIVE mg/dL Final   Hgb urine dipstick 11/20/2023 NEGATIVE  NEGATIVE Final   Bilirubin Urine 11/20/2023 NEGATIVE  NEGATIVE Final   Ketones, ur 11/20/2023 NEGATIVE  NEGATIVE mg/dL Final   Protein, ur 90/88/7974 NEGATIVE  NEGATIVE mg/dL Final   Nitrite 90/88/7974 NEGATIVE  NEGATIVE Final   Leukocytes,Ua 11/20/2023 NEGATIVE  NEGATIVE Final   RBC / HPF 11/20/2023 0-5  0 - 5 RBC/hpf Final   WBC, UA 11/20/2023 0-5  0 - 5 WBC/hpf Final   Bacteria, UA 11/20/2023 NONE SEEN  NONE SEEN Final   Squamous Epithelial / HPF 11/20/2023 0-5  0 - 5 /HPF Final   Performed at [facility], [address] [place], [place] [address]   Beta-Hydroxybutyric Acid 11/20/2023 0.11  0.05 - 0.27 mmol/L Final   Performed at [facility], [address], [place], [place] [address]   Glucose-Capillary 11/20/2023 187 (H)  70 - 99 mg/dL Final   Glucose reference range applies only to samples taken after fasting for at least 8 hours.  Office Visit on 11/19/2023  Component Date Value Ref Range Status   POC Glucose 11/19/2023 398 (A)  70 - 99 mg/dl Final  There may be more visits with results that are not included.    Allergies: Lisinopril   Medications:  PTA Medications  Medication Sig   Accu-Chek Softclix Lancets lancets Use to check blood sugar three times daily as directed   Blood Glucose Monitoring Suppl (ACCU-CHEK GUIDE ME) w/Device KIT Use to test blood sugar 3 times daily.   glucose blood (ACCU-CHEK GUIDE TEST) test strip Use as instructed to check blood sugar three times daily   lidocaine  (LIDODERM ) 5 % Place 1 patch onto the skin daily. Remove & Discard patch  within 12 hours or as directed by MD (Patient not taking: Reported on 12/05/2023)   DULoxetine  (CYMBALTA ) 60 MG  capsule Take 1 capsule (60 mg total) by mouth daily.   pantoprazole  (PROTONIX ) 40 MG tablet Take 1 tablet (40 mg total) by mouth daily.   gabapentin  (NEURONTIN ) 300 MG capsule Take 1 capsule (300 mg total) by mouth 3 (three) times daily.   metFORMIN  (GLUCOPHAGE ) 500 MG tablet Take 1 tablet (500 mg total) by mouth 2 (two) times daily with a meal. (Patient not taking: Reported on 12/05/2023)   loperamide  (IMODIUM ) 2 MG capsule Take 1 capsule (2 mg total) by mouth 4 (four) times daily as needed for diarrhea or loose stools. (Patient not taking: Reported on 12/05/2023)      Medical Decision Making  Admission to Observation unit. Discharge in AM upon reevaluation EKG  Agitation protocol Insulin  sliding scale  protocol Acetaminophen  650 mg PO Q 6 PRN Maalox 30 ml PO Q 4 PRN Milk of Magnesia 30 ml PO Daily PRN Labs: UDS, A1C, CBG  Recommendations  Based on my evaluation the patient does not appear to have an emergency medical condition.  Randall Bouquet, NP 12/06/23  8:36 PM

## 2023-12-06 NOTE — ED Notes (Signed)
 Pt belongings in locker room in purple.

## 2023-12-06 NOTE — Discharge Instructions (Signed)
You were evaluated in the Emergency Department and after careful evaluation, we did not find any emergent condition requiring admission or further testing in the hospital.  Your exam/testing today was overall reassuring.  Please return to the Emergency Department if you experience any worsening of your condition.  Thank you for allowing us to be a part of your care.  

## 2023-12-06 NOTE — Discharge Instructions (Signed)
 Follow-up with your outpatient mental health team

## 2023-12-06 NOTE — ED Notes (Signed)
 Discharge papers and bus pass given. Security escorted pt off property

## 2023-12-06 NOTE — ED Provider Triage Note (Signed)
 Emergency Medicine Provider Triage Evaluation Note  David Wyatt , a 63 y.o. male  was evaluated in triage.  Pt complains of continued SI with plan to overdose on insulin , continues to state that he cannot take it anymore.  He is asleep on the bed upon assessment, has been seen multiple times for similar complaints over the last 48 hours.  No complaints of pain or any other medical complaints at this time.  Workup ongoing prior to assessment, shows glucose of 267.David Wyatt  Review of Systems  Positive: As above Negative:   Physical Exam  BP (!) 145/102 (BP Location: Right Arm)   Pulse 85   Temp 97.8 F (36.6 C) (Oral)   Resp 18   SpO2 100%  Gen:   Awake, no distress   Resp:  Normal effort  MSK:   Moves extremities without difficulty  Other:    Medical Decision Making  Medically screening exam initiated at 12:44 PM.  Appropriate orders placed.  David Wyatt was informed that the remainder of the evaluation will be completed by another provider, this initial triage assessment does not replace that evaluation, and the importance of remaining in the ED until their evaluation is complete.  Psych hold orders placed secondary to endorsed SI.   David Wyatt, David Wyatt 12/06/23 1246

## 2023-12-06 NOTE — ED Provider Notes (Signed)
 [place] EMERGENCY DEPARTMENT AT [facility] Provider Note   CSN: [id number] Arrival date & time: 12/06/23  1034     Patient presents with: SI   David Wyatt is a 63 y.o. male.   HPI   Patient has a history of depression, homelessness, substance abuse.  Patient also has a history of recurrent visits to the ED with complaints of suicidal ideation with concerns for malingering.  Patient was in the emergency room last evening he was also in the emergency room yesterday afternoon.  Patient was evaluated by psychiatry at that time and was cleared for outpatient management.  Patient presents with the same complaints.  He states he cannot take it anymore.  He wants to take all of his medications.  Prior to Admission medications   Medication Sig Start Date End Date Taking? Authorizing Provider  Accu-Chek Softclix Lancets lancets Use to check blood sugar three times daily as directed 11/01/23   Shrivastava, Aryendra, MD  Blood Glucose Monitoring Suppl (ACCU-CHEK GUIDE ME) w/Device KIT Use to test blood sugar 3 times daily. 11/01/23   Shrivastava, Aryendra, MD  DULoxetine  (CYMBALTA ) 60 MG capsule Take 1 capsule (60 mg total) by mouth daily. 11/01/23   Shrivastava, Aryendra, MD  gabapentin  (NEURONTIN ) 300 MG capsule Take 1 capsule (300 mg total) by mouth 3 (three) times daily. 11/29/23   Rosario Eland I, MD  glucose blood (ACCU-CHEK GUIDE TEST) test strip Use as instructed to check blood sugar three times daily 11/01/23   Shrivastava, Aryendra, MD  lidocaine  (LIDODERM ) 5 % Place 1 patch onto the skin daily. Remove & Discard patch within 12 hours or as directed by MD Patient not taking: Reported on 12/05/2023 11/01/23   Shrivastava, Aryendra, MD  loperamide  (IMODIUM ) 2 MG capsule Take 1 capsule (2 mg total) by mouth 4 (four) times daily as needed for diarrhea or loose stools. Patient not taking: Reported on 12/05/2023 12/04/23   Towana Ozell BROCKS, MD  metFORMIN  (GLUCOPHAGE ) 500 MG tablet  Take 1 tablet (500 mg total) by mouth 2 (two) times daily with a meal. Patient not taking: Reported on 12/05/2023 11/29/23   Rosario Eland I, MD  pantoprazole  (PROTONIX ) 40 MG tablet Take 1 tablet (40 mg total) by mouth daily. 11/30/23   Rosario Eland FERNS, MD    Allergies: Lisinopril     Review of Systems  Updated Vital Signs BP (!) 145/102 (BP Location: Right Arm)   Pulse 85   Temp 97.8 F (36.6 C) (Oral)   Resp 18   SpO2 100%   Physical Exam Vitals and nursing note reviewed.  Constitutional:      General: He is not in acute distress.    Appearance: He is well-developed.  HENT:     Head: Normocephalic and atraumatic.     Right Ear: External ear normal.     Left Ear: External ear normal.  Eyes:     General: No scleral icterus.       Right eye: No discharge.        Left eye: No discharge.     Conjunctiva/sclera: Conjunctivae normal.  Neck:     Trachea: No tracheal deviation.  Cardiovascular:     Rate and Rhythm: Normal rate.  Pulmonary:     Effort: Pulmonary effort is normal. No respiratory distress.     Breath sounds: No stridor.  Abdominal:     General: There is no distension.  Musculoskeletal:        General: No swelling or deformity.  Cervical back: Neck supple.  Skin:    General: Skin is warm and dry.     Findings: No rash.  Neurological:     Mental Status: He is alert. Mental status is at baseline.     Cranial Nerves: No dysarthria or facial asymmetry.     Motor: No seizure activity.  Psychiatric:        Mood and Affect: Mood is not elated. Affect is not angry.        Speech: He is communicative. Speech is not delayed.        Behavior: Behavior is not aggressive or hyperactive.        Thought Content: Thought content does not include homicidal or suicidal ideation.     (all labs ordered are listed, but only abnormal results are displayed) Labs Reviewed  COMPREHENSIVE METABOLIC PANEL WITH GFR - Abnormal; Notable for the following components:       Result Value   Glucose, Bld 267 (*)    Albumin 3.4 (*)    All other components within normal limits  CBC - Abnormal; Notable for the following components:   Platelets 493 (*)    All other components within normal limits  CBG MONITORING, ED - Abnormal; Notable for the following components:   Glucose-Capillary 254 (*)    All other components within normal limits  ETHANOL  RAPID URINE DRUG SCREEN, HOSP PERFORMED    EKG: None  Radiology: No results found.   Procedures   Medications Ordered in the ED - No data to display                                  Medical Decision Making Amount and/or Complexity of Data Reviewed Labs: ordered.      Provider Suicide Risk Assessment Note  Nursing Documentation C-SSRS RISK CATEGORY: High Risk  ED Suicide Bundle Interventions (Must be higher or equal to C-SSRS Score): High Risk Interventions implemented 1:1 monitoring intiated  Suicide Risk Assessment:  Based on my clinical evaluation, I estimate the patient to be at chronic low risk of self-harm in the current setting. This decision is based on my review of the chart including patient's history and current presentation, interview of the patient, mental status examination, and consideration of suicide risk including evaluating suicidal ideation, plan, intent, suicidal or self-harm behaviors, risk factors, and protective factors.  Patient has following modifiable risk factors for suicide: medication noncompliance Patient has following non-modifiable or demographic risk factors for suicide:male gender Patient has the following protective factors against suicide: Access to outpatient mental health care Mitigation: Risk for suicide is being addressed by recommendations for: outpatient consult/treatment (low risk) Patient has history of multiple visits to the ED.  He presents with suicidal ideation.  Patient has a pattern of getting discharged from the ED and checking right back in again.   Patient unfortunately does not have a home.  At this time I have low suspicion for intent to harm himself.  He had a thorough evaluation by psychiatry yesterday and many other times in the past..  I suspect patient is here primarily for secondary gain for food and housing.   Patient was frustrated with the meal he was provided.  He states he always gets sandwiches.      Final diagnoses:  Depression, unspecified depression type    ED Discharge Orders     None  Randol Simmonds, MD 12/06/23 1309

## 2023-12-06 NOTE — BH Assessment (Signed)
 Comprehensive Clinical Assessment (CCA) Note   12/06/2023 David Wyatt [id number]  Disposition: Per David Wyatt ,NP, patient is recommended for overnight observation with re-evaluation in the morning.  Disposition SW to pursue appropriate inpatient options.  The patient demonstrates the following risk factors for suicide: Chronic risk factors for suicide include: psychiatric disorder of MDD. Acute risk factors for suicide include: unemployment and social withdrawal/isolation. Protective factors for this patient include: positive social support and hope for the future. Considering these factors, the overall suicide risk at this point appears to be high. Patient is not appropriate for outpatient follow up.   Patient is a 63 year old male with a history of sleep disturbance and MDD who presents voluntarily to [facility] for an assessment. Patient is homeless. Patient reports isolation, crying spells, irritability, hopelessness, guilt, loss of interest to do things they enjoy, fatigue, lack of concentration, worthlessness, change in sleep, change in appetite. Patient reports history of suicidal ideation. Per chart, pt overdosed on his insulin  earlier today.  Patient has a hx of Substance Abuse but reports no ETOH or drug use in a few months.  Patient denies NSSIB, HI, AVH.  Patient identifies his primary stressors as homelessness. Patient denies history of abuse or trauma. Patient denies current legal problems. Patient is not receiving outpatient therapy and psychiatry services. Patient reports he  takes his medications as prescribed (see MAR) and denies recent medication changes. Patient denies previous inpatient admission. Patient denies access to weapons.   Patient is cannot to contract for safety outside of the hospital.    Treatment options were discussed and patient is in agreement with recommendation for continuous observation.   During evaluation pt is in no acute  distress. He is alert, oriented x 4, calm, cooperative and attentive. his mood is depressed and flat with congruent affect. He has normal speech, and behavior.  Objectively there is no evidence of psychosis/mania or delusional thinking.  Patient is able to converse coherently, goal directed thoughts, no distractibility, or pre-occupation.   He also denies homicidal ideation, psychosis, and paranoia.  Patient answered question appropriately.       Chief Complaint: SI  Visit Diagnosis: MDD    CCA Screening, Triage and Referral (STR)  Patient Reported Information How did you hear about us ? Self  What Is the Reason for Your Visit/Call Today? Pt is a 37Y male arrived at [facility] via [facility], voluntarily. PT called [employer] after being discharged from the hospital, stated that he needed help. PT stated that he is diagnosed with depression and feeling suicidal with the plan to overdose in medications . No hx of suicide attempts. PT denies HI and AVH.  How Long Has This Been Causing You Problems? > than 6 months  What Do You Feel Would Help You the Most Today? Treatment for Depression or other mood problem; Stress Management; Medication(s)   Have You Recently Had Any Thoughts About Hurting Yourself? Yes  Are You Planning to Commit Suicide/Harm Yourself At This time? Yes (plan to overdose on medication)   Flowsheet Row ED from 12/06/2023 in [facility] Emergency Department at [facility] Most recent reading at 12/06/2023  2:17 PM ED from 12/06/2023 in [facility] Emergency Department at [facility] Most recent reading at 12/06/2023 10:47 AM ED from 12/05/2023 in [facility] Emergency Department at [facility] Most recent reading at 12/05/2023  8:04 PM  C-SSRS RISK CATEGORY High Risk High Risk No Risk    Have you Recently Had  Thoughts About Hurting Someone [name]? No  Are You Planning to Harm Someone at This Time? No  Explanation: N/A   Have You Used Any Alcohol or Drugs in the Past  24 Hours? No  How Long Ago Did You Use Drugs or Alcohol? Last night  What Did You Use and How Much? yesterday - crack 50 dollars worth   Do You Currently Have a Therapist/Psychiatrist? No  Name of Therapist/Psychiatrist:    Have You Been Recently Discharged From Any Office Practice or Programs? No  Explanation of Discharge From Practice/Program: n/a    CCA Screening Triage Referral Assessment Type of Contact: Face-to-Face  Telemedicine Service Delivery:   Is this Initial or Reassessment?   Date Telepsych consult ordered in [facility]:    Time Telepsych consult ordered in [facility]:    Location of Assessment: [facility] Services  Provider Location: [facility] Assessment Services   Collateral Involvement: N/A   Does Patient Have a Automotive engineer Guardian? No  Legal Guardian Contact Information: n/a  Copy of Legal Guardianship Form: -- (n/a)  Legal Guardian Notified of Arrival: -- (n/a)  Legal Guardian Notified of Pending Discharge: -- (n/a)  If Minor and Not Living with Parent(s), Who has Custody? n/a  Is CPS involved or ever been involved? Never  Is APS involved or ever been involved? Never   Patient Determined To Be At Risk for Harm To Self or Others Based on Review of Patient Reported Information or Presenting Complaint? Yes, for Self-Harm  Method: Plan without intent  Availability of Means: Has close by  Intent: Intends to cause physical harm but not necessarily death  Notification Required: No need or identified person  Additional Information for Danger to Others Potential: -- (n/a)  Additional Comments for Danger to Others Potential: n/a  Are There Guns or Other Weapons in Your Home? No  Types of Guns/Weapons: Denies access  Are These Weapons Safely Secured?                            No  Who Could Verify You Are Able To Have These Secured: Denies access  Do You Have any Outstanding Charges, Pending Court Dates, Parole/Probation? Denies pending  legal charges  Contacted To Inform of Risk of Harm To Self or Others: Other: Comment (n/a)    Does Patient Present under Involuntary Commitment? No    [place] of Residence: [place]   Patient Currently Receiving the Following Services: Not Receiving Services   Determination of Need: Urgent (48 hours)   Options For Referral: Medication Management; Other: Comment (Continue Observation)     CCA Biopsychosocial Patient Reported Schizophrenia/Schizoaffective Diagnosis in Past: No   Strengths: Self-awareness   Mental Health Symptoms Depression:  Change in energy/activity; Fatigue; Hopelessness; Increase/decrease in appetite; Sleep (too much or little)   Duration of Depressive symptoms: Duration of Depressive Symptoms: Greater than two weeks   Mania:  None   Anxiety:   Sleep; Tension; Worrying; Restlessness; Fatigue   Psychosis:  None   Duration of Psychotic symptoms:    Trauma:  None   Obsessions:  None   Compulsions:  None   Inattention:  None   Hyperactivity/Impulsivity:  None   Oppositional/Defiant Behaviors:  None   Emotional Irregularity:  None   Other Mood/Personality Symptoms:  None noted    Mental Status Exam Appearance and self-care  Stature:  Average   Weight:  Average weight   Clothing:  -- (Scrubs)  Grooming:  Normal   Cosmetic use:  None   Posture/gait:  Normal   Motor activity:  Not Remarkable   Sensorium  Attention:  Normal   Concentration:  Normal   Orientation:  X5   Recall/memory:  Normal   Affect and Mood  Affect:  Appropriate   Mood:  Depressed   Relating  Eye contact:  Normal   Facial expression:  Responsive   Attitude toward examiner:  Cooperative   Thought and Language  Speech flow: Normal   Thought content:  Appropriate to Mood and Circumstances   Preoccupation:  None   Hallucinations:  None   Organization:  Coherent   [employer] of Knowledge:  Average   Intelligence:   Average   Abstraction:  Normal   Judgement:  Fair   Dance movement psychotherapist:  Adequate   Insight:  Fair   Decision Making:  Normal   Social Functioning  Social Maturity:  Isolates   Social Judgement:  Chief of Staff; Normal   Stress  Stressors:  Housing; Office manager Ability:  Normal   Skill Deficits:  None   Supports:  Support needed     Religion: Religion/Spirituality Are You A Religious Person?: No How Might This Affect Treatment?: NA  Leisure/Recreation: Leisure / Recreation Do You Have Hobbies?: No  Exercise/Diet: Exercise/Diet Do You Exercise?: No Have You Gained or Lost A Significant Amount of Weight in the Past Six Months?: No Do You Follow a Special Diet?: No Do You Have Any Trouble Sleeping?: Yes Explanation of Sleeping Difficulties: Pt reports that he is unable to sleep. Pt reports that he is getting about 2-4 hours   CCA Employment/Education Employment/Work Situation: Employment / Work Systems developer: On disability Why is Patient on Disability: Mental health How Long has Patient Been on Disability: UTA Patient's Job has Been Impacted by Current Illness: No (n/a) Has Patient ever Been in the [employer]?: No  Education: Education Is Patient Currently Attending School?: No Last Grade Completed: 12 Did You Attend College?: No Did You Have An Individualized Education Program (IIEP): No Did You Have Any Difficulty At School?: No Patient's Education Has Been Impacted by Current Illness: No   CCA Family/Childhood History Family and Relationship History: Family history Marital status: Single Does patient have children?: Yes How many children?:  (UTA) How is patient's relationship with their children?: UTA  Childhood History:  Childhood History By whom was/is the patient raised?: Both parents Did patient suffer any verbal/emotional/physical/sexual abuse as a child?: No Did patient suffer from severe childhood neglect?: No Has  patient ever been sexually abused/assaulted/raped as an adolescent or adult?: No Was the patient ever a victim of a crime or a disaster?: No Witnessed domestic violence?: No Has patient been affected by domestic violence as an adult?: No       CCA Substance Use Alcohol/Drug Use: Alcohol / Drug Use Pain Medications: see MAR Prescriptions: see MAR Over the Counter: see MAR History of alcohol / drug use?: Yes Longest period of sobriety (when/how long): months Negative Consequences of Use: Financial, Personal relationships Withdrawal Symptoms: None                         ASAM's:  Six Dimensions of Multidimensional Assessment  Dimension 1:  Acute Intoxication and/or Withdrawal Potential:      Dimension 2:  Biomedical Conditions and Complications:   Dimension 2:  Description of patient's biomedical conditions and  complications: Diabetes  Dimension 3:  Emotional, Behavioral, or Cognitive Conditions and Complications:  Dimension 3:  Description of emotional, behavioral, or cognitive conditions and complications: History of MDD  Dimension 4:  Readiness to Change:     Dimension 5:  Relapse, Continued use, or Continued Problem Potential:     Dimension 6:  Recovery/Living Environment:  Dimension 6:  Recovery/Iiving environment criteria description: Homeless  ASAM Severity Score:    ASAM Recommended Level of Treatment: ASAM Recommended Level of Treatment: Level II Partial Hospitalization Treatment   Substance use Disorder (SUD) Substance Use Disorder (SUD)  Checklist Symptoms of Substance Use:  (n/a)  Recommendations for Services/Supports/Treatments: Recommendations for Services/Supports/Treatments Recommendations For Services/Supports/Treatments: Other (Comment) (Continuous Observation)  Disposition Recommendation per psychiatric provider: Continuous Observation   DSM5 Diagnoses: Patient Active Problem List   Diagnosis Date Noted   Depressive disorder 12/05/2023    Suicidal ideation 12/02/2023   Deficient knowledge of community resources 12/02/2023   Nausea vomiting and diarrhea 11/27/2023   Abdominal pain 11/27/2023   Chronic health problem 10/22/2023   AKI (acute kidney injury) 10/18/2023   Hyperglycemia 10/18/2023   Hyperglycemia due to diabetes mellitus (HCC) 09/27/2023   Malingering 07/15/2023   MDD (major depressive disorder), recurrent episode, severe (HCC) 05/19/2023   MDD (major depressive disorder), recurrent episode, mild 07/16/2022   MDD (major depressive disorder), recurrent severe, 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   Substance induced mood disorder (HCC) 06/18/2022   Homelessness 06/18/2022   Inadequate community resources 06/18/2022   Inability to access community resources due to transportation insecurity 06/18/2022   Cocaine use disorder (HCC) 03/03/2022   Cocaine use disorder, severe, dependence (HCC) 01/21/2022   Suicidal behavior 01/20/2022   Acute respiratory failure with hypoxia (HCC) 10/13/2021   History of cocaine use 07/23/2021   Chronic right shoulder pain 07/23/2021   Renal stones 07/23/2021   Dermoid cyst of skin of back 07/23/2021   Uncontrolled type 2 diabetes mellitus with hyperglycemia (HCC) 03/22/2021   Left kidney mass 03/22/2021   Hydronephrosis of right kidney 02/23/2021   Type 2 diabetes mellitus with hyperglycemia (HCC) 02/23/2021   Tobacco abuse 08/19/2018   Hyperlipidemia 04/24/2017   GERD (gastroesophageal reflux disease) 06/13/2015   Diabetic neuropathy (HCC) 05/01/2015   Essential hypertension, benign 11/01/2013   Lumbar disc herniation 09/13/2013   Constipation 09/13/2013   Chronic back pain 09/13/2013   Felon of finger of left hand with lymphangitis 12/13/2012   Crack cocaine use 07/29/2012   Arthritis 07/29/2012   Weakness generalized 07/29/2012     Referrals to Alternative Service(s): Referred to Alternative Service(s):   Place:   Date:   Time:    Referred to Alternative Service(s):    Place:   Date:   Time:    Referred to Alternative Service(s):   Place:   Date:   Time:    Referred to Alternative Service(s):   Place:   Date:   Time:     Rosina PARAS, [place], [facility]

## 2023-12-06 NOTE — ED Triage Notes (Signed)
 Pt here with ongoing thoughts of SI. Plan to take insulin  or any medication. Pt states he is just tired of it all.

## 2023-12-06 NOTE — ED Provider Notes (Signed)
 [facility]-EM[facility]Y DEPT [facility] Emergency Department Provider Note MRN:  [id number]  Arrival date & time: 12/06/23     Chief Complaint   Psychiatric Evaluation   History of Present Illness   David Wyatt is a 63 y.o. year-old male with a history of diabetes presenting to the ED with chief complaint of psychiatric evaluation.  Patient again complaining of suicidality.  Was recently discharged and cleared by psychiatry and never left the waiting room, checked back in.  On my evaluation he is wondering what time is and is wanting to be able to sleep until the buses start up.  Review of Systems  A thorough review of systems was obtained and all systems are negative except as noted in the HPI and PMH.   Patient's Health History    Past Medical History:  Diagnosis Date   Angioedema of lips 07/28/2012   left upper (07/29/2012)   Arthritis    hands and back   Chronic back pain    Cocaine abuse (HCC)    Diabetic neuropathy (HCC)    High cholesterol    Hypertension    Neuropathy    Type II diabetes mellitus (HCC)     Past Surgical History:  Procedure Laterality Date   BACK SURGERY     CYSTOSCOPY W/ URETERAL STENT PLACEMENT Right 02/23/2021   Procedure: CYSTOSCOPY WITH RETROGRADE PYELOGRAM/URETERAL STENT PLACEMENT;  Surgeon: Selma Donnice SAUNDERS, MD;  Location: [facility];  Service: Urology;  Laterality: Right;   CYSTOSCOPY/URETEROSCOPY/HOLMIUM LASER/STENT PLACEMENT Right 12/05/2022   Procedure: CYSTOSCOPY, RIGHT URETEROSCOPY, AND RIGHT URETERAL STENT PLACEMENT;  Surgeon: Shane Steffan BROCKS, MD;  Location: [facility];  Service: Urology;  Laterality: Right;  90 MINUTES   HERNIA REPAIR Right 04/01/2012   I & D EXTREMITY Left 12/13/2012   Procedure: IRRIGATION AND DEBRIDEMENT LEFT THUMB;  Surgeon: Franky SAUNDERS Curia, MD;  Location: [facility] OR;  Service: Orthopedics;  Laterality: Left;   INCISION AND DRAINAGE OF WOUND     boil on back/notes 07/14/2008 (07/29/2012)   INGUINAL HERNIA REPAIR  04/01/2012    Procedure: HERNIA REPAIR INGUINAL ADULT;  Surgeon: Sherlean JINNY Laughter, MD;  Location: [facility] OR;  Service: General;  Laterality: Right;   INGUINAL HERNIA REPAIR Left 09/06/2016   Procedure: OPEN REPAIR LEFT INGUINAL HERNIA;  Surgeon: Tanda Locus, MD;  Location: [facility] OR;  Service: General;  Laterality: Left;   INSERTION OF MESH Left 09/06/2016   Procedure: INSERTION OF MESH;  Surgeon: Tanda Locus, MD;  Location: [facility] OR;  Service: General;  Laterality: Left;   TONSILLECTOMY      Family History  Problem Relation Age of Onset   Diabetes Mother    Kidney disease Mother    Hyperlipidemia Mother    Hyperlipidemia Father    Diabetes Father     Social History   Socioeconomic History   Marital status: Divorced    Spouse name: Not on file   Number of children: 3   Years of education: 87   Highest education level: Not on file  Occupational History    Comment: disabled  Tobacco Use   Smoking status: Every Day    Current packs/day: 0.25    Average packs/day: 0.3 packs/day for 30.0 years (7.5 ttl pk-yrs)    Types: Cigarettes   Smokeless tobacco: Former    Quit date: 08/15/2015   Tobacco comments:    04/29/16 2  cigs daily, 10/27/17 sometimes < .25 PPD  Vaping Use   Vaping status: Never Used  Substance and Sexual Activity  Alcohol use: No    Alcohol/week: 0.0 standard drinks of alcohol   Drug use: Yes    Types: Crack cocaine   Sexual activity: Not on file  Other Topics Concern   Not on file  Social History Narrative   04/29/17   Lives in shelter   Caffeine- a lot of  tea, coffee   Social Drivers of Corporate investment banker Strain: Not on file  Food Insecurity: Food Insecurity Present (11/27/2023)   Hunger Vital Sign    Worried About Running Out of Food in the Last Year: Often true    Ran Out of Food in the Last Year: Often true  Transportation Needs: Unmet Transportation Needs (11/27/2023)   PRAPARE - Administrator, Civil Service (Medical): Yes    Lack of Transportation  (Non-Medical): Yes  Physical Activity: Not on file  Stress: Not on file  Social Connections: Unknown (10/24/2023)   Social Connection and Isolation Panel    Frequency of Communication with Friends and Family: Once a week    Frequency of Social Gatherings with Friends and Family: Never    Attends Religious Services: Never    Database administrator or Organizations: No    Attends Banker Meetings: Never    Marital Status: Patient unable to answer  Intimate Partner Violence: Not At Risk (11/27/2023)   Humiliation, Afraid, Rape, and Kick questionnaire    Fear of Current or Ex-Partner: No    Emotionally Abused: No    Physically Abused: No    Sexually Abused: No     Physical Exam   Vitals:   12/05/23 1929  BP: (!) 143/86  Pulse: 87  Resp: 17  Temp: 97.9 F (36.6 C)  SpO2: 96%    CONSTITUTIONAL: Well-appearing, NAD NEURO/PSYCH:  Alert and oriented x 3, no focal deficits EYES:  eyes equal and reactive ENT/NECK:  no LAD, no JVD CARDIO: Regular rate, well-perfused, normal S1 and S2 PULM:  CTAB no wheezing or rhonchi GI/GU:  non-distended, non-tender MSK/SPINE:  No gross deformities, no edema SKIN:  no rash, atraumatic   *Additional and/or pertinent findings included in MDM below  Diagnostic and Interventional Summary    EKG Interpretation Date/Time:    Ventricular Rate:    PR Interval:    QRS Duration:    QT Interval:    QTC Calculation:   R Axis:      Text Interpretation:         Labs Reviewed  CBG MONITORING, ED - Abnormal; Notable for the following components:      Result Value   Glucose-Capillary 164 (*)    All other components within normal limits  CBG MONITORING, ED - Abnormal; Notable for the following components:   Glucose-Capillary 60 (*)    All other components within normal limits  CBG MONITORING, ED - Abnormal; Notable for the following components:   Glucose-Capillary 105 (*)    All other components within normal limits  CBG  MONITORING, ED  CBG MONITORING, ED  CBG MONITORING, ED  CBG MONITORING, ED    No orders to display    Medications - No data to display   Procedures  /  Critical Care Procedures  ED Course and Medical Decision Making  Initial Impression and Ddx Patient is well-appearing in no acute distress, really no physical complaints and so no signs of medical emergency.  Has been cleared by psychiatry, known to exhibit this malingering type behavior for housing.  This is his  third time checking in without leaving.  Past medical/surgical history that increases complexity of ED encounter: Homelessness  Interpretation of Diagnostics Laboratory and/or imaging options to aid in the diagnosis/care of the patient were considered.  After careful history and physical examination, it was determined that there was no indication for diagnostics at this time.  Patient Reassessment and Ultimate Disposition/Management     Discharge  Patient management required discussion with the following services or consulting groups:  None  Complexity of Problems Addressed Acute complicated illness or Injury  Additional Data Reviewed and Analyzed Further history obtained from: None  Additional Factors Impacting ED Encounter Risk None  Ozell HERO. Theadore, MD [facility] Emergency Medicine [facility] [email or web address]  Final Clinical Impressions(s) / ED Diagnoses     ICD-10-CM   1. Homeless  Z59.00       ED Discharge Orders     None        Discharge Instructions Discussed with and Provided to Patient:    Discharge Instructions      You were evaluated in the Emergency Department and after careful evaluation, we did not find any emergent condition requiring admission or further testing in the hospital.  Your exam/testing today was overall reassuring.  Please return to the Emergency Department if you experience any worsening of your condition.  Thank you for allowing us  to be a  part of your care.       Theadore Ozell HERO, MD 12/06/23 [phone]
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
## 2023-12-06 NOTE — ED Notes (Signed)
 Patient wanded by security.

## 2023-12-06 NOTE — ED Provider Notes (Signed)
 [name] EMERGENCY DEPARTMENT AT [facility] Provider Note   CSN: [id number] Arrival date & time: 12/06/23  1410     Patient presents with: Suicidal   David Wyatt is a 63 y.o. male with past medical history of polysubstance abuse, HTN, HLD, insulin -dependent T2DM, medication noncompliance, homelessness, depression presents to emergency department via [employer].  Stated that he took 30 units of insulin  an hour ago because I need mental help and I want to die.  Was seen this morning at [facility], ED for [facility] with reassuring labs.  He was also evaluated and cleared yesterday by behavioral health at [facility] ED following taking 10 units of insulin  attempting to end his life.  Has had multiple ED visits for hyperglycemia, HI, SI, and concern for malingering over the past couple months.   HPI     Prior to Admission medications   Medication Sig Start Date End Date Taking? Authorizing Provider  Accu-Chek Softclix Lancets lancets Use to check blood sugar three times daily as directed 11/01/23   Shrivastava, Aryendra, MD  Blood Glucose Monitoring Suppl (ACCU-CHEK GUIDE ME) w/Device KIT Use to test blood sugar 3 times daily. 11/01/23   Shrivastava, Aryendra, MD  DULoxetine  (CYMBALTA ) 60 MG capsule Take 1 capsule (60 mg total) by mouth daily. 11/01/23   Shrivastava, Aryendra, MD  gabapentin  (NEURONTIN ) 300 MG capsule Take 1 capsule (300 mg total) by mouth 3 (three) times daily. 11/29/23   Rosario Eland I, MD  glucose blood (ACCU-CHEK GUIDE TEST) test strip Use as instructed to check blood sugar three times daily 11/01/23   Shrivastava, Aryendra, MD  lidocaine  (LIDODERM ) 5 % Place 1 patch onto the skin daily. Remove & Discard patch within 12 hours or as directed by MD Patient not taking: Reported on 12/05/2023 11/01/23   Shrivastava, Aryendra, MD  loperamide  (IMODIUM ) 2 MG capsule Take 1 capsule (2 mg total) by mouth 4 (four) times daily as needed for diarrhea or loose stools. Patient not taking:  Reported on 12/05/2023 12/04/23   Towana Ozell BROCKS, MD  metFORMIN  (GLUCOPHAGE ) 500 MG tablet Take 1 tablet (500 mg total) by mouth 2 (two) times daily with a meal. Patient not taking: Reported on 12/05/2023 11/29/23   Rosario Eland FERNS, MD  pantoprazole  (PROTONIX ) 40 MG tablet Take 1 tablet (40 mg total) by mouth daily. 11/30/23   Rosario Eland FERNS, MD    Allergies: Lisinopril     Review of Systems  Psychiatric/Behavioral:  Positive for suicidal ideas.     Updated Vital Signs BP (!) 140/102   Pulse 93   Temp 98.4 F (36.9 C) (Oral)   Resp 19   Ht 5' 8 (1.727 m)   Wt 73 kg   SpO2 98%   BMI 24.47 kg/m   Physical Exam Vitals and nursing note reviewed.  Constitutional:      General: He is not in acute distress.    Appearance: Normal appearance.  HENT:     Head: Normocephalic and atraumatic.  Eyes:     Conjunctiva/sclera: Conjunctivae normal.  Cardiovascular:     Rate and Rhythm: Normal rate.  Pulmonary:     Effort: Pulmonary effort is normal. No respiratory distress.     Breath sounds: Normal breath sounds.  Abdominal:     General: Bowel sounds are normal. There is no distension.     Palpations: Abdomen is soft.     Tenderness: There is no abdominal tenderness. There is no guarding.  Skin:    Coloration: Skin is not  jaundiced or pale.  Neurological:     Mental Status: He is alert and oriented to person, place, and time. Mental status is at baseline.     (all labs ordered are listed, but only abnormal results are displayed) Labs Reviewed  CBG MONITORING, ED - Abnormal; Notable for the following components:      Result Value   Glucose-Capillary 350 (*)    All other components within normal limits  CBG MONITORING, ED - Abnormal; Notable for the following components:   Glucose-Capillary 154 (*)    All other components within normal limits  CBG MONITORING, ED - Abnormal; Notable for the following components:   Glucose-Capillary 65 (*)    All other components within  normal limits  CBG MONITORING, ED - Abnormal; Notable for the following components:   Glucose-Capillary 149 (*)    All other components within normal limits  CBG MONITORING, ED - Abnormal; Notable for the following components:   Glucose-Capillary 188 (*)    All other components within normal limits    EKG: None  Radiology: No results found.   Medications Ordered in the ED - No data to display                                   Medical Decision Making Amount and/or Complexity of Data Reviewed Labs:  Decision-making details documented in ED Course.   Patient presents to the ED for concern of hypoglycemia, SI, this involves an extensive number of treatment options, and is a complaint that carries with it a high risk of complications and morbidity.  The differential diagnosis includes medication noncompliance, psychiatric condition   Co morbidities that complicate the patient evaluation  Medication noncompliance Insulin  dependent T2DM See HPI   Additional history obtained:  Additional history obtained from Nursing and Outside Medical Records   External records from outside source obtained and reviewed including triage RN note   Lab Tests:  I Ordered, and personally interpreted labs.  The pertinent results include:   CBG initially 350.     Consultations Obtained:  I requested consultation with TTS,  and discussed lab and imaging findings as well as pertinent plan - pending   Problem List / ED Course:  Hyperglycemia Reports that he took 30 units of his insulin  1 hour ago.  Initial CBG is 350. Baseline CBG appears to be 283-410 over the past 7 days.  Over these past 7 days has had workup for hyperglycemia with no anion gap nor signs of DKA. Will continue to monitor CBG but do not think that insulin , fluids is required at this time especially if he did take 3 units of insulin  and this appears to be his baseline CBG Upon reassessment, CBG decreased to 154 an hour  and a half later 154. 65 forty five minutes later. Patient provided sandwich, crackers, PB. Ate voluntarily wo difficulty nor vomiting Following eating, CBG increased to 149 35 minutes later and 188 an hour following that Provided him with an additional meal in ED as he expressed he was hungry which he ate voluntarily  SI No signs of self-injury on skin exam Has had multiple SI complaints in the past with evaluation by behavioral health. Most recently took 10 extra units of insulin  yesterday for SI.  Was evaluated by [facility] yesterday and cleared. Was evaluated by four separate EDP over past 24 hrs for similar complaints. While in ED, patient was found with  gloves wrapped around neck but did not resist when taken off of him Seems to be a component of malingering to SI. He states as long as I am able to stay until Monday, I will be fine Has been provided shelters and other resources several times but has not utilized them   Reevaluation:  After the interventions noted above, I reevaluated the patient and found that they have :improved   Social Determinants of Health:  Homelessness Polysubstance abuse Tobacco abuse   Dispostion:  After consideration of the diagnostic results and the patients response to treatment, I feel that the patent would benefit from outpatient management.   Discussed ED workup, disposition, return to ED precautions with patient who expresses understanding agrees with plan.  All questions answered to their satisfaction.  Provided shelter resources  Discussed patient with my attending Dr. Ula who reviewed ED workup, recent ED visits and agrees with plan  Final diagnoses:  Suicidal ideation    ED Discharge Orders     None        David Tinnie BRAVO, PA 12/06/23 [facility]    [name], MD 12/06/23 2233

## 2023-12-06 NOTE — ED Notes (Signed)
 The pt has been d/c again with a w/c full of belongings

## 2023-12-06 NOTE — ED Notes (Signed)
 Went into pt room for a glucose check and found pt with gloves knotted and tied around his neck. Removed the gloves and alerted Triage RN.

## 2023-12-06 NOTE — ED Triage Notes (Addendum)
 Patient BIB [employer]. Stated 1 hour ago he took 30 units of insulin  because he needs mental health help and wants to die. Seen 1 hour ago at [facility] for the same. Requesting food and drinks on arrival.

## 2023-12-06 NOTE — Discharge Instructions (Signed)
 I provided you with shelter resources.  We have given you feed in ED.  Please follow-up with shelter.

## 2023-12-06 NOTE — ED Notes (Addendum)
 Patient given orange juice, sandwich, crackers. PA Lauren aware of patients glucose.

## 2023-12-06 NOTE — ED Triage Notes (Signed)
 Pt states he just took 24 units of insulin  at 1525 because he needs help.

## 2023-12-07 ENCOUNTER — Other Ambulatory Visit: Payer: Self-pay

## 2023-12-07 ENCOUNTER — Inpatient Hospital Stay
Admission: AD | Admit: 2023-12-07 | Discharge: 2023-12-12 | DRG: 885 | Disposition: A | Discharge status: Home / Self Care | Payer: MEDICAID | Source: Intra-hospital | Attending: Psychiatry | Admitting: Psychiatry

## 2023-12-07 ENCOUNTER — Encounter (HOSPITAL_COMMUNITY): Payer: Self-pay

## 2023-12-07 ENCOUNTER — Encounter: Payer: Self-pay | Admitting: Psychiatry

## 2023-12-07 DIAGNOSIS — M19041 Primary osteoarthritis, right hand: Secondary | ICD-10-CM | POA: Diagnosis present

## 2023-12-07 DIAGNOSIS — Z7984 Long term (current) use of oral hypoglycemic drugs: Secondary | ICD-10-CM

## 2023-12-07 DIAGNOSIS — R45851 Suicidal ideations: Secondary | ICD-10-CM | POA: Diagnosis present

## 2023-12-07 DIAGNOSIS — M549 Dorsalgia, unspecified: Secondary | ICD-10-CM | POA: Diagnosis present

## 2023-12-07 DIAGNOSIS — F1914 Other psychoactive substance abuse with psychoactive substance-induced mood disorder: Secondary | ICD-10-CM | POA: Diagnosis present

## 2023-12-07 DIAGNOSIS — Z5982 Transportation insecurity: Secondary | ICD-10-CM | POA: Diagnosis not present

## 2023-12-07 DIAGNOSIS — Z83438 Family history of other disorder of lipoprotein metabolism and other lipidemia: Secondary | ICD-10-CM | POA: Diagnosis not present

## 2023-12-07 DIAGNOSIS — F332 Major depressive disorder, recurrent severe without psychotic features: Secondary | ICD-10-CM | POA: Diagnosis present

## 2023-12-07 DIAGNOSIS — M479 Spondylosis, unspecified: Secondary | ICD-10-CM | POA: Diagnosis present

## 2023-12-07 DIAGNOSIS — Z608 Other problems related to social environment: Secondary | ICD-10-CM | POA: Diagnosis present

## 2023-12-07 DIAGNOSIS — Z833 Family history of diabetes mellitus: Secondary | ICD-10-CM | POA: Diagnosis not present

## 2023-12-07 DIAGNOSIS — Z87891 Personal history of nicotine dependence: Secondary | ICD-10-CM

## 2023-12-07 DIAGNOSIS — M19042 Primary osteoarthritis, left hand: Secondary | ICD-10-CM | POA: Diagnosis present

## 2023-12-07 DIAGNOSIS — Z888 Allergy status to other drugs, medicaments and biological substances status: Secondary | ICD-10-CM | POA: Diagnosis not present

## 2023-12-07 DIAGNOSIS — E119 Type 2 diabetes mellitus without complications: Secondary | ICD-10-CM | POA: Diagnosis not present

## 2023-12-07 DIAGNOSIS — I10 Essential (primary) hypertension: Secondary | ICD-10-CM | POA: Diagnosis present

## 2023-12-07 DIAGNOSIS — K219 Gastro-esophageal reflux disease without esophagitis: Secondary | ICD-10-CM | POA: Diagnosis present

## 2023-12-07 DIAGNOSIS — G8929 Other chronic pain: Secondary | ICD-10-CM | POA: Diagnosis present

## 2023-12-07 DIAGNOSIS — Z5941 Food insecurity: Secondary | ICD-10-CM | POA: Diagnosis not present

## 2023-12-07 DIAGNOSIS — Z5902 Unsheltered homelessness: Secondary | ICD-10-CM | POA: Diagnosis not present

## 2023-12-07 DIAGNOSIS — E1140 Type 2 diabetes mellitus with diabetic neuropathy, unspecified: Secondary | ICD-10-CM | POA: Diagnosis present

## 2023-12-07 DIAGNOSIS — Z79899 Other long term (current) drug therapy: Secondary | ICD-10-CM

## 2023-12-07 DIAGNOSIS — G4700 Insomnia, unspecified: Secondary | ICD-10-CM | POA: Diagnosis present

## 2023-12-07 DIAGNOSIS — Z5948 Other specified lack of adequate food: Secondary | ICD-10-CM | POA: Diagnosis not present

## 2023-12-07 DIAGNOSIS — Z9152 Personal history of nonsuicidal self-harm: Secondary | ICD-10-CM | POA: Diagnosis not present

## 2023-12-07 DIAGNOSIS — F32A Depression, unspecified: Secondary | ICD-10-CM | POA: Diagnosis not present

## 2023-12-07 DIAGNOSIS — E7800 Pure hypercholesterolemia, unspecified: Secondary | ICD-10-CM | POA: Diagnosis present

## 2023-12-07 LAB — GLUCOSE, CAPILLARY
Glucose-Capillary: 281 mg/dL — ABNORMAL HIGH (ref 70–99)
Glucose-Capillary: 290 mg/dL — ABNORMAL HIGH (ref 70–99)
Glucose-Capillary: 210 mg/dL — ABNORMAL HIGH (ref 70–99)
Glucose-Capillary: 152 mg/dL — ABNORMAL HIGH (ref 70–99)

## 2023-12-07 MED ORDER — DULOXETINE HCL 60 MG PO CPEP
60.0000 mg | ORAL_CAPSULE | Freq: Every day | ORAL | Status: DC
  Administered 2023-12-07: 60 mg via ORAL
  Filled 2023-12-07: qty 1

## 2023-12-07 MED ORDER — MAGNESIUM HYDROXIDE 400 MG/5ML PO SUSP: 30.0000 mL | Freq: Every day | ORAL | Status: DC | PRN

## 2023-12-07 MED ORDER — INSULIN ASPART 100 UNIT/ML IJ SOLN
0.0000 [IU] | Freq: Three times a day (TID) | INTRAMUSCULAR | Status: DC
  Administered 2023-12-07 – 2023-12-08 (×2): 5 [IU] via SUBCUTANEOUS
  Administered 2023-12-08: 8 [IU] via SUBCUTANEOUS
  Administered 2023-12-08: 11 [IU] via SUBCUTANEOUS
  Administered 2023-12-09 (×2): 5 [IU] via SUBCUTANEOUS
  Administered 2023-12-09: 3 [IU] via SUBCUTANEOUS
  Administered 2023-12-10 – 2023-12-11 (×4): 5 [IU] via SUBCUTANEOUS
  Filled 2023-12-07 (×12): qty 1

## 2023-12-07 MED ORDER — ACETAMINOPHEN 325 MG PO TABS: 650.0000 mg | ORAL_TABLET | Freq: Four times a day (QID) | ORAL | Status: DC | PRN

## 2023-12-07 MED ORDER — PANTOPRAZOLE SODIUM 40 MG PO TBEC
40.0000 mg | DELAYED_RELEASE_TABLET | Freq: Every day | ORAL | Status: DC
  Administered 2023-12-08 – 2023-12-12 (×5): 40 mg via ORAL
  Filled 2023-12-07 (×5): qty 1

## 2023-12-07 MED ORDER — ALUM & MAG HYDROXIDE-SIMETH 200-200-20 MG/5ML PO SUSP: 30.0000 mL | ORAL | Status: DC | PRN

## 2023-12-07 MED ORDER — ACETAMINOPHEN 325 MG PO TABS
650.0000 mg | ORAL_TABLET | Freq: Four times a day (QID) | ORAL | Status: DC | PRN
  Administered 2023-12-09 – 2023-12-12 (×3): 650 mg via ORAL
  Filled 2023-12-07 (×3): qty 2

## 2023-12-07 MED ORDER — MAGNESIUM HYDROXIDE 400 MG/5ML PO SUSP
30.0000 mL | Freq: Every day | ORAL | Status: DC | PRN
  Administered 2023-12-10: 30 mL via ORAL
  Filled 2023-12-07: qty 30

## 2023-12-07 MED ORDER — OLANZAPINE 5 MG PO TBDP
5.0000 mg | ORAL_TABLET | Freq: Three times a day (TID) | ORAL | Status: DC | PRN
  Administered 2023-12-09 – 2023-12-11 (×3): 5 mg via ORAL
  Filled 2023-12-07 (×3): qty 1

## 2023-12-07 MED ORDER — INSULIN ASPART 100 UNIT/ML IJ SOLN
0.0000 [IU] | Freq: Every day | INTRAMUSCULAR | Status: DC
  Administered 2023-12-08 – 2023-12-10 (×2): 2 [IU] via SUBCUTANEOUS
  Filled 2023-12-07 (×2): qty 1

## 2023-12-07 MED ORDER — DULOXETINE HCL 30 MG PO CPEP
60.0000 mg | ORAL_CAPSULE | Freq: Every day | ORAL | Status: DC
  Administered 2023-12-08: 60 mg via ORAL
  Filled 2023-12-07: qty 2

## 2023-12-07 MED ORDER — PANTOPRAZOLE SODIUM 40 MG PO TBEC
40.0000 mg | DELAYED_RELEASE_TABLET | Freq: Every day | ORAL | Status: DC
  Administered 2023-12-07: 40 mg via ORAL
  Filled 2023-12-07: qty 1

## 2023-12-07 MED ORDER — METFORMIN HCL 500 MG PO TABS
500.0000 mg | ORAL_TABLET | Freq: Two times a day (BID) | ORAL | Status: DC
  Administered 2023-12-07 – 2023-12-08 (×2): 500 mg via ORAL
  Filled 2023-12-07 (×2): qty 1

## 2023-12-07 MED ORDER — GABAPENTIN 300 MG PO CAPS
300.0000 mg | ORAL_CAPSULE | Freq: Three times a day (TID) | ORAL | Status: DC
  Administered 2023-12-07 – 2023-12-12 (×14): 300 mg via ORAL
  Filled 2023-12-07 (×14): qty 1

## 2023-12-07 MED ORDER — HYDROXYZINE HCL 25 MG PO TABS: 25.0000 mg | ORAL_TABLET | Freq: Three times a day (TID) | ORAL | Status: DC | PRN

## 2023-12-07 MED ORDER — GABAPENTIN 300 MG PO CAPS: 300.0000 mg | ORAL_CAPSULE | Freq: Three times a day (TID) | ORAL | Status: DC

## 2023-12-07 MED ORDER — OLANZAPINE 10 MG IM SOLR: 5.0000 mg | Freq: Three times a day (TID) | INTRAMUSCULAR | Status: DC | PRN

## 2023-12-07 MED ORDER — TRAZODONE HCL 50 MG PO TABS
50.0000 mg | ORAL_TABLET | Freq: Every evening | ORAL | Status: DC | PRN
  Administered 2023-12-07 – 2023-12-11 (×5): 50 mg via ORAL
  Filled 2023-12-07 (×5): qty 1

## 2023-12-07 NOTE — ED Notes (Signed)
 Patient remains on unit with no acute changes noted at this time. Alert and oriented.   Interacting with peers/staff appropriately. Utilizing walker to ambulate   No current suicidal or homicidal ideation reported. No hallucinations or delusions observed.  Patient is tolerating the milieu.  Medications administered as ordered; no adverse effects noted.

## 2023-12-07 NOTE — ED Notes (Signed)
 Insulin  administered, pt offered lunch, ate lunch

## 2023-12-07 NOTE — Group Note (Signed)
 Date:  12/07/2023 Time:  9:04 PM  Group Topic/Focus:  Wrap-Up Group:   The focus of this group is to help patients review their daily goal of treatment and discuss progress on daily workbooks.    Participation Level:  Active  Participation Quality:  Appropriate  Affect:  Appropriate  Cognitive:  Appropriate  Insight: Appropriate  Engagement in Group:  Engaged  Modes of Intervention:  Discussion, Education, Socialization, and Support  Additional Comments:    David Wyatt 12/07/2023, 9:04 PM

## 2023-12-07 NOTE — Plan of Care (Signed)
   Problem: Education: Goal: Emotional status will improve Outcome: Progressing Goal: Mental status will improve Outcome: Progressing Goal: Verbalization of understanding the information provided will improve Outcome: Progressing
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
## 2023-12-07 NOTE — ED Notes (Signed)
 Pt dc to [facility] BMU, pt stable, pt tansported with safe transport. Report given to Cierra, RN

## 2023-12-07 NOTE — ED Notes (Signed)
 CBC=252

## 2023-12-07 NOTE — Group Note (Signed)
 Date:  12/07/2023 Time:  6:24 PM  Group Topic/Focus:  Healthy Communication:   The focus of this group is to discuss communication, barriers to communication, as well as healthy ways to communicate with others.    Participation Level:  Did Not Attend   David Wyatt 12/07/2023, 6:24 PM

## 2023-12-07 NOTE — Tx Team (Signed)
 Initial Treatment Plan 12/07/2023 3:38 PM David Wyatt FMW:[id number]    PATIENT STRESSORS: Financial difficulties     PATIENT STRENGTHS: Motivation for treatment/growth    PATIENT IDENTIFIED PROBLEMS: Maintain sobriety  Housing resources                   DISCHARGE CRITERIA:  Adequate post-discharge living arrangements  PRELIMINARY DISCHARGE PLAN: Placement in alternative living arrangements  PATIENT/FAMILY INVOLVEMENT: This treatment plan has been presented to and reviewed with the patient, David Wyatt.  The patient has been given the opportunity to ask questions and make suggestions.  Eleanor JAYSON [place], RN 12/07/2023, 3:38 PM

## 2023-12-07 NOTE — ED Provider Notes (Signed)
 FBC/OBS ASAP Discharge Summary  Date and Time: 12/07/2023 1:51 PM  Name: David Wyatt  MRN:  [id number]   Discharge Diagnoses:  Final diagnoses:  Suicidal ideation  MDD (major depressive disorder), recurrent severe, without psychosis (HCC)  Cocaine dependence with cocaine-induced disorder (HCC)   HPI: Per provider's assessment yesterday: 63 year old male with history of chronic pain, polysubstance abuse, GERD, diabetes, depression, substance-induced mood disorder, presenting to [facility] complaining of suicidal ideations with a plan to overdose on Insulin . States he is tired of being homeless, of not having a family.    Stay Summary: Patient was admitted to the Observation unit of the [facility], with the recommendation for overnight observation and for a possible discharge in the morning. However, on reviewing pt's chart, he presents as a high risk of danger to self due to the following risk factors: recent suicide attempt within the past few days on insulin  prior to presentation to the [facility]. Age (66), male. Low socioeconomic status; lack of social support. Homelessness. He has no protective factors.  Patient also continued to verbalize SI, continued to endorse SI, stated he would do whatever to end his life if he went back out to the streets. Stated that he has some income coming in which he can use to pay for housing, but cannot find housing, states that he is afraid that he will die anyway, it I don't end it first, if discharged. Endorses dysphoria with the way his life has turned out, verbalizes depressive symptoms including insomnia, anhedonia, poor concentration levels, decreased energy levels, feelings of hopelessness, worthlessness, and helplessness. Denies psychosis; specifically denies AVH, denies paranoia and denies delusional thinking. There are no overt signs of psychosis.  Suicide Risk Assessment  SUICIDE RISK:  Severe:  Frequent, intense, and enduring suicidal ideation, specific plan  prior to hospitalization, no subjective intent, but some objective markers of intent (i.e., choice of lethal method), the method is accessible, some limited preparatory behavior, evidence of impaired self-control, severe dysphoria/symptomatology, multiple risk factors present, and few if any protective factors, particularly a lack of social support.   Recommendations: Inpatient behavioral health hospitalization for treatment and stabilization. Referral placed for the Transitions of Care Team to Assess to possible placement into independent living type arrangements.  Total Time spent with patient: 1.5 hours  Tobacco Cessation:  A prescription for an FDA-approved tobacco cessation medication was offered at discharge and the patient refused  Current Medications:  Current Facility-Administered Medications  Medication Dose Route Frequency Provider Last Rate Last Admin   acetaminophen  (TYLENOL ) tablet 650 mg  650 mg Oral Q6H PRN Randall Starlyn HERO, NP       alum & mag hydroxide-simeth (MAALOX/MYLANTA) 200-200-20 MG/5ML suspension 30 mL  30 mL Oral Q4H PRN Randall, Veronique M, NP       DULoxetine  (CYMBALTA ) DR capsule 60 mg  60 mg Oral Daily Zamarian Scarano, NP   60 mg at 12/07/23 1213   gabapentin  (NEURONTIN ) capsule 300 mg  300 mg Oral TID Tex Drilling, NP       hydrOXYzine  (ATARAX ) tablet 25 mg  25 mg Oral TID PRN Randall Starlyn HERO, NP       insulin  aspart (novoLOG ) injection 0-15 Units  0-15 Units Subcutaneous TID WC Byungura, Veronique M, NP   8 Units at 12/07/23 1208   insulin  aspart (novoLOG ) injection 0-5 Units  0-5 Units Subcutaneous QHS Randall Starlyn HERO, NP   3 Units at 12/06/23 2204   magnesium  hydroxide (MILK OF MAGNESIA) suspension 30 mL  30 mL  Oral Daily PRN Byungura, Veronique M, NP       OLANZapine  zydis (ZYPREXA ) disintegrating tablet 5 mg  5 mg Oral TID PRN Byungura, Veronique M, NP       pantoprazole  (PROTONIX ) EC tablet 40 mg  40 mg Oral Daily Idalie Canto, NP   40  mg at 12/07/23 1213   Current Outpatient Medications  Medication Sig Dispense Refill   DULoxetine  (CYMBALTA ) 60 MG capsule Take 1 capsule (60 mg total) by mouth daily. 30 capsule 0   gabapentin  (NEURONTIN ) 300 MG capsule Take 1 capsule (300 mg total) by mouth 3 (three) times daily. 90 capsule 1   loperamide  (IMODIUM ) 2 MG capsule Take 1 capsule (2 mg total) by mouth 4 (four) times daily as needed for diarrhea or loose stools. 12 capsule 0   metFORMIN  (GLUCOPHAGE ) 500 MG tablet Take 1 tablet (500 mg total) by mouth 2 (two) times daily with a meal. 60 tablet 1   pantoprazole  (PROTONIX ) 40 MG tablet Take 1 tablet (40 mg total) by mouth daily. 30 tablet 1   traZODone  (DESYREL ) 50 MG tablet Take 50 mg by mouth at bedtime as needed for sleep. Pt couldn't remember strength, so entered lowest dose     Accu-Chek Softclix Lancets lancets Use to check blood sugar three times daily as directed 100 each 1   Blood Glucose Monitoring Suppl (ACCU-CHEK GUIDE ME) w/Device KIT Use to test blood sugar 3 times daily. 1 kit 0   glucose blood (ACCU-CHEK GUIDE TEST) test strip Use as instructed to check blood sugar three times daily 100 each 1    PTA Medications:  PTA Medications  Medication Sig   DULoxetine  (CYMBALTA ) 60 MG capsule Take 1 capsule (60 mg total) by mouth daily.   pantoprazole  (PROTONIX ) 40 MG tablet Take 1 tablet (40 mg total) by mouth daily.   gabapentin  (NEURONTIN ) 300 MG capsule Take 1 capsule (300 mg total) by mouth 3 (three) times daily.   metFORMIN  (GLUCOPHAGE ) 500 MG tablet Take 1 tablet (500 mg total) by mouth 2 (two) times daily with a meal.   loperamide  (IMODIUM ) 2 MG capsule Take 1 capsule (2 mg total) by mouth 4 (four) times daily as needed for diarrhea or loose stools.   traZODone  (DESYREL ) 50 MG tablet Take 50 mg by mouth at bedtime as needed for sleep. Pt couldn't remember strength, so entered lowest dose   Accu-Chek Softclix Lancets lancets Use to check blood sugar three times daily  as directed   Blood Glucose Monitoring Suppl (ACCU-CHEK GUIDE ME) w/Device KIT Use to test blood sugar 3 times daily.   glucose blood (ACCU-CHEK GUIDE TEST) test strip Use as instructed to check blood sugar three times daily   Facility Ordered Medications  Medication   acetaminophen  (TYLENOL ) tablet 650 mg   alum & mag hydroxide-simeth (MAALOX/MYLANTA) 200-200-20 MG/5ML suspension 30 mL   magnesium  hydroxide (MILK OF MAGNESIA) suspension 30 mL   OLANZapine  zydis (ZYPREXA ) disintegrating tablet 5 mg   hydrOXYzine  (ATARAX ) tablet 25 mg   insulin  aspart (novoLOG ) injection 0-15 Units   insulin  aspart (novoLOG ) injection 0-5 Units   gabapentin  (NEURONTIN ) capsule 300 mg   DULoxetine  (CYMBALTA ) DR capsule 60 mg   pantoprazole  (PROTONIX ) EC tablet 40 mg       12/06/2023    8:35 PM 11/19/2023   12:14 PM 10/20/2023    3:33 PM  Depression screen PHQ 2/9  Decreased Interest 2 3 1   Down, Depressed, Hopeless 2 3 3   PHQ - 2  Score 4 6 4   Altered sleeping 1 3 3   Tired, decreased energy 1 2 3   Change in appetite 0 2 3  Feeling bad or failure about yourself  1 3 1   Trouble concentrating 1 3 3   Moving slowly or fidgety/restless 1 2 3   Suicidal thoughts 1 2 3   PHQ-9 Score 10 23 23   Difficult doing work/chores Very difficult Extremely dIfficult     Flowsheet Row ED from 12/06/2023 in [facility] Most recent reading at 12/07/2023 12:46 AM ED from 12/06/2023 in [facility] Emergency Department at [facility] Most recent reading at 12/06/2023  2:17 PM ED from 12/06/2023 in [facility] Emergency Department at [facility] Most recent reading at 12/06/2023 10:47 AM  C-SSRS RISK CATEGORY High Risk High Risk High Risk    Musculoskeletal  Strength & Muscle Tone: decreased Gait & Station: unsteady-uses rolling walker Patient leans: N/A  Psychiatric Specialty Exam  Presentation  General Appearance:  Disheveled  Eye Contact: Fair  Speech: Clear and  Coherent  Speech Volume: Normal  Handedness: Right   Mood and Affect  Mood: Anxious; Depressed  Affect: Depressed   Thought Process  Thought Processes: Coherent  Descriptions of Associations:Intact  Orientation:Full (Time, Place and Person)  Thought Content:Logical  Diagnosis of Schizophrenia or Schizoaffective disorder in past: No    Hallucinations:Hallucinations: None  Ideas of Reference:None  Suicidal Thoughts:Suicidal Thoughts: Yes, Active  Homicidal Thoughts:Homicidal Thoughts: No   Sensorium  Memory: Immediate Fair  Judgment: Fair  Insight: Fair   Art therapist  Concentration: Fair  Attention Span: Fair  Recall: Fair  Fund of Knowledge: Fair  Language: Fair   Psychomotor Activity  Psychomotor Activity:Psychomotor Activity: Decreased   Assets  Assets: Resilience   Sleep  Sleep: Sleep: Poor  Estimated Sleeping Duration (Last 24 Hours): 12.50 hours  Nutritional Assessment (For OBS and FBC admissions only) Has the patient had a weight loss or gain of 10 pounds or more in the last 3 months?: No Has the patient had a decrease in food intake/or appetite?: No Does the patient have dental problems?: No Does the patient have eating habits or behaviors that may be indicators of an eating disorder including binging or inducing vomiting?: No Has the patient recently lost weight without trying?: 0 Has the patient been eating poorly because of a decreased appetite?: 0 Malnutrition Screening Tool Score: 0    Physical Exam  Physical Exam Vitals and nursing note reviewed.  Neurological:     General: No focal deficit present.     Mental Status: He is oriented to person, place, and time.    Review of Systems  Psychiatric/Behavioral:  Positive for depression, substance abuse and suicidal ideas. Negative for hallucinations and memory loss. The patient is nervous/anxious and has insomnia.   All other systems reviewed and are  negative.  Blood pressure 139/89, pulse 86, temperature 98.4 F (36.9 C), resp. rate 18, SpO2 98%. There is no height or weight on file to calculate BMI.  Demographic Factors:  Male, Low socioeconomic status, and Unemployed  Loss Factors: Decrease in vocational status, Decline in physical health, and Financial problems/change in socioeconomic status  Historical Factors: NA  Risk Reduction Factors:   NA  Continued Clinical Symptoms:  Alcohol/Substance Abuse/Dependencies More than one psychiatric diagnosis Unstable or Poor Therapeutic Relationship Previous Psychiatric Diagnoses and Treatments  Cognitive Features That Contribute To Risk:  None    Suicide Risk:  Severe:  Frequent, intense, and enduring suicidal ideation, specific  plan, no subjective intent, but some objective markers of intent (i.e., choice of lethal method), the method is accessible, some limited preparatory behavior, evidence of impaired self-control, severe dysphoria/symptomatology, multiple risk factors present, and few if any protective factors, particularly a lack of social support.  Plan Of Care/Follow-up recommendations:  Other:  Inpatient BH hospitalization with [facility] referral  Disposition: [facility] BMU  Donia Snell, NP 12/07/2023, 1:51 PM

## 2023-12-07 NOTE — ED Notes (Signed)
 Patient alert, Mood appears calm. Respirations even and unlabored. No acute distress observed.  Resting in recliner asleep. Environment secured. Poc ongoing

## 2023-12-07 NOTE — ED Notes (Signed)
 Patient resting with eyes closed. Respirations even and unlabored. No distress noted. Environment secured. Plan of care ongoing, no further concerns as of present.

## 2023-12-07 NOTE — Discharge Instructions (Addendum)
 Please transfer patient to a higher level of care at the [facility] at [facility] for treatment and stabilization of his mental status.

## 2023-12-07 NOTE — ED Notes (Signed)
 Patient presents to [facility] voluntarily via [employer] with c/o SI and homelessness. Patient is A&Ox4. Denies  current intent/thoughts to harm self/others when asked. Denies A/VH. Patient denies any physical complaints when asked. No distress noted. Support and encouragement provided. Routine safety checks conducted according to facility protocol. Encouraged patient to notify staff if thoughts of harm toward self or others arise. Endorses safety. Patient verbalized understanding and agreement. Plan of care ongoing, no further concerns as of present. Patient expresses no other needs at this time.
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
## 2023-12-08 DIAGNOSIS — F332 Major depressive disorder, recurrent severe without psychotic features: Principal | ICD-10-CM

## 2023-12-08 LAB — GLUCOSE, CAPILLARY
Glucose-Capillary: 257 mg/dL — ABNORMAL HIGH (ref 70–99)
Glucose-Capillary: 231 mg/dL — ABNORMAL HIGH (ref 70–99)
Glucose-Capillary: 301 mg/dL — ABNORMAL HIGH (ref 70–99)
Glucose-Capillary: 61 mg/dL — ABNORMAL LOW (ref 70–99)
Glucose-Capillary: 240 mg/dL — ABNORMAL HIGH (ref 70–99)

## 2023-12-08 MED ORDER — INSULIN GLARGINE 100 UNIT/ML ~~LOC~~ SOLN
10.0000 [IU] | Freq: Every day | SUBCUTANEOUS | Status: DC
Start: 2023-12-08 — End: 2023-12-11
  Administered 2023-12-08 – 2023-12-11 (×4): 10 [IU] via SUBCUTANEOUS
  Filled 2023-12-08 (×4): qty 0.1

## 2023-12-08 MED ORDER — METFORMIN HCL 500 MG PO TABS
1000.0000 mg | ORAL_TABLET | Freq: Two times a day (BID) | ORAL | Status: DC
  Administered 2023-12-08 – 2023-12-12 (×8): 1000 mg via ORAL
  Filled 2023-12-08 (×8): qty 2

## 2023-12-08 MED ORDER — DULOXETINE HCL 30 MG PO CPEP
90.0000 mg | ORAL_CAPSULE | Freq: Every day | ORAL | Status: DC
  Administered 2023-12-09 – 2023-12-12 (×4): 90 mg via ORAL
  Filled 2023-12-08 (×4): qty 3

## 2023-12-08 NOTE — Group Note (Signed)
 Date:  12/08/2023 Time:  9:00 PM  Group Topic/Focus:  Overcoming Stress:   The focus of this group is to define stress and help patients assess their triggers. Wrap-Up Group:   The focus of this group is to help patients review their daily goal of treatment and discuss progress on daily workbooks.    Participation Level:  Did Not Attend    David Wyatt 12/08/2023, 9:00 PM

## 2023-12-08 NOTE — Group Note (Signed)
 Recreation Therapy Group Note   Group Topic:Coping Skills  Group Date: 12/08/2023 Start Time: 1045 End Time: 1135 Facilitators: Celestia Jeoffrey FORBES ARTICE, CTRS Location: Craft Room  Group Description: Mind Map.  Patient was provided a blank template of a diagram with 32 blank boxes in a tiered system, branching from the center (similar to a bubble chart). LRT directed patients to label the middle of the diagram Coping Skills. LRT and patients then came up with 8 different coping skills as examples. Pt were directed to record their coping skills in the 2nd tier boxes closest to the center.  Patients would then share their coping skills with the group as LRT wrote them out. LRT gave a handout of 99 different coping skills at the end of group.   Goal Area(s) Addressed: Patients will be able to define "coping skills". Patient will identify new coping skills.  Patient will increase communication.   Affect/Mood: Appropriate   Participation Level: Non-verbal    Clinical Observations/Individualized Feedback: David Wyatt was present in group for the last 10 minutes.   Plan: Continue to engage patient in RT group sessions 2-3x/week.   [address], LRT, CTRS 12/08/2023 1:33 PM

## 2023-12-08 NOTE — [facility] Suicide Risk Assessment (Signed)
 [facility] INPATIENT:  Family/Significant Other Suicide Prevention Education  Suicide Prevention Education:  Patient Refusal for Family/Significant Other Suicide Prevention Education: The patient David Wyatt has refused to provide written consent for family/significant other to be provided Family/Significant Other Suicide Prevention Education during admission and/or prior to discharge.  Physician notified.  SPE completed with pt, as pt refused to consent to family contact. SPI pamphlet provided to pt and pt was encouraged to share information with support network, ask questions, and talk about any concerns relating to SPE. Pt denies access to guns/firearms and verbalized understanding of information provided. Mobile Crisis information also provided to pt.  Nadara JONELLE Fam 12/08/2023, 3:19 PM

## 2023-12-08 NOTE — Progress Notes (Signed)
   12/08/23 1430  Spiritual Encounters  Type of Visit Initial  Care provided to: Patient  Conversation partners present during encounter Nurse  Referral source Nurse (RN/NT/LPN);Patient request (Nurse paged Chaplain phone and said patient requested visit.)  Reason for visit Routine spiritual support  OnCall Visit Yes   Chaplain visited patient per Nurse page to phone.  Patient shared that he is worried about housing after discharge.  Patient is 30 days clean from drug use and finds meaning in his [place] faith.  Chaplain celebrated patient's sobriety and offered Scriptures and ways to regulate himself, for example, deep breathing, meditation and reading his Bible.  Chaplain and patient explored housing possibilities and found patient is willing to live in a group setting.  Chaplain shared she will send a note to Case and/or Child psychotherapist.  Chaplain encouraged patient to forgive himself for detrimental life choices, to include drug use, SI and worry/anxious thoughts. Chaplain prayed with patient and checked with staff about his glasses so he can read his Bible for comfort.    Rev. Rana M. Nicholaus, M.Div. Chaplain Resident [facility]

## 2023-12-08 NOTE — Progress Notes (Signed)
 Pt calm and pleasant during assessment denying SI/HI/AVH. Pt observed by this Clinical research associate interacting appropriately with staff and peers on the unit. Pt compliant with medication administration per MD orders. Pt given education, support, and encouragement to be active in his treatment plan. Pt being monitored Q 15 minutes for safety per unit protocol, remains safe on the unit
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
## 2023-12-08 NOTE — BH IP Treatment Plan (Signed)
 Interdisciplinary Treatment and Diagnostic Plan Update  12/08/2023 Time of Session: 10:11AM David Wyatt MRN: [id number]  Principal Diagnosis: MDD (major depressive disorder), recurrent severe, without psychosis (HCC)  Secondary Diagnoses: Principal Problem:   MDD (major depressive disorder), recurrent severe, without psychosis (HCC)   Current Medications:  Current Facility-Administered Medications  Medication Dose Route Frequency Provider Last Rate Last Admin   acetaminophen  (TYLENOL ) tablet 650 mg  650 mg Oral Q6H PRN Tex Drilling, NP       alum & mag hydroxide-simeth (MAALOX/MYLANTA) 200-200-20 MG/5ML suspension 30 mL  30 mL Oral Q4H PRN Nkwenti, Doris, NP       DULoxetine  (CYMBALTA ) DR capsule 60 mg  60 mg Oral Daily Nkwenti, Doris, NP   60 mg at 12/08/23 0800   gabapentin  (NEURONTIN ) capsule 300 mg  300 mg Oral TID Tex Drilling, NP   300 mg at 12/08/23 1208   hydrOXYzine  (ATARAX ) tablet 25 mg  25 mg Oral TID PRN Tex Drilling, NP       insulin  aspart (novoLOG ) injection 0-15 Units  0-15 Units Subcutaneous TID WC Nkwenti, Doris, NP   5 Units at 12/08/23 1209   insulin  aspart (novoLOG ) injection 0-5 Units  0-5 Units Subcutaneous QHS Nkwenti, Doris, NP       insulin  glargine (LANTUS ) injection 10 Units  10 Units Subcutaneous Daily Donnelly Mellow, MD       magnesium  hydroxide (MILK OF MAGNESIA) suspension 30 mL  30 mL Oral Daily PRN Nkwenti, Doris, NP       metFORMIN  (GLUCOPHAGE ) tablet 1,000 mg  1,000 mg Oral BID WC Jadapalle, Sree, MD       OLANZapine  (ZYPREXA ) injection 5 mg  5 mg Intramuscular TID PRN Tex Drilling, NP       OLANZapine  zydis (ZYPREXA ) disintegrating tablet 5 mg  5 mg Oral TID PRN Tex Drilling, NP       pantoprazole  (PROTONIX ) EC tablet 40 mg  40 mg Oral Daily Nkwenti, Doris, NP   40 mg at 12/08/23 0800   traZODone  (DESYREL ) tablet 50 mg  50 mg Oral QHS PRN Tex Drilling, NP   50 mg at 12/07/23 2048   PTA Medications: Medications Prior to Admission   Medication Sig Dispense Refill Last Dose/Taking   Accu-Chek Softclix Lancets lancets Use to check blood sugar three times daily as directed 100 each 1    Blood Glucose Monitoring Suppl (ACCU-CHEK GUIDE ME) w/Device KIT Use to test blood sugar 3 times daily. 1 kit 0    DULoxetine  (CYMBALTA ) 60 MG capsule Take 1 capsule (60 mg total) by mouth daily. 30 capsule 0    gabapentin  (NEURONTIN ) 300 MG capsule Take 1 capsule (300 mg total) by mouth 3 (three) times daily. 90 capsule 1    glucose blood (ACCU-CHEK GUIDE TEST) test strip Use as instructed to check blood sugar three times daily 100 each 1    loperamide  (IMODIUM ) 2 MG capsule Take 1 capsule (2 mg total) by mouth 4 (four) times daily as needed for diarrhea or loose stools. 12 capsule 0    metFORMIN  (GLUCOPHAGE ) 500 MG tablet Take 1 tablet (500 mg total) by mouth 2 (two) times daily with a meal. 60 tablet 1    pantoprazole  (PROTONIX ) 40 MG tablet Take 1 tablet (40 mg total) by mouth daily. 30 tablet 1    traZODone  (DESYREL ) 50 MG tablet Take 50 mg by mouth at bedtime as needed for sleep. Pt couldn't remember strength, so entered lowest dose  Patient Stressors: Financial difficulties    Patient Strengths: Motivation for treatment/growth   Treatment Modalities: Medication Management, Group therapy, Case management,  1 to 1 session with clinician, Psychoeducation, Recreational therapy.   Physician Treatment Plan for Primary Diagnosis: MDD (major depressive disorder), recurrent severe, without psychosis (HCC) Long Term Goal(s):     Short Term Goals:    Medication Management: Evaluate patient's response, side effects, and tolerance of medication regimen.  Therapeutic Interventions: 1 to 1 sessions, Unit Group sessions and Medication administration.  Evaluation of Outcomes: Not Met  Physician Treatment Plan for Secondary Diagnosis: Principal Problem:   MDD (major depressive disorder), recurrent severe, without psychosis (HCC)  Long  Term Goal(s):     Short Term Goals:       Medication Management: Evaluate patient's response, side effects, and tolerance of medication regimen.  Therapeutic Interventions: 1 to 1 sessions, Unit Group sessions and Medication administration.  Evaluation of Outcomes: Not Met   RN Treatment Plan for Primary Diagnosis: MDD (major depressive disorder), recurrent severe, without psychosis (HCC) Long Term Goal(s): Knowledge of disease and therapeutic regimen to maintain health will improve  Short Term Goals: Ability to demonstrate self-control, Ability to participate in decision making will improve, Ability to verbalize feelings will improve, Ability to disclose and discuss suicidal ideas, Ability to identify and develop effective coping behaviors will improve, and Compliance with prescribed medications will improve  Medication Management: RN will administer medications as ordered by provider, will assess and evaluate patient's response and provide education to patient for prescribed medication. RN will report any adverse and/or side effects to prescribing provider.  Therapeutic Interventions: 1 on 1 counseling sessions, Psychoeducation, Medication administration, Evaluate responses to treatment, Monitor vital signs and CBGs as ordered, Perform/monitor CIWA, COWS, AIMS and Fall Risk screenings as ordered, Perform wound care treatments as ordered.  Evaluation of Outcomes: Not Met   LCSW Treatment Plan for Primary Diagnosis: MDD (major depressive disorder), recurrent severe, without psychosis (HCC) Long Term Goal(s): Safe transition to appropriate next level of care at discharge, Engage patient in therapeutic group addressing interpersonal concerns.  Short Term Goals: Engage patient in aftercare planning with referrals and resources, Increase social support, Increase ability to appropriately verbalize feelings, Increase emotional regulation, Facilitate acceptance of mental health diagnosis and  concerns, Facilitate patient progression through stages of change regarding substance use diagnoses and concerns, Identify triggers associated with mental health/substance abuse issues, and Increase skills for wellness and recovery  Therapeutic Interventions: Assess for all discharge needs, 1 to 1 time with Social worker, Explore available resources and support systems, Assess for adequacy in community support network, Educate family and significant other(s) on suicide prevention, Complete Psychosocial Assessment, Interpersonal group therapy.  Evaluation of Outcomes: Not Met   Progress in Treatment: Attending groups: Yes. Participating in groups: Yes. Taking medication as prescribed: Yes. Toleration medication: Yes. Family/Significant other contact made: No, will contact:  once permission has been granted.  Patient understands diagnosis: Yes. Discussing patient identified problems/goals with staff: Yes. Medical problems stabilized or resolved: Yes. Denies suicidal/homicidal ideation: Yes. Issues/concerns per patient self-inventory: No. Other: none  New problem(s) identified: No, Describe:  none  New Short Term/Long Term Goal(s): detox, elimination of symptoms of psychosis, medication management for mood stabilization; elimination of SI thoughts; development of comprehensive mental wellness/sobriety plan.   Patient Goals:  feel better  Discharge Plan or Barriers: Patient reports that he would like assistance with housing.  He identifies housing as his biggest barrier to treatment and wellness.   Reason  for Continuation of Hospitalization: Anxiety Depression Medication stabilization Suicidal ideation  Estimated Length of Stay:  1-7 days  Last 3 [place] Suicide Severity Risk Score: Flowsheet Row Admission (Current) from 12/07/2023 in [facility] INPATIENT [facility]IORAL MEDICINE Most recent reading at 12/07/2023  2:23 PM ED from 12/06/2023 in [facility] Most  recent reading at 12/07/2023 12:46 AM ED from 12/06/2023 in [facility] Emergency Department at [facility] Most recent reading at 12/06/2023  2:17 PM  C-SSRS RISK CATEGORY High Risk High Risk High Risk    Last PHQ 2/9 Scores:    12/06/2023    8:35 PM 11/19/2023   12:14 PM 10/20/2023    3:33 PM  Depression screen PHQ 2/9  Decreased Interest 2 3 1   Down, Depressed, Hopeless 2 3 3   PHQ - 2 Score 4 6 4   Altered sleeping 1 3 3   Tired, decreased energy 1 2 3   Change in appetite 0 2 3  Feeling bad or failure about yourself  1 3 1   Trouble concentrating 1 3 3   Moving slowly or fidgety/restless 1 2 3   Suicidal thoughts 1 2 3   PHQ-9 Score 10 23 23   Difficult doing work/chores Very difficult Extremely dIfficult     Scribe for Treatment Team: Sherryle JINNY Margo, KEN 12/08/2023 12:49 PM

## 2023-12-08 NOTE — Plan of Care (Signed)
   Problem: Education: Goal: Knowledge of [facility]att [facility] Education information/materials will improve Outcome: Progressing Goal: Emotional status will improve Outcome: Progressing Goal: Mental status will improve Outcome: Progressing Goal: Verbalization of understanding the information provided will improve Outcome: Progressing   Problem: Activity: Goal: Interest or engagement in activities will improve Outcome: Progressing Goal: Sleeping patterns will improve Outcome: Progressing   Problem: Coping: Goal: Ability to verbalize frustrations and anger appropriately will improve Outcome: Progressing Goal: Ability to demonstrate self-control will improve Outcome: Progressing
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
## 2023-12-08 NOTE — Progress Notes (Signed)
 Pt glucose was 61, patient given snack, on recheck glucose was 240. Pt given 2 U per MD orders, check MAR

## 2023-12-08 NOTE — BHH Counselor (Signed)
 Adult Comprehensive Assessment  Patient ID: Abdulla Pooley, male   DOB: [birth year], 63 y.o.   MRN: [id number]  Information Source: Information source: Patient (Previous PSA from 10/23/23 encounter.)  Current Stressors:  Patient states their primary concerns and needs for treatment are:: I'm depressed. Pt acknowledged attempting suicide prior to admission. Patient states their goals for this hospitilization and ongoing recovery are:: Work on my mental health. Educational / Learning stressors: None reported Employment / Job issues: None reported Family Relationships: None reported Surveyor, quantity / Lack of resources (include bankruptcy): None reported Housing / Lack of housing: Pt is currently unhoused. Physical health (include injuries & life threatening diseases): Previously pt was concerns about his blood sugar. He mentioned that after he tried to commit suicide, prior to admission, they took away his insulin . Social relationships: None reported Substance abuse: He reported daily use of crack cocaine. Bereavement / Loss: None reported  Living/Environment/Situation:  Living Arrangements: Other (Comment) (Pt is currently unhoused.) Living conditions (as described by patient or guardian): N/A Who else lives in the home?: N/A How long has patient lived in current situation?: About a month. Laying on the ground.  Family History:  Marital status: Separated Separated, when?: 40 some years. What types of issues is patient dealing with in the relationship?: Unable to assess Additional relationship information: N/A Are you sexually active?:  (Unable to assess) What is your sexual orientation?: Unable to assess Has your sexual activity been affected by drugs, alcohol, medication, or emotional stress?: unable to assess Does patient have children?: Yes How many children?: 3 How is patient's relationship with their children?: I haven't seen them in 40 some years.  Childhood History:  By whom  was/is the patient raised?: Both parents Additional childhood history information: Pt described his childhood as beautiful. He reported being raised by his mother mostly and his father was in the picture as well. Description of patient's relationship with caregiver when they were a child: Good. Patient's description of current relationship with people who raised him/her: Both are deceased. How were you disciplined when you got in trouble as a child/adolescent?: I was disciplined very good. Does patient have siblings?: Yes Number of Siblings: 4 (Pt mentioned his sister, a brother who was killed in [place], and to boys adopted by his mother.) Description of patient's current relationship with siblings: Pt reported that he is only close with his sister but stated, she got her own family. Did patient suffer any verbal/emotional/physical/sexual abuse as a child?: No Did patient suffer from severe childhood neglect?: No Has patient ever been sexually abused/assaulted/raped as an adolescent or adult?: No Was the patient ever a victim of a crime or a disaster?: No Witnessed domestic violence?: No Has patient been affected by domestic violence as an adult?: No  Education:  Highest grade of school patient has completed: 12th grade without graduating. Currently a student?: No Learning disability?: No  Employment/Work Situation:   Employment Situation: On disability Why is Patient on Disability: Medical reasons How Long has Patient Been on Disability: 20 some years I think. Patient's Job has Been Impacted by Current Illness: No What is the Longest Time Patient has Held a Job?: 30+ years Where was the Patient Employed at that Time?: [employer] Has Patient ever Been in the [employer]?: No  Financial Resources:   Financial resources: Insurance claims handler, [place] Does patient have a Lawyer or guardian?: No  Alcohol/Substance Abuse:   What has been your use of drugs/alcohol  within the last 12 months?: Pt reported  using crack cocaine everyday, whatever he had ($40-50 daily). Alcohol/Substance Abuse Treatment Hx: Relapse prevention program If yes, describe treatment: [employer] Has alcohol/substance abuse ever caused legal problems?: No  Social Support System:   Forensic psychologist System: None Describe Community Support System: Pt denied having any support. Type of faith/religion: Sherlean How does patient's faith help to cope with current illness?: When I read the word.  Leisure/Recreation:   Do You Have Hobbies?: Yes Leisure and Hobbies: Read the word.  Strengths/Needs:   What is the patient's perception of their strengths?: Ain't too much I do. Patient states they can use these personal strengths during their treatment to contribute to their recovery: unable to assess Patient states these barriers may affect/interfere with their treatment: Pt is currently unhoused. Patient states these barriers may affect their return to the community: Pt is currently unhoused.  Discharge Plan:   Currently receiving community mental health services: No Patient states concerns and preferences for aftercare planning are: Pt open to continued mental health follow up. Reported he is currently working with crisis counselor Bobetta Babe [phone]). Patient states they will know when they are safe and ready for discharge when: When I know where I can go. Does patient have access to transportation?: No Does patient have financial barriers related to discharge medications?: No Plan for no access to transportation at discharge: CSW will assist with transportation upon discharge. Plan for living situation after discharge: Pt is currently unhoused. Will patient be returning to same living situation after discharge?: No  Summary/Recommendations:   Summary and Recommendations (to be completed by the evaluator): Patient is a 63 year old, separated,  male from [place], [place] [facility] [place]). He stated that he came into the hospital because he is depressed. Pt also reported a suicide attempt prior to admission via insulin  (per chart). He stated that they took his insulin  away from him because of his suicide attempt. Pt reported that he has been homeless for the past month as his main stressor. He shared that his goal is to work on his mental health. Pt denied any history of abuse or trauma. He reported that he has used crack cocaine daily within the past year. He reported using as much as he could which he later described as $40-50 worth. Pt denied any support system in his life. He reported that he is working with [employer] as part of his outpatient services. He is open to referral for continued outpatient mental health services.  Recommendations include: crisis stabilization, therapeutic milieu, encourage group attendance and participation, medication management for mood stabilization and development of a comprehensive mental wellness plan.  Nadara JONELLE Fam. 12/08/2023

## 2023-12-08 NOTE — Inpatient Diabetes Management (Signed)
 Inpatient Diabetes Program Recommendations  AACE/ADA: New Consensus Statement on Inpatient Glycemic Control (2015)  Target Ranges:  Prepandial:   less than 140 mg/dL      Peak postprandial:   less than 180 mg/dL (1-2 hours)      Critically ill patients:  140 - 180 mg/dL    Latest Reference Range & Units 12/07/23 08:21 12/07/23 11:47 12/07/23 16:14 12/07/23 20:04  Glucose-Capillary 70 - 99 mg/dL 718 (H)  8 units Novolog   290 (H)  8 units Novolog   210 (H)  5 units Novolog   152 (H)  (H): Data is abnormally high  Latest Reference Range & Units 12/08/23 07:03  Glucose-Capillary 70 - 99 mg/dL 742 (H)  8 units Novolog    (H): Data is abnormally high    Admit with:  Suicidal ideation  MDD (major depressive disorder), recurrent severe, without psychosis (HCC)  Cocaine dependence with cocaine-induced disorder (HCC)   History: DM, Polysubstance abuse, Homelessness  Home DM Meds: Metformin  500 mg BID  Current Orders: Novolog  Moderate Correction Scale/ SSI (0-15 units) TID AC + HS     Metformin  500 mg BID     MD- While inpatient, please consider adding Lantus  10 units daily (0.15 units/kg)  During previous hospital stays, pt required Lantus  for better CBG control  Please also increase the Metformin  to 1000 mg BID    --Will follow patient during hospitalization--  Adina Rudolpho Arrow RN, MSN, CDCES Diabetes Coordinator Inpatient Glycemic Control Team Team Pager: [phone] (8a-5p)

## 2023-12-08 NOTE — Plan of Care (Signed)
  Problem: Education: Goal: Emotional status will improve Outcome: Progressing Goal: Mental status will improve Outcome: Progressing Goal: Verbalization of understanding the information provided will improve Outcome: Progressing   Problem: Activity: Goal: Interest or engagement in activities will improve Outcome: Progressing Goal: Sleeping patterns will improve Outcome: Progressing   Problem: Coping: Goal: Ability to verbalize frustrations and anger appropriately will improve Outcome: Progressing Goal: Ability to demonstrate self-control will improve Outcome: Progressing

## 2023-12-08 NOTE — Progress Notes (Signed)
   12/08/23 1100  Psych Admission Type (Psych Patients Only)  Admission Status Voluntary  Psychosocial Assessment  Patient Complaints Confusion  Eye Contact Fair  Facial Expression Animated  Affect Appropriate to circumstance  Speech Logical/coherent  Interaction Assertive  Motor Activity Slow  Appearance/Hygiene In scrubs  Behavior Characteristics Cooperative;Appropriate to situation  Mood Pleasant  Thought Process  Coherency WDL  Content WDL  Delusions None reported or observed  Perception WDL  Hallucination None reported or observed  Judgment Impaired  Confusion None  Danger to Self  Current suicidal ideation? Denies  Agreement Not to Harm Self Yes  Description of Agreement verbal  Danger to Others  Danger to Others None reported or observed

## 2023-12-08 NOTE — Group Note (Signed)
 [facility] LCSW Group Therapy Note    Group Date: 12/08/2023 Start Time: 1300 End Time: 1340  Type of Therapy and Topic:  Group Therapy:  Overcoming Obstacles  Participation Level:  [facility] PARTICIPATION LEVEL: Did Not Attend   Description of Group:   In this group patients will be encouraged to explore what they see as obstacles to their own wellness and recovery. They will be guided to discuss their thoughts, feelings, and behaviors related to these obstacles. The group will process together ways to cope with barriers, with attention given to specific choices patients can make. Each patient will be challenged to identify changes they are motivated to make in order to overcome their obstacles. This group will be process-oriented, with patients participating in exploration of their own experiences as well as giving and receiving support and challenge from other group members.  Therapeutic Goals: 1. Patient will identify personal and current obstacles as they relate to admission. 2. Patient will identify barriers that currently interfere with their wellness or overcoming obstacles.  3. Patient will identify feelings, thought process and behaviors related to these barriers. 4. Patient will identify two changes they are willing to make to overcome these obstacles:    Summary of Patient Progress X   Therapeutic Modalities:   Cognitive Behavioral Therapy Solution Focused Therapy Motivational Interviewing Relapse Prevention Therapy   Nadara JONELLE Fam, LCSW

## 2023-12-09 LAB — GLUCOSE, CAPILLARY
Glucose-Capillary: 252 mg/dL — ABNORMAL HIGH (ref 70–99)
Glucose-Capillary: 236 mg/dL — ABNORMAL HIGH (ref 70–99)
Glucose-Capillary: 186 mg/dL — ABNORMAL HIGH (ref 70–99)
Glucose-Capillary: 241 mg/dL — ABNORMAL HIGH (ref 70–99)
Glucose-Capillary: 191 mg/dL — ABNORMAL HIGH (ref 70–99)
Glucose-Capillary: 200 mg/dL — ABNORMAL HIGH (ref 70–99)

## 2023-12-09 NOTE — Group Note (Signed)
 Date:  12/09/2023 Time:  8:42 PM  Group Topic/Focus:  Orientation:   The focus of this group is to educate the patient on the purpose and policies of crisis stabilization and provide a format to answer questions about their admission.  The group details unit policies and expectations of patients while admitted.    Participation Level:  Did Not Attend  Participation Quality:  NONE  Affect:  NONE  Cognitive:  NONE  Insight: None  Engagement in Group:  NONE  Modes of Intervention:  NONE  Additional Comments:  NONE   Tenya Araque 12/09/2023, 8:42 PM

## 2023-12-09 NOTE — Group Note (Signed)
 Date:  12/09/2023 Time:  4:47 PM  Group Topic/Focus:  Goals Group:   The focus of this group is to help patients establish daily goals to achieve during treatment and discuss how the patient can incorporate goal setting into their daily lives to aide in recovery.  Participation Level:  Did Not Attend  David Wyatt David Wyatt 12/09/2023, 4:47 PM

## 2023-12-09 NOTE — Progress Notes (Signed)
   12/09/23 1530  Psych Admission Type (Psych Patients Only)  Admission Status Voluntary  Psychosocial Assessment  Patient Complaints Anxiety;Depression;Worrying (patient is worried about being discharged without a place to go, stating I can't go back out there and sleep on the ground.)  Eye Contact Fair  Facial Expression Animated;Fixed smile  Affect Appropriate to circumstance  Speech Logical/coherent  Interaction Assertive  Motor Activity Slow  Appearance/Hygiene In scrubs  Behavior Characteristics Cooperative;Appropriate to situation  Mood Pleasant  Aggressive Behavior  Effect No apparent injury  Thought Process  Coherency WDL  Content WDL  Delusions None reported or observed  Perception WDL  Hallucination None reported or observed  Judgment WDL  Confusion None  Danger to Self  Current suicidal ideation? Denies  Agreement Not to Harm Self Yes  Description of Agreement Verbal  Danger to Others  Danger to Others None reported or observed   Patient's goal for today, per his self-inventory is memory and housing.

## 2023-12-09 NOTE — Group Note (Signed)
 [facility] LCSW Group Therapy Note   Group Date: 12/09/2023 Start Time: 1300 End Time: 1405   Type of Therapy/Topic:  Group Therapy:  Emotion Regulation  Participation Level:  Active   Mood: Appropriate   Description of Group:    The purpose of this group is to assist patients in learning to regulate negative emotions and experience positive emotions. Patients will be guided to discuss ways in which they have been vulnerable to their negative emotions. These vulnerabilities will be juxtaposed with experiences of positive emotions or situations, and patients challenged to use positive emotions to combat negative ones. Special emphasis will be placed on coping with negative emotions in conflict situations, and patients will process healthy conflict resolution skills.  Therapeutic Goals: Patient will identify two positive emotions or experiences to reflect on in order to balance out negative emotions:  Patient will label two or more emotions that they find the most difficult to experience:  Patient will be able to demonstrate positive conflict resolution skills through discussion or role plays:   Summary of Patient Progress:   During group, patient and group explored the ways in which our thoughts can impact our feelings which impacts our behaviors. Group along with facilitator completed a thermometer activity where different areas of life were explored. Participants were asked to notate in which zone these areas exist in on their personal thermometers. The group then discussed coping skills, and safety plans to help better prepare for potential stressors and learn to better emotionally regulate.     Therapeutic Modalities:   Cognitive Behavioral Therapy Feelings Identification Dialectical Behavioral Therapy   Alveta CHRISTELLA Kerns, LCSW

## 2023-12-09 NOTE — BHH Counselor (Signed)
 Referral sent to [facility] and [facility] on patient's behalf.   CSW to continue to assess.   Cady Hafen, MSW, LCSWA 12/09/2023 4:09 PM

## 2023-12-09 NOTE — Plan of Care (Signed)
   Problem: Education: Goal: Knowledge of [facility] Education information/materials will improve Outcome: Progressing Goal: Emotional status will improve Outcome: Progressing Goal: Mental status will improve Outcome: Progressing Goal: Verbalization of understanding the information provided will improve Outcome: Progressing   Problem: Activity: Goal: Interest or engagement in activities will improve Outcome: Progressing Goal: Sleeping patterns will improve Outcome: Progressing   Problem: Coping: Goal: Ability to verbalize frustrations and anger appropriately will improve Outcome: Progressing Goal: Ability to demonstrate self-control will improve Outcome: Progressing   Problem: Health Behavior/Discharge Planning: Goal: Identification of resources available to assist in meeting health care needs will improve Outcome: Progressing Goal: Compliance with treatment plan for underlying cause of condition will improve Outcome: Progressing   Problem: Physical Regulation: Goal: Ability to maintain clinical measurements within normal limits will improve Outcome: Progressing   Problem: Safety: Goal: Periods of time without injury will increase Outcome: Progressing   Problem: Education: Goal: Utilization of techniques to improve thought processes will improve Outcome: Progressing Goal: Knowledge of the prescribed therapeutic regimen will improve Outcome: Progressing   Problem: Activity: Goal: Interest or engagement in leisure activities will improve Outcome: Progressing Goal: Imbalance in normal sleep/wake cycle will improve Outcome: Progressing   Problem: Coping: Goal: Coping ability will improve Outcome: Progressing Goal: Will verbalize feelings Outcome: Progressing   Problem: Health Behavior/Discharge Planning: Goal: Ability to make decisions will improve Outcome: Progressing Goal: Compliance with therapeutic regimen will improve Outcome: Progressing    Problem: Role Relationship: Goal: Will demonstrate positive changes in social behaviors and relationships Outcome: Progressing   Problem: Safety: Goal: Ability to disclose and discuss suicidal ideas will improve Outcome: Progressing Goal: Ability to identify and utilize support systems that promote safety will improve Outcome: Progressing   Problem: Self-Concept: Goal: Will verbalize positive feelings about self Outcome: Progressing Goal: Level of anxiety will decrease Outcome: Progressing

## 2023-12-09 NOTE — Progress Notes (Signed)
 Pt calm and pleasant during assessment denying SI/HI/AVH. Pt observed by this Clinical research associate interacting appropriately with staff and peers on the unit. Pt compliant with medication administration per MD orders. Pt given education, support, and encouragement to be active in his treatment plan. Pt being monitored Q 15 minutes for safety per unit protocol, remains safe on the unit

## 2023-12-09 NOTE — Plan of Care (Signed)
   Problem: Education: Goal: Emotional status will improve Outcome: Progressing Goal: Mental status will improve Outcome: Progressing

## 2023-12-09 NOTE — Group Note (Signed)
 Recreation Therapy Group Note   Group Topic:Goal Setting  Group Date: 12/09/2023 Start Time: 1000 End Time: 1100 Facilitators: Celestia Jeoffrey BRAVO, LRT, CTRS Location: Craft Room  Group Description: Product/process development scientist. Patients were given many different magazines, a glue stick, markers, and a piece of cardstock paper. LRT and pts discussed the importance of having goals in life. LRT and pts discussed the difference between short-term and long-term goals, as well as what a SMART goal is. LRT encouraged pts to create a vision board, with images they picked and then cut out with safety scissors from the magazine, for themselves, that capture their short and long-term goals. LRT encouraged pts to show and explain their vision board to the group.   Goal Area(s) Addressed:  Patient will gain knowledge of short vs. long term goals.  Patient will identify goals for themselves. Patient will practice setting SMART goals. Patient will verbalize their goals to LRT and peers.   Affect/Mood: N/A   Participation Level: Did not attend    Clinical Observations/Individualized Feedback: Patient did not attend group.   Plan: Continue to engage patient in RT group sessions 2-3x/week.   Jeoffrey BRAVO Celestia, LRT, CTRS 12/09/2023 1:16 PM

## 2023-12-10 LAB — GLUCOSE, CAPILLARY
Glucose-Capillary: 240 mg/dL — ABNORMAL HIGH (ref 70–99)
Glucose-Capillary: 203 mg/dL — ABNORMAL HIGH (ref 70–99)
Glucose-Capillary: 206 mg/dL — ABNORMAL HIGH (ref 70–99)
Glucose-Capillary: 203 mg/dL — ABNORMAL HIGH (ref 70–99)

## 2023-12-10 MED ORDER — GLUCERNA SHAKE PO LIQD
237.0000 mL | Freq: Three times a day (TID) | ORAL | Status: DC
Start: 2023-12-10 — End: 2023-12-12
  Administered 2023-12-10 – 2023-12-12 (×4): 237 mL via ORAL

## 2023-12-10 NOTE — Progress Notes (Signed)
   12/10/23 1515  Spiritual Encounters  Type of Visit Follow up  Care provided to: Patient  Conversation partners present during encounter Social worker/Care management/TOC  Referral source Patient request  OnCall Visit No   Chaplain visited patient per page to the [facility] phone.  Chaplain followed up with patient about housing/placement.  Patient shared that he called 2 places and is waiting for a callback.  Chaplain celebrated this with patient.  Patient asked for Scriptures to keep himself encouraged and Chaplain shared some Psalms.  Patient shared he broke his glasses and Chaplain checked with staff regarding a magnifying glass and they said there is a magnifying sheet they can give the patient.  Patient was happy to hear that.  Chaplain and patient prayed together at patient's request.    Rev. Rana M. Nicholaus, M.Div. Chaplain Resident [facility]

## 2023-12-10 NOTE — Progress Notes (Addendum)
 Patient requested Chaplain services. This Clinical research associate paged the on-call Chaplain.

## 2023-12-10 NOTE — Progress Notes (Signed)
   12/10/23 1500  Psych Admission Type (Psych Patients Only)  Admission Status Voluntary  Psychosocial Assessment  Patient Complaints Anxiety;Depression;Hopelessness (patient is worried about housing and being homeless; states I try not to think about it.)  Eye Contact Fair  Facial Expression Animated;Fixed smile  Affect Appropriate to circumstance  Speech Logical/coherent  Interaction Assertive  Motor Activity Slow  Appearance/Hygiene In scrubs  Behavior Characteristics Cooperative;Appropriate to situation  Mood Pleasant  Aggressive Behavior  Effect No apparent injury  Thought Process  Coherency WDL  Content WDL  Delusions None reported or observed  Perception WDL  Hallucination None reported or observed  Judgment WDL  Confusion None  Danger to Self  Current suicidal ideation? Denies  Agreement Not to Harm Self Yes  Description of Agreement Verbal  Danger to Others  Danger to Others None reported or observed

## 2023-12-10 NOTE — Plan of Care (Signed)
   Problem: Education: Goal: Knowledge of [facility] Education information/materials will improve Outcome: Progressing Goal: Emotional status will improve Outcome: Progressing Goal: Mental status will improve Outcome: Progressing Goal: Verbalization of understanding the information provided will improve Outcome: Progressing   Problem: Activity: Goal: Interest or engagement in activities will improve Outcome: Progressing Goal: Sleeping patterns will improve Outcome: Progressing   Problem: Coping: Goal: Ability to verbalize frustrations and anger appropriately will improve Outcome: Progressing Goal: Ability to demonstrate self-control will improve Outcome: Progressing   Problem: Health Behavior/Discharge Planning: Goal: Identification of resources available to assist in meeting health care needs will improve Outcome: Progressing Goal: Compliance with treatment plan for underlying cause of condition will improve Outcome: Progressing   Problem: Physical Regulation: Goal: Ability to maintain clinical measurements within normal limits will improve Outcome: Progressing   Problem: Safety: Goal: Periods of time without injury will increase Outcome: Progressing   Problem: Education: Goal: Utilization of techniques to improve thought processes will improve Outcome: Progressing Goal: Knowledge of the prescribed therapeutic regimen will improve Outcome: Progressing   Problem: Activity: Goal: Interest or engagement in leisure activities will improve Outcome: Progressing Goal: Imbalance in normal sleep/wake cycle will improve Outcome: Progressing   Problem: Coping: Goal: Coping ability will improve Outcome: Progressing Goal: Will verbalize feelings Outcome: Progressing   Problem: Health Behavior/Discharge Planning: Goal: Ability to make decisions will improve Outcome: Progressing Goal: Compliance with therapeutic regimen will improve Outcome: Progressing    Problem: Role Relationship: Goal: Will demonstrate positive changes in social behaviors and relationships Outcome: Progressing   Problem: Safety: Goal: Ability to disclose and discuss suicidal ideas will improve Outcome: Progressing Goal: Ability to identify and utilize support systems that promote safety will improve Outcome: Progressing   Problem: Self-Concept: Goal: Will verbalize positive feelings about self Outcome: Progressing Goal: Level of anxiety will decrease Outcome: Progressing

## 2023-12-10 NOTE — Group Note (Signed)
 [facility] LCSW Group Therapy Note   Group Date: 12/10/2023 Start Time: 1300 End Time: 1350   Type of Therapy/Topic:  Group Therapy:  Emotion Regulation  Participation Level:  Did Not Attend    Description of Group:    The purpose of this group is to assist patients in learning to regulate negative emotions and experience positive emotions. Patients will be guided to discuss ways in which they have been vulnerable to their negative emotions. These vulnerabilities will be juxtaposed with experiences of positive emotions or situations, and patients challenged to use positive emotions to combat negative ones. Special emphasis will be placed on coping with negative emotions in conflict situations, and patients will process healthy conflict resolution skills.  Therapeutic Goals: Patient will identify two positive emotions or experiences to reflect on in order to balance out negative emotions:  Patient will label two or more emotions that they find the most difficult to experience:  Patient will be able to demonstrate positive conflict resolution skills through discussion or role plays:   Summary of Patient Progress: Patient did not attend group.    Therapeutic Modalities:   Cognitive Behavioral Therapy Feelings Identification Dialectical Behavioral Therapy   Nadara JONELLE Fam, LCSW

## 2023-12-10 NOTE — BHH Counselor (Signed)
 CSW provided patient with [facility] contact information and patient conducted phone screening with owner, Mr. Ula.   Patient has been offered a room at the [facility], [place] location and the patient has accepted.   This has been communicated with team to continue to engage in safe discharge planning.   CSW to continue to assess.   Calixto Pavel, MSW, LCSWA 12/10/2023 4:25 PM

## 2023-12-11 LAB — GLUCOSE, CAPILLARY
Glucose-Capillary: 226 mg/dL — ABNORMAL HIGH (ref 70–99)
Glucose-Capillary: 242 mg/dL — ABNORMAL HIGH (ref 70–99)
Glucose-Capillary: 308 mg/dL — ABNORMAL HIGH (ref 70–99)
Glucose-Capillary: 70 mg/dL (ref 70–99)
Glucose-Capillary: 174 mg/dL — ABNORMAL HIGH (ref 70–99)

## 2023-12-11 MED ORDER — INSULIN ASPART 100 UNIT/ML IJ SOLN: 3.0000 [IU] | Freq: Three times a day (TID) | INTRAMUSCULAR | Status: DC

## 2023-12-11 MED ORDER — INSULIN ASPART 100 UNIT/ML IJ SOLN: 0.0000 [IU] | Freq: Every day | INTRAMUSCULAR | Status: DC

## 2023-12-11 MED ORDER — INSULIN ASPART 100 UNIT/ML IJ SOLN
3.0000 [IU] | Freq: Three times a day (TID) | INTRAMUSCULAR | Status: DC
  Administered 2023-12-11 – 2023-12-12 (×3): 3 [IU] via SUBCUTANEOUS
  Filled 2023-12-11 (×3): qty 1

## 2023-12-11 MED ORDER — INSULIN ASPART 100 UNIT/ML IJ SOLN
0.0000 [IU] | Freq: Three times a day (TID) | INTRAMUSCULAR | Status: DC
  Administered 2023-12-11: 5 [IU] via SUBCUTANEOUS
  Administered 2023-12-11: 7 [IU] via SUBCUTANEOUS
  Administered 2023-12-12: 2 [IU] via SUBCUTANEOUS
  Filled 2023-12-11 (×3): qty 1

## 2023-12-11 MED ORDER — INSULIN ASPART 100 UNIT/ML IJ SOLN: 0.0000 [IU] | Freq: Three times a day (TID) | INTRAMUSCULAR | Status: DC

## 2023-12-11 MED ORDER — INSULIN GLARGINE 100 UNIT/ML ~~LOC~~ SOLN
12.0000 [IU] | Freq: Every day | SUBCUTANEOUS | Status: DC
Start: 2023-12-11 — End: 2023-12-12
  Administered 2023-12-11: 12 [IU] via SUBCUTANEOUS
  Filled 2023-12-11: qty 0.12

## 2023-12-11 NOTE — Group Note (Signed)
 Date:  12/11/2023 Time:  1:37 AM  Group Topic/Focus:  Wrap-Up Group:   The focus of this group is to help patients review their daily goal of treatment and discuss progress on daily workbooks.    Participation Level:  Active  Participation Quality:  Appropriate  Affect:  Appropriate  Cognitive:  Appropriate  Insight: Good  Engagement in Group:  Engaged  Modes of Intervention:  Activity  Additional Comments:    Kristen H Wylodean Shimmel 12/11/2023, 1:37 AM

## 2023-12-11 NOTE — B[facility] Counselor (Signed)
 CSW engaged patient in safe discharge planning. Patient is unsure of [facility] after being accepted due to the monthly payment. Mr. Ula, director of the program lowered the monthly payment to make rent more accessible for patient after patient expressed initial interest. Patient reported I want to see what [facility] says.   CSW touched base with [facility]. [facility] reported that they are still awaiting a clinical decision on the patient's referral.   CSW touched base with [facility] who reported that staffing is short so they are still assessing. [facility] reported they will touch base with patient once a decision is reached.   This has been communicated to patient.   CSW to cotinine to assess.   David Wyatt, MSW, LCSWA 12/11/2023 1:20 PM

## 2023-12-11 NOTE — Inpatient Diabetes Management (Signed)
 Inpatient Diabetes Program Recommendations  AACE/ADA: New Consensus Statement on Inpatient Glycemic Control (2015)  Target Ranges:  Prepandial:   less than 140 mg/dL      Peak postprandial:   less than 180 mg/dL (1-2 hours)      Critically ill patients:  140 - 180 mg/dL   Lab Results  Component Value Date   GLUCAP 226 (H) 12/11/2023   HGBA1C 12.4 (H) 12/06/2023    Latest Reference Range & Units 12/09/23 16:25 12/09/23 19:32 12/09/23 20:48 12/10/23 07:49 12/10/23 11:26 12/10/23 16:41 12/10/23 20:09 12/11/23 07:47  Glucose-Capillary 70 - 99 mg/dL 758 (H) 808 (H) 799 (H) 240 (H) 203 (H) 206 (H) 203 (H) 226 (H)  (H): Data is abnormally high  Admit with:  Suicidal ideation  MDD (major depressive disorder), recurrent severe, without psychosis (HCC)  Cocaine dependence with cocaine-induced disorder (HCC)    History: DM, Polysubstance abuse, Homelessness   Home DM Meds: Metformin  500 mg BID   Current Orders: Novolog  Moderate Correction  Lantus  10 units daily Scale/ SSI (0-15 units) TID AC + HS                            Metformin  500 mg BID  Inpatient Diabetes Program Recommendations:   Please consider: -Increase Lantus  to 12 units. -Add Novolog  3 units tid meal coverage if eats 50% meal -Decrease Novolog  correction to 0-9 units tid, 0-5 units hs  Patient was taken off of insulin  on discharge 11/29/23 due to patient has no where to store insulin -homeless.  Thank you, Kristia Jupiter E. Kao Berkheimer, RN, MSN, CNS, CDCES  Diabetes Coordinator Inpatient Glycemic Control Team Team Pager [phone] (8am-5pm) 12/11/2023 11:36 AM

## 2023-12-11 NOTE — Group Note (Signed)
 Date:  12/11/2023 Time:  1:41 PM  Group Topic/Focus:  Goals Group:   The focus of this group is to help patients establish daily goals to achieve during treatment and discuss how the patient can incorporate goal setting into their daily lives to aide in recovery.    Participation Level:  Did Not Attend   Skippy LITTIE Bennett 12/11/2023, 1:41 PM

## 2023-12-11 NOTE — Group Note (Signed)
 Date:  12/11/2023 Time:  4:36 PM  Group Topic/Focus:  Wellness Toolbox:   The focus of this group is to discuss various aspects of wellness, balancing those aspects and exploring ways to increase the ability to experience wellness.  Patients will create a wellness toolbox for use upon discharge.    Participation Level:  Did Not Attend   Deitra Clap [facility] 12/11/2023, 4:36 PM

## 2023-12-11 NOTE — Group Note (Signed)
 Recreation Therapy Group Note   Group Topic:Health and Wellness  Group Date: 12/11/2023 Start Time: 1000 End Time: 1055 Facilitators: Celestia Jeoffrey BRAVO, LRT, CTRS Location: Courtyard  Group Description: [employer]. LRT and patients played games of basketball, drew with chalk, and played corn hole while outside in the courtyard while getting fresh air and sunlight. Music was being played in the background. LRT and peers conversed about different games they have played before, what they do in their free time and anything else that is on their minds. LRT encouraged pts to drink water after being outside, sweating and getting their heart rate up.  Goal Area(s) Addressed: Patient will build on frustration tolerance skills. Patients will partake in a competitive play game with peers. Patients will gain knowledge of new leisure interest/hobby.    Affect/Mood: N/A   Participation Level: Did not attend    Clinical Observations/Individualized Feedback: Patient did not attend group.   Plan: Continue to engage patient in RT group sessions 2-3x/week.   Jeoffrey BRAVO Celestia, LRT, CTRS 12/11/2023 1:04 PM

## 2023-12-11 NOTE — BHH Counselor (Signed)
 CSW touched base with [facility] to check for an update. [facility] reported that they still do not have eached a decision on the patient's referral.   CSW touched base with [facility] who reported the same.   Patient reported that with this, he will go to [facility] sober living house.   CSW to continue to assess.   David Wyatt, MSW, LCSWA 12/11/2023 3:14 PM

## 2023-12-11 NOTE — Plan of Care (Signed)
  Problem: Education: Goal: Emotional status will improve Outcome: Progressing   Problem: Education: Goal: Mental status will improve Outcome: Progressing   Problem: Activity: Goal: Interest or engagement in activities will improve Outcome: Progressing   Problem: Coping: Goal: Ability to verbalize frustrations and anger appropriately will improve Outcome: Progressing   Problem: Physical Regulation: Goal: Ability to maintain clinical measurements within normal limits will improve Outcome: Progressing   Problem: Coping: Goal: Coping ability will improve Outcome: Progressing   Problem: Health Behavior/Discharge Planning: Goal: Ability to make decisions will improve Outcome: Progressing

## 2023-12-11 NOTE — Group Note (Signed)
 LCSW Group Therapy Note  Group Date: 12/11/2023 Start Time: 1315 End Time: 1400   Type of Therapy and Topic:  Group Therapy - How To Cope with Nervousness about Discharge   Participation Level:  Minimal   Description of Group This process group involved identification of patients' feelings about discharge. Some of them are scheduled to be discharged soon, while others are new admissions, but each of them was asked to share thoughts and feelings surrounding discharge from the hospital. One common theme was that they are excited at the prospect of going home, while another was that many of them are apprehensive about sharing why they were hospitalized. Patients were given the opportunity to discuss these feelings with their peers in preparation for discharge.  Therapeutic Goals  Patient will identify their overall feelings about pending discharge. Patient will think about how they might proactively address issues that they believe will once again arise once they get home (i.e. with parents). Patients will participate in discussion about having hope for change.   Summary of Patient Progress:   Patient was active throughout the session. He demonstrated poor insight into the subject matter, and proved open to input from peers and feedback from CSW. He was respectful of peers and participated throughout the entire session.   Therapeutic Modalities Cognitive Behavioral Therapy   Sherryle JINNY Margo, LCSW 12/11/2023  3:16 PM

## 2023-12-11 NOTE — Progress Notes (Signed)
   12/11/23 0900  Psych Admission Type (Psych Patients Only)  Admission Status Voluntary  Psychosocial Assessment  Patient Complaints Anxiety;Depression  Eye Contact Other (Comment) (good eye contact)  Facial Expression Animated  Affect Appropriate to circumstance  Speech Logical/coherent  Interaction Assertive  Motor Activity Slow  Appearance/Hygiene In scrubs  Behavior Characteristics Cooperative;Appropriate to situation  Mood Pleasant  Aggressive Behavior  Effect No apparent injury  Thought Process  Coherency WDL  Content WDL  Delusions None reported or observed  Perception WDL  Hallucination None reported or observed  Judgment WDL  Confusion WDL  Danger to Self  Current suicidal ideation? Denies  Agreement Not to Harm Self Yes  Description of Agreement Verbal  Danger to Others  Danger to Others None reported or observed

## 2023-12-12 ENCOUNTER — Other Ambulatory Visit (HOSPITAL_COMMUNITY): Payer: Self-pay

## 2023-12-12 LAB — GLUCOSE, CAPILLARY
Glucose-Capillary: 178 mg/dL — ABNORMAL HIGH (ref 70–99)
Glucose-Capillary: 324 mg/dL — ABNORMAL HIGH (ref 70–99)

## 2023-12-12 MED ORDER — GABAPENTIN 300 MG PO CAPS: 300.0000 mg | ORAL_CAPSULE | Freq: Three times a day (TID) | ORAL | 0 refills | Status: DC

## 2023-12-12 MED ORDER — INSULIN GLARGINE 100 UNIT/ML ~~LOC~~ SOLN: 12.0000 [IU] | Freq: Every day | SUBCUTANEOUS | 11 refills | Status: DC

## 2023-12-12 MED ORDER — TRAZODONE HCL 50 MG PO TABS: 50.0000 mg | ORAL_TABLET | Freq: Every evening | ORAL | 0 refills | Status: DC | PRN

## 2023-12-12 MED ORDER — DULOXETINE HCL 30 MG PO CPEP: 90.0000 mg | ORAL_CAPSULE | Freq: Every day | ORAL | 0 refills | Status: DC

## 2023-12-12 MED ORDER — METFORMIN HCL 1000 MG PO TABS: 1000.0000 mg | ORAL_TABLET | Freq: Two times a day (BID) | ORAL | 0 refills | Status: DC

## 2023-12-12 MED ORDER — INSULIN ASPART 100 UNIT/ML IJ SOLN: 3.0000 [IU] | Freq: Three times a day (TID) | INTRAMUSCULAR | 11 refills | Status: DC

## 2023-12-12 NOTE — Progress Notes (Signed)
 Patient requested to have his blood sugar checked, reporting feelings of shakiness. Patient's CBG was 324.

## 2023-12-12 NOTE — Progress Notes (Signed)
  [facility] Adult Case Management Discharge Plan :  Will you be returning to the same living situation after discharge:  No. At discharge, do you have transportation home?: Yes,  CSW to assist with transportation needs.  Do you have the ability to pay for your medications: Yes,   [employer]ED [employer]AN / [employer] TAIL[employer]ED PLAN  Release of information consent forms completed and in the chart;  Patient's signature needed at discharge.  Patient to Follow up at:  Follow-up Information     [facility]. Go to.   Why: Open access walk-in services (last walk-in taken at 3 pm) during business hours: Monday to Friday, 8:00 am - 5:00 pm (office closed 12:00-1:00pm). Contact information: [address]  [address] [place] [address] [phone]                 Next level of care provider has access to [facility] Link:no  Safety Planning and Suicide Prevention discussed: Yes,  SPE completed with the patient and      Has patient been referred to the Quitline?: Patient refused referral for treatment  Patient has been referred for addiction treatment: Yes, the patient will follow up with an outpatient provider for substance use disorder. Therapist: appointment made  Sherryle JINNY Margo, LCSW 12/12/2023, 9:24 AM

## 2023-12-12 NOTE — Plan of Care (Signed)
  Problem: Education: Goal: Knowledge of [facility] Education information/materials will improve Outcome: Adequate for Discharge Goal: Emotional status will improve Outcome: Adequate for Discharge Goal: Mental status will improve Outcome: Adequate for Discharge Goal: Verbalization of understanding the information provided will improve Outcome: Adequate for Discharge   Problem: Activity: Goal: Interest or engagement in activities will improve Outcome: Adequate for Discharge Goal: Sleeping patterns will improve Outcome: Adequate for Discharge   Problem: Coping: Goal: Ability to verbalize frustrations and anger appropriately will improve Outcome: Adequate for Discharge Goal: Ability to demonstrate self-control will improve Outcome: Adequate for Discharge   Problem: Health Behavior/Discharge Planning: Goal: Identification of resources available to assist in meeting health care needs will improve Outcome: Adequate for Discharge Goal: Compliance with treatment plan for underlying cause of condition will improve Outcome: Adequate for Discharge   Problem: Physical Regulation: Goal: Ability to maintain clinical measurements within normal limits will improve Outcome: Adequate for Discharge   Problem: Safety: Goal: Periods of time without injury will increase Outcome: Adequate for Discharge   Problem: Education: Goal: Utilization of techniques to improve thought processes will improve Outcome: Adequate for Discharge Goal: Knowledge of the prescribed therapeutic regimen will improve Outcome: Adequate for Discharge   Problem: Activity: Goal: Interest or engagement in leisure activities will improve Outcome: Adequate for Discharge Goal: Imbalance in normal sleep/wake cycle will improve Outcome: Adequate for Discharge   Problem: Coping: Goal: Coping ability will improve Outcome: Adequate for Discharge Goal: Will verbalize feelings Outcome: Adequate for Discharge    Problem: Health Behavior/Discharge Planning: Goal: Ability to make decisions will improve Outcome: Adequate for Discharge Goal: Compliance with therapeutic regimen will improve Outcome: Adequate for Discharge   Problem: Role Relationship: Goal: Will demonstrate positive changes in social behaviors and relationships Outcome: Adequate for Discharge   Problem: Safety: Goal: Ability to disclose and discuss suicidal ideas will improve Outcome: Adequate for Discharge Goal: Ability to identify and utilize support systems that promote safety will improve Outcome: Adequate for Discharge   Problem: Self-Concept: Goal: Will verbalize positive feelings about self Outcome: Adequate for Discharge Goal: Level of anxiety will decrease Outcome: Adequate for Discharge   Problem: Education: Goal: Ability to describe self-care measures that may prevent or decrease complications (Diabetes Survival Skills Education) will improve Outcome: Adequate for Discharge Goal: Individualized Educational Video(s) Outcome: Adequate for Discharge   Problem: Coping: Goal: Ability to adjust to condition or change in health will improve Outcome: Adequate for Discharge   Problem: Fluid Volume: Goal: Ability to maintain a balanced intake and output will improve Outcome: Adequate for Discharge   Problem: Health Behavior/Discharge Planning: Goal: Ability to identify and utilize available resources and services will improve Outcome: Adequate for Discharge Goal: Ability to manage health-related needs will improve Outcome: Adequate for Discharge   Problem: Metabolic: Goal: Ability to maintain appropriate glucose levels will improve Outcome: Adequate for Discharge   Problem: Nutritional: Goal: Maintenance of adequate nutrition will improve Outcome: Adequate for Discharge Goal: Progress toward achieving an optimal weight will improve Outcome: Adequate for Discharge   Problem: Skin Integrity: Goal: Risk for  impaired skin integrity will decrease Outcome: Adequate for Discharge   Problem: Tissue Perfusion: Goal: Adequacy of tissue perfusion will improve Outcome: Adequate for Discharge

## 2023-12-12 NOTE — Progress Notes (Signed)
   12/12/23 0900  Psych Admission Type (Psych Patients Only)  Admission Status Voluntary  Psychosocial Assessment  Patient Complaints Worrying  Eye Contact Fair  Facial Expression Worried  Affect Appropriate to circumstance  Speech Logical/coherent  Interaction Assertive  Motor Activity Slow  Appearance/Hygiene Poor hygiene;In scrubs  Behavior Characteristics Cooperative;Appropriate to situation  Mood Pleasant  Aggressive Behavior  Effect No apparent injury  Thought Process  Coherency WDL  Content WDL  Delusions None reported or observed  Perception WDL  Hallucination None reported or observed  Judgment WDL  Confusion None  Danger to Self  Current suicidal ideation? Denies  Agreement Not to Harm Self Yes  Description of Agreement Verbal  Danger to Others  Danger to Others None reported or observed   Patient was anxious/worried about being discharged to the next facility and getting there on time.

## 2023-12-12 NOTE — Progress Notes (Signed)
   12/12/23 0600  Psych Admission Type (Psych Patients Only)  Admission Status Voluntary  Psychosocial Assessment  Patient Complaints Anxiety  Eye Contact Fair  Facial Expression Animated  Affect Appropriate to circumstance  Speech Logical/coherent  Interaction Assertive  Motor Activity Slow  Appearance/Hygiene In scrubs  Behavior Characteristics Appropriate to situation;Cooperative  Mood Pleasant  Aggressive Behavior  Effect No apparent injury  Thought Process  Coherency WDL  Content WDL  Delusions None reported or observed  Perception WDL  Hallucination None reported or observed  Judgment WDL  Confusion WDL  Danger to Self  Current suicidal ideation? Denies  Agreement Not to Harm Self Yes  Danger to Others  Danger to Others None reported or observed

## 2023-12-12 NOTE — Progress Notes (Signed)
 Patient ID: David Wyatt, male   DOB: [birth year], 63 y.o.   MRN: [id number]  Discharge Note:  Patient denies SI/HI/AVH at this time. Discharge instructions, AVS, prescriptions, and transition record gone over with patient. Patient declined on completing a Suicide Safety Plan. Patient agrees to comply with medication management, follow-up visit, and outpatient therapy. Patient belongings returned to patient. Patient questions and concerns addressed and answered. Patient ambulatory off unit. [facility] discharged to [facility] via [employer].

## 2023-12-12 NOTE — Plan of Care (Signed)
   Problem: Education: Goal: Knowledge of [facility] Education information/materials will improve Outcome: Progressing Goal: Emotional status will improve Outcome: Progressing Goal: Mental status will improve Outcome: Progressing Goal: Verbalization of understanding the information provided will improve Outcome: Progressing

## 2023-12-12 NOTE — BHH Suicide Risk Assessment (Signed)
 [facility] Discharge Suicide Risk Assessment   Principal Problem: MDD (major depressive disorder), recurrent severe, without psychosis (HCC) Discharge Diagnoses: Principal Problem:   MDD (major depressive disorder), recurrent severe, without psychosis (HCC)   Total Time spent with patient: 30 minutes  Musculoskeletal: Strength & Muscle Tone: decreased Gait & Station: unsteady Patient leans: N/A  Psychiatric Specialty Exam  Presentation  General Appearance:  Appropriate for Environment; Casual  Eye Contact: Fair  Speech: Clear and Coherent  Speech Volume: Normal  Handedness: Right   Mood and Affect  Mood: Euthymic  Duration of Depression Symptoms: Greater than two weeks  Affect: Appropriate   Thought Process  Thought Processes: Coherent  Descriptions of Associations:Intact  Orientation:Full (Time, Place and Person)  Thought Content:Logical  History of Schizophrenia/Schizoaffective disorder:No  Duration of Psychotic Symptoms:No data recorded Hallucinations:Hallucinations: None  Ideas of Reference:None  Suicidal Thoughts:Suicidal Thoughts: No  Homicidal Thoughts:Homicidal Thoughts: No   Sensorium  Memory: Immediate Fair; Recent Fair  Judgment: Fair  Insight: Fair   Art therapist  Concentration: Fair  Attention Span: Fair  Recall: Fair  Fund of Knowledge: Fair  Language: Fair   Psychomotor Activity  Psychomotor Activity: Psychomotor Activity: Normal   Assets  Assets: Communication Skills   Sleep  Sleep: Sleep: Fair  Estimated Sleeping Duration (Last 24 Hours): 7.50-9.25 hours  Physical Exam: Physical Exam ROS Blood pressure 120/78, pulse (!) 103, temperature 97.6 F (36.4 C), resp. rate 18, height 5' 8 (1.727 m), weight 70.5 kg, SpO2 99%. Body mass index is 23.64 kg/m.  Mental Status Per Nursing Assessment::   On Admission:  NA  Demographic Factors:  Low socioeconomic status  Loss  Factors: Decrease in vocational status  Historical Factors: Impulsivity  Risk Reduction Factors:   Living with another person, especially a relative, Positive social support, Positive therapeutic relationship, and Positive coping skills or problem solving skills  Continued Clinical Symptoms:  Depression:   Impulsivity  Cognitive Features That Contribute To Risk:  None    Suicide Risk:  Minimal: No identifiable suicidal ideation.  Patients presenting with no risk factors but with morbid ruminations; may be classified as minimal risk based on the severity of the depressive symptoms   Follow-up Information     [facility]. Go to.   Why: Open access walk-in services (last walk-in taken at 3 pm) during business hours: Monday to Friday, 8:00 am - 5:00 pm (office closed 12:00-1:00pm). Contact information: [address]  [address] [place] [address] [phone]                 Plan Of Care/Follow-up recommendations:  Activity:  As tolerated  Allyn Foil, MD 12/12/2023, 9:30 AM

## 2023-12-15 ENCOUNTER — Telehealth: Payer: Self-pay | Admitting: *Deleted

## 2023-12-15 NOTE — Transitions of Care (Post Inpatient/ED Visit) (Signed)
   12/15/2023  Name: Savir Blanke MRN: [id number] DOB: [birth year]  Today's TOC FU Call Status: Today's TOC FU Call Status:: Successful TOC FU Call Completed TOC FU Call Complete Date: 12/15/23 Patient's Name and Date of Birth confirmed.  Transition Care Management Follow-up Telephone Call Date of Discharge: 12/12/23 Discharge Facility: [facility]) Type of Discharge: Inpatient Admission Primary Inpatient Discharge Diagnosis:: MDD How have you been since you were released from the hospital?:  (I am lost, I don't know what is going on) Any questions or concerns?: Yes Patient Questions/Concerns:: Patient states something is wrong with me, I need to be seen. Something is wrong with my head Patient Questions/Concerns Addressed: Other: (RNCM advised [facility] walk in services Monday-Friday 8-5pm)  Items Reviewed: Did you receive and understand the discharge instructions provided?: Yes Medications obtained,verified, and reconciled?: No Medications Not Reviewed Reasons:: Other: (Patient unable to review medications I need to get off of here and clear my head)  Medications Reviewed Today: Medications Reviewed Today   Medications were not reviewed in this encounter     Home Care and Equipment/Supplies: Were Home Health Services Ordered?: No Any new equipment or medical supplies ordered?: No  Functional Questionnaire: Do you need assistance with bathing/showering or dressing?: No Do you need assistance with meal preparation?: Yes Do you need assistance with eating?: No Do you have difficulty maintaining continence: No Do you need assistance with getting out of bed/getting out of a chair/moving?: No Do you have difficulty managing or taking your medications?: Yes  Follow up appointments reviewed:    Patient confused. Reports being at [address], shares that [facility] assisted patient with finding housing. Patient reports multiple times something is  wrong with me, I need to be seen. Something is wrong with my head. RNCM reviewed walk in services with [facility]. Patient reports issues with transportation. Discussed transportation provided by [facility]. RNCM attempted to reach [facility] with [employer], unable to leave a message. RNCM advised patient to call 911 if he felt unsafe. Patient requested to end the call stating, I need to lay down and get my thoughts together.   Andrea Dimes RN, BSN [place]  [facility] RN Care Manager [phone]

## 2023-12-15 NOTE — Discharge Summary (Signed)
 Physician Discharge Summary Note  Patient:  David Wyatt is an 63 y.o., male MRN:  [id number] DOB:  January 16, 1961 Patient phone:  [phone] (home)  Patient address:   [address] [place] [address],   Total time spent: 40 min Date of Admission:  12/07/2023 Date of Discharge: 12/12/2023  Reason for Admission:  Suicidal ideation  Principal Problem: MDD (major depressive disorder), recurrent severe, without psychosis (HCC) Discharge Diagnoses: Principal Problem:   MDD (major depressive disorder), recurrent severe, without psychosis (HCC)   Past Psychiatric History: Polysubstance abuse   Family Psychiatric  History: Unknown  Social History:  Social History   Substance and Sexual Activity  Alcohol Use Not Currently     Social History   Substance and Sexual Activity  Drug Use Not Currently    Social History   Socioeconomic History   Marital status: Divorced    Spouse name: Not on file   Number of children: 3   Years of education: 12   Highest education level: Not on file  Occupational History    Comment: disabled  Tobacco Use   Smoking status: Former    Current packs/day: 0.25    Average packs/day: 0.3 packs/day for 30.0 years (7.5 ttl pk-yrs)    Types: Cigarettes   Smokeless tobacco: Former    Quit date: 08/15/2015   Tobacco comments:    04/29/16 2  cigs daily, 10/27/17 sometimes < .25 PPD  Vaping Use   Vaping status: Never Used  Substance and Sexual Activity   Alcohol use: Not Currently   Drug use: Not Currently   Sexual activity: Not Currently  Other Topics Concern   Not on file  Social History Narrative   04/29/17   Lives in shelter   Caffeine- a lot of  tea, coffee   Social Drivers of Corporate investment banker Strain: Not on file  Food Insecurity: Food Insecurity Present (12/07/2023)   Hunger Vital Sign    Worried About Running Out of Food in the Last Year: Often true    Ran Out of Food in the Last Year: Often true  Transportation Needs: Unmet  Transportation Needs (12/07/2023)   PRAPARE - Administrator, Civil Service (Medical): Yes    Lack of Transportation (Non-Medical): Yes  Physical Activity: Not on file  Stress: Not on file  Social Connections: Unknown (10/24/2023)   Social Connection and Isolation Panel    Frequency of Communication with Friends and Family: Once a week    Frequency of Social Gatherings with Friends and Family: Never    Attends Religious Services: Never    Database administrator or Organizations: No    Attends Banker Meetings: Never    Marital Status: Patient unable to answer   Past Medical History:  Past Medical History:  Diagnosis Date   Angioedema of lips 07/28/2012   left upper (07/29/2012)   Arthritis    hands and back   Chronic back pain    Cocaine abuse (HCC)    Diabetic neuropathy (HCC)    High cholesterol    Hypertension    Neuropathy    Type II diabetes mellitus (HCC)     Past Surgical History:  Procedure Laterality Date   BACK SURGERY     CYSTOSCOPY W/ URETERAL STENT PLACEMENT Right 02/23/2021   Procedure: CYSTOSCOPY WITH RETROGRADE PYELOGRAM/URETERAL STENT PLACEMENT;  Surgeon: Selma Donnice SAUNDERS, MD;  Location: [facility];  Service: Urology;  Laterality: Right;   CYSTOSCOPY/URETEROSCOPY/HOLMIUM LASER/STENT PLACEMENT Right  12/05/2022   Procedure: CYSTOSCOPY, RIGHT URETEROSCOPY, AND RIGHT URETERAL STENT PLACEMENT;  Surgeon: Shane Steffan BROCKS, MD;  Location: [facility];  Service: Urology;  Laterality: Right;  90 MINUTES   HERNIA REPAIR Right 04/01/2012   I & D EXTREMITY Left 12/13/2012   Procedure: IRRIGATION AND DEBRIDEMENT LEFT THUMB;  Surgeon: Franky JONELLE Curia, MD;  Location: [facility] OR;  Service: Orthopedics;  Laterality: Left;   INCISION AND DRAINAGE OF WOUND     boil on back/notes 07/14/2008 (07/29/2012)   INGUINAL HERNIA REPAIR  04/01/2012   Procedure: HERNIA REPAIR INGUINAL ADULT;  Surgeon: Sherlean JINNY Laughter, MD;  Location: [facility] OR;  Service: General;  Laterality: Right;    INGUINAL HERNIA REPAIR Left 09/06/2016   Procedure: OPEN REPAIR LEFT INGUINAL HERNIA;  Surgeon: Tanda Locus, MD;  Location: [facility] OR;  Service: General;  Laterality: Left;   INSERTION OF MESH Left 09/06/2016   Procedure: INSERTION OF MESH;  Surgeon: Tanda Locus, MD;  Location: [facility] OR;  Service: General;  Laterality: Left;   TONSILLECTOMY     Family History:  Family History  Problem Relation Age of Onset   Diabetes Mother    Kidney disease Mother    Hyperlipidemia Mother    Hyperlipidemia Father    Diabetes Father     Hospital Course:  *** Detailed risk assessment is complete based on clinical exam and individual risk factors and acute suicide risk is low and acute violence risk is low.     Currently, all modifiable risk of harm to self/harm to others have been addressed and patient is no longer appropriate for the acute inpatient setting and is able to continue treatment for mental health needs in the community with the supports as indicated below.  Patient is educated and verbalized understanding of discharge plan of care including medications, follow-up appointments, mental health resources and further crisis services in the community.  He is instructed to call 911 or present to the nearest emergency room should he experience any decompensation in mood, disturbance of bowel or return of suicidal/homicidal ideations.  Patient verbalizes understanding of this education and agrees to this plan of care  Physical Findings: AIMS:  , ,  ,  ,    CIWA:    COWS:        Psychiatric Specialty Exam:  Presentation  General Appearance:  Appropriate for Environment; Casual  Eye Contact: Fair  Speech: Clear and Coherent  Speech Volume: Normal    Mood and Affect  Mood: Euthymic  Affect: Appropriate   Thought Process  Thought Processes: Coherent  Descriptions of Associations:Intact  Orientation:Full (Time, Place and Person)  Thought  Content:Logical  Hallucinations:None Ideas of Reference:None  Suicidal Thoughts: No Homicidal Thoughts: No   Sensorium  Memory: Immediate Fair; Recent Fair  Judgment: Fair  Insight: Fair   Art therapist  Concentration: Fair  Attention Span: Fair  Recall: [employer] of Knowledge: Fair  Language: Fair   Psychomotor Activity  Psychomotor Activity: No Musculoskeletal: Strength & Muscle Tone: decreased Gait & Station: Uses walker Assets  Assets: Communication Skills   Sleep  Sleep: Fair   Physical Exam: Physical Exam ROS Blood pressure 120/78, pulse (!) 103, temperature 97.6 F (36.4 C), resp. rate 18, height 5' 8 (1.727 m), weight 70.5 kg, SpO2 99%. Body mass index is 23.64 kg/m.   Social History   Tobacco Use  Smoking Status Former   Current packs/day: 0.25   Average packs/day: 0.3 packs/day for 30.0 years (7.5 ttl pk-yrs)   Types:  Cigarettes  Smokeless Tobacco Former   Quit date: 08/15/2015  Tobacco Comments   04/29/16 2  cigs daily, 10/27/17 sometimes < .25 PPD   Tobacco Cessation:  N/A, patient does not currently use tobacco products   Blood Alcohol level:  Lab Results  Component Value Date   [facility] <15 12/06/2023   ETH <15 12/05/2023    Metabolic Disorder Labs:  Lab Results  Component Value Date   HGBA1C 12.4 (H) 12/06/2023   MPG 309.18 12/06/2023   MPG 352.23 09/27/2023   Lab Results  Component Value Date   PROLACTIN 6.9 06/19/2022   Lab Results  Component Value Date   CHOL 197 10/23/2023   TRIG 113 10/23/2023   HDL 62 10/23/2023   CHOLHDL 3.2 10/23/2023   VLDL 23 10/23/2023   LDLCALC 112 (H) 10/23/2023   LDLCALC 89 05/18/2023    See Psychiatric Specialty Exam and Suicide Risk Assessment completed by Attending Physician prior to discharge.  Discharge destination:  Other:  [facility]  Is patient on multiple antipsychotic therapies at discharge:  No   Has Patient had three or more failed trials of  antipsychotic monotherapy by history:  No  Recommended Plan for Multiple Antipsychotic Therapies: NA   Allergies as of 12/12/2023       Reactions   Lisinopril  Anaphylaxis, Swelling, Other (See Comments)   Lip Swelling and angioedema, too        Medication List     STOP taking these medications    loperamide  2 MG capsule Commonly known as: IMODIUM        TAKE these medications      Indication  Accu-Chek Guide Me w/Device Kit Use to test blood sugar 3 times daily.  Indication: Diabetes   Accu-Chek Guide Test test strip Generic drug: glucose blood Use as instructed to check blood sugar three times daily  Indication: Diabetes   Accu-Chek Softclix Lancets lancets Use to check blood sugar three times daily as directed  Indication: 21-Hydroxylase Deficiency   DULoxetine  30 MG capsule Commonly known as: CYMBALTA  Take 3 capsules (90 mg total) by mouth daily. What changed:  medication strength how much to take  Indication: Major Depressive Disorder   gabapentin  300 MG capsule Commonly known as: NEURONTIN  Take 1 capsule (300 mg total) by mouth 3 (three) times daily.  Indication: Alcohol Withdrawal Syndrome   insulin  aspart 100 UNIT/ML injection Commonly known as: novoLOG  Inject 3 Units into the skin 3 (three) times daily with meals.  Indication: Type 2 Diabetes   insulin  glargine 100 UNIT/ML injection Commonly known as: LANTUS  Inject 0.12 mLs (12 Units total) into the skin at bedtime.  Indication: High Blood Sugar   metFORMIN  1000 MG tablet Commonly known as: GLUCOPHAGE  Take 1 tablet (1,000 mg total) by mouth 2 (two) times daily with a meal. What changed:  medication strength how much to take  Indication: Type 2 Diabetes   pantoprazole  40 MG tablet Commonly known as: PROTONIX  Take 1 tablet (40 mg total) by mouth daily.  Indication: Indigestion   traZODone  50 MG tablet Commonly known as: DESYREL  Take 1 tablet (50 mg total) by mouth at bedtime as  needed for sleep. What changed: additional instructions  Indication: Trouble Sleeping        Follow-up Information     [facility]. Go to.   Why: Open access walk-in services (last walk-in taken at 3 pm) during business hours: Monday to Friday, 8:00 am - 5:00 pm (office closed 12:00-1:00pm). Contact information: [address]  [address] [place] [address] [phone]                 Follow-up recommendations:  Activity:  as tolerated    Signed: Camelia LITTIE Lukes, PA-C 12/12/2023, 10:40 PM

## 2023-12-16 ENCOUNTER — Encounter (HOSPITAL_COMMUNITY): Payer: Self-pay

## 2023-12-16 ENCOUNTER — Emergency Department (HOSPITAL_COMMUNITY)
Admission: EM | Admit: 2023-12-16 | Discharge: 2023-12-16 | Disposition: A | Discharge status: Home / Self Care | Payer: MEDICAID | Attending: Emergency Medicine | Admitting: Emergency Medicine

## 2023-12-16 ENCOUNTER — Other Ambulatory Visit (HOSPITAL_COMMUNITY): Payer: Self-pay

## 2023-12-16 ENCOUNTER — Telehealth: Payer: Self-pay | Admitting: Family Medicine

## 2023-12-16 ENCOUNTER — Other Ambulatory Visit: Payer: Self-pay

## 2023-12-16 DIAGNOSIS — R109 Unspecified abdominal pain: Secondary | ICD-10-CM | POA: Diagnosis present

## 2023-12-16 DIAGNOSIS — R112 Nausea with vomiting, unspecified: Secondary | ICD-10-CM | POA: Insufficient documentation

## 2023-12-16 DIAGNOSIS — E119 Type 2 diabetes mellitus without complications: Secondary | ICD-10-CM | POA: Diagnosis not present

## 2023-12-16 DIAGNOSIS — Z794 Long term (current) use of insulin: Secondary | ICD-10-CM | POA: Diagnosis not present

## 2023-12-16 DIAGNOSIS — R1084 Generalized abdominal pain: Secondary | ICD-10-CM | POA: Diagnosis not present

## 2023-12-16 DIAGNOSIS — Z7984 Long term (current) use of oral hypoglycemic drugs: Secondary | ICD-10-CM | POA: Diagnosis not present

## 2023-12-16 DIAGNOSIS — I10 Essential (primary) hypertension: Secondary | ICD-10-CM | POA: Insufficient documentation

## 2023-12-16 DIAGNOSIS — D72829 Elevated white blood cell count, unspecified: Secondary | ICD-10-CM | POA: Insufficient documentation

## 2023-12-16 DIAGNOSIS — Z5900 Homelessness unspecified: Secondary | ICD-10-CM | POA: Diagnosis not present

## 2023-12-16 LAB — CBC WITH DIFFERENTIAL/PLATELET
Abs Immature Granulocytes: 0.1 K/uL — ABNORMAL HIGH (ref 0.00–0.07)
Basophils Absolute: 0.1 K/uL (ref 0.0–0.1)
Basophils Relative: 0 %
Eosinophils Absolute: 0.2 K/uL (ref 0.0–0.5)
Eosinophils Relative: 1 %
HCT: 46.2 % (ref 39.0–52.0)
Hemoglobin: 14.9 g/dL (ref 13.0–17.0)
Immature Granulocytes: 1 %
Lymphocytes Relative: 8 %
Lymphs Abs: 1.3 K/uL (ref 0.7–4.0)
MCH: 30.9 pg (ref 26.0–34.0)
MCHC: 32.3 g/dL (ref 30.0–36.0)
MCV: 95.9 fL (ref 80.0–100.0)
Monocytes Absolute: 0.7 K/uL (ref 0.1–1.0)
Monocytes Relative: 5 %
Neutro Abs: 13.6 K/uL — ABNORMAL HIGH (ref 1.7–7.7)
Neutrophils Relative %: 85 %
Platelets: 416 K/uL — ABNORMAL HIGH (ref 150–400)
RBC: 4.82 MIL/uL (ref 4.22–5.81)
RDW: 12.8 % (ref 11.5–15.5)
WBC: 16 K/uL — ABNORMAL HIGH (ref 4.0–10.5)
nRBC: 0 % (ref 0.0–0.2)

## 2023-12-16 LAB — COMPREHENSIVE METABOLIC PANEL WITH GFR
ALT: 26 U/L (ref 0–44)
AST: 35 U/L (ref 15–41)
Albumin: 3.7 g/dL (ref 3.5–5.0)
Alkaline Phosphatase: 102 U/L (ref 38–126)
Anion gap: 12 (ref 5–15)
BUN: 24 mg/dL — ABNORMAL HIGH (ref 8–23)
CO2: 20 mmol/L — ABNORMAL LOW (ref 22–32)
Calcium: 9 mg/dL (ref 8.9–10.3)
Chloride: 108 mmol/L (ref 98–111)
Creatinine, Ser: 1.27 mg/dL — ABNORMAL HIGH (ref 0.61–1.24)
GFR, Estimated: >60 mL/min (ref >60)
Glucose, Bld: 368 mg/dL — ABNORMAL HIGH (ref 70–99)
Potassium: 5.1 mmol/L (ref 3.5–5.1)
Sodium: 140 mmol/L (ref 135–145)
Total Bilirubin: 1.1 mg/dL (ref 0.0–1.2)
Total Protein: 7.7 g/dL (ref 6.5–8.1)

## 2023-12-16 LAB — LIPASE, BLOOD: Lipase: 54 U/L — ABNORMAL HIGH (ref 11–51)

## 2023-12-16 LAB — MAGNESIUM: Magnesium: 1.7 mg/dL (ref 1.7–2.4)

## 2023-12-16 MED ORDER — LOPERAMIDE HCL 2 MG PO CAPS
4.0000 mg | ORAL_CAPSULE | Freq: Once | ORAL | Status: AC
Start: 2023-12-16 — End: 2023-12-16
  Administered 2023-12-16: 4 mg via ORAL
  Filled 2023-12-16: qty 2

## 2023-12-16 MED ORDER — ONDANSETRON HCL 4 MG/2ML IJ SOLN
4.0000 mg | Freq: Once | INTRAMUSCULAR | Status: AC
  Administered 2023-12-16: 4 mg via INTRAVENOUS
  Filled 2023-12-16: qty 2

## 2023-12-16 MED ORDER — LACTATED RINGERS IV BOLUS
1000.0000 mL | Freq: Once | INTRAVENOUS | Status: AC
  Administered 2023-12-16: 1000 mL via INTRAVENOUS

## 2023-12-16 MED ORDER — OXYCODONE HCL 5 MG PO TABS
5.0000 mg | ORAL_TABLET | Freq: Once | ORAL | Status: AC
  Administered 2023-12-16: 5 mg via ORAL
  Filled 2023-12-16: qty 1

## 2023-12-16 MED ORDER — ONDANSETRON 4 MG PO TBDP: 4.0000 mg | ORAL_TABLET | Freq: Once | ORAL | Status: DC

## 2023-12-16 MED ORDER — ONDANSETRON HCL 4 MG PO TABS
4.0000 mg | ORAL_TABLET | Freq: Four times a day (QID) | ORAL | 0 refills | Status: DC
  Filled 2023-12-16: qty 12, 3d supply, fill #0

## 2023-12-16 NOTE — ED Notes (Signed)
 Awaiting patient from lobby.

## 2023-12-16 NOTE — ED Triage Notes (Addendum)
 Today woke up with n/v. Denies fevers. Axox4. C/O abd pain.

## 2023-12-16 NOTE — Discharge Instructions (Addendum)
 Evaluation for abdominal pain was reassuring.  Please follow-up with your primary care doctor.  I sent Zofran  to help with nausea vomiting at home.  Please continue assertive hydration with water and Gatorade.  Please return if symptoms are worse.

## 2023-12-16 NOTE — Telephone Encounter (Signed)
 Copied from CRM [id number]. Topic: Clinical - Medical Advice >> Dec 16, 2023 11:01 AM Carlatta H wrote: Reason for CRM: The patient is requesting a call back from nurse jane//Please call

## 2023-12-16 NOTE — ED Provider Triage Note (Signed)
 Emergency Medicine Provider Triage Evaluation Note  David Wyatt , a 63 y.o. male  was evaluated in triage.  Pt complains of n/vd.  Review of Systems  Positive: N/v/d Negative: Chest pain  Physical Exam  BP 128/72   Pulse 98   Temp 98.2 F (36.8 C) (Oral)   Resp 18   Ht 5' 8 (1.727 m)   Wt 71.7 kg   SpO2 100%   BMI 24.02 kg/m  Gen:   Awake, no distress   Resp:  Normal effort  MSK:   Moves extremities without difficulty  Other:  Diffuse abdominal pain, distractable  Medical Decision Making  Medically screening exam initiated at 7:57 AM.  Appropriate orders placed.  [name] was informed that the remainder of the evaluation will be completed by another provider, this initial triage assessment does not replace that evaluation, and the importance of remaining in the ED until their evaluation is complete.     [name], DO 12/16/23 [phone]

## 2023-12-16 NOTE — ED Notes (Signed)
 Pt yelling at NT and RN during triage. Pt will not answer triage questions.

## 2023-12-16 NOTE — ED Provider Notes (Signed)
 [place] EMERGENC[facility] DEPARTMENT AT [facility] Provider Note   CSN: [id number] Arrival date & time: 12/16/23  9285     Patient presents with: Nausea  HPI David Wyatt is a 63 y.o. male with hypertension, type 2 diabetes, cocaine abuse, homelessness presenting for nausea vomiting abdominal pain.  Started this morning.  Abdominal pain is about the umbilicus.  Nonradiating otherwise.  Had a normal bowel movement yesterday.  Denies urinary symptoms.  States he drinks some juice this morning and symptoms started shortly after he drank the juice.   HPI     Prior to Admission medications   Medication Sig Start Date End Date Taking? Authorizing Provider  ondansetron  (ZOFRAN ) 4 MG tablet Take 1 tablet (4 mg total) by mouth every 6 (six) hours. 12/16/23  Yes Lang Norleen POUR, PA-C  Accu-Chek Softclix Lancets lancets Use to check blood sugar three times daily as directed 11/01/23   Shrivastava, Aryendra, MD  Blood Glucose Monitoring Suppl (ACCU-CHEK GUIDE ME) w/Device KIT Use to test blood sugar 3 times daily. 11/01/23   Shrivastava, Aryendra, MD  DULoxetine  (CYMBALTA ) 30 MG capsule Take 3 capsules (90 mg total) by mouth daily. 12/13/23   Jadapalle, Sree, MD  gabapentin  (NEURONTIN ) 300 MG capsule Take 1 capsule (300 mg total) by mouth 3 (three) times daily. 12/12/23   Donnelly Mellow, MD  glucose blood (ACCU-CHEK GUIDE TEST) test strip Use as instructed to check blood sugar three times daily 11/01/23   Shrivastava, Aryendra, MD  insulin  aspart (NOVOLOG ) 100 UNIT/ML injection Inject 3 Units into the skin 3 (three) times daily with meals. 12/12/23   Jadapalle, Sree, MD  insulin  glargine (LANTUS ) 100 UNIT/ML injection Inject 0.12 mLs (12 Units total) into the skin at bedtime. 12/12/23   Donnelly Mellow, MD  metFORMIN  (GLUCOPHAGE ) 1000 MG tablet Take 1 tablet (1,000 mg total) by mouth 2 (two) times daily with a meal. 12/12/23   Donnelly Mellow, MD  pantoprazole  (PROTONIX ) 40 MG tablet Take 1 tablet  (40 mg total) by mouth daily. 11/30/23   Rosario Leatrice FERNS, MD  traZODone  (DESYREL ) 50 MG tablet Take 1 tablet (50 mg total) by mouth at bedtime as needed for sleep. 12/12/23   Donnelly Mellow, MD    Allergies: Lisinopril     Review of Systems See HPI  Updated Vital Signs BP 128/72   Pulse 98   Temp 98.2 F (36.8 C) (Oral)   Resp 18   Ht 5' 8 (1.727 m)   Wt 71.7 kg   SpO2 100%   BMI 24.02 kg/m   Physical Exam Vitals and nursing note reviewed.  HENT:     Head: Normocephalic and atraumatic.     Mouth/Throat:     Mouth: Mucous membranes are moist.  Eyes:     General:        Right eye: No discharge.        Left eye: No discharge.     Conjunctiva/sclera: Conjunctivae normal.  Cardiovascular:     Rate and Rhythm: Normal rate and regular rhythm.     Pulses: Normal pulses.     Heart sounds: Normal heart sounds.  Pulmonary:     Effort: Pulmonary effort is normal.     Breath sounds: Normal breath sounds.  Abdominal:     General: Abdomen is flat. There is no distension.     Palpations: Abdomen is soft.     Tenderness: There is generalized abdominal tenderness. There is no guarding.  Skin:    General: Skin is warm  and dry.  Neurological:     General: No focal deficit present.  Psychiatric:        Mood and Affect: Mood normal.     (all labs ordered are listed, but only abnormal results are displayed) Labs Reviewed  CBC WITH DIFFERENTIAL/PLATELET - Abnormal; Notable for the following components:      Result Value   WBC 16.0 (*)    Platelets 416 (*)    Neutro Abs 13.6 (*)    Abs Immature Granulocytes 0.10 (*)    All other components within normal limits  COMPREHENSIVE METABOLIC PANEL WITH GFR - Abnormal; Notable for the following components:   CO2 20 (*)    Glucose, Bld 368 (*)    BUN 24 (*)    Creatinine, Ser 1.27 (*)    All other components within normal limits  MAGNESIUM   LIPASE, BLOOD    EKG: None  Radiology: No results found.   Procedures    Medications Ordered in the ED  loperamide  (IMODIUM ) capsule 4 mg (4 mg Oral Given 12/16/23 0816)  oxyCODONE  (Oxy IR/ROXICODONE ) immediate release tablet 5 mg (5 mg Oral Given 12/16/23 0816)  ondansetron  (ZOFRAN ) injection 4 mg (4 mg Intravenous Given 12/16/23 0811)  lactated ringers  bolus 1,000 mL (1,000 mLs Intravenous New Bag/Given 12/16/23 0813)                                    Medical Decision Making  Initial Impression and Ddx 63 year old well-appearing male who is well-known to the department presenting for abdominal pain nausea vomiting.  Exam notable for generalized abdominal tenderness.  Vitals are normal.  DDx includes appendicitis, acute cholecystitis, acute pancreatitis, electrolyte derangement, dehydration, AKI, other. Patient PMH that increases complexity of ED encounter: with hypertension, type 2 diabetes, cocaine abuse, homelessness  Interpretation of Diagnostics - I independent reviewed and interpreted the labs as followed: Leukocytosis 16.0, CR 1.27, BUN 24, hyperglycemia 368, normal anion gap  Patient Reassessment and Ultimate Disposition/Management No witnessed vomiting during this encounter.  Fluid challenge with no issue. Considered CT scan but feel it is unnecessary at this time. Had a similar presentation a couple of weeks ago white count was 20,000 at that time and CT was unremarkable with similar presentation.  Was admitted at that time for AKI.  He looks well, nontoxic and hemodynamically stable with normal vitals and without any witnessed vomiting here.  Sent Zofran  to his pharmacy.  Advised him to follow-up with his PCP.  Discussed return precautions.  Discharged in good condition.  Patient management required discussion with the following services or consulting groups:  None  Complexity of Problems Addressed Acute complicated illness or Injury  Additional Data Reviewed and Analyzed Further history obtained from: Past medical history and medications listed  in the EMR and Prior ED visit notes  Patient Encounter Risk Assessment Consideration of hospitalization      Final diagnoses:  Abdominal pain, unspecified abdominal location    ED Discharge Orders          Ordered    ondansetron  (ZOFRAN ) 4 MG tablet  Every 6 hours        12/16/23 1018               Lang Norleen POUR, PA-C 12/16/23 1019    Rogelia Jerilynn RAMAN, MD 12/16/23 1727

## 2023-12-16 NOTE — Telephone Encounter (Signed)
 I called the patient and he said he would have to call me back in 10 minutes

## 2023-12-19 NOTE — Telephone Encounter (Signed)
 Copied from CRM [id number]. Topic: General - Call Back - No Documentation >> Dec 19, 2023 11:13 AM Lonell PEDLAR wrote:  Reason for CRM: Patient called requesting to speak with nurse, returning call from 12/16/23

## 2023-12-22 ENCOUNTER — Other Ambulatory Visit (HOSPITAL_COMMUNITY): Payer: Self-pay

## 2023-12-24 NOTE — Telephone Encounter (Signed)
 Late entry: 12/23/2023-- Call to patient number in chart times 3 unable to reach or leave message.

## 2023-12-26 ENCOUNTER — Other Ambulatory Visit (HOSPITAL_COMMUNITY): Payer: Self-pay

## 2023-12-26 NOTE — Telephone Encounter (Signed)
 Call to patient number not working

## 2023-12-27 ENCOUNTER — Emergency Department (HOSPITAL_COMMUNITY)
Admission: EM | Admit: 2023-12-27 | Discharge: 2023-12-28 | Disposition: A | Discharge status: Home / Self Care | Payer: MEDICAID | Attending: Emergency Medicine | Admitting: Emergency Medicine

## 2023-12-27 ENCOUNTER — Other Ambulatory Visit: Payer: Self-pay

## 2023-12-27 DIAGNOSIS — Z794 Long term (current) use of insulin: Secondary | ICD-10-CM | POA: Diagnosis not present

## 2023-12-27 DIAGNOSIS — E1165 Type 2 diabetes mellitus with hyperglycemia: Secondary | ICD-10-CM | POA: Insufficient documentation

## 2023-12-27 DIAGNOSIS — Z7984 Long term (current) use of oral hypoglycemic drugs: Secondary | ICD-10-CM | POA: Diagnosis not present

## 2023-12-27 DIAGNOSIS — R739 Hyperglycemia, unspecified: Secondary | ICD-10-CM | POA: Diagnosis present

## 2023-12-27 LAB — COMPREHENSIVE METABOLIC PANEL WITH GFR
ALT: 17 U/L (ref 0–44)
AST: 19 U/L (ref 15–41)
Albumin: 3.7 g/dL (ref 3.5–5.0)
Alkaline Phosphatase: 130 U/L — ABNORMAL HIGH (ref 38–126)
Anion gap: 13 (ref 5–15)
BUN: 11 mg/dL (ref 8–23)
CO2: 20 mmol/L — ABNORMAL LOW (ref 22–32)
Calcium: 8.9 mg/dL (ref 8.9–10.3)
Chloride: 101 mmol/L (ref 98–111)
Creatinine, Ser: 1.03 mg/dL (ref 0.61–1.24)
GFR, Estimated: >60 mL/min (ref >60)
Glucose, Bld: 604 mg/dL (ref 70–99)
Potassium: 4 mmol/L (ref 3.5–5.1)
Sodium: 134 mmol/L — ABNORMAL LOW (ref 135–145)
Total Bilirubin: 0.4 mg/dL (ref 0.0–1.2)
Total Protein: 6.9 g/dL (ref 6.5–8.1)

## 2023-12-27 LAB — I-STAT CHEM 8, ED
BUN: 12 mg/dL (ref 8–23)
Calcium, Ion: 1.11 mmol/L — ABNORMAL LOW (ref 1.15–1.40)
Chloride: 102 mmol/L (ref 98–111)
Creatinine, Ser: 1 mg/dL (ref 0.61–1.24)
Glucose, Bld: 622 mg/dL (ref 70–99)
HCT: 41 % (ref 39.0–52.0)
Hemoglobin: 13.9 g/dL (ref 13.0–17.0)
Potassium: 3.9 mmol/L (ref 3.5–5.1)
Sodium: 138 mmol/L (ref 135–145)
TCO2: 21 mmol/L — ABNORMAL LOW (ref 22–32)

## 2023-12-27 LAB — CBC
HCT: 40 % (ref 39.0–52.0)
Hemoglobin: 12.9 g/dL — ABNORMAL LOW (ref 13.0–17.0)
MCH: 30.9 pg (ref 26.0–34.0)
MCHC: 32.3 g/dL (ref 30.0–36.0)
MCV: 95.9 fL (ref 80.0–100.0)
Platelets: 361 K/uL (ref 150–400)
RBC: 4.17 MIL/uL — ABNORMAL LOW (ref 4.22–5.81)
RDW: 13 % (ref 11.5–15.5)
WBC: 8.1 K/uL (ref 4.0–10.5)
nRBC: 0 % (ref 0.0–0.2)

## 2023-12-27 LAB — BLOOD GAS, VENOUS
Acid-base deficit: 2.9 mmol/L — ABNORMAL HIGH (ref 0.0–2.0)
Bicarbonate: 22.6 mmol/L (ref 20.0–28.0)
O2 Saturation: 78.6 %
Patient temperature: 36.1
pCO2, Ven: 39 mmHg — ABNORMAL LOW (ref 44–60)
pH, Ven: 7.36 (ref 7.25–7.43)
pO2, Ven: 43 mmHg (ref 32–45)

## 2023-12-27 LAB — URINALYSIS, ROUTINE W REFLEX MICROSCOPIC
Bacteria, UA: NONE SEEN
Bilirubin Urine: NEGATIVE
Glucose, UA: >=500 mg/dL — AB
Hgb urine dipstick: NEGATIVE
Ketones, ur: NEGATIVE mg/dL
Leukocytes,Ua: NEGATIVE
Nitrite: NEGATIVE
Protein, ur: NEGATIVE mg/dL
Specific Gravity, Urine: 1.027 (ref 1.005–1.030)
pH: 6 (ref 5.0–8.0)

## 2023-12-27 LAB — CBG MONITORING, ED
Glucose-Capillary: 584 mg/dL (ref 70–99)
Glucose-Capillary: 198 mg/dL — ABNORMAL HIGH (ref 70–99)
Glucose-Capillary: 140 mg/dL — ABNORMAL HIGH (ref 70–99)

## 2023-12-27 LAB — BETA-HYDROXYBUTYRIC ACID: Beta-Hydroxybutyric Acid: 0.11 mmol/L (ref 0.05–0.27)

## 2023-12-27 MED ORDER — LACTATED RINGERS IV BOLUS
2000.0000 mL | Freq: Once | INTRAVENOUS | Status: AC
  Administered 2023-12-27: 2000 mL via INTRAVENOUS

## 2023-12-27 MED ORDER — INSULIN GLARGINE 100 UNIT/ML SOLOSTAR PEN
12.0000 [IU] | PEN_INJECTOR | Freq: Every day | SUBCUTANEOUS | 11 refills | Status: DC
  Filled 2023-12-27: qty 9, 75d supply, fill #0

## 2023-12-27 MED ORDER — GABAPENTIN 300 MG PO CAPS
300.0000 mg | ORAL_CAPSULE | Freq: Once | ORAL | Status: AC
  Administered 2023-12-27: 300 mg via ORAL
  Filled 2023-12-27: qty 1

## 2023-12-27 MED ORDER — INSULIN ASPART 100 UNIT/ML FLEXPEN
3.0000 [IU] | PEN_INJECTOR | Freq: Three times a day (TID) | SUBCUTANEOUS | 11 refills | Status: DC
  Filled 2023-12-27: qty 9, 90d supply, fill #0

## 2023-12-27 MED ADMIN — Insulin Aspart Inj Soln 100 Unit/ML: 10 [IU] | SUBCUTANEOUS | NDC 73070010011

## 2023-12-27 MED FILL — Insulin Aspart Inj Soln 100 Unit/ML: 10.0000 [IU] | INTRAMUSCULAR | Qty: 0.1 | Status: AC

## 2023-12-27 NOTE — ED Triage Notes (Signed)
 Patient going to ED by EMS from home for hyperglycemia. Per EMS his CBG was greater than 500 and he c/o numbness in both feet, he reports being out of his Gabapentin .

## 2023-12-27 NOTE — ED Notes (Signed)
 [facility] notified for transport

## 2023-12-27 NOTE — Discharge Instructions (Signed)
 You were seen in the emergency department today for concerns of elevated glucose levels.  Your glucose levels were notably elevated but you were not experiencing any acute complications from this.  Were able to bring your glucose levels back down to a reasonable range.  Please make sure that you are taking your home doses of Lantus  and NovoLog  as prescribed.  Follow-up with your primary care provider for further evaluation.  Return emergency department for concerns of worsening symptoms.
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
## 2023-12-28 ENCOUNTER — Other Ambulatory Visit (HOSPITAL_COMMUNITY): Payer: Self-pay

## 2023-12-28 MED ORDER — INSULIN PEN NEEDLE 32G X 4 MM MISC
1.0000 | Freq: Three times a day (TID) | 11 refills | Status: DC
  Filled 2023-12-28: qty 100, 25d supply, fill #0

## 2023-12-29 ENCOUNTER — Ambulatory Visit: Payer: Self-pay

## 2023-12-29 ENCOUNTER — Emergency Department (HOSPITAL_COMMUNITY)
Admission: EM | Admit: 2023-12-29 | Discharge: 2023-12-29 | Disposition: A | Discharge status: Home / Self Care | Payer: MEDICAID | Attending: Emergency Medicine | Admitting: Emergency Medicine

## 2023-12-29 ENCOUNTER — Other Ambulatory Visit: Payer: Self-pay

## 2023-12-29 ENCOUNTER — Other Ambulatory Visit (HOSPITAL_COMMUNITY): Payer: Self-pay

## 2023-12-29 ENCOUNTER — Encounter (HOSPITAL_COMMUNITY): Payer: Self-pay | Admitting: Emergency Medicine

## 2023-12-29 DIAGNOSIS — R739 Hyperglycemia, unspecified: Secondary | ICD-10-CM

## 2023-12-29 DIAGNOSIS — E1165 Type 2 diabetes mellitus with hyperglycemia: Secondary | ICD-10-CM | POA: Insufficient documentation

## 2023-12-29 DIAGNOSIS — Z794 Long term (current) use of insulin: Secondary | ICD-10-CM | POA: Insufficient documentation

## 2023-12-29 DIAGNOSIS — Z79899 Other long term (current) drug therapy: Secondary | ICD-10-CM | POA: Insufficient documentation

## 2023-12-29 DIAGNOSIS — Z7984 Long term (current) use of oral hypoglycemic drugs: Secondary | ICD-10-CM | POA: Diagnosis not present

## 2023-12-29 DIAGNOSIS — I10 Essential (primary) hypertension: Secondary | ICD-10-CM | POA: Diagnosis not present

## 2023-12-29 LAB — BLOOD GAS, VENOUS
Acid-Base Excess: 0.9 mmol/L (ref 0.0–2.0)
Bicarbonate: 25.3 mmol/L (ref 20.0–28.0)
Drawn by: 7541
O2 Saturation: 92.8 %
Patient temperature: 37
pCO2, Ven: 39 mmHg — ABNORMAL LOW (ref 44–60)
pH, Ven: 7.42 (ref 7.25–7.43)
pO2, Ven: 60 mmHg — ABNORMAL HIGH (ref 32–45)

## 2023-12-29 LAB — CBC
HCT: 45.2 % (ref 39.0–52.0)
Hemoglobin: 14.7 g/dL (ref 13.0–17.0)
MCH: 30.3 pg (ref 26.0–34.0)
MCHC: 32.5 g/dL (ref 30.0–36.0)
MCV: 93.2 fL (ref 80.0–100.0)
Platelets: 413 K/uL — ABNORMAL HIGH (ref 150–400)
RBC: 4.85 MIL/uL (ref 4.22–5.81)
RDW: 12.9 % (ref 11.5–15.5)
WBC: 9.4 K/uL (ref 4.0–10.5)
nRBC: 0 % (ref 0.0–0.2)

## 2023-12-29 LAB — CBG MONITORING, ED
Glucose-Capillary: 364 mg/dL — ABNORMAL HIGH (ref 70–99)
Glucose-Capillary: 331 mg/dL — ABNORMAL HIGH (ref 70–99)
Glucose-Capillary: 333 mg/dL — ABNORMAL HIGH (ref 70–99)
Glucose-Capillary: 299 mg/dL — ABNORMAL HIGH (ref 70–99)

## 2023-12-29 LAB — I-STAT CG4 LACTIC ACID, ED: Lactic Acid, Venous: 1.5 mmol/L (ref 0.5–1.9)

## 2023-12-29 LAB — COMPREHENSIVE METABOLIC PANEL WITH GFR
ALT: 12 U/L (ref 0–44)
AST: 21 U/L (ref 15–41)
Albumin: 4.3 g/dL (ref 3.5–5.0)
Alkaline Phosphatase: 132 U/L — ABNORMAL HIGH (ref 38–126)
Anion gap: 13 (ref 5–15)
BUN: 15 mg/dL (ref 8–23)
CO2: 23 mmol/L (ref 22–32)
Calcium: 10 mg/dL (ref 8.9–10.3)
Chloride: 100 mmol/L (ref 98–111)
Creatinine, Ser: 1.15 mg/dL (ref 0.61–1.24)
GFR, Estimated: >60 mL/min (ref >60)
Glucose, Bld: 359 mg/dL — ABNORMAL HIGH (ref 70–99)
Potassium: 4.2 mmol/L (ref 3.5–5.1)
Sodium: 135 mmol/L (ref 135–145)
Total Bilirubin: 0.5 mg/dL (ref 0.0–1.2)
Total Protein: 9.1 g/dL — ABNORMAL HIGH (ref 6.5–8.1)

## 2023-12-29 MED ORDER — INSULIN ASPART 100 UNIT/ML IJ SOLN
2.0000 [IU] | Freq: Once | INTRAMUSCULAR | Status: AC
  Administered 2023-12-29: 2 [IU] via SUBCUTANEOUS
  Filled 2023-12-29: qty 0.02

## 2023-12-29 MED ORDER — SODIUM CHLORIDE 0.9 % IV BOLUS
1000.0000 mL | Freq: Once | INTRAVENOUS | Status: AC
  Administered 2023-12-29: 1000 mL via INTRAVENOUS

## 2023-12-29 NOTE — ED Provider Notes (Signed)
 [place] EMERGENCY DEPARTMENT AT [facility] Provider Note   CSN: [id number] Arrival date & time: 12/29/23  1755     Patient presents with: Hyperglycemia   David Wyatt is a 63 y.o. male.   Patient here with high blood sugar.  He ate several pieces of cake this afternoon blood sugar was reading high on his meter so he gave himself insulin  called EMS.  Denies any chest pain or shortness of breath.  Denies any pain with urination fever or chills.  Is concerned about his blood sugar being high.  Took the insulin  prior to coming here.  The history is provided by the patient.       Prior to Admission medications   Medication Sig Start Date End Date Taking? Authorizing Provider  Accu-Chek Softclix Lancets lancets Use to check blood sugar three times daily as directed 11/01/23   Shrivastava, Aryendra, MD  Blood Glucose Monitoring Suppl (ACCU-CHEK GUIDE ME) w/Device KIT Use to test blood sugar 3 times daily. 11/01/23   Shrivastava, Aryendra, MD  DULoxetine  (CYMBALTA ) 30 MG capsule Take 3 capsules (90 mg total) by mouth daily. 12/13/23   Donnelly Mellow, MD  gabapentin  (NEURONTIN ) 300 MG capsule Take 1 capsule (300 mg total) by mouth 3 (three) times daily. 12/12/23   Donnelly Mellow, MD  glucose blood (ACCU-CHEK GUIDE TEST) test strip Use as instructed to check blood sugar three times daily 11/01/23   Shrivastava, Aryendra, MD  insulin  aspart (NOVOLOG ) 100 UNIT/ML FlexPen Inject 3 Units into the skin 3 (three) times daily with meals. 12/27/23   Zelaya, Oscar A, PA-C  insulin  glargine (LANTUS ) 100 UNIT/ML Solostar Pen Inject 12 Units into the skin at bedtime. 12/27/23   Zelaya, Oscar A, PA-C  Insulin  Pen Needle 32G X 4 MM MISC Use 1 each to inject insulin  into the skin 4 (four) times daily -  before meals and at bedtime. 12/28/23   Zelaya, Oscar A, PA-C  metFORMIN  (GLUCOPHAGE ) 1000 MG tablet Take 1 tablet (1,000 mg total) by mouth 2 (two) times daily with a meal. 12/12/23   Donnelly Mellow, MD  ondansetron  (ZOFRAN ) 4 MG tablet Take 1 tablet (4 mg total) by mouth every 6 (six) hours. 12/16/23   Robinson, John K, PA-C  pantoprazole  (PROTONIX ) 40 MG tablet Take 1 tablet (40 mg total) by mouth daily. 11/30/23   Rosario Leatrice FERNS, MD  traZODone  (DESYREL ) 50 MG tablet Take 1 tablet (50 mg total) by mouth at bedtime as needed for sleep. 12/12/23   Donnelly Mellow, MD    Allergies: Lisinopril     Review of Systems  Updated Vital Signs BP (!) 143/96   Pulse 79   Temp 98.6 F (37 C)   Resp 17   SpO2 96%   Physical Exam Vitals and nursing note reviewed.  Constitutional:      General: He is not in acute distress.    Appearance: He is well-developed. He is not ill-appearing.  HENT:     Head: Normocephalic and atraumatic.     Nose: Nose normal.     Mouth/Throat:     Mouth: Mucous membranes are moist.  Eyes:     Extraocular Movements: Extraocular movements intact.     Conjunctiva/sclera: Conjunctivae normal.     Pupils: Pupils are equal, round, and reactive to light.  Cardiovascular:     Rate and Rhythm: Normal rate and regular rhythm.     Pulses: Normal pulses.     Heart sounds: Normal heart sounds. No murmur heard.  Pulmonary:     Effort: Pulmonary effort is normal. No respiratory distress.     Breath sounds: Normal breath sounds.  Abdominal:     General: Abdomen is flat.     Palpations: Abdomen is soft.     Tenderness: There is no abdominal tenderness.  Musculoskeletal:        General: No swelling.     Cervical back: Normal range of motion and neck supple.  Skin:    General: Skin is warm and dry.     Capillary Refill: Capillary refill takes less than 2 seconds.  Neurological:     General: No focal deficit present.     Mental Status: He is alert.  Psychiatric:        Mood and Affect: Mood normal.     (all labs ordered are listed, but only abnormal results are displayed) Labs Reviewed  CBC - Abnormal; Notable for the following components:      Result  Value   Platelets 413 (*)    All other components within normal limits  BLOOD GAS, VENOUS - Abnormal; Notable for the following components:   pCO2, Ven 39 (*)    pO2, Ven 60 (*)    All other components within normal limits  COMPREHENSIVE METABOLIC PANEL WITH GFR - Abnormal; Notable for the following components:   Glucose, Bld 359 (*)    Total Protein 9.1 (*)    Alkaline Phosphatase 132 (*)    All other components within normal limits  CBG MONITORING, ED - Abnormal; Notable for the following components:   Glucose-Capillary 364 (*)    All other components within normal limits  CBG MONITORING, ED - Abnormal; Notable for the following components:   Glucose-Capillary 331 (*)    All other components within normal limits  CBG MONITORING, ED - Abnormal; Notable for the following components:   Glucose-Capillary 333 (*)    All other components within normal limits  I-STAT CG4 LACTIC ACID, ED  CBG MONITORING, ED    EKG: None  Radiology: No results found.   Procedures   Medications Ordered in the ED  sodium chloride  0.9 % bolus 1,000 mL (1,000 mLs Intravenous New Bag/Given 12/29/23 2042)                                    Medical Decision Making Amount and/or Complexity of Data Reviewed Labs: ordered.   [name] is here for elevated blood sugar after he ate several pieces of cake.  He gives up insulin  before coming here.  He says his meter was reading high but on arrival here his blood sugars in the 300s.  Overall he does not have any major issues otherwise.  Will check basic labs give him IV fluids and rule out DKA.  History of poorly controlled diabetes.  History of polysubstance abuse hypertension neuropathy.  He is well-appearing otherwise.  Not having any nausea vomit abdominal pain.  Overall patient is not in DKA per my review interpretation of labs.  There is no significant leukocytosis anemia or electrolyte abnormality otherwise.  Blood sugars improving.  Discharge in  good condition.  This chart was dictated using voice recognition software.  Despite best efforts to proofread,  errors can occur which can change the documentation meaning.      Final diagnoses:  Hyperglycemia    ED Discharge Orders     None  Ruthe Cornet, DO 12/29/23 2155

## 2023-12-29 NOTE — ED Triage Notes (Signed)
 Patient arrived via EMS following consumption of 3 slices of cake and pt. report of a blood sugar reading of HI 1 hour prior (1700). Following the reading EMS reported that patient took Novalog, metformin , and amlodipine . EMS reports high blood pressure and nausea.  EMS Vx  Bp 170/96 Hr 83 O2 100% ra CBG 578

## 2023-12-29 NOTE — ED Provider Triage Note (Signed)
 Emergency Medicine Provider Triage Evaluation Note  David Wyatt , a 63 y.o. male  was evaluated in triage.  Pt complains of high blood sugar. States no other sx. States his meter read high and he gave himself some SQ insulin  and came to the ER.  NO CP or SOB or symptoms today. State's he's always a little off balance  Review of Systems  Positive: High blood sugar Negative: Fever   Physical Exam  BP (!) 153/99   Pulse 83   Temp 98.9 F (37.2 C) (Oral)   Resp 16   SpO2 95%  Gen:   Awake, no distress   Resp:  Normal effort  MSK:   Moves extremities without difficulty  Other:  Slightly unsteady gait. Strength normal throughout.   Medical Decision Making  Medically screening exam initiated at 6:42 PM.  Appropriate orders placed.  David Wyatt was informed that the remainder of the evaluation will be completed by another provider, this initial triage assessment does not replace that evaluation, and the importance of remaining in the ED until their evaluation is complete.  [address]   David Wyatt [place], [place] 12/29/23 [phone]
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
## 2023-12-30 NOTE — Telephone Encounter (Signed)
 noted
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
## 2024-01-08 ENCOUNTER — Other Ambulatory Visit (HOSPITAL_COMMUNITY): Payer: Self-pay

## 2024-01-12 ENCOUNTER — Other Ambulatory Visit (HOSPITAL_COMMUNITY): Payer: Self-pay

## 2024-01-15 ENCOUNTER — Emergency Department (HOSPITAL_COMMUNITY)
Admission: EM | Admit: 2024-01-15 | Discharge: 2024-01-15 | Disposition: A | Discharge status: Home / Self Care | Payer: MEDICAID | Attending: Emergency Medicine | Admitting: Emergency Medicine

## 2024-01-15 ENCOUNTER — Telehealth: Payer: Self-pay

## 2024-01-15 ENCOUNTER — Other Ambulatory Visit: Payer: Self-pay

## 2024-01-15 ENCOUNTER — Encounter (HOSPITAL_COMMUNITY): Payer: Self-pay

## 2024-01-15 ENCOUNTER — Encounter (HOSPITAL_COMMUNITY): Payer: Self-pay | Admitting: *Deleted

## 2024-01-15 ENCOUNTER — Other Ambulatory Visit (HOSPITAL_COMMUNITY): Payer: Self-pay

## 2024-01-15 DIAGNOSIS — M545 Low back pain, unspecified: Secondary | ICD-10-CM | POA: Diagnosis present

## 2024-01-15 DIAGNOSIS — Z794 Long term (current) use of insulin: Secondary | ICD-10-CM | POA: Insufficient documentation

## 2024-01-15 DIAGNOSIS — M791 Myalgia, unspecified site: Secondary | ICD-10-CM | POA: Insufficient documentation

## 2024-01-15 DIAGNOSIS — I10 Essential (primary) hypertension: Secondary | ICD-10-CM | POA: Insufficient documentation

## 2024-01-15 DIAGNOSIS — M5442 Lumbago with sciatica, left side: Secondary | ICD-10-CM | POA: Diagnosis not present

## 2024-01-15 DIAGNOSIS — M5441 Lumbago with sciatica, right side: Secondary | ICD-10-CM | POA: Diagnosis not present

## 2024-01-15 DIAGNOSIS — Z765 Malingerer [conscious simulation]: Secondary | ICD-10-CM | POA: Diagnosis not present

## 2024-01-15 DIAGNOSIS — E119 Type 2 diabetes mellitus without complications: Secondary | ICD-10-CM | POA: Diagnosis not present

## 2024-01-15 DIAGNOSIS — Z7984 Long term (current) use of oral hypoglycemic drugs: Secondary | ICD-10-CM | POA: Insufficient documentation

## 2024-01-15 DIAGNOSIS — G8929 Other chronic pain: Secondary | ICD-10-CM | POA: Diagnosis not present

## 2024-01-15 DIAGNOSIS — Z5900 Homelessness unspecified: Secondary | ICD-10-CM | POA: Diagnosis not present

## 2024-01-15 DIAGNOSIS — E1140 Type 2 diabetes mellitus with diabetic neuropathy, unspecified: Secondary | ICD-10-CM | POA: Insufficient documentation

## 2024-01-15 MED ORDER — LIDOCAINE 5 % EX PTCH
1.0000 | MEDICATED_PATCH | CUTANEOUS | 0 refills | Status: DC
  Filled 2024-01-15: qty 30, 30d supply, fill #0

## 2024-01-15 MED ORDER — IBUPROFEN 400 MG PO TABS
600.0000 mg | ORAL_TABLET | Freq: Once | ORAL | Status: AC
  Administered 2024-01-15: 600 mg via ORAL
  Filled 2024-01-15: qty 1

## 2024-01-15 MED ORDER — LIDOCAINE 5 % EX PTCH
1.0000 | MEDICATED_PATCH | Freq: Once | CUTANEOUS | Status: DC
  Administered 2024-01-15: 1 via TRANSDERMAL
  Filled 2024-01-15: qty 1

## 2024-01-15 MED ORDER — ACETAMINOPHEN 500 MG PO TABS
1000.0000 mg | ORAL_TABLET | Freq: Once | ORAL | Status: AC
  Administered 2024-01-15: 1000 mg via ORAL
  Filled 2024-01-15: qty 2

## 2024-01-15 NOTE — Discharge Instructions (Addendum)
 You were seen in the emergency department for your back pain.  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  You can take Tylenol  and Motrin  every 6 hours and use lidocaine  patches.  You should follow-up with your primary doctor or your spine surgeon for reassessment if you may benefit from physical therapy or other treatments.  You should return to the emergency department if you have numbness or weakness in your legs, you are unable to urinate or if you have any other new or concerning symptoms.

## 2024-01-15 NOTE — ED Notes (Signed)
 ED Provider at bedside.

## 2024-01-15 NOTE — ED Triage Notes (Signed)
 Pt states he is here for body aches all over since it has gotten cold outside and states he is suicidal so he can stay in the hospital for a couple of days. Denies plans/ HI

## 2024-01-15 NOTE — ED Provider Notes (Signed)
 [place] EMERGENCY DEPARTMENT AT [facility] Provider Note   CSN: [id number] Arrival date & time: 01/15/24  1149     Patient presents with: Back Pain and Foot Pain   David Wyatt is a 63 y.o. male.   David Wyatt is a 63 y.o. male with a history of hypertension, diabetes, chronic back pain and neuropathy, who presents to the emergency department for reevaluation after being discharged from the ED a few hours ago.  Patient reports he is checking in this time because of his foot pain and back pain.  He was evaluated earlier today for his chronic back pain.  He reports both of these issues have been going on for many years.  He reports chronic back pain and remote history of a lumbar spine surgery many years ago.  He also reports ongoing pain in bilateral feet due to his diabetic neuropathy.  He denies any recent change or worsening of these pains.  He denies any trauma or injury.  He has not noted any wounds on his feet.  Patient reported difficulty walking but was noted to ambulate in to the emergency department independently.  He does use a walker as needed at baseline.  Patient denies any fevers or chills.  When asked why he came back in after being evaluated previously today he says well my feet still hurt.   The history is provided by the patient.  Back Pain Foot Pain       Prior to Admission medications   Medication Sig Start Date End Date Taking? Authorizing Provider  Accu-Chek Softclix Lancets lancets Use to check blood sugar three times daily as directed 11/01/23   Shrivastava, Aryendra, MD  Blood Glucose Monitoring Suppl (ACCU-CHEK GUIDE ME) w/Device KIT Use to test blood sugar 3 times daily. 11/01/23   Shrivastava, Aryendra, MD  DULoxetine  (CYMBALTA ) 30 MG capsule Take 3 capsules (90 mg total) by mouth daily. 12/13/23   Donnelly Mellow, MD  gabapentin  (NEURONTIN ) 300 MG capsule Take 1 capsule (300 mg total) by mouth 3 (three) times daily. 12/12/23   Donnelly Mellow,  MD  glucose blood (ACCU-CHEK GUIDE TEST) test strip Use as instructed to check blood sugar three times daily 11/01/23   Shrivastava, Aryendra, MD  insulin  aspart (NOVOLOG ) 100 UNIT/ML FlexPen Inject 3 Units into the skin 3 (three) times daily with meals. 12/27/23   Zelaya, Oscar A, PA-C  insulin  glargine (LANTUS ) 100 UNIT/ML Solostar Pen Inject 12 Units into the skin at bedtime. 12/27/23   Zelaya, Oscar A, PA-C  Insulin  Pen Needle 32G X 4 MM MISC Use 1 each to inject insulin  into the skin 4 (four) times daily -  before meals and at bedtime. 12/28/23   Zelaya, Oscar A, PA-C  lidocaine  (LIDODERM ) 5 % Place 1 patch onto the skin daily. Remove & Discard patch within 12 hours or as directed by MD 01/15/24   Kingsley, Victoria K, DO  metFORMIN  (GLUCOPHAGE ) 1000 MG tablet Take 1 tablet (1,000 mg total) by mouth 2 (two) times daily with a meal. 12/12/23   Donnelly Mellow, MD  ondansetron  (ZOFRAN ) 4 MG tablet Take 1 tablet (4 mg total) by mouth every 6 (six) hours. 12/16/23   Robinson, John K, PA-C  pantoprazole  (PROTONIX ) 40 MG tablet Take 1 tablet (40 mg total) by mouth daily. 11/30/23   Rosario Leatrice FERNS, MD  traZODone  (DESYREL ) 50 MG tablet Take 1 tablet (50 mg total) by mouth at bedtime as needed for sleep. 12/12/23   Donnelly Mellow, MD  Allergies: Lisinopril     Review of Systems  Musculoskeletal:  Positive for back pain.       Bilateral foot pain    Updated Vital Signs BP (!) 157/82 (BP Location: Left Arm)   Pulse 76   Temp 97.9 F (36.6 C)   Resp 18   SpO2 98%   Physical Exam Vitals and nursing note reviewed.  Constitutional:      General: He is not in acute distress.    Appearance: Normal appearance. He is well-developed. He is not ill-appearing or diaphoretic.  HENT:     Head: Normocephalic and atraumatic.  Eyes:     General:        Right eye: No discharge.        Left eye: No discharge.  Cardiovascular:     Rate and Rhythm: Normal rate and regular rhythm.     Heart sounds:  Normal heart sounds.  Pulmonary:     Effort: Pulmonary effort is normal. No respiratory distress.     Breath sounds: Normal breath sounds.  Musculoskeletal:     Comments: Bilateral feet and lower extremities examined after patient was able to independently remove shoes and socks.  No wounds noted, 2+ DP and PT pulses bilaterally, bilateral lower extremities are warm and well-perfused without erythema or focal tenderness.  Patient was then able to stand up on his own and put his shoes and socks back on.  He does not have any focal lumbar spine tenderness or deformity noted.  Neurological:     Mental Status: He is alert and oriented to person, place, and time.     Coordination: Coordination normal.     Comments: Bilateral lower extremities with 5/5 strength with dorsi and plantarflexion as well as hip flexion and extension.  Psychiatric:        Mood and Affect: Mood normal.        Behavior: Behavior normal.     (all labs ordered are listed, but only abnormal results are displayed) Labs Reviewed - No data to display  EKG: None  Radiology: No results found.   Procedures   Medications Ordered in the ED - No data to display                                  Medical Decision Making  Patient returns to the emergency department for second time this morning for continued evaluation of chronic pain to the hip and back that has been present for several years and are not acutely changed or worsened.  No new injury or trauma.  Patient initially claimed difficulty walking but has ambulated in the department.  He does not have any visible wounds, signs of infection or trauma to the feet.  Patient was provided with reassurance.  I reviewed his prescribed medications and he has been prescribed medications for his neuropathy.  I stressed the importance of following up with a primary care provider for these ongoing chronic complaints.  Patient was provided a sandwich and something to drink and  discharged in good condition.     Final diagnoses:  Chronic painful diabetic neuropathy [facility])    ED Discharge Orders     None          [name] 01/19/24 2114    Freddi Hamilton, MD 01/20/24 [phone]

## 2024-01-15 NOTE — ED Provider Notes (Signed)
 [place] EMERGENCY DEPARTMENT AT [facility] Provider Note   CSN: [id number] Arrival date & time: 01/15/24  9275     Patient presents with: Generalized Body Aches   David Wyatt is a 63 y.o. male.   Patient is a 63 year old male with a past medical history of diabetes, prior lumbar surgery homelessness presenting to the emergency department with back pain.  The patient states that he has chronic back pain and it has been worsening since the weather has been getting cold.  He states he was told to do hardware in his back that he will feel pain was called.  States he has not taken anything for pain.  States he has bilateral sciatica but denies any numbness or weakness or difficulty with urination or bowel movements.  States he uses a walker to get around.  Did report body aches and SI in triage however did not report any of these complaints to myself.  The history is provided by the patient.       Prior to Admission medications   Medication Sig Start Date End Date Taking? Authorizing Provider  lidocaine  (LIDODERM ) 5 % Place 1 patch onto the skin daily. Remove & Discard patch within 12 hours or as directed by MD 01/15/24  Yes Kingsley, Rylyn Zawistowski K, DO  Accu-Chek Softclix Lancets lancets Use to check blood sugar three times daily as directed 11/01/23   Shrivastava, Aryendra, MD  Blood Glucose Monitoring Suppl (ACCU-CHEK GUIDE ME) w/Device KIT Use to test blood sugar 3 times daily. 11/01/23   Shrivastava, Aryendra, MD  DULoxetine  (CYMBALTA ) 30 MG capsule Take 3 capsules (90 mg total) by mouth daily. 12/13/23   Donnelly Mellow, MD  gabapentin  (NEURONTIN ) 300 MG capsule Take 1 capsule (300 mg total) by mouth 3 (three) times daily. 12/12/23   Donnelly Mellow, MD  glucose blood (ACCU-CHEK GUIDE TEST) test strip Use as instructed to check blood sugar three times daily 11/01/23   Shrivastava, Aryendra, MD  insulin  aspart (NOVOLOG ) 100 UNIT/ML FlexPen Inject 3 Units into the skin 3 (three)  times daily with meals. 12/27/23   Zelaya, Oscar A, PA-C  insulin  glargine (LANTUS ) 100 UNIT/ML Solostar Pen Inject 12 Units into the skin at bedtime. 12/27/23   Zelaya, Oscar A, PA-C  Insulin  Pen Needle 32G X 4 MM MISC Use 1 each to inject insulin  into the skin 4 (four) times daily -  before meals and at bedtime. 12/28/23   Zelaya, Oscar A, PA-C  metFORMIN  (GLUCOPHAGE ) 1000 MG tablet Take 1 tablet (1,000 mg total) by mouth 2 (two) times daily with a meal. 12/12/23   Donnelly Mellow, MD  ondansetron  (ZOFRAN ) 4 MG tablet Take 1 tablet (4 mg total) by mouth every 6 (six) hours. 12/16/23   Robinson, John K, PA-C  pantoprazole  (PROTONIX ) 40 MG tablet Take 1 tablet (40 mg total) by mouth daily. 11/30/23   Rosario Leatrice FERNS, MD  traZODone  (DESYREL ) 50 MG tablet Take 1 tablet (50 mg total) by mouth at bedtime as needed for sleep. 12/12/23   Jadapalle, Sree, MD    Allergies: Lisinopril     Review of Systems  Updated Vital Signs BP 127/77   Pulse 71   Temp 98.2 F (36.8 C) (Oral)   Resp 16   Ht 5' 8 (1.727 m)   Wt 72.6 kg   SpO2 99%   BMI 24.33 kg/m   Physical Exam Vitals and nursing note reviewed.  Constitutional:      Appearance: Normal appearance.  Comments: Disheveled appearing  HENT:     Head: Normocephalic and atraumatic.     Nose: Nose normal.     Mouth/Throat:     Mouth: Mucous membranes are moist.  Eyes:     Extraocular Movements: Extraocular movements intact.  Cardiovascular:     Rate and Rhythm: Normal rate.  Pulmonary:     Effort: Pulmonary effort is normal.  Abdominal:     General: Abdomen is flat.  Musculoskeletal:        General: Normal range of motion.     Cervical back: Normal range of motion.     Comments: Tenderness along surgical scar in mid to lower lumbar spine, no surrounding erythema, warmth or swelling, paraspinal muscle tenderness to palpation  Skin:    General: Skin is warm and dry.  Neurological:     General: No focal deficit present.     Mental  Status: He is alert and oriented to person, place, and time.     Sensory: No sensory deficit.     Motor: No weakness (5 out of 5 strength in bilateral lower extremities).  Psychiatric:        Mood and Affect: Mood normal.        Behavior: Behavior normal.     (all labs ordered are listed, but only abnormal results are displayed) Labs Reviewed - No data to display  EKG: None  Radiology: No results found.   Procedures   Medications Ordered in the ED  lidocaine  (LIDODERM ) 5 % 1-3 patch (1 patch Transdermal Patch Applied 01/15/24 0837)  acetaminophen  (TYLENOL ) tablet 1,000 mg (1,000 mg Oral Given 01/15/24 0837)  ibuprofen  (ADVIL ) tablet 600 mg (600 mg Oral Given 01/15/24 9161)                                    Medical Decision Making This patient presents to the ED with chief complaint(s) of acute on chronic back pain with pertinent past medical history of prior lumbar surgery, diabetes, homelessness which further complicates the presenting complaint. The complaint involves an extensive differential diagnosis and also carries with it a high risk of complications and morbidity.    The differential diagnosis includes arthritis, chronic pain, sciatica, no focal neurologic deficits saddle anesthesia or loss of bowel or bladder function making cauda equina unlikely, trauma making fracture or dislocation unlikely, no fever making epidural abscess unlikely, malingering  Additional history obtained: Additional history obtained from N/A Records reviewed Care Everywhere/External Records  ED Course and Reassessment: On patient's arrival he was hemodynamically stable in no acute distress.  Was given Tylenol , Motrin  and lidocaine  patch in triage with some improvement.  He is requesting a soda and bus pass.  He was recommended outpatient follow-up and was given strict return precautions.  Independent labs interpretation:  N/A  Independent visualization of imaging: - N/A  Consultation: -  Consulted or discussed management/test interpretation w/ external professional: N/A  Consideration for admission or further workup: Patient has no emergent conditions requiring admission or further work-up at this time and is stable for discharge home with primary care and spine follow-up  Social Determinants of health: homelessness    Risk OTC drugs. Prescription drug management.       Final diagnoses:  Chronic midline low back pain with bilateral sciatica    ED Discharge Orders          Ordered    lidocaine  (LIDODERM ) 5 %  Every 24 hours        01/15/24 1003               Kingsley, Carmela Piechowski K, [place] 01/15/24 1007

## 2024-01-15 NOTE — ED Notes (Signed)
 Pt given a sandwich bag and soda.

## 2024-01-15 NOTE — ED Triage Notes (Signed)
 Pt was seen and discharged from this ED this am for same complaint he is checking in for at this time.  Pt states that he has back and foot pain and can't walk.  Pt arrives ambulatory.

## 2024-01-15 NOTE — Discharge Instructions (Addendum)
 It appears that you have been experiencing this back pain and intermittent numbness and tingling in your feet for many years.  It is important that you follow-up with your orthopedic surgeon and/or with your primary care provider to help with ongoing management for these.  It looks like you were prescribed several medications that could help with neuropathy such as gabapentin  and duloxetine  last month.  Continue using these as well as Tylenol , ibuprofen  and lidocaine  patches to help with discomfort.  Continue checking her feet daily for wounds.  I did not see any wounds or issues on the feet today.

## 2024-01-15 NOTE — Telephone Encounter (Signed)
 Patient came to the clinic this afternoon stating he has nowhere to go and it's cold outside and he doesn't know what to do.  At first he said he had thoughts of killing himself ; but had no plan.  Then later in our conversation, he said he did not have any thought of killing himself.    He explained that he had housing until 01/13/2024. He stated he was staying a tiny basement room and he was upset that he was paying money for the small space.  He had an argument with the landlord and he ( patient) asked for his rent money back and left.  He said that the landlord only gave him back a small amount of money.   I asked if he would be able to return to that home  and he said he wasn't sure. I asked if he wanted me to call the landlord and he said he couldn't remember his number.  He stated again, that the room was too small for him and he was upset and left and I told him but it was a warm place to stay and he said he knows that and he realizes he made a mistake leaving.    He could not remember his [employer] caseworker's name and I told him that I was going to call [employer] and inquire if Behavioral health response team could come help him as he did not want to leave the clinic and kept asking what is he supposed to do?  Where is he supposed to go?    He said he is afraid to go to [facility] because he owes people money and they may attack him.  He is afraid to sleep on the street as he is in pain.  At one point he wrapped himself up in the comforter he had with him and he laid on the floor in the exam room.    I called non-emergent [employer] and explained the situation and they said they would send an officer out .   The officer arrived and spoke to the patient who was laying on the floor at the time.  He denied any suicidal ideation so the officer was not going to take him to [facility] but he would escort him out of the clinic.  The patient asked to call a friend  before he left; but that person did not answer and there was no  voicemail.  I offered to call his sister for him and he refused.   He asked for bus passes which I gave him and he then left with the officer.

## 2024-01-15 NOTE — ED Provider Triage Note (Signed)
 Emergency Medicine Provider Triage Evaluation Note  David Wyatt , a 63 y.o. male  was evaluated in triage.  Pt complains of back pain. Reported body aches from the cold SI to triage but denied this to me. States he has a history of back surgery several years ago and gets pain in his back from the hardware and arthritis when the weather changes. Has not seen his surgeon or taken home meds. Reports sciatica bilaterally. Denies trauma, saddle anesthesia, loss of bowel/bladder function or numbness/weakness.  Review of Systems  Positive: Back pain Negative: Numbness, weakness  Physical Exam  BP 127/77   Pulse 71   Temp 98.2 F (36.8 C) (Oral)   Resp 16   Ht 5' 8 (1.727 m)   Wt 72.6 kg   SpO2 99%   BMI 24.33 kg/m  Gen:   Awake, no distress, disheveled appearing Resp:  Normal effort  MSK:   Moves extremities without difficulty  Other:  Sensation intact in bilateral LE, 5/5 strength in hip flexion/knee extension, plantar/dorsiflexion. Tenderness to palpation of mid-lower back long surgical scar, skin well healed  Medical Decision Making  Medically screening exam initiated at 8:30 AM.  Appropriate orders placed.  David Wyatt was informed that the remainder of the evaluation will be completed by another provider, this initial triage assessment does not replace that evaluation, and the importance of remaining in the ED until their evaluation is complete.  Given pain medications   David Wyatt, David Wyatt, [place] 01/15/24 9154

## 2024-01-16 ENCOUNTER — Encounter (HOSPITAL_COMMUNITY): Payer: Self-pay

## 2024-01-16 ENCOUNTER — Emergency Department (HOSPITAL_COMMUNITY)
Admission: EM | Admit: 2024-01-16 | Discharge: 2024-01-17 | Disposition: A | Discharge status: Home / Self Care | Payer: MEDICAID | Attending: Emergency Medicine | Admitting: Emergency Medicine

## 2024-01-16 ENCOUNTER — Other Ambulatory Visit: Payer: Self-pay

## 2024-01-16 DIAGNOSIS — M79671 Pain in right foot: Secondary | ICD-10-CM | POA: Diagnosis not present

## 2024-01-16 DIAGNOSIS — R45851 Suicidal ideations: Secondary | ICD-10-CM | POA: Insufficient documentation

## 2024-01-16 DIAGNOSIS — Z7984 Long term (current) use of oral hypoglycemic drugs: Secondary | ICD-10-CM | POA: Diagnosis not present

## 2024-01-16 DIAGNOSIS — Z79899 Other long term (current) drug therapy: Secondary | ICD-10-CM | POA: Diagnosis not present

## 2024-01-16 DIAGNOSIS — Z765 Malingerer [conscious simulation]: Secondary | ICD-10-CM

## 2024-01-16 DIAGNOSIS — E1140 Type 2 diabetes mellitus with diabetic neuropathy, unspecified: Secondary | ICD-10-CM | POA: Diagnosis not present

## 2024-01-16 DIAGNOSIS — Z5900 Homelessness unspecified: Secondary | ICD-10-CM | POA: Diagnosis not present

## 2024-01-16 DIAGNOSIS — Z87891 Personal history of nicotine dependence: Secondary | ICD-10-CM | POA: Diagnosis not present

## 2024-01-16 DIAGNOSIS — F1410 Cocaine abuse, uncomplicated: Secondary | ICD-10-CM | POA: Diagnosis present

## 2024-01-16 DIAGNOSIS — Z5901 Sheltered homelessness: Secondary | ICD-10-CM

## 2024-01-16 DIAGNOSIS — G8929 Other chronic pain: Secondary | ICD-10-CM | POA: Diagnosis not present

## 2024-01-16 DIAGNOSIS — Z794 Long term (current) use of insulin: Secondary | ICD-10-CM | POA: Insufficient documentation

## 2024-01-16 DIAGNOSIS — R739 Hyperglycemia, unspecified: Secondary | ICD-10-CM | POA: Diagnosis present

## 2024-01-16 DIAGNOSIS — M79672 Pain in left foot: Secondary | ICD-10-CM | POA: Diagnosis present

## 2024-01-16 DIAGNOSIS — I10 Essential (primary) hypertension: Secondary | ICD-10-CM | POA: Diagnosis not present

## 2024-01-16 DIAGNOSIS — R7309 Other abnormal glucose: Secondary | ICD-10-CM | POA: Diagnosis not present

## 2024-01-16 LAB — COMPREHENSIVE METABOLIC PANEL WITH GFR
ALT: 22 U/L (ref 0–44)
AST: 18 U/L (ref 15–41)
Albumin: 3.4 g/dL — ABNORMAL LOW (ref 3.5–5.0)
Alkaline Phosphatase: 109 U/L (ref 38–126)
Anion gap: 12 (ref 5–15)
BUN: 13 mg/dL (ref 8–23)
CO2: 24 mmol/L (ref 22–32)
Calcium: 8.8 mg/dL — ABNORMAL LOW (ref 8.9–10.3)
Chloride: 99 mmol/L (ref 98–111)
Creatinine, Ser: 1.17 mg/dL (ref 0.61–1.24)
GFR, Estimated: >60 mL/min (ref >60)
Glucose, Bld: 481 mg/dL — ABNORMAL HIGH (ref 70–99)
Potassium: 4.3 mmol/L (ref 3.5–5.1)
Sodium: 135 mmol/L (ref 135–145)
Total Bilirubin: 1 mg/dL (ref 0.0–1.2)
Total Protein: 7 g/dL (ref 6.5–8.1)

## 2024-01-16 LAB — RAPID URINE DRUG SCREEN, HOSP PERFORMED
Amphetamines: NOT DETECTED
Barbiturates: NOT DETECTED
Benzodiazepines: NOT DETECTED
Cocaine: POSITIVE — AB
Opiates: NOT DETECTED
Tetrahydrocannabinol: NOT DETECTED

## 2024-01-16 LAB — CBG MONITORING, ED
Glucose-Capillary: 567 mg/dL (ref 70–99)
Glucose-Capillary: 526 mg/dL (ref 70–99)
Glucose-Capillary: 420 mg/dL — ABNORMAL HIGH (ref 70–99)

## 2024-01-16 LAB — I-STAT VENOUS BLOOD GAS, ED
Acid-base deficit: 1 mmol/L (ref 0.0–2.0)
Bicarbonate: 23.2 mmol/L (ref 20.0–28.0)
Calcium, Ion: 1.15 mmol/L (ref 1.15–1.40)
HCT: 43 % (ref 39.0–52.0)
Hemoglobin: 14.6 g/dL (ref 13.0–17.0)
O2 Saturation: 85 %
Potassium: 4.5 mmol/L (ref 3.5–5.1)
Sodium: 131 mmol/L — ABNORMAL LOW (ref 135–145)
TCO2: 24 mmol/L (ref 22–32)
pCO2, Ven: 36 mmHg — ABNORMAL LOW (ref 44–60)
pH, Ven: 7.418 (ref 7.25–7.43)
pO2, Ven: 49 mmHg — ABNORMAL HIGH (ref 32–45)

## 2024-01-16 LAB — URINALYSIS, ROUTINE W REFLEX MICROSCOPIC
Bacteria, UA: NONE SEEN
Bilirubin Urine: NEGATIVE
Glucose, UA: >=500 mg/dL — AB
Hgb urine dipstick: NEGATIVE
Ketones, ur: NEGATIVE mg/dL
Leukocytes,Ua: NEGATIVE
Nitrite: NEGATIVE
Protein, ur: NEGATIVE mg/dL
Specific Gravity, Urine: 1.029 (ref 1.005–1.030)
pH: 5 (ref 5.0–8.0)

## 2024-01-16 LAB — BETA-HYDROXYBUTYRIC ACID: Beta-Hydroxybutyric Acid: 0.08 mmol/L (ref 0.05–0.27)

## 2024-01-16 LAB — CBC
HCT: 41.3 % (ref 39.0–52.0)
Hemoglobin: 13.4 g/dL (ref 13.0–17.0)
MCH: 30.5 pg (ref 26.0–34.0)
MCHC: 32.4 g/dL (ref 30.0–36.0)
MCV: 94.1 fL (ref 80.0–100.0)
Platelets: 383 K/uL (ref 150–400)
RBC: 4.39 MIL/uL (ref 4.22–5.81)
RDW: 12.7 % (ref 11.5–15.5)
WBC: 5.4 K/uL (ref 4.0–10.5)
nRBC: 0 % (ref 0.0–0.2)

## 2024-01-16 LAB — ETHANOL: Alcohol, Ethyl (B): <15 mg/dL (ref <15)

## 2024-01-16 MED ORDER — GABAPENTIN 300 MG PO CAPS
300.0000 mg | ORAL_CAPSULE | Freq: Once | ORAL | Status: AC
  Administered 2024-01-16: 300 mg via ORAL
  Filled 2024-01-16: qty 1

## 2024-01-16 MED ORDER — INSULIN ASPART 100 UNIT/ML IJ SOLN
10.0000 [IU] | Freq: Once | INTRAMUSCULAR | Status: DC
  Filled 2024-01-16: qty 10

## 2024-01-16 MED ORDER — SODIUM CHLORIDE 0.9 % IV BOLUS: 1000.0000 mL | Freq: Once | INTRAVENOUS | Status: DC

## 2024-01-16 MED ORDER — ONDANSETRON HCL 4 MG PO TABS
4.0000 mg | ORAL_TABLET | Freq: Three times a day (TID) | ORAL | Status: DC | PRN
  Administered 2024-01-16: 4 mg via ORAL
  Filled 2024-01-16: qty 1

## 2024-01-16 MED ORDER — INSULIN ASPART 100 UNIT/ML IJ SOLN
10.0000 [IU] | Freq: Once | INTRAMUSCULAR | Status: AC
  Administered 2024-01-16: 10 [IU] via SUBCUTANEOUS
  Filled 2024-01-16: qty 10

## 2024-01-16 MED ORDER — INSULIN ASPART 100 UNIT/ML IJ SOLN
5.0000 [IU] | Freq: Once | INTRAMUSCULAR | Status: AC
  Administered 2024-01-16: 5 [IU] via SUBCUTANEOUS

## 2024-01-16 MED ORDER — ACETAMINOPHEN 325 MG PO TABS
650.0000 mg | ORAL_TABLET | ORAL | Status: DC | PRN
Start: 2024-01-16 — End: 2024-01-17
  Administered 2024-01-16 – 2024-01-17 (×2): 650 mg via ORAL
  Filled 2024-01-16 (×2): qty 2

## 2024-01-16 MED ORDER — INSULIN ASPART 100 UNIT/ML IJ SOLN: 7.0000 [IU] | Freq: Once | INTRAMUSCULAR | Status: DC

## 2024-01-16 NOTE — ED Provider Notes (Signed)
 [place] EMERGENC[facility] DEPARTMENT AT [facility] Provider Note  CSN: [id number] Arrival date & time: 01/16/24 1129  Chief Complaint(s) No chief complaint on file.  HPI David Wyatt is a 63 y.o. male with past medical history as below, significant for chronic back pain, chronic cocaine abuse, hypertension, type II DM, homeless who presents to the ED with complaint of suicidal ideation  Patient has been seen twice in past day.  He was complaining of back pain in triage but no longer reporting any back pain.  He reports that he is suicidal, has vague plan to harm himself.  He is somewhat vague on his specific plans to kill himself and is requesting a hot meal.  He reports he has tried to kill himself in the past with pills and cutting.  Reports that he is tired of being in pain and tired of being homeless.  Reports he has nowhere to go and is feeling very depressed 2/2 homelessness.  Denies any homicidal ideation, no hallucinations.  Denies any sort of intentional harmful ingestion prior to arrival  Past Medical History Past Medical History:  Diagnosis Date   Angioedema of lips 07/28/2012   left upper (07/29/2012)   Arthritis    hands and back   Chronic back pain    Cocaine abuse (HCC)    Diabetic neuropathy (HCC)    High cholesterol    Hypertension    Neuropathy    Type II diabetes mellitus (HCC)    Patient Active Problem List   Diagnosis Date Noted   Depressive disorder 12/05/2023   Suicidal ideation 12/02/2023   Deficient knowledge of community resources 12/02/2023   Nausea vomiting and diarrhea 11/27/2023   Abdominal pain 11/27/2023   Chronic health problem 10/22/2023   AKI (acute kidney injury) 10/18/2023   Hyperglycemia 10/18/2023   Hyperglycemia due to diabetes mellitus (HCC) 09/27/2023   Malingering 07/15/2023   MDD (major depressive disorder), recurrent episode, severe (HCC) 05/19/2023   MDD (major depressive disorder), recurrent episode, mild 07/16/2022   MDD  (major depressive disorder), recurrent severe, 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   Substance induced mood disorder (HCC) 06/18/2022   Homelessness 06/18/2022   Inadequate community resources 06/18/2022   Inability to access community resources due to transportation insecurity 06/18/2022   Cocaine use disorder (HCC) 03/03/2022   Cocaine use disorder, severe, dependence (HCC) 01/21/2022   Suicidal behavior 01/20/2022   Acute respiratory failure with hypoxia (HCC) 10/13/2021   History of cocaine use 07/23/2021   Chronic right shoulder pain 07/23/2021   Renal stones 07/23/2021   Dermoid cyst of skin of back 07/23/2021   Uncontrolled type 2 diabetes mellitus with hyperglycemia (HCC) 03/22/2021   Left kidney mass 03/22/2021   Hydronephrosis of right kidney 02/23/2021   Type 2 diabetes mellitus with hyperglycemia (HCC) 02/23/2021   Tobacco abuse 08/19/2018   Hyperlipidemia 04/24/2017   GERD (gastroesophageal reflux disease) 06/13/2015   Diabetic neuropathy (HCC) 05/01/2015   Essential hypertension, benign 11/01/2013   Lumbar disc herniation 09/13/2013   Constipation 09/13/2013   Chronic back pain 09/13/2013   Felon of finger of left hand with lymphangitis 12/13/2012   Crack cocaine use 07/29/2012   Arthritis 07/29/2012   Weakness generalized 07/29/2012   Home Medication(s) Prior to Admission medications   Medication Sig Start Date End Date Taking? Authorizing Provider  Accu-Chek Softclix Lancets lancets Use to check blood sugar three times daily as directed 11/01/23   Shrivastava, Aryendra, MD  Blood Glucose Monitoring Suppl (ACCU-CHEK GUIDE ME) w/Device KIT  Use to test blood sugar 3 times daily. 11/01/23   Shrivastava, Aryendra, MD  DULoxetine  (CYMBALTA ) 30 MG capsule Take 3 capsules (90 mg total) by mouth daily. 12/13/23   Donnelly Mellow, MD  gabapentin  (NEURONTIN ) 300 MG capsule Take 1 capsule (300 mg total) by mouth 3 (three) times daily. 12/12/23   Donnelly Mellow, MD  glucose blood  (ACCU-CHEK GUIDE TEST) test strip Use as instructed to check blood sugar three times daily 11/01/23   Shrivastava, Aryendra, MD  insulin  aspart (NOVOLOG ) 100 UNIT/ML FlexPen Inject 3 Units into the skin 3 (three) times daily with meals. 12/27/23   Zelaya, Oscar A, PA-C  insulin  glargine (LANTUS ) 100 UNIT/ML Solostar Pen Inject 12 Units into the skin at bedtime. 12/27/23   Zelaya, Oscar A, PA-C  Insulin  Pen Needle 32G X 4 MM MISC Use 1 each to inject insulin  into the skin 4 (four) times daily -  before meals and at bedtime. 12/28/23   Zelaya, Oscar A, PA-C  lidocaine  (LIDODERM ) 5 % Place 1 patch onto the skin daily. Remove & Discard patch within 12 hours or as directed by MD 01/15/24   Kingsley, Victoria K, DO  metFORMIN  (GLUCOPHAGE ) 1000 MG tablet Take 1 tablet (1,000 mg total) by mouth 2 (two) times daily with a meal. 12/12/23   Donnelly Mellow, MD  ondansetron  (ZOFRAN ) 4 MG tablet Take 1 tablet (4 mg total) by mouth every 6 (six) hours. 12/16/23   Robinson, John K, PA-C  pantoprazole  (PROTONIX ) 40 MG tablet Take 1 tablet (40 mg total) by mouth daily. 11/30/23   Rosario Leatrice FERNS, MD  traZODone  (DESYREL ) 50 MG tablet Take 1 tablet (50 mg total) by mouth at bedtime as needed for sleep. 12/12/23   Donnelly Mellow, MD                                                                                                                                    Past Surgical History Past Surgical History:  Procedure Laterality Date   BACK SURGERY     CYSTOSCOPY W/ URETERAL STENT PLACEMENT Right 02/23/2021   Procedure: CYSTOSCOPY WITH RETROGRADE PYELOGRAM/URETERAL STENT PLACEMENT;  Surgeon: Selma Donnice SAUNDERS, MD;  Location: [facility];  Service: Urology;  Laterality: Right;   CYSTOSCOPY/URETEROSCOPY/HOLMIUM LASER/STENT PLACEMENT Right 12/05/2022   Procedure: CYSTOSCOPY, RIGHT URETEROSCOPY, AND RIGHT URETERAL STENT PLACEMENT;  Surgeon: Shane Steffan BROCKS, MD;  Location: [facility];  Service: Urology;  Laterality: Right;  90 MINUTES    HERNIA REPAIR Right 04/01/2012   I & D EXTREMITY Left 12/13/2012   Procedure: IRRIGATION AND DEBRIDEMENT LEFT THUMB;  Surgeon: Franky SAUNDERS Curia, MD;  Location: [facility] OR;  Service: Orthopedics;  Laterality: Left;   INCISION AND DRAINAGE OF WOUND     boil on back/notes 07/14/2008 (07/29/2012)   INGUINAL HERNIA REPAIR  04/01/2012   Procedure: HERNIA REPAIR INGUINAL ADULT;  Surgeon: Sherlean JINNY Laughter, MD;  Location: [facility] OR;  Service: General;  Laterality: Right;   INGUINAL HERNIA REPAIR Left 09/06/2016   Procedure: OPEN REPAIR LEFT INGUINAL HERNIA;  Surgeon: Tanda Locus, MD;  Location: [facility] OR;  Service: General;  Laterality: Left;   INSERTION OF MESH Left 09/06/2016   Procedure: INSERTION OF MESH;  Surgeon: Tanda Locus, MD;  Location: [facility] OR;  Service: General;  Laterality: Left;   TONSILLECTOMY     Family History Family History  Problem Relation Age of Onset   Diabetes Mother    Kidney disease Mother    Hyperlipidemia Mother    Hyperlipidemia Father    Diabetes Father     Social History Social History   Tobacco Use   Smoking status: Former    Current packs/day: 0.25    Average packs/day: 0.3 packs/day for 30.0 years (7.5 ttl pk-yrs)    Types: Cigarettes   Smokeless tobacco: Former    Quit date: 08/15/2015   Tobacco comments:    04/29/16 2  cigs daily, 10/27/17 sometimes < .25 PPD  Vaping Use   Vaping status: Never Used  Substance Use Topics   Alcohol use: Not Currently   Drug use: Not Currently   Allergies Lisinopril   Review of Systems A thorough review of systems was obtained and all systems are negative except as noted in the HPI and PMH.   Physical Exam Vital Signs  I have reviewed the triage vital signs BP (!) 152/113 (BP Location: Right Arm)   Pulse 90   Temp 98.4 F (36.9 C)   Resp 18   Ht 5' 8 (1.727 m)   Wt 72.6 kg   SpO2 97%   BMI 24.34 kg/m  Physical Exam Vitals and nursing note reviewed.  Constitutional:      General: He is not in acute distress.     Appearance: Normal appearance. He is well-developed. He is not ill-appearing.  HENT:     Head: Normocephalic and atraumatic.     Right Ear: External ear normal.     Left Ear: External ear normal.     Nose: Nose normal.     Mouth/Throat:     Mouth: Mucous membranes are moist.  Eyes:     General: No scleral icterus.       Right eye: No discharge.        Left eye: No discharge.  Cardiovascular:     Rate and Rhythm: Normal rate.  Pulmonary:     Effort: Pulmonary effort is normal. No respiratory distress.     Breath sounds: No stridor.  Abdominal:     General: Abdomen is flat. There is no distension.     Tenderness: There is no guarding.  Musculoskeletal:        General: No deformity.     Cervical back: No rigidity.  Skin:    General: Skin is warm and dry.     Coloration: Skin is not cyanotic, jaundiced or pale.  Neurological:     Mental Status: He is alert.  Psychiatric:        Attention and Perception: He does not perceive auditory or visual hallucinations.        Mood and Affect: Mood is depressed.        Speech: Speech normal.        Behavior: Behavior normal. Behavior is cooperative.        Thought Content: Thought content is not paranoid. Thought content includes suicidal ideation. Thought content does not include homicidal ideation. Thought content includes suicidal plan. Thought content does not include homicidal  plan.     ED Results and Treatments Labs (all labs ordered are listed, but only abnormal results are displayed) Labs Reviewed  COMPREHENSIVE METABOLIC PANEL WITH GFR - Abnormal; Notable for the following components:      Result Value   Glucose, Bld 481 (*)    Calcium  8.8 (*)    Albumin 3.4 (*)    All other components within normal limits  RAPID URINE DRUG SCREEN, HOSP PERFORMED - Abnormal; Notable for the following components:   Cocaine POSITIVE (*)    All other components within normal limits  ETHANOL  CBC                                                                                                                           Radiology No results found.  Pertinent labs & imaging results that were available during my care of the patient were reviewed by me and considered in my medical decision making (see MDM for details).  Medications Ordered in ED Medications  acetaminophen  (TYLENOL ) tablet 650 mg (has no administration in time range)  ondansetron  (ZOFRAN ) tablet 4 mg (has no administration in time range)                                                                                                                                     Procedures Procedures  (including critical care time)  Medical Decision Making / ED Course    Medical Decision Making:    Damarcus Reggio is a 63 y.o. male with past medical history as below, significant for chronic back pain, chronic cocaine abuse, hypertension, type II DM, homeless who presents to the ED with complaint of suicidal ideation. The complaint involves an extensive differential diagnosis and also carries with it a high risk of complications and morbidity.  Serious etiology was considered. Ddx includes but is not limited to: Substance-induced psychiatric disorder, depression, malingering, etc.  Complete initial physical exam performed, notably the patient was in no acute distress.    Reviewed and confirmed nursing documentation for past medical history, family history, social history.  Vital signs reviewed.    Suicidal ideation w/o plan Homeless > - Patient reports he is acutely suicidal, he has plan to harm himself but is vague on his exact plan.  He reports that he has tried to harm self in the past with cutting and ingestion - he is not  psychotic, does not appear intoxicated  - no clear plan - UDS positive for cocaine, labs otherwise are reassuring - Patient has poor support system, homeless, substance abuse, does not follow regularly with primary care or psychiatric care.  - he is  tolerating po, no medical complaints offered, no back pain reported >  medically cleared at this time - will place patient in psych hold and consult TTS, he is currently voluntary       Dispo pending TTS               Additional history obtained: -Additional history obtained from na -External records from outside source obtained and reviewed including: Chart review including previous notes, labs, imaging, consultation notes including  Prior er eval Prior labs   Lab Tests: -I ordered, reviewed, and interpreted labs.   The pertinent results include:   Labs Reviewed  COMPREHENSIVE METABOLIC PANEL WITH GFR - Abnormal; Notable for the following components:      Result Value   Glucose, Bld 481 (*)    Calcium  8.8 (*)    Albumin 3.4 (*)    All other components within normal limits  RAPID URINE DRUG SCREEN, HOSP PERFORMED - Abnormal; Notable for the following components:   Cocaine POSITIVE (*)    All other components within normal limits  ETHANOL  CBC    Notable for cocaine +tive  EKG   EKG Interpretation Date/Time:    Ventricular Rate:    PR Interval:    QRS Duration:    QT Interval:    QTC Calculation:   R Axis:      Text Interpretation:           Imaging Studies ordered: na   Medicines ordered and prescription drug management: Meds ordered this encounter  Medications   acetaminophen  (TYLENOL ) tablet 650 mg   ondansetron  (ZOFRAN ) tablet 4 mg    -I have reviewed the patients home medicines and have made adjustments as needed   Consultations Obtained: I requested consultation with the TTS   Cardiac Monitoring: Continuous pulse oximetry interpreted by myself, 97% on RA.    Social Determinants of Health:  Diagnosis or treatment significantly limited by social determinants of health: former smoker, polysubstance abuse, and homelessness   Reevaluation: After the interventions noted above, I reevaluated the patient and found that they have  stayed the same  Co morbidities that complicate the patient evaluation  Past Medical History:  Diagnosis Date   Angioedema of lips 07/28/2012   left upper (07/29/2012)   Arthritis    hands and back   Chronic back pain    Cocaine abuse (HCC)    Diabetic neuropathy (HCC)    High cholesterol    Hypertension    Neuropathy    Type II diabetes mellitus (HCC)       Dispostion: Disposition decision including need for hospitalization was considered, and patient disposition pending at time of sign out.    Final Clinical Impression(s) / ED Diagnoses Final diagnoses:  Cocaine abuse (HCC)  Suicidal ideation        Elnor Jayson LABOR, DO 01/16/24 1435

## 2024-01-16 NOTE — ED Notes (Signed)
 Pt wanting food and drink. Says he hasn't had anything to eat in hours

## 2024-01-16 NOTE — ED Notes (Signed)
 [employer] states that the family wants to be contacted prior to discharge and wants to speak with social work about finding him resources.  Family is supposed to meet him here due to it being hard to find him when he leaves

## 2024-01-16 NOTE — ED Notes (Signed)
 Sitter gave patient soda

## 2024-01-16 NOTE — ED Notes (Signed)
 Room arranged for safety, all belongings placed in designated area locker 3, pt changed into paper scrubs, wanded by security, sitter at bedside.

## 2024-01-16 NOTE — ED Provider Notes (Signed)
 7:18 PM Patient requested his sugar to be checked. BG 567 mg/dL. He is type II diabetic and has not received his medication.  Will obtain other labs.  Clinical Course as of 01/20/24 0806  Fri Jan 16, 2024  1918 Glucose-Capillary(!!): 567 [HN]  2312 Labs show no DKA. Beta hydroxybutyrate is neg, no acidosis. He was given 10 U insulin  and glucose decreased to [HN]  2340 Glucose-Capillary(!): 420 Will give another 5 U novolog  [HN]    Clinical Course User Index [HN] Franklyn Sid SAILOR, MD   Patient is signed out to the oncoming ED PA Sponseller who is made aware of his history, presentation, exam, workup, and plan.      Franklyn Sid SAILOR, MD 01/20/24 [phone]

## 2024-01-16 NOTE — ED Provider Notes (Signed)
 Contacted by purple ED RN with request for assessment of patient's blood sugar which continues to be greater than 500.  Patient is homeless, did have labs around 8 PM tonight, 2 hours ago, with reassuring VBG and beta hydroxy. No evidence of hyperglycemic crisis. Will give dose of subcu insulin .  This chart was dictated using voice recognition software, Dragon. Despite the best efforts of this provider to proofread and correct errors, errors may still occur which can change documentation meaning.     Shatira Dobosz R, PA-C 01/16/24 2224    Patt Alm Macho, MD 01/16/24 2329

## 2024-01-16 NOTE — Consult Note (Addendum)
 [facility] Psychiatric Consult Initial  Patient Name: .David Wyatt  MRN: [id number]  DOB: Mar 22, 1960  Consult Order details:  Orders (From admission, onward)     Start     Ordered   01/16/24 1421  CONSULT TO CALL ACT TEAM       Ordering Provider: Elnor Jayson LABOR, DO  Provider:  (Not yet assigned)  Question:  Reason for Consult?  Answer:  SI   01/16/24 1420             Mode of Visit: In person    Psychiatry Consult Evaluation  Service Date: January 16, 2024 LOS:  LOS: 0 days  Chief Complaint: Suicidal ideations   Primary Psychiatric Diagnoses  Malingering 2.  Homelessness  Assessment   David Wyatt is a 63 y.o. AA male with a past psychiatric history of severe polysubstance abuse, malingering, MDD, and substance-induced mood disorder, with pertinent medical comorbidities/history that include chronic pain, GERD, hyperlipidemia, essential hypertension, and diabetes, who presented this encounter by way of self with endorsements of suicidal ideations, who upon EDP evaluation, consulted psychiatry for specialty evaluation and recommendations.  Patient is currently voluntary this time, as well as medically clear, per EDP team.  Patient is well-known to the behavioral health and EDP service line; has recently been seen several times within the last few days for the same complaints.  Upon evaluation, patient presents with symptomology that is most consistent with malingering in the context of homelessness and having no shelter or resources.  Evidence of this is appreciable from brief engagement, where despite being given encouragement to participate in examination, patient endorsed that he was not amenable to evaluation, because he states that he has no desire to leave the sheltering environment at the emergency department, and that giving evaluation to this provider would he states lead to discharge, and that again, he has no desire he states to leave the emergency department, unless  he is found shelter.   This is incongruent however because, it was attempted to discuss with the patient a variety of shelter options that are available within the community, as well as it was attempted to discussed with the patient that the case management team could help facilitate negotiating with his landlord to get his residence back as best that could be done within the environment of the emergency department, but patient endorsed that he had no desire for collaborating with the emergency department team, including this provider, thus patient was instructed that he would be placed and recommended for psychiatric clearance, as well as given the additional recommendations listed below, to which patient endorsed angrily he was not happy about this and terminated our engagement.  Patient's prognosis is guarded, given chronic and pervasive desires to inappropriately utilize emergency services, versus working with the health care team to help the patient obtain shelter and resources, to hopefully and ideally, ultimately be successful.  Spoke with Dr. Merilee who is in agreement with recommendation for psychiatric clearance, as well as additional recommendations listed below.  Diagnoses:  Active Hospital problems: Principal Problem:   Malingering Active Problems:   Homelessness    Plan   #Malingering # Homelessness  ## Psychiatric Recommendations:   - Recommend shelter resources to be given upon discharge, for if/when he becomes amenable to utilizing them - Recommend safety return precautions - Recommend consider TOC consult to help facilitate possible additional resources for the patient  ## Medical Decision Making Capacity: Not specifically addressed in this encounter  ## Further Work-up: None at  this time  ## Disposition:-- There are no psychiatric contraindications to discharge at this time  ## Behavioral / Environmental: -Routine precautions until discharge; safety return  precautions upon discharge    ## Safety and Observation Level:  - Based on my clinical evaluation, I estimate the patient to be at low risk of self harm in the current setting and upon recommendation for discharge. - At this time, we recommend  routine. This decision is based on my review of the chart including patient's history and current presentation, interview of the patient, mental status examination, and consideration of suicide risk including evaluating suicidal ideation, plan, intent, suicidal or self-harm behaviors, risk factors, and protective factors. This judgment is based on our ability to directly address suicide risk, implement suicide prevention strategies, and develop a safety plan while the patient is in the clinical setting. Please contact our team if there is a concern that risk level has changed.  CSSR Risk Category:C-SSRS RISK CATEGORY: High Risk  Suicide Risk Assessment: Patient has following modifiable risk factors for suicide: untreated depression, social isolation, medication noncompliance, lack of access to outpatient mental health resources, triggering events, recent psychiatric hospitalization, and pain, medical illness (ie new dx of cancer), which we are addressing by recommendations. Patient has following non-modifiable or demographic risk factors for suicide: male gender, separation or divorce, history of suicide attempt, and psychiatric hospitalization Patient has the following protective factors against suicide: Access to outpatient mental health care, Supportive family, Frustration tolerance, and no history of NSSIB  Thank you for this consult request. Recommendations have been communicated to the primary team.  We will sign off at this time.   Jerel JINNY Gravely, NP   History of Present Illness   David Wyatt is a 63 y.o. AA male with a past psychiatric history of severe polysubstance abuse, malingering, MDD, and substance-induced mood disorder, with pertinent  medical comorbidities/history that include chronic pain, GERD, hyperlipidemia, essential hypertension, and diabetes, who presented this encounter by way of self with endorsements of suicidal ideations, who upon EDP evaluation, consulted psychiatry for specialty evaluation and recommendations.  Patient is currently voluntary this time, as well as medically clear, per EDP team.  Patient seen today at the [facility] emergency department for face-to-face psychiatric evaluation.  Upon approach, patient attempted to be seen, but endorsed immediately, in a very guarded, angry, and intently related interpersonal style, that he was not amenable to examination, because he states that he has no desire to leave the sheltering environment of the Emergency Department, because he states further that giving evaluation to this provider would lead to him being discharged, and that again, he has no desire to to leave the emergency department, unless he has found shelter he states.  This is incongruent however because, in addition to being encouraged to participate in examination, patient was then prompted and given encouragement numerous times to discuss a variety of shelter options that are available within the community, as well as it was attempted to discuss with the patient that the case management team could help facilitate negotiating with his landlord to get his residence back as best that could be done within the environment of the emergency department (see 01/15/2024 CM Note), but patient endorsed that he had no desire for collaborating with the emergency department team, including this provider, thus patient was instructed that he would be placed and recommended for psychiatric clearance, as well as given the additional recommendations listed below, to which patient endorsed angrily he was not happy  about this and terminated our engagement.  In addition to presenting with a very angry, guarded, and intently related  for interpersonal style, patient presented with a congruent angry affect, with limited eye contact.  Patient orientation however was grossly intact, and he presented without concerns for fluctuations in consciousness, or evidence to suggest that the patient is decompensated into psychosis, lacking of capacity, intoxicated, or experiencing some sort of delirium etiology.  Patient additionally did not appear in any physical distress or pain.  Review of Systems  Unable to perform ROS: Other (Refused)     Psychiatric and Social History  Psychiatric History:   Information collected from patient, chart review   Prev Dx/Sx: MDD, severe, recurrent. Stimulant use disorder (cocaine). 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   Prior Psych Hospitalization: yes  Prior Self Harm: no NSSIB, previous attempts to overdose on insulin . Prior Violence: denies history   Family Psych History: denies Family Hx suicide: denies   Social History:  Developmental Hx: Born/raised in [place], [place]. Moved to [place] ~40 years ago. Educational Hx: attended 12th grade, did not graduate Occupational Hx:  Legal Hx: denies any convictions Living Situation: homeless, unsheltered. Spiritual Hx: christian. Access to weapons/lethal means: If I had a firearm, I would have used it on myself already.    Substance use history: Alcohol: Denies any current use History of alcohol withdrawal seizures: denies History of DT's: denies Tobacco: 1 pack per day, 20 year history Illicit drugs:  Marijuana/THC: denies significant history Cocaine: Started at age 8. 25 year history. Longest period of sobriety was for 1 year after graduating from [employer]. Most recent use was between 1-2 months ago. UDS negative at  admission.  Has not used in months. Methamphetamines: Never Benzodiazepines: Never Opioids: Denies significant or recent history Hallucinogens: Never Bath salts: Never Prescription Drug abuse problems: Denies Rehab hx: several programs in the past. None recent  Exam Findings  Physical Exam: As below Vital Signs:  Temp:  [98.4 F (36.9 C)-98.5 F (36.9 C)] 98.5 F (36.9 C) (11/07 1555) Pulse Rate:  [69-90] 69 (11/07 1555) Resp:  [18] 18 (11/07 1555) BP: (131-152)/(68-113) 131/68 (11/07 1555) SpO2:  [95 %-97 %] 95 % (11/07 1555) Weight:  [72.6 kg] 72.6 kg (11/07 1206) Blood pressure 131/68, pulse 69, temperature 98.5 F (36.9 C), temperature source Oral, resp. rate 18, height 5' 8 (1.727 m), weight 72.6 kg, SpO2 95%. Body mass index is 24.34 kg/m.  Physical Exam Vitals and nursing note reviewed.  Constitutional:      General: He is not in acute distress.    Appearance: Normal appearance. He is normal weight. He is not ill-appearing, toxic-appearing or diaphoretic.     Comments: Irritable, guarded, and intently related interpersonal style   Pulmonary:     Effort: Pulmonary effort is normal.  Skin:    General: Skin is dry.  Neurological:     Mental Status: He is alert.     Motor: No weakness, tremor or seizure activity.     Comments: Orientation is appreciably grossly intact, without concerns for fluctuations in consciousness.  Psychiatric:        Attention and Perception: He is inattentive.        Mood and Affect: Affect is angry.        Behavior: Behavior is uncooperative, agitated and  aggressive.        Judgment: Judgment is impulsive and inappropriate.     Comments: Attention and perception: No appreciable evidence of responding to internal and/or external stimuli Mood: Does not endorse mood, but is appreciably angry Speech: intent, abrupt, curt, minimal  Cognition and memory: Grossly intact Thought content: Short, minimal, perseverative      Mental Status  Exam: General Appearance: Disheveled African-American male with normal bulk and tone with poor hygiene and grooming in scrubs with irritable, guarded, and intently related interpersonal style  Orientation: Grossly intact  Memory: Grossly intact  Concentration: Inattentive  Recall:  Unable to assess  Attention  Poor  Eye Contact: Limited  Speech:  curt  Language:  Fair  Volume:  Increased  Mood: Angry  Affect:  Congruent  Thought Process:  Coherent and Goal Directed  Thought Content:  Logical  Suicidal Thoughts: None endorsed  Homicidal Thoughts: None endorsed  Judgement:  Intact, but poor  Insight:  Lacking  Psychomotor Activity:  Normal  Akathisia:  No  Fund of Knowledge:  Grossly intact      Assets:  Desire for Improvement Leisure Time Physical Health Resilience  Cognition: Grossly intact  ADL's:  Intact  AIMS (if indicated):   0     Other History   These have been pulled in through the EMR, reviewed, and updated if appropriate.  Family History:  The patient's family history includes Diabetes in his father and mother; Hyperlipidemia in his father and mother; Kidney disease in his mother.  Medical History: Past Medical History:  Diagnosis Date   Angioedema of lips 07/28/2012   left upper (07/29/2012)   Arthritis    hands and back   Chronic back pain    Cocaine abuse (HCC)    Diabetic neuropathy (HCC)    High cholesterol    Hypertension    Neuropathy    Type II diabetes mellitus (HCC)     Surgical History: Past Surgical History:  Procedure Laterality Date   BACK SURGERY     CYSTOSCOPY W/ URETERAL STENT PLACEMENT Right 02/23/2021   Procedure: CYSTOSCOPY WITH RETROGRADE PYELOGRAM/URETERAL STENT PLACEMENT;  Surgeon: Selma Donnice SAUNDERS, MD;  Location: [facility];  Service: Urology;  Laterality: Right;   CYSTOSCOPY/URETEROSCOPY/HOLMIUM LASER/STENT PLACEMENT Right 12/05/2022   Procedure: CYSTOSCOPY, RIGHT URETEROSCOPY, AND RIGHT URETERAL STENT PLACEMENT;  Surgeon:  Shane Steffan BROCKS, MD;  Location: [facility];  Service: Urology;  Laterality: Right;  90 MINUTES   HERNIA REPAIR Right 04/01/2012   I & D EXTREMITY Left 12/13/2012   Procedure: IRRIGATION AND DEBRIDEMENT LEFT THUMB;  Surgeon: Franky SAUNDERS Curia, MD;  Location: [facility] OR;  Service: Orthopedics;  Laterality: Left;   INCISION AND DRAINAGE OF WOUND     boil on back/notes 07/14/2008 (07/29/2012)   INGUINAL HERNIA REPAIR  04/01/2012   Procedure: HERNIA REPAIR INGUINAL ADULT;  Surgeon: Sherlean JINNY Laughter, MD;  Location: [facility] OR;  Service: General;  Laterality: Right;   INGUINAL HERNIA REPAIR Left 09/06/2016   Procedure: OPEN REPAIR LEFT INGUINAL HERNIA;  Surgeon: Tanda Locus, MD;  Location[facility] OR;  Service: General;  Laterality: Left;   INSERTION OF MESH Left 09/06/2016   Procedure: INSERTION OF MESH;  Surgeon: Tanda Locus, MD;  Location: [facility] OR;  Service: General;  Laterality: Left;   TONSILLECTOMY       Medications:   Current Facility-Administered Medications:    acetaminophen  (TYLENOL ) tablet 650 mg, 650 mg, Oral, Q4H PRN, Elnor Savant A, DO   ondansetron  (ZOFRAN ) tablet 4 mg,  4 mg, Oral, Q8H PRN, Elnor Jayson LABOR, DO  Current Outpatient Medications:    Accu-Chek Softclix Lancets lancets, Use to check blood sugar three times daily as directed, Disp: 100 each, Rfl: 1   Blood Glucose Monitoring Suppl (ACCU-CHEK GUIDE ME) w/Device KIT, Use to test blood sugar 3 times daily., Disp: 1 kit, Rfl: 0   DULoxetine  (CYMBALTA ) 30 MG capsule, Take 3 capsules (90 mg total) by mouth daily. (Patient not taking: Reported on 01/16/2024), Disp: 90 capsule, Rfl: 0   gabapentin  (NEURONTIN ) 300 MG capsule, Take 1 capsule (300 mg total) by mouth 3 (three) times daily. (Patient not taking: Reported on 01/16/2024), Disp: 90 capsule, Rfl: 0   glucose blood (ACCU-CHEK GUIDE TEST) test strip, Use as instructed to check blood sugar three times daily, Disp: 100 each, Rfl: 1   insulin  aspart (NOVOLOG ) 100 UNIT/ML FlexPen, Inject 3 Units into the  skin 3 (three) times daily with meals., Disp: 9 mL, Rfl: 11   insulin  glargine (LANTUS ) 100 UNIT/ML Solostar Pen, Inject 12 Units into the skin at bedtime., Disp: 9 mL, Rfl: 11   Insulin  Pen Needle 32G X 4 MM MISC, Use 1 each to inject insulin  into the skin 4 (four) times daily -  before meals and at bedtime., Disp: 100 each, Rfl: 11   lidocaine  (LIDODERM ) 5 %, Place 1 patch onto the skin daily. Remove & Discard patch within 12 hours or as directed by MD, Disp: 30 patch, Rfl: 0   metFORMIN  (GLUCOPHAGE ) 1000 MG tablet, Take 1 tablet (1,000 mg total) by mouth 2 (two) times daily with a meal., Disp: 60 tablet, Rfl: 0   ondansetron  (ZOFRAN ) 4 MG tablet, Take 1 tablet (4 mg total) by mouth every 6 (six) hours., Disp: 12 tablet, Rfl: 0   pantoprazole  (PROTONIX ) 40 MG tablet, Take 1 tablet (40 mg total) by mouth daily., Disp: 30 tablet, Rfl: 1   traZODone  (DESYREL ) 50 MG tablet, Take 1 tablet (50 mg total) by mouth at bedtime as needed for sleep., Disp: 30 tablet, Rfl: 0  Allergies: Allergies  Allergen Reactions   Lisinopril  Anaphylaxis, Swelling and Other (See Comments)    Lip Swelling and angioedema, too     Jerel JINNY Gravely, NP

## 2024-01-16 NOTE — ED Provider Triage Note (Signed)
 Emergency Medicine Provider Triage Evaluation Note  Mackey Varricchio , a 63 y.o. male  was evaluated in triage.  Pt complains of Seidel.  Patient reports that he is homeless and sleeping on the ground he has been feeling like killing himself for the past 2 days he has no other complaints at this time.  Review of Systems  Positive: Suicidal Negative: Homicidal or audiovisual hallucination  Physical Exam  BP (!) 152/113 (BP Location: Right Arm)   Pulse 90   Temp 98.4 F (36.9 C)   Resp 18   Ht 5' 8 (1.727 m)   Wt 72.6 kg   SpO2 97%   BMI 24.34 kg/m  Gen:   Awake, no distress   Resp:  Normal effort  MSK:   Moves extremities without difficulty  Other:    Medical Decision Making  Medically screening exam initiated at 12:38 PM.  Appropriate orders placed.  [name] was informed that the remainder of the evaluation will be completed by another provider, this initial triage assessment does not replace that evaluation, and the importance of remaining in the ED until their evaluation is complete.     Arloa Chroman, PA-C 01/16/24 1238

## 2024-01-16 NOTE — ED Notes (Signed)
Pt belongings placed in locker number 1 

## 2024-01-16 NOTE — ED Triage Notes (Signed)
 Patient bib [employer] with complaints of suicidal thoughts because he is tired and has no where to live. He also states he has back pain where he previously had surgery.

## 2024-01-16 NOTE — ED Notes (Addendum)
 Assuming pt care, pt bib ems for SI because he has nowhere to live and has chronic back pain. Pt aaox4, ambulates with walker, skin warm/dry. Pt denies SI at this time, laying in bed, sitter assigned to rm

## 2024-01-16 NOTE — ED Notes (Signed)
 Called admitting PA regarding pt BG and orders, per PA orders will be added

## 2024-01-16 NOTE — Discharge Instructions (Addendum)
 Your blood sugars were elevated in the ER but you were not in any crisis from your sugar. Please continue using your insulin  as prescribed. Return to the ER with any new severe symptoms.     RESOURCE GUIDE  Chronic Pain Problems: Contact [facility]  [phone] Patients need to be referred by their primary care doctor.  Insufficient Money for Medicine: Contact [employer]:  call [phone]  No Primary Care Doctor: Call [facility]  [phone] - can help you locate a primary care doctor that  accepts your insurance, provides certain services, etc. Physician Referral Service- [phone]  Agencies that provide inexpensive medical care: [facility]  [phone] [facility]  [phone] [facility]  [phone] [facility]  [phone] [facility]  [phone] [facility]  [phone]  Medicaid-accepting [facility] Providers: [facility]- [address], [address]  [phone], Mon-Fri 9am-7pm, Sat 9am-1pm [facility]- [address] [place], [address] [place]  [phone] [facility]- [address] [place]  [phone] [facility]- [address]  [phone] [facility] Leech- [address] [place], [address], [phone]  Only accepts [place] [facility] [place] patients after they have their name  applied to their card  Self Pay (no insurance) in [facility]: Sickle Cell Patients - [facility]  [address] [place], [phone] [facility]- [address] [place]  [phone]       [place] [facility]- [address]       -     [facility]- see information above (Speak to [employer] if you do not have insurance)       -  [facility]- [address],  [phone]       -  [facility]- [address] [phone]       -  [facility]-  [address], [address], [place], [phone]       -  [facility] - [address] [phone]       -  [facility]- [address], [phone], also [address] [phone]       -     [facility]- [address] [place], [phone], 1st & 3rd Saturday         every month, 10am-1pm  -     [facility]   [address] [place], [place].   Phone:  [phone], Fax:  [phone]. Hours of Operation:  9 am - 6 pm, M-F.  -     [facility]   [address], [place]   Phone: [phone], Fax: [phone]. Hours of Operation:  8:30 am - 5:30 pm, M-F.    Dental Assistance If unable to pay or uninsured, contact:  [facility]. to become qualified for the adult dental clinic.  Patients with Medicaid: [facility] [phone] [address], [phone] [phone]  If unable to pay, or uninsured, contact [facility] [phone] in [place], [phone] in [facility]) to become qualified for the adult dental clinic  [facility] [address] [place], [place] [address] [phone] [email or web address]  Other It Consultant: [facility]- [address] [place], [place], [address], [phone], Ext. [phone], 2nd and 4th Thursday of the month at 6:30am.  10 clients each day by appointment, can sometimes see walk-in patients if someone does not show for an appointment. [facility]- [address] [place], [place], [address], [phone] [facility] [address] [place], [place], [address], [phone] [facility] Health Department- [phone] [facility] Health Department- [phone] [facility] [phone]

## 2024-01-17 ENCOUNTER — Other Ambulatory Visit: Payer: Self-pay

## 2024-01-17 ENCOUNTER — Encounter (HOSPITAL_COMMUNITY): Payer: Self-pay | Admitting: Emergency Medicine

## 2024-01-17 ENCOUNTER — Encounter (HOSPITAL_COMMUNITY): Payer: Self-pay

## 2024-01-17 ENCOUNTER — Emergency Department (HOSPITAL_COMMUNITY)
Admission: EM | Admit: 2024-01-17 | Discharge: 2024-01-17 | Disposition: A | Discharge status: Home / Self Care | Payer: MEDICAID | Source: Home / Self Care | Attending: Emergency Medicine | Admitting: Emergency Medicine

## 2024-01-17 DIAGNOSIS — M79672 Pain in left foot: Secondary | ICD-10-CM | POA: Insufficient documentation

## 2024-01-17 DIAGNOSIS — G8929 Other chronic pain: Secondary | ICD-10-CM

## 2024-01-17 DIAGNOSIS — R45851 Suicidal ideations: Secondary | ICD-10-CM | POA: Insufficient documentation

## 2024-01-17 DIAGNOSIS — M79671 Pain in right foot: Secondary | ICD-10-CM | POA: Insufficient documentation

## 2024-01-17 DIAGNOSIS — R739 Hyperglycemia, unspecified: Secondary | ICD-10-CM | POA: Insufficient documentation

## 2024-01-17 DIAGNOSIS — R7309 Other abnormal glucose: Secondary | ICD-10-CM | POA: Insufficient documentation

## 2024-01-17 LAB — URINALYSIS, ROUTINE W REFLEX MICROSCOPIC
Bacteria, UA: NONE SEEN
Bilirubin Urine: NEGATIVE
Glucose, UA: >=500 mg/dL — AB
Hgb urine dipstick: NEGATIVE
Ketones, ur: NEGATIVE mg/dL
Leukocytes,Ua: NEGATIVE
Nitrite: NEGATIVE
Protein, ur: NEGATIVE mg/dL
Specific Gravity, Urine: 1.027 (ref 1.005–1.030)
pH: 6 (ref 5.0–8.0)

## 2024-01-17 LAB — CBC
HCT: 42.3 % (ref 39.0–52.0)
Hemoglobin: 13.9 g/dL (ref 13.0–17.0)
MCH: 31 pg (ref 26.0–34.0)
MCHC: 32.9 g/dL (ref 30.0–36.0)
MCV: 94.2 fL (ref 80.0–100.0)
Platelets: 388 K/uL (ref 150–400)
RBC: 4.49 MIL/uL (ref 4.22–5.81)
RDW: 12.7 % (ref 11.5–15.5)
WBC: 6.3 K/uL (ref 4.0–10.5)
nRBC: 0 % (ref 0.0–0.2)

## 2024-01-17 LAB — BASIC METABOLIC PANEL WITH GFR
Anion gap: 13 (ref 5–15)
BUN: 17 mg/dL (ref 8–23)
CO2: 23 mmol/L (ref 22–32)
Calcium: 9.2 mg/dL (ref 8.9–10.3)
Chloride: 99 mmol/L (ref 98–111)
Creatinine, Ser: 1.4 mg/dL — ABNORMAL HIGH (ref 0.61–1.24)
GFR, Estimated: 56 mL/min — ABNORMAL LOW (ref >60)
Glucose, Bld: 511 mg/dL (ref 70–99)
Potassium: 4.5 mmol/L (ref 3.5–5.1)
Sodium: 135 mmol/L (ref 135–145)

## 2024-01-17 LAB — CBG MONITORING, ED
Glucose-Capillary: 118 mg/dL — ABNORMAL HIGH (ref 70–99)
Glucose-Capillary: 124 mg/dL — ABNORMAL HIGH (ref 70–99)
Glucose-Capillary: 124 mg/dL — ABNORMAL HIGH (ref 70–99)
Glucose-Capillary: 160 mg/dL — ABNORMAL HIGH (ref 70–99)
Glucose-Capillary: 355 mg/dL — ABNORMAL HIGH (ref 70–99)
Glucose-Capillary: 398 mg/dL — ABNORMAL HIGH (ref 70–99)
Glucose-Capillary: 515 mg/dL (ref 70–99)
Glucose-Capillary: 62 mg/dL — ABNORMAL LOW (ref 70–99)

## 2024-01-17 MED ORDER — SODIUM CHLORIDE 0.9 % IV BOLUS
1000.0000 mL | Freq: Once | INTRAVENOUS | Status: AC
  Administered 2024-01-17: 1000 mL via INTRAVENOUS

## 2024-01-17 MED ORDER — INSULIN ASPART 100 UNIT/ML IJ SOLN
6.0000 [IU] | Freq: Once | INTRAMUSCULAR | Status: AC
  Administered 2024-01-17: 6 [IU] via SUBCUTANEOUS
  Filled 2024-01-17: qty 6

## 2024-01-17 MED ORDER — METFORMIN HCL 500 MG PO TABS
1000.0000 mg | ORAL_TABLET | Freq: Once | ORAL | Status: AC
  Administered 2024-01-17: 1000 mg via ORAL
  Filled 2024-01-17: qty 2

## 2024-01-17 MED ORDER — GABAPENTIN 300 MG PO CAPS
300.0000 mg | ORAL_CAPSULE | ORAL | Status: AC
  Administered 2024-01-17: 300 mg via ORAL
  Filled 2024-01-17: qty 1

## 2024-01-17 NOTE — Discharge Instructions (Signed)
 Return for any problem.  ?

## 2024-01-17 NOTE — ED Provider Notes (Signed)
 I assumed care of this patient at time of shift change from Dr. Franklyn; patient was initially here for psychiatric concerns, was diagnosed with malingering well-known to psychiatric service and is medically cleared around 4 PM.  For unknown reasons the patient was boarded in the emergency department purple pod and subsequently found to be hyperglycemic.  As preceding EDP was occupied earlier in the evening, I was contacted for assistance regarding management of his hyperglycemia and subcu insulin  was ordered.  Labs did not reveal any evidence of hyperglycemic crisis.  A subsequent dose of subcu insulin  was ordered by Dr. Franklyn, and unfortunately patient became hypoglycemic.  Was given p.o. with improvement in his CBG.  CBG was monitored for approximately 4 hours without recurrent hypoglycemia.  As patient is without evidence of hyperglycemic crisis, is hemodynamically stable, and has been psychiatrically cleared, there is no indication for further ED workup or inpatient management at this time.  Patient has outpatient psychiatric resources in his disposition papers.  Clinical concern for emergent underlying condition that would warrant further ED workup and patient management is exceedingly low.  This chart was dictated using voice recognition software, Dragon. Despite the best efforts of this provider to proofread and correct errors, errors may still occur which can change documentation meaning.    Bobette Pleasant SAUNDERS, PA-C 01/17/24 0443    Griselda Norris, MD 01/17/24 [phone]

## 2024-01-17 NOTE — ED Provider Triage Note (Signed)
 Emergency Medicine Provider Triage Evaluation Note  David Wyatt , a 63 y.o. male  was evaluated in triage.  Pt complains of hyperglycemia. Was just here, discharged this morning. States he ate graham crackers and diet soda and thinks his sugar might be high again. Has not filled or taken any of his medications.  Review of Systems  Positive:  Negative:   Physical Exam  BP (!) 153/97   Pulse 86   Temp 98.7 F (37.1 C) (Oral)   Resp 20   SpO2 99%  Gen:   Awake, no distress   Resp:  Normal effort  MSK:   Moves extremities without difficulty  Other:    Medical Decision Making  Medically screening exam initiated at 11:36 AM.  Appropriate orders placed.  [name] was informed that the remainder of the evaluation will be completed by another provider, this initial triage assessment does not replace that evaluation, and the importance of remaining in the ED until their evaluation is complete.     Nora Lauraine LABOR, PA-C 01/17/24 1138

## 2024-01-17 NOTE — ED Notes (Signed)
 Per PA Sarah, dont need to change pt out.

## 2024-01-17 NOTE — ED Notes (Signed)
 Pt verbally aggressive toward Messick MD upon being discharged, stating fuck you motherfucker. Pt being escorted out by security.

## 2024-01-17 NOTE — ED Notes (Signed)
 Pt requested to speak with social worker after discharge. ED social worker messaged via secure chat and informed pt would be in the waiting room.

## 2024-01-17 NOTE — ED Provider Notes (Addendum)
 [place] EMERGENCY DEPARTMENT AT [facility] Provider Note   CSN: [id number] Arrival date & time: 01/17/24  1124     Patient presents with: Hyperglycemia and Suicidal   Sorren Vallier is a 63 y.o. male.   63 year old male with prior medical history as detailed below presents for evaluation.  Patient with multiple ED evaluations just in the last 48 hours.  Patient without specific complaint now.  He has been seen multiple times for same complaint.  It appears that after his most recent discharge he had graham crackers and soda and then asked for sugar check.  Of course, his sugar was elevated.  He therefore requested reevaluation.  Patient without acute complaint.  Notably the patient is not suicidal.  He denies chest pain, shortness of breath, nausea, vomiting, other emergent complaint.  Patient with well-established history of malingering behavior.   The history is provided by the patient and medical records.       Prior to Admission medications   Medication Sig Start Date End Date Taking? Authorizing Provider  Accu-Chek Softclix Lancets lancets Use to check blood sugar three times daily as directed 11/01/23   Shrivastava, Aryendra, MD  Blood Glucose Monitoring Suppl (ACCU-CHEK GUIDE ME) w/Device KIT Use to test blood sugar 3 times daily. 11/01/23   Shrivastava, Aryendra, MD  DULoxetine  (CYMBALTA ) 30 MG capsule Take 3 capsules (90 mg total) by mouth daily. 12/13/23   Donnelly Mellow, MD  gabapentin  (NEURONTIN ) 300 MG capsule Take 1 capsule (300 mg total) by mouth 3 (three) times daily. 12/12/23   Donnelly Mellow, MD  glucose blood (ACCU-CHEK GUIDE TEST) test strip Use as instructed to check blood sugar three times daily 11/01/23   Shrivastava, Aryendra, MD  insulin  aspart (NOVOLOG ) 100 UNIT/ML FlexPen Inject 3 Units into the skin 3 (three) times daily with meals. 12/27/23   Zelaya, Oscar A, PA-C  insulin  glargine (LANTUS ) 100 UNIT/ML Solostar Pen Inject 12 Units into the  skin at bedtime. 12/27/23   Zelaya, Oscar A, PA-C  Insulin  Pen Needle 32G X 4 MM MISC Use 1 each to inject insulin  into the skin 4 (four) times daily -  before meals and at bedtime. 12/28/23   Zelaya, Oscar A, PA-C  lidocaine  (LIDODERM ) 5 % Place 1 patch onto the skin daily. Remove & Discard patch within 12 hours or as directed by MD 01/15/24   Kingsley, Victoria K, DO  metFORMIN  (GLUCOPHAGE ) 1000 MG tablet Take 1 tablet (1,000 mg total) by mouth 2 (two) times daily with a meal. 12/12/23   Donnelly Mellow, MD  ondansetron  (ZOFRAN ) 4 MG tablet Take 1 tablet (4 mg total) by mouth every 6 (six) hours. 12/16/23   Robinson, John K, PA-C  pantoprazole  (PROTONIX ) 40 MG tablet Take 1 tablet (40 mg total) by mouth daily. 11/30/23   Rosario Leatrice FERNS, MD  traZODone  (DESYREL ) 50 MG tablet Take 1 tablet (50 mg total) by mouth at bedtime as needed for sleep. 12/12/23   Jadapalle, Sree, MD    Allergies: Lisinopril     Review of Systems  All other systems reviewed and are negative.   Updated Vital Signs BP (!) 153/97   Pulse 86   Temp 98.7 F (37.1 C) (Oral)   Resp 20   SpO2 99%   Physical Exam Vitals and nursing note reviewed.  Constitutional:      General: He is not in acute distress.    Appearance: Normal appearance. He is well-developed.  HENT:     Head: Normocephalic and  atraumatic.  Eyes:     Conjunctiva/sclera: Conjunctivae normal.  Cardiovascular:     Rate and Rhythm: Normal rate and regular rhythm.     Heart sounds: No murmur heard. Pulmonary:     Effort: Pulmonary effort is normal. No respiratory distress.     Breath sounds: Normal breath sounds.  Abdominal:     Palpations: Abdomen is soft.     Tenderness: There is no abdominal tenderness.  Musculoskeletal:        General: No swelling.     Cervical back: Neck supple.  Skin:    General: Skin is warm and dry.     Capillary Refill: Capillary refill takes less than 2 seconds.  Neurological:     Mental Status: He is alert.   Psychiatric:        Mood and Affect: Mood normal.     (all labs ordered are listed, but only abnormal results are displayed) Labs Reviewed  URINALYSIS, ROUTINE W REFLEX MICROSCOPIC - Abnormal; Notable for the following components:      Result Value   Color, Urine STRAW (*)    Glucose, UA >=500 (*)    All other components within normal limits  CBG MONITORING, ED - Abnormal; Notable for the following components:   Glucose-Capillary 515 (*)    All other components within normal limits  CBC  BASIC METABOLIC PANEL WITH GFR    EKG: None  Radiology: No results found.   Procedures   Medications Ordered in the ED  sodium chloride  0.9 % bolus 1,000 mL (has no administration in time range)                                    Medical Decision Making Patient with well-documented malingering behavior.  Patient has not complied with prior prescriptions and filled them and started taking them.  He has been seen multiple times over the last 24 to 48 hours for similar complaints.  It appears that this most recent evaluation began after the patient consumed soda in the waiting room and then asked for repeat glucose check.  Given that it was elevated he then asked for reevaluation.  His sugar was elevated secondary to recent consumption of soda.  With IV fluids his hyperglycemia is improved.  Patient is advised to avoid high sugar foods.  He is also advised to take his medicines as prescribed.  Patient without indication for additional ED workup or evaluation.  Patient is appropriate for discharge.  Patient became agitated and belligerent at time of discharge.  Security was required to escort him out of the ED.        Final diagnoses:  Hyperglycemia    ED Discharge Orders     None          Laurice Maude BROCKS, MD 01/17/24 1440    Laurice Maude BROCKS, MD 01/17/24 1501

## 2024-01-17 NOTE — ED Triage Notes (Addendum)
 Pt c/o bilateral neuropathy pain in feet and hands. Pt states this has been ongoing. Pt also has back pain. Pt states he also feels like his blood sugar is elevated.

## 2024-01-17 NOTE — ED Triage Notes (Signed)
 Pt here stating his blood sugar is high. Just discharged an hour ago. Pt reports eating graham crackers and drinking diet coke. During triage pt states he is suicidal because he just can't take it any more.

## 2024-01-17 NOTE — ED Notes (Signed)
 After checking pt's blood sugar, pt ask if he could talk to me abt suicidal.  I then asked the pt if he is suicidal and he said no.   I asked the pt again abt being suicidal and pt stated no again.  Pt stated that he is homeless and have tried to talk to 3 ppl about this.  Pt stated that it is getting to him and again he tried talking to some one.

## 2024-01-18 ENCOUNTER — Other Ambulatory Visit: Payer: Self-pay

## 2024-01-18 ENCOUNTER — Emergency Department (HOSPITAL_COMMUNITY)
Admission: EM | Admit: 2024-01-18 | Discharge: 2024-01-18 | Disposition: A | Discharge status: Home / Self Care | Payer: MEDICAID | Attending: Emergency Medicine | Admitting: Emergency Medicine

## 2024-01-18 ENCOUNTER — Ambulatory Visit (HOSPITAL_COMMUNITY)
Admission: EM | Admit: 2024-01-18 | Discharge: 2024-01-18 | Disposition: A | Discharge status: Home / Self Care | Payer: MEDICAID | Source: Intra-hospital | Attending: Psychiatry | Admitting: Psychiatry

## 2024-01-18 DIAGNOSIS — Z5900 Homelessness unspecified: Secondary | ICD-10-CM | POA: Insufficient documentation

## 2024-01-18 DIAGNOSIS — Z7984 Long term (current) use of oral hypoglycemic drugs: Secondary | ICD-10-CM | POA: Insufficient documentation

## 2024-01-18 DIAGNOSIS — Z5329 Procedure and treatment not carried out because of patient's decision for other reasons: Secondary | ICD-10-CM | POA: Insufficient documentation

## 2024-01-18 DIAGNOSIS — Z794 Long term (current) use of insulin: Secondary | ICD-10-CM | POA: Insufficient documentation

## 2024-01-18 DIAGNOSIS — Z765 Malingerer [conscious simulation]: Secondary | ICD-10-CM

## 2024-01-18 DIAGNOSIS — R739 Hyperglycemia, unspecified: Secondary | ICD-10-CM

## 2024-01-18 DIAGNOSIS — F1410 Cocaine abuse, uncomplicated: Secondary | ICD-10-CM | POA: Diagnosis not present

## 2024-01-18 DIAGNOSIS — E1165 Type 2 diabetes mellitus with hyperglycemia: Secondary | ICD-10-CM | POA: Insufficient documentation

## 2024-01-18 LAB — CBC WITH DIFFERENTIAL/PLATELET
Abs Immature Granulocytes: 0.01 K/uL (ref 0.00–0.07)
Basophils Absolute: 0.1 K/uL (ref 0.0–0.1)
Basophils Relative: 1 %
Eosinophils Absolute: 0.2 K/uL (ref 0.0–0.5)
Eosinophils Relative: 4 %
HCT: 40.2 % (ref 39.0–52.0)
Hemoglobin: 12.9 g/dL — ABNORMAL LOW (ref 13.0–17.0)
Immature Granulocytes: 0 %
Lymphocytes Relative: 32 %
Lymphs Abs: 1.9 K/uL (ref 0.7–4.0)
MCH: 30.4 pg (ref 26.0–34.0)
MCHC: 32.1 g/dL (ref 30.0–36.0)
MCV: 94.8 fL (ref 80.0–100.0)
Monocytes Absolute: 0.5 K/uL (ref 0.1–1.0)
Monocytes Relative: 8 %
Neutro Abs: 3.4 K/uL (ref 1.7–7.7)
Neutrophils Relative %: 55 %
Platelets: 376 K/uL (ref 150–400)
RBC: 4.24 MIL/uL (ref 4.22–5.81)
RDW: 12.9 % (ref 11.5–15.5)
WBC: 6.1 K/uL (ref 4.0–10.5)
nRBC: 0 % (ref 0.0–0.2)

## 2024-01-18 LAB — BASIC METABOLIC PANEL WITH GFR
Anion gap: 8 (ref 5–15)
BUN: 24 mg/dL — ABNORMAL HIGH (ref 8–23)
CO2: 25 mmol/L (ref 22–32)
Calcium: 9.2 mg/dL (ref 8.9–10.3)
Chloride: 102 mmol/L (ref 98–111)
Creatinine, Ser: 1.29 mg/dL — ABNORMAL HIGH (ref 0.61–1.24)
GFR, Estimated: >60 mL/min (ref >60)
Glucose, Bld: 449 mg/dL — ABNORMAL HIGH (ref 70–99)
Potassium: 4.5 mmol/L (ref 3.5–5.1)
Sodium: 135 mmol/L (ref 135–145)

## 2024-01-18 LAB — CBG MONITORING, ED: Glucose-Capillary: 418 mg/dL — ABNORMAL HIGH (ref 70–99)

## 2024-01-18 LAB — GLUCOSE, CAPILLARY: Glucose-Capillary: 406 mg/dL — ABNORMAL HIGH (ref 70–99)

## 2024-01-18 MED ORDER — INSULIN GLARGINE-YFGN 100 UNIT/ML ~~LOC~~ SOLN
8.0000 [IU] | Freq: Once | SUBCUTANEOUS | Status: AC
  Administered 2024-01-18: 8 [IU] via SUBCUTANEOUS
  Filled 2024-01-18: qty 0.08

## 2024-01-18 MED ORDER — INSULIN ASPART 100 UNIT/ML IJ SOLN
3.0000 [IU] | Freq: Once | INTRAMUSCULAR | Status: DC
Start: 2024-01-18 — End: 2024-01-18
  Filled 2024-01-18: qty 0.03

## 2024-01-18 NOTE — ED Triage Notes (Signed)
 Pt sent here from [facility]- they would not accept him for his request of detox from crack (last use was last night). Pt asked to have his sugar checked and found it to be 496. Pt has c/o excessive thirst and polyuria. 140/86 bp 70 hr 96% r/a

## 2024-01-18 NOTE — Discharge Instructions (Addendum)
 Discharge recommendations:   Medications: Patient is to take medications as prescribed. The patient or patient's guardian is to contact a medical professional and/or outpatient provider to address any new side effects that develop. The patient or the patient's guardian should update outpatient providers of any new medications and/or medication changes.    Outpatient Follow up: Please review list of outpatient resources for psychiatry and counseling. Please follow up with your primary care provider for all medical related needs. Please schedule an appointment with your PCP to evaluate your type diabetes diagnosis and medication management.    Therapy: We recommend that patient participate in individual therapy to address mental health concerns.   Atypical antipsychotics: If you are prescribed an atypical antipsychotic, it is recommended that your height, weight, BMI, blood pressure, fasting lipid panel, and fasting blood sugar be monitored by your outpatient providers.  Safety:   The following safety precautions should be taken:   No sharp objects. This includes scissors, razors, scrapers, and putty knives.   Chemicals should be removed and locked up.   Medications should be removed and locked up.   Weapons should be removed and locked up. This includes firearms, knives and instruments that can be used to cause injury.   The patient should abstain from use of illicit substances/drugs and abuse of any medications.  If symptoms worsen or do not continue to improve or if the patient becomes actively suicidal or homicidal then it is recommended that the patient return to the closest hospital emergency department, the [facility], or call 911 for further evaluation and treatment. National Suicide Prevention Lifeline 1-800-SUICIDE or 415-511-1738.  About 988 988 offers 24/7 access to trained crisis counselors who can help people experiencing mental  health-related distress. People can call or text 988 or chat 988lifeline.org for themselves or if they are worried about a loved one who may need crisis support.    You are encouraged to follow up with [facility] for outpatient treatment.  Walk in/ Open Access Hours: Monday - Friday 8AM - 11AM (To see provider and therapist)  [facility] [address] [place], [place] [phone]

## 2024-01-18 NOTE — ED Notes (Signed)
 Patient called 911 from the waiting room due to not being admitted, patient left facility in Ambulance

## 2024-01-18 NOTE — ED Provider Notes (Signed)
 [place] EMERGENCY DEPARTMENT AT [facility] Provider Note   CSN: [id number] Arrival date & time: 01/17/24  9453     Patient presents with: Foot Pain   David Wyatt is a 63 y.o. male.    Foot Pain   Patient here with bilateral foot pain states that it feels similar to have has felt for years he has a history of neuropathy he was seen yesterday and had lab work done today his blood sugars 398 on CBG.  He states he otherwise feels well.       Prior to Admission medications   Medication Sig Start Date End Date Taking? Authorizing Provider  Accu-Chek Softclix Lancets lancets Use to check blood sugar three times daily as directed 11/01/23   Shrivastava, Aryendra, MD  Blood Glucose Monitoring Suppl (ACCU-CHEK GUIDE ME) w/Device KIT Use to test blood sugar 3 times daily. 11/01/23   Shrivastava, Aryendra, MD  DULoxetine  (CYMBALTA ) 30 MG capsule Take 3 capsules (90 mg total) by mouth daily. 12/13/23   Jadapalle, Sree, MD  gabapentin  (NEURONTIN ) 300 MG capsule Take 1 capsule (300 mg total) by mouth 3 (three) times daily. 12/12/23   Donnelly Mellow, MD  glucose blood (ACCU-CHEK GUIDE TEST) test strip Use as instructed to check blood sugar three times daily 11/01/23   Shrivastava, Aryendra, MD  insulin  aspart (NOVOLOG ) 100 UNIT/ML FlexPen Inject 3 Units into the skin 3 (three) times daily with meals. 12/27/23   Zelaya, Oscar A, PA-C  insulin  glargine (LANTUS ) 100 UNIT/ML Solostar Pen Inject 12 Units into the skin at bedtime. 12/27/23   Zelaya, Oscar A, PA-C  Insulin  Pen Needle 32G X 4 MM MISC Use 1 each to inject insulin  into the skin 4 (four) times daily -  before meals and at bedtime. 12/28/23   Zelaya, Oscar A, PA-C  lidocaine  (LIDODERM ) 5 % Place 1 patch onto the skin daily. Remove & Discard patch within 12 hours or as directed by MD 01/15/24   Kingsley, Victoria K, DO  metFORMIN  (GLUCOPHAGE ) 1000 MG tablet Take 1 tablet (1,000 mg total) by mouth 2 (two) times daily with a meal.  12/12/23   Donnelly Mellow, MD  ondansetron  (ZOFRAN ) 4 MG tablet Take 1 tablet (4 mg total) by mouth every 6 (six) hours. 12/16/23   Robinson, John K, PA-C  pantoprazole  (PROTONIX ) 40 MG tablet Take 1 tablet (40 mg total) by mouth daily. 11/30/23   Rosario Leatrice FERNS, MD  traZODone  (DESYREL ) 50 MG tablet Take 1 tablet (50 mg total) by mouth at bedtime as needed for sleep. 12/12/23   Jadapalle, Sree, MD    Allergies: Lisinopril     Review of Systems  Updated Vital Signs BP (!) 142/110 (BP Location: Right Arm)   Pulse (!) 59   Temp 98.7 F (37.1 C) (Oral)   Resp 15   Ht 5' 8 (1.727 m)   Wt 68 kg   SpO2 100%   BMI 22.81 kg/m   Physical Exam Vitals and nursing note reviewed.  Constitutional:      General: He is not in acute distress. HENT:     Head: Normocephalic and atraumatic.     Nose: Nose normal.     Mouth/Throat:     Mouth: Mucous membranes are moist.  Eyes:     General: No scleral icterus. Cardiovascular:     Rate and Rhythm: Normal rate and regular rhythm.     Pulses: Normal pulses.     Heart sounds: Normal heart sounds.  Pulmonary:  Effort: Pulmonary effort is normal. No respiratory distress.     Breath sounds: No wheezing.  Abdominal:     Palpations: Abdomen is soft.     Tenderness: There is no abdominal tenderness. There is no guarding or rebound.  Musculoskeletal:     Cervical back: Normal range of motion.     Right lower leg: No edema.     Left lower leg: No edema.     Comments: Feet are without any wounds or cellulitic changes.  Bilateral calves are normal diameter doubt DVT  Skin:    General: Skin is warm and dry.     Capillary Refill: Capillary refill takes less than 2 seconds.  Neurological:     Mental Status: He is alert. Mental status is at baseline.  Psychiatric:        Mood and Affect: Mood normal.        Behavior: Behavior normal.     (all labs ordered are listed, but only abnormal results are displayed) Labs Reviewed  CBG MONITORING, ED  - Abnormal; Notable for the following components:      Result Value   Glucose-Capillary 398 (*)    All other components within normal limits    EKG: None  Radiology: No results found.   Procedures   Medications Ordered in the ED  insulin  aspart (novoLOG ) injection 6 Units (6 Units Subcutaneous Given 01/17/24 1013)  gabapentin  (NEURONTIN ) capsule 300 mg (300 mg Oral Given 01/17/24 1013)  metFORMIN  (GLUCOPHAGE ) tablet 1,000 mg (1,000 mg Oral Given 01/17/24 1013)                                    Medical Decision Making Risk Prescription drug management.   Patient here with bilateral foot pain states that it feels similar to have has felt for years he has a history of neuropathy he was seen yesterday and had lab work done today his blood sugars 398 on CBG.  He states he otherwise feels well.  CBG 398 will provide patient with a dose of gabapentin , metformin  1000 mg and insulin  6 units  Patient had lab work done yesterday that showed that he was not in DKA.  He seems to be chronically hyperglycemic.   Final diagnoses:  Other chronic pain    ED Discharge Orders     None          Neldon Hamp RAMAN, [place] 01/18/24 1045    Dean Clarity, MD 01/19/24 [phone]

## 2024-01-18 NOTE — ED Provider Notes (Signed)
 [place] EMERGENCY DEPARTMENT AT [facility] Provider Note   CSN: [id number] Arrival date & time: 01/18/24  1841     Patient presents with: Hyperglycemia   David Wyatt is a 63 y.o. male.   63 year old male well-known to the emergency room here today because he is concerned about elevated blood sugar.  Patient was at [facility] earlier today.  Patient with known history of diabetes, noncompliant with his medications, malingering.   Hyperglycemia      Prior to Admission medications   Medication Sig Start Date End Date Taking? Authorizing Provider  Accu-Chek Softclix Lancets lancets Use to check blood sugar three times daily as directed 11/01/23   Shrivastava, Aryendra, MD  Blood Glucose Monitoring Suppl (ACCU-CHEK GUIDE ME) w/Device KIT Use to test blood sugar 3 times daily. 11/01/23   Shrivastava, Aryendra, MD  DULoxetine  (CYMBALTA ) 30 MG capsule Take 3 capsules (90 mg total) by mouth daily. Patient not taking: Reported on 01/18/2024 12/13/23   Jadapalle, Sree, MD  gabapentin  (NEURONTIN ) 300 MG capsule Take 1 capsule (300 mg total) by mouth 3 (three) times daily. 12/12/23   Donnelly Mellow, MD  glucose blood (ACCU-CHEK GUIDE TEST) test strip Use as instructed to check blood sugar three times daily Patient taking differently: 1 each by Other route daily at 6 (six) AM. Use as instructed to check blood sugar three times daily 11/01/23   Shrivastava, Aryendra, MD  insulin  aspart (NOVOLOG ) 100 UNIT/ML FlexPen Inject 3 Units into the skin 3 (three) times daily with meals. 12/27/23   Zelaya, Oscar A, PA-C  insulin  glargine (LANTUS ) 100 UNIT/ML Solostar Pen Inject 12 Units into the skin at bedtime. 12/27/23   Zelaya, Oscar A, PA-C  Insulin  Pen Needle 32G X 4 MM MISC Use 1 each to inject insulin  into the skin 4 (four) times daily -  before meals and at bedtime. 12/28/23   Zelaya, Oscar A, PA-C  lidocaine  (LIDODERM ) 5 % Place 1 patch onto the skin daily. Remove & Discard patch within 12  hours or as directed by MD Patient not taking: Reported on 01/18/2024 01/15/24   Kingsley, Victoria K, DO  metFORMIN  (GLUCOPHAGE ) 1000 MG tablet Take 1 tablet (1,000 mg total) by mouth 2 (two) times daily with a meal. 12/12/23   Donnelly Mellow, MD  ondansetron  (ZOFRAN ) 4 MG tablet Take 1 tablet (4 mg total) by mouth every 6 (six) hours. Patient not taking: Reported on 01/18/2024 12/16/23   Lang Norleen POUR, PA-C  pantoprazole  (PROTONIX ) 40 MG tablet Take 1 tablet (40 mg total) by mouth daily. Patient not taking: Reported on 01/18/2024 11/30/23   Rosario Eland I, MD  traZODone  (DESYREL ) 50 MG tablet Take 1 tablet (50 mg total) by mouth at bedtime as needed for sleep. 12/12/23   Jadapalle, Sree, MD    Allergies: Lisinopril     Review of Systems  Updated Vital Signs BP 136/74   Pulse 75   Temp 98.2 F (36.8 C) (Oral)   Resp 16   SpO2 97%   Physical Exam Vitals and nursing note reviewed.  Constitutional:      Appearance: Normal appearance.  Pulmonary:     Effort: Pulmonary effort is normal.  Abdominal:     General: Abdomen is flat.  Neurological:     Mental Status: He is alert.     (all labs ordered are listed, but only abnormal results are displayed) Labs Reviewed  CBC WITH DIFFERENTIAL/PLATELET - Abnormal; Notable for the following components:      Result Value  Hemoglobin 12.9 (*)    All other components within normal limits  BASIC METABOLIC PANEL WITH GFR - Abnormal; Notable for the following components:   Glucose, Bld 449 (*)    BUN 24 (*)    Creatinine, Ser 1.29 (*)    All other components within normal limits  CBG MONITORING, ED - Abnormal; Notable for the following components:   Glucose-Capillary 418 (*)    All other components within normal limits    EKG: None  Radiology: No results found.   Procedures   Medications Ordered in the ED  insulin  glargine-yfgn (SEMGLEE ) injection 8 Units (has no administration in time range)                                     Medical Decision Making 63 year old male here today for hyperglycemia.  Plan-patient well-known to our ED.  On my evaluation of the patient, he overall looks well.  Patient here yesterday, became very verbally aggressive with staff, had to be escorted out by security.  I did discuss this with the patient, he was apologetic.  He requested something to eat.  And told the patient we will be able to do that once we checked his labs.  I expect the patient to be hyperglycemic.  I informed the patient that once his workup was completed, he would likely be discharged.  Patient did become a bit upset at that.  I believe he is here malingering.    Reassessment 7:54 PM-patient is not acidotic.  Gave him 8 units of his nighttime insulin .  He was given a meal tray.  Discharged.  This patient's health care is complicated by the following social determinants of health-lack of access to primary care, housing insecurity.  Amount and/or Complexity of Data Reviewed Labs: ordered.  Risk Prescription drug management.        Final diagnoses:  Hyperglycemia    ED Discharge Orders     None          Mannie Fairy DASEN, DO 01/18/24 1955

## 2024-01-18 NOTE — Progress Notes (Signed)
   01/18/24 1502  [facility] Triage Screening (Walk-ins at [facility] only)  How Did You Hear About Us ? Self  What Is the Reason for Your Visit/Call Today? Patient presents voluntary by [facility] to [facility] requesting assistance with ongoing substance abuse issues and housing. Patient reports passive SI although denies any plan or intent. Patient denies any HI or AVH.  Patient reports daily crack cocaine use although is vague in reference to time frame, last use and amounts used. Patient was last seen on 11/7 when he presented to [facility] with similar symptoms and did not meet the criteria for an inpatient admission. Patient has per chart review a history of malingering in the context of homelessness and having no shelter or resources.  How Long Has This Been Causing You Problems? > than 6 months  Have You Recently Had Any Thoughts About Hurting Yourself? Yes  How long ago did you have thoughts about hurting yourself? Patient denies any active SI or intent.  Are You Planning to Commit Suicide/Harm Yourself At This time? No  Have you Recently Had Thoughts About Hurting Someone [name]? No  Are You Planning To Harm Someone At This Time? No  Explanation: NA  Physical Abuse Denies  Verbal Abuse Denies  Sexual Abuse Denies  Exploitation of patient/patient's resources Denies  Self-Neglect Denies  Possible abuse reported to: Other (Comment) (NA)  Are you currently experiencing any auditory, visual or other hallucinations? No  Please explain the hallucinations you are currently experiencing: NA  Have You Used Any Alcohol or Drugs in the Past 24 Hours? Yes  What Did You Use and How Much? Pt reports crack cocaine use although is vague on time frame and amounts used.  Do you have any current medical co-morbidities that require immediate attention? No  Please describe current medical co-morbidities that require immediate attention: NA  Clinician description of patient physical appearance/behavior: Patient is observed to be  irratible  What Do You Feel Would Help You the Most Today? Alcohol or Drug Use Treatment  If access to [facility] was not available, would you have sought care in the Emergency Department? No  Determination of Need Routine (7 days)  Options For Referral Other: Comment (To be determined)

## 2024-01-19 ENCOUNTER — Emergency Department (HOSPITAL_COMMUNITY)
Admission: EM | Admit: 2024-01-19 | Discharge: 2024-01-19 | Disposition: A | Discharge status: Home / Self Care | Payer: MEDICAID | Attending: Emergency Medicine | Admitting: Emergency Medicine

## 2024-01-19 ENCOUNTER — Other Ambulatory Visit: Payer: Self-pay

## 2024-01-19 ENCOUNTER — Telehealth: Payer: Self-pay

## 2024-01-19 ENCOUNTER — Emergency Department (HOSPITAL_COMMUNITY)
Admission: EM | Admit: 2024-01-19 | Discharge: 2024-01-19 | Disposition: A | Discharge status: Home / Self Care | Payer: MEDICAID | Source: Home / Self Care | Attending: Emergency Medicine | Admitting: Emergency Medicine

## 2024-01-19 ENCOUNTER — Encounter: Payer: Self-pay | Admitting: Family Medicine

## 2024-01-19 ENCOUNTER — Other Ambulatory Visit (HOSPITAL_COMMUNITY): Payer: Self-pay

## 2024-01-19 ENCOUNTER — Encounter (HOSPITAL_COMMUNITY): Payer: Self-pay

## 2024-01-19 ENCOUNTER — Ambulatory Visit: Payer: MEDICAID | Attending: Family Medicine | Admitting: Family Medicine

## 2024-01-19 ENCOUNTER — Ambulatory Visit: Payer: MEDICAID | Admitting: Family Medicine

## 2024-01-19 VITALS — BP 133/82 | HR 73 | Temp 98.4°F | Ht 68.0 in | Wt 160.6 lb

## 2024-01-19 DIAGNOSIS — E1142 Type 2 diabetes mellitus with diabetic polyneuropathy: Secondary | ICD-10-CM

## 2024-01-19 DIAGNOSIS — F332 Major depressive disorder, recurrent severe without psychotic features: Secondary | ICD-10-CM

## 2024-01-19 DIAGNOSIS — E1169 Type 2 diabetes mellitus with other specified complication: Secondary | ICD-10-CM

## 2024-01-19 DIAGNOSIS — E1165 Type 2 diabetes mellitus with hyperglycemia: Secondary | ICD-10-CM | POA: Insufficient documentation

## 2024-01-19 DIAGNOSIS — Z794 Long term (current) use of insulin: Secondary | ICD-10-CM

## 2024-01-19 DIAGNOSIS — F32A Depression, unspecified: Secondary | ICD-10-CM | POA: Diagnosis not present

## 2024-01-19 DIAGNOSIS — F1490 Cocaine use, unspecified, uncomplicated: Secondary | ICD-10-CM

## 2024-01-19 DIAGNOSIS — E1140 Type 2 diabetes mellitus with diabetic neuropathy, unspecified: Secondary | ICD-10-CM | POA: Diagnosis not present

## 2024-01-19 DIAGNOSIS — I739 Peripheral vascular disease, unspecified: Secondary | ICD-10-CM

## 2024-01-19 DIAGNOSIS — Z7984 Long term (current) use of oral hypoglycemic drugs: Secondary | ICD-10-CM | POA: Diagnosis not present

## 2024-01-19 DIAGNOSIS — I10 Essential (primary) hypertension: Secondary | ICD-10-CM | POA: Insufficient documentation

## 2024-01-19 DIAGNOSIS — R739 Hyperglycemia, unspecified: Secondary | ICD-10-CM

## 2024-01-19 DIAGNOSIS — Z5901 Sheltered homelessness: Secondary | ICD-10-CM | POA: Diagnosis not present

## 2024-01-19 DIAGNOSIS — Z1211 Encounter for screening for malignant neoplasm of colon: Secondary | ICD-10-CM

## 2024-01-19 DIAGNOSIS — E1149 Type 2 diabetes mellitus with other diabetic neurological complication: Secondary | ICD-10-CM

## 2024-01-19 LAB — URINALYSIS, W/ REFLEX TO CULTURE (INFECTION SUSPECTED)
Bacteria, UA: NONE SEEN
Bilirubin Urine: NEGATIVE
Glucose, UA: >=500 mg/dL — AB
Hgb urine dipstick: NEGATIVE
Ketones, ur: NEGATIVE mg/dL
Leukocytes,Ua: NEGATIVE
Nitrite: NEGATIVE
Protein, ur: NEGATIVE mg/dL
Specific Gravity, Urine: 1.021 (ref 1.005–1.030)
pH: 6 (ref 5.0–8.0)

## 2024-01-19 LAB — I-STAT CHEM 8, ED
BUN: 28 mg/dL — ABNORMAL HIGH (ref 8–23)
BUN: 21 mg/dL (ref 8–23)
Calcium, Ion: 1.2 mmol/L (ref 1.15–1.40)
Calcium, Ion: 1.18 mmol/L (ref 1.15–1.40)
Chloride: 106 mmol/L (ref 98–111)
Chloride: 98 mmol/L (ref 98–111)
Creatinine, Ser: 1.3 mg/dL — ABNORMAL HIGH (ref 0.61–1.24)
Creatinine, Ser: 1.2 mg/dL (ref 0.61–1.24)
Glucose, Bld: 164 mg/dL — ABNORMAL HIGH (ref 70–99)
Glucose, Bld: >700 mg/dL (ref 70–99)
HCT: 44 % (ref 39.0–52.0)
HCT: 41 % (ref 39.0–52.0)
Hemoglobin: 15 g/dL (ref 13.0–17.0)
Hemoglobin: 13.9 g/dL (ref 13.0–17.0)
Potassium: 4.3 mmol/L (ref 3.5–5.1)
Potassium: 4.8 mmol/L (ref 3.5–5.1)
Sodium: 143 mmol/L (ref 135–145)
Sodium: 130 mmol/L — ABNORMAL LOW (ref 135–145)
TCO2: 26 mmol/L (ref 22–32)
TCO2: 23 mmol/L (ref 22–32)

## 2024-01-19 LAB — I-STAT VENOUS BLOOD GAS, ED
Acid-Base Excess: 1 mmol/L (ref 0.0–2.0)
Acid-base deficit: 3 mmol/L — ABNORMAL HIGH (ref 0.0–2.0)
Bicarbonate: 25.7 mmol/L (ref 20.0–28.0)
Bicarbonate: 21.2 mmol/L (ref 20.0–28.0)
Calcium, Ion: 1.2 mmol/L (ref 1.15–1.40)
Calcium, Ion: 1.17 mmol/L (ref 1.15–1.40)
HCT: 43 % (ref 39.0–52.0)
HCT: 41 % (ref 39.0–52.0)
Hemoglobin: 14.6 g/dL (ref 13.0–17.0)
Hemoglobin: 13.9 g/dL (ref 13.0–17.0)
O2 Saturation: 83 %
O2 Saturation: 99 %
Potassium: 4.3 mmol/L (ref 3.5–5.1)
Potassium: 4.8 mmol/L (ref 3.5–5.1)
Sodium: 142 mmol/L (ref 135–145)
Sodium: 130 mmol/L — ABNORMAL LOW (ref 135–145)
TCO2: 27 mmol/L (ref 22–32)
TCO2: 22 mmol/L (ref 22–32)
pCO2, Ven: 39.9 mmHg — ABNORMAL LOW (ref 44–60)
pCO2, Ven: 34.5 mmHg — ABNORMAL LOW (ref 44–60)
pH, Ven: 7.416 (ref 7.25–7.43)
pH, Ven: 7.398 (ref 7.25–7.43)
pO2, Ven: 46 mmHg — ABNORMAL HIGH (ref 32–45)
pO2, Ven: 143 mmHg — ABNORMAL HIGH (ref 32–45)

## 2024-01-19 LAB — CBC WITH DIFFERENTIAL/PLATELET
Abs Immature Granulocytes: 0.01 K/uL (ref 0.00–0.07)
Basophils Absolute: 0 K/uL (ref 0.0–0.1)
Basophils Relative: 1 %
Eosinophils Absolute: 0.2 K/uL (ref 0.0–0.5)
Eosinophils Relative: 3 %
HCT: 40 % (ref 39.0–52.0)
Hemoglobin: 12.9 g/dL — ABNORMAL LOW (ref 13.0–17.0)
Immature Granulocytes: 0 %
Lymphocytes Relative: 24 %
Lymphs Abs: 1.1 K/uL (ref 0.7–4.0)
MCH: 30.6 pg (ref 26.0–34.0)
MCHC: 32.3 g/dL (ref 30.0–36.0)
MCV: 95 fL (ref 80.0–100.0)
Monocytes Absolute: 0.3 K/uL (ref 0.1–1.0)
Monocytes Relative: 7 %
Neutro Abs: 3.1 K/uL (ref 1.7–7.7)
Neutrophils Relative %: 65 %
Platelets: 351 K/uL (ref 150–400)
RBC: 4.21 MIL/uL — ABNORMAL LOW (ref 4.22–5.81)
RDW: 12.8 % (ref 11.5–15.5)
WBC: 4.8 K/uL (ref 4.0–10.5)
nRBC: 0 % (ref 0.0–0.2)

## 2024-01-19 LAB — POCT ABI - SCREENING FOR PILOT NO CHARGE

## 2024-01-19 LAB — CBG MONITORING, ED
Glucose-Capillary: 523 mg/dL (ref 70–99)
Glucose-Capillary: 190 mg/dL — ABNORMAL HIGH (ref 70–99)
Glucose-Capillary: 118 mg/dL — ABNORMAL HIGH (ref 70–99)
Glucose-Capillary: 90 mg/dL (ref 70–99)

## 2024-01-19 MED ORDER — SODIUM CHLORIDE 0.9 % IV BOLUS
1000.0000 mL | Freq: Once | INTRAVENOUS | Status: AC
  Administered 2024-01-19: 1000 mL via INTRAVENOUS

## 2024-01-19 MED ORDER — ATORVASTATIN CALCIUM 40 MG PO TABS
40.0000 mg | ORAL_TABLET | Freq: Every day | ORAL | 3 refills | Status: DC
  Filled 2024-01-19: qty 90, 90d supply, fill #0

## 2024-01-19 MED ORDER — INSULIN ASPART 100 UNIT/ML IJ SOLN
8.0000 [IU] | Freq: Once | INTRAMUSCULAR | Status: AC
  Administered 2024-01-19: 8 [IU] via SUBCUTANEOUS
  Filled 2024-01-19: qty 8

## 2024-01-19 MED ORDER — ACETAMINOPHEN 325 MG PO TABS
650.0000 mg | ORAL_TABLET | Freq: Once | ORAL | Status: AC
  Administered 2024-01-19: 650 mg via ORAL
  Filled 2024-01-19: qty 2

## 2024-01-19 MED ORDER — LACTATED RINGERS IV BOLUS: 1000.0000 mL | Freq: Once | INTRAVENOUS | Status: DC

## 2024-01-19 MED ORDER — INSULIN GLARGINE-YFGN 100 UNIT/ML ~~LOC~~ SOLN
10.0000 [IU] | Freq: Once | SUBCUTANEOUS | Status: DC
  Filled 2024-01-19: qty 0.1

## 2024-01-19 MED ORDER — INSULIN ASPART 100 UNIT/ML IJ SOLN
10.0000 [IU] | Freq: Once | INTRAMUSCULAR | Status: AC
  Administered 2024-01-19: 10 [IU] via SUBCUTANEOUS
  Filled 2024-01-19: qty 10

## 2024-01-19 NOTE — Progress Notes (Signed)
 Subjective:  Patient ID: David Wyatt, male    DOB: [birth year]  Age: 63 y.o. MRN: [id number]  CC: Medical Management of Chronic Issues (Discuss rehab wants to get off the street/)     Discussed the use of AI scribe software for clinical note transcription with the patient, who gave verbal consent to proceed.  History of Present Illness David Wyatt is a 63 year old male with a history of  type 2 diabetes mellitus, Homelessness, diabetic neuropathy, hypertension, chronic low back pain (status post previous back surgery), right shoulder osteoarthritis, renal calculi (status post cystoscopy, right ureteroscopy and right ureteral stent placement), medication non adherence, multiple ED visits who presents seeking assistance for rehabilitation and management of his diabetes.  Over the last couple of days he has had multiple ED visits for hyperglycemia including this morning where his blood sugar was actually found to be 190 and per notes he was noted to be malingering. 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 He acknowledges his addiction and is now seeking help.  He also goes on to tell me that it will be 20 degrees this evening and [facility] where he resides is not providing a shelter for residents.  His diabetes management is suboptimal with an A1c of 12.4 in September. He takes Lantus  at 10 units daily instead of the prescribed 12 units nightly and has not been using Novolog  with meals due to lack of access. Even small amounts of sugar cause significant blood sugar elevation per patient.  He experiences neuropathy with an inability to feel his feet and is concerned about potential foot loss. He lacks gabapentin  and Cymbalta  due to 'theft of his medications'.  He has a history of behavioral health issues, including Depression with  suicidal ideation, and has been unable to secure admission to a facility. He is currently without  stable housing, living on the street.  He has been noncompliant with following up with behavioral health or utilizing resources that have been provided to him by case management repeatedly.  He is well-known to the behavioral health response team and [facility] Department    Past Medical History:  Diagnosis Date   Angioedema of lips 07/28/2012   left upper (07/29/2012)   Arthritis    hands and back   Chronic back pain    Cocaine abuse (HCC)    Diabetic neuropathy (HCC)    High cholesterol    Hypertension    Neuropathy    Type II diabetes mellitus (HCC)     Past Surgical History:  Procedure Laterality Date   BACK SURGERY     CYSTOSCOPY W/ URETERAL STENT PLACEMENT Right 02/23/2021   Procedure: CYSTOSCOPY WITH RETROGRADE PYELOGRAM/URETERAL STENT PLACEMENT;  Surgeon: Selma Donnice SAUNDERS, MD;  Location: [facility];  Service: Urology;  Laterality: Right;   CYSTOSCOPY/URETEROSCOPY/HOLMIUM LASER/STENT PLACEMENT Right 12/05/2022   Procedure: CYSTOSCOPY, RIGHT URETEROSCOPY, AND RIGHT URETERAL STENT PLACEMENT;  Surgeon: Shane Steffan BROCKS, MD;  Location: [facility];  Service: Urology;  Laterality: Right;  90 MINUTES   HERNIA REPAIR Right 04/01/2012   I & D EXTREMITY Left 12/13/2012   Procedure: IRRIGATION AND DEBRIDEMENT LEFT THUMB;  Surgeon: Franky SAUNDERS Curia, MD;  Location: [facility] OR;  Service: Orthopedics;  Laterality: Left;   INCISION AND DRAINAGE OF WOUND     boil on back/notes 07/14/2008 (07/29/2012)   INGUINAL HERNIA REPAIR  04/01/2012   Procedure: HERNIA REPAIR INGUINAL ADULT;  Surgeon: Sherlean JINNY Laughter, MD;  Location: [facility] OR;  Service: General;  Laterality: Right;   INGUINAL HERNIA REPAIR Left 09/06/2016   Procedure: OPEN REPAIR LEFT INGUINAL HERNIA;  Surgeon: Tanda Locus, MD;  Location: [facility] OR;  Service: General;  Laterality: Left;   INSERTION OF MESH Left 09/06/2016   Procedure: INSERTION OF MESH;  Surgeon: Tanda Locus, MD;  Location: [facility] OR;  Service: General;  Laterality: Left;   TONSILLECTOMY       Family History  Problem Relation Age of Onset   Diabetes Mother    Kidney disease Mother    Hyperlipidemia Mother    Hyperlipidemia Father    Diabetes Father     Social History   Socioeconomic History   Marital status: Divorced    Spouse name: Not on file   Number of children: 3   Years of education: 12   Highest education level: Not on file  Occupational History    Comment: disabled  Tobacco Use   Smoking status: Former    Current packs/day: 0.25    Average packs/day: 0.3 packs/day for 30.0 years (7.5 ttl pk-yrs)    Types: Cigarettes   Smokeless tobacco: Former    Quit date: 08/15/2015   Tobacco comments:    04/29/16 2  cigs daily, 10/27/17 sometimes < .25 PPD  Vaping Use   Vaping status: Never Used  Substance and Sexual Activity   Alcohol use: Not Currently   Drug use: Not Currently   Sexual activity: Not Currently  Other Topics Concern   Not on file  Social History Narrative   04/29/17   Lives in shelter   Caffeine- a lot of  tea, coffee   Social Drivers of Corporate Investment Banker Strain: High Risk (01/15/2024)   Overall Financial Resource Strain (CARDIA)    Difficulty of Paying Living Expenses: Very hard  Food Insecurity: Food Insecurity Present (01/15/2024)   Hunger Vital Sign    Worried About Programme Researcher, Broadcasting/film/video in the Last Year: Often true    Ran Out of Food in the Last Year: Often true  Transportation Needs: Unmet Transportation Needs (01/15/2024)   PRAPARE - Administrator, Civil Service (Medical): Yes    Lack of Transportation (Non-Medical): Yes  Physical Activity: Not on file  Stress: Not on file  Social Connections: Unknown (10/24/2023)   Social Connection and Isolation Panel    Frequency of Communication with Friends and Family: Once a week    Frequency of Social Gatherings with Friends and Family: Never    Attends Religious Services: Never    Database Administrator or Organizations: No    Attends Engineer, Structural:  Never    Marital Status: Patient unable to answer    Allergies  Allergen Reactions   Lisinopril  Anaphylaxis, Swelling and Other (See Comments)    Lip Swelling and angioedema, too     Outpatient Medications Prior to Visit  Medication Sig Dispense Refill   Accu-Chek Softclix Lancets lancets Use to check blood sugar three times daily as directed 100 each 1   Blood Glucose Monitoring Suppl (ACCU-CHEK GUIDE ME) w/Device KIT Use to test blood sugar 3 times daily. 1 kit 0   gabapentin  (NEURONTIN ) 300 MG capsule Take 1 capsule (300 mg total) by mouth 3 (three) times daily. 90 capsule 0   glucose blood (ACCU-CHEK GUIDE TEST) test strip Use as instructed to check blood sugar three times daily (Patient taking differently: 1 each by Other route daily at 6 (six) AM. Use as  instructed to check blood sugar three times daily) 100 each 1   insulin  aspart (NOVOLOG ) 100 UNIT/ML FlexPen Inject 3 Units into the skin 3 (three) times daily with meals. 9 mL 11   insulin  glargine (LANTUS ) 100 UNIT/ML Solostar Pen Inject 12 Units into the skin at bedtime. 9 mL 11   Insulin  Pen Needle 32G X 4 MM MISC Use 1 each to inject insulin  into the skin 4 (four) times daily -  before meals and at bedtime. 100 each 11   metFORMIN  (GLUCOPHAGE ) 1000 MG tablet Take 1 tablet (1,000 mg total) by mouth 2 (two) times daily with a meal. 60 tablet 0   traZODone  (DESYREL ) 50 MG tablet Take 1 tablet (50 mg total) by mouth at bedtime as needed for sleep. 30 tablet 0   DULoxetine  (CYMBALTA ) 30 MG capsule Take 3 capsules (90 mg total) by mouth daily. (Patient not taking: Reported on 01/19/2024) 90 capsule 0   lidocaine  (LIDODERM ) 5 % Place 1 patch onto the skin daily. Remove & Discard patch within 12 hours or as directed by MD (Patient not taking: Reported on 01/19/2024) 30 patch 0   ondansetron  (ZOFRAN ) 4 MG tablet Take 1 tablet (4 mg total) by mouth every 6 (six) hours. (Patient not taking: Reported on 01/19/2024) 12 tablet 0   pantoprazole   (PROTONIX ) 40 MG tablet Take 1 tablet (40 mg total) by mouth daily. (Patient not taking: Reported on 01/19/2024) 30 tablet 1   No facility-administered medications prior to visit.     ROS Review of Systems  Constitutional:  Negative for activity change and appetite change.  HENT:  Negative for sinus pressure and sore throat.   Respiratory:  Negative for chest tightness, shortness of breath and wheezing.   Cardiovascular:  Negative for chest pain and palpitations.  Gastrointestinal:  Negative for abdominal distention, abdominal pain and constipation.  Genitourinary: Negative.   Musculoskeletal: Negative.   Psychiatric/Behavioral:  Negative for behavioral problems and dysphoric mood.     Objective:  BP 133/82   Pulse 73   Temp 98.4 F (36.9 C) (Oral)   Ht 5' 8 (1.727 m)   Wt 160 lb 9.6 oz (72.8 kg)   SpO2 98%   BMI 24.42 kg/m      01/19/2024    8:40 AM 01/19/2024    4:13 AM 01/19/2024    4:09 AM  BP/Weight  Systolic BP 133 158   Diastolic BP 82 99   Wt. (Lbs) 160.6  163.14  BMI 24.42 kg/m2  24.81 kg/m2      Physical Exam Constitutional:      Appearance: He is well-developed.  Cardiovascular:     Rate and Rhythm: Normal rate.     Heart sounds: Normal heart sounds. No murmur heard. Pulmonary:     Effort: Pulmonary effort is normal.     Breath sounds: Normal breath sounds. No wheezing or rales.  Chest:     Chest wall: No tenderness.  Abdominal:     General: Bowel sounds are normal. There is no distension.     Palpations: Abdomen is soft. There is no mass.     Tenderness: There is no abdominal tenderness.  Musculoskeletal:        General: Normal range of motion.     Right lower leg: No edema.     Left lower leg: No edema.  Neurological:     Mental Status: He is alert and oriented to person, place, and time.  Psychiatric:  Mood and Affect: Mood normal.    Diabetic Foot Exam - Simple   Simple Foot Form Diabetic Foot exam was performed with the  following findings: Yes 01/19/2024  9:00 AM  Visual Inspection No deformities, no ulcerations, no other skin breakdown bilaterally: Yes Sensation Testing Intact to touch and monofilament testing bilaterally: Yes Pulse Check See comments: Yes Comments Dorsalis pedis and Posterior tibialis not palpable bilaterally.        Latest Ref Rng & Units 01/19/2024    4:20 AM 01/18/2024    6:59 PM 01/17/2024   11:43 AM  CMP  Glucose 70 - 99 mg/dL 835  550  488   BUN 8 - 23 mg/dL 28  24  17    Creatinine 0.61 - 1.24 mg/dL 8.69  8.70  8.59   Sodium 135 - 145 mmol/L 135 - 145 mmol/L 142    143  135  135   Potassium 3.5 - 5.1 mmol/L 3.5 - 5.1 mmol/L 4.3    4.3  4.5  4.5   Chloride 98 - 111 mmol/L 106  102  99   CO2 22 - 32 mmol/L  25  23   Calcium  8.9 - 10.3 mg/dL  9.2  9.2     Lipid Panel     Component Value Date/Time   CHOL 197 10/23/2023 1153   CHOL 213 (H) 05/23/2021 0954   TRIG 113 10/23/2023 1153   HDL 62 10/23/2023 1153   HDL 71 05/23/2021 0954   CHOLHDL 3.2 10/23/2023 1153   VLDL 23 10/23/2023 1153   LDLCALC 112 (H) 10/23/2023 1153   LDLCALC 122 (H) 05/23/2021 0954    CBC    Component Value Date/Time   WBC 6.1 01/18/2024 1859   RBC 4.24 01/18/2024 1859   HGB 14.6 01/19/2024 0420   HGB 15.0 01/19/2024 0420   HGB 14.3 11/03/2019 1003   HCT 43.0 01/19/2024 0420   HCT 44.0 01/19/2024 0420   HCT 43.4 11/03/2019 1003   PLT 376 01/18/2024 1859   PLT 295 11/03/2019 1003   MCV 94.8 01/18/2024 1859   MCV 94 11/03/2019 1003   MCH 30.4 01/18/2024 1859   MCHC 32.1 01/18/2024 1859   RDW 12.9 01/18/2024 1859   RDW 12.4 11/03/2019 1003   LYMPHSABS 1.9 01/18/2024 1859   LYMPHSABS 1.9 11/03/2019 1003   MONOABS 0.5 01/18/2024 1859   EOSABS 0.2 01/18/2024 1859   EOSABS 0.3 11/03/2019 1003   BASOSABS 0.1 01/18/2024 1859   BASOSABS 0.1 11/03/2019 1003    Lab Results  Component Value Date   HGBA1C 12.4 (H) 12/06/2023       Assessment & Plan Cocaine use  disorder Chronic cocaine use disorder with recent use. Motivated to stop and seeking help. He also mentions the fact that it will be 20 degrees tonight and he is concerned about sleeping on the street and it is uncertain if this is the reason driving his request for inpatient rehab - Called [facility] for rehab admission process but will which is provided with a series of numbers to call for assistance. - Coordinated with case manager for rehab admission. - Behavioral health response team made aware and plan discussed with the patient  Type 2 diabetes mellitus with diabetic polyneuropathy Poor glycemic control with A1c of 12.4%. Neuropathy symptoms present. Non-adherence to Novolog  and Cymbalta . I have spoken with the case manager who verifies that his medications have been locked up in storage and he has no access to them. - Ensure use  of Novolog  with meals. - Encouraged adherence to diabetes medications, including Cymbalta .  Peripheral vascular disease of both legs Confirmed by ABI results. -Risk factor modification including glycemic control, statin use - Referred to vascular clinic for further evaluation and management.  Depression He has had previous suicidal ideations in the past, none at the moment.  Unsuccessful in obtaining behavioral health treatment due to patient noncompliance - He has also informed us  in the past that he states he is suicidal because that is the only way he can get people to listen to him - Noncompliant with medications   Sheltered homelessness Experiencing homelessness. - The case manager is here in the clinic as well as the ED have worked with him tirelessly providing him with resources but he never follows through  [employer] Due for vaccinations and routine screenings. - Referred to GI for colonoscopy.       Meds ordered this encounter  Medications   atorvastatin  (LIPITOR) 40 MG tablet    Sig: Take 1 tablet (40 mg total)  by mouth daily.    Dispense:  90 tablet    Refill:  3    Follow-up: Return in about 3 months (around 04/20/2024) for Chronic medical conditions.       Corrina Sabin, MD, FAAFP. [facility] and [facility]ness [place], [place] [phone]   01/19/2024, 11:45 AM

## 2024-01-19 NOTE — ED Notes (Signed)
 EVS in room

## 2024-01-19 NOTE — ED Notes (Signed)
 Patient ambulatory to the bathroom independently.

## 2024-01-19 NOTE — Telephone Encounter (Signed)
 I met with the patient when he was in the clinic today for his appointment. He stated he wants to go to inpatient rehab for his cocaine use.  He said he is really ready to quit and then went on to say that it is going to be 20 degrees outside tonight and he has nowhere to stay and he can't stay outside. I explained to him multiple times that he should be going to rehab to address the drug use and not just to have a place to stay.  He said he understood and he wants to quit the cocaine but then would continue to say that it's gong to be cold out tonight.  He denied any suicidal ideation throughout the time he was in the clinic   I told him that I would make some calls for him to check resources for inpatient rehab for his drug use.    I called [facility]: [phone] and was connected with the national hotline.  The representative provided me with contact information for the following facilities based on his insurance.  [facility] : [phone] [facility]: [phone] [facility] in [place]  I explained that the patient doesn't have transportation to those sites.  She then recommended that I call [facility]: [phone] and she also said that [facility] non -emergent transportation may be able to provide him a ride to the centers    I then called [facility] and spoke to the clinician, Eva.  She said that the patient's Tailored Care Management Agency is [facility]: [phone].  I had noted in Epic from a prior conversation with the patient that his tailored care manager is Tawni Lunger: [phone]. I then called Tawni and the voicemail is full. I called [facility] and was informed that Tawni Lunger is no longer there and his care manager is now Turley Hobgood: [phone]. I then called [facility] and left a message requesting a call back.   I called Tana Bamberger, crisis counselor [place] of  [place]- Behavioral Health Response Team and had to leave a message requesting a call back.   I then called [facility]: [phone] and explained that the patient is in the clinic and is seeking inpatient rehab for his cocaine use. The representative said we need to call EMS and have them transport him to ED and they will arrange for him to go to detox.  I explained all of my calls to the patient and he said he has been to the ED and they are not able to help him.  He also said he has been to [facility] and they are no help to him, they just let him go. He said he doesn't want to go there.  He then asked about a shelter in [place]. I called [facility]: [phone] and left a message on the men's shelter voicemail requesting a call back.   The patient spoke about detoxing and I called [facility] ED and the clinician there said that they don't detox for cocaine.  He would need to go to a [facility] facility or [facility].  The [facility] facility in [place] does provide detox.    The patient stated that he has no transportation to anywhere and would need to use the [employer] non -emergent transportation but there is no guarantee that the transportation could be arranged for a ride today.  He then said again that it is going  to be 20 degrees tonight and I reminded him that the rehab is to address the cocaine use not just to provide shelter for him and he said he understood.   I called [facility]: [phone] and spoke to [facility] who stated that they have a bed available but there is no guarantee that it would still be available when the patient arrives, it is first come, first served.  She then suggested that that the patient go to [facility] and be evaluated there first.    I explained this to the patient and he said that [facility] is not going to do anything and he was just there this morning.   The patient was given food while he was in the clinic.  He spoke  again about having a place to stay because it is going to be cold out.  I then received a call from Tana Bamberger, and he explained that he spoke to the patient and the social worker that was with him when he was at the hospital on Friday and he was supposed to call [facility]: [phone]  for assistance at that time and they would pick him up at the hospital and connect him with the appropriate resources.  Tana said the he has a veterinary surgeon at [employer], Arlyne Lesches but the patient did not call [facility] as instructed.  Tana then suggested the patient go to [facility] for assistance with getting in to a facility for detox. I told him that I would discuss this further with the patient and then call him back.   I spoke to the patient and he said he is not aware of [facility].   Tana then called me back and said that he was informed that the patient has been discharged from [facility].  He then said that if the patient is still not suicidal, and he really wants rehab for the cocaine use, he can go to [facility].  He said if he is suicidal, he I can call 911 for [employer] to transport him there.  He also asked that I tell the patient that he ETTER Tana) will meet with him at [facility] [employer] on 11/12 between 0800-1000 and he will help the patient enroll with [facility]  and they can discuss housing resources and he noted that housing does not come quickly. Tana also said that the patient can call him with any questions, he has his number; however I told him that I would give it to him again.    I spoke to the patient about my conversation with Tana and I provided him the information about the meeting on We, 11/12 and I gave him Qu[facility]'s phone number: [phone].  The patient continued to deny any thoughts of suicide and said [facility] would not do anything for him and he did not want to go there at this time.  At the patient's request, I  called [facility] to inquire if the white flag would be out tonight because of the cold weather and was told as of now, they are not putting the white flog out but the patient can call later to see if anything has changed. The patient said he understood but his phone does not always work, he needs [employer].  I gave him some bus passes and he left the clinic about noon.

## 2024-01-19 NOTE — ED Provider Notes (Signed)
 [place] EMERGENCY DEPARTMENT AT [facility] Provider Note   CSN: [id number] Arrival date & time: 01/19/24  0405     Patient presents with: Hyperglycemia   David Wyatt is a 63 y.o. male.  Patient with history of type II DM, 40 years reported crack cocaine use, history of malingering presents to the emergency department via EMS complaining of hyperglycemia.  Patient states he was at a different facility earlier who discharging with not checking blood sugar before discharge me.He endorses thirst but no other symptoms at this time.  Chart review shows multiple visits over the past 2 days related to hyperglycemia and malingering.  At the time of arrival CBG was noted to be 190.    Hyperglycemia      Prior to Admission medications   Medication Sig Start Date End Date Taking? Authorizing Provider  Accu-Chek Softclix Lancets lancets Use to check blood sugar three times daily as directed 11/01/23   Wyatt, Aryendra, MD  Blood Glucose Monitoring Suppl (ACCU-CHEK GUIDE ME) w/Device KIT Use to test blood sugar 3 times daily. 11/01/23   Wyatt, Aryendra, MD  DULoxetine  (CYMBALTA ) 30 MG capsule Take 3 capsules (90 mg total) by mouth daily. Patient not taking: Reported on 01/18/2024 12/13/23   Wyatt, Sree, MD  gabapentin  (NEURONTIN ) 300 MG capsule Take 1 capsule (300 mg total) by mouth 3 (three) times daily. 12/12/23   David Mellow, MD  glucose blood (ACCU-CHEK GUIDE TEST) test strip Use as instructed to check blood sugar three times daily Patient taking differently: 1 each by Other route daily at 6 (six) AM. Use as instructed to check blood sugar three times daily 11/01/23   Wyatt, Aryendra, MD  insulin  aspart (NOVOLOG ) 100 UNIT/ML FlexPen Inject 3 Units into the skin 3 (three) times daily with meals. 12/27/23   Wyatt, David A, PA-C  insulin  glargine (LANTUS ) 100 UNIT/ML Solostar Pen Inject 12 Units into the skin at bedtime. 12/27/23   Wyatt, David A, PA-C   Insulin  Pen Needle 32G X 4 MM MISC Use 1 each to inject insulin  into the skin 4 (four) times daily -  before meals and at bedtime. 12/28/23   Wyatt, David A, PA-C  lidocaine  (LIDODERM ) 5 % Place 1 patch onto the skin daily. Remove & Discard patch within 12 hours or as directed by MD Patient not taking: Reported on 01/18/2024 01/15/24   Wyatt, David K, DO  metFORMIN  (GLUCOPHAGE ) 1000 MG tablet Take 1 tablet (1,000 mg total) by mouth 2 (two) times daily with a meal. 12/12/23   David Mellow, MD  ondansetron  (ZOFRAN ) 4 MG tablet Take 1 tablet (4 mg total) by mouth every 6 (six) hours. Patient not taking: Reported on 01/18/2024 12/16/23   David David POUR, PA-C  pantoprazole  (PROTONIX ) 40 MG tablet Take 1 tablet (40 mg total) by mouth daily. Patient not taking: Reported on 01/18/2024 11/30/23   David Eland I, MD  traZODone  (DESYREL ) 50 MG tablet Take 1 tablet (50 mg total) by mouth at bedtime as needed for sleep. 12/12/23   Wyatt, Sree, MD    Allergies: Lisinopril     Review of Systems  Updated Vital Signs BP (!) 158/99 (BP Location: Right Arm)   Pulse 80   Temp 97.9 F (36.6 C) (Oral)   Resp 18   Ht 5' 8 (1.727 m)   Wt 74 kg   SpO2 97%   BMI 24.81 kg/m   Physical Exam Vitals and nursing note reviewed.  Constitutional:      Appearance:  Normal appearance.  Pulmonary:     Effort: Pulmonary effort is normal.  Abdominal:     General: Abdomen is flat.  Neurological:     Mental Status: He is alert.     (all labs ordered are listed, but only abnormal results are displayed) Labs Reviewed  CBG MONITORING, ED - Abnormal; Notable for the following components:      Result Value   Glucose-Capillary 190 (*)    All other components within normal limits  Wyatt-STAT VENOUS BLOOD GAS, ED - Abnormal; Notable for the following components:   pCO2, Ven 39.9 (*)    pO2, Ven 46 (*)    All other components within normal limits  Wyatt-STAT CHEM 8, ED - Abnormal; Notable for the following  components:   BUN 28 (*)    Creatinine, Ser 1.30 (*)    Glucose, Bld 164 (*)    All other components within normal limits  CBG MONITORING, ED - Abnormal; Notable for the following components:   Glucose-Capillary 118 (*)    All other components within normal limits    EKG: None  Radiology: No results found.   Procedures   Medications Ordered in the ED  acetaminophen  (TYLENOL ) tablet 650 mg (650 mg Oral Given 01/19/24 0508)                                    Medical Decision Making Risk OTC drugs.   Patient presents to the emergency room with a chief complaint of hyperglycemia.  Differential includes hyperglycemia, euglycemia, DKA, HHS, hypoglycemia, others  Labs were ordered and interpreted including a CBG, VBG, and Chem-8.  Initial CBG shows a glucose of 190.  Chem-8 shows glucose of 164, with repeat CBG showing glucose of 118.  Patient is not hyperglycemic.  His glucose is within normal limits at this time.  Chart review shows concerns for malingering even with the most recent hyperglycemic visit.  Wyatt discussed the results of the labs with the patient and explained that he was stable for discharge home and he then told me that he needed to stay for detox for his 40 years of crack cocaine abuse.  Wyatt explained to the patient that we do not have in-house detox and that Wyatt can provide resources but the patient declined resources.  Chart review shows he has gone to the behavioral health urgent care with the same complaint and was declined admission at that time.  At this time Wyatt see no indication for further emergent workup or admission.  Patient's glucose has stabilized.  Patient may discharge home.     Final diagnoses:  Hyperglycemia    ED Discharge Orders     None          David Wyatt 01/19/24 0559    David Lonni PARAS, MD 01/19/24 [phone]

## 2024-01-19 NOTE — ED Provider Triage Note (Signed)
 Emergency Medicine Provider Triage Evaluation Note  David Wyatt , a 63 y.o. male  was evaluated in triage.  Pt complains of here for hyperglycemia.  He has had multiple visits over the last 24 hours for similar was actually seen by PCP earlier today for similar.  He states it is cold outside.  He states his blood sugars have been elevated. States he has chronic back pain with is unchanged.  Review of Systems  Positive: Hyperglycemia, back pain- chronic Negative:   Physical Exam  BP (!) 152/79 (BP Location: Left Arm)   Pulse 75   Temp 97.8 F (36.6 C)   Resp 18   SpO2 99%  Gen:   Awake, no distress   Resp:  Normal effort  MSK:   Moves extremities without difficulty  Other:    Medical Decision Making  Medically screening exam initiated at 2:46 PM.  Appropriate orders placed.  David Wyatt was informed that the remainder of the evaluation will be completed by another provider, this initial triage assessment does not replace that evaluation, and the importance of remaining in the ED until their evaluation is complete.  Hyperglycemia   David Wyatt A, PA-C 01/19/24 1447

## 2024-01-19 NOTE — ED Triage Notes (Signed)
 Patient BIB [facility] due to hyperglycemia and cold exposure. Per EMS patient was d/c from [facility] earlier. CBG 451, VSS.

## 2024-01-19 NOTE — ED Notes (Signed)
 Patient very agitated stating he was sleeping in the waiting room and got woken up and he have no where to go and that the buses are not running at this time.

## 2024-01-19 NOTE — ED Notes (Signed)
 Patient became very agitated when he was told he was being discharged. Patient yelling and stating he can't get on the bus with those wet clothes. Patient was given another set of blue scrubs. Patient came out of room yelling at staff. Security notifed

## 2024-01-19 NOTE — ED Provider Notes (Signed)
 Aggressive patient who got out of hand while being allowed to sleep in waiting room.  Then BIB EMS for elevated glucose after eating graham crackers in waiting room.  Now yelling and refusing discharge.  He cannot stay in the lobby or in the back with this behavior and was escorted off property by security.     David Shepperson, MD 01/19/24 [phone]

## 2024-01-19 NOTE — ED Provider Notes (Signed)
 Behavioral Health Urgent Care Medical Screening Exam  Patient Name: David Wyatt MRN: [id number] Date of Evaluation: 01/18/2024 Chief Complaint:  I am homeless and nobody wants to help me Diagnosis:  Final diagnoses:  Malingering  Cocaine use disorder (HCC)    History of Present illness:David Wyatt 63 y.o., male patient presented to [facility] as a voluntary walk in accompanied by [facility] escort with complaints of wanting to stop using crack cocaine, which she has been abusing for 40+ years.David Wyatt, is seen face to face by this provider, consulted with Dr. Cole; and chart reviewed on 01/19/24.  Per chart review patient has a past psychiatric history of cocaine use, substance induced mood disorder, MDD, chronic homelessness and malingering.  Medical history significant for uncontrolled DM2 requiring insulin , diabetic neuropathy, hyperglycemic episodes as patient is noncompliant with his treatment, hypertension, GERD and history of kidney stones. This is his 24th ED visit in the past 2 months.  On evaluation David Wyatt reports that he has been using crack cocaine daily for the past 40+ years.  Patient states that he uses as much as he can afford.  When attempting to ask further substance abuse questions patient continuously states I do not remember.  Patient denies active suicidal ideations, homicidal ideations and psychotic symptoms.  Patient does report feeling depressed due to his current situation including homelessness and not being able to work as he is on disability. Patient interrupts assessment multiple times to ask for snacks.  Patient also asked multiple times during assessment  I do not actually get to help me are not ?  Informed patient that in order to appropriately treat/help him I will need to perform a thorough assessment.  Patient became upset stating y'all are not going to help me!  Provider attempted to explain resources for homelessness and substance abuse treatment  programs however, patient continued to demand admission and food.  Patient did agree to allow for CBD fingerstick and blood sugar was noted to be 406.  Patient was given a bag of baked chips.  As this provider return the office to look at medication history for insulin , patient began yelling demanding to leave and hitting the secure doors with his walker.  Patient was at this point escorted to lobby area where he continued to be disrespectful towards staff and eventually called 911.  Discussed blood sugar with Dr. Cole who agreed to offer patient his scheduled 3 units of NovoLog  however patient refused and left with EMS.   During evaluation David Wyatt is sitting up in assessment room, in no acute distress.  He is alert & oriented x 4, irritable, minimally cooperative and focused on admission/food throughout this assessment.  His mood is irritable with congruent affect.  He has normal speech with increased volume, and displays belligerent behavior towards staff.  Objectively there is no evidence of psychosis/mania or delusional thinking. Pt does not appear to be responding to internal or external stimuli.  Patient is able to converse coherently.  He denied active suicidal/self-harm/homicidal ideation, psychosis, and paranoia during assessment.  Patient answered question appropriately.      Flowsheet Row ED from 01/18/2024 in [facility] Emergency Department at [facility] Most recent reading at 01/18/2024  6:46 PM ED from 01/18/2024 in [facility] Most recent reading at 01/18/2024  3:04 PM ED from 01/17/2024 in [facility] Emergency Department at [facility] Most recent reading at 01/17/2024 11:37 AM  C-SSRS RISK CATEGORY No Risk No Risk Low Risk  Psychiatric Specialty Exam  Presentation  General Appearance:Casual; Disheveled  Eye Contact:Fair  Speech:Clear and Coherent  Speech Volume:Increased  Handedness:Right   Mood and Affect   Mood: Irritable  Affect: Congruent   Thought Process  Thought Processes: Coherent  Descriptions of Associations:Intact  Orientation:Full (Time, Place and Person)  Thought Content:WDL  Diagnosis of Schizophrenia or Schizoaffective disorder in past: No   Hallucinations:None  Ideas of Reference:None  Suicidal Thoughts:Yes, Passive With Plan Without Intent; Without Plan  Homicidal Thoughts:No   Sensorium  Memory: Recent Fair; Immediate Fair  Judgment: Poor  Insight: Poor   Executive Functions  Concentration: Fair  Attention Span: Fair  Recall: [employer] of Knowledge: Fair  Language: Fair   Psychomotor Activity  Psychomotor Activity: Normal   Assets  Assets: Manufacturing Systems Engineer; Desire for Improvement; Financial Resources/Insurance; Housing; Physical Health; Resilience; Social Support; Vocational/Educational   Sleep  Sleep: Fair  Number of hours:  6   Physical Exam: Physical Exam Vitals and nursing note reviewed.  Constitutional:      Appearance: Normal appearance.  HENT:     Head: Normocephalic.     Nose: Nose normal.  Eyes:     Extraocular Movements: Extraocular movements intact.  Cardiovascular:     Rate and Rhythm: Normal rate.  Pulmonary:     Effort: Pulmonary effort is normal.  Musculoskeletal:        General: Normal range of motion.     Cervical back: Normal range of motion.  Neurological:     General: No focal deficit present.     Mental Status: He is alert and oriented to person, place, and time.  Psychiatric:        Thought Content: Thought content normal.    Review of Systems  Constitutional: Negative.   HENT: Negative.    Eyes: Negative.   Respiratory: Negative.    Cardiovascular: Negative.   Gastrointestinal: Negative.   Genitourinary: Negative.   Musculoskeletal:  Positive for back pain.  Neurological: Negative.   Endo/Heme/Allergies: Negative.   Psychiatric/Behavioral:  Positive for depression.     Blood pressure (!) 140/85, pulse 85, temperature 98.3 F (36.8 C), temperature source Oral, resp. rate 18, SpO2 100%. There is no height or weight on file to calculate BMI.  Musculoskeletal: Strength & Muscle Tone: within normal limits Gait & Station: unsteady but uses a walker for chronic back pain and diabetic neuropathy Patient leans: N/A   [facility] MSE Discharge Disposition for Follow up and Recommendations: Based on my evaluation the patient does not appear to have an emergency medical condition and can be discharged with resources and follow up care in outpatient services for Medication Management, Substance Abuse Intensive Outpatient Program, and Individual Therapy  Patient discharged, and left AMA, refusing his 3 units of insulin  as well as education on the importance of medication compliance in the context of diabetes.  Pt demanding admission and food throughout assessment but not allowing a full assessment to determine needs. Pt required security escort after hitting secure doors with walker and called 911 on his own and left the building. Patient refused AVS which included multiple mental health and substance abuse resources as well as resources for homelessness and shelters.    David JAYSON Mcardle, NP 01/19/2024, 11:34 AM

## 2024-01-19 NOTE — ED Notes (Signed)
Patient provided with turkey sandwich.

## 2024-01-19 NOTE — ED Notes (Signed)
 Patient wants detox for crack cocaine. EDP notified. He is at bedside now.

## 2024-01-19 NOTE — ED Notes (Signed)
 Patient informed registration that he have to use the bathroom upon getting him a urinal patient stated he could not use the urinal with the pulse ox attached to him. Pulse ox removed patient noted urinating in the floor. Blue scrubs given.

## 2024-01-19 NOTE — ED Notes (Signed)
 Patient ambulated self to bathroom refused to use his walker

## 2024-01-19 NOTE — Discharge Instructions (Signed)
Return to the emergency department if you develop any life-threatening symptoms.

## 2024-01-19 NOTE — ED Provider Notes (Signed)
 [place] EMERGENCY DEPARTMENT AT [facility] Provider Note   CSN: [id number] Arrival date & time: 01/19/24  9876     Patient presents with: Hyperglycemia   David Wyatt is a 63 y.o. male.   The history is provided by the patient.  Hyperglycemia Blood sugar level PTA:  526 Severity:  Severe Onset quality:  Gradual Timing:  Constant Progression:  Unchanged Chronicity:  Recurrent Context: recent change in diet   Relieved by:  Nothing Ineffective treatments:  None tried Associated symptoms: no abdominal pain, no altered mental status and no fever   Was sleeping in lobby as per previous shift and discharged as patient was acting out and then called EMS and glucose was high post eating graham crackers.       Prior to Admission medications   Medication Sig Start Date End Date Taking? Authorizing Provider  Accu-Chek Softclix Lancets lancets Use to check blood sugar three times daily as directed 11/01/23   Shrivastava, Aryendra, MD  Blood Glucose Monitoring Suppl (ACCU-CHEK GUIDE ME) w/Device KIT Use to test blood sugar 3 times daily. 11/01/23   Shrivastava, Aryendra, MD  DULoxetine  (CYMBALTA ) 30 MG capsule Take 3 capsules (90 mg total) by mouth daily. Patient not taking: Reported on 01/18/2024 12/13/23   Jadapalle, Sree, MD  gabapentin  (NEURONTIN ) 300 MG capsule Take 1 capsule (300 mg total) by mouth 3 (three) times daily. 12/12/23   Donnelly Mellow, MD  glucose blood (ACCU-CHEK GUIDE TEST) test strip Use as instructed to check blood sugar three times daily Patient taking differently: 1 each by Other route daily at 6 (six) AM. Use as instructed to check blood sugar three times daily 11/01/23   Shrivastava, Aryendra, MD  insulin  aspart (NOVOLOG ) 100 UNIT/ML FlexPen Inject 3 Units into the skin 3 (three) times daily with meals. 12/27/23   Zelaya, Oscar A, PA-C  insulin  glargine (LANTUS ) 100 UNIT/ML Solostar Pen Inject 12 Units into the skin at bedtime. 12/27/23   Zelaya, Oscar  A, PA-C  Insulin  Pen Needle 32G X 4 MM MISC Use 1 each to inject insulin  into the skin 4 (four) times daily -  before meals and at bedtime. 12/28/23   Zelaya, Oscar A, PA-C  lidocaine  (LIDODERM ) 5 % Place 1 patch onto the skin daily. Remove & Discard patch within 12 hours or as directed by MD Patient not taking: Reported on 01/18/2024 01/15/24   Kingsley, Victoria K, DO  metFORMIN  (GLUCOPHAGE ) 1000 MG tablet Take 1 tablet (1,000 mg total) by mouth 2 (two) times daily with a meal. 12/12/23   Donnelly Mellow, MD  ondansetron  (ZOFRAN ) 4 MG tablet Take 1 tablet (4 mg total) by mouth every 6 (six) hours. Patient not taking: Reported on 01/18/2024 12/16/23   Robinson, John K, PA-C  pantoprazole  (PROTONIX ) 40 MG tablet Take 1 tablet (40 mg total) by mouth daily. Patient not taking: Reported on 01/18/2024 11/30/23   Rosario Eland I, MD  traZODone  (DESYREL ) 50 MG tablet Take 1 tablet (50 mg total) by mouth at bedtime as needed for sleep. 12/12/23   Donnelly Mellow, MD    Allergies: Lisinopril     Review of Systems  Constitutional:  Negative for fever.  Gastrointestinal:  Negative for abdominal pain.  All other systems reviewed and are negative.   Updated Vital Signs BP 136/75 (BP Location: Right Arm)   Pulse 63   Temp 98.1 F (36.7 C) (Oral)   Resp 17   Ht 5' 8 (1.727 m)   Wt 74.6 kg  SpO2 97%   BMI 25.01 kg/m   Physical Exam Vitals and nursing note reviewed.  Constitutional:      General: He is not in acute distress.    Appearance: He is well-developed. He is not diaphoretic.  HENT:     Head: Normocephalic and atraumatic.     Nose: Nose normal.  Eyes:     Conjunctiva/sclera: Conjunctivae normal.     Pupils: Pupils are equal, round, and reactive to light.  Cardiovascular:     Rate and Rhythm: Normal rate and regular rhythm.     Pulses: Normal pulses.     Heart sounds: Normal heart sounds.  Pulmonary:     Effort: Pulmonary effort is normal.     Breath sounds: Normal breath sounds.  No wheezing or rales.  Abdominal:     General: Bowel sounds are normal.     Palpations: Abdomen is soft.     Tenderness: There is no abdominal tenderness. There is no guarding or rebound.  Musculoskeletal:        General: Normal range of motion.     Cervical back: Normal range of motion and neck supple.  Skin:    General: Skin is warm and dry.     Capillary Refill: Capillary refill takes less than 2 seconds.  Neurological:     General: No focal deficit present.     Mental Status: He is alert and oriented to person, place, and time.     Deep Tendon Reflexes: Reflexes normal.     (all labs ordered are listed, but only abnormal results are displayed) Labs Reviewed  CBG MONITORING, ED - Abnormal; Notable for the following components:      Result Value   Glucose-Capillary 523 (*)    All other components within normal limits    EKG: None  Radiology: No results found.   Procedures   Medications Ordered in the ED  insulin  aspart (novoLOG ) injection 8 Units (8 Units Subcutaneous Given 01/19/24 0246)                                    Medical Decision Making BIB EMS for high glucose within 30 minutes of discharge   Amount and/or Complexity of Data Reviewed External Data Reviewed: notes.    Details: Previous ED note reviewed   Risk Prescription drug management. Risk Details: Treated and discharged from the ED    Final diagnoses:  Hyperglycemia    No signs of systemic illness or infection. The patient is nontoxic-appearing on exam and vital signs are within normal limits.  I have reviewed the triage vital signs and the nursing notes. Pertinent labs & imaging results that were available during my care of the patient were reviewed by me and considered in my medical decision making (see chart for details). After history, exam, and medical workup I feel the patient has been appropriately medically screened and is safe for discharge home. Pertinent diagnoses were discussed  with the patient. Patient was given return precautions.      ED Discharge Orders     None          Skylarr Liz, MD 01/19/24 [phone]

## 2024-01-19 NOTE — ED Provider Notes (Signed)
 [place] EMERGENCY DEPARTMENT AT [facility] Provider Note   CSN: [id number] Arrival date & time: 01/19/24  1437     Patient presents with: Hyperglycemia   David Wyatt is a 63 y.o. male.   63 year old male presents today for concern of elevated blood sugars.  He is also requesting rehab for crack cocaine.  Does not check his blood sugars.  States he has not had any significant p.o. intake.  Has not taken his insulin  today.  He denies any shortness of breath, abdominal pain, nausea, vomiting or other complaints.  The history is provided by the patient. No language interpreter was used.       Prior to Admission medications   Medication Sig Start Date End Date Taking? Authorizing Provider  Accu-Chek Softclix Lancets lancets Use to check blood sugar three times daily as directed 11/01/23   Shrivastava, Aryendra, MD  atorvastatin  (LIPITOR) 40 MG tablet Take 1 tablet (40 mg total) by mouth daily. 01/19/24   Newlin, Enobong, MD  Blood Glucose Monitoring Suppl (ACCU-CHEK GUIDE ME) w/Device KIT Use to test blood sugar 3 times daily. 11/01/23   Shrivastava, Aryendra, MD  DULoxetine  (CYMBALTA ) 30 MG capsule Take 3 capsules (90 mg total) by mouth daily. Patient not taking: Reported on 01/19/2024 12/13/23   Jadapalle, Sree, MD  gabapentin  (NEURONTIN ) 300 MG capsule Take 1 capsule (300 mg total) by mouth 3 (three) times daily. 12/12/23   Donnelly Mellow, MD  glucose blood (ACCU-CHEK GUIDE TEST) test strip Use as instructed to check blood sugar three times daily Patient taking differently: 1 each by Other route daily at 6 (six) AM. Use as instructed to check blood sugar three times daily 11/01/23   Shrivastava, Aryendra, MD  insulin  aspart (NOVOLOG ) 100 UNIT/ML FlexPen Inject 3 Units into the skin 3 (three) times daily with meals. 12/27/23   Zelaya, Oscar A, PA-C  insulin  glargine (LANTUS ) 100 UNIT/ML Solostar Pen Inject 12 Units into the skin at bedtime. 12/27/23   Zelaya, Oscar A, PA-C   Insulin  Pen Needle 32G X 4 MM MISC Use 1 each to inject insulin  into the skin 4 (four) times daily -  before meals and at bedtime. 12/28/23   Zelaya, Oscar A, PA-C  lidocaine  (LIDODERM ) 5 % Place 1 patch onto the skin daily. Remove & Discard patch within 12 hours or as directed by MD Patient not taking: Reported on 01/19/2024 01/15/24   Kingsley, Victoria K, DO  metFORMIN  (GLUCOPHAGE ) 1000 MG tablet Take 1 tablet (1,000 mg total) by mouth 2 (two) times daily with a meal. 12/12/23   Donnelly Mellow, MD  ondansetron  (ZOFRAN ) 4 MG tablet Take 1 tablet (4 mg total) by mouth every 6 (six) hours. Patient not taking: Reported on 01/19/2024 12/16/23   Lang Norleen POUR, PA-C  pantoprazole  (PROTONIX ) 40 MG tablet Take 1 tablet (40 mg total) by mouth daily. Patient not taking: Reported on 01/19/2024 11/30/23   Rosario Eland I, MD  traZODone  (DESYREL ) 50 MG tablet Take 1 tablet (50 mg total) by mouth at bedtime as needed for sleep. 12/12/23   Donnelly Mellow, MD    Allergies: Lisinopril     Review of Systems  Constitutional:  Negative for chills and fever.  Respiratory:  Negative for shortness of breath.   Cardiovascular:  Negative for chest pain.  Gastrointestinal:  Negative for abdominal pain, nausea and vomiting.  Neurological:  Negative for light-headedness.  All other systems reviewed and are negative.   Updated Vital Signs BP (!) 154/72   Pulse  71   Temp 97.8 F (36.6 C)   Resp 18   SpO2 100%   Physical Exam Vitals and nursing note reviewed.  Constitutional:      General: He is not in acute distress.    Appearance: He is not ill-appearing.     Comments: Disheveled appearance  HENT:     Head: Normocephalic and atraumatic.     Nose: Nose normal.  Eyes:     Conjunctiva/sclera: Conjunctivae normal.  Cardiovascular:     Rate and Rhythm: Normal rate and regular rhythm.  Pulmonary:     Effort: Pulmonary effort is normal. No respiratory distress.  Abdominal:     General: There is no  distension.     Palpations: Abdomen is soft.     Tenderness: There is no abdominal tenderness. There is no guarding.  Musculoskeletal:        General: No deformity. Normal range of motion.     Cervical back: Normal range of motion.  Skin:    Findings: No rash.  Neurological:     Mental Status: He is alert.     (all labs ordered are listed, but only abnormal results are displayed) Labs Reviewed  CBC WITH DIFFERENTIAL/PLATELET - Abnormal; Notable for the following components:      Result Value   RBC 4.21 (*)    Hemoglobin 12.9 (*)    All other components within normal limits  URINALYSIS, W/ REFLEX TO CULTURE (INFECTION SUSPECTED) - Abnormal; Notable for the following components:   Color, Urine COLORLESS (*)    Glucose, UA >=500 (*)    All other components within normal limits  I-STAT CHEM 8, ED - Abnormal; Notable for the following components:   Sodium 130 (*)    Glucose, Bld >700 (*)    All other components within normal limits  I-STAT VENOUS BLOOD GAS, ED - Abnormal; Notable for the following components:   pCO2, Ven 34.5 (*)    pO2, Ven 143 (*)    Acid-base deficit 3.0 (*)    Sodium 130 (*)    All other components within normal limits  CBG MONITORING, ED    EKG: None  Radiology: No results found.   Procedures   Medications Ordered in the ED  insulin  glargine-yfgn (SEMGLEE ) injection 10 Units (0 Units Subcutaneous Hold 01/19/24 2107)  sodium chloride  0.9 % bolus 1,000 mL (0 mLs Intravenous Stopped 01/19/24 2044)  insulin  aspart (novoLOG ) injection 10 Units (10 Units Subcutaneous Given 01/19/24 1823)                                    Medical Decision Making Risk Prescription drug management.   Medical Decision Making / ED Course   This patient presents to the ED for concern of elevated blood sugar, this involves an extensive number of treatment options, and is a complaint that carries with it a high risk of complications and morbidity.  The differential  diagnosis includes DKA, HHS, hyperglycemia  MDM: 63 year old male presents with above-mentioned complaints. Without acute distress. Blood sugar greater than 700 on Chem-8 panel. Without acidosis. No evidence of DKA or HHS. Will give insulin  and fluid bolus. On reevaluation blood sugar 90. Patient tolerated p.o. intake without difficulty. Presents frequently for elevated blood sugar. Workup today is reassuring. Patient discussed with attending. Patient discharged in stable condition. Outpatient rehab resources provided.   Additional history obtained: -Additional history obtained from chart review -External records  from outside source obtained and reviewed including: Chart review including previous notes, labs, imaging, consultation notes   Lab Tests: -I ordered, reviewed, and interpreted labs.   The pertinent results include:   Labs Reviewed  CBC WITH DIFFERENTIAL/PLATELET - Abnormal; Notable for the following components:      Result Value   RBC 4.21 (*)    Hemoglobin 12.9 (*)    All other components within normal limits  URINALYSIS, W/ REFLEX TO CULTURE (INFECTION SUSPECTED) - Abnormal; Notable for the following components:   Color, Urine COLORLESS (*)    Glucose, UA >=500 (*)    All other components within normal limits  I-STAT CHEM 8, ED - Abnormal; Notable for the following components:   Sodium 130 (*)    Glucose, Bld >700 (*)    All other components within normal limits  I-STAT VENOUS BLOOD GAS, ED - Abnormal; Notable for the following components:   pCO2, Ven 34.5 (*)    pO2, Ven 143 (*)    Acid-base deficit 3.0 (*)    Sodium 130 (*)    All other components within normal limits  CBG MONITORING, ED      EKG  EKG Interpretation Date/Time:    Ventricular Rate:    PR Interval:    QRS Duration:    QT Interval:    QTC Calculation:   R Axis:      Text Interpretation:         Medicines ordered and prescription drug management: Meds ordered this encounter   Medications   sodium chloride  0.9 % bolus 1,000 mL   insulin  aspart (novoLOG ) injection 10 Units   insulin  glargine-yfgn (SEMGLEE ) injection 10 Units    -I have reviewed the patients home medicines and have made adjustments as needed  Critical interventions Insulin , fluid bolus  Social Determinants of Health:  Factors impacting patients care include: Homeless   Reevaluation: After the interventions noted above, I reevaluated the patient and found that they have :improved  Co morbidities that complicate the patient evaluation  Past Medical History:  Diagnosis Date   Angioedema of lips 07/28/2012   left upper (07/29/2012)   Arthritis    hands and back   Chronic back pain    Cocaine abuse (HCC)    Diabetic neuropathy (HCC)    High cholesterol    Hypertension    Neuropathy    Type II diabetes mellitus (HCC)       Dispostion: Discharged in stable condition.  Return precaution discussed.  Patient voices understanding and is in agreement with plan.  Final diagnoses:  Hyperglycemia    ED Discharge Orders     None          Hildegard Loge, PA-C 01/19/24 2117    Mannie Pac T, DO 01/20/24 2326

## 2024-01-19 NOTE — Telephone Encounter (Signed)
 I received a call from Delon Gill, [facility] and updated her on the patient's status and visit to this clinic today.   She said her last contact with him was 11/2023 but she would try to reach out to him again.  Unfortunately, there is no immediate answer to his homelessness.

## 2024-01-19 NOTE — ED Triage Notes (Signed)
 [place]. 461 with EMS. Fourth visit in 24 hours for same. Also complains of back pain.

## 2024-01-19 NOTE — Discharge Instructions (Signed)
 Take your insulin  as directed by your primary care doctor.  Return for any concerning symptoms.  Resource guide attached above for you.

## 2024-01-19 NOTE — ED Triage Notes (Signed)
 Patient brought in by EMS from outside the hospital due to being cold. Vital stable with EMS however CBG 526.

## 2024-01-20 ENCOUNTER — Emergency Department (HOSPITAL_COMMUNITY)
Admission: EM | Admit: 2024-01-20 | Discharge: 2024-01-21 | Disposition: A | Discharge status: Home / Self Care | Payer: MEDICAID | Source: Home / Self Care | Attending: Emergency Medicine | Admitting: Emergency Medicine

## 2024-01-20 ENCOUNTER — Other Ambulatory Visit: Payer: Self-pay

## 2024-01-20 ENCOUNTER — Encounter (HOSPITAL_COMMUNITY): Payer: Self-pay

## 2024-01-20 ENCOUNTER — Emergency Department (HOSPITAL_COMMUNITY)
Admission: EM | Admit: 2024-01-20 | Discharge: 2024-01-20 | Disposition: A | Discharge status: Home / Self Care | Payer: MEDICAID | Source: Home / Self Care | Attending: Emergency Medicine | Admitting: Emergency Medicine

## 2024-01-20 ENCOUNTER — Emergency Department (HOSPITAL_COMMUNITY)
Admission: EM | Admit: 2024-01-20 | Discharge: 2024-01-20 | Disposition: A | Discharge status: Home / Self Care | Payer: MEDICAID | Attending: Emergency Medicine | Admitting: Emergency Medicine

## 2024-01-20 DIAGNOSIS — I10 Essential (primary) hypertension: Secondary | ICD-10-CM | POA: Diagnosis not present

## 2024-01-20 DIAGNOSIS — F17210 Nicotine dependence, cigarettes, uncomplicated: Secondary | ICD-10-CM | POA: Insufficient documentation

## 2024-01-20 DIAGNOSIS — E1165 Type 2 diabetes mellitus with hyperglycemia: Secondary | ICD-10-CM | POA: Insufficient documentation

## 2024-01-20 DIAGNOSIS — M791 Myalgia, unspecified site: Secondary | ICD-10-CM | POA: Diagnosis present

## 2024-01-20 DIAGNOSIS — R52 Pain, unspecified: Secondary | ICD-10-CM

## 2024-01-20 DIAGNOSIS — E119 Type 2 diabetes mellitus without complications: Secondary | ICD-10-CM | POA: Diagnosis not present

## 2024-01-20 DIAGNOSIS — Z5900 Homelessness unspecified: Secondary | ICD-10-CM | POA: Insufficient documentation

## 2024-01-20 DIAGNOSIS — E1140 Type 2 diabetes mellitus with diabetic neuropathy, unspecified: Secondary | ICD-10-CM | POA: Diagnosis not present

## 2024-01-20 DIAGNOSIS — Z7984 Long term (current) use of oral hypoglycemic drugs: Secondary | ICD-10-CM | POA: Diagnosis not present

## 2024-01-20 DIAGNOSIS — G629 Polyneuropathy, unspecified: Secondary | ICD-10-CM | POA: Diagnosis present

## 2024-01-20 DIAGNOSIS — Z794 Long term (current) use of insulin: Secondary | ICD-10-CM | POA: Insufficient documentation

## 2024-01-20 DIAGNOSIS — R739 Hyperglycemia, unspecified: Secondary | ICD-10-CM

## 2024-01-20 LAB — CBC
HCT: 42.3 % (ref 39.0–52.0)
Hemoglobin: 13.8 g/dL (ref 13.0–17.0)
MCH: 31 pg (ref 26.0–34.0)
MCHC: 32.6 g/dL (ref 30.0–36.0)
MCV: 95.1 fL (ref 80.0–100.0)
Platelets: 376 K/uL (ref 150–400)
RBC: 4.45 MIL/uL (ref 4.22–5.81)
RDW: 12.6 % (ref 11.5–15.5)
WBC: 5 K/uL (ref 4.0–10.5)
nRBC: 0 % (ref 0.0–0.2)

## 2024-01-20 LAB — URINALYSIS, ROUTINE W REFLEX MICROSCOPIC
Bacteria, UA: NONE SEEN
Bilirubin Urine: NEGATIVE
Glucose, UA: >=500 mg/dL — AB
Hgb urine dipstick: NEGATIVE
Ketones, ur: NEGATIVE mg/dL
Leukocytes,Ua: NEGATIVE
Nitrite: NEGATIVE
Protein, ur: NEGATIVE mg/dL
Specific Gravity, Urine: 1.021 (ref 1.005–1.030)
pH: 6 (ref 5.0–8.0)

## 2024-01-20 LAB — I-STAT CHEM 8, ED
BUN: 21 mg/dL (ref 8–23)
Calcium, Ion: 1.03 mmol/L — ABNORMAL LOW (ref 1.15–1.40)
Chloride: 102 mmol/L (ref 98–111)
Creatinine, Ser: 1.2 mg/dL (ref 0.61–1.24)
Glucose, Bld: 505 mg/dL (ref 70–99)
HCT: 42 % (ref 39.0–52.0)
Hemoglobin: 14.3 g/dL (ref 13.0–17.0)
Potassium: 4.6 mmol/L (ref 3.5–5.1)
Sodium: 134 mmol/L — ABNORMAL LOW (ref 135–145)
TCO2: 23 mmol/L (ref 22–32)

## 2024-01-20 LAB — CBG MONITORING, ED
Glucose-Capillary: 446 mg/dL — ABNORMAL HIGH (ref 70–99)
Glucose-Capillary: 434 mg/dL — ABNORMAL HIGH (ref 70–99)
Glucose-Capillary: 401 mg/dL — ABNORMAL HIGH (ref 70–99)
Glucose-Capillary: 201 mg/dL — ABNORMAL HIGH (ref 70–99)

## 2024-01-20 MED ORDER — INSULIN ASPART 100 UNIT/ML IJ SOLN: 10.0000 [IU] | Freq: Once | INTRAMUSCULAR | Status: DC

## 2024-01-20 MED ORDER — INSULIN ASPART 100 UNIT/ML IJ SOLN: 9.0000 [IU] | Freq: Once | INTRAMUSCULAR | Status: DC

## 2024-01-20 MED ORDER — INSULIN ASPART 100 UNIT/ML IJ SOLN
10.0000 [IU] | Freq: Once | INTRAMUSCULAR | Status: AC
  Administered 2024-01-20: 10 [IU] via SUBCUTANEOUS
  Filled 2024-01-20: qty 10

## 2024-01-20 MED ORDER — SODIUM CHLORIDE 0.9 % IV BOLUS: 2000.0000 mL | Freq: Once | INTRAVENOUS | Status: DC

## 2024-01-20 MED ORDER — INSULIN GLARGINE-YFGN 100 UNIT/ML ~~LOC~~ SOLN
8.0000 [IU] | SUBCUTANEOUS | Status: AC
  Administered 2024-01-20: 8 [IU] via SUBCUTANEOUS
  Filled 2024-01-20: qty 0.08

## 2024-01-20 MED ORDER — LACTATED RINGERS IV BOLUS: 1000.0000 mL | Freq: Once | INTRAVENOUS | Status: DC

## 2024-01-20 MED ORDER — INSULIN GLARGINE-YFGN 100 UNIT/ML ~~LOC~~ SOLN: 12.0000 [IU] | SUBCUTANEOUS | Status: DC

## 2024-01-20 NOTE — ED Provider Triage Note (Signed)
 Emergency Medicine Provider Triage Evaluation Note  David Wyatt , a 63 y.o. male with a history of insulin -dependent type 2 diabetes and crack cocaine use who was evaluated in EMS triage.  Pt complains of hyperglycemia and polyuria.  Seen here many times over the last week.  Does not have his diabetes medications at home stating they are locked away in his storage unit.  Wants to detox from crack cocaine.  No fevers chills chest pain shortness of breath  Review of Systems  Positive: As above Negative: As above  Physical Exam  BP (!) 169/103   Pulse 77   Temp 98.4 F (36.9 C) (Oral)   Resp 16   Ht 5' 8 (1.727 m)   Wt 72.8 kg   SpO2 100%   BMI 24.40 kg/m  Gen:   Awake, no distress   Resp:  Normal effort  MSK:   Moves extremities without difficulty  Other:    Medical Decision Making  Medically screening exam initiated at 10:42 AM.  Appropriate orders placed.  David Wyatt was informed that the remainder of the evaluation will be completed by another provider, this initial triage assessment does not replace that evaluation, and the importance of remaining in the ED until their evaluation is complete.     Pamella Ozell LABOR, DO 01/20/24 1050

## 2024-01-20 NOTE — ED Provider Notes (Signed)
 [place] EMERGENCY DEPARTMENT AT [facility] Provider Note   CSN: [id number] Arrival date & time: 01/20/24  1019     Patient presents with: Medical Management of Chronic Issues   David Wyatt is a 63 y.o. male.   63 year old male history of insulin -dependent diabetes and homelessness who presents to the emergency department requesting food.  Patient comes to the emergency department almost daily.  Was here this morning for his neuropathy.  Left abdomen says that he got hungry so he came back for something to eat.  Blood sugar was checked and was found to be 446 so he was brought back to a room.  Says that he has not been taking his long and short acting insulin .  No shortness of breath, nausea or vomiting.  Is thirsty and has had increased urinary frequency recently.  Patient is requesting something to eat at this point in time       Prior to Admission medications   Medication Sig Start Date End Date Taking? Authorizing Provider  Accu-Chek Softclix Lancets lancets Use to check blood sugar three times daily as directed 11/01/23   Shrivastava, Aryendra, MD  atorvastatin  (LIPITOR) 40 MG tablet Take 1 tablet (40 mg total) by mouth daily. 01/19/24   Newlin, Enobong, MD  Blood Glucose Monitoring Suppl (ACCU-CHEK GUIDE ME) w/Device KIT Use to test blood sugar 3 times daily. 11/01/23   Shrivastava, Aryendra, MD  DULoxetine  (CYMBALTA ) 30 MG capsule Take 3 capsules (90 mg total) by mouth daily. Patient not taking: Reported on 01/19/2024 12/13/23   Jadapalle, Sree, MD  gabapentin  (NEURONTIN ) 300 MG capsule Take 1 capsule (300 mg total) by mouth 3 (three) times daily. 12/12/23   Donnelly Mellow, MD  glucose blood (ACCU-CHEK GUIDE TEST) test strip Use as instructed to check blood sugar three times daily Patient taking differently: 1 each by Other route daily at 6 (six) AM. Use as instructed to check blood sugar three times daily 11/01/23   Shrivastava, Aryendra, MD  insulin  aspart (NOVOLOG )  100 UNIT/ML FlexPen Inject 3 Units into the skin 3 (three) times daily with meals. 12/27/23   Zelaya, Oscar A, PA-C  insulin  glargine (LANTUS ) 100 UNIT/ML Solostar Pen Inject 12 Units into the skin at bedtime. 12/27/23   Zelaya, Oscar A, PA-C  Insulin  Pen Needle 32G X 4 MM MISC Use 1 each to inject insulin  into the skin 4 (four) times daily -  before meals and at bedtime. 12/28/23   Zelaya, Oscar A, PA-C  lidocaine  (LIDODERM ) 5 % Place 1 patch onto the skin daily. Remove & Discard patch within 12 hours or as directed by MD Patient not taking: Reported on 01/19/2024 01/15/24   Kingsley, Victoria K, DO  metFORMIN  (GLUCOPHAGE ) 1000 MG tablet Take 1 tablet (1,000 mg total) by mouth 2 (two) times daily with a meal. 12/12/23   Donnelly Mellow, MD  ondansetron  (ZOFRAN ) 4 MG tablet Take 1 tablet (4 mg total) by mouth every 6 (six) hours. Patient not taking: Reported on 01/19/2024 12/16/23   Lang Norleen POUR, PA-C  pantoprazole  (PROTONIX ) 40 MG tablet Take 1 tablet (40 mg total) by mouth daily. Patient not taking: Reported on 01/19/2024 11/30/23   Rosario Eland I, MD  traZODone  (DESYREL ) 50 MG tablet Take 1 tablet (50 mg total) by mouth at bedtime as needed for sleep. 12/12/23   Jadapalle, Sree, MD    Allergies: Lisinopril     Review of Systems  Updated Vital Signs BP (!) 156/92 (BP Location: Right Arm)  Pulse 78   Temp 97.7 F (36.5 C) (Oral)   Resp 18   Ht 5' 8 (1.727 m)   Wt 72.8 kg   SpO2 99%   BMI 24.40 kg/m   Physical Exam Constitutional:      Appearance: Normal appearance.  HENT:     Head: Normocephalic and atraumatic.  Eyes:     Extraocular Movements: Extraocular movements intact.     Conjunctiva/sclera: Conjunctivae normal.     Pupils: Pupils are equal, round, and reactive to light.  Pulmonary:     Effort: Pulmonary effort is normal.     Breath sounds: Normal breath sounds.  Neurological:     Mental Status: He is alert and oriented to person, place, and time. Mental status  is at baseline.     Cranial Nerves: No cranial nerve deficit.     Sensory: No sensory deficit.     Motor: No weakness.     (all labs ordered are listed, but only abnormal results are displayed) Labs Reviewed  URINALYSIS, ROUTINE W REFLEX MICROSCOPIC - Abnormal; Notable for the following components:      Result Value   Color, Urine STRAW (*)    Glucose, UA >=500 (*)    All other components within normal limits  CBG MONITORING, ED - Abnormal; Notable for the following components:   Glucose-Capillary 446 (*)    All other components within normal limits  CBG MONITORING, ED - Abnormal; Notable for the following components:   Glucose-Capillary 434 (*)    All other components within normal limits  I-STAT CHEM 8, ED - Abnormal; Notable for the following components:   Sodium 134 (*)    Glucose, Bld 505 (*)    Calcium , Ion 1.03 (*)    All other components within normal limits  CBG MONITORING, ED - Abnormal; Notable for the following components:   Glucose-Capillary 401 (*)    All other components within normal limits  CBG MONITORING, ED - Abnormal; Notable for the following components:   Glucose-Capillary 201 (*)    All other components within normal limits  CBC    EKG: None  Radiology: No results found.   Procedures   Medications Ordered in the ED  insulin  glargine-yfgn (SEMGLEE ) injection 8 Units (8 Units Subcutaneous Given 01/20/24 1555)  insulin  aspart (novoLOG ) injection 10 Units (10 Units Subcutaneous Given 01/20/24 1554)                                    Medical Decision Making Amount and/or Complexity of Data Reviewed Labs: ordered.  Risk Prescription drug management.   David Wyatt is a 63 year old male with a history of insulin -dependent diabetes, homelessness, and near daily emergency department visits who comes in requesting food  Initial Ddx:  Hunger, homelessness, hyperglycemia  MDM/Course:  Patient presents emergency department requesting food.   CBG was checked and was found to have blood sugar of 505.  Patient was given long and short acting insulin  with improvement of his blood sugar to 201.  He is tolerating p.o.  Upon re-evaluation overall well-appearing.  Urinalysis not consistent with UTI.  Remainder of her labs lab work was reassuring and does not appear that he is in DKA.  Instructed to follow-up with his primary doctor and to continue to take his insulin .  Return precautions discussed prior to discharge.    This patient presents to the ED for concern of complaints listed in  HPI, this involves an extensive number of treatment options, and is a complaint that carries with it a high risk of complications and morbidity. Disposition including potential need for admission considered.   Dispo: DC Home. Return precautions discussed including, but not limited to, those listed in the AVS. Allowed pt time to ask questions which were answered fully prior to dc.  Records reviewed ED Visit Notes The following labs were independently interpreted: Chemistry and show Hyperglycemia I have reviewed the patients home medications and made adjustments as needed Social Determinants of health:  HOMELESSNESS  Portions of this note were generated with Scientist, clinical (histocompatibility and immunogenetics). Dictation errors may occur despite best attempts at proofreading.     Final diagnoses:  Hyperglycemia  Homelessness    ED Discharge Orders     None          Yolande Lamar BROCKS, MD 01/20/24 2137

## 2024-01-20 NOTE — Discharge Instructions (Signed)
 You were evaluated in the Emergency Department and after careful evaluation, we did not find any emergent condition requiring admission or further testing in the hospital.  Your exam/testing today is overall reassuring.  Use your home medications and follow-up with your primary care doctor.  Please return to the Emergency Department if you experience any worsening of your condition.   Thank you for allowing us  to be a part of your care.

## 2024-01-20 NOTE — ED Triage Notes (Signed)
 Patient states that they will not let me stay in the lobby but the doctor said I can wait in the lobby . I'ts cold out there  , no complaints , just discharged this evening . Respirations unlabored /ambulatory .

## 2024-01-20 NOTE — ED Triage Notes (Signed)
 Pt states the cold is aggravating his neuropathy. Pt was recently discharged.

## 2024-01-20 NOTE — ED Provider Notes (Signed)
 [facility]Y DEPT [facility] Emergency Department Provider Note MRN:  [id number]  Arrival date & time: 01/20/24     Chief Complaint   Peripheral Neuropathy   History of Present Illness   David Wyatt is a 63 y.o. year-old male with a history of substance use disorder presenting to the ED with chief complaint of neuropathy.  The cold weather is causing his neuropathy to flareup.  Aches and pains in the arms and legs.  Review of Systems  A thorough review of systems was obtained and all systems are negative except as noted in the HPI and PMH.   Patient's Health History    Past Medical History:  Diagnosis Date   Angioedema of lips 07/28/2012   left upper (07/29/2012)   Arthritis    hands and back   Chronic back pain    Cocaine abuse (HCC)    Diabetic neuropathy (HCC)    High cholesterol    Hypertension    Neuropathy    Type II diabetes mellitus (HCC)     Past Surgical History:  Procedure Laterality Date   BACK SURGERY     CYSTOSCOPY W/ URETERAL STENT PLACEMENT Right 02/23/2021   Procedure: CYSTOSCOPY WITH RETROGRADE PYELOGRAM/URETERAL STENT PLACEMENT;  Surgeon: Selma Donnice SAUNDERS, MD;  Location: [facility];  Service: Urology;  Laterality: Right;   CYSTOSCOPY/URETEROSCOPY/HOLMIUM LASER/STENT PLACEMENT Right 12/05/2022   Procedure: CYSTOSCOPY, RIGHT URETEROSCOPY, AND RIGHT URETERAL STENT PLACEMENT;  Surgeon: Shane Steffan BROCKS, MD;  Location: [facility];  Service: Urology;  Laterality: Right;  90 MINUTES   HERNIA REPAIR Right 04/01/2012   I & D EXTREMITY Left 12/13/2012   Procedure: IRRIGATION AND DEBRIDEMENT LEFT THUMB;  Surgeon: Franky SAUNDERS Curia, MD;  Location: [facility] OR;  Service: Orthopedics;  Laterality: Left;   INCISION AND DRAINAGE OF WOUND     boil on back/notes 07/14/2008 (07/29/2012)   INGUINAL HERNIA REPAIR  04/01/2012   Procedure: HERNIA REPAIR INGUINAL ADULT;  Surgeon: Sherlean JINNY Laughter, MD;  Location: [facility] OR;  Service: General;  Laterality: Right;   INGUINAL HERNIA REPAIR Left  09/06/2016   Procedure: OPEN REPAIR LEFT INGUINAL HERNIA;  Surgeon: Tanda Locus, MD;  Location: [facility] OR;  Service: General;  Laterality: Left;   INSERTION OF MESH Left 09/06/2016   Procedure: INSERTION OF MESH;  Surgeon: Tanda Locus, MD;  Location: [facility] OR;  Service: General;  Laterality: Left;   TONSILLECTOMY      Family History  Problem Relation Age of Onset   Diabetes Mother    Kidney disease Mother    Hyperlipidemia Mother    Hyperlipidemia Father    Diabetes Father     Social History   Socioeconomic History   Marital status: Divorced    Spouse name: Not on file   Number of children: 3   Years of education: 12   Highest education level: Not on file  Occupational History    Comment: disabled  Tobacco Use   Smoking status: Former    Current packs/day: 0.25    Average packs/day: 0.3 packs/day for 30.0 years (7.5 ttl pk-yrs)    Types: Cigarettes   Smokeless tobacco: Former    Quit date: 08/15/2015   Tobacco comments:    04/29/16 2  cigs daily, 10/27/17 sometimes < .25 PPD  Vaping Use   Vaping status: Never Used  Substance and Sexual Activity   Alcohol use: Not Currently   Drug use: Not Currently   Sexual activity: Not Currently  Other Topics Concern   Not on file  Social History Narrative   04/29/17   Lives in shelter   Caffeine- a lot of  tea, coffee   Social Drivers of Health   Financial Resource Strain: High Risk (01/15/2024)   Overall Financial Resource Strain (CARDIA)    Difficulty of Paying Living Expenses: Very hard  Food Insecurity: Food Insecurity Present (01/15/2024)   Hunger Vital Sign    Worried About Programme Researcher, Broadcasting/film/video in the Last Year: Often true    Ran Out of Food in the Last Year: Often true  Transportation Needs: Unmet Transportation Needs (01/15/2024)   PRAPARE - Administrator, Civil Service (Medical): Yes    Lack of Transportation (Non-Medical): Yes  Physical Activity: Not on file  Stress: Not on file  Social Connections: Unknown  (10/24/2023)   Social Connection and Isolation Panel    Frequency of Communication with Friends and Family: Once a week    Frequency of Social Gatherings with Friends and Family: Never    Attends Religious Services: Never    Database Administrator or Organizations: No    Attends Banker Meetings: Never    Marital Status: Patient unable to answer  Intimate Partner Violence: Not At Risk (12/07/2023)   Humiliation, Afraid, Rape, and Kick questionnaire    Fear of Current or Ex-Partner: No    Emotionally Abused: No    Physically Abused: No    Sexually Abused: No     Physical Exam   Vitals:   01/20/24 0514 01/20/24 0609  BP: (!) 181/122   Pulse: 71   Resp: 16   Temp: 98.1 F (36.7 C)   SpO2: 99% 100%    CONSTITUTIONAL: Chronically ill-appearing, NAD NEURO/PSYCH:  Alert and oriented x 3, no focal deficits EYES:  eyes equal and reactive ENT/NECK:  no LAD, no JVD CARDIO: Regular rate, well-perfused, normal S1 and S2 PULM:  CTAB no wheezing or rhonchi GI/GU:  non-distended, non-tender MSK/SPINE:  No gross deformities, no edema SKIN:  no rash, atraumatic   *Additional and/or pertinent findings included in MDM below  Diagnostic and Interventional Summary    EKG Interpretation Date/Time:    Ventricular Rate:    PR Interval:    QRS Duration:    QT Interval:    QTC Calculation:   R Axis:      Text Interpretation:         Labs Reviewed - No data to display  No orders to display    Medications - No data to display   Procedures  /  Critical Care Procedures  ED Course and Medical Decision Making  Initial Impression and Ddx Frequent ED visitor here with chronic problem.  Was seen here less than 12 hours ago with similar complaints.  Reassuring vital signs, sleeping peacefully, wakes easily, extremities are neurovascularly intact.  Homeless, no signs of frostbite or other concerns.  Past medical/surgical history that increases complexity of ED encounter:  Diabetes  Interpretation of Diagnostics Laboratory and/or imaging options to aid in the diagnosis/care of the patient were considered.  After careful history and physical examination, it was determined that there was no indication for diagnostics at this time.  Patient Reassessment and Ultimate Disposition/Management     Discharge  Patient management required discussion with the following services or consulting groups:  None  Complexity of Problems Addressed Acute complicated illness or Injury  Additional Data Reviewed and Analyzed Further history obtained from: None  Additional Factors Impacting ED Encounter Risk None  Ozell HERO. Adolphus Hanf,  MD [facility] Emergency Medicine [facility] [email or web address]  Final Clinical Impressions(s) / ED Diagnoses     ICD-10-CM   1. Neuropathy  G62.9       ED Discharge Orders     None        Discharge Instructions Discussed with and Provided to Patient:    Discharge Instructions      You were evaluated in the Emergency Department and after careful evaluation, we did not find any emergent condition requiring admission or further testing in the hospital.  Your exam/testing today is overall reassuring.  Use your home medications and follow-up with your primary care doctor.  Please return to the Emergency Department if you experience any worsening of your condition.   Thank you for allowing us  to be a part of your care.      Theadore Ozell HERO, MD 01/20/24 [phone]
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
## 2024-01-20 NOTE — Discharge Instructions (Signed)
 You were seen for your high blood sugar in the emergency department.   At home, please take your insulin  as prescribed.    Check your MyChart online for the results of any tests that had not resulted by the time you left the emergency department.   Follow-up with your primary doctor in 2-3 days regarding your visit.    Return immediately to the emergency department if you experience any of the following: Increased thirst, urination, high blood sugars, or any other concerning symptoms.    Thank you for visiting our Emergency Department. It was a pleasure taking care of you today.
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
## 2024-01-21 NOTE — Discharge Instructions (Addendum)
Follow-up with your PCP for medication refills

## 2024-01-22 ENCOUNTER — Emergency Department (HOSPITAL_COMMUNITY)
Admission: EM | Admit: 2024-01-22 | Discharge: 2024-01-22 | Disposition: A | Discharge status: Home / Self Care | Payer: MEDICAID | Attending: Emergency Medicine | Admitting: Emergency Medicine

## 2024-01-22 ENCOUNTER — Emergency Department (HOSPITAL_COMMUNITY)
Admission: EM | Admit: 2024-01-22 | Discharge: 2024-01-23 | Disposition: A | Discharge status: Home / Self Care | Payer: MEDICAID | Source: Home / Self Care | Attending: Emergency Medicine | Admitting: Emergency Medicine

## 2024-01-22 ENCOUNTER — Other Ambulatory Visit: Payer: Self-pay

## 2024-01-22 ENCOUNTER — Encounter (HOSPITAL_COMMUNITY): Payer: Self-pay

## 2024-01-22 DIAGNOSIS — Z79899 Other long term (current) drug therapy: Secondary | ICD-10-CM | POA: Diagnosis not present

## 2024-01-22 DIAGNOSIS — Z794 Long term (current) use of insulin: Secondary | ICD-10-CM | POA: Diagnosis not present

## 2024-01-22 DIAGNOSIS — Z7984 Long term (current) use of oral hypoglycemic drugs: Secondary | ICD-10-CM | POA: Diagnosis not present

## 2024-01-22 DIAGNOSIS — E1165 Type 2 diabetes mellitus with hyperglycemia: Secondary | ICD-10-CM | POA: Insufficient documentation

## 2024-01-22 DIAGNOSIS — E1140 Type 2 diabetes mellitus with diabetic neuropathy, unspecified: Secondary | ICD-10-CM | POA: Insufficient documentation

## 2024-01-22 DIAGNOSIS — E871 Hypo-osmolality and hyponatremia: Secondary | ICD-10-CM | POA: Diagnosis not present

## 2024-01-22 DIAGNOSIS — R739 Hyperglycemia, unspecified: Secondary | ICD-10-CM

## 2024-01-22 DIAGNOSIS — I10 Essential (primary) hypertension: Secondary | ICD-10-CM | POA: Diagnosis not present

## 2024-01-22 LAB — URINALYSIS, ROUTINE W REFLEX MICROSCOPIC
Bacteria, UA: NONE SEEN
Bacteria, UA: NONE SEEN
Bilirubin Urine: NEGATIVE
Bilirubin Urine: NEGATIVE
Glucose, UA: >=500 mg/dL — AB
Glucose, UA: >=500 mg/dL — AB
Hgb urine dipstick: NEGATIVE
Hgb urine dipstick: NEGATIVE
Ketones, ur: NEGATIVE mg/dL
Ketones, ur: NEGATIVE mg/dL
Leukocytes,Ua: NEGATIVE
Leukocytes,Ua: NEGATIVE
Nitrite: NEGATIVE
Nitrite: NEGATIVE
Protein, ur: NEGATIVE mg/dL
Protein, ur: NEGATIVE mg/dL
Specific Gravity, Urine: 1.027 (ref 1.005–1.030)
Specific Gravity, Urine: 1.026 (ref 1.005–1.030)
pH: 6 (ref 5.0–8.0)
pH: 5 (ref 5.0–8.0)

## 2024-01-22 LAB — BETA-HYDROXYBUTYRIC ACID: Beta-Hydroxybutyric Acid: 0.16 mmol/L (ref 0.05–0.27)

## 2024-01-22 LAB — CBC
HCT: 41.8 % (ref 39.0–52.0)
HCT: 39.1 % (ref 39.0–52.0)
Hemoglobin: 13.9 g/dL (ref 13.0–17.0)
Hemoglobin: 13 g/dL (ref 13.0–17.0)
MCH: 30.6 pg (ref 26.0–34.0)
MCH: 31.3 pg (ref 26.0–34.0)
MCHC: 33.3 g/dL (ref 30.0–36.0)
MCHC: 33.2 g/dL (ref 30.0–36.0)
MCV: 92.1 fL (ref 80.0–100.0)
MCV: 94.2 fL (ref 80.0–100.0)
Platelets: 350 K/uL (ref 150–400)
Platelets: 348 K/uL (ref 150–400)
RBC: 4.54 MIL/uL (ref 4.22–5.81)
RBC: 4.15 MIL/uL — ABNORMAL LOW (ref 4.22–5.81)
RDW: 12.8 % (ref 11.5–15.5)
RDW: 12.8 % (ref 11.5–15.5)
WBC: 5.6 K/uL (ref 4.0–10.5)
WBC: 6.8 K/uL (ref 4.0–10.5)
nRBC: 0 % (ref 0.0–0.2)
nRBC: 0 % (ref 0.0–0.2)

## 2024-01-22 LAB — I-STAT CHEM 8, ED
BUN: 21 mg/dL (ref 8–23)
BUN: 17 mg/dL (ref 8–23)
Calcium, Ion: 1.19 mmol/L (ref 1.15–1.40)
Calcium, Ion: 1.1 mmol/L — ABNORMAL LOW (ref 1.15–1.40)
Chloride: 99 mmol/L (ref 98–111)
Chloride: 100 mmol/L (ref 98–111)
Creatinine, Ser: 1.2 mg/dL (ref 0.61–1.24)
Creatinine, Ser: 1.2 mg/dL (ref 0.61–1.24)
Glucose, Bld: >700 mg/dL (ref 70–99)
Glucose, Bld: 458 mg/dL — ABNORMAL HIGH (ref 70–99)
HCT: 44 % (ref 39.0–52.0)
HCT: 40 % (ref 39.0–52.0)
Hemoglobin: 15 g/dL (ref 13.0–17.0)
Hemoglobin: 13.6 g/dL (ref 13.0–17.0)
Potassium: 4.8 mmol/L (ref 3.5–5.1)
Potassium: 4 mmol/L (ref 3.5–5.1)
Sodium: 135 mmol/L (ref 135–145)
Sodium: 137 mmol/L (ref 135–145)
TCO2: 24 mmol/L (ref 22–32)
TCO2: 24 mmol/L (ref 22–32)

## 2024-01-22 LAB — COMPREHENSIVE METABOLIC PANEL WITH GFR
ALT: 81 U/L — ABNORMAL HIGH (ref 0–44)
AST: 78 U/L — ABNORMAL HIGH (ref 15–41)
Albumin: 3.6 g/dL (ref 3.5–5.0)
Alkaline Phosphatase: 143 U/L — ABNORMAL HIGH (ref 38–126)
Anion gap: 10 (ref 5–15)
BUN: 18 mg/dL (ref 8–23)
CO2: 22 mmol/L (ref 22–32)
Calcium: 9.2 mg/dL (ref 8.9–10.3)
Chloride: 102 mmol/L (ref 98–111)
Creatinine, Ser: 1.3 mg/dL — ABNORMAL HIGH (ref 0.61–1.24)
GFR, Estimated: >60 mL/min (ref >60)
Glucose, Bld: 711 mg/dL (ref 70–99)
Potassium: 4.9 mmol/L (ref 3.5–5.1)
Sodium: 134 mmol/L — ABNORMAL LOW (ref 135–145)
Total Bilirubin: 1 mg/dL (ref 0.0–1.2)
Total Protein: 7.8 g/dL (ref 6.5–8.1)

## 2024-01-22 LAB — CBG MONITORING, ED
Glucose-Capillary: >600 mg/dL (ref 70–99)
Glucose-Capillary: 554 mg/dL (ref 70–99)
Glucose-Capillary: 114 mg/dL — ABNORMAL HIGH (ref 70–99)
Glucose-Capillary: 383 mg/dL — ABNORMAL HIGH (ref 70–99)

## 2024-01-22 MED ORDER — LACTATED RINGERS IV BOLUS
1000.0000 mL | Freq: Once | INTRAVENOUS | Status: AC
  Administered 2024-01-22: 1000 mL via INTRAVENOUS

## 2024-01-22 MED ORDER — ACETAMINOPHEN 500 MG PO TABS
1000.0000 mg | ORAL_TABLET | Freq: Once | ORAL | Status: AC
  Administered 2024-01-22: 1000 mg via ORAL
  Filled 2024-01-22: qty 2

## 2024-01-22 MED ORDER — INSULIN ASPART 100 UNIT/ML IJ SOLN
15.0000 [IU] | Freq: Once | INTRAMUSCULAR | Status: AC
  Administered 2024-01-22: 15 [IU] via SUBCUTANEOUS
  Filled 2024-01-22: qty 15

## 2024-01-22 NOTE — ED Provider Triage Note (Signed)
 Emergency Medicine Provider Triage Evaluation Note  David Wyatt , a 63 y.o. male  was evaluated in triage.  Pt complains of hyperglycemia no new complaints.  Review of Systems  Positive: hyperglycemia Negative: fever  Physical Exam  BP (!) 156/86 (BP Location: Right Arm)   Pulse 73   Temp 98.4 F (36.9 C)   Resp 18   Ht 5' 8 (1.727 m)   Wt 68 kg   SpO2 98%   BMI 22.81 kg/m  Gen:   Awake, no distress   Resp:  Normal effort  MSK:   Moves extremities without difficulty  Other:    Medical Decision Making  Medically screening exam initiated at 7:32 PM.  Appropriate orders placed.  David Wyatt was informed that the remainder of the evaluation will be completed by another provider, this initial triage assessment does not replace that evaluation, and the importance of remaining in the ED until their evaluation is complete.     David Chroman, PA-C 01/22/24 8066

## 2024-01-22 NOTE — ED Triage Notes (Signed)
 Pt BIB [facility] for hyperglycemia at 303. Freq urination, thirsty.

## 2024-01-22 NOTE — ED Notes (Signed)
 Cm 8 to josh n.rn by at
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
## 2024-01-22 NOTE — ED Triage Notes (Signed)
 Pt coming in today reporting that his blood sugar is high. Pt reports that his blood sugar was high and he was just discharged from the emergency department.

## 2024-01-22 NOTE — ED Notes (Signed)
 Patient is asking everyone for water was he has been told not until his blood work is done. Patient has been drinking out of the sink in the room. He has been told not to drink out of the sink and sit down do to not walk steady. Patient has not ben using his walker to get from the chair to the sink.

## 2024-01-22 NOTE — ED Notes (Signed)
 Patient had to pee as soon as he got her patient was given a urinal

## 2024-01-22 NOTE — ED Notes (Signed)
No response for vitals  

## 2024-01-22 NOTE — ED Provider Triage Note (Signed)
 Emergency Medicine Provider Triage Evaluation Note  David Wyatt , a 63 y.o. male  was evaluated in triage.  Pt complains of generalized weakness, polyuria, polydipsia.  Review of Systems  Positive: See above Negative: Abdominal pain  Physical Exam  BP 136/85 (BP Location: Left Arm)   Pulse 78   Temp 98.6 F (37 C) (Oral)   Resp 17   SpO2 97%  Gen:   Awake, no distress   Resp:  Normal effort  MSK:   Moves extremities without difficulty    Medical Decision Making  Medically screening exam initiated at 10:31 AM.  Appropriate orders placed.  David Wyatt was informed that the remainder of the evaluation will be completed by another provider, this initial triage assessment does not replace that evaluation, and the importance of remaining in the ED until their evaluation is complete.     David Prentice SAUNDERS, MD 01/22/24 1031

## 2024-01-22 NOTE — Care Management (Signed)
 Transition of Care [facility]) - Emergency Department Mini Assessment   Patient Details  Name: David Wyatt MRN: [id number] Date of Birth: Apr 03, 1960  Transition of Care [facility]) CM/SW Contact:    Corean JAYSON Canary, RN Phone Number: 01/22/2024, 4:44 PM   Clinical Narrative:  Consult regarding medication  assistance. The patient has insurance, therefore he is ineligible for [facility] medication assistance.  Providers made aware.    ED Mini Assessment:             Interventions which prevented an admission or readmission: Medication Review    Patient Contact and Communications        ,                 Admission diagnosis:  weakness Patient Active Problem List   Diagnosis Date Noted   Depressive disorder 12/05/2023   Suicidal ideation 12/02/2023   Deficient knowledge of community resources 12/02/2023   Nausea vomiting and diarrhea 11/27/2023   Abdominal pain 11/27/2023   Chronic health problem 10/22/2023   AKI (acute kidney injury) 10/18/2023   Hyperglycemia 10/18/2023   Hyperglycemia due to diabetes mellitus (HCC) 09/27/2023   Malingering 07/15/2023   MDD (major depressive disorder), recurrent episode, severe (HCC) 05/19/2023   MDD (major depressive disorder), recurrent episode, mild 07/16/2022   MDD (major depressive disorder), recurrent severe, 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   Substance induced mood disorder (HCC) 06/18/2022   Homelessness 06/18/2022   Inadequate community resources 06/18/2022   Inability to access community resources due to transportation insecurity 06/18/2022   Cocaine use disorder (HCC) 03/03/2022   Cocaine use disorder, severe, dependence (HCC) 01/21/2022   Suicidal behavior 01/20/2022   Acute respiratory failure with hypoxia (HCC) 10/13/2021   History of cocaine use 07/23/2021   Chronic right shoulder pain 07/23/2021   Renal stones 07/23/2021   Dermoid cyst of skin of back 07/23/2021   Uncontrolled type 2 diabetes mellitus with  hyperglycemia (HCC) 03/22/2021   Left kidney mass 03/22/2021   Hydronephrosis of right kidney 02/23/2021   Type 2 diabetes mellitus with hyperglycemia (HCC) 02/23/2021   Tobacco abuse 08/19/2018   Hyperlipidemia 04/24/2017   GERD (gastroesophageal reflux disease) 06/13/2015   Diabetic neuropathy (HCC) 05/01/2015   Essential hypertension, benign 11/01/2013   Lumbar disc herniation 09/13/2013   Constipation 09/13/2013   Chronic back pain 09/13/2013   Felon of finger of left hand with lymphangitis 12/13/2012   Crack cocaine use 07/29/2012   Arthritis 07/29/2012   Weakness generalized 07/29/2012   PCP:  Delbert Clam, MD Pharmacy:   [facility] - [facility] [address] [place] [address] Phone: [phone] Fax: [phone]  [facility] [address] [place] [address] Phone: [phone] Fax: [phone]  [id number] [place], [place] - [address] [facility] [address] [place] [address] Phone: [phone] Fax: [phone]

## 2024-01-22 NOTE — ED Notes (Signed)
Pt was given discharge instruction and verbalized understanding

## 2024-01-22 NOTE — ED Notes (Signed)
 Patient given 1/2 cup ice approved by MD

## 2024-01-22 NOTE — Discharge Instructions (Addendum)
 David Wyatt:  Thank you for allowing us  to take care of you today.  We hope you begin feeling better soon. You were seen today for high blood sugar.  While you were here you received insulin , fluids, meals.  Your blood sugars and resolved with there were no signs of diabetic ketoacidosis, infection, electrolyte abnormalities.  Please go to your PCP tomorrow to inquire about insulin  and medication  To-Do:  Please follow-up with your primary doctor within the next 2-3 days. It is important that you review any labs or imaging results (if any) that you had today with them. Your preliminary imaging results (if any) are attached. Please return to the Emergency Department or call 911 if you experience chest pain, shortness of breath, severe pain, severe fever, altered mental status, or have any reason to think that you need emergency medical care.  Thank you again.  Hope you feel better soon.  Department of Emergency Medicine

## 2024-01-23 ENCOUNTER — Other Ambulatory Visit: Payer: Self-pay

## 2024-01-23 ENCOUNTER — Encounter (HOSPITAL_COMMUNITY): Payer: Self-pay | Admitting: Emergency Medicine

## 2024-01-23 ENCOUNTER — Observation Stay (HOSPITAL_COMMUNITY)
Admission: EM | Admit: 2024-01-23 | Discharge: 2024-01-24 | Disposition: A | Discharge status: Home / Self Care | Payer: MEDICAID | Attending: Emergency Medicine | Admitting: Emergency Medicine

## 2024-01-23 DIAGNOSIS — I10 Essential (primary) hypertension: Secondary | ICD-10-CM | POA: Insufficient documentation

## 2024-01-23 DIAGNOSIS — F1920 Other psychoactive substance dependence, uncomplicated: Secondary | ICD-10-CM | POA: Diagnosis not present

## 2024-01-23 DIAGNOSIS — R443 Hallucinations, unspecified: Secondary | ICD-10-CM

## 2024-01-23 DIAGNOSIS — E1140 Type 2 diabetes mellitus with diabetic neuropathy, unspecified: Secondary | ICD-10-CM | POA: Diagnosis not present

## 2024-01-23 DIAGNOSIS — E1100 Type 2 diabetes mellitus with hyperosmolarity without nonketotic hyperglycemic-hyperosmolar coma (NKHHC): Secondary | ICD-10-CM | POA: Diagnosis not present

## 2024-01-23 DIAGNOSIS — Z5900 Homelessness unspecified: Secondary | ICD-10-CM | POA: Insufficient documentation

## 2024-01-23 DIAGNOSIS — Z794 Long term (current) use of insulin: Secondary | ICD-10-CM | POA: Diagnosis not present

## 2024-01-23 DIAGNOSIS — F600 Paranoid personality disorder: Secondary | ICD-10-CM | POA: Insufficient documentation

## 2024-01-23 DIAGNOSIS — Z79899 Other long term (current) drug therapy: Secondary | ICD-10-CM | POA: Diagnosis not present

## 2024-01-23 DIAGNOSIS — R440 Auditory hallucinations: Secondary | ICD-10-CM

## 2024-01-23 DIAGNOSIS — R45851 Suicidal ideations: Secondary | ICD-10-CM | POA: Diagnosis present

## 2024-01-23 DIAGNOSIS — F32A Depression, unspecified: Secondary | ICD-10-CM | POA: Diagnosis not present

## 2024-01-23 DIAGNOSIS — R739 Hyperglycemia, unspecified: Secondary | ICD-10-CM

## 2024-01-23 DIAGNOSIS — F22 Delusional disorders: Secondary | ICD-10-CM

## 2024-01-23 DIAGNOSIS — Z87891 Personal history of nicotine dependence: Secondary | ICD-10-CM | POA: Insufficient documentation

## 2024-01-23 DIAGNOSIS — E785 Hyperlipidemia, unspecified: Secondary | ICD-10-CM | POA: Diagnosis not present

## 2024-01-23 DIAGNOSIS — E7800 Pure hypercholesterolemia, unspecified: Secondary | ICD-10-CM

## 2024-01-23 DIAGNOSIS — E1142 Type 2 diabetes mellitus with diabetic polyneuropathy: Secondary | ICD-10-CM

## 2024-01-23 DIAGNOSIS — Z59819 Housing instability, housed unspecified: Secondary | ICD-10-CM

## 2024-01-23 DIAGNOSIS — F1910 Other psychoactive substance abuse, uncomplicated: Secondary | ICD-10-CM | POA: Diagnosis present

## 2024-01-23 HISTORY — DX: Auditory hallucinations: R44.0

## 2024-01-23 LAB — I-STAT VENOUS BLOOD GAS, ED
Acid-base deficit: 1 mmol/L (ref 0.0–2.0)
Bicarbonate: 24.5 mmol/L (ref 20.0–28.0)
Calcium, Ion: 1.14 mmol/L — ABNORMAL LOW (ref 1.15–1.40)
HCT: 39 % (ref 39.0–52.0)
Hemoglobin: 13.3 g/dL (ref 13.0–17.0)
O2 Saturation: 79 %
Potassium: 4.4 mmol/L (ref 3.5–5.1)
Sodium: 134 mmol/L — ABNORMAL LOW (ref 135–145)
TCO2: 26 mmol/L (ref 22–32)
pCO2, Ven: 45.1 mmHg (ref 44–60)
pH, Ven: 7.343 (ref 7.25–7.43)
pO2, Ven: 46 mmHg — ABNORMAL HIGH (ref 32–45)

## 2024-01-23 LAB — GLUCOSE, CAPILLARY
Glucose-Capillary: 390 mg/dL — ABNORMAL HIGH (ref 70–99)
Glucose-Capillary: 254 mg/dL — ABNORMAL HIGH (ref 70–99)

## 2024-01-23 LAB — CBC
HCT: 39.9 % (ref 39.0–52.0)
Hemoglobin: 12.9 g/dL — ABNORMAL LOW (ref 13.0–17.0)
MCH: 30.4 pg (ref 26.0–34.0)
MCHC: 32.3 g/dL (ref 30.0–36.0)
MCV: 93.9 fL (ref 80.0–100.0)
Platelets: 321 K/uL (ref 150–400)
RBC: 4.25 MIL/uL (ref 4.22–5.81)
RDW: 12.9 % (ref 11.5–15.5)
WBC: 5.1 K/uL (ref 4.0–10.5)
nRBC: 0 % (ref 0.0–0.2)

## 2024-01-23 LAB — BASIC METABOLIC PANEL WITH GFR
Anion gap: 12 (ref 5–15)
Anion gap: 8 (ref 5–15)
Anion gap: 8 (ref 5–15)
Anion gap: 9 (ref 5–15)
BUN: 14 mg/dL (ref 8–23)
BUN: 13 mg/dL (ref 8–23)
BUN: 14 mg/dL (ref 8–23)
BUN: 15 mg/dL (ref 8–23)
CO2: 22 mmol/L (ref 22–32)
CO2: 26 mmol/L (ref 22–32)
CO2: 26 mmol/L (ref 22–32)
CO2: 24 mmol/L (ref 22–32)
Calcium: 8.8 mg/dL — ABNORMAL LOW (ref 8.9–10.3)
Calcium: 9.1 mg/dL (ref 8.9–10.3)
Calcium: 8.4 mg/dL — ABNORMAL LOW (ref 8.9–10.3)
Calcium: 8.6 mg/dL — ABNORMAL LOW (ref 8.9–10.3)
Chloride: 98 mmol/L (ref 98–111)
Chloride: 104 mmol/L (ref 98–111)
Chloride: 100 mmol/L (ref 98–111)
Chloride: 104 mmol/L (ref 98–111)
Creatinine, Ser: 1.28 mg/dL — ABNORMAL HIGH (ref 0.61–1.24)
Creatinine, Ser: 1.1 mg/dL (ref 0.61–1.24)
Creatinine, Ser: 1.11 mg/dL (ref 0.61–1.24)
Creatinine, Ser: 1.09 mg/dL (ref 0.61–1.24)
GFR, Estimated: >60 mL/min (ref >60)
GFR, Estimated: >60 mL/min (ref >60)
GFR, Estimated: >60 mL/min (ref >60)
GFR, Estimated: >60 mL/min (ref >60)
Glucose, Bld: 664 mg/dL (ref 70–99)
Glucose, Bld: 95 mg/dL (ref 70–99)
Glucose, Bld: 407 mg/dL — ABNORMAL HIGH (ref 70–99)
Glucose, Bld: 223 mg/dL — ABNORMAL HIGH (ref 70–99)
Potassium: 4.9 mmol/L (ref 3.5–5.1)
Potassium: 3.9 mmol/L (ref 3.5–5.1)
Potassium: 4.1 mmol/L (ref 3.5–5.1)
Potassium: 3.9 mmol/L (ref 3.5–5.1)
Sodium: 132 mmol/L — ABNORMAL LOW (ref 135–145)
Sodium: 138 mmol/L (ref 135–145)
Sodium: 134 mmol/L — ABNORMAL LOW (ref 135–145)
Sodium: 137 mmol/L (ref 135–145)

## 2024-01-23 LAB — COMPREHENSIVE METABOLIC PANEL WITH GFR
ALT: 58 U/L — ABNORMAL HIGH (ref 0–44)
AST: 35 U/L (ref 15–41)
Albumin: 3.1 g/dL — ABNORMAL LOW (ref 3.5–5.0)
Alkaline Phosphatase: 103 U/L (ref 38–126)
Anion gap: 11 (ref 5–15)
BUN: 14 mg/dL (ref 8–23)
CO2: 24 mmol/L (ref 22–32)
Calcium: 9 mg/dL (ref 8.9–10.3)
Chloride: 99 mmol/L (ref 98–111)
Creatinine, Ser: 1.26 mg/dL — ABNORMAL HIGH (ref 0.61–1.24)
GFR, Estimated: >60 mL/min (ref >60)
Glucose, Bld: 662 mg/dL (ref 70–99)
Potassium: 4.6 mmol/L (ref 3.5–5.1)
Sodium: 134 mmol/L — ABNORMAL LOW (ref 135–145)
Total Bilirubin: 1.1 mg/dL (ref 0.0–1.2)
Total Protein: 6.9 g/dL (ref 6.5–8.1)

## 2024-01-23 LAB — RAPID URINE DRUG SCREEN, HOSP PERFORMED
Amphetamines: NOT DETECTED
Barbiturates: NOT DETECTED
Benzodiazepines: NOT DETECTED
Cocaine: POSITIVE — AB
Opiates: NOT DETECTED
Tetrahydrocannabinol: NOT DETECTED

## 2024-01-23 LAB — URINALYSIS, W/ REFLEX TO CULTURE (INFECTION SUSPECTED)
Bacteria, UA: NONE SEEN
Bilirubin Urine: NEGATIVE
Glucose, UA: >=500 mg/dL — AB
Hgb urine dipstick: NEGATIVE
Ketones, ur: NEGATIVE mg/dL
Leukocytes,Ua: NEGATIVE
Nitrite: NEGATIVE
Protein, ur: NEGATIVE mg/dL
Specific Gravity, Urine: 1.023 (ref 1.005–1.030)
pH: 7 (ref 5.0–8.0)

## 2024-01-23 LAB — CBG MONITORING, ED
Glucose-Capillary: 349 mg/dL — ABNORMAL HIGH (ref 70–99)
Glucose-Capillary: 331 mg/dL — ABNORMAL HIGH (ref 70–99)
Glucose-Capillary: 534 mg/dL (ref 70–99)
Glucose-Capillary: 567 mg/dL (ref 70–99)
Glucose-Capillary: 115 mg/dL — ABNORMAL HIGH (ref 70–99)
Glucose-Capillary: 251 mg/dL — ABNORMAL HIGH (ref 70–99)

## 2024-01-23 LAB — BETA-HYDROXYBUTYRIC ACID: Beta-Hydroxybutyric Acid: 0.12 mmol/L (ref 0.05–0.27)

## 2024-01-23 LAB — OSMOLALITY: Osmolality: 317 mosm/kg — ABNORMAL HIGH (ref 275–295)

## 2024-01-23 LAB — ETHANOL: Alcohol, Ethyl (B): <15 mg/dL (ref <15)

## 2024-01-23 MED ORDER — THIAMINE MONONITRATE 100 MG PO TABS
100.0000 mg | ORAL_TABLET | Freq: Every day | ORAL | Status: DC
  Administered 2024-01-23 – 2024-01-24 (×2): 100 mg via ORAL
  Filled 2024-01-23 (×2): qty 1

## 2024-01-23 MED ORDER — ACETAMINOPHEN 650 MG RE SUPP
650.0000 mg | Freq: Four times a day (QID) | RECTAL | Status: DC | PRN
Start: 2024-01-23 — End: 2024-01-24

## 2024-01-23 MED ORDER — ATORVASTATIN CALCIUM 40 MG PO TABS
40.0000 mg | ORAL_TABLET | Freq: Every day | ORAL | Status: DC
  Administered 2024-01-23 – 2024-01-24 (×2): 40 mg via ORAL
  Filled 2024-01-23 (×2): qty 1

## 2024-01-23 MED ORDER — LORAZEPAM 2 MG/ML IJ SOLN: 1.0000 mg | INTRAMUSCULAR | Status: DC | PRN

## 2024-01-23 MED ORDER — INSULIN ASPART 100 UNIT/ML IJ SOLN
0.0000 [IU] | Freq: Every day | INTRAMUSCULAR | Status: DC
  Administered 2024-01-23: 3 [IU] via SUBCUTANEOUS
  Filled 2024-01-23: qty 3

## 2024-01-23 MED ORDER — THIAMINE HCL 100 MG/ML IJ SOLN
100.0000 mg | Freq: Every day | INTRAMUSCULAR | Status: DC
  Filled 2024-01-23: qty 2

## 2024-01-23 MED ORDER — LACTATED RINGERS IV BOLUS
20.0000 mL/kg | Freq: Once | INTRAVENOUS | Status: AC
  Administered 2024-01-23: 1360 mL via INTRAVENOUS

## 2024-01-23 MED ORDER — SODIUM CHLORIDE 0.9% FLUSH
3.0000 mL | Freq: Two times a day (BID) | INTRAVENOUS | Status: DC
  Administered 2024-01-23 – 2024-01-24 (×2): 3 mL via INTRAVENOUS

## 2024-01-23 MED ORDER — TRAZODONE HCL 50 MG PO TABS
50.0000 mg | ORAL_TABLET | Freq: Every evening | ORAL | Status: DC | PRN
Start: 2024-01-23 — End: 2024-01-24
  Administered 2024-01-23: 50 mg via ORAL
  Filled 2024-01-23: qty 1

## 2024-01-23 MED ORDER — LORAZEPAM 1 MG PO TABS: 1.0000 mg | ORAL_TABLET | ORAL | Status: DC | PRN

## 2024-01-23 MED ORDER — INSULIN GLARGINE-YFGN 100 UNIT/ML ~~LOC~~ SOLN
15.0000 [IU] | Freq: Every day | SUBCUTANEOUS | Status: DC
  Administered 2024-01-23 – 2024-01-24 (×2): 15 [IU] via SUBCUTANEOUS
  Filled 2024-01-23 (×2): qty 0.15

## 2024-01-23 MED ORDER — ACETAMINOPHEN 325 MG PO TABS: 650.0000 mg | ORAL_TABLET | Freq: Four times a day (QID) | ORAL | Status: DC | PRN

## 2024-01-23 MED ORDER — POTASSIUM CHLORIDE 10 MEQ/100ML IV SOLN
10.0000 meq | INTRAVENOUS | Status: AC
  Administered 2024-01-23: 10 meq via INTRAVENOUS
  Filled 2024-01-23: qty 100

## 2024-01-23 MED ORDER — ONDANSETRON HCL 4 MG/2ML IJ SOLN: 4.0000 mg | Freq: Four times a day (QID) | INTRAMUSCULAR | Status: DC | PRN

## 2024-01-23 MED ORDER — SODIUM CHLORIDE 0.9 % IV SOLN: INTRAVENOUS | Status: AC

## 2024-01-23 MED ORDER — FOLIC ACID 1 MG PO TABS
1.0000 mg | ORAL_TABLET | Freq: Every day | ORAL | Status: DC
  Administered 2024-01-23 – 2024-01-24 (×2): 1 mg via ORAL
  Filled 2024-01-23 (×2): qty 1

## 2024-01-23 MED ORDER — DEXTROSE 50 % IV SOLN: 0.0000 mL | INTRAVENOUS | Status: DC | PRN

## 2024-01-23 MED ORDER — NICOTINE 21 MG/24HR TD PT24
21.0000 mg | MEDICATED_PATCH | Freq: Every day | TRANSDERMAL | Status: DC
  Filled 2024-01-23 (×2): qty 1

## 2024-01-23 MED ORDER — GABAPENTIN 300 MG PO CAPS
300.0000 mg | ORAL_CAPSULE | Freq: Three times a day (TID) | ORAL | Status: DC
Start: 2024-01-23 — End: 2024-01-24
  Administered 2024-01-23 – 2024-01-24 (×2): 300 mg via ORAL
  Filled 2024-01-23 (×2): qty 1

## 2024-01-23 MED ORDER — INSULIN ASPART 100 UNIT/ML IJ SOLN
0.0000 [IU] | Freq: Three times a day (TID) | INTRAMUSCULAR | Status: DC
  Administered 2024-01-23: 15 [IU] via SUBCUTANEOUS
  Administered 2024-01-24 (×2): 8 [IU] via SUBCUTANEOUS
  Filled 2024-01-23: qty 15
  Filled 2024-01-23 (×2): qty 8

## 2024-01-23 MED ORDER — INSULIN REGULAR(HUMAN) IN NACL 100-0.9 UT/100ML-% IV SOLN
INTRAVENOUS | Status: DC
  Administered 2024-01-23: 11.5 [IU]/h via INTRAVENOUS
  Filled 2024-01-23: qty 100

## 2024-01-23 MED ORDER — ENOXAPARIN SODIUM 40 MG/0.4ML IJ SOSY
40.0000 mg | PREFILLED_SYRINGE | INTRAMUSCULAR | Status: DC
  Administered 2024-01-23 – 2024-01-24 (×2): 40 mg via SUBCUTANEOUS
  Filled 2024-01-23 (×2): qty 0.4

## 2024-01-23 MED ORDER — PANTOPRAZOLE SODIUM 40 MG PO TBEC
40.0000 mg | DELAYED_RELEASE_TABLET | Freq: Every day | ORAL | Status: DC
  Administered 2024-01-23 – 2024-01-24 (×2): 40 mg via ORAL
  Filled 2024-01-23 (×2): qty 1

## 2024-01-23 MED ORDER — LACTATED RINGERS IV SOLN: INTRAVENOUS | Status: AC

## 2024-01-23 MED ORDER — ONDANSETRON HCL 4 MG PO TABS: 4.0000 mg | ORAL_TABLET | Freq: Four times a day (QID) | ORAL | Status: DC | PRN

## 2024-01-23 MED ORDER — DEXTROSE IN LACTATED RINGERS 5 % IV SOLN: INTRAVENOUS | Status: DC

## 2024-01-23 MED ORDER — ADULT MULTIVITAMIN W/MINERALS CH
1.0000 | ORAL_TABLET | Freq: Every day | ORAL | Status: DC
  Administered 2024-01-23 – 2024-01-24 (×2): 1 via ORAL
  Filled 2024-01-23 (×2): qty 1

## 2024-01-23 NOTE — ED Triage Notes (Addendum)
 PT states he doesn't feel safe by himself. [place] and feels like he can't get the care he needs. He is endorsing SI r/t his circumstances. He states he is tired of his circumstances and serious about the SI  struggle but does not have a plan and does not want to have a plan.

## 2024-01-23 NOTE — ED Provider Notes (Signed)
 [place] EMERGENCY DEPARTMENT AT [facility] Provider Note   CSN: [id number] Arrival date & time: 01/23/24  9341     Patient presents with: Suicidal   David Wyatt is a 63 y.o. male.   The history is provided by the patient and medical records. No language interpreter was used.  Hyperglycemia Severity:  Severe Onset quality:  Gradual Duration:  1 week Timing:  Constant Progression:  Waxing and waning Chronicity:  Recurrent Context: noncompliance   Relieved by:  Nothing Ineffective treatments:  None tried Associated symptoms: dehydration, fatigue and increased thirst   Associated symptoms: no abdominal pain, no blurred vision, no chest pain, no dizziness, no dysuria, no fever, no malaise, no nausea, no shortness of breath and no vomiting   Mental Health Problem Presenting symptoms: hallucinations, paranoid behavior and suicidal thoughts   Presenting symptoms: no agitation   Context: drug abuse and noncompliance   Treatment compliance:  Untreated Relieved by:  Nothing Worsened by:  Nothing Ineffective treatments:  None tried Associated symptoms: fatigue   Associated symptoms: no abdominal pain, no chest pain and no headaches        Prior to Admission medications   Medication Sig Start Date End Date Taking? Authorizing Provider  Accu-Chek Softclix Lancets lancets Use to check blood sugar three times daily as directed 11/01/23   Shrivastava, Aryendra, MD  atorvastatin  (LIPITOR) 40 MG tablet Take 1 tablet (40 mg total) by mouth daily. 01/19/24   Newlin, Enobong, MD  Blood Glucose Monitoring Suppl (ACCU-CHEK GUIDE ME) w/Device KIT Use to test blood sugar 3 times daily. 11/01/23   Shrivastava, Aryendra, MD  DULoxetine  (CYMBALTA ) 30 MG capsule Take 3 capsules (90 mg total) by mouth daily. Patient not taking: Reported on 01/19/2024 12/13/23   Jadapalle, Sree, MD  gabapentin  (NEURONTIN ) 300 MG capsule Take 1 capsule (300 mg total) by mouth 3 (three) times daily.  12/12/23   Donnelly Mellow, MD  glucose blood (ACCU-CHEK GUIDE TEST) test strip Use as instructed to check blood sugar three times daily Patient taking differently: 1 each by Other route daily at 6 (six) AM. Use as instructed to check blood sugar three times daily 11/01/23   Shrivastava, Aryendra, MD  insulin  aspart (NOVOLOG ) 100 UNIT/ML FlexPen Inject 3 Units into the skin 3 (three) times daily with meals. 12/27/23   Zelaya, Oscar A, PA-C  insulin  glargine (LANTUS ) 100 UNIT/ML Solostar Pen Inject 12 Units into the skin at bedtime. 12/27/23   Zelaya, Oscar A, PA-C  Insulin  Pen Needle 32G X 4 MM MISC Use 1 each to inject insulin  into the skin 4 (four) times daily -  before meals and at bedtime. 12/28/23   Zelaya, Oscar A, PA-C  lidocaine  (LIDODERM ) 5 % Place 1 patch onto the skin daily. Remove & Discard patch within 12 hours or as directed by MD Patient not taking: Reported on 01/19/2024 01/15/24   Kingsley, Victoria K, DO  metFORMIN  (GLUCOPHAGE ) 1000 MG tablet Take 1 tablet (1,000 mg total) by mouth 2 (two) times daily with a meal. 12/12/23   Donnelly Mellow, MD  ondansetron  (ZOFRAN ) 4 MG tablet Take 1 tablet (4 mg total) by mouth every 6 (six) hours. Patient not taking: Reported on 01/19/2024 12/16/23   Robinson, John K, PA-C  pantoprazole  (PROTONIX ) 40 MG tablet Take 1 tablet (40 mg total) by mouth daily. Patient not taking: Reported on 01/19/2024 11/30/23   Rosario Eland I, MD  traZODone  (DESYREL ) 50 MG tablet Take 1 tablet (50 mg total) by mouth at  bedtime as needed for sleep. 12/12/23   Jadapalle, Sree, MD    Allergies: Lisinopril     Review of Systems  Constitutional:  Positive for fatigue. Negative for chills and fever.  HENT:  Negative for congestion.   Eyes:  Negative for blurred vision and visual disturbance.  Respiratory:  Negative for cough, chest tightness, shortness of breath and wheezing.   Cardiovascular:  Negative for chest pain and palpitations.  Gastrointestinal:  Positive for  constipation. Negative for abdominal pain, diarrhea, nausea and vomiting.  Endocrine: Positive for polydipsia.  Genitourinary:  Positive for frequency. Negative for dysuria and flank pain.  Musculoskeletal:  Negative for back pain, neck pain and neck stiffness.  Skin:  Negative for rash and wound.  Neurological:  Negative for dizziness, light-headedness and headaches.  Psychiatric/Behavioral:  Positive for hallucinations, paranoia and suicidal ideas. Negative for agitation.   All other systems reviewed and are negative.   Updated Vital Signs BP (!) 139/101 (BP Location: Right Arm)   Pulse 71   Temp 97.9 F (36.6 C)   Resp 18   SpO2 97%   Physical Exam Vitals and nursing note reviewed.  Constitutional:      General: He is not in acute distress.    Appearance: He is well-developed. He is not ill-appearing, toxic-appearing or diaphoretic.  HENT:     Head: Normocephalic and atraumatic.     Nose: No congestion or rhinorrhea.     Mouth/Throat:     Mouth: Mucous membranes are dry.     Pharynx: No oropharyngeal exudate or posterior oropharyngeal erythema.  Eyes:     Extraocular Movements: Extraocular movements intact.     Conjunctiva/sclera: Conjunctivae normal.     Pupils: Pupils are equal, round, and reactive to light.  Cardiovascular:     Rate and Rhythm: Normal rate and regular rhythm.     Pulses: Normal pulses.     Heart sounds: No murmur heard. Pulmonary:     Effort: Pulmonary effort is normal. No respiratory distress.     Breath sounds: Normal breath sounds. No wheezing, rhonchi or rales.  Chest:     Chest wall: No tenderness.  Abdominal:     General: Abdomen is flat.     Palpations: Abdomen is soft.     Tenderness: There is no abdominal tenderness. There is no guarding or rebound.  Musculoskeletal:        General: No swelling or tenderness.     Cervical back: Neck supple. No tenderness.  Skin:    General: Skin is warm and dry.     Capillary Refill: Capillary refill  takes less than 2 seconds.     Findings: No erythema or rash.  Neurological:     General: No focal deficit present.     Mental Status: He is alert.     Sensory: No sensory deficit.     Motor: No weakness.  Psychiatric:        Attention and Perception: He perceives auditory and visual hallucinations.        Mood and Affect: Mood is anxious.        Behavior: Behavior is actively hallucinating. Behavior is not agitated or aggressive.        Thought Content: Thought content is paranoid and delusional. Thought content includes suicidal ideation. Thought content does not include homicidal ideation. Thought content does not include homicidal plan.     (all labs ordered are listed, but only abnormal results are displayed) Labs Reviewed  COMPREHENSIVE METABOLIC PANEL WITH GFR -  Abnormal; Notable for the following components:      Result Value   Sodium 134 (*)    Glucose, Bld 662 (*)    Creatinine, Ser 1.26 (*)    Albumin 3.1 (*)    ALT 58 (*)    All other components within normal limits  CBC - Abnormal; Notable for the following components:   Hemoglobin 12.9 (*)    All other components within normal limits  RAPID URINE DRUG SCREEN, HOSP PERFORMED - Abnormal; Notable for the following components:   Cocaine POSITIVE (*)    All other components within normal limits  URINALYSIS, W/ REFLEX TO CULTURE (INFECTION SUSPECTED) - Abnormal; Notable for the following components:   Color, Urine STRAW (*)    Glucose, UA >=500 (*)    All other components within normal limits  BASIC METABOLIC PANEL WITH GFR - Abnormal; Notable for the following components:   Sodium 132 (*)    Glucose, Bld 664 (*)    Creatinine, Ser 1.28 (*)    Calcium  8.8 (*)    All other components within normal limits  OSMOLALITY - Abnormal; Notable for the following components:   Osmolality 317 (*)    All other components within normal limits  I-STAT VENOUS BLOOD GAS, ED - Abnormal; Notable for the following components:   pO2,  Ven 46 (*)    Sodium 134 (*)    Calcium , Ion 1.14 (*)    All other components within normal limits  CBG MONITORING, ED - Abnormal; Notable for the following components:   Glucose-Capillary 534 (*)    All other components within normal limits  CBG MONITORING, ED - Abnormal; Notable for the following components:   Glucose-Capillary 567 (*)    All other components within normal limits  CBG MONITORING, ED - Abnormal; Notable for the following components:   Glucose-Capillary 251 (*)    All other components within normal limits  CBG MONITORING, ED - Abnormal; Notable for the following components:   Glucose-Capillary 115 (*)    All other components within normal limits  ETHANOL  BETA-HYDROXYBUTYRIC ACID  BASIC METABOLIC PANEL WITH GFR  BASIC METABOLIC PANEL WITH GFR  BASIC METABOLIC PANEL WITH GFR  BASIC METABOLIC PANEL WITH GFR    EKG: None  Radiology: No results found.   Procedures   Medications Ordered in the ED  lactated ringers  infusion ( Intravenous New Bag/Given 01/23/24 1223)  dextrose  50 % solution 0-50 mL (has no administration in time range)  potassium chloride  10 mEq in 100 mL IVPB (10 mEq Intravenous Not Given 01/23/24 1314)  enoxaparin  (LOVENOX ) injection 40 mg (40 mg Subcutaneous Given 01/23/24 1413)  sodium chloride  flush (NS) 0.9 % injection 3 mL (3 mLs Intravenous Not Given 01/23/24 1416)  acetaminophen  (TYLENOL ) tablet 650 mg (has no administration in time range)    Or  acetaminophen  (TYLENOL ) suppository 650 mg (has no administration in time range)  ondansetron  (ZOFRAN ) tablet 4 mg (has no administration in time range)    Or  ondansetron  (ZOFRAN ) injection 4 mg (has no administration in time range)  0.9 %  sodium chloride  infusion ( Intravenous New Bag/Given 01/23/24 1412)  insulin  aspart (novoLOG ) injection 0-15 Units (has no administration in time range)  insulin  aspart (novoLOG ) injection 0-5 Units (has no administration in time range)  insulin   glargine-yfgn (SEMGLEE ) injection 15 Units (15 Units Subcutaneous Given 01/23/24 1412)  nicotine  (NICODERM CQ  - dosed in mg/24 hours) patch 21 mg (21 mg Transdermal Patient Refused/Not Given 01/23/24 1415)  lactated ringers  bolus 1,360 mL (0 mLs Intravenous Stopped 01/23/24 1336)                                    Medical Decision Making Amount and/or Complexity of Data Reviewed Labs: ordered.  Risk Prescription drug management. Decision regarding hospitalization.    Aliou Mealey is a 63 y.o. male with a past medical history significant for diabetes with neuropathies, hypertension, hypercholesterolemia, polysubstance abuse, previous angioedema, and depression who presents for suicidal ideation and hallucinations.  According to patient, he is tired and tells me he is having suicidal thoughts.  He reports that he has tried to kill himself in the past by overdosing on insulin  and nearly stepped himself in the neck with a fork.  He tells me that he does not have a plan now but says that he does not have to have a plan before he does anything.  He denies homicidal ideation but does report he has not been sleeping well and has been having both visual and auditory hallucinations again.  He denies any current pains.  Denies any chest pain headache abdominal pain or back pain.  Denies nausea vomiting constipation diarrhea or urinary changes today.  He tells me his glucoses have been up and down and he has had multiple visits to the emergency department over the last few days for hyperglycemia and various complaints.  Patient starts telling me that he is paranoid that there is something they are hiding from me about his medical care and his glucose management.  He also tells me that as he keeps coming to and from the emergency department he does not have his key to get to his medications and insulin  so he has not been able to take any of it recently.  On exam, lungs clear.  Chest nontender.   Abdomen nontender.  Patient moving all extremities.  Symmetric smile.  Clear speech.  Pupil symmetric and reactive with normal extraocular movements.  Patient no distress.  He is drinking juice and eating crackers.  Due to his recent visits, we will check some labs for medical clearance but anticipate TTS consultation for suicidal ideation and paranoia and hallucinations.  Anticipate reassessment after workup.  9:26 AM Workup continues to return.  Initial metabolic panel shows glucose over 660.  It was over 700 yesterday.  Although patient is not in DKA, with his hallucinations and suicidal thoughts, early HHS is a consideration.  Given multiple days of return for hyperglycemia and other complaints and now suicidal thoughts and hallucinations, I do feel he likely needs admission for medical and glucose stabilization.  Will order a insulin  drip with glucose stabilizer and use the order set.  Will otherwise add on osmolality, beta hydroxy uric acid, and urinalysis to look for ketones.  Will get a venous blood gas as well.  Will call for medical admission and anticipate inpatient psychiatry consultation as opposed to TTS here because I feel he needs admission.      Final diagnoses:  Suicidal ideation  Hyperglycemia  Paranoia (HCC)  Hallucinations     Clinical Impression: 1. Suicidal ideation   2. Hyperglycemia   3. Paranoia (HCC)   4. Hallucinations     Disposition: Admit  This note was prepared with assistance of Dragon voice recognition software. Occasional wrong-word or sound-a-like substitutions may have occurred due to the inherent limitations of voice recognition software.     Orlandis Sanden,  Lonni PARAS, MD 01/23/24 [phone]

## 2024-01-23 NOTE — ED Notes (Signed)
 Dr Claudene notified of PT blood sugar, insulin  discontinued per order

## 2024-01-23 NOTE — Discharge Instructions (Signed)
 You have refills of your medications at the pharmacy.  Please pick them up and take as directed. Follow-up with your primary care doctor. Return here for new concerns.

## 2024-01-23 NOTE — Plan of Care (Signed)
 Patient is progressing in terms of feeling socially connected, and says he doesn't feel suicidal now that he's around all the people here. However, patient is also dismayed he has no plan after leaving the hospital and is hoping social work can help him find housing or connect him with resources. Previously lived with brother but bother moved, I didn't get the entire story.

## 2024-01-23 NOTE — ED Notes (Signed)
 Per provider, give long-acting insulin  now, post-insulin  drip discontinuation

## 2024-01-23 NOTE — ED Provider Notes (Addendum)
 [place] [facility] DE[facility]MENT AT [facility] Provider Note   CSN: [id number] Arrival date & time: 01/22/24  1901     Patient presents with: Hyperglycemia   David Wyatt is a 63 y.o. male.   The history is provided by the patient and medical records.  Hyperglycemia  63 year old male with history of hypertension, diabetes, substance abuse, peripheral neuropathy, presenting to the ED with hyperglycemia.  Patient was seen in the ED yesterday afternoon.  After discharge he remained in the lobby and ate several snacks so felt like his blood sugar went up again.  He denies any nausea, vomiting, or urinary frequency.  He did get insulin  during ED visit just a few hours ago.  He states he has not taken his home meds because his medicines are locked in a storage unit and he cannot find the key.  Prior to Admission medications   Medication Sig Start Date End Date Taking? Authorizing Provider  Accu-Chek Softclix Lancets lancets Use to check blood sugar three times daily as directed 11/01/23   Shrivastava, Aryendra, MD  atorvastatin  (LIPITOR) 40 MG tablet Take 1 tablet (40 mg total) by mouth daily. 01/19/24   Newlin, Enobong, MD  Blood Glucose Monitoring Suppl (ACCU-CHEK GUIDE ME) w/Device KIT Use to test blood sugar 3 times daily. 11/01/23   Shrivastava, Aryendra, MD  DULoxetine  (CYMBALTA ) 30 MG capsule Take 3 capsules (90 mg total) by mouth daily. Patient not taking: Reported on 01/19/2024 12/13/23   Jadapalle, Sree, MD  gabapentin  (NEURONTIN ) 300 MG capsule Take 1 capsule (300 mg total) by mouth 3 (three) times daily. 12/12/23   Donnelly Mellow, MD  glucose blood (ACCU-CHEK GUIDE TEST) test strip Use as instructed to check blood sugar three times daily Patient taking differently: 1 each by Other route daily at 6 (six) AM. Use as instructed to check blood sugar three times daily 11/01/23   Hellen Becket, MD  insulin  aspart (NOVOLOG ) 100 UNIT/ML FlexPen Inject 3 Units into the skin  3 (three) times daily with meals. 12/27/23   Zelaya, Oscar A, PA-C  insulin  glargine (LANTUS ) 100 UNIT/ML Solostar Pen Inject 12 Units into the skin at bedtime. 12/27/23   Zelaya, Oscar A, PA-C  Insulin  Pen Needle 32G X 4 MM MISC Use 1 each to inject insulin  into the skin 4 (four) times daily -  before meals and at bedtime. 12/28/23   Zelaya, Oscar A, PA-C  lidocaine  (LIDODERM ) 5 % Place 1 patch onto the skin daily. Remove & Discard patch within 12 hours or as directed by MD Patient not taking: Reported on 01/19/2024 01/15/24   Kingsley, Victoria K, DO  metFORMIN  (GLUCOPHAGE ) 1000 MG tablet Take 1 tablet (1,000 mg total) by mouth 2 (two) times daily with a meal. 12/12/23   Donnelly Mellow, MD  ondansetron  (ZOFRAN ) 4 MG tablet Take 1 tablet (4 mg total) by mouth every 6 (six) hours. Patient not taking: Reported on 01/19/2024 12/16/23   Lang Norleen POUR, PA-C  pantoprazole  (PROTONIX ) 40 MG tablet Take 1 tablet (40 mg total) by mouth daily. Patient not taking: Reported on 01/19/2024 11/30/23   Rosario Eland I, MD  traZODone  (DESYREL ) 50 MG tablet Take 1 tablet (50 mg total) by mouth at bedtime as needed for sleep. 12/12/23   Donnelly Mellow, MD    Allergies: Lisinopril     Review of Systems  Endocrine:       Hyperglycemia  All other systems reviewed and are negative.   Updated Vital Signs BP (!) 144/92  Pulse (!) 59   Temp 98 F (36.7 C) (Oral)   Resp 18   Ht 5' 8 (1.727 m)   Wt 68 kg   SpO2 99%   BMI 22.81 kg/m   Physical Exam Vitals and nursing note reviewed.  Constitutional:      Appearance: He is well-developed.     Comments: Sleeping, awoken for exam  HENT:     Head: Normocephalic and atraumatic.  Eyes:     Conjunctiva/sclera: Conjunctivae normal.     Pupils: Pupils are equal, round, and reactive to light.  Cardiovascular:     Rate and Rhythm: Normal rate and regular rhythm.     Heart sounds: Normal heart sounds.  Pulmonary:     Effort: Pulmonary effort is normal.      Breath sounds: Normal breath sounds.  Abdominal:     General: Bowel sounds are normal.     Palpations: Abdomen is soft.  Musculoskeletal:        General: Normal range of motion.     Cervical back: Normal range of motion.  Skin:    General: Skin is warm and dry.  Neurological:     Mental Status: He is alert and oriented to person, place, and time.      (all labs ordered are listed, but only abnormal results are displayed) Labs Reviewed  CBC - Abnormal; Notable for the following components:      Result Value   RBC 4.15 (*)    All other components within normal limits  URINALYSIS, ROUTINE W REFLEX MICROSCOPIC - Abnormal; Notable for the following components:   Glucose, UA >=500 (*)    All other components within normal limits  CBG MONITORING, ED - Abnormal; Notable for the following components:   Glucose-Capillary 383 (*)    All other components within normal limits  CBG MONITORING, ED - Abnormal; Notable for the following components:   Glucose-Capillary 349 (*)    All other components within normal limits  I-STAT CHEM 8, ED - Abnormal; Notable for the following components:   Glucose, Bld 458 (*)    Calcium , Ion 1.10 (*)    All other components within normal limits  CBG MONITORING, ED    EKG: None  Radiology: No results found.   Procedures   Medications Ordered in the ED - No data to display                                  Medical Decision Making Amount and/or Complexity of Data Reviewed Labs: ordered. ECG/medicine tests: ordered and independent interpretation performed.   63 y.o. M here with hyperglycemia.  Seen in ED yesterday afternoon, sat in lobby overnight eating snacks and checked back in as he felt his sugar went up again.  Has not been taking home meds.  Was given insulin  just a few hours ago during ER visit.  Labs as above--hyperglycemia noted but improved from prior.  His anion gap remains normal at 13.  UA without any ketones present.  This is not  consistent with DKA.  It does appear that he just had his medications refilled from his primary care doctor.  I have encouraged him to pick these up and take as directed.  He will need to follow-up with his doctor for ongoing management of his diabetes.  Can return here for new concerns.  5:44 AM At time of discharge patient is upset that we have not  given him any insulin .  He received 30 units during his visit yesterday afternoon.  He told me his medications are locked in a storage unit and has no money to get new prescriptions filled until 12/1.  I have placed a TOC consult for medication assistance.  Final diagnoses:  Hyperglycemia    ED Discharge Orders     None          Jarold Olam HERO, PA-C 01/23/24 0440    Jarold Olam HERO, PA-C 01/23/24 9453    Trine Raynell Moder, MD 01/23/24 [phone]

## 2024-01-23 NOTE — H&P (Signed)
 History and Physical    Patient: David Wyatt FMW:[id number] DOB: [birth year] DOA: 01/23/2024 DOS: the patient was seen and examined on 01/23/2024 PCP: Delbert Clam, MD  Patient coming from: Home  Chief Complaint:  Chief Complaint  Patient presents with   Suicidal   HPI: David Wyatt is a 63 y.o. male with medical history significant of diabetes mellitus type 2 with neuropathy, polysubstance abuse presents with uncontrolled blood sugars and homelessness.  He has been experiencing difficulty controlling his blood sugars, with levels consistently rising. He has been unable to access his medications due to losing the key to his storage. Despite this, he continues to check his blood sugar levels, which have been high, with a recent reading of 660 mg/dL. Symptoms associated with his high blood sugar include frequent urination, inability to sleep, and extreme hunger.  He has a history of back surgery and still has hardware in his back, which he believes contributes to a sensation of coldness. He experiences back pain and sciatica, with pain radiating down one leg. Additionally, he reports knee pain and tightness, describing it as a cramp.  He hears voices but states they do not instruct him to do anything. He has not been on medication for these symptoms in the past and is unsure if he has seen a psychiatrist. He feels depressed and lonely, stating he has no family to contact.  Socially, he is homeless and smokes a pack of cigarettes a day. He uses crack cocaine and drinks alcohol when feeling 'down and out,' consuming a lot when he has access to it. He mentions difficulty standing on his feet.  In the emergency department patient was noted to be afebrile with blood pressures elevated up to 169/84, and all other vital signs maintained.  Labs significant for sodium 134, CO2 24, BUN 14, creatinine 128, glucose 664, osmolarity 317, and anion gap 12.  Venous pH was within normal limits.  Urinalysis  was pending.  Patient was bolused 1.36 L of normal saline IV fluids and started on insulin  drip per protocol.  Reported to be hallucinating with reports of suicidal ideations.   Review of Systems: As mentioned in the history of present illness. All other systems reviewed and are negative. Past Medical History:  Diagnosis Date   Angioedema of lips 07/28/2012   left upper (07/29/2012)   Arthritis    hands and back   Chronic back pain    Cocaine abuse (HCC)    Diabetic neuropathy (HCC)    High cholesterol    Hypertension    Neuropathy    Type II diabetes mellitus (HCC)    Past Surgical History:  Procedure Laterality Date   BACK SURGERY     CYSTOSCOPY W/ URETERAL STENT PLACEMENT Right 02/23/2021   Procedure: CYSTOSCOPY WITH RETROGRADE PYELOGRAM/URETERAL STENT PLACEMENT;  Surgeon: Selma Donnice SAUNDERS, MD;  Location: [facility] OR[facility];  Service: Urology;  Laterality: Right;   CYSTOSCOPY/URETEROSCOPY/HOLMIUM LASER/STENT PLACEMENT Right 12/05/2022   Procedure: CYSTOSCOPY, RIGHT URETEROSCOPY, AND RIGHT URETERAL STENT PLACEMENT;  Surgeon: Shane Steffan BROCKS, MD;  Location: [facility];  Service: Urology;  Laterality: Right;  90 MINUTES   HERNIA REPAIR Right 04/01/2012   I & D EXTREMITY Left 12/13/2012   Procedure: IRRIGATION AND DEBRIDEMENT LEFT THUMB;  Surgeon: Franky SAUNDERS Curia, MD;  Location: [facility] OR;  Service: Orthopedics;  Laterality: Left;   INCISION AND DRAINAGE OF WOUND     boil on back/notes 07/14/2008 (07/29/2012)   INGUINAL HERNIA REPAIR  04/01/2012   Procedure: HERNIA REPAIR INGUINAL  ADULT;  Surgeon: Sherlean JINNY Laughter, MD;  Location: [facility] OR;  Service: General;  Laterality: Right;   INGUINAL HERNIA REPAIR Left 09/06/2016   Procedure: OPEN REPAIR LEFT INGUINAL HERNIA;  Surgeon: Tanda Locus, MD;  Location: [facility] OR;  Service: General;  Laterality: Left;   INSERTION OF MESH Left 09/06/2016   Procedure: INSERTION OF MESH;  Surgeon: Tanda Locus, MD;  Location: [facility] OR;  Service: General;  Laterality: Left;   TONSILLECTOMY      Social History:  reports that he has quit smoking. His smoking use included cigarettes. He has a 7.5 pack-year smoking history. He quit smokeless tobacco use about 8 years ago. He reports that he does not currently use alcohol. He reports that he does not currently use drugs.  Allergies  Allergen Reactions   Lisinopril  Anaphylaxis, Swelling and Other (See Comments)    Lip Swelling and angioedema, too     Family History  Problem Relation Age of Onset   Diabetes Mother    Kidney disease Mother    Hyperlipidemia Mother    Hyperlipidemia Father    Diabetes Father     Prior to Admission medications   Medication Sig Start Date End Date Taking? Authorizing Provider  Accu-Chek Softclix Lancets lancets Use to check blood sugar three times daily as directed 11/01/23   Shrivastava, Aryendra, MD  atorvastatin  (LIPITOR) 40 MG tablet Take 1 tablet (40 mg total) by mouth daily. 01/19/24   Newlin, Enobong, MD  Blood Glucose Monitoring Suppl (ACCU-CHEK GUIDE ME) w/Device KIT Use to test blood sugar 3 times daily. 11/01/23   Shrivastava, Aryendra, MD  DULoxetine  (CYMBALTA ) 30 MG capsule Take 3 capsules (90 mg total) by mouth daily. Patient not taking: Reported on 01/19/2024 12/13/23   Jadapalle, Sree, MD  gabapentin  (NEURONTIN ) 300 MG capsule Take 1 capsule (300 mg total) by mouth 3 (three) times daily. 12/12/23   Donnelly Mellow, MD  glucose blood (ACCU-CHEK GUIDE TEST) test strip Use as instructed to check blood sugar three times daily Patient taking differently: 1 each by Other route daily at 6 (six) AM. Use as instructed to check blood sugar three times daily 11/01/23   Shrivastava, Aryendra, MD  insulin  aspart (NOVOLOG ) 100 UNIT/ML FlexPen Inject 3 Units into the skin 3 (three) times daily with meals. 12/27/23   Zelaya, Oscar A, PA-C  insulin  glargine (LANTUS ) 100 UNIT/ML Solostar Pen Inject 12 Units into the skin at bedtime. 12/27/23   Zelaya, Oscar A, PA-C  Insulin  Pen Needle 32G X 4 MM MISC Use 1  each to inject insulin  into the skin 4 (four) times daily -  before meals and at bedtime. 12/28/23   Zelaya, Oscar A, PA-C  lidocaine  (LIDODERM ) 5 % Place 1 patch onto the skin daily. Remove & Discard patch within 12 hours or as directed by MD Patient not taking: Reported on 01/19/2024 01/15/24   Kingsley, Victoria K, DO  metFORMIN  (GLUCOPHAGE ) 1000 MG tablet Take 1 tablet (1,000 mg total) by mouth 2 (two) times daily with a meal. 12/12/23   Donnelly Mellow, MD  ondansetron  (ZOFRAN ) 4 MG tablet Take 1 tablet (4 mg total) by mouth every 6 (six) hours. Patient not taking: Reported on 01/19/2024 12/16/23   Lang Norleen POUR, PA-C  pantoprazole  (PROTONIX ) 40 MG tablet Take 1 tablet (40 mg total) by mouth daily. Patient not taking: Reported on 01/19/2024 11/30/23   Rosario Eland I, MD  traZODone  (DESYREL ) 50 MG tablet Take 1 tablet (50 mg total) by mouth at bedtime as needed  for sleep. 12/12/23   Donnelly Mellow, MD    Physical Exam: Vitals:   01/23/24 0702 01/23/24 1035  BP: (!) 139/101   Pulse: 71   Resp: 18   Temp: 97.9 F (36.6 C)   SpO2: 97% 97%    Constitutional: Middle-age male NAD, calm, comfortable Eyes: PERRL, lids and conjunctivae normal ENMT: Mucous membranes are moist.  Poor dentition Neck: normal, supple Respiratory: clear to auscultation bilaterally, no wheezing, no crackles. Normal respiratory effort. No accessory muscle use.  Cardiovascular: Regular rate and rhythm, no murmurs / rubs / gallops. No extremity edema. 2+ pedal pulses. No carotid bruits.  Abdomen: no tenderness, no masses palpated. Bowel sounds positive.  Musculoskeletal: no clubbing / cyanosis. No joint deformity upper and lower extremities. Good ROM, no contractures. Normal muscle tone.  Skin: no rashes, lesions, ulcers.  Neurologic: CN 2-12 grossly intact.  Abnormal sensation of the lower extremities strength 5/5 in all 4.  Psychiatric: Normal judgment and insight.  Reports auditory hallucinations.  Depressed  mood  Data Reviewed:  Reviewed labs, imaging, and pertinent records as documented.  Assessment and Plan:  Hyperglycemic hyperosmolar syndrome Patient presents with complaints of elevated blood sugars into the 700s.  Patient without insulin  and adequate housing.  Blood sugars elevated up to 664 with CO2 22, anion gap 12, venous pH 7.343, and serum osmolarity 370.  Patient had initially been placed on insulin  drip and bolused IV fluids.  Patient meets criteria for HHS - Admit to a progressive bed - Orders placed to transition off of insulin  drip - Semglee  15 units daily  - CBGs before every meal with moderate SSI - Diabetes education consulted - Transitions of care needed for medications  Depression with suicidal ideation Hallucination Patient reports feeling depressed for which he feels like he has nothing to live for at this time.  Makes note of auditory hallucinations. - Recruitment consultant - Will need to formally consult inpatient psychiatry possibly in a.m.  Diabetic neuropathy - Continue diabetic  Hyperlipidemia - Restart atorvastatin   Housing insecurity - Transitions of care consulted, we will follow-up for any further recommendations.  Polysubstance abuse Patient admits to smoking cigarettes, using crack cocaine and alcohol when he has it available. - Continue to counsel need of cessation of substance use - Nicotine  patch was offered - CIWA protocols were initiated with Ativan  as needed   DVT prophylaxis: Lovenox  Advance Care Planning:   Code Status: Full Code    Consults:   Family Communication: None  Severity of Illness: The appropriate patient status for this patient is OBSERVATION. Observation status is judged to be reasonable and necessary in order to provide the required intensity of service to ensure the patient's safety. The patient's presenting symptoms, physical exam findings, and initial radiographic and laboratory data in the context of their medical  condition is felt to place them at decreased risk for further clinical deterioration. Furthermore, it is anticipated that the patient will be medically stable for discharge from the hospital within 2 midnights of admission.   Author: Maximino DELENA Sharps, MD 01/23/2024 11:32 AM  For on call review [email or web address].

## 2024-01-23 NOTE — ED Notes (Signed)
 This RN went to discharge pt and he is frustrated we have not given him any insulin  while he has been here and he cannot pick his prescriptions up until dec 1st dt money issues. Pt wanted RN to retake his sugar and found to be 331, pt has checked in a few times for this, PA messaged.

## 2024-01-23 NOTE — Progress Notes (Signed)
 Visited and Prayed with him per request. Chaplain available as needed.  Rayleen Dade, [place], [facility], Pager [phone]

## 2024-01-23 NOTE — Inpatient Diabetes Management (Addendum)
 Inpatient Diabetes Program Recommendations  AACE/ADA: New Consensus Statement on Inpatient Glycemic Control  Target Ranges:  Prepandial:   less than 140 mg/dL      Peak postprandial:   less than 180 mg/dL (1-2 hours)      Critically ill patients:  140 - 180 mg/dL   Lab Results  Component Value Date   GLUCAP 251 (H) 01/23/2024   HGBA1C 12.4 (H) 12/06/2023    Latest Reference Range & Units 01/23/24 04:01 01/23/24 05:36 01/23/24 10:49 01/23/24 11:28 01/23/24 12:30  Glucose-Capillary 70 - 99 mg/dL 650 (H) 668 (H) 465 (HH) 567 (HH) 251 (H)    Latest Reference Range & Units 01/23/24 09:25  Potassium 3.5 - 5.1 mmol/L 4.9  Chloride 98 - 111 mmol/L 98  CO2 22 - 32 mmol/L 22  Glucose 70 - 99 mg/dL 335 (HH)  BUN 8 - 23 mg/dL 14  Creatinine 9.38 - 8.75 mg/dL 8.71 (H)  Calcium  8.9 - 10.3 mg/dL 8.8 (L)  Anion gap 5 - 15  12   Review of Glycemic Control  Diabetes history:   Outpatient Diabetes medications:  Per note, patient not taken his home meds because his medicines are locked in a storage unit and he cannot find the key.  Metformin  1,000mg  BID  Lantus  12 units daily  Novolog  3 units TID   Current orders for Inpatient glycemic control:  IV insulin   Transition orders:  Semglee  15 units daily  Novolog  0-15 units TID + 0-5 units at bedtime   Inpatient Diabetes Program Recommendations:   Noted:  Diabetes Coordinator consult - A1C 12.4%   Agree with current plan.   Patient qualifies for [facility] - Prescriptions will need to be sent to [facility] [address] [place] [address] at discharge.   Diabetes Coordinator will continue to follow.   Thanks,  Lavanda Search, RN, MSN, [facility]  Inpatient Diabetes Coordinator  Pager [phone] (8a-5p)

## 2024-01-24 ENCOUNTER — Other Ambulatory Visit (HOSPITAL_COMMUNITY): Payer: Self-pay

## 2024-01-24 DIAGNOSIS — E1100 Type 2 diabetes mellitus with hyperosmolarity without nonketotic hyperglycemic-hyperosmolar coma (NKHHC): Secondary | ICD-10-CM | POA: Diagnosis not present

## 2024-01-24 DIAGNOSIS — R440 Auditory hallucinations: Secondary | ICD-10-CM | POA: Diagnosis not present

## 2024-01-24 DIAGNOSIS — F22 Delusional disorders: Secondary | ICD-10-CM

## 2024-01-24 DIAGNOSIS — F1420 Cocaine dependence, uncomplicated: Secondary | ICD-10-CM | POA: Diagnosis not present

## 2024-01-24 DIAGNOSIS — Z59819 Housing instability, housed unspecified: Secondary | ICD-10-CM | POA: Diagnosis not present

## 2024-01-24 DIAGNOSIS — E1142 Type 2 diabetes mellitus with diabetic polyneuropathy: Secondary | ICD-10-CM | POA: Diagnosis not present

## 2024-01-24 DIAGNOSIS — F1910 Other psychoactive substance abuse, uncomplicated: Secondary | ICD-10-CM | POA: Diagnosis not present

## 2024-01-24 DIAGNOSIS — E7800 Pure hypercholesterolemia, unspecified: Secondary | ICD-10-CM | POA: Diagnosis not present

## 2024-01-24 LAB — GLUCOSE, CAPILLARY
Glucose-Capillary: 269 mg/dL — ABNORMAL HIGH (ref 70–99)
Glucose-Capillary: 272 mg/dL — ABNORMAL HIGH (ref 70–99)
Glucose-Capillary: 110 mg/dL — ABNORMAL HIGH (ref 70–99)

## 2024-01-24 LAB — BASIC METABOLIC PANEL WITH GFR
Anion gap: 8 (ref 5–15)
BUN: 16 mg/dL (ref 8–23)
CO2: 22 mmol/L (ref 22–32)
Calcium: 8.1 mg/dL — ABNORMAL LOW (ref 8.9–10.3)
Chloride: 105 mmol/L (ref 98–111)
Creatinine, Ser: 1.06 mg/dL (ref 0.61–1.24)
GFR, Estimated: >60 mL/min (ref >60)
Glucose, Bld: 265 mg/dL — ABNORMAL HIGH (ref 70–99)
Potassium: 3.8 mmol/L (ref 3.5–5.1)
Sodium: 135 mmol/L (ref 135–145)

## 2024-01-24 LAB — CBC
HCT: 36.6 % — ABNORMAL LOW (ref 39.0–52.0)
Hemoglobin: 12.2 g/dL — ABNORMAL LOW (ref 13.0–17.0)
MCH: 30.9 pg (ref 26.0–34.0)
MCHC: 33.3 g/dL (ref 30.0–36.0)
MCV: 92.7 fL (ref 80.0–100.0)
Platelets: 290 K/uL (ref 150–400)
RBC: 3.95 MIL/uL — ABNORMAL LOW (ref 4.22–5.81)
RDW: 12.8 % (ref 11.5–15.5)
WBC: 6.3 K/uL (ref 4.0–10.5)
nRBC: 0 % (ref 0.0–0.2)

## 2024-01-24 LAB — PHOSPHORUS: Phosphorus: 2.9 mg/dL (ref 2.5–4.6)

## 2024-01-24 LAB — MAGNESIUM: Magnesium: 1.7 mg/dL (ref 1.7–2.4)

## 2024-01-24 MED ORDER — ATORVASTATIN CALCIUM 40 MG PO TABS
40.0000 mg | ORAL_TABLET | Freq: Every day | ORAL | 3 refills | Status: DC
  Filled 2024-01-24: qty 90, 90d supply, fill #0

## 2024-01-24 MED ORDER — METFORMIN HCL 1000 MG PO TABS
1000.0000 mg | ORAL_TABLET | Freq: Two times a day (BID) | ORAL | 2 refills | Status: DC
  Filled 2024-01-24: qty 180, 90d supply, fill #0

## 2024-01-24 MED ORDER — FOLIC ACID 1 MG PO TABS
1.0000 mg | ORAL_TABLET | Freq: Every day | ORAL | 2 refills | Status: AC
  Filled 2024-01-24: qty 30, 30d supply, fill #0
  Filled 2024-02-04: qty 30, 30d supply, fill #1

## 2024-01-24 MED ORDER — INSULIN ASPART 100 UNIT/ML IJ SOLN
5.0000 [IU] | Freq: Three times a day (TID) | INTRAMUSCULAR | Status: DC
  Administered 2024-01-24: 5 [IU] via SUBCUTANEOUS
  Filled 2024-01-24: qty 5

## 2024-01-24 MED ORDER — INSULIN ASPART 100 UNIT/ML FLEXPEN
10.0000 [IU] | PEN_INJECTOR | Freq: Three times a day (TID) | SUBCUTANEOUS | 3 refills | Status: DC
  Filled 2024-01-24: qty 9, 84d supply, fill #0

## 2024-01-24 MED ORDER — AMLODIPINE BESYLATE 5 MG PO TABS
5.0000 mg | ORAL_TABLET | Freq: Every day | ORAL | Status: DC
  Administered 2024-01-24: 5 mg via ORAL
  Filled 2024-01-24: qty 1

## 2024-01-24 MED ORDER — DULOXETINE HCL 60 MG PO CPEP
60.0000 mg | ORAL_CAPSULE | Freq: Every day | ORAL | 3 refills | Status: DC
  Filled 2024-01-24: qty 30, 30d supply, fill #0

## 2024-01-24 MED ORDER — AMLODIPINE BESYLATE 5 MG PO TABS
5.0000 mg | ORAL_TABLET | Freq: Every day | ORAL | 2 refills | Status: DC
  Filled 2024-01-24: qty 90, 90d supply, fill #0

## 2024-01-24 MED ORDER — ACCU-CHEK GUIDE TEST VI STRP
ORAL_STRIP | 1 refills | Status: DC
  Filled 2024-01-24: qty 100, 33d supply, fill #0

## 2024-01-24 MED ORDER — ADULT MULTIVITAMIN W/MINERALS CH
1.0000 | ORAL_TABLET | Freq: Every day | ORAL | 2 refills | Status: AC
  Filled 2024-01-24: qty 90, 90d supply, fill #0

## 2024-01-24 MED ORDER — MAGNESIUM SULFATE 2 GM/50ML IV SOLN
2.0000 g | Freq: Once | INTRAVENOUS | Status: AC
  Administered 2024-01-24: 2 g via INTRAVENOUS
  Filled 2024-01-24: qty 50

## 2024-01-24 MED ORDER — INSULIN GLARGINE 100 UNIT/ML SOLOSTAR PEN
15.0000 [IU] | PEN_INJECTOR | Freq: Every day | SUBCUTANEOUS | 2 refills | Status: DC
  Filled 2024-01-24: qty 12, 80d supply, fill #0

## 2024-01-24 MED ORDER — ACCU-CHEK SOFTCLIX LANCETS MISC
1 refills | Status: DC
  Filled 2024-01-24: qty 100, 33d supply, fill #0

## 2024-01-24 MED ORDER — INSULIN PEN NEEDLE 32G X 4 MM MISC
1.0000 | Freq: Three times a day (TID) | 11 refills | Status: DC
  Filled 2024-01-24: qty 100, 25d supply, fill #0

## 2024-01-24 MED ORDER — GABAPENTIN 300 MG PO CAPS
300.0000 mg | ORAL_CAPSULE | Freq: Three times a day (TID) | ORAL | 2 refills | Status: DC
  Filled 2024-01-24: qty 270, 90d supply, fill #0

## 2024-01-24 MED ORDER — DULOXETINE HCL 60 MG PO CPEP: 60.0000 mg | ORAL_CAPSULE | Freq: Every day | ORAL | Status: DC

## 2024-01-24 MED ORDER — THIAMINE HCL 100 MG PO TABS
100.0000 mg | ORAL_TABLET | Freq: Every day | ORAL | 2 refills | Status: AC
  Filled 2024-01-24: qty 90, 90d supply, fill #0

## 2024-01-24 MED ORDER — DULOXETINE HCL 30 MG PO CPEP
30.0000 mg | ORAL_CAPSULE | Freq: Every morning | ORAL | 3 refills | Status: DC
  Filled 2024-01-24: qty 30, 30d supply, fill #0

## 2024-01-24 MED ORDER — TRAZODONE HCL 50 MG PO TABS
50.0000 mg | ORAL_TABLET | Freq: Every evening | ORAL | 0 refills | Status: DC | PRN
  Filled 2024-01-24: qty 30, 30d supply, fill #0

## 2024-01-24 MED ORDER — DULOXETINE HCL 30 MG PO CPEP: 30.0000 mg | ORAL_CAPSULE | Freq: Every morning | ORAL | Status: DC

## 2024-01-24 NOTE — Progress Notes (Signed)
 Discharge   Patient expressed verbal understanding of discharge POC.   Patient given time to ask any questions.  Additional education included in AVS.  Alert oriented in good spirits.   PIV removed.  Pressure dressings intact.  Discharging to lounge for Taxi voucher TOC meds given at bedside.

## 2024-01-24 NOTE — Hospital Course (Addendum)
 Partly taken from H&P.  David Wyatt is a 63 y.o. male with medical history significant of diabetes mellitus type 2 with neuropathy, polysubstance abuse presents with uncontrolled blood sugars and homelessness.   He has been experiencing difficulty controlling his blood sugars, with levels consistently rising. He has been unable to access his medications due to losing the key to his storage. Despite this, he continues to check his blood sugar levels, which have been high, with a recent reading of 660 mg/dL. Symptoms associated with his high blood sugar include frequent urination, inability to sleep, and extreme hunger.  Patient was also experiencing back pain, radiating to leg.  Admitted to auditory hallucinations, feels depressed with suicidal ideations, crack cocaine and to binge drinking when feeling down.  On presentation hemodynamically stable, blood glucose of 664, creatinine 1.28, CO2 24, VBG within normal limit.  Patient received IV fluid and initially started on insulin  infusion per Endo tool protocol.  Later he was transition to basal and short acting.  11/15: Vital stable, CBG still little elevated at 269,   Psych was also consulted.  Magnesium  of 1.7-replacing.  Psych evaluated him and cleared him for discharge.  Per patient he does not want to hurt himself but make that statement to stay in the hospital as he is homeless.  He lost his medications, he was given all new medications including his insulin  from our [facility] pharmacy.  He was provided with resources for inpatient rehab programs for substance abuse as he seems interested.  Unfortunately we will not be able to get him to any of those programs from hospital.  Our social worker met him and provide him with all the resources he need.  Patient will continue on current medications and seems to be at his baseline now.  Patient need a close follow-up with his primary care provider for further assistance.

## 2024-01-24 NOTE — TOC Progression Note (Addendum)
 Transition of Care [facility]) - Progression Note    Patient Details  Name: David Wyatt MRN: [id number] Date of Birth: [birth year]  [facility] CM/SW Contact  Luise JAYSON Pan, [place] Phone Number: 01/24/2024, 12:25 PM  Clinical Narrative:   CSW met patient at bedside and provided local housing listings, community resources to assist with housing, and substance use resoruces. CSW reviewed resources with patient, per FS patient is orientation status is WDL. Patient reports he receives disability. Patient asked CSW how he would call the resources. CSW advised patient to go to the [facility] or [employer] and utilize the phones in those public areas. Patient expressed his understanding. CSW provided patient with bus passes.                     Expected Discharge Plan and Services                                               Social Drivers of Health (SDOH) Interventions SDOH Screenings   Food Insecurity: Food Insecurity Present (01/23/2024)  Housing: High Risk (01/23/2024)  Transportation Needs: Unmet Transportation Needs (01/23/2024)  Utilities: Not At Risk (01/23/2024)  Recent Concern: Utilities - At Risk (12/07/2023)  Alcohol Screen: Low Risk  (12/07/2023)  Depression (PHQ2-9): Medium Risk (12/06/2023)  Financial Resource Strain: High Risk (01/15/2024)  Social Connections: Unknown (10/24/2023)  Tobacco Use: Medium Risk (01/23/2024)    Readmission Risk Interventions     No data to display

## 2024-01-24 NOTE — Consult Note (Addendum)
 [facility] Psychiatric Consult Initial  Patient Name: .David Wyatt  MRN: [id number]  DOB: 10-12-1960  Consult Order details:  Orders (From admission, onward)     Start     Ordered   01/24/24 0909  IP CONSULT TO PSYCHIATRY       Ordering Provider: Caleen Qualia, MD  Provider:  (Not yet assigned)  Question Answer Comment  Location [facility]   Reason for Consult? Suicidal ideations with auditory hallucinations      01/24/24 0909             Mode of Visit: In person    Psychiatry Consult Evaluation  Service Date: January 24, 2024 LOS:  LOS: 0 days  Chief Complaint I don't want to hurt myself, I only said that to keep myself in the hospital because I don't feel safe outside as am homeless. However, I would like to go to rehab for drug use but no one is ready to help me.   Primary Psychiatric Diagnoses  Cocaine use disorder, moderate 2.  Cocaine induced mood and psychotic disorder  Assessment  David Wyatt is a 63 y.o. AA male with a past psychiatric history of severe polysubstance abuse, MDD, and substance-induced mood disorder, with pertinent medical comorbidities/history that include chronic pain, GERD, hyperlipidemia, essential hypertension, and diabetes, who presented to the ED suicidal thoughts and psychotic symptoms. Psychiatry was consulted for Suicidal ideations with auditory hallucinations.  His current presentation of suicidal thoughts and psychotic symptoms in the context of chronic cocaine abuse is most consistent with cocaine induced mood and psychotic disorder. Patient has had multiple visits to the ED for similar presentations and is usually discharged back to the streets, and comes back to the hospital.He does not meet criteria for inpatient psychiatric admission based on absent active suicidal ideation, plan or intent but patient is interested in inpatient drug rehab to better manage his drug use. Urine toxicology is positive for  cocaine.Current outpatient psychotropic medications include Cymbalta  90 mg daily, Gabapentin  300 mg three times daily and Trazodone  50 mg at bedtime for sleep and historically he has had a moderate response to these medications when he was taking them. However, patient is not compliant with medications prior to admission as evidenced by patient reports because he said he locked up the medications in his storage and does not have access to it. On initial examination, patient is awake, alert and oriented to time, place, person and situation.His mood is good with congruent affect. No perceptual disturbances. Please see plan below for detailed recommendations.   Diagnoses:  Active Hospital problems: Principal Problem:   Hyperosmolar hyperglycemic state (HHS) (HCC) Active Problems:   Diabetic neuropathy (HCC)   Hyperlipidemia   Auditory hallucination   Depression with suicidal ideation   Housing insecurity   Polysubstance abuse (HCC)    Plan   ## Psychiatric Medication Recommendations:  -Start Duloxetine  60 mg at bedtime and 30 mg every morning for depression/neuropathic pain -Continue Gabapentin  300 mg three times daily -Continue Trazodone  50 mg at bedtime for sleep -Please discontinue 1:1 sitter as patient does not need it.  -Patient will benefit from inpatient drug rehab to address his substance abuse and to prevent frequent ED visits once he's medically cleared.  -Please consult Social worker/TOC to assist with inpatient drug rehab transfer. -Psychiatric consult service will sign off at this time - Thank you for this consult. Please re-consult as needed.   ## Medical Decision Making Capacity: Not specifically addressed in this encounter  ##  Further Work-up:  -- Per the treating team UDSpositive for cocaine -- most recent EKG on 01/16/2024 had QtC of 442 -- Pertinent labwork reviewed earlier this admission includes: [id number]   ## Disposition:-- There are no psychiatric  contraindications to discharge at this time  ## Behavioral / Environmental: - No specific recommendations at this time.     ## Safety and Observation Level:  - Based on my clinical evaluation, I estimate the patient to be at low risk of self harm in the current setting. - At this time, we recommend  routine. This decision is based on my review of the chart including patient's history and current presentation, interview of the patient, mental status examination, and consideration of suicide risk including evaluating suicidal ideation, plan, intent, suicidal or self-harm behaviors, risk factors, and protective factors. This judgment is based on our ability to directly address suicide risk, implement suicide prevention strategies, and develop a safety plan while the patient is in the clinical setting. Please contact our team if there is a concern that risk level has changed.  CSSR Risk Category:C-SSRS RISK CATEGORY: Low Risk  Suicide Risk Assessment: Patient has following modifiable risk factors for suicide: recklessness and medication noncompliance, which we are addressing by prescribing medications. Patient has following non-modifiable or demographic risk factors for suicide: male gender and psychiatric hospitalization Patient has the following protective factors against suicide: no history of suicide attempts  Thank you for this consult request. Recommendations have been communicated to the primary team.  We will sign off at this time.   Jan DELENA Donath, MD       History of Present Illness  Relevant Aspects of [facility] Course:  Admitted on 01/23/2024 for suicidal ideation and hyperglycemia with poor Diabetic control.    Patient Report:  Patient seen face to face in his hospital room with 1:1 sitter at bedside. He is awake, alert, and oriented to time, place, person and situation. Patient is lying comfortably in bed, eating cookies and watching TV. When asked about suicide ideation,  patient reports, I don't want to hurt myself, I only said that to keep me in the hospital because I don't feel safe outside as am homeless. However, I would like to go to rehab for drug use but no one is ready to help me. Patient denies auditory or visual hallucinations, delusions, thought insertion or idea of reference. Review of EMR shows patient has had multiple ED visits for similar presentations and is usually discharged back to the streets, but comes back to the hospital. It will be advisable to connect patient to an inpatient drug rehab center to better manage his drug use. Urine toxicology is positive for cocaine.    Psych ROS:  As above.   ROS   Psychiatric and Social History  Psychiatric History:  Psychiatric History:    Information collected from patient, chart review   Prev Dx/Sx: MDD, severe, recurrent. Stimulant use disorder (cocaine). Current Psych Provider: none Home Meds (current): Cymbalta  90 mg daily, Gabapentin  300 mg three times daily and Trazodone  50 mg at bedtime for sleep.  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   Prior Psych Hospitalization: yes  Prior Self Harm: no NSSIB, previous attempts to overdose on insulin . Prior Violence: denies history   Family Psych History: denies Family Hx suicide: denies   Social History:  Developmental Hx: Born/raised in [place], [place]. Moved  to [place] ~40 years ago. Educational Hx: attended 12th grade, did not graduate Occupational Hx:  Legal Hx: denies any convictions Living Situation: homeless, unsheltered. Spiritual Hx: christian. Access to weapons/lethal means: patient denies.   Substance use history: Alcohol: Denies any current use History of alcohol withdrawal seizures: denies History of DT's: denies Tobacco: 1 pack per day, 20 year history Illicit drugs:  Marijuana/THC: denies  significant history Cocaine: Started at age 84. 41 year history. Longest period of sobriety was for 1 year after graduating from [employer]. Most recent use was between 1-2 months ago. UDS negative at admission.  Has not used in months. Methamphetamines: Never Benzodiazepines: Never Opioids: Denies significant or recent history Hallucinogens: Never Bath salts: Never Prescription Drug abuse problems: Denies Rehab hx: several programs in the past. None recent  Exam Findings  Physical Exam:  Vital Signs:  Temp:  [97.7 F (36.5 C)-98.6 F (37 C)] 97.7 F (36.5 C) (11/15 0751) Pulse Rate:  [57-82] 62 (11/15 0751) Resp:  [15-21] 18 (11/15 0751) BP: (127-151)/(73-84) 144/84 (11/15 0751) SpO2:  [94 %-100 %] 97 % (11/15 0751) Weight:  [72 kg] 72 kg (11/14 1744) Blood pressure (!) 144/84, pulse 62, temperature 97.7 F (36.5 C), temperature source Oral, resp. rate 18, height 5' 8 (1.727 m), weight 72 kg, SpO2 97%. Body mass index is 24.14 kg/m.  Physical Exam  Mental Status Exam: General Appearance: Casual  Orientation:  Full (Time, Place, and Person)  Memory:  Immediate;   Good Recent;   Good Remote;   Good  Concentration:  Concentration: Good and Attention Span: Good  Recall:  Good  Attention  Good  Eye Contact:  Good  Speech:  Normal Rate  Language:  Good  Volume:  Normal  Mood: I feel good   Affect:  Appropriate and Congruent  Thought Process:  Coherent and Linear  Thought Content:  Logical  Suicidal Thoughts:  No  Homicidal Thoughts:  No  Judgement:  Good  Insight:  Good  Psychomotor Activity:  Normal  Akathisia:  No  Fund of Knowledge:  Good      Assets:  Communication Skills  Cognition:  WNL  ADL's:  Intact  AIMS (if indicated):        Other History   These have been pulled in through the EMR, reviewed, and updated if appropriate.  Family History:  The patient's family history includes Diabetes in his father and mother; Hyperlipidemia  in his father and mother; Kidney disease in his mother.  Medical History: Past Medical History:  Diagnosis Date   Angioedema of lips 07/28/2012   left upper (07/29/2012)   Arthritis    hands and back   Chronic back pain    Cocaine abuse (HCC)    Diabetic neuropathy (HCC)    High cholesterol    Hypertension    Neuropathy    Type II diabetes mellitus (HCC)     Surgical History: Past Surgical History:  Procedure Laterality Date   BACK SURGERY     CYSTOSCOPY W/ URETERAL STENT PLACEMENT Right 02/23/2021   Procedure: CYSTOSCOPY WITH RETROGRADE PYELOGRAM/URETERAL STENT PLACEMENT;  Surgeon: Selma Donnice SAUNDERS, MD;  Location: [facility];  Service: Urology;  Laterality: Right;   CYSTOSCOPY/URETEROSCOPY/HOLMIUM LASER/STENT PLACEMENT Right 12/05/2022   Procedure: CYSTOSCOPY, RIGHT URETEROSCOPY, AND RIGHT URETERAL STENT PLACEMENT;  Surgeon: Shane Steffan BROCKS, MD;  Location: [facility];  Service: Urology;  Laterality: Right;  90 MINUTES   HERNIA REPAIR Right 04/01/2012   I & D EXTREMITY Left  12/13/2012   Procedure: IRRIGATION AND DEBRIDEMENT LEFT THUMB;  Surgeon: Franky JONELLE Curia, MD;  Location: [facility] OR;  Service: Orthopedics;  Laterality: Left;   INCISION AND DRAINAGE OF WOUND     boil on back/notes 07/14/2008 (07/29/2012)   INGUINAL HERNIA REPAIR  04/01/2012   Procedure: HERNIA REPAIR INGUINAL ADULT;  Surgeon: Sherlean JINNY Laughter, MD;  Location: [facility] OR;  Service: General;  Laterality: Right;   INGUINAL HERNIA REPAIR Left 09/06/2016   Procedure: OPEN REPAIR LEFT INGUINAL HERNIA;  Surgeon: Tanda Locus, MD;  Location: [facility] OR;  Service: General;  Laterality: Left;   INSERTION OF MESH Left 09/06/2016   Procedure: INSERTION OF MESH;  Surgeon: Tanda Locus, MD;  Location: [facility] OR;  Service: General;  Laterality: Left;   TONSILLECTOMY       Medications:   Current Facility-Administered Medications:    0.9 %  sodium chloride  infusion, , Intravenous, Continuous, Claudene Reeves A, MD, Last Rate: 100 mL/hr at 01/23/24 1412,  New Bag at 01/23/24 1412   acetaminophen  (TYLENOL ) tablet 650 mg, 650 mg, Oral, Q6H PRN **OR** acetaminophen  (TYLENOL ) suppository 650 mg, 650 mg, Rectal, Q6H PRN, Claudene, Rondell A, MD   atorvastatin  (LIPITOR) tablet 40 mg, 40 mg, Oral, Daily, Smith, Rondell A, MD, 40 mg at 01/24/24 1022   dextrose  50 % solution 0-50 mL, 0-50 mL, Intravenous, PRN, Tegeler, Lonni JINNY, MD   enoxaparin  (LOVENOX ) injection 40 mg, 40 mg, Subcutaneous, Q24H, Smith, Rondell A, MD, 40 mg at 01/23/24 1413   folic acid (FOLVITE) tablet 1 mg, 1 mg, Oral, Daily, Claudene, Rondell A, MD, 1 mg at 01/24/24 1022   gabapentin  (NEURONTIN ) capsule 300 mg, 300 mg, Oral, TID, Claudene, Rondell A, MD, 300 mg at 01/24/24 1022   insulin  aspart (novoLOG ) injection 0-15 Units, 0-15 Units, Subcutaneous, TID WC, Smith, Rondell A, MD, 8 Units at 01/24/24 9344   insulin  aspart (novoLOG ) injection 0-5 Units, 0-5 Units, Subcutaneous, QHS, Smith, Rondell A, MD, 3 Units at 01/23/24 2158   insulin  aspart (novoLOG ) injection 5 Units, 5 Units, Subcutaneous, TID WC, Amin, Sumayya, MD   insulin  glargine-yfgn (SEMGLEE ) injection 15 Units, 15 Units, Subcutaneous, Daily, Claudene, Rondell A, MD, 15 Units at 01/24/24 1022   LORazepam  (ATIVAN ) tablet 1-4 mg, 1-4 mg, Oral, Q1H PRN **OR** LORazepam  (ATIVAN ) injection 1-4 mg, 1-4 mg, Intravenous, Q1H PRN, Claudene, Rondell A, MD   magnesium  sulfate IVPB 2 g 50 mL, 2 g, Intravenous, Once, Caleen Qualia, MD, Last Rate: 50 mL/hr at 01/24/24 1021, 2 g at 01/24/24 1021   multivitamin with minerals tablet 1 tablet, 1 tablet, Oral, Daily, Claudene, Rondell A, MD, 1 tablet at 01/24/24 1022   nicotine  (NICODERM CQ  - dosed in mg/24 hours) patch 21 mg, 21 mg, Transdermal, Daily, Smith, Rondell A, MD   ondansetron  (ZOFRAN ) tablet 4 mg, 4 mg, Oral, Q6H PRN **OR** ondansetron  (ZOFRAN ) injection 4 mg, 4 mg, Intravenous, Q6H PRN, Claudene, Rondell A, MD   pantoprazole  (PROTONIX ) EC tablet 40 mg, 40 mg, Oral, Daily, Smith, Rondell A, MD, 40 mg  at 01/24/24 1022   sodium chloride  flush (NS) 0.9 % injection 3 mL, 3 mL, Intravenous, Q12H, Smith, Rondell A, MD, 3 mL at 01/24/24 1021   thiamine (VITAMIN B1) tablet 100 mg, 100 mg, Oral, Daily, 100 mg at 01/23/24 1937 **OR** thiamine (VITAMIN B1) injection 100 mg, 100 mg, Intravenous, Daily, Smith, Rondell A, MD   traZODone  (DESYREL ) tablet 50 mg, 50 mg, Oral, QHS PRN, Smith, Rondell A, MD, 50 mg at 01/23/24 8062  Allergies: Allergies  Allergen Reactions   Lisinopril  Anaphylaxis, Swelling and Other (See Comments)    Lip Swelling and angioedema, too     Jan DELENA Donath, MD

## 2024-01-24 NOTE — Discharge Instructions (Signed)

## 2024-01-24 NOTE — Discharge Summary (Signed)
 Physician Discharge Summary   Patient: David Wyatt MRN: [id number] DOB: 07-Aug-1960  Admit date:     01/23/2024  Discharge date: 01/24/24  Discharge Physician: Amaryllis Dare   PCP: Delbert Clam, MD   Recommendations at discharge:  Please obtain CBC and BMP on follow-up Please follow-up if he contacted the resources for substance abuse and alcohol cessation Follow-up with primary care provider within a week  Discharge Diagnoses: Principal Problem:   Hyperosmolar hyperglycemic state (HHS) (HCC) Active Problems:   Auditory hallucination   Depression with suicidal ideation   Diabetic neuropathy (HCC)   Hyperlipidemia   Housing insecurity   Polysubstance abuse (HCC)   Paranoia [facility])   Hospital Course: Partly taken from H&P.  David Wyatt is a 63 y.o. male with medical history significant of diabetes mellitus type 2 with neuropathy, polysubstance abuse presents with uncontrolled blood sugars and homelessness.   He has been experiencing difficulty controlling his blood sugars, with levels consistently rising. He has been unable to access his medications due to losing the key to his storage. Despite this, he continues to check his blood sugar levels, which have been high, with a recent reading of 660 mg/dL. Symptoms associated with his high blood sugar include frequent urination, inability to sleep, and extreme hunger.  Patient was also experiencing back pain, radiating to leg.  Admitted to auditory hallucinations, feels depressed with suicidal ideations, crack cocaine and to binge drinking when feeling down.  On presentation hemodynamically stable, blood glucose of 664, creatinine 1.28, CO2 24, VBG within normal limit.  Patient received IV fluid and initially started on insulin  infusion per Endo tool protocol.  Later he was transition to basal and short acting.  11/15: Vital stable, CBG still little elevated at 269,   Psych was also consulted.  Magnesium  of 1.7-replacing.  Psych  evaluated him and cleared him for discharge.  Per patient he does not want to hurt himself but make that statement to stay in the hospital as he is homeless.  He lost his medications, he was given all new medications including his insulin  from our [facility] pharmacy.  He was provided with resources for inpatient rehab programs for substance abuse as he seems interested.  Unfortunately we will not be able to get him to any of those programs from hospital.  Our social worker met him and provide him with all the resources he need.  Patient will continue on current medications and seems to be at his baseline now.  Patient need a close follow-up with his primary care provider for further assistance.  Consultants: Psychiatry Procedures performed: None Disposition: Home Diet recommendation:  Discharge Diet Orders (From admission, onward)     Start     Ordered   01/24/24 0000  Diet - low sodium heart healthy        01/24/24 1348           Cardiac and Carb modified diet DISCHARGE MEDICATION: Allergies as of 01/24/2024       Reactions   Lisinopril  Anaphylaxis, Swelling, Other (See Comments)   Lip Swelling and angioedema, too        Medication List     STOP taking these medications    lidocaine  5 % Commonly known as: Lidoderm    ondansetron  4 MG tablet Commonly known as: ZOFRAN    pantoprazole  40 MG tablet Commonly known as: PROTONIX        TAKE these medications    Accu-Chek Guide Me w/Device Kit Use to test blood sugar 3 times daily.  Accu-Chek Guide Test test strip Generic drug: glucose blood Use as instructed to check blood sugar three times daily What changed:  how much to take how to take this when to take this   Accu-Chek Softclix Lancets lancets Use to check blood sugar three times daily as directed   amLODipine  5 MG tablet Commonly known as: NORVASC  Take 1 tablet (5 mg total) by mouth daily. Start taking on: January 25, 2024   atorvastatin  40 MG  tablet Commonly known as: LIPITOR Take 1 tablet (40 mg total) by mouth daily.   DULoxetine  60 MG capsule Commonly known as: CYMBALTA  Take 1 capsule (60 mg total) by mouth at bedtime. What changed: You were already taking a medication with the same name, and this prescription was added. Make sure you understand how and when to take each.   DULoxetine  30 MG capsule Commonly known as: CYMBALTA  Take 1 capsule (30 mg total) by mouth in the morning. Start taking on: January 25, 2024 What changed:  how much to take when to take this   folic acid 1 MG tablet Commonly known as: FOLVITE Take 1 tablet (1 mg total) by mouth daily. Start taking on: January 25, 2024   gabapentin  300 MG capsule Commonly known as: NEURONTIN  Take 1 capsule (300 mg total) by mouth 3 (three) times daily.   insulin  aspart 100 UNIT/ML FlexPen Commonly known as: NOVOLOG  Inject 10 Units into the skin 3 (three) times daily with meals. What changed: how much to take   insulin  glargine 100 UNIT/ML Solostar Pen Commonly known as: LANTUS  Inject 15 Units into the skin at bedtime. What changed: how much to take   Insulin  Pen Needle 32G X 4 MM Misc Use 1 each to inject insulin  into the skin 4 (four) times daily -  before meals and at bedtime.   metFORMIN  1000 MG tablet Commonly known as: GLUCOPHAGE  Take 1 tablet (1,000 mg total) by mouth 2 (two) times daily with a meal.   multivitamin with minerals Tabs tablet Take 1 tablet by mouth daily. Start taking on: January 25, 2024   thiamine 100 MG tablet Commonly known as: Vitamin B-1 Take 1 tablet (100 mg total) by mouth daily. Start taking on: January 25, 2024   traZODone  50 MG tablet Commonly known as: DESYREL  Take 1 tablet (50 mg total) by mouth at bedtime as needed for sleep.        Follow-up Information     Delbert Clam, MD. Schedule an appointment as soon as possible for a visit in 1 week(s).   Specialty: Family Medicine Contact  information: [address] [place] [address] [phone]                Discharge Exam: [name]   01/23/24 1744  Weight: 72 kg   General.  Well-developed gentleman, in no acute distress. Pulmonary.  Lungs clear bilaterally, normal respiratory effort. CV.  Regular rate and rhythm, no JVD, rub or murmur. Abdomen.  Soft, nontender, nondistended, BS positive. CNS.  Alert and oriented .  No focal neurologic deficit. Extremities.  No edema, pulses intact and symmetrical. Psychiatry.  Judgment and insight appears normal.   Condition at discharge: stable  The results of significant diagnostics from this hospitalization (including imaging, microbiology, ancillary and laboratory) are listed below for reference.   Imaging Studies: No results found.  Microbiology: Results for orders placed or performed during the hospital encounter of 12/04/23  Gastrointestinal Panel by PCR , Stool  Status: None   Collection Time: 12/04/23  9:39 AM   Specimen: Stool  Result Value Ref Range Status   Campylobacter species NOT DETECTED NOT DETECTED Final   Plesimonas shigelloides NOT DETECTED NOT DETECTED Final   Salmonella species NOT DETECTED NOT DETECTED Final   Yersinia enterocolitica NOT DETECTED NOT DETECTED Final   Vibrio species NOT DETECTED NOT DETECTED Final   Vibrio cholerae NOT DETECTED NOT DETECTED Final   Enteroaggregative E coli (EAEC) NOT DETECTED NOT DETECTED Final   Enteropathogenic E coli (EPEC) NOT DETECTED NOT DETECTED Final   Enterotoxigenic E coli (ETEC) NOT DETECTED NOT DETECTED Final   Shiga like toxin producing E coli (STEC) NOT DETECTED NOT DETECTED Final   Shigella/Enteroinvasive E coli (EIEC) NOT DETECTED NOT DETECTED Final   Cryptosporidium NOT DETECTED NOT DETECTED Final   Cyclospora cayetanensis NOT DETECTED NOT DETECTED Final   Entamoeba histolytica NOT DETECTED NOT DETECTED Final   Giardia lamblia NOT DETECTED NOT DETECTED Final    Adenovirus F40/41 NOT DETECTED NOT DETECTED Final   Astrovirus NOT DETECTED NOT DETECTED Final   Norovirus GI/GII NOT DETECTED NOT DETECTED Final   Rotavirus A NOT DETECTED NOT DETECTED Final   Sapovirus (I, II, IV, and V) NOT DETECTED NOT DETECTED Final    Comment: Performed at [facility], [address] [place], [place] [address]    Labs: CBC: Recent Labs  Lab 01/18/24 1859 01/19/24 0420 01/19/24 1446 01/19/24 1514 01/20/24 1105 01/22/24 1038 01/22/24 1041 01/22/24 1946 01/22/24 1953 01/23/24 0754 01/23/24 1107 01/24/24 0237  WBC 6.1  --  4.8  --  5.0  --  5.6 6.8  --  5.1  --  6.3  NEUTROABS 3.4  --  3.1  --   --   --   --   --   --   --   --   --   HGB 12.9*   < > 12.9*   < > 13.8   < > 13.9 13.0 13.6 12.9* 13.3 12.2*  HCT 40.2   < > 40.0   < > 42.3   < > 41.8 39.1 40.0 39.9 39.0 36.6*  MCV 94.8  --  95.0  --  95.1  --  92.1 94.2  --  93.9  --  92.7  PLT 376  --  351  --  376  --  350 348  --  321  --  290   < > = values in this interval not displayed.   Basic Metabolic Panel: Recent Labs  Lab 01/23/24 0925 01/23/24 1107 01/23/24 1400 01/23/24 1736 01/23/24 2123 01/24/24 0237  NA 132* 134* 138 134* 137 135  K 4.9 4.4 3.9 4.1 3.9 3.8  CL 98  --  104 100 104 105  CO2 22  --  26 26 24 22   GLUCOSE 664*  --  95 407* 223* 265*  BUN 14  --  13 14 15 16   CREATININE 1.28*  --  1.10 1.11 1.09 1.06  CALCIUM  8.8*  --  9.1 8.4* 8.6* 8.1*  MG  --   --   --   --   --  1.7  PHOS  --   --   --   --   --  2.9   Liver Function Tests: Recent Labs  Lab 01/22/24 1041 01/23/24 0754  AST 78* 35  ALT 81* 58*  ALKPHOS 143* 103  BILITOT 1.0 1.1  PROT 7.8 6.9  ALBUMIN 3.6 3.1*  CBG: Recent Labs  Lab 01/23/24 1321 01/23/24 1737 01/23/24 2102 01/24/24 0611 01/24/24 1117  GLUCAP 115* 390* 254* 269* 272*    Discharge time spent: greater than 30 minutes.  This record has been created using Conservation officer, historic buildings. Errors have been sought and  corrected,but may not always be located. Such creation errors do not reflect on the standard of care.   Signed: Amaryllis Dare, MD [facility] 01/24/2024

## 2024-01-25 ENCOUNTER — Other Ambulatory Visit (HOSPITAL_COMMUNITY): Payer: Self-pay

## 2024-01-26 ENCOUNTER — Telehealth: Payer: Self-pay

## 2024-01-26 NOTE — Transitions of Care (Post Inpatient/ED Visit) (Signed)
   01/26/2024  Name: David Wyatt MRN: [id number] DOB: [birth year]  Today's TOC FU Call Status: Today's TOC FU Call Status:: Unsuccessful Call (1st Attempt) Unsuccessful Call (1st Attempt) Date: 01/26/24  The recording stated that the call cannot be completed dialed.   Attempted to reach the patient regarding the most recent Inpatient/ED visit.  Follow Up Plan: Additional outreach attempts will be made to reach the patient to complete the Transitions of Care (Post Inpatient/ED visit) call.   Signature  Slater Diesel, RN

## 2024-01-27 ENCOUNTER — Inpatient Hospital Stay (HOSPITAL_COMMUNITY)
Admission: EM | Admit: 2024-01-27 | Discharge: 2024-02-02 | DRG: 638 | Disposition: A | Discharge status: Home / Self Care | Payer: MEDICAID | Attending: Internal Medicine | Admitting: Internal Medicine

## 2024-01-27 ENCOUNTER — Other Ambulatory Visit: Payer: Self-pay

## 2024-01-27 ENCOUNTER — Encounter (HOSPITAL_COMMUNITY): Payer: Self-pay

## 2024-01-27 ENCOUNTER — Telehealth: Payer: Self-pay

## 2024-01-27 DIAGNOSIS — E1140 Type 2 diabetes mellitus with diabetic neuropathy, unspecified: Secondary | ICD-10-CM | POA: Diagnosis present

## 2024-01-27 DIAGNOSIS — N1831 Chronic kidney disease, stage 3a: Secondary | ICD-10-CM | POA: Diagnosis present

## 2024-01-27 DIAGNOSIS — Z720 Tobacco use: Secondary | ICD-10-CM | POA: Diagnosis present

## 2024-01-27 DIAGNOSIS — M19042 Primary osteoarthritis, left hand: Secondary | ICD-10-CM | POA: Diagnosis present

## 2024-01-27 DIAGNOSIS — I129 Hypertensive chronic kidney disease with stage 1 through stage 4 chronic kidney disease, or unspecified chronic kidney disease: Secondary | ICD-10-CM | POA: Diagnosis present

## 2024-01-27 DIAGNOSIS — G8929 Other chronic pain: Secondary | ICD-10-CM | POA: Diagnosis present

## 2024-01-27 DIAGNOSIS — Z833 Family history of diabetes mellitus: Secondary | ICD-10-CM | POA: Diagnosis not present

## 2024-01-27 DIAGNOSIS — E1122 Type 2 diabetes mellitus with diabetic chronic kidney disease: Secondary | ICD-10-CM | POA: Diagnosis present

## 2024-01-27 DIAGNOSIS — E860 Dehydration: Secondary | ICD-10-CM | POA: Diagnosis present

## 2024-01-27 DIAGNOSIS — R531 Weakness: Secondary | ICD-10-CM | POA: Diagnosis present

## 2024-01-27 DIAGNOSIS — R5383 Other fatigue: Secondary | ICD-10-CM

## 2024-01-27 DIAGNOSIS — F32A Depression, unspecified: Secondary | ICD-10-CM | POA: Diagnosis present

## 2024-01-27 DIAGNOSIS — E785 Hyperlipidemia, unspecified: Secondary | ICD-10-CM | POA: Diagnosis present

## 2024-01-27 DIAGNOSIS — Z91148 Patient's other noncompliance with medication regimen for other reason: Secondary | ICD-10-CM | POA: Diagnosis not present

## 2024-01-27 DIAGNOSIS — Z79899 Other long term (current) drug therapy: Secondary | ICD-10-CM

## 2024-01-27 DIAGNOSIS — E872 Acidosis, unspecified: Secondary | ICD-10-CM | POA: Diagnosis present

## 2024-01-27 DIAGNOSIS — K219 Gastro-esophageal reflux disease without esophagitis: Secondary | ICD-10-CM | POA: Diagnosis present

## 2024-01-27 DIAGNOSIS — M479 Spondylosis, unspecified: Secondary | ICD-10-CM | POA: Diagnosis present

## 2024-01-27 DIAGNOSIS — E7800 Pure hypercholesterolemia, unspecified: Secondary | ICD-10-CM | POA: Diagnosis present

## 2024-01-27 DIAGNOSIS — R739 Hyperglycemia, unspecified: Secondary | ICD-10-CM | POA: Diagnosis present

## 2024-01-27 DIAGNOSIS — E875 Hyperkalemia: Secondary | ICD-10-CM | POA: Diagnosis present

## 2024-01-27 DIAGNOSIS — Z888 Allergy status to other drugs, medicaments and biological substances status: Secondary | ICD-10-CM

## 2024-01-27 DIAGNOSIS — Z7984 Long term (current) use of oral hypoglycemic drugs: Secondary | ICD-10-CM | POA: Diagnosis not present

## 2024-01-27 DIAGNOSIS — Z8419 Family history of other disorders of kidney and ureter: Secondary | ICD-10-CM

## 2024-01-27 DIAGNOSIS — Z794 Long term (current) use of insulin: Secondary | ICD-10-CM

## 2024-01-27 DIAGNOSIS — E1100 Type 2 diabetes mellitus with hyperosmolarity without nonketotic hyperglycemic-hyperosmolar coma (NKHHC): Principal | ICD-10-CM | POA: Diagnosis present

## 2024-01-27 DIAGNOSIS — Z5900 Homelessness unspecified: Secondary | ICD-10-CM | POA: Diagnosis not present

## 2024-01-27 DIAGNOSIS — Z8349 Family history of other endocrine, nutritional and metabolic diseases: Secondary | ICD-10-CM

## 2024-01-27 DIAGNOSIS — R45851 Suicidal ideations: Secondary | ICD-10-CM | POA: Diagnosis present

## 2024-01-27 DIAGNOSIS — M19041 Primary osteoarthritis, right hand: Secondary | ICD-10-CM | POA: Diagnosis present

## 2024-01-27 DIAGNOSIS — F1910 Other psychoactive substance abuse, uncomplicated: Secondary | ICD-10-CM | POA: Diagnosis present

## 2024-01-27 DIAGNOSIS — I10 Essential (primary) hypertension: Secondary | ICD-10-CM | POA: Diagnosis present

## 2024-01-27 DIAGNOSIS — E1165 Type 2 diabetes mellitus with hyperglycemia: Principal | ICD-10-CM

## 2024-01-27 LAB — I-STAT VENOUS BLOOD GAS, ED
Acid-base deficit: 1 mmol/L (ref 0.0–2.0)
Bicarbonate: 24.4 mmol/L (ref 20.0–28.0)
Calcium, Ion: 1.18 mmol/L (ref 1.15–1.40)
HCT: 40 % (ref 39.0–52.0)
Hemoglobin: 13.6 g/dL (ref 13.0–17.0)
O2 Saturation: 97 %
Potassium: 5 mmol/L (ref 3.5–5.1)
Sodium: 133 mmol/L — ABNORMAL LOW (ref 135–145)
TCO2: 26 mmol/L (ref 22–32)
pCO2, Ven: 40.2 mmHg — ABNORMAL LOW (ref 44–60)
pH, Ven: 7.391 (ref 7.25–7.43)
pO2, Ven: 87 mmHg — ABNORMAL HIGH (ref 32–45)

## 2024-01-27 LAB — CBC WITH DIFFERENTIAL/PLATELET
Abs Immature Granulocytes: 0.02 K/uL (ref 0.00–0.07)
Basophils Absolute: 0.1 K/uL (ref 0.0–0.1)
Basophils Relative: 1 %
Eosinophils Absolute: 0.2 K/uL (ref 0.0–0.5)
Eosinophils Relative: 4 %
HCT: 40 % (ref 39.0–52.0)
Hemoglobin: 13.1 g/dL (ref 13.0–17.0)
Immature Granulocytes: 0 %
Lymphocytes Relative: 29 %
Lymphs Abs: 1.9 K/uL (ref 0.7–4.0)
MCH: 31.3 pg (ref 26.0–34.0)
MCHC: 32.8 g/dL (ref 30.0–36.0)
MCV: 95.5 fL (ref 80.0–100.0)
Monocytes Absolute: 0.4 K/uL (ref 0.1–1.0)
Monocytes Relative: 7 %
Neutro Abs: 3.9 K/uL (ref 1.7–7.7)
Neutrophils Relative %: 59 %
Platelets: 333 K/uL (ref 150–400)
RBC: 4.19 MIL/uL — ABNORMAL LOW (ref 4.22–5.81)
RDW: 13.2 % (ref 11.5–15.5)
WBC: 6.6 K/uL (ref 4.0–10.5)
nRBC: 0 % (ref 0.0–0.2)

## 2024-01-27 LAB — COMPREHENSIVE METABOLIC PANEL WITH GFR
ALT: 44 U/L (ref 0–44)
AST: 32 U/L (ref 15–41)
Albumin: 3.4 g/dL — ABNORMAL LOW (ref 3.5–5.0)
Alkaline Phosphatase: 127 U/L — ABNORMAL HIGH (ref 38–126)
Anion gap: 15 (ref 5–15)
BUN: 17 mg/dL (ref 8–23)
CO2: 21 mmol/L — ABNORMAL LOW (ref 22–32)
Calcium: 9.3 mg/dL (ref 8.9–10.3)
Chloride: 97 mmol/L — ABNORMAL LOW (ref 98–111)
Creatinine, Ser: 1.38 mg/dL — ABNORMAL HIGH (ref 0.61–1.24)
GFR, Estimated: 57 mL/min — ABNORMAL LOW (ref >60)
Glucose, Bld: 752 mg/dL (ref 70–99)
Potassium: 5.3 mmol/L — ABNORMAL HIGH (ref 3.5–5.1)
Sodium: 133 mmol/L — ABNORMAL LOW (ref 135–145)
Total Bilirubin: 0.9 mg/dL (ref 0.0–1.2)
Total Protein: 7 g/dL (ref 6.5–8.1)

## 2024-01-27 LAB — URINALYSIS, ROUTINE W REFLEX MICROSCOPIC
Bacteria, UA: NONE SEEN
Bilirubin Urine: NEGATIVE
Glucose, UA: >=500 mg/dL — AB
Hgb urine dipstick: NEGATIVE
Ketones, ur: NEGATIVE mg/dL
Leukocytes,Ua: NEGATIVE
Nitrite: NEGATIVE
Protein, ur: NEGATIVE mg/dL
Specific Gravity, Urine: 1.026 (ref 1.005–1.030)
pH: 7 (ref 5.0–8.0)

## 2024-01-27 LAB — CBG MONITORING, ED
Glucose-Capillary: 426 mg/dL — ABNORMAL HIGH (ref 70–99)
Glucose-Capillary: 509 mg/dL (ref 70–99)
Glucose-Capillary: >600 mg/dL (ref 70–99)
Glucose-Capillary: >600 mg/dL (ref 70–99)

## 2024-01-27 LAB — LIPASE, BLOOD: Lipase: 35 U/L (ref 11–51)

## 2024-01-27 LAB — I-STAT CG4 LACTIC ACID, ED
Lactic Acid, Venous: 3.2 mmol/L (ref 0.5–1.9)
Lactic Acid, Venous: 1.9 mmol/L (ref 0.5–1.9)

## 2024-01-27 LAB — BETA-HYDROXYBUTYRIC ACID: Beta-Hydroxybutyric Acid: 0.15 mmol/L (ref 0.05–0.27)

## 2024-01-27 MED ORDER — INSULIN REGULAR(HUMAN) IN NACL 100-0.9 UT/100ML-% IV SOLN
INTRAVENOUS | Status: DC
  Administered 2024-01-27: 13 [IU]/h via INTRAVENOUS

## 2024-01-27 MED ORDER — SODIUM CHLORIDE 0.9 % IV BOLUS
1000.0000 mL | Freq: Once | INTRAVENOUS | Status: AC
  Administered 2024-01-27: 1000 mL via INTRAVENOUS

## 2024-01-27 MED ORDER — DEXTROSE IN LACTATED RINGERS 5 % IV SOLN: INTRAVENOUS | Status: DC

## 2024-01-27 MED ORDER — LACTATED RINGERS IV SOLN: INTRAVENOUS | Status: DC

## 2024-01-27 MED ORDER — DEXTROSE 50 % IV SOLN: 0.0000 mL | INTRAVENOUS | Status: DC | PRN

## 2024-01-27 MED ORDER — INSULIN REGULAR(HUMAN) IN NACL 100-0.9 UT/100ML-% IV SOLN
INTRAVENOUS | Status: AC
  Filled 2024-01-27: qty 100

## 2024-01-27 NOTE — Transitions of Care (Post Inpatient/ED Visit) (Signed)
   01/27/2024  Name: David Wyatt MRN: [id number] DOB: February 07, 1961  Today's TOC FU Call Status: Today's TOC FU Call Status:: Unsuccessful Call (2nd Attempt) Unsuccessful Call (1st Attempt) Date: 01/26/24 Unsuccessful Call (2nd Attempt) Date: 01/27/24  Attempted to reach the patient regarding the most recent Inpatient/ED visit.  Follow Up Plan: Additional outreach attempts will be made to reach the patient to complete the Transitions of Care (Post Inpatient/ED visit) call.   The recording stated that the call cannot be completed as dialed.   Signature Slater Diesel, RN

## 2024-01-27 NOTE — ED Triage Notes (Addendum)
 [place] c.[place] not feeling well and thirsty  CBG 439

## 2024-01-27 NOTE — ED Provider Triage Note (Signed)
 Emergency Medicine Provider Triage Evaluation Note  David Wyatt , a 63 y.o. male  was evaluated in triage.  Hx of homelessness and diabetes noncompliant with medications brought in by ems [place] for generalized weakness, feelings of unwellness, polydipsia, and polyuria. Glucose >600 on arrival. Denies fevers, chills, nausea, vomiting, diarrhea, or uri symptoms.   Review of Systems  Positive: Please see above Negative: -  Physical Exam  BP (!) 149/86   Pulse 83   Temp 98.4 F (36.9 C)   Resp 16   SpO2 96%  Gen:   Awake, no distress. Generalized weakness but able to follow commands and move all four ext.  Resp:  Normal effort  MSK:   Moves extremities without difficulty  Other:  Dry skin, dry mouth  Medical Decision Making  Medically screening exam initiated at 6:27 PM.  Appropriate orders placed.  David Wyatt was informed that the remainder of the evaluation will be completed by another provider, this initial triage assessment does not replace that evaluation, and the importance of remaining in the ED until their evaluation is complete.  Hyperglycemic, r/o dka R/o underlying infection   [name], DO 01/27/24 1829

## 2024-01-27 NOTE — ED Provider Notes (Addendum)
 [place] EMERGENCY DEPARTMENT AT [facility] Provider Note   CSN: [id number] Arrival date & time: 01/27/24  1638     Patient presents with: Fatigue   David Wyatt is a 63 y.o. male who presents to the emergency department with a chief complaint of overall fatigue as well as increased thirst.  Patient was discharged from the hospital approximately 4 days ago and was seen for suicidal ideations, hyperglycemia, as well as hallucinations.  Patient has numerous visits for hyperglycemia normally resulting in discharge.  Patient states he has been compliant with all of his outpatient medications including his diabetic medications.  Difficult history to obtain from patient, but states that he became very fatigued while at the bus stop and called EMS.  Initial blood glucose noted to be 439 by EMS.  Per EMS run sheet patient initially reported that he had not had his medication today and that he does not feel well because he does not have water and wants water.  Past medical history significant for crack cocaine use, homelessness, chronic back pain, hypertension, diabetic neuropathy, GERD, tobacco abuse, type 2 diabetes, acute respiratory failure with hypoxia, hyperglycemia, deficient knowledge of community resources, HHS, etc.    HPI     Prior to Admission medications   Medication Sig Start Date End Date Taking? Authorizing Provider  Accu-Chek Softclix Lancets lancets Use to check blood sugar three times daily as directed 01/24/24   Caleen Qualia, MD  amLODipine  (NORVASC ) 5 MG tablet Take 1 tablet (5 mg total) by mouth daily. 01/25/24   Caleen Qualia, MD  atorvastatin  (LIPITOR) 40 MG tablet Take 1 tablet (40 mg total) by mouth daily. 01/24/24   Amin, Sumayya, MD  Blood Glucose Monitoring Suppl (ACCU-CHEK GUIDE ME) w/Device KIT Use to test blood sugar 3 times daily. 11/01/23   Shrivastava, Aryendra, MD  DULoxetine  (CYMBALTA ) 30 MG capsule Take 1 capsule (30 mg total) by mouth in the  morning. 01/25/24   Amin, Sumayya, MD  DULoxetine  (CYMBALTA ) 60 MG capsule Take 1 capsule (60 mg total) by mouth at bedtime. 01/24/24   Caleen Qualia, MD  folic acid  (FOLVITE ) 1 MG tablet Take 1 tablet (1 mg total) by mouth daily. 01/25/24   Caleen Qualia, MD  gabapentin  (NEURONTIN ) 300 MG capsule Take 1 capsule (300 mg total) by mouth 3 (three) times daily. 01/24/24   Amin, Sumayya, MD  glucose blood (ACCU-CHEK GUIDE TEST) test strip Use as instructed to check blood sugar three times daily 01/24/24   Amin, Sumayya, MD  insulin  aspart (NOVOLOG ) 100 UNIT/ML FlexPen Inject 10 Units into the skin 3 (three) times daily with meals. Pen expires 28 days after first use 01/24/24   Amin, Sumayya, MD  insulin  glargine (LANTUS ) 100 UNIT/ML Solostar Pen Inject 15 Units into the skin at bedtime. 01/24/24   Caleen Qualia, MD  Insulin  Pen Needle 32G X 4 MM MISC Use 1 each to inject insulin  into the skin 4 (four) times daily -  before meals and at bedtime. 01/24/24   Amin, Sumayya, MD  metFORMIN  (GLUCOPHAGE ) 1000 MG tablet Take 1 tablet (1,000 mg total) by mouth 2 (two) times daily with a meal. 01/24/24   Caleen Qualia, MD  Multiple Vitamin (MULTIVITAMIN WITH MINERALS) TABS tablet Take 1 tablet by mouth daily. 01/25/24   Caleen Qualia, MD  thiamine  (VITAMIN B1) 100 MG tablet Take 1 tablet (100 mg total) by mouth daily. 01/25/24   Caleen Qualia, MD  traZODone  (DESYREL ) 50 MG tablet Take 1 tablet (50 mg  total) by mouth at bedtime as needed for sleep. 01/24/24   Caleen Qualia, MD    Allergies: Lisinopril     Review of Systems  Constitutional:  Positive for fatigue.    Updated Vital Signs BP (!) 168/89 (BP Location: Right Arm)   Pulse 92   Temp 98.4 F (36.9 C) (Oral)   Resp (!) 24   Ht 5' 8 (1.727 m)   Wt 75 kg   SpO2 100%   BMI 25.14 kg/m   Physical Exam Vitals and nursing note reviewed.  Constitutional:      General: He is awake. He is not in acute distress.    Appearance: Normal appearance. He is  not ill-appearing, toxic-appearing or diaphoretic.  HENT:     Head: Normocephalic and atraumatic.  Eyes:     General: No scleral icterus. Cardiovascular:     Rate and Rhythm: Normal rate and regular rhythm.  Pulmonary:     Effort: Pulmonary effort is normal. No respiratory distress.     Breath sounds: Normal breath sounds. No wheezing, rhonchi or rales.  Musculoskeletal:        General: Normal range of motion.     Right lower leg: No edema.     Left lower leg: No edema.     Comments: Patient able to raise bilateral upper and lower extremities off the bed against resistance as well as gravity, equal grip strength bilaterally  Patient able to get himself out of bed and stand at bedside without assistance  Skin:    General: Skin is warm.     Capillary Refill: Capillary refill takes less than 2 seconds.  Neurological:     General: No focal deficit present.     Mental Status: He is alert and oriented to person, place, and time.  Psychiatric:        Mood and Affect: Mood normal.        Behavior: Behavior normal. Behavior is cooperative.     (all labs ordered are listed, but only abnormal results are displayed) Labs Reviewed  CBC WITH DIFFERENTIAL/PLATELET - Abnormal; Notable for the following components:      Result Value   RBC 4.19 (*)    All other components within normal limits  COMPREHENSIVE METABOLIC PANEL WITH GFR - Abnormal; Notable for the following components:   Sodium 133 (*)    Potassium 5.3 (*)    Chloride 97 (*)    CO2 21 (*)    Glucose, Bld 752 (*)    Creatinine, Ser 1.38 (*)    Albumin 3.4 (*)    Alkaline Phosphatase 127 (*)    GFR, Estimated 57 (*)    All other components within normal limits  URINALYSIS, ROUTINE W REFLEX MICROSCOPIC - Abnormal; Notable for the following components:   Color, Urine STRAW (*)    Glucose, UA >=500 (*)    All other components within normal limits  CBG MONITORING, ED - Abnormal; Notable for the following components:    Glucose-Capillary >600 (*)    All other components within normal limits  I-STAT VENOUS BLOOD GAS, ED - Abnormal; Notable for the following components:   pCO2, Ven 40.2 (*)    pO2, Ven 87 (*)    Sodium 133 (*)    All other components within normal limits  I-STAT CG4 LACTIC ACID, ED - Abnormal; Notable for the following components:   Lactic Acid, Venous 3.2 (*)    All other components within normal limits  CBG MONITORING, ED - Abnormal; Notable  for the following components:   Glucose-Capillary >600 (*)    All other components within normal limits  CBG MONITORING, ED - Abnormal; Notable for the following components:   Glucose-Capillary 509 (*)    All other components within normal limits  CBG MONITORING, ED - Abnormal; Notable for the following components:   Glucose-Capillary 426 (*)    All other components within normal limits  LIPASE, BLOOD  BETA-HYDROXYBUTYRIC ACID  I-STAT CG4 LACTIC ACID, ED    EKG: EKG Interpretation Date/Time:  Tuesday January 27 2024 22:51:32 EST Ventricular Rate:  68 PR Interval:  156 QRS Duration:  95 QT Interval:  417 QTC Calculation: 444 R Axis:   34  Text Interpretation: Sinus rhythm Probable left atrial enlargement Anteroseptal infarct, age indeterminate Lateral leads are also involved Confirmed by Darra Chew [phone]) on 01/27/2024 10:54:19 PM  Radiology: No results found.   .Critical Care  Performed by: Janetta Terrall FALCON, PA-C Authorized by: Janetta Terrall FALCON, PA-C   Critical care provider statement:    Critical care time (minutes):  30   Critical care was necessary to treat or prevent imminent or life-threatening deterioration of the following conditions:  Endocrine crisis   Critical care was time spent personally by me on the following activities:  Blood draw for specimens, development of treatment plan with patient or surrogate, discussions with consultants, examination of patient, interpretation of cardiac output measurements, obtaining  history from patient or surrogate, ordering and performing treatments and interventions, ordering and review of laboratory studies, pulse oximetry, re-evaluation of patient's condition and review of old charts   Care discussed with: admitting provider      Medications Ordered in the ED  insulin  regular, human (MYXREDLIN ) 100 units/ 100 mL infusion (8 Units/hr Intravenous Rate/Dose Change 01/27/24 2349)  lactated ringers  infusion ( Intravenous New Bag/Given 01/27/24 2227)  dextrose  5 % in lactated ringers  infusion ( Intravenous Not Given 01/27/24 2217)  dextrose  50 % solution 0-50 mL (has no administration in time range)  sodium chloride  0.9 % bolus 1,000 mL (0 mLs Intravenous Stopped 01/27/24 2111)                                    Medical Decision Making Risk Prescription drug management. Decision regarding hospitalization.   Patient presents to the ED for concern of fatigue, hyperglycemia, this involves an extensive number of treatment options, and is a complaint that carries with it a high risk of complications and morbidity.  The differential diagnosis includes DKA, HHS, hyperglycemia, viral syndrome, sepsis, etc.   Co morbidities that complicate the patient evaluation  crack cocaine use, homelessness, chronic back pain, hypertension, diabetic neuropathy, GERD, tobacco abuse, type 2 diabetes, acute respiratory failure with hypoxia, hyperglycemia, deficient knowledge of community resources, HHS   Additional history obtained:  Reviewed numerous previous notes where patient has been seen for hyperglycemia and similar complaints, reviewed previous admission from 01/23/2024  Lab Tests:  I Ordered, and personally interpreted labs.  The pertinent results include: CBC unremarkable, no elevated white blood cell count, hemoglobin appears stable, CMP remarkable for slightly decreased sodium, slightly increased potassium, glucose of 752, creatinine 1.38, initial lactic acid elevated at  3.2 improved to 1.9, urinalysis significant for extensive glucose, VBG shows pH of 7.39, beta hydroxybutyric acid 0.15   Cardiac Monitoring:  The patient was maintained on a cardiac monitor.  I personally viewed and interpreted the cardiac monitored which showed an  underlying rhythm of: sinus rhythm   Medicines ordered and prescription drug management:  I ordered medication including fluids and insulin  for hyperglycemia Reevaluation of the patient after these medicines showed that the patient improved I have reviewed the patients home medicines and have made adjustments as needed   Test Considered:  None   Critical Interventions:  None   Consultations Obtained:  I requested consultation with the hospitalist team,  and discussed lab and imaging findings as well as pertinent plan - they recommend: Admission to the hospital for ongoing diagnosis and treatment   Problem List / ED Course:  63 year old male, vital signs stable, presents to the emergency department with chief complaint of fatigue, patient noticeably hyperglycemic from labs at triage with a blood glucose of 752, also mildly elevated creatinine at 1.38, potassium 5.3, difficult history to obtain but EMS run sheet reviewed Patient does not have apparent altered mental status at time of my initial evaluation, only complaining of weakness and fatigue however patient is able to get himself out of bed and hold all 4 extremities up off the bed against resistance as well as gravity, no facial droop or slurred speech No elevated anion gap, pH on VBG 7.39, beta hydroxybutyric acid not elevated, due to this doubt DKA and due to no altered mental status doubt HHS, will treat hyperglycemia with fluids as well as insulin  Consult made to hospitalist, at this time patient is still hyperglycemic with blood sugar of 426 however lactic acidosis has improved Spoke with Dr. Sim with the hospitalist team who agrees with admission for  hyperglycemia in the setting of lactic acidosis   Reevaluation:  After the interventions noted above, I reevaluated the patient and found that they have :improved   Social Determinants of Health:  Homeless, medication non-compliance   Dispostion:  After consideration of the diagnostic results and the patients response to treatment, I feel that the patient would benefit from admission to the hospital for ongoing diagnosis and treatment     Final diagnoses:  Hyperglycemia due to diabetes mellitus (HCC)  Fatigue, unspecified type    ED Discharge Orders     None          Janetta Terrall FALCON, PA-C 01/28/24 0011    Janetta Terrall FALCON, PA-C 01/28/24 0125    Long, Joshua G, MD 01/31/24 [phone]

## 2024-01-27 NOTE — ED Provider Notes (Incomplete)
 [place] EMERGENCY DEPARTMENT AT [facility] Provider Note   CSN: [id number] Arrival date & time: 01/27/24  1638     Patient presents with: Fatigue   David Wyatt is a 63 y.o. male who presents to the emergency department with a chief complaint of overall fatigue as well as increased thirst.  Patient was just discharged in the hospital approximately 4 days ago and was seen for suicidal ideations, hyperglycemia, as well as hallucinations.  Patient has numerous visits for hyperglycemia normally resulting in discharge.  Patient states he has been compliant with all of his outpatient medications including his diabetic medications.  Difficult history to obtain from patient.  Past medical history significant for crack cocaine use, homelessness, chronic back pain, hypertension, diabetic neuropathy, GERD, tobacco abuse, type 2 diabetes, acute respiratory failure with hypoxia, hyperglycemia, deficient knowledge of community resources, HHS, etc.   {Add pertinent medical, surgical, social history, OB history to HPI:32947} HPI     Prior to Admission medications   Medication Sig Start Date End Date Taking? Authorizing Provider  Accu-Chek Softclix Lancets lancets Use to check blood sugar three times daily as directed 01/24/24   Amin, Sumayya, MD  amLODipine  (NORVASC ) 5 MG tablet Take 1 tablet (5 mg total) by mouth daily. 01/25/24   Caleen Qualia, MD  atorvastatin  (LIPITOR) 40 MG tablet Take 1 tablet (40 mg total) by mouth daily. 01/24/24   Amin, Sumayya, MD  Blood Glucose Monitoring Suppl (ACCU-CHEK GUIDE ME) w/Device KIT Use to test blood sugar 3 times daily. 11/01/23   Shrivastava, Aryendra, MD  DULoxetine  (CYMBALTA ) 30 MG capsule Take 1 capsule (30 mg total) by mouth in the morning. 01/25/24   Amin, Sumayya, MD  DULoxetine  (CYMBALTA ) 60 MG capsule Take 1 capsule (60 mg total) by mouth at bedtime. 01/24/24   Amin, Sumayya, MD  folic acid (FOLVITE) 1 MG tablet Take 1 tablet (1 mg total) by  mouth daily. 01/25/24   Amin, Sumayya, MD  gabapentin  (NEURONTIN ) 300 MG capsule Take 1 capsule (300 mg total) by mouth 3 (three) times daily. 01/24/24   Amin, Sumayya, MD  glucose blood (ACCU-CHEK GUIDE TEST) test strip Use as instructed to check blood sugar three times daily 01/24/24   Amin, Sumayya, MD  insulin  aspart (NOVOLOG ) 100 UNIT/ML FlexPen Inject 10 Units into the skin 3 (three) times daily with meals. Pen expires 28 days after first use 01/24/24   Amin, Sumayya, MD  insulin  glargine (LANTUS ) 100 UNIT/ML Solostar Pen Inject 15 Units into the skin at bedtime. 01/24/24   Caleen Qualia, MD  Insulin  Pen Needle 32G X 4 MM MISC Use 1 each to inject insulin  into the skin 4 (four) times daily -  before meals and at bedtime. 01/24/24   Amin, Sumayya, MD  metFORMIN  (GLUCOPHAGE ) 1000 MG tablet Take 1 tablet (1,000 mg total) by mouth 2 (two) times daily with a meal. 01/24/24   Caleen Qualia, MD  Multiple Vitamin (MULTIVITAMIN WITH MINERALS) TABS tablet Take 1 tablet by mouth daily. 01/25/24   Amin, Sumayya, MD  thiamine (VITAMIN B1) 100 MG tablet Take 1 tablet (100 mg total) by mouth daily. 01/25/24   Amin, Sumayya, MD  traZODone  (DESYREL ) 50 MG tablet Take 1 tablet (50 mg total) by mouth at bedtime as needed for sleep. 01/24/24   Caleen Qualia, MD    Allergies: Lisinopril     Review of Systems  Constitutional:  Positive for fatigue.    Updated Vital Signs BP (!) 149/82   Pulse 65  Temp 98.2 F (36.8 C)   Resp 13   SpO2 96%   Physical Exam Vitals and nursing note reviewed.  Constitutional:      General: He is awake. He is not in acute distress.    Appearance: Normal appearance. He is not ill-appearing, toxic-appearing or diaphoretic.  HENT:     Head: Normocephalic and atraumatic.  Eyes:     General: No scleral icterus. Cardiovascular:     Rate and Rhythm: Normal rate and regular rhythm.  Pulmonary:     Effort: Pulmonary effort is normal. No respiratory distress.     Breath  sounds: Normal breath sounds. No wheezing, rhonchi or rales.  Musculoskeletal:        General: Normal range of motion.     Right lower leg: No edema.     Left lower leg: No edema.     Comments: Patient able to raise bilateral upper and lower extremities off the bed against resistance as well as gravity, equal grip strength bilaterally  Patient able to get himself out of bed and stand at bedside without assistance  Skin:    General: Skin is warm.     Capillary Refill: Capillary refill takes less than 2 seconds.  Neurological:     General: No focal deficit present.     Mental Status: He is alert and oriented to person, place, and time.  Psychiatric:        Mood and Affect: Mood normal.        Behavior: Behavior normal. Behavior is cooperative.     (all labs ordered are listed, but only abnormal results are displayed) Labs Reviewed  CBC WITH DIFFERENTIAL/PLATELET - Abnormal; Notable for the following components:      Result Value   RBC 4.19 (*)    All other components within normal limits  COMPREHENSIVE METABOLIC PANEL WITH GFR - Abnormal; Notable for the following components:   Sodium 133 (*)    Potassium 5.3 (*)    Chloride 97 (*)    CO2 21 (*)    Glucose, Bld 752 (*)    Creatinine, Ser 1.38 (*)    Albumin 3.4 (*)    Alkaline Phosphatase 127 (*)    GFR, Estimated 57 (*)    All other components within normal limits  URINALYSIS, ROUTINE W REFLEX MICROSCOPIC - Abnormal; Notable for the following components:   Color, Urine STRAW (*)    Glucose, UA >=500 (*)    All other components within normal limits  CBG MONITORING, ED - Abnormal; Notable for the following components:   Glucose-Capillary >600 (*)    All other components within normal limits  I-STAT VENOUS BLOOD GAS, ED - Abnormal; Notable for the following components:   pCO2, Ven 40.2 (*)    pO2, Ven 87 (*)    Sodium 133 (*)    All other components within normal limits  I-STAT CG4 LACTIC ACID, ED - Abnormal; Notable for  the following components:   Lactic Acid, Venous 3.2 (*)    All other components within normal limits  CBG MONITORING, ED - Abnormal; Notable for the following components:   Glucose-Capillary >600 (*)    All other components within normal limits  CBG MONITORING, ED - Abnormal; Notable for the following components:   Glucose-Capillary 509 (*)    All other components within normal limits  LIPASE, BLOOD  BETA-HYDROXYBUTYRIC ACID  I-STAT CG4 LACTIC ACID, ED    EKG: EKG Interpretation Date/Time:  Tuesday January 27 2024 22:51:32 EST Ventricular  Rate:  68 PR Interval:  156 QRS Duration:  95 QT Interval:  417 QTC Calculation: 444 R Axis:   34  Text Interpretation: Sinus rhythm Probable left atrial enlargement Anteroseptal infarct, age indeterminate Lateral leads are also involved Confirmed by Darra Chew [phone]) on 01/27/2024 10:54:19 PM  Radiology: No results found.  {Document cardiac monitor, telemetry assessment procedure when appropriate:32947} Procedures   Medications Ordered in the ED  insulin  regular, human (MYXREDLIN ) 100 units/ 100 mL infusion (9 Units/hr Intravenous Rate/Dose Change 01/27/24 2308)  lactated ringers  infusion ( Intravenous New Bag/Given 01/27/24 2227)  dextrose  5 % in lactated ringers  infusion ( Intravenous Not Given 01/27/24 2217)  dextrose  50 % solution 0-50 mL (has no administration in time range)  sodium chloride  0.9 % bolus 1,000 mL (0 mLs Intravenous Stopped 01/27/24 2111)      {Click here for ABCD2, HEART and other calculators REFRESH Note before signing:1}                              Medical Decision Making Risk Prescription drug management.   Patient presents to the ED for concern of fatigue, hyperglycemia, this involves an extensive number of treatment options, and is a complaint that carries with it a high risk of complications and morbidity.  The differential diagnosis includes DKA, HHS, hyperglycemia, viral syndrome, sepsis,  etc.   Co morbidities that complicate the patient evaluation  crack cocaine use, homelessness, chronic back pain, hypertension, diabetic neuropathy, GERD, tobacco abuse, type 2 diabetes, acute respiratory failure with hypoxia, hyperglycemia, deficient knowledge of community resources, HHS   Additional history obtained:  Reviewed numerous previous notes where patient has been seen for hyperglycemia and similar complaints, reviewed previous admission from 01/23/2024  Lab Tests:  I Ordered, and personally interpreted labs.  The pertinent results include: CBC unremarkable, no elevated white blood cell count, hemoglobin appears stable, CMP remarkable for slightly decreased sodium, slightly increased potassium, glucose of 752, creatinine 1.38, initial lactic acid elevated at 3.2 improved to 1.9, urinalysis significant for extensive glucose, VBG shows pH of 7.39, beta hydroxybutyric acid 0.15   Cardiac Monitoring:  The patient was maintained on a cardiac monitor.  I personally viewed and interpreted the cardiac monitored which showed an underlying rhythm of: sinus rhythm   Medicines ordered and prescription drug management:  I ordered medication including fluids and insulin  for hyperglycemia Reevaluation of the patient after these medicines showed that the patient improved I have reviewed the patients home medicines and have made adjustments as needed   Test Considered:  None   Critical Interventions:  None   Consultations Obtained:  I requested consultation with the hospitalist team,  and discussed lab and imaging findings as well as pertinent plan - they recommend: ***   Problem List / ED Course:  63 year old male, vital signs stable, presents emergency department with chief complaint of fatigue, patient noticeably hyperglycemic from labs at triage with a blood glucose of 752, also mildly elevated creatinine at 1.38, potassium 5.3   Reevaluation:  After the interventions  noted above, I reevaluated the patient and found that they have :{resolved/improved/worsened:23923::improved}   Social Determinants of Health:  ***   Dispostion:  After consideration of the diagnostic results and the patients response to treatment, I feel that the patent would benefit from ***.    {Document critical care time when appropriate  Document review of labs and clinical decision tools ie CHADS2VASC2, etc  Document your independent  review of radiology images and any outside records  Document your discussion with family members, caretakers and with consultants  Document social determinants of health affecting pt's care  Document your decision making why or why not admission, treatments were needed:32947:::1}   Final diagnoses:  None    ED Discharge Orders     None

## 2024-01-28 DIAGNOSIS — E1100 Type 2 diabetes mellitus with hyperosmolarity without nonketotic hyperglycemic-hyperosmolar coma (NKHHC): Secondary | ICD-10-CM | POA: Diagnosis not present

## 2024-01-28 LAB — BASIC METABOLIC PANEL WITH GFR
Anion gap: 12 (ref 5–15)
BUN: 17 mg/dL (ref 8–23)
CO2: 22 mmol/L (ref 22–32)
Calcium: 9.3 mg/dL (ref 8.9–10.3)
Chloride: 108 mmol/L (ref 98–111)
Creatinine, Ser: 1.02 mg/dL (ref 0.61–1.24)
GFR, Estimated: >60 mL/min (ref >60)
Glucose, Bld: 151 mg/dL — ABNORMAL HIGH (ref 70–99)
Potassium: 3.9 mmol/L (ref 3.5–5.1)
Sodium: 142 mmol/L (ref 135–145)

## 2024-01-28 LAB — CBG MONITORING, ED
Glucose-Capillary: 137 mg/dL — ABNORMAL HIGH (ref 70–99)
Glucose-Capillary: 140 mg/dL — ABNORMAL HIGH (ref 70–99)
Glucose-Capillary: 144 mg/dL — ABNORMAL HIGH (ref 70–99)
Glucose-Capillary: 195 mg/dL — ABNORMAL HIGH (ref 70–99)
Glucose-Capillary: 195 mg/dL — ABNORMAL HIGH (ref 70–99)
Glucose-Capillary: 214 mg/dL — ABNORMAL HIGH (ref 70–99)
Glucose-Capillary: 272 mg/dL — ABNORMAL HIGH (ref 70–99)
Glucose-Capillary: 310 mg/dL — ABNORMAL HIGH (ref 70–99)
Glucose-Capillary: 392 mg/dL — ABNORMAL HIGH (ref 70–99)

## 2024-01-28 LAB — GLUCOSE, CAPILLARY: Glucose-Capillary: 189 mg/dL — ABNORMAL HIGH (ref 70–99)

## 2024-01-28 LAB — CBC
HCT: 39.8 % (ref 39.0–52.0)
Hemoglobin: 12.9 g/dL — ABNORMAL LOW (ref 13.0–17.0)
MCH: 30.8 pg (ref 26.0–34.0)
MCHC: 32.4 g/dL (ref 30.0–36.0)
MCV: 95 fL (ref 80.0–100.0)
Platelets: 304 K/uL (ref 150–400)
RBC: 4.19 MIL/uL — ABNORMAL LOW (ref 4.22–5.81)
RDW: 13.1 % (ref 11.5–15.5)
WBC: 8.3 K/uL (ref 4.0–10.5)
nRBC: 0 % (ref 0.0–0.2)

## 2024-01-28 LAB — OSMOLALITY: Osmolality: 305 mosm/kg — ABNORMAL HIGH (ref 275–295)

## 2024-01-28 MED ORDER — INSULIN ASPART 100 UNIT/ML IJ SOLN
0.0000 [IU] | Freq: Three times a day (TID) | INTRAMUSCULAR | Status: DC
  Administered 2024-01-28: 3 [IU] via SUBCUTANEOUS
  Administered 2024-01-28: 2 [IU] via SUBCUTANEOUS
  Filled 2024-01-28 (×2): qty 2

## 2024-01-28 MED ORDER — INSULIN GLARGINE-YFGN 100 UNIT/ML ~~LOC~~ SOLN
10.0000 [IU] | Freq: Every day | SUBCUTANEOUS | Status: DC
  Administered 2024-01-28: 10 [IU] via SUBCUTANEOUS
  Filled 2024-01-28 (×2): qty 0.1

## 2024-01-28 MED ORDER — GABAPENTIN 300 MG PO CAPS
300.0000 mg | ORAL_CAPSULE | Freq: Three times a day (TID) | ORAL | Status: DC
  Administered 2024-01-28 – 2024-02-02 (×16): 300 mg via ORAL
  Filled 2024-01-28 (×16): qty 1

## 2024-01-28 MED ORDER — AMLODIPINE BESYLATE 5 MG PO TABS
5.0000 mg | ORAL_TABLET | Freq: Every day | ORAL | Status: DC
  Administered 2024-01-28: 5 mg via ORAL
  Filled 2024-01-28: qty 1

## 2024-01-28 MED ORDER — INSULIN ASPART 100 UNIT/ML IJ SOLN
5.0000 [IU] | Freq: Three times a day (TID) | INTRAMUSCULAR | Status: DC
  Administered 2024-01-28 (×2): 5 [IU] via SUBCUTANEOUS
  Filled 2024-01-28: qty 5

## 2024-01-28 MED ORDER — INSULIN REGULAR(HUMAN) IN NACL 100-0.9 UT/100ML-% IV SOLN
INTRAVENOUS | Status: DC
  Administered 2024-01-28: 5 [IU]/h via INTRAVENOUS

## 2024-01-28 MED ORDER — INSULIN ASPART 100 UNIT/ML IJ SOLN: 0.0000 [IU] | Freq: Every day | INTRAMUSCULAR | Status: DC

## 2024-01-28 MED ORDER — FOLIC ACID 1 MG PO TABS
1.0000 mg | ORAL_TABLET | Freq: Every day | ORAL | Status: DC
  Administered 2024-01-28 – 2024-02-02 (×6): 1 mg via ORAL
  Filled 2024-01-28 (×6): qty 1

## 2024-01-28 MED ORDER — ATORVASTATIN CALCIUM 40 MG PO TABS
40.0000 mg | ORAL_TABLET | Freq: Every day | ORAL | Status: DC
  Administered 2024-01-28 – 2024-02-02 (×6): 40 mg via ORAL
  Filled 2024-01-28 (×6): qty 1

## 2024-01-28 MED ORDER — DULOXETINE HCL 60 MG PO CPEP
60.0000 mg | ORAL_CAPSULE | Freq: Every day | ORAL | Status: DC
  Administered 2024-01-28 – 2024-02-01 (×6): 60 mg via ORAL
  Filled 2024-01-28 (×5): qty 1
  Filled 2024-01-28: qty 2

## 2024-01-28 MED ORDER — DEXTROSE IN LACTATED RINGERS 5 % IV SOLN: INTRAVENOUS | Status: DC

## 2024-01-28 MED ORDER — DULOXETINE HCL 30 MG PO CPEP
30.0000 mg | ORAL_CAPSULE | Freq: Every morning | ORAL | Status: DC
  Administered 2024-01-28 – 2024-02-02 (×6): 30 mg via ORAL
  Filled 2024-01-28 (×6): qty 1

## 2024-01-28 MED ORDER — MORPHINE SULFATE (PF) 2 MG/ML IV SOLN
2.0000 mg | INTRAVENOUS | Status: DC | PRN
  Administered 2024-01-28 (×2): 2 mg via INTRAVENOUS
  Filled 2024-01-28 (×2): qty 1

## 2024-01-28 MED ORDER — TRAZODONE HCL 50 MG PO TABS
50.0000 mg | ORAL_TABLET | Freq: Every evening | ORAL | Status: DC | PRN
  Administered 2024-01-28 – 2024-01-31 (×4): 50 mg via ORAL
  Filled 2024-01-28 (×4): qty 1

## 2024-01-28 MED ORDER — LACTATED RINGERS IV BOLUS
20.0000 mL/kg | Freq: Once | INTRAVENOUS | Status: AC
  Administered 2024-01-28: 1500 mL via INTRAVENOUS

## 2024-01-28 MED ORDER — ENOXAPARIN SODIUM 40 MG/0.4ML IJ SOSY
40.0000 mg | PREFILLED_SYRINGE | Freq: Every day | INTRAMUSCULAR | Status: DC
  Administered 2024-01-28 – 2024-02-02 (×6): 40 mg via SUBCUTANEOUS
  Filled 2024-01-28 (×6): qty 0.4

## 2024-01-28 MED ORDER — POTASSIUM CHLORIDE 10 MEQ/100ML IV SOLN
10.0000 meq | INTRAVENOUS | Status: AC
  Administered 2024-01-28 (×2): 10 meq via INTRAVENOUS
  Filled 2024-01-28 (×2): qty 100

## 2024-01-28 MED ORDER — LACTATED RINGERS IV SOLN: INTRAVENOUS | Status: DC

## 2024-01-28 NOTE — ED Notes (Signed)
 CCMD called to add pt to services

## 2024-01-28 NOTE — Progress Notes (Addendum)
  Progress Note   Patient: David Wyatt FMW:[id number] DOB: May 14, 1960 DOA: 01/27/2024     1 DOS: the patient was seen and examined on 01/28/2024   Same-day admission note:  He said that he is currently homeless but he has a place to go tomorrow.  He said that he has been compliant with his insulin  but has been noncompliant with a carb controlled diet and has been eating cookies frequently.  He is asking for pancake and bacon.  He was started on insulin  drip for management of hyperglycemic hyperosmolar state and is currently off of insulin .  He is currently on long-acting insulin  as well as sliding insulin .  I have ordered lispro 5 units 3 times daily AC.  Patient is currently on a carb controlled diet. Diabetes coordinator has been consulted. He denies any active symptoms except slight numbness and tingling of the right arm.   Author: Deliliah Room, MD 01/28/2024 10:06 AM  For on call review [email or web address].

## 2024-01-28 NOTE — ED Notes (Signed)
 Patient has been continuously calling for pain medication. Secretary notified Primary RN that patient was out of bed tangled in all of the wires. RN entered room and asked patient to get back in bed because he is connected to a bunch of wires. Patient asked about his pain medication. RN informed patient that the Dr. Had been notified and that he would have to wait until it was ordered. Primary RN asked patient to get back in bed and educated regarding falls. Patient then lunged at this RN in a threatening manner. Patient was informed that he would not come at this RN in a threatening manner. RN exited room.

## 2024-01-28 NOTE — H&P (Signed)
 History and Physical    Patient: David Wyatt FMW:[id number] DOB: 1961-02-06 DOA: 01/27/2024 DOS: the patient was seen and examined on 01/28/2024 PCP: Delbert Clam, MD  Patient coming from: Homeless  Chief Complaint:  Chief Complaint  Patient presents with   Fatigue   HPI: David Wyatt is a 63 y.o. male with medical history significant of insulin -dependent diabetes type 2, polysubstance abuse, medication noncompliance, homelessness, who has had recurrent hospitalizations and presented to the ER the most recent being 4 days ago with hyperglycemia and difficulty controlling his blood sugar is here again with blood sugar of more than 700.  Patient was discharged home with relevant prescriptions.  Could not tell this time around why he did not use his insulin .  He previously could not use it because he lost his keys.  Patient was seen here in the ER not in DKA.  pH is 7.3.  Patient also is generally weak.  He is withdrawn and not answering questions adequately.  Beta-hydroxybutyrate acid was negative.  His anion gap is 15.  Appears to have hyperosmolar hyperglycemia.  Patient has been initiated on IV insulin  and IV fluid in the ER.  He is being admitted for further evaluation and treatment.  Review of Systems: As mentioned in the history of present illness. All other systems reviewed and are negative. Past Medical History:  Diagnosis Date   Angioedema of lips 07/28/2012   left upper (07/29/2012)   Arthritis    hands and back   Chronic back pain    Cocaine abuse (HCC)    Diabetic neuropathy (HCC)    High cholesterol    Hypertension    Neuropathy    Type II diabetes mellitus (HCC)    Past Surgical History:  Procedure Laterality Date   BACK SURGERY     CYSTOSCOPY W/ URETERAL STENT PLACEMENT Right 02/23/2021   Procedure: CYSTOSCOPY WITH RETROGRADE PYELOGRAM/URETERAL STENT PLACEMENT;  Surgeon: Selma Donnice SAUNDERS, MD;  Location: [facility];  Service: Urology;  Laterality: Right;    CYSTOSCOPY/URETEROSCOPY/HOLMIUM LASER/STENT PLACEMENT Right 12/05/2022   Procedure: CYSTOSCOPY, RIGHT URETEROSCOPY, AND RIGHT URETERAL STENT PLACEMENT;  Surgeon: Shane Steffan BROCKS, MD;  Location: [facility];  Service: Urology;  Laterality: Right;  90 MINUTES   HERNIA REPAIR Right 04/01/2012   I & D EXTREMITY Left 12/13/2012   Procedure: IRRIGATION AND DEBRIDEMENT LEFT THUMB;  Surgeon: Franky SAUNDERS Curia, MD;  Location: [facility] OR;  Service: Orthopedics;  Laterality: Left;   INCISION AND DRAINAGE OF WOUND     boil on back/notes 07/14/2008 (07/29/2012)   INGUINAL HERNIA REPAIR  04/01/2012   Procedure: HERNIA REPAIR INGUINAL ADULT;  Surgeon: Sherlean JINNY Laughter, MD;  Location[facility] OR;  Service: General;  Laterality: Right;   INGUINAL HERNIA REPAIR Left 09/06/2016   Procedure: OPEN REPAIR LEFT INGUINAL HERNIA;  Surgeon: Tanda Locus, MD;  Location: [facility] OR;  Service: General;  Laterality: Left;   INSERTION OF MESH Left 09/06/2016   Procedure: INSERTION OF MESH;  Surgeon: Tanda Locus, MD;  Location: [facility] OR;  Service: General;  Laterality: Left;   TONSILLECTOMY     Social History:  reports that he has quit smoking. His smoking use included cigarettes. He has a 7.5 pack-year smoking history. He quit smokeless tobacco use about 8 years ago. He reports that he does not currently use alcohol. He reports that he does not currently use drugs.  Allergies  Allergen Reactions   Lisinopril  Anaphylaxis, Swelling and Other (See Comments)    Lip Swelling and angioedema, too  Family History  Problem Relation Age of Onset   Diabetes Mother    Kidney disease Mother    Hyperlipidemia Mother    Hyperlipidemia Father    Diabetes Father     Prior to Admission medications   Medication Sig Start Date End Date Taking? Authorizing Provider  Accu-Chek Softclix Lancets lancets Use to check blood sugar three times daily as directed 01/24/24   Amin, Sumayya, MD  amLODipine  (NORVASC ) 5 MG tablet Take 1 tablet (5 mg total) by mouth  daily. 01/25/24   Caleen Qualia, MD  atorvastatin  (LIPITOR) 40 MG tablet Take 1 tablet (40 mg total) by mouth daily. 01/24/24   Amin, Sumayya, MD  Blood Glucose Monitoring Suppl (ACCU-CHEK GUIDE ME) w/Device KIT Use to test blood sugar 3 times daily. 11/01/23   Shrivastava, Aryendra, MD  DULoxetine  (CYMBALTA ) 30 MG capsule Take 1 capsule (30 mg total) by mouth in the morning. 01/25/24   Amin, Sumayya, MD  DULoxetine  (CYMBALTA ) 60 MG capsule Take 1 capsule (60 mg total) by mouth at bedtime. 01/24/24   Amin, Sumayya, MD  folic acid (FOLVITE) 1 MG tablet Take 1 tablet (1 mg total) by mouth daily. 01/25/24   Amin, Sumayya, MD  gabapentin  (NEURONTIN ) 300 MG capsule Take 1 capsule (300 mg total) by mouth 3 (three) times daily. 01/24/24   Amin, Sumayya, MD  glucose blood (ACCU-CHEK GUIDE TEST) test strip Use as instructed to check blood sugar three times daily 01/24/24   Amin, Sumayya, MD  insulin  aspart (NOVOLOG ) 100 UNIT/ML FlexPen Inject 10 Units into the skin 3 (three) times daily with meals. Pen expires 28 days after first use 01/24/24   Amin, Sumayya, MD  insulin  glargine (LANTUS ) 100 UNIT/ML Solostar Pen Inject 15 Units into the skin at bedtime. 01/24/24   Amin, Sumayya, MD  Insulin  Pen Needle 32G X 4 MM MISC Use 1 each to inject insulin  into the skin 4 (four) times daily -  before meals and at bedtime. 01/24/24   Amin, Sumayya, MD  metFORMIN  (GLUCOPHAGE ) 1000 MG tablet Take 1 tablet (1,000 mg total) by mouth 2 (two) times daily with a meal. 01/24/24   Caleen Qualia, MD  Multiple Vitamin (MULTIVITAMIN WITH MINERALS) TABS tablet Take 1 tablet by mouth daily. 01/25/24   Amin, Sumayya, MD  thiamine (VITAMIN B1) 100 MG tablet Take 1 tablet (100 mg total) by mouth daily. 01/25/24   Amin, Sumayya, MD  traZODone  (DESYREL ) 50 MG tablet Take 1 tablet (50 mg total) by mouth at bedtime as needed for sleep. 01/24/24   Caleen Qualia, MD    Physical Exam: Vitals:   01/27/24 2200 01/27/24 2215 01/27/24 2300  01/27/24 2344  BP: (!) 144/98 (!) 149/82 (!) 141/82 (!) 168/89  Pulse: 66 65 76 92  Resp: 13 13 (!) 24 (!) 24  Temp:    98.4 F (36.9 C)  TempSrc:    Oral  SpO2: 92% 96% 98% 100%  Weight:    75 kg  Height:    5' 8 (1.727 m)   Constitutional: Chronically ill looking NAD, calm, comfortable Eyes: PERRL, lids and conjunctivae normal ENMT: Mucous membranes are dry. Posterior pharynx clear of any exudate or lesions.Normal dentition.  Neck: normal, supple, no masses, no thyromegaly Respiratory: clear to auscultation bilaterally, no wheezing, no crackles. Normal respiratory effort. No accessory muscle use.  Cardiovascular: Sinus tachycardia, no murmurs / rubs / gallops. No extremity edema. 2+ pedal pulses. No carotid bruits.  Abdomen: no tenderness, no masses palpated. No hepatosplenomegaly. Bowel sounds  positive.  Musculoskeletal: Good range of motion, no joint swelling or tenderness, Skin: no rashes, lesions, ulcers. No induration Neurologic: CN 2-12 grossly intact. Sensation intact, DTR normal. Strength 5/5 in all 4.  Psychiatric: Normal judgment and insight. Alert and oriented x 3. Normal mood  Data Reviewed:  Afebrile with temperature 98.4, blood pressure 141/82, pulse 92 respiratory rate 24, venous pH of 7.391.  Sodium 133, potassium 5.3, glucose 752 CO2 21, chloride 97 creatinine is 1.38.  Alkaline phosphatase 127 with albumin 3.4.  Lactic acid 3.2.  CBC largely within normal.  Urinalysis showed glucosuria.  Anion gap of 15  Assessment and Plan:  #1 hyperosmolar hyperglycemia: Most likely due to noncompliance.  Patient has had recurrent visit with hypoglycemia.  Patient will be admitted to stepdown unit.  Initiate and continue the insulin  drip until hypoglycemia resolves.  Hydrate aggressively.  Will need to once again address social factors if possible.  Gradually titrate patient down to his Lantus  and sliding scale insulin .  #2 essential hypertension: Patient supposed to be on  amlodipine .  Resume blood pressure medications  #3 polysubstance abuse: Continue counseling  #4 homelessness: Child psychotherapist consult  #5 pseudohyponatremia: Will correct hyperglycemia  #6 hyperkalemia: Most likely will resolve with insulin  drip.  Continue to monitor  #7 dehydration: Has mild AKI.  Hydrate aggressively    Advance Care Planning:   Code Status: Full Code   Consults: None  Family Communication: No family at bedside  Severity of Illness: The appropriate patient status for this patient is INPATIENT. Inpatient status is judged to be reasonable and necessary in order to provide the required intensity of service to ensure the patient's safety. The patient's presenting symptoms, physical exam findings, and initial radiographic and laboratory data in the context of their chronic comorbidities is felt to place them at high risk for further clinical deterioration. Furthermore, it is not anticipated that the patient will be medically stable for discharge from the hospital within 2 midnights of admission.   * I certify that at the point of admission it is my clinical judgment that the patient will require inpatient hospital care spanning beyond 2 midnights from the point of admission due to high intensity of service, high risk for further deterioration and high frequency of surveillance required.*  AuthorBETHA SIM KNOLL, MD 01/28/2024 12:44 AM  For on call review [email or web address].

## 2024-01-28 NOTE — ED Notes (Signed)
 RN took patient to shower.

## 2024-01-28 NOTE — TOC CM/SW Note (Signed)
 TOC consult received as patient is currently homeless. Per notes, patient was given local housing listings, community resources to assist with housing, and substance use resources on 11/15. See SW note dated 11/15 for further information.   SW to follow up with patient for further assistance.  Merilee Batty, MSN, RN Case Management [phone]

## 2024-01-28 NOTE — Progress Notes (Signed)
-----------------------------------------------------------  [facility]--------------------------------------------------- --------------------------------------------------------D(Data) A(Action) R(response) Note------------------------------------------------  Patient Name: David Wyatt Patient DOB: 10/19/1960 Date: @TODAY @      Data: Reviewed labs, VS, notes.    Action: Requested downgrade.      Response:  MD downgrade to med tele.     Sharolyn Batman, RN [facility]

## 2024-01-29 ENCOUNTER — Telehealth: Payer: Self-pay

## 2024-01-29 ENCOUNTER — Other Ambulatory Visit (HOSPITAL_COMMUNITY): Payer: Self-pay

## 2024-01-29 DIAGNOSIS — E1100 Type 2 diabetes mellitus with hyperosmolarity without nonketotic hyperglycemic-hyperosmolar coma (NKHHC): Secondary | ICD-10-CM | POA: Diagnosis not present

## 2024-01-29 LAB — BASIC METABOLIC PANEL WITH GFR
Anion gap: 12 (ref 5–15)
BUN: 13 mg/dL (ref 8–23)
CO2: 21 mmol/L — ABNORMAL LOW (ref 22–32)
Calcium: 8.4 mg/dL — ABNORMAL LOW (ref 8.9–10.3)
Chloride: 100 mmol/L (ref 98–111)
Creatinine, Ser: 1.31 mg/dL — ABNORMAL HIGH (ref 0.61–1.24)
GFR, Estimated: >60 mL/min (ref >60)
Glucose, Bld: 444 mg/dL — ABNORMAL HIGH (ref 70–99)
Potassium: 4.2 mmol/L (ref 3.5–5.1)
Sodium: 133 mmol/L — ABNORMAL LOW (ref 135–145)

## 2024-01-29 LAB — GLUCOSE, CAPILLARY
Glucose-Capillary: 351 mg/dL — ABNORMAL HIGH (ref 70–99)
Glucose-Capillary: 219 mg/dL — ABNORMAL HIGH (ref 70–99)
Glucose-Capillary: 206 mg/dL — ABNORMAL HIGH (ref 70–99)
Glucose-Capillary: 312 mg/dL — ABNORMAL HIGH (ref 70–99)

## 2024-01-29 MED ORDER — ISOSORBIDE MONONITRATE ER 30 MG PO TB24
30.0000 mg | ORAL_TABLET | Freq: Every day | ORAL | Status: DC
  Administered 2024-01-29 – 2024-02-02 (×5): 30 mg via ORAL
  Filled 2024-01-29 (×5): qty 1

## 2024-01-29 MED ORDER — INSULIN ASPART 100 UNIT/ML IJ SOLN
0.0000 [IU] | Freq: Three times a day (TID) | INTRAMUSCULAR | Status: DC
  Administered 2024-01-29: 5 [IU] via SUBCUTANEOUS
  Administered 2024-01-29: 15 [IU] via SUBCUTANEOUS
  Administered 2024-01-29: 5 [IU] via SUBCUTANEOUS
  Administered 2024-01-30: 15 [IU] via SUBCUTANEOUS
  Administered 2024-01-30 – 2024-01-31 (×2): 5 [IU] via SUBCUTANEOUS
  Administered 2024-01-31: 8 [IU] via SUBCUTANEOUS
  Administered 2024-01-31: 15 [IU] via SUBCUTANEOUS
  Administered 2024-02-01: 13 [IU] via SUBCUTANEOUS
  Administered 2024-02-01: 3 [IU] via SUBCUTANEOUS
  Administered 2024-02-01: 11 [IU] via SUBCUTANEOUS
  Administered 2024-02-02: 8 [IU] via SUBCUTANEOUS
  Filled 2024-01-29: qty 15
  Filled 2024-01-29: qty 5
  Filled 2024-01-29: qty 10
  Filled 2024-01-29: qty 8
  Filled 2024-01-29: qty 5
  Filled 2024-01-29 (×2): qty 15
  Filled 2024-01-29 (×2): qty 5
  Filled 2024-01-29: qty 8

## 2024-01-29 MED ORDER — INSULIN ASPART 100 UNIT/ML IJ SOLN: 3.0000 [IU] | Freq: Three times a day (TID) | INTRAMUSCULAR | Status: DC

## 2024-01-29 MED ORDER — CARVEDILOL 3.125 MG PO TABS
3.1250 mg | ORAL_TABLET | Freq: Two times a day (BID) | ORAL | Status: DC
  Administered 2024-01-29 – 2024-02-02 (×9): 3.125 mg via ORAL
  Filled 2024-01-29 (×9): qty 1

## 2024-01-29 MED ORDER — AMLODIPINE BESYLATE 10 MG PO TABS
10.0000 mg | ORAL_TABLET | Freq: Every day | ORAL | Status: DC
  Administered 2024-01-29 – 2024-02-02 (×5): 10 mg via ORAL
  Filled 2024-01-29 (×5): qty 1

## 2024-01-29 MED ORDER — LACTATED RINGERS IV SOLN: INTRAVENOUS | Status: AC

## 2024-01-29 MED ORDER — INSULIN ASPART 100 UNIT/ML IJ SOLN
0.0000 [IU] | Freq: Every day | INTRAMUSCULAR | Status: DC
  Administered 2024-01-29: 4 [IU] via SUBCUTANEOUS
  Administered 2024-02-01: 3 [IU] via SUBCUTANEOUS
  Filled 2024-01-29: qty 4
  Filled 2024-01-29: qty 5

## 2024-01-29 MED ORDER — INSULIN GLARGINE-YFGN 100 UNIT/ML ~~LOC~~ SOLN
20.0000 [IU] | Freq: Every day | SUBCUTANEOUS | Status: DC
  Administered 2024-01-29: 20 [IU] via SUBCUTANEOUS
  Filled 2024-01-29 (×2): qty 0.2

## 2024-01-29 MED ORDER — INSULIN ASPART 100 UNIT/ML IJ SOLN
5.0000 [IU] | Freq: Three times a day (TID) | INTRAMUSCULAR | Status: DC
  Administered 2024-01-29 – 2024-02-02 (×12): 5 [IU] via SUBCUTANEOUS
  Filled 2024-01-29 (×10): qty 5

## 2024-01-29 NOTE — Telephone Encounter (Signed)
 Call received from the patient this morning.  He is currently hospitalized and told me that he is still homeless and he missed his appointment last Wed, 01/21/2024 with Tana Bamberger, [facility].  He was supposed to meet Tana at [facility] but he said he went to the wrong station and he wants to talk to him but doesn't have his number. I told him that I could give him the number but he said he doesn't have anything to write with and no one was with him.  I told him that I would contact the SW or CM working with him and give them David Wyatt's number and ask that they give it to him.  He was in agreement with that plan and said he would call Tana.   David Wyatt's number : [phone] shared with Andrez George, RN CM and Inocente Kindle, LCSW via Secure Chat requesting they share this number with the patient

## 2024-01-29 NOTE — Progress Notes (Signed)
 Patient has been complaining of persistent hungry and thirst throughout the night. Consumed several cups of diet coke, saltine and graham crackers. Educated on compliance to carb modified diet.

## 2024-01-29 NOTE — Plan of Care (Signed)
  Problem: Education: Goal: Ability to describe self-care measures that may prevent or decrease complications (Diabetes Survival Skills Education) will improve Outcome: Progressing   Problem: Coping: Goal: Ability to adjust to condition or change in health will improve Outcome: Progressing   Problem: Health Behavior/Discharge Planning: Goal: Ability to identify and utilize available resources and services will improve Outcome: Progressing   Problem: Metabolic: Goal: Ability to maintain appropriate glucose levels will improve Outcome: Progressing   Problem: Nutritional: Goal: Progress toward achieving an optimal weight will improve Outcome: Progressing

## 2024-01-29 NOTE — Inpatient Diabetes Management (Signed)
 Inpatient Diabetes Program Recommendations  AACE/ADA: New Consensus Statement on Inpatient Glycemic Control (2015)  Target Ranges:  Prepandial:   less than 140 mg/dL      Peak postprandial:   less than 180 mg/dL (1-2 hours)      Critically ill patients:  140 - 180 mg/dL    Latest Reference Range & Units 01/28/24 05:32 01/28/24 07:39 01/28/24 11:49 01/28/24 17:50 01/28/24 19:36 01/28/24 20:18 01/29/24 08:13  Glucose-Capillary 70 - 99 mg/dL 862 (H) 804 (H) 785 (H) 195 (H) 272 (H) 189 (H) 351 (H)    Latest Reference Range & Units 01/27/24 18:22 01/28/24 01:35 01/29/24 02:40  Beta-Hydroxybutyric Acid 0.05 - 0.27 mmol/L 0.15    Glucose 70 - 99 mg/dL 247 (HH) 848 (H) 555 (H)    Latest Reference Range & Units 06/23/22 17:25 09/17/22 15:57 12/02/22 11:40 02/20/23 11:32 05/23/23 07:16 09/27/23 13:41 12/06/23 21:39  Hemoglobin A1C 4.8 - 5.6 % 11.4 (H) 14.7 ! 12.4 (H) Pend Pend 8.3 (H) 13.9 (H) 12.4 (H)    Latest Reference Range & Units 01/23/24 08:01  COCAINE NONE DETECTED  POSITIVE !   Review of Glycemic Control  Diabetes history: DM2 Outpatient Diabetes medications: Lantus  15 units at bedtime, Novolog  10 units TID with meals, Metformin  1000 mg BID Current orders for Inpatient glycemic control: Semglee  20 units daily, Novolog  0-15 units TID with meals, Novolog  0-5 units at bedtime, Novolog  5 units TID with meals  Inpatient Diabetes Program Recommendations:    Insulin : Noted Semglee  increased, Novolog  correction scale increased and Novolog  meal coverage increased today.   NOTE: Patient admitted with hyperosmolar hyperglycemia with initial glucose 752 mg/dl. Patient is well known to inpatient DM team. Patient has had 39 ED visits in past 6 months and 8 hospital admission over the past 6 months.   Per note today by Dr. Dennise, ran out of his insulin  due to insurance and homelessness issues.  Requested outpatient [facility] pharmacy to check to see when insulin  was last filled and when it can be filled  again.  Per outpatient [facility] pharmacy check, Lantus  and Novolog  were both filled 01/24/24 and next fill date is 04/13/24(Lantus ) and 04/17/24(novolog ).   Not sure what can be done to help patient regarding insulin . Anticipate drug abuse and  homeless issues impact ability for patient to manage his DM.  Thanks, Earnie Gainer, RN, MSN, CDCES Diabetes Coordinator Inpatient Diabetes Program [phone] (Team Pager from 8am to 5pm)

## 2024-01-29 NOTE — Plan of Care (Signed)
  Problem: Education: Goal: Ability to describe self-care measures that may prevent or decrease complications (Diabetes Survival Skills Education) will improve Outcome: Progressing   Problem: Coping: Goal: Ability to adjust to condition or change in health will improve Outcome: Progressing   Problem: Health Behavior/Discharge Planning: Goal: Ability to identify and utilize available resources and services will improve Outcome: Progressing   Problem: Metabolic: Goal: Ability to maintain appropriate glucose levels will improve Outcome: Progressing   Problem: Education: Goal: Ability to describe self-care measures that may prevent or decrease complications (Diabetes Survival Skills Education) will improve Outcome: Progressing   Problem: Metabolic: Goal: Ability to maintain appropriate glucose levels will improve Outcome: Progressing   Problem: Nutritional: Goal: Maintenance of adequate nutrition will improve Outcome: Progressing

## 2024-01-29 NOTE — Plan of Care (Signed)
  Problem: Coping: Goal: Ability to adjust to condition or change in health will improve Outcome: Progressing   Problem: Health Behavior/Discharge Planning: Goal: Ability to identify and utilize available resources and services will improve Outcome: Progressing   Problem: Skin Integrity: Goal: Risk for impaired skin integrity will decrease Outcome: Progressing   Problem: Cardiac: Goal: Ability to maintain an adequate cardiac output will improve Outcome: Progressing   Problem: Metabolic: Goal: Ability to maintain appropriate glucose levels will improve Outcome: Not Progressing

## 2024-01-29 NOTE — Progress Notes (Signed)
 PROGRESS NOTE     Patient Demographics:    David Wyatt, is a 63 y.o. male, DOB - 1961-02-06, FMW:[id number]  Outpatient Primary MD for the patient is Newlin, Enobong, MD    LOS - 2  Admit date - 01/27/2024    Chief Complaint  Patient presents with   Fatigue       Brief Narrative (HPI from H&P)   63 y.o. male with medical history significant of insulin -dependent diabetes type 2, polysubstance abuse, medication noncompliance, homelessness, who has had recurrent hospitalizations and presented to the ER the most recent being 4 days ago with hyperglycemia and difficulty controlling his blood sugar is here again with blood sugar of more than 700.  Patient was discharged home with relevant prescriptions.  Could not tell this time around why he did not use his insulin .  He previously could not use it because he lost his keys.  Patient was seen here in the ER not in DKA.  pH is 7.3.  Patient also is generally weak.  He is withdrawn and not answering questions adequately.  Beta-hydroxybutyrate acid was negative.  His anion gap is 15.  Appears to have hyperosmolar hyperglycemia.  Patient has been initiated on IV insulin  and IV fluid in the ER.    Subjective:    David Wyatt today has, No headache, No chest pain, No abdominal pain - No Nausea, No new weakness tingling or numbness, no SOB   Assessment  & Plan :   #1 hyperosmolar hyperglycemia: In a patient with known history of DM type II who ran out of his insulin  due to insurance and homelessness issues, has been placed on subcu insulin , counseled, diabetic education, monitor CBG today and adjust further.  Likely discharge in 1 to 2 days  Lab Results  Component Value Date   HGBA1C  12.4 (H) 12/06/2023   CBG (last 3)  Recent Labs    01/28/24 1750 01/28/24 1936 01/28/24 2018  GLUCAP 195* 272* 189*    #2 essential hypertension: On Norvasc , blood pressure still high added low-dose Coreg  and Imdur .  Monitor and adjust.   #  3 polysubstance abuse: Has been extensively counseled   #4 homelessness: Child psychotherapist consult   #5 pseudohyponatremia: Due to #1 above resolved   #6  Dehydration with AKI  : IVF, and monitor.         Condition - Fair  Family Communication  :  None  Code Status :  Full  Consults  :  None  PUD Prophylaxis :    Procedures  :            Disposition Plan  :    Status is: Inpatient  DVT Prophylaxis  :    enoxaparin  (LOVENOX ) injection 40 mg Start: 01/28/24 1000    Lab Results  Component Value Date   PLT 304 01/28/2024    Diet :  Diet Order             Diet Carb Modified Fluid consistency: Thin; Room service appropriate? Yes  Diet effective now                    Inpatient Medications  Scheduled Meds:  amLODipine   10 mg Oral Daily   atorvastatin   40 mg Oral Daily   DULoxetine   30 mg Oral q AM   DULoxetine   60 mg Oral QHS   enoxaparin  (LOVENOX ) injection  40 mg Subcutaneous Daily   folic acid   1 mg Oral Daily   gabapentin   300 mg Oral TID   insulin  aspart  0-15 Units Subcutaneous TID WC   insulin  aspart  0-5 Units Subcutaneous QHS   insulin  aspart  5 Units Subcutaneous TID WC   insulin  glargine-yfgn  20 Units Subcutaneous Daily   Continuous Infusions:  lactated ringers      PRN Meds:.dextrose , morphine  injection, traZODone   Antibiotics  :    Anti-infectives (From admission, onward)    None         Objective:   Vitals:   01/28/24 1845 01/28/24 1900 01/28/24 2011 01/29/24 0318  BP: (!) 152/114 (!) 142/75 (!) 159/96 (!) 150/91  Pulse: 80 69 79 66  Resp: 17 15 18 16   Temp:   97.7 F (36.5 C) 98.4 F (36.9 C)  TempSrc:   Oral Oral  SpO2: 96% 98% 96% 95%  Weight:      Height:         Wt Readings from Last 3 Encounters:  01/27/24 75 kg  01/23/24 72 kg  01/22/24 68 kg     Intake/Output Summary (Last 24 hours) at 01/29/2024 0716 Last data filed at 01/29/2024 0400 Gross per 24 hour  Intake 960 ml  Output 700 ml  Net 260 ml     Physical Exam  Awake Alert, No new F.N deficits, Normal affect [place].AT,PERRAL Supple Neck, No JVD,   Symmetrical Chest wall movement, Good air movement bilaterally, CTAB RRR,No Gallops,Rubs or new Murmurs,  +ve B.Sounds, Abd Soft, No tenderness,   No Cyanosis, Clubbing or edema      Data Review:    Recent Labs  Lab 01/22/24 1946 01/22/24 1953 01/23/24 0754 01/23/24 1107 01/24/24 0237 01/27/24 1822 01/27/24 1834 01/28/24 0135  WBC 6.8  --  5.1  --  6.3 6.6  --  8.3  HGB 13.0   < > 12.9* 13.3 12.2* 13.1 13.6 12.9*  HCT 39.1   < > 39.9 39.0 36.6* 40.0 40.0 39.8  PLT 348  --  321  --  290 333  --  304  MCV 94.2  --  93.9  --  92.7 95.5  --  95.0  MCH 31.3  --  30.4  --  30.9 31.3  --  30.8  MCHC 33.2  --  32.3  --  33.3 32.8  --  32.4  RDW 12.8  --  12.9  --  12.8 13.2  --  13.1  LYMPHSABS  --   --   --   --   --  1.9  --   --   MONOABS  --   --   --   --   --  0.4  --   --   EOSABS  --   --   --   --   --  0.2  --   --   BASOSABS  --   --   --   --   --  0.1  --   --    < > = values in this interval not displayed.    Recent Labs  Lab 01/22/24 1041 01/22/24 1953 01/23/24 0754 01/23/24 0925 01/23/24 2123 01/24/24 0237 01/27/24 1822 01/27/24 1834 01/27/24 1845 01/27/24 2146 01/28/24 0135 01/29/24 0240  NA 134*   < > 134*   < > 137 135 133* 133*  --   --  142 133*  K 4.9   < > 4.6   < > 3.9 3.8 5.3* 5.0  --   --  3.9 4.2  CL 102   < > 99   < > 104 105 97*  --   --   --  108 100  CO2 22  --  24   < > 24 22 21*  --   --   --  22 21*  ANIONGAP 10  --  11   < > 9 8 15   --   --   --  12 12  GLUCOSE 711*   < > 662*   < > 223* 265* 752*  --   --   --  151* 444*  BUN 18   < > 14   < > 15 16 17   --   --   --  17 13   CREATININE 1.30*   < > 1.26*   < > 1.09 1.06 1.38*  --   --   --  1.02 1.31*  AST 78*  --  35  --   --   --  32  --   --   --   --   --   ALT 81*  --  58*  --   --   --  44  --   --   --   --   --   ALKPHOS 143*  --  103  --   --   --  127*  --   --   --   --   --   BILITOT 1.0  --  1.1  --   --   --  0.9  --   --   --   --   --   ALBUMIN 3.6  --  3.1*  --   --   --  3.4*  --   --   --   --   --   LATICACIDVEN  --   --   --   --   --   --   --   --  3.2* 1.9  --   --   MG  --   --   --   --   --  1.7  --   --   --   --   --   --   PHOS  --   --   --   --   --  2.9  --   --   --   --   --   --   CALCIUM  9.2  --  9.0   < > 8.6* 8.1* 9.3  --   --   --  9.3 8.4*   < > = values in this interval not displayed.      Recent Labs  Lab 01/23/24 2123 01/24/24 0237 01/27/24 1822 01/27/24 1845 01/27/24 2146 01/28/24 0135 01/29/24 0240  LATICACIDVEN  --   --   --  3.2* 1.9  --   --   MG  --  1.7  --   --   --   --   --   CALCIUM  8.6* 8.1* 9.3  --   --  9.3 8.4*    --------------------------------------------------------------------------------------------------------------- Lab Results  Component Value Date   CHOL 197 10/23/2023   HDL 62 10/23/2023   LDLCALC 112 (H) 10/23/2023   TRIG 113 10/23/2023   CHOLHDL 3.2 10/23/2023    Lab Results  Component Value Date   HGBA1C 12.4 (H) 12/06/2023   No results for input(s): TSH, T4TOTAL, FREET4, T3FREE, THYROIDAB in the last 72 hours. No results for input(s): VITAMINB12, FOLATE, FERRITIN, TIBC, IRON, RETICCTPCT in the last 72 hours. ------------------------------------------------------------------------------------------------------------------ Cardiac Enzymes No results for input(s): CKMB, TROPONINI, MYOGLOBIN in the last 168 hours.  Invalid input(s): CK  Micro Results No results found for this or any previous visit (from the past 240 hours).  Radiology Report No results found.   Signature  -    Lavada Stank M.D on 01/29/2024 at 7:16 AM   -  To page go to [email or web address]

## 2024-01-29 NOTE — TOC Initial Note (Signed)
 Transition of Care [facility]) - Initial/Assessment Note    Patient Details  Name: David Wyatt MRN: [id number] Date of Birth: Dec 31, 1960  [facility] CM/SW Contact:    Bernardino Dean, LCSWA Phone Number: 01/29/2024, 12:13 PM  Clinical Narrative:                 CSW received consult d/t patient being unhoused. Met with patient at bedside, provided phone number for [facility] counselor Renard Kennan: [phone]). Patient missed appointment with this person last week regarding housing counseling. Reviewed housing resources, including coordinated entry and local shelters. Encouraged patient to call while in hospital to make use of room phone.   Expected Discharge Plan: Homeless Shelter Barriers to Discharge: Financial Resources, Homeless with medical needs   Patient Goals and CMS Choice Patient states their goals for this hospitalization and ongoing recovery are:: Discharge with increased housing supports   Choice offered to / list presented to : NA      Expected Discharge Plan and Services In-house Referral: Clinical Social Work   Post Acute Care Choice: NA Living arrangements for the past 2 months: Homeless                   DME Agency: NA       HH Arranged: NA HH Agency: NA        Prior Living Arrangements/Services Living arrangements for the past 2 months: Homeless Lives with:: Self Patient language and need for interpreter reviewed:: Yes Do you feel safe going back to the place where you live?: Yes      Need for Family Participation in Patient Care: No (Comment) Care giver support system in place?: No (comment) Current home services: DME (Has rollator) Criminal Activity/Legal Involvement Pertinent to Current Situation/Hospitalization: No - Comment as needed  Activities of Daily Living   ADL Screening (condition at time of admission) Independently performs ADLs?: Yes (appropriate for developmental age) Is the patient deaf or have difficulty  hearing?: No Does the patient have difficulty seeing, even when wearing glasses/contacts?: No Does the patient have difficulty concentrating, remembering, or making decisions?: No  Permission Sought/Granted                  Emotional Assessment Appearance:: Appears stated age Attitude/Demeanor/Rapport: Engaged Affect (typically observed): Appropriate Orientation: : Oriented to Self, Oriented to Place, Oriented to  Time, Oriented to Situation   Psych Involvement: No (comment)  Admission diagnosis:  Hyperglycemia [R73.9] Fatigue, unspecified type [R53.83] Hyperglycemia due to diabetes mellitus (HCC) [E11.65] Patient Active Problem List   Diagnosis Date Noted   Paranoia (HCC) 01/24/2024   Hyperosmolar hyperglycemic state (HHS) (HCC) 01/23/2024   Auditory hallucination 01/23/2024   Depression with suicidal ideation 01/23/2024   Housing insecurity 01/23/2024   Polysubstance abuse (HCC) 01/23/2024   Depressive disorder 12/05/2023   Suicidal ideation 12/02/2023   Deficient knowledge of community resources 12/02/2023   Nausea vomiting and diarrhea 11/27/2023   Abdominal pain 11/27/2023   Chronic health problem 10/22/2023   AKI (acute kidney injury) 10/18/2023   Hyperglycemia 10/18/2023   Hyperglycemia due to diabetes mellitus (HCC) 09/27/2023   Malingering 07/15/2023   MDD (major depressive disorder), recurrent episode, severe (HCC) 05/19/2023   MDD (major depressive disorder), recurrent episode, mild 07/16/2022   MDD (major depressive disorder), recurrent severe, 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   Substance induced mood disorder (HCC) 06/18/2022   Homelessness 06/18/2022   Inadequate community resources 06/18/2022   Inability to access community resources due to  transportation insecurity 06/18/2022   Cocaine use disorder (HCC) 03/03/2022   Cocaine use disorder, severe, dependence (HCC) 01/21/2022   Suicidal behavior 01/20/2022   Acute respiratory failure with hypoxia  (HCC) 10/13/2021   History of cocaine use 07/23/2021   Chronic right shoulder pain 07/23/2021   Renal stones 07/23/2021   Dermoid cyst of skin of back 07/23/2021   Uncontrolled type 2 diabetes mellitus with hyperglycemia (HCC) 03/22/2021   Left kidney mass 03/22/2021   Hydronephrosis of right kidney 02/23/2021   Type 2 diabetes mellitus with hyperglycemia (HCC) 02/23/2021   Tobacco abuse 08/19/2018   Hyperlipidemia 04/24/2017   GERD (gastroesophageal reflux disease) 06/13/2015   Diabetic neuropathy (HCC) 05/01/2015   Essential hypertension, benign 11/01/2013   Lumbar disc herniation 09/13/2013   Constipation 09/13/2013   Chronic back pain 09/13/2013   Felon of finger of left hand with lymphangitis 12/13/2012   Crack cocaine use 07/29/2012   Arthritis 07/29/2012   Weakness generalized 07/29/2012   PCP:  Delbert Clam, MD Pharmacy:   [facility] [address] [place] [address] Phone: [phone] Fax: [phone]  [facility] [address] [place] [address] Phone: [phone] Fax: [phone]  [id number] [place], [place] - [address] AT [facility] [address] [place] [address] Phone: [phone] Fax: [phone]     Social Drivers of Health (SDOH) Social History: SDOH Screenings   Food Insecurity: Food Insecurity Present (01/28/2024)  Housing: High Risk (01/28/2024)  Transportation Needs: Unmet Transportation Needs (01/28/2024)  Utilities: Not At Risk (01/28/2024)  Recent Concern: Utilities - At Risk (12/07/2023)  Alcohol Screen: Low Risk  (12/07/2023)  Depression (PHQ2-9): Medium Risk (12/06/2023)  Financial Resource Strain: High Risk (01/15/2024)  Social Connections: Unknown (10/24/2023)  Tobacco Use: Medium Risk (01/27/2024)   SDOH Interventions:     Readmission Risk Interventions     No data to display

## 2024-01-30 DIAGNOSIS — E1100 Type 2 diabetes mellitus with hyperosmolarity without nonketotic hyperglycemic-hyperosmolar coma (NKHHC): Secondary | ICD-10-CM | POA: Diagnosis not present

## 2024-01-30 LAB — GLUCOSE, CAPILLARY
Glucose-Capillary: 231 mg/dL — ABNORMAL HIGH (ref 70–99)
Glucose-Capillary: 266 mg/dL — ABNORMAL HIGH (ref 70–99)
Glucose-Capillary: 354 mg/dL — ABNORMAL HIGH (ref 70–99)
Glucose-Capillary: 188 mg/dL — ABNORMAL HIGH (ref 70–99)

## 2024-01-30 LAB — BASIC METABOLIC PANEL WITH GFR
Anion gap: 12 (ref 5–15)
BUN: 15 mg/dL (ref 8–23)
CO2: 20 mmol/L — ABNORMAL LOW (ref 22–32)
Calcium: 8.5 mg/dL — ABNORMAL LOW (ref 8.9–10.3)
Chloride: 102 mmol/L (ref 98–111)
Creatinine, Ser: 1.08 mg/dL (ref 0.61–1.24)
GFR, Estimated: >60 mL/min (ref >60)
Glucose, Bld: 300 mg/dL — ABNORMAL HIGH (ref 70–99)
Potassium: 3.9 mmol/L (ref 3.5–5.1)
Sodium: 134 mmol/L — ABNORMAL LOW (ref 135–145)

## 2024-01-30 MED ORDER — INSULIN GLARGINE-YFGN 100 UNIT/ML ~~LOC~~ SOLN
30.0000 [IU] | Freq: Every day | SUBCUTANEOUS | Status: DC
  Administered 2024-01-30: 30 [IU] via SUBCUTANEOUS
  Filled 2024-01-30 (×2): qty 0.3

## 2024-01-30 MED ORDER — LACTATED RINGERS IV BOLUS
1000.0000 mL | Freq: Once | INTRAVENOUS | Status: AC
  Administered 2024-01-30: 1000 mL via INTRAVENOUS

## 2024-01-30 NOTE — Progress Notes (Signed)
 PROGRESS NOTE     Patient Demographics:    David Wyatt, is a 63 y.o. male, DOB - 10/14/1960, FMW:[id number]  Outpatient Primary MD for the patient is Newlin, Enobong, MD    LOS - 3  Admit date - 01/27/2024    Chief Complaint  Patient presents with   Fatigue       Brief Narrative (HPI from H&P)   63 y.o. male with medical history significant of insulin -dependent diabetes type 2, polysubstance abuse, medication noncompliance, homelessness, who has had recurrent hospitalizations and presented to the ER the most recent being 4 days ago with hyperglycemia and difficulty controlling his blood sugar is here again with blood sugar of more than 700.  Patient was discharged home with relevant prescriptions.  Could not tell this time around why he did not use his insulin .  He previously could not use it because he lost his keys.  Patient was seen here in the ER not in DKA.  pH is 7.3.  Patient also is generally weak.  He is withdrawn and not answering questions adequately.  Beta-hydroxybutyrate acid was negative.  His anion gap is 15.  Appears to have hyperosmolar hyperglycemia.  Patient has been initiated on IV insulin  and IV fluid in the ER.    Subjective:   Patient in bed, appears comfortable, denies any headache, no fever, no chest pain or pressure, no shortness of breath , no abdominal pain. No new focal weakness.    Assessment  & Plan :   #1 hyperosmolar hyperglycemia: In a patient with known history of DM type II who ran out of his insulin  due to insurance and homelessness issues, has been placed on subcu insulin -dose adjusted further on 01/30/2024, counseled, diabetic education, monitor CBG today and adjust further.  Likely  discharge in 1 to 2 days  Lab Results  Component Value Date   HGBA1C 12.4 (H) 12/06/2023   CBG (last 3)  Recent Labs    01/29/24 1252 01/29/24 1640 01/29/24 2030  GLUCAP 219* 206* 312*    #2.  Poorly controlled essential hypertension: On Norvasc , have added Coreg   and Imdur .  Much improved.   #3 polysubstance abuse: Has been extensively counseled   #4 homelessness: Child psychotherapist consult   #5 pseudohyponatremia: Due to #1 above resolved   #6  Dehydration with AKI  : IVF, and monitor.         Condition - Fair  Family Communication  :  None  Code Status :  Full  Consults  :  None  PUD Prophylaxis :    Procedures  :            Disposition Plan  :    Status is: Inpatient  DVT Prophylaxis  :    enoxaparin  (LOVENOX ) injection 40 mg Start: 01/28/24 1000    Lab Results  Component Value Date   PLT 304 01/28/2024    Diet :  Diet Order             Diet Carb Modified Fluid consistency: Thin; Room service appropriate? Yes  Diet effective now                    Inpatient Medications  Scheduled Meds:  amLODipine   10 mg Oral Daily   atorvastatin   40 mg Oral Daily   carvedilol   3.125 mg Oral BID WC   DULoxetine   30 mg Oral q AM   DULoxetine   60 mg Oral QHS   enoxaparin  (LOVENOX ) injection  40 mg Subcutaneous Daily   folic acid   1 mg Oral Daily   gabapentin   300 mg Oral TID   insulin  aspart  0-15 Units Subcutaneous TID WC   insulin  aspart  0-5 Units Subcutaneous QHS   insulin  aspart  5 Units Subcutaneous TID WC   insulin  glargine-yfgn  30 Units Subcutaneous Daily   isosorbide  mononitrate  30 mg Oral Daily   Continuous Infusions:   PRN Meds:.dextrose , morphine  injection, traZODone   Antibiotics  :    Anti-infectives (From admission, onward)    None         Objective:   Vitals:   01/29/24 1600 01/29/24 2026 01/30/24 0000 01/30/24 0400  BP: 137/83 (!) 138/94 115/66 132/80  Pulse: 89 99 71 75  Resp: (!) 21 20 19 18   Temp:  97.9 F  (36.6 C) 97.9 F (36.6 C) 97.8 F (36.6 C)  TempSrc:  Axillary Axillary Axillary  SpO2: 93% 94% 95% 98%  Weight:      Height:        Wt Readings from Last 3 Encounters:  01/27/24 75 kg  01/23/24 72 kg  01/22/24 68 kg     Intake/Output Summary (Last 24 hours) at 01/30/2024 0749 Last data filed at 01/30/2024 0344 Gross per 24 hour  Intake 960 ml  Output 1325 ml  Net -365 ml     Physical Exam  Awake Alert, No new F.N deficits, Normal affect [place].AT,PERRAL Supple Neck, No JVD,   Symmetrical Chest wall movement, Good air movement bilaterally, CTAB RRR,No Gallops,Rubs or new Murmurs,  +ve B.Sounds, Abd Soft, No tenderness,   No Cyanosis, Clubbing or edema      Data Review:    Recent Labs  Lab 01/23/24 0754 01/23/24 1107 01/24/24 0237 01/27/24 1822 01/27/24 1834 01/28/24 0135  WBC 5.1  --  6.3 6.6  --  8.3  HGB 12.9* 13.3 12.2* 13.1 13.6 12.9*  HCT 39.9 39.0 36.6* 40.0 40.0 39.8  PLT 321  --  290 333  --  304  MCV 93.9  --  92.7 95.5  --  95.0  MCH 30.4  --  30.9 31.3  --  30.8  MCHC 32.3  --  33.3 32.8  --  32.4  RDW 12.9  --  12.8 13.2  --  13.1  LYMPHSABS  --   --   --  1.9  --   --   MONOABS  --   --   --  0.4  --   --   EOSABS  --   --   --  0.2  --   --   BASOSABS  --   --   --  0.1  --   --     Recent Labs  Lab 01/23/24 0754 01/23/24 0925 01/24/24 0237 01/27/24 1822 01/27/24 1834 01/27/24 1845 01/27/24 2146 01/28/24 0135 01/29/24 0240 01/30/24 0336  NA 134*   < > 135 133* 133*  --   --  142 133* 134*  K 4.6   < > 3.8 5.3* 5.0  --   --  3.9 4.2 3.9  CL 99   < > 105 97*  --   --   --  108 100 102  CO2 24   < > 22 21*  --   --   --  22 21* 20*  ANIONGAP 11   < > 8 15  --   --   --  12 12 12   GLUCOSE 662*   < > 265* 752*  --   --   --  151* 444* 300*  BUN 14   < > 16 17  --   --   --  17 13 15   CREATININE 1.26*   < > 1.06 1.38*  --   --   --  1.02 1.31* 1.08  AST 35  --   --  32  --   --   --   --   --   --   ALT 58*  --   --  44  --   --    --   --   --   --   ALKPHOS 103  --   --  127*  --   --   --   --   --   --   BILITOT 1.1  --   --  0.9  --   --   --   --   --   --   ALBUMIN 3.1*  --   --  3.4*  --   --   --   --   --   --   LATICACIDVEN  --   --   --   --   --  3.2* 1.9  --   --   --   MG  --   --  1.7  --   --   --   --   --   --   --   PHOS  --   --  2.9  --   --   --   --   --   --   --   CALCIUM  9.0   < > 8.1* 9.3  --   --   --  9.3 8.4* 8.5*   < > = values in this interval not displayed.      Recent Labs  Lab 01/24/24 0237 01/27/24 1822 01/27/24 1845 01/27/24 2146 01/28/24 0135 01/29/24 0240 01/30/24 0336  LATICACIDVEN  --   --  3.2* 1.9  --   --   --   MG 1.7  --   --   --   --   --   --  CALCIUM  8.1* 9.3  --   --  9.3 8.4* 8.5*    --------------------------------------------------------------------------------------------------------------- Lab Results  Component Value Date   CHOL 197 10/23/2023   HDL 62 10/23/2023   LDLCALC 112 (H) 10/23/2023   TRIG 113 10/23/2023   CHOLHDL 3.2 10/23/2023    Lab Results  Component Value Date   HGBA1C 12.4 (H) 12/06/2023   No results for input(s): TSH, T4TOTAL, FREET4, T3FREE, THYROIDAB in the last 72 hours. No results for input(s): VITAMINB12, FOLATE, FERRITIN, TIBC, IRON, RETICCTPCT in the last 72 hours. ------------------------------------------------------------------------------------------------------------------ Cardiac Enzymes No results for input(s): CKMB, TROPONINI, MYOGLOBIN in the last 168 hours.  Invalid input(s): CK  Micro Results No results found for this or any previous visit (from the past 240 hours).  Radiology Report No results found.   Signature  -   Lavada Stank M.D on 01/30/2024 at 7:49 AM   -  To page go to [email or web address]

## 2024-01-31 DIAGNOSIS — E1100 Type 2 diabetes mellitus with hyperosmolarity without nonketotic hyperglycemic-hyperosmolar coma (NKHHC): Secondary | ICD-10-CM | POA: Diagnosis not present

## 2024-01-31 LAB — GLUCOSE, CAPILLARY
Glucose-Capillary: 463 mg/dL — ABNORMAL HIGH (ref 70–99)
Glucose-Capillary: 339 mg/dL — ABNORMAL HIGH (ref 70–99)
Glucose-Capillary: 267 mg/dL — ABNORMAL HIGH (ref 70–99)
Glucose-Capillary: 203 mg/dL — ABNORMAL HIGH (ref 70–99)
Glucose-Capillary: 136 mg/dL — ABNORMAL HIGH (ref 70–99)
Glucose-Capillary: 222 mg/dL — ABNORMAL HIGH (ref 70–99)

## 2024-01-31 LAB — BASIC METABOLIC PANEL WITH GFR
Anion gap: 13 (ref 5–15)
BUN: 14 mg/dL (ref 8–23)
CO2: 21 mmol/L — ABNORMAL LOW (ref 22–32)
Calcium: 8.7 mg/dL — ABNORMAL LOW (ref 8.9–10.3)
Chloride: 99 mmol/L (ref 98–111)
Creatinine, Ser: 1.3 mg/dL — ABNORMAL HIGH (ref 0.61–1.24)
GFR, Estimated: >60 mL/min (ref >60)
Glucose, Bld: 500 mg/dL — ABNORMAL HIGH (ref 70–99)
Potassium: 4.1 mmol/L (ref 3.5–5.1)
Sodium: 133 mmol/L — ABNORMAL LOW (ref 135–145)

## 2024-01-31 MED ORDER — INSULIN GLARGINE-YFGN 100 UNIT/ML ~~LOC~~ SOLN
35.0000 [IU] | Freq: Every day | SUBCUTANEOUS | Status: DC
  Administered 2024-01-31: 35 [IU] via SUBCUTANEOUS
  Filled 2024-01-31 (×2): qty 0.35

## 2024-01-31 MED ORDER — TRAMADOL HCL 50 MG PO TABS
50.0000 mg | ORAL_TABLET | Freq: Two times a day (BID) | ORAL | Status: DC | PRN
  Administered 2024-01-31 – 2024-02-01 (×2): 50 mg via ORAL
  Filled 2024-01-31 (×2): qty 1

## 2024-01-31 NOTE — Plan of Care (Signed)
   Problem: Coping: Goal: Ability to adjust to condition or change in health will improve Outcome: Progressing   Problem: Health Behavior/Discharge Planning: Goal: Ability to manage health-related needs will improve Outcome: Progressing   Problem: Metabolic: Goal: Ability to maintain appropriate glucose levels will improve Outcome: Progressing   Problem: Nutritional: Goal: Maintenance of adequate nutrition will improve Outcome: Progressing

## 2024-01-31 NOTE — Progress Notes (Signed)
 PROGRESS NOTE     Patient Demographics:    David Wyatt, is a 63 y.o. male, DOB - 02/19/1961, FMW:[id number]  Outpatient Primary MD for the patient is Newlin, Enobong, MD    LOS - 4  Admit date - 01/27/2024    Chief Complaint  Patient presents with   Fatigue       Brief Narrative (HPI from H&P)   63 y.o. male with medical history significant of insulin -dependent diabetes type 2, polysubstance abuse, medication noncompliance, homelessness, who has had recurrent hospitalizations and presented to the ER the most recent being 4 days ago with hyperglycemia and difficulty controlling his blood sugar is here again with blood sugar of more than 700.  Patient was discharged home with relevant prescriptions.  Could not tell this time around why he did not use his insulin .  He previously could not use it because he lost his keys.  Patient was seen here in the ER not in DKA.  pH is 7.3.  Patient also is generally weak.  He is withdrawn and not answering questions adequately.  Beta-hydroxybutyrate acid was negative.  His anion gap is 15.  Appears to have hyperosmolar hyperglycemia.  Patient has been initiated on IV insulin  and IV fluid in the ER.    Subjective:   Patient in bed, appears comfortable, denies any headache, no fever, no chest pain or pressure, no shortness of breath , no abdominal pain. No new focal weakness.    Assessment  & Plan :   #1 hyperosmolar hyperglycemia: In a patient with known history of DM type II who ran out of his insulin  due to insurance and homelessness issues, has been placed on subcu insulin -dose adjusted further on 01/31/2024, counseled, diabetic education, he is eating a lot of cookies and soda drinks  throughout the night, counseled on dietary compliance.  Lab Results  Component Value Date   HGBA1C 12.4 (H) 12/06/2023   CBG (last 3)  Recent Labs    01/30/24 2055 01/31/24 0600 01/31/24 0715  GLUCAP 188* 463* 339*    #2.  Poorly controlled essential hypertension: On Norvasc ,  have added Coreg  and Imdur .  Much improved.   #3 polysubstance abuse: Has been extensively counseled   #4 homelessness: Child psychotherapist consult   #5 pseudohyponatremia: Due to #1 above resolved   #6  Dehydration with AKI on CKD 3 A: IVF, and monitor. Creatinine close to 1.3.  Now close to baseline        Condition - Fair  Family Communication  :  None  Code Status :  Full  Consults  :  None  PUD Prophylaxis :    Procedures  :            Disposition Plan  :    Status is: Inpatient  DVT Prophylaxis  :    enoxaparin  (LOVENOX ) injection 40 mg Start: 01/28/24 1000    Lab Results  Component Value Date   PLT 304 01/28/2024    Diet :  Diet Order             Diet Carb Modified Fluid consistency: Thin; Room service appropriate? Yes  Diet effective now                    Inpatient Medications  Scheduled Meds:  amLODipine   10 mg Oral Daily   atorvastatin   40 mg Oral Daily   carvedilol   3.125 mg Oral BID WC   DULoxetine   30 mg Oral q AM   DULoxetine   60 mg Oral QHS   enoxaparin  (LOVENOX ) injection  40 mg Subcutaneous Daily   folic acid   1 mg Oral Daily   gabapentin   300 mg Oral TID   insulin  aspart  0-15 Units Subcutaneous TID WC   insulin  aspart  0-5 Units Subcutaneous QHS   insulin  aspart  5 Units Subcutaneous TID WC   insulin  glargine-yfgn  35 Units Subcutaneous Daily   isosorbide  mononitrate  30 mg Oral Daily   Continuous Infusions:   PRN Meds:.dextrose , traMADol , traZODone   Antibiotics  :    Anti-infectives (From admission, onward)    None         Objective:   Vitals:   01/30/24 1640 01/30/24 2027 01/30/24 2337 01/31/24 0308  BP: (!) 140/97 (!)  140/70 132/82 137/85  Pulse: 84 80 82 72  Resp:  20 16 18   Temp:  98.8 F (37.1 C) 98.4 F (36.9 C) 98.2 F (36.8 C)  TempSrc:  Oral Oral Oral  SpO2:  94% 90% 95%  Weight:      Height:        Wt Readings from Last 3 Encounters:  01/27/24 75 kg  01/23/24 72 kg  01/22/24 68 kg     Intake/Output Summary (Last 24 hours) at 01/31/2024 0828 Last data filed at 01/31/2024 0618 Gross per 24 hour  Intake 628.07 ml  Output 1500 ml  Net -871.93 ml     Physical Exam  Awake Alert, No new F.N deficits, Normal affect [place].AT,PERRAL Supple Neck, No JVD,   Symmetrical Chest wall movement, Good air movement bilaterally, CTAB RRR,No Gallops,Rubs or new Murmurs,  +ve B.Sounds, Abd Soft, No tenderness,   No Cyanosis, Clubbing or edema      Data Review:    Recent Labs  Lab 01/27/24 1822 01/27/24 1834 01/28/24 0135  WBC 6.6  --  8.3  HGB 13.1 13.6 12.9*  HCT 40.0 40.0 39.8  PLT 333  --  304  MCV 95.5  --  95.0  MCH 31.3  --  30.8  MCHC 32.8  --  32.4  RDW 13.2  --  13.1  LYMPHSABS 1.9  --   --   MONOABS 0.4  --   --   EOSABS 0.2  --   --   BASOSABS 0.1  --   --     Recent Labs  Lab 01/27/24 1822 01/27/24 1834 01/27/24 1845 01/27/24 2146 01/28/24 0135 01/29/24 0240 01/30/24 0336 01/31/24 0339  NA 133* 133*  --   --  142 133* 134* 133*  K 5.3* 5.0  --   --  3.9 4.2 3.9 4.1  CL 97*  --   --   --  108 100 102 99  CO2 21*  --   --   --  22 21* 20* 21*  ANIONGAP 15  --   --   --  12 12 12 13   GLUCOSE 752*  --   --   --  151* 444* 300* 500*  BUN 17  --   --   --  17 13 15 14   CREATININE 1.38*  --   --   --  1.02 1.31* 1.08 1.30*  AST 32  --   --   --   --   --   --   --   ALT 44  --   --   --   --   --   --   --   ALKPHOS 127*  --   --   --   --   --   --   --   BILITOT 0.9  --   --   --   --   --   --   --   ALBUMIN 3.4*  --   --   --   --   --   --   --   LATICACIDVEN  --   --  3.2* 1.9  --   --   --   --   CALCIUM  9.3  --   --   --  9.3 8.4* 8.5* 8.7*       Recent Labs  Lab 01/27/24 1822 01/27/24 1845 01/27/24 2146 01/28/24 0135 01/29/24 0240 01/30/24 0336 01/31/24 0339  LATICACIDVEN  --  3.2* 1.9  --   --   --   --   CALCIUM  9.3  --   --  9.3 8.4* 8.5* 8.7*    --------------------------------------------------------------------------------------------------------------- Lab Results  Component Value Date   CHOL 197 10/23/2023   HDL 62 10/23/2023   LDLCALC 112 (H) 10/23/2023   TRIG 113 10/23/2023   CHOLHDL 3.2 10/23/2023    Lab Results  Component Value Date   HGBA1C 12.4 (H) 12/06/2023   No results for input(s): TSH, T4TOTAL, FREET4, T3FREE, THYROIDAB in the last 72 hours. No results for input(s): VITAMINB12, FOLATE, FERRITIN, TIBC, IRON, RETICCTPCT in the last 72 hours. ------------------------------------------------------------------------------------------------------------------ Cardiac Enzymes No results for input(s): CKMB, TROPONINI, MYOGLOBIN in the last 168 hours.  Invalid input(s): CK  Micro Results No results found for this or any previous visit (from the past 240 hours).  Radiology Report No results found.   Signature  -   Lavada Stank M.D on 01/31/2024 at 8:28 AM   -  To page go to [email or web address]

## 2024-01-31 NOTE — Plan of Care (Signed)
  Problem: Education: Goal: Ability to describe self-care measures that may prevent or decrease complications (Diabetes Survival Skills Education) will improve Outcome: Progressing   Problem: Coping: Goal: Ability to adjust to condition or change in health will improve Outcome: Progressing   Problem: Health Behavior/Discharge Planning: Goal: Ability to identify and utilize available resources and services will improve Outcome: Progressing Goal: Ability to manage health-related needs will improve Outcome: Progressing   Problem: Metabolic: Goal: Ability to maintain appropriate glucose levels will improve Outcome: Progressing   Problem: Nutritional: Goal: Maintenance of adequate nutrition will improve Outcome: Progressing

## 2024-02-01 DIAGNOSIS — E1100 Type 2 diabetes mellitus with hyperosmolarity without nonketotic hyperglycemic-hyperosmolar coma (NKHHC): Secondary | ICD-10-CM | POA: Diagnosis not present

## 2024-02-01 LAB — GLUCOSE, CAPILLARY
Glucose-Capillary: 320 mg/dL — ABNORMAL HIGH (ref 70–99)
Glucose-Capillary: 296 mg/dL — ABNORMAL HIGH (ref 70–99)
Glucose-Capillary: 172 mg/dL — ABNORMAL HIGH (ref 70–99)
Glucose-Capillary: 253 mg/dL — ABNORMAL HIGH (ref 70–99)

## 2024-02-01 MED ORDER — ACETAMINOPHEN 325 MG PO TABS
650.0000 mg | ORAL_TABLET | Freq: Once | ORAL | Status: AC
Start: 2024-02-02 — End: 2024-02-01
  Administered 2024-02-01: 650 mg via ORAL
  Filled 2024-02-01: qty 2

## 2024-02-01 MED ORDER — INSULIN GLARGINE-YFGN 100 UNIT/ML ~~LOC~~ SOLN
40.0000 [IU] | Freq: Every day | SUBCUTANEOUS | Status: DC
  Administered 2024-02-01: 40 [IU] via SUBCUTANEOUS
  Filled 2024-02-01 (×2): qty 0.4

## 2024-02-01 NOTE — Plan of Care (Signed)
  Problem: Education: Goal: Ability to describe self-care measures that may prevent or decrease complications (Diabetes Survival Skills Education) will improve Outcome: Progressing Goal: Individualized Educational Video(s) Outcome: Progressing   Problem: Coping: Goal: Ability to adjust to condition or change in health will improve Outcome: Progressing   Problem: Fluid Volume: Goal: Ability to maintain a balanced intake and output will improve Outcome: Progressing   Problem: Metabolic: Goal: Ability to maintain appropriate glucose levels will improve Outcome: Progressing   Problem: Nutritional: Goal: Maintenance of adequate nutrition will improve Outcome: Progressing Goal: Progress toward achieving an optimal weight will improve Outcome: Progressing   Problem: Skin Integrity: Goal: Risk for impaired skin integrity will decrease Outcome: Progressing

## 2024-02-01 NOTE — Progress Notes (Signed)
 PROGRESS NOTE     Patient Demographics:    David Wyatt, is a 63 y.o. male, DOB - Mar 04, 1961, FMW:[id number]  Outpatient Primary MD for the patient is Newlin, Enobong, MD    LOS - 5  Admit date - 01/27/2024    Chief Complaint  Patient presents with   Fatigue       Brief Narrative (HPI from H&P)   63 y.o. male with medical history significant of insulin -dependent diabetes type 2, polysubstance abuse, medication noncompliance, homelessness, who has had recurrent hospitalizations and presented to the ER the most recent being 4 days ago with hyperglycemia and difficulty controlling his blood sugar is here again with blood sugar of more than 700.  Patient was discharged home with relevant prescriptions.  Could not tell this time around why he did not use his insulin .  He previously could not use it because he lost his keys.  Patient was seen here in the ER not in DKA.  pH is 7.3.  Patient also is generally weak.  He is withdrawn and not answering questions adequately.  Beta-hydroxybutyrate acid was negative.  His anion gap is 15.  Appears to have hyperosmolar hyperglycemia.  Patient has been initiated on IV insulin  and IV fluid in the ER.    Subjective:   Patient in bed, appears comfortable, denies any headache, no fever, no chest pain or pressure, no shortness of breath , no abdominal pain. No new focal weakness.   Assessment  & Plan :   #1  Nonketotic hyperosmolar state in a patient with DM type II due to noncompliance with insulin : In a patient with known history of DM type II who ran out of his insulin  due to insurance and homelessness issues, has been placed on subcu insulin -dose adjusted further on 02/01/2024, counseled,  diabetic education, he is eating a lot of cookies and soda drinks throughout the night, counseled on dietary compliance.  Lab Results  Component Value Date   HGBA1C 12.4 (H) 12/06/2023   CBG (last 3)  Recent Labs    01/31/24 2045 01/31/24 2329 02/01/24 0758  GLUCAP 136*  222* 320*    #2.  Poorly controlled essential hypertension: On Norvasc , have added Coreg  and Imdur .  Much improved.   #3 polysubstance abuse: Has been extensively counseled   #4 homelessness: Child psychotherapist consult   #5 pseudohyponatremia: Due to #1 above resolved   #6  Dehydration with AKI on CKD 3 A: IVF, and monitor. Creatinine close to 1.3.  Now close to baseline        Condition - Fair  Family Communication  :  None  Code Status :  Full  Consults  :  None  PUD Prophylaxis :    Procedures  :            Disposition Plan  :    Status is: Inpatient  DVT Prophylaxis  :    enoxaparin  (LOVENOX ) injection 40 mg Start: 01/28/24 1000    Lab Results  Component Value Date   PLT 304 01/28/2024    Diet :  Diet Order             Diet Carb Modified Fluid consistency: Thin; Room service appropriate? Yes  Diet effective now                    Inpatient Medications  Scheduled Meds:  amLODipine   10 mg Oral Daily   atorvastatin   40 mg Oral Daily   carvedilol   3.125 mg Oral BID WC   DULoxetine   30 mg Oral q AM   DULoxetine   60 mg Oral QHS   enoxaparin  (LOVENOX ) injection  40 mg Subcutaneous Daily   folic acid   1 mg Oral Daily   gabapentin   300 mg Oral TID   insulin  aspart  0-15 Units Subcutaneous TID WC   insulin  aspart  0-5 Units Subcutaneous QHS   insulin  aspart  5 Units Subcutaneous TID WC   insulin  glargine-yfgn  40 Units Subcutaneous Daily   isosorbide  mononitrate  30 mg Oral Daily   Continuous Infusions:   PRN Meds:.dextrose , traMADol , traZODone   Antibiotics  :    Anti-infectives (From admission, onward)    None         Objective:   Vitals:   01/31/24 2000  02/01/24 0000 02/01/24 0400 02/01/24 0756  BP: 131/84 108/75 124/80 120/76  Pulse: 68 63 69   Resp: 20 20 20    Temp: 99 F (37.2 C) 98.7 F (37.1 C) 98.7 F (37.1 C) 98.9 F (37.2 C)  TempSrc: Oral Oral Oral Oral  SpO2: 95% (!) 86% (!) 88%   Weight:      Height:        Wt Readings from Last 3 Encounters:  01/27/24 75 kg  01/23/24 72 kg  01/22/24 68 kg     Intake/Output Summary (Last 24 hours) at 02/01/2024 0853 Last data filed at 02/01/2024 0200 Gross per 24 hour  Intake --  Output 500 ml  Net -500 ml     Physical Exam  Awake Alert, No new F.N deficits, Normal affect [place].AT,PERRAL Supple Neck, No JVD,   Symmetrical Chest wall movement, Good air movement bilaterally, CTAB RRR,No Gallops,Rubs or new Murmurs,  +ve B.Sounds, Abd Soft, No tenderness,   No Cyanosis, Clubbing or edema      Data Review:    Recent Labs  Lab 01/27/24 1822 01/27/24 1834 01/28/24 0135  WBC 6.6  --  8.3  HGB 13.1 13.6 12.9*  HCT 40.0 40.0 39.8  PLT 333  --  304  MCV 95.5  --  95.0  MCH 31.3  --  30.8  MCHC 32.8  --  32.4  RDW 13.2  --  13.1  LYMPHSABS 1.9  --   --   MONOABS 0.4  --   --   EOSABS 0.2  --   --   BASOSABS 0.1  --   --     Recent Labs  Lab 01/27/24 1822 01/27/24 1834 01/27/24 1845 01/27/24 2146 01/28/24 0135 01/29/24 0240 01/30/24 0336 01/31/24 0339  NA 133* 133*  --   --  142 133* 134* 133*  K 5.3* 5.0  --   --  3.9 4.2 3.9 4.1  CL 97*  --   --   --  108 100 102 99  CO2 21*  --   --   --  22 21* 20* 21*  ANIONGAP 15  --   --   --  12 12 12 13   GLUCOSE 752*  --   --   --  151* 444* 300* 500*  BUN 17  --   --   --  17 13 15 14   CREATININE 1.38*  --   --   --  1.02 1.31* 1.08 1.30*  AST 32  --   --   --   --   --   --   --   ALT 44  --   --   --   --   --   --   --   ALKPHOS 127*  --   --   --   --   --   --   --   BILITOT 0.9  --   --   --   --   --   --   --   ALBUMIN 3.4*  --   --   --   --   --   --   --   LATICACIDVEN  --   --  3.2* 1.9  --   --    --   --   CALCIUM  9.3  --   --   --  9.3 8.4* 8.5* 8.7*      Recent Labs  Lab 01/27/24 1822 01/27/24 1845 01/27/24 2146 01/28/24 0135 01/29/24 0240 01/30/24 0336 01/31/24 0339  LATICACIDVEN  --  3.2* 1.9  --   --   --   --   CALCIUM  9.3  --   --  9.3 8.4* 8.5* 8.7*    --------------------------------------------------------------------------------------------------------------- Lab Results  Component Value Date   CHOL 197 10/23/2023   HDL 62 10/23/2023   LDLCALC 112 (H) 10/23/2023   TRIG 113 10/23/2023   CHOLHDL 3.2 10/23/2023    Lab Results  Component Value Date   HGBA1C 12.4 (H) 12/06/2023   No results for input(s): TSH, T4TOTAL, FREET4, T3FREE, THYROIDAB in the last 72 hours. No results for input(s): VITAMINB12, FOLATE, FERRITIN, TIBC, IRON, RETICCTPCT in the last 72 hours. ------------------------------------------------------------------------------------------------------------------ Cardiac Enzymes No results for input(s): CKMB, TROPONINI, MYOGLOBIN in the last 168 hours.  Invalid input(s): CK  Micro Results No results found for this or any previous visit (from the past 240 hours).  Radiology Report No results found.   Signature  -   Lavada Stank M.D on 02/01/2024 at 8:53 AM   -  To page go to [email or web address]

## 2024-02-01 NOTE — Plan of Care (Signed)
  Problem: Education: Goal: Ability to describe self-care measures that may prevent or decrease complications (Diabetes Survival Skills Education) will improve Outcome: Progressing Goal: Individualized Educational Video(s) Outcome: Progressing   Problem: Coping: Goal: Ability to adjust to condition or change in health will improve Outcome: Progressing   Problem: Health Behavior/Discharge Planning: Goal: Ability to identify and utilize available resources and services will improve Outcome: Progressing Goal: Ability to manage health-related needs will improve Outcome: Progressing   Problem: Metabolic: Goal: Ability to maintain appropriate glucose levels will improve Outcome: Progressing   Problem: Nutritional: Goal: Maintenance of adequate nutrition will improve Outcome: Progressing   Problem: Skin Integrity: Goal: Risk for impaired skin integrity will decrease Outcome: Progressing   Problem: Metabolic: Goal: Ability to maintain appropriate glucose levels will improve Outcome: Progressing   Problem: Nutritional: Goal: Maintenance of adequate nutrition will improve Outcome: Progressing   Problem: Clinical Measurements: Goal: Ability to maintain clinical measurements within normal limits will improve Outcome: Progressing

## 2024-02-02 ENCOUNTER — Other Ambulatory Visit (HOSPITAL_COMMUNITY): Payer: Self-pay

## 2024-02-02 DIAGNOSIS — E1100 Type 2 diabetes mellitus with hyperosmolarity without nonketotic hyperglycemic-hyperosmolar coma (NKHHC): Secondary | ICD-10-CM | POA: Diagnosis not present

## 2024-02-02 LAB — GLUCOSE, CAPILLARY
Glucose-Capillary: 104 mg/dL — ABNORMAL HIGH (ref 70–99)
Glucose-Capillary: 260 mg/dL — ABNORMAL HIGH (ref 70–99)

## 2024-02-02 MED ORDER — INSULIN ASPART 100 UNIT/ML FLEXPEN
PEN_INJECTOR | SUBCUTANEOUS | 0 refills | Status: DC
  Filled 2024-02-02: qty 3, 30d supply, fill #0

## 2024-02-02 MED ORDER — BLOOD GLUCOSE TEST VI STRP
1.0000 | ORAL_STRIP | Freq: Three times a day (TID) | 0 refills | Status: DC
  Filled 2024-02-02: qty 100, 34d supply, fill #0

## 2024-02-02 MED ORDER — AMLODIPINE BESYLATE 10 MG PO TABS
10.0000 mg | ORAL_TABLET | Freq: Every day | ORAL | 0 refills | Status: DC
  Filled 2024-02-02: qty 30, 30d supply, fill #0

## 2024-02-02 MED ORDER — LANCETS MISC
1.0000 | 0 refills | Status: DC
  Filled 2024-02-02: qty 100, fill #0

## 2024-02-02 MED ORDER — ISOSORBIDE MONONITRATE ER 30 MG PO TB24
30.0000 mg | ORAL_TABLET | Freq: Every day | ORAL | 0 refills | Status: DC
  Filled 2024-02-02: qty 30, 30d supply, fill #0

## 2024-02-02 MED ORDER — INSULIN GLARGINE-YFGN 100 UNIT/ML ~~LOC~~ SOLN
45.0000 [IU] | Freq: Every day | SUBCUTANEOUS | Status: DC
  Administered 2024-02-02: 45 [IU] via SUBCUTANEOUS
  Filled 2024-02-02: qty 0.45

## 2024-02-02 MED ORDER — INSULIN PEN NEEDLE 32G X 4 MM MISC
1.0000 | Freq: Three times a day (TID) | 0 refills | Status: DC
  Filled 2024-02-02: qty 100, 25d supply, fill #0

## 2024-02-02 MED ORDER — BLOOD GLUCOSE MONITOR SYSTEM W/DEVICE KIT
1.0000 | PACK | Freq: Three times a day (TID) | 0 refills | Status: DC
  Filled 2024-02-02: qty 1, 30d supply, fill #0

## 2024-02-02 MED ORDER — ATORVASTATIN CALCIUM 40 MG PO TABS
40.0000 mg | ORAL_TABLET | Freq: Every day | ORAL | 0 refills | Status: DC
  Filled 2024-02-02: qty 30, 30d supply, fill #0

## 2024-02-02 MED ORDER — CARVEDILOL 3.125 MG PO TABS
3.1250 mg | ORAL_TABLET | Freq: Two times a day (BID) | ORAL | 0 refills | Status: DC
  Filled 2024-02-02: qty 60, 30d supply, fill #0

## 2024-02-02 MED ORDER — LANCET DEVICE MISC
1.0000 | Freq: Three times a day (TID) | 0 refills | Status: DC
  Filled 2024-02-02: qty 1, 30d supply, fill #0

## 2024-02-02 MED ORDER — INSULIN GLARGINE 100 UNIT/ML SOLOSTAR PEN
40.0000 [IU] | PEN_INJECTOR | Freq: Every day | SUBCUTANEOUS | 0 refills | Status: DC
  Filled 2024-02-02: qty 15, 37d supply, fill #0

## 2024-02-02 NOTE — Discharge Summary (Signed)
 Discharge summary note.  David Wyatt FMW:[id number] DOB: February 05, 1961 DOA: 01/27/2024  PCP: Delbert Clam, MD  Admit date: 01/27/2024  Discharge date: 02/02/2024  Admitted From: Home   Disposition:  Home   Recommendations for Outpatient Follow-up:   Follow up with PCP in 1-2 weeks  PCP Please obtain BMP/CBC, 2 view CXR in 1week,  (see Discharge instructions)   PCP Please follow up on the following pending results:    Home Health: None   Equipment/Devices: None  Consultations: None  Discharge Condition: Stable    CODE STATUS: Full    Diet Recommendation: Heart Healthy Low Carb    Chief Complaint  Patient presents with   Fatigue     Brief history of present illness from the day of admission and additional interim summary    63 y.o. male with medical history significant of insulin -dependent diabetes type 2, polysubstance abuse, medication noncompliance, homelessness, who has had recurrent hospitalizations and presented to the ER the most recent being 4 days ago with hyperglycemia and difficulty controlling his blood sugar is here again with blood sugar of more than 700.  Patient was discharged home with relevant prescriptions.  Could not tell this time around why he did not use his insulin .  He previously could not use it because he lost his keys.  Patient was seen here in the ER not in DKA.  pH is 7.3.  Patient also is generally weak.  He is withdrawn and not answering questions adequately.  Beta-hydroxybutyrate acid was negative.  His anion gap is 15.  Appears to have hyperosmolar hyperglycemia.  Patient has been initiated on IV insulin  and IV fluid in the ER                                                                  Hospital Course   #1  Nonketotic hyperosmolar state in a patient with DM type II  due to noncompliance with insulin : In a patient with known history of DM type II who ran out of his insulin  due to insurance and homelessness issues, has been placed on subcu insulin -dose adjusted further on 02/01/2024, counseled, diabetic education, he is eating a lot of cookies and soda drinks throughout the night, counseled on dietary compliance.  Insulin  adjusted further on 02/02/2024.  Requested to check CBGs q. ACHS maintain a logbook ensured to PCP in a week for further adjustment.  Lab Results  Component Value Date   HGBA1C 12.4 (H) 12/06/2023   CBG (last 3)  Recent Labs    02/01/24 2001 02/02/24 0648 02/02/24 0735  GLUCAP 253* 104* 260*    #2.  Poorly controlled essential hypertension: On Norvasc , have added Coreg  and Imdur .  Much improved.   #3 polysubstance abuse: Has been extensively counseled   #4 homelessness: Social  worker consult   #5 pseudohyponatremia: Due to #1 above resolved   #6  Dehydration with AKI on CKD 3 A: IVF, and monitor. Creatinine close to 1.3.  Now close to baseline  Discharge diagnosis     Principal Problem:   Hyperosmolar hyperglycemic state (HHS) (HCC) Active Problems:   Depression with suicidal ideation   Essential hypertension, benign   Diabetic neuropathy (HCC)   Hyperlipidemia   GERD (gastroesophageal reflux disease)   Polysubstance abuse (HCC)   Tobacco abuse   Hyperglycemia    Discharge instructions    Discharge Instructions     Discharge instructions   Complete by: As directed    Follow with Primary MD Delbert Clam, MD in 7 days   Get CBC, CMP, Magnesium , 2 view Chest X ray -  checked next visit with your primary MD   Activity: As tolerated with Full fall precautions use walker/cane & assistance as needed  Disposition Home    Diet: Heart Healthy Low Carb  Accuchecks 4 times/day, Once in AM empty stomach and then before each meal. Log in all results and show them to your Prim.MD in 3 days. If any glucose reading  is under 80 or above 300 call your Prim MD immidiately. Follow Low glucose instructions for glucose under 80 as instructed.   Special Instructions: If you have smoked or chewed Tobacco  in the last 2 yrs please stop smoking, stop any regular Alcohol  and or any Recreational drug use.  On your next visit with your primary care physician please Get Medicines reviewed and adjusted.  Please request your Prim.MD to go over all Hospital Tests and Procedure/Radiological results at the follow up, please get all Hospital records sent to your Prim MD by signing hospital release before you go home.  If you experience worsening of your admission symptoms, develop shortness of breath, life threatening emergency, suicidal or homicidal thoughts you must seek medical attention immediately by calling 911 or calling your MD immediately  if symptoms less severe.  You Must read complete instructions/literature along with all the possible adverse reactions/side effects for all the Medicines you take and that have been prescribed to you. Take any new Medicines after you have completely understood and accpet all the possible adverse reactions/side effects.   Do not drive when taking Pain medications.  Do not take more than prescribed Pain, Sleep and Anxiety Medications  Wear Seat belts while driving.   Increase activity slowly   Complete by: As directed        Discharge Medications   Allergies as of 02/02/2024       Reactions   Lisinopril  Anaphylaxis, Swelling, Other (See Comments)   Lip Swelling and angioedema, too        Medication List     STOP taking these medications    Accu-Chek Guide Me w/Device Kit   metFORMIN  1000 MG tablet Commonly known as: GLUCOPHAGE        TAKE these medications    Accu-Chek Guide Test test strip Generic drug: glucose blood Use as instructed to check blood sugar three times daily   Accu-Chek Softclix Lancets lancets Use to check blood sugar three times  daily as directed   amLODipine  10 MG tablet Commonly known as: NORVASC  Take 1 tablet (10 mg total) by mouth daily. Start taking on: February 03, 2024 What changed:  medication strength how much to take   atorvastatin  40 MG tablet Commonly known as: LIPITOR Take 1 tablet (40 mg total) by mouth daily.  carvedilol  3.125 MG tablet Commonly known as: COREG  Take 1 tablet (3.125 mg total) by mouth 2 (two) times daily with a meal.   CertaVite/Antioxidants Tabs Take 1 tablet by mouth daily.   DULoxetine  60 MG capsule Commonly known as: CYMBALTA  Take 60 mg by mouth daily. What changed: Another medication with the same name was removed. Continue taking this medication, and follow the directions you see here.   folic acid  1 MG tablet Commonly known as: FOLVITE  Take 1 tablet (1 mg total) by mouth daily.   gabapentin  300 MG capsule Commonly known as: NEURONTIN  Take 1 capsule (300 mg total) by mouth 3 (three) times daily.   insulin  aspart 100 UNIT/ML FlexPen Commonly known as: NOVOLOG  Before each meal 3 times a day, 140-199 - 2 units, 200-250 - 6 units, 251-299 - 8 units,  300-349 - 12 units,  350 or above 14 units. What changed:  how much to take how to take this when to take this additional instructions   insulin  glargine 100 UNIT/ML Solostar Pen Commonly known as: LANTUS  Inject 40 Units into the skin at bedtime. What changed: how much to take   Insulin  Pen Needle 32G X 4 MM Misc Use 1 each to inject insulin  into the skin 4 (four) times daily -  before meals and at bedtime.   isosorbide  mononitrate 30 MG 24 hr tablet Commonly known as: IMDUR  Take 1 tablet (30 mg total) by mouth daily. Start taking on: February 03, 2024   thiamine  100 MG tablet Commonly known as: VITAMIN B1 Take 1 tablet (100 mg total) by mouth daily.   traZODone  50 MG tablet Commonly known as: DESYREL  Take 1 tablet (50 mg total) by mouth at bedtime as needed for sleep.         Follow-up  Information     Delbert Clam, MD. Schedule an appointment as soon as possible for a visit in 1 week(s).   Specialty: Family Medicine Contact information: [address] [place] [address] [phone]         [facility]. Schedule an appointment as soon as possible for a visit in 1 week(s).   Contact information: [address] [place]  [address] [phone]                Major procedures and Radiology Reports - PLEASE review detailed and final reports thoroughly  -       No results found.  Micro Results    No results found for this or any previous visit (from the past 240 hours).  Today   Subjective    David Wyatt today has no headache,no chest abdominal pain,no new weakness tingling or numbness, feels much better wants to go home today.    Objective   Blood pressure 132/79, pulse 97, temperature 98.5 F (36.9 C), temperature source Oral, resp. rate 18, height 5' 8 (1.727 m), weight 75 kg, SpO2 96%.   Intake/Output Summary (Last 24 hours) at 02/02/2024 0853 Last data filed at 02/02/2024 0641 Gross per 24 hour  Intake 240 ml  Output 2400 ml  Net -2160 ml    Exam  Awake Alert, No new F.N deficits,    [place].AT,PERRAL Supple Neck,   Symmetrical Chest wall movement, Good air movement bilaterally, CTAB RRR,No Gallops,   +ve B.Sounds, Abd Soft, Non tender,  No Cyanosis, Clubbing or edema    Data Review   Recent Labs  Lab 01/27/24 1822 01/27/24 1834  01/28/24 0135  WBC 6.6  --  8.3  HGB 13.1 13.6 12.9*  HCT 40.0 40.0 39.8  PLT 333  --  304  MCV 95.5  --  95.0  MCH 31.3  --  30.8  MCHC 32.8  --  32.4  RDW 13.2  --  13.1  LYMPHSABS 1.9  --   --   MONOABS 0.4  --   --   EOSABS 0.2  --   --   BASOSABS 0.1  --   --     Recent Labs  Lab 01/27/24 1822 01/27/24 1834 01/27/24 1845 01/27/24 2146 01/28/24 0135 01/29/24 0240 01/30/24 0336 01/31/24 0339   NA 133* 133*  --   --  142 133* 134* 133*  K 5.3* 5.0  --   --  3.9 4.2 3.9 4.1  CL 97*  --   --   --  108 100 102 99  CO2 21*  --   --   --  22 21* 20* 21*  ANIONGAP 15  --   --   --  12 12 12 13   GLUCOSE 752*  --   --   --  151* 444* 300* 500*  BUN 17  --   --   --  17 13 15 14   CREATININE 1.38*  --   --   --  1.02 1.31* 1.08 1.30*  AST 32  --   --   --   --   --   --   --   ALT 44  --   --   --   --   --   --   --   ALKPHOS 127*  --   --   --   --   --   --   --   BILITOT 0.9  --   --   --   --   --   --   --   ALBUMIN 3.4*  --   --   --   --   --   --   --   LATICACIDVEN  --   --  3.2* 1.9  --   --   --   --   CALCIUM  9.3  --   --   --  9.3 8.4* 8.5* 8.7*    Total Time in preparing paper work, data evaluation and todays exam - 35 minutes  Signature  -    Lavada Stank M.D on 02/02/2024 at 8:53 AM   -  To page go to [email or web address]

## 2024-02-02 NOTE — Plan of Care (Signed)
   Problem: Education: Goal: Ability to describe self-care measures that may prevent or decrease complications (Diabetes Survival Skills Education) will improve Outcome: Progressing   Problem: Coping: Goal: Ability to adjust to condition or change in health will improve Outcome: Progressing   Problem: Fluid Volume: Goal: Ability to maintain a balanced intake and output will improve Outcome: Progressing   Problem: Metabolic: Goal: Ability to maintain appropriate glucose levels will improve Outcome: Progressing   Problem: Nutritional: Goal: Maintenance of adequate nutrition will improve Outcome: Progressing Goal: Progress toward achieving an optimal weight will improve Outcome: Progressing   Problem: Skin Integrity: Goal: Risk for impaired skin integrity will decrease Outcome: Progressing   Problem: Tissue Perfusion: Goal: Adequacy of tissue perfusion will improve Outcome: Progressing

## 2024-02-02 NOTE — Progress Notes (Signed)
 DISCHARGE NOTE HOME David Wyatt to be discharged Home per MD order. Discussed prescriptions and follow up appointments with the patient. Prescriptions given to patient; medication list explained in detail. Patient verbalized understanding.  Skin clean, dry and intact without evidence of skin break down, no evidence of skin tears noted. IV catheter discontinued intact. Site without signs and symptoms of complications. Dressing and pressure applied. Pt denies pain at the site currently. No complaints noted.  Patient free of lines, drains, and wounds.   An After Visit Summary (AVS) was printed and given to the patient. Patient escorted via wheelchair, to discharge lounge to wait for TOC meds and taxi to [facility]   [name] SHAUNNA Pepper, RN

## 2024-02-02 NOTE — TOC Transition Note (Addendum)
 Transition of Care [facility]) - Discharge Note   Patient Details  Name: David Wyatt MRN: [id number] Date of Birth: [birth year]  Transition of Care [facility]) CM/SW Contact:  Landry DELENA Senters, RN Phone Number: 02/02/2024, 9:24 AM   Clinical Narrative:    Patient to discharge today to [facility]. Discharge lounge to provide patient with cab voucher. CBG meter to be billed to patient, will be delivered to d/c lounge.   No further needs identified by CM.    Final next level of care: Other (comment) Barriers to Discharge: No Barriers Identified   Patient Goals and CMS Choice Patient states their goals for this hospitalization and ongoing recovery are:: Discharge with increased housing supports   Choice offered to / list presented to : NA      Discharge Placement                       Discharge Plan and Services Additional resources added to the After Visit Summary for   In-house Referral: Clinical Social Work   Post Acute Care Choice: NA          DME Arranged: Vannie rolling DME Agency: [employer]       HH Arranged: NA HH Agency: NA        Social Drivers of Health (SDOH) Interventions SDOH Screenings   Food Insecurity: Food Insecurity Present (01/28/2024)  Housing: High Risk (01/28/2024)  Transportation Needs: Unmet Transportation Needs (01/28/2024)  Utilities: Not At Risk (01/28/2024)  Recent Concern: Utilities - At Risk (12/07/2023)  Alcohol Screen: Low Risk  (12/07/2023)  Depression (PHQ2-9): Medium Risk (12/06/2023)  Financial Resource Strain: High Risk (01/15/2024)  Social Connections: Unknown (10/24/2023)  Tobacco Use: Medium Risk (01/27/2024)     Readmission Risk Interventions     No data to display

## 2024-02-02 NOTE — Discharge Instructions (Signed)
 Follow with Primary MD Delbert Clam, MD in 7 days   Get CBC, CMP, Magnesium , 2 view Chest X ray -  checked next visit with your primary MD   Activity: As tolerated with Full fall precautions use walker/cane & assistance as needed  Disposition Home    Diet: Heart Healthy Low Carb  Accuchecks 4 times/day, Once in AM empty stomach and then before each meal. Log in all results and show them to your Prim.MD in 3 days. If any glucose reading is under 80 or above 300 call your Prim MD immidiately. Follow Low glucose instructions for glucose under 80 as instructed.   Special Instructions: If you have smoked or chewed Tobacco  in the last 2 yrs please stop smoking, stop any regular Alcohol  and or any Recreational drug use.  On your next visit with your primary care physician please Get Medicines reviewed and adjusted.  Please request your Prim.MD to go over all Hospital Tests and Procedure/Radiological results at the follow up, please get all Hospital records sent to your Prim MD by signing hospital release before you go home.  If you experience worsening of your admission symptoms, develop shortness of breath, life threatening emergency, suicidal or homicidal thoughts you must seek medical attention immediately by calling 911 or calling your MD immediately  if symptoms less severe.  You Must read complete instructions/literature along with all the possible adverse reactions/side effects for all the Medicines you take and that have been prescribed to you. Take any new Medicines after you have completely understood and accpet all the possible adverse reactions/side effects.   Do not drive when taking Pain medications.  Do not take more than prescribed Pain, Sleep and Anxiety Medications  Wear Seat belts while driving.

## 2024-02-03 ENCOUNTER — Other Ambulatory Visit: Payer: Self-pay

## 2024-02-03 ENCOUNTER — Other Ambulatory Visit: Payer: Self-pay | Admitting: Physician Assistant

## 2024-02-03 ENCOUNTER — Telehealth: Payer: Self-pay | Admitting: *Deleted

## 2024-02-03 ENCOUNTER — Encounter: Payer: Self-pay | Admitting: Physician Assistant

## 2024-02-03 MED ORDER — ACCU-CHEK GUIDE TEST VI STRP
ORAL_STRIP | 1 refills | Status: AC
  Filled 2024-02-03 – 2024-02-04 (×2): qty 100, 33d supply, fill #0

## 2024-02-03 MED ORDER — ACCU-CHEK SOFTCLIX LANCETS MISC
5 refills | Status: AC
  Filled 2024-02-03: qty 100, 33d supply, fill #0

## 2024-02-03 MED ORDER — ISOSORBIDE MONONITRATE ER 30 MG PO TB24
30.0000 mg | ORAL_TABLET | Freq: Every day | ORAL | 1 refills | Status: DC
  Filled 2024-02-03 (×2): qty 30, 30d supply, fill #0

## 2024-02-03 MED ORDER — INSULIN ASPART 100 UNIT/ML FLEXPEN
PEN_INJECTOR | SUBCUTANEOUS | 1 refills | Status: AC
  Filled 2024-02-03 (×2): qty 6, 50d supply, fill #0

## 2024-02-03 MED ORDER — ATORVASTATIN CALCIUM 40 MG PO TABS
40.0000 mg | ORAL_TABLET | Freq: Every day | ORAL | 1 refills | Status: DC
  Filled 2024-02-03 (×2): qty 30, 30d supply, fill #0

## 2024-02-03 MED ORDER — INSULIN GLARGINE 100 UNIT/ML SOLOSTAR PEN
40.0000 [IU] | PEN_INJECTOR | Freq: Every day | SUBCUTANEOUS | 3 refills | Status: DC
  Filled 2024-02-03 (×2): qty 12, 30d supply, fill #0

## 2024-02-03 MED ORDER — INSULIN PEN NEEDLE 32G X 4 MM MISC
1.0000 | Freq: Three times a day (TID) | 0 refills | Status: DC
  Filled 2024-02-03 (×2): qty 100, 25d supply, fill #0
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
## 2024-02-03 NOTE — Transitions of Care (Post Inpatient/ED Visit) (Signed)
   02/03/2024  Name: David Wyatt MRN: [id number] DOB: 28-Nov-1960  Today's TOC FU Call Status: Today's TOC FU Call Status:: Unsuccessful Call (1st Attempt) Unsuccessful Call (1st Attempt) Date: 02/03/24  Attempted to reach the patient regarding the most recent Inpatient/ED visit.  Follow Up Plan: Additional outreach attempts will be made to reach the patient to complete the Transitions of Care (Post Inpatient/ED visit) call.   Andrea Dimes RN, BSN [place]  [facility] RN Care Manager [phone]

## 2024-02-03 NOTE — Progress Notes (Signed)
 Meds refilled.

## 2024-02-04 ENCOUNTER — Other Ambulatory Visit: Payer: Self-pay | Admitting: Critical Care Medicine

## 2024-02-04 ENCOUNTER — Emergency Department (HOSPITAL_COMMUNITY)
Admission: EM | Admit: 2024-02-04 | Discharge: 2024-02-04 | Disposition: A | Discharge status: Home / Self Care | Payer: MEDICAID | Attending: Emergency Medicine | Admitting: Emergency Medicine

## 2024-02-04 ENCOUNTER — Other Ambulatory Visit: Payer: Self-pay

## 2024-02-04 ENCOUNTER — Telehealth: Payer: Self-pay

## 2024-02-04 ENCOUNTER — Encounter: Payer: Self-pay | Admitting: *Deleted

## 2024-02-04 DIAGNOSIS — Z794 Long term (current) use of insulin: Secondary | ICD-10-CM | POA: Diagnosis not present

## 2024-02-04 DIAGNOSIS — E1165 Type 2 diabetes mellitus with hyperglycemia: Secondary | ICD-10-CM | POA: Insufficient documentation

## 2024-02-04 DIAGNOSIS — N390 Urinary tract infection, site not specified: Secondary | ICD-10-CM | POA: Insufficient documentation

## 2024-02-04 DIAGNOSIS — R739 Hyperglycemia, unspecified: Secondary | ICD-10-CM

## 2024-02-04 LAB — URINALYSIS, ROUTINE W REFLEX MICROSCOPIC
Bilirubin Urine: NEGATIVE
Glucose, UA: >=500 mg/dL — AB
Hgb urine dipstick: NEGATIVE
Ketones, ur: NEGATIVE mg/dL
Nitrite: POSITIVE — AB
Protein, ur: 30 mg/dL — AB
Specific Gravity, Urine: 1.023 (ref 1.005–1.030)
WBC, UA: >50 WBC/hpf (ref 0–5)
pH: 5 (ref 5.0–8.0)

## 2024-02-04 LAB — CBC
HCT: 36.7 % — ABNORMAL LOW (ref 39.0–52.0)
Hemoglobin: 11.6 g/dL — ABNORMAL LOW (ref 13.0–17.0)
MCH: 30.4 pg (ref 26.0–34.0)
MCHC: 31.6 g/dL (ref 30.0–36.0)
MCV: 96.3 fL (ref 80.0–100.0)
Platelets: 385 K/uL (ref 150–400)
RBC: 3.81 MIL/uL — ABNORMAL LOW (ref 4.22–5.81)
RDW: 12.9 % (ref 11.5–15.5)
WBC: 9.7 K/uL (ref 4.0–10.5)
nRBC: 0 % (ref 0.0–0.2)

## 2024-02-04 LAB — COMPREHENSIVE METABOLIC PANEL WITH GFR
ALT: 22 U/L (ref 0–44)
AST: 18 U/L (ref 15–41)
Albumin: 3.6 g/dL (ref 3.5–5.0)
Alkaline Phosphatase: 113 U/L (ref 38–126)
Anion gap: 10 (ref 5–15)
BUN: 19 mg/dL (ref 8–23)
CO2: 26 mmol/L (ref 22–32)
Calcium: 9.2 mg/dL (ref 8.9–10.3)
Chloride: 100 mmol/L (ref 98–111)
Creatinine, Ser: 1.15 mg/dL (ref 0.61–1.24)
GFR, Estimated: >60 mL/min (ref >60)
Glucose, Bld: 455 mg/dL — ABNORMAL HIGH (ref 70–99)
Potassium: 4.5 mmol/L (ref 3.5–5.1)
Sodium: 136 mmol/L (ref 135–145)
Total Bilirubin: 0.3 mg/dL (ref 0.0–1.2)
Total Protein: 7.3 g/dL (ref 6.5–8.1)

## 2024-02-04 LAB — HIV ANTIBODY (ROUTINE TESTING W REFLEX): HIV Screen 4th Generation wRfx: NONREACTIVE

## 2024-02-04 LAB — CBG MONITORING, ED
Glucose-Capillary: 426 mg/dL — ABNORMAL HIGH (ref 70–99)
Glucose-Capillary: 210 mg/dL — ABNORMAL HIGH (ref 70–99)

## 2024-02-04 MED ORDER — CEPHALEXIN 500 MG PO CAPS
500.0000 mg | ORAL_CAPSULE | Freq: Four times a day (QID) | ORAL | 0 refills | Status: DC
  Filled 2024-02-04: qty 28, 7d supply, fill #0

## 2024-02-04 MED ORDER — INSULIN ASPART 100 UNIT/ML IJ SOLN
12.0000 [IU] | Freq: Once | INTRAMUSCULAR | Status: AC
  Administered 2024-02-04: 12 [IU] via SUBCUTANEOUS
  Filled 2024-02-04: qty 12

## 2024-02-04 MED ORDER — TRESIBA FLEXTOUCH 100 UNIT/ML ~~LOC~~ SOPN
40.0000 [IU] | PEN_INJECTOR | Freq: Every day | SUBCUTANEOUS | 1 refills | Status: DC
  Filled 2024-02-04: qty 6, 15d supply, fill #0

## 2024-02-04 MED ORDER — CEPHALEXIN 500 MG PO CAPS
500.0000 mg | ORAL_CAPSULE | Freq: Once | ORAL | Status: AC
  Administered 2024-02-04: 500 mg via ORAL
  Filled 2024-02-04: qty 1

## 2024-02-04 MED ORDER — INSULIN LISPRO (1 UNIT DIAL) 100 UNIT/ML (KWIKPEN)
PEN_INJECTOR | SUBCUTANEOUS | 1 refills | Status: DC
  Filled 2024-02-04: qty 3, 25d supply, fill #0

## 2024-02-04 NOTE — Inpatient Diabetes Management (Signed)
 Inpatient Diabetes Program Recommendations  AACE/ADA: New Consensus Statement on Inpatient Glycemic Control (2015)  Target Ranges:  Prepandial:   less than 140 mg/dL      Peak postprandial:   less than 180 mg/dL (1-2 hours)      Critically ill patients:  140 - 180 mg/dL   Lab Results  Component Value Date   GLUCAP 426 (H) 02/04/2024   HGBA1C 12.4 (H) 12/06/2023    Review of Glycemic Control  Diabetes history: DM2 Outpatient Diabetes medications: Lantus  15 units at bedtime, Novolog  10 units TID with meals, Metformin  1000 mg BID  Current orders for Inpatient glycemic control:  Novolog  12 units given @ 1244 for lab glucose of 455.  Inpatient Diabetes Program Recommendations:   Patient was just discharged 02/02/24 and arrived via [facility] this am.  NOTE: Patient admitted with hyperosmolar hyperglycemia with initial glucose 752 mg/dl. Patient is well known to inpatient DM team. Patient has had 40 ED visits in past 6 months and 8 hospital admission over the past 6 months.   Per note 01/29/24, by Dr. Dennise, ran out of his insulin  due to insurance and homelessness issues.    Per outpatient [facility] pharmacy check, Lantus  and Novolog  were both filled 01/24/24 and next fill date is 04/13/24(Lantus ) and 04/17/24(novolog ).   Not sure what can be done to help patient regarding insulin . Also again anticipate drug abuse and  homeless issues impact ability for patient to manage his DM.   Thank you, Marny Smethers E. Neta Upadhyay, RN, MSN, CNS, CDCES  Diabetes Coordinator Inpatient Glycemic Control Team Team Pager [phone] (8am-5pm) 02/04/2024 1:11 PM

## 2024-02-04 NOTE — Transitions of Care (Post Inpatient/ED Visit) (Signed)
   02/04/2024  Name: David Wyatt MRN: [id number] DOB: 04/11/1960  Today's TOC FU Call Status: Today's TOC FU Call Status:: Unsuccessful Call (2nd Attempt) Unsuccessful Call (1st Attempt) Date: 02/03/24 Unsuccessful Call (2nd Attempt) Date: 02/04/24  Attempted to reach the patient regarding the most recent Inpatient/ED visit.  Follow Up Plan: Additional outreach attempts will be made to reach the patient to complete the Transitions of Care (Post Inpatient/ED visit) call.   Medford Balboa, BSN, RN [place]  VBCI - [employer] [facility] RN Care Manager [phone]

## 2024-02-04 NOTE — Discharge Instructions (Signed)
 1.  Continue your insulin  treatment as prescribed.  Work closely with your doctor and care providers to help you obtain the insulin  and to monitor your blood sugars. 2.  Your urine shows signs of an infection.  You have been prescribed Keflex  to take 4 times a day.  Take this until the prescription is done unless you are called by the pharmacy to make a change.

## 2024-02-04 NOTE — ED Triage Notes (Addendum)
 Pt arrives via EMS from [facility]. Pt has elevated glucose and does not understand how to control his glucose, or administer his medications.   EMS glucose 518mg /dL.

## 2024-02-04 NOTE — ED Provider Notes (Signed)
 [place] EMERGENCY DEPARTMENT AT [facility] Provider Note   CSN: [id number] Arrival date & time: 02/04/24  9073     Patient presents with: Hyperglycemia   David Wyatt is a 63 y.o. male.   HPI Patient arrives from [facility].  He has had recurrent difficulty managing his diabetes for various reasons.  Patient was brought by EMS.  Initially there was report that they had difficulty understanding the use of his glucometer and medications.  Patient has had significant assistance and outpatient management.  Patient then later said that he did not have trouble understanding it but that he took his medicine and his blood sugar stayed high and he needs help getting it down.  He does not have other complaints today.  He reports he felt a little fatigued but is not having any other symptoms at this time.    Prior to Admission medications   Medication Sig Start Date End Date Taking? Authorizing Provider  Accu-Chek Softclix Lancets lancets Use as directed. 02/03/24   Barrett, Shona MATSU, PA-C  amLODipine  (NORVASC ) 10 MG tablet Take 1 tablet (10 mg total) by mouth daily. 02/03/24   Singh, Prashant K, MD  atorvastatin  (LIPITOR) 40 MG tablet Take 1 tablet (40 mg total) by mouth daily. 02/03/24   Barrett, Shona MATSU, PA-C  Blood Glucose Monitoring Suppl (BLOOD GLUCOSE MONITOR SYSTEM) w/Device KIT Use in the morning, at noon, and at bedtime. 02/02/24   Dennise Lavada POUR, MD  carvedilol  (COREG ) 3.125 MG tablet Take 1 tablet (3.125 mg total) by mouth 2 (two) times daily with a meal. 02/02/24   Dennise Lavada POUR, MD  DULoxetine  (CYMBALTA ) 60 MG capsule Take 60 mg by mouth daily.    [provider]  folic acid  (FOLVITE ) 1 MG tablet Take 1 tablet (1 mg total) by mouth daily. 01/25/24   Amin, Sumayya, MD  gabapentin  (NEURONTIN ) 300 MG capsule Take 1 capsule (300 mg total) by mouth 3 (three) times daily. 01/24/24   Amin, Sumayya, MD  glucose blood (ACCU-CHEK GUIDE TEST) test strip Use  as instructed to check blood sugar three times daily 02/03/24   Barrett, Rhonda G, PA-C  insulin  aspart (NOVOLOG ) 100 UNIT/ML FlexPen Before each meal 3 times a day, If less than 150 do not give, 151-199 - 2 units, 200-250 - 6 units, 251-299 - 8 units,  300-349 - 10 units,  350 or above 12 units. 02/03/24   Barrett, Shona MATSU, PA-C  insulin  degludec (TRESIBA  FLEXTOUCH) 100 UNIT/ML FlexTouch Pen Inject 40 Units into the skin at bedtime. 02/04/24   Brien Belvie BRAVO, MD  insulin  glargine (LANTUS ) 100 UNIT/ML Solostar Pen Inject 40 Units into the skin at bedtime. 02/03/24   Barrett, Shona MATSU, PA-C  insulin  lispro (HUMALOG ) 100 UNIT/ML KwikPen Before each meal 3 times a day, If less than 150 do not give, 151-199 - 2 units, 200-250 - 6 units, 251-299 - 8 units, 300-349 - 10 units, 350 or above 12 units. 02/04/24   Brien Belvie BRAVO, MD  Insulin  Pen Needle 32G X 4 MM MISC Use 1 each to inject insulin  into the skin 4 (four) times daily -  before meals and at bedtime. 02/03/24   Barrett, Shona MATSU, PA-C  isosorbide  mononitrate (IMDUR ) 30 MG 24 hr tablet Take 1 tablet (30 mg total) by mouth daily. 02/03/24   Barrett, Rhonda G, PA-C  Multiple Vitamin (MULTIVITAMIN WITH MINERALS) TABS tablet Take 1 tablet by mouth daily. 01/25/24   Caleen Qualia, MD  thiamine  (  VITAMIN B1) 100 MG tablet Take 1 tablet (100 mg total) by mouth daily. 01/25/24   Amin, Sumayya, MD  traZODone  (DESYREL ) 50 MG tablet Take 1 tablet (50 mg total) by mouth at bedtime as needed for sleep. 01/24/24   Caleen Qualia, MD    Allergies: Lisinopril     Review of Systems  Updated Vital Signs BP 110/66 (BP Location: Right Arm)   Pulse 81   Temp 98.3 F (36.8 C) (Oral)   Resp 16   SpO2 98%   Physical Exam Constitutional:      Comments: Alert nontoxic clear mental status  HENT:     Mouth/Throat:     Pharynx: Oropharynx is clear.  Eyes:     Extraocular Movements: Extraocular movements intact.  Cardiovascular:     Rate and Rhythm: Normal  rate and regular rhythm.  Pulmonary:     Effort: Pulmonary effort is normal.     Breath sounds: Normal breath sounds.  Abdominal:     General: There is no distension.     Palpations: Abdomen is soft.     Tenderness: There is no abdominal tenderness. There is no guarding.  Musculoskeletal:        General: Normal range of motion.  Skin:    General: Skin is warm and dry.  Neurological:     General: No focal deficit present.     (all labs ordered are listed, but only abnormal results are displayed) Labs Reviewed  CBC - Abnormal; Notable for the following components:      Result Value   RBC 3.81 (*)    Hemoglobin 11.6 (*)    HCT 36.7 (*)    All other components within normal limits  CBG MONITORING, ED - Abnormal; Notable for the following components:   Glucose-Capillary 426 (*)    All other components within normal limits  URINALYSIS, ROUTINE W REFLEX MICROSCOPIC  COMPREHENSIVE METABOLIC PANEL WITH GFR  CBG MONITORING, ED    EKG: None  Radiology: No results found.   Procedures   Medications Ordered in the ED - No data to display                                  Medical Decision Making Amount and/or Complexity of Data Reviewed Labs: ordered.  Risk Prescription drug management.   Patient presents as outlined.  First blood glucose 426.  Metabolic panel shows GFR greater than 60 potassium 4.5 anion gap 10.  No signs of DKA or hyperosmolar crisis today.  Patient's mental status is clear and labs do not reflect this complication.  Will administer subcu insulin  and monitor.  Received messaging from Dr. [name] that patient's medications are being managed and will be obtained for him through social resources with the ministries.  Patient blood sugar improved and he felt well.  Patient has eaten and blood sugar still in 200s.  Otherwise stable.  Will plan for discharge he has a follow-up medical care and social support for obtaining prescriptions.  Incidentally  patient's urine does appear grossly positive with nitrite and leuk esterase as well as greater than 60 WBCs.  Patient does not report symptoms.  Given potential risk for STI will add RPR GC chlamydia and HIV.  Will treat with Keflex  for UTI and obtain urine culture.  Patient does not show any signs of complication at this time.  He does not have flank pain fever or abdominal pain.  Final diagnoses:  Hyperglycemia  Lower urinary tract infectious disease    ED Discharge Orders     None          Armenta Canning, MD 02/08/24 2334

## 2024-02-04 NOTE — Congregational Nurse Program (Signed)
  Dept: [phone]   Congregational Nurse Program Note  Date of Encounter: 02/04/2024  Past Medical History: Past Medical History:  Diagnosis Date   Angioedema of lips 07/28/2012   left upper (07/29/2012)   Arthritis    hands and back   Chronic back pain    Cocaine abuse (HCC)    Diabetic neuropathy (HCC)    High cholesterol    Hypertension    Neuropathy    Type II diabetes mellitus (HCC)     Encounter Details:  Community Questionnaire - 02/04/24 1435       Questionnaire   Ask client: Do you give verbal consent for me to treat you today? 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 He was being transferred by EMS to hospital for elevated blood sugar. Client said he got his insulin  yesterday. Client had reported his medication was missing. Writer was instructed to p/u all medication from pharmacy except insulin . Picked up Imdur , atorvastatin , accu-check test strip 2 boxes, folic acid  and techlite pen needles. Medication left at [facility] front desk with staff including client's name and bed number. Nicholous Girgenti W RN CN

## 2024-02-04 NOTE — Progress Notes (Signed)
 Override Rx

## 2024-02-05 ENCOUNTER — Other Ambulatory Visit: Payer: Self-pay | Admitting: Critical Care Medicine

## 2024-02-05 LAB — SYPHILIS: RPR W/REFLEX TO RPR TITER AND TREPONEMAL ANTIBODIES, TRADITIONAL SCREENING AND DIAGNOSIS ALGORITHM: RPR Ser Ql: NONREACTIVE

## 2024-02-05 MED ORDER — INSULIN PEN NEEDLE 32G X 4 MM MISC
1.0000 | Freq: Three times a day (TID) | 0 refills | Status: DC
  Filled 2024-02-05: qty 100, 25d supply, fill #0

## 2024-02-05 MED ORDER — TRAZODONE HCL 50 MG PO TABS
50.0000 mg | ORAL_TABLET | Freq: Every evening | ORAL | 1 refills | Status: DC | PRN
  Filled 2024-02-05: qty 30, 30d supply, fill #0

## 2024-02-05 MED ORDER — CARVEDILOL 3.125 MG PO TABS
3.1250 mg | ORAL_TABLET | Freq: Two times a day (BID) | ORAL | 0 refills | Status: DC
  Filled 2024-02-05: qty 60, 30d supply, fill #0

## 2024-02-05 MED ORDER — AMLODIPINE BESYLATE 10 MG PO TABS
10.0000 mg | ORAL_TABLET | Freq: Every day | ORAL | 1 refills | Status: DC
  Filled 2024-02-05: qty 30, 30d supply, fill #0

## 2024-02-05 NOTE — Progress Notes (Signed)
 Med refills

## 2024-02-06 ENCOUNTER — Other Ambulatory Visit: Payer: Self-pay

## 2024-02-06 ENCOUNTER — Encounter: Payer: Self-pay | Admitting: Critical Care Medicine

## 2024-02-06 LAB — URINE CULTURE: Culture: >=100000 — AB

## 2024-02-06 LAB — GC/CHLAMYDIA PROBE AMP (~~LOC~~) NOT AT ARMC
Chlamydia: NEGATIVE
Comment: NEGATIVE
Comment: NORMAL
Neisseria Gonorrhea: NEGATIVE

## 2024-02-06 NOTE — Progress Notes (Signed)
 I saw this patient at the [facility] clinic today he was in the ER all day yesterday.  I did a complete and full medication reconciliation I pulled his medication bag he had easily 30 bottles of pills in the bag there were duplications of various medications he told me that he often skips meds because he gets confused about them he does not know what he is taking and why he is taking it.  I also looked at his glucose meter supplies he had a new batch of's meter supplies that we obtain for him but the old supplies showed expired test strips.  I then reviewed with the patient his insulin  he had very poor insight on as to when to take the insulin  I reinstructed him as to the proper use of both the NovoLog  and Lantus  pens his pens and found out he was read he was reusing the lancets in the meter and the pen needles on the flex pens and often did not change them for over a month he also had no alcohol swabs.  I gave the patient a new box of alcohol swabs that showed and how to change the pen needles and how to change the lancets told him he must do this every day we took his blood sugar was 390 at this visit I then gave him 10 units of NovoLog  I watched him self administer it he was not pushing all the way down to inject the entire dose I taught him how to push the FlexPen all the way down so it went to 0 so I do believe he was underdosing himself in this regard.  I then obtained a 4 line pillbox and filled all of his medications I did increase the gabapentin  to 600 mg 3 times daily everything else was as prescribed I showed him his sliding scale for NovoLog  I wrote it out for him and told him to take his sugars before each meal and I wrote out how much insulin  to give each time.  I then told him to take 20 units of Lantus  tonight he had already taken 20 units this morning and then tomorrow and starting then to do 40 units in the morning daily and to follow the sliding scale that I had instructed and asked  to perform is to perform.  I had him take his evening medications evening medications today and that his main medications today and then I showed him in the pillbox to follow all of those medications each day and take them also I took all of the empty pill bottles in his bag and threw them out  I provided him a sixpack of Glucerna we gave him reading glasses and a bottle that he could now read and then we will check on him again on Monday I think he has enough instructions to be able to make it through the weekend The patient was in the ER for a long period of time and they think he has a urine infection and had given Keflex  pick that up and put it in the pillbox 3 times daily

## 2024-02-07 ENCOUNTER — Telehealth (HOSPITAL_BASED_OUTPATIENT_CLINIC_OR_DEPARTMENT_OTHER): Payer: Self-pay | Admitting: *Deleted

## 2024-02-07 NOTE — Telephone Encounter (Signed)
 Post ED Visit - Positive Culture Follow-up  Culture report reviewed by antimicrobial stewardship pharmacist: [facility] []  Rankin Dee, Pharm.D. []  Venetia Gully, Pharm.D., BCPS AQ-ID []  Garrel Crews, Pharm.D., BCPS []  Almarie Lunger, Pharm.D., BCPS []  [place], [place].D., BCPS, AAHIVP []  Rosaline Bihari, Pharm.D., BCPS, AAHIVP []  Vernell Meier, PharmD, BCPS []  Latanya Hint, PharmD, BCPS []  Donald Medley, PharmD, BCPS []  Rocky Bold, PharmD []  Dorothyann Alert, PharmD, BCPS []  Morene Babe, PharmD  [facility] []  Rosaline Edison, PharmD []  Romona Bliss, PharmD []  Dolphus Roller, PharmD []  Veva Seip, Rph []  Vernell Daunt) Leonce, PharmD []  Eva Allis, PharmD []  Rosaline Millet, PharmD []  Iantha Batch, PharmD []  Arvin Gauss, PharmD []  Wanda Hasting, PharmD []  Ronal Rav, PharmD []  Rocky Slade, PharmD [x]  Camelia Marina, PharmD   Positive urine culture Treated with Cephalexin , organism sensitive to the same and no further patient follow-up is required at this time.  David Wyatt 02/07/2024, 8:35 AM

## 2024-02-09 ENCOUNTER — Telehealth: Payer: Self-pay
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
## 2024-02-12 ENCOUNTER — Encounter: Payer: Self-pay | Admitting: *Deleted

## 2024-02-12 ENCOUNTER — Other Ambulatory Visit (HOSPITAL_COMMUNITY): Payer: Self-pay

## 2024-02-12 ENCOUNTER — Ambulatory Visit: Payer: MEDICAID | Admitting: Critical Care Medicine

## 2024-02-12 NOTE — Congregational Nurse Program (Signed)
  Dept: [phone]   Congregational Nurse Program Note  Date of Encounter: 02/12/2024  Past Medical History: Past Medical History:  Diagnosis Date   Angioedema of lips 07/28/2012   left upper (07/29/2012)   Arthritis    hands and back   Chronic back pain    Cocaine abuse (HCC)    Diabetic neuropathy (HCC)    High cholesterol    Hypertension    Neuropathy    Type II diabetes mellitus [facility])     Encounter Details:  Community Questionnaire - 02/12/24 1431       Questionnaire   Ask client: Do you give verbal consent for me to treat you today? N/A    Student Assistance N/A    Location Patient Served  [facility]    Encounter Setting CN site    Population Status Unhoused    Insurance Medicaid    Insurance/Financial Assistance Referral N/A    Medication Have Medication Insecurities;Provided Medication Assistance    Medical Provider Yes    Screening Referrals Made N/A    Medical Referrals Made N/A    Medical Appointment Completed N/A    CNP Interventions Advocate/Support;Case Management    Screenings CN Performed N/A    ED Visit Averted N/A    Life-Saving Intervention Made N/A         Filled up pill box per MD request. Client could not be located at [facility].  Prabhjot Piscitello W RN CN

## 2024-02-16 ENCOUNTER — Encounter: Payer: Self-pay | Admitting: *Deleted

## 2024-02-16 LAB — GLUCOSE, POCT (MANUAL RESULT ENTRY): POC Glucose: 265 mg/dL — AB (ref 70–99)
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
## 2024-02-16 NOTE — Congregational Nurse Program (Signed)
  Dept: [phone]   Congregational Nurse Program Note  Date of Encounter: 02/16/2024  Past Medical History: Past Medical History:  Diagnosis Date   Angioedema of lips 07/28/2012   left upper (07/29/2012)   Arthritis    hands and back   Chronic back pain    Cocaine abuse (HCC)    Diabetic neuropathy (HCC)    High cholesterol    Hypertension    Neuropathy    Type II diabetes mellitus (HCC)     Encounter Details:  Community Questionnaire - 02/16/24 1000       Questionnaire   Ask client: Do you give verbal consent for me to treat you today? Yes    Student Assistance N/A    Location Patient Served  [facility]    Encounter Setting CN site    Population Status Unhoused    Insurance Medicaid    Insurance/Financial Assistance Referral N/A    Medication Have Medication Insecurities;Patient Medications Reviewed;Provided Medication Assistance    Medical Provider Yes    Screening Referrals Made N/A    Medical Referrals Made N/A    Medical Appointment Completed N/A    CNP Interventions Advocate/Support;Case Management;Educate    Screenings CN Performed Blood Pressure;Blood Glucose    ED Visit Averted N/A    Life-Saving Intervention Made N/A        Client seen in nurse's office to assist with medication pill box. Pill box had missed doses. Client reports he took medication this morning however Monday's time was full and Tuesday's was missing. Reviewed medication with client and explained importance of not missing doses and taking as box set up. While in office client reported his fingers had neuropathy and were tingling. He said he thought about going to ED. Explained that gabapentin  and cymbalta  should help and he needed to not miss doses and take as directed. Asked client if he checked his blood sugar. He responded that he did everyday. Writer asked what it was this morning and he responded that he had not today. Checked vitals and cbg. Blood pressure 117/72, pulse 79, SpO2 94%. CBG 265.  Client requested glucerna. Gave as requested. Offered support and encouragement. Teigen Bellin W RN CN

## 2024-02-17 ENCOUNTER — Telehealth: Payer: Self-pay

## 2024-02-17 ENCOUNTER — Encounter: Payer: Self-pay | Admitting: Critical Care Medicine

## 2024-02-17 ENCOUNTER — Ambulatory Visit: Payer: MEDICAID | Attending: Critical Care Medicine | Admitting: Critical Care Medicine

## 2024-02-17 ENCOUNTER — Other Ambulatory Visit: Payer: Self-pay

## 2024-02-17 VITALS — BP 180/69 | HR 94 | Ht 68.0 in | Wt 164.0 lb

## 2024-02-17 DIAGNOSIS — I10 Essential (primary) hypertension: Secondary | ICD-10-CM

## 2024-02-17 DIAGNOSIS — Z5901 Sheltered homelessness: Secondary | ICD-10-CM

## 2024-02-17 DIAGNOSIS — R413 Other amnesia: Secondary | ICD-10-CM | POA: Insufficient documentation

## 2024-02-17 DIAGNOSIS — Z0283 Encounter for blood-alcohol and blood-drug test: Secondary | ICD-10-CM

## 2024-02-17 DIAGNOSIS — F1490 Cocaine use, unspecified, uncomplicated: Secondary | ICD-10-CM

## 2024-02-17 DIAGNOSIS — E1165 Type 2 diabetes mellitus with hyperglycemia: Secondary | ICD-10-CM

## 2024-02-17 DIAGNOSIS — E782 Mixed hyperlipidemia: Secondary | ICD-10-CM

## 2024-02-17 DIAGNOSIS — N2889 Other specified disorders of kidney and ureter: Secondary | ICD-10-CM

## 2024-02-17 DIAGNOSIS — R45851 Suicidal ideations: Secondary | ICD-10-CM

## 2024-02-17 DIAGNOSIS — E1142 Type 2 diabetes mellitus with diabetic polyneuropathy: Secondary | ICD-10-CM

## 2024-02-17 DIAGNOSIS — E119 Type 2 diabetes mellitus without complications: Secondary | ICD-10-CM

## 2024-02-17 DIAGNOSIS — Z794 Long term (current) use of insulin: Secondary | ICD-10-CM

## 2024-02-17 DIAGNOSIS — F1994 Other psychoactive substance use, unspecified with psychoactive substance-induced mood disorder: Secondary | ICD-10-CM

## 2024-02-17 DIAGNOSIS — B351 Tinea unguium: Secondary | ICD-10-CM | POA: Insufficient documentation

## 2024-02-17 LAB — GLUCOSE, POCT (MANUAL RESULT ENTRY)
POC Glucose: 60 mg/dL — AB (ref 70–99)
POC Glucose: 90 mg/dL (ref 70–99)

## 2024-02-17 MED ORDER — CARVEDILOL 3.125 MG PO TABS
3.1250 mg | ORAL_TABLET | Freq: Two times a day (BID) | ORAL | 0 refills | Status: DC
  Filled 2024-02-17: qty 60, 30d supply, fill #0

## 2024-02-17 MED ORDER — DULOXETINE HCL 60 MG PO CPEP
60.0000 mg | ORAL_CAPSULE | Freq: Every evening | ORAL | 2 refills | Status: DC
  Filled 2024-02-17: qty 60, 60d supply, fill #0

## 2024-02-17 MED ORDER — INSULIN PEN NEEDLE 32G X 4 MM MISC
1.0000 | Freq: Three times a day (TID) | 0 refills | Status: AC
  Filled 2024-02-17: qty 100, 25d supply, fill #0

## 2024-02-17 MED ORDER — DULOXETINE HCL 30 MG PO CPEP
30.0000 mg | ORAL_CAPSULE | Freq: Every day | ORAL | 1 refills | Status: AC
  Filled 2024-02-17: qty 60, 60d supply, fill #0

## 2024-02-17 MED ORDER — METFORMIN HCL 1000 MG PO TABS
1000.0000 mg | ORAL_TABLET | Freq: Two times a day (BID) | ORAL | 1 refills | Status: AC
  Filled 2024-02-17: qty 180, 90d supply, fill #0

## 2024-02-17 MED ORDER — INSULIN GLARGINE 100 UNIT/ML SOLOSTAR PEN: 20.0000 [IU] | PEN_INJECTOR | Freq: Every day | SUBCUTANEOUS | Status: AC

## 2024-02-17 MED ORDER — GABAPENTIN 300 MG PO CAPS
600.0000 mg | ORAL_CAPSULE | Freq: Three times a day (TID) | ORAL | 2 refills | Status: DC
  Filled 2024-02-17: qty 270, 45d supply, fill #0

## 2024-02-17 NOTE — Assessment & Plan Note (Signed)
 Uncontrolled diabetes now hyperglycemic will continue with metformin  at 1000 mg twice daily reduce Lantus  to 20 units at daily and use the sliding scale NovoLog  as prescribed no other medication changes except will increase gabapentin  to 600 mg 3 times daily for neuropathy

## 2024-02-17 NOTE — Assessment & Plan Note (Signed)
 Now had now has sheltered homelessness

## 2024-02-17 NOTE — Assessment & Plan Note (Signed)
Not currently suicidal °

## 2024-02-17 NOTE — Assessment & Plan Note (Addendum)
 Continue continue with fluoxetine continue with fluoxetine and increase do of gabapentin  to 600 600 mg 3 times daily
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
## 2024-02-17 NOTE — Assessment & Plan Note (Signed)
 Essential hypertension not well-controlled will refill all blood pressure medications and monitor he is very dry today given fluids check labs

## 2024-02-17 NOTE — Assessment & Plan Note (Signed)
 Plan to check serum drugs  screen
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
## 2024-02-17 NOTE — Assessment & Plan Note (Signed)
 Will need to need to monitor

## 2024-02-17 NOTE — Assessment & Plan Note (Signed)
 Referral to neurology

## 2024-02-17 NOTE — Telephone Encounter (Signed)
 FL2 emailed to [employer] [facility]

## 2024-02-18 ENCOUNTER — Other Ambulatory Visit: Payer: Self-pay | Admitting: Vascular Surgery

## 2024-02-18 DIAGNOSIS — I739 Peripheral vascular disease, unspecified: Secondary | ICD-10-CM

## 2024-02-18 LAB — CBC WITH DIFFERENTIAL/PLATELET
Basophils Absolute: 0 x10E3/uL (ref 0.0–0.2)
Basos: 1 %
EOS (ABSOLUTE): 0.3 x10E3/uL (ref 0.0–0.4)
Eos: 4 %
Hematocrit: 42 % (ref 37.5–51.0)
Hemoglobin: 13.5 g/dL (ref 13.0–17.7)
Immature Grans (Abs): 0 x10E3/uL (ref 0.0–0.1)
Immature Granulocytes: 0 %
Lymphocytes Absolute: 2.1 x10E3/uL (ref 0.7–3.1)
Lymphs: 31 %
MCH: 30.5 pg (ref 26.6–33.0)
MCHC: 32.1 g/dL (ref 31.5–35.7)
MCV: 95 fL (ref 79–97)
Monocytes Absolute: 0.5 x10E3/uL (ref 0.1–0.9)
Monocytes: 8 %
Neutrophils Absolute: 3.8 x10E3/uL (ref 1.4–7.0)
Neutrophils: 55 %
Platelets: 551 x10E3/uL — ABNORMAL HIGH (ref 150–450)
RBC: 4.43 x10E6/uL (ref 4.14–5.80)
RDW: 12.7 % (ref 11.6–15.4)
WBC: 6.8 x10E3/uL (ref 3.4–10.8)

## 2024-02-18 LAB — LIPID PANEL
Chol/HDL Ratio: 2.5 ratio (ref 0.0–5.0)
Cholesterol, Total: 150 mg/dL (ref 100–199)
HDL: 61 mg/dL (ref >39)
LDL Chol Calc (NIH): 66 mg/dL (ref 0–99)
Triglycerides: 134 mg/dL (ref 0–149)
VLDL Cholesterol Cal: 23 mg/dL (ref 5–40)

## 2024-02-18 LAB — CMP14+EGFR
ALT: 18 IU/L (ref 0–44)
AST: 17 IU/L (ref 0–40)
Albumin: 4.3 g/dL (ref 3.9–4.9)
Alkaline Phosphatase: 104 IU/L (ref 47–123)
BUN/Creatinine Ratio: 22 (ref 10–24)
BUN: 27 mg/dL (ref 8–27)
Bilirubin Total: 0.3 mg/dL (ref 0.0–1.2)
CO2: 19 mmol/L — ABNORMAL LOW (ref 20–29)
Calcium: 10 mg/dL (ref 8.6–10.2)
Chloride: 106 mmol/L (ref 96–106)
Creatinine, Ser: 1.22 mg/dL (ref 0.76–1.27)
Globulin, Total: 3.3 g/dL (ref 1.5–4.5)
Glucose: 75 mg/dL (ref 70–99)
Potassium: 5.1 mmol/L (ref 3.5–5.2)
Sodium: 141 mmol/L (ref 134–144)
Total Protein: 7.6 g/dL (ref 6.0–8.5)
eGFR: 66 mL/min/1.73 (ref >59)
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
## 2024-02-18 NOTE — Telephone Encounter (Signed)
 This is very sad and I understand If they dc him 12/24 christmas eve I will request we use [employer] fund to pay for a motel for one month unless he accepts ALF. Rhonda can hou sleak to him today re this or jan and let Lolita know Adding [name] welch.  [name] pls be prepared to use [employer]. Compliance has stated this is a safe harbor  if the pt is homeless

## 2024-02-19 ENCOUNTER — Encounter: Payer: Self-pay | Admitting: Emergency Medicine

## 2024-02-19 ENCOUNTER — Other Ambulatory Visit: Payer: Self-pay

## 2024-02-19 ENCOUNTER — Encounter: Payer: Self-pay | Admitting: *Deleted

## 2024-02-19 LAB — MICROALBUMIN / CREATININE URINE RATIO
Creatinine, Urine: 114.2 mg/dL
Microalb/Creat Ratio: 16 mg/g{creat} (ref 0–29)
Microalbumin, Urine: 17.8 ug/mL

## 2024-02-19 NOTE — Progress Notes (Unsigned)
 Patient returned to the clinic as he wanted to call 911 for shoulder pain after leaving the clinic.  Patient reassessed and he has point tenderness to both shoulders, no deformities or erythema/swelling and he can range both shoulders.  Distal pulses intact.  He denies CP/SOB  CBG 154 Pt given APAP 1gm and given bottle and instructed to take 1 tablet (500mg  APAP) twice a day.  Suspect underlying arthritis exacerbated by leaning over using walker.

## 2024-02-19 NOTE — Progress Notes (Unsigned)
 Patient presents reporting neuropathy pain in his extremities, worse in right hand.  No acute issues at this time.  He was just seen by Dr. Brien who increased his gabapentin  to 600mg  TID but meds have not arrived as of yet.  PE: Pt awake/alert, no distress Antalgic gait noted Upper extremities are well perfused,2+ pulses, no deformities, but arthritic changes noted.  Meds will be picked up today.  Patient has been offered ALF but he is reluctant- he was encouraged to re-consider this offer.

## 2024-02-19 NOTE — Progress Notes (Signed)
 Charted in Error

## 2024-02-19 NOTE — Congregational Nurse Program (Signed)
°  Dept: [phone]   Congregational Nurse Program Note  Date of Encounter: 02/19/2024  Past Medical History: Past Medical History:  Diagnosis Date   Angioedema of lips 07/28/2012   left upper (07/29/2012)   Arthritis    hands and back   Auditory hallucination 01/23/2024   Chronic back pain    Cocaine abuse (HCC)    Diabetic neuropathy (HCC)    High cholesterol    Hypertension    Neuropathy    Type II diabetes mellitus [facility])     Encounter Details:  Community Questionnaire - 02/19/24 1148       Questionnaire   Ask client: Do you give verbal consent for me to treat you today? Yes    Student Assistance N/A    Location Patient Served  [facility]    Encounter Setting CN site;Phone/Text/Email    Population Status Unhoused    Insurance Medicaid    Insurance/Financial Assistance Referral N/A    Medication Have Medication Insecurities;Provided Medication Assistance;Patient Medications Reviewed    Medical Provider Yes    Screening Referrals Made N/A    Medical Referrals Made N/A    Medical Appointment Completed N/A    CNP Interventions Advocate/Support;Case Management    Screenings CN Performed N/A    ED Visit Averted N/A    Life-Saving Intervention Made N/A         Client seen by MD in [facility] clinic. Assisted as needed. Client reports he did not pick up his medication at pharmacy yesterday. Client requested writer pick up. MD requested writer make appt for his referrals. Called [phone] Neurology appt Feb 11th 7:45 Dr Dina [address] [place], [place] [phone]) [facility] and Dr Jama is out until Dec 16th, called [phone]) [facility] [address] [facility] and made an appt with Dr Magdalen Dec 22 1:30. All information written for client. Writer went to [facility] and picked up two medications that were ready Cymbalta  60mg  and Cymbalta  30mg . Placed in client's medication bag. Filled pill box with medication changes made from recent PCP  encounter. Juliah Scadden W RN CN

## 2024-02-20 ENCOUNTER — Ambulatory Visit: Payer: Self-pay | Admitting: Critical Care Medicine

## 2024-02-20 LAB — DRUG SCREEN 764883 11+OXYCO+ALC+CRT-BUND
Amphetamines, Urine: NEGATIVE ng/mL
BENZODIAZ UR QL: NEGATIVE ng/mL
Barbiturate screen, urine: NEGATIVE ng/mL
Cannabinoid Quant, Ur: NEGATIVE ng/mL
Creatinine, Urine: 114.5 mg/dL (ref 20.0–300.0)
Ethanol, Urine: NEGATIVE %
Meperidine: NEGATIVE ng/mL
Methadone Screen, Urine: NEGATIVE ng/mL
Nitrite Urine, Quantitative: NEGATIVE ug/mL
OPIATE SCREEN URINE: NEGATIVE ng/mL
Oxycodone/Oxymorphone, Urine: NEGATIVE ng/mL
PCP Quant, Ur: NEGATIVE ng/mL
Propoxyphene: NEGATIVE ng/mL
Tramadol: NEGATIVE ng/mL
pH, Urine: 5 (ref 4.5–8.9)

## 2024-02-20 LAB — COCAINE CONF, UR
Benzoylecgonine GC/MS Conf: 11400 ng/mL
Cocaine Metab Quant, Ur: POSITIVE — AB

## 2024-02-20 NOTE — Progress Notes (Signed)
 Cocaine positive in urine all others neg

## 2024-02-23 LAB — GLUCOSE, POCT (MANUAL RESULT ENTRY): POC Glucose: 70 mg/dL (ref 70–99)

## 2024-02-23 NOTE — Congregational Nurse Program (Unsigned)
°  Dept: [phone]   Congregational Nurse Program Note  Date of Encounter: 02/23/2024  Clinic visit to check blood pressure and blood glucose.  Complaining of some pain in feet, to go to MD clinic on 02/25/2024.  Blood glucose 70, given bottle of Glucerna to drink before lunch.  Past Medical History: Past Medical History:  Diagnosis Date   Angioedema of lips 07/28/2012   left upper (07/29/2012)   Arthritis    hands and back   Auditory hallucination 01/23/2024   Chronic back pain    Cocaine abuse (HCC)    Diabetic neuropathy (HCC)    High cholesterol    Hypertension    Neuropathy    Type II diabetes mellitus [facility])     Encounter Details:  Community Questionnaire - 02/23/24 0955       Questionnaire   Ask client: Do you give verbal consent for me to treat you today? Yes    Student Assistance N/A    Location Patient Served  [facility]    Encounter Setting CN site    Population Status Unhoused    Insurance Medicaid    Insurance/Financial Assistance Referral N/A    Medication Have Medication Insecurities;Patient Medications Reviewed    Medical Provider Yes    Screening Referrals Made N/A    Medical Referrals Made N/A    Medical Appointment Completed N/A    CNP Interventions Advocate/Support;Case Management;Counsel    Screenings CN Performed Blood Pressure;Blood Glucose    ED Visit Averted N/A    Life-Saving Intervention Made N/A

## 2024-02-24 ENCOUNTER — Encounter: Payer: Self-pay | Admitting: *Deleted

## 2024-02-24 NOTE — Telephone Encounter (Signed)
 Per Lolita Birmingham, [facility], they continue to work with the patient regarding placement in ALF

## 2024-02-24 NOTE — Congregational Nurse Program (Signed)
°  Dept: [phone]   Congregational Nurse Program Note  Date of Encounter: 02/24/2024  Past Medical History: Past Medical History:  Diagnosis Date   Angioedema of lips 07/28/2012   left upper (07/29/2012)   Arthritis    hands and back   Auditory hallucination 01/23/2024   Chronic back pain    Cocaine abuse (HCC)    Diabetic neuropathy (HCC)    High cholesterol    Hypertension    Neuropathy    Type II diabetes mellitus [facility])     Encounter Details:  Community Questionnaire - 02/24/24 1151       Questionnaire   Ask client: Do you give verbal consent for me to treat you today? N/A    Student Assistance N/A    Location Patient Served  [facility]    Encounter Setting Phone/Text/Email    Population Status Unhoused    Insurance Medicaid    Insurance/Financial Assistance Referral N/A    Medication N/A    Medical Provider Yes    Screening Referrals Made N/A    Medical Referrals Made N/A    Medical Appointment Completed N/A    CNP Interventions Advocate/Support;Navigate Healthcare System;Case Management    Screenings CN Performed N/A    ED Visit Averted N/A    Life-Saving Intervention Made N/A         Assisted client by making an eye appt with [facility] [address] [place] with Dr Jama Dec 22 11:45 per MD request. Information about appointment left in envelope at [facility] front desk with clients name and bed number. Madalee Altmann W RN CN   d

## 2024-02-25 ENCOUNTER — Encounter: Payer: Self-pay | Admitting: Physician Assistant

## 2024-02-25 NOTE — Progress Notes (Signed)
 Requested to sign his FL-2, Social Worker Cat is trying to get him into assisted living.   Confirmed with Dr Brien that it is ok for me to sign this >> did so.    Shona Shad, PA-C 02/25/2024 3:19 PM

## 2024-02-26 ENCOUNTER — Encounter: Payer: Self-pay | Admitting: *Deleted

## 2024-02-26 NOTE — Congregational Nurse Program (Signed)
°  Dept: [phone]   Congregational Nurse Program Note  Date of Encounter: 02/26/2024  Past Medical History: Past Medical History:  Diagnosis Date   Angioedema of lips 07/28/2012   left upper (07/29/2012)   Arthritis    hands and back   Auditory hallucination 01/23/2024   Chronic back pain    Cocaine abuse (HCC)    Diabetic neuropathy (HCC)    High cholesterol    Hypertension    Neuropathy    Type II diabetes mellitus [facility])     Encounter Details:  Community Questionnaire - 02/26/24 1036       Questionnaire   Ask client: Do you give verbal consent for me to treat you today? N/A    Student Assistance N/A    Location Patient Served  [facility]    Encounter Setting CN site    Population Status Unhoused    Insurance Medicaid    Insurance/Financial Assistance Referral N/A    Medication Have Medication Insecurities;Provided Medication Assistance    Medical Provider Yes    Screening Referrals Made N/A    Medical Referrals Made N/A    Medical Appointment Completed N/A    CNP Interventions Advocate/Support;Case Management    Screenings CN Performed N/A    ED Visit Averted N/A    Life-Saving Intervention Made N/A         Tried to locate client at [facility] shelter and could not find him. Filled weekly pill box as requested by MD.Left written message with pill box about upcoming eye appt Dec 22. Sultana Tierney W RN CN

## 2024-03-01 ENCOUNTER — Ambulatory Visit: Payer: MEDICAID | Admitting: Podiatry

## 2024-03-01 ENCOUNTER — Encounter: Payer: Self-pay | Admitting: *Deleted

## 2024-03-01 NOTE — Congregational Nurse Program (Signed)
" °  Dept: [phone]   Congregational Nurse Program Note  Date of Encounter: 03/01/2024  Past Medical History: Past Medical History:  Diagnosis Date   Angioedema of lips 07/28/2012   left upper (07/29/2012)   Arthritis    hands and back   Auditory hallucination 01/23/2024   Chronic back pain    Cocaine  abuse (HCC)    Diabetic neuropathy (HCC)    High cholesterol    Hypertension    Neuropathy    Type II diabetes mellitus [facility])     Encounter Details:  Community Questionnaire - 03/01/24 1232       Questionnaire   Ask client: Do you give verbal consent for me to treat you today? N/A    Student Assistance N/A    Location Patient Served  [facility]    Encounter Setting CN site    Population Status Unhoused    Insurance Medicaid    Insurance/Financial Assistance Referral N/A    Medication Have Medication Insecurities;Provided Medication Assistance    Medical Provider Yes    Screening Referrals Made N/A    Medical Referrals Made N/A    Medical Appointment Completed N/A    CNP Interventions Advocate/Support;Case Management    Screenings CN Performed N/A    ED Visit Averted N/A    Life-Saving Intervention Made N/A            Dept: [phone]   Congregational Nurse Program Note  Date of Encounter: 03/01/2024  Past Medical History: Past Medical History:  Diagnosis Date   Angioedema of lips 07/28/2012   left upper (07/29/2012)   Arthritis    hands and back   Auditory hallucination 01/23/2024   Chronic back pain    Cocaine  abuse (HCC)    Diabetic neuropathy (HCC)    High cholesterol    Hypertension    Neuropathy    Type II diabetes mellitus [facility])     Encounter Details:  Community Questionnaire - 03/01/24 1232       Questionnaire   Ask client: Do you give verbal consent for me to treat you today? N/A    Student Assistance N/A    Location Patient Served  [facility]    Encounter Setting CN site    Population Status Unhoused    Insurance Medicaid     Insurance/Financial Assistance Referral N/A    Medication Have Medication Insecurities;Provided Medication Assistance    Medical Provider Yes    Screening Referrals Made N/A    Medical Referrals Made N/A    Medical Appointment Completed N/A    CNP Interventions Advocate/Support;Case Management    Screenings CN Performed N/A    ED Visit Averted N/A    Life-Saving Intervention Made N/A         Filled up weekly pill box per MD request. Tried to locate client to remind him again of eye doctor's appt today. Could not locate client.  Adiel Mcnamara W RN CN   "
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
## 2024-03-04 ENCOUNTER — Other Ambulatory Visit: Payer: Self-pay

## 2024-03-04 ENCOUNTER — Emergency Department (HOSPITAL_COMMUNITY)
Admission: EM | Admit: 2024-03-04 | Discharge: 2024-03-04 | Disposition: A | Discharge status: Home / Self Care | Payer: MEDICAID

## 2024-03-04 DIAGNOSIS — D72829 Elevated white blood cell count, unspecified: Secondary | ICD-10-CM | POA: Insufficient documentation

## 2024-03-04 DIAGNOSIS — Z794 Long term (current) use of insulin: Secondary | ICD-10-CM | POA: Insufficient documentation

## 2024-03-04 DIAGNOSIS — E119 Type 2 diabetes mellitus without complications: Secondary | ICD-10-CM | POA: Insufficient documentation

## 2024-03-04 DIAGNOSIS — R197 Diarrhea, unspecified: Secondary | ICD-10-CM | POA: Insufficient documentation

## 2024-03-04 DIAGNOSIS — R112 Nausea with vomiting, unspecified: Secondary | ICD-10-CM | POA: Diagnosis present

## 2024-03-04 LAB — COMPREHENSIVE METABOLIC PANEL WITH GFR
ALT: 30 U/L (ref 0–44)
AST: 25 U/L (ref 15–41)
Albumin: 3.9 g/dL (ref 3.5–5.0)
Alkaline Phosphatase: 104 U/L (ref 38–126)
Anion gap: 12 (ref 5–15)
BUN: 25 mg/dL — ABNORMAL HIGH (ref 8–23)
CO2: 21 mmol/L — ABNORMAL LOW (ref 22–32)
Calcium: 9.1 mg/dL (ref 8.9–10.3)
Chloride: 102 mmol/L (ref 98–111)
Creatinine, Ser: 1.23 mg/dL (ref 0.61–1.24)
GFR, Estimated: >60 mL/min
Glucose, Bld: 160 mg/dL — ABNORMAL HIGH (ref 70–99)
Potassium: 4.7 mmol/L (ref 3.5–5.1)
Sodium: 135 mmol/L (ref 135–145)
Total Bilirubin: 1 mg/dL (ref 0.0–1.2)
Total Protein: 7.7 g/dL (ref 6.5–8.1)

## 2024-03-04 LAB — CBC
HCT: 41.9 % (ref 39.0–52.0)
Hemoglobin: 13.5 g/dL (ref 13.0–17.0)
MCH: 30.8 pg (ref 26.0–34.0)
MCHC: 32.2 g/dL (ref 30.0–36.0)
MCV: 95.7 fL (ref 80.0–100.0)
Platelets: 311 K/uL (ref 150–400)
RBC: 4.38 MIL/uL (ref 4.22–5.81)
RDW: 13.8 % (ref 11.5–15.5)
WBC: 13.2 K/uL — ABNORMAL HIGH (ref 4.0–10.5)
nRBC: 0 % (ref 0.0–0.2)

## 2024-03-04 LAB — LIPASE, BLOOD: Lipase: 14 U/L (ref 11–51)

## 2024-03-04 MED ORDER — LACTATED RINGERS IV BOLUS
500.0000 mL | Freq: Once | INTRAVENOUS | Status: AC
  Administered 2024-03-04: 500 mL via INTRAVENOUS

## 2024-03-04 MED ORDER — ONDANSETRON 4 MG PO TBDP
4.0000 mg | ORAL_TABLET | Freq: Three times a day (TID) | ORAL | 0 refills | Status: DC | PRN
  Filled 2024-03-04: qty 20, 7d supply, fill #0

## 2024-03-04 MED ORDER — ONDANSETRON HCL 4 MG/2ML IJ SOLN
4.0000 mg | Freq: Once | INTRAMUSCULAR | Status: AC
  Administered 2024-03-04: 4 mg via INTRAVENOUS
  Filled 2024-03-04: qty 2

## 2024-03-04 MED ORDER — ONDANSETRON 4 MG PO TBDP
4.0000 mg | ORAL_TABLET | Freq: Three times a day (TID) | ORAL | 0 refills | Status: AC | PRN
  Filled 2024-03-04: qty 20, 7d supply, fill #0

## 2024-03-04 NOTE — ED Provider Notes (Signed)
 " [place] EMERGENC[facility] DEPARTMENT AT [facility] Provider Note   CSN: [id number] Arrival date & time: 03/04/24  1007     Patient presents with: Diarrhea and Emesis   David Wyatt is a 63 y.o. male.  {Add pertinent medical, surgical, social history, OB history to HPI:32947}  Diarrhea Associated symptoms: vomiting   Emesis Associated symptoms: diarrhea    Patient is a 63 year old male with past medical history significant for cocaine  use, diabetes, neuropathy, chronic pain  Patient presents emergency room today with complaints of nausea vomiting diarrhea for the past 3 days.  No fevers at home denies any abdominal pain no chest pain difficulty breathing.  No lightheadedness or dizziness.  He states he has not been eating and drinking as much because of his nausea.      Prior to Admission medications  Medication Sig Start Date End Date Taking? Authorizing Provider  Accu-Chek Softclix Lancets lancets Use as directed. 02/03/24   Barrett, Shona MATSU, PA-C  amLODipine  (NORVASC ) 10 MG tablet Take 1 tablet (10 mg total) by mouth daily. 02/05/24   Brien Belvie BRAVO, MD  atorvastatin  (LIPITOR) 40 MG tablet Take 1 tablet (40 mg total) by mouth daily. 02/03/24   Barrett, Shona MATSU, PA-C  Blood Glucose Monitoring Suppl (BLOOD GLUCOSE MONITOR SYSTEM) w/Device KIT Use in the morning, at noon, and at bedtime. 02/02/24   Dennise Lavada POUR, MD  carvedilol  (COREG ) 3.125 MG tablet Take 1 tablet (3.125 mg total) by mouth 2 (two) times daily with a meal. 02/17/24   Brien Belvie BRAVO, MD  cephALEXin  (KEFLEX ) 500 MG capsule Take 1 capsule (500 mg total) by mouth 4 (four) times daily. 02/04/24   Armenta Canning, MD  DULoxetine  (CYMBALTA ) 30 MG capsule Take 1 capsule (30 mg total) by mouth daily with breakfast. 02/17/24   Brien Belvie BRAVO, MD  DULoxetine  (CYMBALTA ) 60 MG capsule Take 1 capsule (60 mg total) by mouth every evening. 02/17/24   Brien Belvie BRAVO, MD  folic acid  (FOLVITE ) 1 MG tablet Take  1 tablet (1 mg total) by mouth daily. 01/25/24   Amin, Sumayya, MD  gabapentin  (NEURONTIN ) 300 MG capsule Take 2 capsules (600 mg total) by mouth 3 (three) times daily. 02/17/24   Brien Belvie BRAVO, MD  glucose blood (ACCU-CHEK GUIDE TEST) test strip Use as instructed to check blood sugar three times daily 02/03/24   Barrett, Rhonda G, PA-C  insulin  aspart (NOVOLOG ) 100 UNIT/ML FlexPen Before each meal 3 times a day, If less than 150 do not give, 151-199 - 2 units, 200-250 - 6 units, 251-299 - 8 units,  300-349 - 10 units,  350 or above 12 units. 02/03/24   Barrett, Shona MATSU, PA-C  insulin  glargine (LANTUS ) 100 UNIT/ML Solostar Pen Inject 20 Units into the skin at bedtime. 02/17/24   Brien Belvie BRAVO, MD  Insulin  Pen Needle 32G X 4 MM MISC Use 1 each to inject insulin  into the skin 4 (four) times daily -  before meals and at bedtime. 02/17/24   Brien Belvie BRAVO, MD  isosorbide  mononitrate (IMDUR ) 30 MG 24 hr tablet Take 1 tablet (30 mg total) by mouth daily. 02/03/24   Barrett, Shona MATSU, PA-C  metFORMIN  (GLUCOPHAGE ) 1000 MG tablet Take 1 tablet (1,000 mg total) by mouth 2 (two) times daily with a meal. 02/17/24   Brien Belvie BRAVO, MD  Multiple Vitamin (MULTIVITAMIN WITH MINERALS) TABS tablet Take 1 tablet by mouth daily. 01/25/24   Amin, Sumayya, MD  thiamine  (VITAMIN B1) 100  MG tablet Take 1 tablet (100 mg total) by mouth daily. 01/25/24   Amin, Sumayya, MD  traZODone  (DESYREL ) 50 MG tablet Take 1 tablet (50 mg total) by mouth at bedtime as needed for sleep. 02/05/24   Brien Belvie BRAVO, MD    Allergies: Lisinopril     Review of Systems  Gastrointestinal:  Positive for diarrhea and vomiting.    Updated Vital Signs BP 112/80 (BP Location: Right Arm)   Pulse (!) 104   Temp 98.6 F (37 C) (Oral)   Resp 20   Ht 5' 8 (1.727 m)   Wt 68 kg   SpO2 99%   BMI 22.81 kg/m   Physical Exam Vitals and nursing note reviewed.  Constitutional:      General: He is not in acute distress.    Comments:  Pleasant well-appearing 63 year old.  In no acute distress.  Sitting comfortably in bed.  Able answer questions appropriately follow commands. No increased work of breathing. Speaking in full sentences.   HENT:     Head: Normocephalic and atraumatic.     Nose: Nose normal.     Mouth/Throat:     Mouth: Mucous membranes are dry.  Eyes:     General: No scleral icterus. Cardiovascular:     Rate and Rhythm: Regular rhythm. Tachycardia present.     Pulses: Normal pulses.     Heart sounds: Normal heart sounds.  Pulmonary:     Effort: Pulmonary effort is normal. No respiratory distress.     Breath sounds: No wheezing.  Abdominal:     Palpations: Abdomen is soft.     Tenderness: There is no abdominal tenderness. There is no guarding or rebound.  Musculoskeletal:     Cervical back: Normal range of motion.     Right lower leg: No edema.     Left lower leg: No edema.  Skin:    General: Skin is warm and dry.     Capillary Refill: Capillary refill takes less than 2 seconds.  Neurological:     Mental Status: He is alert. Mental status is at baseline.  Psychiatric:        Mood and Affect: Mood normal.        Behavior: Behavior normal.     (all labs ordered are listed, but only abnormal results are displayed) Labs Reviewed  COMPREHENSIVE METABOLIC PANEL WITH GFR - Abnormal; Notable for the following components:      Result Value   CO2 21 (*)    Glucose, Bld 160 (*)    BUN 25 (*)    All other components within normal limits  CBC - Abnormal; Notable for the following components:   WBC 13.2 (*)    All other components within normal limits  LIPASE, BLOOD  URINALYSIS, ROUTINE W REFLEX MICROSCOPIC    EKG: None  Radiology: No results found.  {Document cardiac monitor, telemetry assessment procedure when appropriate:32947} Procedures   Medications Ordered in the ED  lactated ringers  bolus 500 mL (has no administration in time range)  ondansetron  (ZOFRAN ) injection 4 mg (has no  administration in time range)      {Click here for ABCD2, HEART and other calculators REFRESH Note before signing:1}                              Medical Decision Making Amount and/or Complexity of Data Reviewed Labs: ordered.  Risk Prescription drug management.   This patient presents to the ED  for concern of nausea/vomiting/diarrhea, this involves a number of treatment options, and is a complaint that carries with it a moderate risk of complications and morbidity. A differential diagnosis was considered for the patient's symptoms which is discussed below:   The differential diagnosis of diarrhea includes but is not limited to Viral- norovirus/rotavirus; Bacterial-Campylobacter,Shigella, Salmonella, Escherichia coli, E. coli 0157:H7, Yersinia enterocolitica, Vibrio cholerae, Clostridium difficile. Parasitic- Giardia lamblia, Cryptosporidium,Entamoeba histolytica,Cyclospora, Microsporidium. Toxin- Staphylococcus aureus, Bacillus cereus. Noninfectious causes include GI Bleed, Appendicitis, Mesenteric Ischemia, Diverticulitis, Adrenal Crisis, Thyroid  Storm, Toxicologic exposures, Antibiotic or drug-associated, inflammatory bowel disease.    Co morbidities: Discussed in HPI   Brief History:  Patient is a 63 year old male with past medical history significant for cocaine  use, diabetes, neuropathy, chronic pain  Patient presents emergency room today with complaints of nausea vomiting diarrhea for the past 3 days.  No fevers at home denies any abdominal pain no chest pain difficulty breathing.  No lightheadedness or dizziness.  He states he has not been eating and drinking as much because of his nausea.    EMR reviewed including pt PMHx, past surgical history and past visits to ER.   See HPI for more details   Lab Tests:   I ordered and independently interpreted labs. Labs notable for Nonspecific leukocytosis present in the setting of vomiting.  CMP unremarkable lipase normal  doubt pancreatitis  Imaging Studies:  No imaging studies ordered for this patient    Cardiac Monitoring:  NA NA   Medicines ordered:  I ordered medication including Zofran , LR 500 mL for hydration and for nausea Reevaluation of the patient after these medicines showed that the patient improved I have reviewed the patients home medicines and have made adjustments as needed   Critical Interventions:     Consults/Attending Physician      Reevaluation:  After the interventions noted above I re-evaluated patient and found that they have :improved   Social Determinants of Health:      Problem List / ED Course:  ***   Dispostion:  After consideration of the diagnostic results and the patients response to treatment, I feel that the patent would benefit from ***    Final diagnoses:  Nausea vomiting and diarrhea    ED Discharge Orders     None        "

## 2024-03-04 NOTE — Discharge Instructions (Signed)
Please follow up with your primary doctor within the next 5-7 days.  If you do not have a primary care provider, information for a healthcare clinic has been provided for you to make arrangements for follow up care. Please return to the ER sooner if you have any new or worsening symptoms, or if you have any of the following symptoms:  Abdominal pain that does not go away.  You have a fever.  You keep throwing up (vomiting).  The pain is felt only in portions of the abdomen. Pain in the right side could possibly be appendicitis. In an adult, pain in the left lower portion of the abdomen could be colitis or diverticulitis.  You pass bloody or black tarry stools.  There is bright red blood in the stool.  The constipation stays for more than 4 days.  There is belly (abdominal) or rectal pain.  You do not seem to be getting better.  You have any questions or concerns. ' 

## 2024-03-04 NOTE — ED Triage Notes (Signed)
 Patient arrives via [facility] for diarrhea and emesis x3 days. Patient CBG 185. 122/90, HR 105, RR 18, 94 on room air, poor PO intake. GCS 15. Alert oriented.

## 2024-03-05 ENCOUNTER — Other Ambulatory Visit (HOSPITAL_COMMUNITY): Payer: Self-pay

## 2024-03-07 ENCOUNTER — Observation Stay (HOSPITAL_COMMUNITY): Admission: EM | Admit: 2024-03-07 | Discharge: 2024-03-08 | Disposition: A | Payer: MEDICAID

## 2024-03-07 ENCOUNTER — Emergency Department (HOSPITAL_COMMUNITY): Payer: MEDICAID

## 2024-03-07 ENCOUNTER — Encounter (HOSPITAL_COMMUNITY): Payer: Self-pay

## 2024-03-07 ENCOUNTER — Other Ambulatory Visit: Payer: Self-pay

## 2024-03-07 DIAGNOSIS — K219 Gastro-esophageal reflux disease without esophagitis: Secondary | ICD-10-CM | POA: Diagnosis not present

## 2024-03-07 DIAGNOSIS — E1140 Type 2 diabetes mellitus with diabetic neuropathy, unspecified: Secondary | ICD-10-CM | POA: Diagnosis not present

## 2024-03-07 DIAGNOSIS — I10 Essential (primary) hypertension: Secondary | ICD-10-CM | POA: Diagnosis present

## 2024-03-07 DIAGNOSIS — E11649 Type 2 diabetes mellitus with hypoglycemia without coma: Secondary | ICD-10-CM | POA: Diagnosis not present

## 2024-03-07 DIAGNOSIS — Z79899 Other long term (current) drug therapy: Secondary | ICD-10-CM | POA: Diagnosis not present

## 2024-03-07 DIAGNOSIS — J101 Influenza due to other identified influenza virus with other respiratory manifestations: Secondary | ICD-10-CM | POA: Diagnosis present

## 2024-03-07 DIAGNOSIS — F332 Major depressive disorder, recurrent severe without psychotic features: Secondary | ICD-10-CM | POA: Diagnosis present

## 2024-03-07 DIAGNOSIS — J9601 Acute respiratory failure with hypoxia: Secondary | ICD-10-CM | POA: Diagnosis not present

## 2024-03-07 DIAGNOSIS — E785 Hyperlipidemia, unspecified: Secondary | ICD-10-CM | POA: Diagnosis not present

## 2024-03-07 DIAGNOSIS — E1165 Type 2 diabetes mellitus with hyperglycemia: Secondary | ICD-10-CM | POA: Diagnosis not present

## 2024-03-07 DIAGNOSIS — Z7984 Long term (current) use of oral hypoglycemic drugs: Secondary | ICD-10-CM | POA: Insufficient documentation

## 2024-03-07 DIAGNOSIS — R112 Nausea with vomiting, unspecified: Secondary | ICD-10-CM | POA: Diagnosis not present

## 2024-03-07 DIAGNOSIS — R197 Diarrhea, unspecified: Secondary | ICD-10-CM | POA: Insufficient documentation

## 2024-03-07 DIAGNOSIS — G8929 Other chronic pain: Secondary | ICD-10-CM | POA: Diagnosis not present

## 2024-03-07 DIAGNOSIS — R0902 Hypoxemia: Principal | ICD-10-CM

## 2024-03-07 DIAGNOSIS — F1491 Cocaine use, unspecified, in remission: Secondary | ICD-10-CM | POA: Diagnosis present

## 2024-03-07 DIAGNOSIS — M549 Dorsalgia, unspecified: Secondary | ICD-10-CM | POA: Diagnosis not present

## 2024-03-07 DIAGNOSIS — E162 Hypoglycemia, unspecified: Secondary | ICD-10-CM | POA: Diagnosis present

## 2024-03-07 DIAGNOSIS — Z794 Long term (current) use of insulin: Secondary | ICD-10-CM | POA: Diagnosis not present

## 2024-03-07 DIAGNOSIS — N3090 Cystitis, unspecified without hematuria: Secondary | ICD-10-CM | POA: Insufficient documentation

## 2024-03-07 DIAGNOSIS — Z87891 Personal history of nicotine dependence: Secondary | ICD-10-CM | POA: Insufficient documentation

## 2024-03-07 LAB — CBG MONITORING, ED
Glucose-Capillary: 209 mg/dL — ABNORMAL HIGH (ref 70–99)
Glucose-Capillary: 229 mg/dL — ABNORMAL HIGH (ref 70–99)
Glucose-Capillary: 232 mg/dL — ABNORMAL HIGH (ref 70–99)
Glucose-Capillary: 268 mg/dL — ABNORMAL HIGH (ref 70–99)
Glucose-Capillary: 299 mg/dL — ABNORMAL HIGH (ref 70–99)
Glucose-Capillary: 347 mg/dL — ABNORMAL HIGH (ref 70–99)
Glucose-Capillary: 376 mg/dL — ABNORMAL HIGH (ref 70–99)
Glucose-Capillary: 50 mg/dL — ABNORMAL LOW (ref 70–99)
Glucose-Capillary: 99 mg/dL (ref 70–99)

## 2024-03-07 LAB — I-STAT VENOUS BLOOD GAS, ED
Acid-base deficit: 4 mmol/L — ABNORMAL HIGH (ref 0.0–2.0)
Bicarbonate: 20.8 mmol/L (ref 20.0–28.0)
Calcium, Ion: 1.15 mmol/L (ref 1.15–1.40)
HCT: 39 % (ref 39.0–52.0)
Hemoglobin: 13.3 g/dL (ref 13.0–17.0)
O2 Saturation: 92 %
Potassium: 4.6 mmol/L (ref 3.5–5.1)
Sodium: 137 mmol/L (ref 135–145)
TCO2: 22 mmol/L (ref 22–32)
pCO2, Ven: 37.7 mmHg — ABNORMAL LOW (ref 44–60)
pH, Ven: 7.349 (ref 7.25–7.43)
pO2, Ven: 66 mmHg — ABNORMAL HIGH (ref 32–45)

## 2024-03-07 LAB — CBC
HCT: 39.3 % (ref 39.0–52.0)
HCT: 37.5 % — ABNORMAL LOW (ref 39.0–52.0)
Hemoglobin: 12.4 g/dL — ABNORMAL LOW (ref 13.0–17.0)
Hemoglobin: 12.2 g/dL — ABNORMAL LOW (ref 13.0–17.0)
MCH: 30.5 pg (ref 26.0–34.0)
MCH: 31.1 pg (ref 26.0–34.0)
MCHC: 31.6 g/dL (ref 30.0–36.0)
MCHC: 32.5 g/dL (ref 30.0–36.0)
MCV: 96.6 fL (ref 80.0–100.0)
MCV: 95.7 fL (ref 80.0–100.0)
Platelets: 309 K/uL (ref 150–400)
Platelets: 290 K/uL (ref 150–400)
RBC: 4.07 MIL/uL — ABNORMAL LOW (ref 4.22–5.81)
RBC: 3.92 MIL/uL — ABNORMAL LOW (ref 4.22–5.81)
RDW: 13.7 % (ref 11.5–15.5)
RDW: 13.8 % (ref 11.5–15.5)
WBC: 8.4 K/uL (ref 4.0–10.5)
WBC: 7.7 K/uL (ref 4.0–10.5)
nRBC: 0 % (ref 0.0–0.2)
nRBC: 0 % (ref 0.0–0.2)

## 2024-03-07 LAB — URINALYSIS, ROUTINE W REFLEX MICROSCOPIC
Bacteria, UA: NONE SEEN
Bilirubin Urine: NEGATIVE
Glucose, UA: >=500 mg/dL — AB
Hgb urine dipstick: NEGATIVE
Ketones, ur: NEGATIVE mg/dL
Leukocytes,Ua: NEGATIVE
Nitrite: NEGATIVE
Protein, ur: NEGATIVE mg/dL
Specific Gravity, Urine: 1.013 (ref 1.005–1.030)
pH: 5 (ref 5.0–8.0)

## 2024-03-07 LAB — CREATININE, SERUM
Creatinine, Ser: 1.47 mg/dL — ABNORMAL HIGH (ref 0.61–1.24)
GFR, Estimated: 53 mL/min — ABNORMAL LOW

## 2024-03-07 LAB — COMPREHENSIVE METABOLIC PANEL WITH GFR
ALT: 22 U/L (ref 0–44)
AST: 32 U/L (ref 15–41)
Albumin: 3.9 g/dL (ref 3.5–5.0)
Alkaline Phosphatase: 92 U/L (ref 38–126)
Anion gap: 15 (ref 5–15)
BUN: 20 mg/dL (ref 8–23)
CO2: 20 mmol/L — ABNORMAL LOW (ref 22–32)
Calcium: 9.4 mg/dL (ref 8.9–10.3)
Chloride: 101 mmol/L (ref 98–111)
Creatinine, Ser: 1.55 mg/dL — ABNORMAL HIGH (ref 0.61–1.24)
GFR, Estimated: 50 mL/min — ABNORMAL LOW
Glucose, Bld: 234 mg/dL — ABNORMAL HIGH (ref 70–99)
Potassium: 4.5 mmol/L (ref 3.5–5.1)
Sodium: 136 mmol/L (ref 135–145)
Total Bilirubin: 0.4 mg/dL (ref 0.0–1.2)
Total Protein: 7.4 g/dL (ref 6.5–8.1)

## 2024-03-07 LAB — RESP PANEL BY RT-PCR (RSV, FLU A&B, COVID)  RVPGX2
Influenza A by PCR: POSITIVE — AB
Influenza B by PCR: NEGATIVE
Resp Syncytial Virus by PCR: NEGATIVE
SARS Coronavirus 2 by RT PCR: NEGATIVE

## 2024-03-07 MED ORDER — PROCHLORPERAZINE EDISYLATE 10 MG/2ML IJ SOLN: 5.0000 mg | Freq: Four times a day (QID) | INTRAMUSCULAR | Status: DC | PRN

## 2024-03-07 MED ORDER — CEPHALEXIN 250 MG PO CAPS: 500.0000 mg | ORAL_CAPSULE | Freq: Three times a day (TID) | ORAL | Status: DC | PRN

## 2024-03-07 MED ORDER — CARVEDILOL 3.125 MG PO TABS
3.1250 mg | ORAL_TABLET | Freq: Two times a day (BID) | ORAL | Status: DC
  Administered 2024-03-07 – 2024-03-08 (×3): 3.125 mg via ORAL
  Filled 2024-03-07 (×2): qty 1

## 2024-03-07 MED ORDER — ACETAMINOPHEN 500 MG PO TABS: 500.0000 mg | ORAL_TABLET | Freq: Four times a day (QID) | ORAL | Status: DC | PRN

## 2024-03-07 MED ORDER — HYDRALAZINE HCL 20 MG/ML IJ SOLN: 10.0000 mg | INTRAMUSCULAR | Status: DC | PRN

## 2024-03-07 MED ORDER — LEVALBUTEROL HCL 1.25 MG/0.5ML IN NEBU
1.2500 mg | INHALATION_SOLUTION | Freq: Four times a day (QID) | RESPIRATORY_TRACT | Status: DC
  Administered 2024-03-07 – 2024-03-08 (×4): 1.25 mg via RESPIRATORY_TRACT
  Filled 2024-03-07 (×2): qty 0.5

## 2024-03-07 MED ORDER — ZOLPIDEM TARTRATE 5 MG PO TABS
5.0000 mg | ORAL_TABLET | Freq: Every day | ORAL | Status: DC
  Administered 2024-03-07: 5 mg via ORAL
  Filled 2024-03-07: qty 1

## 2024-03-07 MED ORDER — ATORVASTATIN CALCIUM 40 MG PO TABS
40.0000 mg | ORAL_TABLET | Freq: Every day | ORAL | Status: DC
  Administered 2024-03-07 – 2024-03-08 (×2): 40 mg via ORAL
  Filled 2024-03-07: qty 1

## 2024-03-07 MED ORDER — ENOXAPARIN SODIUM 40 MG/0.4ML IJ SOSY: 40.0000 mg | PREFILLED_SYRINGE | INTRAMUSCULAR | Status: DC

## 2024-03-07 MED ORDER — FOLIC ACID 1 MG PO TABS
1.0000 mg | ORAL_TABLET | Freq: Every day | ORAL | Status: DC
  Administered 2024-03-07 – 2024-03-08 (×2): 1 mg via ORAL
  Filled 2024-03-07: qty 1

## 2024-03-07 MED ORDER — SODIUM CHLORIDE 0.9% FLUSH
3.0000 mL | Freq: Two times a day (BID) | INTRAVENOUS | Status: DC
  Administered 2024-03-07 (×2): 3 mL via INTRAVENOUS

## 2024-03-07 MED ORDER — ISOSORBIDE MONONITRATE ER 30 MG PO TB24
30.0000 mg | ORAL_TABLET | Freq: Every day | ORAL | Status: DC
  Administered 2024-03-07 – 2024-03-08 (×2): 30 mg via ORAL
  Filled 2024-03-07: qty 1

## 2024-03-07 MED ORDER — ADULT MULTIVITAMIN W/MINERALS CH
1.0000 | ORAL_TABLET | Freq: Every day | ORAL | Status: DC
  Administered 2024-03-07 – 2024-03-08 (×2): 1 via ORAL
  Filled 2024-03-07: qty 1

## 2024-03-07 MED ORDER — MELATONIN 5 MG PO TABS
5.0000 mg | ORAL_TABLET | Freq: Every evening | ORAL | Status: DC | PRN
  Administered 2024-03-08: 5 mg via ORAL

## 2024-03-07 MED ORDER — THIAMINE MONONITRATE 100 MG PO TABS
100.0000 mg | ORAL_TABLET | Freq: Every day | ORAL | Status: DC
  Administered 2024-03-07 – 2024-03-08 (×2): 100 mg via ORAL
  Filled 2024-03-07 (×2): qty 1

## 2024-03-07 MED ORDER — HEPARIN SODIUM (PORCINE) 5000 UNIT/ML IJ SOLN
5000.0000 [IU] | Freq: Three times a day (TID) | INTRAMUSCULAR | Status: DC
  Administered 2024-03-07 – 2024-03-08 (×3): 5000 [IU] via SUBCUTANEOUS
  Filled 2024-03-07 (×2): qty 1

## 2024-03-07 MED ORDER — SODIUM CHLORIDE 0.9% FLUSH
3.0000 mL | Freq: Two times a day (BID) | INTRAVENOUS | Status: DC
  Administered 2024-03-07: 3 mL via INTRAVENOUS

## 2024-03-07 MED ORDER — FLORANEX PO PACK
1.0000 g | PACK | Freq: Three times a day (TID) | ORAL | Status: DC
  Administered 2024-03-07 – 2024-03-08 (×4): 1 g via ORAL
  Filled 2024-03-07 (×4): qty 1

## 2024-03-07 MED ORDER — GUAIFENESIN 100 MG/5ML PO LIQD
10.0000 mL | Freq: Three times a day (TID) | ORAL | Status: DC
  Administered 2024-03-07 – 2024-03-08 (×3): 10 mL via ORAL
  Filled 2024-03-07 (×2): qty 10

## 2024-03-07 MED ORDER — SODIUM CHLORIDE 0.9 % IV SOLN: INTRAVENOUS | Status: DC

## 2024-03-07 MED ORDER — ACETAMINOPHEN 650 MG RE SUPP: 650.0000 mg | Freq: Four times a day (QID) | RECTAL | Status: DC | PRN

## 2024-03-07 MED ORDER — HYDROMORPHONE HCL 1 MG/ML IJ SOLN
0.5000 mg | INTRAMUSCULAR | Status: DC | PRN
  Administered 2024-03-08: 1 mg via INTRAVENOUS

## 2024-03-07 MED ORDER — INSULIN ASPART 100 UNIT/ML IJ SOLN: 0.0000 [IU] | Freq: Three times a day (TID) | INTRAMUSCULAR | Status: DC

## 2024-03-07 MED ORDER — IPRATROPIUM-ALBUTEROL 0.5-2.5 (3) MG/3ML IN SOLN
3.0000 mL | Freq: Four times a day (QID) | RESPIRATORY_TRACT | Status: DC
  Administered 2024-03-07 – 2024-03-08 (×5): 3 mL via RESPIRATORY_TRACT
  Filled 2024-03-07 (×3): qty 3

## 2024-03-07 MED ORDER — INSULIN ASPART 100 UNIT/ML IJ SOLN: 0.0000 [IU] | Freq: Every day | INTRAMUSCULAR | Status: DC

## 2024-03-07 MED ORDER — GABAPENTIN 300 MG PO CAPS
300.0000 mg | ORAL_CAPSULE | Freq: Three times a day (TID) | ORAL | Status: DC
  Administered 2024-03-07 – 2024-03-08 (×4): 300 mg via ORAL
  Filled 2024-03-07 (×3): qty 1

## 2024-03-07 MED ORDER — POLYETHYLENE GLYCOL 3350 17 G PO PACK: 17.0000 g | PACK | Freq: Every day | ORAL | Status: DC | PRN

## 2024-03-07 MED ORDER — OSELTAMIVIR PHOSPHATE 30 MG PO CAPS
30.0000 mg | ORAL_CAPSULE | Freq: Two times a day (BID) | ORAL | Status: DC
  Administered 2024-03-07 – 2024-03-08 (×3): 30 mg via ORAL
  Filled 2024-03-07 (×2): qty 1

## 2024-03-07 MED ORDER — METHYLPREDNISOLONE SODIUM SUCC 40 MG IJ SOLR
40.0000 mg | INTRAMUSCULAR | Status: DC
  Administered 2024-03-07 – 2024-03-08 (×2): 40 mg via INTRAVENOUS
  Filled 2024-03-07: qty 1

## 2024-03-07 MED ORDER — IPRATROPIUM BROMIDE 0.02 % IN SOLN
0.5000 mg | Freq: Four times a day (QID) | RESPIRATORY_TRACT | Status: DC
  Administered 2024-03-07 – 2024-03-08 (×4): 0.5 mg via RESPIRATORY_TRACT
  Filled 2024-03-07 (×2): qty 2.5

## 2024-03-07 MED ORDER — AMLODIPINE BESYLATE 5 MG PO TABS
10.0000 mg | ORAL_TABLET | Freq: Every day | ORAL | Status: DC
  Administered 2024-03-07 – 2024-03-08 (×2): 10 mg via ORAL
  Filled 2024-03-07: qty 2

## 2024-03-07 MED ORDER — INSULIN ASPART 100 UNIT/ML IJ SOLN
0.0000 [IU] | Freq: Three times a day (TID) | INTRAMUSCULAR | Status: DC
  Administered 2024-03-07: 5 [IU] via SUBCUTANEOUS
  Administered 2024-03-07 – 2024-03-08 (×2): 3 [IU] via SUBCUTANEOUS
  Administered 2024-03-08: 2 [IU] via SUBCUTANEOUS
  Filled 2024-03-07: qty 3
  Filled 2024-03-07: qty 5

## 2024-03-07 MED ORDER — DULOXETINE HCL 30 MG PO CPEP
60.0000 mg | ORAL_CAPSULE | Freq: Every evening | ORAL | Status: DC
  Administered 2024-03-07: 60 mg via ORAL
  Filled 2024-03-07: qty 2

## 2024-03-07 MED ORDER — OXYCODONE HCL 5 MG PO TABS
5.0000 mg | ORAL_TABLET | ORAL | Status: DC | PRN
  Administered 2024-03-07: 5 mg via ORAL
  Filled 2024-03-07: qty 1

## 2024-03-07 MED ORDER — ACETAMINOPHEN 325 MG PO TABS: 650.0000 mg | ORAL_TABLET | Freq: Four times a day (QID) | ORAL | Status: DC | PRN

## 2024-03-07 NOTE — Assessment & Plan Note (Addendum)
 Reports that he uses crack cocaine  every now and then -Last use few days ago -Will monitor for any withdrawal effects

## 2024-03-07 NOTE — Assessment & Plan Note (Addendum)
 Acute hospital acute respiratory failure due to influena A infection -Continue supportive measures, troponin oxygen, maintaining O2 sat >  94% Weaning off O2 accordingly -Continue encourage incentive spirometer, flutter valve use -Continue DuoNeb bronchodilators, mucolytics, -Short course of IV steroids

## 2024-03-07 NOTE — Inpatient Diabetes Management (Signed)
 Inpatient Diabetes Program Recommendations  AACE/ADA: New Consensus Statement on Inpatient Glycemic Control (2015)  Target Ranges:  Prepandial:   less than 140 mg/dL      Peak postprandial:   less than 180 mg/dL (1-2 hours)      Critically ill patients:  140 - 180 mg/dL   Lab Results  Component Value Date   GLUCAP 209 (H) 03/07/2024   HGBA1C 12.4 (H) 12/06/2023    Review of Glycemic Control  Latest Reference Range & Units 03/07/24 00:46 03/07/24 01:11 03/07/24 01:43 03/07/24 02:38 03/07/24 08:27  Glucose-Capillary 70 - 99 mg/dL 50 (L) 99 767 (H) 770 (H) 209 (H)   Diabetes history: DM 2 Outpatient Diabetes medications: Novolog  2-12 units tid, Lantus  20 units qhs, metformin  1000 mg bid Current orders for Inpatient glycemic control:  Novolog  0-6 units tid Solumedrol 40 mg Q24  Inpatient Diabetes Program Recommendations:    -   Semglee  10 units Watch on current correction scale  Patient is well known to inpatient DM team. Patient has had 41 ED visits in past 6 months and 8 hospital admission over the past 6 months. Per Diabetes Coordinator Note on 11/20 outpatient TOC pharmacy: Lantus  and Novolog  were both filled 01/24/24 and next fill date is 04/13/24(Lantus ) and 04/17/24(novolog ). Not sure what can be done to help patient regarding insulin . Pt with history of drug abuse and homelessness and these more than likely impact the ability for patient to manage his DM.   May need lower dose of basal insulin , however pt intake was altered with N/V/D. Will follow glucose trends.  Clotilda Bull RN, MSN, BC-ADM Inpatient Diabetes Coordinator Team Pager [phone] (8a-5p)

## 2024-03-07 NOTE — Assessment & Plan Note (Signed)
 Blood pressure remains stable, review home medication, resuming accordingly Currently listed Meds Norvasc , Imdur ,

## 2024-03-07 NOTE — Hospital Course (Addendum)
 David Wyatt is a 63 year old male with uncontrolled insulin -dependent DM II, HTN, HLD, Sosan abuse(cocaine ), diabetic neuropathy, chronic back pain, arthritis.  Presented to the emergency department with shortness of breath, nausea, vomiting, and diarrhea beginning on December 25th. His last full meal was at church on Christmas Day. He experienced several episodes of nausea, vomiting, and diarrhea without associated fever or chills. The patient attributed his symptoms to a viral gastroenteritis and continued taking his prescribed insulin  despite decreased oral intake. On presentation, the patient was found to be hypoglycemic and hypoxic  Aside for some nausea vomiting few episode of diarrhea did not had any fever or chills.  Thought that he had a stomach virus.   He continues to take his diabetic medication, insulin .  ED Evaluation: Blood pressure 110/65, pulse 82, temperature 98.4 F (36.9 C), temperature source Oral, resp. rate 16, height 5' 8 (1.727 m), weight 68 kg, SpO2 96%. 2 L of O2. (On arrival 80s on room air)  LABs: WBC 7.7, hemoglobin 12.2, creatinine 1.47,  Serum glucose 50 >>>>> 209,  Influenza A positive Chest x-ray-negative any infiltrate, no recent cardiopulmonary disease   Admitted for acute hypoxic respiratory failure in the setting of influenza A infection, and hypoglycemia.

## 2024-03-07 NOTE — Assessment & Plan Note (Addendum)
 Hypoglycemia in setting of uncontrolled diabetes mellitus type 2 recent A1c of 12.7 -POA: CBG 50 -Hypoglycemia in the setting of nausea/ vomiting / diarrhea, poor p.o. intake Still taking his home insulin  regimen-holding long-acting insulin , discontinuing metformin  -Checking CBG q. ACHS, SSI coverage CBG (last 3)  Recent Labs    03/07/24 0143 03/07/24 0238 03/07/24 0827  GLUCAP 232* 229* 209*

## 2024-03-07 NOTE — ED Provider Notes (Signed)
 "  Emergency Department Provider Note   I have reviewed the triage vital signs and the nursing notes.   HISTORY  Chief Complaint Hypoglycemia   HPI David Wyatt is a 63 y.o. male past history of insulin -dependent diabetes, hypertension, hypercholesterolemia presents to the emergency department with low blood sugar.  Patient has had some vomiting and diarrhea and was seen for this on Christmas Day.  He states that he was eating a lot of food given to him by churches and other people and was told he may have had a stomach virus.  The symptoms have resolved but his appetite has been less.  He continues using his insulin  and felt like his blood sugar was low causing him to check it and finding it to be in the 50s.  EMS was called.  They gave oral glucose and brought his blood sugars up.  While in EMS triage she reportedly became hypoxemic into the 80s.  He states that he is currently staying in a shelter with multiple people having cough and congestion symptoms.  Past Medical History:  Diagnosis Date   Angioedema of lips 07/28/2012   left upper (07/29/2012)   Arthritis    hands and back   Auditory hallucination 01/23/2024   Chronic back pain    Cocaine  abuse (HCC)    Diabetic neuropathy (HCC)    High cholesterol    Hypertension    Neuropathy    Type II diabetes mellitus (HCC)     Review of Systems  Constitutional: No fever/chills Cardiovascular: Denies chest pain. Respiratory: Denies shortness of breath. Positive cough.  Gastrointestinal: No abdominal pain.  No nausea, no vomiting.  No diarrhea.   Neurological: Negative for headaches.  ____________________________________________   PHYSICAL EXAM:  VITAL SIGNS: ED Triage Vitals  Encounter Vitals Group     BP 03/07/24 0049 115/80     Pulse Rate 03/07/24 0049 91     Resp 03/07/24 0049 18     Temp 03/07/24 0049 98.6 F (37 C)     Temp Source 03/07/24 0049 Oral     SpO2 03/07/24 0049 (!) 89 %     Weight 03/07/24 0048 149  lb 14.6 oz (68 kg)     Height 03/07/24 0048 5' 8 (1.727 m)   Constitutional: Alert and oriented. Well appearing and in no acute distress. Eyes: Conjunctivae are normal.  Head: Atraumatic. Nose: No congestion/rhinnorhea. Mouth/Throat: Mucous membranes are moist.   Neck: No stridor.  Cardiovascular: Normal rate, regular rhythm. Good peripheral circulation. Grossly normal heart sounds.   Respiratory: Normal respiratory effort.  No retractions. Lungs CTAB. Gastrointestinal: Soft and nontender. No distention.  Musculoskeletal: No lower extremity tenderness nor edema. No gross deformities of extremities. Neurologic:  Normal speech and language. No gross focal neurologic deficits are appreciated.  Skin:  Skin is warm, dry and intact. No rash noted.  ____________________________________________   LABS (all labs ordered are listed, but only abnormal results are displayed)  Labs Reviewed  RESP PANEL BY RT-PCR (RSV, FLU A&B, COVID)  RVPGX2 - Abnormal; Notable for the following components:      Result Value   Influenza A by PCR POSITIVE (*)    All other components within normal limits  CBC - Abnormal; Notable for the following components:   RBC 4.07 (*)    Hemoglobin 12.4 (*)    All other components within normal limits  COMPREHENSIVE METABOLIC PANEL WITH GFR - Abnormal; Notable for the following components:   CO2 20 (*)  Glucose, Bld 234 (*)    Creatinine, Ser 1.55 (*)    GFR, Estimated 50 (*)    All other components within normal limits  CBG MONITORING, ED - Abnormal; Notable for the following components:   Glucose-Capillary 50 (*)    All other components within normal limits  CBG MONITORING, ED - Abnormal; Notable for the following components:   Glucose-Capillary 232 (*)    All other components within normal limits  CBG MONITORING, ED - Abnormal; Notable for the following components:   Glucose-Capillary 229 (*)    All other components within normal limits  I-STAT VENOUS BLOOD  GAS, ED - Abnormal; Notable for the following components:   pCO2, Ven 37.7 (*)    pO2, Ven 66 (*)    Acid-base deficit 4.0 (*)    All other components within normal limits  URINALYSIS, ROUTINE W REFLEX MICROSCOPIC  CBG MONITORING, ED   ____________________________________________   PROCEDURES  Procedure(s) performed:   Procedures  CRITICAL CARE Performed by: Fonda KANDICE Law Total critical care time: 35 minutes Critical care time was exclusive of separately billable procedures and treating other patients. Critical care was necessary to treat or prevent imminent or life-threatening deterioration. Critical care was time spent personally by me on the following activities: development of treatment plan with patient and/or surrogate as well as nursing, discussions with consultants, evaluation of patient's response to treatment, examination of patient, obtaining history from patient or surrogate, ordering and performing treatments and interventions, ordering and review of laboratory studies, ordering and review of radiographic studies, pulse oximetry and re-evaluation of patient's condition.  Fonda Law, MD Emergency Medicine  ____________________________________________   INITIAL IMPRESSION / ASSESSMENT AND PLAN / ED COURSE  Pertinent labs & imaging results that were available during my care of the patient were reviewed by me and considered in my medical decision making (see chart for details).   This patient is Presenting for Evaluation of hypoglycemia, which does require a range of treatment options, and is a complaint that involves a high risk of morbidity and mortality.  The Differential Diagnoses include Flu A, COVID, CAP, CHF, sepsis, etc.  Clinical Laboratory Tests Ordered, included CBC shows no leukocytosis.  Mild anemia to 12.4.  CMP with normal LFTs and bilirubin. No acidosis.   Radiologic Tests Ordered, included CXR. I independently interpreted the images and agree with  radiology interpretation.   Cardiac Monitor Tracing which shows NSR.    Social Determinants of Health Risk patient with housing insecurity.   Consult complete with [facility]. Plan for admit.   Medical Decision Making: Summary:  The patient presents to the emergency department with hypoglycemia.  Appetite has been somewhat poor after recovering from vomiting and diarrhea illness.  He has a nasal cannula in place on my assessment.  Has had exposure to multiple sick contacts.  Plan for chest x-ray and flu swab along with plan for ambulation on pulse ox to fully assess degree of hypoxemia.   Reevaluation with update and discussion with patient. Remains on [place] O2. Flu A positive. Plan for admit. CBG not down-trending.   Patient's presentation is most consistent with acute presentation with potential threat to life or bodily function.   Disposition: admit  ____________________________________________  FINAL CLINICAL IMPRESSION(S) / ED DIAGNOSES  Final diagnoses:  Hypoxemia  Influenza A  Hypoglycemia   Note:  This document was prepared using Dragon voice recognition software and may include unintentional dictation errors.  Fonda Law, MD, [facility] Emergency Medicine    Chun Sellen, Fonda  G, MD 03/07/24 9481  "

## 2024-03-07 NOTE — Assessment & Plan Note (Signed)
 Uncontrolled diabetes mellitus type 2 with last A1c of 12.7 -Currently presented with hyperglycemia, initiating only sliding-scale insulin , checking CBG q. ACHS -Home regimen including metformin , long-acting insulin  needs to be reassessed and reinitiated accordingly

## 2024-03-07 NOTE — ED Notes (Signed)
 Pt given cup of water and blankets.

## 2024-03-07 NOTE — Assessment & Plan Note (Signed)
"   Continue PPI  "

## 2024-03-07 NOTE — Assessment & Plan Note (Signed)
 Monitoring, restarting as needed analgesics judiciously due to history of substance abuse Continue monitor oxygen as needed

## 2024-03-07 NOTE — Assessment & Plan Note (Signed)
 Likely viral gastroenteritis, may be contributed by influenza A -Continue conservative management, IV hydration, as needed antiemetics antidiarrhea meds -Will encourage p.o. intake -Will monitor closely No recent use of antibiotic, denies of having fever, chills, no leukocytosis or sick contact -low concern for C. difficile colitis

## 2024-03-07 NOTE — H&P (Addendum)
 " History and Physical   Patient: David Wyatt                            PCP: Delbert Clam, MD                    DOB: 11/14/1960            DOA: 03/07/2024 FMW:[id number]             DOS: 03/07/2024, 9:28 AM  Delbert Clam, MD  Patient coming from:   HOME  I have personally reviewed patient's medical records, in electronic medical records, including:  [facility], and care everywhere.    Chief Complaint:   Chief Complaint  Patient presents with   Hypoglycemia    History of present illness:    David Wyatt is a 63 year old male with uncontrolled insulin -dependent DM II, HTN, HLD, [place] abuse(cocaine ), diabetic neuropathy, chronic back pain, arthritis.  Presented to the emergency department with shortness of breath, nausea, vomiting, and diarrhea beginning on December 25th. His last full meal was at church on Christmas Day. He experienced several episodes of nausea, vomiting, and diarrhea without associated fever or chills. The patient attributed his symptoms to a viral gastroenteritis and continued taking his prescribed insulin  despite decreased oral intake. On presentation, the patient was found to be hypoglycemic and hypoxic  Aside for some nausea vomiting few episode of diarrhea did not had any fever or chills.  Thought that he had a stomach virus.   He continues to take his diabetic medication, insulin .  ED Evaluation: Blood pressure 110/65, pulse 82, temperature 98.4 F (36.9 C), temperature source Oral, resp. rate 16, height 5' 8 (1.727 m), weight 68 kg, SpO2 96%. 2 L of O2. (On arrival 80s on room air)  LABs: WBC 7.7, hemoglobin 12.2, creatinine 1.47,  Serum glucose 50 >>>>> 209,  Influenza A positive Chest x-ray-negative any infiltrate, no recent cardiopulmonary disease   Admitted for acute hypoxic respiratory failure in the setting of influenza A infection, and hypoglycemia.   Patient Denies having: Fever, Chills, Cough, SOB, Chest Pain, Abd pain,  headache, dizziness, lightheadedness,  Dysuria, Joint pain, rash, open wounds  Review of Systems: As per HPI, otherwise 10 point review of systems were negative.   ----------------------------------------------------------------------------------------------------------------------  Allergies[1]  Home MEDs:  Prior to Admission medications  Medication Sig Start Date End Date Taking? Authorizing Provider  Accu-Chek Softclix Lancets lancets Use as directed. 02/03/24   Barrett, Shona MATSU, PA-C  amLODipine  (NORVASC ) 10 MG tablet Take 1 tablet (10 mg total) by mouth daily. 02/05/24   Brien Belvie BRAVO, MD  atorvastatin  (LIPITOR) 40 MG tablet Take 1 tablet (40 mg total) by mouth daily. 02/03/24   Barrett, Shona MATSU, PA-C  Blood Glucose Monitoring Suppl (BLOOD GLUCOSE MONITOR SYSTEM) w/Device KIT Use in the morning, at noon, and at bedtime. 02/02/24   Dennise Lavada POUR, MD  carvedilol  (COREG ) 3.125 MG tablet Take 1 tablet (3.125 mg total) by mouth 2 (two) times daily with a meal. 02/17/24   Brien Belvie BRAVO, MD  cephALEXin  (KEFLEX ) 500 MG capsule Take 1 capsule (500 mg total) by mouth 4 (four) times daily. 02/04/24   Armenta Canning, MD  DULoxetine  (CYMBALTA ) 30 MG capsule Take 1 capsule (30 mg total) by mouth daily with breakfast. 02/17/24   Brien Belvie BRAVO, MD  DULoxetine  (CYMBALTA ) 60 MG capsule Take 1 capsule (60 mg total) by mouth every evening. 02/17/24  Brien Belvie BRAVO, MD  folic acid  (FOLVITE ) 1 MG tablet Take 1 tablet (1 mg total) by mouth daily. 01/25/24   Caleen Qualia, MD  gabapentin  (NEURONTIN ) 300 MG capsule Take 2 capsules (600 mg total) by mouth 3 (three) times daily. 02/17/24   Brien Belvie BRAVO, MD  glucose blood (ACCU-CHEK GUIDE TEST) test strip Use as instructed to check blood sugar three times daily 02/03/24   Barrett, Rhonda G, PA-C  insulin  aspart (NOVOLOG ) 100 UNIT/ML FlexPen Before each meal 3 times a day, If less than 150 do not give, 151-199 - 2 units, 200-250 - 6 units,  251-299 - 8 units,  300-349 - 10 units,  350 or above 12 units. 02/03/24   Barrett, Shona MATSU, PA-C  insulin  glargine (LANTUS ) 100 UNIT/ML Solostar Pen Inject 20 Units into the skin at bedtime. 02/17/24   Brien Belvie BRAVO, MD  Insulin  Pen Needle 32G X 4 MM MISC Use 1 each to inject insulin  into the skin 4 (four) times daily -  before meals and at bedtime. 02/17/24   Brien Belvie BRAVO, MD  isosorbide  mononitrate (IMDUR ) 30 MG 24 hr tablet Take 1 tablet (30 mg total) by mouth daily. 02/03/24   Barrett, Shona MATSU, PA-C  metFORMIN  (GLUCOPHAGE ) 1000 MG tablet Take 1 tablet (1,000 mg total) by mouth 2 (two) times daily with a meal. 02/17/24   Brien Belvie BRAVO, MD  Multiple Vitamin (MULTIVITAMIN WITH MINERALS) TABS tablet Take 1 tablet by mouth daily. 01/25/24   Caleen Qualia, MD  ondansetron  (ZOFRAN -ODT) 4 MG disintegrating tablet Take 1 tablet (4 mg total) by mouth every 8 (eight) hours as needed for nausea or vomiting. 03/04/24   Neldon Hamp RAMAN, PA  thiamine  (VITAMIN B1) 100 MG tablet Take 1 tablet (100 mg total) by mouth daily. 01/25/24   Amin, Sumayya, MD  traZODone  (DESYREL ) 50 MG tablet Take 1 tablet (50 mg total) by mouth at bedtime as needed for sleep. 02/05/24   Brien Belvie BRAVO, MD    PRN MEDs: acetaminophen  **OR** acetaminophen , hydrALAZINE , HYDROmorphone  (DILAUDID ) injection, melatonin, oxyCODONE , polyethylene glycol, prochlorperazine   Past Medical History:  Diagnosis Date   Angioedema of lips 07/28/2012   left upper (07/29/2012)   Arthritis    hands and back   Auditory hallucination 01/23/2024   Chronic back pain    Cocaine  abuse (HCC)    Diabetic neuropathy (HCC)    High cholesterol    Hypertension    Neuropathy    Type II diabetes mellitus (HCC)     Past Surgical History:  Procedure Laterality Date   BACK SURGERY     CYSTOSCOPY W/ URETERAL STENT PLACEMENT Right 02/23/2021   Procedure: CYSTOSCOPY WITH RETROGRADE PYELOGRAM/URETERAL STENT PLACEMENT;  Surgeon: Selma Donnice SAUNDERS, MD;   Location: [facility];  Service: Urology;  Laterality: Right;   CYSTOSCOPY/URETEROSCOPY/HOLMIUM LASER/STENT PLACEMENT Right 12/05/2022   Procedure: CYSTOSCOPY, RIGHT URETEROSCOPY, AND RIGHT URETERAL STENT PLACEMENT;  Surgeon: Shane Steffan BROCKS, MD;  Location: [facility];  Service: Urology;  Laterality: Right;  90 MINUTES   HERNIA REPAIR Right 04/01/2012   I & D EXTREMITY Left 12/13/2012   Procedure: IRRIGATION AND DEBRIDEMENT LEFT THUMB;  Surgeon: Franky SAUNDERS Curia, MD;  Location: [facility] OR;  Service: Orthopedics;  Laterality: Left;   INCISION AND DRAINAGE OF WOUND     boil on back/notes 07/14/2008 (07/29/2012)   INGUINAL HERNIA REPAIR  04/01/2012   Procedure: HERNIA REPAIR INGUINAL ADULT;  Surgeon: Sherlean JINNY Laughter, MD;  Location: [facility] OR;  Service: General;  Laterality:  Right;   INGUINAL HERNIA REPAIR Left 09/06/2016   Procedure: OPEN REPAIR LEFT INGUINAL HERNIA;  Surgeon: Tanda Locus, MD;  Location: [facility] OR;  Service: General;  Laterality: Left;   INSERTION OF MESH Left 09/06/2016   Procedure: INSERTION OF MESH;  Surgeon: Tanda Locus, MD;  Location: [facility] OR;  Service: General;  Laterality: Left;   TONSILLECTOMY       reports that he has quit smoking. His smoking use included cigarettes. He has a 7.5 pack-year smoking history. He quit smokeless tobacco use about 8 years ago. He reports that he does not currently use alcohol. He reports that he does not currently use drugs.   Family History  Problem Relation Age of Onset   Diabetes Mother    Kidney disease Mother    Hyperlipidemia Mother    Hyperlipidemia Father    Diabetes Father     Physical Exam:   Vitals:   03/07/24 0049 03/07/24 0053 03/07/24 0349 03/07/24 0745  BP: 115/80  110/65 110/67  Pulse: 91  82 92  Resp: 18  16 18   Temp: 98.6 F (37 C)  98.4 F (36.9 C)   TempSrc: Oral  Oral   SpO2: (!) 89% 91% 96% 97%  Weight:      Height:       Constitutional: NAD, calm, comfortable Eyes: PERRL, lids and conjunctivae normal ENMT: Mucous membranes  are moist. Posterior pharynx clear of any exudate or lesions.Normal dentition.  Neck: normal, supple, no masses, no thyromegaly Respiratory: clear to auscultation bilaterally, no wheezing, no crackles. Normal respiratory effort. No accessory muscle use.  Cardiovascular: Regular rate and rhythm, no murmurs / rubs / gallops. No extremity edema. 2+ pedal pulses. No carotid bruits.  Abdomen: no tenderness, no masses palpated. No hepatosplenomegaly. Bowel sounds positive.  Musculoskeletal: no clubbing / cyanosis. No joint deformity upper and lower extremities. Good ROM, no contractures. Normal muscle tone.  Neurologic: CN II-XII grossly intact. Sensation intact, DTR normal. Strength 5/5 in all 4.  Psychiatric: Normal judgment and insight. Alert and oriented x 3. Normal mood.  Skin: no rashes, lesions, ulcers. No induration          Labs on admission:    I have personally reviewed following labs and imaging studies  CBC: Recent Labs  Lab 03/04/24 1104 03/07/24 0230 03/07/24 0314 03/07/24 0532  WBC 13.2* 8.4  --  7.7  HGB 13.5 12.4* 13.3 12.2*  HCT 41.9 39.3 39.0 37.5*  MCV 95.7 96.6  --  95.7  PLT 311 309  --  290   Basic Metabolic Panel: Recent Labs  Lab 03/04/24 1104 03/07/24 0230 03/07/24 0314 03/07/24 0532  NA 135 136 137  --   K 4.7 4.5 4.6  --   CL 102 101  --   --   CO2 21* 20*  --   --   GLUCOSE 160* 234*  --   --   BUN 25* 20  --   --   CREATININE 1.23 1.55*  --  1.47*  CALCIUM  9.1 9.4  --   --    GFR: Estimated Creatinine Clearance: 48.8 mL/min (A) (by C-G formula based on SCr of 1.47 mg/dL (H)). Liver Function Tests: Recent Labs  Lab 03/04/24 1104 03/07/24 0230  AST 25 32  ALT 30 22  ALKPHOS 104 92  BILITOT 1.0 0.4  PROT 7.7 7.4  ALBUMIN 3.9 3.9   Recent Labs  Lab 03/04/24 1104  LIPASE 14    CBG: Recent Labs  Lab  03/07/24 0046 03/07/24 0111 03/07/24 0143 03/07/24 0238 03/07/24 0827  GLUCAP 50* 99 232* 229* 209*    Urine analysis:     Component Value Date/Time   COLORURINE YELLOW 02/04/2024 0942   APPEARANCEUR CLOUDY (A) 02/04/2024 0942   LABSPEC 1.023 02/04/2024 0942   PHURINE 5.0 02/04/2024 0942   GLUCOSEU >=500 (A) 02/04/2024 0942   HGBUR NEGATIVE 02/04/2024 0942   BILIRUBINUR NEGATIVE 02/04/2024 0942   BILIRUBINUR negative 03/26/2022 1531   BILIRUBINUR small 02/16/2019 1618   KETONESUR NEGATIVE 02/04/2024 0942   PROTEINUR 30 (A) 02/04/2024 0942   UROBILINOGEN 0.2 03/26/2022 1531   UROBILINOGEN 1.0 10/12/2012 0149   NITRITE POSITIVE (A) 02/04/2024 0942   LEUKOCYTESUR LARGE (A) 02/04/2024 0942    Last A1C:  Lab Results  Component Value Date   HGBA1C 12.4 (H) 12/06/2023     Radiologic Exams on Admission:   DG Chest 2 View Result Date: 03/07/2024 CLINICAL DATA:  Shortness of breath.  Hypoxia. EXAM: CHEST - 2 VIEW COMPARISON:  12/02/2023 FINDINGS: The heart size and mediastinal contours are within normal limits. Both lungs are clear. The visualized skeletal structures are unremarkable. IMPRESSION: No active cardiopulmonary disease. Electronically Signed   By: Norleen DELENA Kil M.D.   On: 03/07/2024 05:38    EKG:   Independently reviewed.  Orders placed or performed during the hospital encounter of 03/07/24   EKG 12-Lead   ---------------------------------------------------------------------------------------------------------------------------------------    Assessment / Plan:   Principal Problem:   Acute hypoxic respiratory failure (HCC) Active Problems:   Nausea vomiting and diarrhea   Uncontrolled type 2 diabetes mellitus with hypoglycemia, with long-term current use of insulin  (HCC)   Influenza A   Chronic back pain   Essential hypertension, benign   Diabetic neuropathy (HCC)   GERD (gastroesophageal reflux disease)   Uncontrolled type 2 diabetes mellitus with hyperglycemia (HCC)   History of cocaine  use   MDD (major depressive disorder), recurrent severe, without psychosis (HCC)    Hyperlipidemia   Assessment and Plan: * Acute hypoxic respiratory failure (HCC) Acute hospital acute respiratory failure due to influena A infection -Continue supportive measures, troponin oxygen, maintaining O2 sat >  94% Weaning off O2 accordingly -Continue encourage incentive spirometer, flutter valve use -Continue DuoNeb bronchodilators, mucolytics, -Short course of IV steroids  Influenza A  Influenza A positive, in setting of acute hypoxic -Treatment for acute respiratory failure as above -Continue supportive therapy -Since symptomatic, -  continue Tamiflu , mucolytics, Tylenol , as needed nebs  Uncontrolled type 2 diabetes mellitus with hypoglycemia, with long-term current use of insulin  (HCC) Hypoglycemia in setting of uncontrolled diabetes mellitus type 2 recent A1c of 12.7 -POA: CBG 50 -Hypoglycemia in the setting of nausea/ vomiting / diarrhea, poor p.o. intake Still taking his home insulin  regimen-holding long-acting insulin , discontinuing metformin  -Checking CBG q. ACHS, SSI coverage CBG (last 3)  Recent Labs    03/07/24 0143 03/07/24 0238 03/07/24 0827  GLUCAP 232* 229* 209*      Nausea vomiting and diarrhea Likely viral gastroenteritis, may be contributed by influenza A -Continue conservative management, IV hydration, as needed antiemetics antidiarrhea meds -Will encourage p.o. intake -Will monitor closely No recent use of antibiotic, denies of having fever, chills, no leukocytosis or sick contact -low concern for C. difficile colitis   MDD (major depressive disorder), recurrent severe, without psychosis (HCC) Mood stable,  Reviewing home medication, will continue meds accordingly including Cymbalta , Neurontin , as needed trazodone   History of cocaine  use Reports that he uses crack cocaine  every now and then -  Last use few days ago -Will monitor for any withdrawal effects  Uncontrolled type 2 diabetes mellitus with hyperglycemia (HCC) Uncontrolled  diabetes mellitus type 2 with last A1c of 12.7 -Currently presented with hyperglycemia, initiating only sliding-scale insulin , checking CBG q. ACHS -Home regimen including metformin , long-acting insulin  needs to be reassessed and reinitiated accordingly  GERD (gastroesophageal reflux disease) Continue PPI  Diabetic neuropathy (HCC) Continue Neurontin   Essential hypertension, benign Blood pressure remains stable, review home medication, resuming accordingly Currently listed Meds Norvasc , Imdur ,  Chronic back pain Monitoring, restarting as needed analgesics judiciously due to history of substance abuse Continue monitor oxygen as needed  Hyperlipidemia Continue statins              Consults called: TOC/diabetic code -------------------------------------------------------------------------------------------------------------------------------------------- DVT prophylaxis:  heparin  injection 5,000 Units Start: 03/07/24 0845 SCDs Start: 03/07/24 0834   Code Status:   Code Status: Full Code   Admission status: Patient will be admitted as Observation, with a greater than 2 midnight length of stay. Level of care: Telemetry   Family Communication:  none at bedside  (The above findings and plan of care has been discussed with patient in detail, the patient expressed understanding and agreement of above plan)  --------------------------------------------------------------------------------------------------------------------------------------------------  Disposition Plan:  Anticipated 1-2 days Status is: Observation The patient remains OBS appropriate and will d/c before 2 midnights.     ----------------------------------------------------------------------------------------------------------------------------------------------------  Time spent:  24  Min.  Was spent seeing and evaluating the patient, reviewing all medical records, drawn plan of care.  SIGNED: Adriana DELENA Grams, MD, FHM. FAAFP. [place] [facility], Pager  (Please use [email or web address] to page/ or secure chat through epic) If 7PM-7AM, please contact night-coverage [email or web address],  03/07/2024, 9:28 AM     [1]  Allergies Allergen Reactions   Lisinopril  Anaphylaxis, Swelling and Other (See Comments)    Lip Swelling and angioedema, too    "

## 2024-03-07 NOTE — Assessment & Plan Note (Signed)
"   Influenza A positive, in setting of acute hypoxic -Treatment for acute respiratory failure as above -Continue supportive therapy -Since symptomatic, -  continue Tamiflu , mucolytics, Tylenol , as needed nebs "

## 2024-03-07 NOTE — ED Triage Notes (Signed)
 [facility] where he was about to go to bed and he noticed he felt funny so he took his blood sugar. Pt found his sugar to be 55. Pt called ems . Ems got cbg of 35. Ems gave 1 oral glucose cbg went up to 53  ems gave 1 more oral glucose to bring his sugar to 59. Pt given sandwich and drink in triage.   Ems  Bp 122/76 Hr 74 Rr 18  98% ra

## 2024-03-07 NOTE — Assessment & Plan Note (Signed)
 Continue Neurontin

## 2024-03-07 NOTE — Assessment & Plan Note (Signed)
 Continue statins

## 2024-03-07 NOTE — ED Notes (Signed)
 Pt states he cannot urinate at this time to provide sample, urinal at bedside.

## 2024-03-07 NOTE — Assessment & Plan Note (Signed)
 Mood stable,  Reviewing home medication, will continue meds accordingly including Cymbalta , Neurontin , as needed trazodone 

## 2024-03-07 NOTE — ED Provider Triage Note (Signed)
 Emergency Medicine Provider Triage Evaluation Note  Kolbie Lepkowski , a 63 y.o. male  was evaluated in triage.  Pt complains of hypoglycemia.  Reports he noticed this this evening prior to getting ready for bed.  States that he has had nausea, vomiting and diarrhea over the last 2 days but reports that this self resolved this morning and he has not had any issues with nausea vomiting or diarrhea today.  Reports that he has taken his insulin  at home.  States that earlier he checked his sugar and noted it was in the 300s and took 4 units insulin .  Denies take any repeat doses.  Alert and oriented x 4.  Denies any physical complaints.  Is somnolent.  Triage nurse made aware patient requires room.  Review of Systems  Positive:  Negative:   Physical Exam  BP 115/80 (BP Location: Left Arm)   Pulse 91   Temp 98.6 F (37 C) (Oral)   Resp 18   Ht 5' 8 (1.727 m)   Wt 68 kg   SpO2 91%   BMI 22.79 kg/m  Gen:   Awake, no distress   Resp:  Normal effort  MSK:   Moves extremities without difficulty  Other:    Medical Decision Making  Medically screening exam initiated at 1:01 AM.  Appropriate orders placed.  [name] was informed that the remainder of the evaluation will be completed by another provider, this initial triage assessment does not replace that evaluation, and the importance of remaining in the ED until their evaluation is complete.     Ruthell Lonni FALCON, PA-C 03/07/24 0102

## 2024-03-07 NOTE — ED Notes (Signed)
 Asked pt to urinate but stated he couldn't urinate, gave pt urinal.

## 2024-03-08 ENCOUNTER — Other Ambulatory Visit: Payer: Self-pay

## 2024-03-08 DIAGNOSIS — J9601 Acute respiratory failure with hypoxia: Secondary | ICD-10-CM | POA: Diagnosis not present

## 2024-03-08 LAB — CBC
HCT: 35.6 % — ABNORMAL LOW (ref 39.0–52.0)
Hemoglobin: 11.7 g/dL — ABNORMAL LOW (ref 13.0–17.0)
MCH: 31 pg (ref 26.0–34.0)
MCHC: 32.9 g/dL (ref 30.0–36.0)
MCV: 94.2 fL (ref 80.0–100.0)
Platelets: 314 K/uL (ref 150–400)
RBC: 3.78 MIL/uL — ABNORMAL LOW (ref 4.22–5.81)
RDW: 13.4 % (ref 11.5–15.5)
WBC: 5.6 K/uL (ref 4.0–10.5)
nRBC: 0 % (ref 0.0–0.2)

## 2024-03-08 LAB — BASIC METABOLIC PANEL WITH GFR
Anion gap: 11 (ref 5–15)
BUN: 20 mg/dL (ref 8–23)
CO2: 25 mmol/L (ref 22–32)
Calcium: 8.6 mg/dL — ABNORMAL LOW (ref 8.9–10.3)
Chloride: 98 mmol/L (ref 98–111)
Creatinine, Ser: 1.28 mg/dL — ABNORMAL HIGH (ref 0.61–1.24)
GFR, Estimated: >60 mL/min
Glucose, Bld: 360 mg/dL — ABNORMAL HIGH (ref 70–99)
Potassium: 5 mmol/L (ref 3.5–5.1)
Sodium: 134 mmol/L — ABNORMAL LOW (ref 135–145)

## 2024-03-08 LAB — PROTIME-INR
INR: 1 (ref 0.8–1.2)
Prothrombin Time: 14.1 s (ref 11.4–15.2)

## 2024-03-08 LAB — CBG MONITORING, ED
Glucose-Capillary: 237 mg/dL — ABNORMAL HIGH (ref 70–99)
Glucose-Capillary: 298 mg/dL — ABNORMAL HIGH (ref 70–99)

## 2024-03-08 LAB — MAGNESIUM: Magnesium: 1.5 mg/dL — ABNORMAL LOW (ref 1.7–2.4)

## 2024-03-08 LAB — HEMOGLOBIN A1C
Hgb A1c MFr Bld: 11.8 % — ABNORMAL HIGH (ref 4.8–5.6)
Mean Plasma Glucose: 291.96 mg/dL

## 2024-03-08 LAB — PHOSPHORUS: Phosphorus: 2.9 mg/dL (ref 2.5–4.6)

## 2024-03-08 LAB — APTT: aPTT: 29 s (ref 24–36)

## 2024-03-08 MED ORDER — GUAIFENESIN 100 MG/5ML PO LIQD: 10.0000 mL | Freq: Three times a day (TID) | ORAL | Status: DC

## 2024-03-08 MED ORDER — OSELTAMIVIR PHOSPHATE 30 MG PO CAPS
30.0000 mg | ORAL_CAPSULE | Freq: Two times a day (BID) | ORAL | 0 refills | Status: DC
  Filled 2024-03-08: qty 10, 5d supply, fill #0

## 2024-03-08 MED ORDER — GUAIFENESIN 100 MG/5ML PO LIQD
10.0000 mL | Freq: Three times a day (TID) | ORAL | 0 refills | Status: AC
  Filled 2024-03-08: qty 120, 4d supply, fill #0

## 2024-03-08 MED ORDER — OXYCODONE HCL 5 MG PO TABS: 5.0000 mg | ORAL_TABLET | ORAL | Status: DC | PRN

## 2024-03-08 MED ORDER — HEPARIN SODIUM (PORCINE) 5000 UNIT/ML IJ SOLN: 5000.0000 [IU] | Freq: Three times a day (TID) | INTRAMUSCULAR | Status: DC

## 2024-03-08 NOTE — Discharge Instructions (Signed)
"  Follow up with PCP within one week.   "

## 2024-03-08 NOTE — ED Notes (Signed)
 Lunch tray ordered

## 2024-03-08 NOTE — ED Notes (Signed)
 The patient received a lunch tray, but is requesting pancakes or french toast.  The patient was made aware that request requires approval from the kitchen for a breakfast order to be placed. Kitchen staff contacted and a pancake ordered.

## 2024-03-08 NOTE — Inpatient Diabetes Management (Signed)
 Inpatient Diabetes Program Recommendations  AACE/ADA: New Consensus Statement on Inpatient Glycemic Control   Target Ranges:  Prepandial:   less than 140 mg/dL      Peak postprandial:   less than 180 mg/dL (1-2 hours)      Critically ill patients:  140 - 180 mg/dL   Lab Results  Component Value Date   GLUCAP 237 (H) 03/08/2024   HGBA1C 11.8 (H) 03/08/2024    Latest Reference Range & Units 03/07/24 08:27 03/07/24 13:58 03/07/24 16:46 03/07/24 17:40 03/07/24 23:25 03/08/24 07:59  Glucose-Capillary 70 - 99 mg/dL 790 (H) 731 (H) 700 (H) 376 (H) 347 (H) 237 (H)   Review of Glycemic Control  Diabetes history: DM 2  Outpatient Diabetes medications:  Novolog  2-12 units TID Lantus  20 units at bedtime Metformin  1000 mg bid  Current orders for Inpatient glycemic control:  Novolog  0-6 units TID  Solumedrol 40 mg daily    Inpatient Diabetes Program Recommendations:     Please consider starting Semglee  10 units  Thanks,  Lavanda Search, RN, MSN, [facility]  Inpatient Diabetes Coordinator  Pager [phone] (8a-5p)

## 2024-03-08 NOTE — ED Notes (Signed)
 Patient removing monitoring equipment because he states that it is making him ache. RN explained that we are monitoring his vitals the equipment on. Patient agreed to not take them off.

## 2024-03-08 NOTE — ED Notes (Signed)
 Pt consumed 18 graham crackers and 3 peanut butters between 1500 - 2300

## 2024-03-08 NOTE — Discharge Summary (Signed)
 " [facility] Phys[facility] Discharge Summary   Patient name: David Wyatt  Admit date:     03/07/2024  Discharge date: 03/08/2024  Attending Physician: Nakayla Rorabaugh, HASSAN [[id number]]  Discharge Physician: Norval Bar   PCP: Delbert Clam, MD  Admitted From: Home  Disposition:  Home  Recommendations for Outpatient Follow-up:  Follow up with PCP in 1-2 weeks  Home Health:No Equipment/Devices: @ECDMELIST @  Discharge Condition:Stable CODE STATUS:FULL Diet recommendation: Heart Healthy/Diabetic Fluid Restriction: None  Hospital Summary: David Wyatt is a 64 year old male with uncontrolled insulin -dependent DM II, HTN, HLD, Sosan abuse(cocaine ), diabetic neuropathy, chronic back pain, arthritis. Presented to the emergency department with shortness of breath, nausea, vomiting, and diarrhea beginning on December 25th. His last full meal was at church on Christmas Day. He experienced several episodes of nausea, vomiting, and diarrhea without associated fever or chills. The patient attributed his symptoms to a viral gastroenteritis and continued taking his prescribed insulin  despite decreased oral intake. On presentation, the patient was found to be hypoglycemic and hypoxic. Admitted for acute hypoxic respiratory failure, Acute influenza A infection, hypoglycemia with T2DM.   Improved quickly with IV hydration and transiently holding home insulin  regimen. Started on oseltamivir  to finish 5 day course. Started to get hyperglycemic as tolerating diet well and resumed on home insulin  regimen. Weaned off oxygen to room air. Counseled to eat and drink well to compensate for fluid losses due to viral gastrointestinal symptoms. Stable for discharge to follow up with  PCP within one week.   Discharge Diagnoses:  Principal Problem:   Acute hypoxic respiratory failure (HCC) Active Problems:   Nausea vomiting and diarrhea   Uncontrolled type 2 diabetes mellitus with hypoglycemia, with long-term  current use of insulin  (HCC)   Influenza A   Chronic back pain   Essential hypertension, benign   Diabetic neuropathy (HCC)   GERD (gastroesophageal reflux disease)   Uncontrolled type 2 diabetes mellitus with hyperglycemia (HCC)   History of cocaine  use   MDD (major depressive disorder), recurrent severe, without psychosis (HCC)   Hyperlipidemia   Discharge Instructions  Discharge Instructions     Diet Carb Modified   Complete by: As directed    Increase activity slowly   Complete by: As directed       Allergies as of 03/08/2024       Reactions   Lisinopril  Anaphylaxis, Swelling, Other (See Comments)   Lip Swelling and angioedema, too        Medication List     TAKE these medications    Accu-Chek Guide Test test strip Generic drug: glucose blood Use as instructed to check blood sugar three times daily   Accu-Chek Softclix Lancets lancets Use as directed.   amLODipine  10 MG tablet Commonly known as: NORVASC  Take 1 tablet (10 mg total) by mouth daily.   atorvastatin  40 MG tablet Commonly known as: LIPITOR Take 1 tablet (40 mg total) by mouth daily.   carvedilol  3.125 MG tablet Commonly known as: COREG  Take 1 tablet (3.125 mg total) by mouth 2 (two) times daily with a meal.   CertaVite/Antioxidants Tabs Take 1 tablet by mouth daily.   DULoxetine  30 MG capsule Commonly known as: CYMBALTA  Take 1 capsule (30 mg total) by mouth daily with breakfast.   DULoxetine  60 MG capsule Commonly known as: CYMBALTA  Take 1 capsule (60 mg total) by mouth every evening.   folic acid  1 MG tablet Commonly known as: FOLVITE  Take 1 tablet (1 mg total) by mouth daily.   gabapentin  300 MG  capsule Commonly known as: NEURONTIN  Take 2 capsules (600 mg total) by mouth 3 (three) times daily.   guaiFENesin  100 MG/5ML liquid Commonly known as: ROBITUSSIN Take 10 mLs by mouth every 8 (eight) hours.   insulin  aspart 100 UNIT/ML FlexPen Commonly known as: NOVOLOG  Before  each meal 3 times a day, If less than 150 do not give, 151-199 - 2 units, 200-250 - 6 units, 251-299 - 8 units,  300-349 - 10 units,  350 or above 12 units.   insulin  glargine 100 UNIT/ML Solostar Pen Commonly known as: LANTUS  Inject 20 Units into the skin at bedtime.   Insulin  Pen Needle 32G X 4 MM Misc Use 1 each to inject insulin  into the skin 4 (four) times daily -  before meals and at bedtime.   isosorbide  mononitrate 30 MG 24 hr tablet Commonly known as: IMDUR  Take 1 tablet (30 mg total) by mouth daily.   metFORMIN  1000 MG tablet Commonly known as: GLUCOPHAGE  Take 1 tablet (1,000 mg total) by mouth 2 (two) times daily with a meal.   ondansetron  4 MG disintegrating tablet Commonly known as: ZOFRAN -ODT Take 1 tablet (4 mg total) by mouth every 8 (eight) hours as needed for nausea or vomiting.   oseltamivir  30 MG capsule Commonly known as: TAMIFLU  Take 1 capsule (30 mg total) by mouth 2 (two) times daily for 5 days.   thiamine  100 MG tablet Commonly known as: VITAMIN B1 Take 1 tablet (100 mg total) by mouth daily.   traZODone  50 MG tablet Commonly known as: DESYREL  Take 1 tablet (50 mg total) by mouth at bedtime as needed for sleep.        Allergies[1]  Discharge Exam: Vitals:   03/08/24 1015 03/08/24 1100  BP: 124/76 126/80  Pulse: 75 70  Resp: 13 12  Temp:    SpO2: 93% 91%    Physical Exam Vitals and nursing note reviewed.  Constitutional:      General: He is not in acute distress.    Appearance: He is not ill-appearing.  HENT:     Head: Normocephalic and atraumatic.  Cardiovascular:     Rate and Rhythm: Normal rate and regular rhythm.     Pulses: Normal pulses.     Heart sounds: Normal heart sounds.  Pulmonary:     Effort: Pulmonary effort is normal.     Breath sounds: Normal breath sounds.  Abdominal:     General: Bowel sounds are normal.     Palpations: Abdomen is soft.  Neurological:     Mental Status: He is alert. Mental status is at  baseline.     The results of significant diagnostics from this hospitalization (including imaging, microbiology, ancillary and laboratory) are listed below for reference.    Microbiology: Recent Results (from the past 240 hours)  Resp panel by RT-PCR (RSV, Flu A&B, Covid) Anterior Nasal Swab     Status: Abnormal   Collection Time: 03/07/24  3:35 AM   Specimen: Anterior Nasal Swab  Result Value Ref Range Status   SARS Coronavirus 2 by RT PCR NEGATIVE NEGATIVE Final   Influenza A by PCR POSITIVE (A) NEGATIVE Final   Influenza B by PCR NEGATIVE NEGATIVE Final    Comment: (NOTE) The Xpert Xpress SARS-CoV-2/FLU/RSV plus assay is intended as an aid in the diagnosis of influenza from Nasopharyngeal swab specimens and should not be used as a sole basis for treatment. Nasal washings and aspirates are unacceptable for Xpert Xpress SARS-CoV-2/FLU/RSV testing.  Fact Sheet for Patients:  bloggercourse.com  Fact Sheet for Healthcare Providers: seriousbroker.it  This test is not yet approved or cleared by the [place]  FDA and has been authorized for detection and/or diagnosis of SARS-CoV-2 by FDA under an Emergency Use Authorization (EUA). This EUA will remain in effect (meaning this test can be used) for the duration of the COVID-19 declaration under Section 564(b)(1) of the Act, 21 U.S.C. section 360bbb-3(b)(1), unless the authorization is terminated or revoked.     Resp Syncytial Virus by PCR NEGATIVE NEGATIVE Final    Comment: (NOTE) Fact Sheet for Patients: bloggercourse.com  Fact Sheet for Healthcare Providers: seriousbroker.it  This test is not yet approved or cleared by the [place]  FDA and has been authorized for detection and/or diagnosis of SARS-CoV-2 by FDA under an Emergency Use Authorization (EUA). This EUA will remain in effect (meaning this test can be used) for  the duration of the COVID-19 declaration under Section 564(b)(1) of the Act, 21 U.S.C. section 360bbb-3(b)(1), unless the authorization is terminated or revoked.  Performed at [facility], [address] [place], [place] [address]      Labs: ProBNP, BNP (last 5 results) No results for input(s): PROBNP, BNP in the last 8760 hours. Basic Metabolic Panel: Recent Labs  Lab 03/04/24 1104 03/07/24 0230 03/07/24 0314 03/07/24 0532 03/08/24 0150  NA 135 136 137  --  134*  K 4.7 4.5 4.6  --  5.0  CL 102 101  --   --  98  CO2 21* 20*  --   --  25  GLUCOSE 160* 234*  --   --  360*  BUN 25* 20  --   --  20  CREATININE 1.23 1.55*  --  1.47* 1.28*  CALCIUM  9.1 9.4  --   --  8.6*  MG  --   --   --   --  1.5*  PHOS  --   --   --   --  2.9   Liver Function Tests: Recent Labs  Lab 03/04/24 1104 03/07/24 0230  AST 25 32  ALT 30 22  ALKPHOS 104 92  BILITOT 1.0 0.4  PROT 7.7 7.4  ALBUMIN 3.9 3.9   Recent Labs  Lab 03/04/24 1104  LIPASE 14   No results for input(s): AMMONIA in the last 168 hours. CBC: Recent Labs  Lab 03/04/24 1104 03/07/24 0230 03/07/24 0314 03/07/24 0532 03/08/24 0150  WBC 13.2* 8.4  --  7.7 5.6  HGB 13.5 12.4* 13.3 12.2* 11.7*  HCT 41.9 39.3 39.0 37.5* 35.6*  MCV 95.7 96.6  --  95.7 94.2  PLT 311 309  --  290 314   Cardiac Enzymes: No results for input(s): CKTOTAL, CKMB, CKMBINDEX, TROPONINI, TROPONINIHS in the last 168 hours. BNP: No results for input(s): BNP in the last 168 hours. CBG: Recent Labs  Lab 03/07/24 1646 03/07/24 1740 03/07/24 2325 03/08/24 0759 03/08/24 1208  GLUCAP 299* 376* 347* 237* 298*   D-Dimer No results for input(s): DDIMER in the last 72 hours. Hgb A1c Recent Labs    03/08/24 0150  HGBA1C 11.8*   Lipid Profile No results for input(s): CHOL, HDL, LDLCALC, TRIG, CHOLHDL, LDLDIRECT in the last 72 hours. Thyroid  function studies No results for input(s): TSH, T4TOTAL,  FREET4, T3FREE, THYROIDAB in the last 72 hours.  Invalid input(s): FREET3 Anemia work up No results for input(s): VITAMINB12, FOLATE, FERRITIN, TIBC, IRON, RETICCTPCT in the last 72 hours. Urinalysis    Component Value Date/Time   COLORURINE  YELLOW 03/07/2024 1614   APPEARANCEUR CLEAR 03/07/2024 1614   LABSPEC 1.013 03/07/2024 1614   PHURINE 5.0 03/07/2024 1614   GLUCOSEU >=500 (A) 03/07/2024 1614   HGBUR NEGATIVE 03/07/2024 1614   BILIRUBINUR NEGATIVE 03/07/2024 1614   BILIRUBINUR negative 03/26/2022 1531   BILIRUBINUR small 02/16/2019 1618   KETONESUR NEGATIVE 03/07/2024 1614   PROTEINUR NEGATIVE 03/07/2024 1614   UROBILINOGEN 0.2 03/26/2022 1531   UROBILINOGEN 1.0 10/12/2012 0149   NITRITE NEGATIVE 03/07/2024 1614   LEUKOCYTESUR NEGATIVE 03/07/2024 1614   Sepsis Labs Recent Labs  Lab 03/04/24 1104 03/07/24 0230 03/07/24 0532 03/08/24 0150  WBC 13.2* 8.4 7.7 5.6    Procedures/Studies: DG Chest 2 View Result Date: 03/07/2024 CLINICAL DATA:  Shortness of breath.  Hypoxia. EXAM: CHEST - 2 VIEW COMPARISON:  12/02/2023 FINDINGS: The heart size and mediastinal contours are within normal limits. Both lungs are clear. The visualized skeletal structures are unremarkable. IMPRESSION: No active cardiopulmonary disease. Electronically Signed   By: Norleen DELENA Kil M.D.   On: 03/07/2024 05:38    Time coordinating discharge: 45 mins  SIGNED:  Norval Bar, MD [facility] 03/08/2024, 1:38 PM     [1]  Allergies Allergen Reactions   Lisinopril  Anaphylaxis, Swelling and Other (See Comments)    Lip Swelling and angioedema, too    "

## 2024-03-09 ENCOUNTER — Encounter: Payer: Self-pay | Admitting: Critical Care Medicine

## 2024-03-09 ENCOUNTER — Other Ambulatory Visit: Payer: Self-pay

## 2024-03-09 LAB — GLUCOSE, POCT (MANUAL RESULT ENTRY): POC Glucose: 144 mg/dL — AB (ref 70–99)

## 2024-03-09 MED ORDER — ACCU-CHEK GUIDE TEST VI STRP
ORAL_STRIP | 12 refills | Status: AC
  Filled 2024-03-09: qty 100, 25d supply, fill #0

## 2024-03-09 NOTE — Congregational Nurse Program (Signed)
" °  Dept: [phone]   Congregational Nurse Program Note  Date of Encounter: 03/09/2024  Resident recently hospitalized for respiratory infection, states that he is out of glucometer strips and couldn't check blood glucose to take insulin .  Blood glucose 144 AC lunch, advised resident that glucometer strips would be obtained if the ones in the clinic at [facility] couldn't be used with his glucometer Past Medical History: Past Medical History:  Diagnosis Date   Angioedema of lips 07/28/2012   left upper (07/29/2012)   Arthritis    hands and back   Auditory hallucination 01/23/2024   Chronic back pain    Cocaine  abuse (HCC)    Diabetic neuropathy (HCC)    High cholesterol    Hypertension    Neuropathy    Type II diabetes mellitus (HCC)     Encounter Details:  Community Questionnaire - 03/09/24 1010       Questionnaire   Ask client: Do you give verbal consent for me to treat you today? N/A    Student Assistance N/A    Location Patient Served  [facility]    Encounter Setting CN site    Population Status Unhoused    Insurance Medicaid    Insurance/Financial Assistance Referral N/A    Medication Have Medication Insecurities;Provided Medication Assistance    Medical Provider Yes    Screening Referrals Made N/A    Medical Referrals Made N/A    Medical Appointment Completed N/A    CNP Interventions Advocate/Support;Case Management;Counsel    Screenings CN Performed Blood Pressure;Blood Glucose    ED Visit Averted N/A    Life-Saving Intervention Made N/A            "

## 2024-03-13 ENCOUNTER — Emergency Department (HOSPITAL_COMMUNITY): Payer: MEDICAID

## 2024-03-13 ENCOUNTER — Other Ambulatory Visit (HOSPITAL_COMMUNITY): Payer: Self-pay

## 2024-03-13 ENCOUNTER — Inpatient Hospital Stay (HOSPITAL_COMMUNITY)
Admission: EM | Admit: 2024-03-13 | Discharge: 2024-04-12 | DRG: 481 | Disposition: A | Discharge status: Home / Self Care | Payer: MEDICAID | Attending: Internal Medicine | Admitting: Internal Medicine

## 2024-03-13 DIAGNOSIS — S7290XA Unspecified fracture of unspecified femur, initial encounter for closed fracture: Secondary | ICD-10-CM | POA: Diagnosis present

## 2024-03-13 DIAGNOSIS — E871 Hypo-osmolality and hyponatremia: Secondary | ICD-10-CM | POA: Diagnosis not present

## 2024-03-13 DIAGNOSIS — F17210 Nicotine dependence, cigarettes, uncomplicated: Secondary | ICD-10-CM | POA: Diagnosis present

## 2024-03-13 DIAGNOSIS — E1142 Type 2 diabetes mellitus with diabetic polyneuropathy: Secondary | ICD-10-CM | POA: Diagnosis present

## 2024-03-13 DIAGNOSIS — Z833 Family history of diabetes mellitus: Secondary | ICD-10-CM | POA: Diagnosis not present

## 2024-03-13 DIAGNOSIS — Z79899 Other long term (current) drug therapy: Secondary | ICD-10-CM | POA: Diagnosis not present

## 2024-03-13 DIAGNOSIS — M1612 Unilateral primary osteoarthritis, left hip: Secondary | ICD-10-CM | POA: Diagnosis present

## 2024-03-13 DIAGNOSIS — Z5901 Sheltered homelessness: Secondary | ICD-10-CM

## 2024-03-13 DIAGNOSIS — E1165 Type 2 diabetes mellitus with hyperglycemia: Secondary | ICD-10-CM | POA: Diagnosis present

## 2024-03-13 DIAGNOSIS — S72142G Displaced intertrochanteric fracture of left femur, subsequent encounter for closed fracture with delayed healing: Secondary | ICD-10-CM | POA: Diagnosis not present

## 2024-03-13 DIAGNOSIS — G8929 Other chronic pain: Secondary | ICD-10-CM | POA: Diagnosis present

## 2024-03-13 DIAGNOSIS — Z888 Allergy status to other drugs, medicaments and biological substances status: Secondary | ICD-10-CM | POA: Diagnosis not present

## 2024-03-13 DIAGNOSIS — Z5941 Food insecurity: Secondary | ICD-10-CM

## 2024-03-13 DIAGNOSIS — W03XXXA Other fall on same level due to collision with another person, initial encounter: Secondary | ICD-10-CM | POA: Diagnosis present

## 2024-03-13 DIAGNOSIS — Z59868 Other specified financial insecurity: Secondary | ICD-10-CM | POA: Diagnosis not present

## 2024-03-13 DIAGNOSIS — S72002A Fracture of unspecified part of neck of left femur, initial encounter for closed fracture: Secondary | ICD-10-CM | POA: Diagnosis not present

## 2024-03-13 DIAGNOSIS — F1410 Cocaine abuse, uncomplicated: Secondary | ICD-10-CM | POA: Diagnosis present

## 2024-03-13 DIAGNOSIS — E8809 Other disorders of plasma-protein metabolism, not elsewhere classified: Secondary | ICD-10-CM | POA: Diagnosis present

## 2024-03-13 DIAGNOSIS — S72142A Displaced intertrochanteric fracture of left femur, initial encounter for closed fracture: Secondary | ICD-10-CM | POA: Diagnosis present

## 2024-03-13 DIAGNOSIS — Z751 Person awaiting admission to adequate facility elsewhere: Secondary | ICD-10-CM

## 2024-03-13 DIAGNOSIS — D75839 Thrombocytosis, unspecified: Secondary | ICD-10-CM | POA: Diagnosis present

## 2024-03-13 DIAGNOSIS — K5900 Constipation, unspecified: Secondary | ICD-10-CM | POA: Diagnosis not present

## 2024-03-13 DIAGNOSIS — G959 Disease of spinal cord, unspecified: Secondary | ICD-10-CM | POA: Diagnosis not present

## 2024-03-13 DIAGNOSIS — I10 Essential (primary) hypertension: Secondary | ICD-10-CM | POA: Diagnosis present

## 2024-03-13 DIAGNOSIS — Z716 Tobacco abuse counseling: Secondary | ICD-10-CM

## 2024-03-13 DIAGNOSIS — R339 Retention of urine, unspecified: Secondary | ICD-10-CM | POA: Diagnosis not present

## 2024-03-13 DIAGNOSIS — S72001D Fracture of unspecified part of neck of right femur, subsequent encounter for closed fracture with routine healing: Secondary | ICD-10-CM | POA: Diagnosis not present

## 2024-03-13 DIAGNOSIS — Z5989 Other problems related to housing and economic circumstances: Secondary | ICD-10-CM

## 2024-03-13 DIAGNOSIS — Z87891 Personal history of nicotine dependence: Secondary | ICD-10-CM | POA: Diagnosis not present

## 2024-03-13 DIAGNOSIS — M7989 Other specified soft tissue disorders: Secondary | ICD-10-CM | POA: Diagnosis not present

## 2024-03-13 DIAGNOSIS — Z794 Long term (current) use of insulin: Secondary | ICD-10-CM

## 2024-03-13 DIAGNOSIS — E7800 Pure hypercholesterolemia, unspecified: Secondary | ICD-10-CM | POA: Diagnosis present

## 2024-03-13 DIAGNOSIS — Z5948 Other specified lack of adequate food: Secondary | ICD-10-CM

## 2024-03-13 DIAGNOSIS — Z7984 Long term (current) use of oral hypoglycemic drugs: Secondary | ICD-10-CM

## 2024-03-13 DIAGNOSIS — Z7151 Drug abuse counseling and surveillance of drug abuser: Secondary | ICD-10-CM

## 2024-03-13 DIAGNOSIS — S72001F Fracture of unspecified part of neck of right femur, subsequent encounter for open fracture type IIIA, IIIB, or IIIC with routine healing: Secondary | ICD-10-CM | POA: Diagnosis not present

## 2024-03-13 DIAGNOSIS — Z7982 Long term (current) use of aspirin: Secondary | ICD-10-CM | POA: Diagnosis not present

## 2024-03-13 DIAGNOSIS — E11649 Type 2 diabetes mellitus with hypoglycemia without coma: Secondary | ICD-10-CM | POA: Diagnosis not present

## 2024-03-13 DIAGNOSIS — S72142B Displaced intertrochanteric fracture of left femur, initial encounter for open fracture type I or II: Principal | ICD-10-CM

## 2024-03-13 DIAGNOSIS — F32A Depression, unspecified: Secondary | ICD-10-CM | POA: Diagnosis present

## 2024-03-13 DIAGNOSIS — Z5982 Transportation insecurity: Secondary | ICD-10-CM

## 2024-03-13 LAB — CBC WITH DIFFERENTIAL/PLATELET
Abs Immature Granulocytes: 0.1 K/uL — ABNORMAL HIGH (ref 0.00–0.07)
Basophils Absolute: 0.1 K/uL (ref 0.0–0.1)
Basophils Relative: 0 %
Eosinophils Absolute: 0.1 K/uL (ref 0.0–0.5)
Eosinophils Relative: 1 %
HCT: 39.1 % (ref 39.0–52.0)
Hemoglobin: 12.7 g/dL — ABNORMAL LOW (ref 13.0–17.0)
Immature Granulocytes: 1 %
Lymphocytes Relative: 11 %
Lymphs Abs: 1.6 K/uL (ref 0.7–4.0)
MCH: 30.9 pg (ref 26.0–34.0)
MCHC: 32.5 g/dL (ref 30.0–36.0)
MCV: 95.1 fL (ref 80.0–100.0)
Monocytes Absolute: 0.6 K/uL (ref 0.1–1.0)
Monocytes Relative: 5 %
Neutro Abs: 11.6 K/uL — ABNORMAL HIGH (ref 1.7–7.7)
Neutrophils Relative %: 82 %
Platelets: 487 K/uL — ABNORMAL HIGH (ref 150–400)
RBC: 4.11 MIL/uL — ABNORMAL LOW (ref 4.22–5.81)
RDW: 13.7 % (ref 11.5–15.5)
WBC: 14.1 K/uL — ABNORMAL HIGH (ref 4.0–10.5)
nRBC: 0 % (ref 0.0–0.2)

## 2024-03-13 LAB — BASIC METABOLIC PANEL WITH GFR
Anion gap: 12 (ref 5–15)
BUN: 16 mg/dL (ref 8–23)
CO2: 22 mmol/L (ref 22–32)
Calcium: 9.3 mg/dL (ref 8.9–10.3)
Chloride: 101 mmol/L (ref 98–111)
Creatinine, Ser: 1.09 mg/dL (ref 0.61–1.24)
GFR, Estimated: >60 mL/min
Glucose, Bld: 188 mg/dL — ABNORMAL HIGH (ref 70–99)
Potassium: 4.5 mmol/L (ref 3.5–5.1)
Sodium: 135 mmol/L (ref 135–145)

## 2024-03-13 LAB — CBG MONITORING, ED: Glucose-Capillary: 170 mg/dL — ABNORMAL HIGH (ref 70–99)

## 2024-03-13 MED ORDER — ISOSORBIDE MONONITRATE ER 30 MG PO TB24
30.0000 mg | ORAL_TABLET | Freq: Every day | ORAL | Status: DC
  Administered 2024-03-15 – 2024-04-12 (×29): 30 mg via ORAL
  Filled 2024-03-13 (×30): qty 1

## 2024-03-13 MED ORDER — GABAPENTIN 300 MG PO CAPS
600.0000 mg | ORAL_CAPSULE | Freq: Three times a day (TID) | ORAL | Status: DC
  Administered 2024-03-13 – 2024-03-24 (×31): 600 mg via ORAL
  Filled 2024-03-13 (×32): qty 2

## 2024-03-13 MED ORDER — ACETAMINOPHEN 325 MG PO TABS
650.0000 mg | ORAL_TABLET | Freq: Four times a day (QID) | ORAL | Status: DC | PRN
  Administered 2024-03-16 – 2024-04-11 (×11): 650 mg via ORAL
  Filled 2024-03-13 (×12): qty 2

## 2024-03-13 MED ORDER — ONDANSETRON HCL 4 MG/2ML IJ SOLN
4.0000 mg | Freq: Four times a day (QID) | INTRAMUSCULAR | Status: DC | PRN
  Administered 2024-03-14: 4 mg via INTRAVENOUS
  Filled 2024-03-13: qty 2

## 2024-03-13 MED ORDER — HYDRALAZINE HCL 20 MG/ML IJ SOLN: 10.0000 mg | Freq: Four times a day (QID) | INTRAMUSCULAR | Status: DC | PRN

## 2024-03-13 MED ORDER — CARVEDILOL 3.125 MG PO TABS: 3.1250 mg | ORAL_TABLET | Freq: Two times a day (BID) | ORAL | Status: DC

## 2024-03-13 MED ORDER — INSULIN ASPART 100 UNIT/ML IJ SOLN
0.0000 [IU] | Freq: Three times a day (TID) | INTRAMUSCULAR | Status: DC
  Administered 2024-03-14 (×2): 3 [IU] via SUBCUTANEOUS
  Administered 2024-03-14 – 2024-03-15 (×2): 5 [IU] via SUBCUTANEOUS
  Administered 2024-03-15: 3 [IU] via SUBCUTANEOUS
  Administered 2024-03-15: 15 [IU] via SUBCUTANEOUS
  Administered 2024-03-16: 11 [IU] via SUBCUTANEOUS
  Administered 2024-03-16: 5 [IU] via SUBCUTANEOUS
  Administered 2024-03-16: 8 [IU] via SUBCUTANEOUS
  Administered 2024-03-17: 11 [IU] via SUBCUTANEOUS
  Administered 2024-03-17: 8 [IU] via SUBCUTANEOUS
  Administered 2024-03-17: 3 [IU] via SUBCUTANEOUS
  Administered 2024-03-18 (×3): 5 [IU] via SUBCUTANEOUS
  Administered 2024-03-19 (×2): 8 [IU] via SUBCUTANEOUS
  Administered 2024-03-20: 2 [IU] via SUBCUTANEOUS
  Administered 2024-03-20: 3 [IU] via SUBCUTANEOUS
  Administered 2024-03-20: 8 [IU] via SUBCUTANEOUS
  Administered 2024-03-21: 3 [IU] via SUBCUTANEOUS
  Administered 2024-03-21: 2 [IU] via SUBCUTANEOUS
  Administered 2024-03-21: 8 [IU] via SUBCUTANEOUS
  Administered 2024-03-22 (×2): 2 [IU] via SUBCUTANEOUS
  Administered 2024-03-22 – 2024-03-23 (×2): 3 [IU] via SUBCUTANEOUS
  Administered 2024-03-23: 11 [IU] via SUBCUTANEOUS
  Administered 2024-03-24: 5 [IU] via SUBCUTANEOUS
  Administered 2024-03-24: 3 [IU] via SUBCUTANEOUS
  Administered 2024-03-24: 2 [IU] via SUBCUTANEOUS
  Administered 2024-03-25: 5 [IU] via SUBCUTANEOUS
  Administered 2024-03-25 – 2024-03-26 (×3): 2 [IU] via SUBCUTANEOUS
  Administered 2024-03-26 – 2024-03-27 (×2): 5 [IU] via SUBCUTANEOUS
  Administered 2024-03-27: 3 [IU] via SUBCUTANEOUS
  Administered 2024-03-27: 2 [IU] via SUBCUTANEOUS
  Administered 2024-03-28 (×2): 3 [IU] via SUBCUTANEOUS
  Administered 2024-03-29: 2 [IU] via SUBCUTANEOUS
  Administered 2024-03-29: 3 [IU] via SUBCUTANEOUS
  Administered 2024-03-29: 5 [IU] via SUBCUTANEOUS
  Administered 2024-03-30 – 2024-03-31 (×3): 2 [IU] via SUBCUTANEOUS
  Administered 2024-03-31: 3 [IU] via SUBCUTANEOUS
  Administered 2024-03-31 – 2024-04-01 (×2): 2 [IU] via SUBCUTANEOUS
  Administered 2024-04-02: 5 [IU] via SUBCUTANEOUS
  Administered 2024-04-02: 2 [IU] via SUBCUTANEOUS
  Administered 2024-04-03: 3 [IU] via SUBCUTANEOUS
  Administered 2024-04-03: 2 [IU] via SUBCUTANEOUS
  Administered 2024-04-04: 3 [IU] via SUBCUTANEOUS
  Administered 2024-04-04: 2 [IU] via SUBCUTANEOUS
  Administered 2024-04-05: 3 [IU] via SUBCUTANEOUS
  Filled 2024-03-13: qty 15
  Filled 2024-03-13: qty 11
  Filled 2024-03-13: qty 5
  Filled 2024-03-13: qty 8
  Filled 2024-03-13: qty 3
  Filled 2024-03-13: qty 2
  Filled 2024-03-13: qty 5
  Filled 2024-03-13 (×2): qty 2
  Filled 2024-03-13: qty 3
  Filled 2024-03-13 (×2): qty 2
  Filled 2024-03-13: qty 3
  Filled 2024-03-13 (×2): qty 2
  Filled 2024-03-13: qty 10
  Filled 2024-03-13: qty 2
  Filled 2024-03-13: qty 11
  Filled 2024-03-13: qty 2
  Filled 2024-03-13: qty 5
  Filled 2024-03-13: qty 3
  Filled 2024-03-13 (×2): qty 2
  Filled 2024-03-13: qty 5
  Filled 2024-03-13: qty 3
  Filled 2024-03-13: qty 8
  Filled 2024-03-13 (×2): qty 3
  Filled 2024-03-13: qty 2
  Filled 2024-03-13: qty 11
  Filled 2024-03-13: qty 5
  Filled 2024-03-13: qty 3
  Filled 2024-03-13: qty 2
  Filled 2024-03-13: qty 3
  Filled 2024-03-13: qty 12
  Filled 2024-03-13: qty 3
  Filled 2024-03-13: qty 2
  Filled 2024-03-13 (×2): qty 3
  Filled 2024-03-13: qty 2
  Filled 2024-03-13 (×2): qty 5
  Filled 2024-03-13: qty 2
  Filled 2024-03-13: qty 5
  Filled 2024-03-13: qty 7
  Filled 2024-03-13: qty 2
  Filled 2024-03-13: qty 3

## 2024-03-13 MED ORDER — ATORVASTATIN CALCIUM 40 MG PO TABS
40.0000 mg | ORAL_TABLET | Freq: Every day | ORAL | Status: DC
  Administered 2024-03-15 – 2024-04-12 (×29): 40 mg via ORAL
  Filled 2024-03-13 (×30): qty 1

## 2024-03-13 MED ORDER — OXYCODONE HCL 5 MG PO TABS: 5.0000 mg | ORAL_TABLET | ORAL | Status: DC | PRN

## 2024-03-13 MED ORDER — FENTANYL CITRATE (PF) 50 MCG/ML IJ SOSY
50.0000 ug | PREFILLED_SYRINGE | Freq: Once | INTRAMUSCULAR | Status: AC
  Administered 2024-03-13: 50 ug via INTRAVENOUS
  Filled 2024-03-13: qty 1

## 2024-03-13 MED ORDER — AMLODIPINE BESYLATE 10 MG PO TABS
10.0000 mg | ORAL_TABLET | Freq: Every day | ORAL | Status: DC
  Administered 2024-03-15 – 2024-04-12 (×29): 10 mg via ORAL
  Filled 2024-03-13 (×30): qty 1

## 2024-03-13 MED ORDER — OXYCODONE-ACETAMINOPHEN 5-325 MG PO TABS
1.0000 | ORAL_TABLET | Freq: Once | ORAL | Status: DC
  Administered 2024-03-13: 1 via ORAL
  Filled 2024-03-13: qty 1

## 2024-03-13 MED ORDER — HYDROMORPHONE HCL 1 MG/ML IJ SOLN
0.5000 mg | INTRAMUSCULAR | Status: DC | PRN
  Administered 2024-03-14 – 2024-03-16 (×9): 1 mg via INTRAVENOUS
  Filled 2024-03-13 (×9): qty 1

## 2024-03-13 MED ORDER — ACETAMINOPHEN 650 MG RE SUPP: 650.0000 mg | Freq: Four times a day (QID) | RECTAL | Status: DC | PRN

## 2024-03-13 MED ORDER — ONDANSETRON HCL 4 MG PO TABS: 4.0000 mg | ORAL_TABLET | Freq: Four times a day (QID) | ORAL | Status: DC | PRN

## 2024-03-13 MED ORDER — INSULIN ASPART 100 UNIT/ML IJ SOLN
0.0000 [IU] | Freq: Every day | INTRAMUSCULAR | Status: DC
  Administered 2024-03-14: 2 [IU] via SUBCUTANEOUS
  Filled 2024-03-13: qty 2

## 2024-03-13 MED ORDER — DULOXETINE HCL 60 MG PO CPEP
60.0000 mg | ORAL_CAPSULE | Freq: Every evening | ORAL | Status: DC
  Administered 2024-03-14 – 2024-04-11 (×29): 60 mg via ORAL
  Filled 2024-03-13 (×29): qty 1
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
## 2024-03-13 NOTE — ED Notes (Signed)
 Pt refused vitals signs. States he is in to much pain and started hitting the wall
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
## 2024-03-13 NOTE — ED Triage Notes (Signed)
 Pt is a&o x 4. Did not hit head, not on blood thinners. No loc

## 2024-03-13 NOTE — ED Triage Notes (Signed)
 Pt yelling and refusing to answer questions due to pain.

## 2024-03-14 ENCOUNTER — Inpatient Hospital Stay (HOSPITAL_COMMUNITY): Payer: MEDICAID | Admitting: Registered Nurse

## 2024-03-14 ENCOUNTER — Inpatient Hospital Stay (HOSPITAL_COMMUNITY): Payer: MEDICAID

## 2024-03-14 ENCOUNTER — Encounter (HOSPITAL_COMMUNITY): Payer: Self-pay | Admitting: Internal Medicine

## 2024-03-14 ENCOUNTER — Other Ambulatory Visit: Payer: Self-pay

## 2024-03-14 ENCOUNTER — Encounter (HOSPITAL_COMMUNITY)
Admission: EM | Disposition: A | Discharge status: Home / Self Care | Payer: Self-pay | Source: Home / Self Care | Attending: Internal Medicine

## 2024-03-14 DIAGNOSIS — I10 Essential (primary) hypertension: Secondary | ICD-10-CM

## 2024-03-14 DIAGNOSIS — S72002A Fracture of unspecified part of neck of left femur, initial encounter for closed fracture: Secondary | ICD-10-CM

## 2024-03-14 DIAGNOSIS — S72002S Fracture of unspecified part of neck of left femur, sequela: Secondary | ICD-10-CM

## 2024-03-14 DIAGNOSIS — F32A Depression, unspecified: Secondary | ICD-10-CM

## 2024-03-14 DIAGNOSIS — Z87891 Personal history of nicotine dependence: Secondary | ICD-10-CM

## 2024-03-14 DIAGNOSIS — S72142A Displaced intertrochanteric fracture of left femur, initial encounter for closed fracture: Secondary | ICD-10-CM

## 2024-03-14 DIAGNOSIS — S72001D Fracture of unspecified part of neck of right femur, subsequent encounter for closed fracture with routine healing: Secondary | ICD-10-CM | POA: Diagnosis not present

## 2024-03-14 HISTORY — PX: INTRAMEDULLARY (IM) NAIL INTERTROCHANTERIC: SHX5875

## 2024-03-14 LAB — CBC
HCT: 39.5 % (ref 39.0–52.0)
Hemoglobin: 12.9 g/dL — ABNORMAL LOW (ref 13.0–17.0)
MCH: 30.5 pg (ref 26.0–34.0)
MCHC: 32.7 g/dL (ref 30.0–36.0)
MCV: 93.4 fL (ref 80.0–100.0)
Platelets: 485 K/uL — ABNORMAL HIGH (ref 150–400)
RBC: 4.23 MIL/uL (ref 4.22–5.81)
RDW: 13.8 % (ref 11.5–15.5)
WBC: 13.5 K/uL — ABNORMAL HIGH (ref 4.0–10.5)
nRBC: 0 % (ref 0.0–0.2)

## 2024-03-14 LAB — GLUCOSE, CAPILLARY
Glucose-Capillary: 184 mg/dL — ABNORMAL HIGH (ref 70–99)
Glucose-Capillary: 174 mg/dL — ABNORMAL HIGH (ref 70–99)
Glucose-Capillary: 193 mg/dL — ABNORMAL HIGH (ref 70–99)
Glucose-Capillary: 208 mg/dL — ABNORMAL HIGH (ref 70–99)
Glucose-Capillary: 174 mg/dL — ABNORMAL HIGH (ref 70–99)
Glucose-Capillary: 237 mg/dL — ABNORMAL HIGH (ref 70–99)

## 2024-03-14 LAB — BASIC METABOLIC PANEL WITH GFR
Anion gap: 12 (ref 5–15)
BUN: 14 mg/dL (ref 8–23)
CO2: 26 mmol/L (ref 22–32)
Calcium: 9.1 mg/dL (ref 8.9–10.3)
Chloride: 101 mmol/L (ref 98–111)
Creatinine, Ser: 1.1 mg/dL (ref 0.61–1.24)
GFR, Estimated: >60 mL/min
Glucose, Bld: 235 mg/dL — ABNORMAL HIGH (ref 70–99)
Potassium: 4.7 mmol/L (ref 3.5–5.1)
Sodium: 139 mmol/L (ref 135–145)

## 2024-03-14 MED ORDER — PROPOFOL 10 MG/ML IV BOLUS
INTRAVENOUS | Status: DC | PRN
  Administered 2024-03-14: 150 mg via INTRAVENOUS

## 2024-03-14 MED ORDER — STERILE WATER FOR IRRIGATION IR SOLN
Status: DC | PRN
  Administered 2024-03-14: 1000 mL

## 2024-03-14 MED ORDER — GLUCAGON HCL RDNA (DIAGNOSTIC) 1 MG IJ SOLR: 1.0000 mg | INTRAMUSCULAR | Status: DC | PRN

## 2024-03-14 MED ORDER — HYDROCODONE-ACETAMINOPHEN 5-325 MG PO TABS
1.0000 | ORAL_TABLET | ORAL | Status: DC | PRN
  Administered 2024-03-14 – 2024-03-16 (×6): 2 via ORAL
  Filled 2024-03-14 (×6): qty 2

## 2024-03-14 MED ORDER — FENTANYL CITRATE (PF) 100 MCG/2ML IJ SOLN
INTRAMUSCULAR | Status: AC
  Filled 2024-03-14: qty 2

## 2024-03-14 MED ORDER — SUGAMMADEX SODIUM 200 MG/2ML IV SOLN
INTRAVENOUS | Status: DC | PRN
  Administered 2024-03-14: 200 mg via INTRAVENOUS

## 2024-03-14 MED ORDER — BACITRACIN ZINC 500 UNIT/GM EX OINT
TOPICAL_OINTMENT | CUTANEOUS | Status: AC
  Filled 2024-03-14: qty 28.35

## 2024-03-14 MED ORDER — HYDROCODONE-ACETAMINOPHEN 5-325 MG PO TABS: 1.0000 | ORAL_TABLET | ORAL | 0 refills | Status: DC | PRN

## 2024-03-14 MED ORDER — PHENYLEPHRINE 80 MCG/ML (10ML) SYRINGE FOR IV PUSH (FOR BLOOD PRESSURE SUPPORT)
PREFILLED_SYRINGE | INTRAVENOUS | Status: AC
  Filled 2024-03-14: qty 10

## 2024-03-14 MED ORDER — ORAL CARE MOUTH RINSE: 15.0000 mL | Freq: Once | OROMUCOSAL | Status: AC

## 2024-03-14 MED ORDER — HYDRALAZINE HCL 20 MG/ML IJ SOLN
INTRAMUSCULAR | Status: AC
  Filled 2024-03-14: qty 1

## 2024-03-14 MED ORDER — CHLORHEXIDINE GLUCONATE 0.12 % MT SOLN
OROMUCOSAL | Status: AC
  Filled 2024-03-14: qty 15

## 2024-03-14 MED ORDER — MIDAZOLAM HCL 2 MG/2ML IJ SOLN
INTRAMUSCULAR | Status: AC
  Filled 2024-03-14: qty 2

## 2024-03-14 MED ORDER — IPRATROPIUM-ALBUTEROL 0.5-2.5 (3) MG/3ML IN SOLN: 3.0000 mL | RESPIRATORY_TRACT | Status: DC | PRN

## 2024-03-14 MED ORDER — MIDAZOLAM HCL 5 MG/5ML IJ SOLN
INTRAMUSCULAR | Status: DC | PRN
  Administered 2024-03-14: 2 mg via INTRAVENOUS

## 2024-03-14 MED ORDER — SENNOSIDES-DOCUSATE SODIUM 8.6-50 MG PO TABS
1.0000 | ORAL_TABLET | Freq: Every evening | ORAL | Status: DC | PRN
  Administered 2024-03-15: 1 via ORAL
  Filled 2024-03-14: qty 1

## 2024-03-14 MED ORDER — DEXMEDETOMIDINE HCL IN NACL 80 MCG/20ML IV SOLN
INTRAVENOUS | Status: DC | PRN
  Administered 2024-03-14: 8 ug via INTRAVENOUS
  Administered 2024-03-14: 4 ug via INTRAVENOUS

## 2024-03-14 MED ORDER — CEFAZOLIN SODIUM-DEXTROSE 1-4 GM/50ML-% IV SOLN
INTRAVENOUS | Status: AC
  Filled 2024-03-14: qty 50

## 2024-03-14 MED ORDER — HYDRALAZINE HCL 20 MG/ML IJ SOLN
5.0000 mg | Freq: Once | INTRAMUSCULAR | Status: AC
  Administered 2024-03-14: 5 mg via INTRAVENOUS

## 2024-03-14 MED ORDER — LIDOCAINE 2% (20 MG/ML) 5 ML SYRINGE
INTRAMUSCULAR | Status: DC | PRN
  Administered 2024-03-14: 60 mg via INTRAVENOUS

## 2024-03-14 MED ORDER — LIDOCAINE 2% (20 MG/ML) 5 ML SYRINGE
INTRAMUSCULAR | Status: AC
  Filled 2024-03-14: qty 5

## 2024-03-14 MED ORDER — METOPROLOL TARTRATE 5 MG/5ML IV SOLN: 5.0000 mg | INTRAVENOUS | Status: DC | PRN

## 2024-03-14 MED ORDER — PROPOFOL 10 MG/ML IV BOLUS
INTRAVENOUS | Status: AC
  Filled 2024-03-14: qty 20

## 2024-03-14 MED ORDER — ROCURONIUM BROMIDE 10 MG/ML (PF) SYRINGE
PREFILLED_SYRINGE | INTRAVENOUS | Status: DC | PRN
  Administered 2024-03-14: 20 mg via INTRAVENOUS
  Administered 2024-03-14: 60 mg via INTRAVENOUS
  Administered 2024-03-14: 10 mg via INTRAVENOUS

## 2024-03-14 MED ORDER — 0.9 % SODIUM CHLORIDE (POUR BTL) OPTIME
TOPICAL | Status: DC | PRN
  Administered 2024-03-14: 1000 mL

## 2024-03-14 MED ORDER — LACTATED RINGERS IV SOLN: INTRAVENOUS | Status: DC

## 2024-03-14 MED ORDER — CHLORHEXIDINE GLUCONATE 0.12 % MT SOLN
15.0000 mL | Freq: Once | OROMUCOSAL | Status: AC
  Administered 2024-03-14: 15 mL via OROMUCOSAL

## 2024-03-14 MED ORDER — CEFAZOLIN SODIUM-DEXTROSE 2-3 GM-%(50ML) IV SOLR
INTRAVENOUS | Status: DC | PRN
  Administered 2024-03-14: 2 g via INTRAVENOUS

## 2024-03-14 MED ORDER — FENTANYL CITRATE (PF) 250 MCG/5ML IJ SOLN
INTRAMUSCULAR | Status: DC | PRN
  Administered 2024-03-14 (×2): 50 ug via INTRAVENOUS
  Administered 2024-03-14: 100 ug via INTRAVENOUS

## 2024-03-14 MED ORDER — CEFAZOLIN SODIUM 1 G IJ SOLR
INTRAMUSCULAR | Status: AC
  Filled 2024-03-14: qty 10

## 2024-03-14 MED ORDER — GUAIFENESIN 100 MG/5ML PO LIQD
5.0000 mL | ORAL | Status: DC | PRN
  Administered 2024-03-14: 5 mL via ORAL
  Filled 2024-03-14: qty 10

## 2024-03-14 MED ORDER — PHENYLEPHRINE 80 MCG/ML (10ML) SYRINGE FOR IV PUSH (FOR BLOOD PRESSURE SUPPORT)
PREFILLED_SYRINGE | INTRAVENOUS | Status: DC | PRN
  Administered 2024-03-14 (×2): 80 ug via INTRAVENOUS
  Administered 2024-03-14: 160 ug via INTRAVENOUS
  Administered 2024-03-14: 80 ug via INTRAVENOUS

## 2024-03-14 MED ORDER — ROCURONIUM BROMIDE 10 MG/ML (PF) SYRINGE
PREFILLED_SYRINGE | INTRAVENOUS | Status: AC
  Filled 2024-03-14: qty 10

## 2024-03-14 MED ORDER — DEXMEDETOMIDINE HCL IN NACL 80 MCG/20ML IV SOLN
INTRAVENOUS | Status: AC
  Filled 2024-03-14: qty 20

## 2024-03-14 NOTE — Progress Notes (Signed)
 Patient uneventfully had a closed reduction and intramedullary fixation of his left intertrochanteric fracture.  Patient can weight-bear as tolerated.  A prescription for Norco will be placed in his chart.  I do recommend 81 mg of aspirin  twice per day for the next 6 weeks.  My office will reach out to the patient for follow-up, which will be on 04/01/2023.

## 2024-03-14 NOTE — Transfer of Care (Signed)
 Immediate Anesthesia Transfer of Care Note  Patient: David Wyatt  Procedure(s) Performed: FIXATION, FRACTURE, INTERTROCHANTERIC, WITH INTRAMEDULLARY ROD (Left)  Patient Location: PACU  Anesthesia Type:General  Level of Consciousness: drowsy and patient cooperative  Airway & Oxygen Therapy: Patient Spontanous Breathing  Post-op Assessment: Report given to RN, Post -op Vital signs reviewed and stable, and Patient moving all extremities X 4  Post vital signs: Reviewed and stable  Last Vitals:  Vitals Value Taken Time  BP 177/104 1000  Temp 98.1 0953  Pulse 98 03/14/24 09:53  Resp 22 03/14/24 09:53  SpO2 93% 03/14/24 09:53  Vitals shown include unfiled device data.  Last Pain:  Vitals:   03/14/24 0644  TempSrc: Oral  PainSc:          Complications: There were no known notable events for this encounter.
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
## 2024-03-14 NOTE — Anesthesia Preprocedure Evaluation (Addendum)
"                                    Anesthesia Evaluation  Patient identified by MRN, date of birth, ID band Patient awake    Reviewed: Allergy & Precautions, H&P , NPO status , Patient's Chart, lab work & pertinent test results  Airway Mallampati: II  TM Distance: >3 FB Neck ROM: Full    Dental no notable dental hx. (+) Edentulous Upper, Edentulous Lower, Dental Advisory Given   Pulmonary Patient abstained from smoking., former smoker   Pulmonary exam normal breath sounds clear to auscultation       Cardiovascular hypertension, Pt. on medications and Pt. on home beta blockers  Rhythm:Regular Rate:Normal     Neuro/Psych    Depression    negative neurological ROS     GI/Hepatic Neg liver ROS,GERD  ,,  Endo/Other  diabetes, Insulin  Dependent    Renal/GU negative Renal ROS  negative genitourinary   Musculoskeletal  (+) Arthritis , Osteoarthritis,    Abdominal   Peds  Hematology negative hematology ROS (+)   Anesthesia Other Findings   Reproductive/Obstetrics negative OB ROS                              Anesthesia Physical Anesthesia Plan  ASA: 3  Anesthesia Plan: General   Post-op Pain Management: Tylenol  PO (pre-op)*   Induction: Intravenous  PONV Risk Score and Plan: 3 and Ondansetron , Dexamethasone  and Midazolam   Airway Management Planned: Oral ETT  Additional Equipment:   Intra-op Plan:   Post-operative Plan: Extubation in OR  Informed Consent: I have reviewed the patients History and Physical, chart, labs and discussed the procedure including the risks, benefits and alternatives for the proposed anesthesia with the patient or authorized representative who has indicated his/her understanding and acceptance.     Dental advisory given  Plan Discussed with: CRNA  Anesthesia Plan Comments:          Anesthesia Quick Evaluation  "

## 2024-03-14 NOTE — Progress Notes (Signed)
 " PROGRESS NOTE    David Wyatt  FMW:[id number] DOB: February 24, 1961 DOA: 03/13/2024 PCP: Delbert Clam, MD    Brief Narrative:   64 year old with history of uncontrolled DM2, HTN, HLD, cocaine  use, diabetic neuropathy, chronic back pain, arthritis comes to the ED after a fall leading to left hip pain.  X-ray showed left-sided hip fracture, orthopedic consulted.  Left hip closed reduction/IM fixation of left hip 1/4.  Postop management including wound care, pain control, weightbearing, follow-up per their service.    Assessment & Plan:   Left femoral intertrochanteric fracture: - Left hip closed reduction/IM fixation of left hip 1/4.  Postop management including wound care, pain control, weightbearing, follow-up per their service.   Leukocytosis Thrombocytosis:  Likely reactive.  He is afebrile.  Will continue to monitor  Urinary retention - Foley catheter placed   Uncontrolled insulin -dependent type 2 diabetes Diabetic peripheral neuropathy - Recent A1c of 11.8.  Start sliding scale insulin .  Monitor blood sugar closely   Depression  On duloxetine     History of cocaine  use  Recommend cessation     Essential hypertension -Slowly resume home p.o. medications when able to tolerate p.o.  IV as needed   Hyperlipidemia  Continue statin   DVT prophylaxis: SCDs Start: 03/13/24 2315    Code Status: Full Code Family Communication:   Status is: Inpatient Remains inpatient appropriate because: Operative management   PT Follow up Recs:   Subjective: Seen postop.  Surgery when okay per orthopedic. Postop having some urinary retention therefore Foley catheter placed   Examination:  General exam: Appears calm and comfortable  Respiratory system: Clear to auscultation. Respiratory effort normal. Cardiovascular system: S1 & S2 heard, RRR. No JVD, murmurs, rubs, gallops or clicks. No pedal edema. Gastrointestinal system: Abdomen is nondistended, soft and nontender. No  organomegaly or masses felt. Normal bowel sounds heard. Central nervous system: Alert and oriented. No focal neurological deficits. Extremities: Symmetric 5 x 5 power. Skin: No rashes, lesions or ulcers Psychiatry: Judgement and insight appear normal. Mood & affect appropriate.                Diet Orders (From admission, onward)     Start     Ordered   03/13/24 2316  Diet NPO time specified  Diet effective now        03/13/24 2316            Objective: Vitals:   03/14/24 1000 03/14/24 1009 03/14/24 1015 03/14/24 1030  BP: (!) 177/104 (!) 205/94 (!) 177/91 (!) 169/96  Pulse: 86  79 74  Resp: 16  15 17   Temp:    98.1 F (36.7 C)  TempSrc:      SpO2: 95%  100% 100%    Intake/Output Summary (Last 24 hours) at 03/14/2024 1047 Last data filed at 03/14/2024 1043 Gross per 24 hour  Intake 1050 ml  Output 900 ml  Net 150 ml   There were no vitals filed for this visit.  Scheduled Meds:  [MAR Hold] amLODipine   10 mg Oral Daily   [MAR Hold] atorvastatin   40 mg Oral Daily   [MAR Hold] DULoxetine   60 mg Oral QPM   [MAR Hold] gabapentin   600 mg Oral TID   [MAR Hold] insulin  aspart  0-15 Units Subcutaneous TID WC   [MAR Hold] insulin  aspart  0-5 Units Subcutaneous QHS   [MAR Hold] isosorbide  mononitrate  30 mg Oral Daily   Continuous Infusions:  lactated ringers  10 mL/hr at 03/14/24 [phone]  Nutritional status     There is no height or weight on file to calculate BMI.  Data Reviewed:   CBC: Recent Labs  Lab 03/08/24 0150 03/13/24 2207  WBC 5.6 14.1*  NEUTROABS  --  11.6*  HGB 11.7* 12.7*  HCT 35.6* 39.1  MCV 94.2 95.1  PLT 314 487*   Basic Metabolic Panel: Recent Labs  Lab 03/08/24 0150 03/13/24 2207  NA 134* 135  K 5.0 4.5  CL 98 101  CO2 25 22  GLUCOSE 360* 188*  BUN 20 16  CREATININE 1.28* 1.09  CALCIUM  8.6* 9.3  MG 1.5*  --   PHOS 2.9  --    GFR: Estimated Creatinine Clearance: 65.9 mL/min (by C-G formula based on SCr of 1.09  mg/dL). Liver Function Tests: No results for input(s): AST, ALT, ALKPHOS, BILITOT, PROT, ALBUMIN in the last 168 hours. No results for input(s): LIPASE, AMYLASE in the last 168 hours. No results for input(s): AMMONIA in the last 168 hours. Coagulation Profile: Recent Labs  Lab 03/08/24 0150  INR 1.0   Cardiac Enzymes: No results for input(s): CKTOTAL, CKMB, CKMBINDEX, TROPONINI in the last 168 hours. BNP (last 3 results) No results for input(s): PROBNP in the last 8760 hours. HbA1C: No results for input(s): HGBA1C in the last 72 hours. CBG: Recent Labs  Lab 03/08/24 1208 03/13/24 2329 03/14/24 0607 03/14/24 0807 03/14/24 0952  GLUCAP 298* 170* 184* 174* 193*   Lipid Profile: No results for input(s): CHOL, HDL, LDLCALC, TRIG, CHOLHDL, LDLDIRECT in the last 72 hours. Thyroid  Function Tests: No results for input(s): TSH, T4TOTAL, FREET4, T3FREE, THYROIDAB in the last 72 hours. Anemia Panel: No results for input(s): VITAMINB12, FOLATE, FERRITIN, TIBC, IRON, RETICCTPCT in the last 72 hours. Sepsis Labs: No results for input(s): PROCALCITON, LATICACIDVEN in the last 168 hours.  Recent Results (from the past 240 hours)  Resp panel by RT-PCR (RSV, Flu A&B, Covid) Anterior Nasal Swab     Status: Abnormal   Collection Time: 03/07/24  3:35 AM   Specimen: Anterior Nasal Swab  Result Value Ref Range Status   SARS Coronavirus 2 by RT PCR NEGATIVE NEGATIVE Final   Influenza A by PCR POSITIVE (A) NEGATIVE Final   Influenza B by PCR NEGATIVE NEGATIVE Final    Comment: (NOTE) The Xpert Xpress SARS-CoV-2/FLU/RSV plus assay is intended as an aid in the diagnosis of influenza from Nasopharyngeal swab specimens and should not be used as a sole basis for treatment. Nasal washings and aspirates are unacceptable for Xpert Xpress SARS-CoV-2/FLU/RSV testing.  Fact Sheet for  Patients: bloggercourse.com  Fact Sheet for Healthcare Providers: seriousbroker.it  This test is not yet approved or cleared by the [place]  FDA and has been authorized for detection and/or diagnosis of SARS-CoV-2 by FDA under an Emergency Use Authorization (EUA). This EUA will remain in effect (meaning this test can be used) for the duration of the COVID-19 declaration under Section 564(b)(1) of the Act, 21 U.S.C. section 360bbb-3(b)(1), unless the authorization is terminated or revoked.     Resp Syncytial Virus by PCR NEGATIVE NEGATIVE Final    Comment: (NOTE) Fact Sheet for Patients: bloggercourse.com  Fact Sheet for Healthcare Providers: seriousbroker.it  This test is not yet approved or cleared by the [place]  FDA and has been authorized for detection and/or diagnosis of SARS-CoV-2 by FDA under an Emergency Use Authorization (EUA). This EUA will remain in effect (meaning this test can be used) for the duration of the COVID-19 declaration under Section 564(b)(1)  of the Act, 21 U.S.C. section 360bbb-3(b)(1), unless the authorization is terminated or revoked.  Performed at [facility], [address], [place], [place] [address]          Radiology Studies: DG HIP UNILAT WITH PELVIS 2-3 VIEWS LEFT Result Date: 03/14/2024 EXAM: 5 VIEW(S) XRAY OF THE LEFT HIP 03/14/2024 09:24:00 AM COMPARISON: 03/13/2024. CLINICAL HISTORY: Orif of intertrochanteric fracture of the proximal left femur. FINDINGS: BONES AND JOINTS: Open reduction and internal fixation of an intertrochanteric fracture of the proximal left femur. An intramedullary nail and cannulated hip screw fixate the proximal left femur fracture. SOFT TISSUES: The soft tissues are unremarkable. IMPRESSION: 1. Open reduction and internal fixation of an intertrochanteric fracture of the proximal left femur with an  intramedullary nail and cannulated screw. Electronically signed by: Waddell Calk MD 03/14/2024 09:40 AM EST RP Workstation: [id number]   DG C-Arm 1-60 Min-No Report Result Date: 03/14/2024 Fluoroscopy was utilized by the requesting physician.  No radiographic interpretation.   DG Hip Unilat W or Wo Pelvis 2-3 Views Left Result Date: 03/13/2024 EXAM: 2 or 3 VIEW(S) XRAY OF THE LEFT HIP 03/13/2024 07:34:00 PM COMPARISON: None available. CLINICAL HISTORY: fall FINDINGS: BONES AND JOINTS: There is a left femoral intertrochanteric fracture with varus angulation. No subluxation or dislocation. Symmetric degenerative changes in the hips. SOFT TISSUES: The soft tissues are unremarkable. LUMBAR SPINE: Postoperative changes in the visualized lower lumbar spine. IMPRESSION: 1. Left femoral intertrochanteric fracture with varus angulation. No subluxation or dislocation. Electronically signed by: Franky Crease MD 03/13/2024 07:45 PM EST RP Workstation: [id number]           LOS: 1 day   Time spent= 35 mins    Burgess JAYSON Dare, MD [facility]  If 7PM-7AM, please contact night-coverage  03/14/2024, 10:47 AM  "
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
## 2024-03-14 NOTE — Plan of Care (Signed)
  Problem: Pain Managment: Goal: General experience of comfort will improve and/or be controlled Outcome: Progressing   Problem: Safety: Goal: Ability to remain free from injury will improve Outcome: Progressing

## 2024-03-14 NOTE — Hospital Course (Addendum)
 David Wyatt

## 2024-03-14 NOTE — Anesthesia Postprocedure Evaluation (Signed)
"   Anesthesia Post Note  Patient: Birney Belshe  Procedure(s) Performed: FIXATION, FRACTURE, INTERTROCHANTERIC, WITH INTRAMEDULLARY ROD (Left)     Patient location during evaluation: PACU Anesthesia Type: General Level of consciousness: awake and alert Pain management: pain level controlled Vital Signs Assessment: post-procedure vital signs reviewed and stable Respiratory status: spontaneous breathing, nonlabored ventilation, respiratory function stable and patient connected to nasal cannula oxygen Cardiovascular status: blood pressure returned to baseline and stable Postop Assessment: no apparent nausea or vomiting Anesthetic complications: no   There were no known notable events for this encounter.  Last Vitals:  Vitals:   03/14/24 1015 03/14/24 1030  BP: (!) 177/91 (!) 169/96  Pulse: 79 74  Resp: 15 17  Temp:  36.7 C  SpO2: 100% 100%    Last Pain:  Vitals:   03/14/24 1000  TempSrc:   PainSc: Asleep                 Milka Windholz,W. EDMOND      "

## 2024-03-14 NOTE — Anesthesia Procedure Notes (Signed)
 Procedure Name: Intubation Date/Time: 03/14/2024 8:17 AM  Performed by: Christopher Comings, CRNAPre-anesthesia Checklist: Patient identified, Emergency Drugs available, Suction available and Patient being monitored Patient Re-evaluated:Patient Re-evaluated prior to induction Oxygen Delivery Method: Circle system utilized Preoxygenation: Pre-oxygenation with 100% oxygen Induction Type: IV induction Ventilation: Mask ventilation without difficulty and Oral airway inserted - appropriate to patient size Laryngoscope Size: Mac and 4 Grade View: Grade I Tube type: Oral Tube size: 7.5 mm Number of attempts: 1 Airway Equipment and Method: Stylet and Oral airway Placement Confirmation: ETT inserted through vocal cords under direct vision, positive ETCO2 and breath sounds checked- equal and bilateral Secured at: 22 cm Tube secured with: Tape Dental Injury: Teeth and Oropharynx as per pre-operative assessment

## 2024-03-15 DIAGNOSIS — S72001D Fracture of unspecified part of neck of right femur, subsequent encounter for closed fracture with routine healing: Secondary | ICD-10-CM | POA: Diagnosis not present

## 2024-03-15 LAB — BASIC METABOLIC PANEL WITH GFR
Anion gap: 10 (ref 5–15)
BUN: 16 mg/dL (ref 8–23)
CO2: 25 mmol/L (ref 22–32)
Calcium: 8.6 mg/dL — ABNORMAL LOW (ref 8.9–10.3)
Chloride: 99 mmol/L (ref 98–111)
Creatinine, Ser: 1.09 mg/dL (ref 0.61–1.24)
GFR, Estimated: >60 mL/min
Glucose, Bld: 286 mg/dL — ABNORMAL HIGH (ref 70–99)
Potassium: 4.1 mmol/L (ref 3.5–5.1)
Sodium: 134 mmol/L — ABNORMAL LOW (ref 135–145)

## 2024-03-15 LAB — CBC
HCT: 36.1 % — ABNORMAL LOW (ref 39.0–52.0)
Hemoglobin: 11.7 g/dL — ABNORMAL LOW (ref 13.0–17.0)
MCH: 30.8 pg (ref 26.0–34.0)
MCHC: 32.4 g/dL (ref 30.0–36.0)
MCV: 95 fL (ref 80.0–100.0)
Platelets: 449 K/uL — ABNORMAL HIGH (ref 150–400)
RBC: 3.8 MIL/uL — ABNORMAL LOW (ref 4.22–5.81)
RDW: 13.9 % (ref 11.5–15.5)
WBC: 11.8 K/uL — ABNORMAL HIGH (ref 4.0–10.5)
nRBC: 0 % (ref 0.0–0.2)

## 2024-03-15 LAB — GLUCOSE, CAPILLARY
Glucose-Capillary: 239 mg/dL — ABNORMAL HIGH (ref 70–99)
Glucose-Capillary: 284 mg/dL — ABNORMAL HIGH (ref 70–99)
Glucose-Capillary: 363 mg/dL — ABNORMAL HIGH (ref 70–99)
Glucose-Capillary: 198 mg/dL — ABNORMAL HIGH (ref 70–99)
Glucose-Capillary: 193 mg/dL — ABNORMAL HIGH (ref 70–99)

## 2024-03-15 LAB — PHOSPHORUS: Phosphorus: 2.5 mg/dL (ref 2.5–4.6)

## 2024-03-15 LAB — MAGNESIUM: Magnesium: 1.6 mg/dL — ABNORMAL LOW (ref 1.7–2.4)

## 2024-03-15 MED ORDER — INSULIN ASPART 100 UNIT/ML IJ SOLN
0.0000 [IU] | Freq: Every day | INTRAMUSCULAR | Status: DC
  Administered 2024-03-16: 3 [IU] via SUBCUTANEOUS
  Administered 2024-03-20 – 2024-03-30 (×3): 2 [IU] via SUBCUTANEOUS
  Filled 2024-03-15 (×4): qty 2
  Filled 2024-03-15 (×2): qty 5

## 2024-03-15 MED ORDER — FOLIC ACID 1 MG PO TABS
1.0000 mg | ORAL_TABLET | Freq: Every day | ORAL | Status: DC
  Administered 2024-03-15 – 2024-04-12 (×29): 1 mg via ORAL
  Filled 2024-03-15 (×29): qty 1

## 2024-03-15 MED ORDER — NICOTINE 21 MG/24HR TD PT24
21.0000 mg | MEDICATED_PATCH | Freq: Every day | TRANSDERMAL | Status: DC
  Administered 2024-03-15 – 2024-04-11 (×23): 21 mg via TRANSDERMAL
  Filled 2024-03-15 (×29): qty 1

## 2024-03-15 MED ORDER — INSULIN GLARGINE-YFGN 100 UNIT/ML ~~LOC~~ SOLN
20.0000 [IU] | Freq: Every day | SUBCUTANEOUS | Status: DC
  Administered 2024-03-15: 20 [IU] via SUBCUTANEOUS
  Filled 2024-03-15 (×2): qty 0.2

## 2024-03-15 MED ORDER — INSULIN ASPART 100 UNIT/ML IJ SOLN: 0.0000 [IU] | Freq: Three times a day (TID) | INTRAMUSCULAR | Status: DC

## 2024-03-15 NOTE — TOC CAGE-AID Note (Signed)
 Transition of Care [facility]) - CAGE-AID Screening   Patient Details  Name: David Wyatt MRN: [id number] Date of Birth: June 15, 1960  [facility] CM/SW Contact:    Vearl Allbaugh E Shalunda Lindh, LCSW Phone Number: 03/15/2024, 9:30 AM   Clinical Narrative: Disoriented x 2.   CAGE-AID Screening: Substance Abuse Screening unable to be completed due to: : Patient unable to participate

## 2024-03-15 NOTE — Plan of Care (Signed)
" °  Problem: Education: Goal: Ability to describe self-care measures that may prevent or decrease complications (Diabetes Survival Skills Education) will improve Outcome: Progressing   Problem: Coping: Goal: Ability to adjust to condition or change in health will improve Outcome: Progressing   Problem: Fluid Volume: Goal: Ability to maintain a balanced intake and output will improve Outcome: Progressing   Problem: Health Behavior/Discharge Planning: Goal: Ability to identify and utilize available resources and services will improve Outcome: Progressing Goal: Ability to manage health-related needs will improve Outcome: Progressing   Problem: Metabolic: Goal: Ability to maintain appropriate glucose levels will improve Outcome: Progressing   Problem: Nutritional: Goal: Maintenance of adequate nutrition will improve Outcome: Progressing Goal: Progress toward achieving an optimal weight will improve Outcome: Progressing   Problem: Skin Integrity: Goal: Risk for impaired skin integrity will decrease Outcome: Progressing   Problem: Tissue Perfusion: Goal: Adequacy of tissue perfusion will improve Outcome: Progressing   Problem: Education: Goal: Knowledge of General Education information will improve Description: Including pain rating scale, medication(s)/side effects and non-pharmacologic comfort measures Outcome: Progressing   Problem: Health Behavior/Discharge Planning: Goal: Ability to manage health-related needs will improve Outcome: Progressing   Problem: Clinical Measurements: Goal: Ability to maintain clinical measurements within normal limits will improve Outcome: Progressing Goal: Will remain free from infection Outcome: Progressing Goal: Diagnostic test results will improve Outcome: Progressing Goal: Respiratory complications will improve Outcome: Progressing Goal: Cardiovascular complication will be avoided Outcome: Progressing   Problem: Nutrition: Goal:  Adequate nutrition will be maintained Outcome: Progressing   Problem: Coping: Goal: Level of anxiety will decrease Outcome: Progressing   Problem: Elimination: Goal: Will not experience complications related to bowel motility Outcome: Progressing Goal: Will not experience complications related to urinary retention Outcome: Progressing   Problem: Safety: Goal: Ability to remain free from injury will improve Outcome: Progressing   Problem: Skin Integrity: Goal: Risk for impaired skin integrity will decrease Outcome: Progressing   Problem: Activity: Goal: Risk for activity intolerance will decrease Outcome: Not Progressing   Problem: Pain Managment: Goal: General experience of comfort will improve and/or be controlled Outcome: Not Progressing   "

## 2024-03-15 NOTE — Progress Notes (Signed)
 " PROGRESS NOTE    David Wyatt  FMW:[id number] DOB: 1960-10-19 DOA: 03/13/2024 PCP: Delbert Clam, MD    Brief Narrative:   64 year old with history of uncontrolled DM2, HTN, HLD, cocaine  use, diabetic neuropathy, chronic back pain, arthritis comes to the ED after a fall leading to left hip pain.  X-ray showed left-sided hip fracture, orthopedic consulted.  Left hip closed reduction/IM fixation of left hip 1/4.  Postop management including wound care, pain control, weightbearing, follow-up per their service.  Foley catheter placed due to urinary retention.    Assessment & Plan:   Left femoral intertrochanteric fracture: - Left hip closed reduction/IM fixation of left hip 1/4.  Postop management including wound care, pain control, weightbearing, follow-up per their service.   Leukocytosis Thrombocytosis:  Likely reactive.  He is afebrile.  Will continue to monitor  Urinary retention - Foley catheter placed   Uncontrolled insulin -dependent type 2 diabetes Diabetic peripheral neuropathy - Recent A1c of 11.8.  Added long-acting and sliding scale.  Will adjust as appropriate   Depression  On duloxetine     History of cocaine  use  Recommend cessation     Essential hypertension -Slowly resume home p.o. medications when able to tolerate p.o.  IV as needed   Hyperlipidemia  Continue statin   DVT prophylaxis: SCDs Start: 03/13/24 2315    Code Status: Full Code Family Communication:   Status is: Inpatient Remains inpatient appropriate because: Operative management   PT Follow up Recs:   Subjective:  Left-sided hip pain as expected.  Examination:  General exam: Appears calm and comfortable  Respiratory system: Clear to auscultation. Respiratory effort normal. Cardiovascular system: S1 & S2 heard, RRR. No JVD, murmurs, rubs, gallops or clicks. No pedal edema. Gastrointestinal system: Abdomen is nondistended, soft and nontender. No organomegaly or masses felt. Normal  bowel sounds heard. Central nervous system: Alert and oriented. No focal neurological deficits. Extremities: Symmetric 5 x 5 power. Skin: No rashes, lesions or ulcers Psychiatry: Judgement and insight appear normal. Mood & affect appropriate. Catheter in place               Diet Orders (From admission, onward)     Start     Ordered   03/14/24 1206  Diet Heart Room service appropriate? Yes; Fluid consistency: Thin  Diet effective now       Question Answer Comment  Room service appropriate? Yes   Fluid consistency: Thin      03/14/24 1206            Objective: Vitals:   03/14/24 1655 03/14/24 2139 03/15/24 0215 03/15/24 0730  BP: 129/71 129/79 129/74 125/86  Pulse: 85 76 99 (!) 103  Resp: 18 16 18 18   Temp: 98.7 F (37.1 C) 98.8 F (37.1 C)  (!) 97.5 F (36.4 C)  TempSrc: Oral Tympanic  Oral  SpO2: 100% 91% 94% 91%    Intake/Output Summary (Last 24 hours) at 03/15/2024 1109 Last data filed at 03/15/2024 0400 Gross per 24 hour  Intake 474 ml  Output 200 ml  Net 274 ml   There were no vitals filed for this visit.  Scheduled Meds:  amLODipine   10 mg Oral Daily   atorvastatin   40 mg Oral Daily   DULoxetine   60 mg Oral QPM   folic acid   1 mg Oral Daily   gabapentin   600 mg Oral TID   insulin  aspart  0-15 Units Subcutaneous TID WC   insulin  aspart  0-5 Units Subcutaneous QHS   insulin   glargine-yfgn  20 Units Subcutaneous Daily   isosorbide  mononitrate  30 mg Oral Daily   Continuous Infusions:  Nutritional status     There is no height or weight on file to calculate BMI.  Data Reviewed:   CBC: Recent Labs  Lab 03/13/24 2207 03/14/24 1124 03/15/24 0301  WBC 14.1* 13.5* 11.8*  NEUTROABS 11.6*  --   --   HGB 12.7* 12.9* 11.7*  HCT 39.1 39.5 36.1*  MCV 95.1 93.4 95.0  PLT 487* 485* 449*   Basic Metabolic Panel: Recent Labs  Lab 03/13/24 2207 03/14/24 1124 03/15/24 0301  NA 135 139 134*  K 4.5 4.7 4.1  CL 101 101 99  CO2 22 26 25    GLUCOSE 188* 235* 286*  BUN 16 14 16   CREATININE 1.09 1.10 1.09  CALCIUM  9.3 9.1 8.6*  MG  --   --  1.6*  PHOS  --   --  2.5   GFR: Estimated Creatinine Clearance: 65.9 mL/min (by C-G formula based on SCr of 1.09 mg/dL). Liver Function Tests: No results for input(s): AST, ALT, ALKPHOS, BILITOT, PROT, ALBUMIN in the last 168 hours. No results for input(s): LIPASE, AMYLASE in the last 168 hours. No results for input(s): AMMONIA in the last 168 hours. Coagulation Profile: No results for input(s): INR, PROTIME in the last 168 hours. Cardiac Enzymes: No results for input(s): CKTOTAL, CKMB, CKMBINDEX, TROPONINI in the last 168 hours. BNP (last 3 results) No results for input(s): PROBNP in the last 8760 hours. HbA1C: No results for input(s): HGBA1C in the last 72 hours. CBG: Recent Labs  Lab 03/14/24 1136 03/14/24 1623 03/14/24 2138 03/15/24 0621 03/15/24 0728  GLUCAP 208* 174* 237* 239* 284*   Lipid Profile: No results for input(s): CHOL, HDL, LDLCALC, TRIG, CHOLHDL, LDLDIRECT in the last 72 hours. Thyroid  Function Tests: No results for input(s): TSH, T4TOTAL, FREET4, T3FREE, THYROIDAB in the last 72 hours. Anemia Panel: No results for input(s): VITAMINB12, FOLATE, FERRITIN, TIBC, IRON, RETICCTPCT in the last 72 hours. Sepsis Labs: No results for input(s): PROCALCITON, LATICACIDVEN in the last 168 hours.  Recent Results (from the past 240 hours)  Resp panel by RT-PCR (RSV, Flu A&B, Covid) Anterior Nasal Swab     Status: Abnormal   Collection Time: 03/07/24  3:35 AM   Specimen: Anterior Nasal Swab  Result Value Ref Range Status   SARS Coronavirus 2 by RT PCR NEGATIVE NEGATIVE Final   Influenza A by PCR POSITIVE (A) NEGATIVE Final   Influenza B by PCR NEGATIVE NEGATIVE Final    Comment: (NOTE) The Xpert Xpress SARS-CoV-2/FLU/RSV plus assay is intended as an aid in the diagnosis of influenza from  Nasopharyngeal swab specimens and should not be used as a sole basis for treatment. Nasal washings and aspirates are unacceptable for Xpert Xpress SARS-CoV-2/FLU/RSV testing.  Fact Sheet for Patients: bloggercourse.com  Fact Sheet for Healthcare Providers: seriousbroker.it  This test is not yet approved or cleared by the [place]  FDA and has been authorized for detection and/or diagnosis of SARS-CoV-2 by FDA under an Emergency Use Authorization (EUA). This EUA will remain in effect (meaning this test can be used) for the duration of the COVID-19 declaration under Section 564(b)(1) of the Act, 21 U.S.C. section 360bbb-3(b)(1), unless the authorization is terminated or revoked.     Resp Syncytial Virus by PCR NEGATIVE NEGATIVE Final    Comment: (NOTE) Fact Sheet for Patients: bloggercourse.com  Fact Sheet for Healthcare Providers: seriousbroker.it  This test is not yet approved or  cleared by the [place]  FDA and has been authorized for detection and/or diagnosis of SARS-CoV-2 by FDA under an Emergency Use Authorization (EUA). This EUA will remain in effect (meaning this test can be used) for the duration of the COVID-19 declaration under Section 564(b)(1) of the Act, 21 U.S.C. section 360bbb-3(b)(1), unless the authorization is terminated or revoked.  Performed at [facility], [address], [place], [place] [address]          Radiology Studies: DG HIP UNILAT WITH PELVIS 2-3 VIEWS LEFT Result Date: 03/14/2024 EXAM: 5 VIEW(S) XRAY OF THE LEFT HIP 03/14/2024 09:24:00 AM COMPARISON: 03/13/2024. CLINICAL HISTORY: Orif of intertrochanteric fracture of the proximal left femur. FINDINGS: BONES AND JOINTS: Open reduction and internal fixation of an intertrochanteric fracture of the proximal left femur. An intramedullary nail and cannulated hip screw fixate the  proximal left femur fracture. SOFT TISSUES: The soft tissues are unremarkable. IMPRESSION: 1. Open reduction and internal fixation of an intertrochanteric fracture of the proximal left femur with an intramedullary nail and cannulated screw. Electronically signed by: Waddell Calk MD 03/14/2024 09:40 AM EST RP Workstation: [id number]   DG C-Arm 1-60 Min-No Report Result Date: 03/14/2024 Fluoroscopy was utilized by the requesting physician.  No radiographic interpretation.   DG Hip Unilat W or Wo Pelvis 2-3 Views Left Result Date: 03/13/2024 EXAM: 2 or 3 VIEW(S) XRAY OF THE LEFT HIP 03/13/2024 07:34:00 PM COMPARISON: None available. CLINICAL HISTORY: fall FINDINGS: BONES AND JOINTS: There is a left femoral intertrochanteric fracture with varus angulation. No subluxation or dislocation. Symmetric degenerative changes in the hips. SOFT TISSUES: The soft tissues are unremarkable. LUMBAR SPINE: Postoperative changes in the visualized lower lumbar spine. IMPRESSION: 1. Left femoral intertrochanteric fracture with varus angulation. No subluxation or dislocation. Electronically signed by: Franky Crease MD 03/13/2024 07:45 PM EST RP Workstation: [id number]           LOS: 2 days   Time spent= 35 mins    Burgess JAYSON Dare, MD [facility]  If 7PM-7AM, please contact night-coverage  03/15/2024, 11:09 AM  "

## 2024-03-15 NOTE — Evaluation (Signed)
 Occupational Therapy Evaluation Patient Details Name: David Wyatt MRN: [id number] DOB: Mar 25, 1960 Today's Date: 03/15/2024   History of Present Illness   64 y.o. male presents 03/13/24 with L hip pain. Imaging shows displaced intertrochanteric fracture on the left. S/p IM rod L hip 1/4. PMH: DM2, HTN, HLD, cocaine  use, diabetic neuropathy, chronic back pain, arthritis.     Clinical Impressions PTA Pt reports he was Mod I with rollator for functional mobility and independent with ADL tasks. Pt currently requires up to Mod A +2 for functional transfers and Max A for ADL engagement. Pt is primarily limited by pain, generalized weakness, unsteadiness on feet, decreased activity tolerance, and impaired executive functioning skills. OT to continue to follow Pt acutely to facilitate progress towards goals. Patient will benefit from continued inpatient follow up therapy, <3 hours/day.      If plan is discharge home, recommend the following:   Two people to help with walking and/or transfers;Two people to help with bathing/dressing/bathroom;Assistance with cooking/housework;Direct supervision/assist for medications management;Direct supervision/assist for financial management;Assist for transportation     Functional Status Assessment   Patient has had a recent decline in their functional status and demonstrates the ability to make significant improvements in function in a reasonable and predictable amount of time.     Equipment Recommendations   Other (comment) (rolling walker)     Recommendations for Other Services         Precautions/Restrictions   Precautions Precautions: Fall Recall of Precautions/Restrictions: Impaired Restrictions Weight Bearing Restrictions Per Provider Order: Yes LLE Weight Bearing Per Provider Order: Weight bearing as tolerated     Mobility Bed Mobility Overal bed mobility: Needs Assistance Bed Mobility: Supine to Sit     Supine to sit: Mod  assist, +2 for physical assistance, +2 for safety/equipment     General bed mobility comments: Light Mod +2 for management of LLE and to facilitate hip rotation with use of bed pad. Pt used BUE to assist as able    Transfers Overall transfer level: Needs assistance Equipment used: Rolling walker (2 wheels) Transfers: Sit to/from Stand, Bed to chair/wheelchair/BSC Sit to Stand: Mod assist, +2 physical assistance, +2 safety/equipment Stand pivot transfers: Mod assist, Max assist, +2 physical assistance, +2 safety/equipment         General transfer comment: Mod - Max A +2 for transfers OOB. Pt with very minimal tolerance for WB on LLE. Max A management of RW.      Balance Overall balance assessment: Needs assistance Sitting-balance support: Bilateral upper extremity supported, Feet supported Sitting balance-Leahy Scale: Poor Sitting balance - Comments: Pt required support from at least one UE. Strong R lateral lean to offload. With pain, Pt with spontaneous forward lean. Postural control: Right lateral lean Standing balance support: Bilateral upper extremity supported, During functional activity, Reliant on assistive device for balance Standing balance-Leahy Scale: Poor Standing balance comment: Dependent on external support                           ADL either performed or assessed with clinical judgement   ADL Overall ADL's : Needs assistance/impaired Eating/Feeding: Independent   Grooming: Contact guard assist;Sitting   Upper Body Bathing: Contact guard assist;Sitting   Lower Body Bathing: Maximal assistance   Upper Body Dressing : Contact guard assist;Sitting   Lower Body Dressing: Maximal assistance   Toilet Transfer: Moderate assistance;Maximal assistance;+2 for physical assistance;+2 for safety/equipment;Stand-pivot   Toileting- Clothing Manipulation and Hygiene: Total assistance  Vision Patient Visual Report: No change from  baseline Vision Assessment?: No apparent visual deficits     Perception         Praxis         Pertinent Vitals/Pain Pain Assessment Pain Assessment: 0-10 Pain Score: 10-Worst pain ever Pain Location: L hip Pain Descriptors / Indicators: Discomfort, Grimacing, Guarding, Moaning Pain Intervention(s): Limited activity within patient's tolerance, Monitored during session, Premedicated before session, Patient requesting pain meds-RN notified     Extremity/Trunk Assessment Upper Extremity Assessment Upper Extremity Assessment: Generalized weakness (R weaker than L)   Lower Extremity Assessment Lower Extremity Assessment: Defer to PT evaluation       Communication Communication Communication: No apparent difficulties   Cognition Arousal: Lethargic Behavior During Therapy: Anxious Cognition: Cognition impaired     Awareness: Online awareness impaired Memory impairment (select all impairments): Short-term memory Attention impairment (select first level of impairment): Sustained attention Executive functioning impairment (select all impairments): Initiation, Sequencing, Organization, Reasoning, Problem solving OT - Cognition Comments: Pt with increased response to situations. Would often close his eyes during interaction and stop responding until name called. Pt would be in conversation with staff and then ask if staff was talking to him.                 Following commands: Impaired Following commands impaired: Follows one step commands inconsistently, Follows one step commands with increased time     Cueing  General Comments   Cueing Techniques: Verbal cues;Visual cues;Tactile cues  Pt required increased time for mobility. Pain limiting factor.   Exercises     Shoulder Instructions      Home Living Family/patient expects to be discharged to:: Shelter/Homeless                                 Additional Comments: States he lives on [address]       Prior Functioning/Environment Prior Level of Function : Independent/Modified Independent             Mobility Comments: Mod I with rollator in room. Rollator is broken ADLs Comments: Independent    OT Problem List: Decreased strength;Decreased activity tolerance;Impaired balance (sitting and/or standing);Decreased safety awareness;Decreased knowledge of use of DME or AE;Decreased knowledge of precautions;Pain   OT Treatment/Interventions: Therapeutic exercise;Self-care/ADL training;Energy conservation;DME and/or AE instruction;Therapeutic activities;Patient/family education;Balance training      OT Goals(Current goals can be found in the care plan section)   Acute Rehab OT Goals Patient Stated Goal: to decrease pain OT Goal Formulation: With patient Time For Goal Achievement: 03/29/24 Potential to Achieve Goals: Good ADL Goals Pt Will Perform Lower Body Bathing: with contact guard assist;with adaptive equipment Pt Will Perform Lower Body Dressing: with contact guard assist;with adaptive equipment;sitting/lateral leans Pt Will Transfer to Toilet: bedside commode;stand pivot transfer;with contact guard assist Pt/caregiver will Perform Home Exercise Program: Both right and left upper extremity;With written HEP provided;Increased strength Additional ADL Goal #1: Pt will engage in bed mobility with supervision as a precursor to ADL tasks OOB.   OT Frequency:  Min 2X/week    Co-evaluation PT/OT/SLP Co-Evaluation/Treatment: Yes Reason for Co-Treatment: Complexity of the patient's impairments (multi-system involvement);For patient/therapist safety;To address functional/ADL transfers   OT goals addressed during session: ADL's and self-care;Proper use of Adaptive equipment and DME;Strengthening/ROM      AM-PAC OT 6 Clicks Daily Activity     Outcome Measure Help from another person eating meals?: None Help from  another person taking care of personal grooming?: A Little Help  from another person toileting, which includes using toliet, bedpan, or urinal?: Total Help from another person bathing (including washing, rinsing, drying)?: A Lot Help from another person to put on and taking off regular upper body clothing?: A Little Help from another person to put on and taking off regular lower body clothing?: A Lot 6 Click Score: 15   End of Session Equipment Utilized During Treatment: Gait belt;Rolling walker (2 wheels)  Activity Tolerance: Patient limited by pain Patient left: in chair;with call bell/phone within reach;with chair alarm set  OT Visit Diagnosis: Unsteadiness on feet (R26.81);Muscle weakness (generalized) (M62.81);History of falling (Z91.81);Pain Pain - Right/Left: Left Pain - part of body: Hip                Time: 1130-1212 OT Time Calculation (min): 42 min Charges:  OT General Charges $OT Visit: 1 Visit OT Evaluation $OT Eval Low Complexity: 1 Low OT Treatments $Therapeutic Activity: 8-22 mins  David Wyatt, OTR/L.  [facility]  Office: [phone]   David PARAS Kimmy Totten 03/15/2024, 2:44 PM

## 2024-03-15 NOTE — Evaluation (Signed)
 Physical Therapy Evaluation Patient Details Name: David Wyatt MRN: [id number] DOB: Sep 26, 1960 Today's Date: 03/15/2024  History of Present Illness  64 y.o. male presents 03/13/24 with L hip pain. Imaging shows displaced intertrochanteric fracture on the left. S/p IM rod L hip 1/4. PMH: DM2, HTN, HLD, cocaine  use, diabetic neuropathy, chronic back pain, arthritis.  Clinical Impression  Pt admitted with above diagnosis. Lives at  alone, indicated [place] [address], [address]; Prior to admission, pt was able to manage independently, using a rollator that is in disrepair; Presents to PT with significant pain limiting his ability to move, use LLE during functional transfers;  He needed mod assist to come to sitting EOB, and 2 person mod/Max assist to perform pivot transfer bed to recliner on his R with RW; If we can get his pain under control, I anticipate good progress; Pt currently with functional limitations due to the deficits listed below (see PT Problem List). Pt will benefit from skilled PT to increase their independence and safety with mobility to allow discharge to the venue listed below.           If plan is discharge home, recommend the following: Two people to help with walking and/or transfers;Two people to help with bathing/dressing/bathroom;Assist for transportation   Can travel by private vehicle        Equipment Recommendations Rolling walker (2 wheels);BSC/3in1  Recommendations for Other Services       Functional Status Assessment Patient has had a recent decline in their functional status and demonstrates the ability to make significant improvements in function in a reasonable and predictable amount of time.     Precautions / Restrictions Precautions Precautions: Fall Recall of Precautions/Restrictions: Impaired Restrictions Weight Bearing Restrictions Per Provider Order: Yes LLE Weight Bearing Per Provider Order: Weight bearing as tolerated      Mobility  Bed  Mobility Overal bed mobility: Needs Assistance Bed Mobility: Supine to Sit     Supine to sit: Mod assist, +2 for physical assistance, +2 for safety/equipment     General bed mobility comments: Light Mod +2 for management of LLE and to facilitate hip rotation with use of bed pad. Pt used BUE to assist as able    Transfers Overall transfer level: Needs assistance Equipment used: Rolling walker (2 wheels) Transfers: Sit to/from Stand, Bed to chair/wheelchair/BSC Sit to Stand: Mod assist, +2 physical assistance, +2 safety/equipment Stand pivot transfers: Mod assist, Max assist, +2 physical assistance, +2 safety/equipment         General transfer comment: Mod - Max A +2 for transfers OOB. Pt with very minimal tolerance for WB on LLE. Max A management of RW.    Ambulation/Gait                  Stairs            Wheelchair Mobility     Tilt Bed    Modified Rankin (Stroke Patients Only)       Balance Overall balance assessment: Needs assistance Sitting-balance support: Bilateral upper extremity supported, Feet supported Sitting balance-Leahy Scale: Poor Sitting balance - Comments: Pt required support from at least one UE. Strong R lateral lean to offload. With pain, Pt with spontaneous forward lean. Postural control: Right lateral lean Standing balance support: Bilateral upper extremity supported, During functional activity, Reliant on assistive device for balance Standing balance-Leahy Scale: Poor Standing balance comment: Dependent on external support  Pertinent Vitals/Pain Pain Assessment Pain Assessment: 0-10 Pain Score: 10-Worst pain ever Pain Location: L hip Pain Descriptors / Indicators: Discomfort, Grimacing, Guarding, Moaning Pain Intervention(s): Premedicated before session, Limited activity within patient's tolerance    Home Living Family/patient expects to be discharged to:: Shelter/Homeless                    Additional Comments: States he lives on [address]    Prior Function Prior Level of Function : Independent/Modified Independent             Mobility Comments: Mod I with rollator in room. Rollator is broken ADLs Comments: Independent     Extremity/Trunk Assessment   Upper Extremity Assessment Upper Extremity Assessment: Defer to OT evaluation    Lower Extremity Assessment Lower Extremity Assessment: LLE deficits/detail LLE Deficits / Details: Groslly decr AROm and strength, limited by pain post injury adn surgical repair       Communication   Communication Communication: No apparent difficulties    Cognition Arousal: Lethargic Behavior During Therapy: Anxious                             Following commands: Impaired Following commands impaired: Follows one step commands inconsistently, Follows one step commands with increased time     Cueing Cueing Techniques: Verbal cues, Visual cues, Tactile cues     General Comments General comments (skin integrity, edema, etc.): Pt required increased time for mobility. Pain limiting factor.    Exercises     Assessment/Plan    PT Assessment Patient needs continued PT services  PT Problem List Decreased strength;Decreased range of motion;Decreased activity tolerance;Decreased balance;Decreased mobility;Decreased coordination;Decreased cognition;Decreased knowledge of use of DME;Decreased safety awareness;Decreased knowledge of precautions;Pain       PT Treatment Interventions DME instruction;Gait training;Stair training;Functional mobility training;Therapeutic activities;Therapeutic exercise;Balance training;Neuromuscular re-education;Cognitive remediation;Patient/family education    PT Goals (Current goals can be found in the Care Plan section)  Acute Rehab PT Goals Patient Stated Goal: Did not state PT Goal Formulation: Patient unable to participate in goal setting Time For Goal Achievement:  03/29/24 Potential to Achieve Goals: Good    Frequency Min 2X/week     Co-evaluation   Reason for Co-Treatment: Complexity of the patient's impairments (multi-system involvement);For patient/therapist safety;To address functional/ADL transfers   OT goals addressed during session: ADL's and self-care;Proper use of Adaptive equipment and DME;Strengthening/ROM       AM-PAC PT 6 Clicks Mobility  Outcome Measure Help needed turning from your back to your side while in a flat bed without using bedrails?: A Lot Help needed moving from lying on your back to sitting on the side of a flat bed without using bedrails?: A Lot Help needed moving to and from a bed to a chair (including a wheelchair)?: Total Help needed standing up from a chair using your arms (e.g., wheelchair or bedside chair)?: Total Help needed to walk in hospital room?: Total Help needed climbing 3-5 steps with a railing? : Total 6 Click Score: 8    End of Session Equipment Utilized During Treatment: Gait belt Activity Tolerance: Patient limited by pain Patient left: in chair;with call bell/phone within reach;with chair alarm set Nurse Communication: Mobility status PT Visit Diagnosis: Unsteadiness on feet (R26.81);Pain Pain - Right/Left: Left Pain - part of body: Hip    Time: 1130-1212 PT Time Calculation (min) (ACUTE ONLY): 42 min   Charges:   PT Evaluation $PT Eval Moderate Complexity: 1  Mod   PT General Charges $$ ACUTE PT VISIT: 1 Visit         Silvano Currier, PT  Acute Rehabilitation Services Office [phone] Secure Chat welcomed   Silvano VEAR Currier 03/15/2024, 2:52 PM

## 2024-03-15 NOTE — Progress Notes (Signed)
 Mobility Specialist Progress Note:    03/15/24 1500  Mobility  Activity Respositioned in chair  Level of Assistance Minimal assist, patient does 75% or more (+2)  Assistive Device Stedy  LLE Weight Bearing Per Provider Order WBAT  Activity Response Tolerated fair  Mobility Referral Yes  Mobility visit 1 Mobility  Mobility Specialist Start Time (ACUTE ONLY) 1359  Mobility Specialist Stop Time (ACUTE ONLY) 1410  Mobility Specialist Time Calculation (min) (ACUTE ONLY) 11 min   Pt received in chair, asleep but easily awoken agreeable to mobility. Pt was able to use BUE to sit up in chair and attempted to put BLE onto stedy but patient began to fall back asleep. Pt would wake up and fall back to sleep again 3 more times. Unable to stay awake w/ multimodal cues, deferred further mobility. Repositioned patient in chair. Left w/ call bell and personal belongings in reach. All needs met. Chair alarm on.   Thersia Minder Mobility Specialist  Please contact vis Secure Chat or  Rehab Office [phone]

## 2024-03-16 ENCOUNTER — Encounter (HOSPITAL_COMMUNITY): Payer: Self-pay | Admitting: Orthopedic Surgery

## 2024-03-16 ENCOUNTER — Other Ambulatory Visit (HOSPITAL_COMMUNITY): Payer: Self-pay

## 2024-03-16 DIAGNOSIS — S72001D Fracture of unspecified part of neck of right femur, subsequent encounter for closed fracture with routine healing: Secondary | ICD-10-CM | POA: Diagnosis not present

## 2024-03-16 LAB — GLUCOSE, CAPILLARY
Glucose-Capillary: 274 mg/dL — ABNORMAL HIGH (ref 70–99)
Glucose-Capillary: 304 mg/dL — ABNORMAL HIGH (ref 70–99)
Glucose-Capillary: 223 mg/dL — ABNORMAL HIGH (ref 70–99)
Glucose-Capillary: 276 mg/dL — ABNORMAL HIGH (ref 70–99)

## 2024-03-16 MED ORDER — POLYETHYLENE GLYCOL 3350 17 G PO PACK
17.0000 g | PACK | Freq: Every day | ORAL | Status: DC
  Administered 2024-03-16 – 2024-04-11 (×16): 17 g via ORAL
  Filled 2024-03-16 (×20): qty 1

## 2024-03-16 MED ORDER — ASPIRIN 81 MG PO TBEC
81.0000 mg | DELAYED_RELEASE_TABLET | Freq: Two times a day (BID) | ORAL | 0 refills | Status: DC
  Filled 2024-03-16: qty 84, 42d supply, fill #0

## 2024-03-16 MED ORDER — INSULIN GLARGINE-YFGN 100 UNIT/ML ~~LOC~~ SOLN
25.0000 [IU] | Freq: Every day | SUBCUTANEOUS | Status: DC
  Administered 2024-03-16: 25 [IU] via SUBCUTANEOUS
  Filled 2024-03-16 (×2): qty 0.25

## 2024-03-16 MED ORDER — ACETAMINOPHEN 500 MG PO TABS: 1000.0000 mg | ORAL_TABLET | Freq: Three times a day (TID) | ORAL | Status: DC

## 2024-03-16 MED ORDER — MAGNESIUM OXIDE -MG SUPPLEMENT 400 (240 MG) MG PO TABS
800.0000 mg | ORAL_TABLET | Freq: Once | ORAL | Status: AC
  Administered 2024-03-16: 800 mg via ORAL
  Filled 2024-03-16: qty 2

## 2024-03-16 MED ORDER — CHLORHEXIDINE GLUCONATE CLOTH 2 % EX PADS
6.0000 | MEDICATED_PAD | Freq: Every day | CUTANEOUS | Status: DC
  Administered 2024-03-16 – 2024-03-26 (×11): 6 via TOPICAL

## 2024-03-16 MED ORDER — ASPIRIN 81 MG PO TBEC
81.0000 mg | DELAYED_RELEASE_TABLET | Freq: Two times a day (BID) | ORAL | Status: DC
  Administered 2024-03-16 – 2024-04-12 (×55): 81 mg via ORAL
  Filled 2024-03-16 (×55): qty 1

## 2024-03-16 MED ORDER — DOCUSATE SODIUM 100 MG PO CAPS
100.0000 mg | ORAL_CAPSULE | Freq: Two times a day (BID) | ORAL | Status: DC
  Administered 2024-03-16 – 2024-03-24 (×14): 100 mg via ORAL
  Filled 2024-03-16 (×17): qty 1

## 2024-03-16 MED ORDER — POLYETHYLENE GLYCOL 3350 17 G PO PACK: 17.0000 g | PACK | Freq: Every day | ORAL | Status: DC

## 2024-03-16 MED ORDER — DOCUSATE SODIUM 100 MG PO CAPS: 100.0000 mg | ORAL_CAPSULE | Freq: Two times a day (BID) | ORAL | Status: AC

## 2024-03-16 MED ORDER — INSULIN ASPART 100 UNIT/ML IJ SOLN
3.0000 [IU] | Freq: Three times a day (TID) | INTRAMUSCULAR | Status: DC
  Administered 2024-03-16 – 2024-03-17 (×3): 3 [IU] via SUBCUTANEOUS
  Filled 2024-03-16 (×2): qty 3

## 2024-03-16 MED ORDER — NICOTINE 21 MG/24HR TD PT24: 21.0000 mg | MEDICATED_PATCH | Freq: Every day | TRANSDERMAL | Status: AC

## 2024-03-16 MED ORDER — SENNOSIDES-DOCUSATE SODIUM 8.6-50 MG PO TABS: 1.0000 | ORAL_TABLET | Freq: Every evening | ORAL | Status: DC | PRN

## 2024-03-16 NOTE — Plan of Care (Signed)
 Patient was educated on IS and infection prevention and mobility to prevent pneumonia and skin breakdown   Problem: Metabolic: Goal: Ability to maintain appropriate glucose levels will improve Outcome: Progressing   Problem: Skin Integrity: Goal: Risk for impaired skin integrity will decrease Outcome: Progressing   Problem: Activity: Goal: Risk for activity intolerance will decrease Outcome: Progressing   Problem: Pain Managment: Goal: General experience of comfort will improve and/or be controlled Outcome: Progressing   Problem: Safety: Goal: Ability to remain free from injury will improve Outcome: Progressing   Problem: Skin Integrity: Goal: Risk for impaired skin integrity will decrease Outcome: Progressing

## 2024-03-16 NOTE — Progress Notes (Signed)
 " PROGRESS NOTE    David Wyatt  FMW:[id number] DOB: 1960-04-29 DOA: 03/13/2024 PCP: Delbert Clam, MD    Brief Narrative:   64 year old with history of uncontrolled DM2, HTN, HLD, cocaine  use, diabetic neuropathy, chronic back pain, arthritis comes to the ED after a fall leading to left hip pain.  X-ray showed left-sided hip fracture, orthopedic consulted.  Left hip closed reduction/IM fixation of left hip 1/4.  Postop management including wound care, pain control, weightbearing, follow-up per their service.  Foley catheter placed due to urinary retention.    Assessment & Plan:   Left femoral intertrochanteric fracture: - Left hip closed reduction/IM fixation of left hip 1/4.  Postop management including wound care, pain control, weightbearing, follow-up per their service.   Leukocytosis Thrombocytosis:  Likely reactive.  He is afebrile.  Will continue to monitor  Urinary retention - Foley catheter placed, voiding trial 1 week from placement   Uncontrolled insulin -dependent type 2 diabetes Diabetic peripheral neuropathy - Recent A1c of 11.8.  Added long-acting and sliding scale.  Will adjust as appropriate   Depression  On duloxetine     History of cocaine  use  Recommend cessation     Essential hypertension -Slowly resume home p.o. medications when able to tolerate p.o.  IV as needed   Hyperlipidemia  Continue statin   DVT prophylaxis: SCDs Start: 03/13/24 2315    Code Status: Full Code Family Communication:   Status is: Inpatient Remains inpatient appropriate because: SNF placement  PT Follow up Recs: Recommend Snf, Barriers To Snf Placement - Toc To F/U With Patient/Family For D/C Plans1/07/2024 1445  Subjective:  Left-sided hip pain as expected. Tells me he has not had a bowel movement in a few days  Examination:  General exam: Appears calm and comfortable  Respiratory system: Clear to auscultation. Respiratory effort normal. Cardiovascular system: S1 & S2  heard, RRR. No JVD, murmurs, rubs, gallops or clicks. No pedal edema. Gastrointestinal system: Abdomen is nondistended, soft and nontender. No organomegaly or masses felt. Normal bowel sounds heard. Central nervous system: Alert and oriented. No focal neurological deficits. Extremities: Symmetric 5 x 5 power. Skin: No rashes, lesions or ulcers Psychiatry: Judgement and insight appear normal. Mood & affect appropriate. Catheter in place               Diet Orders (From admission, onward)     Start     Ordered   03/14/24 1206  Diet Heart Room service appropriate? Yes; Fluid consistency: Thin  Diet effective now       Question Answer Comment  Room service appropriate? Yes   Fluid consistency: Thin      03/14/24 1206            Objective: Vitals:   03/15/24 1500 03/15/24 1958 03/16/24 0449 03/16/24 0745  BP:  103/70 133/76 126/79  Pulse:  87 98 93  Resp:  19 20 16   Temp:  98.2 F (36.8 C) 98.4 F (36.9 C) 98.4 F (36.9 C)  TempSrc:  Oral Oral Oral  SpO2:  91% 91% 92%  Weight: 68 kg     Height: 5' 8 (1.727 m)       Intake/Output Summary (Last 24 hours) at 03/16/2024 1017 Last data filed at 03/16/2024 0737 Gross per 24 hour  Intake 720 ml  Output 1400 ml  Net -680 ml   Filed Weights   03/15/24 1500  Weight: 68 kg    Scheduled Meds:  amLODipine   10 mg Oral Daily   aspirin  EC  81 mg Oral BID   atorvastatin   40 mg Oral Daily   docusate sodium   100 mg Oral BID   DULoxetine   60 mg Oral QPM   folic acid   1 mg Oral Daily   gabapentin   600 mg Oral TID   insulin  aspart  0-15 Units Subcutaneous TID WC   insulin  aspart  0-5 Units Subcutaneous QHS   insulin  aspart  3 Units Subcutaneous TID WC   insulin  glargine-yfgn  25 Units Subcutaneous Daily   isosorbide  mononitrate  30 mg Oral Daily   magnesium  oxide  800 mg Oral Once   nicotine   21 mg Transdermal Daily   polyethylene glycol  17 g Oral Daily   Continuous Infusions:  Nutritional status     Body mass  index is 22.79 kg/m.  Data Reviewed:   CBC: Recent Labs  Lab 03/13/24 2207 03/14/24 1124 03/15/24 0301  WBC 14.1* 13.5* 11.8*  NEUTROABS 11.6*  --   --   HGB 12.7* 12.9* 11.7*  HCT 39.1 39.5 36.1*  MCV 95.1 93.4 95.0  PLT 487* 485* 449*   Basic Metabolic Panel: Recent Labs  Lab 03/13/24 2207 03/14/24 1124 03/15/24 0301  NA 135 139 134*  K 4.5 4.7 4.1  CL 101 101 99  CO2 22 26 25   GLUCOSE 188* 235* 286*  BUN 16 14 16   CREATININE 1.09 1.10 1.09  CALCIUM  9.3 9.1 8.6*  MG  --   --  1.6*  PHOS  --   --  2.5   GFR: Estimated Creatinine Clearance: 65.9 mL/min (by C-G formula based on SCr of 1.09 mg/dL). Liver Function Tests: No results for input(s): AST, ALT, ALKPHOS, BILITOT, PROT, ALBUMIN in the last 168 hours. No results for input(s): LIPASE, AMYLASE in the last 168 hours. No results for input(s): AMMONIA in the last 168 hours. Coagulation Profile: No results for input(s): INR, PROTIME in the last 168 hours. Cardiac Enzymes: No results for input(s): CKTOTAL, CKMB, CKMBINDEX, TROPONINI in the last 168 hours. BNP (last 3 results) No results for input(s): PROBNP in the last 8760 hours. HbA1C: No results for input(s): HGBA1C in the last 72 hours. CBG: Recent Labs  Lab 03/15/24 0728 03/15/24 1126 03/15/24 1644 03/15/24 2132 03/16/24 0555  GLUCAP 284* 363* 198* 193* 274*   Lipid Profile: No results for input(s): CHOL, HDL, LDLCALC, TRIG, CHOLHDL, LDLDIRECT in the last 72 hours. Thyroid  Function Tests: No results for input(s): TSH, T4TOTAL, FREET4, T3FREE, THYROIDAB in the last 72 hours. Anemia Panel: No results for input(s): VITAMINB12, FOLATE, FERRITIN, TIBC, IRON, RETICCTPCT in the last 72 hours. Sepsis Labs: No results for input(s): PROCALCITON, LATICACIDVEN in the last 168 hours.  Recent Results (from the past 240 hours)  Resp panel by RT-PCR (RSV, Flu A&B, Covid) Anterior Nasal Swab      Status: Abnormal   Collection Time: 03/07/24  3:35 AM   Specimen: Anterior Nasal Swab  Result Value Ref Range Status   SARS Coronavirus 2 by RT PCR NEGATIVE NEGATIVE Final   Influenza A by PCR POSITIVE (A) NEGATIVE Final   Influenza B by PCR NEGATIVE NEGATIVE Final    Comment: (NOTE) The Xpert Xpress SARS-CoV-2/FLU/RSV plus assay is intended as an aid in the diagnosis of influenza from Nasopharyngeal swab specimens and should not be used as a sole basis for treatment. Nasal washings and aspirates are unacceptable for Xpert Xpress SARS-CoV-2/FLU/RSV testing.  Fact Sheet for Patients: bloggercourse.com  Fact Sheet for Healthcare Providers: seriousbroker.it  This test is not yet  approved or cleared by the [place]  FDA and has been authorized for detection and/or diagnosis of SARS-CoV-2 by FDA under an Emergency Use Authorization (EUA). This EUA will remain in effect (meaning this test can be used) for the duration of the COVID-19 declaration under Section 564(b)(1) of the Act, 21 U.S.C. section 360bbb-3(b)(1), unless the authorization is terminated or revoked.     Resp Syncytial Virus by PCR NEGATIVE NEGATIVE Final    Comment: (NOTE) Fact Sheet for Patients: bloggercourse.com  Fact Sheet for Healthcare Providers: seriousbroker.it  This test is not yet approved or cleared by the [place]  FDA and has been authorized for detection and/or diagnosis of SARS-CoV-2 by FDA under an Emergency Use Authorization (EUA). This EUA will remain in effect (meaning this test can be used) for the duration of the COVID-19 declaration under Section 564(b)(1) of the Act, 21 U.S.C. section 360bbb-3(b)(1), unless the authorization is terminated or revoked.  Performed at [facility], [address], [place], [place] [address]          Radiology Studies: No results  found.         LOS: 3 days   Time spent= 35 mins    Burgess JAYSON Dare, MD [facility]  If 7PM-7AM, please contact night-coverage  03/16/2024, 10:17 AM  "

## 2024-03-16 NOTE — Progress Notes (Signed)
 Physical Therapy Treatment Patient Details Name: David Wyatt MRN: [id number] DOB: January 29, 1961 Today's Date: 03/16/2024   History of Present Illness 64 y.o. male presents 03/13/24 with L hip pain. Imaging shows displaced intertrochanteric fracture on the left. S/p IM rod L hip 1/4. PMH: DM2, HTN, HLD, cocaine  use, diabetic neuropathy, chronic back pain, arthritis.    PT Comments  Continuing to work on functional mobility and activity tolerance; session focused on transfer training and initial attempts at taking steps with RW; Used a gait belt to allow patient more control in helping his painful left lower extremity across the surface of the bed to the edge of bed, and to ease it down to the floor; patient was more open to demonstration of sequencing and techniques for walking; still with difficulty advancing left lower extremity, so he began stepping the right lower extremity, using the rolling walker well to offload his painful left lower extremity in stance; completed one cycle of right and left stepping with rolling walker; with attempts at the second step, he was too painful, and flexed significantly at the hips, putting his elbows on the rolling walker; This PT pulled the chair to him, and he sat safely with cues for hand placement and control; patient remains very concerned about pain control; still, Im very pleased with how much he was able to do this session; I anticipate good progress with his physical rehab rehabilitation once his pain can be under control.    If plan is discharge home, recommend the following: Two people to help with walking and/or transfers;Two people to help with bathing/dressing/bathroom;Assist for transportation   Can travel by private vehicle     No  Equipment Recommendations  Rolling walker (2 wheels);BSC/3in1    Recommendations for Other Services       Precautions / Restrictions Precautions Precautions: Fall Recall of Precautions/Restrictions:  Impaired Restrictions LLE Weight Bearing Per Provider Order: Weight bearing as tolerated     Mobility  Bed Mobility Overal bed mobility: Needs Assistance Bed Mobility: Supine to Sit     Supine to sit: Mod assist     General bed mobility comments: Used gait belt around L foot to help LLE off of teh bed; Ligth mod handheld assist to pull trunk to sit    Transfers Overall transfer level: Needs assistance Equipment used: Rolling walker (2 wheels) Transfers: Sit to/from Stand, Bed to chair/wheelchair/BSC Sit to Stand: Min assist, From elevated surface   Step pivot transfers: Mod assist, Max assist       General transfer comment: Incr time for pt to consider his plan for getting up to standing, but more recptive to cues/suggestions; Min assist to rise to fully standing; Pt able to take 1 full step R and L, using RW to unweigh painful LLE; after that, he required teh recliner to be pulled up to him to safely sit    Ambulation/Gait                   Stairs             Wheelchair Mobility     Tilt Bed    Modified Rankin (Stroke Patients Only)       Balance     Sitting balance-Leahy Scale: Fair       Standing balance-Leahy Scale: Poor                              Communication Communication Communication: No  apparent difficulties  Cognition Arousal: Alert Behavior During Therapy: WFL for tasks assessed/performed, Impulsive                             Following commands: Impaired Following commands impaired: Only follows one step commands consistently    Cueing Cueing Techniques: Verbal cues, Visual cues (demonstrational cues)  Exercises General Exercises - Lower Extremity Heel Slides: AAROM, Left, 5 reps (Self-AAROM with gait belt)    General Comments General comments (skin integrity, edema, etc.): Needed incr time for mobiltiy; had questions about his surgery; this PT showed him post op xrays      Pertinent  Vitals/Pain Pain Assessment Pain Assessment: Faces Faces Pain Scale: Hurts even more Pain Location: L hip Pain Descriptors / Indicators: Discomfort, Grimacing, Guarding, Moaning Pain Intervention(s): Repositioned    Home Living                          Prior Function            PT Goals (current goals can now be found in the care plan section) Acute Rehab PT Goals Patient Stated Goal: Did not state PT Goal Formulation: Patient unable to participate in goal setting Time For Goal Achievement: 03/29/24 Potential to Achieve Goals: Good Progress towards PT goals: Progressing toward goals    Frequency    Min 2X/week      PT Plan      Co-evaluation              AM-PAC PT 6 Clicks Mobility   Outcome Measure  Help needed turning from your back to your side while in a flat bed without using bedrails?: A Lot Help needed moving from lying on your back to sitting on the side of a flat bed without using bedrails?: A Little Help needed moving to and from a bed to a chair (including a wheelchair)?: A Lot Help needed standing up from a chair using your arms (e.g., wheelchair or bedside chair)?: A Lot Help needed to walk in hospital room?: Total Help needed climbing 3-5 steps with a railing? : Total 6 Click Score: 11    End of Session Equipment Utilized During Treatment: Gait belt Activity Tolerance: Patient limited by pain Patient left: in chair;with call bell/phone within reach;with chair alarm set Nurse Communication: Mobility status PT Visit Diagnosis: Unsteadiness on feet (R26.81);Pain Pain - Right/Left: Left Pain - part of body: Hip     Time: 1400-1430 PT Time Calculation (min) (ACUTE ONLY): 30 min  Charges:    $Therapeutic Activity: 23-37 mins PT General Charges $$ ACUTE PT VISIT: 1 Visit                     Silvano Currier, PT  Acute Rehabilitation Services Office [phone] Secure Chat welcomed    Silvano VEAR Currier 03/16/2024, 5:46 PM

## 2024-03-16 NOTE — [place] FL2 (Signed)
 " [place]  MEDICAID FL2 LEVEL OF CARE FORM     IDENTIFICATION  Patient Name: David Wyatt Birthdate: 01/10/1961 Sex: male Admission Date (Current Location): 03/13/2024  [place] and [place] Number:  [place] [id number] O Facility and Address:  [facility] [place], [address] [place], [place] [address]      Provider Number: [id number]  Attending Physician Name and Address:  Caleen Burgess BROCKS, MD  Relative Name and Phone Number:  Femoral fracture [facility])    Current Level of Care: Hospital Recommended Level of Care: Skilled Nursing Facility Prior Approval Number:    Date Approved/Denied:   PASRR Number: [id number]  Discharge Plan: SNF    Current Diagnoses: Patient Active Problem List   Diagnosis Date Noted   Femoral fracture (HCC) 03/13/2024   Acute hypoxic respiratory failure (HCC) 03/07/2024   Uncontrolled type 2 diabetes mellitus with hypoglycemia, with long-term current use of insulin  (HCC) 03/07/2024   Influenza A 03/07/2024   Onychomycosis 02/17/2024   Memory loss 02/17/2024   Paranoia (HCC) 01/24/2024   Depression with suicidal ideation 01/23/2024   Polysubstance abuse (HCC) 01/23/2024   Depressive disorder 12/05/2023   Suicidal ideation 12/02/2023   Nausea vomiting and diarrhea 11/27/2023   MDD (major depressive disorder), recurrent severe, 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   Sheltered homelessness 06/18/2022   Inadequate community resources 06/18/2022   History of cocaine  use 07/23/2021   Renal stones 07/23/2021   Dermoid cyst of skin of back 07/23/2021   Uncontrolled type 2 diabetes mellitus with hyperglycemia (HCC) 03/22/2021   Left kidney mass 03/22/2021   Type 2 diabetes mellitus with hyperglycemia (HCC) 02/23/2021   Tobacco abuse 08/19/2018   Hyperlipidemia 04/24/2017   GERD (gastroesophageal reflux disease) 06/13/2015   Diabetic neuropathy (HCC) 05/01/2015   Essential hypertension, benign 11/01/2013   Lumbar disc herniation  09/13/2013   Chronic back pain 09/13/2013   Crack cocaine  use 07/29/2012   Arthritis 07/29/2012    Orientation RESPIRATION BLADDER Height & Weight     Self, Time, Situation, Place  Normal Continent, Indwelling catheter Weight: 149 lb 14.6 oz (68 kg) Height:  5' 8 (172.7 cm)  BEHAVIORAL SYMPTOMS/MOOD NEUROLOGICAL BOWEL NUTRITION STATUS        Diet (see discharge summary)  AMBULATORY STATUS COMMUNICATION OF NEEDS Skin   Total Care Verbally Surgical wounds                       Personal Care Assistance Level of Assistance  Bathing, Feeding, Dressing Bathing Assistance: Maximum assistance Feeding assistance: Independent Dressing Assistance: Maximum assistance     Functional Limitations Info  Sight, Hearing, Speech Sight Info: Adequate Hearing Info: Adequate Speech Info: Adequate    SPECIAL CARE FACTORS FREQUENCY  PT (By licensed PT), OT (By licensed OT)     PT Frequency: 5x week OT Frequency: 5x week            Contractures Contractures Info: Not present    Additional Factors Info  Code Status, Allergies, Insulin  Sliding Scale Code Status Info: full Allergies Info: Lisinopril    Insulin  Sliding Scale Info: Novolog : see discharge summary       Current Medications (03/16/2024):  This is the current hospital active medication list Current Facility-Administered Medications  Medication Dose Route Frequency Provider Last Rate Last Admin   acetaminophen  (TYLENOL ) tablet 650 mg  650 mg Oral Q6H PRN Pahwani, Rinka R, MD       Or   acetaminophen  (TYLENOL ) suppository 650 mg  650 mg Rectal  Q6H PRN Pahwani, Rinka R, MD       amLODipine  (NORVASC ) tablet 10 mg  10 mg Oral Daily Pahwani, Rinka R, MD   10 mg at 03/16/24 1026   aspirin  EC tablet 81 mg  81 mg Oral BID Amin, Ankit C, MD   81 mg at 03/16/24 1027   atorvastatin  (LIPITOR) tablet 40 mg  40 mg Oral Daily Pahwani, Rinka R, MD   40 mg at 03/16/24 1026   Chlorhexidine  Gluconate Cloth 2 % PADS 6 each  6 each Topical  Daily Amin, Ankit C, MD   6 each at 03/16/24 1320   docusate sodium  (COLACE) capsule 100 mg  100 mg Oral BID Amin, Ankit C, MD   100 mg at 03/16/24 1026   DULoxetine  (CYMBALTA ) DR capsule 60 mg  60 mg Oral QPM Pahwani, Rinka R, MD   60 mg at 03/15/24 1706   folic acid  (FOLVITE ) tablet 1 mg  1 mg Oral Daily Amin, Ankit C, MD   1 mg at 03/16/24 1027   gabapentin  (NEURONTIN ) capsule 600 mg  600 mg Oral TID Pahwani, Rinka R, MD   600 mg at 03/16/24 1027   glucagon  (human recombinant) (GLUCAGEN) injection 1 mg  1 mg Intravenous PRN Amin, Ankit C, MD       guaiFENesin  (ROBITUSSIN) 100 MG/5ML liquid 5 mL  5 mL Oral Q4H PRN Amin, Ankit C, MD   5 mL at 03/14/24 1104   hydrALAZINE  (APRESOLINE ) injection 10 mg  10 mg Intravenous Q6H PRN Pahwani, Rinka R, MD       HYDROcodone -acetaminophen  (NORCO/VICODIN) 5-325 MG per tablet 1-2 tablet  1-2 tablet Oral Q4H PRN Beuford Anes, MD   2 tablet at 03/16/24 1027   HYDROmorphone  (DILAUDID ) injection 0.5-1 mg  0.5-1 mg Intravenous Q2H PRN Pahwani, Rinka R, MD   1 mg at 03/16/24 0317   insulin  aspart (novoLOG ) injection 0-15 Units  0-15 Units Subcutaneous TID WC Pahwani, Rinka R, MD   11 Units at 03/16/24 1200   insulin  aspart (novoLOG ) injection 0-5 Units  0-5 Units Subcutaneous QHS Amin, Ankit C, MD       insulin  aspart (novoLOG ) injection 3 Units  3 Units Subcutaneous TID WC Amin, Ankit C, MD   3 Units at 03/16/24 1319   insulin  glargine-yfgn (SEMGLEE ) injection 25 Units  25 Units Subcutaneous Daily Amin, Ankit C, MD   25 Units at 03/16/24 1035   ipratropium-albuterol  (DUONEB) 0.5-2.5 (3) MG/3ML nebulizer solution 3 mL  3 mL Nebulization Q4H PRN Amin, Ankit C, MD       isosorbide  mononitrate (IMDUR ) 24 hr tablet 30 mg  30 mg Oral Daily Pahwani, Rinka R, MD   30 mg at 03/16/24 1026   metoprolol  tartrate (LOPRESSOR ) injection 5 mg  5 mg Intravenous Q4H PRN Amin, Ankit C, MD       nicotine  (NICODERM CQ  - dosed in mg/24 hours) patch 21 mg  21 mg Transdermal Daily Amin,  Ankit C, MD   21 mg at 03/16/24 1025   ondansetron  (ZOFRAN ) tablet 4 mg  4 mg Oral Q6H PRN Pahwani, Rinka R, MD       Or   ondansetron  (ZOFRAN ) injection 4 mg  4 mg Intravenous Q6H PRN Pahwani, Rinka R, MD   4 mg at 03/14/24 0916   oxyCODONE  (Oxy IR/ROXICODONE ) immediate release tablet 5 mg  5 mg Oral Q4H PRN Pahwani, Rinka R, MD       polyethylene glycol (MIRALAX  / GLYCOLAX ) packet 17 g  17 g Oral Daily Amin, Ankit C, MD   17 g at 03/16/24 1026   senna-docusate (Senokot-S) tablet 1 tablet  1 tablet Oral QHS PRN Amin, Ankit C, MD   1 tablet at 03/15/24 2148     Discharge Medications: Please see discharge summary for a list of discharge medications.  Relevant Imaging Results:  Relevant Lab Results:   Additional Information SSN: [id number]  Bilbo, [place], LCSW     "

## 2024-03-16 NOTE — TOC Initial Note (Signed)
 Transition of Care [facility]) - Initial/Assessment Note    Patient Details  Name: David Wyatt MRN: [id number] Date of Birth: 1960-05-14  [facility]) CM/SW Contact:    David Cordella Simmonds, LCSW Phone Number: 03/16/2024, 2:44 PM  Clinical Narrative:  CSW met with pt regarding PT recommendation for SNF.  Pt reports he is homeless, has been staying on the street quite a bit but most recently was at [employer].  Pt contact is his sister David Wyatt, permission given to speak with her.  Pt is agreeable to SNF, discussed some of the barriers to SNF: medicaid, no stable living situation.  Permission given to send out referral in the hub.  Referral sent out in hub for SNF.                   Expected Discharge Plan: Skilled Nursing Facility Barriers to Discharge: Continued Medical Work up, SNF Pending bed offer   Patient Goals and CMS Choice Patient states their goals for this hospitalization and ongoing recovery are:: I don't know          Expected Discharge Plan and Services In-house Referral: Clinical Social Work   Post Acute Care Choice: Skilled Nursing Facility Living arrangements for the past 2 months: Homeless Shelter, Homeless ([employer] and on the street) Expected Discharge Date: 03/16/24                                    Prior Living Arrangements/Services Living arrangements for the past 2 months: Homeless Shelter, Homeless ([employer] and on the street) Lives with:: Other (Comment) (Homeless) Patient language and need for interpreter reviewed:: Yes        Need for Family Participation in Patient Care: No (Comment) Care giver support system in place?: Yes (comment) Current home services: Other (comment) (none) Criminal Activity/Legal Involvement Pertinent to Current Situation/Hospitalization: No - Comment as needed  Activities of Daily Living   ADL Screening (condition at time of admission) Independently performs ADLs?: Yes (appropriate for  developmental age) Is the patient deaf or have difficulty hearing?: No Does the patient have difficulty seeing, even when wearing glasses/contacts?: No Does the patient have difficulty concentrating, remembering, or making decisions?: No  Permission Sought/Granted Permission sought to share information with : Family Supports Permission granted to share information with : Yes, Verbal Permission Granted  Share Information with NAME: sister David Wyatt  Permission granted to share info w AGENCY: SNF        Emotional Assessment Appearance:: Appears stated age Attitude/Demeanor/Rapport: Engaged Affect (typically observed): Appropriate Orientation: : Oriented to Self, Oriented to Place, Oriented to  Time, Oriented to Situation      Admission diagnosis:  Femoral fracture (HCC) [S72.90XA] Displaced intertroch fx left femur, init for opn fx type I/2 (HCC) [S72.142B] Patient Active Problem List   Diagnosis Date Noted   Femoral fracture (HCC) 03/13/2024   Acute hypoxic respiratory failure (HCC) 03/07/2024   Uncontrolled type 2 diabetes mellitus with hypoglycemia, with long-term current use of insulin  (HCC) 03/07/2024   Influenza A 03/07/2024   Onychomycosis 02/17/2024   Memory loss 02/17/2024   Paranoia (HCC) 01/24/2024   Depression with suicidal ideation 01/23/2024   Polysubstance abuse (HCC) 01/23/2024   Depressive disorder 12/05/2023   Suicidal ideation 12/02/2023   Nausea vomiting and diarrhea 11/27/2023   MDD (major depressive disorder), recurrent severe, 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   Sheltered homelessness 06/18/2022   Inadequate  community resources 06/18/2022   History of cocaine  use 07/23/2021   Renal stones 07/23/2021   Dermoid cyst of skin of back 07/23/2021   Uncontrolled type 2 diabetes mellitus with hyperglycemia (HCC) 03/22/2021   Left kidney mass 03/22/2021   Type 2 diabetes mellitus with hyperglycemia (HCC) 02/23/2021   Tobacco abuse 08/19/2018   Hyperlipidemia  04/24/2017   GERD (gastroesophageal reflux disease) 06/13/2015   Diabetic neuropathy (HCC) 05/01/2015   Essential hypertension, benign 11/01/2013   Lumbar disc herniation 09/13/2013   Chronic back pain 09/13/2013   Crack cocaine  use 07/29/2012   Arthritis 07/29/2012   PCP:  David Clam, MD Pharmacy:   [facility] - [facility] [address] [place] [address] Phone: [phone] Fax: [phone]  [facility] [address] [place] [address] Phone: [phone] Fax: [phone]  [id number] [place], [place] - [address] [facility] [address] [place] [address] Phone: [phone] Fax: [phone]  [facility] - [facility] [address] [place] [address] Phone: [phone] Fax: [phone]     Social Drivers of Health (SDOH) Social History: SDOH Screenings   Food Insecurity: Food Insecurity Present (03/15/2024)  Housing: High Risk (03/15/2024)  Transportation Needs: Unmet Transportation Needs (03/15/2024)  Utilities: At Risk (03/15/2024)  Alcohol Screen: Low Risk (12/07/2023)  Depression (PHQ2-9): Medium Risk (12/06/2023)  Financial Resource Strain: High Risk (01/15/2024)  Social Connections: Unknown (03/15/2024)  Tobacco Use: Medium Risk (03/14/2024)   SDOH Interventions:     Readmission Risk Interventions     No data to display

## 2024-03-16 NOTE — Plan of Care (Signed)

## 2024-03-17 ENCOUNTER — Other Ambulatory Visit (HOSPITAL_COMMUNITY): Payer: Self-pay

## 2024-03-17 DIAGNOSIS — S72001F Fracture of unspecified part of neck of right femur, subsequent encounter for open fracture type IIIA, IIIB, or IIIC with routine healing: Secondary | ICD-10-CM

## 2024-03-17 DIAGNOSIS — S72001D Fracture of unspecified part of neck of right femur, subsequent encounter for closed fracture with routine healing: Secondary | ICD-10-CM | POA: Diagnosis not present

## 2024-03-17 LAB — GLUCOSE, CAPILLARY
Glucose-Capillary: 199 mg/dL — ABNORMAL HIGH (ref 70–99)
Glucose-Capillary: 303 mg/dL — ABNORMAL HIGH (ref 70–99)
Glucose-Capillary: 265 mg/dL — ABNORMAL HIGH (ref 70–99)
Glucose-Capillary: 99 mg/dL (ref 70–99)

## 2024-03-17 MED ORDER — MAGNESIUM CITRATE PO SOLN
1.0000 | Freq: Once | ORAL | Status: AC
  Administered 2024-03-17: 1 via ORAL
  Filled 2024-03-17: qty 296

## 2024-03-17 MED ORDER — OXYCODONE HCL 5 MG PO TABS
5.0000 mg | ORAL_TABLET | ORAL | Status: DC | PRN
  Administered 2024-03-17 – 2024-03-21 (×17): 10 mg via ORAL
  Administered 2024-03-22: 5 mg via ORAL
  Administered 2024-03-22 – 2024-03-23 (×4): 10 mg via ORAL
  Administered 2024-03-23: 5 mg via ORAL
  Administered 2024-03-23 – 2024-03-26 (×10): 10 mg via ORAL
  Filled 2024-03-17 (×3): qty 2
  Filled 2024-03-17: qty 1
  Filled 2024-03-17 (×24): qty 2
  Filled 2024-03-17 (×2): qty 1
  Filled 2024-03-17: qty 2
  Filled 2024-03-17: qty 1
  Filled 2024-03-17 (×3): qty 2

## 2024-03-17 MED ORDER — ACETAMINOPHEN 500 MG PO TABS
1000.0000 mg | ORAL_TABLET | Freq: Three times a day (TID) | ORAL | Status: DC
  Administered 2024-03-17 – 2024-03-24 (×21): 1000 mg via ORAL
  Filled 2024-03-17 (×22): qty 2

## 2024-03-17 MED ORDER — LACTULOSE 10 GM/15ML PO SOLN
20.0000 g | Freq: Two times a day (BID) | ORAL | Status: DC
  Administered 2024-03-17 – 2024-04-12 (×39): 20 g via ORAL
  Filled 2024-03-17 (×47): qty 30

## 2024-03-17 MED ORDER — INSULIN ASPART 100 UNIT/ML IJ SOLN
5.0000 [IU] | Freq: Three times a day (TID) | INTRAMUSCULAR | Status: DC
  Administered 2024-03-17 – 2024-04-02 (×43): 5 [IU] via SUBCUTANEOUS
  Filled 2024-03-17 (×39): qty 5

## 2024-03-17 MED ORDER — INSULIN GLARGINE-YFGN 100 UNIT/ML ~~LOC~~ SOLN
30.0000 [IU] | Freq: Every day | SUBCUTANEOUS | Status: DC
  Administered 2024-03-17 – 2024-04-06 (×21): 30 [IU] via SUBCUTANEOUS
  Filled 2024-03-17 (×22): qty 0.3

## 2024-03-17 MED ORDER — BISACODYL 5 MG PO TBEC
10.0000 mg | DELAYED_RELEASE_TABLET | Freq: Every day | ORAL | Status: DC
  Administered 2024-03-17 – 2024-03-26 (×7): 10 mg via ORAL
  Filled 2024-03-17 (×7): qty 2

## 2024-03-17 NOTE — Progress Notes (Signed)
 " PROGRESS NOTE    David Wyatt  FMW:[id number] DOB: 1961/01/18 DOA: 03/13/2024 PCP: Delbert Clam, MD    Brief Narrative:   64 year old with history of uncontrolled DM2, HTN, HLD, cocaine  use, diabetic neuropathy, chronic back pain, arthritis comes to the ED after a fall leading to left hip pain.  X-ray showed left-sided hip fracture, orthopedic consulted.  Left hip closed reduction/IM fixation of left hip 1/4.  Postop management including wound care, pain control, weightbearing, follow-up per their service.  Foley catheter placed due to urinary retention.    Assessment & Plan:   Left femoral intertrochanteric fracture: - Left hip closed reduction/IM fixation of left hip 1/4.  Postop management including wound care, pain control, weightbearing, follow-up per their service.   Leukocytosis Thrombocytosis:  Likely reactive.  He is afebrile.  Will continue to monitor  Urinary retention - Foley catheter placed, voiding trial 1 week from placement   Uncontrolled insulin -dependent type 2 diabetes Diabetic peripheral neuropathy - Recent A1c of 11.8.  Added long-acting and sliding scale.  Will adjust as appropriate   Depression  On duloxetine     History of cocaine  use  Recommend cessation     Essential hypertension -Slowly resume home p.o. medications when able to tolerate p.o.  IV as needed   Hyperlipidemia  Continue statin   DVT prophylaxis: SCDs Start: 03/13/24 2315    Code Status: Full Code Family Communication:   Status is: Inpatient Remains inpatient appropriate because: SNF placement  PT Follow up Recs: Recommend Snf, Barriers To Snf Placement - Toc To F/U With Patient/Family For D/C Plans1/07/2024 1445  Subjective: Some pain in the left hip.  Still has not had a bowel movement.  Examination:  General exam: Appears calm and comfortable  Respiratory system: Clear to auscultation. Respiratory effort normal. Cardiovascular system: S1 & S2 heard, RRR. No JVD,  murmurs, rubs, gallops or clicks. No pedal edema. Gastrointestinal system: Abdomen is nondistended, soft and nontender. No organomegaly or masses felt. Normal bowel sounds heard. Central nervous system: Alert and oriented. No focal neurological deficits. Extremities: Symmetric 5 x 5 power. Skin: No rashes, lesions or ulcers Psychiatry: Judgement and insight appear normal. Mood & affect appropriate. Catheter in place               Diet Orders (From admission, onward)     Start     Ordered   03/14/24 1206  Diet Heart Room service appropriate? Yes; Fluid consistency: Thin  Diet effective now       Question Answer Comment  Room service appropriate? Yes   Fluid consistency: Thin      03/14/24 1206            Objective: Vitals:   03/16/24 1936 03/16/24 2248 03/17/24 0433 03/17/24 0700  BP: (!) 155/86 136/85 (!) 140/97 (!) 141/92  Pulse: (!) 109 85 86 84  Resp: 18 20 19 18   Temp: 100 F (37.8 C) 98.6 F (37 C) 98.5 F (36.9 C) 98 F (36.7 C)  TempSrc:      SpO2: (!) 89% 95% 95% 96%  Weight:      Height:        Intake/Output Summary (Last 24 hours) at 03/17/2024 1139 Last data filed at 03/17/2024 0809 Gross per 24 hour  Intake 600 ml  Output 2150 ml  Net -1550 ml   Filed Weights   03/15/24 1500  Weight: 68 kg    Scheduled Meds:  acetaminophen   1,000 mg Oral TID   amLODipine   10  mg Oral Daily   aspirin  EC  81 mg Oral BID   atorvastatin   40 mg Oral Daily   bisacodyl   10 mg Oral Q1500   Chlorhexidine  Gluconate Cloth  6 each Topical Daily   docusate sodium   100 mg Oral BID   DULoxetine   60 mg Oral QPM   folic acid   1 mg Oral Daily   gabapentin   600 mg Oral TID   insulin  aspart  0-15 Units Subcutaneous TID WC   insulin  aspart  0-5 Units Subcutaneous QHS   insulin  aspart  5 Units Subcutaneous TID WC   insulin  glargine-yfgn  30 Units Subcutaneous Daily   isosorbide  mononitrate  30 mg Oral Daily   magnesium  citrate  1 Bottle Oral Once   nicotine   21 mg  Transdermal Daily   polyethylene glycol  17 g Oral Daily   Continuous Infusions:  Nutritional status     Body mass index is 22.79 kg/m.  Data Reviewed:   CBC: Recent Labs  Lab 03/13/24 2207 03/14/24 1124 03/15/24 0301  WBC 14.1* 13.5* 11.8*  NEUTROABS 11.6*  --   --   HGB 12.7* 12.9* 11.7*  HCT 39.1 39.5 36.1*  MCV 95.1 93.4 95.0  PLT 487* 485* 449*   Basic Metabolic Panel: Recent Labs  Lab 03/13/24 2207 03/14/24 1124 03/15/24 0301  NA 135 139 134*  K 4.5 4.7 4.1  CL 101 101 99  CO2 22 26 25   GLUCOSE 188* 235* 286*  BUN 16 14 16   CREATININE 1.09 1.10 1.09  CALCIUM  9.3 9.1 8.6*  MG  --   --  1.6*  PHOS  --   --  2.5   GFR: Estimated Creatinine Clearance: 65.9 mL/min (by C-G formula based on SCr of 1.09 mg/dL). Liver Function Tests: No results for input(s): AST, ALT, ALKPHOS, BILITOT, PROT, ALBUMIN in the last 168 hours. No results for input(s): LIPASE, AMYLASE in the last 168 hours. No results for input(s): AMMONIA in the last 168 hours. Coagulation Profile: No results for input(s): INR, PROTIME in the last 168 hours. Cardiac Enzymes: No results for input(s): CKTOTAL, CKMB, CKMBINDEX, TROPONINI in the last 168 hours. BNP (last 3 results) No results for input(s): PROBNP in the last 8760 hours. HbA1C: No results for input(s): HGBA1C in the last 72 hours. CBG: Recent Labs  Lab 03/16/24 1129 03/16/24 1610 03/16/24 2047 03/17/24 0617 03/17/24 1110  GLUCAP 304* 223* 276* 199* 303*   Lipid Profile: No results for input(s): CHOL, HDL, LDLCALC, TRIG, CHOLHDL, LDLDIRECT in the last 72 hours. Thyroid  Function Tests: No results for input(s): TSH, T4TOTAL, FREET4, T3FREE, THYROIDAB in the last 72 hours. Anemia Panel: No results for input(s): VITAMINB12, FOLATE, FERRITIN, TIBC, IRON, RETICCTPCT in the last 72 hours. Sepsis Labs: No results for input(s): PROCALCITON, LATICACIDVEN in the  last 168 hours.  No results found for this or any previous visit (from the past 240 hours).       Radiology Studies: No results found.         LOS: 4 days   Time spent= 35 mins    David JAYSON Dare, MD [facility]  If 7PM-7AM, please contact night-coverage  03/17/2024, 11:39 AM  "

## 2024-03-17 NOTE — Plan of Care (Signed)
" °  Problem: Education: Goal: Ability to describe self-care measures that may prevent or decrease complications (Diabetes Survival Skills Education) will improve Outcome: Progressing   Problem: Coping: Goal: Ability to adjust to condition or change in health will improve Outcome: Progressing   Problem: Nutritional: Goal: Maintenance of adequate nutrition will improve Outcome: Progressing   Problem: Elimination: Goal: Will not experience complications related to bowel motility Outcome: Progressing   Problem: Pain Managment: Goal: General experience of comfort will improve and/or be controlled Outcome: Not Progressing   "

## 2024-03-17 NOTE — TOC Progression Note (Signed)
 Transition of Care [facility]) - Progression Note    Patient Details  Name: David Wyatt MRN: [id number] Date of Birth: [birth year]  [facility]) CM/SW Contact  Bridget Cordella Simmonds, LCSW Phone Number: 03/17/2024, 11:52 AM  Clinical Narrative:   [facility] shows considering bed offer in the hub.  CSW reached out to [facility] to inquire.  1000: Message from Dawn.  They cannot offer bed.  Pt with no current bed offers.     Expected Discharge Plan: Skilled Nursing Facility Barriers to Discharge: Continued Medical Work up, SNF Pending bed offer               Expected Discharge Plan and Services In-house Referral: Clinical Social Work   Post Acute Care Choice: Skilled Nursing Facility Living arrangements for the past 2 months: Homeless Shelter, Homeless ([employer] and on the street) Expected Discharge Date: 03/16/24                                     Social Drivers of Health (SDOH) Interventions SDOH Screenings   Food Insecurity: Food Insecurity Present (03/15/2024)  Housing: High Risk (03/15/2024)  Transportation Needs: Unmet Transportation Needs (03/15/2024)  Utilities: At Risk (03/15/2024)  Alcohol Screen: Low Risk (12/07/2023)  Depression (PHQ2-9): Medium Risk (12/06/2023)  Financial Resource Strain: High Risk (01/15/2024)  Social Connections: Unknown (03/15/2024)  Tobacco Use: Medium Risk (03/14/2024)    Readmission Risk Interventions     No data to display

## 2024-03-18 DIAGNOSIS — S72001D Fracture of unspecified part of neck of right femur, subsequent encounter for closed fracture with routine healing: Secondary | ICD-10-CM | POA: Diagnosis not present

## 2024-03-18 LAB — GLUCOSE, CAPILLARY
Glucose-Capillary: 240 mg/dL — ABNORMAL HIGH (ref 70–99)
Glucose-Capillary: 250 mg/dL — ABNORMAL HIGH (ref 70–99)
Glucose-Capillary: 232 mg/dL — ABNORMAL HIGH (ref 70–99)
Glucose-Capillary: 160 mg/dL — ABNORMAL HIGH (ref 70–99)

## 2024-03-18 MED ORDER — METFORMIN HCL 500 MG PO TABS
1000.0000 mg | ORAL_TABLET | Freq: Two times a day (BID) | ORAL | Status: DC
  Administered 2024-03-18 – 2024-04-12 (×51): 1000 mg via ORAL
  Filled 2024-03-18 (×51): qty 2

## 2024-03-18 NOTE — Evaluation (Signed)
 Occupational Therapy Evaluation Patient Details Name: David Wyatt MRN: [id number] DOB: Mar 07, 1961 Today's Date: 03/18/2024   History of Present Illness   64 y.o. male presents 03/13/24 with L hip pain. Imaging shows displaced intertrochanteric fracture on the left. S/p IM rod L hip 1/4. PMH: DM2, HTN, HLD, cocaine  use, diabetic neuropathy, chronic back pain, arthritis.     Clinical Impressions Pt making incremental progress towards OT goals. Pt remains limited by pain despite premedication but willing to attempt all tasks during therapy session. Focused session on attempts to progress gait in collaboration with PT. With use of adaptive strategies for LLE, pt able to complete bed mobility with Min-Mod A x 2, standing trials with Mod A x 2 using RW but able to progress to Min A x 2 for taking initial steps. Unable to progress much further than 32ft away from bed today d/t ongoing pain. Educated on AROM exercises that can be completed bed level to address LLE strength. Patient will benefit from continued inpatient follow up therapy, <3 hours/day at DC.     If plan is discharge home, recommend the following:   Two people to help with walking and/or transfers;Two people to help with bathing/dressing/bathroom;Assistance with cooking/housework;Direct supervision/assist for medications management;Direct supervision/assist for financial management;Assist for transportation     Functional Status Assessment         Equipment Recommendations   Other (comment) (RW)     Recommendations for Other Services         Precautions/Restrictions   Precautions Precautions: Fall Restrictions Weight Bearing Restrictions Per Provider Order: Yes LLE Weight Bearing Per Provider Order: Weight bearing as tolerated     Mobility Bed Mobility Overal bed mobility: Needs Assistance Bed Mobility: Supine to Sit, Sit to Supine     Supine to sit: Min assist, +2 for physical assistance, +2 for  safety/equipment, HOB elevated, Used rails Sit to supine: Mod assist, +2 for physical assistance, +2 for safety/equipment   General bed mobility comments: improved abilities with use of gait belt as leg lifter for LLE to EOB. Step by step sequencing cues needed to use bedrails, lift trunk and scoot forward. Mod A x 2 for safety in back to bed with cues to lower trunk onto elbows, assist for BLE while attempting to use gait belt as leg lifter    Transfers Overall transfer level: Needs assistance Equipment used: Rolling walker (2 wheels) Transfers: Sit to/from Stand Sit to Stand: Mod assist, +2 physical assistance, +2 safety/equipment           General transfer comment: Mod A x 2 to stand from bedside with two successful attempts. cues for hand placement and tucking bottom in to stand fully upright. Min A x 2 for taking steps forward/backwards at bedside and to L side on second trial. Limited progression due to pain      Balance Overall balance assessment: Needs assistance Sitting-balance support: Bilateral upper extremity supported, Feet supported Sitting balance-Leahy Scale: Fair     Standing balance support: Bilateral upper extremity supported, During functional activity, Reliant on assistive device for balance Standing balance-Leahy Scale: Poor                             ADL either performed or assessed with clinical judgement   ADL Overall ADL's : Needs assistance/impaired Eating/Feeding: Independent                   Lower Body Dressing: Maximal assistance;Sitting/lateral  leans;Sit to/from stand Lower Body Dressing Details (indicate cue type and reason): assist for B socks, limited by pain                     Vision   Vision Assessment?: No apparent visual deficits     Perception         Praxis         Pertinent Vitals/Pain Pain Assessment Pain Assessment: Faces Faces Pain Scale: Hurts even more Pain Location: L hip with  movement Pain Descriptors / Indicators: Grimacing, Guarding, Sore Pain Intervention(s): Monitored during session, Limited activity within patient's tolerance, Premedicated before session     Extremity/Trunk Assessment Upper Extremity Assessment Upper Extremity Assessment: Overall WFL for tasks assessed;Right hand dominant;RUE deficits/detail;LUE deficits/detail RUE Deficits / Details: reports neuropathy in hands LUE Deficits / Details: reports neuropathy in hands   Lower Extremity Assessment Lower Extremity Assessment: Defer to PT evaluation       Communication Communication Communication: No apparent difficulties   Cognition Arousal: Alert Behavior During Therapy: WFL for tasks assessed/performed, Impulsive Cognition: No apparent impairments                               Following commands: Impaired Following commands impaired: Only follows one step commands consistently     Cueing  General Comments   Cueing Techniques: Verbal cues;Visual cues      Exercises     Shoulder Instructions      Home Living                                          Prior Functioning/Environment                      OT Problem List:     OT Treatment/Interventions:        OT Goals(Current goals can be found in the care plan section)   Acute Rehab OT Goals Patient Stated Goal: pain control OT Goal Formulation: With patient Time For Goal Achievement: 03/29/24 Potential to Achieve Goals: Good ADL Goals Pt Will Perform Lower Body Bathing: with contact guard assist;with adaptive equipment Pt Will Perform Lower Body Dressing: with contact guard assist;with adaptive equipment;sitting/lateral leans Pt Will Transfer to Toilet: bedside commode;stand pivot transfer;with contact guard assist Pt/caregiver will Perform Home Exercise Program: Both right and left upper extremity;With written HEP provided;Increased strength Additional ADL Goal #1: Pt will  engage in bed mobility with supervision as a precursor to ADL tasks OOB.   OT Frequency:  Min 2X/week    Co-evaluation PT/OT/SLP Co-Evaluation/Treatment: Yes Reason for Co-Treatment: Complexity of the patient's impairments (multi-system involvement);For patient/therapist safety;To address functional/ADL transfers PT goals addressed during session: Mobility/safety with mobility;Proper use of DME OT goals addressed during session: ADL's and self-care;Proper use of Adaptive equipment and DME;Strengthening/ROM      AM-PAC OT 6 Clicks Daily Activity     Outcome Measure Help from another person eating meals?: None Help from another person taking care of personal grooming?: A Little Help from another person toileting, which includes using toliet, bedpan, or urinal?: A Lot Help from another person bathing (including washing, rinsing, drying)?: A Lot Help from another person to put on and taking off regular upper body clothing?: A Little Help from another person to put on and taking off regular lower body clothing?:  A Lot 6 Click Score: 16   End of Session Equipment Utilized During Treatment: Gait belt;Rolling walker (2 wheels) Nurse Communication: Mobility status  Activity Tolerance: Patient tolerated treatment well;Patient limited by pain Patient left: in bed;with call bell/phone within reach;with bed alarm set  OT Visit Diagnosis: Unsteadiness on feet (R26.81);Muscle weakness (generalized) (M62.81);History of falling (Z91.81);Pain Pain - Right/Left: Left Pain - part of body: Hip                Time: 8977-8942 OT Time Calculation (min): 35 min Charges:  OT General Charges $OT Visit: 1 Visit OT Treatments $Therapeutic Activity: 8-22 mins  Mliss NOVAK, OTR/L Acute Rehab Services Office: [phone]   Mliss Fish 03/18/2024, 11:10 AM

## 2024-03-18 NOTE — Progress Notes (Signed)
 Physical Therapy Treatment Patient Details Name: David Wyatt MRN: [id number] DOB: 1960/08/22 Today's Date: 03/18/2024   History of Present Illness 64 y.o. male presents 03/13/24 with L hip pain. Imaging shows displaced intertrochanteric fracture on the left. S/p IM rod L hip 1/4. PMH: DM2, HTN, HLD, cocaine  use, diabetic neuropathy, chronic back pain, arthritis.    PT Comments  Pt seen for co-treatment with OT. Received pt semi-reclined in bed and hesitant but eventually agreed to OOB mobility. Pt required min/mod A +2 for bed mobility with use of bed features and using gait belt as leg lifter with cues for sequencing/technique. Pt stood from elevated EOB x 2 trials with mod A +2 and able to take 2 steps forward, then 2 steps backwards with min A +2 - cues for technique to offload LLE as pt unable to tolerate placing weight through. Pt then performed 3 L side steps to [facility] with min A +2 and same cues. Pt required increased time with mobility due to pain. Continue to recommend follow up therapy <3 hours/day to address current impairments. Acute PT to cont to follow.    If plan is discharge home, recommend the following: Two people to help with walking and/or transfers;Two people to help with bathing/dressing/bathroom;Assist for transportation;Assistance with cooking/housework;Help with stairs or ramp for entrance   Can travel by private vehicle     No  Equipment Recommendations  Rolling walker (2 wheels);BSC/3in1    Recommendations for Other Services       Precautions / Restrictions Precautions Precautions: Fall Restrictions Weight Bearing Restrictions Per Provider Order: Yes LLE Weight Bearing Per Provider Order: Weight bearing as tolerated     Mobility  Bed Mobility Overal bed mobility: Needs Assistance Bed Mobility: Supine to Sit, Sit to Supine, Rolling     Supine to sit: Min assist, +2 for physical assistance, +2 for safety/equipment, HOB elevated, Used rails Sit to supine: Mod  assist, +2 for physical assistance, +2 for safety/equipment   General bed mobility comments: initally required assist to advance LLE off EOB, but improved ability with use of gait belt as leg lifter for LLE. Step by step sequencing cues needed to use bedrails, lift trunk, and scooting forward. Mod A x 2 for safety in back to bed with cues to lower trunk onto elbows, assist for BLE while attempting to use gait belt as leg lifter. Pt required ++ time and multiple rest breaks due to pain. Patient Response: Cooperative  Transfers Overall transfer level: Needs assistance Equipment used: Rolling walker (2 wheels) Transfers: Sit to/from Stand Sit to Stand: Mod assist, +2 physical assistance, +2 safety/equipment, From elevated surface           General transfer comment: Mod A x 2 to stand from bedside with two successful attempts. Cues for hand placement and tucking bottom in to stand fully upright. Min A x 2 for taking steps forward/backwards at bedside and for L side stepping to [facility] on second trial. Limited progression due to pain    Ambulation/Gait Ambulation/Gait assistance: Min assist, +2 physical assistance Gait Distance (Feet): 3 Feet Assistive device: Rolling walker (2 wheels) Gait Pattern/deviations: Step-to pattern, Decreased step length - right, Decreased stance time - left, Decreased stride length, Decreased step length - left, Decreased weight shift to left, Antalgic, Trunk flexed, Narrow base of support Gait velocity: significantly decreased Gait velocity interpretation: <1.31 ft/sec, indicative of household ambulator       Stairs  Wheelchair Mobility     Tilt Bed Tilt Bed Patient Response: Cooperative  Modified Rankin (Stroke Patients Only)       Balance Overall balance assessment: Needs assistance Sitting-balance support: Bilateral upper extremity supported, Feet supported Sitting balance-Leahy Scale: Fair Sitting balance - Comments: able to  maintain static sitting balance with CGA/close supervision. Offloading L hip due to pain.   Standing balance support: Bilateral upper extremity supported, During functional activity, Reliant on assistive device for balance (RW) Standing balance-Leahy Scale: Poor Standing balance comment: heavy reliance on BUE support on RW                            Communication Communication Communication: No apparent difficulties  Cognition Arousal: Alert Behavior During Therapy: WFL for tasks assessed/performed, Impulsive                           PT - Cognition Comments: cooperative and motivated; required ++ time Following commands: Impaired Following commands impaired: Only follows one step commands consistently    Cueing Cueing Techniques: Verbal cues, Visual cues  Exercises      General Comments        Pertinent Vitals/Pain Pain Assessment Pain Assessment: Faces Faces Pain Scale: Hurts whole lot Pain Location: L hip with movement Pain Descriptors / Indicators: Grimacing, Guarding, Sore Pain Intervention(s): Limited activity within patient's tolerance, Monitored during session, Premedicated before session, Repositioned    Home Living                          Prior Function            PT Goals (current goals can now be found in the care plan section) Acute Rehab PT Goals Patient Stated Goal: Did not state PT Goal Formulation: Patient unable to participate in goal setting Time For Goal Achievement: 03/29/24 Potential to Achieve Goals: Good Progress towards PT goals: Progressing toward goals    Frequency    Min 2X/week      PT Plan      Co-evaluation   Reason for Co-Treatment: Complexity of the patient's impairments (multi-system involvement);For patient/therapist safety;To address functional/ADL transfers PT goals addressed during session: Mobility/safety with mobility;Proper use of DME;Balance OT goals addressed during session:  ADL's and self-care;Proper use of Adaptive equipment and DME;Strengthening/ROM      AM-PAC PT 6 Clicks Mobility   Outcome Measure  Help needed turning from your back to your side while in a flat bed without using bedrails?: A Lot Help needed moving from lying on your back to sitting on the side of a flat bed without using bedrails?: A Lot Help needed moving to and from a bed to a chair (including a wheelchair)?: Total Help needed standing up from a chair using your arms (e.g., wheelchair or bedside chair)?: Total Help needed to walk in hospital room?: Total Help needed climbing 3-5 steps with a railing? : Total 6 Click Score: 8    End of Session Equipment Utilized During Treatment: Gait belt Activity Tolerance: Patient limited by pain Patient left: with call bell/phone within reach;in bed;with bed alarm set Nurse Communication: Mobility status PT Visit Diagnosis: Unsteadiness on feet (R26.81);Pain;Other abnormalities of gait and mobility (R26.89);Muscle weakness (generalized) (M62.81) Pain - Right/Left: Left Pain - part of body: Hip     Time: 8976-8942 PT Time Calculation (min) (ACUTE ONLY): 34 min  Charges:    $  Therapeutic Activity: 8-22 mins PT General Charges $$ ACUTE PT VISIT: 1 Visit                     Therisa Stains PT, DPT Therisa HERO Zaunegger 03/18/2024, 12:14 PM

## 2024-03-18 NOTE — Plan of Care (Signed)
°  Problem: Metabolic: Goal: Ability to maintain appropriate glucose levels will improve Outcome: Progressing   Problem: Nutritional: Goal: Maintenance of adequate nutrition will improve Outcome: Progressing   Problem: Clinical Measurements: Goal: Ability to maintain clinical measurements within normal limits will improve Outcome: Progressing   Problem: Activity: Goal: Risk for activity intolerance will decrease Outcome: Progressing   Problem: Elimination: Goal: Will not experience complications related to bowel motility Outcome: Progressing Goal: Will not experience complications related to urinary retention Outcome: Progressing   Problem: Pain Managment: Goal: General experience of comfort will improve and/or be controlled Outcome: Progressing

## 2024-03-18 NOTE — Progress Notes (Signed)
 " PROGRESS NOTE    David Wyatt  FMW:[id number] DOB: [birth year] DOA: 03/13/2024 PCP: Delbert Clam, MD    Brief Narrative:   64 year old with history of uncontrolled DM2, HTN, HLD, cocaine  use, diabetic neuropathy, chronic back pain, arthritis comes to the ED after a fall leading to left hip pain.  X-ray showed left-sided hip fracture, orthopedic consulted.  Left hip closed reduction/IM fixation of left hip 1/4.  Postop management including wound care, pain control, weightbearing, follow-up per their service.  Foley catheter placed due to urinary retention.  Currently awaiting SNF placement.    Assessment & Plan:   Left femoral intertrochanteric fracture: - Left hip closed reduction/IM fixation of left hip 1/4.  Postop management including wound care, pain control, weightbearing, follow-up per their service.   Leukocytosis Thrombocytosis:  Likely reactive.  He is afebrile.  Will continue to monitor  Urinary retention - Foley catheter placed, voiding trial 1 week from placement > 03/20/24   Uncontrolled insulin -dependent type 2 diabetes Diabetic peripheral neuropathy - Recent A1c of 11.8.  Added long-acting and sliding scale.  Will adjust as appropriate. Add back his home po metformin .    Depression  On duloxetine     History of cocaine  use  Recommend cessation     Essential hypertension -Slowly resume home p.o. medications when able to tolerate p.o.  IV as needed   Hyperlipidemia  Continue statin   DVT prophylaxis: SCDs    Code Status: Full Code Family Communication:   Status is: Inpatient Remains inpatient appropriate because: SNF placement  PT Follow up Recs: Recommend Snf, Barriers To Snf Placement - Toc To F/U With Patient/Family For D/C Plans1/07/2024 1445  Subjective: Seen at bedside.  Occasional moments of confusion.  Reporting of left hip pain as expected Had bowel movement overnight and this morning  Examination:  General exam: Appears calm and  comfortable  Respiratory system: Clear to auscultation. Respiratory effort normal. Cardiovascular system: S1 & S2 heard, RRR. No JVD, murmurs, rubs, gallops or clicks. No pedal edema. Gastrointestinal system: Abdomen is nondistended, soft and nontender. No organomegaly or masses felt. Normal bowel sounds heard. Central nervous system: Alert and oriented. No focal neurological deficits. Extremities: Symmetric 5 x 5 power. Skin: No rashes, lesions or ulcers Psychiatry: Judgement and insight appear normal. Mood & affect appropriate. Catheter in place               Diet Orders (From admission, onward)     Start     Ordered   03/17/24 1141  Diet Carb Modified Room service appropriate? Yes  Diet effective now       Question Answer Comment  Diet-HS Snack? Nothing   Calorie Level Medium 1600-2000   Fluid consistency: Thin   Room service appropriate? Yes      03/17/24 1140            Objective: Vitals:   03/17/24 1300 03/17/24 2202 03/18/24 0540 03/18/24 0804  BP: (!) 140/88 (!) 141/91 (!) 148/91 (!) 152/78  Pulse: 88 98 89 82  Resp: 18 20 20 17   Temp: 98.3 F (36.8 C) 98.1 F (36.7 C) 98 F (36.7 C) 97.6 F (36.4 C)  TempSrc:  Oral Oral   SpO2: 96% 97% 97% 93%  Weight:      Height:        Intake/Output Summary (Last 24 hours) at 03/18/2024 1141 Last data filed at 03/18/2024 1130 Gross per 24 hour  Intake 1200 ml  Output 1800 ml  Net -600 ml  Filed Weights   03/15/24 1500  Weight: 68 kg    Scheduled Meds:  acetaminophen   1,000 mg Oral TID   amLODipine   10 mg Oral Daily   aspirin  EC  81 mg Oral BID   atorvastatin   40 mg Oral Daily   bisacodyl   10 mg Oral Q1500   Chlorhexidine  Gluconate Cloth  6 each Topical Daily   docusate sodium   100 mg Oral BID   DULoxetine   60 mg Oral QPM   folic acid   1 mg Oral Daily   gabapentin   600 mg Oral TID   insulin  aspart  0-15 Units Subcutaneous TID WC   insulin  aspart  0-5 Units Subcutaneous QHS   insulin  aspart  5  Units Subcutaneous TID WC   insulin  glargine-yfgn  30 Units Subcutaneous Daily   isosorbide  mononitrate  30 mg Oral Daily   lactulose   20 g Oral BID   metFORMIN   1,000 mg Oral BID WC   nicotine   21 mg Transdermal Daily   polyethylene glycol  17 g Oral Daily   Continuous Infusions:  Nutritional status     Body mass index is 22.79 kg/m.  Data Reviewed:   CBC: Recent Labs  Lab 03/13/24 2207 03/14/24 1124 03/15/24 0301  WBC 14.1* 13.5* 11.8*  NEUTROABS 11.6*  --   --   HGB 12.7* 12.9* 11.7*  HCT 39.1 39.5 36.1*  MCV 95.1 93.4 95.0  PLT 487* 485* 449*   Basic Metabolic Panel: Recent Labs  Lab 03/13/24 2207 03/14/24 1124 03/15/24 0301  NA 135 139 134*  K 4.5 4.7 4.1  CL 101 101 99  CO2 22 26 25   GLUCOSE 188* 235* 286*  BUN 16 14 16   CREATININE 1.09 1.10 1.09  CALCIUM  9.3 9.1 8.6*  MG  --   --  1.6*  PHOS  --   --  2.5   GFR: Estimated Creatinine Clearance: 65.9 mL/min (by C-G formula based on SCr of 1.09 mg/dL). Liver Function Tests: No results for input(s): AST, ALT, ALKPHOS, BILITOT, PROT, ALBUMIN in the last 168 hours. No results for input(s): LIPASE, AMYLASE in the last 168 hours. No results for input(s): AMMONIA in the last 168 hours. Coagulation Profile: No results for input(s): INR, PROTIME in the last 168 hours. Cardiac Enzymes: No results for input(s): CKTOTAL, CKMB, CKMBINDEX, TROPONINI in the last 168 hours. BNP (last 3 results) No results for input(s): PROBNP in the last 8760 hours. HbA1C: No results for input(s): HGBA1C in the last 72 hours. CBG: Recent Labs  Lab 03/17/24 0617 03/17/24 1110 03/17/24 1605 03/17/24 2112 03/18/24 0620  GLUCAP 199* 303* 265* 99 240*   Lipid Profile: No results for input(s): CHOL, HDL, LDLCALC, TRIG, CHOLHDL, LDLDIRECT in the last 72 hours. Thyroid  Function Tests: No results for input(s): TSH, T4TOTAL, FREET4, T3FREE, THYROIDAB in the last 72  hours. Anemia Panel: No results for input(s): VITAMINB12, FOLATE, FERRITIN, TIBC, IRON, RETICCTPCT in the last 72 hours. Sepsis Labs: No results for input(s): PROCALCITON, LATICACIDVEN in the last 168 hours.  No results found for this or any previous visit (from the past 240 hours).       Radiology Studies: No results found.         LOS: 5 days   Time spent= 35 mins    Burgess JAYSON Dare, MD [facility]  If 7PM-7AM, please contact night-coverage  03/18/2024, 11:41 AM  "

## 2024-03-19 DIAGNOSIS — S72142A Displaced intertrochanteric fracture of left femur, initial encounter for closed fracture: Secondary | ICD-10-CM | POA: Diagnosis not present

## 2024-03-19 LAB — CBC WITH DIFFERENTIAL/PLATELET
Abs Immature Granulocytes: 0.1 K/uL — ABNORMAL HIGH (ref 0.00–0.07)
Basophils Absolute: 0.1 K/uL (ref 0.0–0.1)
Basophils Relative: 1 %
Eosinophils Absolute: 0.6 K/uL — ABNORMAL HIGH (ref 0.0–0.5)
Eosinophils Relative: 5 %
HCT: 37.9 % — ABNORMAL LOW (ref 39.0–52.0)
Hemoglobin: 12.5 g/dL — ABNORMAL LOW (ref 13.0–17.0)
Immature Granulocytes: 1 %
Lymphocytes Relative: 16 %
Lymphs Abs: 1.9 K/uL (ref 0.7–4.0)
MCH: 30.6 pg (ref 26.0–34.0)
MCHC: 33 g/dL (ref 30.0–36.0)
MCV: 92.7 fL (ref 80.0–100.0)
Monocytes Absolute: 0.7 K/uL (ref 0.1–1.0)
Monocytes Relative: 6 %
Neutro Abs: 8.4 K/uL — ABNORMAL HIGH (ref 1.7–7.7)
Neutrophils Relative %: 71 %
Platelets: 685 K/uL — ABNORMAL HIGH (ref 150–400)
RBC: 4.09 MIL/uL — ABNORMAL LOW (ref 4.22–5.81)
RDW: 13.6 % (ref 11.5–15.5)
WBC: 11.8 K/uL — ABNORMAL HIGH (ref 4.0–10.5)
nRBC: 0 % (ref 0.0–0.2)

## 2024-03-19 LAB — COMPREHENSIVE METABOLIC PANEL WITH GFR
ALT: 28 U/L (ref 0–44)
AST: 43 U/L — ABNORMAL HIGH (ref 15–41)
Albumin: 3.5 g/dL (ref 3.5–5.0)
Alkaline Phosphatase: 96 U/L (ref 38–126)
Anion gap: 12 (ref 5–15)
BUN: 20 mg/dL (ref 8–23)
CO2: 26 mmol/L (ref 22–32)
Calcium: 10.1 mg/dL (ref 8.9–10.3)
Chloride: 96 mmol/L — ABNORMAL LOW (ref 98–111)
Creatinine, Ser: 1.02 mg/dL (ref 0.61–1.24)
GFR, Estimated: >60 mL/min
Glucose, Bld: 232 mg/dL — ABNORMAL HIGH (ref 70–99)
Potassium: 4.9 mmol/L (ref 3.5–5.1)
Sodium: 134 mmol/L — ABNORMAL LOW (ref 135–145)
Total Bilirubin: 0.7 mg/dL (ref 0.0–1.2)
Total Protein: 7.9 g/dL (ref 6.5–8.1)

## 2024-03-19 LAB — PHOSPHORUS: Phosphorus: 3.7 mg/dL (ref 2.5–4.6)

## 2024-03-19 LAB — GLUCOSE, CAPILLARY
Glucose-Capillary: 105 mg/dL — ABNORMAL HIGH (ref 70–99)
Glucose-Capillary: 130 mg/dL — ABNORMAL HIGH (ref 70–99)
Glucose-Capillary: 300 mg/dL — ABNORMAL HIGH (ref 70–99)
Glucose-Capillary: 252 mg/dL — ABNORMAL HIGH (ref 70–99)
Glucose-Capillary: 108 mg/dL — ABNORMAL HIGH (ref 70–99)

## 2024-03-19 LAB — MAGNESIUM: Magnesium: 1.8 mg/dL (ref 1.7–2.4)

## 2024-03-19 MED ORDER — METHOCARBAMOL 500 MG PO TABS
500.0000 mg | ORAL_TABLET | Freq: Four times a day (QID) | ORAL | Status: DC | PRN
  Administered 2024-03-19 – 2024-03-23 (×9): 500 mg via ORAL
  Filled 2024-03-19 (×9): qty 1

## 2024-03-19 MED ORDER — MAGNESIUM SULFATE 2 GM/50ML IV SOLN
2.0000 g | Freq: Once | INTRAVENOUS | Status: AC
  Administered 2024-03-19: 2 g via INTRAVENOUS
  Filled 2024-03-19: qty 50

## 2024-03-19 MED ORDER — TAMSULOSIN HCL 0.4 MG PO CAPS
0.4000 mg | ORAL_CAPSULE | Freq: Every day | ORAL | Status: DC
  Administered 2024-03-19 – 2024-04-12 (×25): 0.4 mg via ORAL
  Filled 2024-03-19 (×25): qty 1

## 2024-03-19 MED ORDER — DIPHENHYDRAMINE HCL 25 MG PO CAPS: 25.0000 mg | ORAL_CAPSULE | Freq: Every evening | ORAL | Status: DC | PRN

## 2024-03-19 MED ORDER — HYDROMORPHONE HCL 1 MG/ML IJ SOLN
0.5000 mg | INTRAMUSCULAR | Status: DC | PRN
  Administered 2024-03-19 – 2024-03-26 (×13): 0.5 mg via INTRAVENOUS
  Filled 2024-03-19 (×15): qty 0.5

## 2024-03-19 NOTE — Progress Notes (Signed)
 " PROGRESS NOTE    David Wyatt  FMW:[id number] DOB: [birth year] DOA: 03/13/2024 PCP: Delbert Clam, MD   Brief Narrative:  The patient is a 64 year old with history of uncontrolled DM2, HTN, HLD, cocaine  use, diabetic neuropathy, chronic back pain, arthritis comes to the ED after a fall leading to left hip pain.  X-ray showed left-sided hip fracture, orthopedic consulted.  Left hip closed reduction/IM fixation of left hip 1/4.  Postop management including wound care, pain control, weightbearing, follow-up per their service.  Foley catheter placed due to urinary retention.  Currently awaiting SNF placement but has no safe discharge disposition as he has lack of bed offers.   Assessment and Plan:  Left femoral intertrochanteric fracture: - Left hip closed reduction/IM fixation of left hip 1/4.  Postop management including wound care, pain control, weightbearing, follow-up per their service.  Continues to have significant pain and will continue acetaminophen  1000 mg p.o. 3 times daily as well as 650 mg p.o. every 6 as needed for mild pain and oxycodone  5 to 10 mg p.o. every 4 as needed for moderate pain.  VTE prophylaxis with aspirin  81 mg p.o. twice daily per the orthopedic surgeon.  Continue bowel regimen with docusate 100 mg p.o. twice daily, MiraLAX  17 g p.o. daily and senna docusate 1 tab p.o. nightly as needed for moderate constipation.  Will add the carbamoyl 500 mg p.o. every 6 as needed for muscle spasms.  PT OT recommending SNF but has no safe discharge disposition at this time   Leukocytosis / Thrombocytosis: Likely reactive.  He is afebrile.  WBC and Plt Count Trend:  Recent Labs  Lab 03/07/24 0230 03/07/24 0532 03/08/24 0150 03/13/24 2207 03/14/24 1124 03/15/24 0301 03/19/24 0944  WBC 8.4 7.7 5.6 14.1* 13.5* 11.8* 11.8*  PLT 309 290 314 487* 485* 449* 685*  -CTM and Trend and repeat in the AM  Urinary Retention: Foley catheter placed, voiding trial 1 week from placement > 03/20/24  and will attempt in the AM.  Initiate tamsulosin    Uncontrolled insulin -dependent type 2 diabetes Diabetic peripheral neuropathy - Recent A1c of 11.8 continue with Semglee  30 units subcu daily, moderate NovoLog  sign scale insulin  AC and at bedtime and metformin  1000 mg p.o. twice daily - Continue with gabapentin  600 mg p.o. 3 times daily continue with glucagon  1 mg IV as needed for low blood sugar  Hyponatremia: Mild and Fluctuating. Na+ Trend:  Recent Labs  Lab 03/07/24 0230 03/07/24 0314 03/08/24 0150 03/13/24 2207 03/14/24 1124 03/15/24 0301 03/19/24 0944  NA 136 137 134* 135 139 134* 134*  -Repeat CMP in the AM    Essential Hypertension: Continue with Imdur  30 mg p.o. daily.  Continue with IV hydralazine  10 mg Q6 as needed for high blood pressure.  To monitor blood pressure per protocol.  Last blood pressure reading was 141/82   Depression: On Duloxetine  60 mg p.o. every evening   History of cocaine  use:  Recommend cessation    Hyperlipidemia: Continue Atorvastatin  40 mg p.o. Daily   Tobacco Abuse: Smoking cessation counseling given.  Continue with Nicotine  Patch 21 mg TD   DVT prophylaxis: SCDs Start: 03/13/24 2315    Code Status: Full Code Family Communication: No family currently at bedside  Disposition Plan:  Level of care: Med-Surg Status is: Inpatient Remains inpatient appropriate because: Awaiting SNF placement and has no bed offers   Consultants:  Orthopedic Surgery  Procedures:  As delineated as above  Antimicrobials:  Anti-infectives (From admission, onward)  None       Subjective: Seen and examined at bedside he is writhing in pain and continues to have significant pain in his left hip.  No nausea or vomiting.  Did not feel very well.  Only really complained about pain and nothing else.  No other concerns or complaints at this time.  Objective: Vitals:   03/18/24 2005 03/19/24 0328 03/19/24 0817 03/19/24 1548  BP: (!) 137/92 (!) 159/93 (!)  150/84 (!) 141/82  Pulse: 99 89 96 93  Resp: 18 19 17 19   Temp: 98.3 F (36.8 C) 97.9 F (36.6 C) 97.8 F (36.6 C) 98.5 F (36.9 C)  TempSrc: Oral Oral    SpO2: 94% 94% 96% 95%  Weight:      Height:        Intake/Output Summary (Last 24 hours) at 03/19/2024 1921 Last data filed at 03/19/2024 1855 Gross per 24 hour  Intake 360 ml  Output 2325 ml  Net -1965 ml   Filed Weights   03/15/24 1500  Weight: 68 kg   Examination: Physical Exam:  Constitutional: Chronically ill-appearing African-American male who appears uncomfortable Respiratory: Diminished to auscultation bilaterally, no wheezing, rales, rhonchi or crackles. Normal respiratory effort and patient is not tachypenic. No accessory muscle use.  Unlabored breathing Cardiovascular: RRR, no murmurs / rubs / gallops. S1 and S2 auscultated. No extremity edema. Abdomen: Soft, non-tender, non-distended. Bowel sounds positive.  GU: Deferred. Musculoskeletal: No clubbing / cyanosis of digits/nails. No joint deformity upper and lower extremities. Skin: No rashes, lesions, ulcers on a limited skin evaluation. No induration; Warm and dry.  Neurologic: CN 2-12 grossly intact with no focal deficits. Romberg sign cerebellar reflexes not assessed.  Psychiatric: A Little agitated and frustrated and appears anxious given the amount of pain he is having  Data Reviewed: I have personally reviewed following labs and imaging studies  CBC: Recent Labs  Lab 03/13/24 2207 03/14/24 1124 03/15/24 0301 03/19/24 0944  WBC 14.1* 13.5* 11.8* 11.8*  NEUTROABS 11.6*  --   --  8.4*  HGB 12.7* 12.9* 11.7* 12.5*  HCT 39.1 39.5 36.1* 37.9*  MCV 95.1 93.4 95.0 92.7  PLT 487* 485* 449* 685*   Basic Metabolic Panel: Recent Labs  Lab 03/13/24 2207 03/14/24 1124 03/15/24 0301 03/19/24 0944  NA 135 139 134* 134*  K 4.5 4.7 4.1 4.9  CL 101 101 99 96*  CO2 22 26 25 26   GLUCOSE 188* 235* 286* 232*  BUN 16 14 16 20   CREATININE 1.09 1.10 1.09 1.02   CALCIUM  9.3 9.1 8.6* 10.1  MG  --   --  1.6* 1.8  PHOS  --   --  2.5 3.7   GFR: Estimated Creatinine Clearance: 70.4 mL/min (by C-G formula based on SCr of 1.02 mg/dL). Liver Function Tests: Recent Labs  Lab 03/19/24 0944  AST 43*  ALT 28  ALKPHOS 96  BILITOT 0.7  PROT 7.9  ALBUMIN 3.5   No results for input(s): LIPASE, AMYLASE in the last 168 hours. No results for input(s): AMMONIA in the last 168 hours. Coagulation Profile: No results for input(s): INR, PROTIME in the last 168 hours. Cardiac Enzymes: No results for input(s): CKTOTAL, CKMB, CKMBINDEX, TROPONINI in the last 168 hours. BNP (last 3 results) No results for input(s): PROBNP in the last 8760 hours. HbA1C: No results for input(s): HGBA1C in the last 72 hours. CBG: Recent Labs  Lab 03/18/24 2114 03/19/24 0323 03/19/24 0621 03/19/24 1115 03/19/24 1723  GLUCAP 160* 105* 130*  300* 252*   Lipid Profile: No results for input(s): CHOL, HDL, LDLCALC, TRIG, CHOLHDL, LDLDIRECT in the last 72 hours. Thyroid  Function Tests: No results for input(s): TSH, T4TOTAL, FREET4, T3FREE, THYROIDAB in the last 72 hours. Anemia Panel: No results for input(s): VITAMINB12, FOLATE, FERRITIN, TIBC, IRON, RETICCTPCT in the last 72 hours. Sepsis Labs: No results for input(s): PROCALCITON, LATICACIDVEN in the last 168 hours.  No results found for this or any previous visit (from the past 240 hours).   Radiology Studies: No results found.  Scheduled Meds:  acetaminophen   1,000 mg Oral TID   amLODipine   10 mg Oral Daily   aspirin  EC  81 mg Oral BID   atorvastatin   40 mg Oral Daily   bisacodyl   10 mg Oral Q1500   Chlorhexidine  Gluconate Cloth  6 each Topical Daily   docusate sodium   100 mg Oral BID   DULoxetine   60 mg Oral QPM   folic acid   1 mg Oral Daily   gabapentin   600 mg Oral TID   insulin  aspart  0-15 Units Subcutaneous TID WC   insulin  aspart  0-5 Units  Subcutaneous QHS   insulin  aspart  5 Units Subcutaneous TID WC   insulin  glargine-yfgn  30 Units Subcutaneous Daily   isosorbide  mononitrate  30 mg Oral Daily   lactulose   20 g Oral BID   metFORMIN   1,000 mg Oral BID WC   nicotine   21 mg Transdermal Daily   polyethylene glycol  17 g Oral Daily   tamsulosin   0.4 mg Oral Daily   Continuous Infusions:  magnesium  sulfate bolus IVPB      LOS: 6 days   Alejandro Marker, DO [facility] Available via Epic secure chat 7am-7pm After these hours, please refer to coverage provider listed on [email or web address] 03/19/2024, 7:21 PM  "

## 2024-03-19 NOTE — Plan of Care (Signed)
" °  Problem: Education: Goal: Ability to describe self-care measures that may prevent or decrease complications (Diabetes Survival Skills Education) will improve 03/19/2024 0435 by Cornelia Gear, RN Outcome: Progressing 03/19/2024 0434 by Cornelia Gear, RN Outcome: Progressing   Problem: Coping: Goal: Ability to adjust to condition or change in health will improve 03/19/2024 0435 by Cornelia Gear, RN Outcome: Progressing 03/19/2024 0434 by Cornelia Gear, RN Outcome: Progressing   Problem: Clinical Measurements: Goal: Will remain free from infection Outcome: Progressing   Problem: Elimination: Goal: Will not experience complications related to bowel motility Outcome: Progressing   Problem: Pain Managment: Goal: General experience of comfort will improve and/or be controlled 03/19/2024 0435 by Cornelia Gear, RN Outcome: Progressing 03/19/2024 0434 by Cornelia Gear, RN Outcome: Progressing   "

## 2024-03-19 NOTE — TOC Progression Note (Signed)
 Transition of Care [facility]) - Progression Note    Patient Details  Name: David Wyatt MRN: [id number] Date of Birth: 1960/06/19  [facility] CM/SW Contact  Bridget Cordella Simmonds, LCSW Phone Number: 03/19/2024, 2:05 PM  Clinical Narrative:   CSW continues to have no SNF bed offers.  CSW reached out to [facility] and [facility] to request they review referral.  [facility] and homelessness continue to be barriers.  CSW spoke with [name] regarding DC plan.  She is in [place] but travels regularly, cannot have pt stay in her home when she is not there.  She will see if she can think of any other options of people who could assist.  CSW spoke with pt, updated him that no bed offers.  Pt reports that he is from [place]  but has no family left there. Sister is only family.  Discussed if pt has any supports in town who may be able to offer him a place to stay.  Pt reports he has none.  CSW left message with [facility] [name].  Pt had reported he stayed there prior to admission.  Asked for call back about what point pt could potentially return.      Expected Discharge Plan: Skilled Nursing Facility Barriers to Discharge: Continued Medical Work up, SNF Pending bed offer               Expected Discharge Plan and Services In-house Referral: Clinical Social Work   Post Acute Care Choice: Skilled Nursing Facility Living arrangements for the past 2 months: Homeless Shelter, Homeless ([employer] and on the street) Expected Discharge Date: 03/16/24                                     Social Drivers of Health (SDOH) Interventions SDOH Screenings   Food Insecurity: Food Insecurity Present (03/15/2024)  Housing: High Risk (03/15/2024)  Transportation Needs: Unmet Transportation Needs (03/15/2024)  Utilities: At Risk (03/15/2024)  Alcohol Screen: Low Risk (12/07/2023)  Depression (PHQ2-9): Medium Risk (12/06/2023)  Financial Resource Strain:  High Risk (01/15/2024)  Social Connections: Unknown (03/15/2024)  Tobacco Use: Medium Risk (03/14/2024)    Readmission Risk Interventions     No data to display

## 2024-03-19 NOTE — Progress Notes (Signed)
 Mobility Specialist Progress Note:    03/19/24 1114  Mobility  Activity Pivoted/transferred to/from BSC  Level of Assistance Moderate assist, patient does 50-74% (+2)  Assistive Device Front wheel walker  Distance Ambulated (ft) 6 ft  LLE Weight Bearing Per Provider Order WBAT  Activity Response Tolerated well  Mobility Referral Yes  Mobility visit 1 Mobility  Mobility Specialist Start Time (ACUTE ONLY) 1100  Mobility Specialist Stop Time (ACUTE ONLY) 1114  Mobility Specialist Time Calculation (min) (ACUTE ONLY) 14 min   Received pt in chair requesting assistance to [facility]. Pt required ModA +2 STS and VC for RW management. Pt had small BM and MS assisted with pericare. Assisted pt to chair with alarm on. Personal belongings and call light within reach. All needs met. NT present.  Lavanda Pollack Mobility Specialist  Please contact via [employer] or  Rehab Office [phone]

## 2024-03-20 DIAGNOSIS — S72142A Displaced intertrochanteric fracture of left femur, initial encounter for closed fracture: Secondary | ICD-10-CM | POA: Diagnosis not present

## 2024-03-20 LAB — CBC WITH DIFFERENTIAL/PLATELET
Abs Immature Granulocytes: 0.09 K/uL — ABNORMAL HIGH (ref 0.00–0.07)
Basophils Absolute: 0.1 K/uL (ref 0.0–0.1)
Basophils Relative: 1 %
Eosinophils Absolute: 0.4 K/uL (ref 0.0–0.5)
Eosinophils Relative: 4 %
HCT: 35.1 % — ABNORMAL LOW (ref 39.0–52.0)
Hemoglobin: 11.6 g/dL — ABNORMAL LOW (ref 13.0–17.0)
Immature Granulocytes: 1 %
Lymphocytes Relative: 19 %
Lymphs Abs: 2 K/uL (ref 0.7–4.0)
MCH: 30.7 pg (ref 26.0–34.0)
MCHC: 33 g/dL (ref 30.0–36.0)
MCV: 92.9 fL (ref 80.0–100.0)
Monocytes Absolute: 0.6 K/uL (ref 0.1–1.0)
Monocytes Relative: 6 %
Neutro Abs: 7 K/uL (ref 1.7–7.7)
Neutrophils Relative %: 69 %
Platelets: 648 K/uL — ABNORMAL HIGH (ref 150–400)
RBC: 3.78 MIL/uL — ABNORMAL LOW (ref 4.22–5.81)
RDW: 13.5 % (ref 11.5–15.5)
WBC: 10.1 K/uL (ref 4.0–10.5)
nRBC: 0 % (ref 0.0–0.2)

## 2024-03-20 LAB — COMPREHENSIVE METABOLIC PANEL WITH GFR
ALT: 25 U/L (ref 0–44)
AST: 31 U/L (ref 15–41)
Albumin: 3.4 g/dL — ABNORMAL LOW (ref 3.5–5.0)
Alkaline Phosphatase: 92 U/L (ref 38–126)
Anion gap: 10 (ref 5–15)
BUN: 22 mg/dL (ref 8–23)
CO2: 27 mmol/L (ref 22–32)
Calcium: 9.8 mg/dL (ref 8.9–10.3)
Chloride: 96 mmol/L — ABNORMAL LOW (ref 98–111)
Creatinine, Ser: 1.09 mg/dL (ref 0.61–1.24)
GFR, Estimated: >60 mL/min
Glucose, Bld: 296 mg/dL — ABNORMAL HIGH (ref 70–99)
Potassium: 4.6 mmol/L (ref 3.5–5.1)
Sodium: 133 mmol/L — ABNORMAL LOW (ref 135–145)
Total Bilirubin: 0.4 mg/dL (ref 0.0–1.2)
Total Protein: 7.5 g/dL (ref 6.5–8.1)

## 2024-03-20 LAB — GLUCOSE, CAPILLARY
Glucose-Capillary: 273 mg/dL — ABNORMAL HIGH (ref 70–99)
Glucose-Capillary: 162 mg/dL — ABNORMAL HIGH (ref 70–99)
Glucose-Capillary: 133 mg/dL — ABNORMAL HIGH (ref 70–99)
Glucose-Capillary: 240 mg/dL — ABNORMAL HIGH (ref 70–99)

## 2024-03-20 LAB — MAGNESIUM: Magnesium: 1.9 mg/dL (ref 1.7–2.4)

## 2024-03-20 LAB — PHOSPHORUS: Phosphorus: 3.6 mg/dL (ref 2.5–4.6)

## 2024-03-20 MED ORDER — TRAZODONE HCL 50 MG PO TABS
50.0000 mg | ORAL_TABLET | Freq: Every evening | ORAL | Status: DC | PRN
  Administered 2024-03-20 – 2024-03-25 (×5): 50 mg via ORAL
  Filled 2024-03-20 (×5): qty 1

## 2024-03-20 NOTE — Plan of Care (Signed)
  Problem: Coping: Goal: Ability to adjust to condition or change in health will improve Outcome: Progressing   Problem: Health Behavior/Discharge Planning: Goal: Ability to identify and utilize available resources and services will improve Outcome: Progressing Goal: Ability to manage health-related needs will improve Outcome: Progressing   

## 2024-03-20 NOTE — Plan of Care (Signed)
  Problem: Pain Managment: Goal: General experience of comfort will improve and/or be controlled Outcome: Progressing   Problem: Safety: Goal: Ability to remain free from injury will improve Outcome: Progressing

## 2024-03-20 NOTE — Progress Notes (Addendum)
 " PROGRESS NOTE    David Wyatt  FMW:[id number] DOB: 1960-10-10 DOA: 03/13/2024 PCP: Delbert Clam, MD   Brief Narrative:  The patient is a 64 year old with history of uncontrolled DM2, HTN, HLD, cocaine  use, diabetic neuropathy, chronic back pain, arthritis comes to the ED after a fall leading to left hip pain.  X-ray showed left-sided hip fracture, orthopedic consulted.  Left hip closed reduction/IM fixation of left hip 1/4.  Postop management including wound care, pain control, weightbearing, follow-up per their service.  Foley catheter placed due to urinary retention.  Currently awaiting SNF placement but has no safe discharge disposition as he has lack of bed offers.   Assessment and Plan:  Left femoral intertrochanteric fracture: - Left hip closed reduction/IM fixation of left hip 1/4.  Postop management including wound care, pain control, weightbearing, follow-up per their service.  Continues to have significant pain and will continue acetaminophen  1000 mg p.o. 3 times daily as well as 650 mg p.o. every 6 as needed for mild pain and oxycodone  5 to 10 mg p.o. every 4 as needed for moderate pain.  VTE prophylaxis with aspirin  81 mg p.o. twice daily per the orthopedic surgeon.  Continue bowel regimen with docusate 100 mg p.o. twice daily, MiraLAX  17 g p.o. daily and senna docusate 1 tab p.o. nightly as needed for moderate constipation.  Will add the carbamoyl 500 mg p.o. every 6 as needed for muscle spasms.  PT/OT recommending SNF but has no safe discharge disposition at this time   Leukocytosis / Thrombocytosis: Likely reactive.  He is afebrile.  WBC and Plt Count Trend:  Recent Labs  Lab 03/07/24 0532 03/08/24 0150 03/13/24 2207 03/14/24 1124 03/15/24 0301 03/19/24 0944 03/20/24 0617  WBC 7.7 5.6 14.1* 13.5* 11.8* 11.8* 10.1  PLT 290 314 487* 485* 449* 685* 648*  -CTM and Trend and repeat in the AM  Urinary Retention: Foley catheter placed, voiding trial 1 week from placement >  03/20/24 and will attempt in the AM.  Initiate tamsulosin    Uncontrolled Insulin -Dependent type 2 diabetes Diabetic peripheral neuropathy - Recent A1c of 11.8 continue with Semglee  30 units subcu daily, moderate NovoLog  sign scale insulin  AC and at bedtime and metformin  1000 mg p.o. twice daily. CTM CBG's per protcol.  Recent Labs  Lab 03/19/24 0621 03/19/24 1115 03/19/24 1723 03/19/24 2216 03/20/24 0616 03/20/24 1152 03/20/24 1631  GLUCAP 130* 300* 252* 108* 273* 162* 133*  - Continue with gabapentin  600 mg p.o. 3 times daily continue with glucagon  1 mg IV as needed for low blood sugar  Hyponatremia: Mild and Fluctuating. Na+ Trend:  Recent Labs  Lab 03/07/24 0314 03/08/24 0150 03/13/24 2207 03/14/24 1124 03/15/24 0301 03/19/24 0944 03/20/24 0617  NA 137 134* 135 139 134* 134* 133*  -Repeat CMP in the AM   Essential Hypertension: Continue with Imdur  30 mg p.o. daily.  Continue with IV hydralazine  10 mg Q6 as needed for high blood pressure.  To monitor blood pressure per protocol.  Last blood pressure reading was 130/79   Depression: On Duloxetine  60 mg p.o. every evening   History of cocaine  use:  Recommend Cessation    Hyperlipidemia: Continue Atorvastatin  40 mg p.o. Daily   Tobacco Abuse: Smoking cessation counseling given. Continue with Nicotine  Patch 21 mg TD  Hypoalbuminemia: Patient's Albumin Trend: Recent Labs  Lab 03/04/24 1104 03/07/24 0230 03/19/24 0944 03/20/24 0617  ALBUMIN 3.9 3.9 3.5 3.4*  -Continue to Monitor and Trend and repeat CMP in the AM  DVT prophylaxis: SCDs Start: 03/13/24 2315    Code Status: Full Code Family Communication: No family present at bedside  Disposition Plan:  Level of care: Med-Surg Status is: Inpatient Remains inpatient appropriate because: Needs SNF and awaiting placement    Consultants:  Orthopedic Surgery  Procedures:  As delineated as above  Antimicrobials:  Anti-infectives (From admission, onward)     None       Subjective: Seen and examined at bedside and was still having some pain in his left leg and complaining little bit of nausea.  No lightheadedness or dizziness.  No other concerns or complaints this time.  Objective: Vitals:   03/19/24 2023 03/20/24 0508 03/20/24 0811 03/20/24 1400  BP: 132/85 (!) 135/92 (!) 178/93 130/79  Pulse: (!) 101 (!) 102 97   Resp: 18 17 15 17   Temp: 98.7 F (37.1 C) 98.1 F (36.7 C) (!) 97.5 F (36.4 C) 98.8 F (37.1 C)  TempSrc: Oral Oral Oral Oral  SpO2: 93% 95% 100% 90%  Weight:      Height:        Intake/Output Summary (Last 24 hours) at 03/20/2024 1830 Last data filed at 03/20/2024 1500 Gross per 24 hour  Intake 360 ml  Output 625 ml  Net -265 ml   Filed Weights   03/15/24 1500  Weight: 68 kg   Examination: Physical Exam:  Constitutional: Chronically ill-appearing African-American male who appears uncomfortable complaining of some pain Respiratory: Diminished to auscultation bilaterally, no wheezing, rales, rhonchi or crackles. Normal respiratory effort and patient is not tachypenic. No accessory muscle use.  Unlabored breathing Cardiovascular: RRR, no murmurs / rubs / gallops. S1 and S2 auscultated. No extremity edema.  Abdomen: Soft, non-tender, non-distended. Bowel sounds positive.  GU: Deferred. Musculoskeletal: No clubbing / cyanosis of digits/nails. No joint deformity upper and lower extremities.  Skin: No rashes, lesions, ulcers on limited skin evaluation. No induration; Warm and dry.  Neurologic: CN 2-12 grossly intact with no focal deficits. Romberg sign and cerebellar reflexes not assessed.  Psychiatric: A little anxious.   Data Reviewed: I have personally reviewed following labs and imaging studies  CBC: Recent Labs  Lab 03/13/24 2207 03/14/24 1124 03/15/24 0301 03/19/24 0944 03/20/24 0617  WBC 14.1* 13.5* 11.8* 11.8* 10.1  NEUTROABS 11.6*  --   --  8.4* 7.0  HGB 12.7* 12.9* 11.7* 12.5* 11.6*  HCT 39.1 39.5  36.1* 37.9* 35.1*  MCV 95.1 93.4 95.0 92.7 92.9  PLT 487* 485* 449* 685* 648*   Basic Metabolic Panel: Recent Labs  Lab 03/13/24 2207 03/14/24 1124 03/15/24 0301 03/19/24 0944 03/20/24 0617  NA 135 139 134* 134* 133*  K 4.5 4.7 4.1 4.9 4.6  CL 101 101 99 96* 96*  CO2 22 26 25 26 27   GLUCOSE 188* 235* 286* 232* 296*  BUN 16 14 16 20 22   CREATININE 1.09 1.10 1.09 1.02 1.09  CALCIUM  9.3 9.1 8.6* 10.1 9.8  MG  --   --  1.6* 1.8 1.9  PHOS  --   --  2.5 3.7 3.6   GFR: Estimated Creatinine Clearance: 65.9 mL/min (by C-G formula based on SCr of 1.09 mg/dL). Liver Function Tests: Recent Labs  Lab 03/19/24 0944 03/20/24 0617  AST 43* 31  ALT 28 25  ALKPHOS 96 92  BILITOT 0.7 0.4  PROT 7.9 7.5  ALBUMIN 3.5 3.4*   No results for input(s): LIPASE, AMYLASE in the last 168 hours. No results for input(s): AMMONIA in the last 168 hours. Coagulation  Profile: No results for input(s): INR, PROTIME in the last 168 hours. Cardiac Enzymes: No results for input(s): CKTOTAL, CKMB, CKMBINDEX, TROPONINI in the last 168 hours. BNP (last 3 results) No results for input(s): PROBNP in the last 8760 hours. HbA1C: No results for input(s): HGBA1C in the last 72 hours. CBG: Recent Labs  Lab 03/19/24 1723 03/19/24 2216 03/20/24 0616 03/20/24 1152 03/20/24 1631  GLUCAP 252* 108* 273* 162* 133*   Lipid Profile: No results for input(s): CHOL, HDL, LDLCALC, TRIG, CHOLHDL, LDLDIRECT in the last 72 hours. Thyroid  Function Tests: No results for input(s): TSH, T4TOTAL, FREET4, T3FREE, THYROIDAB in the last 72 hours. Anemia Panel: No results for input(s): VITAMINB12, FOLATE, FERRITIN, TIBC, IRON, RETICCTPCT in the last 72 hours. Sepsis Labs: No results for input(s): PROCALCITON, LATICACIDVEN in the last 168 hours.  No results found for this or any previous visit (from the past 240 hours).   Radiology Studies: No results  found.  Scheduled Meds:  acetaminophen   1,000 mg Oral TID   amLODipine   10 mg Oral Daily   aspirin  EC  81 mg Oral BID   atorvastatin   40 mg Oral Daily   bisacodyl   10 mg Oral Q1500   Chlorhexidine  Gluconate Cloth  6 each Topical Daily   docusate sodium   100 mg Oral BID   DULoxetine   60 mg Oral QPM   folic acid   1 mg Oral Daily   gabapentin   600 mg Oral TID   insulin  aspart  0-15 Units Subcutaneous TID WC   insulin  aspart  0-5 Units Subcutaneous QHS   insulin  aspart  5 Units Subcutaneous TID WC   insulin  glargine-yfgn  30 Units Subcutaneous Daily   isosorbide  mononitrate  30 mg Oral Daily   lactulose   20 g Oral BID   metFORMIN   1,000 mg Oral BID WC   nicotine   21 mg Transdermal Daily   polyethylene glycol  17 g Oral Daily   tamsulosin   0.4 mg Oral Daily   Continuous Infusions:   LOS: 7 days   Alejandro Marker, DO [facility] Available via Epic secure chat 7am-7pm After these hours, please refer to coverage provider listed on [email or web address] 03/20/2024, 6:30 PM  "

## 2024-03-21 DIAGNOSIS — S72142A Displaced intertrochanteric fracture of left femur, initial encounter for closed fracture: Secondary | ICD-10-CM | POA: Diagnosis not present

## 2024-03-21 LAB — GLUCOSE, CAPILLARY
Glucose-Capillary: 184 mg/dL — ABNORMAL HIGH (ref 70–99)
Glucose-Capillary: 233 mg/dL — ABNORMAL HIGH (ref 70–99)
Glucose-Capillary: 139 mg/dL — ABNORMAL HIGH (ref 70–99)
Glucose-Capillary: 252 mg/dL — ABNORMAL HIGH (ref 70–99)

## 2024-03-21 NOTE — Progress Notes (Signed)
 " PROGRESS NOTE    David Wyatt  FMW:[id number] DOB: [birth year] DOA: 03/13/2024 PCP: Delbert Clam, MD   Brief Narrative:  The patient is a 64 year old with history of uncontrolled DM2, HTN, HLD, cocaine  use, diabetic neuropathy, chronic back pain, arthritis comes to the ED after a fall leading to left hip pain.  X-ray showed left-sided hip fracture, orthopedic consulted.  Left hip closed reduction/IM fixation of left hip 1/4.  Postop management including wound care, pain control, weightbearing, follow-up per their service.  Foley catheter placed due to urinary retention.  Currently awaiting SNF placement but has no safe discharge disposition as he has lack of bed offers.   Assessment and Plan:  Left femoral intertrochanteric fracture: - Left hip closed reduction/IM fixation of left hip 1/4.  Postop management including wound care, pain control, weightbearing, follow-up per their service.  Continues to have significant pain and will continue acetaminophen  1000 mg p.o. 3 times daily as well as 650 mg p.o. every 6 as needed for mild pain and oxycodone  5 to 10 mg p.o. every 4 as needed for moderate pain.  VTE prophylaxis with aspirin  81 mg p.o. twice daily per the orthopedic surgeon.  Continue bowel regimen with docusate 100 mg p.o. twice daily, MiraLAX  17 g p.o. daily and senna docusate 1 tab p.o. nightly as needed for moderate constipation.  Will add the carbamoyl 500 mg p.o. every 6 as needed for muscle spasms.  PT/OT recommending SNF but has no safe discharge disposition at this time   Leukocytosis / Thrombocytosis: Likely reactive.  He is afebrile.  WBC and Plt Count Trend:  Recent Labs  Lab 03/07/24 0532 03/08/24 0150 03/13/24 2207 03/14/24 1124 03/15/24 0301 03/19/24 0944 03/20/24 0617  WBC 7.7 5.6 14.1* 13.5* 11.8* 11.8* 10.1  PLT 290 314 487* 485* 449* 685* 648*  -CTM and Trend and repeat in the AM  Urinary Retention: Foley catheter placed but now removed and Voiding Trial Done.  Check Bladder Scan q8h   Uncontrolled Insulin -Dependent type 2 diabetes Diabetic peripheral neuropathy - Recent A1c of 11.8 continue with Semglee  30 units subcu daily, moderate NovoLog  sign scale insulin  AC and at bedtime and metformin  1000 mg p.o. twice daily. CTM CBG's per protcol.  Recent Labs  Lab 03/20/24 1152 03/20/24 1631 03/20/24 2214 03/21/24 0531 03/21/24 0904 03/21/24 1152 03/21/24 1535  GLUCAP 162* 133* 240* 184* 233* 139* 252*  - Continue with Gabapentin  600 mg p.o. 3 times daily continue with glucagon  1 mg IV as needed for low blood sugar  Hyponatremia: Mild and Fluctuating. Na+ Trend:  Recent Labs  Lab 03/07/24 0314 03/08/24 0150 03/13/24 2207 03/14/24 1124 03/15/24 0301 03/19/24 0944 03/20/24 0617  NA 137 134* 135 139 134* 134* 133*  -Repeat CMP in the AM   Essential Hypertension: Continue with Imdur  30 mg p.o. daily.  Continue with IV hydralazine  10 mg Q6 as needed for high blood pressure.  To monitor blood pressure per protocol.  Last blood pressure reading was 124/75   Depression: On Duloxetine  60 mg p.o. every evening   History of cocaine  use:  Recommend Cessation    Hyperlipidemia: Continue Atorvastatin  40 mg p.o. Daily   Tobacco Abuse: Smoking cessation counseling given. Continue with Nicotine  Patch 21 mg TD  Hypoalbuminemia: Patient's Albumin Trend: Recent Labs  Lab 03/04/24 1104 03/07/24 0230 03/19/24 0944 03/20/24 0617  ALBUMIN 3.9 3.9 3.5 3.4*  -Continue to Monitor and Trend and repeat CMP intermittently   DVT prophylaxis: SCDs Start: 03/13/24 2315  Code Status: Full Code Family Communication: No family present @ bedside  Disposition Plan:  Level of care: Med-Surg Status is: Inpatient Remains inpatient appropriate because:    Consultants:  Orthopedic Surgery   Procedures:  As delineated as above  Antimicrobials:  Anti-infectives (From admission, onward)    None       Subjective: Seen and examined at bedside and is  resting.  Still complaining of some difficulty with his mobility and pain in his leg.  No nausea or vomiting.  Wanting to sleep but he was wanting some pain medication.  Objective: Vitals:   03/21/24 0448 03/21/24 0903 03/21/24 1223 03/21/24 1506  BP: 114/70 116/79 (!) 143/83 124/75  Pulse: 98 86 89 98  Resp:  16  16  Temp: 98.8 F (37.1 C) 97.6 F (36.4 C)  98 F (36.7 C)  TempSrc: Oral   Oral  SpO2: 90% (!) 88% 94% 90%  Weight:      Height:        Intake/Output Summary (Last 24 hours) at 03/21/2024 1915 Last data filed at 03/21/2024 1857 Gross per 24 hour  Intake 840 ml  Output 1075 ml  Net -235 ml   Filed Weights   03/15/24 1500  Weight: 68 kg   Examination: Physical Exam:  Constitutional: Chronically ill-appearing African-American male who appears calm and resting Respiratory: Diminished to auscultation bilaterally, no wheezing, rales, rhonchi or crackles. Normal respiratory effort and patient is not tachypenic. No accessory muscle use.  Unlabored breathing Cardiovascular: RRR, no murmurs / rubs / gallops. S1 and S2 auscultated. No extremity edema.  Abdomen: Soft, non-tender, non-distended. Bowel sounds positive.  GU: Deferred. Musculoskeletal: No clubbing / cyanosis of digits/nails. No joint deformity upper and lower extremities. Good ROM, no contractures. Normal strength and muscle tone.  Skin: No rashes, lesions, ulcers on limited skin evaluation. No induration; Warm and dry.  Neurologic: CN 2-12 grossly intact with no focal deficits. Romberg sign and cerebellar reflexes not assessed.  Psychiatric: Normal judgment and insight. Alert and oriented x 3. Appears calm  Data Reviewed: I have personally reviewed following labs and imaging studies  CBC: Recent Labs  Lab 03/15/24 0301 03/19/24 0944 03/20/24 0617  WBC 11.8* 11.8* 10.1  NEUTROABS  --  8.4* 7.0  HGB 11.7* 12.5* 11.6*  HCT 36.1* 37.9* 35.1*  MCV 95.0 92.7 92.9  PLT 449* 685* 648*   Basic Metabolic  Panel: Recent Labs  Lab 03/15/24 0301 03/19/24 0944 03/20/24 0617  NA 134* 134* 133*  K 4.1 4.9 4.6  CL 99 96* 96*  CO2 25 26 27   GLUCOSE 286* 232* 296*  BUN 16 20 22   CREATININE 1.09 1.02 1.09  CALCIUM  8.6* 10.1 9.8  MG 1.6* 1.8 1.9  PHOS 2.5 3.7 3.6   GFR: Estimated Creatinine Clearance: 65.9 mL/min (by C-G formula based on SCr of 1.09 mg/dL). Liver Function Tests: Recent Labs  Lab 03/19/24 0944 03/20/24 0617  AST 43* 31  ALT 28 25  ALKPHOS 96 92  BILITOT 0.7 0.4  PROT 7.9 7.5  ALBUMIN 3.5 3.4*   No results for input(s): LIPASE, AMYLASE in the last 168 hours. No results for input(s): AMMONIA in the last 168 hours. Coagulation Profile: No results for input(s): INR, PROTIME in the last 168 hours. Cardiac Enzymes: No results for input(s): CKTOTAL, CKMB, CKMBINDEX, TROPONINI in the last 168 hours. BNP (last 3 results) No results for input(s): PROBNP in the last 8760 hours. HbA1C: No results for input(s): HGBA1C in the last 72  hours. CBG: Recent Labs  Lab 03/20/24 2214 03/21/24 0531 03/21/24 0904 03/21/24 1152 03/21/24 1535  GLUCAP 240* 184* 233* 139* 252*   Lipid Profile: No results for input(s): CHOL, HDL, LDLCALC, TRIG, CHOLHDL, LDLDIRECT in the last 72 hours. Thyroid  Function Tests: No results for input(s): TSH, T4TOTAL, FREET4, T3FREE, THYROIDAB in the last 72 hours. Anemia Panel: No results for input(s): VITAMINB12, FOLATE, FERRITIN, TIBC, IRON, RETICCTPCT in the last 72 hours. Sepsis Labs: No results for input(s): PROCALCITON, LATICACIDVEN in the last 168 hours.  No results found for this or any previous visit (from the past 240 hours).   Radiology Studies: No results found.  Scheduled Meds:  acetaminophen   1,000 mg Oral TID   amLODipine   10 mg Oral Daily   aspirin  EC  81 mg Oral BID   atorvastatin   40 mg Oral Daily   bisacodyl   10 mg Oral Q1500   Chlorhexidine  Gluconate Cloth  6  each Topical Daily   docusate sodium   100 mg Oral BID   DULoxetine   60 mg Oral QPM   folic acid   1 mg Oral Daily   gabapentin   600 mg Oral TID   insulin  aspart  0-15 Units Subcutaneous TID WC   insulin  aspart  0-5 Units Subcutaneous QHS   insulin  aspart  5 Units Subcutaneous TID WC   insulin  glargine-yfgn  30 Units Subcutaneous Daily   isosorbide  mononitrate  30 mg Oral Daily   lactulose   20 g Oral BID   metFORMIN   1,000 mg Oral BID WC   nicotine   21 mg Transdermal Daily   polyethylene glycol  17 g Oral Daily   tamsulosin   0.4 mg Oral Daily   Continuous Infusions:   LOS: 8 days   Alejandro Marker, DO [facility] Available via Epic secure chat 7am-7pm After these hours, please refer to coverage provider listed on [email or web address] 03/21/2024, 7:15 PM  "

## 2024-03-21 NOTE — Plan of Care (Signed)

## 2024-03-22 DIAGNOSIS — S72142A Displaced intertrochanteric fracture of left femur, initial encounter for closed fracture: Secondary | ICD-10-CM | POA: Diagnosis not present

## 2024-03-22 LAB — GLUCOSE, CAPILLARY
Glucose-Capillary: 124 mg/dL — ABNORMAL HIGH (ref 70–99)
Glucose-Capillary: 83 mg/dL (ref 70–99)
Glucose-Capillary: 145 mg/dL — ABNORMAL HIGH (ref 70–99)
Glucose-Capillary: 144 mg/dL — ABNORMAL HIGH (ref 70–99)
Glucose-Capillary: 103 mg/dL — ABNORMAL HIGH (ref 70–99)

## 2024-03-22 LAB — COMPREHENSIVE METABOLIC PANEL WITH GFR
ALT: 25 U/L (ref 0–44)
AST: 30 U/L (ref 15–41)
Albumin: 3.2 g/dL — ABNORMAL LOW (ref 3.5–5.0)
Alkaline Phosphatase: 97 U/L (ref 38–126)
Anion gap: 11 (ref 5–15)
BUN: 22 mg/dL (ref 8–23)
CO2: 26 mmol/L (ref 22–32)
Calcium: 9.5 mg/dL (ref 8.9–10.3)
Chloride: 98 mmol/L (ref 98–111)
Creatinine, Ser: 1.03 mg/dL (ref 0.61–1.24)
GFR, Estimated: >60 mL/min
Glucose, Bld: 123 mg/dL — ABNORMAL HIGH (ref 70–99)
Potassium: 4.5 mmol/L (ref 3.5–5.1)
Sodium: 135 mmol/L (ref 135–145)
Total Bilirubin: 0.5 mg/dL (ref 0.0–1.2)
Total Protein: 7.3 g/dL (ref 6.5–8.1)

## 2024-03-22 LAB — CBC WITH DIFFERENTIAL/PLATELET
Abs Immature Granulocytes: 0.09 K/uL — ABNORMAL HIGH (ref 0.00–0.07)
Basophils Absolute: 0.1 K/uL (ref 0.0–0.1)
Basophils Relative: 1 %
Eosinophils Absolute: 0.4 K/uL (ref 0.0–0.5)
Eosinophils Relative: 4 %
HCT: 33.3 % — ABNORMAL LOW (ref 39.0–52.0)
Hemoglobin: 10.7 g/dL — ABNORMAL LOW (ref 13.0–17.0)
Immature Granulocytes: 1 %
Lymphocytes Relative: 20 %
Lymphs Abs: 2.2 K/uL (ref 0.7–4.0)
MCH: 30.4 pg (ref 26.0–34.0)
MCHC: 32.1 g/dL (ref 30.0–36.0)
MCV: 94.6 fL (ref 80.0–100.0)
Monocytes Absolute: 0.9 K/uL (ref 0.1–1.0)
Monocytes Relative: 8 %
Neutro Abs: 7.2 K/uL (ref 1.7–7.7)
Neutrophils Relative %: 66 %
Platelets: 667 K/uL — ABNORMAL HIGH (ref 150–400)
RBC: 3.52 MIL/uL — ABNORMAL LOW (ref 4.22–5.81)
RDW: 13.6 % (ref 11.5–15.5)
WBC: 10.9 K/uL — ABNORMAL HIGH (ref 4.0–10.5)
nRBC: 0 % (ref 0.0–0.2)

## 2024-03-22 LAB — MAGNESIUM: Magnesium: 1.7 mg/dL (ref 1.7–2.4)

## 2024-03-22 LAB — PHOSPHORUS: Phosphorus: 2.9 mg/dL (ref 2.5–4.6)

## 2024-03-22 MED ORDER — MAGNESIUM SULFATE 2 GM/50ML IV SOLN
2.0000 g | Freq: Once | INTRAVENOUS | Status: AC
  Administered 2024-03-22: 2 g via INTRAVENOUS
  Filled 2024-03-22: qty 50

## 2024-03-22 NOTE — Progress Notes (Signed)
 Physical Therapy Treatment Patient Details Name: David Wyatt MRN: [id number] DOB: 1961-01-10 Today's Date: 03/22/2024   History of Present Illness 64 y.o. male presents 03/13/24 with L hip pain. Imaging shows displaced intertrochanteric fracture on the left. S/p IM rod L hip 1/4. PMH: DM2 with neuropathy, HTN, HLD, substance use, chronic back pain, arthritis.    PT Comments  Pt received in supine, agreeable to therapy session with encouragement, pt limited due to pain. Pt c/o fatigue after step pivot from bed to chair, pt able to don paper scrub outfit with assist prior to EOB/OOB, as he was not clothed. Pt encouraged to get OOB to chair daily with staff assist, he reports why did I get up?, PTA reinforcing benefits of mobility. Pt given supine/seated LE HEP handout after session, plan to review next session once pt pain more controlled.  Patient will benefit from continued inpatient follow up therapy, <3 hours/day, pt remains below prior level of function and currently requiring up to +2 modA and dense safety cues for transfer to EOB and step pivot to chair with RW, not able to ambulate due to pain.  Orthostatic Lying   BP- Lying 123/85  Pulse- Lying 100  Orthostatic Sitting  BP- Sitting 145/84  Pulse- Sitting 108     If plan is discharge home, recommend the following: Two people to help with walking and/or transfers;Two people to help with bathing/dressing/bathroom;Assist for transportation;Assistance with cooking/housework;Help with stairs or ramp for entrance   Can travel by private vehicle     No  Equipment Recommendations  Rolling walker (2 wheels);BSC/3in1 (at current level of function, would need wheelchair, not yet ambulating household distances)    Recommendations for Other Services       Precautions / Restrictions Precautions Precautions: Fall Recall of Precautions/Restrictions: Impaired Precaution/Restrictions Comments: aware of WB precs but needs reinforcement of safety  due to distraction with pain. Restrictions Weight Bearing Restrictions Per Provider Order: Yes LLE Weight Bearing Per Provider Order: Weight bearing as tolerated     Mobility  Bed Mobility Overal bed mobility: Needs Assistance Bed Mobility: Supine to Sit     Supine to sit: Min assist, +2 for physical assistance     General bed mobility comments: improved abilities with use of gait belt as leg lifter for LLE to EOB. Step by step sequencing cues, pt encouraged to use bed rail to assist with trunk control but he defers, requesting HHA for RUE as he is using LUE to move LLE with gait belt. Also needs minA for LLE to lower when advancing leg and scooting due to c/o pain.    Transfers Overall transfer level: Needs assistance Equipment used: Rolling walker (2 wheels) Transfers: Sit to/from Stand, Bed to chair/wheelchair/BSC Sit to Stand: Mod assist, +2 physical assistance, +2 safety/equipment   Step pivot transfers: Mod assist, +2 physical assistance       General transfer comment: EOB>RW and RW>chair on his L side. Small shuffled steps, pt mostly staying within bounds of RW, but at times with exaggerated trunk and bil knee flexion, needing mod cues for forward gaze/trunk up to avoid imbalance and maximize RW support for less pain. Pt very fatigued once in chair, pt defers ambulation in room due to c/o severe LLE pain and fatigue. RN notified pt asking for blood sugar to be checked.    Ambulation/Gait             Pre-gait activities: shuffled pivotal and backward steps toward chair on his L side wtih RW  and min to modA (+2), near-buckling at times; <64ft total.     Stairs             Wheelchair Mobility     Tilt Bed    Modified Rankin (Stroke Patients Only)       Balance Overall balance assessment: Needs assistance Sitting-balance support: Bilateral upper extremity supported, Feet supported Sitting balance-Leahy Scale: Fair Sitting balance - Comments: pt  preferring BUE support due to pain/guarding   Standing balance support: Bilateral upper extremity supported, During functional activity, Reliant on assistive device for balance Standing balance-Leahy Scale: Poor Standing balance comment: heavy reliance on BUE support on RW, also needs external assist at times.                            Communication Communication Communication: No apparent difficulties  Cognition Arousal: Alert, Lethargic Behavior During Therapy: WFL for tasks assessed/performed, Anxious   PT - Cognitive impairments: No family/caregiver present to determine baseline, Attention, Initiation, Sequencing, Problem solving, Safety/Judgement                       PT - Cognition Comments: Motivated to participate, but internally distracted due to pain. PTA had discussed plan for premedication with RN prior to pt getting up but appears she may not have been able as planned. RN arriving at end of session to give meds. Pt more lethargic toward end of session and requesting to have his blood sugar checked, PTA brought apple juice per his request with clearance from RN. Following commands: Impaired Following commands impaired: Only follows one step commands consistently, Follows one step commands with increased time    Cueing Cueing Techniques: Verbal cues, Visual cues, Gestural cues, Tactile cues  Exercises Other Exercises Other Exercises: IS x 5 reps, pt achieves >1,000 mL, he is encouraged to perform 10x hourly, hip HEP handout brought to room after session, plan to review supine/seated HEP next session if pain less severe.    General Comments General comments (skin integrity, edema, etc.): BP stable, but slightly elevated wtih exertion due to pain; HR slightly elevated as pain increased; RN and secretary notified pt feels that his blood sugar is low and asking to have it checked. RN agreeable to PTA bringing apple juice to room for him to drink per his request.  Ice pack provided to place over his L hip after encouragement.      Pertinent Vitals/Pain Pain Assessment Pain Assessment: 0-10 Pain Score: 8  Pain Location: L hip with movement Pain Descriptors / Indicators: Grimacing, Guarding, Sore Pain Intervention(s): Limited activity within patient's tolerance, Monitored during session, Repositioned, Patient requesting pain meds-RN notified, Ice applied    Home Living                          Prior Function            PT Goals (current goals can now be found in the care plan section) Acute Rehab PT Goals Patient Stated Goal: Less pain and more therapy so I can move better and get out of hospital. Progress towards PT goals: Progressing toward goals (slowly)    Frequency    Min 2X/week      PT Plan      Co-evaluation              AM-PAC PT 6 Clicks Mobility   Outcome Measure  Help needed  turning from your back to your side while in a flat bed without using bedrails?: A Lot Help needed moving from lying on your back to sitting on the side of a flat bed without using bedrails?: A Lot Help needed moving to and from a bed to a chair (including a wheelchair)?: A Lot Help needed standing up from a chair using your arms (e.g., wheelchair or bedside chair)?: A Lot Help needed to walk in hospital room?: Total Help needed climbing 3-5 steps with a railing? : Total 6 Click Score: 10    End of Session Equipment Utilized During Treatment: Gait belt Activity Tolerance: Patient limited by pain;Patient limited by fatigue Patient left: in chair;with call bell/phone within reach;with chair alarm set;Other (comment) (reclined in chair, RN bringing PO pain meds in medicine cup at end of session) Nurse Communication: Mobility status;Need for lift equipment;Patient requests pain meds;Precautions;Other (comment) (recommend Stedy and +2 for pt safety with return to supine from chair; pt asking for premedication before session; also  feels his blood sugar may be low, RN and diplomatic services operational officer notified he wants blood sugar checked.) PT Visit Diagnosis: Unsteadiness on feet (R26.81);Pain;Other abnormalities of gait and mobility (R26.89);Muscle weakness (generalized) (M62.81) Pain - Right/Left: Left Pain - part of body: Hip;Leg     Time: 8593-8566 PT Time Calculation (min) (ACUTE ONLY): 27 min  Charges:    $Therapeutic Activity: 23-37 mins PT General Charges $$ ACUTE PT VISIT: 1 Visit                     Allee Busk P., PTA Acute Rehabilitation Services Secure Chat Preferred 9a-5:30pm Office: [phone]    Connell HERO [employer] 03/22/2024, 2:50 PM

## 2024-03-22 NOTE — Progress Notes (Signed)
 RN consulted MD/Social Investment Banker, Operational of pts request for assistance with re-scheduling court date that's set for 03/25/24 @ [place]  RN messaged health care team via secure chat: Pt has a court date on 1/15. @[place]  or @Angela  is that something you can assist with (faxing doctors note informing them of pts current situation: Hospitalization since 1/4, Hip Surgery, and SNF placement and the need to reschedule).

## 2024-03-22 NOTE — Progress Notes (Signed)
 " PROGRESS NOTE    David Wyatt  FMW:[id number] DOB: Jan 11, 1961 DOA: 03/13/2024 PCP: Delbert Clam, MD   Brief Narrative:  The patient is a 64 year old with history of uncontrolled DM2, HTN, HLD, cocaine  use, diabetic neuropathy, chronic back pain, arthritis comes to the ED after a fall leading to left hip pain.  X-ray showed left-sided hip fracture, orthopedic consulted.  Left hip closed reduction/IM fixation of left hip 1/4.  Postop management including wound care, pain control, weightbearing, follow-up per their service.  Foley catheter placed due to urinary retention but removed given improvement.  Currently awaiting SNF placement but has no safe discharge disposition as he has lack of bed offers.   Assessment and Plan:  Left femoral intertrochanteric fracture: - Left hip closed reduction/IM fixation of left hip 1/4.  Postop management including wound care, pain control, weightbearing, follow-up per their service.  Continues to have significant pain and will continue acetaminophen  1000 mg p.o. 3 times daily as well as 650 mg p.o. every 6 as needed for mild pain and oxycodone  5 to 10 mg p.o. every 4 as needed for moderate pain.  VTE prophylaxis with aspirin  81 mg p.o. twice daily per the orthopedic surgeon.  Continue bowel regimen with docusate 100 mg p.o. twice daily, MiraLAX  17 g p.o. daily and senna docusate 1 tab p.o. nightly as needed for moderate constipation.  Will add the carbamoyl 500 mg p.o. every 6 as needed for muscle spasms.  PT/OT recommending SNF but has no safe discharge disposition at this time   Leukocytosis / Thrombocytosis: Likely reactive.  He is afebrile.  WBC and Plt Count Trend fluctuating:  Recent Labs  Lab 03/08/24 0150 03/13/24 2207 03/14/24 1124 03/15/24 0301 03/19/24 0944 03/20/24 0617 03/22/24 0653  WBC 5.6 14.1* 13.5* 11.8* 11.8* 10.1 10.9*  PLT 314 487* 485* 449* 685* 648* 667*  -CTM and Trend and repeat in the AM  Urinary Retention: Foley catheter was in  place but now removed and Voiding Trial Done. Check Bladder Scan q8h; No evidence of retention   Uncontrolled Insulin -Dependent type 2 diabetes Diabetic Peripheral Neuropathy - Recent A1c of 11.8 continue with Semglee  30 units subcu daily, moderate NovoLog  sign scale insulin  AC and at bedtime and metformin  1000 mg p.o. twice daily. CTM CBG's per protcol.  Recent Labs  Lab 03/21/24 0904 03/21/24 1152 03/21/24 1535 03/22/24 0629 03/22/24 0740 03/22/24 1113 03/22/24 1505  GLUCAP 233* 139* 252* 124* 83 145* 144*  - Continue with Gabapentin  600 mg p.o. 3 times daily continue with glucagon  1 mg IV as needed for low blood sugar  Hyponatremia: Mild and Fluctuating. Na+ Trend:  Recent Labs  Lab 03/08/24 0150 03/13/24 2207 03/14/24 1124 03/15/24 0301 03/19/24 0944 03/20/24 0617 03/22/24 0653  NA 134* 135 139 134* 134* 133* 135  -Repeat CMP in the AM   Essential Hypertension: Continue with Imdur  30 mg p.o. daily.  Continue with IV hydralazine  10 mg Q6 as needed for high blood pressure. CTM BP per protocol.  Last blood pressure reading was 150/90 on last check   Depression: On Duloxetine  60 mg p.o. every evening  Normocytic Anemia: Hgb/Hct Trend:  Recent Labs  Lab 03/08/24 0150 03/13/24 2207 03/14/24 1124 03/15/24 0301 03/19/24 0944 03/20/24 0617 03/22/24 0653  HGB 11.7* 12.7* 12.9* 11.7* 12.5* 11.6* 10.7*  HCT 35.6* 39.1 39.5 36.1* 37.9* 35.1* 33.3*  MCV 94.2 95.1 93.4 95.0 92.7 92.9 94.6  -Relatively Stable but dropped slightly. Check Anemia Panel in the AM. CTM S/Sx of Bleeding;  No overt bleeding noted. Repeat CBC in the AM   History of cocaine  use:  Recommend Cessation    Hyperlipidemia: Continue Atorvastatin  40 mg p.o. Daily   Tobacco Abuse: Smoking cessation counseling given. Continue with Nicotine  Patch 21 mg TD  Hypoalbuminemia: Patient's Albumin Trend: Recent Labs  Lab 03/04/24 1104 03/07/24 0230 03/19/24 0944 03/20/24 0617 03/22/24 0653  ALBUMIN 3.9 3.9  3.5 3.4* 3.2*  -Continue to Monitor and Trend and repeat CMP intermittently   DVT prophylaxis: SCDs Start: 03/13/24 2315    Code Status: Full Code Family Communication: No family present @ bedside   Disposition Plan:  Level of care: Med-Surg Status is: Inpatient Remains inpatient appropriate because: Does not have a save discharge disposition   Consultants:  Orthopedic Surgery  Procedures:  As delineated as above  Antimicrobials:  Anti-infectives (From admission, onward)    None       Subjective: Seen and examined at bedside and is doing okay.  Complain of some pain but feels okay.  Has not been out of bed yet.  No nausea or vomiting.  Denies any other concerns or complaints at this time.  Objective: Vitals:   03/21/24 1944 03/22/24 0310 03/22/24 0739 03/22/24 1505  BP: 129/78 (!) 154/90 (!) 149/88 (!) 150/90  Pulse: (!) 109 (!) 101 98 (!) 102  Resp: 16 16 18 16   Temp: 98.4 F (36.9 C) 98.4 F (36.9 C) 98.2 F (36.8 C) 97.9 F (36.6 C)  TempSrc: Oral Oral  Oral  SpO2: 92% 92% 92% 92%  Weight:      Height:        Intake/Output Summary (Last 24 hours) at 03/22/2024 1928 Last data filed at 03/22/2024 1305 Gross per 24 hour  Intake 480 ml  Output 1720 ml  Net -1240 ml   Filed Weights   03/15/24 1500  Weight: 68 kg   Examination: Physical Exam:  Constitutional: Chronically ill-appearing African-American male who appears calm and resting Respiratory: Diminished to auscultation bilaterally, no wheezing, rales, rhonchi or crackles. Normal respiratory effort and patient is not tachypenic. No accessory muscle use.  Unlabored breathing Cardiovascular: RRR, no murmurs / rubs / gallops. S1 and S2 auscultated. No extremity edema.  Abdomen: Soft, non-tender, non-distended. Bowel sounds positive.  GU: Deferred. Musculoskeletal: No clubbing / cyanosis of digits/nails. No joint deformity upper and lower extremities. Skin: No rashes, lesions, ulcers limited skin  evaluation. No induration; Warm and dry.  Neurologic: CN 2-12 grossly intact with no focal deficits.Romberg sign and cerebellar reflexes not assessed.  Psychiatric: Normal judgment and insight. Alert and oriented x 3. Normal mood and appropriate affect.   Data Reviewed: I have personally reviewed following labs and imaging studies  CBC: Recent Labs  Lab 03/19/24 0944 03/20/24 0617 03/22/24 0653  WBC 11.8* 10.1 10.9*  NEUTROABS 8.4* 7.0 7.2  HGB 12.5* 11.6* 10.7*  HCT 37.9* 35.1* 33.3*  MCV 92.7 92.9 94.6  PLT 685* 648* 667*   Basic Metabolic Panel: Recent Labs  Lab 03/19/24 0944 03/20/24 0617 03/22/24 0653  NA 134* 133* 135  K 4.9 4.6 4.5  CL 96* 96* 98  CO2 26 27 26   GLUCOSE 232* 296* 123*  BUN 20 22 22   CREATININE 1.02 1.09 1.03  CALCIUM  10.1 9.8 9.5  MG 1.8 1.9 1.7  PHOS 3.7 3.6 2.9   GFR: Estimated Creatinine Clearance: 69.7 mL/min (by C-G formula based on SCr of 1.03 mg/dL). Liver Function Tests: Recent Labs  Lab 03/19/24 0944 03/20/24 0617 03/22/24 0653  AST  43* 31 30  ALT 28 25 25   ALKPHOS 96 92 97  BILITOT 0.7 0.4 0.5  PROT 7.9 7.5 7.3  ALBUMIN 3.5 3.4* 3.2*   No results for input(s): LIPASE, AMYLASE in the last 168 hours. No results for input(s): AMMONIA in the last 168 hours. Coagulation Profile: No results for input(s): INR, PROTIME in the last 168 hours. Cardiac Enzymes: No results for input(s): CKTOTAL, CKMB, CKMBINDEX, TROPONINI in the last 168 hours. BNP (last 3 results) No results for input(s): PROBNP in the last 8760 hours. HbA1C: No results for input(s): HGBA1C in the last 72 hours. CBG: Recent Labs  Lab 03/21/24 1535 03/22/24 0629 03/22/24 0740 03/22/24 1113 03/22/24 1505  GLUCAP 252* 124* 83 145* 144*   Lipid Profile: No results for input(s): CHOL, HDL, LDLCALC, TRIG, CHOLHDL, LDLDIRECT in the last 72 hours. Thyroid  Function Tests: No results for input(s): TSH, T4TOTAL, FREET4,  T3FREE, THYROIDAB in the last 72 hours. Anemia Panel: No results for input(s): VITAMINB12, FOLATE, FERRITIN, TIBC, IRON, RETICCTPCT in the last 72 hours. Sepsis Labs: No results for input(s): PROCALCITON, LATICACIDVEN in the last 168 hours.  No results found for this or any previous visit (from the past 240 hours).   Radiology Studies: No results found.  Scheduled Meds:  acetaminophen   1,000 mg Oral TID   amLODipine   10 mg Oral Daily   aspirin  EC  81 mg Oral BID   atorvastatin   40 mg Oral Daily   bisacodyl   10 mg Oral Q1500   Chlorhexidine  Gluconate Cloth  6 each Topical Daily   docusate sodium   100 mg Oral BID   DULoxetine   60 mg Oral QPM   folic acid   1 mg Oral Daily   gabapentin   600 mg Oral TID   insulin  aspart  0-15 Units Subcutaneous TID WC   insulin  aspart  0-5 Units Subcutaneous QHS   insulin  aspart  5 Units Subcutaneous TID WC   insulin  glargine-yfgn  30 Units Subcutaneous Daily   isosorbide  mononitrate  30 mg Oral Daily   lactulose   20 g Oral BID   metFORMIN   1,000 mg Oral BID WC   nicotine   21 mg Transdermal Daily   polyethylene glycol  17 g Oral Daily   tamsulosin   0.4 mg Oral Daily   Continuous Infusions:   LOS: 9 days   Alejandro Marker, DO [facility] Available via Epic secure chat 7am-7pm After these hours, please refer to coverage provider listed on [email or web address] 03/22/2024, 7:28 PM  "

## 2024-03-22 NOTE — Progress Notes (Signed)
 PT Cancellation Note  Patient Details Name: David Wyatt MRN: [id number] DOB: 21-Oct-1960   Cancelled Treatment:    Reason Eval/Treat Not Completed: (P) Pain limiting ability to participate (PTA offering to have RN premedicate him, but pt requesting to wait to take IV pain meds till after his magnesium  IV finishes. PTA plan to return after 2pm as therapist has appointment with interpreter on another floor at 1pm.) Will continue efforts per PT plan of care as schedule permits.    [name] 03/22/2024, 12:43 PM

## 2024-03-23 ENCOUNTER — Inpatient Hospital Stay (HOSPITAL_COMMUNITY): Payer: MEDICAID

## 2024-03-23 ENCOUNTER — Ambulatory Visit: Payer: MEDICAID | Admitting: Family Medicine

## 2024-03-23 DIAGNOSIS — S72142A Displaced intertrochanteric fracture of left femur, initial encounter for closed fracture: Secondary | ICD-10-CM | POA: Diagnosis not present

## 2024-03-23 LAB — COMPREHENSIVE METABOLIC PANEL WITH GFR
ALT: 30 U/L (ref 0–44)
AST: 33 U/L (ref 15–41)
Albumin: 3.5 g/dL (ref 3.5–5.0)
Alkaline Phosphatase: 113 U/L (ref 38–126)
Anion gap: 9 (ref 5–15)
BUN: 23 mg/dL (ref 8–23)
CO2: 28 mmol/L (ref 22–32)
Calcium: 9.8 mg/dL (ref 8.9–10.3)
Chloride: 98 mmol/L (ref 98–111)
Creatinine, Ser: 1.08 mg/dL (ref 0.61–1.24)
GFR, Estimated: >60 mL/min
Glucose, Bld: 220 mg/dL — ABNORMAL HIGH (ref 70–99)
Potassium: 4.9 mmol/L (ref 3.5–5.1)
Sodium: 136 mmol/L (ref 135–145)
Total Bilirubin: 0.4 mg/dL (ref 0.0–1.2)
Total Protein: 7.5 g/dL (ref 6.5–8.1)

## 2024-03-23 LAB — MAGNESIUM: Magnesium: 1.9 mg/dL (ref 1.7–2.4)

## 2024-03-23 LAB — CBC WITH DIFFERENTIAL/PLATELET
Abs Immature Granulocytes: 0.08 K/uL — ABNORMAL HIGH (ref 0.00–0.07)
Basophils Absolute: 0.1 K/uL (ref 0.0–0.1)
Basophils Relative: 1 %
Eosinophils Absolute: 0.3 K/uL (ref 0.0–0.5)
Eosinophils Relative: 3 %
HCT: 34.9 % — ABNORMAL LOW (ref 39.0–52.0)
Hemoglobin: 11.3 g/dL — ABNORMAL LOW (ref 13.0–17.0)
Immature Granulocytes: 1 %
Lymphocytes Relative: 22 %
Lymphs Abs: 1.9 K/uL (ref 0.7–4.0)
MCH: 30.5 pg (ref 26.0–34.0)
MCHC: 32.4 g/dL (ref 30.0–36.0)
MCV: 94.1 fL (ref 80.0–100.0)
Monocytes Absolute: 0.8 K/uL (ref 0.1–1.0)
Monocytes Relative: 9 %
Neutro Abs: 5.3 K/uL (ref 1.7–7.7)
Neutrophils Relative %: 64 %
Platelets: 718 K/uL — ABNORMAL HIGH (ref 150–400)
RBC: 3.71 MIL/uL — ABNORMAL LOW (ref 4.22–5.81)
RDW: 13.6 % (ref 11.5–15.5)
WBC: 8.5 K/uL (ref 4.0–10.5)
nRBC: 0 % (ref 0.0–0.2)

## 2024-03-23 LAB — GLUCOSE, CAPILLARY
Glucose-Capillary: 207 mg/dL — ABNORMAL HIGH (ref 70–99)
Glucose-Capillary: 344 mg/dL — ABNORMAL HIGH (ref 70–99)
Glucose-Capillary: 54 mg/dL — ABNORMAL LOW (ref 70–99)
Glucose-Capillary: 65 mg/dL — ABNORMAL LOW (ref 70–99)
Glucose-Capillary: 126 mg/dL — ABNORMAL HIGH (ref 70–99)
Glucose-Capillary: 183 mg/dL — ABNORMAL HIGH (ref 70–99)
Glucose-Capillary: 217 mg/dL — ABNORMAL HIGH (ref 70–99)
Glucose-Capillary: 213 mg/dL — ABNORMAL HIGH (ref 70–99)

## 2024-03-23 LAB — IRON AND TIBC
Iron: 41 ug/dL — ABNORMAL LOW (ref 45–182)
Saturation Ratios: 12 % — ABNORMAL LOW (ref 17.9–39.5)
TIBC: 347 ug/dL (ref 250–450)
UIBC: 306 ug/dL

## 2024-03-23 LAB — RETICULOCYTES
Immature Retic Fract: 9.9 % (ref 2.3–15.9)
RBC.: 3.72 MIL/uL — ABNORMAL LOW (ref 4.22–5.81)
Retic Count, Absolute: 87.4 K/uL (ref 19.0–186.0)
Retic Ct Pct: 2.4 % (ref 0.4–3.1)

## 2024-03-23 LAB — VITAMIN B12: Vitamin B-12: 460 pg/mL (ref 180–914)

## 2024-03-23 LAB — PHOSPHORUS: Phosphorus: 3.8 mg/dL (ref 2.5–4.6)

## 2024-03-23 LAB — FERRITIN: Ferritin: 551 ng/mL — ABNORMAL HIGH (ref 24–336)

## 2024-03-23 LAB — FOLATE: Folate: 16.3 ng/mL

## 2024-03-23 MED ORDER — METHOCARBAMOL 500 MG PO TABS
500.0000 mg | ORAL_TABLET | Freq: Four times a day (QID) | ORAL | Status: DC | PRN
  Administered 2024-03-24 (×3): 1000 mg via ORAL
  Administered 2024-03-25: 500 mg via ORAL
  Administered 2024-03-25: 1000 mg via ORAL
  Administered 2024-03-26: 500 mg via ORAL
  Administered 2024-03-26 – 2024-03-28 (×3): 1000 mg via ORAL
  Administered 2024-03-29: 500 mg via ORAL
  Administered 2024-03-29 – 2024-03-30 (×2): 1000 mg via ORAL
  Administered 2024-03-31: 500 mg via ORAL
  Administered 2024-03-31 – 2024-04-01 (×3): 1000 mg via ORAL
  Administered 2024-04-01: 500 mg via ORAL
  Administered 2024-04-02 – 2024-04-08 (×10): 1000 mg via ORAL
  Administered 2024-04-09: 500 mg via ORAL
  Administered 2024-04-10: 1000 mg via ORAL
  Administered 2024-04-10 (×2): 500 mg via ORAL
  Filled 2024-03-23 (×6): qty 2
  Filled 2024-03-23: qty 1
  Filled 2024-03-23 (×11): qty 2
  Filled 2024-03-23: qty 1
  Filled 2024-03-23 (×3): qty 2
  Filled 2024-03-23: qty 1
  Filled 2024-03-23 (×2): qty 2
  Filled 2024-03-23 (×2): qty 1
  Filled 2024-03-23: qty 2
  Filled 2024-03-23: qty 1
  Filled 2024-03-23 (×2): qty 2

## 2024-03-23 NOTE — Progress Notes (Signed)
 " PROGRESS NOTE    David Wyatt  FMW:[id number] DOB: 10/16/1960 DOA: 03/13/2024 PCP: Delbert Clam, MD   Brief Narrative:  The patient is a 64 year old with history of uncontrolled DM2, HTN, HLD, cocaine  use, diabetic neuropathy, chronic back pain, arthritis comes to the ED after a fall leading to left hip pain.  X-ray showed left-sided hip fracture, orthopedic consulted.  Left hip closed reduction/IM fixation of left hip 1/4.  Postop management including wound care, pain control, weightbearing, follow-up per their service.  He continues to have significant pain in the hip and the leg so ordered a repeat x-ray.  Orthopedic surgery was reconsulted.  Otherwise he is stable  Foley catheter placed due to urinary retention but removed given improvement and has passed his void Trial.  Currently awaiting SNF placement but has no safe discharge disposition as he has lack of bed offers.   Assessment and Plan:  Left femoral intertrochanteric fracture:  Left hip closed reduction/IM fixation of left hip 1/4.  Postop management including wound care, pain control, weightbearing, follow-up per their service.   -Continues to have significant pain and will continue acetaminophen  1000 mg p.o. 3 times daily as well as 650 mg p.o. every 6 as needed for mild pain and oxycodone  5 to 10 mg p.o. every 4 as needed for moderate pain.  Added IV hydromorphone  0.4 mg every 4 p.o. for severe pain -VTE prophylaxis with aspirin  81 mg p.o. twice daily per the orthopedic surgeon.   -Continue bowel regimen with docusate 100 mg p.o. twice daily, MiraLAX  17 g p.o. daily and senna docusate 1 tab p.o. nightly as needed for moderate constipation.  -Added Methocarbamol  500 mg p.o. every 6 as needed for muscle spasms but will increase to [phone] mg.   -Continues to complain of significant hip pain which is limiting his ambulation so we will obtain an x-ray of the hip that showed Status post ORIF of left femoral neck fracture. No  dislocation.  -I have notified orthopedic surgery given the patient was to speak with them as well as the findings of his hip pain and Dr. Beuford to evaluate -PT/OT recommending SNF but has no safe discharge disposition at this time   Leukocytosis / Thrombocytosis: Likely reactive.  He is afebrile.  WBC resolved at 8.5 but Plt Count Trend fluctuating and continuing to trend upwards with last value being 718. CTM and Trend and repeat in the AM  Urinary Retention: Foley catheter was in place but now removed and Voiding Trial Done. Check Bladder Scan q8h; No evidence of retention   Uncontrolled Insulin -Dependent T2DM with associated Diabetic Peripheral Neuropathy: Recent A1c of 11.8 continue with Semglee  30 units subcu daily, moderate NovoLog  sign scale insulin  AC and at bedtime and metformin  1000 mg p.o. twice daily. CTM CBG's per protcol. CBG ranging from 54-344 on the last 7 checks.  -Continue with Gabapentin  600 mg p.o. 3 times daily continue with glucagon  1 mg IV as needed for low blood sugar  Hyponatremia: Mild and Fluctuating. Na+ is now 136 on last check. Repeat CMP in the AM   Essential Hypertension: Continue with Imdur  30 mg p.o. daily.  Continue with IV hydralazine  10 mg Q6 as needed for high blood pressure. CTM BP per protocol.  Last blood pressure reading was 122/79 on last check   Depression: On Duloxetine  60 mg p.o. every evening  Normocytic Anemia: Hgb/Hct Trend relatively stable w/ last Hgb/Hct being 11.3/34.9. Checked Anemia Panel showed an iron level of 41, UIBC 307, TIBC  of 347,. CTM S/Sx of Bleeding; No overt bleeding noted. Repeat CBC in the AM   History of cocaine  use:  Recommend Cessation    Hyperlipidemia: Continue Atorvastatin  40 mg p.o. Daily   Tobacco Abuse: Smoking cessation counseling given. Continue with Nicotine  Patch 21 mg TD  Hypoalbuminemia: Patient's Albumin Fluctuating and went from 3.9 x2 -> 3.5 -> 3.4 -> 3.2 -> 3.5. CTM & Trend & repeat CMP  intermittently   DVT prophylaxis: SCDs Start: 03/13/24 2315    Code Status: Full Code Family Communication: No family present @ bedside   Disposition Plan:  Level of care: Med-Surg Status is: Inpatient Remains inpatient appropriate because: Needs to go to SNF but has no bed offers.   Consultants:  Orthopedic Surgery  Procedures:  As delineated as above  Antimicrobials:  Anti-infectives (From admission, onward)    None       Subjective: Seen and examined at bedside still complaining of significant pain he has left hip and complains that it is medial and he wants to talk to the orthopedic surgeon about this.  No nausea or vomiting.  A little hypoglycemic this morning.  No chest pain or shortness of breath.  Otherwise feels okay.  Objective: Vitals:   03/22/24 2123 03/23/24 0445 03/23/24 0801 03/23/24 1423  BP: (!) 147/86 (!) 157/87 (!) 150/89 122/79  Pulse:  83 (!) 106 (!) 105  Resp: 18 18 16 17   Temp: 98 F (36.7 C) 98 F (36.7 C) 98.4 F (36.9 C) (!) 97.5 F (36.4 C)  TempSrc: Oral Oral Oral Oral  SpO2: 94% 100% 92% 92%  Weight:      Height:        Intake/Output Summary (Last 24 hours) at 03/23/2024 1842 Last data filed at 03/23/2024 0900 Gross per 24 hour  Intake 600 ml  Output 1050 ml  Net -450 ml   Filed Weights   03/15/24 1500  Weight: 68 kg   Examination: Physical Exam:  Constitutional: WN/WD African-American male in no acute distress but appears uncomfortable Respiratory: Diminished to auscultation bilaterally, no wheezing, rales, rhonchi or crackles. Normal respiratory effort and patient is not tachypenic. No accessory muscle use.  Unlabored breathing Cardiovascular: RRR, no murmurs / rubs / gallops. S1 and S2 auscultated. No extremity edema.  Abdomen: Soft, non-tender, non-distended. Bowel sounds positive.  GU: Deferred. Musculoskeletal: No clubbing / cyanosis of digits/nails. No joint deformity upper and lower extremities. Good ROM, no  contractures. Normal strength and muscle tone.  Skin: No rashes, lesions, ulcers. No induration; Warm and dry.  Neurologic: CN 2-12 grossly intact with no focal deficits. Romberg sign and cerebellar reflexes not assessed.  Psychiatric: Normal judgment and insight. Alert and oriented x 3. Anxious  Data Reviewed: I have personally reviewed following labs and imaging studies  CBC: Recent Labs  Lab 03/19/24 0944 03/20/24 0617 03/22/24 0653 03/23/24 0629  WBC 11.8* 10.1 10.9* 8.5  NEUTROABS 8.4* 7.0 7.2 5.3  HGB 12.5* 11.6* 10.7* 11.3*  HCT 37.9* 35.1* 33.3* 34.9*  MCV 92.7 92.9 94.6 94.1  PLT 685* 648* 667* 718*   Basic Metabolic Panel: Recent Labs  Lab 03/19/24 0944 03/20/24 0617 03/22/24 0653 03/23/24 0629  NA 134* 133* 135 136  K 4.9 4.6 4.5 4.9  CL 96* 96* 98 98  CO2 26 27 26 28   GLUCOSE 232* 296* 123* 220*  BUN 20 22 22 23   CREATININE 1.02 1.09 1.03 1.08  CALCIUM  10.1 9.8 9.5 9.8  MG 1.8 1.9 1.7 1.9  PHOS  3.7 3.6 2.9 3.8   GFR: Estimated Creatinine Clearance: 66.5 mL/min (by C-G formula based on SCr of 1.08 mg/dL). Liver Function Tests: Recent Labs  Lab 03/19/24 0944 03/20/24 0617 03/22/24 0653 03/23/24 0629  AST 43* 31 30 33  ALT 28 25 25 30   ALKPHOS 96 92 97 113  BILITOT 0.7 0.4 0.5 0.4  PROT 7.9 7.5 7.3 7.5  ALBUMIN 3.5 3.4* 3.2* 3.5   No results for input(s): LIPASE, AMYLASE in the last 168 hours. No results for input(s): AMMONIA in the last 168 hours. Coagulation Profile: No results for input(s): INR, PROTIME in the last 168 hours. Cardiac Enzymes: No results for input(s): CKTOTAL, CKMB, CKMBINDEX, TROPONINI in the last 168 hours. BNP (last 3 results) No results for input(s): PROBNP in the last 8760 hours. HbA1C: No results for input(s): HGBA1C in the last 72 hours. CBG: Recent Labs  Lab 03/23/24 1133 03/23/24 1152 03/23/24 1236 03/23/24 1641 03/23/24 1729  GLUCAP 54* 65* 126* 217* 183*   Lipid Profile: No results  for input(s): CHOL, HDL, LDLCALC, TRIG, CHOLHDL, LDLDIRECT in the last 72 hours. Thyroid  Function Tests: No results for input(s): TSH, T4TOTAL, FREET4, T3FREE, THYROIDAB in the last 72 hours. Anemia Panel: Recent Labs    03/23/24 0629  VITAMINB12 460  FOLATE 16.3  FERRITIN 551*  TIBC 347  IRON 41*  RETICCTPCT 2.4   Sepsis Labs: No results for input(s): PROCALCITON, LATICACIDVEN in the last 168 hours.  No results found for this or any previous visit (from the past 240 hours).   Radiology Studies: DG HIP UNILAT WITH PELVIS 2-3 VIEWS LEFT Result Date: 03/23/2024 CLINICAL DATA:  Pain. EXAM: DG HIP (WITH OR WITHOUT PELVIS) 2-3V*L* COMPARISON:  None Available. FINDINGS: Status post ORIF of left femoral neck fracture. The visualized hardware appears intact. Lower lumbar fusion hardware noted with the bones are osteopenic. There is no dislocation. The soft tissues are unremarkable. IMPRESSION: Status post ORIF of left femoral neck fracture. No dislocation. Electronically Signed   By: Vanetta Chou M.D.   On: 03/23/2024 17:06   Scheduled Meds:  acetaminophen   1,000 mg Oral TID   amLODipine   10 mg Oral Daily   aspirin  EC  81 mg Oral BID   atorvastatin   40 mg Oral Daily   bisacodyl   10 mg Oral Q1500   Chlorhexidine  Gluconate Cloth  6 each Topical Daily   docusate sodium   100 mg Oral BID   DULoxetine   60 mg Oral QPM   folic acid   1 mg Oral Daily   gabapentin   600 mg Oral TID   insulin  aspart  0-15 Units Subcutaneous TID WC   insulin  aspart  0-5 Units Subcutaneous QHS   insulin  aspart  5 Units Subcutaneous TID WC   insulin  glargine-yfgn  30 Units Subcutaneous Daily   isosorbide  mononitrate  30 mg Oral Daily   lactulose   20 g Oral BID   metFORMIN   1,000 mg Oral BID WC   nicotine   21 mg Transdermal Daily   polyethylene glycol  17 g Oral Daily   tamsulosin   0.4 mg Oral Daily   Continuous Infusions:   LOS: 10 days   Alejandro Marker, DO [facility] Available via Epic secure chat 7am-7pm After these hours, please refer to coverage provider listed on [email or web address] 03/23/2024, 6:42 PM  "

## 2024-03-23 NOTE — Plan of Care (Signed)
" °  Problem: Metabolic: Goal: Ability to maintain appropriate glucose levels will improve Outcome: Progressing   Problem: Nutritional: Goal: Maintenance of adequate nutrition will improve Outcome: Progressing   Problem: Clinical Measurements: Goal: Ability to maintain clinical measurements within normal limits will improve Outcome: Progressing   Problem: Activity: Goal: Risk for activity intolerance will decrease Outcome: Progressing   Problem: Elimination: Goal: Will not experience complications related to bowel motility Outcome: Progressing Goal: Will not experience complications related to urinary retention Outcome: Progressing   Problem: Pain Managment: Goal: General experience of comfort will improve and/or be controlled Outcome: Progressing   Problem: Safety: Goal: Ability to remain free from injury will improve Outcome: Progressing   "

## 2024-03-23 NOTE — Progress Notes (Signed)
 Mobility Specialist Progress Note:   03/23/24 1040  Mobility  Activity Pivoted/transferred from bed to chair  Level of Assistance Minimal assist, patient does 75% or more (+2)  Assistive Device Front wheel walker  Distance Ambulated (ft) 5 ft  LLE Weight Bearing Per Provider Order WBAT  Activity Response Tolerated well  Mobility Referral Yes  Mobility visit 1 Mobility  Mobility Specialist Start Time (ACUTE ONLY) 1030  Mobility Specialist Stop Time (ACUTE ONLY) 1040  Mobility Specialist Time Calculation (min) (ACUTE ONLY) 10 min   Received pt in bed and hesitant, but agreeable to mobility. Pt required MinA+2 STS, otherwise MinG for safety. Left pt in chair with alarm on. Personal belongings and call light within reach. Pt requested MS return in 20 minutes. Will f/u. All needs met.  Lavanda Pollack Mobility Specialist  Please contact via [employer] or  Rehab Office [phone]

## 2024-03-23 NOTE — Plan of Care (Signed)
   Problem: Fluid Volume: Goal: Ability to maintain a balanced intake and output will improve Outcome: Progressing

## 2024-03-23 NOTE — TOC Progression Note (Signed)
 Transition of Care [facility]) - Progression Note    Patient Details  Name: David Wyatt MRN: [id number] Date of Birth: Apr 02, 1960  [facility] CM/SW Contact  Bridget Cordella Simmonds, LCSW Phone Number: 03/23/2024, 10:22 AM  Clinical Narrative:   CSW informed by pt that he has court date 03/25/24, asking for excuse letter.  CSW spoke with the [employer] of court, who confirms this, letter can be emailed to: [email or web address].  Letter sent.  Hard copy given to pt.     Expected Discharge Plan: Skilled Nursing Facility Barriers to Discharge: Continued Medical Work up, SNF Pending bed offer               Expected Discharge Plan and Services In-house Referral: Clinical Social Work   Post Acute Care Choice: Skilled Nursing Facility Living arrangements for the past 2 months: Homeless Shelter, Homeless ([employer] and on the street) Expected Discharge Date: 03/16/24                                     Social Drivers of Health (SDOH) Interventions SDOH Screenings   Food Insecurity: Food Insecurity Present (03/15/2024)  Housing: High Risk (03/15/2024)  Transportation Needs: Unmet Transportation Needs (03/15/2024)  Utilities: At Risk (03/15/2024)  Alcohol Screen: Low Risk (12/07/2023)  Depression (PHQ2-9): Medium Risk (12/06/2023)  Financial Resource Strain: High Risk (01/15/2024)  Social Connections: Unknown (03/15/2024)  Tobacco Use: Medium Risk (03/14/2024)    Readmission Risk Interventions     No data to display

## 2024-03-23 NOTE — Progress Notes (Signed)
 Occupational Therapy Treatment Patient Details Name: David Wyatt MRN: [id number] DOB: 04/26/1960 Today's Date: 03/23/2024   History of present illness 64 y.o. male presents 03/13/24 with L hip pain. Imaging shows displaced intertrochanteric fracture on the left. S/p IM rod L hip 1/4. PMH: DM2 with neuropathy, HTN, HLD, substance use, chronic back pain, arthritis.   OT comments  Pt making progress with functional goals. Pt in bed upon arrival and agreeable to sit EOB for ADLs/slefcare tasks am and OOB activity to [facility] and chair STS to RW and SPTs. OT will continue to follow acutely to maximize level of function and safety      If plan is discharge home, recommend the following:  Assistance with cooking/housework;Direct supervision/assist for medications management;Direct supervision/assist for financial management;Assist for transportation;A lot of help with bathing/dressing/bathroom;A lot of help with walking and/or transfers   Equipment Recommendations  Other (comment) (defer)    Recommendations for Other Services      Precautions / Restrictions Precautions Precautions: Fall Recall of Precautions/Restrictions: Impaired Restrictions Weight Bearing Restrictions Per Provider Order: Yes LLE Weight Bearing Per Provider Order: Weight bearing as tolerated       Mobility Bed Mobility Overal bed mobility: Needs Assistance Bed Mobility: Supine to Sit, Sit to Supine     Supine to sit: Min assist, +2 for physical assistance Sit to supine: Min assist   General bed mobility comments: min A to manage L LE to EOB and back onto bed    Transfers Overall transfer level: Needs assistance Equipment used: Rolling walker (2 wheels) Transfers: Sit to/from Stand, Bed to chair/wheelchair/BSC Sit to Stand: Max assist, Mod assist Stand pivot transfers: Mod assist         General transfer comment: max A initial STS from bed, mod A from [facility] and chair     Balance Overall balance assessment:  Needs assistance Sitting-balance support: Bilateral upper extremity supported, Feet supported Sitting balance-Leahy Scale: Fair     Standing balance support: Bilateral upper extremity supported, During functional activity, Reliant on assistive device for balance Standing balance-Leahy Scale: Poor                             ADL either performed or assessed with clinical judgement   ADL Overall ADL's : Needs assistance/impaired     Grooming: Wash/dry hands;Wash/dry face;Set up;Supervision/safety;Sitting   Upper Body Bathing: Set up;Supervision/ safety;Sitting   Lower Body Bathing: Moderate assistance;Sitting/lateral leans   Upper Body Dressing : Set up;Supervision/safety;Sitting       Toilet Transfer: Maximal assistance;Moderate assistance;Stand-pivot;Rolling walker (2 wheels);Cueing for safety   Toileting- Clothing Manipulation and Hygiene: Maximal assistance;Sit to/from stand       Functional mobility during ADLs: Maximal assistance;Moderate assistance;Rolling walker (2 wheels);Cueing for safety      Extremity/Trunk Assessment Upper Extremity Assessment Upper Extremity Assessment: Generalized weakness;RUE deficits/detail;LUE deficits/detail RUE Deficits / Details: reports neuropathy in hands LUE Deficits / Details: reports neuropathy in hands   Lower Extremity Assessment Lower Extremity Assessment: Defer to PT evaluation        Vision Patient Visual Report: No change from baseline     Perception     Praxis     Communication Communication Communication: No apparent difficulties   Cognition Arousal: Alert                                   Following commands: Impaired Following  commands impaired: Only follows one step commands consistently, Follows one step commands with increased time      Cueing   Cueing Techniques: Verbal cues, Visual cues, Gestural cues, Tactile cues  Exercises      Shoulder Instructions       General  Comments      Pertinent Vitals/ Pain       Pain Assessment Pain Assessment: Faces Faces Pain Scale: Hurts even more Pain Location: L hip with movement Pain Descriptors / Indicators: Grimacing, Guarding, Sore Pain Intervention(s): Limited activity within patient's tolerance, Monitored during session, Premedicated before session, Repositioned  Home Living                                          Prior Functioning/Environment              Frequency  Min 2X/week        Progress Toward Goals  OT Goals(current goals can now be found in the care plan section)  Progress towards OT goals: Progressing toward goals     Plan      Co-evaluation                 AM-PAC OT 6 Clicks Daily Activity     Outcome Measure   Help from another person eating meals?: None Help from another person taking care of personal grooming?: A Little Help from another person toileting, which includes using toliet, bedpan, or urinal?: A Lot Help from another person bathing (including washing, rinsing, drying)?: A Lot Help from another person to put on and taking off regular upper body clothing?: A Little Help from another person to put on and taking off regular lower body clothing?: A Lot 6 Click Score: 16    End of Session Equipment Utilized During Treatment: Gait belt;Rolling walker (2 wheels);Other (comment) (BSC)  OT Visit Diagnosis: Unsteadiness on feet (R26.81);Muscle weakness (generalized) (M62.81);History of falling (Z91.81);Pain Pain - Right/Left: Left Pain - part of body: Hip   Activity Tolerance Patient tolerated treatment well   Patient Left in bed;with call bell/phone within reach;with bed alarm set;with nursing/sitter in room   Nurse Communication Mobility status        Time: 8791-8760 OT Time Calculation (min): 31 min  Charges: OT General Charges $OT Visit: 1 Visit OT Treatments $Self Care/Home Management : 8-22 mins $Therapeutic Activity:  8-22 mins    [name] Loose 03/23/2024, 2:02 PM

## 2024-03-24 DIAGNOSIS — S72142A Displaced intertrochanteric fracture of left femur, initial encounter for closed fracture: Secondary | ICD-10-CM | POA: Diagnosis not present

## 2024-03-24 DIAGNOSIS — G959 Disease of spinal cord, unspecified: Secondary | ICD-10-CM | POA: Diagnosis not present

## 2024-03-24 DIAGNOSIS — S72142G Displaced intertrochanteric fracture of left femur, subsequent encounter for closed fracture with delayed healing: Secondary | ICD-10-CM | POA: Diagnosis not present

## 2024-03-24 LAB — CBC WITH DIFFERENTIAL/PLATELET
Abs Immature Granulocytes: 0.09 K/uL — ABNORMAL HIGH (ref 0.00–0.07)
Basophils Absolute: 0.1 K/uL (ref 0.0–0.1)
Basophils Relative: 1 %
Eosinophils Absolute: 0.3 K/uL (ref 0.0–0.5)
Eosinophils Relative: 3 %
HCT: 34.4 % — ABNORMAL LOW (ref 39.0–52.0)
Hemoglobin: 11.4 g/dL — ABNORMAL LOW (ref 13.0–17.0)
Immature Granulocytes: 1 %
Lymphocytes Relative: 18 %
Lymphs Abs: 1.8 K/uL (ref 0.7–4.0)
MCH: 30.9 pg (ref 26.0–34.0)
MCHC: 33.1 g/dL (ref 30.0–36.0)
MCV: 93.2 fL (ref 80.0–100.0)
Monocytes Absolute: 0.9 K/uL (ref 0.1–1.0)
Monocytes Relative: 9 %
Neutro Abs: 6.4 K/uL (ref 1.7–7.7)
Neutrophils Relative %: 68 %
Platelets: 715 K/uL — ABNORMAL HIGH (ref 150–400)
RBC: 3.69 MIL/uL — ABNORMAL LOW (ref 4.22–5.81)
RDW: 13.5 % (ref 11.5–15.5)
WBC: 9.6 K/uL (ref 4.0–10.5)
nRBC: 0 % (ref 0.0–0.2)

## 2024-03-24 LAB — GLUCOSE, CAPILLARY
Glucose-Capillary: 148 mg/dL — ABNORMAL HIGH (ref 70–99)
Glucose-Capillary: 187 mg/dL — ABNORMAL HIGH (ref 70–99)
Glucose-Capillary: 149 mg/dL — ABNORMAL HIGH (ref 70–99)

## 2024-03-24 LAB — RETICULOCYTES
Immature Retic Fract: 12.9 % (ref 2.3–15.9)
RBC.: 3.69 MIL/uL — ABNORMAL LOW (ref 4.22–5.81)
Retic Count, Absolute: 78.2 K/uL (ref 19.0–186.0)
Retic Ct Pct: 2.1 % (ref 0.4–3.1)

## 2024-03-24 LAB — IRON AND TIBC
Iron: 43 ug/dL — ABNORMAL LOW (ref 45–182)
Saturation Ratios: 12 % — ABNORMAL LOW (ref 17.9–39.5)
TIBC: 364 ug/dL (ref 250–450)
UIBC: 321 ug/dL

## 2024-03-24 LAB — COMPREHENSIVE METABOLIC PANEL WITH GFR
ALT: 28 U/L (ref 0–44)
AST: 29 U/L (ref 15–41)
Albumin: 3.5 g/dL (ref 3.5–5.0)
Alkaline Phosphatase: 122 U/L (ref 38–126)
Anion gap: 11 (ref 5–15)
BUN: 23 mg/dL (ref 8–23)
CO2: 26 mmol/L (ref 22–32)
Calcium: 9.7 mg/dL (ref 8.9–10.3)
Chloride: 99 mmol/L (ref 98–111)
Creatinine, Ser: 1.09 mg/dL (ref 0.61–1.24)
GFR, Estimated: >60 mL/min
Glucose, Bld: 164 mg/dL — ABNORMAL HIGH (ref 70–99)
Potassium: 4.8 mmol/L (ref 3.5–5.1)
Sodium: 137 mmol/L (ref 135–145)
Total Bilirubin: 0.4 mg/dL (ref 0.0–1.2)
Total Protein: 7.5 g/dL (ref 6.5–8.1)

## 2024-03-24 LAB — VITAMIN B12: Vitamin B-12: 515 pg/mL (ref 180–914)

## 2024-03-24 LAB — FOLATE: Folate: 17.3 ng/mL

## 2024-03-24 LAB — MAGNESIUM: Magnesium: 1.7 mg/dL (ref 1.7–2.4)

## 2024-03-24 LAB — FERRITIN: Ferritin: 572 ng/mL — ABNORMAL HIGH (ref 24–336)

## 2024-03-24 LAB — PHOSPHORUS: Phosphorus: 3.7 mg/dL (ref 2.5–4.6)

## 2024-03-24 MED ORDER — GABAPENTIN 300 MG PO CAPS
300.0000 mg | ORAL_CAPSULE | Freq: Three times a day (TID) | ORAL | Status: DC
  Administered 2024-03-24 – 2024-03-30 (×18): 300 mg via ORAL
  Filled 2024-03-24 (×18): qty 1

## 2024-03-24 MED ORDER — ENOXAPARIN SODIUM 40 MG/0.4ML IJ SOSY: 40.0000 mg | PREFILLED_SYRINGE | INTRAMUSCULAR | Status: DC

## 2024-03-24 MED ORDER — PREGABALIN 75 MG PO CAPS
75.0000 mg | ORAL_CAPSULE | Freq: Two times a day (BID) | ORAL | Status: DC
  Administered 2024-03-24 – 2024-03-28 (×9): 75 mg via ORAL
  Filled 2024-03-24 (×9): qty 1

## 2024-03-24 NOTE — Progress Notes (Signed)
 "  David Wyatt  FMW:[id number] DOB: 1960-10-14 DOA: 03/13/2024 PCP: Delbert Clam, MD    Brief Narrative:  64 year old with a history of uncontrolled DM2, HTN, HLD, cocaine  abuse, diabetic neuropathy, chronic back pain, and arthritis who presented to the ER 1/3 with left hip pain following a mechanical fall.  X-rays confirmed a left hip fracture.    Goals of Care:   Code Status: Full Code   DVT prophylaxis: SCDs Start: 03/13/24 2315 ASA 81mg  BID   Interim Hx: Afebrile.  Vital signs stable.  CBG reasonably controlled.  Hemoglobin stable.  Reports ongoing severe left leg pain as previously reported with no significant improvement in last 24 hours.  No shortness of breath or chest pain.  Assessment & Plan:  Left hip intertrochanteric femur fracture Closed reduction and IM fixation left hip 03/14/2024 - has had persisting severe left hip pain postoperatively -follow-up x-rays revealed no dislocation or obvious complications - postop care per Ortho as follows: - up with PT/OT, encourage ambulation/mvmt as much as possible  - F/U outpt office 6 weeks PO  - Can remove staples and dressing at any time now, remove before day 14 PO  Persisting left leg pain Sounds neuropathic in description -wean Neurontin  down and give trial of Lyrica   Acute urinary retention Required transient utilization of Foley catheter but this has now been removed  Uncontrolled DM2 with hyperglycemia and peripheral neuropathy A1c 11.8 -CBG reasonably controlled as inpatient -continue to follow  HTN Blood pressure well-controlled at present  Depression Continue his usual dose of duloxetine   Normocytic anemia No gross blood loss appreciable  Cocaine  abuse Has been counseled on need to discontinue cocaine  use  HLD Continue atorvastatin   Tobacco abuse Has been counseled on the need to discontinue tobacco abuse    Family Communication: No family present at time of exam Disposition:   Skilled Nursing-Short  Term Rehab (<3 Hours/Day)03/22/2024 1400  Objective: Blood pressure (!) 151/95, pulse 92, temperature 98.1 F (36.7 C), temperature source Oral, resp. rate 16, height 5' 8 (1.727 m), weight 68 kg, SpO2 96%.  Intake/Output Summary (Last 24 hours) at 03/24/2024 1138 Last data filed at 03/24/2024 0856 Gross per 24 hour  Intake 240 ml  Output 850 ml  Net -610 ml   Filed Weights   03/15/24 1500  Weight: 68 kg    Examination: General: No acute respiratory distress Lungs: Clear to auscultation bilaterally without wheezes or crackles Cardiovascular: Regular rate and rhythm without murmur gallop or rub normal S1 and S2 Abdomen: Nontender, nondistended, soft, bowel sounds positive, no rebound, no ascites, no appreciable mass Extremities: No significant edema   CBC: Recent Labs  Lab 03/22/24 0653 03/23/24 0629 03/24/24 0522  WBC 10.9* 8.5 9.6  NEUTROABS 7.2 5.3 6.4  HGB 10.7* 11.3* 11.4*  HCT 33.3* 34.9* 34.4*  MCV 94.6 94.1 93.2  PLT 667* 718* 715*   Basic Metabolic Panel: Recent Labs  Lab 03/22/24 0653 03/23/24 0629 03/24/24 0522  NA 135 136 137  K 4.5 4.9 4.8  CL 98 98 99  CO2 26 28 26   GLUCOSE 123* 220* 164*  BUN 22 23 23   CREATININE 1.03 1.08 1.09  CALCIUM  9.5 9.8 9.7  MG 1.7 1.9 1.7  PHOS 2.9 3.8 3.7   GFR: Estimated Creatinine Clearance: 65.9 mL/min (by C-G formula based on SCr of 1.09 mg/dL).   Scheduled Meds:  amLODipine   10 mg Oral Daily   aspirin  EC  81 mg Oral BID   atorvastatin   40 mg Oral  Daily   bisacodyl   10 mg Oral Q1500   Chlorhexidine  Gluconate Cloth  6 each Topical Daily   docusate sodium   100 mg Oral BID   DULoxetine   60 mg Oral QPM   folic acid   1 mg Oral Daily   gabapentin   600 mg Oral TID   insulin  aspart  0-15 Units Subcutaneous TID WC   insulin  aspart  0-5 Units Subcutaneous QHS   insulin  aspart  5 Units Subcutaneous TID WC   insulin  glargine-yfgn  30 Units Subcutaneous Daily   isosorbide  mononitrate  30 mg Oral Daily   lactulose    20 g Oral BID   metFORMIN   1,000 mg Oral BID WC   nicotine   21 mg Transdermal Daily   polyethylene glycol  17 g Oral Daily   tamsulosin   0.4 mg Oral Daily   Continuous Infusions:   LOS: 11 days   Reyes IVAR Moores, MD [facility] Office  [phone] Pager - Text Page per Tracey  If 7PM-7AM, please contact night-coverage per Amion 03/24/2024, 11:38 AM     "

## 2024-03-24 NOTE — Progress Notes (Signed)
 Physical Therapy Treatment Patient Details Name: David Wyatt MRN: [id number] DOB: 17-Nov-1960 Today's Date: 03/24/2024   History of Present Illness 64 y.o. male presents 03/13/24 with L hip pain. Imaging shows displaced intertrochanteric fracture on the left. S/p IM rod L hip 1/4. PMH: DM2 with neuropathy, HTN, HLD, substance use, chronic back pain, arthritis.    PT Comments  Pt greeted supine in bed, pleasant and agreeable to PT session if he could get pain medication. RN entered to provide pt with his pain medication. He c/o 10/10 left posterior and medial thigh pain, which he reports is nerve related. Pt relayed that he was supposed to be getting a special new medication to address his pain per the doctor, deferred to RN/MD. Educated pt on the importance on participate in therapy to aid in his recovery and the negative effects associated with prolonged bed rest. Pt engaged in short-distance within room ambulation using RW. He required min-modA x2 and demonstrated multiple gait abnormalities. Moderate cues for improved technique with minimal correction noted. Patient will benefit from continued inpatient follow up therapy, <3 hours/day.    If plan is discharge home, recommend the following: Two people to help with walking and/or transfers;Two people to help with bathing/dressing/bathroom;Assist for transportation;Assistance with cooking/housework;Help with stairs or ramp for entrance   Can travel by private vehicle     No  Equipment Recommendations  Wheelchair (measurements PT);Wheelchair cushion (measurements PT);BSC/3in1;Rollator (4 wheels)    Recommendations for Other Services       Precautions / Restrictions Precautions Precautions: Fall Recall of Precautions/Restrictions: Impaired Restrictions Weight Bearing Restrictions Per Provider Order: Yes LLE Weight Bearing Per Provider Order: Weight bearing as tolerated     Mobility  Bed Mobility Overal bed mobility: Needs Assistance Bed  Mobility: Supine to Sit, Sit to Supine     Supine to sit: Mod assist, HOB elevated, Used rails, +2 for safety/equipment Sit to supine: Mod assist, +2 for safety/equipment   General bed mobility comments: Pt sat up on L side of bed with increased time. Assist to manage BLE. Pt limited by pain. Cues for sequencing and moderate encouragement to initate. Pt scooted fwd with BUE support to EOB.    Transfers Overall transfer level: Needs assistance Equipment used: Rolling walker (2 wheels) Transfers: Sit to/from Stand Sit to Stand: Mod assist, Max assist           General transfer comment: Pt stood from lowest bed height and recliner chair. Cued proper hand placement using RW. Powered up with modA and increased time to achieve upright posture. Pt self-limiting WBing on LLE d/t pain. Fair eccentric control.    Ambulation/Gait Ambulation/Gait assistance: Min assist, Mod assist, +2 safety/equipment (chair follow) Gait Distance (Feet): 6 Feet (x2, seated rest break between bouts) Assistive device: Rolling walker (2 wheels) Gait Pattern/deviations: Step-to pattern, Decreased step length - right, Decreased stance time - left, Decreased stride length, Decreased step length - left, Decreased weight shift to left, Antalgic, Trunk flexed, Narrow base of support Gait velocity: significantly decreased     General Gait Details: Pt ambulated with short slow laborious steps with heavy reliance on BUE support on RW to offload. He demonstrated excessive fwd lean. Cue for upright posture with hips/pelvis tucked. Pt frequently only did TDWB on LLE. Distance limited d/t pain and fatigue.   Stairs             Wheelchair Mobility     Tilt Bed    Modified Rankin (Stroke Patients Only)  Balance Overall balance assessment: Needs assistance Sitting-balance support: Bilateral upper extremity supported, Feet supported Sitting balance-Leahy Scale: Fair Sitting balance - Comments: Sat EOB. Pt  attempting to offload LLE at all times. Postural control: Right lateral lean Standing balance support: Bilateral upper extremity supported, During functional activity, Reliant on assistive device for balance Standing balance-Leahy Scale: Poor Standing balance comment: Pt dependent on RW and support of PT                            Communication Communication Communication: No apparent difficulties  Cognition Arousal: Alert Behavior During Therapy: WFL for tasks assessed/performed, Anxious   PT - Cognitive impairments: No family/caregiver present to determine baseline, Attention, Initiation, Sequencing, Problem solving, Safety/Judgement                       PT - Cognition Comments: Pt internally distracted and fixated on pain. Decreased insight into current condition. Following commands: Impaired Following commands impaired: Only follows one step commands consistently, Follows one step commands with increased time    Cueing Cueing Techniques: Verbal cues, Visual cues, Gestural cues, Tactile cues  Exercises      General Comments        Pertinent Vitals/Pain Pain Assessment Pain Assessment: 0-10 Pain Score: 10-Worst pain ever Pain Location: L posterior and medial thigh Pain Descriptors / Indicators: Discomfort, Aching, Tingling Pain Intervention(s): Monitored during session, Patient requesting pain meds-RN notified, RN gave pain meds during session, Limited activity within patient's tolerance, Repositioned    Home Living                          Prior Function            PT Goals (current goals can now be found in the care plan section) Acute Rehab PT Goals Patient Stated Goal: Have less pain PT Goal Formulation: Patient unable to participate in goal setting Time For Goal Achievement: 03/29/24 Potential to Achieve Goals: Good Progress towards PT goals: Progressing toward goals    Frequency    Min 2X/week      PT Plan       Co-evaluation              AM-PAC PT 6 Clicks Mobility   Outcome Measure  Help needed turning from your back to your side while in a flat bed without using bedrails?: A Lot Help needed moving from lying on your back to sitting on the side of a flat bed without using bedrails?: Total Help needed moving to and from a bed to a chair (including a wheelchair)?: A Lot Help needed standing up from a chair using your arms (e.g., wheelchair or bedside chair)?: A Lot Help needed to walk in hospital room?: Total Help needed climbing 3-5 steps with a railing? : Total 6 Click Score: 9    End of Session Equipment Utilized During Treatment: Gait belt Activity Tolerance: Patient limited by pain;Patient limited by fatigue Patient left: in bed;with call bell/phone within reach;with bed alarm set Nurse Communication: Mobility status;Other (comment);Patient requests pain meds (request for sleeping pill) PT Visit Diagnosis: Unsteadiness on feet (R26.81);Pain;Other abnormalities of gait and mobility (R26.89);Muscle weakness (generalized) (M62.81) Pain - Right/Left: Left Pain - part of body: Hip;Leg     Time: 8468-8444 PT Time Calculation (min) (ACUTE ONLY): 24 min  Charges:    $Gait Training: 8-22 mins $Therapeutic Activity: 8-22 mins PT General  Charges $$ ACUTE PT VISIT: 1 Visit                     Randall SAUNDERS, PT, DPT Acute Rehabilitation Services Office: [phone] Secure Chat Preferred  Delon CHRISTELLA Callander 03/24/2024, 4:33 PM

## 2024-03-24 NOTE — TOC Progression Note (Signed)
 Transition of Care [facility]) - Progression Note    Patient Details  Name: David Wyatt MRN: [id number] Date of Birth: 1960/05/27  [facility] of [facility]) CM/SW Contact  Bridget Cordella Simmonds, LCSW Phone Number: 03/24/2024, 4:50 PM  Clinical Narrative: Pt situation staffed with [facility] supervisor Josefa Jes.  She will look at possible SNF options.  Asked that CSW call [employer] to inquire about single case agreement.     Expected Discharge Plan: Skilled Nursing Facility Barriers to Discharge: Continued Medical Work up, SNF Pending bed offer               Expected Discharge Plan and Services In-house Referral: Clinical Social Work   Post Acute Care Choice: Skilled Nursing Facility Living arrangements for the past 2 months: Homeless Shelter, Homeless ([employer] and on the street) Expected Discharge Date: 03/16/24                                     Social Drivers of Health (SDOH) Interventions SDOH Screenings   Food Insecurity: Food Insecurity Present (03/15/2024)  Housing: High Risk (03/15/2024)  Transportation Needs: Unmet Transportation Needs (03/15/2024)  Utilities: At Risk (03/15/2024)  Alcohol Screen: Low Risk (12/07/2023)  Depression (PHQ2-9): Medium Risk (12/06/2023)  Financial Resource Strain: High Risk (01/15/2024)  Social Connections: Unknown (03/15/2024)  Tobacco Use: Medium Risk (03/14/2024)    Readmission Risk Interventions     No data to display

## 2024-03-24 NOTE — Progress Notes (Signed)
" ° ° °  Patient 2 weeks S/P L intertrochanteric hip fx with intramedullary fixation by Dr Beuford on 03/15/2023. He continues to report hip pain and PO Xrays were ordered and we were asked to re-consult. He states he is some better but continues to have substantial pain about the L hip and finds it difficult to move the leg and ambulate, he has not been wanting to participate in PT.  PO radiographs of the L hip dated 03/23/2024 were reviewed. These demonstrate appropriate positioning of his hardware and no complications. 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 He does have moderate DJD of the L hip that may be contributing to his ongoing discomfort. He may wish to f/u with orthopedist regarding this post discharge for potential hip injection in the future. I did discuss the importance of movement and PT and activity to aide his recovery and help with pain from his surgery as well as underlying DJD. He understands the importance of movement even if uncomfortable and was reassured that mechanically all is well with his surgery. He can progress as tolerated. We agree with the addition of muscle relaxants as this will likely benefit him. Push movement. OK to remove staples starting today, do not leave for greater than 14 days. Can shower normally over the area after removal. F/U outpt at 6 weeks PO  POD #10 s/p L intertrochanteric hip fx with intramedullary fixation by Dr Beuford on 03/15/2023  - up with PT/OT, encourage ambulation/mvmt as much as possible  - continue current pain regimen and DVT prophylaxis through medical service, we appreciate their care of this pt  - F/U outpt I office 6 weeks PO  - Can remove staples and dressing at any time now, remove before day 14 PO  Chaos Carlile, PA-C  Orthopedic Spine Surgery  [facility], Division of [facility] "

## 2024-03-25 ENCOUNTER — Encounter (HOSPITAL_COMMUNITY): Payer: Self-pay

## 2024-03-25 ENCOUNTER — Inpatient Hospital Stay (HOSPITAL_COMMUNITY): Payer: MEDICAID

## 2024-03-25 DIAGNOSIS — S72142G Displaced intertrochanteric fracture of left femur, subsequent encounter for closed fracture with delayed healing: Secondary | ICD-10-CM | POA: Diagnosis not present

## 2024-03-25 DIAGNOSIS — G959 Disease of spinal cord, unspecified: Secondary | ICD-10-CM | POA: Diagnosis not present

## 2024-03-25 LAB — GLUCOSE, CAPILLARY
Glucose-Capillary: 149 mg/dL — ABNORMAL HIGH (ref 70–99)
Glucose-Capillary: 209 mg/dL — ABNORMAL HIGH (ref 70–99)
Glucose-Capillary: 75 mg/dL (ref 70–99)
Glucose-Capillary: 129 mg/dL — ABNORMAL HIGH (ref 70–99)

## 2024-03-25 MED ORDER — SENNA 8.6 MG PO TABS
1.0000 | ORAL_TABLET | Freq: Every day | ORAL | Status: DC
  Administered 2024-03-25 – 2024-04-11 (×15): 8.6 mg via ORAL
  Filled 2024-03-25 (×15): qty 1

## 2024-03-25 NOTE — Progress Notes (Signed)
 Physical Therapy Treatment Patient Details Name: David Wyatt MRN: [id number] DOB: 11-18-1960 Today's Date: 03/25/2024   History of Present Illness 64 y.o. male presents 03/13/24 with L hip pain. Imaging shows displaced intertrochanteric fracture on the left. S/p IM rod L hip 1/4. PMH: DM2 with neuropathy, HTN, HLD, substance use, chronic back pain, arthritis.    PT Comments  Pt resting in bed, agreeable with encouragement and reports 8/10 pain to L hip, groin, and anterior/medial tight; RN provided meds at start of session. MD arrived shortly after session, reviewed pain levels and recovery; MD with concerns for pain origin coming from back due to patient reported pain along L1-L2 myotome, plan to order MRI. Session focused on supine HEP, print out provided to pt and completed all exercises with cues for proper mechanics as needed. Encouraged pt to complete HEP x2/day. He will continue to benefit from skilled therapy intervention to address decreased strength, endurance, mobility and elevated pain.    If plan is discharge home, recommend the following: Two people to help with walking and/or transfers;Two people to help with bathing/dressing/bathroom;Assist for transportation;Assistance with cooking/housework;Help with stairs or ramp for entrance   Can travel by private vehicle     No  Equipment Recommendations  Wheelchair (measurements PT);Wheelchair cushion (measurements PT);BSC/3in1;Rollator (4 wheels)    Recommendations for Other Services       Precautions / Restrictions Precautions Precautions: Fall Recall of Precautions/Restrictions: Impaired Precaution/Restrictions Comments: aware of WB precs but needs reinforcement of safety due to distraction with pain. Restrictions Weight Bearing Restrictions Per Provider Order: Yes LLE Weight Bearing Per Provider Order: Weight bearing as tolerated     Mobility  Bed Mobility                    Transfers                         Ambulation/Gait                   Stairs             Wheelchair Mobility     Tilt Bed    Modified Rankin (Stroke Patients Only)       Balance                                            Communication Communication Communication: No apparent difficulties  Cognition Arousal: Alert Behavior During Therapy: WFL for tasks assessed/performed, Anxious   PT - Cognitive impairments: No family/caregiver present to determine baseline, Attention, Initiation, Sequencing, Problem solving, Safety/Judgement                       PT - Cognition Comments: Pt internally distracted and fixated on pain. Decreased insight into current condition. Following commands: Impaired Following commands impaired: Only follows one step commands consistently, Follows one step commands with increased time    Cueing Cueing Techniques: Verbal cues, Visual cues, Gestural cues, Tactile cues  Exercises Other Exercises Other Exercises: HEP provided, x10 of the following BLE with use of gait belt to aid in LLE movement as needed: SLR, heel slides, hip abduction, hip adduciton squeeze, glute set    General Comments        Pertinent Vitals/Pain Pain Assessment Pain Assessment: 0-10 Pain Score: 8  Pain Location: L posterior and medial thigh,  L anterior hip near groin Pain Descriptors / Indicators: Discomfort, Aching, Tingling, Burning Pain Intervention(s): Limited activity within patient's tolerance, Monitored during session, Repositioned    Home Living                          Prior Function            PT Goals (current goals can now be found in the care plan section) Acute Rehab PT Goals Patient Stated Goal: Have less pain PT Goal Formulation: With patient Time For Goal Achievement: 03/29/24 Potential to Achieve Goals: Good Progress towards PT goals: Progressing toward goals    Frequency    Min 2X/week      PT Plan       Co-evaluation              AM-PAC PT 6 Clicks Mobility   Outcome Measure  Help needed turning from your back to your side while in a flat bed without using bedrails?: A Lot Help needed moving from lying on your back to sitting on the side of a flat bed without using bedrails?: A Lot Help needed moving to and from a bed to a chair (including a wheelchair)?: A Lot Help needed standing up from a chair using your arms (e.g., wheelchair or bedside chair)?: A Lot Help needed to walk in hospital room?: Total Help needed climbing 3-5 steps with a railing? : Total 6 Click Score: 10    End of Session   Activity Tolerance: Patient limited by pain;Patient tolerated treatment well Patient left: in bed;with call bell/phone within reach;with bed alarm set Nurse Communication: Mobility status PT Visit Diagnosis: Unsteadiness on feet (R26.81);Pain;Other abnormalities of gait and mobility (R26.89);Muscle weakness (generalized) (M62.81) Pain - Right/Left: Left Pain - part of body: Hip;Leg     Time: 8679-8654 PT Time Calculation (min) (ACUTE ONLY): 25 min  Charges:    $Therapeutic Exercise: 23-37 mins                       Isaiah DEL. Bhavana Kady, PT, DPT   [employer] 03/25/2024, 1:49 PM

## 2024-03-25 NOTE — TOC Progression Note (Signed)
 Transition of Care [facility]) - Progression Note    Patient Details  Name: David Wyatt MRN: [id number] Date of Birth: 1960-12-08  [facility]) CM/SW Contact  Bridget Cordella Simmonds, LCSW Phone Number: 03/25/2024, 2:24 PM  Clinical Narrative:   CSW spoke with Evertt DEL at [facility], [phone] regarding potential single case agreement for pt SNF rehab.  She reports this is possibility, need to identify facility and then fax auth request to [phone] with a cover sheet that indicates we are requesting single case agreement.  ICM supervisor Josefa updated.     Expected Discharge Plan: Skilled Nursing Facility Barriers to Discharge: Continued Medical Work up, SNF Pending bed offer               Expected Discharge Plan and Services In-house Referral: Clinical Social Work   Post Acute Care Choice: Skilled Nursing Facility Living arrangements for the past 2 months: Homeless Shelter, Homeless ([employer] and on the street) Expected Discharge Date: 03/16/24                                     Social Drivers of Health (SDOH) Interventions SDOH Screenings   Food Insecurity: Food Insecurity Present (03/15/2024)  Housing: High Risk (03/15/2024)  Transportation Needs: Unmet Transportation Needs (03/15/2024)  Utilities: At Risk (03/15/2024)  Alcohol Screen: Low Risk (12/07/2023)  Depression (PHQ2-9): Medium Risk (12/06/2023)  Financial Resource Strain: High Risk (01/15/2024)  Social Connections: Unknown (03/15/2024)  Tobacco Use: Medium Risk (03/14/2024)    Readmission Risk Interventions     No data to display

## 2024-03-25 NOTE — Progress Notes (Signed)
 "  David Wyatt  FMW:[id number] DOB: 1960-06-25 DOA: 03/13/2024 PCP: Delbert Clam, MD    Brief Narrative:  64 year old with a history of uncontrolled DM2, HTN, HLD, cocaine  abuse, diabetic neuropathy, chronic back pain, and arthritis who presented to the ER 1/3 with left hip pain following a mechanical fall.  X-rays confirmed a left hip fracture.    Goals of Care:   Code Status: Full Code   DVT prophylaxis: SCDs Start: 03/13/24 2315 ASA 81mg  BID   Interim Hx: No acute events recorded overnight.  Afebrile.  Vital signs stable.  CBG controlled.  Patient reporting ongoing pain in the left hip which he explains extends from the lateral aspect of the hip and radiates around through the medial thigh down the leg.  He is also reporting severe weakness of the leg with the inability to raise the leg off the bed or to flex at the knee without assistance.  When questioned he does feel that he has some mild nagging low back pain as well.  He denies chest pain or shortness of breath.  No loss of bowel or bladder continence.  Assessment & Plan:  Left hip intertrochanteric femur fracture Closed reduction and IM fixation left hip 03/14/2024 - has had persisting severe left hip pain postoperatively -follow-up x-rays revealed no dislocation or obvious complications - postop care per Ortho as follows: - up with PT/OT, encourage ambulation/mvmt as much as possible  - F/U outpt office 6 weeks PO  - Can remove staples and dressing at any time now, remove before day 14 PO  Persisting left leg pain - weakness of L quadriceps group Sounds neuropathic in description - wean Neurontin  down and give trial of Lyrica  -given significant weakness of left quadriceps on exam today I am obtaining an MRI of the lumbar spine to rule out nerve compression/myelopathy  Acute urinary retention Required transient utilization of Foley catheter but this has now been removed  Uncontrolled DM2 with hyperglycemia and peripheral  neuropathy A1c 11.8 - CBG controlled as inpatient - continue to follow  HTN Blood pressure well-controlled at present  Depression Continue his usual dose of duloxetine   Normocytic anemia No gross blood loss appreciable  Cocaine  abuse Has been counseled on need to discontinue cocaine  use  HLD Continue atorvastatin   Tobacco abuse Has been counseled on the need to discontinue tobacco abuse    Family Communication: No family present at time of exam Disposition:   Skilled Nursing-Short Term Rehab (<3 Hours/Day)03/24/2024 1600  Objective: Blood pressure 123/67, pulse 93, temperature 98.6 F (37 C), temperature source Oral, resp. rate 18, height 5' 8 (1.727 m), weight 68 kg, SpO2 95%.  Intake/Output Summary (Last 24 hours) at 03/25/2024 0955 Last data filed at 03/25/2024 0900 Gross per 24 hour  Intake 1480 ml  Output 1450 ml  Net 30 ml   Filed Weights   03/15/24 1500  Weight: 68 kg    Examination: General: No acute respiratory distress Lungs: Clear to auscultation bilaterally without wheezes or crackles Cardiovascular: Regular rate and rhythm without murmur gallop or rub normal S1 and S2 Abdomen: Nontender, nondistended, soft, bowel sounds positive, no rebound, no ascites, no appreciable mass Extremities: No significant edema - intact sensation touch th/o L LE - unable to raise L leg off bed - when assistance given to flex knee and initiate leg raise patient then able to weakly raise leg - extension at the knee is intact - no babinski - L foot warm   CBC: Recent Labs  Lab  03/22/24 0653 03/23/24 0629 03/24/24 0522  WBC 10.9* 8.5 9.6  NEUTROABS 7.2 5.3 6.4  HGB 10.7* 11.3* 11.4*  HCT 33.3* 34.9* 34.4*  MCV 94.6 94.1 93.2  PLT 667* 718* 715*   Basic Metabolic Panel: Recent Labs  Lab 03/22/24 0653 03/23/24 0629 03/24/24 0522  NA 135 136 137  K 4.5 4.9 4.8  CL 98 98 99  CO2 26 28 26   GLUCOSE 123* 220* 164*  BUN 22 23 23   CREATININE 1.03 1.08 1.09  CALCIUM   9.5 9.8 9.7  MG 1.7 1.9 1.7  PHOS 2.9 3.8 3.7   GFR: Estimated Creatinine Clearance: 65.9 mL/min (by C-G formula based on SCr of 1.09 mg/dL).   Scheduled Meds:  amLODipine   10 mg Oral Daily   aspirin  EC  81 mg Oral BID   atorvastatin   40 mg Oral Daily   bisacodyl   10 mg Oral Q1500   Chlorhexidine  Gluconate Cloth  6 each Topical Daily   docusate sodium   100 mg Oral BID   DULoxetine   60 mg Oral QPM   folic acid   1 mg Oral Daily   gabapentin   300 mg Oral TID   insulin  aspart  0-15 Units Subcutaneous TID WC   insulin  aspart  0-5 Units Subcutaneous QHS   insulin  aspart  5 Units Subcutaneous TID WC   insulin  glargine-yfgn  30 Units Subcutaneous Daily   isosorbide  mononitrate  30 mg Oral Daily   lactulose   20 g Oral BID   metFORMIN   1,000 mg Oral BID WC   nicotine   21 mg Transdermal Daily   polyethylene glycol  17 g Oral Daily   pregabalin   75 mg Oral BID   tamsulosin   0.4 mg Oral Daily   Continuous Infusions:   LOS: 12 days   Reyes IVAR Moores, MD [facility] Office  [phone] Pager - Text Page per Tracey  If 7PM-7AM, please contact night-coverage per Amion 03/25/2024, 9:55 AM     "

## 2024-03-26 DIAGNOSIS — G959 Disease of spinal cord, unspecified: Secondary | ICD-10-CM | POA: Diagnosis not present

## 2024-03-26 DIAGNOSIS — S72142G Displaced intertrochanteric fracture of left femur, subsequent encounter for closed fracture with delayed healing: Secondary | ICD-10-CM | POA: Diagnosis not present

## 2024-03-26 LAB — GLUCOSE, CAPILLARY
Glucose-Capillary: 205 mg/dL — ABNORMAL HIGH (ref 70–99)
Glucose-Capillary: 125 mg/dL — ABNORMAL HIGH (ref 70–99)
Glucose-Capillary: 140 mg/dL — ABNORMAL HIGH (ref 70–99)

## 2024-03-26 MED ORDER — KETOROLAC TROMETHAMINE 15 MG/ML IJ SOLN
15.0000 mg | Freq: Three times a day (TID) | INTRAMUSCULAR | Status: AC
  Administered 2024-03-26 – 2024-03-29 (×9): 15 mg via INTRAVENOUS
  Filled 2024-03-26 (×9): qty 1

## 2024-03-26 MED ORDER — TRAZODONE HCL 50 MG PO TABS
100.0000 mg | ORAL_TABLET | Freq: Every evening | ORAL | Status: DC | PRN
  Administered 2024-03-26 – 2024-03-28 (×3): 100 mg via ORAL
  Filled 2024-03-26 (×3): qty 2

## 2024-03-26 MED ORDER — HYDROMORPHONE HCL 1 MG/ML IJ SOLN
0.5000 mg | Freq: Four times a day (QID) | INTRAMUSCULAR | Status: DC | PRN
  Administered 2024-03-27 – 2024-03-28 (×5): 0.5 mg via INTRAVENOUS
  Filled 2024-03-26 (×5): qty 0.5

## 2024-03-26 MED ORDER — HYDROMORPHONE HCL 1 MG/ML IJ SOLN: 0.5000 mg | INTRAMUSCULAR | Status: DC | PRN

## 2024-03-26 MED ORDER — OXYCODONE HCL 5 MG PO TABS
10.0000 mg | ORAL_TABLET | ORAL | Status: DC | PRN
  Administered 2024-03-26 – 2024-04-12 (×57): 10 mg via ORAL
  Filled 2024-03-26 (×62): qty 2

## 2024-03-26 NOTE — Plan of Care (Signed)
  Problem: Skin Integrity: Goal: Risk for impaired skin integrity will decrease Outcome: Progressing   Problem: Clinical Measurements: Goal: Will remain free from infection Outcome: Progressing   Problem: Activity: Goal: Risk for activity intolerance will decrease Outcome: Progressing

## 2024-03-26 NOTE — Progress Notes (Signed)
 "  David Wyatt  FMW:[id number] DOB: [birth year] DOA: 03/13/2024 PCP: Delbert Clam, MD    Brief Narrative:  64 year old with a history of uncontrolled DM2, HTN, HLD, cocaine  abuse, diabetic neuropathy, chronic back pain, and arthritis who presented to the ER 1/3 with left hip pain following a mechanical fall.  X-rays confirmed a left hip fracture.    Goals of Care:   Code Status: Full Code   DVT prophylaxis: SCDs Start: 03/13/24 2315 ASA 81mg  BID   Interim Hx: No acute events recorded overnight.  Afebrile.  Vital signs stable.  CBG controlled.  MRI lumbar spine yesterday evening with no acute abnormalities.  When I entered the room the patient is sitting up in a bedside chair.  He tells me he has experienced significant improvement in his left lower extremity.  He shows me that he can now flex at the hip when sitting down.  He continues to flex at the knee without difficulty.  He does report some ongoing low back pain but states that it has significantly improved overall.  He complains of difficulty sleeping at night.  Assessment & Plan:  Left hip intertrochanteric femur fracture Closed reduction and IM fixation left hip 03/14/2024 - has had persisting severe left hip pain postoperatively -follow-up x-rays revealed no dislocation or obvious complications - postop care per Ortho as follows: - up with PT/OT, encourage ambulation/mvmt as much as possible  - F/U outpt office 6 weeks PO  - Can remove staples and dressing at any time now, remove before day 14 PO  Persisting left leg pain - weakness of L quadriceps group - Hx of L4-L5 PLIF Sounds neuropathic in description - wean Neurontin  down and give trial of Lyrica  - MRI noted no acute findings but did note mild to mod B L4 foraminal narrowing due to endplate spurring and facet degeneration, as well as severe spinal stenosis at L3-4 w/ mod to severe B L3 foraminal narrowing and L foraminal extraforaminal disc protrusions at L1-L2 and L2-L3  potentially affecting the exiting L L1 or L2 nerve roots - much improved on exam today - no changes in tx plan - will discuss MRI findings with a Spine Surgeon   Acute urinary retention Required transient utilization of Foley catheter but this has now been removed  Uncontrolled DM2 with hyperglycemia and peripheral neuropathy A1c 11.8 - CBG controlled as inpatient - continue to follow  HTN Blood pressure well-controlled at present  Depression Continue his usual dose of duloxetine   Normocytic anemia No gross blood loss appreciable  Cocaine  abuse Has been counseled on need to discontinue cocaine  use  HLD Continue atorvastatin   Tobacco abuse Has been counseled on the need to discontinue tobacco abuse    Family Communication: No family present at time of exam Disposition:   Skilled Nursing-Short Term Rehab (<3 Hours/Day)03/25/2024 1348  Objective: Blood pressure (!) 142/71, pulse 100, temperature 98.4 F (36.9 C), temperature source Oral, resp. rate 16, height 5' 8 (1.727 m), weight 68 kg, SpO2 93%.  Intake/Output Summary (Last 24 hours) at 03/26/2024 1006 Last data filed at 03/26/2024 9166 Gross per 24 hour  Intake 240 ml  Output 250 ml  Net -10 ml   Filed Weights   03/15/24 1500  Weight: 68 kg    Examination: General: No acute respiratory distress Lungs: Clear to auscultation bilaterally without wheezes or crackles Cardiovascular: Regular rate and rhythm without murmur  Abdomen: Abdomen benign Extremities: No significant edema - intact sensation touch th/o L LE -able to flex  at the hip/raise the leg up while in a seated position -flexion at knee intact -extension at hip and knee intact  CBC: Recent Labs  Lab 03/22/24 0653 03/23/24 0629 03/24/24 0522  WBC 10.9* 8.5 9.6  NEUTROABS 7.2 5.3 6.4  HGB 10.7* 11.3* 11.4*  HCT 33.3* 34.9* 34.4*  MCV 94.6 94.1 93.2  PLT 667* 718* 715*   Basic Metabolic Panel: Recent Labs  Lab 03/22/24 0653 03/23/24 0629  03/24/24 0522  NA 135 136 137  K 4.5 4.9 4.8  CL 98 98 99  CO2 26 28 26   GLUCOSE 123* 220* 164*  BUN 22 23 23   CREATININE 1.03 1.08 1.09  CALCIUM  9.5 9.8 9.7  MG 1.7 1.9 1.7  PHOS 2.9 3.8 3.7   GFR: Estimated Creatinine Clearance: 65.9 mL/min (by C-G formula based on SCr of 1.09 mg/dL).   Scheduled Meds:  amLODipine   10 mg Oral Daily   aspirin  EC  81 mg Oral BID   atorvastatin   40 mg Oral Daily   bisacodyl   10 mg Oral Q1500   Chlorhexidine  Gluconate Cloth  6 each Topical Daily   DULoxetine   60 mg Oral QPM   folic acid   1 mg Oral Daily   gabapentin   300 mg Oral TID   insulin  aspart  0-15 Units Subcutaneous TID WC   insulin  aspart  0-5 Units Subcutaneous QHS   insulin  aspart  5 Units Subcutaneous TID WC   insulin  glargine-yfgn  30 Units Subcutaneous Daily   isosorbide  mononitrate  30 mg Oral Daily   lactulose   20 g Oral BID   metFORMIN   1,000 mg Oral BID WC   nicotine   21 mg Transdermal Daily   polyethylene glycol  17 g Oral Daily   pregabalin   75 mg Oral BID   senna  1 tablet Oral Daily   tamsulosin   0.4 mg Oral Daily      LOS: 13 days   Reyes IVAR Moores, MD [facility] Office  [phone] Pager - Text Page per Tracey  If 7PM-7AM, please contact night-coverage per Amion 03/26/2024, 10:06 AM     "

## 2024-03-26 NOTE — TOC Progression Note (Signed)
 Transition of Care [facility]) - Progression Note    Patient Details  Name: David Wyatt MRN: [id number] Date of Birth: 02/01/1961  Transition [facility]) CM/SW Contact  Bridget Cordella Simmonds, LCSW Phone Number: 03/26/2024, 8:41 AM  Clinical Narrative:   SNF referral resent out in hub with notification that [facility] will consider at single case agreement for any facility.      Expected Discharge Plan: Skilled Nursing Facility Barriers to Discharge: Continued Medical Work up, SNF Pending bed offer               Expected Discharge Plan and Services In-house Referral: Clinical Social Work   Post Acute Care Choice: Skilled Nursing Facility Living arrangements for the past 2 months: Homeless Shelter, Homeless ([employer] and on the street) Expected Discharge Date: 03/16/24                                     Social Drivers of Health (SDOH) Interventions SDOH Screenings   Food Insecurity: Food Insecurity Present (03/15/2024)  Housing: High Risk (03/15/2024)  Transportation Needs: Unmet Transportation Needs (03/15/2024)  Utilities: At Risk (03/15/2024)  Alcohol Screen: Low Risk (12/07/2023)  Depression (PHQ2-9): Medium Risk (12/06/2023)  Financial Resource Strain: High Risk (01/15/2024)  Social Connections: Unknown (03/15/2024)  Tobacco Use: Medium Risk (03/14/2024)    Readmission Risk Interventions     No data to display

## 2024-03-26 NOTE — Treatment Plan (Signed)
 Occupational Therapy Treatment Patient Details Name: Daemon Dowty MRN: [id number] DOB: [birth year] Today's Date: 03/26/2024   History of present illness 64 y.o. male presents 03/13/24 with L hip pain. Imaging shows displaced intertrochanteric fracture on the left. S/p IM rod L hip 1/4. PMH: DM2 with neuropathy, HTN, HLD, substance use, chronic back pain, arthritis.   OT comments  Pt very alert and agreeable to OOB on arrival. Pt unaware lunch present in  room or incontinence of urine on bed due to urinal. Pt reporting that I can't eat this chicken pt educated that its fish tilapia several times during session. Pt (A) with calling the cafeteria  to get food which pt is agreeable to eat. Recommendation for skilled inpatient follow up therapy, <3 hours/day. Remains appropriate.       If plan is discharge home, recommend the following:  Assistance with cooking/housework;Direct supervision/assist for medications management;Direct supervision/assist for financial management;Assist for transportation;A lot of help with bathing/dressing/bathroom;A lot of help with walking and/or transfers   Equipment Recommendations  BSC/3in1;Other (comment) (RW, transport chair)    Recommendations for Other Services      Precautions / Restrictions Precautions Precautions: Fall Recall of Precautions/Restrictions: Impaired Restrictions Weight Bearing Restrictions Per Provider Order: Yes LLE Weight Bearing Per Provider Order: Weight bearing as tolerated       Mobility Bed Mobility Overal bed mobility: Modified Independent                  Transfers Overall transfer level: Needs assistance Equipment used: Rolling walker (2 wheels) Transfers: Sit to/from Stand Sit to Stand: Contact guard assist           General transfer comment: cues for safety and seqeunce RW to take step and turn to chair     Balance Overall balance assessment: Needs assistance Sitting-balance support: Bilateral upper  extremity supported, Feet supported Sitting balance-Leahy Scale: Good     Standing balance support: Bilateral upper extremity supported, Reliant on assistive device for balance, During functional activity Standing balance-Leahy Scale: Poor                             ADL either performed or assessed with clinical judgement   ADL Overall ADL's : Needs assistance/impaired Eating/Feeding: Independent   Grooming: Wash/dry hands;Independent;Sitting             Upper Body Dressing Details (indicate cue type and reason): incontinence on gown pt reports gown is not wet. pt noted to have wetness on bed as well     Toilet Transfer: Minimal assistance;Rolling walker (2 wheels);BSC/3in1;Stand-pivot             General ADL Comments: pt attempting to exit the bed without help. pt needs cues for safety. pt very excited that LLE quad is active and he is able to stand    Extremity/Trunk Assessment Upper Extremity Assessment Upper Extremity Assessment: Overall WFL for tasks assessed   Lower Extremity Assessment Lower Extremity Assessment: Defer to PT evaluation LLE Deficits / Details: demonstrates hip flexion in supine but unable to complete single leg raise        Vision   Vision Assessment?: No apparent visual deficits   Perception     Praxis     Communication Communication Communication: Impaired Factors Affecting Communication: Reduced clarity of speech   Cognition Arousal: Alert Behavior During Therapy: WFL for tasks assessed/performed Cognition: No family/caregiver present to determine baseline     Awareness: Online awareness impaired  Following commands: Intact        Cueing   Cueing Techniques: Verbal cues  Exercises      Shoulder Instructions       General Comments      Pertinent Vitals/ Pain       Pain Assessment Pain Assessment: Faces Faces Pain Scale: Hurts little more Pain Location: L LE Pain  Descriptors / Indicators: Discomfort, Grimacing Pain Intervention(s): Monitored during session, Premedicated before session, Repositioned, Limited activity within patient's tolerance  Home Living                                          Prior Functioning/Environment              Frequency  Min 2X/week        Progress Toward Goals  OT Goals(current goals can now be found in the care plan section)  Progress towards OT goals: Progressing toward goals  Acute Rehab OT Goals Patient Stated Goal: to get something to eat OT Goal Formulation: With patient Time For Goal Achievement: 04/09/24 Potential to Achieve Goals: Good ADL Goals Pt Will Perform Lower Body Bathing: with contact guard assist;with adaptive equipment Pt Will Perform Lower Body Dressing: with contact guard assist;with adaptive equipment;sitting/lateral leans Pt Will Transfer to Toilet: bedside commode;stand pivot transfer;with contact guard assist Pt/caregiver will Perform Home Exercise Program: Both right and left upper extremity;With written HEP provided;Increased strength Additional ADL Goal #1: Pt will engage in bed mobility with supervision as a precursor to ADL tasks OOB.  Plan      Co-evaluation                 AM-PAC OT 6 Clicks Daily Activity     Outcome Measure   Help from another person eating meals?: None Help from another person taking care of personal grooming?: None Help from another person toileting, which includes using toliet, bedpan, or urinal?: A Little Help from another person bathing (including washing, rinsing, drying)?: A Little Help from another person to put on and taking off regular upper body clothing?: None Help from another person to put on and taking off regular lower body clothing?: A Lot 6 Click Score: 20    End of Session Equipment Utilized During Treatment: Gait belt;Rolling walker (2 wheels)  OT Visit Diagnosis: Unsteadiness on feet  (R26.81);Muscle weakness (generalized) (M62.81);History of falling (Z91.81);Pain   Activity Tolerance Patient tolerated treatment well   Patient Left in chair;with call bell/phone within reach;with chair alarm set   Nurse Communication Mobility status;Precautions        Time: 8741-8678 OT Time Calculation (min): 23 min  Charges: OT General Charges $OT Visit: 1 Visit OT Treatments $Self Care/Home Management : 23-37 mins   Brynn, OTR/L  Acute Rehabilitation Services Office: [phone] .   Ely Molt 03/26/2024, 1:26 PM

## 2024-03-27 DIAGNOSIS — S72142G Displaced intertrochanteric fracture of left femur, subsequent encounter for closed fracture with delayed healing: Secondary | ICD-10-CM | POA: Diagnosis not present

## 2024-03-27 LAB — URINALYSIS, ROUTINE W REFLEX MICROSCOPIC
Glucose, UA: NEGATIVE mg/dL
Hgb urine dipstick: NEGATIVE
Ketones, ur: NEGATIVE mg/dL
Nitrite: NEGATIVE
Protein, ur: 100 mg/dL — AB
Specific Gravity, Urine: 1.019 (ref 1.005–1.030)
pH: 8 (ref 5.0–8.0)

## 2024-03-27 LAB — GLUCOSE, CAPILLARY
Glucose-Capillary: 249 mg/dL — ABNORMAL HIGH (ref 70–99)
Glucose-Capillary: 175 mg/dL — ABNORMAL HIGH (ref 70–99)
Glucose-Capillary: 133 mg/dL — ABNORMAL HIGH (ref 70–99)
Glucose-Capillary: 153 mg/dL — ABNORMAL HIGH (ref 70–99)

## 2024-03-27 NOTE — Progress Notes (Addendum)
 Patient reporting pain with urination.  03/27/24 0910  Urine Measurement/Characteristics  Urine (mL) 300 mL  Urinary Incontinence No  Urine Color Amber  Urine Appearance Cloudy

## 2024-03-27 NOTE — Plan of Care (Signed)
" °  Problem: Education: Goal: Ability to describe self-care measures that may prevent or decrease complications (Diabetes Survival Skills Education) will improve Outcome: Progressing   Problem: Coping: Goal: Ability to adjust to condition or change in health will improve Outcome: Progressing   Problem: Fluid Volume: Goal: Ability to maintain a balanced intake and output will improve Outcome: Progressing   Problem: Health Behavior/Discharge Planning: Goal: Ability to identify and utilize available resources and services will improve Outcome: Progressing Goal: Ability to manage health-related needs will improve Outcome: Progressing   Problem: Metabolic: Goal: Ability to maintain appropriate glucose levels will improve Outcome: Progressing   Problem: Nutritional: Goal: Maintenance of adequate nutrition will improve Outcome: Progressing Goal: Progress toward achieving an optimal weight will improve Outcome: Progressing   Problem: Skin Integrity: Goal: Risk for impaired skin integrity will decrease Outcome: Progressing   Problem: Tissue Perfusion: Goal: Adequacy of tissue perfusion will improve Outcome: Progressing   Problem: Education: Goal: Knowledge of General Education information will improve Description: Including pain rating scale, medication(s)/side effects and non-pharmacologic comfort measures Outcome: Progressing   Problem: Health Behavior/Discharge Planning: Goal: Ability to manage health-related needs will improve Outcome: Progressing   Problem: Clinical Measurements: Goal: Ability to maintain clinical measurements within normal limits will improve Outcome: Progressing Goal: Will remain free from infection Outcome: Progressing Goal: Diagnostic test results will improve Outcome: Progressing Goal: Respiratory complications will improve Outcome: Progressing Goal: Cardiovascular complication will be avoided Outcome: Progressing   Problem: Activity: Goal:  Risk for activity intolerance will decrease Outcome: Progressing   Problem: Nutrition: Goal: Adequate nutrition will be maintained Outcome: Progressing   Problem: Coping: Goal: Level of anxiety will decrease Outcome: Progressing   Problem: Elimination: Goal: Will not experience complications related to bowel motility Outcome: Progressing Goal: Will not experience complications related to urinary retention Outcome: Progressing   Problem: Safety: Goal: Ability to remain free from injury will improve Outcome: Progressing   Problem: Skin Integrity: Goal: Risk for impaired skin integrity will decrease Outcome: Progressing   Problem: Pain Managment: Goal: General experience of comfort will improve and/or be controlled Outcome: Not Progressing   "

## 2024-03-27 NOTE — Progress Notes (Signed)
 "  David Wyatt  FMW:[id number] DOB: 12/02/1960 DOA: 03/13/2024 PCP: Delbert Clam, MD    Brief Narrative:  64 year old with a history of uncontrolled DM2, HTN, HLD, cocaine  abuse, diabetic neuropathy, chronic back pain, and arthritis who presented to the ER 1/3 with left hip pain following a mechanical fall.  X-rays confirmed a left hip fracture.    Goals of Care:   Code Status: Full Code   DVT prophylaxis: SCDs Start: 03/13/24 2315 ASA 81mg  BID   Interim Hx: Afebrile.  Vital signs stable.  No acute events recorded overnight.  Reports slowly improving pain.  No new complaints.  No chest pain or shortness of breath. Does report some dysuria and frequency.   Assessment & Plan:  Left hip intertrochanteric femur fracture Closed reduction and IM fixation left hip 03/14/2024 - has had persisting severe left hip pain postoperatively - follow-up x-rays revealed no dislocation or obvious complications - postop care per Ortho as follows: - up with PT/OT, encourage ambulation/mvmt as much as possible  - F/U outpt office 6 weeks PO  - Can remove staples and dressing at any time now, remove before day 14 PO  Persisting left leg pain - weakness of L quadriceps group - Hx of L4-L5 PLIF Sounds neuropathic in description - wean Neurontin  down and giving trial of Lyrica  - MRI noted no acute findings but did note mild to mod B L4 foraminal narrowing due to endplate spurring and facet degeneration, as well as severe spinal stenosis at L3-4 w/ mod to severe B L3 foraminal narrowing and L foraminal extraforaminal disc protrusions at L1-L2 and L2-L3 potentially affecting the exiting L L1 or L2 nerve roots - much improved on exam 1/16 - no changes in tx plan  Acute urinary retention Required transient utilization of Foley catheter but this has now been removed - having some dysuria today - checking UA - likely simple mechanical irritation of urethra from prior foley   Uncontrolled DM2 with hyperglycemia and  peripheral neuropathy A1c 11.8 - CBG controlled as inpatient - continue to follow  HTN Blood pressure well-controlled at present  Depression Continue his usual dose of duloxetine   Normocytic anemia No gross blood loss appreciable  Cocaine  abuse Has been counseled on need to discontinue cocaine  use  HLD Continue atorvastatin   Tobacco abuse Has been counseled on the need to discontinue tobacco abuse    Family Communication: No family present at time of exam Disposition:   Skilled Nursing-Short Term Rehab (<3 Hours/Day)03/25/2024 1348  Objective: Blood pressure 108/61, pulse 91, temperature 98.3 F (36.8 C), resp. rate 18, height 5' 8 (1.727 m), weight 68 kg, SpO2 93%.  Intake/Output Summary (Last 24 hours) at 03/27/2024 0958 Last data filed at 03/27/2024 0910 Gross per 24 hour  Intake 600 ml  Output 900 ml  Net -300 ml   Filed Weights   03/15/24 1500  Weight: 68 kg    Examination: General: No acute respiratory distress Lungs: Clear to auscultation bilaterally without wheezes or crackles Cardiovascular: Regular rate and rhythm without murmur  Abdomen: Abdomen benign Extremities: No significant edema - intact sensation touch th/o L LE -able to flex at the hip/raise the leg up while in a laying position in bed today   CBC: Recent Labs  Lab 03/22/24 0653 03/23/24 0629 03/24/24 0522  WBC 10.9* 8.5 9.6  NEUTROABS 7.2 5.3 6.4  HGB 10.7* 11.3* 11.4*  HCT 33.3* 34.9* 34.4*  MCV 94.6 94.1 93.2  PLT 667* 718* 715*   Basic Metabolic Panel:  Recent Labs  Lab 03/22/24 0653 03/23/24 0629 03/24/24 0522  NA 135 136 137  K 4.5 4.9 4.8  CL 98 98 99  CO2 26 28 26   GLUCOSE 123* 220* 164*  BUN 22 23 23   CREATININE 1.03 1.08 1.09  CALCIUM  9.5 9.8 9.7  MG 1.7 1.9 1.7  PHOS 2.9 3.8 3.7   GFR: Estimated Creatinine Clearance: 65.9 mL/min (by C-G formula based on SCr of 1.09 mg/dL).   Scheduled Meds:  amLODipine   10 mg Oral Daily   aspirin  EC  81 mg Oral BID    atorvastatin   40 mg Oral Daily   bisacodyl   10 mg Oral Q1500   DULoxetine   60 mg Oral QPM   folic acid   1 mg Oral Daily   gabapentin   300 mg Oral TID   insulin  aspart  0-15 Units Subcutaneous TID WC   insulin  aspart  0-5 Units Subcutaneous QHS   insulin  aspart  5 Units Subcutaneous TID WC   insulin  glargine-yfgn  30 Units Subcutaneous Daily   isosorbide  mononitrate  30 mg Oral Daily   ketorolac   15 mg Intravenous Q8H   lactulose   20 g Oral BID   metFORMIN   1,000 mg Oral BID WC   nicotine   21 mg Transdermal Daily   polyethylene glycol  17 g Oral Daily   pregabalin   75 mg Oral BID   senna  1 tablet Oral Daily   tamsulosin   0.4 mg Oral Daily      LOS: 14 days   Reyes IVAR Moores, MD [facility] Office  [phone] Pager - Text Page per Tracey  If 7PM-7AM, please contact night-coverage per Amion 03/27/2024, 9:58 AM     "

## 2024-03-27 NOTE — Progress Notes (Signed)
" °   03/27/24 1743  Wound 03/14/24 0921 Surgical Closed Surgical Incision Hip Left  Date First Assessed/Time First Assessed: 03/14/24 9078   Primary Wound Type: Surgical  Secondary Wound Type - Surgical: Closed Surgical Incision  Location: Hip  Location Orientation: Left  Site / Wound Assessment Dry;Red  Drainage Amount Scant  Dressing Type Foam - Lift dressing to assess site every shift  Dressing Changed Changed  Dressing Status Clean, Dry, Intact  Margins Other (Comment) (attached and unattached edges)  Treatment Off loading (staples removed from upper and lower site)    "

## 2024-03-27 NOTE — Plan of Care (Signed)
" °  Problem: Skin Integrity: Goal: Risk for impaired skin integrity will decrease Outcome: Progressing   Problem: Education: Goal: Knowledge of General Education information will improve Description: Including pain rating scale, medication(s)/side effects and non-pharmacologic comfort measures Outcome: Progressing   Problem: Clinical Measurements: Goal: Will remain free from infection Outcome: Progressing   Problem: Activity: Goal: Risk for activity intolerance will decrease Outcome: Progressing   "

## 2024-03-27 NOTE — Progress Notes (Signed)
 Urine malodorous, cloudy and amber. UA collected.

## 2024-03-27 NOTE — Progress Notes (Signed)
 MD made aware of pain with urination and appearance. New urinal given to patient. Will collect UA upon next urination.

## 2024-03-28 DIAGNOSIS — S72142G Displaced intertrochanteric fracture of left femur, subsequent encounter for closed fracture with delayed healing: Secondary | ICD-10-CM | POA: Diagnosis not present

## 2024-03-28 LAB — GLUCOSE, CAPILLARY
Glucose-Capillary: 165 mg/dL — ABNORMAL HIGH (ref 70–99)
Glucose-Capillary: 181 mg/dL — ABNORMAL HIGH (ref 70–99)
Glucose-Capillary: 78 mg/dL (ref 70–99)
Glucose-Capillary: 196 mg/dL — ABNORMAL HIGH (ref 70–99)
Glucose-Capillary: 124 mg/dL — ABNORMAL HIGH (ref 70–99)

## 2024-03-28 LAB — CBC
HCT: 29.4 % — ABNORMAL LOW (ref 39.0–52.0)
Hemoglobin: 9.4 g/dL — ABNORMAL LOW (ref 13.0–17.0)
MCH: 30.3 pg (ref 26.0–34.0)
MCHC: 32 g/dL (ref 30.0–36.0)
MCV: 94.8 fL (ref 80.0–100.0)
Platelets: 587 K/uL — ABNORMAL HIGH (ref 150–400)
RBC: 3.1 MIL/uL — ABNORMAL LOW (ref 4.22–5.81)
RDW: 13.5 % (ref 11.5–15.5)
WBC: 7.1 K/uL (ref 4.0–10.5)
nRBC: 0 % (ref 0.0–0.2)

## 2024-03-28 LAB — BASIC METABOLIC PANEL WITH GFR
Anion gap: 10 (ref 5–15)
BUN: 31 mg/dL — ABNORMAL HIGH (ref 8–23)
CO2: 27 mmol/L (ref 22–32)
Calcium: 9 mg/dL (ref 8.9–10.3)
Chloride: 101 mmol/L (ref 98–111)
Creatinine, Ser: 1.24 mg/dL (ref 0.61–1.24)
GFR, Estimated: >60 mL/min
Glucose, Bld: 165 mg/dL — ABNORMAL HIGH (ref 70–99)
Potassium: 4.5 mmol/L (ref 3.5–5.1)
Sodium: 138 mmol/L (ref 135–145)

## 2024-03-28 MED ORDER — PREGABALIN 100 MG PO CAPS
100.0000 mg | ORAL_CAPSULE | Freq: Two times a day (BID) | ORAL | Status: DC
  Administered 2024-03-28 – 2024-04-12 (×30): 100 mg via ORAL
  Filled 2024-03-28 (×30): qty 1

## 2024-03-28 NOTE — Plan of Care (Signed)

## 2024-03-28 NOTE — Progress Notes (Signed)
 "  David Wyatt  FMW:[id number] DOB: 02/24/1961 DOA: 03/13/2024 PCP: Delbert Clam, MD    Brief Narrative:  64 year old with a history of uncontrolled DM2, HTN, HLD, cocaine  abuse, diabetic neuropathy, chronic back pain, and arthritis who presented to the ER 1/3 with left hip pain following a mechanical fall.  X-rays confirmed a left hip fracture.    Goals of Care:   Code Status: Full Code   DVT prophylaxis: SCDs Start: 03/13/24 2315 ASA 81mg  BID   Interim Hx: No acute events reported overnight.  Afebrile.  Vital signs stable.  CBG controlled.  No new complaints today.  No subjective fevers or chills.  Ongoing pain of left thigh slightly improved.  Good mobility in left lower extremity persists.  Assessment & Plan:  Left hip intertrochanteric femur fracture Closed reduction and IM fixation left hip 03/14/2024 - has had persisting severe left hip pain postoperatively - follow-up x-rays revealed no dislocation or obvious complications - postop care per Ortho as follows: - up with PT/OT, encourage ambulation/mvmt as much as possible  - F/U outpt office 6 weeks PO  - Can remove staples and dressing at any time now, remove before day 14 PO  Persisting left leg pain - weakness of L quadriceps group - Hx of L4-L5 PLIF Sounds neuropathic in description - wean Neurontin  down and giving trial of Lyrica  - MRI noted no acute findings but did note mild to mod B L4 foraminal narrowing due to endplate spurring and facet degeneration, as well as severe spinal stenosis at L3-4 w/ mod to severe B L3 foraminal narrowing and L foraminal extraforaminal disc protrusions at L1-L2 and L2-L3 potentially affecting the exiting L L1 or L2 nerve roots - much improved on exam 1/16 -increase Lyrica  dose today as patient has had a good response to this medication  Acute urinary retention Required transient utilization of Foley catheter but this has now been removed - had some dysuria 1/17 with abnormal UA - likely simple  mechanical irritation of urethra from prior foley -no signs or symptoms of active infection with no fever and normal WBC -no indication for antibiotic presently  Uncontrolled DM2 with hyperglycemia and peripheral neuropathy A1c 11.8 - CBG controlled as inpatient - continue to follow  HTN Blood pressure well-controlled at present  Depression Continue his usual dose of duloxetine   Normocytic anemia No gross blood loss appreciable  Cocaine  abuse Has been counseled on need to discontinue cocaine  use  HLD Continue atorvastatin   Tobacco abuse Has been counseled on the need to discontinue tobacco abuse    Family Communication: No family present at time of exam Disposition:   Skilled Nursing-Short Term Rehab (<3 Hours/Day)03/25/2024 1348 - we are encountering difficulty obtaining a SNF bed  Objective: Blood pressure 108/72, pulse 97, temperature 98.2 F (36.8 C), temperature source Oral, resp. rate 17, height 5' 8 (1.727 m), weight 68 kg, SpO2 92%.  Intake/Output Summary (Last 24 hours) at 03/28/2024 0953 Last data filed at 03/28/2024 0819 Gross per 24 hour  Intake 480 ml  Output 1150 ml  Net -670 ml   Filed Weights   03/15/24 1500  Weight: 68 kg    Examination: General: No acute respiratory distress Lungs: Clear to auscultation bilaterally without wheezes or crackles Cardiovascular: Regular rate and rhythm without murmur  Abdomen: Abdomen benign Extremities: No significant edema  CBC: Recent Labs  Lab 03/22/24 0653 03/23/24 0629 03/24/24 0522 03/28/24 0504  WBC 10.9* 8.5 9.6 7.1  NEUTROABS 7.2 5.3 6.4  --  HGB 10.7* 11.3* 11.4* 9.4*  HCT 33.3* 34.9* 34.4* 29.4*  MCV 94.6 94.1 93.2 94.8  PLT 667* 718* 715* 587*   Basic Metabolic Panel: Recent Labs  Lab 03/22/24 0653 03/23/24 0629 03/24/24 0522 03/28/24 0504  NA 135 136 137 138  K 4.5 4.9 4.8 4.5  CL 98 98 99 101  CO2 26 28 26 27   GLUCOSE 123* 220* 164* 165*  BUN 22 23 23  31*  CREATININE 1.03 1.08  1.09 1.24  CALCIUM  9.5 9.8 9.7 9.0  MG 1.7 1.9 1.7  --   PHOS 2.9 3.8 3.7  --    GFR: Estimated Creatinine Clearance: 57.9 mL/min (by C-G formula based on SCr of 1.24 mg/dL).   Scheduled Meds:  amLODipine   10 mg Oral Daily   aspirin  EC  81 mg Oral BID   atorvastatin   40 mg Oral Daily   DULoxetine   60 mg Oral QPM   folic acid   1 mg Oral Daily   gabapentin   300 mg Oral TID   insulin  aspart  0-15 Units Subcutaneous TID WC   insulin  aspart  0-5 Units Subcutaneous QHS   insulin  aspart  5 Units Subcutaneous TID WC   insulin  glargine-yfgn  30 Units Subcutaneous Daily   isosorbide  mononitrate  30 mg Oral Daily   ketorolac   15 mg Intravenous Q8H   lactulose   20 g Oral BID   metFORMIN   1,000 mg Oral BID WC   nicotine   21 mg Transdermal Daily   polyethylene glycol  17 g Oral Daily   pregabalin   75 mg Oral BID   senna  1 tablet Oral Daily   tamsulosin   0.4 mg Oral Daily      LOS: 15 days   Reyes IVAR Moores, MD [facility] Office  [phone] Pager - Text Page per Tracey  If 7PM-7AM, please contact night-coverage per Amion 03/28/2024, 9:53 AM     "

## 2024-03-29 DIAGNOSIS — G959 Disease of spinal cord, unspecified: Secondary | ICD-10-CM | POA: Diagnosis not present

## 2024-03-29 DIAGNOSIS — S72142G Displaced intertrochanteric fracture of left femur, subsequent encounter for closed fracture with delayed healing: Secondary | ICD-10-CM | POA: Diagnosis not present

## 2024-03-29 LAB — GLUCOSE, CAPILLARY
Glucose-Capillary: 126 mg/dL — ABNORMAL HIGH (ref 70–99)
Glucose-Capillary: 231 mg/dL — ABNORMAL HIGH (ref 70–99)
Glucose-Capillary: 181 mg/dL — ABNORMAL HIGH (ref 70–99)
Glucose-Capillary: 109 mg/dL — ABNORMAL HIGH (ref 70–99)

## 2024-03-29 MED ORDER — TRAZODONE HCL 50 MG PO TABS
100.0000 mg | ORAL_TABLET | Freq: Every day | ORAL | Status: DC
  Administered 2024-03-29 – 2024-04-11 (×13): 100 mg via ORAL
  Filled 2024-03-29 (×14): qty 2

## 2024-03-29 NOTE — Progress Notes (Signed)
 Physical Therapy Treatment Patient Details Name: David Wyatt MRN: [id number] DOB: 1960/12/19 Today's Date: 03/29/2024   History of Present Illness 64 y.o. male presents 03/13/24 with L hip pain. Imaging shows displaced intertrochanteric fracture on the left. S/p IM rod L hip 1/4. PMH: DM2 with neuropathy, HTN, HLD, substance use, chronic back pain, arthritis.    PT Comments  Pt appears to demonstrate improved tolerance for ambulation compared to recent PT sessions despite pain. Pt reports he had significantly improved pain yesterday and was able to perform multiple straight leg raises in bed but he has worries today that pain has increased and states he cannot lift his leg or wiggle his toes. PT does observe movement of toes during session and pt is able to flex his hip in standing frequently. Pt will benefit from frequent mobilization in an effort to improve activity tolerance and strength. PT encourages L hip exercise at bed level to improve muscle activation. Patient will benefit from continued inpatient follow up therapy, <3 hours/day.    If plan is discharge home, recommend the following: Two people to help with walking and/or transfers;Two people to help with bathing/dressing/bathroom;Assist for transportation;Assistance with cooking/housework;Help with stairs or ramp for entrance   Can travel by private vehicle     No  Equipment Recommendations  Wheelchair (measurements PT);Wheelchair cushion (measurements PT);BSC/3in1    Recommendations for Other Services       Precautions / Restrictions Precautions Precautions: Fall Recall of Precautions/Restrictions: Impaired Restrictions Weight Bearing Restrictions Per Provider Order: Yes LLE Weight Bearing Per Provider Order: Weight bearing as tolerated     Mobility  Bed Mobility Overal bed mobility: Needs Assistance Bed Mobility: Supine to Sit, Sit to Supine     Supine to sit: Supervision Sit to supine: Contact guard assist         Transfers Overall transfer level: Needs assistance Equipment used: Rolling walker (2 wheels) Transfers: Sit to/from Stand Sit to Stand: Min assist                Ambulation/Gait Ambulation/Gait assistance: Contact guard assist Gait Distance (Feet): 25 Feet (25' x 2 trials) Assistive device: Rolling walker (2 wheels) Gait Pattern/deviations: Step-to pattern, Decreased stance time - left Gait velocity: reduced Gait velocity interpretation: <1.31 ft/sec, indicative of household ambulator   General Gait Details: pt with slowed step-to gait with reduced stance time on LLE. Pt often with increased hip/trunk flexion when standing on LLE. Pt also often stops gait cycle to repeatedly flex and extend L hip, reports he is doing so to see which movements are causing him more pain   Stairs             Wheelchair Mobility     Tilt Bed    Modified Rankin (Stroke Patients Only)       Balance Overall balance assessment: Needs assistance Sitting-balance support: No upper extremity supported, Feet supported Sitting balance-Leahy Scale: Fair     Standing balance support: Bilateral upper extremity supported, Reliant on assistive device for balance Standing balance-Leahy Scale: Poor                              Communication Communication Communication: Impaired Factors Affecting Communication: Reduced clarity of speech  Cognition Arousal: Alert Behavior During Therapy: WFL for tasks assessed/performed   PT - Cognitive impairments: Awareness  Following commands: Intact      Cueing Cueing Techniques: Verbal cues  Exercises Other Exercises Other Exercises: PT encourages performance of heel slides, hip abduction exercise to improve tolerance for AROM    General Comments General comments (skin integrity, edema, etc.): VSS on RA      Pertinent Vitals/Pain Pain Assessment Pain Assessment: Faces Faces Pain Scale: Hurts  whole lot Pain Location: L hip Pain Descriptors / Indicators: Stabbing Pain Intervention(s): Monitored during session    Home Living                          Prior Function            PT Goals (current goals can now be found in the care plan section) Acute Rehab PT Goals Patient Stated Goal: Have less pain PT Goal Formulation: With patient Time For Goal Achievement: 04/12/24 Potential to Achieve Goals: Fair Progress towards PT goals: Progressing toward goals    Frequency    Min 2X/week      PT Plan      Co-evaluation              AM-PAC PT 6 Clicks Mobility   Outcome Measure  Help needed turning from your back to your side while in a flat bed without using bedrails?: A Little Help needed moving from lying on your back to sitting on the side of a flat bed without using bedrails?: A Little Help needed moving to and from a bed to a chair (including a wheelchair)?: A Little Help needed standing up from a chair using your arms (e.g., wheelchair or bedside chair)?: A Little Help needed to walk in hospital room?: A Little Help needed climbing 3-5 steps with a railing? : Total 6 Click Score: 16    End of Session Equipment Utilized During Treatment: Gait belt Activity Tolerance: Patient tolerated treatment well Patient left: in bed;with call bell/phone within reach;with bed alarm set Nurse Communication: Mobility status PT Visit Diagnosis: Unsteadiness on feet (R26.81);Pain;Other abnormalities of gait and mobility (R26.89);Muscle weakness (generalized) (M62.81) Pain - Right/Left: Left Pain - part of body: Hip;Leg     Time: 8479-8392 PT Time Calculation (min) (ACUTE ONLY): 47 min  Charges:    $Gait Training: 8-22 mins $Therapeutic Exercise: 8-22 mins $Therapeutic Activity: 8-22 mins PT General Charges $$ ACUTE PT VISIT: 1 Visit                     Bernardino JINNY Ruth, PT, DPT Acute Rehabilitation Office [phone]    Bernardino JINNY Ruth 03/29/2024, 4:16  PM

## 2024-03-29 NOTE — Progress Notes (Signed)
 "  David Wyatt  FMW:[id number] DOB: 05-13-1960 DOA: 03/13/2024 PCP: Delbert Clam, MD    Brief Narrative:  64 year old with a history of uncontrolled DM2, HTN, HLD, cocaine  abuse, diabetic neuropathy, chronic back pain, and arthritis who presented to the ER 1/3 with left hip pain following a mechanical fall.  X-rays confirmed a left hip fracture.    Goals of Care:   Code Status: Full Code   DVT prophylaxis: SCDs Start: 03/13/24 2315 ASA 81mg  BID   Interim Hx: Afebrile. Vitals are stable. No acute events reported overnight. CBG controlled.  Reports that the neuropathic type pain in his left leg is improving with increased dose of Lyrica .  Complains of difficulty sleeping due to the lateness of dosing of sleep medication.  Assessment & Plan:  Left hip intertrochanteric femur fracture Closed reduction and IM fixation left hip 03/14/2024 - has had persisting severe left hip pain postoperatively - follow-up x-rays revealed no dislocation or obvious complications - postop care per Ortho as follows: - up with PT/OT, encourage ambulation/mvmt as much as possible  - F/U outpt office 6 weeks PO  - Can remove staples and dressing at any time now, remove before day 14 PO  Persisting left leg pain - weakness of L quadriceps group - Hx of L4-L5 PLIF Sounds neuropathic in description - wean Neurontin  down and giving trial of Lyrica  - MRI noted no acute findings but did note mild to mod B L4 foraminal narrowing due to endplate spurring and facet degeneration, as well as severe spinal stenosis at L3-4 w/ mod to severe B L3 foraminal narrowing and L foraminal extraforaminal disc protrusions at L1-L2 and L2-L3 potentially affecting the exiting L L1 or L2 nerve roots - much improved on exam 1/16 - increased Lyrica  dose 1/18 as patient has had a good response to this medication  Acute urinary retention Required transient utilization of Foley catheter but this has now been removed - had some dysuria 1/17 with  abnormal UA - likely simple mechanical irritation of urethra from prior foley - no signs or symptoms of active infection with no fever and normal WBC - no indication for antibiotic presently  Uncontrolled DM2 with hyperglycemia and peripheral neuropathy A1c 11.8 - CBG controlled as inpatient - continue to follow  HTN Blood pressure well-controlled at present  Depression Continue his usual dose of duloxetine   Normocytic anemia No gross blood loss appreciable  Cocaine  abuse Has been counseled on need to discontinue cocaine  use  HLD Continue atorvastatin   Tobacco abuse Has been counseled on the need to discontinue tobacco abuse    Family Communication: No family present at time of exam Disposition:   Skilled Nursing-Short Term Rehab (<3 Hours/Day)03/25/2024 1348 - we are encountering difficulty obtaining a SNF bed  Objective: Blood pressure (!) 141/76, pulse (!) 53, temperature (!) 97.5 F (36.4 C), temperature source Oral, resp. rate 17, height 5' 8 (1.727 m), weight 68 kg, SpO2 92%.  Intake/Output Summary (Last 24 hours) at 03/29/2024 0929 Last data filed at 03/29/2024 9167 Gross per 24 hour  Intake 1080 ml  Output 2100 ml  Net -1020 ml   Filed Weights   03/15/24 1500  Weight: 68 kg    Examination: General: No acute respiratory distress Lungs: Clear to auscultation bilaterally without wheezes or crackles Cardiovascular: Regular rate and rhythm  Abdomen: Abdomen benign Extremities: No significant edema  CBC: Recent Labs  Lab 03/23/24 0629 03/24/24 0522 03/28/24 0504  WBC 8.5 9.6 7.1  NEUTROABS 5.3 6.4  --  HGB 11.3* 11.4* 9.4*  HCT 34.9* 34.4* 29.4*  MCV 94.1 93.2 94.8  PLT 718* 715* 587*   Basic Metabolic Panel: Recent Labs  Lab 03/23/24 0629 03/24/24 0522 03/28/24 0504  NA 136 137 138  K 4.9 4.8 4.5  CL 98 99 101  CO2 28 26 27   GLUCOSE 220* 164* 165*  BUN 23 23 31*  CREATININE 1.08 1.09 1.24  CALCIUM  9.8 9.7 9.0  MG 1.9 1.7  --   PHOS 3.8  3.7  --    GFR: Estimated Creatinine Clearance: 57.9 mL/min (by C-G formula based on SCr of 1.24 mg/dL).   Scheduled Meds:  amLODipine   10 mg Oral Daily   aspirin  EC  81 mg Oral BID   atorvastatin   40 mg Oral Daily   DULoxetine   60 mg Oral QPM   folic acid   1 mg Oral Daily   gabapentin   300 mg Oral TID   insulin  aspart  0-15 Units Subcutaneous TID WC   insulin  aspart  0-5 Units Subcutaneous QHS   insulin  aspart  5 Units Subcutaneous TID WC   insulin  glargine-yfgn  30 Units Subcutaneous Daily   isosorbide  mononitrate  30 mg Oral Daily   lactulose   20 g Oral BID   metFORMIN   1,000 mg Oral BID WC   nicotine   21 mg Transdermal Daily   polyethylene glycol  17 g Oral Daily   pregabalin   100 mg Oral BID   senna  1 tablet Oral Daily   tamsulosin   0.4 mg Oral Daily      LOS: 16 days   Reyes IVAR Moores, MD [facility] Office  [phone] Pager - Text Page per Tracey  If 7PM-7AM, please contact night-coverage per Amion 03/29/2024, 9:29 AM     "

## 2024-03-29 NOTE — TOC Progression Note (Signed)
 Transition of Care [facility]) - Progression Note    Patient Details  Name: David Wyatt MRN: [id number] Date of Birth: 04/01/1960  Transition of Care [facility]) CM/SW Contact  Bridget Cordella Simmonds, LCSW Phone Number: 03/29/2024, 3:25 PM  Clinical Narrative:   [facility] showing as considering bed offer in the hub.  Message sent to [facility] and she responded she has no beds available currently.  Voicemail left later for Alisha to confirm if she could take pt once bed is available.     Expected Discharge Plan: Skilled Nursing Facility Barriers to Discharge: Continued Medical Work up, SNF Pending bed offer               Expected Discharge Plan and Services In-house Referral: Clinical Social Work   Post Acute Care Choice: Skilled Nursing Facility Living arrangements for the past 2 months: Homeless Shelter, Homeless ([employer] and on [address]) Expected Discharge Date: 03/16/24                                     Social Drivers of Health (SDOH) Interventions SDOH Screenings   Food Insecurity: Food Insecurity Present (03/29/2024)  Housing: High Risk (03/15/2024)  Transportation Needs: Unmet Transportation Needs (03/15/2024)  Utilities: At Risk (03/15/2024)  Alcohol Screen: Low Risk (12/07/2023)  Depression (PHQ2-9): Medium Risk (12/06/2023)  Financial Resource Strain: High Risk (01/15/2024)  Social Connections: Unknown (03/15/2024)  Tobacco Use: Medium Risk (03/14/2024)    Readmission Risk Interventions     No data to display

## 2024-03-29 NOTE — Plan of Care (Signed)
  Problem: Coping: Goal: Ability to adjust to condition or change in health will improve Outcome: Progressing   Problem: Health Behavior/Discharge Planning: Goal: Ability to manage health-related needs will improve Outcome: Progressing   Problem: Nutritional: Goal: Maintenance of adequate nutrition will improve Outcome: Progressing

## 2024-03-30 ENCOUNTER — Inpatient Hospital Stay (HOSPITAL_COMMUNITY): Payer: MEDICAID

## 2024-03-30 DIAGNOSIS — M7989 Other specified soft tissue disorders: Secondary | ICD-10-CM

## 2024-03-30 DIAGNOSIS — G959 Disease of spinal cord, unspecified: Secondary | ICD-10-CM | POA: Diagnosis not present

## 2024-03-30 DIAGNOSIS — S72142G Displaced intertrochanteric fracture of left femur, subsequent encounter for closed fracture with delayed healing: Secondary | ICD-10-CM | POA: Diagnosis not present

## 2024-03-30 LAB — GLUCOSE, CAPILLARY
Glucose-Capillary: 185 mg/dL — ABNORMAL HIGH (ref 70–99)
Glucose-Capillary: 121 mg/dL — ABNORMAL HIGH (ref 70–99)
Glucose-Capillary: 82 mg/dL (ref 70–99)
Glucose-Capillary: 205 mg/dL — ABNORMAL HIGH (ref 70–99)

## 2024-03-30 MED ORDER — GABAPENTIN 400 MG PO CAPS
400.0000 mg | ORAL_CAPSULE | Freq: Every day | ORAL | Status: DC
  Administered 2024-03-30 – 2024-04-11 (×13): 400 mg via ORAL
  Filled 2024-03-30 (×13): qty 1

## 2024-03-30 MED ORDER — GABAPENTIN 300 MG PO CAPS
300.0000 mg | ORAL_CAPSULE | Freq: Two times a day (BID) | ORAL | Status: DC
  Administered 2024-03-31 – 2024-04-12 (×25): 300 mg via ORAL
  Filled 2024-03-30 (×4): qty 1
  Filled 2024-03-30: qty 3
  Filled 2024-03-30 (×20): qty 1

## 2024-03-30 MED ORDER — OXYCODONE HCL 5 MG PO TABS
10.0000 mg | ORAL_TABLET | Freq: Once | ORAL | Status: AC
  Administered 2024-03-30: 10 mg via ORAL
  Filled 2024-03-30: qty 2

## 2024-03-30 NOTE — Progress Notes (Signed)
 Left lower ext venous study  has been completed. Refer to [facility] under chart review to view preliminary results.   03/30/2024  12:41 PM David Wyatt, David Wyatt

## 2024-03-30 NOTE — Plan of Care (Signed)
  Problem: Coping: Goal: Ability to adjust to condition or change in health will improve Outcome: Progressing   Problem: Fluid Volume: Goal: Ability to maintain a balanced intake and output will improve Outcome: Progressing   Problem: Metabolic: Goal: Ability to maintain appropriate glucose levels will improve Outcome: Progressing   Problem: Nutritional: Goal: Maintenance of adequate nutrition will improve Outcome: Progressing Goal: Progress toward achieving an optimal weight will improve Outcome: Progressing

## 2024-03-30 NOTE — Progress Notes (Signed)
 "  David Wyatt  FMW:[id number] DOB: 26-Sep-1960 DOA: 03/13/2024 PCP: Delbert Clam, MD    Brief Narrative:  64 year old with a history of uncontrolled DM2, HTN, HLD, cocaine  abuse, diabetic neuropathy, chronic back pain, and arthritis who presented to the ER 1/3 with left hip pain following a mechanical fall.  X-rays confirmed a left hip fracture.    Goals of Care:   Code Status: Full Code   DVT prophylaxis: SCDs Start: 03/13/24 2315 ASA 81mg  BID   Interim Hx: No acute issues reported overnight.  Appears to be making slow progress with ongoing therapy.  Afebrile.  Vital signs stable.  CBG 109-231.  Reports persistent left thigh pain today.  Reports to me that he is not able to move his left leg but then on exam is actually able to display movement of the leg.  Weakness in the leg does appear consistent with previous exams.  No chest pain or shortness of breath.  Assessment & Plan:  Left hip intertrochanteric femur fracture Closed reduction and IM fixation left hip 03/14/2024 - has had persisting severe left hip pain postoperatively - follow-up x-rays revealed no dislocation or obvious complications - postop care per Ortho as follows: - up with PT/OT, encourage ambulation/mvmt as much as possible  - F/U outpt office 6 weeks PO  - Can remove staples and dressing at any time now, remove before day 14 PO  Persisting left leg pain - weakness of L quadriceps group - Hx of L4-L5 PLIF Sounds neuropathic in description - weaned Neurontin  down and giving trial of Lyrica  - MRI noted no acute findings but did note mild to mod B L4 foraminal narrowing due to endplate spurring and facet degeneration, as well as severe spinal stenosis at L3-4 w/ mod to severe B L3 foraminal narrowing and L foraminal extraforaminal disc protrusions at L1-L2 and L2-L3 potentially affecting the exiting L L1 or L2 nerve roots - much improved on exam 1/16 - increased Lyrica  dose 1/18 as patient has had a good response to this  medication -Will check venous duplex of the left leg for completeness sake to assure he does not have an occult DVT  Acute urinary retention Required transient utilization of Foley catheter but this has now been removed - had some dysuria 1/17 with abnormal UA - likely simple mechanical irritation of urethra from prior foley - no signs or symptoms of active infection with no fever and normal WBC - no indication for antibiotic presently  Uncontrolled DM2 with hyperglycemia and peripheral neuropathy A1c 11.8 - CBG controlled as inpatient - continue to follow  HTN Blood pressure well-controlled in general   Depression Continue his usual dose of duloxetine   Normocytic anemia No gross blood loss appreciable  Cocaine  abuse Has been counseled on need to discontinue cocaine  use  HLD Continue atorvastatin   Tobacco abuse Has been counseled on the need to discontinue tobacco abuse    Family Communication: No family present at time of exam Disposition:   Skilled Nursing-Short Term Rehab (<3 Hours/Day)03/29/2024 1607 - we are encountering difficulty obtaining a SNF bed  Objective: Blood pressure (!) 162/95, pulse 100, temperature 98.4 F (36.9 C), temperature source Oral, resp. rate 16, height 5' 8 (1.727 m), weight 68 kg, SpO2 96%.  Intake/Output Summary (Last 24 hours) at 03/30/2024 0953 Last data filed at 03/30/2024 0745 Gross per 24 hour  Intake 720 ml  Output 1300 ml  Net -580 ml   Filed Weights   03/15/24 1500  Weight: 68 kg  Examination: General: No acute respiratory distress Lungs: Clear to auscultation bilaterally  Cardiovascular: Regular rate and rhythm  Abdomen: Abdomen benign Extremities: No significant edema - 5/5 strength R LE - 4+/5 strength L LE - intact sensation touch th/o B LE   CBC: Recent Labs  Lab 03/24/24 0522 03/28/24 0504  WBC 9.6 7.1  NEUTROABS 6.4  --   HGB 11.4* 9.4*  HCT 34.4* 29.4*  MCV 93.2 94.8  PLT 715* 587*   Basic Metabolic  Panel: Recent Labs  Lab 03/24/24 0522 03/28/24 0504  NA 137 138  K 4.8 4.5  CL 99 101  CO2 26 27  GLUCOSE 164* 165*  BUN 23 31*  CREATININE 1.09 1.24  CALCIUM  9.7 9.0  MG 1.7  --   PHOS 3.7  --    GFR: Estimated Creatinine Clearance: 57.9 mL/min (by C-G formula based on SCr of 1.24 mg/dL).   Scheduled Meds:  amLODipine   10 mg Oral Daily   aspirin  EC  81 mg Oral BID   atorvastatin   40 mg Oral Daily   DULoxetine   60 mg Oral QPM   folic acid   1 mg Oral Daily   gabapentin   300 mg Oral TID   insulin  aspart  0-15 Units Subcutaneous TID WC   insulin  aspart  0-5 Units Subcutaneous QHS   insulin  aspart  5 Units Subcutaneous TID WC   insulin  glargine-yfgn  30 Units Subcutaneous Daily   isosorbide  mononitrate  30 mg Oral Daily   lactulose   20 g Oral BID   metFORMIN   1,000 mg Oral BID WC   nicotine   21 mg Transdermal Daily   polyethylene glycol  17 g Oral Daily   pregabalin   100 mg Oral BID   senna  1 tablet Oral Daily   tamsulosin   0.4 mg Oral Daily   traZODone   100 mg Oral QHS      LOS: 17 days   Reyes IVAR Moores, MD [facility] Office  [phone] Pager - Text Page per Tracey  If 7PM-7AM, please contact night-coverage per Amion 03/30/2024, 9:53 AM     "

## 2024-03-31 ENCOUNTER — Ambulatory Visit (HOSPITAL_COMMUNITY): Payer: MEDICAID

## 2024-03-31 DIAGNOSIS — S72142G Displaced intertrochanteric fracture of left femur, subsequent encounter for closed fracture with delayed healing: Secondary | ICD-10-CM | POA: Diagnosis not present

## 2024-03-31 LAB — GLUCOSE, CAPILLARY
Glucose-Capillary: 170 mg/dL — ABNORMAL HIGH (ref 70–99)
Glucose-Capillary: 181 mg/dL — ABNORMAL HIGH (ref 70–99)
Glucose-Capillary: 122 mg/dL — ABNORMAL HIGH (ref 70–99)
Glucose-Capillary: 105 mg/dL — ABNORMAL HIGH (ref 70–99)

## 2024-03-31 NOTE — Progress Notes (Signed)
 Physical Therapy Treatment Patient Details Name: David Wyatt MRN: [id number] DOB: [birth year] Today's Date: 03/31/2024   History of Present Illness 64 y.o. male presents 03/13/24 with L hip pain. Imaging shows displaced intertrochanteric fracture on the left. S/p IM rod L hip 1/4. PMH: DM2 with neuropathy, HTN, HLD, substance use, chronic back pain, arthritis.    PT Comments  Arrive to room, pt sleeping but easily roused and agreeable to PT. Supervision for supine>sit w/ HOB elevated and rail use, cues for LLE management. minA +1 w RW for STS, cues for posture and weight shifting once standing. Gait trial limited d/t increased pain and pt quickly fatiguing, ~59ft total. Cues for posture and AD management, CGA initially with gait but minA with fatigue. Returned to EOB with therex performed as below, modA for return to supine primarily for LLE management given pain. Reviewed bed level therex with good tolerance. No adverse events, RN in room on departure for meds. Recommend skilled PT in acute/post acute settings to maximize functional independence and safety w/ mobility.     If plan is discharge home, recommend the following: Two people to help with walking and/or transfers;Two people to help with bathing/dressing/bathroom;Assist for transportation;Assistance with cooking/housework;Help with stairs or ramp for entrance   Can travel by private vehicle     No  Equipment Recommendations  Wheelchair (measurements PT);Wheelchair cushion (measurements PT);BSC/3in1    Recommendations for Other Services       Precautions / Restrictions Precautions Precautions: Fall Recall of Precautions/Restrictions: Intact Restrictions Weight Bearing Restrictions Per Provider Order: Yes LLE Weight Bearing Per Provider Order: Weight bearing as tolerated     Mobility  Bed Mobility Overal bed mobility: Needs Assistance Bed Mobility: Supine to Sit, Sit to Supine     Supine to sit: Supervision Sit to supine: Mod  assist   General bed mobility comments: supine>sit supervision w/ HOB elevated and bed rail use. Sit>supine mod A for LLE management d/t pain, cues for sequencing, pt utilizing bed rails. pt is able to scoot up in bed independently using BUE on bed rails and RLE pushing    Transfers Overall transfer level: Needs assistance Equipment used: Rolling walker (2 wheels) Transfers: Sit to/from Stand Sit to Stand: Min assist           General transfer comment: lowest bed surface w RW minA +1; cues for hand positioning and sequencing, initially trying to pull RW with BIL UE.    Ambulation/Gait Ambulation/Gait assistance: Min assist Gait Distance (Feet): 5 Feet Assistive device: Rolling walker (2 wheels) Gait Pattern/deviations: Step-to pattern, Decreased stance time - left       General Gait Details: significantly increased time/effort w/ gait trial, intermittently TTWB on LLE but able to improve weight shifting with cues. Consistency limited by pain/fatigue. Cues for posture and RW management. initially CGA but increasing to minA with fatigue   Stairs             Wheelchair Mobility     Tilt Bed    Modified Rankin (Stroke Patients Only)       Balance Overall balance assessment: Needs assistance Sitting-balance support: No upper extremity supported, Feet supported Sitting balance-Leahy Scale: Fair     Standing balance support: Bilateral upper extremity supported, Reliant on assistive device for balance Standing balance-Leahy Scale: Poor Standing balance comment: CGA w RW, heavy reliance on RW noted, fwd flexed posture  Communication Communication Communication: No apparent difficulties  Cognition Arousal: Alert Behavior During Therapy: WFL for tasks assessed/performed   PT - Cognitive impairments: Awareness                       PT - Cognition Comments: requires increased time for tasks, requiring redirection to  tasks at time d/t pain Following commands: Intact      Cueing Cueing Techniques: Verbal cues, Gestural cues  Exercises General Exercises - Lower Extremity Ankle Circles/Pumps: Other (comment) (seated EOB calf raises x10, education on ankle pumps while supine) Quad Sets: Supine, Other reps (comment) (practice reps with visual demonstration, tactile cueing, and practice on contralateral limb) Long Arc Quad: Other (comment) (seated EOB, LAQ x5 LLE withint tolerated ROM) Heel Slides: Other (comment) (demonstrative reps AROM LLE in supine)    General Comments General comments (skin integrity, edema, etc.): increased time/effort with mobility. Pt motivated to participate with PT but is limited by pain/fatigue once weightbearing.      Pertinent Vitals/Pain Pain Assessment Pain Assessment: 0-10 Pain Score: 10-Worst pain ever Pain Location: L hip Pain Descriptors / Indicators: Constant, Sharp Pain Intervention(s): Limited activity within patient's tolerance, Monitored during session, Patient requesting pain meds-RN notified, Repositioned    Home Living                          Prior Function            PT Goals (current goals can now be found in the care plan section) Acute Rehab PT Goals Patient Stated Goal: get stronger PT Goal Formulation: With patient Time For Goal Achievement: 04/12/24 Potential to Achieve Goals: Fair Progress towards PT goals: Progressing toward goals    Frequency    Min 2X/week      PT Plan      Co-evaluation              AM-PAC PT 6 Clicks Mobility   Outcome Measure  Help needed turning from your back to your side while in a flat bed without using bedrails?: A Little Help needed moving from lying on your back to sitting on the side of a flat bed without using bedrails?: A Lot Help needed moving to and from a bed to a chair (including a wheelchair)?: A Little Help needed standing up from a chair using your arms (e.g.,  wheelchair or bedside chair)?: A Little Help needed to walk in hospital room?: A Little Help needed climbing 3-5 steps with a railing? : Total 6 Click Score: 15    End of Session Equipment Utilized During Treatment: Gait belt Activity Tolerance: Patient tolerated treatment well;No increased pain;Patient limited by pain Patient left: in bed;with call bell/phone within reach;with bed alarm set;with nursing/sitter in room;with SCD's reapplied Nurse Communication: Mobility status PT Visit Diagnosis: Unsteadiness on feet (R26.81);Pain;Other abnormalities of gait and mobility (R26.89);Muscle weakness (generalized) (M62.81) Pain - Right/Left: Left Pain - part of body: Hip     Time: 8480-8444 PT Time Calculation (min) (ACUTE ONLY): 36 min  Charges:    $Therapeutic Exercise: 8-22 mins $Therapeutic Activity: 8-22 mins PT General Charges $$ ACUTE PT VISIT: 1 Visit                     Alm DELENA Jenny PT, DPT 03/31/2024 4:19 PM

## 2024-03-31 NOTE — Progress Notes (Signed)
 I am the patient's PCP at the homeless shelter clinic at [facility]  I saw the patient today.  I agree his pain is from the fracture and I wonder is some of this is his severe DM Neuropathy  Patient already on gabapentin  cymbalta  and lyrica .  Not sure what else could be added. Pain inhibiting his rehab  Let SNF know when a bed is found I can see in in follow up post acute care in my primary care clinic at the new [facility] across from [facility] [employer]  Belvie WrightMD Cell [phone] Medical Director [employer] [facility]

## 2024-03-31 NOTE — TOC Progression Note (Signed)
 Transition of Care [facility]) - Progression Note    Patient Details  Name: David Wyatt MRN: [id number] Date of Birth: Jun 15, 1960  Transition of Care [facility]) CM/SW Contact  Bridget Cordella Simmonds, LCSW Phone Number: 03/31/2024, 2:44 PM  Clinical Narrative:  [facility] has that they are considering bed offer for this pt.  CSW spoke with [facility], after more discussion she is not able to offer bed.  Pt is without SNF bed offers.  [facility] leadership is aware.      Expected Discharge Plan: Skilled Nursing Facility Barriers to Discharge: Continued Medical Work up, SNF Pending bed offer               Expected Discharge Plan and Services In-house Referral: Clinical Social Work   Post Acute Care Choice: Skilled Nursing Facility Living arrangements for the past 2 months: Homeless Shelter, Homeless ([employer] and on [address] street) Expected Discharge Date: 03/16/24                                     Social Drivers of Health (SDOH) Interventions SDOH Screenings   Food Insecurity: Food Insecurity Present (03/29/2024)  Housing: High Risk (03/15/2024)  Transportation Needs: Unmet Transportation Needs (03/15/2024)  Utilities: At Risk (03/15/2024)  Alcohol Screen: Low Risk (12/07/2023)  Depression (PHQ2-9): Medium Risk (12/06/2023)  Financial Resource Strain: High Risk (01/15/2024)  Social Connections: Unknown (03/15/2024)  Tobacco Use: Medium Risk (03/14/2024)    Readmission Risk Interventions     No data to display

## 2024-03-31 NOTE — Progress Notes (Signed)
 " PROGRESS NOTE  David Wyatt FMW:[id number] DOB: 1960/05/04 DOA: 03/13/2024 PCP: Delbert Clam, MD   LOS: 18 days   Brief narrative:  Patient is a 64 year old male with past medical history of diabetes mellitus, hypertension, hyperlipidemia, cocaine  abuse, diabetic neuropathy, chronic back pain, arthritis presented to hospital with fall and left hip pain.  X-ray of the hip showed left-sided hip fracture.  Orthopedics was consulted and patient underwent closed reduction and IM fixation during hospitalization.  Postoperatively he continued to have significant pain and had repeat x-rays and orthopedic evaluation done.  Patient briefly needing a Foley catheter during hospitalization for urinary retention.  Currently awaiting SNF placement but has no safe discharge disposition as he has lack of bed offers.    Assessment/Plan: Principal Problem:   Femoral fracture (HCC)  Left hip intertrochanteric femur fracture Status post closed reduction and IM fixation left hip 03/14/2024 -had significant postoperative pain so repeat x-rays were done without any complication.  Orthopedics has seen the patient and recommended ambulation, follow-up in the outpatient office in 6 weeks.    Persisting left leg pain -likely neuropathic in nature.  Patient was weaned down on Neurontin  and Lyrica  was initiated.  MRI did not show any acute findings but bilateral L4 foraminal narrowing at L4.  Improving on Lyrica .  Venous Doppler ultrasound of the lower extremities without DVT.   Acute urinary retention Resolved.  Briefly needed Foley catheter.  Uncontrolled DM2 with hyperglycemia and peripheral neuropathy Latest hemoglobin A1c 11.8 -will continue with long-acting insulin , sliding scale insulin  Accu-Cheks.  Diabetic diet.  Closely monitor.   Essential hypertension Overall blood pressure is controlled.   Depression Continue duloxetine    Normocytic anemia No recent labs   History of cocaine  abuse Reinforced  quitting   Hyperlipidemia Continue atorvastatin    Tobacco abuse Reinforced quitting.  Continue nicotine  patch.   DVT prophylaxis: SCDs Start: 03/13/24 2315   Disposition: Skilled nursing facility as per PT evaluation.  Status is: Inpatient Remains inpatient appropriate because: Need for rehabilitation, pain control    Code Status:     Code Status: Full Code  Family Communication: None at bedside  Consultants: Orthopedics  Procedures: Closed reduction and internal fixation with IM nail of left hip on 03/14/2024  Anti-infectives:  None  Anti-infectives (From admission, onward)    None        Subjective: Today, patient was seen and examined at bedside.  Patient denies any nausea vomiting fever chills or rigor but he still complains of pain on the hip and the anterior aspect with difficulty weightbearing  Objective: Vitals:   03/31/24 0225 03/31/24 0907  BP: 124/74 133/84  Pulse: 94 87  Resp: 15 16  Temp: 98.4 F (36.9 C) 98.4 F (36.9 C)  SpO2: 93% 100%    Intake/Output Summary (Last 24 hours) at 03/31/2024 1033 Last data filed at 03/31/2024 0900 Gross per 24 hour  Intake 480 ml  Output 1000 ml  Net -520 ml   Filed Weights   03/15/24 1500  Weight: 68 kg   Body mass index is 22.79 kg/m.   Physical Exam: GENERAL: Patient is alert awake and oriented. Not in obvious distress. HENT: No scleral pallor or icterus. Pupils equally reactive to light. Oral mucosa is moist NECK: is supple, no gross swelling noted. CHEST: Clear to auscultation. No crackles or wheezes.   CVS: S1 and S2 heard, no murmur. Regular rate and rhythm.  ABDOMEN: Soft, non-tender, bowel sounds are present. EXTREMITIES: No edema.  Moving extremities, tenderness over  the thigh on the left. CNS: Cranial nerves are intact. No focal motor deficits. SKIN: warm and dry without rashes.  Data Review: I have personally reviewed the following laboratory data and studies,  CBC: Recent Labs   Lab 04/15/24 0504  WBC 7.1  HGB 9.4*  HCT 29.4*  MCV 94.8  PLT 587*   Basic Metabolic Panel: Recent Labs  Lab 15-Apr-2024 0504  NA 138  K 4.5  CL 101  CO2 27  GLUCOSE 165*  BUN 31*  CREATININE 1.24  CALCIUM  9.0   Liver Function Tests: No results for input(s): AST, ALT, ALKPHOS, BILITOT, PROT, ALBUMIN in the last 168 hours. No results for input(s): LIPASE, AMYLASE in the last 168 hours. No results for input(s): AMMONIA in the last 168 hours. Cardiac Enzymes: No results for input(s): CKTOTAL, CKMB, CKMBINDEX, TROPONINI in the last 168 hours. BNP (last 3 results) No results for input(s): BNP in the last 8760 hours.  ProBNP (last 3 results) No results for input(s): PROBNP in the last 8760 hours.  CBG: Recent Labs  Lab 03/30/24 0646 03/30/24 1144 03/30/24 1630 03/30/24 2117 03/31/24 0609  GLUCAP 185* 121* 82 205* 170*   No results found for this or any previous visit (from the past 240 hours).   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 Other Indications: Diabetic neuropathy, chronic back pain. Comparison Study: No priors. Performing Technologist: Ricka Sturdivant-Jones RDMS, RVT  Examination Guidelines: A complete evaluation includes B-mode imaging, spectral Doppler, color Doppler, and power Doppler as needed of all accessible portions of each vessel. Bilateral testing is considered an integral part of a complete examination. Limited examinations for  reoccurring indications may be performed as noted. The reflux portion of the exam is performed with the patient in reverse Trendelenburg.  +-----+---------------+---------+-----------+----------+--------------+ RIGHTCompressibilityPhasicitySpontaneityPropertiesThrombus Aging +-----+---------------+---------+-----------+----------+--------------+ CFV  Full           Yes      Yes                                 +-----+---------------+---------+-----------+----------+--------------+ SFJ  Full                                                        +-----+---------------+---------+-----------+----------+--------------+   +---------+---------------+---------+-----------+----------+--------------+ LEFT     CompressibilityPhasicitySpontaneityPropertiesThrombus Aging +---------+---------------+---------+-----------+----------+--------------+ CFV      Full           Yes      Yes                                 +---------+---------------+---------+-----------+----------+--------------+ SFJ      Full                                                        +---------+---------------+---------+-----------+----------+--------------+  FV Prox  Full                                                        +---------+---------------+---------+-----------+----------+--------------+ FV Mid   Full           Yes      Yes                                 +---------+---------------+---------+-----------+----------+--------------+ FV DistalFull                                                        +---------+---------------+---------+-----------+----------+--------------+ PFV      Full                                                        +---------+---------------+---------+-----------+----------+--------------+ POP      Full           Yes      Yes                                 +---------+---------------+---------+-----------+----------+--------------+ PTV       Full                                                        +---------+---------------+---------+-----------+----------+--------------+ PERO     Full                                                        +---------+---------------+---------+-----------+----------+--------------+    Summary: RIGHT: - No evidence of common femoral vein obstruction.   LEFT: - There is no evidence of deep vein thrombosis in the lower extremity.  - No cystic structure found in the popliteal fossa.  *See table(s) above for measurements and observations. Electronically signed by Gaile New MD on 03/30/2024 at 3:16:56 PM.    Final       Vernal Alstrom, MD  [facility] 03/31/2024  If 7PM-7AM, please contact night-coverage             "

## 2024-04-01 DIAGNOSIS — S72142G Displaced intertrochanteric fracture of left femur, subsequent encounter for closed fracture with delayed healing: Secondary | ICD-10-CM | POA: Diagnosis not present

## 2024-04-01 LAB — GLUCOSE, CAPILLARY
Glucose-Capillary: 105 mg/dL — ABNORMAL HIGH (ref 70–99)
Glucose-Capillary: 119 mg/dL — ABNORMAL HIGH (ref 70–99)
Glucose-Capillary: 131 mg/dL — ABNORMAL HIGH (ref 70–99)
Glucose-Capillary: 62 mg/dL — ABNORMAL LOW (ref 70–99)
Glucose-Capillary: 96 mg/dL (ref 70–99)

## 2024-04-01 NOTE — TOC Progression Note (Signed)
 Transition of Care [facility]) - Progression Note    Patient Details  Name: David Wyatt MRN: [id number] Date of Birth: 20-Aug-1960  [facility]) CM/SW Contact  Bridget Cordella Simmonds, LCSW Phone Number: 04/01/2024, 8:57 AM  Clinical Narrative:  Pt staffed with [facility] supervisor Josefa Jes.  She is asking for disability income.  Per pt, he does have disability income of $500/month.  Josefa updated.     Expected Discharge Plan: Skilled Nursing Facility Barriers to Discharge: Continued Medical Work up, SNF Pending bed offer               Expected Discharge Plan and Services In-house Referral: Clinical Social Work   Post Acute Care Choice: Skilled Nursing Facility Living arrangements for the past 2 months: Homeless Shelter, Homeless ([employer] and on the street) Expected Discharge Date: 03/16/24                                     Social Drivers of Health (SDOH) Interventions SDOH Screenings   Food Insecurity: Food Insecurity Present (03/29/2024)  Housing: High Risk (03/15/2024)  Transportation Needs: Unmet Transportation Needs (03/15/2024)  Utilities: At Risk (03/15/2024)  Alcohol Screen: Low Risk (12/07/2023)  Depression (PHQ2-9): Medium Risk (12/06/2023)  Financial Resource Strain: High Risk (01/15/2024)  Social Connections: Unknown (03/15/2024)  Tobacco Use: Medium Risk (03/14/2024)    Readmission Risk Interventions     No data to display

## 2024-04-01 NOTE — Progress Notes (Signed)
 Mobility Specialist: Progress Note   04/01/24 1100  Mobility  Activity Ambulated with assistance  Level of Assistance Minimal assist, patient does 75% or more  Assistive Device Front wheel walker  Distance Ambulated (ft) 16 ft  LLE Weight Bearing Per Provider Order WBAT  Activity Response Tolerated well  Mobility Referral Yes  Mobility visit 1 Mobility  Mobility Specialist Start Time (ACUTE ONLY) 0954  Mobility Specialist Stop Time (ACUTE ONLY) 1016  Mobility Specialist Time Calculation (min) (ACUTE ONLY) 22 min    Pt received in bed, agreeable to mobility session. MinA for bed mobility to assist LLE off. Heavy minA for STS from EOB at lowest height. Ambulated to the window and back to EOB with minA. C/o LLE pain and fatigue. Declined sitting in the chair at this time. Returned to supine with SV. Left in bed with all needs met, call bell in reach. Bed alarm on.   Ileana Lute Mobility Specialist Please contact via SecureChat or Rehab office at [phone]

## 2024-04-01 NOTE — Progress Notes (Signed)
 Capillary blood glucose checked and noted to be 59 mg/dL. Pt asymptomatic at the time of assessment. Pt alert and oriented 3, able to swallow, with no signs or symptoms of hypoglycemia observed. Per hypoglycemia protocol, 15 g oral carbohydrates administered (orange juice). Pt tolerated intervention well. Blood glucose rechecked after 15 minutes and improved to 99 mg/dL. Erminio Stabs, NP, on call, notified of hypoglycemic episode and treatment provided. No new orders received at this time. Pt educated on signs and symptoms of hypoglycemia and the importance of timely meals and snacks. Will continue to monitor blood glucose per protocol.

## 2024-04-01 NOTE — Progress Notes (Signed)
 " PROGRESS NOTE  David Wyatt FMW:[id number] DOB: Oct 28, 1960 DOA: 03/13/2024 PCP: Delbert Clam, MD   LOS: 19 days   Brief narrative:  Patient is a 64 year old male with past medical history of diabetes mellitus, hypertension, hyperlipidemia, cocaine  abuse, diabetic neuropathy, chronic back pain, arthritis presented to hospital with fall and left hip pain.  X-ray of the hip showed left-sided hip fracture.  Orthopedics was consulted and patient underwent closed reduction and IM fixation during hospitalization.  Postoperatively he continued to have significant pain and had repeat x-rays and orthopedic evaluation done.  Patient briefly needing a Foley catheter during hospitalization for urinary retention.  Currently awaiting SNF placement   Assessment/Plan: Principal Problem:   Femoral fracture (HCC)  Left hip intertrochanteric femur fracture Status post closed reduction and IM fixation left hip 03/14/2024 -had significant postoperative pain so repeat x-rays were done without any complication.  Orthopedics  recommended ambulation, follow-up in the outpatient office in 6 weeks.  Continues to have some pain.   Persisting left leg pain -likely neuropathic in nature.  Currently on duloxetine  and Neurontin .  Lyrica  has been added to 100 mg twice a day and dose of Neurontin  was decreased.[name]  MRI did not show any acute findings but bilateral L4 foraminal narrowing at L4.  Venous Doppler ultrasound of the lower extremities without DVT.   Acute urinary retention Resolved.  Briefly needed Foley catheter.  Uncontrolled DM2 with hyperglycemia and peripheral neuropathy Latest hemoglobin A1c 11.8 -will continue with long-acting insulin , sliding scale insulin  Accu-Cheks.  Diabetic diet.  Closely monitor.   Essential hypertension Overall blood pressure is controlled.   Depression Continue duloxetine    Normocytic anemia No recent labs   History of cocaine  abuse Reinforced quitting    Hyperlipidemia Continue atorvastatin    Tobacco abuse Reinforced quitting.  Continue nicotine  patch.   DVT prophylaxis: SCDs Start: 03/13/24 2315   Disposition: Skilled nursing facility as per PT evaluation.  Status is: Inpatient Remains inpatient appropriate because: Need for rehabilitation, pain control    Code Status:     Code Status: Full Code  Family Communication: None at bedside  Consultants: Orthopedics  Procedures: Closed reduction and internal fixation with IM nail of left hip on 03/14/2024  Anti-infectives:  None  Anti-infectives (From admission, onward)    None        Subjective: Today, patient was seen and examined at bedside.  Patient continues to complain of left hip pain.  Denies any nausea vomiting fever chills or rigor.  Objective: Vitals:   03/31/24 0907 03/31/24 2024  BP: 133/84 (!) 113/90  Pulse: 87 97  Resp: 16 15  Temp: 98.4 F (36.9 C) 98.2 F (36.8 C)  SpO2: 100% 95%    Intake/Output Summary (Last 24 hours) at 04/01/2024 1118 Last data filed at 04/01/2024 1105 Gross per 24 hour  Intake 240 ml  Output 750 ml  Net -510 ml   Filed Weights   03/15/24 1500  Weight: 68 kg   Body mass index is 22.79 kg/m.   Physical Exam:  GENERAL: Patient is alert awake and oriented. Not in obvious distress. HENT: No scleral pallor or icterus. Pupils equally reactive to light. Oral mucosa is moist NECK: is supple, no gross swelling noted. CHEST: Clear to auscultation. No crackles or wheezes.   CVS: S1 and S2 heard, no murmur. Regular rate and rhythm.  ABDOMEN: Soft, non-tender, bowel sounds are present. EXTREMITIES: No edema.  Left hip with dressing.  Tenderness over the left hip on palpation especially on the  medial aspect. CNS: Cranial nerves are intact. No focal motor deficits. SKIN: warm and dry without rashes.  Data Review: I have personally reviewed the following laboratory data and studies,  CBC: Recent Labs  Lab 03/28/24 0504   WBC 7.1  HGB 9.4*  HCT 29.4*  MCV 94.8  PLT 587*   Basic Metabolic Panel: Recent Labs  Lab 03/28/24 0504  NA 138  K 4.5  CL 101  CO2 27  GLUCOSE 165*  BUN 31*  CREATININE 1.24  CALCIUM  9.0   Liver Function Tests: No results for input(s): AST, ALT, ALKPHOS, BILITOT, PROT, ALBUMIN in the last 168 hours. No results for input(s): LIPASE, AMYLASE in the last 168 hours. No results for input(s): AMMONIA in the last 168 hours. Cardiac Enzymes: No results for input(s): CKTOTAL, CKMB, CKMBINDEX, TROPONINI in the last 168 hours. BNP (last 3 results) No results for input(s): BNP in the last 8760 hours.  ProBNP (last 3 results) No results for input(s): PROBNP in the last 8760 hours.  CBG: Recent Labs  Lab 03/31/24 0609 03/31/24 1136 03/31/24 1636 03/31/24 2136 04/01/24 0607  GLUCAP 170* 181* 122* 105* 105*   No results found for this or any previous visit (from the past 240 hours).   Studies: VAS US  LOWER EXTREMITY VENOUS (DVT) Result Date: 03/30/2024  Lower Venous DVT Study Patient Name:  David Wyatt  Date of Exam:   03/30/2024 Medical Rec #: [id number]      Accession #:    [id number] Date of Birth: 30-Nov-1960       Patient Gender: M Patient Age:   45 years Exam Location:  [facility] Procedure:      VAS US  LOWER EXTREMITY VENOUS (DVT) Referring Phys: JEFFREY MCCLUNG --------------------------------------------------------------------------------  Indications: Pain, and Status post left hip replacement 03/14/24. Other Indications: Diabetic neuropathy, chronic back pain. Comparison Study: No priors. Performing Technologist: Ricka Sturdivant-Jones RDMS, RVT  Examination Guidelines: A complete evaluation includes B-mode imaging, spectral Doppler, color Doppler, and power Doppler as needed of all accessible portions of each vessel. Bilateral testing is considered an integral part of a complete examination. Limited examinations for reoccurring  indications may be performed as noted. The reflux portion of the exam is performed with the patient in reverse Trendelenburg.  +-----+---------------+---------+-----------+----------+--------------+ RIGHTCompressibilityPhasicitySpontaneityPropertiesThrombus Aging +-----+---------------+---------+-----------+----------+--------------+ CFV  Full           Yes      Yes                                 +-----+---------------+---------+-----------+----------+--------------+ SFJ  Full                                                        +-----+---------------+---------+-----------+----------+--------------+   +---------+---------------+---------+-----------+----------+--------------+ LEFT     CompressibilityPhasicitySpontaneityPropertiesThrombus Aging +---------+---------------+---------+-----------+----------+--------------+ CFV      Full           Yes      Yes                                 +---------+---------------+---------+-----------+----------+--------------+ SFJ      Full                                                        +---------+---------------+---------+-----------+----------+--------------+  FV Prox  Full                                                        +---------+---------------+---------+-----------+----------+--------------+ FV Mid   Full           Yes      Yes                                 +---------+---------------+---------+-----------+----------+--------------+ FV DistalFull                                                        +---------+---------------+---------+-----------+----------+--------------+ PFV      Full                                                        +---------+---------------+---------+-----------+----------+--------------+ POP      Full           Yes      Yes                                 +---------+---------------+---------+-----------+----------+--------------+ PTV      Full                                                         +---------+---------------+---------+-----------+----------+--------------+ PERO     Full                                                        +---------+---------------+---------+-----------+----------+--------------+    Summary: RIGHT: - No evidence of common femoral vein obstruction.   LEFT: - There is no evidence of deep vein thrombosis in the lower extremity.  - No cystic structure found in the popliteal fossa.  *See table(s) above for measurements and observations. Electronically signed by Gaile New MD on 03/30/2024 at 3:16:56 PM.    Final       Vernal Alstrom, MD  [facility] 04/01/2024  If 7PM-7AM, please contact night-coverage             "

## 2024-04-01 NOTE — Progress Notes (Signed)
 Occupational Therapy Treatment Patient Details Name: David Wyatt MRN: [id number] DOB: 13-May-1960 Today's Date: 04/01/2024   History of present illness 64 y.o. male presents 03/13/24 with L hip pain. Imaging shows displaced intertrochanteric fracture on the left. S/p IM rod L hip 1/4. PMH: DM2 with neuropathy, HTN, HLD, substance use, chronic back pain, arthritis.   OT comments  Pt cooperative this session, able to complete transfers with min A from elevated bed and toilet with use of rails/grab bars. Fatigues quickly and continues to demonstrate decreased ROM to access LB for ADL. Pt able to pull into standing but dependent on BUE support requiring max A for rear peri care. Pt with increased assist and decreased activity tolerance for standing ADL like grooming. Post-acute rehab of <3 hours continues to be essential to maximize safety and independence in ADL and functional transfers.       If plan is discharge home, recommend the following:  Assistance with cooking/housework;Direct supervision/assist for medications management;Direct supervision/assist for financial management;Assist for transportation;A lot of help with bathing/dressing/bathroom;A lot of help with walking and/or transfers   Equipment Recommendations  BSC/3in1;Other (comment) (RW, transport chair)    Recommendations for Other Services      Precautions / Restrictions Precautions Precautions: Fall Recall of Precautions/Restrictions: Intact Restrictions Weight Bearing Restrictions Per Provider Order: Yes LLE Weight Bearing Per Provider Order: Weight bearing as tolerated       Mobility Bed Mobility Overal bed mobility: Needs Assistance Bed Mobility: Supine to Sit, Sit to Supine     Supine to sit: Supervision Sit to supine: Mod assist (for LE)   General bed mobility comments: supine>sit supervision w/ HOB elevated and bed rail use.    Transfers Overall transfer level: Needs assistance Equipment used: Rolling  walker (2 wheels) Transfers: Sit to/from Stand Sit to Stand: Min assist           General transfer comment: vc for safe hand placement, min A for boost and steadying assist from slightly elevated bed     Balance Overall balance assessment: Needs assistance Sitting-balance support: No upper extremity supported, Feet supported Sitting balance-Leahy Scale: Fair   Postural control: Right lateral lean Standing balance support: Bilateral upper extremity supported, Reliant on assistive device for balance Standing balance-Leahy Scale: Poor Standing balance comment: CGA w RW, heavy reliance on RW noted, fwd flexed posture                           ADL either performed or assessed with clinical judgement   ADL Overall ADL's : Needs assistance/impaired     Grooming: Wash/dry hands;Wash/dry face;Contact guard assist;Standing Grooming Details (indicate cue type and reason): limited ability to tolerate standing for grooming task             Lower Body Dressing: Maximal assistance;Sitting/lateral leans;Sit to/from stand   Toilet Transfer: Minimal assistance;Ambulation;Comfort height toilet;Grab bars Toilet Transfer Details (indicate cue type and reason): heavy use of grab bars and cues for safe descent Toileting- Clothing Manipulation and Hygiene: Minimal assistance;Sit to/from stand;Maximal assistance Toileting - Clothing Manipulation Details (indicate cue type and reason): Pt able to pull into standing with min A, then max A for rear peri care     Functional mobility during ADLs: Minimal assistance;Rolling walker (2 wheels);Cueing for sequencing;Cueing for safety General ADL Comments: limited activity tolerance    Extremity/Trunk Assessment Upper Extremity Assessment Upper Extremity Assessment: Overall WFL for tasks assessed  Vision   Vision Assessment?: No apparent visual deficits   Perception     Praxis     Communication  Communication Communication: No apparent difficulties Factors Affecting Communication: Reduced clarity of speech   Cognition Arousal: Alert Behavior During Therapy: WFL for tasks assessed/performed Cognition: No family/caregiver present to determine baseline             OT - Cognition Comments: Pt limited by fatigue but pleasant and cooperative with therapy staff this session                 Following commands: Intact        Cueing   Cueing Techniques: Verbal cues, Gestural cues  Exercises      Shoulder Instructions       General Comments      Pertinent Vitals/ Pain       Pain Assessment Pain Assessment: 0-10 Pain Score: 8  Pain Location: L hip Pain Descriptors / Indicators: Constant, Sharp Pain Intervention(s): Limited activity within patient's tolerance, Monitored during session, Repositioned  Home Living                                          Prior Functioning/Environment              Frequency  Min 2X/week        Progress Toward Goals  OT Goals(current goals can now be found in the care plan section)  Progress towards OT goals: Progressing toward goals  Acute Rehab OT Goals Patient Stated Goal: get back to bed OT Goal Formulation: With patient Time For Goal Achievement: 04/09/24 Potential to Achieve Goals: Good  Plan      Co-evaluation                 AM-PAC OT 6 Clicks Daily Activity     Outcome Measure   Help from another person eating meals?: None Help from another person taking care of personal grooming?: A Little Help from another person toileting, which includes using toliet, bedpan, or urinal?: A Little Help from another person bathing (including washing, rinsing, drying)?: A Little Help from another person to put on and taking off regular upper body clothing?: None Help from another person to put on and taking off regular lower body clothing?: A Lot 6 Click Score: 19    End of Session  Equipment Utilized During Treatment: Gait belt;Rolling walker (2 wheels)  OT Visit Diagnosis: Unsteadiness on feet (R26.81);Muscle weakness (generalized) (M62.81);History of falling (Z91.81);Pain Pain - Right/Left: Left Pain - part of body: Hip   Activity Tolerance Patient tolerated treatment well   Patient Left with call bell/phone within reach;in bed;with bed alarm set   Nurse Communication Mobility status;Precautions        Time: 0940-1003 OT Time Calculation (min): 23 min  Charges: OT General Charges $OT Visit: 1 Visit OT Treatments $Self Care/Home Management : 23-37 mins  Leita DEL OTR/L Acute Rehabilitation Services Office: [phone]   Leita PARAS [facility] 04/01/2024, 2:14 PM

## 2024-04-01 NOTE — Plan of Care (Signed)
" °  Problem: Coping: Goal: Ability to adjust to condition or change in health will improve Outcome: Progressing   Problem: Fluid Volume: Goal: Ability to maintain a balanced intake and output will improve Outcome: Progressing   Problem: Health Behavior/Discharge Planning: Goal: Ability to identify and utilize available resources and services will improve Outcome: Progressing Goal: Ability to manage health-related needs will improve Outcome: Progressing   Problem: Metabolic: Goal: Ability to maintain appropriate glucose levels will improve Outcome: Progressing   Problem: Skin Integrity: Goal: Risk for impaired skin integrity will decrease Outcome: Progressing   Problem: Tissue Perfusion: Goal: Adequacy of tissue perfusion will improve Outcome: Progressing   Problem: Education: Goal: Knowledge of General Education information will improve Description: Including pain rating scale, medication(s)/side effects and non-pharmacologic comfort measures Outcome: Progressing   Problem: Health Behavior/Discharge Planning: Goal: Ability to manage health-related needs will improve Outcome: Progressing   Problem: Clinical Measurements: Goal: Ability to maintain clinical measurements within normal limits will improve Outcome: Progressing Goal: Will remain free from infection Outcome: Progressing Goal: Diagnostic test results will improve Outcome: Progressing Goal: Respiratory complications will improve Outcome: Progressing Goal: Cardiovascular complication will be avoided Outcome: Progressing   Problem: Activity: Goal: Risk for activity intolerance will decrease Outcome: Progressing   Problem: Nutrition: Goal: Adequate nutrition will be maintained Outcome: Progressing   Problem: Coping: Goal: Level of anxiety will decrease Outcome: Progressing   Problem: Elimination: Goal: Will not experience complications related to bowel motility Outcome: Progressing Goal: Will not  experience complications related to urinary retention Outcome: Progressing   Problem: Pain Managment: Goal: General experience of comfort will improve and/or be controlled Outcome: Progressing   Problem: Safety: Goal: Ability to remain free from injury will improve Outcome: Progressing   Problem: Skin Integrity: Goal: Risk for impaired skin integrity will decrease Outcome: Progressing   "

## 2024-04-02 DIAGNOSIS — S72142G Displaced intertrochanteric fracture of left femur, subsequent encounter for closed fracture with delayed healing: Secondary | ICD-10-CM | POA: Diagnosis not present

## 2024-04-02 LAB — GLUCOSE, CAPILLARY
Glucose-Capillary: 175 mg/dL — ABNORMAL HIGH (ref 70–99)
Glucose-Capillary: 137 mg/dL — ABNORMAL HIGH (ref 70–99)
Glucose-Capillary: 235 mg/dL — ABNORMAL HIGH (ref 70–99)
Glucose-Capillary: 50 mg/dL — ABNORMAL LOW (ref 70–99)
Glucose-Capillary: 52 mg/dL — ABNORMAL LOW (ref 70–99)
Glucose-Capillary: 56 mg/dL — ABNORMAL LOW (ref 70–99)
Glucose-Capillary: 134 mg/dL — ABNORMAL HIGH (ref 70–99)

## 2024-04-02 MED ORDER — BISACODYL 5 MG PO TBEC
10.0000 mg | DELAYED_RELEASE_TABLET | Freq: Once | ORAL | Status: AC
  Administered 2024-04-02: 10 mg via ORAL
  Filled 2024-04-02: qty 2

## 2024-04-02 MED ORDER — INSULIN ASPART 100 UNIT/ML IJ SOLN
4.0000 [IU] | Freq: Three times a day (TID) | INTRAMUSCULAR | Status: DC
  Administered 2024-04-02 – 2024-04-05 (×6): 4 [IU] via SUBCUTANEOUS
  Filled 2024-04-02 (×5): qty 4

## 2024-04-02 MED ORDER — BISACODYL 10 MG RE SUPP
10.0000 mg | Freq: Once | RECTAL | Status: AC
  Administered 2024-04-02: 10 mg via RECTAL
  Filled 2024-04-02: qty 1

## 2024-04-02 NOTE — Inpatient Diabetes Management (Signed)
 Inpatient Diabetes Program Recommendations  AACE/ADA: New Consensus Statement on Inpatient Glycemic Control (2015)  Target Ranges:  Prepandial:   less than 140 mg/dL      Peak postprandial:   less than 180 mg/dL (1-2 hours)      Critically ill patients:  140 - 180 mg/dL   Lab Results  Component Value Date   GLUCAP 137 (H) 04/02/2024   HGBA1C 11.8 (H) 03/08/2024    Review of Glycemic Control  Latest Reference Range & Units 04/01/24 21:56 04/01/24 22:36 04/02/24 01:15 04/02/24 05:18  Glucose-Capillary 70 - 99 mg/dL 62 (L) 96 824 (H) 862 (H)   Diabetes history: DM 2 Outpatient Diabetes medications:  Novolog  2-12 units tid with meals Lantus  15 units daily Metformin  1000 mg bid Current orders for Inpatient glycemic control:  Novolog  0-15 units tid with meals and HS Semglee  30 units daily Novolog  5 units tid with meals  Inpatient Diabetes Program Recommendations:    Note mild low last PM.  Consider reducing Novolog  meal coverage to 4 units tid with meals (hold if patient eats less than 50% or NPO).   Thanks,  Randall Bullocks, RN, BC-ADM Inpatient Diabetes Coordinator Pager [phone]  (8a-5p)

## 2024-04-02 NOTE — Progress Notes (Signed)
 PT Cancellation Note  Patient Details Name: Yaman Grauberger MRN: [id number] DOB: [birth year]   Cancelled Treatment:    Reason Eval/Treat Not Completed: (P) Pain limiting ability to participate (pt requesting premedication for pain, RN notified. PTA may not be able to return until closer to 3pm, depending on schedule.) Will continue efforts per PT plan of care as schedule permits.    Connell HERO Eastyn Skalla 04/02/2024, 12:18 PM

## 2024-04-02 NOTE — Progress Notes (Signed)
 Physical Therapy Treatment Patient Details Name: David Wyatt MRN: [id number] DOB: 1960/03/29 Today's Date: 04/02/2024   History of Present Illness 64 y.o. male presents 03/13/24 with L hip pain. Imaging shows displaced intertrochanteric fracture on the left. S/p IM rod L hip 1/4. PMH: DM2 with neuropathy, HTN, HLD, substance use, chronic back pain, arthritis.    PT Comments  Pt received in supine, agreeable to therapy session with encouragement. Pt limited due to pain but also with near-buckling while attempting short distance gait trials x2 in room, with seated break on BSC between trials. RN notified PTA recommending Stedy and +2 for return transfers. Pt c/o constipation related discomfort but is passing gas during session, reinforced importance of OOB to chair and standing activity as tolerated to keep bowels moving, as well as drinking water . Patient will benefit from continued inpatient follow up therapy, <3 hours/day.    If plan is discharge home, recommend the following: Two people to help with walking and/or transfers;Two people to help with bathing/dressing/bathroom;Assist for transportation;Assistance with cooking/housework;Help with stairs or ramp for entrance   Can travel by private vehicle     No  Equipment Recommendations  Wheelchair (measurements PT);Wheelchair cushion (measurements PT);BSC/3in1    Recommendations for Other Services       Precautions / Restrictions Precautions Precautions: Fall Recall of Precautions/Restrictions: Intact Precaution/Restrictions Comments: tends to buckle unexpectedly Restrictions Weight Bearing Restrictions Per Provider Order: Yes LLE Weight Bearing Per Provider Order: Weight bearing as tolerated     Mobility  Bed Mobility Overal bed mobility: Needs Assistance Bed Mobility: Supine to Sit     Supine to sit: Supervision, HOB elevated, Used rails     General bed mobility comments: mod cues to initiate; increased time to perform     Transfers Overall transfer level: Needs assistance Equipment used: Rolling walker (2 wheels) Transfers: Sit to/from Stand Sit to Stand: Min assist, From elevated surface           General transfer comment: from elevated bed/BSC surfaces; increased time to transfer hands from bed/BSC to RW.    Ambulation/Gait Ambulation/Gait assistance: Mod assist Gait Distance (Feet): 8 Feet (65ft, seated break, 73ft) Assistive device: Rolling walker (2 wheels) Gait Pattern/deviations: Step-to pattern, Decreased stance time - left, Trunk flexed, Shuffle, Knee flexed in stance - left, Knee flexed in stance - right, Decreased weight shift to left, Decreased step length - right, Decreased dorsiflexion - left Gait velocity: reduced     General Gait Details: Increased time/effort, initially minA for forward steps, but near-buckling on multiple occasions with max cues for tricep elbow activation, forward gaze and step sequencing. Esp needs assist to manage RW while turning and pt does not remember to reach back for arm rest with transfers unless given multiple cues. Perseveration on pain throughout.   Stairs             Wheelchair Mobility     Tilt Bed    Modified Rankin (Stroke Patients Only)       Balance Overall balance assessment: Needs assistance Sitting-balance support: No upper extremity supported, Feet supported Sitting balance-Leahy Scale: Fair   Postural control: Right lateral lean Standing balance support: Bilateral upper extremity supported, Reliant on assistive device for balance Standing balance-Leahy Scale: Poor Standing balance comment: min to modA                            Communication Communication Communication: No apparent difficulties Factors Affecting Communication: Reduced clarity of  speech  Cognition Arousal: Alert Behavior During Therapy: WFL for tasks assessed/performed   PT - Cognitive impairments: Attention, Awareness,  Safety/Judgement                       PT - Cognition Comments: requires increased time for tasks, requiring redirection to tasks at time d/t pain, mod cues for posture/RW management due to near-buckling Following commands: Intact      Cueing Cueing Techniques: Verbal cues, Gestural cues, Tactile cues  Exercises General Exercises - Lower Extremity Ankle Circles/Pumps: AROM, Both, 15 reps (recliner) Long Arc Quad: AROM, Left, Right, 10 reps, Seated (AROM on LLE no resistance; graded resistance on RLE wtihin tolerance for more challenge and hamstring resistance pulling leg back to floor as well x10) Hip Flexion/Marching: AROM, Right, Left, 10 reps, Seated (gentle resistance for RLE within tolerance for more challenge; AROM on LLE only, no resistance)    General Comments General comments (skin integrity, edema, etc.): new BSC brought to room with deeper toilet seat per patient request to better fit his anatomy.      Pertinent Vitals/Pain Pain Assessment Pain Assessment: Faces Faces Pain Scale: Hurts even more Pain Location: L hip Pain Descriptors / Indicators: Constant, Sharp, Grimacing Pain Intervention(s): Limited activity within patient's tolerance, Monitored during session, Premedicated before session, Repositioned, RN gave pain meds during session    Home Living                          Prior Function            PT Goals (current goals can now be found in the care plan section) Acute Rehab PT Goals Patient Stated Goal: get stronger PT Goal Formulation: With patient Time For Goal Achievement: 04/12/24 Progress towards PT goals: Progressing toward goals    Frequency    Min 2X/week      PT Plan      Co-evaluation              AM-PAC PT 6 Clicks Mobility   Outcome Measure  Help needed turning from your back to your side while in a flat bed without using bedrails?: A Little Help needed moving from lying on your back to sitting on the  side of a flat bed without using bedrails?: A Lot Help needed moving to and from a bed to a chair (including a wheelchair)?: A Lot Help needed standing up from a chair using your arms (e.g., wheelchair or bedside chair)?: A Lot Help needed to walk in hospital room?: Total (<70ft) Help needed climbing 3-5 steps with a railing? : Total 6 Click Score: 11    End of Session Equipment Utilized During Treatment: Gait belt Activity Tolerance: Patient limited by pain Patient left: in chair;with call bell/phone within reach;with chair alarm set;Other (comment) (reclined) Nurse Communication: Mobility status;Need for lift equipment;Patient requests pain meds;Other (comment) (concern for buckling, recommend +2 and Stedy for return to bed or to [facility]) PT Visit Diagnosis: Unsteadiness on feet (R26.81);Pain;Other abnormalities of gait and mobility (R26.89);Muscle weakness (generalized) (M62.81) Pain - Right/Left: Left Pain - part of body: Hip     Time: 8261-8197 PT Time Calculation (min) (ACUTE ONLY): 24 min  Charges:    $Gait Training: 8-22 mins $Therapeutic Exercise: 8-22 mins PT General Charges $$ ACUTE PT VISIT: 1 Visit                     Whitt Auletta P., PTA  Acute Rehabilitation Services Secure Chat Preferred 9a-5:30pm Office: [phone]    Connell CHRISTELLA Blue 04/02/2024, 6:41 PM

## 2024-04-02 NOTE — Progress Notes (Signed)
 " PROGRESS NOTE  David Wyatt FMW:[id number] DOB: [birth year] DOA: 03/13/2024 PCP: Delbert Clam, MD   LOS: 20 days   Brief narrative:  Patient is a 64 year old male with past medical history of diabetes mellitus, hypertension, hyperlipidemia, cocaine  abuse, diabetic neuropathy, chronic back pain, arthritis presented to hospital with fall and left hip pain.  X-ray of the hip showed left-sided hip fracture.  Orthopedics was consulted and patient underwent closed reduction and IM fixation during hospitalization.  Postoperatively, he continued to have significant pain and had repeat x-rays and orthopedic evaluation done.  Patient briefly needing a Foley catheter during hospitalization for urinary retention.  Currently awaiting SNF placement   Assessment/Plan: Principal Problem:   Femoral fracture (HCC)  Left hip intertrochanteric femur fracture Status post closed reduction and IM fixation left hip 03/14/2024 -had significant postoperative pain so repeat x-rays were done without any complication.  Orthopedics  recommended ambulation, follow-up in the outpatient office in 6 weeks.  Continues to have some pain.    Persisting left leg pain -likely neuropathic in nature.  Currently on duloxetine  and Neurontin .  Lyrica  has been added to 100 mg twice a day and dose of Neurontin  was decreased.[name]  MRI did not show any acute findings but bilateral L4 foraminal narrowing at L4.  Venous Doppler ultrasound of the lower extremities without DVT.  Patient is on muscle relaxant as well.  Will add heat pack.  Uncertain why patient continues to have severe pain.   Acute urinary retention Resolved.  Briefly needed Foley catheter.  Uncontrolled DM2 with hyperglycemia and peripheral neuropathy Latest hemoglobin A1c 11.8 -will continue with long-acting insulin , sliding scale insulin  Accu-Cheks.  Diabetic diet.  Closely monitor.   Essential hypertension Overall blood pressure is controlled.   Depression Continue  duloxetine    Normocytic anemia No recent labs   History of cocaine  abuse Reinforced quitting   Hyperlipidemia Continue atorvastatin    Tobacco abuse Reinforced quitting.  Continue nicotine  patch.   Constipation.  will try Dulcolax suppository today.  Continue bowel regimen with lactulose , MiraLAX , Senokot.  Patient states that he has to strain and had very hard stools.  He also had some trouble urinating.  DVT prophylaxis: SCDs Start: 03/13/24 2315   Disposition: Skilled nursing facility as per PT evaluation.  Status is: Inpatient Remains inpatient appropriate because: Need for rehabilitation, pain control    Code Status:     Code Status: Full Code  Family Communication: None at bedside  Consultants: Orthopedics  Procedures: Closed reduction and internal fixation with IM nail of left hip on 03/14/2024  Anti-infectives:  None  Anti-infectives (From admission, onward)    None        Subjective: Today, patient was seen and examined at bedside.  Patient continues to complain of left hip pain and has been complaining of extreme constipation today.  Denies any nausea vomiting fever chills or rigor.  Denies any shortness of breath dyspnea or chest pain.  Objective: Vitals:   04/02/24 0502 04/02/24 0721  BP: 123/76 (!) 152/87  Pulse: 82 87  Resp: 16 18  Temp: 98.4 F (36.9 C) 98.8 F (37.1 C)  SpO2: 93% 95%    Intake/Output Summary (Last 24 hours) at 04/02/2024 0957 Last data filed at 04/01/2024 2300 Gross per 24 hour  Intake --  Output 750 ml  Net -750 ml   Filed Weights   03/15/24 1500  Weight: 68 kg   Body mass index is 22.79 kg/m.   Physical Exam:  General:  Average built, not  in obvious distress, alert awake and Communicative. HENT:   No scleral pallor or icterus noted. Oral mucosa is moist.  Chest:  Clear breath sounds. No crackles or wheezes.  CVS: S1 &S2 heard. No murmur.  Regular rate and rhythm. Abdomen: Soft, nontender, nondistended.   Bowel sounds are heard.   Extremities: Leg stable with surgical dressing and edema and tenderness over the medial proximal aspect. Psych: Alert, awake and oriented, normal mood CNS:  No cranial nerve deficits.  Power equal in all extremities.   Skin: Warm and dry.  No rashes noted.  Data Review: I have personally reviewed the following laboratory data and studies,  CBC: Recent Labs  Lab 03/28/24 0504  WBC 7.1  HGB 9.4*  HCT 29.4*  MCV 94.8  PLT 587*   Basic Metabolic Panel: Recent Labs  Lab 03/28/24 0504  NA 138  K 4.5  CL 101  CO2 27  GLUCOSE 165*  BUN 31*  CREATININE 1.24  CALCIUM  9.0   Liver Function Tests: No results for input(s): AST, ALT, ALKPHOS, BILITOT, PROT, ALBUMIN in the last 168 hours. No results for input(s): LIPASE, AMYLASE in the last 168 hours. No results for input(s): AMMONIA in the last 168 hours. Cardiac Enzymes: No results for input(s): CKTOTAL, CKMB, CKMBINDEX, TROPONINI in the last 168 hours. BNP (last 3 results) No results for input(s): BNP in the last 8760 hours.  ProBNP (last 3 results) No results for input(s): PROBNP in the last 8760 hours.  CBG: Recent Labs  Lab 04/01/24 1619 04/01/24 2156 04/01/24 2236 04/02/24 0115 04/02/24 0518  GLUCAP 131* 62* 96 175* 137*   No results found for this or any previous visit (from the past 240 hours).   Studies: No results found.     David Shaler, MD  [facility] 04/02/2024  If 7PM-7AM, please contact night-coverage             "

## 2024-04-02 NOTE — Plan of Care (Signed)

## 2024-04-02 NOTE — TOC Progression Note (Signed)
 Transition of Care [facility]) - Progression Note    Patient Details  Name: David Wyatt MRN: [id number] Date of Birth: 08/06/1960  Transition of Care [facility]) CM/SW Contact  Bridget Cordella Simmonds, LCSW Phone Number: 04/02/2024, 3:01 PM  Clinical Narrative:  Per staffing yesterday, Josefa Labella asked that referral be sent to [facility], which was done.      Expected Discharge Plan: Skilled Nursing Facility Barriers to Discharge: Continued Medical Work up, SNF Pending bed offer               Expected Discharge Plan and Services In-house Referral: Clinical Social Work   Post Acute Care Choice: Skilled Nursing Facility Living arrangements for the past 2 months: Homeless Shelter, Homeless ([employer] and on the street) Expected Discharge Date: 03/16/24                                     Social Drivers of Health (SDOH) Interventions SDOH Screenings   Food Insecurity: Food Insecurity Present (03/29/2024)  Housing: High Risk (03/15/2024)  Transportation Needs: Unmet Transportation Needs (03/15/2024)  Utilities: At Risk (03/15/2024)  Alcohol Screen: Low Risk (12/07/2023)  Depression (PHQ2-9): Medium Risk (12/06/2023)  Financial Resource Strain: High Risk (01/15/2024)  Social Connections: Unknown (03/15/2024)  Tobacco Use: Medium Risk (03/14/2024)    Readmission Risk Interventions     No data to display

## 2024-04-03 DIAGNOSIS — S72142G Displaced intertrochanteric fracture of left femur, subsequent encounter for closed fracture with delayed healing: Secondary | ICD-10-CM | POA: Diagnosis not present

## 2024-04-03 LAB — GLUCOSE, CAPILLARY
Glucose-Capillary: 144 mg/dL — ABNORMAL HIGH (ref 70–99)
Glucose-Capillary: 180 mg/dL — ABNORMAL HIGH (ref 70–99)
Glucose-Capillary: 105 mg/dL — ABNORMAL HIGH (ref 70–99)
Glucose-Capillary: 142 mg/dL — ABNORMAL HIGH (ref 70–99)

## 2024-04-03 NOTE — Plan of Care (Signed)

## 2024-04-03 NOTE — Progress Notes (Signed)
 Physical Therapy Treatment Patient Details Name: David Wyatt MRN: [id number] DOB: 02-03-1961 Today's Date: 04/03/2024   History of Present Illness 64 y.o. male presents 03/13/24 with L hip pain. Imaging shows displaced intertrochanteric fracture on the left. S/p IM rod L hip 1/4. PMH: DM2 with neuropathy, HTN, HLD, substance use, chronic back pain, arthritis.    PT Comments  Pt received in supine, initially reluctant to participate in therapy session, but with max encouragement pt agreeable to transfer/gait training with RN called to request additional pain meds. Pt needing up to modA for sit<>stand from lower bed surface height to RW and up to modA (+2 safety-chair follow) for short household distance gait trial. Reviewed supine LLE exercises for warm-up prior to OOB, pt needed AA this date and c/o increased numbness in his distal L foot and plantar foot surface (pt has neuropathy at baseline). Patient will benefit from continued inpatient follow up therapy, <3 hours/day.    If plan is discharge home, recommend the following: Two people to help with walking and/or transfers;Two people to help with bathing/dressing/bathroom;Assist for transportation;Assistance with cooking/housework;Help with stairs or ramp for entrance   Can travel by private vehicle     No  Equipment Recommendations  Wheelchair (measurements PT);Wheelchair cushion (measurements PT);BSC/3in1    Recommendations for Other Services       Precautions / Restrictions Precautions Precautions: Fall Recall of Precautions/Restrictions: Intact Precaution/Restrictions Comments: tends to flex arms/legs and near-buckle unexpectedly, rec chair follow Restrictions Weight Bearing Restrictions Per Provider Order: Yes LLE Weight Bearing Per Provider Order: Weight bearing as tolerated     Mobility  Bed Mobility Overal bed mobility: Needs Assistance Bed Mobility: Supine to Sit, Sit to Supine     Supine to sit: HOB elevated, Used  rails, Min assist Sit to supine: Supervision, HOB elevated, Used rails   General bed mobility comments: mod cues to initiate; increased time to perform, frequent rest breaks; pt requesting minA for trunk lifting due to pain/reluctance. Took >5 mins to achieve EOB initially.    Transfers Overall transfer level: Needs assistance Equipment used: Rolling walker (2 wheels) Transfers: Sit to/from Stand Sit to Stand: Min assist, Mod assist           General transfer comment: from lowest bed height with modA and increased time to transfer hands from bed to RW. to/from chair with minA    Ambulation/Gait Ambulation/Gait assistance: Mod assist, +2 safety/equipment Gait Distance (Feet): 54 Feet (30ft, seated break, 69ft) Assistive device: Rolling walker (2 wheels) Gait Pattern/deviations: Step-to pattern, Trunk flexed, Shuffle, Knee flexed in stance - left, Knee flexed in stance - right, Decreased step length - right, Antalgic Gait velocity: reduced     General Gait Details: Increased time/effort, variable minA for forward steps to modA due to unsafe technique at times; max cues for tricep elbow activation, forward gaze and step sequencing. Esp needs assist to manage RW while turning and pt does not remember to reach back for arm rest with transfers unless given multiple cues. Perseveration on pain throughout. With max encouragement and chair follow, pt progressed to short hallway distance then returned to room, RN arriving to give pain meds which seemed to encourage him to go a bit further without taking more seated breaks.   Stairs             Wheelchair Mobility     Tilt Bed    Modified Rankin (Stroke Patients Only)       Balance Overall balance assessment: Needs assistance Sitting-balance  support: No upper extremity supported, Feet supported Sitting balance-Leahy Scale: Fair     Standing balance support: Bilateral upper extremity supported, Reliant on assistive device  for balance Standing balance-Leahy Scale: Poor Standing balance comment: min to modA with RW                            Communication Communication Communication: No apparent difficulties Factors Affecting Communication: Reduced clarity of speech  Cognition Arousal: Alert Behavior During Therapy: WFL for tasks assessed/performed   PT - Cognitive impairments: Attention, Awareness, Safety/Judgement, Orientation, Problem solving, Memory   Orientation impairments: Time                   PT - Cognition Comments: Pt very surprised it is Saturday, not Friday. He also has limited recall, reporting no BM yesterday despite meds, however per MD note he did have BM yesterday. Pt initially reluctant/refusing, but with max encouragement, pt participates. Tends to say I can't a lot, pt given positive reinforcement and pt encouraged to work on a thrivent financial. Pt forgetting conversation with PTA yesterday that he agreed to sit up in recliner for an hour or two and that nursing staff would get him back with the [facility], pt states you left me up in the chair. Following commands: Intact      Cueing Cueing Techniques: Verbal cues, Gestural cues, Tactile cues, Visual cues  Exercises General Exercises - Lower Extremity Ankle Circles/Pumps: AAROM, Left, 10 reps, Supine (recliner) Heel Slides: AAROM, Left, 10 reps, Supine Hip ABduction/ADduction: AAROM, Left, 10 reps, Supine    General Comments General comments (skin integrity, edema, etc.): BP 129/81 sitting EOB, HR 89 bpm (checked as pt took prolonged seated break prior to standing/gait trial, but asymptomatic)      Pertinent Vitals/Pain Pain Assessment Pain Assessment: Faces Faces Pain Scale: Hurts even more Pain Location: L hip Pain Descriptors / Indicators: Constant, Sharp, Grimacing (numbness in distal L foot and tingling/tight feeling in areas where he does have sensation) Pain Intervention(s): Limited activity within  patient's tolerance, Monitored during session, Premedicated before session, Repositioned, Patient requesting pain meds-RN notified, RN gave pain meds during session    Home Living                          Prior Function            PT Goals (current goals can now be found in the care plan section) Acute Rehab PT Goals Patient Stated Goal: get stronger and have less pain PT Goal Formulation: With patient Time For Goal Achievement: 04/12/24 Progress towards PT goals: Progressing toward goals    Frequency    Min 2X/week      PT Plan      Co-evaluation              AM-PAC PT 6 Clicks Mobility   Outcome Measure  Help needed turning from your back to your side while in a flat bed without using bedrails?: A Little Help needed moving from lying on your back to sitting on the side of a flat bed without using bedrails?: A Lot (wtihout rail) Help needed moving to and from a bed to a chair (including a wheelchair)?: A Lot Help needed standing up from a chair using your arms (e.g., wheelchair or bedside chair)?: A Lot Help needed to walk in hospital room?: A Lot (chair follow, max cues) Help needed climbing  3-5 steps with a railing? : Total 6 Click Score: 12    End of Session Equipment Utilized During Treatment: Gait belt Activity Tolerance: Patient tolerated treatment well;Patient limited by pain Patient left: with call bell/phone within reach;in bed;with bed alarm set;with SCD's reapplied Nurse Communication: Mobility status;Need for lift equipment;Patient requests pain meds;Other (comment) (unsafe technique ambulating, recommend +2 and Stedy for OOB for safety) PT Visit Diagnosis: Unsteadiness on feet (R26.81);Pain;Other abnormalities of gait and mobility (R26.89);Muscle weakness (generalized) (M62.81) Pain - Right/Left: Left Pain - part of body: Hip     Time: 8993-8962 PT Time Calculation (min) (ACUTE ONLY): 31 min  Charges:    $Gait Training: 8-22  mins $Therapeutic Activity: 8-22 mins PT General Charges $$ ACUTE PT VISIT: 1 Visit                     Lakea Mittelman P., PTA Acute Rehabilitation Services Secure Chat Preferred 9a-5:30pm Office: [phone]    Connell HERO [facility] 04/03/2024, 10:58 AM

## 2024-04-03 NOTE — Progress Notes (Signed)
 " PROGRESS NOTE  David Wyatt FMW:[id number] DOB: Jan 09, 1961 DOA: 03/13/2024 PCP: Delbert Clam, MD   LOS: 21 days   Brief narrative:  Patient is a 64 year old male with past medical history of diabetes mellitus, hypertension, hyperlipidemia, cocaine  abuse, diabetic neuropathy, chronic back pain, arthritis presented to hospital with fall and left hip pain.  X-ray of the hip showed left-sided hip fracture.  Orthopedics was consulted and patient underwent closed reduction and IM fixation during hospitalization.  Postoperatively, he continued to have significant pain and had repeat x-rays and orthopedic evaluation done.  Patient briefly needing a Foley catheter during hospitalization for urinary retention.  Currently awaiting SNF placement   Assessment/Plan: Principal Problem:   Femoral fracture (HCC)  Left hip intertrochanteric femur fracture Status post closed reduction and IM fixation left hip 03/14/2024 , had significant postoperative pain so repeat x-ray were done without any complication.  Orthopedics  recommended ambulation, follow-up in the outpatient office in 6 weeks.     Persisting left leg pain  -likely neuropathic in nature.  Currently on duloxetine  and Neurontin .  Lyrica  has been added to 100 mg twice a day and dose of Neurontin  was decreased.[name]  MRI of the lumbar spine did not show any acute findings but bilateral L4 foraminal narrowing at L4.  Venous Doppler ultrasound of the lower extremities without DVT.  Patient is on muscle relaxant as well.  Continue heat.  Uncertain why patient continues to have severe pain.  Continue to work with physical therapy.   Acute urinary retention Resolved.  Briefly needed Foley catheter.  Uncontrolled DM2 with hyperglycemia and peripheral neuropathy Latest hemoglobin A1c 11.8 -will continue with long-acting insulin , sliding scale insulin  Accu-Cheks.  Diabetic diet.  Closely monitor.   Essential hypertension Overall blood pressure is controlled.    Depression Continue duloxetine    Normocytic anemia No recent labs   History of cocaine  abuse Reinforced quitting   Hyperlipidemia Continue atorvastatin    Tobacco abuse Reinforced quitting.  Continue nicotine  patch.   Constipation.  Had a good bowel movement yesterday.  Continue with bowel regimen at this time.  DVT prophylaxis: SCDs Start: 03/13/24 2315   Disposition: Skilled nursing facility as per PT evaluation.  Status is: Inpatient Remains inpatient appropriate because: Need for rehabilitation, pain control    Code Status:     Code Status: Full Code  Family Communication: None at bedside  Consultants: Orthopedics  Procedures: Closed reduction and internal fixation with IM nail of left hip on 03/14/2024  Anti-infectives:  None  Anti-infectives (From admission, onward)    None        Subjective: Today, patient was seen and examined at bedside.  Patient continues to have left hip pain.  Had constipation and finally got some bowel movement yesterday.    Objective: Vitals:   04/03/24 0254 04/03/24 0736  BP: 109/62 (!) 129/92  Pulse: 84 93  Resp: 16   Temp: 98.5 F (36.9 C) 97.8 F (36.6 C)  SpO2: 93% 94%    Intake/Output Summary (Last 24 hours) at 04/03/2024 1025 Last data filed at 04/03/2024 0555 Gross per 24 hour  Intake --  Output 1800 ml  Net -1800 ml   Filed Weights   03/15/24 1500  Weight: 68 kg   Body mass index is 22.79 kg/m.   Physical Exam:  General:  Average built, not in obvious distress, alert awake and Communicative. HENT:   No scleral pallor or icterus noted. Oral mucosa is moist.  Chest:  Clear breath sounds. No crackles or  wheezes.  CVS: S1 &S2 heard. No murmur.  Regular rate and rhythm. Abdomen: Soft, nontender, nondistended.  Bowel sounds are heard.   Extremities: Leg stable with surgical dressing and edema and tenderness over the medial proximal aspect. Psych: Alert, awake and oriented, normal mood CNS:  No  cranial nerve deficits.  Power equal in all extremities.   Skin: Warm and dry.  No rashes noted.  Data Review: I have personally reviewed the following laboratory data and studies,  CBC: Recent Labs  Lab 03/28/24 0504  WBC 7.1  HGB 9.4*  HCT 29.4*  MCV 94.8  PLT 587*   Basic Metabolic Panel: Recent Labs  Lab 03/28/24 0504  NA 138  K 4.5  CL 101  CO2 27  GLUCOSE 165*  BUN 31*  CREATININE 1.24  CALCIUM  9.0   Liver Function Tests: No results for input(s): AST, ALT, ALKPHOS, BILITOT, PROT, ALBUMIN in the last 168 hours. No results for input(s): LIPASE, AMYLASE in the last 168 hours. No results for input(s): AMMONIA in the last 168 hours. Cardiac Enzymes: No results for input(s): CKTOTAL, CKMB, CKMBINDEX, TROPONINI in the last 168 hours. BNP (last 3 results) No results for input(s): BNP in the last 8760 hours.  ProBNP (last 3 results) No results for input(s): PROBNP in the last 8760 hours.  CBG: Recent Labs  Lab 04/02/24 1614 04/02/24 1619 04/02/24 1620 04/02/24 2051 04/03/24 0613  GLUCAP 50* 56* 52* 134* 144*   No results found for this or any previous visit (from the past 240 hours).   Studies: No results found.     David Vizcarrondo, MD  [facility] 04/03/2024  If 7PM-7AM, please contact night-coverage             "

## 2024-04-03 NOTE — Plan of Care (Signed)
   Problem: Education: Goal: Ability to describe self-care measures that may prevent or decrease complications (Diabetes Survival Skills Education) will improve Outcome: Progressing Goal: Individualized Educational Video(s) Outcome: Progressing

## 2024-04-04 DIAGNOSIS — S72142G Displaced intertrochanteric fracture of left femur, subsequent encounter for closed fracture with delayed healing: Secondary | ICD-10-CM | POA: Diagnosis not present

## 2024-04-04 LAB — GLUCOSE, CAPILLARY
Glucose-Capillary: 122 mg/dL — ABNORMAL HIGH (ref 70–99)
Glucose-Capillary: 64 mg/dL — ABNORMAL LOW (ref 70–99)
Glucose-Capillary: 159 mg/dL — ABNORMAL HIGH (ref 70–99)
Glucose-Capillary: 105 mg/dL — ABNORMAL HIGH (ref 70–99)

## 2024-04-04 MED ORDER — MAGNESIUM CITRATE PO SOLN
1.0000 | Freq: Once | ORAL | Status: AC
  Administered 2024-04-04: 1 via ORAL
  Filled 2024-04-04: qty 296

## 2024-04-04 MED ORDER — ZOLPIDEM TARTRATE 5 MG PO TABS
5.0000 mg | ORAL_TABLET | Freq: Every evening | ORAL | Status: DC | PRN
  Administered 2024-04-05: 5 mg via ORAL
  Filled 2024-04-04: qty 1

## 2024-04-04 NOTE — Progress Notes (Signed)
 " PROGRESS NOTE  David Wyatt FMW:[id number] DOB: October 07, 1960 DOA: 03/13/2024 PCP: David Clam, MD   LOS: 22 days   Brief narrative:  Patient is a 64 year old male with past medical history of diabetes mellitus, hypertension, hyperlipidemia, cocaine  abuse, diabetic neuropathy, chronic back pain, arthritis presented to hospital with fall and left hip pain.  X-ray of the hip showed left-sided hip fracture.  Orthopedics was consulted and patient underwent closed reduction and IM fixation during hospitalization.  Postoperatively, he continued to have significant pain and had repeat x-rays and orthopedic evaluation done.  Patient briefly needing a Foley catheter during hospitalization for urinary retention.  Currently awaiting SNF placement   Assessment/Plan: Principal Problem:   Femoral fracture (HCC)  Left hip intertrochanteric femur fracture Status post closed reduction and IM fixation left hip 03/14/2024 , had significant postoperative pain so repeat x-ray were done without any complication.  Orthopedics  recommended ambulation, follow-up in the outpatient office in 6 weeks.  Still complains of some pain and have been adjusted on medication.   Persisting left leg pain  -likely neuropathic in nature.  Currently on duloxetine  and Neurontin .  Lyrica  has been added to 100 mg twice a day and dose of Neurontin  was decreased.David Wyatt  MRI of the lumbar spine did not show any acute findings but bilateral L4 foraminal narrowing at L4.  Venous Doppler ultrasound of the lower extremities without DVT.  Patient is on muscle relaxant as well.  Continue heat compression.     Acute urinary retention Resolved.  Briefly needed Foley catheter.  Uncontrolled DM2 with hyperglycemia and peripheral neuropathy Latest hemoglobin A1c 11.8 -will continue with long-acting insulin , sliding scale insulin  Accu-Cheks.  Diabetic diet.  Closely monitor.   Essential hypertension Overall blood pressure is controlled.    Depression Continue duloxetine    Normocytic anemia No recent labs   History of cocaine  abuse Reinforced quitting   Hyperlipidemia Continue atorvastatin    Tobacco abuse Reinforced quitting.  Continue nicotine  patch.   Constipation.  Patient feels like he has not emptied his bowel well though the nursing staff states that he did have a good bowel.  Wants to try some magnesium  citrate today.  Continue with bowel regimen.  DVT prophylaxis: SCDs Start: 03/13/24 2315   Disposition: Skilled nursing facility as per PT evaluation.  Status is: Inpatient Remains inpatient appropriate because: Need for rehabilitation, pain control    Code Status:     Code Status: Full Code  Family Communication: None at bedside  Consultants: Orthopedics  Procedures: Closed reduction and internal fixation with IM nail of left hip on 03/14/2024  Anti-infectives:  None  Anti-infectives (From admission, onward)    None      Subjective: Today, patient was seen and examined at bedside.  Patient continues to complain of not having a adequate bowel movement and some hip pain.  No fever chills or rigor.    Objective: Vitals:   04/04/24 0325 04/04/24 0729  BP: (!) 141/81 131/79  Pulse: 90 84  Resp: 18 16  Temp: 98.6 F (37 C) 98.1 F (36.7 C)  SpO2: 94% 98%    Intake/Output Summary (Last 24 hours) at 04/04/2024 1053 Last data filed at 04/04/2024 0324 Gross per 24 hour  Intake 240 ml  Output 1600 ml  Net -1360 ml   Filed Weights   03/15/24 1500  Weight: 68 kg   Body mass index is 22.79 kg/m.   Physical Exam:  General:  Average built, not in obvious distress, alert awake and Communicative HENT:  No scleral pallor or icterus noted. Oral mucosa is moist.  Chest:  Clear breath sounds.  Diminished breath sounds bilaterally. No crackles or wheezes.  CVS: S1 &S2 heard. No murmur.  Regular rate and rhythm. Abdomen: Soft, nontender, nondistended.  Bowel sounds are heard.    Extremities: Left leg with surgical dressing and edema and tenderness over the medial proximal aspect. Psych: Alert, awake and oriented, normal mood CNS:  No cranial nerve deficits.  Moves all extremities. Skin: Warm and dry.  No rashes noted.   Data Review: I have personally reviewed the following laboratory data and studies,  CBC: No results for input(s): WBC, NEUTROABS, HGB, HCT, MCV, PLT in the last 168 hours.  Basic Metabolic Panel: No results for input(s): NA, K, CL, CO2, GLUCOSE, BUN, CREATININE, CALCIUM , MG, PHOS in the last 168 hours.  Liver Function Tests: No results for input(s): AST, ALT, ALKPHOS, BILITOT, PROT, ALBUMIN in the last 168 hours. No results for input(s): LIPASE, AMYLASE in the last 168 hours. No results for input(s): AMMONIA in the last 168 hours. Cardiac Enzymes: No results for input(s): CKTOTAL, CKMB, CKMBINDEX, TROPONINI in the last 168 hours. BNP (last 3 results) No results for input(s): BNP in the last 8760 hours.  ProBNP (last 3 results) No results for input(s): PROBNP in the last 8760 hours.  CBG: Recent Labs  Lab 04/03/24 0613 04/03/24 1124 04/03/24 1620 04/03/24 2112 04/04/24 0617  GLUCAP 144* 180* 105* 142* 122*   No results found for this or any previous visit (from the past 240 hours).   Studies: No results found.     David Ragsdale, MD  [facility] 04/04/2024  If 7PM-7AM, please contact night-coverage       "

## 2024-04-04 NOTE — Plan of Care (Signed)
   Problem: Education: Goal: Ability to describe self-care measures that may prevent or decrease complications (Diabetes Survival Skills Education) will improve Outcome: Progressing Goal: Individualized Educational Video(s) Outcome: Progressing

## 2024-04-04 NOTE — Discharge Instructions (Signed)
 "  [employer]elessness Coordinated Entry Program. If you are experiencing homelessness in [place], [place] , your first point of contact should be Pensions Consultant. You can reach Coordinated Entry by calling [phone] or by emailing [email or web address].  Community access points: [employer] [phone], [place]) every Tuesday from 9am-10am. [facility] ([address] [place]) every Wednesday from 8am-9am.   -The [employer] [phone]) offers several services to local families, as funding allows. The Emergency Assistance Program (EAP), which they administer, provides household goods, free food, clothing, and financial aid to people in need in the [place]  area. The EAP program does have some qualification, and counselors will interview clients for financial assistance by written referral only. Referrals need to be made by the Department of Social Services or by other EAP approved human services agencies or charities in the area.  -[facility] of [employer], which can be reached at [phone], offers emergency assistance programs for those in need of help, such as food, rent assistance, a soup kitchen, shelter, and clothing. They are based in [facility] [place]  but provide a number of services to those that qualify for assistance.   [facility] Department of Social Services may be able to offer temporary financial assistance and cash grants for paying rent and utilities, Help may be provided for local county residents who may be experiencing personal crisis when other resources, including government programs, are not available. Call [phone]  -[facility] [employer] is a [employer] agency, The organization can offer emergency assistance for paying rent, caremark rx, utilities, food,  household products and furniture. They offer extensive emergency and transitional housing for families, children and single women, and also run a Boy's and [employer]. Thrift Shops, Secondary School Teacher, and other aid offered too. [address] [place], [place]  [address], [phone]  -[place] Low Income Energy Assistance Program -- This is offered for [facility] families. The federal government created [employer] Program provides a one-time cash grant payment to help eligible low-income families pay their electric and heating bills. [address] [place], [place]  [address], [phone]  -[place] Emergency Assistance -- A program offers emergency utility and rent funds for greater [place] area residents. The program can also provide counseling and referrals to charities and government programs. Also provides food and a free meal program that serves lunch Mondays - Saturdays and dinner seven days per week to individuals in the community. [address], [place], [place]  [address], [phone]  -[employer] - Offers affordable apartment and housing communities across      [place] and [place]. The low income and seniors can access public housing, rental assistance to qualified applicants, and apply for the section 8 rent subsidy program. Other programs include Chiropractor and Engineer, Maintenance. [address] [place], [place]  [address], dial [phone].  -The [facility] provides transitional housing to veterans and the disabled. Clients will also access other services too, including assistance in applying for Disability, life skills classes, case management, and assistance in finding permanent housing. [address] [place], [place]  [address], call [phone]  -[place] Transitional Housing through [employer] is for  people who were just evicted or that are formerly homeless. The non-profit will also help then gain self-sufficiency, find a home or apartment  to live in, and also provides information on rent assistance when needed. Phone [phone]  -The [facility] helps low income, elderly, or disabled residents in seven counties in the [place] ([place], [place], [place], [place], [place], [place], [place], and [place]) save energy and reduce their utility bills by improving energy efficiency. Phone [phone].  -[employer] is located in the [place] [facility] in the [employer], [address], [place], [place] [address]. Parking is in the rear of the building. Phone: [phone]   General Email: info@gsohc .org  [facility] provides free housing counseling assistance in locating affordable rental housing or housing with support services for families and individuals in crisis and the chronically homeless. We provide potential resources for other housing needs like utilities. Our trained counselors also work with clients on budgeting and financial literacy in effort to empower them to take control of their financial situations. [employer] collaborates with homeless service providers and other stakeholders as part of the [employer] COC (Continuum of Care). The (COC) is a regional/local planning body that coordinates housing and services funding for homeless families and individuals. The role of [facility] in the COC is through housing counseling to work with people we serve on diversion strategies for those that are at imminent risk of becoming homeless. We also work with the Coordinated Assessment/Entry Specialist who attempts to find temporary solutions and/or connects the people to Housing First, Rapid Re-housing or transitional housing programs. Our Homelessness Prevention Housing Counselors meet  with clients on business days (Monday-Fridays, except scheduled holidays) from 8:30 am to 4:30 pm.  Legal assistance for evictions, foreclosure, and more -If you need free legal advice on civil issues, such as foreclosures, evictions, electronics engineer, government programs, domestic issues and more, Armed Forces Operational Officer Aid of [place]  [facility]) is a associate professor firm that provides free legal services and counsel to lower income people, seniors, disabled, and others, The goal is to ensure everyone has access to justice and fair representation. Call them at [phone].  [facility] for Housing and Community Studies can provide info about obtaining legal assistance with evictions. Phone [phone]. Data Processing Manager  The [employer], [employer]. offers job and dispensing optician. Resources are focused on helping students obtain the skills and experiences that are necessary to compete in today's challenging and tight job market. The non-profit faith-based community action agency offers internship trainings as well as classroom instruction. Classes are tailored to meet the needs of people in the [facility] region. [place], [place] [address], [phone] Foreclosure Prevention/Debt Services Family Services of the [employer] Credit Counseling Service inludes debt and foreclosure prevention programs for local families. This includes money management, financial advice, budget review and development of a written action plan with a pensions consultant to help solve specific individual financial problems. In addition, housing and mortgage counselors can also provide pre- and post-purchase homeownership counseling, default resolution counseling (to prevent foreclosure) and reverse mortgage counseling. A Debt Management Program allows people and families with a high level of credit card or medical debt to consolidate and repay consumer debt and loans to  creditors and rebuild positive credit ratings and scores. Contact ([phone]) D7650557. Community Clinics in [place] -Health Department [facility]: [address] [place], [place], [address]. [phone].  -Health Department [place] [facility]: [address], [place], [address]. [phone].  -[facility] offers medical care through a group of doctors, pharmacies  and other healthcare related agencies that offer services for low income, uninsured adults in [place]. Also offers adult Dental care and assistance with applying for an [employer]. Call [phone].   [facility]. This center provides low-cost health care to those without health insurance. Services offered include an onsite pharmacy. Phone [phone]. [address], [place].  -Medication Assistance Program serves as a link between pharmaceutical companies and patients to provide low cost or free prescription medications. This service is available for residents who meet certain income restrictions and have no insurance coverage. PLEASE CALL [phone]) [phone] [place]) OR ([phone] ([place])  -One Step Further: Materials Engineer, The [employer] Support & Nutrition Program, [place]. Call [phone]/ [phone].  Food Emergency Planning/management Officer -[facility]ban [facility]-Food Bank: [address], [place] [address]. Phone [phone]  -[facility] Food Pant[facility]: [address], [place], [place] [address]. [phone].  -[place] Food Finder: https://findfood.bargaincontractor.si  FLEEING VIOLENCE  -Family Services of the [facility]- 24/7 Crisis line [phone]) -[facility] Family Justice Centers: (430) [phone]) 641-SAFE (260)[phone])  Transportation  -[facility] for an application. No fee for people over the age of 65. P: [phone]. Address: [address], [place], [place] [address]  -Senior Wheels Transportation-Age 88 and over. Limit of 1 ride per week for ambulatory participants. Limit 1 ride per month for non-ambulatory participants needing wheelchair transportation. Contact: (336) [phone] ([place]), [phone] [facility]).  -Access [place]: Available for people with disabilities who are unable to use fixed-route bus service. Fee applies. Contact: [phone] (For application requests)/(336) [phone] (Customer service)/(336) [phone] (I-ride reservation line)/(336) [phone] (Access gso reservation line)   Building Services Engineer.org  Homeless Day Center  -[facility])   Day Center: M-F 8a-3p [address]  [place], [place] [address] [phone] Services include: laundry, barbering, support groups, case management, phone & computer access, showers, AA/NA mtgs, mental health/substance abuse nurse, job skills class, disability information, VA assistance, spiritual classes, etc.        HOMELESS SHELTERS [employer] Shelter at [employer]- Call [phone] ext. 347 or ext. 336. Located at [address], [place], [place] [address]  [facility] Mens Shelter- Call [phone]. Located at [address] [place] [address] [place] [address].  [facility]- [employer]. Call [phone]. Office located at [address], [place] [address].  [facility] Family [facility] through [place] [phone].  [facility]- Call [phone]. Located at [address] [place], [place], [place] [address].  [facility] the Inn-For Pregnant mothers. Call [phone]. Located at [address] [place], [address].  [facility]-For men in [place]. Call [phone].  Home of [employer] for [employer] [phone]. Office located at [address] [place], [address].  [employer] be agreeable to help with chores. Call [phone] ext. 5000.  Men's: [address] [place], [place] [address]. Women's: [facility]  [address] [place], [place] [address]   Crisis Services [facility] Mobile Crisis Management- [phone]  [facility] [address] [place], [place] [address]. Phone: [phone]   *[place] 2-1-1 is another useful way to locate resources in the community. Visit shedsizes.ch to find service information online. If you need additional assistance, 2-1-1 Referral Specialists are available 24 hours a day, every day by dialing 2-1-1 or  [phone] from any phone. The call is free, confidential, and available in any language. "

## 2024-04-04 NOTE — Plan of Care (Signed)
   Problem: Coping: Goal: Ability to adjust to condition or change in health will improve Outcome: Progressing   Problem: Fluid Volume: Goal: Ability to maintain a balanced intake and output will improve Outcome: Progressing

## 2024-04-04 NOTE — Care Management (Signed)
 SDOH resources added to AVS

## 2024-04-05 DIAGNOSIS — S72142G Displaced intertrochanteric fracture of left femur, subsequent encounter for closed fracture with delayed healing: Secondary | ICD-10-CM | POA: Diagnosis not present

## 2024-04-05 LAB — GLUCOSE, CAPILLARY
Glucose-Capillary: 153 mg/dL — ABNORMAL HIGH (ref 70–99)
Glucose-Capillary: 56 mg/dL — ABNORMAL LOW (ref 70–99)
Glucose-Capillary: 136 mg/dL — ABNORMAL HIGH (ref 70–99)
Glucose-Capillary: 103 mg/dL — ABNORMAL HIGH (ref 70–99)

## 2024-04-05 MED ORDER — INSULIN ASPART 100 UNIT/ML IJ SOLN
3.0000 [IU] | Freq: Three times a day (TID) | INTRAMUSCULAR | Status: DC
  Administered 2024-04-05 – 2024-04-07 (×5): 3 [IU] via SUBCUTANEOUS
  Filled 2024-04-05 (×5): qty 3

## 2024-04-05 MED ORDER — INSULIN ASPART 100 UNIT/ML IJ SOLN
0.0000 [IU] | Freq: Every day | INTRAMUSCULAR | Status: DC
  Filled 2024-04-05: qty 2

## 2024-04-05 MED ORDER — INSULIN ASPART 100 UNIT/ML IJ SOLN
0.0000 [IU] | Freq: Three times a day (TID) | INTRAMUSCULAR | Status: DC
  Administered 2024-04-05: 1 [IU] via SUBCUTANEOUS
  Administered 2024-04-06: 2 [IU] via SUBCUTANEOUS
  Administered 2024-04-06: 1 [IU] via SUBCUTANEOUS
  Administered 2024-04-07: 2 [IU] via SUBCUTANEOUS
  Administered 2024-04-07: 1 [IU] via SUBCUTANEOUS
  Administered 2024-04-08: 2 [IU] via SUBCUTANEOUS
  Administered 2024-04-08: 1 [IU] via SUBCUTANEOUS
  Administered 2024-04-08: 2 [IU] via SUBCUTANEOUS
  Administered 2024-04-09: 1 [IU] via SUBCUTANEOUS
  Administered 2024-04-09 (×2): 3 [IU] via SUBCUTANEOUS
  Administered 2024-04-10 (×2): 1 [IU] via SUBCUTANEOUS
  Administered 2024-04-11: 2 [IU] via SUBCUTANEOUS
  Administered 2024-04-12 (×2): 1 [IU] via SUBCUTANEOUS
  Filled 2024-04-05 (×2): qty 2
  Filled 2024-04-05: qty 1
  Filled 2024-04-05: qty 3
  Filled 2024-04-05: qty 1
  Filled 2024-04-05: qty 3
  Filled 2024-04-05: qty 1
  Filled 2024-04-05: qty 3
  Filled 2024-04-05 (×3): qty 1
  Filled 2024-04-05: qty 2
  Filled 2024-04-05: qty 1
  Filled 2024-04-05: qty 2

## 2024-04-05 NOTE — Inpatient Diabetes Management (Signed)
 Inpatient Diabetes Program Recommendations  AACE/ADA: New Consensus Statement on Inpatient Glycemic Control (2015)  Target Ranges:  Prepandial:   less than 140 mg/dL      Peak postprandial:   less than 180 mg/dL (1-2 hours)      Critically ill patients:  140 - 180 mg/dL   Lab Results  Component Value Date   GLUCAP 56 (L) 04/05/2024   HGBA1C 11.8 (H) 03/08/2024    Review of Glycemic Control  Latest Reference Range & Units 04/04/24 11:50 04/04/24 16:28 04/04/24 21:55 04/05/24 06:29 04/05/24 11:20  Glucose-Capillary 70 - 99 mg/dL 64 (L) 840 (H) 894 (H) 153 (H) 56 (L)   Diabetes history: DM 2 Outpatient Diabetes medications:  Novolog  2-12 units tid with meals Lantus  15 units daily Metformin  1000 mg bid Current orders for Inpatient glycemic control:  Novolog  0-15 units tid with meals and HS Semglee  30 units daily Novolog  4 units tid with meals  Metformin  1000 mg bid Inpatient Diabetes Program Recommendations:   Patient is having lows after meal coverage plus correction. Consider further reduction of Novolog  meal coverage to 3 units and reducing Novolog  correction to sensitive (0-9 units) tid with meals.   Thanks,  Randall Bullocks, RN, BC-ADM Inpatient Diabetes Coordinator Pager [phone]  (8a-5p)

## 2024-04-05 NOTE — Progress Notes (Signed)
 " PROGRESS NOTE  David Wyatt FMW:[id number] DOB: 02-03-1961 DOA: 03/13/2024 PCP: Delbert Clam, MD   LOS: 23 days   Brief narrative:  Patient is a 64 year old male with past medical history of diabetes mellitus, hypertension, hyperlipidemia, cocaine  abuse, diabetic neuropathy, chronic back pain, arthritis presented to hospital with fall and left hip pain.  X-ray of the hip showed left-sided hip fracture.  Orthopedics was consulted and patient underwent closed reduction and IM fixation during hospitalization.  Postoperatively, patient continued to have significant pain and had repeat x-rays and orthopedic evaluation done.  Patient briefly needed a Foley catheter during hospitalization for urinary retention.  Currently awaiting SNF placement   Assessment/Plan: Principal Problem:   Femoral fracture (HCC)  Left hip intertrochanteric femur fracture Status post closed reduction and IM fixation left hip 03/14/2024 , had significant postoperative pain so repeat x-ray were done without any complication.  Orthopedics recommended ambulation, follow-up in the outpatient office in 6 weeks.  Patient complains of numbness over the fluid difficulty moving the left leg and pain.    Persisting left leg pain  -likely neuropathic in nature.  Currently on duloxetine  and Neurontin .  Lyrica  has been added to 100 mg twice a day and dose of Neurontin  was decreased.[name]  MRI of the lumbar spine did not show any acute findings but bilateral L4 foraminal narrowing at L4.  Venous Doppler ultrasound of the lower extremities without DVT.  Continue muscle relaxant, heating pad    Acute urinary retention Resolved.  Briefly needed Foley catheter.  Uncontrolled DM2 with hyperglycemia and peripheral neuropathy Latest hemoglobin A1c 11.8 ,will continue with long-acting insulin , sliding scale insulin  Accu-Cheks.  Diabetic diet.  Closely monitor.  POC glucose of 153.   Essential hypertension Overall blood pressure is controlled.   Blood pressure of 119/71   Depression Continue duloxetine    Normocytic anemia No recent labs   History of cocaine  abuse Reinforced quitting   Hyperlipidemia Continue atorvastatin    Tobacco abuse Reinforced quitting.  Continue nicotine  patch.   Severe constipation.  Had good bowel movements yesterday.  Continue with bowel regimen.  DVT prophylaxis: SCDs Start: 03/13/24 2315   Disposition: Skilled nursing facility as per PT evaluation.  Status is: Inpatient Remains inpatient appropriate because: Need for rehabilitation,     Code Status:     Code Status: Full Code  Family Communication: None at bedside  Consultants: Orthopedics  Procedures: Closed reduction and internal fixation with IM nail of left hip on 03/14/2024  Anti-infectives:  None  Anti-infectives (From admission, onward)    None      Subjective: Today, patient was seen and examined at bedside.  Patient complains of good bowel movements.   Complains of foot numbness and neuropathy.  Objective: Vitals:   04/05/24 0334 04/05/24 0804  BP: (!) 138/90 119/71  Pulse: 85 85  Resp: 16 16  Temp: 98.3 F (36.8 C) 98.6 F (37 C)  SpO2: 95% 95%    Intake/Output Summary (Last 24 hours) at 04/05/2024 0955 Last data filed at 04/04/2024 2057 Gross per 24 hour  Intake --  Output 600 ml  Net -600 ml   Filed Weights   03/15/24 1500  Weight: 68 kg   Body mass index is 22.79 kg/m.   Physical Exam:  General:  Average built, not in obvious distress, alert awake and Communicative, HENT:   No scleral pallor or icterus noted. Oral mucosa is moist.  Chest: Diminished breath sounds bilaterally. CVS: S1 &S2 heard. No murmur.  Regular rate and rhythm.  Abdomen: Soft, nontender, nondistended.  Bowel sounds are heard.   Extremities: Left leg with surgical dressing and edema. Psych: Alert, awake and oriented, normal mood CNS:  No cranial nerve deficits.  Moves all extremities Skin: Warm and dry.  No rashes  noted.   Data Review: I have personally reviewed the following laboratory data and studies,  CBC: No results for input(s): WBC, NEUTROABS, HGB, HCT, MCV, PLT in the last 168 hours.  Basic Metabolic Panel: No results for input(s): NA, K, CL, CO2, GLUCOSE, BUN, CREATININE, CALCIUM , MG, PHOS in the last 168 hours.  Liver Function Tests: No results for input(s): AST, ALT, ALKPHOS, BILITOT, PROT, ALBUMIN in the last 168 hours. No results for input(s): LIPASE, AMYLASE in the last 168 hours. No results for input(s): AMMONIA in the last 168 hours. Cardiac Enzymes: No results for input(s): CKTOTAL, CKMB, CKMBINDEX, TROPONINI in the last 168 hours. BNP (last 3 results) No results for input(s): BNP in the last 8760 hours.  ProBNP (last 3 results) No results for input(s): PROBNP in the last 8760 hours.  CBG: Recent Labs  Lab 04/04/24 0617 04/04/24 1150 04/04/24 1628 04/04/24 2155 04/05/24 0629  GLUCAP 122* 64* 159* 105* 153*   No results found for this or any previous visit (from the past 240 hours).   Studies: No results found.     Vernal Alstrom, MD  [facility] 04/05/2024  If 7PM-7AM, please contact night-coverage       "

## 2024-04-05 NOTE — Plan of Care (Signed)
   Problem: Education: Goal: Ability to describe self-care measures that may prevent or decrease complications (Diabetes Survival Skills Education) will improve Outcome: Progressing Goal: Individualized Educational Video(s) Outcome: Progressing

## 2024-04-05 NOTE — TOC Progression Note (Signed)
 Transition of Care [facility]) - Progression Note    Patient Details  Name: David Wyatt MRN: [id number] Date of Birth: 13-Jul-1960  Transition of Care [facility]) CM/SW Contact  Bridget Cordella Simmonds, LCSW Phone Number: 04/05/2024, 2:08 PM  Clinical Narrative:   [name]  [facility] [place] ([place]) could accept pt as LTC pt only.  This would require pt to commit to minimum of 90 day stay.    Expected Discharge Plan: Skilled Nursing Facility Barriers to Discharge: Continued Medical Work up, SNF Pending bed offer               Expected Discharge Plan and Services In-house Referral: Clinical Social Work   Post Acute Care Choice: Skilled Nursing Facility Living arrangements for the past 2 months: Homeless Shelter, Homeless ([employer] and on the street) Expected Discharge Date: 03/16/24                                     Social Drivers of Health (SDOH) Interventions SDOH Screenings   Food Insecurity: Food Insecurity Present (03/29/2024)  Housing: High Risk (03/15/2024)  Transportation Needs: Unmet Transportation Needs (03/15/2024)  Utilities: At Risk (03/15/2024)  Alcohol Screen: Low Risk (12/07/2023)  Depression (PHQ2-9): Medium Risk (12/06/2023)  Financial Resource Strain: High Risk (01/15/2024)  Social Connections: Unknown (03/15/2024)  Tobacco Use: Medium Risk (03/14/2024)    Readmission Risk Interventions     No data to display

## 2024-04-06 DIAGNOSIS — S72142G Displaced intertrochanteric fracture of left femur, subsequent encounter for closed fracture with delayed healing: Secondary | ICD-10-CM | POA: Diagnosis not present

## 2024-04-06 LAB — GLUCOSE, CAPILLARY
Glucose-Capillary: 123 mg/dL — ABNORMAL HIGH (ref 70–99)
Glucose-Capillary: 105 mg/dL — ABNORMAL HIGH (ref 70–99)
Glucose-Capillary: 173 mg/dL — ABNORMAL HIGH (ref 70–99)
Glucose-Capillary: 140 mg/dL — ABNORMAL HIGH (ref 70–99)
Glucose-Capillary: 60 mg/dL — ABNORMAL LOW (ref 70–99)

## 2024-04-06 MED ORDER — ZOLPIDEM TARTRATE 5 MG PO TABS
5.0000 mg | ORAL_TABLET | Freq: Every day | ORAL | Status: DC
  Administered 2024-04-06 – 2024-04-11 (×6): 5 mg via ORAL
  Filled 2024-04-06 (×6): qty 1

## 2024-04-06 NOTE — Progress Notes (Signed)
 Physical Therapy Treatment Patient Details Name: David Wyatt MRN: [id number] DOB: [birth year] Today's Date: 04/06/2024   History of Present Illness 64 y.o. male presents 03/13/24 with L hip pain. Imaging shows displaced intertrochanteric fracture on the left. S/p IM rod L hip 1/4. PMH: DM2 with neuropathy, HTN, HLD, substance use, chronic back pain, arthritis.    PT Comments  Pt received in supine, agreeable to therapy session ~10 mins after premedication by RN, pt slow to initiate but once sitting EOB, pt agreeable to work on standing/gait training with improved effort and tolerance this session. Pt needing up to modA to perform bed mobility and up to minA (+2) for transfers and gait with close chair follow for safety for household distance gait trial. No buckling this session and pt with improved RW management and UE support to offload LLE pain this date. Pt demos forward-thinking this session, asking about when he will be able to progress past needing RW, and given positive reinforcement of his progress in the past week. Pt reports he does not have reading glasses and needs a strong prescription to be able to read typically. Patient will benefit from continued inpatient follow up therapy, <3 hours/day, he is making excellent progress toward his goals this date.     If plan is discharge home, recommend the following: Assist for transportation;Assistance with cooking/housework;Help with stairs or ramp for entrance;A lot of help with walking and/or transfers;A lot of help with bathing/dressing/bathroom   Can travel by private vehicle      (may do better with wheelchair fleeta or ambulance depending on pain)  Equipment Recommendations  Wheelchair (measurements PT);Wheelchair cushion (measurements PT);BSC/3in1 (RW vs WC pending progress)    Recommendations for Other Services       Precautions / Restrictions Precautions Precautions: Fall Recall of Precautions/Restrictions:  Intact Restrictions Weight Bearing Restrictions Per Provider Order: Yes LLE Weight Bearing Per Provider Order: Weight bearing as tolerated     Mobility  Bed Mobility Overal bed mobility: Needs Assistance Bed Mobility: Supine to Sit     Supine to sit: Mod assist     General bed mobility comments: pt encouraged to use gait belt as a leg lifter, but limited effort due to LLE pain. Pt requesting HHA to raise trunk. ModA from flat bed/no rails.    Transfers Overall transfer level: Needs assistance Equipment used: Rolling walker (2 wheels) Transfers: Sit to/from Stand Sit to Stand: Min assist, +2 physical assistance           General transfer comment: From lowest bed height to RW and RW>chair. Cues for safe UE placement needed each transfer, pt pushing up from end of bed rail with RUE initially which seemed to help with transitioning his hands up to RW this date.    Ambulation/Gait Ambulation/Gait assistance: Min assist, +2 safety/equipment Gait Distance (Feet): 50 Feet (x2 with prolonged standing rest break) Assistive device: Rolling walker (2 wheels) Gait Pattern/deviations: Step-to pattern, Decreased step length - right, Antalgic, Decreased weight shift to left, Decreased dorsiflexion - left Gait velocity: reduced Gait velocity interpretation: <1.31 ft/sec, indicative of household ambulator   General Gait Details: Increased time/effort but pt with some carryover of postural cues and step sequencing with RW from prior session. Up to minA for RW management/stability and no buckling this date. Pt expressed concern that he can't tolerate weight through his LLE, PTA had him place his foot over therapist foot and he appears to be weight bearing ~25-50% on LLE, PTA reinforced that this is improved  from prior sessions. SpO2/HR WFL on RA. Pt needs min cues for forward gaze and step sequencing, fewer cues than prior. Pt agreeable to go further in hallway this date with encouragement and  chair follow for safety, needing chair pulled up behind him as he got back to room, before reaching bed. Pt then agreeable to sit up in chair ~1 hour with encouragement so sheets can be changed and to build tolerance for OOB activity/rehab.   Stairs             Wheelchair Mobility     Tilt Bed    Modified Rankin (Stroke Patients Only)       Balance Overall balance assessment: Needs assistance Sitting-balance support: No upper extremity supported, Feet supported Sitting balance-Leahy Scale: Good     Standing balance support: Reliant on assistive device for balance Standing balance-Leahy Scale: Poor Standing balance comment: heavy UE reliance on RW                            Communication Communication Communication: Impaired Factors Affecting Communication: Reduced clarity of speech  Cognition Arousal: Alert Behavior During Therapy: WFL for tasks assessed/performed   PT - Cognitive impairments: Sequencing, Safety/Judgement                       PT - Cognition Comments: Some decreased recall of details from prior sessions, but pt with improved motivation this date to progress. Following commands: Intact Following commands impaired: Only follows one step commands consistently    Cueing Cueing Techniques: Verbal cues, Gestural cues, Tactile cues  Exercises Other Exercises Other Exercises: Reviewed ankle pumps, use of IS frequently throughout the day.    General Comments General comments (skin integrity, edema, etc.): SpO2/HR WFL on RA ambulating per pulse ox.      Pertinent Vitals/Pain Pain Assessment Pain Assessment: Faces Faces Pain Scale: Hurts even more Pain Location: L hip Pain Descriptors / Indicators: Constant, Sore, Grimacing, Guarding Pain Intervention(s): Limited activity within patient's tolerance, Monitored during session, Repositioned, Premedicated before session    Home Living                          Prior  Function            PT Goals (current goals can now be found in the care plan section) Acute Rehab PT Goals Patient Stated Goal: get stronger and have less pain PT Goal Formulation: With patient Time For Goal Achievement: 04/12/24 Progress towards PT goals: Progressing toward goals    Frequency    Min 2X/week      PT Plan      Co-evaluation PT/OT/SLP Co-Evaluation/Treatment: Yes Reason for Co-Treatment: Complexity of the patient's impairments (multi-system involvement);For patient/therapist safety;To address functional/ADL transfers PT goals addressed during session: Mobility/safety with mobility;Proper use of DME;Balance OT goals addressed during session: ADL's and self-care;Strengthening/ROM      AM-PAC PT 6 Clicks Mobility   Outcome Measure  Help needed turning from your back to your side while in a flat bed without using bedrails?: A Little Help needed moving from lying on your back to sitting on the side of a flat bed without using bedrails?: A Lot Help needed moving to and from a bed to a chair (including a wheelchair)?: A Little Help needed standing up from a chair using your arms (e.g., wheelchair or bedside chair)?: A Little Help needed to walk in  hospital room?: A Lot Help needed climbing 3-5 steps with a railing? : Total 6 Click Score: 14    End of Session Equipment Utilized During Treatment: Gait belt Activity Tolerance: Patient tolerated treatment well Patient left: in chair;with call bell/phone within reach;with chair alarm set;Other (comment) (recliner, pt given x2 crackers per request (RN approval), secretary notified pt needs a clock for his wall, RN notified he is not likely to tolerate up in chair >1 hour) Nurse Communication: Mobility status;Patient requests pain meds;Other (comment) (needs a clock, may not sit up >1 hour) PT Visit Diagnosis: Unsteadiness on feet (R26.81);Pain;Other abnormalities of gait and mobility (R26.89);Muscle weakness  (generalized) (M62.81) Pain - Right/Left: Left Pain - part of body: Hip     Time: 8546-8478 PT Time Calculation (min) (ACUTE ONLY): 28 min  Charges:    $Gait Training: 8-22 mins PT General Charges $$ ACUTE PT VISIT: 1 Visit                     David Wyatt P., PTA Acute Rehabilitation Services Secure Chat Preferred 9a-5:30pm Office: [phone]    David Wyatt 04/06/2024, 4:50 PM

## 2024-04-06 NOTE — Progress Notes (Signed)
 Occupational Therapy Treatment Patient Details Name: David Wyatt MRN: [id number] DOB: 1960-05-06 Today's Date: 04/06/2024   History of present illness 64 y.o. male presents 03/13/24 with L hip pain. Imaging shows displaced intertrochanteric fracture on the left. S/p IM rod L hip 1/4. PMH: DM2 with neuropathy, HTN, HLD, substance use, chronic back pain, arthritis.   OT comments  Patient with good progress toward patient focused goals.  Improving mobility, should now begin to impact ADL independence.  Discussed transitioning from seated ADL to standing with hip kit as needed.  OT will continue efforts in the acute setting and Patient will benefit from continued inpatient follow up therapy, <3 hours/day.      If plan is discharge home, recommend the following:  Assistance with cooking/housework;Assist for transportation;A lot of help with bathing/dressing/bathroom;A little help with walking and/or transfers   Equipment Recommendations  BSC/3in1;Tub/shower bench    Recommendations for Other Services      Precautions / Restrictions Precautions Precautions: Fall Recall of Precautions/Restrictions: Intact Restrictions Weight Bearing Restrictions Per Provider Order: Yes LLE Weight Bearing Per Provider Order: Weight bearing as tolerated       Mobility Bed Mobility Overal bed mobility: Needs Assistance Bed Mobility: Supine to Sit     Supine to sit: Mod assist          Transfers Overall transfer level: Needs assistance Equipment used: Rolling walker (2 wheels) Transfers: Sit to/from Stand, Bed to chair/wheelchair/BSC Sit to Stand: Min assist, +2 physical assistance     Step pivot transfers: Min assist           Balance Overall balance assessment: Needs assistance Sitting-balance support: No upper extremity supported, Feet supported Sitting balance-Leahy Scale: Good     Standing balance support: Reliant on assistive device for balance Standing balance-Leahy Scale:  Poor                             ADL either performed or assessed with clinical judgement   ADL       Grooming: Wash/dry face;Oral care;Set up;Sitting               Lower Body Dressing: Maximal assistance;Sitting/lateral leans;Sit to/from stand   Toilet Transfer: Minimal assistance;Ambulation;Rolling walker (2 wheels);Comfort height toilet                  Extremity/Trunk Assessment Upper Extremity Assessment Upper Extremity Assessment: Overall WFL for tasks assessed RUE Deficits / Details: reports neuropathy in hands LUE Deficits / Details: reports neuropathy in hands   Lower Extremity Assessment Lower Extremity Assessment: Defer to PT evaluation        Vision Patient Visual Report: No change from baseline Additional Comments: Wears reading glasses   Perception Perception Perception: Not tested   Praxis Praxis Praxis: Not tested   Communication Communication Communication: Impaired Factors Affecting Communication: Reduced clarity of speech   Cognition Arousal: Alert Behavior During Therapy: WFL for tasks assessed/performed Cognition: No apparent impairments                               Following commands: Intact Following commands impaired: Only follows one step commands consistently      Cueing   Cueing Techniques: Verbal cues, Gestural cues  Exercises      Shoulder Instructions       General Comments      Pertinent Vitals/ Pain  Pain Assessment Pain Assessment: Faces Faces Pain Scale: Hurts even more Pain Location: L hip Pain Descriptors / Indicators: Constant, Grimacing, Sore Pain Intervention(s): Monitored during session, Premedicated before session                                                          Frequency  Min 2X/week        Progress Toward Goals  OT Goals(current goals can now be found in the care plan section)  Progress towards OT goals: Progressing  toward goals  Acute Rehab OT Goals OT Goal Formulation: With patient Time For Goal Achievement: 04/20/24 Potential to Achieve Goals: Good ADL Goals Pt Will Perform Lower Body Dressing: sit to/from stand;with contact guard assist;with adaptive equipment Pt Will Transfer to Toilet: with contact guard assist;ambulating;regular height toilet  Plan      Co-evaluation    PT/OT/SLP Co-Evaluation/Treatment: Yes Reason for Co-Treatment: Complexity of the patient's impairments (multi-system involvement);For patient/therapist safety;To address functional/ADL transfers   OT goals addressed during session: ADL's and self-care;Strengthening/ROM      AM-PAC OT 6 Clicks Daily Activity     Outcome Measure   Help from another person eating meals?: None Help from another person taking care of personal grooming?: A Little Help from another person toileting, which includes using toliet, bedpan, or urinal?: A Little Help from another person bathing (including washing, rinsing, drying)?: A Lot Help from another person to put on and taking off regular upper body clothing?: None Help from another person to put on and taking off regular lower body clothing?: A Lot 6 Click Score: 18    End of Session Equipment Utilized During Treatment: Gait belt;Rolling walker (2 wheels)  OT Visit Diagnosis: Unsteadiness on feet (R26.81);Muscle weakness (generalized) (M62.81);History of falling (Z91.81);Pain Pain - Right/Left: Left Pain - part of body: Hip   Activity Tolerance Patient tolerated treatment well   Patient Left in chair;with call bell/phone within reach;with chair alarm set   Nurse Communication Other (comment) (wanting a snack)        Time: 8498-8475 OT Time Calculation (min): 23 min  Charges: OT General Charges $OT Visit: 1 Visit OT Treatments $Therapeutic Activity: 8-22 mins  04/06/2024  RP, OTR/L  Acute Rehabilitation Services  Office:  [phone]   Charlie JONETTA Halsted 04/06/2024, 3:35 PM

## 2024-04-06 NOTE — Progress Notes (Signed)
 " PROGRESS NOTE  David Wyatt FMW:[id number] DOB: 03/29/1960 DOA: 03/13/2024 PCP: Delbert Clam, MD   LOS: 24 days   Brief narrative:  Patient is a 64 year old male with past medical history of diabetes mellitus, hypertension, hyperlipidemia, cocaine  abuse, diabetic neuropathy, chronic back pain, arthritis presented to hospital with fall and left hip pain.  X-ray of the hip showed left-sided hip fracture.  Orthopedics was consulted and patient underwent closed reduction and IM fixation during hospitalization.  Postoperatively, patient continued to have significant pain and had repeat x-rays and orthopedic evaluation done.  Patient briefly needed a Foley catheter during hospitalization for urinary retention.  Currently awaiting SNF placement   Assessment/Plan: Principal Problem:   Femoral fracture (HCC)  Left hip intertrochanteric femur fracture Status post closed reduction and IM fixation left hip 03/14/2024 , had significant postoperative pain so repeat x-ray were done without any complication.  Orthopedics recommended ambulation, follow-up in the outpatient office in 6 weeks.  PT OT continuing while in the hospital awaiting for skilled facility.  Persisting left leg pain  -likely neuropathic in nature.  Currently on duloxetine  and Neurontin .  Lyrica  has been added to 100 mg twice a day and dose of Neurontin  was decreased.[name]  MRI of the lumbar spine did not show any acute findings but bilateral L4 foraminal narrowing at L4.  Venous Doppler ultrasound of the lower extremities without DVT.  Continue muscle relaxant, heating pad.   Acute urinary retention Resolved.  Briefly needed Foley catheter.  Currently able to urinate on his own.  Uncontrolled DM2 with hyperglycemia and peripheral neuropathy Latest hemoglobin A1c 11.8 ,will continue with long-acting insulin , sliding scale insulin  Accu-Cheks.  Diabetic diet.  Closely monitor.  POC glucose of 105.   Essential hypertension Overall blood  pressure is controlled.  Blood pressure of 119/71   Depression Continue duloxetine    Normocytic anemia No recent labs   History of cocaine  abuse Reinforced quitting   Hyperlipidemia Continue atorvastatin    Tobacco abuse Reinforced quitting.  Continue nicotine  patch.   Severe constipation.  Resolved.  Will continue with bowel regimen.  DVT prophylaxis: SCDs Start: 03/13/24 2315   Disposition: Skilled nursing facility as per PT evaluation.  Medically stable for disposition  Status is: Inpatient Remains inpatient appropriate because: Need for rehabilitation,     Code Status:     Code Status: Full Code  Family Communication: None at bedside  Consultants: Orthopedics  Procedures: Closed reduction and internal fixation with IM nail of left hip on 03/14/2024  Anti-infectives:  None  Anti-infectives (From admission, onward)    None      Subjective: Today, patient was seen and examined at bedside.  Patient complains of having good bowel movements.  Denies any nausea vomiting fever chills or rigor.  Still has some hip pain and numbness of the foot.    Objective: Vitals:   04/06/24 0221 04/06/24 0940  BP: 118/69 124/76  Pulse: 93 86  Resp: 17 18  Temp:    SpO2: 95% 96%    Intake/Output Summary (Last 24 hours) at 04/06/2024 1318 Last data filed at 04/06/2024 1235 Gross per 24 hour  Intake 460 ml  Output 1600 ml  Net -1140 ml   Filed Weights   03/15/24 1500  Weight: 68 kg   Body mass index is 22.79 kg/m.   Physical Exam:  General:  Average built, not in obvious distress, alert awake and Communicative HENT:   No scleral pallor or icterus noted. Oral mucosa is moist.  Chest: Diminished breath  sounds bilaterally CVS: S1 &S2 heard. No murmur.  Regular rate and rhythm. Abdomen: Soft, nontender, nondistended.  Bowel sounds are heard.   Extremities: Left leg with surgical dressing and edema Psych: Alert, awake and oriented, normal mood CNS:  No cranial  nerve deficits.  Moves all extremities Skin: Warm and dry.  No rashes noted.  Data Review: I have personally reviewed the following laboratory data and studies,  CBC: No results for input(s): WBC, NEUTROABS, HGB, HCT, MCV, PLT in the last 168 hours.  Basic Metabolic Panel: No results for input(s): NA, K, CL, CO2, GLUCOSE, BUN, CREATININE, CALCIUM , MG, PHOS in the last 168 hours.  Liver Function Tests: No results for input(s): AST, ALT, ALKPHOS, BILITOT, PROT, ALBUMIN in the last 168 hours. No results for input(s): LIPASE, AMYLASE in the last 168 hours. No results for input(s): AMMONIA in the last 168 hours. Cardiac Enzymes: No results for input(s): CKTOTAL, CKMB, CKMBINDEX, TROPONINI in the last 168 hours. BNP (last 3 results) No results for input(s): BNP in the last 8760 hours.  ProBNP (last 3 results) No results for input(s): PROBNP in the last 8760 hours.  CBG: Recent Labs  Lab 04/05/24 1120 04/05/24 1624 04/05/24 2037 04/06/24 0554 04/06/24 1113  GLUCAP 56* 136* 103* 123* 105*   No results found for this or any previous visit (from the past 240 hours).   Studies: No results found.     Timothy Townsel, MD  [facility] 04/06/2024  If 7PM-7AM, please contact night-coverage       "

## 2024-04-06 NOTE — TOC Progression Note (Signed)
 Transition of Care [facility]) - Progression Note    Patient Details  Name: David Wyatt MRN: [id number] Date of Birth: 1960-09-19  [facility]) CM/SW Contact  Bridget Cordella Simmonds, LCSW Phone Number: 04/06/2024, 4:15 PM  Clinical Narrative:   CSW spoke with pt about bed offer at [facility] near [place], requirement that pt would have to stay minimum 3 months. Pt is willing to accept this offer.  CSW spoke with [name]/[facility], who covers [facility].  She will come meet with pt tomorrow, will need some financial information.     Expected Discharge Plan: Skilled Nursing Facility Barriers to Discharge: Continued Medical Work up, SNF Pending bed offer               Expected Discharge Plan and Services In-house Referral: Clinical Social Work   Post Acute Care Choice: Skilled Nursing Facility Living arrangements for the past 2 months: Homeless Shelter, Homeless ([employer] and on the street) Expected Discharge Date: 03/16/24                                     Social Drivers of Health (SDOH) Interventions SDOH Screenings   Food Insecurity: Food Insecurity Present (03/29/2024)  Housing: High Risk (03/15/2024)  Transportation Needs: Unmet Transportation Needs (03/15/2024)  Utilities: At Risk (03/15/2024)  Alcohol Screen: Low Risk (12/07/2023)  Depression (PHQ2-9): Medium Risk (12/06/2023)  Financial Resource Strain: High Risk (01/15/2024)  Social Connections: Unknown (03/15/2024)  Tobacco Use: Medium Risk (03/14/2024)    Readmission Risk Interventions     No data to display

## 2024-04-07 DIAGNOSIS — S72142G Displaced intertrochanteric fracture of left femur, subsequent encounter for closed fracture with delayed healing: Secondary | ICD-10-CM | POA: Diagnosis not present

## 2024-04-07 LAB — GLUCOSE, CAPILLARY
Glucose-Capillary: 91 mg/dL (ref 70–99)
Glucose-Capillary: 109 mg/dL — ABNORMAL HIGH (ref 70–99)
Glucose-Capillary: 174 mg/dL — ABNORMAL HIGH (ref 70–99)
Glucose-Capillary: 136 mg/dL — ABNORMAL HIGH (ref 70–99)
Glucose-Capillary: 154 mg/dL — ABNORMAL HIGH (ref 70–99)

## 2024-04-07 MED ORDER — INSULIN GLARGINE-YFGN 100 UNIT/ML ~~LOC~~ SOLN
20.0000 [IU] | Freq: Every day | SUBCUTANEOUS | Status: DC
  Administered 2024-04-08 – 2024-04-12 (×5): 20 [IU] via SUBCUTANEOUS
  Filled 2024-04-07 (×6): qty 0.2

## 2024-04-07 MED ORDER — GERHARDT'S BUTT CREAM
1.0000 | TOPICAL_CREAM | Freq: Every day | CUTANEOUS | Status: DC
  Administered 2024-04-07 – 2024-04-12 (×6): 1 via TOPICAL
  Filled 2024-04-07: qty 60

## 2024-04-07 MED ORDER — INSULIN GLARGINE-YFGN 100 UNIT/ML ~~LOC~~ SOLN: 20.0000 [IU] | Freq: Every day | SUBCUTANEOUS | Status: DC

## 2024-04-07 NOTE — Progress Notes (Signed)
 Physical Therapy Treatment Patient Details Name: David Wyatt MRN: [id number] DOB: 1960-11-27 Today's Date: 04/07/2024   History of Present Illness 64 y.o. male presents 03/13/24 with L hip pain. Imaging shows displaced intertrochanteric fracture on the left. S/p IM rod L hip 1/4. PMH: DM2 with neuropathy, HTN, HLD, substance use, chronic back pain, arthritis.    PT Comments  Pt received up on BSC, agreeable therapy session as he was premedicated within the past 2-3 hours, NT called to room for pt safety to assist with posterior hygiene in standing, pt did not have BM however. Pt needing up to minA for transfers with mod safety cues as well as gait for short household distance. Pt continues to require dense cues for sequencing with gait, especially in distracting environment (when pt carrying on conversation while ambulating, gait quality/safety is decreased), esp for RW management/proximity. Pt motivated by his progress and is appreciative of this therapist support, he seems to do better with consistent care team and expressed a lot of concern for his memory lapses/difficulty recalling events of prior sessions or names of care team, OT team notified he wants to work on this in sessions but will need reading glasses in order to read or take notes. Patient will benefit from continued inpatient follow up therapy, <3 hours/day, pt participating much more now and very motivated, with improved pain control/standing tolerance when premedicated.    If plan is discharge home, recommend the following: Assist for transportation;Assistance with cooking/housework;Help with stairs or ramp for entrance;A lot of help with walking and/or transfers;A lot of help with bathing/dressing/bathroom   Can travel by private vehicle      (may do better with wheelchair fleeta or ambulance depending on pain)  Equipment Recommendations  Wheelchair (measurements PT);Wheelchair cushion (measurements PT);BSC/3in1 (RW vs WC pending  progress)    Recommendations for Other Services OT consult (pt requesting to work on memory strategies)     Precautions / Restrictions Precautions Precautions: Fall Recall of Precautions/Restrictions: Impaired Precaution/Restrictions Comments: poor recall of safety precs but improving gradually Restrictions Weight Bearing Restrictions Per Provider Order: Yes LLE Weight Bearing Per Provider Order: Weight bearing as tolerated     Mobility  Bed Mobility Overal bed mobility: Needs Assistance Bed Mobility: Supine to Sit, Sit to Supine     Supine to sit: Min assist Sit to supine: HOB elevated, Used rails, Contact guard assist   General bed mobility comments: HHA to raise trunk from flat bed to long sitting. CGA for sit>supine with reliance on bed rail support    Transfers Overall transfer level: Needs assistance Equipment used: Rolling walker (2 wheels) Transfers: Sit to/from Stand Sit to Stand: Min assist, From elevated surface           General transfer comment: elevated BSC >RW and RW>EOB (holding rail) with minA. increased time to perform, cues for safety needed each transfer, poor recall of safe UE placement.    Ambulation/Gait Ambulation/Gait assistance: Min assist, +2 safety/equipment Gait Distance (Feet): 45 Feet (70ft x2 with standing break) Assistive device: Rolling walker (2 wheels) Gait Pattern/deviations: Step-to pattern, Decreased step length - right, Antalgic, Decreased weight shift to left, Decreased dorsiflexion - left, Step-through pattern, Trunk flexed Gait velocity: reduced     General Gait Details: Increased time/effort  and up to minA for RW management/stability and no buckling this date. Pt with decreased RW management/safety while distracted by conversation with NT when she arrived to the room. Benefits from repetitive cues for step sequencing/RW placement, consistent minA needed  to maintain safe pattern.   Stairs             Wheelchair  Mobility     Tilt Bed    Modified Rankin (Stroke Patients Only)       Balance Overall balance assessment: Needs assistance Sitting-balance support: No upper extremity supported, Feet supported Sitting balance-Leahy Scale: Good     Standing balance support: Reliant on assistive device for balance Standing balance-Leahy Scale: Poor Standing balance comment: heavy UE reliance on RW                            Communication Communication Communication: No apparent difficulties  Cognition Arousal: Alert Behavior During Therapy: WFL for tasks assessed/performed   PT - Cognitive impairments: Sequencing, Safety/Judgement, Memory                       PT - Cognition Comments: Pt expressed frustration as he is realizing his memory is worse than it used to be, but pt with improved motivation this date to progress mobility, is appreciative of therapist assist, and remembers working with this therapist previous date, but seemed frustrated he can't recall PTA's name or NT's name despite being told the name within the past few minutes. OT notified pt asking about working more on memory strategies in sessions, or could consider SLP cog eval/work-up if that would be better. Decreased safety while attempting dual tasking. Following commands: Intact Following commands impaired: Only follows one step commands consistently, Follows one step commands with increased time    Cueing Cueing Techniques: Verbal cues, Gestural cues, Tactile cues  Exercises Other Exercises Other Exercises: pt defers seated exercises in chair post-exertion due to pain, plan to review again next session. He does not have reading glasses in room so can't read HEP.    General Comments General comments (skin integrity, edema, etc.): Pt dependent for posterior hygiene assist, NT in room to assist with this during session for pt safety. No acute s/sx distress other than mild to moderate c/o pain.       Pertinent Vitals/Pain Pain Assessment Pain Assessment: Faces Faces Pain Scale: Hurts little more Pain Location: L hip Pain Descriptors / Indicators: Sore, Guarding Pain Intervention(s): Monitored during session, Premedicated before session, Repositioned    Home Living                          Prior Function            PT Goals (current goals can now be found in the care plan section) Acute Rehab PT Goals Patient Stated Goal: get stronger, work on improving my memory, and have less pain PT Goal Formulation: With patient Time For Goal Achievement: 04/12/24 Progress towards PT goals: Progressing toward goals    Frequency    Min 2X/week      PT Plan      Co-evaluation              AM-PAC PT 6 Clicks Mobility   Outcome Measure  Help needed turning from your back to your side while in a flat bed without using bedrails?: A Little Help needed moving from lying on your back to sitting on the side of a flat bed without using bedrails?: A Lot (w/o rail to EOB) Help needed moving to and from a bed to a chair (including a wheelchair)?: A Little Help needed standing up from a chair  using your arms (e.g., wheelchair or bedside chair)?: A Little Help needed to walk in hospital room?: A Lot (+2 safety) Help needed climbing 3-5 steps with a railing? : Total 6 Click Score: 14    End of Session Equipment Utilized During Treatment: Gait belt Activity Tolerance: Patient tolerated treatment well;Patient limited by pain Patient left: with call bell/phone within reach;Other (comment);in bed;with bed alarm set (HOB >40 deg since lunch is likely to arrive soon) Nurse Communication: Mobility status;Other (comment) (needs a clock for his room, it was supposedly ordered on tues 1/27 (RN visual merchandiser)) PT Visit Diagnosis: Unsteadiness on feet (R26.81);Pain;Other abnormalities of gait and mobility (R26.89);Muscle weakness (generalized) (M62.81) Pain - Right/Left:  Left Pain - part of body: Hip     Time: 1208-1225 PT Time Calculation (min) (ACUTE ONLY): 17 min  Charges:    $Gait Training: 8-22 mins PT General Charges $$ ACUTE PT VISIT: 1 Visit                     Elizabethann Lackey P., PTA Acute Rehabilitation Services Secure Chat Preferred 9a-5:30pm Office: [phone]    Connell HERO [facility] 04/07/2024, 1:46 PM

## 2024-04-07 NOTE — Progress Notes (Shared)
 MD notified that patient had low fsbs last pm and held am lantus  insulin . MD stated she would change dose.

## 2024-04-07 NOTE — TOC Progression Note (Signed)
 Transition of Care [facility]) - Progression Note    Patient Details  Name: David Wyatt MRN: [id number] Date of Birth: 08/17/1960  [facility]) CM/SW Contact  Bridget Cordella Simmonds, LCSW Phone Number: 04/07/2024, 3:20 PM  Clinical Narrative:   Pt spoke with [name][facility].  She informed him he would need to give them his disability check, would get $70/month spending money.    CSW spoke with pt after this, he reports he cannot give up his whole check, he has storage unit that costs more than $70 month.    CSW attempted to call [facility] about potential return, phone not working today.   CSW updated ICM supervisor Josefa Jes of pt decline to placement offer.   Expected Discharge Plan: Skilled Nursing Facility Barriers to Discharge: Continued Medical Work up, SNF Pending bed offer               Expected Discharge Plan and Services In-house Referral: Clinical Social Work   Post Acute Care Choice: Skilled Nursing Facility Living arrangements for the past 2 months: Homeless Shelter, Homeless ([employer] and on the street) Expected Discharge Date: 03/16/24                                     Social Drivers of Health (SDOH) Interventions SDOH Screenings   Food Insecurity: Food Insecurity Present (03/29/2024)  Housing: High Risk (03/15/2024)  Transportation Needs: Unmet Transportation Needs (03/15/2024)  Utilities: At Risk (03/15/2024)  Alcohol Screen: Low Risk (12/07/2023)  Depression (PHQ2-9): Medium Risk (12/06/2023)  Financial Resource Strain: High Risk (01/15/2024)  Social Connections: Unknown (03/15/2024)  Tobacco Use: Medium Risk (03/14/2024)    Readmission Risk Interventions     No data to display

## 2024-04-07 NOTE — Progress Notes (Signed)
 " PROGRESS NOTE  David Wyatt FMW:[id number] DOB: 1960-11-07 DOA: 03/13/2024 PCP: David Clam, MD   LOS: 25 days   Brief narrative: Patient is a 64 year old male with past medical history of diabetes mellitus, hypertension, hyperlipidemia, cocaine  abuse, diabetic neuropathy, chronic back pain, arthritis presented to hospital with fall and left hip pain.  X-ray of the hip showed left-sided hip fracture.  Orthopedics was consulted and patient underwent closed reduction and IM fixation during hospitalization. Currently awaiting SNF placement   Assessment/Plan: Principal Problem:   Femoral fracture (HCC)  Left hip intertrochanteric femur fracture Status post closed reduction and IM fixation left hip 03/14/2024 , had significant postoperative pain so repeat x-ray were done without any complication.  Orthopedics recommended ambulation, follow-up in the outpatient office in 6 weeks.  David Wyatt OT continuing while in the hospital awaiting for skilled facility.  Persisting left leg pain  Likely neuropathic in nature.  Currently on duloxetine  and Neurontin .  Lyrica  has been added to 100 mg twice a day and dose of Neurontin  was decreased.[name]  MRI of the lumbar spine did not show any acute findings but bilateral L4 foraminal narrowing at L4.  Venous Doppler ultrasound of the lower extremities without DVT.  Continue muscle relaxant, heating pad.   Acute urinary retention Resolved.  Briefly needed Foley catheter.  Currently able to urinate on his own.  Uncontrolled DM2 with hyperglycemia and peripheral neuropathy Latest hemoglobin A1c 11.8 ,will continue with long-acting insulin , sliding scale insulin  Accu-Cheks   Essential hypertension BP stable   Depression Continue duloxetine    Normocytic anemia No recent labs   History of cocaine  abuse Reinforced quitting   Hyperlipidemia Continue atorvastatin    Tobacco abuse Reinforced quitting.  Continue nicotine  patch.   Severe constipation.  Resolved.   Will continue with bowel regimen.  DVT prophylaxis: SCDs Start: 03/13/24 2315   Disposition: Skilled nursing facility as per David Wyatt evaluation.  Medically stable for disposition  Status is: Inpatient Remains inpatient appropriate because: Need for rehabilitation,     Code Status:     Code Status: Full Code  Family Communication: None at bedside  Consultants: Orthopedics  Procedures: Closed reduction and internal fixation with IM nail of left hip on 03/14/2024  Anti-infectives:  None  Anti-infectives (From admission, onward)    None      Subjective: Today, David Wyatt reported some soreness in his buttock area, no sore/ulcer noted.   Objective: Vitals:   04/07/24 1121 04/07/24 1415  BP: (!) 146/80 125/74  Pulse: 85   Resp:  18  Temp:  98.9 F (37.2 C)  SpO2: 96%     Intake/Output Summary (Last 24 hours) at 04/07/2024 1636 Last data filed at 04/06/2024 1950 Gross per 24 hour  Intake 237 ml  Output 500 ml  Net -263 ml   Filed Weights   03/15/24 1500  Weight: 68 kg   Body mass index is 22.79 kg/m.   Physical Exam: General: NAD  Cardiovascular: S1, S2 present Respiratory: CTAB Abdomen: Soft, nontender, nondistended, bowel sounds present Musculoskeletal: No bilateral pedal edema noted Skin: Normal Psychiatry: Normal mood   Data Review: I have personally reviewed the following laboratory data and studies,  CBC: No results for input(s): WBC, NEUTROABS, HGB, HCT, MCV, PLT in the last 168 hours.  Basic Metabolic Panel: No results for input(s): NA, K, CL, CO2, GLUCOSE, BUN, CREATININE, CALCIUM , MG, PHOS in the last 168 hours.  Liver Function Tests: No results for input(s): AST, ALT, ALKPHOS, BILITOT, PROT, ALBUMIN in the last 168 hours.  No results for input(s): LIPASE, AMYLASE in the last 168 hours. No results for input(s): AMMONIA in the last 168 hours. Cardiac Enzymes: No results for input(s): CKTOTAL,  CKMB, CKMBINDEX, TROPONINI in the last 168 hours. BNP (last 3 results) No results for input(s): BNP in the last 8760 hours.  ProBNP (last 3 results) No results for input(s): PROBNP in the last 8760 hours.  CBG: Recent Labs  Lab 04/06/24 1931 04/06/24 2005 04/07/24 0618 04/07/24 0758 04/07/24 1118  GLUCAP 60* 140* 91 109* 174*   No results found for this or any previous visit (from the past 240 hours).   Studies: No results found.     David [name], MD  [facility] 04/07/2024  If 7PM-7AM, please contact night-coverage       "

## 2024-04-07 NOTE — Plan of Care (Signed)
 Pain decreased at time and patient increasing mobility

## 2024-04-08 DIAGNOSIS — S72142G Displaced intertrochanteric fracture of left femur, subsequent encounter for closed fracture with delayed healing: Secondary | ICD-10-CM | POA: Diagnosis not present

## 2024-04-08 LAB — GLUCOSE, CAPILLARY
Glucose-Capillary: 161 mg/dL — ABNORMAL HIGH (ref 70–99)
Glucose-Capillary: 129 mg/dL — ABNORMAL HIGH (ref 70–99)
Glucose-Capillary: 177 mg/dL — ABNORMAL HIGH (ref 70–99)
Glucose-Capillary: 112 mg/dL — ABNORMAL HIGH (ref 70–99)

## 2024-04-08 NOTE — Progress Notes (Signed)
 " PROGRESS NOTE  David Wyatt FMW:[id number] DOB: 22-Jan-1961 DOA: 03/13/2024 PCP: Delbert Clam, MD   LOS: 26 days   Brief narrative: Patient is a 64 year old male with past medical history of diabetes mellitus, hypertension, hyperlipidemia, cocaine  abuse, diabetic neuropathy, chronic back pain, arthritis presented to hospital with fall and left hip pain.  X-ray of the hip showed left-sided hip fracture.  Orthopedics was consulted and patient underwent closed reduction and IM fixation during hospitalization. Currently awaiting SNF placement   Assessment/Plan: Principal Problem:   Femoral fracture (HCC)  Left hip intertrochanteric femur fracture Status post closed reduction and IM fixation left hip 03/14/2024 , had significant postoperative pain so repeat x-ray were done without any complication.  Orthopedics recommended ambulation, follow-up in the outpatient office in 6 weeks.  PT OT continuing while in the hospital awaiting for skilled facility.  Persisting left leg pain Likely neuropathic in nature.  Currently on duloxetine  and Neurontin .  Lyrica  has been added to 100 mg twice a day and dose of Neurontin  was decreased.[name]  MRI of the lumbar spine did not show any acute findings but bilateral L4 foraminal narrowing at L4.  Venous Doppler ultrasound of the lower extremities without DVT.  Continue muscle relaxant, heating pad.   Acute urinary retention Resolved.  Briefly needed Foley catheter.  Currently able to urinate on his own.  Uncontrolled DM2 with hyperglycemia and peripheral neuropathy Latest hemoglobin A1c 11.8 ,will continue with long-acting insulin , sliding scale insulin  Accu-Cheks   Essential hypertension BP stable   Depression Continue duloxetine    Normocytic anemia No recent labs   History of cocaine  abuse Reinforced quitting   Hyperlipidemia Continue atorvastatin    Tobacco abuse Reinforced quitting.  Continue nicotine  patch.   Severe constipation.  Resolved.  Will  continue with bowel regimen.  DVT prophylaxis: SCDs Start: 03/13/24 2315   Disposition: Skilled nursing facility as per PT evaluation.  Medically stable for disposition  Status is: Inpatient Remains inpatient appropriate because: Need for rehabilitation,     Code Status:     Code Status: Full Code  Family Communication: None at bedside  Consultants: Orthopedics  Procedures: Closed reduction and internal fixation with IM nail of left hip on 03/14/2024  Anti-infectives:  None  Anti-infectives (From admission, onward)    None      Subjective: Today, pt significantly upset as he was told by the social worker that he may have to be discharged back to the streets after refusing an option of SNF in [place] (SNF was going to take almost all of his disability check for 3 months, patient refused).  Due to patient's recent surgery, appears to be a fall risk, only able to ambulate 50 to 100 feet.  Will be an unsafe discharge, if he has to be discharged back to the streets.   Objective: Vitals:   04/08/24 0327 04/08/24 0731  BP: 126/77 119/74  Pulse: 75 78  Resp: 16 18  Temp: 98.5 F (36.9 C) 98.4 F (36.9 C)  SpO2: 96% 95%    Intake/Output Summary (Last 24 hours) at 04/08/2024 1458 Last data filed at 04/08/2024 0317 Gross per 24 hour  Intake 360 ml  Output 1425 ml  Net -1065 ml   Filed Weights   03/15/24 1500  Weight: 68 kg   Body mass index is 22.79 kg/m.   Physical Exam: General: NAD  Cardiovascular: S1, S2 present Respiratory: CTAB Abdomen: Soft, nontender, nondistended, bowel sounds present Musculoskeletal: No bilateral pedal edema noted Skin: Normal Psychiatry: Normal mood  Data Review: I have personally reviewed the following laboratory data and studies,  CBC: No results for input(s): WBC, NEUTROABS, HGB, HCT, MCV, PLT in the last 168 hours.  Basic Metabolic Panel: No results for input(s): NA, K, CL, CO2, GLUCOSE, BUN,  CREATININE, CALCIUM , MG, PHOS in the last 168 hours.  Liver Function Tests: No results for input(s): AST, ALT, ALKPHOS, BILITOT, PROT, ALBUMIN in the last 168 hours. No results for input(s): LIPASE, AMYLASE in the last 168 hours. No results for input(s): AMMONIA in the last 168 hours. Cardiac Enzymes: No results for input(s): CKTOTAL, CKMB, CKMBINDEX, TROPONINI in the last 168 hours. BNP (last 3 results) No results for input(s): BNP in the last 8760 hours.  ProBNP (last 3 results) No results for input(s): PROBNP in the last 8760 hours.  CBG: Recent Labs  Lab 04/07/24 1118 04/07/24 1635 04/07/24 2049 04/08/24 0612 04/08/24 1150  GLUCAP 174* 136* 154* 161* 129*   No results found for this or any previous visit (from the past 240 hours).   Studies: No results found.     [name], MD  [facility] 04/08/2024  If 7PM-7AM, please contact night-coverage       "

## 2024-04-08 NOTE — Progress Notes (Signed)
 Occupational Therapy Treatment Patient Details Name: David Wyatt MRN: [id number] DOB: 1960-06-07 Today's Date: 04/08/2024   History of present illness 64 y.o. male presents 03/13/24 with L hip pain. Imaging shows displaced intertrochanteric fracture on the left. S/p IM rod L hip 1/4. PMH: DM2 with neuropathy, HTN, HLD, substance use, chronic back pain, arthritis.   OT comments  Pt grateful for reading glasses provided by OT. Able to see small print. Pt with concerns about his poor memory, reports this is not new and frequently loses items and forgets appointments. Educated in compensatory strategies. Pt donned gown at bed level with set up. Came to EOB with min assist. Completed grooming in sitting at EOB. Pt stood from elevated bed and ambulated short distance in room with min assist and RW. Pt reports he is going to rehab somewhere in the next day or two. Patient will benefit from continued inpatient follow up therapy, <3 hours/day.      If plan is discharge home, recommend the following:  Assistance with cooking/housework;Assist for transportation;A lot of help with bathing/dressing/bathroom;A little help with walking and/or transfers   Equipment Recommendations  BSC/3in1;Tub/shower bench    Recommendations for Other Services      Precautions / Restrictions Precautions Precautions: Fall Recall of Precautions/Restrictions: Impaired Restrictions Weight Bearing Restrictions Per Provider Order: Yes LLE Weight Bearing Per Provider Order: Weight bearing as tolerated       Mobility Bed Mobility Overal bed mobility: Needs Assistance Bed Mobility: Supine to Sit, Sit to Supine     Supine to sit: Min assist Sit to supine: Supervision   General bed mobility comments: hand held to raise trunk, HOB nearly flat, no assist to return to supine    Transfers     Transfers: Sit to/from Stand Sit to Stand: Min assist, From elevated surface           General transfer comment:  increased time, cues for hand placement, assist to rise and steady     Balance Overall balance assessment: Needs assistance   Sitting balance-Leahy Scale: Good       Standing balance-Leahy Scale: Poor Standing balance comment: reliant on RW                           ADL either performed or assessed with clinical judgement   ADL Overall ADL's : Needs assistance/impaired     Grooming: Wash/dry hands;Wash/dry face;Oral care;Sitting;Set up           Upper Body Dressing : Set up;Bed level Upper Body Dressing Details (indicate cue type and reason): donned gown prior to sitting up at EOB Lower Body Dressing: Moderate assistance Lower Body Dressing Details (indicate cue type and reason): assist for L sock             Functional mobility during ADLs: Minimal assistance;Rolling walker (2 wheels)      Extremity/Trunk Assessment              Vision       Perception     Praxis     Communication Communication Communication: No apparent difficulties   Cognition Arousal: Alert Behavior During Therapy: WFL for tasks assessed/performed Cognition: Cognition impaired     Awareness: Online awareness impaired, Intellectual awareness impaired Memory impairment (select all impairments): Short-term memory Attention impairment (select first level of impairment): Selective attention Executive functioning impairment (select all impairments): Initiation, Problem solving OT - Cognition Comments: pt aware of his poor memory, educated in compensatory  strategies of list making, calendar use, cell phone use, updated pt's white board                 Following commands: Intact Following commands impaired: Only follows one step commands consistently, Follows one step commands with increased time      Cueing   Cueing Techniques: Verbal cues, Gestural cues, Tactile cues  Exercises      Shoulder Instructions       General Comments      Pertinent Vitals/  Pain       Pain Assessment Pain Assessment: Faces Faces Pain Scale: Hurts little more Pain Location: L hip Pain Descriptors / Indicators: Sore, Guarding Pain Intervention(s): Premedicated before session, Monitored during session  Home Living                                          Prior Functioning/Environment              Frequency  Min 2X/week        Progress Toward Goals  OT Goals(current goals can now be found in the care plan section)  Progress towards OT goals: Progressing toward goals  Acute Rehab OT Goals OT Goal Formulation: With patient Time For Goal Achievement: 04/20/24 Potential to Achieve Goals: Good  Plan      Co-evaluation                 AM-PAC OT 6 Clicks Daily Activity     Outcome Measure   Help from another person eating meals?: None Help from another person taking care of personal grooming?: A Little Help from another person toileting, which includes using toliet, bedpan, or urinal?: A Little Help from another person bathing (including washing, rinsing, drying)?: A Little Help from another person to put on and taking off regular upper body clothing?: None Help from another person to put on and taking off regular lower body clothing?: A Little 6 Click Score: 20    End of Session Equipment Utilized During Treatment: Gait belt;Rolling walker (2 wheels)  OT Visit Diagnosis: Unsteadiness on feet (R26.81);Muscle weakness (generalized) (M62.81);History of falling (Z91.81);Pain;Other symptoms and signs involving cognitive function Pain - Right/Left: Left Pain - part of body: Hip   Activity Tolerance Patient tolerated treatment well   Patient Left in bed;with call bell/phone within reach;with bed alarm set   Nurse Communication          Time: 1510-1530 OT Time Calculation (min): 20 min  Charges: OT General Charges $OT Visit: 1 Visit OT Treatments $Self Care/Home Management : 8-22 mins  [name],  OTR/L Acute Rehabilitation Services Office: [phone]   [name] 04/08/2024, 4:24 PM

## 2024-04-08 NOTE — TOC Progression Note (Addendum)
 Transition of Care [facility]) - Progression Note    Patient Details  Name: David Wyatt MRN: [id number] Date of Birth: 05-08-1960  [facility]) CM/SW Contact  Bridget Cordella Simmonds, LCSW Phone Number: 04/08/2024, 8:35 AM  Clinical Narrative:   CSW spoke with front desk at [employer], after consulting with someone, the person on the phone stated pt cannot return to [employer], per [employer].  CSW informed Cathlyn Das not currently there, left message for him.   CSW spoke with [facility], [phone], pt tailered case mgr from [facility] in [place]  She was not aware pt was here until yesterday, has had hard time staying in touch with him due his homelessness.  Discussed pt stay at [facility], efforts to find STR and pt declining SNF option in [place] yesterday. Discussed impending DC based on this refusal.  She has made referral to [facility] transition to community living program, [place], [place] [phone].  They need to complete an assessment for their program.    0945: TC Darleene Sheldon, [name].  She does not need face to face eval but could use some records from MD/OT/PT and anything addressing cognition.  [email or web address].  She is aware of DC today or soon.  CSW spoke with pt, discussed that with his refusal to go to SNF in [place], possible DC today.  CSW updated him on the above, permission given for CSW to send notes to Cory with [employer].    1015: notes emailed to Cory.  1500: CSW and [name] spoke with pt together.  [facility] is actually near [place], not hear [place].  [name] spoke with pt, he was more open to option in [place]. We discussed financial arrangement would be the same, still would have to give up his check.  Pt had lots of questions about how often he could leave the shelter to go out and [name] explained--could not do this frequently, just occasionally.  Pt stating he will agree to this placement, he  will talk with his sister about potentially helping with his storage until while he is there.  [name] will start process on her end.   Expected Discharge Plan: Skilled Nursing Facility Barriers to Discharge: Continued Medical Work up, SNF Pending bed offer               Expected Discharge Plan and Services In-house Referral: Clinical Social Work   Post Acute Care Choice: Skilled Nursing Facility Living arrangements for the past 2 months: Homeless Shelter, Homeless ([employer] and on the street) Expected Discharge Date: 03/16/24                                     Social Drivers of Health (SDOH) Interventions SDOH Screenings   Food Insecurity: Food Insecurity Present (03/29/2024)  Housing: High Risk (03/15/2024)  Transportation Needs: Unmet Transportation Needs (03/15/2024)  Utilities: At Risk (03/15/2024)  Alcohol Screen: Low Risk (12/07/2023)  Depression (PHQ2-9): Medium Risk (12/06/2023)  Financial Resource Strain: High Risk (01/15/2024)  Social Connections: Unknown (03/15/2024)  Tobacco Use: Medium Risk (03/14/2024)    Readmission Risk Interventions     No data to display

## 2024-04-08 NOTE — Progress Notes (Signed)
 Mobility Specialist Progress Note:    04/08/24 1010  Mobility  Activity Ambulated with assistance (In hallway)  Level of Assistance Minimal assist, patient does 75% or more (+ chair follow)  Assistive Device Front wheel walker  Distance Ambulated (ft) 75 ft  LLE Weight Bearing Per Provider Order WBAT  Activity Response Tolerated well  Mobility Referral Yes  Mobility visit 1 Mobility  Mobility Specialist Start Time (ACUTE ONLY) 0950  Mobility Specialist Stop Time (ACUTE ONLY) 1010  Mobility Specialist Time Calculation (min) (ACUTE ONLY) 20 min   Received pt in bed and hesitant but agreeable after encouragement. Pt required MinA STS and chair follow for safety. Pt c/o severe LLE pain. Returned to room without fault. Pt agreeable to sitting in chair for 30 minutes. Left in chair with alarm on. Personal belongings and call light within reach. All needs met.  Lavanda Pollack Mobility Specialist  Please contact via [employer] or  Rehab Office [phone]

## 2024-04-09 ENCOUNTER — Encounter: Payer: Self-pay | Admitting: Gastroenterology

## 2024-04-09 DIAGNOSIS — S72142G Displaced intertrochanteric fracture of left femur, subsequent encounter for closed fracture with delayed healing: Secondary | ICD-10-CM | POA: Diagnosis not present

## 2024-04-09 LAB — GLUCOSE, CAPILLARY
Glucose-Capillary: 208 mg/dL — ABNORMAL HIGH (ref 70–99)
Glucose-Capillary: 212 mg/dL — ABNORMAL HIGH (ref 70–99)
Glucose-Capillary: 125 mg/dL — ABNORMAL HIGH (ref 70–99)
Glucose-Capillary: 186 mg/dL — ABNORMAL HIGH (ref 70–99)

## 2024-04-09 NOTE — TOC Progression Note (Signed)
 Transition of Care [facility]) - Progression Note    Patient Details  Name: David Wyatt MRN: [id number] Date of Birth: 16-Jan-1961  Transition of Care [facility]) CM/SW Contact  Bridget Cordella Simmonds, LCSW Phone Number: 04/09/2024, 12:09 PM  Clinical Narrative:   Message sent to [name] for update on SNF bed availability.     Expected Discharge Plan: Skilled Nursing Facility Barriers to Discharge: Continued Medical Work up, SNF Pending bed offer               Expected Discharge Plan and Services In-house Referral: Clinical Social Work   Post Acute Care Choice: Skilled Nursing Facility Living arrangements for the past 2 months: Homeless Shelter, Homeless ([employer] and on the street) Expected Discharge Date: 03/16/24                                     Social Drivers of Health (SDOH) Interventions SDOH Screenings   Food Insecurity: Food Insecurity Present (03/29/2024)  Housing: High Risk (03/15/2024)  Transportation Needs: Unmet Transportation Needs (03/15/2024)  Utilities: At Risk (03/15/2024)  Alcohol Screen: Low Risk (12/07/2023)  Depression (PHQ2-9): Medium Risk (12/06/2023)  Financial Resource Strain: High Risk (01/15/2024)  Social Connections: Unknown (03/15/2024)  Tobacco Use: Medium Risk (03/14/2024)    Readmission Risk Interventions     No data to display

## 2024-04-09 NOTE — Progress Notes (Signed)
 Physical Therapy Treatment Patient Details Name: David Wyatt MRN: [id number] DOB: [birth year] Today's Date: 04/09/2024   History of Present Illness 64 y.o. male presents 03/13/24 with L hip pain. Imaging shows displaced intertrochanteric fracture on the left. S/p IM rod L hip 1/4. PMH: DM2 with neuropathy, HTN, HLD, substance use, chronic back pain, arthritis.    PT Comments  Pt received in supine, agreeable to therapy session with encouragement after premedication for pain ~2 hours prior (therapist could not get back to room initially after meds given). Pt with good participation after initial discussion of plan and pt expressed frustration with difficulty moving LLE at times due to neuropathy. Pt encouraged to continue supine/seated LE ROM and use gait belt PRN as leg lifter. Pt needing up to minA +2 for transfers/gait due to difficulty sequencing and poor RW management when turning and when fatigued. Pt unable to stand from EOB without lift assist this date, he attempted with only CGA but not able to rise fully without minA +1-2. Pt agreeable to sit up in chair with max encouragement and given pencil and paper per his request for daily journal to try working on memory strategies. Patient will benefit from continued inpatient follow up therapy, <3 hours/day.     If plan is discharge home, recommend the following: Assist for transportation;Assistance with cooking/housework;Help with stairs or ramp for entrance;A lot of help with walking and/or transfers;A lot of help with bathing/dressing/bathroom   Can travel by private vehicle     No  Equipment Recommendations  Wheelchair (measurements PT);Wheelchair cushion (measurements PT);BSC/3in1    Recommendations for Other Services       Precautions / Restrictions Precautions Precautions: Fall Recall of Precautions/Restrictions: Impaired Precaution/Restrictions Comments: pt has papers to write down what he does in therapy so he can remember,  encourage him to use it Restrictions Weight Bearing Restrictions Per Provider Order: Yes LLE Weight Bearing Per Provider Order: Weight bearing as tolerated     Mobility  Bed Mobility Overal bed mobility: Needs Assistance Bed Mobility: Supine to Sit     Supine to sit: Contact guard, Used rails     General bed mobility comments: increased time/effort to perform    Transfers Overall transfer level: Needs assistance Equipment used: Rolling walker (2 wheels) Transfers: Sit to/from Stand Sit to Stand: Min assist, +2 physical assistance           General transfer comment: Pt attempting to perform unassisted from lower bed height initially, but became frustrated by inability. Pt encouraged to reposition to reattempt, and needing minA +2 for safety/stabiltiy to rise (RN present to assist). Decreased safety with stand>sit to recliner, pt attempts to sit prior to reaching proximity to chair, mod safety cues needed.    Ambulation/Gait Ambulation/Gait assistance: Min assist, +2 safety/equipment (progressed to +1 only assist) Gait Distance (Feet): 50 Feet Assistive device: Rolling walker (2 wheels) Gait Pattern/deviations: Step-to pattern, Decreased step length - right, Antalgic, Decreased weight shift to left, Decreased dorsiflexion - left, Step-through pattern, Trunk flexed       General Gait Details: Increased time/effort  and up to minA for RW management/stability. Pt with decreased RW management/safety while distracted by conversation about his worries, pt needs redirection to task and RW management assist when turning and taking backward steps toward chair. Repetitive cues for step sequencing/safety needed at times. Progressed from +2 safety to +1 assist, but heavy minA with backward stepping to chair when fatigued due to increased trunk flexion and unsafe body mechanics.   Stairs  Wheelchair Mobility     Tilt Bed    Modified Rankin (Stroke Patients Only)        Balance Overall balance assessment: Needs assistance Sitting-balance support: No upper extremity supported, Feet supported Sitting balance-Leahy Scale: Good     Standing balance support: Bilateral upper extremity supported, Reliant on assistive device for balance Standing balance-Leahy Scale: Poor Standing balance comment: reliant on RW                            Communication Communication Communication: No apparent difficulties  Cognition Arousal: Alert Behavior During Therapy: WFL for tasks assessed/performed (irritable but cooperative)   PT - Cognitive impairments: Sequencing, Safety/Judgement, Memory, Problem solving                       PT - Cognition Comments: Decreased safety with gait when carrying on conversation, difficulty dual tasking. Following commands: Intact Following commands impaired: Only follows one step commands consistently, Follows one step commands with increased time    Cueing Cueing Techniques: Verbal cues, Gestural cues, Tactile cues  Exercises Other Exercises Other Exercises: seated BLE AROM: LAQ x 5 reps ea Other Exercises: IS x 5 reps ea, pt achieves [id number] mL, encouraged hourly use x10 reps    General Comments General comments (skin integrity, edema, etc.): Pt given papers to write on to record his daily therapies and name of PTA and his RN. Pt reporting his R arm is painful after using RW and pt encouraged to take breaks/take his time to write it down. Pt provided with pencil to write with. RN notified pt up in chair (alarm on for safety) and encouraged to sit up 1-2 hours if possible to build tolerance for OOB activity.      Pertinent Vitals/Pain Pain Assessment Pain Assessment: Faces Faces Pain Scale: Hurts even more Pain Location: L hip Pain Descriptors / Indicators: Sore, Guarding, Grimacing Pain Intervention(s): Limited activity within patient's tolerance, Monitored during session, Premedicated before  session, Repositioned    Home Living                          Prior Function            PT Goals (current goals can now be found in the care plan section) Acute Rehab PT Goals Patient Stated Goal: get stronger, work on improving my memory, and have less pain PT Goal Formulation: With patient Time For Goal Achievement: 04/12/24 Progress towards PT goals: Progressing toward goals    Frequency    Min 2X/week      PT Plan      Co-evaluation              AM-PAC PT 6 Clicks Mobility   Outcome Measure  Help needed turning from your back to your side while in a flat bed without using bedrails?: A Little Help needed moving from lying on your back to sitting on the side of a flat bed without using bedrails?: A Lot (w/o rail) Help needed moving to and from a bed to a chair (including a wheelchair)?: A Little Help needed standing up from a chair using your arms (e.g., wheelchair or bedside chair)?: A Little Help needed to walk in hospital room?: A Lot Help needed climbing 3-5 steps with a railing? : Total 6 Click Score: 14    End of Session Equipment Utilized During Treatment: Gait belt Activity Tolerance:  Patient tolerated treatment well;Patient limited by pain Patient left: in chair;with call bell/phone within reach;with chair alarm set Nurse Communication: Mobility status;Precautions;Other (comment) (pt needs to sit up for dinner) PT Visit Diagnosis: Unsteadiness on feet (R26.81);Pain;Other abnormalities of gait and mobility (R26.89);Muscle weakness (generalized) (M62.81) Pain - Right/Left: Left Pain - part of body: Hip     Time: 8383-8359 PT Time Calculation (min) (ACUTE ONLY): 24 min  Charges:    $Gait Training: 8-22 mins $Therapeutic Activity: 8-22 mins PT General Charges $$ ACUTE PT VISIT: 1 Visit                     Courtlyn Aki P., PTA Acute Rehabilitation Services Secure Chat Preferred 9a-5:30pm Office: [phone]    Connell HERO  [facility] 04/09/2024, 4:51 PM

## 2024-04-09 NOTE — Plan of Care (Signed)

## 2024-04-10 DIAGNOSIS — S72142G Displaced intertrochanteric fracture of left femur, subsequent encounter for closed fracture with delayed healing: Secondary | ICD-10-CM | POA: Diagnosis not present

## 2024-04-10 LAB — GLUCOSE, CAPILLARY
Glucose-Capillary: 116 mg/dL — ABNORMAL HIGH (ref 70–99)
Glucose-Capillary: 138 mg/dL — ABNORMAL HIGH (ref 70–99)
Glucose-Capillary: 145 mg/dL — ABNORMAL HIGH (ref 70–99)
Glucose-Capillary: 86 mg/dL (ref 70–99)

## 2024-04-10 NOTE — Plan of Care (Signed)

## 2024-04-10 NOTE — Progress Notes (Signed)
 " PROGRESS NOTE  David Wyatt FMW:[id number] DOB: [birth year] DOA: 03/13/2024 PCP: David Clam, MD   LOS: 28 days   Brief narrative: Patient is a 64 year old male with past medical history of diabetes mellitus, hypertension, hyperlipidemia, cocaine  abuse, diabetic neuropathy, chronic back pain, arthritis presented to hospital with fall and left hip pain.  X-ray of the hip showed left-sided hip fracture.  Orthopedics was consulted and patient underwent closed reduction and IM fixation during hospitalization. Currently awaiting SNF placement     Assessment/Plan: Principal Problem:   Femoral fracture (HCC)  Left hip intertrochanteric femur fracture Status post closed reduction and IM fixation left hip 03/14/2024 , had significant postoperative pain so repeat x-ray were done without any complication.  Orthopedics recommended ambulation, follow-up in the outpatient office in 6 weeks.  PT OT continuing while in the hospital awaiting for skilled facility.  Persisting left leg pain Likely neuropathic in nature.  Currently on duloxetine  and Neurontin .  Lyrica  has been added to 100 mg twice a day and dose of Neurontin  was decreased.David Wyatt  MRI of the lumbar spine did not show any acute findings but bilateral L4 foraminal narrowing at L4.  Venous Doppler ultrasound of the lower extremities without DVT.  Continue muscle relaxant, heating pad.   Acute urinary retention Resolved.  Briefly needed Foley catheter.  Currently able to urinate on his own.  Uncontrolled DM2 with hyperglycemia and peripheral neuropathy Latest hemoglobin A1c 11.8 ,will continue with long-acting insulin , sliding scale insulin  Accu-Cheks   Essential hypertension BP stable   Depression Continue duloxetine    Normocytic anemia No recent labs   History of cocaine  abuse Reinforced quitting   Hyperlipidemia Continue atorvastatin    Tobacco abuse Reinforced quitting.  Continue nicotine  patch.   Severe constipation.  Resolved.   Will continue with bowel regimen.  DVT prophylaxis: SCDs Start: 03/13/24 2315   Disposition: Skilled nursing facility as per PT evaluation.  Medically stable for disposition  Status is: Inpatient Remains inpatient appropriate because: Need for rehabilitation,     Code Status:     Code Status: Full Code  Family Communication: None at bedside  Consultants: Orthopedics  Procedures: Closed reduction and internal fixation with IM nail of left hip on 03/14/2024  Anti-infectives:  None  Anti-infectives (From admission, onward)    None      Subjective: Denies any new complaints   Objective: Vitals:   04/09/24 2000 04/10/24 0419  BP: 122/79 121/74  Pulse: 94 84  Resp: 16 16  Temp: 98.3 F (36.8 C) 98.5 F (36.9 C)  SpO2: 95% 94%    Intake/Output Summary (Last 24 hours) at 04/10/2024 1411 Last data filed at 04/10/2024 0400 Gross per 24 hour  Intake --  Output 1450 ml  Net -1450 ml   Filed Weights   03/15/24 1500  Weight: 68 kg   Body mass index is 22.79 kg/m.   Physical Exam: General: NAD  Cardiovascular: S1, S2 present Respiratory: CTAB Abdomen: Soft, nontender, nondistended, bowel sounds present Musculoskeletal: No bilateral pedal edema noted Skin: Normal Psychiatry: Normal mood   Data Review: I have personally reviewed the following laboratory data and studies,  CBC: No results for input(s): WBC, NEUTROABS, HGB, HCT, MCV, PLT in the last 168 hours.  Basic Metabolic Panel: No results for input(s): NA, K, CL, CO2, GLUCOSE, BUN, CREATININE, CALCIUM , MG, PHOS in the last 168 hours.  Liver Function Tests: No results for input(s): AST, ALT, ALKPHOS, BILITOT, PROT, ALBUMIN in the last 168 hours. No results for input(s): LIPASE, AMYLASE  in the last 168 hours. No results for input(s): AMMONIA in the last 168 hours. Cardiac Enzymes: No results for input(s): CKTOTAL, CKMB, CKMBINDEX, TROPONINI in  the last 168 hours. BNP (last 3 results) No results for input(s): BNP in the last 8760 hours.  ProBNP (last 3 results) No results for input(s): PROBNP in the last 8760 hours.  CBG: Recent Labs  Lab 04/09/24 1119 04/09/24 1640 04/09/24 2100 04/10/24 0556 04/10/24 1138  GLUCAP 212* 125* 186* 116* 138*   No results found for this or any previous visit (from the past 240 hours).   Studies: No results found.     David [name], MD  [facility] 04/10/2024  If 7PM-7AM, please contact night-coverage       "

## 2024-04-11 ENCOUNTER — Inpatient Hospital Stay (HOSPITAL_COMMUNITY): Payer: MEDICAID

## 2024-04-11 DIAGNOSIS — S72142G Displaced intertrochanteric fracture of left femur, subsequent encounter for closed fracture with delayed healing: Secondary | ICD-10-CM | POA: Diagnosis not present

## 2024-04-11 LAB — GLUCOSE, CAPILLARY
Glucose-Capillary: 156 mg/dL — ABNORMAL HIGH (ref 70–99)
Glucose-Capillary: 104 mg/dL — ABNORMAL HIGH (ref 70–99)
Glucose-Capillary: 144 mg/dL — ABNORMAL HIGH (ref 70–99)
Glucose-Capillary: 95 mg/dL (ref 70–99)
Glucose-Capillary: 140 mg/dL — ABNORMAL HIGH (ref 70–99)

## 2024-04-11 MED ORDER — SENNOSIDES-DOCUSATE SODIUM 8.6-50 MG PO TABS
1.0000 | ORAL_TABLET | Freq: Two times a day (BID) | ORAL | Status: DC
  Administered 2024-04-11 – 2024-04-12 (×2): 1 via ORAL
  Filled 2024-04-11 (×2): qty 1

## 2024-04-11 MED ORDER — BISACODYL 10 MG RE SUPP
10.0000 mg | Freq: Once | RECTAL | Status: AC
  Administered 2024-04-11: 10 mg via RECTAL
  Filled 2024-04-11: qty 1

## 2024-04-11 MED ORDER — POLYETHYLENE GLYCOL 3350 17 G PO PACK
17.0000 g | PACK | Freq: Two times a day (BID) | ORAL | Status: DC
  Administered 2024-04-11 – 2024-04-12 (×2): 17 g via ORAL
  Filled 2024-04-11 (×2): qty 1

## 2024-04-11 MED ORDER — LIDOCAINE 5 % EX PTCH
1.0000 | MEDICATED_PATCH | CUTANEOUS | Status: DC
  Administered 2024-04-11: 1 via TRANSDERMAL
  Filled 2024-04-11: qty 1

## 2024-04-11 NOTE — Progress Notes (Signed)
 " PROGRESS NOTE  David Wyatt FMW:[id number] DOB: Feb 27, 1961 DOA: 03/13/2024 PCP: Delbert Clam, MD   LOS: 29 days   Brief narrative: Patient is a 64 year old male with past medical history of diabetes mellitus, hypertension, hyperlipidemia, cocaine  abuse, diabetic neuropathy, chronic back pain, arthritis presented to hospital with fall and left hip pain.  X-ray of the hip showed left-sided hip fracture.  Orthopedics was consulted and patient underwent closed reduction and IM fixation during hospitalization. Currently awaiting SNF placement     Assessment/Plan: Principal Problem:   Femoral fracture (HCC)  Left hip intertrochanteric femur fracture Status post closed reduction and IM fixation left hip 03/14/2024 , had significant postoperative pain so repeat x-ray were done without any complication.  Orthopedics recommended ambulation, follow-up in the outpatient office in 6 weeks.  PT OT continuing while in the hospital awaiting for skilled facility.  Persisting left leg pain Likely neuropathic in nature.  Currently on duloxetine  and Neurontin .  Lyrica  has been added to 100 mg twice a day and dose of Neurontin  was decreased.[name]  MRI of the lumbar spine did not show any acute findings but bilateral L4 foraminal narrowing at L4.  Venous Doppler ultrasound of the lower extremities without DVT.  Continue muscle relaxant, heating pad.   Acute urinary retention Resolved.  Briefly needed Foley catheter.  Currently able to urinate on his own.  Uncontrolled DM2 with hyperglycemia and peripheral neuropathy Latest hemoglobin A1c 11.8 ,will continue with long-acting insulin , sliding scale insulin  Accu-Cheks   Essential hypertension BP stable   Depression Continue duloxetine    Normocytic anemia No recent labs   History of cocaine  abuse Reinforced quitting   Hyperlipidemia Continue atorvastatin    Tobacco abuse Reinforced quitting.  Continue nicotine  patch.   Severe constipation.  Resolved.   Will continue with bowel regimen.  DVT prophylaxis: SCDs Start: 03/13/24 2315   Disposition: Skilled nursing facility as per PT evaluation.  Medically stable for disposition  Status is: Inpatient Remains inpatient appropriate because: Need for rehabilitation,     Code Status:     Code Status: Full Code  Family Communication: None at bedside  Consultants: Orthopedics  Procedures: Closed reduction and internal fixation with IM nail of left hip on 03/14/2024  Anti-infectives:  None  Anti-infectives (From admission, onward)    None      Subjective: Still c/o L hip pain and constipation    Objective: Vitals:   04/11/24 0930 04/11/24 1438  BP: (!) 121/101 106/81  Pulse: 90 92  Resp: 16   Temp: 97.7 F (36.5 C) 97.7 F (36.5 C)  SpO2: 93% 94%    Intake/Output Summary (Last 24 hours) at 04/11/2024 1517 Last data filed at 04/11/2024 1438 Gross per 24 hour  Intake 240 ml  Output 1440 ml  Net -1200 ml   Filed Weights   03/15/24 1500  Weight: 68 kg   Body mass index is 22.79 kg/m.   Physical Exam: General: NAD  Cardiovascular: S1, S2 present Respiratory: CTAB Abdomen: Soft, nontender, nondistended, bowel sounds present Musculoskeletal: No bilateral pedal edema noted Skin: Normal Psychiatry: Normal mood   Data Review: I have personally reviewed the following laboratory data and studies,  CBC: No results for input(s): WBC, NEUTROABS, HGB, HCT, MCV, PLT in the last 168 hours.  Basic Metabolic Panel: No results for input(s): NA, K, CL, CO2, GLUCOSE, BUN, CREATININE, CALCIUM , MG, PHOS in the last 168 hours.  Liver Function Tests: No results for input(s): AST, ALT, ALKPHOS, BILITOT, PROT, ALBUMIN in the last 168 hours.  No results for input(s): LIPASE, AMYLASE in the last 168 hours. No results for input(s): AMMONIA in the last 168 hours. Cardiac Enzymes: No results for input(s): CKTOTAL, CKMB,  CKMBINDEX, TROPONINI in the last 168 hours. BNP (last 3 results) No results for input(s): BNP in the last 8760 hours.  ProBNP (last 3 results) No results for input(s): PROBNP in the last 8760 hours.  CBG: Recent Labs  Lab 04/10/24 1633 04/10/24 2049 04/11/24 0609 04/11/24 0850 04/11/24 1140  GLUCAP 145* 86 156* 104* 144*   No results found for this or any previous visit (from the past 240 hours).   Studies: No results found.     [name], MD  [facility] 04/11/2024  If 7PM-7AM, please contact night-coverage       "

## 2024-04-12 ENCOUNTER — Other Ambulatory Visit (HOSPITAL_COMMUNITY): Payer: Self-pay

## 2024-04-12 ENCOUNTER — Telehealth (HOSPITAL_COMMUNITY): Payer: Self-pay | Admitting: Pharmacy Technician

## 2024-04-12 DIAGNOSIS — S72142G Displaced intertrochanteric fracture of left femur, subsequent encounter for closed fracture with delayed healing: Secondary | ICD-10-CM | POA: Diagnosis not present

## 2024-04-12 LAB — GLUCOSE, CAPILLARY
Glucose-Capillary: 140 mg/dL — ABNORMAL HIGH (ref 70–99)
Glucose-Capillary: 138 mg/dL — ABNORMAL HIGH (ref 70–99)

## 2024-04-12 MED ORDER — DULOXETINE HCL 60 MG PO CPEP
60.0000 mg | ORAL_CAPSULE | Freq: Every evening | ORAL | 0 refills | Status: AC
  Filled 2024-04-12: qty 30, 30d supply, fill #0

## 2024-04-12 MED ORDER — AMLODIPINE BESYLATE 10 MG PO TABS
10.0000 mg | ORAL_TABLET | Freq: Every day | ORAL | 0 refills | Status: AC
  Filled 2024-04-12: qty 90, 90d supply, fill #0

## 2024-04-12 MED ORDER — GERHARDT'S BUTT CREAM
1.0000 | TOPICAL_CREAM | Freq: Every day | CUTANEOUS | 0 refills | Status: AC
  Filled 2024-04-12: qty 60, 30d supply, fill #0

## 2024-04-12 MED ORDER — PREGABALIN 100 MG PO CAPS
100.0000 mg | ORAL_CAPSULE | Freq: Two times a day (BID) | ORAL | 0 refills | Status: AC
  Filled 2024-04-12: qty 60, 30d supply, fill #0

## 2024-04-12 MED ORDER — CARVEDILOL 3.125 MG PO TABS
3.1250 mg | ORAL_TABLET | Freq: Two times a day (BID) | ORAL | 0 refills | Status: AC
  Filled 2024-04-12: qty 60, 30d supply, fill #0

## 2024-04-12 MED ORDER — TRAZODONE HCL 50 MG PO TABS
50.0000 mg | ORAL_TABLET | Freq: Every day | ORAL | 0 refills | Status: AC
  Filled 2024-04-12: qty 30, 30d supply, fill #0

## 2024-04-12 MED ORDER — ISOSORBIDE MONONITRATE ER 30 MG PO TB24
30.0000 mg | ORAL_TABLET | Freq: Every day | ORAL | 0 refills | Status: AC
  Filled 2024-04-12: qty 90, 90d supply, fill #0

## 2024-04-12 MED ORDER — ATORVASTATIN CALCIUM 40 MG PO TABS
40.0000 mg | ORAL_TABLET | Freq: Every day | ORAL | 0 refills | Status: AC
  Filled 2024-04-12: qty 90, 90d supply, fill #0

## 2024-04-12 MED ORDER — GABAPENTIN 400 MG PO CAPS
400.0000 mg | ORAL_CAPSULE | Freq: Every day | ORAL | 0 refills | Status: AC
  Filled 2024-04-12: qty 30, 30d supply, fill #0

## 2024-04-12 MED ORDER — GABAPENTIN 300 MG PO CAPS
300.0000 mg | ORAL_CAPSULE | Freq: Two times a day (BID) | ORAL | 0 refills | Status: AC
  Filled 2024-04-12: qty 60, 30d supply, fill #0

## 2024-04-12 MED ORDER — LIDOCAINE 5 % EX PTCH
1.0000 | MEDICATED_PATCH | CUTANEOUS | 0 refills | Status: AC
  Filled 2024-04-12: qty 30, 30d supply, fill #0

## 2024-04-12 MED ORDER — METHOCARBAMOL 500 MG PO TABS
500.0000 mg | ORAL_TABLET | Freq: Four times a day (QID) | ORAL | 0 refills | Status: AC | PRN
  Filled 2024-04-12: qty 30, 4d supply, fill #0

## 2024-04-12 MED ORDER — SENNOSIDES-DOCUSATE SODIUM 8.6-50 MG PO TABS
1.0000 | ORAL_TABLET | Freq: Two times a day (BID) | ORAL | 0 refills | Status: AC
  Filled 2024-04-12: qty 60, 30d supply, fill #0

## 2024-04-12 MED ORDER — TAMSULOSIN HCL 0.4 MG PO CAPS
0.4000 mg | ORAL_CAPSULE | Freq: Every day | ORAL | 0 refills | Status: AC
  Filled 2024-04-12: qty 30, 30d supply, fill #0

## 2024-04-12 MED ORDER — POLYETHYLENE GLYCOL 3350 17 GM/SCOOP PO POWD
17.0000 g | Freq: Every day | ORAL | 0 refills | Status: AC
  Filled 2024-04-12: qty 238, 14d supply, fill #0

## 2024-04-12 MED ORDER — ZOLPIDEM TARTRATE 5 MG PO TABS
5.0000 mg | ORAL_TABLET | Freq: Every day | ORAL | 0 refills | Status: AC
  Filled 2024-04-12: qty 15, 15d supply, fill #0

## 2024-04-12 MED ORDER — ASPIRIN 81 MG PO TBEC
81.0000 mg | DELAYED_RELEASE_TABLET | Freq: Two times a day (BID) | ORAL | 0 refills | Status: AC
  Filled 2024-04-12: qty 84, 42d supply, fill #0

## 2024-04-12 MED ORDER — OXYCODONE HCL 10 MG PO TABS
10.0000 mg | ORAL_TABLET | Freq: Four times a day (QID) | ORAL | 0 refills | Status: AC | PRN
  Filled 2024-04-12: qty 20, 5d supply, fill #0

## 2024-04-12 MED ORDER — ACETAMINOPHEN 500 MG PO TABS
1000.0000 mg | ORAL_TABLET | Freq: Three times a day (TID) | ORAL | 0 refills | Status: AC
  Filled 2024-04-12: qty 42, 7d supply, fill #0

## 2024-04-12 NOTE — Telephone Encounter (Signed)
 Pharmacy Patient Advocate Encounter  Received notification from [employer] [place] [employer]ICA[employer] that Prior Authorization for Lidocaine  5% Patches  has been APPROVED from 04/12/2024 to 04/12/2025   PA #/Case ID/Reference #: [id number]

## 2024-04-12 NOTE — TOC Progression Note (Addendum)
 Transition of Care [facility]) - Progression Note    Patient Details  Name: David Wyatt MRN: [id number] Date of Birth: [birth year]  Transition of Care [facility]) CM/SW Contact  Bridget Cordella Simmonds, LCSW Phone Number: 04/12/2024, 9:52 AM  Clinical Narrative:   CSW spoke with [facility], went over the paperwork they need to transfer pt social security check to the facility.  CSW brought forms to pt who now declines to sign, states he has changed his mind and is not willing to turn over his money to the facility.  [facility] and states he can figure things out from there.  1130: CSW consulted with [facility] supervisor Josefa Scinto: will confirm [facility] tonight prior to DC.  1330: TOC Director Zack Brooks confirmed [facility]: [facility], [address] [place], [place] [address].  MD informed.   1430: Somewhat lengthy discussion with pt--he will have wheelchair and walker.  Discussed [facility] not [facility] during the day, discussed warming centers have been open during the day.  Walker in the room, pt has now also been ordered a wheelchair.  Pt stating he will call someone to get a ride.  Pt asking for help getting his phone charged, placed on charger at [employer] station.    Pt continues to be alert and oriented x4.   Expected Discharge Plan: Skilled Nursing Facility Barriers to Discharge: Continued Medical Work up, SNF Pending bed offer               Expected Discharge Plan and Services In-house Referral: Clinical Social Work   Post Acute Care Choice: Skilled Nursing Facility Living arrangements for the past 2 months: Homeless Shelter, Homeless ([employer] and on [address] street) Expected Discharge Date: 03/16/24                                     Social Drivers of Health (SDOH) Interventions SDOH Screenings   Food Insecurity: Food Insecurity Present (03/29/2024)   Housing: High Risk (03/15/2024)  Transportation Needs: Unmet Transportation Needs (03/15/2024)  Utilities: At Risk (03/15/2024)  Alcohol Screen: Low Risk (12/07/2023)  Depression (PHQ2-9): Medium Risk (12/06/2023)  Financial Resource Strain: High Risk (01/15/2024)  Social Connections: Unknown (03/15/2024)  Tobacco Use: Medium Risk (03/14/2024)    Readmission Risk Interventions     No data to display

## 2024-04-12 NOTE — Progress Notes (Addendum)
 AVS and discharge instructions reviewed with patient. Patient verbalizes understanding. Patient refused to stop by [facility] pharmacy to pick up discharge medications. Patient stated that he'll get them later. This RN educated the patient on the importance of picking up and taking their medications as prescribed. This RN also explained the risks and benefits of patient not picking up medications and taking them as prescribed. Patient still refuses and was discharged.

## 2024-04-12 NOTE — TOC Transition Note (Signed)
 Transition of Care [facility]) - Discharge Note   Patient Details  Name: David Wyatt MRN: [id number] Date of Birth: [birth year]  Transition of Care [facility]) CM/SW Contact:  Bridget Cordella Simmonds, LCSW Phone Number: 04/12/2024, 3:44 PM   Clinical Narrative:   [facility], [address], [place] [address].    Final next level of care: Homeless Shelter Barriers to Discharge: Barriers Resolved   Patient Goals and CMS Choice Patient states their goals for this hospitalization and ongoing recovery are:: I don't know   Choice offered to / list presented to : Patient      Discharge Placement                  Name of family member notified: sister [name] Patient and family notified of of transfer: 04/12/24  Discharge Plan and Services Additional resources added to the After Visit Summary for   In-house Referral: Clinical Social Work   Post Acute Care Choice: Skilled Nursing Facility          DME Arranged: Wheelchair manual DME Agency: [employer] Date DME Agency Contacted: 04/12/24 Time DME Agency Contacted: 1513 Representative spoke with at DME Agency: Zachary            Social Drivers of Health (SDOH) Interventions SDOH Screenings   Food Insecurity: Food Insecurity Present (03/29/2024)  Housing: High Risk (03/15/2024)  Transportation Needs: Unmet Transportation Needs (03/15/2024)  Utilities: At Risk (03/15/2024)  Alcohol Screen: Low Risk (12/07/2023)  Depression (PHQ2-9): Medium Risk (12/06/2023)  Financial Resource Strain: High Risk (01/15/2024)  Social Connections: Unknown (03/15/2024)  Tobacco Use: Medium Risk (03/14/2024)     Readmission Risk Interventions     No data to display

## 2024-04-12 NOTE — TOC Progression Note (Signed)
 Transition of Care [facility]) - Progression Note    Patient Details  Name: David Wyatt MRN: [id number] Date of Birth: Jan 06, 1961  Transition of Care [facility]) CM/SW Contact  Rosalva Jon Bloch, RN Phone Number: 04/12/2024, 3:13 PM  Clinical Narrative:    Referral made with [employer] for DME: RW and W/C. Equipment will be delivered to bedside prior to d/c.   Expected Discharge Plan: Skilled Nursing Facility Barriers to Discharge: Continued Medical Work up, SNF Pending bed offer               Expected Discharge Plan and Services In-house Referral: Clinical Social Work   Post Acute Care Choice: Skilled Nursing Facility Living arrangements for the past 2 months: Homeless Shelter, Homeless ([employer] and on the street) Expected Discharge Date: 04/12/24               DME Arranged: Wheelchair manual DME Agency: [employer] Date DME Agency Contacted: 04/12/24 Time DME Agency Contacted: [phone] Representative spoke with at DME Agency: Zachary             Social Drivers of Health (SDOH) Interventions SDOH Screenings   Food Insecurity: Food Insecurity Present (03/29/2024)  Housing: High Risk (03/15/2024)  Transportation Needs: Unmet Transportation Needs (03/15/2024)  Utilities: At Risk (03/15/2024)  Alcohol Screen: Low Risk (12/07/2023)  Depression (PHQ2-9): Medium Risk (12/06/2023)  Financial Resource Strain: High Risk (01/15/2024)  Social Connections: Unknown (03/15/2024)  Tobacco Use: Medium Risk (03/14/2024)    Readmission Risk Interventions     No data to display

## 2024-04-13 ENCOUNTER — Emergency Department (HOSPITAL_COMMUNITY): Payer: MEDICAID

## 2024-04-13 ENCOUNTER — Other Ambulatory Visit (HOSPITAL_COMMUNITY): Payer: Self-pay

## 2024-04-13 ENCOUNTER — Emergency Department (HOSPITAL_COMMUNITY)
Admission: EM | Admit: 2024-04-13 | Discharge: 2024-04-13 | Disposition: A | Discharge status: Home / Self Care | Payer: MEDICAID | Attending: Emergency Medicine | Admitting: Emergency Medicine

## 2024-04-13 ENCOUNTER — Other Ambulatory Visit: Payer: Self-pay

## 2024-04-13 ENCOUNTER — Emergency Department (HOSPITAL_COMMUNITY)
Admission: EM | Admit: 2024-04-13 | Discharge: 2024-04-13 | Disposition: A | Discharge status: Home / Self Care | Payer: MEDICAID | Source: Home / Self Care

## 2024-04-13 ENCOUNTER — Encounter (HOSPITAL_COMMUNITY): Payer: Self-pay | Admitting: Emergency Medicine

## 2024-04-13 ENCOUNTER — Telehealth: Payer: Self-pay

## 2024-04-13 DIAGNOSIS — E1165 Type 2 diabetes mellitus with hyperglycemia: Secondary | ICD-10-CM | POA: Insufficient documentation

## 2024-04-13 DIAGNOSIS — Z794 Long term (current) use of insulin: Secondary | ICD-10-CM | POA: Insufficient documentation

## 2024-04-13 DIAGNOSIS — Z7984 Long term (current) use of oral hypoglycemic drugs: Secondary | ICD-10-CM | POA: Insufficient documentation

## 2024-04-13 DIAGNOSIS — Z79899 Other long term (current) drug therapy: Secondary | ICD-10-CM | POA: Insufficient documentation

## 2024-04-13 DIAGNOSIS — M25552 Pain in left hip: Secondary | ICD-10-CM | POA: Insufficient documentation

## 2024-04-13 DIAGNOSIS — E1140 Type 2 diabetes mellitus with diabetic neuropathy, unspecified: Secondary | ICD-10-CM | POA: Insufficient documentation

## 2024-04-13 DIAGNOSIS — M25511 Pain in right shoulder: Secondary | ICD-10-CM

## 2024-04-13 DIAGNOSIS — I10 Essential (primary) hypertension: Secondary | ICD-10-CM | POA: Insufficient documentation

## 2024-04-13 DIAGNOSIS — Z7982 Long term (current) use of aspirin: Secondary | ICD-10-CM | POA: Insufficient documentation

## 2024-04-13 MED ORDER — OXYCODONE HCL 5 MG PO TABS
10.0000 mg | ORAL_TABLET | Freq: Once | ORAL | Status: AC
  Administered 2024-04-13: 10 mg via ORAL
  Filled 2024-04-13: qty 2

## 2024-04-13 MED ORDER — ACETAMINOPHEN 500 MG PO TABS
1000.0000 mg | ORAL_TABLET | Freq: Once | ORAL | Status: AC
  Administered 2024-04-13: 1000 mg via ORAL
  Filled 2024-04-13: qty 2

## 2024-04-13 NOTE — ED Notes (Signed)
 Warm blankets applied
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
## 2024-04-13 NOTE — ED Notes (Addendum)
 Pt leaving in no new onset distress at this time. Paper work reviewed with pt. Pt is being moved to lobby to await pick up by third party pt called.

## 2024-04-13 NOTE — Discharge Instructions (Signed)
 There is no evidence of fracture on your right shoulder.  I would continue taking ibuprofen  or Tylenol  as needed for pain.  You can follow-up with your primary care doctor for further evaluation.  You may return to the emergency department for any worsening symptoms.
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
## 2024-04-13 NOTE — ED Triage Notes (Signed)
 Patient just d/c from ED this morning and never left the lobby. Wanting to be re-evaluated for continued pain in L leg. Able to stand and pivot to bed from personal wheelchair.

## 2024-04-14 ENCOUNTER — Encounter (HOSPITAL_COMMUNITY): Payer: Self-pay | Admitting: Emergency Medicine

## 2024-04-14 ENCOUNTER — Other Ambulatory Visit: Payer: Self-pay

## 2024-04-14 ENCOUNTER — Emergency Department (HOSPITAL_COMMUNITY)
Admission: EM | Admit: 2024-04-14 | Discharge: 2024-04-15 | Disposition: A | Payer: MEDICAID | Source: Home / Self Care | Attending: Emergency Medicine | Admitting: Emergency Medicine

## 2024-04-14 ENCOUNTER — Emergency Department (HOSPITAL_COMMUNITY)
Admission: EM | Admit: 2024-04-14 | Discharge: 2024-04-14 | Disposition: A | Discharge status: Home / Self Care | Payer: MEDICAID

## 2024-04-14 ENCOUNTER — Encounter (HOSPITAL_COMMUNITY): Payer: Self-pay

## 2024-04-14 ENCOUNTER — Other Ambulatory Visit (HOSPITAL_COMMUNITY): Payer: Self-pay

## 2024-04-14 DIAGNOSIS — Z765 Malingerer [conscious simulation]: Secondary | ICD-10-CM | POA: Insufficient documentation

## 2024-04-14 DIAGNOSIS — Z794 Long term (current) use of insulin: Secondary | ICD-10-CM | POA: Insufficient documentation

## 2024-04-14 DIAGNOSIS — M25552 Pain in left hip: Secondary | ICD-10-CM | POA: Insufficient documentation

## 2024-04-14 DIAGNOSIS — M25511 Pain in right shoulder: Secondary | ICD-10-CM | POA: Insufficient documentation

## 2024-04-14 DIAGNOSIS — Z7982 Long term (current) use of aspirin: Secondary | ICD-10-CM | POA: Insufficient documentation

## 2024-04-14 DIAGNOSIS — G8929 Other chronic pain: Secondary | ICD-10-CM | POA: Insufficient documentation

## 2024-04-14 MED ORDER — ACETAMINOPHEN 325 MG PO TABS
650.0000 mg | ORAL_TABLET | Freq: Once | ORAL | Status: AC
  Administered 2024-04-14: 650 mg via ORAL
  Filled 2024-04-14: qty 2
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
## 2024-04-14 NOTE — Progress Notes (Signed)
 Visited with patient to provide emotional and spiritual support.  Patient said he was in a lot of pain in legs and shoulder. Chaplain available as needed.   Rayleen Levander Cullen n, [facility], Pager [phone]

## 2024-04-14 NOTE — ED Triage Notes (Signed)
 Pt [facility] the [facility] with c/o back pain, states that he couldn't sleep on the hard floor.

## 2024-04-14 NOTE — ED Notes (Signed)
"  Urine sample sent to lab.  "

## 2024-04-14 NOTE — ED Notes (Signed)
 Pt provided bag lunch and beverage

## 2024-04-15 ENCOUNTER — Emergency Department (HOSPITAL_COMMUNITY)
Admission: EM | Admit: 2024-04-15 | Discharge: 2024-04-16 | Disposition: A | Payer: MEDICAID | Source: Home / Self Care | Attending: Emergency Medicine | Admitting: Emergency Medicine

## 2024-04-15 ENCOUNTER — Emergency Department (HOSPITAL_COMMUNITY): Payer: MEDICAID

## 2024-04-15 ENCOUNTER — Emergency Department (HOSPITAL_COMMUNITY)
Admission: EM | Admit: 2024-04-15 | Discharge: 2024-04-15 | Disposition: A | Discharge status: Home / Self Care | Payer: MEDICAID | Source: Home / Self Care | Attending: Emergency Medicine | Admitting: Emergency Medicine

## 2024-04-15 ENCOUNTER — Other Ambulatory Visit: Payer: Self-pay

## 2024-04-15 ENCOUNTER — Encounter (HOSPITAL_COMMUNITY): Payer: Self-pay | Admitting: *Deleted

## 2024-04-15 ENCOUNTER — Encounter (HOSPITAL_COMMUNITY): Payer: Self-pay

## 2024-04-15 DIAGNOSIS — M25552 Pain in left hip: Secondary | ICD-10-CM

## 2024-04-15 DIAGNOSIS — M79652 Pain in left thigh: Secondary | ICD-10-CM

## 2024-04-15 MED ORDER — IBUPROFEN 200 MG PO TABS
600.0000 mg | ORAL_TABLET | Freq: Once | ORAL | Status: AC
  Administered 2024-04-15: 600 mg via ORAL
  Filled 2024-04-15: qty 3

## 2024-04-15 MED ORDER — ACETAMINOPHEN 500 MG PO TABS
1000.0000 mg | ORAL_TABLET | Freq: Once | ORAL | Status: AC
  Administered 2024-04-15: 1000 mg via ORAL
  Filled 2024-04-15: qty 2

## 2024-04-15 NOTE — Discharge Instructions (Addendum)
 Your x-ray is normal today.  Take Tylenol  or ibuprofen  every 6 hours as needed for pain.  Please call orthopedics or primary care doctors to schedule follow-up for continued pain.   Return to ED if any symptoms worsen including severe discoloration to the leg or signs of infection around the hip.

## 2024-04-15 NOTE — ED Provider Notes (Signed)
 " [place] EMERGENCY DEPARTMENT AT [facility] Provider Note   CSN: [id number] Arrival date & time: 04/15/24  2037     Patient presents with: Leg Pain   David Wyatt is a 64 y.o. male with history of cocaine  abuse, hypertension, type 2 diabetes, recent ORIF of the left femur on 03/14/24 by Dr. Beuford, presents with concern for ongoing left thigh pain since his surgery.  Reports that  pain is worse when he touches the area.  Denies any new injuries to the area.  Denies any numbness in his extremities.  Reports that his pain is not well-controlled.  He states he has not taken anything yet today for pain.  Of note, patient was also seen for the same concern earlier today at [facility] where he had a x-ray performed of his hip and left femur which did not show any acute injuries or loosening of the hardware.    Leg Pain      Prior to Admission medications  Medication Sig Start Date End Date Taking? Authorizing Provider  Accu-Chek Softclix Lancets lancets Use as directed. 02/03/24   Barrett, Shona MATSU, PA-C  acetaminophen  (TYLENOL ) 500 MG tablet Take 2 tablets (1,000 mg total) by mouth 3 (three) times daily for 7 days. 04/12/24 04/19/24  Ezenduka, Nkeiruka J, MD  amLODipine  (NORVASC ) 10 MG tablet Take 1 tablet (10 mg total) by mouth daily. 04/12/24   Ezenduka, Nkeiruka J, MD  aspirin  EC 81 MG tablet Take 1 tablet (81 mg total) by mouth 2 (two) times daily. Swallow whole. 04/12/24 05/24/24  Ezenduka, Nkeiruka J, MD  atorvastatin  (LIPITOR) 40 MG tablet Take 1 tablet (40 mg total) by mouth daily. 04/12/24   Ezenduka, Nkeiruka J, MD  carvedilol  (COREG ) 3.125 MG tablet Take 1 tablet (3.125 mg total) by mouth 2 (two) times daily with a meal. 04/12/24   Donnamarie Lebron PARAS, MD  docusate sodium  (COLACE) 100 MG capsule Take 1 capsule (100 mg total) by mouth 2 (two) times daily. 03/16/24   Caleen Burgess BROCKS, MD  DULoxetine  (CYMBALTA ) 30 MG capsule Take 1 capsule (30 mg total) by mouth daily with breakfast.  02/17/24   Brien Belvie BRAVO, MD  DULoxetine  (CYMBALTA ) 60 MG capsule Take 1 capsule (60 mg total) by mouth every evening. 04/12/24   Ezenduka, Nkeiruka J, MD  folic acid  (FOLVITE ) 1 MG tablet Take 1 tablet (1 mg total) by mouth daily. 01/25/24   Caleen Qualia, MD  gabapentin  (NEURONTIN ) 300 MG capsule Take 1 capsule (300 mg total) by mouth 2 (two) times daily. 04/12/24 05/12/24  Ezenduka, Nkeiruka J, MD  gabapentin  (NEURONTIN ) 400 MG capsule Take 1 capsule (400 mg total) by mouth at bedtime. 04/12/24 05/12/24  Ezenduka, Nkeiruka J, MD  glucose blood (ACCU-CHEK GUIDE TEST) test strip Use as instructed to check blood sugar three times daily 02/03/24   Barrett, Shona MATSU, PA-C  glucose blood (ACCU-CHEK GUIDE TEST) test strip Use as instructed 03/09/24   Brien Belvie BRAVO, MD  guaiFENesin  (ROBITUSSIN) 100 MG/5ML liquid Take 10 mLs by mouth every 8 (eight) hours. 03/08/24   Cosette Blackwater, MD  insulin  aspart (NOVOLOG ) 100 UNIT/ML FlexPen Before each meal 3 times a day, If less than 150 do not give, 151-199 - 2 units, 200-250 - 6 units, 251-299 - 8 units,  300-349 - 10 units,  350 or above 12 units. 02/03/24   Barrett, Shona MATSU, PA-C  insulin  glargine (LANTUS ) 100 UNIT/ML Solostar Pen Inject 20 Units into the skin at bedtime. Patient  taking differently: Inject 15 Units into the skin at bedtime. 02/17/24   Brien Belvie BRAVO, MD  Insulin  Pen Needle 32G X 4 MM MISC Use 1 each to inject insulin  into the skin 4 (four) times daily -  before meals and at bedtime. 02/17/24   Brien Belvie BRAVO, MD  isosorbide  mononitrate (IMDUR ) 30 MG 24 hr tablet Take 1 tablet (30 mg total) by mouth daily. 04/12/24   Ezenduka, Nkeiruka J, MD  lidocaine  (LIDODERM ) 5 % Place 1 patch onto the skin daily. Remove & Discard patch within 12 hours or as directed by MD 04/12/24   Ezenduka, Nkeiruka J, MD  metFORMIN  (GLUCOPHAGE ) 1000 MG tablet Take 1 tablet (1,000 mg total) by mouth 2 (two) times daily with a meal. 02/17/24   Brien Belvie BRAVO, MD   methocarbamol  (ROBAXIN ) 500 MG tablet Take 1-2 tablets (500-1,000 mg total) by mouth every 6 (six) hours as needed for up to 15 days for muscle spasms. 04/12/24 04/27/24  Ezenduka, Nkeiruka J, MD  Multiple Vitamin (MULTIVITAMIN WITH MINERALS) TABS tablet Take 1 tablet by mouth daily. Patient not taking: Reported on 03/07/2024 01/25/24   Caleen Qualia, MD  nicotine  (NICODERM CQ  - DOSED IN MG/24 HOURS) 21 mg/24hr patch Place 1 patch (21 mg total) onto the skin daily. 03/16/24   Amin, Ankit C, MD  Nystatin (GERHARDT'S BUTT CREAM) CREA Apply 1 Application topically daily. 04/13/24   Ezenduka, Nkeiruka J, MD  ondansetron  (ZOFRAN -ODT) 4 MG disintegrating tablet Take 1 tablet (4 mg total) by mouth every 8 (eight) hours as needed for nausea or vomiting. 03/04/24   Neldon Hamp RAMAN, PA  Oxycodone  HCl 10 MG TABS Take 1 tablet (10 mg total) by mouth every 6 (six) hours as needed for severe pain (pain score 7-10). 04/12/24   Ezenduka, Nkeiruka J, MD  polyethylene glycol powder (GLYCOLAX /MIRALAX ) 17 GM/SCOOP powder Take 17 g by mouth daily. Dissolve 1 capful (17g) in 4-8 ounces of liquid and take by mouth daily. 04/12/24   Ezenduka, Nkeiruka J, MD  pregabalin  (LYRICA ) 100 MG capsule Take 1 capsule (100 mg total) by mouth 2 (two) times daily. 04/12/24 05/12/24  Ezenduka, Nkeiruka J, MD  senna-docusate (SENOKOT-S) 8.6-50 MG tablet Take 1 tablet by mouth 2 (two) times daily. 04/12/24 05/12/24  Ezenduka, Nkeiruka J, MD  tamsulosin  (FLOMAX ) 0.4 MG CAPS capsule Take 1 capsule (0.4 mg total) by mouth daily. 04/13/24   Ezenduka, Nkeiruka J, MD  thiamine  (VITAMIN B1) 100 MG tablet Take 1 tablet (100 mg total) by mouth daily. 01/25/24   Amin, Sumayya, MD  traZODone  (DESYREL ) 50 MG tablet Take 1 tablet (50 mg total) by mouth at bedtime. 04/12/24   Ezenduka, Nkeiruka J, MD  zolpidem  (AMBIEN ) 5 MG tablet Take 1 tablet (5 mg total) by mouth at bedtime. 04/12/24   Donnamarie Lebron PARAS, MD    Allergies: Lisinopril     Review of Systems   Musculoskeletal:        Left hip pain    Updated Vital Signs BP (!) 149/88 (BP Location: Left Arm)   Pulse 97   Temp 98.5 F (36.9 C) (Oral)   Resp 19   SpO2 95%   Physical Exam Vitals and nursing note reviewed.  Constitutional:      General: He is not in acute distress.    Appearance: He is well-developed.  HENT:     Head: Normocephalic and atraumatic.  Eyes:     Conjunctiva/sclera: Conjunctivae normal.  Cardiovascular:     Rate and Rhythm: Normal  rate and regular rhythm.     Heart sounds: No murmur heard.    Comments: 1+ pedal pulses bilaterally Pulmonary:     Effort: Pulmonary effort is normal. No respiratory distress.     Breath sounds: Normal breath sounds.  Abdominal:     Palpations: Abdomen is soft.     Tenderness: There is no abdominal tenderness.     Comments: No inguinal hernias  Musculoskeletal:        General: No swelling.     Cervical back: Neck supple.     Comments: Tender to palpation over the left upper femur. Full range of motion of the hips, knees, ankles bilaterally.  Able to ambulate without difficulty.  Mild nonpitting edema in the patient's left foot.   Skin:    General: Skin is warm and dry.     Capillary Refill: Capillary refill takes less than 2 seconds.     Comments: Surgical scar on left hip without signs of dehiscence.  No surrounding erythema or edema  Neurological:     Mental Status: He is alert.  Psychiatric:        Mood and Affect: Mood normal.     (all labs ordered are listed, but only abnormal results are displayed) Labs Reviewed - No data to display  EKG: None  Radiology: DG Hip Unilat W or Wo Pelvis 2-3 Views Left Result Date: 04/15/2024 CLINICAL DATA:  [place], recent surgery EXAM: DG HIP (WITH OR WITHOUT PELVIS) 2-3V LEFT COMPARISON:  04/13/2024 FINDINGS: Frontal view of the pelvis as well as frontal and frogleg lateral views of the left hip are obtained. Postsurgical changes from recent ORIF for a comminuted  intertrochanteric left hip fracture. No evidence of orthopedic hardware failure or loosening. Stable alignment of the fracture fragments. No new fractures are identified. Mild symmetrical bilateral hip osteoarthritis. Stable postsurgical changes within the lumbar spine. IMPRESSION: 1. Stable ORIF of an intertrochanteric left hip fracture. No change in fracture alignment. 2. No acute bony abnormalities. Electronically Signed   By: Ozell Daring M.D.   On: 04/15/2024 15:23     Procedures   Medications Ordered in the ED  ibuprofen  (ADVIL ) tablet 600 mg (600 mg Oral Given 04/15/24 2240)  acetaminophen  (TYLENOL ) tablet 1,000 mg (1,000 mg Oral Given 04/15/24 2240)                                    Medical Decision Making Risk OTC drugs.     Differential diagnosis includes but is not limited to fracture, dislocation, hernia, DVT, diverticulitis, cellulitis, surgical site infection  ED Course:  Upon initial evaluation, patient is disheveled appearing, but no acute distress.  Normal vitals aside from his elevated blood pressure upon arrival at 149/88.  Tender over the proximal aspect of the left femur.  Neurovascularly intact in the bilateral lower extremities.  Has full range of motion in the bilateral lower extremities.  He was just seen [facility] room earlier today and had an x-ray done of his left femur where he was reporting pain.  There was no acute abnormality noted on x-ray earlier today.  He has hardware from the ORIF of his intact without any signs of loosening.  He denies any further injuries, no indication for repeat x-ray.  The surgical incision site is well-healed without any surrounding erythema or edema, I doubt any surgical site infection. No abdominal pain on exam.  No indication  for abdominal labs at this time. Patient reports his concern is his pain.  He has not taking anything yet for pain today.  Will give Tylenol  and ibuprofen  for pain.   Medications  Given: Tylenol  Ibuprofen   Upon re-evaluation, patient reports pain has improved.  Patient has had 5 ER visits in the last 3 days for this concern.  He has had x-ray performed already. Only workup not performed already is DVT study. I discussed with patient that I would like to get a ultrasound of his left leg to rule out DVT given his recent surgery and pain in his left leg.  I have low clinical concern for DVT at this time given no significant edema of the left lower extremity on exam, 1+ pedal pulses bilaterally.  Will not start patient on blood thinner prophylactically.  Unfortunately, ultrasound is not available at [place] [facility] currently.  I will order this as an outpatient study for patient to have performed for further evaluation into his pain. This does seem consistent with malingering as when I discussed that patient will be going home tonight, he asked why he cannot stay here as he is getting better sleep here than in the [facility].   Patient stable and appropriate for discharge home this time  Impression: Left thigh pain   Disposition:  The patient was discharged home with instructions to go to [facility] tomorrow to obtain his ultrasound of the left lower extremity.  Tylenol  and ibuprofen  as needed for pain. Return precautions given and patient verbalized understanding.    Record Review: External records from outside source obtained and reviewed including ER visit from earlier today and x-ray of the patient's pelvis     This chart was dictated using voice recognition software, Dragon. Despite the best efforts of this provider to proofread and correct errors, errors may still occur which can change documentation meaning.       Final diagnoses:  Left thigh pain    ED Discharge Orders          Ordered    [facility]        04/15/24 2340               Veta Palma, [place] 04/15/24 2350  "

## 2024-04-15 NOTE — ED Triage Notes (Signed)
 BIB [facility] from [facility] for L leg pain. Reports onset since recent surgery using rods. Endorses cold, hungry, and L leg pain from hip to foot. States, has been sleeping on cold hard ground. Alert, NAD, calm, interactive, resps e/u, speaking in clear complete sentences. Eating/ drinking at this time. No obvious swelling. Pedal pulses palpable. Leg warm. Wearing shoes. Sitting in w/c.
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
## 2024-04-15 NOTE — ED Notes (Addendum)
 Pt refuses vitals at time of d/c. Pt requesting and trying to roll out with the wheelchair to go to lobby after being told he was d/c so he could make a phone call.

## 2024-04-15 NOTE — Discharge Instructions (Addendum)
 The x-ray is your femur did not show any injuries earlier today.  I did order an ultrasound of your left leg to evaluate for blood clot.  You will need to get this done in an outpatient basis at [facility].  Please see the attached instructions for where to go for this ultrasound.  Please get your ultrasound done tomorrow.   You may take up to 1000mg  of tylenol  every 6 hours as needed for pain.  Do not take more then 4g per day.  You may use up to 600mg  ibuprofen  every 6 hours as needed for pain.  Do not exceed 2.4g of ibuprofen  per day.  Please return to the ER for any emergent concerns.

## 2024-04-15 NOTE — ED Provider Triage Note (Signed)
 Emergency Medicine Provider Triage Evaluation Note  Ryson Bacha , a 64 y.o. male  was evaluated in triage.  Pt complains of left upper thigh pain ongoing since his surgery.  Denies any injuries.  Denies any numbness in his lower extremities.  He reports that he has not taken anything for his pain today.  Of note, he was seen at [facility] earlier today and had a x-ray done of his femur and hand pelvis which showed a stable ORIF, and no acute abnormalities.  Review of Systems  Positive: As above Negative: As above  Physical Exam  BP (!) 149/88 (BP Location: Left Arm)   Pulse 97   Temp 98.5 F (36.9 C) (Oral)   Resp 19   SpO2 95%  Gen:   Awake, no distress   Resp:  Normal effort  MSK:   Moves extremities without difficulty  Other:  1+ pedal pulses bilaterally, mild edema in left foot. Tender to left upper femur   Medical Decision Making  Medically screening exam initiated at 9:32 PM.  Appropriate orders placed.  [name] was informed that the remainder of the evaluation will be completed by another provider, this initial triage assessment does not replace that evaluation, and the importance of remaining in the ED until their evaluation is complete.  Do not feel patient needs repeat imaging at this time.  Will attempt pain control.  Ultrasound ordered to rule out DVT given recent surgery.   Veta Palma, PA-C 04/15/24 2132

## 2024-04-15 NOTE — ED Triage Notes (Signed)
 Pt. [facility]. Pt wanting to be seen for pain control.

## 2024-04-16 ENCOUNTER — Other Ambulatory Visit (HOSPITAL_COMMUNITY): Payer: Self-pay

## 2024-04-16 ENCOUNTER — Other Ambulatory Visit: Payer: Self-pay

## 2024-04-16 ENCOUNTER — Encounter (HOSPITAL_COMMUNITY): Payer: Self-pay | Admitting: Emergency Medicine

## 2024-04-16 ENCOUNTER — Emergency Department (HOSPITAL_COMMUNITY)
Admission: EM | Admit: 2024-04-16 | Payer: MEDICAID | Source: Home / Self Care | Attending: Emergency Medicine | Admitting: Emergency Medicine

## 2024-04-16 ENCOUNTER — Telehealth: Payer: Self-pay | Admitting: Family Medicine

## 2024-04-16 ENCOUNTER — Emergency Department (HOSPITAL_COMMUNITY)
Admission: EM | Admit: 2024-04-16 | Discharge: 2024-04-16 | Disposition: A | Discharge status: Home / Self Care | Payer: MEDICAID | Source: Home / Self Care | Attending: Emergency Medicine | Admitting: Emergency Medicine

## 2024-04-16 DIAGNOSIS — K5900 Constipation, unspecified: Secondary | ICD-10-CM

## 2024-04-16 DIAGNOSIS — F4323 Adjustment disorder with mixed anxiety and depressed mood: Secondary | ICD-10-CM | POA: Insufficient documentation

## 2024-04-16 DIAGNOSIS — N390 Urinary tract infection, site not specified: Secondary | ICD-10-CM

## 2024-04-16 DIAGNOSIS — M25552 Pain in left hip: Secondary | ICD-10-CM

## 2024-04-16 LAB — URINALYSIS, ROUTINE W REFLEX MICROSCOPIC
Bilirubin Urine: NEGATIVE
Bilirubin Urine: NEGATIVE
Glucose, UA: >=500 mg/dL — AB
Glucose, UA: >=500 mg/dL — AB
Ketones, ur: NEGATIVE mg/dL
Ketones, ur: NEGATIVE mg/dL
Nitrite: NEGATIVE
Nitrite: NEGATIVE
Protein, ur: 30 mg/dL — AB
Protein, ur: NEGATIVE mg/dL
Specific Gravity, Urine: 1.01 (ref 1.005–1.030)
Specific Gravity, Urine: 1.021 (ref 1.005–1.030)
WBC, UA: >50 WBC/hpf (ref 0–5)
WBC, UA: >50 WBC/hpf (ref 0–5)
pH: 8 (ref 5.0–8.0)
pH: 7 (ref 5.0–8.0)

## 2024-04-16 LAB — CBC WITH DIFFERENTIAL/PLATELET
Abs Immature Granulocytes: 0.04 10*3/uL (ref 0.00–0.07)
Basophils Absolute: 0.1 10*3/uL (ref 0.0–0.1)
Basophils Relative: 1 %
Eosinophils Absolute: 0.3 10*3/uL (ref 0.0–0.5)
Eosinophils Relative: 3 %
HCT: 36.2 % — ABNORMAL LOW (ref 39.0–52.0)
Hemoglobin: 11.3 g/dL — ABNORMAL LOW (ref 13.0–17.0)
Immature Granulocytes: 0 %
Lymphocytes Relative: 20 %
Lymphs Abs: 2 10*3/uL (ref 0.7–4.0)
MCH: 30 pg (ref 26.0–34.0)
MCHC: 31.2 g/dL (ref 30.0–36.0)
MCV: 96 fL (ref 80.0–100.0)
Monocytes Absolute: 0.8 10*3/uL (ref 0.1–1.0)
Monocytes Relative: 8 %
Neutro Abs: 7 10*3/uL (ref 1.7–7.7)
Neutrophils Relative %: 68 %
Platelets: 444 10*3/uL — ABNORMAL HIGH (ref 150–400)
RBC: 3.77 MIL/uL — ABNORMAL LOW (ref 4.22–5.81)
RDW: 13.8 % (ref 11.5–15.5)
WBC: 10.2 10*3/uL (ref 4.0–10.5)
nRBC: 0 % (ref 0.0–0.2)

## 2024-04-16 LAB — URINE DRUG SCREEN
Amphetamines: NEGATIVE
Barbiturates: NEGATIVE
Benzodiazepines: NEGATIVE
Cocaine: POSITIVE — AB
Fentanyl: NEGATIVE
Methadone Scn, Ur: NEGATIVE
Opiates: NEGATIVE
Tetrahydrocannabinol: NEGATIVE

## 2024-04-16 LAB — COMPREHENSIVE METABOLIC PANEL WITH GFR
ALT: 24 U/L (ref 0–44)
AST: 30 U/L (ref 15–41)
Albumin: 3.8 g/dL (ref 3.5–5.0)
Alkaline Phosphatase: 185 U/L — ABNORMAL HIGH (ref 38–126)
Anion gap: 11 (ref 5–15)
BUN: 21 mg/dL (ref 8–23)
CO2: 23 mmol/L (ref 22–32)
Calcium: 9.3 mg/dL (ref 8.9–10.3)
Chloride: 103 mmol/L (ref 98–111)
Creatinine, Ser: 1.09 mg/dL (ref 0.61–1.24)
GFR, Estimated: >60 mL/min
Glucose, Bld: 335 mg/dL — ABNORMAL HIGH (ref 70–99)
Potassium: 4.5 mmol/L (ref 3.5–5.1)
Sodium: 136 mmol/L (ref 135–145)
Total Bilirubin: 0.4 mg/dL (ref 0.0–1.2)
Total Protein: 7.8 g/dL (ref 6.5–8.1)

## 2024-04-16 LAB — ETHANOL: Alcohol, Ethyl (B): <15 mg/dL

## 2024-04-16 LAB — CBG MONITORING, ED
Glucose-Capillary: 183 mg/dL — ABNORMAL HIGH (ref 70–99)
Glucose-Capillary: 288 mg/dL — ABNORMAL HIGH (ref 70–99)

## 2024-04-16 MED ORDER — CEFUROXIME AXETIL 250 MG PO TABS
500.0000 mg | ORAL_TABLET | Freq: Two times a day (BID) | ORAL | Status: AC
  Administered 2024-04-16: 500 mg via ORAL
  Filled 2024-04-16 (×2): qty 2

## 2024-04-16 MED ORDER — CEFUROXIME AXETIL 500 MG PO TABS
500.0000 mg | ORAL_TABLET | Freq: Two times a day (BID) | ORAL | 0 refills | Status: AC
  Filled 2024-04-16: qty 14, 7d supply, fill #0

## 2024-04-16 MED ORDER — OXYCODONE HCL 5 MG PO TABS: 5.0000 mg | ORAL_TABLET | Freq: Once | ORAL | Status: AC

## 2024-04-16 MED ORDER — DOCUSATE SODIUM 100 MG PO CAPS
100.0000 mg | ORAL_CAPSULE | Freq: Once | ORAL | Status: AC
  Administered 2024-04-16: 100 mg via ORAL

## 2024-04-16 MED ORDER — POLYETHYLENE GLYCOL 3350 17 G PO PACK
17.0000 g | PACK | Freq: Every day | ORAL | Status: DC
  Administered 2024-04-16: 17 g via ORAL
  Filled 2024-04-16: qty 1

## 2024-04-16 MED ORDER — ASPIRIN 81 MG PO TBEC
81.0000 mg | DELAYED_RELEASE_TABLET | Freq: Two times a day (BID) | ORAL | Status: AC
  Administered 2024-04-16: 81 mg via ORAL
  Filled 2024-04-16: qty 1

## 2024-04-16 MED ORDER — CARVEDILOL 3.125 MG PO TABS
3.1250 mg | ORAL_TABLET | Freq: Two times a day (BID) | ORAL | Status: AC
  Administered 2024-04-16: 3.125 mg via ORAL
  Filled 2024-04-16: qty 1

## 2024-04-16 MED ORDER — CYCLOBENZAPRINE HCL 10 MG PO TABS
5.0000 mg | ORAL_TABLET | Freq: Three times a day (TID) | ORAL | Status: AC | PRN
  Administered 2024-04-16: 5 mg via ORAL
  Filled 2024-04-16: qty 1

## 2024-04-16 MED ORDER — ATORVASTATIN CALCIUM 40 MG PO TABS
40.0000 mg | ORAL_TABLET | Freq: Every day | ORAL | Status: AC
  Administered 2024-04-16: 40 mg via ORAL
  Filled 2024-04-16: qty 4

## 2024-04-16 MED ORDER — AMLODIPINE BESYLATE 5 MG PO TABS
10.0000 mg | ORAL_TABLET | Freq: Every day | ORAL | Status: AC
  Administered 2024-04-16: 10 mg via ORAL
  Filled 2024-04-16: qty 2

## 2024-04-16 MED ORDER — ACETAMINOPHEN 500 MG PO TABS
1000.0000 mg | ORAL_TABLET | Freq: Three times a day (TID) | ORAL | Status: AC
  Administered 2024-04-16 (×2): 1000 mg via ORAL
  Filled 2024-04-16 (×2): qty 2
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
## 2024-04-16 NOTE — ED Notes (Signed)
 States, he can't walk, needs a voucher to get to the [facility]. SW notified. Meds given.

## 2024-04-16 NOTE — Discharge Instructions (Addendum)
 Continue Miralax  and Colace for management of your constipation, ensure that you are drinking an adequate amount of water , as dehydration can contribute to worsening constipation. You were found to have a urinary tract infection today, start Ceftin  and take one tablet by mouth twice daily for 7 days.

## 2024-04-16 NOTE — BH Assessment (Signed)
 Patient was deferred to [facility] for a telepsych assessment. The assigned care coordinator will provide updates regarding the scheduling of the assessment. [facility]IS coordinator can be reached at [phone] for further information on the timing of the telepsych evaluation.

## 2024-04-16 NOTE — ED Notes (Addendum)
 Pt becoming increasingly agitated and threatening to pull items off the wall. RN attempted top redirect to calm pt down. RN messaged MD on pain meds
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
## 2024-04-16 NOTE — ED Provider Notes (Signed)
 " [place] EMERGENCY DEPARTMENT AT [facility] Provider Note   CSN: [id number] Arrival date & time: 04/16/24  9475     Patient presents with: Constipation   David Wyatt is a 64 y.o. male.   64 year old male presenting with abdominal pain.  Patient reports that he is homeless and cannot remember the last time that he had a bowel movement and believes that this is contributing to his abdominal pain, reports some dysuria as well, denies nausea/vomiting.  He is also complaining that his blood sugar is high, last time he checked it was in the 300s.  He admits that he is supposed to be on insulin  for management of his diabetes, he does not have access to this medication currently.  Reports that he has been eating/drinking okay.   Constipation      Prior to Admission medications  Medication Sig Start Date End Date Taking? Authorizing Provider  Accu-Chek Softclix Lancets lancets Use as directed. 02/03/24   Barrett, Shona MATSU, PA-C  acetaminophen  (TYLENOL ) 500 MG tablet Take 2 tablets (1,000 mg total) by mouth 3 (three) times daily for 7 days. 04/12/24 04/19/24  Ezenduka, Nkeiruka J, MD  amLODipine  (NORVASC ) 10 MG tablet Take 1 tablet (10 mg total) by mouth daily. 04/12/24   Ezenduka, Nkeiruka J, MD  aspirin  EC 81 MG tablet Take 1 tablet (81 mg total) by mouth 2 (two) times daily. Swallow whole. 04/12/24 05/24/24  Ezenduka, Nkeiruka J, MD  atorvastatin  (LIPITOR) 40 MG tablet Take 1 tablet (40 mg total) by mouth daily. 04/12/24   Ezenduka, Nkeiruka J, MD  carvedilol  (COREG ) 3.125 MG tablet Take 1 tablet (3.125 mg total) by mouth 2 (two) times daily with a meal. 04/12/24   Donnamarie Lebron PARAS, MD  docusate sodium  (COLACE) 100 MG capsule Take 1 capsule (100 mg total) by mouth 2 (two) times daily. 03/16/24   Caleen Burgess BROCKS, MD  DULoxetine  (CYMBALTA ) 30 MG capsule Take 1 capsule (30 mg total) by mouth daily with breakfast. 02/17/24   Brien Belvie BRAVO, MD  DULoxetine  (CYMBALTA ) 60 MG capsule Take 1  capsule (60 mg total) by mouth every evening. 04/12/24   Ezenduka, Nkeiruka J, MD  folic acid  (FOLVITE ) 1 MG tablet Take 1 tablet (1 mg total) by mouth daily. 01/25/24   Amin, Sumayya, MD  gabapentin  (NEURONTIN ) 300 MG capsule Take 1 capsule (300 mg total) by mouth 2 (two) times daily. 04/12/24 05/12/24  Ezenduka, Nkeiruka J, MD  gabapentin  (NEURONTIN ) 400 MG capsule Take 1 capsule (400 mg total) by mouth at bedtime. 04/12/24 05/12/24  Ezenduka, Nkeiruka J, MD  glucose blood (ACCU-CHEK GUIDE TEST) test strip Use as instructed to check blood sugar three times daily 02/03/24   Barrett, Shona MATSU, PA-C  glucose blood (ACCU-CHEK GUIDE TEST) test strip Use as instructed 03/09/24   Brien Belvie BRAVO, MD  guaiFENesin  (ROBITUSSIN) 100 MG/5ML liquid Take 10 mLs by mouth every 8 (eight) hours. 03/08/24   Cosette Blackwater, MD  insulin  aspart (NOVOLOG ) 100 UNIT/ML FlexPen Before each meal 3 times a day, If less than 150 do not give, 151-199 - 2 units, 200-250 - 6 units, 251-299 - 8 units,  300-349 - 10 units,  350 or above 12 units. 02/03/24   Barrett, Shona MATSU, PA-C  insulin  glargine (LANTUS ) 100 UNIT/ML Solostar Pen Inject 20 Units into the skin at bedtime. Patient taking differently: Inject 15 Units into the skin at bedtime. 02/17/24   Brien Belvie BRAVO, MD  Insulin  Pen Needle 32G X 4  MM MISC Use 1 each to inject insulin  into the skin 4 (four) times daily -  before meals and at bedtime. 02/17/24   Brien Belvie BRAVO, MD  isosorbide  mononitrate (IMDUR ) 30 MG 24 hr tablet Take 1 tablet (30 mg total) by mouth daily. 04/12/24   Ezenduka, Nkeiruka J, MD  lidocaine  (LIDODERM ) 5 % Place 1 patch onto the skin daily. Remove & Discard patch within 12 hours or as directed by MD 04/12/24   Ezenduka, Nkeiruka J, MD  metFORMIN  (GLUCOPHAGE ) 1000 MG tablet Take 1 tablet (1,000 mg total) by mouth 2 (two) times daily with a meal. 02/17/24   Brien Belvie BRAVO, MD  methocarbamol  (ROBAXIN ) 500 MG tablet Take 1-2 tablets (500-1,000 mg total) by mouth  every 6 (six) hours as needed for up to 15 days for muscle spasms. 04/12/24 04/27/24  Ezenduka, Nkeiruka J, MD  Multiple Vitamin (MULTIVITAMIN WITH MINERALS) TABS tablet Take 1 tablet by mouth daily. Patient not taking: Reported on 03/07/2024 01/25/24   Caleen Qualia, MD  nicotine  (NICODERM CQ  - DOSED IN MG/24 HOURS) 21 mg/24hr patch Place 1 patch (21 mg total) onto the skin daily. 03/16/24   Amin, Ankit C, MD  Nystatin (GERHARDT'S BUTT CREAM) CREA Apply 1 Application topically daily. 04/13/24   Ezenduka, Nkeiruka J, MD  ondansetron  (ZOFRAN -ODT) 4 MG disintegrating tablet Take 1 tablet (4 mg total) by mouth every 8 (eight) hours as needed for nausea or vomiting. 03/04/24   Neldon Hamp RAMAN, PA  Oxycodone  HCl 10 MG TABS Take 1 tablet (10 mg total) by mouth every 6 (six) hours as needed for severe pain (pain score 7-10). 04/12/24   Ezenduka, Nkeiruka J, MD  polyethylene glycol powder (GLYCOLAX /MIRALAX ) 17 GM/SCOOP powder Take 17 g by mouth daily. Dissolve 1 capful (17g) in 4-8 ounces of liquid and take by mouth daily. 04/12/24   Ezenduka, Nkeiruka J, MD  pregabalin  (LYRICA ) 100 MG capsule Take 1 capsule (100 mg total) by mouth 2 (two) times daily. 04/12/24 05/12/24  Ezenduka, Nkeiruka J, MD  senna-docusate (SENOKOT-S) 8.6-50 MG tablet Take 1 tablet by mouth 2 (two) times daily. 04/12/24 05/12/24  Ezenduka, Nkeiruka J, MD  tamsulosin  (FLOMAX ) 0.4 MG CAPS capsule Take 1 capsule (0.4 mg total) by mouth daily. 04/13/24   Ezenduka, Nkeiruka J, MD  thiamine  (VITAMIN B1) 100 MG tablet Take 1 tablet (100 mg total) by mouth daily. 01/25/24   Amin, Sumayya, MD  traZODone  (DESYREL ) 50 MG tablet Take 1 tablet (50 mg total) by mouth at bedtime. 04/12/24   Ezenduka, Nkeiruka J, MD  zolpidem  (AMBIEN ) 5 MG tablet Take 1 tablet (5 mg total) by mouth at bedtime. 04/12/24   Donnamarie Lebron PARAS, MD    Allergies: Lisinopril     Review of Systems  Gastrointestinal:  Positive for constipation.    Updated Vital Signs  Vitals:   04/16/24 0536   BP: (!) 150/87  Pulse: 89  Resp: 19  Temp: 98.3 F (36.8 C)  TempSrc: Oral  SpO2: 99%     Physical Exam Vitals and nursing note reviewed.  Constitutional:      General: He is not in acute distress.    Appearance: Normal appearance. He is not ill-appearing or toxic-appearing.  HENT:     Head: Normocephalic and atraumatic.  Eyes:     Extraocular Movements: Extraocular movements intact.     Pupils: Pupils are equal, round, and reactive to light.  Cardiovascular:     Rate and Rhythm: Normal rate.  Pulmonary:  Effort: Pulmonary effort is normal.  Abdominal:     Palpations: Abdomen is soft.     Tenderness: There is abdominal tenderness (generalized).  Musculoskeletal:     Cervical back: Normal range of motion.     Comments: Moves all extremities spontaneously without difficulty  Skin:    General: Skin is warm and dry.  Neurological:     General: No focal deficit present.     Mental Status: He is alert and oriented to person, place, and time.     (all labs ordered are listed, but only abnormal results are displayed) Labs Reviewed  URINALYSIS, ROUTINE W REFLEX MICROSCOPIC - Abnormal; Notable for the following components:      Result Value   APPearance HAZY (*)    Glucose, UA >=500 (*)    Hgb urine dipstick MODERATE (*)    Protein, ur 30 (*)    Leukocytes,Ua LARGE (*)    Bacteria, UA FEW (*)    All other components within normal limits  CBG MONITORING, ED - Abnormal; Notable for the following components:   Glucose-Capillary 183 (*)    All other components within normal limits  URINE CULTURE  COMPREHENSIVE METABOLIC PANEL WITH GFR  LIPASE, BLOOD  CBC WITH DIFFERENTIAL/PLATELET    EKG: None  Radiology: DG Hip Unilat W or Wo Pelvis 2-3 Views Left Result Date: 04/15/2024 CLINICAL DATA:  Clemens, recent surgery EXAM: DG HIP (WITH OR WITHOUT PELVIS) 2-3V LEFT COMPARISON:  04/13/2024 FINDINGS: Frontal view of the pelvis as well as frontal and frogleg lateral views of  the left hip are obtained. Postsurgical changes from recent ORIF for a comminuted intertrochanteric left hip fracture. No evidence of orthopedic hardware failure or loosening. Stable alignment of the fracture fragments. No new fractures are identified. Mild symmetrical bilateral hip osteoarthritis. Stable postsurgical changes within the lumbar spine. IMPRESSION: 1. Stable ORIF of an intertrochanteric left hip fracture. No change in fracture alignment. 2. No acute bony abnormalities. Electronically Signed   By: Ozell Daring M.D.   On: 04/15/2024 15:23     Procedures   Medications Ordered in the ED  polyethylene glycol (MIRALAX  / GLYCOLAX ) packet 17 g (has no administration in time range)  docusate sodium  (COLACE) capsule 100 mg (has no administration in time range)                                    Medical Decision Making This patient presents to the ED for concern of abdominal pain, this involves an extensive number of treatment options, and is a complaint that carries with it a high risk of complications and morbidity.  The differential diagnosis includes constipation due to substance abuse, constipation due to poor PO intake, constipation due to other medication use, pancreatitis, appendicitis, diverticulitis, hyperglycemia    Co morbidities that complicate the patient evaluation  Polysubstance abuse, poorly controlled T2DM   Additional history obtained:  Additional history obtained from record review External records from outside source obtained and reviewed including prior ED notes   Lab Tests:  I Ordered, and personally interpreted labs.  The pertinent results include: Urinalysis notable for moderate RBCs with large leukocytes and few bacteria with white blood cell clumps, suggestive of urinary tract infection.  CBG 183.  Cardiac Monitoring: / EKG:  The patient was maintained on a cardiac monitor.  I personally viewed and interpreted the cardiac monitored which showed an  underlying rhythm of: NSR  Problem List / ED Course / Critical interventions / Medication management  I ordered medication including Miralax  and colace  for constipation  I have reviewed the patients home medicines and have made adjustments as needed   Social Determinants of Health:  Homelessness   Test / Admission - Considered:  Physical exam notable as above, patient does have generalized abdominal pain, no guarding/rigidity, he is well-appearing and in no acute distress, nontoxic in appearance.  Abdominal pain labs ordered, which patient initially agreed to, however patient declined collection of labs with nursing staff. CBG 183.  At reassessment, patient notes that he is feeling much better after getting some rest, he is requesting a sandwich and Diet Coke.  Patient is homeless, which is likely attributing to his presentation to the emergency department today, as he notes that it is too cold to be outside. Will order MiraLAX  and docusate for constipation, recommend adequate fluid intake as dehydration is likely contributing to his symptoms.  Urinalysis as above is notable for signs of infection, will start 7-day course of antibiotics. Strict return precautions discussed, patient voiced understanding is in agreement this plan, he is appropriate for discharge at this time.  Amount and/or Complexity of Data Reviewed Labs: ordered.  Risk OTC drugs. Prescription drug management.        Final diagnoses:  Urinary tract infection with hematuria, site unspecified  Constipation, unspecified constipation type    ED Discharge Orders          Ordered    cefUROXime  (CEFTIN ) 500 MG tablet  2 times daily with meals        04/16/24 [address] [place], [place] 04/16/24 1005    Garrick Charleston, MD 04/16/24 1322  "

## 2024-04-16 NOTE — ED Provider Notes (Signed)
 " [place] EMERGENCY DEPARTMENT AT [facility] Provider Note   CSN: [id number] Arrival date & time: 04/16/24  1326     Patient presents with: Hip Pain and Suicidal   David Wyatt is a 64 y.o. male history of hypertension, recent left hip fracture status post surgery here presenting with suicidal ideation and persistent left hip pain.  Patient is homeless currently.  Patient states that after the hip surgery he never went to rehab facility.  He states that he came here earlier today was diagnosed with UTI.  After he left the ED, he had some suicidal ideation.  He felt that he does not want to live anymore.  He also requesting rehab for his hip.  Patient is currently only on Tylenol  as needed for hip pain.  Patient has been seen multiple times in the ED.   The history is provided by the patient.       Prior to Admission medications  Medication Sig Start Date End Date Taking? Authorizing Provider  Accu-Chek Softclix Lancets lancets Use as directed. 02/03/24   Barrett, Shona MATSU, PA-C  acetaminophen  (TYLENOL ) 500 MG tablet Take 2 tablets (1,000 mg total) by mouth 3 (three) times daily for 7 days. 04/12/24 04/19/24  Ezenduka, Nkeiruka J, MD  amLODipine  (NORVASC ) 10 MG tablet Take 1 tablet (10 mg total) by mouth daily. 04/12/24   Ezenduka, Nkeiruka J, MD  aspirin  EC 81 MG tablet Take 1 tablet (81 mg total) by mouth 2 (two) times daily. Swallow whole. 04/12/24 05/24/24  Ezenduka, Nkeiruka J, MD  atorvastatin  (LIPITOR) 40 MG tablet Take 1 tablet (40 mg total) by mouth daily. 04/12/24   Ezenduka, Nkeiruka J, MD  carvedilol  (COREG ) 3.125 MG tablet Take 1 tablet (3.125 mg total) by mouth 2 (two) times daily with a meal. 04/12/24   Donnamarie Lebron PARAS, MD  cefUROXime  (CEFTIN ) 500 MG tablet Take 1 tablet (500 mg total) by mouth 2 (two) times daily with a meal for 7 days. 04/16/24 04/23/24  Glendia Rocky SAILOR, PA-C  docusate sodium  (COLACE) 100 MG capsule Take 1 capsule (100 mg total) by mouth 2 (two) times  daily. 03/16/24   Caleen Burgess BROCKS, MD  DULoxetine  (CYMBALTA ) 30 MG capsule Take 1 capsule (30 mg total) by mouth daily with breakfast. 02/17/24   Brien Belvie BRAVO, MD  DULoxetine  (CYMBALTA ) 60 MG capsule Take 1 capsule (60 mg total) by mouth every evening. 04/12/24   Ezenduka, Nkeiruka J, MD  folic acid  (FOLVITE ) 1 MG tablet Take 1 tablet (1 mg total) by mouth daily. 01/25/24   Caleen Qualia, MD  gabapentin  (NEURONTIN ) 300 MG capsule Take 1 capsule (300 mg total) by mouth 2 (two) times daily. 04/12/24 05/12/24  Ezenduka, Nkeiruka J, MD  gabapentin  (NEURONTIN ) 400 MG capsule Take 1 capsule (400 mg total) by mouth at bedtime. 04/12/24 05/12/24  Ezenduka, Nkeiruka J, MD  glucose blood (ACCU-CHEK GUIDE TEST) test strip Use as instructed to check blood sugar three times daily 02/03/24   Barrett, Shona MATSU, PA-C  glucose blood (ACCU-CHEK GUIDE TEST) test strip Use as instructed 03/09/24   Brien Belvie BRAVO, MD  guaiFENesin  (ROBITUSSIN) 100 MG/5ML liquid Take 10 mLs by mouth every 8 (eight) hours. 03/08/24   Cosette Blackwater, MD  insulin  aspart (NOVOLOG ) 100 UNIT/ML FlexPen Before each meal 3 times a day, If less than 150 do not give, 151-199 - 2 units, 200-250 - 6 units, 251-299 - 8 units,  300-349 - 10 units,  350 or above 12 units.  02/03/24   Barrett, Shona MATSU, PA-C  insulin  glargine (LANTUS ) 100 UNIT/ML Solostar Pen Inject 20 Units into the skin at bedtime. Patient taking differently: Inject 15 Units into the skin at bedtime. 02/17/24   Brien Belvie BRAVO, MD  Insulin  Pen Needle 32G X 4 MM MISC Use 1 each to inject insulin  into the skin 4 (four) times daily -  before meals and at bedtime. 02/17/24   Brien Belvie BRAVO, MD  isosorbide  mononitrate (IMDUR ) 30 MG 24 hr tablet Take 1 tablet (30 mg total) by mouth daily. 04/12/24   Ezenduka, Nkeiruka J, MD  lidocaine  (LIDODERM ) 5 % Place 1 patch onto the skin daily. Remove & Discard patch within 12 hours or as directed by MD 04/12/24   Ezenduka, Nkeiruka J, MD  metFORMIN  (GLUCOPHAGE )  1000 MG tablet Take 1 tablet (1,000 mg total) by mouth 2 (two) times daily with a meal. 02/17/24   Brien Belvie BRAVO, MD  methocarbamol  (ROBAXIN ) 500 MG tablet Take 1-2 tablets (500-1,000 mg total) by mouth every 6 (six) hours as needed for up to 15 days for muscle spasms. 04/12/24 04/27/24  Ezenduka, Nkeiruka J, MD  Multiple Vitamin (MULTIVITAMIN WITH MINERALS) TABS tablet Take 1 tablet by mouth daily. Patient not taking: Reported on 03/07/2024 01/25/24   Amin, Sumayya, MD  nicotine  (NICODERM CQ  - DOSED IN MG/24 HOURS) 21 mg/24hr patch Place 1 patch (21 mg total) onto the skin daily. 03/16/24   Amin, Ankit C, MD  Nystatin (GERHARDT'S BUTT CREAM) CREA Apply 1 Application topically daily. 04/13/24   Ezenduka, Nkeiruka J, MD  ondansetron  (ZOFRAN -ODT) 4 MG disintegrating tablet Take 1 tablet (4 mg total) by mouth every 8 (eight) hours as needed for nausea or vomiting. 03/04/24   Neldon Hamp RAMAN, PA  Oxycodone  HCl 10 MG TABS Take 1 tablet (10 mg total) by mouth every 6 (six) hours as needed for severe pain (pain score 7-10). 04/12/24   Ezenduka, Nkeiruka J, MD  polyethylene glycol powder (GLYCOLAX /MIRALAX ) 17 GM/SCOOP powder Take 17 g by mouth daily. Dissolve 1 capful (17g) in 4-8 ounces of liquid and take by mouth daily. 04/12/24   Ezenduka, Nkeiruka J, MD  pregabalin  (LYRICA ) 100 MG capsule Take 1 capsule (100 mg total) by mouth 2 (two) times daily. 04/12/24 05/12/24  Ezenduka, Nkeiruka J, MD  senna-docusate (SENOKOT-S) 8.6-50 MG tablet Take 1 tablet by mouth 2 (two) times daily. 04/12/24 05/12/24  Ezenduka, Nkeiruka J, MD  tamsulosin  (FLOMAX ) 0.4 MG CAPS capsule Take 1 capsule (0.4 mg total) by mouth daily. 04/13/24   Ezenduka, Nkeiruka J, MD  thiamine  (VITAMIN B1) 100 MG tablet Take 1 tablet (100 mg total) by mouth daily. 01/25/24   Amin, Sumayya, MD  traZODone  (DESYREL ) 50 MG tablet Take 1 tablet (50 mg total) by mouth at bedtime. 04/12/24   Ezenduka, Nkeiruka J, MD  zolpidem  (AMBIEN ) 5 MG tablet Take 1 tablet (5 mg total)  by mouth at bedtime. 04/12/24   Donnamarie Lebron PARAS, MD    Allergies: Lisinopril     Review of Systems  Musculoskeletal:        Left hip pain  All other systems reviewed and are negative.   Updated Vital Signs BP (!) 149/88   Pulse 73   Temp 98 F (36.7 C)   Resp 17   SpO2 97%   Physical Exam Vitals and nursing note reviewed.  Constitutional:      Comments: Uncomfortable and chronically ill   HENT:     Head: Normocephalic.  Nose: Nose normal.     Mouth/Throat:     Mouth: Mucous membranes are moist.  Eyes:     Extraocular Movements: Extraocular movements intact.     Pupils: Pupils are equal, round, and reactive to light.  Cardiovascular:     Rate and Rhythm: Normal rate and regular rhythm.     Pulses: Normal pulses.     Heart sounds: Normal heart sounds.  Pulmonary:     Effort: Pulmonary effort is normal.     Breath sounds: Normal breath sounds.  Abdominal:     General: Abdomen is flat.     Palpations: Abdomen is soft.  Musculoskeletal:     Cervical back: Normal range of motion and neck supple.     Comments: L hip surgery site healing well, no obvious super imposed infection   Skin:    General: Skin is warm.  Neurological:     General: No focal deficit present.     Mental Status: He is alert and oriented to person, place, and time.  Psychiatric:        Mood and Affect: Mood normal.        Behavior: Behavior normal.     (all labs ordered are listed, but only abnormal results are displayed) Labs Reviewed  COMPREHENSIVE METABOLIC PANEL WITH GFR - Abnormal; Notable for the following components:      Result Value   Glucose, Bld 335 (*)    Alkaline Phosphatase 185 (*)    All other components within normal limits  CBC WITH DIFFERENTIAL/PLATELET - Abnormal; Notable for the following components:   RBC 3.77 (*)    Hemoglobin 11.3 (*)    HCT 36.2 (*)    Platelets 444 (*)    All other components within normal limits  URINALYSIS, ROUTINE W REFLEX MICROSCOPIC -  Abnormal; Notable for the following components:   APPearance HAZY (*)    Glucose, UA >=500 (*)    Hgb urine dipstick SMALL (*)    Leukocytes,Ua LARGE (*)    Bacteria, UA RARE (*)    All other components within normal limits  URINE DRUG SCREEN - Abnormal; Notable for the following components:   Cocaine  POSITIVE (*)    All other components within normal limits  CBG MONITORING, ED - Abnormal; Notable for the following components:   Glucose-Capillary 288 (*)    All other components within normal limits  ETHANOL    EKG: EKG Interpretation Date/Time:  Friday April 16 2024 15:26:41 EST Ventricular Rate:  85 PR Interval:  154 QRS Duration:  74 QT Interval:  368 QTC Calculation: 437 R Axis:   -30  Text Interpretation: Normal sinus rhythm Left axis deviation Minimal voltage criteria for LVH, may be normal variant ( R in aVL ) Abnormal ECG No previous ECGs available Confirmed by Patt Alm DEL [phone]) on 04/16/2024 5:30:40 PM  Radiology: [facility] Hip Unilat W or Wo Pelvis 2-3 Views Left Result Date: 04/15/2024 CLINICAL DATA:  Clemens, recent surgery EXAM: DG HIP (WITH OR WITHOUT PELVIS) 2-3V LEFT COMPARISON:  04/13/2024 FINDINGS: Frontal view of the pelvis as well as frontal and frogleg lateral views of the left hip are obtained. Postsurgical changes from recent ORIF for a comminuted intertrochanteric left hip fracture. No evidence of orthopedic hardware failure or loosening. Stable alignment of the fracture fragments. No new fractures are identified. Mild symmetrical bilateral hip osteoarthritis. Stable postsurgical changes within the lumbar spine. IMPRESSION: 1. Stable ORIF of an intertrochanteric left hip fracture. No change in fracture alignment. 2.  No acute bony abnormalities. Electronically Signed   By: Ozell Daring M.D.   On: 04/15/2024 15:23     Procedures   Medications Ordered in the ED  acetaminophen  (TYLENOL ) tablet 1,000 mg (1,000 mg Oral Given 04/16/24 1814)  amLODipine  (NORVASC ) tablet  10 mg (10 mg Oral Given 04/16/24 1814)  aspirin  EC tablet 81 mg (has no administration in time range)  atorvastatin  (LIPITOR) tablet 40 mg (40 mg Oral Given 04/16/24 1815)  carvedilol  (COREG ) tablet 3.125 mg (3.125 mg Oral Given 04/16/24 1815)  cefUROXime  (CEFTIN ) tablet 500 mg (500 mg Oral Given 04/16/24 1816)                                    Medical Decision Making Rolfe Hartsell is a 64 y.o. male here presenting with persistent hip pain and suicidal ideation.  Will consult TTS regarding suicidal ideation.  Patient was seen here earlier today and diagnosed with UTI and was placed on cefuroxime .  Will check labs and urinalysis  8:43 PM Urinalysis showed large leuks.  Patient was continued on cefuroxime .  White count is normal.  Patient has significant pain in the left hip and requesting rehab.  Patient apparently had a wheelchair but does not have it with him currently.  Will have social work see the patient and PT consult  10:56 PM Psych saw the patient and states that patient is cleared from psychiatry standpoint.  I did touch base with social work and there is unfortunately no safe place for him to go to.  Patient will need to wait for physical therapy to see him in the morning and social work will follow along.   Amount and/or Complexity of Data Reviewed Labs: ordered.  Risk OTC drugs. Prescription drug management.     Final diagnoses:  None    ED Discharge Orders     None          Patt Alm Macho, MD 04/16/24 2258  "

## 2024-04-16 NOTE — ED Notes (Signed)
 PT belongsings at placed at nurses station

## 2024-04-16 NOTE — ED Notes (Signed)
 [facility] contacted, pending arrival.

## 2024-04-16 NOTE — ED Triage Notes (Signed)
 Pt. Arrives via ems for constipation and abdominal pain.

## 2024-04-16 NOTE — ED Provider Triage Note (Signed)
 Emergency Medicine Provider Triage Evaluation Note  David Wyatt , a 64 y.o. male  was evaluated in triage.  Pt complains of hip pain and suicidal thoughts.  Patient reports that he had surgery recently for his left hip.  He is now having pain in that hip.  Patient also reports that he cannot take it anymore and reports that he is having suicidal thoughts.  He denies any chest pain or shortness of breath.  He denies any other symptoms.  Review of Systems  Positive: Hip pain and suicidal ideations. Negative:   Physical Exam  BP (!) 149/88   Pulse 73   Temp 98 F (36.7 C)   Resp 17   SpO2 97%  Gen:   Awake, no distress   Resp:  Normal effort  MSK:   Moves extremities without difficulty  Other:  Patient was well-appearing  Medical Decision Making  Medically screening exam initiated at 3:14 PM.  Appropriate orders placed.  David Wyatt was informed that the remainder of the evaluation will be completed by another provider, this initial triage assessment does not replace that evaluation, and the importance of remaining in the ED until their evaluation is complete.     David Wyatt, [place] 04/16/24 [phone]

## 2024-04-16 NOTE — ED Notes (Signed)
 Pt does not have any SI/HI thoughts at this time. Pt was given crackers and water .

## 2024-04-16 NOTE — ED Notes (Signed)
 Pt. Dressed out in paper scrubs. 5 personal belonging bags with label on all bags. Belongings in wheelchair outside of room. Pt given white bag and diet drinks at this time.

## 2024-04-16 NOTE — ED Notes (Signed)
 Pt transported to RR via wheelchair by RN.

## 2024-04-16 NOTE — ED Notes (Signed)
 TTS monitor at bedside. NP to come on to speak to patient at 2000

## 2024-04-16 NOTE — ED Notes (Signed)
 Pt verbalizes trying to get to his case manager and SW at the [facility], don't want to miss my appointment. Assisted to front in w/c. Pt self transferred from stretcher to w/c with stand and pivot. Steady on feet. Given new bags for his belongings.

## 2024-04-16 NOTE — ED Notes (Signed)
 Pt resting/ sleeping, NAD, calm, repositions self, rise and fall of chest noted.

## 2024-04-16 NOTE — Congregational Nurse Program (Signed)
" °  Dept: [phone]   Congregational Nurse Program Note  Date of Encounter: 04/16/2024  Past Medical History: Past Medical History:  Diagnosis Date   Angioedema of lips 07/28/2012   left upper (07/29/2012)   Arthritis    hands and back   Auditory hallucination 01/23/2024   Chronic back pain    Cocaine  abuse (HCC)    Diabetic neuropathy (HCC)    High cholesterol    Hypertension    Neuropathy    Type II diabetes mellitus [facility])     Encounter Details:  Community Questionnaire - 04/16/24 1215       Questionnaire   Ask client: Do you give verbal consent for me to treat you today? Yes    Student Assistance N/A    Location Patient Served  [facility]    Encounter Setting Phone/Text/Email;CN site    Population Status Unhoused    Insurance Medicaid    Insurance/Financial Assistance Referral N/A    Medication Have Medication Insecurities    Medical Provider Yes    Medical Referrals Made NA    Medical Appointment Completed ED    Screenings CN Performed (remember to also record results) NA    CN Interventions Case Management;Navigate Healthcare System    ED Visit Averted N/A         RN received a call from Bri at [facility] stating the client was evaluated in the emergency department last night and again this morning. Client is currently without his wheelchair. RN contacted [facility] ED; staff reported they were unable to locate the wheelchair. RN also contacted the clients PCP to inquire about initiating the process for inpatient physical therapy. RN observed the client last evening at [facility] attempting to transfer from his wheelchair to a stretcher; the client demonstrated significant difficulty with the transfer. "

## 2024-04-16 NOTE — Progress Notes (Signed)
 [facility] Consult Note  Patient Name: David Wyatt MRN: [id number] DOB: [birth year] DATE OF Consult: 04/16/2024 Consult Order details:  Orders (From admission, onward)     Start     Ordered   04/16/24 1742  CONSULT TO CALL ACT TEAM       Ordering Provider: Patt Alm Macho, MD  Provider:  (Not yet assigned)  Question:  Reason for Consult?  Answer:  suicidal   04/16/24 1741            PRIMARY PSYCHIATRIC DIAGNOSES  1.   Adjustment disorder with depressed mood. 2.   Substance abuse, by history.   PSYCHIATRIC FORMULATION Based on my current evaluation and assessment of this patient, David Wyatt, David Wyatt, is a 64 year old male who presents with complaints of  suicidal ideation. The patient presentation is consistent with adjustment disorder with depressed mood.  The patient currently denies SI or HI.  The patient is a low risk for suicide due to he denies SI or HI, he contracts for safety and he is future oriented.  Therefore inpatient psych admission is not recommended.  The patient would benefit from outpatient psychiatric services after he is medically cleared or stable.   RECOMMENDATIONS  Recommendations: Medication recommendations: The patient declined restarting Cymbalta  and trazodone . Non-Medication/therapeutic recommendations: Supportive care, counseling There are no psychiatric contraindications to discharge at this time Plan Post Discharge/Psychiatric Care Follow-up resources yes Communication: Treatment team members (and family members if applicable) who were involved in treatment/care discussions and planning, and with whom we spoke or engaged with via secure text/chat, include the following: RN Thersia  I personally spent a total of 60 minutes in the care of the patient today including preparing to see the patient, getting/reviewing separately obtained history, performing a medically appropriate exam/evaluation, counseling and educating, placing orders, documenting  clinical information in the EHR, independently interpreting results, communicating results, and coordinating care.  Thank you for involving us  in the care of this patient. If you have any additional questions or concerns, please call [phone] and ask for me or the provider on-call.  TELEPSYCHIATRY ATTESTATION & CONSENT  As the provider for this telehealth consult, I attest that I verified the patients identity using two separate identifiers, introduced myself to the patient, provided my credentials, disclosed my location, and performed this encounter via a HIPAA-compliant, real-time, face-to-face, two-way, interactive audio and video platform and with the full consent and agreement of the patient (or guardian as applicable.)  Patient physical location: [facility] Telehealth provider physical location: home office in state of [place] .  Video start time: 1915 (Central Time) Video end time: 2015 (Central Time)   IDENTIFYING DATA  David Wyatt is a 64 y.o. year-old male for whom a psychiatric consultation has been ordered by the primary provider. The patient was identified using two separate identifiers.  CHIEF COMPLAINT/REASON FOR CONSULT  Suicidal ideation.   HISTORY OF PRESENT ILLNESS (HPI)   I evaluated the patient today face-to-face via secure, HIPAA-compliant telepsychiatric connection, and at the request of the primary treatment team. The reason for the telepsychiatric consultation is that the patient is a 64 y.o. male who presents with abdominal pain and later expressed thoughts of self-harm.  Currently during this psychiatric interview, the patient is very interactive, engaging and reports that they showed so much love.  Currently he denies SI or HI but states  I do not want to hurt  myself.  Reports that he feels great and denies symptoms of depression and or anxiety.  He reports that he feels supported and happy about the love he is receiving. The abdominal pain  has improved, and the patient can now use the bathroom. The patient has a history of depression, anxiety but does not remember the names of medications previously taken.  He denies current suicidal thoughts, anxiety, hallucinations, or paranoia. The patient denies any trauma in their life. He states that he has been clean from substances for a while.  However today his urine toxicology is positive for cocaine .   He reports that he is currently homeless with no family support.  He states that he has three grown children living in [place] .  He reports that he is separated, and are receiving SSI benefits. They report sleeping okay in the hospital and do not feel anger or irritability.    PAST PSYCHIATRIC HISTORY   Depression. Substance abuse  Otherwise as per HPI above.  PAST MEDICAL HISTORY  Past Medical History:  Diagnosis Date   Angioedema of lips 07/28/2012   left upper (07/29/2012)   Arthritis    hands and back   Auditory hallucination 01/23/2024   Chronic back pain    Cocaine  abuse (HCC)    Diabetic neuropathy (HCC)    High cholesterol    Hypertension    Neuropathy    Type II diabetes mellitus (HCC)      HOME MEDICATIONS  Facility Ordered Medications  Medication   [COMPLETED] docusate sodium  (COLACE) capsule 100 mg   acetaminophen  (TYLENOL ) tablet 1,000 mg   amLODipine  (NORVASC ) tablet 10 mg   aspirin  EC tablet 81 mg   atorvastatin  (LIPITOR) tablet 40 mg   carvedilol  (COREG ) tablet 3.125 mg   cefUROXime  (CEFTIN ) tablet 500 mg   PTA Medications  Medication Sig   folic acid  (FOLVITE ) 1 MG tablet Take 1 tablet (1 mg total) by mouth daily.   Multiple Vitamin (MULTIVITAMIN WITH MINERALS) TABS tablet Take 1 tablet by mouth daily. (Patient not taking: Reported on 03/07/2024)   thiamine  (VITAMIN B1) 100 MG tablet Take 1 tablet (100 mg total) by mouth daily.   insulin  aspart (NOVOLOG ) 100 UNIT/ML FlexPen Before each meal 3 times a day, If less than 150 do not give, 151-199 - 2  units, 200-250 - 6 units, 251-299 - 8 units,  300-349 - 10 units,  350 or above 12 units.   glucose blood (ACCU-CHEK GUIDE TEST) test strip Use as instructed to check blood sugar three times daily   Accu-Chek Softclix Lancets lancets Use as directed.   DULoxetine  (CYMBALTA ) 30 MG capsule Take 1 capsule (30 mg total) by mouth daily with breakfast.   Insulin  Pen Needle 32G X 4 MM MISC Use 1 each to inject insulin  into the skin 4 (four) times daily -  before meals and at bedtime.   metFORMIN  (GLUCOPHAGE ) 1000 MG tablet Take 1 tablet (1,000 mg total) by mouth 2 (two) times daily with a meal.   insulin  glargine (LANTUS ) 100 UNIT/ML Solostar Pen Inject 20 Units into the skin at bedtime. (Patient taking differently: Inject 15 Units into the skin at bedtime.)   ondansetron  (ZOFRAN -ODT) 4 MG disintegrating tablet Take 1 tablet (4 mg total) by mouth every 8 (eight) hours as needed for nausea or vomiting.   guaiFENesin  (ROBITUSSIN) 100 MG/5ML liquid Take 10 mLs by mouth every 8 (eight) hours.   glucose blood (ACCU-CHEK GUIDE TEST) test strip Use as instructed   docusate sodium  (COLACE) 100 MG capsule Take 1 capsule (100 mg total) by mouth 2 (two) times  daily.   nicotine  (NICODERM CQ  - DOSED IN MG/24 HOURS) 21 mg/24hr patch Place 1 patch (21 mg total) onto the skin daily.   acetaminophen  (TYLENOL ) 500 MG tablet Take 2 tablets (1,000 mg total) by mouth 3 (three) times daily for 7 days.   aspirin  EC 81 MG tablet Take 1 tablet (81 mg total) by mouth 2 (two) times daily. Swallow whole.   Oxycodone  HCl 10 MG TABS Take 1 tablet (10 mg total) by mouth every 6 (six) hours as needed for severe pain (pain score 7-10).   amLODipine  (NORVASC ) 10 MG tablet Take 1 tablet (10 mg total) by mouth daily.   atorvastatin  (LIPITOR) 40 MG tablet Take 1 tablet (40 mg total) by mouth daily.   carvedilol  (COREG ) 3.125 MG tablet Take 1 tablet (3.125 mg total) by mouth 2 (two) times daily with a meal.   isosorbide  mononitrate (IMDUR ) 30  MG 24 hr tablet Take 1 tablet (30 mg total) by mouth daily.   DULoxetine  (CYMBALTA ) 60 MG capsule Take 1 capsule (60 mg total) by mouth every evening.   traZODone  (DESYREL ) 50 MG tablet Take 1 tablet (50 mg total) by mouth at bedtime.   zolpidem  (AMBIEN ) 5 MG tablet Take 1 tablet (5 mg total) by mouth at bedtime.   polyethylene glycol powder (GLYCOLAX /MIRALAX ) 17 GM/SCOOP powder Take 17 g by mouth daily. Dissolve 1 capful (17g) in 4-8 ounces of liquid and take by mouth daily.   senna-docusate (SENOKOT-S) 8.6-50 MG tablet Take 1 tablet by mouth 2 (two) times daily.   tamsulosin  (FLOMAX ) 0.4 MG CAPS capsule Take 1 capsule (0.4 mg total) by mouth daily.   gabapentin  (NEURONTIN ) 300 MG capsule Take 1 capsule (300 mg total) by mouth 2 (two) times daily.   gabapentin  (NEURONTIN ) 400 MG capsule Take 1 capsule (400 mg total) by mouth at bedtime.   methocarbamol  (ROBAXIN ) 500 MG tablet Take 1-2 tablets (500-1,000 mg total) by mouth every 6 (six) hours as needed for up to 15 days for muscle spasms.   pregabalin  (LYRICA ) 100 MG capsule Take 1 capsule (100 mg total) by mouth 2 (two) times daily.   Nystatin (GERHARDT'S BUTT CREAM) CREA Apply 1 Application topically daily.   lidocaine  (LIDODERM ) 5 % Place 1 patch onto the skin daily. Remove & Discard patch within 12 hours or as directed by MD    ALLERGIES  Allergies[1]  SOCIAL & SUBSTANCE USE HISTORY  Social History   Socioeconomic History   Marital status: Divorced    Spouse name: Not on file   Number of children: 3   Years of education: 12   Highest education level: Not on file  Occupational History    Comment: disabled  Tobacco Use   Smoking status: Former    Current packs/day: 0.25    Average packs/day: 0.3 packs/day for 30.0 years (7.5 ttl pk-yrs)    Types: Cigarettes   Smokeless tobacco: Former    Quit date: 08/15/2015   Tobacco comments:    04/29/16 2  cigs daily, 10/27/17 sometimes < .25 PPD  Vaping Use   Vaping status: Never Used   Substance and Sexual Activity   Alcohol use: Not Currently   Drug use: Not Currently   Sexual activity: Not Currently  Other Topics Concern   Not on file  Social History Narrative   04/29/17   Lives in shelter   Caffeine- a lot of  tea, coffee   Social Drivers of Health   Tobacco Use: Medium Risk (04/16/2024)  Patient History    Smoking Tobacco Use: Former    Smokeless Tobacco Use: Former    Passive Exposure: Not on Actuary Strain: High Risk (01/15/2024)   Overall Financial Resource Strain (CARDIA)    Difficulty of Paying Living Expenses: Very hard  Food Insecurity: Food Insecurity Present (03/29/2024)   Epic    Worried About Programme Researcher, Broadcasting/film/video in the Last Year: Often true    Barista in the Last Year: Never true  Transportation Needs: Unmet Transportation Needs (03/15/2024)   Epic    Lack of Transportation (Medical): Yes    Lack of Transportation (Non-Medical): Yes  Physical Activity: Not on file  Stress: Not on file  Social Connections: Unknown (03/15/2024)   Social Connection and Isolation Panel    Frequency of Communication with Friends and Family: Not on file    Frequency of Social Gatherings with Friends and Family: Not on file    Attends Religious Services: Not on file    Active Member of Clubs or Organizations: Not on file    Attends Banker Meetings: Not on file    Marital Status: Divorced  Depression (PHQ2-9): Medium Risk (12/06/2023)   Depression (PHQ2-9)    PHQ-2 Score: 10  Alcohol Screen: Low Risk (12/07/2023)   Alcohol Screen    Last Alcohol Screening Score (AUDIT): 0  Housing: High Risk (03/15/2024)   Epic    Unable to Pay for Housing in the Last Year: Yes    Number of Times Moved in the Last Year: 1    Homeless in the Last Year: Yes  Utilities: At Risk (03/15/2024)   Epic    Threatened with loss of utilities: Yes  Health Literacy: Not on file   Tobacco Use History[2] Social History   Substance and Sexual Activity   Alcohol Use Not Currently   Social History   Substance and Sexual Activity  Drug Use Not Currently    Additional pertinent information  see HPI as above.  FAMILY HISTORY  Family History  Problem Relation Age of Onset   Diabetes Mother    Kidney disease Mother    Hyperlipidemia Mother    Hyperlipidemia Father    Diabetes Father    Family Psychiatric History (if known):   denies  MENTAL STATUS EXAM (MSE)  Mental Status Exam: General Appearance: Fairly Groomed  Orientation:  Full (Time, Place, and Person)  Memory:  Immediate;   Good Recent;   Good Remote;   Good  Concentration:  Concentration: Good and Attention Span: Good  Recall:  Good  Attention  Good  Eye Contact:  Good  Speech:  Clear and Coherent  Language:  Good  Volume:  Normal  Mood: 'Great  Affect:  Appropriate  Thought Process:  Goal Directed  Thought Content:  WDL  Suicidal Thoughts:  No  Homicidal Thoughts:  No  Judgement:  Good  Insight:  Good  Psychomotor Activity:  Normal  Akathisia:  No  Fund of Knowledge:  Good    Assets:  Communication Skills Desire for Improvement Resilience  Cognition:  WNL  ADL's:  Intact  AIMS (if indicated):       VITALS  Blood pressure (!) 149/88, pulse 73, temperature 98 F (36.7 C), resp. rate 17, SpO2 97%.  LABS  Admission on 04/16/2024  Component Date Value Ref Range Status   Sodium 04/16/2024 136  135 - 145 mmol/L Final   Potassium 04/16/2024 4.5  3.5 - 5.1 mmol/L Final  Chloride 04/16/2024 103  98 - 111 mmol/L Final   CO2 04/16/2024 23  22 - 32 mmol/L Final   Glucose, Bld 04/16/2024 335 (H)  70 - 99 mg/dL Final   Glucose reference range applies only to samples taken after fasting for at least 8 hours.   BUN 04/16/2024 21  8 - 23 mg/dL Final   Creatinine, Ser 04/16/2024 1.09  0.61 - 1.24 mg/dL Final   Calcium  04/16/2024 9.3  8.9 - 10.3 mg/dL Final   Total Protein 97/93/7973 7.8  6.5 - 8.1 g/dL Final   Albumin 97/93/7973 3.8  3.5 - 5.0 g/dL Final   AST  97/93/7973 30  15 - 41 U/L Final   ALT 04/16/2024 24  0 - 44 U/L Final   Alkaline Phosphatase 04/16/2024 185 (H)  38 - 126 U/L Final   Total Bilirubin 04/16/2024 0.4  0.0 - 1.2 mg/dL Final   GFR, Estimated 04/16/2024 >60  >60 mL/min Final   Comment: (NOTE) Calculated using the CKD-EPI Creatinine Equation (2021)    Anion gap 04/16/2024 11  5 - 15 Final   Performed at [facility], [address] [place], [place] [address]   WBC 04/16/2024 10.2  4.0 - 10.5 K/uL Final   RBC 04/16/2024 3.77 (L)  4.22 - 5.81 MIL/uL Final   Hemoglobin 04/16/2024 11.3 (L)  13.0 - 17.0 g/dL Final   HCT 97/93/7973 36.2 (L)  39.0 - 52.0 % Final   MCV 04/16/2024 96.0  80.0 - 100.0 fL Final   MCH 04/16/2024 30.0  26.0 - 34.0 pg Final   MCHC 04/16/2024 31.2  30.0 - 36.0 g/dL Final   RDW 97/93/7973 13.8  11.5 - 15.5 % Final   Platelets 04/16/2024 444 (H)  150 - 400 K/uL Final   nRBC 04/16/2024 0.0  0.0 - 0.2 % Final   Neutrophils Relative % 04/16/2024 68  % Final   Neutro Abs 04/16/2024 7.0  1.7 - 7.7 K/uL Final   Lymphocytes Relative 04/16/2024 20  % Final   Lymphs Abs 04/16/2024 2.0  0.7 - 4.0 K/uL Final   Monocytes Relative 04/16/2024 8  % Final   Monocytes Absolute 04/16/2024 0.8  0.1 - 1.0 K/uL Final   Eosinophils Relative 04/16/2024 3  % Final   Eosinophils Absolute 04/16/2024 0.3  0.0 - 0.5 K/uL Final   Basophils Relative 04/16/2024 1  % Final   Basophils Absolute 04/16/2024 0.1  0.0 - 0.1 K/uL Final   Immature Granulocytes 04/16/2024 0  % Final   Abs Immature Granulocytes 04/16/2024 0.04  0.00 - 0.07 K/uL Final   Performed at [facility], [address], [place], [place] [address]   Color, Urine 04/16/2024 YELLOW  YELLOW Final   APPearance 04/16/2024 HAZY (A)  CLEAR Final   Specific Gravity, Urine 04/16/2024 1.021  1.005 - 1.030 Final   pH 04/16/2024 7.0  5.0 - 8.0 Final   Glucose, UA 04/16/2024 >=500 (A)  NEGATIVE mg/dL Final   Hgb urine dipstick 04/16/2024 SMALL (A)  NEGATIVE Final    Bilirubin Urine 04/16/2024 NEGATIVE  NEGATIVE Final   Ketones, ur 04/16/2024 NEGATIVE  NEGATIVE mg/dL Final   Protein, ur 97/93/7973 NEGATIVE  NEGATIVE mg/dL Final   Nitrite 97/93/7973 NEGATIVE  NEGATIVE Final   Leukocytes,Ua 04/16/2024 LARGE (A)  NEGATIVE Final   RBC / HPF 04/16/2024 0-5  0 - 5 RBC/hpf Final   WBC, UA 04/16/2024 >50  0 - 5 WBC/hpf Final   Bacteria, UA 04/16/2024 RARE (  A)  NONE SEEN Final   Squamous Epithelial / HPF 04/16/2024 0-5  0 - 5 /HPF Final   Mucus 04/16/2024 PRESENT   Final   Performed at [facility], [address], [place], [place] [address]   Opiates 04/16/2024 NEGATIVE  NEGATIVE Final   Cocaine  04/16/2024 POSITIVE (A)  NEGATIVE Final   Benzodiazepines 04/16/2024 NEGATIVE  NEGATIVE Final   Amphetamines 04/16/2024 NEGATIVE  NEGATIVE Final   Tetrahydrocannabinol 04/16/2024 NEGATIVE  NEGATIVE Final   Barbiturates 04/16/2024 NEGATIVE  NEGATIVE Final   Methadone Scn, Ur 04/16/2024 NEGATIVE  NEGATIVE Final   Fentanyl  04/16/2024 NEGATIVE  NEGATIVE Final   Comment: (NOTE) Drug screen is for Medical Purposes only. Positive results are preliminary only. If confirmation is needed, notify lab within 5 days.  Drug Class                 Cutoff (ng/mL) Amphetamine and metabolites 1000 Barbiturate and metabolites 200 Benzodiazepine              200 Opiates and metabolites     300 Cocaine  and metabolites     300 THC                         50 Fentanyl                     5 Methadone                   300  Trazodone  is metabolized in vivo to several metabolites,  including pharmacologically active m-CPP, which is excreted in the  urine.  Immunoassay screens for amphetamines and MDMA have potential  cross-reactivity with these compounds and may provide false positive  result.  Performed at [facility], [address] [place], [place] [address]    Alcohol, Ethyl (B) 04/16/2024 <15  <15 mg/dL Final   Comment: (NOTE) For medical purposes  only. Performed at [facility], [address], [place], [place] [address]    Glucose-Capillary 04/16/2024 288 (H)  70 - 99 mg/dL Final   Glucose reference range applies only to samples taken after fasting for at least 8 hours.   Comment 1 04/16/2024 Notify RN   Final   Comment 2 04/16/2024 Document in Chart   Final  Admission on 04/16/2024, Discharged on 04/16/2024  Component Date Value Ref Range Status   Color, Urine 04/16/2024 YELLOW  YELLOW Final   APPearance 04/16/2024 HAZY (A)  CLEAR Final   Specific Gravity, Urine 04/16/2024 1.010  1.005 - 1.030 Final   pH 04/16/2024 8.0  5.0 - 8.0 Final   Glucose, UA 04/16/2024 >=500 (A)  NEGATIVE mg/dL Final   Hgb urine dipstick 04/16/2024 MODERATE (A)  NEGATIVE Final   Bilirubin Urine 04/16/2024 NEGATIVE  NEGATIVE Final   Ketones, ur 04/16/2024 NEGATIVE  NEGATIVE mg/dL Final   Protein, ur 97/93/7973 30 (A)  NEGATIVE mg/dL Final   Nitrite 97/93/7973 NEGATIVE  NEGATIVE Final   Leukocytes,Ua 04/16/2024 LARGE (A)  NEGATIVE Final   RBC / HPF 04/16/2024 21-50  0 - 5 RBC/hpf Final   WBC, UA 04/16/2024 >50  0 - 5 WBC/hpf Final   Bacteria, UA 04/16/2024 FEW (A)  NONE SEEN Final   Squamous Epithelial / HPF 04/16/2024 0-5  0 - 5 /HPF Final   WBC Clumps 04/16/2024 PRESENT   Final   Mucus 04/16/2024 PRESENT   Final   Triple Phosphate Crystal 04/16/2024 PRESENT  Final   Performed at [facility], [address], [place], [place] [address]   Glucose-Capillary 04/16/2024 183 (H)  70 - 99 mg/dL Final   Glucose reference range applies only to samples taken after fasting for at least 8 hours.    PSYCHIATRIC REVIEW OF SYSTEMS (ROS)  ROS: Notable for the following relevant positive findings: ROS  Additional findings:      Musculoskeletal: No abnormal movements observed      Gait & Station: Laying/Sitting      Pain Screening: Present - mild to moderate      Nutrition & Dental Concerns: Decrease in food intake and/or loss of  appetite  RISK FORMULATION/ASSESSMENT  Is the patient experiencing any suicidal or homicidal ideations: No       Explain if yes:  Protective factors considered for safety management:  he is future oriented, he contracts for safety  Risk factors/concerns considered for safety management:  Depression Substance abuse/dependence Physical illness/chronic pain Age over 24 Male gender Unmarried  Is there a safety management plan with the patient and treatment team to minimize risk factors and promote protective factors: Yes           Explain:  medical and supportive care.  Is crisis care placement or psychiatric hospitalization recommended: No     Based on my current evaluation and risk assessment, patient is determined at this time to be at:  Low risk  *RISK ASSESSMENT Risk assessment is a dynamic process; it is possible that this patient's condition, and risk level, may change. This should be re-evaluated and managed over time as appropriate. Please re-consult psychiatric consult services if additional assistance is needed in terms of risk assessment and management. If your team decides to discharge this patient, please advise the patient how to best access emergency psychiatric services, or to call 911, if their condition worsens or they feel unsafe in any way.   David Pfiester, NP Telepsychiatry Consult Services    [1]  Allergies Allergen Reactions   Lisinopril  Anaphylaxis, Swelling and Other (See Comments)    Lip Swelling and angioedema, too   [2]  Social History Tobacco Use  Smoking Status Former   Current packs/day: 0.25   Average packs/day: 0.3 packs/day for 30.0 years (7.5 ttl pk-yrs)   Types: Cigarettes  Smokeless Tobacco Former   Quit date: 08/15/2015  Tobacco Comments   04/29/16 2  cigs daily, 10/27/17 sometimes < .25 PPD

## 2024-04-16 NOTE — ED Notes (Addendum)
 Patient endorses he has no active thoughts of killing himself but he feels like sometimes he would be better off gone due to unstable housing. Patient with significant pain to left hip with decreased mobility. Patient endorses he wishes he could go to short term rehab for mobility assistance. Patient oriented x4 at time of triage.

## 2024-04-16 NOTE — Telephone Encounter (Signed)
 Copied from CRM [id number]. Topic: General - Other >> Apr 16, 2024 12:41 PM   Myrick T wrote:  Reason for CRM: Mitzie the [facility] nurse called stated she met with patient last night and he is not able to walk. She believes this has been going since he left the hospital in which he has not had PT since then. Mitzie is requesting inpatient PT. Please f/u with her at [phone]

## 2024-04-16 NOTE — ED Triage Notes (Signed)
 Pt here from [facility] with chronic hip pain , pt was just seen at [facility] earlier  today

## 2024-04-20 ENCOUNTER — Ambulatory Visit: Payer: MEDICAID | Admitting: Family Medicine

## 2024-04-21 ENCOUNTER — Encounter: Payer: Self-pay | Admitting: Physician Assistant

## 2024-05-11 IMAGING — CT CT RENAL STONE PROTOCOL
2 of 4 series · 15 of 46 positions shown, 17 images · non-contrast
Comparison: CT of the abdomen and pelvis 06/22/2021.

CLINICAL DATA: 60-year-old male with history of suprapubic pain.
Hematuria.



[Series 2: axial st · axial · 0.96mm/px · z∈[-496,-86]mm · 12 of 96 slices shown, 14 images]
[im 7/96  soft-tissue]
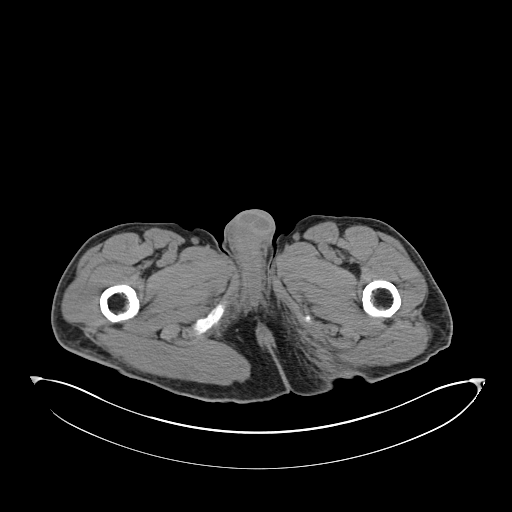
[im 7/96  bone]
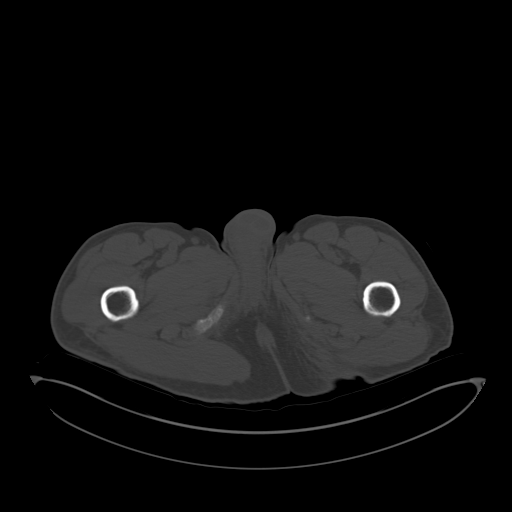
[im 13/96  soft-tissue]
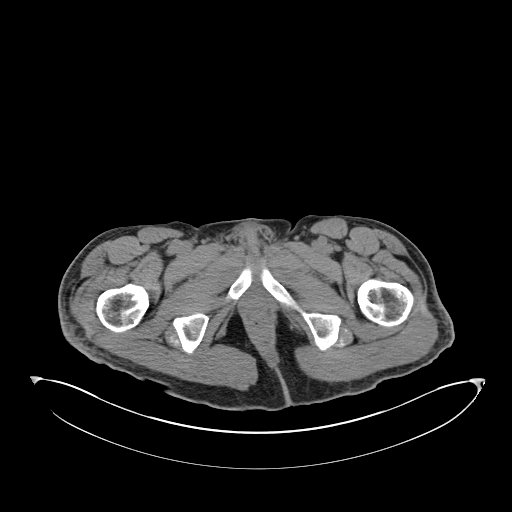
[im 20/96  soft-tissue]
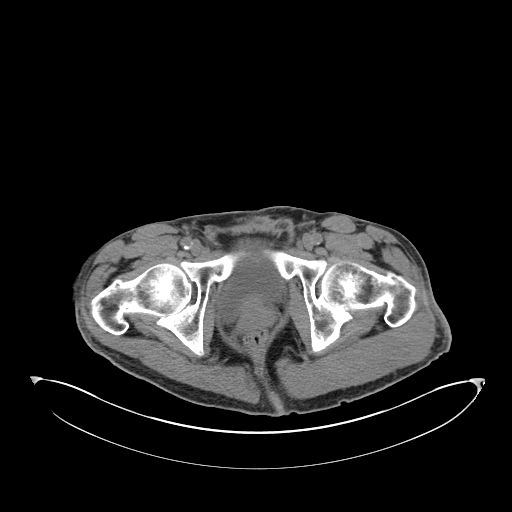
[im 32/96  soft-tissue]
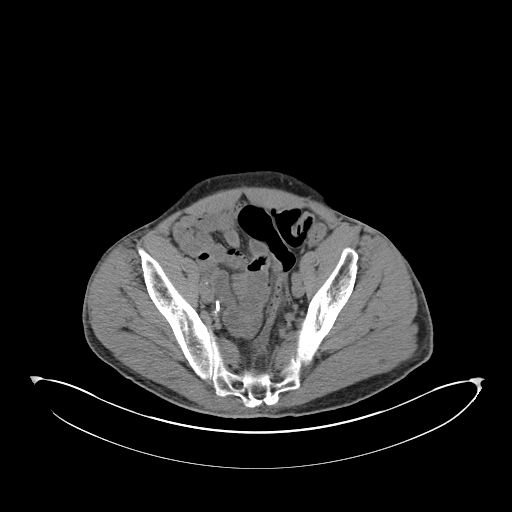
[im 39/96  soft-tissue]
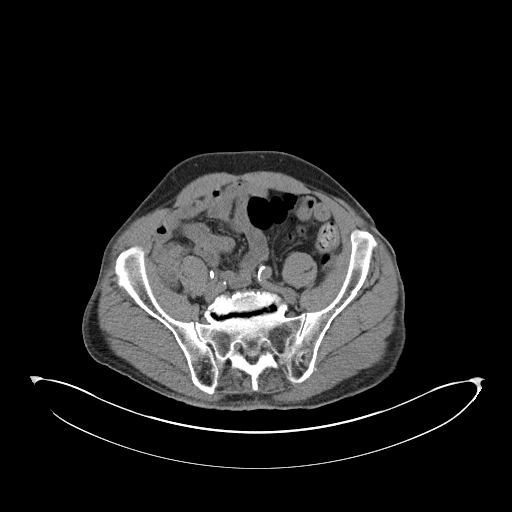
[im 45/96  soft-tissue]
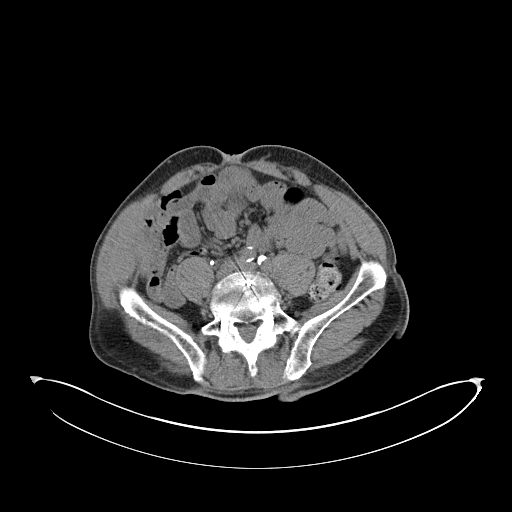
[im 51/96  soft-tissue]
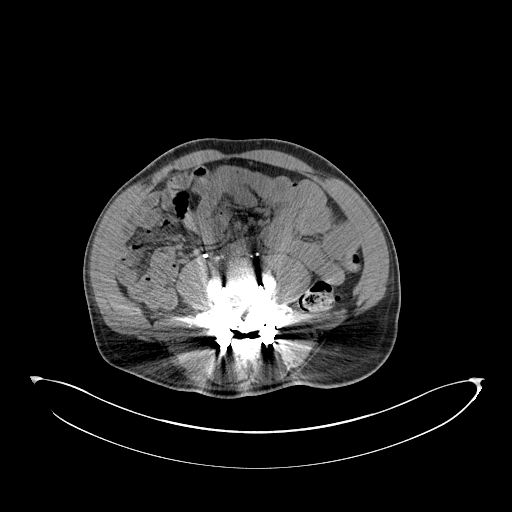
[im 58/96  soft-tissue]
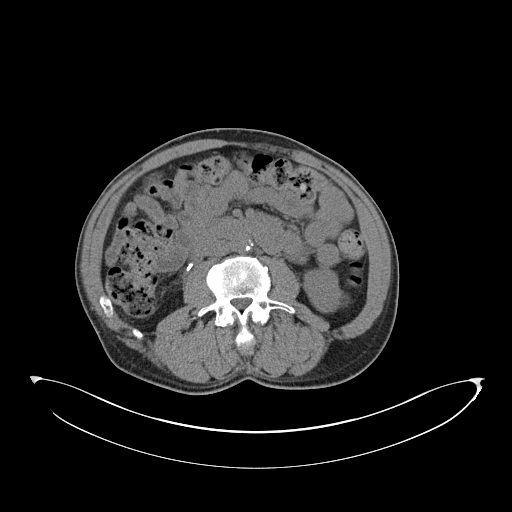
[im 64/96  soft-tissue]
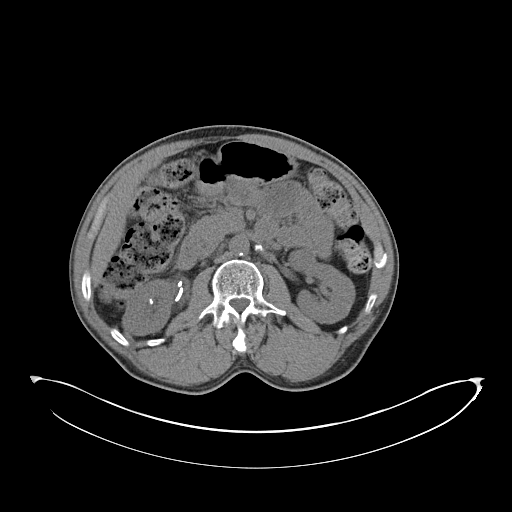
[im 64/96  bone]
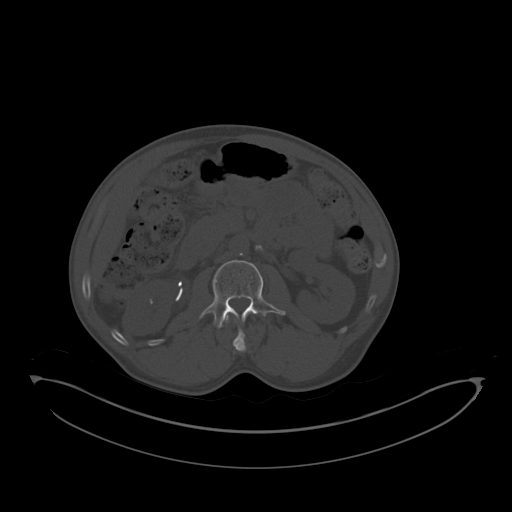
[im 77/96  soft-tissue]
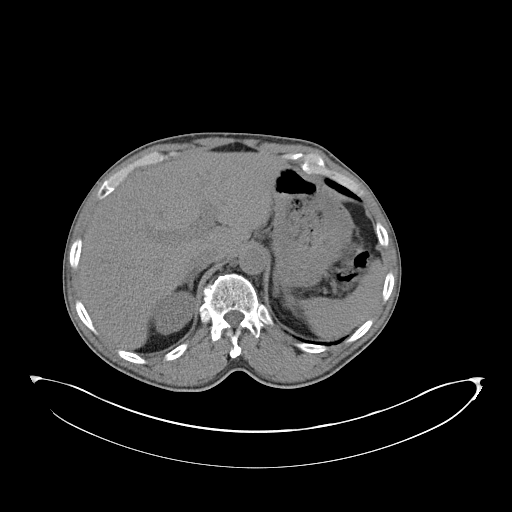
[im 83/96  soft-tissue]
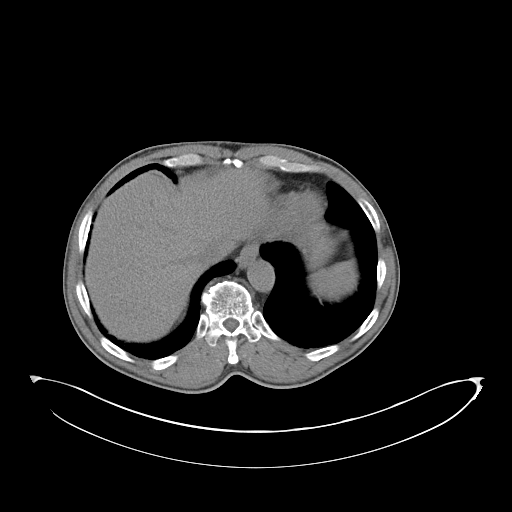
[im 89/96  soft-tissue]
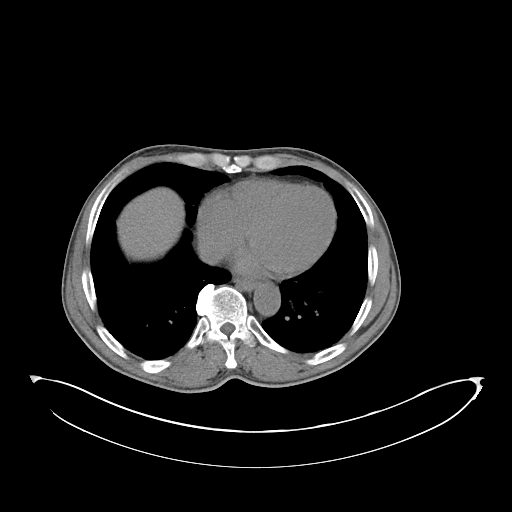

[Series 4: coronal · coronal · 0.78mm/px · 3 of 142 slices shown]
[im 48/142  soft-tissue]
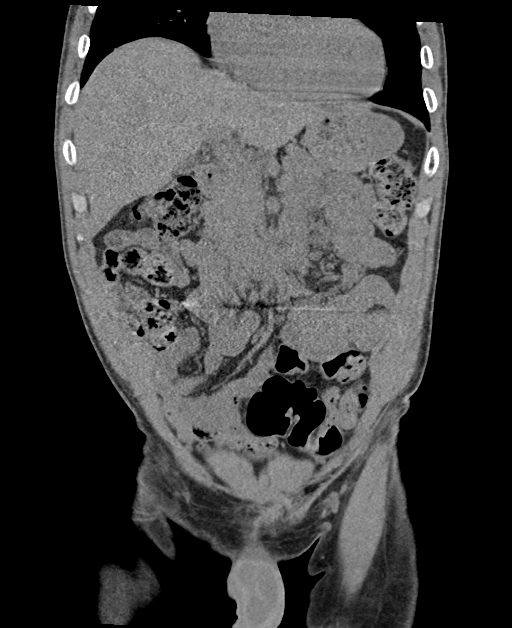
[im 63/142  soft-tissue]
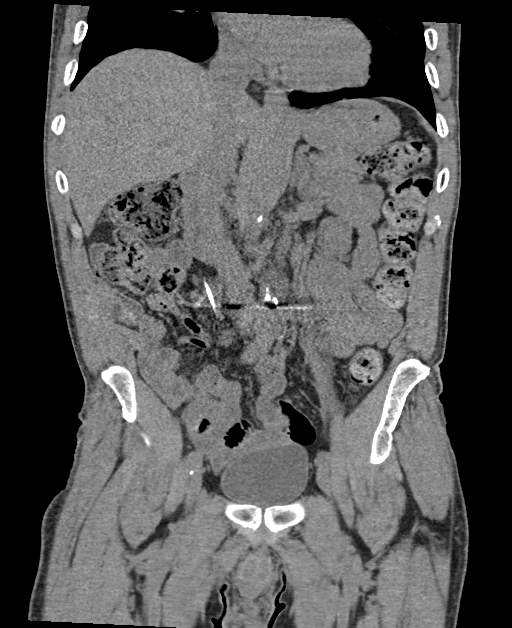
[im 79/142  soft-tissue]
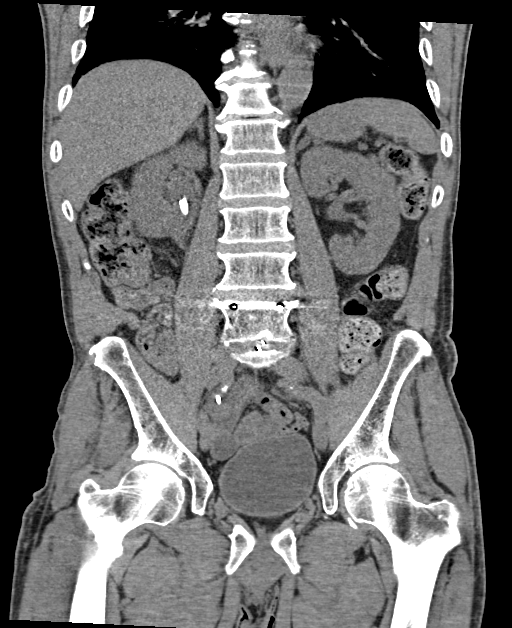

[15 of 46 positions shown; findings below may reference images not displayed]

FINDINGS: Lower chest: Mild calcification of the aortic valve. Mild scarring
in the lung bases bilaterally.

Hepatobiliary: No suspicious cystic or solid hepatic lesions are
confidently identified on today's noncontrast CT examination.
Unenhanced appearance of the gallbladder is unremarkable.

Pancreas: No definite pancreatic mass or peripancreatic fluid
collections or inflammatory changes are noted on today's noncontrast
CT examination.

Spleen: Unremarkable.

Adrenals/Urinary Tract: Nonobstructive calculi are noted within the
collecting systems of both kidneys measuring up to 4 mm in the lower
pole collecting system of the right kidney. Small cortical
calcification benign in appearance in the lateral aspect of the
interpolar region of the left kidney also noted (unchanged). Right
double-J ureteral stent with proximal loop reformed in the right
renal pelvis and distal loop reformed in the urinary bladder. Mild
fullness of the right renal pelvis, similar to the prior study. No
frank hydroureter. No calcifications are confidently identified
along the course of either ureter or within the lumen of the urinary
bladder. No left hydroureteronephrosis. Mild adreniform thickening
of the left adrenal gland, unchanged, likely reflective of adrenal
hyperplasia. Right adrenal gland is normal in appearance. Urinary
bladder is otherwise unremarkable in appearance.

Stomach/Bowel: Unenhanced appearance of the stomach is normal. No
pathologic dilatation of small bowel or colon. Normal appendix.

Vascular/Lymphatic: Aortic atherosclerosis. No lymphadenopathy noted
in the abdomen or pelvis.

Reproductive: Prostate gland and seminal vesicles are unremarkable
in appearance.

Other: No significant volume of ascites.  No pneumoperitoneum.

Musculoskeletal: Status post PLIF at L4-L5. There are no aggressive
appearing lytic or blastic lesions noted in the visualized portions
of the skeleton.
IMPRESSION: 1. Nonobstructive calculi are noted in the collecting systems of
both kidneys measuring up to 4 mm in the lower pole collecting
system of the right kidney.
2. No other acute findings are noted to account for the patient's
symptoms.
3. Right-sided double-J ureteral stent appears appropriately
located. Persistent mild fullness in the right renal pelvis is
unchanged.
4. Aortic atherosclerosis.
5. Additional incidental findings, as above.
